# Patient Record
Sex: Female | Born: 1970 | Race: White | Hispanic: No | Smoking: Never smoker
Health system: Southern US, Community
[De-identification: ages and names within clinical notes are randomized; demographics above are authoritative.]

## PROBLEM LIST (undated history)

## (undated) DIAGNOSIS — M5441 Lumbago with sciatica, right side: Secondary | ICD-10-CM

## (undated) DIAGNOSIS — F418 Other specified anxiety disorders: Secondary | ICD-10-CM

## (undated) DIAGNOSIS — F1110 Opioid abuse, uncomplicated: Secondary | ICD-10-CM

## (undated) DIAGNOSIS — B1920 Unspecified viral hepatitis C without hepatic coma: Secondary | ICD-10-CM

## (undated) DIAGNOSIS — M7022 Olecranon bursitis, left elbow: Secondary | ICD-10-CM

## (undated) DIAGNOSIS — K7460 Unspecified cirrhosis of liver: Secondary | ICD-10-CM

## (undated) DIAGNOSIS — Z780 Asymptomatic menopausal state: Secondary | ICD-10-CM

## (undated) DIAGNOSIS — F319 Bipolar disorder, unspecified: Secondary | ICD-10-CM

## (undated) DIAGNOSIS — Z9289 Personal history of other medical treatment: Secondary | ICD-10-CM

## (undated) DIAGNOSIS — Z789 Other specified health status: Secondary | ICD-10-CM

## (undated) DIAGNOSIS — Z7289 Other problems related to lifestyle: Secondary | ICD-10-CM

## (undated) DIAGNOSIS — Z862 Personal history of diseases of the blood and blood-forming organs and certain disorders involving the immune mechanism: Secondary | ICD-10-CM

## (undated) DIAGNOSIS — K7031 Alcoholic cirrhosis of liver with ascites: Secondary | ICD-10-CM

## (undated) DIAGNOSIS — K852 Alcohol induced acute pancreatitis without necrosis or infection: Secondary | ICD-10-CM

## (undated) DIAGNOSIS — I8500 Esophageal varices without bleeding: Secondary | ICD-10-CM

## (undated) DIAGNOSIS — R45851 Suicidal ideations: Secondary | ICD-10-CM

## (undated) DIAGNOSIS — S0990XA Unspecified injury of head, initial encounter: Secondary | ICD-10-CM

## (undated) DIAGNOSIS — I8510 Secondary esophageal varices without bleeding: Secondary | ICD-10-CM

## (undated) DIAGNOSIS — F32A Depression, unspecified: Secondary | ICD-10-CM

## (undated) DIAGNOSIS — D696 Thrombocytopenia, unspecified: Secondary | ICD-10-CM

## (undated) DIAGNOSIS — Z87442 Personal history of urinary calculi: Secondary | ICD-10-CM

## (undated) DIAGNOSIS — F209 Schizophrenia, unspecified: Secondary | ICD-10-CM

## (undated) DIAGNOSIS — G43909 Migraine, unspecified, not intractable, without status migrainosus: Secondary | ICD-10-CM

## (undated) DIAGNOSIS — K920 Hematemesis: Secondary | ICD-10-CM

## (undated) DIAGNOSIS — F329 Major depressive disorder, single episode, unspecified: Secondary | ICD-10-CM

## (undated) DIAGNOSIS — F1020 Alcohol dependence, uncomplicated: Secondary | ICD-10-CM

## (undated) DIAGNOSIS — R569 Unspecified convulsions: Secondary | ICD-10-CM

## (undated) DIAGNOSIS — K219 Gastro-esophageal reflux disease without esophagitis: Secondary | ICD-10-CM

## (undated) DIAGNOSIS — M5442 Lumbago with sciatica, left side: Secondary | ICD-10-CM

## (undated) DIAGNOSIS — F419 Anxiety disorder, unspecified: Secondary | ICD-10-CM

## (undated) HISTORY — PX: BUNIONECTOMY: SHX129

## (undated) HISTORY — PX: TUBAL LIGATION: SHX77

## (undated) HISTORY — PX: FINGER FRACTURE SURGERY: SHX638

## (undated) HISTORY — DX: Other problems related to lifestyle: Z72.89

## (undated) HISTORY — PX: SHOULDER OPEN ROTATOR CUFF REPAIR: SHX2407

## (undated) HISTORY — PX: VAGINAL HYSTERECTOMY: SUR661

## (undated) HISTORY — DX: Other specified health status: Z78.9

## (undated) HISTORY — PX: FRACTURE SURGERY: SHX138

## (undated) HISTORY — DX: Personal history of diseases of the blood and blood-forming organs and certain disorders involving the immune mechanism: Z86.2

## (undated) MED FILL — Romiplostim For Inj 250 MCG: SUBCUTANEOUS | Qty: 0.5 | Status: AC

## (undated) MED FILL — Ferumoxytol Inj 510 MG/17ML (30 MG/ML) (Elemental Fe): INTRAVENOUS | Qty: 17 | Status: AC

## (undated) MED FILL — Romiplostim For Inj 250 MCG: SUBCUTANEOUS | Qty: 0.75 | Status: AC

## (undated) MED FILL — Romiplostim For Inj 250 MCG: SUBCUTANEOUS | Qty: 0.38 | Status: AC

## (undated) MED FILL — Romiplostim For Inj 500 MCG: SUBCUTANEOUS | Qty: 0.75 | Status: AC

## (undated) MED FILL — Romiplostim For Inj 250 MCG: SUBCUTANEOUS | Qty: 0.39 | Status: AC

## (undated) MED FILL — Romiplostim For Inj 125 MCG: SUBCUTANEOUS | Qty: 0.75 | Status: AC

---
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
## 2014-08-28 DIAGNOSIS — R1084 Generalized abdominal pain: Secondary | ICD-10-CM

## 2014-08-28 NOTE — ED Notes (Signed)
Patient reports vomiting all day.  Chest pain started about noon.  Pain in left upper chest and is throbbing in nature.  Reports abuses ETOH and none since last night.  Diagnosed with cirrhosis and Hep C earlier this year.  States she was also told she has liver and pancreatic CA, but never went for treatment.  Anxious, shaking, tachycardic, and hypertensive in triage.  States has not taken seizure medication because she left it in [place], where she lives.  Admits does not take seizure medication regularly.

## 2014-08-29 ENCOUNTER — Inpatient Hospital Stay
Admit: 2014-08-29 | Discharge: 2014-08-29 | Disposition: A | Discharge status: Home / Self Care | Payer: Self-pay | Attending: Emergency Medicine

## 2014-08-29 ENCOUNTER — Emergency Department: Admit: 2014-08-29 | Payer: Self-pay

## 2014-08-29 LAB — CBC WITH AUTOMATED DIFF
ABS. BASOPHILS: 0 10*3/uL (ref 0.0–0.2)
ABS. EOSINOPHILS: 0 10*3/uL (ref 0.0–0.8)
ABS. IMM. GRANS.: 0 10*3/uL (ref 0.0–0.5)
ABS. LYMPHOCYTES: 0.5 10*3/uL (ref 0.5–4.6)
ABS. MONOCYTES: 0.4 10*3/uL (ref 0.1–1.3)
ABS. NEUTROPHILS: 3.8 10*3/uL (ref 1.7–8.2)
BASOPHILS: 0 % (ref 0.0–2.0)
EOSINOPHILS: 0 % — ABNORMAL LOW (ref 0.5–7.8)
HCT: 47.6 % — ABNORMAL HIGH (ref 35.8–46.3)
HGB: 16.3 g/dL — ABNORMAL HIGH (ref 11.7–15.4)
IMMATURE GRANULOCYTES: 0.2 % (ref 0.0–5.0)
LYMPHOCYTES: 10 % — ABNORMAL LOW (ref 13–44)
MCH: 35 PG — ABNORMAL HIGH (ref 26.1–32.9)
MCHC: 34.2 g/dL (ref 31.4–35.0)
MCV: 102.1 FL — ABNORMAL HIGH (ref 79.6–97.8)
MONOCYTES: 9 % (ref 4.0–12.0)
MPV: 12.9 FL (ref 10.8–14.1)
NEUTROPHILS: 81 % — ABNORMAL HIGH (ref 43–78)
PLATELET: 27 10*3/uL — CL (ref 150–450)
RBC: 4.66 M/uL (ref 4.05–5.25)
RDW: 13.2 % (ref 11.9–14.6)
WBC: 4.7 10*3/uL (ref 4.3–11.1)

## 2014-08-29 LAB — EKG, 12 LEAD, INITIAL
Atrial Rate: 104 {beats}/min
Calculated P Axis: 87 degrees
Calculated R Axis: 86 degrees
Calculated T Axis: 74 degrees
P-R Interval: 126 ms
Q-T Interval: 508 ms
QRS Duration: 74 ms
QTC Calculation (Bezet): 668 ms
Ventricular Rate: 104 {beats}/min

## 2014-08-29 LAB — DRUG SCREEN, URINE
AMPHETAMINES: NEGATIVE
BARBITURATES: NEGATIVE
BENZODIAZEPINES: NEGATIVE
COCAINE: POSITIVE
METHADONE: NEGATIVE
OPIATES: NEGATIVE
PCP(PHENCYCLIDINE): NEGATIVE
THC (TH-CANNABINOL): NEGATIVE

## 2014-08-29 LAB — HCG URINE, QL: HCG urine, QL: NEGATIVE

## 2014-08-29 LAB — URINALYSIS W/ RFLX MICROSCOPIC
Glucose: NEGATIVE mg/dL
Ketone: 15 mg/dL — AB
Nitrites: NEGATIVE
Protein: 30 mg/dL — AB
Specific gravity: 1.019 (ref 1.001–1.023)
Urobilinogen: 1 EU/dL (ref 0.2–1.0)
pH (UA): 7.5 (ref 5.0–9.0)

## 2014-08-29 LAB — METABOLIC PANEL, COMPREHENSIVE
A-G Ratio: 0.9 — ABNORMAL LOW (ref 1.2–3.5)
ALT (SGPT): 172 U/L — ABNORMAL HIGH (ref 12–65)
AST (SGOT): 380 U/L — ABNORMAL HIGH (ref 15–37)
Albumin: 4.5 g/dL (ref 3.5–5.0)
Alk. phosphatase: 189 U/L — ABNORMAL HIGH (ref 50–136)
Anion gap: 13 mmol/L (ref 7–16)
BUN: 6 MG/DL (ref 6–23)
Bilirubin, total: 2.6 MG/DL — ABNORMAL HIGH (ref 0.2–1.1)
CO2: 29 mmol/L (ref 21–32)
Calcium: 8.1 MG/DL — ABNORMAL LOW (ref 8.3–10.4)
Chloride: 97 mmol/L — ABNORMAL LOW (ref 98–107)
Creatinine: 0.67 MG/DL (ref 0.6–1.0)
GFR est AA: >60 mL/min/{1.73_m2} (ref >60)
GFR est non-AA: >60 mL/min/{1.73_m2} (ref >60)
Globulin: 5 g/dL — ABNORMAL HIGH (ref 2.3–3.5)
Glucose: 146 mg/dL — ABNORMAL HIGH (ref 65–100)
Potassium: 3 mmol/L — ABNORMAL LOW (ref 3.5–5.1)
Protein, total: 9.5 g/dL — ABNORMAL HIGH (ref 6.3–8.2)
Sodium: 139 mmol/L (ref 136–145)

## 2014-08-29 LAB — ETHYL ALCOHOL: ALCOHOL(ETHYL),SERUM: <3 MG/DL

## 2014-08-29 LAB — MAGNESIUM: Magnesium: 1 mg/dL — CL (ref 1.8–2.4)

## 2014-08-29 LAB — LIPASE: Lipase: 111 U/L (ref 73–393)

## 2014-08-29 LAB — POC TROPONIN: Troponin-I (POC): 0 ng/ml (ref 0.0–0.08)

## 2014-08-29 MED ORDER — LORAZEPAM 2 MG/ML IJ SOLN
2 mg/mL | INTRAMUSCULAR | Status: AC
Start: 2014-08-29 — End: 2014-08-29
  Administered 2014-08-29: 07:00:00 via INTRAVENOUS

## 2014-08-29 MED ORDER — SODIUM CHLORIDE 0.9% BOLUS IV
0.9 % | Freq: Once | INTRAVENOUS | Status: AC
Start: 2014-08-29 — End: 2014-08-29
  Administered 2014-08-29: 06:00:00 via INTRAVENOUS

## 2014-08-29 MED ORDER — LORAZEPAM 1 MG TAB
1 mg | ORAL_TABLET | Freq: Three times a day (TID) | ORAL | Status: AC | PRN
Start: 2014-08-29 — End: ?

## 2014-08-29 MED ORDER — MAGNESIUM SULFATE 4 GRAM/100 ML IV PIGGY BACK
4 gram/100 mL ( %) | Freq: Once | INTRAVENOUS | Status: AC
Start: 2014-08-29 — End: 2014-08-29
  Administered 2014-08-29: 07:00:00 via INTRAVENOUS

## 2014-08-29 MED ORDER — SODIUM CHLORIDE 0.9 % IJ SYRG
INTRAMUSCULAR | Status: DC | PRN
Start: 2014-08-29 — End: 2014-08-29

## 2014-08-29 MED ORDER — LORAZEPAM 2 MG/ML IJ SOLN
2 mg/mL | INTRAMUSCULAR | Status: DC
Start: 2014-08-29 — End: 2014-08-29

## 2014-08-29 MED ORDER — ONDANSETRON (PF) 4 MG/2 ML INJECTION
4 mg/2 mL | INTRAMUSCULAR | Status: AC
Start: 2014-08-29 — End: 2014-08-29
  Administered 2014-08-29: 06:00:00 via INTRAVENOUS

## 2014-08-29 MED ORDER — MAGNESIUM OXIDE 400 MG TAB
400 mg | ORAL_TABLET | Freq: Every day | ORAL | Status: AC
Start: 2014-08-29 — End: ?

## 2014-08-29 MED ORDER — HYOSCYAMINE 0.125 MG SUBLINGUAL TAB
0.125 mg | SUBLINGUAL | Status: AC
Start: 2014-08-29 — End: 2014-08-29
  Administered 2014-08-29: 06:00:00 via SUBLINGUAL

## 2014-08-29 MED ORDER — SODIUM CHLORIDE 0.9 % IJ SYRG
Freq: Three times a day (TID) | INTRAMUSCULAR | Status: DC
Start: 2014-08-29 — End: 2014-08-29

## 2014-08-29 MED ORDER — PANTOPRAZOLE 40 MG IV SOLR
40 mg | Freq: Once | INTRAVENOUS | Status: AC
Start: 2014-08-29 — End: 2014-08-29
  Administered 2014-08-29: 06:00:00 via INTRAVENOUS

## 2014-08-29 MED FILL — LORAZEPAM 2 MG/ML IJ SOLN: 2 mg/mL | INTRAMUSCULAR | Qty: 1

## 2014-08-29 MED FILL — MAGNESIUM SULFATE 4 GRAM/100 ML IV PIGGY BACK: 4 gram/100 mL ( %) | INTRAVENOUS | Qty: 100

## 2014-08-29 MED FILL — SODIUM CHLORIDE 0.9 % INJECTION: INTRAMUSCULAR | Qty: 10

## 2014-08-29 MED FILL — ONDANSETRON (PF) 4 MG/2 ML INJECTION: 4 mg/2 mL | INTRAMUSCULAR | Qty: 2

## 2014-08-29 MED FILL — HYOSCYAMINE 0.125 MG SUBLINGUAL TAB: 0.125 mg | SUBLINGUAL | Qty: 1

## 2014-08-29 NOTE — ED Notes (Signed)
Report given to Erin RN. Care transferred at this time

## 2014-08-29 NOTE — ED Provider Notes (Signed)
HPI Comments: 43 year old female complaining of nausea vomiting epigastric and chest pain.  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  She's been drinking today however has been throwing up most of what she drinks.  She is having some epigastric chest pain after the vomiting.  Patient is from [place] and has not been here before.    Patient is a 43 y.o. female presenting with vomiting and chest pain. The history is provided by the patient.   Vomiting   This is a chronic problem. The current episode started more than 2 days ago. The problem has not changed since onset.The emesis has an appearance of stomach contents. There has been no fever. Associated symptoms include abdominal pain, diarrhea and myalgias. Pertinent negatives include no chills, no fever and no sweats. Her pertinent negatives include no irritable bowel syndrome, no recent abdominal surgery and no malabsorption.   Chest Pain (Angina)   Associated symptoms include abdominal pain and vomiting. Pertinent negatives include no fever.        Past Medical History:   Diagnosis Date   ??? Seizures (HCC)    ??? Infectious disease      Hep C   ??? Cirrhosis (HCC)    ??? Cancer (HCC)      liver and pancreatic       Past Surgical History:   Procedure Laterality Date   ??? Hx orthopaedic       right shoulder         History reviewed. No pertinent family history.    History     Social History   ??? Marital Status: MARRIED     Spouse Name: N/A     Number of Children: N/A   ??? Years of Education: N/A     Occupational History   ??? Not on file.     Social History Main Topics   ??? Smoking status: Never Smoker    ??? Smokeless tobacco: Not on file   ??? Alcohol Use: Yes      Comment: "at least a gallon of vodka a day"   ??? Drug Use: Yes     Special: Cocaine   ??? Sexual Activity: Not on file     Other Topics Concern   ??? Not on file     Social History Narrative   ??? No narrative on file                 ALLERGIES: Review of patient's allergies indicates no known allergies.      Review of Systems   Constitutional: Negative.  Negative for fever, chills and activity change.   HENT: Negative.    Eyes: Negative.    Respiratory: Negative.    Cardiovascular: Positive for chest pain.   Gastrointestinal: Positive for vomiting, abdominal pain and diarrhea.   Genitourinary: Negative.    Musculoskeletal: Positive for myalgias.   Skin: Negative.    Neurological: Negative.    Psychiatric/Behavioral: Negative.    All other systems reviewed and are negative.      Filed Vitals:    08/28/14 2339   BP: 134/108   Pulse: 119   Temp: 98.3 ??F (36.8 ??C)   Resp: 20   Height: 5' (1.524 m)   Weight: 56.7 kg (125 lb)   SpO2: 96%            Physical Exam   Constitutional: She is oriented to person, place, and time. She appears well-developed and well-nourished. No  distress.   HENT:   Head: Normocephalic and atraumatic.   Right Ear: External ear normal.   Left Ear: External ear normal.   Nose: Nose normal.   Mouth/Throat: Oropharynx is clear and moist. No oropharyngeal exudate.   Eyes: Conjunctivae and EOM are normal. Pupils are equal, round, and reactive to light. Right eye exhibits no discharge. Left eye exhibits no discharge. No scleral icterus.   Neck: Normal range of motion. Neck supple. No JVD present. No tracheal deviation present.   Cardiovascular: Normal rate, regular rhythm and intact distal pulses.    Pulmonary/Chest: Effort normal and breath sounds normal. No stridor. No respiratory distress. She has no wheezes. She exhibits no tenderness.   Abdominal: Soft. Bowel sounds are normal. She exhibits no distension and no mass. There is no tenderness.   Musculoskeletal: Normal range of motion. She exhibits no edema or tenderness.   Neurological: She is alert and oriented to person, place, and time. No cranial nerve deficit.   Skin: Skin is warm and dry. No rash noted. She is not diaphoretic. No erythema. No pallor.    Psychiatric: She has a normal mood and affect. Her behavior is normal. Thought content normal.   Nursing note and vitals reviewed.       MDM  Number of Diagnoses or Management Options  Abdominal pain, generalized: established and worsening  Alcoholic hepatitis without ascites: established and worsening  Hypomagnesemia: established and worsening  Polysubstance abuse: established and worsening  Diagnosis management comments: Patient resting comfortably in the room for several hours she slept she was easily to awaken without complaints of nausea.  She will is from out of town but will be set up with a local GI specialist for further workup of her liver dysfunction.  She is also encouraged to seek help for her substance abuse and alcoholism.       Amount and/or Complexity of Data Reviewed  Clinical lab tests: ordered and reviewed  Tests in the radiology section of CPT??: ordered and reviewed  Tests in the medicine section of CPT??: ordered and reviewed    Risk of Complications, Morbidity, and/or Mortality  Presenting problems: high  Diagnostic procedures: high  Management options: high    Patient Progress  Patient progress: stable      Procedures

## 2014-08-29 NOTE — ED Notes (Signed)
I have reviewed discharge instructions with the patient.  The patient verbalized understanding. Patient appears in no acute distress upon discharge.

## 2014-08-29 NOTE — ED Notes (Signed)
Patient resting quietly in room. Respirations are even and unlabored. Patient appears in no acute distress at this time.

## 2014-09-09 DIAGNOSIS — Z780 Asymptomatic menopausal state: Secondary | ICD-10-CM

## 2014-09-09 HISTORY — DX: Asymptomatic menopausal state: Z78.0

## 2016-09-09 DIAGNOSIS — B1920 Unspecified viral hepatitis C without hepatic coma: Secondary | ICD-10-CM

## 2016-09-09 HISTORY — DX: Unspecified viral hepatitis C without hepatic coma: B19.20

## 2017-05-26 ENCOUNTER — Inpatient Hospital Stay
Admit: 2017-05-26 | Discharge: 2017-06-06 | Disposition: A | Discharge status: Home / Self Care | Payer: Self-pay | Attending: Family Medicine | Admitting: Family Medicine

## 2017-05-26 DIAGNOSIS — F10239 Alcohol dependence with withdrawal, unspecified: Principal | ICD-10-CM

## 2017-05-26 LAB — CBC WITH AUTOMATED DIFF
ABS. BASOPHILS: 0 10*3/uL (ref 0.0–0.2)
ABS. EOSINOPHILS: 0.1 10*3/uL (ref 0.0–0.8)
ABS. IMM. GRANS.: 0 10*3/uL (ref 0.0–0.5)
ABS. LYMPHOCYTES: 1.5 10*3/uL (ref 0.5–4.6)
ABS. MONOCYTES: 0.6 10*3/uL (ref 0.1–1.3)
ABS. NEUTROPHILS: 2.2 10*3/uL (ref 1.7–8.2)
ABSOLUTE NRBC: 0 10*3/uL (ref 0.0–0.2)
BASOPHILS: 1 % (ref 0.0–2.0)
EOSINOPHILS: 2 % (ref 0.5–7.8)
HCT: 39.4 % (ref 35.8–46.3)
HGB: 13 g/dL (ref 11.7–15.4)
IMMATURE GRANULOCYTES: 1 % (ref 0.0–5.0)
LYMPHOCYTES: 34 % (ref 13–44)
MCH: 32.2 PG (ref 26.1–32.9)
MCHC: 33 g/dL (ref 31.4–35.0)
MCV: 97.5 FL (ref 79.6–97.8)
MONOCYTES: 14 % — ABNORMAL HIGH (ref 4.0–12.0)
MPV: UNDETERMINED FL (ref 9.4–12.3)
NEUTROPHILS: 48 % (ref 43–78)
PLATELET: 15 10*3/uL — CL (ref 150–450)
RBC: 4.04 M/uL — ABNORMAL LOW (ref 4.05–5.2)
RDW: 15.9 %
WBC: 4.4 10*3/uL (ref 4.3–11.1)

## 2017-05-26 LAB — METABOLIC PANEL, COMPREHENSIVE
A-G Ratio: 0.5 — ABNORMAL LOW (ref 1.2–3.5)
ALT (SGPT): 39 U/L (ref 12–65)
AST (SGOT): 163 U/L — ABNORMAL HIGH (ref 15–37)
Albumin: 2.9 g/dL — ABNORMAL LOW (ref 3.5–5.0)
Alk. phosphatase: 207 U/L — ABNORMAL HIGH (ref 50–136)
Anion gap: 11 mmol/L (ref 7–16)
BUN: 5 MG/DL — ABNORMAL LOW (ref 6–23)
Bilirubin, total: 1.3 MG/DL — ABNORMAL HIGH (ref 0.2–1.1)
CO2: 24 mmol/L (ref 21–32)
Calcium: 8 MG/DL — ABNORMAL LOW (ref 8.3–10.4)
Chloride: 110 mmol/L — ABNORMAL HIGH (ref 98–107)
Creatinine: 0.66 MG/DL (ref 0.6–1.0)
GFR est AA: >60 mL/min/{1.73_m2} (ref >60)
GFR est non-AA: >60 mL/min/{1.73_m2} (ref >60)
Globulin: 5.6 g/dL — ABNORMAL HIGH (ref 2.3–3.5)
Glucose: 101 mg/dL — ABNORMAL HIGH (ref 65–100)
Potassium: 4.3 mmol/L (ref 3.5–5.1)
Protein, total: 8.5 g/dL — ABNORMAL HIGH (ref 6.3–8.2)
Sodium: 145 mmol/L (ref 136–145)

## 2017-05-26 LAB — ETHYL ALCOHOL: ALCOHOL(ETHYL),SERUM: 409 MG/DL — CR

## 2017-05-26 MED ORDER — LORAZEPAM 1 MG TAB
1 mg | ORAL | Status: AC
Start: 2017-05-26 — End: 2017-05-26
  Administered 2017-05-26: 21:00:00 via ORAL

## 2017-05-26 MED FILL — LORAZEPAM 1 MG TAB: 1 mg | ORAL | Qty: 2

## 2017-05-26 NOTE — ED Notes (Signed)
Pt able to answer questions, is A&Ox4, states she still feels like she's going to have a seizure.

## 2017-05-26 NOTE — ED Triage Notes (Addendum)
Pt arrived via ems because she thinks she is going to have a seizure and is going thru a lot of stress. Pt has a script for the stress but has not filled it yet. Pt states her boyfriend dropped dead right in front of her and she cant take the stress.

## 2017-05-26 NOTE — ED Provider Notes (Addendum)
HPI Comments: 46 year old female with history of alcohol abuse, hepatitis C, cirrhosis and seizures presents to the emergency department because she feels like she might have a seizure because she's been going through a great deal of stress lately.  She reports that last month her boyfriend died in front of her.  It sounds like she was hospitalized in [place] and then came up to [place] to stay with a friend.  She has not been taking much of her medications.  She reports she drinks alcohol every day.  She told me she wants to jump off a bridge.  She became very angry with me for unknown reasons and began yelling in the emergency department. Patient appears intoxicated    Patient is a 46 y.o. female presenting with mental health disorder. The history is provided by the patient. No language interpreter was used.   Mental Health Problem    Pertinent negatives include no confusion.        Past Medical History:   Diagnosis Date   ??? Cancer ([place])     liver and pancreatic   ??? Cirrhosis ([place])    ??? Infectious disease     Hep C   ??? Seizures (HCC)        Past Surgical History:   Procedure Laterality Date   ??? HX ORTHOPAEDIC      right shoulder         No family history on file.    Social History     Social History   ??? Marital status: MARRIED     Spouse name: N/A   ??? Number of children: N/A   ??? Years of education: N/A     Occupational History   ??? Not on file.     Social History Main Topics   ??? Smoking status: Never Smoker   ??? Smokeless tobacco: Not on file   ??? Alcohol use Yes      Comment: "at least a gallon of vodka a day"   ??? Drug use: Yes     Special: Cocaine   ??? Sexual activity: Not on file     Other Topics Concern   ??? Not on file     Social History Narrative   ??? No narrative on file         ALLERGIES: Review of patient's allergies indicates no known allergies.    Review of Systems   Constitutional: Negative for fever.   HENT: Negative for sore throat.    Respiratory: Negative for shortness of breath.     Cardiovascular: Negative for leg swelling.   Gastrointestinal: Negative for abdominal pain.   Genitourinary: Negative for dysuria.   Musculoskeletal: Negative for back pain.   Neurological: Negative for syncope and headaches.   Psychiatric/Behavioral: Positive for suicidal ideas. Negative for confusion. The patient is nervous/anxious.        Vitals:    05/26/17 1553   BP: 116/88   Pulse: 97   Resp: 16   Temp: 98.3 ??F (36.8 ??C)   SpO2: 95%   Weight: 56.7 kg (125 lb)   Height: 5' (1.524 m)            Physical Exam   Constitutional: She is oriented to person, place, and time. She appears well-developed and well-nourished. No distress.   Intoxicated appearing   HENT:   Head: Normocephalic and atraumatic.   Eyes: Conjunctivae and EOM are normal. Pupils are equal, round, and reactive to light.   Neck: Normal range of motion.  Neck supple.   Cardiovascular: Normal rate, regular rhythm and normal heart sounds.    Pulmonary/Chest: Effort normal and breath sounds normal. No respiratory distress. She has no wheezes. She has no rales.   Abdominal: Soft. She exhibits no distension. There is no tenderness. There is no rebound.   Musculoskeletal: Normal range of motion. She exhibits no edema or tenderness.   Neurological: She is alert and oriented to person, place, and time.   Skin: Skin is warm and dry. No rash noted. She is not diaphoretic.   Psychiatric:   Emotionally labile.  Stating that she would jump off a bridge to harm herself   Vitals reviewed.       MDM  Number of Diagnoses or Management Options  Alcoholic intoxication with complication [facility]): new and requires workup  Liver failure without hepatic coma, unspecified chronicity ([place]): new and requires workup  Suicidal ideation: new and requires workup  Diagnosis management comments: Patient yelling in the emergency department.  She was given 2 mg of oral Ativan with good results.  Acutely intoxicated.  She has chronic liver disease and thrombocytopenia.  Unknown baseline.     Care everywhere viewing [facility] revealed no significant results. Patient needs psychiatric evaluation possibly medical admission.  Signed out to oncoming provider circle.      Fleet Contras, MD; 05/26/2017 '@6'$ :67 PM Voice dictation software was used during the making of this note.  This software is not perfect and grammatical and other typographical errors may be present.  This note has not been proofread for errors.  ===================================================================         Amount and/or Complexity of Data Reviewed  Clinical lab tests: reviewed and ordered (Results for orders placed or performed during the hospital encounter of 05/26/17  -CBC WITH AUTOMATED DIFF       Result                                            Value                         Ref Range                       WBC                                               4.4                           4.3 - 11.1 K/uL                 RBC                                               4.04 (L)                      4.05 - 5.2 M/uL                 HGB  13.0                          11.7 - 15.4 g/dL                HCT                                               39.4                          35.8 - 46.3 %                   MCV                                               97.5                          79.6 - 97.8 FL                  MCH                                               32.2                          26.1 - 32.9 PG                  MCHC                                              33.0                          31.4 - 35.0 g/dL                RDW                                               15.9                          %                               PLATELET                                          15 (LL)                       150 - 450 K/uL  MPV                                                                             9.4 - 12.3 FL                Unable to calculate. Recommend adding IPF.       ABSOLUTE NRBC                                     0.00                          0.0 - 0.2 K/uL                  NEUTROPHILS                                       48                            43 - 78 %                       LYMPHOCYTES                                       34                            13 - 44 %                       MONOCYTES                                         14 (H)                        4.0 - 12.0 %                    EOSINOPHILS                                       2                             0.5 - 7.8 %                     BASOPHILS  1                             0.0 - 2.0 %                     IMMATURE GRANULOCYTES                             1                             0.0 - 5.0 %                     ABS. NEUTROPHILS                                  2.2                           1.7 - 8.2 K/UL                  ABS. LYMPHOCYTES                                  1.5                           0.5 - 4.6 K/UL                  ABS. MONOCYTES                                    0.6                           0.1 - 1.3 K/UL                  ABS. EOSINOPHILS                                  0.1                           0.0 - 0.8 K/UL                  ABS. BASOPHILS                                    0.0                           0.0 - 0.2 K/UL                  ABS. IMM. GRANS.                                  0.0  0.0 - 0.5 K/UL                  RBC COMMENTS                                                                                                SLIGHT   ANISOCYTOSIS + POIKILOCYTOSIS          RBC COMMENTS                                      MODERATE TARGET CELLS                                         WBC COMMENTS                                      Result Confirmed By Smear                                      PLATELET COMMENTS                                 MARKED                                                        DF                                                AUTOMATED                                                -METABOLIC PANEL, COMPREHENSIVE       Result                                            Value                         Ref Range                       Sodium  145                           136 - 145 mmol/L                Potassium                                         4.3                           3.5 - 5.1 mmol/L                Chloride                                          110 (H)                       98 - 107 mmol/L                 CO2                                               24                            21 - 32 mmol/L                  Anion gap                                         11                            7 - 16 mmol/L                   Glucose                                           101 (H)                       65 - 100 mg/dL                  BUN                                               5 (L)                         6 - 23 MG/DL                    Creatinine  0.66                          0.6 - 1.0 MG/DL                 GFR est AA                                        >60                           >60 ml/min/1.15m               GFR est non-AA                                    >60                           >60 ml/min/1.7102m              Calcium                                           8.0 (L)                       8.3 - 10.4 MG/DL                Bilirubin, total                                  1.3 (H)                       0.2 - 1.1 MG/DL                 ALT (SGPT)                                        39                            12 - 65 U/L                     AST (SGOT)                                        163 (H)                       15 - 37 U/L                      Alk. phosphatase                                  207 (H)  50 - 136 U/L                    Protein, total                                    8.5 (H)                       6.3 - 8.2 g/dL                  Albumin                                           2.9 (L)                       3.5 - 5.0 g/dL                  Globulin                                          5.6 (H)                       2.3 - 3.5 g/dL                  A-G Ratio                                         0.5 (L)                       1.2 - 3.5                  -ETHYL ALCOHOL       Result                                            Value                         Ref Range                       ALCOHOL(ETHYL),SERUM                              409 (HH)                      MG/DL                      -EKG, 12 LEAD, INITIAL       Result                                            Value  Ref Range                       Ventricular Rate                                  70                            BPM                             Atrial Rate                                       70                            BPM                             P-R Interval                                      172                           ms                              QRS Duration                                      84                            ms                              Q-T Interval                                      428                           ms                              QTC Calculation (Bezet)                           462                           ms                              Calculated P Axis  76                            degrees                         Calculated R Axis                                 92                            degrees                         Calculated T Axis                                 64                            degrees                          Diagnosis                                                                                                   Normal sinus rhythm   Rightward axis   Borderline ECG   When compared with ECG of 28-Aug-2014 23:41,   Vent. rate has decreased BY  34 BPM   QT has shortened     )  Review and summarize past medical records: yes  Independent visualization of images, tracings, or specimens: yes    Risk of Complications, Morbidity, and/or Mortality  Presenting problems: high  Diagnostic procedures: moderate  Management options: moderate    Patient Progress  Patient progress: stable    ===================================================================  [facility] Psychiatric RECHECK NOTE for 05/27/2017    Courtney Garcia is a 46 y.o. female here for depression, homelessness.   ---she arrived on 05/26/2017   ---she is not on papers  ---she has completed a tele-psych evaluation    Subjective:-year-old female history of alcohol abuse presents to the emergency department with increased alcohol abuse, depression, vague suicidal ideations    Patient states that her boyfriend died at a barbecue suddenly.  Was eating and then fell out of his chair died about a month ago.  She came to live with friends.  Stay with them any longer.    She's been drinking more heavily for the last several days came in last night blood alcohol of 400 at 5:00 in the afternoon.    She is reassessed this morning    Patient continues to report some vague suicidal ideations and depressive symptoms.  More impressively she has significant ecchymosis across her abdomen and into her buttock.  She states that she's had bleeding from her nose or  gums and her ears.  She bruises very easily.    Objective:  Vitals:    05/26/17 1553   BP: 116/88   Pulse: 97   Resp: 16   Temp: 98.3 ??F (36.8 ??C)   SpO2: 95%     Patient is awake alert oriented.  She answers questions follows commands    Respirations are easy and nonlabored     She moves all extremities.  Tongue and facial movements are intact.    She has significant ecchymosis of the abdomen, trunk, buttocks  Recent Results (from the past 8 hour(s))   CBC W/O DIFF    Collection Time: 05/27/17  9:07 AM   Result Value Ref Range    WBC 3.3 (L) 4.3 - 11.1 K/uL    RBC 4.31 4.05 - 5.2 M/uL    HGB 13.8 11.7 - 15.4 g/dL    HCT 41.5 35.8 - 46.3 %    MCV 96.3 79.6 - 97.8 FL    MCH 32.0 26.1 - 32.9 PG    MCHC 33.3 31.4 - 35.0 g/dL    RDW 15.5 %    PLATELET 7 (LL) 150 - 450 K/uL    MPV 12.9 (H) 9.4 - 12.3 FL    ABSOLUTE NRBC 0.00 0.0 - 0.2 K/uL   METABOLIC PANEL, COMPREHENSIVE    Collection Time: 05/27/17  9:07 AM   Result Value Ref Range    Sodium 139 136 - 145 mmol/L    Potassium 3.5 3.5 - 5.1 mmol/L    Chloride 106 98 - 107 mmol/L    CO2 22 21 - 32 mmol/L    Anion gap 11 7 - 16 mmol/L    Glucose 96 65 - 100 mg/dL    BUN 4 (L) 6 - 23 MG/DL    Creatinine 0.56 (L) 0.6 - 1.0 MG/DL    GFR est AA >60 >60 ml/min/1.68m    GFR est non-AA >60 >60 ml/min/1.725m   Calcium 8.1 (L) 8.3 - 10.4 MG/DL    Bilirubin, total 2.0 (H) 0.2 - 1.1 MG/DL    ALT (SGPT) 40 12 - 65 U/L    AST (SGOT) 157 (H) 15 - 37 U/L    Alk. phosphatase 196 (H) 50 - 136 U/L    Protein, total 8.6 (H) 6.3 - 8.2 g/dL    Albumin 2.9 (L) 3.5 - 5.0 g/dL    Globulin 5.7 (H) 2.3 - 3.5 g/dL    A-G Ratio 0.5 (L) 1.2 - 3.5     PROTHROMBIN TIME + INR    Collection Time: 05/27/17  9:07 AM   Result Value Ref Range    Prothrombin time 15.1 (H) 11.5 - 14.5 sec    INR 1.3     PREALBUMIN    Collection Time: 05/27/17  9:07 AM   Result Value Ref Range    Prealbumin 12.2 (L) 18.0 - 35.7 MG/DL     Repeat labs performed.  Platelets have dropped from 15---> 7000.  Her INR is slightly elevated at 1.3.  Patient is tremulous.  Tachycardic.    No current facility-administered medications for this encounter.     Current Outpatient Prescriptions:   ???  busPIRone (BUSPAR) 10 mg tablet, Take 10 mg by mouth three (3) times daily., Disp: , Rfl:    ???  magnesium oxide (MAG-OX) 400 mg tablet, Take 1 Tab by mouth daily., Disp: 10 Tab, Rfl: 0  ???  LORazepam (ATIVAN) 1 mg tablet, Take 1 Tab by mouth every eight (  8) hours as needed for Anxiety. Max Daily Amount: 3 mg., Disp: 15 Tab, Rfl: 0  ???  DIVALPROEX SODIUM (DEPAKOTE PO), Take  by mouth., Disp: , Rfl:     Assessment: alcohol withdrawal plus thrombocytopenia plus ecchymosis    Plan: admitted to the hospitalist service for withdrawal management     Mamie Nick, MD, 7:58 AM,05/27/2017==============================================        ED Course       Procedures

## 2017-05-27 ENCOUNTER — Inpatient Hospital Stay

## 2017-05-27 LAB — CBC W/O DIFF
ABSOLUTE NRBC: 0 10*3/uL (ref 0.0–0.2)
ABSOLUTE NRBC: 0 10*3/uL (ref 0.0–0.2)
HCT: 41.5 % (ref 35.8–46.3)
HCT: 36.5 % (ref 35.8–46.3)
HGB: 13.8 g/dL (ref 11.7–15.4)
HGB: 12.6 g/dL (ref 11.7–15.4)
MCH: 32 PG (ref 26.1–32.9)
MCH: 32.6 PG (ref 26.1–32.9)
MCHC: 33.3 g/dL (ref 31.4–35.0)
MCHC: 34.5 g/dL (ref 31.4–35.0)
MCV: 96.3 FL (ref 79.6–97.8)
MCV: 94.6 FL (ref 79.6–97.8)
MPV: 12.9 FL — ABNORMAL HIGH (ref 9.4–12.3)
MPV: 11.9 FL (ref 9.4–12.3)
PLATELET: 7 10*3/uL — CL (ref 150–450)
PLATELET: 11 10*3/uL — CL (ref 150–450)
RBC: 4.31 M/uL (ref 4.05–5.2)
RBC: 3.86 M/uL — ABNORMAL LOW (ref 4.05–5.2)
RDW: 15.5 %
RDW: 15.3 %
WBC: 3.3 10*3/uL — ABNORMAL LOW (ref 4.3–11.1)
WBC: 3.6 10*3/uL — ABNORMAL LOW (ref 4.3–11.1)

## 2017-05-27 LAB — URINE MICROSCOPIC
Bacteria: 0 /hpf
Mucus: 0 /lpf

## 2017-05-27 LAB — EKG, 12 LEAD, INITIAL
Atrial Rate: 70 {beats}/min
Calculated P Axis: 76 degrees
Calculated R Axis: 92 degrees
Calculated T Axis: 64 degrees
Diagnosis: NORMAL
P-R Interval: 172 ms
Q-T Interval: 428 ms
QRS Duration: 84 ms
QTC Calculation (Bezet): 462 ms
Ventricular Rate: 70 {beats}/min

## 2017-05-27 LAB — METABOLIC PANEL, COMPREHENSIVE
A-G Ratio: 0.5 — ABNORMAL LOW (ref 1.2–3.5)
ALT (SGPT): 40 U/L (ref 12–65)
AST (SGOT): 157 U/L — ABNORMAL HIGH (ref 15–37)
Albumin: 2.9 g/dL — ABNORMAL LOW (ref 3.5–5.0)
Alk. phosphatase: 196 U/L — ABNORMAL HIGH (ref 50–136)
Anion gap: 11 mmol/L (ref 7–16)
BUN: 4 MG/DL — ABNORMAL LOW (ref 6–23)
Bilirubin, total: 2 MG/DL — ABNORMAL HIGH (ref 0.2–1.1)
CO2: 22 mmol/L (ref 21–32)
Calcium: 8.1 MG/DL — ABNORMAL LOW (ref 8.3–10.4)
Chloride: 106 mmol/L (ref 98–107)
Creatinine: 0.56 MG/DL — ABNORMAL LOW (ref 0.6–1.0)
GFR est AA: >60 mL/min/{1.73_m2} (ref >60)
GFR est non-AA: >60 mL/min/{1.73_m2} (ref >60)
Globulin: 5.7 g/dL — ABNORMAL HIGH (ref 2.3–3.5)
Glucose: 96 mg/dL (ref 65–100)
Potassium: 3.5 mmol/L (ref 3.5–5.1)
Protein, total: 8.6 g/dL — ABNORMAL HIGH (ref 6.3–8.2)
Sodium: 139 mmol/L (ref 136–145)

## 2017-05-27 LAB — PREALBUMIN: Prealbumin: 12.2 MG/DL — ABNORMAL LOW (ref 18.0–35.7)

## 2017-05-27 LAB — PROTHROMBIN TIME + INR
INR: 1.3
Prothrombin time: 15.1 s — ABNORMAL HIGH (ref 11.5–14.5)

## 2017-05-27 LAB — AMMONIA: Ammonia, plasma: 34 umol/L — ABNORMAL HIGH (ref 11–32)

## 2017-05-27 LAB — HCG URINE, QL. - POC: Pregnancy test,urine (POC): NEGATIVE

## 2017-05-27 LAB — EKG 12-LEAD
Atrial Rate: 70 {beats}/min
Diagnosis: NORMAL
P Axis: 76 degrees
P-R Interval: 172 ms
Q-T Interval: 428 ms
QRS Duration: 84 ms
QTc Calculation (Bazett): 462 ms
R Axis: 92 degrees
T Axis: 64 degrees
Ventricular Rate: 70 {beats}/min

## 2017-05-27 MED ORDER — MULTIVITS,STRESS FORMULA-ZINC TAB
Freq: Every day | ORAL | Status: DC
Start: 2017-05-27 — End: 2017-06-06
  Administered 2017-05-27 – 2017-06-06 (×12): via ORAL

## 2017-05-27 MED ORDER — SODIUM CHLORIDE 0.9 % IJ SYRG
INTRAMUSCULAR | Status: DC | PRN
Start: 2017-05-27 — End: 2017-06-06

## 2017-05-27 MED ORDER — TOPIRAMATE 25 MG TAB
25 mg | Freq: Two times a day (BID) | ORAL | Status: DC
Start: 2017-05-27 — End: 2017-06-06
  Administered 2017-05-27 – 2017-06-06 (×20): via ORAL

## 2017-05-27 MED ORDER — LORAZEPAM 1 MG TAB
1 mg | ORAL | Status: DC
Start: 2017-05-27 — End: 2017-05-27
  Administered 2017-05-27: 20:00:00 via ORAL

## 2017-05-27 MED ORDER — PHENYTOIN SODIUM EXTENDED 100 MG CAP
100 mg | Freq: Every evening | ORAL | Status: DC
Start: 2017-05-27 — End: 2017-06-06
  Administered 2017-05-28 – 2017-06-06 (×10): via ORAL

## 2017-05-27 MED ORDER — FLU VACCINE QV 2018-19 (6 MOS+)(PF) 60 MCG (15 MCG X 4)/0.5 ML IM SYRINGE
60 mcg (15 mcg x 4)/0.5 mL | INTRAMUSCULAR | Status: AC
Start: 2017-05-27 — End: 2017-06-06
  Administered 2017-06-06: 15:00:00 via INTRAMUSCULAR

## 2017-05-27 MED ORDER — CHLORDIAZEPOXIDE 25 MG CAP
25 mg | ORAL | Status: AC
Start: 2017-05-27 — End: 2017-05-27
  Administered 2017-05-27: 14:00:00 via ORAL

## 2017-05-27 MED ORDER — LORAZEPAM 1 MG TAB
1 mg | ORAL | Status: AC
Start: 2017-05-27 — End: 2017-05-27
  Administered 2017-05-27 (×2): via ORAL

## 2017-05-27 MED ORDER — LORAZEPAM 1 MG TAB
1 mg | ORAL | Status: AC
Start: 2017-05-27 — End: 2017-05-27
  Administered 2017-05-27 (×2): via ORAL

## 2017-05-27 MED ORDER — THIAMINE HCL 100 MG TAB
100 mg | Freq: Every day | ORAL | Status: DC
Start: 2017-05-27 — End: 2017-06-06
  Administered 2017-05-28 – 2017-06-06 (×11): via ORAL

## 2017-05-27 MED ORDER — THIAMINE HCL 100 MG TAB
100 mg | ORAL | Status: AC
Start: 2017-05-27 — End: 2017-05-27
  Administered 2017-05-27: 14:00:00 via ORAL

## 2017-05-27 MED ORDER — TOPIRAMATE 25 MG TAB
25 mg | Freq: Two times a day (BID) | ORAL | Status: DC
Start: 2017-05-27 — End: 2017-05-27

## 2017-05-27 MED ORDER — LORAZEPAM 1 MG TAB
1 mg | ORAL | Status: DC
Start: 2017-05-27 — End: 2017-05-28
  Administered 2017-05-28 (×3): via ORAL

## 2017-05-27 MED ORDER — CHLORDIAZEPOXIDE 25 MG CAP
25 mg | Freq: Four times a day (QID) | ORAL | Status: DC
Start: 2017-05-27 — End: 2017-06-01
  Administered 2017-05-27 – 2017-06-01 (×22): via ORAL

## 2017-05-27 MED ORDER — LORAZEPAM 1 MG TAB
1 mg | ORAL | Status: DC
Start: 2017-05-27 — End: 2017-05-27

## 2017-05-27 MED ORDER — FOLIC ACID 1 MG TAB
1 mg | Freq: Every day | ORAL | Status: DC
Start: 2017-05-27 — End: 2017-06-06
  Administered 2017-05-28 – 2017-06-06 (×11): via ORAL

## 2017-05-27 MED ORDER — LORAZEPAM 1 MG TAB
1 mg | ORAL | Status: DC
Start: 2017-05-27 — End: 2017-05-28
  Administered 2017-05-27 – 2017-05-28 (×3): via ORAL

## 2017-05-27 MED ORDER — SODIUM CHLORIDE 0.9 % IJ SYRG
Freq: Three times a day (TID) | INTRAMUSCULAR | Status: DC
Start: 2017-05-27 — End: 2017-06-06
  Administered 2017-05-27 – 2017-06-06 (×29): via INTRAVENOUS

## 2017-05-27 MED FILL — LORAZEPAM 1 MG TAB: 1 mg | ORAL | Qty: 3

## 2017-05-27 MED FILL — TOPIRAMATE 25 MG TAB: 25 mg | ORAL | Qty: 1

## 2017-05-27 MED FILL — STRESS FORMULA WITH ZINC TABLET: ORAL | Qty: 1

## 2017-05-27 MED FILL — CHLORDIAZEPOXIDE 25 MG CAP: 25 mg | ORAL | Qty: 1

## 2017-05-27 MED FILL — VITAMIN B-1 100 MG TABLET: 100 mg | ORAL | Qty: 5

## 2017-05-27 MED FILL — PHENYTOIN SODIUM EXTENDED 100 MG CAP: 100 mg | ORAL | Qty: 3

## 2017-05-27 MED FILL — CHLORDIAZEPOXIDE 25 MG CAP: 25 mg | ORAL | Qty: 2

## 2017-05-27 MED FILL — LORAZEPAM 1 MG TAB: 1 mg | ORAL | Qty: 2

## 2017-05-27 NOTE — ED Notes (Signed)
TRANSFER - OUT REPORT:    Verbal report given to Delorise ShinerGrace, RN on Minna AntisPatti L Blakely  being transferred to 316 for routine progression of care       Report consisted of patient???s Situation, Background, Assessment and   Recommendations(SBAR).     Information from the following report(s) SBAR was reviewed with the receiving nurse.    Lines:   Peripheral IV 05/27/17 Right Hand (Active)        Opportunity for questions and clarification was provided.      Patient transported with:  Monitor, Registered Nurse

## 2017-05-27 NOTE — Progress Notes (Signed)
Verbal bedside report received from Grace, RN. Assumed care of patient.

## 2017-05-27 NOTE — H&P (Signed)
HOSPITALIST H&P/CONSULT  NAME:  Courtney Garcia   Age:  46 y.o.  DOB:   [birth year]   MRN:   [id number]  PCP: None  Consulting MD:  Treatment Team: Attending Provider: Mamie Nick, MD; Primary Nurse: Lenore Cordia, RN  HPI:   Patient is 46yo F with hx HCV, cirrhosis, EtOH abuse, seizure disorder who presents to ED with complaint of feeling like she is going to have a seizure, great deal of stress.    Drinking 1/2L vodka daily, last drink yesterday afternoon. Reports history of withdrawal seizure.   Also states has seizure disorder, for which she is prescribed topamax and dilantin, but has not been taking dilantin for over a month.   Pt told ER that she wanted to jump off a bridge.  Recent stressors include sudden death of boyfriend.  telepsych consulted.    Complete ROS done and is as stated in HPI or otherwise negative  Past Medical History:   Diagnosis Date   ??? Cancer ([place])     liver and pancreatic   ??? Cirrhosis ([place])    ??? Infectious disease     Hep C   ??? Seizures ([facility])       Past Surgical History:   Procedure Laterality Date   ??? HX ORTHOPAEDIC      right shoulder      Prior to Admission Medications   Prescriptions Last Dose Informant Patient Reported? Taking?   DIVALPROEX SODIUM (DEPAKOTE PO) Not Taking at Unknown time  Yes No   Sig: Take  by mouth.   LORazepam (ATIVAN) 1 mg tablet   No No   Sig: Take 1 Tab by mouth every eight (8) hours as needed for Anxiety. Max Daily Amount: 3 mg.   busPIRone (BUSPAR) 10 mg tablet   Yes Yes   Sig: Take 10 mg by mouth three (3) times daily.   magnesium oxide (MAG-OX) 400 mg tablet   No No   Sig: Take 1 Tab by mouth daily.   phenytoin ER (DILANTIN EXTENDED) 100 mg ER capsule   Yes Yes   Sig: Take 300 mg by mouth nightly.   topiramate (TOPAMAX) 100 mg tablet   Yes Yes   Sig: Take 100 mg by mouth two (2) times a day.      Facility-Administered Medications: None     No Known Allergies   Social History   Substance Use Topics   ??? Smoking status: Never Smoker    ??? Smokeless tobacco: Not on file   ??? Alcohol use Yes      Comment: "at least a gallon of vodka a day"      No family history on file.   Objective:     Visit Vitals   ??? BP 116/88 (BP 1 Location: Right arm, BP Patient Position: At rest)   ??? Pulse 97   ??? Temp 98.3 ??F (36.8 ??C)   ??? Resp 16   ??? Ht 5' (1.524 m)   ??? Wt 56.7 kg (125 lb)   ??? SpO2 95%   ??? BMI 24.41 kg/m2      Temp (24hrs), Avg:98.3 ??F (36.8 ??C), Min:98.3 ??F (36.8 ??C), Max:98.3 ??F (36.8 ??C)    Oxygen Therapy  O2 Sat (%): 95 % (05/26/17 1553)  Pulse via Oximetry: 97 beats per minute (05/26/17 1553)  O2 Device: Room air (05/26/17 1553)  Physical Exam:  General:    Alert, cooperative, anxious  Head:   Normocephalic, without obvious abnormality, atraumatic.  Nose:  Nares normal. No drainage or sinus tenderness.  Lungs:   Clear to auscultation bilaterally.  No Wheezing or Rhonchi. No rales.  Heart:   Regular rate and rhythm,  no murmur, rub or gallop.  Abdomen:   Soft, non-tender. Not distended.  Bowel sounds normal.   Extremities: No cyanosis.  No edema. No clubbing  Skin:     Texture, turgor normal. No rashes or lesions.  Not Jaundiced.   Neurologic: Alert and oriented x 3, no focal deficits   Psych:  labile, SI    Data Review:   Recent Results (from the past 24 hour(s))   CBC WITH AUTOMATED DIFF    Collection Time: 05/26/17  4:57 PM   Result Value Ref Range    WBC 4.4 4.3 - 11.1 K/uL    RBC 4.04 (L) 4.05 - 5.2 M/uL    HGB 13.0 11.7 - 15.4 g/dL    HCT 39.4 35.8 - 46.3 %    MCV 97.5 79.6 - 97.8 FL    MCH 32.2 26.1 - 32.9 PG    MCHC 33.0 31.4 - 35.0 g/dL    RDW 15.9 %    PLATELET 15 (LL) 150 - 450 K/uL    MPV Unable to calculate. Recommend adding IPF. 9.4 - 12.3 FL    ABSOLUTE NRBC 0.00 0.0 - 0.2 K/uL    NEUTROPHILS 48 43 - 78 %    LYMPHOCYTES 34 13 - 44 %    MONOCYTES 14 (H) 4.0 - 12.0 %    EOSINOPHILS 2 0.5 - 7.8 %    BASOPHILS 1 0.0 - 2.0 %    IMMATURE GRANULOCYTES 1 0.0 - 5.0 %    ABS. NEUTROPHILS 2.2 1.7 - 8.2 K/UL    ABS. LYMPHOCYTES 1.5 0.5 - 4.6 K/UL     ABS. MONOCYTES 0.6 0.1 - 1.3 K/UL    ABS. EOSINOPHILS 0.1 0.0 - 0.8 K/UL    ABS. BASOPHILS 0.0 0.0 - 0.2 K/UL    ABS. IMM. GRANS. 0.0 0.0 - 0.5 K/UL    RBC COMMENTS SLIGHT  ANISOCYTOSIS + POIKILOCYTOSIS        RBC COMMENTS MODERATE  TARGET CELLS        WBC COMMENTS Result Confirmed By Smear      PLATELET COMMENTS MARKED      DF AUTOMATED     METABOLIC PANEL, COMPREHENSIVE    Collection Time: 05/26/17  4:57 PM   Result Value Ref Range    Sodium 145 136 - 145 mmol/L    Potassium 4.3 3.5 - 5.1 mmol/L    Chloride 110 (H) 98 - 107 mmol/L    CO2 24 21 - 32 mmol/L    Anion gap 11 7 - 16 mmol/L    Glucose 101 (H) 65 - 100 mg/dL    BUN 5 (L) 6 - 23 MG/DL    Creatinine 0.66 0.6 - 1.0 MG/DL    GFR est AA >60 >60 ml/min/1.31m    GFR est non-AA >60 >60 ml/min/1.712m   Calcium 8.0 (L) 8.3 - 10.4 MG/DL    Bilirubin, total 1.3 (H) 0.2 - 1.1 MG/DL    ALT (SGPT) 39 12 - 65 U/L    AST (SGOT) 163 (H) 15 - 37 U/L    Alk. phosphatase 207 (H) 50 - 136 U/L    Protein, total 8.5 (H) 6.3 - 8.2 g/dL    Albumin 2.9 (L) 3.5 - 5.0 g/dL    Globulin 5.6 (H) 2.3 - 3.5  g/dL    A-G Ratio 0.5 (L) 1.2 - 3.5     ETHYL ALCOHOL    Collection Time: 05/26/17  4:57 PM   Result Value Ref Range    ALCOHOL(ETHYL),SERUM 409 (HH) MG/DL   EKG, 12 LEAD, INITIAL    Collection Time: 05/26/17  5:03 PM   Result Value Ref Range    Ventricular Rate 70 BPM    Atrial Rate 70 BPM    P-R Interval 172 ms    QRS Duration 84 ms    Q-T Interval 428 ms    QTC Calculation (Bezet) 462 ms    Calculated P Axis 76 degrees    Calculated R Axis 92 degrees    Calculated T Axis 64 degrees    Diagnosis       Normal sinus rhythm  Rightward axis  Borderline ECG  When compared with ECG of 28-Aug-2014 23:41,  Vent. rate has decreased BY  34 BPM  QT has shortened  Confirmed by [facility]  MD ([facility]), MATTHEW G ([id number]) on 05/26/2017 9:27:41 PM     HCG URINE, QL. - POC    Collection Time: 05/27/17 12:59 AM   Result Value Ref Range    Pregnancy test,urine (POC) NEGATIVE  NEG     URINE MICROSCOPIC     Collection Time: 05/27/17  1:01 AM   Result Value Ref Range    WBC 10-20 0 /hpf    RBC 0-3 0 /hpf    Epithelial cells 3-5 0 /hpf    Bacteria 0 0 /hpf    Casts HYALINE 0 /lpf    Crystals, urine CA OXALATE 0 /LPF    Mucus 0 0 /lpf    Other observations RESULTS VERIFIED MANUALLY     AMMONIA    Collection Time: 05/27/17  1:52 AM   Result Value Ref Range    Ammonia 34 (H) 11 - 32 UMOL/L   CBC W/O DIFF    Collection Time: 05/27/17  9:07 AM   Result Value Ref Range    WBC 3.3 (L) 4.3 - 11.1 K/uL    RBC 4.31 4.05 - 5.2 M/uL    HGB 13.8 11.7 - 15.4 g/dL    HCT 41.5 35.8 - 46.3 %    MCV 96.3 79.6 - 97.8 FL    MCH 32.0 26.1 - 32.9 PG    MCHC 33.3 31.4 - 35.0 g/dL    RDW 15.5 %    PLATELET 7 (LL) 150 - 450 K/uL    MPV 12.9 (H) 9.4 - 12.3 FL    ABSOLUTE NRBC 0.00 0.0 - 0.2 K/uL   METABOLIC PANEL, COMPREHENSIVE    Collection Time: 05/27/17  9:07 AM   Result Value Ref Range    Sodium 139 136 - 145 mmol/L    Potassium 3.5 3.5 - 5.1 mmol/L    Chloride 106 98 - 107 mmol/L    CO2 22 21 - 32 mmol/L    Anion gap 11 7 - 16 mmol/L    Glucose 96 65 - 100 mg/dL    BUN 4 (L) 6 - 23 MG/DL    Creatinine 0.56 (L) 0.6 - 1.0 MG/DL    GFR est AA >60 >60 ml/min/1.36m    GFR est non-AA >60 >60 ml/min/1.779m   Calcium 8.1 (L) 8.3 - 10.4 MG/DL    Bilirubin, total 2.0 (H) 0.2 - 1.1 MG/DL    ALT (SGPT) 40 12 - 65 U/L    AST (SGOT) 157 (H) 15 - 37 U/L  Alk. phosphatase 196 (H) 50 - 136 U/L    Protein, total 8.6 (H) 6.3 - 8.2 g/dL    Albumin 2.9 (L) 3.5 - 5.0 g/dL    Globulin 5.7 (H) 2.3 - 3.5 g/dL    A-G Ratio 0.5 (L) 1.2 - 3.5     PROTHROMBIN TIME + INR    Collection Time: 05/27/17  9:07 AM   Result Value Ref Range    Prothrombin time 15.1 (H) 11.5 - 14.5 sec    INR 1.3     PREALBUMIN    Collection Time: 05/27/17  9:07 AM   Result Value Ref Range    Prealbumin 12.2 (L) 18.0 - 35.7 MG/DL         Assessment and Plan:     Active Hospital Problems    Diagnosis Date Noted   ??? Alcohol abuse 05/27/2017   ??? Seizure disorder ([place]) 05/27/2017    ??? Cirrhosis ([place]) 05/27/2017   ??? Hep C w/o coma, chronic ([place]) 05/27/2017   ??? Thrombocytopenia ([place]) 05/27/2017   ??? Alcohol withdrawal ([place]) 05/27/2017       PLAN:  SI - telepsych recommends inpatient psych care. Case management consulted.    EtOH abuse - intoxicated EtOH level 400 at admission, has high risk of serious withdrawal complications while medically admitted. CIWA scores, librium, prn ativan.  Thiamine/folate supplementation. Last drink 9/17.    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  Reports dilantin/topamax, restart.    Thrombocytopenia - severe range, repeating.  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 Confirm with repeat CBC, obtain smear.    Cirrhosis: by pt report. No ascites, ammonia mildly elevated.     DVT PPx: holding for severe thrombocytopenia  Code Status: full    Anticipated discharge: case mgmt pending    Signed By: Angela Adam, MD     May 27, 2017

## 2017-05-27 NOTE — Progress Notes (Signed)
Chaplain made initial visit.  Pt was asleep.  Chaplain prayed for pt and left spiritual care card to let her know that a chaplain visited and prayed for her.  Pt appeared comfortable with no pain level expressed or observed.  Chaplain provided spiritual care through presence, prayer, and leaving card.

## 2017-05-27 NOTE — ED Notes (Addendum)
SOC called to check the status of pt in queue, pt not currently in queue; placed new request for tele-psych. Spoke with Harriett SineNancy, tech stated long queue at this time.

## 2017-05-27 NOTE — Progress Notes (Signed)
Problem: Falls - Risk of  Goal: *Absence of Falls  Document Schmid Fall Risk and appropriate interventions in the flowsheet.   Outcome: Progressing Towards Goal  Fall Risk Interventions:            Medication Interventions: Patient to call before getting OOB, Teach patient to arise slowly

## 2017-05-27 NOTE — Progress Notes (Signed)
TRANSFER - IN REPORT:    Verbal report received from [place], RN on Minna Antisatti L Zeitz being received from ED for routine progression of care      Report consisted of patient???s Situation, Background, Assessment and Recommendations(SBAR).     Information from the following report(s) SBAR, Kardex, [facility] and Cardiac Rhythm normal sinus was reviewed with the receiving nurse.    Opportunity for questions and clarification was provided.      Assessment completed upon patient???s arrival to unit and care assumed.

## 2017-05-27 NOTE — Progress Notes (Signed)
Problem: Falls - Risk of  Goal: *Absence of Falls  Document Schmid Fall Risk and appropriate interventions in the flowsheet.   Outcome: Progressing Towards Goal  Fall Risk Interventions:            Medication Interventions: Patient to call before getting OOB

## 2017-05-27 NOTE — Progress Notes (Addendum)
Skin assessment completed. Sacrum intact with no signs of pressure ulcer development. Scattered bruising all over body particularly on flank. Redness and irritation in left eye. Redness and swelling just below left eye.

## 2017-05-27 NOTE — Progress Notes (Signed)
Verbal bedside report given to Jason, oncoming RN. Patient's situation, background, assessment and recommendations provided. Opportunity for questions provided. Oncoming RN assumed care of patient.

## 2017-05-28 ENCOUNTER — Inpatient Hospital Stay: Admit: 2017-05-28 | Payer: Self-pay

## 2017-05-28 LAB — METABOLIC PANEL, COMPREHENSIVE
A-G Ratio: 0.4 — ABNORMAL LOW (ref 1.2–3.5)
ALT (SGPT): 39 U/L (ref 12–65)
AST (SGOT): 175 U/L — ABNORMAL HIGH (ref 15–37)
Albumin: 2.5 g/dL — ABNORMAL LOW (ref 3.5–5.0)
Alk. phosphatase: 200 U/L — ABNORMAL HIGH (ref 50–136)
Anion gap: 11 mmol/L (ref 7–16)
BUN: 6 MG/DL (ref 6–23)
Bilirubin, total: 3.2 MG/DL — ABNORMAL HIGH (ref 0.2–1.1)
CO2: 20 mmol/L — ABNORMAL LOW (ref 21–32)
Calcium: 8.7 MG/DL (ref 8.3–10.4)
Chloride: 104 mmol/L (ref 98–107)
Creatinine: 0.64 MG/DL (ref 0.6–1.0)
GFR est AA: >60 mL/min/{1.73_m2} (ref >60)
GFR est non-AA: >60 mL/min/{1.73_m2} (ref >60)
Globulin: 5.9 g/dL — ABNORMAL HIGH (ref 2.3–3.5)
Glucose: 89 mg/dL (ref 65–100)
Potassium: 5.1 mmol/L (ref 3.5–5.1)
Protein, total: 8.4 g/dL — ABNORMAL HIGH (ref 6.3–8.2)
Sodium: 135 mmol/L — ABNORMAL LOW (ref 136–145)

## 2017-05-28 LAB — CBC WITH AUTOMATED DIFF
ABS. BASOPHILS: 0 10*3/uL (ref 0.0–0.2)
ABS. EOSINOPHILS: 0.1 10*3/uL (ref 0.0–0.8)
ABS. IMM. GRANS.: 0 10*3/uL (ref 0.0–0.5)
ABS. LYMPHOCYTES: 0.7 10*3/uL (ref 0.5–4.6)
ABS. MONOCYTES: 0.5 10*3/uL (ref 0.1–1.3)
ABS. NEUTROPHILS: 2.1 10*3/uL (ref 1.7–8.2)
ABSOLUTE NRBC: 0 10*3/uL (ref 0.0–0.2)
BASOPHILS: 1 % (ref 0.0–2.0)
EOSINOPHILS: 2 % (ref 0.5–7.8)
HCT: 39.7 % (ref 35.8–46.3)
HGB: 13.3 g/dL (ref 11.7–15.4)
IMMATURE GRANULOCYTES: 0 % (ref 0.0–5.0)
LYMPHOCYTES: 22 % (ref 13–44)
MCH: 32 PG (ref 26.1–32.9)
MCHC: 33.5 g/dL (ref 31.4–35.0)
MCV: 95.7 FL (ref 79.6–97.8)
MONOCYTES: 13 % — ABNORMAL HIGH (ref 4.0–12.0)
MPV: 12.1 FL (ref 9.4–12.3)
NEUTROPHILS: 62 % (ref 43–78)
PLATELET: 7 10*3/uL — CL (ref 150–450)
RBC: 4.15 M/uL (ref 4.05–5.2)
RDW: 15.4 %
WBC: 3.4 10*3/uL — ABNORMAL LOW (ref 4.3–11.1)

## 2017-05-28 LAB — TRANSFERRIN SATURATION
Iron: 218 ug/dL — ABNORMAL HIGH (ref 35–150)
TIBC: 251 ug/dL (ref 250–450)
Transferrin Saturation: 87 % (ref >20)

## 2017-05-28 LAB — D DIMER: D DIMER: 1.89 ug/ml(FEU) — CR (ref <0.56)

## 2017-05-28 LAB — CANCER AG 19-9: Cancer antigen 19-9: 166 U/mL — ABNORMAL HIGH (ref 2.0–37.0)

## 2017-05-28 LAB — PTT: aPTT: 34.1 s (ref 23.2–35.3)

## 2017-05-28 LAB — AFP, TUMOR MARKER
AFP, TUMOR MARKER, AFP: 9 ng/mL — ABNORMAL HIGH (ref <8.0)
AFP, Tumor marker: 9 ng/mL — ABNORMAL HIGH (ref <8.0)

## 2017-05-28 LAB — FIBRINOGEN: Fibrinogen: 258 mg/dL (ref 190–501)

## 2017-05-28 LAB — PATHOLOGIST REVIEW SMEARS

## 2017-05-28 LAB — FERRITIN: Ferritin: 104 NG/ML (ref 8–388)

## 2017-05-28 LAB — D-DIMER, QUANTITATIVE: D-Dimer, Quant: 1.89 ug/ml(FEU) (ref <0.56)

## 2017-05-28 MED ORDER — SODIUM CHLORIDE 0.9 % IV
INTRAVENOUS | Status: DC | PRN
Start: 2017-05-28 — End: 2017-06-06
  Administered 2017-05-29: 02:00:00 via INTRAVENOUS

## 2017-05-28 MED ORDER — TUBERCULIN PPD 5 UNIT/0.1 ML INTRADERMAL
5 tub. unit /0.1 mL | Freq: Once | INTRADERMAL | Status: AC
Start: 2017-05-28 — End: 2017-05-29

## 2017-05-28 MED FILL — TOPIRAMATE 25 MG TAB: 25 mg | ORAL | Qty: 1

## 2017-05-28 MED FILL — CHLORDIAZEPOXIDE 25 MG CAP: 25 mg | ORAL | Qty: 1

## 2017-05-28 MED FILL — LORAZEPAM 1 MG TAB: 1 mg | ORAL | Qty: 4

## 2017-05-28 MED FILL — LORAZEPAM 1 MG TAB: 1 mg | ORAL | Qty: 1

## 2017-05-28 MED FILL — THIAMINE HCL 100 MG TAB: 100 mg | ORAL | Qty: 1

## 2017-05-28 MED FILL — STRESS FORMULA WITH ZINC TABLET: ORAL | Qty: 1

## 2017-05-28 MED FILL — SODIUM CHLORIDE 0.9 % IV: INTRAVENOUS | Qty: 250

## 2017-05-28 MED FILL — FOLIC ACID 1 MG TAB: 1 mg | ORAL | Qty: 1

## 2017-05-28 MED FILL — LORAZEPAM 1 MG TAB: 1 mg | ORAL | Qty: 3

## 2017-05-28 MED FILL — PHENYTOIN SODIUM EXTENDED 100 MG CAP: 100 mg | ORAL | Qty: 3

## 2017-05-28 NOTE — Progress Notes (Signed)
TRANSFER - OUT REPORT:    Verbal report given to Clydie BraunKaren, Charity fundraiserN on Minna Antisatti L Ramer being transferred to 8th floor for routine progression of care       Report consisted of patient???s Situation, Background, Assessment and Recommendations (SBAR).     Information from the following report(s) SBAR, Kardex, Intake/Output, MAR, Recent Results and Cardiac Rhythm NSR was reviewed with the receiving nurse.    Opportunity for questions and clarification was provided.

## 2017-05-28 NOTE — Progress Notes (Signed)
Patient received via stretcher from telemetry to room 835. Patient A&O x4, orientation to room/unit given. Shift assessment completed. Skin assessment completed with M. Osie CheeksFricks RN. Skin warm/dry. Multiple ecchymotic areas noted to bilateral arms and left hip. Multiple tattoos noted to back of neck, back , and bilateral upper arms. No areas of skin breakdown noted. Remote telemetry monitor in place. Bed in low position, call bell and posey alarm in place for safety. Will monitor. For more info, see shift assessment.

## 2017-05-28 NOTE — Progress Notes (Signed)
Verbal bedside report given to Devaun, oncoming RN. Patient's situation, background, assessment and recommendations provided. Opportunity for questions provided. Oncoming RN assumed care of patient.

## 2017-05-28 NOTE — Progress Notes (Signed)
Progress Note    05/28/2017  Admit Date: 05/26/2017  4:04 PM   NAME: Courtney Garcia   DOB:  [birth year]   MRN:  [id number]   Attending: Angela Adam, MD  PCP:  None  Treatment Team: Attending Provider: Angela Adam, MD; Care Manager: Tama Gander; Consulting Provider: Letha Cape, MD    Full Code   SUBJECTIVE:   Courtney Garcia is a 46 yo female with PMH of HCV, cirrhosis (followed at [facility] in [place]), ETOH abuse, seizure disorder who presented with c/o feeling as if she is going to have a seizure.  She reports under a great deal of stress. Drinks 1/2 liter of vodka daily.  One month ago her live in boyfriend suffered sudden cardiac death in front of her.  She had to move out of her home in [place] after the death, back to [place] and is living temporarily with a friend.  She was seen by Telepsych which recommended inpatient psych care.  She reports suicidal ideations on arrival to ED (wanted to jump off bridge) but denies suicidal/homicidal ideations currently.  She was found to have thrombocytopenia, has hx but platelets worse this admission.  Admitted on alcohol withdrawal protocol, alcohol level on admission >400.  PT c/o generalized abd discomfort. Denies CP, SOB, n/v/d.         Past Medical History:   Diagnosis Date   ??? Cancer ([place])     liver and pancreatic   ??? Cirrhosis ([place])    ??? Infectious disease     Hep C   ??? Seizures ([facility])      Recent Results (from the past 24 hour(s))   PATHOLOGIST REVIEW SMEARS    Collection Time: 05/27/17 11:53 AM   Result Value Ref Range    PATHOLOGIST REVIEW PENDING    CBC W/O DIFF    Collection Time: 05/27/17 11:53 AM   Result Value Ref Range    WBC 3.6 (L) 4.3 - 11.1 K/uL    RBC 3.86 (L) 4.05 - 5.2 M/uL    HGB 12.6 11.7 - 15.4 g/dL    HCT 36.5 35.8 - 46.3 %    MCV 94.6 79.6 - 97.8 FL    MCH 32.6 26.1 - 32.9 PG    MCHC 34.5 31.4 - 35.0 g/dL    RDW 15.3 %    PLATELET 11 (LL) 150 - 450 K/uL    MPV 11.9 9.4 - 12.3 FL    ABSOLUTE NRBC 0.00 0.0 - 0.2 K/uL   METABOLIC PANEL, COMPREHENSIVE     Collection Time: 05/28/17  8:36 AM   Result Value Ref Range    Sodium 135 (L) 136 - 145 mmol/L    Potassium 5.1 3.5 - 5.1 mmol/L    Chloride 104 98 - 107 mmol/L    CO2 20 (L) 21 - 32 mmol/L    Anion gap 11 7 - 16 mmol/L    Glucose 89 65 - 100 mg/dL    BUN 6 6 - 23 MG/DL    Creatinine 0.64 0.6 - 1.0 MG/DL    GFR est AA >60 >60 ml/min/1.58m    GFR est non-AA >60 >60 ml/min/1.755m   Calcium 8.7 8.3 - 10.4 MG/DL    Bilirubin, total 3.2 (H) 0.2 - 1.1 MG/DL    ALT (SGPT) 39 12 - 65 U/L    AST (SGOT) 175 (H) 15 - 37 U/L    Alk. phosphatase 200 (H) 50 - 136 U/L    Protein, total 8.4 (H)  6.3 - 8.2 g/dL    Albumin 2.5 (L) 3.5 - 5.0 g/dL    Globulin 5.9 (H) 2.3 - 3.5 g/dL    A-G Ratio 0.4 (L) 1.2 - 3.5     CBC WITH AUTOMATED DIFF    Collection Time: 05/28/17  8:36 AM   Result Value Ref Range    WBC 3.4 (L) 4.3 - 11.1 K/uL    RBC 4.15 4.05 - 5.2 M/uL    HGB 13.3 11.7 - 15.4 g/dL    HCT 39.7 35.8 - 46.3 %    MCV 95.7 79.6 - 97.8 FL    MCH 32.0 26.1 - 32.9 PG    MCHC 33.5 31.4 - 35.0 g/dL    RDW 15.4 %    PLATELET 7 (LL) 150 - 450 K/uL    MPV 12.1 9.4 - 12.3 FL    ABSOLUTE NRBC 0.00 0.0 - 0.2 K/uL    DF AUTOMATED      NEUTROPHILS 62 43 - 78 %    LYMPHOCYTES 22 13 - 44 %    MONOCYTES 13 (H) 4.0 - 12.0 %    EOSINOPHILS 2 0.5 - 7.8 %    BASOPHILS 1 0.0 - 2.0 %    IMMATURE GRANULOCYTES 0 0.0 - 5.0 %    ABS. NEUTROPHILS 2.1 1.7 - 8.2 K/UL    ABS. LYMPHOCYTES 0.7 0.5 - 4.6 K/UL    ABS. MONOCYTES 0.5 0.1 - 1.3 K/UL    ABS. EOSINOPHILS 0.1 0.0 - 0.8 K/UL    ABS. BASOPHILS 0.0 0.0 - 0.2 K/UL    ABS. IMM. GRANS. 0.0 0.0 - 0.5 K/UL     No Known Allergies  Current Facility-Administered Medications   Medication Dose Route Frequency Provider Last Rate Last Dose   ??? chlordiazePOXIDE (LIBRIUM) capsule 25 mg  25 mg Oral QID Mamie Nick, MD   25 mg at 05/28/17 0941   ??? thiamine HCL (B-1) tablet 100 mg  100 mg Oral DAILY Mamie Nick, MD   100 mg at 05/28/17 0941    ??? multivitamin w ZN ([place]) tablet  1 Tab Oral DAILY Mamie Nick, MD   1 Tab at 05/28/17 0941   ??? sodium chloride (NS) flush 5-10 mL  5-10 mL IntraVENous Q8H Angela Adam, MD   5 mL at 05/28/17 0526   ??? sodium chloride (NS) flush 5-10 mL  5-10 mL IntraVENous PRN Angela Adam, MD       ??? folic acid (FOLVITE) tablet 1 mg  1 mg Oral DAILY Angela Adam, MD   1 mg at 05/28/17 0941   ??? phenytoin ER (DILANTIN ER) ER capsule 300 mg  300 mg Oral QHS Angela Adam, MD   300 mg at 05/27/17 2124   ??? topiramate (TOPAMAX) tablet 25 mg  25 mg Oral BID WITH MEALS Angela Adam, MD   25 mg at 05/28/17 0941   ??? influenza vaccine 2018-19 (6 mos+)(PF) (FLUARIX QUAD/FLULAVAL QUAD) injection 0.5 mL  0.5 mL IntraMUSCular PRIOR TO DISCHARGE Angela Adam, MD       ??? LORazepam (ATIVAN) tablet 1-2 mg  1-2 mg Oral Q1H Angela Adam, MD   1 mg at 05/28/17 0949   ??? LORazepam (ATIVAN) tablet 3-4 mg  3-4 mg Oral Q1H Angela Adam, MD   Stopped at 05/28/17 0100       Review of Systems negative with exception of pertinent positives noted above  PHYSICAL EXAM     Visit Vitals   ???  BP (!) 143/97 (BP 1 Location: Left arm, BP Patient Position: Lying left side)   ??? Pulse (!) 105   ??? Temp 99.5 ??F (37.5 ??C)   ??? Resp 16   ??? Ht 5' (1.524 m)   ??? Wt 58.3 kg (128 lb 9.6 oz)   ??? SpO2 92%   ??? BMI 25.12 kg/m2      Temp (24hrs), Avg:99.1 ??F (37.3 ??C), Min:98.9 ??F (37.2 ??C), Max:99.5 ??F (37.5 ??C)    Oxygen Therapy  O2 Sat (%): 92 % (05/28/17 0822)  Pulse via Oximetry: 97 beats per minute (05/26/17 1553)  O2 Device: Room air (05/28/17 0945)  No intake or output data in the 24 hours ending 05/28/17 1038   General: No acute distress. Tearful, dishelved????  Lungs: CTA bilaterally. Resp even and non labored  Heart:  S1S2 present without murmurs rubs gallops. Tachycardia, regular rhythm. No edema  Abdomen: Soft, non tender, non distended. BS present  Extremities: Moves ext spontaneously.  No cyanosis.   Neurologic:?? A/O X4. No focal deficits  Psych: Tearful, cooperative.      Results summary of Diagnostic Studies/Procedures copied from within [facility] EMR:         [facility] Problems    Diagnosis Date Noted   ??? Alcohol abuse 05/27/2017   ??? Seizure disorder ([place]) 05/27/2017   ??? Cirrhosis ([place]) 05/27/2017   ??? Hep C w/o coma, chronic ([place]) 05/27/2017   ??? Thrombocytopenia ([place]) 05/27/2017   ??? Alcohol withdrawal ([place]) 05/27/2017     Plan:    Suicidal ideations:  Telepsych recommends inpatient psychiatric care.  Pt has had outpatient care in the past.  Case management consulted.    ETOH abuse:  Alcohol level on admission >400.  Continue librium, prn ativan, CIWA scores.  Continue thiamine, folate supplementation.    Seizure disorder:  Continue dilantin, topamax.. Seizure precautions    Thrombocytopenia:  Hematology consulted.  Pathologist smear pending.     Cirrhosis: pt reports followed at [facility] in [place] however will need establishment in [place], just recently moved.      Hepatitis C: per pt reports. Followed at [facility] in [place].     May move pt to remote tele.     Notes, labs, VS, diagnostic testing reviewed  Time spent with pt 20 min      DVT Prophylaxis: SCDs  Plan of Care Discussed with: Supervising MD  Dr. Rosana Hoes, care team, pt   Christianne Borrow, NP

## 2017-05-28 NOTE — Progress Notes (Signed)
Pt admitted to 3rd floor tele for Alcohol withdrawl. CM met with pt to discuss CM needs & DCP. Pt is A&Ox4. Pt is indep at home with all ADLS. Pt lives with friend, was living in [place], plans to remain in the [place]. Pt is not currently employed. Pt has no DME needs.  Pt was recently dc from liver clinic in [place].  Pt states emergency contact is Father Truddie Coco [phone]. CM has contacted the following facilities for inpatient substance abuse treatment: No bedas available--- [facility], [facility], [facility], [facility], [facility], [place] [facility]. Pt is agreeable to dc to treatment center if placement is available. DCP TBD CM to continue to monitor.    Care Management Interventions  PCP Verified by CM: No  Mode of Transport at Discharge: Self  Transition of Care Consult (CM Consult): Discharge Planning, Other  Discharge Durable Medical Equipment: No  Physical Therapy Consult: No  Occupational Therapy Consult: No  Speech Therapy Consult: No  Current Support Network: Other (Living with friend)  Confirm Follow Up Transport: Friends  Plan discussed with Pt/Family/Caregiver: Yes  Freedom of Choice Offered: Yes  Discharge Location  Discharge Placement: Unable to determine at this time

## 2017-05-28 NOTE — Consults (Addendum)
Courtney Garcia    Inpatient Hematology / Garcia Consult Note    Reason for Consult:  Alcohol withdrawal [facility])  Referring Physician:  Angela Adam, MD    History of Present Illness:  Courtney Garcia is a 46 y.o. female admitted on 05/26/2017 with a primary diagnosis of suicidal ideation, alcohol intoxication, cirrhosis, and thrombocytopenia.     She has a primary medical history including HCV, cirrhosis, EtOH abuse, seizure disorder who presented to the ED with complaints of being very stressed due to the recent sudden death of her boyfriend. She felt like she was going to have a seizure. She reports a history of seizure disorder on Topamax and Dilantin but has not been taking the Dilantin for over a month. She drinks 1-2 liters of vodka a day - currently on Librium and PRN Ativan. She has a history of cirrhosis and thrombocytopenia. Platelets 7,000 today. Of note, she states she was told she had pancreatic cancer about 3 years ago. Denies ever having a biopsy. We have been consulted for her thrombocytopenia.     Review of Systems:  Constitutional Denies fever, chills, weight loss, appetite changes.   + fatigue, weakness.    HEENT Denies trauma, blurry vision, hearing loss, ear pain, nosebleeds, sore throat, neck pain and ear discharge.    Skin Denies lesions or rashes.   Lungs Denies dyspnea, cough, sputum production or hemoptysis.   Cardiovascular Denies chest pain, palpitations, or lower extremity edema.   Gastrointestinal Denies nausea, vomiting, changes in bowel habits, bloody or black stools, abdominal pain.    GU Denies dysuria, frequency or hesitancy of urination.   Neuro Denies headaches, visual changes or ataxia. Denies dizziness, tingling, sensory change, speech change, focal weakness or headaches. + tremors.      Hematology + easy bruising or bleeding, epistaxis.   Endo Denies heat/cold intolerance, denies diabetes or thyroid abnormalities.    MSK Denies back pain, arthralgias, myalgias or frequent falls.     Psychiatric/Behavioral + depression, substance abuse, nervous/anxious.         No Known Allergies  Past Medical History:   Diagnosis Date   ??? Cancer ([place])     liver and pancreatic   ??? Cirrhosis ([place])    ??? Infectious disease     Hep C   ??? Seizures ([facility])      Past Surgical History:   Procedure Laterality Date   ??? HX ORTHOPAEDIC      right shoulder     No family history on file.  Social History     Social History   ??? Marital status: MARRIED     Spouse name: N/A   ??? Number of children: N/A   ??? Years of education: N/A     Occupational History   ??? Not on file.     Social History Main Topics   ??? Smoking status: Never Smoker   ??? Smokeless tobacco: Not on file   ??? Alcohol use Yes      Comment: "at least a gallon of vodka a day"   ??? Drug use: Yes     Special: Cocaine   ??? Sexual activity: Not on file     Other Topics Concern   ??? Not on file     Social History Narrative   ??? No narrative on file     Current Facility-Administered Medications   Medication Dose Route Frequency Provider Last Rate Last Dose   ??? tuberculin injection 5 Units  5  Units IntraDERMal ONCE Angela Adam, MD       ??? chlordiazePOXIDE (LIBRIUM) capsule 25 mg  25 mg Oral QID Mamie Nick, MD   25 mg at 05/28/17 0941   ??? thiamine HCL (B-1) tablet 100 mg  100 mg Oral DAILY Mamie Nick, MD   100 mg at 05/28/17 0941   ??? multivitamin w ZN ([place]) tablet  1 Tab Oral DAILY Mamie Nick, MD   1 Tab at 05/28/17 0941   ??? sodium chloride (NS) flush 5-10 mL  5-10 mL IntraVENous Q8H Angela Adam, MD   5 mL at 05/28/17 0526   ??? sodium chloride (NS) flush 5-10 mL  5-10 mL IntraVENous PRN Angela Adam, MD       ??? folic acid (FOLVITE) tablet 1 mg  1 mg Oral DAILY Angela Adam, MD   1 mg at 05/28/17 0941   ??? phenytoin ER (DILANTIN ER) ER capsule 300 mg  300 mg Oral QHS Angela Adam, MD   300 mg at 05/27/17 2124   ??? topiramate (TOPAMAX) tablet 25 mg  25 mg Oral BID WITH MEALS Angela Adam,  MD   25 mg at 05/28/17 0941   ??? influenza vaccine 2018-19 (6 mos+)(PF) (FLUARIX QUAD/FLULAVAL QUAD) injection 0.5 mL  0.5 mL IntraMUSCular PRIOR TO DISCHARGE Angela Adam, MD       ??? LORazepam (ATIVAN) tablet 1-2 mg  1-2 mg Oral Q1H Angela Adam, MD   Stopped at 05/28/17 1000   ??? LORazepam (ATIVAN) tablet 3-4 mg  3-4 mg Oral Q1H Angela Adam, MD   Stopped at 05/28/17 0100       OBJECTIVE:  Patient Vitals for the past 8 hrs:   BP Temp Pulse Resp SpO2   05/28/17 1214 126/86 97.1 ??F (36.2 ??C) (!) 108 16 94 %   05/28/17 0822 (!) 143/97 99.5 ??F (37.5 ??C) (!) 105 16 92 %     Temp (24hrs), Avg:98.8 ??F (37.1 ??C), Min:97.1 ??F (36.2 ??C), Max:99.5 ??F (37.5 ??C)    09/19 0701 - 09/19 1900  In: 100 [P.O.:100]  Out: -     Physical Exam:  Constitutional: Well developed, well nourished female in no acute distress, lying comfortably in the hospital bed.    HEENT: Normocephalic and atraumatic. Oropharynx is clear, mucous membranes are moist.  Extraocular muscles are intact.  Sclerae anicteric. Neck supple.    Lymph node   No palpable submandibular, cervical, supraclavicular, axillary or inguinal lymph nodes.   Skin Warm and dry.  + bruising. No rash noted.  No erythema.  No pallor.    Respiratory Lungs are clear to auscultation bilaterally without wheezes, rales or rhonchi, normal air exchange without accessory muscle use.    CVS Tachycardic rate, regular rhythm and normal S1 and S2.  No murmurs, gallops, or rubs.   Abdomen Soft, nontender and nondistended, normoactive bowel sounds.  No palpable mass.  No hepatosplenomegaly.   Neuro Grossly nonfocal with no obvious sensory or motor deficits. + tremors.    MSK Normal range of motion in general.  No edema and no tenderness.   Psych Flat mood and affect.        Labs:    Recent Results (from the past 24 hour(s))   METABOLIC PANEL, COMPREHENSIVE    Collection Time: 05/28/17  8:36 AM   Result Value Ref Range    Sodium 135 (L) 136 - 145 mmol/L    Potassium 5.1 3.5 - 5.1 mmol/L  Chloride 104 98 - 107 mmol/L    CO2 20 (L) 21 - 32 mmol/L    Anion gap 11 7 - 16 mmol/L    Glucose 89 65 - 100 mg/dL    BUN 6 6 - 23 MG/DL    Creatinine 0.64 0.6 - 1.0 MG/DL    GFR est AA >60 >60 ml/min/1.69m    GFR est non-AA >60 >60 ml/min/1.744m   Calcium 8.7 8.3 - 10.4 MG/DL    Bilirubin, total 3.2 (H) 0.2 - 1.1 MG/DL    ALT (SGPT) 39 12 - 65 U/L    AST (SGOT) 175 (H) 15 - 37 U/L    Alk. phosphatase 200 (H) 50 - 136 U/L    Protein, total 8.4 (H) 6.3 - 8.2 g/dL    Albumin 2.5 (L) 3.5 - 5.0 g/dL    Globulin 5.9 (H) 2.3 - 3.5 g/dL    A-G Ratio 0.4 (L) 1.2 - 3.5     CBC WITH AUTOMATED DIFF    Collection Time: 05/28/17  8:36 AM   Result Value Ref Range    WBC 3.4 (L) 4.3 - 11.1 K/uL    RBC 4.15 4.05 - 5.2 M/uL    HGB 13.3 11.7 - 15.4 g/dL    HCT 39.7 35.8 - 46.3 %    MCV 95.7 79.6 - 97.8 FL    MCH 32.0 26.1 - 32.9 PG    MCHC 33.5 31.4 - 35.0 g/dL    RDW 15.4 %    PLATELET 7 (LL) 150 - 450 K/uL    MPV 12.1 9.4 - 12.3 FL    ABSOLUTE NRBC 0.00 0.0 - 0.2 K/uL    DF AUTOMATED      NEUTROPHILS 62 43 - 78 %    LYMPHOCYTES 22 13 - 44 %    MONOCYTES 13 (H) 4.0 - 12.0 %    EOSINOPHILS 2 0.5 - 7.8 %    BASOPHILS 1 0.0 - 2.0 %    IMMATURE GRANULOCYTES 0 0.0 - 5.0 %    ABS. NEUTROPHILS 2.1 1.7 - 8.2 K/UL    ABS. LYMPHOCYTES 0.7 0.5 - 4.6 K/UL    ABS. MONOCYTES 0.5 0.1 - 1.3 K/UL    ABS. EOSINOPHILS 0.1 0.0 - 0.8 K/UL    ABS. BASOPHILS 0.0 0.0 - 0.2 K/UL    ABS. IMM. GRANS. 0.0 0.0 - 0.5 K/UL       Imaging:  None     ASSESSMENT:  Problem List  Never Reviewed          Codes Class Noted    Alcohol abuse ICD-10-CM: F10.10  ICD-9-CM: 305.00  05/27/2017        Seizure disorder (HCTraverseICD-10-CM: G40.909  ICD-9-CM: 345.90  05/27/2017        Cirrhosis (HCWest University PlaceICD-10-CM: K74.60  ICD-9-CM: 571.5  05/27/2017        Hep C w/o coma, chronic (HCParkersburgICD-10-CM: B18.2  ICD-9-CM: 070.54  05/27/2017        Thrombocytopenia (HCRenoICD-10-CM: D69.6  ICD-9-CM: 287.5  05/27/2017        * (Principal)Alcohol withdrawal (HCMelvernICD-10-CM: F1G38.756 ICD-9-CM: 291.81  05/27/2017            Courtney Garcia a [age].o. female admitted on 05/26/2017 with a primary diagnosis of suicidal ideation, alcohol intoxication, cirrhosis, and thrombocytopenia.     She has a primary medical history including HCV, cirrhosis, EtOH abuse, seizure disorder who presented to the ED with complaints of  being very stressed due to the recent sudden death of her boyfriend. She felt like she was going to have a seizure. She reports a history of seizure disorder on Topamax and Dilantin but has not been taking the Dilantin for over a month. She drinks 1-2 liters of vodka a day - currently on Librium and PRN Ativan. She has a history of cirrhosis and thrombocytopenia. Platelets 7,000 today. We have been consulted for her thrombocytopenia.       RECOMMENDATIONS:  Thrombocytopenia with Cirrhosis   * DIC panel, peripheral smear pending, ultrasound liver/spleen, iron studies  * Keep platelets above 10,000     * Will check Ca-19-9 and AFP as she was told 3 years ago she had "cancer of the pancreas."   * Obtain records from [facility]       Lab studies and imaging studies were personally reviewed.  Pertinent old records were reviewed.    Thank you for allowing [place] to participate in the care of Courtney Garcia. Will follow along with you.       Moshe Salisbury, NP   [facility]  [address] [place] [address]  Office : [phone]  Fax : [phone]         Attending Addendum:  I have personally performed a face to face diagnostic evaluation on this patient. I have reviewed and agree with the care plan as documented by  Chales Abrahams, N.P.  My findings are as follows: Patient has severe thrombocytopenia and h/o cirrhosis, appears fatigued, heart rate regular without murmurs, abdomen is non-tender, bowel sounds are positive. 69 female h/o HCV, cirrhosis, ongoing EtOH abuse, seizure disorder, now  admitted w stress after recent boyfriend passing. We are consulted for severe thrombocytopenia: this is likely cirrhosis and ongoing ETOH abuse related: will get peripheral blood smear, DIC panel as well as abd imaging to assess liver and spleen. Will also check AFP and iron stores. Transfuse if bleeding or plt's drop below 10,000. She also reports being told of 'possible pancreas cancer' 3 years ag during a hospitalization: will try to get past records.     Thank you for allowing [place] to participate in care of this pleasant patient. Please call w any questions.                  Letha Cape, MD  [facility]  [address] [place] [place] [address]  Office : [phone]  Fax : [phone]

## 2017-05-28 NOTE — Progress Notes (Signed)
Bedside and Verbal shift change report given to self (oncoming nurse) by Barbara Cower, RN (offgoing nurse). Report included the following information SBAR, Kardex, Intake/Output, MAR, Recent Results and Cardiac Rhythm NSR.

## 2017-05-28 NOTE — Progress Notes (Signed)
Problem: Falls - Risk of  Goal: *Absence of Falls  Document Schmid Fall Risk and appropriate interventions in the flowsheet.   Outcome: Progressing Towards Goal  Fall Risk Interventions:            Medication Interventions: Patient to call before getting OOB              Patient progressing towards goal with no falls on current admission. Patient without confusion, agitation, or sensory perception deficits. Patient has steady gait on ambulation.   Personal belongings are within reach. Bed is in the low and locked position with side rails up x2. Yellow gripper socks to bilateral feet. Call light within reach and patient verbalizes understanding of use.       Problem: Seizure Disorder (Adult)  Goal: *STG: Remains free of seizure activity  Outcome: Progressing Towards Goal  Patient has been free of seizures on current shift.

## 2017-05-28 NOTE — Consults (Signed)
Consults by Letha Cape, MD at 05/28/17 1409                Author: Letha Cape, MD  Service: Nurse Practitioner  Author Type: Physician       Filed: 05/28/17 2209  Date of Service: 05/28/17 1409  Status: Addendum          Editor: Letha Cape, MD (Physician)          Related Notes: Original Note by Moshe Salisbury, NP (Nurse Practitioner) filed at 05/28/17  1500            Consult Orders        1. IP CONSULT TO HEMATOLOGY [[id number]] ordered by Angela Adam, MD at 05/27/17 [facility]      Inpatient Hematology / Oncology Consult Note      Reason for Consult:  Alcohol withdrawal [facility])   Referring Physician:  Angela Adam, MD      History of Present Illness:   Courtney Garcia is a  46 y.o. female admitted on 05/26/2017  with a primary diagnosis of suicidal ideation, alcohol intoxication, cirrhosis, and thrombocytopenia.       She has a primary medical history including HCV, cirrhosis, EtOH abuse, seizure disorder who presented to the ED with complaints of being very stressed due to the recent sudden death of her boyfriend. She felt like she was going to have a seizure. She  reports a history of seizure disorder on Topamax and Dilantin but has not been taking the Dilantin for over a month. She drinks 1-2 liters of vodka a day - currently on Librium and PRN Ativan. She has a history of cirrhosis and thrombocytopenia. Platelets  7,000 today. Of note, she states she was told she had pancreatic cancer about 3 years ago. Denies ever having a biopsy. We have been consulted for her thrombocytopenia.       Review of Systems:      Constitutional  Denies fever, chills, weight loss, appetite changes.    + fatigue, weakness.         HEENT  Denies trauma, blurry vision, hearing loss, ear pain, nosebleeds, sore throat, neck pain and ear discharge.         Skin  Denies lesions or rashes.     Lungs  Denies dyspnea, cough, sputum production or hemoptysis.      Cardiovascular  Denies chest pain, palpitations, or lower extremity edema.     Gastrointestinal  Denies nausea, vomiting, changes in bowel habits, bloody or black stools, abdominal pain.      GU  Denies dysuria, frequency or hesitancy of urination.     Neuro  Denies headaches, visual changes or ataxia. Denies dizziness, tingling, sensory change, speech change, focal weakness or headaches. + tremors.         Hematology  + easy bruising or bleeding, epistaxis.     Endo  Denies heat/cold intolerance, denies diabetes or thyroid abnormalities.     MSK  Denies back pain, arthralgias, myalgias or frequent falls.        Psychiatric/Behavioral  + depression, substance abuse, nervous/anxious.              No Known Allergies     Past Medical History:  Diagnosis  Date         ?  Cancer [facility])            liver and pancreatic         ?  Cirrhosis ([place])       ?  Infectious disease            Hep C         ?  Seizures ([place])            Past Surgical History:         Procedure  Laterality  Date          ?  HX ORTHOPAEDIC              right shoulder        No family history on file.     Social History          Social History         ?  Marital status:  MARRIED              Spouse name:  N/A         ?  Number of children:  N/A         ?  Years of education:  N/A          Occupational History        ?  Not on file.          Social History Main Topics         ?  Smoking status:  Never Smoker     ?  Smokeless tobacco:  Not on file         ?  Alcohol use  Yes                Comment: "at least a gallon of vodka a day"         ?  Drug use:  Yes              Special:  Cocaine         ?  Sexual activity:  Not on file           Other Topics  Concern        ?  Not on file          Social History Narrative        ?  No narrative on file          Current Facility-Administered Medications             Medication  Dose  Route  Frequency  Provider  Last Rate  Last Dose              ?  tuberculin injection 5 Units   5 Units  IntraDERMal  ONCE   Angela Adam, MD           ?  chlordiazePOXIDE (LIBRIUM) capsule 25 mg   25 mg  Oral  QID  Mamie Nick, MD     25 mg at 05/28/17 0941     ?  thiamine HCL (B-1) tablet 100 mg   100 mg  Oral  DAILY  Mamie Nick, MD     100 mg at 05/28/17 0941              ?  multivitamin w ZN (STRESSTABS W ZINC) tablet   1 Tab  Oral  DAILY  Mamie Nick, MD  1 Tab at 05/28/17 0941              ?  sodium chloride (NS) flush 5-10 mL   5-10 mL  IntraVENous  Q8H  Angela Adam, MD     5 mL at 05/28/17 0526     ?  sodium chloride (NS) flush 5-10 mL   5-10 mL  IntraVENous  PRN  Angela Adam, MD           ?  folic acid (FOLVITE) tablet 1 mg   1 mg  Oral  DAILY  Angela Adam, MD     1 mg at 05/28/17 0941     ?  phenytoin ER (DILANTIN ER) ER capsule 300 mg   300 mg  Oral  QHS  Angela Adam, MD     300 mg at 05/27/17 2124     ?  topiramate (TOPAMAX) tablet 25 mg   25 mg  Oral  BID WITH MEALS  Angela Adam, MD     25 mg at 05/28/17 0941     ?  influenza vaccine 2018-19 (6 mos+)(PF) (FLUARIX QUAD/FLULAVAL QUAD) injection 0.5 mL   0.5 mL  IntraMUSCular  PRIOR TO DISCHARGE  Angela Adam, MD           ?  LORazepam (ATIVAN) tablet 1-2 mg   1-2 mg  Oral  Q1H  Angela Adam, MD     Stopped at 05/28/17 1000              ?  LORazepam (ATIVAN) tablet 3-4 mg   3-4 mg  Oral  Q1H  Angela Adam, MD     Stopped at 05/28/17 0100           OBJECTIVE:   Patient Vitals for the past 8 hrs:            BP  Temp  Pulse  Resp  SpO2     05/28/17 1214  126/86  97.1 ??F (36.2 ??C)  (!) 108  16  94 %     05/28/17 0822  (!) 143/97  99.5 ??F (37.5 ??C)  (!) 105  16  92 %        Temp (24hrs), Avg:98.8 ??F (37.1 ??C), Min:97.1 ??F (36.2 ??C), Max:99.5 ??F (37.5 ??C)      09/19 0701 - 09/19 1900   In: 100 [P.O.:100]   Out: -       Physical Exam:      Constitutional:  Well developed, well nourished female in no acute  distress, lying comfortably in the hospital bed.         HEENT:  Normocephalic and atraumatic. Oropharynx is clear, mucous membranes are moist.  Extraocular muscles are  intact.  Sclerae anicteric. Neck supple.      Lymph node     No palpable submandibular, cervical, supraclavicular, axillary or inguinal lymph nodes.     Skin  Warm and dry.  + bruising. No rash noted.  No erythema.  No pallor.         Respiratory  Lungs are clear to auscultation bilaterally without wheezes, rales or rhonchi, normal air exchange without accessory muscle use.         CVS  Tachycardic rate, regular rhythm and normal S1 and S2.  No murmurs, gallops, or rubs.     Abdomen  Soft, nontender and nondistended, normoactive bowel sounds.  No palpable mass.  No hepatosplenomegaly.     Neuro  Grossly nonfocal with no obvious sensory or  motor deficits. + tremors.      MSK  Normal range of motion in general.  No edema and no tenderness.     Psych  Flat mood and affect.            Labs:       Recent Results (from the past 24 hour(s))     METABOLIC PANEL, COMPREHENSIVE          Collection Time: 05/28/17  8:36 AM         Result  Value  Ref Range            Sodium  135 (L)  136 - 145 mmol/L       Potassium  5.1  3.5 - 5.1 mmol/L       Chloride  104  98 - 107 mmol/L       CO2  20 (L)  21 - 32 mmol/L       Anion gap  11  7 - 16 mmol/L       Glucose  89  65 - 100 mg/dL       BUN  6  6 - 23 MG/DL       Creatinine  0.64  0.6 - 1.0 MG/DL       GFR est AA  >60  >60 ml/min/1.88m       GFR est non-AA  >60  >60 ml/min/1.771m      Calcium  8.7  8.3 - 10.4 MG/DL       Bilirubin, total  3.2 (H)  0.2 - 1.1 MG/DL       ALT (SGPT)  39  12 - 65 U/L       AST (SGOT)  175 (H)  15 - 37 U/L       Alk. phosphatase  200 (H)  50 - 136 U/L       Protein, total  8.4 (H)  6.3 - 8.2 g/dL       Albumin  2.5 (L)  3.5 - 5.0 g/dL       Globulin  5.9 (H)  2.3 - 3.5 g/dL       A-G Ratio  0.4 (L)  1.2 - 3.5         CBC WITH AUTOMATED DIFF          Collection Time: 05/28/17  8:36 AM         Result  Value  Ref Range            WBC  3.4 (L)  4.3 - 11.1 K/uL       RBC  4.15  4.05 - 5.2 M/uL       HGB  13.3  11.7 - 15.4 g/dL       HCT  39.7  35.8 - 46.3 %        MCV  95.7  79.6 - 97.8 FL       MCH  32.0  26.1 - 32.9 PG       MCHC  33.5  31.4 - 35.0 g/dL       RDW  15.4  %       PLATELET  7 (LL)  150 - 450 K/uL       MPV  12.1  9.4 - 12.3 FL       ABSOLUTE NRBC  0.00  0.0 - 0.2 K/uL       DF  AUTOMATED          NEUTROPHILS  62  43 - 78 %       LYMPHOCYTES  22  13 - 44 %       MONOCYTES  13 (H)  4.0 - 12.0 %       EOSINOPHILS  2  0.5 - 7.8 %       BASOPHILS  1  0.0 - 2.0 %       IMMATURE GRANULOCYTES  0  0.0 - 5.0 %       ABS. NEUTROPHILS  2.1  1.7 - 8.2 K/UL       ABS. LYMPHOCYTES  0.7  0.5 - 4.6 K/UL       ABS. MONOCYTES  0.5  0.1 - 1.3 K/UL       ABS. EOSINOPHILS  0.1  0.0 - 0.8 K/UL       ABS. BASOPHILS  0.0  0.0 - 0.2 K/UL            ABS. IMM. GRANS.  0.0  0.0 - 0.5 K/UL           Imaging:   None       ASSESSMENT:      Problem List   Never Reviewed                Codes  Class  Noted             Alcohol abuse  ICD-10-CM: F10.10   ICD-9-CM: 305.00    05/27/2017                       Seizure disorder ([place])  ICD-10-CM: G40.909   ICD-9-CM: 345.90    05/27/2017                       Cirrhosis ([place])  ICD-10-CM: K74.60   ICD-9-CM: 571.5    05/27/2017                       Hep C w/o coma, chronic ([place])  ICD-10-CM: B18.2   ICD-9-CM: 070.54    05/27/2017                       Thrombocytopenia ([place])  ICD-10-CM: D69.6   ICD-9-CM: 287.5    05/27/2017                       * (Principal)Alcohol withdrawal ([place])  ICD-10-CM: N39.767   ICD-9-CM: 291.81    05/27/2017                       Courtney Garcia is a 46 y.o. female admitted on 05/26/2017 with a primary diagnosis of suicidal ideation, alcohol intoxication, cirrhosis, and thrombocytopenia.       She has a primary medical history including HCV, cirrhosis, EtOH abuse, seizure disorder who presented to the ED with complaints of being very stressed due to the recent sudden death of her boyfriend. She felt like she was going to have a seizure. She  reports a history of seizure disorder on Topamax and Dilantin but has not been taking the Dilantin  for over a month. She drinks 1-2 liters of vodka a day - currently on Librium and PRN Ativan. She has a history of cirrhosis and thrombocytopenia. Platelets  7,000 today. We have been consulted for her thrombocytopenia.          RECOMMENDATIONS:   Thrombocytopenia with Cirrhosis    *  DIC panel, peripheral smear pending, ultrasound liver/spleen, iron studies   * Keep platelets above 10,000       * Will check Ca-19-9 and AFP as she was told 3 years ago she had "cancer of the pancreas."    * Obtain records from [facility]          Lab studies and imaging studies were personally reviewed.  Pertinent old records were reviewed.      Thank you for allowing [place] to participate in the care of Ms.  Garcia. Will follow along with you.            Moshe Salisbury, NP    [facility]   [address] [place] [address]   Office : [phone]   Fax : [phone]             Attending Addendum:   I have personally performed a face to face diagnostic evaluation on this patient. I have reviewed and agree with the care plan as documented by  Chales Abrahams , N.P.  My findings are as follows: Patient has severe thrombocytopenia and h/o  cirrhosis, appears fatigued, heart rate regular without murmurs, abdomen  is non-tender, bowel sounds are positive. 83 female h/o HCV, cirrhosis, ongoing EtOH abuse, seizure disorder, now admitted w stress after recent boyfriend passing. We are consulted for severe thrombocytopenia:  this is likely cirrhosis and ongoing ETOH abuse related: will get peripheral blood smear, DIC panel as well as abd imaging to assess liver and spleen. Will also check AFP and iron stores. Transfuse if bleeding or plt's drop below 10,000. She also reports  being told of 'possible pancreas cancer' 3 years ag during a hospitalization: will try to get past records.       Thank you for allowing [place] to participate in care of this pleasant patient. Please call w any questions.                            Letha Cape, MD   [facility]   [address] [place] [address]   Office : [phone]   Fax : [phone]

## 2017-05-29 ENCOUNTER — Inpatient Hospital Stay: Admit: 2017-05-29 | Payer: Self-pay

## 2017-05-29 LAB — VITAMIN B12: Vitamin B12: 1241 pg/mL — ABNORMAL HIGH (ref 193–986)

## 2017-05-29 LAB — BLOOD TYPE, (ABO+RH)
ABO/Rh(D): O POS
ABO/Rh: O POS

## 2017-05-29 LAB — CBC WITH AUTOMATED DIFF
ABS. BASOPHILS: 0 10*3/uL (ref 0.0–0.2)
ABS. EOSINOPHILS: 0.1 10*3/uL (ref 0.0–0.8)
ABS. IMM. GRANS.: 0 10*3/uL (ref 0.0–0.5)
ABS. LYMPHOCYTES: 0.5 10*3/uL (ref 0.5–4.6)
ABS. MONOCYTES: 0.5 10*3/uL (ref 0.1–1.3)
ABS. NEUTROPHILS: 3.5 10*3/uL (ref 1.7–8.2)
ABSOLUTE NRBC: 0 10*3/uL (ref 0.0–0.2)
BASOPHILS: 1 % (ref 0.0–2.0)
EOSINOPHILS: 2 % (ref 0.5–7.8)
HCT: 35.9 % (ref 35.8–46.3)
HGB: 12.2 g/dL (ref 11.7–15.4)
IMMATURE GRANULOCYTES: 0 % (ref 0.0–5.0)
LYMPHOCYTES: 12 % — ABNORMAL LOW (ref 13–44)
MCH: 32.3 PG (ref 26.1–32.9)
MCHC: 34 g/dL (ref 31.4–35.0)
MCV: 95 FL (ref 79.6–97.8)
MONOCYTES: 10 % (ref 4.0–12.0)
MPV: 9.9 FL (ref 9.4–12.3)
NEUTROPHILS: 76 % (ref 43–78)
PLATELET: 37 10*3/uL — ABNORMAL LOW (ref 150–450)
RBC: 3.78 M/uL — ABNORMAL LOW (ref 4.05–5.2)
RDW: 15.3 %
WBC: 4.6 10*3/uL (ref 4.3–11.1)

## 2017-05-29 LAB — FOLATE: Folate: 17.8 ng/mL — ABNORMAL HIGH (ref 3.1–17.5)

## 2017-05-29 LAB — METABOLIC PANEL, COMPREHENSIVE
A-G Ratio: 0.5 — ABNORMAL LOW (ref 1.2–3.5)
ALT (SGPT): 29 U/L (ref 12–65)
AST (SGOT): 102 U/L — ABNORMAL HIGH (ref 15–37)
Albumin: 2.7 g/dL — ABNORMAL LOW (ref 3.5–5.0)
Alk. phosphatase: 186 U/L — ABNORMAL HIGH (ref 50–136)
Anion gap: 14 mmol/L (ref 7–16)
BUN: 6 MG/DL (ref 6–23)
Bilirubin, total: 3.4 MG/DL — ABNORMAL HIGH (ref 0.2–1.1)
CO2: 19 mmol/L — ABNORMAL LOW (ref 21–32)
Calcium: 8.3 MG/DL (ref 8.3–10.4)
Chloride: 103 mmol/L (ref 98–107)
Creatinine: 0.57 MG/DL — ABNORMAL LOW (ref 0.6–1.0)
GFR est AA: >60 mL/min/{1.73_m2} (ref >60)
GFR est non-AA: >60 mL/min/{1.73_m2} (ref >60)
Globulin: 5.1 g/dL — ABNORMAL HIGH (ref 2.3–3.5)
Glucose: 84 mg/dL (ref 65–100)
Potassium: 3.2 mmol/L — ABNORMAL LOW (ref 3.5–5.1)
Protein, total: 7.8 g/dL (ref 6.3–8.2)
Sodium: 136 mmol/L (ref 136–145)

## 2017-05-29 LAB — PLEASE READ & DOCUMENT PPD TEST IN 24 HRS: mm Induration: 0 mm

## 2017-05-29 LAB — HIV-1,2 P24 AG/AB SCREEN
HIV-1,2 AB: NONREACTIVE
HIV-1,2 Ab: NONREACTIVE
P24 Antigen: NONREACTIVE
p24 Antigen: NONREACTIVE

## 2017-05-29 MED ORDER — LORAZEPAM 1 MG TAB
1 mg | ORAL | Status: DC | PRN
Start: 2017-05-29 — End: 2017-05-29

## 2017-05-29 MED ORDER — LORAZEPAM 1 MG TAB
1 mg | ORAL | Status: DC | PRN
Start: 2017-05-29 — End: 2017-05-30
  Administered 2017-05-29 – 2017-05-30 (×15): via ORAL

## 2017-05-29 MED ORDER — SALINE PERIPHERAL FLUSH PRN
Freq: Once | INTRAMUSCULAR | Status: AC
Start: 2017-05-29 — End: 2017-05-29
  Administered 2017-05-29: 21:00:00

## 2017-05-29 MED ORDER — LORAZEPAM 1 MG TAB
1 mg | ORAL | Status: DC | PRN
Start: 2017-05-29 — End: 2017-05-28

## 2017-05-29 MED ORDER — IBUPROFEN 400 MG TAB
400 mg | Freq: Four times a day (QID) | ORAL | Status: DC | PRN
Start: 2017-05-29 — End: 2017-05-30
  Administered 2017-05-29: 08:00:00 via ORAL

## 2017-05-29 MED ORDER — SODIUM CHLORIDE 0.9% BOLUS IV
0.9 % | Freq: Once | INTRAVENOUS | Status: AC
Start: 2017-05-29 — End: 2017-05-29
  Administered 2017-05-29: 21:00:00 via INTRAVENOUS

## 2017-05-29 MED ORDER — POTASSIUM CHLORIDE SR 20 MEQ TAB, PARTICLES/CRYSTALS
20 mEq | ORAL | Status: AC
Start: 2017-05-29 — End: 2017-05-29
  Administered 2017-05-29: 18:00:00 via ORAL

## 2017-05-29 MED ORDER — IOPAMIDOL 76 % IV SOLN
370 mg iodine /mL (76 %) | Freq: Once | INTRAVENOUS | Status: AC
Start: 2017-05-29 — End: 2017-05-29
  Administered 2017-05-29: 21:00:00 via INTRAVENOUS

## 2017-05-29 MED FILL — THIAMINE HCL 100 MG TAB: 100 mg | ORAL | Qty: 1

## 2017-05-29 MED FILL — IBUPROFEN 400 MG TAB: 400 mg | ORAL | Qty: 1

## 2017-05-29 MED FILL — LORAZEPAM 1 MG TAB: 1 mg | ORAL | Qty: 1

## 2017-05-29 MED FILL — POTASSIUM CHLORIDE SR 20 MEQ TAB, PARTICLES/CRYSTALS: 20 mEq | ORAL | Qty: 2

## 2017-05-29 MED FILL — CHLORDIAZEPOXIDE 25 MG CAP: 25 mg | ORAL | Qty: 1

## 2017-05-29 MED FILL — FOLIC ACID 1 MG TAB: 1 mg | ORAL | Qty: 1

## 2017-05-29 MED FILL — TOPIRAMATE 25 MG TAB: 25 mg | ORAL | Qty: 1

## 2017-05-29 MED FILL — LORAZEPAM 1 MG TAB: 1 mg | ORAL | Qty: 2

## 2017-05-29 MED FILL — STRESS FORMULA WITH ZINC TABLET: ORAL | Qty: 1

## 2017-05-29 MED FILL — PHENYTOIN SODIUM EXTENDED 100 MG CAP: 100 mg | ORAL | Qty: 3

## 2017-05-29 NOTE — Progress Notes (Signed)
Pt resting in bed alert and oriented, cooperative with care, no visual s/s of pain or distress noted, call light within reach safety measures in place.

## 2017-05-29 NOTE — Progress Notes (Signed)
Scheduled dose of librium given

## 2017-05-29 NOTE — Progress Notes (Signed)
22g inserted first attempt left wrist

## 2017-05-29 NOTE — Progress Notes (Signed)
CT pancreatic protocol requires 20g AC IV. Clydie BraunKaren notified.

## 2017-05-29 NOTE — Progress Notes (Signed)
Ativan 1mg po

## 2017-05-29 NOTE — Progress Notes (Addendum)
[facility] On[facility]        Inpatient Hematology / Oncology Progress Note      Admission Date: 05/26/2017  4:04 PM  Reason for Admission/Hospital Course: Alcohol withdrawal (HCC)      24 Hour Events:  Afebrile, VSS  Plt up to 37k s/p transfusion    ROS:  Constitutional: Negative for fever, chills, weakness, malaise, fatigue.  CV: Negative for chest pain, palpitations, edema.  Respiratory: Negative for dyspnea, cough, wheezing.  GI: Negative for nausea, abdominal pain, diarrhea.    10 point review of systems is otherwise negative with the exception of the elements mentioned above in the HPI.     No Known Allergies    OBJECTIVE:  Patient Vitals for the past 8 hrs:   BP Temp Pulse Resp SpO2   05/29/17 1120 124/90 98.5 ??F (36.9 ??C) 99 20 92 %   05/29/17 0803 127/86 98.5 ??F (36.9 ??C) 93 20 93 %   05/29/17 0450 - 98.7 ??F (37.1 ??C) - - -     Temp (24hrs), Avg:98.5 ??F (36.9 ??C), Min:97.1 ??F (36.2 ??C), Max:100.1 ??F (37.8 ??C)         Physical Exam:  Constitutional: Well developed, well nourished female in no acute distress, lying comfortably in the hospital bed.    HEENT: Normocephalic and atraumatic. Oropharynx is clear, mucous membranes are moist.  Extraocular muscles are intact.  Sclerae anicteric. Neck supple.    Lymph node Deferred   Skin Warm and dry.  + bruising. No rash noted.  No erythema.  No pallor.    Respiratory Lungs are clear to auscultation bilaterally without wheezes, rales or rhonchi, normal air exchange without accessory muscle use.    CVS Tachycardic rate, regular rhythm and normal S1 and S2.  No murmurs, gallops, or rubs.   Abdomen Soft, nontender and nondistended, normoactive bowel sounds.  No palpable mass.  No hepatosplenomegaly.   Neuro Grossly nonfocal with no obvious sensory or motor deficits. + tremors.    MSK Normal range of motion in general.  No edema and no tenderness.   Psych Flat mood and affect.    ??      Labs:    Recent Labs      05/29/17   0508  05/28/17   0836  05/27/17    1153   05/26/17   1657   WBC  4.6  3.4*  3.6*   < >  4.4   RBC  3.78*  4.15  3.86*   < >  4.04*   HGB  12.2  13.3  12.6   < >  13.0   HCT  35.9  39.7  36.5   < >  39.4   MCV  95.0  95.7  94.6   < >  97.5   MCH  32.3  32.0  32.6   < >  32.2   MCHC  34.0  33.5  34.5   < >  33.0   RDW  15.3  15.4  15.3   < >  15.9   PLT  37*  7*  11*   < >  15*   GRANS  76  62   --    --   48   LYMPH  12*  22   --    --   34   MONOS  10  13*   --    --   14*   EOS  2  2   --    --  2   BASOS  1  1   --    --   1   IG  0  0   --    --   1   DF  AUTOMATED  AUTOMATED   --    --   AUTOMATED   ANEU  3.5  2.1   --    --   2.2   ABL  0.5  0.7   --    --   1.5   ABM  0.5  0.5   --    --   0.6   ABE  0.1  0.1   --    --   0.1   ABB  0.0  0.0   --    --   0.0   AIG  0.0  0.0   --    --   0.0    < > = values in this interval not displayed.      Recent Labs      05/29/17   0508  05/28/17   0836  05/27/17   0907   NA  136  135*  139   K  3.2*  5.1  3.5   CL  103  104  106   CO2  19*  20*  22   AGAP  GLU  84  89  96   BUN  6  6  4*   CREA  0.57*  0.64  0.56*   GFRAA  >60  >60  >60   GFRNA  >60  >60  >60   CA  8.3  8.7  8.1*   SGOT  102*  175*  157*   AP  186*  200*  196*   TP  7.8  8.4*  8.6*   ALB  2.7*  2.5*  2.9*   GLOB  5.1*  5.9*  5.7*   AGRAT  0.5*  0.4*  0.5*         Imaging:  [place] ABD COMP [[id number]] Collected: 05/28/17 1939    ?? Order Status: Completed Updated: 05/28/17 1942   ?? Narrative: ??   ?? Examination: ??Abdominal ultrasound complete    History: severe thrombocytopenia, hx cirrhosis, 45 years Female alcohol  withdrawal, cirrhosis, severe thrombocytopenia    Comparison: None available    Findings: Liver has a diffusely hyperechoic echotexture with no evidence of  focal mass or intrahepatic ductal dilatation, the liver measures 17.3 CM in  mid-clavicular length. ??Normal hepatopedal flow within the main portal vein.   Gallbladder appears normal with no evidence of gallstones, wall thickening, or   pericholecystic fluid. ??Negative sonographic Murphy sign. Proximal common duct  measures 5 mm. ??Kidneys appear normal with no evidence of renal calculi,  hydronephrosis, or focal scarring. The right kidney measures approximately 10.4  cm. The left kidney measures approximately 12.5 cm. Partially visualized head  and body of pancreas unremarkable. Visualized portions of the aorta are normal  in caliber. Proximal IVC is patent. ??Spleen appears borderline enlarged and  measures approximately 13.1 cm. No ascites or pleural effusions.     ?? Impression: ??   ?? Impression:  Diffusely hyperechoic liver with borderline splenomegaly, consistent with  clinical history of cirrhosis, without focal liver mass seen.         ASSESSMENT:    Problem List  Never Reviewed          Codes Class Noted  Alcohol abuse ICD-10-CM: F10.10  ICD-9-CM: 305.00  05/27/2017        Seizure disorder (HCC) ICD-10-CM: G40.909  ICD-9-CM: 345.90  05/27/2017        Cirrhosis (HCC) ICD-10-CM: K74.60  ICD-9-CM: 571.5  05/27/2017        Hep C w/o coma, chronic (HCC) ICD-10-CM: B18.2  ICD-9-CM: 070.54  05/27/2017        Thrombocytopenia (HCC) ICD-10-CM: D69.6  ICD-9-CM: 287.5  05/27/2017        * (Principal)Alcohol withdrawal [facility](HCC) ICD-10-CM: Z61.096F10.239  ICD-9-CM: 291.81  05/27/2017            Ms. Courtney Garcia is a 46 y.o. female admitted on 05/26/2017 with a primary diagnosis of suicidal ideation, alcohol intoxication, cirrhosis, and thrombocytopenia.   ??  She has a primary medical history including HCV, cirrhosis, EtOH abuse, seizure disorder who presented to the ED with complaints of being very stressed due to the recent sudden death of her boyfriend. She felt like she was going to have a seizure. She reports a history of seizure disorder on Topamax and Dilantin but has not been taking the Dilantin for over a month. She drinks 1-2 liters of vodka a day - currently on Librium and PRN Ativan. She has a history of cirrhosis and thrombocytopenia. Platelets  7,000 today. Of note, she states she was told she had pancreatic cancer about 3 years ago. Denies ever having a biopsy. We have been consulted for her thrombocytopenia.     PLAN:  Thrombocytopenia with Cirrhosis   * DIC panel, peripheral smear pending, ultrasound liver/spleen, iron studies  * Keep platelets above 10,000   9/20 Plt up to 37k after transfusion.  No iron deficiency noted.  Smear unremarkable.  Abd US with diffusely hyperechoic liver c/w cirrhosis and borderline splenomegaly.  Thrombocytopenia felt to be r/t cirrhosis and borderline splenomegaly.  Transfuse to keep platelets >10k or >50k with any active bleeding.  Check CT AP.  ??  * Will check Ca-19-9 and AFP as she was told 3 years ago she had "cancer of the pancreas."   * [facility]   9/20 Cancer markers ok.  Will work to obtain records.    Thank you for allowing us to participate in the care of Ms. Delpizzo.  We will sign off; please do not hesitate to call with any questions.  We will arrange f/u appt with Dr. Rose FillersQuddus within 3 weeks.                Trisha Mangleana Laird, NP   [facility]  [address]  [place] [id number]  Office : [phone]  Fax : [phone]     ??  Attending Addendum:  I have personally performed a face to face diagnostic evaluation on this patient. I have reviewed and agree with the care plan as documented by??Trisha Mangleana laird, N.P.?? My findings are as follows:??Patient has severe thrombocytopenia and h/o cirrhosis, appears fatigued, heart rate regular without murmurs, abdomen is non-tender, bowel sounds are positive. [age] female h/o HCV, cirrhosis, ongoing EtOH abuse, seizure disorder, now admitted w stress after recent boyfriend passing. We are consulted for severe thrombocytopenia: this is likely cirrhosis and ongoing ETOH abuse related: peripheral blood smear unemarkalbe, plt's better after transfusion, Abd US w cirrhosis and mild splenomegaly. AFP and iron stores  normal. Transfuse if bleeding or plt's drop below 10,000. She also reports being told of 'possible pancreas cancer' 3 years ag during a hospitalization: will try  to get past records. Get CT AP. She should f/u in hematology 3 weeks from discharge.   ??  Thank you for allowing [place] to participate in care of this pleasant patient. Please call w any questions.  ??  ??  ????    ??  ??  Learta Codding, MD  [facility]emat[facility]/On[facility]  [address]  [place] [address]  Office : [phone]  Fax : [phone]  ??

## 2017-05-29 NOTE — Progress Notes (Signed)
Ativan 1mg  po

## 2017-05-29 NOTE — Progress Notes (Signed)
Patient is sleeping in bed. There are no signs of distress at this time. Bed is in the lowest position wheels are locked and call light is within reach

## 2017-05-29 NOTE — Progress Notes (Signed)
Hospitalist Progress Note     Admit Date:  05/26/2017  4:04 PM   Name:  Courtney Garcia   Age:  46 y.o.  DOB:  1971/01/06   MRN:  [id number]   PCP:  None  Treatment Team: Attending Provider: Angela Adam, MD; Consulting Provider: Letha Cape, MD; Care Manager: Kenton Kingfisher. Lorel Monaco    Subjective:       Courtney Garcia is a 46 yo female with PMH of ETOH use, cirrhosis, HEPC with cirrhosis followed at [place], [place], seizures admitted with ETOH intoxication, suicidal ideation and depression. She has had a lot of recent social stressors. She has been seen by telepsyche and recommends inpatient psyche care. She has been managed with ETOH withdrawal protocol. She has profound thrombocytopenia, seen by hematology. Goal to transfuse platelets if <10K / or bleeding. ABD US showed cirrhosis and splenomegaly.       05-29-17 shaky, asking for water, NPO, has diffuse pain, no dyspnea or cough, has bruising       Objective:   Patient Vitals for the past 24 hrs:   Temp Pulse Resp BP SpO2   05/29/17 1120 98.5 ??F (36.9 ??C) 99 20 124/90 92 %   05/29/17 0803 98.5 ??F (36.9 ??C) 93 20 127/86 93 %   05/29/17 0450 98.7 ??F (37.1 ??C) - - - -   05/29/17 0327 100.1 ??F (37.8 ??C) (!) 103 20 129/88 92 %   05/29/17 0123 99 ??F (37.2 ??C) (!) 102 20 124/77 93 %   05/29/17 0023 99.2 ??F (37.3 ??C) (!) 102 24 119/80 92 %   05/29/17 0005 98.3 ??F (36.8 ??C) 99 18 106/80 93 %   05/28/17 2225 98 ??F (36.7 ??C) 98 18 109/80 92 %   05/28/17 1955 98.7 ??F (37.1 ??C) (!) 101 18 114/81 97 %   05/28/17 1726 97.9 ??F (36.6 ??C) 100 18 119/83 95 %     Oxygen Therapy  O2 Sat (%): 92 % (05/29/17 1120)  Pulse via Oximetry: 97 beats per minute (05/26/17 1553)  O2 Device: Room air (05/28/17 1815)    Intake/Output Summary (Last 24 hours) at 05/29/17 1215  Last data filed at 05/29/17 8841   Gross per 24 hour   Intake              265 ml   Output                0 ml   Net              265 ml         General:    Well nourished.  Alert.  No distress , slightly tremulous   CV:   RRR.  No murmur, rub, or gallop. No edema  Lungs:   CTAB.  No wheezing, rhonchi, or rales.  Abdomen:   Soft, nontender, nondistended.   Extremities: Warm and dry.    Skin:     No rashes or jaundice.   Neuro:  No gross focal deficits    Data Review:  I have reviewed all labs, meds, telemetry events, and studies from the last 24 hours:    Recent Results (from the past 24 hour(s))   AFP, TUMOR MARKER    Collection Time: 05/28/17  3:01 PM   Result Value Ref Range    AFP, Tumor marker 9.00 (H) <8.0 ng/mL   CANCER AG 19-9    Collection Time: 05/28/17  3:01 PM   Result Value Ref Range  Cancer antigen 19-9 166.00 (H) 2.0 - 37.0 U/mL   PTT    Collection Time: 05/28/17  3:01 PM   Result Value Ref Range    aPTT 34.1 23.2 - 35.3 SEC   FIBRINOGEN    Collection Time: 05/28/17  3:01 PM   Result Value Ref Range    Fibrinogen 258 190 - 501 mg/dL   D DIMER    Collection Time: 05/28/17  3:01 PM   Result Value Ref Range    D DIMER 1.89 (HH) <0.56 ug/ml(FEU)   TRANSFERRIN SATURATION    Collection Time: 05/28/17  3:01 PM   Result Value Ref Range    Iron 218 (H) 35 - 150 ug/dL    TIBC 251 250 - 450 ug/dL    Transferrin Saturation 87 >20 %   FERRITIN    Collection Time: 05/28/17  3:01 PM   Result Value Ref Range    Ferritin 104 8 - 388 NG/ML   PLATELETS, ALLOCATE    Collection Time: 05/28/17  6:30 PM   Result Value Ref Range    Unit number [id number]     Blood component type PLTPH, LR     Unit division 00     Status of unit ISSUED    BLOOD TYPE, (ABO+RH)    Collection Time: 05/28/17 10:56 PM   Result Value Ref Range    ABO/Rh(D) O POSITIVE    CBC WITH AUTOMATED DIFF    Collection Time: 05/29/17  5:08 AM   Result Value Ref Range    WBC 4.6 4.3 - 11.1 K/uL    RBC 3.78 (L) 4.05 - 5.2 M/uL    HGB 12.2 11.7 - 15.4 g/dL    HCT 35.9 35.8 - 46.3 %    MCV 95.0 79.6 - 97.8 FL    MCH 32.3 26.1 - 32.9 PG    MCHC 34.0 31.4 - 35.0 g/dL    RDW 15.3 %    PLATELET 37 (L) 150 - 450 K/uL    MPV 9.9 9.4 - 12.3 FL     ABSOLUTE NRBC 0.00 0.0 - 0.2 K/uL    DF AUTOMATED      NEUTROPHILS 76 43 - 78 %    LYMPHOCYTES 12 (L) 13 - 44 %    MONOCYTES 10 4.0 - 12.0 %    EOSINOPHILS 2 0.5 - 7.8 %    BASOPHILS 1 0.0 - 2.0 %    IMMATURE GRANULOCYTES 0 0.0 - 5.0 %    ABS. NEUTROPHILS 3.5 1.7 - 8.2 K/UL    ABS. LYMPHOCYTES 0.5 0.5 - 4.6 K/UL    ABS. MONOCYTES 0.5 0.1 - 1.3 K/UL    ABS. EOSINOPHILS 0.1 0.0 - 0.8 K/UL    ABS. BASOPHILS 0.0 0.0 - 0.2 K/UL    ABS. IMM. GRANS. 0.0 0.0 - 0.5 K/UL   METABOLIC PANEL, COMPREHENSIVE    Collection Time: 05/29/17  5:08 AM   Result Value Ref Range    Sodium 136 136 - 145 mmol/L    Potassium 3.2 (L) 3.5 - 5.1 mmol/L    Chloride 103 98 - 107 mmol/L    CO2 19 (L) 21 - 32 mmol/L    Anion gap 14 7 - 16 mmol/L    Glucose 84 65 - 100 mg/dL    BUN 6 6 - 23 MG/DL    Creatinine 0.57 (L) 0.6 - 1.0 MG/DL    GFR est AA >60 >60 ml/min/1.3m    GFR est non-AA >60 >60 ml/min/1.763m  Calcium 8.3 8.3 - 10.4 MG/DL    Bilirubin, total 3.4 (H) 0.2 - 1.1 MG/DL    ALT (SGPT) 29 12 - 65 U/L    AST (SGOT) 102 (H) 15 - 37 U/L    Alk. phosphatase 186 (H) 50 - 136 U/L    Protein, total 7.8 6.3 - 8.2 g/dL    Albumin 2.7 (L) 3.5 - 5.0 g/dL    Globulin 5.1 (H) 2.3 - 3.5 g/dL    A-G Ratio 0.5 (L) 1.2 - 3.5     HIV-1,2 P24 AG/AB SCREEN    Collection Time: 05/29/17 10:41 AM   Result Value Ref Range    p24 Antigen NONREACTIVE NR      HIV-1,2 Ab NONREACTIVE NR          All Micro Results     None          No results found for this visit on 05/26/17.    Current Meds:  Current Facility-Administered Medications   Medication Dose Route Frequency   ??? ibuprofen (MOTRIN) tablet 400 mg  400 mg Oral Q6H PRN   ??? saline peripheral flush soln 10 mL  10 mL InterCATHeter RAD ONCE   ??? sodium chloride 0.9 % bolus infusion 100 mL  100 mL IntraVENous RAD ONCE   ??? iopamidol (ISOVUE-370) 76 % injection 100 mL  100 mL IntraVENous RAD ONCE   ??? tuberculin injection 5 Units  5 Units IntraDERMal ONCE    ??? 0.9% sodium chloride infusion 250 mL  250 mL IntraVENous PRN   ??? LORazepam (ATIVAN) tablet 3-4 mg  3-4 mg Oral Q1H PRN   ??? LORazepam (ATIVAN) tablet 1-2 mg  1-2 mg Oral Q1H PRN   ??? chlordiazePOXIDE (LIBRIUM) capsule 25 mg  25 mg Oral QID   ??? thiamine HCL (B-1) tablet 100 mg  100 mg Oral DAILY   ??? multivitamin w ZN (STRESSTABS W ZINC) tablet  1 Tab Oral DAILY   ??? sodium chloride (NS) flush 5-10 mL  5-10 mL IntraVENous Q8H   ??? sodium chloride (NS) flush 5-10 mL  5-10 mL IntraVENous PRN   ??? folic acid (FOLVITE) tablet 1 mg  1 mg Oral DAILY   ??? phenytoin ER (DILANTIN ER) ER capsule 300 mg  300 mg Oral QHS   ??? topiramate (TOPAMAX) tablet 25 mg  25 mg Oral BID WITH MEALS   ??? influenza vaccine 2018-19 (6 mos+)(PF) (FLUARIX QUAD/FLULAVAL QUAD) injection 0.5 mL  0.5 mL IntraMUSCular PRIOR TO DISCHARGE       Other Studies (last 24 hours):  [place] Abd Comp    Result Date: 05/28/2017  Examination:  Abdominal ultrasound complete History: severe thrombocytopenia, hx cirrhosis, 46 years Female alcohol withdrawal, cirrhosis, severe thrombocytopenia Comparison: None available Findings: Liver has a diffusely hyperechoic echotexture with no evidence of focal mass or intrahepatic ductal dilatation, the liver measures 17.3 CM in mid-clavicular length.  Normal hepatopedal flow within the main portal vein. Gallbladder appears normal with no evidence of gallstones, wall thickening, or pericholecystic fluid.  Negative sonographic Murphy sign. Proximal common duct measures 5 mm.  Kidneys appear normal with no evidence of renal calculi, hydronephrosis, or focal scarring. The right kidney measures approximately 10.4 cm. The left kidney measures approximately 12.5 cm. Partially visualized head and body of pancreas unremarkable. Visualized portions of the aorta are normal in caliber. Proximal IVC is patent.  Spleen appears borderline enlarged and measures approximately 13.1 cm. No ascites or pleural effusions.      Impression:  Diffusely hyperechoic liver with borderline splenomegaly, consistent with clinical history of cirrhosis, without focal liver mass seen.      Assessment and Plan:     Hospital Problems as of 05/29/2017  Never Reviewed          Codes Class Noted - Resolved POA    Alcohol abuse ICD-10-CM: F10.10  ICD-9-CM: 305.00  05/27/2017 - Present Yes        Seizure disorder ([place]) ICD-10-CM: G40.909  ICD-9-CM: 345.90  05/27/2017 - Present Yes        Cirrhosis ([place]) ICD-10-CM: K74.60  ICD-9-CM: 571.5  05/27/2017 - Present Unknown        Hep C w/o coma, chronic ([place]) ICD-10-CM: B18.2  ICD-9-CM: 070.54  05/27/2017 - Present Unknown        Thrombocytopenia ([place]) ICD-10-CM: D69.6  ICD-9-CM: 287.5  05/27/2017 - Present Unknown        * (Principal)Alcohol withdrawal ([place]) ICD-10-CM: Z36.644  ICD-9-CM: 291.81  05/27/2017 - Present Unknown              Plan:    ?? Depression: needs inpatient psyche   ?? ETOH use: on librium, prn ativan  ?? Thrombocytopenia: apprecaite heme workup, daily CBC, transfuse < 10K or with bleeding, check HIV  ?? HEPC cirrhosis: noted   ?? Hypokalemia: replace and followup, check magnesium  ?? Seizures: continue dilantin and topamax    DC planning/Dispo:  pending   Diet:  DIET REGULAR  DVT ppx:  SCD    Signed:  Orion Modest, MD

## 2017-05-29 NOTE — Progress Notes (Signed)
Patient awake, resting in bed. Will monitor.

## 2017-05-29 NOTE — Progress Notes (Signed)
CM called [facility] a [facility] based inpatient recovery program in [place]. There will be an opening in the facility within 5-7 weeks. Patient needs to call to do the over the phone application in order to get on the waiting list. Patient believes that she has a friend that she can stay with when she discharges from the hospital until she gets into the recovery program. CM following.

## 2017-05-29 NOTE — Progress Notes (Signed)
Patient's iv is out  No bleeding at site

## 2017-05-29 NOTE — Progress Notes (Signed)
Patient is asleep  On right side  Calm  No visual tremors

## 2017-05-29 NOTE — Progress Notes (Signed)
Ativan PO 1 mg given per patient request.

## 2017-05-29 NOTE — Progress Notes (Deleted)
Ativan PO I mg given per patient request.

## 2017-05-29 NOTE — Progress Notes (Signed)
CIWA score 6  Patient given routine dose of librium and ativan 1mg  po  Assisted to bathroom and back to bed  Gait slighty unsteady  One person assist

## 2017-05-29 NOTE — Progress Notes (Signed)
Vascular access team in to start iv

## 2017-05-29 NOTE — Progress Notes (Signed)
In bed  Sleeping  Respirations unlabored  No visible tremors

## 2017-05-29 NOTE — Progress Notes (Signed)
Patient is sleeping  No tremors noticed  Respirations 20 and unlabored

## 2017-05-29 NOTE — Progress Notes (Signed)
Patient is asleep  Respirations unlabored  No visual anxiety or tremor

## 2017-05-29 NOTE — Progress Notes (Signed)
20ga iv placed in left forearm with ultrasound guidance.

## 2017-05-29 NOTE — Progress Notes (Signed)
Patient needs 20g in ac for CT  Unable to get ac line  Vascular access called for assist

## 2017-05-29 NOTE — Progress Notes (Addendum)
Patient awakened for morning meds  Slight tremors  Ativan 1mg  po CWIA scale 6

## 2017-05-30 LAB — HEPATITIS PANEL, ACUTE
Hep B Core Ab, IgM: NEGATIVE
Hep B surface Ag screen: NEGATIVE
Hep C Virus Ab: >11 s/co ratio — ABNORMAL HIGH (ref 0.0–0.9)
Hepatitis A Ab, IgM: NEGATIVE

## 2017-05-30 LAB — CBC WITH AUTOMATED DIFF
ABS. BASOPHILS: 0 10*3/uL (ref 0.0–0.2)
ABS. EOSINOPHILS: 0.1 10*3/uL (ref 0.0–0.8)
ABS. IMM. GRANS.: 0 10*3/uL (ref 0.0–0.5)
ABS. LYMPHOCYTES: 0.9 10*3/uL (ref 0.5–4.6)
ABS. MONOCYTES: 0.8 10*3/uL (ref 0.1–1.3)
ABS. NEUTROPHILS: 3.2 10*3/uL (ref 1.7–8.2)
ABSOLUTE NRBC: 0 10*3/uL (ref 0.0–0.2)
BASOPHILS: 1 % (ref 0.0–2.0)
EOSINOPHILS: 3 % (ref 0.5–7.8)
HCT: 40.3 % (ref 35.8–46.3)
HGB: 13.4 g/dL (ref 11.7–15.4)
IMMATURE GRANULOCYTES: 0 % (ref 0.0–5.0)
LYMPHOCYTES: 17 % (ref 13–44)
MCH: 31.9 PG (ref 26.1–32.9)
MCHC: 33.3 g/dL (ref 31.4–35.0)
MCV: 96 FL (ref 79.6–97.8)
MONOCYTES: 15 % — ABNORMAL HIGH (ref 4.0–12.0)
MPV: 10.6 FL (ref 9.4–12.3)
NEUTROPHILS: 64 % (ref 43–78)
PLATELET: 16 10*3/uL — CL (ref 150–450)
RBC: 4.2 M/uL (ref 4.05–5.2)
RDW: 15.9 %
WBC: 5 10*3/uL (ref 4.3–11.1)

## 2017-05-30 LAB — METABOLIC PANEL, COMPREHENSIVE
A-G Ratio: 0.5 — ABNORMAL LOW (ref 1.2–3.5)
ALT (SGPT): 27 U/L (ref 12–65)
AST (SGOT): 80 U/L — ABNORMAL HIGH (ref 15–37)
Albumin: 2.7 g/dL — ABNORMAL LOW (ref 3.5–5.0)
Alk. phosphatase: 198 U/L — ABNORMAL HIGH (ref 50–136)
Anion gap: 10 mmol/L (ref 7–16)
BUN: 6 MG/DL (ref 6–23)
Bilirubin, total: 3.1 MG/DL — ABNORMAL HIGH (ref 0.2–1.1)
CO2: 22 mmol/L (ref 21–32)
Calcium: 8.7 MG/DL (ref 8.3–10.4)
Chloride: 104 mmol/L (ref 98–107)
Creatinine: 0.68 MG/DL (ref 0.6–1.0)
GFR est AA: >60 mL/min/{1.73_m2} (ref >60)
GFR est non-AA: >60 mL/min/{1.73_m2} (ref >60)
Globulin: 5.5 g/dL — ABNORMAL HIGH (ref 2.3–3.5)
Glucose: 100 mg/dL (ref 65–100)
Potassium: 3.4 mmol/L — ABNORMAL LOW (ref 3.5–5.1)
Protein, total: 8.2 g/dL (ref 6.3–8.2)
Sodium: 136 mmol/L (ref 136–145)

## 2017-05-30 LAB — PLATELETS, ALLOCATE
Status of unit: TRANSFUSED
Unit division: 0

## 2017-05-30 LAB — MAGNESIUM: Magnesium: 1.5 mg/dL — ABNORMAL LOW (ref 1.8–2.4)

## 2017-05-30 LAB — PLEASE READ & DOCUMENT PPD TEST IN 48 HRS

## 2017-05-30 MED ORDER — LORAZEPAM 2 MG/ML IJ SOLN
2 mg/mL | INTRAMUSCULAR | Status: DC | PRN
Start: 2017-05-30 — End: 2017-06-06
  Administered 2017-05-31 – 2017-06-04 (×8): via INTRAVENOUS

## 2017-05-30 MED ORDER — BUSPIRONE 5 MG TAB
5 mg | Freq: Three times a day (TID) | ORAL | Status: DC
Start: 2017-05-30 — End: 2017-06-06
  Administered 2017-05-30 – 2017-06-06 (×23): via ORAL

## 2017-05-30 MED ORDER — LORAZEPAM 2 MG/ML IJ SOLN
2 mg/mL | INTRAMUSCULAR | Status: DC | PRN
Start: 2017-05-30 — End: 2017-05-30
  Administered 2017-05-30: 16:00:00 via INTRAVENOUS

## 2017-05-30 MED ORDER — SERTRALINE 100 MG TAB
100 mg | Freq: Every day | ORAL | Status: DC
Start: 2017-05-30 — End: 2017-06-06
  Administered 2017-05-30 – 2017-06-06 (×9): via ORAL

## 2017-05-30 MED ORDER — MAGNESIUM SULFATE 2 GRAM/50 ML IVPB
2 gram/50 mL (4 %) | Freq: Once | INTRAVENOUS | Status: AC
Start: 2017-05-30 — End: 2017-05-30
  Administered 2017-05-30: 15:00:00 via INTRAVENOUS

## 2017-05-30 MED ORDER — LORAZEPAM 2 MG/ML IJ SOLN
2 mg/mL | INTRAMUSCULAR | Status: DC | PRN
Start: 2017-05-30 — End: 2017-05-30
  Administered 2017-05-30 (×2): via INTRAVENOUS

## 2017-05-30 MED ORDER — POTASSIUM CHLORIDE SR 20 MEQ TAB, PARTICLES/CRYSTALS
20 mEq | ORAL | Status: AC
Start: 2017-05-30 — End: 2017-05-30
  Administered 2017-05-30: 15:00:00 via ORAL

## 2017-05-30 MED FILL — LORAZEPAM 2 MG/ML IJ SOLN: 2 mg/mL | INTRAMUSCULAR | Qty: 1

## 2017-05-30 MED FILL — TOPIRAMATE 25 MG TAB: 25 mg | ORAL | Qty: 1

## 2017-05-30 MED FILL — THIAMINE HCL 100 MG TAB: 100 mg | ORAL | Qty: 1

## 2017-05-30 MED FILL — LORAZEPAM 1 MG TAB: 1 mg | ORAL | Qty: 1

## 2017-05-30 MED FILL — LORAZEPAM 1 MG TAB: 1 mg | ORAL | Qty: 2

## 2017-05-30 MED FILL — BUSPIRONE 5 MG TAB: 5 mg | ORAL | Qty: 3

## 2017-05-30 MED FILL — MAGNESIUM SULFATE 2 GRAM/50 ML IVPB: 2 gram/50 mL (4 %) | INTRAVENOUS | Qty: 50

## 2017-05-30 MED FILL — FOLIC ACID 1 MG TAB: 1 mg | ORAL | Qty: 1

## 2017-05-30 MED FILL — POTASSIUM CHLORIDE SR 20 MEQ TAB, PARTICLES/CRYSTALS: 20 mEq | ORAL | Qty: 2

## 2017-05-30 MED FILL — PHENYTOIN SODIUM EXTENDED 100 MG CAP: 100 mg | ORAL | Qty: 3

## 2017-05-30 MED FILL — CHLORDIAZEPOXIDE 25 MG CAP: 25 mg | ORAL | Qty: 1

## 2017-05-30 MED FILL — STRESS FORMULA WITH ZINC TABLET: ORAL | Qty: 1

## 2017-05-30 MED FILL — BUSPIRONE 5 MG TAB: 5 mg | ORAL | Qty: 2

## 2017-05-30 MED FILL — SERTRALINE 100 MG TAB: 100 mg | ORAL | Qty: 1

## 2017-05-30 NOTE — Progress Notes (Signed)
Bedside report received from night nurse Lynne Logan. Assessment done as noted  Respiration even and unlabored 20/min; denies pain or nausea at present. Confused, agitated, anxious, climbing out of bed, pulling off tele monitor, hallucinating, talking to people not in the room, restless. Want to call EMS and go to ER. Reoriented. Ativan 2mg  PO given. [place] bed alarm on intact, and patent. Door open. Nursing staff at bedside. Will continue to monitor.

## 2017-05-30 NOTE — Progress Notes (Signed)
Ativan 2 mg PO given to this patient.

## 2017-05-30 NOTE — Progress Notes (Signed)
Hospitalist Coverage Note :    Patient has no active bleeding.     Platelet this morning is 16K.     Medications are reviewed.   I stopped Motrin.     Defer to daytime physician for further plan. As per the note from Dr. Charlotta Newtonavis-Pachter, according to hematology consultant, will transfuse with platelets if platelet count is less than 10K.

## 2017-05-30 NOTE — Progress Notes (Signed)
Pt resting in bed, alert confused, breathing even and unlabored, no visual s/s of pain or distress, safety measures, call light within reach.

## 2017-05-30 NOTE — Progress Notes (Signed)
Hospitalist Progress Note     Admit Date:  05/26/2017  4:04 PM   Name:  Courtney Garcia   Age:  46 y.o.  DOB:  1971-04-24   MRN:  [id number]   PCP:  None  Treatment Team: Attending Provider: Angela Adam, MD; Care Manager: Kenton Kingfisher. Courtney Garcia    Subjective:       Courtney Garcia is a 46 yo female with PMH of ETOH use, HEPC with cirrhosis followed at [place], [place], seizures admitted with ETOH intoxication, suicidal ideation and depression. She has had a lot of recent social stressors. She has been seen by telepsyche and recommends inpatient psyche care but not IVC. She has been managed with ETOH withdrawal protocol. She has profound thrombocytopenia, seen by hematology. Goal to transfuse platelets if <10K / or bleeding. ABD US showed cirrhosis and splenomegaly. CTAP showed hepatomegaly/ portal hypertension      05-30-17 confused, trying to climb out of bed      Objective:     Patient Vitals for the past 24 hrs:   Temp Pulse Resp BP SpO2   05/30/17 0734 98.3 ??F (36.8 ??C) (!) 107 18 98/69 93 %   05/30/17 0500 - 96 - - -   05/30/17 0423 97.4 ??F (36.3 ??C) (!) 110 18 107/65 97 %   05/29/17 2339 98.1 ??F (36.7 ??C) 99 20 106/70 93 %   05/29/17 1931 97.9 ??F (36.6 ??C) 72 18 102/60 97 %   05/29/17 1520 98.2 ??F (36.8 ??C) (!) 102 20 112/76 93 %     Oxygen Therapy  O2 Sat (%): 93 % (05/30/17 0734)  Pulse via Oximetry: 97 beats per minute (05/26/17 1553)  O2 Device: Room air (05/28/17 1815)    Intake/Output Summary (Last 24 hours) at 05/30/17 1145  Last data filed at 05/30/17 0926   Gross per 24 hour   Intake              490 ml   Output                0 ml   Net              490 ml         General:    Well nourished.  Alert.  No distress , slightly tremulous  CV:   RRR.  No murmur, rub, or gallop. No edema  Lungs:   CTAB.  No wheezing, rhonchi, or rales.  Abdomen:   Soft, nontender, nondistended.   Extremities: Warm and dry.    Skin:     No rashes or jaundice.   Neuro:  Confused and tremulous     Data Review:   I have reviewed all labs, meds, telemetry events, and studies from the last 24 hours:    Recent Results (from the past 24 hour(s))   PLEASE READ & DOCUMENT PPD TEST IN 24 HRS    Collection Time: 05/29/17  2:35 PM   Result Value Ref Range    PPD  Negative    mm Induration 0 mm   CBC WITH AUTOMATED DIFF    Collection Time: 05/30/17  5:20 AM   Result Value Ref Range    WBC 5.0 4.3 - 11.1 K/uL    RBC 4.20 4.05 - 5.2 M/uL    HGB 13.4 11.7 - 15.4 g/dL    HCT 40.3 35.8 - 46.3 %    MCV 96.0 79.6 - 97.8 FL    MCH 31.9 26.1 - 32.9 PG  MCHC 33.3 31.4 - 35.0 g/dL    RDW 15.9 %    PLATELET 16 (LL) 150 - 450 K/uL    MPV 10.6 9.4 - 12.3 FL    ABSOLUTE NRBC 0.00 0.0 - 0.2 K/uL    DF AUTOMATED      NEUTROPHILS 64 43 - 78 %    LYMPHOCYTES 17 13 - 44 %    MONOCYTES 15 (H) 4.0 - 12.0 %    EOSINOPHILS 3 0.5 - 7.8 %    BASOPHILS 1 0.0 - 2.0 %    IMMATURE GRANULOCYTES 0 0.0 - 5.0 %    ABS. NEUTROPHILS 3.2 1.7 - 8.2 K/UL    ABS. LYMPHOCYTES 0.9 0.5 - 4.6 K/UL    ABS. MONOCYTES 0.8 0.1 - 1.3 K/UL    ABS. EOSINOPHILS 0.1 0.0 - 0.8 K/UL    ABS. BASOPHILS 0.0 0.0 - 0.2 K/UL    ABS. IMM. GRANS. 0.0 0.0 - 0.5 K/UL   METABOLIC PANEL, COMPREHENSIVE    Collection Time: 05/30/17  5:20 AM   Result Value Ref Range    Sodium 136 136 - 145 mmol/L    Potassium 3.4 (L) 3.5 - 5.1 mmol/L    Chloride 104 98 - 107 mmol/L    CO2 22 21 - 32 mmol/L    Anion gap 10 7 - 16 mmol/L    Glucose 100 65 - 100 mg/dL    BUN 6 6 - 23 MG/DL    Creatinine 0.68 0.6 - 1.0 MG/DL    GFR est AA >60 >60 ml/min/1.79m    GFR est non-AA >60 >60 ml/min/1.740m   Calcium 8.7 8.3 - 10.4 MG/DL    Bilirubin, total 3.1 (H) 0.2 - 1.1 MG/DL    ALT (SGPT) 27 12 - 65 U/L    AST (SGOT) 80 (H) 15 - 37 U/L    Alk. phosphatase 198 (H) 50 - 136 U/L    Protein, total 8.2 6.3 - 8.2 g/dL    Albumin 2.7 (L) 3.5 - 5.0 g/dL    Globulin 5.5 (H) 2.3 - 3.5 g/dL    A-G Ratio 0.5 (L) 1.2 - 3.5     MAGNESIUM    Collection Time: 05/30/17  5:20 AM   Result Value Ref Range     Magnesium 1.5 (L) 1.8 - 2.4 mg/dL        All Micro Results     None          No results found for this visit on 05/26/17.    Current Meds:  Current Facility-Administered Medications   Medication Dose Route Frequency   ??? LORazepam (ATIVAN) injection 1 mg  1 mg IntraVENous Q4H PRN   ??? busPIRone (BUSPAR) tablet 15 mg  15 mg Oral TID   ??? sertraline (ZOLOFT) tablet 100 mg  100 mg Oral DAILY   ??? 0.9% sodium chloride infusion 250 mL  250 mL IntraVENous PRN   ??? chlordiazePOXIDE (LIBRIUM) capsule 25 mg  25 mg Oral QID   ??? thiamine HCL (B-1) tablet 100 mg  100 mg Oral DAILY   ??? multivitamin w ZN (STRESSTABS W ZINC) tablet  1 Tab Oral DAILY   ??? sodium chloride (NS) flush 5-10 mL  5-10 mL IntraVENous Q8H   ??? sodium chloride (NS) flush 5-10 mL  5-10 mL IntraVENous PRN   ??? folic acid (FOLVITE) tablet 1 mg  1 mg Oral DAILY   ??? phenytoin ER (DILANTIN ER) ER capsule 300 mg  300 mg Oral  QHS   ??? topiramate (TOPAMAX) tablet 25 mg  25 mg Oral BID WITH MEALS   ??? influenza vaccine 2018-19 (6 mos+)(PF) (FLUARIX QUAD/FLULAVAL QUAD) injection 0.5 mL  0.5 mL IntraMUSCular PRIOR TO DISCHARGE       Other Studies (last 24 hours):  Ct Abd Pelv W Cont    Result Date: 05/29/2017  CT of the Abdomen and Pelvis INDICATION:  Thrombocytopenia, liver disease, possible pancreas mass Multiple axial images were obtained through the abdomen and pelvis.  Oral contrast was used for bowel opacification.  172m of Isovue 370 intravenous contrast was used for better evaluation of solid organs and vascular structures.  Radiation dose reduction techniques were used for this study.  All CT scans performed at this facility use one or all of the following: Automated exposure control, adjustment of the mA and/or kVp according to patient's size, iterative reconstruction. FINDINGS: Abdomen CT: The liver is at the upper limits of normal for size, 20 cm in length.  There are nonspecific patchy areas of groundglass  opacity in both lung bases.  There are no pleural effusions.  There is marked fatty infiltration of liver.  There is no definite liver mass.  The portal vein is patent.  There is no bile duct dilatation.  There is mild distention of the gallbladder.  The spleen is normal in size, 12 cm in diameter.  The adrenal glands and pancreas appear normal.  There is normal enhancement of the kidneys. There is no hydronephrosis.  There is no adenopathy.  There is mild stranding in the retroperitoneal fat superior to the celiac artery..  Several gastric esophageal varices are also present. Pelvis CT:  There are no inflammatory changes or fluid collections in the pelvis.  There is no significant adenopathy or mass.  There are no bony lesions.     IMPRESSION: 1.  Marked steatosis, mild hepatic megaly. 2.  Retroperitoneal edema in the upper abdomen, probably due to vascular congestion associated with portal hypertension.  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  Inflammatory change associated with mild acute pancreatitis is not completely excluded.       Assessment and Plan:     Hospital Problems as of 05/30/2017  Never Reviewed          Codes Class Noted - Resolved POA    Alcohol abuse ICD-10-CM: F10.10  ICD-9-CM: 305.00  05/27/2017 - Present Yes        Seizure disorder ([place] ICD-10-CM: G40.909  ICD-9-CM: 345.90  05/27/2017 - Present Yes        Cirrhosis ([place] ICD-10-CM: K74.60  ICD-9-CM: 571.5  05/27/2017 - Present Unknown        Hep C w/o coma, chronic ([place] ICD-10-CM: B18.2  ICD-9-CM: 070.54  05/27/2017 - Present Unknown        Thrombocytopenia ([facility] ICD-10-CM: D69.6  ICD-9-CM: 287.5  05/27/2017 - Present Unknown        * (Principal)Alcohol withdrawal ([place] ICD-10-CM: FE99.371 ICD-9-CM: 291.81  05/27/2017 - Present Unknown              Plan:    ?? Depression: needs inpatient psyche but not on involuntary commitment   ?? ETOH use: on librium, prn IV ativan, currently in withdrawals   ?? Thrombocytopenia: apprecaite heme workup, daily CBC, transfuse < 10K or with bleeding  ?? HEPC cirrhosis: noted, will need followup with GI  ?? Hypokalemia/hypomagnesemia: replace and followup  ?? Seizures: continue dilantin and topamax    DC planning/Dispo:  pending   Diet:  DIET REGULAR  DVT ppx:  SCD    Signed:  Orion Modest, MD

## 2017-05-30 NOTE — Progress Notes (Signed)
.   Dr. Earlene Plater at bedside. Now in restraints per order. Will continue to monitor.

## 2017-05-30 NOTE — Progress Notes (Signed)
Ativan PO 1 mg given to this patient.

## 2017-05-30 NOTE — Progress Notes (Signed)
Pt platelet level low see MAR, Md Amphan paged and aware about this pt status.

## 2017-05-30 NOTE — Progress Notes (Signed)
Report given to oncoming nurse, patient alert with periods of confusion.

## 2017-05-30 NOTE — Progress Notes (Signed)
Ativan PO 1 mg given to this patient.

## 2017-05-30 NOTE — Progress Notes (Signed)
Resting quietly at present. NAD noted. To report off to on coming nurse.

## 2017-05-30 NOTE — Progress Notes (Signed)
PM assessment done. No changes noted. Respiration even and unlabored 20/min at rest. No s/s of pain noted at present. Door open.

## 2017-05-30 NOTE — Progress Notes (Signed)
No new orders received from Md Amphan for this patient's low platelet level.

## 2017-05-30 NOTE — Progress Notes (Signed)
Re-evaluated, appears calmer, sleepy but verbal, placing in soft restraints  Christiana Pellant, MD

## 2017-05-31 LAB — METABOLIC PANEL, COMPREHENSIVE
A-G Ratio: 0.5 — ABNORMAL LOW (ref 1.2–3.5)
ALT (SGPT): 24 U/L (ref 12–65)
AST (SGOT): 65 U/L — ABNORMAL HIGH (ref 15–37)
Albumin: 2.8 g/dL — ABNORMAL LOW (ref 3.5–5.0)
Alk. phosphatase: 197 U/L — ABNORMAL HIGH (ref 50–136)
Anion gap: 12 mmol/L (ref 7–16)
BUN: 4 MG/DL — ABNORMAL LOW (ref 6–23)
Bilirubin, total: 2.7 MG/DL — ABNORMAL HIGH (ref 0.2–1.1)
CO2: 19 mmol/L — ABNORMAL LOW (ref 21–32)
Calcium: 8.6 MG/DL (ref 8.3–10.4)
Chloride: 109 mmol/L — ABNORMAL HIGH (ref 98–107)
Creatinine: 0.61 MG/DL (ref 0.6–1.0)
GFR est AA: >60 mL/min/{1.73_m2} (ref >60)
GFR est non-AA: >60 mL/min/{1.73_m2} (ref >60)
Globulin: 5.4 g/dL — ABNORMAL HIGH (ref 2.3–3.5)
Glucose: 101 mg/dL — ABNORMAL HIGH (ref 65–100)
Potassium: 3.7 mmol/L (ref 3.5–5.1)
Protein, total: 8.2 g/dL (ref 6.3–8.2)
Sodium: 140 mmol/L (ref 136–145)

## 2017-05-31 LAB — CBC WITH AUTOMATED DIFF
ABS. BASOPHILS: 0 10*3/uL (ref 0.0–0.2)
ABS. EOSINOPHILS: 0.1 10*3/uL (ref 0.0–0.8)
ABS. IMM. GRANS.: 0 10*3/uL (ref 0.0–0.5)
ABS. LYMPHOCYTES: 1 10*3/uL (ref 0.5–4.6)
ABS. MONOCYTES: 0.9 10*3/uL (ref 0.1–1.3)
ABS. NEUTROPHILS: 4.2 10*3/uL (ref 1.7–8.2)
ABSOLUTE NRBC: 0 10*3/uL (ref 0.0–0.2)
BASOPHILS: 1 % (ref 0.0–2.0)
EOSINOPHILS: 2 % (ref 0.5–7.8)
HCT: 38.8 % (ref 35.8–46.3)
HGB: 13 g/dL (ref 11.7–15.4)
IMMATURE GRANULOCYTES: 0 % (ref 0.0–5.0)
LYMPHOCYTES: 15 % (ref 13–44)
MCH: 32 PG (ref 26.1–32.9)
MCHC: 33.5 g/dL (ref 31.4–35.0)
MCV: 95.6 FL (ref 79.6–97.8)
MONOCYTES: 15 % — ABNORMAL HIGH (ref 4.0–12.0)
MPV: 13.1 FL — ABNORMAL HIGH (ref 9.4–12.3)
NEUTROPHILS: 68 % (ref 43–78)
PLATELET: 18 10*3/uL — CL (ref 150–450)
RBC: 4.06 M/uL (ref 4.05–5.2)
RDW: 16.9 %
WBC: 6.2 10*3/uL (ref 4.3–11.1)

## 2017-05-31 LAB — PLEASE READ & DOCUMENT PPD TEST IN 72 HRS
PPD: NEGATIVE Negative
mm Induration: 0 mm

## 2017-05-31 LAB — MAGNESIUM: Magnesium: 1.6 mg/dL — ABNORMAL LOW (ref 1.8–2.4)

## 2017-05-31 MED ORDER — MAGNESIUM SULFATE 2 GRAM/50 ML IVPB
2 gram/50 mL (4 %) | Freq: Once | INTRAVENOUS | Status: AC
Start: 2017-05-31 — End: 2017-05-31
  Administered 2017-05-31: 17:00:00 via INTRAVENOUS

## 2017-05-31 MED FILL — STRESS FORMULA WITH ZINC TABLET: ORAL | Qty: 1

## 2017-05-31 MED FILL — LORAZEPAM 2 MG/ML IJ SOLN: 2 mg/mL | INTRAMUSCULAR | Qty: 1

## 2017-05-31 MED FILL — THIAMINE HCL 100 MG TAB: 100 mg | ORAL | Qty: 1

## 2017-05-31 MED FILL — BUSPIRONE 5 MG TAB: 5 mg | ORAL | Qty: 3

## 2017-05-31 MED FILL — MAGNESIUM SULFATE 2 GRAM/50 ML IVPB: 2 gram/50 mL (4 %) | INTRAVENOUS | Qty: 50

## 2017-05-31 MED FILL — CHLORDIAZEPOXIDE 25 MG CAP: 25 mg | ORAL | Qty: 1

## 2017-05-31 MED FILL — TOPIRAMATE 25 MG TAB: 25 mg | ORAL | Qty: 1

## 2017-05-31 MED FILL — PHENYTOIN SODIUM EXTENDED 100 MG CAP: 100 mg | ORAL | Qty: 3

## 2017-05-31 MED FILL — SERTRALINE 100 MG TAB: 100 mg | ORAL | Qty: 1

## 2017-05-31 MED FILL — FOLIC ACID 1 MG TAB: 1 mg | ORAL | Qty: 1

## 2017-05-31 NOTE — Progress Notes (Signed)
Respirations even and unlabored. No s/sx distress. No needs at present. No further c/o anxiety.

## 2017-05-31 NOTE — Progress Notes (Signed)
Hospitalist Progress Note     Admit Date:  05/26/2017  4:04 PM   Name:  Courtney Garcia   Age:  46 y.o.  DOB:  [birth year]   MRN:  [id number]   PCP:  None  Treatment Team: Attending Provider: Angela Adam, MD; Care Manager: Kenton Kingfisher. Lorel Monaco    Subjective:       Courtney Garcia is a 46 yo female with PMH of ETOH use, HEPC with cirrhosis followed at [place], [place], seizures admitted with ETOH intoxication, suicidal ideation and depression. She has had a lot of recent social stressors. She has been seen by telepsyche and recommends inpatient psyche care but not IVC. She has been managed with ETOH withdrawal protocol. She has profound thrombocytopenia, seen by hematology. Goal to transfuse platelets if <10K / or bleeding. ABD US showed cirrhosis and splenomegaly. CTAP showed hepatomegaly/ portal hypertension. Thrombocytopenia is likely due to ETOH/ liver disease/ needs 3 week followup.      9-22 -18 confused, thinks she had shark bite on [place] the [place], seeing ants in her bed       Objective:     Patient Vitals for the past 24 hrs:   Temp Pulse Resp BP SpO2   05/31/17 1100 97.8 ??F (36.6 ??C) 97 20 106/72 93 %   05/31/17 0700 98.9 ??F (37.2 ??C) (!) 111 20 115/76 92 %   05/31/17 0244 97.5 ??F (36.4 ??C) (!) 109 20 114/75 94 %   05/30/17 2313 97.6 ??F (36.4 ??C) (!) 101 22 109/74 96 %   05/30/17 1952 98.1 ??F (36.7 ??C) 82 20 116/73 93 %   05/30/17 1646 97.5 ??F (36.4 ??C) (!) 105 18 106/78 97 %     Oxygen Therapy  O2 Sat (%): 93 % (05/31/17 1100)  Pulse via Oximetry: 97 beats per minute (05/26/17 1553)  O2 Device: Room air (05/31/17 0710)    Intake/Output Summary (Last 24 hours) at 05/31/17 1410  Last data filed at 05/31/17 1011   Gross per 24 hour   Intake              755 ml   Output                0 ml   Net              755 ml         General:    Well nourished.  Alert.  No distress , confused   CV:   RRR.  No murmur, rub, or gallop. No edema  Lungs:   CTAB.  No wheezing, rhonchi, or rales. Anterior exam    Abdomen:   Soft, nontender, nondistended. Decreased BS  Extremities: Warm and dry.    Skin:     No rashes or jaundice.   Neuro:  Confused with visual hallucinations     Data Review:  I have reviewed all labs, meds, telemetry events, and studies from the last 24 hours:    Recent Results (from the past 24 hour(s))   CBC WITH AUTOMATED DIFF    Collection Time: 05/31/17  6:53 AM   Result Value Ref Range    WBC 6.2 4.3 - 11.1 K/uL    RBC 4.06 4.05 - 5.2 M/uL    HGB 13.0 11.7 - 15.4 g/dL    HCT 38.8 35.8 - 46.3 %    MCV 95.6 79.6 - 97.8 FL    MCH 32.0 26.1 - 32.9 PG    MCHC  33.5 31.4 - 35.0 g/dL    RDW 16.9 %    PLATELET 18 (LL) 150 - 450 K/uL    MPV 13.1 (H) 9.4 - 12.3 FL    ABSOLUTE NRBC 0.00 0.0 - 0.2 K/uL    DF AUTOMATED      NEUTROPHILS 68 43 - 78 %    LYMPHOCYTES 15 13 - 44 %    MONOCYTES 15 (H) 4.0 - 12.0 %    EOSINOPHILS 2 0.5 - 7.8 %    BASOPHILS 1 0.0 - 2.0 %    IMMATURE GRANULOCYTES 0 0.0 - 5.0 %    ABS. NEUTROPHILS 4.2 1.7 - 8.2 K/UL    ABS. LYMPHOCYTES 1.0 0.5 - 4.6 K/UL    ABS. MONOCYTES 0.9 0.1 - 1.3 K/UL    ABS. EOSINOPHILS 0.1 0.0 - 0.8 K/UL    ABS. BASOPHILS 0.0 0.0 - 0.2 K/UL    ABS. IMM. GRANS. 0.0 0.0 - 0.5 K/UL   METABOLIC PANEL, COMPREHENSIVE    Collection Time: 05/31/17  6:53 AM   Result Value Ref Range    Sodium 140 136 - 145 mmol/L    Potassium 3.7 3.5 - 5.1 mmol/L    Chloride 109 (H) 98 - 107 mmol/L    CO2 19 (L) 21 - 32 mmol/L    Anion gap 12 7 - 16 mmol/L    Glucose 101 (H) 65 - 100 mg/dL    BUN 4 (L) 6 - 23 MG/DL    Creatinine 0.61 0.6 - 1.0 MG/DL    GFR est AA >60 >60 ml/min/1.10m    GFR est non-AA >60 >60 ml/min/1.767m   Calcium 8.6 8.3 - 10.4 MG/DL    Bilirubin, total 2.7 (H) 0.2 - 1.1 MG/DL    ALT (SGPT) 24 12 - 65 U/L    AST (SGOT) 65 (H) 15 - 37 U/L    Alk. phosphatase 197 (H) 50 - 136 U/L    Protein, total 8.2 6.3 - 8.2 g/dL    Albumin 2.8 (L) 3.5 - 5.0 g/dL    Globulin 5.4 (H) 2.3 - 3.5 g/dL    A-G Ratio 0.5 (L) 1.2 - 3.5     MAGNESIUM    Collection Time: 05/31/17  6:53 AM    Result Value Ref Range    Magnesium 1.6 (L) 1.8 - 2.4 mg/dL        All Micro Results     None          No results found for this visit on 05/26/17.    Current Meds:  Current Facility-Administered Medications   Medication Dose Route Frequency   ??? LORazepam (ATIVAN) injection 1 mg  1 mg IntraVENous Q4H PRN   ??? busPIRone (BUSPAR) tablet 15 mg  15 mg Oral TID   ??? sertraline (ZOLOFT) tablet 100 mg  100 mg Oral DAILY   ??? 0.9% sodium chloride infusion 250 mL  250 mL IntraVENous PRN   ??? chlordiazePOXIDE (LIBRIUM) capsule 25 mg  25 mg Oral QID   ??? thiamine HCL (B-1) tablet 100 mg  100 mg Oral DAILY   ??? multivitamin w ZN (STRESSTABS W ZINC) tablet  1 Tab Oral DAILY   ??? sodium chloride (NS) flush 5-10 mL  5-10 mL IntraVENous Q8H   ??? sodium chloride (NS) flush 5-10 mL  5-10 mL IntraVENous PRN   ??? folic acid (FOLVITE) tablet 1 mg  1 mg Oral DAILY   ??? phenytoin ER (DILANTIN ER) ER capsule 300 mg  300 mg Oral QHS   ??? topiramate (TOPAMAX) tablet 25 mg  25 mg Oral BID WITH MEALS   ??? influenza vaccine 2018-19 (6 mos+)(PF) (FLUARIX QUAD/FLULAVAL QUAD) injection 0.5 mL  0.5 mL IntraMUSCular PRIOR TO DISCHARGE       Other Studies (last 24 hours):  No results found.    Assessment and Plan:     Hospital Problems as of 05/31/2017  Never Reviewed          Codes Class Noted - Resolved POA    Alcohol abuse ICD-10-CM: F10.10  ICD-9-CM: 305.00  05/27/2017 - Present Yes        Seizure disorder ([place]) ICD-10-CM: G40.909  ICD-9-CM: 345.90  05/27/2017 - Present Yes        Cirrhosis ([place]) ICD-10-CM: K74.60  ICD-9-CM: 571.5  05/27/2017 - Present Unknown        Hep C w/o coma, chronic ([place]) ICD-10-CM: B18.2  ICD-9-CM: 070.54  05/27/2017 - Present Unknown        Thrombocytopenia ([place]) ICD-10-CM: D69.6  ICD-9-CM: 287.5  05/27/2017 - Present Unknown        * (Principal)Alcohol withdrawal ([place]) ICD-10-CM: Z30.865  ICD-9-CM: 291.81  05/27/2017 - Present Unknown              Plan:    ?? Depression: needs inpatient psyche but not on involuntary commitment    ?? ETOH use: on librium, prn IV ativan, currently in ongoing withdrawals  ?? Thrombocytopenia: apprecaite heme workup, daily CBC, transfuse < 10K or with bleeding  ?? HEPC cirrhosis: noted, will need followup with GI  ?? Hypokalemia/hypomagnesemia: replace and followup  ?? Seizures: continue dilantin and topamax    DC planning/Dispo:  pending   Diet:  DIET REGULAR  DVT ppx:  SCD    Signed:  Orion Modest, MD

## 2017-05-31 NOTE — Progress Notes (Signed)
Ativan 1 mg slow IV given for agitation. ETOH scale 16.

## 2017-05-31 NOTE — Progress Notes (Signed)
Ativan 1 mg IV slow for agitation.

## 2017-05-31 NOTE — Progress Notes (Signed)
Pt resting in bed, confused, cooperative with care, breathing even and unlabored, safety measures in place, call light within reach.

## 2017-05-31 NOTE — Progress Notes (Signed)
Shift report received from previous RN. No needs voiced by patient.

## 2017-05-31 NOTE — Progress Notes (Signed)
Report given to oncoming nurse, patient confused , follows commands

## 2017-06-01 LAB — CBC WITH AUTOMATED DIFF
ABS. BASOPHILS: 0 10*3/uL (ref 0.0–0.2)
ABS. EOSINOPHILS: 0.2 10*3/uL (ref 0.0–0.8)
ABS. IMM. GRANS.: 0 10*3/uL (ref 0.0–0.5)
ABS. LYMPHOCYTES: 1.2 10*3/uL (ref 0.5–4.6)
ABS. MONOCYTES: 1.1 10*3/uL (ref 0.1–1.3)
ABS. NEUTROPHILS: 4.5 10*3/uL (ref 1.7–8.2)
ABSOLUTE NRBC: 0 10*3/uL (ref 0.0–0.2)
BASOPHILS: 1 % (ref 0.0–2.0)
EOSINOPHILS: 2 % (ref 0.5–7.8)
HCT: 38.4 % (ref 35.8–46.3)
HGB: 12.8 g/dL (ref 11.7–15.4)
IMMATURE GRANULOCYTES: 1 % (ref 0.0–5.0)
LYMPHOCYTES: 17 % (ref 13–44)
MCH: 32.3 PG (ref 26.1–32.9)
MCHC: 33.3 g/dL (ref 31.4–35.0)
MCV: 97 FL (ref 79.6–97.8)
MONOCYTES: 16 % — ABNORMAL HIGH (ref 4.0–12.0)
MPV: 11.4 FL (ref 9.4–12.3)
NEUTROPHILS: 65 % (ref 43–78)
PLATELET: 27 10*3/uL — CL (ref 150–450)
RBC: 3.96 M/uL — ABNORMAL LOW (ref 4.05–5.2)
RDW: 17.5 %
WBC: 7 10*3/uL (ref 4.3–11.1)

## 2017-06-01 LAB — METABOLIC PANEL, COMPREHENSIVE
A-G Ratio: 0.5 — ABNORMAL LOW (ref 1.2–3.5)
ALT (SGPT): 21 U/L (ref 12–65)
AST (SGOT): 58 U/L — ABNORMAL HIGH (ref 15–37)
Albumin: 2.7 g/dL — ABNORMAL LOW (ref 3.5–5.0)
Alk. phosphatase: 198 U/L — ABNORMAL HIGH (ref 50–136)
Anion gap: 11 mmol/L (ref 7–16)
BUN: 5 MG/DL — ABNORMAL LOW (ref 6–23)
Bilirubin, total: 2.3 MG/DL — ABNORMAL HIGH (ref 0.2–1.1)
CO2: 20 mmol/L — ABNORMAL LOW (ref 21–32)
Calcium: 8.3 MG/DL (ref 8.3–10.4)
Chloride: 108 mmol/L — ABNORMAL HIGH (ref 98–107)
Creatinine: 0.59 MG/DL — ABNORMAL LOW (ref 0.6–1.0)
GFR est AA: >60 mL/min/{1.73_m2} (ref >60)
GFR est non-AA: >60 mL/min/{1.73_m2} (ref >60)
Globulin: 5.3 g/dL — ABNORMAL HIGH (ref 2.3–3.5)
Glucose: 98 mg/dL (ref 65–100)
Potassium: 3.6 mmol/L (ref 3.5–5.1)
Protein, total: 8 g/dL (ref 6.3–8.2)
Sodium: 139 mmol/L (ref 136–145)

## 2017-06-01 LAB — URINALYSIS W/ RFLX MICROSCOPIC
Glucose: NEGATIVE mg/dL
Ketone: 15 mg/dL — AB
Nitrites: POSITIVE — AB
Protein: 30 mg/dL — AB
Specific gravity: 1.03 — ABNORMAL HIGH (ref 1.001–1.023)
Urobilinogen: 4 EU/dL — ABNORMAL HIGH (ref 0.2–1.0)
WBC: >100 /hpf
pH (UA): 5 (ref 5.0–9.0)

## 2017-06-01 LAB — MAGNESIUM: Magnesium: 1.7 mg/dL — ABNORMAL LOW (ref 1.8–2.4)

## 2017-06-01 MED ORDER — LORAZEPAM 1 MG TAB
1 mg | ORAL | Status: DC | PRN
Start: 2017-06-01 — End: 2017-06-06
  Administered 2017-06-02 – 2017-06-06 (×11): via ORAL

## 2017-06-01 MED ORDER — SODIUM CHLORIDE 0.9 % IV
INTRAVENOUS | Status: DC
Start: 2017-06-01 — End: 2017-06-06
  Administered 2017-06-01 – 2017-06-04 (×4): via INTRAVENOUS

## 2017-06-01 MED ORDER — MAGNESIUM SULFATE 2 GRAM/50 ML IVPB
2 gram/50 mL (4 %) | Freq: Once | INTRAVENOUS | Status: AC
Start: 2017-06-01 — End: 2017-06-01
  Administered 2017-06-01: 18:00:00 via INTRAVENOUS

## 2017-06-01 MED ORDER — CHLORDIAZEPOXIDE 25 MG CAP
25 mg | Freq: Two times a day (BID) | ORAL | Status: DC
Start: 2017-06-01 — End: 2017-06-03
  Administered 2017-06-02 – 2017-06-03 (×4): via ORAL

## 2017-06-01 MED FILL — BUSPIRONE 5 MG TAB: 5 mg | ORAL | Qty: 3

## 2017-06-01 MED FILL — CHLORDIAZEPOXIDE 25 MG CAP: 25 mg | ORAL | Qty: 1

## 2017-06-01 MED FILL — LORAZEPAM 2 MG/ML IJ SOLN: 2 mg/mL | INTRAMUSCULAR | Qty: 1

## 2017-06-01 MED FILL — THIAMINE HCL 100 MG TAB: 100 mg | ORAL | Qty: 1

## 2017-06-01 MED FILL — SODIUM CHLORIDE 0.9 % IV: INTRAVENOUS | Qty: 1000

## 2017-06-01 MED FILL — STRESS FORMULA WITH ZINC TABLET: ORAL | Qty: 1

## 2017-06-01 MED FILL — MAGNESIUM SULFATE 2 GRAM/50 ML IVPB: 2 gram/50 mL (4 %) | INTRAVENOUS | Qty: 50

## 2017-06-01 MED FILL — PHENYTOIN SODIUM EXTENDED 100 MG CAP: 100 mg | ORAL | Qty: 3

## 2017-06-01 MED FILL — TOPIRAMATE 25 MG TAB: 25 mg | ORAL | Qty: 1

## 2017-06-01 MED FILL — SERTRALINE 100 MG TAB: 100 mg | ORAL | Qty: 1

## 2017-06-01 MED FILL — FOLIC ACID 1 MG TAB: 1 mg | ORAL | Qty: 1

## 2017-06-01 NOTE — Progress Notes (Signed)
Hospitalist Progress Note     Admit Date:  05/26/2017  4:04 PM   Name:  Courtney Garcia   Age:  46 y.o.  DOB:  October 29, 1970   MRN:  [id number]   PCP:  None  Treatment Team: Attending Provider: Angela Adam, MD; Care Manager: Kenton Kingfisher. Lorel Monaco    Subjective:       Ms. Brubacher is a 46 yo female with PMH of ETOH use, HEPC with cirrhosis followed at [place], [place], seizures admitted with ETOH intoxication, suicidal ideation and depression. She has had a lot of recent social stressors. She has been seen by telepsyche and recommends inpatient psyche care but not IVC. She has been managed with ETOH withdrawal protocol. She has profound thrombocytopenia, seen by hematology. Goal to transfuse platelets if <10K / or bleeding. ABD US showed cirrhosis and splenomegaly. CTAP showed hepatomegaly/ portal hypertension. Thrombocytopenia is likely due to ETOH/ liver disease/ needs 3 week hematology followup.      9-23 -18 confused, very sleepy , drinking fluids ok, not much appetite, has dysuria    Objective:     Patient Vitals for the past 24 hrs:   Temp Pulse Resp BP SpO2   06/01/17 1100 98.8 ??F (37.1 ??C) 93 18 106/74 94 %   06/01/17 0700 98.2 ??F (36.8 ??C) (!) 116 18 94/71 95 %   06/01/17 0333 98.4 ??F (36.9 ??C) (!) 105 17 123/75 94 %   05/31/17 2333 99.5 ??F (37.5 ??C) (!) 108 18 105/73 92 %   05/31/17 1928 97.6 ??F (36.4 ??C) 97 18 105/72 96 %     Oxygen Therapy  O2 Sat (%): 94 % (06/01/17 1100)  Pulse via Oximetry: 97 beats per minute (05/26/17 1553)  O2 Device: Room air (05/31/17 0710)    Intake/Output Summary (Last 24 hours) at 06/01/17 1510  Last data filed at 06/01/17 1249   Gross per 24 hour   Intake              370 ml   Output                0 ml   Net              370 ml         General:    Well nourished..  No distress , confused , sleepy  CV:   RRR.  No murmur, rub, or gallop. No edema  Lungs:   CTAB.  No wheezing, rhonchi, or rales. Anterior exam   Abdomen:   Soft, nontender, nondistended. Decreased BS   Extremities: Warm and dry.    Skin:     No rashes or jaundice.   Neuro:  Confused , sleepy    Data Review:  I have reviewed all labs, meds, telemetry events, and studies from the last 24 hours:    Recent Results (from the past 24 hour(s))   CBC WITH AUTOMATED DIFF    Collection Time: 06/01/17  5:02 AM   Result Value Ref Range    WBC 7.0 4.3 - 11.1 K/uL    RBC 3.96 (L) 4.05 - 5.2 M/uL    HGB 12.8 11.7 - 15.4 g/dL    HCT 38.4 35.8 - 46.3 %    MCV 97.0 79.6 - 97.8 FL    MCH 32.3 26.1 - 32.9 PG    MCHC 33.3 31.4 - 35.0 g/dL    RDW 17.5 %    PLATELET 27 (LL) 150 - 450 K/uL  MPV 11.4 9.4 - 12.3 FL    ABSOLUTE NRBC 0.00 0.0 - 0.2 K/uL    DF AUTOMATED      NEUTROPHILS 65 43 - 78 %    LYMPHOCYTES 17 13 - 44 %    MONOCYTES 16 (H) 4.0 - 12.0 %    EOSINOPHILS 2 0.5 - 7.8 %    BASOPHILS 1 0.0 - 2.0 %    IMMATURE GRANULOCYTES 1 0.0 - 5.0 %    ABS. NEUTROPHILS 4.5 1.7 - 8.2 K/UL    ABS. LYMPHOCYTES 1.2 0.5 - 4.6 K/UL    ABS. MONOCYTES 1.1 0.1 - 1.3 K/UL    ABS. EOSINOPHILS 0.2 0.0 - 0.8 K/UL    ABS. BASOPHILS 0.0 0.0 - 0.2 K/UL    ABS. IMM. GRANS. 0.0 0.0 - 0.5 K/UL   METABOLIC PANEL, COMPREHENSIVE    Collection Time: 06/01/17  5:02 AM   Result Value Ref Range    Sodium 139 136 - 145 mmol/L    Potassium 3.6 3.5 - 5.1 mmol/L    Chloride 108 (H) 98 - 107 mmol/L    CO2 20 (L) 21 - 32 mmol/L    Anion gap 11 7 - 16 mmol/L    Glucose 98 65 - 100 mg/dL    BUN 5 (L) 6 - 23 MG/DL    Creatinine 0.59 (L) 0.6 - 1.0 MG/DL    GFR est AA >60 >60 ml/min/1.52m    GFR est non-AA >60 >60 ml/min/1.727m   Calcium 8.3 8.3 - 10.4 MG/DL    Bilirubin, total 2.3 (H) 0.2 - 1.1 MG/DL    ALT (SGPT) 21 12 - 65 U/L    AST (SGOT) 58 (H) 15 - 37 U/L    Alk. phosphatase 198 (H) 50 - 136 U/L    Protein, total 8.0 6.3 - 8.2 g/dL    Albumin 2.7 (L) 3.5 - 5.0 g/dL    Globulin 5.3 (H) 2.3 - 3.5 g/dL    A-G Ratio 0.5 (L) 1.2 - 3.5     MAGNESIUM    Collection Time: 06/01/17  5:02 AM   Result Value Ref Range    Magnesium 1.7 (L) 1.8 - 2.4 mg/dL         All Micro Results     None          No results found for this visit on 05/26/17.    Current Meds:  Current Facility-Administered Medications   Medication Dose Route Frequency   ??? LORazepam (ATIVAN) tablet 1 mg  1 mg Oral Q4H PRN   ??? magnesium sulfate 2 g/50 ml IVPB (premix or compounded)  2 g IntraVENous ONCE   ??? 0.9% sodium chloride infusion  75 mL/hr IntraVENous CONTINUOUS   ??? chlordiazePOXIDE (LIBRIUM) capsule 25 mg  25 mg Oral BID   ??? LORazepam (ATIVAN) injection 1 mg  1 mg IntraVENous Q4H PRN   ??? busPIRone (BUSPAR) tablet 15 mg  15 mg Oral TID   ??? sertraline (ZOLOFT) tablet 100 mg  100 mg Oral DAILY   ??? 0.9% sodium chloride infusion 250 mL  250 mL IntraVENous PRN   ??? thiamine HCL (B-1) tablet 100 mg  100 mg Oral DAILY   ??? multivitamin w ZN (STRESSTABS W ZINC) tablet  1 Tab Oral DAILY   ??? sodium chloride (NS) flush 5-10 mL  5-10 mL IntraVENous Q8H   ??? sodium chloride (NS) flush 5-10 mL  5-10 mL IntraVENous PRN   ??? folic acid (FOLVITE) tablet 1  mg  1 mg Oral DAILY   ??? phenytoin ER (DILANTIN ER) ER capsule 300 mg  300 mg Oral QHS   ??? topiramate (TOPAMAX) tablet 25 mg  25 mg Oral BID WITH MEALS   ??? influenza vaccine 2018-19 (6 mos+)(PF) (FLUARIX QUAD/FLULAVAL QUAD) injection 0.5 mL  0.5 mL IntraMUSCular PRIOR TO DISCHARGE       Other Studies (last 24 hours):  No results found.    Assessment and Plan:     Hospital Problems as of 06/01/2017  Never Reviewed          Codes Class Noted - Resolved POA    Alcohol abuse ICD-10-CM: F10.10  ICD-9-CM: 305.00  05/27/2017 - Present Yes        Seizure disorder ([place]) ICD-10-CM: G40.909  ICD-9-CM: 345.90  05/27/2017 - Present Yes        Cirrhosis ([place]) ICD-10-CM: K74.60  ICD-9-CM: 571.5  05/27/2017 - Present Unknown        Hep C w/o coma, chronic ([place]) ICD-10-CM: B18.2  ICD-9-CM: 070.54  05/27/2017 - Present Unknown        Thrombocytopenia ([place]) ICD-10-CM: D69.6  ICD-9-CM: 287.5  05/27/2017 - Present Unknown        * (Principal)Alcohol withdrawal ([place]) ICD-10-CM: J82.505   ICD-9-CM: 291.81  05/27/2017 - Present Unknown              Plan:    ?? Depression: needs inpatient psyche but not on involuntary commitment   ?? ETOH use: wean  librium, prn  ativan, currently in ongoing withdrawals  ?? Thrombocytopenia: apprecaite heme workup, daily CBC, transfuse < 10K or with bleeding  ?? HEPC cirrhosis: noted, will need followup with GI  ?? Hypokalemia/hypomagnesemia: replace and followup  ?? Seizures: continue dilantin and topamax  ?? Dysuria: check UA    DC planning/Dispo:  pending   Diet:  DIET REGULAR  DVT ppx:  SCD    Signed:  Orion Modest, MD

## 2017-06-01 NOTE — Progress Notes (Signed)
Ativan 1 mg given for agitation.

## 2017-06-01 NOTE — Progress Notes (Addendum)
Report given to oncoming nurse, patient confused.

## 2017-06-01 NOTE — Progress Notes (Signed)
Bedside report received from night nurse Ernesto. Assessment done as noted  Respiration even and unlabored 20/min; denies pain or nausea at present. Encouraged to call with needs.

## 2017-06-01 NOTE — Progress Notes (Signed)
PM assessment done. No changes noted. Respiration even and unlabored 20/min at rest. No s/s of pain noted at present. Sleeping at long interval. Took PO meds this afternoon. Appetite poor. Door open. Will monitor closely

## 2017-06-01 NOTE — Progress Notes (Signed)
[facility] shift report received from [place], Charity fundraiserN. Pt stable. Assessment complete. Pt is lying in bed. Resp even, unlabored. Pt is alert, orient X 3, with periodic confusion. Pt appears in no acute distress at this time. Pt is on RA. Pt encouraged to call for assistance, call light in reach. Safety measures in place. Will continue to monitor

## 2017-06-01 NOTE — Progress Notes (Signed)
IV positional. Unsuccessfull attempts X3 to start new IV. IVF held for now until new IV access is placed.  NAD noted. To report off to on coming nurse.

## 2017-06-01 NOTE — Progress Notes (Signed)
Problem: Falls - Risk of  Goal: *Absence of Falls  Document Schmid Fall Risk and appropriate interventions in the flowsheet.   Outcome: Progressing Towards Goal  Fall Risk Interventions:       Mentation Interventions: Bed/chair exit alarm    Medication Interventions: Bed/chair exit alarm         History of Falls Interventions: Bed/chair exit alarm

## 2017-06-02 DIAGNOSIS — N390 Urinary tract infection, site not specified: Secondary | ICD-10-CM | POA: Insufficient documentation

## 2017-06-02 HISTORY — DX: Urinary tract infection, site not specified: N39.0

## 2017-06-02 LAB — CBC WITH AUTOMATED DIFF
ABS. BASOPHILS: 0 10*3/uL (ref 0.0–0.2)
ABS. EOSINOPHILS: 0.1 10*3/uL (ref 0.0–0.8)
ABS. IMM. GRANS.: 0 10*3/uL (ref 0.0–0.5)
ABS. LYMPHOCYTES: 1.1 10*3/uL (ref 0.5–4.6)
ABS. MONOCYTES: 1 10*3/uL (ref 0.1–1.3)
ABS. NEUTROPHILS: 3.6 10*3/uL (ref 1.7–8.2)
ABSOLUTE NRBC: 0 10*3/uL (ref 0.0–0.2)
BASOPHILS: 1 % (ref 0.0–2.0)
EOSINOPHILS: 2 % (ref 0.5–7.8)
HCT: 42.4 % (ref 35.8–46.3)
HGB: 13.7 g/dL (ref 11.7–15.4)
IMMATURE GRANULOCYTES: 0 % (ref 0.0–5.0)
LYMPHOCYTES: 19 % (ref 13–44)
MCH: 32.5 PG (ref 26.1–32.9)
MCHC: 32.3 g/dL (ref 31.4–35.0)
MCV: 100.5 FL — ABNORMAL HIGH (ref 79.6–97.8)
MONOCYTES: 17 % — ABNORMAL HIGH (ref 4.0–12.0)
MPV: 11.6 FL (ref 9.4–12.3)
NEUTROPHILS: 61 % (ref 43–78)
PLATELET: 19 10*3/uL — CL (ref 150–450)
RBC: 4.22 M/uL (ref 4.05–5.2)
RDW: 18.4 %
WBC: 5.8 10*3/uL (ref 4.3–11.1)

## 2017-06-02 LAB — METABOLIC PANEL, COMPREHENSIVE
A-G Ratio: 0.4 — ABNORMAL LOW (ref 1.2–3.5)
ALT (SGPT): 22 U/L (ref 12–65)
AST (SGOT): 64 U/L — ABNORMAL HIGH (ref 15–37)
Albumin: 2.4 g/dL — ABNORMAL LOW (ref 3.5–5.0)
Alk. phosphatase: 193 U/L — ABNORMAL HIGH (ref 50–136)
Anion gap: 13 mmol/L (ref 7–16)
BUN: 6 MG/DL (ref 6–23)
Bilirubin, total: 2.5 MG/DL — ABNORMAL HIGH (ref 0.2–1.1)
CO2: 19 mmol/L — ABNORMAL LOW (ref 21–32)
Calcium: 8.3 MG/DL (ref 8.3–10.4)
Chloride: 107 mmol/L (ref 98–107)
Creatinine: 0.63 MG/DL (ref 0.6–1.0)
GFR est AA: >60 mL/min/{1.73_m2} (ref >60)
GFR est non-AA: >60 mL/min/{1.73_m2} (ref >60)
Globulin: 5.8 g/dL — ABNORMAL HIGH (ref 2.3–3.5)
Glucose: 84 mg/dL (ref 65–100)
Potassium: 4.2 mmol/L (ref 3.5–5.1)
Protein, total: 8.2 g/dL (ref 6.3–8.2)
Sodium: 139 mmol/L (ref 136–145)

## 2017-06-02 LAB — MAGNESIUM: Magnesium: 1.8 mg/dL (ref 1.8–2.4)

## 2017-06-02 MED ORDER — METRONIDAZOLE 500 MG TAB
500 mg | Freq: Two times a day (BID) | ORAL | Status: DC
Start: 2017-06-02 — End: 2017-06-06
  Administered 2017-06-03 – 2017-06-06 (×8): via ORAL

## 2017-06-02 MED ORDER — ADV ADDAPTOR
1 gram | Status: DC
Start: 2017-06-02 — End: 2017-06-06
  Administered 2017-06-02 – 2017-06-05 (×4): via INTRAVENOUS

## 2017-06-02 MED ORDER — FLUCONAZOLE 100 MG TAB
100 mg | ORAL | Status: AC
Start: 2017-06-02 — End: 2017-06-02
  Administered 2017-06-02: 22:00:00 via ORAL

## 2017-06-02 MED FILL — TOPIRAMATE 25 MG TAB: 25 mg | ORAL | Qty: 1

## 2017-06-02 MED FILL — STRESS FORMULA WITH ZINC TABLET: ORAL | Qty: 1

## 2017-06-02 MED FILL — BUSPIRONE 5 MG TAB: 5 mg | ORAL | Qty: 3

## 2017-06-02 MED FILL — FLUCONAZOLE 100 MG TAB: 100 mg | ORAL | Qty: 2

## 2017-06-02 MED FILL — CHLORDIAZEPOXIDE 25 MG CAP: 25 mg | ORAL | Qty: 1

## 2017-06-02 MED FILL — LORAZEPAM 1 MG TAB: 1 mg | ORAL | Qty: 1

## 2017-06-02 MED FILL — FOLIC ACID 1 MG TAB: 1 mg | ORAL | Qty: 1

## 2017-06-02 MED FILL — CEFTRIAXONE 1 GRAM SOLUTION FOR INJECTION: 1 gram | INTRAMUSCULAR | Qty: 1

## 2017-06-02 MED FILL — THIAMINE HCL 100 MG TAB: 100 mg | ORAL | Qty: 1

## 2017-06-02 MED FILL — SERTRALINE 100 MG TAB: 100 mg | ORAL | Qty: 1

## 2017-06-02 MED FILL — PHENYTOIN SODIUM EXTENDED 100 MG CAP: 100 mg | ORAL | Qty: 3

## 2017-06-02 NOTE — Progress Notes (Signed)
Hospitalist Progress Note     Admit Date:  05/26/2017  4:04 PM   Name:  Courtney Garcia   Age:  46 y.o.  DOB:  May 07, 1971   MRN:  [id number]   PCP:  None  Treatment Team: Attending Provider: Angela Adam, MD; Care Manager: Kenton Kingfisher. Lorel Monaco; Utilization Review: Romeo Apple, RN    Subjective:       Courtney Garcia is a 46 yo female with PMH of ETOH use, HEPC with cirrhosis followed at [place], [place], seizures admitted with ETOH intoxication, suicidal ideation and depression. She has had a lot of recent social stressors. She has been seen by telepsyche and recommends inpatient psyche care but not IVC. She has been managed with ETOH withdrawal protocol. She has profound thrombocytopenia, seen by hematology. Goal to transfuse platelets if <10K / or bleeding. ABD US showed cirrhosis and splenomegaly. CTAP showed hepatomegaly/ portal hypertension. Thrombocytopenia is likely due to ETOH/ liver disease/ needs 3 week hematology followup.  She has UTI, ucx pending, rocephin started 9-24, also with profuse vaginal discharge.  Plans for home once improved      9-24 -18 still intermittently agitated but A and O x 2, has vaginal discharge per CNA, ate little amounts,     Objective:     Patient Vitals for the past 24 hrs:   Temp Pulse Resp BP SpO2   06/02/17 1514 98.2 ??F (36.8 ??C) 86 18 106/73 95 %   06/02/17 1104 98.3 ??F (36.8 ??C) 93 19 112/70 94 %   06/02/17 0713 98.4 ??F (36.9 ??C) (!) 108 21 112/69 94 %   06/02/17 0311 98.5 ??F (36.9 ??C) 100 20 105/70 93 %   06/01/17 2229 98.2 ??F (36.8 ??C) 92 18 103/70 95 %   06/01/17 1946 97.4 ??F (36.3 ??C) 75 16 109/75 93 %     Oxygen Therapy  O2 Sat (%): 95 % (06/02/17 1514)  Pulse via Oximetry: 97 beats per minute (05/26/17 1553)  O2 Device: Room air (05/31/17 0710)    Intake/Output Summary (Last 24 hours) at 06/02/17 1659  Last data filed at 06/02/17 1051   Gross per 24 hour   Intake              640 ml   Output              550 ml   Net               90 ml          General:    Well nourished..  No distress ,trying to climb out of bed  CV:   RRR.  No murmur, rub, or gallop. No edema  Lungs:   CTAB.  No wheezing, rhonchi, or rales.  Abdomen:   Soft, nontender, nondistended. Decreased BS  Extremities: Warm and dry.    Skin:     No rashes or jaundice.   Neuro:  A and O x 2     Data Review:  I have reviewed all labs, meds, telemetry events, and studies from the last 24 hours:    Recent Results (from the past 24 hour(s))   URINALYSIS W/ RFLX MICROSCOPIC    Collection Time: 06/01/17  6:52 PM   Result Value Ref Range    Color RED      Appearance TURBID      Specific gravity 1.030 (H) 1.001 - 1.023      pH (UA) 5.0 5.0 - 9.0  Protein 30 (A) NEG mg/dL    Glucose NEGATIVE  mg/dL    Ketone 15 (A) NEG mg/dL    Bilirubin MODERATE (A) NEG      Blood SMALL (A) NEG      Urobilinogen 4.0 (H) 0.2 - 1.0 EU/dL    Nitrites POSITIVE (A) NEG      Leukocyte Esterase LARGE (A) NEG      WBC >100 0 /hpf    RBC 0-3 0 /hpf    Epithelial cells 0-3 0 /hpf    Bacteria 4+ (H) 0 /hpf    Amorphous Crystals 3+ (H) 0    Other observations RESULTS VERIFIED MANUALLY     MAGNESIUM    Collection Time: 06/02/17  6:14 AM   Result Value Ref Range    Magnesium 1.8 1.8 - 2.4 mg/dL   METABOLIC PANEL, COMPREHENSIVE    Collection Time: 06/02/17  6:14 AM   Result Value Ref Range    Sodium 139 136 - 145 mmol/L    Potassium 4.2 3.5 - 5.1 mmol/L    Chloride 107 98 - 107 mmol/L    CO2 19 (L) 21 - 32 mmol/L    Anion gap 13 7 - 16 mmol/L    Glucose 84 65 - 100 mg/dL    BUN 6 6 - 23 MG/DL    Creatinine 0.63 0.6 - 1.0 MG/DL    GFR est AA >60 >60 ml/min/1.26m    GFR est non-AA >60 >60 ml/min/1.763m   Calcium 8.3 8.3 - 10.4 MG/DL    Bilirubin, total 2.5 (H) 0.2 - 1.1 MG/DL    ALT (SGPT) 22 12 - 65 U/L    AST (SGOT) 64 (H) 15 - 37 U/L    Alk. phosphatase 193 (H) 50 - 136 U/L    Protein, total 8.2 6.3 - 8.2 g/dL    Albumin 2.4 (L) 3.5 - 5.0 g/dL    Globulin 5.8 (H) 2.3 - 3.5 g/dL    A-G Ratio 0.4 (L) 1.2 - 3.5      CBC WITH AUTOMATED DIFF    Collection Time: 06/02/17  6:14 AM   Result Value Ref Range    WBC 5.8 4.3 - 11.1 K/uL    RBC 4.22 4.05 - 5.2 M/uL    HGB 13.7 11.7 - 15.4 g/dL    HCT 42.4 35.8 - 46.3 %    MCV 100.5 (H) 79.6 - 97.8 FL    MCH 32.5 26.1 - 32.9 PG    MCHC 32.3 31.4 - 35.0 g/dL    RDW 18.4 %    PLATELET 19 (LL) 150 - 450 K/uL    MPV 11.6 9.4 - 12.3 FL    ABSOLUTE NRBC 0.00 0.0 - 0.2 K/uL    DF AUTOMATED      NEUTROPHILS 61 43 - 78 %    LYMPHOCYTES 19 13 - 44 %    MONOCYTES 17 (H) 4.0 - 12.0 %    EOSINOPHILS 2 0.5 - 7.8 %    BASOPHILS 1 0.0 - 2.0 %    IMMATURE GRANULOCYTES 0 0.0 - 5.0 %    ABS. NEUTROPHILS 3.6 1.7 - 8.2 K/UL    ABS. LYMPHOCYTES 1.1 0.5 - 4.6 K/UL    ABS. MONOCYTES 1.0 0.1 - 1.3 K/UL    ABS. EOSINOPHILS 0.1 0.0 - 0.8 K/UL    ABS. BASOPHILS 0.0 0.0 - 0.2 K/UL    ABS. IMM. GRANS. 0.0 0.0 - 0.5 K/UL  All Micro Results     Procedure Component Value Units Date/Time    CULTURE, URINE [[id number]] Collected:  06/02/17 0900    Order Status:  Completed Specimen:  Urine from Clean catch Updated:  06/02/17 1108          No results found for this visit on 05/26/17.    Current Meds:  Current Facility-Administered Medications   Medication Dose Route Frequency   ??? cefTRIAXone (ROCEPHIN) 1 g in 0.9% sodium chloride (MBP/ADV) 50 mL  1 g IntraVENous Q24H   ??? fluconazole (DIFLUCAN) tablet 150 mg  150 mg Oral NOW   ??? metroNIDAZOLE (FLAGYL) tablet 500 mg  500 mg Oral Q12H   ??? LORazepam (ATIVAN) tablet 1 mg  1 mg Oral Q4H PRN   ??? 0.9% sodium chloride infusion  75 mL/hr IntraVENous CONTINUOUS   ??? chlordiazePOXIDE (LIBRIUM) capsule 25 mg  25 mg Oral BID   ??? LORazepam (ATIVAN) injection 1 mg  1 mg IntraVENous Q4H PRN   ??? busPIRone (BUSPAR) tablet 15 mg  15 mg Oral TID   ??? sertraline (ZOLOFT) tablet 100 mg  100 mg Oral DAILY   ??? 0.9% sodium chloride infusion 250 mL  250 mL IntraVENous PRN   ??? thiamine HCL (B-1) tablet 100 mg  100 mg Oral DAILY   ??? multivitamin w ZN (STRESSTABS W ZINC) tablet  1 Tab Oral DAILY    ??? sodium chloride (NS) flush 5-10 mL  5-10 mL IntraVENous Q8H   ??? sodium chloride (NS) flush 5-10 mL  5-10 mL IntraVENous PRN   ??? folic acid (FOLVITE) tablet 1 mg  1 mg Oral DAILY   ??? phenytoin ER (DILANTIN ER) ER capsule 300 mg  300 mg Oral QHS   ??? topiramate (TOPAMAX) tablet 25 mg  25 mg Oral BID WITH MEALS   ??? influenza vaccine 2018-19 (6 mos+)(PF) (FLUARIX QUAD/FLULAVAL QUAD) injection 0.5 mL  0.5 mL IntraMUSCular PRIOR TO DISCHARGE       Other Studies (last 24 hours):  No results found.    Assessment and Plan:     Hospital Problems as of 06/02/2017  Never Reviewed          Codes Class Noted - Resolved POA    UTI (urinary tract infection) ICD-10-CM: N39.0  ICD-9-CM: 599.0  06/02/2017 - Present No        Alcohol abuse ICD-10-CM: F10.10  ICD-9-CM: 305.00  05/27/2017 - Present Yes        Seizure disorder ([place]) ICD-10-CM: G40.909  ICD-9-CM: 345.90  05/27/2017 - Present Yes        Cirrhosis ([place]) ICD-10-CM: K74.60  ICD-9-CM: 571.5  05/27/2017 - Present Unknown        Hep C w/o coma, chronic ([place]) ICD-10-CM: B18.2  ICD-9-CM: 070.54  05/27/2017 - Present Unknown        Thrombocytopenia ([place]) ICD-10-CM: D69.6  ICD-9-CM: 287.5  05/27/2017 - Present Unknown        * (Principal)Alcohol withdrawal ([place]) ICD-10-CM: P95.093  ICD-9-CM: 291.81  05/27/2017 - Present Unknown              Plan:    ?? Depression: needs inpatient psyche but not on involuntary commitment   ?? ETOH use: weaning librium, continue prn  ativan, currently in ongoing withdrawals, on oral folate and thiamine   ?? Thrombocytopenia: apprecaite heme workup, daily CBC, transfuse < 10K or with bleeding  ?? HEPC cirrhosis: noted, will need followup with GI  ?? Hypokalemia/hypomagnesemia: replace and followup  ?? Seizures:  continue dilantin and topamax  ?? UTI: start rocephin, followup urine cx  ?? Vaginal discharge: diflucan once, 7 days of oral flagyl     DC planning/Dispo:  pending   Diet:  DIET REGULAR  DVT ppx:  SCD    Signed:  Orion Modest, MD

## 2017-06-02 NOTE — Progress Notes (Signed)
Pt rested off and on throughout shift. Pt alert, with periodic confusion this AM. SBAR end of shift report given to oncoming RN.

## 2017-06-02 NOTE — Progress Notes (Signed)
Bedside [facility] Assessment done as noted  Respiration even and unlabored 20/min; denies pain or nausea at present. Confused and hallucinating. Asking for beer and vodka. Door open. Encouraged to call with needs.

## 2017-06-02 NOTE — Progress Notes (Signed)
Patient was calm  Receptive to chaplain presence  Talked about the death of her boyfriend on 31-May-2023 Said he died in her presence  [place] Spaniel she has had dreams about "death" recently.   Not her own.  She talked about her long life of drinking.  35 years.  Said she quit 1 year.  Prays to God for help.  Appears hopeless in her conversation and her goals.  Hopefully chaplain can continue to be non-judgmental presence.  Will continue to maximize our time with her to hopefully give direction and guidance when discharged.  Prayer.      Farrel Gobble, staff chaplain, MDiv, [facility]  [phone]  /   [email or web address]

## 2017-06-02 NOTE — Other (Deleted)
Please clarify if this patient is being treated/managed for:    HEPATIC ENCEPHALOPATHY  In the setting of, history of alcohol abuse and being treated with ativan and librium    =>Other Explanation of clinical findings  =>Unable to Determine (no explanation of clinical findings)    The medical record reflects the following:    Risk Factors: History of Cirrhosis, Hx of heavy alcohol abuse, 45 yr female    Clinical Indicators:  Confusion, agitation/restlessness     Treatment:  Ativan, librium, ETOH withdrawal protocol ordered,     Please clarify and document your clinical opinion in the progress notes and discharge summary including the definitive and/or presumptive diagnosis, (suspected or probable), related to the above clinical findings. Please include clinical findings supporting your diagnosis.    Thanks,  Wyman Songster, RN  [employer] Management [facility]  [phone]

## 2017-06-02 NOTE — Progress Notes (Signed)
Resting quietly at present. Guest at bedside encouraging to eat supper. NAD noted. To report off to on coming nurse.

## 2017-06-02 NOTE — Progress Notes (Signed)
Spoke with MD Dairl Ponder([place]) about pt having no IV access. MD stated okay to hold fluids until day shift MD evaluates in morning. Will pass on to oncoming RN.

## 2017-06-02 NOTE — Progress Notes (Signed)
SBAR shift report received from [place], Charity fundraiserN. Pt stable. Assessment complete. Pt is lying in bed. Resp even, unlabored. Pt is alert, orient X 3. Pt appears in no acute distress at this time. Pt is on RA. Pt denies pain or SOB. Pt encouraged to call for assistance, call light in reach. Safety measures in place. Will continue to monitor

## 2017-06-02 NOTE — Progress Notes (Signed)
PM assessment done. No changes noted. Respiration even and unlabored 20/min at rest. No s/s of pain noted at present. Encouraged to call with needs.

## 2017-06-02 NOTE — Progress Notes (Signed)
Problem: Interdisciplinary Rounds  Goal: Interdisciplinary Rounds  Interdisciplinary team rounds were held 06/02/2017 with the following team members:Care Management, Physical Therapy, Physician and Clinical Coordinator and the patient.  Plan of care discussed. See clinical pathway and/or care plan for interventions and desired outcomes.

## 2017-06-02 NOTE — Progress Notes (Signed)
Pt given Ativan PO to help relax.

## 2017-06-02 NOTE — Progress Notes (Signed)
20ga iv placed in right forearm using ultrasound guidance.

## 2017-06-02 NOTE — Progress Notes (Signed)
Pt given Ativan to help relax. Will continue to monitor.

## 2017-06-03 ENCOUNTER — Inpatient Hospital Stay: Payer: Self-pay

## 2017-06-03 DIAGNOSIS — B9689 Other specified bacterial agents as the cause of diseases classified elsewhere: Secondary | ICD-10-CM | POA: Insufficient documentation

## 2017-06-03 DIAGNOSIS — N760 Acute vaginitis: Secondary | ICD-10-CM | POA: Insufficient documentation

## 2017-06-03 LAB — CBC WITH AUTOMATED DIFF
ABS. BASOPHILS: 0 10*3/uL (ref 0.0–0.2)
ABS. EOSINOPHILS: 0.1 10*3/uL (ref 0.0–0.8)
ABS. IMM. GRANS.: 0 10*3/uL (ref 0.0–0.5)
ABS. LYMPHOCYTES: 0.9 10*3/uL (ref 0.5–4.6)
ABS. MONOCYTES: 0.7 10*3/uL (ref 0.1–1.3)
ABS. NEUTROPHILS: 2.2 10*3/uL (ref 1.7–8.2)
ABSOLUTE NRBC: 0 10*3/uL (ref 0.0–0.2)
BASOPHILS: 1 % (ref 0.0–2.0)
EOSINOPHILS: 3 % (ref 0.5–7.8)
HCT: 37.9 % (ref 35.8–46.3)
HGB: 12.4 g/dL (ref 11.7–15.4)
IMMATURE GRANULOCYTES: 0 % (ref 0.0–5.0)
LYMPHOCYTES: 23 % (ref 13–44)
MCH: 32.3 PG (ref 26.1–32.9)
MCHC: 32.7 g/dL (ref 31.4–35.0)
MCV: 98.7 FL — ABNORMAL HIGH (ref 79.6–97.8)
MONOCYTES: 17 % — ABNORMAL HIGH (ref 4.0–12.0)
MPV: 11.3 FL (ref 9.4–12.3)
NEUTROPHILS: 56 % (ref 43–78)
PLATELET: 17 10*3/uL — CL (ref 150–450)
RBC: 3.84 M/uL — ABNORMAL LOW (ref 4.05–5.2)
RDW: 18.2 %
WBC: 4 10*3/uL — ABNORMAL LOW (ref 4.3–11.1)

## 2017-06-03 LAB — METABOLIC PANEL, COMPREHENSIVE
A-G Ratio: 0.5 — ABNORMAL LOW (ref 1.2–3.5)
ALT (SGPT): 13 U/L (ref 12–65)
AST (SGOT): 39 U/L — ABNORMAL HIGH (ref 15–37)
Albumin: 2.2 g/dL — ABNORMAL LOW (ref 3.5–5.0)
Alk. phosphatase: 164 U/L — ABNORMAL HIGH (ref 50–136)
Anion gap: 9 mmol/L (ref 7–16)
BUN: 4 MG/DL — ABNORMAL LOW (ref 6–23)
Bilirubin, total: 1.9 MG/DL — ABNORMAL HIGH (ref 0.2–1.1)
CO2: 21 mmol/L (ref 21–32)
Calcium: 8.3 MG/DL (ref 8.3–10.4)
Chloride: 111 mmol/L — ABNORMAL HIGH (ref 98–107)
Creatinine: 0.47 MG/DL — ABNORMAL LOW (ref 0.6–1.0)
GFR est AA: >60 mL/min/{1.73_m2} (ref >60)
GFR est non-AA: >60 mL/min/{1.73_m2} (ref >60)
Globulin: 4.8 g/dL — ABNORMAL HIGH (ref 2.3–3.5)
Glucose: 89 mg/dL (ref 65–100)
Potassium: 3.5 mmol/L (ref 3.5–5.1)
Protein, total: 7 g/dL (ref 6.3–8.2)
Sodium: 141 mmol/L (ref 136–145)

## 2017-06-03 LAB — MAGNESIUM: Magnesium: 1.7 mg/dL — ABNORMAL LOW (ref 1.8–2.4)

## 2017-06-03 MED ORDER — CHLORDIAZEPOXIDE 10 MG CAP
10 mg | Freq: Two times a day (BID) | ORAL | Status: DC
Start: 2017-06-03 — End: 2017-06-04
  Administered 2017-06-04 (×2): via ORAL

## 2017-06-03 MED FILL — CHLORDIAZEPOXIDE 25 MG CAP: 25 mg | ORAL | Qty: 1

## 2017-06-03 MED FILL — LORAZEPAM 1 MG TAB: 1 mg | ORAL | Qty: 1

## 2017-06-03 MED FILL — SERTRALINE 100 MG TAB: 100 mg | ORAL | Qty: 1

## 2017-06-03 MED FILL — BUSPIRONE 5 MG TAB: 5 mg | ORAL | Qty: 3

## 2017-06-03 MED FILL — METRONIDAZOLE 500 MG TAB: 500 mg | ORAL | Qty: 1

## 2017-06-03 MED FILL — TOPIRAMATE 25 MG TAB: 25 mg | ORAL | Qty: 1

## 2017-06-03 MED FILL — STRESS FORMULA WITH ZINC TABLET: ORAL | Qty: 1

## 2017-06-03 MED FILL — CEFTRIAXONE 1 GRAM SOLUTION FOR INJECTION: 1 gram | INTRAMUSCULAR | Qty: 1

## 2017-06-03 MED FILL — FOLIC ACID 1 MG TAB: 1 mg | ORAL | Qty: 1

## 2017-06-03 MED FILL — THIAMINE HCL 100 MG TAB: 100 mg | ORAL | Qty: 1

## 2017-06-03 MED FILL — PHENYTOIN SODIUM EXTENDED 100 MG CAP: 100 mg | ORAL | Qty: 3

## 2017-06-03 NOTE — Progress Notes (Signed)
Pt rested off and on throughout the shift with no issues. Pt in bed. SBAR end of shift report given to oncoming RN.

## 2017-06-03 NOTE — Progress Notes (Signed)
Called to room by CNA.  Patient hallucinating.  Talking to people not in the room.  Crying uncontrollable   Given routine bedtime medication.  Transport here to take patient for ultrasound.  Patient unable to go at this time due to emotional state.

## 2017-06-03 NOTE — Progress Notes (Signed)
Hospitalist Progress Note    Patient: Courtney Garcia MRN: [id number]  SSN: [id number]    Date of Birth: 12-Aug-1971  Age: 46 y.o.  Sex: female      Admit Date: 05/26/2017    LOS: 7 days     Subjective:     From previous notes: "46 yo female with PMH of ETOH use, HEPC with cirrhosis followed at [place], [place], seizures admitted with ETOH intoxication, suicidal ideation and depression. She has had a lot of recent social stressors. She has been seen by telepsyche and recommends inpatient psyche care but not IVC. She has been managed with ETOH withdrawal protocol. She has profound thrombocytopenia, seen by hematology. Goal to transfuse platelets if <10K / or bleeding. ABD US showed cirrhosis and splenomegaly. CTAP showed hepatomegaly/ portal hypertension. Thrombocytopenia is likely due to ETOH/ liver disease/ needs 3 week hematology followup. She has UTI, ucx pending, rocephin started 9-24, also with profuse vaginal discharge. Plans for home once improved."    9/25 - She complains of bilateral flank pain like "when I had a kidney stone." Denies F/C/N/V. Denies CP/SOB.    Review of systems negative except stated above.    Objective:     Visit Vitals   ??? BP 106/71   ??? Pulse 92   ??? Temp 97.9 ??F (36.6 ??C)   ??? Resp 19   ??? Ht 5' (1.524 m)   ??? Wt 58.3 kg (128 lb 9.6 oz)   ??? SpO2 93%   ??? BMI 25.12 kg/m2      Oxygen Therapy  O2 Sat (%): 93 % (06/03/17 0708)  Pulse via Oximetry: 97 beats per minute (05/26/17 1553)  O2 Device: Room air (05/31/17 0710)      Intake and Output:   Intake/Output Summary (Last 24 hours) at 06/03/17 1100  Last data filed at 06/03/17 0354   Gross per 24 hour   Intake              240 ml   Output              700 ml   Net             -460 ml         Physical Exam:   GENERAL: alert, cooperative, no distress, appears stated age  EYE: conjunctivae/corneas clear. PERRL.  THROAT & NECK: normal and no erythema or exudates noted.   LUNG: clear to auscultation bilaterally   HEART: regular rate and rhythm, S1S2, no murmur, no JVD  ABDOMEN: soft, non-tender, non-distended. Bowel sounds normal.   EXTREMITIES:  No edema, 2+ pedal/radial pulses bilaterally  SKIN: no rash or abnormalities  NEUROLOGIC: A&Ox3. Cranial nerves 2-12 grossly intact.    Lab/Data Review:  Recent Results (from the past 24 hour(s))   MAGNESIUM    Collection Time: 06/03/17  5:59 AM   Result Value Ref Range    Magnesium 1.7 (L) 1.8 - 2.4 mg/dL   METABOLIC PANEL, COMPREHENSIVE    Collection Time: 06/03/17  5:59 AM   Result Value Ref Range    Sodium 141 136 - 145 mmol/L    Potassium 3.5 3.5 - 5.1 mmol/L    Chloride 111 (H) 98 - 107 mmol/L    CO2 21 21 - 32 mmol/L    Anion gap 9 7 - 16 mmol/L    Glucose 89 65 - 100 mg/dL    BUN 4 (L) 6 - 23 MG/DL    Creatinine 0.47 (L) 0.6 - 1.0  MG/DL    GFR est AA >60 >60 ml/min/1.74m    GFR est non-AA >60 >60 ml/min/1.753m   Calcium 8.3 8.3 - 10.4 MG/DL    Bilirubin, total 1.9 (H) 0.2 - 1.1 MG/DL    ALT (SGPT) 13 12 - 65 U/L    AST (SGOT) 39 (H) 15 - 37 U/L    Alk. phosphatase 164 (H) 50 - 136 U/L    Protein, total 7.0 6.3 - 8.2 g/dL    Albumin 2.2 (L) 3.5 - 5.0 g/dL    Globulin 4.8 (H) 2.3 - 3.5 g/dL    A-G Ratio 0.5 (L) 1.2 - 3.5     CBC WITH AUTOMATED DIFF    Collection Time: 06/03/17  5:59 AM   Result Value Ref Range    WBC 4.0 (L) 4.3 - 11.1 K/uL    RBC 3.84 (L) 4.05 - 5.2 M/uL    HGB 12.4 11.7 - 15.4 g/dL    HCT 37.9 35.8 - 46.3 %    MCV 98.7 (H) 79.6 - 97.8 FL    MCH 32.3 26.1 - 32.9 PG    MCHC 32.7 31.4 - 35.0 g/dL    RDW 18.2 %    PLATELET 17 (LL) 150 - 450 K/uL    MPV 11.3 9.4 - 12.3 FL    ABSOLUTE NRBC 0.00 0.0 - 0.2 K/uL    DF AUTOMATED      NEUTROPHILS 56 43 - 78 %    LYMPHOCYTES 23 13 - 44 %    MONOCYTES 17 (H) 4.0 - 12.0 %    EOSINOPHILS 3 0.5 - 7.8 %    BASOPHILS 1 0.0 - 2.0 %    IMMATURE GRANULOCYTES 0 0.0 - 5.0 %    ABS. NEUTROPHILS 2.2 1.7 - 8.2 K/UL    ABS. LYMPHOCYTES 0.9 0.5 - 4.6 K/UL    ABS. MONOCYTES 0.7 0.1 - 1.3 K/UL    ABS. EOSINOPHILS 0.1 0.0 - 0.8 K/UL     ABS. BASOPHILS 0.0 0.0 - 0.2 K/UL    ABS. IMM. GRANS. 0.0 0.0 - 0.5 K/UL       Imaging:  Ct Abd Pelv W Cont    Result Date: 05/29/2017  IMPRESSION: 1.  Marked steatosis, mild hepatic megaly. 2.  Retroperitoneal edema in the upper abdomen, probably due to vascular congestion associated with portal hypertension.  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  Inflammatory change associated with mild acute pancreatitis is not completely excluded.     Us[place]abd Comp    Result Date: 05/28/2017  Impression: Diffusely hyperechoic liver with borderline splenomegaly, consistent with clinical history of cirrhosis, without focal liver mass seen.    No results found for this visit on 05/26/17.    Cultures:  All Micro Results     Procedure Component Value Units Date/Time    CULTURE, URINE [[id number]]ollected:  06/02/17 0900    Order Status:  Completed Specimen:  Urine from Clean catch Updated:  06/03/17 0811     Special Requests: NO SPECIAL REQUESTS        Culture result:         <10,000 COLONIES/mL MIXED SKIN FLORA ISOLATED          Assessment/Plan:     Principal Problem:    Alcohol withdrawal ([place](05/27/2017)  - Slowly improving  - Wean Librium  - Ativan PRN    Active Problems:    UTI (urinary tract infection) (06/02/2017)  - UA grossly positive with bacteria and pyuria  -  Continue Ceftriaxone  - Await final urine C&S  - Check renal U/S      Acute metabolic encephalopathy (11/22/1759)  - Likely multifactorial due to UTI + Etoh withdrawal  - Not hepatic encephalopathy --> Ammonia 34  - Continue IV antibiotics  - Wean Librium      Bacterial Vaginosis (06/01/2017)  - Continue Flagyl      Cirrhosis ([place]) (05/27/2017)  - Due to Hep C +EtOH      Thrombocytopenia ([place]) (05/27/2017)  - Platelets down to 17K today  - Hematology follow and recs transfuse <10K      Hep C w/o coma, chronic ([place]) (05/27/2017)      Alcohol abuse (05/27/2017)      Seizure disorder ([place]) (05/27/2017)  - No acute seizures  - Continue Topamax  - Continue Dilantin   - Ativan PRN      Dispo - Wean Librium. Await final urine culture. Plan home in in next 1-2 days.    DIET REGULAR    DVT Prophylaxis: SCDs      Signed By: Faythe Casa, DO     June 03, 2017

## 2017-06-03 NOTE — Progress Notes (Signed)
Shift report received from previous RN. No needs voiced by patient.

## 2017-06-03 NOTE — Progress Notes (Signed)
Patient c/o back pain stating they need to check out her kidneys.  Starting to get agitated and crying.  Ativan 1 mg po for agitation

## 2017-06-03 NOTE — Progress Notes (Signed)
Respirations even and unlabored. No s/sx distress. No needs at present.

## 2017-06-03 NOTE — Progress Notes (Signed)
Resting quietly in bed at present.

## 2017-06-03 NOTE — Progress Notes (Signed)
Problem: Interdisciplinary Rounds  Goal: Interdisciplinary Rounds  Interdisciplinary team rounds were held 06/03/2017 with the following team members:Care Management, Physician and Clinical Coordinator and the patient.  Plan of care discussed. See clinical pathway and/or care plan for interventions and desired outcomes.

## 2017-06-03 NOTE — Progress Notes (Signed)
Pt given Ativan to help relax. Will continue to monitor.

## 2017-06-03 NOTE — Progress Notes (Signed)
Sleeping at present.

## 2017-06-04 ENCOUNTER — Inpatient Hospital Stay: Admit: 2017-06-04 | Payer: Self-pay

## 2017-06-04 LAB — METABOLIC PANEL, COMPREHENSIVE
A-G Ratio: 0.4 — ABNORMAL LOW (ref 1.2–3.5)
ALT (SGPT): 20 U/L (ref 12–65)
AST (SGOT): 46 U/L — ABNORMAL HIGH (ref 15–37)
Albumin: 2.3 g/dL — ABNORMAL LOW (ref 3.5–5.0)
Alk. phosphatase: 162 U/L — ABNORMAL HIGH (ref 50–136)
Anion gap: 11 mmol/L (ref 7–16)
BUN: 3 MG/DL — ABNORMAL LOW (ref 6–23)
Bilirubin, total: 1.5 MG/DL — ABNORMAL HIGH (ref 0.2–1.1)
CO2: 20 mmol/L — ABNORMAL LOW (ref 21–32)
Calcium: 8 MG/DL — ABNORMAL LOW (ref 8.3–10.4)
Chloride: 112 mmol/L — ABNORMAL HIGH (ref 98–107)
Creatinine: 0.61 MG/DL (ref 0.6–1.0)
GFR est AA: >60 mL/min/{1.73_m2} (ref >60)
GFR est non-AA: >60 mL/min/{1.73_m2} (ref >60)
Globulin: 5.2 g/dL — ABNORMAL HIGH (ref 2.3–3.5)
Glucose: 98 mg/dL (ref 65–100)
Potassium: 3.4 mmol/L — ABNORMAL LOW (ref 3.5–5.1)
Protein, total: 7.5 g/dL (ref 6.3–8.2)
Sodium: 143 mmol/L (ref 136–145)

## 2017-06-04 LAB — CBC WITH AUTOMATED DIFF
ABS. BASOPHILS: 0.1 10*3/uL (ref 0.0–0.2)
ABS. EOSINOPHILS: 0.1 10*3/uL (ref 0.0–0.8)
ABS. IMM. GRANS.: 0 10*3/uL (ref 0.0–0.5)
ABS. LYMPHOCYTES: 0.9 10*3/uL (ref 0.5–4.6)
ABS. MONOCYTES: 0.8 10*3/uL (ref 0.1–1.3)
ABS. NEUTROPHILS: 2.4 10*3/uL (ref 1.7–8.2)
ABSOLUTE NRBC: 0 10*3/uL (ref 0.0–0.2)
BASOPHILS: 1 % (ref 0.0–2.0)
EOSINOPHILS: 3 % (ref 0.5–7.8)
HCT: 41.7 % (ref 35.8–46.3)
HGB: 13.4 g/dL (ref 11.7–15.4)
IMMATURE GRANULOCYTES: 1 % (ref 0.0–5.0)
LYMPHOCYTES: 21 % (ref 13–44)
MCH: 32.1 PG (ref 26.1–32.9)
MCHC: 32.1 g/dL (ref 31.4–35.0)
MCV: 100 FL — ABNORMAL HIGH (ref 79.6–97.8)
MONOCYTES: 19 % — ABNORMAL HIGH (ref 4.0–12.0)
MPV: 10.7 FL (ref 9.4–12.3)
NEUTROPHILS: 55 % (ref 43–78)
PLATELET: 31 10*3/uL — ABNORMAL LOW (ref 150–450)
RBC: 4.17 M/uL (ref 4.05–5.2)
RDW: 18.3 %
WBC: 4.4 10*3/uL (ref 4.3–11.1)

## 2017-06-04 LAB — CULTURE, URINE
Culture result:: <10000
Culture: <10000

## 2017-06-04 LAB — MAGNESIUM: Magnesium: 1.7 mg/dL — ABNORMAL LOW (ref 1.8–2.4)

## 2017-06-04 MED ORDER — CHLORDIAZEPOXIDE 10 MG CAP
10 mg | Freq: Three times a day (TID) | ORAL | Status: DC
Start: 2017-06-04 — End: 2017-06-06
  Administered 2017-06-04 – 2017-06-06 (×5): via ORAL

## 2017-06-04 MED FILL — LORAZEPAM 2 MG/ML IJ SOLN: 2 mg/mL | INTRAMUSCULAR | Qty: 1

## 2017-06-04 MED FILL — STRESS FORMULA WITH ZINC TABLET: ORAL | Qty: 1

## 2017-06-04 MED FILL — TOPIRAMATE 25 MG TAB: 25 mg | ORAL | Qty: 1

## 2017-06-04 MED FILL — BUSPIRONE 5 MG TAB: 5 mg | ORAL | Qty: 3

## 2017-06-04 MED FILL — SERTRALINE 100 MG TAB: 100 mg | ORAL | Qty: 1

## 2017-06-04 MED FILL — FOLIC ACID 1 MG TAB: 1 mg | ORAL | Qty: 1

## 2017-06-04 MED FILL — THIAMINE HCL 100 MG TAB: 100 mg | ORAL | Qty: 1

## 2017-06-04 MED FILL — CHLORDIAZEPOXIDE 10 MG CAP: 10 mg | ORAL | Qty: 1

## 2017-06-04 MED FILL — METRONIDAZOLE 500 MG TAB: 500 mg | ORAL | Qty: 1

## 2017-06-04 MED FILL — CEFTRIAXONE 1 GRAM SOLUTION FOR INJECTION: 1 gram | INTRAMUSCULAR | Qty: 1

## 2017-06-04 MED FILL — PHENYTOIN SODIUM EXTENDED 100 MG CAP: 100 mg | ORAL | Qty: 3

## 2017-06-04 MED FILL — LORAZEPAM 1 MG TAB: 1 mg | ORAL | Qty: 1

## 2017-06-04 NOTE — Progress Notes (Signed)
Went to room due to bed alarm going off.  Patient point to IV pole and states "he has to shit".  He is talking, just listen to him

## 2017-06-04 NOTE — Progress Notes (Signed)
Ativan 1 mg slow IV given for agitation.

## 2017-06-04 NOTE — Progress Notes (Signed)
Continues to keep getting out of bed at intervals.  Confused.  Does not follow commands.  Wants her ativan.  Just had it.

## 2017-06-04 NOTE — Progress Notes (Signed)
Respirations even and unlabored. No s/sx distress. No needs at present.

## 2017-06-04 NOTE — Progress Notes (Signed)
Hospitalist Progress Note    Patient: Courtney Garcia MRN: [id number]  SSN: [id number]    Date of Birth: 1971/04/09  Age: 46 y.o.  Sex: female      Admit Date: 05/26/2017    LOS: 8 days     Subjective:     From previous notes: "46 yo female with PMH of ETOH use, HEPC with cirrhosis followed at [place], [place], seizures admitted with ETOH intoxication, suicidal ideation and depression. She has had a lot of recent social stressors. She has been seen by telepsyche and recommends inpatient psyche care but not IVC. She has been managed with ETOH withdrawal protocol. She has profound thrombocytopenia, seen by hematology. Goal to transfuse platelets if <10K / or bleeding. ABD US showed cirrhosis and splenomegaly. CTAP showed hepatomegaly/ portal hypertension. Thrombocytopenia is likely due to ETOH/ liver disease/ needs 3 week hematology followup. She has UTI, ucx pending, rocephin started 9-24, also with profuse vaginal discharge. Plans for home once improved."    9/25 - She complains of bilateral flank pain like "when I had a kidney stone." Denies F/C/N/V. Denies CP/SOB.  9/26 - She states that her back/flank pain has resolved. Now she complains of seeing people in her room and aliens coming to visit her. Denies CP/SOB. Denies F/C/N/V.    Review of systems negative except stated above.    Objective:     Visit Vitals   ??? BP 102/75   ??? Pulse 91   ??? Temp 98.4 ??F (36.9 ??C)   ??? Resp 18   ??? Ht 5' (1.524 m)   ??? Wt 58.3 kg (128 lb 9.6 oz)   ??? SpO2 94%   ??? BMI 25.12 kg/m2      Oxygen Therapy  O2 Sat (%): 94 % (06/04/17 1107)  Pulse via Oximetry: 97 beats per minute (05/26/17 1553)  O2 Device: Room air (06/04/17 0745)      Intake and Output:     Intake/Output Summary (Last 24 hours) at 06/04/17 1423  Last data filed at 06/04/17 1007   Gross per 24 hour   Intake              862 ml   Output             2400 ml   Net            -1538 ml         Physical Exam:   GENERAL: alert, cooperative, no distress, appears stated age   EYE: conjunctivae/corneas clear. PERRL.  THROAT & NECK: normal and no erythema or exudates noted.   LUNG: clear to auscultation bilaterally  HEART: regular rate and rhythm, S1S2, no murmur, no JVD  ABDOMEN: soft, non-tender, non-distended. Bowel sounds normal.   EXTREMITIES:  No edema, 2+ pedal/radial pulses bilaterally  SKIN: no rash or abnormalities  NEUROLOGIC: A&Ox3. Cranial nerves 2-12 grossly intact.    Lab/Data Review:  Recent Results (from the past 24 hour(s))   MAGNESIUM    Collection Time: 06/04/17  5:25 AM   Result Value Ref Range    Magnesium 1.7 (L) 1.8 - 2.4 mg/dL   CBC WITH AUTOMATED DIFF    Collection Time: 06/04/17  5:25 AM   Result Value Ref Range    WBC 4.4 4.3 - 11.1 K/uL    RBC 4.17 4.05 - 5.2 M/uL    HGB 13.4 11.7 - 15.4 g/dL    HCT 41.7 35.8 - 46.3 %    MCV 100.0 (H) 79.6 -  97.8 FL    MCH 32.1 26.1 - 32.9 PG    MCHC 32.1 31.4 - 35.0 g/dL    RDW 18.3 %    PLATELET 31 (L) 150 - 450 K/uL    MPV 10.7 9.4 - 12.3 FL    ABSOLUTE NRBC 0.00 0.0 - 0.2 K/uL    DF AUTOMATED      NEUTROPHILS 55 43 - 78 %    LYMPHOCYTES 21 13 - 44 %    MONOCYTES 19 (H) 4.0 - 12.0 %    EOSINOPHILS 3 0.5 - 7.8 %    BASOPHILS 1 0.0 - 2.0 %    IMMATURE GRANULOCYTES 1 0.0 - 5.0 %    ABS. NEUTROPHILS 2.4 1.7 - 8.2 K/UL    ABS. LYMPHOCYTES 0.9 0.5 - 4.6 K/UL    ABS. MONOCYTES 0.8 0.1 - 1.3 K/UL    ABS. EOSINOPHILS 0.1 0.0 - 0.8 K/UL    ABS. BASOPHILS 0.1 0.0 - 0.2 K/UL    ABS. IMM. GRANS. 0.0 0.0 - 0.5 K/UL   METABOLIC PANEL, COMPREHENSIVE    Collection Time: 06/04/17  5:25 AM   Result Value Ref Range    Sodium 143 136 - 145 mmol/L    Potassium 3.4 (L) 3.5 - 5.1 mmol/L    Chloride 112 (H) 98 - 107 mmol/L    CO2 20 (L) 21 - 32 mmol/L    Anion gap 11 7 - 16 mmol/L    Glucose 98 65 - 100 mg/dL    BUN 3 (L) 6 - 23 MG/DL    Creatinine 0.61 0.6 - 1.0 MG/DL    GFR est AA >60 >60 ml/min/1.69m    GFR est non-AA >60 >60 ml/min/1.776m   Calcium 8.0 (L) 8.3 - 10.4 MG/DL    Bilirubin, total 1.5 (H) 0.2 - 1.1 MG/DL     ALT (SGPT) 20 12 - 65 U/L    AST (SGOT) 46 (H) 15 - 37 U/L    Alk. phosphatase 162 (H) 50 - 136 U/L    Protein, total 7.5 6.3 - 8.2 g/dL    Albumin 2.3 (L) 3.5 - 5.0 g/dL    Globulin 5.2 (H) 2.3 - 3.5 g/dL    A-G Ratio 0.4 (L) 1.2 - 3.5         Imaging:  Ct Abd Pelv W Cont    Result Date: 05/29/2017  IMPRESSION: 1.  Marked steatosis, mild hepatic megaly. 2.  Retroperitoneal edema in the upper abdomen, probably due to vascular congestion associated with portal hypertension.  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  Inflammatory change associated with mild acute pancreatitis is not completely excluded.     UsKoreabd Comp    Result Date: 05/28/2017  Impression: Diffusely hyperechoic liver with borderline splenomegaly, consistent with clinical history of cirrhosis, without focal liver mass seen.    No results found for this visit on 05/26/17.    Cultures:  All Micro Results     Procedure Component Value Units Date/Time    CULTURE, URINE [[id number]]ollected:  06/02/17 0900    Order Status:  Completed Specimen:  Urine from Clean catch Updated:  06/04/17 0820     Special Requests: NO SPECIAL REQUESTS        Culture result:         <10,000 COLONIES/mL MIXED SKIN FLORA ISOLATED          Assessment/Plan:     Principal Problem:    [place] withdrawal ([place](05/27/2017)  - Slowly improving  -  A&Ox3 but with ?hallucinations  - Increase Librium to '10mg'$  TID (vs. BID)  - Ativan PRN    Active Problems:    UTI (urinary tract infection) (06/02/2017)  - UA grossly positive with bacteria and pyuria  - Continue Ceftriaxone  - Await final urine C&S  - Renal U/S unremarkable      Acute metabolic encephalopathy (09/17/3233)  - Likely multifactorial due to UTI + Etoh withdrawal  - Still with some hallucinations  - Not hepatic encephalopathy --> Ammonia 34  - Continue IV antibiotics  - Wean Librium      Bacterial Vaginosis (06/01/2017)  - Continue Flagyl for 7 day course      Cirrhosis ([place]) (05/27/2017)  - Due to Hep C +EtOH       Thrombocytopenia ([place]) (05/27/2017)  - Platelets down to 17K today  - Hematology follow and recs transfuse <10K      Hep C w/o coma, chronic ([place]) (05/27/2017)      Alcohol abuse (05/27/2017)      Seizure disorder ([place]) (05/27/2017)  - No acute seizures  - Continue Topamax  - Continue Dilantin  - Ativan PRN    Dispo -  Await final urine culture. Plan home in in next 1-2 days.    DIET REGULAR    DVT Prophylaxis: SCDs      Signed By: Faythe Casa, DO     June 04, 2017

## 2017-06-04 NOTE — Progress Notes (Signed)
Bed alarming intermittently. Repeatedly up walking in circles around room looking for random items that don't exist, then starts pacing. Continues to talk to herself in the room, but alert to self and Courtney Garcia hospital inpatient status.  Personal hygiene and hair washed by [employer]. Assisted back to bed multiple times by staff and reoriented. Now eating lunch without difficulty.

## 2017-06-04 NOTE — Progress Notes (Signed)
Shift report received from previous RN. No needs voiced by patient.

## 2017-06-04 NOTE — Progress Notes (Signed)
Ativan 1 mg po for nerves.

## 2017-06-04 NOTE — Progress Notes (Signed)
No further agitation at present.

## 2017-06-04 NOTE — Progress Notes (Signed)
CM gave patient information on recovery programs. Patient plans on staying with a friend when she's discharged. CM following.

## 2017-06-04 NOTE — Progress Notes (Signed)
Bed alarming intermittently. Repeatedly up walking in circles around room looking for random items that don't exist, then starts pacing. Talking to herself in the room, but alert [facility]. Patient made food and drinks fall in floor and bed. Personal hygiene provided by staff and linen changed. Assisted back to bed multiple times by staff and reoriented.

## 2017-06-04 NOTE — Progress Notes (Signed)
No further agitation.

## 2017-06-04 NOTE — Progress Notes (Signed)
Bed alarming intermittently. Repeatedly thinks a man is in the room wearing mask then he is staring at her from window. Reoriented to surroundings with staff members that are involved in her patient care team. Talking to herself in the room, but alert to self and [facility] inpatient status. Verbalized understanding.

## 2017-06-05 LAB — CBC WITH AUTOMATED DIFF
ABS. BASOPHILS: 0 10*3/uL (ref 0.0–0.2)
ABS. EOSINOPHILS: 0.1 10*3/uL (ref 0.0–0.8)
ABS. IMM. GRANS.: 0 10*3/uL (ref 0.0–0.5)
ABS. LYMPHOCYTES: 1 10*3/uL (ref 0.5–4.6)
ABS. MONOCYTES: 0.6 10*3/uL (ref 0.1–1.3)
ABS. NEUTROPHILS: 1.5 10*3/uL — ABNORMAL LOW (ref 1.7–8.2)
ABSOLUTE NRBC: 0 10*3/uL (ref 0.0–0.2)
BASOPHILS: 1 % (ref 0.0–2.0)
EOSINOPHILS: 4 % (ref 0.5–7.8)
HCT: 40.2 % (ref 35.8–46.3)
HGB: 12.9 g/dL (ref 11.7–15.4)
IMMATURE GRANULOCYTES: 1 % (ref 0.0–5.0)
LYMPHOCYTES: 32 % (ref 13–44)
MCH: 32.3 PG (ref 26.1–32.9)
MCHC: 32.1 g/dL (ref 31.4–35.0)
MCV: 100.5 FL — ABNORMAL HIGH (ref 79.6–97.8)
MONOCYTES: 17 % — ABNORMAL HIGH (ref 4.0–12.0)
MPV: 11.4 FL (ref 9.4–12.3)
NEUTROPHILS: 46 % (ref 43–78)
PLATELET: 20 10*3/uL — CL (ref 150–450)
RBC: 4 M/uL — ABNORMAL LOW (ref 4.05–5.2)
RDW: 18.3 %
WBC: 3.2 10*3/uL — ABNORMAL LOW (ref 4.3–11.1)

## 2017-06-05 LAB — METABOLIC PANEL, COMPREHENSIVE
A-G Ratio: 0.5 — ABNORMAL LOW (ref 1.2–3.5)
ALT (SGPT): 17 U/L (ref 12–65)
AST (SGOT): 45 U/L — ABNORMAL HIGH (ref 15–37)
Albumin: 2.2 g/dL — ABNORMAL LOW (ref 3.5–5.0)
Alk. phosphatase: 167 U/L — ABNORMAL HIGH (ref 50–136)
Anion gap: 10 mmol/L (ref 7–16)
BUN: 2 MG/DL — ABNORMAL LOW (ref 6–23)
Bilirubin, total: 1.6 MG/DL — ABNORMAL HIGH (ref 0.2–1.1)
CO2: 21 mmol/L (ref 21–32)
Calcium: 8.1 MG/DL — ABNORMAL LOW (ref 8.3–10.4)
Chloride: 113 mmol/L — ABNORMAL HIGH (ref 98–107)
Creatinine: 0.46 MG/DL — ABNORMAL LOW (ref 0.6–1.0)
GFR est AA: >60 mL/min/{1.73_m2} (ref >60)
GFR est non-AA: >60 mL/min/{1.73_m2} (ref >60)
Globulin: 4.7 g/dL — ABNORMAL HIGH (ref 2.3–3.5)
Glucose: 86 mg/dL (ref 65–100)
Potassium: 3.6 mmol/L (ref 3.5–5.1)
Protein, total: 6.9 g/dL (ref 6.3–8.2)
Sodium: 144 mmol/L (ref 136–145)

## 2017-06-05 LAB — MAGNESIUM: Magnesium: 1.8 mg/dL (ref 1.8–2.4)

## 2017-06-05 MED FILL — CEFTRIAXONE 1 GRAM SOLUTION FOR INJECTION: 1 gram | INTRAMUSCULAR | Qty: 1

## 2017-06-05 MED FILL — CHLORDIAZEPOXIDE 10 MG CAP: 10 mg | ORAL | Qty: 1

## 2017-06-05 MED FILL — THIAMINE HCL 100 MG TAB: 100 mg | ORAL | Qty: 1

## 2017-06-05 MED FILL — BUSPIRONE 5 MG TAB: 5 mg | ORAL | Qty: 3

## 2017-06-05 MED FILL — METRONIDAZOLE 500 MG TAB: 500 mg | ORAL | Qty: 1

## 2017-06-05 MED FILL — TOPIRAMATE 25 MG TAB: 25 mg | ORAL | Qty: 1

## 2017-06-05 MED FILL — FOLIC ACID 1 MG TAB: 1 mg | ORAL | Qty: 1

## 2017-06-05 MED FILL — PHENYTOIN SODIUM EXTENDED 100 MG CAP: 100 mg | ORAL | Qty: 3

## 2017-06-05 MED FILL — LORAZEPAM 1 MG TAB: 1 mg | ORAL | Qty: 1

## 2017-06-05 MED FILL — SERTRALINE 100 MG TAB: 100 mg | ORAL | Qty: 1

## 2017-06-05 MED FILL — STRESS FORMULA WITH ZINC TABLET: ORAL | Qty: 1

## 2017-06-05 NOTE — Progress Notes (Signed)
Problem: Interdisciplinary Rounds  Goal: Interdisciplinary Rounds  Interdisciplinary team rounds were held 06/05/2017 with the following team members:Care Management, Physical Therapy, Physician and Clinical Coordinator and the patient.  Plan of care discussed. See clinical pathway and/or care plan for interventions and desired outcomes.

## 2017-06-05 NOTE — Progress Notes (Signed)
Ativan 1 mg po for sleep.  Patient states she is not sleepy at all.

## 2017-06-05 NOTE — Progress Notes (Signed)
Change of shift report received.  Patient resting in bed with siderails up x2 and callbell in reach.

## 2017-06-05 NOTE — Progress Notes (Signed)
Change of shift report given.  Patient sleeping in bed at this time with siderails up x3v and callbell in reach.  Bed alarm is on at this time

## 2017-06-05 NOTE — Progress Notes (Signed)
Problem: Falls - Risk of  Goal: *Absence of Falls  Document Schmid Fall Risk and appropriate interventions in the flowsheet.   Outcome: Progressing Towards Goal  Fall Risk Interventions:  Mobility Interventions: Bed/chair exit alarm    Mentation Interventions: Bed/chair exit alarm    Medication Interventions: Bed/chair exit alarm    Elimination Interventions: Bed/chair exit alarm    History of Falls Interventions: Bed/chair exit alarm

## 2017-06-05 NOTE — Progress Notes (Signed)
Problem: Nutrition Deficit  Goal: *Optimize nutritional status  Nutrition Assessment for: Length of stay assessment.    Assessment: Patient states her stomach has been bothering her and she hasn't had a normal appetite/intake in about one month. UBW 133#. Patient agreeable to oral nutrition supplement. No weight noted PTA.     Anthropometrics:  Height: 5' (152.4 cm), Weight: 58.3 kg (128 lb 9.6 oz), Weight Source: Standing scale (comment), Body mass index is 25.12 kg/(m^2). BMI class of normal weight.     Macronutrient needs:  EER: 1458-1749 kcal/day 25-30 kcal/kg CBW (Current body weight)  EPR: 69-86 g/day 1.2-1.5 g/kg IBW (Ideal body weight)     Intake/Comparative Standards: Patient consuming 50% of regular diet per patient. This meets 79% of kcal needs and 70% of protein needs.    Malnutrition Criteria: ASPEN Malnutrition Criteria  Acute Illness, Chronic Illness, or Social/Enviornmental: Chronic illness  Energy Intake: Less than/equal to 75% of est energy req for greater than/equal to 1 month  ASPEN Malnutrition Score - Chronic Illness: 6  Chronic Illness - Malnutrition Diagnosis: Moderate malnutrition    Nutrition Diagnosis:  Inadequate oral intake related to confusion, alcohol withdrawal as evidenced by intake does not meet estimated needs, complaints of poor appetite.    Nutrition Intervention:  Medical food supplement therapy: Commercial beverage- Patient agreeable to Ensure Enlive supplement (vanilla) at breakfast and dinner. Consuming ~240 ml of ONS total, will meet her needs along with current meal intake.   Discharge Nutrition Plan: Continue Oral Nutrition Supplement (ONS) at discharge. Recommend Ensure Enlive or a comparable/similar product Once daily for 30 days unless otherwise directed by your Primary Care Physician.     Grant Fontana, RD, LD [phone]

## 2017-06-05 NOTE — Progress Notes (Signed)
Problem: Mobility Impaired (Adult and Pediatric)  Goal: *Acute Goals and Plan of Care (Insert Text)    LTG:  (1.)Courtney Garcia will move from supine to sit and sit to supine  in bed with MODIFIED INDEPENDENCE within 5 treatment day(s).    (2.)Courtney Garcia will transfer from bed to chair and chair to bed with MODIFIED INDEPENDENCE using the least restrictive device within 5 treatment day(s).    (3.)Courtney Garcia will ambulate with MODIFIED INDEPENDENCE for 50 feet with the least restrictive device within 5 treatment day(s).  ________________________________________________________________________________________________      PHYSICAL THERAPY: Initial Assessment 06/05/2017  INPATIENT: Hospital Day: 11  Payor: SELF PAY / Plan: [facility] SELF PAY / Product Type: Self Pay /      NAME/AGE/GENDER: Courtney Garcia is a 46 y.o. female   PRIMARY DIAGNOSIS: Alcohol withdrawal (HCC) Alcohol withdrawal (HCC) Alcohol withdrawal (HCC)        ICD-10: Treatment Diagnosis:    ?? Other lack of cordination (R27.8)  ?? Ataxic gait (R26.0)   Precaution/Allergies:  Review of patient's allergies indicates no known allergies.      ASSESSMENT:     Courtney Garcia presents sitting up in bed and is agreeable to PT.  She was A and O x 4.   She has a history of ETOH abuse.  She reports living with friends who don't drive and also abuse ETOH.  She is very ataxic and a high risk of falling.  Unsure if this is just from detoxing or more permanent damage from ETOH abuse.  She may benefit from an assistive device (walker vs cane) at discharge and may benefit from further therapy if she continues to abstain alcohol.  She continues to benefit from acute skilled PT to improve her functional mobility and decrease her risk of falls.      This section established at most recent assessment   PROBLEM LIST (Impairments causing functional limitations):  1. Decreased Strength  2. Decreased ADL/Functional Activities  3. Decreased Transfer Abilities   4. Decreased Ambulation Ability/Technique  5. Decreased Balance  6. Decreased Independence with Home Exercise Program   INTERVENTIONS PLANNED: (Benefits and precautions of physical therapy have been discussed with the patient.)  1. Balance Exercise  2. Bed Mobility  3. Gait Training  4. Home Exercise Program (HEP)  5. Manual Therapy  6. Neuromuscular Re-education/Strengthening  7. Therapeutic Activites  8. Therapeutic Exercise/Strengthening  9. Transfer Training  10. Group Therapy     TREATMENT PLAN: Frequency/Duration: 3 times a week for duration of hospital stay  Rehabilitation Potential For Stated Goals: Guarded     RECOMMENDED REHABILITATION/EQUIPMENT: (at time of discharge pending progress): Due to the probability of continued deficits (see above) this patient will likely need continued skilled physical therapy after discharge.  Equipment:   ? rolling walker vs cane              HISTORY:   History of Present Injury/Illness (Reason for Referral):  "46 yo female with PMH of ETOH use, HEPC with cirrhosis followed at [place], [place], seizures admitted with ETOH intoxication, suicidal ideation and depression. She has had a lot of recent social stressors. She has been seen by telepsyche and recommends inpatient psyche care but not IVC. She has been managed with ETOH withdrawal protocol. She has profound thrombocytopenia, seen by hematology. Goal to transfuse platelets if <10K / or bleeding. ABD US showed cirrhosis and splenomegaly. CTAP showed hepatomegaly/ portal hypertension. Thrombocytopenia is likely due to ETOH/ liver disease/ needs 3  week hematology followup. She has UTI, ucx pending, rocephin started 9-24, also with profuse vaginal discharge. Plans for home once improved."  Past Medical History/Comorbidities:   Courtney Garcia  has a past medical history of Cancer (HCC); Cirrhosis (HCC); Infectious disease; and Seizures (HCC).  Courtney Garcia  has a past surgical history that includes hx orthopaedic.   Social History/Living Environment:   Home Environment: Private residence  # Steps to Enter: 4  One/Two Story Residence: One story  Living Alone: No  Support Systems: Friends \\ neighbors  Patient Expects to be Discharged to:: Private residence  Current DME Used/Available at Home: None  Prior Level of Function/Work/Activity:  Modified independent     Number of Personal Factors/Comorbidities that affect the Plan of Care: 1-2: MODERATE COMPLEXITY   EXAMINATION:   Most Recent Physical Functioning:   Gross Assessment:  AROM: Generally decreased, functional  PROM: Generally decreased, functional  Strength: Generally decreased, functional  Coordination: Generally decreased, functional  Tone: Normal               Posture:  Posture (WDL): Exceptions to WDL  Posture Assessment: Forward head, Rounded shoulders  Balance:  Sitting: Intact  Standing: Impaired  Standing - Static: Constant support;Poor  Standing - Dynamic : Poor Bed Mobility:  Scooting: Supervision  Wheelchair Mobility:     Transfers:  Sit to Stand: Contact guard assistance  Stand to Sit: Contact guard assistance  Bed to Chair: Minimum assistance  Gait:     Base of Support: Widened  Speed/Cadence: Fluctuations  Step Length: Right shortened;Left shortened  Gait Abnormalities: Ataxic;Path deviations;Decreased step clearance  Distance (ft): 5 Feet (ft)  Assistive Device:  (hand held assist)  Ambulation - Level of Assistance: Minimal assistance  Interventions: Safety awareness training      Body Structures Involved:  1. Nerves  2. Eyes and Ears  3. Metabolic  4. Endocrine  5. Joints  6. Muscles  7. Ligaments Body Functions Affected:  1. Mental  2. Neuromusculoskeletal  3. Movement Related  4. Metobolic/Endocrine Activities and Participation Affected:  1. Mobility  2. Self Care  3. Domestic Life  4. Interpersonal Interactions and Relationships   Number of elements that affect the Plan of Care: 4+: HIGH COMPLEXITY   CLINICAL PRESENTATION:    Presentation: Evolving clinical presentation with changing clinical characteristics: MODERATE COMPLEXITY   CLINICAL DECISION MAKING:   [employer] AM-PAC??? ???6 Clicks???   Basic Mobility Inpatient Short Form  How much difficulty does the patient currently have... Unable A Lot A Little None   1.  Turning over in bed (including adjusting bedclothes, sheets and blankets)?    1    2    3    4   2.  Sitting down on and standing up from a chair with arms ( e.g., wheelchair, bedside commode, etc.)    1    2    3    4   3.  Moving from lying on back to sitting on the side of the bed?    1    2    3    4   How much help from another person does the patient currently need... Total A Lot A Little None   4.  Moving to and from a bed to a chair (including a wheelchair)?    1    2    3    4   5.  Need to walk in hospital room?    1   [  x] 2    3    4   6.  Climbing 3-5 steps with a railing?    1    2    3    4   ?? 2007, Trustees of [address], under license to [place], [place]. All rights reserved      Score:  Initial: 15 Most Recent: X (Date: -- )    Interpretation of Tool:  Represents activities that are increasingly more difficult (i.e. Bed mobility, Transfers, Gait).   Score 24 23 22-20 19-15 14-10 9-7 6     Modifier CH CI CJ CK CL CM CN        Payor: SELF PAY / Plan: [facility] SELF PAY / Product Type: Self Pay /      Medical Necessity:     ?? Patient is expected to demonstrate progress in balance, coordination and functional technique to improve safety during ADLs.  Reason for Services/Other Comments:  ?? Patient continues to require modification of therapeutic interventions to increase complexity of exercises.   Use of outcome tool(s) and clinical judgement create a POC that gives a: Clear prediction of patient's progress: LOW COMPLEXITY            TREATMENT:   (In addition to Assessment/Re-Assessment sessions the following treatments were rendered)    Pre-treatment Symptoms/Complaints:  No pain  Pain: Initial:   Pain Intensity 1: 0  Post Session:  0/10     Assessment/Reassessment only, no treatment provided today    Braces/Orthotics/Lines/Etc:   ?? O2 Device: Room air  Treatment/Session Assessment:    ?? Response to Treatment:  Left up in bed  ?? Interdisciplinary Collaboration:   o Registered Nurse  ?? After treatment position/precautions:   o Bed alarm/tab alert on  o Bed/Chair-wheels locked  o Side rails x 2   ?? Compliance with Program/Exercises: Will assess as treatment progresses.  ?? Recommendations/Intent for next treatment session:  "Next visit will focus on advancements to more challenging activities and reduction in assistance provided".  Total Treatment Duration:  PT Patient Time In/Time Out  Time In: 1440  Time Out: 1500    Arlyss Repress, DPT

## 2017-06-05 NOTE — Progress Notes (Signed)
Sitting in bed watching TV.  More oriented this evening.  IV infusing right arm.  Bed alarm on.  Patient remains with unsteady gait.

## 2017-06-05 NOTE — Progress Notes (Signed)
Ativan 1 mg po for nerves

## 2017-06-05 NOTE — Progress Notes (Signed)
Hospitalist Progress Note    Patient: Courtney Garcia MRN: [id number]  SSN: [id number]    Date of Birth: 1971/07/01  Age: 46 y.o.  Sex: female      Admit Date: 05/26/2017    LOS: 9 days     Subjective:     From previous notes: "46 yo female with PMH of ETOH use, HEPC with cirrhosis followed at [place], [place], seizures admitted with ETOH intoxication, suicidal ideation and depression. She has had a lot of recent social stressors. She has been seen by telepsyche and recommends inpatient psyche care but not IVC. She has been managed with ETOH withdrawal protocol. She has profound thrombocytopenia, seen by hematology. Goal to transfuse platelets if <10K / or bleeding. ABD US showed cirrhosis and splenomegaly. CTAP showed hepatomegaly/ portal hypertension. Thrombocytopenia is likely due to ETOH/ liver disease/ needs 3 week hematology followup. She has UTI, ucx pending, rocephin started 9-24, also with profuse vaginal discharge. Plans for home once improved."    9/25 - She complains of bilateral flank pain like "when I had a kidney stone." Denies F/C/N/V. Denies CP/SOB.  9/26 - She states that her back/flank pain has resolved. Now she complains of seeing people in her room and aliens coming to visit her. Denies CP/SOB. Denies F/C/N/V.  9/27 - She states that she has had some anxiety and shaking this morning "which isn't new to me" and "I'm still having hallucinations which is part of my schizophrenia." States flank pain resolved. Denies F/C/N/V.    Review of systems negative except stated above.    Objective:     Visit Vitals   ??? BP 98/68   ??? Pulse 93   ??? Temp 98.1 ??F (36.7 ??C)   ??? Resp 18   ??? Ht 5' (1.524 m)   ??? Wt 58.3 kg (128 lb 9.6 oz)   ??? SpO2 92%   ??? BMI 25.12 kg/m2      Oxygen Therapy  O2 Sat (%): 92 % (06/05/17 1059)  Pulse via Oximetry: 97 beats per minute (05/26/17 1553)  O2 Device: Room air (06/04/17 0745)      Intake and Output:     Intake/Output Summary (Last 24 hours) at 06/05/17 1514   Last data filed at 06/05/17 1007   Gross per 24 hour   Intake              240 ml   Output             2400 ml   Net            -2160 ml         Physical Exam:   GENERAL: alert, cooperative, no distress, appears stated age  EYE: conjunctivae/corneas clear. PERRL.  THROAT & NECK: normal and no erythema or exudates noted.   LUNG: clear to auscultation bilaterally  HEART: regular rate and rhythm, S1S2, no murmur, no JVD  ABDOMEN: soft, non-tender, non-distended. Bowel sounds normal.   EXTREMITIES:  No edema, 2+ pedal/radial pulses bilaterally  SKIN: no rash or abnormalities  NEUROLOGIC: A&Ox3. Cranial nerves 2-12 grossly intact.    Lab/Data Review:  Recent Results (from the past 24 hour(s))   MAGNESIUM    Collection Time: 06/05/17  6:29 AM   Result Value Ref Range    Magnesium 1.8 1.8 - 2.4 mg/dL   CBC WITH AUTOMATED DIFF    Collection Time: 06/05/17  6:29 AM   Result Value Ref Range    WBC 3.2 (L) 4.3 -  11.1 K/uL    RBC 4.00 (L) 4.05 - 5.2 M/uL    HGB 12.9 11.7 - 15.4 g/dL    HCT 40.2 35.8 - 46.3 %    MCV 100.5 (H) 79.6 - 97.8 FL    MCH 32.3 26.1 - 32.9 PG    MCHC 32.1 31.4 - 35.0 g/dL    RDW 18.3 %    PLATELET 20 (LL) 150 - 450 K/uL    MPV 11.4 9.4 - 12.3 FL    ABSOLUTE NRBC 0.00 0.0 - 0.2 K/uL    DF AUTOMATED      NEUTROPHILS 46 43 - 78 %    LYMPHOCYTES 32 13 - 44 %    MONOCYTES 17 (H) 4.0 - 12.0 %    EOSINOPHILS 4 0.5 - 7.8 %    BASOPHILS 1 0.0 - 2.0 %    IMMATURE GRANULOCYTES 1 0.0 - 5.0 %    ABS. NEUTROPHILS 1.5 (L) 1.7 - 8.2 K/UL    ABS. LYMPHOCYTES 1.0 0.5 - 4.6 K/UL    ABS. MONOCYTES 0.6 0.1 - 1.3 K/UL    ABS. EOSINOPHILS 0.1 0.0 - 0.8 K/UL    ABS. BASOPHILS 0.0 0.0 - 0.2 K/UL    ABS. IMM. GRANS. 0.0 0.0 - 0.5 K/UL   METABOLIC PANEL, COMPREHENSIVE    Collection Time: 06/05/17  6:29 AM   Result Value Ref Range    Sodium 144 136 - 145 mmol/L    Potassium 3.6 3.5 - 5.1 mmol/L    Chloride 113 (H) 98 - 107 mmol/L    CO2 21 21 - 32 mmol/L    Anion gap 10 7 - 16 mmol/L    Glucose 86 65 - 100 mg/dL     BUN 2 (L) 6 - 23 MG/DL    Creatinine 0.46 (L) 0.6 - 1.0 MG/DL    GFR est AA >60 >60 ml/min/1.44m    GFR est non-AA >60 >60 ml/min/1.768m   Calcium 8.1 (L) 8.3 - 10.4 MG/DL    Bilirubin, total 1.6 (H) 0.2 - 1.1 MG/DL    ALT (SGPT) 17 12 - 65 U/L    AST (SGOT) 45 (H) 15 - 37 U/L    Alk. phosphatase 167 (H) 50 - 136 U/L    Protein, total 6.9 6.3 - 8.2 g/dL    Albumin 2.2 (L) 3.5 - 5.0 g/dL    Globulin 4.7 (H) 2.3 - 3.5 g/dL    A-G Ratio 0.5 (L) 1.2 - 3.5         Imaging:  Ct Abd Pelv W Cont    Result Date: 05/29/2017  IMPRESSION: 1.  Marked steatosis, mild hepatic megaly. 2.  Retroperitoneal edema in the upper abdomen, probably due to vascular congestion associated with portal hypertension.  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  Inflammatory change associated with mild acute pancreatitis is not completely excluded.     Us[employer]ore[employer]d Comp    Result Date: 05/28/2017  Impression: Diffusely hyperechoic liver with borderline splenomegaly, consistent with clinical history of cirrhosis, without focal liver mass seen.    [employer]orea[employer]roperitoneum Ltd    Result Date: 06/04/2017  IMPRESSION:   Unremarkable renal ultrasound. No radiopaque shadowing calculi.     No results found for this visit on 05/26/17.    Cultures:  All Micro Results     Procedure Component Value Units Date/Time    CULTURE, URINE [[id number]]ollected:  06/02/17 0900    Order Status:  Completed Specimen:  Urine from Clean catch Updated:  06/04/17 0820     Special Requests: NO SPECIAL REQUESTS        Culture result:         <10,000 COLONIES/mL MIXED SKIN FLORA ISOLATED          Assessment/Plan:     Principal Problem:    Alcohol withdrawal ([place]) (05/27/2017)  - Resolved  - A&Ox3 with no further withdrawal symptoms  - Stop Librium tonight  - Ativan PRN    Active Problems:    UTI (urinary tract infection) (06/02/2017)  - UA grossly positive with bacteria and pyuria  - Continue Ceftriaxone x 1 more day  - Urine culture dirty  - Renal U/S unremarkable       Acute metabolic encephalopathy (7/93/9030)  - Resolved  - Likely multifactorial due to UTI + Etoh withdrawal  - Still with some hallucinations but now states she has schizophrenia history?  - Not hepatic encephalopathy --> Ammonia 34  - Continue IV antibiotics for today  - Wean Librium      Bacterial Vaginosis (06/01/2017)  - Continue Flagyl (Day 4/7)      Cirrhosis ([place]) (05/27/2017)  - Due to Hep C +EtOH      Thrombocytopenia ([place]) (05/27/2017)  - Platelets up to 20K today  - Hematology follow and recs transfuse <10K      Hep C w/o coma, chronic ([place]) (05/27/2017)      Alcohol abuse (05/27/2017)      Seizure disorder ([place]) (05/27/2017)  - No acute seizures  - Continue Topamax  - Continue Dilantin  - Ativan PRN    Dispo -  Plan home tomorrow AM    DIET REGULAR  DIET NUTRITIONAL SUPPLEMENTS Breakfast, Dinner; Ensure [employer]    DVT Prophylaxis: SCDs      Signed By: Faythe Casa, DO     June 05, 2017

## 2017-06-05 NOTE — Progress Notes (Addendum)
Placed 20g in right forearm using ultrasound.

## 2017-06-05 NOTE — Progress Notes (Signed)
Slept at intervals

## 2017-06-05 NOTE — Progress Notes (Signed)
Spiritual Care Visit, initial visit.    Visited with patient at bedside.  Listened as she spoke of the disheartening issues which she has faced, including the sudden death of her significant other.   She mentioned a number of medical issues, as well as her lack of a permanent home.   Chaplain spoke to her about spiritual issues, such as scripture reading and connecting with a church.   Prayed for patient's healing, and her physical, spiritual, and emotional health.    Visit by Demetrios Isaacsonald E. Claudette LawsWatson, Tour managertaff Chaplain. M.Ed., Th.B., B.A.

## 2017-06-05 NOTE — Progress Notes (Signed)
Ativan 1 mg po for anxiety

## 2017-06-06 ENCOUNTER — Inpatient Hospital Stay: Admit: 2017-06-06 | Payer: Self-pay

## 2017-06-06 LAB — METABOLIC PANEL, COMPREHENSIVE
A-G Ratio: 0.4 — ABNORMAL LOW (ref 1.2–3.5)
ALT (SGPT): 20 U/L (ref 12–65)
AST (SGOT): 57 U/L — ABNORMAL HIGH (ref 15–37)
Albumin: 2.2 g/dL — ABNORMAL LOW (ref 3.5–5.0)
Alk. phosphatase: 179 U/L — ABNORMAL HIGH (ref 50–136)
Anion gap: 9 mmol/L (ref 7–16)
BUN: 2 MG/DL — ABNORMAL LOW (ref 6–23)
Bilirubin, total: 1.4 MG/DL — ABNORMAL HIGH (ref 0.2–1.1)
CO2: 22 mmol/L (ref 21–32)
Calcium: 8.2 MG/DL — ABNORMAL LOW (ref 8.3–10.4)
Chloride: 110 mmol/L — ABNORMAL HIGH (ref 98–107)
Creatinine: 0.53 MG/DL — ABNORMAL LOW (ref 0.6–1.0)
GFR est AA: >60 mL/min/{1.73_m2} (ref >60)
GFR est non-AA: >60 mL/min/{1.73_m2} (ref >60)
Globulin: 5.3 g/dL — ABNORMAL HIGH (ref 2.3–3.5)
Glucose: 89 mg/dL (ref 65–100)
Potassium: 3.8 mmol/L (ref 3.5–5.1)
Protein, total: 7.5 g/dL (ref 6.3–8.2)
Sodium: 141 mmol/L (ref 136–145)

## 2017-06-06 LAB — CBC WITH AUTOMATED DIFF
ABS. BASOPHILS: 0 10*3/uL (ref 0.0–0.2)
ABS. EOSINOPHILS: 0.1 10*3/uL (ref 0.0–0.8)
ABS. IMM. GRANS.: 0 10*3/uL (ref 0.0–0.5)
ABS. LYMPHOCYTES: 1.2 10*3/uL (ref 0.5–4.6)
ABS. MONOCYTES: 0.6 10*3/uL (ref 0.1–1.3)
ABS. NEUTROPHILS: 1.7 10*3/uL (ref 1.7–8.2)
ABSOLUTE NRBC: 0 10*3/uL (ref 0.0–0.2)
BASOPHILS: 1 % (ref 0.0–2.0)
EOSINOPHILS: 4 % (ref 0.5–7.8)
HCT: 39.8 % (ref 35.8–46.3)
HGB: 12.8 g/dL (ref 11.7–15.4)
IMMATURE GRANULOCYTES: 0 % (ref 0.0–5.0)
LYMPHOCYTES: 33 % (ref 13–44)
MCH: 31.8 PG (ref 26.1–32.9)
MCHC: 32.2 g/dL (ref 31.4–35.0)
MCV: 98.8 FL — ABNORMAL HIGH (ref 79.6–97.8)
MONOCYTES: 17 % — ABNORMAL HIGH (ref 4.0–12.0)
MPV: 9.8 FL (ref 9.4–12.3)
NEUTROPHILS: 45 % (ref 43–78)
PLATELET: 22 10*3/uL
RBC: 4.03 M/uL — ABNORMAL LOW (ref 4.05–5.2)
RDW: 18.4 %
WBC: 3.6 10*3/uL — ABNORMAL LOW (ref 4.3–11.1)

## 2017-06-06 LAB — MAGNESIUM: Magnesium: 1.7 mg/dL — ABNORMAL LOW (ref 1.8–2.4)

## 2017-06-06 LAB — PLATELET COUNT: PLATELET: 40 10*3/uL

## 2017-06-06 MED ORDER — ADV ADDAPTOR
1 gram | Status: AC
Start: 2017-06-06 — End: 2017-06-06
  Administered 2017-06-06: 13:00:00 via INTRAVENOUS

## 2017-06-06 MED ORDER — MAGNESIUM SULFATE 2 GRAM/50 ML IVPB
2 gram/50 mL (4 %) | Freq: Once | INTRAVENOUS | Status: AC
Start: 2017-06-06 — End: 2017-06-06
  Administered 2017-06-06: 14:00:00 via INTRAVENOUS

## 2017-06-06 MED ORDER — METRONIDAZOLE 500 MG TAB
500 mg | ORAL_TABLET | Freq: Two times a day (BID) | ORAL | 0 refills | Status: AC
Start: 2017-06-06 — End: 2017-06-09

## 2017-06-06 MED FILL — BUSPIRONE 5 MG TAB: 5 mg | ORAL | Qty: 3

## 2017-06-06 MED FILL — LORAZEPAM 1 MG TAB: 1 mg | ORAL | Qty: 1

## 2017-06-06 MED FILL — CHLORDIAZEPOXIDE 10 MG CAP: 10 mg | ORAL | Qty: 1

## 2017-06-06 MED FILL — TOPIRAMATE 25 MG TAB: 25 mg | ORAL | Qty: 1

## 2017-06-06 MED FILL — SERTRALINE 100 MG TAB: 100 mg | ORAL | Qty: 1

## 2017-06-06 MED FILL — FOLIC ACID 1 MG TAB: 1 mg | ORAL | Qty: 1

## 2017-06-06 MED FILL — THIAMINE HCL 100 MG TAB: 100 mg | ORAL | Qty: 1

## 2017-06-06 MED FILL — FLUARIX QUAD 2018-2019 (PF) 60 MCG (15 MCG X 4)/0.5 ML IM SYRINGE: 60 mcg (15 mcg x 4)/0.5 mL | INTRAMUSCULAR | Qty: 0.5

## 2017-06-06 MED FILL — STRESS FORMULA WITH ZINC TABLET: ORAL | Qty: 1

## 2017-06-06 MED FILL — MAGNESIUM SULFATE 2 GRAM/50 ML IVPB: 2 gram/50 mL (4 %) | INTRAVENOUS | Qty: 50

## 2017-06-06 MED FILL — PHENYTOIN SODIUM EXTENDED 100 MG CAP: 100 mg | ORAL | Qty: 3

## 2017-06-06 MED FILL — CEFTRIAXONE 1 GRAM SOLUTION FOR INJECTION: 1 gram | INTRAMUSCULAR | Qty: 1

## 2017-06-06 MED FILL — METRONIDAZOLE 500 MG TAB: 500 mg | ORAL | Qty: 1

## 2017-06-06 NOTE — Progress Notes (Signed)
Discharged to home without s/sx distress. No needs at present. Accompanied off floor by staff.

## 2017-06-06 NOTE — Progress Notes (Signed)
Discharge instructions, follow up information, medication list, and prescription information provided and explained to the pt. Patient given voucher for flagyl prescription and information for follow up at the [facility] on [facility].  Returned patient's home medications of Topiramate, Buspirone, and Sertralin in a sealed bag from the medication lock box, no other meds in box.  IV removed from right arm, no remote telemetry on.  Patient given information for faith home recovery with phone number to call.   Opportunity for questions provided. Patient states needs a ride home,  Address in demographics verified by patient and yellow cab arranged by Nurse Supervisor, Hilbert Corrigan.

## 2017-06-06 NOTE — Discharge Summary (Signed)
Hospitalist Discharge Summary     Patient ID:  Courtney Garcia  [id number]  46 y.o.  01-03-1971  Admit date: 05/26/2017  4:04 PM  Discharge date and time: 06/06/2017  Attending: Jackelyn Hoehn, MD  PCP:  None  Treatment Team: Attending Provider: Jackelyn Hoehn, MD; Care Manager: Murvin Natal. [name]    Principal Diagnosis Alcohol withdrawal [facility])    Hospital Problems as of 06/06/2017  Never Reviewed          Codes Class Noted - Resolved POA    UTI (urinary tract infection) ICD-10-CM: N39.0  ICD-9-CM: 599.0  06/02/2017 - Present No        * (Principal)Alcohol withdrawal (HCC) ICD-10-CM: O13.086  ICD-9-CM: 291.81  05/27/2017 - Present Yes        Acute metabolic encephalopathy ICD-10-CM: G93.41  ICD-9-CM: 348.31  06/03/2017 - Present Yes        Bacterial vaginosis ICD-10-CM: N76.0, B96.89  ICD-9-CM: 616.10, 041.9  06/03/2017 - Present Clinically Undetermined        Cirrhosis (HCC) ICD-10-CM: K74.60  ICD-9-CM: 571.5  05/27/2017 - Present Yes        Thrombocytopenia (HCC) ICD-10-CM: D69.6  ICD-9-CM: 287.5  05/27/2017 - Present Yes        Hep C w/o coma, chronic (HCC) ICD-10-CM: B18.2  ICD-9-CM: 070.54  05/27/2017 - Present Yes        Alcohol abuse ICD-10-CM: F10.10  ICD-9-CM: 305.00  05/27/2017 - Present Yes        Seizure disorder (HCC) ICD-10-CM: G40.909  ICD-9-CM: 345.90  05/27/2017 - Present Yes                  Hospital Course:  46 yo female with PMH of ETOH use, HEPC with cirrhosis followed at [place], [place], seizures admitted with ETOH intoxication, suicidal ideation and depression. She has had a lot of recent social stressors. She had been seen by telepsyche and recommended inpatient psyche care but not IVC. She has been managed with ETOH withdrawal protocol. She has profound thrombocytopenia, seen by hematology. Goal to transfuse platelets if <10K / or bleeding. ABD US showed cirrhosis and splenomegaly. CTAP showed hepatomegaly/ portal hypertension. Thrombocytopenia is likely due to ETOH/  liver disease/ needs 3 week hematology followup. She had UTI that was fully treated in the hospital. She also had bacterial vaginosis and was started on Flagyl. Once it was time for discharge, she complained of hallucinations and stated that she's been fighting these since being diagnosed with schizophrenia (which she had not had an issue with before) then complained of not being able to walk so PT saw her. She then complained of chest pain so EKG and chest x-ray were normal. She was discharged in stable condition to a friends house. She is a high risk for returning to the ER due to her social and life situation.    Significant Diagnostic Studies:    Labs: Results:       Chemistry Recent Labs      06/06/17   0623  06/05/17   0629  06/04/17   0525   GLU  89  86  98   NA  141  144  143   K  3.8  3.6  3.4*   CL  110*  113*  112*   CO2  22  21  20*   BUN  2*  2*  3*   CREA  0.53*  0.46*  0.61   CA  8.2*  8.1*  8.0*  AGAP  AP  179*  167*  162*   TP  7.5  6.9  7.5   ALB  2.2*  2.2*  2.3*   GLOB  5.3*  4.7*  5.2*   AGRAT  0.4*  0.5*  0.4*      CBC w/Diff Recent Labs      06/06/17   0803  06/06/17   0623  06/05/17   0629  06/04/17   0525   WBC   --   3.6*  3.2*  4.4   RBC   --   4.03*  4.00*  4.17   HGB   --   12.8  12.9  13.4   HCT   --   39.8  40.2  41.7   PLT  40*  22  20*  31*   GRANS   --   45  46  55   LYMPH   --   33  32  21   EOS   --   Cardiac Enzymes No results for input(s): CPK, CKND1, MYO in the last 72 hours.    No lab exists for component: CKRMB, TROIP   Coagulation No results for input(s): PTP, INR, APTT in the last 72 hours.    No lab exists for component: INREXT    Lipid Panel No results found for: CHOL, CHOLPOCT, CHOLX, CHLST, CHOLV, 884269, HDL, LDL, LDLC, DLDLP, 098119, VLDLC, VLDL, TGLX, TRIGL, TRIGP, TGLPOCT, CHHD, CHHDX   BNP No results for input(s): BNPP in the last 72 hours.   Liver Enzymes Recent Labs      06/06/17   0623   TP  7.5   ALB  2.2*   AP  179*   SGOT  57*       Thyroid Studies No results found for: T4, T3U, TSH, TSHEXT         Imaging:  Xr Chest Sngl V    Result Date: 06/06/2017  IMPRESSION:  Negative for acute change .     Ct Abd Pelv W Cont    Result Date: 05/29/2017  IMPRESSION: 1.  Marked steatosis, mild hepatic megaly. 2.  Retroperitoneal edema in the upper abdomen, probably due to vascular congestion associated with portal hypertension.  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  Inflammatory change associated with mild acute pancreatitis is not completely excluded.     [place] Abd Comp    Result Date: 05/28/2017  Impression: Diffusely hyperechoic liver with borderline splenomegaly, consistent with clinical history of cirrhosis, without focal liver mass seen.    [place] Retroperitoneum [employer]    Result Date: 06/04/2017  IMPRESSION:   Unremarkable renal ultrasound. No radiopaque shadowing calculi.       Microbiology/Cultures:  All Micro Results     Procedure Component Value Units Date/Time    CULTURE, URINE [[id number]] Collected:  06/02/17 0900    Order Status:  Completed Specimen:  Urine from Clean catch Updated:  06/04/17 0820     Special Requests: NO SPECIAL REQUESTS        Culture result:         <10,000 COLONIES/mL MIXED SKIN FLORA ISOLATED          Discharge Exam:  Visit Vitals   ??? BP 108/76   ??? Pulse 80   ??? Temp 97.8 ??F (36.6 ??C)   ??? Resp 19   ??? Ht 5' (1.524 m)   ???  Wt 58.3 kg (128 lb 9.6 oz)   ??? SpO2 97%   ??? BMI 25.12 kg/m2     General appearance: alert, cooperative, no distress, appears stated age  Lungs: clear to auscultation bilaterally  Heart: regular rate and rhythm, S1, S2 normal, no murmur, click, rub or gallop  Abdomen: soft, non-tender. Bowel sounds normal. No masses,  no organomegaly  Extremities: no cyanosis or edema  Neurologic: Grossly normal    Disposition: home  Discharge Condition: stable  Patient Instructions:   Current Discharge Medication List      START taking these medications    Details    metroNIDAZOLE (FLAGYL) 500 mg tablet Take 1 Tab by mouth every twelve (12) hours for 3 days.  Qty: 6 Tab, Refills: 0         CONTINUE these medications which have NOT CHANGED    Details   phenytoin ER (DILANTIN EXTENDED) 100 mg ER capsule Take 300 mg by mouth nightly.      topiramate (TOPAMAX) 25 mg tablet Take 25 mg by mouth two (2) times a day.      busPIRone (BUSPAR) 15 mg tablet Take 15 mg by mouth three (3) times daily.      sertraline (ZOLOFT) 100 mg tablet Take 100 mg by mouth daily.      magnesium oxide (MAG-OX) 400 mg tablet Take 1 Tab by mouth daily.  Qty: 10 Tab, Refills: 0      LORazepam (ATIVAN) 1 mg tablet Take 1 Tab by mouth every eight (8) hours as needed for Anxiety. Max Daily Amount: 3 mg.  Qty: 15 Tab, Refills: 0             Activity: Activity as tolerated  Diet: Regular Diet  Wound Care: None needed    Follow-up  ??   Free clinic in 3-5 days  ?? Hematology in 2 weeks  Time spent to discharge patient 35 minutes  Signed:  Cammy CopaWoodson E Yazmen Briones, DO  06/06/2017  10:03 AM

## 2017-06-06 NOTE — Progress Notes (Signed)
Shift report received from previous RN. No needs voiced by patient.

## 2017-06-06 NOTE — Progress Notes (Signed)
Slept at intervals without complaints.  Found patient standing at foot of bed stretching IV tubing to the max, bed alarm going off, trying to put trash in waste basket and going to bathroom without calling for help.  Still very unsteady on her feet.  Assisted to bathroom and then back to bed.

## 2017-06-06 NOTE — Progress Notes (Signed)
No further c/o anxiety.

## 2017-06-06 NOTE — Progress Notes (Signed)
Ativan 1 mg po given for anxiety.

## 2017-06-06 NOTE — Discharge Summary (Signed)
Discharge Summary by Cammy Copa, DO at 06/06/17 1003                Author: Cammy Copa, DO  Service: Internal Medicine  Author Type: Physician       Filed: 06/06/17 1007  Date of Service: 06/06/17 1003  Status: Signed          Editor: Cammy Copa, DO (Physician)                                   Hospitalist Discharge Summary        Patient ID:   Courtney Garcia   [id number]   45 y.o.   15-Mar-1971   Admit date: 05/26/2017  4:04 PM   Discharge date and time: 06/06/2017   Attending: Jackelyn Hoehn, MD   PCP:  None   Treatment Team: Attending Provider: Jackelyn Hoehn, MD; Care Manager: Murvin Natal. [name]      Principal Diagnosis Alcohol withdrawal [facility])         Hospital Problems as of 06/06/2017   Never Reviewed                Codes  Class  Noted - Resolved  POA              UTI (urinary tract infection)  ICD-10-CM: N39.0   ICD-9-CM: 599.0    06/02/2017 - Present  No                        * (Principal)Alcohol withdrawal (HCC)  ICD-10-CM: Y86.578   ICD-9-CM: 291.81    05/27/2017 - Present  Yes                        Acute metabolic encephalopathy  ICD-10-CM: G93.41   ICD-9-CM: 348.31    06/03/2017 - Present  Yes                        Bacterial vaginosis  ICD-10-CM: N76.0, B96.89   ICD-9-CM: 616.10, 041.9    06/03/2017 - Present  Clinically Undetermined                        Cirrhosis (HCC)  ICD-10-CM: K74.60   ICD-9-CM: 571.5    05/27/2017 - Present  Yes                        Thrombocytopenia (HCC)  ICD-10-CM: D69.6   ICD-9-CM: 287.5    05/27/2017 - Present  Yes                        Hep C w/o coma, chronic (HCC)  ICD-10-CM: B18.2   ICD-9-CM: 070.54    05/27/2017 - Present  Yes                        Alcohol abuse  ICD-10-CM: F10.10   ICD-9-CM: 305.00    05/27/2017 - Present  Yes                        Seizure disorder (HCC)  ICD-10-CM: G40.909   ICD-9-CM: 345.90    05/27/2017 - Present  Yes  Hospital Course:   46 yo female with PMH of ETOH use, HEPC with cirrhosis followed  at [place], [place], seizures admitted with ETOH intoxication, suicidal ideation and depression. She has had a  lot of recent social stressors. She had been seen by telepsyche and recommended inpatient psyche care but not IVC. She has been managed with ETOH withdrawal protocol. She has profound thrombocytopenia, seen by hematology. Goal to transfuse platelets if  <10K / or bleeding. ABD US showed cirrhosis and splenomegaly. CTAP showed hepatomegaly/ portal hypertension. Thrombocytopenia is likely due to ETOH/ liver disease/ needs 3 week hematology followup. She had UTI that was fully treated in the hospital. She  also had bacterial vaginosis and was started on Flagyl. Once it was time for discharge, she complained of hallucinations and stated that she's been fighting these since being diagnosed with schizophrenia (which she had not had an issue with before) then  complained of not being able to walk so PT saw her. She then complained of chest pain so EKG and chest x-ray were normal. She was discharged in stable condition to a friends house. She is a high risk for returning to the ER due to her social and life  situation.      Significant Diagnostic Studies:         Labs:  Results:                  Chemistry  Recent Labs          06/06/17    0623   06/05/17    0629   06/04/17    0525      GLU   89   86   98      NA   141   144   143      K   3.8   3.6   3.4*      CL   110*   113*   112*      CO2   22   21   20*      BUN   2*   2*   3*      CREA   0.53*   0.46*   0.61      CA   8.2*   8.1*   8.0*      AGAP   AP   179*   167*   162*      TP   7.5   6.9   7.5      ALB   2.2*   2.2*   2.3*      GLOB   5.3*   4.7*   5.2*      AGRAT   0.4*   0.5*   0.4*              CBC w/Diff  Recent Labs          06/06/17    0803   06/06/17    0623   06/05/17    0629   06/04/17    0525      WBC    --    3.6*   3.2*   4.4      RBC    --    4.03*   4.00*   4.17      HGB    --    12.8   12.9   13.4  HCT    --    39.8    40.2   41.7      PLT   40*   22   20*   31*      GRANS    --    45   46   55      LYMPH    --    33   32   21      EOS    --    Cardiac Enzymes  No results for input(s): CPK, CKND1, MYO in the last 72 hours.      No lab exists for component: CKRMB, TROIP     Coagulation  No results for input(s): PTP, INR, APTT in the last 72 hours.      No lab exists for component: INREXT      Lipid Panel  No results found for: CHOL, CHOLPOCT, CHOLX, CHLST, CHOLV, 884269, HDL, LDL, LDLC, DLDLP, 161096, VLDLC, VLDL, TGLX, TRIGL, TRIGP, TGLPOCT, CHHD, CHHDX     BNP  No results for input(s): BNPP in the last 72 hours.        Liver Enzymes  Recent Labs          06/06/17    0623      TP   7.5      ALB   2.2*      AP   179*      SGOT   57*                 Thyroid Studies  No results found for: T4, T3U, TSH, TSHEXT             Imaging:   Xr Chest Sngl V      Result Date: 06/06/2017   IMPRESSION:  Negative for acute change .       Ct Abd Pelv W Cont      Result Date: 05/29/2017   IMPRESSION: 1.  Marked steatosis, mild hepatic megaly. 2.  Retroperitoneal edema in the upper abdomen, probably due to vascular congestion associated with portal hypertension.  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  Inflammatory change associated  with mild acute pancreatitis is not completely excluded.       [place] Abd Comp      Result Date: 05/28/2017   Impression: Diffusely hyperechoic liver with borderline splenomegaly, consistent with clinical history of cirrhosis, without focal liver mass seen.      [place] Retroperitoneum Ltd      Result Date: 06/04/2017   IMPRESSION:   Unremarkable renal ultrasound. No radiopaque shadowing calculi.          Microbiology/Cultures:     All Micro Results        Procedure  Component  Value  Units  Date/Time           CULTURE, URINE [[id number]]  Collected:  06/02/17 0900            Order Status:  Completed  Specimen:  Urine from Clean catch  Updated:  06/04/17 0820                Special Requests:  NO SPECIAL  REQUESTS              Culture result:                    <10,000  COLONIES/mL MIXED SKIN FLORA ISOLATED                Discharge Exam:     Visit Vitals         ?  BP  108/76     ?  Pulse  80     ?  Temp  97.8 ??F (36.6 ??C)     ?  Resp  19     ?  Ht  5' (1.524 m)     ?  Wt  58.3 kg (128 lb 9.6 oz)     ?  SpO2  97%         ?  BMI  25.12 kg/m2        General appearance: alert, cooperative, no distress, appears stated age   Lungs: clear to auscultation bilaterally   Heart: regular rate and rhythm, S1, S2 normal, no murmur, click, rub or gallop   Abdomen: soft, non-tender. Bowel sounds normal. No masses,  no organomegaly   Extremities: no cyanosis or edema   Neurologic: Grossly normal      Disposition: home   Discharge Condition: stable   Patient Instructions:      Current Discharge Medication List              START taking these medications          Details        metroNIDAZOLE (FLAGYL) 500 mg tablet  Take 1 Tab by mouth every twelve (12) hours for 3 days.   Qty: 6 Tab, Refills:  0                     CONTINUE these medications which have NOT CHANGED          Details        phenytoin ER (DILANTIN EXTENDED) 100 mg ER capsule  Take 300 mg by mouth nightly.               topiramate (TOPAMAX) 25 mg tablet  Take 25 mg by mouth two (2) times a day.               busPIRone (BUSPAR) 15 mg tablet  Take 15 mg by mouth three (3) times daily.               sertraline (ZOLOFT) 100 mg tablet  Take 100 mg by mouth daily.               magnesium oxide (MAG-OX) 400 mg tablet  Take 1 Tab by mouth daily.   Qty: 10 Tab, Refills:  0               LORazepam (ATIVAN) 1 mg tablet  Take 1 Tab by mouth every eight (8) hours as needed for Anxiety. Max Daily Amount: 3 mg.   Qty: 15 Tab, Refills:  0                         Activity: Activity as tolerated   Diet: Regular Diet   Wound Care: None needed      Follow-up   ??    Free clinic in 3-5 days   ??  Hematology in 2 weeks   Time spent to discharge patient 35 minutes   Signed:   Cammy CopaWoodson E Idalie Canto,  DO   06/06/2017   10:03 AM

## 2017-06-11 ENCOUNTER — Encounter

## 2017-06-12 ENCOUNTER — Inpatient Hospital Stay: Admit: 2017-06-12 | Payer: Self-pay

## 2017-06-12 ENCOUNTER — Ambulatory Visit: Admit: 2017-06-12 | Discharge: 2017-06-12 | Attending: Hematology & Oncology

## 2017-06-12 DIAGNOSIS — D696 Thrombocytopenia, unspecified: Secondary | ICD-10-CM

## 2017-06-12 LAB — CBC WITH AUTOMATED DIFF
ABS. BASOPHILS: 0.1 10*3/uL (ref 0.0–0.2)
ABS. EOSINOPHILS: 0.3 10*3/uL (ref 0.0–0.8)
ABS. IMM. GRANS.: 0 10*3/uL (ref 0.0–0.5)
ABS. LYMPHOCYTES: 2 10*3/uL (ref 0.5–4.6)
ABS. MONOCYTES: 0.6 10*3/uL (ref 0.1–1.3)
ABS. NEUTROPHILS: 2.8 10*3/uL (ref 1.7–8.2)
ABSOLUTE NRBC: 0 10*3/uL (ref 0.0–0.2)
BASOPHILS: 1 % (ref 0.0–2.0)
EOSINOPHILS: 5 % (ref 0.5–7.8)
HCT: 39.5 % (ref 35.8–46.3)
HGB: 13.2 g/dL (ref 11.7–15.4)
IMMATURE GRANULOCYTES: 0 % (ref 0.0–5.0)
LYMPHOCYTES: 34 % (ref 13–44)
MCH: 32.2 PG (ref 26.1–32.9)
MCHC: 33.4 g/dL (ref 31.4–35.0)
MCV: 96.3 FL (ref 79.6–97.8)
MONOCYTES: 11 % (ref 4.0–12.0)
MPV: 11.6 FL (ref 9.4–12.3)
NEUTROPHILS: 49 % (ref 43–78)
PLATELET: 99 10*3/uL — ABNORMAL LOW (ref 150–450)
RBC: 4.1 M/uL (ref 4.05–5.25)
RDW: 18.4 % — ABNORMAL HIGH (ref 11.9–14.6)
WBC: 5.8 10*3/uL (ref 4.3–11.1)

## 2017-06-12 LAB — METABOLIC PANEL, COMPREHENSIVE
A-G Ratio: 0.5 — ABNORMAL LOW (ref 1.2–3.5)
ALT (SGPT): 36 U/L (ref 12–65)
AST (SGOT): 80 U/L — ABNORMAL HIGH (ref 15–37)
Albumin: 2.7 g/dL — ABNORMAL LOW (ref 3.5–5.0)
Alk. phosphatase: 206 U/L — ABNORMAL HIGH (ref 50–136)
Anion gap: 9 mmol/L (ref 7–16)
BUN: 5 MG/DL — ABNORMAL LOW (ref 6–23)
Bilirubin, total: 0.7 MG/DL (ref 0.2–1.1)
CO2: 24 mmol/L (ref 21–32)
Calcium: 8.2 MG/DL — ABNORMAL LOW (ref 8.3–10.4)
Chloride: 110 mmol/L — ABNORMAL HIGH (ref 98–107)
Creatinine: 0.56 MG/DL — ABNORMAL LOW (ref 0.6–1.0)
GFR est AA: >60 mL/min/{1.73_m2} (ref >60)
GFR est non-AA: >60 mL/min/{1.73_m2} (ref >60)
Globulin: 5.5 g/dL — ABNORMAL HIGH (ref 2.3–3.5)
Glucose: 90 mg/dL (ref 65–100)
Potassium: 3 mmol/L — ABNORMAL LOW (ref 3.5–5.1)
Protein, total: 8.2 g/dL (ref 6.3–8.2)
Sodium: 143 mmol/L (ref 136–145)

## 2017-06-12 NOTE — Patient Instructions (Addendum)
Patient Instructions from Today's Visit    Reason for Visit:  Hospital follow up      Plan:  We will send you to a gastroenterologist and a primary care doctor   Call [facility] to set up the appointment that you missed. They will be your primary care providers until you have insurance. They can get you set up with a liver doctor. Their number is [phone].    If they are not able to set you up with a liver specialist call and let us know. Our number is [phone].     You will have an ultrasound prior to your follow up appointment.    Follow Up:  Follow up with Dr. Faith Rogue in 6 months    Recent Lab Results:  Recent Results (from the past 12 hour(s))   CBC WITH AUTOMATED DIFF    Collection Time: 06/12/17  2:42 PM   Result Value Ref Range    WBC 5.8 4.3 - 11.1 K/uL    RBC 4.10 4.05 - 5.25 M/uL    HGB 13.2 11.7 - 15.4 g/dL    HCT 39.5 35.8 - 46.3 %    MCV 96.3 79.6 - 97.8 FL    MCH 32.2 26.1 - 32.9 PG    MCHC 33.4 31.4 - 35.0 g/dL    RDW 18.4 (H) 11.9 - 14.6 %    PLATELET 99 (L) 150 - 450 K/uL    MPV 11.6 9.4 - 12.3 FL    ABSOLUTE NRBC 0.00 0.0 - 0.2 K/uL    NEUTROPHILS 49 43 - 78 %    LYMPHOCYTES 34 13 - 44 %    MONOCYTES 11 4.0 - 12.0 %    EOSINOPHILS 5 0.5 - 7.8 %    BASOPHILS 1 0.0 - 2.0 %    IMMATURE GRANULOCYTES 0 0.0 - 5.0 %    ABS. NEUTROPHILS 2.8 1.7 - 8.2 K/UL    ABS. LYMPHOCYTES 2.0 0.5 - 4.6 K/UL    ABS. MONOCYTES 0.6 0.1 - 1.3 K/UL    ABS. EOSINOPHILS 0.3 0.0 - 0.8 K/UL    ABS. BASOPHILS 0.1 0.0 - 0.2 K/UL    ABS. IMM. GRANS. 0.0 0.0 - 0.5 K/UL    RBC COMMENTS MODERATE  ANISOCYTOSIS        RBC COMMENTS SLIGHT  POIKILOCYTOSIS        RBC COMMENTS OCCASIONAL  STOMATOCYTES        WBC COMMENTS Result Confirmed By Smear      PLATELET COMMENTS MODERATE      DF AUTOMATED     METABOLIC PANEL, COMPREHENSIVE    Collection Time: 06/12/17  2:42 PM   Result Value Ref Range    Sodium 143 136 - 145 mmol/L    Potassium 3.0 (L) 3.5 - 5.1 mmol/L    Chloride 110 (H) 98 - 107 mmol/L    CO2 24 21 - 32 mmol/L     Anion gap 9 7 - 16 mmol/L    Glucose 90 65 - 100 mg/dL    BUN 5 (L) 6 - 23 MG/DL    Creatinine 0.56 (L) 0.6 - 1.0 MG/DL    GFR est AA >60 >60 ml/min/1.6m    GFR est non-AA >60 >60 ml/min/1.737m   Calcium 8.2 (L) 8.3 - 10.4 MG/DL    Bilirubin, total 0.7 0.2 - 1.1 MG/DL    ALT (SGPT) 36 12 - 65 U/L    AST (SGOT) 80 (H) 15 - 37 U/L  Alk. phosphatase 206 (H) 50 - 136 U/L    Protein, total 8.2 6.3 - 8.2 g/dL    Albumin 2.7 (L) 3.5 - 5.0 g/dL    Globulin 5.5 (H) 2.3 - 3.5 g/dL    A-G Ratio 0.5 (L) 1.2 - 3.5         Care plan has been discussed and given to patient: n/a        -------------------------------------------------------------------------------------------------------------------  Please call our office at [phone] if you have any  of the following symptoms:   ?? Fever of 100.5 or greater  ?? Chills  ?? Shortness of breath  ?? Swelling or pain in one leg    After office hours an answering service is available and will contact a provider for emergencies or if you are experiencing any of the above symptoms.    ? Patient did express an interest in My Chart.  My Chart log in information explained on the after visit summary printout at the [place] desk.    Loel Lofty, RN

## 2017-06-12 NOTE — Progress Notes (Signed)
Data Source: Patient, [facility] record.    06/12/2017    3:47 PM    ENIOLA CERULLO [id number]    46 y.o.      Patient Encounter: [facility] Visit    Heme Diagnosis:  Thrombocytopenia  Heme History (Copied from prior):   Pt recently admitted from 9/17-28/18. Per inpt consult note: " 79 female h/o HCV, cirrhosis, ongoing EtOH abuse, seizure disorder, now admitted w stress after recent boyfriend passing. We are consulted for severe thrombocytopenia: this is likely cirrhosis and ongoing ETOH abuse related: will get peripheral blood smear, DIC panel as well as abd imaging to assess liver and spleen. Will also check AFP and iron stores. Transfuse if bleeding or plt's drop below 10,000. She also reports being told of 'possible pancreas cancer' 3 years ago during a hospitalization: will try to get past records  - CT AP w normal pancrease.   - HIV -ve  Interval History:  10/18: Now staying w friend. Thinks she may stay in [place] for now (previously was living in [place]). Awaiting disability. Continues to report heavy ETOH use. Needs to estalbish primary care. Advised f/u w GI as needed.   NCCN Distress Score:  -    REVIEW OF SYSTEMS:  As mentioned above, all other systems were reviewed in full and are negative.    Past Medical History:   Diagnosis Date   ??? Cancer ([place])     liver and pancreatic   ??? Cirrhosis ([place])    ??? Infectious disease     Hep C   ??? Seizures ([facility])        Past Surgical History:   Procedure Laterality Date   ??? HX ORTHOPAEDIC      right shoulder       Current Outpatient Prescriptions   Medication Sig Dispense Refill   ??? phenytoin ER (DILANTIN EXTENDED) 100 mg ER capsule Take 300 mg by mouth nightly.     ??? topiramate (TOPAMAX) 25 mg tablet Take 25 mg by mouth two (2) times a day.     ??? busPIRone (BUSPAR) 15 mg tablet Take 15 mg by mouth three (3) times daily.     ??? sertraline (ZOLOFT) 100 mg tablet Take 100 mg by mouth daily.      ??? LORazepam (ATIVAN) 1 mg tablet Take 1 Tab by mouth every eight (8) hours as needed for Anxiety. Max Daily Amount: 3 mg. (Patient taking differently: Take 1 mg by mouth every four (4) hours as needed for Anxiety.) 15 Tab 0   ??? magnesium oxide (MAG-OX) 400 mg tablet Take 1 Tab by mouth daily. 10 Tab 0       Social History     Social History   ??? Marital status: MARRIED     Spouse name: N/A   ??? Number of children: N/A   ??? Years of education: N/A     Social History Main Topics   ??? Smoking status: Never Smoker   ??? Smokeless tobacco: Never Used   ??? Alcohol use Yes      Comment: "at least a gallon of vodka a day"   ??? Drug use: Yes     Special: Cocaine   ??? Sexual activity: Not Asked     Other Topics Concern   ??? None     Social History Narrative       No family history on file.    No Known Allergies    PHYSICAL EXAMINATION:  General Appearance: Healthy appearing  patient in no acute distress  Vitals reviewed.   Visit Vitals   ??? BP 102/64 (BP 1 Location: Left arm, BP Patient Position: Sitting)   ??? Pulse 95   ??? Temp 97.8 ??F (36.6 ??C) (Oral)   ??? Resp 20   ??? Ht 5' (1.524 m)   ??? Wt 133 lb 4.8 oz (60.5 kg)   ??? SpO2 95%   ??? BMI 26.03 kg/m2     HEENT: No oral or pharyngeal masses, ulceration or thrush noted, no sinus tenderness.  Neck is supple with no thyromegaly or JVD noted.  Lymph Nodes: No lymphadenopathy noted in the occipital, pre and post auricular, cervical, supra and infraclavicular, axillary, epitrochlear, inguinal, and popliteal region.  Breasts: No palpable masses, nipple discharge or skin retraction  Lungs/Thorax: Clear to auscultation, no accessory muscles of respiration being used.  Heart: Regular rate and rhythm, normal S1, S2, no appreciable murmurs, rubs, gallops  Abdomen: Soft, nontender, bowel sounds present, no appreciable hepatosplenomegaly, no palpable masses  Extremeties: Good pulses bilaterally, no peripheral edema.  Skin: Normal skin tone with no rash, petechiae, ecchymosis noted.   Musculoskeletal: No pain on palpation over bony prominence, no edema, no evidence of gout, no joint or bony deformity  Neurologic: Grossly intact        LABS/IMAGING:    Lab Results   Component Value Date/Time    WBC 5.8 06/12/2017 02:42 PM    HGB 13.2 06/12/2017 02:42 PM    HCT 39.5 06/12/2017 02:42 PM    PLATELET 99 (L) 06/12/2017 02:42 PM    MCV 96.3 06/12/2017 02:42 PM       Lab Results   Component Value Date/Time    Sodium 143 06/12/2017 02:42 PM    Potassium 3.0 (L) 06/12/2017 02:42 PM    Chloride 110 (H) 06/12/2017 02:42 PM    CO2 24 06/12/2017 02:42 PM    Anion gap 9 06/12/2017 02:42 PM    Glucose 90 06/12/2017 02:42 PM    BUN 5 (L) 06/12/2017 02:42 PM    Creatinine 0.56 (L) 06/12/2017 02:42 PM    GFR est AA >60 06/12/2017 02:42 PM    GFR est non-AA >60 06/12/2017 02:42 PM    Calcium 8.2 (L) 06/12/2017 02:42 PM    AST (SGOT) 80 (H) 06/12/2017 02:42 PM    Alk. phosphatase 206 (H) 06/12/2017 02:42 PM    Protein, total 8.2 06/12/2017 02:42 PM    Albumin 2.7 (L) 06/12/2017 02:42 PM    Globulin 5.5 (H) 06/12/2017 02:42 PM    A-G Ratio 0.5 (L) 06/12/2017 02:42 PM    ALT (SGPT) 36 06/12/2017 02:42 PM             Above results reviewed with patient.    ASSESSMENT:  Pt recently admitted from 9/17-28/18. Per inpt consult note: " 71 female h/o HCV, cirrhosis, ongoing EtOH abuse, seizure disorder, now admitted w stress after recent boyfriend passing and suicidal ideation. We are consulted for severe thrombocytopenia: this is likely cirrhosis and ongoing ETOH abuse related: will get peripheral blood smear, DIC panel as well as abd imaging to assess liver and spleen. Will also check AFP and iron stores. Transfuse if bleeding or plt's drop below 10,000. She also reports being told of 'possible pancreas cancer' 3 years ago during a hospitalization: will try to get past records.   - Peripheral blood smear with true thrombocytopenia, no atypical cells seen. DIC panel unremarkable. HIV -ve. Abd [place] w diffusely hyperechoic  liver with borderline  splenomegaly (13.1 cm), consistent with  clinical history of cirrhosis, without focal liver mass seen    10/18: recently discharged. Now staying w friend temporarily, reports back to drinking, currently "half a gallon of vodka daily". Plans to stay in [place]: advised to seek out a primary care provider. Labs today w improved thormbocytopenia >90,000. Not anemic, recent iron stores adequate. Contrasted CT AP 05/29/17 without liver lesion, and pancreas described as normal, though upper abdominal retroperitoneal edema noted possibly portal hypertension related though mild pancreatitis could be contributing: slight elevation in AFP and Ca19.9 nonspecific: referral to hepatology/GI placed. Will simply RTC in 6 months w labs, [place].     PLAN:  - As above  - Counseled on ETOH abuse  - Refer to hepatology (indication liver disease/cirrhosis, hep C infection)  - Advised to establish w primary care for routine health care needs  - Recommended GI f/u.     RTC in 6 months w labs, iron panel, AFP, Ca19.9, and RUQ [place] r/o liver lesions    I spent over 26 minutes with the patient out of which more than half were spent in face to face counseling.    Letha Cape, MD  [facility]  [facility]  [address] [place] [address]  Office : [phone]  Fax : [phone]

## 2017-07-10 HISTORY — PX: IR PARACENTESIS: IMG2679

## 2017-07-19 ENCOUNTER — Inpatient Hospital Stay (HOSPITAL_COMMUNITY)
Admission: EM | Admit: 2017-07-19 | Discharge: 2017-07-28 | DRG: 432 | Disposition: A | Discharge status: Home / Self Care | Payer: Medicaid Other | Attending: Internal Medicine | Admitting: Internal Medicine

## 2017-07-19 ENCOUNTER — Emergency Department (HOSPITAL_COMMUNITY): Payer: Self-pay

## 2017-07-19 DIAGNOSIS — K7031 Alcoholic cirrhosis of liver with ascites: Secondary | ICD-10-CM

## 2017-07-19 DIAGNOSIS — R140 Abdominal distension (gaseous): Secondary | ICD-10-CM

## 2017-07-19 DIAGNOSIS — R188 Other ascites: Secondary | ICD-10-CM

## 2017-07-19 DIAGNOSIS — K760 Fatty (change of) liver, not elsewhere classified: Secondary | ICD-10-CM | POA: Diagnosis present

## 2017-07-19 DIAGNOSIS — G40909 Epilepsy, unspecified, not intractable, without status epilepticus: Secondary | ICD-10-CM | POA: Diagnosis present

## 2017-07-19 DIAGNOSIS — F1010 Alcohol abuse, uncomplicated: Secondary | ICD-10-CM | POA: Diagnosis present

## 2017-07-19 DIAGNOSIS — K7460 Unspecified cirrhosis of liver: Secondary | ICD-10-CM | POA: Diagnosis present

## 2017-07-19 DIAGNOSIS — G9341 Metabolic encephalopathy: Secondary | ICD-10-CM | POA: Diagnosis present

## 2017-07-19 DIAGNOSIS — Z800 Family history of malignant neoplasm of digestive organs: Secondary | ICD-10-CM

## 2017-07-19 DIAGNOSIS — F319 Bipolar disorder, unspecified: Secondary | ICD-10-CM | POA: Diagnosis present

## 2017-07-19 DIAGNOSIS — Z9851 Tubal ligation status: Secondary | ICD-10-CM

## 2017-07-19 DIAGNOSIS — B182 Chronic viral hepatitis C: Secondary | ICD-10-CM | POA: Diagnosis present

## 2017-07-19 DIAGNOSIS — E876 Hypokalemia: Secondary | ICD-10-CM | POA: Diagnosis present

## 2017-07-19 DIAGNOSIS — K652 Spontaneous bacterial peritonitis: Secondary | ICD-10-CM | POA: Diagnosis not present

## 2017-07-19 DIAGNOSIS — R109 Unspecified abdominal pain: Secondary | ICD-10-CM

## 2017-07-19 DIAGNOSIS — R17 Unspecified jaundice: Secondary | ICD-10-CM | POA: Diagnosis present

## 2017-07-19 DIAGNOSIS — D61818 Other pancytopenia: Secondary | ICD-10-CM | POA: Diagnosis present

## 2017-07-19 DIAGNOSIS — K219 Gastro-esophageal reflux disease without esophagitis: Secondary | ICD-10-CM | POA: Diagnosis present

## 2017-07-19 DIAGNOSIS — K5900 Constipation, unspecified: Secondary | ICD-10-CM | POA: Diagnosis present

## 2017-07-19 DIAGNOSIS — Z590 Homelessness: Secondary | ICD-10-CM

## 2017-07-19 DIAGNOSIS — F10231 Alcohol dependence with withdrawal delirium: Secondary | ICD-10-CM | POA: Diagnosis present

## 2017-07-19 DIAGNOSIS — E871 Hypo-osmolality and hyponatremia: Secondary | ICD-10-CM | POA: Diagnosis not present

## 2017-07-19 DIAGNOSIS — K7290 Hepatic failure, unspecified without coma: Secondary | ICD-10-CM | POA: Diagnosis present

## 2017-07-19 DIAGNOSIS — D696 Thrombocytopenia, unspecified: Secondary | ICD-10-CM

## 2017-07-19 DIAGNOSIS — D689 Coagulation defect, unspecified: Secondary | ICD-10-CM | POA: Diagnosis present

## 2017-07-19 DIAGNOSIS — R768 Other specified abnormal immunological findings in serum: Secondary | ICD-10-CM | POA: Diagnosis present

## 2017-07-19 DIAGNOSIS — K7011 Alcoholic hepatitis with ascites: Secondary | ICD-10-CM | POA: Diagnosis present

## 2017-07-19 HISTORY — DX: Anxiety disorder, unspecified: F41.9

## 2017-07-19 HISTORY — DX: Unspecified convulsions: R56.9

## 2017-07-19 HISTORY — DX: Personal history of urinary calculi: Z87.442

## 2017-07-19 LAB — BODY FLUID CELL COUNT WITH DIFFERENTIAL
Eos, Fluid: 1 %
Lymphs, Fluid: 11 %
Monocyte-Macrophage-Serous Fluid: 87 % (ref 50–90)
Neutrophil Count, Fluid: 1 % (ref 0–25)
Total Nucleated Cell Count, Fluid: 148 cu mm (ref 0–1000)

## 2017-07-19 LAB — COMPREHENSIVE METABOLIC PANEL
ALT: 22 U/L (ref 14–54)
AST: 103 U/L — ABNORMAL HIGH (ref 15–41)
Albumin: 2.2 g/dL — ABNORMAL LOW (ref 3.5–5.0)
Alkaline Phosphatase: 204 U/L — ABNORMAL HIGH (ref 38–126)
Anion gap: 11 (ref 5–15)
BUN: <5 mg/dL — ABNORMAL LOW (ref 6–20)
CO2: 23 mmol/L (ref 22–32)
Calcium: 7.8 mg/dL — ABNORMAL LOW (ref 8.9–10.3)
Chloride: 94 mmol/L — ABNORMAL LOW (ref 101–111)
Creatinine, Ser: 0.74 mg/dL (ref 0.44–1.00)
GFR calc Af Amer: >60 mL/min (ref >60)
GFR calc non Af Amer: >60 mL/min (ref >60)
Glucose, Bld: 103 mg/dL — ABNORMAL HIGH (ref 65–99)
Potassium: 3.4 mmol/L — ABNORMAL LOW (ref 3.5–5.1)
Sodium: 128 mmol/L — ABNORMAL LOW (ref 135–145)
Total Bilirubin: 10.1 mg/dL — ABNORMAL HIGH (ref 0.3–1.2)
Total Protein: 8.1 g/dL (ref 6.5–8.1)

## 2017-07-19 LAB — URINALYSIS, ROUTINE W REFLEX MICROSCOPIC
Bacteria, UA: NONE SEEN
Glucose, UA: 50 mg/dL — AB
Ketones, ur: 5 mg/dL — AB
Leukocytes, UA: NEGATIVE
Nitrite: NEGATIVE
Protein, ur: 30 mg/dL — AB
Specific Gravity, Urine: 1.017 (ref 1.005–1.030)
pH: 5 (ref 5.0–8.0)

## 2017-07-19 LAB — PREGNANCY, URINE: Preg Test, Ur: NEGATIVE

## 2017-07-19 LAB — CBC WITH DIFFERENTIAL/PLATELET
Band Neutrophils: 0 %
Basophils Absolute: 0 10*3/uL (ref 0.0–0.1)
Basophils Relative: 0 %
Blasts: 0 %
Eosinophils Absolute: 0.1 10*3/uL (ref 0.0–0.7)
Eosinophils Relative: 1 %
HCT: 33.6 % — ABNORMAL LOW (ref 36.0–46.0)
Hemoglobin: 11.7 g/dL — ABNORMAL LOW (ref 12.0–15.0)
Lymphocytes Relative: 17 %
Lymphs Abs: 1 10*3/uL (ref 0.7–4.0)
MCH: 32.6 pg (ref 26.0–34.0)
MCHC: 34.8 g/dL (ref 30.0–36.0)
MCV: 93.6 fL (ref 78.0–100.0)
Metamyelocytes Relative: 0 %
Monocytes Absolute: 0.4 10*3/uL (ref 0.1–1.0)
Monocytes Relative: 8 %
Myelocytes: 0 %
Neutro Abs: 4.1 10*3/uL (ref 1.7–7.7)
Neutrophils Relative %: 74 %
Other: 0 %
Platelets: DECREASED 10*3/uL (ref 150–400)
Promyelocytes Absolute: 0 %
RBC: 3.59 MIL/uL — ABNORMAL LOW (ref 3.87–5.11)
RDW: 20.3 % — ABNORMAL HIGH (ref 11.5–15.5)
WBC: 5.6 10*3/uL (ref 4.0–10.5)
nRBC: 0 /100 WBC

## 2017-07-19 LAB — PROTIME-INR
INR: 1.63
Prothrombin Time: 19.2 seconds — ABNORMAL HIGH (ref 11.4–15.2)

## 2017-07-19 LAB — ALBUMIN, PLEURAL OR PERITONEAL FLUID: Albumin, Fluid: <1 g/dL

## 2017-07-19 LAB — GLUCOSE, PLEURAL OR PERITONEAL FLUID: Glucose, Fluid: 103 mg/dL

## 2017-07-19 LAB — BILIRUBIN, DIRECT: Bilirubin, Direct: 5.6 mg/dL — ABNORMAL HIGH (ref 0.1–0.5)

## 2017-07-19 LAB — AMMONIA: Ammonia: 39 umol/L — ABNORMAL HIGH (ref 9–35)

## 2017-07-19 LAB — LACTATE DEHYDROGENASE, PLEURAL OR PERITONEAL FLUID: LD, Fluid: 27 U/L — ABNORMAL HIGH (ref 3–23)

## 2017-07-19 LAB — LACTIC ACID, PLASMA: Lactic Acid, Venous: 1 mmol/L (ref 0.5–1.9)

## 2017-07-19 LAB — PLATELET COUNT: Platelets: DECREASED 10*3/uL (ref 150–400)

## 2017-07-19 LAB — APTT: aPTT: 40 seconds — ABNORMAL HIGH (ref 24–36)

## 2017-07-19 LAB — ETHANOL: Alcohol, Ethyl (B): <10 mg/dL (ref <10)

## 2017-07-19 LAB — PROTEIN, PLEURAL OR PERITONEAL FLUID: Total protein, fluid: <3 g/dL

## 2017-07-19 MED ORDER — ADULT MULTIVITAMIN W/MINERALS CH
1.0000 | ORAL_TABLET | Freq: Every day | ORAL | Status: DC
  Administered 2017-07-20 – 2017-07-21 (×3): 1 via ORAL
  Filled 2017-07-19 (×3): qty 1

## 2017-07-19 MED ORDER — ADULT MULTIVITAMIN W/MINERALS CH: 1.0000 | ORAL_TABLET | Freq: Every day | ORAL | Status: DC

## 2017-07-19 MED ORDER — DEXTROSE 5 % IV SOLN: 1.0000 g | INTRAVENOUS | Status: DC

## 2017-07-19 MED ORDER — HYDROMORPHONE HCL 1 MG/ML IJ SOLN
1.0000 mg | Freq: Once | INTRAMUSCULAR | Status: AC
  Administered 2017-07-19: 1 mg via INTRAVENOUS
  Filled 2017-07-19: qty 1

## 2017-07-19 MED ORDER — LORAZEPAM 2 MG/ML IJ SOLN
1.0000 mg | Freq: Four times a day (QID) | INTRAMUSCULAR | Status: DC | PRN
  Administered 2017-07-20 – 2017-07-21 (×3): 1 mg via INTRAVENOUS
  Filled 2017-07-19 (×3): qty 1

## 2017-07-19 MED ORDER — PHYTONADIONE 5 MG PO TABS
5.0000 mg | ORAL_TABLET | Freq: Every day | ORAL | Status: DC
  Administered 2017-07-20 – 2017-07-21 (×2): 5 mg via ORAL
  Filled 2017-07-19 (×4): qty 1

## 2017-07-19 MED ORDER — LORAZEPAM 1 MG PO TABS
1.0000 mg | ORAL_TABLET | Freq: Four times a day (QID) | ORAL | Status: DC | PRN
  Filled 2017-07-19: qty 1

## 2017-07-19 MED ORDER — PHENYTOIN SODIUM EXTENDED 100 MG PO CAPS
300.0000 mg | ORAL_CAPSULE | Freq: Every day | ORAL | Status: DC
  Administered 2017-07-20 – 2017-07-27 (×8): 300 mg via ORAL
  Filled 2017-07-19 (×9): qty 3

## 2017-07-19 MED ORDER — FOLIC ACID 1 MG PO TABS: 1.0000 mg | ORAL_TABLET | Freq: Every day | ORAL | Status: DC

## 2017-07-19 MED ORDER — LIDOCAINE-EPINEPHRINE 1 %-1:100000 IJ SOLN
10.0000 mL | Freq: Once | INTRAMUSCULAR | Status: AC
  Administered 2017-07-19: 10 mL
  Filled 2017-07-19: qty 10

## 2017-07-19 MED ORDER — THIAMINE HCL 100 MG/ML IJ SOLN
100.0000 mg | Freq: Every day | INTRAMUSCULAR | Status: DC
  Filled 2017-07-19: qty 2

## 2017-07-19 MED ORDER — PANTOPRAZOLE SODIUM 40 MG IV SOLR
40.0000 mg | INTRAVENOUS | Status: DC
  Administered 2017-07-20 – 2017-07-21 (×2): 40 mg via INTRAVENOUS
  Filled 2017-07-19 (×2): qty 40

## 2017-07-19 MED ORDER — FOLIC ACID 1 MG PO TABS
1.0000 mg | ORAL_TABLET | Freq: Every day | ORAL | Status: DC
  Administered 2017-07-20 – 2017-07-28 (×10): 1 mg via ORAL
  Filled 2017-07-19 (×10): qty 1

## 2017-07-19 MED ORDER — ALBUMIN HUMAN 25 % IV SOLN
75.0000 g | Freq: Once | INTRAVENOUS | Status: DC
  Filled 2017-07-19 (×2): qty 300

## 2017-07-19 MED ORDER — FUROSEMIDE 10 MG/ML IJ SOLN
20.0000 mg | Freq: Once | INTRAMUSCULAR | Status: AC
  Administered 2017-07-20: 20 mg via INTRAVENOUS
  Filled 2017-07-19: qty 2

## 2017-07-19 MED ORDER — TOPIRAMATE 25 MG PO TABS
25.0000 mg | ORAL_TABLET | Freq: Two times a day (BID) | ORAL | Status: DC
  Administered 2017-07-20 – 2017-07-28 (×18): 25 mg via ORAL
  Filled 2017-07-19 (×19): qty 1

## 2017-07-19 MED ORDER — SODIUM CHLORIDE 0.9 % IV SOLN
INTRAVENOUS | Status: DC
  Administered 2017-07-19 – 2017-07-20 (×2): via INTRAVENOUS
  Administered 2017-07-20: 250 mL/h via INTRAVENOUS
  Administered 2017-07-20: 05:00:00 via INTRAVENOUS

## 2017-07-19 MED ORDER — BUSPIRONE HCL 15 MG PO TABS
15.0000 mg | ORAL_TABLET | Freq: Three times a day (TID) | ORAL | Status: DC
  Administered 2017-07-20 – 2017-07-28 (×26): 15 mg via ORAL
  Filled 2017-07-19 (×3): qty 1
  Filled 2017-07-19: qty 3
  Filled 2017-07-19 (×8): qty 1
  Filled 2017-07-19: qty 3
  Filled 2017-07-19 (×16): qty 1

## 2017-07-19 MED ORDER — ALBUMIN HUMAN 5 % IV SOLN
75.0000 g | Freq: Once | INTRAVENOUS | Status: DC
  Filled 2017-07-19: qty 1500

## 2017-07-19 MED ORDER — VITAMIN B-1 100 MG PO TABS
100.0000 mg | ORAL_TABLET | Freq: Every day | ORAL | Status: DC
  Administered 2017-07-20 – 2017-07-28 (×10): 100 mg via ORAL
  Filled 2017-07-19 (×10): qty 1

## 2017-07-19 MED ORDER — DEXTROSE 5 % IV SOLN
1.0000 g | Freq: Once | INTRAVENOUS | Status: AC
  Administered 2017-07-19: 1 g via INTRAVENOUS
  Filled 2017-07-19: qty 10

## 2017-07-19 MED ORDER — VITAMIN B-1 100 MG PO TABS: 100.0000 mg | ORAL_TABLET | Freq: Every day | ORAL | Status: DC

## 2017-07-19 NOTE — ED Notes (Signed)
Attempted report 

## 2017-07-19 NOTE — ED Notes (Signed)
Pt. Requesting pain medication at 18:15.

## 2017-07-19 NOTE — ED Provider Notes (Signed)
[facility] EMERGENCY DEPARTMENT Provider Note   CSN: [id number] Arrival date & time: 07/19/17  1600     History   Chief Complaint Chief Complaint  Patient presents with  . Abdominal Pain    HPI Briana Sweeney is a 46 y.o. female.  HPI  The patient is a 46 year old female, the patient states that she has a history of hepatitis C as well as alcoholism and cirrhosis of the liver.  She reports that she last had alcohol 2 days ago.  She has had progressive abdominal pain and was seen in the emergency department at an outside facility 2 days ago during which time she reports that her lab work showed a platelet count of 7000, she was told to follow-up with gastroenterology at that time.  She states that her pain is worsened, her abdominal distention has worsened, she has become progressively short of breath stating that she cannot take a deep breath and feels like her lungs are compressed.  She has had subjective fevers and chills and has mild swelling of the lower extremities.  There is no nausea vomiting or diarrhea.  The symptoms are persistent, gradually worsening and have now become severe.  She denies any prior history of paracentesis in the past.  She states that she has recently moved from [place] to [facility] where she currently resides.  She has been here for approximately 1-1/2 weeks  No past medical history on file.  There are no active problems to display for this patient.   No past surgical history on file.  OB History    No data available       Home Medications    Prior to Admission medications   Not on File    Family History No family history on file.  Social History Social History   Tobacco Use  . Smoking status: Not on file  Substance Use Topics  . Alcohol use: Not on file  . Drug use: Not on file     Allergies   Patient has no allergy information on record.   Review of Systems Review of Systems  All other systems  reviewed and are negative.    Physical Exam Updated Vital Signs Ht 5' (1.524 m)   Wt 59 kg (130 lb)   BMI 25.39 kg/m   Physical Exam  Constitutional: She appears well-developed and well-nourished. She appears distressed.  HENT:  Head: Normocephalic and atraumatic.  Mouth/Throat: Oropharynx is clear and moist. No oropharyngeal exudate.  Eyes: Conjunctivae and EOM are normal. Pupils are equal, round, and reactive to light. Right eye exhibits no discharge. Left eye exhibits no discharge. Scleral icterus is present.  Neck: Normal range of motion. Neck supple. No JVD present. No thyromegaly present.  Cardiovascular: Regular rhythm, normal heart sounds and intact distal pulses. Exam reveals no gallop and no friction rub.  No murmur heard. Tachycardia present  Pulmonary/Chest: Breath sounds normal. No respiratory distress. She has no wheezes. She has no rales.  Increased work of breathing, tachypnea, shallow breathing but normal lung sounds  Abdominal: Bowel sounds are normal. She exhibits distension, fluid wave and ascites. She exhibits no mass. There is tenderness.  Diffuse abdominal tenderness with guarding  Musculoskeletal: Normal range of motion. She exhibits edema. She exhibits no tenderness.  Lymphadenopathy:    She has no cervical adenopathy.  Neurological: She is alert. Coordination normal.  Skin: Skin is warm and dry. No rash noted. No erythema.  Psychiatric: She has a normal mood and  affect. Her behavior is normal.  Nursing note and vitals reviewed.    ED Treatments / Results  Labs (all labs ordered are listed, but only abnormal results are displayed) Labs Reviewed  BODY FLUID CULTURE  PROTIME-INR  APTT  CBC WITH DIFFERENTIAL/PLATELET  COMPREHENSIVE METABOLIC PANEL  AMMONIA  ETHANOL  LACTATE DEHYDROGENASE, PLEURAL OR PERITONEAL FLUID  GLUCOSE, PLEURAL OR PERITONEAL FLUID  PROTEIN, PLEURAL OR PERITONEAL FLUID  ALBUMIN, PLEURAL OR PERITONEAL FLUID  BODY FLUID CELL  COUNT WITH DIFFERENTIAL    EKG  EKG Interpretation None       Radiology No results found.  Procedures .Paracentesis Date/Time: 07/19/2017 7:57 PM Performed by: Noemi Chapel, MD Authorized by: Noemi Chapel, MD   Consent:    Consent obtained:  Written   Consent given by:  Patient   Risks discussed:  Bleeding, bowel perforation, infection and pain Pre-procedure details:    Procedure purpose:  Diagnostic   Preparation: Patient was prepped and draped in usual sterile fashion   Anesthesia (see MAR for exact dosages):    Anesthesia method:  Local infiltration   Local anesthetic:  Lidocaine 1% WITH epi Procedure details:    Needle gauge:  18   Ultrasound guidance: yes     Puncture site:  R lower quadrant   Fluid removed amount:  25cc   Fluid appearance:  Amber and clear   Dressing:  4x4 sterile gauze and adhesive bandage Post-procedure details:    Patient tolerance of procedure:  Tolerated well, no immediate complications Comments:     The patient was evaluated with ultrasound prior to the procedure and it appeared that the right lower quadrant had better access and larger fluid collection than the left lower quadrant.  The procedure went well, the catheter was unable to be threaded successfully and thus the catheter was aborted and only an aspiration of 25 cc of fluid was done.  The patient had a sterile bandage applied after the procedure.   (including critical care time)  Angiocath insertion Performed by: Johnna Acosta  Consent: Verbal consent obtained. Risks and benefits: risks, benefits and alternatives were discussed Time out: Immediately prior to procedure a "time out" was called to verify the correct patient, procedure, equipment, support staff and site/side marked as required.  Preparation: Patient was prepped and draped in the usual sterile fashion.  Vein Location: L distal Upper extremity proximal to the [facility]  Ultrasound Guided  Gauge: 20   Normal blood  return and flush without difficulty Patient tolerance: Patient tolerated the procedure well with no immediate complications.     Medications Ordered in ED Medications  0.9 %  sodium chloride infusion (not administered)     Initial Impression / Assessment and Plan / ED Course  I have reviewed the triage vital signs and the nursing notes.  Pertinent labs & imaging results that were available during my care of the patient were reviewed by me and considered in my medical decision making (see chart for details).     Has worsening ascites Has increased abd pain - must r/o peritonitis May just be fluid overload but with tachycardia could also be related to alcohol withdrawal with 48 hours off of alcohol, would also consider that she has intra-abdominal process causing peritonitis other than spontaneous bacterial peritonitis.  The patient will need some IV fluids, she will need paracentesis, she will need some Ativan for withdrawal.  She is extremely jaundiced and I suspect she has a bilirubin upwards of 10, she is tachycardic, short  of breath, ill-appearing and will need to be admitted to the hospital.  White blood cell count was normal, slight anemia, platelets seem low.  Conference of metabolic panel confirms suspicion of hyperbilirubinemia and transaminitis.  The patient remained tachycardic, chest x-ray was ordered, fluids were given, pain medications given and consultation with hospitalist was performed.  The patient will be admitted to the hospital with presumed spontaneous bacterial peritonitis.  Rocephin ordered for infection.  Final Clinical Impressions(s) / ED Diagnoses   Final diagnoses:  SBP (spontaneous bacterial peritonitis) ([place])  Ascites due to alcoholic cirrhosis ([place])  Hyponatremia  Thrombocytopenia [facility])    ED Discharge Orders    None       Noemi Chapel, MD 07/19/17 2002

## 2017-07-19 NOTE — ED Notes (Signed)
Admitting MD at bedside.

## 2017-07-19 NOTE — H&P (Addendum)
History and Physical   Briana Sweeney GGY:[id number] DOB: April 01, 1971 DOA: 07/19/2017  PCP: Patient, No Pcp Per  Chief Complaint: Shortness of breath  HPI: This 46 year old woman with alcohol abuse, hepatitis C that is untreated, cirrhosis, and history of seizures presenting with a two-day history of abdominal swelling and shortness of breath. She reports relocating to the area about 2 weeks ago, formerly lived in [place], [place].  She reports having history of alcohol withdrawal, denies ever being intubated. She denies ever having hematemesis, hepatic encephalopathy, or bleeding per rectum. She denies ever having an endoscopic evaluation. No history of ever getting a paracentesis. She does report formerly having some degree of fluid on her abdomen, but never to this extent. She reports shortness of breath associated with the abdominal fullness, does report in the past she has had low platelets requiring transfusion for ecchymoses. Timing of last platelet transfusion was over 6 months ago per her report. She denies having any hematemesis, hemoptysis, melena, bright red blood per rectum, fevers or chills. She does report nausea and dry heaving, some dizziness. She was taking NSAIDs at home.  No aggravating or alleviating factors to her abdominal discomfort/swelling. She's never had abdominal fullness to this degree before. Over the past year she reports being admitted to [facility] 3 times for various ailments, she is unable to tell me exactly which.  She reports last drink was 48 hours before admission, drinks vodka daily along with beer. Denies smoking. She also has noticed that her eyes have become discolored, turning yellow.  ED Course: In the emergency department vital signs remarkable for tachycardia to ~115, blood pressure with systolics in the 035K, respiratory elevated between 24-28, breathing room air. Diagnostics obtained remarkable for sodium of 128, total bilirubin of 10.1, albumin of  2.3, AST of 103, ALP of 22, alkaline phosphatase of 204, PT/INR of 1.63, hemoglobin 11.7, platelet count unable to be determined due to platelet clumping. Alcohol level undetectable.  In the emergency part due to frank ascites, diagnostic paracentesis obtained with 20-25 mL of fluid. Ceftriaxone given. Hospital medicine team consulted for admission.  Review of Systems: A complete ROS was obtained; pertinent positives negatives are denoted in the HPI. Otherwise, all systems are negative.   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 Father alive at 60, has a pacemaker.  Social hx: Reports having recently moved to the area in [place], [place] with an older friend.  She denies smoking, drinks alcohol typically on a daily basis probably vodka. She does not work, she reports being on Social Security related to her cirrhosis and seizure disorder.  Physical Exam: Vitals:   07/19/17 1745 07/19/17 1800 07/19/17 1815 07/19/17 1830  BP: 109/86 113/80 111/85 115/83  Pulse: (!) 109 (!) 111 (!) 111 (!) 113  Resp:  15 15 14   SpO2: 99% 91% 94% 97%  Weight:      Height:       General: Mild distress, white woman, anxious appearing, scleral icterus present. ENT: Grossly normal hearing, MMM. Cardiovascular: Tachycardic ~110. No M/R/G. No LE edema.  Respiratory: CTA bilaterally. No wheezes or crackles. RR increased to ~25 Speaking in complete sentences without difficulty Abdomen: Distended abdomen with caput medusae changes, pain upon palpation of abdomen Skin: jaundice present Musculoskeletal: Grossly normal tone BUE/BLE. Appropriate  ROM.  Psychiatric: Grossly normal mood and affect.   Neurologic: Moves all extremities in coordinated fashion. No asterixis.  Answers  questions appropriately.  I have personally reviewed the following labs, culture data, and imaging studies.  Assessment/Plan:  Acute decompensated cirrhosis 2/2 to ETOH and HCV Sepsis secondary to spontaneous bacterial peritonitis, possible Ascites Alcoholic hepatitis Alcohol abuse Woman with self reported cirrhosis related to HCV and ETOH presenting with dyspnea related to abdominal ascites; clinical picture consistent with sepsis (tachycardic, RR > 22 on initial presentation) with source being SBP (tender to palpation) with ETOH related hepatitis (jaundice, hyperbilirubinemia to 10, elevated AST). S/P diagnostic paracentesis in the emergency department. MELD score of 21. PLAN: -will continue ceftriaxone for empiric treatment of SBP, will also dose x 1 with 1.5 g/kg of albumin given her hyperbilirubinemia, if the clinical picture remains consistent with SBP after initial diagnostics, recommend re-dosing on day #3 -abdominal US in AM to evaluate degree of ascites and to evaluate liver / spleen, consider trial of therapeutic paracentesis as well -follow up paracentesis studies -benzodiazepines for ETOH withdrawal as needed -folic acid + vitamins for any degree of malnutrition -holding on steroids for alcoholic hepatitis given concern for concomitant infection -consider GI consultation for recommendations on further management -hepatitis panel ordered, consider HCV quant and genotype pending hepatitis screen results (pt reports receiving HAV and HBV vaccination) -consider diuresis in the AM if hemodynamic / clinical stability persists  Other problems -Hyponatremia: likely related to volume overload in setting of decompensated cirrhosis; consider diuresis as mentioned above -Coagulopathy of liver disease: giving 3 doses of PO vitamin K in event underlying vitamin K deficiency, trending with AM labs -Platelet clumping / thrombocytopenia: re-draw in citrate containing tube for accurate platelet  count, no active bleeding on admission -Hx of seizures: continue home AEDs -GI PPX: PPI IV daily for now  DVT prophylaxis: SCDs given elevated INR and unknown platelet count at time of admission Code Status: full code Disposition Plan: Anticipate D/C home in 2-5 days Consults called: none, but consider GI consult, as well as engagement of psychosocial support for ETOH cessation counseling / community resources Admission status: admit to inpatient hospital medicine team   Cheri Rous, MD [facility] [phone]  If 7PM-7AM, please contact night-coverage [email or web address] Password [email or web address]  **Addendum: Reviewed paracentesis results, not consistent with SBP; therefore, likely that her increased RR and tachycardia are 2/2 to ascites, will trial with furosemide 20 mg IV x 1 and assess response. 11:32 PM 07/19/17 Vilma Prader

## 2017-07-19 NOTE — ED Notes (Signed)
Consent signed by pt. For Paracentesis after procedure explained to pt by Dr. Hazle Nordmann

## 2017-07-19 NOTE — ED Triage Notes (Signed)
Per EMS Pt complains of abdominal pain x3days with pain 10/10 has history of cirrhosis of the liver.  She has a visible distended abdomen and color changed suspect for jaundice.  AOx4 NAD noted at this time.

## 2017-07-20 ENCOUNTER — Inpatient Hospital Stay (HOSPITAL_COMMUNITY): Payer: Self-pay

## 2017-07-20 ENCOUNTER — Other Ambulatory Visit: Payer: Self-pay

## 2017-07-20 DIAGNOSIS — K7031 Alcoholic cirrhosis of liver with ascites: Principal | ICD-10-CM

## 2017-07-20 LAB — CBC WITH DIFFERENTIAL/PLATELET
Basophils Absolute: 0 10*3/uL (ref 0.0–0.1)
Basophils Relative: 0 %
Eosinophils Absolute: 0.1 10*3/uL (ref 0.0–0.7)
Eosinophils Relative: 3 %
HCT: 31.6 % — ABNORMAL LOW (ref 36.0–46.0)
Hemoglobin: 11.2 g/dL — ABNORMAL LOW (ref 12.0–15.0)
Lymphocytes Relative: 27 %
Lymphs Abs: 1.3 10*3/uL (ref 0.7–4.0)
MCH: 33.3 pg (ref 26.0–34.0)
MCHC: 35.4 g/dL (ref 30.0–36.0)
MCV: 94 fL (ref 78.0–100.0)
Monocytes Absolute: 0.6 10*3/uL (ref 0.1–1.0)
Monocytes Relative: 13 %
Neutro Abs: 2.8 10*3/uL (ref 1.7–7.7)
Neutrophils Relative %: 57 %
Platelets: 48 10*3/uL — ABNORMAL LOW (ref 150–400)
RBC: 3.36 MIL/uL — ABNORMAL LOW (ref 3.87–5.11)
RDW: 21.1 % — ABNORMAL HIGH (ref 11.5–15.5)
WBC: 4.8 10*3/uL (ref 4.0–10.5)

## 2017-07-20 LAB — COMPREHENSIVE METABOLIC PANEL
ALT: 18 U/L (ref 14–54)
AST: 85 U/L — ABNORMAL HIGH (ref 15–41)
Albumin: 2 g/dL — ABNORMAL LOW (ref 3.5–5.0)
Alkaline Phosphatase: 182 U/L — ABNORMAL HIGH (ref 38–126)
Anion gap: 9 (ref 5–15)
BUN: <5 mg/dL — ABNORMAL LOW (ref 6–20)
CO2: 22 mmol/L (ref 22–32)
Calcium: 7.2 mg/dL — ABNORMAL LOW (ref 8.9–10.3)
Chloride: 98 mmol/L — ABNORMAL LOW (ref 101–111)
Creatinine, Ser: 0.63 mg/dL (ref 0.44–1.00)
GFR calc Af Amer: >60 mL/min (ref >60)
GFR calc non Af Amer: >60 mL/min (ref >60)
Glucose, Bld: 102 mg/dL — ABNORMAL HIGH (ref 65–99)
Potassium: 2.8 mmol/L — ABNORMAL LOW (ref 3.5–5.1)
Sodium: 129 mmol/L — ABNORMAL LOW (ref 135–145)
Total Bilirubin: 9.4 mg/dL — ABNORMAL HIGH (ref 0.3–1.2)
Total Protein: 7.6 g/dL (ref 6.5–8.1)

## 2017-07-20 LAB — MAGNESIUM: Magnesium: 1.5 mg/dL — ABNORMAL LOW (ref 1.7–2.4)

## 2017-07-20 LAB — PROTIME-INR
INR: 1.68
Prothrombin Time: 19.7 seconds — ABNORMAL HIGH (ref 11.4–15.2)

## 2017-07-20 LAB — HIV ANTIBODY (ROUTINE TESTING W REFLEX): HIV Screen 4th Generation wRfx: NONREACTIVE

## 2017-07-20 MED ORDER — LIDOCAINE HCL (PF) 1 % IJ SOLN
INTRAMUSCULAR | Status: AC
  Filled 2017-07-20: qty 30

## 2017-07-20 MED ORDER — DIPHENHYDRAMINE HCL 25 MG PO CAPS
25.0000 mg | ORAL_CAPSULE | ORAL | Status: DC | PRN
  Administered 2017-07-20: 25 mg via ORAL
  Filled 2017-07-20: qty 1

## 2017-07-20 MED ORDER — HYOSCYAMINE SULFATE 0.125 MG PO TABS
0.2500 mg | ORAL_TABLET | ORAL | Status: DC | PRN
  Administered 2017-07-20: 0.25 mg via ORAL
  Filled 2017-07-20 (×2): qty 2

## 2017-07-20 MED ORDER — HYOSCYAMINE SULFATE 0.125 MG PO TBDP
0.2500 mg | ORAL_TABLET | ORAL | Status: AC | PRN
  Administered 2017-07-20: 0.25 mg via SUBLINGUAL
  Filled 2017-07-20: qty 2

## 2017-07-20 MED ORDER — OXYCODONE HCL 5 MG PO TABS
5.0000 mg | ORAL_TABLET | Freq: Four times a day (QID) | ORAL | Status: DC | PRN
  Administered 2017-07-20 – 2017-07-21 (×3): 5 mg via ORAL
  Filled 2017-07-20 (×3): qty 1

## 2017-07-20 MED ORDER — POTASSIUM CHLORIDE CRYS ER 20 MEQ PO TBCR
40.0000 meq | EXTENDED_RELEASE_TABLET | Freq: Two times a day (BID) | ORAL | Status: AC
  Administered 2017-07-20 (×2): 40 meq via ORAL
  Filled 2017-07-20 (×2): qty 2

## 2017-07-20 MED ORDER — ALBUMIN HUMAN 25 % IV SOLN
50.0000 g | Freq: Once | INTRAVENOUS | Status: AC
  Administered 2017-07-20: 50 g via INTRAVENOUS
  Filled 2017-07-20: qty 200

## 2017-07-20 MED ORDER — MAGNESIUM SULFATE 2 GM/50ML IV SOLN
2.0000 g | Freq: Once | INTRAVENOUS | Status: AC
  Administered 2017-07-20: 2 g via INTRAVENOUS
  Filled 2017-07-20: qty 50

## 2017-07-20 MED ORDER — MORPHINE SULFATE (PF) 2 MG/ML IV SOLN
1.0000 mg | Freq: Four times a day (QID) | INTRAVENOUS | Status: DC | PRN
  Administered 2017-07-20: 1 mg via INTRAVENOUS
  Filled 2017-07-20: qty 1

## 2017-07-20 NOTE — Progress Notes (Signed)
New Admission Note:  Arrival Method: Via stretcher from ED. Mental Orientation: Alert & Oriented x4 Telemetry: CCMD verified.  Assessment: Completed Skin: Refer to flowsheet IV: Left Upper Arm Pain: 5/10 Tubes: None Safety Measures: Safety Fall Prevention Plan discussed with patient. Admission: Completed 2 [place] Orientation: Patient has been orientated to the room, unit and the staff.  Orders have been reviewed and implemented. Will continue to monitor the patient. Call light has been placed within reach and bed alarm has been activated.   Vassie Moselle, RN  Phone Number: [phone]

## 2017-07-20 NOTE — Progress Notes (Signed)
Critical Labs Potassium-2.8 Platelets-48 MD made aware. Awaiting orders.

## 2017-07-20 NOTE — Procedures (Signed)
Ultrasound-guided therapeutic paracentesis performed yielding 1 liters of serous colored fluid. No immediate complications.  Briana Sweeney E 12:43 PM 07/20/2017

## 2017-07-20 NOTE — Progress Notes (Signed)
Return call received from MD re:  K+ of 2.8.  Magnesium level ordered.  Complaining of sore throat and abdominal cramping.  Transporter present to take to U/S for paracenthesis.

## 2017-07-20 NOTE — Progress Notes (Signed)
PROGRESS NOTE    Briana Sweeney  MOQ:[id number] DOB: Nov 19, 1970 DOA: 07/19/2017 PCP: Patient, No Pcp Per   Specialists:     Brief Narrative:  46 year old female Known history of EtOH Hep C untreated Seizures  Labs on admission showed sodium 129 potassium 2.8 BUN/creatinine 5/0.6 Magnesium 1.5 alk phos 182 AST/ALT 85/18, total bilirubin 9.4 Platelet 48  Assessment & Plan:   Active Problems:   SBP (spontaneous bacterial peritonitis) (HCC)  Unlikely SBP in setting of 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, EtOH-meld score on admission 21 Underwent diagnostic paracentesis rule out SBP, placed on ceftriaxone which has been discontinued as she only has 11 lymphocytes and her She is having severe abdominal pain probably from the swelling and we will repeat and do a therapeutic paracentesis and hope that she does not reaccumulate-I have added oxycodone 5 mg every 6 as needed and patient may have limited amounts of IV morphine 1 mg every 6 as needed Once paracentesis is done with a.m. labs we will add Aldactone to Lasix and 50-20 ratio We will obtain assistance from gastroenterology non-emergently  Hepatitis C reported Will need quantitative values which have been ordered with a.m.  Coagulopathy platelets in the 40s  secondary to cirrhosis is a 9 AM-given 3 doses of vitamin K 5 mg recheck  Reflux continue Protonix 40 daily  Seizure disorder probably alcoholic-continue Dilantin 300 Exa, Topamax 25 twice daily  Alcohol withdrawal placed on CIWA protocol so far not withdrawing Monitoring  No family present SCD Inpatient Await further workup   Consultants:   None yet  Procedures:   Diagnostic paracentesis  Antimicrobials:   Ceftriaxone 1 dose/   Subjective: In severe pain and abdomen overall feels swollen mild nausea as well as vitamins No chest pain No fever no chills has not eaten much  Objective: Vitals:   07/19/17 1830 07/19/17 2216 07/19/17 2300 07/20/17 0533  BP: 115/83   117/79 107/69  Pulse: (!) 113  100 (!) 115  Resp: '14  16 17  '$ Temp:  97.9 F (36.6 C) 98.6 F (37 C) 98.2 F (36.8 C)  TempSrc:  Oral Oral Oral  SpO2: 97%  97% 95%  Weight:   63.3 kg (139 lb 9.6 oz)   Height:        Intake/Output Summary (Last 24 hours) at 07/20/2017 0826 Last data filed at 07/20/2017 0601 Gross per 24 hour  Intake 2868.33 ml  Output 1550 ml  Net 1318.33 ml   Filed Weights   07/19/17 1607 07/19/17 2300  Weight: 59 kg (130 lb) 63.3 kg (139 lb 9.6 oz)    Examination:  Caucasian female looking about her stated age grossly icteric chest is clinically clear she is slightly tachycardic S1-S2 with no murmur no rales no rhonchi multiple tattoos over back Abdomen is tense and swollen No lower extremity edema Neurologically intact without any tremor   Data Reviewed: I have personally reviewed following labs and imaging studies  CBC: Recent Labs  Lab 07/19/17 1645 07/19/17 2220 07/20/17 0300  WBC 5.6  --  4.8  NEUTROABS 4.1  --  2.8  HGB 11.7*  --  11.2*  HCT 33.6*  --  31.6*  MCV 93.6  --  94.0  PLT PLATELET CLUMPS NOTED ON SMEAR, COUNT APPEARS DECREASED PLATELET CLUMPS NOTED ON SMEAR, COUNT APPEARS DECREASED 48*   Basic Metabolic Panel: Recent Labs  Lab 07/19/17 1645 07/20/17 0300  NA 128* 129*  K 3.4* 2.8*  CL 94* 98*  CO2 23 22  GLUCOSE 103*  102*  BUN <5* <5*  CREATININE 0.74 0.63  CALCIUM 7.8* 7.2*  MG  --  1.5*   GFR: Estimated Creatinine Clearance: 73.7 mL/min (by C-G formula based on SCr of 0.63 mg/dL). Liver Function Tests: Recent Labs  Lab 07/19/17 1645 07/20/17 0300  AST 103* 85*  ALT 22 18  ALKPHOS 204* 182*  BILITOT 10.1* 9.4*  PROT 8.1 7.6  ALBUMIN 2.2* 2.0*   No results for input(s): LIPASE, AMYLASE in the last 168 hours. Recent Labs  Lab 07/19/17 1812  AMMONIA 39*   Coagulation Profile: Recent Labs  Lab 07/19/17 1645 07/20/17 0300  INR 1.63 1.68   Cardiac Enzymes: No results for input(s): CKTOTAL, CKMB,  CKMBINDEX, TROPONINI in the last 168 hours. BNP (last 3 results) No results for input(s): PROBNP in the last 8760 hours. HbA1C: No results for input(s): HGBA1C in the last 72 hours. CBG: No results for input(s): GLUCAP in the last 168 hours. Lipid Profile: No results for input(s): CHOL, HDL, LDLCALC, TRIG, CHOLHDL, LDLDIRECT in the last 72 hours. Thyroid Function Tests: No results for input(s): TSH, T4TOTAL, FREET4, T3FREE, THYROIDAB in the last 72 hours. Anemia Panel: No results for input(s): VITAMINB12, FOLATE, FERRITIN, TIBC, IRON, RETICCTPCT in the last 72 hours. Urine analysis:    Component Value Date/Time   COLORURINE AMBER (A) 07/19/2017 1604   APPEARANCEUR CLOUDY (A) 07/19/2017 1604   LABSPEC 1.017 07/19/2017 1604   PHURINE 5.0 07/19/2017 1604   GLUCOSEU 50 (A) 07/19/2017 1604   HGBUR MODERATE (A) 07/19/2017 1604   BILIRUBINUR MODERATE (A) 07/19/2017 1604   KETONESUR 5 (A) 07/19/2017 1604   PROTEINUR 30 (A) 07/19/2017 1604   NITRITE NEGATIVE 07/19/2017 [place] 07/19/2017 1604     Radiology Studies: Reviewed images personally in health database    Scheduled Meds: . busPIRone  15 mg Oral TID  . folic acid  1 mg Oral Daily  . multivitamin with minerals  1 tablet Oral Daily  . pantoprazole (PROTONIX) IV  40 mg Intravenous Q24H  . phenytoin  300 mg Oral QHS  . phytonadione  5 mg Oral Daily  . potassium chloride SA  40 mEq Oral BID WC  . thiamine  100 mg Oral Daily   Or  . thiamine  100 mg Intravenous Daily  . topiramate  25 mg Oral BID   Continuous Infusions: . sodium chloride 250 mL/hr at 07/20/17 0430  . cefTRIAXone (ROCEPHIN)  IV       LOS: 1 day    Time spent: [place], MD [facility]   If 7PM-7AM, please contact night-coverage [email or web address] Password [email or web address] 07/20/2017, 8:26 AM

## 2017-07-21 DIAGNOSIS — K7031 Alcoholic cirrhosis of liver with ascites: Secondary | ICD-10-CM | POA: Diagnosis not present

## 2017-07-21 LAB — CBC
HCT: 30 % — ABNORMAL LOW (ref 36.0–46.0)
HCT: 32.3 % — ABNORMAL LOW (ref 36.0–46.0)
Hemoglobin: 10.1 g/dL — ABNORMAL LOW (ref 12.0–15.0)
Hemoglobin: 10.7 g/dL — ABNORMAL LOW (ref 12.0–15.0)
MCH: 33.2 pg (ref 26.0–34.0)
MCH: 32.7 pg (ref 26.0–34.0)
MCHC: 33.7 g/dL (ref 30.0–36.0)
MCHC: 33.1 g/dL (ref 30.0–36.0)
MCV: 98.7 fL (ref 78.0–100.0)
MCV: 98.8 fL (ref 78.0–100.0)
Platelets: DECREASED 10*3/uL (ref 150–400)
RBC: 3.04 MIL/uL — ABNORMAL LOW (ref 3.87–5.11)
RBC: 3.27 MIL/uL — ABNORMAL LOW (ref 3.87–5.11)
RDW: 22 % — ABNORMAL HIGH (ref 11.5–15.5)
RDW: 21.8 % — ABNORMAL HIGH (ref 11.5–15.5)
WBC: 2.8 10*3/uL — ABNORMAL LOW (ref 4.0–10.5)
WBC: 4.3 10*3/uL (ref 4.0–10.5)

## 2017-07-21 LAB — COMPREHENSIVE METABOLIC PANEL
ALT: 19 U/L (ref 14–54)
AST: 71 U/L — ABNORMAL HIGH (ref 15–41)
Albumin: 2.5 g/dL — ABNORMAL LOW (ref 3.5–5.0)
Alkaline Phosphatase: 145 U/L — ABNORMAL HIGH (ref 38–126)
Anion gap: 8 (ref 5–15)
BUN: <5 mg/dL — ABNORMAL LOW (ref 6–20)
CO2: 19 mmol/L — ABNORMAL LOW (ref 22–32)
Calcium: 7.6 mg/dL — ABNORMAL LOW (ref 8.9–10.3)
Chloride: 108 mmol/L (ref 101–111)
Creatinine, Ser: 0.59 mg/dL (ref 0.44–1.00)
GFR calc Af Amer: >60 mL/min (ref >60)
GFR calc non Af Amer: >60 mL/min (ref >60)
Glucose, Bld: 89 mg/dL (ref 65–99)
Potassium: 3.7 mmol/L (ref 3.5–5.1)
Sodium: 135 mmol/L (ref 135–145)
Total Bilirubin: 9.5 mg/dL — ABNORMAL HIGH (ref 0.3–1.2)
Total Protein: 7.6 g/dL (ref 6.5–8.1)

## 2017-07-21 LAB — PROTIME-INR
INR: 1.82
Prothrombin Time: 21 seconds — ABNORMAL HIGH (ref 11.4–15.2)

## 2017-07-21 LAB — HEPATITIS PANEL, ACUTE
HCV Ab: >11 s/co ratio — ABNORMAL HIGH (ref 0.0–0.9)
Hep A IgM: NEGATIVE
Hep B C IgM: NEGATIVE
Hepatitis B Surface Ag: NEGATIVE

## 2017-07-21 LAB — PATHOLOGIST SMEAR REVIEW

## 2017-07-21 MED ORDER — ADULT MULTIVITAMIN W/MINERALS CH
1.0000 | ORAL_TABLET | Freq: Every day | ORAL | Status: DC
  Administered 2017-07-22 – 2017-07-28 (×7): 1 via ORAL
  Filled 2017-07-21 (×7): qty 1

## 2017-07-21 MED ORDER — CHLORDIAZEPOXIDE HCL 25 MG PO CAPS
25.0000 mg | ORAL_CAPSULE | Freq: Three times a day (TID) | ORAL | Status: AC
  Administered 2017-07-22 – 2017-07-23 (×3): 25 mg via ORAL
  Filled 2017-07-21 (×3): qty 1

## 2017-07-21 MED ORDER — CHLORDIAZEPOXIDE HCL 25 MG PO CAPS
25.0000 mg | ORAL_CAPSULE | Freq: Every day | ORAL | Status: AC
  Administered 2017-07-24: 25 mg via ORAL
  Filled 2017-07-21: qty 1

## 2017-07-21 MED ORDER — PANTOPRAZOLE SODIUM 40 MG PO TBEC
40.0000 mg | DELAYED_RELEASE_TABLET | Freq: Every day | ORAL | Status: DC
  Administered 2017-07-21 – 2017-07-24 (×4): 40 mg via ORAL
  Filled 2017-07-21 (×4): qty 1

## 2017-07-21 MED ORDER — LOPERAMIDE HCL 2 MG PO CAPS: 2.0000 mg | ORAL_CAPSULE | ORAL | Status: AC | PRN

## 2017-07-21 MED ORDER — CHLORDIAZEPOXIDE HCL 25 MG PO CAPS
25.0000 mg | ORAL_CAPSULE | Freq: Four times a day (QID) | ORAL | Status: AC
  Administered 2017-07-21 – 2017-07-22 (×3): 25 mg via ORAL
  Filled 2017-07-21 (×3): qty 1

## 2017-07-21 MED ORDER — SPIRONOLACTONE 25 MG PO TABS
50.0000 mg | ORAL_TABLET | Freq: Every day | ORAL | Status: DC
  Administered 2017-07-21 – 2017-07-28 (×8): 50 mg via ORAL
  Filled 2017-07-21: qty 2
  Filled 2017-07-21: qty 1
  Filled 2017-07-21 (×6): qty 2

## 2017-07-21 MED ORDER — THIAMINE HCL 100 MG/ML IJ SOLN: 100.0000 mg | Freq: Once | INTRAMUSCULAR | Status: AC

## 2017-07-21 MED ORDER — FUROSEMIDE 20 MG PO TABS
20.0000 mg | ORAL_TABLET | Freq: Every day | ORAL | Status: DC
  Administered 2017-07-21 – 2017-07-28 (×8): 20 mg via ORAL
  Filled 2017-07-21 (×8): qty 1

## 2017-07-21 MED ORDER — LORAZEPAM 1 MG PO TABS: 1.0000 mg | ORAL_TABLET | ORAL | Status: DC | PRN

## 2017-07-21 MED ORDER — CHLORDIAZEPOXIDE HCL 25 MG PO CAPS: 25.0000 mg | ORAL_CAPSULE | Freq: Four times a day (QID) | ORAL | Status: AC | PRN

## 2017-07-21 MED ORDER — CHLORDIAZEPOXIDE HCL 5 MG PO CAPS
50.0000 mg | ORAL_CAPSULE | Freq: Once | ORAL | Status: AC
  Administered 2017-07-21: 50 mg via ORAL
  Filled 2017-07-21: qty 10

## 2017-07-21 MED ORDER — VITAMIN B-1 100 MG PO TABS: 100.0000 mg | ORAL_TABLET | Freq: Every day | ORAL | Status: DC

## 2017-07-21 MED ORDER — LORAZEPAM 2 MG/ML IJ SOLN
3.0000 mg | Freq: Once | INTRAMUSCULAR | Status: AC
  Administered 2017-07-21: 3 mg via INTRAVENOUS
  Filled 2017-07-21: qty 2

## 2017-07-21 MED ORDER — OXYCODONE HCL 5 MG PO TABS
5.0000 mg | ORAL_TABLET | Freq: Three times a day (TID) | ORAL | Status: DC | PRN
  Administered 2017-07-21: 5 mg via ORAL
  Filled 2017-07-21: qty 1

## 2017-07-21 MED ORDER — HYDROXYZINE HCL 25 MG PO TABS
25.0000 mg | ORAL_TABLET | Freq: Four times a day (QID) | ORAL | Status: AC | PRN
  Administered 2017-07-21 – 2017-07-23 (×4): 25 mg via ORAL
  Filled 2017-07-21 (×4): qty 1

## 2017-07-21 MED ORDER — CHLORDIAZEPOXIDE HCL 25 MG PO CAPS
25.0000 mg | ORAL_CAPSULE | ORAL | Status: AC
  Administered 2017-07-23 – 2017-07-24 (×2): 25 mg via ORAL
  Filled 2017-07-21 (×2): qty 1

## 2017-07-21 MED ORDER — CHLORDIAZEPOXIDE HCL 5 MG PO CAPS
25.0000 mg | ORAL_CAPSULE | Freq: Once | ORAL | Status: AC
  Administered 2017-07-21: 25 mg via ORAL
  Filled 2017-07-21: qty 5

## 2017-07-21 MED ORDER — ONDANSETRON 4 MG PO TBDP
4.0000 mg | ORAL_TABLET | Freq: Four times a day (QID) | ORAL | Status: AC | PRN
  Administered 2017-07-22: 4 mg via ORAL
  Filled 2017-07-21 (×2): qty 1

## 2017-07-21 MED ORDER — LORAZEPAM 2 MG/ML IJ SOLN
1.0000 mg | INTRAMUSCULAR | Status: DC | PRN
  Filled 2017-07-21: qty 1

## 2017-07-21 NOTE — Progress Notes (Signed)
Report given to Cybil, Therapist, sports. Pt transported by RN to 4E 17. Pt continues to have hallucinations. Pt not oriented to place or situation.

## 2017-07-21 NOTE — Consult Note (Addendum)
Referring Provider:  Dr. Verlon Au Primary Care Physician:  Patient, No Pcp Per Primary Gastroenterologist:  unassigned  Reason for Consultation:  Cirrhosis with ascites  HPI: Briana Sweeney is a 46 y.o. female With past medical history of hepatitis C, alcohol abuse, cirrhosis presented to the hospital with abdominal pain and shortness of breath.She recently moved from [place].upon initial evaluation in the ED, she was found to have a tachycardia and tachypnea along with Elevated LFTs and thrombocytopenia. Diagnostic paracentesis was performed. GI is consulted for further evaluation.  Patient currently sitting at the nursing desk. Discussed with the nursing staff. Patient refuses to stay in the bed. Patient is not able to give detailed history. According to her. She is in the   hospital because of her "low platelet counts"  and she is " taking medication for that".patient with a known liver disease and has recently moved to [place] 2 weeks ago from [place]. No records available to review. She denied any blood in the stool or black stool.   PMH : History of alcohol use History of seizure History of hepatitis C   No past surgical history on file.  Prior to Admission medications   Medication Sig Start Date End Date Taking? Authorizing Provider  busPIRone (BUSPAR) 15 MG tablet Take 15 mg 3 (three) times daily by mouth.   Yes [provider]  ibuprofen (ADVIL,MOTRIN) 200 MG tablet Take 200 mg every 6 (six) hours as needed by mouth (FOR PAIN).   Yes [provider]  OVER THE COUNTER MEDICATION (Unnamed) over-the-counter DIURETIC: Take 1 tablet by mouth once a day for fluid retention   Yes [provider]  phenytoin (DILANTIN) 100 MG ER capsule Take 300 mg at bedtime by mouth.   Yes [provider]  topiramate (TOPAMAX) 25 MG tablet Take 25 mg 2 (two) times daily by mouth.   Yes [provider]    Scheduled Meds: . busPIRone  15 mg  Oral TID  . folic acid  1 mg Oral Daily  . furosemide  20 mg Oral Daily  . multivitamin with minerals  1 tablet Oral Daily  . pantoprazole (PROTONIX) IV  40 mg Intravenous Q24H  . phenytoin  300 mg Oral QHS  . phytonadione  5 mg Oral Daily  . spironolactone  50 mg Oral Daily  . thiamine  100 mg Oral Daily   Or  . thiamine  100 mg Intravenous Daily  . topiramate  25 mg Oral BID   Continuous Infusions: PRN Meds:.diphenhydrAMINE, LORazepam **OR** LORazepam, oxyCODONE  Allergies as of 07/19/2017  . (No Known Allergies)    No family history on file.  Social History   Socioeconomic History  . Marital status: Legally Separated    Spouse name: Not on file  . Number of children: Not on file  . Years of education: Not on file  . Highest education level: Not on file  Social Needs  . Financial resource strain: Not on file  . Food insecurity - worry: Not on file  . Food insecurity - inability: Not on file  . Transportation needs - medical: Not on file  . Transportation needs - non-medical: Not on file  Occupational History  . Not on file  Tobacco Use  . Smoking status: Not on file  Substance and Sexual Activity  . Alcohol use: Not on file  . Drug use: Not on file  . Sexual activity: Not on file  Other Topics Concern  . Not on  file  Social History Narrative  . Not on file    Review of Systems: Not able to obtain as patient is somewhat confused( Alert but confused)  Physical Exam: Vital signs: Vitals:   07/21/17 0543 07/21/17 0900  BP: 114/73 111/81  Pulse: (!) 108 (!) 110  Resp: 20   Temp: 98.1 F (36.7 C) 98.1 F (36.7 C)  SpO2: 95% 95%   Last BM Date: 07/19/17 Physical Exam  Constitutional: She appears well-developed and well-nourished. No distress.  HENT:  Head: Normocephalic and atraumatic.  Mouth/Throat: Oropharynx is clear and moist.  Eyes: EOM are normal. Scleral icterus is present.  Neck: Normal range of motion. Neck supple. No thyromegaly present.   Cardiovascular: Normal rate, regular rhythm and normal heart sounds.  Pulmonary/Chest: Effort normal and breath sounds normal. No respiratory distress.  Abdominal: Soft. Bowel sounds are normal. She exhibits distension. There is no tenderness. There is no rebound.  Musculoskeletal: Normal range of motion. She exhibits edema.  Trash Edema   Neurological: She is alert.  Confused.  Skin: Skin is warm.  Spider angioma noted on the upper  chest  Psychiatric:  Confused.  Vitals reviewed.   GI:  Lab Results: Recent Labs    07/19/17 1645 07/19/17 2220 07/20/17 0300 07/21/17 0314  WBC 5.6  --  4.8 2.8*  HGB 11.7*  --  11.2* 10.1*  HCT 33.6*  --  31.6* 30.0*  PLT PLATELET CLUMPS NOTED ON SMEAR, COUNT APPEARS DECREASED PLATELET CLUMPS NOTED ON SMEAR, COUNT APPEARS DECREASED 48* PLATELET CLUMPS NOTED ON SMEAR, COUNT APPEARS DECREASED   BMET Recent Labs    07/19/17 1645 07/20/17 0300 07/21/17 0314  NA 128* 129* 135  K 3.4* 2.8* 3.7  CL 94* 98* 108  CO2 23 22 19*  GLUCOSE 103* 102* 89  BUN <5* <5* <5*  CREATININE 0.74 0.63 0.59  CALCIUM 7.8* 7.2* 7.6*   LFT Recent Labs    07/19/17 2220  07/21/17 0314  PROT  --    < > 7.6  ALBUMIN  --    < > 2.5*  AST  --    < > 71*  ALT  --    < > 19  ALKPHOS  --    < > 145*  BILITOT  --    < > 9.5*  BILIDIR 5.6*  --   --    < > = values in this interval not displayed.   PT/INR Recent Labs    07/20/17 0300 07/21/17 0314  LABPROT 19.7* 21.0*  INR 1.68 1.82     Studies/Results: US Abdomen Complete  Result Date: 07/20/2017 CLINICAL DATA:  Hepatitis Celsius cirrhosis. EXAM: ABDOMEN ULTRASOUND COMPLETE COMPARISON:  None. FINDINGS: Gallbladder: The gallbladder wall is edematous and measures 6.4 mm. There is layering sludge within the gallbladder. The sonographic Murphy's sign was reported as negative. Common bile duct: Diameter: 3.4 mm. Liver: Coarse heterogeneous in echogenicity. Portal vein is patent on color Doppler imaging with  normal direction of blood flow towards the liver. IVC: Not seen. Pancreas: Not seen. Spleen: Prominent in size with volume of 596 cubic cm and length of 12.6 cm. Right Kidney: Length: 10.9 cm. Echogenicity within normal limits. No mass or hydronephrosis visualized. Left Kidney: Length: 10.9 cm. Echogenicity within normal limits. No mass or hydronephrosis visualized. Abdominal aorta: No aneurysm visualized, limited evaluation due to overlying bowel gas. Other findings: Moderate amount of ascites. IMPRESSION: Cirrhotic appearance of the liver with moderate amount of ascites. Portal vein is patent with  normal direction of flow. Edematous gallbladder wall measuring up to 6.4 mm. This may be due to cholecystitis or secondary to presence of abdominal ascites. Gallbladder sludge. Mild splenomegaly. Electronically Signed   By: Fidela Salisbury M.D.   On: 07/20/2017 09:33   US Paracentesis  Result Date: 07/20/2017 INDICATION: History of alcoholic cirrhosis and new findings of ascites. Request is made for therapeutic paracentesis. EXAM: ULTRASOUND GUIDED THERAPEUTIC PARACENTESIS MEDICATIONS: 1% lidocaine COMPLICATIONS: None immediate. PROCEDURE: Informed written consent was obtained from the patient after a discussion of the risks, benefits and alternatives to treatment. A timeout was performed prior to the initiation of the procedure. Initial ultrasound scanning demonstrates a small amount of ascites within the right lower abdominal quadrant. The right lower abdomen was prepped and draped in the usual sterile fashion. 1% lidocaine was used for local anesthesia. Following this, a 19 gauge, 7-cm, Yueh catheter was introduced. An ultrasound image was saved for documentation purposes. The paracentesis was performed. The catheter was removed and a dressing was applied. The patient tolerated the procedure well without immediate post procedural complication. FINDINGS: A total of approximately 1 L of serous fluid was  removed. IMPRESSION: Successful ultrasound-guided paracentesis yielding 1 liters of peritoneal fluid. Read by: Saverio Danker, PA-C Electronically Signed   By: Sandi Mariscal M.D.   On: 07/20/2017 12:46   Dg Chest Port 1 View  Result Date: 07/19/2017 CLINICAL DATA:  Shortness of breath EXAM: PORTABLE CHEST 1 VIEW COMPARISON:  None. FINDINGS: Calcified left hilar lymph nodes. Elevated right diaphragm. Patchy atelectasis or scar at the left lung base. Normal heart size. No pneumothorax. Surgical changes at the right glenoid IMPRESSION: 1. Patchy atelectasis or scar at the left base. 2. Negative for edema or focal infiltrate. Electronically Signed   By: Donavan Foil M.D.   On: 07/19/2017 20:13    Impression/Plan: - Decompensated cirrhosis with ascites. Status post paracentesis with fluid analysis is negative for SBP 07/20/2017  - Jaundice. Could be from decompensated cirrhosis versus alcoholic hepatitis. - History of hepatitis C infection. No previous treatment. - Thrombocytopenia with platelet counts in 40s - History of seizure disorder - Alcohol withdrawal  Recommendations ------------------------- - Patient with a MELD score of 23 as well as discriminant function score of 36.2.  - Given her history of seizure disorder, I would avoid use of steroids. Consider pentoxifylline  if no improvement in liver tests tomorrow for possible alcoholic hepatitis. - Absolute alcohol abstinence recommended  And advised - Follow hepatitis panel as well as HCV PCR. - Monitor daily LFTs and INR. - Avoid narcotics if possible - Long-term prognosis poor based on elevated meld score. - GI will follow   LOS: 2 days   Otis Brace  MD, FACP 07/21/2017, 10:29 AM  Contact #  [phone]

## 2017-07-21 NOTE — Progress Notes (Signed)
Patient with active visual hallucinations. Refusing telemetry and vital signs at present. Dr. Verlon Au on the unit.  Joellen Jersey, RN

## 2017-07-21 NOTE — Progress Notes (Signed)
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, EtOH-meld score on admission 21 Underwent diagnostic paracentesis rule out SBP, placed on ceftriaxone which has been discontinued as she only has 11 lymphocytes and her  pain probably from abd swelling -s/p 1 liter therapeutic paracentesis  Change oxycodone 5 mg every 6--q8 11/12 given met ecnephalopathy Started Aldactone: lasix 50 mg:20 mg and titrate q 3-5 days Limit fluids 1500 cc GI consulted  Hepatitis C reported Will need quantitative values which pending am 11/12  Coagulopathy platelets in the 40s  secondary to cirrhosis is a 9 AM-given 3 doses of vitamin K 5 mg recheck INr now 1.8 PLT have been clumped--asking for repeat labs Rpt all labs in am  dyselectromlytemia Hyponatremia probably 2/2 to potomania from etoh Hypokalemia has resolved Continue supplements kdur 40 bid Given magensium 1 G iv--add on for next am labs  Reflux continue Protonix 40 daily  Seizure disorder probably alcoholic-continue Dilantin 300 Exa, Topamax 25 twice daily  Alcohol withdrawal placed on CIWA protocol so far not withdrawing Monitoring--12 to 20 Added Libirum 25 this am and given another r50 mg dose If not controlled will need to Transfer to SDU for closer monitoring and might need to d/w CCM  No family present SCD Inpatient Await further workup   Consultants:   None yet  Procedures:   Diagnostic paracentesis  Antimicrobials:   Ceftriaxone 1 dose/   Subjective:  Confused and  slightly agitated but redirectable Needed a sitter overnight Drinking excessively and asking for ice chips CIWA range has been 12-20  Objective: Vitals:   07/20/17 1847 07/20/17 2049 07/21/17 0543 07/21/17 0900  BP: 98/66 (!) 142/92 114/73 111/81  Pulse: 97 83 (!) 108 (!) 110  Resp: '18 19 20   '$ Temp: 98.4 F (36.9 C) 98.4 F (36.9 C) 98.1 F (36.7 C) 98.1 F (36.7 C)  TempSrc: Oral Oral Oral Oral  SpO2: 98% 97% 95% 95%  Weight:  66 kg (145 lb 6.4 oz)    Height:        Intake/Output Summary (Last 24 hours) at 07/21/2017 0946 Last data filed at 07/21/2017 0650 Gross per 24 hour  Intake 5556.67 ml  Output 300 ml  Net 5256.67 ml   Filed Weights   07/19/17 1607 07/19/17 2300 07/20/17 2049  Weight: 59 kg (130 lb) 63.3 kg (139 lb 9.6 oz) 66 kg (145 lb 6.4 oz)    Examination:  Caucasian grossly icteric chest is clinically clear slightly tachycardic S1-S2 with no murmur no rales no rhonchi multiple tattoos over back Abdomen is tense and swollen still despite paracentesis 11/11 No lower extremity edema Neurologically intact--tremulous   Data Reviewed: I have personally reviewed following labs and imaging studies  CBC: Recent Labs  Lab 07/19/17 1645 07/19/17 2220 07/20/17 0300 07/21/17 0314  WBC 5.6  --  4.8 2.8*  NEUTROABS 4.1  --  2.8  --   HGB 11.7*  --  11.2* 10.1*  HCT 33.6*  --  31.6* 30.0*  MCV 93.6  --  94.0 98.7  PLT  PLATELET CLUMPS NOTED ON SMEAR, COUNT APPEARS DECREASED PLATELET CLUMPS NOTED ON SMEAR, COUNT APPEARS DECREASED 48* PLATELET CLUMPS NOTED ON SMEAR, COUNT APPEARS DECREASED   Basic Metabolic Panel: Recent Labs  Lab 07/19/17 1645 07/20/17 0300 07/21/17 0314  NA 128* 129* 135  K 3.4* 2.8* 3.7  CL 94* 98* 108  CO2 23 22 19*  GLUCOSE 103* 102* 89  BUN <5* <5* <5*  CREATININE 0.74 0.63 0.59  CALCIUM 7.8* 7.2* 7.6*  MG  --  1.5*  --    GFR: Estimated Creatinine Clearance: 75.3 mL/min (by C-G formula based on SCr of 0.59 mg/dL). Liver  Function Tests: Recent Labs  Lab 07/19/17 1645 07/20/17 0300 07/21/17 0314  AST 103* 85* 71*  ALT '22 18 19  '$ ALKPHOS 204* 182* 145*  BILITOT 10.1* 9.4* 9.5*  PROT 8.1 7.6 7.6  ALBUMIN 2.2* 2.0* 2.5*   No results for input(s): LIPASE, AMYLASE in the last 168 hours. Recent Labs  Lab 07/19/17 1812  AMMONIA 39*   Coagulation Profile: Recent Labs  Lab 07/19/17 1645 07/20/17 0300 07/21/17 0314  INR 1.63 1.68 1.82   Cardiac Enzymes: No results for input(s): CKTOTAL, CKMB, CKMBINDEX, TROPONINI in the last 168 hours. BNP (last 3 results) No results for input(s): PROBNP in the last 8760 hours. HbA1C: No results for input(s): HGBA1C in the last 72 hours. CBG: No results for input(s): GLUCAP in the last 168 hours. Lipid Profile: No results for input(s): CHOL, HDL, LDLCALC, TRIG, CHOLHDL, LDLDIRECT in the last 72 hours. Thyroid Function Tests: No results for input(s): TSH, T4TOTAL, FREET4, T3FREE, THYROIDAB in the last 72 hours. Anemia Panel: No results for input(s): VITAMINB12, FOLATE, FERRITIN, TIBC, IRON, RETICCTPCT in the last 72 hours. Urine analysis:    Component Value Date/Time   COLORURINE AMBER (A) 07/19/2017 1604   APPEARANCEUR CLOUDY (A) 07/19/2017 1604   LABSPEC 1.017 07/19/2017 1604   PHURINE 5.0 07/19/2017 1604   GLUCOSEU 50 (A) 07/19/2017 1604   HGBUR MODERATE (A) 07/19/2017 1604   BILIRUBINUR MODERATE (A) 07/19/2017 1604   KETONESUR 5 (A) 07/19/2017 1604   PROTEINUR 30 (A) 07/19/2017 1604   NITRITE NEGATIVE 07/19/2017 [place] 07/19/2017 1604     Radiology Studies: Reviewed images personally in health database    Scheduled Meds: . busPIRone  15 mg Oral TID  . folic acid  1 mg Oral Daily  . multivitamin with minerals  1 tablet Oral Daily  . pantoprazole (PROTONIX) IV  40 mg Intravenous Q24H  . phenytoin  300 mg Oral QHS  . phytonadione  5 mg Oral Daily  . thiamine  100 mg Oral Daily   Or  . thiamine  100 mg Intravenous Daily    . topiramate  25 mg Oral BID   Continuous Infusions:    LOS: 2 days    Time spent: [place], MD [facility] ([facility]   If 7PM-7AM, please contact night-coverage [email or web address] Password [facility] 07/21/2017, 9:46 AM

## 2017-07-21 NOTE — Plan of Care (Addendum)
Pt oriented to person and year but not place or situation. Pt hallucinating. MD aware. Pt must be continuously redirected. Pt is restless. Pt sitting up at desk because no sitter is available.

## 2017-07-21 NOTE — Progress Notes (Signed)
Patient arrived to unit as a transfer from 2W. Oriented to person. Partially oriented to situation, time. Believes she is staying in a hotel and stated she's getting ready to go to a party. Asked RN if she was allowed to leave for a little bit to get some beer. Patient had a sharpie in her purse that she used as eyeliner/lip liner. Also caught by NT attempting to drink hand sanitizer from room. Medications taken to pharmacy. Dr. Verlon Au aware of the above.  Joellen Jersey, RN

## 2017-07-22 DIAGNOSIS — K7031 Alcoholic cirrhosis of liver with ascites: Secondary | ICD-10-CM | POA: Diagnosis not present

## 2017-07-22 LAB — COMPREHENSIVE METABOLIC PANEL
ALT: 17 U/L (ref 14–54)
AST: 63 U/L — ABNORMAL HIGH (ref 15–41)
Albumin: 2.2 g/dL — ABNORMAL LOW (ref 3.5–5.0)
Alkaline Phosphatase: 153 U/L — ABNORMAL HIGH (ref 38–126)
Anion gap: 5 (ref 5–15)
BUN: <5 mg/dL — ABNORMAL LOW (ref 6–20)
CO2: 22 mmol/L (ref 22–32)
Calcium: 8.1 mg/dL — ABNORMAL LOW (ref 8.9–10.3)
Chloride: 108 mmol/L (ref 101–111)
Creatinine, Ser: 0.63 mg/dL (ref 0.44–1.00)
GFR calc Af Amer: >60 mL/min (ref >60)
GFR calc non Af Amer: >60 mL/min (ref >60)
Glucose, Bld: 90 mg/dL (ref 65–99)
Potassium: 3.4 mmol/L — ABNORMAL LOW (ref 3.5–5.1)
Sodium: 135 mmol/L (ref 135–145)
Total Bilirubin: 9.2 mg/dL — ABNORMAL HIGH (ref 0.3–1.2)
Total Protein: 7.2 g/dL (ref 6.5–8.1)

## 2017-07-22 LAB — HCV RNA QUANT RFLX ULTRA OR GENOTYP
HCV RNA Qnt(log copy/mL): UNDETERMINED log10 IU/mL
HepC Qn: NOT DETECTED IU/mL

## 2017-07-22 LAB — CBC WITH DIFFERENTIAL/PLATELET
Basophils Absolute: 0 10*3/uL (ref 0.0–0.1)
Basophils Relative: 1 %
Eosinophils Absolute: 0.1 10*3/uL (ref 0.0–0.7)
Eosinophils Relative: 3 %
HCT: 32 % — ABNORMAL LOW (ref 36.0–46.0)
Hemoglobin: 10.8 g/dL — ABNORMAL LOW (ref 12.0–15.0)
Lymphocytes Relative: 33 %
Lymphs Abs: 1.2 10*3/uL (ref 0.7–4.0)
MCH: 33 pg (ref 26.0–34.0)
MCHC: 33.8 g/dL (ref 30.0–36.0)
MCV: 97.9 fL (ref 78.0–100.0)
Monocytes Absolute: 0.5 10*3/uL (ref 0.1–1.0)
Monocytes Relative: 14 %
Neutro Abs: 1.9 10*3/uL (ref 1.7–7.7)
Neutrophils Relative %: 49 %
RBC: 3.27 MIL/uL — ABNORMAL LOW (ref 3.87–5.11)
RDW: 21.7 % — ABNORMAL HIGH (ref 11.5–15.5)
WBC: 3.7 10*3/uL — ABNORMAL LOW (ref 4.0–10.5)

## 2017-07-22 LAB — PROTIME-INR
INR: 1.88
Prothrombin Time: 21.4 seconds — ABNORMAL HIGH (ref 11.4–15.2)

## 2017-07-22 LAB — MAGNESIUM: Magnesium: 1.7 mg/dL (ref 1.7–2.4)

## 2017-07-22 MED ORDER — ALBUMIN HUMAN 25 % IV SOLN
50.0000 g | INTRAVENOUS | Status: AC
  Administered 2017-07-22: 50 g via INTRAVENOUS
  Filled 2017-07-22: qty 200
  Filled 2017-07-22: qty 50
  Filled 2017-07-22: qty 200

## 2017-07-22 MED ORDER — TRAZODONE HCL 50 MG PO TABS
50.0000 mg | ORAL_TABLET | Freq: Once | ORAL | Status: AC
  Administered 2017-07-22: 50 mg via ORAL
  Filled 2017-07-22: qty 1

## 2017-07-22 MED ORDER — ALBUMIN HUMAN 25 % IV SOLN
50.0000 g | Freq: Once | INTRAVENOUS | Status: AC
  Administered 2017-07-23: 50 g via INTRAVENOUS
  Filled 2017-07-22: qty 200

## 2017-07-22 MED ORDER — ACETAMINOPHEN 325 MG PO TABS
650.0000 mg | ORAL_TABLET | Freq: Four times a day (QID) | ORAL | Status: DC | PRN
  Administered 2017-07-22 – 2017-07-23 (×2): 650 mg via ORAL
  Filled 2017-07-22 (×2): qty 2

## 2017-07-22 NOTE — Progress Notes (Addendum)
Late entry: assumed care from off going RN @ 0700; patient did have AMS this AM; patient was having hallucinations @ [phone] but denies hearing voices;  Patient had sitter up to 15:30; MD rounded and discontinued sitter based on patient calm behavior and follows commands with no problems;  MD notified about hypotensive state, see epic for new orders  Patient getting albumin to help with volume/ BP; report given to on coming nurse;

## 2017-07-22 NOTE — Progress Notes (Signed)
[facility] Gastroenterology Progress Note  Briana Sweeney 46 y.o. March 23, 1971  CC:  Cirrhosis with ascites, jaundice   Subjective: Patient seen and examined at bedside. Somewhat confused. Complaining of abdominal discomfort. Chart reviewed. Patient with active hallucinations  ROS : Not able to obtain   Objective: Vital signs in last 24 hours: Vitals:   07/22/17 0740 07/22/17 1147  BP: 96/64 114/73  Pulse: 74 98  Resp: 18 19  Temp: 97.9 F (36.6 C) 98.1 F (36.7 C)  SpO2: 98% 96%    Physical Exam:  General:  Alert but confused      Eyes:  Scleral icterus noted   Lungs:   Clear to auscultation bilaterally, respirations unlabored.   Heart:  Regular rate and rhythm, S1, S2 normal  Abdomen:   Mildly distended abdomen with generalized tenderness on palpation, no peritoneal signs bowel sounds active all four quadrants,  no masses,   Extremities: Extremities normal, atraumatic, 1 +  edema       Lab Results: Recent Labs    07/20/17 0300 07/21/17 0314 07/22/17 0304  NA 129* 135 135  K 2.8* 3.7 3.4*  CL 98* 108 108  CO2 22 19* 22  GLUCOSE 102* 89 90  BUN <5* <5* <5*  CREATININE 0.63 0.59 0.63  CALCIUM 7.2* 7.6* 8.1*  MG 1.5*  --  1.7   Recent Labs    07/21/17 0314 07/22/17 0304  AST 71* 63*  ALT 19 17  ALKPHOS 145* 153*  BILITOT 9.5* 9.2*  PROT 7.6 7.2  ALBUMIN 2.5* 2.2*   Recent Labs    07/20/17 0300  07/21/17 0846 07/22/17 0304  WBC 4.8   < > 4.3 3.7*  NEUTROABS 2.8  --   --  1.9  HGB 11.2*   < > 10.7* 10.8*  HCT 31.6*   < > 32.3* 32.0*  MCV 94.0   < > 98.8 97.9  PLT 48*   < > PLATELET CLUMPING, SUGGEST RECOLLECTION OF SAMPLE IN CITRATE TUBE. PLATELET CLUMPING, SUGGEST RECOLLECTION OF SAMPLE IN CITRATE TUBE.   < > = values in this interval not displayed.   Recent Labs    07/21/17 0314 07/22/17 0304  LABPROT 21.0* 21.4*  INR 1.82 1.88      Assessment/Plan: - Decompensated cirrhosis with ascites. Status post paracentesis with fluid analysis is  negative for SBP 07/20/2017  - Jaundice. Could be from decompensated cirrhosis versus alcoholic hepatitis. - History of hepatitis C infection. No previous treatment. - Thrombocytopenia with platelet counts in 40s - History of seizure disorder - Alcohol withdrawal - Confusion with hallucinations  Recommendations ------------------------- -  MELD score of 23 and  discriminant function score of 37.7 as of today   - Given her history of seizure disorder, I would avoid use of steroids. - Given active hallucinations, I would also avoid pentoxifylline as it can worsen  hallucinations   - Absolute alcohol abstinence recommended  -  negative hepatitis B surface antigen. Positive HCV antibody. HCV PCR pending  - Monitor daily LFTs and INR. - Avoid narcotics if possible - Long-term prognosis poor based on elevated meld score. - GI will follow     Otis Brace MD, [place] 07/22/2017, 12:53 PM  Contact #  [phone]

## 2017-07-22 NOTE — Care Management Note (Signed)
Case Management Note Marvetta Gibbons RN, BSN Unit 4E-Case Manager [phone]  Patient Details  Name: Briana Sweeney MRN: [id number] Date of Birth: 09/25/1970  Subjective/Objective:  Admitted with  SBP (spontaneous bacterial peritonitis)- on CIWA protocol having hallucinations.                 Action/Plan: PTA pt lived at home- hx ETOH, hx seizures-- CM and CSW to follow for d/c needs  Expected Discharge Date:                  Expected Discharge Plan:  Home/Self Care  In-House Referral:  Clinical Social Work  Discharge planning Services  CM Consult  Post Acute Care Choice:    Choice offered to:     DME Arranged:    DME Agency:     HH Arranged:    [place] Agency:     Status of Service:  In process, will continue to follow  If discussed at Long Length of Stay Meetings, dates discussed:    Discharge Disposition:   Additional Comments:  Dawayne Patricia, RN 07/22/2017, 11:49 AM

## 2017-07-22 NOTE — Progress Notes (Signed)
PROGRESS NOTE    Briana Sweeney  JSE:[id number] DOB: Sep 27, 1970 DOA: 07/19/2017 PCP: Patient, No Pcp Per   Specialists:     Brief Narrative:  46 year old female Known history of EtOH Hep C untreated Seizures  Labs on admission showed sodium 129 potassium 2.8 BUN/creatinine 5/0.6 Magnesium 1.5 alk phos 182 AST/ALT 85/18, total bilirubin 9.4 Platelet 48  She developed metabolic encephalopathy probably secondary to acute alcohol withdrawal over 11/12 but has stabilized on Librium protocol 11/13 and may be transferable back to regular floor Paracentesis has been ordered for 11/14  Assessment & Plan:   Active Problems:   SBP (spontaneous bacterial peritonitis) ([place])  Unlikely SBP in setting of 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, EtOH-meld score on admission 21 Underwent diagnostic paracentesis rule out SBP, placed on ceftriaxone which has been discontinued as she only has 11 lymphocytes and her pain probably from abd swelling -s/p 1 liter therapeutic paracentesis  Change oxycodone 5 mg every 6--q8 11/12 given met ecnephalopathy Started Aldactone: lasix 50 mg:20 mg and titrate q 3-5 days Limit fluids 1500 cc GI consulted Will put in orders for further paracentesis 11/14--if felt not necessary by GI can be consult  Hepatitis C reported Will need quantitative values which pending am since 11/12  Coagulopathy platelets in the 40s  secondary to cirrhosis is a 9 AM-given 3 doses of vitamin K 5 mg recheck INr now 1.8 PLT have been clumped, white count up from 2.3.7 hemoglobin is 10.1, INR is 1.8  dyselectromlytemia Hyponatremia probably 2/2 to potomania from etoh-resolved to 135 on 11/13 Hypokalemia now up from 2.8-3.4 Continue supplements kdur 40 bid Given magensium 1 G iv--recheck a.m. up from 1.5-1.7  Reflux continue Protonix 40 daily  Seizure disorder probably alcoholic-continue Dilantin 300 Exa, Topamax 25 twice daily  Alcohol withdrawal placed on CIWA protocol so far not  withdrawing Monitoring--she is less confused today and Librium seems to have worked better for her transferred to SDU for closer monitoring --stabilizing and could return to floor a.m. 11/14 no further confusion  No family present SCD Inpatient Await further workup   Consultants:   None yet  Procedures:   Diagnostic paracentesis  Antimicrobials:   Ceftriaxone 1 dose/   Subjective:  Much less confused and agitated than previous Seems to be more oriented No chest pain but does feel bloated in her abdomen is No nausea no vomiting  Objective: Vitals:   07/21/17 2211 07/22/17 0318 07/22/17 0740 07/22/17 1147  BP: 99/78 114/68 96/64 114/73  Pulse:  (!) 54 74 98  Resp:   18 19  Temp:  (!) 97.4 F (36.3 C) 97.9 F (36.6 C) 98.1 F (36.7 C)  TempSrc:  Oral Oral Oral  SpO2:  100% 98% 96%  Weight:      Height:        Intake/Output Summary (Last 24 hours) at 07/22/2017 1325 Last data filed at 07/22/2017 1050 Gross per 24 hour  Intake 600 ml  Output 1100 ml  Net -500 ml   Filed Weights   07/19/17 1607 07/19/17 2300 07/20/17 2049  Weight: 59 kg (130 lb) 63.3 kg (139 lb 9.6 oz) 66 kg (145 lb 6.4 oz)    Examination:  Awake alert less icteric 1 S2 no murmur No rales no rhonchi Multiple tattoos over back Abdomen is tense and seems slightly larger than prior exam No lower extremity edema Neurologically intact--tremulous   Data Reviewed: I have personally reviewed following labs and imaging studies  CBC: Recent Labs  Lab 07/19/17 1645 07/19/17  2220 07/20/17 0300 07/21/17 0314 07/21/17 0846 07/22/17 0304  WBC 5.6  --  4.8 2.8* 4.3 3.7*  NEUTROABS 4.1  --  2.8  --   --  1.9  HGB 11.7*  --  11.2* 10.1* 10.7* 10.8*  HCT 33.6*  --  31.6* 30.0* 32.3* 32.0*  MCV 93.6  --  94.0 98.7 98.8 97.9  PLT PLATELET CLUMPS NOTED ON SMEAR, COUNT APPEARS DECREASED PLATELET CLUMPS NOTED ON SMEAR, COUNT APPEARS DECREASED 48* PLATELET CLUMPS NOTED ON SMEAR, COUNT APPEARS  DECREASED PLATELET CLUMPING, SUGGEST RECOLLECTION OF SAMPLE IN CITRATE TUBE. PLATELET CLUMPING, SUGGEST RECOLLECTION OF SAMPLE IN CITRATE TUBE.   Basic Metabolic Panel: Recent Labs  Lab 07/19/17 1645 07/20/17 0300 07/21/17 0314 07/22/17 0304  NA 128* 129* 135 135  K 3.4* 2.8* 3.7 3.4*  CL 94* 98* 108 108  CO2 23 22 19* 22  GLUCOSE 103* 102* 89 90  BUN <5* <5* <5* <5*  CREATININE 0.74 0.63 0.59 0.63  CALCIUM 7.8* 7.2* 7.6* 8.1*  MG  --  1.5*  --  1.7   GFR: Estimated Creatinine Clearance: 75.3 mL/min (by C-G formula based on SCr of 0.63 mg/dL). Liver Function Tests: Recent Labs  Lab 07/19/17 1645 07/20/17 0300 07/21/17 0314 07/22/17 0304  AST 103* 85* 71* 63*  ALT 22 18 19 17  ALKPHOS 204* 182* 145* 153*  BILITOT 10.1* 9.4* 9.5* 9.2*  PROT 8.1 7.6 7.6 7.2  ALBUMIN 2.2* 2.0* 2.5* 2.2*   No results for input(s): LIPASE, AMYLASE in the last 168 hours. Recent Labs  Lab 07/19/17 1812  AMMONIA 39*   Coagulation Profile: Recent Labs  Lab 07/19/17 1645 07/20/17 0300 07/21/17 0314 07/22/17 0304  INR 1.63 1.68 1.82 1.88   Cardiac Enzymes: No results for input(s): CKTOTAL, CKMB, CKMBINDEX, TROPONINI in the last 168 hours. BNP (last 3 results) No results for input(s): PROBNP in the last 8760 hours. HbA1C: No results for input(s): HGBA1C in the last 72 hours. CBG: No results for input(s): GLUCAP in the last 168 hours. Lipid Profile: No results for input(s): CHOL, HDL, LDLCALC, TRIG, CHOLHDL, LDLDIRECT in the last 72 hours. Thyroid Function Tests: No results for input(s): TSH, T4TOTAL, FREET4, T3FREE, THYROIDAB in the last 72 hours. Anemia Panel: No results for input(s): VITAMINB12, FOLATE, FERRITIN, TIBC, IRON, RETICCTPCT in the last 72 hours. Urine analysis:    Component Value Date/Time   COLORURINE AMBER (A) 07/19/2017 1604   APPEARANCEUR CLOUDY (A) 07/19/2017 1604   LABSPEC 1.017 07/19/2017 1604   PHURINE 5.0 07/19/2017 1604   GLUCOSEU 50 (A) 07/19/2017  1604   HGBUR MODERATE (A) 07/19/2017 1604   BILIRUBINUR MODERATE (A) 07/19/2017 1604   KETONESUR 5 (A) 07/19/2017 1604   PROTEINUR 30 (A) 07/19/2017 1604   NITRITE NEGATIVE 07/19/2017 1604   LEUKOCYTESUR NEGATIVE 07/19/2017 1604     Radiology Studies: Reviewed images personally in health database    Scheduled Meds: . busPIRone  15 mg Oral TID  . chlordiazePOXIDE  25 mg Oral TID   Followed by  . [START ON 07/23/2017] chlordiazePOXIDE  25 mg Oral BH-qamhs   Followed by  . [START ON 07/24/2017] chlordiazePOXIDE  25 mg Oral Daily  . folic acid  1 mg Oral Daily  . furosemide  20 mg Oral Daily  . multivitamin with minerals  1 tablet Oral Daily  . pantoprazole  40 mg Oral QHS  . phenytoin  300 mg Oral QHS  . spironolactone  50 mg Oral Daily  . thiamine  100   mg Oral Daily  . topiramate  25 mg Oral BID   Continuous Infusions:    LOS: 3 days    Time spent: 25    Jai , MD [facility] (P) [phone]   If 7PM-7AM, please contact night-coverage [email or web address] Password [email or web address] 07/22/2017, 1:25 PM    

## 2017-07-23 ENCOUNTER — Encounter (HOSPITAL_COMMUNITY): Payer: Self-pay | Admitting: General Surgery

## 2017-07-23 ENCOUNTER — Inpatient Hospital Stay (HOSPITAL_COMMUNITY): Payer: Self-pay

## 2017-07-23 DIAGNOSIS — K7290 Hepatic failure, unspecified without coma: Secondary | ICD-10-CM

## 2017-07-23 DIAGNOSIS — D689 Coagulation defect, unspecified: Secondary | ICD-10-CM | POA: Diagnosis not present

## 2017-07-23 DIAGNOSIS — B182 Chronic viral hepatitis C: Secondary | ICD-10-CM

## 2017-07-23 DIAGNOSIS — R768 Other specified abnormal immunological findings in serum: Secondary | ICD-10-CM | POA: Diagnosis present

## 2017-07-23 DIAGNOSIS — E876 Hypokalemia: Secondary | ICD-10-CM | POA: Diagnosis present

## 2017-07-23 DIAGNOSIS — D61818 Other pancytopenia: Secondary | ICD-10-CM | POA: Diagnosis present

## 2017-07-23 DIAGNOSIS — F1010 Alcohol abuse, uncomplicated: Secondary | ICD-10-CM | POA: Diagnosis not present

## 2017-07-23 DIAGNOSIS — K7031 Alcoholic cirrhosis of liver with ascites: Secondary | ICD-10-CM | POA: Diagnosis not present

## 2017-07-23 DIAGNOSIS — K7460 Unspecified cirrhosis of liver: Secondary | ICD-10-CM

## 2017-07-23 DIAGNOSIS — R17 Unspecified jaundice: Secondary | ICD-10-CM | POA: Diagnosis present

## 2017-07-23 HISTORY — DX: Unspecified cirrhosis of liver: K74.60

## 2017-07-23 HISTORY — DX: Other pancytopenia: D61.818

## 2017-07-23 HISTORY — PX: IR PARACENTESIS: IMG2679

## 2017-07-23 HISTORY — DX: Hepatic failure, unspecified without coma: K72.90

## 2017-07-23 LAB — CBC WITH DIFFERENTIAL/PLATELET
Basophils Absolute: 0 10*3/uL (ref 0.0–0.1)
Basophils Relative: 0 %
Eosinophils Absolute: 0.1 10*3/uL (ref 0.0–0.7)
Eosinophils Relative: 3 %
HCT: 26.6 % — ABNORMAL LOW (ref 36.0–46.0)
Hemoglobin: 9.1 g/dL — ABNORMAL LOW (ref 12.0–15.0)
Lymphocytes Relative: 40 %
Lymphs Abs: 1.2 10*3/uL (ref 0.7–4.0)
MCH: 32.9 pg (ref 26.0–34.0)
MCHC: 34.2 g/dL (ref 30.0–36.0)
MCV: 96 fL (ref 78.0–100.0)
Monocytes Absolute: 0.5 10*3/uL (ref 0.1–1.0)
Monocytes Relative: 16 %
Neutro Abs: 1.3 10*3/uL — ABNORMAL LOW (ref 1.7–7.7)
Neutrophils Relative %: 41 %
RBC: 2.77 MIL/uL — ABNORMAL LOW (ref 3.87–5.11)
RDW: 21.8 % — ABNORMAL HIGH (ref 11.5–15.5)
WBC: 3.1 10*3/uL — ABNORMAL LOW (ref 4.0–10.5)

## 2017-07-23 LAB — PROTIME-INR
INR: 2.11
Prothrombin Time: 23.4 seconds — ABNORMAL HIGH (ref 11.4–15.2)

## 2017-07-23 LAB — BASIC METABOLIC PANEL
Anion gap: 6 (ref 5–15)
BUN: <5 mg/dL — ABNORMAL LOW (ref 6–20)
CO2: 22 mmol/L (ref 22–32)
Calcium: 8.8 mg/dL — ABNORMAL LOW (ref 8.9–10.3)
Chloride: 109 mmol/L (ref 101–111)
Creatinine, Ser: 0.77 mg/dL (ref 0.44–1.00)
GFR calc Af Amer: >60 mL/min (ref >60)
GFR calc non Af Amer: >60 mL/min (ref >60)
Glucose, Bld: 95 mg/dL (ref 65–99)
Potassium: 3.4 mmol/L — ABNORMAL LOW (ref 3.5–5.1)
Sodium: 137 mmol/L (ref 135–145)

## 2017-07-23 LAB — BODY FLUID CULTURE
Culture: NO GROWTH
Special Requests: NORMAL

## 2017-07-23 MED ORDER — DIPHENHYDRAMINE HCL 25 MG PO CAPS: 50.0000 mg | ORAL_CAPSULE | Freq: Every evening | ORAL | Status: DC | PRN

## 2017-07-23 MED ORDER — LIDOCAINE 2% (20 MG/ML) 5 ML SYRINGE
INTRAMUSCULAR | Status: AC
  Filled 2017-07-23: qty 10

## 2017-07-23 MED ORDER — POTASSIUM CHLORIDE CRYS ER 20 MEQ PO TBCR
40.0000 meq | EXTENDED_RELEASE_TABLET | Freq: Once | ORAL | Status: AC
  Administered 2017-07-27: 40 meq via ORAL
  Filled 2017-07-23: qty 2

## 2017-07-23 MED ORDER — LACTULOSE 10 GM/15ML PO SOLN
10.0000 g | Freq: Two times a day (BID) | ORAL | Status: DC | PRN
  Administered 2017-07-23: 10 g via ORAL
  Filled 2017-07-23 (×2): qty 15

## 2017-07-23 MED ORDER — PENTOXIFYLLINE ER 400 MG PO TBCR
400.0000 mg | EXTENDED_RELEASE_TABLET | Freq: Three times a day (TID) | ORAL | Status: DC
  Administered 2017-07-23: 400 mg via ORAL
  Filled 2017-07-23: qty 1

## 2017-07-23 MED ORDER — TRAZODONE HCL 100 MG PO TABS
100.0000 mg | ORAL_TABLET | Freq: Every day | ORAL | Status: DC
  Administered 2017-07-23 – 2017-07-26 (×4): 100 mg via ORAL
  Filled 2017-07-23 (×4): qty 1

## 2017-07-23 NOTE — Procedures (Signed)
Ultrasound-guided therapeutic paracentesis performed yielding 0.7 liters of icteric colored fluid. No immediate complications. Patient still feels full after the procedure despite no fluid seen on [place].  I informed her she should discuss this with her physician.  Eljay Lave E 11:42 AM 07/23/2017

## 2017-07-23 NOTE — Progress Notes (Signed)
[facility] Gastroenterology Progress Note  Briana Sweeney 46 y.o. 1970-11-03  CC:  Cirrhosis with ascites, jaundice   Subjective: Patient seen and examined at bedside. More alert compared to yesterday. Daughter at bedside. Underwent paracentesis yesterday with removal of 0.7 L of fluid. Complaining of mild abdominal discomfort. Denied any nausea or vomiting. C/o  of constipation with no bowel movement in last several days.  ROS : Negative for chest pain or shortness of breath   Objective: Vital signs in last 24 hours: Vitals:   07/23/17 0450 07/23/17 0812  BP: (!) 107/55   Pulse: 99   Resp: (!) 21   Temp: 98.1 F (36.7 C) 98.9 F (37.2 C)  SpO2: 93%     Physical Exam:  General:  Alert and oriented 2.      Eyes:  Scleral icterus noted   Lungs:   Clear to auscultation bilaterally, respirations unlabored.   Heart:  Regular rate and rhythm, S1, S2 normal  Abdomen:   Mildly distended abdomen with generalized tenderness on palpation, no peritoneal signs bowel sounds active all four quadrants,  no masses,   Extremities: Extremities normal, atraumatic, 1 +  edema       Lab Results: Recent Labs    07/22/17 0304 07/23/17 0324  NA 135 137  K 3.4* 3.4*  CL 108 109  CO2 22 22  GLUCOSE 90 95  BUN <5* <5*  CREATININE 0.63 0.77  CALCIUM 8.1* 8.8*  MG 1.7  --    Recent Labs    07/21/17 0314 07/22/17 0304  AST 71* 63*  ALT 19 17  ALKPHOS 145* 153*  BILITOT 9.5* 9.2*  PROT 7.6 7.2  ALBUMIN 2.5* 2.2*   Recent Labs    07/22/17 0304 07/23/17 0324  WBC 3.7* 3.1*  NEUTROABS 1.9 1.3*  HGB 10.8* 9.1*  HCT 32.0* 26.6*  MCV 97.9 96.0  PLT PLATELET CLUMPING, SUGGEST RECOLLECTION OF SAMPLE IN CITRATE TUBE. PLATELET CLUMPING, SUGGEST RECOLLECTION OF SAMPLE IN CITRATE TUBE.   Recent Labs    07/22/17 0304 07/23/17 0324  LABPROT 21.4* 23.4*  INR 1.88 2.11      Assessment/Plan: - Decompensated cirrhosis with ascites. Status post paracentesis with fluid analysis is negative  for SBP 07/20/2017  - Jaundice. Could be from decompensated cirrhosis versus alcoholic hepatitis. - History of hepatitis C infection. Negative HCV PCR. Most likely she has cleared her hepatitis C. - Thrombocytopenia with platelet counts in 40s - History of seizure disorder - Alcohol withdrawal - Confusion with hallucinations : Resolved - Constipation  Recommendations ------------------------- -  MELD score of 23 and  discriminant function score of 46.9 which is mildly elevated compared to yesterday. - Given her history of seizure disorder, I would avoid use of steroids. Discussed with the nursing staff. Patient's confusion is resolved now. No further evaluation admission. I will start her on pentoxifylline because of worsening discriminant function score. Consider DC pentoxifylline if no improvement in bilirubin in next 5 To 7 days  - Start lactulose for constipation.  - Absolute alcohol abstinence recommended  -  negative hepatitis B surface antigen. Positive HCV antibody with negative HCV PCR   - Monitor daily LFTs and INR. - Avoid narcotics if possible - Long-term prognosis poor based on elevated meld score. - GI will follow     Otis Brace MD, [place] 07/23/2017, 3:46 PM  Contact #  [phone]

## 2017-07-23 NOTE — Progress Notes (Signed)
Progress Note    Briana Sweeney  IEP:[id number] DOB: 12/19/1970  DOA: 07/19/2017 PCP: Patient, No Pcp Per    Brief Narrative:   Chief complaint: Follow-up ascites/liver disease  Medical records reviewed and are as summarized below:  Briana Sweeney is an 46 y.o. female with a PMH of alcohol abuse, untreated hepatitis C, cirrhosis of the liver and seizure disorder who was admitted 07/19/17 for evaluation of a 2 day history of increased abdominal swelling associated with shortness of breath. Upon initial evaluation, the patient was noted to be tachypneic and tachycardic. She was hyponatremic with a sodium of 128 and had elevated bilirubin of 10.1. She was coagulopathic with a PT/INR 1.63. Diagnostic paracentesis was performed due to concerns for SBP and she was placed on empiric ceftriaxone.  Assessment/Plan:   Principal Problem:   Decompensated hepatic cirrhosis (HCC) versus alcoholic hepatitis/jaundice/ascites No evidence of SBP status post paracentesis. Started on Lasix and Aldactone with fluid restriction of 1.5 L. GI following. Steroids not currently recommended given active hallucinations. Meld score 23 with discriminate function score 37.7. Counseled on the need for absolute abstinence from alcohol. Poor long-term prognosis given meld score.  Active Problems:   Alcohol abuse/DTs Continue Librium detox protocol. Medication seeking and wants extra medications for sleep and for pain. Patient is not tremulous but does report some visual hallucinations. Discontinue Trental. Avoid increasing narcotics or other psychotropic medications. Continue folic acid supplementation.    Hepatitis C, chronic (HCC) Negative hepatitis B surface antigen. Positive HCV antibody. HCV PCR pending    Hypokalemia Replete.    Coagulopathy ([place]) Monitor for signs of bleeding.    Hypomagnesemia Repleted.    Pancytopenia ([place]) Likely from the toxic effects of alcohol and the bone marrow. All cell lines  depressed. Monitor CBC.    Bipolar Continue Topamax and BuSpar.  Family Communication/Anticipated D/C date and plan/Code Status   DVT prophylaxis: SCDs ordered. Code Status: Full Code.  Family Communication: Daughter updated at the bedside. Disposition Plan: Home in the next 24-48 hours if she remains stable.   Medical Consultants:    Gastroenterology   Anti-Infectives:    None, empiric Rocephin discontinued.  Subjective:   Reports difficulty sleeping, abdominal discomfort. No BMs in several days. No nausea or vomiting.  Poor appetite.  Objective:    Vitals:   07/23/17 0450 07/23/17 0812 07/23/17 1030 07/23/17 1530  BP: (!) 107/55  97/63 (!) 88/58  Pulse: 99  (!) 104   Resp: (!) 21  (!) 24 19  Temp: 98.1 F (36.7 C) 98.9 F (37.2 C)  98.3 F (36.8 C)  TempSrc: Oral Oral  Oral  SpO2: 93%  93% 98%  Weight: 66.1 kg (145 lb 11.2 oz)     Height:        Intake/Output Summary (Last 24 hours) at 07/23/2017 1714 Last data filed at 07/22/2017 2237 Gross per 24 hour  Intake 240 ml  Output -  Net 240 ml   Filed Weights   07/19/17 2300 07/20/17 2049 07/23/17 0450  Weight: 63.3 kg (139 lb 9.6 oz) 66 kg (145 lb 6.4 oz) 66.1 kg (145 lb 11.2 oz)    Exam: General: No acute distress. Cardiovascular: Heart sounds show a regular rate, and rhythm. No gallops or rubs. No murmurs. No JVD. Lungs: Coarse with bibasilar rales. Abdomen: Mildly distended and mildly tender but no rebound or guarding. No masses. + splenomegaly. Neurological: Alert and oriented 3. Moves all extremities 4 with equal strength. Cranial nerves II through  XII grossly intact. Skin: Icteric. Warm and dry. No rashes or lesions. Extremities: No clubbing or cyanosis. 2+ edema. Pedal pulses 2+. Psychiatric: Mood and affect are depressed/flat. Insight and judgment are poor. Wearing dramatic/exaggerated makeup.  Data Reviewed:   I have personally reviewed following labs and imaging studies:  Labs: Labs  show the following:   Basic Metabolic Panel: Recent Labs  Lab 07/19/17 1645 07/20/17 0300 07/21/17 0314 07/22/17 0304 07/23/17 0324  NA 128* 129* 135 135 137  K 3.4* 2.8* 3.7 3.4* 3.4*  CL 94* 98* 108 108 109  CO2 23 22 19* 22 22  GLUCOSE 103* 102* 89 90 95  BUN <5* <5* <5* <5* <5*  CREATININE 0.74 0.63 0.59 0.63 0.77  CALCIUM 7.8* 7.2* 7.6* 8.1* 8.8*  MG  --  1.5*  --  1.7  --    GFR Estimated Creatinine Clearance: 75.3 mL/min (by C-G formula based on SCr of 0.77 mg/dL). Liver Function Tests: Recent Labs  Lab 07/19/17 1645 07/20/17 0300 07/21/17 0314 07/22/17 0304  AST 103* 85* 71* 63*  ALT 22 18 19 17   ALKPHOS 204* 182* 145* 153*  BILITOT 10.1* 9.4* 9.5* 9.2*  PROT 8.1 7.6 7.6 7.2  ALBUMIN 2.2* 2.0* 2.5* 2.2*    Recent Labs  Lab 07/19/17 1812  AMMONIA 39*   Coagulation profile Recent Labs  Lab 07/19/17 1645 07/20/17 0300 07/21/17 0314 07/22/17 0304 07/23/17 0324  INR 1.63 1.68 1.82 1.88 2.11    CBC: Recent Labs  Lab 07/19/17 1645  07/20/17 0300 07/21/17 0314 07/21/17 0846 07/22/17 0304 07/23/17 0324  WBC 5.6  --  4.8 2.8* 4.3 3.7* 3.1*  NEUTROABS 4.1  --  2.8  --   --  1.9 1.3*  HGB 11.7*  --  11.2* 10.1* 10.7* 10.8* 9.1*  HCT 33.6*  --  31.6* 30.0* 32.3* 32.0* 26.6*  MCV 93.6  --  94.0 98.7 98.8 97.9 96.0  PLT PLATELET CLUMPS NOTED ON SMEAR, COUNT APPEARS DECREASED   < > 48* PLATELET CLUMPS NOTED ON SMEAR, COUNT APPEARS DECREASED PLATELET CLUMPING, SUGGEST RECOLLECTION OF SAMPLE IN CITRATE TUBE. PLATELET CLUMPING, SUGGEST RECOLLECTION OF SAMPLE IN CITRATE TUBE. PLATELET CLUMPING, SUGGEST RECOLLECTION OF SAMPLE IN CITRATE TUBE.   < > = values in this interval not displayed.   Sepsis Labs: Recent Labs  Lab 07/19/17 2220  07/21/17 0314 07/21/17 0846 07/22/17 0304 07/23/17 0324  WBC  --    < > 2.8* 4.3 3.7* 3.1*  LATICACIDVEN 1.0  --   --   --   --   --    < > = values in this interval not displayed.    Microbiology Recent Results  (from the past 240 hour(s))  Body fluid culture ( includes gram stain)     Status: None   Collection Time: 07/19/17  7:57 PM  Result Value Ref Range Status   Specimen Description FLUID PERITONEAL CAVITY  Final   Special Requests Normal  Final   Gram Stain   Final    WBC PRESENT, PREDOMINANTLY MONONUCLEAR NO ORGANISMS SEEN CYTOSPIN SMEAR    Culture NO GROWTH 3 DAYS  Final   Report Status 07/23/2017 FINAL  Final    Procedures and diagnostic studies:  07/23/17 Ir Paracentesis: Successful ultrasound-guided paracentesis yielding 0.7 liters of peritoneal fluid.  07/20/17 Ir Paracentesis: Successful ultrasound-guided paracentesis yielding 1 liters of peritoneal fluid. 07/20/17 US Abdomen complete: Cirrhotic appearance of the liver with moderate amount of ascites. Portal vein is patent with normal direction of  flow. Edematous gallbladder wall measuring up to 6.4 mm. This may be due to cholecystitis or secondary to presence of abdominal ascites. Gallbladder sludge. Mild splenomegaly. 07/19/17: PCXR: Patchy atelectasis or scar at the left base. No evidence of pneumonia or CHF.   Medications:   . busPIRone  15 mg Oral TID  . chlordiazePOXIDE  25 mg Oral BH-qamhs   Followed by  . [START ON 07/24/2017] chlordiazePOXIDE  25 mg Oral Daily  . folic acid  1 mg Oral Daily  . furosemide  20 mg Oral Daily  . lidocaine      . multivitamin with minerals  1 tablet Oral Daily  . pantoprazole  40 mg Oral QHS  . pentoxifylline  400 mg Oral TID WC  . phenytoin  300 mg Oral QHS  . spironolactone  50 mg Oral Daily  . thiamine  100 mg Oral Daily  . topiramate  25 mg Oral BID  . traZODone  100 mg Oral QHS   Continuous Infusions:   LOS: 4 days   Trisha Ken  [facility] Pager [phone]. If unable to reach me by pager, please call my cell phone at [phone].  *Please refer to [email or web address], password [email or web address] to get updated schedule on who will round on this patient, as hospitalists switch  teams weekly. If 7PM-7AM, please contact night-coverage at [email or web address], password [email or web address] for any overnight needs.  07/23/2017, 5:14 PM

## 2017-07-24 ENCOUNTER — Inpatient Hospital Stay (HOSPITAL_COMMUNITY): Payer: Self-pay

## 2017-07-24 DIAGNOSIS — R140 Abdominal distension (gaseous): Secondary | ICD-10-CM

## 2017-07-24 DIAGNOSIS — K5900 Constipation, unspecified: Secondary | ICD-10-CM

## 2017-07-24 DIAGNOSIS — K7031 Alcoholic cirrhosis of liver with ascites: Secondary | ICD-10-CM | POA: Diagnosis not present

## 2017-07-24 DIAGNOSIS — F1010 Alcohol abuse, uncomplicated: Secondary | ICD-10-CM | POA: Diagnosis not present

## 2017-07-24 DIAGNOSIS — K7290 Hepatic failure, unspecified without coma: Secondary | ICD-10-CM | POA: Diagnosis not present

## 2017-07-24 LAB — COMPREHENSIVE METABOLIC PANEL
ALT: 14 U/L (ref 14–54)
AST: 52 U/L — ABNORMAL HIGH (ref 15–41)
Albumin: 2.9 g/dL — ABNORMAL LOW (ref 3.5–5.0)
Alkaline Phosphatase: 109 U/L (ref 38–126)
Anion gap: 9 (ref 5–15)
BUN: <5 mg/dL — ABNORMAL LOW (ref 6–20)
CO2: 18 mmol/L — ABNORMAL LOW (ref 22–32)
Calcium: 8.7 mg/dL — ABNORMAL LOW (ref 8.9–10.3)
Chloride: 110 mmol/L (ref 101–111)
Creatinine, Ser: 0.78 mg/dL (ref 0.44–1.00)
GFR calc Af Amer: >60 mL/min (ref >60)
GFR calc non Af Amer: >60 mL/min (ref >60)
Glucose, Bld: 110 mg/dL — ABNORMAL HIGH (ref 65–99)
Potassium: 3.1 mmol/L — ABNORMAL LOW (ref 3.5–5.1)
Sodium: 137 mmol/L (ref 135–145)
Total Bilirubin: 8.5 mg/dL — ABNORMAL HIGH (ref 0.3–1.2)
Total Protein: 7.2 g/dL (ref 6.5–8.1)

## 2017-07-24 LAB — CBC
HCT: 29.6 % — ABNORMAL LOW (ref 36.0–46.0)
Hemoglobin: 10 g/dL — ABNORMAL LOW (ref 12.0–15.0)
MCH: 33 pg (ref 26.0–34.0)
MCHC: 33.8 g/dL (ref 30.0–36.0)
MCV: 97.7 fL (ref 78.0–100.0)
Platelets: UNDETERMINED 10*3/uL (ref 150–400)
RBC: 3.03 MIL/uL — ABNORMAL LOW (ref 3.87–5.11)
RDW: 22.2 % — ABNORMAL HIGH (ref 11.5–15.5)
WBC: 3.2 10*3/uL — ABNORMAL LOW (ref 4.0–10.5)

## 2017-07-24 LAB — PROTIME-INR
INR: 2.09
Prothrombin Time: 23.3 seconds — ABNORMAL HIGH (ref 11.4–15.2)

## 2017-07-24 MED ORDER — IOPAMIDOL (ISOVUE-300) INJECTION 61%
INTRAVENOUS | Status: AC
  Administered 2017-07-24: 100 mL
  Filled 2017-07-24: qty 100

## 2017-07-24 MED ORDER — LACTULOSE 10 GM/15ML PO SOLN
20.0000 g | Freq: Four times a day (QID) | ORAL | Status: DC
  Administered 2017-07-24: 20 g via ORAL
  Filled 2017-07-24 (×2): qty 30

## 2017-07-24 MED ORDER — IOPAMIDOL (ISOVUE-300) INJECTION 61%
INTRAVENOUS | Status: AC
  Filled 2017-07-24: qty 30

## 2017-07-24 MED ORDER — LACTULOSE 10 GM/15ML PO SOLN
20.0000 g | Freq: Four times a day (QID) | ORAL | Status: DC
  Administered 2017-07-24 – 2017-07-26 (×7): 20 g via ORAL
  Filled 2017-07-24 (×7): qty 30

## 2017-07-24 NOTE — Progress Notes (Signed)
Progress Note    Briana Sweeney  JAS:[id number] DOB: 03/06/1971  DOA: 07/19/2017 PCP: Patient, No Pcp Per    Brief Narrative:   Chief complaint: Follow-up ascites/liver disease  Medical records reviewed and are as summarized below:  Briana Sweeney is an 46 y.o. female with a PMH of alcohol abuse, untreated hepatitis C, cirrhosis of the liver and seizure disorder who was admitted 07/19/17 for evaluation of a 2 day history of increased abdominal swelling associated with shortness of breath. Upon initial evaluation, the patient was noted to be tachypneic and tachycardic. She was hyponatremic with a sodium of 128 and had elevated bilirubin of 10.1. She was coagulopathic with a PT/INR 1.63. Diagnostic paracentesis was performed due to concerns for SBP and she was placed on empiric ceftriaxone.  Assessment/Plan:   Principal Problem:   Decompensated hepatic cirrhosis (HCC) versus alcoholic hepatitis/jaundice/ascites No evidence of SBP status post paracentesis. Started on Lasix and Aldactone with fluid restriction of 1.5 L. GI following. Steroids not currently recommended given active hallucinations. Meld score 23 with discriminate function score 37.7. Counseled on the need for absolute abstinence from alcohol. Poor long-term prognosis given meld score.  Active Problems:   Abdominal distention/constipation/rule out small bowel obstruction Plain films done today showed dilated loops of bowel. The patient denies active vomiting. Felt better after being given a dose of lactulose and had a small BM. Diet downgraded to clear liquids. CT ordered for better visualization and to rule out SBO.    Alcohol abuse/DTs Continue Librium detox protocol. Medication seeking and wants extra medications for sleep and for pain. Patient is not tremulous but does report some visual hallucinations. Trental discontinued 07/23/17. Avoid increasing narcotics or other psychotropic medications. Continue folic acid  supplementation.    Hepatitis C antibody-positive (HCC) Negative hepatitis B surface antigen. Positive HCV antibody. HCV PCR undetectable.    Hypokalemia Repleted.    Coagulopathy ([place])  Monitor for signs of bleeding.    Hypomagnesemia Repleted.    Pancytopenia ([place]) Likely from the toxic effects of alcohol and the bone marrow. All cell lines depressed. Monitor CBC.    Bipolar Continue Topamax and BuSpar.  Family Communication/Anticipated D/C date and plan/Code Status   DVT prophylaxis: SCDs ordered. Code Status: Full Code.  Family Communication: Daughter updated at the bedside. Disposition Plan: Home in the next 24-48 hours if she remains stable.   Medical Consultants:    Gastroenterology   Anti-Infectives:    None, empiric Rocephin discontinued.  Subjective:   Reports that she feels cold, has mild muscle tremors, still unable to sleep, still with abdominal pain and no BM x 3 weeks. Abdominal pain is sharp in quality. Passing "very little" gas.   Objective:    Vitals:   07/23/17 1530 07/23/17 1929 07/24/17 0023 07/24/17 0438  BP: (!) 88/58 96/62 (!) 89/50 (!) 88/64  Pulse:  95 99 91  Resp: 19 19 20  (!) 23  Temp: 98.3 F (36.8 C) 98 F (36.7 C) 98.6 F (37 C) 98.2 F (36.8 C)  TempSrc: Oral Oral Oral Oral  SpO2: 98%   97%  Weight:    64.6 kg (142 lb 6.7 oz)  Height:       No intake or output data in the 24 hours ending 07/24/17 0836 Filed Weights   07/20/17 2049 07/23/17 0450 07/24/17 0438  Weight: 66 kg (145 lb 6.4 oz) 66.1 kg (145 lb 11.2 oz) 64.6 kg (142 lb 6.7 oz)    Exam: General: No acute distress.  Cardiovascular: Heart sounds show a regular rate, and rhythm. No gallops or rubs. No murmurs. No JVD. Lungs: Coarse. Good air movement. Abdomen: Distended/diffusely tender but no rebound or guarding. No masses. + splenomegaly. Neurological: Alert and oriented 3. Moves all extremities 4 with equal strength. Cranial nerves II through XII grossly  intact. Skin: Icteric. Warm and dry. No rashes or lesions. Extremities: No clubbing or cyanosis. 2+ edema. Pedal pulses 2+. Psychiatric: Mood and affect are depressed/flat. Insight and judgment are poor. Wearing dramatic/exaggerated makeup.  Data Reviewed:   I have personally reviewed following labs and imaging studies:  Labs: Labs show the following:   Basic Metabolic Panel: Recent Labs  Lab 07/20/17 0300 07/21/17 0314 07/22/17 0304 07/23/17 0324 07/24/17 0443  NA 129* 135 135 137 138  K 2.8* 3.7 3.4* 3.4* 4.0  CL 98* 108 108 109 103  CO2 22 19* 22 22 27   GLUCOSE 102* 89 90 95 183*  BUN <5* <5* <5* <5* 7  CREATININE 0.63 0.59 0.63 0.77 0.91  CALCIUM 7.2* 7.6* 8.1* 8.8* 8.4*  MG 1.5*  --  1.7  --   --    GFR Estimated Creatinine Clearance: 65.4 mL/min (by C-G formula based on SCr of 0.91 mg/dL). Liver Function Tests: Recent Labs  Lab 07/19/17 1645 07/20/17 0300 07/21/17 0314 07/22/17 0304 07/24/17 0443  AST 103* 85* 71* 63* 15  ALT 22 18 19 17  10*  ALKPHOS 204* 182* 145* 153* 78  BILITOT 10.1* 9.4* 9.5* 9.2* 0.9  PROT 8.1 7.6 7.6 7.2 6.1*  ALBUMIN 2.2* 2.0* 2.5* 2.2* 2.9*    Recent Labs  Lab 07/19/17 1812  AMMONIA 39*   Coagulation profile Recent Labs  Lab 07/20/17 0300 07/21/17 0314 07/22/17 0304 07/23/17 0324 07/24/17 0553  INR 1.68 1.82 1.88 2.11 2.09    CBC: Recent Labs  Lab 07/19/17 1645  07/20/17 0300 07/21/17 0314 07/21/17 0846 07/22/17 0304 07/23/17 0324 07/24/17 0553  WBC 5.6  --  4.8 2.8* 4.3 3.7* 3.1* 3.2*  NEUTROABS 4.1  --  2.8  --   --  1.9 1.3*  --   HGB 11.7*  --  11.2* 10.1* 10.7* 10.8* 9.1* 10.0*  HCT 33.6*  --  31.6* 30.0* 32.3* 32.0* 26.6* 29.6*  MCV 93.6  --  94.0 98.7 98.8 97.9 96.0 97.7  PLT PLATELET CLUMPS NOTED ON SMEAR, COUNT APPEARS DECREASED   < > 48* PLATELET CLUMPS NOTED ON SMEAR, COUNT APPEARS DECREASED PLATELET CLUMPING, SUGGEST RECOLLECTION OF SAMPLE IN CITRATE TUBE. PLATELET CLUMPING, SUGGEST RECOLLECTION  OF SAMPLE IN CITRATE TUBE. PLATELET CLUMPING, SUGGEST RECOLLECTION OF SAMPLE IN CITRATE TUBE. PLATELET CLUMPS NOTED ON SMEAR, UNABLE TO ESTIMATE   < > = values in this interval not displayed.   Sepsis Labs: Recent Labs  Lab 07/19/17 2220  07/21/17 0846 07/22/17 0304 07/23/17 0324 07/24/17 0553  WBC  --    < > 4.3 3.7* 3.1* 3.2*  LATICACIDVEN 1.0  --   --   --   --   --    < > = values in this interval not displayed.    Microbiology Recent Results (from the past 240 hour(s))  Body fluid culture ( includes gram stain)     Status: None   Collection Time: 07/19/17  7:57 PM  Result Value Ref Range Status   Specimen Description FLUID PERITONEAL CAVITY  Final   Special Requests Normal  Final   Gram Stain   Final    WBC PRESENT, PREDOMINANTLY MONONUCLEAR NO ORGANISMS SEEN  CYTOSPIN SMEAR    Culture NO GROWTH 3 DAYS  Final   Report Status 07/23/2017 FINAL  Final    Procedures and diagnostic studies:  07/24/17:2 views of the abdomen: My independent review of the image shows: Dilated loops of bowel. No transition point visible. 07/23/17 Ir Paracentesis: Successful ultrasound-guided paracentesis yielding 0.7 liters of peritoneal fluid.  07/20/17 Ir Paracentesis: Successful ultrasound-guided paracentesis yielding 1 liters of peritoneal fluid. 07/20/17 US Abdomen complete: Cirrhotic appearance of the liver with moderate amount of ascites. Portal vein is patent with normal direction of flow. Edematous gallbladder wall measuring up to 6.4 mm. This may be due to cholecystitis or secondary to presence of abdominal ascites. Gallbladder sludge. Mild splenomegaly. 07/19/17: PCXR: Patchy atelectasis or scar at the left base. No evidence of pneumonia or CHF.   Medications:   . busPIRone  15 mg Oral TID  . chlordiazePOXIDE  25 mg Oral BH-qamhs   Followed by  . chlordiazePOXIDE  25 mg Oral Daily  . folic acid  1 mg Oral Daily  . furosemide  20 mg Oral Daily  . multivitamin with minerals  1  tablet Oral Daily  . pantoprazole  40 mg Oral QHS  . phenytoin  300 mg Oral QHS  . potassium chloride  40 mEq Oral Once  . spironolactone  50 mg Oral Daily  . thiamine  100 mg Oral Daily  . topiramate  25 mg Oral BID  . traZODone  100 mg Oral QHS   Continuous Infusions:   LOS: 5 days   RAMA,CHRISTINA  [facility] Pager [phone]. If unable to reach me by pager, please call my cell phone at [phone].  *Please refer to [email or web address], password [email or web address] to get updated schedule on who will round on this patient, as hospitalists switch teams weekly. If 7PM-7AM, please contact night-coverage at [email or web address], password [email or web address] for any overnight needs.  07/24/2017, 8:36 AM

## 2017-07-24 NOTE — Progress Notes (Addendum)
[facility] Gastroenterology Progress Note  Briana Sweeney 46 y.o. 11-Apr-1971  CC:  Cirrhosis with ascites, jaundice   Subjective: Patient seen and examined at bedside. Complaining of difficulty breathing this morning. Also Complaining of ongoing  mild abdominal discomfort. Denied any nausea or vomiting. C/o  of constipation with no bowel movement despite use of lactulose  ROS : Negative for chest pain , positive for  shortness of breath   Objective: Vital signs in last 24 hours: Vitals:   07/24/17 0023 07/24/17 0438  BP: (!) 89/50 (!) 88/64  Pulse: 99 91  Resp: 20 (!) 23  Temp: 98.6 F (37 C) 98.2 F (36.8 C)  SpO2:  97%    Physical Exam:  General:  Alert and oriented 2.      Eyes:  Scleral icterus noted   Lungs:   Clear to auscultation bilaterally, respirations unlabored.   Heart:  Regular rate and rhythm, S1, S2 normal  Abdomen:   Mildly distended abdomen with generalized tenderness on palpation, no peritoneal signs bowel sounds active all four quadrants,  no masses,   Extremities: Extremities normal, atraumatic, 1 +  edema       Lab Results: Recent Labs    07/22/17 0304 07/23/17 0324 07/24/17 0443  NA 135 137 138  K 3.4* 3.4* 4.0  CL 108 109 103  CO2 22 22 27   GLUCOSE 90 95 183*  BUN <5* <5* 7  CREATININE 0.63 0.77 0.91  CALCIUM 8.1* 8.8* 8.4*  MG 1.7  --   --    Recent Labs    07/22/17 0304 07/24/17 0443  AST 63* 15  ALT 17 10*  ALKPHOS 153* 78  BILITOT 9.2* 0.9  PROT 7.2 6.1*  ALBUMIN 2.2* 2.9*   Recent Labs    07/22/17 0304 07/23/17 0324 07/24/17 0553  WBC 3.7* 3.1* 3.2*  NEUTROABS 1.9 1.3*  --   HGB 10.8* 9.1* 10.0*  HCT 32.0* 26.6* 29.6*  MCV 97.9 96.0 97.7  PLT PLATELET CLUMPING, SUGGEST RECOLLECTION OF SAMPLE IN CITRATE TUBE. PLATELET CLUMPING, SUGGEST RECOLLECTION OF SAMPLE IN CITRATE TUBE. PLATELET CLUMPS NOTED ON SMEAR, UNABLE TO ESTIMATE   Recent Labs    07/23/17 0324 07/24/17 0553  LABPROT 23.4* 23.3*  INR 2.11 2.09       Assessment/Plan: - Decompensated cirrhosis with ascites. Status post paracentesis with fluid analysis is negative for SBP 07/20/2017  - Jaundice. Could be from decompensated cirrhosis versus alcoholic hepatitis. - History of hepatitis C infection. Negative HCV PCR. Most likely she has cleared her hepatitis C. - Thrombocytopenia with platelet counts in 40s - History of seizure disorder - Alcohol withdrawal - Confusion with hallucinations : Resolved - Constipation  Recommendations ------------------------- - X-ray abdomen for follow-up on abdominal distention and constipation - Repeat CMP as patient looks icteric but hepatic panel is surprisingly normal  -  MELD score of 23 and  discriminant function score of 46.9 as of yesterday  - Given her history of seizure disorder, I would avoid use of steroids. - Continue pentoxifylline for now. Consider DC pentoxifylline if no improvement in bilirubin in next 5 To 7 days  - Continue lactulose for constipation.  - Absolute alcohol abstinence recommended  -  negative hepatitis B surface antigen. Positive HCV antibody with negative HCV PCR   - Monitor daily LFTs and INR. - Avoid narcotics if possible - Long-term prognosis poor based on elevated meld score. - GI will follow     Otis Brace MD, [place] 07/24/2017, 10:15 AM  Contact #  [phone] 

## 2017-07-24 NOTE — Clinical Social Work Note (Signed)
Clinical Social Work Assessment  Patient Details  Name: Briana Sweeney MRN: [id number] Date of Birth: 10/03/1970  Date of referral:  07/24/17               Reason for consult:  Discharge Planning, Housing Concerns/Homelessness                Permission sought to share information with:  Family Supports Permission granted to share information::  Yes, Verbal Permission Granted  Name::     Cheri Rayle  Agency::     Relationship::  [phone]  Contact Information:  sister  Housing/Transportation Living arrangements for the past 2 months:  Single Family Home Source of Information:  Patient Patient Interpreter Needed:  None Criminal Activity/Legal Involvement Pertinent to Current Situation/Hospitalization:    Significant Relationships:    Lives with:    Do you feel safe going back to the place where you live?  Yes Need for family participation in patient care:  Yes (Comment)  Care giving concerns:  Patient had sister and other family members coming in and out of room  Social Worker assessment / plan:  CSW met patient at bedside with sister present. Patient stated she will discharge to a friends house in [place]. Patient stated at this time she does not want to discuss her substance abuse problem but would be open to resources that can assist patient. Patient stated she would like to visit with her sister and spend time with her. CSW signing off as patient no longer has social work needs.  Employment status:  Unemployed Insurance information:  Self Pay (Medicaid Pending) PT Recommendations:  Not assessed at this time Information / Referral to community resources:  Outpatient Substance Abuse Treatment Options  Patient/Family's Response to care:  Family seems to be very supportive of patient  Patient/Family's Understanding of and Emotional Response to Diagnosis, Current Treatment, and Prognosis:  Patient stated she has a safe place to go and does not need assistance getting  there Emotional Assessment Appearance:  Appears stated age Attitude/Demeanor/Rapport:  Other Affect (typically observed):  Withdrawn Orientation:  Oriented to Situation, Oriented to  Time, Oriented to Place, Oriented to Self Alcohol / Substance use:    Psych involvement (Current and /or in the community):     Discharge Needs  Concerns to be addressed:  Substance Abuse Concerns Readmission within the last 30 days:  No Current discharge risk:  None Barriers to Discharge:  No Barriers Identified    C , LCSW 07/24/2017, 12:54 PM  

## 2017-07-25 DIAGNOSIS — R140 Abdominal distension (gaseous): Secondary | ICD-10-CM | POA: Diagnosis not present

## 2017-07-25 DIAGNOSIS — F1010 Alcohol abuse, uncomplicated: Secondary | ICD-10-CM | POA: Diagnosis not present

## 2017-07-25 DIAGNOSIS — K7290 Hepatic failure, unspecified without coma: Secondary | ICD-10-CM | POA: Diagnosis not present

## 2017-07-25 DIAGNOSIS — R1084 Generalized abdominal pain: Secondary | ICD-10-CM | POA: Diagnosis not present

## 2017-07-25 LAB — BASIC METABOLIC PANEL
Anion gap: 6 (ref 5–15)
BUN: <5 mg/dL — ABNORMAL LOW (ref 6–20)
CO2: 21 mmol/L — ABNORMAL LOW (ref 22–32)
Calcium: 8.4 mg/dL — ABNORMAL LOW (ref 8.9–10.3)
Chloride: 109 mmol/L (ref 101–111)
Creatinine, Ser: 0.66 mg/dL (ref 0.44–1.00)
GFR calc Af Amer: >60 mL/min (ref >60)
GFR calc non Af Amer: >60 mL/min (ref >60)
Glucose, Bld: 96 mg/dL (ref 65–99)
Potassium: 3.4 mmol/L — ABNORMAL LOW (ref 3.5–5.1)
Sodium: 136 mmol/L (ref 135–145)

## 2017-07-25 LAB — HEPATIC FUNCTION PANEL
ALT: 13 U/L — ABNORMAL LOW (ref 14–54)
AST: 43 U/L — ABNORMAL HIGH (ref 15–41)
Albumin: 2.5 g/dL — ABNORMAL LOW (ref 3.5–5.0)
Alkaline Phosphatase: 100 U/L (ref 38–126)
Bilirubin, Direct: 4.2 mg/dL — ABNORMAL HIGH (ref 0.1–0.5)
Indirect Bilirubin: 3.7 mg/dL — ABNORMAL HIGH (ref 0.3–0.9)
Total Bilirubin: 7.9 mg/dL — ABNORMAL HIGH (ref 0.3–1.2)
Total Protein: 7 g/dL (ref 6.5–8.1)

## 2017-07-25 LAB — CBC
HCT: 29.6 % — ABNORMAL LOW (ref 36.0–46.0)
Hemoglobin: 10 g/dL — ABNORMAL LOW (ref 12.0–15.0)
MCH: 32.6 pg (ref 26.0–34.0)
MCHC: 33.8 g/dL (ref 30.0–36.0)
MCV: 96.4 fL (ref 78.0–100.0)
RBC: 3.07 MIL/uL — ABNORMAL LOW (ref 3.87–5.11)
RDW: 22 % — ABNORMAL HIGH (ref 11.5–15.5)
WBC: 3.4 10*3/uL — ABNORMAL LOW (ref 4.0–10.5)

## 2017-07-25 MED ORDER — POTASSIUM CHLORIDE CRYS ER 20 MEQ PO TBCR
40.0000 meq | EXTENDED_RELEASE_TABLET | Freq: Once | ORAL | Status: AC
  Administered 2017-07-25: 40 meq via ORAL
  Filled 2017-07-25: qty 2

## 2017-07-25 MED ORDER — PENTOXIFYLLINE ER 400 MG PO TBCR
400.0000 mg | EXTENDED_RELEASE_TABLET | Freq: Three times a day (TID) | ORAL | Status: DC
  Administered 2017-07-25 – 2017-07-28 (×10): 400 mg via ORAL
  Filled 2017-07-25 (×10): qty 1

## 2017-07-25 MED ORDER — POLYETHYLENE GLYCOL 3350 17 G PO PACK
17.0000 g | PACK | Freq: Every day | ORAL | Status: DC
  Administered 2017-07-25: 17 g via ORAL
  Filled 2017-07-25 (×2): qty 1

## 2017-07-25 MED ORDER — SODIUM CHLORIDE 0.9 % IV BOLUS (SEPSIS)
500.0000 mL | Freq: Once | INTRAVENOUS | Status: AC
  Administered 2017-07-25: 500 mL via INTRAVENOUS

## 2017-07-25 MED ORDER — PANTOPRAZOLE SODIUM 40 MG PO TBEC
40.0000 mg | DELAYED_RELEASE_TABLET | Freq: Every day | ORAL | Status: DC
  Administered 2017-07-26 – 2017-07-28 (×3): 40 mg via ORAL
  Filled 2017-07-25 (×3): qty 1

## 2017-07-25 NOTE — Progress Notes (Signed)
[facility] Gastroenterology Progress Note  Briana Sweeney 46 y.o. 05-09-1971  CC:  Cirrhosis with ascites, jaundice   Subjective: Patient seen and examined at bedside. Had a small bowel movement this morning. Abdominal pain improving but continues to have abdominal distention. Denied any nausea and vomiting.  ROS : Negative for chest pain . Shortness of breath improving   Objective: Vital signs in last 24 hours: Vitals:   07/25/17 0513 07/25/17 0800  BP: (!) 87/64 91/66  Pulse:  94  Resp: (!) 21 20  Temp:  98.3 F (36.8 C)  SpO2:  93%    Physical Exam:  General:  Alert and oriented 2.      Eyes:  Scleral icterus noted   Lungs:   Clear to auscultation bilaterally, respirations unlabored.   Heart:  Regular rate and rhythm, S1, S2 normal  Abdomen:   Mildly distended abdomen with generalized tenderness on palpation, no peritoneal signs bowel sounds active all four quadrants,  no masses,   Extremities: Extremities normal, atraumatic, 1 +  edema       Lab Results: Recent Labs    07/24/17 1257 07/25/17 0301  NA 137 136  K 3.1* 3.4*  CL 110 109  CO2 18* 21*  GLUCOSE 110* 96  BUN <5* <5*  CREATININE 0.78 0.66  CALCIUM 8.7* 8.4*   Recent Labs    07/24/17 1257 07/25/17 0301  AST 52* 43*  ALT 14 13*  ALKPHOS 109 100  BILITOT 8.5* 7.9*  PROT 7.2 7.0  ALBUMIN 2.9* 2.5*   Recent Labs    07/23/17 0324 07/24/17 0553 07/25/17 0301  WBC 3.1* 3.2* 3.4*  NEUTROABS 1.3*  --   --   HGB 9.1* 10.0* 10.0*  HCT 26.6* 29.6* 29.6*  MCV 96.0 97.7 96.4  PLT PLATELET CLUMPING, SUGGEST RECOLLECTION OF SAMPLE IN CITRATE TUBE. PLATELET CLUMPS NOTED ON SMEAR, UNABLE TO ESTIMATE PLATELET CLUMPING, SUGGEST RECOLLECTION OF SAMPLE IN CITRATE TUBE.   Recent Labs    07/23/17 0324 07/24/17 0553  LABPROT 23.4* 23.3*  INR 2.11 2.09      Assessment/Plan: - Decompensated cirrhosis with ascites. Status post paracentesis with fluid analysis is negative for SBP 07/20/2017 .  MELD score of  23 and  discriminant function score of 45.2 as of 07/25/2017  - Jaundice. Could be from decompensated cirrhosis versus alcoholic hepatitis. - History of hepatitis C infection. Negative HCV PCR. Most likely she has cleared her hepatitis C. - Thrombocytopenia with platelet counts in 40s - History of seizure disorder - Alcohol withdrawal - Confusion with hallucinations : Resolved - Constipation  Recommendations ------------------------- - CT abdomen pelvis with IV contrast yesterday showed hepatic steatosis but no acute changes or no small bowel obstruction.   -  LFTs trending down. Continue pentoxifylline for now. - Currently on low-dose diuretics with spironolactone 50 mg and Lasix 20 mg.   - Continue lactulose for constipation. Add MiraLAX  - Absolute alcohol abstinence recommended  -  negative hepatitis B surface antigen. Positive HCV antibody with negative HCV PCR   - Monitor CMP and INR periodically. - Avoid narcotics if possible - Long-term prognosis poor based on elevated meld score. - Given her history of seizure disorder, I would avoid use of steroids. - GI will follow periodically    Otis Brace MD, FACP 07/25/2017, 10:59 AM  Contact #  [phone]

## 2017-07-25 NOTE — Progress Notes (Signed)
Notified MD of low bp 

## 2017-07-25 NOTE — Care Management Note (Signed)
Case Management Note  Patient Details  Name: Briana Sweeney MRN: [id number] Date of Birth: Feb 13, 1971  Subjective/Objective:         Pt presented for hepatic cirrhosis.  Pt uninsured and no PCP.    Pt recently moved here from [place] where she utilized a community clinic.       Action/Plan: Pt prefers to come to [facility] for PCP and states she has transportation.  Appointment made for hospital f/u with [facility].  Pt may use [facility] pharmacy for medication needs Mon-Friday.  Expected Discharge Date:                  Expected Discharge Plan:  Home/Self Care  In-House Referral:  Clinical Social Work  Discharge planning Services  CM Consult  Post Acute Care Choice:   N/A Choice offered to:     DME Arranged:   N/A DME Agency:   N/A  HH Arranged:   N/A HH Agency:   N/A  Status of Service:  In process, will continue to follow  If discussed at Long Length of Stay Meetings, dates discussed:    Additional Comments:  Arley Phenix, RN 07/25/2017, 3:24 PM

## 2017-07-25 NOTE — Progress Notes (Signed)
Progress Note    Briana Sweeney  JKD:[id number] DOB: Mar 21, 1971  DOA: 07/19/2017 PCP: Patient, No Pcp Per    Brief Narrative:   Chief complaint: Follow-up ascites/liver disease  Medical records reviewed and are as summarized below:  Briana Sweeney is an 46 y.o. female with a PMH of alcohol abuse, untreated hepatitis C, cirrhosis of the liver and seizure disorder who was admitted 07/19/17 for evaluation of a 2 day history of increased abdominal swelling associated with shortness of breath. Upon initial evaluation, the patient was noted to be tachypneic and tachycardic. She was hyponatremic with a sodium of 128 and had elevated bilirubin of 10.1. She was coagulopathic with a PT/INR 1.63. Diagnostic paracentesis was performed due to concerns for SBP and she was placed on empiric ceftriaxone.  Assessment/Plan:   Principal Problem:   Decompensated hepatic cirrhosis (HCC) versus alcoholic hepatitis/jaundice/ascites No evidence of SBP status post paracentesis. Started on Lasix and Aldactone with fluid restriction of 1.5 L. GI following. Steroids not currently recommended given active hallucinations. Meld score 23 with discriminate function score 37.7. Counseled on the need for absolute abstinence from alcohol. Poor long-term prognosis given meld score. Does not appear to be encephalopathic. Will resume Trental.  Active Problems:   Abdominal distention/constipation/rule out small bowel obstruction Plain films done 07/24/17 and showed dilated loops of bowel. Diet downgraded to clear liquids. CT negative for SBO, but did show hepatic steatosis. Abdomen still extremely distended. GI added MiraLAX, continue lactulose.    Alcohol abuse/DTs Continue Librium detox protocol. Continue folic acid supplementation. No active DTs.    Hepatitis C antibody-positive (HCC) Negative hepatitis B surface antigen. Positive HCV antibody. HCV PCR undetectable.    Hypokalemia Continue to replete.    Coagulopathy  ([place])  Monitor for signs of bleeding.    Hypomagnesemia Repleted.    Pancytopenia ([place]) Likely from the toxic effects of alcohol and the bone marrow. All cell lines depressed. Monitor CBC.    Bipolar Continue Topamax and BuSpar.  Family Communication/Anticipated D/C date and plan/Code Status   DVT prophylaxis: SCDs ordered. Code Status: Full Code.  Family Communication: Daughter updated at the bedside. Disposition Plan: Home when abdominal distention improved.   Medical Consultants:    Gastroenterology   Anti-Infectives:    None, empiric Rocephin discontinued.  Subjective:   Is having a lot of abdominal discomfort and bloating. Bowels only moving in small amounts. Wants to eat.  Objective:    Vitals:   07/25/17 0200 07/25/17 0400 07/25/17 0513 07/25/17 0800  BP: (!) 71/46 (!) 82/59 (!) 87/64 91/66  Pulse: 95 99  94  Resp: 18 19 (!) 21 20  Temp:  98.1 F (36.7 C)  98.3 F (36.8 C)  TempSrc:  Oral  Oral  SpO2: 95% 99%  93%  Weight:  65.3 kg (144 lb)    Height:        Intake/Output Summary (Last 24 hours) at 07/25/2017 0835 Last data filed at 07/25/2017 0412 Gross per 24 hour  Intake 1100 ml  Output 1 ml  Net 1099 ml   Filed Weights   07/23/17 0450 07/24/17 0438 07/25/17 0400  Weight: 66.1 kg (145 lb 11.2 oz) 64.6 kg (142 lb 6.7 oz) 65.3 kg (144 lb)    Exam: General: Mild distress. Cardiovascular: Heart sounds show a regular rate, and rhythm. No gallops or rubs. No murmurs. No JVD. Lungs: Decreased breath sounds. No rales, rhonchi or wheezes. Abdomen: Extremely distended with diffuse tenderness. No masses. No hepatosplenomegaly. Neurological: Alert  and oriented 3. Moves all extremities 4 with equal strength. Cranial nerves II through XII grossly intact. Skin: Warm and dry. No rashes or lesions. Extremities: No clubbing or cyanosis. 2+ edema. Pedal pulses 2+. Psychiatric: Mood and affect are depressed/black. Insight and judgment are  poor.   Data Reviewed:   I have personally reviewed following labs and imaging studies:  Labs: Labs show the following:   Basic Metabolic Panel: Recent Labs  Lab 07/20/17 0300  07/22/17 0304 07/23/17 0324 07/24/17 0443 07/24/17 1257 07/25/17 0301  NA 129*   < > 135 137 QUESTIONABLE RESULTS, RECOMMEND RECOLLECT TO VERIFY 137 136  K 2.8*   < > 3.4* 3.4* QUESTIONABLE RESULTS, RECOMMEND RECOLLECT TO VERIFY 3.1* 3.4*  CL 98*   < > 108 109 QUESTIONABLE RESULTS, RECOMMEND RECOLLECT TO VERIFY 110 109  CO2 22   < > 22 22 QUESTIONABLE RESULTS, RECOMMEND RECOLLECT TO VERIFY 18* 21*  GLUCOSE 102*   < > 90 95 QUESTIONABLE RESULTS, RECOMMEND RECOLLECT TO VERIFY 110* 96  BUN <5*   < > <5* <5* QUESTIONABLE RESULTS, RECOMMEND RECOLLECT TO VERIFY <5* <5*  CREATININE 0.63   < > 0.63 0.77 QUESTIONABLE RESULTS, RECOMMEND RECOLLECT TO VERIFY 0.78 0.66  CALCIUM 7.2*   < > 8.1* 8.8* QUESTIONABLE RESULTS, RECOMMEND RECOLLECT TO VERIFY 8.7* 8.4*  MG 1.5*  --  1.7  --   --   --   --    < > = values in this interval not displayed.   GFR Estimated Creatinine Clearance: 74.9 mL/min (by C-G formula based on SCr of 0.66 mg/dL). Liver Function Tests: Recent Labs  Lab 07/20/17 0300 07/21/17 0314 07/22/17 0304 07/24/17 0443 07/24/17 1257  AST 85* 71* 63* QUESTIONABLE RESULTS, RECOMMEND RECOLLECT TO VERIFY 52*  ALT 18 19 17  QUESTIONABLE RESULTS, RECOMMEND RECOLLECT TO VERIFY 14  ALKPHOS 182* 145* 153* QUESTIONABLE RESULTS, RECOMMEND RECOLLECT TO VERIFY 109  BILITOT 9.4* 9.5* 9.2* QUESTIONABLE RESULTS, RECOMMEND RECOLLECT TO VERIFY 8.5*  PROT 7.6 7.6 7.2 QUESTIONABLE RESULTS, RECOMMEND RECOLLECT TO VERIFY 7.2  ALBUMIN 2.0* 2.5* 2.2* QUESTIONABLE RESULTS, RECOMMEND RECOLLECT TO VERIFY 2.9*    Recent Labs  Lab 07/19/17 1812  AMMONIA 39*   Coagulation profile Recent Labs  Lab 07/20/17 0300 07/21/17 0314 07/22/17 0304 07/23/17 0324 07/24/17 0553  INR 1.68 1.82 1.88 2.11 2.09    CBC: Recent  Labs  Lab 07/19/17 1645  07/20/17 0300  07/21/17 0846 07/22/17 0304 07/23/17 0324 07/24/17 0553 07/25/17 0301  WBC 5.6  --  4.8   < > 4.3 3.7* 3.1* 3.2* 3.4*  NEUTROABS 4.1  --  2.8  --   --  1.9 1.3*  --   --   HGB 11.7*  --  11.2*   < > 10.7* 10.8* 9.1* 10.0* 10.0*  HCT 33.6*  --  31.6*   < > 32.3* 32.0* 26.6* 29.6* 29.6*  MCV 93.6  --  94.0   < > 98.8 97.9 96.0 97.7 96.4  PLT PLATELET CLUMPS NOTED ON SMEAR, COUNT APPEARS DECREASED   < > 48*   < > PLATELET CLUMPING, SUGGEST RECOLLECTION OF SAMPLE IN CITRATE TUBE. PLATELET CLUMPING, SUGGEST RECOLLECTION OF SAMPLE IN CITRATE TUBE. PLATELET CLUMPING, SUGGEST RECOLLECTION OF SAMPLE IN CITRATE TUBE. PLATELET CLUMPS NOTED ON SMEAR, UNABLE TO ESTIMATE PLATELET CLUMPING, SUGGEST RECOLLECTION OF SAMPLE IN CITRATE TUBE.   < > = values in this interval not displayed.   Sepsis Labs: Recent Labs  Lab 07/19/17 2220  07/22/17 0304 07/23/17 0324 07/24/17 6811  07/25/17 0301  WBC  --    < > 3.7* 3.1* 3.2* 3.4*  LATICACIDVEN 1.0  --   --   --   --   --    < > = values in this interval not displayed.    Microbiology Recent Results (from the past 240 hour(s))  Body fluid culture ( includes gram stain)     Status: None   Collection Time: 07/19/17  7:57 PM  Result Value Ref Range Status   Specimen Description FLUID PERITONEAL CAVITY  Final   Special Requests Normal  Final   Gram Stain   Final    WBC PRESENT, PREDOMINANTLY MONONUCLEAR NO ORGANISMS SEEN CYTOSPIN SMEAR    Culture NO GROWTH 3 DAYS  Final   Report Status 07/23/2017 FINAL  Final    Procedures and diagnostic studies:  07/24/17: CT of the abdomen and pelvis with contrast: My independent review of the image shows: No small bowel obstruction visualized. Hepatitis/hepatic steatosis, ascites.  07/24/17:2 views of the abdomen: My independent review of the image shows: Dilated loops of bowel. No transition point visible. 07/23/17 Ir Paracentesis: Successful ultrasound-guided  paracentesis yielding 0.7 liters of peritoneal fluid.  07/20/17 Ir Paracentesis: Successful ultrasound-guided paracentesis yielding 1 liters of peritoneal fluid. 07/20/17 US Abdomen complete: Cirrhotic appearance of the liver with moderate amount of ascites. Portal vein is patent with normal direction of flow. Edematous gallbladder wall measuring up to 6.4 mm. This may be due to cholecystitis or secondary to presence of abdominal ascites. Gallbladder sludge. Mild splenomegaly. 07/19/17: PCXR: Patchy atelectasis or scar at the left base. No evidence of pneumonia or CHF.   Medications:   . busPIRone  15 mg Oral TID  . folic acid  1 mg Oral Daily  . furosemide  20 mg Oral Daily  . lactulose  20 g Oral Q6H  . multivitamin with minerals  1 tablet Oral Daily  . pantoprazole  40 mg Oral QHS  . phenytoin  300 mg Oral QHS  . potassium chloride  40 mEq Oral Once  . spironolactone  50 mg Oral Daily  . thiamine  100 mg Oral Daily  . topiramate  25 mg Oral BID  . traZODone  100 mg Oral QHS   Continuous Infusions:   LOS: 6 days   Jacquelynn Cree  [facility] Pager [phone]. If unable to reach me by pager, please call my cell phone at [phone].  *Please refer to [email or web address], password [email or web address] to get updated schedule on who will round on this patient, as hospitalists switch teams weekly. If 7PM-7AM, please contact night-coverage at [email or web address], password [email or web address] for any overnight needs.  07/25/2017, 8:35 AM

## 2017-07-26 DIAGNOSIS — R140 Abdominal distension (gaseous): Secondary | ICD-10-CM | POA: Diagnosis not present

## 2017-07-26 DIAGNOSIS — F1010 Alcohol abuse, uncomplicated: Secondary | ICD-10-CM | POA: Diagnosis not present

## 2017-07-26 DIAGNOSIS — K7290 Hepatic failure, unspecified without coma: Secondary | ICD-10-CM | POA: Diagnosis not present

## 2017-07-26 DIAGNOSIS — K7031 Alcoholic cirrhosis of liver with ascites: Secondary | ICD-10-CM | POA: Diagnosis not present

## 2017-07-26 LAB — BASIC METABOLIC PANEL
Anion gap: 7 (ref 5–15)
BUN: <5 mg/dL — ABNORMAL LOW (ref 6–20)
CO2: 21 mmol/L — ABNORMAL LOW (ref 22–32)
Calcium: 8.5 mg/dL — ABNORMAL LOW (ref 8.9–10.3)
Chloride: 108 mmol/L (ref 101–111)
Creatinine, Ser: 0.64 mg/dL (ref 0.44–1.00)
GFR calc Af Amer: >60 mL/min (ref >60)
GFR calc non Af Amer: >60 mL/min (ref >60)
Glucose, Bld: 93 mg/dL (ref 65–99)
Potassium: 3.8 mmol/L (ref 3.5–5.1)
Sodium: 136 mmol/L (ref 135–145)

## 2017-07-26 MED ORDER — TRAMADOL HCL 50 MG PO TABS
50.0000 mg | ORAL_TABLET | Freq: Once | ORAL | Status: AC
  Administered 2017-07-26: 50 mg via ORAL
  Filled 2017-07-26: qty 1

## 2017-07-26 MED ORDER — ONDANSETRON HCL 4 MG/2ML IJ SOLN
4.0000 mg | Freq: Once | INTRAMUSCULAR | Status: AC
  Administered 2017-07-26: 4 mg via INTRAVENOUS
  Filled 2017-07-26: qty 2

## 2017-07-26 MED ORDER — METOCLOPRAMIDE HCL 5 MG PO TABS
5.0000 mg | ORAL_TABLET | Freq: Three times a day (TID) | ORAL | Status: DC
  Administered 2017-07-26 – 2017-07-28 (×9): 5 mg via ORAL
  Filled 2017-07-26 (×9): qty 1

## 2017-07-26 NOTE — Progress Notes (Signed)
Progress Note    Briana Sweeney  BMW:[id number] DOB: [birth year]  DOA: 07/19/2017 PCP: Patient, No Pcp Per    Brief Narrative:   Chief complaint: Follow-up ascites/liver disease  Medical records reviewed and are as summarized below:  Briana Sweeney is an 46 y.o. female with a PMH of alcohol abuse, untreated hepatitis C, cirrhosis of the liver and seizure disorder who was admitted 07/19/17 for evaluation of a 2 day history of increased abdominal swelling associated with shortness of breath. Upon initial evaluation, the patient was noted to be tachypneic and tachycardic. She was hyponatremic with a sodium of 128 and had elevated bilirubin of 10.1. She was coagulopathic with a PT/INR 1.63. Diagnostic paracentesis was performed due to concerns for SBP and she was placed on empiric ceftriaxone.  Assessment/Plan:   Principal Problem:   Decompensated hepatic cirrhosis (HCC) versus alcoholic hepatitis/jaundice/ascites No evidence of SBP status post paracentesis x 2. Started on Lasix and Aldactone with fluid restriction of 1.5 L. Continue Trental. GI following. Steroids not currently recommended given active hallucinations. Meld score 23 with discriminate function score 37.7. Counseled on the need for absolute abstinence from alcohol. Poor long-term prognosis given meld score. Reports increased abdominal swelling, so will reorder paracentesis.  Active Problems:   Abdominal distention/constipation/rule out small bowel obstruction Plain films done 07/24/17 and showed dilated loops of bowel. Diet downgraded to clear liquids. CT negative for SBO, but did show hepatic steatosis. Abdomen still extremely distended. GI added MiraLAX, continue lactulose. Will advance diet monitor slowly as tolerated.    Alcohol abuse/DTs Continue Librium detox protocol. Continue folic acid supplementation. No active DTs.    Hepatitis C antibody-positive (HCC) Negative hepatitis B surface antigen. Positive HCV antibody.  HCV PCR undetectable.    Hypokalemia Repleted.    Coagulopathy ([place])  No signs of bleeding, continue to monitor.    Hypomagnesemia Repleted.    Pancytopenia ([place]) Likely from the toxic effects of alcohol and the bone marrow. All cell lines depressed. Monitor CBC.    Bipolar Continue Topamax and BuSpar.  Family Communication/Anticipated D/C date and plan/Code Status   DVT prophylaxis: SCDs ordered. Code Status: Full Code.  Family Communication: Daughter updated at the bedside. Disposition Plan: Home when abdominal distention improved.   Medical Consultants:    Gastroenterology   Anti-Infectives:    None, empiric Rocephin discontinued.  Subjective:   Continues to report abdominal discomfort and bloating. Bowels only moving in small amounts. Had one episode of nausea/vomiting earlier today. Feels that she is retaining more fluid around the belly.  Objective:    Vitals:   07/25/17 2345 07/25/17 2356 07/26/17 0405 07/26/17 0806  BP: (!) 63/50 91/64 93/68  93/66  Pulse:  89 90 88  Resp:  (!) 21  18  Temp: 98.8 F (37.1 C)  98.6 F (37 C) (!) 97.2 F (36.2 C)  TempSrc: Oral  Oral Oral  SpO2:  95% 93% 100%  Weight:   65.3 kg (143 lb 15.4 oz)   Height:        Intake/Output Summary (Last 24 hours) at 07/26/2017 1352 Last data filed at 07/26/2017 0900 Gross per 24 hour  Intake 420 ml  Output -  Net 420 ml   Filed Weights   07/24/17 0438 07/25/17 0400 07/26/17 0405  Weight: 64.6 kg (142 lb 6.7 oz) 65.3 kg (144 lb) 65.3 kg (143 lb 15.4 oz)    Exam: (Unchanged from 07/25/17) General: Uncomfortable appearing. Cardiovascular: Heart sounds show a regular rate, and rhythm. No  gallops or rubs. No murmurs. No JVD. Lungs: Decreased breath sounds. No rales, rhonchi or wheezes. Abdomen: Extremely distended with diffuse tenderness. No masses. No hepatosplenomegaly. Neurological: Alert and oriented 3. Moves all extremities 4 with equal strength. Cranial nerves II  through XII grossly intact. Skin: Warm and dry. No rashes or lesions. Extremities: No clubbing or cyanosis. 2+ edema. Pedal pulses 2+. Psychiatric: Mood and affect are depressed/black. Insight and judgment are poor.   Data Reviewed:   I have personally reviewed following labs and imaging studies:  Labs: Labs show the following:   Basic Metabolic Panel: Recent Labs  Lab 07/20/17 0300  07/22/17 0304 07/23/17 0324 07/24/17 0443 07/24/17 1257 07/25/17 0301 07/26/17 0430  NA 129*   < > 135 137 QUESTIONABLE RESULTS, RECOMMEND RECOLLECT TO VERIFY 137 136 136  K 2.8*   < > 3.4* 3.4* QUESTIONABLE RESULTS, RECOMMEND RECOLLECT TO VERIFY 3.1* 3.4* 3.8  CL 98*   < > 108 109 QUESTIONABLE RESULTS, RECOMMEND RECOLLECT TO VERIFY 110 109 108  CO2 22   < > 22 22 QUESTIONABLE RESULTS, RECOMMEND RECOLLECT TO VERIFY 18* 21* 21*  GLUCOSE 102*   < > 90 95 QUESTIONABLE RESULTS, RECOMMEND RECOLLECT TO VERIFY 110* 96 93  BUN <5*   < > <5* <5* QUESTIONABLE RESULTS, RECOMMEND RECOLLECT TO VERIFY <5* <5* <5*  CREATININE 0.63   < > 0.63 0.77 QUESTIONABLE RESULTS, RECOMMEND RECOLLECT TO VERIFY 0.78 0.66 0.64  CALCIUM 7.2*   < > 8.1* 8.8* QUESTIONABLE RESULTS, RECOMMEND RECOLLECT TO VERIFY 8.7* 8.4* 8.5*  MG 1.5*  --  1.7  --   --   --   --   --    < > = values in this interval not displayed.   GFR Estimated Creatinine Clearance: 74.9 mL/min (by C-G formula based on SCr of 0.64 mg/dL). Liver Function Tests: Recent Labs  Lab 07/21/17 0314 07/22/17 0304 07/24/17 0443 07/24/17 1257 07/25/17 0301  AST 71* 63* QUESTIONABLE RESULTS, RECOMMEND RECOLLECT TO VERIFY 52* 43*  ALT 19 17 QUESTIONABLE RESULTS, RECOMMEND RECOLLECT TO VERIFY 14 13*  ALKPHOS 145* 153* QUESTIONABLE RESULTS, RECOMMEND RECOLLECT TO VERIFY 109 100  BILITOT 9.5* 9.2* QUESTIONABLE RESULTS, RECOMMEND RECOLLECT TO VERIFY 8.5* 7.9*  PROT 7.6 7.2 QUESTIONABLE RESULTS, RECOMMEND RECOLLECT TO VERIFY 7.2 7.0  ALBUMIN 2.5* 2.2* QUESTIONABLE  RESULTS, RECOMMEND RECOLLECT TO VERIFY 2.9* 2.5*    Recent Labs  Lab 07/19/17 1812  AMMONIA 39*   Coagulation profile Recent Labs  Lab 07/20/17 0300 07/21/17 0314 07/22/17 0304 07/23/17 0324 07/24/17 0553  INR 1.68 1.82 1.88 2.11 2.09    CBC: Recent Labs  Lab 07/19/17 1645  07/20/17 0300  07/21/17 0846 07/22/17 0304 07/23/17 0324 07/24/17 0553 07/25/17 0301  WBC 5.6  --  4.8   < > 4.3 3.7* 3.1* 3.2* 3.4*  NEUTROABS 4.1  --  2.8  --   --  1.9 1.3*  --   --   HGB 11.7*  --  11.2*   < > 10.7* 10.8* 9.1* 10.0* 10.0*  HCT 33.6*  --  31.6*   < > 32.3* 32.0* 26.6* 29.6* 29.6*  MCV 93.6  --  94.0   < > 98.8 97.9 96.0 97.7 96.4  PLT PLATELET CLUMPS NOTED ON SMEAR, COUNT APPEARS DECREASED   < > 48*   < > PLATELET CLUMPING, SUGGEST RECOLLECTION OF SAMPLE IN CITRATE TUBE. PLATELET CLUMPING, SUGGEST RECOLLECTION OF SAMPLE IN CITRATE TUBE. PLATELET CLUMPING, SUGGEST RECOLLECTION OF SAMPLE IN CITRATE TUBE. PLATELET CLUMPS NOTED ON  SMEAR, UNABLE TO ESTIMATE PLATELET CLUMPING, SUGGEST RECOLLECTION OF SAMPLE IN CITRATE TUBE.   < > = values in this interval not displayed.   Sepsis Labs: Recent Labs  Lab 07/19/17 2220  07/22/17 0304 07/23/17 0324 07/24/17 0553 07/25/17 0301  WBC  --    < > 3.7* 3.1* 3.2* 3.4*  LATICACIDVEN 1.0  --   --   --   --   --    < > = values in this interval not displayed.    Microbiology Recent Results (from the past 240 hour(s))  Body fluid culture ( includes gram stain)     Status: None   Collection Time: 07/19/17  7:57 PM  Result Value Ref Range Status   Specimen Description FLUID PERITONEAL CAVITY  Final   Special Requests Normal  Final   Gram Stain   Final    WBC PRESENT, PREDOMINANTLY MONONUCLEAR NO ORGANISMS SEEN CYTOSPIN SMEAR    Culture NO GROWTH 3 DAYS  Final   Report Status 07/23/2017 FINAL  Final    Procedures and diagnostic studies:  07/24/17: CT of the abdomen and pelvis with contrast: My independent review of the image shows: No  small bowel obstruction visualized. Hepatitis/hepatic steatosis, ascites.  07/24/17:2 views of the abdomen: My independent review of the image shows: Dilated loops of bowel. No transition point visible. 07/23/17 Ir Paracentesis: Successful ultrasound-guided paracentesis yielding 0.7 liters of peritoneal fluid.  07/20/17 Ir Paracentesis: Successful ultrasound-guided paracentesis yielding 1 liters of peritoneal fluid. 07/20/17 US Abdomen complete: Cirrhotic appearance of the liver with moderate amount of ascites. Portal vein is patent with normal direction of flow. Edematous gallbladder wall measuring up to 6.4 mm. This may be due to cholecystitis or secondary to presence of abdominal ascites. Gallbladder sludge. Mild splenomegaly. 07/19/17: PCXR: Patchy atelectasis or scar at the left base. No evidence of pneumonia or CHF.   Medications:   . busPIRone  15 mg Oral TID  . folic acid  1 mg Oral Daily  . furosemide  20 mg Oral Daily  . metoCLOPramide  5 mg Oral TID AC & HS  . multivitamin with minerals  1 tablet Oral Daily  . pantoprazole  40 mg Oral QAC breakfast  . pentoxifylline  400 mg Oral TID WC  . phenytoin  300 mg Oral QHS  . potassium chloride  40 mEq Oral Once  . spironolactone  50 mg Oral Daily  . thiamine  100 mg Oral Daily  . topiramate  25 mg Oral BID  . traZODone  100 mg Oral QHS   Continuous Infusions:   LOS: 7 days   Jacquelynn Cree  [facility] Pager [phone]. If unable to reach me by pager, please call my cell phone at [phone].  *Please refer to [email or web address], password [email or web address] to get updated schedule on who will round on this patient, as hospitalists switch teams weekly. If 7PM-7AM, please contact night-coverage at [email or web address], password [email or web address] for any overnight needs.  07/26/2017, 1:52 PM

## 2017-07-26 NOTE — Progress Notes (Signed)
[facility] Gastroenterology Progress Note  Briana Sweeney 46 y.o. 09-Jan-1971  CC:  Cirrhosis with ascites, jaundice   Subjective: Patient had episode of nonbloody vomiting today. She had no bowel movement for last several days but now complaining of loose stools. Continues to complain of abdominal distention. She is afebrile.  ROS : Negative for chest pain . Negative for difficulty breathing. Positive for cough and weakness.   Objective: Vital signs in last 24 hours: Vitals:   07/26/17 0405 07/26/17 0806  BP: 93/68 93/66  Pulse: 90 88  Resp:  18  Temp: 98.6 F (37 C) (!) 97.2 F (36.2 C)  SpO2: 93% 100%    Physical Exam:  General:  Alert and oriented 3     Eyes:  Scleral icterus noted   Lungs:   Clear to auscultation bilaterally but somewhat decreased breath sounds   Heart:  Regular rate and rhythm, S1, S2 normal  Abdomen:    distended abdomen with generalized tenderness on palpation, no peritoneal signs bowel sounds active all four quadrants,  no masses,   Extremities: Extremities normal, atraumatic, 1 +  edema       Lab Results: Recent Labs    07/25/17 0301 07/26/17 0430  NA 136 136  K 3.4* 3.8  CL 109 108  CO2 21* 21*  GLUCOSE 96 93  BUN <5* <5*  CREATININE 0.66 0.64  CALCIUM 8.4* 8.5*   Recent Labs    07/24/17 1257 07/25/17 0301  AST 52* 43*  ALT 14 13*  ALKPHOS 109 100  BILITOT 8.5* 7.9*  PROT 7.2 7.0  ALBUMIN 2.9* 2.5*   Recent Labs    07/24/17 0553 07/25/17 0301  WBC 3.2* 3.4*  HGB 10.0* 10.0*  HCT 29.6* 29.6*  MCV 97.7 96.4  PLT PLATELET CLUMPS NOTED ON SMEAR, UNABLE TO ESTIMATE PLATELET CLUMPING, SUGGEST RECOLLECTION OF SAMPLE IN CITRATE TUBE.   Recent Labs    07/24/17 0553  LABPROT 23.3*  INR 2.09      Assessment/Plan: - Decompensated cirrhosis with ascites. Status post paracentesis with fluid analysis is negative for SBP 07/20/2017 .  MELD score of 23 and  discriminant function score of 45.2 as of 07/25/2017  - Jaundice. Could be  from decompensated cirrhosis versus alcoholic hepatitis. - History of hepatitis C infection. Negative HCV PCR. Most likely she has cleared her hepatitis C. - Thrombocytopenia with platelet counts in 40s - History of seizure disorder - Alcohol withdrawal - Confusion with hallucinations : Resolved - Constipation  Recommendations -------------------------  - DC lactulose and MiraLAX. - CT abdomen pelvis with IV contrast 11/15  showed hepatic steatosis but no acute changes or no small bowel obstruction.  - Repeat CMP and INR tomorrow. -  Continue pentoxifylline for now. - Currently on low-dose diuretics with spironolactone 50 mg and Lasix 20 mg.   - Absolute alcohol abstinence recommended  -  negative hepatitis B surface antigen. Positive HCV antibody with negative HCV PCR   -  - Avoid narcotics if possible - Long-term prognosis poor based on elevated meld score. - Given her history of seizure disorder, I would avoid use of steroids. - GI will follow tomorrow. Hopefully discharge home soon.    Otis Brace MD, [place] 07/26/2017, 11:56 AM  Contact #  [phone]

## 2017-07-27 ENCOUNTER — Inpatient Hospital Stay (HOSPITAL_COMMUNITY): Payer: Self-pay

## 2017-07-27 DIAGNOSIS — F1010 Alcohol abuse, uncomplicated: Secondary | ICD-10-CM | POA: Diagnosis not present

## 2017-07-27 DIAGNOSIS — R140 Abdominal distension (gaseous): Secondary | ICD-10-CM | POA: Diagnosis not present

## 2017-07-27 DIAGNOSIS — K7290 Hepatic failure, unspecified without coma: Secondary | ICD-10-CM | POA: Diagnosis not present

## 2017-07-27 DIAGNOSIS — R1084 Generalized abdominal pain: Secondary | ICD-10-CM | POA: Diagnosis not present

## 2017-07-27 LAB — COMPREHENSIVE METABOLIC PANEL
ALT: 10 U/L — ABNORMAL LOW (ref 14–54)
AST: 38 U/L (ref 15–41)
Albumin: 2.5 g/dL — ABNORMAL LOW (ref 3.5–5.0)
Alkaline Phosphatase: 106 U/L (ref 38–126)
Anion gap: 5 (ref 5–15)
BUN: <5 mg/dL — ABNORMAL LOW (ref 6–20)
CO2: 19 mmol/L — ABNORMAL LOW (ref 22–32)
Calcium: 8.2 mg/dL — ABNORMAL LOW (ref 8.9–10.3)
Chloride: 111 mmol/L (ref 101–111)
Creatinine, Ser: 0.64 mg/dL (ref 0.44–1.00)
GFR calc Af Amer: >60 mL/min (ref >60)
GFR calc non Af Amer: >60 mL/min (ref >60)
Glucose, Bld: 83 mg/dL (ref 65–99)
Potassium: 3.9 mmol/L (ref 3.5–5.1)
Sodium: 135 mmol/L (ref 135–145)
Total Bilirubin: 6.5 mg/dL — ABNORMAL HIGH (ref 0.3–1.2)
Total Protein: 6.8 g/dL (ref 6.5–8.1)

## 2017-07-27 LAB — CBC
HCT: 33.2 % — ABNORMAL LOW (ref 36.0–46.0)
Hemoglobin: 11.4 g/dL — ABNORMAL LOW (ref 12.0–15.0)
MCH: 33.1 pg (ref 26.0–34.0)
MCHC: 34.3 g/dL (ref 30.0–36.0)
MCV: 96.5 fL (ref 78.0–100.0)
RBC: 3.44 MIL/uL — ABNORMAL LOW (ref 3.87–5.11)
RDW: 21.3 % — ABNORMAL HIGH (ref 11.5–15.5)
WBC: 3.1 10*3/uL — ABNORMAL LOW (ref 4.0–10.5)

## 2017-07-27 LAB — PROTIME-INR
INR: 1.87
Prothrombin Time: 21.3 seconds — ABNORMAL HIGH (ref 11.4–15.2)

## 2017-07-27 MED ORDER — CHLORDIAZEPOXIDE HCL 25 MG PO CAPS
25.0000 mg | ORAL_CAPSULE | Freq: Four times a day (QID) | ORAL | Status: DC | PRN
  Administered 2017-07-27 – 2017-07-28 (×3): 25 mg via ORAL
  Filled 2017-07-27 (×3): qty 1

## 2017-07-27 MED ORDER — LIDOCAINE HCL (PF) 1 % IJ SOLN
INTRAMUSCULAR | Status: AC
  Filled 2017-07-27: qty 10

## 2017-07-27 MED ORDER — TRAMADOL HCL 50 MG PO TABS
50.0000 mg | ORAL_TABLET | Freq: Four times a day (QID) | ORAL | Status: DC | PRN
  Administered 2017-07-27 – 2017-07-28 (×2): 50 mg via ORAL
  Filled 2017-07-27 (×2): qty 1

## 2017-07-27 MED ORDER — LORATADINE 10 MG PO TABS: 10.0000 mg | ORAL_TABLET | Freq: Every day | ORAL | Status: DC | PRN

## 2017-07-27 NOTE — Progress Notes (Signed)
Progress Note    Briana Sweeney  PZW:[id number] DOB: 1971/05/27  DOA: 07/19/2017 PCP: Patient, No Pcp Per    Brief Narrative:   Chief complaint: Follow-up ascites/liver disease  Medical records reviewed and are as summarized below:  Briana Sweeney is an 46 y.o. female with a PMH of alcohol abuse, untreated hepatitis C, cirrhosis of the liver and seizure disorder who was admitted 07/19/17 for evaluation of a 2 day history of increased abdominal swelling associated with shortness of breath. Upon initial evaluation, the patient was noted to be tachypneic and tachycardic. She was hyponatremic with a sodium of 128 and had elevated bilirubin of 10.1. She was coagulopathic with a PT/INR 1.63. Diagnostic paracentesis was performed due to concerns for SBP and she was placed on empiric ceftriaxone.  Assessment/Plan:   Principal Problem:   Decompensated hepatic cirrhosis (HCC) versus alcoholic hepatitis/jaundice/ascites No evidence of SBP status post paracentesis x 2. Started on Lasix and Aldactone with fluid restriction of 1.5 L. Continue Trental. GI following. Steroids not currently recommended given active hallucinations. Meld score 23 with discriminate function score 37.7. Counseled on the need for absolute abstinence from alcohol. Poor long-term prognosis given meld score. Reports increased abdominal swelling, status post third paracentesis yielding only 500 mL of ascitic fluid.  Active Problems:   Abdominal distention/constipation/rule out small bowel obstruction Plain films done 07/24/17 and showed dilated loops of bowel. Diet downgraded to clear liquids. CT negative for SBO, but did show hepatic steatosis. Abdomen less distended status post paracentesis of 500 mL of fluid. Stop MiraLAX and lactulose, having liquid stools. Tolerating soft diet.    Alcohol abuse/DTs Tremulous today. Resume Librium when necessary. Continue folic acid supplementation. No active DTs.    Hepatitis C  antibody-positive (HCC) Negative hepatitis B surface antigen. Positive HCV antibody. HCV PCR undetectable.    Hypokalemia Repleted.    Coagulopathy ([place])  No signs of bleeding, continue to monitor.    Hypomagnesemia Repleted.    Pancytopenia ([place]) Likely from the toxic effects of alcohol and the bone marrow. All cell lines depressed. Monitor CBC.    Bipolar Continue Topamax and BuSpar.  Family Communication/Anticipated D/C date and plan/Code Status   DVT prophylaxis: SCDs ordered. Code Status: Full Code.  Family Communication: Daughter updated at the bedside. Disposition Plan: Homeless, and currently symptomatic at high risk for rehospitalization.   Medical Consultants:    Gastroenterology   Anti-Infectives:    None, empiric Rocephin discontinued.  Subjective:   Tremulous today. Still feels bad with abdominal bloating, pain, nausea but no vomiting. Says she's having liquid stools.  Objective:    Vitals:   07/26/17 2250 07/27/17 0418 07/27/17 0749 07/27/17 0825  BP: 95/70 (!) 89/62 106/64   Pulse: 83 81 75   Resp:  16 18 (!) 24  Temp:  97.8 F (36.6 C) (!) 97.5 F (36.4 C)   TempSrc:  Oral Oral   SpO2: 96% 92% 100%   Weight:  63.2 kg (139 lb 4.8 oz)    Height:        Intake/Output Summary (Last 24 hours) at 07/27/2017 0851 Last data filed at 07/26/2017 1700 Gross per 24 hour  Intake 1200 ml  Output -  Net 1200 ml   Filed Weights   07/25/17 0400 07/26/17 0405 07/27/17 0418  Weight: 65.3 kg (144 lb) 65.3 kg (143 lb 15.4 oz) 63.2 kg (139 lb 4.8 oz)    Exam: General: No acute distress. Cardiovascular: Heart sounds show a regular rate, and rhythm.  No gallops or rubs. No murmurs. No JVD. Lungs: Decreased breath sounds. No rales, rhonchi or wheezes. Abdomen: Soft, less distended today, normal active bowel sounds. No masses. No hepatosplenomegaly. Skin: Warm and dry. No rashes or lesions. Extremities: No clubbing or cyanosis. 2+ edema. Pedal pulses  2+.  Data Reviewed:   I have personally reviewed following labs and imaging studies:  Labs: Labs show the following:   Basic Metabolic Panel: Recent Labs  Lab 07/22/17 0304  07/24/17 0443 07/24/17 1257 07/25/17 0301 07/26/17 0430 07/27/17 0430  NA 135   < > QUESTIONABLE RESULTS, RECOMMEND RECOLLECT TO VERIFY 137 136 136 135  K 3.4*   < > QUESTIONABLE RESULTS, RECOMMEND RECOLLECT TO VERIFY 3.1* 3.4* 3.8 3.9  CL 108   < > QUESTIONABLE RESULTS, RECOMMEND RECOLLECT TO VERIFY 110 109 108 111  CO2 22   < > QUESTIONABLE RESULTS, RECOMMEND RECOLLECT TO VERIFY 18* 21* 21* 19*  GLUCOSE 90   < > QUESTIONABLE RESULTS, RECOMMEND RECOLLECT TO VERIFY 110* 96 93 83  BUN <5*   < > QUESTIONABLE RESULTS, RECOMMEND RECOLLECT TO VERIFY <5* <5* <5* <5*  CREATININE 0.63   < > QUESTIONABLE RESULTS, RECOMMEND RECOLLECT TO VERIFY 0.78 0.66 0.64 0.64  CALCIUM 8.1*   < > QUESTIONABLE RESULTS, RECOMMEND RECOLLECT TO VERIFY 8.7* 8.4* 8.5* 8.2*  MG 1.7  --   --   --   --   --   --    < > = values in this interval not displayed.   GFR Estimated Creatinine Clearance: 73.7 mL/min (by C-G formula based on SCr of 0.64 mg/dL). Liver Function Tests: Recent Labs  Lab 07/22/17 0304 07/24/17 0443 07/24/17 1257 07/25/17 0301 07/27/17 0430  AST 63* QUESTIONABLE RESULTS, RECOMMEND RECOLLECT TO VERIFY 52* 43* 38  ALT 17 QUESTIONABLE RESULTS, RECOMMEND RECOLLECT TO VERIFY 14 13* 10*  ALKPHOS 153* QUESTIONABLE RESULTS, RECOMMEND RECOLLECT TO VERIFY 109 100 106  BILITOT 9.2* QUESTIONABLE RESULTS, RECOMMEND RECOLLECT TO VERIFY 8.5* 7.9* 6.5*  PROT 7.2 QUESTIONABLE RESULTS, RECOMMEND RECOLLECT TO VERIFY 7.2 7.0 6.8  ALBUMIN 2.2* QUESTIONABLE RESULTS, RECOMMEND RECOLLECT TO VERIFY 2.9* 2.5* 2.5*    No results for input(s): AMMONIA in the last 168 hours. Coagulation profile Recent Labs  Lab 07/21/17 0314 07/22/17 0304 07/23/17 0324 07/24/17 0553 07/27/17 0430  INR 1.82 1.88 2.11 2.09 1.87    CBC: Recent Labs   Lab 07/22/17 0304 07/23/17 0324 07/24/17 0553 07/25/17 0301 07/27/17 0430  WBC 3.7* 3.1* 3.2* 3.4* 3.1*  NEUTROABS 1.9 1.3*  --   --   --   HGB 10.8* 9.1* 10.0* 10.0* 11.4*  HCT 32.0* 26.6* 29.6* 29.6* 33.2*  MCV 97.9 96.0 97.7 96.4 96.5  PLT PLATELET CLUMPING, SUGGEST RECOLLECTION OF SAMPLE IN CITRATE TUBE. PLATELET CLUMPING, SUGGEST RECOLLECTION OF SAMPLE IN CITRATE TUBE. PLATELET CLUMPS NOTED ON SMEAR, UNABLE TO ESTIMATE PLATELET CLUMPING, SUGGEST RECOLLECTION OF SAMPLE IN CITRATE TUBE. PLATELET CLUMPING, SUGGEST RECOLLECTION OF SAMPLE IN CITRATE TUBE.   Sepsis Labs: Recent Labs  Lab 07/23/17 0324 07/24/17 0553 07/25/17 0301 07/27/17 0430  WBC 3.1* 3.2* 3.4* 3.1*    Microbiology Recent Results (from the past 240 hour(s))  Body fluid culture ( includes gram stain)     Status: None   Collection Time: 07/19/17  7:57 PM  Result Value Ref Range Status   Specimen Description FLUID PERITONEAL CAVITY  Final   Special Requests Normal  Final   Gram Stain   Final    WBC PRESENT, PREDOMINANTLY MONONUCLEAR NO ORGANISMS  SEEN CYTOSPIN SMEAR    Culture NO GROWTH 3 DAYS  Final   Report Status 07/23/2017 FINAL  Final    Procedures and diagnostic studies:  07/27/17: DG Abd 2 views: My independent review of the image shows: Normal bowel gas pattern. 07/27/17: US Paracentesis: 500 mL removed. 07/24/17: CT of the abdomen and pelvis with contrast: My independent review of the image shows: No small bowel obstruction visualized. Hepatitis/hepatic steatosis, ascites.  07/24/17:2 views of the abdomen: My independent review of the image shows: Dilated loops of bowel. No transition point visible.  07/23/17 Ir Paracentesis: Successful ultrasound-guided paracentesis yielding 1 liters of peritoneal fluid. 07/20/17: US Paracentesis: Successful ultrasound-guided paracentesis yielding 0.7 liters of peritoneal fluid.  07/20/17 US Abdomen complete: Cirrhotic appearance of the liver with moderate  amount of ascites. Portal vein is patent with normal direction of flow. Edematous gallbladder wall measuring up to 6.4 mm. This may be due to cholecystitis or secondary to presence of abdominal ascites. Gallbladder sludge. Mild splenomegaly. 07/19/17: PCXR: Patchy atelectasis or scar at the left base. No evidence of pneumonia or CHF. 07/27/17: IR paracentesis   Medications:   . busPIRone  15 mg Oral TID  . folic acid  1 mg Oral Daily  . furosemide  20 mg Oral Daily  . metoCLOPramide  5 mg Oral TID AC & HS  . multivitamin with minerals  1 tablet Oral Daily  . pantoprazole  40 mg Oral QAC breakfast  . pentoxifylline  400 mg Oral TID WC  . phenytoin  300 mg Oral QHS  . potassium chloride  40 mEq Oral Once  . spironolactone  50 mg Oral Daily  . thiamine  100 mg Oral Daily  . topiramate  25 mg Oral BID  . traZODone  100 mg Oral QHS   Continuous Infusions:   LOS: 8 days   Jacquelynn Cree  [facility] Pager [phone]. If unable to reach me by pager, please call my cell phone at [phone].  *Please refer to [email or web address], password [email or web address] to get updated schedule on who will round on this patient, as hospitalists switch teams weekly. If 7PM-7AM, please contact night-coverage at [email or web address], password [email or web address] for any overnight needs.  07/27/2017, 8:51 AM

## 2017-07-27 NOTE — Procedures (Signed)
Ultrasound-guided therapeutic paracentesis performed yielding 500 cc of golden yellow fluid. No immediate complications.

## 2017-07-27 NOTE — Progress Notes (Signed)
[facility] Gastroenterology Progress Note  Briana Sweeney 46 y.o. [birth year]  CC:  Cirrhosis with ascites, jaundice   Subjective:  Patient's nausea and vomiting is resolved now. Complaining of dry cough. Having small bowel movement with loose stools. Complaining of abdominal distention.  ROS : Negative for chest pain . Negative for difficulty breathing. Positive for cough and weakness.   Objective: Vital signs in last 24 hours: Vitals:   07/27/17 0749 07/27/17 0825  BP: 106/64   Pulse: 75   Resp: 18 (!) 24  Temp: (!) 97.5 F (36.4 C)   SpO2: 100%     Physical Exam:  General:  Alert and oriented 3     Eyes:  Scleral icterus noted   Lungs:   Clear to auscultation bilaterally but somewhat decreased breath sounds   Heart:  Regular rate and rhythm, S1, S2 normal  Abdomen:    distended abdomen with generalized discomfort but no significant tenderness on palpation, no peritoneal signs bowel sounds active     Extremities: Extremities normal, atraumatic, 1 +  edema       Lab Results: Recent Labs    07/26/17 0430 07/27/17 0430  NA 136 135  K 3.8 3.9  CL 108 111  CO2 21* 19*  GLUCOSE 93 83  BUN <5* <5*  CREATININE 0.64 0.64  CALCIUM 8.5* 8.2*   Recent Labs    07/25/17 0301 07/27/17 0430  AST 43* 38  ALT 13* 10*  ALKPHOS 100 106  BILITOT 7.9* 6.5*  PROT 7.0 6.8  ALBUMIN 2.5* 2.5*   Recent Labs    07/25/17 0301 07/27/17 0430  WBC 3.4* 3.1*  HGB 10.0* 11.4*  HCT 29.6* 33.2*  MCV 96.4 96.5  PLT PLATELET CLUMPING, SUGGEST RECOLLECTION OF SAMPLE IN CITRATE TUBE. PLATELET CLUMPING, SUGGEST RECOLLECTION OF SAMPLE IN CITRATE TUBE.   Recent Labs    07/27/17 0430  LABPROT 21.3*  INR 1.87      Assessment/Plan: - Decompensated cirrhosis with ascites. Status post paracentesis with fluid analysis is negative for SBP 07/20/2017 .  MELD score of 23 and  discriminant function score of 45.2 as of 07/25/2017  - Jaundice. Could be from decompensated cirrhosis versus  alcoholic hepatitis. - History of hepatitis C infection. Negative HCV PCR. Most likely she has cleared her hepatitis C. - Thrombocytopenia with platelet counts in 40s - History of seizure disorder - Alcohol withdrawal - Confusion with hallucinations : Resolved - Constipation - abdominal distention.D/D  constipation versus ileus.  Recommendations ------------------------- - patient's LFTs are improving. Continue pentoxifylline for now. Her discriminant function score has improved to 34.6 today   - recommend avoiding frequent paracentesis. Patient already had 2 paracentesis during this admission with removal of only 0.7 and 1 L of fluids during each paracentesis/ Negative for SBP  - may consider increasing diuretics to spironolactone  100 mg and Lasix 40 mg.   - CT abdomen pelvis with IV contrast 11/15  showed hepatic steatosis but no acute changes or no small bowel obstruction.   - Repeat CMP and INR periodically  - Absolute alcohol abstinence recommended - Avoid narcotics if possible - Long-term prognosis poor based on elevated meld score. - Given her history of seizure disorder, I would avoid use of steroids. - GI will follow tomorrow. Hopefully discharge in next 1-2 days    Otis Brace MD, FACP 07/27/2017, 10:00 AM  Contact #  [phone]

## 2017-07-28 DIAGNOSIS — D61818 Other pancytopenia: Secondary | ICD-10-CM

## 2017-07-28 DIAGNOSIS — F1010 Alcohol abuse, uncomplicated: Secondary | ICD-10-CM

## 2017-07-28 DIAGNOSIS — D689 Coagulation defect, unspecified: Secondary | ICD-10-CM

## 2017-07-28 DIAGNOSIS — K769 Liver disease, unspecified: Secondary | ICD-10-CM

## 2017-07-28 DIAGNOSIS — K7290 Hepatic failure, unspecified without coma: Secondary | ICD-10-CM

## 2017-07-28 DIAGNOSIS — R188 Other ascites: Secondary | ICD-10-CM

## 2017-07-28 DIAGNOSIS — K7031 Alcoholic cirrhosis of liver with ascites: Secondary | ICD-10-CM

## 2017-07-28 MED ORDER — CHLORDIAZEPOXIDE HCL 25 MG PO CAPS: ORAL_CAPSULE | ORAL | 0 refills | Status: DC

## 2017-07-28 MED ORDER — PENTOXIFYLLINE ER 400 MG PO TBCR: 400.0000 mg | EXTENDED_RELEASE_TABLET | Freq: Three times a day (TID) | ORAL | 2 refills | Status: DC

## 2017-07-28 MED ORDER — FOLIC ACID 1 MG PO TABS: 1.0000 mg | ORAL_TABLET | Freq: Every day | ORAL | 2 refills | Status: DC

## 2017-07-28 MED ORDER — FUROSEMIDE 20 MG PO TABS: 20.0000 mg | ORAL_TABLET | Freq: Every day | ORAL | 2 refills | Status: DC

## 2017-07-28 MED ORDER — THIAMINE HCL 100 MG PO TABS
100.0000 mg | ORAL_TABLET | Freq: Every day | ORAL | 2 refills | Status: DC
Start: 2017-07-29 — End: 2017-09-04

## 2017-07-28 MED ORDER — THIAMINE HCL 100 MG PO TABS: 100.0000 mg | ORAL_TABLET | Freq: Every day | ORAL | 2 refills | Status: DC

## 2017-07-28 MED ORDER — SPIRONOLACTONE 50 MG PO TABS: 50.0000 mg | ORAL_TABLET | Freq: Every day | ORAL | 2 refills | Status: DC

## 2017-07-28 MED ORDER — PANTOPRAZOLE SODIUM 40 MG PO TBEC: 40.0000 mg | DELAYED_RELEASE_TABLET | Freq: Every day | ORAL | 2 refills | Status: DC

## 2017-07-28 NOTE — Care Management Note (Signed)
Case Management Note Marvetta Gibbons RN, BSN Unit 4E-Case Manager [phone]  Patient Details  Name: Varina Hulon MRN: [id number] Date of Birth: 1971-07-07  Subjective/Objective:  Admitted with  SBP (spontaneous bacterial peritonitis)- on CIWA protocol having hallucinations.                 Action/Plan: PTA pt lived at home- hx ETOH, hx seizures-- CM and CSW to follow for d/c needs  Expected Discharge Date:  07/28/17               Expected Discharge Plan:  Home/Self Care  In-House Referral:  Clinical Social Work  Discharge planning Services  CM Consult, Medication Assistance, [facility], Follow-up appt scheduled, MATCH Program  Post Acute Care Choice:  NA Choice offered to:  NA  DME Arranged:    DME Agency:     HH Arranged:    [employer] Agency:     Status of Service:  Completed, signed off  If discussed at [employer] of Stay Meetings, dates discussed:    Discharge Disposition: home/self care   Additional Comments:  07/28/17- 1500- Jammal Sarr RN, CM- pt for d/c home today- reviewed d/c medications- pt has 7 scripts for d/c- CM will assist with MATCH for discharge- pt provided [place] letter at bedside- program explained- $3 copay per script ($21 total) with one time use- also provided pt list of pharmacies to use- info sheet on [facility] center provided for f/u appointment.   07/25/17-Goldean, Charlynn Court, RN,, 3:24 PM-- Pt recently moved here from [facility] where she utilized a community clinic.   Pt prefers to come to [facility] for PCP and states she has transportation.  Appointment made for hospital f/u with [facility].  Pt may use [facility] pharmacy for medication needs Mon-Friday.   Dawayne Patricia, RN 07/28/2017, 4:47 PM

## 2017-07-28 NOTE — Progress Notes (Signed)
Subjective: The patient was seen and examined at bedside. Able to tolerate a diet, has mild nausea but denies vomiting. Had a bowel movement today and denies abdominal pain, feels little better after paracentesis but complains of continued abdominal distention.  Objective: Vital signs in last 24 hours: Temp:  [97.6 F (36.4 C)-98.2 F (36.8 C)] 98.1 F (36.7 C) (11/19 1248) Pulse Rate:  [68] 68 (11/19 1248) Resp:  [16-22] 19 (11/19 1248) BP: (90-112)/(58-85) 90/58 (11/19 1248) SpO2:  [100 %] 100 % (11/19 1248) Weight:  [62.2 kg (137 lb 3.2 oz)] 62.2 kg (137 lb 3.2 oz) (11/19 0507) Weight change: -0.953 kg (-1.6 oz) Last BM Date: 07/27/17  PE: Not in acute distress GENERAL: Icteric sclera, mild pallor ABDOMEN: Distended abdomen, non tender, normoactive bowel sounds, absence of shifting dullness of fluid thrill, no guarding/rigidity/rebound tenderness EXTREMITIES: No pitting edema bilaterally  Lab Results: Results for orders placed or performed during the hospital encounter of 07/19/17 (from the past 48 hour(s))  Comprehensive metabolic panel     Status: Abnormal   Collection Time: 07/27/17  4:30 AM  Result Value Ref Range   Sodium 135 135 - 145 mmol/L   Potassium 3.9 3.5 - 5.1 mmol/L   Chloride 111 101 - 111 mmol/L   CO2 19 (L) 22 - 32 mmol/L   Glucose, Bld 83 65 - 99 mg/dL   BUN <5 (L) 6 - 20 mg/dL   Creatinine, Ser 0.64 0.44 - 1.00 mg/dL   Calcium 8.2 (L) 8.9 - 10.3 mg/dL   Total Protein 6.8 6.5 - 8.1 g/dL   Albumin 2.5 (L) 3.5 - 5.0 g/dL   AST 38 15 - 41 U/L   ALT 10 (L) 14 - 54 U/L   Alkaline Phosphatase 106 38 - 126 U/L   Total Bilirubin 6.5 (H) 0.3 - 1.2 mg/dL   GFR calc non Af Amer >60 >60 mL/min   GFR calc Af Amer >60 >60 mL/min    Comment: (NOTE) The eGFR has been calculated using the CKD EPI equation. This calculation has not been validated in all clinical situations. eGFR's persistently <60 mL/min signify possible Chronic Kidney Disease.    Anion gap 5 5 -  15  Protime-INR     Status: Abnormal   Collection Time: 07/27/17  4:30 AM  Result Value Ref Range   Prothrombin Time 21.3 (H) 11.4 - 15.2 seconds   INR 1.87   CBC     Status: Abnormal   Collection Time: 07/27/17  4:30 AM  Result Value Ref Range   WBC 3.1 (L) 4.0 - 10.5 K/uL   RBC 3.44 (L) 3.87 - 5.11 MIL/uL   Hemoglobin 11.4 (L) 12.0 - 15.0 g/dL   HCT 33.2 (L) 36.0 - 46.0 %   MCV 96.5 78.0 - 100.0 fL   MCH 33.1 26.0 - 34.0 pg   MCHC 34.3 30.0 - 36.0 g/dL   RDW 21.3 (H) 11.5 - 15.5 %   Platelets  150 - 400 K/uL    PLATELET CLUMPING, SUGGEST RECOLLECTION OF SAMPLE IN CITRATE TUBE.    Studies/Results: US Paracentesis  Result Date: 07/27/2017 INDICATION: Cirrhosis, hepatitis-C, recurrent ascites. Request made for therapeutic paracentesis. EXAM: ULTRASOUND GUIDED THERAPEUTIC PARACENTESIS MEDICATIONS: None. COMPLICATIONS: None immediate. PROCEDURE: Informed written consent was obtained from the patient after a discussion of the risks, benefits and alternatives to treatment. A timeout was performed prior to the initiation of the procedure. Initial ultrasound scanning demonstrates a small amount of ascites within the right lower abdominal quadrant.  The right lower abdomen was prepped and draped in the usual sterile fashion. 1% lidocaine was used for local anesthesia. Following this, a Yueh catheter was introduced. An ultrasound image was saved for documentation purposes. The paracentesis was performed. The catheter was removed and a dressing was applied. The patient tolerated the procedure well without immediate post procedural complication. FINDINGS: A total of approximately 500 cc of golden yellow fluid was removed. IMPRESSION: Successful ultrasound-guided therapeutic paracentesis yielding 500 cc of peritoneal fluid. Read by: Rowe Robert, PA-C Electronically Signed   By: Lucrezia Europe M.D.   On: 07/27/2017 10:25   Dg Abd 2 Views  Result Date: 07/27/2017 CLINICAL DATA:  Abdominal distension  EXAM: ABDOMEN - 2 VIEW COMPARISON:  CT 07/24/2017 FINDINGS: Visualized lung bases clear.  Calcified left hilar lymph nodes. No free air. Normal bowel gas pattern. Left pelvic phlebolith.  Calcified splenic granuloma. Regional bones unremarkable. IMPRESSION: 1. Normal bowel gas pattern.  No free air. 2. Changes of old granulomatous disease. Electronically Signed   By: Lucrezia Europe M.D.   On: 07/27/2017 10:42    Medications: I have reviewed the patient's current medications.  Assessment: 1. 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 Cirrhotic appearing liver with ascites status post paracentesis 3. Normocytic anemia  Labs not available from today.  Plan: 1. Continue Trental 400 mg 3 times a day for total of 4 weeks. 2. Continue Lasix and spironolactone as an outpatient. 3. Recommend complete alcohol abstinence. 4. Needs follow-up with GI as an outpatient for management of cirrhosis. 5.Will need an EGD as an outpatient for variceal screening and banding if required. 6. Continue folic acid, multivitamin, thiamine even as an outpatient.  Okay to discharge from GI standpoint.   Ronnette Juniper 07/28/2017, 2:17 PM   Pager [phone] If no answer or after 5 PM call [phone]

## 2017-07-28 NOTE — Discharge Summary (Signed)
Physician Discharge Summary  Briana Sweeney UXN:[id number] DOB: 31-Oct-1970 DOA: 07/19/2017  PCP: Patient, No Pcp Per  Admit date: 07/19/2017 Discharge date: 07/28/2017  Admitted From: Home Discharge disposition: Home   Recommendations for Outpatient Follow-Up:   1. F/U arranged at the [facility].  2. Needs referral to GI for non-urgent EGD to screen for varices.   Discharge Diagnosis:   Principal Problem:   Decompensated hepatic cirrhosis (HCC) Active Problems:   Alcohol abuse   Hepatitis C antibody-positive (HCC)   Hypokalemia   Jaundice   Coagulopathy (HCC)   Hypomagnesemia   Pancytopenia (HCC)   Alcoholic cirrhosis of liver with ascites ([place])   History of seizures   Bipolar   Discharge Condition: Improved.  Diet recommendation: Low sodium, heart healthy.  C  History of Present Illness:   Briana Sweeney is an 46 y.o. female with a PMH of alcohol abuse, untreated hepatitis C, cirrhosis of the liver and seizure disorder who was admitted 07/19/17 for evaluation of a 2 day history of increased abdominal swelling associated with shortness of breath. Upon initial evaluation, the patient was noted to be tachypneic and tachycardic. She was hyponatremic with a sodium of 128 and had elevated bilirubin of 10.1. She was coagulopathic with a PT/INR 1.63. Diagnostic paracentesis was performed due to concerns for SBP and she was placed on empiric ceftriaxone.  Hospital Course by Problem:    Principal Problem:   Decompensated hepatic cirrhosis (HCC) versus alcoholic hepatitis/jaundice/ascites No evidence of SBP status post paracentesis x 2. Started on Lasix and Aldactone with fluid restriction of 1.5 L. Steroids not recommended per GI. Meld score 23 with discriminate function score 37.7. Counseled on the need for absolute abstinence from alcohol. Poor long-term prognosis given meld score. Patient is status post paracentesis 3, with GI not recommending further  paracenteses. Will be discharged on Aldactone, Lasix and Trental. Need outpatient referral to GI for screening EGD to rule out varices.  Active Problems:   Abdominal distention/constipation/rule out small bowel obstruction Plain films done 07/24/17 and showed dilated loops of bowel. CT negative for SBO, but did show hepatic steatosis. Abdomen less distended status post paracentesis of 500 mL of fluid 07/27/17. Tolerating soft diet and bowels moving at discharge.    Alcohol abuse/DTs Will taper Librium over the next 6 days. Continue folic acid supplementation. No active DTs. Counseled by multiple people that absolute abstinence would be required to prevent progression of her disease and possible fatal consequences. Provided resources for outpatient alcohol rehabilitation, but has no payor source.    Hepatitis C antibody-positive (HCC) Negative hepatitis B surface antigen. Positive HCV antibody. HCV PCR undetectable.    Hypokalemia Repleted.    Coagulopathy ([place])  No signs of bleeding, continue to monitor.    Hypomagnesemia Repleted.    Pancytopenia ([place]) Likely from the toxic effects of alcohol and the bone marrow. All cell lines depressed.     Bipolar Continue Topamax and BuSpar.    History of seizures Continue Dilantin.   Medical Consultants:    Gastroenterology.  Discharge Exam:   Vitals:   07/28/17 0800 07/28/17 1248  BP: 112/85 (!) 90/58  Pulse:  68  Resp:  19  Temp:  98.1 F (36.7 C)  SpO2:  100%   Vitals:   07/28/17 0507 07/28/17 0759 07/28/17 0800 07/28/17 1248  BP: 97/67 112/85 112/85 (!) 90/58  Pulse:    68  Resp: 18 19  19   Temp: 97.8 F (36.6 C) 97.6 F (36.4  C)  98.1 F (36.7 C)  TempSrc: Oral Oral  Oral  SpO2:  100%  100%  Weight: 62.2 kg (137 lb 3.2 oz)     Height:        General exam: Appears calm and comfortable.  Respiratory system: Clear to auscultation. Respiratory effort normal. Cardiovascular system: S1 & S2 heard, RRR. No  JVD,  rubs, gallops or clicks. No murmurs. Gastrointestinal system: Abdomen is less distended, minimally tender. No organomegaly or masses felt. Normal bowel sounds heard. Central nervous system: Alert and oriented. No focal neurological deficits. Extremities: No clubbing,  or cyanosis. 2+ edema. Skin: No rashes, lesions or ulcers. Jaundiced. Psychiatry: Judgement and insight appear poor. Mood & affect depressed/anxious.    The results of significant diagnostics from this hospitalization (including imaging, microbiology, ancillary and laboratory) are listed below for reference.     Procedures and Diagnostic Studies:   US Abdomen Complete  Result Date: 07/20/2017 CLINICAL DATA:  Hepatitis [place] cirrhosis. EXAM: ABDOMEN ULTRASOUND COMPLETE COMPARISON:  None. FINDINGS: Gallbladder: The gallbladder wall is edematous and measures 6.4 mm. There is layering sludge within the gallbladder. The sonographic Murphy's sign was reported as negative. Common bile duct: Diameter: 3.4 mm. Liver: Coarse heterogeneous in echogenicity. Portal vein is patent on color Doppler imaging with normal direction of blood flow towards the liver. IVC: Not seen. Pancreas: Not seen. Spleen: Prominent in size with volume of 596 cubic cm and length of 12.6 cm. Right Kidney: Length: 10.9 cm. Echogenicity within normal limits. No mass or hydronephrosis visualized. Left Kidney: Length: 10.9 cm. Echogenicity within normal limits. No mass or hydronephrosis visualized. Abdominal aorta: No aneurysm visualized, limited evaluation due to overlying bowel gas. Other findings: Moderate amount of ascites. IMPRESSION: Cirrhotic appearance of the liver with moderate amount of ascites. Portal vein is patent with normal direction of flow. Edematous gallbladder wall measuring up to 6.4 mm. This may be due to cholecystitis or secondary to presence of abdominal ascites. Gallbladder sludge. Mild splenomegaly. Electronically Signed   By: Fidela Salisbury M.D.   On: 07/20/2017 09:33   US Paracentesis  Result Date: 07/20/2017 INDICATION: History of alcoholic cirrhosis and new findings of ascites. Request is made for therapeutic paracentesis. EXAM: ULTRASOUND GUIDED THERAPEUTIC PARACENTESIS MEDICATIONS: 1% lidocaine COMPLICATIONS: None immediate. PROCEDURE: Informed written consent was obtained from the patient after a discussion of the risks, benefits and alternatives to treatment. A timeout was performed prior to the initiation of the procedure. Initial ultrasound scanning demonstrates a small amount of ascites within the right lower abdominal quadrant. The right lower abdomen was prepped and draped in the usual sterile fashion. 1% lidocaine was used for local anesthesia. Following this, a 19 gauge, 7-cm, Yueh catheter was introduced. An ultrasound image was saved for documentation purposes. The paracentesis was performed. The catheter was removed and a dressing was applied. The patient tolerated the procedure well without immediate post procedural complication. FINDINGS: A total of approximately 1 L of serous fluid was removed. IMPRESSION: Successful ultrasound-guided paracentesis yielding 1 liters of peritoneal fluid. Read by: Saverio Danker, PA-C Electronically Signed   By: Sandi Mariscal M.D.   On: 07/20/2017 12:46   Dg Chest Port 1 View  Result Date: 07/19/2017 CLINICAL DATA:  Shortness of breath EXAM: PORTABLE CHEST 1 VIEW COMPARISON:  None. FINDINGS: Calcified left hilar lymph nodes. Elevated right diaphragm. Patchy atelectasis or scar at the left lung base. Normal heart size. No pneumothorax. Surgical changes at the right glenoid IMPRESSION: 1. Patchy atelectasis or scar at  the left base. 2. Negative for edema or focal infiltrate. Electronically Signed   By: Donavan Foil M.D.   On: 07/19/2017 20:13     Labs:   Basic Metabolic Panel: Recent Labs  Lab 07/22/17 0304  07/24/17 0443 07/24/17 1257 07/25/17 0301 07/26/17 0430  07/27/17 0430  NA 135   < > QUESTIONABLE RESULTS, RECOMMEND RECOLLECT TO VERIFY 137 136 136 135  K 3.4*   < > QUESTIONABLE RESULTS, RECOMMEND RECOLLECT TO VERIFY 3.1* 3.4* 3.8 3.9  CL 108   < > QUESTIONABLE RESULTS, RECOMMEND RECOLLECT TO VERIFY 110 109 108 111  CO2 22   < > QUESTIONABLE RESULTS, RECOMMEND RECOLLECT TO VERIFY 18* 21* 21* 19*  GLUCOSE 90   < > QUESTIONABLE RESULTS, RECOMMEND RECOLLECT TO VERIFY 110* 96 93 83  BUN <5*   < > QUESTIONABLE RESULTS, RECOMMEND RECOLLECT TO VERIFY <5* <5* <5* <5*  CREATININE 0.63   < > QUESTIONABLE RESULTS, RECOMMEND RECOLLECT TO VERIFY 0.78 0.66 0.64 0.64  CALCIUM 8.1*   < > QUESTIONABLE RESULTS, RECOMMEND RECOLLECT TO VERIFY 8.7* 8.4* 8.5* 8.2*  MG 1.7  --   --   --   --   --   --    < > = values in this interval not displayed.   GFR Estimated Creatinine Clearance: 73.2 mL/min (by C-G formula based on SCr of 0.64 mg/dL). Liver Function Tests: Recent Labs  Lab 07/22/17 0304 07/24/17 0443 07/24/17 1257 07/25/17 0301 07/27/17 0430  AST 63* QUESTIONABLE RESULTS, RECOMMEND RECOLLECT TO VERIFY 52* 43* 38  ALT 17 QUESTIONABLE RESULTS, RECOMMEND RECOLLECT TO VERIFY 14 13* 10*  ALKPHOS 153* QUESTIONABLE RESULTS, RECOMMEND RECOLLECT TO VERIFY 109 100 106  BILITOT 9.2* QUESTIONABLE RESULTS, RECOMMEND RECOLLECT TO VERIFY 8.5* 7.9* 6.5*  PROT 7.2 QUESTIONABLE RESULTS, RECOMMEND RECOLLECT TO VERIFY 7.2 7.0 6.8  ALBUMIN 2.2* QUESTIONABLE RESULTS, RECOMMEND RECOLLECT TO VERIFY 2.9* 2.5* 2.5*   No results for input(s): LIPASE, AMYLASE in the last 168 hours. No results for input(s): AMMONIA in the last 168 hours. Coagulation profile Recent Labs  Lab 07/22/17 0304 07/23/17 0324 07/24/17 0553 07/27/17 0430  INR 1.88 2.11 2.09 1.87    CBC: Recent Labs  Lab 07/22/17 0304 07/23/17 0324 07/24/17 0553 07/25/17 0301 07/27/17 0430  WBC 3.7* 3.1* 3.2* 3.4* 3.1*  NEUTROABS 1.9 1.3*  --   --   --   HGB 10.8* 9.1* 10.0* 10.0* 11.4*  HCT 32.0* 26.6*  29.6* 29.6* 33.2*  MCV 97.9 96.0 97.7 96.4 96.5  PLT PLATELET CLUMPING, SUGGEST RECOLLECTION OF SAMPLE IN CITRATE TUBE. PLATELET CLUMPING, SUGGEST RECOLLECTION OF SAMPLE IN CITRATE TUBE. PLATELET CLUMPS NOTED ON SMEAR, UNABLE TO ESTIMATE PLATELET CLUMPING, SUGGEST RECOLLECTION OF SAMPLE IN CITRATE TUBE. PLATELET CLUMPING, SUGGEST RECOLLECTION OF SAMPLE IN CITRATE TUBE.   Microbiology Recent Results (from the past 240 hour(s))  Body fluid culture ( includes gram stain)     Status: None   Collection Time: 07/19/17  7:57 PM  Result Value Ref Range Status   Specimen Description FLUID PERITONEAL CAVITY  Final   Special Requests Normal  Final   Gram Stain   Final    WBC PRESENT, PREDOMINANTLY MONONUCLEAR NO ORGANISMS SEEN CYTOSPIN SMEAR    Culture NO GROWTH 3 DAYS  Final   Report Status 07/23/2017 FINAL  Final     Discharge Instructions:   Discharge Instructions    Call MD for:  extreme fatigue   Complete by:  As directed    Call MD for:  persistant  dizziness or light-headedness   Complete by:  As directed    Call MD for:  persistant nausea and vomiting   Complete by:  As directed    Call MD for:  severe uncontrolled pain   Complete by:  As directed    Diet - low sodium heart healthy   Complete by:  As directed    Increase activity slowly   Complete by:  As directed      Allergies as of 07/28/2017   No Known Allergies     Medication List    STOP taking these medications   OVER THE COUNTER MEDICATION     TAKE these medications   busPIRone 15 MG tablet Commonly known as:  BUSPAR Take 15 mg 3 (three) times daily by mouth.   chlordiazePOXIDE 25 MG capsule Commonly known as:  LIBRIUM Take 1 cap 2 x a day for 3 days then once a day for 3 days then stop.   folic acid 1 MG tablet Commonly known as:  FOLVITE Take 1 tablet (1 mg total) daily by mouth. Start taking on:  07/29/2017   furosemide 20 MG tablet Commonly known as:  LASIX Take 1 tablet (20 mg total) daily by  mouth. Start taking on:  07/29/2017   ibuprofen 200 MG tablet Commonly known as:  ADVIL,MOTRIN Take 200 mg every 6 (six) hours as needed by mouth (FOR PAIN).   pantoprazole 40 MG tablet Commonly known as:  PROTONIX Take 1 tablet (40 mg total) daily before breakfast by mouth. Start taking on:  07/29/2017   pentoxifylline 400 MG CR tablet Commonly known as:  TRENTAL Take 1 tablet (400 mg total) 3 (three) times daily with meals by mouth.   phenytoin 100 MG ER capsule Commonly known as:  DILANTIN Take 300 mg at bedtime by mouth.   spironolactone 50 MG tablet Commonly known as:  ALDACTONE Take 1 tablet (50 mg total) daily by mouth. Start taking on:  07/29/2017   thiamine 100 MG tablet Take 1 tablet (100 mg total) daily by mouth. Start taking on:  07/29/2017   topiramate 25 MG tablet Commonly known as:  TOPAMAX Take 25 mg 2 (two) times daily by mouth.      Follow-up [facility]. Go on 08/11/2017.   Specialty:  Internal Medicine Why:  Appointment for hospital follow-up at 9:00 on 08/11/17. Contact information: [facility] up.   Why:  Use pharmacy at [facility] to get prescriptions filled for $4-$10.   Contact information: [address] [place] [address] [phone]           Time coordinating discharge: 35 minutes.  SignedMargreta Journey Syrus Nakama  Pager [phone] [facility] 07/28/2017, 3:45 PM

## 2017-07-28 NOTE — Progress Notes (Signed)
Pt discharging home.  Taxi arranged with voucher to home address in [place].  All instructions and prescriptions given and reviewed, follow up appts in place.  All questions answered.

## 2017-07-28 NOTE — Progress Notes (Signed)
CSW met with patient at bedside; no family present. CSW and patient discussed living situation and substance use. Patient reported alcohol use. Patient reported she has lived with "party friends" since moving here from [place] and reports she will stay with a different friend when she is discharged from the hospital. [place] offered shelter resources and patient declined.   CSW offered outpatient and inpatient substance use resources. Patient accepted and is interested in inpatient. Pt indicated she would not be able to get transportation to outpatient appointments, stating where she will be staying is too far from bus lines and she does not have someone who can drive her. CSW offered to make referral to [facility] in [place] and patient declined, stating it is too far from friends and family in [place]. CSW encouraged patient to call other resources on list and discuss fee for service, as patient does not have insurance. CSW signing off.  Estanislado Emms, [place]

## 2017-08-06 MED FILL — SPIRONOLACTONE 50 MG TABLET: 50 | 30 days supply | Qty: 30 | Fill #0

## 2017-08-06 MED FILL — ?FUROSEMIDE 20 MG TABLET: 20 | 30 days supply | Qty: 30 | Fill #0

## 2017-08-06 MED FILL — ?PANTOPRAZOLE SOD DR 40MG: 40 MG | 30 days supply | Qty: 30 | Fill #0

## 2017-08-11 ENCOUNTER — Ambulatory Visit: Payer: Self-pay | Admitting: Family Medicine

## 2017-08-31 ENCOUNTER — Encounter (HOSPITAL_COMMUNITY): Payer: Self-pay | Admitting: Emergency Medicine

## 2017-08-31 ENCOUNTER — Inpatient Hospital Stay (HOSPITAL_COMMUNITY)
Admission: EM | Admit: 2017-08-31 | Discharge: 2017-09-04 | DRG: 378 | Disposition: A | Discharge status: Home / Self Care | Payer: Medicaid Other | Attending: Internal Medicine | Admitting: Internal Medicine

## 2017-08-31 DIAGNOSIS — K7010 Alcoholic hepatitis without ascites: Secondary | ICD-10-CM | POA: Diagnosis present

## 2017-08-31 DIAGNOSIS — Z23 Encounter for immunization: Secondary | ICD-10-CM

## 2017-08-31 DIAGNOSIS — K852 Alcohol induced acute pancreatitis without necrosis or infection: Secondary | ICD-10-CM

## 2017-08-31 DIAGNOSIS — Y908 Blood alcohol level of 240 mg/100 ml or more: Secondary | ICD-10-CM | POA: Diagnosis present

## 2017-08-31 DIAGNOSIS — R296 Repeated falls: Secondary | ICD-10-CM | POA: Diagnosis present

## 2017-08-31 DIAGNOSIS — G40909 Epilepsy, unspecified, not intractable, without status epilepticus: Secondary | ICD-10-CM | POA: Diagnosis present

## 2017-08-31 DIAGNOSIS — D61818 Other pancytopenia: Secondary | ICD-10-CM | POA: Diagnosis present

## 2017-08-31 DIAGNOSIS — B1920 Unspecified viral hepatitis C without hepatic coma: Secondary | ICD-10-CM | POA: Diagnosis present

## 2017-08-31 DIAGNOSIS — R000 Tachycardia, unspecified: Secondary | ICD-10-CM

## 2017-08-31 DIAGNOSIS — K7031 Alcoholic cirrhosis of liver with ascites: Secondary | ICD-10-CM | POA: Diagnosis present

## 2017-08-31 DIAGNOSIS — I8500 Esophageal varices without bleeding: Secondary | ICD-10-CM

## 2017-08-31 DIAGNOSIS — F319 Bipolar disorder, unspecified: Secondary | ICD-10-CM | POA: Diagnosis present

## 2017-08-31 DIAGNOSIS — Z7141 Alcohol abuse counseling and surveillance of alcoholic: Secondary | ICD-10-CM

## 2017-08-31 DIAGNOSIS — Z9119 Patient's noncompliance with other medical treatment and regimen: Secondary | ICD-10-CM

## 2017-08-31 DIAGNOSIS — K920 Hematemesis: Principal | ICD-10-CM

## 2017-08-31 DIAGNOSIS — F10231 Alcohol dependence with withdrawal delirium: Secondary | ICD-10-CM | POA: Diagnosis present

## 2017-08-31 DIAGNOSIS — K766 Portal hypertension: Secondary | ICD-10-CM | POA: Diagnosis present

## 2017-08-31 DIAGNOSIS — Z79899 Other long term (current) drug therapy: Secondary | ICD-10-CM

## 2017-08-31 DIAGNOSIS — Z9851 Tubal ligation status: Secondary | ICD-10-CM

## 2017-08-31 DIAGNOSIS — Z590 Homelessness: Secondary | ICD-10-CM

## 2017-08-31 DIAGNOSIS — K7460 Unspecified cirrhosis of liver: Secondary | ICD-10-CM

## 2017-08-31 DIAGNOSIS — Z800 Family history of malignant neoplasm of digestive organs: Secondary | ICD-10-CM

## 2017-08-31 DIAGNOSIS — F419 Anxiety disorder, unspecified: Secondary | ICD-10-CM | POA: Diagnosis present

## 2017-08-31 HISTORY — DX: Schizophrenia, unspecified: F20.9

## 2017-08-31 HISTORY — DX: Unspecified viral hepatitis C without hepatic coma: B19.20

## 2017-08-31 HISTORY — DX: Personal history of other medical treatment: Z92.89

## 2017-08-31 HISTORY — DX: Depression, unspecified: F32.A

## 2017-08-31 HISTORY — DX: Gastro-esophageal reflux disease without esophagitis: K21.9

## 2017-08-31 HISTORY — DX: Migraine, unspecified, not intractable, without status migrainosus: G43.909

## 2017-08-31 HISTORY — DX: Bipolar disorder, unspecified: F31.9

## 2017-08-31 HISTORY — DX: Major depressive disorder, single episode, unspecified: F32.9

## 2017-08-31 HISTORY — DX: Unspecified cirrhosis of liver: K74.60

## 2017-08-31 LAB — COMPREHENSIVE METABOLIC PANEL
ALT: 19 U/L (ref 14–54)
AST: 78 U/L — ABNORMAL HIGH (ref 15–41)
Albumin: 3.1 g/dL — ABNORMAL LOW (ref 3.5–5.0)
Alkaline Phosphatase: 160 U/L — ABNORMAL HIGH (ref 38–126)
Anion gap: 17 — ABNORMAL HIGH (ref 5–15)
BUN: 7 mg/dL (ref 6–20)
CO2: 19 mmol/L — ABNORMAL LOW (ref 22–32)
Calcium: 8.7 mg/dL — ABNORMAL LOW (ref 8.9–10.3)
Chloride: 106 mmol/L (ref 101–111)
Creatinine, Ser: 0.59 mg/dL (ref 0.44–1.00)
GFR calc Af Amer: >60 mL/min (ref >60)
GFR calc non Af Amer: >60 mL/min (ref >60)
Glucose, Bld: 88 mg/dL (ref 65–99)
Potassium: 3.7 mmol/L (ref 3.5–5.1)
Sodium: 142 mmol/L (ref 135–145)
Total Bilirubin: 2.8 mg/dL — ABNORMAL HIGH (ref 0.3–1.2)
Total Protein: 9.2 g/dL — ABNORMAL HIGH (ref 6.5–8.1)

## 2017-08-31 LAB — CBG MONITORING, ED: Glucose-Capillary: 81 mg/dL (ref 65–99)

## 2017-08-31 LAB — CBC
HCT: 37.6 % (ref 36.0–46.0)
Hemoglobin: 12.5 g/dL (ref 12.0–15.0)
MCH: 32.3 pg (ref 26.0–34.0)
MCHC: 33.2 g/dL (ref 30.0–36.0)
MCV: 97.2 fL (ref 78.0–100.0)
RBC: 3.87 MIL/uL (ref 3.87–5.11)
RDW: 17.2 % — ABNORMAL HIGH (ref 11.5–15.5)
WBC: 3.2 10*3/uL — ABNORMAL LOW (ref 4.0–10.5)

## 2017-08-31 LAB — LIPASE, BLOOD: Lipase: 72 U/L — ABNORMAL HIGH (ref 11–51)

## 2017-08-31 LAB — PROTIME-INR
INR: 1.29
Prothrombin Time: 15.9 s — ABNORMAL HIGH (ref 11.4–15.2)

## 2017-08-31 LAB — ETHANOL: Alcohol, Ethyl (B): 325 mg/dL (ref <10)

## 2017-08-31 LAB — I-STAT BETA HCG BLOOD, ED (MC, WL, AP ONLY): I-stat hCG, quantitative: <5 m[IU]/mL (ref <5)

## 2017-08-31 LAB — APTT: aPTT: 35 seconds (ref 24–36)

## 2017-08-31 MED ORDER — SODIUM CHLORIDE 0.9 % IV BOLUS (SEPSIS)
1000.0000 mL | Freq: Once | INTRAVENOUS | Status: AC
  Administered 2017-08-31: 1000 mL via INTRAVENOUS

## 2017-08-31 MED ORDER — KETOROLAC TROMETHAMINE 30 MG/ML IJ SOLN
30.0000 mg | Freq: Once | INTRAMUSCULAR | Status: AC
  Administered 2017-08-31: 30 mg via INTRAVENOUS
  Filled 2017-08-31: qty 1

## 2017-08-31 MED ORDER — PANTOPRAZOLE SODIUM 40 MG IV SOLR
40.0000 mg | Freq: Once | INTRAVENOUS | Status: AC
  Administered 2017-08-31: 40 mg via INTRAVENOUS
  Filled 2017-08-31: qty 40

## 2017-08-31 NOTE — ED Provider Notes (Signed)
[facility] EMERGENCY DEPARTMENT Provider Note   CSN: [id number] Arrival date & time: 08/31/17  9629     History   Chief Complaint Chief Complaint  Patient presents with  . Seizures  . Emesis  . Headache    HPI Briana Sweeney is a 46 y.o. female.  HPI  46 y.o. female with a hx of Hepatitis C, ETOH abuse, Cirrhosis of the liver, SBP, Seizures, presents to the Emergency Department today via EMS due to seizures as well as abdominal pain. Pt states that multiple times today (x4 total) she was having "silent" seizures, which she states she would start shaking for a few seconds and then this would dissipates. Notes some blurred vision as well, but this goes away as well. Pt recollects each time these seizures happen. Ntoes taking Topamax and Phenytoin for these, but states she ran out of medication since her discharge from the hospital on the 19th of November. Per chart review, recent discharge on 07-28-17 due to ascitis with suspect SBP. This was negative. Pt started on Lasix and Aldactone and Trental with fluid restriction. Pt compliant with medications. Pt notes RUQ/Epigastric abdominal pain as well as intermittent episodes of hematemesis since discharge from hospital. Notes throwing up blood x2 hours PTA. Pt also endorses ETOH intake since discharge from hospital, knowing that this will kill her if she continues, pt states she just can't stop drinking. Notes intake this morning. Unsure of amount. No fevers. No cough/congestion. No other symptoms noted.   Past Medical History:  Diagnosis Date  . Anxiety   . Headache   . Hepatitis    hepatitis c  . History of kidney stones   . SBP (spontaneous bacterial peritonitis) ([place]) 07/2017  . Seizures [facility])     Patient Active Problem List   Diagnosis Date Noted  . Ascites due to alcoholic cirrhosis ([place])   . Decompensated hepatic cirrhosis ([place]) 07/23/2017  . Alcohol abuse 07/23/2017  . Hepatitis C, chronic ([place]) 07/23/2017   . Hypokalemia 07/23/2017  . Jaundice 07/23/2017  . Coagulopathy ([place]) 07/23/2017  . Hypomagnesemia 07/23/2017  . Pancytopenia ([place]) 07/23/2017  . Alcoholic cirrhosis of liver with ascites ([place]) 07/23/2017    Past Surgical History:  Procedure Laterality Date  . IR PARACENTESIS  07/23/2017  . TUBAL LIGATION      OB History    No data available       Home Medications    Prior to Admission medications   Medication Sig Start Date End Date Taking? Authorizing Provider  busPIRone (BUSPAR) 15 MG tablet Take 15 mg 3 (three) times daily by mouth.    [provider]  chlordiazePOXIDE (LIBRIUM) 25 MG capsule Take 1 cap 2 x a day for 3 days then once a day for 3 days then stop. 07/28/17   Rama, Venetia Maxon, MD  folic acid (FOLVITE) 1 MG tablet Take 1 tablet (1 mg total) daily by mouth. 07/29/17   Rama, Venetia Maxon, MD  furosemide (LASIX) 20 MG tablet Take 1 tablet (20 mg total) daily by mouth. 07/29/17   Rama, Venetia Maxon, MD  ibuprofen (ADVIL,MOTRIN) 200 MG tablet Take 200 mg every 6 (six) hours as needed by mouth (FOR PAIN).    [provider]  pantoprazole (PROTONIX) 40 MG tablet Take 1 tablet (40 mg total) daily before breakfast by mouth. 07/29/17   Rama, Venetia Maxon, MD  pentoxifylline (TRENTAL) 400 MG CR tablet Take 1 tablet (400 mg total) 3 (three) times daily with meals by  mouth. 07/28/17   Rama, Venetia Maxon, MD  phenytoin (DILANTIN) 100 MG ER capsule Take 300 mg at bedtime by mouth.    [provider]  spironolactone (ALDACTONE) 50 MG tablet Take 1 tablet (50 mg total) daily by mouth. 07/29/17   Rama, Venetia Maxon, MD  thiamine 100 MG tablet Take 1 tablet (100 mg total) daily by mouth. 07/29/17   Rama, Venetia Maxon, MD  topiramate (TOPAMAX) 25 MG tablet Take 25 mg 2 (two) times daily by mouth.    [provider]    Family History No family history on file.  Social History Social History   Tobacco Use  . Smoking status: Never Smoker  .  Smokeless tobacco: Never Used  Substance Use Topics  . Alcohol use: Yes    Comment: Vodka  1/2  pint per day  . Drug use: No     Allergies   Patient has no known allergies.   Review of Systems Review of Systems ROS reviewed and all are negative for acute change except as noted in the HPI.  Physical Exam Updated Vital Signs BP 110/88   Pulse (!) 127   Resp (!) 22   Ht 5' (1.524 m)   Wt 60.3 kg (133 lb)   SpO2 100%   BMI 25.97 kg/m   Physical Exam  Constitutional: She is oriented to person, place, and time. She appears well-developed and well-nourished. No distress.  HENT:  Head: Normocephalic and atraumatic.  Right Ear: Tympanic membrane, external ear and ear canal normal.  Left Ear: Tympanic membrane, external ear and ear canal normal.  Nose: Nose normal.  Mouth/Throat: Uvula is midline, oropharynx is clear and moist and mucous membranes are normal. No trismus in the jaw. No oropharyngeal exudate, posterior oropharyngeal erythema or tonsillar abscesses.  Eyes: EOM are normal. Pupils are equal, round, and reactive to light.  Neck: Normal range of motion. Neck supple. No tracheal deviation present.  Cardiovascular: Regular rhythm, S1 normal, S2 normal, normal heart sounds, intact distal pulses and normal pulses. Tachycardia present.  Pulmonary/Chest: Effort normal and breath sounds normal. No respiratory distress. She has no decreased breath sounds. She has no wheezes. She has no rhonchi. She has no rales.  Abdominal: Normal appearance and bowel sounds are normal. There is no tenderness.  Abdomen soft. No Ascites present   Musculoskeletal: Normal range of motion.  Neurological: She is alert and oriented to person, place, and time.  Skin: Skin is warm and dry.  Psychiatric: She has a normal mood and affect. Her speech is normal and behavior is normal. Thought content normal.  Nursing note and vitals reviewed.  ED Treatments / Results  Labs (all labs ordered are listed,  but only abnormal results are displayed) Labs Reviewed  CBC - Abnormal; Notable for the following components:      Result Value   WBC 3.2 (*)    RDW 17.2 (*)    All other components within normal limits  COMPREHENSIVE METABOLIC PANEL - Abnormal; Notable for the following components:   CO2 19 (*)    Calcium 8.7 (*)    Total Protein 9.2 (*)    Albumin 3.1 (*)    AST 78 (*)    Alkaline Phosphatase 160 (*)    Total Bilirubin 2.8 (*)    Anion gap 17 (*)    All other components within normal limits  PROTIME-INR - Abnormal; Notable for the following components:   Prothrombin Time 15.9 (*)    All other components  within normal limits  ETHANOL - Abnormal; Notable for the following components:   Alcohol, Ethyl (B) 325 (*)    All other components within normal limits  LIPASE, BLOOD - Abnormal; Notable for the following components:   Lipase 72 (*)    All other components within normal limits  APTT  CBG MONITORING, ED  I-STAT BETA HCG BLOOD, ED ([facility], [facility], [facility] ONLY)    EKG  EKG Interpretation None       Radiology No results found.  Procedures Procedures (including critical care time)  Medications Ordered in ED Medications  sodium chloride 0.9 % bolus 1,000 mL (0 mLs Intravenous Stopped 08/31/17 2231)  pantoprazole (PROTONIX) injection 40 mg (40 mg Intravenous Given 08/31/17 2022)  sodium chloride 0.9 % bolus 1,000 mL (1,000 mLs Intravenous New Bag/Given 08/31/17 2221)  ketorolac (TORADOL) 30 MG/ML injection 30 mg (30 mg Intravenous Given 08/31/17 2220)   Initial Impression / Assessment and Plan / ED Course  I have reviewed the triage vital signs and the nursing notes.  Pertinent labs & imaging results that were available during my care of the patient were reviewed by me and considered in my medical decision making (see chart for details).  Final Clinical Impressions(s) / ED Diagnoses  {I have reviewed and evaluated the relevant laboratory values.   {I have reviewed the  relevant previous healthcare records.  {I obtained HPI from historian. {Patient discussed with supervising physician.  ED Course:  Assessment: Pt is a 46 y.o. female with a hx of Hepatitis C, ETOH abuse, Cirrhosis of the liver, SBP, Seizures, presents to the Emergency Department today via EMS due to seizures as well as abdominal pain. Pt states that multiple times today (x4 total) she was having "silent" seizures, which she states she would start shaking for a few seconds and then this would dissipates. Notes some blurred vision as well, but this goes away as well. Pt recollects each time these seizures happen. Ntoes taking Topamax and Phenytoin for these, but states she ran out of medication since her discharge from the hospital on the 19th of November. Per chart review, recent discharge on 07-28-17 due to ascitis with suspect SBP. This was negative. Pt started on Lasix and Aldactone and Trental with fluid restriction. Pt compliant with medications. Pt notes RUQ/Epigastric abdominal pain as well as intermittent episodes of hematemesis since discharge from hospital. Notes throwing up blood x2 hours PTA. Pt also endorses ETOH intake since discharge from hospital, knowing that this will kill her if she continues, pt states she just can't stop drinking. Notes intake this morning. Unsure of amount. No fevers. No cough/congestion.  On exam, pt in NAD. Nontoxic/nonseptic appearing. VS with tachycardia 130s. Normotensive. Likely 2/2 hypovolemia from emesis. Afebrile. Lungs CTA. Heart RRR. Abdomen nontender soft. No evidence of ascites. CBC unremarkable. CMP appears baseline with some improvement from previous. Lipase with elevation suggestive of alcoholic pancreatitis. ETOH 325. Given 2L NS Bolus in ED. Discussed with attending physician. Pt could benefit with admission due to persistent tachycardia requiring slow fluid hydration due to restriction with ascites. Pt notes abdominal pain with persistant nausea likely  2/2 pancreatitis. After discussion with patient, she requests help for detox even though she resisted during admission. States that she knows that the alcohol will kill her if she keeps going. Plan is to admit  Disposition/Plan:  Admit Pt acknowledges and agrees with plan  Supervising Physician Carmin Muskrat, MD  Final diagnoses:  Alcohol-induced acute pancreatitis, unspecified complication status  Cirrhosis of  liver without ascites, unspecified hepatic cirrhosis type [facility])  Tachycardia    ED Discharge Orders    None       Shary Decamp, PA-C 08/31/17 2314    Carmin Muskrat, MD 08/31/17 2326
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
## 2017-09-01 ENCOUNTER — Other Ambulatory Visit: Payer: Self-pay

## 2017-09-01 ENCOUNTER — Encounter (HOSPITAL_COMMUNITY): Payer: Self-pay | Admitting: Physician Assistant

## 2017-09-01 DIAGNOSIS — K3189 Other diseases of stomach and duodenum: Secondary | ICD-10-CM

## 2017-09-01 DIAGNOSIS — F1010 Alcohol abuse, uncomplicated: Secondary | ICD-10-CM | POA: Diagnosis not present

## 2017-09-01 DIAGNOSIS — D61818 Other pancytopenia: Secondary | ICD-10-CM | POA: Diagnosis not present

## 2017-09-01 DIAGNOSIS — K7031 Alcoholic cirrhosis of liver with ascites: Secondary | ICD-10-CM

## 2017-09-01 DIAGNOSIS — K920 Hematemesis: Secondary | ICD-10-CM

## 2017-09-01 DIAGNOSIS — K922 Gastrointestinal hemorrhage, unspecified: Secondary | ICD-10-CM

## 2017-09-01 DIAGNOSIS — R945 Abnormal results of liver function studies: Secondary | ICD-10-CM

## 2017-09-01 LAB — COMPREHENSIVE METABOLIC PANEL
ALT: 17 U/L (ref 14–54)
AST: 64 U/L — ABNORMAL HIGH (ref 15–41)
Albumin: 2.5 g/dL — ABNORMAL LOW (ref 3.5–5.0)
Alkaline Phosphatase: 113 U/L (ref 38–126)
Anion gap: 14 (ref 5–15)
BUN: 5 mg/dL — ABNORMAL LOW (ref 6–20)
CO2: 18 mmol/L — ABNORMAL LOW (ref 22–32)
Calcium: 8.1 mg/dL — ABNORMAL LOW (ref 8.9–10.3)
Chloride: 106 mmol/L (ref 101–111)
Creatinine, Ser: 0.58 mg/dL (ref 0.44–1.00)
GFR calc Af Amer: >60 mL/min (ref >60)
GFR calc non Af Amer: >60 mL/min (ref >60)
Glucose, Bld: 46 mg/dL — ABNORMAL LOW (ref 65–99)
Potassium: 3.5 mmol/L (ref 3.5–5.1)
Sodium: 138 mmol/L (ref 135–145)
Total Bilirubin: 2.3 mg/dL — ABNORMAL HIGH (ref 0.3–1.2)
Total Protein: 7.8 g/dL (ref 6.5–8.1)

## 2017-09-01 LAB — CBC
HCT: 31.5 % — ABNORMAL LOW (ref 36.0–46.0)
HCT: 32.9 % — ABNORMAL LOW (ref 36.0–46.0)
Hemoglobin: 10.3 g/dL — ABNORMAL LOW (ref 12.0–15.0)
Hemoglobin: 10.7 g/dL — ABNORMAL LOW (ref 12.0–15.0)
MCH: 31.7 pg (ref 26.0–34.0)
MCH: 31.2 pg (ref 26.0–34.0)
MCHC: 32.7 g/dL (ref 30.0–36.0)
MCHC: 32.5 g/dL (ref 30.0–36.0)
MCV: 96.9 fL (ref 78.0–100.0)
MCV: 95.9 fL (ref 78.0–100.0)
Platelets: 21 10*3/uL — CL (ref 150–400)
RBC: 3.25 MIL/uL — ABNORMAL LOW (ref 3.87–5.11)
RBC: 3.43 MIL/uL — ABNORMAL LOW (ref 3.87–5.11)
RDW: 17.2 % — ABNORMAL HIGH (ref 11.5–15.5)
RDW: 16.5 % — ABNORMAL HIGH (ref 11.5–15.5)
WBC: 2 10*3/uL — ABNORMAL LOW (ref 4.0–10.5)
WBC: 2.3 10*3/uL — ABNORMAL LOW (ref 4.0–10.5)

## 2017-09-01 LAB — PROTIME-INR
INR: 1.45
Prothrombin Time: 17.6 seconds — ABNORMAL HIGH (ref 11.4–15.2)

## 2017-09-01 LAB — ABO/RH: ABO/RH(D): O POS

## 2017-09-01 LAB — TYPE AND SCREEN
ABO/RH(D): O POS
Antibody Screen: NEGATIVE

## 2017-09-01 MED ORDER — ORAL CARE MOUTH RINSE
15.0000 mL | Freq: Two times a day (BID) | OROMUCOSAL | Status: DC
Start: 2017-09-01 — End: 2017-09-04
  Administered 2017-09-01 – 2017-09-04 (×4): 15 mL via OROMUCOSAL

## 2017-09-01 MED ORDER — LORAZEPAM 2 MG/ML IJ SOLN
0.0000 mg | Freq: Four times a day (QID) | INTRAMUSCULAR | Status: AC
  Administered 2017-09-01 – 2017-09-03 (×7): 2 mg via INTRAVENOUS
  Filled 2017-09-01 (×2): qty 1
  Filled 2017-09-01: qty 2
  Filled 2017-09-01 (×4): qty 1

## 2017-09-01 MED ORDER — LORAZEPAM 2 MG/ML IJ SOLN
1.0000 mg | Freq: Four times a day (QID) | INTRAMUSCULAR | Status: DC | PRN
  Administered 2017-09-01 – 2017-09-03 (×3): 1 mg via INTRAVENOUS
  Filled 2017-09-01 (×5): qty 1

## 2017-09-01 MED ORDER — SODIUM CHLORIDE 0.9% FLUSH
3.0000 mL | Freq: Two times a day (BID) | INTRAVENOUS | Status: DC
  Administered 2017-09-01 – 2017-09-04 (×4): 3 mL via INTRAVENOUS

## 2017-09-01 MED ORDER — PHENYTOIN SODIUM EXTENDED 100 MG PO CAPS
300.0000 mg | ORAL_CAPSULE | Freq: Every day | ORAL | Status: DC
  Administered 2017-09-01 – 2017-09-03 (×4): 300 mg via ORAL
  Filled 2017-09-01 (×5): qty 3

## 2017-09-01 MED ORDER — VITAMIN B-1 100 MG PO TABS
100.0000 mg | ORAL_TABLET | Freq: Every day | ORAL | Status: DC
Start: 2017-09-01 — End: 2017-09-01

## 2017-09-01 MED ORDER — TOPIRAMATE 25 MG PO TABS
25.0000 mg | ORAL_TABLET | Freq: Two times a day (BID) | ORAL | Status: DC
  Administered 2017-09-01 – 2017-09-04 (×7): 25 mg via ORAL
  Filled 2017-09-01 (×7): qty 1

## 2017-09-01 MED ORDER — THIAMINE HCL 100 MG/ML IJ SOLN: 100.0000 mg | Freq: Every day | INTRAMUSCULAR | Status: DC

## 2017-09-01 MED ORDER — PANTOPRAZOLE SODIUM 40 MG IV SOLR
40.0000 mg | Freq: Two times a day (BID) | INTRAVENOUS | Status: DC
  Administered 2017-09-01: 40 mg via INTRAVENOUS
  Filled 2017-09-01: qty 40

## 2017-09-01 MED ORDER — OCTREOTIDE ACETATE 500 MCG/ML IJ SOLN
50.0000 ug/h | INTRAMUSCULAR | Status: DC
  Administered 2017-09-01 (×2): 50 ug/h via INTRAVENOUS
  Filled 2017-09-01 (×3): qty 1

## 2017-09-01 MED ORDER — FOLIC ACID 1 MG PO TABS: 1.0000 mg | ORAL_TABLET | Freq: Every day | ORAL | Status: DC

## 2017-09-01 MED ORDER — FOLIC ACID 1 MG PO TABS
1.0000 mg | ORAL_TABLET | Freq: Every day | ORAL | Status: DC
  Administered 2017-09-01 – 2017-09-04 (×4): 1 mg via ORAL
  Filled 2017-09-01 (×4): qty 1

## 2017-09-01 MED ORDER — LORAZEPAM 1 MG PO TABS: 1.0000 mg | ORAL_TABLET | Freq: Four times a day (QID) | ORAL | Status: DC | PRN

## 2017-09-01 MED ORDER — BUSPIRONE HCL 15 MG PO TABS
15.0000 mg | ORAL_TABLET | Freq: Three times a day (TID) | ORAL | Status: DC
  Administered 2017-09-01 – 2017-09-04 (×10): 15 mg via ORAL
  Filled 2017-09-01 (×10): qty 1

## 2017-09-01 MED ORDER — ADULT MULTIVITAMIN W/MINERALS CH
1.0000 | ORAL_TABLET | Freq: Every day | ORAL | Status: DC
  Administered 2017-09-01 – 2017-09-04 (×4): 1 via ORAL
  Filled 2017-09-01 (×4): qty 1

## 2017-09-01 MED ORDER — LORAZEPAM 1 MG PO TABS
1.0000 mg | ORAL_TABLET | Freq: Four times a day (QID) | ORAL | Status: DC | PRN
  Administered 2017-09-01 – 2017-09-03 (×5): 1 mg via ORAL
  Filled 2017-09-01 (×5): qty 1

## 2017-09-01 MED ORDER — INFLUENZA VAC SPLIT QUAD 0.5 ML IM SUSY
0.5000 mL | PREFILLED_SYRINGE | INTRAMUSCULAR | Status: AC
  Administered 2017-09-04: 0.5 mL via INTRAMUSCULAR
  Filled 2017-09-01: qty 0.5

## 2017-09-01 MED ORDER — VITAMIN B-1 100 MG PO TABS
100.0000 mg | ORAL_TABLET | Freq: Every day | ORAL | Status: DC
  Administered 2017-09-01 – 2017-09-04 (×4): 100 mg via ORAL
  Filled 2017-09-01 (×4): qty 1

## 2017-09-01 MED ORDER — PENTOXIFYLLINE ER 400 MG PO TBCR
400.0000 mg | EXTENDED_RELEASE_TABLET | Freq: Three times a day (TID) | ORAL | Status: DC
  Administered 2017-09-01 – 2017-09-04 (×8): 400 mg via ORAL
  Filled 2017-09-01 (×12): qty 1

## 2017-09-01 MED ORDER — PANTOPRAZOLE SODIUM 40 MG PO TBEC
40.0000 mg | DELAYED_RELEASE_TABLET | Freq: Two times a day (BID) | ORAL | Status: DC
Start: 2017-09-01 — End: 2017-09-04
  Administered 2017-09-01 – 2017-09-04 (×6): 40 mg via ORAL
  Filled 2017-09-01 (×6): qty 1

## 2017-09-01 MED ORDER — DEXTROSE 5 % IV SOLN
2.0000 g | Freq: Every day | INTRAVENOUS | Status: DC
  Administered 2017-09-01: 2 g via INTRAVENOUS
  Filled 2017-09-01 (×2): qty 2

## 2017-09-01 MED ORDER — LORAZEPAM 2 MG/ML IJ SOLN
1.0000 mg | Freq: Four times a day (QID) | INTRAMUSCULAR | Status: DC | PRN
  Administered 2017-09-01 (×2): 1 mg via INTRAVENOUS
  Filled 2017-09-01 (×2): qty 1

## 2017-09-01 MED ORDER — SODIUM CHLORIDE 0.9 % IV SOLN
INTRAVENOUS | Status: DC
  Administered 2017-09-01 – 2017-09-03 (×4): via INTRAVENOUS

## 2017-09-01 MED ORDER — PNEUMOCOCCAL VAC POLYVALENT 25 MCG/0.5ML IJ INJ
0.5000 mL | INJECTION | INTRAMUSCULAR | Status: AC
  Administered 2017-09-04: 0.5 mL via INTRAMUSCULAR
  Filled 2017-09-01: qty 0.5

## 2017-09-01 MED ORDER — OCTREOTIDE ACETATE 50 MCG/ML IJ SOLN
50.0000 ug | Freq: Once | INTRAMUSCULAR | Status: AC
  Administered 2017-09-01: 50 ug via SUBCUTANEOUS
  Filled 2017-09-01: qty 1

## 2017-09-01 MED ORDER — LORAZEPAM 2 MG/ML IJ SOLN
0.0000 mg | Freq: Two times a day (BID) | INTRAMUSCULAR | Status: DC
  Administered 2017-09-04: 2 mg via INTRAVENOUS
  Filled 2017-09-01: qty 1
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
## 2017-09-01 NOTE — Consult Note (Signed)
[facility] Consult: 10:52 AM 09/01/2017  LOS: 0 days    Referring Provider: Dr Curly Rim.    Primary Care Physician: None.  Patient has upcoming appointment to establish care at the [facility] clinic. Primary Gastroenterologist: Althia Forts.  Seen by [facility] 11/12 as an unassigned patient.    Reason for Consultation:  hematemesis   HPI: Briana Sweeney is a 46 y.o. female.  She is an alcoholic with hepatitis C.  Cirrhosis.  Ascites.  Normocytic anemia.  Bipolar disorder.  Seizure disorder.  Pancytopenia.    Was admitted to the hospital 07/19/17 - 07/28/17 complaining of abdominal swelling and dyspnea.  She was hyponatremic, sodium 128.  GI evaluation regarding ascites and cirrhosis.  Underwent diagnostic paracentesis11/11, only 25 cc were removed.  Fluid WBCs 148, mesothelial cells seen on fluid cytology.  Repeat therapeutic paracentesis x 2 totaling 700 cc and 500 cc.  She received Librium taper for acute alcohol withdrawal with DTs.  She discharged on Protonix 40 mg daily, Aldactone 50 mg daily, Furosemide 20 mg daily.   At arrival AST/ALT 103/22.  Total bilirubin 10.1.  Alkaline phosphatase 204.  Over the course of her hospitalization LFTs improved for the most part though total bilirubin continued to rise, 6.5 at discharge.  PT/INR reached as high as 23.4/2.11.  HCV antibody >11.  However HCV undetectable by PCR.  Pancytopenia.   Abdominal ultrasound 07/20/17 confirmed cirrhotic liver, moderate ascites, patent portal vein with normal direction of flow.  Edematous gallbladder wall along with gallbladder sludge.  Mild splenomegaly CT abdomen pelvis with contrast 07/24/17: Low-attenuation of liver likely representing combination of hepatitis and hepatic steatosis.  Small to moderate volume ascites.  Evidence of portal  hypertension with venous collateralization and mild splenomegaly.  Atelectasis versus aspiration pneumonitis.  Patient no showed for 08/11/17 appointment with [facility] clinic to establish care.  This has been rescheduled for 09/11/16 Patient never filled med prescriptions.  Continues to consume alcohol in large quantities, admits to 1.5 L daily of vodka.  About a day prior to arrival she had syncopal spells on top of frequent falls.  She is semi-homeless, has no permanent residence, currently to staying with a friend in [place].    Patient presented to the ED with complaints of blood in her emesis.  This started yesterday morning.  She has previous episodes of nausea and vomiting and has seen blood in her emesis before, usually after lots of drinking.  However because she had 3 or 4 episodes at home yesterday, she decided that she needed to seek medical attention.  Last episode of the hematemesis was around 4 PM yesterday.  No melena.  No previous EGD or colonoscopy.  No aspirin or NSAIDs. Hgb 12.5... 10.3 following IV fluids.  Baseline ~ 10 from 6 weeks ago.  T bili 2.8 .. 2.3.  Alkaline phosphatase 160... 113.  AST/ALT 78/19... 64/17.  Lipase 72.  PT/INR 17.6/1.4.   On all but 1 out of 12 CBCs obtained since early November, platelets have clumped.  On only one occasion was a platelet number of  48 reported.   Placed on BID IVProtonix and Octreotide drip,  Rocephin.  Receiving as needed Ativan for alcohol withdrawal.  She also says that she had a seizure yesterday morning but did not lose consciousness.  She is complaining of cramps in her legs.  She says she wants to go to alcohol rehab.  Current consumption is half a gallon of vodka per day.  She says she missed her appointment with the medical clinic because she has no transportation and yet associates will drive and purchase alcohol for the patient.  She is on disability owing to bipolar disorder, cirrhosis.  Says that she was started on "shots" for her  hepatitis when she was living in [place] a few months ago.  She was supposed to start oral agents, sounds like it may have been Harvoni, but did not follow through and eventually left [employer], returning to [place].   She has a daughter who lives in [place].  Sounds like the relationship is estranged.     Past Medical History:  Diagnosis Date  . Bipolar affective disorder ([place])    With anxiety features  . Cirrhosis ([place])    Due to alcohol and hepatitis C  . Headache   . Hepatitis C 2018   hepatitis c and alcohol related hepatitis  . History of kidney stones   . Seizures ([place])     Past Surgical History:  Procedure Laterality Date  . IR PARACENTESIS  07/23/2017  . TUBAL LIGATION      Prior to Admission medications   Medication Sig Start Date End Date Taking? Authorizing Provider  furosemide (LASIX) 20 MG tablet Take 1 tablet (20 mg total) daily by mouth. 07/29/17  Yes Rama, Venetia Maxon, MD  ibuprofen (ADVIL,MOTRIN) 200 MG tablet Take 200 mg every 6 (six) hours as needed by mouth (FOR PAIN).   Yes [provider]  spironolactone (ALDACTONE) 50 MG tablet Take 1 tablet (50 mg total) daily by mouth. 07/29/17  Yes Rama, Venetia Maxon, MD  busPIRone (BUSPAR) 15 MG tablet Take 15 mg 3 (three) times daily by mouth.    [provider]  chlordiazePOXIDE (LIBRIUM) 25 MG capsule Take 1 cap 2 x a day for 3 days then once a day for 3 days then stop. Patient not taking: Reported on 08/31/2017 07/28/17   Rama, Venetia Maxon, MD  folic acid (FOLVITE) 1 MG tablet Take 1 tablet (1 mg total) daily by mouth. Patient not taking: Reported on 08/31/2017 07/29/17   Rama, Venetia Maxon, MD  pantoprazole (PROTONIX) 40 MG tablet Take 1 tablet (40 mg total) daily before breakfast by mouth. Patient not taking: Reported on 08/31/2017 07/29/17   Rama, Venetia Maxon, MD  pentoxifylline (TRENTAL) 400 MG CR tablet Take 1 tablet (400 mg total) 3 (three) times daily with meals by mouth. Patient not  taking: Reported on 08/31/2017 07/28/17   Rama, Venetia Maxon, MD  phenytoin (DILANTIN) 100 MG ER capsule Take 300 mg at bedtime by mouth.    [provider]  thiamine 100 MG tablet Take 1 tablet (100 mg total) daily by mouth. Patient not taking: Reported on 08/31/2017 07/29/17   Rama, Venetia Maxon, MD  topiramate (TOPAMAX) 25 MG tablet Take 25 mg 2 (two) times daily by mouth.    [provider]    Scheduled Meds: . busPIRone  15 mg Oral TID  . folic acid  1 mg Oral Daily  . [START ON 09/02/2017] Influenza vac split quadrivalent PF  0.5 mL Intramuscular Tomorrow-1000  .  mouth rinse  15 mL Mouth Rinse BID  . multivitamin with minerals  1 tablet Oral Daily  . pantoprazole (PROTONIX) IV  40 mg Intravenous Q12H  . pentoxifylline  400 mg Oral TID WC  . phenytoin  300 mg Oral QHS  . [START ON 09/02/2017] pneumococcal 23 valent vaccine  0.5 mL Intramuscular Tomorrow-1000  . sodium chloride flush  3 mL Intravenous Q12H  . thiamine  100 mg Oral Daily   Or  . thiamine  100 mg Intravenous Daily  . topiramate  25 mg Oral BID   Infusions: . cefTRIAXone (ROCEPHIN)  IV 2 g (09/01/17 0311)  . octreotide  (SANDOSTATIN)    IV infusion 50 mcg/hr (09/01/17 0311)   PRN Meds: LORazepam **OR** LORazepam   Allergies as of 08/31/2017  . (No Known Allergies)    No family history on file.  Social History   Socioeconomic History  . Marital status: Legally Separated    Spouse name: Not on file  . Number of children: Not on file  . Years of education: Not on file  . Highest education level: Not on file  Social Needs  . Financial resource strain: Not on file  . Food insecurity - worry: Not on file  . Food insecurity - inability: Not on file  . Transportation needs - medical: Not on file  . Transportation needs - non-medical: Not on file  Occupational History  . Not on file  Tobacco Use  . Smoking status: Never Smoker  . Smokeless tobacco: Never Used  Substance and Sexual  Activity  . Alcohol use: Yes    Comment: Vodka  1/2  pint per day  . Drug use: No  . Sexual activity: Not on file  Other Topics Concern  . Not on file  Social History Narrative  . Not on file    REVIEW OF SYSTEMS: Constitutional: Mildly ill-appearing ENT:  No nose bleeds Pulm: No shortness of breath.  When she can she smokes up to 2 packs of cigarettes a day.  Nonproductive cough. CV:  No palpitations, no LE edema.  No chest pain GU:  No hematuria, no frequency GI:  Per HPI Heme: Says that she uses easily. Transfusions: None Neuro: Her seizures tend to be brief.  Are associated with myoclonus. Derm:  No itching, no rash or sores.  Endocrine:  No sweats or chills.  No polyuria or dysuria Immunization: Did not inquire about recent immunizations. Travel:  None beyond local counties in last few months.    PHYSICAL EXAM: Vital signs in last 24 hours: Vitals:   09/01/17 0500 09/01/17 0827  BP: 113/60   Pulse: (!) 116 (!) 105  Resp: 18   Temp: 97.7 F (36.5 C)   SpO2: 95%    Wt Readings from Last 3 Encounters:  09/01/17 58.3 kg (128 lb 8.5 oz)  07/28/17 62.2 kg (137 lb 3.2 oz)    General: Not acutely ill appearing.  Looks tired and somewhat withdrawn.  Does not look jaundiced, just looks excessively suntanned. Head: No facial asymmetry.  Slight bruising on the left eyelid Eyes: No scleral icterus.  No conjunctival pallor. Ears: Not hard of hearing Nose: Discharge or congestion Mouth: Tongue midline.  Oral mucosa pink, moist, clear. Neck: No JVD, no masses, no thyroid mass Lungs: Clear bilaterally.  Raspy voice. Heart: RRR.  No MRG.  S1, S2 present Abdomen: Soft.  Mildly tender in the epigastrium.  No guarding or rebound.  No masses, no HSM.  No bruits.  Hernias.   Rectal: Did not perform Musc/Skeltl: No joint redness, swelling or significant deformities Extremities: No CCE. Neurologic: Alert.  Oriented x3.  Slightly tremulous.  No asterixis.  No limb weakness. Skin:  Rather than jaundice or skin appears more excessively tanned. Nodes: No cervical adenopathy. Psych: Flat affect.  Calm.  Cooperative  Intake/Output from previous day: 12/23 0701 - 12/24 0700 In: 2120.4 [I.V.:70.4; IV Piggyback:2050] Out: -  Intake/Output this shift: Total I/O In: 60 [P.O.:60] Out: -   LAB RESULTS: Recent Labs    08/31/17 2001 09/01/17 0623  WBC 3.2* 2.0*  HGB 12.5 10.3*  HCT 37.6 31.5*  PLT PLATELET CLUMPING, SUGGEST RECOLLECTION OF SAMPLE IN CITRATE TUBE. PLATELET CLUMPING, SUGGEST RECOLLECTION OF SAMPLE IN CITRATE TUBE.   BMET Lab Results  Component Value Date   NA 138 09/01/2017   NA 142 08/31/2017   NA 135 07/27/2017   K 3.5 09/01/2017   K 3.7 08/31/2017   K 3.9 07/27/2017   CL 106 09/01/2017   CL 106 08/31/2017   CL 111 07/27/2017   CO2 18 (L) 09/01/2017   CO2 19 (L) 08/31/2017   CO2 19 (L) 07/27/2017   GLUCOSE 46 (L) 09/01/2017   GLUCOSE 88 08/31/2017   GLUCOSE 83 07/27/2017   BUN 5 (L) 09/01/2017   BUN 7 08/31/2017   BUN <5 (L) 07/27/2017   CREATININE 0.58 09/01/2017   CREATININE 0.59 08/31/2017   CREATININE 0.64 07/27/2017   CALCIUM 8.1 (L) 09/01/2017   CALCIUM 8.7 (L) 08/31/2017   CALCIUM 8.2 (L) 07/27/2017   LFT Recent Labs    08/31/17 2001 09/01/17 0623  PROT 9.2* 7.8  ALBUMIN 3.1* 2.5*  AST 78* 64*  ALT 19 17  ALKPHOS 160* 113  BILITOT 2.8* 2.3*   PT/INR Lab Results  Component Value Date   INR 1.45 09/01/2017   INR 1.29 08/31/2017   INR 1.87 07/27/2017   Hepatitis Panel No results for input(s): HEPBSAG, HCVAB, HEPAIGM, HEPBIGM in the last 72 hours. C-Diff No components found for: CDIFF Lipase     Component Value Date/Time   LIPASE 72 (H) 08/31/2017 2000    Drugs of Abuse  No results found for: LABOPIA, COCAINSCRNUR, LABBENZ, AMPHETMU, THCU, LABBARB   RADIOLOGY STUDIES: No results found.    IMPRESSION:   *  Hematemesis.  R/o MWT, varices, ulcers, etoh gastritis.    *  Cirrhosis, Hep C and ETOH.   Interestingly had undetectable HCV by PCR 6 weeks ago.    *Portal hypertensive  ascites.  No SBP per recent tap and fluid studies.     pancytopenia from cirrhosis with portal hypertension.  Platelets clumped on 11/12 CBCs in last 6 weeks.    *  Medical non-compliance and ongoing alcohol abuse.   PLAN:     *  EGD?.  NPO except meds for now, if no EGD could advance to clears.    *  Continue Protonix po.  Need to continue octreotide?  Azucena Freed  09/01/2017, 10:52 AM Pager: [phone]  I have reviewed the entire case in detail with the above APP and discussed the plan in detail.  Therefore, I agree with the diagnoses recorded above. In addition,  I have personally interviewed and examined the patient and have personally reviewed any abdominal/pelvic CT scan images.  My additional thoughts are as follows:  She appears to have had self-limited hematemesis in the setting of protracted vomiting from alcohol abuse.  There is no current evidence  of brisk GI bleeding. She also has decompensated alcoholic cirrhosis with a recent admission for volume overload and renal dysfunction.  Her hemoglobin appears to be about the baseline from that last admission.  She is never been screened for esophageal varices, but I do not think she is having a variceal bleed at this time.  My recommendations are to keep her on ice chips and water for now, I will see her tomorrow after serial hemoglobin and hematocrit checks and we will have a better idea if there is any ongoing upper GI bleeding.  At that point, I can determine the necessity and timing of upper endoscopy.  Watch her closely for alcohol withdrawal and treat as needed.  Discontinue octreotide Nelida Meuse III Pager [phone]  Mon-Fri 8a-5p [phone] after 5p, weekends, holidays

## 2017-09-01 NOTE — Progress Notes (Signed)
[facility] PROGRESS NOTE  Jaidee Stipe ZOX:[id number] DOB: 1971-08-14 DOA: 08/31/2017  PCP: Briana Sweeney, No Pcp Per  Brief History/Interval Summary: 46 year old Caucasian female with a past medical history of alcohol abuse, liver cirrhosis who was recently hospitalized in November for alcoholic hepatitis and ascites.  She underwent paracentesis during the hospitalization.  She was seen by gastroenterology.  She was discharged on diuretics.  She presented today with complains of seizure type activity at home along with hematemesis.  She was noted to be withdrawing from alcohol.  She was hospitalized for further management.  Reason for Visit: Hematemesis.  Alcohol withdrawal.  Possible seizures.  Consultants: Gastroenterology  Procedures: None yet  Antibiotics: Ceftriaxone  Subjective/Interval History: Briana Sweeney states that she is feeling tremulous.  She complains of some nausea.  Has not had any vomiting since last night.  She does admit to vomiting out blood.  Denies any chest pain shortness of breath.  No seizures since she has been in the hospital.  ROS: Denies any headaches  Objective:  Vital Signs  Vitals:   09/01/17 0200 09/01/17 0500 09/01/17 0827 09/01/17 1133  BP: 138/90 113/60  106/65  Pulse: (!) 117 (!) 116 (!) 105 (!) 120  Resp: 20 18    Temp: 98.2 F (36.8 C) 97.7 F (36.5 C)  98.3 F (36.8 C)  TempSrc: Oral     SpO2: 98% 95%    Weight: 58.3 kg (128 lb 8.5 oz)     Height: 5' (1.524 m)       Intake/Output Summary (Last 24 hours) at 09/01/2017 1227 Last data filed at 09/01/2017 1010 Gross per 24 hour  Intake 2180.42 ml  Output -  Net 2180.42 ml   Filed Weights   08/31/17 1912 09/01/17 0200  Weight: 60.3 kg (133 lb) 58.3 kg (128 lb 8.5 oz)    General appearance: alert, cooperative, appears stated age, no distress and Noted to be tremulous Head: Normocephalic, without obvious abnormality, atraumatic Resp: Diminished air entry at the bases.  No  wheezing rales or rhonchi. Cardio: S1-S2 is noted to be tachycardic regular.  No S3-S4.  No rubs murmurs or bruit GI: Abdomen is soft.  Mildly distended.  Bowel sounds present.  No masses.  Appears to be nontender. Extremities: extremities normal, atraumatic, no cyanosis or edema Pulses: 2+ and symmetric Neurologic: Grossly normal  Lab Results:  Data Reviewed: I have personally reviewed following labs and imaging studies  CBC: Recent Labs  Lab 08/31/17 2001 09/01/17 0623  WBC 3.2* 2.0*  HGB 12.5 10.3*  HCT 37.6 31.5*  MCV 97.2 96.9  PLT PLATELET CLUMPING, SUGGEST RECOLLECTION OF SAMPLE IN CITRATE TUBE. PLATELET CLUMPING, SUGGEST RECOLLECTION OF SAMPLE IN CITRATE TUBE.    Basic Metabolic Panel: Recent Labs  Lab 08/31/17 2001 09/01/17 0623  NA 142 138  K 3.7 3.5  CL 106 106  CO2 19* 18*  GLUCOSE 88 46*  BUN 7 5*  CREATININE 0.59 0.58  CALCIUM 8.7* 8.1*    GFR: Estimated Creatinine Clearance: 70.9 mL/min (by C-G formula based on SCr of 0.58 mg/dL).  Liver Function Tests: Recent Labs  Lab 08/31/17 2001 09/01/17 0623  AST 78* 64*  ALT 19 17  ALKPHOS 160* 113  BILITOT 2.8* 2.3*  PROT 9.2* 7.8  ALBUMIN 3.1* 2.5*    Recent Labs  Lab 08/31/17 2000  LIPASE 72*    Coagulation Profile: Recent Labs  Lab 08/31/17 2000 09/01/17 0623  INR 1.29 1.45    CBG: Recent Labs  Lab 08/31/17  [place] 81    Radiology Studies: No results found.   Medications:  Scheduled: . busPIRone  15 mg Oral TID  . folic acid  1 mg Oral Daily  . [START ON 09/02/2017] Influenza vac split quadrivalent PF  0.5 mL Intramuscular Tomorrow-1000  . LORazepam  0-4 mg Intravenous Q6H   Followed by  . [START ON 09/03/2017] LORazepam  0-4 mg Intravenous Q12H  . mouth rinse  15 mL Mouth Rinse BID  . multivitamin with minerals  1 tablet Oral Daily  . pantoprazole (PROTONIX) IV  40 mg Intravenous Q12H  . pentoxifylline  400 mg Oral TID WC  . phenytoin  300 mg Oral QHS  . [START  ON 09/02/2017] pneumococcal 23 valent vaccine  0.5 mL Intramuscular Tomorrow-1000  . sodium chloride flush  3 mL Intravenous Q12H  . thiamine  100 mg Oral Daily   Or  . thiamine  100 mg Intravenous Daily  . topiramate  25 mg Oral BID   Continuous: . cefTRIAXone (ROCEPHIN)  IV 2 g (09/01/17 0311)  . octreotide  (SANDOSTATIN)    IV infusion 50 mcg/hr (09/01/17 0311)   MWN:[id number] **OR** LORazepam  Assessment/Plan:  Active Problems:   Upper GI bleed    Hematemesis This is in the setting of liver cirrhosis.  Hemoglobin noted to be low at this morning compared to yesterday.  Briana Sweeney is on PPI twice daily.  She has been started on octreotide infusion as well.  Gastroenterology has been consulted.  Continue n.p.o. status.  Briana Sweeney started on ceftriaxone for prophylaxis.  Alcoholic liver disease with ascites Hospitalized in November for alcoholic hepatitis.  She underwent paracentesis.  SBP was ruled out.  She was discharged home on diuretics.  She mentions that she has been compliant with same.  She was also discharged on pentoxifylline.  This is being continued.  Diuretics are on hold.  Hyperbilirubinemia is noted but is better compared to what it was last month.  Alcohol abuse with alcohol withdrawal/possible withdrawal seizures No seizure activity noted in the hospital.  She is noted to be tachycardic.  Continue alcohol withdrawal CIWA protocol.  Thiamine, multivitamins, folic acid.  Watch closely and if she starts to get worse then she may have to be moved down to stepdown unit.  History of positive HCV antibody Management as outpatient.  History of seizure disorder She is noted to be on Dilantin and Topamax at home which is being continued.  See above as well.  History of anxiety disorder Continue BuSpar.  Possible acute blood loss anemia Hemoglobin noted to be lower today compared to yesterday.  But values are similar to what it was last month.  Drop could be dilutional or  could be due to hematemesis.  Continue to monitor.  Recheck later today.  Leukopenia and thrombocytopenia Most likely due to alcohol abuse and liver disease.  Continue to monitor.  DVT Prophylaxis: SCDs    Code Status: Full code Family Communication: Discussed with the Briana Sweeney Disposition Plan: Management as outlined above.    LOS: 0 days   [facility] Pager [phone] 09/01/2017, 12:27 PM  If 7PM-7AM, please contact night-coverage at [email or web address], password [facility]

## 2017-09-01 NOTE — ED Notes (Signed)
Attempted to call report

## 2017-09-01 NOTE — Progress Notes (Signed)
Platelet count of 21. MD notified . Will watch closely for any signs of bleeding.

## 2017-09-02 DIAGNOSIS — F1010 Alcohol abuse, uncomplicated: Secondary | ICD-10-CM

## 2017-09-02 DIAGNOSIS — K922 Gastrointestinal hemorrhage, unspecified: Secondary | ICD-10-CM | POA: Diagnosis not present

## 2017-09-02 DIAGNOSIS — G40909 Epilepsy, unspecified, not intractable, without status epilepticus: Secondary | ICD-10-CM | POA: Diagnosis not present

## 2017-09-02 DIAGNOSIS — R188 Other ascites: Secondary | ICD-10-CM | POA: Diagnosis not present

## 2017-09-02 DIAGNOSIS — K920 Hematemesis: Secondary | ICD-10-CM | POA: Diagnosis not present

## 2017-09-02 DIAGNOSIS — D72819 Decreased white blood cell count, unspecified: Secondary | ICD-10-CM

## 2017-09-02 DIAGNOSIS — D696 Thrombocytopenia, unspecified: Secondary | ICD-10-CM

## 2017-09-02 DIAGNOSIS — K7460 Unspecified cirrhosis of liver: Secondary | ICD-10-CM

## 2017-09-02 LAB — CBC
HCT: 31.6 % — ABNORMAL LOW (ref 36.0–46.0)
Hemoglobin: 10.3 g/dL — ABNORMAL LOW (ref 12.0–15.0)
MCH: 31.6 pg (ref 26.0–34.0)
MCHC: 32.6 g/dL (ref 30.0–36.0)
MCV: 96.9 fL (ref 78.0–100.0)
RBC: 3.26 MIL/uL — ABNORMAL LOW (ref 3.87–5.11)
RDW: 16.4 % — ABNORMAL HIGH (ref 11.5–15.5)
WBC: 2.1 10*3/uL — ABNORMAL LOW (ref 4.0–10.5)

## 2017-09-02 LAB — COMPREHENSIVE METABOLIC PANEL
ALT: 17 U/L (ref 14–54)
AST: 60 U/L — ABNORMAL HIGH (ref 15–41)
Albumin: 2.4 g/dL — ABNORMAL LOW (ref 3.5–5.0)
Alkaline Phosphatase: 105 U/L (ref 38–126)
Anion gap: 12 (ref 5–15)
BUN: 5 mg/dL — ABNORMAL LOW (ref 6–20)
CO2: 20 mmol/L — ABNORMAL LOW (ref 22–32)
Calcium: 8.1 mg/dL — ABNORMAL LOW (ref 8.9–10.3)
Chloride: 103 mmol/L (ref 101–111)
Creatinine, Ser: 0.76 mg/dL (ref 0.44–1.00)
GFR calc Af Amer: >60 mL/min (ref >60)
GFR calc non Af Amer: >60 mL/min (ref >60)
Glucose, Bld: 138 mg/dL — ABNORMAL HIGH (ref 65–99)
Potassium: 4.1 mmol/L (ref 3.5–5.1)
Sodium: 135 mmol/L (ref 135–145)
Total Bilirubin: 3.2 mg/dL — ABNORMAL HIGH (ref 0.3–1.2)
Total Protein: 7.7 g/dL (ref 6.5–8.1)

## 2017-09-02 LAB — MAGNESIUM: Magnesium: 1.7 mg/dL (ref 1.7–2.4)

## 2017-09-02 MED ORDER — LORAZEPAM 2 MG/ML IJ SOLN
1.0000 mg | Freq: Once | INTRAMUSCULAR | Status: DC
  Filled 2017-09-02: qty 1

## 2017-09-02 MED ORDER — MAGNESIUM SULFATE IN D5W 1-5 GM/100ML-% IV SOLN
1.0000 g | Freq: Once | INTRAVENOUS | Status: AC
  Administered 2017-09-02: 1 g via INTRAVENOUS
  Filled 2017-09-02: qty 100

## 2017-09-02 MED ORDER — SODIUM CHLORIDE 0.9% FLUSH
10.0000 mL | INTRAVENOUS | Status: DC | PRN
  Administered 2017-09-04: 20 mL
  Filled 2017-09-02: qty 40

## 2017-09-02 MED ORDER — ONDANSETRON HCL 4 MG/2ML IJ SOLN
4.0000 mg | Freq: Four times a day (QID) | INTRAMUSCULAR | Status: DC | PRN
  Administered 2017-09-02 (×3): 4 mg via INTRAVENOUS
  Filled 2017-09-02 (×4): qty 2

## 2017-09-02 MED ORDER — TRAMADOL HCL 50 MG PO TABS
50.0000 mg | ORAL_TABLET | Freq: Once | ORAL | Status: AC
  Administered 2017-09-02: 50 mg via ORAL
  Filled 2017-09-02: qty 1

## 2017-09-02 NOTE — Progress Notes (Addendum)
[facility] PROGRESS NOTE  Briana Sweeney HMC:[id number] DOB: Mar 18, 1971 DOA: 08/31/2017  PCP: Patient, No Pcp Per  Brief History/Interval Summary: 46 year old Caucasian female with a past medical history of alcohol abuse, liver cirrhosis who was recently hospitalized in November for alcoholic hepatitis and ascites.  She underwent paracentesis during the hospitalization.  She was seen by gastroenterology.  She was discharged on diuretics.  She presented today with complains of seizure type activity at home along with hematemesis.  She was noted to be withdrawing from alcohol.  She was hospitalized for further management.  Reason for Visit: Hematemesis.  Alcohol withdrawal.  Possible seizures.  Consultants: Gastroenterology  Procedures: None yet  Antibiotics: Ceftriaxone  Subjective/Interval History: Patient has not had any further seizure activity.  Overall she feels better today compared to yesterday.  Continues to be nauseated.  Denies any abdominal pain.    ROS: Denies any headaches.  Objective:  Vital Signs  Vitals:   09/01/17 1732 09/01/17 2050 09/01/17 2300 09/02/17 0533  BP: 112/78 104/65 104/65 112/74  Pulse: (!) 121 (!) 104 (!) 104 100  Resp:  17  18  Temp:  97.9 F (36.6 C)  97.9 F (36.6 C)  TempSrc:  Oral    SpO2:  99%  100%  Weight:      Height:        Intake/Output Summary (Last 24 hours) at 09/02/2017 0906 Last data filed at 09/02/2017 0700 Gross per 24 hour  Intake 734.17 ml  Output 3300 ml  Net -2565.83 ml   Filed Weights   08/31/17 1912 09/01/17 0200  Weight: 60.3 kg (133 lb) 58.3 kg (128 lb 8.5 oz)    General appearance: Awake alert.  In no distress.  Not as tremulous as yesterday. Resp: Clear to auscultation bilaterally Cardio: S1-S2 is tachycardic but better compared to yesterday.  No S3-S4.  No rubs murmurs or bruit GI: Abdomen remains soft.  Mildly tender in the epigastric area without any rebound rigidity or guarding.  No masses  organomegaly.   Extremities: No edema is noted.   Pulses: 2+ and symmetric Neurologic: Grossly normal  Lab Results:  Data Reviewed: I have personally reviewed following labs and imaging studies  CBC: Recent Labs  Lab 08/31/17 2001 09/01/17 0623 09/01/17 1411 09/02/17 0509  WBC 3.2* 2.0* 2.3* 2.1*  HGB 12.5 10.3* 10.7* 10.3*  HCT 37.6 31.5* 32.9* 31.6*  MCV 97.2 96.9 95.9 96.9  PLT PLATELET CLUMPING, SUGGEST RECOLLECTION OF SAMPLE IN CITRATE TUBE. PLATELET CLUMPING, SUGGEST RECOLLECTION OF SAMPLE IN CITRATE TUBE. 21* PLATELET CLUMPING, SUGGEST RECOLLECTION OF SAMPLE IN CITRATE TUBE.    Basic Metabolic Panel: Recent Labs  Lab 08/31/17 2001 09/01/17 0623 09/02/17 0509  NA 142 138 135  K 3.7 3.5 4.1  CL 106 106 103  CO2 19* 18* 20*  GLUCOSE 88 46* 138*  BUN 7 5* 5*  CREATININE 0.59 0.58 0.76  CALCIUM 8.7* 8.1* 8.1*    GFR: Estimated Creatinine Clearance: 70.9 mL/min (by C-G formula based on SCr of 0.76 mg/dL).  Liver Function Tests: Recent Labs  Lab 08/31/17 2001 09/01/17 0623 09/02/17 0509  AST 78* 64* 60*  ALT 19 17 17   ALKPHOS 160* 113 105  BILITOT 2.8* 2.3* 3.2*  PROT 9.2* 7.8 7.7  ALBUMIN 3.1* 2.5* 2.4*    Recent Labs  Lab 08/31/17 2000  LIPASE 72*    Coagulation Profile: Recent Labs  Lab 08/31/17 2000 09/01/17 0623  INR 1.29 1.45    CBG: Recent Labs  Lab  08/31/17 [place] 81    Radiology Studies: No results found.   Medications:  Scheduled: . busPIRone  15 mg Oral TID  . folic acid  1 mg Oral Daily  . Influenza vac split quadrivalent PF  0.5 mL Intramuscular Tomorrow-1000  . LORazepam  0-4 mg Intravenous Q6H   Followed by  . [START ON 09/03/2017] LORazepam  0-4 mg Intravenous Q12H  . LORazepam  1 mg Intravenous Once  . mouth rinse  15 mL Mouth Rinse BID  . multivitamin with minerals  1 tablet Oral Daily  . pantoprazole  40 mg Oral BID  . pentoxifylline  400 mg Oral TID WC  . phenytoin  300 mg Oral QHS  . pneumococcal  23 valent vaccine  0.5 mL Intramuscular Tomorrow-1000  . sodium chloride flush  3 mL Intravenous Q12H  . thiamine  100 mg Oral Daily   Or  . thiamine  100 mg Intravenous Daily  . topiramate  25 mg Oral BID   Continuous: . sodium chloride 50 mL/hr at 09/02/17 0238   NUU:[id number] **OR** LORazepam, ondansetron  Assessment/Plan:  Active Problems:   Upper GI bleed    Hematemesis This is in the setting of liver cirrhosis.  Hemoglobin low but stable.  Has not dropped any further.  Patient was initiated on PPI and octreotide infusion.  Gastroenterology was consulted.  Also placed on ceftriaxone for prophylaxis.  Octreotide and ceftriaxone discontinued yesterday by gastroenterology.  Has not had any further episodes of bleeding.  She mentioned that she did have a bowel movement yesterday which appeared to be brownish black in color.  She remains hemodynamically stable.  Gastroenterology to determine timing of endoscopy.   Alcoholic liver disease with ascites Hospitalized in November for alcoholic hepatitis.  She underwent paracentesis.  SBP was ruled out.  She was discharged home on diuretics.  She mentions that she has been compliant with same.  She was also discharged on pentoxifylline.  This is being continued.  Diuretics are on hold.  Hyperbilirubinemia is noted but is better compared to what it was last month.  Magnesium 1.7.  We will give her a dose of magnesium sulfate.  Alcohol abuse with alcohol withdrawal/possible withdrawal seizures No seizure activity noted in the hospital.  Patient was tachycardic.  She was initiated on the alcohol withdrawal protocol.  Continue thiamine, multivitamins and folic acid.  Seems to have improved.  Continue to monitor.   History of positive HCV antibody Management as outpatient.  History of seizure disorder She is noted to be on Dilantin and Topamax at home which is being continued.  See above as well.[name] Kitchen  History of anxiety disorder Continue  BuSpar.  Possible acute blood loss anemia Hemoglobin is lower compared to admission but that could have been hemoconcentrated.  Stable over the last 3 measurements.  Continue to monitor.    Leukopenia and thrombocytopenia Most likely due to alcohol abuse and liver disease.  Could also be due to Dilantin.  Will check level.  Platelet count noted to be low.  Continue to monitor.  Transfuse if she has bleeding. Continue to monitor.  DVT Prophylaxis: SCDs    Code Status: Full code Family Communication: Discussed with the patient Disposition Plan: Management as outlined above.    LOS: 1 day   [facility] Pager [phone] 09/02/2017, 9:06 AM  If 7PM-7AM, please contact night-coverage at [email or web address], password [email or web address]

## 2017-09-02 NOTE — Progress Notes (Signed)
Pt. Iv is pulled out by pt.

## 2017-09-02 NOTE — H&P (View-Only) (Signed)
[facility] Progress Note  Chief Complaint: Hematemesis  Subjective  History:  No further hematemesis since before admission Patient reportedly had a small black stool last evening. She denies chest pain, but reports chronic abdominal pain, generalized fatigue and headache. She is less anxious and tremulous today, and thus seems to be getting through alcohol withdrawal.  ROS: Cardiovascular:  no chest pain Respiratory: no dyspnea  Objective:  Med list reviewed  Vital signs in last 24 hrs: Vitals:   09/02/17 0533 09/02/17 1340  BP: 112/74 117/79  Pulse: 100 99  Resp: 18 18  Temp: 97.9 F (36.6 C) 97.6 F (36.4 C)  SpO2: 100% 99%    Physical Exam   HEENT: sclera anicteric, oral mucosa moist without lesions  Neck: supple, no thyromegaly, JVD or lymphadenopathy  Cardiac: RRR without murmurs, S1S2 heard, no peripheral edema  Pulm: clear to auscultation bilaterally, normal RR and effort noted  Abdomen: soft, mild epigastric tenderness, with active bowel sounds. No palpable hepatomegaly or spleen tip  Skin; warm and dry, no jaundice or rash  Recent Labs:  Recent Labs  Lab 09/01/17 0623 09/01/17 1411 09/02/17 0509  WBC 2.0* 2.3* 2.1*  HGB 10.3* 10.7* 10.3*  HCT 31.5* 32.9* 31.6*  PLT PLATELET CLUMPING, SUGGEST RECOLLECTION OF SAMPLE IN CITRATE TUBE. 21* PLATELET CLUMPING, SUGGEST RECOLLECTION OF SAMPLE IN CITRATE TUBE.   Recent Labs  Lab 09/02/17 0509  NA 135  K 4.1  CL 103  CO2 20*  BUN 5*  ALBUMIN 2.4*  ALKPHOS 105  ALT 17  AST 60*  GLUCOSE 138*   Recent Labs  Lab 09/01/17 0623  INR 1.45     @ASSESSMENTPLANBEGIN @ Assessment:  Hematemesis Marcello Moores self reported, none witnessed this admission. Probable gastric mucosal tear, possible Mallory-Weiss tear, not consistent with variceal bleeding. 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 Alcohol-related cirrhosis with portal hypertension, ascites and severe thrombocytopenia. Ongoing alcohol abuse up to  the time of admission   Plan: Upper endoscopy tomorrow. Although her hematemesis was rapidly low volume and stopped prior to admission and the hemoglobin is now stable, she also should be screened for esophageal varices. The procedure was ascribed in detail and she is agreeable.   The benefits and risks of the planned procedure were described in detail with the patient or (when appropriate) their health care proxy.  Risks were outlined as including, but not limited to, bleeding, infection, perforation, adverse medication reaction leading to cardiac or pulmonary decompensation, or pancreatitis (if ERCP).  The limitation of incomplete mucosal visualization was also discussed.  No guarantees or warranties were given.  She can have regular food today, then nothing by mouth after midnight.  Total 30 minutes time, over half spent counseling and coordinating care. Nelida Meuse III Pager [phone] Mon-Fri 8a-5p [phone] after 5p, weekends, holidays

## 2017-09-02 NOTE — Progress Notes (Signed)
[facility] Progress Note  Chief Complaint: Hematemesis  Subjective  History:  No further hematemesis since before admission Patient reportedly had a small black stool last evening. She denies chest pain, but reports chronic abdominal pain, generalized fatigue and headache. She is less anxious and tremulous today, and thus seems to be getting through alcohol withdrawal.  ROS: Cardiovascular:  no chest pain Respiratory: no dyspnea  Objective:  Med list reviewed  Vital signs in last 24 hrs: Vitals:   09/02/17 0533 09/02/17 1340  BP: 112/74 117/79  Pulse: 100 99  Resp: 18 18  Temp: 97.9 F (36.6 C) 97.6 F (36.4 C)  SpO2: 100% 99%    Physical Exam   HEENT: sclera anicteric, oral mucosa moist without lesions  Neck: supple, no thyromegaly, JVD or lymphadenopathy  Cardiac: RRR without murmurs, S1S2 heard, no peripheral edema  Pulm: clear to auscultation bilaterally, normal RR and effort noted  Abdomen: soft, mild epigastric tenderness, with active bowel sounds. No palpable hepatomegaly or spleen tip  Skin; warm and dry, no jaundice or rash  Recent Labs:  Recent Labs  Lab 09/01/17 0623 09/01/17 1411 09/02/17 0509  WBC 2.0* 2.3* 2.1*  HGB 10.3* 10.7* 10.3*  HCT 31.5* 32.9* 31.6*  PLT PLATELET CLUMPING, SUGGEST RECOLLECTION OF SAMPLE IN CITRATE TUBE. 21* PLATELET CLUMPING, SUGGEST RECOLLECTION OF SAMPLE IN CITRATE TUBE.   Recent Labs  Lab 09/02/17 0509  NA 135  K 4.1  CL 103  CO2 20*  BUN 5*  ALBUMIN 2.4*  ALKPHOS 105  ALT 17  AST 60*  GLUCOSE 138*   Recent Labs  Lab 09/01/17 0623  INR 1.45     @ASSESSMENTPLANBEGIN @ Assessment:  Hematemesis Marcello Moores self reported, none witnessed this admission. Probable gastric mucosal tear, possible Mallory-Weiss tear, not consistent with variceal bleeding. 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 Alcohol-related cirrhosis with portal hypertension, ascites and severe thrombocytopenia. Ongoing alcohol abuse up to  the time of admission   Plan: Upper endoscopy tomorrow. Although her hematemesis was rapidly low volume and stopped prior to admission and the hemoglobin is now stable, she also should be screened for esophageal varices. The procedure was ascribed in detail and she is agreeable.   The benefits and risks of the planned procedure were described in detail with the patient or (when appropriate) their health care proxy.  Risks were outlined as including, but not limited to, bleeding, infection, perforation, adverse medication reaction leading to cardiac or pulmonary decompensation, or pancreatitis (if ERCP).  The limitation of incomplete mucosal visualization was also discussed.  No guarantees or warranties were given.  She can have regular food today, then nothing by mouth after midnight.  Total 30 minutes time, over half spent counseling and coordinating care. Nelida Meuse III Pager [phone] Mon-Fri 8a-5p [phone] after 5p, weekends, holidays

## 2017-09-03 ENCOUNTER — Inpatient Hospital Stay (HOSPITAL_COMMUNITY): Payer: Self-pay | Admitting: Certified Registered Nurse Anesthetist

## 2017-09-03 ENCOUNTER — Encounter (HOSPITAL_COMMUNITY)
Admission: EM | Disposition: A | Discharge status: Home / Self Care | Payer: Self-pay | Source: Home / Self Care | Attending: Internal Medicine

## 2017-09-03 ENCOUNTER — Encounter (HOSPITAL_COMMUNITY): Payer: Self-pay

## 2017-09-03 DIAGNOSIS — K7460 Unspecified cirrhosis of liver: Secondary | ICD-10-CM | POA: Diagnosis not present

## 2017-09-03 DIAGNOSIS — D61818 Other pancytopenia: Secondary | ICD-10-CM | POA: Diagnosis not present

## 2017-09-03 DIAGNOSIS — K3189 Other diseases of stomach and duodenum: Secondary | ICD-10-CM | POA: Diagnosis not present

## 2017-09-03 DIAGNOSIS — K766 Portal hypertension: Secondary | ICD-10-CM

## 2017-09-03 DIAGNOSIS — F1010 Alcohol abuse, uncomplicated: Secondary | ICD-10-CM | POA: Diagnosis not present

## 2017-09-03 DIAGNOSIS — K922 Gastrointestinal hemorrhage, unspecified: Secondary | ICD-10-CM | POA: Diagnosis not present

## 2017-09-03 DIAGNOSIS — I8500 Esophageal varices without bleeding: Secondary | ICD-10-CM

## 2017-09-03 HISTORY — PX: ESOPHAGOGASTRODUODENOSCOPY: SHX5428

## 2017-09-03 LAB — CBC
HCT: 31.6 % — ABNORMAL LOW (ref 36.0–46.0)
Hemoglobin: 10.2 g/dL — ABNORMAL LOW (ref 12.0–15.0)
MCH: 31.1 pg (ref 26.0–34.0)
MCHC: 32.3 g/dL (ref 30.0–36.0)
MCV: 96.3 fL (ref 78.0–100.0)
RBC: 3.28 MIL/uL — ABNORMAL LOW (ref 3.87–5.11)
RDW: 16.1 % — ABNORMAL HIGH (ref 11.5–15.5)
WBC: 2.2 10*3/uL — ABNORMAL LOW (ref 4.0–10.5)

## 2017-09-03 LAB — COMPREHENSIVE METABOLIC PANEL
ALT: 17 U/L (ref 14–54)
AST: 57 U/L — ABNORMAL HIGH (ref 15–41)
Albumin: 2.5 g/dL — ABNORMAL LOW (ref 3.5–5.0)
Alkaline Phosphatase: 125 U/L (ref 38–126)
Anion gap: 6 (ref 5–15)
BUN: <5 mg/dL — ABNORMAL LOW (ref 6–20)
CO2: 23 mmol/L (ref 22–32)
Calcium: 8 mg/dL — ABNORMAL LOW (ref 8.9–10.3)
Chloride: 108 mmol/L (ref 101–111)
Creatinine, Ser: 0.71 mg/dL (ref 0.44–1.00)
GFR calc Af Amer: >60 mL/min (ref >60)
GFR calc non Af Amer: >60 mL/min (ref >60)
Glucose, Bld: 130 mg/dL — ABNORMAL HIGH (ref 65–99)
Potassium: 3.5 mmol/L (ref 3.5–5.1)
Sodium: 137 mmol/L (ref 135–145)
Total Bilirubin: 2.5 mg/dL — ABNORMAL HIGH (ref 0.3–1.2)
Total Protein: 8 g/dL (ref 6.5–8.1)

## 2017-09-03 LAB — PHENYTOIN LEVEL, TOTAL: Phenytoin Lvl: 8.9 ug/mL — ABNORMAL LOW (ref 10.0–20.0)

## 2017-09-03 SURGERY — EGD (ESOPHAGOGASTRODUODENOSCOPY)
Anesthesia: Monitor Anesthesia Care

## 2017-09-03 MED ORDER — ADULT MULTIVITAMIN W/MINERALS CH: 1.0000 | ORAL_TABLET | Freq: Every day | ORAL | 0 refills | Status: DC

## 2017-09-03 MED ORDER — FUROSEMIDE 20 MG PO TABS: 20.0000 mg | ORAL_TABLET | Freq: Every day | ORAL | 2 refills | Status: DC

## 2017-09-03 MED ORDER — PROPOFOL 10 MG/ML IV BOLUS
INTRAVENOUS | Status: DC | PRN
  Administered 2017-09-03: 80 mg via INTRAVENOUS

## 2017-09-03 MED ORDER — GLYCOPYRROLATE 0.2 MG/ML IV SOSY
PREFILLED_SYRINGE | INTRAVENOUS | Status: DC | PRN
  Administered 2017-09-03: .2 mg via INTRAVENOUS

## 2017-09-03 MED ORDER — MORPHINE SULFATE (PF) 4 MG/ML IV SOLN
1.0000 mg | Freq: Once | INTRAVENOUS | Status: AC
  Administered 2017-09-03: 1 mg via INTRAVENOUS
  Filled 2017-09-03: qty 1

## 2017-09-03 MED ORDER — PHENYTOIN SODIUM EXTENDED 100 MG PO CAPS: 300.0000 mg | ORAL_CAPSULE | Freq: Every day | ORAL | 0 refills | Status: DC

## 2017-09-03 MED ORDER — SPIRONOLACTONE 50 MG PO TABS: 50.0000 mg | ORAL_TABLET | Freq: Every day | ORAL | 2 refills | Status: DC

## 2017-09-03 MED ORDER — TOPIRAMATE 25 MG PO TABS: 25.0000 mg | ORAL_TABLET | Freq: Two times a day (BID) | ORAL | 0 refills | Status: DC

## 2017-09-03 MED ORDER — MIDAZOLAM HCL 2 MG/2ML IJ SOLN
INTRAMUSCULAR | Status: DC | PRN
  Administered 2017-09-03: 2 mg via INTRAVENOUS

## 2017-09-03 MED ORDER — THIAMINE HCL 100 MG PO TABS: 100.0000 mg | ORAL_TABLET | Freq: Every day | ORAL | 0 refills | Status: DC

## 2017-09-03 MED ORDER — CHLORDIAZEPOXIDE HCL 25 MG PO CAPS: ORAL_CAPSULE | ORAL | 0 refills | Status: DC

## 2017-09-03 MED ORDER — LIDOCAINE 2% (20 MG/ML) 5 ML SYRINGE
INTRAMUSCULAR | Status: DC | PRN
  Administered 2017-09-03: 80 mg via INTRAVENOUS

## 2017-09-03 MED ORDER — PENTOXIFYLLINE ER 400 MG PO TBCR: 400.0000 mg | EXTENDED_RELEASE_TABLET | Freq: Three times a day (TID) | ORAL | 2 refills | Status: DC

## 2017-09-03 MED ORDER — FOLIC ACID 1 MG PO TABS: 1.0000 mg | ORAL_TABLET | Freq: Every day | ORAL | 0 refills | Status: DC

## 2017-09-03 NOTE — Social Work (Signed)
CSW consulted to provide transportation support.   Pt states that she does not have any money or connections to pick her up from the hospital. Pt is discharging back to address in [place]: [address], [place], [place] [address]. Pt has key to enter home.   CSW able to provide pt with voucher for [place] signing off. Please consult if any additional needs arise.  Alexander Mt, [place] Work [phone]

## 2017-09-03 NOTE — Interval H&P Note (Signed)
History and Physical Interval Note:  09/03/2017 1:49 PM  Briana Sweeney  has presented today for surgery, with the diagnosis of hematemesis  The various methods of treatment have been discussed with the patient and family. After consideration of risks, benefits and other options for treatment, the patient has consented to  Procedure(s): ESOPHAGOGASTRODUODENOSCOPY (EGD) (N/A) as a surgical intervention .  The patient's history has been reviewed, patient examined, no change in status, stable for surgery.  I have reviewed the patient's chart and labs.  Questions were answered to the patient's satisfaction.     Nelida Meuse III

## 2017-09-03 NOTE — Discharge Instructions (Signed)
Esophageal Varices The esophagus is the passage that connects the throat to the stomach. Esophageal varices are blood vessels in the esophagus that have become enlarged. They develop when extra blood is forced to flow through them because the blood's normal pathway is blocked. Without treatment these blood vessels eventually break and bleed (hemorrhage). A hemorrhage is life-threatening. What are the causes? This condition may be caused by:  Scarring of the liver (cirrhosis) due to alcoholism. This is the most common cause.  Liver disease.  Severe heart failure.  A blood clot in the portal vein.  Sarcoidosis. This is an inflammatory disease that can affect the liver.  Schistosomiasis. This is a parasitic infection that can cause liver damage.  What are the signs or symptoms? Usually there are no symptoms unless the esophageal varices bleed. Symptoms of bleeding esophageal varices include:  Vomiting material that is bright red or that is black and looks like coffee grounds.  Coughing up blood.  Black, tarry stools.  Dizziness or lightheadedness.  Low blood pressure.  Loss of consciousness.  How is this diagnosed? This condition is diagnosed with tests, such as:  Endoscopy. During this test a thin, lighted tube is inserted through the mouth and into the esophagus.  Blood tests. These may be done to check liver function, blood counts, and the body's ability to form blood clots.  How is this treated? This condition may be treated with:  Medicines that reduce pressure in the esophageal varices and reduce the risk of bleeding.  Procedures to reduce pressure in the esophageal varices and reduce the risk of bleeding or stop bleeding. These include: ? Variceal ligation. In this procedure, a rubber band is placed around the esophageal varices to keep them from bleeding. ? Injection therapy. This treatment involves an injection that causes the esophageal varices to shrink and  close (sclerotherapy). Medicines that tighten blood vessels or alter blood flow may also be used. ? Balloon tamponade. In this procedure, a tube is put into the esophagus and a balloon is passed through it and inflated. ? Transjugular intrahepatic portosystemic shunt (TIPS) placement. In this procedure, a small tube is placed within the liver veins. This decreases blood flow and pressure to the esophageal varices.  A liver transplant. This may be done if other treatments do not work.  Follow these instructions at home:  Take medicines only as directed by your health care provider.  Follow your health care provider's instructions about rest and physical activity. Get help right away if:  You have any symptoms of this condition after treatment.  You are unable to eat or drink.  You have chest pain. This information is not intended to replace advice given to you by your health care provider. Make sure you discuss any questions you have with your health care provider. Document Released: 11/16/2003 Document Revised: 02/01/2016 Document Reviewed: 08/22/2014 Elsevier Interactive Patient Education  2018 [employer].   Cirrhosis Cirrhosis is long-term (chronic) liver injury. The liver is your largest internal organ, and it performs many functions. The liver converts food into energy, removes toxic material from your blood, makes important proteins, and absorbs necessary vitamins from your diet. If you have cirrhosis, it means many of your healthy liver cells have been replaced by scar tissue. This prevents blood from flowing through your liver, which makes it difficult for your liver to function. This scarring is not reversible, but treatment can prevent it from getting worse. What are the causes? Hepatitis C and long-term alcohol  abuse are the most common causes of cirrhosis. Other causes include:  Nonalcoholic fatty liver disease.  Hepatitis B infection.  Autoimmune hepatitis.  Diseases  that cause blockage of ducts inside the liver.  Inherited liver diseases.  Reactions to certain long-term medicines.  Parasitic infections.  Long-term exposure to certain toxins.  What increases the risk? You may have a higher risk of cirrhosis if you:  Have certain hepatitis viruses.  Abuse alcohol, especially if you are female.  Are overweight.  Share needles.  Have unprotected sex with someone who has hepatitis.  What are the signs or symptoms? You may not have any signs and symptoms at first. Symptoms may not develop until the damage to your liver starts to get worse. Signs and symptoms of cirrhosis may include:  Tenderness in the right-upper part of your abdomen.  Weakness and tiredness (fatigue).  Loss of appetite.  Nausea.  Weight loss and muscle loss.  Itchiness.  Yellow skin and eyes (jaundice).  Buildup of fluid in the abdomen (ascites).  Swelling of the feet and ankles (edema).  Appearance of tiny blood vessels under the skin.  Mental confusion.  Easy bruising and bleeding.  How is this diagnosed? Your health care provider may suspect cirrhosis based on your symptoms and medical history, especially if you have other medical conditions or a history of alcohol abuse. Your health care provider will do a physical exam to feel your liver and check for signs of cirrhosis. Your health care provider may perform other tests, including:  Blood tests to check: ? Whether you have hepatitis B or C. ? Kidney function. ? Liver function.  Imaging tests such as: ? MRI or CT scan to look for changes seen in advanced cirrhosis. ? Ultrasound to see if normal liver tissue is being replaced by scar tissue.  A procedure using a long needle to take a sample of liver tissue (biopsy) for examination under a microscope. Liver biopsy can confirm the diagnosis of cirrhosis.  How is this treated? Treatment depends on how damaged your liver is and what caused the damage.  Treatment may include treating cirrhosis symptoms or treating the underlying causes of the condition to try to slow the progression of the damage. Treatment may include:  Making lifestyle changes, such as: ? Eating a healthy diet. ? Restricting salt intake. ? Maintaining a healthy weight. ? Not abusing drugs or alcohol.  Taking medicines to: ? Treat liver infections or other infections. ? Control itching. ? Reduce fluid buildup. ? Reduce certain blood toxins. ? Reduce risk of bleeding from enlarged blood vessels in the stomach or esophagus (varices).  If varices are causing bleeding problems, you may need treatment with a procedure that ties up the vessels causing them to fall off (band ligation).  If cirrhosis is causing your liver to fail, your health care provider may recommend a liver transplant.  Other treatments may be recommended depending on any complications of cirrhosis, such as liver-related kidney failure (hepatorenal syndrome).  Follow these instructions at home:  Take medicines only as directed by your health care provider. Do not use drugs that are toxic to your liver. Ask your health care provider before taking any new medicines, including over-the-counter medicines.  Rest as needed.  Eat a well-balanced diet. Ask your health care provider or dietitian for more information.  You may have to follow a low-salt diet or restrict your water intake as directed.  Do not drink alcohol. This is especially important if you are taking  acetaminophen.  Keep all follow-up visits as directed by your health care provider. This is important. Contact a health care provider if:  You have fatigue or weakness that is getting worse.  You develop swelling of the hands, feet, legs, or face.  You have a fever.  You develop loss of appetite.  You have nausea or vomiting.  You develop jaundice.  You develop easy bruising or bleeding. Get help right away if:  You vomit bright  red blood or a material that looks like coffee grounds.  You have blood in your stools.  Your stools appear black and tarry.  You become confused.  You have chest pain or trouble breathing. This information is not intended to replace advice given to you by your health care provider. Make sure you discuss any questions you have with your health care provider. Document Released: 08/26/2005 Document Revised: 01/04/2016 Document Reviewed: 05/04/2014 Elsevier Interactive Patient Education  2018 [employer].   Cirrhosis Cirrhosis is long-term (chronic) liver injury. The liver is your largest internal organ, and it performs many functions. The liver converts food into energy, removes toxic material from your blood, makes important proteins, and absorbs necessary vitamins from your diet. If you have cirrhosis, it means many of your healthy liver cells have been replaced by scar tissue. This prevents blood from flowing through your liver, which makes it difficult for your liver to function. This scarring is not reversible, but treatment can prevent it from getting worse. What are the causes? Hepatitis C and long-term alcohol abuse are the most common causes of cirrhosis. Other causes include:  Nonalcoholic fatty liver disease.  Hepatitis B infection.  Autoimmune hepatitis.  Diseases that cause blockage of ducts inside the liver.  Inherited liver diseases.  Reactions to certain long-term medicines.  Parasitic infections.  Long-term exposure to certain toxins.  What increases the risk? You may have a higher risk of cirrhosis if you:  Have certain hepatitis viruses.  Abuse alcohol, especially if you are female.  Are overweight.  Share needles.  Have unprotected sex with someone who has hepatitis.  What are the signs or symptoms? You may not have any signs and symptoms at first. Symptoms may not develop until the damage to your liver starts to get worse. Signs and symptoms of  cirrhosis may include:  Tenderness in the right-upper part of your abdomen.  Weakness and tiredness (fatigue).  Loss of appetite.  Nausea.  Weight loss and muscle loss.  Itchiness.  Yellow skin and eyes (jaundice).  Buildup of fluid in the abdomen (ascites).  Swelling of the feet and ankles (edema).  Appearance of tiny blood vessels under the skin.  Mental confusion.  Easy bruising and bleeding.  How is this diagnosed? Your health care provider may suspect cirrhosis based on your symptoms and medical history, especially if you have other medical conditions or a history of alcohol abuse. Your health care provider will do a physical exam to feel your liver and check for signs of cirrhosis. Your health care provider may perform other tests, including:  Blood tests to check: ? Whether you have hepatitis B or C. ? Kidney function. ? Liver function.  Imaging tests such as: ? MRI or CT scan to look for changes seen in advanced cirrhosis. ? Ultrasound to see if normal liver tissue is being replaced by scar tissue.  A procedure using a long needle to take a sample of liver tissue (biopsy) for examination under a microscope. Liver biopsy can confirm the  diagnosis of cirrhosis.  How is this treated? Treatment depends on how damaged your liver is and what caused the damage. Treatment may include treating cirrhosis symptoms or treating the underlying causes of the condition to try to slow the progression of the damage. Treatment may include:  Making lifestyle changes, such as: ? Eating a healthy diet. ? Restricting salt intake. ? Maintaining a healthy weight. ? Not abusing drugs or alcohol.  Taking medicines to: ? Treat liver infections or other infections. ? Control itching. ? Reduce fluid buildup. ? Reduce certain blood toxins. ? Reduce risk of bleeding from enlarged blood vessels in the stomach or esophagus (varices).  If varices are causing bleeding problems, you may  need treatment with a procedure that ties up the vessels causing them to fall off (band ligation).  If cirrhosis is causing your liver to fail, your health care provider may recommend a liver transplant.  Other treatments may be recommended depending on any complications of cirrhosis, such as liver-related kidney failure (hepatorenal syndrome).  Follow these instructions at home:  Take medicines only as directed by your health care provider. Do not use drugs that are toxic to your liver. Ask your health care provider before taking any new medicines, including over-the-counter medicines.  Rest as needed.  Eat a well-balanced diet. Ask your health care provider or dietitian for more information.  You may have to follow a low-salt diet or restrict your water intake as directed.  Do not drink alcohol. This is especially important if you are taking acetaminophen.  Keep all follow-up visits as directed by your health care provider. This is important. Contact a health care provider if:  You have fatigue or weakness that is getting worse.  You develop swelling of the hands, feet, legs, or face.  You have a fever.  You develop loss of appetite.  You have nausea or vomiting.  You develop jaundice.  You develop easy bruising or bleeding. Get help right away if:  You vomit bright red blood or a material that looks like coffee grounds.  You have blood in your stools.  Your stools appear black and tarry.  You become confused.  You have chest pain or trouble breathing. This information is not intended to replace advice given to you by your health care provider. Make sure you discuss any questions you have with your health care provider. Document Released: 08/26/2005 Document Revised: 01/04/2016 Document Reviewed: 05/04/2014 Elsevier Interactive Patient Education  2018 [employer].   Alcohol Use Disorder Alcohol use disorder is when your drinking disrupts your daily life. When  you have this condition, you drink too much alcohol and you cannot control your drinking. Alcohol use disorder can cause serious problems with your physical health. It can affect your brain, heart, liver, pancreas, immune system, stomach, and intestines. Alcohol use disorder can increase your risk for certain cancers and cause problems with your mental health, such as depression, anxiety, psychosis, delirium, and dementia. People with this disorder risk hurting themselves and others. What are the causes? This condition is caused by drinking too much alcohol over time. It is not caused by drinking too much alcohol only one or two times. Some people with this condition drink alcohol to cope with or escape from negative life events. Others drink to relieve pain or symptoms of mental illness. What increases the risk? You are more likely to develop this condition if:  You have a family history of alcohol use disorder.  Your culture encourages drinking to  the point of intoxication, or makes alcohol easy to get.  You had a mood or conduct disorder in childhood.  You have been a victim of abuse.  You are an adolescent and: ? You have poor grades or difficulties in school. ? Your caregivers do not talk to you about saying no to alcohol, or supervise your activities. ? You are impulsive or you have trouble with self-control.  What are the signs or symptoms? Symptoms of this condition include:  Drinkingmore than you want to.  Drinking for longer than you want to.  Trying several times to drink less or to control your drinking.  Spending a lot of time getting alcohol, drinking, or recovering from drinking.  Craving alcohol.  Having problems at work, at school, or at home due to drinking.  Having problems in relationships due to drinking.  Drinking when it is dangerous to drink, such as before driving a car.  Continuing to drink even though you know you might have a physical or mental  problem related to drinking.  Needing more and more alcohol to get the same effect you want from the alcohol (building up tolerance).  Having symptoms of withdrawal when you stop drinking. Symptoms of withdrawal include: ? Fatigue. ? Nightmares. ? Trouble sleeping. ? Depression. ? Anxiety. ? Fever. ? Seizures. ? Severe confusion. ? Feeling or seeing things that are not there (hallucinations). ? Tremors. ? Rapid heart rate. ? Rapid breathing. ? High blood pressure.  Drinking to avoid symptoms of withdrawal.  How is this diagnosed? This condition is diagnosed with an assessment. Your health care provider may start the assessment by asking three or four questions about your drinking. Your health care provider may perform a physical exam or do lab tests to see if you have physical problems resulting from alcohol use. She or he may refer you to a mental health professional for evaluation. How is this treated? Some people with alcohol use disorder are able to reduce their alcohol use to low-risk levels. Others need to completely quit drinking alcohol. When necessary, mental health professionals with specialized training in substance use treatment can help. Your health care provider can help you decide how severe your alcohol use disorder is and what type of treatment you need. The following forms of treatment are available:  Detoxification. Detoxification involves quitting drinking and using prescription medicines within the first week to help lessen withdrawal symptoms. This treatment is important for people who have had withdrawal symptoms before and for heavy drinkers who are likely to have withdrawal symptoms. Alcohol withdrawal can be dangerous, and in severe cases, it can cause death. Detoxification may be provided in a home, community, or primary care setting, or in a hospital or substance use treatment facility.  Counseling. This treatment is also called talk therapy. It is provided by  substance use treatment counselors. A counselor can address the reasons you use alcohol and suggest ways to keep you from drinking again or to prevent problem drinking. The goals of talk therapy are to: ? Find healthy activities and ways for you to cope with stress. ? Identify and avoid the things that trigger your alcohol use. ? Help you learn how to handle cravings.  Medicines.Medicines can help treat alcohol use disorder by: ? Decreasing alcohol cravings. ? Decreasing the positive feeling you have when you drink alcohol. ? Causing an uncomfortable physical reaction when you drink alcohol (aversion therapy).  Support groups. Support groups are led by people who have quit drinking. They  provide emotional support, advice, and guidance.  These forms of treatment are often combined. Some people with this condition benefit from a combination of treatments provided by specialized substance use treatment centers. Follow these instructions at home:  Take over-the-counter and prescription medicines only as told by your health care provider.  Check with your health care provider before starting any new medicines.  Ask friends and family members not to offer you alcohol.  Avoid situations where alcohol is served, including gatherings where others are drinking alcohol.  Create a plan for what to do when you are tempted to use alcohol.  Find hobbies or activities that you enjoy that do not include alcohol.  Keep all follow-up visits as told by your health care provider. This is important. How is this prevented?  If you drink, limit alcohol intake to no more than 1 drink a day for nonpregnant women and 2 drinks a day for men. One drink equals 12 oz of beer, 5 oz of wine, or 1 oz of hard liquor.  If you have a mental health condition, get treatment and support.  Do not give alcohol to adolescents.  If you are an adolescent: ? Do not drink alcohol. ? Do not be afraid to say no if someone  offers you alcohol. Speak up about why you do not want to drink. You can be a positive role model for your friends and set a good example for those around you by not drinking alcohol. ? If your friends drink, spend time with others who do not drink alcohol. Make new friends who do not use alcohol. ? Find healthy ways to manage stress and emotions, such as meditation or deep breathing, exercise, spending time in nature, listening to music, or talking with a trusted friend or family member. Contact a health care provider if:  You are not able to take your medicines as told.  Your symptoms get worse.  You return to drinking alcohol (relapse) and your symptoms get worse. Get help right away if:  You have thoughts about hurting yourself or others. If you ever feel like you may hurt yourself or others, or have thoughts about taking your own life, get help right away. You can go to your nearest emergency department or call:  Your local emergency services (911 in the [place]  A suicide crisis helpline, such as the [facility] at 956-114-6743. This is open 24 hours a day.  Summary  Alcohol use disorder is when your drinking disrupts your daily life. When you have this condition, you drink too much alcohol and you cannot control your drinking.  Treatment may include detoxification, counseling, medicine, and support groups.  Ask friends and family members not to offer you alcohol. Avoid situations where alcohol is served.  Get help right away if you have thoughts about hurting yourself or others. This information is not intended to replace advice given to you by your health care provider. Make sure you discuss any questions you have with your health care provider. Document Released: 10/03/2004 Document Revised: 05/23/2016 Document Reviewed: 05/23/2016 Elsevier Interactive Patient Education  [employer].

## 2017-09-03 NOTE — Progress Notes (Signed)
[facility] PROGRESS NOTE  Briana Sweeney YKD:[id number] DOB: 08/25/1971 DOA: 08/31/2017  PCP: Patient, No Pcp Per  Brief History/Interval Summary: 46 year old Caucasian female with a past medical history of alcohol abuse, liver cirrhosis who was recently hospitalized in November for alcoholic hepatitis and ascites.  She underwent paracentesis during the hospitalization.  She was seen by gastroenterology.  She was discharged on diuretics.  She presented with complains of seizure type activity at home along with hematemesis.  She was noted to be withdrawing from alcohol.  She was hospitalized for further management.  Reason for Visit: Hematemesis.  Alcohol withdrawal.  Possible seizures.  Consultants: Gastroenterology  Procedures: EGD is planned today  Antibiotics: Ceftriaxone was given initially. Subsequently discontinued.  Subjective/Interval History: Overall patient does show improvement.  Complains of abdominal pain with some nausea.  No vomiting has been noted.  Has not had any bowel movement since yesterday morning. Passing gas.  Requesting help with alcohol rehab.  Anxious about her procedure today.    ROS: Denies any headaches.  Objective:  Vital Signs  Vitals:   09/02/17 0533 09/02/17 1340 09/02/17 2127 09/03/17 0535  BP: 112/74 117/79 114/74 117/84  Pulse: 100 99 (!) 112 97  Resp: 18 18  17   Temp: 97.9 F (36.6 C) 97.6 F (36.4 C) 98.5 F (36.9 C) 98.4 F (36.9 C)  TempSrc:  Oral Oral Oral  SpO2: 100% 99% 94% 97%  Weight:      Height:        Intake/Output Summary (Last 24 hours) at 09/03/2017 0903 Last data filed at 09/03/2017 3976 Gross per 24 hour  Intake 400 ml  Output 2550 ml  Net -2150 ml   Filed Weights   08/31/17 1912 09/01/17 0200  Weight: 60.3 kg (133 lb) 58.3 kg (128 lb 8.5 oz)    General appearance: Awake alert.  In no distress.  Very mild tremors. Resp: Clear to auscultation bilaterally. Cardio: S1-S2 is now normal regular.   Tachycardia has resolved.  No S3 is 4.   GI: Abdomen is soft.  Mildly tender in the epigastric area without any rebound rigidity or guarding.  No masses organomegaly.  Bowel sounds are present.   Neurologic: No focal neurological deficits.  Lab Results:  Data Reviewed: I have personally reviewed following labs and imaging studies  CBC: Recent Labs  Lab 08/31/17 2001 09/01/17 0623 09/01/17 1411 09/02/17 0509 09/03/17 0259  WBC 3.2* 2.0* 2.3* 2.1* 2.2*  HGB 12.5 10.3* 10.7* 10.3* 10.2*  HCT 37.6 31.5* 32.9* 31.6* 31.6*  MCV 97.2 96.9 95.9 96.9 96.3  PLT PLATELET CLUMPING, SUGGEST RECOLLECTION OF SAMPLE IN CITRATE TUBE. PLATELET CLUMPING, SUGGEST RECOLLECTION OF SAMPLE IN CITRATE TUBE. 21* PLATELET CLUMPING, SUGGEST RECOLLECTION OF SAMPLE IN CITRATE TUBE. PLATELET CLUMPING, SUGGEST RECOLLECTION OF SAMPLE IN CITRATE TUBE.    Basic Metabolic Panel: Recent Labs  Lab 08/31/17 2001 09/01/17 0623 09/02/17 0509 09/02/17 0920 09/03/17 0259  NA 142 138 135  --  137  K 3.7 3.5 4.1  --  3.5  CL 106 106 103  --  108  CO2 19* 18* 20*  --  23  GLUCOSE 88 46* 138*  --  130*  BUN 7 5* 5*  --  <5*  CREATININE 0.59 0.58 0.76  --  0.71  CALCIUM 8.7* 8.1* 8.1*  --  8.0*  MG  --   --   --  1.7  --     GFR: Estimated Creatinine Clearance: 70.9 mL/min (by C-G formula based on  SCr of 0.71 mg/dL).  Liver Function Tests: Recent Labs  Lab 08/31/17 2001 09/01/17 0623 09/02/17 0509 09/03/17 0259  AST 78* 64* 60* 57*  ALT 19 17 17 17   ALKPHOS 160* 113 105 125  BILITOT 2.8* 2.3* 3.2* 2.5*  PROT 9.2* 7.8 7.7 8.0  ALBUMIN 3.1* 2.5* 2.4* 2.5*    Recent Labs  Lab 08/31/17 2000  LIPASE 72*    Coagulation Profile: Recent Labs  Lab 08/31/17 2000 09/01/17 0623  INR 1.29 1.45    CBG: Recent Labs  Lab 08/31/17 [place] 81    Radiology Studies: No results found.   Medications:  Scheduled: . busPIRone  15 mg Oral TID  . folic acid  1 mg Oral Daily  . Influenza vac split  quadrivalent PF  0.5 mL Intramuscular Tomorrow-1000  . LORazepam  0-4 mg Intravenous Q12H  . LORazepam  1 mg Intravenous Once  . mouth rinse  15 mL Mouth Rinse BID  . multivitamin with minerals  1 tablet Oral Daily  . pantoprazole  40 mg Oral BID  . pentoxifylline  400 mg Oral TID WC  . phenytoin  300 mg Oral QHS  . pneumococcal 23 valent vaccine  0.5 mL Intramuscular Tomorrow-1000  . sodium chloride flush  3 mL Intravenous Q12H  . thiamine  100 mg Oral Daily   Or  . thiamine  100 mg Intravenous Daily  . topiramate  25 mg Oral BID   Continuous: . sodium chloride 50 mL/hr at 09/03/17 0600   YIR:SWNIOEVOJ **OR** LORazepam, ondansetron, sodium chloride flush  Assessment/Plan:  Active Problems:   Upper GI bleed    Hematemesis This is in the setting of liver cirrhosis.  Hemoglobin low but stable.  Has not dropped any further.  Patient was gently initiated on PPI and octreotide infusion.   Also placed on ceftriaxone for prophylaxis. Gastroenterology was consulted.  Octreotide and ceftriaxone discontinued by gastroenterology.  Has not had any further episodes of bleeding.  Patient to undergo upper endoscopy today.  Alcoholic liver disease with ascites Hospitalized in November for alcoholic hepatitis.  She underwent paracentesis.  SBP was ruled out.  She was discharged home on diuretics.  She mentions that she has been compliant with same.  She was also discharged on pentoxifylline.  This is being continued.  Diuretics are on hold.  Hyperbilirubinemia is noted but is better compared to what it was last month.  Magnesium was 1.7.  She was given a dose of magnesium sulfate.  Alcohol abuse with alcohol withdrawal/possible withdrawal seizures No seizure activity noted in the hospital.  Patient was tachycardic.  She was initiated on the alcohol withdrawal protocol.  Continue thiamine, multivitamins and folic acid.  Withdrawal symptoms have significantly improved.  Close to baseline.  Mobilize.   Seems to have improved.  Continue to monitor.  Consult social worker to assist with alcohol rehab.  History of positive HCV antibody Management as outpatient.  History of seizure disorder She is noted to be on Dilantin and Topamax at home which is being continued.  See above as well.[name] Kitchen  History of anxiety disorder Continue BuSpar.  Possible acute blood loss anemia Hemoglobin is lower compared to admission but that could have been hemoconcentrated.  Stable over the last 4 measurements.  Continue to monitor.    Leukopenia and thrombocytopenia Most likely due to alcohol abuse and liver disease.  Could also be due to Dilantin.  Platelet count noted to be low.  Continue to monitor.  Transfuse if  she has bleeding. Continue to monitor.  DVT Prophylaxis: SCDs    Code Status: Full code Family Communication: Discussed with the patient Disposition Plan: Mobilize.  Endoscopy today.  Possible discharge in 1-2 days.    LOS: 2 days   [facility] Pager [phone] 09/03/2017, 9:03 AM  If 7PM-7AM, please contact night-coverage at [email or web address], password [facility]

## 2017-09-03 NOTE — Progress Notes (Signed)
Initial Nutrition Assessment  DOCUMENTATION CODES:   Not applicable  INTERVENTION:   -RD will follow for diet advancement and supplement as appropriate  NUTRITION DIAGNOSIS:   Increased nutrient needs related to chronic illness(ETOH hepatitis) as evidenced by estimated needs.  GOAL:   Patient will meet greater than or equal to 90% of their needs  MONITOR:   PO intake, Supplement acceptance, Diet advancement, Labs, Weight trends, Skin, I & O's  REASON FOR ASSESSMENT:   Malnutrition Screening Tool    ASSESSMENT:   This is a 46 year old woman with ongoing alcohol abuse and cirrhosis who was admitted in November for alcoholic hepatitis and ascites who is 4 presenting with a report of hematemesis, as well as self reported seizure-like activity.  Pt admitted with hematemesis in the setting of protracted vomiting from ETOH abuse.   Spoke with pt, who endorses a general decline in health over the past 4 months, which she reports is related to grief as a result of her boyfriends's death. Since this time, her alcohol intake has significantly increased. Pt estimates she consumed 1/2 gallon of vodka every 1-2 days. Pt shares she eats very little food due to GI distress and ETOH intake (food intake is very small, usually a bowl of soup or 1-2 chicken legs).   Pt reports UBW is around 145#, which she last weighed approximately 4 months ago. Per wt hx, pt has experienced a 6.6% wt loss over the past month, which is significant for time frame. She also reports limited mobility, stating her only activity is taking a shower. Mild muscle depletion on lower extremities may be consistent with minimal activity.   Pt is at risk for malnutrition given significant wt loss, ETOH abuse, and poor oral intake, however, unable to identify malnutrition at this time.   Pt understanding of NPO diet order and is anxious for possible EGD today. Discussed possibility for diet progression. Pt amenable to  supplements once diet is advanced.   Labs reviewed.   NUTRITION - FOCUSED PHYSICAL EXAM:    Most Recent Value  Orbital Region  No depletion  Upper Arm Region  No depletion  Thoracic and Lumbar Region  No depletion  Buccal Region  No depletion  Temple Region  No depletion  Clavicle Bone Region  No depletion  Clavicle and Acromion Bone Region  No depletion  Scapular Bone Region  No depletion  Dorsal Hand  No depletion  Patellar Region  Mild depletion  Anterior Thigh Region  No depletion  Posterior Calf Region  Mild depletion  Edema (RD Assessment)  None  Hair  Reviewed  Eyes  Reviewed  Mouth  Reviewed  Skin  Reviewed  Nails  Reviewed       Diet Order:  Diet NPO time specified  EDUCATION NEEDS:   Education needs have been addressed  Skin:  Skin Assessment: Reviewed RN Assessment  Last BM:  09/02/17  Height:   Ht Readings from Last 1 Encounters:  09/01/17 5' (1.524 m)    Weight:   Wt Readings from Last 1 Encounters:  09/01/17 128 lb 8.5 oz (58.3 kg)    Ideal Body Weight:  45.5 kg  BMI:  Body mass index is 25.1 kg/m.  Estimated Nutritional Needs:   Kcal:  1700-1900  Protein:  85-100 grams  Fluid:  1.7-1.9 L    Darnell Jeschke A. Jimmye Norman, RD, LDN, CDE Pager: [phone] After hours Pager: [phone]

## 2017-09-03 NOTE — Op Note (Signed)
[facility] Patient Name: Briana Sweeney Procedure Date : 09/03/2017 MRN: [id number] Attending MD: Estill Cotta. Loletha Carrow , MD Date of Birth: 1971-04-21 CSN: [id number] Age: 46 Admit Type: Inpatient Procedure:                Upper GI endoscopy Indications:              Hematemesis, cirrhosis Providers:                Mallie Mussel L. Loletha Carrow, MD, Angus Seller, Elspeth Cho                            Tech., Technician, Joyce Copa, CRNA Referring MD:              Medicines:                Monitored Anesthesia Care Complications:            No immediate complications. Estimated Blood Loss:     Estimated blood loss: none. Procedure:                Pre-Anesthesia Assessment:                           - Prior to the procedure, a History and Physical                            was performed, and patient medications and                            allergies were reviewed. The patient's tolerance of                            previous anesthesia was also reviewed. The risks                            and benefits of the procedure and the sedation                            options and risks were discussed with the patient.                            All questions were answered, and informed consent                            was obtained. Prior Anticoagulants: The patient has                            taken no previous anticoagulant or antiplatelet                            agents. ASA Grade Assessment: III - A patient with                            severe systemic disease. After reviewing the risks  and benefits, the patient was deemed in                            satisfactory condition to undergo the procedure.                           After obtaining informed consent, the endoscope was                            passed under direct vision. Throughout the                            procedure, the patient's blood pressure, pulse, and    oxygen saturations were monitored continuously. The                            EG-[id number] ([id number]) scope was introduced through the                            mouth, and advanced to the second part of duodenum.                            The upper GI endoscopy was accomplished without                            difficulty. The patient tolerated the procedure                            well. Scope In: Scope Out: Findings:      Grade II varices were found in the lower third of the esophagus. No       active bleeding or stigmata of bleeding.      Moderate portal hypertensive gastropathy was found in the entire       examined stomach.      The cardia and gastric fundus were normal on retroflexion. No gastric       varices seen.      The examined duodenum was normal. Impression:               - Grade II esophageal varices.                           - Portal hypertensive gastropathy.                           - Normal examined duodenum.                           - No specimens collected.                           No source seen for reported hematemesis, which was                            probably from vomiting. Moderate Sedation:      MAC sedation used Recommendation:           -  Return patient to hospital ward for possible                            discharge same day.                           - Nadolol 20 mg once daily if patient's blood                            pressure and pulse will tolerate.                           - Resume regular diet.                           - Continue present medications. Procedure Code(s):        --- Professional ---                           [phone], Esophagogastroduodenoscopy, flexible,                            transoral; diagnostic, including collection of                            specimen(s) by brushing or washing, when performed                            (separate procedure) Diagnosis Code(s):        --- Professional ---                            I85.00, Esophageal varices without bleeding                           K76.6, Portal hypertension                           K31.89, Other diseases of stomach and duodenum                           K92.0, Hematemesis CPT copyright 2016 American Medical Association. All rights reserved. The codes documented in this report are preliminary and upon coder review may  be revised to meet current compliance requirements. Henry L. Loletha Carrow, MD 09/03/2017 2:23:05 PM This report has been signed electronically. Number of Addenda: 0

## 2017-09-03 NOTE — Care Management Note (Signed)
Case Management Note  Patient Details  Name: Briana Sweeney MRN: [id number] Date of Birth: Sep 30, 1970  Subjective/Objective:                    Action/Plan:  Spoke to patient at bedside. She has follow up appointment at [place] September 11, 2017 at 1 pm. Patient has contact information. Patient just recently received [place] letter( medication assistance) in November , therefore not eligible for another [facility].   Patient voiced understanding to all of above. Expected Discharge Date:                  Expected Discharge Plan:  Home/Self Care  In-House Referral:     Discharge planning Services  CM Consult, Medication Assistance, [facility]  Post Acute Care Choice:  NA Choice offered to:  Patient  DME Arranged:  N/A DME Agency:  NA  HH Arranged:  NA HH Agency:  NA  Status of Service:  Completed, signed off  If discussed at [place] of Stay Meetings, dates discussed:    Additional Comments:  Marilu Favre, RN 09/03/2017, 10:54 AM

## 2017-09-03 NOTE — Clinical Social Work Note (Signed)
Clinical Social Work Assessment  Patient Details  Name: Isela Stantz MRN: [id number] Date of Birth: [birth year]  Date of referral:  09/03/17               Reason for consult:  Substance Use/ETOH Abuse                Permission sought to share information with:    Permission granted to share information::  No  Name::     Did not state anyone that CSW could call   Housing/Transportation Living arrangements for the past 2 months:  Single Family Home Source of Information:  Patient Patient Interpreter Needed:  None Criminal Activity/Legal Involvement Pertinent to Current Situation/Hospitalization:  No - Comment as needed Significant Relationships:  Friend Lives with:  Other (Comment) Do you feel safe going back to the place where you live?  No Need for family participation in patient care:  Yes (Comment)(decision making)  Care giving concerns:  Pt states she lives with friends and does not have any close relationships with family or friends. Pt states she has trouble accessing medications and doesn't drive so she will often not refill or take medications. Pt is currently still using substances, admitting to mixing medications and alcohol.    Social Worker assessment / plan:  CSW met with pt at bedside. Pt appears disheveled and pt states that "I feel awful." Pt states that she lives with friends and does not claim to have any further supports, when asked about family or friends pt states multiple times that "there's nobody." Pt states that she wants to go straight to rehab from hospital. CSW provided pt with packet of substance use resources, highlighting inpatient options. Pt states that she will call but feels overwhelmed at the moment. CSW asked if pt had any other questions, and would consult RN Case Manager regarding medication access. Pt did not have any further questions for CSW.   Employment status:  Disabled (Comment on whether or not currently receiving Disability)(says her disability  checks are "comingPrincipal Financial information:  Self Pay (Medicaid Pending) PT Recommendations:  Not assessed at this time Information / Referral to community resources:  Outpatient Substance Abuse Treatment Options, Residential Substance Abuse Treatment Options  Patient/Family's Response to care:  Pt responds that she is interested in substance use resources.   Patient/Family's Understanding of and Emotional Response to Diagnosis, Current Treatment, and Prognosis:  Pt understands her current diagnosis, treatment, and prognosis. Pt is frustrated that she cannot just discharge to inpatient ETOH counseling but states that she will call one when she leaves.   Emotional Assessment Appearance:  Appears stated age, Disheveled Attitude/Demeanor/Rapport:  Sedated Affect (typically observed):  Depressed, Flat Orientation:  Oriented to Self, Oriented to Place, Oriented to  Time, Oriented to Situation Alcohol / Substance use:  Alcohol Use(drinks 1/2 gallon vodka x day) Psych involvement (Current and /or in the community):  No (Comment)  Discharge Needs  Concerns to be addressed:  Financial / Insurance Concerns, Basic Needs, Substance Abuse Concerns Readmission within the last 30 days:  No Current discharge risk:  Substance Abuse Barriers to Discharge:  Inadequate or no insurance, Continued Medical Work up, Active Substance Use   Alexander Mt, [place] 09/03/2017, 10:32 AM

## 2017-09-03 NOTE — Anesthesia Preprocedure Evaluation (Signed)
Anesthesia Evaluation  Patient identified by MRN, date of birth, ID band Patient awake    Reviewed: Allergy & Precautions, NPO status , Patient's Chart, lab work & pertinent test results  Airway Mallampati: II  TM Distance: >3 FB Neck ROM: Full    Dental no notable dental hx.    Pulmonary neg pulmonary ROS,    Pulmonary exam normal breath sounds clear to auscultation       Cardiovascular negative cardio ROS Normal cardiovascular exam Rhythm:Regular Rate:Normal     Neuro/Psych Seizures -, Poorly Controlled,  Anxiety Bipolar Disorder    GI/Hepatic GERD  ,(+) Cirrhosis       , Hepatitis -  Endo/Other  negative endocrine ROS  Renal/GU negative Renal ROS  negative genitourinary   Musculoskeletal negative musculoskeletal ROS (+)   Abdominal   Peds negative pediatric ROS (+)  Hematology negative hematology ROS (+)   Anesthesia Other Findings   Reproductive/Obstetrics negative OB ROS                             Anesthesia Physical Anesthesia Plan  ASA: III  Anesthesia Plan: MAC   Post-op Pain Management:    Induction: Intravenous  PONV Risk Score and Plan:   Airway Management Planned: Simple Face Mask  Additional Equipment:   Intra-op Plan:   Post-operative Plan:   Informed Consent: I have reviewed the patients History and Physical, chart, labs and discussed the procedure including the risks, benefits and alternatives for the proposed anesthesia with the patient or authorized representative who has indicated his/her understanding and acceptance.   Dental advisory given  Plan Discussed with: CRNA and Surgeon  Anesthesia Plan Comments:         Anesthesia Quick Evaluation

## 2017-09-03 NOTE — Progress Notes (Signed)
Patient complained of feeling dizzy and nauseated after she ate 25% of her dinner, MD made aware and cancelled her discharge order.

## 2017-09-03 NOTE — Transfer of Care (Signed)
Immediate Anesthesia Transfer of Care Note  Patient: Briana Sweeney  Procedure(s) Performed: ESOPHAGOGASTRODUODENOSCOPY (EGD) (N/A )  Patient Location: Endoscopy Unit  Anesthesia Type:General  Level of Consciousness: awake, alert  and oriented  Airway & Oxygen Therapy: Patient Spontanous Breathing and Patient connected to nasal cannula oxygen  Post-op Assessment: Report given to RN and Post -op Vital signs reviewed and stable  Post vital signs: Reviewed and stable  Last Vitals:  Vitals:   09/03/17 0535 09/03/17 1240  BP: 117/84 106/75  Pulse: 97 95  Resp: 17 18  Temp: 36.9 C 36.8 C  SpO2: 97% 97%    Last Pain:  Vitals:   09/03/17 1240  TempSrc: Oral  PainSc:          Complications: No apparent anesthesia complications

## 2017-09-03 NOTE — Anesthesia Postprocedure Evaluation (Signed)
Anesthesia Post Note  Patient: Briana Sweeney  Procedure(s) Performed: ESOPHAGOGASTRODUODENOSCOPY (EGD) (N/A )     Patient location during evaluation: PACU Anesthesia Type: MAC Level of consciousness: awake and alert Pain management: pain level controlled Vital Signs Assessment: post-procedure vital signs reviewed and stable Respiratory status: spontaneous breathing, nonlabored ventilation, respiratory function stable and patient connected to nasal cannula oxygen Cardiovascular status: stable and blood pressure returned to baseline Postop Assessment: no apparent nausea or vomiting Anesthetic complications: no    Last Vitals:  Vitals:   09/03/17 1240 09/03/17 1411  BP: 106/75 (!) 87/57  Pulse: 95   Resp: 18 18  Temp: 36.8 C 36.7 C  SpO2: 97% 100%    Last Pain:  Vitals:   09/03/17 1411  TempSrc: Oral  PainSc:                  Calem Cocozza S

## 2017-09-04 DIAGNOSIS — I8500 Esophageal varices without bleeding: Secondary | ICD-10-CM | POA: Diagnosis not present

## 2017-09-04 LAB — BASIC METABOLIC PANEL
Anion gap: 6 (ref 5–15)
BUN: <5 mg/dL — ABNORMAL LOW (ref 6–20)
CO2: 19 mmol/L — ABNORMAL LOW (ref 22–32)
Calcium: 8.1 mg/dL — ABNORMAL LOW (ref 8.9–10.3)
Chloride: 111 mmol/L (ref 101–111)
Creatinine, Ser: 0.57 mg/dL (ref 0.44–1.00)
GFR calc Af Amer: >60 mL/min (ref >60)
GFR calc non Af Amer: >60 mL/min (ref >60)
Glucose, Bld: 102 mg/dL — ABNORMAL HIGH (ref 65–99)
Potassium: 3.2 mmol/L — ABNORMAL LOW (ref 3.5–5.1)
Sodium: 136 mmol/L (ref 135–145)

## 2017-09-04 LAB — CBC
HCT: 29.9 % — ABNORMAL LOW (ref 36.0–46.0)
Hemoglobin: 9.7 g/dL — ABNORMAL LOW (ref 12.0–15.0)
MCH: 31.1 pg (ref 26.0–34.0)
MCHC: 32.4 g/dL (ref 30.0–36.0)
MCV: 95.8 fL (ref 78.0–100.0)
Platelets: DECREASED 10*3/uL (ref 150–400)
RBC: 3.12 MIL/uL — ABNORMAL LOW (ref 3.87–5.11)
RDW: 16.3 % — ABNORMAL HIGH (ref 11.5–15.5)
WBC: 2.5 10*3/uL — ABNORMAL LOW (ref 4.0–10.5)

## 2017-09-04 MED ORDER — POTASSIUM CHLORIDE CRYS ER 20 MEQ PO TBCR
40.0000 meq | EXTENDED_RELEASE_TABLET | Freq: Once | ORAL | Status: AC
  Administered 2017-09-04: 40 meq via ORAL
  Filled 2017-09-04: qty 2

## 2017-09-04 NOTE — Progress Notes (Signed)
Patient discharged to home with instructions and prescriptions via [facility] [employer].

## 2017-09-04 NOTE — Discharge Summary (Signed)
[facility]  Physician Discharge Summary   Patient ID: Briana Sweeney MRN: [id number] DOB/AGE: December 31, 1970 46 y.o.  Admit date: 08/31/2017 Discharge date: 09/04/2017  PCP: Patient, No Pcp Per  DISCHARGE DIAGNOSES:  Active Problems:   Hematemesis   Esophageal varices without bleeding (HCC)   RECOMMENDATIONS FOR OUTPATIENT FOLLOW UP: 1. Patient instructed to keep her appointment with the community health clinic next week  DISCHARGE CONDITION: fair  Diet recommendation: As before  [facility]   08/31/17 1912 09/01/17 0200 09/03/17 1240  Weight: 60.3 kg (133 lb) 58.3 kg (128 lb 8.5 oz) 58.3 kg (128 lb 8.4 oz)    INITIAL HISTORY: 46 year old Caucasian female with a past medical history of alcohol abuse, liver cirrhosis who was recently hospitalized in November for alcoholic hepatitis and ascites.  She underwent paracentesis during the hospitalization.  She was seen by gastroenterology.  She was discharged on diuretics.  She presented with complains of seizure type activity at home along with hematemesis.  She was noted to be withdrawing from alcohol.  She was hospitalized for further management.  Consultations:  GI  Procedures: EGD Impression:                - Grade II esophageal varices.  - Portal hypertensive gastropathy.   - Normal examined duodenum.   - No specimens collected.  No source seen for reported hematemesis, which was probably from vomiting.   HOSPITAL COURSE:    Hematemesis Patient's hemoglobin did drop slightly but remains stable.  Patient was initially started on PPI and octreotide infusion.  Also given ceftriaxone.  Gastroenterology was consulted.  Ceftriaxone and octreotide were discontinued.  Patient underwent endoscopy which did show esophageal varices however no active bleeding was noted.    Alcoholic liver disease with ascites Hospitalized in November for alcoholic hepatitis.  She underwent paracentesis.  SBP was ruled out.  She was  discharged home on diuretics.  She mentions that she has been compliant with same.  She was also discharged on pentoxifylline.  This is being continued.  Hyperbilirubinemia is noted but is better compared to what it was last month.  Magnesium was 1.7.  She was given a dose of magnesium sulfate.  May continue home medications.  Alcohol cessation was emphasized.  Alcohol abuse with alcohol withdrawal/possible withdrawal seizures No seizure activity noted in the hospital.  Patient was tachycardic.  She was initiated on the alcohol withdrawal protocol.  Withdrawal symptoms have significantly improved.  She is tremulous.  Will be discharged on a few doses of Librium. Continue thiamine, multivitamins and folic acid.   History of positive HCV antibody Management as outpatient.  History of seizure disorder She is noted to be on Dilantin and Topamax at home which is being continued.  Dilantin level was therapeutic.  History of anxiety disorder Continue BuSpar.  Possible acute blood loss anemia Stable.  Not experiencing significant blood loss.   Leukopenia and thrombocytopenia Most likely due to alcohol abuse and liver disease.   Potassium will be repleted prior to discharge.  Overall stable for discharge today.     PERTINENT LABS:  The results of significant diagnostics from this hospitalization (including imaging, microbiology, ancillary and laboratory) are listed below for reference.     Labs: Basic Metabolic Panel: Recent Labs  Lab 08/31/17 2001 09/01/17 0923 09/02/17 0509 09/02/17 0920 09/03/17 0259 09/04/17 0550  NA 142 138 135  --  137 136  K 3.7 3.5 4.1  --  3.5 3.2*  CL 106 106 103  --  108 111  CO2 19* 18* 20*  --  23 19*  GLUCOSE 88 46* 138*  --  130* 102*  BUN 7 5* 5*  --  <5* <5*  CREATININE 0.59 0.58 0.76  --  0.71 0.57  CALCIUM 8.7* 8.1* 8.1*  --  8.0* 8.1*  MG  --   --   --  1.7  --   --    Liver Function Tests: Recent Labs  Lab 08/31/17 2001  09/01/17 0623 09/02/17 0509 09/03/17 0259  AST 78* 64* 60* 57*  ALT 19 17 17 17   ALKPHOS 160* 113 105 125  BILITOT 2.8* 2.3* 3.2* 2.5*  PROT 9.2* 7.8 7.7 8.0  ALBUMIN 3.1* 2.5* 2.4* 2.5*   Recent Labs  Lab 08/31/17 2000  LIPASE 72*   CBC: Recent Labs  Lab 09/01/17 0623 09/01/17 1411 09/02/17 0509 09/03/17 0259 09/04/17 0550  WBC 2.0* 2.3* 2.1* 2.2* 2.5*  HGB 10.3* 10.7* 10.3* 10.2* 9.7*  HCT 31.5* 32.9* 31.6* 31.6* 29.9*  MCV 96.9 95.9 96.9 96.3 95.8  PLT PLATELET CLUMPING, SUGGEST RECOLLECTION OF SAMPLE IN CITRATE TUBE. 21* PLATELET CLUMPING, SUGGEST RECOLLECTION OF SAMPLE IN CITRATE TUBE. PLATELET CLUMPING, SUGGEST RECOLLECTION OF SAMPLE IN CITRATE TUBE. PLATELET CLUMPS NOTED ON SMEAR, COUNT APPEARS DECREASED    CBG: Recent Labs  Lab 08/31/17 1919  GLUCAP 81     DISCHARGE EXAMINATION: Vitals:   09/03/17 1440 09/03/17 1839 09/03/17 2149 09/04/17 0508  BP: 111/78 98/69 105/72 108/73  Pulse: 98 98 (!) 110 98  Resp: 17 14 18 18   Temp:  98.4 F (36.9 C) 98.5 F (36.9 C) 98.6 F (37 C)  TempSrc:  Oral Oral Oral  SpO2: 95% 98% 98% 98%  Weight:      Height:       General appearance: alert, cooperative, appears stated age and no distress Resp: clear to auscultation bilaterally Cardio: regular rate and rhythm, S1, S2 normal, no murmur, click, rub or gallop GI: soft, non-tender; bowel sounds normal; no masses,  no organomegaly  DISPOSITION: Home  Discharge Instructions    Call MD for:  extreme fatigue   Complete by:  As directed    Call MD for:  persistant dizziness or light-headedness   Complete by:  As directed    Call MD for:  persistant nausea and vomiting   Complete by:  As directed    Call MD for:  severe uncontrolled pain   Complete by:  As directed    Call MD for:  temperature >100.4   Complete by:  As directed    Diet - low sodium heart healthy   Complete by:  As directed    Discharge instructions   Complete by:  As directed    Please  refrain from alcohol use. Take your medications as prescribed.  You were cared for by a hospitalist during your hospital stay. If you have any questions about your discharge medications or the care you received while you were in the hospital after you are discharged, you can call the unit and asked to speak with the hospitalist on call if the hospitalist that took care of you is not available. Once you are discharged, your primary care physician will handle any further medical issues. Please note that NO REFILLS for any discharge medications will be authorized once you are discharged, as it is imperative that you return to your primary care physician (or establish a relationship with a primary care physician if you do not have one) for  your aftercare needs so that they can reassess your need for medications and monitor your lab values. If you do not have a primary care physician, you can call [phone] for a physician referral.   Increase activity slowly   Complete by:  As directed         Allergies as of 09/04/2017   No Known Allergies     Medication List    STOP taking these medications   ibuprofen 200 MG tablet Commonly known as:  ADVIL,MOTRIN   pantoprazole 40 MG tablet Commonly known as:  PROTONIX     TAKE these medications   busPIRone 15 MG tablet Commonly known as:  BUSPAR Take 15 mg 3 (three) times daily by mouth.   chlordiazePOXIDE 25 MG capsule Commonly known as:  LIBRIUM Take 1 cap 2 x a day for 3 days then once a day for 3 days then stop.   folic acid 1 MG tablet Commonly known as:  FOLVITE Take 1 tablet (1 mg total) by mouth daily.   furosemide 20 MG tablet Commonly known as:  LASIX Take 1 tablet (20 mg total) by mouth daily.   multivitamin with minerals Tabs tablet Take 1 tablet by mouth daily.   pentoxifylline 400 MG CR tablet Commonly known as:  TRENTAL Take 1 tablet (400 mg total) by mouth 3 (three) times daily with meals.   phenytoin 100 MG ER  capsule Commonly known as:  DILANTIN Take 3 capsules (300 mg total) by mouth at bedtime.   spironolactone 50 MG tablet Commonly known as:  ALDACTONE Take 1 tablet (50 mg total) by mouth daily.   thiamine 100 MG tablet Take 1 tablet (100 mg total) by mouth daily.   topiramate 25 MG tablet Commonly known as:  TOPAMAX Take 1 tablet (25 mg total) by mouth 2 (two) times daily.        Follow-up Information    [facility] Follow up.   Why:  Hospital follow up appointment September 11, 2017 at 1 pm  Contact information: [place] [phone]          TOTAL DISCHARGE TIME: 35 mins  Bonnielee Haff  [facility] Pager [phone]  09/04/2017, 2:54 PM

## 2017-09-04 NOTE — Social Work (Signed)
CSW consulted to provide transportation support, since pt did not discharge yesterday evening.   Pt states that she does not have any money or connections to pick her up from the hospital. Pt is discharging back to address in [place]: [address], [place], [place] [address]. Pt has key to enter home.   CSW able to provide pt RN with updated voucher for [place] signing off. Please consult if any additional needs arise.  [place], [place] Work [phone]

## 2017-09-11 ENCOUNTER — Ambulatory Visit: Payer: Self-pay | Admitting: Family Medicine

## 2017-09-11 ENCOUNTER — Inpatient Hospital Stay (HOSPITAL_COMMUNITY)
Admission: EM | Admit: 2017-09-11 | Discharge: 2017-09-16 | DRG: 897 | Disposition: A | Discharge status: Home / Self Care | Payer: Self-pay | Attending: Internal Medicine | Admitting: Internal Medicine

## 2017-09-11 ENCOUNTER — Other Ambulatory Visit: Payer: Self-pay

## 2017-09-11 ENCOUNTER — Encounter (HOSPITAL_COMMUNITY): Payer: Self-pay | Admitting: *Deleted

## 2017-09-11 DIAGNOSIS — F319 Bipolar disorder, unspecified: Secondary | ICD-10-CM | POA: Diagnosis present

## 2017-09-11 DIAGNOSIS — Z915 Personal history of self-harm: Secondary | ICD-10-CM

## 2017-09-11 DIAGNOSIS — Y902 Blood alcohol level of 40-59 mg/100 ml: Secondary | ICD-10-CM | POA: Diagnosis present

## 2017-09-11 DIAGNOSIS — Z87442 Personal history of urinary calculi: Secondary | ICD-10-CM

## 2017-09-11 DIAGNOSIS — Z79899 Other long term (current) drug therapy: Secondary | ICD-10-CM

## 2017-09-11 DIAGNOSIS — F209 Schizophrenia, unspecified: Secondary | ICD-10-CM | POA: Diagnosis present

## 2017-09-11 DIAGNOSIS — K920 Hematemesis: Secondary | ICD-10-CM | POA: Diagnosis present

## 2017-09-11 DIAGNOSIS — B379 Candidiasis, unspecified: Secondary | ICD-10-CM | POA: Diagnosis present

## 2017-09-11 DIAGNOSIS — R140 Abdominal distension (gaseous): Secondary | ICD-10-CM | POA: Diagnosis present

## 2017-09-11 DIAGNOSIS — K219 Gastro-esophageal reflux disease without esophagitis: Secondary | ICD-10-CM | POA: Diagnosis present

## 2017-09-11 DIAGNOSIS — F10231 Alcohol dependence with withdrawal delirium: Secondary | ICD-10-CM

## 2017-09-11 DIAGNOSIS — K7031 Alcoholic cirrhosis of liver with ascites: Secondary | ICD-10-CM | POA: Diagnosis present

## 2017-09-11 DIAGNOSIS — F1024 Alcohol dependence with alcohol-induced mood disorder: Secondary | ICD-10-CM | POA: Diagnosis present

## 2017-09-11 DIAGNOSIS — G43909 Migraine, unspecified, not intractable, without status migrainosus: Secondary | ICD-10-CM | POA: Diagnosis present

## 2017-09-11 DIAGNOSIS — N760 Acute vaginitis: Secondary | ICD-10-CM | POA: Diagnosis present

## 2017-09-11 DIAGNOSIS — R768 Other specified abnormal immunological findings in serum: Secondary | ICD-10-CM | POA: Diagnosis present

## 2017-09-11 DIAGNOSIS — D696 Thrombocytopenia, unspecified: Secondary | ICD-10-CM | POA: Diagnosis present

## 2017-09-11 DIAGNOSIS — F1014 Alcohol abuse with alcohol-induced mood disorder: Secondary | ICD-10-CM

## 2017-09-11 DIAGNOSIS — B182 Chronic viral hepatitis C: Secondary | ICD-10-CM | POA: Diagnosis present

## 2017-09-11 DIAGNOSIS — F10939 Alcohol use, unspecified with withdrawal, unspecified: Secondary | ICD-10-CM

## 2017-09-11 DIAGNOSIS — F10239 Alcohol dependence with withdrawal, unspecified: Principal | ICD-10-CM | POA: Diagnosis present

## 2017-09-11 DIAGNOSIS — F419 Anxiety disorder, unspecified: Secondary | ICD-10-CM | POA: Diagnosis present

## 2017-09-11 HISTORY — DX: Alcohol use, unspecified with withdrawal, unspecified: F10.939

## 2017-09-11 LAB — HEPATIC FUNCTION PANEL
ALT: 25 U/L (ref 14–54)
AST: 95 U/L — ABNORMAL HIGH (ref 15–41)
Albumin: 3 g/dL — ABNORMAL LOW (ref 3.5–5.0)
Alkaline Phosphatase: 123 U/L (ref 38–126)
Bilirubin, Direct: 0.8 mg/dL — ABNORMAL HIGH (ref 0.1–0.5)
Indirect Bilirubin: 1.1 mg/dL — ABNORMAL HIGH (ref 0.3–0.9)
Total Bilirubin: 1.9 mg/dL — ABNORMAL HIGH (ref 0.3–1.2)
Total Protein: 8.6 g/dL — ABNORMAL HIGH (ref 6.5–8.1)

## 2017-09-11 LAB — BASIC METABOLIC PANEL
Anion gap: 10 (ref 5–15)
BUN: <5 mg/dL — ABNORMAL LOW (ref 6–20)
CO2: 22 mmol/L (ref 22–32)
Calcium: 8.7 mg/dL — ABNORMAL LOW (ref 8.9–10.3)
Chloride: 104 mmol/L (ref 101–111)
Creatinine, Ser: 0.52 mg/dL (ref 0.44–1.00)
GFR calc Af Amer: >60 mL/min (ref >60)
GFR calc non Af Amer: >60 mL/min (ref >60)
Glucose, Bld: 89 mg/dL (ref 65–99)
Potassium: 3.8 mmol/L (ref 3.5–5.1)
Sodium: 136 mmol/L (ref 135–145)

## 2017-09-11 LAB — PLATELET COUNT: Platelets: 21 10*3/uL — CL (ref 150–400)

## 2017-09-11 LAB — RAPID URINE DRUG SCREEN, HOSP PERFORMED
Amphetamines: NOT DETECTED
Barbiturates: POSITIVE — AB
Benzodiazepines: NOT DETECTED
Cocaine: NOT DETECTED
Opiates: NOT DETECTED
Tetrahydrocannabinol: NOT DETECTED

## 2017-09-11 LAB — URINALYSIS, ROUTINE W REFLEX MICROSCOPIC
Bilirubin Urine: NEGATIVE
Glucose, UA: NEGATIVE mg/dL
Hgb urine dipstick: NEGATIVE
Ketones, ur: NEGATIVE mg/dL
Leukocytes, UA: NEGATIVE
Nitrite: NEGATIVE
Protein, ur: NEGATIVE mg/dL
Specific Gravity, Urine: 1.01 (ref 1.005–1.030)
pH: 9 — ABNORMAL HIGH (ref 5.0–8.0)

## 2017-09-11 LAB — CBC
HCT: 33.4 % — ABNORMAL LOW (ref 36.0–46.0)
Hemoglobin: 11.1 g/dL — ABNORMAL LOW (ref 12.0–15.0)
MCH: 31.9 pg (ref 26.0–34.0)
MCHC: 33.2 g/dL (ref 30.0–36.0)
MCV: 96 fL (ref 78.0–100.0)
Platelets: DECREASED 10*3/uL (ref 150–400)
RBC: 3.48 MIL/uL — ABNORMAL LOW (ref 3.87–5.11)
RDW: 16.7 % — ABNORMAL HIGH (ref 11.5–15.5)
WBC: 2.5 10*3/uL — ABNORMAL LOW (ref 4.0–10.5)

## 2017-09-11 LAB — I-STAT BETA HCG BLOOD, ED (MC, WL, AP ONLY): I-stat hCG, quantitative: <5 m[IU]/mL (ref <5)

## 2017-09-11 LAB — PROTIME-INR
INR: 1.19
Prothrombin Time: 15 seconds (ref 11.4–15.2)

## 2017-09-11 LAB — PHENYTOIN LEVEL, TOTAL: Phenytoin Lvl: <2.5 ug/mL — ABNORMAL LOW (ref 10.0–20.0)

## 2017-09-11 LAB — MAGNESIUM: Magnesium: 1.6 mg/dL — ABNORMAL LOW (ref 1.7–2.4)

## 2017-09-11 LAB — AMMONIA: Ammonia: 63 umol/L — ABNORMAL HIGH (ref 9–35)

## 2017-09-11 LAB — CBG MONITORING, ED: Glucose-Capillary: 75 mg/dL (ref 65–99)

## 2017-09-11 LAB — LIPASE, BLOOD: Lipase: 93 U/L — ABNORMAL HIGH (ref 11–51)

## 2017-09-11 LAB — ETHANOL: Alcohol, Ethyl (B): 42 mg/dL — ABNORMAL HIGH (ref <10)

## 2017-09-11 MED ORDER — LORAZEPAM 1 MG PO TABS
1.0000 mg | ORAL_TABLET | Freq: Four times a day (QID) | ORAL | Status: AC | PRN
  Administered 2017-09-13: 1 mg via ORAL
  Filled 2017-09-11: qty 1

## 2017-09-11 MED ORDER — BOOST / RESOURCE BREEZE PO LIQD CUSTOM
1.0000 | Freq: Three times a day (TID) | ORAL | Status: DC
  Administered 2017-09-12 – 2017-09-16 (×10): 1 via ORAL

## 2017-09-11 MED ORDER — DOCUSATE SODIUM 100 MG PO CAPS
100.0000 mg | ORAL_CAPSULE | Freq: Two times a day (BID) | ORAL | Status: DC
  Administered 2017-09-11 – 2017-09-16 (×10): 100 mg via ORAL
  Filled 2017-09-11 (×10): qty 1

## 2017-09-11 MED ORDER — LORAZEPAM 2 MG/ML IJ SOLN
1.0000 mg | Freq: Four times a day (QID) | INTRAMUSCULAR | Status: AC | PRN
  Administered 2017-09-11: 1 mg via INTRAVENOUS
  Filled 2017-09-11 (×2): qty 1

## 2017-09-11 MED ORDER — PHENYTOIN SODIUM EXTENDED 100 MG PO CAPS
300.0000 mg | ORAL_CAPSULE | Freq: Every day | ORAL | Status: DC
Start: 2017-09-11 — End: 2017-09-16
  Administered 2017-09-11 – 2017-09-15 (×5): 300 mg via ORAL
  Filled 2017-09-11 (×5): qty 3

## 2017-09-11 MED ORDER — POTASSIUM CHLORIDE 10 MEQ/100ML IV SOLN
10.0000 meq | Freq: Once | INTRAVENOUS | Status: AC
  Administered 2017-09-11: 10 meq via INTRAVENOUS
  Filled 2017-09-11: qty 100

## 2017-09-11 MED ORDER — LACTATED RINGERS IV SOLN
INTRAVENOUS | Status: DC
  Administered 2017-09-11 – 2017-09-12 (×2): via INTRAVENOUS

## 2017-09-11 MED ORDER — SPIRONOLACTONE 25 MG PO TABS
50.0000 mg | ORAL_TABLET | Freq: Every day | ORAL | Status: DC
  Administered 2017-09-12 – 2017-09-16 (×5): 50 mg via ORAL
  Filled 2017-09-11 (×5): qty 2

## 2017-09-11 MED ORDER — FOLIC ACID 1 MG PO TABS
1.0000 mg | ORAL_TABLET | Freq: Every day | ORAL | Status: DC
  Administered 2017-09-12 – 2017-09-16 (×5): 1 mg via ORAL
  Filled 2017-09-11 (×5): qty 1

## 2017-09-11 MED ORDER — PANTOPRAZOLE SODIUM 40 MG IV SOLR
40.0000 mg | INTRAVENOUS | Status: DC
  Administered 2017-09-12 – 2017-09-13 (×2): 40 mg via INTRAVENOUS
  Filled 2017-09-11 (×2): qty 40

## 2017-09-11 MED ORDER — LORAZEPAM 2 MG/ML IJ SOLN
0.0000 mg | Freq: Two times a day (BID) | INTRAMUSCULAR | Status: AC
  Administered 2017-09-14 – 2017-09-15 (×4): 2 mg via INTRAVENOUS
  Filled 2017-09-11 (×4): qty 1

## 2017-09-11 MED ORDER — VITAMIN B-1 100 MG PO TABS
100.0000 mg | ORAL_TABLET | Freq: Every day | ORAL | Status: DC
  Administered 2017-09-12 – 2017-09-16 (×5): 100 mg via ORAL
  Filled 2017-09-11 (×5): qty 1

## 2017-09-11 MED ORDER — FOLIC ACID 1 MG PO TABS
1.0000 mg | ORAL_TABLET | Freq: Once | ORAL | Status: AC
Start: 2017-09-11 — End: 2017-09-11
  Administered 2017-09-11: 1 mg via ORAL
  Filled 2017-09-11: qty 1

## 2017-09-11 MED ORDER — ONDANSETRON HCL 4 MG PO TABS
4.0000 mg | ORAL_TABLET | Freq: Four times a day (QID) | ORAL | Status: DC | PRN
  Administered 2017-09-13: 4 mg via ORAL
  Filled 2017-09-11: qty 1

## 2017-09-11 MED ORDER — ONDANSETRON HCL 4 MG/2ML IJ SOLN
4.0000 mg | Freq: Four times a day (QID) | INTRAMUSCULAR | Status: DC | PRN
  Administered 2017-09-16: 4 mg via INTRAVENOUS
  Filled 2017-09-11: qty 2

## 2017-09-11 MED ORDER — PENTOXIFYLLINE ER 400 MG PO TBCR
400.0000 mg | EXTENDED_RELEASE_TABLET | Freq: Three times a day (TID) | ORAL | Status: DC
  Administered 2017-09-12 – 2017-09-16 (×13): 400 mg via ORAL
  Filled 2017-09-11 (×20): qty 1

## 2017-09-11 MED ORDER — LORAZEPAM 1 MG PO TABS
2.0000 mg | ORAL_TABLET | Freq: Once | ORAL | Status: AC
Start: 2017-09-11 — End: 2017-09-11
  Administered 2017-09-11: 2 mg via ORAL
  Filled 2017-09-11: qty 2

## 2017-09-11 MED ORDER — THIAMINE HCL 100 MG/ML IJ SOLN
100.0000 mg | Freq: Once | INTRAMUSCULAR | Status: AC
  Administered 2017-09-11: 100 mg via INTRAVENOUS
  Filled 2017-09-11: qty 2

## 2017-09-11 MED ORDER — ADULT MULTIVITAMIN W/MINERALS CH
1.0000 | ORAL_TABLET | Freq: Every day | ORAL | Status: DC
  Administered 2017-09-12 – 2017-09-16 (×5): 1 via ORAL
  Filled 2017-09-11 (×5): qty 1

## 2017-09-11 MED ORDER — ENSURE ENLIVE PO LIQD
237.0000 mL | Freq: Two times a day (BID) | ORAL | Status: DC
  Administered 2017-09-12 (×2): 237 mL via ORAL

## 2017-09-11 MED ORDER — ACETAMINOPHEN 650 MG RE SUPP: 650.0000 mg | Freq: Four times a day (QID) | RECTAL | Status: DC | PRN

## 2017-09-11 MED ORDER — LORAZEPAM 2 MG/ML IJ SOLN
0.0000 mg | Freq: Four times a day (QID) | INTRAMUSCULAR | Status: AC
Start: 2017-09-12 — End: 2017-09-13
  Administered 2017-09-11 – 2017-09-12 (×4): 2 mg via INTRAVENOUS
  Administered 2017-09-13: 4 mg via INTRAVENOUS
  Administered 2017-09-13: 2 mg via INTRAVENOUS
  Administered 2017-09-13: 4 mg via INTRAVENOUS
  Administered 2017-09-13: 2 mg via INTRAVENOUS
  Filled 2017-09-11 (×2): qty 1
  Filled 2017-09-11: qty 2
  Filled 2017-09-11 (×4): qty 1
  Filled 2017-09-11: qty 2

## 2017-09-11 MED ORDER — SODIUM CHLORIDE 0.9 % IV SOLN
80.0000 mg | Freq: Once | INTRAVENOUS | Status: AC
  Administered 2017-09-11: 80 mg via INTRAVENOUS
  Filled 2017-09-11: qty 80

## 2017-09-11 MED ORDER — THIAMINE HCL 100 MG/ML IJ SOLN: 100.0000 mg | Freq: Every day | INTRAMUSCULAR | Status: DC

## 2017-09-11 MED ORDER — ACETAMINOPHEN 325 MG PO TABS
650.0000 mg | ORAL_TABLET | Freq: Four times a day (QID) | ORAL | Status: DC | PRN
  Administered 2017-09-11 – 2017-09-15 (×5): 650 mg via ORAL
  Filled 2017-09-11 (×5): qty 2

## 2017-09-11 NOTE — ED Notes (Signed)
Pt ambulated to restroom to attempted to get urine sample. Steady gait noted no additional assistance needed.

## 2017-09-11 NOTE — ED Notes (Signed)
Received call from lab. Clumps found in platelet sample, platelet count is low, but not 20. Additional sample will be drawn and sent for accurate measurement

## 2017-09-11 NOTE — H&P (Signed)
History and Physical    Briana Sweeney BJY:[id number] DOB: [birth year] DOA: 09/11/2017  [facility] - has not been seen there yet Consultants:  Liver doctor - ?Danis Patient coming from: Staying with a friend; NOK: Sister, [phone]  Chief Complaint: hallucinations, emesis  HPI: Briana Sweeney is a 47 y.o. female with medical history significant of seizures; schizophrenia/bipolar; hep C; HERD; depression; and alcoholic cirrhosis presenting with emesis and ETOH withdrawal.  She was hospitalized recently (12/23-27) for hematemesis associated with esophageal varices.  She reports resumption of drinking after discharge but in the last few days she had a seizure, hallucinations, emesis, and has felt very dizzy.  She has "always had hallucinations but not every day but I haven't been taking my medication, I don't have any way to get it."  Seizure was 3 days ago but "wasn't a real bad one."  Hallucinations got worse, visual only.  Last drink was about 10am yesterday morning.  Drinks beer and vodka.  Drank only a little yesterday because her stomach was upset and she couldn''t hold it down.  She does want to quit and would like inpatient treatment, "I'm tired of being really sick."  H/o ETOH withdrawal seizures.  She has been drinking since she was 32-3 years old.  Last day without drinking was while she was previously hospitalized.    ED Course:  Alcoholic with ascites, bounceback.  ETOH withdrawal, minimal hematemesis this time.    Review of Systems: As per HPI; otherwise review of systems reviewed and negative.   Ambulatory Status:  Ambulates without assistance  Past Medical History:  Diagnosis Date  . Anxiety   . Bipolar affective disorder ([place])    With anxiety features  . Cirrhosis of liver ([place])    Due to alcohol and hepatitis C  . Depression   . GERD (gastroesophageal reflux disease)   . Hepatitis C 2018   hepatitis c and alcohol related hepatitis  . History  of blood transfusion    "blood doesn't clot; I fell down and had to have a transfusion"  . History of kidney stones   . Migraine    "when I get really stressed" (09/01/2017)  . Schizophrenia ([place])   . Seizures ([place])    "when I run out of my RX; lots recently" (09/01/2017)    Past Surgical History:  Procedure Laterality Date  . ESOPHAGOGASTRODUODENOSCOPY N/A 09/03/2017   Procedure: ESOPHAGOGASTRODUODENOSCOPY (EGD);  Surgeon: Doran Stabler, MD;  Location: [facility];  Service: Gastroenterology;  Laterality: N/A;  . FINGER FRACTURE SURGERY Left    "shattered my pinky"  . FRACTURE SURGERY    . IR PARACENTESIS  07/23/2017  . IR PARACENTESIS  07/2017   "did it twice in the same week" (09/01/2017)  . SHOULDER OPEN ROTATOR CUFF REPAIR Right   . TUBAL LIGATION    . VAGINAL HYSTERECTOMY      Social History   Socioeconomic History  . Marital status: Legally Separated    Spouse name: Not on file  . Number of children: Not on file  . Years of education: Not on file  . Highest education level: Not on file  Social Needs  . Financial resource strain: Not on file  . Food insecurity - worry: Not on file  . Food insecurity - inability: Not on file  . Transportation needs - medical: Not on file  . Transportation needs - non-medical: Not on file  Occupational History  . Occupation: applying for disability  Tobacco  Use  . Smoking status: Never Smoker  . Smokeless tobacco: Never Used  Substance and Sexual Activity  . Alcohol use: Yes    Alcohol/week: 268.8 oz    Types: 448 Shots of liquor per week    Comment: 09/01/2017 "maybe 1/2 gallon vodka/day"  . Drug use: No  . Sexual activity: Not Currently  Other Topics Concern  . Not on file  Social History Narrative   She moved with a boyfriend to [place] and was followed at [facility].  He died of a massive heart attack in 8/18, per her report, and so she moved back to [place] and is living with a friend.    No Known  Allergies  Family History  Problem Relation Age of Onset  . Lung cancer Mother 19  . Alcohol abuse Mother   . Throat cancer Father 43    Prior to Admission medications   Medication Sig Start Date End Date Taking? Authorizing Provider  chlordiazePOXIDE (LIBRIUM) 25 MG capsule Take 1 cap 2 x a day for 3 days then once a day for 3 days then stop. 09/03/17  Yes Bonnielee Haff, MD  furosemide (LASIX) 20 MG tablet Take 1 tablet (20 mg total) by mouth daily. 09/03/17  Yes Bonnielee Haff, MD  pantoprazole (PROTONIX) 40 MG tablet Take 40 mg by mouth daily. 08/06/17  Yes [provider]  pentoxifylline (TRENTAL) 400 MG CR tablet Take 1 tablet (400 mg total) by mouth 3 (three) times daily with meals. 09/03/17  Yes Bonnielee Haff, MD  phenytoin (DILANTIN) 100 MG ER capsule Take 3 capsules (300 mg total) by mouth at bedtime. 09/03/17  Yes Bonnielee Haff, MD  spironolactone (ALDACTONE) 50 MG tablet Take 1 tablet (50 mg total) by mouth daily. 09/03/17  Yes Bonnielee Haff, MD  folic acid (FOLVITE) 1 MG tablet Take 1 tablet (1 mg total) by mouth daily. Patient not taking: Reported on 09/11/2017 09/04/17   Bonnielee Haff, MD  Multiple Vitamin (MULTIVITAMIN WITH MINERALS) TABS tablet Take 1 tablet by mouth daily. Patient not taking: Reported on 09/11/2017 09/04/17   Bonnielee Haff, MD  thiamine 100 MG tablet Take 1 tablet (100 mg total) by mouth daily. Patient not taking: Reported on 09/11/2017 09/04/17   Bonnielee Haff, MD  topiramate (TOPAMAX) 25 MG tablet Take 1 tablet (25 mg total) by mouth 2 (two) times daily. Patient not taking: Reported on 09/11/2017 09/03/17   Bonnielee Haff, MD    Physical Exam: Vitals:   09/11/17 1445 09/11/17 1542 09/11/17 1545 09/11/17 1642  BP:      Pulse: (!) 107  96 (!) 101  Resp: (!) 24  16 (!) 24  Temp:      TempSrc:      SpO2: 98%  100% 96%  Weight:  59 kg (130 lb)    Height:  5' (1.524 m)       General:  Appears calm and comfortable and is  NAD Eyes:  PERRL, EOMI, normal lids, iris ENT:  grossly normal hearing, lips & tongue, mmm Neck:  no LAD, masses or thyromegaly; no carotid bruits Cardiovascular:  RRR, no m/r/g. No LE edema.  Respiratory:   CTA bilaterally with no wheezes/rales/rhonchi.  Normal respiratory effort. Abdomen:  soft, mildly TTP in LLQ and otherwise NT, ND, NABS Skin:  no rash or induration seen on limited exam Musculoskeletal:  grossly normal tone BUE/BLE, good ROM, no bony abnormality Psychiatric:  grossly normal mood and affect, speech fluent and appropriate, AOx3 Neurologic:  CN 2-12  grossly intact, moves all extremities in coordinated fashion, sensation intact    Radiological Exams on Admission: No results found.  EKG: Independently reviewed.  Sinus tachycardia with rate 109; nonspecific ST changes with significant artifact but no evidence of acute ischemia   Labs on Admission: I have personally reviewed the available labs and imaging studies at the time of the admission.  Pertinent labs:   UA essentially negative UDS positive for barbiturates Albumin 3.0; prior 2.5 Lipase 93 AST 95/ALT 25/Bili 1.9; prior 57/17/2.5 WBC 2.5 - stable Hgb 11.1 - stable Platelets clumped Dilantin <2.5 ETOH 42  Assessment/Plan Principal Problem:   Alcohol withdrawal (HCC) Active Problems:   Hepatitis C, chronic (HCC)   Alcoholic cirrhosis of liver with ascites ([place])   -Patient with chronic ETOH dependence -Currently in withdrawal and with n/v so unable to tolerate ETOH -Despite +ETOH level, she is having active withdrawal symptoms -Reports she is interested in rehab -She is at high risk for complications of withdrawal including seizures, DTs -Will admit to med surg -CIWA protocol -SW consult for possible inpatient treatment -Elevated LFTs are likely related to alcoholism and/or Hep C -Cirrhosis appears to be compensated at this time -Despite significant emesis, she has had minimal hematemesis this  time   DVT prophylaxis: SCDs Code Status:  Full - confirmed with patient Family Communication: None present Disposition Plan:  To be determined Consults called: SW  Admission status: Admit - It is my clinical opinion that admission to INPATIENT is reasonable and necessary because this patient will require at least 2 midnights in the hospital to treat this condition based on the medical complexity of the problems presented.  Given the aforementioned information, the predictability of an adverse outcome is felt to be significant.     Karmen Bongo MD [facility]  If note is complete, please contact covering daytime or nighttime physician. [email or web address] Password [email or web address]  09/11/2017, 5:00 PM

## 2017-09-11 NOTE — ED Provider Notes (Signed)
[facility] EMERGENCY DEPARTMENT Provider Note   CSN: [id number] Arrival date & time: 09/11/17  0935 History   Chief Complaint Chief Complaint  Patient presents with  . Seizures    HPI Briana Sweeney is a 47 y.o. female.  HPI 47 y/o F with a h/o reported seizure disorder, cirrhosis with ascites, hep C, EtOH abuse presented to the ED complaining of seizures.  Patient states she had "silent" seizures 3 days ago. Has not had any today. Reports h/o seizures since childhood.  She describes typical seizures as dizziness, blurred vision, tremulousness, headache, with tongue biting intermittently. Denies any episodes today, but reports that she feels dizzy and tremulous. Also has hallucinations, blurred vision, left-sided headache, abdominal distention, and vomiting with bile and streaks of bright red blood in it.  She denies any coffee-ground emesis or grossly bloody vomit. She has had these sxs since was discharged on 12/27 and she has not taken her medications since she was d/c'ed because she cannot afford them. Last ETOH was yesterday, and pt consumed a bottle of wine and 3 beers. Normally drinks 1/5 of vodka, 1 bottle of wine, and 6 beers daily, but had nausea/vomiting so was unable to drink.  Denies any chest pain or shortness of breath.  Reports dysuria, but denies abdominal pain, diarrhea, constipation, or bloody stools.  Review of records showed pt was admitted from 08/31/17 to 09/04/17 for hematemesis and reported seizures. EGD during admission showed Grade II esophageal varices and portal hypertensive gastropathy.  Past Medical History:  Diagnosis Date  . Anxiety   . Bipolar affective disorder ([place])    With anxiety features  . Cirrhosis of liver ([place])    Due to alcohol and hepatitis C  . Depression   . GERD (gastroesophageal reflux disease)   . Hepatitis C 2018   hepatitis c and alcohol related hepatitis  . History of blood transfusion    "blood doesn't clot; I fell  down and had to have a transfusion"  . History of kidney stones   . Migraine    "when I get really stressed" (09/01/2017)  . Schizophrenia ([place])   . Seizures ([place])    "when I run out of my RX; lots recently" (09/01/2017)    Patient Active Problem List   Diagnosis Date Noted  . Alcohol withdrawal ([place]) 09/11/2017  . Esophageal varices without bleeding ([place])   . Hematemesis 09/01/2017  . Ascites due to alcoholic cirrhosis ([place])   . Decompensated hepatic cirrhosis ([place]) 07/23/2017  . Alcohol abuse 07/23/2017  . Hepatitis C, chronic ([place]) 07/23/2017  . Hypokalemia 07/23/2017  . Jaundice 07/23/2017  . Coagulopathy ([place]) 07/23/2017  . Hypomagnesemia 07/23/2017  . Pancytopenia ([place]) 07/23/2017  . Alcoholic cirrhosis of liver with ascites ([place]) 07/23/2017    Past Surgical History:  Procedure Laterality Date  . ESOPHAGOGASTRODUODENOSCOPY N/A 09/03/2017   Procedure: ESOPHAGOGASTRODUODENOSCOPY (EGD);  Surgeon: Doran Stabler, MD;  Location: [facility];  Service: Gastroenterology;  Laterality: N/A;  . FINGER FRACTURE SURGERY Left    "shattered my pinky"  . FRACTURE SURGERY    . IR PARACENTESIS  07/23/2017  . IR PARACENTESIS  07/2017   "did it twice in the same week" (09/01/2017)  . SHOULDER OPEN ROTATOR CUFF REPAIR Right   . TUBAL LIGATION    . VAGINAL HYSTERECTOMY      OB History    No data available       Home Medications    Prior to Admission medications   Medication  Sig Start Date End Date Taking? Authorizing Provider  furosemide (LASIX) 20 MG tablet Take 1 tablet (20 mg total) by mouth daily. 09/03/17  Yes Bonnielee Haff, MD  pantoprazole (PROTONIX) 40 MG tablet Take 40 mg by mouth daily. 08/06/17  Yes [provider]  pentoxifylline (TRENTAL) 400 MG CR tablet Take 1 tablet (400 mg total) by mouth 3 (three) times daily with meals. 09/03/17  Yes Bonnielee Haff, MD  phenytoin (DILANTIN) 100 MG ER capsule Take 3 capsules (300 mg total) by mouth at bedtime.  09/03/17  Yes Bonnielee Haff, MD  spironolactone (ALDACTONE) 50 MG tablet Take 1 tablet (50 mg total) by mouth daily. 09/03/17  Yes Bonnielee Haff, MD    Family History Family History  Problem Relation Age of Onset  . Lung cancer Mother 29  . Alcohol abuse Mother   . Throat cancer Father 41    Social History Social History   Tobacco Use  . Smoking status: Never Smoker  . Smokeless tobacco: Never Used  Substance Use Topics  . Alcohol use: Yes    Alcohol/week: 268.8 oz    Types: 448 Shots of liquor per week    Comment: 09/01/2017 "maybe 1/2 gallon vodka/day"  . Drug use: No     Allergies   Patient has no known allergies.   Review of Systems Review of Systems  Constitutional: Negative for chills.       Tremulous  HENT: Negative for ear pain and sore throat.   Eyes: Positive for visual disturbance. Negative for pain.  Respiratory: Negative for cough and shortness of breath.   Cardiovascular: Positive for leg swelling. Negative for chest pain and palpitations.  Gastrointestinal: Positive for abdominal distention and abdominal pain. Negative for vomiting.  Genitourinary: Positive for dysuria and frequency. Negative for flank pain, hematuria and urgency.  Musculoskeletal: Negative for arthralgias and back pain.  Skin: Negative for color change and rash.  Neurological: Positive for dizziness and headaches. Negative for seizures and syncope.       Reported seizures  Psychiatric/Behavioral: Positive for hallucinations.  All other systems reviewed and are negative.    Physical Exam Updated Vital Signs BP 118/78   Pulse 92   Temp 98.7 F (37.1 C) (Oral)   Resp 15   Ht 5' (1.524 m)   Wt 59 kg (130 lb)   LMP  (LMP Unknown)   SpO2 95%   BMI 25.39 kg/m   Physical Exam  Constitutional: She is oriented to person, place, and time. She appears well-developed and well-nourished. No distress.  Tremulous on exam  HENT:  Head: Normocephalic and atraumatic.  Eyes:  Conjunctivae and EOM are normal. Pupils are equal, round, and reactive to light. No scleral icterus.  Neck: Normal range of motion. Neck supple.  Cardiovascular: Normal rate, regular rhythm, normal heart sounds and intact distal pulses.  No murmur heard. Pulmonary/Chest: Effort normal and breath sounds normal. No stridor. No respiratory distress. She has no wheezes.  Abdominal: Soft. Bowel sounds are normal. She exhibits distension (mild). There is tenderness (LLQ). There is no rebound and no guarding.  Musculoskeletal:  1+ pitting edema to BLE to mid calves bilat  Neurological: She is alert and oriented to person, place, and time.  Mental Status:  Alert, thought content appropriate, able to give a coherent history. Speech fluent without evidence of aphasia. Able to follow 2 step commands without difficulty.  Normal tone. 5/5 strength of BUE and BLE major muscle groups including strong and equal grip strength and dorsiflexion/plantar  flexion Sensory: light touch normal in all extremities. Gait: normal gait CV: 2+ radial and DP/PT pulses  Skin: Skin is warm and dry. Capillary refill takes less than 2 seconds.  Psychiatric:  Appears anxious  Nursing note and vitals reviewed.    ED Treatments / Results  Labs (all labs ordered are listed, but only abnormal results are displayed) Labs Reviewed  BASIC METABOLIC PANEL - Abnormal; Notable for the following components:      Result Value   BUN <5 (*)    Calcium 8.7 (*)    All other components within normal limits  CBC - Abnormal; Notable for the following components:   WBC 2.5 (*)    RBC 3.48 (*)    Hemoglobin 11.1 (*)    HCT 33.4 (*)    RDW 16.7 (*)    All other components within normal limits  PHENYTOIN LEVEL, TOTAL - Abnormal; Notable for the following components:   Phenytoin Lvl <2.5 (*)    All other components within normal limits  PLATELET COUNT - Abnormal; Notable for the following components:   Platelets 21 (*)    All other  components within normal limits  ETHANOL - Abnormal; Notable for the following components:   Alcohol, Ethyl (B) 42 (*)    All other components within normal limits  LIPASE, BLOOD - Abnormal; Notable for the following components:   Lipase 93 (*)    All other components within normal limits  HEPATIC FUNCTION PANEL - Abnormal; Notable for the following components:   Total Protein 8.6 (*)    Albumin 3.0 (*)    AST 95 (*)    Total Bilirubin 1.9 (*)    Bilirubin, Direct 0.8 (*)    Indirect Bilirubin 1.1 (*)    All other components within normal limits  RAPID URINE DRUG SCREEN, HOSP PERFORMED - Abnormal; Notable for the following components:   Barbiturates POSITIVE (*)    All other components within normal limits  URINALYSIS, ROUTINE W REFLEX MICROSCOPIC - Abnormal; Notable for the following components:   pH 9.0 (*)    All other components within normal limits  AMMONIA - Abnormal; Notable for the following components:   Ammonia 63 (*)    All other components within normal limits  MAGNESIUM - Abnormal; Notable for the following components:   Magnesium 1.6 (*)    All other components within normal limits  PROTIME-INR  CBG MONITORING, ED  I-STAT BETA HCG BLOOD, ED ([facility], [facility], [facility] ONLY)    EKG  EKG Interpretation None       Radiology No results found.  Procedures Procedures (including critical care time)  Medications Ordered in ED Medications  LORazepam (ATIVAN) tablet 2 mg (2 mg Oral Given 09/11/17 1425)  thiamine (B-1) injection 100 mg (100 mg Intravenous Given 09/15/49 0258)  folic acid (FOLVITE) tablet 1 mg (1 mg Oral Given 09/11/17 1533)  potassium chloride 10 mEq in 100 mL IVPB (0 mEq Intravenous Stopped 09/11/17 1640)  pantoprazole (PROTONIX) 80 mg in sodium chloride 0.9 % 100 mL IVPB (0 mg Intravenous Stopped 09/11/17 1720)     Initial Impression / Assessment and Plan / ED Course  I have reviewed the triage vital signs and the nursing notes.  Pertinent labs & imaging results  that were available during my care of the patient were reviewed by me and considered in my medical decision making (see chart for details).  Evaluated pt in the ED. Orders placed. Discussed pt case with Dr. Jeanell Sparrow and she agrees  with the plan.  Attempted US guided IV to LUE which was unsuccessful. Pt tolerated procedure well.    Rechecked pt. VSS. NAD. Pt sleeping comfortably in room. No continued emesis and no seizure like activity noted in the ED. Discussed results of labs and ECG. Discussed plan for admission to hospitalist service for observation. Pt agrees and understands the plan.  Dr. Jeanell Sparrow consulted with hospitalist service and Dr. Lorin Mercy accepted the pt.     Final Clinical Impressions(s) / ED Diagnoses   Final diagnoses:  Alcohol withdrawal syndrome with complication (HCC)  Hematemesis, presence of nausea not specified  Abdominal distension   47 y/o F with a h/o cirrhosis with ascites, hep C, EtOH abuse who presented to the ED complaining of seizures, tremulousness, hallucinations, abdominal distention, and hematemesis. No seizure activity reported since 3 days ago and no seizure activity observed in ED, however phenytoin level low. Does not appear to be post ictal and does not have AMS. Reportedly has established seizure d/o but no focal changes observed today suggesting need for advanced imaging.  Pt symptoms and exam are more suggestive of ETOH withdrawal especially given her decreased ETOH intake over the last 2 days. Have ordered CIWA precautions and gave ativan. Also administered thiamine, folate, and K.   Hematemesis concerning for varices given recent EGD findings, however no episodes observed in the ED and Hgb is only mildly decreased today. Gave PPI.   Abd distension likely secondary to chronic ascites given pt has not had medication for it since d/c. Less likely SBP given lack of diffuse TTP. Lipase, LFTS, and ammonia elevated. Mg low.   Pt afebrile and VSS have remained stable  overall. No seizure activity, but will require admission for hematemesis and ETOH withdrawal.   ED Discharge Orders    None       Rodney Booze, PA-C 09/11/17 1858    Rodney Booze, PA-C 09/11/17 1912    Rodney Booze, PA-C 09/11/17 1916    Pattricia Boss, MD 09/12/17 1349

## 2017-09-11 NOTE — ED Notes (Signed)
Dinner tray ordered clear liquid

## 2017-09-11 NOTE — ED Notes (Signed)
Pt is difficult stick, with hx of such. IV team consulted

## 2017-09-11 NOTE — ED Triage Notes (Signed)
Pt states that she has had seizures today. Pt states that she was recently in the hospital for the same and has not had medication for her seizures since. Pt reports headache as well. Pt states that she has been having hallucinations as well. Pt denies SI/HI.

## 2017-09-11 NOTE — ED Notes (Signed)
Patient refused rectal temperature 

## 2017-09-11 NOTE — ED Provider Notes (Signed)
Liver cirrhosis, failure, alcohol abuse and withdrawal symptoms recently discharged after being admitted for seizures.  She comes in today stating that she has had nausea and some mild hematemesis but has been unable to drink alcohol.  She is shaky and voices that she is having visual hallucinations.  Patient has been given Ativan and appears hemodynamically stable here  Vitals:   09/11/17 1300 09/11/17 1426  BP: (!) 131/91 (!) 131/91  Pulse: (!) 103 (!) 110  Resp: 20   Temp:    SpO2: 99%    WDWN female nad Mildly tachycardiac Lungs cta Abdomen soft distended,no ttp  Discussed with Dr. Lorin Mercy and she will admit    I performed a history and physical examination of Shaakira Borrero and discussed her management with Cortni Couture.  I agree with the history, physical, assessment, and plan of care, with the following exceptions: None  I was present for the following procedures: None Time Spent in Critical Care of the patient: None Time spent in discussions with the patient and family: 45  Edilia Ghuman Waylan Rocher, MD 09/11/17 [phone]

## 2017-09-11 NOTE — ED Notes (Signed)
Magnesium rec sent to lab for add on

## 2017-09-12 DIAGNOSIS — F10239 Alcohol dependence with withdrawal, unspecified: Principal | ICD-10-CM

## 2017-09-12 DIAGNOSIS — F419 Anxiety disorder, unspecified: Secondary | ICD-10-CM

## 2017-09-12 DIAGNOSIS — F1910 Other psychoactive substance abuse, uncomplicated: Secondary | ICD-10-CM

## 2017-09-12 DIAGNOSIS — K920 Hematemesis: Secondary | ICD-10-CM

## 2017-09-12 DIAGNOSIS — Z811 Family history of alcohol abuse and dependence: Secondary | ICD-10-CM

## 2017-09-12 DIAGNOSIS — R443 Hallucinations, unspecified: Secondary | ICD-10-CM

## 2017-09-12 DIAGNOSIS — Z62810 Personal history of physical and sexual abuse in childhood: Secondary | ICD-10-CM

## 2017-09-12 DIAGNOSIS — F1014 Alcohol abuse with alcohol-induced mood disorder: Secondary | ICD-10-CM

## 2017-09-12 DIAGNOSIS — R140 Abdominal distension (gaseous): Secondary | ICD-10-CM

## 2017-09-12 LAB — CBC
HCT: 30.6 % — ABNORMAL LOW (ref 36.0–46.0)
Hemoglobin: 9.8 g/dL — ABNORMAL LOW (ref 12.0–15.0)
MCH: 30.2 pg (ref 26.0–34.0)
MCHC: 32 g/dL (ref 30.0–36.0)
MCV: 94.4 fL (ref 78.0–100.0)
Platelets: 25 10*3/uL — CL (ref 150–400)
RBC: 3.24 MIL/uL — ABNORMAL LOW (ref 3.87–5.11)
RDW: 16.2 % — ABNORMAL HIGH (ref 11.5–15.5)
WBC: 2.2 10*3/uL — ABNORMAL LOW (ref 4.0–10.5)

## 2017-09-12 LAB — BASIC METABOLIC PANEL
Anion gap: 7 (ref 5–15)
BUN: <5 mg/dL — ABNORMAL LOW (ref 6–20)
CO2: 23 mmol/L (ref 22–32)
Calcium: 8.5 mg/dL — ABNORMAL LOW (ref 8.9–10.3)
Chloride: 105 mmol/L (ref 101–111)
Creatinine, Ser: 0.52 mg/dL (ref 0.44–1.00)
GFR calc Af Amer: >60 mL/min (ref >60)
GFR calc non Af Amer: >60 mL/min (ref >60)
Glucose, Bld: 83 mg/dL (ref 65–99)
Potassium: 3.4 mmol/L — ABNORMAL LOW (ref 3.5–5.1)
Sodium: 135 mmol/L (ref 135–145)

## 2017-09-12 MED ORDER — BUSPIRONE HCL 15 MG PO TABS
7.5000 mg | ORAL_TABLET | Freq: Two times a day (BID) | ORAL | Status: DC
  Administered 2017-09-12 – 2017-09-16 (×8): 7.5 mg via ORAL
  Filled 2017-09-12 (×8): qty 1

## 2017-09-12 MED ORDER — PRO-STAT SUGAR FREE PO LIQD
30.0000 mL | Freq: Every day | ORAL | Status: DC
  Administered 2017-09-12 – 2017-09-16 (×5): 30 mL via ORAL
  Filled 2017-09-12 (×5): qty 30

## 2017-09-12 MED ORDER — TOPIRAMATE 25 MG PO TABS
50.0000 mg | ORAL_TABLET | Freq: Every day | ORAL | Status: DC
  Administered 2017-09-12 – 2017-09-16 (×5): 50 mg via ORAL
  Filled 2017-09-12 (×5): qty 2

## 2017-09-12 MED ORDER — HALOPERIDOL 1 MG PO TABS
2.0000 mg | ORAL_TABLET | Freq: Every day | ORAL | Status: DC
  Administered 2017-09-12 – 2017-09-15 (×4): 2 mg via ORAL
  Filled 2017-09-12 (×4): qty 2

## 2017-09-12 NOTE — Progress Notes (Signed)
CSW spoke with pt concerning current substance abuse.  Pt would like to go to inpatient rehab at this time- states that this admission has made her realize how detrimental her drinking habits have been and the need to stop.  Patient has no insurance so discussed local rehab options for self pay- informed pt she needs to call facilities to set up intake appointment when she is informed of her DC date  Please see CSW note from 12/26 for full assessment.  No further needs at this time- CSW signing off  Jorge Ny, [place] Social Worker [phone]

## 2017-09-12 NOTE — Care Management Note (Signed)
Case Management Note  Patient Details  Name: Zabella Wease MRN: [id number] Date of Birth: Aug 30, 1971  Subjective/Objective:  Pt admitted with alcohol withdrawal. She is from home with a roommate. Pt has no insurance. She was set up with [facility] at prior admission but missed appointment yesterday d/t coming to hospital.                 Action/Plan: CM called [facility] and was able to get her a new appointment. Information on the AVS.  CM is unable to provide assistance with transportation between [facility] and [place]. Pt will have to arrange her own transportation for medical appointments. Pt is not able to receive a MATCH letter at d/c since she received one recently. CM can provide her coupons or medications assistance cards at d/c. CM following.  Expected Discharge Date:  09/15/17               Expected Discharge Plan:     In-House Referral:     Discharge planning Services  CM Consult, [facility], Medication Assistance  Post Acute Care Choice:    Choice offered to:     DME Arranged:    DME Agency:     HH Arranged:    HH Agency:     Status of Service:  In process, will continue to follow  If discussed at Long Length of Stay Meetings, dates discussed:    Additional Comments:  Pollie Friar, RN 09/12/2017, 3:59 PM

## 2017-09-12 NOTE — Progress Notes (Signed)
Initial Nutrition Assessment  DOCUMENTATION CODES:   Not applicable  INTERVENTION:  Continue Boost Breeze po TID, each supplement provides 250 kcal and 9 grams of protein.  Discontinue Ensure  Provide 30 ml Prostat po once daily, each supplement provides 100 kcal and 15 grams of protein.   Monitor magnesium, potassium, and phosphorus daily for at least 3 days, MD to replete as needed, as pt is at risk for refeeding syndrome given ETOH abuse.  Encourage adequate PO intake.   NUTRITION DIAGNOSIS:   Increased nutrient needs related to chronic illness as evidenced by estimated needs.  GOAL:   Patient will meet greater than or equal to 90% of their needs  MONITOR:   PO intake, Supplement acceptance, Labs, Weight trends, I & O's, Skin  REASON FOR ASSESSMENT:   Malnutrition Screening Tool    ASSESSMENT:   47 year old female with history of seizures, schizophrenia/bipolar disorder, hepatitis C, GERD, depression, alcoholic cirrhosis presented with emesis, alcohol withdrawals.    Diet has been advanced to a heart healthy diet. Pt reports poor po intake currently and PTA. Pt reports she has been consuming her calories in alcohol. Pt at risk for refeeding syndrome related to alcohol abuse. Per weight records, pt with a 6.6% weight loss in 2 months. Pt reports weight loss related to fluid status. Pt currently has Boost Breeze and Ensure ordered. Pt reports Ensure causes abdominal discomfort. RD to modify orders and order Prostat instead to aid in adequate protein needs.   Labs and medications reviewed. Potassium low at 3.4. Magnesium low at 1.6.  NUTRITION - FOCUSED PHYSICAL EXAM:    Most Recent Value  Orbital Region  No depletion  Upper Arm Region  No depletion  Thoracic and Lumbar Region  No depletion  Buccal Region  No depletion  Temple Region  No depletion  Clavicle Bone Region  No depletion  Clavicle and Acromion Bone Region  No depletion  Scapular Bone Region  No  depletion  Dorsal Hand  No depletion  Patellar Region  No depletion  Anterior Thigh Region  No depletion  Posterior Calf Region  No depletion  Edema (RD Assessment)  None  Hair  Reviewed  Eyes  Reviewed  Mouth  Reviewed  Skin  Reviewed  Nails  Reviewed       Diet Order:  Diet Heart Room service appropriate? Yes; Fluid consistency: Thin  EDUCATION NEEDS:   Not appropriate for education at this time  Skin:  Skin Assessment: Reviewed RN Assessment  Last BM:  1/3  Height:   Ht Readings from Last 1 Encounters:  09/11/17 5' (1.524 m)    Weight:   Wt Readings from Last 1 Encounters:  09/11/17 128 lb 1.4 oz (58.1 kg)    Ideal Body Weight:  45.45 kg  BMI:  Body mass index is 25.02 kg/m.  Estimated Nutritional Needs:   Kcal:  4888-9169  Protein:  85-100 grams  Fluid:  1.7 - 1.9 L/day    Corrin Parker, MS, RD, LDN Pager # [phone] After hours/ weekend pager # [phone]

## 2017-09-12 NOTE — Progress Notes (Signed)
[facility]                                                                              Patient Demographics  Briana Sweeney, is a 47 y.o. female, DOB - 1971-07-15, OVF:[id number]  Admit date - 09/11/2017   Admitting Physician Karmen Bongo, MD  Outpatient Primary MD for the patient is Patient, No Pcp Per  Outpatient specialists:   LOS - 1  days   Medical records reviewed and are as summarized below:    Chief Complaint  Patient presents with  . Seizures       Brief summary   Patient is a 47 year old female with history of seizures, schizophrenia/bipolar disorder, hepatitis C, GERD, depression, alcoholic cirrhosis presented with emesis, alcohol withdrawals.  Patient was recently hospitalized 12/23-27 for hematemesis associated with esophageal varices.She reported resumption of drinking after discharge but in the last few days she had a seizure, hallucinations, emesis, and has felt very dizzy.  Last drink a day before the admission, drinks beer and vodka.  History of alcohol withdrawal seizures, has been drinking since she was 29-24 years old.     Assessment & Plan    Principal Problem:   Alcohol withdrawal (HCC) -Alcohol level 42 on admit, continue CIWA with Ativan, thiamine, folate, MVI -Patient interested in rehab for alcohol, also states that she has not been taking her psych medications and having schizophrenia symptoms/hallucinations. -Psychiatry consulted  Active Problems: History of schizophrenia, bipolar disease -Psychiatry consulted for medications    Hepatitis C, chronic ([place]),  Alcoholic cirrhosis of liver with ascites ([place]) -LFTs elevated likely related to hep C and alcoholism, continues to drink  Thrombocytopenia -Likely due to alcoholic liver disease, follow closely  Liver cirrhosis -Currently compensated, continue Aldactone  Code Status: Full code start DVT Prophylaxis:  SCD's Family Communication: Discussed in detail with the  patient, all imaging results, lab results explained to the patient   Disposition Plan:   Time Spent in minutes   25 minutes  Procedures:    Consultants:   Psychiatry  Antimicrobials:      Medications  Scheduled Meds: . docusate sodium  100 mg Oral BID  . feeding supplement  1 Container Oral TID BM  . feeding supplement (ENSURE ENLIVE)  237 mL Oral BID BM  . folic acid  1 mg Oral Daily  . LORazepam  0-4 mg Intravenous Q6H   Followed by  . [START ON 09/14/2017] LORazepam  0-4 mg Intravenous Q12H  . multivitamin with minerals  1 tablet Oral Daily  . pantoprazole (PROTONIX) IV  40 mg Intravenous Q24H  . pentoxifylline  400 mg Oral TID WC  . phenytoin  300 mg Oral QHS  . spironolactone  50 mg Oral Daily  . thiamine  100 mg Oral Daily   Or  . thiamine  100 mg Intravenous Daily   Continuous Infusions: . lactated ringers 75 mL/hr at 09/11/17 2251   PRN Meds:.acetaminophen **OR** acetaminophen, LORazepam **OR** LORazepam, ondansetron **OR** ondansetron (ZOFRAN) IV   Antibiotics   Anti-infectives (From admission, onward)   None        Subjective:   Briana Sweeney was seen  and examined today.  Feels slightly better from yesterday however still has tremulousness and feeling anxious, tachycardia.  Patient denies dizziness, chest pain, shortness of breath, abdominal pain, N/V/D/C, new weakness.    Objective:   Vitals:   09/11/17 2238 09/12/17 0025 09/12/17 0632 09/12/17 1103  BP: 102/71 110/77 109/70 111/76  Pulse: 99 89 93 (!) 103  Resp: 18 18 18 17   Temp: 99 F (37.2 C)  98.8 F (37.1 C) 98 F (36.7 C)  TempSrc: Oral  Oral Oral  SpO2: 98% 100% 100% 97%  Weight: 58.1 kg (128 lb 1.4 oz)     Height: 5' (1.524 m)       Intake/Output Summary (Last 24 hours) at 09/12/2017 1213 Last data filed at 09/12/2017 0500 Gross per 24 hour  Intake 561.25 ml  Output -  Net 561.25 ml     Wt Readings from Last 3 Encounters:  09/11/17 58.1 kg (128 lb 1.4 oz)  09/03/17 58.3  kg (128 lb 8.4 oz)  07/28/17 62.2 kg (137 lb 3.2 oz)     Exam  General: Alert and oriented x 3, NAD, anxious  Eyes:  HEENT:  Atraumatic, normocephalic  Cardiovascular: S1 S2 auscultated, no rubs, murmurs or gallops. Regular rate and rhythm.  Tachycardia  Respiratory: Clear to auscultation bilaterally, no wheezing, rales or rhonchi  Gastrointestinal: Soft, nontender, nondistended, + bowel sounds  Ext: no pedal edema bilaterally, tremulous  Neuro: no new deficits  Musculoskeletal: No digital cyanosis, clubbing  Skin: No rashes  Psych: anxious, alert and oriented x3    Data Reviewed:  I have personally reviewed following labs and imaging studies  Micro Results No results found for this or any previous visit (from the past 240 hour(s)).  Radiology Reports No results found.  Lab Data:  CBC: Recent Labs  Lab 09/11/17 1024 09/11/17 1315 09/12/17 0258  WBC 2.5*  --  2.2*  HGB 11.1*  --  9.8*  HCT 33.4*  --  30.6*  MCV 96.0  --  94.4  PLT PLATELET CLUMPS NOTED ON SMEAR, COUNT APPEARS DECREASED 21* 25*   Basic Metabolic Panel: Recent Labs  Lab 09/11/17 1024 09/11/17 1315 09/12/17 0258  NA 136  --  135  K 3.8  --  3.4*  CL 104  --  105  CO2 22  --  23  GLUCOSE 89  --  83  BUN <5*  --  <5*  CREATININE 0.52  --  0.52  CALCIUM 8.7*  --  8.5*  MG  --  1.6*  --    GFR: Estimated Creatinine Clearance: 70.8 mL/min (by C-G formula based on SCr of 0.52 mg/dL). Liver Function Tests: Recent Labs  Lab 09/11/17 1315  AST 95*  ALT 25  ALKPHOS 123  BILITOT 1.9*  PROT 8.6*  ALBUMIN 3.0*   Recent Labs  Lab 09/11/17 1315  LIPASE 93*   Recent Labs  Lab 09/11/17 1509  AMMONIA 63*   Coagulation Profile: Recent Labs  Lab 09/11/17 1352  INR 1.19   Cardiac Enzymes: No results for input(s): CKTOTAL, CKMB, CKMBINDEX, TROPONINI in the last 168 hours. BNP (last 3 results) No results for input(s): PROBNP in the last 8760 hours. HbA1C: No results for  input(s): HGBA1C in the last 72 hours. CBG: Recent Labs  Lab 09/11/17 1252  GLUCAP 75   Lipid Profile: No results for input(s): CHOL, HDL, LDLCALC, TRIG, CHOLHDL, LDLDIRECT in the last 72 hours. Thyroid Function Tests: No results for input(s): TSH, T4TOTAL, FREET4, T3FREE,  THYROIDAB in the last 72 hours. Anemia Panel: No results for input(s): VITAMINB12, FOLATE, FERRITIN, TIBC, IRON, RETICCTPCT in the last 72 hours. Urine analysis:    Component Value Date/Time   COLORURINE YELLOW 09/11/2017 [place] 09/11/2017 1337   LABSPEC 1.010 09/11/2017 1337   PHURINE 9.0 (H) 09/11/2017 1337   GLUCOSEU NEGATIVE 09/11/2017 1337   HGBUR NEGATIVE 09/11/2017 1337   BILIRUBINUR NEGATIVE 09/11/2017 1337   KETONESUR NEGATIVE 09/11/2017 1337   PROTEINUR NEGATIVE 09/11/2017 1337   NITRITE NEGATIVE 09/11/2017 1337   LEUKOCYTESUR NEGATIVE 09/11/2017 1337     Briana Sweeney M.D. [facility] 09/12/2017, 12:13 PM  Pager: [phone] Between 7am to 7pm - call Pager - [phone]  After 7pm go to [email or web address] - password [email or web address]  Call night coverage person covering after 7pm

## 2017-09-12 NOTE — Plan of Care (Signed)
Patient in NAD. No seizure activity noted. Patient reported some headache, Tylenol given as ordered.
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
## 2017-09-13 DIAGNOSIS — F1014 Alcohol abuse with alcohol-induced mood disorder: Secondary | ICD-10-CM

## 2017-09-13 LAB — BASIC METABOLIC PANEL
Anion gap: 7 (ref 5–15)
BUN: <5 mg/dL — ABNORMAL LOW (ref 6–20)
CO2: 21 mmol/L — ABNORMAL LOW (ref 22–32)
Calcium: 9.3 mg/dL (ref 8.9–10.3)
Chloride: 111 mmol/L (ref 101–111)
Creatinine, Ser: 0.6 mg/dL (ref 0.44–1.00)
GFR calc Af Amer: >60 mL/min (ref >60)
GFR calc non Af Amer: >60 mL/min (ref >60)
Glucose, Bld: 114 mg/dL — ABNORMAL HIGH (ref 65–99)
Potassium: 3.8 mmol/L (ref 3.5–5.1)
Sodium: 139 mmol/L (ref 135–145)

## 2017-09-13 LAB — CBC
HCT: 32.4 % — ABNORMAL LOW (ref 36.0–46.0)
Hemoglobin: 10.4 g/dL — ABNORMAL LOW (ref 12.0–15.0)
MCH: 31.1 pg (ref 26.0–34.0)
MCHC: 32.1 g/dL (ref 30.0–36.0)
MCV: 97 fL (ref 78.0–100.0)
Platelets: DECREASED 10*3/uL (ref 150–400)
RBC: 3.34 MIL/uL — ABNORMAL LOW (ref 3.87–5.11)
RDW: 16.1 % — ABNORMAL HIGH (ref 11.5–15.5)
WBC: 2.5 10*3/uL — ABNORMAL LOW (ref 4.0–10.5)

## 2017-09-13 MED ORDER — PANTOPRAZOLE SODIUM 40 MG PO TBEC
40.0000 mg | DELAYED_RELEASE_TABLET | Freq: Every day | ORAL | Status: DC
  Administered 2017-09-14 – 2017-09-16 (×3): 40 mg via ORAL
  Filled 2017-09-13 (×3): qty 1

## 2017-09-13 MED ORDER — CHLORHEXIDINE GLUCONATE 0.12 % MT SOLN
15.0000 mL | Freq: Two times a day (BID) | OROMUCOSAL | Status: DC
  Administered 2017-09-13 – 2017-09-16 (×6): 15 mL via OROMUCOSAL
  Filled 2017-09-13 (×7): qty 15

## 2017-09-13 NOTE — Progress Notes (Signed)
Pt made statement to NT that she didn't believe RN gave her ativan at [facility]; NT was in room at the time of administration and did witness this. Pt has been awake most of the shift, c/o inability to sleep, stating to RN, "they need to give me more of that medicine".

## 2017-09-13 NOTE — Progress Notes (Signed)
[facility]                                                                              Patient Demographics  Briana Sweeney, is a 47 y.o. female, DOB - [birth year], GYK:[id number]  Admit date - 09/11/2017   Admitting Physician Karmen Bongo, MD  Outpatient Primary MD for the patient is Patient, No Pcp Per  Outpatient specialists:   LOS - 2  days   Medical records reviewed and are as summarized below:    Chief Complaint  Patient presents with  . Seizures       Brief summary   Patient is a 48 year old female with history of seizures, schizophrenia/bipolar disorder, hepatitis C, GERD, depression, alcoholic cirrhosis presented with emesis, alcohol withdrawals.  Patient was recently hospitalized 12/23-27 for hematemesis associated with esophageal varices.She reported resumption of drinking after discharge but in the last few days she had a seizure, hallucinations, emesis, and has felt very dizzy.  Last drink a day before the admission, drinks beer and vodka.  History of alcohol withdrawal seizures, has been drinking since she was 33-26 years old.     Assessment & Plan    Principal Problem:   Alcohol withdrawal (HCC) -Alcohol level 42 on admit, continue CIWA with Ativan, thiamine, folate, MVI -Patient interested in rehab for alcohol, also states that she has not been taking her psych medications and having schizophrenia symptoms/hallucinations. -Psychiatry consulted, medications adjusted, tearful today, continue CIWA with Ativan  Active Problems: History of schizophrenia, bipolar disease -Psychiatry consulted for medications, appreciate recommendations    Hepatitis C, chronic ([place]),  Alcoholic cirrhosis of liver with ascites ([place]) -LFTs elevated likely related to hep C and alcoholism, continues to drink  Thrombocytopenia -Likely due to alcoholic liver disease,follow counts  Liver cirrhosis -Currently compensated, continue Aldactone -Hold Lasix, DC IV  fluids  Code Status: Full code  DVT Prophylaxis:  SCD's Family Communication: Discussed in detail with the patient, all imaging results, lab results explained to the patient   Disposition Plan:   Time Spent in minutes   25 minutes  Procedures:    Consultants:   Psychiatry  Antimicrobials:      Medications  Scheduled Meds: . busPIRone  7.5 mg Oral BID  . docusate sodium  100 mg Oral BID  . feeding supplement  1 Container Oral TID BM  . feeding supplement (PRO-STAT SUGAR FREE 64)  30 mL Oral Daily  . folic acid  1 mg Oral Daily  . haloperidol  2 mg Oral QHS  . LORazepam  0-4 mg Intravenous Q6H   Followed by  . [START ON 09/14/2017] LORazepam  0-4 mg Intravenous Q12H  . multivitamin with minerals  1 tablet Oral Daily  . [START ON 09/14/2017] pantoprazole  40 mg Oral Daily  . pentoxifylline  400 mg Oral TID WC  . phenytoin  300 mg Oral QHS  . spironolactone  50 mg Oral Daily  . thiamine  100 mg Oral Daily  . topiramate  50 mg Oral Daily   Continuous Infusions:  PRN Meds:.acetaminophen **OR** acetaminophen, LORazepam **OR** LORazepam, ondansetron **OR** ondansetron (ZOFRAN) IV   Antibiotics   Anti-infectives (  From admission, onward)   None        Subjective:   Briana Sweeney was seen and examined today.  Feeling tearful and anxious today.  Still somewhat tremulous but improving.   Patient denies dizziness, chest pain, shortness of breath, abdominal pain, N/V/D/C, new weakness.    Objective:   Vitals:   09/13/17 0016 09/13/17 0439 09/13/17 1107 09/13/17 1452  BP: 106/76 101/64 120/80 116/61  Pulse: (!) 103 92 95 (!) 103  Resp: 18 18 18 18   Temp: 98.9 F (37.2 C) 98.6 F (37 C) 98.4 F (36.9 C) 98 F (36.7 C)  TempSrc: Oral Oral Oral Axillary  SpO2: 100% 100% 100% 98%  Weight:      Height:       No intake or output data in the 24 hours ending 09/13/17 1638   Wt Readings from Last 3 Encounters:  09/11/17 58.1 kg (128 lb 1.4 oz)  09/03/17 58.3 kg  (128 lb 8.4 oz)  07/28/17 62.2 kg (137 lb 3.2 oz)     Exam   General: Alert and oriented x 3, NAD, anxious   Eyes:   HEENT:    Cardiovascular: S1 S2 auscultated, no rubs, murmurs or gallops. Regular rate and rhythm. No pedal edema b/l  Respiratory: Clear to auscultation bilaterally, no wheezing, rales or rhonchi  Gastrointestinal: Soft, nontender, nondistended, + bowel sounds  Ext: no pedal edema bilaterally  Neuro: no new deficits  Musculoskeletal: No digital cyanosis, clubbing  Skin: No rashes  Psych: Normal affect and demeanor, alert and oriented x3     Data Reviewed:  I have personally reviewed following labs and imaging studies  Micro Results No results found for this or any previous visit (from the past 240 hour(s)).  Radiology Reports No results found.  Lab Data:  CBC: Recent Labs  Lab 09/11/17 1024 09/11/17 1315 09/12/17 0258 09/13/17 0358  WBC 2.5*  --  2.2* 2.5*  HGB 11.1*  --  9.8* 10.4*  HCT 33.4*  --  30.6* 32.4*  MCV 96.0  --  94.4 97.0  PLT PLATELET CLUMPS NOTED ON SMEAR, COUNT APPEARS DECREASED 21* 25* PLATELET CLUMPS NOTED ON SMEAR, COUNT APPEARS DECREASED   Basic Metabolic Panel: Recent Labs  Lab 09/11/17 1024 09/11/17 1315 09/12/17 0258 09/13/17 0358  NA 136  --  135 139  K 3.8  --  3.4* 3.8  CL 104  --  105 111  CO2 22  --  23 21*  GLUCOSE 89  --  83 114*  BUN <5*  --  <5* <5*  CREATININE 0.52  --  0.52 0.60  CALCIUM 8.7*  --  8.5* 9.3  MG  --  1.6*  --   --    GFR: Estimated Creatinine Clearance: 70.8 mL/min (by C-G formula based on SCr of 0.6 mg/dL). Liver Function Tests: Recent Labs  Lab 09/11/17 1315  AST 95*  ALT 25  ALKPHOS 123  BILITOT 1.9*  PROT 8.6*  ALBUMIN 3.0*   Recent Labs  Lab 09/11/17 1315  LIPASE 93*   Recent Labs  Lab 09/11/17 1509  AMMONIA 63*   Coagulation Profile: Recent Labs  Lab 09/11/17 1352  INR 1.19   Cardiac Enzymes: No results for input(s): CKTOTAL, CKMB, CKMBINDEX,  TROPONINI in the last 168 hours. BNP (last 3 results) No results for input(s): PROBNP in the last 8760 hours. HbA1C: No results for input(s): HGBA1C in the last 72 hours. CBG: Recent Labs  Lab 09/11/17 1252  GLUCAP 75  Lipid Profile: No results for input(s): CHOL, HDL, LDLCALC, TRIG, CHOLHDL, LDLDIRECT in the last 72 hours. Thyroid Function Tests: No results for input(s): TSH, T4TOTAL, FREET4, T3FREE, THYROIDAB in the last 72 hours. Anemia Panel: No results for input(s): VITAMINB12, FOLATE, FERRITIN, TIBC, IRON, RETICCTPCT in the last 72 hours. Urine analysis:    Component Value Date/Time   COLORURINE YELLOW 09/11/2017 [place] 09/11/2017 1337   LABSPEC 1.010 09/11/2017 1337   PHURINE 9.0 (H) 09/11/2017 1337   GLUCOSEU NEGATIVE 09/11/2017 1337   HGBUR NEGATIVE 09/11/2017 1337   BILIRUBINUR NEGATIVE 09/11/2017 1337   KETONESUR NEGATIVE 09/11/2017 1337   PROTEINUR NEGATIVE 09/11/2017 1337   NITRITE NEGATIVE 09/11/2017 1337   LEUKOCYTESUR NEGATIVE 09/11/2017 1337     Nancyann Cotterman M.D. [facility] 09/13/2017, 4:38 PM  Pager: [phone] Between 7am to 7pm - call Pager - [phone]  After 7pm go to [email or web address] - password [email or web address]  Call night coverage person covering after 7pm

## 2017-09-14 LAB — BASIC METABOLIC PANEL
Anion gap: 7 (ref 5–15)
BUN: <5 mg/dL — ABNORMAL LOW (ref 6–20)
CO2: 18 mmol/L — ABNORMAL LOW (ref 22–32)
Calcium: 8.8 mg/dL — ABNORMAL LOW (ref 8.9–10.3)
Chloride: 110 mmol/L (ref 101–111)
Creatinine, Ser: 0.66 mg/dL (ref 0.44–1.00)
GFR calc Af Amer: >60 mL/min (ref >60)
GFR calc non Af Amer: >60 mL/min (ref >60)
Glucose, Bld: 150 mg/dL — ABNORMAL HIGH (ref 65–99)
Potassium: 3.3 mmol/L — ABNORMAL LOW (ref 3.5–5.1)
Sodium: 135 mmol/L (ref 135–145)

## 2017-09-14 LAB — CBC
HCT: 32.3 % — ABNORMAL LOW (ref 36.0–46.0)
Hemoglobin: 10.2 g/dL — ABNORMAL LOW (ref 12.0–15.0)
MCH: 30.3 pg (ref 26.0–34.0)
MCHC: 31.6 g/dL (ref 30.0–36.0)
MCV: 95.8 fL (ref 78.0–100.0)
Platelets: 27 10*3/uL — CL (ref 150–400)
RBC: 3.37 MIL/uL — ABNORMAL LOW (ref 3.87–5.11)
RDW: 16.2 % — ABNORMAL HIGH (ref 11.5–15.5)
WBC: 2.8 10*3/uL — ABNORMAL LOW (ref 4.0–10.5)

## 2017-09-14 MED ORDER — DIPHENHYDRAMINE HCL 50 MG/ML IJ SOLN
12.5000 mg | Freq: Four times a day (QID) | INTRAMUSCULAR | Status: DC | PRN
  Administered 2017-09-15: 12.5 mg via INTRAVENOUS
  Filled 2017-09-14: qty 1

## 2017-09-14 NOTE — Progress Notes (Signed)
[facility]                                                                              Patient Demographics  Briana Sweeney, is a 47 y.o. female, DOB - 07-May-1971, ZWC:[id number]  Admit date - 09/11/2017   Admitting Physician Karmen Bongo, MD  Outpatient Primary MD for the patient is Patient, No Pcp Per  Outpatient specialists:   LOS - 3  days   Medical records reviewed and are as summarized below:    Chief Complaint  Patient presents with  . Seizures       Brief summary   Patient is a 47 year old female with history of seizures, schizophrenia/bipolar disorder, hepatitis C, GERD, depression, alcoholic cirrhosis presented with emesis, alcohol withdrawals.  Patient was recently hospitalized 12/23-27 for hematemesis associated with esophageal varices.She reported resumption of drinking after discharge but in the last few days she had a seizure, hallucinations, emesis, and has felt very dizzy.  Last drink a day before the admission, drinks beer and vodka.  History of alcohol withdrawal seizures, has been drinking since she was 31-27 years old.   Assessment & Plan    Principal Problem:   Alcohol withdrawal ([place]) - Alcohol level 42 on admit, continue CIWA with Ativan, thiamine, folate, MVI - Patient interested in rehab for alcohol, also states that she has not been taking her psych medications and having schizophrenia symptoms/hallucinations. - Psychiatry was consulted, medications adjusted - continue CIWA with Ativan - patient interested in [place], [place], SW consulted   Active Problems: History of schizophrenia, bipolar disease -Psychiatry was consulted for medications, appreciate recommendations    Hepatitis C, chronic ([place]),  Alcoholic cirrhosis of liver with ascites ([place]) -LFTs elevated likely related to hep C and alcoholism, continues to drink  Thrombocytopenia -Likely due to alcoholic liver disease,follow counts  Liver cirrhosis -Currently  compensated, continue Aldactone -Hold Lasix, DC IV fluids  Code Status: Full code  DVT Prophylaxis:  SCD's Family Communication: Discussed in detail with the patient, all imaging results, lab results explained to the patient   Disposition Plan:   Time Spent in minutes   25 minutes  Procedures:    Consultants:   Psychiatry  Antimicrobials:      Medications  Scheduled Meds: . busPIRone  7.5 mg Oral BID  . chlorhexidine  15 mL Mouth/Throat BID  . docusate sodium  100 mg Oral BID  . feeding supplement  1 Container Oral TID BM  . feeding supplement (PRO-STAT SUGAR FREE 64)  30 mL Oral Daily  . folic acid  1 mg Oral Daily  . haloperidol  2 mg Oral QHS  . LORazepam  0-4 mg Intravenous Q12H  . multivitamin with minerals  1 tablet Oral Daily  . pantoprazole  40 mg Oral Daily  . pentoxifylline  400 mg Oral TID WC  . phenytoin  300 mg Oral QHS  . spironolactone  50 mg Oral Daily  . thiamine  100 mg Oral Daily  . topiramate  50 mg Oral Daily   Continuous Infusions:  PRN Meds:.acetaminophen **OR** acetaminophen, diphenhydrAMINE, LORazepam **OR** LORazepam, ondansetron **OR** ondansetron (ZOFRAN) IV   Antibiotics  Anti-infectives (From admission, onward)   None        Subjective:   Chyler Creely was seen and examined today.  Still somewhat tremulous however hopeful that she can go to a residential alcohol rehab place.  Having some itching.   Patient denies dizziness, chest pain, shortness of breath, abdominal pain, N/V/D/C, new weakness.    Objective:   Vitals:   09/13/17 2112 09/14/17 0130 09/14/17 0505 09/14/17 1006  BP: 103/69 107/71 109/77 109/70  Pulse: (!) 103 96 98 (!) 101  Resp: 18 20 20 19   Temp: 98.3 F (36.8 C) 98.1 F (36.7 C) 98.1 F (36.7 C) 98.3 F (36.8 C)  TempSrc: Oral Oral Oral Oral  SpO2: 98% 98% 100% 100%  Weight:      Height:       No intake or output data in the 24 hours ending 09/14/17 1242   Wt Readings from Last 3 Encounters:   09/11/17 58.1 kg (128 lb 1.4 oz)  09/03/17 58.3 kg (128 lb 8.4 oz)  07/28/17 62.2 kg (137 lb 3.2 oz)     Exam   General: Alert and oriented x 3, NAD  Eyes:   HEENT:    Cardiovascular: S1 S2 clear, RRR,No pedal edema b/l  Respiratory: CTA B  Gastrointestinal: Soft, nontender, nondistended, + bowel sounds  Ext: no pedal edema bilaterally  Neuro: no new deficit  Musculoskeletal: No digital cyanosis, clubbing  Skin: No rashes  Psych: Normal affect and demeanor, alert and oriented x3     Data Reviewed:  I have personally reviewed following labs and imaging studies  Micro Results No results found for this or any previous visit (from the past 240 hour(s)).  Radiology Reports No results found.  Lab Data:  CBC: Recent Labs  Lab 09/11/17 1024 09/11/17 1315 09/12/17 0258 09/13/17 0358 09/14/17 0323  WBC 2.5*  --  2.2* 2.5* 2.8*  HGB 11.1*  --  9.8* 10.4* 10.2*  HCT 33.4*  --  30.6* 32.4* 32.3*  MCV 96.0  --  94.4 97.0 95.8  PLT PLATELET CLUMPS NOTED ON SMEAR, COUNT APPEARS DECREASED 21* 25* PLATELET CLUMPS NOTED ON SMEAR, COUNT APPEARS DECREASED 27*   Basic Metabolic Panel: Recent Labs  Lab 09/11/17 1024 09/11/17 1315 09/12/17 0258 09/13/17 0358 09/14/17 0323  NA 136  --  135 139 135  K 3.8  --  3.4* 3.8 3.3*  CL 104  --  105 111 110  CO2 22  --  23 21* 18*  GLUCOSE 89  --  83 114* 150*  BUN <5*  --  <5* <5* <5*  CREATININE 0.52  --  0.52 0.60 0.66  CALCIUM 8.7*  --  8.5* 9.3 8.8*  MG  --  1.6*  --   --   --    GFR: Estimated Creatinine Clearance: 70.8 mL/min (by C-G formula based on SCr of 0.66 mg/dL). Liver Function Tests: Recent Labs  Lab 09/11/17 1315  AST 95*  ALT 25  ALKPHOS 123  BILITOT 1.9*  PROT 8.6*  ALBUMIN 3.0*   Recent Labs  Lab 09/11/17 1315  LIPASE 93*   Recent Labs  Lab 09/11/17 1509  AMMONIA 63*   Coagulation Profile: Recent Labs  Lab 09/11/17 1352  INR 1.19   Cardiac Enzymes: No results for input(s):  CKTOTAL, CKMB, CKMBINDEX, TROPONINI in the last 168 hours. BNP (last 3 results) No results for input(s): PROBNP in the last 8760 hours. HbA1C: No results for input(s): HGBA1C in the last 72  hours. CBG: Recent Labs  Lab 09/11/17 1252  GLUCAP 75   Lipid Profile: No results for input(s): CHOL, HDL, LDLCALC, TRIG, CHOLHDL, LDLDIRECT in the last 72 hours. Thyroid Function Tests: No results for input(s): TSH, T4TOTAL, FREET4, T3FREE, THYROIDAB in the last 72 hours. Anemia Panel: No results for input(s): VITAMINB12, FOLATE, FERRITIN, TIBC, IRON, RETICCTPCT in the last 72 hours. Urine analysis:    Component Value Date/Time   COLORURINE YELLOW 09/11/2017 [place] 09/11/2017 1337   LABSPEC 1.010 09/11/2017 1337   PHURINE 9.0 (H) 09/11/2017 1337   GLUCOSEU NEGATIVE 09/11/2017 1337   HGBUR NEGATIVE 09/11/2017 1337   BILIRUBINUR NEGATIVE 09/11/2017 1337   KETONESUR NEGATIVE 09/11/2017 1337   PROTEINUR NEGATIVE 09/11/2017 1337   NITRITE NEGATIVE 09/11/2017 1337   LEUKOCYTESUR NEGATIVE 09/11/2017 1337     Ripudeep Rai M.D. [facility] 09/14/2017, 12:42 PM  Pager: [phone] Between 7am to 7pm - call Pager - [phone]  After 7pm go to [email or web address] - password [email or web address]  Call night coverage person covering after 7pm

## 2017-09-15 LAB — BASIC METABOLIC PANEL
Anion gap: 8 (ref 5–15)
BUN: <5 mg/dL — ABNORMAL LOW (ref 6–20)
CO2: 18 mmol/L — ABNORMAL LOW (ref 22–32)
Calcium: 8.6 mg/dL — ABNORMAL LOW (ref 8.9–10.3)
Chloride: 110 mmol/L (ref 101–111)
Creatinine, Ser: 0.55 mg/dL (ref 0.44–1.00)
GFR calc Af Amer: >60 mL/min (ref >60)
GFR calc non Af Amer: >60 mL/min (ref >60)
Glucose, Bld: 84 mg/dL (ref 65–99)
Potassium: 3.6 mmol/L (ref 3.5–5.1)
Sodium: 136 mmol/L (ref 135–145)

## 2017-09-15 LAB — CBC
HCT: 32.2 % — ABNORMAL LOW (ref 36.0–46.0)
Hemoglobin: 10.6 g/dL — ABNORMAL LOW (ref 12.0–15.0)
MCH: 31.5 pg (ref 26.0–34.0)
MCHC: 32.9 g/dL (ref 30.0–36.0)
MCV: 95.8 fL (ref 78.0–100.0)
Platelets: 26 10*3/uL — CL (ref 150–400)
RBC: 3.36 MIL/uL — ABNORMAL LOW (ref 3.87–5.11)
RDW: 16.6 % — ABNORMAL HIGH (ref 11.5–15.5)
WBC: 3.2 10*3/uL — ABNORMAL LOW (ref 4.0–10.5)

## 2017-09-15 MED ORDER — PENTOXIFYLLINE ER 400 MG PO TBCR: 400.0000 mg | EXTENDED_RELEASE_TABLET | Freq: Three times a day (TID) | ORAL | 0 refills | Status: DC

## 2017-09-15 MED ORDER — HALOPERIDOL 2 MG PO TABS: 2.0000 mg | ORAL_TABLET | Freq: Every day | ORAL | 0 refills | Status: DC

## 2017-09-15 MED ORDER — LORAZEPAM 1 MG PO TABS
1.0000 mg | ORAL_TABLET | Freq: Four times a day (QID) | ORAL | Status: DC | PRN
  Administered 2017-09-15 – 2017-09-16 (×2): 1 mg via ORAL
  Filled 2017-09-15 (×2): qty 1

## 2017-09-15 MED ORDER — TOPIRAMATE 50 MG PO TABS: 50.0000 mg | ORAL_TABLET | Freq: Every day | ORAL | 0 refills | Status: DC

## 2017-09-15 MED ORDER — PHENYTOIN SODIUM EXTENDED 100 MG PO CAPS: 300.0000 mg | ORAL_CAPSULE | Freq: Every day | ORAL | 0 refills | Status: DC

## 2017-09-15 MED ORDER — CHLORDIAZEPOXIDE HCL 5 MG PO CAPS
25.0000 mg | ORAL_CAPSULE | Freq: Two times a day (BID) | ORAL | Status: DC
  Administered 2017-09-15 – 2017-09-16 (×2): 25 mg via ORAL
  Filled 2017-09-15 (×2): qty 5

## 2017-09-15 MED ORDER — BUSPIRONE HCL 7.5 MG PO TABS: 7.5000 mg | ORAL_TABLET | Freq: Two times a day (BID) | ORAL | 0 refills | Status: DC

## 2017-09-15 MED ORDER — LORAZEPAM 2 MG/ML IJ SOLN
1.0000 mg | Freq: Four times a day (QID) | INTRAMUSCULAR | Status: DC | PRN
  Administered 2017-09-15 – 2017-09-16 (×2): 1 mg via INTRAVENOUS
  Filled 2017-09-15 (×2): qty 1

## 2017-09-15 MED ORDER — CLOTRIMAZOLE 1 % VA CREA
1.0000 | TOPICAL_CREAM | Freq: Every day | VAGINAL | Status: DC
  Administered 2017-09-15: 1 via VAGINAL
  Filled 2017-09-15: qty 45

## 2017-09-15 MED FILL — TOPIRAMATE 50 MG TABLET: 50 | 30 days supply | Qty: 30 | Fill #0

## 2017-09-15 MED FILL — HALOPERIDOL 2 MG TABLET: 2 | 30 days supply | Qty: 30 | Fill #0

## 2017-09-15 NOTE — Progress Notes (Signed)
Pt c/o vaginal itching and yeast infection. MD notified and new order received. Will continue to closely monitor pt. Delia Heady RN

## 2017-09-15 NOTE — Progress Notes (Signed)
Pt interested in going to inpatient substance rehab. She had contacted [facility] and [facility]. [facility] will not accept her having a history of withdrawal seizures. [facility] was faxed information they requested (release of information signed). They reviewed her information and also denied the patient stating she is too medically complex.  Pt and Dr Tana [place] updated. Pt plans on going to stay with a different friend she feels wont encourage her to drink and will be a good support.  Plan is for d/c in am. MD adjusting her medications tonight. CM following and will have pt f/u with [facility] for her d/c medications.

## 2017-09-15 NOTE — Progress Notes (Signed)
[facility]                                                                              Patient Demographics  Briana Sweeney, is a 47 y.o. female, DOB - [birth year], GGY:[id number]  Admit date - 09/11/2017   Admitting Physician Karmen Bongo, MD  Outpatient Primary MD for the patient is Patient, No Pcp Per  Outpatient specialists:   LOS - 4  days   Medical records reviewed and are as summarized below:    Chief Complaint  Patient presents with  . Seizures       Brief summary   Patient is a 47 year old female with history of seizures, schizophrenia/bipolar disorder, hepatitis C, GERD, depression, alcoholic cirrhosis presented with emesis, alcohol withdrawals.  Patient was recently hospitalized 12/23-27 for hematemesis associated with esophageal varices.She reported resumption of drinking after discharge but in the last few days she had a seizure, hallucinations, emesis, and has felt very dizzy.  Last drink a day before the admission, drinks beer and vodka.  History of alcohol withdrawal seizures, has been drinking since she was 44-61 years old.   Assessment & Plan    Principal Problem:   Alcohol withdrawal ([place]) - Alcohol level 42 on admit, continue CIWA with Ativan, thiamine, folate, MVI - Patient interested in rehab for alcohol, also states that she has not been taking her psych medications and having schizophrenia symptoms/hallucinations. - Psychiatry was consulted, medications adjusted - continue CIWA with Ativan - patient interested in residential alcohol program at [facility], [place], social work and case management consulted, awaiting further recommendations for disposition  Active Problems: History of schizophrenia, bipolar disease -Psychiatry was consulted for medications, restarted Topamax 50 mg daily, Haldol 2 mg at bedtime, BuSpar 7.5 mg twice a day as per psychiatry recommendations.    Hepatitis C, chronic ([place]),  Alcoholic cirrhosis of liver with  ascites (HCC) -LFTs elevated likely related to hep C and alcoholism, continues to drink  Thrombocytopenia -Likely due to alcoholic liver disease,follow counts  Liver cirrhosis -Currently compensated, continue Aldactone -BP soft, Lasix on hold  Code Status: Full code  DVT Prophylaxis:  SCD's Family Communication: Discussed in detail with the patient, all imaging results, lab results explained to the patient   Disposition Plan: Awaiting recommendations from social work and case management regarding residential alcohol rehab program  Time Spent in minutes  15 minutes  Procedures:    Consultants:   Psychiatry  Antimicrobials:      Medications  Scheduled Meds: . busPIRone  7.5 mg Oral BID  . chlorhexidine  15 mL Mouth/Throat BID  . docusate sodium  100 mg Oral BID  . feeding supplement  1 Container Oral TID BM  . feeding supplement (PRO-STAT SUGAR FREE 64)  30 mL Oral Daily  . folic acid  1 mg Oral Daily  . haloperidol  2 mg Oral QHS  . LORazepam  0-4 mg Intravenous Q12H  . multivitamin with minerals  1 tablet Oral Daily  . pantoprazole  40 mg Oral Daily  . pentoxifylline  400 mg Oral TID WC  . phenytoin  300 mg Oral QHS  . spironolactone  50  mg Oral Daily  . thiamine  100 mg Oral Daily  . topiramate  50 mg Oral Daily   Continuous Infusions:  PRN Meds:.acetaminophen **OR** acetaminophen, diphenhydrAMINE, ondansetron **OR** ondansetron (ZOFRAN) IV   Antibiotics   Anti-infectives (From admission, onward)   None        Subjective:   Briana Sweeney was seen and examined today.  Feels a lot better, itching is improved, wants to go to residential alcohol rehab place.     Patient denies dizziness, chest pain, shortness of breath, abdominal pain, N/V/D/C, new weakness.    Objective:   Vitals:   09/14/17 1400 09/14/17 2131 09/15/17 0000 09/15/17 0515  BP: 106/73 119/82 106/76 99/70  Pulse: (!) 103 91 (!) 107 91  Resp: 17 18 20 18   Temp: 98.3 F (36.8 C) 98.2  F (36.8 C) 98 F (36.7 C) 98.2 F (36.8 C)  TempSrc: Oral Oral Oral Oral  SpO2: 100% 100% 100% 100%  Weight:      Height:        Intake/Output Summary (Last 24 hours) at 09/15/2017 1248 Last data filed at 09/14/2017 1300 Gross per 24 hour  Intake 360 ml  Output -  Net 360 ml     Wt Readings from Last 3 Encounters:  09/11/17 58.1 kg (128 lb 1.4 oz)  09/03/17 58.3 kg (128 lb 8.4 oz)  07/28/17 62.2 kg (137 lb 3.2 oz)     Exam    General: Alert and oriented x 3, NAD, slightly anxious  Eyes:   HEENT:    Cardiovascular: S1 S2 clear, RRR. No pedal edema b/l  Respiratory: Clear to auscultation bilaterally, no wheezing, rales or rhonchi  Gastrointestinal: Soft, nontender, nondistended, + bowel sounds  Ext: no pedal edema bilaterally  Neuro: no neuro deficits  Musculoskeletal: No digital cyanosis, clubbing  Skin: No rashes  Psych: Normal affect and demeanor, alert and oriented x3     Data Reviewed:  I have personally reviewed following labs and imaging studies  Micro Results No results found for this or any previous visit (from the past 240 hour(s)).  Radiology Reports No results found.  Lab Data:  CBC: Recent Labs  Lab 09/11/17 1024 09/11/17 1315 09/12/17 0258 09/13/17 0358 09/14/17 0323 09/15/17 0432  WBC 2.5*  --  2.2* 2.5* 2.8* 3.2*  HGB 11.1*  --  9.8* 10.4* 10.2* 10.6*  HCT 33.4*  --  30.6* 32.4* 32.3* 32.2*  MCV 96.0  --  94.4 97.0 95.8 95.8  PLT PLATELET CLUMPS NOTED ON SMEAR, COUNT APPEARS DECREASED 21* 25* PLATELET CLUMPS NOTED ON SMEAR, COUNT APPEARS DECREASED 27* PENDING   Basic Metabolic Panel: Recent Labs  Lab 09/11/17 1024 09/11/17 1315 09/12/17 0258 09/13/17 0358 09/14/17 0323 09/15/17 0432  NA 136  --  135 139 135 136  K 3.8  --  3.4* 3.8 3.3* 3.6  CL 104  --  105 111 110 110  CO2 22  --  23 21* 18* 18*  GLUCOSE 89  --  83 114* 150* 84  BUN <5*  --  <5* <5* <5* <5*  CREATININE 0.52  --  0.52 0.60 0.66 0.55  CALCIUM  8.7*  --  8.5* 9.3 8.8* 8.6*  MG  --  1.6*  --   --   --   --    GFR: Estimated Creatinine Clearance: 70.8 mL/min (by C-G formula based on SCr of 0.55 mg/dL). Liver Function Tests: Recent Labs  Lab 09/11/17 1315  AST 95*  ALT 25  ALKPHOS 123  BILITOT 1.9*  PROT 8.6*  ALBUMIN 3.0*   Recent Labs  Lab 09/11/17 1315  LIPASE 93*   Recent Labs  Lab 09/11/17 1509  AMMONIA 63*   Coagulation Profile: Recent Labs  Lab 09/11/17 1352  INR 1.19   Cardiac Enzymes: No results for input(s): CKTOTAL, CKMB, CKMBINDEX, TROPONINI in the last 168 hours. BNP (last 3 results) No results for input(s): PROBNP in the last 8760 hours. HbA1C: No results for input(s): HGBA1C in the last 72 hours. CBG: Recent Labs  Lab 09/11/17 1252  GLUCAP 75   Lipid Profile: No results for input(s): CHOL, HDL, LDLCALC, TRIG, CHOLHDL, LDLDIRECT in the last 72 hours. Thyroid Function Tests: No results for input(s): TSH, T4TOTAL, FREET4, T3FREE, THYROIDAB in the last 72 hours. Anemia Panel: No results for input(s): VITAMINB12, FOLATE, FERRITIN, TIBC, IRON, RETICCTPCT in the last 72 hours. Urine analysis:    Component Value Date/Time   COLORURINE YELLOW 09/11/2017 [place] 09/11/2017 1337   LABSPEC 1.010 09/11/2017 1337   PHURINE 9.0 (H) 09/11/2017 1337   GLUCOSEU NEGATIVE 09/11/2017 1337   HGBUR NEGATIVE 09/11/2017 1337   BILIRUBINUR NEGATIVE 09/11/2017 1337   KETONESUR NEGATIVE 09/11/2017 1337   PROTEINUR NEGATIVE 09/11/2017 1337   NITRITE NEGATIVE 09/11/2017 1337   LEUKOCYTESUR NEGATIVE 09/11/2017 1337     Ripudeep Rai M.D. [facility] 09/15/2017, 12:48 PM  Pager: [phone] Between 7am to 7pm - call Pager - [phone]  After 7pm go to [email or web address] - password [email or web address]  Call night coverage person covering after 7pm

## 2017-09-16 MED ORDER — PHENYTOIN SODIUM EXTENDED 100 MG PO CAPS: 300.0000 mg | ORAL_CAPSULE | Freq: Every day | ORAL | 0 refills | Status: DC

## 2017-09-16 MED ORDER — CHLORDIAZEPOXIDE HCL 25 MG PO CAPS: ORAL_CAPSULE | ORAL | 0 refills | Status: DC

## 2017-09-16 MED ORDER — HALOPERIDOL 2 MG PO TABS: 2.0000 mg | ORAL_TABLET | Freq: Every day | ORAL | 0 refills | Status: DC

## 2017-09-16 MED ORDER — THIAMINE HCL 100 MG PO TABS: 100.0000 mg | ORAL_TABLET | Freq: Every day | ORAL | 1 refills | Status: DC

## 2017-09-16 MED ORDER — TOPIRAMATE 50 MG PO TABS: 50.0000 mg | ORAL_TABLET | Freq: Every day | ORAL | 0 refills | Status: DC

## 2017-09-16 MED ORDER — CLOTRIMAZOLE 1 % VA CREA: 1.0000 | TOPICAL_CREAM | Freq: Every day | VAGINAL | 0 refills | Status: DC

## 2017-09-16 MED ORDER — FUROSEMIDE 20 MG PO TABS: 20.0000 mg | ORAL_TABLET | Freq: Every day | ORAL | 2 refills | Status: DC

## 2017-09-16 MED ORDER — BUSPIRONE HCL 7.5 MG PO TABS: 7.5000 mg | ORAL_TABLET | Freq: Two times a day (BID) | ORAL | 0 refills | Status: DC

## 2017-09-16 MED ORDER — SPIRONOLACTONE 50 MG PO TABS: 50.0000 mg | ORAL_TABLET | Freq: Every day | ORAL | 2 refills | Status: DC

## 2017-09-16 MED ORDER — PENTOXIFYLLINE ER 400 MG PO TBCR: 400.0000 mg | EXTENDED_RELEASE_TABLET | Freq: Three times a day (TID) | ORAL | 0 refills | Status: DC

## 2017-09-16 MED ORDER — PANTOPRAZOLE SODIUM 40 MG PO TBEC: 40.0000 mg | DELAYED_RELEASE_TABLET | Freq: Every day | ORAL | 2 refills | Status: DC

## 2017-09-16 MED FILL — PHENYTOIN SOD EXT 100 MG CA: 100 | 30 days supply | Qty: 90 | Fill #0

## 2017-09-16 MED FILL — PENTOXIFYLLINE 400 MG TAB S: 400 | 30 days supply | Qty: 90 | Fill #0

## 2017-09-16 MED FILL — busPIRone HCL 7.5 MG TABS: 7.5 | 30 days supply | Qty: 60 | Fill #0

## 2017-09-16 NOTE — Progress Notes (Signed)
Pt discharging to a friends home. Pt unable to afford the co pays to her medications. Buspar, Phenytoin and Trental E sent to [facility] pharmacy yesterday per MD so they could be ordered and ready for patients d/c today. CM reached out to [facility] pharmacy and they did not get the medications in today.  CM had spoken to [facility] yesterday and they do have the medications in stock. Prescriptions for the 3 medications sent to [place] and CSW department will cover the copays for the medications.  The rest of the prescriptions faxed to [facility] pharmacy and they will assist with the cost of those medications. [employer] is going to provide transportation for the patient to [place] and then to [facility] pharmacy. She will then use a cab voucher from [place] to get to her friends house in [place].  Pt has f/u appointment at [facility] for a PCP and she knows to use the [facility] pharmacy for medication assistance.

## 2017-09-16 NOTE — Discharge Summary (Signed)
Physician Discharge Summary   Patient ID: Briana Sweeney MRN: [id number] DOB/AGE: 1971/09/07 47 y.o.  Admit date: 09/11/2017 Discharge date: 09/16/2017  Primary Care Physician:  Patient, No Pcp Per  Discharge Diagnoses:    . Acute alcohol withdrawal ([place]) . Alcoholic cirrhosis of liver with ascites ([place]) . Hepatitis C, chronic (HCC) History of schizophrenia, bipolar disease Alcoholic cirrhosis of liver Thrombocytopenia Vaginitis/yeast infection  Consults: Psychiatry  Recommendations for Outpatient Follow-up:  1. Please repeat CBC/BMET at next visit  DIET: Low-sodium diet    Allergies:  No Known Allergies   DISCHARGE MEDICATIONS: Allergies as of 09/16/2017   No Known Allergies     Medication List    TAKE these medications   busPIRone 7.5 MG tablet Commonly known as:  BUSPAR Take 1 tablet (7.5 mg total) by mouth 2 (two) times daily. What changed:    medication strength  how much to take  when to take this   chlordiazePOXIDE 25 MG capsule Commonly known as:  LIBRIUM Take 1 tab twice a day for 3 days, then 1 tab daily for 3 days, then OFF   clotrimazole 1 % vaginal cream Commonly known as:  GYNE-LOTRIMIN Place 1 Applicatorful vaginally at bedtime.   furosemide 20 MG tablet Commonly known as:  LASIX Take 1 tablet (20 mg total) by mouth daily.   haloperidol 2 MG tablet Commonly known as:  HALDOL Take 1 tablet (2 mg total) by mouth at bedtime.   pantoprazole 40 MG tablet Commonly known as:  PROTONIX Take 1 tablet (40 mg total) by mouth daily.   pentoxifylline 400 MG CR tablet Commonly known as:  TRENTAL Take 1 tablet (400 mg total) by mouth 3 (three) times daily with meals.   phenytoin 100 MG ER capsule Commonly known as:  DILANTIN Take 3 capsules (300 mg total) by mouth at bedtime.   spironolactone 50 MG tablet Commonly known as:  ALDACTONE Take 1 tablet (50 mg total) by mouth daily.   thiamine 100 MG tablet Take 1 tablet (100 mg total) by mouth  daily. Start taking on:  09/17/2017   topiramate 50 MG tablet Commonly known as:  TOPAMAX Take 1 tablet (50 mg total) by mouth daily.        Brief H and P: For complete details please refer to admission H and P, but in brief Patient is a 47 year old female with history of seizures, schizophrenia/bipolar disorder, hepatitis C, GERD, depression, alcoholic cirrhosis presented with emesis, alcohol withdrawals.  Patient was recently hospitalized 12/23-27 for hematemesis associated with esophageal varices.She reported resumption of drinking after discharge but in the last few days shehad a seizure, hallucinations, emesis,and has feltvery dizzy.  Last drink a day before the admission, drinks beer and vodka.  History of alcohol withdrawal seizures, has been drinking since she was 31-13 years old.  Hospital Course:   Acute alcohol withdrawal ([place]) - Alcohol level 42 on admit, patient was placed on CIWA with Ativan, thiamine, folate, MVI - Patient was interested in rehab for alcohol, also states that she has not been taking her psych medications and was having schizophrenia symptoms/hallucinations, hence psychiatry was consulted. . -Patient was interested in inpatient substance rehab, contacted [facility] and [facility]. [facility] did not accept her due to history of seizures, [facility] reviewed her information are also denied the patient stating that she has other medical issues -Patient is now out of alcohol withdrawals, placed on Librium 25 mg twice a day for 3 days, then taper to 25 mg daily for 3 days  then off.  She will stay with another friend who will be a better support for the patient to stay off alcohol.   History of schizophrenia, bipolar disease -Psychiatry was consulted for medications, restarted Topamax 50 mg daily, Haldol 2 mg at bedtime, BuSpar 7.5 mg twice a day as per psychiatry recommendations.    Hepatitis C, chronic ([place]),  Alcoholic cirrhosis of liver with ascites (HCC) -LFTs elevated  likely related to hep C and alcoholism, continues to drink, counseled strongly for alcohol cessation  Thrombocytopenia -Likely due to alcoholic liver disease,follow counts  Liver cirrhosis -Currently compensated, continue Aldactone and Lasix    Day of Discharge BP 109/81 (BP Location: Right Arm)   Pulse 94   Temp 97.6 F (36.4 C) (Oral)   Resp 20   Ht 5' (1.524 m)   Wt 58.1 kg (128 lb 1.4 oz)   LMP  (LMP Unknown)   SpO2 100%   BMI 25.02 kg/m   Physical Exam: General: Alert and awake oriented x3 not in any acute distress. HEENT: anicteric sclera, pupils reactive to light and accommodation CVS: S1-S2 clear no murmur rubs or gallops Chest: clear to auscultation bilaterally, no wheezing rales or rhonchi Abdomen: soft nontender, nondistended, normal bowel sounds Extremities: no cyanosis, clubbing or edema noted bilaterally Neuro: Cranial nerves II-XII intact, no focal neurological deficits   The results of significant diagnostics from this hospitalization (including imaging, microbiology, ancillary and laboratory) are listed below for reference.    LAB RESULTS: Basic Metabolic Panel: Recent Labs  Lab 09/11/17 1315  09/14/17 0323 09/15/17 0432  NA  --    < > 135 136  K  --    < > 3.3* 3.6  CL  --    < > 110 110  CO2  --    < > 18* 18*  GLUCOSE  --    < > 150* 84  BUN  --    < > <5* <5*  CREATININE  --    < > 0.66 0.55  CALCIUM  --    < > 8.8* 8.6*  MG 1.6*  --   --   --    < > = values in this interval not displayed.   Liver Function Tests: Recent Labs  Lab 09/11/17 1315  AST 95*  ALT 25  ALKPHOS 123  BILITOT 1.9*  PROT 8.6*  ALBUMIN 3.0*   Recent Labs  Lab 09/11/17 1315  LIPASE 93*   Recent Labs  Lab 09/11/17 1509  AMMONIA 63*   CBC: Recent Labs  Lab 09/14/17 0323 09/15/17 0432  WBC 2.8* 3.2*  HGB 10.2* 10.6*  HCT 32.3* 32.2*  MCV 95.8 95.8  PLT 27* 26*   Cardiac Enzymes: No results for input(s): CKTOTAL, CKMB, CKMBINDEX, TROPONINI in  the last 168 hours. BNP: Invalid input(s): POCBNP CBG: Recent Labs  Lab 09/11/17 1252  GLUCAP 75    Significant Diagnostic Studies:  No results found.  2D ECHO:   Disposition and Follow-up: Discharge Instructions    Diet - low sodium heart healthy   Complete by:  As directed    Increase activity slowly   Complete by:  As directed        DISPOSITION: home    [facility] Follow up on 09/23/2017.   Why:  Your appointment time is 1 pm. Please arrive 15 min early and bring a picture ID and your current medications. Contact information: [address]  [address] [place] [place], [place] [address]  [phone]           Time spent on Discharge: 35 mins   Signed:   Estill Cotta M.D. [facility] 09/16/2017, 11:07 AM Pager: [phone]

## 2017-09-16 NOTE — Progress Notes (Signed)
Patient left the unit accompanied by security for discharge home

## 2017-09-23 ENCOUNTER — Emergency Department (HOSPITAL_COMMUNITY)
Admission: EM | Admit: 2017-09-23 | Discharge: 2017-09-23 | Disposition: A | Discharge status: Home / Self Care | Payer: Self-pay | Attending: Emergency Medicine | Admitting: Emergency Medicine

## 2017-09-23 ENCOUNTER — Ambulatory Visit: Payer: Self-pay | Admitting: Family Medicine

## 2017-09-23 ENCOUNTER — Encounter (HOSPITAL_COMMUNITY): Payer: Self-pay | Admitting: Emergency Medicine

## 2017-09-23 DIAGNOSIS — K7460 Unspecified cirrhosis of liver: Secondary | ICD-10-CM | POA: Insufficient documentation

## 2017-09-23 DIAGNOSIS — R569 Unspecified convulsions: Secondary | ICD-10-CM | POA: Insufficient documentation

## 2017-09-23 DIAGNOSIS — F1010 Alcohol abuse, uncomplicated: Secondary | ICD-10-CM | POA: Insufficient documentation

## 2017-09-23 LAB — RAPID URINE DRUG SCREEN, HOSP PERFORMED
Amphetamines: NOT DETECTED
Barbiturates: NOT DETECTED
Benzodiazepines: POSITIVE — AB
Cocaine: POSITIVE — AB
Opiates: NOT DETECTED
Tetrahydrocannabinol: NOT DETECTED

## 2017-09-23 LAB — CBC
HCT: 36.5 % (ref 36.0–46.0)
Hemoglobin: 12 g/dL (ref 12.0–15.0)
MCH: 30.5 pg (ref 26.0–34.0)
MCHC: 32.9 g/dL (ref 30.0–36.0)
MCV: 92.9 fL (ref 78.0–100.0)
Platelets: 36 10*3/uL — ABNORMAL LOW (ref 150–400)
RBC: 3.93 MIL/uL (ref 3.87–5.11)
RDW: 16.1 % — ABNORMAL HIGH (ref 11.5–15.5)
WBC: 4.4 10*3/uL (ref 4.0–10.5)

## 2017-09-23 LAB — PROTIME-INR
INR: 1.2
Prothrombin Time: 15.1 seconds (ref 11.4–15.2)

## 2017-09-23 LAB — COMPREHENSIVE METABOLIC PANEL
ALT: 21 U/L (ref 14–54)
AST: 56 U/L — ABNORMAL HIGH (ref 15–41)
Albumin: 3.1 g/dL — ABNORMAL LOW (ref 3.5–5.0)
Alkaline Phosphatase: 144 U/L — ABNORMAL HIGH (ref 38–126)
Anion gap: 12 (ref 5–15)
BUN: <5 mg/dL — ABNORMAL LOW (ref 6–20)
CO2: 21 mmol/L — ABNORMAL LOW (ref 22–32)
Calcium: 8.7 mg/dL — ABNORMAL LOW (ref 8.9–10.3)
Chloride: 108 mmol/L (ref 101–111)
Creatinine, Ser: 0.55 mg/dL (ref 0.44–1.00)
GFR calc Af Amer: >60 mL/min (ref >60)
GFR calc non Af Amer: >60 mL/min (ref >60)
Glucose, Bld: 94 mg/dL (ref 65–99)
Potassium: 3 mmol/L — ABNORMAL LOW (ref 3.5–5.1)
Sodium: 141 mmol/L (ref 135–145)
Total Bilirubin: 1.4 mg/dL — ABNORMAL HIGH (ref 0.3–1.2)
Total Protein: 8.9 g/dL — ABNORMAL HIGH (ref 6.5–8.1)

## 2017-09-23 LAB — LIPASE, BLOOD: Lipase: 72 U/L — ABNORMAL HIGH (ref 11–51)

## 2017-09-23 LAB — POC URINE PREG, ED: Preg Test, Ur: NEGATIVE

## 2017-09-23 LAB — ACETAMINOPHEN LEVEL: Acetaminophen (Tylenol), Serum: <10 ug/mL — ABNORMAL LOW (ref 10–30)

## 2017-09-23 LAB — ETHANOL: Alcohol, Ethyl (B): 267 mg/dL — ABNORMAL HIGH (ref <10)

## 2017-09-23 LAB — PHENYTOIN LEVEL, TOTAL: Phenytoin Lvl: <2.5 ug/mL — ABNORMAL LOW (ref 10.0–20.0)

## 2017-09-23 LAB — SALICYLATE LEVEL: Salicylate Lvl: <7 mg/dL (ref 2.8–30.0)

## 2017-09-23 MED ORDER — TOPIRAMATE 25 MG PO TABS
25.0000 mg | ORAL_TABLET | Freq: Once | ORAL | Status: AC
  Administered 2017-09-23: 25 mg via ORAL
  Filled 2017-09-23: qty 1

## 2017-09-23 MED ORDER — LORAZEPAM 2 MG/ML IJ SOLN
2.0000 mg | Freq: Once | INTRAMUSCULAR | Status: AC
  Administered 2017-09-23: 2 mg via INTRAVENOUS
  Filled 2017-09-23: qty 1

## 2017-09-23 MED ORDER — ONDANSETRON HCL 4 MG/2ML IJ SOLN
4.0000 mg | Freq: Once | INTRAMUSCULAR | Status: AC
  Administered 2017-09-23: 4 mg via INTRAVENOUS
  Filled 2017-09-23: qty 2

## 2017-09-23 MED ORDER — TOPIRAMATE 25 MG PO TABS: 50.0000 mg | ORAL_TABLET | Freq: Once | ORAL | Status: DC

## 2017-09-23 MED ORDER — ONDANSETRON HCL 4 MG/2ML IJ SOLN
4.0000 mg | Freq: Once | INTRAMUSCULAR | Status: DC
  Filled 2017-09-23: qty 2

## 2017-09-23 MED ORDER — PHENYTOIN SODIUM EXTENDED 100 MG PO CAPS
300.0000 mg | ORAL_CAPSULE | Freq: Once | ORAL | Status: AC
  Administered 2017-09-23: 300 mg via ORAL
  Filled 2017-09-23: qty 3

## 2017-09-23 MED ORDER — PANTOPRAZOLE SODIUM 40 MG IV SOLR
40.0000 mg | Freq: Once | INTRAVENOUS | Status: AC
  Administered 2017-09-23: 40 mg via INTRAVENOUS
  Filled 2017-09-23: qty 40

## 2017-09-23 MED ORDER — THIAMINE HCL 100 MG/ML IJ SOLN
Freq: Once | INTRAVENOUS | Status: AC
  Administered 2017-09-23: 17:00:00 via INTRAVENOUS
  Filled 2017-09-23: qty 1000
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
## 2017-09-23 NOTE — ED Notes (Signed)
Patient given discharge instructions and verbalized understanding.  Patient stable to discharge at this time.  Patient is alert and oriented to baseline.  No distressed noted at this time.  All belongings taken with the patient at discharge.   

## 2017-09-23 NOTE — Care Management Note (Signed)
Case Management Note  CM contacted for medication assistance for pt with no ins and help with rescheduling a missed appointment today.  CM advised [place], PA that pt needs to call to reschedule the appointment herself tomorrow and that she can go to the [facility] before 6 pm today for assistance with her medications as noted from 09/16/2017.  They have all of the medications on file.  If pt is not D/C before 6 pm then she will need to go to them tomorrow.  All information placed on AVS including information for financial counseling.  CM has been unable to log into [facility] to confirm if pt was MATCHed on 09/16/2017 or within the last year.  No further CM needs noted at this time.
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
## 2017-09-23 NOTE — ED Notes (Signed)
Got patient hooked up to the monitor patient is resting with call bell in reach 

## 2017-09-23 NOTE — Discharge Instructions (Signed)
Please call the [facility] to reschedule your appointment Please take your medicines as prescribed Return if worsening

## 2017-10-08 ENCOUNTER — Encounter (HOSPITAL_COMMUNITY): Payer: Self-pay

## 2017-10-08 ENCOUNTER — Emergency Department (HOSPITAL_COMMUNITY): Payer: Self-pay

## 2017-10-08 ENCOUNTER — Other Ambulatory Visit: Payer: Self-pay

## 2017-10-08 ENCOUNTER — Ambulatory Visit: Payer: Self-pay | Admitting: Family Medicine

## 2017-10-08 ENCOUNTER — Inpatient Hospital Stay (HOSPITAL_COMMUNITY)
Admission: EM | Admit: 2017-10-08 | Discharge: 2017-10-14 | DRG: 897 | Disposition: A | Discharge status: Home / Self Care | Payer: Medicaid Other | Attending: Internal Medicine | Admitting: Internal Medicine

## 2017-10-08 DIAGNOSIS — K7031 Alcoholic cirrhosis of liver with ascites: Secondary | ICD-10-CM | POA: Diagnosis present

## 2017-10-08 DIAGNOSIS — K219 Gastro-esophageal reflux disease without esophagitis: Secondary | ICD-10-CM | POA: Diagnosis present

## 2017-10-08 DIAGNOSIS — N898 Other specified noninflammatory disorders of vagina: Secondary | ICD-10-CM | POA: Diagnosis not present

## 2017-10-08 DIAGNOSIS — F319 Bipolar disorder, unspecified: Secondary | ICD-10-CM | POA: Diagnosis present

## 2017-10-08 DIAGNOSIS — F419 Anxiety disorder, unspecified: Secondary | ICD-10-CM | POA: Diagnosis present

## 2017-10-08 DIAGNOSIS — F10231 Alcohol dependence with withdrawal delirium: Principal | ICD-10-CM | POA: Diagnosis present

## 2017-10-08 DIAGNOSIS — E876 Hypokalemia: Secondary | ICD-10-CM | POA: Diagnosis present

## 2017-10-08 DIAGNOSIS — Z818 Family history of other mental and behavioral disorders: Secondary | ICD-10-CM

## 2017-10-08 DIAGNOSIS — R3915 Urgency of urination: Secondary | ICD-10-CM | POA: Diagnosis present

## 2017-10-08 DIAGNOSIS — M545 Low back pain: Secondary | ICD-10-CM | POA: Diagnosis not present

## 2017-10-08 DIAGNOSIS — Z87442 Personal history of urinary calculi: Secondary | ICD-10-CM

## 2017-10-08 DIAGNOSIS — F10931 Alcohol use, unspecified with withdrawal delirium: Secondary | ICD-10-CM | POA: Diagnosis present

## 2017-10-08 DIAGNOSIS — F10239 Alcohol dependence with withdrawal, unspecified: Secondary | ICD-10-CM | POA: Diagnosis present

## 2017-10-08 DIAGNOSIS — F209 Schizophrenia, unspecified: Secondary | ICD-10-CM | POA: Diagnosis present

## 2017-10-08 DIAGNOSIS — M549 Dorsalgia, unspecified: Secondary | ICD-10-CM

## 2017-10-08 DIAGNOSIS — F10939 Alcohol use, unspecified with withdrawal, unspecified: Secondary | ICD-10-CM | POA: Diagnosis present

## 2017-10-08 DIAGNOSIS — Z79899 Other long term (current) drug therapy: Secondary | ICD-10-CM

## 2017-10-08 DIAGNOSIS — D61818 Other pancytopenia: Secondary | ICD-10-CM | POA: Diagnosis present

## 2017-10-08 DIAGNOSIS — D6959 Other secondary thrombocytopenia: Secondary | ICD-10-CM | POA: Diagnosis present

## 2017-10-08 DIAGNOSIS — R768 Other specified abnormal immunological findings in serum: Secondary | ICD-10-CM | POA: Diagnosis present

## 2017-10-08 DIAGNOSIS — K5900 Constipation, unspecified: Secondary | ICD-10-CM | POA: Diagnosis not present

## 2017-10-08 DIAGNOSIS — B182 Chronic viral hepatitis C: Secondary | ICD-10-CM | POA: Diagnosis present

## 2017-10-08 LAB — CBC
HCT: 35.9 % — ABNORMAL LOW (ref 36.0–46.0)
Hemoglobin: 11.5 g/dL — ABNORMAL LOW (ref 12.0–15.0)
MCH: 29.4 pg (ref 26.0–34.0)
MCHC: 32 g/dL (ref 30.0–36.0)
MCV: 91.8 fL (ref 78.0–100.0)
RBC: 3.91 MIL/uL (ref 3.87–5.11)
RDW: 17.4 % — ABNORMAL HIGH (ref 11.5–15.5)
WBC: 2.4 10*3/uL — ABNORMAL LOW (ref 4.0–10.5)

## 2017-10-08 LAB — COMPREHENSIVE METABOLIC PANEL
ALT: 19 U/L (ref 14–54)
AST: 58 U/L — ABNORMAL HIGH (ref 15–41)
Albumin: 3.2 g/dL — ABNORMAL LOW (ref 3.5–5.0)
Alkaline Phosphatase: 176 U/L — ABNORMAL HIGH (ref 38–126)
Anion gap: 13 (ref 5–15)
BUN: <5 mg/dL — ABNORMAL LOW (ref 6–20)
CO2: 19 mmol/L — ABNORMAL LOW (ref 22–32)
Calcium: 8.5 mg/dL — ABNORMAL LOW (ref 8.9–10.3)
Chloride: 107 mmol/L (ref 101–111)
Creatinine, Ser: 0.53 mg/dL (ref 0.44–1.00)
GFR calc Af Amer: >60 mL/min (ref >60)
GFR calc non Af Amer: >60 mL/min (ref >60)
Glucose, Bld: 82 mg/dL (ref 65–99)
Potassium: 3 mmol/L — ABNORMAL LOW (ref 3.5–5.1)
Sodium: 139 mmol/L (ref 135–145)
Total Bilirubin: 1.4 mg/dL — ABNORMAL HIGH (ref 0.3–1.2)
Total Protein: 8.9 g/dL — ABNORMAL HIGH (ref 6.5–8.1)

## 2017-10-08 LAB — I-STAT TROPONIN, ED: Troponin i, poc: 0 ng/mL (ref 0.00–0.08)

## 2017-10-08 LAB — RAPID URINE DRUG SCREEN, HOSP PERFORMED
Amphetamines: POSITIVE — AB
Barbiturates: NOT DETECTED
Benzodiazepines: NOT DETECTED
Cocaine: NOT DETECTED
Opiates: NOT DETECTED
Tetrahydrocannabinol: NOT DETECTED

## 2017-10-08 LAB — D-DIMER, QUANTITATIVE: D-Dimer, Quant: 2.15 ug/mL-FEU — ABNORMAL HIGH (ref 0.00–0.50)

## 2017-10-08 LAB — I-STAT BETA HCG BLOOD, ED (MC, WL, AP ONLY): I-stat hCG, quantitative: <5 m[IU]/mL (ref <5)

## 2017-10-08 LAB — ETHANOL: Alcohol, Ethyl (B): 219 mg/dL — ABNORMAL HIGH (ref <10)

## 2017-10-08 LAB — PLATELET COUNT: Platelets: 34 10*3/uL — ABNORMAL LOW (ref 150–400)

## 2017-10-08 MED ORDER — LORAZEPAM 1 MG PO TABS
0.0000 mg | ORAL_TABLET | Freq: Four times a day (QID) | ORAL | Status: DC
  Administered 2017-10-08: 2 mg via ORAL
  Filled 2017-10-08: qty 2

## 2017-10-08 MED ORDER — POTASSIUM CHLORIDE CRYS ER 20 MEQ PO TBCR
40.0000 meq | EXTENDED_RELEASE_TABLET | Freq: Once | ORAL | Status: DC
  Filled 2017-10-08: qty 2

## 2017-10-08 MED ORDER — LORAZEPAM 2 MG/ML IJ SOLN: 0.0000 mg | Freq: Two times a day (BID) | INTRAMUSCULAR | Status: DC

## 2017-10-08 MED ORDER — THIAMINE HCL 100 MG/ML IJ SOLN
100.0000 mg | Freq: Every day | INTRAMUSCULAR | Status: DC
  Administered 2017-10-08: 100 mg via INTRAVENOUS
  Filled 2017-10-08: qty 2

## 2017-10-08 MED ORDER — IOPAMIDOL (ISOVUE-370) INJECTION 76%
INTRAVENOUS | Status: AC
  Administered 2017-10-08: 100 mL
  Filled 2017-10-08: qty 100

## 2017-10-08 MED ORDER — LORAZEPAM 1 MG PO TABS: 0.0000 mg | ORAL_TABLET | Freq: Two times a day (BID) | ORAL | Status: DC

## 2017-10-08 MED ORDER — SODIUM CHLORIDE 0.9 % IV BOLUS (SEPSIS)
500.0000 mL | Freq: Once | INTRAVENOUS | Status: AC
Start: 2017-10-08 — End: 2017-10-08
  Administered 2017-10-08: 500 mL via INTRAVENOUS

## 2017-10-08 MED ORDER — VITAMIN B-1 100 MG PO TABS: 100.0000 mg | ORAL_TABLET | Freq: Every day | ORAL | Status: DC

## 2017-10-08 MED ORDER — LORAZEPAM 2 MG/ML IJ SOLN
0.0000 mg | Freq: Four times a day (QID) | INTRAMUSCULAR | Status: DC
  Administered 2017-10-08: 2 mg via INTRAVENOUS
  Filled 2017-10-08: qty 1

## 2017-10-08 MED FILL — busPIRone HCL 7.5 MG TABS: 7.5 | 30 days supply | Qty: 60 | Fill #0

## 2017-10-08 MED FILL — TOPIRAMATE 50 MG TABLET: 50 | 30 days supply | Qty: 30 | Fill #0

## 2017-10-08 MED FILL — ?FUROSEMIDE 20 MG TABLET: 20 | 30 days supply | Qty: 30 | Fill #1

## 2017-10-08 NOTE — ED Notes (Signed)
Patient transported to X-ray 
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
## 2017-10-08 NOTE — ED Notes (Signed)
Patient transported to CT 

## 2017-10-08 NOTE — ED Notes (Signed)
Pt returned from CT °

## 2017-10-08 NOTE — ED Triage Notes (Signed)
Pt arrives with multiple complaints. She reports she has neck and head pain she has from a fall several weeks ago that she was seen at [facility] for. Also endorses RUQ pain, bloody stool, dry skin, being cold, and auditory hallucinations. Pt states "At home I heard someone whispering 'get the fuck out of this house' and I look and no body is there."   Pt rambling in triage and sentences not making sense.   Pt also states "I have a liver appointment at 1:30 and I cant get there because I cant see and I dont know I cant even remember what I take and my blood platelets don't clot and ive been trying to eat properly but my nose just bleeds to death".   Denies SI/HI.

## 2017-10-09 ENCOUNTER — Encounter (HOSPITAL_COMMUNITY): Payer: Self-pay | Admitting: Family Medicine

## 2017-10-09 DIAGNOSIS — F10231 Alcohol dependence with withdrawal delirium: Principal | ICD-10-CM

## 2017-10-09 DIAGNOSIS — E876 Hypokalemia: Secondary | ICD-10-CM

## 2017-10-09 DIAGNOSIS — F10931 Alcohol use, unspecified with withdrawal delirium: Secondary | ICD-10-CM

## 2017-10-09 DIAGNOSIS — F209 Schizophrenia, unspecified: Secondary | ICD-10-CM | POA: Diagnosis present

## 2017-10-09 DIAGNOSIS — K7031 Alcoholic cirrhosis of liver with ascites: Secondary | ICD-10-CM | POA: Diagnosis not present

## 2017-10-09 DIAGNOSIS — D61818 Other pancytopenia: Secondary | ICD-10-CM

## 2017-10-09 DIAGNOSIS — B182 Chronic viral hepatitis C: Secondary | ICD-10-CM

## 2017-10-09 HISTORY — DX: Alcohol use, unspecified with withdrawal delirium: F10.931

## 2017-10-09 LAB — MAGNESIUM: Magnesium: 2 mg/dL (ref 1.7–2.4)

## 2017-10-09 LAB — MRSA PCR SCREENING: MRSA by PCR: NEGATIVE

## 2017-10-09 LAB — COMPREHENSIVE METABOLIC PANEL WITH GFR
ALT: 16 U/L (ref 14–54)
AST: 48 U/L — ABNORMAL HIGH (ref 15–41)
Albumin: 2.8 g/dL — ABNORMAL LOW (ref 3.5–5.0)
Alkaline Phosphatase: 156 U/L — ABNORMAL HIGH (ref 38–126)
Anion gap: 14 (ref 5–15)
BUN: <5 mg/dL — ABNORMAL LOW (ref 6–20)
CO2: 15 mmol/L — ABNORMAL LOW (ref 22–32)
Calcium: 8.4 mg/dL — ABNORMAL LOW (ref 8.9–10.3)
Chloride: 105 mmol/L (ref 101–111)
Creatinine, Ser: 0.55 mg/dL (ref 0.44–1.00)
GFR calc Af Amer: >60 mL/min
GFR calc non Af Amer: >60 mL/min
Glucose, Bld: 66 mg/dL (ref 65–99)
Potassium: 4 mmol/L (ref 3.5–5.1)
Sodium: 134 mmol/L — ABNORMAL LOW (ref 135–145)
Total Bilirubin: 1.9 mg/dL — ABNORMAL HIGH (ref 0.3–1.2)
Total Protein: 7.9 g/dL (ref 6.5–8.1)

## 2017-10-09 LAB — CBC
HCT: 31.7 % — ABNORMAL LOW (ref 36.0–46.0)
Hemoglobin: 10.3 g/dL — ABNORMAL LOW (ref 12.0–15.0)
MCH: 30 pg (ref 26.0–34.0)
MCHC: 32.5 g/dL (ref 30.0–36.0)
MCV: 92.4 fL (ref 78.0–100.0)
Platelets: 24 10*3/uL — CL (ref 150–400)
RBC: 3.43 MIL/uL — ABNORMAL LOW (ref 3.87–5.11)
RDW: 17.4 % — ABNORMAL HIGH (ref 11.5–15.5)
WBC: 3.6 10*3/uL — ABNORMAL LOW (ref 4.0–10.5)

## 2017-10-09 LAB — URINALYSIS, ROUTINE W REFLEX MICROSCOPIC
Bilirubin Urine: NEGATIVE
Glucose, UA: NEGATIVE mg/dL
Hgb urine dipstick: NEGATIVE
Ketones, ur: 80 mg/dL — AB
Leukocytes, UA: NEGATIVE
Nitrite: NEGATIVE
Protein, ur: NEGATIVE mg/dL
Specific Gravity, Urine: 1.016 (ref 1.005–1.030)
pH: 5 (ref 5.0–8.0)

## 2017-10-09 LAB — TYPE AND SCREEN
ABO/RH(D): O POS
Antibody Screen: NEGATIVE

## 2017-10-09 LAB — PHENYTOIN LEVEL, TOTAL: Phenytoin Lvl: <2.5 ug/mL — ABNORMAL LOW (ref 10.0–20.0)

## 2017-10-09 MED ORDER — ONDANSETRON HCL 4 MG/2ML IJ SOLN
4.0000 mg | Freq: Four times a day (QID) | INTRAMUSCULAR | Status: DC | PRN
  Administered 2017-10-09 (×2): 4 mg via INTRAVENOUS
  Filled 2017-10-09 (×2): qty 2

## 2017-10-09 MED ORDER — SODIUM CHLORIDE 0.9% FLUSH: 3.0000 mL | INTRAVENOUS | Status: DC | PRN

## 2017-10-09 MED ORDER — MAGNESIUM SULFATE IN D5W 1-5 GM/100ML-% IV SOLN
1.0000 g | Freq: Once | INTRAVENOUS | Status: AC
  Administered 2017-10-09: 1 g via INTRAVENOUS
  Filled 2017-10-09: qty 100

## 2017-10-09 MED ORDER — SODIUM CHLORIDE 0.9 % IV SOLN: 250.0000 mL | INTRAVENOUS | Status: DC | PRN

## 2017-10-09 MED ORDER — SODIUM CHLORIDE 0.9% FLUSH
3.0000 mL | Freq: Two times a day (BID) | INTRAVENOUS | Status: DC
  Administered 2017-10-09 – 2017-10-13 (×7): 3 mL via INTRAVENOUS

## 2017-10-09 MED ORDER — TOPIRAMATE 25 MG PO TABS
50.0000 mg | ORAL_TABLET | Freq: Every day | ORAL | Status: DC
  Administered 2017-10-09 – 2017-10-14 (×6): 50 mg via ORAL
  Filled 2017-10-09 (×7): qty 2

## 2017-10-09 MED ORDER — FOLIC ACID 1 MG PO TABS
1.0000 mg | ORAL_TABLET | Freq: Every day | ORAL | Status: DC
  Administered 2017-10-09 – 2017-10-14 (×6): 1 mg via ORAL
  Filled 2017-10-09 (×6): qty 1

## 2017-10-09 MED ORDER — PHENYTOIN SODIUM EXTENDED 100 MG PO CAPS
300.0000 mg | ORAL_CAPSULE | Freq: Every day | ORAL | Status: DC
  Administered 2017-10-09 – 2017-10-13 (×6): 300 mg via ORAL
  Filled 2017-10-09 (×6): qty 3

## 2017-10-09 MED ORDER — POTASSIUM CHLORIDE 10 MEQ/100ML IV SOLN
10.0000 meq | Freq: Once | INTRAVENOUS | Status: AC
  Administered 2017-10-09: 10 meq via INTRAVENOUS
  Filled 2017-10-09: qty 100

## 2017-10-09 MED ORDER — VITAMIN B-1 100 MG PO TABS
100.0000 mg | ORAL_TABLET | Freq: Every day | ORAL | Status: DC
  Administered 2017-10-09 – 2017-10-14 (×6): 100 mg via ORAL
  Filled 2017-10-09 (×6): qty 1

## 2017-10-09 MED ORDER — SODIUM CHLORIDE 0.9% FLUSH
3.0000 mL | Freq: Two times a day (BID) | INTRAVENOUS | Status: DC
  Administered 2017-10-09 – 2017-10-12 (×2): 3 mL via INTRAVENOUS

## 2017-10-09 MED ORDER — BUSPIRONE HCL 15 MG PO TABS
7.5000 mg | ORAL_TABLET | Freq: Two times a day (BID) | ORAL | Status: DC
  Administered 2017-10-09 – 2017-10-14 (×12): 7.5 mg via ORAL
  Filled 2017-10-09 (×12): qty 1

## 2017-10-09 MED ORDER — LORAZEPAM 2 MG/ML IJ SOLN
2.0000 mg | INTRAMUSCULAR | Status: DC | PRN
  Administered 2017-10-09 – 2017-10-14 (×23): 2 mg via INTRAVENOUS
  Filled 2017-10-09 (×24): qty 1

## 2017-10-09 MED ORDER — PANTOPRAZOLE SODIUM 40 MG PO TBEC
40.0000 mg | DELAYED_RELEASE_TABLET | Freq: Every day | ORAL | Status: DC
  Administered 2017-10-09 – 2017-10-14 (×6): 40 mg via ORAL
  Filled 2017-10-09 (×6): qty 1

## 2017-10-09 MED ORDER — ADULT MULTIVITAMIN W/MINERALS CH
1.0000 | ORAL_TABLET | Freq: Every day | ORAL | Status: DC
  Administered 2017-10-09 – 2017-10-14 (×6): 1 via ORAL
  Filled 2017-10-09 (×6): qty 1

## 2017-10-09 MED ORDER — HALOPERIDOL 1 MG PO TABS
2.0000 mg | ORAL_TABLET | Freq: Every day | ORAL | Status: DC
  Administered 2017-10-09 – 2017-10-13 (×6): 2 mg via ORAL
  Filled 2017-10-09: qty 1
  Filled 2017-10-09: qty 2
  Filled 2017-10-09 (×2): qty 1
  Filled 2017-10-09: qty 2
  Filled 2017-10-09: qty 1

## 2017-10-09 MED ORDER — ONDANSETRON HCL 4 MG PO TABS: 4.0000 mg | ORAL_TABLET | Freq: Four times a day (QID) | ORAL | Status: DC | PRN

## 2017-10-09 MED ORDER — ACETAMINOPHEN 650 MG RE SUPP: 650.0000 mg | Freq: Four times a day (QID) | RECTAL | Status: DC | PRN

## 2017-10-09 MED ORDER — LORAZEPAM 2 MG/ML IJ SOLN: 1.0000 mg | Freq: Once | INTRAMUSCULAR | Status: DC

## 2017-10-09 MED ORDER — ACETAMINOPHEN 325 MG PO TABS: 650.0000 mg | ORAL_TABLET | Freq: Four times a day (QID) | ORAL | Status: DC | PRN

## 2017-10-09 MED ORDER — HYDROCODONE-ACETAMINOPHEN 5-325 MG PO TABS
1.0000 | ORAL_TABLET | ORAL | Status: DC | PRN
  Administered 2017-10-09: 2 via ORAL
  Administered 2017-10-10: 1 via ORAL
  Administered 2017-10-10 – 2017-10-14 (×7): 2 via ORAL
  Administered 2017-10-14: 1 via ORAL
  Filled 2017-10-09: qty 1
  Filled 2017-10-09 (×5): qty 2
  Filled 2017-10-09: qty 1
  Filled 2017-10-09 (×3): qty 2

## 2017-10-09 MED ORDER — SODIUM CHLORIDE 0.9% FLUSH
3.0000 mL | Freq: Two times a day (BID) | INTRAVENOUS | Status: DC
  Administered 2017-10-09 – 2017-10-14 (×5): 3 mL via INTRAVENOUS

## 2017-10-09 MED ORDER — FUROSEMIDE 20 MG PO TABS
20.0000 mg | ORAL_TABLET | Freq: Every day | ORAL | Status: DC
  Administered 2017-10-09 – 2017-10-14 (×6): 20 mg via ORAL
  Filled 2017-10-09 (×6): qty 1

## 2017-10-09 MED ORDER — ENSURE ENLIVE PO LIQD
237.0000 mL | Freq: Two times a day (BID) | ORAL | Status: DC
  Administered 2017-10-10 (×2): 237 mL via ORAL

## 2017-10-09 MED ORDER — SPIRONOLACTONE 25 MG PO TABS
50.0000 mg | ORAL_TABLET | Freq: Every day | ORAL | Status: DC
  Administered 2017-10-09 – 2017-10-14 (×6): 50 mg via ORAL
  Filled 2017-10-09: qty 2
  Filled 2017-10-09: qty 1
  Filled 2017-10-09: qty 2
  Filled 2017-10-09: qty 1
  Filled 2017-10-09 (×4): qty 2

## 2017-10-09 MED ORDER — PENTOXIFYLLINE ER 400 MG PO TBCR
400.0000 mg | EXTENDED_RELEASE_TABLET | Freq: Three times a day (TID) | ORAL | Status: DC
  Administered 2017-10-09 – 2017-10-14 (×16): 400 mg via ORAL
  Filled 2017-10-09 (×19): qty 1

## 2017-10-09 NOTE — H&P (Signed)
History and Physical    Briana Sweeney IRC:[id number] DOB: 09-Sep-1971 DOA: 10/08/2017  PCP: Scot Jun, FNP   Patient coming from: Home  Chief Complaint: Auditory halluicinations   HPI: Briana Sweeney is a 47 y.o. female with medical history significant for chronic hepatitis C, alcohol dependence, schizophrenia, and depression, now presenting to the emergency department for evaluation of numerous complaints, including auditory hallucinations.  She reports ongoing alcohol use, consuming a 750 mL bottle of liquor daily.  She denies chest pain or palpitations, denies any recent melena or hematochezia, and denies any significant cough or dyspnea.  She denies suicidal or homicidal ideation.  ED Course: Upon arrival to the ED, patient is found to be afebrile, saturating well on room air, tachycardic in the low 100s initially, and with stable blood pressure.  Throughout the patient's prolonged stay in the emergency department, she became increasingly tachycardic and is also developed visual hallucinations.  EKG features a sinus tachycardia with rate 133, chest x-ray is negative for acute cardiopulmonary disease, d-dimer is elevated to 2.15, and CTA is negative for PE or acute cardiopulmonary disease.  Chemistry panel reveals a potassium of 3.0 and bilirubin of 1.4.  CBC is notable for a chronic stable pancytopenia with WBC 2400, hemoglobin 11.5, and platelets 34,000.  Ethanol level was elevated to 219 when she first arrived 12 hours ago.  Troponin is undetectable and UDS is positive for methamphetamine.  Patient was given 500 cc normal saline, potassium, and Ativan in the ED.  She remains tachycardic and continues to hallucinate, and she will be admitted to the stepdown unit for ongoing evaluation and management of complicated alcohol withdrawal.  Review of Systems:  All other systems reviewed and apart from HPI, are negative.  Past Medical History:  Diagnosis Date  . Anxiety   . Bipolar affective  disorder ([place])    With anxiety features  . 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 Depression   . GERD (gastroesophageal reflux disease)   . Hepatitis C 2018   hepatitis c and alcohol related hepatitis  . History of blood transfusion    "blood doesn't clot; I fell down and had to have a transfusion"  . History of kidney stones   . Migraine    "when I get really stressed" (09/01/2017)  . Schizophrenia ([place])   . Seizures ([place])    "when I run out of my RX; lots recently" (09/01/2017)    Past Surgical History:  Procedure Laterality Date  . ESOPHAGOGASTRODUODENOSCOPY N/A 09/03/2017   Procedure: ESOPHAGOGASTRODUODENOSCOPY (EGD);  Surgeon: Doran Stabler, MD;  Location: [facility];  Service: Gastroenterology;  Laterality: N/A;  . FINGER FRACTURE SURGERY Left    "shattered my pinky"  . FRACTURE SURGERY    . IR PARACENTESIS  07/23/2017  . IR PARACENTESIS  07/2017   "did it twice in the same week" (09/01/2017)  . SHOULDER OPEN ROTATOR CUFF REPAIR Right   . TUBAL LIGATION    . VAGINAL HYSTERECTOMY       reports that  has never smoked. she has never used smokeless tobacco. She reports that she drinks about 268.8 oz of alcohol per week. She reports that she does not use drugs.  No Known Allergies  Family History  Problem Relation Age of Onset  . Lung cancer Mother 81  . Alcohol abuse Mother   . Throat cancer Father 3     Prior to Admission medications   Medication Sig  Start Date End Date Taking? Authorizing Provider  busPIRone (BUSPAR) 7.5 MG tablet Take 1 tablet (7.5 mg total) by mouth 2 (two) times daily. 09/16/17   Rai, Vernelle Emerald, MD  chlordiazePOXIDE (LIBRIUM) 25 MG capsule Take 1 tab twice a day for 3 days, then 1 tab daily for 3 days, then OFF 09/16/17   Rai, Ripudeep K, MD  clotrimazole (GYNE-LOTRIMIN) 1 % vaginal cream Place 1 Applicatorful vaginally at bedtime. 09/16/17   Rai, Vernelle Emerald, MD  furosemide (LASIX) 20 MG tablet Take 1 tablet (20 mg  total) by mouth daily. 09/16/17   Rai, Vernelle Emerald, MD  haloperidol (HALDOL) 2 MG tablet Take 1 tablet (2 mg total) by mouth at bedtime. 09/16/17   Rai, Ripudeep K, MD  pantoprazole (PROTONIX) 40 MG tablet Take 1 tablet (40 mg total) by mouth daily. 09/16/17   Rai, Vernelle Emerald, MD  pentoxifylline (TRENTAL) 400 MG CR tablet Take 1 tablet (400 mg total) by mouth 3 (three) times daily with meals. 09/16/17   Rai, Vernelle Emerald, MD  phenytoin (DILANTIN) 100 MG ER capsule Take 3 capsules (300 mg total) by mouth at bedtime. 09/16/17   Rai, Vernelle Emerald, MD  spironolactone (ALDACTONE) 50 MG tablet Take 1 tablet (50 mg total) by mouth daily. 09/16/17   Rai, Vernelle Emerald, MD  thiamine 100 MG tablet Take 1 tablet (100 mg total) by mouth daily. 09/17/17   Rai, Vernelle Emerald, MD  topiramate (TOPAMAX) 50 MG tablet Take 1 tablet (50 mg total) by mouth daily. 09/16/17   Mendel Corning, MD    Physical Exam: Vitals:   10/08/17 2130 10/08/17 2230 10/08/17 2300 10/08/17 2330  BP: 133/77 125/80 119/78 109/81  Pulse: (!) 135 (!) 133 (!) 114 (!) 114  Resp: (!) 22 (!) 21 20 17   Temp:      TempSrc:      SpO2: 100% 100% 99% 98%      Constitutional: NAD, tremulous Eyes: PERTLA, lids and conjunctivae normal ENMT: Mucous membranes are moist. Posterior pharynx clear of any exudate or lesions.   Neck: normal, supple, no masses, no thyromegaly Respiratory: clear to auscultation bilaterally, no wheezing, no crackles. Normal respiratory effort.    Cardiovascular: Rate ~120 and regular. No significant JVD. Abdomen: Soft, no tenderness, no masses palpated. Bowel sounds active.  Musculoskeletal: no clubbing / cyanosis. No joint deformity upper and lower extremities.   Skin: Scattered echymosses. Warm, dry, well-perfused. Neurologic: CN 2-12 grossly intact. Sensation intact. Strength 5/5 in all 4 limbs. Tremulous Psychiatric: Alert and oriented x 3. Reports active auditory and visual hallucination, denies SI or HI.     Labs on Admission: I  have personally reviewed following labs and imaging studies  CBC: Recent Labs  Lab 10/08/17 1228 10/08/17 1920  WBC 2.4*  --   HGB 11.5*  --   HCT 35.9*  --   MCV 91.8  --   PLT PLATELET CLUMPING, SUGGEST RECOLLECTION OF SAMPLE IN CITRATE TUBE. 34*   Basic Metabolic Panel: Recent Labs  Lab 10/08/17 1228  NA 139  K 3.0*  CL 107  CO2 19*  GLUCOSE 82  BUN <5*  CREATININE 0.53  CALCIUM 8.5*   GFR: CrCl cannot be calculated (Unknown ideal weight.). Liver Function Tests: Recent Labs  Lab 10/08/17 1228  AST 58*  ALT 19  ALKPHOS 176*  BILITOT 1.4*  PROT 8.9*  ALBUMIN 3.2*   No results for input(s): LIPASE, AMYLASE in the last 168 hours. No results for input(s): AMMONIA in  the last 168 hours. Coagulation Profile: No results for input(s): INR, PROTIME in the last 168 hours. Cardiac Enzymes: No results for input(s): CKTOTAL, CKMB, CKMBINDEX, TROPONINI in the last 168 hours. BNP (last 3 results) No results for input(s): PROBNP in the last 8760 hours. HbA1C: No results for input(s): HGBA1C in the last 72 hours. CBG: No results for input(s): GLUCAP in the last 168 hours. Lipid Profile: No results for input(s): CHOL, HDL, LDLCALC, TRIG, CHOLHDL, LDLDIRECT in the last 72 hours. Thyroid Function Tests: No results for input(s): TSH, T4TOTAL, FREET4, T3FREE, THYROIDAB in the last 72 hours. Anemia Panel: No results for input(s): VITAMINB12, FOLATE, FERRITIN, TIBC, IRON, RETICCTPCT in the last 72 hours. Urine analysis:    Component Value Date/Time   COLORURINE YELLOW 09/11/2017 1337   APPEARANCEUR CLEAR 09/11/2017 1337   LABSPEC 1.010 09/11/2017 1337   PHURINE 9.0 (H) 09/11/2017 1337   GLUCOSEU NEGATIVE 09/11/2017 1337   HGBUR NEGATIVE 09/11/2017 1337   BILIRUBINUR NEGATIVE 09/11/2017 1337   KETONESUR NEGATIVE 09/11/2017 1337   PROTEINUR NEGATIVE 09/11/2017 1337   NITRITE NEGATIVE 09/11/2017 1337   LEUKOCYTESUR NEGATIVE 09/11/2017 1337   Sepsis  Labs: @LABRCNTIP (procalcitonin:4,lacticidven:4) )No results found for this or any previous visit (from the past 240 hour(s)).   Radiological Exams on Admission: Dg Chest 2 View  Result Date: 10/08/2017 CLINICAL DATA:  Right chest pain. Neck and back pain following a fall several weeks ago. EXAM: CHEST  2 VIEW COMPARISON:  07/19/2017.  Abdomen and pelvis CT dated 07/24/2017. FINDINGS: Normal sized heart. Clear lungs. Small calcified granuloma at the left lung base and calcified left hilar lymph nodes. Unremarkable bones. Right shoulder fixation anchors. IMPRESSION: No acute abnormality. Electronically Signed   By: Claudie Revering M.D.   On: 10/08/2017 18:49   Ct Angio Chest Pe W/cm &/or Wo Cm  Result Date: 10/08/2017 CLINICAL DATA:  Shortness of breath and cough for 2 weeks. History of DVT. Positive D-dimer. EXAM: CT ANGIOGRAPHY CHEST WITH CONTRAST TECHNIQUE: Multidetector CT imaging of the chest was performed using the standard protocol during bolus administration of intravenous contrast. Multiplanar CT image reconstructions and MIPs were obtained to evaluate the vascular anatomy. CONTRAST:  137mL ISOVUE-370 IOPAMIDOL (ISOVUE-370) INJECTION 76% COMPARISON:  Chest radiograph 10/08/2017 FINDINGS: Cardiovascular: Moderately good opacification of the central and proximal segmental pulmonary arteries. No filling defects. No evidence of significant pulmonary embolus. Normal heart size. No pericardial effusion. Normal caliber thoracic aorta. Great vessel origins are patent. Mediastinum/Nodes: No enlarged mediastinal, hilar, or axillary lymph nodes. Thyroid gland, trachea, and esophagus demonstrate no significant findings. Lungs/Pleura: Lungs are clear. No pleural effusion or pneumothorax. Calcified granuloma in the left lung base. Upper Abdomen: Diffuse fatty infiltration of the liver. Calcified granulomas in the spleen. No acute process identified. Musculoskeletal: No chest wall abnormality. No acute or  significant osseous findings. Review of the MIP images confirms the above findings. IMPRESSION: 1. No evidence of significant pulmonary embolus. 2. No evidence of active pulmonary disease. 3. Diffuse fatty infiltration of the liver. Electronically Signed   By: Lucienne Capers M.D.   On: 10/08/2017 22:24    EKG: Independently reviewed. Sinus tachycardia (rate 133).   Assessment/Plan  1. Alcohol dependence with acute complicated withdrawal  - Presents with a multitude of complaints, noted to become tremulous and increasingly tachycardia during ED stay, reports auditory hallucinations  - She has hx of withdrawal seizures and hx of withdrawal with elevated alcohol level  - Treated in ED with 500 cc NS and Ativan  -  Continue CIWA with Ativan, supplement vitamins and minerals, monitor and replete lytes    2. Cirrhosis  - Attributed to chronic hep C and alcoholism  - Appears compensated  - Continue Lasix, Aldactone, Protonix    3. Hypokalemia  - Serum potassium is 3.0 on admission  - Treated with 40 mEq oral and 10 mEq IV potassium  - Repeat chemistries in am   4. Pancytopenia  - WBC is 2,400 on admission with Hgb 11.5 and platelets 34,000  - All indices are stable from prior CBC's  - No infection or active bleeding identified  - Type and screen   5. Schizophrenia, depression   - Psychiatry consulted for medication-management during admission earlier this month  - Continue Haldol, Buspar, antiepileptics    DVT prophylaxis: SCD's  Code Status: Full  Family Communication: Discussed with patient Disposition Plan: Observe in SDU Consults called: None Admission status: Observation    Vianne Bulls, MD [facility] Pager [phone]  If 7PM-7AM, please contact night-coverage [email or web address] Password [email or web address]  10/09/2017, 12:38 AM

## 2017-10-09 NOTE — ED Notes (Addendum)
Dr. Myna Hidalgo advised of low platelet count.

## 2017-10-09 NOTE — ED Notes (Addendum)
Attempted report and secretary advised it wasn't time for report, only pinged 11 minutes prior. Advised secretary the pt has had a bed for 15 minutes and that is when we are supposed to call.

## 2017-10-09 NOTE — Progress Notes (Signed)
Pt educated about necessary urine sample. Pt will contact RN once sample is ready.   [place]  Rithvik Orcutt, RN

## 2017-10-09 NOTE — Progress Notes (Signed)
Pt complains of burning on urination and a bad odor coming from her peri area.   [place]ri[place]  Estie Sproule, RN

## 2017-10-09 NOTE — Progress Notes (Signed)
[facility]                                                                              Patient Demographics  Briana Sweeney, is a 47 y.o. female, DOB - 18-Nov-1970, UKG:[id number]  Admit date - 10/08/2017   Admitting Physician Briana Bulls, MD  Outpatient Primary MD for the patient is Briana Jun, FNP  Outpatient specialists:   LOS - 0  days   Medical records reviewed and are as summarized below:    Chief Complaint  Patient presents with  . Hallucinations       Brief summary   Patient is a 47 year old female with chronic hepatitis C, alcohol dependence, schizophrenia, depression presented to ED with numerous complaints including auditory hallucinations.  Patient reported ongoing alcohol use, half a gallon of vodka every day, denies suicidal or homicidal ideation.  In ED she was noted to be increasingly tachycardiac with heart rate of 130s, visual hallucination.  D-dimer 2.15, CT angiogram negative for PE.  Patient was admitted for acute alcohol withdrawals.    Assessment & Plan    Principal Problem: History of alcohol abuse with acute alcohol withdrawal ([place]) -Still tremulous, anxious and tachycardiac.  She was noted to be tachycardiac, having visual and auditory hallucinations in ED. -Patient has a history of withdrawal seizures in the past, last alcohol use 1 day before the admission -will continue CIWA with Ativan, MVI, thiamine and folic acid, replete lites as needed  Active Problems:   Hepatitis C, chronic ([place]), alcoholic cirrhosis of liver with ascites -Continue Lasix, Spironolactone, Trental, follow electrolytes closely     Pancytopenia ([place]) with thrombocytopenia due to alcohol abuse -Follow platelet count closely, dropping to 24K today -No obvious bleeding, hence no need or platelet transfusion at this time.    Schizophrenia ([place]) -No SI or HI, psychiatry was consulted for medication management during previous admission -Continue  Haldol, BuSpar, Topamax  History of seizures: Has been considered alcohol withdrawal seizures in the past -Continue Dilantin per outpatient dose  Urinary urgency/frequency/foul odor -Check UA, cultures  Code Status: Full CODE STATUS DVT Prophylaxis: SCDs Family Communication: Discussed in detail with the patient, all imaging results, lab results explained to the patient   Disposition Plan:  Time Spent in minutes   25 minutes  Procedures:    Consultants:     Antimicrobials:      Medications  Scheduled Meds: . busPIRone  7.5 mg Oral BID  . folic acid  1 mg Oral Daily  . furosemide  20 mg Oral Daily  . haloperidol  2 mg Oral QHS  . multivitamin with minerals  1 tablet Oral Daily  . pantoprazole  40 mg Oral Daily  . pentoxifylline  400 mg Oral TID WC  . phenytoin  300 mg Oral QHS  . potassium chloride  40 mEq Oral Once  . sodium chloride flush  3 mL Intravenous Q12H  . sodium chloride flush  3 mL Intravenous Q12H  . sodium chloride flush  3 mL Intravenous Q12H  . spironolactone  50 mg Oral Daily  . thiamine  100 mg Oral Daily  . topiramate  50 mg Oral Daily   Continuous Infusions: . sodium chloride    . sodium chloride     PRN Meds:.sodium chloride, sodium chloride, acetaminophen **OR** acetaminophen, HYDROcodone-acetaminophen, LORazepam, ondansetron **OR** ondansetron (ZOFRAN) IV, sodium chloride flush, sodium chloride flush   Antibiotics   Anti-infectives (From admission, onward)   None        Subjective:   Briana Sweeney was seen and examined today.  Still tremulous, anxious and tachycardic.  No SI or HI. Patient denies dizziness, chest pain, shortness of breath, abdominal pain, N/V/D/C, new weakness.   Objective:   Vitals:   10/09/17 0500 10/09/17 0530 10/09/17 0614 10/09/17 0742  BP: 119/78 123/76 129/89 108/86  Pulse: (!) 134 (!) 126 (!) 115 (!) 109  Resp: 16 16 15 19   Temp:   98.1 F (36.7 C) 98.5 F (36.9 C)  TempSrc:   Oral Oral  SpO2:  100% 99% 100% 100%  Weight:   54.8 kg (120 lb 13 oz)   Height:   5' (1.524 m)     Intake/Output Summary (Last 24 hours) at 10/09/2017 1049 Last data filed at 10/09/2017 1046 Gross per 24 hour  Intake 368 ml  Output -  Net 368 ml     Wt Readings from Last 3 Encounters:  10/09/17 54.8 kg (120 lb 13 oz)  09/23/17 60.3 kg (133 lb)  09/11/17 58.1 kg (128 lb 1.4 oz)     Exam  General: Alert and oriented x 3, NAD, anxious  Eyes:   HEENT:  Atraumatic, normocephalic  Cardiovascular: S1 S2 auscultated, tachycardia, RRR   Respiratory: Clear to auscultation bilaterally, no wheezing, rales or rhonchi  Gastrointestinal: Soft, nontender, nondistended, + bowel sounds  Ext: no pedal edema bilaterally, tremors  Neuro: no neurological deficits  Musculoskeletal: No digital cyanosis, clubbing  Skin: No rashes  Psych: Normal affect and demeanor, alert and oriented x3    Data Reviewed:  I have personally reviewed following labs and imaging studies  Micro Results Recent Results (from the past 240 hour(s))  MRSA PCR Screening     Status: None   Collection Time: 10/09/17  6:17 AM  Result Value Ref Range Status   MRSA by PCR NEGATIVE NEGATIVE Final    Comment:        The GeneXpert MRSA Assay (FDA approved for NASAL specimens only), is one component of a comprehensive MRSA colonization surveillance program. It is not intended to diagnose MRSA infection nor to guide or monitor treatment for MRSA infections.     Radiology Reports Dg Chest 2 View  Result Date: 10/08/2017 CLINICAL DATA:  Right chest pain. Neck and back pain following a fall several weeks ago. EXAM: CHEST  2 VIEW COMPARISON:  07/19/2017.  Abdomen and pelvis CT dated 07/24/2017. FINDINGS: Normal sized heart. Clear lungs. Small calcified granuloma at the left lung base and calcified left hilar lymph nodes. Unremarkable bones. Right shoulder fixation anchors. IMPRESSION: No acute abnormality. Electronically Signed    By: Claudie Revering M.D.   On: 10/08/2017 18:49   Ct Angio Chest Pe W/cm &/or Wo Cm  Result Date: 10/08/2017 CLINICAL DATA:  Shortness of breath and cough for 2 weeks. History of DVT. Positive D-dimer. EXAM: CT ANGIOGRAPHY CHEST WITH CONTRAST TECHNIQUE: Multidetector CT imaging of the chest was performed using the standard protocol during bolus administration of intravenous contrast. Multiplanar CT image reconstructions and MIPs were obtained to evaluate the vascular anatomy. CONTRAST:  135mL ISOVUE-370 IOPAMIDOL (ISOVUE-370) INJECTION 76% COMPARISON:  Chest radiograph 10/08/2017 FINDINGS: Cardiovascular:  Moderately good opacification of the central and proximal segmental pulmonary arteries. No filling defects. No evidence of significant pulmonary embolus. Normal heart size. No pericardial effusion. Normal caliber thoracic aorta. Great vessel origins are patent. Mediastinum/Nodes: No enlarged mediastinal, hilar, or axillary lymph nodes. Thyroid gland, trachea, and esophagus demonstrate no significant findings. Lungs/Pleura: Lungs are clear. No pleural effusion or pneumothorax. Calcified granuloma in the left lung base. Upper Abdomen: Diffuse fatty infiltration of the liver. Calcified granulomas in the spleen. No acute process identified. Musculoskeletal: No chest wall abnormality. No acute or significant osseous findings. Review of the MIP images confirms the above findings. IMPRESSION: 1. No evidence of significant pulmonary embolus. 2. No evidence of active pulmonary disease. 3. Diffuse fatty infiltration of the liver. Electronically Signed   By: Lucienne Capers M.D.   On: 10/08/2017 22:24    Lab Data:  CBC: Recent Labs  Lab 10/08/17 1228 10/08/17 1920 10/09/17 0257  WBC 2.4*  --  3.6*  HGB 11.5*  --  10.3*  HCT 35.9*  --  31.7*  MCV 91.8  --  92.4  PLT PLATELET CLUMPING, SUGGEST RECOLLECTION OF SAMPLE IN CITRATE TUBE. 34* 24*   Basic Metabolic Panel: Recent Labs  Lab 10/08/17 1228  10/09/17 0257  NA 139 134*  K 3.0* 4.0  CL 107 105  CO2 19* 15*  GLUCOSE 82 66  BUN <5* <5*  CREATININE 0.53 0.55  CALCIUM 8.5* 8.4*  MG  --  2.0   GFR: Estimated Creatinine Clearance: 69 mL/min (by C-G formula based on SCr of 0.55 mg/dL). Liver Function Tests: Recent Labs  Lab 10/08/17 1228 10/09/17 0257  AST 58* 48*  ALT 19 16  ALKPHOS 176* 156*  BILITOT 1.4* 1.9*  PROT 8.9* 7.9  ALBUMIN 3.2* 2.8*   No results for input(s): LIPASE, AMYLASE in the last 168 hours. No results for input(s): AMMONIA in the last 168 hours. Coagulation Profile: No results for input(s): INR, PROTIME in the last 168 hours. Cardiac Enzymes: No results for input(s): CKTOTAL, CKMB, CKMBINDEX, TROPONINI in the last 168 hours. BNP (last 3 results) No results for input(s): PROBNP in the last 8760 hours. HbA1C: No results for input(s): HGBA1C in the last 72 hours. CBG: No results for input(s): GLUCAP in the last 168 hours. Lipid Profile: No results for input(s): CHOL, HDL, LDLCALC, TRIG, CHOLHDL, LDLDIRECT in the last 72 hours. Thyroid Function Tests: No results for input(s): TSH, T4TOTAL, FREET4, T3FREE, THYROIDAB in the last 72 hours. Anemia Panel: No results for input(s): VITAMINB12, FOLATE, FERRITIN, TIBC, IRON, RETICCTPCT in the last 72 hours. Urine analysis:    Component Value Date/Time   COLORURINE YELLOW 09/11/2017 [place] 09/11/2017 1337   LABSPEC 1.010 09/11/2017 1337   PHURINE 9.0 (H) 09/11/2017 1337   GLUCOSEU NEGATIVE 09/11/2017 1337   HGBUR NEGATIVE 09/11/2017 1337   BILIRUBINUR NEGATIVE 09/11/2017 1337   KETONESUR NEGATIVE 09/11/2017 1337   PROTEINUR NEGATIVE 09/11/2017 1337   NITRITE NEGATIVE 09/11/2017 1337   LEUKOCYTESUR NEGATIVE 09/11/2017 1337     Briana Sweeney M.D. [facility] 10/09/2017, 10:49 AM  Pager: [phone] Between 7am to 7pm - call Pager - [phone]  After 7pm go to [email or web address] - password [email or web address]  Call night coverage person  covering after 7pm

## 2017-10-10 DIAGNOSIS — F10231 Alcohol dependence with withdrawal delirium: Secondary | ICD-10-CM | POA: Diagnosis not present

## 2017-10-10 LAB — CBC
HCT: 33.6 % — ABNORMAL LOW (ref 36.0–46.0)
Hemoglobin: 10.9 g/dL — ABNORMAL LOW (ref 12.0–15.0)
MCH: 29.9 pg (ref 26.0–34.0)
MCHC: 32.4 g/dL (ref 30.0–36.0)
MCV: 92.3 fL (ref 78.0–100.0)
Platelets: 17 10*3/uL — CL (ref 150–400)
RBC: 3.64 MIL/uL — ABNORMAL LOW (ref 3.87–5.11)
RDW: 17.3 % — ABNORMAL HIGH (ref 11.5–15.5)
WBC: 2.2 10*3/uL — ABNORMAL LOW (ref 4.0–10.5)

## 2017-10-10 LAB — BASIC METABOLIC PANEL
Anion gap: 10 (ref 5–15)
BUN: <5 mg/dL — ABNORMAL LOW (ref 6–20)
CO2: 23 mmol/L (ref 22–32)
Calcium: 8.8 mg/dL — ABNORMAL LOW (ref 8.9–10.3)
Chloride: 104 mmol/L (ref 101–111)
Creatinine, Ser: 0.78 mg/dL (ref 0.44–1.00)
GFR calc Af Amer: >60 mL/min (ref >60)
GFR calc non Af Amer: >60 mL/min (ref >60)
Glucose, Bld: 101 mg/dL — ABNORMAL HIGH (ref 65–99)
Potassium: 3.5 mmol/L (ref 3.5–5.1)
Sodium: 137 mmol/L (ref 135–145)

## 2017-10-10 LAB — MAGNESIUM: Magnesium: 1.9 mg/dL (ref 1.7–2.4)

## 2017-10-10 LAB — TROPONIN I: Troponin I: <0.03 ng/mL (ref <0.03)

## 2017-10-10 MED ORDER — MAGNESIUM CITRATE PO SOLN
1.0000 | Freq: Once | ORAL | Status: AC
  Administered 2017-10-10: 1 via ORAL
  Filled 2017-10-10: qty 296

## 2017-10-10 MED ORDER — HYDROMORPHONE HCL 1 MG/ML IJ SOLN
1.0000 mg | Freq: Once | INTRAMUSCULAR | Status: AC
  Administered 2017-10-10: 1 mg via INTRAVENOUS
  Filled 2017-10-10: qty 1

## 2017-10-10 MED ORDER — SENNOSIDES-DOCUSATE SODIUM 8.6-50 MG PO TABS
1.0000 | ORAL_TABLET | Freq: Two times a day (BID) | ORAL | Status: DC
  Administered 2017-10-10 – 2017-10-14 (×7): 1 via ORAL
  Filled 2017-10-10 (×8): qty 1

## 2017-10-10 MED ORDER — BOOST / RESOURCE BREEZE PO LIQD CUSTOM
1.0000 | Freq: Three times a day (TID) | ORAL | Status: DC
  Administered 2017-10-10 – 2017-10-13 (×6): 1 via ORAL

## 2017-10-10 NOTE — Progress Notes (Signed)
Trental pill found on pt's room floor. Pt educated on risk of missing doses. Will continue to monitor.   [place]  Traniyah Hallett, RN

## 2017-10-10 NOTE — Progress Notes (Signed)
[facility]                                                                              Patient Demographics  Briana Sweeney, is a 47 y.o. female, DOB - 02-02-1971, PJA:[id number]  Admit date - 10/08/2017   Admitting Physician Vianne Bulls, MD  Outpatient Primary MD for the patient is Scot Jun, FNP  Outpatient specialists:   LOS - 1  days   Medical records reviewed and are as summarized below:    Chief Complaint  Patient presents with  . Hallucinations       Brief summary   Patient is a 47 year old female with chronic hepatitis C, alcohol dependence, schizophrenia, depression presented to ED with numerous complaints including auditory hallucinations.  Patient reported ongoing alcohol use, half a gallon of vodka every day, denies suicidal or homicidal ideation.  In ED she was noted to be increasingly tachycardiac with heart rate of 130s, visual hallucination.  D-dimer 2.15, CT angiogram negative for PE.  Patient was admitted for acute alcohol withdrawals.    Assessment & Plan    Principal Problem: History of alcohol abuse with acute alcohol withdrawal ([place]) -Improving but appears to be somewhat depressed and anxious.   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 -Platelet count continues to drop, follow closely    Schizophrenia ([place]) -No SI or HI, psychiatry was consulted for medication management during previous admission -Psychiatry  consulted due to recurrent admissions for alcohol withdrawals however recommended social work assistance  - continue Haldol, BuSpar, Topamax  History of seizures: Has been considered alcohol withdrawal seizures in the past -Continue Dilantin per outpatient dose  Urinary urgency/frequency/foul odor -UA negative for UTI  Code Status: Full CODE STATUS DVT Prophylaxis: SCDs Family Communication: Discussed in detail with the patient, all imaging results, lab results explained to the patient   Disposition Plan:  Time Spent in minutes   25 minutes  Procedures:    Consultants:     Antimicrobials:      Medications  Scheduled Meds: . busPIRone  7.5 mg Oral BID  . feeding supplement (ENSURE ENLIVE)  237 mL Oral BID BM  . folic acid  1 mg Oral Daily  . furosemide  20 mg Oral Daily  . haloperidol  2 mg Oral QHS  . multivitamin with minerals  1 tablet Oral Daily  . pantoprazole  40 mg Oral Daily  . pentoxifylline  400 mg Oral TID WC  . phenytoin  300 mg Oral QHS  . potassium chloride  40 mEq Oral Once  . senna-docusate  1 tablet Oral BID  . sodium chloride flush  3 mL Intravenous Q12H  . sodium chloride flush  3  mL Intravenous Q12H  . sodium chloride flush  3 mL Intravenous Q12H  . spironolactone  50 mg Oral Daily  . thiamine  100 mg Oral Daily  . topiramate  50 mg Oral Daily   Continuous Infusions: . sodium chloride    . sodium chloride     PRN Meds:.sodium chloride, sodium chloride, acetaminophen **OR** acetaminophen, HYDROcodone-acetaminophen, LORazepam, ondansetron **OR** ondansetron (ZOFRAN) IV, sodium chloride flush, sodium chloride flush   Antibiotics   Anti-infectives (From admission, onward)   None        Subjective:   Briana Sweeney was seen and examined today.  Feeling anxious otherwise stable, no SI or HI.  No SI or HI. Patient denies dizziness, chest pain, shortness of breath, abdominal pain, N/V/D/C, new weakness.   Objective:   Vitals:   10/09/17  2317 10/10/17 0500 10/10/17 0753 10/10/17 1150  BP: 102/73  101/74 106/79  Pulse: (!) 105  (!) 102 (!) 110  Resp:   18 15  Temp: 98.9 F (37.2 C)  98.5 F (36.9 C) (!) 97.5 F (36.4 C)  TempSrc: Oral  Oral Oral  SpO2: 100%  97%   Weight:  55.6 kg (122 lb 9.2 oz)    Height:        Intake/Output Summary (Last 24 hours) at 10/10/2017 1342 Last data filed at 10/10/2017 1030 Gross per 24 hour  Intake 10 ml  Output -  Net 10 ml     Wt Readings from Last 3 Encounters:  10/10/17 55.6 kg (122 lb 9.2 oz)  09/23/17 60.3 kg (133 lb)  09/11/17 58.1 kg (128 lb 1.4 oz)     Exam  General: Alert and oriented x 3, NAD, anxious  Eyes: HEENT:  Atraumatic, normocephalic, normal oropharynx Cardiovascular: S1 S2 auscultated, no rubs, murmurs or gallops. Regular rate and rhythm. No pedal edema b/l Respiratory: Clear to auscultation bilaterally, no wheezing, rales or rhonchi Gastrointestinal: Soft, nontender, nondistended, + bowel sounds Ext: no pedal edema bilaterally Neuro: no new deficits Musculoskeletal: No digital cyanosis, clubbing Skin: No rashes Psych: Normal affect and demeanor, alert and oriented x3      Data Reviewed:  I have personally reviewed following labs and imaging studies  Micro Results Recent Results (from the past 240 hour(s))  MRSA PCR Screening     Status: None   Collection Time: 10/09/17  6:17 AM  Result Value Ref Range Status   MRSA by PCR NEGATIVE NEGATIVE Final    Comment:        The GeneXpert MRSA Assay (FDA approved for NASAL specimens only), is one component of a comprehensive MRSA colonization surveillance program. It is not intended to diagnose MRSA infection nor to guide or monitor treatment for MRSA infections.     Radiology Reports Dg Chest 2 View  Result Date: 10/08/2017 CLINICAL DATA:  Right chest pain. Neck and back pain following a fall several weeks ago. EXAM: CHEST  2 VIEW COMPARISON:  07/19/2017.  Abdomen and pelvis CT dated  07/24/2017. FINDINGS: Normal sized heart. Clear lungs. Small calcified granuloma at the left lung base and calcified left hilar lymph nodes. Unremarkable bones. Right shoulder fixation anchors. IMPRESSION: No acute abnormality. Electronically Signed   By: Claudie Revering M.D.   On: 10/08/2017 18:49   Ct Angio Chest Pe W/cm &/or Wo Cm  Result Date: 10/08/2017 CLINICAL DATA:  Shortness of breath and cough for 2 weeks. History of DVT. Positive D-dimer. EXAM: CT ANGIOGRAPHY CHEST WITH CONTRAST TECHNIQUE: Multidetector CT imaging of the chest  was performed using the standard protocol during bolus administration of intravenous contrast. Multiplanar CT image reconstructions and MIPs were obtained to evaluate the vascular anatomy. CONTRAST:  160mL ISOVUE-370 IOPAMIDOL (ISOVUE-370) INJECTION 76% COMPARISON:  Chest radiograph 10/08/2017 FINDINGS: Cardiovascular: Moderately good opacification of the central and proximal segmental pulmonary arteries. No filling defects. No evidence of significant pulmonary embolus. Normal heart size. No pericardial effusion. Normal caliber thoracic aorta. Great vessel origins are patent. Mediastinum/Nodes: No enlarged mediastinal, hilar, or axillary lymph nodes. Thyroid gland, trachea, and esophagus demonstrate no significant findings. Lungs/Pleura: Lungs are clear. No pleural effusion or pneumothorax. Calcified granuloma in the left lung base. Upper Abdomen: Diffuse fatty infiltration of the liver. Calcified granulomas in the spleen. No acute process identified. Musculoskeletal: No chest wall abnormality. No acute or significant osseous findings. Review of the MIP images confirms the above findings. IMPRESSION: 1. No evidence of significant pulmonary embolus. 2. No evidence of active pulmonary disease. 3. Diffuse fatty infiltration of the liver. Electronically Signed   By: Lucienne Capers M.D.   On: 10/08/2017 22:24    Lab Data:  CBC: Recent Labs  Lab 10/08/17 1228 10/08/17 1920  10/09/17 0257 10/10/17 0244  WBC 2.4*  --  3.6* 2.2*  HGB 11.5*  --  10.3* 10.9*  HCT 35.9*  --  31.7* 33.6*  MCV 91.8  --  92.4 92.3  PLT PLATELET CLUMPING, SUGGEST RECOLLECTION OF SAMPLE IN CITRATE TUBE. 34* 24* 17*   Basic Metabolic Panel: Recent Labs  Lab 10/08/17 1228 10/09/17 0257 10/10/17 0244  NA 139 134* 137  K 3.0* 4.0 3.5  CL 107 105 104  CO2 19* 15* 23  GLUCOSE 82 66 101*  BUN <5* <5* <5*  CREATININE 0.53 0.55 0.78  CALCIUM 8.5* 8.4* 8.8*  MG  --  2.0 1.9   GFR: Estimated Creatinine Clearance: 69.4 mL/min (by C-G formula based on SCr of 0.78 mg/dL). Liver Function Tests: Recent Labs  Lab 10/08/17 1228 10/09/17 0257  AST 58* 48*  ALT 19 16  ALKPHOS 176* 156*  BILITOT 1.4* 1.9*  PROT 8.9* 7.9  ALBUMIN 3.2* 2.8*   No results for input(s): LIPASE, AMYLASE in the last 168 hours. No results for input(s): AMMONIA in the last 168 hours. Coagulation Profile: No results for input(s): INR, PROTIME in the last 168 hours. Cardiac Enzymes: Recent Labs  Lab 10/10/17 0950  TROPONINI <0.03   BNP (last 3 results) No results for input(s): PROBNP in the last 8760 hours. HbA1C: No results for input(s): HGBA1C in the last 72 hours. CBG: No results for input(s): GLUCAP in the last 168 hours. Lipid Profile: No results for input(s): CHOL, HDL, LDLCALC, TRIG, CHOLHDL, LDLDIRECT in the last 72 hours. Thyroid Function Tests: No results for input(s): TSH, T4TOTAL, FREET4, T3FREE, THYROIDAB in the last 72 hours. Anemia Panel: No results for input(s): VITAMINB12, FOLATE, FERRITIN, TIBC, IRON, RETICCTPCT in the last 72 hours. Urine analysis:    Component Value Date/Time   COLORURINE YELLOW 10/09/2017 1038   APPEARANCEUR CLEAR 10/09/2017 1038   LABSPEC 1.016 10/09/2017 1038   PHURINE 5.0 10/09/2017 1038   GLUCOSEU NEGATIVE 10/09/2017 1038   HGBUR NEGATIVE 10/09/2017 1038   BILIRUBINUR NEGATIVE 10/09/2017 1038   KETONESUR 80 (A) 10/09/2017 1038   PROTEINUR NEGATIVE  10/09/2017 1038   NITRITE NEGATIVE 10/09/2017 [place] 10/09/2017 [place] M.D. [facility] 10/10/2017, 1:42 PM  Pager: [phone] Between 7am to 7pm - call Pager - [phone]  After  7pm go to [email or web address] - password [email or web address]  Call night coverage person covering after 7pm

## 2017-10-10 NOTE — Progress Notes (Signed)
Pt pulled out only IV line; acting lethargic; states she is very itchy; A&O x4; Rai MD paged. IV team consult placed. Will hand off info in report as well. Will continue to monitor.   [place]  Geriann Lafont, RN

## 2017-10-10 NOTE — Care Management (Signed)
Pt recently discharged from hospital with appt at [facility] for the same day that she was readmitted.  CM set up new appt for 10/21/17 at 1pm.  Pt utilizes [facility] pharmacy

## 2017-10-10 NOTE — Progress Notes (Signed)
Initial Nutrition Assessment  DOCUMENTATION CODES:   Not applicable  INTERVENTION:   -D/c Ensure Enlive po BID, each supplement provides 350 kcal and 20 grams of protein -Boost Breeze po TID, each supplement provides 250 kcal and 9 grams of protein -Continue MVI daily  NUTRITION DIAGNOSIS:   Inadequate oral intake related to altered GI function, decreased appetite as evidenced by meal completion < 50%.  GOAL:   Patient will meet greater than or equal to 90% of their needs  MONITOR:   PO intake, Supplement acceptance, Labs, Weight trends, Diet advancement, Skin, I & O's  REASON FOR ASSESSMENT:   Malnutrition Screening Tool    ASSESSMENT:   Patient is a 47 year old female with chronic hepatitis C, alcohol dependence, schizophrenia, depression presented to ED with numerous complaints including auditory hallucinations.  Patient reported ongoing alcohol use, half a gallon of vodka every day, denies suicidal or homicidal ideation.  Pt admitted with alcohol withdrawal.   Spoke with pt at bedside, who was minimally interactive at time of visit. She reports poor oral intake, consuming soup at time of visit. She shares she ate very little breakfast, just a few bites of grits and eggs. Noted meal completion 100%. Pt shares she does not like Ensure supplements, and would prefer Boost Breeze.   Pt did not provide any details regarding wt hx; noted pt has experienced a 10.9% wt loss over the past 3 months, which is significant for time frame. Suspect some wt loss may be related to dehydration.   Per MD notes, attempting to place in ETOH rehab, due to multiple admissions related to ETOH abuse.   Medications reviewed and include folvite, vitamin B-1lasix, MVI, dilantin, and KCl.   Labs reviewed.   NUTRITION - FOCUSED PHYSICAL EXAM:    Most Recent Value  Orbital Region  No depletion  Upper Arm Region  No depletion  Thoracic and Lumbar Region  No depletion  Buccal Region  No  depletion  Temple Region  No depletion  Clavicle Bone Region  No depletion  Clavicle and Acromion Bone Region  No depletion  Scapular Bone Region  No depletion  Dorsal Hand  No depletion  Patellar Region  No depletion  Anterior Thigh Region  No depletion  Posterior Calf Region  No depletion  Edema (RD Assessment)  None  Hair  Reviewed  Eyes  Reviewed  Mouth  Reviewed  Skin  Reviewed  Nails  Reviewed       Diet Order:  Diet Heart Room service appropriate? Yes; Fluid consistency: Thin  EDUCATION NEEDS:   Education needs have been addressed  Skin:  Skin Assessment: Reviewed RN Assessment  Last BM:  PTA  Height:   Ht Readings from Last 1 Encounters:  10/09/17 5' (1.524 m)    Weight:   Wt Readings from Last 1 Encounters:  10/10/17 122 lb 9.2 oz (55.6 kg)    Ideal Body Weight:  45.5 kg  BMI:  Body mass index is 23.94 kg/m.  Estimated Nutritional Needs:   Kcal:  1650-1850  Protein:  80-95 grams  Fluid:  > 1.6 L    Pio Eatherly A. Jimmye Norman, RD, LDN, CDE Pager: [phone] After hours Pager: [phone]

## 2017-10-10 NOTE — Progress Notes (Signed)
Pt's home meds found, counted, & taken to pharmacy.   [place]  Ruqaya Strauss, RN

## 2017-10-11 DIAGNOSIS — K7031 Alcoholic cirrhosis of liver with ascites: Secondary | ICD-10-CM | POA: Diagnosis not present

## 2017-10-11 DIAGNOSIS — F10231 Alcohol dependence with withdrawal delirium: Secondary | ICD-10-CM | POA: Diagnosis not present

## 2017-10-11 DIAGNOSIS — B182 Chronic viral hepatitis C: Secondary | ICD-10-CM | POA: Diagnosis not present

## 2017-10-11 LAB — BASIC METABOLIC PANEL
Anion gap: 9 (ref 5–15)
BUN: 5 mg/dL — ABNORMAL LOW (ref 6–20)
CO2: 24 mmol/L (ref 22–32)
Calcium: 8.8 mg/dL — ABNORMAL LOW (ref 8.9–10.3)
Chloride: 104 mmol/L (ref 101–111)
Creatinine, Ser: 0.69 mg/dL (ref 0.44–1.00)
GFR calc Af Amer: >60 mL/min (ref >60)
GFR calc non Af Amer: >60 mL/min (ref >60)
Glucose, Bld: 116 mg/dL — ABNORMAL HIGH (ref 65–99)
Potassium: 3.1 mmol/L — ABNORMAL LOW (ref 3.5–5.1)
Sodium: 137 mmol/L (ref 135–145)

## 2017-10-11 LAB — CBC
HCT: 33.5 % — ABNORMAL LOW (ref 36.0–46.0)
Hemoglobin: 10.7 g/dL — ABNORMAL LOW (ref 12.0–15.0)
MCH: 29.7 pg (ref 26.0–34.0)
MCHC: 31.9 g/dL (ref 30.0–36.0)
MCV: 93.1 fL (ref 78.0–100.0)
Platelets: 16 10*3/uL — CL (ref 150–400)
RBC: 3.6 MIL/uL — ABNORMAL LOW (ref 3.87–5.11)
RDW: 17.4 % — ABNORMAL HIGH (ref 11.5–15.5)
WBC: 2.3 10*3/uL — ABNORMAL LOW (ref 4.0–10.5)

## 2017-10-11 LAB — URINE CULTURE: Culture: <10000 — AB

## 2017-10-11 MED ORDER — POTASSIUM CHLORIDE CRYS ER 20 MEQ PO TBCR
40.0000 meq | EXTENDED_RELEASE_TABLET | Freq: Once | ORAL | Status: AC
  Administered 2017-10-11: 40 meq via ORAL
  Filled 2017-10-11: qty 2

## 2017-10-11 NOTE — Progress Notes (Signed)
[facility]                                                                              Patient Demographics  Briana Sweeney, is a 47 y.o. female, DOB - [birth year], DJS:[id number]  Admit date - 10/08/2017   Admitting Physician Vianne Bulls, MD  Outpatient Primary MD for the patient is Scot Jun, FNP  Outpatient specialists:   LOS - 2  days   Medical records reviewed and are as summarized below:    Chief Complaint  Patient presents with  . Hallucinations       Brief summary   Patient is a 48 year old female with chronic hepatitis C, alcohol dependence, schizophrenia, depression presented to ED with numerous complaints including auditory hallucinations.  Patient reported ongoing alcohol use, half a gallon of vodka every day, denies suicidal or homicidal ideation.  In ED she was noted to be increasingly tachycardiac with heart rate of 130s, visual hallucination.  D-dimer 2.15, CT angiogram negative for PE.  Patient was admitted for acute alcohol withdrawals.    Assessment & Plan    Principal Problem: History of alcohol abuse with acute alcohol withdrawal ([place]) -Improving but appears to be somewhat depressed and anxious.  No hallucinations. -Patient has a history of withdrawal seizures in the past, last alcohol use 1 day before the admission - CIWA 9 this am - Psychiatry consulted-however recommended social work assistance with inpatient alcohol rehab program if she qualifies.  Patient was admitted 1/3-09/16/17 for the same, social worker had assisted with residential substance rehab places.  However [facility] did not accept her due to history of seizures and [facility] also denied the patient due to her medical issues. -Unfortunately nothing else to offer unless patient goes to inpatient behavioral health.  Patient continues to drink alcohol on daily basis and has been counseled strongly against it. -  will continue CIWA with Ativan, thiamine, MVI, folate for  acute alcohol withdrawals.   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  If platelet counts less than 10, will need platelet transfusion    Schizophrenia ([place]) -No SI or HI, psychiatry was consulted for medication management during previous admission -Psychiatry consulted on 2/1, per Dr Mariea Clonts, it is patient's decision to make regarding inpatient versus outpatient alcohol rehab and deferred to social work for assistance. - continue Haldol, BuSpar, Topamax  History of seizures: Has been considered alcohol withdrawal seizures in the past -Continue Dilantin per outpatient dose  Urinary urgency/frequency/foul odor -UA negative for UTI  Constipation -Continue bowel regimen  Code Status: Full CODE STATUS DVT Prophylaxis: SCDs Family Communication: Discussed in detail with the patient, all imaging results, lab results explained to the patient   Disposition Plan: Likely DC in the next 48-72 hours pending improvement with acute alcohol withdrawals  Time Spent in minutes   25 minutes  Procedures:    Consultants:  Antimicrobials:      Medications  Scheduled Meds: . busPIRone  7.5 mg Oral BID  . feeding supplement  1 Container Oral TID BM  . folic acid  1 mg Oral Daily  . furosemide  20 mg Oral Daily  . haloperidol  2 mg Oral QHS  . multivitamin with minerals  1 tablet Oral Daily  . pantoprazole  40 mg Oral Daily  . pentoxifylline  400 mg Oral TID WC  . phenytoin  300 mg Oral QHS  . potassium chloride  40 mEq Oral Once  . senna-docusate  1 tablet Oral BID  . sodium chloride flush  3 mL Intravenous Q12H  . sodium chloride flush  3 mL Intravenous Q12H  .  sodium chloride flush  3 mL Intravenous Q12H  . spironolactone  50 mg Oral Daily  . thiamine  100 mg Oral Daily  . topiramate  50 mg Oral Daily   Continuous Infusions: . sodium chloride    . sodium chloride     PRN Meds:.sodium chloride, sodium chloride, acetaminophen **OR** acetaminophen, HYDROcodone-acetaminophen, LORazepam, ondansetron **OR** ondansetron (ZOFRAN) IV, sodium chloride flush, sodium chloride flush   Antibiotics   Anti-infectives (From admission, onward)   None        Subjective:   Briana Sweeney was seen and examined today.  States back hurts, 7/10, denies any fall. At the time of my arrival to the room, patient was comfortably resting.  No SI or HI.  Patient denies dizziness, chest pain, shortness of breath,  N/V, new weakness.   Objective:   Vitals:   10/10/17 2352 10/11/17 0418 10/11/17 0513 10/11/17 0825  BP: 98/73 91/68  103/72  Pulse: (!) 102 97    Resp: 12   17  Temp: 98.7 F (37.1 C) 98.2 F (36.8 C)  98.1 F (36.7 C)  TempSrc: Oral Oral  Oral  SpO2: 98% 96%    Weight:   53.4 kg (117 lb 11.6 oz)   Height:        Intake/Output Summary (Last 24 hours) at 10/11/2017 1124 Last data filed at 10/10/2017 2222 Gross per 24 hour  Intake 838 ml  Output -  Net 838 ml     Wt Readings from Last 3 Encounters:  10/11/17 53.4 kg (117 lb 11.6 oz)  09/23/17 60.3 kg (133 lb)  09/11/17 58.1 kg (128 lb 1.4 oz)     Exam General: Alert and oriented x 3, NAD Eyes:  HEENT:  Cardiovascular: S1 S2 clear, RRR No pedal edema b/l Respiratory: CTA B Gastrointestinal: Soft, nontender, mildly distended, + bowel sounds Ext: no pedal edema bilaterally Neuro: no new deficit Musculoskeletal: No digital cyanosis, clubbing Skin: No rashes Psych: Normal affect and demeanor, alert and oriented x3      Data Reviewed:  I have personally reviewed following labs and imaging studies  Micro Results Recent Results (from the past 240 hour(s))  MRSA PCR Screening      Status: None   Collection Time: 10/09/17  6:17 AM  Result Value Ref Range Status   MRSA by PCR NEGATIVE NEGATIVE Final    Comment:        The GeneXpert MRSA Assay (FDA approved for NASAL specimens only), is one component of a comprehensive MRSA colonization surveillance program. It is not intended to diagnose MRSA infection nor to guide or monitor treatment for MRSA infections.     Radiology Reports Dg Chest 2 View  Result Date: 10/08/2017 CLINICAL DATA:  Right chest pain. Neck and  back pain following a fall several weeks ago. EXAM: CHEST  2 VIEW COMPARISON:  07/19/2017.  Abdomen and pelvis CT dated 07/24/2017. FINDINGS: Normal sized heart. Clear lungs. Small calcified granuloma at the left lung base and calcified left hilar lymph nodes. Unremarkable bones. Right shoulder fixation anchors. IMPRESSION: No acute abnormality. Electronically Signed   By: Claudie Revering M.D.   On: 10/08/2017 18:49   Ct Angio Chest Pe W/cm &/or Wo Cm  Result Date: 10/08/2017 CLINICAL DATA:  Shortness of breath and cough for 2 weeks. History of DVT. Positive D-dimer. EXAM: CT ANGIOGRAPHY CHEST WITH CONTRAST TECHNIQUE: Multidetector CT imaging of the chest was performed using the standard protocol during bolus administration of intravenous contrast. Multiplanar CT image reconstructions and MIPs were obtained to evaluate the vascular anatomy. CONTRAST:  140mL ISOVUE-370 IOPAMIDOL (ISOVUE-370) INJECTION 76% COMPARISON:  Chest radiograph 10/08/2017 FINDINGS: Cardiovascular: Moderately good opacification of the central and proximal segmental pulmonary arteries. No filling defects. No evidence of significant pulmonary embolus. Normal heart size. No pericardial effusion. Normal caliber thoracic aorta. Great vessel origins are patent. Mediastinum/Nodes: No enlarged mediastinal, hilar, or axillary lymph nodes. Thyroid gland, trachea, and esophagus demonstrate no significant findings. Lungs/Pleura: Lungs are clear. No pleural  effusion or pneumothorax. Calcified granuloma in the left lung base. Upper Abdomen: Diffuse fatty infiltration of the liver. Calcified granulomas in the spleen. No acute process identified. Musculoskeletal: No chest wall abnormality. No acute or significant osseous findings. Review of the MIP images confirms the above findings. IMPRESSION: 1. No evidence of significant pulmonary embolus. 2. No evidence of active pulmonary disease. 3. Diffuse fatty infiltration of the liver. Electronically Signed   By: Lucienne Capers M.D.   On: 10/08/2017 22:24    Lab Data:  CBC: Recent Labs  Lab 10/08/17 1228 10/08/17 1920 10/09/17 0257 10/10/17 0244 10/11/17 0231  WBC 2.4*  --  3.6* 2.2* 2.3*  HGB 11.5*  --  10.3* 10.9* 10.7*  HCT 35.9*  --  31.7* 33.6* 33.5*  MCV 91.8  --  92.4 92.3 93.1  PLT PLATELET CLUMPING, SUGGEST RECOLLECTION OF SAMPLE IN CITRATE TUBE. 34* 24* 17* 16*   Basic Metabolic Panel: Recent Labs  Lab 10/08/17 1228 10/09/17 0257 10/10/17 0244 10/11/17 0231  NA 139 134* 137 137  K 3.0* 4.0 3.5 3.1*  CL 107 105 104 104  CO2 19* 15* 23 24  GLUCOSE 82 66 101* 116*  BUN <5* <5* <5* 5*  CREATININE 0.53 0.55 0.78 0.69  CALCIUM 8.5* 8.4* 8.8* 8.8*  MG  --  2.0 1.9  --    GFR: Estimated Creatinine Clearance: 63.8 mL/min (by C-G formula based on SCr of 0.69 mg/dL). Liver Function Tests: Recent Labs  Lab 10/08/17 1228 10/09/17 0257  AST 58* 48*  ALT 19 16  ALKPHOS 176* 156*  BILITOT 1.4* 1.9*  PROT 8.9* 7.9  ALBUMIN 3.2* 2.8*   No results for input(s): LIPASE, AMYLASE in the last 168 hours. No results for input(s): AMMONIA in the last 168 hours. Coagulation Profile: No results for input(s): INR, PROTIME in the last 168 hours. Cardiac Enzymes: Recent Labs  Lab 10/10/17 0950  TROPONINI <0.03   BNP (last 3 results) No results for input(s): PROBNP in the last 8760 hours. HbA1C: No results for input(s): HGBA1C in the last 72 hours. CBG: No results for input(s):  GLUCAP in the last 168 hours. Lipid Profile: No results for input(s): CHOL, HDL, LDLCALC, TRIG, CHOLHDL, LDLDIRECT in the last 72 hours. Thyroid Function Tests: No results  for input(s): TSH, T4TOTAL, FREET4, T3FREE, THYROIDAB in the last 72 hours. Anemia Panel: No results for input(s): VITAMINB12, FOLATE, FERRITIN, TIBC, IRON, RETICCTPCT in the last 72 hours. Urine analysis:    Component Value Date/Time   COLORURINE YELLOW 10/09/2017 1038   APPEARANCEUR CLEAR 10/09/2017 1038   LABSPEC 1.016 10/09/2017 1038   PHURINE 5.0 10/09/2017 1038   GLUCOSEU NEGATIVE 10/09/2017 1038   HGBUR NEGATIVE 10/09/2017 1038   BILIRUBINUR NEGATIVE 10/09/2017 1038   KETONESUR 80 (A) 10/09/2017 1038   PROTEINUR NEGATIVE 10/09/2017 1038   NITRITE NEGATIVE 10/09/2017 [place] 10/09/2017 [place] M.D. [facility] 10/11/2017, 11:24 AM  Pager: [phone] Between 7am to 7pm - call Pager - [phone]  After 7pm go to [email or web address] - password [email or web address]  Call night coverage person covering after 7pm

## 2017-10-12 DIAGNOSIS — F10231 Alcohol dependence with withdrawal delirium: Secondary | ICD-10-CM | POA: Diagnosis not present

## 2017-10-12 DIAGNOSIS — K7031 Alcoholic cirrhosis of liver with ascites: Secondary | ICD-10-CM | POA: Diagnosis not present

## 2017-10-12 LAB — CBC
HCT: 32.3 % — ABNORMAL LOW (ref 36.0–46.0)
Hemoglobin: 10.4 g/dL — ABNORMAL LOW (ref 12.0–15.0)
MCH: 29.7 pg (ref 26.0–34.0)
MCHC: 32.2 g/dL (ref 30.0–36.0)
MCV: 92.3 fL (ref 78.0–100.0)
RBC: 3.5 MIL/uL — ABNORMAL LOW (ref 3.87–5.11)
RDW: 17.5 % — ABNORMAL HIGH (ref 11.5–15.5)
WBC: 4.3 10*3/uL (ref 4.0–10.5)

## 2017-10-12 LAB — PLATELET COUNT
Platelets: ADEQUATE 10*3/uL (ref 150–400)
Platelets: DECREASED 10*3/uL (ref 150–400)

## 2017-10-12 LAB — BASIC METABOLIC PANEL
Anion gap: 9 (ref 5–15)
BUN: <5 mg/dL — ABNORMAL LOW (ref 6–20)
CO2: 19 mmol/L — ABNORMAL LOW (ref 22–32)
Calcium: 8.5 mg/dL — ABNORMAL LOW (ref 8.9–10.3)
Chloride: 106 mmol/L (ref 101–111)
Creatinine, Ser: 0.67 mg/dL (ref 0.44–1.00)
GFR calc Af Amer: >60 mL/min (ref >60)
GFR calc non Af Amer: >60 mL/min (ref >60)
Glucose, Bld: 91 mg/dL (ref 65–99)
Potassium: 4.1 mmol/L (ref 3.5–5.1)
Sodium: 134 mmol/L — ABNORMAL LOW (ref 135–145)

## 2017-10-12 MED ORDER — FLUCONAZOLE 100 MG PO TABS
100.0000 mg | ORAL_TABLET | Freq: Once | ORAL | Status: AC
  Administered 2017-10-12: 100 mg via ORAL
  Filled 2017-10-12: qty 1

## 2017-10-12 NOTE — Progress Notes (Signed)
Claimed that she feels like craving for alcohol,could  hear something like buzzing, claimed "I need my ativan injection". Continue to monitor.

## 2017-10-12 NOTE — Progress Notes (Signed)
Transferred to [facility] by wheelchair, stable, report given to RN. Belongings with the pt.

## 2017-10-12 NOTE — Progress Notes (Signed)
PROGRESS NOTE  Briana Sweeney VHQ:[id number] DOB: [birth year] DOA: 10/08/2017 PCP: Scot Jun, FNP   LOS: 3 days   Brief Narrative / Interim history: 47 year old female with chronic hepatitis C, alcohol dependence, schizophrenia, depression presented to ED with numerous complaints including auditory hallucinations.  Patient reported ongoing alcohol use, half a gallon of vodka every day, denies suicidal or homicidal ideation.  In ED she was noted to be increasingly tachycardiac with heart rate of 130s, visual hallucination.  D-dimer 2.15, CT angiogram negative for PE.  Patient was admitted for acute alcohol withdrawals.   Assessment & Plan: Principal Problem:   Alcohol withdrawal (HCC) Active Problems:   Hepatitis C, chronic (HCC)   Hypokalemia   Pancytopenia (HCC)   Alcoholic cirrhosis of liver with ascites (HCC)   Acute hyperactive alcohol withdrawal delirium (HCC)   Schizophrenia ([place])   History of alcohol abuse with acute alcohol withdrawal ([place]) -Improving but appears to be somewhat depressed and anxious.   Had some hallucinations overnight -Patient has a history of withdrawal seizures in the past, last alcohol use 1 day before the admission, now at 72 hours this morning since her last drink -CIWA between 1 and 3 this morning, transfer to telemetry -Psychiatry consulted-however recommended social work assistance with inpatient alcohol rehab program if she qualifies.  Patient was admitted 1/3-09/16/17 for the same, social worker had assisted with residential substance rehab places.  However [facility] did not accept her due to history of seizures and [facility] also denied the patient due to her medical issues. -Unfortunately nothing else to offer unless patient goes to inpatient behavioral health.  Patient continues to drink alcohol on daily basis and has been counseled strongly against it. -continue CIWA with Ativan, thiamine, MVI, folate for acute alcohol withdrawals.   Hepatitis C,  chronic ([place]), alcoholic cirrhosis of liver with ascites -Continue Lasix, Spironolactone, Trental, follow electrolytes closely -Currently appears compensated  Hypokalemia -Replaced, potassium 4.1 this morning,  Pancytopenia (HCC) with chronic thrombocytopenia due to alcohol abuse -Platelet count on a down trend for the past couple of days, clamped this morning so unable to get a platelet count today even on repeat. -Repeat platelet count using a citrate tube  Schizophrenia (HCC) -No SI or HI, psychiatry was consulted for medication management during previous admission -Psychiatry consulted on 2/1, per Dr Mariea Clonts, it is patient's decision to make regarding inpatient versus outpatient alcohol rehab and deferred to social work for assistance. -continue Haldol, BuSpar, Topamax  History of seizures: Has been considered alcohol withdrawal seizures in the past -Continue Dilantin per outpatient dose  Urinary urgency/frequency/foul odor -UA negative for UTI -Complains of vaginal discharge today, Diflucan x1  Constipation -Continue bowel regimen    DVT prophylaxis: SCDs Code Status: Full code Family Communication: No family at bedside Disposition Plan: Home within 24-48 hours  Consultants:   None  Procedures:   None   Antimicrobials:  None    Subjective: -Feeling a little bit better, less tremulous however still complains of withdrawal symptoms.  Had hallucinations overnight, has insight into her hallucinations.  Objective: Vitals:   10/11/17 1518 10/11/17 1944 10/12/17 0500 10/12/17 0758  BP: 108/77 106/68  113/75  Pulse:  (!) 105  99  Resp: 16 16  17   Temp: 98.5 F (36.9 C) 99.1 F (37.3 C)  (!) 97.5 F (36.4 C)  TempSrc: Oral Oral  Oral  SpO2:  97%  99%  Weight:   55.5 kg (122 lb 5.7 oz)   Height:  Intake/Output Summary (Last 24 hours) at 10/12/2017 1000 Last data filed at 10/12/2017 0800 Gross per 24 hour  Intake 200 ml  Output -  Net 200 ml    Filed Weights   10/10/17 0500 10/11/17 0513 10/12/17 0500  Weight: 55.6 kg (122 lb 9.2 oz) 53.4 kg (117 lb 11.6 oz) 55.5 kg (122 lb 5.7 oz)    Examination:  Constitutional: Mildly anxious, emotional at times crying Eyes: lids and conjunctivae normal ENMT: Mucous membranes are moist. Neck: normal, supple Respiratory: clear to auscultation bilaterally, no wheezing, no crackles. Normal respiratory effort. Cardiovascular: Regular rate and rhythm, no murmurs / rubs / gallops. No LE edema. Abdomen: no tenderness. Bowel sounds positive.  Skin: no rashes Neurologic: CN 2-12 grossly intact. Strength 5/5 in all 4.  Psychiatric: Normal judgment and insight. Alert and oriented x 3. Normal mood.    Data Reviewed: I have independently reviewed following labs and imaging studies   CBC: Recent Labs  Lab 10/08/17 1228  10/09/17 0257 10/10/17 0244 10/11/17 0231 10/12/17 0324 10/12/17 0739  WBC 2.4*  --  3.6* 2.2* 2.3* 4.3  --   HGB 11.5*  --  10.3* 10.9* 10.7* 10.4*  --   HCT 35.9*  --  31.7* 33.6* 33.5* 32.3*  --   MCV 91.8  --  92.4 92.3 93.1 92.3  --   PLT PLATELET CLUMPING, SUGGEST RECOLLECTION OF SAMPLE IN CITRATE TUBE.   < > 24* 17* 16* PLATELET CLUMPING, SUGGEST RECOLLECTION OF SAMPLE IN CITRATE TUBE. PLATELET CLUMPS NOTED ON SMEAR, COUNT APPEARS ADEQUATE   < > = values in this interval not displayed.   Basic Metabolic Panel: Recent Labs  Lab 10/08/17 1228 10/09/17 0257 10/10/17 0244 10/11/17 0231 10/12/17 0324  NA 139 134* 137 137 134*  K 3.0* 4.0 3.5 3.1* 4.1  CL 107 105 104 104 106  CO2 19* 15* 23 24 19*  GLUCOSE 82 66 101* 116* 91  BUN <5* <5* <5* 5* <5*  CREATININE 0.53 0.55 0.78 0.69 0.67  CALCIUM 8.5* 8.4* 8.8* 8.8* 8.5*  MG  --  2.0 1.9  --   --    GFR: Estimated Creatinine Clearance: 69.4 mL/min (by C-G formula based on SCr of 0.67 mg/dL). Liver Function Tests: Recent Labs  Lab 10/08/17 1228 10/09/17 0257  AST 58* 48*  ALT 19 16  ALKPHOS 176* 156*   BILITOT 1.4* 1.9*  PROT 8.9* 7.9  ALBUMIN 3.2* 2.8*   No results for input(s): LIPASE, AMYLASE in the last 168 hours. No results for input(s): AMMONIA in the last 168 hours. Coagulation Profile: No results for input(s): INR, PROTIME in the last 168 hours. Cardiac Enzymes: Recent Labs  Lab 10/10/17 0950  TROPONINI <0.03   BNP (last 3 results) No results for input(s): PROBNP in the last 8760 hours. HbA1C: No results for input(s): HGBA1C in the last 72 hours. CBG: No results for input(s): GLUCAP in the last 168 hours. Lipid Profile: No results for input(s): CHOL, HDL, LDLCALC, TRIG, CHOLHDL, LDLDIRECT in the last 72 hours. Thyroid Function Tests: No results for input(s): TSH, T4TOTAL, FREET4, T3FREE, THYROIDAB in the last 72 hours. Anemia Panel: No results for input(s): VITAMINB12, FOLATE, FERRITIN, TIBC, IRON, RETICCTPCT in the last 72 hours. Urine analysis:    Component Value Date/Time   COLORURINE YELLOW 10/09/2017 [place] 10/09/2017 1038   LABSPEC 1.016 10/09/2017 1038   PHURINE 5.0 10/09/2017 1038   GLUCOSEU NEGATIVE 10/09/2017 1038   JAARS 10/09/2017 1038  BILIRUBINUR NEGATIVE 10/09/2017 1038   KETONESUR 80 (A) 10/09/2017 1038   PROTEINUR NEGATIVE 10/09/2017 1038   NITRITE NEGATIVE 10/09/2017 [place] 10/09/2017 1038   Sepsis Labs: Invalid input(s): PROCALCITONIN, LACTICIDVEN  Recent Results (from the past 240 hour(s))  MRSA PCR Screening     Status: None   Collection Time: 10/09/17  6:17 AM  Result Value Ref Range Status   MRSA by PCR NEGATIVE NEGATIVE Final    Comment:        The GeneXpert MRSA Assay (FDA approved for NASAL specimens only), is one component of a comprehensive MRSA colonization surveillance program. It is not intended to diagnose MRSA infection nor to guide or monitor treatment for MRSA infections.   Urine Culture     Status: Abnormal   Collection Time: 10/09/17 10:38 AM  Result Value  Ref Range Status   Specimen Description URINE, RANDOM  Final   Special Requests NONE  Final   Culture (A)  Final    <10,000 COLONIES/mL INSIGNIFICANT GROWTH Performed at [facility], [address] [place],  [address]    Report Status 10/11/2017 FINAL  Final      Radiology Studies: No results found.   Scheduled Meds: . busPIRone  7.5 mg Oral BID  . feeding supplement  1 Container Oral TID BM  . folic acid  1 mg Oral Daily  . furosemide  20 mg Oral Daily  . haloperidol  2 mg Oral QHS  . multivitamin with minerals  1 tablet Oral Daily  . pantoprazole  40 mg Oral Daily  . pentoxifylline  400 mg Oral TID WC  . phenytoin  300 mg Oral QHS  . potassium chloride  40 mEq Oral Once  . senna-docusate  1 tablet Oral BID  . sodium chloride flush  3 mL Intravenous Q12H  . sodium chloride flush  3 mL Intravenous Q12H  . sodium chloride flush  3 mL Intravenous Q12H  . spironolactone  50 mg Oral Daily  . thiamine  100 mg Oral Daily  . topiramate  50 mg Oral Daily   Continuous Infusions: . sodium chloride    . sodium chloride       Marzetta Board, MD, PhD [facility] Pager [phone]  If 7PM-7AM, please contact night-coverage [email or web address] Password [email or web address] 10/12/2017, 10:00 AM

## 2017-10-12 NOTE — Progress Notes (Signed)
Received pt to room 6N11, confirmed medsurg level of care with no cardiac monitoring with Dr. Cruzita Lederer.

## 2017-10-13 DIAGNOSIS — K7031 Alcoholic cirrhosis of liver with ascites: Secondary | ICD-10-CM | POA: Diagnosis not present

## 2017-10-13 DIAGNOSIS — F10231 Alcohol dependence with withdrawal delirium: Secondary | ICD-10-CM | POA: Diagnosis not present

## 2017-10-13 LAB — BASIC METABOLIC PANEL
Anion gap: 9 (ref 5–15)
BUN: <5 mg/dL — ABNORMAL LOW (ref 6–20)
CO2: 22 mmol/L (ref 22–32)
Calcium: 8.8 mg/dL — ABNORMAL LOW (ref 8.9–10.3)
Chloride: 106 mmol/L (ref 101–111)
Creatinine, Ser: 0.61 mg/dL (ref 0.44–1.00)
GFR calc Af Amer: >60 mL/min (ref >60)
GFR calc non Af Amer: >60 mL/min (ref >60)
Glucose, Bld: 108 mg/dL — ABNORMAL HIGH (ref 65–99)
Potassium: 3.5 mmol/L (ref 3.5–5.1)
Sodium: 137 mmol/L (ref 135–145)

## 2017-10-13 LAB — CBC
HCT: 33.3 % — ABNORMAL LOW (ref 36.0–46.0)
Hemoglobin: 10.7 g/dL — ABNORMAL LOW (ref 12.0–15.0)
MCH: 29.8 pg (ref 26.0–34.0)
MCHC: 32.1 g/dL (ref 30.0–36.0)
MCV: 92.8 fL (ref 78.0–100.0)
Platelets: 21 10*3/uL — CL (ref 150–400)
RBC: 3.59 MIL/uL — ABNORMAL LOW (ref 3.87–5.11)
RDW: 17.6 % — ABNORMAL HIGH (ref 11.5–15.5)
WBC: 3.5 10*3/uL — ABNORMAL LOW (ref 4.0–10.5)

## 2017-10-13 NOTE — Progress Notes (Signed)
PROGRESS NOTE  Briana Sweeney ZOX:[id number] DOB: June 28, 1971 DOA: 10/08/2017 PCP: Scot Jun, FNP   LOS: 4 days   Brief Narrative / Interim history: 47 year old female with chronic hepatitis C, alcohol dependence, schizophrenia, depression presented to ED with numerous complaints including auditory hallucinations.  Patient reported ongoing alcohol use, half a gallon of vodka every day, denies suicidal or homicidal ideation.  In ED she was noted to be increasingly tachycardiac with heart rate of 130s, visual hallucination.  D-dimer 2.15, CT angiogram negative for PE.  Patient was admitted for acute alcohol withdrawals.   Assessment & Plan: Principal Problem:   Alcohol withdrawal (HCC) Active Problems:   Hepatitis C, chronic (HCC)   Hypokalemia   Pancytopenia (HCC)   Alcoholic cirrhosis of liver with ascites (HCC)   Acute hyperactive alcohol withdrawal delirium (HCC)   Schizophrenia ([place])   History of alcohol abuse with acute alcohol withdrawal ([place]) -Improving but appears to be somewhat depressed and anxious. CIWA score 21 last night, 14 this morning, continue withdrawal protocol.  Clinically she looks better though -Patient has a history of withdrawal seizures in the past, last alcohol use 1 day before the admission, now at 96 hours this morning since her last drink -Psychiatry consulted-however recommended social work assistance with inpatient alcohol rehab program if she qualifies.  Patient was admitted 1/3-09/16/17 for the same, social worker had assisted with residential substance rehab places.  However [facility] did not accept her due to history of seizures and [facility] also denied the patient due to her medical issues. -Unfortunately nothing else to offer unless patient goes to inpatient behavioral health.  Patient continues to drink alcohol on daily basis and has been counseled strongly against it. -continue CIWA with Ativan, thiamine, MVI, folate for acute alcohol withdrawals.    Hepatitis C, chronic ([place]), alcoholic cirrhosis of liver with ascites -Continue Lasix, Spironolactone, Trental, follow electrolytes closely -Currently appears compensated  Hypokalemia -Replaced, potassium 3.5 this morning  Pancytopenia (HCC) with chronic thrombocytopenia due to alcohol abuse -Platelet count on a down trend for the past couple of days, clamped this morning so unable to get a platelet count today even on repeat. -Repeat platelet count 21 this morning, no bleeding, seems to be improving some  Schizophrenia (HCC) -No SI or HI, psychiatry was consulted for medication management during previous admission -Psychiatry consulted on 2/1, per Dr Mariea Clonts, it is patient's decision to make regarding inpatient versus outpatient alcohol rehab and deferred to social work for assistance. -continue Haldol, BuSpar, Topamax  History of seizures: Has been considered alcohol withdrawal seizures in the past -Continue Dilantin per outpatient dose  Urinary urgency/frequency/foul odor -UA negative for UTI -Complains of vaginal discharge, Diflucan x1 on 2/3, ongoing symptoms, patient reports multiple sexual partners, will screen for GC/chlamydia.  She also wants an HIV checked  Constipation -Continue bowel regimen    DVT prophylaxis: SCDs Code Status: Full code Family Communication: No family at bedside Disposition Plan: Home within 24 hours  Consultants:   None  Procedures:   None   Antimicrobials:  None    Subjective: -Overall feeling better, less withdrawals however still had hallucinations overnight, she sees people in the hospital room  Objective: Vitals:   10/12/17 1221 10/12/17 1546 10/12/17 2137 10/13/17 0512  BP: 102/71 110/72 105/71 101/69  Pulse: 97 (!) 106 100 (!) 106  Resp: 18  18 20   Temp: (!) 97.4 F (36.3 C) 99 F (37.2 C) 97.6 F (36.4 C) 98 F (36.7 C)  TempSrc: Oral Oral Axillary  Oral  SpO2: 98% 100% 100% 97%  Weight:      Height:         Intake/Output Summary (Last 24 hours) at 10/13/2017 1128 Last data filed at 10/13/2017 0842 Gross per 24 hour  Intake 120 ml  Output -  Net 120 ml   Filed Weights   10/10/17 0500 10/11/17 0513 10/12/17 0500  Weight: 55.6 kg (122 lb 9.2 oz) 53.4 kg (117 lb 11.6 oz) 55.5 kg (122 lb 5.7 oz)    Examination:  Constitutional: No apparent distress Eyes: No scleral icterus ENMT: Moist mucous membranes Respiratory: Clear to auscultation bilaterally, without wheezing or crackles. Cardiovascular: Regular irregular rhythm, no murmurs.  No edema. Abdomen: Soft, nontender, nondistended.  Bowel sounds positive Neurologic: Nonfocal, ambulatory Psychiatric: Normal judgment and insight. Alert and oriented x 3. Normal mood.    Data Reviewed: I have independently reviewed following labs and imaging studies   CBC: Recent Labs  Lab 10/09/17 0257 10/10/17 0244 10/11/17 0231 10/12/17 0324 10/12/17 0739 10/12/17 1022 10/13/17 0603  WBC 3.6* 2.2* 2.3* 4.3  --   --  3.5*  HGB 10.3* 10.9* 10.7* 10.4*  --   --  10.7*  HCT 31.7* 33.6* 33.5* 32.3*  --   --  33.3*  MCV 92.4 92.3 93.1 92.3  --   --  92.8  PLT 24* 17* 16* PLATELET CLUMPING, SUGGEST RECOLLECTION OF SAMPLE IN CITRATE TUBE. PLATELET CLUMPS NOTED ON SMEAR, COUNT APPEARS ADEQUATE PLATELET CLUMPS NOTED ON SMEAR, COUNT APPEARS DECREASED 21*   Basic Metabolic Panel: Recent Labs  Lab 10/09/17 0257 10/10/17 0244 10/11/17 0231 10/12/17 0324 10/13/17 0603  NA 134* 137 137 134* 137  K 4.0 3.5 3.1* 4.1 3.5  CL 105 104 104 106 106  CO2 15* 23 24 19* 22  GLUCOSE 66 101* 116* 91 108*  BUN <5* <5* 5* <5* <5*  CREATININE 0.55 0.78 0.69 0.67 0.61  CALCIUM 8.4* 8.8* 8.8* 8.5* 8.8*  MG 2.0 1.9  --   --   --    GFR: Estimated Creatinine Clearance: 69.4 mL/min (by C-G formula based on SCr of 0.61 mg/dL). Liver Function Tests: Recent Labs  Lab 10/08/17 1228 10/09/17 0257  AST 58* 48*  ALT 19 16  ALKPHOS 176* 156*  BILITOT 1.4* 1.9*   PROT 8.9* 7.9  ALBUMIN 3.2* 2.8*   No results for input(s): LIPASE, AMYLASE in the last 168 hours. No results for input(s): AMMONIA in the last 168 hours. Coagulation Profile: No results for input(s): INR, PROTIME in the last 168 hours. Cardiac Enzymes: Recent Labs  Lab 10/10/17 0950  TROPONINI <0.03   BNP (last 3 results) No results for input(s): PROBNP in the last 8760 hours. HbA1C: No results for input(s): HGBA1C in the last 72 hours. CBG: No results for input(s): GLUCAP in the last 168 hours. Lipid Profile: No results for input(s): CHOL, HDL, LDLCALC, TRIG, CHOLHDL, LDLDIRECT in the last 72 hours. Thyroid Function Tests: No results for input(s): TSH, T4TOTAL, FREET4, T3FREE, THYROIDAB in the last 72 hours. Anemia Panel: No results for input(s): VITAMINB12, FOLATE, FERRITIN, TIBC, IRON, RETICCTPCT in the last 72 hours. Urine analysis:    Component Value Date/Time   COLORURINE YELLOW 10/09/2017 [place] 10/09/2017 1038   LABSPEC 1.016 10/09/2017 1038   PHURINE 5.0 10/09/2017 1038   GLUCOSEU NEGATIVE 10/09/2017 1038   HGBUR NEGATIVE 10/09/2017 1038   BILIRUBINUR NEGATIVE 10/09/2017 1038   KETONESUR 80 (A) 10/09/2017 [place] 10/09/2017 1038  NITRITE NEGATIVE 10/09/2017 [place] 10/09/2017 1038   Sepsis Labs: Invalid input(s): PROCALCITONIN, LACTICIDVEN  Recent Results (from the past 240 hour(s))  MRSA PCR Screening     Status: None   Collection Time: 10/09/17  6:17 AM  Result Value Ref Range Status   MRSA by PCR NEGATIVE NEGATIVE Final    Comment:        The GeneXpert MRSA Assay (FDA approved for NASAL specimens only), is one component of a comprehensive MRSA colonization surveillance program. It is not intended to diagnose MRSA infection nor to guide or monitor treatment for MRSA infections.   Urine Culture     Status: Abnormal   Collection Time: 10/09/17 10:38 AM  Result Value Ref Range Status    Specimen Description URINE, RANDOM  Final   Special Requests NONE  Final   Culture (A)  Final    <10,000 COLONIES/mL INSIGNIFICANT GROWTH Performed at [facility], [address] [place], [place] [address]    Report Status 10/11/2017 FINAL  Final      Radiology Studies: No results found.   Scheduled Meds: . busPIRone  7.5 mg Oral BID  . feeding supplement  1 Container Oral TID BM  . folic acid  1 mg Oral Daily  . furosemide  20 mg Oral Daily  . haloperidol  2 mg Oral QHS  . multivitamin with minerals  1 tablet Oral Daily  . pantoprazole  40 mg Oral Daily  . pentoxifylline  400 mg Oral TID WC  . phenytoin  300 mg Oral QHS  . potassium chloride  40 mEq Oral Once  . senna-docusate  1 tablet Oral BID  . sodium chloride flush  3 mL Intravenous Q12H  . sodium chloride flush  3 mL Intravenous Q12H  . sodium chloride flush  3 mL Intravenous Q12H  . spironolactone  50 mg Oral Daily  . thiamine  100 mg Oral Daily  . topiramate  50 mg Oral Daily   Continuous Infusions: . sodium chloride    . sodium chloride       Marzetta Board, MD, PhD [facility] Pager [phone]  If 7PM-7AM, please contact night-coverage [email or web address] Password [email or web address] 10/13/2017, 11:28 AM

## 2017-10-13 NOTE — Progress Notes (Signed)
CSW met with pt at pt request  Pt inquiring about housing/shelter resources- does not want to return to living with housemate in [place] due to his erratic behaviors.  CSW provided pt with list of [facility]- she does not have income at this time so unable to pursue housing till that gets approved.  CSW also inquired about alcohol use- only close facility that might be an option is [facility]- CSW faxed referral with pt permission for their review to see if pt is medically approved- awaiting response  Jorge Ny, [place] Social Worker [phone]

## 2017-10-13 NOTE — Progress Notes (Signed)
IV began leaking when nurse tried to push PRN ativan.  Nurse made 2 failed attempts to start new peripheral IV.  IV team consult requested.  Wasted 0.12mL of ativan in med room A sink with Jolayne Panther, RN as witness.

## 2017-10-14 ENCOUNTER — Inpatient Hospital Stay (HOSPITAL_COMMUNITY): Payer: Self-pay

## 2017-10-14 DIAGNOSIS — F10231 Alcohol dependence with withdrawal delirium: Secondary | ICD-10-CM | POA: Diagnosis not present

## 2017-10-14 DIAGNOSIS — K7031 Alcoholic cirrhosis of liver with ascites: Secondary | ICD-10-CM

## 2017-10-14 LAB — GC/CHLAMYDIA PROBE AMP (~~LOC~~) NOT AT ARMC
Chlamydia: NEGATIVE
Neisseria Gonorrhea: NEGATIVE

## 2017-10-14 LAB — HIV ANTIBODY (ROUTINE TESTING W REFLEX): HIV Screen 4th Generation wRfx: NONREACTIVE

## 2017-10-14 MED ORDER — PENTOXIFYLLINE ER 400 MG PO TBCR: 400.0000 mg | EXTENDED_RELEASE_TABLET | Freq: Three times a day (TID) | ORAL | 0 refills | Status: DC

## 2017-10-14 MED ORDER — HYDROCODONE-ACETAMINOPHEN 5-325 MG PO TABS: 1.0000 | ORAL_TABLET | Freq: Four times a day (QID) | ORAL | 0 refills | Status: DC | PRN

## 2017-10-14 MED ORDER — PHENYTOIN SODIUM EXTENDED 100 MG PO CAPS: 300.0000 mg | ORAL_CAPSULE | Freq: Every day | ORAL | 0 refills | Status: DC

## 2017-10-14 MED ORDER — CHLORDIAZEPOXIDE HCL 5 MG PO CAPS: 10.0000 mg | ORAL_CAPSULE | Freq: Two times a day (BID) | ORAL | Status: DC

## 2017-10-14 MED ORDER — TOPIRAMATE 50 MG PO TABS: 50.0000 mg | ORAL_TABLET | Freq: Every day | ORAL | 0 refills | Status: DC

## 2017-10-14 MED ORDER — FOLIC ACID 1 MG PO TABS: 1.0000 mg | ORAL_TABLET | Freq: Every day | ORAL | 1 refills | Status: DC

## 2017-10-14 MED ORDER — CHLORDIAZEPOXIDE HCL 10 MG PO CAPS: ORAL_CAPSULE | ORAL | 0 refills | Status: DC

## 2017-10-14 MED ORDER — HALOPERIDOL 2 MG PO TABS: 2.0000 mg | ORAL_TABLET | Freq: Every day | ORAL | 0 refills | Status: DC

## 2017-10-14 MED ORDER — BUSPIRONE HCL 7.5 MG PO TABS: 7.5000 mg | ORAL_TABLET | Freq: Two times a day (BID) | ORAL | 0 refills | Status: DC

## 2017-10-14 MED ORDER — PANTOPRAZOLE SODIUM 40 MG PO TBEC: 40.0000 mg | DELAYED_RELEASE_TABLET | Freq: Every day | ORAL | 2 refills | Status: DC

## 2017-10-14 MED ORDER — SPIRONOLACTONE 50 MG PO TABS: 50.0000 mg | ORAL_TABLET | Freq: Every day | ORAL | 2 refills | Status: DC

## 2017-10-14 MED ORDER — THIAMINE HCL 100 MG PO TABS: 100.0000 mg | ORAL_TABLET | Freq: Every day | ORAL | 1 refills | Status: DC

## 2017-10-14 NOTE — Discharge Summary (Signed)
Physician Discharge Summary   Patient ID: Briana Sweeney MRN: [id number] DOB/AGE: [birth year] 47 y.o.  Admit date: 10/08/2017 Discharge date: 10/14/2017  Primary Care Physician:  Scot Jun, FNP  Discharge Diagnoses:    . Alcoholic cirrhosis of liver with ascites ([place]) . Hepatitis C, chronic ([place]) . Pancytopenia ([place]) . Hypokalemia . Alcohol withdrawal ([place]) . Acute hyperactive alcohol withdrawal delirium ([place]) . Schizophrenia ([place])   Consults:  none  Recommendations for Outpatient Follow-up:  1. Social work assisted patient [employer] and taxi voucher 2. Please repeat CBC/BMET at next visit   DIET: Heart healthy diet    Allergies:  No Known Allergies   DISCHARGE MEDICATIONS: Allergies as of 10/14/2017   No Known Allergies     Medication List    TAKE these medications   busPIRone 7.5 MG tablet Commonly known as:  BUSPAR Take 1 tablet (7.5 mg total) by mouth 2 (two) times daily.   chlordiazePOXIDE 10 MG capsule Commonly known as:  LIBRIUM Take librium 10mg  twice a day for 2 days, then daily for 3 days then OFF   folic acid 1 MG tablet Commonly known as:  FOLVITE Take 1 tablet (1 mg total) by mouth daily. Start taking on:  10/15/2017   furosemide 20 MG tablet Commonly known as:  LASIX Take 1 tablet (20 mg total) by mouth daily.   haloperidol 2 MG tablet Commonly known as:  HALDOL Take 1 tablet (2 mg total) by mouth at bedtime.   HYDROcodone-acetaminophen 5-325 MG tablet Commonly known as:  NORCO/VICODIN Take 1 tablet by mouth every 6 (six) hours as needed for moderate pain.   pantoprazole 40 MG tablet Commonly known as:  PROTONIX Take 1 tablet (40 mg total) by mouth daily.   pentoxifylline 400 MG CR tablet Commonly known as:  TRENTAL Take 1 tablet (400 mg total) by mouth 3 (three) times daily with meals.   phenytoin 100 MG ER capsule Commonly known as:  DILANTIN Take 3 capsules (300 mg total) by mouth at bedtime.   spironolactone 50 MG  tablet Commonly known as:  ALDACTONE Take 1 tablet (50 mg total) by mouth daily.   thiamine 100 MG tablet Take 1 tablet (100 mg total) by mouth daily.   topiramate 50 MG tablet Commonly known as:  TOPAMAX Take 1 tablet (50 mg total) by mouth daily.        Brief H and P: For complete details please refer to admission H and P, but in brief 47 year old female with chronic hepatitis C, alcohol dependence, schizophrenia, depression presented to ED with numerous complaints including auditory hallucinations. Patient reported ongoing alcohol use, half a gallon of vodka every day, denies suicidal or homicidal ideation. In ED she was noted to be increasingly tachycardiac with heart rate of 130s, visual hallucination. D-dimer 2.15, CT angiogram negative for PE. Patient was admitted for acute alcohol withdrawals.  Hospital Course:  History of alcohol abuse with acute alcohol withdrawal ([place]) -Improving, patient alert and oriented, CIWA 3 and 7 today separately, has anxiety issues -Patient has a history of withdrawal seizures in the past, last alcohol use 1 day before the admission, now out of alcohol withdrawal window. -Psychiatry was consulted-however recommended social work assistance with inpatient alcohol rehab program if she qualifies.Patient was admitted 1/3-1/8/19for the same, social worker had assisted with residential substance rehab places. However [facility] did not accept her due to history of seizures and[facility] due to [facility] issues. -Patient was placed on CIWAwith Ativan, thiamine, MVI,  folate for acute alcohol withdrawals. - She is now clinically improved.-  - Patient was given prescription for Librium taper in the next 5 days.  10 mg twice a day for 2 days, then 10 mg daily for 3 days then off.  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  She needs to have a referral for outpatient hematology, follow CBC outpatient.  Schizophrenia ([place]) -No SI or HI, psychiatry was consulted for medication management during previous admission -Psychiatry consultedon 2/1, per Dr Sheliah Plane is patient's decision to make regarding inpatient versus outpatient alcohol rehab and deferred to social work for assistance. -continue Haldol, BuSpar, Topamax  History of seizures: Has been considered alcohol withdrawal seizures in the past -Continue Dilantin per outpatient dose  Urinary urgency/frequency/foul odor -UA negative for UTI -HIV negative.  Received Diflucan x1 on 2/3.   -GC chlamydia pending, needs to be followed by PCP.  Constipation -Continue bowel regimen  Lower back pain -No red flag complaints, x-ray of the thoracic and lumbar spine with no acute abnormality.  Day of Discharge BP 102/66   Pulse 92   Temp 98.4 F (36.9 C)   Resp 17   Ht 5' (1.524 m)   Wt 57.1 kg (125 lb 14.1 oz)   LMP  (LMP Unknown)   SpO2 99%   BMI 24.58 kg/m   Physical Exam: General: Alert and awake oriented x3 not in any acute distress. HEENT: anicteric sclera, pupils reactive to light and accommodation CVS: S1-S2 clear no murmur rubs or gallops Chest: clear to auscultation bilaterally, no wheezing rales or rhonchi Abdomen: soft nontender, nondistended, normal bowel sounds Extremities: no cyanosis, clubbing or edema noted bilaterally Neuro: Cranial nerves II-XII intact, no focal neurological deficits   The results of significant diagnostics from this hospitalization (including imaging, microbiology, ancillary and laboratory) are listed below for reference.    LAB RESULTS: Basic Metabolic Panel: Recent Labs  Lab 10/10/17 0244  10/12/17 0324 10/13/17 0603  NA 137   < > 134*  137  K 3.5   < > 4.1 3.5  CL 104   < > 106 106  CO2 23   < > 19* 22  GLUCOSE 101*   < > 91 108*  BUN <5*   < > <5* <5*  CREATININE 0.78   < > 0.67 0.61  CALCIUM 8.8*   < > 8.5* 8.8*  MG 1.9  --   --   --    < > = values in this interval not displayed.   Liver Function Tests: Recent Labs  Lab 10/08/17 1228 10/09/17 0257  AST 58* 48*  ALT 19 16  ALKPHOS 176* 156*  BILITOT 1.4* 1.9*  PROT 8.9* 7.9  ALBUMIN 3.2* 2.8*   No results for input(s): LIPASE, AMYLASE in the last 168 hours. No results for input(s): AMMONIA in the last 168 hours. CBC: Recent Labs  Lab 10/12/17 0324  10/12/17 1022 10/13/17 0603  WBC 4.3  --   --  3.5*  HGB 10.4*  --   --  10.7*  HCT 32.3*  --   --  33.3*  MCV 92.3  --   --  92.8  PLT PLATELET CLUMPING, SUGGEST RECOLLECTION OF SAMPLE IN CITRATE TUBE.   < > PLATELET CLUMPS NOTED ON SMEAR, COUNT APPEARS DECREASED 21*   < > =  values in this interval not displayed.   Cardiac Enzymes: Recent Labs  Lab 10/10/17 0950  TROPONINI <0.03   BNP: Invalid input(s): POCBNP CBG: No results for input(s): GLUCAP in the last 168 hours.  Significant Diagnostic Studies:  Dg Chest 2 View  Result Date: 10/08/2017 CLINICAL DATA:  Right chest pain. Neck and back pain following a fall several weeks ago. EXAM: CHEST  2 VIEW COMPARISON:  07/19/2017.  Abdomen and pelvis CT dated 07/24/2017. FINDINGS: Normal sized heart. Clear lungs. Small calcified granuloma at the left lung base and calcified left hilar lymph nodes. Unremarkable bones. Right shoulder fixation anchors. IMPRESSION: No acute abnormality. Electronically Signed   By: Claudie Revering M.D.   On: 10/08/2017 18:49   Ct Angio Chest Pe W/cm &/or Wo Cm  Result Date: 10/08/2017 CLINICAL DATA:  Shortness of breath and cough for 2 weeks. History of DVT. Positive D-dimer. EXAM: CT ANGIOGRAPHY CHEST WITH CONTRAST TECHNIQUE: Multidetector CT imaging of the chest was performed using the standard protocol during bolus  administration of intravenous contrast. Multiplanar CT image reconstructions and MIPs were obtained to evaluate the vascular anatomy. CONTRAST:  137mL ISOVUE-370 IOPAMIDOL (ISOVUE-370) INJECTION 76% COMPARISON:  Chest radiograph 10/08/2017 FINDINGS: Cardiovascular: Moderately good opacification of the central and proximal segmental pulmonary arteries. No filling defects. No evidence of significant pulmonary embolus. Normal heart size. No pericardial effusion. Normal caliber thoracic aorta. Great vessel origins are patent. Mediastinum/Nodes: No enlarged mediastinal, hilar, or axillary lymph nodes. Thyroid gland, trachea, and esophagus demonstrate no significant findings. Lungs/Pleura: Lungs are clear. No pleural effusion or pneumothorax. Calcified granuloma in the left lung base. Upper Abdomen: Diffuse fatty infiltration of the liver. Calcified granulomas in the spleen. No acute process identified. Musculoskeletal: No chest wall abnormality. No acute or significant osseous findings. Review of the MIP images confirms the above findings. IMPRESSION: 1. No evidence of significant pulmonary embolus. 2. No evidence of active pulmonary disease. 3. Diffuse fatty infiltration of the liver. Electronically Signed   By: Lucienne Capers M.D.   On: 10/08/2017 22:24    2D ECHO:   Disposition and Follow-up: Discharge Instructions    Diet - low sodium heart healthy   Complete by:  As directed    Increase activity slowly   Complete by:  As directed        DISPOSITION: [place], FNP Follow up.   Specialty:  Family Medicine Why:  Appointment 10/21/17 at 1 pm  Contact information: [place] [id number] [phone]            Time spent on Discharge: 25-minutes  Signed:   Estill Cotta M.D. [facility] 10/14/2017, 12:47 PM Pager: [phone]

## 2017-10-14 NOTE — Care Management Note (Signed)
Case Management Note  Patient Details  Name: Briana Sweeney MRN: [id number] Date of Birth: 1971-07-12  Subjective/Objective:                    Action/Plan:  Patient not eligible for [facility] letter . Patient already has appointment scheduled at [facility] 10-21-17 at 1 pm. Expected Discharge Date:  10/14/17               Expected Discharge Plan:  Home/Self Care  In-House Referral:  Clinical Social Work  Discharge planning Services  CM Consult, [facility]  Post Acute Care Choice:  NA Choice offered to:  Patient  DME Arranged:  N/A DME Agency:  NA  HH Arranged:  NA HH Agency:  NA  Status of Service:  Completed, signed off  If discussed at [employer], dates discussed:    Additional Comments:  Marilu Favre, RN 10/14/2017, 10:41 AM

## 2017-10-14 NOTE — Social Work (Addendum)
CSW alerted that pt will need a taxi voucher to get to [facility] after xrays complete. CSW to f/u with pt with support prior to discharge.   12:30pm- CSW met with pt at bedside. Pt states gratitude for CSW support with taxi voucher, states that she hopes to be able to find housing in the future in [place] to make it to MD appointments. CSW left taxi voucher with RN for pt.     CSW signing off. Please consult if any additional needs arise.   H , LCSWA [place] Clinical Social Work [phone]   

## 2017-10-15 ENCOUNTER — Emergency Department (HOSPITAL_COMMUNITY): Payer: Self-pay

## 2017-10-15 ENCOUNTER — Encounter (HOSPITAL_COMMUNITY): Payer: Self-pay | Admitting: Emergency Medicine

## 2017-10-15 DIAGNOSIS — K769 Liver disease, unspecified: Secondary | ICD-10-CM | POA: Insufficient documentation

## 2017-10-15 DIAGNOSIS — R072 Precordial pain: Secondary | ICD-10-CM | POA: Insufficient documentation

## 2017-10-15 DIAGNOSIS — Z79899 Other long term (current) drug therapy: Secondary | ICD-10-CM | POA: Insufficient documentation

## 2017-10-15 LAB — RAPID URINE DRUG SCREEN, HOSP PERFORMED
Amphetamines: NOT DETECTED
Barbiturates: NOT DETECTED
Benzodiazepines: POSITIVE — AB
Cocaine: NOT DETECTED
Opiates: NOT DETECTED
Tetrahydrocannabinol: NOT DETECTED

## 2017-10-15 LAB — CBC
HCT: 36.3 % (ref 36.0–46.0)
Hemoglobin: 11.6 g/dL — ABNORMAL LOW (ref 12.0–15.0)
MCH: 29.6 pg (ref 26.0–34.0)
MCHC: 32 g/dL (ref 30.0–36.0)
MCV: 92.6 fL (ref 78.0–100.0)
Platelets: DECREASED 10*3/uL (ref 150–400)
RBC: 3.92 MIL/uL (ref 3.87–5.11)
RDW: 18.2 % — ABNORMAL HIGH (ref 11.5–15.5)
WBC: 4.1 10*3/uL (ref 4.0–10.5)

## 2017-10-15 LAB — BASIC METABOLIC PANEL
Anion gap: 13 (ref 5–15)
BUN: 5 mg/dL — ABNORMAL LOW (ref 6–20)
CO2: 21 mmol/L — ABNORMAL LOW (ref 22–32)
Calcium: 9.3 mg/dL (ref 8.9–10.3)
Chloride: 103 mmol/L (ref 101–111)
Creatinine, Ser: 0.61 mg/dL (ref 0.44–1.00)
GFR calc Af Amer: >60 mL/min (ref >60)
GFR calc non Af Amer: >60 mL/min (ref >60)
Glucose, Bld: 109 mg/dL — ABNORMAL HIGH (ref 65–99)
Potassium: 3.3 mmol/L — ABNORMAL LOW (ref 3.5–5.1)
Sodium: 137 mmol/L (ref 135–145)

## 2017-10-15 LAB — I-STAT TROPONIN, ED: Troponin i, poc: 0 ng/mL (ref 0.00–0.08)

## 2017-10-15 LAB — I-STAT BETA HCG BLOOD, ED (MC, WL, AP ONLY): I-stat hCG, quantitative: <5 m[IU]/mL (ref <5)

## 2017-10-15 LAB — ETHANOL: Alcohol, Ethyl (B): <10 mg/dL (ref <10)

## 2017-10-15 NOTE — ED Triage Notes (Addendum)
Pt BIB [facility] from shelter, pt c/o withdrawals. EMS reports pt hallucinating, c/o chest pain. Pt drank purex, used "ice and GHB" 24hours ago. GIven 324 aspirin PTA.

## 2017-10-16 ENCOUNTER — Emergency Department (HOSPITAL_COMMUNITY)
Admission: EM | Admit: 2017-10-16 | Discharge: 2017-10-16 | Disposition: A | Discharge status: Home / Self Care | Payer: Self-pay | Attending: Emergency Medicine | Admitting: Emergency Medicine

## 2017-10-16 DIAGNOSIS — R072 Precordial pain: Secondary | ICD-10-CM

## 2017-10-16 DIAGNOSIS — K769 Liver disease, unspecified: Secondary | ICD-10-CM

## 2017-10-16 LAB — HEPATIC FUNCTION PANEL
ALT: 15 U/L (ref 14–54)
AST: 33 U/L (ref 15–41)
Albumin: 3.3 g/dL — ABNORMAL LOW (ref 3.5–5.0)
Alkaline Phosphatase: 174 U/L — ABNORMAL HIGH (ref 38–126)
Bilirubin, Direct: 0.4 mg/dL (ref 0.1–0.5)
Indirect Bilirubin: 1.1 mg/dL — ABNORMAL HIGH (ref 0.3–0.9)
Total Bilirubin: 1.5 mg/dL — ABNORMAL HIGH (ref 0.3–1.2)
Total Protein: 9.2 g/dL — ABNORMAL HIGH (ref 6.5–8.1)

## 2017-10-16 LAB — SALICYLATE LEVEL: Salicylate Lvl: <7 mg/dL (ref 2.8–30.0)

## 2017-10-16 LAB — VOLATILES,BLD-ACETONE,ETHANOL,ISOPROP,METHANOL
Acetone, blood: NEGATIVE % (ref 0.000–0.010)
Ethanol, blood: NEGATIVE % (ref 0.000–0.010)
Isopropanol, blood: NEGATIVE % (ref 0.000–0.010)
Methanol, blood: NEGATIVE % (ref 0.000–0.010)

## 2017-10-16 LAB — ACETAMINOPHEN LEVEL: Acetaminophen (Tylenol), Serum: <10 ug/mL — ABNORMAL LOW (ref 10–30)

## 2017-10-16 LAB — AMMONIA: Ammonia: 55 umol/L — ABNORMAL HIGH (ref 9–35)

## 2017-10-16 NOTE — Discharge Instructions (Signed)
Substance Abuse Treatment Programs ° °Intensive Outpatient Programs °[facility]     °[address]      °[place], [place]                   °[phone]      ° °[facility] °[address] °[place], [place] °[phone] ° °[facility]     °(Inpatient and outpatient)     °[address]           °[phone]   ° °[facility] °[phone] (Suboxone and Methadone) ° °[address]      °[place], [place] [address]      °336-882-2125      ° °[address] °[place], [place] °852-3033 ° °[facility] (Outpatient/Inpatient, [facility])    °(insurance only) [phone]      °       °[facility] (Groups & Residential) °[place], [place] °[phone] ° °   °[facility]     °[address]     °[place], [place]      °[phone]      ° °[facility] (for caregivers and family) °[address] °[place], [place] °336-299-4655 ° ° ° ° ° °Residential Treatment Programs °[facility]      °[address], [place], [place] [address]  °[phone]      ° °[facility] °[address] , [place] [address] °[phone] ° °[facility]        °336-248-8914      ° °[facility] °[phone] ° °[facility]             °[address]                                         °[place], [place]                                                °[phone] or 336-784-9470                              ° °[facility] [place] °[address] °[place], [address] °[phone] ° °[facility]    °[address]      °[place], [place]     °[phone]      ° °[facility] °[address] °[place], [place] °336-285-9073 ° °[facility]   °[address]     °[place], [place] [address]     °[phone]      °Admissions: 8am-3pm M-F ° °[facility] °[address] °[place] °336-227-7417 ° °[facility]: Residential Program (90 Days)   °[place], [place]      °336-725-8389 or [phone]    ° °[facility] °[place],  °(Walk in Hours over the weekend or by referral) ° °[facility] °[address], [place],  [address] °([phone] ° °Crisis Mobile: [facility]:  1-877-626-1772 (for crisis response 24 hours a day) °[facility] Hotline:      1-800-256-2452 °Outpatient Psychiatry and Counseling ° °[facility]: Mobile Crisis   Management 24 hours:  1-877-626-1772 ° °[facility] sliding scale fee and walk in schedule: M-F 8am-12pm/1pm-3pm °[address]  °[place], [place] [address] °[phone] ° °[facility] °[address] °[place], [place] [address] °336-703-9650 ° °[facility] ([facility])- new patient walk-in appointments available Monday - Friday 8am -3pm.          °[address] °[place], [place] [address] °336-676-6840 or crisis line- 336-676-6905 ° °[facility]/ Intensive Outpatient Therapy Program °[address] °[place], [place] [address] °336-832-9804 ° °[facility]                  °Crisis Services      °[phone]      °[address]     °[place], [place] [address]                ° °[facility]   °[facility] °[phone] °[address] °[place], [place] [address] ° ° °[facility]          °[address]  °[place], [place] [address]       °[phone] ° °[facility] °[address] °[place], [place] [address] °336-292-1510 ° °[facility]    °[address]    °[place], [place] [address]     °[phone]      ° °Parish McKinney, MD     °[address]     °[place] [address]     °336-282-1251     °  °[facility] °[address] °[place] [address] ° °[facility]     °[address]     °[place], [place]      °336-542-2076      ° °[facility] °Shamsher Ahluwalia, MD °[address] °[place], [place] [address] °[phone] ° °[facility]     °[address]     °[place], [place] [address]     °336-274-1237      ° °[facility] °[address] °[place], [place] [address] °336-854-4450 °Resources for Temporary Residential Assistance/Crisis Centers ° °DAY CENTERS °[facility] °M-F 8am-3pm   °[address] [place], [place] [address]   336-332-0824 °Services include: laundry, barbering, support groups, case management, phone  & computer access, showers, AA/NA mtgs, mental health/substance abuse nurse, job skills class, disability information, VA assistance, spiritual classes, etc.  ° °HOMELESS SHELTERS ° °[facility]     °[facility]   °[address], [place]     °[phone]       °       °[facility] (women and children)       °[address] °[place], [place] [address] °336-275-0820 °[email or web address] for application and process °Application Required ° °[facility]   °[address]    °[place] [address]     °[phone]       °             °[facility] °[address] °[place], [place] [address] °336.273.5572 °336-235-0363(schedule application appt.) °Application Required ° °[facility] (women only)    °[address]     °[place],  [address]     °336-884-1039      °  Intake starts 6pm daily °Need valid ID, SSC, & Police report °[facility] [place] °[address] °[place], [place] °336-881-5420 °Application Required ° °[facility] (men only)     °[address]      °[place], [place]     °[phone]      ° °[facility] °(Pregnant women only) °[address] °[place], [place] °336-275-0206 ° °[facility]      °[address]      °[place], [place] [address]     °336-722-9951      °       °[facility] °[address] °[place], [place] °336-723-1848 °90 day commitment/SA/Application process ° °[facility](men only)     °[address]     °[place], [place]     °[phone]       °Check-in at 7pm     °       °[facility] °[address] °[place], [place] [address] °336-248-6684 °Men/Women/Women and Children must be there by 7 pm ° °[facility] °[place], [place] °336-722-8721                ° °

## 2017-10-16 NOTE — ED Provider Notes (Signed)
[facility] EMERGENCY DEPARTMENT Provider Note   CSN: [id number] Arrival date & time: 10/15/17  2056     History   Chief Complaint Chief Complaint  Patient presents with  . Withdrawal    HPI Briana Sweeney is a 47 y.o. female.  The history is provided by the patient.  Illness  This is a new problem. The current episode started yesterday. The problem occurs constantly. The problem has not changed since onset.Associated symptoms include abdominal pain. Associated symptoms comments: Chest wall pain . Nothing relieves the symptoms.  Patient presents via EMS for multiple complaints.  She had felt like she was withdrawing from alcohol.  Last drink was over 3 days ago.  She also reports right-sided chest wall pain as well.  She also reports several days ago she did ingest hand sanitizer.  Denies any recent drug use.  Denies any suicidal ideation.  She also reports back pain.  She also reports generalized weakness. Denies any recent falls or trauma. Past Medical History:  Diagnosis Date  . Anxiety   . Bipolar affective disorder ([place])    With anxiety features  . 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 Depression   . GERD (gastroesophageal reflux disease)   . Hepatitis C 2018   hepatitis c and alcohol related hepatitis  . History of blood transfusion    "blood doesn't clot; I fell down and had to have a transfusion"  . History of kidney stones   . Migraine    "when I get really stressed" (09/01/2017)  . Schizophrenia ([place])   . Seizures ([place])    "when I run out of my RX; lots recently" (09/01/2017)    Patient Active Problem List   Diagnosis Date Noted  . Acute hyperactive alcohol withdrawal delirium ([place]) 10/09/2017  . Schizophrenia ([place]) 10/09/2017  . Alcohol abuse with alcohol-induced mood disorder ([place])   . Alcohol withdrawal ([place]) 09/11/2017  . Esophageal varices without bleeding ([place])   . Hematemesis 09/01/2017  . Ascites due to  alcoholic cirrhosis ([place])   . Decompensated hepatic cirrhosis ([place]) 07/23/2017  . Alcohol abuse 07/23/2017  . Hepatitis C, chronic ([place]) 07/23/2017  . Hypokalemia 07/23/2017  . Jaundice 07/23/2017  . Coagulopathy ([place]) 07/23/2017  . Hypomagnesemia 07/23/2017  . Pancytopenia ([place]) 07/23/2017  . Alcoholic cirrhosis of liver with ascites ([place]) 07/23/2017    Past Surgical History:  Procedure Laterality Date  . ESOPHAGOGASTRODUODENOSCOPY N/A 09/03/2017   Procedure: ESOPHAGOGASTRODUODENOSCOPY (EGD);  Surgeon: Doran Stabler, MD;  Location: [facility];  Service: Gastroenterology;  Laterality: N/A;  . FINGER FRACTURE SURGERY Left    "shattered my pinky"  . FRACTURE SURGERY    . IR PARACENTESIS  07/23/2017  . IR PARACENTESIS  07/2017   "did it twice in the same week" (09/01/2017)  . SHOULDER OPEN ROTATOR CUFF REPAIR Right   . TUBAL LIGATION    . VAGINAL HYSTERECTOMY      OB History    No data available       Home Medications    Prior to Admission medications   Medication Sig Start Date End Date Taking? Authorizing Provider  busPIRone (BUSPAR) 7.5 MG tablet Take 1 tablet (7.5 mg total) by mouth 2 (two) times daily. 10/14/17  Yes Rai, Ripudeep K, MD  folic acid (FOLVITE) 1 MG tablet Take 1 tablet (1 mg total) by mouth daily. 10/15/17  Yes Rai, Ripudeep K, MD  furosemide (LASIX) 20 MG tablet Take  1 tablet (20 mg total) by mouth daily. 09/16/17  Yes Rai, Ripudeep K, MD  haloperidol (HALDOL) 2 MG tablet Take 1 tablet (2 mg total) by mouth at bedtime. 10/14/17  Yes Rai, Ripudeep K, MD  HYDROcodone-acetaminophen (NORCO/VICODIN) 5-325 MG tablet Take 1 tablet by mouth every 6 (six) hours as needed for moderate pain. 10/14/17  Yes Rai, Ripudeep K, MD  pantoprazole (PROTONIX) 40 MG tablet Take 1 tablet (40 mg total) by mouth daily. 10/14/17  Yes Rai, Ripudeep K, MD  pentoxifylline (TRENTAL) 400 MG CR tablet Take 1 tablet (400 mg total) by mouth 3 (three) times daily with meals. 10/14/17  Yes Rai,  Ripudeep K, MD  phenytoin (DILANTIN) 100 MG ER capsule Take 3 capsules (300 mg total) by mouth at bedtime. 10/14/17  Yes Rai, Ripudeep K, MD  spironolactone (ALDACTONE) 50 MG tablet Take 1 tablet (50 mg total) by mouth daily. 10/14/17  Yes Rai, Ripudeep K, MD  thiamine 100 MG tablet Take 1 tablet (100 mg total) by mouth daily. 10/14/17  Yes Rai, Ripudeep K, MD  topiramate (TOPAMAX) 50 MG tablet Take 1 tablet (50 mg total) by mouth daily. 10/14/17  Yes Rai, Ripudeep K, MD  chlordiazePOXIDE (LIBRIUM) 10 MG capsule Take librium 10mg  twice a day for 2 days, then daily for 3 days then OFF Patient not taking: Reported on 10/16/2017 10/14/17   Mendel Corning, MD    Family History Family History  Problem Relation Age of Onset  . Lung cancer Mother 64  . Alcohol abuse Mother   . Throat cancer Father 58    Social History Social History   Tobacco Use  . Smoking status: Never Smoker  . Smokeless tobacco: Never Used  Substance Use Topics  . Alcohol use: Yes    Alcohol/week: 268.8 oz    Types: 448 Shots of liquor per week    Comment: 09/01/2017 "maybe 1/2 gallon vodka/day"  . Drug use: No     Allergies   Patient has no known allergies.   Review of Systems Review of Systems  Constitutional: Positive for fatigue.  Cardiovascular:       Chest wall pain  Gastrointestinal: Positive for abdominal pain.  Musculoskeletal: Positive for back pain.  Psychiatric/Behavioral: Negative for suicidal ideas.  All other systems reviewed and are negative.    Physical Exam Updated Vital Signs BP 117/87   Pulse 87   Temp 99.3 F (37.4 C) (Oral)   Resp 16   LMP  (LMP Unknown)   SpO2 98%   Physical Exam CONSTITUTIONAL: Chronically ill-appearing, no acute distress HEAD: Normocephalic/atraumatic EYES: EOMI/PERRL, no icterus ENMT: Mucous membranes moist NECK: supple no meningeal signs SPINE/BACK:entire spine nontender, No bruising/crepitance/stepoffs noted to spine CV: S1/S2 noted, no  murmurs/rubs/gallops noted LUNGS: Lungs are clear to auscultation bilaterally, no apparent distress Chest -diffuse right-sided chest wall tenderness, no bruising noted. ABDOMEN: soft, nontender, no rebound or guarding, bowel sounds noted throughout abdomen, no distention GU:no cva tenderness NEURO: Pt is awake/alert/appropriate, moves all extremitiesx4.  No facial droop.  No tremor noted.  No ataxia noted EXTREMITIES: pulses normal/equal, full ROM SKIN: warm, color normal PSYCH: no abnormalities of mood noted, alert and oriented to situation   ED Treatments / Results  Labs (all labs ordered are listed, but only abnormal results are displayed) Labs Reviewed  BASIC METABOLIC PANEL - Abnormal; Notable for the following components:      Result Value   Potassium 3.3 (*)    CO2 21 (*)    Glucose,  Bld 109 (*)    BUN 5 (*)    All other components within normal limits  CBC - Abnormal; Notable for the following components:   Hemoglobin 11.6 (*)    RDW 18.2 (*)    All other components within normal limits  RAPID URINE DRUG SCREEN, HOSP PERFORMED - Abnormal; Notable for the following components:   Benzodiazepines POSITIVE (*)    All other components within normal limits  AMMONIA - Abnormal; Notable for the following components:   Ammonia 55 (*)    All other components within normal limits  HEPATIC FUNCTION PANEL - Abnormal; Notable for the following components:   Total Protein 9.2 (*)    Albumin 3.3 (*)    Alkaline Phosphatase 174 (*)    Total Bilirubin 1.5 (*)    Indirect Bilirubin 1.1 (*)    All other components within normal limits  ACETAMINOPHEN LEVEL - Abnormal; Notable for the following components:   Acetaminophen (Tylenol), Serum <10 (*)    All other components within normal limits  ETHANOL  SALICYLATE LEVEL  VOLATILES,BLD-ACETONE,ETHANOL,ISOPROP,METHANOL  I-STAT TROPONIN, ED  I-STAT BETA HCG BLOOD, ED ([facility], [facility], [facility] ONLY)    EKG  EKG Interpretation  Date/Time:  Wednesday  October 15 2017 21:05:40 EST Ventricular Rate:  115 PR Interval:  158 QRS Duration: 58 QT Interval:  314 QTC Calculation: 434 R Axis:   69 Text Interpretation:  Sinus tachycardia Low voltage QRS Borderline ECG Confirmed by Ripley Fraise [phone]) on 10/16/2017 6:20:39 AM       Radiology Dg Chest 2 View  Result Date: 10/15/2017 CLINICAL DATA:  47 y/o F; posterior chest pain radiating to back for 1 day. EXAM: CHEST  2 VIEW COMPARISON:  10/08/2017 CT chest. FINDINGS: Normal cardiac silhouette. Calcified left hilar lymph nodes as seen on prior CT of chest. Stable left lower lobe calcified granuloma. No consolidation, effusion, or pneumothorax. Bones are unremarkable. IMPRESSION: No acute pulmonary process identified. Electronically Signed   By: Kristine Garbe M.D.   On: 10/15/2017 21:40   Dg Thoracic Spine 2 View  Result Date: 10/14/2017 CLINICAL DATA:  Fall last week with persistent pain, initial encounter EXAM: THORACIC SPINE 2 VIEWS COMPARISON:  10/08/2017 FINDINGS: Vertebral body height is well maintained. The pedicles are within normal limits. No paraspinal mass or rib abnormality is seen. Multiple calcified lymph nodes are noted consistent with prior granulomatous disease. IMPRESSION: No acute abnormality noted. Electronically Signed   By: Inez Catalina M.D.   On: 10/14/2017 10:59   Dg Lumbar Spine 2-3 Views  Result Date: 10/14/2017 CLINICAL DATA:  Pain following recent fall with left-sided radicular symptoms EXAM: LUMBAR SPINE - 2-3 VIEW COMPARISON:  None. FINDINGS: Frontal, lateral, and spot lumbosacral lateral images obtained. There are 5 non-rib-bearing lumbar type vertebral bodies. There is no fracture or spondylolisthesis. The disc spaces appear normal. There are small anterior osteophytes at L3 and L4. IMPRESSION: No fracture or spondylolisthesis. No appreciable disc space narrowing. Electronically Signed   By: Lowella Grip III M.D.   On: 10/14/2017 11:00     Procedures Procedures (including critical care time)  Medications Ordered in ED Medications - No data to display   Initial Impression / Assessment and Plan / ED Course  I have reviewed the triage vital signs and the nursing notes.  Pertinent labs & imaging results that were available during my care of the patient were reviewed by me and considered in my medical decision making (see chart for details).     6:49  AM She reportedly here in the emergency department for concern for withdrawal.  She just got out of hospital in February 5, therefore alcohol withdrawal is less likely, there is also reports history of pure X, she denies this but reports she has drank hand sanitizer before  she denies any SI at this time.  She has no signs of acute withdrawal at this time.  She is ambulatory.  Her labs are near baseline.  Her chest x-ray is negative.  Her EKG is unchanged.  There is no anion gap. 7:57 AM BP 107/66 (BP Location: Right Arm)   Pulse 90   Temp 99.3 F (37.4 C) (Oral)   Resp 16   LMP  (LMP Unknown)   SpO2 98%  Patient resting comfortably, no distress noted, will discharge Final Clinical Impressions(s) / ED Diagnoses   Final diagnoses:  Liver disease  Precordial pain    ED Discharge Orders    None       Ripley Fraise, MD 10/16/17 [phone]

## 2017-10-16 NOTE — ED Notes (Signed)
Pt stated that she is here because she wants surgery on her liver

## 2017-10-16 NOTE — ED Notes (Signed)
ED Provider at bedside. 

## 2017-10-21 ENCOUNTER — Encounter: Payer: Self-pay | Admitting: Family Medicine

## 2017-10-21 ENCOUNTER — Ambulatory Visit (INDEPENDENT_AMBULATORY_CARE_PROVIDER_SITE_OTHER): Payer: Medicaid Other | Admitting: Family Medicine

## 2017-10-21 VITALS — BP 122/78 | HR 90 | Temp 98.1°F | Resp 16 | Ht 60.0 in | Wt 129.0 lb

## 2017-10-21 DIAGNOSIS — N898 Other specified noninflammatory disorders of vagina: Secondary | ICD-10-CM

## 2017-10-21 DIAGNOSIS — R0789 Other chest pain: Secondary | ICD-10-CM

## 2017-10-21 DIAGNOSIS — F1910 Other psychoactive substance abuse, uncomplicated: Secondary | ICD-10-CM

## 2017-10-21 DIAGNOSIS — Z131 Encounter for screening for diabetes mellitus: Secondary | ICD-10-CM | POA: Diagnosis not present

## 2017-10-21 DIAGNOSIS — B182 Chronic viral hepatitis C: Secondary | ICD-10-CM

## 2017-10-21 DIAGNOSIS — Z1329 Encounter for screening for other suspected endocrine disorder: Secondary | ICD-10-CM

## 2017-10-21 LAB — POCT URINALYSIS DIP (DEVICE)
Glucose, UA: NEGATIVE mg/dL
Nitrite: NEGATIVE
Protein, ur: NEGATIVE mg/dL
Specific Gravity, Urine: 1.025 (ref 1.005–1.030)
Urobilinogen, UA: 2 mg/dL — ABNORMAL HIGH (ref 0.0–1.0)
pH: 7 (ref 5.0–8.0)

## 2017-10-21 LAB — POCT GLYCOSYLATED HEMOGLOBIN (HGB A1C): Hemoglobin A1C: 4.9

## 2017-10-21 MED ORDER — SPIRONOLACTONE 50 MG PO TABS: 50.0000 mg | ORAL_TABLET | Freq: Every day | ORAL | 2 refills | Status: DC

## 2017-10-21 MED ORDER — FUROSEMIDE 20 MG PO TABS
20.0000 mg | ORAL_TABLET | Freq: Every day | ORAL | 2 refills | Status: DC
Start: 2017-10-21 — End: 2017-10-21

## 2017-10-21 MED ORDER — TOPIRAMATE 50 MG PO TABS: 50.0000 mg | ORAL_TABLET | Freq: Every day | ORAL | 0 refills | Status: DC

## 2017-10-21 MED ORDER — HALOPERIDOL 2 MG PO TABS: 2.0000 mg | ORAL_TABLET | Freq: Every day | ORAL | 0 refills | Status: DC

## 2017-10-21 MED ORDER — FUROSEMIDE 20 MG PO TABS: 20.0000 mg | ORAL_TABLET | Freq: Every day | ORAL | 2 refills | Status: DC

## 2017-10-21 MED ORDER — TOPIRAMATE 50 MG PO TABS: 50.0000 mg | ORAL_TABLET | Freq: Every day | ORAL | 1 refills | Status: DC

## 2017-10-21 MED FILL — ?SPIRONOLACTONE 50 MG TABLE: 50 | 30 days supply | Qty: 30 | Fill #0

## 2017-10-21 MED FILL — HALOPERIDOL 2 MG TABLET: 2 | 30 days supply | Qty: 30 | Fill #0

## 2017-10-21 NOTE — Progress Notes (Signed)
Patient ID: Briana Sweeney, female    DOB: 05-11-1971, 46 y.o.   MRN: [id number]  PCP: Scot Jun, FNP  Chief Complaint  Patient presents with  . Establish Care  . Hospitalization Follow-up    Subjective:  HPI Briana Sweeney is a 47 y.o. female, with a medical history significant for hepatitis C, cirrhosis of the liver, GERD, current  polysubstance abuse, major depressive disorder, and schizophrenia.  She reports recently locating from [place] she has been hospitalized 5 times since arriving in [place] in November 2018.  She admittedly continues to abuse alcohol and drugs.  Not currently followed by a hepatitis specialist for her liver disease.  In December 2018 she experienced emesis and was found to have esophageal varices.  He has significant mental health disorder and at present is not being followed her medically manage for schizophrenia, major depressive disorder or bipolar. She takes BuSpar for anxiety which she reports improves her symptoms.  She reports chest wall tightness with inhalation and expiration.  She is a non-smoker of cigarettes.  Denies any associated shortness of breath or cough. At present she denies of weakness, dizziness, suicidal ideations, homicidal ideations or auditory hallucinations. He needs professional management of mental health disorders she reports "I can take care me myself".  Social History   Socioeconomic History  . Marital status: Legally Separated    Spouse name: Not on file  . Number of children: Not on file  . Years of education: Not on file  . Highest education level: Not on file  Social Needs  . Financial resource strain: Not on file  . Food insecurity - worry: Not on file  . Food insecurity - inability: Not on file  . Transportation needs - medical: Not on file  . Transportation needs - non-medical: Not on file  Occupational History  . Occupation: applying for disability  Tobacco Use  . Smoking status: Never Smoker  .  Smokeless tobacco: Never Used  Substance and Sexual Activity  . Alcohol use: Yes    Alcohol/week: 268.8 oz    Types: 448 Shots of liquor per week    Comment: 09/01/2017 "maybe 1/2 gallon vodka/day"  . Drug use: No  . Sexual activity: Not Currently  Other Topics Concern  . Not on file  Social History Narrative   She moved with a boyfriend to [place] and was [facility].  He died of a massive heart attack in 8/18, per her report, and so she moved back to [place] and is living with a friend.    Family History  Problem Relation Age of Onset  . Lung cancer Mother 55  . Alcohol abuse Mother   . Throat cancer Father 31     Review of Systems  Constitutional: Positive for fatigue.  Eyes: Negative.   Respiratory: Positive for chest tightness.   Cardiovascular: Negative.   Gastrointestinal: Positive for nausea. Negative for abdominal distention, abdominal pain, blood in stool, rectal pain and vomiting.  Genitourinary:       Positive for vaginal odor  Neurological: Negative.   Psychiatric/Behavioral: Positive for decreased concentration and sleep disturbance. Negative for self-injury and suicidal ideas. The patient is nervous/anxious and is hyperactive.     Patient Active Problem List   Diagnosis Date Noted  . Acute hyperactive alcohol withdrawal delirium ([place]) 10/09/2017  . Schizophrenia ([place]) 10/09/2017  . Alcohol abuse with alcohol-induced mood disorder ([place])   . Alcohol withdrawal ([place]) 09/11/2017  . Esophageal varices without bleeding ([place])   .  Hematemesis 09/01/2017  . Ascites due to alcoholic cirrhosis ([place])   . Decompensated hepatic cirrhosis ([place]) 07/23/2017  . Alcohol abuse 07/23/2017  . Hepatitis C, chronic ([place]) 07/23/2017  . Hypokalemia 07/23/2017  . Jaundice 07/23/2017  . Coagulopathy ([place]) 07/23/2017  . Hypomagnesemia 07/23/2017  . Pancytopenia ([place]) 07/23/2017  . Alcoholic cirrhosis of liver with ascites ([place]) 07/23/2017    No Known  Allergies  Prior to Admission medications   Medication Sig Start Date End Date Taking? Authorizing Provider  busPIRone (BUSPAR) 7.5 MG tablet Take 1 tablet (7.5 mg total) by mouth 2 (two) times daily. 10/14/17  Yes Rai, Ripudeep K, MD  chlordiazePOXIDE (LIBRIUM) 10 MG capsule Take librium 10mg  twice a day for 2 days, then daily for 3 days then OFF 10/14/17  Yes Rai, Ripudeep K, MD  folic acid (FOLVITE) 1 MG tablet Take 1 tablet (1 mg total) by mouth daily. 10/15/17  Yes Rai, Ripudeep K, MD  furosemide (LASIX) 20 MG tablet Take 1 tablet (20 mg total) by mouth daily. 09/16/17  Yes Rai, Ripudeep K, MD  haloperidol (HALDOL) 2 MG tablet Take 1 tablet (2 mg total) by mouth at bedtime. 10/14/17  Yes Rai, Ripudeep K, MD  HYDROcodone-acetaminophen (NORCO/VICODIN) 5-325 MG tablet Take 1 tablet by mouth every 6 (six) hours as needed for moderate pain. 10/14/17  Yes Rai, Ripudeep K, MD  pantoprazole (PROTONIX) 40 MG tablet Take 1 tablet (40 mg total) by mouth daily. 10/14/17  Yes Rai, Ripudeep K, MD  pentoxifylline (TRENTAL) 400 MG CR tablet Take 1 tablet (400 mg total) by mouth 3 (three) times daily with meals. 10/14/17  Yes Rai, Ripudeep K, MD  phenytoin (DILANTIN) 100 MG ER capsule Take 3 capsules (300 mg total) by mouth at bedtime. 10/14/17  Yes Rai, Ripudeep K, MD  spironolactone (ALDACTONE) 50 MG tablet Take 1 tablet (50 mg total) by mouth daily. 10/14/17  Yes Rai, Ripudeep K, MD  thiamine 100 MG tablet Take 1 tablet (100 mg total) by mouth daily. 10/14/17  Yes Rai, Ripudeep K, MD  topiramate (TOPAMAX) 50 MG tablet Take 1 tablet (50 mg total) by mouth daily. 10/14/17  Yes Rai, Vernelle Emerald, MD    Past Medical, Surgical Family and Social History reviewed and updated.    Objective:   Today's Vitals   10/21/17 1302  BP: 122/78  Pulse: 90  Resp: 16  Temp: 98.1 F (36.7 C)  TempSrc: Oral  SpO2: 99%  Weight: 129 lb (58.5 kg)  Height: 5' (1.524 m)    Wt Readings from Last 3 Encounters:  10/21/17 129 lb (58.5 kg)   10/14/17 125 lb 14.1 oz (57.1 kg)  09/23/17 133 lb (60.3 kg)    Physical Exam  Constitutional: She is oriented to person, place, and time. She has a sickly appearance.  Eyes: Conjunctivae and EOM are normal. Pupils are equal, round, and reactive to light. Scleral icterus is present.  Neck: Normal range of motion. Neck supple. No thyromegaly present.  Cardiovascular: Normal rate, regular rhythm, normal heart sounds and intact distal pulses.  Pulmonary/Chest: Effort normal and breath sounds normal.  Abdominal: Soft. Bowel sounds are normal. She exhibits no distension. There is no tenderness.  Musculoskeletal: Normal range of motion.  Lymphadenopathy:    She has no cervical adenopathy.  Neurological: She is alert and oriented to person, place, and time.  Skin: Skin is warm and dry.  Psychiatric: Her affect is blunt.  Quiet slow speech  She is inattentive.   Assessment & Plan:  1. Polysubstance abuse ([place]), currently active. Referred patient back to [name], however she declined to pursue their services.   2. Screening for diabetes mellitus, A1C 4.9, normal repeat in 12 months.  3. Screening for thyroid disorder,  TSH  4. Vaginal odor, history of STI. Obtained a vaginal specimen will screen for STI-GC/chylamida , Trichomonas, Bacterial Vaginosis, and Candidiasis- NuSwab Vaginitis Plus (VG+)  5. Chronic hepatitis C without hepatic coma ([place]), will complete repeat testing confirm Hep C with reflex. Patient is not completely sure if she received treatment in [place] for infection. Will resume patient on medications. for management of liver disease. Provided a [facility] financial assistance form to complete in order to be referred to gastroenterology   6. Chest wall pain, unspecified. No palpable pain-exacerbated by coughing or deep breathing.  - DG Chest 2 View; Future  Meds ordered this encounter  Medications  . DISCONTD: topiramate (TOPAMAX) 50 MG tablet    Sig:  Take 1 tablet (50 mg total) by mouth daily.    Dispense:  30 tablet    Refill:  0    Order Specific Question:   Supervising Provider    Answer:   Tresa Garter [id number]  . DISCONTD: haloperidol (HALDOL) 2 MG tablet    Sig: Take 1 tablet (2 mg total) by mouth at bedtime.    Dispense:  30 tablet    Refill:  0    Order Specific Question:   Supervising Provider    Answer:   Tresa Garter [id number]  . DISCONTD: furosemide (LASIX) 20 MG tablet    Sig: Take 1 tablet (20 mg total) by mouth daily.    Dispense:  30 tablet    Refill:  2    Order Specific Question:   Supervising Provider    Answer:   Tresa Garter [id number]  . DISCONTD: spironolactone (ALDACTONE) 50 MG tablet    Sig: Take 1 tablet (50 mg total) by mouth daily.    Dispense:  30 tablet    Refill:  2    Order Specific Question:   Supervising Provider    Answer:   Tresa Garter [id number]  . topiramate (TOPAMAX) 50 MG tablet    Sig: Take 1 tablet (50 mg total) by mouth daily.    Dispense:  90 tablet    Refill:  1    Order Specific Question:   Supervising Provider    Answer:   Tresa Garter [id number]  . haloperidol (HALDOL) 2 MG tablet    Sig: Take 1 tablet (2 mg total) by mouth at bedtime.    Dispense:  90 tablet    Refill:  0    Order Specific Question:   Supervising Provider    Answer:   Tresa Garter [id number]  . furosemide (LASIX) 20 MG tablet    Sig: Take 1 tablet (20 mg total) by mouth daily.    Dispense:  90 tablet    Refill:  2    Order Specific Question:   Supervising Provider    Answer:   Tresa Garter [id number]  . spironolactone (ALDACTONE) 50 MG tablet    Sig: Take 1 tablet (50 mg total) by mouth daily.    Dispense:  90 tablet    Refill:  2    Order Specific Question:   Supervising Provider    Answer:   Tresa Garter [id number]    Orders Placed This Encounter  Procedures  . DG Chest 2 View  .  CBC with Differential  . Comprehensive metabolic panel   . TSH  . NuSwab Vaginitis Plus (VG+)  . Hepatitis c vrs RNA detect by PCR-qual  . POCT glycosylated hemoglobin (Hb A1C)  . POCT urinalysis dip (device)    RTC: 4 weeks for chronic condition follow-up and PAP    Carroll Sage. Kenton Kingfisher, MSN, FNP-C [facility]  [address] [place] [place], [place] [address] [phone]

## 2017-10-21 NOTE — Patient Instructions (Addendum)
Go downstairs to obtain a chest x-ray to evaluate the cause of your chest wall pain.  I have refilled medications that you requested.

## 2017-10-22 ENCOUNTER — Emergency Department (HOSPITAL_COMMUNITY): Payer: Self-pay

## 2017-10-22 ENCOUNTER — Other Ambulatory Visit: Payer: Self-pay

## 2017-10-22 ENCOUNTER — Emergency Department (HOSPITAL_COMMUNITY)
Admission: EM | Admit: 2017-10-22 | Discharge: 2017-10-23 | Disposition: A | Discharge status: Home / Self Care | Payer: Self-pay | Attending: Emergency Medicine | Admitting: Emergency Medicine

## 2017-10-22 ENCOUNTER — Encounter (HOSPITAL_COMMUNITY): Payer: Self-pay | Admitting: Emergency Medicine

## 2017-10-22 DIAGNOSIS — R109 Unspecified abdominal pain: Secondary | ICD-10-CM

## 2017-10-22 DIAGNOSIS — R1084 Generalized abdominal pain: Secondary | ICD-10-CM | POA: Insufficient documentation

## 2017-10-22 DIAGNOSIS — R0789 Other chest pain: Secondary | ICD-10-CM | POA: Insufficient documentation

## 2017-10-22 DIAGNOSIS — Z79899 Other long term (current) drug therapy: Secondary | ICD-10-CM | POA: Insufficient documentation

## 2017-10-22 LAB — ETHANOL: Alcohol, Ethyl (B): 253 mg/dL — ABNORMAL HIGH (ref <10)

## 2017-10-22 LAB — URINALYSIS, ROUTINE W REFLEX MICROSCOPIC
Bilirubin Urine: NEGATIVE
Glucose, UA: NEGATIVE mg/dL
Hgb urine dipstick: NEGATIVE
Ketones, ur: NEGATIVE mg/dL
Nitrite: NEGATIVE
Protein, ur: NEGATIVE mg/dL
Specific Gravity, Urine: 1.015 (ref 1.005–1.030)
pH: 6 (ref 5.0–8.0)

## 2017-10-22 LAB — AMMONIA: Ammonia: 39 umol/L — ABNORMAL HIGH (ref 9–35)

## 2017-10-22 NOTE — ED Triage Notes (Signed)
Per [facility]: pt from a friend's house with c/o N/V and right sided abdominal pain.  Pt stated she drank 6-8 large beers today and has bright red blood/pain with bowel movements.

## 2017-10-23 ENCOUNTER — Emergency Department (HOSPITAL_COMMUNITY)
Admission: EM | Admit: 2017-10-23 | Discharge: 2017-10-23 | Disposition: A | Discharge status: Home / Self Care | Payer: Self-pay | Attending: Emergency Medicine | Admitting: Emergency Medicine

## 2017-10-23 ENCOUNTER — Other Ambulatory Visit: Payer: Self-pay

## 2017-10-23 DIAGNOSIS — Z5321 Procedure and treatment not carried out due to patient leaving prior to being seen by health care provider: Secondary | ICD-10-CM | POA: Insufficient documentation

## 2017-10-23 DIAGNOSIS — R040 Epistaxis: Secondary | ICD-10-CM | POA: Insufficient documentation

## 2017-10-23 LAB — CBC WITH DIFFERENTIAL/PLATELET
Basophils Absolute: 0 10*3/uL (ref 0.0–0.2)
Basos: 1 %
EOS (ABSOLUTE): 0.1 10*3/uL (ref 0.0–0.4)
Eos: 3 %
Hematocrit: 33.8 % — ABNORMAL LOW (ref 34.0–46.6)
Hemoglobin: 11 g/dL — ABNORMAL LOW (ref 11.1–15.9)
Immature Grans (Abs): 0 10*3/uL (ref 0.0–0.1)
Immature Granulocytes: 0 %
Lymphocytes Absolute: 1.2 10*3/uL (ref 0.7–3.1)
Lymphs: 24 %
MCH: 29.2 pg (ref 26.6–33.0)
MCHC: 32.5 g/dL (ref 31.5–35.7)
MCV: 90 fL (ref 79–97)
Monocytes Absolute: 0.5 10*3/uL (ref 0.1–0.9)
Monocytes: 10 %
Neutrophils Absolute: 3.2 10*3/uL (ref 1.4–7.0)
Neutrophils: 62 %
RBC: 3.77 x10E6/uL (ref 3.77–5.28)
RDW: 17.7 % — ABNORMAL HIGH (ref 12.3–15.4)
WBC: 5.1 10*3/uL (ref 3.4–10.8)

## 2017-10-23 LAB — COMPREHENSIVE METABOLIC PANEL
ALT: 17 IU/L (ref 0–32)
AST: 40 IU/L (ref 0–40)
Albumin/Globulin Ratio: 0.8 — ABNORMAL LOW (ref 1.2–2.2)
Albumin: 3.7 g/dL (ref 3.5–5.5)
Alkaline Phosphatase: 196 IU/L — ABNORMAL HIGH (ref 39–117)
BUN/Creatinine Ratio: 14 (ref 9–23)
BUN: 7 mg/dL (ref 6–24)
Bilirubin Total: 0.6 mg/dL (ref 0.0–1.2)
CO2: 19 mmol/L — ABNORMAL LOW (ref 20–29)
Calcium: 8.5 mg/dL — ABNORMAL LOW (ref 8.7–10.2)
Chloride: 104 mmol/L (ref 96–106)
Creatinine, Ser: 0.5 mg/dL — ABNORMAL LOW (ref 0.57–1.00)
GFR calc Af Amer: 135 mL/min/{1.73_m2} (ref >59)
GFR calc non Af Amer: 117 mL/min/{1.73_m2} (ref >59)
Globulin, Total: 4.8 g/dL — ABNORMAL HIGH (ref 1.5–4.5)
Glucose: 92 mg/dL (ref 65–99)
Potassium: 3.6 mmol/L (ref 3.5–5.2)
Sodium: 141 mmol/L (ref 134–144)
Total Protein: 8.5 g/dL (ref 6.0–8.5)

## 2017-10-23 LAB — HEPATITIS C VRS RNA DETECT BY PCR-QUAL: HCV RNA NAA Qualitative: NEGATIVE

## 2017-10-23 LAB — TSH: TSH: 1.22 u[IU]/mL (ref 0.450–4.500)

## 2017-10-23 MED ORDER — KETOROLAC TROMETHAMINE 30 MG/ML IJ SOLN
30.0000 mg | Freq: Once | INTRAMUSCULAR | Status: AC
  Administered 2017-10-23: 30 mg via INTRAMUSCULAR
  Filled 2017-10-23: qty 1

## 2017-10-23 NOTE — ED Triage Notes (Signed)
Pt states she was just seen on the ED got a Toradol and she started bleeding to her nose.

## 2017-10-23 NOTE — ED Provider Notes (Signed)
= [facility] Provider Note   CSN: [id number] Arrival date & time: 10/22/17  2037     History   Chief Complaint No chief complaint on file.   HPI Briana Sweeney is a 47 y.o. female.  HPI Patient presents to the emergency department with multiple complaints.  The patient states that her main complaint seems to be lateral right chest discomfort.  The patient states she has had this intermittently for quite a while.  She states that it seemed to be a sharp sensations at night and that is what brought her to the emergency department patient states that she did not take any medications prior to arrival.  She states she has been drinking this evening.  Patient has multiple complaints of abdominal discomfort the chest discomfort. the patient denies  shortness of breath, headache,blurred vision, neck pain, fever, cough, weakness, numbness, dizziness, anorexia, edema,  nausea, vomiting, diarrhea, rash, back pain, dysuria, hematemesis, bloody stool, near syncope, or syncope. Past Medical History:  Diagnosis Date  . Anxiety   . Bipolar affective disorder ([place])    With anxiety features  . 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 Depression   . GERD (gastroesophageal reflux disease)   . Hepatitis C 2018   hepatitis c and alcohol related hepatitis  . History of blood transfusion    "blood doesn't clot; I fell down and had to have a transfusion"  . History of kidney stones   . Migraine    "when I get really stressed" (09/01/2017)  . Schizophrenia ([place])   . Seizures ([place])    "when I run out of my RX; lots recently" (09/01/2017)    Patient Active Problem List   Diagnosis Date Noted  . Acute hyperactive alcohol withdrawal delirium ([place]) 10/09/2017  . Schizophrenia ([place]) 10/09/2017  . Alcohol abuse with alcohol-induced mood disorder ([place])   . Alcohol withdrawal ([place]) 09/11/2017  . 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  Hematemesis 09/01/2017  . Ascites due to alcoholic cirrhosis ([place])   . Decompensated hepatic cirrhosis ([place]) 07/23/2017  . Alcohol abuse 07/23/2017  . Hepatitis C, chronic ([place]) 07/23/2017  . Hypokalemia 07/23/2017  . Jaundice 07/23/2017  . Coagulopathy ([place]) 07/23/2017  . Hypomagnesemia 07/23/2017  . Pancytopenia ([place]) 07/23/2017  . Alcoholic cirrhosis of liver with ascites ([place]) 07/23/2017    Past Surgical History:  Procedure Laterality Date  . ESOPHAGOGASTRODUODENOSCOPY N/A 09/03/2017   Procedure: ESOPHAGOGASTRODUODENOSCOPY (EGD);  Surgeon: Doran Stabler, MD;  Location: [facility];  Service: Gastroenterology;  Laterality: N/A;  . FINGER FRACTURE SURGERY Left    "shattered my pinky"  . FRACTURE SURGERY    . IR PARACENTESIS  07/23/2017  . IR PARACENTESIS  07/2017   "did it twice in the same week" (09/01/2017)  . SHOULDER OPEN ROTATOR CUFF REPAIR Right   . TUBAL LIGATION    . VAGINAL HYSTERECTOMY      OB History    No data available       Home Medications    Prior to Admission medications   Medication Sig Start Date End Date Taking? Authorizing Provider  busPIRone (BUSPAR) 7.5 MG tablet Take 1 tablet (7.5 mg total) by mouth 2 (two) times daily. 10/14/17   Rai, Vernelle Emerald, MD  chlordiazePOXIDE (LIBRIUM) 10 MG capsule Take librium 10mg  twice a day for 2 days, then daily for 3 days then OFF 10/14/17   Rai, Vernelle Emerald, MD  folic  acid (FOLVITE) 1 MG tablet Take 1 tablet (1 mg total) by mouth daily. 10/15/17   Rai, Vernelle Emerald, MD  furosemide (LASIX) 20 MG tablet Take 1 tablet (20 mg total) by mouth daily. 10/21/17   Scot Jun, FNP  haloperidol (HALDOL) 2 MG tablet Take 1 tablet (2 mg total) by mouth at bedtime. 10/21/17   Scot Jun, FNP  HYDROcodone-acetaminophen (NORCO/VICODIN) 5-325 MG tablet Take 1 tablet by mouth every 6 (six) hours as needed for moderate pain. 10/14/17   Rai, Ripudeep K, MD  pantoprazole (PROTONIX) 40 MG tablet Take 1 tablet (40 mg total) by  mouth daily. 10/14/17   Rai, Vernelle Emerald, MD  pentoxifylline (TRENTAL) 400 MG CR tablet Take 1 tablet (400 mg total) by mouth 3 (three) times daily with meals. 10/14/17   Rai, Vernelle Emerald, MD  phenytoin (DILANTIN) 100 MG ER capsule Take 3 capsules (300 mg total) by mouth at bedtime. 10/14/17   Rai, Vernelle Emerald, MD  spironolactone (ALDACTONE) 50 MG tablet Take 1 tablet (50 mg total) by mouth daily. 10/21/17   Scot Jun, FNP  thiamine 100 MG tablet Take 1 tablet (100 mg total) by mouth daily. 10/14/17   Rai, Vernelle Emerald, MD  topiramate (TOPAMAX) 50 MG tablet Take 1 tablet (50 mg total) by mouth daily. 10/21/17   Scot Jun, FNP    Family History Family History  Problem Relation Age of Onset  . Lung cancer Mother 41  . Alcohol abuse Mother   . Throat cancer Father 2    Social History Social History   Tobacco Use  . Smoking status: Never Smoker  . Smokeless tobacco: Never Used  Substance Use Topics  . Alcohol use: Yes    Alcohol/week: 268.8 oz    Types: 448 Shots of liquor per week    Comment: 09/01/2017 "maybe 1/2 gallon vodka/day"  . Drug use: No     Allergies   Patient has no known allergies.   Review of Systems Review of Systems All other systems negative except as documented in the HPI. All pertinent positives and negatives as reviewed in the HPI.  Physical Exam Updated Vital Signs BP 122/86 (BP Location: Right Arm)   Pulse (!) 104   Temp 98.6 F (37 C) (Oral)   Resp 18   Ht 5' (1.524 m)   Wt 58.5 kg (129 lb)   LMP  (LMP Unknown)   SpO2 100%   BMI 25.19 kg/m   Physical Exam  Constitutional: She is oriented to person, place, and time. She appears well-developed and well-nourished. No distress.  HENT:  Head: Normocephalic and atraumatic.  Mouth/Throat: Oropharynx is clear and moist.  Eyes: Pupils are equal, round, and reactive to light.  Neck: Normal range of motion. Neck supple.  Cardiovascular: Normal rate, regular rhythm and normal heart sounds. Exam  reveals no gallop and no friction rub.  No murmur heard. Pulmonary/Chest: Effort normal and breath sounds normal. No respiratory distress. She has no wheezes. She exhibits tenderness.  Abdominal: Soft. Bowel sounds are normal. She exhibits no distension. There is no tenderness. There is no guarding.  Neurological: She is alert and oriented to person, place, and time. She exhibits normal muscle tone. Coordination normal.  Skin: Skin is warm and dry. Capillary refill takes less than 2 seconds. No rash noted. No erythema.  Psychiatric: She has a normal mood and affect. Her behavior is normal.  Nursing note and vitals reviewed.    ED Treatments / Results  Labs (all labs ordered are listed, but only abnormal results are displayed) Labs Reviewed  ETHANOL - Abnormal; Notable for the following components:      Result Value   Alcohol, Ethyl (B) 253 (*)    All other components within normal limits  URINALYSIS, ROUTINE W REFLEX MICROSCOPIC - Abnormal; Notable for the following components:   APPearance HAZY (*)    Leukocytes, UA LARGE (*)    Bacteria, UA RARE (*)    Squamous Epithelial / LPF 0-5 (*)    All other components within normal limits  AMMONIA - Abnormal; Notable for the following components:   Ammonia 39 (*)    All other components within normal limits    EKG  EKG Interpretation None       Radiology Dg Abdomen Acute W/chest  Result Date: 10/22/2017 CLINICAL DATA:  47 year old female with right abdominal pain and nausea vomiting for several days. Hepatitis-C, cirrhosis. EXAM: DG ABDOMEN ACUTE W/ 1V CHEST COMPARISON:  Chest radiographs 10/15/2017. Abdominal radiographs 07/27/2017 and earlier. FINDINGS: Mediastinal contours remain normal; there are small chronic calcified left mediastinal lymph nodes. Visualized tracheal air column is within normal limits. Improved lung volumes compared to the recent chest radiographs. No pneumothorax or pneumoperitoneum. No pleural effusion or  confluent pulmonary opacity. Non obstructed bowel gas pattern. Decreased gray hazy opacity in the abdomen compared to November. Chronic postoperative changes to the right shoulder. No acute osseous abnormality identified. IMPRESSION: 1.  Normal bowel gas pattern, no free air. 2.  No acute cardiopulmonary abnormality. Electronically Signed   By: Genevie Ann M.D.   On: 10/22/2017 23:00    Procedures Procedures (including critical care time)  Medications Ordered in ED Medications  ketorolac (TORADOL) 30 MG/ML injection 30 mg (not administered)     Initial Impression / Assessment and Plan / ED Course  I have reviewed the triage vital signs and the nursing notes.  Pertinent labs & imaging results that were available during my care of the patient were reviewed by me and considered in my medical decision making (see chart for details).     Patient has multiple complaints but at this time I do not feel there is any significant issues.  I feel the patient is to follow-up with her primary doctor along with her doctors who follows along with her ascites.  Patient agrees the plan and all questions were answered.  The patient is feeling some better at this time.  Final Clinical Impressions(s) / ED Diagnoses   Final diagnoses:  None    ED Discharge Orders    None       Dalia Heading, PA-C 10/23/17 0055    Hayden Rasmussen, MD 10/23/17 1139

## 2017-10-23 NOTE — Discharge Instructions (Signed)
Return here as needed. Follow up with your doctor. °

## 2017-10-23 NOTE — ED Notes (Signed)
Pt departed in NAD, refused use of wheelchair. Given bus pass upon request.

## 2017-10-24 LAB — NUSWAB VAGINITIS PLUS (VG+)
Candida albicans, NAA: NEGATIVE
Candida glabrata, NAA: NEGATIVE
Chlamydia trachomatis, NAA: NEGATIVE
Neisseria gonorrhoeae, NAA: NEGATIVE
Trich vag by NAA: POSITIVE — AB

## 2017-10-24 MED FILL — PENTOXIFYLLINE 400 MG TAB S: 400 | 30 days supply | Qty: 90 | Fill #0

## 2017-10-24 MED FILL — ?PHENYTOIN SOD EXT 100 MG C: 100 | 30 days supply | Qty: 90 | Fill #0

## 2017-10-24 MED FILL — busPIRone HCL 7.5 MG TABS: 7.5 | 30 days supply | Qty: 60 | Fill #0

## 2017-10-24 MED FILL — ?FUROSEMIDE 20 MG TABLET: 20 | 30 days supply | Qty: 30 | Fill #0

## 2017-10-24 MED FILL — TOPIRAMATE 50 MG TABLET: 50 | 30 days supply | Qty: 30 | Fill #0

## 2017-10-26 ENCOUNTER — Telehealth: Payer: Self-pay | Admitting: Family Medicine

## 2017-10-26 MED ORDER — METRONIDAZOLE 500 MG PO TABS: 500.0000 mg | ORAL_TABLET | Freq: Two times a day (BID) | ORAL | 0 refills | Status: DC

## 2017-10-26 NOTE — BH Assessment (Signed)
Assessment Note  Briana Sweeney is an 47 y.o. female. Pt reports she has a diagnosis of bipolar disorder "and I'm an alcoholic."  She says she has felt depressed and hopeless since witnessint her boyfirend die of a heart attack on 05/04/17.  PT states, "I haven't been right since."  She reports suicidal ideation "every day" and states she wishes she could die.  She says she has thoughts of overdosing on medication and states she has attempted suicide by overdose in the past.  She reports symptoms including crying spells, social withdrawal, insomnia, poor appetite and feelings of hopelessness.  She says she is drinking "a fifth to a half gallon of liquor plus some beer" daily and has been doing so for months.  She states she last drank yesterday and is reporting withdrawal symptoms including nausea, vomiting, sweats, chills and seizures.  Pt states she has a history of seizures and her last one was less than 2 weeks ago. Pt says she has used drugs in the past, but recenlyt has only been using alcohol.  She reports hallucinations of "seeing trees that are talking to me."  Pt reports she lives with a roommate but he is often out of town.  She says she has very few people in her life, who are supportive.  She has a 31 year old daughter, who lives in [place].  She reports chronic medical problems including cirrhosis and her medica record indicates numerous presentations to area emergency rooms for abdominal pain.  Pt denies legal problems.  Pt states she was feeling very ill during assessment and was unable to answer further questions.  She says she is willing to sign voluntarily into a psychiatric facility.  Diagnosis: F31.4 Bipolar I disorder, Current or most recent episode depressed, Severe, by history F10.20 Alcohol use disorder, Sever  Past Medical History:  Past Medical History:  Diagnosis Date  . Anxiety   . Bipolar affective disorder ([place])    With anxiety features  . 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 Depression   . GERD (gastroesophageal reflux disease)   . Hepatitis C 2018   hepatitis c and alcohol related hepatitis  . History of blood transfusion    "blood doesn't clot; I fell down and had to have a transfusion"  . History of kidney stones   . Migraine    "when I get really stressed" (09/01/2017)  . Schizophrenia ([place])   . Seizures ([place])    "when I run out of my RX; lots recently" (09/01/2017)    Past Surgical History:  Procedure Laterality Date  . ESOPHAGOGASTRODUODENOSCOPY N/A 09/03/2017   Procedure: ESOPHAGOGASTRODUODENOSCOPY (EGD);  Surgeon: Doran Stabler, MD;  Location: [facility];  Service: Gastroenterology;  Laterality: N/A;  . FINGER FRACTURE SURGERY Left    "shattered my pinky"  . FRACTURE SURGERY    . IR PARACENTESIS  07/23/2017  . IR PARACENTESIS  07/2017   "did it twice in the same week" (09/01/2017)  . SHOULDER OPEN ROTATOR CUFF REPAIR Right   . TUBAL LIGATION    . VAGINAL HYSTERECTOMY      Family History:  Family History  Problem Relation Age of Onset  . Lung cancer Mother 68  . Alcohol abuse Mother   . Throat cancer Father 28    Social History:  reports that  has never smoked. she has never used smokeless tobacco. She reports that she drinks about 268.8 oz of alcohol per week. She  reports that she does not use drugs.  Additional Social History:  Alcohol / Drug Use Pain Medications: See MAR Prescriptions: See MAR Over the Counter: See MAR History of alcohol / drug use?: Yes Longest period of sobriety (when/how long): unknown Substance #1 Name of Substance 1: alcohol 1 - Amount (size/oz): fifth of liquor 1 - Frequency: daily 1 - Duration: "months" 1 - Last Use / Amount: 10/25/17  CIWA:   COWS:    Allergies: No Known Allergies  Home Medications:  No medications prior to admission.    OB/GYN Status:  No LMP recorded (lmp unknown). Patient is postmenopausal.  General Assessment Data Location of  Assessment: [facility]) TTS Assessment: Out of system Is this a Tele or Face-to-Face Assessment?: Tele Assessment Is this an Initial Assessment or a Re-assessment for this encounter?: Initial Assessment Marital status: Single Maiden name: unable to assess Is patient pregnant?: No Pregnancy Status: No Living Arrangements: Non-relatives/Friends(roommates) Can pt return to current living arrangement?: Yes Admission Status: Voluntary Is patient capable of signing voluntary admission?: Yes Referral Source: Self/Family/Friend Insurance type: Self Pay     Crisis Care Plan Living Arrangements: Non-relatives/Friends(roommates) Legal Guardian: Other:(Self) Name of Psychiatrist: none Name of Therapist: none  Education Status Is patient currently in school?: No Current Grade: NA Highest grade of school patient has completed: unable to assess Name of school: NA Contact person: NA  Risk to self with the past 6 months Suicidal Ideation: Yes-Currently Present Has patient been a risk to self within the past 6 months prior to admission? : Yes Suicidal Intent: No Has patient had any suicidal intent within the past 6 months prior to admission? : No Is patient at risk for suicide?: Yes Suicidal Plan?: No Has patient had any suicidal plan within the past 6 months prior to admission? : No Access to Means: No What has been your use of drugs/alcohol within the last 12 months?: daily alcohol use Previous Attempts/Gestures: No How many times?: 0 Other Self Harm Risks: alcohol use Triggers for Past Attempts: None known Intentional Self Injurious Behavior: None Family Suicide History: Unknown Recent stressful life event(s): Loss (Comment)(Pt's bf dies in 05/29/2017) Persecutory voices/beliefs?: No Depression: Yes Depression Symptoms: Tearfulness, Isolating, Insomnia Substance abuse history and/or treatment for substance abuse?: Yes Suicide prevention information  given to non-admitted patients: Not applicable  Risk to Others within the past 6 months Homicidal Ideation: No Does patient have any lifetime risk of violence toward others beyond the six months prior to admission? : No Thoughts of Harm to Others: No Current Homicidal Intent: No Current Homicidal Plan: No Access to Homicidal Means: No Identified Victim: NA History of harm to others?: No Assessment of Violence: None Noted Violent Behavior Description: none Does patient have access to weapons?: No Criminal Charges Pending?: No Does patient have a court date: No Is patient on probation?: No  Psychosis Hallucinations: Auditory(hearing trees talk to her) Delusions: None noted  Mental Status Report Appearance/Hygiene: Unremarkable Eye Contact: Good Motor Activity: Unremarkable Speech: Unremarkable Level of Consciousness: Alert Mood: Depressed Affect: Anxious Anxiety Level: Moderate Thought Processes: Coherent, Relevant Judgement: Impaired Orientation: Person, Place, Time, Situation Obsessive Compulsive Thoughts/Behaviors: None  Cognitive Functioning Concentration: Normal Memory: Recent Intact, Remote Intact IQ: Average Insight: Fair Impulse Control: Fair Appetite: Poor Weight Loss: 0 Weight Gain: 0 Sleep: Decreased Total Hours of Sleep: 5 Vegetative Symptoms: Staying in bed  ADLScreening [facility] Assessment Services) Patient's cognitive ability adequate to safely complete daily activities?: Yes Patient able to express need  for assistance with ADLs?: Yes Independently performs ADLs?: Yes (appropriate for developmental age)  Prior Inpatient Therapy Prior Inpatient Therapy: No Prior Therapy Dates: NA Prior Therapy Facilty/Provider(s): NA Reason for Treatment: NA  Prior Outpatient Therapy Prior Outpatient Therapy: No Prior Therapy Dates: NA Prior Therapy Facilty/Provider(s): NA Reason for Treatment: NA Does patient have an ACCT team?: No Does patient have Intensive  In-House Services?  : No Does patient have [facility] services? : No Does patient have P4CC services?: No  ADL Screening (condition at time of admission) Patient's cognitive ability adequate to safely complete daily activities?: Yes Patient able to express need for assistance with ADLs?: Yes Independently performs ADLs?: Yes (appropriate for developmental age)       Abuse/Neglect Assessment (Assessment to be complete while patient is alone) Abuse/Neglect Assessment Can Be Completed: Yes Physical Abuse: Denies Verbal Abuse: Denies Sexual Abuse: Denies Exploitation of patient/patient's resources: Denies Self-Neglect: Denies Values / Beliefs Cultural Requests During Hospitalization: None Spiritual Requests During Hospitalization: None Consults Spiritual Care Consult Needed: No Social Work Consult Needed: No      Additional Information 1:1 In Past 12 Months?: No CIRT Risk: No Elopement Risk: No Does patient have medical clearance?: Yes     Disposition:  Disposition Initial Assessment Completed for this Encounter: Yes Disposition of Patient: Inpatient treatment program Type of inpatient treatment program: Adult  On Site Evaluation by:   Reviewed with Physician:    Enzo Montgomery 10/26/2017 1:50 PM

## 2017-10-26 NOTE — Telephone Encounter (Signed)
Vaginal specimen positive for trichomonas, prescribe metronidazole 500 mg twice daily x 7 days.   Carroll Sage. Kenton Kingfisher, MSN, FNP-C [facility]  [address] [place], [place] [address] [phone]

## 2017-10-28 ENCOUNTER — Other Ambulatory Visit: Payer: Self-pay

## 2017-10-28 ENCOUNTER — Inpatient Hospital Stay (HOSPITAL_COMMUNITY)
Admission: AD | Admit: 2017-10-28 | Discharge: 2017-11-02 | DRG: 885 | Disposition: A | Discharge status: Home / Self Care | Payer: Medicaid Other | Source: Intra-hospital | Attending: Psychiatry | Admitting: Psychiatry

## 2017-10-28 ENCOUNTER — Encounter (HOSPITAL_COMMUNITY): Payer: Self-pay

## 2017-10-28 DIAGNOSIS — F322 Major depressive disorder, single episode, severe without psychotic features: Secondary | ICD-10-CM | POA: Diagnosis not present

## 2017-10-28 DIAGNOSIS — Z915 Personal history of self-harm: Secondary | ICD-10-CM

## 2017-10-28 DIAGNOSIS — Z811 Family history of alcohol abuse and dependence: Secondary | ICD-10-CM | POA: Diagnosis not present

## 2017-10-28 DIAGNOSIS — Z79899 Other long term (current) drug therapy: Secondary | ICD-10-CM

## 2017-10-28 DIAGNOSIS — Z90710 Acquired absence of both cervix and uterus: Secondary | ICD-10-CM | POA: Diagnosis not present

## 2017-10-28 DIAGNOSIS — B182 Chronic viral hepatitis C: Secondary | ICD-10-CM | POA: Diagnosis present

## 2017-10-28 DIAGNOSIS — F209 Schizophrenia, unspecified: Secondary | ICD-10-CM | POA: Diagnosis present

## 2017-10-28 DIAGNOSIS — Z560 Unemployment, unspecified: Secondary | ICD-10-CM | POA: Diagnosis not present

## 2017-10-28 DIAGNOSIS — K219 Gastro-esophageal reflux disease without esophagitis: Secondary | ICD-10-CM | POA: Diagnosis present

## 2017-10-28 DIAGNOSIS — F1020 Alcohol dependence, uncomplicated: Secondary | ICD-10-CM | POA: Diagnosis not present

## 2017-10-28 DIAGNOSIS — R450 Nervousness: Secondary | ICD-10-CM | POA: Diagnosis not present

## 2017-10-28 DIAGNOSIS — F1024 Alcohol dependence with alcohol-induced mood disorder: Secondary | ICD-10-CM | POA: Diagnosis not present

## 2017-10-28 DIAGNOSIS — A599 Trichomoniasis, unspecified: Secondary | ICD-10-CM | POA: Diagnosis present

## 2017-10-28 DIAGNOSIS — Z818 Family history of other mental and behavioral disorders: Secondary | ICD-10-CM | POA: Diagnosis not present

## 2017-10-28 DIAGNOSIS — Z634 Disappearance and death of family member: Secondary | ICD-10-CM | POA: Diagnosis not present

## 2017-10-28 DIAGNOSIS — R45851 Suicidal ideations: Secondary | ICD-10-CM | POA: Diagnosis present

## 2017-10-28 DIAGNOSIS — F419 Anxiety disorder, unspecified: Secondary | ICD-10-CM | POA: Diagnosis not present

## 2017-10-28 DIAGNOSIS — F319 Bipolar disorder, unspecified: Secondary | ICD-10-CM | POA: Diagnosis present

## 2017-10-28 DIAGNOSIS — G4700 Insomnia, unspecified: Secondary | ICD-10-CM | POA: Diagnosis present

## 2017-10-28 DIAGNOSIS — F1010 Alcohol abuse, uncomplicated: Secondary | ICD-10-CM | POA: Diagnosis not present

## 2017-10-28 MED ORDER — FUROSEMIDE 20 MG PO TABS
20.0000 mg | ORAL_TABLET | Freq: Every day | ORAL | Status: DC
  Administered 2017-10-29 – 2017-11-02 (×5): 20 mg via ORAL
  Filled 2017-10-28: qty 7
  Filled 2017-10-28 (×5): qty 1
  Filled 2017-10-28 (×2): qty 7

## 2017-10-28 MED ORDER — HYDROXYZINE HCL 25 MG PO TABS
25.0000 mg | ORAL_TABLET | Freq: Three times a day (TID) | ORAL | Status: DC | PRN
  Administered 2017-10-28 – 2017-11-02 (×9): 25 mg via ORAL
  Filled 2017-10-28 (×4): qty 1
  Filled 2017-10-28: qty 10
  Filled 2017-10-28 (×6): qty 1

## 2017-10-28 MED ORDER — LORAZEPAM 1 MG PO TABS
1.0000 mg | ORAL_TABLET | Freq: Four times a day (QID) | ORAL | Status: DC
  Administered 2017-10-28 – 2017-10-29 (×3): 1 mg via ORAL
  Filled 2017-10-28 (×3): qty 1

## 2017-10-28 MED ORDER — PENTOXIFYLLINE ER 400 MG PO TBCR
400.0000 mg | EXTENDED_RELEASE_TABLET | Freq: Three times a day (TID) | ORAL | Status: DC
  Administered 2017-10-28 – 2017-11-02 (×13): 400 mg via ORAL
  Filled 2017-10-28: qty 1
  Filled 2017-10-28 (×2): qty 14
  Filled 2017-10-28: qty 1
  Filled 2017-10-28: qty 14
  Filled 2017-10-28: qty 1
  Filled 2017-10-28 (×2): qty 14
  Filled 2017-10-28 (×3): qty 1
  Filled 2017-10-28: qty 14
  Filled 2017-10-28 (×3): qty 1
  Filled 2017-10-28: qty 14
  Filled 2017-10-28: qty 1
  Filled 2017-10-28: qty 14
  Filled 2017-10-28: qty 1
  Filled 2017-10-28: qty 14

## 2017-10-28 MED ORDER — ALUM & MAG HYDROXIDE-SIMETH 200-200-20 MG/5ML PO SUSP
30.0000 mL | ORAL | Status: DC | PRN
  Administered 2017-10-28 – 2017-10-30 (×4): 30 mL via ORAL
  Filled 2017-10-28 (×4): qty 30

## 2017-10-28 MED ORDER — MAGNESIUM HYDROXIDE 400 MG/5ML PO SUSP: 30.0000 mL | Freq: Every day | ORAL | Status: DC | PRN

## 2017-10-28 MED ORDER — ACETAMINOPHEN 325 MG PO TABS: 650.0000 mg | ORAL_TABLET | Freq: Four times a day (QID) | ORAL | Status: DC | PRN

## 2017-10-28 MED ORDER — HALOPERIDOL 2 MG PO TABS
2.0000 mg | ORAL_TABLET | Freq: Every day | ORAL | Status: DC
  Administered 2017-10-28: 2 mg via ORAL
  Filled 2017-10-28 (×3): qty 1

## 2017-10-28 MED ORDER — LORAZEPAM 1 MG PO TABS: 1.0000 mg | ORAL_TABLET | Freq: Four times a day (QID) | ORAL | Status: DC | PRN

## 2017-10-28 MED ORDER — VITAMIN B-1 100 MG PO TABS
100.0000 mg | ORAL_TABLET | Freq: Every day | ORAL | Status: DC
  Administered 2017-10-29: 100 mg via ORAL
  Filled 2017-10-28 (×4): qty 1

## 2017-10-28 MED ORDER — ONDANSETRON 4 MG PO TBDP
4.0000 mg | ORAL_TABLET | Freq: Four times a day (QID) | ORAL | Status: AC | PRN
  Administered 2017-10-28 – 2017-10-30 (×3): 4 mg via ORAL
  Filled 2017-10-28 (×3): qty 1

## 2017-10-28 MED ORDER — LORAZEPAM 1 MG PO TABS: 1.0000 mg | ORAL_TABLET | Freq: Three times a day (TID) | ORAL | Status: DC

## 2017-10-28 MED ORDER — LORAZEPAM 1 MG PO TABS: 1.0000 mg | ORAL_TABLET | Freq: Every day | ORAL | Status: DC

## 2017-10-28 MED ORDER — LORAZEPAM 1 MG PO TABS: 1.0000 mg | ORAL_TABLET | Freq: Two times a day (BID) | ORAL | Status: DC

## 2017-10-28 MED ORDER — ADULT MULTIVITAMIN W/MINERALS CH
1.0000 | ORAL_TABLET | Freq: Every day | ORAL | Status: DC
  Administered 2017-10-28 – 2017-10-29 (×2): 1 via ORAL
  Filled 2017-10-28 (×5): qty 1

## 2017-10-28 MED ORDER — BUSPIRONE HCL 15 MG PO TABS
7.5000 mg | ORAL_TABLET | Freq: Every day | ORAL | Status: DC
  Administered 2017-10-29: 7.5 mg via ORAL
  Filled 2017-10-28 (×3): qty 1

## 2017-10-28 MED ORDER — TOPIRAMATE 25 MG PO TABS
50.0000 mg | ORAL_TABLET | Freq: Every day | ORAL | Status: DC
  Administered 2017-10-29 – 2017-11-02 (×5): 50 mg via ORAL
  Filled 2017-10-28 (×2): qty 2
  Filled 2017-10-28: qty 14
  Filled 2017-10-28: qty 2
  Filled 2017-10-28 (×2): qty 14
  Filled 2017-10-28: qty 2

## 2017-10-28 MED ORDER — LOPERAMIDE HCL 2 MG PO CAPS
2.0000 mg | ORAL_CAPSULE | ORAL | Status: AC | PRN
  Administered 2017-10-31: 2 mg via ORAL
  Filled 2017-10-28: qty 1

## 2017-10-28 MED ORDER — TRAZODONE HCL 50 MG PO TABS
50.0000 mg | ORAL_TABLET | Freq: Every evening | ORAL | Status: DC | PRN
  Administered 2017-10-28 – 2017-10-31 (×4): 50 mg via ORAL
  Filled 2017-10-28 (×2): qty 1
  Filled 2017-10-28: qty 7
  Filled 2017-10-28: qty 1

## 2017-10-28 NOTE — Progress Notes (Signed)
Adult Psychoeducational Group Note  Date:  10/28/2017 Time:  10:12 PM  Group Topic/Focus:  Wrap-Up Group:   The focus of this group is to help patients review their daily goal of treatment and discuss progress on daily workbooks.  Participation Level:  Active  Participation Quality:  Appropriate  Affect:  Appropriate  Cognitive:  Appropriate  Insight: Appropriate  Engagement in Group:  Engaged  Modes of Intervention:  Discussion  Additional Comments:  Patient attended wrap-up group and participated.  Talma Aguillard W Kierre Hintz [id number], 10:12 PM

## 2017-10-28 NOTE — Progress Notes (Signed)
Briana Sweeney is a 47 year old female being admitted voluntarily to [facility] from [facility] ED.  She came to the ED for suicidal ideation and substance abuse.  During [facility] admission, she admitted to drinking at least 8 20 oz beers per day, stopped taking her Zoloft because it wasn't helping and ongoing depression since the death of her boyfriend 06/05/17.  She feels like she doesn't have any supports.  She continues to report suicidal ideation and will contract for safety on the unit.  She denies HI or A/V hallucinations at this time.  She does report hearing voices or seeing thing on occasion.  She was very bizarre during admission, affect flat/dull.  She seemed confused and would ask the same questions over and over again.  Vital signs were stable.  She did complain of feeling like she is going through withdrawal.  She was noted to have tremors.  Oriented her to the unit.  Admission paperwork completed and signed.  Belongings searched and secured in locker #56, no contraband found.  Skin assessment completed and no skin issues noted.  Q 15 minute checks initiated for safety.  We will continue to monitor the progress towards her goals.

## 2017-10-28 NOTE — Tx Team (Signed)
Initial Treatment Plan 10/28/2017 7:46 PM Manfred Shirts GEF:[id number]    PATIENT STRESSORS: Financial difficulties Health problems Marital or family conflict Substance abuse   PATIENT STRENGTHS: Curator fund of knowledge   PATIENT IDENTIFIED PROBLEMS: Suicidal ideation  Depression  Substance abuse  "Get back to my normal state"  "Make sure my medications are all right"             DISCHARGE CRITERIA:  Improved stabilization in mood, thinking, and/or behavior Verbal commitment to aftercare and medication compliance Withdrawal symptoms are absent or subacute and managed without 24-hour nursing intervention  PRELIMINARY DISCHARGE PLAN: Outpatient therapy Medication management  PATIENT/FAMILY INVOLVEMENT: This treatment plan has been presented to and reviewed with the patient, Briana Sweeney.  The patient and family have been given the opportunity to ask questions and make suggestions.  Windell Moment, RN 10/28/2017, 7:46 PM

## 2017-10-29 DIAGNOSIS — Z818 Family history of other mental and behavioral disorders: Secondary | ICD-10-CM

## 2017-10-29 DIAGNOSIS — F322 Major depressive disorder, single episode, severe without psychotic features: Principal | ICD-10-CM

## 2017-10-29 DIAGNOSIS — R450 Nervousness: Secondary | ICD-10-CM

## 2017-10-29 DIAGNOSIS — F419 Anxiety disorder, unspecified: Secondary | ICD-10-CM

## 2017-10-29 DIAGNOSIS — F1020 Alcohol dependence, uncomplicated: Secondary | ICD-10-CM | POA: Diagnosis present

## 2017-10-29 DIAGNOSIS — Z811 Family history of alcohol abuse and dependence: Secondary | ICD-10-CM

## 2017-10-29 DIAGNOSIS — Z634 Disappearance and death of family member: Secondary | ICD-10-CM

## 2017-10-29 DIAGNOSIS — Z560 Unemployment, unspecified: Secondary | ICD-10-CM

## 2017-10-29 DIAGNOSIS — F1024 Alcohol dependence with alcohol-induced mood disorder: Secondary | ICD-10-CM

## 2017-10-29 LAB — CBC WITH DIFFERENTIAL/PLATELET
Band Neutrophils: 0 %
Basophils Absolute: 0 10*3/uL (ref 0.0–0.1)
Basophils Relative: 0 %
Blasts: 0 %
Eosinophils Absolute: 0 10*3/uL (ref 0.0–0.7)
Eosinophils Relative: 1 %
HCT: 39.8 % (ref 36.0–46.0)
Hemoglobin: 12.7 g/dL (ref 12.0–15.0)
Lymphocytes Relative: 29 %
Lymphs Abs: 1.4 10*3/uL (ref 0.7–4.0)
MCH: 29.3 pg (ref 26.0–34.0)
MCHC: 31.9 g/dL (ref 30.0–36.0)
MCV: 91.9 fL (ref 78.0–100.0)
Metamyelocytes Relative: 0 %
Monocytes Absolute: 0.5 10*3/uL (ref 0.1–1.0)
Monocytes Relative: 10 %
Myelocytes: 0 %
Neutro Abs: 2.8 10*3/uL (ref 1.7–7.7)
Neutrophils Relative %: 60 %
Other: 0 %
Platelets: DECREASED 10*3/uL (ref 150–400)
Promyelocytes Absolute: 0 %
RBC: 4.33 MIL/uL (ref 3.87–5.11)
RDW: 18.1 % — ABNORMAL HIGH (ref 11.5–15.5)
WBC: 4.7 10*3/uL (ref 4.0–10.5)
nRBC: 0 /100 WBC

## 2017-10-29 LAB — AMMONIA: Ammonia: 85 umol/L — ABNORMAL HIGH (ref 9–35)

## 2017-10-29 MED ORDER — HALOPERIDOL 5 MG PO TABS
2.5000 mg | ORAL_TABLET | Freq: Every day | ORAL | Status: DC
  Filled 2017-10-29 (×2): qty 0.5

## 2017-10-29 MED ORDER — LACTULOSE 10 GM/15ML PO SOLN
30.0000 g | Freq: Three times a day (TID) | ORAL | Status: DC | PRN
  Administered 2017-10-31: 30 g via ORAL

## 2017-10-29 MED ORDER — VITAMIN B-1 100 MG PO TABS
100.0000 mg | ORAL_TABLET | Freq: Every day | ORAL | Status: DC
  Administered 2017-10-30 – 2017-11-02 (×4): 100 mg via ORAL
  Filled 2017-10-29 (×7): qty 1

## 2017-10-29 MED ORDER — METRONIDAZOLE 500 MG PO TABS
500.0000 mg | ORAL_TABLET | Freq: Two times a day (BID) | ORAL | Status: DC
  Administered 2017-10-29 – 2017-11-02 (×9): 500 mg via ORAL
  Filled 2017-10-29: qty 10
  Filled 2017-10-29 (×2): qty 1
  Filled 2017-10-29: qty 10
  Filled 2017-10-29 (×3): qty 1
  Filled 2017-10-29 (×4): qty 10
  Filled 2017-10-29 (×2): qty 1

## 2017-10-29 MED ORDER — LACTULOSE 10 GM/15ML PO SOLN
30.0000 g | Freq: Three times a day (TID) | ORAL | Status: DC
  Administered 2017-10-29: 30 g via ORAL
  Filled 2017-10-29 (×6): qty 45

## 2017-10-29 MED ORDER — ADULT MULTIVITAMIN W/MINERALS CH
1.0000 | ORAL_TABLET | Freq: Every day | ORAL | Status: DC
  Administered 2017-10-30 – 2017-11-02 (×4): 1 via ORAL
  Filled 2017-10-29 (×8): qty 1

## 2017-10-29 MED ORDER — LORAZEPAM 1 MG PO TABS
1.0000 mg | ORAL_TABLET | Freq: Four times a day (QID) | ORAL | Status: DC | PRN
  Administered 2017-10-29 – 2017-10-31 (×4): 1 mg via ORAL
  Filled 2017-10-29 (×4): qty 1

## 2017-10-29 MED ORDER — FLUOXETINE HCL 20 MG PO CAPS
20.0000 mg | ORAL_CAPSULE | Freq: Every day | ORAL | Status: DC
  Administered 2017-10-29 – 2017-11-02 (×5): 20 mg via ORAL
  Filled 2017-10-29: qty 7
  Filled 2017-10-29 (×4): qty 1
  Filled 2017-10-29: qty 7
  Filled 2017-10-29: qty 1
  Filled 2017-10-29: qty 7

## 2017-10-29 MED ORDER — SPIRONOLACTONE 100 MG PO TABS
50.0000 mg | ORAL_TABLET | Freq: Every day | ORAL | Status: DC
  Administered 2017-10-29 – 2017-11-02 (×5): 50 mg via ORAL
  Filled 2017-10-29 (×3): qty 1
  Filled 2017-10-29: qty 4
  Filled 2017-10-29: qty 1
  Filled 2017-10-29 (×2): qty 4

## 2017-10-29 NOTE — [facility] Suicide Risk Assessment (Addendum)
[facility] Admission Suicide Risk Assessment   Nursing information obtained from:  Patient Demographic factors:  Caucasian, Low socioeconomic status Current Mental Status:  Suicidal ideation indicated by patient Loss Factors:  Decline in physical health, Financial problems / change in socioeconomic status Historical Factors:  Prior suicide attempts, Family history of suicide, Family history of mental illness or substance abuse Risk Reduction Factors:  NA  Total Time spent with patient: 45 minutes Principal Problem: MDD (major depressive disorder), severe ([place]) Diagnosis:   Patient Active Problem List   Diagnosis Date Noted  . Alcohol dependence ([place]) [F10.20] 10/29/2017  . MDD (major depressive disorder), severe ([place]) [F32.2] 10/28/2017  . Acute hyperactive alcohol withdrawal delirium ([place]) [F10.231] 10/09/2017  . Schizophrenia ([place]) [F20.9] 10/09/2017  . Alcohol abuse with alcohol-induced mood disorder ([place]) [F10.14]   . Alcohol withdrawal ([place]) [F10.239] 09/11/2017  . Esophageal varices without bleeding ([place]) [I85.00]   . Hematemesis [K92.0] 09/01/2017  . Ascites due to alcoholic cirrhosis ([place]) [J50.09]   . Decompensated hepatic cirrhosis ([place]) [K72.90] 07/23/2017  . Alcohol abuse [F10.10] 07/23/2017  . Hepatitis C, chronic ([place]) [B18.2] 07/23/2017  . Hypokalemia [E87.6] 07/23/2017  . Jaundice [R17] 07/23/2017  . Coagulopathy ([place]) [D68.9] 07/23/2017  . Hypomagnesemia [E83.42] 07/23/2017  . Pancytopenia ([place]) [F81.829] 07/23/2017  . Alcoholic cirrhosis of liver with ascites ([place]) [K70.31] 07/23/2017    Continued Clinical Symptoms:  Alcohol Use Disorder Identification Test Final Score (AUDIT): 32 The "Alcohol Use Disorders Identification Test", Guidelines for Use in Primary Care, Second Edition.  World Pharmacologist [facility]). Score between 0-7:  no or low risk or alcohol related problems. Score between 8-15:  moderate risk of alcohol related problems. Score between 16-19:  high  risk of alcohol related problems. Score 20 or above:  warrants further diagnostic evaluation for alcohol dependence and treatment.   CLINICAL FACTORS:  47 year old divorced female, has two adult children, unemployed, was living with roommate prior to admission. She presented to [facility] ED yesterday, voluntarily. States " I have not been feeling well". Reports she has been feeling depressed, particularly since her BF passed away 05/13/2017. Reports recent passive SI, with thoughts of " going to sleep and not waking up".  She describes neuro-vegetative symptoms of depression including sadness, poor sleep, poor appetite, some anhedonia.  She also reports history of alcohol dependence  and states " I need help to stop drinking, if I don't stop drinking I know I am going to die". Of note, at this time not presenting with significant symptoms of WDL- minimal distal tremors, no diaphoresis, BP 116/75, pulse 101.   She reports history of alcohol dependence, and states she has been drinking up to a fifth of liquor per day.She last drank on 2/16, when she went to ED. Denies drug abuse . 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   Reports history of depression, anxiety, and states she attempted suicide as a teenager. Reports history of depression and states she has intermittent visual and sometimes auditory hallucinations , which she states " come and go".   Medical History remarkable for history of Cirrhosis ( Alcohol Dependence, Hep C+). States she has had paracentesis in the past . She states she had been on Lasix, Aldactone prior to admission. She also states she had been on Dilantin in the past,but states she has not taken it recently  "not  in a while, because I only had  seizure when I stopped drinking ".   Labs reviewed- 2/20 ammonia elevated at 85.  Dx- Alcohol Dependence, Alcohol Induced Mood Disorder versus MDD  Plan- Inpatient admission Discontinue Buspar- states " it does not work, makes me feel  weird" Discontinue Haldol- patient reports she is not currently experiencing hallucinations and does so only in the context of alcohol intoxication or WDL, and may lower seizure threshold  Continue Prozac 20 mgrs QDAY for depression Reports last alcohol consumption 2/16, and does not present with current symptoms of WDL, but does report prior history of significant WDL symptoms during prior detoxicifications- start Ativan PRNs for potential WDL symptoms as needed.   I have discussed case with hospitalist consultant regarding management of Cirrhosis- recommendation is to continue home medications ( Lasix 20 mgrs QDAY, Aldactone 50 mgr QDAY, and to manage hyperammonemia with Lactulose PRNs until diarrhea or loose stools rather than on standing basis).  Will check CMP , CBC , Ammonia serum level in AM.   I have also spoken with Neurology consultant regarding history of Dilantin management- Dilantin is listed as one of her home medications. She states she has not been taking it recently. States she has had alcohol WDL seizures in the past, which she states have occurred the day after last alcohol use . As reviewed with Neurologist, would continue CIWA, not restart Phenytoin at this time.     Musculoskeletal: Strength & Muscle Tone: within normal limits  No psychomotor agitation or tremors at this time. Does not appear to be in any acute distress  Gait & Station: normal- gait is slow Patient leans: N/A  Psychiatric Specialty Exam: Physical Exam  ROS denies headache, denies chest pain, no shortness of breath, denies vomiting, denies GI bleed . Denies abdominal distention.   Blood pressure 116/75, pulse (!) 101, temperature 98.9 F (37.2 C), temperature source Oral, resp. rate 16, height 4' 11.5" (1.511 m), weight 55.8 kg (123 lb).Body mass index is 24.43 kg/m.  General Appearance: Fairly Groomed  Eye Contact:  Good  Speech:  Normal Rate  Volume:  Decreased  Mood:  Depressed  Affect:   constricted, blunted   Thought Process:  Linear and Descriptions of Associations: Intact  Orientation:  Other:  fully alert and attentive- she is oriented x 3  . No current presentation of delirium or confusion  Thought Content:  denies current hallucinations, and does not appear internally preoccupied at present . No delusions expressed .  Suicidal Thoughts:  No denies suicidal or self injurious ideations, denies homicidal or violent ideations   Homicidal Thoughts:  No  Memory:  recent and remote grossly intact  ( 3/3 immediate, 3/3 at 3 minutes )  Judgement:  Fair  Insight:  Fair  Psychomotor Activity:  currently decreased.No current psychomotor restlessness or agitation, no significant distal tremors  Concentration:  Concentration: Good and Attention Span: Good  Recall:  Good  Fund of Knowledge:  Good  Language:  Good  Akathisia:  Negative  Handed:  Right  AIMS (if indicated):     Assets:  Communication Skills Desire for Improvement Resilience  ADL's:  Intact  Cognition:  WNL  Sleep:  Number of Hours: 5.5      COGNITIVE FEATURES THAT CONTRIBUTE TO RISK:  Closed-mindedness and Loss of executive function    SUICIDE RISK:   Moderate:  Frequent suicidal ideation with limited intensity, and duration, some specificity in terms of plans, no associated intent, good self-control, limited dysphoria/symptomatology, some risk factors present, and identifiable protective factors, including available and accessible social support.  PLAN OF CARE: Patient will be admitted to inpatient  psychiatric unit for stabilization and safety. Will provide and encourage milieu participation. Provide medication management and maked adjustments as needed.  Will follow daily.    I certify that inpatient services furnished can reasonably be expected to improve the patient's condition.   Jenne Campus, MD 10/29/2017, 2:16 PM

## 2017-10-29 NOTE — Progress Notes (Signed)
Patient ID: Briana Sweeney, female   DOB: [birth year], 47 y.o.   MRN: [id number]  DAR: Pt. Denies SI/HI and A/V Hallucinations. She was asked to fill out her daily inventory sheet but did not do so. She appears disheveled today and is presenting with mild confusion. He pulse is elevated, NP Gaynelle Cage was notified. Patient does report anxiety, agitation, and visual harshness related to her withdrawal symptoms. Support and encouragement provided to the patient. Patient is seen in the milieu intermittently but does not interact with her peers. She is hyper-focused on medication especially Ativan at this time. Q15 minute checks are maintained for safety.

## 2017-10-29 NOTE — H&P (Addendum)
Psychiatric Admission Assessment Adult  Patient Identification: Briana Sweeney MRN:  [id number] Date of Evaluation:  10/29/2017 Chief Complaint:  MDD;SEVERE Alcohol Use Principal Diagnosis: MDD (major depressive disorder), severe ([place]) Diagnosis:   Patient Active Problem List   Diagnosis Date Noted  . Alcohol dependence ([place]) [F10.20] 10/29/2017  . MDD (major depressive disorder), severe ([place]) [F32.2] 10/28/2017  . Acute hyperactive alcohol withdrawal delirium ([place]) [F10.231] 10/09/2017  . Schizophrenia ([place]) [F20.9] 10/09/2017  . Alcohol abuse with alcohol-induced mood disorder ([place]) [F10.14]   . Alcohol withdrawal ([place]) [F10.239] 09/11/2017  . Esophageal varices without bleeding ([place]) [I85.00]   . Hematemesis [K92.0] 09/01/2017  . Ascites due to alcoholic cirrhosis ([place]) [W09.81]   . Decompensated hepatic cirrhosis ([place]) [K72.90] 07/23/2017  . Alcohol abuse [F10.10] 07/23/2017  . Hepatitis C, chronic ([place]) [B18.2] 07/23/2017  . Hypokalemia [E87.6] 07/23/2017  . Jaundice [R17] 07/23/2017  . Coagulopathy ([place]) [D68.9] 07/23/2017  . Hypomagnesemia [E83.42] 07/23/2017  . Pancytopenia ([place]) [X91.478] 07/23/2017  . Alcoholic cirrhosis of liver with ascites ([place]) [K70.31] 07/23/2017   History of Present Illness:  10/28/17 [facility] RN Assessment: 47 year old female being admitted voluntarily to [facility] from [facility] ED.  She came to the ED for suicidal ideation and substance abuse.  During [facility] admission, she admitted to drinking at least 8 20 oz beers per day, stopped taking her Zoloft because it wasn't helping and ongoing depression since the death of her boyfriend 2017-04-30.  She feels like she doesn't have any supports.  She continues to report suicidal ideation and will contract for safety on the unit.  She denies HI or A/V hallucinations at this time.  She does report hearing voices or seeing thing on occasion.  She was very bizarre during admission, affect flat/dull.  She seemed confused and would  ask the same questions over and over again.  Vital signs were stable.  She did complain of feeling like she is going through withdrawal.   Patient presents to [facility] from [facility] where she was since 10/25/17. She reports that she came to the hospital because she is ready to sop drinking. She reports that she drinks approximately 1/2 gallon of liquor a day with beer. She reports that she has a room she rents and that she wants to go through detox and then go to outpatient services. At this time she is refusing residential treatment. She reports that she follows up at [place] for psychiatric treatment. She states that the Haldol has worked for her for years and wants to continue it. Sh states that Prozac has worked well for her as well. She is informed of her elevated ammonia level and that she is started on Lactulose and she was diagnosed with Trich and started on Flagyl 500 mg BID x 7 days. She denies any SI/HI/AVH and contracts for safety.   Associated Signs/Symptoms: Depression Symptoms:  depressed mood, insomnia, feelings of worthlessness/guilt, hopelessness, suicidal thoughts without plan, anxiety, loss of energy/fatigue, disturbed sleep, decreased appetite, (Hypo) Manic Symptoms:  Flight of Ideas, Impulsivity, Anxiety Symptoms:  Excessive Worry, Panic Symptoms, Psychotic Symptoms:  Deneis any currently PTSD Symptoms: NA Total Time spent with patient: 45 minutes  Past Psychiatric History: Alcohol abuse since 47 years old, hospital admission as a teenager for suicide attempt, has been trialed on Zoloft, Lithium, Topamax (for seizure), and Klonopin. Hx of Cocaine abuse 6 months ago  Is the patient at risk to self? Yes.    Has the patient been a risk to self in the  past 6 months? No.  Has the patient been a risk to self within the distant past? Yes.    Is the patient a risk to others? No.  Has the patient been a risk to others in the past 6 months? No.  Has the patient  been a risk to others within the distant past? No.   Prior Inpatient Therapy: Prior Inpatient Therapy: No Prior Therapy Dates: NA Prior Therapy Facilty/Provider(s): NA Reason for Treatment: NA Prior Outpatient Therapy: Prior Outpatient Therapy: No Prior Therapy Dates: NA Prior Therapy Facilty/Provider(s): NA Reason for Treatment: NA Does patient have an ACCT team?: No Does patient have Intensive In-House Services?  : No Does patient have [facility] services? : No Does patient have P4CC services?: No  Alcohol Screening: 1. How often do you have a drink containing alcohol?: 4 or more times a week 2. How many drinks containing alcohol do you have on a typical day when you are drinking?: 10 or more 3. How often do you have six or more drinks on one occasion?: Daily or almost daily AUDIT-C Score: 12 4. How often during the last year have you found that you were not able to stop drinking once you had started?: Weekly 5. How often during the last year have you failed to do what was normally expected from you becasue of drinking?: Weekly 6. How often during the last year have you needed a first drink in the morning to get yourself going after a heavy drinking session?: Daily or almost daily 7. How often during the last year have you had a feeling of guilt of remorse after drinking?: Weekly 8. How often during the last year have you been unable to remember what happened the night before because you had been drinking?: Weekly 9. Have you or someone else been injured as a result of your drinking?: No 10. Has a relative or friend or a doctor or another health worker been concerned about your drinking or suggested you cut down?: Yes, during the last year Alcohol Use Disorder Identification Test Final Score (AUDIT): 32 Intervention/Follow-up: Alcohol Education Substance Abuse History in the last 12 months:  Yes.   Consequences of Substance Abuse: Medical Consequences:  reviewed Legal Consequences:   reviewed Family Consequences:  reviewed Withdrawal Symptoms:   Diarrhea Previous Psychotropic Medications: Yes Lithium, Klonopin, Prozac, Zoloft, Haldol Psychological Evaluations: Yes  Past Medical History:  Past Medical History:  Diagnosis Date  . Anxiety   . Bipolar affective disorder ([place])    With anxiety features  . Cirrhosis of liver ([place])    Due to alcohol and hepatitis C  . Depression   . GERD (gastroesophageal reflux disease)   . Hepatitis C 2018   hepatitis c and alcohol related hepatitis  . History of blood transfusion    "blood doesn't clot; I fell down and had to have a transfusion"  . History of kidney stones   . Migraine    "when I get really stressed" (09/01/2017)  . Schizophrenia ([place])   . Seizures ([place])    "when I run out of my RX; lots recently" (09/01/2017)    Past Surgical History:  Procedure Laterality Date  . ESOPHAGOGASTRODUODENOSCOPY N/A 09/03/2017   Procedure: ESOPHAGOGASTRODUODENOSCOPY (EGD);  Surgeon: Doran Stabler, MD;  Location: [facility];  Service: Gastroenterology;  Laterality: N/A;  . FINGER FRACTURE SURGERY Left    "shattered my pinky"  . FRACTURE SURGERY    . IR PARACENTESIS  07/23/2017  . IR PARACENTESIS  07/2017   "  did it twice in the same week" (09/01/2017)  . SHOULDER OPEN ROTATOR CUFF REPAIR Right   . TUBAL LIGATION    . VAGINAL HYSTERECTOMY     Family History:  Family History  Problem Relation Age of Onset  . Lung cancer Mother 51  . Alcohol abuse Mother   . Throat cancer Father 59   Family Psychiatric  History: Mom- Schizophrenia, Daughter - Bipolar and schizophrenia Tobacco Screening: Have you used any form of tobacco in the last 30 days? (Cigarettes, Smokeless Tobacco, Cigars, and/or Pipes): No Social History:  Social History   Substance and Sexual Activity  Alcohol Use Yes  . Alcohol/week: 268.8 oz  . Types: 448 Shots of liquor per week   Comment: 09/01/2017 "maybe 1/2 gallon vodka/day"     Social History    Substance and Sexual Activity  Drug Use No    Additional Social History: Marital status: Single    Pain Medications: See MAR Prescriptions: See MAR Over the Counter: See MAR History of alcohol / drug use?: Yes Longest period of sobriety (when/how long): unknown Name of Substance 1: alcohol 1 - Amount (size/oz): fifth of liquor 1 - Frequency: daily 1 - Duration: "months" 1 - Last Use / Amount: 10/25/17                  Allergies:  No Known Allergies Lab Results:  Results for orders placed or performed during the hospital encounter of 10/28/17 (from the past 48 hour(s))  Ammonia     Status: Abnormal   Collection Time: 10/29/17  6:50 AM  Result Value Ref Range   Ammonia 85 (H) 9 - 35 umol/L    Comment: Performed at [facility], [place] [address] [place], [place] [address]    Blood Alcohol level:  Lab Results  Component Value Date   ETH 253 (H) 10/22/2017   ETH <10 94/85/4627    Metabolic Disorder Labs:  Lab Results  Component Value Date   HGBA1C 4.9 10/21/2017   No results found for: PROLACTIN No results found for: CHOL, TRIG, HDL, CHOLHDL, VLDL, LDLCALC  Current Medications: Current Facility-Administered Medications  Medication Dose Route Frequency Provider Last Rate Last Dose  . acetaminophen (TYLENOL) tablet 650 mg  650 mg Oral Q6H PRN Okonkwo, Justina A, NP      . alum & mag hydroxide-simeth (MAALOX/MYLANTA) 200-200-20 MG/5ML suspension 30 mL  30 mL Oral Q4H PRN Okonkwo, Justina A, NP   30 mL at 10/29/17 0423  . busPIRone (BUSPAR) tablet 7.5 mg  7.5 mg Oral Daily Okonkwo, Justina A, NP   7.5 mg at 10/29/17 0817  . FLUoxetine (PROZAC) capsule 20 mg  20 mg Oral Daily Money, Darnelle Maffucci B, FNP      . furosemide (LASIX) tablet 20 mg  20 mg Oral Daily Okonkwo, Justina A, NP   20 mg at 10/29/17 0818  . haloperidol (HALDOL) tablet 2.5 mg  2.5 mg Oral QHS Izediuno, Vincent A, MD      . hydrOXYzine (ATARAX/VISTARIL) tablet 25 mg  25 mg Oral TID PRN  Lu Duffel, Justina A, NP   25 mg at 10/28/17 1708  . lactulose (CHRONULAC) 10 GM/15ML solution 30 g  30 g Oral TID Money, Darnelle Maffucci B, FNP   30 g at 10/29/17 1148  . loperamide (IMODIUM) capsule 2-4 mg  2-4 mg Oral PRN Okonkwo, Justina A, NP      . LORazepam (ATIVAN) tablet 1 mg  1 mg Oral Q6H PRN Okonkwo, Justina A, NP      .  magnesium hydroxide (MILK OF MAGNESIA) suspension 30 mL  30 mL Oral Daily PRN Okonkwo, Justina A, NP      . metroNIDAZOLE (FLAGYL) tablet 500 mg  500 mg Oral Q12H Money, Darnelle Maffucci B, FNP   500 mg at 10/29/17 1148  . multivitamin with minerals tablet 1 tablet  1 tablet Oral Daily Okonkwo, Justina A, NP   1 tablet at 10/29/17 0818  . ondansetron (ZOFRAN-ODT) disintegrating tablet 4 mg  4 mg Oral Q6H PRN Okonkwo, Justina A, NP   4 mg at 10/28/17 2328  . pentoxifylline (TRENTAL) CR tablet 400 mg  400 mg Oral TID WC Okonkwo, Justina A, NP   400 mg at 10/29/17 1148  . thiamine (VITAMIN B-1) tablet 100 mg  100 mg Oral Daily Okonkwo, Justina A, NP   100 mg at 10/29/17 0818  . topiramate (TOPAMAX) tablet 50 mg  50 mg Oral Daily Okonkwo, Justina A, NP   50 mg at 10/29/17 0818  . traZODone (DESYREL) tablet 50 mg  50 mg Oral QHS PRN Lu Duffel, Justina A, NP   50 mg at 10/28/17 2117   PTA Medications: Medications Prior to Admission  Medication Sig Dispense Refill Last Dose  . busPIRone (BUSPAR) 7.5 MG tablet Take 1 tablet (7.5 mg total) by mouth 2 (two) times daily. (Patient taking differently: Take 7.5 mg by mouth daily. ) 60 tablet 0 10/28/2017 at Unknown time  . haloperidol (HALDOL) 2 MG tablet Take 1 tablet (2 mg total) by mouth at bedtime. 90 tablet 0 10/27/2017 at Unknown time  . pantoprazole (PROTONIX) 40 MG tablet Take 1 tablet (40 mg total) by mouth daily. 30 tablet 2 Past Month at Unknown time  . pentoxifylline (TRENTAL) 400 MG CR tablet Take 1 tablet (400 mg total) by mouth 3 (three) times daily with meals. 90 tablet 0 10/27/2017 at Unknown time  . phenytoin (DILANTIN) 100 MG ER capsule  Take 3 capsules (300 mg total) by mouth at bedtime. 90 capsule 0 10/27/2017 at Unknown time  . spironolactone (ALDACTONE) 50 MG tablet Take 1 tablet (50 mg total) by mouth daily. 90 tablet 2 Past Week at Unknown time  . topiramate (TOPAMAX) 50 MG tablet Take 1 tablet (50 mg total) by mouth daily. 90 tablet 1 10/28/2017 at Unknown time  . chlordiazePOXIDE (LIBRIUM) 10 MG capsule Take librium '10mg'$  twice a day for 2 days, then daily for 3 days then OFF (Patient not taking: Reported on 10/28/2017) 7 capsule 0 Not Taking at Unknown time  . folic acid (FOLVITE) 1 MG tablet Take 1 tablet (1 mg total) by mouth daily. (Patient not taking: Reported on 10/28/2017) 30 tablet 1 Not Taking at Unknown time  . furosemide (LASIX) 20 MG tablet Take 1 tablet (20 mg total) by mouth daily. 90 tablet 2 10/25/2017  . HYDROcodone-acetaminophen (NORCO/VICODIN) 5-325 MG tablet Take 1 tablet by mouth every 6 (six) hours as needed for moderate pain. (Patient not taking: Reported on 10/28/2017) 10 tablet 0 Not Taking at Unknown time  . metroNIDAZOLE (FLAGYL) 500 MG tablet Take 1 tablet (500 mg total) by mouth 2 (two) times daily. (Patient not taking: Reported on 10/28/2017) 14 tablet 0 Not Taking at Unknown time  . thiamine 100 MG tablet Take 1 tablet (100 mg total) by mouth daily. (Patient not taking: Reported on 10/28/2017) 30 tablet 1 Not Taking at Unknown time    Musculoskeletal: Strength & Muscle Tone: decreased Gait & Station: normal Patient leans: N/A  Psychiatric Specialty Exam: Physical Exam  Nursing  note and vitals reviewed. Constitutional: She is oriented to person, place, and time. She appears well-developed and well-nourished.  Respiratory: Effort normal.  Musculoskeletal: Normal range of motion.  Neurological: She is alert and oriented to person, place, and time.  Skin: Skin is warm.    Review of Systems  Constitutional: Negative.   HENT: Negative.   Eyes: Negative.   Respiratory: Negative.   Cardiovascular:  Negative.   Gastrointestinal: Positive for diarrhea.  Genitourinary: Negative.   Musculoskeletal: Negative.   Skin: Negative.   Neurological: Negative.   Endo/Heme/Allergies: Negative.   Psychiatric/Behavioral: Positive for depression. Negative for hallucinations and suicidal ideas. The patient is nervous/anxious.     Blood pressure 116/75, pulse (!) 101, temperature 98.9 F (37.2 C), temperature source Oral, resp. rate 16, height 4' 11.5" (1.511 m), weight 55.8 kg (123 lb).Body mass index is 24.43 kg/m.  General Appearance: Disheveled  Eye Contact:  Good  Speech:  Clear and Coherent and Slow  Volume:  Decreased  Mood:  Anxious and Depressed  Affect:  Depressed and Flat  Thought Process:  Goal Directed and Descriptions of Associations: Intact  Orientation:  Full (Time, Place, and Person)  Thought Content:  WDL  Suicidal Thoughts:  No  Homicidal Thoughts:  No  Memory:  Immediate;   Good Recent;   Good Remote;   Good  Judgement:  Good  Insight:  Good  Psychomotor Activity:  Normal  Concentration:  Concentration: Good and Attention Span: Good  Recall:  Good  Fund of Knowledge:  Good  Language:  Good  Akathisia:  No  Handed:  Right  AIMS (if indicated):     Assets:  Communication Skills Desire for Improvement Financial Resources/Insurance Housing Physical Health Social Support Transportation  ADL's:  Intact  Cognition:  WNL  Sleep:  Number of Hours: 5.5    Treatment Plan Summary: Daily contact with patient to assess and evaluate symptoms and progress in treatment, Medication management and Plan is to:  -Encourage group therapy participation -See SRA and MAR for medication management  Observation Level/Precautions:  15 minute checks  Laboratory:  Reviewed  Psychotherapy:  Group therapy  Medications:  See [facility]  Consultations:  As needed  Discharge Concerns:  Relapse  Estimated LOS: 3-5 Days  Other:  Admit to [facility] for Primary  Diagnosis: MDD (major depressive disorder), severe ([place]) Long Term Goal(s): Improvement in symptoms so as ready for discharge  Short Term Goals: Ability to disclose and discuss suicidal ideas and Ability to demonstrate self-control will improve  Physician Treatment Plan for Secondary Diagnosis: Principal Problem:   MDD (major depressive disorder), severe ([place]) Active Problems:   Schizophrenia ([place])   Alcohol dependence ([place])  Long Term Goal(s): Improvement in symptoms so as ready for discharge  Short Term Goals: Ability to identify and develop effective coping behaviors will improve, Ability to maintain clinical measurements within normal limits will improve and Ability to identify triggers associated with substance abuse/mental health issues will improve  I certify that inpatient services furnished can reasonably be expected to improve the patient's condition.    Lewis Shock, FNP 2/20/20191:54 PM   I have discussed case with NP and have met with patient  Agree with NP note and assessment  47 year old divorced female, has two adult children, unemployed, was living with roommate prior to admission. She presented to [facility] ED yesterday, voluntarily. States " I have not been feeling well". Reports she has been feeling depressed, particularly since her  BF passed away 04/2017. Reports recent passive SI, with thoughts of " going to sleep and not waking up".  She describes neuro-vegetative symptoms of depression including sadness, poor sleep, poor appetite, some anhedonia.  She also reports history of alcohol dependence  and states " I need help to stop drinking, if I don't stop drinking I know I am going to die". Of note, at this time not presenting with significant symptoms of WDL- minimal distal tremors, no diaphoresis, BP 116/75, pulse 101.   She reports history of alcohol dependence, and states she has been drinking up to a fifth of liquor per day.She last drank on 2/16, when she went  to ED. Denies drug abuse . 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   Reports history of depression, anxiety, and states she attempted suicide as a teenager. Reports history of depression and states she has intermittent visual and sometimes auditory hallucinations , which she states " come and go".   Medical History remarkable for history of Cirrhosis ( Alcohol Dependence, Hep C+). States she has had paracentesis in the past . She states she had been on Lasix, Aldactone prior to admission. She also states she had been on Dilantin in the past,but states she has not taken it recently  "not  in a while, because I only had  seizure when I stopped drinking ".   Labs reviewed- 2/20 ammonia elevated at 85.   Dx- Alcohol Dependence, Alcohol Induced Mood Disorder versus MDD  Plan- Inpatient admission Discontinue Buspar- states " it does not work, makes me feel weird" Discontinue Haldol- patient reports she is not currently experiencing hallucinations and does so only in the context of alcohol intoxication or WDL, and may lower seizure threshold  Continue Prozac 20 mgrs QDAY for depression Reports last alcohol consumption 2/16, and does not present with current symptoms of WDL, but does report prior history of significant WDL symptoms during prior detoxicifications- start Ativan PRNs for potential WDL symptoms as needed.   I have discussed case with hospitalist consultant regarding management of Cirrhosis- recommendation is to continue home medications ( Lasix 20 mgrs QDAY, Aldactone 50 mgr QDAY, and to manage hyperammonemia with Lactulose PRNs until diarrhea or loose stools rather than on standing basis).  Will check CMP , CBC , Ammonia serum level in AM.   I have also spoken with Neurology consultant regarding history of Dilantin management- Dilantin is listed as one of her home medications. She states she has not been taking it recently. States she has had alcohol WDL seizures in the past, which  she states have occurred the day after last alcohol use . As reviewed with Neurologist, would continue CIWA, not restart Phenytoin at this time.

## 2017-10-29 NOTE — Progress Notes (Signed)
Recreation Therapy Notes  Date: 10/29/17 Time: 0930 Location: [address] Dayroom  Group Topic: Stress Management  Goal Area(s) Addresses:  Patient will verbalize importance of using healthy stress management.  Patient will identify positive emotions associated with healthy stress management.   Intervention: Stress Management  Activity :  [facility].  LRT introduced the stress management technique of guided imagery.  LRT read a script to allow patients to visualize the sights and sounds of being in the forest.  Patients were to follow along as the script was read to participate in activity.  Education: Stress Management, Discharge Planning.   Education Outcome: Acknowledges edcuation/In group clarification offered/Needs additional education  Clinical Observations/Feedback: Pt did not attend group.    Victorino Sparrow, LRT/CTRS         Victorino Sparrow A 10/29/2017 1:03 PM

## 2017-10-29 NOTE — Tx Team (Signed)
Interdisciplinary Treatment and Diagnostic Plan Update  10/29/2017 Time of Session: 9:30am Bonnell Placzek MRN: [id number]  Principal Diagnosis: <principal problem not specified>  Secondary Diagnoses: Active Problems:   MDD (major depressive disorder)   Current Medications:  Current Facility-Administered Medications  Medication Dose Route Frequency Provider Last Rate Last Dose  . acetaminophen (TYLENOL) tablet 650 mg  650 mg Oral Q6H PRN Okonkwo, Justina A, NP      . alum & mag hydroxide-simeth (MAALOX/MYLANTA) 200-200-20 MG/5ML suspension 30 mL  30 mL Oral Q4H PRN Okonkwo, Justina A, NP   30 mL at 10/29/17 0423  . busPIRone (BUSPAR) tablet 7.5 mg  7.5 mg Oral Daily Okonkwo, Justina A, NP   7.5 mg at 10/29/17 0817  . furosemide (LASIX) tablet 20 mg  20 mg Oral Daily Okonkwo, Justina A, NP   20 mg at 10/29/17 0818  . haloperidol (HALDOL) tablet 2 mg  2 mg Oral QHS Okonkwo, Justina A, NP   2 mg at 10/28/17 2117  . hydrOXYzine (ATARAX/VISTARIL) tablet 25 mg  25 mg Oral TID PRN Hughie Closs A, NP   25 mg at 10/28/17 1708  . loperamide (IMODIUM) capsule 2-4 mg  2-4 mg Oral PRN Okonkwo, Justina A, NP      . LORazepam (ATIVAN) tablet 1 mg  1 mg Oral Q6H PRN Okonkwo, Justina A, NP      . LORazepam (ATIVAN) tablet 1 mg  1 mg Oral QID Okonkwo, Justina A, NP   1 mg at 10/29/17 0818   Followed by  . [START ON 10/30/2017] LORazepam (ATIVAN) tablet 1 mg  1 mg Oral TID Hughie Closs A, NP       Followed by  . [START ON 10/31/2017] LORazepam (ATIVAN) tablet 1 mg  1 mg Oral BID Okonkwo, Justina A, NP       Followed by  . [START ON 11/01/2017] LORazepam (ATIVAN) tablet 1 mg  1 mg Oral Daily Okonkwo, Justina A, NP      . magnesium hydroxide (MILK OF MAGNESIA) suspension 30 mL  30 mL Oral Daily PRN Okonkwo, Justina A, NP      . multivitamin with minerals tablet 1 tablet  1 tablet Oral Daily Okonkwo, Justina A, NP   1 tablet at 10/29/17 0818  . ondansetron (ZOFRAN-ODT) disintegrating tablet 4 mg  4 mg  Oral Q6H PRN Okonkwo, Justina A, NP   4 mg at 10/28/17 2328  . pentoxifylline (TRENTAL) CR tablet 400 mg  400 mg Oral TID WC Okonkwo, Justina A, NP   400 mg at 10/29/17 3013  . thiamine (VITAMIN B-1) tablet 100 mg  100 mg Oral Daily Okonkwo, Justina A, NP   100 mg at 10/29/17 0818  . topiramate (TOPAMAX) tablet 50 mg  50 mg Oral Daily Okonkwo, Justina A, NP   50 mg at 10/29/17 0818  . traZODone (DESYREL) tablet 50 mg  50 mg Oral QHS PRN Lu Duffel, Justina A, NP   50 mg at 10/28/17 2117   PTA Medications: Medications Prior to Admission  Medication Sig Dispense Refill Last Dose  . busPIRone (BUSPAR) 7.5 MG tablet Take 1 tablet (7.5 mg total) by mouth 2 (two) times daily. (Patient taking differently: Take 7.5 mg by mouth daily. ) 60 tablet 0 10/28/2017 at Unknown time  . haloperidol (HALDOL) 2 MG tablet Take 1 tablet (2 mg total) by mouth at bedtime. 90 tablet 0 10/27/2017 at Unknown time  . pantoprazole (PROTONIX) 40 MG tablet Take 1 tablet (40 mg total) by mouth  daily. 30 tablet 2 Past Month at Unknown time  . pentoxifylline (TRENTAL) 400 MG CR tablet Take 1 tablet (400 mg total) by mouth 3 (three) times daily with meals. 90 tablet 0 10/27/2017 at Unknown time  . phenytoin (DILANTIN) 100 MG ER capsule Take 3 capsules (300 mg total) by mouth at bedtime. 90 capsule 0 10/27/2017 at Unknown time  . spironolactone (ALDACTONE) 50 MG tablet Take 1 tablet (50 mg total) by mouth daily. 90 tablet 2 Past Week at Unknown time  . topiramate (TOPAMAX) 50 MG tablet Take 1 tablet (50 mg total) by mouth daily. 90 tablet 1 10/28/2017 at Unknown time  . chlordiazePOXIDE (LIBRIUM) 10 MG capsule Take librium 71m twice a day for 2 days, then daily for 3 days then OFF (Patient not taking: Reported on 10/28/2017) 7 capsule 0 Not Taking at Unknown time  . folic acid (FOLVITE) 1 MG tablet Take 1 tablet (1 mg total) by mouth daily. (Patient not taking: Reported on 10/28/2017) 30 tablet 1 Not Taking at Unknown time  . furosemide  (LASIX) 20 MG tablet Take 1 tablet (20 mg total) by mouth daily. 90 tablet 2 10/25/2017  . HYDROcodone-acetaminophen (NORCO/VICODIN) 5-325 MG tablet Take 1 tablet by mouth every 6 (six) hours as needed for moderate pain. (Patient not taking: Reported on 10/28/2017) 10 tablet 0 Not Taking at Unknown time  . metroNIDAZOLE (FLAGYL) 500 MG tablet Take 1 tablet (500 mg total) by mouth 2 (two) times daily. (Patient not taking: Reported on 10/28/2017) 14 tablet 0 Not Taking at Unknown time  . thiamine 100 MG tablet Take 1 tablet (100 mg total) by mouth daily. (Patient not taking: Reported on 10/28/2017) 30 tablet 1 Not Taking at Unknown time    Patient Stressors: Financial difficulties Health problems Marital or family conflict Substance abuse  Patient Strengths: CCur[employer] of knowledge  Treatment Modalities: Medication Management, Group therapy, Case management,  1 to 1 session with clinician, Psychoeducation, Recreational therapy.   Physician Treatment Plan for Primary Diagnosis: <principal problem not specified> Medication Management: Evaluate patient's response, side effects, and tolerance of medication regimen.  Therapeutic Interventions: 1 to 1 sessions, Unit Group sessions and Medication administration.  Evaluation of Outcomes: Not Met  Physician Treatment Plan for Secondary Diagnosis: Active Problems:   MDD (major depressive disorder)  Medication Management: Evaluate patient's response, side effects, and tolerance of medication regimen.  Therapeutic Interventions: 1 to 1 sessions, Unit Group sessions and Medication administration.  Evaluation of Outcomes: Not Met   RN Treatment Plan for Primary Diagnosis: <principal problem not specified> Long Term Goal(s): Knowledge of disease and therapeutic regimen to maintain health will improve  Short Term Goals: Ability to remain free from injury will improve, Ability to verbalize frustration and anger appropriately  will improve, Ability to demonstrate self-control, Ability to participate in decision making will improve, Ability to verbalize feelings will improve, Ability to disclose and discuss suicidal ideas, Ability to identify and develop effective coping behaviors will improve and Compliance with prescribed medications will improve  Medication Management: RN will administer medications as ordered by provider, will assess and evaluate patient's response and provide education to patient for prescribed medication. RN will report any adverse and/or side effects to prescribing provider.  Therapeutic Interventions: 1 on 1 counseling sessions, Psychoeducation, Medication administration, Evaluate responses to treatment, Monitor vital signs and CBGs as ordered, Perform/monitor CIWA, COWS, AIMS and Fall Risk screenings as ordered, Perform wound care treatments as ordered.  Evaluation of Outcomes: Not Met  LCSW Treatment Plan for Primary Diagnosis: <principal problem not specified> Long Term Goal(s): Safe transition to appropriate next level of care at discharge, Engage patient in therapeutic group addressing interpersonal concerns.  Short Term Goals: Engage patient in aftercare planning with referrals and resources, Increase social support, Increase ability to appropriately verbalize feelings, Increase emotional regulation, Facilitate acceptance of mental health diagnosis and concerns, Facilitate patient progression through stages of change regarding substance use diagnoses and concerns, Identify triggers associated with mental health/substance abuse issues and Increase skills for wellness and recovery  Therapeutic Interventions: Assess for all discharge needs, 1 to 1 time with Social worker, Explore available resources and support systems, Assess for adequacy in community support network, Educate family and significant other(s) on suicide prevention, Complete Psychosocial Assessment, Interpersonal group  therapy.  Evaluation of Outcomes: Not Met   Progress in Treatment: Attending groups: No. New to unit Participating in groups: No. Taking medication as prescribed: Yes. Toleration medication: Yes. Family/Significant other contact made: No, will contact:  CSW will continue to assess for an appropiate contact Patient understands diagnosis: Yes. Discussing patient identified problems/goals with staff: Yes. Medical problems stabilized or resolved: Yes. Denies suicidal/homicidal ideation: Yes. Issues/concerns per patient self-inventory: Yes. Other:   New problem(s) identified: None  New Short Term/Long Term Goal(s): Detox, medication stabilization, elimination of SI thoughts, development of comprehensive mental wellness plan.   Patient Goal: Patient would not wake up or engage with staff for a treatment team meeting. CSW will continue to follow.   Discharge Plan or Barriers: CSW will continue to assess for an appropriate plan.   Reason for Continuation of Hospitalization: Anxiety Depression Medication stabilization Suicidal ideation  Estimated Length of Stay: Monday, 11/03/17  Attendees: Patient: 10/29/2017 8:23 AM  Physician: Dr. Neita Garnet 10/29/2017 8:23 AM  Nursing: Trinna Post, RN 10/29/2017 8:23 AM  RN Care Manager: 10/29/2017 8:23 AM  Social Worker: Radonna Ricker, [place] 10/29/2017 8:23 AM  Recreational Therapist:  10/29/2017 8:23 AM  Other:  10/29/2017 8:23 AM  Other:  10/29/2017 8:23 AM  Other: 10/29/2017 8:23 AM    Scribe for Treatment Team: Marylee Floras, [place] 10/29/2017 8:23 AM

## 2017-10-29 NOTE — [facility] Group Notes (Signed)
LCSW Group Therapy Note 10/29/2017 12:43 PM  Type of Therapy/Topic: Group Therapy: Feelings about Diagnosis  Participation Level: Did Not Attend   Description of Group:  This group will allow patients to explore their thoughts and feelings about diagnoses they have received. Patients will be guided to explore their level of understanding and acceptance of these diagnoses. Facilitator will encourage patients to process their thoughts and feelings about the reactions of others to their diagnosis and will guide patients in identifying ways to discuss their diagnosis with significant others in their lives. This group will be process-oriented, with patients participating in exploration of their own experiences, giving and receiving support, and processing challenge from other group members.  Therapeutic Goals: 1. Patient will demonstrate understanding of diagnosis as evidenced by identifying two or more symptoms of the disorder 2. Patient will be able to express two feelings regarding the diagnosis 3. Patient will demonstrate their ability to communicate their needs through discussion and/or role play  Summary of Patient Progress:  Invited, chose not to attend.   Therapeutic Modalities:  Cognitive Behavioral Therapy Brief Therapy Feelings Identification    [place] Clinical Social Worker

## 2017-10-29 NOTE — Progress Notes (Signed)
Upon writer's approach for shift assessment, pt presents with psychomotor retardation.  Her movements are slow and exaggerated.  She has poor eye contact.  She denies SI/HI/AVH at this time.  She reports she is having some abdominal pain for which she received ginger ale initially.  Writer reviewed her meds with her, and she voiced understanding.  Prior to shift change, staff reported that pt had given all her clothes to be washed, then staff found her in her room completely naked with just a towel across her.  When asked why she was naked, she said, "I forgot that all my clothes were in the wash".  Pt was given a "No Roommate" order d/t her bizarre behavior.  Support and encouragement offered.  Discharge plans are in process.  Safety maintained with q15 minute checks.

## 2017-10-29 NOTE — [facility] Group Notes (Signed)
[facility] Group Notes:  Mindfulness   Date:  10/29/2017  Time:  5:34 PM  Type of Therapy:  Psychoeducational Skills  Participation Level:  Did Not Attend  Participation Quality:  did not attend  Affect:  did not attend   Cognitive:  did not attend   Insight:  None  Engagement in Group:  None  Modes of Intervention:  Activity, Discussion and Education  Summary of Progress/Problems: Patient was invited to group but chose not to attend.   Gaylan Gerold E 10/29/2017, 5:34 PM

## 2017-10-29 NOTE — [facility] Counselor (Signed)
CSW attempted to meet with the patient to complete an assessment and discuss tentative discharge plans,  however the patient would not get out of bed, and did not respond to her name being called multiple times.   CSW will continue to follow.    Radonna Ricker, MSW, [place] Worker [facility]  Phone: [phone]

## 2017-10-30 DIAGNOSIS — F1020 Alcohol dependence, uncomplicated: Secondary | ICD-10-CM

## 2017-10-30 DIAGNOSIS — F1010 Alcohol abuse, uncomplicated: Secondary | ICD-10-CM

## 2017-10-30 DIAGNOSIS — A599 Trichomoniasis, unspecified: Secondary | ICD-10-CM | POA: Diagnosis present

## 2017-10-30 HISTORY — DX: Trichomoniasis, unspecified: A59.9

## 2017-10-30 LAB — COMPREHENSIVE METABOLIC PANEL
ALT: 17 U/L (ref 14–54)
AST: 36 U/L (ref 15–41)
Albumin: 3.1 g/dL — ABNORMAL LOW (ref 3.5–5.0)
Alkaline Phosphatase: 157 U/L — ABNORMAL HIGH (ref 38–126)
Anion gap: 11 (ref 5–15)
BUN: 5 mg/dL — ABNORMAL LOW (ref 6–20)
CO2: 20 mmol/L — ABNORMAL LOW (ref 22–32)
Calcium: 8.9 mg/dL (ref 8.9–10.3)
Chloride: 104 mmol/L (ref 101–111)
Creatinine, Ser: 0.72 mg/dL (ref 0.44–1.00)
GFR calc Af Amer: >60 mL/min (ref >60)
GFR calc non Af Amer: >60 mL/min (ref >60)
Glucose, Bld: 103 mg/dL — ABNORMAL HIGH (ref 65–99)
Potassium: 3.9 mmol/L (ref 3.5–5.1)
Sodium: 135 mmol/L (ref 135–145)
Total Bilirubin: 1.1 mg/dL (ref 0.3–1.2)
Total Protein: 8 g/dL (ref 6.5–8.1)

## 2017-10-30 LAB — AMMONIA: Ammonia: 79 umol/L — ABNORMAL HIGH (ref 9–35)

## 2017-10-30 NOTE — Progress Notes (Signed)
Pt did not attend evening wrap up group, remained in room during this time.

## 2017-10-30 NOTE — Plan of Care (Signed)
  Not Progressing Education: Knowledge of disease or condition will improve 10/30/2017 1707 - Not Progressing by Rockne Coons, RN Understanding of discharge needs will improve 10/30/2017 1707 - Not Progressing by Rockne Coons, RN

## 2017-10-30 NOTE — [facility] Group Notes (Signed)
LCSW Group Therapy Note 10/30/2017 12:55 PM  Type of Therapy/Topic: Group Therapy: Emotion Regulation  Participation Level: Did Not Attend   Description of Group:  The purpose of this group is to assist patients in learning to regulate negative emotions and experience positive emotions. Patients will be guided to discuss ways in which they have been vulnerable to their negative emotions. These vulnerabilities will be juxtaposed with experiences of positive emotions or situations, and patients will be challenged to use positive emotions to combat negative ones. Special emphasis will be placed on coping with negative emotions in conflict situations, and patients will process healthy conflict resolution skills.  Therapeutic Goals: 1. Patient will identify two positive emotions or experiences to reflect on in order to balance out negative emotions 2. Patient will label two or more emotions that they find the most difficult to experience 3. Patient will demonstrate positive conflict resolution skills through discussion and/or role plays  Summary of Patient Progress:  Invited, chose not to attend.    Therapeutic Modalities:  Cognitive Behavioral Therapy Feelings Identification Dialectical Behavioral Therapy   [place] Clinical Social Worker

## 2017-10-30 NOTE — Progress Notes (Signed)
Nursing Progress Note [id number]  D) Patient presents anxious and is isolative to her room this evening. Patient did not attend group. Patient denies SI/HI/AVH or pain. Patient contracts for safety on the unit. Patient requests Trazodone and Vistaril this evening.  A) Patient educated about and provided medication as scheduled or requested per provider's orders. Patient safety maintained with q15 min safety checks. High fall risk precautions in place. Emotional support given. 1:1 interaction and active listening provided. Snacks and fluids provided. Labs, vital signs and patient behavior monitored throughout shift. Patient encouraged to work on treatment plan.  R) Patient remains safe on the unit at this time. Patient agrees to make needs known to staff. Will continue to monitor and assess for changes.

## 2017-10-30 NOTE — Progress Notes (Addendum)
[facility] MD Progress Note  10/30/2017 12:48 PM Briana Sweeney  MRN:  [id number]   Subjective:  Patient reports that she is doing okay today but still feels pretty bad. She rates her depression at 8/10 and her anxiety at 8/10. She denies SI/HI/AVH and contracts for safety. She reports that her thought process is a little clearer today as well. She denies any medication side effects. She is denting any withdrawal symptoms.   Objective: Patient's chart and findings reviewed and discussed with treatment team. Patient presents in her room in the bed. She has been isolative and has limited eye contact today. She appears depressed. She is encouraged to attend groups and get out of her room more.  Ammonia level reviewed and has dropped to 79.   Principal Problem: MDD (major depressive disorder), severe ([place]) Diagnosis:   Patient Active Problem List   Diagnosis Date Noted  . Trichimoniasis [A59.9] 10/30/2017  . Alcohol dependence ([place]) [F10.20] 10/29/2017  . MDD (major depressive disorder), severe ([place]) [F32.2] 10/28/2017  . Acute hyperactive alcohol withdrawal delirium ([place]) [F10.231] 10/09/2017  . Schizophrenia ([name]) [F20.9] 10/09/2017  . Alcohol abuse with alcohol-induced mood disorder ([place]) [F10.14]   . Alcohol withdrawal ([place]) [F10.239] 09/11/2017  . Esophageal varices without bleeding ([place]) [I85.00]   . Hematemesis [K92.0] 09/01/2017  . Ascites due to alcoholic cirrhosis ([place]) [L27.51]   . Decompensated hepatic cirrhosis ([place]) [K72.90] 07/23/2017  . Alcohol abuse [F10.10] 07/23/2017  . Hepatitis C, chronic ([place]) [B18.2] 07/23/2017  . Hypokalemia [E87.6] 07/23/2017  . Jaundice [R17] 07/23/2017  . Coagulopathy ([place]) [D68.9] 07/23/2017  . Hypomagnesemia [E83.42] 07/23/2017  . Pancytopenia ([place]) [Z00.174] 07/23/2017  . Alcoholic cirrhosis of liver with ascites ([place]) [K70.31] 07/23/2017   Total Time spent with patient: 15 minutes  Past Psychiatric History: See H&P  Past Medical History:  Past  Medical History:  Diagnosis Date  . Anxiety   . Bipolar affective disorder ([place])    With anxiety features  . Cirrhosis of liver ([place])    Due to alcohol and hepatitis C  . Depression   . GERD (gastroesophageal reflux disease)   . Hepatitis C 2018   hepatitis c and alcohol related hepatitis  . History of blood transfusion    "blood doesn't clot; I fell down and had to have a transfusion"  . History of kidney stones   . Migraine    "when I get really stressed" (09/01/2017)  . Schizophrenia ([place])   . Seizures ([place])    "when I run out of my RX; lots recently" (09/01/2017)    Past Surgical History:  Procedure Laterality Date  . ESOPHAGOGASTRODUODENOSCOPY N/A 09/03/2017   Procedure: ESOPHAGOGASTRODUODENOSCOPY (EGD);  Surgeon: Doran Stabler, MD;  Location: [facility];  Service: Gastroenterology;  Laterality: N/A;  . FINGER FRACTURE SURGERY Left    "shattered my pinky"  . FRACTURE SURGERY    . IR PARACENTESIS  07/23/2017  . IR PARACENTESIS  07/2017   "did it twice in the same week" (09/01/2017)  . SHOULDER OPEN ROTATOR CUFF REPAIR Right   . TUBAL LIGATION    . VAGINAL HYSTERECTOMY     Family History:  Family History  Problem Relation Age of Onset  . Lung cancer Mother 17  . Alcohol abuse Mother   . Throat cancer Father 42   Family Psychiatric  History: See H&P Social History:  Social History   Substance and Sexual Activity  Alcohol Use Yes  . Alcohol/week: 268.8 oz  . Types: 448 Shots of liquor  per week   Comment: 09/01/2017 "maybe 1/2 gallon vodka/day"     Social History   Substance and Sexual Activity  Drug Use No    Social History   Socioeconomic History  . Marital status: Legally Separated    Spouse name: None  . Number of children: None  . Years of education: None  . Highest education level: None  Social Needs  . Financial resource strain: None  . Food insecurity - worry: None  . Food insecurity - inability: None  . Transportation needs -  medical: None  . Transportation needs - non-medical: None  Occupational History  . Occupation: applying for disability  Tobacco Use  . Smoking status: Never Smoker  . Smokeless tobacco: Never Used  Substance and Sexual Activity  . Alcohol use: Yes    Alcohol/week: 268.8 oz    Types: 448 Shots of liquor per week    Comment: 09/01/2017 "maybe 1/2 gallon vodka/day"  . Drug use: No  . Sexual activity: Not Currently  Other Topics Concern  . None  Social History Narrative   She moved with a boyfriend to [place] and was [facility].  He died of a massive heart attack in 8/18, per her report, and so she moved back to [place] and is living with a friend.   Additional Social History:    Pain Medications: See MAR Prescriptions: See MAR Over the Counter: See MAR History of alcohol / drug use?: Yes Longest period of sobriety (when/how long): unknown Name of Substance 1: alcohol 1 - Amount (size/oz): fifth of liquor 1 - Frequency: daily 1 - Duration: "months" 1 - Last Use / Amount: 10/25/17                  Sleep: Good  Appetite:  Good  Current Medications: Current Facility-Administered Medications  Medication Dose Route Frequency Provider Last Rate Last Dose  . alum & mag hydroxide-simeth (MAALOX/MYLANTA) 200-200-20 MG/5ML suspension 30 mL  30 mL Oral Q4H PRN Okonkwo, Justina A, NP   30 mL at 10/29/17 0423  . FLUoxetine (PROZAC) capsule 20 mg  20 mg Oral Daily Money, Lowry Ram, FNP   20 mg at 10/30/17 1104  . furosemide (LASIX) tablet 20 mg  20 mg Oral Daily Okonkwo, Justina A, NP   20 mg at 10/30/17 1104  . hydrOXYzine (ATARAX/VISTARIL) tablet 25 mg  25 mg Oral TID PRN Lu Duffel, Justina A, NP   25 mg at 10/29/17 2238  . lactulose (CHRONULAC) 10 GM/15ML solution 30 g  30 g Oral TID PRN Cobos, Myer Peer, MD      . loperamide (IMODIUM) capsule 2-4 mg  2-4 mg Oral PRN Okonkwo, Justina A, NP      . LORazepam (ATIVAN) tablet 1 mg  1 mg Oral Q6H PRN Cobos,  Myer Peer, MD   1 mg at 10/30/17 0148  . magnesium hydroxide (MILK OF MAGNESIA) suspension 30 mL  30 mL Oral Daily PRN Okonkwo, Justina A, NP      . metroNIDAZOLE (FLAGYL) tablet 500 mg  500 mg Oral Q12H Money, Darnelle Maffucci B, FNP   500 mg at 10/30/17 1104  . multivitamin with minerals tablet 1 tablet  1 tablet Oral Daily Cobos, Myer Peer, MD   1 tablet at 10/30/17 1104  . ondansetron (ZOFRAN-ODT) disintegrating tablet 4 mg  4 mg Oral Q6H PRN Okonkwo, Justina A, NP   4 mg at 10/30/17 1203  . pentoxifylline (TRENTAL) CR tablet 400 mg  400 mg  Oral TID WC Okonkwo, Justina A, NP   400 mg at 10/30/17 1105  . spironolactone (ALDACTONE) tablet 50 mg  50 mg Oral Daily Cobos, Myer Peer, MD   50 mg at 10/30/17 1103  . thiamine (VITAMIN B-1) tablet 100 mg  100 mg Oral Daily Cobos, Myer Peer, MD   100 mg at 10/30/17 1104  . topiramate (TOPAMAX) tablet 50 mg  50 mg Oral Daily Okonkwo, Justina A, NP   50 mg at 10/30/17 1104  . traZODone (DESYREL) tablet 50 mg  50 mg Oral QHS PRN Lu Duffel, Justina A, NP   50 mg at 10/29/17 2031    Lab Results:  Results for orders placed or performed during the hospital encounter of 10/28/17 (from the past 48 hour(s))  Ammonia     Status: Abnormal   Collection Time: 10/29/17  6:50 AM  Result Value Ref Range   Ammonia 85 (H) 9 - 35 umol/L    Comment: Performed at [facility], [place] [address] [place], [place] [address]  CBC with Differential/Platelet     Status: Abnormal   Collection Time: 10/29/17  6:12 PM  Result Value Ref Range   WBC 4.7 4.0 - 10.5 K/uL    Comment: WHITE COUNT CONFIRMED ON SMEAR   RBC 4.33 3.87 - 5.11 MIL/uL   Hemoglobin 12.7 12.0 - 15.0 g/dL   HCT 39.8 36.0 - 46.0 %   MCV 91.9 78.0 - 100.0 fL   MCH 29.3 26.0 - 34.0 pg   MCHC 31.9 30.0 - 36.0 g/dL   RDW 18.1 (H) 11.5 - 15.5 %   Platelets  150 - 400 K/uL    PLATELET CLUMPS NOTED ON SMEAR, COUNT APPEARS DECREASED   Neutrophils Relative % 60 %   Lymphocytes Relative 29 %   Monocytes  Relative 10 %   Eosinophils Relative 1 %   Basophils Relative 0 %   Band Neutrophils 0 %   Metamyelocytes Relative 0 %   Myelocytes 0 %   Promyelocytes Absolute 0 %   Blasts 0 %   nRBC 0 0 /100 WBC   Other 0 %   Neutro Abs 2.8 1.7 - 7.7 K/uL   Lymphs Abs 1.4 0.7 - 4.0 K/uL   Monocytes Absolute 0.5 0.1 - 1.0 K/uL   Eosinophils Absolute 0.0 0.0 - 0.7 K/uL   Basophils Absolute 0.0 0.0 - 0.1 K/uL   Smear Review MORPHOLOGY UNREMARKABLE     Comment: Performed at [facility], [place] [address] [place], [place] [address]  Comprehensive metabolic panel     Status: Abnormal   Collection Time: 10/30/17  6:24 AM  Result Value Ref Range   Sodium 135 135 - 145 mmol/L   Potassium 3.9 3.5 - 5.1 mmol/L   Chloride 104 101 - 111 mmol/L   CO2 20 (L) 22 - 32 mmol/L   Glucose, Bld 103 (H) 65 - 99 mg/dL   BUN 5 (L) 6 - 20 mg/dL   Creatinine, Ser 0.72 0.44 - 1.00 mg/dL   Calcium 8.9 8.9 - 10.3 mg/dL   Total Protein 8.0 6.5 - 8.1 g/dL   Albumin 3.1 (L) 3.5 - 5.0 g/dL   AST 36 15 - 41 U/L   ALT 17 14 - 54 U/L   Alkaline Phosphatase 157 (H) 38 - 126 U/L   Total Bilirubin 1.1 0.3 - 1.2 mg/dL   GFR calc non Af Amer >60 >60 mL/min   GFR calc Af Amer >60 >60 mL/min  Comment: (NOTE) The eGFR has been calculated using the CKD EPI equation. This calculation has not been validated in all clinical situations. eGFR's persistently <60 mL/min signify possible Chronic Kidney Disease.    Anion gap 11 5 - 15    Comment: Performed at [facility], [place] [address], [place], [place] [address]  Ammonia     Status: Abnormal   Collection Time: 10/30/17  6:24 AM  Result Value Ref Range   Ammonia 79 (H) 9 - 35 umol/L    Comment: Performed at [facility], [place] [address] [place], [place] [address]    Blood Alcohol level:  Lab Results  Component Value Date   ETH 253 (H) 10/22/2017   ETH <10 32/35/5732    Metabolic Disorder Labs: Lab Results  Component  Value Date   HGBA1C 4.9 10/21/2017   No results found for: PROLACTIN No results found for: CHOL, TRIG, HDL, CHOLHDL, VLDL, LDLCALC  Physical Findings: AIMS: Facial and Oral Movements Muscles of Facial Expression: None, normal Lips and Perioral Area: None, normal Jaw: None, normal Tongue: None, normal,Extremity Movements Upper (arms, wrists, hands, fingers): None, normal Lower (legs, knees, ankles, toes): None, normal, Trunk Movements Neck, shoulders, hips: None, normal, Overall Severity Severity of abnormal movements (highest score from questions above): None, normal Incapacitation due to abnormal movements: None, normal Patient's awareness of abnormal movements (rate only patient's report): No Awareness, Dental Status Current problems with teeth and/or dentures?: No Does patient usually wear dentures?: No  CIWA:  CIWA-Ar Total: 6 COWS:     Musculoskeletal: Strength & Muscle Tone: within normal limits Gait & Station: normal Patient leans: N/A  Psychiatric Specialty Exam: Physical Exam  Nursing note and vitals reviewed. Constitutional: She is oriented to person, place, and time. She appears well-developed and well-nourished.  Cardiovascular: Normal rate.  Respiratory: Effort normal.  Musculoskeletal: Normal range of motion.  Neurological: She is alert and oriented to person, place, and time.  Skin: Skin is warm.    Review of Systems  Constitutional: Negative.   HENT: Negative.   Eyes: Negative.   Respiratory: Negative.   Cardiovascular: Negative.   Gastrointestinal: Negative.   Genitourinary: Negative.   Musculoskeletal: Negative.   Skin: Negative.   Neurological: Negative.   Endo/Heme/Allergies: Negative.   Psychiatric/Behavioral: Positive for depression and substance abuse. Negative for hallucinations and suicidal ideas. The patient is nervous/anxious.     Blood pressure 109/85, pulse 97, temperature 98.5 F (36.9 C), temperature source Oral, resp. rate 16,  height 4' 11.5" (1.511 m), weight 55.8 kg (123 lb).Body mass index is 24.43 kg/m.  General Appearance: Casual  Eye Contact:  Good  Speech:  Clear and Coherent and Normal Rate  Volume:  Normal  Mood:  Depressed  Affect:  Flat  Thought Process:  Goal Directed and Descriptions of Associations: Intact  Orientation:  Full (Time, Place, and Person)  Thought Content:  WDL  Suicidal Thoughts:  No  Homicidal Thoughts:  No  Memory:  Immediate;   Good Recent;   Good Remote;   Good  Judgement:  Fair  Insight:  Fair  Psychomotor Activity:  Normal  Concentration:  Concentration: Good and Attention Span: Good  Recall:  Good  Fund of Knowledge:  Good  Language:  Good  Akathisia:  No  Handed:  Right  AIMS (if indicated):     Assets:  Communication Skills Desire for Improvement Financial Resources/Insurance Housing Physical Health Social Support Transportation  ADL's:  Intact  Cognition:  WNL  Sleep:  Number of Hours: 4.5   Problems Addressed: Schizophrenia MDD severe Alcohol dependence  Treatment Plan Summary: Daily contact with patient to assess and evaluate symptoms and progress in treatment, Medication management and Plan is to:  Continue Prozac 20 mg PO Daily for mood stability -Continue Lactulose 30 mg TID PRN for increased ammonia level -Continue Ativan 1 mg PRN CIWA >10 -Continue Topamax 50 mg PO Daily  -Continue Trazodone 50 mg PO QHS PRN for insomnia -Continue Vistaril 25 mg PO TID PRN for anxiety -Encourage group therapy participation  Lewis Shock, FNP 10/30/2017, 12:48 PM  Agree with NP Progress Note

## 2017-10-30 NOTE — Progress Notes (Signed)
Patient ID: Briana Sweeney, female   DOB: 06-17-1971, 47 y.o.   MRN: [id number]  DAR: Pt. Denies SI/HI and A/V Hallucinations. She reports that her sleep last night was poor, her appetite is poor, her energy level is low, and her concentration is poor. She rates her depression level is 8/10, her hopelessness level is 8/10, and her anxiety level is 10/10. Patient does report withdrawal symptoms including tremors, diarrhea, chilling, nausea, irritability, and runny nose. Support and encouragement provided to the patient. Scheduled and PRN medications administered to patient. Patient presents with sad affect and depressed mood. Q15 minute checks are maintained for safety.
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
## 2017-10-30 NOTE — BHH Suicide Risk Assessment (Signed)
[facility] INPATIENT:  Family/Significant Other Suicide Prevention Education  Suicide Prevention Education:  Patient Refusal for Family/Significant Other Suicide Prevention Education: The patient Briana Sweeney has refused to provide written consent for family/significant other to be provided Family/Significant Other Suicide Prevention Education during admission and/or prior to discharge.  Physician notified.  Frutoso Chase Tahani Potier 10/30/2017, 9:45 AM

## 2017-10-31 MED ORDER — BUSPIRONE HCL 15 MG PO TABS
7.5000 mg | ORAL_TABLET | Freq: Two times a day (BID) | ORAL | Status: DC
  Administered 2017-10-31 – 2017-11-02 (×5): 7.5 mg via ORAL
  Filled 2017-10-31 (×8): qty 1

## 2017-10-31 MED ORDER — BUSPIRONE HCL 15 MG PO TABS
7.5000 mg | ORAL_TABLET | Freq: Two times a day (BID) | ORAL | Status: DC
  Filled 2017-10-31 (×3): qty 1

## 2017-10-31 MED ORDER — BUSPIRONE HCL 15 MG PO TABS
7.5000 mg | ORAL_TABLET | Freq: Three times a day (TID) | ORAL | Status: DC
  Filled 2017-10-31 (×3): qty 14

## 2017-10-31 MED ORDER — BUSPIRONE HCL 7.5 MG PO TABS
7.5000 mg | ORAL_TABLET | Freq: Two times a day (BID) | ORAL | Status: DC
  Filled 2017-10-31: qty 14

## 2017-10-31 NOTE — Progress Notes (Addendum)
[facility] MD Progress Note  10/31/2017 11:45 AM Briana Sweeney  MRN:  [id number]   Subjective:  Patient reports ongoing anxiety, which she attributes in part to not being on Buspar at this time. States she was taking this medication prior to admission without side effects. She does report she feels better, and at this time is future oriented, and hoping for discharge soon. She denies medication side effects other than diarrhea related to lactulose .  She denies suicidal ideations.  Objective:  I have discussed case with treatment team and have met with patient. As per staff reports/notes, patient is presenting with gradual improvement. Affect has been blunted and milieu participation has been limited . Yesterday reported some lingering /ongoing WDL symptoms, but today does not endorse .  Patient is presenting with partial improvement, and today presents with improved eye contact, improved grooming and a partially improved/more reactive affect . She is attentive and alert, no current presentation of delirium. Ammonia remains elevated, but has decreased compared to prior ( 79) . We discussed importance of continuing Lactulose, and reviewed its desired effects on Ammonia. She stated she would continue to take it.  We discussed importance of alcohol abstinence as an integral part of treatment plan- patient denies cravings for alcohol and states " I know I have to stop drinking ". She is not currently interested in going to a residential setting or rehab .  Currently describes mood as improving, denies hallucinations, and does not appear internally preoccupied . States she feels her hallucinatory experiences were alcohol induced . No current WDL symptoms - no tremors, no diaphoresis, denies visual disturbances and vitals are stable.   Principal Problem: MDD (major depressive disorder), severe ([place]) Diagnosis:   Patient Active Problem List   Diagnosis Date Noted  . Trichimoniasis [A59.9] 10/30/2017  .  Alcohol dependence ([place]) [F10.20] 10/29/2017  . MDD (major depressive disorder), severe ([place]) [F32.2] 10/28/2017  . Acute hyperactive alcohol withdrawal delirium ([place]) [F10.231] 10/09/2017  . Schizophrenia ([place]) [F20.9] 10/09/2017  . Alcohol abuse with alcohol-induced mood disorder ([place]) [F10.14]   . Alcohol withdrawal ([place]) [F10.239] 09/11/2017  . Esophageal varices without bleeding ([place]) [I85.00]   . Hematemesis [K92.0] 09/01/2017  . Ascites due to alcoholic cirrhosis ([place]) [Y19.50]   . Decompensated hepatic cirrhosis ([place]) [K72.90] 07/23/2017  . Alcohol abuse [F10.10] 07/23/2017  . Hepatitis C, chronic ([place]) [B18.2] 07/23/2017  . Hypokalemia [E87.6] 07/23/2017  . Jaundice [R17] 07/23/2017  . Coagulopathy ([place]) [D68.9] 07/23/2017  . Hypomagnesemia [E83.42] 07/23/2017  . Pancytopenia ([place]) [D32.671] 07/23/2017  . Alcoholic cirrhosis of liver with ascites ([place]) [K70.31] 07/23/2017   Total Time spent with patient: 20 minutes  Past Psychiatric History: See H&P  Past Medical History:  Past Medical History:  Diagnosis Date  . Anxiety   . Bipolar affective disorder ([place])    With anxiety features  . Cirrhosis of liver ([place])    Due to alcohol and hepatitis C  . Depression   . GERD (gastroesophageal reflux disease)   . Hepatitis C 2018   hepatitis c and alcohol related hepatitis  . History of blood transfusion    "blood doesn't clot; I fell down and had to have a transfusion"  . History of kidney stones   . Migraine    "when I get really stressed" (09/01/2017)  . Schizophrenia ([place])   . Seizures ([place])    "when I run out of my RX; lots recently" (09/01/2017)    Past Surgical History:  Procedure Laterality Date  .  ESOPHAGOGASTRODUODENOSCOPY N/A 09/03/2017   Procedure: ESOPHAGOGASTRODUODENOSCOPY (EGD);  Surgeon: Doran Stabler, MD;  Location: [place];  Service: Gastroenterology;  Laterality: N/A;  . FINGER FRACTURE SURGERY Left    "shattered my pinky"  . FRACTURE  SURGERY    . IR PARACENTESIS  07/23/2017  . IR PARACENTESIS  07/2017   "did it twice in the same week" (09/01/2017)  . SHOULDER OPEN ROTATOR CUFF REPAIR Right   . TUBAL LIGATION    . VAGINAL HYSTERECTOMY     Family History:  Family History  Problem Relation Age of Onset  . Lung cancer Mother 3  . Alcohol abuse Mother   . Throat cancer Father 49   Family Psychiatric  History: See H&P Social History:  Social History   Substance and Sexual Activity  Alcohol Use Yes  . Alcohol/week: 268.8 oz  . Types: 448 Shots of liquor per week   Comment: 09/01/2017 "maybe 1/2 gallon vodka/day"     Social History   Substance and Sexual Activity  Drug Use No    Social History   Socioeconomic History  . Marital status: Legally Separated    Spouse name: None  . Number of children: None  . Years of education: None  . Highest education level: None  Social Needs  . Financial resource strain: None  . Food insecurity - worry: None  . Food insecurity - inability: None  . Transportation needs - medical: None  . Transportation needs - non-medical: None  Occupational History  . Occupation: applying for disability  Tobacco Use  . Smoking status: Never Smoker  . Smokeless tobacco: Never Used  Substance and Sexual Activity  . Alcohol use: Yes    Alcohol/week: 268.8 oz    Types: 448 Shots of liquor per week    Comment: 09/01/2017 "maybe 1/2 gallon vodka/day"  . Drug use: No  . Sexual activity: Not Currently  Other Topics Concern  . None  Social History Narrative   She moved with a boyfriend to [place] and was followed at [facility].  He died of a massive heart attack in 8/18, per her report, and so she moved back to [place] and is living with a friend.   Additional Social History:    Pain Medications: See MAR Prescriptions: See MAR Over the Counter: See MAR History of alcohol / drug use?: Yes Longest period of sobriety (when/how long): unknown Name of Substance 1:  alcohol 1 - Amount (size/oz): fifth of liquor 1 - Frequency: daily 1 - Duration: "months" 1 - Last Use / Amount: 10/25/17  Sleep: Fair  Appetite:  Fair  Current Medications: Current Facility-Administered Medications  Medication Dose Route Frequency Provider Last Rate Last Dose  . alum & mag hydroxide-simeth (MAALOX/MYLANTA) 200-200-20 MG/5ML suspension 30 mL  30 mL Oral Q4H PRN Okonkwo, Justina A, NP   30 mL at 10/30/17 2204  . busPIRone (BUSPAR) tablet 7.5 mg  7.5 mg Oral BID Urijah Raynor A, MD      . FLUoxetine (PROZAC) capsule 20 mg  20 mg Oral Daily Money, Lowry Ram, FNP   20 mg at 10/31/17 0830  . furosemide (LASIX) tablet 20 mg  20 mg Oral Daily Okonkwo, Justina A, NP   20 mg at 10/31/17 0830  . hydrOXYzine (ATARAX/VISTARIL) tablet 25 mg  25 mg Oral TID PRN Hughie Closs A, NP   25 mg at 10/31/17 0918  . lactulose (CHRONULAC) 10 GM/15ML solution 30 g  30 g Oral TID PRN Ebbie Sorenson, Myer Peer,  MD   30 g at 10/31/17 0829  . loperamide (IMODIUM) capsule 2-4 mg  2-4 mg Oral PRN Lu Duffel, Justina A, NP   2 mg at 10/31/17 1111  . LORazepam (ATIVAN) tablet 1 mg  1 mg Oral Q6H PRN Naysa Puskas, Myer Peer, MD   1 mg at 10/30/17 1810  . magnesium hydroxide (MILK OF MAGNESIA) suspension 30 mL  30 mL Oral Daily PRN Okonkwo, Justina A, NP      . metroNIDAZOLE (FLAGYL) tablet 500 mg  500 mg Oral Q12H Money, Darnelle Maffucci B, FNP   500 mg at 10/31/17 0830  . multivitamin with minerals tablet 1 tablet  1 tablet Oral Daily Aryssa Rosamond, Myer Peer, MD   1 tablet at 10/31/17 0830  . ondansetron (ZOFRAN-ODT) disintegrating tablet 4 mg  4 mg Oral Q6H PRN Okonkwo, Justina A, NP   4 mg at 10/30/17 1810  . pentoxifylline (TRENTAL) CR tablet 400 mg  400 mg Oral TID WC Okonkwo, Justina A, NP   400 mg at 10/31/17 9211  . spironolactone (ALDACTONE) tablet 50 mg  50 mg Oral Daily Letita Prentiss, Myer Peer, MD   50 mg at 10/31/17 0830  . thiamine (VITAMIN B-1) tablet 100 mg  100 mg Oral Daily Camdon Saetern, Myer Peer, MD   100 mg at 10/31/17 0830   . topiramate (TOPAMAX) tablet 50 mg  50 mg Oral Daily Okonkwo, Justina A, NP   50 mg at 10/31/17 0830  . traZODone (DESYREL) tablet 50 mg  50 mg Oral QHS PRN Lu Duffel, Justina A, NP   50 mg at 10/30/17 2204    Lab Results:  Results for orders placed or performed during the hospital encounter of 10/28/17 (from the past 48 hour(s))  CBC with Differential/Platelet     Status: Abnormal   Collection Time: 10/29/17  6:12 PM  Result Value Ref Range   WBC 4.7 4.0 - 10.5 K/uL    Comment: WHITE COUNT CONFIRMED ON SMEAR   RBC 4.33 3.87 - 5.11 MIL/uL   Hemoglobin 12.7 12.0 - 15.0 g/dL   HCT 39.8 36.0 - 46.0 %   MCV 91.9 78.0 - 100.0 fL   MCH 29.3 26.0 - 34.0 pg   MCHC 31.9 30.0 - 36.0 g/dL   RDW 18.1 (H) 11.5 - 15.5 %   Platelets  150 - 400 K/uL    PLATELET CLUMPS NOTED ON SMEAR, COUNT APPEARS DECREASED   Neutrophils Relative % 60 %   Lymphocytes Relative 29 %   Monocytes Relative 10 %   Eosinophils Relative 1 %   Basophils Relative 0 %   Band Neutrophils 0 %   Metamyelocytes Relative 0 %   Myelocytes 0 %   Promyelocytes Absolute 0 %   Blasts 0 %   nRBC 0 0 /100 WBC   Other 0 %   Neutro Abs 2.8 1.7 - 7.7 K/uL   Lymphs Abs 1.4 0.7 - 4.0 K/uL   Monocytes Absolute 0.5 0.1 - 1.0 K/uL   Eosinophils Absolute 0.0 0.0 - 0.7 K/uL   Basophils Absolute 0.0 0.0 - 0.1 K/uL   Smear Review MORPHOLOGY UNREMARKABLE     Comment: Performed at [facility], [place] [address], [place], [place] [address]  Comprehensive metabolic panel     Status: Abnormal   Collection Time: 10/30/17  6:24 AM  Result Value Ref Range   Sodium 135 135 - 145 mmol/L   Potassium 3.9 3.5 - 5.1 mmol/L   Chloride 104 101 - 111 mmol/L   CO2  20 (L) 22 - 32 mmol/L   Glucose, Bld 103 (H) 65 - 99 mg/dL   BUN 5 (L) 6 - 20 mg/dL   Creatinine, Ser 0.72 0.44 - 1.00 mg/dL   Calcium 8.9 8.9 - 10.3 mg/dL   Total Protein 8.0 6.5 - 8.1 g/dL   Albumin 3.1 (L) 3.5 - 5.0 g/dL   AST 36 15 - 41 U/L   ALT 17 14 - 54 U/L    Alkaline Phosphatase 157 (H) 38 - 126 U/L   Total Bilirubin 1.1 0.3 - 1.2 mg/dL   GFR calc non Af Amer >60 >60 mL/min   GFR calc Af Amer >60 >60 mL/min    Comment: (NOTE) The eGFR has been calculated using the CKD EPI equation. This calculation has not been validated in all clinical situations. eGFR's persistently <60 mL/min signify possible Chronic Kidney Disease.    Anion gap 11 5 - 15    Comment: Performed at [facility], [place] [address], [place], [place] [address]  Ammonia     Status: Abnormal   Collection Time: 10/30/17  6:24 AM  Result Value Ref Range   Ammonia 79 (H) 9 - 35 umol/L    Comment: Performed at [facility], [place] [address], [place], [place] [address]    Blood Alcohol level:  Lab Results  Component Value Date   ETH 253 (H) 10/22/2017   ETH <10 17/51/0258    Metabolic Disorder Labs: Lab Results  Component Value Date   HGBA1C 4.9 10/21/2017   No results found for: PROLACTIN No results found for: CHOL, TRIG, HDL, CHOLHDL, VLDL, LDLCALC  Physical Findings: AIMS: Facial and Oral Movements Muscles of Facial Expression: None, normal Lips and Perioral Area: None, normal Jaw: None, normal Tongue: None, normal,Extremity Movements Upper (arms, wrists, hands, fingers): None, normal Lower (legs, knees, ankles, toes): None, normal, Trunk Movements Neck, shoulders, hips: None, normal, Overall Severity Severity of abnormal movements (highest score from questions above): None, normal Incapacitation due to abnormal movements: None, normal Patient's awareness of abnormal movements (rate only patient's report): No Awareness, Dental Status Current problems with teeth and/or dentures?: No Does patient usually wear dentures?: No  CIWA:  CIWA-Ar Total: 3 COWS:     Musculoskeletal: Strength & Muscle Tone: within normal limits no tremors, no diaphoresis, no psychomotor agitation or restlessness  Gait & Station: normal Patient leans:  N/A  Psychiatric Specialty Exam: Physical Exam  Nursing note and vitals reviewed. Constitutional: She is oriented to person, place, and time. She appears well-developed and well-nourished.  Cardiovascular: Normal rate.  Respiratory: Effort normal.  Musculoskeletal: Normal range of motion.  Neurological: She is alert and oriented to person, place, and time.  Skin: Skin is warm.    Review of Systems  Constitutional: Negative.   HENT: Negative.   Eyes: Negative.   Respiratory: Negative.   Cardiovascular: Negative.   Gastrointestinal: Negative.   Genitourinary: Negative.   Musculoskeletal: Negative.   Skin: Negative.   Neurological: Negative.   Endo/Heme/Allergies: Negative.   Psychiatric/Behavioral: Positive for depression and substance abuse. Negative for hallucinations and suicidal ideas. The patient is nervous/anxious.   denies headache, denies visual disturbances, denies chest pain , no vomiting   Blood pressure 104/70, pulse 100, temperature 98.3 F (36.8 C), temperature source Oral, resp. rate 16, height 4' 11.5" (1.511 m), weight 55.8 kg (123 lb).Body mass index is 24.43 kg/m.  General Appearance: improving grooming   Eye Contact:  Fair  Speech:  Normal Rate  Volume:  Normal  Mood:  reports mood is " better", presents vaguely depressed, sullen  Affect:  blunted, but brightens partially during session, does smile at times appropriately   Thought Process:  Linear and Descriptions of Associations: Intact  Orientation:  Other:  fully alert and attentive   Thought Content:  denies hallucinations, no delusions expressed, does not appear internally preoccupied   Suicidal Thoughts:  No denies suicidal ideations, denies self injurious ideations, denies homicidal or violent ideations  Homicidal Thoughts:  No  Memory:  recent and remote grossly intact   Judgement:  Fair  Insight:  Fair  Psychomotor Activity:  Normal- no current psychomotor agitation or tremulousness    Concentration:  Concentration: improved  and Attention Span: improved   Recall:  Good  Fund of Knowledge:  Good  Language:  Good  Akathisia:  No  Handed:  Right  AIMS (if indicated):     Assets:  Communication Skills Desire for Improvement Financial Resources/Insurance Housing Physical Health Social Support Transportation  ADL's:  Intact  Cognition:  WNL  Sleep:  Number of Hours: 4.5   Assessment - patient is presenting with partial improvement - presents alert, attentive, not delirious or confused at this time ( ammonia still high but decreased to 79) , today exhibits a blunted but  partially improving range of affect, denies SI, and is future oriented, focused on being discharged soon. No current significant alcohol WDL symptoms , no current hallucinations (which she reports occur in the context of heavy drinking or WDL only, and are suggestive of alcohol hallucinosis). Patient states she intends to stop drinking, and denies cravings, but does not currently formulate a treatment plan to facilitate her sobriety/abstinence. I have encouraged her to consider a residential/rehab referral but she is not interested at this time. Not interested in Acamprosate trial at present . We have reviewed AA participation as well as the importance of avoiding people, places and situations associated with alcohol abuse .  Treatment Plan Summary: Treatment Plan reviewed as below today 2/22 Daily contact with patient to assess and evaluate symptoms and progress in treatment, Medication management and Plan is to:  -Continue Prozac 20 mg QDAY for depression, anxiety  -Restart Buspar 7.5 mgrs BID for anxiety -Continue Lactulose 30 mg TID PRN for increased ammonia level -Continue Ativan 1 mg PRN CIWA >10 -Continue Topamax 50 mg Daily for headache prophylaxis  -Continue Trazodone 50 mg QHS PRN for insomnia -Continue Vistaril 25 mg  TID PRN for anxiety -Encourage group therapy participation to work on coping  skills and symptom reduction -Continue to encourage efforts to work on sobriety and relapse prevention- see above  Jenne Campus, MD 10/31/2017, 11:45 AM   Patient ID: Briana Sweeney, female   DOB: Apr 08, 1971, 48 y.o.   MRN: [id number]

## 2017-10-31 NOTE — Progress Notes (Signed)
D: Patient denies SI, HI or AVH. Patient presents as flat and sullen, otherwise calm and cooperative.  Pt. Did not attend wrap up group this evening.  Pt. Has been isolative and preoccupied with medication.  Pt. Requests medication but is unsure what it is used for and describes it by color.  Pt. Accepted medication to assist with sleep and complained of indigestion for which she was treated.      A: Patient given emotional support from RN. Patient encouraged to come to staff with concerns and/or questions. Patient's medication routine continued. Patient's orders and plan of care reviewed.   R: Patient remains appropriate and cooperative. Will continue to monitor patient q15 minutes for safety.

## 2017-10-31 NOTE — Progress Notes (Signed)
Recreation Therapy Notes  Date: 10/31/17 Time: 0930 Location: 300 Hall Dayroom  Group Topic: Stress Management  Goal Area(s) Addresses:  Patient will verbalize importance of using healthy stress management.  Patient will identify positive emotions associated with healthy stress management.   Intervention: Stress Management  Activity :  Meditation.  LRT introduced the stress management technique of meditation.  LRT played a meditation from the Calm app the focused on gratitude.  Patients were to listen and follow along with the meditation as it played.  Education:  Stress Management, Discharge Planning.   Education Outcome: Acknowledges edcuation/In group clarification offered/Needs additional education  Clinical Observations/Feedback: Pt did not attend group.    Victorino Sparrow, LRT/CTRS         Victorino Sparrow A 10/31/2017 11:31 AM

## 2017-10-31 NOTE — BHH Group Notes (Signed)
LCSW Group Therapy Note 10/31/2017 2:18 PM  Type of Therapy/Topic: Group Therapy: Balance in Life  Participation Level: Did Not Attend  Description of Group:  This group will address the concept of balance and how it feels and looks when one is unbalanced. Patients will be encouraged to process areas in their lives that are out of balance and identify reasons for remaining unbalanced. Facilitators will guide patients in utilizing problem-solving interventions to address and correct the stressor making their life unbalanced. Understanding and applying boundaries will be explored and addressed for obtaining and maintaining a balanced life. Patients will be encouraged to explore ways to assertively make their unbalanced needs known to significant others in their lives, using other group members and facilitator for support and feedback.  Therapeutic Goals: 1. Patient will identify two or more emotions or situations they have that consume much of in their lives. 2. Patient will identify signs/triggers that life has become out of balance:  3. Patient will identify two ways to set boundaries in order to achieve balance in their lives:  4. Patient will demonstrate ability to communicate their needs through discussion and/or role plays  Summary of Patient Progress:  Invited, chose not to attend  Therapeutic Modalities:  Cognitive Behavioral Therapy Solution-Focused Therapy Assertiveness Training   Theresa Duty Clinical Social Worker

## 2017-10-31 NOTE — Progress Notes (Signed)
Patient denies SI, HI and AVH.  Patient has attended groups and engaged in unit activities this shift.  Patient has had no incidents of behavioral dyscontrol.   Assess patient for safety, offer medications as prescribed, engage patient in 1:1 staff talks.   Patient able to contract for safety. Continue to monitor as planned.

## 2017-11-01 DIAGNOSIS — K219 Gastro-esophageal reflux disease without esophagitis: Secondary | ICD-10-CM

## 2017-11-01 LAB — AMMONIA: Ammonia: 46 umol/L — ABNORMAL HIGH (ref 9–35)

## 2017-11-01 MED ORDER — FAMOTIDINE 20 MG PO TABS
20.0000 mg | ORAL_TABLET | Freq: Two times a day (BID) | ORAL | Status: DC
  Administered 2017-11-01 – 2017-11-02 (×3): 20 mg via ORAL
  Filled 2017-11-01 (×7): qty 1

## 2017-11-01 MED ORDER — ONDANSETRON 4 MG PO TBDP
4.0000 mg | ORAL_TABLET | Freq: Three times a day (TID) | ORAL | Status: DC | PRN
  Administered 2017-11-01 – 2017-11-02 (×2): 4 mg via ORAL
  Filled 2017-11-01 (×2): qty 1

## 2017-11-01 MED ORDER — TRAZODONE HCL 100 MG PO TABS
100.0000 mg | ORAL_TABLET | Freq: Every evening | ORAL | Status: DC | PRN
  Administered 2017-11-01: 100 mg via ORAL
  Filled 2017-11-01: qty 1
  Filled 2017-11-01: qty 7

## 2017-11-01 NOTE — Progress Notes (Addendum)
D: Patient continues to exhibit med seeking behavior.  She was hoping to discharge today.  Patient asked for anti-anxiety medication and she was given a vistaril.  Patient stated, "I usually get the green capsules."  "Why am I getting a white pill."  Explained to patient that the librium is not given unless a CIWA is over 10, which she did not meet.  Her detox protocol has been completed.  She is complaining of nausea stating, "I'm gonna throw up all those pills I took."  Patient is intrusive with staff and can be demanding at times.  She will discharge tomorrow after another ammonia draw has been completed. Patient's goal today is "do not drink and think positive; go home clean and stay sober."  She denies any thoughts of self harm.  She rates her depression as a 4; anxiety as a 2.  She is sleeping and eating well; her energy level is normal and her concentration is good.  A: Continue to monitor medication management and MD orders.  Safety checks continued every 15 minutes per protocol.  Offer support and encouragement as needed.  R: Patient is attention seeking with staff.

## 2017-11-01 NOTE — B[facility] Group Notes (Signed)
LCSW Group Therapy Note  11/01/2017 9:30-10:30AM - 300 Hall, 10:30-11:30 - 400 Hall, 11:30-12:00 - 500 Hall  Type of Therapy and Topic:  Group Therapy: Anger Cues and Responses  Participation Level:  Minimal   Description of Group:   In this group, patients learned how to recognize the physical, cognitive, emotional, and behavioral responses they have to anger-provoking situations.  They identified a recent time they became angry and how they reacted.  They analyzed how their reaction was possibly beneficial and how it was possibly unhelpful.  The group discussed a variety of healthier coping skills that could help with such a situation in the future.  Deep breathing was practiced briefly.  Therapeutic Goals: 1. Patients will remember their last incident of anger and how they felt emotionally and physically, what their thoughts were at the time, and how they behaved. 2. Patients will identify how their behavior at that time worked for them, as well as how it worked against them. 3. Patients will explore possible new behaviors to use in future anger situations. 4. Patients will learn that anger itself is normal and cannot be eliminated, and that healthier reactions can assist with resolving conflict rather than worsening situations.  Summary of Patient Progress:  The patient shared that their most recent time of anger was yesterday and this was when her daughter kept hanging up on her then calling back, trying to boss her around.  She was involved in the first part of group, then started leaving due to stomach issues, coming back and leaving again..  Therapeutic Modalities:   Cognitive Behavioral Therapy  Maretta Los  11/01/2017 8:25 AM

## 2017-11-01 NOTE — Progress Notes (Signed)
D   Pt reports depression and anxiety have improved    She has been visible on the milieu and has appropriate behavior    Pt focused on medications and ask for whatever she can have   Reports she is ready for discharge   A    Verbal support given   Medications administered and effectiveness monitored    Q 15 min checks   Informed pt diarrhea was to be expected since she is taking lactulose   Informed pt that her amonia levels were going down R   Pt is safe and verbalized understanding and is presently safe

## 2017-11-01 NOTE — Plan of Care (Signed)
Adequate for Discharge Activity: Interest or engagement in activities will improve 11/01/2017 0914 - Adequate for Discharge by Joice Lofts, RN Sleeping patterns will improve 11/01/2017 0914 - Adequate for Discharge by Joice Lofts, RN Education: Knowledge of [place] Education information/materials will improve 11/01/2017 0914 - Adequate for Discharge by Joice Lofts, RN Emotional status will improve 11/01/2017 0914 - Adequate for Discharge by Joice Lofts, RN Mental status will improve 11/01/2017 0914 - Adequate for Discharge by Joice Lofts, RN Verbalization of understanding the information provided will improve 11/01/2017 0914 - Adequate for Discharge by Joice Lofts, RN Coping: Ability to verbalize frustrations and anger appropriately will improve 11/01/2017 0914 - Adequate for Discharge by Joice Lofts, RN Ability to demonstrate self-control will improve 11/01/2017 0914 - Adequate for Discharge by Joice Lofts, RN Health Behavior/Discharge Planning: Identification of resources available to assist in meeting health care needs will improve 11/01/2017 0914 - Adequate for Discharge by Joice Lofts, RN Compliance with treatment plan for underlying cause of condition will improve 11/01/2017 0914 - Adequate for Discharge by Joice Lofts, RN Physical Regulation: Ability to maintain clinical measurements within normal limits will improve 11/01/2017 0914 - Adequate for Discharge by Joice Lofts, RN Safety: Periods of time without injury will increase 11/01/2017 0914 - Adequate for Discharge by Joice Lofts, RN Education: Ability to make informed decisions regarding treatment will improve 11/01/2017 0914 - Adequate for Discharge by Joice Lofts, RN Coping: Ability to cope will improve 11/01/2017 0914 - Adequate for Discharge by Joice Lofts, RN Health Behavior/Discharge  Planning: Identification of resources available to assist in meeting health care needs will improve 11/01/2017 0914 - Adequate for Discharge by Joice Lofts, RN Medication: Compliance with prescribed medication regimen will improve 11/01/2017 0914 - Adequate for Discharge by Joice Lofts, RN Self-Concept: Ability to disclose and discuss suicidal ideas will improve 11/01/2017 0914 - Adequate for Discharge by Joice Lofts, RN Ability to verbalize positive feelings about self will improve 11/01/2017 0914 - Adequate for Discharge by Joice Lofts, RN Education: Knowledge of disease or condition will improve 11/01/2017 0914 - Adequate for Discharge by Joice Lofts, RN Understanding of discharge needs will improve 11/01/2017 0914 - Adequate for Discharge by Joice Lofts, [place] Behavior/Discharge Planning: Ability to identify changes in lifestyle to reduce recurrence of condition will improve 11/01/2017 0914 - Adequate for Discharge by Joice Lofts, RN Identification of resources available to assist in meeting health care needs will improve 11/01/2017 0914 - Adequate for Discharge by Joice Lofts, RN Physical Regulation: Complications related to the disease process, condition or treatment will be avoided or minimized 11/01/2017 0914 - Adequate for Discharge by Joice Lofts, RN Safety: Ability to remain free from injury will improve 11/01/2017 0914 - Adequate for Discharge by Joice Lofts, RN Activity: Interest or engagement in leisure activities will improve 11/01/2017 0914 - Adequate for Discharge by Joice Lofts, RN Imbalance in normal sleep/wake cycle will improve 11/01/2017 0914 - Adequate for Discharge by Joice Lofts, RN Education: Utilization of techniques to improve thought processes will improve 11/01/2017 0914 - Adequate for Discharge by Joice Lofts, RN Knowledge of the prescribed therapeutic  regimen will improve 11/01/2017 0914 - Adequate for Discharge by Joice Lofts, RN Coping: Ability to cope will improve 11/01/2017 0914 - Adequate for Discharge by Joice Lofts, RN Ability to verbalize feelings will improve 11/01/2017 0914 -  Adequate for Discharge by Joice Lofts, RN Health Behavior/Discharge Planning: Ability to make decisions will improve 11/01/2017 0914 - Adequate for Discharge by Joice Lofts, RN Compliance with therapeutic regimen will improve 11/01/2017 0914 - Adequate for Discharge by Joice Lofts, RN Role Relationship: Ability to demonstrate positive changes in social behaviors and relationships will improve 11/01/2017 0914 - Adequate for Discharge by Joice Lofts, RN Safety: Ability to disclose and discuss suicidal ideas will improve 11/01/2017 0914 - Adequate for Discharge by Joice Lofts, RN Ability to identify and utilize support systems that promote safety will improve 11/01/2017 0914 - Adequate for Discharge by Joice Lofts, RN Self-Concept: Ability to verbalize positive feelings about self will improve 11/01/2017 0914 - Adequate for Discharge by Joice Lofts, RN Level of anxiety will decrease 11/01/2017 0914 - Adequate for Discharge by Joice Lofts, RN Spiritual Needs Ability to function at adequate level 11/01/2017 0914 - Adequate for Discharge by Joice Lofts, RN

## 2017-11-01 NOTE — Progress Notes (Signed)
[facility] MD Progress Note  11/01/2017 11:51 AM Briana Sweeney  MRN:  [id number]   Subjective:  Patient states that she is feeling better today, "I'm good today, I mean I'm not the happiest woman in the word but I'm good." She reports that her sleep last night was disrupted. She has a fair appetite, but has felt a little nauseous from time to time. She agrees to continue her medication and wants to know her Ammonia level before she discharges.  Objective:  Patient's chart and findings reviewed and discussed with treatment team. Patient is pleasant and cooperative. She has been in the day room and has been attending groups. She has been compliant with treatments. Will increase her Trazodone to 100 mg QHS PRN and started Pepcid 20 mg BID for acid reflux that she has reported to cause her nausea. Will also order an Ammonia level and she will discharge tomorrow.    Principal Problem: MDD (major depressive disorder), severe ([place]) Diagnosis:   Patient Active Problem List   Diagnosis Date Noted  . GERD (gastroesophageal reflux disease) [K21.9] 11/01/2017  . Trichimoniasis [A59.9] 10/30/2017  . Alcohol dependence ([place]) [F10.20] 10/29/2017  . MDD (major depressive disorder), severe ([place]) [F32.2] 10/28/2017  . Acute hyperactive alcohol withdrawal delirium ([place]) [F10.231] 10/09/2017  . Schizophrenia ([place]) [F20.9] 10/09/2017  . Alcohol abuse with alcohol-induced mood disorder ([place]) [F10.14]   . Alcohol withdrawal ([place]) [F10.239] 09/11/2017  . Esophageal varices without bleeding ([place]) [I85.00]   . Hematemesis [K92.0] 09/01/2017  . Ascites due to alcoholic cirrhosis ([place]) [F62.13]   . Decompensated hepatic cirrhosis ([place]) [K72.90] 07/23/2017  . Alcohol abuse [F10.10] 07/23/2017  . Hepatitis C, chronic ([place]) [B18.2] 07/23/2017  . Hypokalemia [E87.6] 07/23/2017  . Jaundice [R17] 07/23/2017  . Coagulopathy ([place]) [D68.9] 07/23/2017  . Hypomagnesemia [E83.42] 07/23/2017  . Pancytopenia ([place]) [Y86.578] 07/23/2017   . Alcoholic cirrhosis of liver with ascites ([place]) [K70.31] 07/23/2017   Total Time spent with patient: 30 minutes  Past Psychiatric History: See H&P  Past Medical History:  Past Medical History:  Diagnosis Date  . Anxiety   . Bipolar affective disorder ([place])    With anxiety features  . Cirrhosis of liver ([place])    Due to alcohol and hepatitis C  . Depression   . GERD (gastroesophageal reflux disease)   . Hepatitis C 2018   hepatitis c and alcohol related hepatitis  . History of blood transfusion    "blood doesn't clot; I fell down and had to have a transfusion"  . History of kidney stones   . Migraine    "when I get really stressed" (09/01/2017)  . Schizophrenia ([place])   . Seizures ([place])    "when I run out of my RX; lots recently" (09/01/2017)    Past Surgical History:  Procedure Laterality Date  . ESOPHAGOGASTRODUODENOSCOPY N/A 09/03/2017   Procedure: ESOPHAGOGASTRODUODENOSCOPY (EGD);  Surgeon: Doran Stabler, MD;  Location: [facility];  Service: Gastroenterology;  Laterality: N/A;  . FINGER FRACTURE SURGERY Left    "shattered my pinky"  . FRACTURE SURGERY    . IR PARACENTESIS  07/23/2017  . IR PARACENTESIS  07/2017   "did it twice in the same week" (09/01/2017)  . SHOULDER OPEN ROTATOR CUFF REPAIR Right   . TUBAL LIGATION    . VAGINAL HYSTERECTOMY     Family History:  Family History  Problem Relation Age of Onset  . Lung cancer Mother 15  . Alcohol abuse Mother   . Throat cancer Father 23  Family Psychiatric  History: See H&P Social History:  Social History   Substance and Sexual Activity  Alcohol Use Yes  . Alcohol/week: 268.8 oz  . Types: 448 Shots of liquor per week   Comment: 09/01/2017 "maybe 1/2 gallon vodka/day"     Social History   Substance and Sexual Activity  Drug Use No    Social History   Socioeconomic History  . Marital status: Legally Separated    Spouse name: None  . Number of children: None  . Years of education: None  .  Highest education level: None  Social Needs  . Financial resource strain: None  . Food insecurity - worry: None  . Food insecurity - inability: None  . Transportation needs - medical: None  . Transportation needs - non-medical: None  Occupational History  . Occupation: applying for disability  Tobacco Use  . Smoking status: Never Smoker  . Smokeless tobacco: Never Used  Substance and Sexual Activity  . Alcohol use: Yes    Alcohol/week: 268.8 oz    Types: 448 Shots of liquor per week    Comment: 09/01/2017 "maybe 1/2 gallon vodka/day"  . Drug use: No  . Sexual activity: Not Currently  Other Topics Concern  . None  Social History Narrative   She moved with a boyfriend to [place] and was followed at [facility].  He died of a massive heart attack in 8/18, per her report, and so she moved back to [place] and is living with a friend.   Additional Social History:    Pain Medications: See MAR Prescriptions: See MAR Over the Counter: See MAR History of alcohol / drug use?: Yes Longest period of sobriety (when/how long): unknown Name of Substance 1: alcohol 1 - Amount (size/oz): fifth of liquor 1 - Frequency: daily 1 - Duration: "months" 1 - Last Use / Amount: 10/25/17  Sleep: Fair  Appetite:  Fair  Current Medications: Current Facility-Administered Medications  Medication Dose Route Frequency Provider Last Rate Last Dose  . alum & mag hydroxide-simeth (MAALOX/MYLANTA) 200-200-20 MG/5ML suspension 30 mL  30 mL Oral Q4H PRN Okonkwo, Justina A, NP   30 mL at 10/30/17 2204  . busPIRone (BUSPAR) tablet 7.5 mg  7.5 mg Oral BID Cobos, Myer Peer, MD   7.5 mg at 11/01/17 0806  . famotidine (PEPCID) tablet 20 mg  20 mg Oral BID Navraj Dreibelbis, Lowry Ram, FNP      . FLUoxetine (PROZAC) capsule 20 mg  20 mg Oral Daily Divante Kotch, Lowry Ram, FNP   20 mg at 11/01/17 0805  . furosemide (LASIX) tablet 20 mg  20 mg Oral Daily Okonkwo, Justina A, NP   20 mg at 11/01/17 0805  . hydrOXYzine  (ATARAX/VISTARIL) tablet 25 mg  25 mg Oral TID PRN Lu Duffel, Justina A, NP   25 mg at 11/01/17 0809  . lactulose (CHRONULAC) 10 GM/15ML solution 30 g  30 g Oral TID PRN Cobos, Myer Peer, MD   30 g at 10/31/17 0829  . magnesium hydroxide (MILK OF MAGNESIA) suspension 30 mL  30 mL Oral Daily PRN Okonkwo, Justina A, NP      . metroNIDAZOLE (FLAGYL) tablet 500 mg  500 mg Oral Q12H Strider Vallance, Lowry Ram, FNP   500 mg at 11/01/17 0805  . multivitamin with minerals tablet 1 tablet  1 tablet Oral Daily Cobos, Myer Peer, MD   1 tablet at 11/01/17 0805  . ondansetron (ZOFRAN-ODT) disintegrating tablet 4 mg  4 mg Oral Q8H PRN Aracelys Glade, Lowry Ram,  FNP   4 mg at 11/01/17 1032  . pentoxifylline (TRENTAL) CR tablet 400 mg  400 mg Oral TID WC Okonkwo, Justina A, NP   400 mg at 11/01/17 1062  . spironolactone (ALDACTONE) tablet 50 mg  50 mg Oral Daily Cobos, Myer Peer, MD   50 mg at 11/01/17 0805  . thiamine (VITAMIN B-1) tablet 100 mg  100 mg Oral Daily Cobos, Myer Peer, MD   100 mg at 11/01/17 0806  . topiramate (TOPAMAX) tablet 50 mg  50 mg Oral Daily Okonkwo, Justina A, NP   50 mg at 11/01/17 0805  . traZODone (DESYREL) tablet 100 mg  100 mg Oral QHS PRN Rejeana Fadness, Lowry Ram, FNP        Lab Results:  No results found for this or any previous visit (from the past 48 hour(s)).  Blood Alcohol level:  Lab Results  Component Value Date   ETH 253 (H) 10/22/2017   ETH <10 69/48/5462    Metabolic Disorder Labs: Lab Results  Component Value Date   HGBA1C 4.9 10/21/2017   No results found for: PROLACTIN No results found for: CHOL, TRIG, HDL, CHOLHDL, VLDL, LDLCALC  Physical Findings: AIMS: Facial and Oral Movements Muscles of Facial Expression: None, normal Lips and Perioral Area: None, normal Jaw: None, normal Tongue: None, normal,Extremity Movements Upper (arms, wrists, hands, fingers): None, normal Lower (legs, knees, ankles, toes): None, normal, Trunk Movements Neck, shoulders, hips: None, normal, Overall  Severity Severity of abnormal movements (highest score from questions above): None, normal Incapacitation due to abnormal movements: None, normal Patient's awareness of abnormal movements (rate only patient's report): No Awareness, Dental Status Current problems with teeth and/or dentures?: No Does patient usually wear dentures?: No  CIWA:  CIWA-Ar Total: 3 COWS:     Musculoskeletal: Strength & Muscle Tone: within normal limits   Gait & Station: normal Patient leans: N/A  Psychiatric Specialty Exam: Physical Exam  Nursing note and vitals reviewed. Constitutional: She is oriented to person, place, and time. She appears well-developed and well-nourished.  Respiratory: Effort normal.  Musculoskeletal: Normal range of motion.  Neurological: She is alert and oriented to person, place, and time.  Skin: Skin is warm.    Review of Systems  Constitutional: Negative.   HENT: Negative.   Eyes: Negative.   Respiratory: Negative.   Cardiovascular: Negative.   Gastrointestinal: Negative.   Genitourinary: Negative.   Musculoskeletal: Negative.   Skin: Negative.   Neurological: Negative.   Endo/Heme/Allergies: Negative.   Psychiatric/Behavioral: Positive for depression and substance abuse. Negative for hallucinations and suicidal ideas. The patient is nervous/anxious.     Blood pressure 114/79, pulse (!) 103, temperature 98.2 F (36.8 C), temperature source Oral, resp. rate 16, height 4' 11.5" (1.511 m), weight 55.8 kg (123 lb).Body mass index is 24.43 kg/m.  General Appearance: improving grooming   Eye Contact:  Fair  Speech:  Normal Rate  Volume:  Normal  Mood:  Euthymic  Affect:  Flat  Thought Process:  Goal Directed and Descriptions of Associations: Intact  Orientation:  Full (Time, Place, and Person)  Thought Content:  WDL  Suicidal Thoughts:  No   Homicidal Thoughts:  No  Memory:  recent and remote grossly intact   Judgement:  Fair  Insight:  Fair    Concentration:   Concentration: improved  and Attention Span: improved   Recall:  Good  Fund of Knowledge:  Good  Language:  Good  Akathisia:  No  Handed:  Right  AIMS (if indicated):  Assets:  Communication Skills Desire for Improvement Financial Resources/Insurance Housing Physical Health Social Support Transportation  ADL's:  Intact  Cognition:  WNL  Sleep:  Number of Hours: 6   Problems Addressed: Schizophrenia MDD severe GERD .  Treatment Plan Summary: Daily contact with patient to assess and evaluate symptoms and progress in treatment, Medication management and Plan is to:  -Continue Prozac 20 mg QDAY for depression, anxiety  -Continue Buspar 7.5 mgrs BID for anxiety -Continue Lactulose 30 mg TID PRN for increased ammonia level -Ordered Ammonia level for [facility]HH evening draw -Start Pepcid 20 mg PO BID for GERD -Stop Ativan 1 mg PRN CIWA >10 -Continue Topamax 50 mg Daily for headache prophylaxis  -Increase Trazodone 100 mg QHS PRN for insomnia -Continue Vistaril 25 mg TID PRN for anxiety -Encourage group therapy participation    Lewis Shock, FNP 11/01/2017, 11:51 AM

## 2017-11-01 NOTE — Progress Notes (Signed)
D   Pt reports depression and anxiety    She has been visible on the milieu and has appropriate behavior    Pt focused on medications and ask for whatever she can have   Demonstrates poor concentration and some mild thought blocking   Also reported diarrhea  A    Verbal support given   Medications administered and effectiveness monitored    Q 15 min checks   Informed pt diarrhea was to be expected since she is taking lactulose  R   Pt is safe and verbalized understanding

## 2017-11-02 MED ORDER — TRAZODONE HCL 100 MG PO TABS: 100.0000 mg | ORAL_TABLET | Freq: Every evening | ORAL | 0 refills | Status: DC | PRN

## 2017-11-02 MED ORDER — METRONIDAZOLE 500 MG PO TABS: 500.0000 mg | ORAL_TABLET | Freq: Two times a day (BID) | ORAL | 0 refills | Status: DC

## 2017-11-02 MED ORDER — HYDROXYZINE HCL 25 MG PO TABS: 25.0000 mg | ORAL_TABLET | Freq: Three times a day (TID) | ORAL | 0 refills | Status: DC | PRN

## 2017-11-02 MED ORDER — FLUOXETINE HCL 20 MG PO CAPS: 20.0000 mg | ORAL_CAPSULE | Freq: Every day | ORAL | 0 refills | Status: DC

## 2017-11-02 MED ORDER — BUSPIRONE HCL 7.5 MG PO TABS: 7.5000 mg | ORAL_TABLET | Freq: Two times a day (BID) | ORAL | 0 refills | Status: DC

## 2017-11-02 MED ORDER — PENTOXIFYLLINE ER 400 MG PO TBCR: 400.0000 mg | EXTENDED_RELEASE_TABLET | Freq: Three times a day (TID) | ORAL | 0 refills | Status: DC

## 2017-11-02 MED ORDER — FAMOTIDINE 20 MG PO TABS: 20.0000 mg | ORAL_TABLET | Freq: Two times a day (BID) | ORAL | 0 refills | Status: DC

## 2017-11-02 NOTE — Progress Notes (Signed)
  [facility] Adult Case Management Discharge Plan :  Will you be returning to the same living situation after discharge:  Yes,  home At discharge, do you have transportation home?: Yes,  bus pass and PART bus pass given Do you have the ability to pay for your medications: No.  Limited income  Release of information consent forms completed and turned in to Medical Records by CSW.  Patient to Follow up at: Follow-up [facility]. Call on 11/18/2017.   Specialty:  Internal Medicine Why:  Follow up is scheduled for March 12th, 2019 at 1:30pm with Molli Barrows.  Contact information: [place] [phone]          Next level of care provider has access to [place] and Suicide Prevention discussed: No.  Patient declined  Have you used any form of tobacco in the last 30 days? (Cigarettes, Smokeless Tobacco, Cigars, and/or Pipes): No  Has patient been referred to the Quitline?: N/A patient is not a smoker  Patient has been referred for addiction treatment: Pt. refused referral  Maretta Los, LCSW 11/02/2017, 8:53 AM

## 2017-11-02 NOTE — Progress Notes (Signed)
Pt discharged home with a friend. Pt was ambulatory, stable and appreciative at that time. All papers, medication sample, and prescriptions were given and valuables returned. Verbal understanding expressed. Denies SI/HI and A/VH. Pt given opportunity to express concerns and ask questions.

## 2017-11-02 NOTE — [facility] Suicide Risk Assessment (Addendum)
[facility] Discharge Suicide Risk Assessment   Principal Problem: MDD (major depressive disorder), severe [facility]) Discharge Diagnoses:  Patient Active Problem List   Diagnosis Date Noted  . GERD (gastroesophageal reflux disease) [K21.9] 11/01/2017  . Trichimoniasis [A59.9] 10/30/2017  . Alcohol dependence ([place]) [F10.20] 10/29/2017  . MDD (major depressive disorder), severe ([place]) [F32.2] 10/28/2017  . Acute hyperactive alcohol withdrawal delirium ([place]) [F10.231] 10/09/2017  . Schizophrenia ([place]) [F20.9] 10/09/2017  . Alcohol abuse with alcohol-induced mood disorder ([place]) [F10.14]   . Alcohol withdrawal ([place]) [F10.239] 09/11/2017  . Esophageal varices without bleeding ([place]) [I85.00]   . Hematemesis [K92.0] 09/01/2017  . Ascites due to alcoholic cirrhosis ([place]) [N05.39]   . Decompensated hepatic cirrhosis ([place]) [K72.90] 07/23/2017  . Alcohol abuse [F10.10] 07/23/2017  . Hepatitis C, chronic ([place]) [B18.2] 07/23/2017  . Hypokalemia [E87.6] 07/23/2017  . Jaundice [R17] 07/23/2017  . Coagulopathy ([place]) [D68.9] 07/23/2017  . Hypomagnesemia [E83.42] 07/23/2017  . Pancytopenia ([place]) [J67.341] 07/23/2017  . Alcoholic cirrhosis of liver with ascites ([place]) [K70.31] 07/23/2017    Total Time spent with patient: 30 minutes  Musculoskeletal: Strength & Muscle Tone: within normal limits no tremors, no diaphoresis, no psychomotor restlessness  Gait & Station: normal Patient leans: N/A  Psychiatric Specialty Exam: ROS no headache, no chest pain, no shortness of breath, no vomiting, no rash  Blood pressure (!) 89/54, pulse 96, temperature 98.5 F (36.9 C), resp. rate 16, height 4' 11.5" (1.511 m), weight 55.8 kg (123 lb).Body mass index is 24.43 kg/m.  General Appearance: improved grooming   Eye Contact::  Good  Speech:  Normal Rate409  Volume:  Normal  Mood:  " my mood is a lot better", denies feeling depressed   Affect:  more reactive, smiles at times appropriately   Thought Process:  Linear and  Descriptions of Associations: Intact  Orientation:  Other:  fully alert and attentive, she is oriented x 3. No current confusion or delirium   Thought Content:  no hallucinations, no delusions, not internally preoccupied   Suicidal Thoughts:  No denies suicidal ideations, denies self injurious ideations, future oriented , no homicidal or violent ideations  Homicidal Thoughts:  No  Memory:  recent and remote grossly intact  3/3 immediate, 3/3 at 5 minutes   Judgement:  Fair - improving   Insight:  Fair- improving   Psychomotor Activity:  Normal- no tremors, no diaphoresis, no psychomotor restlessness   Concentration:  Good  Recall:  Good  Fund of Knowledge:Good  Language: Good  Akathisia:  Negative  Handed:  Right  AIMS (if indicated):     Assets:  Communication Skills Desire for Improvement Resilience  Sleep:  Number of Hours: 6  Cognition: WNL  ADL's:  Intact   Mental Status Per Nursing Assessment::   On Admission:  Suicidal ideation indicated by patient  Demographic Factors:  47 year old divorced female, has two adult children, lives with a friend .  Loss Factors: BF passed away last year .   Historical Factors: Alcohol use disorder, history of cirrhosis, reports hallucinations in the past which she states have been associated only  with heavy drinking or withdrawal syndrome . History of depression.   Risk Reduction Factors:   Resilience   Continued Clinical Symptoms:  At this time patient is alert, attentive, oriented x 3, presents with improved mood, denies feeling depressed, affect is appropriate, reactive , with fuller range of affect. No thought disorder, no suicidal or self injurious ideations, no homicidal or violent ideations. No current WDL symptoms.  Denies medication side effects. Behavior on unit calm , no disruptive or agitated behaviors, pleasant on approach.  Ammonia improved to 46   Cognitive Features That Contribute To Risk:  No gross cognitive deficits  noted upon discharge. Is alert , attentive, and oriented x 3   Suicide Risk:  Mild:  Suicidal ideation of limited frequency, intensity, duration, and specificity.  There are no identifiable plans, no associated intent, mild dysphoria and related symptoms, good self-control (both objective and subjective assessment), few other risk factors, and identifiable protective factors, including available and accessible social support.  Follow-up [facility]. Call on 11/18/2017.   Specialty:  Internal Medicine Why:  Follow up is scheduled for March 12th, 2019 at 1:30pm with Molli Barrows.  Contact information: [place] [phone]          Plan Of Care/Follow-up recommendations:  Activity:  as tolerated Diet:  regular diet  Tests:  NA Other:  See below  Patient is expressing readiness for discharge, no current grounds for involuntary commitment Plans to return home Patient has an established PCP at [facility] ( Dr. Kenton Kingfisher) , for management of medical issues/cirrhosis. We discussed importance of alcohol abstinence- patient states she is motivated in sobriety, and plans to go to AA regularly.    Jenne Campus, MD 11/02/2017, 10:29 AM

## 2017-11-02 NOTE — B[facility] Group Notes (Signed)
[facility] LCSW Group Therapy Note  Date/Time:  11/02/2017  11:00AM-12:00PM  Type of Therapy and Topic:  Group Therapy:  Music and Mood  Participation Level:  Active   Description of Group: In this process group, members listened to a variety of genres of music and identified that different types of music evoke different responses.  Patients were encouraged to identify music that was soothing for them and music that was energizing for them.  Patients discussed how this knowledge can help with wellness and recovery in various ways including managing depression and anxiety as well as encouraging healthy sleep habits.    Therapeutic Goals: 1. Patients will explore the impact of different varieties of music on mood 2. Patients will verbalize the thoughts they have when listening to different types of music 3. Patients will identify music that is soothing to them as well as music that is energizing to them 4. Patients will discuss how to use this knowledge to assist in maintaining wellness and recovery 5. Patients will explore the use of music as a coping skill  Summary of Patient Progress:  At the beginning of group, patient expressed she felt "confused" and at the end of group said music takes her away from her problems, so she felt better.  Therapeutic Modalities: Solution Focused Brief Therapy Activity   Selmer Dominion, LCSW

## 2017-11-02 NOTE — Discharge Summary (Addendum)
Physician Discharge Summary Note  Patient:  Briana Sweeney is an 47 y.o., female MRN:  [id number] DOB:  09-May-1971 Patient phone:  [phone] (home)  Patient address:   [address] [place] [address],  Total Time spent with patient: 20 minutes  Date of Admission:  10/28/2017 Date of Discharge: 11/03/17  Reason for Admission:  Worsening depression with SI  Principal Problem: MDD (major depressive disorder), severe [facility]) Discharge Diagnoses: Patient Active Problem List   Diagnosis Date Noted  . GERD (gastroesophageal reflux disease) [K21.9] 11/01/2017  . Trichimoniasis [A59.9] 10/30/2017  . Alcohol dependence ([place]) [F10.20] 10/29/2017  . MDD (major depressive disorder), severe ([place]) [F32.2] 10/28/2017  . Acute hyperactive alcohol withdrawal delirium ([place]) [F10.231] 10/09/2017  . Schizophrenia ([place]) [F20.9] 10/09/2017  . Alcohol abuse with alcohol-induced mood disorder ([place]) [F10.14]   . Alcohol withdrawal ([place]) [F10.239] 09/11/2017  . Esophageal varices without bleeding ([place]) [I85.00]   . Hematemesis [K92.0] 09/01/2017  . Ascites due to alcoholic cirrhosis ([place]) [Y09.98]   . Decompensated hepatic cirrhosis ([place]) [K72.90] 07/23/2017  . Alcohol abuse [F10.10] 07/23/2017  . Hepatitis C, chronic ([place]) [B18.2] 07/23/2017  . Hypokalemia [E87.6] 07/23/2017  . Jaundice [R17] 07/23/2017  . Coagulopathy ([place]) [D68.9] 07/23/2017  . Hypomagnesemia [E83.42] 07/23/2017  . Pancytopenia ([place]) [P38.250] 07/23/2017  . Alcoholic cirrhosis of liver with ascites ([place]) [K70.31] 07/23/2017    Past Psychiatric History: Alcohol abuse since 47 years old, hospital admission as a teenager for suicide attempt, has been trialed on Zoloft, Lithium, Topamax (for seizure), and Klonopin. Hx of Cocaine abuse 6 months ago  Past Medical History:  Past Medical History:  Diagnosis Date  . Anxiety   . Bipolar affective disorder ([place])    With anxiety features  . Cirrhosis of liver ([place])    Due to alcohol and  hepatitis C  . Depression   . GERD (gastroesophageal reflux disease)   . Hepatitis C 2018   hepatitis c and alcohol related hepatitis  . History of blood transfusion    "blood doesn't clot; I fell down and had to have a transfusion"  . History of kidney stones   . Migraine    "when I get really stressed" (09/01/2017)  . Schizophrenia ([place])   . Seizures ([place])    "when I run out of my RX; lots recently" (09/01/2017)    Past Surgical History:  Procedure Laterality Date  . ESOPHAGOGASTRODUODENOSCOPY N/A 09/03/2017   Procedure: ESOPHAGOGASTRODUODENOSCOPY (EGD);  Surgeon: Doran Stabler, MD;  Location: [facility];  Service: Gastroenterology;  Laterality: N/A;  . FINGER FRACTURE SURGERY Left    "shattered my pinky"  . FRACTURE SURGERY    . IR PARACENTESIS  07/23/2017  . IR PARACENTESIS  07/2017   "did it twice in the same week" (09/01/2017)  . SHOULDER OPEN ROTATOR CUFF REPAIR Right   . TUBAL LIGATION    . VAGINAL HYSTERECTOMY     Family History:  Family History  Problem Relation Age of Onset  . Lung cancer Mother 58  . Alcohol abuse Mother   . Throat cancer Father 41   Family Psychiatric  History: Mom- Schizophrenia, Daughter - Bipolar and schizophrenia  Social History:  Social History   Substance and Sexual Activity  Alcohol Use Yes  . Alcohol/week: 268.8 oz  . Types: 448 Shots of liquor per week   Comment: 09/01/2017 "maybe 1/2 gallon vodka/day"     Social History   Substance and Sexual Activity  Drug Use No    Social History  Socioeconomic History  . Marital status: Legally Separated    Spouse name: None  . Number of children: None  . Years of education: None  . Highest education level: None  Social Needs  . Financial resource strain: None  . Food insecurity - worry: None  . Food insecurity - inability: None  . Transportation needs - medical: None  . Transportation needs - non-medical: None  Occupational History  . Occupation: applying for  disability  Tobacco Use  . Smoking status: Never Smoker  . Smokeless tobacco: Never Used  Substance and Sexual Activity  . Alcohol use: Yes    Alcohol/week: 268.8 oz    Types: 448 Shots of liquor per week    Comment: 09/01/2017 "maybe 1/2 gallon vodka/day"  . Drug use: No  . Sexual activity: Not Currently  Other Topics Concern  . None  Social History Narrative   She moved with a boyfriend to [place] and was followed at [facility].  He died of a massive heart attack in 8/18, per her report, and so she moved back to [place] and is living with a friend.    Hospital Course:   10/28/17 [facility] RN Assessment: 47 year old female being admitted voluntarily to [facility] from [facility] ED. She came to the ED for suicidal ideation and substance abuse. During [facility] admission, she admitted to drinking at least 8 20 oz beers per day, stopped taking her Zoloft because it wasn't helping and ongoing depression since the death of her boyfriend 06-01-2017. She feels like she doesn't have any supports. She continues to report suicidal ideation and will contract for safety on the unit. She denies HI or A/V hallucinations at this time. She does report hearing voices or seeing thing on occasion. She was very bizarre during admission, affect flat/dull. She seemed confused and would ask the same questions over and over again. Vital signs were stable. She did complain of feeling like she is going through withdrawal.   10/29/17 [facility] MD Assessment: Patient presents to [facility] from [facility] where she was since 10/25/17. She reports that she came to the hospital because she is ready to sop drinking. She reports that she drinks approximately 1/2 gallon of liquor a day with beer. She reports that she has a room she rents and that she wants to go through detox and then go to outpatient services. At this time she is refusing residential treatment. She reports that she follows up at [facility] for psychiatric  treatment. She states that the Haldol has worked for her for years and wants to continue it. Sh states that Prozac has worked well for her as well. She is informed of her elevated ammonia level and that she is started on Lactulose and she was diagnosed with Trich and started on Flagyl 500 mg BID x 7 days. She denies any SI/HI/AVH and contracts for safety.  Patient remained on the [facility] unit for 4 days and stabilized with medications, sobriety,  and therapy. Patient is started on Buspar 7.5 mg BID, Prozac 20 mg PO Daily, Trazodone 100 mg QHS PRN, Vistaril 25 mg TID PRN, and was diagnosed Pepcid 20 mg TIDWC for GERD. Patient showed improvement with improved mood, affect, sleep, appetite, and interaction. Patient has been seen in the day room interacting with peers and staff appropriately. Patient has been attending groups and participating. Patient denies any SI/HI/AVH and contracts for safety. Patient agrees to follow up at [facility] outpatient and [place] for her HEP  C and medical concerns. Patient is provided with prescriptions and samples of her medications upon discharge.    Physical Findings: AIMS: Facial and Oral Movements Muscles of Facial Expression: None, normal Lips and Perioral Area: None, normal Jaw: None, normal Tongue: None, normal,Extremity Movements Upper (arms, wrists, hands, fingers): None, normal Lower (legs, knees, ankles, toes): None, normal, Trunk Movements Neck, shoulders, hips: None, normal, Overall Severity Severity of abnormal movements (highest score from questions above): None, normal Incapacitation due to abnormal movements: None, normal Patient's awareness of abnormal movements (rate only patient's report): No Awareness, Dental Status Current problems with teeth and/or dentures?: No Does patient usually wear dentures?: No  CIWA:  CIWA-Ar Total: 3 COWS:     Musculoskeletal: Strength & Muscle Tone: within normal limits Gait & Station: normal Patient  leans: N/A  Psychiatric Specialty Exam: Physical Exam  Nursing note and vitals reviewed. Constitutional: She is oriented to person, place, and time. She appears well-developed and well-nourished.  Respiratory: Effort normal.  Musculoskeletal: Normal range of motion.  Neurological: She is alert and oriented to person, place, and time.  Skin: Skin is warm.    Review of Systems  Constitutional: Negative.   HENT: Negative.   Eyes: Negative.   Respiratory: Negative.   Cardiovascular: Negative.   Gastrointestinal: Negative.   Genitourinary: Negative.   Musculoskeletal: Negative.   Skin: Negative.   Neurological: Negative.   Endo/Heme/Allergies: Negative.   Psychiatric/Behavioral: Negative.     Blood pressure (!) 89/54, pulse 96, temperature 98.5 F (36.9 C), resp. rate 16, height 4' 11.5" (1.511 m), weight 55.8 kg (123 lb).Body mass index is 24.43 kg/m.  General Appearance: Casual  Eye Contact:  Good  Speech:  Clear and Coherent and Normal Rate  Volume:  Normal  Mood:  Angry  Affect:  Appropriate  Thought Process:  Goal Directed and Descriptions of Associations: Intact  Orientation:  Full (Time, Place, and Person)  Thought Content:  WDL  Suicidal Thoughts:  No  Homicidal Thoughts:  No  Memory:  Immediate;   Good Recent;   Good Remote;   Good  Judgement:  Good  Insight:  Good  Psychomotor Activity:  Normal  Concentration:  Concentration: Good and Attention Span: Good  Recall:  Good  Fund of Knowledge:  Good  Language:  Good  Akathisia:  No  Handed:  Right  AIMS (if indicated):     Assets:  Communication Skills Desire for Improvement Financial Resources/Insurance Housing Physical Health Social Support Transportation  ADL's:  Intact  Cognition:  WNL  Sleep:  Number of Hours: 6     Have you used any form of tobacco in the last 30 days? (Cigarettes, Smokeless Tobacco, Cigars, and/or Pipes): No  Has this patient used any form of tobacco in the last 30 days?  (Cigarettes, Smokeless Tobacco, Cigars, and/or Pipes) Yes, No  Blood Alcohol level:  Lab Results  Component Value Date   ETH 253 (H) 10/22/2017   ETH <10 09/98/3382    Metabolic Disorder Labs:  Lab Results  Component Value Date   HGBA1C 4.9 10/21/2017   No results found for: PROLACTIN No results found for: CHOL, TRIG, HDL, CHOLHDL, VLDL, LDLCALC  See Psychiatric Specialty Exam and Suicide Risk Assessment completed by Attending Physician prior to discharge.  Discharge destination:  Home  Is patient on multiple antipsychotic therapies at discharge:  No   Has Patient had three or more failed trials of antipsychotic monotherapy by history:  No  Recommended Plan for Multiple Antipsychotic Therapies: NA  Allergies as of 11/02/2017   No Known Allergies     Medication List    STOP taking these medications   chlordiazePOXIDE 10 MG capsule Commonly known as:  LIBRIUM   folic acid 1 MG tablet Commonly known as:  FOLVITE   haloperidol 2 MG tablet Commonly known as:  HALDOL   HYDROcodone-acetaminophen 5-325 MG tablet Commonly known as:  NORCO/VICODIN   pantoprazole 40 MG tablet Commonly known as:  PROTONIX   phenytoin 100 MG ER capsule Commonly known as:  DILANTIN   thiamine 100 MG tablet     TAKE these medications     Indication  busPIRone 7.5 MG tablet Commonly known as:  BUSPAR Take 1 tablet (7.5 mg total) by mouth 2 (two) times daily. For anxiety What changed:  additional instructions  Indication:  Anxiety Disorder   famotidine 20 MG tablet Commonly known as:  PEPCID Take 1 tablet (20 mg total) by mouth 2 (two) times daily.  Indication:  Gastroesophageal Reflux Disease   FLUoxetine 20 MG capsule Commonly known as:  PROZAC Take 1 capsule (20 mg total) by mouth daily. For mood control  Indication:  mood stability   furosemide 20 MG tablet Commonly known as:  LASIX Take 1 tablet (20 mg total) by mouth daily.  Indication:  per PCP   hydrOXYzine 25 MG  tablet Commonly known as:  ATARAX/VISTARIL Take 1 tablet (25 mg total) by mouth 3 (three) times daily as needed for anxiety.  Indication:  Feeling Anxious   metroNIDAZOLE 500 MG tablet Commonly known as:  FLAGYL Take 1 tablet (500 mg total) by mouth every 12 (twelve) hours. 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  Indication:  Per PCP   spironolactone 50 MG tablet Commonly known as:  ALDACTONE Take 1 tablet (50 mg total) by mouth daily.  Indication:  Hardening of the Liver   topiramate 50 MG tablet Commonly known as:  TOPAMAX Take 1 tablet (50 mg total) by mouth daily.  Indication:  Seizure   traZODone 100 MG tablet Commonly known as:  DESYREL Take 1 tablet (100 mg total) by mouth at bedtime as needed for sleep.  Indication:  [place]. Call on 11/18/2017.   Specialty:  Internal Medicine Why:  Follow up is scheduled for March 12th, 2019 at 1:30pm with Molli Barrows.  Contact information: [place] [phone]          Follow-up recommendations:  Continue activity as tolerated. Continue diet as recommended by your PCP. Ensure to keep all appointments with outpatient providers.  Comments:  Patient is instructed prior to discharge to: Take all medications as prescribed by his/her mental healthcare provider. Report any adverse effects and or reactions from the medicines to his/her outpatient provider promptly. Patient has been instructed & cautioned: To not engage in alcohol and or illegal drug use while on prescription medicines. In the event of worsening symptoms, patient is instructed to call the crisis hotline, 911 and or go to the nearest ED for appropriate evaluation and  treatment of symptoms. To follow-up with his/her primary care provider for your other medical issues, concerns and or health care needs.    Signed: Lowry Ram Money,  FNP 11/03/2017, 8:34 AM   Patient seen, Suicide Assessment Completed.  Disposition Plan Reviewed

## 2017-11-18 ENCOUNTER — Ambulatory Visit: Payer: Self-pay | Admitting: Family Medicine

## 2017-11-26 ENCOUNTER — Encounter

## 2017-11-28 MED FILL — traZODone HCL 100 MG TABS: 100 | 30 days supply | Qty: 30 | Fill #0

## 2017-11-28 MED FILL — FLUoxetine HCL 20 MG CAPS: 20 | 30 days supply | Qty: 30 | Fill #0

## 2017-11-28 MED FILL — busPIRone HCL 7.5 MG TABS: 7.5 | 30 days supply | Qty: 60 | Fill #0

## 2017-11-28 MED FILL — FAMOTIDINE 20 MG TABS: 20 | 30 days supply | Qty: 60 | Fill #0

## 2017-11-28 MED FILL — hydrOXYzine HCL 25 MG TABS: 25 | 10 days supply | Qty: 30 | Fill #0

## 2017-12-02 ENCOUNTER — Encounter: Payer: Self-pay | Admitting: Family Medicine

## 2017-12-02 ENCOUNTER — Ambulatory Visit (INDEPENDENT_AMBULATORY_CARE_PROVIDER_SITE_OTHER): Payer: Medicaid Other | Admitting: Family Medicine

## 2017-12-02 VITALS — BP 116/78 | HR 88 | Temp 98.2°F | Ht 59.5 in | Wt 130.0 lb

## 2017-12-02 DIAGNOSIS — Z01419 Encounter for gynecological examination (general) (routine) without abnormal findings: Secondary | ICD-10-CM | POA: Diagnosis not present

## 2017-12-02 DIAGNOSIS — E7220 Disorder of urea cycle metabolism, unspecified: Secondary | ICD-10-CM

## 2017-12-02 DIAGNOSIS — Z1231 Encounter for screening mammogram for malignant neoplasm of breast: Secondary | ICD-10-CM

## 2017-12-02 DIAGNOSIS — R252 Cramp and spasm: Secondary | ICD-10-CM

## 2017-12-02 DIAGNOSIS — Z1239 Encounter for other screening for malignant neoplasm of breast: Secondary | ICD-10-CM

## 2017-12-02 LAB — POCT URINALYSIS DIP (DEVICE)
Bilirubin Urine: NEGATIVE
Glucose, UA: NEGATIVE mg/dL
Hgb urine dipstick: NEGATIVE
Ketones, ur: NEGATIVE mg/dL
Leukocytes, UA: NEGATIVE
Nitrite: NEGATIVE
Protein, ur: NEGATIVE mg/dL
Specific Gravity, Urine: 1.01 (ref 1.005–1.030)
Urobilinogen, UA: 0.2 mg/dL (ref 0.0–1.0)
pH: 7 (ref 5.0–8.0)

## 2017-12-02 NOTE — Patient Instructions (Addendum)
Regional infectious disease -call to make an appointment [phone]  To schedule your breast exam, please call [facility] at   Phone: [phone]

## 2017-12-02 NOTE — Progress Notes (Signed)
Patient ID: Briana Sweeney, female    DOB: [birth year], 47 y.o.   MRN: [id number]  PCP: Scot Jun, FNP  Chief Complaint  Patient presents with  . Gynecologic Exam  . Follow-up    4 weeks on chronic condition    Subjective:  HPI  Briana Sweeney is a 47 y.o. female presents for gynecological exam. Briana Sweeney has a current history hepatitis C, cirrhosis of the liver, GERD, current  polysubstance abuse, major depressive disorder, and schizophrenia.   [facility] active major depressive disorder with suicidal ideations. She remained hospitalized for five days and discharged to follow-up here today. She admits to continued alcohol abuse. She appears to be currently under the influence of a substance her visit today. She was brought to her appointment today by a friend that she is currently living with. Briana Sweeney admits to drinking a total of 10 cans of beer within the last 24 hours. During her [facility] admission she was treated for an elevated ammonia level.  She reports experiencing forgetfulness and is concern that her ammonia level is elevated again. She is positive for Hepatitis C and history of liver disease. Previously referred to infectious disease however, has not followed-up. Currently denies suicidal ideations, homicidal ideations, or auditory hallucination.   Gynecological Exam  Unknown when last PAP was performed. Uncertain of prior abnormal PAP. Denies any known family history if breast or gynecological cancers. She doesn't perform self breast exam. She has not had breast mammogram. She is sexual active doesn't routine use barrier   protection. During last visit, she was diagnosed with trichomoniasis and treated with a 7 day course of metronidazole.  Social History   Socioeconomic History  . Marital status: Legally Separated    Spouse name: Not on file  . Number of children: Not on file  . Years of education: Not on file  . Highest  education level: Not on file  Occupational History  . Occupation: applying for disability  Social Needs  . Financial resource strain: Not on file  . Food insecurity:    Worry: Not on file    Inability: Not on file  . Transportation needs:    Medical: Not on file    Non-medical: Not on file  Tobacco Use  . Smoking status: Never Smoker  . Smokeless tobacco: Never Used  Substance and Sexual Activity  . Alcohol use: Yes    Alcohol/week: 268.8 oz    Types: 448 Shots of liquor per week    Comment: 09/01/2017 "maybe 1/2 gallon vodka/day"  . Drug use: No  . Sexual activity: Not Currently  Lifestyle  . Physical activity:    Days per week: Not on file    Minutes per session: Not on file  . Stress: Not on file  Relationships  . Social connections:    Talks on phone: Not on file    Gets together: Not on file    Attends religious service: Not on file    Active member of club or organization: Not on file    Attends meetings of clubs or organizations: Not on file    Relationship status: Not on file  . Intimate partner violence:    Fear of current or ex partner: Not on file    Emotionally abused: Not on file    Physically abused: Not on file    Forced sexual activity: Not on file  Other Topics Concern  . Not on file  Social History Narrative  She moved with a boyfriend to [place] and was followed at [facility].  He died of a massive heart attack in 8/18, per her report, and so she moved back to [place] and is living with a friend.    Family History  Problem Relation Age of Onset  . Lung cancer Mother 58  . Alcohol abuse Mother   . Throat cancer Father 42   Review of Systems Pertinent negative included in HPI  Patient Active Problem List   Diagnosis Date Noted  . GERD (gastroesophageal reflux disease) 11/01/2017  . Trichimoniasis 10/30/2017  . Alcohol dependence ([place]) 10/29/2017  . MDD (major depressive disorder), severe ([place]) 10/28/2017  . Acute hyperactive  alcohol withdrawal delirium ([place]) 10/09/2017  . Schizophrenia ([place]) 10/09/2017  . Alcohol abuse with alcohol-induced mood disorder ([place])   . Alcohol withdrawal ([place]) 09/11/2017  . Esophageal varices without bleeding ([place])   . Hematemesis 09/01/2017  . Ascites due to alcoholic cirrhosis ([place])   . Decompensated hepatic cirrhosis ([place]) 07/23/2017  . Alcohol abuse 07/23/2017  . Hepatitis C, chronic ([place]) 07/23/2017  . Hypokalemia 07/23/2017  . Jaundice 07/23/2017  . Coagulopathy ([place]) 07/23/2017  . Hypomagnesemia 07/23/2017  . Pancytopenia ([place]) 07/23/2017  . Alcoholic cirrhosis of liver with ascites ([place]) 07/23/2017    No Known Allergies  Prior to Admission medications   Medication Sig Start Date End Date Taking? Authorizing Provider  busPIRone (BUSPAR) 7.5 MG tablet Take 1 tablet (7.5 mg total) by mouth 2 (two) times daily. For anxiety 11/02/17  Yes Money, Lowry Ram, FNP  famotidine (PEPCID) 20 MG tablet Take 1 tablet (20 mg total) by mouth 2 (two) times daily. 11/02/17  Yes Money, Lowry Ram, FNP  FLUoxetine (PROZAC) 20 MG capsule Take 1 capsule (20 mg total) by mouth daily. For mood control 11/02/17  Yes Money, Lowry Ram, FNP  furosemide (LASIX) 20 MG tablet Take 1 tablet (20 mg total) by mouth daily. 10/21/17  Yes Scot Jun, FNP  hydrOXYzine (ATARAX/VISTARIL) 25 MG tablet Take 1 tablet (25 mg total) by mouth 3 (three) times daily as needed for anxiety. 11/02/17  Yes Money, Lowry Ram, FNP  pentoxifylline (TRENTAL) 400 MG CR tablet Take 1 tablet (400 mg total) by mouth 3 (three) times daily with meals. 11/02/17  Yes Money, Lowry Ram, FNP  spironolactone (ALDACTONE) 50 MG tablet Take 1 tablet (50 mg total) by mouth daily. 10/21/17  Yes Scot Jun, FNP  topiramate (TOPAMAX) 50 MG tablet Take 1 tablet (50 mg total) by mouth daily. 10/21/17  Yes Scot Jun, FNP  traZODone (DESYREL) 100 MG tablet Take 1 tablet (100 mg total) by mouth at bedtime as needed for sleep. 11/02/17  Yes  Money, Lowry Ram, FNP    Past Medical, Surgical Family and Social History reviewed and updated.    Objective:   Today's Vitals   12/02/17 0852  BP: 116/78  Pulse: 88  Temp: 98.2 F (36.8 C)  TempSrc: Oral  SpO2: 97%  Weight: 130 lb (59 kg)  Height: 4' 11.5" (1.511 m)    Wt Readings from Last 3 Encounters:  12/02/17 130 lb (59 kg)  10/22/17 129 lb (58.5 kg)  10/21/17 129 lb (58.5 kg)   Physical Exam  Cardiovascular: Normal rate, regular rhythm, normal heart sounds and intact distal pulses.  Pulmonary/Chest: Effort normal and breath sounds normal.  Abdominal: She exhibits no distension and no mass. There is no tenderness. There is no rebound and no guarding.  Genitourinary:  Genitourinary Comments:  Breasts are symmetric although very dense breast tissue-ithout cutaneous changes, nipple inversion or discharge. No masses or tenderness, and no axillary lymphadenopathy. Normal female external genitalia without lesion. No inguinal lymphadenopathy. Vaginal mucosa is pink and moist without lesions. Cervix is closed without discharge, not friable. Pap smear obtained. No cervical motion tenderness, adnexal fullness or tenderness.   Neurological: She is alert.  Psychiatric: Thought content normal. Her affect is labile. Her speech is rapid and/or pressured. She is hyperactive. Cognition and memory are impaired. She expresses impulsivity.  Appears intoxicated.   Assessment & Plan:  1. Breast cancer screening- MM Digital Screening; Future  2. Hyperammonemia ([employer]), repeat ammonia level. Patient encouraged to discontinue excessive alcohol use. Checking ammonia level and CBC with Differential  3. Encounter for gynecological examination with Papanicolaou smear of cervix- Pap IG, CT/NG w/ reflex HPV when ASC-U  4. Leg cramps, checking Comprehensive metabolic panel  Orders Placed This Encounter  Procedures  . MM Digital Screening    Standing Status:   Future    Standing Expiration Date:    02/02/2019    Order Specific Question:   Reason for Exam (SYMPTOM  OR DIAGNOSIS REQUIRED)    Answer:   screen breast cancer    Order Specific Question:   Is the patient pregnant?    Answer:   No    Order Specific Question:   Preferred imaging location?    Answer:   [facility]  . Ammonia  . Comprehensive metabolic panel  . CBC with Differential  . POCT urinalysis dip (device)    Carroll Sage. Kenton Kingfisher, MSN, FNP-C [facility]  [address] [place], [place] [address] [phone]

## 2017-12-03 LAB — CBC WITH DIFFERENTIAL/PLATELET
Basophils Absolute: 0 10*3/uL (ref 0.0–0.2)
Basos: 0 %
EOS (ABSOLUTE): 0.1 10*3/uL (ref 0.0–0.4)
Eos: 3 %
Hematocrit: 32 % — ABNORMAL LOW (ref 34.0–46.6)
Hemoglobin: 10.3 g/dL — ABNORMAL LOW (ref 11.1–15.9)
Immature Grans (Abs): 0 10*3/uL (ref 0.0–0.1)
Immature Granulocytes: 0 %
Lymphocytes Absolute: 1.1 10*3/uL (ref 0.7–3.1)
Lymphs: 40 %
MCH: 28.4 pg (ref 26.6–33.0)
MCHC: 32.2 g/dL (ref 31.5–35.7)
MCV: 88 fL (ref 79–97)
Monocytes Absolute: 0.3 10*3/uL (ref 0.1–0.9)
Monocytes: 10 %
Neutrophils Absolute: 1.3 10*3/uL — ABNORMAL LOW (ref 1.4–7.0)
Neutrophils: 47 %
RBC: 3.63 x10E6/uL — ABNORMAL LOW (ref 3.77–5.28)
RDW: 19.6 % — ABNORMAL HIGH (ref 12.3–15.4)
WBC: 2.7 10*3/uL — ABNORMAL LOW (ref 3.4–10.8)

## 2017-12-03 LAB — COMPREHENSIVE METABOLIC PANEL
ALT: 16 IU/L (ref 0–32)
AST: 50 IU/L — ABNORMAL HIGH (ref 0–40)
Albumin/Globulin Ratio: 0.8 — ABNORMAL LOW (ref 1.2–2.2)
Albumin: 3.6 g/dL (ref 3.5–5.5)
Alkaline Phosphatase: 195 IU/L — ABNORMAL HIGH (ref 39–117)
BUN/Creatinine Ratio: 14 (ref 9–23)
BUN: 7 mg/dL (ref 6–24)
Bilirubin Total: 0.7 mg/dL (ref 0.0–1.2)
CO2: 19 mmol/L — ABNORMAL LOW (ref 20–29)
Calcium: 8.3 mg/dL — ABNORMAL LOW (ref 8.7–10.2)
Chloride: 105 mmol/L (ref 96–106)
Creatinine, Ser: 0.49 mg/dL — ABNORMAL LOW (ref 0.57–1.00)
GFR calc Af Amer: 136 mL/min/{1.73_m2} (ref >59)
GFR calc non Af Amer: 118 mL/min/{1.73_m2} (ref >59)
Globulin, Total: 4.5 g/dL (ref 1.5–4.5)
Glucose: 90 mg/dL (ref 65–99)
Potassium: 4 mmol/L (ref 3.5–5.2)
Sodium: 138 mmol/L (ref 134–144)
Total Protein: 8.1 g/dL (ref 6.0–8.5)

## 2017-12-03 LAB — AMMONIA: Ammonia: 87 ug/dL (ref 19–87)

## 2017-12-04 LAB — PAP IG, CT-NG, RFX HPV ASCU
Chlamydia, Nuc. Acid Amp: NEGATIVE
Gonococcus by Nucleic Acid Amp: NEGATIVE
PAP Smear Comment: 0

## 2017-12-05 ENCOUNTER — Encounter: Payer: Self-pay | Admitting: Family Medicine

## 2017-12-05 ENCOUNTER — Other Ambulatory Visit: Payer: Self-pay | Admitting: Family Medicine

## 2017-12-05 DIAGNOSIS — Z1231 Encounter for screening mammogram for malignant neoplasm of breast: Secondary | ICD-10-CM

## 2017-12-05 NOTE — Progress Notes (Signed)
erroneous

## 2017-12-05 NOTE — Progress Notes (Signed)
Mail lab letter  

## 2017-12-10 ENCOUNTER — Encounter

## 2017-12-10 ENCOUNTER — Telehealth: Payer: Self-pay

## 2017-12-11 ENCOUNTER — Encounter: Attending: Hematology & Oncology

## 2017-12-11 ENCOUNTER — Encounter

## 2017-12-11 NOTE — Telephone Encounter (Signed)
Patient called and left a message requesting pain medication and reporting swelling. She suffers from chronic liver disease and hep C. She is uninsured and therefore has not see GI. If she is experiencing worsening swelling she will need to go to the ED. Patient has requested pain medication in the past and this request has been denied as patient as current substance abuse problem. Advise patient to go to the ED if she is still experiencing these concerns. I'm only in the office for a few hours covering for an absent provider.   Carroll Sage. Kenton Kingfisher, MSN, FNP-C [facility]  [address] [place], [place] [address] [phone]

## 2017-12-11 NOTE — Telephone Encounter (Signed)
Tried to contact patient no answer 

## 2017-12-11 NOTE — Telephone Encounter (Signed)
Tired to contact patient no answer and no vm 

## 2017-12-12 NOTE — Telephone Encounter (Signed)
Tried to contact patient no answer and no vm 

## 2017-12-15 NOTE — Telephone Encounter (Signed)
Was unable to reach patient regarding medication and swelling that she was having.

## 2017-12-16 ENCOUNTER — Ambulatory Visit (INDEPENDENT_AMBULATORY_CARE_PROVIDER_SITE_OTHER): Payer: Self-pay | Admitting: Infectious Diseases

## 2017-12-16 ENCOUNTER — Other Ambulatory Visit: Payer: Self-pay

## 2017-12-16 ENCOUNTER — Encounter: Payer: Self-pay | Admitting: Infectious Diseases

## 2017-12-16 VITALS — BP 116/82 | HR 85 | Temp 98.2°F | Ht 59.0 in | Wt 128.0 lb

## 2017-12-16 DIAGNOSIS — K7031 Alcoholic cirrhosis of liver with ascites: Secondary | ICD-10-CM

## 2017-12-16 DIAGNOSIS — R768 Other specified abnormal immunological findings in serum: Secondary | ICD-10-CM

## 2017-12-16 NOTE — Patient Instructions (Addendum)
Nice to meet you today.   Although your Hepatitis C antibody (memory stamp) is positive your test for active infection was negative. This means you cleared the infection on your own and do not need treatment.   You can get re-infected with this through further risky behavior (IV drugs, unregulated tattoos/piercings, for example).   Would recommend your primary care team consider referral to Dr. Ardis Hughs with [facility] for liver care. And to check your blood for protection against hepatitis A and B.

## 2017-12-16 NOTE — Assessment & Plan Note (Addendum)
Previously was working with liver specialist. Would recommend consideration of referral to [facility] vs [facility] for management of her liver disease.

## 2017-12-16 NOTE — Telephone Encounter (Signed)
Patient is requesting refills on these medication and you didn't prescribe them.

## 2017-12-16 NOTE — Assessment & Plan Note (Addendum)
With Qualitative RNA negative in the present of (+) antibody and no previous treatment this is consistent with spontaneous clearance of her Hep C infection. No indications for treatment at this time.   She does not proclaim any ongoing behavior that would risk re-infection (ie: she stopped IV drugs over 10 years ago). Educated her that her antibody will always be positive but does not confer active infection.   Counseled on avoiding ASA-containing products and NSAIDs considering she has evidence of synthetic liver dysfunction (plt 30k and elevated PT/INR). Limiting tylenol to < 2000 mg daily. Would recommend complete cessation of alcohol to avoid further damage to her liver. This would also make her in-elligible for liver transplantation in the future should she need it.   With her cirrhosis I would recommend consideration of hepatocellular cancer screenings every 6 months via ultrasound. Would also recommend primary care team screen for hepatitis A and B immunity with next lab draw and vaccinate accordingly. Recommended Pneumovax.  Follow up with her PCP and liver specialist.

## 2017-12-16 NOTE — Progress Notes (Signed)
Briana Sweeney for Infectious Disease   CC: consideration for treatment for chronic hepatitis C  HPI: Briana Sweeney is a 47 y.o. female who presents for initial evaluation and management of chronic hepatitis C.  Briana Sweeney tells me that she was informed at her PCP's office that she had hepatitis C infection. She has previously been in care with liver specialist for cirrhosis; has had this diagnosis for at least 7 years. Previously a very heavy drinker and now only occasinally drinks. She has a history of IV drug experimentation. Never made a habit of it but did share needles/drug related items. She is very nervous today about treatment for her Hep C with her current mental health and established liver disease.   Records reviewed from Briana Jun, FNP in [place].  Has had the shots for A and B per her account but no proven immunity in chart review.   Review of Systems:  Constitutional: negative Eyes: negative Cardiovascular: occasional chest pain Gastrointestinal: abdominal bloating/fullness Musculoskeletal: negative Neurological: negative Behavioral/Psych: positive for schizophrenia, depression and excessive alcohol consumption All other systems reviewed and are negative     Past Medical History:  Diagnosis Date  . Anxiety   . Bipolar affective disorder ([place])    With anxiety features  . Cirrhosis of liver ([place])    Due to alcohol and hepatitis C  . Depression   . GERD (gastroesophageal reflux disease)   . Hepatitis C 2018   hepatitis c and alcohol related hepatitis  . History of blood transfusion    "blood doesn't clot; I fell down and had to have a transfusion"  . History of kidney stones   . Migraine    "when I get really stressed" (09/01/2017)  . Schizophrenia ([place])   . Seizures ([place])    "when I run out of my RX; lots recently" (09/01/2017)   Outpatient Medications Prior to Visit  Medication Sig Dispense Refill  . busPIRone (BUSPAR) 7.5 MG tablet Take 1 tablet (7.5  mg total) by mouth 2 (two) times daily. For anxiety 60 tablet 0  . famotidine (PEPCID) 20 MG tablet Take 1 tablet (20 mg total) by mouth 2 (two) times daily. 60 tablet 0  . FLUoxetine (PROZAC) 20 MG capsule Take 1 capsule (20 mg total) by mouth daily. For mood control 30 capsule 0  . furosemide (LASIX) 20 MG tablet Take 1 tablet (20 mg total) by mouth daily. 90 tablet 2  . hydrOXYzine (ATARAX/VISTARIL) 25 MG tablet Take 1 tablet (25 mg total) by mouth 3 (three) times daily as needed for anxiety. 30 tablet 0  . pentoxifylline (TRENTAL) 400 MG CR tablet Take 1 tablet (400 mg total) by mouth 3 (three) times daily with meals. 90 tablet 0  . spironolactone (ALDACTONE) 50 MG tablet Take 1 tablet (50 mg total) by mouth daily. 90 tablet 2  . topiramate (TOPAMAX) 50 MG tablet Take 1 tablet (50 mg total) by mouth daily. 90 tablet 1  . traZODone (DESYREL) 100 MG tablet Take 1 tablet (100 mg total) by mouth at bedtime as needed for sleep. 30 tablet 0   No facility-administered medications prior to visit.     No Known Allergies  Social History   Tobacco Use  . Smoking status: Never Smoker  . Smokeless tobacco: Never Used  Substance Use Topics  . Alcohol use: Yes    Alcohol/week: 16.8 oz    Types: 28 Cans of beer per week    Comment: weekly "I have cut back"  .  Drug use: No    Family History  Problem Relation Age of Onset  . Lung cancer Mother 40  . Alcohol abuse Mother   . Throat cancer Father 56     Objective:  Constitutional: in no apparent distress, well developed and well nourished, alert and oriented times 3 Vitals:   12/16/17 0915  BP: 116/82  Pulse: 85  Temp: 98.2 F (36.8 C)   Laboratory Genotype: No results found for: HCVGENOTYPE   HCV viral load: Qualitative RNA negative 07/2017  HIV negative 10/13/2017  Lab Results  Component Value Date   WBC 2.7 (L) 12/02/2017   HGB 10.3 (L) 12/02/2017   HCT 32.0 (L) 12/02/2017   MCV 88 12/02/2017   PLT CANCELED 12/02/2017      Lab Results  Component Value Date   CREATININE 0.49 (L) 12/02/2017   BUN 7 12/02/2017   NA 138 12/02/2017   K 4.0 12/02/2017   CL 105 12/02/2017   CO2 19 (L) 12/02/2017    Lab Results  Component Value Date   ALT 16 12/02/2017   AST 50 (H) 12/02/2017   ALKPHOS 195 (H) 12/02/2017    Lab Results  Component Value Date   INR 1.20 09/23/2017   BILITOT 0.7 12/02/2017   ALBUMIN 3.6 12/02/2017    Assessment & Plan:   Problem List Items Addressed This Visit      Digestive   Alcoholic cirrhosis of liver with ascites ([place]) - Primary    Previously was working with liver specialist. Would recommend consideration of referral to [facility] vs [facility] Hepatology for management of her liver disease.        Other   Hepatitis C antibody positive in blood    With Qualitative RNA negative in the present of (+) antibody and no previous treatment this is consistent with spontaneous clearance of her Hep C infection. No indications for treatment at this time.   She does not proclaim any ongoing behavior that would risk re-infection (ie: she stopped IV drugs over 10 years ago). Educated her that her antibody will always be positive but does not confer active infection.   Counseled on avoiding ASA-containing products and NSAIDs considering she has evidence of synthetic liver dysfunction (plt 30k and elevated PT/INR). Limiting tylenol to < 2000 mg daily. Would recommend complete cessation of alcohol to avoid further damage to her liver. This would also make her in-elligible for liver transplantation in the future should she need it.   With her cirrhosis I would recommend consideration of hepatocellular cancer screenings every 6 months via ultrasound. Would also recommend primary care team screen for hepatitis A and B immunity with next lab draw and vaccinate accordingly. Recommended Pneumovax.  Follow up with her PCP and liver specialist.         Briana Madeira, MSN, NP-C [facility]  [place] Office: [phone] Pager: [phone]  12/16/17 1:34 PM

## 2017-12-31 MED FILL — HALOPERIDOL 2 MG TABLET: 2 | 30 days supply | Qty: 30 | Fill #0

## 2018-01-01 ENCOUNTER — Emergency Department (HOSPITAL_COMMUNITY)
Admission: EM | Admit: 2018-01-01 | Discharge: 2018-01-01 | Disposition: A | Discharge status: Home / Self Care | Payer: Self-pay | Attending: Emergency Medicine | Admitting: Emergency Medicine

## 2018-01-01 ENCOUNTER — Encounter (HOSPITAL_COMMUNITY): Payer: Self-pay | Admitting: Emergency Medicine

## 2018-01-01 DIAGNOSIS — F329 Major depressive disorder, single episode, unspecified: Secondary | ICD-10-CM | POA: Insufficient documentation

## 2018-01-01 DIAGNOSIS — F29 Unspecified psychosis not due to a substance or known physiological condition: Secondary | ICD-10-CM | POA: Insufficient documentation

## 2018-01-01 DIAGNOSIS — Z046 Encounter for general psychiatric examination, requested by authority: Secondary | ICD-10-CM | POA: Insufficient documentation

## 2018-01-01 DIAGNOSIS — F10920 Alcohol use, unspecified with intoxication, uncomplicated: Secondary | ICD-10-CM | POA: Insufficient documentation

## 2018-01-01 DIAGNOSIS — F22 Delusional disorders: Secondary | ICD-10-CM | POA: Insufficient documentation

## 2018-01-01 DIAGNOSIS — Z79899 Other long term (current) drug therapy: Secondary | ICD-10-CM | POA: Insufficient documentation

## 2018-01-01 DIAGNOSIS — Z9114 Patient's other noncompliance with medication regimen: Secondary | ICD-10-CM | POA: Insufficient documentation

## 2018-01-01 LAB — CBC
HCT: 37.6 % (ref 36.0–46.0)
Hemoglobin: 11.9 g/dL — ABNORMAL LOW (ref 12.0–15.0)
MCH: 29.1 pg (ref 26.0–34.0)
MCHC: 31.6 g/dL (ref 30.0–36.0)
MCV: 91.9 fL (ref 78.0–100.0)
Platelets: 13 10*3/uL — CL (ref 150–400)
RBC: 4.09 MIL/uL (ref 3.87–5.11)
RDW: 20.7 % — ABNORMAL HIGH (ref 11.5–15.5)
WBC: 3.1 10*3/uL — ABNORMAL LOW (ref 4.0–10.5)

## 2018-01-01 LAB — RAPID URINE DRUG SCREEN, HOSP PERFORMED
Amphetamines: NOT DETECTED
Barbiturates: NOT DETECTED
Benzodiazepines: NOT DETECTED
Cocaine: NOT DETECTED
Opiates: NOT DETECTED
Tetrahydrocannabinol: POSITIVE — AB

## 2018-01-01 LAB — I-STAT BETA HCG BLOOD, ED (MC, WL, AP ONLY): I-stat hCG, quantitative: <5 m[IU]/mL (ref <5)

## 2018-01-01 LAB — COMPREHENSIVE METABOLIC PANEL
ALT: 41 U/L (ref 14–54)
AST: 99 U/L — ABNORMAL HIGH (ref 15–41)
Albumin: 3.2 g/dL — ABNORMAL LOW (ref 3.5–5.0)
Alkaline Phosphatase: 247 U/L — ABNORMAL HIGH (ref 38–126)
Anion gap: 11 (ref 5–15)
BUN: 5 mg/dL — ABNORMAL LOW (ref 6–20)
CO2: 21 mmol/L — ABNORMAL LOW (ref 22–32)
Calcium: 8 mg/dL — ABNORMAL LOW (ref 8.9–10.3)
Chloride: 111 mmol/L (ref 101–111)
Creatinine, Ser: 0.6 mg/dL (ref 0.44–1.00)
GFR calc Af Amer: >60 mL/min (ref >60)
GFR calc non Af Amer: >60 mL/min (ref >60)
Glucose, Bld: 111 mg/dL — ABNORMAL HIGH (ref 65–99)
Potassium: 3.3 mmol/L — ABNORMAL LOW (ref 3.5–5.1)
Sodium: 143 mmol/L (ref 135–145)
Total Bilirubin: 1.4 mg/dL — ABNORMAL HIGH (ref 0.3–1.2)
Total Protein: 8.7 g/dL — ABNORMAL HIGH (ref 6.5–8.1)

## 2018-01-01 LAB — ETHANOL: Alcohol, Ethyl (B): 466 mg/dL (ref <10)

## 2018-01-01 LAB — MAGNESIUM: Magnesium: 1.7 mg/dL (ref 1.7–2.4)

## 2018-01-01 MED ORDER — FLUOXETINE HCL 20 MG PO CAPS
20.0000 mg | ORAL_CAPSULE | Freq: Every day | ORAL | Status: DC
  Filled 2018-01-01: qty 1

## 2018-01-01 MED ORDER — PENTOXIFYLLINE ER 400 MG PO TBCR
400.0000 mg | EXTENDED_RELEASE_TABLET | Freq: Three times a day (TID) | ORAL | Status: DC
  Filled 2018-01-01 (×3): qty 1

## 2018-01-01 MED ORDER — TOPIRAMATE 25 MG PO TABS: 50.0000 mg | ORAL_TABLET | Freq: Every day | ORAL | Status: DC

## 2018-01-01 MED ORDER — SODIUM CHLORIDE 0.9 % IV BOLUS
2000.0000 mL | Freq: Once | INTRAVENOUS | Status: AC
  Administered 2018-01-01: 2000 mL via INTRAVENOUS

## 2018-01-01 MED ORDER — LORAZEPAM 2 MG/ML IJ SOLN: 0.0000 mg | Freq: Four times a day (QID) | INTRAMUSCULAR | Status: DC

## 2018-01-01 MED ORDER — BUSPIRONE HCL 15 MG PO TABS
7.5000 mg | ORAL_TABLET | Freq: Two times a day (BID) | ORAL | Status: DC
  Filled 2018-01-01: qty 1

## 2018-01-01 MED ORDER — THIAMINE HCL 100 MG/ML IJ SOLN: 100.0000 mg | Freq: Every day | INTRAMUSCULAR | Status: DC

## 2018-01-01 MED ORDER — THIAMINE HCL 100 MG/ML IJ SOLN
Freq: Once | INTRAVENOUS | Status: AC
  Administered 2018-01-01: 06:00:00 via INTRAVENOUS
  Filled 2018-01-01: qty 1000

## 2018-01-01 MED ORDER — TRAZODONE HCL 50 MG PO TABS: 100.0000 mg | ORAL_TABLET | Freq: Every evening | ORAL | Status: DC | PRN

## 2018-01-01 MED ORDER — VITAMIN B-1 100 MG PO TABS: 100.0000 mg | ORAL_TABLET | Freq: Every day | ORAL | Status: DC

## 2018-01-01 MED ORDER — HYDROXYZINE HCL 25 MG PO TABS: 25.0000 mg | ORAL_TABLET | Freq: Three times a day (TID) | ORAL | Status: DC | PRN

## 2018-01-01 MED ORDER — LORAZEPAM 1 MG PO TABS
0.0000 mg | ORAL_TABLET | Freq: Four times a day (QID) | ORAL | Status: DC
  Administered 2018-01-01: 1 mg via ORAL
  Administered 2018-01-01: 2 mg via ORAL
  Filled 2018-01-01: qty 2
  Filled 2018-01-01: qty 1
  Filled 2018-01-01: qty 2

## 2018-01-01 MED ORDER — FAMOTIDINE 20 MG PO TABS: 20.0000 mg | ORAL_TABLET | Freq: Two times a day (BID) | ORAL | Status: DC

## 2018-01-01 MED ORDER — LORAZEPAM 1 MG PO TABS: 0.0000 mg | ORAL_TABLET | Freq: Two times a day (BID) | ORAL | Status: DC

## 2018-01-01 MED ORDER — LORAZEPAM 2 MG/ML IJ SOLN: 0.0000 mg | Freq: Two times a day (BID) | INTRAMUSCULAR | Status: DC

## 2018-01-01 MED ORDER — FUROSEMIDE 20 MG PO TABS: 20.0000 mg | ORAL_TABLET | Freq: Every day | ORAL | Status: DC

## 2018-01-01 MED ORDER — SPIRONOLACTONE 50 MG PO TABS
50.0000 mg | ORAL_TABLET | Freq: Every day | ORAL | Status: DC
  Filled 2018-01-01: qty 1

## 2018-01-01 NOTE — Discharge Instructions (Addendum)
Substance Abuse Treatment Programs ° °Intensive Outpatient Programs °[facility]     °[address]      °[place], [place]                   °[phone]      ° °[facility] °[address] °[place], [place] °[phone] ° °[facility]     °(Inpatient and outpatient)     °[address]           °[phone]   ° °[facility] °[phone] (Suboxone and Methadone) ° °[address]      °[place], [place] [address]      °[phone]      ° °[address] °[place], [place] °852-3033 ° °[facility] (Outpatient/Inpatient, [facility])    °(insurance only) [phone]      °       °[facility] (Groups & Residential) °[place], [place] °[phone] ° °   °[facility]     °[address]     °[place], [place]      °[phone]      ° °[facility] (for caregivers and family) °[address] °[place], [place] °336-299-4655 ° ° ° ° ° °Residential Treatment Programs °[facility]      °[address], [place], [place] [address]  °([phone]      ° °[facility] °[address] [place], [place] [address] °[phone] ° °[facility]        °[phone]      ° °[facility] °[phone] ° °[facility]             °[address]                                         °[place], [place]                                                °[phone] or [phone]                              ° °[facility] [place] °[address] °[place], [address] °[phone] ° °[facility]    °[address]      °[place], [place]     °[phone]      ° °[facility] °[address] °[place], [place] °[phone] ° °[facility]   °[address]     °[place], [place] [address]     °[phone]      °Admissions: 8am-3pm M-F ° °[facility] °[address] °[place],  [place] °336-227-7417 ° °[facility]: Residential Program (90 Days)   °[place],       °336-725-8389 or [phone]    ° °[facility] °[place],  °(Walk in Hours over the weekend or by referral) ° °[facility] [facility] °[address], [place],  [address] °([phone] ° °Crisis Mobile: [facility]:  1-877-626-1772 (for crisis response 24 hours a day) °[facility] Hotline:      1-800-256-2452 °Outpatient Psychiatry and Counseling ° °[facility]: Mobile Crisis   Management 24 hours:  1-877-626-1772 ° °[facility] sliding scale fee and walk in schedule: M-F 8am-12pm/1pm-3pm °[address]  °[place], [place] [address] °[phone] ° °[facility] °[address] °[place], [place] [address] °[phone] ° °[facility] ([facility])- new patient walk-in appointments available Monday - Friday 8am -3pm.          °[address] °[place], [place] [address] °336-676-6840 or crisis line- 336-676-6905 ° °[facility] Intensive Outpatient Therapy Program °[address] °[place], [place] [address] °336-832-9804 ° °[facility]                  °Crisis Services      °[phone]      °[address]     °[place], [place] [address]                ° °[facility]   °[facility] °[phone] °[address] °[place], [place] [address] ° ° °[facility]          °[address]  °[place], [place] [address]       °[phone] ° °[facility] °[address] °[place], [place] [address] °336-292-1510 ° °[facility]    °[address]    °[place], [place] [address]     °[phone]      ° °Parish McKinney, MD     °[address]     °[place] [address]     °336-282-1251     °  °[facility] °[address] °[place] [address] ° °[facility]     °[address]     °[place], [place]      °336-542-2076      ° °[facility] °Shamsher Ahluwalia, MD °[address] °[place], [place] [address] °[phone] ° °[facility]     °[address]     °[place], [place] [address]     °336-274-1237      ° °[facility] °[address] °[place], [place] [address] °336-854-4450 °Resources for Temporary Residential Assistance/Crisis Centers ° °DAY CENTERS °[facility] °M-F 8am-3pm   °[address] [place], [place] [address]   336-332-0824 °Services include: laundry, barbering, support groups, case management, phone  & computer access, showers, AA/NA mtgs, mental health/substance abuse nurse, job skills class, disability information, VA assistance, spiritual classes, etc.  ° °HOMELESS SHELTERS ° °[facility]     °[facility]   °[address], [place]     °[phone]       °       °[facility] (women and children)       °[address] °[place], [place] [address] °336-275-0820 °[email or web address] for application and process °Application Required ° °[facility]   °[address]    °[place] [address]     °[phone]       °             °[facility] °[address] °[place],  [address] °[phone] °336-235-0363(schedule application appt.) °Application Required ° °[facility] (women only)    °[address]     °[place],  [address]     °336-884-1039      °  Intake starts 6pm daily °Need valid ID, SSC, & Police report °[facility] [place] °[address] °[place], [place] °336-881-5420 °Application Required ° °[facility] (men only)     °[address]      °[place], [place]     °[phone]      ° °[facility] °(Pregnant women only) °[address] °[place], [place] °336-275-0206 ° °[facility]      °[address]      °[place], [place] [address]     °[phone]      °       °[facility] [place] [facility] °[address] °[place], [place] °336-723-1848 °90 day commitment/SA/Application process ° °[facility](men only)     °[address]     °[place], [place]     °[phone]       °Check-in at 7pm     °       °[facility] °[address] °[place], [place] [address] °336-248-6684 °Men/Women/Women and Children must be there by 7 pm ° °[facility] °[place],  °336-722-8721                ° °

## 2018-01-01 NOTE — ED Notes (Signed)
Pt remains asleep, no distress noted

## 2018-01-01 NOTE — ED Triage Notes (Signed)
Belongings removed, pt placed in paper scrubs. Wanded by security.

## 2018-01-01 NOTE — ED Triage Notes (Signed)
BIB EMS from home, per EMS originally called out for a seizure which pt does have a hx of, while there pt admitted to heavy ETOH use over the past day as well as taking several Percocets that do not belong to her. Denies SI/HI

## 2018-01-01 NOTE — ED Provider Notes (Signed)
[facility] EMERGENCY DEPARTMENT Provider Note   CSN: [id number] Arrival date & time: 01/01/18  0321     History   Chief Complaint Chief Complaint  Patient presents with  . Alcohol Intoxication    HPI Briana Sweeney is a 47 y.o. female.  Patient brought to the ER by ambulance.  EMS was called to the house for possible seizure.  This was not witnessed by EMS.  When they got to the hospital he noted that she was heavily intoxicated.  She was transported to the ER.  She reports that she has a history of schizophrenia, has been off of her medications because she ran out.  She has been having auditory and visual hallucinations.  She is paranoid, thinks a prisoner is trying to break into her house to kill her and her dog.  Patient is visibly distraught, crying. Level V [place] due to psychiatric disorder.     Past Medical History:  Diagnosis Date  . Anxiety   . Bipolar affective disorder ([place])    With anxiety features  . Cirrhosis of liver ([place])    Due to alcohol and hepatitis C  . Depression   . GERD (gastroesophageal reflux disease)   . Hepatitis C 2018   hepatitis c and alcohol related hepatitis  . History of blood transfusion    "blood doesn't clot; I fell down and had to have a transfusion"  . History of kidney stones   . Migraine    "when I get really stressed" (09/01/2017)  . Schizophrenia ([place])   . Seizures ([place])    "when I run out of my RX; lots recently" (09/01/2017)    Patient Active Problem List   Diagnosis Date Noted  . GERD (gastroesophageal reflux disease) 11/01/2017  . Trichimoniasis 10/30/2017  . Alcohol dependence ([place]) 10/29/2017  . MDD (major depressive disorder), severe ([place]) 10/28/2017  . Acute hyperactive alcohol withdrawal delirium ([place]) 10/09/2017  . Schizophrenia ([place]) 10/09/2017  . Alcohol abuse with alcohol-induced mood disorder ([place])   . Alcohol withdrawal ([place]) 09/11/2017  . 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  Hematemesis 09/01/2017  . Ascites due to alcoholic cirrhosis ([place])   . Decompensated hepatic cirrhosis ([place]) 07/23/2017  . Alcohol abuse 07/23/2017  . Hepatitis C antibody positive in blood 07/23/2017  . Hypokalemia 07/23/2017  . Jaundice 07/23/2017  . Coagulopathy ([place]) 07/23/2017  . Hypomagnesemia 07/23/2017  . Pancytopenia ([place]) 07/23/2017  . Alcoholic cirrhosis of liver with ascites ([place]) 07/23/2017    Past Surgical History:  Procedure Laterality Date  . ESOPHAGOGASTRODUODENOSCOPY N/A 09/03/2017   Procedure: ESOPHAGOGASTRODUODENOSCOPY (EGD);  Surgeon: Doran Stabler, MD;  Location: [facility];  Service: Gastroenterology;  Laterality: N/A;  . FINGER FRACTURE SURGERY Left    "shattered my pinky"  . FRACTURE SURGERY    . IR PARACENTESIS  07/23/2017  . IR PARACENTESIS  07/2017   "did it twice in the same week" (09/01/2017)  . SHOULDER OPEN ROTATOR CUFF REPAIR Right   . TUBAL LIGATION    . VAGINAL HYSTERECTOMY       OB History   None      Home Medications    Prior to Admission medications   Medication Sig Start Date End Date Taking? Authorizing Provider  busPIRone (BUSPAR) 7.5 MG tablet Take 1 tablet (7.5 mg total) by mouth 2 (two) times daily. For anxiety 11/02/17   Money, Lowry Ram, FNP  famotidine (PEPCID) 20 MG tablet Take 1 tablet (20 mg total) by mouth  2 (two) times daily. 11/02/17   Money, Lowry Ram, FNP  FLUoxetine (PROZAC) 20 MG capsule Take 1 capsule (20 mg total) by mouth daily. For mood control 11/02/17   Money, Lowry Ram, FNP  furosemide (LASIX) 20 MG tablet Take 1 tablet (20 mg total) by mouth daily. 10/21/17   Scot Jun, FNP  hydrOXYzine (ATARAX/VISTARIL) 25 MG tablet Take 1 tablet (25 mg total) by mouth 3 (three) times daily as needed for anxiety. 11/02/17   Money, Lowry Ram, FNP  pentoxifylline (TRENTAL) 400 MG CR tablet Take 1 tablet (400 mg total) by mouth 3 (three) times daily with meals. 11/02/17   Money, Lowry Ram, FNP  spironolactone (ALDACTONE)  50 MG tablet Take 1 tablet (50 mg total) by mouth daily. 10/21/17   Scot Jun, FNP  topiramate (TOPAMAX) 50 MG tablet Take 1 tablet (50 mg total) by mouth daily. 10/21/17   Scot Jun, FNP  traZODone (DESYREL) 100 MG tablet Take 1 tablet (100 mg total) by mouth at bedtime as needed for sleep. 11/02/17   Money, Lowry Ram, FNP    Family History Family History  Problem Relation Age of Onset  . Lung cancer Mother 62  . Alcohol abuse Mother   . Throat cancer Father 56    Social History Social History   Tobacco Use  . Smoking status: Never Smoker  . Smokeless tobacco: Never Used  Substance Use Topics  . Alcohol use: Yes    Alcohol/week: 16.8 oz    Types: 28 Cans of beer per week    Comment: weekly "I have cut back"  . Drug use: No     Allergies   Patient has no known allergies.   Review of Systems Review of Systems  Unable to perform ROS: Psychiatric disorder     Physical Exam Updated Vital Signs BP 118/90 (BP Location: Left Arm)   Pulse (!) 106   Temp 97.9 F (36.6 C) (Oral)   Resp 20   Ht 4\' 11"  (1.499 m)   Wt 59 kg (130 lb)   LMP  (LMP Unknown)   SpO2 97%   BMI 26.26 kg/m   Physical Exam  Constitutional: She is oriented to person, place, and time. She appears well-developed and well-nourished. No distress.  HENT:  Head: Normocephalic and atraumatic.  Right Ear: Hearing normal.  Left Ear: Hearing normal.  Nose: Nose normal.  Mouth/Throat: Oropharynx is clear and moist and mucous membranes are normal.  Eyes: Pupils are equal, round, and reactive to light. Conjunctivae and EOM are normal.  Neck: Normal range of motion. Neck supple.  Cardiovascular: Regular rhythm, S1 normal and S2 normal. Exam reveals no gallop and no friction rub.  No murmur heard. Pulmonary/Chest: Effort normal and breath sounds normal. No respiratory distress. She exhibits no tenderness.  Abdominal: Soft. Normal appearance and bowel sounds are normal. There is no  hepatosplenomegaly. There is no tenderness. There is no rebound, no guarding, no tenderness at McBurney's point and negative Murphy's sign. No hernia.  Musculoskeletal: Normal range of motion.  Neurological: She is alert and oriented to person, place, and time. She has normal strength. No cranial nerve deficit or sensory deficit. Coordination normal. GCS eye subscore is 4. GCS verbal subscore is 5. GCS motor subscore is 6.  Skin: Skin is warm, dry and intact. No rash noted. No cyanosis.  Psychiatric: Her speech is delayed and slurred. She is slowed and withdrawn. Thought content is paranoid and delusional. She exhibits a depressed mood.  Nursing note and vitals reviewed.    ED Treatments / Results  Labs (all labs ordered are listed, but only abnormal results are displayed) Labs Reviewed  COMPREHENSIVE METABOLIC PANEL - Abnormal; Notable for the following components:      Result Value   Potassium 3.3 (*)    CO2 21 (*)    Glucose, Bld 111 (*)    BUN 5 (*)    Calcium 8.0 (*)    Total Protein 8.7 (*)    Albumin 3.2 (*)    AST 99 (*)    Alkaline Phosphatase 247 (*)    Total Bilirubin 1.4 (*)    All other components within normal limits  ETHANOL - Abnormal; Notable for the following components:   Alcohol, Ethyl (B) 466 (*)    All other components within normal limits  CBC - Abnormal; Notable for the following components:   WBC 3.1 (*)    Hemoglobin 11.9 (*)    RDW 20.7 (*)    Platelets 13 (*)    All other components within normal limits  RAPID URINE DRUG SCREEN, HOSP PERFORMED - Abnormal; Notable for the following components:   Tetrahydrocannabinol POSITIVE (*)    All other components within normal limits  MAGNESIUM  I-STAT BETA HCG BLOOD, ED ([facility], [facility], [facility] ONLY)    EKG None  Radiology No results found.  Procedures Procedures (including critical care time)  Medications Ordered in ED Medications  sodium chloride 0.9 % bolus 2,000 mL (has no administration in time range)    sodium chloride 0.9 % 1,000 mL with thiamine 419 mg, folic acid 1 mg, multivitamins adult 10 mL infusion (has no administration in time range)  LORazepam (ATIVAN) injection 0-4 mg (has no administration in time range)    Or  LORazepam (ATIVAN) tablet 0-4 mg (has no administration in time range)  LORazepam (ATIVAN) injection 0-4 mg (has no administration in time range)    Or  LORazepam (ATIVAN) tablet 0-4 mg (has no administration in time range)  thiamine (VITAMIN B-1) tablet 100 mg (has no administration in time range)    Or  thiamine (B-1) injection 100 mg (has no administration in time range)  busPIRone (BUSPAR) tablet 7.5 mg (has no administration in time range)  famotidine (PEPCID) tablet 20 mg (has no administration in time range)  FLUoxetine (PROZAC) capsule 20 mg (has no administration in time range)  furosemide (LASIX) tablet 20 mg (has no administration in time range)  hydrOXYzine (ATARAX/VISTARIL) tablet 25 mg (has no administration in time range)  pentoxifylline (TRENTAL) CR tablet 400 mg (has no administration in time range)  spironolactone (ALDACTONE) tablet 50 mg (has no administration in time range)  topiramate (TOPAMAX) tablet 50 mg (has no administration in time range)  traZODone (DESYREL) tablet 100 mg (has no administration in time range)     Initial Impression / Assessment and Plan / ED Course  I have reviewed the triage vital signs and the nursing notes.  Pertinent labs & imaging results that were available during my care of the patient were reviewed by me and considered in my medical decision making (see chart for details).     Patient with previous history of alcoholic cirrhosis, continued alcohol abuse and psychiatric disorder including bipolar disorder and schizophrenia presents with alcohol intoxication.  She had some type of an episode prior to EMS arriving on the scene that was thought to possibly be a seizure by bystanders, but has not had any seizure-like  activity here in the ER.  Patient's alcohol level is 466, will require monitoring until she is more sober for psychiatric evaluation.  Will continue home medications.  Final Clinical Impressions(s) / ED Diagnoses   Final diagnoses:  Alcoholic intoxication without complication ([place])  Psychosis, unspecified psychosis type [facility])    ED Discharge Orders    None       Betsey Holiday Gwenyth Allegra, MD 01/01/18 [phone]

## 2018-01-01 NOTE — ED Notes (Signed)
Date and time results received: 01/01/18 4:45 AM (use smartphrase ".now" to insert current time)  Test: ethanol Critical Value: 466  Name of Provider Notified: Dr. Betsey Holiday  Orders Received? Or Actions Taken?: MD will see pt in [place]

## 2018-01-01 NOTE — ED Notes (Signed)
EDP at bedside to evaluate pt. Verbal orders for fluids for ETOH 466
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
## 2018-01-02 ENCOUNTER — Inpatient Hospital Stay (HOSPITAL_COMMUNITY)
Admission: EM | Admit: 2018-01-02 | Discharge: 2018-01-08 | DRG: 897 | Disposition: A | Discharge status: Home / Self Care | Payer: Medicaid Other | Attending: Internal Medicine | Admitting: Internal Medicine

## 2018-01-02 ENCOUNTER — Other Ambulatory Visit: Payer: Self-pay

## 2018-01-02 ENCOUNTER — Ambulatory Visit (HOSPITAL_COMMUNITY)
Admission: RE | Admit: 2018-01-02 | Discharge: 2018-01-02 | Disposition: A | Discharge status: Home / Self Care | Payer: Self-pay | Attending: Psychiatry | Admitting: Psychiatry

## 2018-01-02 ENCOUNTER — Inpatient Hospital Stay (HOSPITAL_COMMUNITY): Payer: Self-pay

## 2018-01-02 ENCOUNTER — Encounter (HOSPITAL_COMMUNITY): Payer: Self-pay

## 2018-01-02 DIAGNOSIS — K7010 Alcoholic hepatitis without ascites: Secondary | ICD-10-CM | POA: Diagnosis present

## 2018-01-02 DIAGNOSIS — K7031 Alcoholic cirrhosis of liver with ascites: Secondary | ICD-10-CM | POA: Diagnosis present

## 2018-01-02 DIAGNOSIS — F329 Major depressive disorder, single episode, unspecified: Secondary | ICD-10-CM | POA: Insufficient documentation

## 2018-01-02 DIAGNOSIS — D72819 Decreased white blood cell count, unspecified: Secondary | ICD-10-CM | POA: Diagnosis not present

## 2018-01-02 DIAGNOSIS — F17200 Nicotine dependence, unspecified, uncomplicated: Secondary | ICD-10-CM | POA: Diagnosis present

## 2018-01-02 DIAGNOSIS — F1014 Alcohol abuse with alcohol-induced mood disorder: Secondary | ICD-10-CM | POA: Diagnosis present

## 2018-01-02 DIAGNOSIS — D6959 Other secondary thrombocytopenia: Secondary | ICD-10-CM | POA: Diagnosis present

## 2018-01-02 DIAGNOSIS — D638 Anemia in other chronic diseases classified elsewhere: Secondary | ICD-10-CM | POA: Diagnosis present

## 2018-01-02 DIAGNOSIS — Z0289 Encounter for other administrative examinations: Secondary | ICD-10-CM

## 2018-01-02 DIAGNOSIS — Z008 Encounter for other general examination: Secondary | ICD-10-CM

## 2018-01-02 DIAGNOSIS — I8510 Secondary esophageal varices without bleeding: Secondary | ICD-10-CM | POA: Diagnosis present

## 2018-01-02 DIAGNOSIS — K766 Portal hypertension: Secondary | ICD-10-CM | POA: Diagnosis not present

## 2018-01-02 DIAGNOSIS — F209 Schizophrenia, unspecified: Secondary | ICD-10-CM | POA: Diagnosis present

## 2018-01-02 DIAGNOSIS — Z9114 Patient's other noncompliance with medication regimen: Secondary | ICD-10-CM

## 2018-01-02 DIAGNOSIS — F1024 Alcohol dependence with alcohol-induced mood disorder: Principal | ICD-10-CM | POA: Diagnosis present

## 2018-01-02 DIAGNOSIS — D696 Thrombocytopenia, unspecified: Secondary | ICD-10-CM | POA: Diagnosis present

## 2018-01-02 DIAGNOSIS — K219 Gastro-esophageal reflux disease without esophagitis: Secondary | ICD-10-CM | POA: Diagnosis present

## 2018-01-02 DIAGNOSIS — R443 Hallucinations, unspecified: Secondary | ICD-10-CM

## 2018-01-02 DIAGNOSIS — F319 Bipolar disorder, unspecified: Secondary | ICD-10-CM | POA: Diagnosis present

## 2018-01-02 DIAGNOSIS — F259 Schizoaffective disorder, unspecified: Secondary | ICD-10-CM | POA: Insufficient documentation

## 2018-01-02 DIAGNOSIS — R161 Splenomegaly, not elsewhere classified: Secondary | ICD-10-CM

## 2018-01-02 DIAGNOSIS — T50905A Adverse effect of unspecified drugs, medicaments and biological substances, initial encounter: Secondary | ICD-10-CM

## 2018-01-02 DIAGNOSIS — F419 Anxiety disorder, unspecified: Secondary | ICD-10-CM | POA: Diagnosis present

## 2018-01-02 DIAGNOSIS — R188 Other ascites: Secondary | ICD-10-CM

## 2018-01-02 DIAGNOSIS — R45851 Suicidal ideations: Secondary | ICD-10-CM | POA: Insufficient documentation

## 2018-01-02 DIAGNOSIS — Y908 Blood alcohol level of 240 mg/100 ml or more: Secondary | ICD-10-CM | POA: Diagnosis present

## 2018-01-02 DIAGNOSIS — R51 Headache: Secondary | ICD-10-CM | POA: Diagnosis not present

## 2018-01-02 DIAGNOSIS — F10239 Alcohol dependence with withdrawal, unspecified: Secondary | ICD-10-CM | POA: Diagnosis present

## 2018-01-02 DIAGNOSIS — F1010 Alcohol abuse, uncomplicated: Secondary | ICD-10-CM | POA: Diagnosis present

## 2018-01-02 DIAGNOSIS — F22 Delusional disorders: Secondary | ICD-10-CM

## 2018-01-02 DIAGNOSIS — Z79899 Other long term (current) drug therapy: Secondary | ICD-10-CM

## 2018-01-02 DIAGNOSIS — R402412 Glasgow coma scale score 13-15, at arrival to emergency department: Secondary | ICD-10-CM | POA: Diagnosis present

## 2018-01-02 DIAGNOSIS — K7030 Alcoholic cirrhosis of liver without ascites: Secondary | ICD-10-CM | POA: Diagnosis present

## 2018-01-02 DIAGNOSIS — G43909 Migraine, unspecified, not intractable, without status migrainosus: Secondary | ICD-10-CM | POA: Diagnosis present

## 2018-01-02 DIAGNOSIS — K625 Hemorrhage of anus and rectum: Secondary | ICD-10-CM | POA: Diagnosis present

## 2018-01-02 DIAGNOSIS — D61818 Other pancytopenia: Secondary | ICD-10-CM | POA: Diagnosis present

## 2018-01-02 DIAGNOSIS — K5900 Constipation, unspecified: Secondary | ICD-10-CM | POA: Diagnosis present

## 2018-01-02 DIAGNOSIS — F10229 Alcohol dependence with intoxication, unspecified: Secondary | ICD-10-CM | POA: Diagnosis present

## 2018-01-02 DIAGNOSIS — K760 Fatty (change of) liver, not elsewhere classified: Secondary | ICD-10-CM | POA: Diagnosis present

## 2018-01-02 DIAGNOSIS — G40909 Epilepsy, unspecified, not intractable, without status epilepticus: Secondary | ICD-10-CM | POA: Diagnosis present

## 2018-01-02 DIAGNOSIS — Z811 Family history of alcohol abuse and dependence: Secondary | ICD-10-CM

## 2018-01-02 DIAGNOSIS — D649 Anemia, unspecified: Secondary | ICD-10-CM

## 2018-01-02 DIAGNOSIS — E876 Hypokalemia: Secondary | ICD-10-CM | POA: Diagnosis present

## 2018-01-02 LAB — COMPREHENSIVE METABOLIC PANEL WITH GFR
ALT: 34 U/L (ref 14–54)
AST: 95 U/L — ABNORMAL HIGH (ref 15–41)
Albumin: 3.2 g/dL — ABNORMAL LOW (ref 3.5–5.0)
Alkaline Phosphatase: 196 U/L — ABNORMAL HIGH (ref 38–126)
Anion gap: 15 (ref 5–15)
BUN: <5 mg/dL — ABNORMAL LOW (ref 6–20)
CO2: 18 mmol/L — ABNORMAL LOW (ref 22–32)
Calcium: 8.5 mg/dL — ABNORMAL LOW (ref 8.9–10.3)
Chloride: 107 mmol/L (ref 101–111)
Creatinine, Ser: 0.39 mg/dL — ABNORMAL LOW (ref 0.44–1.00)
GFR calc Af Amer: >60 mL/min
GFR calc non Af Amer: >60 mL/min
Glucose, Bld: 141 mg/dL — ABNORMAL HIGH (ref 65–99)
Potassium: 3.3 mmol/L — ABNORMAL LOW (ref 3.5–5.1)
Sodium: 140 mmol/L (ref 135–145)
Total Bilirubin: 2 mg/dL — ABNORMAL HIGH (ref 0.3–1.2)
Total Protein: 8 g/dL (ref 6.5–8.1)

## 2018-01-02 LAB — CBC
HCT: 35.3 % — ABNORMAL LOW (ref 36.0–46.0)
HCT: 33.4 % — ABNORMAL LOW (ref 36.0–46.0)
Hemoglobin: 11.6 g/dL — ABNORMAL LOW (ref 12.0–15.0)
Hemoglobin: 10.9 g/dL — ABNORMAL LOW (ref 12.0–15.0)
MCH: 29.7 pg (ref 26.0–34.0)
MCH: 29.2 pg (ref 26.0–34.0)
MCHC: 32.9 g/dL (ref 30.0–36.0)
MCHC: 32.6 g/dL (ref 30.0–36.0)
MCV: 90.5 fL (ref 78.0–100.0)
MCV: 89.5 fL (ref 78.0–100.0)
Platelets: <5 10*3/uL — CL (ref 150–400)
Platelets: 7 10*3/uL — CL (ref 150–400)
RBC: 3.9 MIL/uL (ref 3.87–5.11)
RBC: 3.73 MIL/uL — ABNORMAL LOW (ref 3.87–5.11)
RDW: 19.5 % — ABNORMAL HIGH (ref 11.5–15.5)
RDW: 19.7 % — ABNORMAL HIGH (ref 11.5–15.5)
WBC: 1.9 10*3/uL — ABNORMAL LOW (ref 4.0–10.5)
WBC: 1.9 10*3/uL — ABNORMAL LOW (ref 4.0–10.5)

## 2018-01-02 LAB — ETHANOL: Alcohol, Ethyl (B): 397 mg/dL (ref <10)

## 2018-01-02 LAB — PROTIME-INR
INR: 1.14
Prothrombin Time: 14.5 seconds (ref 11.4–15.2)

## 2018-01-02 LAB — POC OCCULT BLOOD, ED: Fecal Occult Bld: POSITIVE — AB

## 2018-01-02 LAB — I-STAT BETA HCG BLOOD, ED (MC, WL, AP ONLY): I-stat hCG, quantitative: <5 m[IU]/mL (ref <5)

## 2018-01-02 LAB — ABO/RH: ABO/RH(D): O POS

## 2018-01-02 LAB — ACETAMINOPHEN LEVEL: Acetaminophen (Tylenol), Serum: <10 ug/mL — ABNORMAL LOW (ref 10–30)

## 2018-01-02 LAB — SALICYLATE LEVEL: Salicylate Lvl: <7 mg/dL (ref 2.8–30.0)

## 2018-01-02 MED ORDER — BUSPIRONE HCL 5 MG PO TABS
7.5000 mg | ORAL_TABLET | Freq: Two times a day (BID) | ORAL | Status: DC
  Filled 2018-01-02: qty 1.5

## 2018-01-02 MED ORDER — LORAZEPAM 2 MG/ML IJ SOLN: 0.0000 mg | Freq: Two times a day (BID) | INTRAMUSCULAR | Status: DC

## 2018-01-02 MED ORDER — ACETAMINOPHEN 650 MG RE SUPP: 650.0000 mg | Freq: Four times a day (QID) | RECTAL | Status: DC | PRN

## 2018-01-02 MED ORDER — THIAMINE HCL 100 MG/ML IJ SOLN: 100.0000 mg | Freq: Every day | INTRAMUSCULAR | Status: DC

## 2018-01-02 MED ORDER — POTASSIUM CHLORIDE CRYS ER 20 MEQ PO TBCR
40.0000 meq | EXTENDED_RELEASE_TABLET | Freq: Once | ORAL | Status: AC
  Administered 2018-01-03: 40 meq via ORAL
  Filled 2018-01-02: qty 2

## 2018-01-02 MED ORDER — FUROSEMIDE 40 MG PO TABS: 20.0000 mg | ORAL_TABLET | Freq: Every day | ORAL | Status: DC

## 2018-01-02 MED ORDER — HALOPERIDOL LACTATE 2 MG/ML PO CONC
2.0000 mg | Freq: Every day | ORAL | Status: DC
  Administered 2018-01-03 – 2018-01-07 (×6): 2 mg via ORAL
  Filled 2018-01-02 (×8): qty 1

## 2018-01-02 MED ORDER — SODIUM CHLORIDE 0.9 % IV SOLN: 2.0000 g | INTRAVENOUS | Status: DC

## 2018-01-02 MED ORDER — ONDANSETRON HCL 4 MG/2ML IJ SOLN
4.0000 mg | Freq: Four times a day (QID) | INTRAMUSCULAR | Status: DC | PRN
  Administered 2018-01-05 – 2018-01-07 (×2): 4 mg via INTRAVENOUS
  Filled 2018-01-02 (×2): qty 2

## 2018-01-02 MED ORDER — HYDROXYZINE HCL 25 MG PO TABS: 25.0000 mg | ORAL_TABLET | Freq: Three times a day (TID) | ORAL | Status: DC | PRN

## 2018-01-02 MED ORDER — SODIUM CHLORIDE 0.9 % IV SOLN
1.0000 g | INTRAVENOUS | Status: DC
  Administered 2018-01-03: 1 g via INTRAVENOUS
  Filled 2018-01-02: qty 1

## 2018-01-02 MED ORDER — TOPIRAMATE 25 MG PO TABS
50.0000 mg | ORAL_TABLET | Freq: Every day | ORAL | Status: DC
Start: 2018-01-02 — End: 2018-01-08
  Administered 2018-01-03 – 2018-01-08 (×7): 50 mg via ORAL
  Filled 2018-01-02 (×7): qty 2

## 2018-01-02 MED ORDER — LORAZEPAM 2 MG/ML IJ SOLN: 0.0000 mg | Freq: Four times a day (QID) | INTRAMUSCULAR | Status: DC

## 2018-01-02 MED ORDER — TRAZODONE HCL 100 MG PO TABS: 100.0000 mg | ORAL_TABLET | Freq: Every evening | ORAL | Status: DC | PRN

## 2018-01-02 MED ORDER — FLUOXETINE HCL 20 MG PO CAPS: 20.0000 mg | ORAL_CAPSULE | Freq: Every day | ORAL | Status: DC

## 2018-01-02 MED ORDER — ONDANSETRON HCL 4 MG PO TABS: 4.0000 mg | ORAL_TABLET | Freq: Four times a day (QID) | ORAL | Status: DC | PRN

## 2018-01-02 MED ORDER — FOLIC ACID 1 MG PO TABS
1.0000 mg | ORAL_TABLET | Freq: Every day | ORAL | Status: DC
  Administered 2018-01-03 – 2018-01-08 (×6): 1 mg via ORAL
  Filled 2018-01-02 (×6): qty 1

## 2018-01-02 MED ORDER — ADULT MULTIVITAMIN W/MINERALS CH
1.0000 | ORAL_TABLET | Freq: Every day | ORAL | Status: DC
  Administered 2018-01-03 – 2018-01-08 (×6): 1 via ORAL
  Filled 2018-01-02 (×6): qty 1

## 2018-01-02 MED ORDER — ALUM & MAG HYDROXIDE-SIMETH 200-200-20 MG/5ML PO SUSP: 30.0000 mL | Freq: Four times a day (QID) | ORAL | Status: DC | PRN

## 2018-01-02 MED ORDER — FAMOTIDINE 20 MG PO TABS: 20.0000 mg | ORAL_TABLET | Freq: Two times a day (BID) | ORAL | Status: DC | PRN

## 2018-01-02 MED ORDER — LORAZEPAM 1 MG PO TABS: 0.0000 mg | ORAL_TABLET | Freq: Four times a day (QID) | ORAL | Status: DC

## 2018-01-02 MED ORDER — ONDANSETRON HCL 4 MG PO TABS: 4.0000 mg | ORAL_TABLET | Freq: Three times a day (TID) | ORAL | Status: DC | PRN

## 2018-01-02 MED ORDER — VITAMIN B-1 100 MG PO TABS: 100.0000 mg | ORAL_TABLET | Freq: Every day | ORAL | Status: DC

## 2018-01-02 MED ORDER — LORAZEPAM 2 MG/ML IJ SOLN
0.0000 mg | Freq: Two times a day (BID) | INTRAMUSCULAR | Status: AC
  Administered 2018-01-05 – 2018-01-06 (×4): 2 mg via INTRAVENOUS
  Filled 2018-01-02 (×5): qty 1

## 2018-01-02 MED ORDER — ZOLPIDEM TARTRATE 5 MG PO TABS: 5.0000 mg | ORAL_TABLET | Freq: Every evening | ORAL | Status: DC | PRN

## 2018-01-02 MED ORDER — TOPIRAMATE 25 MG PO TABS: 50.0000 mg | ORAL_TABLET | Freq: Every day | ORAL | Status: DC

## 2018-01-02 MED ORDER — VITAMIN B-1 100 MG PO TABS
100.0000 mg | ORAL_TABLET | Freq: Every day | ORAL | Status: DC
  Administered 2018-01-03 – 2018-01-08 (×6): 100 mg via ORAL
  Filled 2018-01-02 (×6): qty 1

## 2018-01-02 MED ORDER — LORAZEPAM 1 MG PO TABS: 0.0000 mg | ORAL_TABLET | Freq: Two times a day (BID) | ORAL | Status: DC

## 2018-01-02 MED ORDER — POTASSIUM CHLORIDE CRYS ER 20 MEQ PO TBCR: 40.0000 meq | EXTENDED_RELEASE_TABLET | Freq: Once | ORAL | Status: DC

## 2018-01-02 MED ORDER — SODIUM CHLORIDE 0.9 % IV SOLN
10.0000 mL/h | Freq: Once | INTRAVENOUS | Status: AC
  Administered 2018-01-03: 10 mL/h via INTRAVENOUS

## 2018-01-02 MED ORDER — LORAZEPAM 2 MG/ML IJ SOLN
0.0000 mg | Freq: Four times a day (QID) | INTRAMUSCULAR | Status: AC
  Administered 2018-01-03 – 2018-01-04 (×6): 2 mg via INTRAVENOUS
  Filled 2018-01-02 (×7): qty 1

## 2018-01-02 MED ORDER — ACETAMINOPHEN 325 MG PO TABS: 650.0000 mg | ORAL_TABLET | Freq: Four times a day (QID) | ORAL | Status: DC | PRN

## 2018-01-02 MED ORDER — PENTOXIFYLLINE ER 400 MG PO TBCR: 400.0000 mg | EXTENDED_RELEASE_TABLET | Freq: Three times a day (TID) | ORAL | Status: DC

## 2018-01-02 MED ORDER — LORAZEPAM 2 MG/ML IJ SOLN
1.0000 mg | Freq: Four times a day (QID) | INTRAMUSCULAR | Status: AC | PRN
  Administered 2018-01-03 – 2018-01-05 (×2): 1 mg via INTRAVENOUS

## 2018-01-02 MED ORDER — HALOPERIDOL 2 MG PO TABS
2.0000 mg | ORAL_TABLET | Freq: Every day | ORAL | Status: DC
  Filled 2018-01-02: qty 1

## 2018-01-02 MED ORDER — LORAZEPAM 1 MG PO TABS
1.0000 mg | ORAL_TABLET | Freq: Four times a day (QID) | ORAL | Status: AC | PRN
  Administered 2018-01-03 (×2): 1 mg via ORAL
  Filled 2018-01-02 (×2): qty 1

## 2018-01-02 NOTE — Progress Notes (Signed)
Patient was admitted to room 1430 with sitter Shawn (NT). Patient was very anxious upon admission.
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
## 2018-01-02 NOTE — ED Notes (Signed)
Patient unable to give a urine sample
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
## 2018-01-02 NOTE — ED Notes (Signed)
ED TO INPATIENT HANDOFF REPORT  Name/Age/Gender Briana Sweeney 47 y.o. female  Code Status    Code Status Orders  (From admission, onward)        Start     Ordered   01/02/18 2128  Full code  Continuous     01/02/18 2130    Code Status History    Date Active Date Inactive Code Status Order ID Comments User Context   01/02/2018 2057 01/02/2018 2102 Full Code [id number]  Street, Lane, [place] ED   10/28/2017 1552 11/02/2017 1840 Full Code [id number]  Vicenta Aly, NP Inpatient   10/09/2017 0038 10/14/2017 1745 Full Code [id number]  Vianne Bulls, MD ED   09/11/2017 2234 09/16/2017 1711 Full Code [id number]  Karmen Bongo, MD Inpatient   09/01/2017 0159 09/04/2017 1436 Full Code [id number]  Vilma Prader, MD Inpatient   07/19/2017 2125 07/28/2017 2031 Full Code [id number]  Vilma Prader, MD ED      Home/SNF/Other Home  Chief Complaint Medical clearance   Level of Care/Admitting Diagnosis ED Disposition    ED Disposition Condition [facility] Area: [facility] [[id number]]  Level of Care: Telemetry [5]  Admit to tele based on following criteria: Monitor for Ischemic changes  Diagnosis: Thrombocytopenia [facility]) [[id number]]  Admitting Physician: Rise Patience [phone]  Attending Physician: Rise Patience [phone]  Estimated length of stay: past midnight tomorrow  Certification:: I certify this patient will need inpatient services for at least 2 midnights  PT Class (Do Not Modify): Inpatient [101]  PT Acc Code (Do Not Modify): Private [1]       Medical History Past Medical History:  Diagnosis Date  . Anxiety   . Bipolar affective disorder ([place])    With anxiety features  . Cirrhosis of liver ([place])    Due to alcohol and hepatitis C  . Depression   . GERD (gastroesophageal reflux disease)   . Hepatitis C 2018   hepatitis c and alcohol related hepatitis  . History of blood transfusion    "blood doesn't clot; I fell down and had  to have a transfusion"  . History of kidney stones   . Migraine    "when I get really stressed" (09/01/2017)  . Schizophrenia ([place])   . Seizures ([place])    "when I run out of my RX; lots recently" (09/01/2017)    Allergies No Known Allergies  IV Location/Drains/Wounds Patient Lines/Drains/Airways Status   Active Line/Drains/Airways    Name:   Placement date:   Placement time:   Site:   Days:   Peripheral IV 01/01/18 Left Hand   01/01/18    0325    Hand   1          Labs/Imaging Results for orders placed or performed during the hospital encounter of 01/02/18 (from the past 48 hour(s))  Comprehensive metabolic panel     Status: Abnormal   Collection Time: 01/02/18  7:35 PM  Result Value Ref Range   Sodium 140 135 - 145 mmol/L   Potassium 3.3 (L) 3.5 - 5.1 mmol/L   Chloride 107 101 - 111 mmol/L   CO2 18 (L) 22 - 32 mmol/L   Glucose, Bld 141 (H) 65 - 99 mg/dL   BUN <5 (L) 6 - 20 mg/dL   Creatinine, Ser 0.39 (L) 0.44 - 1.00 mg/dL   Calcium 8.5 (L) 8.9 - 10.3 mg/dL   Total Protein 8.0 6.5 - 8.1 g/dL   Albumin 3.2 (L)  3.5 - 5.0 g/dL   AST 95 (H) 15 - 41 U/L   ALT 34 14 - 54 U/L   Alkaline Phosphatase 196 (H) 38 - 126 U/L   Total Bilirubin 2.0 (H) 0.3 - 1.2 mg/dL   GFR calc non Af Amer >60 >60 mL/min   GFR calc Af Amer >60 >60 mL/min    Comment: (NOTE) The eGFR has been calculated using the CKD EPI equation. This calculation has not been validated in all clinical situations. eGFR's persistently <60 mL/min signify possible Chronic Kidney Disease.    Anion gap 15 5 - 15    Comment: Performed at [facility], [place] [address], [place], [place] [address]  Ethanol     Status: Abnormal   Collection Time: 01/02/18  7:35 PM  Result Value Ref Range   Alcohol, Ethyl (B) 397 (HH) <10 mg/dL    Comment:        LOWEST DETECTABLE LIMIT FOR SERUM ALCOHOL IS 10 mg/dL FOR MEDICAL PURPOSES ONLY CRITICAL RESULT CALLED TO, READ BACK BY AND VERIFIED WITH: TALKINGTON,J AT  2050 ON 01/02/2018 BY MOSLEY,J Performed at [facility], [place] [address] [place], [place] [address]   Salicylate level     Status: None   Collection Time: 01/02/18  7:35 PM  Result Value Ref Range   Salicylate Lvl <7.3 2.8 - 30.0 mg/dL    Comment: Performed at [facility], [place] [address], [place], [place] [address]  Acetaminophen level     Status: Abnormal   Collection Time: 01/02/18  7:35 PM  Result Value Ref Range   Acetaminophen (Tylenol), Serum <10 (L) 10 - 30 ug/mL    Comment:        THERAPEUTIC CONCENTRATIONS VARY SIGNIFICANTLY. A RANGE OF 10-30 ug/mL MAY BE AN EFFECTIVE CONCENTRATION FOR MANY PATIENTS. HOWEVER, SOME ARE BEST TREATED AT CONCENTRATIONS OUTSIDE THIS RANGE. ACETAMINOPHEN CONCENTRATIONS >150 ug/mL AT 4 HOURS AFTER INGESTION AND >50 ug/mL AT 12 HOURS AFTER INGESTION ARE OFTEN ASSOCIATED WITH TOXIC REACTIONS. Performed at [facility], [place] [address] [place], [place] [address]   cbc     Status: Abnormal   Collection Time: 01/02/18  7:35 PM  Result Value Ref Range   WBC 1.9 (L) 4.0 - 10.5 K/uL   RBC 3.90 3.87 - 5.11 MIL/uL   Hemoglobin 11.6 (L) 12.0 - 15.0 g/dL   HCT 35.3 (L) 36.0 - 46.0 %   MCV 90.5 78.0 - 100.0 fL   MCH 29.7 26.0 - 34.0 pg   MCHC 32.9 30.0 - 36.0 g/dL   RDW 19.5 (H) 11.5 - 15.5 %   Platelets <5 (LL) 150 - 400 K/uL    Comment: REPEATED TO VERIFY SPECIMEN CHECKED FOR CLOTS PLATELET COUNT CONFIRMED BY SMEAR CRITICAL RESULT CALLED TO, READ BACK BY AND VERIFIED WITH: Annabell Howells [id number] @ 2015 BY J SCOTTON Performed at [facility], [place] [address] [place], [place] [address]   I-Stat beta hCG blood, ED     Status: None   Collection Time: 01/02/18  7:40 PM  Result Value Ref Range   I-stat hCG, quantitative <5.0 <5 mIU/mL   Comment 3            Comment:   GEST. AGE      CONC.  (mIU/mL)   <=1 WEEK        5 - 50     2 WEEKS       50 - 500  3 WEEKS        100 - 10,000     4 WEEKS     1,000 - 30,000        FEMALE AND NON-PREGNANT FEMALE:     LESS THAN 5 mIU/mL   POC occult blood, ED Provider will collect     Status: Abnormal   Collection Time: 01/02/18  9:16 PM  Result Value Ref Range   Fecal Occult Bld POSITIVE (A) NEGATIVE   US Abdomen Complete  Result Date: 01/02/2018 CLINICAL DATA:  Initial evaluation for splenomegaly. History of hypertonicity with cirrhosis. EXAM: ABDOMEN ULTRASOUND COMPLETE COMPARISON:  Prior CT from 07/24/2017. FINDINGS: Gallbladder: 1.6 cm calculus present within the gallbladder lumen. Gallbladder wall measure within normal limits of 1.9 mm. No free pericholecystic fluid. No sonographic Murphy sign elicited on exam. Common bile duct: Diameter: 5.5 mm Liver: Liver demonstrates coarse heterogeneous and increased echotexture with nodular contour, consistent with cirrhosis. No focal intrahepatic lesion. Portal vein is patent on color Doppler imaging with normal direction of blood flow towards the liver. IVC: No abnormality visualized. Pancreas: Visualized portion unremarkable. Spleen: Size and appearance within normal limits, measuring 9.8 cm in craniocaudad dimension. Right Kidney: Length: 11.4 cm. Echogenicity within normal limits. No mass or hydronephrosis visualized. Left Kidney: Length: 13.7 cm. Echogenicity within normal limits. No mass or hydronephrosis visualized. Abdominal aorta: No aneurysm visualized. Other findings: None. IMPRESSION: 1. Spleen size within normal limits by sonography. No evidence for splenomegaly. 2. Hepatic cirrhosis. 3. Cholelithiasis. No sonographic features to suggest acute cholecystitis. No biliary dilatation. Electronically Signed   By: Jeannine Boga M.D.   On: 01/02/2018 22:18    Pending Labs Unresulted Labs (From admission, onward)   Start     Ordered   01/03/18 7001  Basic metabolic panel  Tomorrow morning,   R     01/02/18 2130   01/03/18 0500  CBC WITH DIFFERENTIAL  Tomorrow  morning,   R     01/02/18 2130   01/03/18 0500  Hepatic function panel  Tomorrow morning,   R     01/02/18 2130   01/03/18 0500  Platelet count  Daily,   R    Comments:  ALL platelet counts MUST be collected in a citrate tube!!!!    01/02/18 2230   01/02/18 2228  Vitamin B12  Once,   R     01/02/18 2228   01/02/18 2112  Differential  Add-on,   R     01/02/18 2111   01/02/18 2111  Save smear  Once,   R     01/02/18 2110   01/02/18 2110  Prepare Pheresed Platelets  (Adult Blood Adminstration - Platelets (Pheresed))  Once,   R    Question Answer Comment  Number of Apheresis Units 1 unit (6-10 packs)   Transfusion Indications PLT Count </=10,000/mm   Date and time of surgery n/a      01/02/18 2110   01/02/18 2104  Protime-INR  STAT,   STAT     01/02/18 2103   01/02/18 1859  Rapid urine drug screen (hospital performed)  Once,   STAT     01/02/18 1858      Vitals/Pain Today's Vitals   01/02/18 1854 01/02/18 1855 01/02/18 2032 01/02/18 2236  BP: (!) 129/96   (!) 126/92  Pulse: 91   92  Resp: 18   17  Temp: (!) 97.5 F (36.4 C)   97.7 F (36.5 C)  TempSrc: Oral   Oral  SpO2:  96%   97%  Weight:   130 lb (59 kg)   Height:   _0  (1.499 m)   PainSc:  0-No pain      Isolation Precautions No active isolations  Medications Medications  haloperidol (HALDOL) 2 MG/ML solution 2 mg (has no administration in time range)  potassium chloride SA (K-DUR,KLOR-CON) CR tablet 40 mEq (has no administration in time range)  0.9 %  sodium chloride infusion (has no administration in time range)  acetaminophen (TYLENOL) tablet 650 mg (has no administration in time range)    Or  acetaminophen (TYLENOL) suppository 650 mg (has no administration in time range)  ondansetron (ZOFRAN) tablet 4 mg (has no administration in time range)    Or  ondansetron (ZOFRAN) injection 4 mg (has no administration in time range)  LORazepam (ATIVAN) tablet 1 mg (has no administration in time range)    Or   LORazepam (ATIVAN) injection 1 mg (has no administration in time range)  thiamine (VITAMIN B-1) tablet 100 mg (has no administration in time range)    Or  thiamine (B-1) injection 100 mg (has no administration in time range)  folic acid (FOLVITE) tablet 1 mg (has no administration in time range)  multivitamin with minerals tablet 1 tablet (has no administration in time range)  LORazepam (ATIVAN) injection 0-4 mg (has no administration in time range)    Followed by  LORazepam (ATIVAN) injection 0-4 mg (has no administration in time range)  cefTRIAXone (ROCEPHIN) 1 g in sodium chloride 0.9 % 100 mL IVPB (has no administration in time range)    Mobility walks

## 2018-01-02 NOTE — H&P (Signed)
History and Physical    Briana Sweeney FUX:[id number] DOB: 18-Feb-1971 DOA: 01/02/2018  PCP: Scot Jun, FNP  Patient coming from: Home.  Chief Complaint: Suicidal thoughts.  HPI: Briana Sweeney is a 47 y.o. female with history of alcohol abuse, liver cirrhosis with EGD in December 2018 showing esophageal varices, pancytopenia likely from alcoholism, schizophrenia had presented to the behavioral health with complaints of suicidal thoughts and anxiety.  Patient states one of change neurotransmitters was released from jail and had come to home and was trying to threaten.  Patient states today she had some rectal bleeding.  Denies any vomiting.  Patient states she has not been taking many of her medications last few weeks as she ran out of it.  Patient admits to drinking vodka.  Patient has had an EGD in December 2018 which showed esophageal varices.  ED Course:  In the ER behavioral health was consulted and was planned to be admitted beyond mild and basic labs done shows. Severe thrombocytopenia with unremarkable platelet counts.  Dr. Burney Gauze on-call hematologist was consulted.  At this time hematologist feel patient is constipated low due to alcoholism.  Patient is receiving platelet transfusion and admitted for further observation.  On my exam patient denies any abdominal pain has tremors.  Denies any suicidal thoughts.  Review of Systems: As per HPI, rest all negative.   Past Medical History:  Diagnosis Date  . Anxiety   . Bipolar affective disorder ([place])    With anxiety features  . Cirrhosis of liver ([place])    Due to alcohol and hepatitis C  . Depression   . GERD (gastroesophageal reflux disease)   . Hepatitis C 2018   hepatitis c and alcohol related hepatitis  . History of blood transfusion    "blood doesn't clot; I fell down and had to have a transfusion"  . History of kidney stones   . Migraine    "when I get really stressed" (09/01/2017)  . Schizophrenia ([place])   .  Seizures ([place])    "when I run out of my RX; lots recently" (09/01/2017)    Past Surgical History:  Procedure Laterality Date  . ESOPHAGOGASTRODUODENOSCOPY N/A 09/03/2017   Procedure: ESOPHAGOGASTRODUODENOSCOPY (EGD);  Surgeon: Doran Stabler, MD;  Location: [place];  Service: Gastroenterology;  Laterality: N/A;  . FINGER FRACTURE SURGERY Left    "shattered my pinky"  . FRACTURE SURGERY    . IR PARACENTESIS  07/23/2017  . IR PARACENTESIS  07/2017   "did it twice in the same week" (09/01/2017)  . SHOULDER OPEN ROTATOR CUFF REPAIR Right   . TUBAL LIGATION    . VAGINAL HYSTERECTOMY       reports that she has never smoked. She has never used smokeless tobacco. She reports that she drinks about 16.8 oz of alcohol per week. She reports that she does not use drugs.  No Known Allergies  Family History  Problem Relation Age of Onset  . Lung cancer Mother 60  . Alcohol abuse Mother   . Throat cancer Father 59    Prior to Admission medications   Medication Sig Start Date End Date Taking? Authorizing Provider  busPIRone (BUSPAR) 7.5 MG tablet Take 1 tablet (7.5 mg total) by mouth 2 (two) times daily. For anxiety 11/02/17   Money, Lowry Ram, FNP  famotidine (PEPCID) 20 MG tablet Take 1 tablet (20 mg total) by mouth 2 (two) times daily. Patient taking differently: Take 20 mg by mouth 2 (two) times daily  as needed for heartburn or indigestion.  11/02/17   Money, Lowry Ram, FNP  FLUoxetine (PROZAC) 20 MG capsule Take 1 capsule (20 mg total) by mouth daily. For mood control 11/02/17   Money, Lowry Ram, FNP  furosemide (LASIX) 20 MG tablet Take 1 tablet (20 mg total) by mouth daily. 10/21/17   Scot Jun, FNP  haloperidol (HALDOL) 2 MG tablet Take 2 mg by mouth at bedtime.    [provider]  hydrOXYzine (ATARAX/VISTARIL) 25 MG tablet Take 1 tablet (25 mg total) by mouth 3 (three) times daily as needed for anxiety. 11/02/17   Money, Lowry Ram, FNP  pentoxifylline (TRENTAL) 400 MG  CR tablet Take 1 tablet (400 mg total) by mouth 3 (three) times daily with meals. 11/02/17   Money, Lowry Ram, FNP  spironolactone (ALDACTONE) 50 MG tablet Take 1 tablet (50 mg total) by mouth daily. Patient not taking: Reported on 01/01/2018 10/21/17   Scot Jun, FNP  topiramate (TOPAMAX) 50 MG tablet Take 1 tablet (50 mg total) by mouth daily. 10/21/17   Scot Jun, FNP  traZODone (DESYREL) 100 MG tablet Take 1 tablet (100 mg total) by mouth at bedtime as needed for sleep. 11/02/17   Money, Lowry Ram, FNP    Physical Exam: Vitals:   01/02/18 1854 01/02/18 2032  BP: (!) 129/96   Pulse: 91   Resp: 18   Temp: (!) 97.5 F (36.4 C)   TempSrc: Oral   SpO2: 96%   Weight:  59 kg (130 lb)  Height:  4\' 11"  (1.499 m)      Constitutional: Moderately built and nourished. Vitals:   01/02/18 1854 01/02/18 2032  BP: (!) 129/96   Pulse: 91   Resp: 18   Temp: (!) 97.5 F (36.4 C)   TempSrc: Oral   SpO2: 96%   Weight:  59 kg (130 lb)  Height:  4\' 11"  (1.499 m)   Eyes: Anicteric mild pallor. ENMT: No discharge from the ears eyes nose or mouth. Neck: No mass felt.  No neck rigidity. Respiratory: No rhonchi or crepitations. Cardiovascular: S1-S2 heard no murmurs appreciated. Abdomen: Soft nontender bowel sounds present. Musculoskeletal: No edema.  No joint effusion. Skin: No rash.  Skin appears warm. Neurologic: Alert awake oriented to time place and person.  Moves all extremities. Psychiatric: Patient has suicidal thoughts.   Labs on Admission: I have personally reviewed following labs and imaging studies  CBC: Recent Labs  Lab 01/01/18 0355 01/02/18 1935  WBC 3.1* 1.9*  HGB 11.9* 11.6*  HCT 37.6 35.3*  MCV 91.9 90.5  PLT 13* <5*   Basic Metabolic Panel: Recent Labs  Lab 01/01/18 0355 01/02/18 1935  NA 143 140  K 3.3* 3.3*  CL 111 107  CO2 21* 18*  GLUCOSE 111* 141*  BUN 5* <5*  CREATININE 0.60 0.39*  CALCIUM 8.0* 8.5*  MG 1.7  --    GFR: Estimated  Creatinine Clearance: 69.4 mL/min (A) (by C-G formula based on SCr of 0.39 mg/dL (L)). Liver Function Tests: Recent Labs  Lab 01/01/18 0355 01/02/18 1935  AST 99* 95*  ALT 41 34  ALKPHOS 247* 196*  BILITOT 1.4* 2.0*  PROT 8.7* 8.0  ALBUMIN 3.2* 3.2*   No results for input(s): LIPASE, AMYLASE in the last 168 hours. No results for input(s): AMMONIA in the last 168 hours. Coagulation Profile: No results for input(s): INR, PROTIME in the last 168 hours. Cardiac Enzymes: No results for input(s): CKTOTAL, CKMB, CKMBINDEX, TROPONINI in  the last 168 hours. BNP (last 3 results) No results for input(s): PROBNP in the last 8760 hours. HbA1C: No results for input(s): HGBA1C in the last 72 hours. CBG: No results for input(s): GLUCAP in the last 168 hours. Lipid Profile: No results for input(s): CHOL, HDL, LDLCALC, TRIG, CHOLHDL, LDLDIRECT in the last 72 hours. Thyroid Function Tests: No results for input(s): TSH, T4TOTAL, FREET4, T3FREE, THYROIDAB in the last 72 hours. Anemia Panel: No results for input(s): VITAMINB12, FOLATE, FERRITIN, TIBC, IRON, RETICCTPCT in the last 72 hours. Urine analysis:    Component Value Date/Time   COLORURINE YELLOW 10/22/2017 2120   APPEARANCEUR HAZY (A) 10/22/2017 2120   LABSPEC 1.010 12/02/2017 0836   PHURINE 7.0 12/02/2017 0836   GLUCOSEU NEGATIVE 12/02/2017 0836   HGBUR NEGATIVE 12/02/2017 0836   BILIRUBINUR NEGATIVE 12/02/2017 0836   KETONESUR NEGATIVE 12/02/2017 0836   PROTEINUR NEGATIVE 12/02/2017 0836   UROBILINOGEN 0.2 12/02/2017 0836   NITRITE NEGATIVE 12/02/2017 0836   LEUKOCYTESUR NEGATIVE 12/02/2017 0836   Sepsis Labs: @LABRCNTIP (procalcitonin:4,lacticidven:4) )No results found for this or any previous visit (from the past 240 hour(s)).   Radiological Exams on Admission: No results found.    Assessment/Plan Principal Problem:   Thrombocytopenia ([place]) Active Problems:   Pancytopenia (H[facility])   Alcoholic cirrhosis of liver with  ascites (H[facility])   Alcohol abuse with alcohol-induced mood disorder ([place])   Schizophrenia ([place])    1. Pancytopenia with severe thrombocytopenia -discussed with Dr. Burney Gauze on-call hematologist who further patient's thrombocytopenia likely from alcoholism.  Smear did not show any concerning features.  Platelet transfusion has been ordered follow CBC.  Hold Trental. 2. Alcohol withdrawal -patient has tremors.  Placed on CIWA protocol. 3. Suicidal ideation with history of schizophrenia -please reconsult psychiatry in a.m.  Placed on suicide precaution.  We will continue Haldol not sure if patient has been taking other medications. 4. Rectal bleeding -likely from severe thrombocytopenia.  Closely follow CBC.  Patient is placed on empiric antibiotics given history of cirrhosis.  Patient is presently hemodynamically stable.  Denies any vomiting or blood. 5. History of liver cirrhosis with esophageal varices in spite of the EGD done and December 2018 -patient was discharged in February with Lasix, spironolactone and Trental.  Not sure if patient had been taking these.  Trental will be held due to thrombocytopenia.   DVT prophylaxis: SCDs. Code Status: Full code current Family Communication: No family at the bedside. Disposition Plan: To be determined. Consults called: Hematologist. Admission status: Inpatient.   Rise Patience MD [facility] Pager [phone].  If 7PM-7AM, please contact night-coverage [email or web address] Password [facility]  01/02/2018, 9:33 PM

## 2018-01-02 NOTE — ED Notes (Signed)
Pt is undressed, put all of her belongings in a bag and jewelry .[name] Kitchen Also patient has a ring that isn't able to come off it has been on her finger for a while now and she states it hurts when she tries to take it off, so the ring is on the left hand on the wedding ring.

## 2018-01-02 NOTE — Progress Notes (Signed)
Pharmacy Consult for Rocephin for SBP px and Dilantin. D/w Dr. Hal Hope - pt not currently taking dilantin. Was discontinued in Feb 2019 per discharge notes from [facility] admission ending 11/02/17.  She is on topamax and Dr. Hal Hope wants to continue this.  Plan: Rocephin 1 gm IV q24 for SBP px Resume home topamax 50 mg po qday Pharmacy to sign off Thanks Eudelia Bunch, Pharm.D. [phone] 01/02/2018 10:47 PM

## 2018-01-02 NOTE — ED Provider Notes (Signed)
Patient presents for "medical clearance" feeling suicidal.  She is been drinking alcohol regularly and states she has had blood per rectum for the past 3 days.  She denies pain anywhere.  On exam alert Glasgow Coma Score 15 nontoxic cranial nerves II through XII grossly intact eyes sclerae are icteric.  Neurologic Glasgow Coma Score 15 cranial nerves II through XII intact moves all extremities well.  Rectum normal tone minimal stool on examining finger stool is Hemoccult positive   Orlie Dakin, MD 01/03/18 [phone]

## 2018-01-02 NOTE — ED Notes (Signed)
Bed: WLPT4 Expected date:  Expected time:  Means of arrival:  Comments: 

## 2018-01-02 NOTE — ED Provider Notes (Signed)
[facility] DEPT Provider Note   CSN: [id number] Arrival date & time: 01/02/18  [place]     History   Chief Complaint Chief Complaint  Patient presents with  . Medical Clearance    HPI Briana Sweeney is a 47 y.o. female with a PMHx of bipolar disorder, anxiety, schizophrenia, liver cirrhosis, GERD, HepC, kidney stones, migraines, and seizures, who presents to the ED sent over from [facility] for medical clearance. LEVEL 5 CAVEAT DUE TO INTOXICATION AND PSYCHIATRIC CONDITION. Per [facility] notes: "Briana Sweeney is an 47 y.o. female patient presents as walk in at [facility] with complaints of worsening depression and suicidal ideation.  Patient is tearful and paranoid.  Patient states a female friend that  she has known since the age of 77 escaped [place] jail today and came to her house at the time states she was not at home but a friend of hers was there.  Patient states that our note was live telling her that he was going to tie her care of her dog and everybody else were making her watch.  Please was called in to get was found in and out building behind the house.  Patient reports she has a history of schizoaffective disorder and has not been taking her medications.  Patient is intoxicated and also states that she is hearing voices but unable to tell what the voices are saying." They recommended inpatient tx so they sent her here for medical clearance.   Pt is able to confirm the above information. She is very tearful and gets agitated easily but is calmed down fairly quickly as well.  She endorses SI but denies having a specific plan, states "I will just go to sleep".  Reports auditory hallucinations hearing voices but does not further elaborate.  Also reports visual hallucinations "seeing a big man in my closet".  She admits to drinking alcohol today, states that it was "not a lot" but cannot further elaborate on this either.  She denies illicit drug use, smoking, or HI.  She states  that she took her Haldol last night, Prozac today, BuSpar 2 days ago, and Klonopin today, and states that she is compliant with all of her other medications.  She denies any acute complaints at this time however history is limited due to her intoxication.   The history is provided by the patient and medical records. No language interpreter was used.    Past Medical History:  Diagnosis Date  . Anxiety   . Bipolar affective disorder ([place])    With anxiety features  . Cirrhosis of liver ([place])    Due to alcohol and hepatitis C  . Depression   . GERD (gastroesophageal reflux disease)   . Hepatitis C 2018   hepatitis c and alcohol related hepatitis  . History of blood transfusion    "blood doesn't clot; I fell down and had to have a transfusion"  . History of kidney stones   . Migraine    "when I get really stressed" (09/01/2017)  . Schizophrenia ([place])   . Seizures ([place])    "when I run out of my RX; lots recently" (09/01/2017)    Patient Active Problem List   Diagnosis Date Noted  . GERD (gastroesophageal reflux disease) 11/01/2017  . Trichimoniasis 10/30/2017  . Alcohol dependence ([place]) 10/29/2017  . MDD (major depressive disorder), severe ([place]) 10/28/2017  . Acute hyperactive alcohol withdrawal delirium ([place]) 10/09/2017  . Schizophrenia ([place]) 10/09/2017  . Alcohol abuse with alcohol-induced mood  disorder ([place])   . Alcohol withdrawal ([place]) 09/11/2017  . Esophageal varices without bleeding ([place])   . Hematemesis 09/01/2017  . Ascites due to alcoholic cirrhosis ([place])   . Decompensated hepatic cirrhosis ([place]) 07/23/2017  . Alcohol abuse 07/23/2017  . Hepatitis C antibody positive in blood 07/23/2017  . Hypokalemia 07/23/2017  . Jaundice 07/23/2017  . Coagulopathy ([place]) 07/23/2017  . Hypomagnesemia 07/23/2017  . Pancytopenia ([place]) 07/23/2017  . Alcoholic cirrhosis of liver with ascites ([place]) 07/23/2017    Past Surgical History:  Procedure Laterality Date  .  ESOPHAGOGASTRODUODENOSCOPY N/A 09/03/2017   Procedure: ESOPHAGOGASTRODUODENOSCOPY (EGD);  Surgeon: Doran Stabler, MD;  Location: [place];  Service: Gastroenterology;  Laterality: N/A;  . FINGER FRACTURE SURGERY Left    "shattered my pinky"  . FRACTURE SURGERY    . IR PARACENTESIS  07/23/2017  . IR PARACENTESIS  07/2017   "did it twice in the same week" (09/01/2017)  . SHOULDER OPEN ROTATOR CUFF REPAIR Right   . TUBAL LIGATION    . VAGINAL HYSTERECTOMY       OB History   None      Home Medications    Prior to Admission medications   Medication Sig Start Date End Date Taking? Authorizing Provider  busPIRone (BUSPAR) 7.5 MG tablet Take 1 tablet (7.5 mg total) by mouth 2 (two) times daily. For anxiety 11/02/17   Money, Lowry Ram, FNP  famotidine (PEPCID) 20 MG tablet Take 1 tablet (20 mg total) by mouth 2 (two) times daily. Patient taking differently: Take 20 mg by mouth 2 (two) times daily as needed for heartburn or indigestion.  11/02/17   Money, Lowry Ram, FNP  FLUoxetine (PROZAC) 20 MG capsule Take 1 capsule (20 mg total) by mouth daily. For mood control 11/02/17   Money, Lowry Ram, FNP  furosemide (LASIX) 20 MG tablet Take 1 tablet (20 mg total) by mouth daily. 10/21/17   Scot Jun, FNP  haloperidol (HALDOL) 2 MG tablet Take 2 mg by mouth at bedtime.    [provider]  hydrOXYzine (ATARAX/VISTARIL) 25 MG tablet Take 1 tablet (25 mg total) by mouth 3 (three) times daily as needed for anxiety. 11/02/17   Money, Lowry Ram, FNP  pentoxifylline (TRENTAL) 400 MG CR tablet Take 1 tablet (400 mg total) by mouth 3 (three) times daily with meals. 11/02/17   Money, Lowry Ram, FNP  spironolactone (ALDACTONE) 50 MG tablet Take 1 tablet (50 mg total) by mouth daily. Patient not taking: Reported on 01/01/2018 10/21/17   Scot Jun, FNP  topiramate (TOPAMAX) 50 MG tablet Take 1 tablet (50 mg total) by mouth daily. 10/21/17   Scot Jun, FNP  traZODone (DESYREL) 100  MG tablet Take 1 tablet (100 mg total) by mouth at bedtime as needed for sleep. 11/02/17   Money, Lowry Ram, FNP    Family History Family History  Problem Relation Age of Onset  . Lung cancer Mother 73  . Alcohol abuse Mother   . Throat cancer Father 88    Social History Social History   Tobacco Use  . Smoking status: Never Smoker  . Smokeless tobacco: Never Used  Substance Use Topics  . Alcohol use: Yes    Alcohol/week: 16.8 oz    Types: 28 Cans of beer per week    Comment: weekly "I have cut back"  . Drug use: No     Allergies   Patient has no known allergies.   Review of Systems Review of  Systems  Unable to perform ROS: Psychiatric disorder  Allergic/Immunologic: Positive for immunocompromised state (HepC).  Psychiatric/Behavioral: Positive for hallucinations and suicidal ideas.   LEVEL 5 CAVEAT DUE TO INTOXICATION AND PSYCHIATRIC CONDITION  Physical Exam Updated Vital Signs BP (!) 129/96 (BP Location: Left Arm)   Pulse 91   Temp (!) 97.5 F (36.4 C) (Oral)   Resp 18   LMP  (LMP Unknown)   SpO2 96%   Physical Exam  Constitutional: Vital signs are normal. She appears well-developed and well-nourished.  Non-toxic appearance. No distress.  Afebrile, nontoxic, tearful on exam but in NAD; gets agitated easily but is easily calmed down   HENT:  Head: Normocephalic and atraumatic.  Mouth/Throat: Oropharynx is clear and moist and mucous membranes are normal.  Eyes: Conjunctivae and EOM are normal. Right eye exhibits no discharge. Left eye exhibits no discharge.  Neck: Normal range of motion. Neck supple.  Cardiovascular: Normal rate, regular rhythm, normal heart sounds and intact distal pulses. Exam reveals no gallop and no friction rub.  No murmur heard. Pulmonary/Chest: Effort normal and breath sounds normal. No respiratory distress. She has no decreased breath sounds. She has no wheezes. She has no rhonchi. She has no rales.  Abdominal: Soft. Normal appearance  and bowel sounds are normal. She exhibits no distension. There is no tenderness. There is no rigidity, no rebound, no guarding, no CVA tenderness, no tenderness at McBurney's point and negative Murphy's sign.  Musculoskeletal: Normal range of motion.  Neurological: She is alert. She has normal strength. No sensory deficit.  Intoxicated Gait fairly steady, nonataxic  Skin: Skin is warm, dry and intact. No rash noted.  Psychiatric: Her affect is labile. She is agitated and actively hallucinating. Thought content is paranoid. She exhibits a depressed mood. She expresses suicidal ideation. She expresses no homicidal ideation. She expresses no suicidal plans and no homicidal plans.  Depressed and labile affect, gets agitated easily but is easily calmed down; once calmed, pt is very tearful and fairly pleasant and cooperative. Visibly intoxicated. Endorsing SI without a plan, denies HI, reports AVH but doesn't seem to be responding to internal stimuli.   Nursing note and vitals reviewed.    ED Treatments / Results  Labs (all labs ordered are listed, but only abnormal results are displayed) Labs Reviewed  COMPREHENSIVE METABOLIC PANEL - Abnormal; Notable for the following components:      Result Value   Potassium 3.3 (*)    CO2 18 (*)    Glucose, Bld 141 (*)    BUN <5 (*)    Creatinine, Ser 0.39 (*)    Calcium 8.5 (*)    Albumin 3.2 (*)    AST 95 (*)    Alkaline Phosphatase 196 (*)    Total Bilirubin 2.0 (*)    All other components within normal limits  ETHANOL - Abnormal; Notable for the following components:   Alcohol, Ethyl (B) 397 (*)    All other components within normal limits  ACETAMINOPHEN LEVEL - Abnormal; Notable for the following components:   Acetaminophen (Tylenol), Serum <10 (*)    All other components within normal limits  CBC - Abnormal; Notable for the following components:   WBC 1.9 (*)    Hemoglobin 11.6 (*)    HCT 35.3 (*)    RDW 19.5 (*)    Platelets <5 (*)     All other components within normal limits  SALICYLATE LEVEL  RAPID URINE DRUG SCREEN, HOSP PERFORMED  PROTIME-INR  SAVE SMEAR  DIFFERENTIAL  I-STAT BETA HCG BLOOD, ED ([facility], [facility], [facility] ONLY)  POC OCCULT BLOOD, ED  PREPARE PLATELET PHERESIS    EKG None  Radiology No results found.  Procedures Procedures (including critical care time)  CRITICAL CARE Performed by: Reece Agar   Total critical care time: 45 minutes  Critical care time was exclusive of separately billable procedures and treating other patients.  Critical care was necessary to treat or prevent imminent or life-threatening deterioration.  Critical care was time spent personally by me on the following activities: development of treatment plan with patient and/or surrogate as well as nursing, discussions with consultants, evaluation of patient's response to treatment, examination of patient, obtaining history from patient or surrogate, ordering and performing treatments and interventions, ordering and review of laboratory studies, ordering and review of radiographic studies, pulse oximetry and re-evaluation of patient's condition.   Medications Ordered in ED Medications  haloperidol (HALDOL) 2 MG/ML solution 2 mg (has no administration in time range)  potassium chloride SA (K-DUR,KLOR-CON) CR tablet 40 mEq (has no administration in time range)  0.9 %  sodium chloride infusion (has no administration in time range)     Initial Impression / Assessment and Plan / ED Course  I have reviewed the triage vital signs and the nursing notes.  Pertinent labs & imaging results that were available during my care of the patient were reviewed by me and considered in my medical decision making (see chart for details).     47 y.o. female here from [facility] for med clearance, pt with SI/AVH and paranoid thoughts/behaviors. They recommend IP placement. Pt intoxicated and has labile affect, gets agitated but then easily calmed down and is  then tearful. Physical exam is otherwise benign aside from her being intoxicated and the psychiatric part of things. Will get med clearance labs and reassess.   8:58 PM CBC with chronic anemia and leukopenia, also with platelet count <5 (yesterday it was 13, recently she's had multiple cancelled platelet counts because of low values); will consult hematologist. CMP with K 3.3, will orally replete; bicarb chronically low at 18; AST 95 alk phos 196 Tbili 2.0 which is all chronic and stable. EtOH level 397. Salicylate and acetaminophen levels WNL. BetaHCG neg. UDS pending. Will consult hematology and add-on INR as well. She'll likely need admission. Discussed case with my attending Dr. Winfred Leeds who agrees with plan.   9:05 PM Dr. Marin Olp returning page, thinks it's either ITP vs bone marrow poisoning from EtOH; transfuse 1U platelets, add-on INR and smear as well as differential, and get abd U/S. He will consult on pt while she's here, and would like medical admission.   9:20 PM Dr. Hal Hope of [facility] returning page and will admit. Holding orders to be placed by admitting team. Please see their notes for further documentation of care. I appreciate their help with this pleasant pt's care. Pt stable at time of admission.    Final Clinical Impressions(s) / ED Diagnoses   Final diagnoses:  Suicidal ideation  Paranoid behavior ([place])  Hallucinations  Medical clearance for psychiatric admission  Alcohol abuse  Chronic anemia  Hypokalemia  Thrombocytopenia ([place])  Pancytopenia [facility])    ED Discharge Orders    [address] [place], [place] 01/02/18 2120    Orlie Dakin, MD 01/03/18 [phone]

## 2018-01-02 NOTE — ED Triage Notes (Signed)
Pt states that someone escaped jail to try to get her. She now states that she is going crazy and is traumatized. She doesn't have a plan to hurt herself, but wants to "sleep." Denies drug use. Endorses alcohol. States that she is off her bilpolar medication.

## 2018-01-02 NOTE — H&P (Signed)
Behavioral Health Medical Screening Exam  Briana Sweeney is an 47 y.o. female patient presents as walk in at [facility] with complaints of worsening depression and suicidal ideation.  Patient is tearful and paranoid.  Patient states a female friend that  she has known since the age of 22 escaped [place] jail today and came to her house at the time states she was not at home but a friend of hers was there.  Patient states that our note was live telling her that he was going to tie her care of her dog and everybody else were making her watch.  Please was called in to get was found in and out building behind the house.  Patient reports she has a history of schizoaffective disorder and has not been taking her medications.  Patient is intoxicated and also states that she is hearing voices but unable to tell what the voices are saying.  Total Time spent with patient: 45 minutes  Psychiatric Specialty Exam: Physical Exam  Vitals reviewed. Constitutional: She is oriented to person, place, and time. She appears well-developed.  HENT:  Head: Normocephalic.  Neck: Normal range of motion. Neck supple.  Cardiovascular:  Blood pressure elevated   Respiratory: Effort normal.  Musculoskeletal: Normal range of motion.  Neurological: She is alert and oriented to person, place, and time.    ROS  Blood pressure (!) 150/108, pulse (!) 113, temperature (!) 97.4 F (36.3 C), resp. rate 20, SpO2 99 %.There is no height or weight on file to calculate BMI.  General Appearance: Casual  Eye Contact:  Fair  Speech:  Clear and Coherent  Volume:  Normal  Mood:  Anxious, Depressed, Hopeless and Irritable  Affect:  Depressed and Tearful  Thought Process:  Coherent  Orientation:  Full (Time, Place, and Person)  Thought Content:  Hallucinations: Auditory  Suicidal Thoughts:  Yes.  without intent/plan  Homicidal Thoughts:  No  Memory:  Immediate;   Fair Recent;   Fair Remote;   Fair  Judgement:  Impaired  Insight:   Lacking  Psychomotor Activity:  Decreased  Concentration: Concentration: Fair and Attention Span: Fair  Recall:  [employer] of Knowledge:Fair  Language: Good  Akathisia:  No  Handed:  Right  AIMS (if indicated):     Assets:  Communication Skills Desire for Improvement Housing Social Support  Sleep:       Musculoskeletal: Strength & Muscle Tone: within normal limits Gait & Station: normal Patient leans: N/A  Blood pressure (!) 150/108, pulse (!) 113, temperature (!) 97.4 F (36.3 C), resp. rate 20, SpO2 99 %.  Recommendations:  Inpatient psychiatric treatment; Patient intoxicated; sending to hospital for medical clearance.  Based on my evaluation the patient appears to have an emergency medical condition for which I recommend the patient be transferred to the emergency department for further evaluation.  Kisean Rollo, NP 01/02/2018, 6:23 PM

## 2018-01-03 ENCOUNTER — Inpatient Hospital Stay (HOSPITAL_COMMUNITY): Payer: Self-pay

## 2018-01-03 DIAGNOSIS — R45851 Suicidal ideations: Secondary | ICD-10-CM

## 2018-01-03 DIAGNOSIS — D649 Anemia, unspecified: Secondary | ICD-10-CM | POA: Diagnosis not present

## 2018-01-03 DIAGNOSIS — D696 Thrombocytopenia, unspecified: Secondary | ICD-10-CM | POA: Diagnosis not present

## 2018-01-03 DIAGNOSIS — K7031 Alcoholic cirrhosis of liver with ascites: Secondary | ICD-10-CM

## 2018-01-03 DIAGNOSIS — E876 Hypokalemia: Secondary | ICD-10-CM

## 2018-01-03 DIAGNOSIS — F209 Schizophrenia, unspecified: Secondary | ICD-10-CM

## 2018-01-03 DIAGNOSIS — F1014 Alcohol abuse with alcohol-induced mood disorder: Secondary | ICD-10-CM | POA: Diagnosis not present

## 2018-01-03 LAB — BASIC METABOLIC PANEL
Anion gap: 14 (ref 5–15)
BUN: <5 mg/dL — ABNORMAL LOW (ref 6–20)
CO2: 19 mmol/L — ABNORMAL LOW (ref 22–32)
Calcium: 7.9 mg/dL — ABNORMAL LOW (ref 8.9–10.3)
Chloride: 108 mmol/L (ref 101–111)
Creatinine, Ser: 0.33 mg/dL — ABNORMAL LOW (ref 0.44–1.00)
GFR calc Af Amer: >60 mL/min (ref >60)
GFR calc non Af Amer: >60 mL/min (ref >60)
Glucose, Bld: 86 mg/dL (ref 65–99)
Potassium: 3.6 mmol/L (ref 3.5–5.1)
Sodium: 141 mmol/L (ref 135–145)

## 2018-01-03 LAB — HEPATIC FUNCTION PANEL
ALT: 30 U/L (ref 14–54)
AST: 87 U/L — ABNORMAL HIGH (ref 15–41)
Albumin: 2.8 g/dL — ABNORMAL LOW (ref 3.5–5.0)
Alkaline Phosphatase: 170 U/L — ABNORMAL HIGH (ref 38–126)
Bilirubin, Direct: 0.6 mg/dL — ABNORMAL HIGH (ref 0.1–0.5)
Indirect Bilirubin: 1 mg/dL — ABNORMAL HIGH (ref 0.3–0.9)
Total Bilirubin: 1.6 mg/dL — ABNORMAL HIGH (ref 0.3–1.2)
Total Protein: 7.1 g/dL (ref 6.5–8.1)

## 2018-01-03 LAB — CBC WITH DIFFERENTIAL/PLATELET
Basophils Absolute: 0 10*3/uL (ref 0.0–0.1)
Basophils Relative: 1 %
Eosinophils Absolute: 0.1 10*3/uL (ref 0.0–0.7)
Eosinophils Relative: 2 %
HCT: 30.6 % — ABNORMAL LOW (ref 36.0–46.0)
Hemoglobin: 9.9 g/dL — ABNORMAL LOW (ref 12.0–15.0)
Lymphocytes Relative: 41 %
Lymphs Abs: 0.9 10*3/uL (ref 0.7–4.0)
MCH: 29.4 pg (ref 26.0–34.0)
MCHC: 32.4 g/dL (ref 30.0–36.0)
MCV: 90.8 fL (ref 78.0–100.0)
Monocytes Absolute: 0.2 10*3/uL (ref 0.1–1.0)
Monocytes Relative: 10 %
Neutro Abs: 1 10*3/uL — ABNORMAL LOW (ref 1.7–7.7)
Neutrophils Relative %: 46 %
Platelets: 19 10*3/uL — CL (ref 150–400)
RBC: 3.37 MIL/uL — ABNORMAL LOW (ref 3.87–5.11)
RDW: 19.7 % — ABNORMAL HIGH (ref 11.5–15.5)
WBC: 2.3 10*3/uL — ABNORMAL LOW (ref 4.0–10.5)

## 2018-01-03 LAB — DIFFERENTIAL
Basophils Absolute: 0 10*3/uL (ref 0.0–0.1)
Basophils Relative: 1 %
Eosinophils Absolute: 0 10*3/uL (ref 0.0–0.7)
Eosinophils Relative: 1 %
Lymphocytes Relative: 49 %
Lymphs Abs: 0.9 10*3/uL (ref 0.7–4.0)
Monocytes Absolute: 0.3 10*3/uL (ref 0.1–1.0)
Monocytes Relative: 14 %
Neutro Abs: 0.7 10*3/uL — ABNORMAL LOW (ref 1.7–7.7)
Neutrophils Relative %: 35 %

## 2018-01-03 LAB — VITAMIN B12: Vitamin B-12: 343 pg/mL (ref 180–914)

## 2018-01-03 LAB — PHENYTOIN LEVEL, TOTAL: Phenytoin Lvl: <2.5 ug/mL — ABNORMAL LOW (ref 10.0–20.0)

## 2018-01-03 LAB — SAVE SMEAR

## 2018-01-03 MED ORDER — ENSURE ENLIVE PO LIQD
237.0000 mL | Freq: Two times a day (BID) | ORAL | Status: DC
  Administered 2018-01-03: 237 mL via ORAL

## 2018-01-03 MED ORDER — TRAMADOL HCL 50 MG PO TABS
50.0000 mg | ORAL_TABLET | Freq: Four times a day (QID) | ORAL | Status: DC | PRN
  Administered 2018-01-03 – 2018-01-08 (×11): 50 mg via ORAL
  Filled 2018-01-03 (×11): qty 1

## 2018-01-03 MED ORDER — FLUOXETINE HCL 20 MG PO CAPS
20.0000 mg | ORAL_CAPSULE | Freq: Every day | ORAL | Status: DC
  Administered 2018-01-03 – 2018-01-08 (×6): 20 mg via ORAL
  Filled 2018-01-03 (×6): qty 1

## 2018-01-03 MED ORDER — SODIUM CHLORIDE 0.9 % IV SOLN
2.0000 g | INTRAVENOUS | Status: DC
  Administered 2018-01-03 – 2018-01-07 (×5): 2 g via INTRAVENOUS
  Filled 2018-01-03 (×5): qty 2

## 2018-01-03 MED ORDER — BUSPIRONE HCL 5 MG PO TABS
7.5000 mg | ORAL_TABLET | Freq: Two times a day (BID) | ORAL | Status: DC
  Administered 2018-01-03 – 2018-01-07 (×9): 7.5 mg via ORAL
  Filled 2018-01-03 (×9): qty 2

## 2018-01-03 NOTE — Progress Notes (Signed)
Platelets were infused tonight.

## 2018-01-03 NOTE — Progress Notes (Signed)
PROGRESS NOTE    Briana Sweeney  YPP:[id number] DOB: [birth year] DOA: 01/02/2018 PCP: Scot Jun, FNP    Brief Narrative:  Briana Sweeney is a 47 y.o. female with history of alcohol abuse, liver cirrhosis with EGD in December 2018 showing esophageal varices, pancytopenia likely from alcoholism, schizophrenia had presented to the behavioral health with complaints of suicidal thoughts and anxiety. Patient states she has not been taking many of her medications last few weeks as she ran out of it.  Patient admits to drinking vodka.  Patient has had an EGD in December 2018 which showed esophageal varices.      Assessment & Plan:   Principal Problem:   Thrombocytopenia ([place]) Active Problems:   Pancytopenia ([facility])   Alcoholic cirrhosis of liver with ascites ([facility])   Alcohol abuse with alcohol-induced mood disorder ([place])   Schizophrenia ([place])   Alcohol withdrawal/ alcohol abuse: Watch for with drawals.  On CIWA Protocol.   Suicidal thoughts with h/o schizophrenia:  Psychiatry consulted .  Haldol as needed.  Sitter at bedside.    Rectal bleeding: Sec to thrombocytopenia> stool for occult blood is positive.  None since admission.  Monitor platelets and hemoglobin.   H/o liver cirrhosis and esophageal varices on the last EGD:  - resume lasix, spironolactone on discharge.    Hypokalemia: replaced.   Acute blood anemia from ? Rectal bleed vs anemia of chronic disease: Baseline hemoglobin around 11. Currently 9.9.  If hemoglobin continues to drop , will get GI consult for further evaluation    Thrombocytopenia:  Suspect secondary to liver cirrhosis .  S/p 1 unit of platelet transfusion and her platelet count improved to 19000.    Headache with severe thrombocytopenia: Check CT head without contrast.  No focal signs.    DVT prophylaxis: scd's Code Status:  Full code.  Family Communication: none at bedside.  Disposition Plan: pending eval by psychiatry.     Consultants:   Psychiatry  Hematology.    Procedures: none.   Antimicrobials: rocephin.   Subjective: Reports feeling better, headache resolved.   Objective: Vitals:   01/03/18 0207 01/03/18 0241 01/03/18 0611 01/03/18 1300  BP: 120/89 130/89 127/82 (!) 137/97  Pulse: 86 87 89 86  Resp: 16 16 16 18   Temp: 97.7 F (36.5 C) 98.2 F (36.8 C) 98.9 F (37.2 C) 98.7 F (37.1 C)  TempSrc: Oral Oral Oral Oral  SpO2: 100%  97% 97%  Weight:      Height:        Intake/Output Summary (Last 24 hours) at 01/03/2018 1349 Last data filed at 01/03/2018 0611 Gross per 24 hour  Intake 655 ml  Output -  Net 655 ml   Filed Weights   01/02/18 2032 01/02/18 2326  Weight: 59 kg (130 lb) 57.6 kg (126 lb 15.8 oz)    Examination:  General exam: Appears calm and comfortable  Respiratory system: Clear to auscultation. Respiratory effort normal. Cardiovascular system: S1 & S2 heard, RRR. No JVD, murmurs, rubs, gallops or clicks. No pedal edema. Gastrointestinal system: Abdomen is nondistended, soft and nontender. No organomegaly or masses felt. Normal bowel sounds heard. Central nervous system: Alert and oriented. No focal neurological deficits. Extremities: Symmetric 5 x 5 power. Skin: No rashes, lesions or ulcers Psychiatry: Judgement and insight appear normal. Mood & affect appropriate.     Data Reviewed: I have personally reviewed following labs and imaging studies  CBC: Recent Labs  Lab 01/01/18 0355 01/02/18 1935 01/02/18 2257 01/03/18 0417  WBC 3.1*  1.9* 1.9* 2.3*  NEUTROABS  --   --  0.7* 1.0*  HGB 11.9* 11.6* 10.9* 9.9*  HCT 37.6 35.3* 33.4* 30.6*  MCV 91.9 90.5 89.5 90.8  PLT 13* <5* 7* 19*   Basic Metabolic Panel: Recent Labs  Lab 01/01/18 0355 01/02/18 1935 01/03/18 0417  NA 143 140 141  K 3.3* 3.3* 3.6  CL 111 107 108  CO2 21* 18* 19*  GLUCOSE 111* 141* 86  BUN 5* <5* <5*  CREATININE 0.60 0.39* 0.33*  CALCIUM 8.0* 8.5* 7.9*  MG 1.7  --   --     GFR: Estimated Creatinine Clearance: 68.7 mL/min (A) (by C-G formula based on SCr of 0.33 mg/dL (L)). Liver Function Tests: Recent Labs  Lab 01/01/18 0355 01/02/18 1935 01/03/18 0417  AST 99* 95* 87*  ALT 41 34 30  ALKPHOS 247* 196* 170*  BILITOT 1.4* 2.0* 1.6*  PROT 8.7* 8.0 7.1  ALBUMIN 3.2* 3.2* 2.8*   No results for input(s): LIPASE, AMYLASE in the last 168 hours. No results for input(s): AMMONIA in the last 168 hours. Coagulation Profile: Recent Labs  Lab 01/02/18 2257  INR 1.14   Cardiac Enzymes: No results for input(s): CKTOTAL, CKMB, CKMBINDEX, TROPONINI in the last 168 hours. BNP (last 3 results) No results for input(s): PROBNP in the last 8760 hours. HbA1C: No results for input(s): HGBA1C in the last 72 hours. CBG: No results for input(s): GLUCAP in the last 168 hours. Lipid Profile: No results for input(s): CHOL, HDL, LDLCALC, TRIG, CHOLHDL, LDLDIRECT in the last 72 hours. Thyroid Function Tests: No results for input(s): TSH, T4TOTAL, FREET4, T3FREE, THYROIDAB in the last 72 hours. Anemia Panel: Recent Labs    01/02/18 2257  VITAMINB12 343   Sepsis Labs: No results for input(s): PROCALCITON, LATICACIDVEN in the last 168 hours.  No results found for this or any previous visit (from the past 240 hour(s)).       Radiology Studies: US Abdomen Complete  Result Date: 01/02/2018 CLINICAL DATA:  Initial evaluation for splenomegaly. History of hypertonicity with cirrhosis. EXAM: ABDOMEN ULTRASOUND COMPLETE COMPARISON:  Prior CT from 07/24/2017. FINDINGS: Gallbladder: 1.6 cm calculus present within the gallbladder lumen. Gallbladder wall measure within normal limits of 1.9 mm. No free pericholecystic fluid. No sonographic Murphy sign elicited on exam. Common bile duct: Diameter: 5.5 mm Liver: Liver demonstrates coarse heterogeneous and increased echotexture with nodular contour, consistent with cirrhosis. No focal intrahepatic lesion. Portal vein is patent on  color Doppler imaging with normal direction of blood flow towards the liver. IVC: No abnormality visualized. Pancreas: Visualized portion unremarkable. Spleen: Size and appearance within normal limits, measuring 9.8 cm in craniocaudad dimension. Right Kidney: Length: 11.4 cm. Echogenicity within normal limits. No mass or hydronephrosis visualized. Left Kidney: Length: 13.7 cm. Echogenicity within normal limits. No mass or hydronephrosis visualized. Abdominal aorta: No aneurysm visualized. Other findings: None. IMPRESSION: 1. Spleen size within normal limits by sonography. No evidence for splenomegaly. 2. Hepatic cirrhosis. 3. Cholelithiasis. No sonographic features to suggest acute cholecystitis. No biliary dilatation. Electronically Signed   By: Jeannine Boga M.D.   On: 01/02/2018 22:18        Scheduled Meds: . busPIRone  7.5 mg Oral BID  . feeding supplement (ENSURE ENLIVE)  237 mL Oral BID BM  . FLUoxetine  20 mg Oral Daily  . folic acid  1 mg Oral Daily  . haloperidol  2 mg Oral QHS  . LORazepam  0-4 mg Intravenous Q6H   Followed by  . [  START ON 01/04/2018] LORazepam  0-4 mg Intravenous Q12H  . multivitamin with minerals  1 tablet Oral Daily  . thiamine  100 mg Oral Daily   Or  . thiamine  100 mg Intravenous Daily  . topiramate  50 mg Oral Daily   Continuous Infusions: . cefTRIAXone (ROCEPHIN)  IV       LOS: 1 day    Time spent: 35 minutes.     Hosie Poisson, MD [facility] Pager [phone]   If 7PM-7AM, please contact night-coverage [email or web address] Password [email or web address] 01/03/2018, 1:49 PM

## 2018-01-03 NOTE — Progress Notes (Signed)
Pharmacy Consult for Rocephin for SBP px.  Plan: Rocephin 2 gm IV q24 for SBP px Pharmacy to sign off Thanks Peggyann Juba, PharmD, BCPS Pager: [phone] 01/03/2018 9:43 AM

## 2018-01-03 NOTE — Plan of Care (Signed)
  Problem: Clinical Measurements: Goal: Ability to maintain clinical measurements within normal limits will improve Outcome: Progressing Goal: Will remain free from infection Outcome: Progressing Goal: Diagnostic test results will improve Outcome: Progressing Goal: Respiratory complications will improve Outcome: Progressing Goal: Cardiovascular complication will be avoided Outcome: Progressing   Problem: Nutrition: Goal: Adequate nutrition will be maintained Outcome: Progressing   Problem: Coping: Goal: Level of anxiety will decrease Outcome: Progressing   Problem: Elimination: Goal: Will not experience complications related to bowel motility Outcome: Progressing Goal: Will not experience complications related to urinary retention Outcome: Progressing   Problem: Pain Managment: Goal: General experience of comfort will improve Outcome: Progressing   Problem: Safety: Goal: Ability to remain free from injury will improve Outcome: Progressing   Problem: Skin Integrity: Goal: Risk for impaired skin integrity will decrease Outcome: Progressing   Problem: Spiritual Needs Goal: Ability to function at adequate level Outcome: Progressing

## 2018-01-03 NOTE — Plan of Care (Signed)
Patient scoring around 15 on CIWA this shift, received total of 5 mg Ativan this shift with improvement in symptoms.  Patient medicated for headache x 1 with some improvement, CT head negative.  Friend in to visit, sitter remains at bedside.

## 2018-01-04 DIAGNOSIS — D61818 Other pancytopenia: Secondary | ICD-10-CM

## 2018-01-04 DIAGNOSIS — G4700 Insomnia, unspecified: Secondary | ICD-10-CM | POA: Diagnosis not present

## 2018-01-04 DIAGNOSIS — F419 Anxiety disorder, unspecified: Secondary | ICD-10-CM

## 2018-01-04 DIAGNOSIS — D649 Anemia, unspecified: Secondary | ICD-10-CM | POA: Diagnosis not present

## 2018-01-04 DIAGNOSIS — R45851 Suicidal ideations: Secondary | ICD-10-CM

## 2018-01-04 DIAGNOSIS — F209 Schizophrenia, unspecified: Secondary | ICD-10-CM

## 2018-01-04 DIAGNOSIS — R443 Hallucinations, unspecified: Secondary | ICD-10-CM | POA: Diagnosis not present

## 2018-01-04 DIAGNOSIS — R450 Nervousness: Secondary | ICD-10-CM

## 2018-01-04 DIAGNOSIS — Z811 Family history of alcohol abuse and dependence: Secondary | ICD-10-CM

## 2018-01-04 DIAGNOSIS — F10129 Alcohol abuse with intoxication, unspecified: Secondary | ICD-10-CM | POA: Diagnosis not present

## 2018-01-04 DIAGNOSIS — F1014 Alcohol abuse with alcohol-induced mood disorder: Secondary | ICD-10-CM | POA: Diagnosis not present

## 2018-01-04 DIAGNOSIS — F319 Bipolar disorder, unspecified: Secondary | ICD-10-CM

## 2018-01-04 LAB — HEPATIC FUNCTION PANEL
ALT: 28 U/L (ref 14–54)
AST: 74 U/L — ABNORMAL HIGH (ref 15–41)
Albumin: 3.2 g/dL — ABNORMAL LOW (ref 3.5–5.0)
Alkaline Phosphatase: 203 U/L — ABNORMAL HIGH (ref 38–126)
Bilirubin, Direct: 0.8 mg/dL — ABNORMAL HIGH (ref 0.1–0.5)
Indirect Bilirubin: 1.5 mg/dL — ABNORMAL HIGH (ref 0.3–0.9)
Total Bilirubin: 2.3 mg/dL — ABNORMAL HIGH (ref 0.3–1.2)
Total Protein: 8.5 g/dL — ABNORMAL HIGH (ref 6.5–8.1)

## 2018-01-04 LAB — CBC WITH DIFFERENTIAL/PLATELET
Basophils Absolute: 0 10*3/uL (ref 0.0–0.1)
Basophils Relative: 0 %
Eosinophils Absolute: 0.1 10*3/uL (ref 0.0–0.7)
Eosinophils Relative: 4 %
HCT: 34 % — ABNORMAL LOW (ref 36.0–46.0)
Hemoglobin: 11 g/dL — ABNORMAL LOW (ref 12.0–15.0)
Lymphocytes Relative: 36 %
Lymphs Abs: 0.9 10*3/uL (ref 0.7–4.0)
MCH: 29.4 pg (ref 26.0–34.0)
MCHC: 32.4 g/dL (ref 30.0–36.0)
MCV: 90.9 fL (ref 78.0–100.0)
Monocytes Absolute: 0.6 10*3/uL (ref 0.1–1.0)
Monocytes Relative: 23 %
Neutro Abs: 0.9 10*3/uL — ABNORMAL LOW (ref 1.7–7.7)
Neutrophils Relative %: 37 %
Platelets: 17 10*3/uL — CL (ref 150–400)
RBC: 3.74 MIL/uL — ABNORMAL LOW (ref 3.87–5.11)
RDW: 19 % — ABNORMAL HIGH (ref 11.5–15.5)
WBC: 2.5 10*3/uL — ABNORMAL LOW (ref 4.0–10.5)

## 2018-01-04 LAB — BASIC METABOLIC PANEL
Anion gap: 10 (ref 5–15)
BUN: 7 mg/dL (ref 6–20)
CO2: 18 mmol/L — ABNORMAL LOW (ref 22–32)
Calcium: 9.1 mg/dL (ref 8.9–10.3)
Chloride: 105 mmol/L (ref 101–111)
Creatinine, Ser: 0.56 mg/dL (ref 0.44–1.00)
GFR calc Af Amer: >60 mL/min (ref >60)
GFR calc non Af Amer: >60 mL/min (ref >60)
Glucose, Bld: 101 mg/dL — ABNORMAL HIGH (ref 65–99)
Potassium: 3.6 mmol/L (ref 3.5–5.1)
Sodium: 133 mmol/L — ABNORMAL LOW (ref 135–145)

## 2018-01-04 LAB — PLATELET COUNT: Platelets: 17 10*3/uL — CL (ref 150–400)

## 2018-01-04 MED ORDER — BOOST / RESOURCE BREEZE PO LIQD CUSTOM
1.0000 | Freq: Three times a day (TID) | ORAL | Status: DC
  Administered 2018-01-04 – 2018-01-08 (×10): 1 via ORAL

## 2018-01-04 MED ORDER — GABAPENTIN 100 MG PO CAPS
100.0000 mg | ORAL_CAPSULE | Freq: Three times a day (TID) | ORAL | Status: DC
  Administered 2018-01-04 – 2018-01-08 (×13): 100 mg via ORAL
  Filled 2018-01-04 (×13): qty 1

## 2018-01-04 MED ORDER — TRAZODONE HCL 50 MG PO TABS
50.0000 mg | ORAL_TABLET | Freq: Every day | ORAL | Status: DC
  Administered 2018-01-04 – 2018-01-07 (×4): 50 mg via ORAL
  Filled 2018-01-04 (×4): qty 1

## 2018-01-04 NOTE — Consult Note (Signed)
[facility] Consult   Reason for Consult:  Suicidal ideation  Referring Physician:  Dr. Karleen Hampshire Patient Identification: Briana Sweeney MRN:  [id number] Principal Diagnosis: Alcohol abuse with alcohol-induced mood disorder [facility]) Diagnosis:   Patient Active Problem List   Diagnosis Date Noted  . Thrombocytopenia ([place]) [D69.6] 01/02/2018  . GERD (gastroesophageal reflux disease) [K21.9] 11/01/2017  . Trichimoniasis [A59.9] 10/30/2017  . Alcohol dependence ([place]) [F10.20] 10/29/2017  . MDD (major depressive disorder), severe ([place]) [F32.2] 10/28/2017  . Acute hyperactive alcohol withdrawal delirium ([place]) [F10.231] 10/09/2017  . Schizophrenia ([place]) [F20.9] 10/09/2017  . Alcohol abuse with alcohol-induced mood disorder ([place]) [F10.14]   . Alcohol withdrawal ([place]) [F10.239] 09/11/2017  . Esophageal varices without bleeding ([place]) [I85.00]   . Hematemesis [K92.0] 09/01/2017  . Ascites due to alcoholic cirrhosis ([place]) [L39.03]   . Decompensated hepatic cirrhosis ([place]) [K72.90] 07/23/2017  . Alcohol abuse [F10.10] 07/23/2017  . Hepatitis C antibody positive in blood [R76.8] 07/23/2017  . Hypokalemia [E87.6] 07/23/2017  . Jaundice [R17] 07/23/2017  . Coagulopathy ([place]) [D68.9] 07/23/2017  . Hypomagnesemia [E83.42] 07/23/2017  . Pancytopenia ([place]) [E09.233] 07/23/2017  . Alcoholic cirrhosis of liver with ascites ([place]) [K70.31] 07/23/2017    Total Time spent with patient: 45 minutes  Subjective:   Briana Sweeney is a 47 y.o. female patient admitted with alcohol intoxication, suicidal thoughts and depression  HPI: Patient who reports history of Bipolar disorder, Depression, Anxiety, schizophrenia, liver cirrhosis, GERD, HepC, kidney stones, migraines, and seizure disorder. She was referred to [facility] ER for medical clearance but was admitted to inpatient due to very low platelets level. Patient reports worsening depression and suicidal ideation. Patient reports low energy level,  hopelessness, lack of motivation and she appears withdrawn and says she is fearful for her life. She states that a female friend that she has known since the age of 48 escaped [place] jail few days ago,  came to her house and left a threatening note for her. Patient continues to report being depressed, overwhelm with passive suicidal thoughts but denies psychosis or delusional thinking. Patient was discharged from [facility] in February, 2019 but has not been compliant with her prescribed medication but says she has been self medicating with alcohol.  Past Psychiatric History: as above  Risk to Self: Is patient at risk for suicide?: passive suicidal thoughts. Risk to Others:  denies Prior Inpatient Therapy:   Prior Outpatient Therapy:    Past Medical History:  Past Medical History:  Diagnosis Date  . Anxiety   . Bipolar affective disorder ([place])    With anxiety features  . Cirrhosis of liver ([place])    Due to alcohol and hepatitis C  . Depression   . GERD (gastroesophageal reflux disease)   . Hepatitis C 2018   hepatitis c and alcohol related hepatitis  . History of blood transfusion    "blood doesn't clot; I fell down and had to have a transfusion"  . History of kidney stones   . Migraine    "when I get really stressed" (09/01/2017)  . Schizophrenia ([place])   . Seizures ([place])    "when I run out of my RX; lots recently" (09/01/2017)    Past Surgical History:  Procedure Laterality Date  . ESOPHAGOGASTRODUODENOSCOPY N/A 09/03/2017   Procedure: ESOPHAGOGASTRODUODENOSCOPY (EGD);  Surgeon: Doran Stabler, MD;  Location: [facility];  Service: Gastroenterology;  Laterality: N/A;  . FINGER FRACTURE SURGERY Left    "shattered my pinky"  . FRACTURE SURGERY    .  IR PARACENTESIS  07/23/2017  . IR PARACENTESIS  07/2017   "did it twice in the same week" (09/01/2017)  . SHOULDER OPEN ROTATOR CUFF REPAIR Right   . TUBAL LIGATION    . VAGINAL HYSTERECTOMY     Family History:  Family History   Problem Relation Age of Onset  . Lung cancer Mother 48  . Alcohol abuse Mother   . Throat cancer Father 80   Family Psychiatric  History:  Social History:  Social History   Substance and Sexual Activity  Alcohol Use Yes  . Alcohol/week: 16.8 oz  . Types: 28 Cans of beer per week   Comment: weekly "I have cut back"     Social History   Substance and Sexual Activity  Drug Use No    Social History   Socioeconomic History  . Marital status: Legally Separated    Spouse name: Not on file  . Number of children: Not on file  . Years of education: Not on file  . Highest education level: Not on file  Occupational History  . Occupation: applying for disability  Social Needs  . Financial resource strain: Not on file  . Food insecurity:    Worry: Not on file    Inability: Not on file  . Transportation needs:    Medical: Not on file    Non-medical: Not on file  Tobacco Use  . Smoking status: Never Smoker  . Smokeless tobacco: Never Used  Substance and Sexual Activity  . Alcohol use: Yes    Alcohol/week: 16.8 oz    Types: 28 Cans of beer per week    Comment: weekly "I have cut back"  . Drug use: No  . Sexual activity: Not Currently  Lifestyle  . Physical activity:    Days per week: Not on file    Minutes per session: Not on file  . Stress: Not on file  Relationships  . Social connections:    Talks on phone: Not on file    Gets together: Not on file    Attends religious service: Not on file    Active member of club or organization: Not on file    Attends meetings of clubs or organizations: Not on file    Relationship status: Not on file  Other Topics Concern  . Not on file  Social History Narrative   She moved with a boyfriend to [place] and was followed at [facility].  He died of a massive heart attack in 8/18, per her report, and so she moved back to [place] and is living with a friend.   Additional Social History:    Allergies:  No Known  Allergies  Labs:  Results for orders placed or performed during the hospital encounter of 01/02/18 (from the past 48 hour(s))  ABO/Rh     Status: None   Collection Time: 01/02/18  7:29 PM  Result Value Ref Range   ABO/RH(D)      O POS Performed at [facility], [place] [address] [place], [place] [address]   Comprehensive metabolic panel     Status: Abnormal   Collection Time: 01/02/18  7:35 PM  Result Value Ref Range   Sodium 140 135 - 145 mmol/L   Potassium 3.3 (L) 3.5 - 5.1 mmol/L   Chloride 107 101 - 111 mmol/L   CO2 18 (L) 22 - 32 mmol/L   Glucose, Bld 141 (H) 65 - 99 mg/dL   BUN <5 (L) 6 - 20  mg/dL   Creatinine, Ser 0.39 (L) 0.44 - 1.00 mg/dL   Calcium 8.5 (L) 8.9 - 10.3 mg/dL   Total Protein 8.0 6.5 - 8.1 g/dL   Albumin 3.2 (L) 3.5 - 5.0 g/dL   AST 95 (H) 15 - 41 U/L   ALT 34 14 - 54 U/L   Alkaline Phosphatase 196 (H) 38 - 126 U/L   Total Bilirubin 2.0 (H) 0.3 - 1.2 mg/dL   GFR calc non Af Amer >60 >60 mL/min   GFR calc Af Amer >60 >60 mL/min    Comment: (NOTE) The eGFR has been calculated using the CKD EPI equation. This calculation has not been validated in all clinical situations. eGFR's persistently <60 mL/min signify possible Chronic Kidney Disease.    Anion gap 15 5 - 15    Comment: Performed at [facility], [place] [address], [place], [place] [address]  Ethanol     Status: Abnormal   Collection Time: 01/02/18  7:35 PM  Result Value Ref Range   Alcohol, Ethyl (B) 397 (HH) <10 mg/dL    Comment:        LOWEST DETECTABLE LIMIT FOR SERUM ALCOHOL IS 10 mg/dL FOR MEDICAL PURPOSES ONLY CRITICAL RESULT CALLED TO, READ BACK BY AND VERIFIED WITH: TALKINGTON,J AT 2050 ON 01/02/2018 BY MOSLEY,J Performed at [facility], [place] [address] [place], [place] [address]   Salicylate level     Status: None   Collection Time: 01/02/18  7:35 PM  Result Value Ref Range   Salicylate Lvl <1.0 2.8 - 30.0 mg/dL    Comment:  Performed at [facility], [place] [address], [place], [place] [address]  Acetaminophen level     Status: Abnormal   Collection Time: 01/02/18  7:35 PM  Result Value Ref Range   Acetaminophen (Tylenol), Serum <10 (L) 10 - 30 ug/mL    Comment:        THERAPEUTIC CONCENTRATIONS VARY SIGNIFICANTLY. A RANGE OF 10-30 ug/mL MAY BE AN EFFECTIVE CONCENTRATION FOR MANY PATIENTS. HOWEVER, SOME ARE BEST TREATED AT CONCENTRATIONS OUTSIDE THIS RANGE. ACETAMINOPHEN CONCENTRATIONS >150 ug/mL AT 4 HOURS AFTER INGESTION AND >50 ug/mL AT 12 HOURS AFTER INGESTION ARE OFTEN ASSOCIATED WITH TOXIC REACTIONS. Performed at [facility], [place] [address], [place], [place] [address]   cbc     Status: Abnormal   Collection Time: 01/02/18  7:35 PM  Result Value Ref Range   WBC 1.9 (L) 4.0 - 10.5 K/uL   RBC 3.90 3.87 - 5.11 MIL/uL   Hemoglobin 11.6 (L) 12.0 - 15.0 g/dL   HCT 35.3 (L) 36.0 - 46.0 %   MCV 90.5 78.0 - 100.0 fL   MCH 29.7 26.0 - 34.0 pg   MCHC 32.9 30.0 - 36.0 g/dL   RDW 19.5 (H) 11.5 - 15.5 %   Platelets <5 (LL) 150 - 400 K/uL    Comment: REPEATED TO VERIFY SPECIMEN CHECKED FOR CLOTS PLATELET COUNT CONFIRMED BY SMEAR CRITICAL RESULT CALLED TO, READ BACK BY AND VERIFIED WITH: Annabell Howells [id number] @ 2015 BY J SCOTTON Performed at [facility], [place] [address] [place], [place] [address]   I-Stat beta hCG blood, ED     Status: None   Collection Time: 01/02/18  7:40 PM  Result Value Ref Range   I-stat hCG, quantitative <5.0 <5 mIU/mL   Comment 3            Comment:   GEST. AGE  CONC.  (mIU/mL)   <=1 WEEK        5 - 50     2 WEEKS       50 - 500     3 WEEKS       100 - 10,000     4 WEEKS     1,000 - 30,000        FEMALE AND NON-PREGNANT FEMALE:     LESS THAN 5 mIU/mL   POC occult blood, ED Provider will collect     Status: Abnormal   Collection Time: 01/02/18  9:16 PM  Result Value Ref Range   Fecal Occult Bld POSITIVE  (A) NEGATIVE  Protime-INR     Status: None   Collection Time: 01/02/18 10:57 PM  Result Value Ref Range   Prothrombin Time 14.5 11.4 - 15.2 seconds   INR 1.14     Comment: Performed at [facility], [place] [address] [place], [place] [address]  Prepare Pheresed Platelets     Status: None (Preliminary result)   Collection Time: 01/02/18 10:57 PM  Result Value Ref Range   Unit Number [id number]    Blood Component Type PLTP LR1 PAS    Unit division 00    Status of Unit ISSUED    Transfusion Status      OK TO TRANSFUSE Performed at [place] [address] [place], [place] [address]   Save smear     Status: None   Collection Time: 01/02/18 10:57 PM  Result Value Ref Range   Smear Review SMEAR STAINED AND AVAILABLE FOR REVIEW     Comment: Performed at [facility], [place] [address] [place], [place] [address]  Differential     Status: Abnormal   Collection Time: 01/02/18 10:57 PM  Result Value Ref Range   Neutrophils Relative % 35 %   Lymphocytes Relative 49 %   Monocytes Relative 14 %   Eosinophils Relative 1 %   Basophils Relative 1 %   Neutro Abs 0.7 (L) 1.7 - 7.7 K/uL   Lymphs Abs 0.9 0.7 - 4.0 K/uL   Monocytes Absolute 0.3 0.1 - 1.0 K/uL   Eosinophils Absolute 0.0 0.0 - 0.7 K/uL   Basophils Absolute 0.0 0.0 - 0.1 K/uL   RBC Morphology TARGET CELLS     Comment: Performed at [facility], [place] [address], [place], [place] [address]  Vitamin B12     Status: None   Collection Time: 01/02/18 10:57 PM  Result Value Ref Range   Vitamin B-12 343 180 - 914 pg/mL    Comment: (NOTE) This assay is not validated for testing neonatal or myeloproliferative syndrome specimens for Vitamin B12 levels. Performed at [facility], [place] [address], [place], [place] [address]   CBC     Status: Abnormal   Collection Time: 01/02/18 10:57 PM  Result Value Ref Range   WBC 1.9 (L) 4.0 - 10.5 K/uL   RBC 3.73  (L) 3.87 - 5.11 MIL/uL   Hemoglobin 10.9 (L) 12.0 - 15.0 g/dL   HCT 33.4 (L) 36.0 - 46.0 %   MCV 89.5 78.0 - 100.0 fL   MCH 29.2 26.0 - 34.0 pg   MCHC 32.6 30.0 - 36.0 g/dL   RDW 19.7 (H) 11.5 - 15.5 %   Platelets 7 (LL) 150 - 400 K/uL    Comment: REPEATED TO VERIFY CRITICAL VALUE NOTED.  VALUE IS CONSISTENT WITH PREVIOUSLY REPORTED AND CALLED VALUE. Performed  at [facility], [place] [address] [place], [place] [address]   Basic metabolic panel     Status: Abnormal   Collection Time: 01/03/18  4:17 AM  Result Value Ref Range   Sodium 141 135 - 145 mmol/L   Potassium 3.6 3.5 - 5.1 mmol/L   Chloride 108 101 - 111 mmol/L   CO2 19 (L) 22 - 32 mmol/L   Glucose, Bld 86 65 - 99 mg/dL   BUN <5 (L) 6 - 20 mg/dL   Creatinine, Ser 0.33 (L) 0.44 - 1.00 mg/dL   Calcium 7.9 (L) 8.9 - 10.3 mg/dL   GFR calc non Af Amer >60 >60 mL/min   GFR calc Af Amer >60 >60 mL/min    Comment: (NOTE) The eGFR has been calculated using the CKD EPI equation. This calculation has not been validated in all clinical situations. eGFR's persistently <60 mL/min signify possible Chronic Kidney Disease.    Anion gap 14 5 - 15    Comment: Performed at [facility], [place] [address], [place], [place] [address]  CBC WITH DIFFERENTIAL     Status: Abnormal   Collection Time: 01/03/18  4:17 AM  Result Value Ref Range   WBC 2.3 (L) 4.0 - 10.5 K/uL   RBC 3.37 (L) 3.87 - 5.11 MIL/uL   Hemoglobin 9.9 (L) 12.0 - 15.0 g/dL   HCT 30.6 (L) 36.0 - 46.0 %   MCV 90.8 78.0 - 100.0 fL   MCH 29.4 26.0 - 34.0 pg   MCHC 32.4 30.0 - 36.0 g/dL   RDW 19.7 (H) 11.5 - 15.5 %   Platelets 19 (LL) 150 - 400 K/uL    Comment: REPEATED TO VERIFY CRITICAL VALUE NOTED.  VALUE IS CONSISTENT WITH PREVIOUSLY REPORTED AND CALLED VALUE.    Neutrophils Relative % 46 %   Neutro Abs 1.0 (L) 1.7 - 7.7 K/uL   Lymphocytes Relative 41 %   Lymphs Abs 0.9 0.7 - 4.0 K/uL   Monocytes Relative 10 %   Monocytes Absolute  0.2 0.1 - 1.0 K/uL   Eosinophils Relative 2 %   Eosinophils Absolute 0.1 0.0 - 0.7 K/uL   Basophils Relative 1 %   Basophils Absolute 0.0 0.0 - 0.1 K/uL    Comment: Performed at [facility], [place] [address] [place], [place] [address]  Hepatic function panel     Status: Abnormal   Collection Time: 01/03/18  4:17 AM  Result Value Ref Range   Total Protein 7.1 6.5 - 8.1 g/dL   Albumin 2.8 (L) 3.5 - 5.0 g/dL   AST 87 (H) 15 - 41 U/L   ALT 30 14 - 54 U/L   Alkaline Phosphatase 170 (H) 38 - 126 U/L   Total Bilirubin 1.6 (H) 0.3 - 1.2 mg/dL   Bilirubin, Direct 0.6 (H) 0.1 - 0.5 mg/dL   Indirect Bilirubin 1.0 (H) 0.3 - 0.9 mg/dL    Comment: Performed at [facility], [place] [address] [place], [place] [address]  Phenytoin level, total     Status: Abnormal   Collection Time: 01/03/18  7:41 AM  Result Value Ref Range   Phenytoin Lvl <2.5 (L) 10.0 - 20.0 ug/mL    Comment: Performed at [facility], [place] [address], [place], [place] [address]  Platelet count     Status: Abnormal   Collection Time: 01/04/18  4:56 AM  Result Value Ref Range   Platelets 17 (LL) 150 - 400 K/uL  Comment: REPEATED TO VERIFY CRITICAL VALUE NOTED.  VALUE IS CONSISTENT WITH PREVIOUSLY REPORTED AND CALLED VALUE. PLATELET COUNT CONFIRMED BY SMEAR Performed at [place] [address] [place], [place] [address]   CBC with Differential/Platelet     Status: Abnormal   Collection Time: 01/04/18  8:51 AM  Result Value Ref Range   WBC 2.5 (L) 4.0 - 10.5 K/uL    Comment: REPEATED TO VERIFY WHITE COUNT CONFIRMED ON SMEAR    RBC 3.74 (L) 3.87 - 5.11 MIL/uL   Hemoglobin 11.0 (L) 12.0 - 15.0 g/dL   HCT 34.0 (L) 36.0 - 46.0 %   MCV 90.9 78.0 - 100.0 fL   MCH 29.4 26.0 - 34.0 pg   MCHC 32.4 30.0 - 36.0 g/dL   RDW 19.0 (H) 11.5 - 15.5 %   Platelets 17 (LL) 150 - 400 K/uL    Comment: REPEATED TO VERIFY SPECIMEN CHECKED FOR CLOTS CRITICAL  VALUE NOTED.  VALUE IS CONSISTENT WITH PREVIOUSLY REPORTED AND CALLED VALUE.    Neutrophils Relative % 37 %   Lymphocytes Relative 36 %   Monocytes Relative 23 %   Eosinophils Relative 4 %   Basophils Relative 0 %   Neutro Abs 0.9 (L) 1.7 - 7.7 K/uL   Lymphs Abs 0.9 0.7 - 4.0 K/uL   Monocytes Absolute 0.6 0.1 - 1.0 K/uL   Eosinophils Absolute 0.1 0.0 - 0.7 K/uL   Basophils Absolute 0.0 0.0 - 0.1 K/uL   Smear Review MORPHOLOGY UNREMARKABLE     Comment: Performed at [facility], [place] [address], [place], [place] [address]  Basic metabolic panel     Status: Abnormal   Collection Time: 01/04/18  8:51 AM  Result Value Ref Range   Sodium 133 (L) 135 - 145 mmol/L    Comment: DELTA CHECK NOTED   Potassium 3.6 3.5 - 5.1 mmol/L   Chloride 105 101 - 111 mmol/L   CO2 18 (L) 22 - 32 mmol/L   Glucose, Bld 101 (H) 65 - 99 mg/dL   BUN 7 6 - 20 mg/dL   Creatinine, Ser 0.56 0.44 - 1.00 mg/dL   Calcium 9.1 8.9 - 10.3 mg/dL   GFR calc non Af Amer >60 >60 mL/min   GFR calc Af Amer >60 >60 mL/min    Comment: (NOTE) The eGFR has been calculated using the CKD EPI equation. This calculation has not been validated in all clinical situations. eGFR's persistently <60 mL/min signify possible Chronic Kidney Disease.    Anion gap 10 5 - 15    Comment: Performed at [facility], [place] [address], [place],  [address]    Current Facility-Administered Medications  Medication Dose Route Frequency Provider Last Rate Last Dose  . acetaminophen (TYLENOL) tablet 650 mg  650 mg Oral Q6H PRN Rise Patience, MD       Or  . acetaminophen (TYLENOL) suppository 650 mg  650 mg Rectal Q6H PRN Rise Patience, MD      . busPIRone (BUSPAR) tablet 7.5 mg  7.5 mg Oral BID Hosie Poisson, MD   7.5 mg at 01/04/18 1032  . cefTRIAXone (ROCEPHIN) 2 g in sodium chloride 0.9 % 100 mL IVPB  2 g Intravenous Q24H Emiliano Dyer, [facility]   Stopped at 01/03/18 2309  . feeding  supplement (BOOST / RESOURCE BREEZE) liquid 1 Container  1 Container Oral TID BM Hosie Poisson, MD   1 Container at 01/04/18 1044  . FLUoxetine (PROZAC) capsule  20 mg  20 mg Oral Daily Hosie Poisson, MD   20 mg at 01/04/18 1041  . folic acid (FOLVITE) tablet 1 mg  1 mg Oral Daily Rise Patience, MD   1 mg at 01/04/18 1033  . haloperidol (HALDOL) 2 MG/ML solution 2 mg  2 mg Oral QHS Rise Patience, MD   2 mg at 01/03/18 2238  . LORazepam (ATIVAN) injection 0-4 mg  0-4 mg Intravenous Q6H Rise Patience, MD   2 mg at 01/04/18 1041   Followed by  . LORazepam (ATIVAN) injection 0-4 mg  0-4 mg Intravenous Q12H Rise Patience, MD      . LORazepam (ATIVAN) tablet 1 mg  1 mg Oral Q6H PRN Rise Patience, MD   1 mg at 01/03/18 0843   Or  . LORazepam (ATIVAN) injection 1 mg  1 mg Intravenous Q6H PRN Rise Patience, MD   1 mg at 01/03/18 0051  . multivitamin with minerals tablet 1 tablet  1 tablet Oral Daily Rise Patience, MD   1 tablet at 01/04/18 1032  . ondansetron (ZOFRAN) tablet 4 mg  4 mg Oral Q6H PRN Rise Patience, MD       Or  . ondansetron [facility]) injection 4 mg  4 mg Intravenous Q6H PRN Rise Patience, MD      . thiamine (VITAMIN B-1) tablet 100 mg  100 mg Oral Daily Rise Patience, MD   100 mg at 01/04/18 1033   Or  . thiamine (B-1) injection 100 mg  100 mg Intravenous Daily Rise Patience, MD      . topiramate (TOPAMAX) tablet 50 mg  50 mg Oral Daily Leodis Sias T, RPH   50 mg at 01/04/18 0726  . traMADol (ULTRAM) tablet 50 mg  50 mg Oral Q6H PRN Hosie Poisson, MD   50 mg at 01/04/18 0726  . traZODone (DESYREL) tablet 50 mg  50 mg Oral QHS Hosie Poisson, MD        Musculoskeletal: Strength & Muscle Tone: within normal limits Gait & Station: normal Patient leans: N/A  Psychiatric Specialty Exam: Physical Exam  Psychiatric: Her speech is delayed. She is slowed and withdrawn. Cognition and memory are normal. She  expresses impulsivity. She exhibits a depressed mood. She expresses suicidal ideation.    Review of Systems  Constitutional: Positive for malaise/fatigue.  HENT: Negative.   Eyes: Negative.   Respiratory: Negative.   Cardiovascular: Negative.   Gastrointestinal: Negative.   Genitourinary: Negative.   Musculoskeletal: Positive for myalgias.  Skin: Negative.   Neurological: Positive for tremors.  Endo/Heme/Allergies: Negative.   Psychiatric/Behavioral: Positive for depression, substance abuse and suicidal ideas. The patient is nervous/anxious and has insomnia.     Blood pressure (!) 132/99, pulse (!) 122, temperature 98.6 F (37 C), temperature source Oral, resp. rate 18, height 4' 11" (1.499 m), weight 57.6 kg (126 lb 15.8 oz), SpO2 97 %.Body mass index is 25.65 kg/m.  General Appearance: Casual  Eye Contact:  Fair  Speech:  Slow  Volume:  Decreased  Mood:  Depressed  Affect:  Constricted  Thought Process:  Coherent and Descriptions of Associations: Intact  Orientation:  Full (Time, Place, and Person)  Thought Content:  Logical  Suicidal Thoughts:  Yes.  without intent/plan  Homicidal Thoughts:  No  Memory:  Immediate;   Good Recent;   Good Remote;   Good  Judgement:  Poor  Insight:  Shallow  Psychomotor Activity:  Psychomotor Retardation  Concentration:  Concentration: Fair and Attention Span: Fair  Recall:  [employer] of Knowledge:  Fair  Language:  Good  Akathisia:  No  Handed:  Right  AIMS (if indicated):     Assets:  Communication Skills  ADL's:  Intact  Cognition:  WNL  Sleep:   poor     Treatment Plan Summary: Daily contact with patient to assess and evaluate symptoms and progress in treatment and Medication management  -Consider taking patient off Tylenol, patient with alcohol abuse with elevated liver enzymes. -Consider Gabapentin 100 mg tid for alcohol abuse/mood -Continue Prozac 20 mg daily. -Continue Lorazepam Alcohol detox protocol  Disposition:  Recommend psychiatric Inpatient admission when medically cleared. Supportive therapy provided about ongoing stressors. Unit social worker to asist with inpatient placement  Corena Pilgrim, MD 01/04/2018 10:47 AM

## 2018-01-04 NOTE — Progress Notes (Signed)
Initial Nutrition Assessment  INTERVENTION:   Provide Boost Breeze po TID, each supplement provides 250 kcal and 9 grams of protein  NUTRITION DIAGNOSIS:   Increased nutrient needs related to chronic illness(ETOH abuse) as evidenced by estimated needs.  GOAL:   Patient will meet greater than or equal to 90% of their needs   MONITOR:   PO intake, Supplement acceptance, Labs, Weight trends, I & O's  REASON FOR ASSESSMENT:   Malnutrition Screening Tool   ASSESSMENT:   47 y.o. female patient admitted with alcohol intoxication, suicidal thoughts and depression  Pt with no PO intakes recorded. Pt unreliable historian. Pt does prefer Boost Breeze supplements over Ensure supplements. RD changed supplement order. Pt expected to discharge back to [facility] once medically cleared.  Per chart review, pt's weight is stable.  Medications: Folic acid tablet daily, Multivitamin with minerals daily, Thiamine tablet daily  Labs reviewed: Low Na   NUTRITION - FOCUSED PHYSICAL EXAM:  Nutrition focused physical exam shows no sign of depletion of muscle mass or body fat.  Diet Order:  Diet 2 gram sodium Room service appropriate? Yes; Fluid consistency: Thin  EDUCATION NEEDS:   No education needs have been identified at this time  Skin:  Skin Assessment: Reviewed RN Assessment  Last BM:  4/26  Height:   Ht Readings from Last 1 Encounters:  01/02/18 4\' 11"  (1.499 m)    Weight:   Wt Readings from Last 1 Encounters:  01/02/18 126 lb 15.8 oz (57.6 kg)    Ideal Body Weight:  44.7 kg  BMI:  Body mass index is 25.65 kg/m.  Estimated Nutritional Needs:   Kcal:  3614-4315  Protein:  80-90g  Fluid:  1.9L/day  Clayton Bibles, MS, RD, LDN [place] Dietitian Pager: [phone] After Hours Pager: [phone]

## 2018-01-04 NOTE — Progress Notes (Signed)
PROGRESS NOTE    Briana Sweeney  BTD:[id number] DOB: February 21, 1971 DOA: 01/02/2018 PCP: Scot Jun, FNP    Brief Narrative:  Briana Sweeney is a 47 y.o. female with history of alcohol abuse, liver cirrhosis with EGD in December 2018 showing esophageal varices, pancytopenia likely from alcoholism, schizophrenia had presented to the behavioral health with complaints of suicidal thoughts and anxiety. Patient states she has not been taking many of her medications last few weeks as she ran out of it.  Patient admits to drinking vodka.  Patient has had an EGD in December 2018 which showed esophageal varices.      Assessment & Plan:   Principal Problem:   Alcohol abuse with alcohol-induced mood disorder ([place]) Active Problems:   Pancytopenia ([place])   Alcoholic cirrhosis of liver with ascites (HCC)   Schizophrenia (HCC)   Thrombocytopenia (HCC)   Alcohol withdrawal/ alcohol abuse: Watch for with drawals.  On CIWA Protocol.   Suicidal thoughts with h/o schizophrenia:  Psychiatry consulted recommended inpatient psychiatry admission once stable. Appreciate recommendations.  Haldol as needed.  Sitter at bedside.    Rectal bleeding: Sec to thrombocytopenia> stool for occult blood is positive.  None since admission.  Monitor platelets and hemoglobin.    H/o liver cirrhosis and esophageal varices on the last EGD:  - resume lasix, spironolactone on discharge.  - no ascites today.    Hypokalemia: replaced.   Acute blood anemia from ? Rectal bleed vs anemia of chronic disease: Baseline hemoglobin around 11.   If hemoglobin continues to drop , will get GI consult for further evaluation    Thrombocytopenia:  Suspect secondary to liver cirrhosis .  S/p 1 unit of platelet transfusion and her platelet count improved to 19000 then dropped to 17,000.    Headache with severe thrombocytopenia: Negative. CT head without contrast.  No focal signs.    Pancytopenia:  Possibly from  liver cirrhosis.    DVT prophylaxis: scd's Code Status:  Full code.  Family Communication: none at bedside.  Disposition Plan: to [facility] when platelets improve.    Consultants:   Psychiatry  Hematology.    Procedures: none.   Antimicrobials: rocephin.   Subjective: Reports feeling better, headache resolved.  Wants something for sleep.   Objective: Vitals:   01/04/18 0006 01/04/18 0600 01/04/18 1026 01/04/18 1257  BP: (!) 138/97 132/90 (!) 132/99 125/89  Pulse: 92 97 (!) 122 89  Resp: 18 18  16   Temp: 98.9 F (37.2 C) 98.6 F (37 C)  98.7 F (37.1 C)  TempSrc: Oral Oral  Oral  SpO2: 98% 97%  98%  Weight:      Height:        Intake/Output Summary (Last 24 hours) at 01/04/2018 1802 Last data filed at 01/04/2018 1500 Gross per 24 hour  Intake 940 ml  Output -  Net 940 ml   Filed Weights   01/02/18 2032 01/02/18 2326  Weight: 59 kg (130 lb) 57.6 kg (126 lb 15.8 oz)    Examination:  General exam: Appears calm and comfortable not in distress.  Respiratory system: Clear to auscultation. Respiratory effort normal. No wheezing or rhonchi.  Cardiovascular system: S1 & S2 heard, RRR. No JVD,  No pedal edema. Gastrointestinal system: Abdomen is soft non tender non distended bowel sounds heard.  Central nervous system: Alert and oriented. Non focal.  Extremities: Symmetric 5 x 5 power. No cyanosis or clubbing.  Skin: No rashes, lesions or ulcers Psychiatry:  Mood & affect appropriate.  Data Reviewed: I have personally reviewed following labs and imaging studies  CBC: Recent Labs  Lab 01/01/18 0355 01/02/18 1935 01/02/18 2257 01/03/18 0417 01/04/18 0456 01/04/18 0851  WBC 3.1* 1.9* 1.9* 2.3*  --  2.5*  NEUTROABS  --   --  0.7* 1.0*  --  0.9*  HGB 11.9* 11.6* 10.9* 9.9*  --  11.0*  HCT 37.6 35.3* 33.4* 30.6*  --  34.0*  MCV 91.9 90.5 89.5 90.8  --  90.9  PLT 13* <5* 7* 19* 17* 17*   Basic Metabolic Panel: Recent Labs  Lab 01/01/18 0355  01/02/18 1935 01/03/18 0417 01/04/18 0851  NA 143 140 141 133*  K 3.3* 3.3* 3.6 3.6  CL 111 107 108 105  CO2 21* 18* 19* 18*  GLUCOSE 111* 141* 86 101*  BUN 5* <5* <5* 7  CREATININE 0.60 0.39* 0.33* 0.56  CALCIUM 8.0* 8.5* 7.9* 9.1  MG 1.7  --   --   --    GFR: Estimated Creatinine Clearance: 68.7 mL/min (by C-G formula based on SCr of 0.56 mg/dL). Liver Function Tests: Recent Labs  Lab 01/01/18 0355 01/02/18 1935 01/03/18 0417 01/04/18 0851  AST 99* 95* 87* 74*  ALT 41 34 30 28  ALKPHOS 247* 196* 170* 203*  BILITOT 1.4* 2.0* 1.6* 2.3*  PROT 8.7* 8.0 7.1 8.5*  ALBUMIN 3.2* 3.2* 2.8* 3.2*   No results for input(s): LIPASE, AMYLASE in the last 168 hours. No results for input(s): AMMONIA in the last 168 hours. Coagulation Profile: Recent Labs  Lab 01/02/18 2257  INR 1.14   Cardiac Enzymes: No results for input(s): CKTOTAL, CKMB, CKMBINDEX, TROPONINI in the last 168 hours. BNP (last 3 results) No results for input(s): PROBNP in the last 8760 hours. HbA1C: No results for input(s): HGBA1C in the last 72 hours. CBG: No results for input(s): GLUCAP in the last 168 hours. Lipid Profile: No results for input(s): CHOL, HDL, LDLCALC, TRIG, CHOLHDL, LDLDIRECT in the last 72 hours. Thyroid Function Tests: No results for input(s): TSH, T4TOTAL, FREET4, T3FREE, THYROIDAB in the last 72 hours. Anemia Panel: Recent Labs    01/02/18 2257  VITAMINB12 343   Sepsis Labs: No results for input(s): PROCALCITON, LATICACIDVEN in the last 168 hours.  No results found for this or any previous visit (from the past 240 hour(s)).       Radiology Studies: Ct Head Wo Contrast  Result Date: 01/03/2018 CLINICAL DATA:  Headache, acute, severe, worst of life. Head pain and nose bleeds for 2 months. EXAM: CT HEAD WITHOUT CONTRAST TECHNIQUE: Contiguous axial images were obtained from the base of the skull through the vertex without intravenous contrast. COMPARISON:  None. FINDINGS: Brain:  No evidence of acute infarction, hemorrhage, hydrocephalus, extra-axial collection or mass lesion/mass effect. Two slices are affected by streak artifact from an earing. Questionable small calcification in the left cerebellum, which would be nonspecific in isolation and incidental to the history. Mild cerebral volume loss. Vascular: No hyperdense vessel or unexpected calcification. Skull: Normal. Negative for fracture or focal lesion. Sinuses/Orbits: No acute finding. IMPRESSION: Negative head CT. Electronically Signed   By: Monte Fantasia M.D.   On: 01/03/2018 18:20   US Abdomen Complete  Result Date: 01/02/2018 CLINICAL DATA:  Initial evaluation for splenomegaly. History of hypertonicity with cirrhosis. EXAM: ABDOMEN ULTRASOUND COMPLETE COMPARISON:  Prior CT from 07/24/2017. FINDINGS: Gallbladder: 1.6 cm calculus present within the gallbladder lumen. Gallbladder wall measure within normal limits of 1.9 mm. No free pericholecystic fluid. No sonographic  Murphy sign elicited on exam. Common bile duct: Diameter: 5.5 mm Liver: Liver demonstrates coarse heterogeneous and increased echotexture with nodular contour, consistent with cirrhosis. No focal intrahepatic lesion. Portal vein is patent on color Doppler imaging with normal direction of blood flow towards the liver. IVC: No abnormality visualized. Pancreas: Visualized portion unremarkable. Spleen: Size and appearance within normal limits, measuring 9.8 cm in craniocaudad dimension. Right Kidney: Length: 11.4 cm. Echogenicity within normal limits. No mass or hydronephrosis visualized. Left Kidney: Length: 13.7 cm. Echogenicity within normal limits. No mass or hydronephrosis visualized. Abdominal aorta: No aneurysm visualized. Other findings: None. IMPRESSION: 1. Spleen size within normal limits by sonography. No evidence for splenomegaly. 2. Hepatic cirrhosis. 3. Cholelithiasis. No sonographic features to suggest acute cholecystitis. No biliary dilatation.  Electronically Signed   By: Jeannine Boga M.D.   On: 01/02/2018 22:18        Scheduled Meds: . busPIRone  7.5 mg Oral BID  . feeding supplement  1 Container Oral TID BM  . FLUoxetine  20 mg Oral Daily  . folic acid  1 mg Oral Daily  . gabapentin  100 mg Oral TID  . haloperidol  2 mg Oral QHS  . LORazepam  0-4 mg Intravenous Q12H  . multivitamin with minerals  1 tablet Oral Daily  . thiamine  100 mg Oral Daily   Or  . thiamine  100 mg Intravenous Daily  . topiramate  50 mg Oral Daily  . traZODone  50 mg Oral QHS   Continuous Infusions: . cefTRIAXone (ROCEPHIN)  IV Stopped (01/03/18 2309)     LOS: 2 days    Time spent: 35 minutes.     Hosie Poisson, MD [facility] Pager [phone]   If 7PM-7AM, please contact night-coverage [email or web address] Password [facility] 01/04/2018, 6:02 PM

## 2018-01-04 NOTE — Plan of Care (Signed)
VSS, patient received a total of 4 mg IV Ativan this shift, CIWA range 14 to 15.  Friend in to visit.  Sitter remains at bedside.

## 2018-01-04 NOTE — Plan of Care (Signed)
  Problem: Nutrition: Goal: Adequate nutrition will be maintained Outcome: Progressing   Problem: Elimination: Goal: Will not experience complications related to bowel motility Outcome: Progressing Goal: Will not experience complications related to urinary retention Outcome: Progressing   Problem: Safety: Goal: Ability to remain free from injury will improve Outcome: Progressing   Problem: Skin Integrity: Goal: Risk for impaired skin integrity will decrease Outcome: Progressing   Problem: Spiritual Needs Goal: Ability to function at adequate level Outcome: Progressing

## 2018-01-05 DIAGNOSIS — R443 Hallucinations, unspecified: Secondary | ICD-10-CM | POA: Diagnosis not present

## 2018-01-05 DIAGNOSIS — D696 Thrombocytopenia, unspecified: Secondary | ICD-10-CM | POA: Diagnosis not present

## 2018-01-05 DIAGNOSIS — D649 Anemia, unspecified: Secondary | ICD-10-CM | POA: Diagnosis not present

## 2018-01-05 DIAGNOSIS — F1014 Alcohol abuse with alcohol-induced mood disorder: Secondary | ICD-10-CM | POA: Diagnosis not present

## 2018-01-05 DIAGNOSIS — K7030 Alcoholic cirrhosis of liver without ascites: Secondary | ICD-10-CM

## 2018-01-05 DIAGNOSIS — D61818 Other pancytopenia: Secondary | ICD-10-CM | POA: Diagnosis not present

## 2018-01-05 DIAGNOSIS — D72819 Decreased white blood cell count, unspecified: Secondary | ICD-10-CM | POA: Diagnosis not present

## 2018-01-05 LAB — PREPARE PLATELET PHERESIS: Unit division: 0

## 2018-01-05 LAB — BPAM PLATELET PHERESIS
Blood Product Expiration Date: 201904292359
ISSUE DATE / TIME: 201904270215
Unit Type and Rh: 5100

## 2018-01-05 LAB — PATHOLOGIST SMEAR REVIEW

## 2018-01-05 LAB — PLATELET COUNT: Platelets: 18 10*3/uL — CL (ref 150–400)

## 2018-01-05 LAB — MAGNESIUM: Magnesium: 1.8 mg/dL (ref 1.7–2.4)

## 2018-01-05 MED ORDER — HYDROXYZINE HCL 10 MG PO TABS: 10.0000 mg | ORAL_TABLET | Freq: Three times a day (TID) | ORAL | Status: DC | PRN

## 2018-01-05 MED ORDER — HYDROXYZINE HCL 10 MG PO TABS
10.0000 mg | ORAL_TABLET | Freq: Three times a day (TID) | ORAL | Status: DC | PRN
  Administered 2018-01-05 – 2018-01-08 (×4): 10 mg via ORAL
  Filled 2018-01-05 (×6): qty 1

## 2018-01-05 NOTE — Progress Notes (Signed)
Referring Physician(s): Ennever,P  Supervising Physician: Markus Daft  Patient Status:  [facility] - In-pt  Chief Complaint: Depression/suicidal ideation   Subjective: Pt familiar to IR service from prior paracenteses, last in Nov 2018. She has a history of sig alcohol abuse with cirrhosis, bipolar disorder/schizophrenia and now presents with sig thrombocytopenia/leukopenia? alcohol vs immune mediated. Request now received for CT guided bone marrow biopsy for further evaluation. She currently denies fever, CP,dyspnea, cough, abd/back pain,N/V. She does have occ HA's and some LE cramping.. No bleeding since admission. Past Medical History:  Diagnosis Date  . Anxiety   . Bipolar affective disorder ([place])    With anxiety features  . Cirrhosis of liver ([place])    Due to alcohol and hepatitis C  . Depression   . GERD (gastroesophageal reflux disease)   . Hepatitis C 2018   hepatitis c and alcohol related hepatitis  . History of blood transfusion    "blood doesn't clot; I fell down and had to have a transfusion"  . History of kidney stones   . Migraine    "when I get really stressed" (09/01/2017)  . Schizophrenia ([employer])   . Seizures ([place])    "when I run out of my RX; lots recently" (09/01/2017)   Past Surgical History:  Procedure Laterality Date  . ESOPHAGOGASTRODUODENOSCOPY N/A 09/03/2017   Procedure: ESOPHAGOGASTRODUODENOSCOPY (EGD);  Surgeon: Doran Stabler, MD;  Location: [place];  Service: Gastroenterology;  Laterality: N/A;  . FINGER FRACTURE SURGERY Left    "shattered my pinky"  . FRACTURE SURGERY    . IR PARACENTESIS  07/23/2017  . IR PARACENTESIS  07/2017   "did it twice in the same week" (09/01/2017)  . SHOULDER OPEN ROTATOR CUFF REPAIR Right   . TUBAL LIGATION    . VAGINAL HYSTERECTOMY       Allergies: Patient has no known allergies.  Medications: Prior to Admission medications   Medication Sig Start Date End Date Taking? Authorizing Provider  busPIRone  (BUSPAR) 7.5 MG tablet Take 1 tablet (7.5 mg total) by mouth 2 (two) times daily. For anxiety 11/02/17  Yes Money, Lowry Ram, FNP  famotidine (PEPCID) 20 MG tablet Take 1 tablet (20 mg total) by mouth 2 (two) times daily. Patient taking differently: Take 20 mg by mouth 2 (two) times daily as needed for heartburn or indigestion.  11/02/17  Yes Money, Lowry Ram, FNP  FLUoxetine (PROZAC) 20 MG capsule Take 1 capsule (20 mg total) by mouth daily. For mood control 11/02/17  Yes Money, Lowry Ram, FNP  furosemide (LASIX) 20 MG tablet Take 1 tablet (20 mg total) by mouth daily. 10/21/17  Yes Scot Jun, FNP  haloperidol (HALDOL) 2 MG tablet Take 2 mg by mouth at bedtime.   Yes [provider]  hydrOXYzine (ATARAX/VISTARIL) 25 MG tablet Take 1 tablet (25 mg total) by mouth 3 (three) times daily as needed for anxiety. 11/02/17  Yes Money, Lowry Ram, FNP  pentoxifylline (TRENTAL) 400 MG CR tablet Take 1 tablet (400 mg total) by mouth 3 (three) times daily with meals. 11/02/17  Yes Money, Lowry Ram, FNP  topiramate (TOPAMAX) 50 MG tablet Take 1 tablet (50 mg total) by mouth daily. 10/21/17  Yes Scot Jun, FNP  traZODone (DESYREL) 100 MG tablet Take 1 tablet (100 mg total) by mouth at bedtime as needed for sleep. 11/02/17  Yes Money, Lowry Ram, FNP  spironolactone (ALDACTONE) 50 MG tablet Take 1 tablet (50 mg total) by mouth daily. Patient not  taking: Reported on 01/01/2018 10/21/17   Scot Jun, FNP     Vital Signs: BP 117/82   Pulse 97   Temp 98.2 F (36.8 C) (Oral)   Resp 15   Ht _0  (1.499 m)   Wt 126 lb 15.8 oz (57.6 kg)   LMP  (LMP Unknown)   SpO2 96%   BMI 25.65 kg/m   Physical Exam awake, sl drowsy but answers all questions; chest- CTA bilat; heart- RRR; abd- soft,+BS,NT; no LE edema  Imaging: Ct Head Wo Contrast  Result Date: 01/03/2018 CLINICAL DATA:  Headache, acute, severe, worst of life. Head pain and nose bleeds for 2 months. EXAM: CT HEAD WITHOUT CONTRAST  TECHNIQUE: Contiguous axial images were obtained from the base of the skull through the vertex without intravenous contrast. COMPARISON:  None. FINDINGS: Brain: No evidence of acute infarction, hemorrhage, hydrocephalus, extra-axial collection or mass lesion/mass effect. Two slices are affected by streak artifact from an earing. Questionable small calcification in the left cerebellum, which would be nonspecific in isolation and incidental to the history. Mild cerebral volume loss. Vascular: No hyperdense vessel or unexpected calcification. Skull: Normal. Negative for fracture or focal lesion. Sinuses/Orbits: No acute finding. IMPRESSION: Negative head CT. Electronically Signed   By: Monte Fantasia M.D.   On: 01/03/2018 18:20   US Abdomen Complete  Result Date: 01/02/2018 CLINICAL DATA:  Initial evaluation for splenomegaly. History of hypertonicity with cirrhosis. EXAM: ABDOMEN ULTRASOUND COMPLETE COMPARISON:  Prior CT from 07/24/2017. FINDINGS: Gallbladder: 1.6 cm calculus present within the gallbladder lumen. Gallbladder wall measure within normal limits of 1.9 mm. No free pericholecystic fluid. No sonographic Murphy sign elicited on exam. Common bile duct: Diameter: 5.5 mm Liver: Liver demonstrates coarse heterogeneous and increased echotexture with nodular contour, consistent with cirrhosis. No focal intrahepatic lesion. Portal vein is patent on color Doppler imaging with normal direction of blood flow towards the liver. IVC: No abnormality visualized. Pancreas: Visualized portion unremarkable. Spleen: Size and appearance within normal limits, measuring 9.8 cm in craniocaudad dimension. Right Kidney: Length: 11.4 cm. Echogenicity within normal limits. No mass or hydronephrosis visualized. Left Kidney: Length: 13.7 cm. Echogenicity within normal limits. No mass or hydronephrosis visualized. Abdominal aorta: No aneurysm visualized. Other findings: None. IMPRESSION: 1. Spleen size within normal limits by  sonography. No evidence for splenomegaly. 2. Hepatic cirrhosis. 3. Cholelithiasis. No sonographic features to suggest acute cholecystitis. No biliary dilatation. Electronically Signed   By: Jeannine Boga M.D.   On: 01/02/2018 22:18    Labs:  CBC: Recent Labs    01/02/18 1935 01/02/18 2257 01/03/18 0417 01/04/18 0456 01/04/18 0851 01/05/18 0504  WBC 1.9* 1.9* 2.3*  --  2.5*  --   HGB 11.6* 10.9* 9.9*  --  11.0*  --   HCT 35.3* 33.4* 30.6*  --  34.0*  --   PLT <5* 7* 19* 17* 17* 18*    COAGS: Recent Labs    07/19/17 1645  08/31/17 2000 09/01/17 0623 09/11/17 1352 09/23/17 1508 01/02/18 2257  INR 1.63   < > 1.29 1.45 1.19 1.20 1.14  APTT 40*  --  35  --   --   --   --    < > = values in this interval not displayed.    BMP: Recent Labs    01/01/18 0355 01/02/18 1935 01/03/18 0417 01/04/18 0851  NA 143 140 141 133*  K 3.3* 3.3* 3.6 3.6  CL 111 107 108 105  CO2 21* 18* 19* 18*  GLUCOSE 111* 141* 86 101*  BUN 5* <5* <5* 7  CALCIUM 8.0* 8.5* 7.9* 9.1  CREATININE 0.60 0.39* 0.33* 0.56  GFRNONAA >60 >60 >60 >60  GFRAA >60 >60 >60 >60    LIVER FUNCTION TESTS: Recent Labs    01/01/18 0355 01/02/18 1935 01/03/18 0417 01/04/18 0851  BILITOT 1.4* 2.0* 1.6* 2.3*  AST 99* 95* 87* 74*  ALT 41 34 30 28  ALKPHOS 247* 196* 170* 203*  PROT 8.7* 8.0 7.1 8.5*  ALBUMIN 3.2* 3.2* 2.8* 3.2*    Assessment and Plan:  Pt with history of sig alcohol abuse with cirrhosis, bipolar disorder/schizophrenia ; now presents with sig thrombocytopenia/leukopenia? alcohol vs immune mediated. Request  received for CT guided bone marrow biopsy for further evaluation. Risks and benefits discussed with the patient including, but not limited to bleeding, infection, damage to adjacent structures or low yield requiring additional tests.  All of the patient's questions were answered, patient is agreeable to proceed. Consent signed and in chart.  Procedure scheduled for tomorrow  am.   Electronically Signed: D. Rowe Robert, PA-C 01/05/2018, 1:46 PM   I spent a total of  20 minutes at the the patient's bedside AND on the patient's hospital floor or unit, greater than 50% of which was counseling/coordinating care for CT guided bone marrow biopsy    Patient ID: Briana Sweeney, female   DOB: [birth year], 47 y.o.   MRN: [id number]

## 2018-01-05 NOTE — Progress Notes (Addendum)
PROGRESS NOTE    Briana Sweeney  ZOX:[id number] DOB: 05/17/1971 DOA: 01/02/2018 PCP: Scot Jun, FNP    Brief Narrative:  Briana Sweeney is a 47 y.o. female with history of alcohol abuse, liver cirrhosis with EGD in December 2018 showing esophageal varices, pancytopenia likely from alcoholism, schizophrenia had presented to the behavioral health with complaints of suicidal thoughts and anxiety. Patient states she has not been taking many of her medications last few weeks as she ran out of it.  Patient admits to drinking vodka.  Patient has had an EGD in December 2018 which showed esophageal varices.      Assessment & Plan:   Principal Problem:   Alcohol abuse with alcohol-induced mood disorder ([place]) Active Problems:   Pancytopenia ([place])   Alcoholic cirrhosis of liver with ascites (HCC)   Schizophrenia (HCC)   Thrombocytopenia ([facility])   Alcohol withdrawal/ alcohol abuse: Watch for with drawals.  On CIWA Protocol.   Suicidal thoughts with h/o schizophrenia:  Psychiatry consulted recommended inpatient psychiatry admission once stable. Appreciate recommendations.  Haldol as needed.  Sitter at bedside.    Rectal bleeding: Sec to thrombocytopenia> stool for occult blood is positive.  No rectal bleed since admission.  Monitor platelets and hemoglobin.    H/o liver cirrhosis and esophageal varices on the last EGD:  - resume lasix, spironolactone on discharge.  - no ascites on exam.    Hypokalemia: replaced.   Acute blood anemia from ? Rectal bleed vs anemia of chronic disease: Baseline hemoglobin around 11.  Currently hemoglobin is around 10.5   Thrombocytopenia:  Suspect secondary to liver cirrhosis .  S/p 1 unit of platelet transfusion and her platelet count improved to 19000 then dropped to 17,000.    Headache with severe thrombocytopenia: Negative. CT head without contrast.  No focal signs.    Pancytopenia:  Possibly from liver cirrhosis.  Counts stable  for now.    DVT prophylaxis: scd's Code Status:  Full code.  Family Communication: none at bedside.  Disposition Plan: to [facility] when platelets improve.    Consultants:   Psychiatry  Hematology.    Procedures: none.   Antimicrobials: rocephin.   Subjective: No new complaints.   Objective: Vitals:   01/04/18 1257 01/04/18 2058 01/05/18 0526 01/05/18 1500  BP: 125/89 (!) 122/91 117/82 (!) 116/91  Pulse: 89 (!) 112 97 99  Resp: 16 15  18   Temp: 98.7 F (37.1 C) 98.3 F (36.8 C) 98.2 F (36.8 C) 98 F (36.7 C)  TempSrc: Oral Oral Oral Oral  SpO2: 98% 99% 96% 99%  Weight:      Height:        Intake/Output Summary (Last 24 hours) at 01/05/2018 1844 Last data filed at 01/05/2018 1400 Gross per 24 hour  Intake 820 ml  Output -  Net 820 ml   Filed Weights   01/02/18 2032 01/02/18 2326  Weight: 59 kg (130 lb) 57.6 kg (126 lb 15.8 oz)    Examination: unchanged.   General exam: Appears calm and comfortable not in distress.  Respiratory system: Clear to auscultation. Respiratory effort normal. No wheezing or rhonchi.  Cardiovascular system: S1 & S2 heard, RRR. No JVD,  No pedal edema. Gastrointestinal system: Abdomen is soft non tender non distended bowel sounds heard.  Central nervous system: Alert and oriented. Non focal.  Extremities: Symmetric 5 x 5 power. No cyanosis or clubbing.  Skin: No rashes, lesions or ulcers Psychiatry:  Mood & affect appropriate.     Data Reviewed:  I have personally reviewed following labs and imaging studies  CBC: Recent Labs  Lab 01/01/18 0355 01/02/18 1935 01/02/18 2257 01/03/18 0417 01/04/18 0456 01/04/18 0851 01/05/18 0504  WBC 3.1* 1.9* 1.9* 2.3*  --  2.5*  --   NEUTROABS  --   --  0.7* 1.0*  --  0.9*  --   HGB 11.9* 11.6* 10.9* 9.9*  --  11.0*  --   HCT 37.6 35.3* 33.4* 30.6*  --  34.0*  --   MCV 91.9 90.5 89.5 90.8  --  90.9  --   PLT 13* <5* 7* 19* 17* 17* 18*   Basic Metabolic Panel: Recent Labs  Lab  01/01/18 0355 01/02/18 1935 01/03/18 0417 01/04/18 0851 01/05/18 0504  NA 143 140 141 133*  --   K 3.3* 3.3* 3.6 3.6  --   CL 111 107 108 105  --   CO2 21* 18* 19* 18*  --   GLUCOSE 111* 141* 86 101*  --   BUN 5* <5* <5* 7  --   CREATININE 0.60 0.39* 0.33* 0.56  --   CALCIUM 8.0* 8.5* 7.9* 9.1  --   MG 1.7  --   --   --  1.8   GFR: Estimated Creatinine Clearance: 68.7 mL/min (by C-G formula based on SCr of 0.56 mg/dL). Liver Function Tests: Recent Labs  Lab 01/01/18 0355 01/02/18 1935 01/03/18 0417 01/04/18 0851  AST 99* 95* 87* 74*  ALT 41 34 30 28  ALKPHOS 247* 196* 170* 203*  BILITOT 1.4* 2.0* 1.6* 2.3*  PROT 8.7* 8.0 7.1 8.5*  ALBUMIN 3.2* 3.2* 2.8* 3.2*   No results for input(s): LIPASE, AMYLASE in the last 168 hours. No results for input(s): AMMONIA in the last 168 hours. Coagulation Profile: Recent Labs  Lab 01/02/18 2257  INR 1.14   Cardiac Enzymes: No results for input(s): CKTOTAL, CKMB, CKMBINDEX, TROPONINI in the last 168 hours. BNP (last 3 results) No results for input(s): PROBNP in the last 8760 hours. HbA1C: No results for input(s): HGBA1C in the last 72 hours. CBG: No results for input(s): GLUCAP in the last 168 hours. Lipid Profile: No results for input(s): CHOL, HDL, LDLCALC, TRIG, CHOLHDL, LDLDIRECT in the last 72 hours. Thyroid Function Tests: No results for input(s): TSH, T4TOTAL, FREET4, T3FREE, THYROIDAB in the last 72 hours. Anemia Panel: Recent Labs    01/02/18 2257  VITAMINB12 343   Sepsis Labs: No results for input(s): PROCALCITON, LATICACIDVEN in the last 168 hours.  No results found for this or any previous visit (from the past 240 hour(s)).       Radiology Studies: No results found.      Scheduled Meds: . busPIRone  7.5 mg Oral BID  . feeding supplement  1 Container Oral TID BM  . FLUoxetine  20 mg Oral Daily  . folic acid  1 mg Oral Daily  . gabapentin  100 mg Oral TID  . haloperidol  2 mg Oral QHS  .  LORazepam  0-4 mg Intravenous Q12H  . multivitamin with minerals  1 tablet Oral Daily  . thiamine  100 mg Oral Daily   Or  . thiamine  100 mg Intravenous Daily  . topiramate  50 mg Oral Daily  . traZODone  50 mg Oral QHS   Continuous Infusions: . cefTRIAXone (ROCEPHIN)  IV Stopped (01/04/18 2155)     LOS: 3 days    Time spent: 35 minutes.     Hosie Poisson, MD [facility]  Pager [phone]   If 7PM-7AM, please contact night-coverage [email or web address] Password [facility] 01/05/2018, 6:44 PM

## 2018-01-05 NOTE — Progress Notes (Signed)
[name] appears to be doing okay.  She was alcohol toxic when I saw her on Friday.  She does have some psychiatric issues.  She had a profound thrombocytopenia.  She was on Trental for her liver.  She did receive some platelets as a transfusion.  I do not know if the thrombocytopenia is from alcohol toxicity to the bone marrow or if this is from immune-based issues.  As such, I think we are going to have to put her through a bone marrow biopsy.  Today, her platelet count is 18,000.  I think a bone marrow biopsy would be incredibly helpful for [place] to distinguish the etiology of her thrombocytopenia.  If the bone marrow is filled with megakaryocytes, then I think we are looking at immune-based thrombocytopenia (i.e. ITP).  If the bone marrow has very few megakaryocytes, then I think we are looking at alcohol poisoning.  There are no labs back to the outside of the platelet count.  She does have some leukopenia.  She had an ultrasound of the abdomen done on Friday.  She had normal spleen size.  She had hepatic cirrhosis.  I talked to her about the bone marrow biopsy.  I explained to her how it was done.  Radiology will do it for [place].  She agrees to have it done.  For right now, I do not have any other recommendations for her thrombocytopenia.  I think since she is not bleeding, we can just watch this.  The bone marrow will really allow [place] to make management decisions for her thrombocytopenia.  Lattie Haw, MD  [address] [place] 3:9

## 2018-01-06 ENCOUNTER — Inpatient Hospital Stay (HOSPITAL_COMMUNITY): Payer: Self-pay

## 2018-01-06 ENCOUNTER — Encounter (HOSPITAL_COMMUNITY): Payer: Self-pay | Admitting: Radiology

## 2018-01-06 DIAGNOSIS — D61818 Other pancytopenia: Secondary | ICD-10-CM | POA: Diagnosis not present

## 2018-01-06 DIAGNOSIS — R443 Hallucinations, unspecified: Secondary | ICD-10-CM | POA: Diagnosis not present

## 2018-01-06 DIAGNOSIS — F1014 Alcohol abuse with alcohol-induced mood disorder: Secondary | ICD-10-CM | POA: Diagnosis not present

## 2018-01-06 DIAGNOSIS — D649 Anemia, unspecified: Secondary | ICD-10-CM | POA: Diagnosis not present

## 2018-01-06 LAB — CBC WITH DIFFERENTIAL/PLATELET
Basophils Absolute: 0 10*3/uL (ref 0.0–0.1)
Basophils Relative: 0 %
Eosinophils Absolute: 0.1 10*3/uL (ref 0.0–0.7)
Eosinophils Relative: 3 %
HCT: 33 % — ABNORMAL LOW (ref 36.0–46.0)
Hemoglobin: 10.5 g/dL — ABNORMAL LOW (ref 12.0–15.0)
Lymphocytes Relative: 37 %
Lymphs Abs: 1 10*3/uL (ref 0.7–4.0)
MCH: 29.6 pg (ref 26.0–34.0)
MCHC: 31.8 g/dL (ref 30.0–36.0)
MCV: 93 fL (ref 78.0–100.0)
Monocytes Absolute: 0.5 10*3/uL (ref 0.1–1.0)
Monocytes Relative: 18 %
Neutro Abs: 1.1 10*3/uL — ABNORMAL LOW (ref 1.7–7.7)
Neutrophils Relative %: 42 %
Platelets: 17 10*3/uL — CL (ref 150–400)
RBC: 3.55 MIL/uL — ABNORMAL LOW (ref 3.87–5.11)
RDW: 19.5 % — ABNORMAL HIGH (ref 11.5–15.5)
WBC: 2.7 10*3/uL — ABNORMAL LOW (ref 4.0–10.5)

## 2018-01-06 MED ORDER — FENTANYL CITRATE (PF) 100 MCG/2ML IJ SOLN
INTRAMUSCULAR | Status: AC
  Filled 2018-01-06: qty 2

## 2018-01-06 MED ORDER — FENTANYL CITRATE (PF) 100 MCG/2ML IJ SOLN
INTRAMUSCULAR | Status: AC | PRN
  Administered 2018-01-06: 50 ug via INTRAVENOUS

## 2018-01-06 MED ORDER — MIDAZOLAM HCL 2 MG/2ML IJ SOLN
INTRAMUSCULAR | Status: AC
  Filled 2018-01-06: qty 4

## 2018-01-06 MED ORDER — LIDOCAINE HCL (PF) 1 % IJ SOLN
INTRAMUSCULAR | Status: AC | PRN
  Administered 2018-01-06: 30 mL

## 2018-01-06 MED ORDER — SODIUM CHLORIDE 0.9 % IV SOLN
INTRAVENOUS | Status: AC
  Filled 2018-01-06: qty 250

## 2018-01-06 MED ORDER — MIDAZOLAM HCL 2 MG/2ML IJ SOLN
INTRAMUSCULAR | Status: AC | PRN
  Administered 2018-01-06: 1 mg via INTRAVENOUS

## 2018-01-06 NOTE — Progress Notes (Signed)
Visitor at bedside made aware that un searched bags are not allowed at the bedside. Bags will need to be scanned by security or left outside of the room.

## 2018-01-06 NOTE — Clinical Social Work Note (Signed)
Clinical Social Work Assessment  Patient Details  Name: Briana Sweeney MRN: [id number] Date of Birth: 03-Apr-1971  Date of referral:  01/06/18               Reason for consult:  Facility Placement                Permission sought to share information with:    Permission granted to share information::  No  Name::        Agency::     Relationship::     Contact Information:     Housing/Transportation Living arrangements for the past 2 months:  Single Family Home Source of Information:  Patient Patient Interpreter Needed:  None Criminal Activity/Legal Involvement Pertinent to Current Situation/Hospitalization:  No - Comment as needed Significant Relationships:  Friend Lives with:  Friends Do you feel safe going back to the place where you live?  Yes Need for family participation in patient care:  Yes (Comment)  Care giving concerns:  No care giving concerns at the time of assessment.    Social Worker assessment / plan:  LCSW consulted for inpatient psych placement.   Patient admitted for SI.  LCSW met at bedside with patient. Patient has a Actuary. No supports present.   Patient reports that she was most recently hospitalized 3 months ago for 5 days. Patient states she has been inpatient 2x in the past. Patient reports that she is currently seen at [facility] for outpatient therapy and med management.   Patient states that she is currently unemployed and in the process of applying for disability. Patient reports that she currently lives with friends and has been there for the past for months. Patient reports that she has not lived own her own since her boyfriend passed before Christmas.   Patient reports that she has the support of friends, but no family support. Patient states that she has a sister and daughter in the area that she does not see much.   Patient is agreeable to inpatient psych at dc.   PLAN: Patient will go inpatient psych at dc. Patient is voluntary.   Employment  status:  Unemployed Forensic scientist:  Self Pay (Medicaid Pending) PT Recommendations:  Not assessed at this time Information / Referral to community resources:  Inpatient Psychiatric Care (Comment Required)  Patient/Family's Response to care:  Patient is thankful for LCSW visit.   Patient/Family's Understanding of and Emotional Response to Diagnosis, Current Treatment, and Prognosis:  Patient is understanding of current diagnosis and treatment plan. Though patient states she would prefer not to go inpatient because she has a lot going on outside of the hospital, patient is agreeable. Patient asked questions about voluntary vs IVC. Patient states she was IVC'd on he last hospitalization. Patient states she is willing to go voluntary at the time of assessment.   Emotional Assessment Appearance:  Appears stated age Attitude/Demeanor/Rapport:    Affect (typically observed):  Accepting, Calm Orientation:  Oriented to Self, Oriented to Place, Oriented to Situation, Oriented to  Time Alcohol / Substance use:  Illicit Drugs Psych involvement (Current and /or in the community):  Outpatient Provider  Discharge Needs  Concerns to be addressed:  Financial / Insurance Concerns, Substance Abuse Concerns, Mental Health Concerns Readmission within the last 30 days:  No Current discharge risk:  None Barriers to Discharge:  Continued Medical Work up   [employer], LCSW 01/06/2018, 2:45 PM

## 2018-01-06 NOTE — Progress Notes (Signed)
PROGRESS NOTE    Briana Sweeney  QPY:[id number] DOB: Nov 09, 1970 DOA: 01/02/2018 PCP: Briana Jun, FNP    Brief Narrative:  Briana Sweeney is a 47 y.o. female with history of alcohol abuse, liver cirrhosis with EGD in December 2018 showing esophageal varices, pancytopenia likely from alcoholism, schizophrenia had presented to the behavioral health with complaints of suicidal thoughts and anxiety. Patient states she has not been taking many of her medications last few weeks as she ran out of it.  Patient admits to drinking vodka.  Patient has had an EGD in December 2018 which showed esophageal varices.      Assessment & Plan:   Principal Problem:   Alcohol abuse with alcohol-induced mood disorder ([place]) Active Problems:   Pancytopenia ([place])   Alcoholic cirrhosis of liver with ascites (HCC)   Schizophrenia (HCC)   Thrombocytopenia (HCC)   Alcohol withdrawal/ alcohol abuse: Watch for with drawals.  On CIWA Protocol.  No signs of withdrawals for now.   Suicidal thoughts with h/o schizophrenia:  Psychiatry consulted recommended inpatient psychiatry admission once stable.patient wanted to go home.  Appreciate recommendations.  Haldol as needed.  Sitter at bedside.    Rectal bleeding: Sec to thrombocytopenia> stool for occult blood is positive.  No rectal bleed since admission.  Monitor platelets and hemoglobin.    H/o liver cirrhosis and esophageal varices on the last EGD:  - resume lasix, spironolactone on discharge.  - no ascites on exam.    Hypokalemia: replaced.   Acute blood anemia from ? Rectal bleed vs anemia of chronic disease: Baseline hemoglobin around 11.  Currently hemoglobin is around 10.5   Thrombocytopenia:  Suspect secondary to liver cirrhosis .  S/p 1 unit of platelet transfusion and her platelet count improved to 19000 then dropped to 17,000.  She underwent CT guided biopsy of the bone marrow on 4/30 and appreciate hematology recommendations.     Headache with severe thrombocytopenia: Negative. CT head without contrast.  No focal signs.    Pancytopenia:  Possibly from liver cirrhosis.  Counts stable for now.    DVT prophylaxis: scd's Code Status:  Full code.  Family Communication: none at bedside.  Disposition Plan: to [facility] when platelets improve.    Consultants:   Psychiatry  Hematology.    Procedures: none.   Antimicrobials: rocephin.   Subjective: No new complaints. Reports feeling better and wants to go home from here.   Objective: Vitals:   01/06/18 0937 01/06/18 0952 01/06/18 1028 01/06/18 1104  BP: 95/76 108/79 99/67 (!) 86/69  Pulse: 80 90 81 87  Resp: '18 18 18 17  '$ Temp: (!) 97.5 F (36.4 C) (!) 97.5 F (36.4 C) (!) 97.5 F (36.4 C) 98.3 F (36.8 C)  TempSrc:  Oral Oral Oral  SpO2: 98% 99% 99% 99%  Weight:      Height:        Intake/Output Summary (Last 24 hours) at 01/06/2018 1510 Last data filed at 01/06/2018 1051 Gross per 24 hour  Intake 600 ml  Output -  Net 600 ml   Filed Weights   01/02/18 2032 01/02/18 2326 01/05/18 2317  Weight: 59 kg (130 lb) 57.6 kg (126 lb 15.8 oz) 56.9 kg (125 lb 7.1 oz)    Examination:   General exam: calm and comfortable, no distress.  Respiratory system: good air entry bilateral, no wheezing or rhonchi.  Cardiovascular system: S1 & S2 heard, RRR. No JVD,  No pedal edema. Gastrointestinal system: Abdomen is soft non tender non distended  bowel sounds heard.  Central nervous system: Alert and oriented. Non focal.  Extremities: Symmetric 5 x 5 power. No cyanosis or clubbing.  Skin: No rashes, lesions or ulcers Psychiatry:  Mood & affect appropriate. No suicidal ideation.     Data Reviewed: I have personally reviewed following labs and imaging studies  CBC: Recent Labs  Lab 01/02/18 1935 01/02/18 2257 01/03/18 0417 01/04/18 0456 01/04/18 0851 01/05/18 0504 07-Jan-2018 0434  WBC 1.9* 1.9* 2.3*  --  2.5*  --  2.7*  NEUTROABS  --  0.7* 1.0*   --  0.9*  --  1.1*  HGB 11.6* 10.9* 9.9*  --  11.0*  --  10.5*  HCT 35.3* 33.4* 30.6*  --  34.0*  --  33.0*  MCV 90.5 89.5 90.8  --  90.9  --  93.0  PLT <5* 7* 19* 17* 17* 18* 17*   Basic Metabolic Panel: Recent Labs  Lab 01/01/18 0355 01/02/18 1935 01/03/18 0417 01/04/18 0851 01/05/18 0504  NA 143 140 141 133*  --   K 3.3* 3.3* 3.6 3.6  --   CL 111 107 108 105  --   CO2 21* 18* 19* 18*  --   GLUCOSE 111* 141* 86 101*  --   BUN 5* <5* <5* 7  --   CREATININE 0.60 0.39* 0.33* 0.56  --   CALCIUM 8.0* 8.5* 7.9* 9.1  --   MG 1.7  --   --   --  1.8   GFR: Estimated Creatinine Clearance: 68.3 mL/min (by C-G formula based on SCr of 0.56 mg/dL). Liver Function Tests: Recent Labs  Lab 01/01/18 0355 01/02/18 1935 01/03/18 0417 01/04/18 0851  AST 99* 95* 87* 74*  ALT 41 34 30 28  ALKPHOS 247* 196* 170* 203*  BILITOT 1.4* 2.0* 1.6* 2.3*  PROT 8.7* 8.0 7.1 8.5*  ALBUMIN 3.2* 3.2* 2.8* 3.2*   No results for input(s): LIPASE, AMYLASE in the last 168 hours. No results for input(s): AMMONIA in the last 168 hours. Coagulation Profile: Recent Labs  Lab 01/02/18 2257  INR 1.14   Cardiac Enzymes: No results for input(s): CKTOTAL, CKMB, CKMBINDEX, TROPONINI in the last 168 hours. BNP (last 3 results) No results for input(s): PROBNP in the last 8760 hours. HbA1C: No results for input(s): HGBA1C in the last 72 hours. CBG: No results for input(s): GLUCAP in the last 168 hours. Lipid Profile: No results for input(s): CHOL, HDL, LDLCALC, TRIG, CHOLHDL, LDLDIRECT in the last 72 hours. Thyroid Function Tests: No results for input(s): TSH, T4TOTAL, FREET4, T3FREE, THYROIDAB in the last 72 hours. Anemia Panel: No results for input(s): VITAMINB12, FOLATE, FERRITIN, TIBC, IRON, RETICCTPCT in the last 72 hours. Sepsis Labs: No results for input(s): PROCALCITON, LATICACIDVEN in the last 168 hours.  No results found for this or any previous visit (from the past 240 hour(s)).        Radiology Studies: Ct Biopsy  Result Date: 01/07/2018 INDICATION: 47 year old with thrombocytopenia. EXAM: CT GUIDED BONE MARROW ASPIRATES AND BIOPSY Physician: Stephan Minister. Anselm Pancoast, MD MEDICATIONS: None. ANESTHESIA/SEDATION: Fentanyl 100 mcg IV; Versed 2.0 mg IV Moderate Sedation Time:  10 minutes The patient was continuously monitored during the procedure by the interventional radiology nurse under my direct supervision. COMPLICATIONS: None immediate. PROCEDURE: The procedure was explained to the patient. The risks and benefits of the procedure were discussed and the patient's questions were addressed. Informed consent was obtained from the patient. The patient was placed prone on CT table. Images of the pelvis  were obtained. The right side of back was prepped and draped in sterile fashion. The skin and right posterior ilium were anesthetized with 1% lidocaine. 11 gauge bone needle was directed into the right ilium with CT guidance. Two aspirates and one core biopsy were obtained. Bandage placed over the puncture site. IMPRESSION: CT guided bone marrow aspiration and core biopsy. Electronically Signed   By: Markus Daft M.D.   On: 01/06/2018 09:08   Ct Bone Marrow Biopsy & Aspiration  Result Date: 01/06/2018 INDICATION: 47 year old with thrombocytopenia. EXAM: CT GUIDED BONE MARROW ASPIRATES AND BIOPSY Physician: Stephan Minister. Anselm Pancoast, MD MEDICATIONS: None. ANESTHESIA/SEDATION: Fentanyl 100 mcg IV; Versed 2.0 mg IV Moderate Sedation Time:  10 minutes The patient was continuously monitored during the procedure by the interventional radiology nurse under my direct supervision. COMPLICATIONS: None immediate. PROCEDURE: The procedure was explained to the patient. The risks and benefits of the procedure were discussed and the patient's questions were addressed. Informed consent was obtained from the patient. The patient was placed prone on CT table. Images of the pelvis were obtained. The right side of back was prepped  and draped in sterile fashion. The skin and right posterior ilium were anesthetized with 1% lidocaine. 11 gauge bone needle was directed into the right ilium with CT guidance. Two aspirates and one core biopsy were obtained. Bandage placed over the puncture site. IMPRESSION: CT guided bone marrow aspiration and core biopsy. Electronically Signed   By: Markus Daft M.D.   On: 01/06/2018 09:08        Scheduled Meds: . busPIRone  7.5 mg Oral BID  . feeding supplement  1 Container Oral TID BM  . fentaNYL      . FLUoxetine  20 mg Oral Daily  . folic acid  1 mg Oral Daily  . gabapentin  100 mg Oral TID  . haloperidol  2 mg Oral QHS  . midazolam      . multivitamin with minerals  1 tablet Oral Daily  . thiamine  100 mg Oral Daily   Or  . thiamine  100 mg Intravenous Daily  . topiramate  50 mg Oral Daily  . traZODone  50 mg Oral QHS   Continuous Infusions: . cefTRIAXone (ROCEPHIN)  IV Stopped (01/05/18 2258)  . sodium chloride       LOS: 4 days    Time spent: 35 minutes.     Hosie Poisson, MD [facility] Pager [phone]   If 7PM-7AM, please contact night-coverage [email or web address] Password [email or web address] 01/06/2018, 3:10 PM

## 2018-01-06 NOTE — Procedures (Signed)
CT guided bone marrow biopsy.  2 aspirates and 1 core biopsy.  Minimal blood loss and no immediate complication.  

## 2018-01-07 DIAGNOSIS — D696 Thrombocytopenia, unspecified: Secondary | ICD-10-CM | POA: Diagnosis not present

## 2018-01-07 DIAGNOSIS — D72819 Decreased white blood cell count, unspecified: Secondary | ICD-10-CM | POA: Diagnosis not present

## 2018-01-07 DIAGNOSIS — R45851 Suicidal ideations: Secondary | ICD-10-CM

## 2018-01-07 DIAGNOSIS — F1014 Alcohol abuse with alcohol-induced mood disorder: Secondary | ICD-10-CM | POA: Diagnosis not present

## 2018-01-07 DIAGNOSIS — D61818 Other pancytopenia: Secondary | ICD-10-CM | POA: Diagnosis not present

## 2018-01-07 DIAGNOSIS — K7031 Alcoholic cirrhosis of liver with ascites: Secondary | ICD-10-CM | POA: Diagnosis not present

## 2018-01-07 DIAGNOSIS — E876 Hypokalemia: Secondary | ICD-10-CM | POA: Diagnosis not present

## 2018-01-07 LAB — PROTIME-INR
INR: 1.17
Prothrombin Time: 14.8 seconds (ref 11.4–15.2)

## 2018-01-07 LAB — BASIC METABOLIC PANEL
Anion gap: 8 (ref 5–15)
BUN: 11 mg/dL (ref 6–20)
CO2: 21 mmol/L — ABNORMAL LOW (ref 22–32)
Calcium: 8.7 mg/dL — ABNORMAL LOW (ref 8.9–10.3)
Chloride: 108 mmol/L (ref 101–111)
Creatinine, Ser: 0.56 mg/dL (ref 0.44–1.00)
GFR calc Af Amer: >60 mL/min (ref >60)
GFR calc non Af Amer: >60 mL/min (ref >60)
Glucose, Bld: 153 mg/dL — ABNORMAL HIGH (ref 65–99)
Potassium: 3.6 mmol/L (ref 3.5–5.1)
Sodium: 137 mmol/L (ref 135–145)

## 2018-01-07 LAB — CBC WITH DIFFERENTIAL/PLATELET
Basophils Absolute: 0 10*3/uL (ref 0.0–0.1)
Basophils Relative: 0 %
Eosinophils Absolute: 0.1 10*3/uL (ref 0.0–0.7)
Eosinophils Relative: 2 %
HCT: 32.5 % — ABNORMAL LOW (ref 36.0–46.0)
Hemoglobin: 10.3 g/dL — ABNORMAL LOW (ref 12.0–15.0)
Lymphocytes Relative: 33 %
Lymphs Abs: 1 10*3/uL (ref 0.7–4.0)
MCH: 29.8 pg (ref 26.0–34.0)
MCHC: 31.7 g/dL (ref 30.0–36.0)
MCV: 93.9 fL (ref 78.0–100.0)
Monocytes Absolute: 0.5 10*3/uL (ref 0.1–1.0)
Monocytes Relative: 16 %
Neutro Abs: 1.5 10*3/uL — ABNORMAL LOW (ref 1.7–7.7)
Neutrophils Relative %: 49 %
Platelets: 17 10*3/uL — CL (ref 150–400)
RBC: 3.46 MIL/uL — ABNORMAL LOW (ref 3.87–5.11)
RDW: 20 % — ABNORMAL HIGH (ref 11.5–15.5)
WBC: 3 10*3/uL — ABNORMAL LOW (ref 4.0–10.5)

## 2018-01-07 LAB — HEPATIC FUNCTION PANEL
ALT: 20 U/L (ref 14–54)
AST: 43 U/L — ABNORMAL HIGH (ref 15–41)
Albumin: 2.8 g/dL — ABNORMAL LOW (ref 3.5–5.0)
Alkaline Phosphatase: 163 U/L — ABNORMAL HIGH (ref 38–126)
Bilirubin, Direct: 0.4 mg/dL (ref 0.1–0.5)
Indirect Bilirubin: 0.5 mg/dL (ref 0.3–0.9)
Total Bilirubin: 0.9 mg/dL (ref 0.3–1.2)
Total Protein: 7.6 g/dL (ref 6.5–8.1)

## 2018-01-07 LAB — MAGNESIUM: Magnesium: 1.7 mg/dL (ref 1.7–2.4)

## 2018-01-07 MED ORDER — BUSPIRONE HCL 5 MG PO TABS
10.0000 mg | ORAL_TABLET | Freq: Two times a day (BID) | ORAL | Status: DC
  Administered 2018-01-07 – 2018-01-08 (×2): 10 mg via ORAL
  Filled 2018-01-07 (×2): qty 2

## 2018-01-07 MED ORDER — MAGNESIUM SULFATE 4 GM/100ML IV SOLN
4.0000 g | Freq: Once | INTRAVENOUS | Status: AC
  Administered 2018-01-07: 4 g via INTRAVENOUS
  Filled 2018-01-07: qty 100

## 2018-01-07 MED ORDER — SENNOSIDES-DOCUSATE SODIUM 8.6-50 MG PO TABS
1.0000 | ORAL_TABLET | Freq: Every day | ORAL | Status: DC | PRN
  Administered 2018-01-08: 1 via ORAL
  Filled 2018-01-07 (×2): qty 1

## 2018-01-07 NOTE — Progress Notes (Addendum)
PROGRESS NOTE    Briana Sweeney  JQB:[id number] DOB: December 22, 1970 DOA: 01/02/2018 PCP: Scot Jun, FNP    Brief Narrative:  Briana Sweeney a 47 y.o.femalewithhistory of alcohol abuse, liver cirrhosis with EGD in December 2018 showing esophageal varices, pancytopenia likely from alcoholism, schizophrenia had presented to the behavioral health with complaints of suicidal thoughts and anxiety. Patient states she has not been taking many of her medications last few weeks as she ran out of it. Patient admits to drinking vodka. Patient has had an EGD in December 2018 which showed esophageal varices.     Assessment & Plan:   Principal Problem:   Alcohol abuse with alcohol-induced mood disorder (HCC) Active Problems:   Pancytopenia ([place])   Alcoholic cirrhosis of liver with ascites (HCC)   Schizophrenia (HCC)   Thrombocytopenia (HCC)   Suicidal ideation  #1 history of alcohol abuse Patient with no signs of alcohol withdrawal at this time.  Patient was on the Ativan withdrawal protocol.  Continue folic acid, multivitamin, thiamine.  Alcohol cessation stressed to patient.  2.  Depression/history of schizophrenia/suicidal ideations Patient noted to have recently been discharged from behavioral health February 2019 however noncompliant with her medications and I presented with suicidal ideation.  Patient seen in consultation by psychiatry who recommended starting patient on gabapentin, Prozac.  Psychiatry recommending inpatient psychiatric admission once patient is medically stable.  Patient complained of increased anxiety and as such we will increase her BuSpar to 10 mg twice daily.  Patient wanting to go home we will have psychiatry reassess.  Will follow.  3.  Rectal bleeding Felt secondary to thrombocytopenia.  Hemoccult blood was positive.  No rectal bleeding since admission.  Platelet count currently stable at 17,000.  Hemoglobin stable at 10.3.  Follow.  4.  Pancytopenia Likely  secondary to alcoholic cirrhosis.  Patient denies any overt bleeding.  Patient seen in consultation by Dr. Marin Olp of hematology/oncology patient underwent a CT-guided bone marrow biopsy with results pending.  Per oncology.  Follow.  5.  Alcoholic cirrhosis/esophageal varices by last EGD Currently stable.  Patient with pancytopenia.  Patient with no overt bleeding.  Magnesium level at 1.7 will replete to keep magnesium at 2.  Continue IV Rocephin through today and discontinue tomorrow.  Blood pressure has been borderline and as such Lasix and spironolactone currently on hold.  Resume diuretics on discharge.  Supportive care.  Follow.  6.  Hypokalemia Repleted.  7.  Headache Improved.  CT head negative.  Follow.   DVT prophylaxis: SCDs Code Status: Full Family Communication: updated patient.  No family at bedside. Disposition Plan: Likely inpatient psychiatry when medically stable.   Consultants:   Hematology/oncology Dr. Marin Olp 01/02/2018  Psychiatry: Dr. Darleene Cleaver 01/04/2018  Procedures:   CT head 01/03/2018  CT Guided bone marrow aspiration and core biopsy 01/06/2018 per Dr. Anselm Pancoast  Abdominal ultrasound 01/02/2018  Antimicrobials:   IV Rocephin 01/03/2018   Subjective: Complaining of feeling anxious at night and asking to be placed on anxiolytics at bedtime instead of Ambien.  Denies any chest pain or shortness of breath.  Asking when she is going to be able to go home.  Denies any bleeding.  Objective: Vitals:   01/07/18 0622 01/07/18 1122 01/07/18 1258 01/07/18 2022  BP: 103/75 107/75 95/65 112/84  Pulse: (!) 104 99 90 93  Resp: '18  15 16  '$ Temp: 98.3 F (36.8 C)  97.8 F (36.6 C) 98 F (36.7 C)  TempSrc: Oral  Oral Oral  SpO2: 98%  98%  100%  Weight:      Height:       No intake or output data in the 24 hours ending 01/07/18 2044 Filed Weights   01/02/18 2032 01/02/18 2326 01/05/18 2317  Weight: 59 kg (130 lb) 57.6 kg (126 lb 15.8 oz) 56.9 kg (125 lb 7.1 oz)     Examination:  General exam: Appears calm and comfortable  Respiratory system: Clear to auscultation. Respiratory effort normal. Cardiovascular system: S1 & S2 heard, RRR. No JVD, murmurs, rubs, gallops or clicks. No pedal edema. Gastrointestinal system: Abdomen is nondistended, soft and nontender. No organomegaly or masses felt. Normal bowel sounds heard. Central nervous system: Alert and oriented. No focal neurological deficits. Extremities: Symmetric 5 x 5 power. Skin: No rashes, lesions or ulcers Psychiatry: Judgement and insight appear normal. Mood & affect appropriate.     Data Reviewed: I have personally reviewed following labs and imaging studies  CBC: Recent Labs  Lab 01/02/18 2257 01/03/18 0417 01/04/18 0456 01/04/18 0851 01/05/18 0504 01/06/18 0434 01/07/18 0814  WBC 1.9* 2.3*  --  2.5*  --  2.7* 3.0*  NEUTROABS 0.7* 1.0*  --  0.9*  --  1.1* 1.5*  HGB 10.9* 9.9*  --  11.0*  --  10.5* 10.3*  HCT 33.4* 30.6*  --  34.0*  --  33.0* 32.5*  MCV 89.5 90.8  --  90.9  --  93.0 93.9  PLT 7* 19* 17* 17* 18* 17* 17*   Basic Metabolic Panel: Recent Labs  Lab 01/01/18 0355 01/02/18 1935 01/03/18 0417 01/04/18 0851 01/05/18 0504 01/07/18 0935  NA 143 140 141 133*  --  137  K 3.3* 3.3* 3.6 3.6  --  3.6  CL 111 107 108 105  --  108  CO2 21* 18* 19* 18*  --  21*  GLUCOSE 111* 141* 86 101*  --  153*  BUN 5* <5* <5* 7  --  11  CREATININE 0.60 0.39* 0.33* 0.56  --  0.56  CALCIUM 8.0* 8.5* 7.9* 9.1  --  8.7*  MG 1.7  --   --   --  1.8 1.7   GFR: Estimated Creatinine Clearance: 68.3 mL/min (by C-G formula based on SCr of 0.56 mg/dL). Liver Function Tests: Recent Labs  Lab 01/01/18 0355 01/02/18 1935 01/03/18 0417 01/04/18 0851 01/07/18 0935  AST 99* 95* 87* 74* 43*  ALT 41 34 '30 28 20  '$ ALKPHOS 247* 196* 170* 203* 163*  BILITOT 1.4* 2.0* 1.6* 2.3* 0.9  PROT 8.7* 8.0 7.1 8.5* 7.6  ALBUMIN 3.2* 3.2* 2.8* 3.2* 2.8*   No results for input(s): LIPASE, AMYLASE in  the last 168 hours. No results for input(s): AMMONIA in the last 168 hours. Coagulation Profile: Recent Labs  Lab 01/02/18 2257 01/07/18 0935  INR 1.14 1.17   Cardiac Enzymes: No results for input(s): CKTOTAL, CKMB, CKMBINDEX, TROPONINI in the last 168 hours. BNP (last 3 results) No results for input(s): PROBNP in the last 8760 hours. HbA1C: No results for input(s): HGBA1C in the last 72 hours. CBG: No results for input(s): GLUCAP in the last 168 hours. Lipid Profile: No results for input(s): CHOL, HDL, LDLCALC, TRIG, CHOLHDL, LDLDIRECT in the last 72 hours. Thyroid Function Tests: No results for input(s): TSH, T4TOTAL, FREET4, T3FREE, THYROIDAB in the last 72 hours. Anemia Panel: No results for input(s): VITAMINB12, FOLATE, FERRITIN, TIBC, IRON, RETICCTPCT in the last 72 hours. Sepsis Labs: No results for input(s): PROCALCITON, LATICACIDVEN in the last 168 hours.  No results  found for this or any previous visit (from the past 240 hour(s)).       Radiology Studies: Ct Biopsy  Result Date: 01/13/18 INDICATION: 47 year old with thrombocytopenia. EXAM: CT GUIDED BONE MARROW ASPIRATES AND BIOPSY Physician: Stephan Minister. Anselm Pancoast, MD MEDICATIONS: None. ANESTHESIA/SEDATION: Fentanyl 100 mcg IV; Versed 2.0 mg IV Moderate Sedation Time:  10 minutes The patient was continuously monitored during the procedure by the interventional radiology nurse under my direct supervision. COMPLICATIONS: None immediate. PROCEDURE: The procedure was explained to the patient. The risks and benefits of the procedure were discussed and the patient's questions were addressed. Informed consent was obtained from the patient. The patient was placed prone on CT table. Images of the pelvis were obtained. The right side of back was prepped and draped in sterile fashion. The skin and right posterior ilium were anesthetized with 1% lidocaine. 11 gauge bone needle was directed into the right ilium with CT guidance. Two  aspirates and one core biopsy were obtained. Bandage placed over the puncture site. IMPRESSION: CT guided bone marrow aspiration and core biopsy. Electronically Signed   By: Markus Daft M.D.   On: 2018/01/13 09:08   Ct Bone Marrow Biopsy & Aspiration  Result Date: 2018-01-13 INDICATION: 47 year old with thrombocytopenia. EXAM: CT GUIDED BONE MARROW ASPIRATES AND BIOPSY Physician: Stephan Minister. Anselm Pancoast, MD MEDICATIONS: None. ANESTHESIA/SEDATION: Fentanyl 100 mcg IV; Versed 2.0 mg IV Moderate Sedation Time:  10 minutes The patient was continuously monitored during the procedure by the interventional radiology nurse under my direct supervision. COMPLICATIONS: None immediate. PROCEDURE: The procedure was explained to the patient. The risks and benefits of the procedure were discussed and the patient's questions were addressed. Informed consent was obtained from the patient. The patient was placed prone on CT table. Images of the pelvis were obtained. The right side of back was prepped and draped in sterile fashion. The skin and right posterior ilium were anesthetized with 1% lidocaine. 11 gauge bone needle was directed into the right ilium with CT guidance. Two aspirates and one core biopsy were obtained. Bandage placed over the puncture site. IMPRESSION: CT guided bone marrow aspiration and core biopsy. Electronically Signed   By: Markus Daft M.D.   On: 13-Jan-2018 09:08        Scheduled Meds: . busPIRone  10 mg Oral BID  . feeding supplement  1 Container Oral TID BM  . FLUoxetine  20 mg Oral Daily  . folic acid  1 mg Oral Daily  . gabapentin  100 mg Oral TID  . haloperidol  2 mg Oral QHS  . multivitamin with minerals  1 tablet Oral Daily  . thiamine  100 mg Oral Daily   Or  . thiamine  100 mg Intravenous Daily  . topiramate  50 mg Oral Daily  . traZODone  50 mg Oral QHS   Continuous Infusions: . cefTRIAXone (ROCEPHIN)  IV Stopped (Jan 13, 2018 2230)     LOS: 5 days    Time spent: 35  minutes    Irine Seal, MD [facility] Pager [phone]  If 7PM-7AM, please contact night-coverage [email or web address] Password [facility] 01/07/2018, 8:44 PM

## 2018-01-07 NOTE — Progress Notes (Signed)
Briana Sweeney really would like to go home.  I am unsure if she will be able to go home or if she has to go to behavioral health.  She had a bone marrow test yesterday.  I would not expect that we would have any results back until tomorrow.  There is no labs back yet today.  She is not bleeding.  Her platelet count yesterday was 17,000.  Her white cell count was 2.7.  Hemoglobin 10.5.  Really, the bone marrow biopsy will tell us what might be going on and how we might be able to help.  It would not surprise me if she has decreased megakaryocytes secondary to alcohol toxicity.  If she has abundant megakaryocytes, then we might have to make some other suggestions as to how we can help this.  Given her social situation, it might be very difficult for her to take an oral medication on a regular basis.  Pete Hibo Blasdell,MD  Proverbs 3:5-6

## 2018-01-07 NOTE — Progress Notes (Addendum)
Patient c/o constipation and requesting stool softener. Paged Dr. Tamala Julian. New order for senokot PRN ordered. Continue to monitor.

## 2018-01-08 ENCOUNTER — Encounter (HOSPITAL_COMMUNITY): Payer: Self-pay

## 2018-01-08 DIAGNOSIS — Z818 Family history of other mental and behavioral disorders: Secondary | ICD-10-CM | POA: Diagnosis not present

## 2018-01-08 DIAGNOSIS — D649 Anemia, unspecified: Secondary | ICD-10-CM | POA: Diagnosis not present

## 2018-01-08 DIAGNOSIS — Y909 Presence of alcohol in blood, level not specified: Secondary | ICD-10-CM | POA: Diagnosis not present

## 2018-01-08 DIAGNOSIS — K7031 Alcoholic cirrhosis of liver with ascites: Secondary | ICD-10-CM

## 2018-01-08 DIAGNOSIS — D61818 Other pancytopenia: Secondary | ICD-10-CM | POA: Diagnosis not present

## 2018-01-08 DIAGNOSIS — Z79899 Other long term (current) drug therapy: Secondary | ICD-10-CM

## 2018-01-08 DIAGNOSIS — F10239 Alcohol dependence with withdrawal, unspecified: Secondary | ICD-10-CM | POA: Diagnosis not present

## 2018-01-08 DIAGNOSIS — F419 Anxiety disorder, unspecified: Secondary | ICD-10-CM | POA: Diagnosis not present

## 2018-01-08 DIAGNOSIS — T50905A Adverse effect of unspecified drugs, medicaments and biological substances, initial encounter: Secondary | ICD-10-CM

## 2018-01-08 DIAGNOSIS — Z811 Family history of alcohol abuse and dependence: Secondary | ICD-10-CM

## 2018-01-08 DIAGNOSIS — F1014 Alcohol abuse with alcohol-induced mood disorder: Secondary | ICD-10-CM | POA: Diagnosis not present

## 2018-01-08 DIAGNOSIS — D6959 Other secondary thrombocytopenia: Secondary | ICD-10-CM

## 2018-01-08 DIAGNOSIS — E876 Hypokalemia: Secondary | ICD-10-CM

## 2018-01-08 LAB — CBC WITH DIFFERENTIAL/PLATELET
Basophils Absolute: 0 10*3/uL (ref 0.0–0.1)
Basophils Relative: 0 %
Eosinophils Absolute: 0.1 10*3/uL (ref 0.0–0.7)
Eosinophils Relative: 2 %
HCT: 31.8 % — ABNORMAL LOW (ref 36.0–46.0)
Hemoglobin: 10.1 g/dL — ABNORMAL LOW (ref 12.0–15.0)
Lymphocytes Relative: 19 %
Lymphs Abs: 0.7 10*3/uL (ref 0.7–4.0)
MCH: 29.6 pg (ref 26.0–34.0)
MCHC: 31.8 g/dL (ref 30.0–36.0)
MCV: 93.3 fL (ref 78.0–100.0)
Monocytes Absolute: 0.8 10*3/uL (ref 0.1–1.0)
Monocytes Relative: 22 %
Neutro Abs: 2.1 10*3/uL (ref 1.7–7.7)
Neutrophils Relative %: 57 %
Platelets: 21 10*3/uL — CL (ref 150–400)
RBC: 3.41 MIL/uL — ABNORMAL LOW (ref 3.87–5.11)
RDW: 20.2 % — ABNORMAL HIGH (ref 11.5–15.5)
WBC: 3.7 10*3/uL — ABNORMAL LOW (ref 4.0–10.5)

## 2018-01-08 LAB — BASIC METABOLIC PANEL
Anion gap: 8 (ref 5–15)
BUN: 8 mg/dL (ref 6–20)
CO2: 22 mmol/L (ref 22–32)
Calcium: 8.6 mg/dL — ABNORMAL LOW (ref 8.9–10.3)
Chloride: 106 mmol/L (ref 101–111)
Creatinine, Ser: 0.51 mg/dL (ref 0.44–1.00)
GFR calc Af Amer: >60 mL/min (ref >60)
GFR calc non Af Amer: >60 mL/min (ref >60)
Glucose, Bld: 113 mg/dL — ABNORMAL HIGH (ref 65–99)
Potassium: 4.2 mmol/L (ref 3.5–5.1)
Sodium: 136 mmol/L (ref 135–145)

## 2018-01-08 LAB — MAGNESIUM: Magnesium: 2 mg/dL (ref 1.7–2.4)

## 2018-01-08 MED ORDER — GABAPENTIN 100 MG PO CAPS: 200.0000 mg | ORAL_CAPSULE | Freq: Three times a day (TID) | ORAL | 0 refills | Status: DC

## 2018-01-08 MED ORDER — FLUOXETINE HCL 20 MG PO CAPS: 20.0000 mg | ORAL_CAPSULE | Freq: Every day | ORAL | 0 refills | Status: DC

## 2018-01-08 MED ORDER — FUROSEMIDE 20 MG PO TABS
20.0000 mg | ORAL_TABLET | Freq: Every day | ORAL | Status: DC
  Administered 2018-01-08: 20 mg via ORAL
  Filled 2018-01-08: qty 1

## 2018-01-08 MED ORDER — HYDROXYZINE HCL 25 MG PO TABS: 25.0000 mg | ORAL_TABLET | Freq: Three times a day (TID) | ORAL | 0 refills | Status: DC | PRN

## 2018-01-08 MED ORDER — HALOPERIDOL 2 MG PO TABS: 2.0000 mg | ORAL_TABLET | Freq: Every day | ORAL | 0 refills | Status: DC

## 2018-01-08 MED ORDER — FAMOTIDINE 20 MG PO TABS: 20.0000 mg | ORAL_TABLET | Freq: Two times a day (BID) | ORAL | 0 refills | Status: DC

## 2018-01-08 MED ORDER — BUSPIRONE HCL 10 MG PO TABS: 10.0000 mg | ORAL_TABLET | Freq: Two times a day (BID) | ORAL | 0 refills | Status: DC

## 2018-01-08 MED ORDER — FUROSEMIDE 20 MG PO TABS: 20.0000 mg | ORAL_TABLET | Freq: Every day | ORAL | 0 refills | Status: DC

## 2018-01-08 MED ORDER — SPIRONOLACTONE 25 MG PO TABS
50.0000 mg | ORAL_TABLET | Freq: Every day | ORAL | Status: DC
  Administered 2018-01-08: 50 mg via ORAL
  Filled 2018-01-08: qty 2

## 2018-01-08 MED ORDER — TRAZODONE HCL 50 MG PO TABS: 50.0000 mg | ORAL_TABLET | Freq: Every evening | ORAL | 0 refills | Status: DC | PRN

## 2018-01-08 MED ORDER — GABAPENTIN 100 MG PO CAPS
200.0000 mg | ORAL_CAPSULE | Freq: Three times a day (TID) | ORAL | Status: DC
  Administered 2018-01-08: 200 mg via ORAL
  Filled 2018-01-08: qty 2

## 2018-01-08 MED ORDER — TOPIRAMATE 50 MG PO TABS: 50.0000 mg | ORAL_TABLET | Freq: Every day | ORAL | 0 refills | Status: DC

## 2018-01-08 MED ORDER — LACTULOSE 10 GM/15ML PO SOLN: 20.0000 g | Freq: Every day | ORAL | 0 refills | Status: DC

## 2018-01-08 MED ORDER — FOLIC ACID 1 MG PO TABS: 1.0000 mg | ORAL_TABLET | Freq: Every day | ORAL | 0 refills | Status: DC

## 2018-01-08 MED ORDER — LACTULOSE 10 GM/15ML PO SOLN: 20.0000 g | Freq: Every day | ORAL | Status: DC

## 2018-01-08 MED ORDER — LACTULOSE 10 GM/15ML PO SOLN
20.0000 g | Freq: Every day | ORAL | Status: DC
  Administered 2018-01-08: 20 g via ORAL
  Filled 2018-01-08: qty 30

## 2018-01-08 MED ORDER — ADULT MULTIVITAMIN W/MINERALS CH: 1.0000 | ORAL_TABLET | Freq: Every day | ORAL | Status: DC

## 2018-01-08 MED ORDER — LACTULOSE 10 GM/15ML PO SOLN
30.0000 g | Freq: Once | ORAL | Status: AC
  Administered 2018-01-08: 30 g via ORAL
  Filled 2018-01-08: qty 45

## 2018-01-08 MED ORDER — SPIRONOLACTONE 50 MG PO TABS: 50.0000 mg | ORAL_TABLET | Freq: Every day | ORAL | 0 refills | Status: DC

## 2018-01-08 MED ORDER — TRAMADOL HCL 50 MG PO TABS: 50.0000 mg | ORAL_TABLET | Freq: Four times a day (QID) | ORAL | 0 refills | Status: DC | PRN

## 2018-01-08 MED ORDER — THIAMINE HCL 100 MG PO TABS: 100.0000 mg | ORAL_TABLET | Freq: Every day | ORAL | 0 refills | Status: DC

## 2018-01-08 NOTE — Discharge Planning (Signed)
Patient IV removed.  RN assessment and VS revealed stability for DC to home. Discharge papers given, explained and educated.  Informed of suggested FU appts.  Patient voiced understanding that she need to go to her FU appts or she will run out of needed medications. Scripts provided to get her through to her FU appts. Once ready wheeled to front and family transporting home via car.

## 2018-01-08 NOTE — Discharge Summary (Signed)
Physician Discharge Summary  Briana Sweeney OIZ:[id number] DOB: 1971/02/26 DOA: 01/02/2018  PCP: Scot Jun, FNP  Admit date: 01/02/2018 Discharge date: 01/08/2018  Time spent: 60 minutes  Recommendations for Outpatient Follow-up:  1. Follow-up with Scot Jun, FNP in 1 to 2 weeks.  On follow-up patient cirrhosis will need to be readdressed and patient may benefit from outpatient referral to gastroenterology.  Patient will need a basic metabolic profile checked, magnesium level checked and the CBC with differential check to follow-up on electrolytes, renal function, counts. 2. Follow-up with outpatient psychiatry in 1 week.  On follow-up patient's psychiatric illnesses will need to be followed up upon as well as her medication regimen. 3. Follow-up with Dr. Marin Olp, hematology in 3 weeks.  On follow-up will need a CBC with differential to follow-up on platelet count.  Bone marrow biopsy results will need to be followed up upon.   Discharge Diagnoses:  Principal Problem:   Alcohol abuse Active Problems:   Pancytopenia ([place])   Alcoholic cirrhosis of liver with ascites ([facility])   Alcohol abuse with alcohol-induced mood disorder ([facility])   Schizophrenia ([facility])   Thrombocytopenia ([place])   Suicidal ideation   Discharge Condition: Stable and improved  Diet recommendation: Heart healthy  Filed Weights   01/02/18 2326 01/05/18 2317 01/08/18 0438  Weight: 57.6 kg (126 lb 15.8 oz) 56.9 kg (125 lb 7.1 oz) 58 kg (127 lb 14.4 oz)    History of present illness:  Per Dr. Bland Span is a 47 y.o. female with history of alcohol abuse, liver cirrhosis with EGD in December 2018 showing esophageal varices, pancytopenia likely from alcoholism, schizophrenia had presented to the behavioral health with complaints of suicidal thoughts and anxiety.  Patient stated one of change neurotransmitters was released from jail and had come to home and was trying to threaten.  Patient stated that on  day of admission she had some rectal bleeding.  Denied any vomiting.  Patient stated she has not been taking many of her medications last few weeks as she ran out of it.  Patient admitted to drinking vodka.  Patient has had an EGD in December 2018 which showed esophageal varices.  ED Course:  In the ER behavioral health was consulted and was planned to be admitted beyond mild and basic labs done shows. Severe thrombocytopenia with unremarkable platelet counts.  Dr. Burney Gauze on-call hematologist was consulted.  At this time hematologist feel patient's platelets low due to alcoholism.  Patient was receiving platelet transfusion and admitted for further observation.  On exam patient denied any abdominal pain, had tremors.  Denied any suicidal thoughts.     Hospital Course:  1 history of alcohol abuse Patient patient was placed on the Ativan withdrawal protocol, folic acid, multivitamin and thiamine.  Alcohol cessation was stressed the patient daily throughout the hospitalization.  Patient did not have any DTs and no withdrawal symptoms by day of discharge.  Patient will be discharged home in stable and improved condition.   2.  Depression/history of schizophrenia/suicidal ideations Patient noted to have recently been discharged from behavioral health February 2019 however noncompliant with her medications and presented with suicidal ideation.  Patient seen in consultation by psychiatry who recommended starting patient on gabapentin, Prozac.  Psychiatry recommending inpatient psychiatric admission once patient was medically stable.  Patient is agreeable by psychiatry denied any further suicidal or homicidal ideation and patient was cleared psychiatry to be discharged home with outpatient follow-up.  Patient will be discharged on Prozac 20  mg daily, BuSpar 10 mg twice daily, Atarax 25 mg 3 times daily as needed for anxiety, Haldol 2 mg nightly for psychosis and trazodone 50 mg nightly for insomnia.   Patient also be discharged on gabapentin 200 mg 3 times daily for alcoholism.  Patient is to follow-up with psychiatry in the outpatient setting.   3.  Rectal bleeding Felt secondary to thrombocytopenia.  Hemoccult blood was positive.  No rectal bleeding since admission.  Platelet count currently stabled at 21,000 by day of discharge.  Hemoglobin stable at 10.1.   4.  Pancytopenia Likely secondary to alcoholic cirrhosis.  Patient denied any overt bleeding.  Patient seen in consultation by Dr. Marin Olp of hematology/oncology patient underwent a CT-guided bone marrow biopsy with results pending at time of discharge. Platelets stabilized at 21,000 by day of discharge. Outpatient follow up with hematology/oncology. Alcohol cessation was stressed to patient.   5.  Alcoholic cirrhosis/esophageal varices by last EGD Remained stable throughout the hospitalization. Patient with pancytopenia.  Patient with no overt bleeding.  Magnesium level at 1.7 which was repleted. Patient placed empirically on IV Rocephin and received a total of 5 days of IV antibiotics. Lasix and spironolactone were resumed on day of discharge. Outpatient follow up. May need outpatient refferal to GI.  6.  Hypokalemia Repleted.  7.  Headache Improved.  CT head negative.     Procedures:  CT head 01/03/2018  CT Guided bone marrow aspiration and core biopsy 01/06/2018 per Dr. Anselm Pancoast  Abdominal ultrasound 01/02/2018      Consultations:  Hematology/oncology Dr. Marin Olp 01/02/2018  Psychiatry: Dr. Darleene Cleaver 01/04/2018      Discharge Exam: Vitals:   01/08/18 0459 01/08/18 1453  BP: 113/76 105/79  Pulse: 94 88  Resp: 18 18  Temp: 98.8 F (37.1 C) 98.8 F (37.1 C)  SpO2: 95% 98%    General: NAD Cardiovascular: RRR Respiratory: CTAB  Discharge Instructions   Discharge Instructions    Diet - low sodium heart healthy   Complete by:  As directed    Increase activity slowly   Complete by:  As directed       Allergies as of 01/08/2018   No Known Allergies     Medication List    STOP taking these medications   pentoxifylline 400 MG CR tablet Commonly known as:  TRENTAL     TAKE these medications   busPIRone 10 MG tablet Commonly known as:  BUSPAR Take 1 tablet (10 mg total) by mouth 2 (two) times daily. What changed:    medication strength  how much to take  additional instructions   famotidine 20 MG tablet Commonly known as:  PEPCID Take 1 tablet (20 mg total) by mouth 2 (two) times daily. What changed:    when to take this  reasons to take this   FLUoxetine 20 MG capsule Commonly known as:  PROZAC Take 1 capsule (20 mg total) by mouth daily. For mood control   folic acid 1 MG tablet Commonly known as:  FOLVITE Take 1 tablet (1 mg total) by mouth daily. Start taking on:  01/09/2018   furosemide 20 MG tablet Commonly known as:  LASIX Take 1 tablet (20 mg total) by mouth daily.   gabapentin 100 MG capsule Commonly known as:  NEURONTIN Take 2 capsules (200 mg total) by mouth 3 (three) times daily.   haloperidol 2 MG tablet Commonly known as:  HALDOL Take 1 tablet (2 mg total) by mouth at bedtime.   hydrOXYzine 25 MG tablet  Commonly known as:  ATARAX/VISTARIL Take 1 tablet (25 mg total) by mouth 3 (three) times daily as needed for anxiety.   lactulose 10 GM/15ML solution Commonly known as:  CHRONULAC Take 30 mLs (20 g total) by mouth daily. Start taking on:  01/09/2018   multivitamin with minerals Tabs tablet Take 1 tablet by mouth daily. Start taking on:  01/09/2018   spironolactone 50 MG tablet Commonly known as:  ALDACTONE Take 1 tablet (50 mg total) by mouth daily.   thiamine 100 MG tablet Take 1 tablet (100 mg total) by mouth daily. Start taking on:  01/09/2018   topiramate 50 MG tablet Commonly known as:  TOPAMAX Take 1 tablet (50 mg total) by mouth daily.   traMADol 50 MG tablet Commonly known as:  ULTRAM Take 1 tablet (50 mg total) by mouth  every 6 (six) hours as needed for moderate pain.   traZODone 50 MG tablet Commonly known as:  DESYREL Take 1 tablet (50 mg total) by mouth at bedtime as needed for sleep. What changed:    medication strength  how much to take      No Known Allergies Follow-up Information    Scot Jun, FNP. Schedule an appointment as soon as possible for a visit in 2 week(s).   Specialty:  Family Medicine Why:  f/u in 1-2 weeks. Contact information: [address] [place] [address] [phone]        Psychiatry Follow up in 1 week(s).   Why:  f/u with your psychiatrist in 1 week.       Volanda Napoleon, MD. Schedule an appointment as soon as possible for a visit in 3 week(s).   Specialty:  Oncology Contact information: [place] [address] [phone]            The results of significant diagnostics from this hospitalization (including imaging, microbiology, ancillary and laboratory) are listed below for reference.    Significant Diagnostic Studies: Ct Head Wo Contrast  Result Date: 01/03/2018 CLINICAL DATA:  Headache, acute, severe, worst of life. Head pain and nose bleeds for 2 months. EXAM: CT HEAD WITHOUT CONTRAST TECHNIQUE: Contiguous axial images were obtained from the base of the skull through the vertex without intravenous contrast. COMPARISON:  None. FINDINGS: Brain: No evidence of acute infarction, hemorrhage, hydrocephalus, extra-axial collection or mass lesion/mass effect. Two slices are affected by streak artifact from an earing. Questionable small calcification in the left cerebellum, which would be nonspecific in isolation and incidental to the history. Mild cerebral volume loss. Vascular: No hyperdense vessel or unexpected calcification. Skull: Normal. Negative for fracture or focal lesion. Sinuses/Orbits: No acute finding. IMPRESSION: Negative head CT. Electronically Signed   By: Monte Fantasia M.D.   On: 01/03/2018 18:20    US Abdomen Complete  Result Date: 01/02/2018 CLINICAL DATA:  Initial evaluation for splenomegaly. History of hypertonicity with cirrhosis. EXAM: ABDOMEN ULTRASOUND COMPLETE COMPARISON:  Prior CT from 07/24/2017. FINDINGS: Gallbladder: 1.6 cm calculus present within the gallbladder lumen. Gallbladder wall measure within normal limits of 1.9 mm. No free pericholecystic fluid. No sonographic Murphy sign elicited on exam. Common bile duct: Diameter: 5.5 mm Liver: Liver demonstrates coarse heterogeneous and increased echotexture with nodular contour, consistent with cirrhosis. No focal intrahepatic lesion. Portal vein is patent on color Doppler imaging with normal direction of blood flow towards the liver. IVC: No abnormality visualized. Pancreas: Visualized portion unremarkable. Spleen: Size and appearance within normal limits, measuring 9.8 cm in  craniocaudad dimension. Right Kidney: Length: 11.4 cm. Echogenicity within normal limits. No mass or hydronephrosis visualized. Left Kidney: Length: 13.7 cm. Echogenicity within normal limits. No mass or hydronephrosis visualized. Abdominal aorta: No aneurysm visualized. Other findings: None. IMPRESSION: 1. Spleen size within normal limits by sonography. No evidence for splenomegaly. 2. Hepatic cirrhosis. 3. Cholelithiasis. No sonographic features to suggest acute cholecystitis. No biliary dilatation. Electronically Signed   By: Jeannine Boga M.D.   On: 01/02/2018 22:18   Ct Biopsy  Result Date: 01/06/2018 INDICATION: 47 year old with thrombocytopenia. EXAM: CT GUIDED BONE MARROW ASPIRATES AND BIOPSY Physician: Stephan Minister. Anselm Pancoast, MD MEDICATIONS: None. ANESTHESIA/SEDATION: Fentanyl 100 mcg IV; Versed 2.0 mg IV Moderate Sedation Time:  10 minutes The patient was continuously monitored during the procedure by the interventional radiology nurse under my direct supervision. COMPLICATIONS: None immediate. PROCEDURE: The procedure was explained to the patient. The risks  and benefits of the procedure were discussed and the patient's questions were addressed. Informed consent was obtained from the patient. The patient was placed prone on CT table. Images of the pelvis were obtained. The right side of back was prepped and draped in sterile fashion. The skin and right posterior ilium were anesthetized with 1% lidocaine. 11 gauge bone needle was directed into the right ilium with CT guidance. Two aspirates and one core biopsy were obtained. Bandage placed over the puncture site. IMPRESSION: CT guided bone marrow aspiration and core biopsy. Electronically Signed   By: Markus Daft M.D.   On: 01/06/2018 09:08   Ct Bone Marrow Biopsy & Aspiration  Result Date: 01/06/2018 INDICATION: 47 year old with thrombocytopenia. EXAM: CT GUIDED BONE MARROW ASPIRATES AND BIOPSY Physician: Stephan Minister. Anselm Pancoast, MD MEDICATIONS: None. ANESTHESIA/SEDATION: Fentanyl 100 mcg IV; Versed 2.0 mg IV Moderate Sedation Time:  10 minutes The patient was continuously monitored during the procedure by the interventional radiology nurse under my direct supervision. COMPLICATIONS: None immediate. PROCEDURE: The procedure was explained to the patient. The risks and benefits of the procedure were discussed and the patient's questions were addressed. Informed consent was obtained from the patient. The patient was placed prone on CT table. Images of the pelvis were obtained. The right side of back was prepped and draped in sterile fashion. The skin and right posterior ilium were anesthetized with 1% lidocaine. 11 gauge bone needle was directed into the right ilium with CT guidance. Two aspirates and one core biopsy were obtained. Bandage placed over the puncture site. IMPRESSION: CT guided bone marrow aspiration and core biopsy. Electronically Signed   By: Markus Daft M.D.   On: 01/06/2018 09:08    Microbiology: No results found for this or any previous visit (from the past 240 hour(s)).   Labs: Basic Metabolic  Panel: Recent Labs  Lab 01/02/18 1935 01/03/18 0417 01/04/18 0851 01/05/18 0504 01/07/18 0935 01/08/18 0423  NA 140 141 133*  --  137 136  K 3.3* 3.6 3.6  --  3.6 4.2  CL 107 108 105  --  108 106  CO2 18* 19* 18*  --  21* 22  GLUCOSE 141* 86 101*  --  153* 113*  BUN <5* <5* 7  --  11 8  CREATININE 0.39* 0.33* 0.56  --  0.56 0.51  CALCIUM 8.5* 7.9* 9.1  --  8.7* 8.6*  MG  --   --   --  1.8 1.7 2.0   Liver Function Tests: Recent Labs  Lab 01/02/18 1935 01/03/18 0417 01/04/18 0851 01/07/18 0935  AST 95* 87* 74*  43*  ALT 34 '30 28 20  '$ ALKPHOS 196* 170* 203* 163*  BILITOT 2.0* 1.6* 2.3* 0.9  PROT 8.0 7.1 8.5* 7.6  ALBUMIN 3.2* 2.8* 3.2* 2.8*   No results for input(s): LIPASE, AMYLASE in the last 168 hours. No results for input(s): AMMONIA in the last 168 hours. CBC: Recent Labs  Lab 01/03/18 0417  01/04/18 0851 01/05/18 0504 01/06/18 0434 01/07/18 0814 01/08/18 0423  WBC 2.3*  --  2.5*  --  2.7* 3.0* 3.7*  NEUTROABS 1.0*  --  0.9*  --  1.1* 1.5* 2.1  HGB 9.9*  --  11.0*  --  10.5* 10.3* 10.1*  HCT 30.6*  --  34.0*  --  33.0* 32.5* 31.8*  MCV 90.8  --  90.9  --  93.0 93.9 93.3  PLT 19*   < > 17* 18* 17* 17* 21*   < > = values in this interval not displayed.   Cardiac Enzymes: No results for input(s): CKTOTAL, CKMB, CKMBINDEX, TROPONINI in the last 168 hours. BNP: BNP (last 3 results) No results for input(s): BNP in the last 8760 hours.  ProBNP (last 3 results) No results for input(s): PROBNP in the last 8760 hours.  CBG: No results for input(s): GLUCAP in the last 168 hours.     Signed:  Irine Seal MD.  [facility] 01/08/2018, 4:43 PM

## 2018-01-08 NOTE — Consult Note (Signed)
[facility] Psych Consult Progress Note  01/08/2018 11:39 AM Sinda Leedom  MRN:  [id number] Subjective:  Ms. Schrodt was seen by the psychiatry consult service on 4/28 for SI in the setting of alcohol withdrawal. She reported feeling fearful for her life due to a friend leaving her a threatening note at her home. Home medications were continued (Prozac 20 mg daily, Haldol 2 mg qhs and Trazodone 50 mg qhs) and Gabapentin 100 mg TID and Atarax 25 mg TID PRN were started. Buspar 7.5 mg BID was increased to 10 mg BID yesterday. She was discharged from [facility] in February but reports poor compliance with medications secondary to running out of them. Per primary team progress note on 4/30, patient reported wanted to go home and feeling better.   On interview, Ms. Gaulin denies SI today. She previously endorsed SI due to having "a lot of things going through her head due to withdrawing from alcohol." She denies HI or AVH. She denies access to weapons or guns at home. She reports some anxiety associated with being in the hospital. She denies problems with sleep or appetite. She denies current withdrawal symptoms.   Principal Problem: Alcohol abuse Diagnosis:   Patient Active Problem List   Diagnosis Date Noted  . Suicidal ideation [R45.851]   . Thrombocytopenia ([place]) [D69.6] 01/02/2018  . GERD (gastroesophageal reflux disease) [K21.9] 11/01/2017  . Trichimoniasis [A59.9] 10/30/2017  . Alcohol dependence ([place]) [F10.20] 10/29/2017  . MDD (major depressive disorder), severe ([place]) [F32.2] 10/28/2017  . Acute hyperactive alcohol withdrawal delirium ([place]) [F10.231] 10/09/2017  . Schizophrenia ([place]) [F20.9] 10/09/2017  . Alcohol abuse with alcohol-induced mood disorder ([place]) [F10.14]   . Alcohol withdrawal ([place]) [F10.239] 09/11/2017  . 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 Hematemesis [K92.0] 09/01/2017  . Ascites due to alcoholic cirrhosis ([place]) [N00.37]   . Decompensated hepatic cirrhosis ([place])  [K72.90] 07/23/2017  . Alcohol abuse [F10.10] 07/23/2017  . Hepatitis C antibody positive in blood [R76.8] 07/23/2017  . Hypokalemia [E87.6] 07/23/2017  . Jaundice [R17] 07/23/2017  . Coagulopathy ([place]) [D68.9] 07/23/2017  . Hypomagnesemia [E83.42] 07/23/2017  . Pancytopenia ([place]) [C48.889] 07/23/2017  . Alcoholic cirrhosis of liver with ascites ([place]) [K70.31] 07/23/2017   Total Time spent with patient: 15 minutes  Past Psychiatric History: Anxiety, depression, schizophrenia and bipolar disorder.   Past Medical History:  Past Medical History:  Diagnosis Date  . Anxiety   . Bipolar affective disorder ([place])    With anxiety features  . Cirrhosis of liver ([place])    Due to alcohol and hepatitis C  . Depression   . GERD (gastroesophageal reflux disease)   . Hepatitis C 2018   hepatitis c and alcohol related hepatitis  . History of blood transfusion    "blood doesn't clot; I fell down and had to have a transfusion"  . History of kidney stones   . Migraine    "when I get really stressed" (09/01/2017)  . Schizophrenia ([place])   . Seizures ([place])    "when I run out of my RX; lots recently" (09/01/2017)    Past Surgical History:  Procedure Laterality Date  . ESOPHAGOGASTRODUODENOSCOPY N/A 09/03/2017   Procedure: ESOPHAGOGASTRODUODENOSCOPY (EGD);  Surgeon: Doran Stabler, MD;  Location: [place];  Service: Gastroenterology;  Laterality: N/A;  . FINGER FRACTURE SURGERY Left    "shattered my pinky"  . FRACTURE SURGERY    . IR PARACENTESIS  07/23/2017  . IR PARACENTESIS  07/2017   "did it twice in the same  week" (09/01/2017)  . SHOULDER OPEN ROTATOR CUFF REPAIR Right   . TUBAL LIGATION    . VAGINAL HYSTERECTOMY     Family History:  Family History  Problem Relation Age of Onset  . Lung cancer Mother 41  . Alcohol abuse Mother   . Throat cancer Father 31   Family Psychiatric  History: Mother-schizophrenia and alcohol abuse and daughter-bipolar disorder and schizophrenia.    Social History:  Social History   Substance and Sexual Activity  Alcohol Use Yes  . Alcohol/week: 16.8 oz  . Types: 28 Cans of beer per week   Comment: weekly "I have cut back"     Social History   Substance and Sexual Activity  Drug Use No    Social History   Socioeconomic History  . Marital status: Divorced    Spouse name: Not on file  . Number of children: Not on file  . Years of education: Not on file  . Highest education level: Not on file  Occupational History  . Occupation: applying for disability  Social Needs  . Financial resource strain: Not on file  . Food insecurity:    Worry: Not on file    Inability: Not on file  . Transportation needs:    Medical: Not on file    Non-medical: Not on file  Tobacco Use  . Smoking status: Never Smoker  . Smokeless tobacco: Never Used  Substance and Sexual Activity  . Alcohol use: Yes    Alcohol/week: 16.8 oz    Types: 28 Cans of beer per week    Comment: weekly "I have cut back"  . Drug use: No  . Sexual activity: Not Currently  Lifestyle  . Physical activity:    Days per week: Not on file    Minutes per session: Not on file  . Stress: Not on file  Relationships  . Social connections:    Talks on phone: Not on file    Gets together: Not on file    Attends religious service: Not on file    Active member of club or organization: Not on file    Attends meetings of clubs or organizations: Not on file    Relationship status: Not on file  Other Topics Concern  . Not on file  Social History Narrative   She moved with a boyfriend to [place] and was followed at [facility].  He died of a massive heart attack in 8/18, per her report, and so she moved back to [place] and is living with a friend.    Sleep: Good  Appetite:  Good  Current Medications: Current Facility-Administered Medications  Medication Dose Route Frequency Provider Last Rate Last Dose  . busPIRone (BUSPAR) tablet 10 mg  10 mg Oral BID  Eugenie Filler, MD   10 mg at 01/08/18 1021  . feeding supplement (BOOST / RESOURCE BREEZE) liquid 1 Container  1 Container Oral TID BM Hosie Poisson, MD   1 Container at 01/08/18 1020  . FLUoxetine (PROZAC) capsule 20 mg  20 mg Oral Daily Hosie Poisson, MD   20 mg at 01/08/18 1033  . folic acid (FOLVITE) tablet 1 mg  1 mg Oral Daily Rise Patience, MD   1 mg at 01/08/18 1021  . furosemide (LASIX) tablet 20 mg  20 mg Oral Daily Eugenie Filler, MD   20 mg at 01/08/18 1021  . gabapentin (NEURONTIN) capsule 100 mg  100 mg Oral TID Corena Pilgrim, MD   100  mg at 01/08/18 1021  . haloperidol (HALDOL) 2 MG/ML solution 2 mg  2 mg Oral QHS Rise Patience, MD   2 mg at 01/07/18 2148  . hydrOXYzine (ATARAX/VISTARIL) tablet 10 mg  10 mg Oral TID PRN Hosie Poisson, MD   10 mg at 01/08/18 0434  . lactulose (CHRONULAC) 10 GM/15ML solution 20 g  20 g Oral Daily Eugenie Filler, MD   20 g at 01/08/18 1020  . multivitamin with minerals tablet 1 tablet  1 tablet Oral Daily Rise Patience, MD   1 tablet at 01/08/18 1021  . ondansetron (ZOFRAN) tablet 4 mg  4 mg Oral Q6H PRN Rise Patience, MD       Or  . ondansetron [facility]) injection 4 mg  4 mg Intravenous Q6H PRN Rise Patience, MD   4 mg at 01/07/18 0803  . senna-docusate (Senokot-S) tablet 1 tablet  1 tablet Oral Daily PRN Norval Morton, MD   1 tablet at 01/08/18 0434  . spironolactone (ALDACTONE) tablet 50 mg  50 mg Oral Daily Eugenie Filler, MD   50 mg at 01/08/18 1021  . thiamine (VITAMIN B-1) tablet 100 mg  100 mg Oral Daily Rise Patience, MD   100 mg at 01/08/18 1021   Or  . thiamine (B-1) injection 100 mg  100 mg Intravenous Daily Rise Patience, MD      . topiramate (TOPAMAX) tablet 50 mg  50 mg Oral Daily Akintayo, Mojeed, MD   50 mg at 01/08/18 1021  . traMADol (ULTRAM) tablet 50 mg  50 mg Oral Q6H PRN Hosie Poisson, MD   50 mg at 01/08/18 1033  . traZODone (DESYREL) tablet 50 mg  50 mg  Oral QHS Hosie Poisson, MD   50 mg at 01/07/18 2149    Lab Results:  Results for orders placed or performed during the hospital encounter of 01/02/18 (from the past 48 hour(s))  CBC with Differential/Platelet     Status: Abnormal   Collection Time: 01/07/18  8:14 AM  Result Value Ref Range   WBC 3.0 (L) 4.0 - 10.5 K/uL   RBC 3.46 (L) 3.87 - 5.11 MIL/uL   Hemoglobin 10.3 (L) 12.0 - 15.0 g/dL   HCT 32.5 (L) 36.0 - 46.0 %   MCV 93.9 78.0 - 100.0 fL   MCH 29.8 26.0 - 34.0 pg   MCHC 31.7 30.0 - 36.0 g/dL   RDW 20.0 (H) 11.5 - 15.5 %   Platelets 17 (LL) 150 - 400 K/uL    Comment: CRITICAL VALUE NOTED.  VALUE IS CONSISTENT WITH PREVIOUSLY REPORTED AND CALLED VALUE.   Neutrophils Relative % 49 %   Neutro Abs 1.5 (L) 1.7 - 7.7 K/uL   Lymphocytes Relative 33 %   Lymphs Abs 1.0 0.7 - 4.0 K/uL   Monocytes Relative 16 %   Monocytes Absolute 0.5 0.1 - 1.0 K/uL   Eosinophils Relative 2 %   Eosinophils Absolute 0.1 0.0 - 0.7 K/uL   Basophils Relative 0 %   Basophils Absolute 0.0 0.0 - 0.1 K/uL    Comment: Performed at [facility], [place] [address], [place], [place] [address]  Basic metabolic panel     Status: Abnormal   Collection Time: 01/07/18  9:35 AM  Result Value Ref Range   Sodium 137 135 - 145 mmol/L   Potassium 3.6 3.5 - 5.1 mmol/L   Chloride 108 101 - 111 mmol/L   CO2 21 (L) 22 -  32 mmol/L   Glucose, Bld 153 (H) 65 - 99 mg/dL   BUN 11 6 - 20 mg/dL   Creatinine, Ser 0.56 0.44 - 1.00 mg/dL   Calcium 8.7 (L) 8.9 - 10.3 mg/dL   GFR calc non Af Amer >60 >60 mL/min   GFR calc Af Amer >60 >60 mL/min    Comment: (NOTE) The eGFR has been calculated using the CKD EPI equation. This calculation has not been validated in all clinical situations. eGFR's persistently <60 mL/min signify possible Chronic Kidney Disease.    Anion gap 8 5 - 15    Comment: Performed at [facility], [place] [address], [place], [place] [address]  Hepatic function panel      Status: Abnormal   Collection Time: 01/07/18  9:35 AM  Result Value Ref Range   Total Protein 7.6 6.5 - 8.1 g/dL   Albumin 2.8 (L) 3.5 - 5.0 g/dL   AST 43 (H) 15 - 41 U/L   ALT 20 14 - 54 U/L   Alkaline Phosphatase 163 (H) 38 - 126 U/L   Total Bilirubin 0.9 0.3 - 1.2 mg/dL   Bilirubin, Direct 0.4 0.1 - 0.5 mg/dL   Indirect Bilirubin 0.5 0.3 - 0.9 mg/dL    Comment: Performed at [facility], [place] [address], [place], [place] [address]  Magnesium     Status: None   Collection Time: 01/07/18  9:35 AM  Result Value Ref Range   Magnesium 1.7 1.7 - 2.4 mg/dL    Comment: Performed at [facility], [place] [address], [place], [place] [address]  Protime-INR     Status: None   Collection Time: 01/07/18  9:35 AM  Result Value Ref Range   Prothrombin Time 14.8 11.4 - 15.2 seconds   INR 1.17     Comment: Performed at [facility], [place] [address], [place], [place] [address]  CBC with Differential/Platelet     Status: Abnormal   Collection Time: 01/08/18  4:23 AM  Result Value Ref Range   WBC 3.7 (L) 4.0 - 10.5 K/uL   RBC 3.41 (L) 3.87 - 5.11 MIL/uL   Hemoglobin 10.1 (L) 12.0 - 15.0 g/dL   HCT 31.8 (L) 36.0 - 46.0 %   MCV 93.3 78.0 - 100.0 fL   MCH 29.6 26.0 - 34.0 pg   MCHC 31.8 30.0 - 36.0 g/dL   RDW 20.2 (H) 11.5 - 15.5 %   Platelets 21 (LL) 150 - 400 K/uL    Comment: CRITICAL VALUE NOTED.  VALUE IS CONSISTENT WITH PREVIOUSLY REPORTED AND CALLED VALUE.   Neutrophils Relative % 57 %   Neutro Abs 2.1 1.7 - 7.7 K/uL   Lymphocytes Relative 19 %   Lymphs Abs 0.7 0.7 - 4.0 K/uL   Monocytes Relative 22 %   Monocytes Absolute 0.8 0.1 - 1.0 K/uL   Eosinophils Relative 2 %   Eosinophils Absolute 0.1 0.0 - 0.7 K/uL   Basophils Relative 0 %   Basophils Absolute 0.0 0.0 - 0.1 K/uL    Comment: Performed at [facility], [place] [address] [place], [place] [address]  Magnesium     Status: None   Collection Time: 01/08/18  4:23  AM  Result Value Ref Range   Magnesium 2.0 1.7 - 2.4 mg/dL    Comment: Performed at [facility], [place] [address] [place], [place] [address]  Basic metabolic panel     Status: Abnormal   Collection Time:  01/08/18  4:23 AM  Result Value Ref Range   Sodium 136 135 - 145 mmol/L   Potassium 4.2 3.5 - 5.1 mmol/L   Chloride 106 101 - 111 mmol/L   CO2 22 22 - 32 mmol/L   Glucose, Bld 113 (H) 65 - 99 mg/dL   BUN 8 6 - 20 mg/dL   Creatinine, Ser 0.51 0.44 - 1.00 mg/dL   Calcium 8.6 (L) 8.9 - 10.3 mg/dL   GFR calc non Af Amer >60 >60 mL/min   GFR calc Af Amer >60 >60 mL/min    Comment: (NOTE) The eGFR has been calculated using the CKD EPI equation. This calculation has not been validated in all clinical situations. eGFR's persistently <60 mL/min signify possible Chronic Kidney Disease.    Anion gap 8 5 - 15    Comment: Performed at [facility], [place] [address] [place], [place] [address]    Blood Alcohol level:  Lab Results  Component Value Date   ETH 397 [facility]) 01/02/2018   ETH 466 (HH) 01/01/2018    Musculoskeletal: Strength & Muscle Tone: within normal limits Gait & Station: UTA since patient was lying in bed. Patient leans: N/A  Psychiatric Specialty Exam: Physical Exam  Nursing note and vitals reviewed. Constitutional: She is oriented to person, place, and time. She appears well-developed and well-nourished.  HENT:  Head: Normocephalic and atraumatic.  Neck: Normal range of motion.  Respiratory: Effort normal.  Musculoskeletal: Normal range of motion.  Neurological: She is alert and oriented to person, place, and time.  Skin: No rash noted.  Psychiatric: She has a normal mood and affect. Her speech is normal and behavior is normal. Judgment and thought content normal. Cognition and memory are normal.    ROS  Blood pressure 113/76, pulse 94, temperature 98.8 F (37.1 C), temperature source Oral, resp. rate 18, height 4' 11" (1.499  m), weight 58 kg (127 lb 14.4 oz), SpO2 95 %.Body mass index is 25.83 kg/m.  General Appearance: Fairly Groomed, middle aged, Caucasian female, wearing a hospital gown and lying in bed. NAD.   Eye Contact:  Good  Speech:  Clear and Coherent and Normal Rate  Volume:  Normal  Mood:  Euphoric and Euthymic  Affect:  Appropriate and Congruent  Thought Process:  Goal Directed, Linear and Descriptions of Associations: Intact  Orientation:  Full (Time, Place, and Person)  Thought Content:  Logical  Suicidal Thoughts:  No  Homicidal Thoughts:  No  Memory:  Immediate;   Good Recent;   Good Remote;   Good  Judgement:  Good  Insight:  Good  Psychomotor Activity:  Normal  Concentration:  Concentration: Good and Attention Span: Good  Recall:  Good  Fund of Knowledge:  Good  Language:  Good  Akathisia:  No  Handed:  Right  AIMS (if indicated):   N/A  Assets:  Communication Skills Housing  ADL's:  Intact  Cognition:  WNL  Sleep:   Okay   Assessment:  Suzette Flagler is a 47 y.o. female who was admitted with alcohol withdrawal and pancytopenia with severe thrombocytopenia. She initially presented to [facility] as a walk-in for SI and paranoia and was sent to the hospital for medical clearance. She reports an improvement in her mood today. She denies SI and reports endorsing SI in the setting of alcohol withdrawal. She denies HI or AVH. She does not warrant inpatient psychiatric hospitalization at this time. She will follow up with her outpatient mental health provider for further  medication management. She reports having the resources to seek substance abuse treatment.    Treatment Plan Summary: -Continue Prozac 20 mg daily for depression/anxiety, Buspar 10 mg BID for mood augmentation/anxiety, Atarax 25 mg TID PRN for anxiety, Haldol 2 mg qhs for psychosis and Trazodone 50 mg qhs for insomnia. -Increase Gabapentin 100 mg TID to 200 mg TID for alcoholism.  -Patient is psychiatrically cleared. Psychiatry  will sign off on patient at this time. Please consult psychiatry again as needed.   Faythe Dingwall, DO 01/08/2018, 11:39 AM

## 2018-01-13 ENCOUNTER — Emergency Department (HOSPITAL_COMMUNITY): Payer: Self-pay

## 2018-01-13 ENCOUNTER — Encounter (HOSPITAL_COMMUNITY): Payer: Self-pay

## 2018-01-13 ENCOUNTER — Inpatient Hospital Stay (HOSPITAL_COMMUNITY)
Admission: EM | Admit: 2018-01-13 | Discharge: 2018-01-17 | DRG: 897 | Disposition: A | Discharge status: Home / Self Care | Payer: Self-pay | Attending: Internal Medicine | Admitting: Internal Medicine

## 2018-01-13 ENCOUNTER — Other Ambulatory Visit: Payer: Self-pay

## 2018-01-13 DIAGNOSIS — W19XXXA Unspecified fall, initial encounter: Secondary | ICD-10-CM

## 2018-01-13 DIAGNOSIS — F10920 Alcohol use, unspecified with intoxication, uncomplicated: Secondary | ICD-10-CM

## 2018-01-13 DIAGNOSIS — R768 Other specified abnormal immunological findings in serum: Secondary | ICD-10-CM | POA: Diagnosis present

## 2018-01-13 DIAGNOSIS — Z79899 Other long term (current) drug therapy: Secondary | ICD-10-CM

## 2018-01-13 DIAGNOSIS — F10939 Alcohol use, unspecified with withdrawal, unspecified: Secondary | ICD-10-CM

## 2018-01-13 DIAGNOSIS — F322 Major depressive disorder, single episode, severe without psychotic features: Secondary | ICD-10-CM | POA: Diagnosis present

## 2018-01-13 DIAGNOSIS — E162 Hypoglycemia, unspecified: Secondary | ICD-10-CM | POA: Diagnosis present

## 2018-01-13 DIAGNOSIS — E46 Unspecified protein-calorie malnutrition: Secondary | ICD-10-CM | POA: Diagnosis present

## 2018-01-13 DIAGNOSIS — R000 Tachycardia, unspecified: Secondary | ICD-10-CM | POA: Diagnosis present

## 2018-01-13 DIAGNOSIS — D696 Thrombocytopenia, unspecified: Secondary | ICD-10-CM | POA: Diagnosis present

## 2018-01-13 DIAGNOSIS — Z90710 Acquired absence of both cervix and uterus: Secondary | ICD-10-CM

## 2018-01-13 DIAGNOSIS — F10231 Alcohol dependence with withdrawal delirium: Secondary | ICD-10-CM

## 2018-01-13 DIAGNOSIS — R270 Ataxia, unspecified: Secondary | ICD-10-CM | POA: Diagnosis present

## 2018-01-13 DIAGNOSIS — F419 Anxiety disorder, unspecified: Secondary | ICD-10-CM | POA: Diagnosis present

## 2018-01-13 DIAGNOSIS — F10239 Alcohol dependence with withdrawal, unspecified: Secondary | ICD-10-CM | POA: Diagnosis present

## 2018-01-13 DIAGNOSIS — Y92009 Unspecified place in unspecified non-institutional (private) residence as the place of occurrence of the external cause: Secondary | ICD-10-CM

## 2018-01-13 DIAGNOSIS — K7030 Alcoholic cirrhosis of liver without ascites: Secondary | ICD-10-CM

## 2018-01-13 DIAGNOSIS — K219 Gastro-esophageal reflux disease without esophagitis: Secondary | ICD-10-CM | POA: Diagnosis present

## 2018-01-13 DIAGNOSIS — K7031 Alcoholic cirrhosis of liver with ascites: Secondary | ICD-10-CM | POA: Diagnosis present

## 2018-01-13 DIAGNOSIS — F209 Schizophrenia, unspecified: Secondary | ICD-10-CM | POA: Diagnosis present

## 2018-01-13 DIAGNOSIS — Y908 Blood alcohol level of 240 mg/100 ml or more: Secondary | ICD-10-CM | POA: Diagnosis present

## 2018-01-13 DIAGNOSIS — Z801 Family history of malignant neoplasm of trachea, bronchus and lung: Secondary | ICD-10-CM

## 2018-01-13 DIAGNOSIS — M549 Dorsalgia, unspecified: Secondary | ICD-10-CM | POA: Diagnosis present

## 2018-01-13 DIAGNOSIS — R569 Unspecified convulsions: Secondary | ICD-10-CM | POA: Diagnosis present

## 2018-01-13 DIAGNOSIS — F319 Bipolar disorder, unspecified: Secondary | ICD-10-CM | POA: Diagnosis present

## 2018-01-13 DIAGNOSIS — B1920 Unspecified viral hepatitis C without hepatic coma: Secondary | ICD-10-CM | POA: Diagnosis present

## 2018-01-13 DIAGNOSIS — R296 Repeated falls: Secondary | ICD-10-CM | POA: Diagnosis present

## 2018-01-13 DIAGNOSIS — F10220 Alcohol dependence with intoxication, uncomplicated: Principal | ICD-10-CM | POA: Diagnosis present

## 2018-01-13 DIAGNOSIS — D61818 Other pancytopenia: Secondary | ICD-10-CM | POA: Diagnosis present

## 2018-01-13 DIAGNOSIS — Z808 Family history of malignant neoplasm of other organs or systems: Secondary | ICD-10-CM

## 2018-01-13 LAB — URINALYSIS, ROUTINE W REFLEX MICROSCOPIC
Bilirubin Urine: NEGATIVE
Glucose, UA: NEGATIVE mg/dL
Hgb urine dipstick: NEGATIVE
Ketones, ur: NEGATIVE mg/dL
Leukocytes, UA: NEGATIVE
Nitrite: NEGATIVE
Protein, ur: NEGATIVE mg/dL
Specific Gravity, Urine: 1.002 — ABNORMAL LOW (ref 1.005–1.030)
pH: 7 (ref 5.0–8.0)

## 2018-01-13 LAB — TYPE AND SCREEN
ABO/RH(D): O POS
Antibody Screen: NEGATIVE

## 2018-01-13 LAB — PREGNANCY, URINE: Preg Test, Ur: NEGATIVE

## 2018-01-13 LAB — BASIC METABOLIC PANEL
Anion gap: 13 (ref 5–15)
BUN: 5 mg/dL — ABNORMAL LOW (ref 6–20)
CO2: 24 mmol/L (ref 22–32)
Calcium: 8.1 mg/dL — ABNORMAL LOW (ref 8.9–10.3)
Chloride: 106 mmol/L (ref 101–111)
Creatinine, Ser: 0.52 mg/dL (ref 0.44–1.00)
GFR calc Af Amer: >60 mL/min (ref >60)
GFR calc non Af Amer: >60 mL/min (ref >60)
Glucose, Bld: 112 mg/dL — ABNORMAL HIGH (ref 65–99)
Potassium: 3.7 mmol/L (ref 3.5–5.1)
Sodium: 143 mmol/L (ref 135–145)

## 2018-01-13 LAB — POC URINE PREG, ED: Preg Test, Ur: NEGATIVE

## 2018-01-13 LAB — CBG MONITORING, ED
Glucose-Capillary: 110 mg/dL — ABNORMAL HIGH (ref 65–99)
Glucose-Capillary: 20 mg/dL — CL (ref 65–99)
Glucose-Capillary: 104 mg/dL — ABNORMAL HIGH (ref 65–99)

## 2018-01-13 LAB — CBC
HCT: 35.6 % — ABNORMAL LOW (ref 36.0–46.0)
Hemoglobin: 11.5 g/dL — ABNORMAL LOW (ref 12.0–15.0)
MCH: 29.8 pg (ref 26.0–34.0)
MCHC: 32.3 g/dL (ref 30.0–36.0)
MCV: 92.2 fL (ref 78.0–100.0)
Platelets: 45 10*3/uL — ABNORMAL LOW (ref 150–400)
RBC: 3.86 MIL/uL — ABNORMAL LOW (ref 3.87–5.11)
RDW: 20.2 % — ABNORMAL HIGH (ref 11.5–15.5)
WBC: 3.6 10*3/uL — ABNORMAL LOW (ref 4.0–10.5)

## 2018-01-13 LAB — HEPATIC FUNCTION PANEL
ALT: 24 U/L (ref 14–54)
AST: 65 U/L — ABNORMAL HIGH (ref 15–41)
Albumin: 3.4 g/dL — ABNORMAL LOW (ref 3.5–5.0)
Alkaline Phosphatase: 160 U/L — ABNORMAL HIGH (ref 38–126)
Bilirubin, Direct: 0.4 mg/dL (ref 0.1–0.5)
Indirect Bilirubin: 0.7 mg/dL (ref 0.3–0.9)
Total Bilirubin: 1.1 mg/dL (ref 0.3–1.2)
Total Protein: 8.3 g/dL — ABNORMAL HIGH (ref 6.5–8.1)

## 2018-01-13 LAB — AMMONIA: Ammonia: 32 umol/L (ref 9–35)

## 2018-01-13 LAB — ETHANOL: Alcohol, Ethyl (B): 363 mg/dL (ref <10)

## 2018-01-13 MED ORDER — FUROSEMIDE 20 MG PO TABS
20.0000 mg | ORAL_TABLET | Freq: Every day | ORAL | Status: DC
  Administered 2018-01-14 – 2018-01-17 (×4): 20 mg via ORAL
  Filled 2018-01-13 (×4): qty 1

## 2018-01-13 MED ORDER — SPIRONOLACTONE 25 MG PO TABS
50.0000 mg | ORAL_TABLET | Freq: Every day | ORAL | Status: DC
  Administered 2018-01-14 – 2018-01-17 (×4): 50 mg via ORAL
  Filled 2018-01-13: qty 1
  Filled 2018-01-13 (×5): qty 2

## 2018-01-13 MED ORDER — VITAMIN B-1 100 MG PO TABS
100.0000 mg | ORAL_TABLET | Freq: Every day | ORAL | Status: DC
  Administered 2018-01-14 – 2018-01-17 (×4): 100 mg via ORAL
  Filled 2018-01-13 (×4): qty 1

## 2018-01-13 MED ORDER — LORAZEPAM 2 MG/ML IJ SOLN
1.0000 mg | Freq: Once | INTRAMUSCULAR | Status: AC
Start: 2018-01-13 — End: 2018-01-13
  Administered 2018-01-13: 1 mg via INTRAVENOUS
  Filled 2018-01-13: qty 1

## 2018-01-13 MED ORDER — THIAMINE HCL 100 MG/ML IJ SOLN
100.0000 mg | Freq: Every day | INTRAMUSCULAR | Status: DC
  Administered 2018-01-13: 100 mg via INTRAVENOUS
  Filled 2018-01-13: qty 2

## 2018-01-13 MED ORDER — FOLIC ACID 1 MG PO TABS: 1.0000 mg | ORAL_TABLET | Freq: Every day | ORAL | Status: DC

## 2018-01-13 MED ORDER — FAMOTIDINE 20 MG PO TABS
20.0000 mg | ORAL_TABLET | Freq: Two times a day (BID) | ORAL | Status: DC
  Administered 2018-01-13 – 2018-01-17 (×8): 20 mg via ORAL
  Filled 2018-01-13 (×8): qty 1

## 2018-01-13 MED ORDER — CHLORDIAZEPOXIDE HCL 10 MG PO CAPS
10.0000 mg | ORAL_CAPSULE | Freq: Three times a day (TID) | ORAL | Status: AC
  Administered 2018-01-14: 10 mg via ORAL
  Filled 2018-01-13: qty 1

## 2018-01-13 MED ORDER — HALOPERIDOL LACTATE 2 MG/ML PO CONC
2.0000 mg | Freq: Every day | ORAL | Status: DC
  Administered 2018-01-13 – 2018-01-16 (×4): 2 mg via ORAL
  Filled 2018-01-13 (×4): qty 1

## 2018-01-13 MED ORDER — ONDANSETRON HCL 4 MG/2ML IJ SOLN: 4.0000 mg | Freq: Four times a day (QID) | INTRAMUSCULAR | Status: DC | PRN

## 2018-01-13 MED ORDER — THIAMINE HCL 100 MG/ML IJ SOLN
Freq: Once | INTRAVENOUS | Status: AC
  Administered 2018-01-13: 19:00:00 via INTRAVENOUS
  Filled 2018-01-13: qty 1000

## 2018-01-13 MED ORDER — LORAZEPAM 1 MG PO TABS: 0.0000 mg | ORAL_TABLET | Freq: Four times a day (QID) | ORAL | Status: DC

## 2018-01-13 MED ORDER — TRAZODONE HCL 50 MG PO TABS
50.0000 mg | ORAL_TABLET | Freq: Every evening | ORAL | Status: DC | PRN
  Administered 2018-01-16: 50 mg via ORAL
  Filled 2018-01-13: qty 1

## 2018-01-13 MED ORDER — BUSPIRONE HCL 5 MG PO TABS
10.0000 mg | ORAL_TABLET | Freq: Two times a day (BID) | ORAL | Status: DC
  Administered 2018-01-13 – 2018-01-17 (×8): 10 mg via ORAL
  Filled 2018-01-13 (×8): qty 2

## 2018-01-13 MED ORDER — LACTULOSE 10 GM/15ML PO SOLN
20.0000 g | Freq: Every day | ORAL | Status: DC
  Administered 2018-01-14 – 2018-01-17 (×4): 20 g via ORAL
  Filled 2018-01-13 (×4): qty 30

## 2018-01-13 MED ORDER — HYDROXYZINE HCL 25 MG PO TABS
25.0000 mg | ORAL_TABLET | Freq: Three times a day (TID) | ORAL | Status: DC | PRN
  Administered 2018-01-14 – 2018-01-17 (×6): 25 mg via ORAL
  Filled 2018-01-13 (×6): qty 1

## 2018-01-13 MED ORDER — TOPIRAMATE 25 MG PO TABS
50.0000 mg | ORAL_TABLET | Freq: Every day | ORAL | Status: DC
  Administered 2018-01-14 – 2018-01-17 (×4): 50 mg via ORAL
  Filled 2018-01-13 (×4): qty 2

## 2018-01-13 MED ORDER — CHLORDIAZEPOXIDE HCL 10 MG PO CAPS
10.0000 mg | ORAL_CAPSULE | Freq: Every day | ORAL | Status: DC
  Administered 2018-01-15 – 2018-01-17 (×3): 10 mg via ORAL
  Filled 2018-01-13 (×3): qty 1

## 2018-01-13 MED ORDER — FLUOXETINE HCL 20 MG PO CAPS
20.0000 mg | ORAL_CAPSULE | Freq: Every day | ORAL | Status: DC
  Administered 2018-01-14 – 2018-01-17 (×4): 20 mg via ORAL
  Filled 2018-01-13 (×4): qty 1

## 2018-01-13 MED ORDER — CHLORDIAZEPOXIDE HCL 25 MG PO CAPS: 25.0000 mg | ORAL_CAPSULE | Freq: Once | ORAL | Status: DC

## 2018-01-13 MED ORDER — ACETAMINOPHEN 650 MG RE SUPP
650.0000 mg | Freq: Four times a day (QID) | RECTAL | Status: DC | PRN
Start: 2018-01-13 — End: 2018-01-17

## 2018-01-13 MED ORDER — THIAMINE HCL 100 MG PO TABS: 100.0000 mg | ORAL_TABLET | Freq: Every day | ORAL | Status: DC

## 2018-01-13 MED ORDER — LORAZEPAM 2 MG/ML IJ SOLN
0.0000 mg | Freq: Four times a day (QID) | INTRAMUSCULAR | Status: DC
  Administered 2018-01-13: 2 mg via INTRAVENOUS
  Filled 2018-01-13: qty 1

## 2018-01-13 MED ORDER — CHLORDIAZEPOXIDE HCL 25 MG PO CAPS
25.0000 mg | ORAL_CAPSULE | Freq: Four times a day (QID) | ORAL | Status: AC
  Administered 2018-01-13: 25 mg via ORAL
  Filled 2018-01-13: qty 1

## 2018-01-13 MED ORDER — LORAZEPAM 1 MG PO TABS
1.0000 mg | ORAL_TABLET | Freq: Four times a day (QID) | ORAL | Status: AC | PRN
  Administered 2018-01-15 (×2): 1 mg via ORAL
  Filled 2018-01-13 (×2): qty 1

## 2018-01-13 MED ORDER — ACETAMINOPHEN 325 MG PO TABS
650.0000 mg | ORAL_TABLET | Freq: Four times a day (QID) | ORAL | Status: DC | PRN
  Administered 2018-01-14 – 2018-01-16 (×2): 650 mg via ORAL
  Filled 2018-01-13 (×3): qty 2

## 2018-01-13 MED ORDER — CHLORDIAZEPOXIDE HCL 25 MG PO CAPS
25.0000 mg | ORAL_CAPSULE | Freq: Three times a day (TID) | ORAL | Status: AC
  Administered 2018-01-13: 25 mg via ORAL
  Filled 2018-01-13: qty 1

## 2018-01-13 MED ORDER — FOLIC ACID 1 MG PO TABS
1.0000 mg | ORAL_TABLET | Freq: Every day | ORAL | Status: DC
Start: 2018-01-14 — End: 2018-01-17
  Administered 2018-01-14 – 2018-01-17 (×4): 1 mg via ORAL
  Filled 2018-01-13 (×4): qty 1

## 2018-01-13 MED ORDER — MECLIZINE HCL 12.5 MG PO TABS: 12.5000 mg | ORAL_TABLET | Freq: Three times a day (TID) | ORAL | 0 refills | Status: DC | PRN

## 2018-01-13 MED ORDER — CHLORDIAZEPOXIDE HCL 25 MG PO CAPS
25.0000 mg | ORAL_CAPSULE | Freq: Two times a day (BID) | ORAL | Status: AC
  Administered 2018-01-14: 25 mg via ORAL
  Filled 2018-01-13: qty 1

## 2018-01-13 MED ORDER — ADULT MULTIVITAMIN W/MINERALS CH
1.0000 | ORAL_TABLET | Freq: Every day | ORAL | Status: DC
  Administered 2018-01-14 – 2018-01-17 (×4): 1 via ORAL
  Filled 2018-01-13 (×4): qty 1

## 2018-01-13 MED ORDER — ONDANSETRON HCL 4 MG PO TABS: 4.0000 mg | ORAL_TABLET | Freq: Four times a day (QID) | ORAL | Status: DC | PRN

## 2018-01-13 MED ORDER — GABAPENTIN 100 MG PO CAPS
200.0000 mg | ORAL_CAPSULE | Freq: Three times a day (TID) | ORAL | Status: DC
  Administered 2018-01-13 – 2018-01-17 (×11): 200 mg via ORAL
  Filled 2018-01-13 (×11): qty 2

## 2018-01-13 MED ORDER — ONDANSETRON HCL 8 MG PO TABS: 8.0000 mg | ORAL_TABLET | Freq: Three times a day (TID) | ORAL | 0 refills | Status: DC | PRN

## 2018-01-13 MED ORDER — HALOPERIDOL 2 MG PO TABS: 2.0000 mg | ORAL_TABLET | Freq: Every day | ORAL | Status: DC

## 2018-01-13 MED ORDER — LORAZEPAM 2 MG/ML IJ SOLN
1.0000 mg | Freq: Four times a day (QID) | INTRAMUSCULAR | Status: AC | PRN
  Administered 2018-01-14 – 2018-01-16 (×8): 1 mg via INTRAVENOUS
  Filled 2018-01-13 (×9): qty 1

## 2018-01-13 NOTE — ED Notes (Signed)
Called 4th floor, RN that will be getting report is Iffy, Caryl Pina RN suggested calling back around [phone] after shift change to give report.

## 2018-01-13 NOTE — ED Notes (Signed)
Bed: WA05 Expected date:  Expected time:  Means of arrival:  Comments: 

## 2018-01-13 NOTE — ED Triage Notes (Signed)
Per [facility] pt having weakness with repeated falls today. C/o neck pain and posterior head pains. hasnt been feeling well in past week and out of some of her daily medications. IV 22g left forearm Zofran 4mg  and NS 225ml given in route.

## 2018-01-13 NOTE — ED Notes (Signed)
Pt states she fell and hit head 3 times yesterday.

## 2018-01-13 NOTE — Care Management (Signed)
EDCM reviewed CM consult. Patient is being admitted. Unit CM to follow for discharge medication needs. Venita Sheffield RN CCM

## 2018-01-13 NOTE — ED Notes (Signed)
Attempted to call Iffy for report at [phone], no answer

## 2018-01-13 NOTE — ED Notes (Addendum)
Date and time results received: 01/13/18 12:46 PM   Test: ETOH Critical Value: 363  Name of Provider Notified: Eulis Foster

## 2018-01-13 NOTE — ED Notes (Signed)
Pt was ambulated and she states that she was feeling dizzy, also pt was wobbling couldn't walk straight, almost ran into the wall with her eyes closed, then I had to ambulate pt holding her hand/arm so she wouldn't fall or run into the wall. Pt had a hard time keeping her eyes open also. [place], Armoni Depass NT

## 2018-01-13 NOTE — ED Notes (Signed)
Unwilling to lay in bed at this time, pt repeadly asked multiple times to get back in bad to not fall. Pt unwilling to do so. MD Eulis Foster informed of behavior.

## 2018-01-13 NOTE — ED Provider Notes (Addendum)
[facility] DEPT Provider Note   CSN: [id number] Arrival date & time: 01/13/18  1055     History   Chief Complaint Chief Complaint  Patient presents with  . Weakness  . Fall    HPI Briana Sweeney is a 47 y.o. female.  HPI   The patient presents for evaluation of multiple falls, confusion and sleepiness.  Police were called to her home today for a well person check.  They apparently did a alcohol breathalyzer and show that she was intoxicated, "3.2."  On arrival in the ED patient was agitated and kept getting out of bed.  Was found to have an elevated see was score so was treated with Ativan.  Patient complains of pain in the back of her head and upper neck.  She is a poor historian and cannot tell me why she is here or what happened this morning.  Level 5 caveat-altered mental status  Past Medical History:  Diagnosis Date  . Anxiety   . Bipolar affective disorder ([place])    With anxiety features  . 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 Depression   . GERD (gastroesophageal reflux disease)   . Hepatitis C 2018   hepatitis c and alcohol related hepatitis  . History of blood transfusion    "blood doesn't clot; I fell down and had to have a transfusion"  . History of kidney stones   . Migraine    "when I get really stressed" (09/01/2017)  . Schizophrenia ([place])   . Seizures ([place])    "when I run out of my RX; lots recently" (09/01/2017)    Patient Active Problem List   Diagnosis Date Noted  . Chronic anemia   . Thrombocytopenia due to drugs   . Suicidal ideation   . Thrombocytopenia ([place]) 01/02/2018  . GERD (gastroesophageal reflux disease) 11/01/2017  . Trichimoniasis 10/30/2017  . Alcohol dependence ([place]) 10/29/2017  . MDD (major depressive disorder), severe ([place]) 10/28/2017  . Acute hyperactive alcohol withdrawal delirium ([place]) 10/09/2017  . Schizophrenia ([place]) 10/09/2017  . Alcohol abuse with alcohol-induced  mood disorder ([place])   . Alcohol withdrawal syndrome with complication ([place]) 78/67/6720  . 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 Hematemesis 09/01/2017  . Ascites due to alcoholic cirrhosis ([place])   . Decompensated hepatic cirrhosis ([place]) 07/23/2017  . Alcohol abuse 07/23/2017  . Hepatitis C antibody positive in blood 07/23/2017  . Hypokalemia 07/23/2017  . Jaundice 07/23/2017  . Coagulopathy ([place]) 07/23/2017  . Hypomagnesemia 07/23/2017  . Pancytopenia ([place]) 07/23/2017  . Alcoholic cirrhosis of liver with ascites ([place]) 07/23/2017    Past Surgical History:  Procedure Laterality Date  . ESOPHAGOGASTRODUODENOSCOPY N/A 09/03/2017   Procedure: ESOPHAGOGASTRODUODENOSCOPY (EGD);  Surgeon: Doran Stabler, MD;  Location: [facility];  Service: Gastroenterology;  Laterality: N/A;  . FINGER FRACTURE SURGERY Left    "shattered my pinky"  . FRACTURE SURGERY    . IR PARACENTESIS  07/23/2017  . IR PARACENTESIS  07/2017   "did it twice in the same week" (09/01/2017)  . SHOULDER OPEN ROTATOR CUFF REPAIR Right   . TUBAL LIGATION    . VAGINAL HYSTERECTOMY       OB History   None      Home Medications    Prior to Admission medications   Medication Sig Start Date End Date Taking? Authorizing Provider  busPIRone (BUSPAR) 10 MG tablet Take 1 tablet (10 mg total) by mouth 2 (  two) times daily. 01/08/18  Yes Eugenie Filler, MD  famotidine (PEPCID) 20 MG tablet Take 1 tablet (20 mg total) by mouth 2 (two) times daily. 01/08/18  Yes Eugenie Filler, MD  FLUoxetine (PROZAC) 20 MG capsule Take 1 capsule (20 mg total) by mouth daily. For mood control 01/08/18  Yes Eugenie Filler, MD  folic acid (FOLVITE) 1 MG tablet Take 1 tablet (1 mg total) by mouth daily. 01/09/18  Yes Eugenie Filler, MD  furosemide (LASIX) 20 MG tablet Take 1 tablet (20 mg total) by mouth daily. 01/08/18  Yes Eugenie Filler, MD  gabapentin (NEURONTIN) 100 MG capsule Take 2 capsules (200 mg total) by mouth 3  (three) times daily. 01/08/18  Yes Eugenie Filler, MD  haloperidol (HALDOL) 2 MG tablet Take 1 tablet (2 mg total) by mouth at bedtime. 01/08/18  Yes Eugenie Filler, MD  hydrOXYzine (ATARAX/VISTARIL) 25 MG tablet Take 1 tablet (25 mg total) by mouth 3 (three) times daily as needed for anxiety. 01/08/18  Yes Eugenie Filler, MD  lactulose (CHRONULAC) 10 GM/15ML solution Take 30 mLs (20 g total) by mouth daily. 01/09/18  Yes Eugenie Filler, MD  Multiple Vitamin (MULTIVITAMIN WITH MINERALS) TABS tablet Take 1 tablet by mouth daily. 01/09/18  Yes Eugenie Filler, MD  spironolactone (ALDACTONE) 50 MG tablet Take 1 tablet (50 mg total) by mouth daily. 01/08/18  Yes Eugenie Filler, MD  thiamine 100 MG tablet Take 1 tablet (100 mg total) by mouth daily. 01/09/18  Yes Eugenie Filler, MD  topiramate (TOPAMAX) 50 MG tablet Take 1 tablet (50 mg total) by mouth daily. 01/08/18  Yes Eugenie Filler, MD  traMADol (ULTRAM) 50 MG tablet Take 1 tablet (50 mg total) by mouth every 6 (six) hours as needed for moderate pain. 01/08/18  Yes Eugenie Filler, MD  traZODone (DESYREL) 50 MG tablet Take 1 tablet (50 mg total) by mouth at bedtime as needed for sleep. 01/08/18  Yes Eugenie Filler, MD  chlordiazePOXIDE (LIBRIUM) 10 MG capsule Take 1 capsule (10 mg total) by mouth daily for 3 days. 01/18/18 01/21/18  Hosie Poisson, MD  diazepam (VALIUM) 5 MG tablet Take 0.5 tablets (2.5 mg total) by mouth every 12 (twelve) hours as needed for up to 3 days for anxiety. 01/17/18 01/20/18  Hosie Poisson, MD  traMADol (ULTRAM) 50 MG tablet Take 1 tablet (50 mg total) by mouth every 6 (six) hours as needed for up to 3 days for moderate pain. 01/17/18 01/20/18  Hosie Poisson, MD    Family History Family History  Problem Relation Age of Onset  . Lung cancer Mother 99  . Alcohol abuse Mother   . Throat cancer Father 53    Social History Social History   Tobacco Use  . Smoking status: Never Smoker  . Smokeless tobacco:  Never Used  Substance Use Topics  . Alcohol use: Yes    Alcohol/week: 16.8 oz    Types: 28 Cans of beer per week    Comment: weekly "I have cut back"  . Drug use: No     Allergies   Patient has no known allergies.   Review of Systems Review of Systems  Unable to perform ROS: Mental status change     Physical Exam Updated Vital Signs BP 121/89 (BP Location: Right Arm)   Pulse (!) 106   Temp 98.8 F (37.1 C) (Oral)   Resp 18   Ht 4\' 11"  (1.499 m)  Wt 57.6 kg (127 lb)   LMP  (LMP Unknown)   SpO2 99%   BMI 25.65 kg/m   Physical Exam  Constitutional: She is oriented to person, place, and time. She appears well-developed and well-nourished. No distress.  HENT:  Head: Normocephalic and atraumatic.  Eyes: Pupils are equal, round, and reactive to light. Conjunctivae and EOM are normal.  Neck: Normal range of motion and phonation normal. Neck supple.  Cardiovascular: Normal rate and regular rhythm.  Pulmonary/Chest: Effort normal and breath sounds normal. She exhibits no tenderness.  Abdominal: Soft. She exhibits no distension. There is no tenderness. There is no guarding.  Musculoskeletal: Normal range of motion.  Neurological: She is alert and oriented to person, place, and time. She exhibits normal muscle tone.  She is dysarthric.  No clear receptive or expressive aphasia.  Skin: Skin is warm and dry.  Psychiatric: Her behavior is normal.  Mildly anxious.  Nursing note and vitals reviewed.    ED Treatments / Results  Labs (all labs ordered are listed, but only abnormal results are displayed) Labs Reviewed  BASIC METABOLIC PANEL - Abnormal; Notable for the following components:      Result Value   Glucose, Bld 112 (*)    BUN 5 (*)    Calcium 8.1 (*)    All other components within normal limits  CBC - Abnormal; Notable for the following components:   WBC 3.6 (*)    RBC 3.86 (*)    Hemoglobin 11.5 (*)    HCT 35.6 (*)    RDW 20.2 (*)    Platelets 45 (*)     All other components within normal limits  URINALYSIS, ROUTINE W REFLEX MICROSCOPIC - Abnormal; Notable for the following components:   Color, Urine STRAW (*)    Specific Gravity, Urine 1.002 (*)    All other components within normal limits  HEPATIC FUNCTION PANEL - Abnormal; Notable for the following components:   Total Protein 8.3 (*)    Albumin 3.4 (*)    AST 65 (*)    Alkaline Phosphatase 160 (*)    All other components within normal limits  ETHANOL - Abnormal; Notable for the following components:   Alcohol, Ethyl (B) 363 (*)    All other components within normal limits  COMPREHENSIVE METABOLIC PANEL - Abnormal; Notable for the following components:   Calcium 8.3 (*)    Albumin 2.9 (*)    AST 61 (*)    Alkaline Phosphatase 139 (*)    All other components within normal limits  CBC - Abnormal; Notable for the following components:   WBC 2.1 (*)    RBC 3.43 (*)    Hemoglobin 10.1 (*)    HCT 31.6 (*)    RDW 19.7 (*)    Platelets 27 (*)    All other components within normal limits  CBC - Abnormal; Notable for the following components:   WBC 2.2 (*)    RBC 3.53 (*)    Hemoglobin 10.5 (*)    HCT 32.7 (*)    RDW 19.5 (*)    Platelets 23 (*)    All other components within normal limits  CBC - Abnormal; Notable for the following components:   WBC 2.0 (*)    RBC 3.47 (*)    Hemoglobin 10.2 (*)    HCT 32.1 (*)    RDW 18.9 (*)    Platelets 22 (*)    All other components within normal limits  BASIC METABOLIC PANEL - Abnormal;  Notable for the following components:   BUN 5 (*)    Calcium 8.6 (*)    All other components within normal limits  CBC WITH DIFFERENTIAL/PLATELET - Abnormal; Notable for the following components:   WBC 2.9 (*)    RBC 3.68 (*)    Hemoglobin 10.9 (*)    HCT 33.9 (*)    RDW 18.6 (*)    Platelets 33 (*)    Neutro Abs 1.3 (*)    All other components within normal limits  CBC WITH DIFFERENTIAL/PLATELET - Abnormal; Notable for the following components:     WBC 3.6 (*)    RDW 18.8 (*)    Platelets 42 (*)    All other components within normal limits  CBG MONITORING, ED - Abnormal; Notable for the following components:   Glucose-Capillary 20 (*)    All other components within normal limits  CBG MONITORING, ED - Abnormal; Notable for the following components:   Glucose-Capillary 110 (*)    All other components within normal limits  CBG MONITORING, ED - Abnormal; Notable for the following components:   Glucose-Capillary 104 (*)    All other components within normal limits  PREGNANCY, URINE  AMMONIA  PROTIME-INR  MAGNESIUM  POC URINE PREG, ED  TYPE AND SCREEN    EKG EKG Interpretation  Date/Time:  Tuesday Jan 13 2018 11:25:39 EDT Ventricular Rate:  105 PR Interval:    QRS Duration: 79 QT Interval:  361 QTC Calculation: 478 R Axis:   93 Text Interpretation:  Sinus tachycardia Consider right atrial enlargement Anteroseptal infarct, age indeterminate Artifact in lead(s) I aVL V2 since last tracing no significant change Confirmed by Daleen Bo [phone]) on 01/13/2018 11:49:54 AM   Radiology No results found.  Procedures .Critical Care Performed by: Daleen Bo, MD Authorized by: Daleen Bo, MD   Critical care provider statement:    Critical care time (minutes):  45   Critical care start time:  01/13/2018 11:45 AM   Critical care end time:  01/13/2018 3:15 PM   Critical care time was exclusive of:  Separately billable procedures and treating other patients   Critical care was time spent personally by me on the following activities:  Blood draw for specimens, development of treatment plan with patient or surrogate, discussions with consultants, evaluation of patient's response to treatment, examination of patient, obtaining history from patient or surrogate, ordering and performing treatments and interventions, ordering and review of laboratory studies, pulse oximetry, re-evaluation of patient's condition, review of old charts  and ordering and review of radiographic studies   (including critical care time)  Medications Ordered in ED Medications  LORazepam (ATIVAN) tablet 1 mg ( Oral See Alternative 01/16/18 1259)    Or  LORazepam (ATIVAN) injection 1 mg (1 mg Intravenous Given 01/16/18 1259)  0.9 %  sodium chloride infusion ( Intravenous Stopped 01/16/18 0540)  LORazepam (ATIVAN) injection 1 mg (1 mg Intravenous Given 01/13/18 1149)  chlordiazePOXIDE (LIBRIUM) capsule 25 mg (25 mg Oral Given 01/13/18 1917)    Followed by  chlordiazePOXIDE (LIBRIUM) capsule 25 mg (25 mg Oral Given 01/13/18 2152)    Followed by  chlordiazePOXIDE (LIBRIUM) capsule 25 mg (25 mg Oral Given 01/14/18 0910)    Followed by  chlordiazePOXIDE (LIBRIUM) capsule 10 mg (10 mg Oral Given 01/14/18 2111)  sodium chloride 0.9 % 1,000 mL with thiamine 505 mg, folic acid 1 mg, multivitamins adult 10 mL infusion ( Intravenous New Bag/Given 01/13/18 1916)     Initial Impression / Assessment and  Plan / ED Course  I have reviewed the triage vital signs and the nursing notes.  Pertinent labs & imaging results that were available during my care of the patient were reviewed by me and considered in my medical decision making (see chart for details).      No data found.  3:13 PM Reevaluation with update and discussion. After initial assessment and treatment, an updated evaluation reveals the patient is alert and cooperative sitting up, texting on her phone.  She is obviously tremulous.  Single dose of Librium ordered.  Patient states she has prescriptions at home for multiple medicines which she is out of.  She has been taking her Haldol but nothing else.  She reports that she is hearing voices better laughing, but not hearing command hallucinations.  She declines psychiatric consultation or TTS evaluation at this time findings discussed with patient and plan discussed.  She will be observed until sober and less tremulous. Daleen Bo   Medical Decision Making:  Evaluation consistent with falling from alcohol intoxication.  Patient has numerous visits here with very high alcohol levels.  Patient treated with Ativan for elevated CIWA, and rested comfortably afterwards.  CRITICAL CARE-yes Performed by: Daleen Bo   Plan-disposition per oncoming provider team after additional treatment in the ED.    Final Clinical Impressions(s) / ED Diagnoses   Final diagnoses:  Alcoholic intoxication without complication ([place])  Fall, initial encounter  Alcohol withdrawal syndrome with complication [facility])    ED Discharge Orders        Ordered    chlordiazePOXIDE (LIBRIUM) 10 MG capsule  Daily     01/17/18 1140    traMADol (ULTRAM) 50 MG tablet  Every 6 hours PRN     01/17/18 1140    diazepam (VALIUM) 5 MG tablet  Every 12 hours PRN     01/17/18 1140    Diet - low sodium heart healthy     01/17/18 1140    Discharge instructions    Comments:  Please follow up with PCP in one week.   01/17/18 1140    meclizine (ANTIVERT) 12.5 MG tablet  3 times daily PRN,   Status:  Discontinued     01/13/18 1456    ondansetron (ZOFRAN) 8 MG tablet  Every 8 hours PRN,   Status:  Discontinued     01/13/18 1457       Daleen Bo, MD 01/13/18 1523    Daleen Bo, MD 01/20/18 1020

## 2018-01-13 NOTE — ED Notes (Addendum)
ED TO INPATIENT HANDOFF REPORT  Name/Age/Gender Briana Sweeney 47 y.o. female  Code Status    Code Status Orders  (From admission, onward)        Start     Ordered   01/13/18 1806  Full code  Continuous     01/13/18 1808    Code Status History    Date Active Date Inactive Code Status Order ID Comments User Context   01/13/2018 1157 01/13/2018 1808 Full Code [id number]  Daleen Bo, MD ED   01/02/2018 2130 01/08/2018 2022 Full Code [id number]  Rise Patience, MD ED   01/02/2018 2057 01/02/2018 2102 Full Code [id number]  Street, [place], [place] ED   10/28/2017 1552 11/02/2017 1840 Full Code [id number]  Vicenta Aly, NP Inpatient   10/09/2017 0038 10/14/2017 1745 Full Code [id number]  Vianne Bulls, MD ED   09/11/2017 2234 09/16/2017 1711 Full Code [id number]  Karmen Bongo, MD Inpatient   09/01/2017 0159 09/04/2017 1436 Full Code [id number]  Vilma Prader, MD Inpatient   07/19/2017 2125 07/28/2017 2031 Full Code [id number]  Vilma Prader, MD ED      Home/SNF/Other Home  Chief Complaint Weakness  Level of Care/Admitting Diagnosis ED Disposition    ED Disposition Condition [place]: [facility] [[id number]]  Level of Care: Telemetry [5]  Admit to tele based on following criteria: Complex arrhythmia (Bradycardia/Tachycardia)  Diagnosis: Alcohol withdrawal ([place]) [291.81.ICD-9-CM]  Admitting Physician: Patrecia Pour, EDWIN [[id number]]  Attending Physician: Patrecia Pour, EDWIN [phone]  Estimated length of stay: past midnight tomorrow  Certification:: I certify this patient will need inpatient services for at least 2 midnights  PT Class (Do Not Modify): Inpatient [101]  PT Acc Code (Do Not Modify): Private [1]       Medical History Past Medical History:  Diagnosis Date  . Anxiety   . Bipolar affective disorder ([place])    With anxiety features  . 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 Depression   . GERD  (gastroesophageal reflux disease)   . Hepatitis C 2018   hepatitis c and alcohol related hepatitis  . History of blood transfusion    "blood doesn't clot; I fell down and had to have a transfusion"  . History of kidney stones   . Migraine    "when I get really stressed" (09/01/2017)  . Schizophrenia ([place])   . Seizures ([place])    "when I run out of my RX; lots recently" (09/01/2017)    Allergies No Known Allergies  IV Location/Drains/Wounds Patient Lines/Drains/Airways Status   Active Line/Drains/Airways    Name:   Placement date:   Placement time:   Site:   Days:   Peripheral IV 01/13/18 Right;Posterior;Upper Forearm   01/13/18    1206    Forearm   less than 1          Labs/Imaging Results for orders placed or performed during the hospital encounter of 01/13/18 (from the past 48 hour(s))  Urinalysis, Routine w reflex microscopic     Status: Abnormal   Collection Time: 01/13/18 11:43 AM  Result Value Ref Range   Color, Urine STRAW (A) YELLOW   APPearance CLEAR CLEAR   Specific Gravity, Urine 1.002 (L) 1.005 - 1.030   pH 7.0 5.0 - 8.0   Glucose, UA NEGATIVE NEGATIVE mg/dL   Hgb urine dipstick NEGATIVE NEGATIVE   Bilirubin Urine NEGATIVE NEGATIVE   Ketones, ur NEGATIVE NEGATIVE mg/dL  Protein, ur NEGATIVE NEGATIVE mg/dL   Nitrite NEGATIVE NEGATIVE   Leukocytes, UA NEGATIVE NEGATIVE    Comment: Performed at [facility] [address], [place], [place] [address]  Pregnancy, urine     Status: None   Collection Time: 01/13/18 11:43 AM  Result Value Ref Range   Preg Test, Ur NEGATIVE NEGATIVE    Comment:        THE SENSITIVITY OF THIS METHODOLOGY IS >20 mIU/mL. Performed at [facility], [place] [address] [place], [place] [address]   POC urine preg, ED (not at [facility])     Status: None   Collection Time: 01/13/18 11:47 AM  Result Value Ref Range   Preg Test, Ur NEGATIVE NEGATIVE    Comment:        THE SENSITIVITY OF THIS METHODOLOGY  IS >24 mIU/mL   CBG monitoring, ED     Status: Abnormal   Collection Time: 01/13/18 11:54 AM  Result Value Ref Range   Glucose-Capillary 20 (LL) 65 - 99 mg/dL   Comment 1 Notify RN   CBG monitoring, ED     Status: Abnormal   Collection Time: 01/13/18 11:56 AM  Result Value Ref Range   Glucose-Capillary 110 (H) 65 - 99 mg/dL  Basic metabolic panel     Status: Abnormal   Collection Time: 01/13/18 12:08 PM  Result Value Ref Range   Sodium 143 135 - 145 mmol/L   Potassium 3.7 3.5 - 5.1 mmol/L   Chloride 106 101 - 111 mmol/L   CO2 24 22 - 32 mmol/L   Glucose, Bld 112 (H) 65 - 99 mg/dL   BUN 5 (L) 6 - 20 mg/dL   Creatinine, Ser 0.52 0.44 - 1.00 mg/dL   Calcium 8.1 (L) 8.9 - 10.3 mg/dL   GFR calc non Af Amer >60 >60 mL/min   GFR calc Af Amer >60 >60 mL/min    Comment: (NOTE) The eGFR has been calculated using the CKD EPI equation. This calculation has not been validated in all clinical situations. eGFR's persistently <60 mL/min signify possible Chronic Kidney Disease.    Anion gap 13 5 - 15    Comment: Performed at [facility], [place] [address] [place], [place] [address]  CBC     Status: Abnormal   Collection Time: 01/13/18 12:08 PM  Result Value Ref Range   WBC 3.6 (L) 4.0 - 10.5 K/uL   RBC 3.86 (L) 3.87 - 5.11 MIL/uL   Hemoglobin 11.5 (L) 12.0 - 15.0 g/dL   HCT 35.6 (L) 36.0 - 46.0 %   MCV 92.2 78.0 - 100.0 fL   MCH 29.8 26.0 - 34.0 pg   MCHC 32.3 30.0 - 36.0 g/dL   RDW 20.2 (H) 11.5 - 15.5 %   Platelets 45 (L) 150 - 400 K/uL    Comment: REPEATED TO VERIFY SPECIMEN CHECKED FOR CLOTS PLATELET COUNT CONFIRMED BY SMEAR Performed at [facility] [address] [place], [place] [address]   Type and screen     Status: None   Collection Time: 01/13/18 12:08 PM  Result Value Ref Range   ABO/RH(D) O POS    Antibody Screen NEG    Sample Expiration      01/16/2018 Performed at [facility], [place] [address]  [place], [place] [address]   Ammonia     Status: None   Collection Time: 01/13/18 12:08 PM  Result Value Ref Range   Ammonia 32 9 -  35 umol/L    Comment: Performed at [facility], [place] [address] [place], [place] [address]  Hepatic function panel     Status: Abnormal   Collection Time: 01/13/18 12:08 PM  Result Value Ref Range   Total Protein 8.3 (H) 6.5 - 8.1 g/dL   Albumin 3.4 (L) 3.5 - 5.0 g/dL   AST 65 (H) 15 - 41 U/L   ALT 24 14 - 54 U/L   Alkaline Phosphatase 160 (H) 38 - 126 U/L   Total Bilirubin 1.1 0.3 - 1.2 mg/dL   Bilirubin, Direct 0.4 0.1 - 0.5 mg/dL   Indirect Bilirubin 0.7 0.3 - 0.9 mg/dL    Comment: Performed at [facility], [place] [address], [place], [place] [address]  Ethanol     Status: Abnormal   Collection Time: 01/13/18 12:08 PM  Result Value Ref Range   Alcohol, Ethyl (B) 363 (HH) <10 mg/dL    Comment:        LOWEST DETECTABLE LIMIT FOR SERUM ALCOHOL IS 10 mg/dL FOR MEDICAL PURPOSES ONLY CRITICAL RESULT CALLED TO, READ BACK BY AND VERIFIED WITH: S.LEONARD RN [phone] A.QUIZON Performed at [facility] [address], [place], [place] [address]   CBG monitoring, ED     Status: Abnormal   Collection Time: 01/13/18  4:00 PM  Result Value Ref Range   Glucose-Capillary 104 (H) 65 - 99 mg/dL   Ct Head Wo Contrast  Result Date: 01/13/2018 CLINICAL DATA:  Weakness and repeated falls EXAM: CT HEAD WITHOUT CONTRAST CT CERVICAL SPINE WITHOUT CONTRAST TECHNIQUE: Multidetector CT imaging of the head and cervical spine was performed following the standard protocol without intravenous contrast. Multiplanar CT image reconstructions of the cervical spine were also generated. COMPARISON:  01/03/2018 FINDINGS: CT HEAD FINDINGS Brain: No evidence of acute infarction, hemorrhage, hydrocephalus, extra-axial collection or mass lesion/mass effect. Vascular: No hyperdense vessel or unexpected calcification. Skull: Normal.  Negative for fracture or focal lesion. Sinuses/Orbits: No acute finding. Other: None. CT CERVICAL SPINE FINDINGS Alignment: Mild loss of the normal cervical lordosis is noted likely related to muscular spasm. Skull base and vertebrae: 7 cervical segments are well visualized. Vertebral body height is well maintained. Facet hypertrophic changes are noted bilaterally but worst on the left at C4-5. Osteophytic changes are noted at C4-5, C5-6 and C6-7. No acute fracture or acute facet abnormality is noted. The odontoid is within normal limits. Soft tissues and spinal canal: No focal soft tissue abnormality is noted. Upper chest: Within normal limits. Other: None IMPRESSION: CT of the head: No acute intracranial abnormality noted. CT of the cervical spine: Degenerative changes without acute abnormality. Electronically Signed   By: Inez Catalina M.D.   On: 01/13/2018 13:21   Ct Cervical Spine Wo Contrast  Result Date: 01/13/2018 CLINICAL DATA:  Weakness and repeated falls EXAM: CT HEAD WITHOUT CONTRAST CT CERVICAL SPINE WITHOUT CONTRAST TECHNIQUE: Multidetector CT imaging of the head and cervical spine was performed following the standard protocol without intravenous contrast. Multiplanar CT image reconstructions of the cervical spine were also generated. COMPARISON:  01/03/2018 FINDINGS: CT HEAD FINDINGS Brain: No evidence of acute infarction, hemorrhage, hydrocephalus, extra-axial collection or mass lesion/mass effect. Vascular: No hyperdense vessel or unexpected calcification. Skull: Normal. Negative for fracture or focal lesion. Sinuses/Orbits: No acute finding. Other: None. CT CERVICAL SPINE FINDINGS Alignment: Mild loss of the normal cervical lordosis is noted likely related to muscular spasm. Skull base and vertebrae: 7 cervical segments are well visualized. Vertebral  body height is well maintained. Facet hypertrophic changes are noted bilaterally but worst on the left at C4-5. Osteophytic changes are noted at  C4-5, C5-6 and C6-7. No acute fracture or acute facet abnormality is noted. The odontoid is within normal limits. Soft tissues and spinal canal: No focal soft tissue abnormality is noted. Upper chest: Within normal limits. Other: None IMPRESSION: CT of the head: No acute intracranial abnormality noted. CT of the cervical spine: Degenerative changes without acute abnormality. Electronically Signed   By: Inez Catalina M.D.   On: 01/13/2018 13:21    Pending Labs Unresulted Labs (From admission, onward)   Start     Ordered   01/14/18 0500  Comprehensive metabolic panel  Tomorrow morning,   R     01/13/18 1808   01/14/18 0500  CBC  Tomorrow morning,   R     01/13/18 1808   01/14/18 0500  Protime-INR  Tomorrow morning,   R     01/13/18 1808   01/14/18 0500  Magnesium  Tomorrow morning,   R     01/13/18 1834      Vitals/Pain Today's Vitals   01/13/18 1730 01/13/18 1800 01/13/18 1830 01/13/18 1836  BP: 119/80 (!) 127/101 112/80 112/80  Pulse: 67 84 76 75  Resp:    18  Temp:      TempSrc:      SpO2: 97% 100% 95% 95%  Weight:      Height:      PainSc:        Isolation Precautions No active isolations  Medications Medications  thiamine (VITAMIN B-1) tablet 100 mg ( Oral See Alternative 01/13/18 1326)    Or  thiamine (B-1) injection 100 mg (100 mg Intravenous Given 01/13/18 1326)  busPIRone (BUSPAR) tablet 10 mg (has no administration in time range)  famotidine (PEPCID) tablet 20 mg (has no administration in time range)  FLUoxetine (PROZAC) capsule 20 mg (has no administration in time range)  furosemide (LASIX) tablet 20 mg (has no administration in time range)  gabapentin (NEURONTIN) capsule 200 mg (has no administration in time range)  haloperidol (HALDOL) tablet 2 mg (has no administration in time range)  hydrOXYzine (ATARAX/VISTARIL) tablet 25 mg (has no administration in time range)  lactulose (CHRONULAC) 10 GM/15ML solution 20 g (has no administration in time range)   spironolactone (ALDACTONE) tablet 50 mg (has no administration in time range)  topiramate (TOPAMAX) tablet 50 mg (has no administration in time range)  traZODone (DESYREL) tablet 50 mg (has no administration in time range)  chlordiazePOXIDE (LIBRIUM) capsule 25 mg (has no administration in time range)    Followed by  chlordiazePOXIDE (LIBRIUM) capsule 25 mg (has no administration in time range)    Followed by  chlordiazePOXIDE (LIBRIUM) capsule 25 mg (has no administration in time range)    Followed by  chlordiazePOXIDE (LIBRIUM) capsule 10 mg (has no administration in time range)    Followed by  chlordiazePOXIDE (LIBRIUM) capsule 10 mg (has no administration in time range)  LORazepam (ATIVAN) tablet 1 mg (has no administration in time range)    Or  LORazepam (ATIVAN) injection 1 mg (has no administration in time range)  multivitamin with minerals tablet 1 tablet (has no administration in time range)  acetaminophen (TYLENOL) tablet 650 mg (has no administration in time range)    Or  acetaminophen (TYLENOL) suppository 650 mg (has no administration in time range)  ondansetron (ZOFRAN) tablet 4 mg (has no administration in time range)  Or  ondansetron (ZOFRAN) injection 4 mg (has no administration in time range)  sodium chloride 0.9 % 1,000 mL with thiamine 155 mg, folic acid 1 mg, multivitamins adult 10 mL infusion (has no administration in time range)  folic acid (FOLVITE) tablet 1 mg (has no administration in time range)  LORazepam (ATIVAN) injection 1 mg (1 mg Intravenous Given 01/13/18 1149)    Mobility Walks with assistance

## 2018-01-13 NOTE — ED Triage Notes (Signed)
Pt reports not having a drink since yesterday. Has not taken seizure medication x1 week.

## 2018-01-13 NOTE — ED Triage Notes (Signed)
EMS adds that police were on scene as it was initially called out for well person check and did ETOH breathalyzer on patient 3.2.

## 2018-01-13 NOTE — H&P (Signed)
History and Physical    Briana Sweeney JSE:[id number] DOB: 1971/07/20  DOA: 01/13/2018 PCP: Scot Jun, FNP  Patient coming from: Home  Chief Complaint: Fall, weakness  HPI: Briana Sweeney is a 46 y.o. female with medical history significant of alcohol abuse, liver cirrhosis, esophageal varices, pancytopenia, schizophrenia presented to the emergency department after was found by police well check intoxicated.  Upon ED arrival patient was agitated and felt that she was in alcohol withdrawal, therefore CIWA protocol was started with Ativan.  Attempted to sober up and discharged home was done, however patient continues to be unsteady, tremulous and lethargic.  Upon my interview, patient is lethargic and slow responsive.  Poor historian, what I was able to obtain was " that she ran out of her medication for about a week.  Had a seizure 2 days prior to admission and fall.  Subsequently she started drink more alcohol, last last night" patient was recently discharged on 5/22 after being admitted for alcohol intoxication and suicidal ideas.  Patient reported nausea, vomiting, anxiety, headache and weakness.  She denies chest pain, shortness of breath, diarrhea, dizziness and hallucinations.  Patient drinks heavy liquor daily.  ED Course: CIWA 17, was given Ativan.  Found to be hypoglycemic as well, she was ambulated and was ataxic and very tremorous.  Lab work-up showed  pancytopenia, elevated ALP, BAL 363.  CT head with no acute abnormalities.  Review of Systems:   General: no changes in body weight, no fever chills   HEENT: no blurry vision, hearing changes or sore throat Respiratory: no dyspnea, coughing, wheezing CV: no chest pain, no palpitations GI:  See HPI GU: no dysuria, burning on urination, increased urinary frequency, hematuria  Ext:. No deformities  Neuro: See HPI Skin: No rashes, lesions or wounds. MSK: No muscle spasm, no deformity, no limitation of range of movement in  spin Heme: No easy bruising.  Travel history: No recent long distant travel.   Past Medical History:  Diagnosis Date  . Anxiety   . Bipolar affective disorder ([place])    With anxiety features  . Cirrhosis of liver ([place])    Due to alcohol and hepatitis C  . Depression   . GERD (gastroesophageal reflux disease)   . Hepatitis C 2018   hepatitis c and alcohol related hepatitis  . History of blood transfusion    "blood doesn't clot; I fell down and had to have a transfusion"  . History of kidney stones   . Migraine    "when I get really stressed" (09/01/2017)  . Schizophrenia ([place])   . Seizures ([place])    "when I run out of my RX; lots recently" (09/01/2017)    Past Surgical History:  Procedure Laterality Date  . ESOPHAGOGASTRODUODENOSCOPY N/A 09/03/2017   Procedure: ESOPHAGOGASTRODUODENOSCOPY (EGD);  Surgeon: Doran Stabler, MD;  Location: [facility];  Service: Gastroenterology;  Laterality: N/A;  . FINGER FRACTURE SURGERY Left    "shattered my pinky"  . FRACTURE SURGERY    . IR PARACENTESIS  07/23/2017  . IR PARACENTESIS  07/2017   "did it twice in the same week" (09/01/2017)  . SHOULDER OPEN ROTATOR CUFF REPAIR Right   . TUBAL LIGATION    . VAGINAL HYSTERECTOMY       reports that she has never smoked. She has never used smokeless tobacco. She reports that she drinks about 16.8 oz of alcohol per week. She reports that she does not use drugs.  No Known Allergies  Family History  Problem Relation Age of Onset  . Lung cancer Mother 32  . Alcohol abuse Mother   . Throat cancer Father 62    Prior to Admission medications   Medication Sig Start Date End Date Taking? Authorizing Provider  busPIRone (BUSPAR) 10 MG tablet Take 1 tablet (10 mg total) by mouth 2 (two) times daily. 01/08/18  Yes Eugenie Filler, MD  famotidine (PEPCID) 20 MG tablet Take 1 tablet (20 mg total) by mouth 2 (two) times daily. 01/08/18  Yes Eugenie Filler, MD  FLUoxetine (PROZAC) 20 MG  capsule Take 1 capsule (20 mg total) by mouth daily. For mood control 01/08/18  Yes Eugenie Filler, MD  folic acid (FOLVITE) 1 MG tablet Take 1 tablet (1 mg total) by mouth daily. 01/09/18  Yes Eugenie Filler, MD  furosemide (LASIX) 20 MG tablet Take 1 tablet (20 mg total) by mouth daily. 01/08/18  Yes Eugenie Filler, MD  gabapentin (NEURONTIN) 100 MG capsule Take 2 capsules (200 mg total) by mouth 3 (three) times daily. 01/08/18  Yes Eugenie Filler, MD  haloperidol (HALDOL) 2 MG tablet Take 1 tablet (2 mg total) by mouth at bedtime. 01/08/18  Yes Eugenie Filler, MD  hydrOXYzine (ATARAX/VISTARIL) 25 MG tablet Take 1 tablet (25 mg total) by mouth 3 (three) times daily as needed for anxiety. 01/08/18  Yes Eugenie Filler, MD  lactulose (CHRONULAC) 10 GM/15ML solution Take 30 mLs (20 g total) by mouth daily. 01/09/18  Yes Eugenie Filler, MD  Multiple Vitamin (MULTIVITAMIN WITH MINERALS) TABS tablet Take 1 tablet by mouth daily. 01/09/18  Yes Eugenie Filler, MD  spironolactone (ALDACTONE) 50 MG tablet Take 1 tablet (50 mg total) by mouth daily. 01/08/18  Yes Eugenie Filler, MD  thiamine 100 MG tablet Take 1 tablet (100 mg total) by mouth daily. 01/09/18  Yes Eugenie Filler, MD  topiramate (TOPAMAX) 50 MG tablet Take 1 tablet (50 mg total) by mouth daily. 01/08/18  Yes Eugenie Filler, MD  traMADol (ULTRAM) 50 MG tablet Take 1 tablet (50 mg total) by mouth every 6 (six) hours as needed for moderate pain. 01/08/18  Yes Eugenie Filler, MD  traZODone (DESYREL) 50 MG tablet Take 1 tablet (50 mg total) by mouth at bedtime as needed for sleep. 01/08/18  Yes Eugenie Filler, MD    Physical Exam: Vitals:   01/13/18 1600 01/13/18 1630 01/13/18 1700 01/13/18 1730  BP: (!) 123/92 120/83 110/82 119/80  Pulse:  80 82 67  Resp:  14 14   Temp:      TempSrc:      SpO2:  94% 97% 97%  Weight:      Height:         Constitutional: Mildly anxious, somewhat sedated Eyes: PERRL, lids and  conjunctivae normal ENMT: Mucous membranes are moist. Posterior pharynx clear of any exudate or lesions. Neck: normal, supple, no masses, no thyromegaly Respiratory: clear to auscultation bilaterally, no wheezing, no crackles. Normal respiratory effort. Cardiovascular: Regular rate and rhythm, no murmurs / rubs / gallops. No extremity edema. 2+ pedal pulses. No carotid bruits.  Abdomen: no tenderness, no masses palpated. No hepatosplenomegaly. Bowel sounds positive.  Musculoskeletal: no clubbing / cyanosis. No joint deformity upper and lower extremities. Good ROM Skin: no rashes Neurologic: CN 2-12 grossly intact.  Tremors, sleepy but oriented x 3 Psychiatric: Mood and affect flat  Labs on Admission: I have personally reviewed following labs and imaging studies  CBC: Recent  Labs  Lab 01/07/18 0814 01/08/18 0423 01/13/18 1208  WBC 3.0* 3.7* 3.6*  NEUTROABS 1.5* 2.1  --   HGB 10.3* 10.1* 11.5*  HCT 32.5* 31.8* 35.6*  MCV 93.9 93.3 92.2  PLT 17* 21* 45*   Basic Metabolic Panel: Recent Labs  Lab 01/07/18 0935 01/08/18 0423 01/13/18 1208  NA 137 136 143  K 3.6 4.2 3.7  CL 108 106 106  CO2 21* 22 24  GLUCOSE 153* 113* 112*  BUN 11 8 5*  CREATININE 0.56 0.51 0.52  CALCIUM 8.7* 8.6* 8.1*  MG 1.7 2.0  --    GFR: Estimated Creatinine Clearance: 68.7 mL/min (by C-G formula based on SCr of 0.52 mg/dL). Liver Function Tests: Recent Labs  Lab 01/07/18 0935 01/13/18 1208  AST 43* 65*  ALT 20 24  ALKPHOS 163* 160*  BILITOT 0.9 1.1  PROT 7.6 8.3*  ALBUMIN 2.8* 3.4*   No results for input(s): LIPASE, AMYLASE in the last 168 hours. Recent Labs  Lab 01/13/18 1208  AMMONIA 32   Coagulation Profile: Recent Labs  Lab 01/07/18 0935  INR 1.17   Cardiac Enzymes: No results for input(s): CKTOTAL, CKMB, CKMBINDEX, TROPONINI in the last 168 hours. BNP (last 3 results) No results for input(s): PROBNP in the last 8760 hours. HbA1C: No results for input(s): HGBA1C in the last  72 hours. CBG: Recent Labs  Lab 01/13/18 1154 01/13/18 1156 01/13/18 1600  GLUCAP 20* 110* 104*   Lipid Profile: No results for input(s): CHOL, HDL, LDLCALC, TRIG, CHOLHDL, LDLDIRECT in the last 72 hours. Thyroid Function Tests: No results for input(s): TSH, T4TOTAL, FREET4, T3FREE, THYROIDAB in the last 72 hours. Anemia Panel: No results for input(s): VITAMINB12, FOLATE, FERRITIN, TIBC, IRON, RETICCTPCT in the last 72 hours. Urine analysis:    Component Value Date/Time   COLORURINE STRAW (A) 01/13/2018 1143   APPEARANCEUR CLEAR 01/13/2018 1143   LABSPEC 1.002 (L) 01/13/2018 1143   PHURINE 7.0 01/13/2018 1143   GLUCOSEU NEGATIVE 01/13/2018 1143   HGBUR NEGATIVE 01/13/2018 1143   BILIRUBINUR NEGATIVE 01/13/2018 1143   KETONESUR NEGATIVE 01/13/2018 1143   PROTEINUR NEGATIVE 01/13/2018 1143   UROBILINOGEN 0.2 12/02/2017 0836   NITRITE NEGATIVE 01/13/2018 1143   LEUKOCYTESUR NEGATIVE 01/13/2018 1143   Sepsis Labs: !!!!!!!!!!!!!!!!!!!!!!!!!!!!!!!!!!!!!!!!!!!! @LABRCNTIP (procalcitonin:4,lacticidven:4) )No results found for this or any previous visit (from the past 240 hour(s)).   Radiological Exams on Admission: Ct Head Wo Contrast  Result Date: 01/13/2018 CLINICAL DATA:  Weakness and repeated falls EXAM: CT HEAD WITHOUT CONTRAST CT CERVICAL SPINE WITHOUT CONTRAST TECHNIQUE: Multidetector CT imaging of the head and cervical spine was performed following the standard protocol without intravenous contrast. Multiplanar CT image reconstructions of the cervical spine were also generated. COMPARISON:  01/03/2018 FINDINGS: CT HEAD FINDINGS Brain: No evidence of acute infarction, hemorrhage, hydrocephalus, extra-axial collection or mass lesion/mass effect. Vascular: No hyperdense vessel or unexpected calcification. Skull: Normal. Negative for fracture or focal lesion. Sinuses/Orbits: No acute finding. Other: None. CT CERVICAL SPINE FINDINGS Alignment: Mild loss of the normal cervical lordosis  is noted likely related to muscular spasm. Skull base and vertebrae: 7 cervical segments are well visualized. Vertebral body height is well maintained. Facet hypertrophic changes are noted bilaterally but worst on the left at C4-5. Osteophytic changes are noted at C4-5, C5-6 and C6-7. No acute fracture or acute facet abnormality is noted. The odontoid is within normal limits. Soft tissues and spinal canal: No focal soft tissue abnormality is noted. Upper chest: Within normal limits. Other: None IMPRESSION:  CT of the head: No acute intracranial abnormality noted. CT of the cervical spine: Degenerative changes without acute abnormality. Electronically Signed   By: Inez Catalina M.D.   On: 01/13/2018 13:21   Ct Cervical Spine Wo Contrast  Result Date: 01/13/2018 CLINICAL DATA:  Weakness and repeated falls EXAM: CT HEAD WITHOUT CONTRAST CT CERVICAL SPINE WITHOUT CONTRAST TECHNIQUE: Multidetector CT imaging of the head and cervical spine was performed following the standard protocol without intravenous contrast. Multiplanar CT image reconstructions of the cervical spine were also generated. COMPARISON:  01/03/2018 FINDINGS: CT HEAD FINDINGS Brain: No evidence of acute infarction, hemorrhage, hydrocephalus, extra-axial collection or mass lesion/mass effect. Vascular: No hyperdense vessel or unexpected calcification. Skull: Normal. Negative for fracture or focal lesion. Sinuses/Orbits: No acute finding. Other: None. CT CERVICAL SPINE FINDINGS Alignment: Mild loss of the normal cervical lordosis is noted likely related to muscular spasm. Skull base and vertebrae: 7 cervical segments are well visualized. Vertebral body height is well maintained. Facet hypertrophic changes are noted bilaterally but worst on the left at C4-5. Osteophytic changes are noted at C4-5, C5-6 and C6-7. No acute fracture or acute facet abnormality is noted. The odontoid is within normal limits. Soft tissues and spinal canal: No focal soft tissue  abnormality is noted. Upper chest: Within normal limits. Other: None IMPRESSION: CT of the head: No acute intracranial abnormality noted. CT of the cervical spine: Degenerative changes without acute abnormality. Electronically Signed   By: Inez Catalina M.D.   On: 01/13/2018 13:21   EKG: Independently reviewed.  Sinus tachycardia, significant artifact  Assessment/Plan Alcohol abuse with withdrawal Admit to inpatient/telemetry, BAL 363 Patient tolerating diet well with start Librium taper and CIWA Ativan protocol PRN. Banana bag.  Continue to monitor closely.  PT eval in a.m. Social worker consult for outpatient resources  Alcoholic cirrhosis Will obtain ammonia levels, continue lactulose, Aldactone and Lasix Does not seem to be fluid overloaded.  Monitor electrolytes, LFTs and coags  Pancytopenia Due to alcohol and cirrhosis No signs of overt bleeding, platelet 45K, trending up from 5/2 Continue to monitor closely  Hypoglycemia Initial CBG was 20, likely related to malnutrition from alcoholism Patient on banana bag, monitor CBGs. Dietitian consult  Fall ? Seizure Likely due to intoxication/tremors CT head negative  Schizophrenia/depression Stable, no SI/HI Continue home medication.  Patient will need prescription refill upon discharge.  DVT prophylaxis: SCDs Code Status: Full code Family Communication: None at bedside Disposition Plan: Anticipate discharge to previous home environment.  Consults called: None Admission status: Inpatient/telemetry  Chipper Oman MD [facility] Pager: Text Page via [email or web address]  [phone]  If 7PM-7AM, please contact night-coverage [email or web address] Password [facility]  01/13/2018, 5:44 PM

## 2018-01-13 NOTE — ED Provider Notes (Signed)
3:50 PM Care transferred to me. Patient has CIWA of 17 per nurse. On my exam she is awake, alert, but having significant tremor. Will be given CIWA per protocol, plan to reevaluate  5:44 PM Patient is ataxic and almost ran into wall when walking.  There is concern for alcohol withdrawal even though she has obviously ingested some alcohol today.  At this point, she does not appear stable for discharge will need continued benzodiazepines and supportive care.  Discussed with hospitalist who will admit.   Sherwood Gambler, MD 01/13/18 1745

## 2018-01-14 ENCOUNTER — Encounter (HOSPITAL_COMMUNITY): Payer: Self-pay | Admitting: Hematology & Oncology

## 2018-01-14 DIAGNOSIS — F10920 Alcohol use, unspecified with intoxication, uncomplicated: Secondary | ICD-10-CM

## 2018-01-14 DIAGNOSIS — D696 Thrombocytopenia, unspecified: Secondary | ICD-10-CM

## 2018-01-14 DIAGNOSIS — F10239 Alcohol dependence with withdrawal, unspecified: Secondary | ICD-10-CM

## 2018-01-14 LAB — MAGNESIUM: Magnesium: 1.7 mg/dL (ref 1.7–2.4)

## 2018-01-14 LAB — COMPREHENSIVE METABOLIC PANEL
ALT: 23 U/L (ref 14–54)
AST: 61 U/L — ABNORMAL HIGH (ref 15–41)
Albumin: 2.9 g/dL — ABNORMAL LOW (ref 3.5–5.0)
Alkaline Phosphatase: 139 U/L — ABNORMAL HIGH (ref 38–126)
Anion gap: 9 (ref 5–15)
BUN: 6 mg/dL (ref 6–20)
CO2: 23 mmol/L (ref 22–32)
Calcium: 8.3 mg/dL — ABNORMAL LOW (ref 8.9–10.3)
Chloride: 104 mmol/L (ref 101–111)
Creatinine, Ser: 0.56 mg/dL (ref 0.44–1.00)
GFR calc Af Amer: >60 mL/min (ref >60)
GFR calc non Af Amer: >60 mL/min (ref >60)
Glucose, Bld: 90 mg/dL (ref 65–99)
Potassium: 3.9 mmol/L (ref 3.5–5.1)
Sodium: 136 mmol/L (ref 135–145)
Total Bilirubin: 1.1 mg/dL (ref 0.3–1.2)
Total Protein: 7.3 g/dL (ref 6.5–8.1)

## 2018-01-14 LAB — CBC
HCT: 31.6 % — ABNORMAL LOW (ref 36.0–46.0)
HCT: 32.7 % — ABNORMAL LOW (ref 36.0–46.0)
Hemoglobin: 10.1 g/dL — ABNORMAL LOW (ref 12.0–15.0)
Hemoglobin: 10.5 g/dL — ABNORMAL LOW (ref 12.0–15.0)
MCH: 29.4 pg (ref 26.0–34.0)
MCH: 29.7 pg (ref 26.0–34.0)
MCHC: 32 g/dL (ref 30.0–36.0)
MCHC: 32.1 g/dL (ref 30.0–36.0)
MCV: 92.1 fL (ref 78.0–100.0)
MCV: 92.6 fL (ref 78.0–100.0)
Platelets: 27 10*3/uL — CL (ref 150–400)
Platelets: 23 10*3/uL — CL (ref 150–400)
RBC: 3.43 MIL/uL — ABNORMAL LOW (ref 3.87–5.11)
RBC: 3.53 MIL/uL — ABNORMAL LOW (ref 3.87–5.11)
RDW: 19.7 % — ABNORMAL HIGH (ref 11.5–15.5)
RDW: 19.5 % — ABNORMAL HIGH (ref 11.5–15.5)
WBC: 2.1 10*3/uL — ABNORMAL LOW (ref 4.0–10.5)
WBC: 2.2 10*3/uL — ABNORMAL LOW (ref 4.0–10.5)

## 2018-01-14 LAB — PROTIME-INR
INR: 1.14
Prothrombin Time: 14.5 seconds (ref 11.4–15.2)

## 2018-01-14 MED ORDER — BOOST / RESOURCE BREEZE PO LIQD CUSTOM
1.0000 | Freq: Two times a day (BID) | ORAL | Status: DC
  Administered 2018-01-14 – 2018-01-17 (×5): 1 via ORAL

## 2018-01-14 MED ORDER — SODIUM CHLORIDE 0.9 % IV SOLN
INTRAVENOUS | Status: AC
  Administered 2018-01-14 – 2018-01-15 (×3): via INTRAVENOUS

## 2018-01-14 MED ORDER — DIAZEPAM 5 MG PO TABS
2.5000 mg | ORAL_TABLET | Freq: Three times a day (TID) | ORAL | Status: DC
  Administered 2018-01-14 – 2018-01-17 (×11): 2.5 mg via ORAL
  Filled 2018-01-14 (×11): qty 1

## 2018-01-14 NOTE — Progress Notes (Signed)
PROGRESS NOTE                                                                                                                                                                                                             Patient Demographics:    Briana Sweeney, is a 47 y.o. female, DOB - 10-19-1970, WJX:[id number]  Admit date - 01/13/2018   Admitting Physician Doreatha Lew, MD  Outpatient Primary MD for the patient is Scot Jun, [place]  LOS - 1  Outpatient Specialists: NONE  Chief Complaint  Patient presents with  . Weakness  . Fall       Brief Narrative 47 year old female with active alcohol abuse, alcoholic liver cirrhosis with history of esophageal varices, pancytopenia, schizophrenia brought into the ED by police when found to be intoxicated.  In the ED she was found to be in alcohol withdrawal and placed on CIWA.  Patient could not be discharged home as she was unsteady, lethargic and tremulous. Patient reported having seizures 2 days prior to admission and a fall at home.  She then started drinking more alcohol (mainly liquor and beer). She was just discharged 5-day prior to admission after being admitted for a week with alcohol withdrawal. Patient was mildly tachycardic and hypertensive in the ED.  Blood work showed pancytopenia with platelets of 45 (had improved from recent hospitalization).  Blood alcohol level was 363. Patient admitted for acute alcohol withdrawal symptoms.   Subjective:   Patient is tremulous and reports occasionally seeing rainy images on the wall.  Denies any dizziness, nausea or vomiting.   Assessment  & Plan :   Principal problem Alcohol abuse with withdrawal. Patient started on banana bag on admission.  Will switch to normal saline once completed.  Started on Librium taper on admission.[name] Kitchen  Has CIWA of 14 this morning.  I have added on low-dose scheduled Valium. High risk of going  into DTs. Continue thiamine, folate and multivitamin.  Seizure precautions (given recent?  Alcohol-related seizures at home)  Counseled strongly on alcohol cessation.  Alcoholic cirrhosis of liver. Continue lactulose, Lasix and Aldactone.  History of esophageal varices.  No signs of ascites or fluid overload.  Monitor closely.  Pancytopenia. Secondary to active alcohol use and cirrhosis.  Platelets of 25 this morning.  No signs of active bleeding.  (Platelets were in  the 20s during recent hospitalization for the same and improved to 45).  Hypoglycemia CBG of 20s on presentation.  On banana bag.  Liberal diet.  Nutritionist consulted.  Depression and schizophrenia Had suicidal ideations during recent hospitalization.  In February 2019 was admitted to [facility] and reportedly noncompliant with her medications.  She was seen by psychiatry last week and initially recommended admission to [facility] but was later cleared.  She was started on Prozac and gabapentin with outpatient psych follow-up.  No suicidal ideation at present.. On Haldol, Atarax and trazodone.  Hypomagnesemia Replenished  Code Status : Full code  Family Communication  : None  Disposition Plan  : Home once improved  Barriers For Discharge :   Consults  : None  Procedures  : None  DVT Prophylaxis  : SCDs  Lab Results  Component Value Date   PLT 27 (LL) 01/14/2018    Antibiotics  :   Anti-infectives (From admission, onward)   None        Objective:   Vitals:   01/13/18 1930 01/13/18 2034 01/13/18 2353 01/14/18 0539  BP: (!) 141/92 (!) 133/96 (!) 157/96 (!) 142/96  Pulse: 81 79 (!) 102 87  Resp: '16 16  18  '$ Temp:  98.1 F (36.7 C)  98.2 F (36.8 C)  TempSrc:  Oral  Oral  SpO2: 96% 95%  93%  Weight:      Height:        Wt Readings from Last 3 Encounters:  01/13/18 57.6 kg (127 lb)  01/08/18 58 kg (127 lb 14.4 oz)  01/01/18 59 kg (130 lb)     Intake/Output Summary (Last 24 hours) at 01/14/2018 1013 Last  data filed at 01/14/2018 0800 Gross per 24 hour  Intake -  Output 800 ml  Net -800 ml     Physical Exam  Gen: not in distress , fatigued HEENT: no pallor, moist mucosa, supple neck Chest: clear b/l, no added sounds CVS: S1&S2 tachycardic , no murmurs GI: soft, NT, ND,  Musculoskeletal: warm, no edema CNS: alert and oriented, tremulous    Data Review:    CBC Recent Labs  Lab 01/08/18 0423 01/13/18 1208 01/14/18 0531  WBC 3.7* 3.6* 2.1*  HGB 10.1* 11.5* 10.1*  HCT 31.8* 35.6* 31.6*  PLT 21* 45* 27*  MCV 93.3 92.2 92.1  MCH 29.6 29.8 29.4  MCHC 31.8 32.3 32.0  RDW 20.2* 20.2* 19.7*  LYMPHSABS 0.7  --   --   MONOABS 0.8  --   --   EOSABS 0.1  --   --   BASOSABS 0.0  --   --     Chemistries  Recent Labs  Lab 01/08/18 0423 01/13/18 1208 01/14/18 0531  NA 136 143 136  K 4.2 3.7 3.9  CL 106 106 104  CO2 '22 24 23  '$ GLUCOSE 113* 112* 90  BUN 8 5* 6  CREATININE 0.51 0.52 0.56  CALCIUM 8.6* 8.1* 8.3*  MG 2.0  --  1.7  AST  --  65* 61*  ALT  --  24 23  ALKPHOS  --  160* 139*  BILITOT  --  1.1 1.1   ------------------------------------------------------------------------------------------------------------------ No results for input(s): CHOL, HDL, LDLCALC, TRIG, CHOLHDL, LDLDIRECT in the last 72 hours.  Lab Results  Component Value Date   HGBA1C 4.9 10/21/2017   ------------------------------------------------------------------------------------------------------------------ No results for input(s): TSH, T4TOTAL, T3FREE, THYROIDAB in the last 72 hours.  Invalid input(s): FREET3 ------------------------------------------------------------------------------------------------------------------ No results for input(s): VITAMINB12, FOLATE, FERRITIN, TIBC,  IRON, RETICCTPCT in the last 72 hours.  Coagulation profile Recent Labs  Lab 01/14/18 0531  INR 1.14    No results for input(s): DDIMER in the last 72 hours.  Cardiac Enzymes No results for input(s):  CKMB, TROPONINI, MYOGLOBIN in the last 168 hours.  Invalid input(s): CK ------------------------------------------------------------------------------------------------------------------ No results found for: BNP  Inpatient Medications  Scheduled Meds: . busPIRone  10 mg Oral BID  . chlordiazePOXIDE  10 mg Oral TID   Followed by  . [START ON 01/15/2018] chlordiazePOXIDE  10 mg Oral Daily  . diazepam  2.5 mg Oral Q8H  . famotidine  20 mg Oral BID  . FLUoxetine  20 mg Oral Daily  . folic acid  1 mg Oral Daily  . furosemide  20 mg Oral Daily  . gabapentin  200 mg Oral TID  . haloperidol  2 mg Oral QHS  . lactulose  20 g Oral Daily  . multivitamin with minerals  1 tablet Oral Daily  . spironolactone  50 mg Oral Daily  . thiamine  100 mg Oral Daily   Or  . thiamine  100 mg Intravenous Daily  . topiramate  50 mg Oral Daily   Continuous Infusions: . sodium chloride 100 mL/hr at 01/14/18 0912   PRN Meds:.acetaminophen **OR** acetaminophen, hydrOXYzine, LORazepam **OR** LORazepam, ondansetron **OR** ondansetron (ZOFRAN) IV, traZODone  Micro Results No results found for this or any previous visit (from the past 240 hour(s)).  Radiology Reports Ct Head Wo Contrast  Result Date: 01/13/2018 CLINICAL DATA:  Weakness and repeated falls EXAM: CT HEAD WITHOUT CONTRAST CT CERVICAL SPINE WITHOUT CONTRAST TECHNIQUE: Multidetector CT imaging of the head and cervical spine was performed following the standard protocol without intravenous contrast. Multiplanar CT image reconstructions of the cervical spine were also generated. COMPARISON:  01/03/2018 FINDINGS: CT HEAD FINDINGS Brain: No evidence of acute infarction, hemorrhage, hydrocephalus, extra-axial collection or mass lesion/mass effect. Vascular: No hyperdense vessel or unexpected calcification. Skull: Normal. Negative for fracture or focal lesion. Sinuses/Orbits: No acute finding. Other: None. CT CERVICAL SPINE FINDINGS Alignment: Mild loss of  the normal cervical lordosis is noted likely related to muscular spasm. Skull base and vertebrae: 7 cervical segments are well visualized. Vertebral body height is well maintained. Facet hypertrophic changes are noted bilaterally but worst on the left at C4-5. Osteophytic changes are noted at C4-5, C5-6 and C6-7. No acute fracture or acute facet abnormality is noted. The odontoid is within normal limits. Soft tissues and spinal canal: No focal soft tissue abnormality is noted. Upper chest: Within normal limits. Other: None IMPRESSION: CT of the head: No acute intracranial abnormality noted. CT of the cervical spine: Degenerative changes without acute abnormality. Electronically Signed   By: Inez Catalina M.D.   On: 01/13/2018 13:21   Ct Head Wo Contrast  Result Date: 01/03/2018 CLINICAL DATA:  Headache, acute, severe, worst of life. Head pain and nose bleeds for 2 months. EXAM: CT HEAD WITHOUT CONTRAST TECHNIQUE: Contiguous axial images were obtained from the base of the skull through the vertex without intravenous contrast. COMPARISON:  None. FINDINGS: Brain: No evidence of acute infarction, hemorrhage, hydrocephalus, extra-axial collection or mass lesion/mass effect. Two slices are affected by streak artifact from an earing. Questionable small calcification in the left cerebellum, which would be nonspecific in isolation and incidental to the history. Mild cerebral volume loss. Vascular: No hyperdense vessel or unexpected calcification. Skull: Normal. Negative for fracture or focal lesion. Sinuses/Orbits: No acute finding. IMPRESSION: Negative head CT. Electronically Signed  By: Monte Fantasia M.D.   On: 01/03/2018 18:20   Ct Cervical Spine Wo Contrast  Result Date: 01/13/2018 CLINICAL DATA:  Weakness and repeated falls EXAM: CT HEAD WITHOUT CONTRAST CT CERVICAL SPINE WITHOUT CONTRAST TECHNIQUE: Multidetector CT imaging of the head and cervical spine was performed following the standard protocol without  intravenous contrast. Multiplanar CT image reconstructions of the cervical spine were also generated. COMPARISON:  01/03/2018 FINDINGS: CT HEAD FINDINGS Brain: No evidence of acute infarction, hemorrhage, hydrocephalus, extra-axial collection or mass lesion/mass effect. Vascular: No hyperdense vessel or unexpected calcification. Skull: Normal. Negative for fracture or focal lesion. Sinuses/Orbits: No acute finding. Other: None. CT CERVICAL SPINE FINDINGS Alignment: Mild loss of the normal cervical lordosis is noted likely related to muscular spasm. Skull base and vertebrae: 7 cervical segments are well visualized. Vertebral body height is well maintained. Facet hypertrophic changes are noted bilaterally but worst on the left at C4-5. Osteophytic changes are noted at C4-5, C5-6 and C6-7. No acute fracture or acute facet abnormality is noted. The odontoid is within normal limits. Soft tissues and spinal canal: No focal soft tissue abnormality is noted. Upper chest: Within normal limits. Other: None IMPRESSION: CT of the head: No acute intracranial abnormality noted. CT of the cervical spine: Degenerative changes without acute abnormality. Electronically Signed   By: Inez Catalina M.D.   On: 01/13/2018 13:21   US Abdomen Complete  Result Date: 01/02/2018 CLINICAL DATA:  Initial evaluation for splenomegaly. History of hypertonicity with cirrhosis. EXAM: ABDOMEN ULTRASOUND COMPLETE COMPARISON:  Prior CT from 07/24/2017. FINDINGS: Gallbladder: 1.6 cm calculus present within the gallbladder lumen. Gallbladder wall measure within normal limits of 1.9 mm. No free pericholecystic fluid. No sonographic Murphy sign elicited on exam. Common bile duct: Diameter: 5.5 mm Liver: Liver demonstrates coarse heterogeneous and increased echotexture with nodular contour, consistent with cirrhosis. No focal intrahepatic lesion. Portal vein is patent on color Doppler imaging with normal direction of blood flow towards the liver. IVC: No  abnormality visualized. Pancreas: Visualized portion unremarkable. Spleen: Size and appearance within normal limits, measuring 9.8 cm in craniocaudad dimension. Right Kidney: Length: 11.4 cm. Echogenicity within normal limits. No mass or hydronephrosis visualized. Left Kidney: Length: 13.7 cm. Echogenicity within normal limits. No mass or hydronephrosis visualized. Abdominal aorta: No aneurysm visualized. Other findings: None. IMPRESSION: 1. Spleen size within normal limits by sonography. No evidence for splenomegaly. 2. Hepatic cirrhosis. 3. Cholelithiasis. No sonographic features to suggest acute cholecystitis. No biliary dilatation. Electronically Signed   By: Jeannine Boga M.D.   On: 01/02/2018 22:18   Ct Biopsy  Result Date: 01/06/2018 INDICATION: 47 year old with thrombocytopenia. EXAM: CT GUIDED BONE MARROW ASPIRATES AND BIOPSY Physician: Stephan Minister. Anselm Pancoast, MD MEDICATIONS: None. ANESTHESIA/SEDATION: Fentanyl 100 mcg IV; Versed 2.0 mg IV Moderate Sedation Time:  10 minutes The patient was continuously monitored during the procedure by the interventional radiology nurse under my direct supervision. COMPLICATIONS: None immediate. PROCEDURE: The procedure was explained to the patient. The risks and benefits of the procedure were discussed and the patient's questions were addressed. Informed consent was obtained from the patient. The patient was placed prone on CT table. Images of the pelvis were obtained. The right side of back was prepped and draped in sterile fashion. The skin and right posterior ilium were anesthetized with 1% lidocaine. 11 gauge bone needle was directed into the right ilium with CT guidance. Two aspirates and one core biopsy were obtained. Bandage placed over the puncture site. IMPRESSION: CT guided bone marrow aspiration  and core biopsy. Electronically Signed   By: Markus Daft M.D.   On: 01/06/2018 09:08   Ct Bone Marrow Biopsy & Aspiration  Result Date: 01/06/2018 INDICATION:  47 year old with thrombocytopenia. EXAM: CT GUIDED BONE MARROW ASPIRATES AND BIOPSY Physician: Stephan Minister. Anselm Pancoast, MD MEDICATIONS: None. ANESTHESIA/SEDATION: Fentanyl 100 mcg IV; Versed 2.0 mg IV Moderate Sedation Time:  10 minutes The patient was continuously monitored during the procedure by the interventional radiology nurse under my direct supervision. COMPLICATIONS: None immediate. PROCEDURE: The procedure was explained to the patient. The risks and benefits of the procedure were discussed and the patient's questions were addressed. Informed consent was obtained from the patient. The patient was placed prone on CT table. Images of the pelvis were obtained. The right side of back was prepped and draped in sterile fashion. The skin and right posterior ilium were anesthetized with 1% lidocaine. 11 gauge bone needle was directed into the right ilium with CT guidance. Two aspirates and one core biopsy were obtained. Bandage placed over the puncture site. IMPRESSION: CT guided bone marrow aspiration and core biopsy. Electronically Signed   By: Markus Daft M.D.   On: 01/06/2018 09:08    Time Spent in minutes  25   Chase Knebel M.D on 01/14/2018 at 10:13 AM  Between 7am to 7pm - Pager - [phone]  After 7pm go to [email or web address] - password [email or web address]  [facility] -  Office  [phone]

## 2018-01-14 NOTE — Progress Notes (Signed)
Initial Nutrition Assessment  DOCUMENTATION CODES:   (Will assess for malnutrition at follow-up)  INTERVENTION:  - Will order Boost Breeze BID, each supplement provides 250 kcal and 9 grams of protein. - Continue to encourage PO intakes.  - RD will continue to monitor for additional nutrition-related needs.    NUTRITION DIAGNOSIS:   Increased nutrient needs related to chronic illness(cirrhosis with ongoing alcohol abuse) as evidenced by estimated needs.   GOAL:   Patient will meet greater than or equal to 90% of their needs  MONITOR:   PO intake, Supplement acceptance, Weight trends, Labs  REASON FOR ASSESSMENT:   Malnutrition Screening Tool, Consult Assessment of nutrition requirement/status  ASSESSMENT:   47 year old female with active alcohol abuse, alcoholic liver cirrhosis with history of esophageal varices, pancytopenia, schizophrenia brought into the ED by police when found to be intoxicated.  In the ED she was found to be in alcohol withdrawal and placed on CIWA.  Patient could not be discharged home as she was unsteady, lethargic and tremulous.  BMI indicates overweight status. No intakes documented since admission. Pt reports eating a muffin for breakfast and denies abdominal discomfort or nausea with intake. She states not feeling hungry for awhile but unable to give a more specific time frame. States she has not been eating much for the past >2 weeks. Unable to obtain further information from pt at this time as she pretended to be asleep. No family/visitors present. Reviewed previous notes from RDs which indicate that pt likes Boost Breeze. Per chart review, weight has been stable for the past 4 months.  Medications reviewed; 20 mg oral Pepcid BID, 1 mg oral folic acid/day, 20 mg oral Lasix/day, 20 g lactulose/day, daily multivitamin with minerals, 50 mg Aldactone/day, 100 mg oral thiamine/day.  Labs reviewed; Ca: 8.3 mg/dL, AST elevated but trending down.   IVF: NS  @ 100 mL/hr.     NUTRITION - FOCUSED PHYSICAL EXAM:  Unable to complete at this time and will attempt at follow-up.   Diet Order:   Diet Order           Diet 2 gram sodium Room service appropriate? Yes; Fluid consistency: Thin  Diet effective now          EDUCATION NEEDS:   Not appropriate for education at this time  Skin:  Skin Assessment: Reviewed RN Assessment  Last BM:  5/7  Height:   Ht Readings from Last 1 Encounters:  01/13/18 4\' 11"  (1.499 m)    Weight:   Wt Readings from Last 1 Encounters:  01/13/18 127 lb (57.6 kg)    Ideal Body Weight:  42.91 kg  BMI:  Body mass index is 25.65 kg/m.  Estimated Nutritional Needs:   Kcal:  1730-1900 (30-33 kcal/kg)  Protein:  75-86 grams (1.3-1.5 grams/kg)  Fluid:  >/= 1.5 L/day      Jarome Matin, MS, RD, LDN, [facility] Inpatient Clinical Dietitian Pager # [phone] After hours/weekend pager # [phone]

## 2018-01-14 NOTE — Care Management Note (Signed)
Case Management Note  Patient Details  Name: Briana Sweeney MRN: [id number] Date of Birth: 1970/10/06  Subjective/Objective:  47 y/o f admitted w/etoh w/drawal. From home. 7 adm/4 ed visits within 6 months. PCP-Kimberly Harris. Patient states she is trying to get disability-she is working w/[employer] & has appt for the blue card-access to meds.Financial counselor-potential for [employer]. Patient states she does not have any meds @ home-I have contacted the [facility] pharmacy-they will need new scripts for meds-Until patient gets the blue card they will not be able to fill meds for free-will use the [facility] for meds(patient states she can afford the $3 co pay for each med).                 Action/Plan:d/c plan home.   Expected Discharge Date:  (unknown)               Expected Discharge Plan:  Home/Self Care  In-House Referral:     Discharge planning Services  CM Consult, [facility]  Post Acute Care Choice:    Choice offered to:     DME Arranged:    DME Agency:     HH Arranged:    HH Agency:     Status of Service:  In process, will continue to follow  If discussed at Long Length of Stay Meetings, dates discussed:    Additional Comments:  Dessa Phi, RN 01/14/2018, 2:57 PM

## 2018-01-14 NOTE — Progress Notes (Signed)
CRITICAL VALUE ALERT  Critical Value:  Platelet-27  Date & Time Notied:  01/14/18 @ 7618  Provider Notified: Dr. Maudie Mercury  Orders Received/Actions taken: [place] lab

## 2018-01-14 NOTE — Progress Notes (Signed)
PT Cancellation Note  Patient Details Name: Briana Sweeney MRN: [id number] DOB: 26-Jan-1971   Cancelled Treatment:    Reason Eval/Treat Not Completed: Fatigue/lethargy limiting ability to participate, patient  Lethargic, Per RN, slowly arousing. Will defer Evaluation  until tomorrow when patient may be more awake.    Claretha Cooper 01/14/2018, 2:57 PM Tresa Endo PT [phone]

## 2018-01-15 DIAGNOSIS — F209 Schizophrenia, unspecified: Secondary | ICD-10-CM

## 2018-01-15 DIAGNOSIS — K7031 Alcoholic cirrhosis of liver with ascites: Secondary | ICD-10-CM

## 2018-01-15 LAB — BASIC METABOLIC PANEL
Anion gap: 10 (ref 5–15)
BUN: 5 mg/dL — ABNORMAL LOW (ref 6–20)
CO2: 22 mmol/L (ref 22–32)
Calcium: 8.6 mg/dL — ABNORMAL LOW (ref 8.9–10.3)
Chloride: 104 mmol/L (ref 101–111)
Creatinine, Ser: 0.54 mg/dL (ref 0.44–1.00)
GFR calc Af Amer: >60 mL/min (ref >60)
GFR calc non Af Amer: >60 mL/min (ref >60)
Glucose, Bld: 93 mg/dL (ref 65–99)
Potassium: 3.6 mmol/L (ref 3.5–5.1)
Sodium: 136 mmol/L (ref 135–145)

## 2018-01-15 LAB — CBC
HCT: 32.1 % — ABNORMAL LOW (ref 36.0–46.0)
Hemoglobin: 10.2 g/dL — ABNORMAL LOW (ref 12.0–15.0)
MCH: 29.4 pg (ref 26.0–34.0)
MCHC: 31.8 g/dL (ref 30.0–36.0)
MCV: 92.5 fL (ref 78.0–100.0)
Platelets: 22 10*3/uL — CL (ref 150–400)
RBC: 3.47 MIL/uL — ABNORMAL LOW (ref 3.87–5.11)
RDW: 18.9 % — ABNORMAL HIGH (ref 11.5–15.5)
WBC: 2 10*3/uL — ABNORMAL LOW (ref 4.0–10.5)

## 2018-01-15 LAB — CHROMOSOME ANALYSIS, BONE MARROW

## 2018-01-15 MED ORDER — TRAMADOL HCL 50 MG PO TABS
50.0000 mg | ORAL_TABLET | Freq: Four times a day (QID) | ORAL | Status: DC | PRN
  Administered 2018-01-15 – 2018-01-17 (×8): 50 mg via ORAL
  Filled 2018-01-15 (×8): qty 1

## 2018-01-15 NOTE — Evaluation (Signed)
Physical Therapy Evaluation-1x Patient Details Name: Briana Sweeney MRN: [id number] DOB: [birth year] Today's Date: 01/15/2018   History of Present Illness  47 yo female admitted with ETOH withdrawal syndrome, weakness, falls. Hx of ETOH abuse, bipolar, liver cirrhosis, schizophrenia, sz, SI. Recent d/c 5/2.  Clinical Impression  On eval, pt was Mod Ind with mobility. She walked ~250 feet. No LOB. No acute PT needs. 1x eval. Will sign off.     Follow Up Recommendations No PT follow up    Equipment Recommendations  None recommended by PT    Recommendations for Other Services       Precautions / Restrictions Restrictions Weight Bearing Restrictions: No      Mobility  Bed Mobility Overal bed mobility: Independent                Transfers Overall transfer level: Modified independent                  Ambulation/Gait Ambulation/Gait assistance: Modified independent (Device/Increase time) Ambulation Distance (Feet): 250 Feet Assistive device: None Gait Pattern/deviations: WFL(Within Functional Limits)     General Gait Details: slow gait speed. mildly unsteady at times but no LOB  Financial trader Rankin (Stroke Patients Only)       Balance Overall balance assessment: Mild deficits observed, not formally tested                                           Pertinent Vitals/Pain Pain Assessment: No/denies pain    Home Living Family/patient expects to be discharged to:: Private residence Living Arrangements: Other relatives   Type of Home: House Home Access: Stairs to enter   Technical brewer of Steps: 3 Home Layout: One level        Prior Function Level of Independence: Independent               Hand Dominance        Extremity/Trunk Assessment   Upper Extremity Assessment Upper Extremity Assessment: Overall WFL for tasks assessed    Lower Extremity Assessment Lower  Extremity Assessment: Overall WFL for tasks assessed    Cervical / Trunk Assessment Cervical / Trunk Assessment: Normal  Communication   Communication: No difficulties  Cognition Arousal/Alertness: Awake/alert Behavior During Therapy: WFL for tasks assessed/performed Overall Cognitive Status: Within Functional Limits for tasks assessed                                        General Comments      Exercises     Assessment/Plan    PT Assessment Patent does not need any further PT services  PT Problem List         PT Treatment Interventions      PT Goals (Current goals can be found in the Care Plan section)  Acute Rehab PT Goals PT Goal Formulation: All assessment and education complete, DC therapy    Frequency     Barriers to discharge        Co-evaluation               AM-PAC PT "6 Clicks" Daily Activity  Outcome Measure Difficulty turning over in bed (including adjusting bedclothes, sheets and blankets)?: None Difficulty  moving from lying on back to sitting on the side of the bed? : None Difficulty sitting down on and standing up from a chair with arms (e.g., wheelchair, bedside commode, etc,.)?: None Help needed moving to and from a bed to chair (including a wheelchair)?: None Help needed walking in hospital room?: None Help needed climbing 3-5 steps with a railing? : None 6 Click Score: 24    End of Session   Activity Tolerance: Patient tolerated treatment well Patient left: in bed;with call bell/phone within reach;with bed alarm set;with nursing/sitter in room        Time: 1140-1156 PT Time Calculation (min) (ACUTE ONLY): 16 min   Charges:   PT Evaluation $PT Eval Moderate Complexity: 1 Mod     PT G Codes:          Weston Anna, MPT Pager: [phone]

## 2018-01-15 NOTE — Progress Notes (Signed)
PROGRESS NOTE    Briana Sweeney  ELF:[id number] DOB: 07-18-1971 DOA: 01/13/2018 PCP: Scot Jun, FNP    Brief Narrative: 47 year old female with active alcohol abuse, alcoholic liver cirrhosis with history of esophageal varices, pancytopenia, schizophrenia brought into the ED by police when found to be intoxicated.  In the ED she was found to be in alcohol withdrawal and placed on CIWA.  Patient could not be discharged home as she was unsteady, lethargic and tremulous. Patient reported having seizures 2 days prior to admission and a fall at home.  She then started drinking more alcohol (mainly liquor and beer). She was just discharged 5-day prior to admission after being admitted for a week with alcohol withdrawal. Patient was mildly tachycardic and hypertensive in the ED.  Blood work showed pancytopenia with platelets of 45 (had improved from recent hospitalization).  Blood alcohol level was 363. Patient admitted for acute alcohol withdrawal symptoms.    Assessment & Plan:   Principal Problem:   Alcohol withdrawal syndrome with complication (HCC) Active Problems:   Hepatitis C antibody positive in blood   Pancytopenia (HCC)   Alcoholic cirrhosis of liver with ascites (HCC)   Schizophrenia (HCC)   MDD (major depressive disorder), severe (HCC)   Thrombocytopenia (HCC)  Alcohol abuse with signs of withdrawal: Watch her on CIWA, continues to require IV ativan.  Some tremors in the right hand.  But much improved from yesterday.    Alcoholic cirrhosis with h/o esophageal varices: Resume lactulose, lasix and spironolactone.  No signs of bleeding.  No signs of ascites.    Pancytopenia: Secondary to liver cirrhosis and alcohol use.    Hypopglycemia: Resolved.    Depression and schizophrenia: Recently diag with suicidal ideation, pt denies any SI today.  Resume home meds.   Hypomagnesemia Replaced.      DVT prophylaxis: scd's Code Status: full code.  Family  Communication: none at bedside.  Disposition Plan: possibly discharge in am.    Consultants:   None.     Procedures: none.   Antimicrobials: none.   Subjective: Slightly nauseated. No chest pain or sob. No abdominal pain.  Tremors in the hands.   Objective: Vitals:   01/14/18 1333 01/15/18 0001 01/15/18 0532 01/15/18 1410  BP: (!) 145/98 (!) 131/91 (!) 129/98 (!) 127/98  Pulse: 82 95 90 89  Resp: 20 20 20 20   Temp: 98.1 F (36.7 C) 98.6 F (37 C) 98.2 F (36.8 C) 97.8 F (36.6 C)  TempSrc: Oral Oral Oral Oral  SpO2: 99% 96% 97% 96%  Weight:      Height:        Intake/Output Summary (Last 24 hours) at 01/15/2018 1615 Last data filed at 01/15/2018 1300 Gross per 24 hour  Intake 3676.66 ml  Output 3150 ml  Net 526.66 ml   Filed Weights   01/13/18 1117  Weight: 57.6 kg (127 lb)    Examination:  General exam: Appears anxious, with tremors in the right hand.  Respiratory system: Clear to auscultation. Respiratory effort normal. Cardiovascular system: S1 & S2 heard, RRR. No JVD, murmurs, rubs, gallops or clicks. No pedal edema. Gastrointestinal system: Abdomen is nondistended, soft and nontender. No organomegaly or masses felt. Normal bowel sounds heard. Central nervous system: Alert and oriented. No focal neurological deficits. Extremities: Symmetric 5 x 5 power. Skin: No rashes, lesions or ulcers Psychiatry:  Mood & affect appropriate.     Data Reviewed: I have personally reviewed following labs and imaging studies  CBC: Recent Labs  Lab 01/13/18 1208 01/14/18 0531 01/14/18 1116 01/15/18 0509  WBC 3.6* 2.1* 2.2* 2.0*  HGB 11.5* 10.1* 10.5* 10.2*  HCT 35.6* 31.6* 32.7* 32.1*  MCV 92.2 92.1 92.6 92.5  PLT 45* 27* 23* 22*   Basic Metabolic Panel: Recent Labs  Lab 01/13/18 1208 01/14/18 0531 01/15/18 0509  NA 143 136 136  K 3.7 3.9 3.6  CL 106 104 104  CO2 24 23 22   GLUCOSE 112* 90 93  BUN 5* 6 5*  CREATININE 0.52 0.56 0.54  CALCIUM 8.1* 8.3*  8.6*  MG  --  1.7  --    GFR: Estimated Creatinine Clearance: 68.7 mL/min (by C-G formula based on SCr of 0.54 mg/dL). Liver Function Tests: Recent Labs  Lab 01/13/18 1208 01/14/18 0531  AST 65* 61*  ALT 24 23  ALKPHOS 160* 139*  BILITOT 1.1 1.1  PROT 8.3* 7.3  ALBUMIN 3.4* 2.9*   No results for input(s): LIPASE, AMYLASE in the last 168 hours. Recent Labs  Lab 01/13/18 1208  AMMONIA 32   Coagulation Profile: Recent Labs  Lab 01/14/18 0531  INR 1.14   Cardiac Enzymes: No results for input(s): CKTOTAL, CKMB, CKMBINDEX, TROPONINI in the last 168 hours. BNP (last 3 results) No results for input(s): PROBNP in the last 8760 hours. HbA1C: No results for input(s): HGBA1C in the last 72 hours. CBG: Recent Labs  Lab 01/13/18 1154 01/13/18 1156 01/13/18 1600  GLUCAP 20* 110* 104*   Lipid Profile: No results for input(s): CHOL, HDL, LDLCALC, TRIG, CHOLHDL, LDLDIRECT in the last 72 hours. Thyroid Function Tests: No results for input(s): TSH, T4TOTAL, FREET4, T3FREE, THYROIDAB in the last 72 hours. Anemia Panel: No results for input(s): VITAMINB12, FOLATE, FERRITIN, TIBC, IRON, RETICCTPCT in the last 72 hours. Sepsis Labs: No results for input(s): PROCALCITON, LATICACIDVEN in the last 168 hours.  No results found for this or any previous visit (from the past 240 hour(s)).       Radiology Studies: No results found.      Scheduled Meds: . busPIRone  10 mg Oral BID  . chlordiazePOXIDE  10 mg Oral Daily  . diazepam  2.5 mg Oral Q8H  . famotidine  20 mg Oral BID  . feeding supplement  1 Container Oral BID BM  . FLUoxetine  20 mg Oral Daily  . folic acid  1 mg Oral Daily  . furosemide  20 mg Oral Daily  . gabapentin  200 mg Oral TID  . haloperidol  2 mg Oral QHS  . lactulose  20 g Oral Daily  . multivitamin with minerals  1 tablet Oral Daily  . spironolactone  50 mg Oral Daily  . thiamine  100 mg Oral Daily   Or  . thiamine  100 mg Intravenous Daily  .  topiramate  50 mg Oral Daily   Continuous Infusions:   LOS: 2 days    Time spent: 34 mnutes.     Hosie Poisson, MD [facility] Pager [phone]  If 7PM-7AM, please contact night-coverage [email or web address] Password [email or web address] 01/15/2018, 4:15 PM

## 2018-01-16 LAB — CBC WITH DIFFERENTIAL/PLATELET
Basophils Absolute: 0 10*3/uL (ref 0.0–0.1)
Basophils Relative: 1 %
Eosinophils Absolute: 0.2 10*3/uL (ref 0.0–0.7)
Eosinophils Relative: 8 %
HCT: 33.9 % — ABNORMAL LOW (ref 36.0–46.0)
Hemoglobin: 10.9 g/dL — ABNORMAL LOW (ref 12.0–15.0)
Lymphocytes Relative: 36 %
Lymphs Abs: 1 10*3/uL (ref 0.7–4.0)
MCH: 29.6 pg (ref 26.0–34.0)
MCHC: 32.2 g/dL (ref 30.0–36.0)
MCV: 92.1 fL (ref 78.0–100.0)
Monocytes Absolute: 0.3 10*3/uL (ref 0.1–1.0)
Monocytes Relative: 11 %
Neutro Abs: 1.3 10*3/uL — ABNORMAL LOW (ref 1.7–7.7)
Neutrophils Relative %: 44 %
Platelets: 33 10*3/uL — ABNORMAL LOW (ref 150–400)
RBC: 3.68 MIL/uL — ABNORMAL LOW (ref 3.87–5.11)
RDW: 18.6 % — ABNORMAL HIGH (ref 11.5–15.5)
WBC: 2.9 10*3/uL — ABNORMAL LOW (ref 4.0–10.5)

## 2018-01-16 NOTE — Progress Notes (Signed)
PROGRESS NOTE    Briana Sweeney  ZDG:[id number] DOB: [birth year] DOA: 01/13/2018 PCP: Scot Jun, FNP    Brief Narrative: 47 year old female with active alcohol abuse, alcoholic liver cirrhosis with history of esophageal varices, pancytopenia, schizophrenia brought into the ED by police when found to be intoxicated.  In the ED she was found to be in alcohol withdrawal and placed on CIWA.  Patient could not be discharged home as she was unsteady, lethargic and tremulous. Patient reported having seizures 2 days prior to admission and a fall at home.  She then started drinking more alcohol (mainly liquor and beer). She was just discharged 5-day prior to admission after being admitted for a week with alcohol withdrawal. Patient was mildly tachycardic and hypertensive in the ED.  Blood work showed pancytopenia with platelets of 45 (had improved from recent hospitalization).  Blood alcohol level was 363. Patient admitted for acute alcohol withdrawal symptoms.    Assessment & Plan:   Principal Problem:   Alcohol withdrawal syndrome with complication (HCC) Active Problems:   Hepatitis C antibody positive in blood   Pancytopenia (HCC)   Alcoholic cirrhosis of liver with ascites (HCC)   Schizophrenia (HCC)   MDD (major depressive disorder), severe (HCC)   Thrombocytopenia (HCC)  Alcohol abuse with signs of withdrawal: Watch her on CIWA, continues to require IV ativan. Her withdrawals have improved.  But she is still not back to baseline.  She reports generlaized body aches.    Alcoholic cirrhosis with h/o esophageal varices: Resume lactulose, lasix and spironolactone.  No signs of bleeding.  No signs of ascites.    Pancytopenia: Secondary to liver cirrhosis and alcohol use. Improving counts.  No signs of bleeding.    Hypoglycemia: Resolved.    Depression and schizophrenia: Recently diag with suicidal ideation, pt denies any SI today.  Resume home meds.    Hypomagnesemia Replaced.       DVT prophylaxis: scd's Code Status: full code.  Family Communication: none at bedside.  Disposition Plan: d/c home tomorrow.    Consultants:   None.     Procedures: none.   Antimicrobials: none.   Subjective: Nausea, better, no chest pain or sob. Still having tremors.  Generalized body aches.   Objective: Vitals:   01/15/18 2056 01/16/18 0504 01/16/18 1026 01/16/18 1413  BP: 118/84 (!) 129/96 109/84 106/81  Pulse: 86 98 95 84  Resp: 18 16 16 16   Temp: 97.9 F (36.6 C) 98 F (36.7 C) 97.8 F (36.6 C) 98.2 F (36.8 C)  TempSrc: Oral Oral Oral Oral  SpO2: 98% 97% 96% 96%  Weight:      Height:        Intake/Output Summary (Last 24 hours) at 01/16/2018 1719 Last data filed at 01/16/2018 1420 Gross per 24 hour  Intake 1200 ml  Output 1300 ml  Net -100 ml   Filed Weights   01/13/18 1117  Weight: 57.6 kg (127 lb)    Examination: no change in exam compared to yesterday.   General exam: Appears anxious, with tremors in the right hand.  Respiratory system: Clear to auscultation. Respiratory effort normal. Cardiovascular system: S1 & S2 heard, RRR. No JVD, . No pedal edema. Gastrointestinal system: Abdomen is nondistended, soft and nontender. No organomegaly or masses felt. Normal bowel sounds heard. Central nervous system: Alert and oriented. No focal neurological deficits. Extremities: Symmetric 5 x 5 power. Skin: No rashes, lesions or ulcers Psychiatry:  Mood & affect appropriate.     Data Reviewed: I  have personally reviewed following labs and imaging studies  CBC: Recent Labs  Lab 01/13/18 1208 01/14/18 0531 01/14/18 1116 01/15/18 0509 01/16/18 0942  WBC 3.6* 2.1* 2.2* 2.0* 2.9*  NEUTROABS  --   --   --   --  1.3*  HGB 11.5* 10.1* 10.5* 10.2* 10.9*  HCT 35.6* 31.6* 32.7* 32.1* 33.9*  MCV 92.2 92.1 92.6 92.5 92.1  PLT 45* 27* 23* 22* 33*   Basic Metabolic Panel: Recent Labs  Lab 01/13/18 1208  01/14/18 0531 01/15/18 0509  NA 143 136 136  K 3.7 3.9 3.6  CL 106 104 104  CO2 24 23 22   GLUCOSE 112* 90 93  BUN 5* 6 5*  CREATININE 0.52 0.56 0.54  CALCIUM 8.1* 8.3* 8.6*  MG  --  1.7  --    GFR: Estimated Creatinine Clearance: 68.7 mL/min (by C-G formula based on SCr of 0.54 mg/dL). Liver Function Tests: Recent Labs  Lab 01/13/18 1208 01/14/18 0531  AST 65* 61*  ALT 24 23  ALKPHOS 160* 139*  BILITOT 1.1 1.1  PROT 8.3* 7.3  ALBUMIN 3.4* 2.9*   No results for input(s): LIPASE, AMYLASE in the last 168 hours. Recent Labs  Lab 01/13/18 1208  AMMONIA 32   Coagulation Profile: Recent Labs  Lab 01/14/18 0531  INR 1.14   Cardiac Enzymes: No results for input(s): CKTOTAL, CKMB, CKMBINDEX, TROPONINI in the last 168 hours. BNP (last 3 results) No results for input(s): PROBNP in the last 8760 hours. HbA1C: No results for input(s): HGBA1C in the last 72 hours. CBG: Recent Labs  Lab 01/13/18 1154 01/13/18 1156 01/13/18 1600  GLUCAP 20* 110* 104*   Lipid Profile: No results for input(s): CHOL, HDL, LDLCALC, TRIG, CHOLHDL, LDLDIRECT in the last 72 hours. Thyroid Function Tests: No results for input(s): TSH, T4TOTAL, FREET4, T3FREE, THYROIDAB in the last 72 hours. Anemia Panel: No results for input(s): VITAMINB12, FOLATE, FERRITIN, TIBC, IRON, RETICCTPCT in the last 72 hours. Sepsis Labs: No results for input(s): PROCALCITON, LATICACIDVEN in the last 168 hours.  No results found for this or any previous visit (from the past 240 hour(s)).       Radiology Studies: No results found.      Scheduled Meds: . busPIRone  10 mg Oral BID  . chlordiazePOXIDE  10 mg Oral Daily  . diazepam  2.5 mg Oral Q8H  . famotidine  20 mg Oral BID  . feeding supplement  1 Container Oral BID BM  . FLUoxetine  20 mg Oral Daily  . folic acid  1 mg Oral Daily  . furosemide  20 mg Oral Daily  . gabapentin  200 mg Oral TID  . haloperidol  2 mg Oral QHS  . lactulose  20 g Oral  Daily  . multivitamin with minerals  1 tablet Oral Daily  . spironolactone  50 mg Oral Daily  . thiamine  100 mg Oral Daily   Or  . thiamine  100 mg Intravenous Daily  . topiramate  50 mg Oral Daily   Continuous Infusions:   LOS: 3 days    Time spent: 34 mnutes.     Hosie Poisson, MD [facility] Pager [phone]  If 7PM-7AM, please contact night-coverage [email or web address] Password [email or web address] 01/16/2018, 5:19 PM

## 2018-01-17 LAB — CBC WITH DIFFERENTIAL/PLATELET
Basophils Absolute: 0 10*3/uL (ref 0.0–0.1)
Basophils Relative: 1 %
Eosinophils Absolute: 0.3 10*3/uL (ref 0.0–0.7)
Eosinophils Relative: 7 %
HCT: 37.2 % (ref 36.0–46.0)
Hemoglobin: 12 g/dL (ref 12.0–15.0)
Lymphocytes Relative: 32 %
Lymphs Abs: 1.1 10*3/uL (ref 0.7–4.0)
MCH: 29.8 pg (ref 26.0–34.0)
MCHC: 32.3 g/dL (ref 30.0–36.0)
MCV: 92.3 fL (ref 78.0–100.0)
Monocytes Absolute: 0.5 10*3/uL (ref 0.1–1.0)
Monocytes Relative: 14 %
Neutro Abs: 1.7 10*3/uL (ref 1.7–7.7)
Neutrophils Relative %: 46 %
Platelets: 42 10*3/uL — ABNORMAL LOW (ref 150–400)
RBC: 4.03 MIL/uL (ref 3.87–5.11)
RDW: 18.8 % — ABNORMAL HIGH (ref 11.5–15.5)
WBC: 3.6 10*3/uL — ABNORMAL LOW (ref 4.0–10.5)

## 2018-01-17 MED ORDER — TRAMADOL HCL 50 MG PO TABS
50.0000 mg | ORAL_TABLET | Freq: Four times a day (QID) | ORAL | 0 refills | Status: AC | PRN
Start: 2018-01-17 — End: 2018-01-20

## 2018-01-17 MED ORDER — DIAZEPAM 5 MG PO TABS: 2.5000 mg | ORAL_TABLET | Freq: Two times a day (BID) | ORAL | 0 refills | Status: AC | PRN

## 2018-01-17 MED ORDER — IBUPROFEN 200 MG PO TABS: 400.0000 mg | ORAL_TABLET | Freq: Once | ORAL | Status: DC

## 2018-01-17 MED ORDER — CHLORDIAZEPOXIDE HCL 10 MG PO CAPS: 10.0000 mg | ORAL_CAPSULE | Freq: Every day | ORAL | 0 refills | Status: AC

## 2018-01-19 MED FILL — TOPIRAMATE 50 MG TABLET: 50 | 30 days supply | Qty: 30 | Fill #0

## 2018-01-19 MED FILL — traZODone HCL 50 MG TABS: 50 | 15 days supply | Qty: 15 | Fill #0

## 2018-01-19 MED FILL — busPIRone HCL 10 MG TABS: 10 | 15 days supply | Qty: 30 | Fill #0

## 2018-01-19 MED FILL — FLUoxetine HCL 20 MG CAPS: 20 | 30 days supply | Qty: 30 | Fill #0

## 2018-01-19 MED FILL — traMADol HCL 50 MG TABS: 50 | 3 days supply | Qty: 15 | Fill #0

## 2018-01-21 NOTE — Discharge Summary (Signed)
Physician Discharge Summary  Briana Sweeney JSE:[id number] DOB: 15-Jan-1971 DOA: 01/13/2018  PCP: Scot Jun, FNP  Admit date: 01/13/2018 Discharge date: 01/17/2018  Admitted From: Home.  Disposition:  Home.   Recommendations for Outpatient Follow-up:  1. Follow up with PCP in 1-2 weeks 2. Please obtain BMP/CBC in one week   Discharge Condition:stable.  CODE STATUS: full code.  Diet recommendation: Heart Healthy   Brief/Interim Summary: 47 year old female with active alcohol abuse, alcoholic liver cirrhosis with history of esophageal varices, pancytopenia, schizophrenia brought into the ED by police when found to be intoxicated. In the ED she was found to be in alcohol withdrawal and placed on CIWA. Patient could not be discharged home as she was unsteady, lethargic and tremulous. Patient reported having seizures 2 days prior to admission and a fall at home. She then started drinking more alcohol (mainly liquor and beer). She was just discharged 5-day prior to admission after being admitted for a week with alcohol withdrawal. Patient was mildly tachycardic and hypertensive in the ED. Blood work showed pancytopenia with platelets of 45 (had improved from recent hospitalization). Blood alcohol level was 363. Patient admitted for acute alcohol withdrawal symptoms.    Discharge Diagnoses:  Principal Problem:   Alcohol withdrawal syndrome with complication (HCC) Active Problems:   Hepatitis C antibody positive in blood   Pancytopenia (HCC)   Alcoholic cirrhosis of liver with ascites (HCC)   Schizophrenia (HCC)   MDD (major depressive disorder), severe (HCC)   Thrombocytopenia (HCC)  Alcohol abuse with signs of withdrawal: Started her on CIWA, and no signs of alcohol withdrawal She was discharged on librium to complete the course.    Alcoholic cirrhosis with h/o esophageal varices: Resume lactulose, lasix and spironolactone.  No signs of bleeding.  No signs of ascites.     Pancytopenia: Secondary to liver cirrhosis and alcohol use. Improving counts.  No signs of bleeding.    Hypoglycemia: Resolved.    Depression and schizophrenia: Recently diag with suicidal ideation, pt denies any SI today.  Resume home meds.   Hypomagnesemia Replaced.       Discharge Instructions  Discharge Instructions    Diet - low sodium heart healthy   Complete by:  As directed    Discharge instructions   Complete by:  As directed    Please follow up with PCP in one week.     Allergies as of 01/17/2018   No Known Allergies     Medication List    TAKE these medications   busPIRone 10 MG tablet Commonly known as:  BUSPAR Take 1 tablet (10 mg total) by mouth 2 (two) times daily.   chlordiazePOXIDE 10 MG capsule Commonly known as:  LIBRIUM Take 1 capsule (10 mg total) by mouth daily for 3 days.   famotidine 20 MG tablet Commonly known as:  PEPCID Take 1 tablet (20 mg total) by mouth 2 (two) times daily.   FLUoxetine 20 MG capsule Commonly known as:  PROZAC Take 1 capsule (20 mg total) by mouth daily. For mood control   folic acid 1 MG tablet Commonly known as:  FOLVITE Take 1 tablet (1 mg total) by mouth daily.   furosemide 20 MG tablet Commonly known as:  LASIX Take 1 tablet (20 mg total) by mouth daily.   gabapentin 100 MG capsule Commonly known as:  NEURONTIN Take 2 capsules (200 mg total) by mouth 3 (three) times daily.   haloperidol 2 MG tablet Commonly known as:  HALDOL Take 1 tablet (  2 mg total) by mouth at bedtime.   hydrOXYzine 25 MG tablet Commonly known as:  ATARAX/VISTARIL Take 1 tablet (25 mg total) by mouth 3 (three) times daily as needed for anxiety.   lactulose 10 GM/15ML solution Commonly known as:  CHRONULAC Take 30 mLs (20 g total) by mouth daily.   multivitamin with minerals Tabs tablet Take 1 tablet by mouth daily.   spironolactone 50 MG tablet Commonly known as:  ALDACTONE Take 1 tablet (50 mg total)  by mouth daily.   thiamine 100 MG tablet Take 1 tablet (100 mg total) by mouth daily.   topiramate 50 MG tablet Commonly known as:  TOPAMAX Take 1 tablet (50 mg total) by mouth daily.   traMADol 50 MG tablet Commonly known as:  ULTRAM Take 1 tablet (50 mg total) by mouth every 6 (six) hours as needed for moderate pain.   traZODone 50 MG tablet Commonly known as:  DESYREL Take 1 tablet (50 mg total) by mouth at bedtime as needed for sleep.     ASK your doctor about these medications   diazepam 5 MG tablet Commonly known as:  VALIUM Take 0.5 tablets (2.5 mg total) by mouth every 12 (twelve) hours as needed for up to 3 days for anxiety. Ask about: Should I take this medication?   traMADol 50 MG tablet Commonly known as:  ULTRAM Take 1 tablet (50 mg total) by mouth every 6 (six) hours as needed for up to 3 days for moderate pain. Ask about: Should I take this medication?      Follow-up Information    Scot Jun, FNP Follow up.   Specialty:  Family Medicine Contact information: [place] [address] [phone]        Scot Jun, FNP. Schedule an appointment as soon as possible for a visit in 1 week.   Specialty:  Family Medicine Contact information: [place] [address] [phone]          No Known Allergies  Consultations:  None.    Procedures/Studies: Ct Head Wo Contrast  Result Date: 01/13/2018 CLINICAL DATA:  Weakness and repeated falls EXAM: CT HEAD WITHOUT CONTRAST CT CERVICAL SPINE WITHOUT CONTRAST TECHNIQUE: Multidetector CT imaging of the head and cervical spine was performed following the standard protocol without intravenous contrast. Multiplanar CT image reconstructions of the cervical spine were also generated. COMPARISON:  01/03/2018 FINDINGS: CT HEAD FINDINGS Brain: No evidence of acute infarction, hemorrhage, hydrocephalus, extra-axial collection or mass lesion/mass effect. Vascular: No hyperdense  vessel or unexpected calcification. Skull: Normal. Negative for fracture or focal lesion. Sinuses/Orbits: No acute finding. Other: None. CT CERVICAL SPINE FINDINGS Alignment: Mild loss of the normal cervical lordosis is noted likely related to muscular spasm. Skull base and vertebrae: 7 cervical segments are well visualized. Vertebral body height is well maintained. Facet hypertrophic changes are noted bilaterally but worst on the left at C4-5. Osteophytic changes are noted at C4-5, C5-6 and C6-7. No acute fracture or acute facet abnormality is noted. The odontoid is within normal limits. Soft tissues and spinal canal: No focal soft tissue abnormality is noted. Upper chest: Within normal limits. Other: None IMPRESSION: CT of the head: No acute intracranial abnormality noted. CT of the cervical spine: Degenerative changes without acute abnormality. Electronically Signed   By: Inez Catalina M.D.   On: 01/13/2018 13:21   Ct Head Wo Contrast  Result Date: 01/03/2018 CLINICAL DATA:  Headache, acute, severe, worst of life. Head  pain and nose bleeds for 2 months. EXAM: CT HEAD WITHOUT CONTRAST TECHNIQUE: Contiguous axial images were obtained from the base of the skull through the vertex without intravenous contrast. COMPARISON:  None. FINDINGS: Brain: No evidence of acute infarction, hemorrhage, hydrocephalus, extra-axial collection or mass lesion/mass effect. Two slices are affected by streak artifact from an earing. Questionable small calcification in the left cerebellum, which would be nonspecific in isolation and incidental to the history. Mild cerebral volume loss. Vascular: No hyperdense vessel or unexpected calcification. Skull: Normal. Negative for fracture or focal lesion. Sinuses/Orbits: No acute finding. IMPRESSION: Negative head CT. Electronically Signed   By: Monte Fantasia M.D.   On: 01/03/2018 18:20   Ct Cervical Spine Wo Contrast  Result Date: 01/13/2018 CLINICAL DATA:  Weakness and repeated falls  EXAM: CT HEAD WITHOUT CONTRAST CT CERVICAL SPINE WITHOUT CONTRAST TECHNIQUE: Multidetector CT imaging of the head and cervical spine was performed following the standard protocol without intravenous contrast. Multiplanar CT image reconstructions of the cervical spine were also generated. COMPARISON:  01/03/2018 FINDINGS: CT HEAD FINDINGS Brain: No evidence of acute infarction, hemorrhage, hydrocephalus, extra-axial collection or mass lesion/mass effect. Vascular: No hyperdense vessel or unexpected calcification. Skull: Normal. Negative for fracture or focal lesion. Sinuses/Orbits: No acute finding. Other: None. CT CERVICAL SPINE FINDINGS Alignment: Mild loss of the normal cervical lordosis is noted likely related to muscular spasm. Skull base and vertebrae: 7 cervical segments are well visualized. Vertebral body height is well maintained. Facet hypertrophic changes are noted bilaterally but worst on the left at C4-5. Osteophytic changes are noted at C4-5, C5-6 and C6-7. No acute fracture or acute facet abnormality is noted. The odontoid is within normal limits. Soft tissues and spinal canal: No focal soft tissue abnormality is noted. Upper chest: Within normal limits. Other: None IMPRESSION: CT of the head: No acute intracranial abnormality noted. CT of the cervical spine: Degenerative changes without acute abnormality. Electronically Signed   By: Inez Catalina M.D.   On: 01/13/2018 13:21   US Abdomen Complete  Result Date: 01/02/2018 CLINICAL DATA:  Initial evaluation for splenomegaly. History of hypertonicity with cirrhosis. EXAM: ABDOMEN ULTRASOUND COMPLETE COMPARISON:  Prior CT from 07/24/2017. FINDINGS: Gallbladder: 1.6 cm calculus present within the gallbladder lumen. Gallbladder wall measure within normal limits of 1.9 mm. No free pericholecystic fluid. No sonographic Murphy sign elicited on exam. Common bile duct: Diameter: 5.5 mm Liver: Liver demonstrates coarse heterogeneous and increased echotexture  with nodular contour, consistent with cirrhosis. No focal intrahepatic lesion. Portal vein is patent on color Doppler imaging with normal direction of blood flow towards the liver. IVC: No abnormality visualized. Pancreas: Visualized portion unremarkable. Spleen: Size and appearance within normal limits, measuring 9.8 cm in craniocaudad dimension. Right Kidney: Length: 11.4 cm. Echogenicity within normal limits. No mass or hydronephrosis visualized. Left Kidney: Length: 13.7 cm. Echogenicity within normal limits. No mass or hydronephrosis visualized. Abdominal aorta: No aneurysm visualized. Other findings: None. IMPRESSION: 1. Spleen size within normal limits by sonography. No evidence for splenomegaly. 2. Hepatic cirrhosis. 3. Cholelithiasis. No sonographic features to suggest acute cholecystitis. No biliary dilatation. Electronically Signed   By: Jeannine Boga M.D.   On: 01/02/2018 22:18   Ct Biopsy  Result Date: 01/06/2018 INDICATION: 47 year old with thrombocytopenia. EXAM: CT GUIDED BONE MARROW ASPIRATES AND BIOPSY Physician: Stephan Minister. Anselm Pancoast, MD MEDICATIONS: None. ANESTHESIA/SEDATION: Fentanyl 100 mcg IV; Versed 2.0 mg IV Moderate Sedation Time:  10 minutes The patient was continuously monitored during the procedure by the interventional radiology nurse  under my direct supervision. COMPLICATIONS: None immediate. PROCEDURE: The procedure was explained to the patient. The risks and benefits of the procedure were discussed and the patient's questions were addressed. Informed consent was obtained from the patient. The patient was placed prone on CT table. Images of the pelvis were obtained. The right side of back was prepped and draped in sterile fashion. The skin and right posterior ilium were anesthetized with 1% lidocaine. 11 gauge bone needle was directed into the right ilium with CT guidance. Two aspirates and one core biopsy were obtained. Bandage placed over the puncture site. IMPRESSION: CT guided  bone marrow aspiration and core biopsy. Electronically Signed   By: Markus Daft M.D.   On: 01/06/2018 09:08   Ct Bone Marrow Biopsy & Aspiration  Result Date: 01/06/2018 INDICATION: 47 year old with thrombocytopenia. EXAM: CT GUIDED BONE MARROW ASPIRATES AND BIOPSY Physician: Stephan Minister. Anselm Pancoast, MD MEDICATIONS: None. ANESTHESIA/SEDATION: Fentanyl 100 mcg IV; Versed 2.0 mg IV Moderate Sedation Time:  10 minutes The patient was continuously monitored during the procedure by the interventional radiology nurse under my direct supervision. COMPLICATIONS: None immediate. PROCEDURE: The procedure was explained to the patient. The risks and benefits of the procedure were discussed and the patient's questions were addressed. Informed consent was obtained from the patient. The patient was placed prone on CT table. Images of the pelvis were obtained. The right side of back was prepped and draped in sterile fashion. The skin and right posterior ilium were anesthetized with 1% lidocaine. 11 gauge bone needle was directed into the right ilium with CT guidance. Two aspirates and one core biopsy were obtained. Bandage placed over the puncture site. IMPRESSION: CT guided bone marrow aspiration and core biopsy. Electronically Signed   By: Markus Daft M.D.   On: 01/06/2018 09:08       Subjective: No chest pain or sob. No nausea or vomiting.   Discharge Exam: Vitals:   01/17/18 0432 01/17/18 0826  BP: 118/85 121/89  Pulse: (!) 110 (!) 106  Resp: 18 18  Temp: 97.7 F (36.5 C) 98.8 F (37.1 C)  SpO2: 98% 99%   Vitals:   01/16/18 1413 01/16/18 2045 01/17/18 0432 01/17/18 0826  BP: 106/81 (!) 117/92 118/85 121/89  Pulse: 84 (!) 102 (!) 110 (!) 106  Resp: '16 16 18 18  '$ Temp: 98.2 F (36.8 C) 97.7 F (36.5 C) 97.7 F (36.5 C) 98.8 F (37.1 C)  TempSrc: Oral Oral Oral Oral  SpO2: 96% 99% 98% 99%  Weight:      Height:        General: Pt is alert, awake, not in acute distress Cardiovascular: RRR, S1/S2 +, no  rubs, no gallops Respiratory: CTA bilaterally, no wheezing, no rhonchi Abdominal: Soft, NT, ND, bowel sounds + Extremities: no edema, no cyanosis    The results of significant diagnostics from this hospitalization (including imaging, microbiology, ancillary and laboratory) are listed below for reference.     Microbiology: No results found for this or any previous visit (from the past 240 hour(s)).   Labs: BNP (last 3 results) No results for input(s): BNP in the last 8760 hours. Basic Metabolic Panel: Recent Labs  Lab 01/15/18 0509  NA 136  K 3.6  CL 104  CO2 22  GLUCOSE 93  BUN 5*  CREATININE 0.54  CALCIUM 8.6*   Liver Function Tests: No results for input(s): AST, ALT, ALKPHOS, BILITOT, PROT, ALBUMIN in the last 168 hours. No results for input(s): LIPASE, AMYLASE in the last  168 hours. No results for input(s): AMMONIA in the last 168 hours. CBC: Recent Labs  Lab 01/15/18 0509 01/16/18 0942 01/17/18 1138  WBC 2.0* 2.9* 3.6*  NEUTROABS  --  1.3* 1.7  HGB 10.2* 10.9* 12.0  HCT 32.1* 33.9* 37.2  MCV 92.5 92.1 92.3  PLT 22* 33* 42*   Cardiac Enzymes: No results for input(s): CKTOTAL, CKMB, CKMBINDEX, TROPONINI in the last 168 hours. BNP: Invalid input(s): POCBNP CBG: No results for input(s): GLUCAP in the last 168 hours. D-Dimer No results for input(s): DDIMER in the last 72 hours. Hgb A1c No results for input(s): HGBA1C in the last 72 hours. Lipid Profile No results for input(s): CHOL, HDL, LDLCALC, TRIG, CHOLHDL, LDLDIRECT in the last 72 hours. Thyroid function studies No results for input(s): TSH, T4TOTAL, T3FREE, THYROIDAB in the last 72 hours.  Invalid input(s): FREET3 Anemia work up No results for input(s): VITAMINB12, FOLATE, FERRITIN, TIBC, IRON, RETICCTPCT in the last 72 hours. Urinalysis    Component Value Date/Time   COLORURINE STRAW (A) 01/13/2018 1143   APPEARANCEUR CLEAR 01/13/2018 1143   LABSPEC 1.002 (L) 01/13/2018 1143   PHURINE 7.0  01/13/2018 1143   GLUCOSEU NEGATIVE 01/13/2018 1143   HGBUR NEGATIVE 01/13/2018 1143   BILIRUBINUR NEGATIVE 01/13/2018 1143   KETONESUR NEGATIVE 01/13/2018 1143   PROTEINUR NEGATIVE 01/13/2018 1143   UROBILINOGEN 0.2 12/02/2017 0836   NITRITE NEGATIVE 01/13/2018 1143   LEUKOCYTESUR NEGATIVE 01/13/2018 1143   Sepsis Labs Invalid input(s): PROCALCITONIN,  WBC,  LACTICIDVEN Microbiology No results found for this or any previous visit (from the past 240 hour(s)).   Time coordinating discharge: 34 minutes  SIGNED:   Hosie Poisson, MD  [facility] 01/21/2018, 11:59 AM Pager   If 7PM-7AM, please contact night-coverage [email or web address] Password [email or web address]

## 2018-01-23 ENCOUNTER — Ambulatory Visit: Payer: Self-pay | Attending: Family Medicine

## 2018-01-23 MED FILL — hydrOXYzine HCL 25 MG TABS: 25 | 6 days supply | Qty: 20 | Fill #0

## 2018-01-24 ENCOUNTER — Encounter (HOSPITAL_COMMUNITY): Payer: Self-pay | Admitting: Emergency Medicine

## 2018-01-24 ENCOUNTER — Emergency Department (HOSPITAL_COMMUNITY)
Admission: EM | Admit: 2018-01-24 | Discharge: 2018-01-25 | Disposition: A | Discharge status: Home / Self Care | Payer: Self-pay | Attending: Emergency Medicine | Admitting: Emergency Medicine

## 2018-01-24 DIAGNOSIS — B1710 Acute hepatitis C without hepatic coma: Secondary | ICD-10-CM | POA: Insufficient documentation

## 2018-01-24 DIAGNOSIS — F10920 Alcohol use, unspecified with intoxication, uncomplicated: Secondary | ICD-10-CM | POA: Insufficient documentation

## 2018-01-24 DIAGNOSIS — Y908 Blood alcohol level of 240 mg/100 ml or more: Secondary | ICD-10-CM | POA: Insufficient documentation

## 2018-01-24 DIAGNOSIS — R45851 Suicidal ideations: Secondary | ICD-10-CM | POA: Insufficient documentation

## 2018-01-24 DIAGNOSIS — F319 Bipolar disorder, unspecified: Secondary | ICD-10-CM | POA: Insufficient documentation

## 2018-01-24 DIAGNOSIS — Z79899 Other long term (current) drug therapy: Secondary | ICD-10-CM | POA: Insufficient documentation

## 2018-01-24 LAB — I-STAT BETA HCG BLOOD, ED (MC, WL, AP ONLY): I-stat hCG, quantitative: <5 m[IU]/mL (ref <5)

## 2018-01-24 LAB — CBC WITH DIFFERENTIAL/PLATELET
Basophils Absolute: 0 10*3/uL (ref 0.0–0.1)
Basophils Relative: 1 %
Eosinophils Absolute: 0.2 10*3/uL (ref 0.0–0.7)
Eosinophils Relative: 6 %
HCT: 39.6 % (ref 36.0–46.0)
Hemoglobin: 12.9 g/dL (ref 12.0–15.0)
Lymphocytes Relative: 54 %
Lymphs Abs: 2.1 10*3/uL (ref 0.7–4.0)
MCH: 29.5 pg (ref 26.0–34.0)
MCHC: 32.6 g/dL (ref 30.0–36.0)
MCV: 90.6 fL (ref 78.0–100.0)
Monocytes Absolute: 0.3 10*3/uL (ref 0.1–1.0)
Monocytes Relative: 9 %
Neutro Abs: 1.2 10*3/uL — ABNORMAL LOW (ref 1.7–7.7)
Neutrophils Relative %: 30 %
Platelets: DECREASED 10*3/uL (ref 150–400)
RBC: 4.37 MIL/uL (ref 3.87–5.11)
RDW: 18.5 % — ABNORMAL HIGH (ref 11.5–15.5)
WBC: 3.9 10*3/uL — ABNORMAL LOW (ref 4.0–10.5)

## 2018-01-24 LAB — COMPREHENSIVE METABOLIC PANEL
ALT: 20 U/L (ref 14–54)
AST: 48 U/L — ABNORMAL HIGH (ref 15–41)
Albumin: 3.8 g/dL (ref 3.5–5.0)
Alkaline Phosphatase: 160 U/L — ABNORMAL HIGH (ref 38–126)
Anion gap: 12 (ref 5–15)
BUN: 6 mg/dL (ref 6–20)
CO2: 22 mmol/L (ref 22–32)
Calcium: 8.4 mg/dL — ABNORMAL LOW (ref 8.9–10.3)
Chloride: 110 mmol/L (ref 101–111)
Creatinine, Ser: 0.71 mg/dL (ref 0.44–1.00)
GFR calc Af Amer: >60 mL/min (ref >60)
GFR calc non Af Amer: >60 mL/min (ref >60)
Glucose, Bld: 94 mg/dL (ref 65–99)
Potassium: 3.5 mmol/L (ref 3.5–5.1)
Sodium: 144 mmol/L (ref 135–145)
Total Bilirubin: 0.6 mg/dL (ref 0.3–1.2)
Total Protein: 9 g/dL — ABNORMAL HIGH (ref 6.5–8.1)

## 2018-01-24 LAB — SALICYLATE LEVEL: Salicylate Lvl: <7 mg/dL (ref 2.8–30.0)

## 2018-01-24 LAB — ACETAMINOPHEN LEVEL: Acetaminophen (Tylenol), Serum: <10 ug/mL — ABNORMAL LOW (ref 10–30)

## 2018-01-24 LAB — RAPID URINE DRUG SCREEN, HOSP PERFORMED
Amphetamines: NOT DETECTED
Barbiturates: NOT DETECTED
Benzodiazepines: POSITIVE — AB
Cocaine: NOT DETECTED
Opiates: NOT DETECTED
Tetrahydrocannabinol: NOT DETECTED

## 2018-01-24 LAB — ETHANOL: Alcohol, Ethyl (B): 315 mg/dL (ref <10)

## 2018-01-24 MED ORDER — LORAZEPAM 1 MG PO TABS: 0.0000 mg | ORAL_TABLET | Freq: Two times a day (BID) | ORAL | Status: DC

## 2018-01-24 MED ORDER — THIAMINE HCL 100 MG/ML IJ SOLN: 100.0000 mg | Freq: Every day | INTRAMUSCULAR | Status: DC

## 2018-01-24 MED ORDER — VITAMIN B-1 100 MG PO TABS: 100.0000 mg | ORAL_TABLET | Freq: Every day | ORAL | Status: DC

## 2018-01-24 MED ORDER — LORAZEPAM 1 MG PO TABS: 0.0000 mg | ORAL_TABLET | Freq: Four times a day (QID) | ORAL | Status: DC

## 2018-01-24 MED ORDER — LORAZEPAM 2 MG/ML IJ SOLN: 0.0000 mg | Freq: Two times a day (BID) | INTRAMUSCULAR | Status: DC

## 2018-01-24 MED ORDER — SODIUM CHLORIDE 0.9 % IV BOLUS
1000.0000 mL | Freq: Once | INTRAVENOUS | Status: AC
  Administered 2018-01-24: 1000 mL via INTRAVENOUS

## 2018-01-24 MED ORDER — LORAZEPAM 2 MG/ML IJ SOLN: 0.0000 mg | Freq: Four times a day (QID) | INTRAMUSCULAR | Status: DC

## 2018-01-24 MED ORDER — ONDANSETRON HCL 4 MG PO TABS
4.0000 mg | ORAL_TABLET | Freq: Three times a day (TID) | ORAL | Status: DC | PRN
  Administered 2018-01-25: 4 mg via ORAL
  Filled 2018-01-24: qty 1

## 2018-01-24 NOTE — ED Notes (Signed)
Briana Hua PA reports that pt is voicing suicidal ideation with plan of overdosing on medications in purse.

## 2018-01-24 NOTE — ED Provider Notes (Signed)
  Physical Exam  BP (!) 130/96 (BP Location: Right Arm)   Pulse 83   Temp 98.3 F (36.8 C) (Oral)   Resp 18   Ht 5\' 2"  (1.575 m)   Wt 59 kg (130 lb)   LMP  (LMP Unknown)   SpO2 100%   BMI 23.78 kg/m   Physical Exam  Constitutional: She appears well-developed and well-nourished. No distress.  Nontoxic appearing and in no acute distress. Speaking in point sentences without difficulty.  HENT:  Head: Normocephalic and atraumatic.  Eyes: Conjunctivae and EOM are normal. No scleral icterus.  Neck: Normal range of motion.  Pulmonary/Chest: Effort normal. No respiratory distress.  Neurological: She is alert.  Skin: No rash noted. She is not diaphoretic.  Psychiatric: She has a normal mood and affect.  Nursing note and vitals reviewed.   ED Course/Procedures     Procedures  MDM  Care handed off from previous provider, K.Ford, PA-C.  Please see their note for further detail.  Briefly, patient presents with history of alcohol abuse and depression.  She is currently intoxicated with alcohol level in the 300s.  She does endorse some suicidal ideations although she is still intoxicated.  She stated that because of recent family stressors, she wanted to take all the pills in her purse.  Will reevaluate after patient is sober.  On reexamination, patient is sober and has been ambulating with normal gait.  I asked her about any suicidal ideations and she denies several times.  She denies any HI, AVH.  She states that she just wants her home medications and to go home.  She denies any other complaints.  Patient is stable for discharge home.  Portions of this note were generated with [employer]. Dictation errors may occur despite best attempts at proofreading.      Delia Heady, PA-C 01/24/18 2346    Shanon Rosser, MD 01/25/18 [phone]

## 2018-01-24 NOTE — ED Triage Notes (Signed)
Per EMS, endorses drinking beer and liquor today. Ambulatory with EMS.

## 2018-01-24 NOTE — ED Provider Notes (Addendum)
[facility] DEPT Provider Note   CSN: [id number] Arrival date & time: 01/24/18  1846     History   Chief Complaint Chief Complaint  Patient presents with  . Alcohol Intoxication  . Suicidal    HPI Briana Sweeney is a 47 y.o. female.  Briana Sweeney is a 47 y.o. Female with a history of bipolar disorder, depression, schizophrenia, migraines, seizures, alcohol abuse and cirrhosis, who presents to the emergency department via EMS for evaluation of alcohol intoxication.  On my evaluation as I approach the patient she is crying and is not initially willing to speak with me.  Patient initially reports she has been going through a lot, lost her boyfriend, lost her job and is homeless.  Patient reports "I just want to take all the pills in my purse and end it all".  I questioned the patient multiple times and she continues to endorse SI.  She denies HI or AVH.  Patient reports she has been drinking liquor and beer today and reports she drinks every day.  Patient denies any acute pain she denies any headaches, vision changes, chest pain, shortness of breath or abdominal pain.  Patient reports intermittent nausea and vomiting, but no vomiting today.  Patient is very irritable and becomes easily agitated with questioning.  Level 5 caveat: Acute alcohol intoxication     Past Medical History:  Diagnosis Date  . Anxiety   . Bipolar affective disorder ([place])    With anxiety features  . Cirrhosis of liver ([place])    Due to alcohol and hepatitis C  . Depression   . GERD (gastroesophageal reflux disease)   . Hepatitis C 2018   hepatitis c and alcohol related hepatitis  . History of blood transfusion    "blood doesn't clot; I fell down and had to have a transfusion"  . History of kidney stones   . Migraine    "when I get really stressed" (09/01/2017)  . Schizophrenia ([place])   . Seizures ([place])    "when I run out of my RX; lots recently" (09/01/2017)    Patient Active  Problem List   Diagnosis Date Noted  . Chronic anemia   . Thrombocytopenia due to drugs   . Suicidal ideation   . Thrombocytopenia ([place]) 01/02/2018  . GERD (gastroesophageal reflux disease) 11/01/2017  . Trichimoniasis 10/30/2017  . Alcohol dependence ([place]) 10/29/2017  . MDD (major depressive disorder), severe ([place]) 10/28/2017  . Acute hyperactive alcohol withdrawal delirium ([place]) 10/09/2017  . Schizophrenia ([place]) 10/09/2017  . Alcohol abuse with alcohol-induced mood disorder ([place])   . Alcohol withdrawal syndrome with complication ([place]) 02/54/2706  . Esophageal varices without bleeding ([place])   . Hematemesis 09/01/2017  . Ascites due to alcoholic cirrhosis ([place])   . Decompensated hepatic cirrhosis ([place]) 07/23/2017  . Alcohol abuse 07/23/2017  . Hepatitis C antibody positive in blood 07/23/2017  . Hypokalemia 07/23/2017  . Jaundice 07/23/2017  . Coagulopathy ([place]) 07/23/2017  . Hypomagnesemia 07/23/2017  . Pancytopenia ([place]) 07/23/2017  . Alcoholic cirrhosis of liver with ascites ([place]) 07/23/2017    Past Surgical History:  Procedure Laterality Date  . ESOPHAGOGASTRODUODENOSCOPY N/A 09/03/2017   Procedure: ESOPHAGOGASTRODUODENOSCOPY (EGD);  Surgeon: Doran Stabler, MD;  Location: [facility];  Service: Gastroenterology;  Laterality: N/A;  . FINGER FRACTURE SURGERY Left    "shattered my pinky"  . FRACTURE SURGERY    . IR PARACENTESIS  07/23/2017  . IR PARACENTESIS  07/2017   "did it twice in the same  week" (09/01/2017)  . SHOULDER OPEN ROTATOR CUFF REPAIR Right   . TUBAL LIGATION    . VAGINAL HYSTERECTOMY       OB History   None      Home Medications    Prior to Admission medications   Medication Sig Start Date End Date Taking? Authorizing Provider  busPIRone (BUSPAR) 10 MG tablet Take 1 tablet (10 mg total) by mouth 2 (two) times daily. 01/08/18  Yes Eugenie Filler, MD  chlordiazePOXIDE (LIBRIUM) 10 MG capsule Take 10 mg by mouth 3 (three) times daily as  needed for anxiety.   Yes [provider]  diazepam (VALIUM) 5 MG tablet Take 5 mg by mouth daily.   Yes [provider]  famotidine (PEPCID) 20 MG tablet Take 1 tablet (20 mg total) by mouth 2 (two) times daily. 01/08/18  Yes Eugenie Filler, MD  FLUoxetine (PROZAC) 20 MG capsule Take 1 capsule (20 mg total) by mouth daily. For mood control 01/08/18  Yes Eugenie Filler, MD  gabapentin (NEURONTIN) 100 MG capsule Take 2 capsules (200 mg total) by mouth 3 (three) times daily. 01/08/18  Yes Eugenie Filler, MD  haloperidol (HALDOL) 2 MG tablet Take 1 tablet (2 mg total) by mouth at bedtime. 01/08/18  Yes Eugenie Filler, MD  hydrOXYzine (ATARAX/VISTARIL) 25 MG tablet Take 1 tablet (25 mg total) by mouth 3 (three) times daily as needed for anxiety. 01/08/18  Yes Eugenie Filler, MD  lactulose (CHRONULAC) 10 GM/15ML solution Take 30 mLs (20 g total) by mouth daily. 01/09/18  Yes Eugenie Filler, MD  phenytoin (DILANTIN) 100 MG ER capsule Take 300 mg by mouth at bedtime.   Yes [provider]  topiramate (TOPAMAX) 50 MG tablet Take 1 tablet (50 mg total) by mouth daily. 01/08/18  Yes Eugenie Filler, MD  traMADol (ULTRAM) 50 MG tablet Take 1 tablet (50 mg total) by mouth every 6 (six) hours as needed for moderate pain. 01/08/18  Yes Eugenie Filler, MD  traZODone (DESYREL) 50 MG tablet Take 1 tablet (50 mg total) by mouth at bedtime as needed for sleep. 01/08/18  Yes Eugenie Filler, MD  folic acid (FOLVITE) 1 MG tablet Take 1 tablet (1 mg total) by mouth daily. Patient not taking: Reported on 01/24/2018 01/09/18   Eugenie Filler, MD  furosemide (LASIX) 20 MG tablet Take 1 tablet (20 mg total) by mouth daily. Patient not taking: Reported on 01/24/2018 01/08/18   Eugenie Filler, MD  Multiple Vitamin (MULTIVITAMIN WITH MINERALS) TABS tablet Take 1 tablet by mouth daily. Patient not taking: Reported on 01/24/2018 01/09/18   Eugenie Filler, MD  spironolactone  (ALDACTONE) 50 MG tablet Take 1 tablet (50 mg total) by mouth daily. Patient not taking: Reported on 01/24/2018 01/08/18   Eugenie Filler, MD  thiamine 100 MG tablet Take 1 tablet (100 mg total) by mouth daily. Patient not taking: Reported on 01/24/2018 01/09/18   Eugenie Filler, MD    Family History Family History  Problem Relation Age of Onset  . Lung cancer Mother 2  . Alcohol abuse Mother   . Throat cancer Father 24    Social History Social History   Tobacco Use  . Smoking status: Never Smoker  . Smokeless tobacco: Never Used  Substance Use Topics  . Alcohol use: Yes    Alcohol/week: 16.8 oz    Types: 28 Cans of beer per week    Comment: weekly "I have  cut back"  . Drug use: No     Allergies   Patient has no known allergies.   Review of Systems Review of Systems  Unable to perform ROS: Other (Acute alcohol intoxication)     Physical Exam Updated Vital Signs BP (!) 110/92 (BP Location: Left Arm)   Pulse (!) 107   Temp 98.3 F (36.8 C) (Oral)   Resp 20   Ht 5\' 2"  (1.575 m)   Wt 59 kg (130 lb)   LMP  (LMP Unknown)   SpO2 100%   BMI 23.78 kg/m   Physical Exam  Constitutional: She is oriented to person, place, and time. No distress.  Intoxicated, speaking loudly, restless  HENT:  Head: Normocephalic and atraumatic.  Eyes: Pupils are equal, round, and reactive to light. EOM are normal. Right eye exhibits no discharge. Left eye exhibits no discharge.  Neck: Neck supple.  Cardiovascular: Normal rate, regular rhythm, normal heart sounds and intact distal pulses.  Pulmonary/Chest: Effort normal and breath sounds normal. No stridor. No respiratory distress. She has no wheezes. She has no rales.  Respirations equal and unlabored, patient able to speak in full sentences, lungs clear to auscultation bilaterally  Abdominal: Soft. Bowel sounds are normal. She exhibits no distension and no mass. There is no tenderness. There is no guarding.  Abdomen soft,  nondistended, nontender to palpation in all quadrants without guarding or peritoneal signs  Musculoskeletal: She exhibits no edema or deformity.  Neurological: She is alert and oriented to person, place, and time. Coordination normal.  Skin: Skin is warm and dry. Capillary refill takes less than 2 seconds. She is not diaphoretic.  Psychiatric: She has a normal mood and affect. Her behavior is normal.  Nursing note and vitals reviewed.    ED Treatments / Results  Labs (all labs ordered are listed, but only abnormal results are displayed) Labs Reviewed  COMPREHENSIVE METABOLIC PANEL - Abnormal; Notable for the following components:      Result Value   Calcium 8.4 (*)    Total Protein 9.0 (*)    AST 48 (*)    Alkaline Phosphatase 160 (*)    All other components within normal limits  ETHANOL - Abnormal; Notable for the following components:   Alcohol, Ethyl (B) 315 (*)    All other components within normal limits  RAPID URINE DRUG SCREEN, HOSP PERFORMED - Abnormal; Notable for the following components:   Benzodiazepines POSITIVE (*)    All other components within normal limits  CBC WITH DIFFERENTIAL/PLATELET - Abnormal; Notable for the following components:   WBC 3.9 (*)    RDW 18.5 (*)    Neutro Abs 1.2 (*)    All other components within normal limits  ACETAMINOPHEN LEVEL - Abnormal; Notable for the following components:   Acetaminophen (Tylenol), Serum <10 (*)    All other components within normal limits  SALICYLATE LEVEL  I-STAT BETA HCG BLOOD, ED ([facility], [facility], [facility] ONLY)    EKG EKG Interpretation  Date/Time:  Saturday Jan 24 2018 21:30:41 EDT Ventricular Rate:  85 PR Interval:  174 QRS Duration: 76 QT Interval:  406 QTC Calculation: 483 R Axis:   75 Text Interpretation:  Normal sinus rhythm Prolonged QT No significant change since last tracing Confirmed by Lajean Saver [phone]) on 01/24/2018 9:52:33 PM   Radiology No results found.  Procedures Procedures (including  critical care time)  Medications Ordered in ED Medications  sodium chloride 0.9 % bolus 1,000 mL (1,000 mLs Intravenous New Bag/Given 01/24/18  2108)     Initial Impression / Assessment and Plan / ED Course  I have reviewed the triage vital signs and the nursing notes.  Pertinent labs & imaging results that were available during my care of the patient were reviewed by me and considered in my medical decision making (see chart for details).  Patient presents with acute alcohol intoxication, she has a long history of drinking and alcoholic cirrhosis, reports daily alcohol use.  On my evaluation patient is endorsing SI, she reports she has many medications in her purse and repeatedly threatens to take them all.  Patient denies any focal medical complaints.  She is mildly tachycardic on arrival but this resolved, no other focal findings on exam.  Given repeated threats of SI, sitter at bedside.  Will get medical clearance labs including alcohol level, and give 1 L IV fluids.   Alcohol level of 315.  No acute electrolyte derangements, renal and liver function appear to be the patient's baseline.  No leukocytosis, normal hemoglobin.  UDS is positive for benzodiazepines, otherwise negative.  Negative acetaminophen and salicylate levels.  Will reassess patient after she is clinically sober, will reevaluate patient's suicidal ideations after she is clinically sober.  At shift change care signed out to [place], who will reevaluate patient after she is clinically sober and determine if patient is still expressing suicidal ideations. CIWA Protocol ordered.  If not patient will be stable for discharge home.  Patient discussed with Dr. Ashok Cordia, who saw patient as well and agrees with plan.   Final Clinical Impressions(s) / ED Diagnoses   Final diagnoses:  Alcoholic intoxication without complication [facility])  Suicidal ideation    ED Discharge Orders    None       Jacqlyn Larsen, [place] 01/24/18  2319    Jacqlyn Larsen, PA-C 01/24/18 2334    Lajean Saver, MD 01/24/18 2337

## 2018-01-24 NOTE — ED Notes (Signed)
Pt c/o of locking jaw sensation

## 2018-01-24 NOTE — ED Notes (Signed)
Pt begins to c/o neck pain and "fire eyes". Pt c/o of auditory hallucinations of "hearing someone coming"

## 2018-01-25 ENCOUNTER — Emergency Department (HOSPITAL_COMMUNITY)
Admission: EM | Admit: 2018-01-25 | Discharge: 2018-01-25 | Disposition: A | Discharge status: Home / Self Care | Payer: Self-pay | Attending: Emergency Medicine | Admitting: Emergency Medicine

## 2018-01-25 ENCOUNTER — Encounter (HOSPITAL_COMMUNITY): Payer: Self-pay | Admitting: Emergency Medicine

## 2018-01-25 ENCOUNTER — Other Ambulatory Visit: Payer: Self-pay

## 2018-01-25 DIAGNOSIS — R569 Unspecified convulsions: Secondary | ICD-10-CM | POA: Insufficient documentation

## 2018-01-25 DIAGNOSIS — Z5321 Procedure and treatment not carried out due to patient leaving prior to being seen by health care provider: Secondary | ICD-10-CM | POA: Insufficient documentation

## 2018-01-25 DIAGNOSIS — F10929 Alcohol use, unspecified with intoxication, unspecified: Secondary | ICD-10-CM | POA: Insufficient documentation

## 2018-01-25 NOTE — ED Triage Notes (Signed)
Pt just dc from [facility] for same c/o and ETOH intoxication, pt states as soon as she got dc from [facility] she had another seizure and she states she ran out of her medication.

## 2018-01-25 NOTE — ED Notes (Signed)
Pt has in belonging bag:  Pink sweater, grey t-shirt, blue denim shorts, black belt, black and grey bra, black sandals, multiple silvered colored bracelet, two necklace, one silver colored ring, one pair of silver colored earrings, one silver colored nose ring.

## 2018-01-25 NOTE — ED Notes (Signed)
Attempted to get a pt to a room to be seen no respond gotten.

## 2018-01-25 NOTE — ED Notes (Signed)
Called to take to treatment room no answer in lobby

## 2018-02-02 ENCOUNTER — Emergency Department (HOSPITAL_COMMUNITY)
Admission: EM | Admit: 2018-02-02 | Discharge: 2018-02-02 | Disposition: A | Discharge status: Home / Self Care | Payer: Self-pay | Attending: Emergency Medicine | Admitting: Emergency Medicine

## 2018-02-02 ENCOUNTER — Encounter (HOSPITAL_COMMUNITY): Payer: Self-pay

## 2018-02-02 DIAGNOSIS — Z5321 Procedure and treatment not carried out due to patient leaving prior to being seen by health care provider: Secondary | ICD-10-CM | POA: Insufficient documentation

## 2018-02-02 DIAGNOSIS — R569 Unspecified convulsions: Secondary | ICD-10-CM | POA: Insufficient documentation

## 2018-02-02 LAB — COMPREHENSIVE METABOLIC PANEL
ALT: 19 U/L (ref 14–54)
AST: 67 U/L — ABNORMAL HIGH (ref 15–41)
Albumin: 3.5 g/dL (ref 3.5–5.0)
Alkaline Phosphatase: 160 U/L — ABNORMAL HIGH (ref 38–126)
Anion gap: 10 (ref 5–15)
BUN: <5 mg/dL — ABNORMAL LOW (ref 6–20)
CO2: 24 mmol/L (ref 22–32)
Calcium: 8.3 mg/dL — ABNORMAL LOW (ref 8.9–10.3)
Chloride: 108 mmol/L (ref 101–111)
Creatinine, Ser: 0.54 mg/dL (ref 0.44–1.00)
GFR calc Af Amer: >60 mL/min (ref >60)
GFR calc non Af Amer: >60 mL/min (ref >60)
Glucose, Bld: 95 mg/dL (ref 65–99)
Potassium: 4 mmol/L (ref 3.5–5.1)
Sodium: 142 mmol/L (ref 135–145)
Total Bilirubin: 0.9 mg/dL (ref 0.3–1.2)
Total Protein: 8.5 g/dL — ABNORMAL HIGH (ref 6.5–8.1)

## 2018-02-02 LAB — CBC WITH DIFFERENTIAL/PLATELET
Basophils Absolute: 0 10*3/uL (ref 0.0–0.1)
Basophils Relative: 1 %
Eosinophils Absolute: 0.2 10*3/uL (ref 0.0–0.7)
Eosinophils Relative: 7 %
HCT: 37 % (ref 36.0–46.0)
Hemoglobin: 11.8 g/dL — ABNORMAL LOW (ref 12.0–15.0)
Lymphocytes Relative: 50 %
Lymphs Abs: 1.2 10*3/uL (ref 0.7–4.0)
MCH: 28.4 pg (ref 26.0–34.0)
MCHC: 31.9 g/dL (ref 30.0–36.0)
MCV: 89.2 fL (ref 78.0–100.0)
Monocytes Absolute: 0.2 10*3/uL (ref 0.1–1.0)
Monocytes Relative: 9 %
Neutro Abs: 0.8 10*3/uL — ABNORMAL LOW (ref 1.7–7.7)
Neutrophils Relative %: 33 %
Platelets: 19 10*3/uL — CL (ref 150–400)
RBC: 4.15 MIL/uL (ref 3.87–5.11)
RDW: 19.2 % — ABNORMAL HIGH (ref 11.5–15.5)
WBC: 2.4 10*3/uL — ABNORMAL LOW (ref 4.0–10.5)

## 2018-02-02 LAB — RAPID URINE DRUG SCREEN, HOSP PERFORMED
Amphetamines: NOT DETECTED
Barbiturates: NOT DETECTED
Benzodiazepines: POSITIVE — AB
Cocaine: POSITIVE — AB
Opiates: NOT DETECTED
Tetrahydrocannabinol: NOT DETECTED

## 2018-02-02 LAB — ETHANOL: Alcohol, Ethyl (B): 353 mg/dL (ref <10)

## 2018-02-02 NOTE — ED Triage Notes (Signed)
Pt presents for evaluation of seizure like activity today. Pt reports she has "silent seizures." per EMS she is able to say when it will begin and end. No generalized tonic-clonic movement. ETOH, pain meds, and suspected cocaine on board per people at residence.

## 2018-02-02 NOTE — ED Notes (Signed)
Pt approached desk stating "I want to be transferred to [facility], I keep Seizing and cannot wait out here," Patient was encouraged to stay. Pt was seen walking out.

## 2018-02-02 NOTE — ED Provider Notes (Cosign Needed)
Patient placed in Quick Look pathway, seen and evaluated   Chief Complaint: seizures  HPI:   Patient presents for seizure-like activity.  She states that she has "silent seizures" for almost all of her life but have worsened in the past 4 days.  She will let someone know when they began, then she states that "everything shuts down," she stares to the distance, and then after 15 minutes she is back to her baseline.  He states that it happened prior to arrival.  States that they are "stronger" recently.  Patient appears to be under the influence and her significant other states that alcohol, pain medication and cocaine on board.  She is on Dilantin for her seizures but apparently her roommates have been taking all of her medications and snorting them.  ROS: Seizures  Physical Exam:   Gen: No distress  Neuro: Awake and Alert  Skin: Warm    Focused Exam: Patient appears under the influence.  Alert to self, thinks she is in [place] in the year 1999. Pupils equal and reactive. Equal grip strengths bilaterally. No tongue trauma noted. Cranial nerves appear grossly intact.   Initiation of care has begun. The patient has been counseled on the process, plan, and necessity for staying for the completion/evaluation, and the remainder of the medical screening examination    Delia Heady, PA-C 02/02/18 [id number]

## 2018-02-04 LAB — PATHOLOGIST SMEAR REVIEW

## 2018-02-05 ENCOUNTER — Encounter: Payer: Self-pay | Admitting: Family Medicine

## 2018-02-05 ENCOUNTER — Telehealth: Payer: Self-pay

## 2018-02-05 ENCOUNTER — Other Ambulatory Visit: Payer: Self-pay

## 2018-02-05 ENCOUNTER — Ambulatory Visit (INDEPENDENT_AMBULATORY_CARE_PROVIDER_SITE_OTHER): Payer: Medicaid Other | Admitting: Family Medicine

## 2018-02-05 VITALS — BP 110/72 | HR 76 | Temp 97.0°F | Ht 62.0 in | Wt 130.0 lb

## 2018-02-05 DIAGNOSIS — Z09 Encounter for follow-up examination after completed treatment for conditions other than malignant neoplasm: Secondary | ICD-10-CM | POA: Diagnosis not present

## 2018-02-05 DIAGNOSIS — F1910 Other psychoactive substance abuse, uncomplicated: Secondary | ICD-10-CM

## 2018-02-05 DIAGNOSIS — D696 Thrombocytopenia, unspecified: Secondary | ICD-10-CM | POA: Diagnosis not present

## 2018-02-05 DIAGNOSIS — B182 Chronic viral hepatitis C: Secondary | ICD-10-CM

## 2018-02-05 DIAGNOSIS — Z1389 Encounter for screening for other disorder: Secondary | ICD-10-CM | POA: Diagnosis not present

## 2018-02-05 DIAGNOSIS — F1010 Alcohol abuse, uncomplicated: Secondary | ICD-10-CM

## 2018-02-05 LAB — POCT URINALYSIS DIPSTICK
Bilirubin, UA: NEGATIVE
Blood, UA: NEGATIVE
Glucose, UA: NEGATIVE
Ketones, UA: NEGATIVE
Leukocytes, UA: NEGATIVE
Nitrite, UA: NEGATIVE
Protein, UA: NEGATIVE
Spec Grav, UA: 1.02 (ref 1.010–1.025)
Urobilinogen, UA: 1 E.U./dL
pH, UA: 7 (ref 5.0–8.0)

## 2018-02-05 MED ORDER — TOPIRAMATE 50 MG PO TABS: 50.0000 mg | ORAL_TABLET | Freq: Every day | ORAL | 0 refills | Status: DC

## 2018-02-05 MED ORDER — TRAZODONE HCL 50 MG PO TABS: 50.0000 mg | ORAL_TABLET | Freq: Every evening | ORAL | 0 refills | Status: DC | PRN

## 2018-02-05 MED ORDER — BUSPIRONE HCL 10 MG PO TABS: 10.0000 mg | ORAL_TABLET | Freq: Two times a day (BID) | ORAL | 0 refills | Status: DC

## 2018-02-05 MED ORDER — HYDROXYZINE HCL 25 MG PO TABS: 25.0000 mg | ORAL_TABLET | Freq: Three times a day (TID) | ORAL | 0 refills | Status: DC | PRN

## 2018-02-05 NOTE — Telephone Encounter (Signed)
Called patient twice no answer and no vm

## 2018-02-05 NOTE — Telephone Encounter (Signed)
Medication sent to pharmacy  

## 2018-02-05 NOTE — Progress Notes (Signed)
Subjective:     Patient ID: Briana Sweeney, female   DOB: June 28, 1971, 47 y.o.   MRN: [id number]   PCP: Kathe Becton, NP  Chief Complaint  Patient presents with  . Hospitalization Follow-up    HPI  Briana Sweeney has a history of Thrombocytopenia, Seizures, Schizophrenia, Migraine Headaches, Hepatitis C, GERD, Depression, Cirrhosis, and Bipolar Disorder. Her most recent ED admissions were for seizures, and pancytopenia on 01/02/2018, which she received platelets (platelet count < 18), and admission on 01/13/2018 for alcohol intoxication. She was scheduled with infection disease on 10/28/2017 and an appointment with Hematology on 01/29/2018.  She is here today for hospital follow up for her most ED visit on 02/02/2018.  Current Status: Since her last visit on 12/02/2017, patient patient has had multiple visits to the ED. She is in no apparent distress today. She ambulates well. It is noted that some of her anxiousness and conversation are similar those of someone under the influence of some substance.  She continues to talk and laugh about a recent incident on the local news, and states that she almost was almost involved in.    She was positive for cocaine and benzodiazepines on 02/02/2018 and ethanol level was 353. She states that she is drinking a 6 pack of beer daily, and 'has stopped drinking Vodka every day, because of her liver condition.'    Her platelets count was at 19 on 02/02/2018. She continues to deny easy bruising, petechiae, gingival bleeding, epistaxis, profuse bleeding from superficial cuts, menorrhagia, mentrorrhagia, hematochezia, and hematuria. She states that her appetite is good. Denies abdominal pain, nausea, vomiting, diarrhea, and constipation.   She denies pain today.   Past Medical History:  Diagnosis Date  . Anxiety   . Bipolar affective disorder ([place])    With anxiety features  . 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 Depression   . GERD  (gastroesophageal reflux disease)   . Hepatitis C 2018   hepatitis c and alcohol related hepatitis  . History of blood transfusion    "blood doesn't clot; I fell down and had to have a transfusion"  . History of kidney stones   . Migraine    "when I get really stressed" (09/01/2017)  . Schizophrenia ([place])   . Seizures ([place])    "when I run out of my RX; lots recently" (09/01/2017)    Family History  Problem Relation Age of Onset  . Lung cancer Mother 50  . Alcohol abuse Mother   . Throat cancer Father 29    Social History   Socioeconomic History  . Marital status: Divorced    Spouse name: Not on file  . Number of children: Not on file  . Years of education: Not on file  . Highest education level: Not on file  Occupational History  . Occupation: applying for disability  Social Needs  . Financial resource strain: Not on file  . Food insecurity:    Worry: Not on file    Inability: Not on file  . Transportation needs:    Medical: Not on file    Non-medical: Not on file  Tobacco Use  . Smoking status: Never Smoker  . Smokeless tobacco: Never Used  Substance and Sexual Activity  . Alcohol use: Yes    Alcohol/week: 16.8 oz    Types: 28 Cans of beer per week    Comment: weekly "I have cut back"  . Drug use: No  . Sexual  activity: Not Currently  Lifestyle  . Physical activity:    Days per week: Not on file    Minutes per session: Not on file  . Stress: Not on file  Relationships  . Social connections:    Talks on phone: Not on file    Gets together: Not on file    Attends religious service: Not on file    Active member of club or organization: Not on file    Attends meetings of clubs or organizations: Not on file    Relationship status: Not on file  . Intimate partner violence:    Fear of current or ex partner: Not on file    Emotionally abused: Not on file    Physically abused: Not on file    Forced sexual activity: Not on file  Other Topics Concern  . Not on  file  Social History Narrative   She moved with a boyfriend to [place] and was followed at [facility].  He died of a massive heart attack in 8/18, per her report, and so she moved back to [place] and is living with a friend.    Past Surgical History:  Procedure Laterality Date  . ESOPHAGOGASTRODUODENOSCOPY N/A 09/03/2017   Procedure: ESOPHAGOGASTRODUODENOSCOPY (EGD);  Surgeon: Doran Stabler, MD;  Location: [facility];  Service: Gastroenterology;  Laterality: N/A;  . FINGER FRACTURE SURGERY Left    "shattered my pinky"  . FRACTURE SURGERY    . IR PARACENTESIS  07/23/2017  . IR PARACENTESIS  07/2017   "did it twice in the same week" (09/01/2017)  . SHOULDER OPEN ROTATOR CUFF REPAIR Right   . TUBAL LIGATION    . VAGINAL HYSTERECTOMY     Immunization History  Administered Date(s) Administered  . Influenza,inj,Quad PF,6+ Mos 09/04/2017  . Pneumococcal Polysaccharide-23 09/04/2017    Current Meds  Medication Sig  . busPIRone (BUSPAR) 10 MG tablet Take 1 tablet (10 mg total) by mouth 2 (two) times daily.  . chlordiazePOXIDE (LIBRIUM) 10 MG capsule Take 10 mg by mouth 3 (three) times daily as needed for anxiety.  . famotidine (PEPCID) 20 MG tablet Take 1 tablet (20 mg total) by mouth 2 (two) times daily.  . folic acid (FOLVITE) 1 MG tablet Take 1 tablet (1 mg total) by mouth daily.  . furosemide (LASIX) 20 MG tablet Take 1 tablet (20 mg total) by mouth daily.  [name] Kitchen gabapentin (NEURONTIN) 100 MG capsule Take 2 capsules (200 mg total) by mouth 3 (three) times daily.  . haloperidol (HALDOL) 2 MG tablet Take 1 tablet (2 mg total) by mouth at bedtime.  . hydrOXYzine (ATARAX/VISTARIL) 25 MG tablet Take 1 tablet (25 mg total) by mouth 3 (three) times daily as needed for anxiety.  [name] Kitchen lactulose (CHRONULAC) 10 GM/15ML solution Take 30 mLs (20 g total) by mouth daily.  . Multiple Vitamin (MULTIVITAMIN WITH MINERALS) TABS tablet Take 1 tablet by mouth daily.  . phenytoin (DILANTIN)  100 MG ER capsule Take 300 mg by mouth at bedtime.  [name] Kitchen spironolactone (ALDACTONE) 50 MG tablet Take 1 tablet (50 mg total) by mouth daily.  [name] Kitchen thiamine 100 MG tablet Take 1 tablet (100 mg total) by mouth daily.  [name] Kitchen topiramate (TOPAMAX) 50 MG tablet Take 1 tablet (50 mg total) by mouth daily.  . traMADol (ULTRAM) 50 MG tablet Take 1 tablet (50 mg total) by mouth every 6 (six) hours as needed for moderate pain.  . traZODone (DESYREL) 50 MG tablet Take 1 tablet (50 mg  total) by mouth at bedtime as needed for sleep.  . [DISCONTINUED] busPIRone (BUSPAR) 10 MG tablet Take 1 tablet (10 mg total) by mouth 2 (two) times daily.  . [DISCONTINUED] hydrOXYzine (ATARAX/VISTARIL) 25 MG tablet Take 1 tablet (25 mg total) by mouth 3 (three) times daily as needed for anxiety.  . [DISCONTINUED] topiramate (TOPAMAX) 50 MG tablet Take 1 tablet (50 mg total) by mouth daily.  . [DISCONTINUED] traZODone (DESYREL) 50 MG tablet Take 1 tablet (50 mg total) by mouth at bedtime as needed for sleep.   No Known Allergies  BP 110/72 (BP Location: Left Arm, Patient Position: Sitting, Cuff Size: Small)   Pulse 76   Temp (!) 97 F (36.1 C) (Oral)   Ht 5\' 2"  (1.575 m)   Wt 130 lb (59 kg)   LMP  (LMP Unknown)   SpO2 98%   BMI 23.78 kg/m    Review of Systems  Constitutional: Negative.   HENT: Negative.   Eyes: Negative.   Respiratory: Negative.   Cardiovascular: Negative.   Gastrointestinal: Negative.   Endocrine: Negative.   Genitourinary: Negative.   Musculoskeletal: Negative.   Skin: Negative.   Allergic/Immunologic: Negative.   Neurological: Negative.   Hematological: Negative.   Psychiatric/Behavioral: Negative.    Objective:   Physical Exam  Constitutional: She is oriented to person, place, and time. She appears well-developed and well-nourished.  HENT:  Head: Normocephalic and atraumatic.  Right Ear: External ear normal.  Left Ear: External ear normal.  Nose: Nose normal.  Mouth/Throat: Oropharynx is  clear and moist.  Eyes: Pupils are equal, round, and reactive to light. Conjunctivae and EOM are normal.  Neck: Normal range of motion. Neck supple.  Cardiovascular: Normal rate, regular rhythm, normal heart sounds and intact distal pulses.  Pulmonary/Chest: Effort normal and breath sounds normal.  Abdominal: Soft. Bowel sounds are normal.  Musculoskeletal: Normal range of motion.  Neurological: She is alert and oriented to person, place, and time.  Skin: Skin is warm and dry. Capillary refill takes less than 2 seconds.  Psychiatric: She has a normal mood and affect. Her behavior is normal. Judgment and thought content normal.  Nursing note and vitals reviewed.  Assessment:   1. Hospital discharge follow-up 2. Screening for blood or protein in urine 3. Thrombocytopenia ([place]) 4. Polysubstance abuse ([place]) 5. Follow up  Plan:   1. Hospital discharge follow-up - CBC with Differential - Comprehensive metabolic panel; Future - Comprehensive metabolic panel  2. Screening for blood or protein in urine Urinalysis is negative today.   3. Thrombocytopenia ([place]) Probable r/t alcohol abuse and Hep C. Platelets 19.0 on 02/02/2018. She reports no signs and symptoms of bleeding. We will repeat CBC to re-evaluate Platelets level today. Bleeding precautions reviewed with patient. She will report to office or ED if symptoms appear.  - Urinalysis Dipstick  4. Polysubstance abuse ([place]) Positive for cocaine and benzos on 02/02/2018. Denies use of illegal drugs.   5. Alcohol abuse She has decrease alcohol consumption.    6. Chronic Hepatitis C She will continue routine follow ups with Infection disease. No active treatment at this time.   7. Follow up She will follow up with [place] in 2 weeks.  Refer to Hematology.    No orders of the defined types were placed in this encounter.  Kathe Becton,  MSN, FNP-BC Patient [facility] [address] [place], [place]  [id number] [phone]

## 2018-02-06 ENCOUNTER — Telehealth: Payer: Self-pay | Admitting: Internal Medicine

## 2018-02-06 ENCOUNTER — Telehealth: Payer: Self-pay

## 2018-02-06 LAB — CBC WITH DIFFERENTIAL/PLATELET
Basophils Absolute: 0 10*3/uL (ref 0.0–0.2)
Basos: 1 %
EOS (ABSOLUTE): 0.1 10*3/uL (ref 0.0–0.4)
Eos: 5 %
Hematocrit: 38.8 % (ref 34.0–46.6)
Hemoglobin: 12 g/dL (ref 11.1–15.9)
Immature Grans (Abs): 0 10*3/uL (ref 0.0–0.1)
Immature Granulocytes: 0 %
Lymphocytes Absolute: 1.4 10*3/uL (ref 0.7–3.1)
Lymphs: 50 %
MCH: 28.2 pg (ref 26.6–33.0)
MCHC: 30.9 g/dL — ABNORMAL LOW (ref 31.5–35.7)
MCV: 91 fL (ref 79–97)
Monocytes Absolute: 0.3 10*3/uL (ref 0.1–0.9)
Monocytes: 11 %
Neutrophils Absolute: 0.9 10*3/uL — ABNORMAL LOW (ref 1.4–7.0)
Neutrophils: 33 %
Platelets: 19 10*3/uL — CL (ref 150–450)
RBC: 4.26 x10E6/uL (ref 3.77–5.28)
RDW: 19.6 % — ABNORMAL HIGH (ref 12.3–15.4)
WBC: 2.8 10*3/uL — ABNORMAL LOW (ref 3.4–10.8)

## 2018-02-06 LAB — COMPREHENSIVE METABOLIC PANEL
ALT: 17 IU/L (ref 0–32)
AST: 57 IU/L — ABNORMAL HIGH (ref 0–40)
Albumin/Globulin Ratio: 0.9 — ABNORMAL LOW (ref 1.2–2.2)
Albumin: 3.8 g/dL (ref 3.5–5.5)
Alkaline Phosphatase: 174 IU/L — ABNORMAL HIGH (ref 39–117)
BUN/Creatinine Ratio: 8 — ABNORMAL LOW (ref 9–23)
BUN: 5 mg/dL — ABNORMAL LOW (ref 6–24)
Bilirubin Total: 0.5 mg/dL (ref 0.0–1.2)
CO2: 19 mmol/L — ABNORMAL LOW (ref 20–29)
Calcium: 8.3 mg/dL — ABNORMAL LOW (ref 8.7–10.2)
Chloride: 102 mmol/L (ref 96–106)
Creatinine, Ser: 0.63 mg/dL (ref 0.57–1.00)
GFR calc Af Amer: 125 mL/min/{1.73_m2} (ref >59)
GFR calc non Af Amer: 109 mL/min/{1.73_m2} (ref >59)
Globulin, Total: 4.4 g/dL (ref 1.5–4.5)
Glucose: 99 mg/dL (ref 65–99)
Potassium: 3.8 mmol/L (ref 3.5–5.2)
Sodium: 141 mmol/L (ref 134–144)
Total Protein: 8.2 g/dL (ref 6.0–8.5)

## 2018-02-06 NOTE — Telephone Encounter (Signed)
Tried to contact patient again for infusion no answer

## 2018-02-06 NOTE — Telephone Encounter (Signed)
Received a call from [employer]; pt platelet count 19; repeated

## 2018-02-06 NOTE — Telephone Encounter (Signed)
Tried to contact patient again going to a vm.

## 2018-02-06 NOTE — Telephone Encounter (Signed)
Tried to contact patient at both numbers to have her come in for a infusion but there was no answer. I did leave a vm.

## 2018-02-06 NOTE — Telephone Encounter (Signed)
Have been unable to reach patient.  ?

## 2018-02-07 ENCOUNTER — Other Ambulatory Visit: Payer: Self-pay | Admitting: Family Medicine

## 2018-02-07 NOTE — Progress Notes (Signed)
Platelet level continues to be critically low at 19. Briana contacted several times unsuccessfully on 02/06/2018 to come in to receive Platelet infusion at Briana Sweeney. We also made referral to Hematology.

## 2018-02-07 NOTE — Patient Instructions (Signed)
Thrombocytopenia           Thrombocytopenia is a condition in which you have an abnormally decreased number of platelets in your blood. Platelets are also called thrombocytes. Platelets are needed for blood clotting. Some cases of thrombocytopenia are mild while others are more severe. What are the causes? This condition may be caused by:  Decreased production of platelets. This can be caused by: ? Aplastic anemia, in which your bone marrow quits making blood cells. ? Cancer in the bone marrow. ? Use of certain medicines, including chemotherapy. ? Infection in the bone marrow. ? Heavy alcohol consumption.  Increased destruction of platelets. This can be caused by: ? Certain immune diseases. ? Use of certain drugs. ? Certain blood clotting disorders. ? Certain inherited disorders. ? Certain bleeding disorders. ? Pregnancy.  Having an enlarged spleen (hypersplenism). In hypersplenism, the spleen gathers up platelets from circulation. This means that the platelets are not available to help with blood clotting. The spleen can be enlarged because of cirrhosis or other conditions.  What are the signs or symptoms? Symptoms of this condition are side effects of poor blood clotting. They will vary depending on how low the platelet counts are. Symptoms may include:  Abnormal bleeding.  Nosebleeds.  Heavy menstrual periods.  Blood in the urine or stool (feces).  A purplish discoloration in the skin (purpura).  Bruising.  A rash that looks like pinpoint, purplish-red spots (petechiae) on the skin and mucous membranes.  How is this diagnosed? This condition may be diagnosed with blood tests and a physical exam. Sometimes, a sample of bone marrow may be removed to look for the original cells (megakaryocytes) that make platelets. How is this treated? Treatment for this condition depends on the cause. Treatment options may include:  Treatment of another condition that is causing  the low platelet count.  Medicines to help protect your platelets from being destroyed.  A replacement (transfusion) of platelets to stop or prevent bleeding.  Surgery to remove the spleen.  Follow these instructions at home: General instructions  Check your skin and the linings inside your mouth for bruising or bleeding as told by your health care provider.  Check your sputum, urine, and stool for blood as told by your health care provider.  Ask your health care provider if it is okay for you to drink alcohol.  Take over-the-counter and prescription medicines only as told by your health care provider.  Tell all of your health care providers, including dentists and eye doctors, about your condition. Activity  Until your health care provider says it is okay. ? Do not return to any activities that could cause bumps or bruises.  Take extra care not to cut yourself when you shave or when you use scissors, needles, knives, and other tools.  Take extra care not to burn yourself when ironing or cooking. Contact a health care provider if:  You have unexplained bruising. Get help right away if:  You have active bleeding from anywhere on your body.  You have blood in your sputum, urine, or stool. This information is not intended to replace advice given to you by your health care provider. Make sure you discuss any questions you have with your health care provider. Document Released: 08/26/2005 Document Revised: 04/28/2016 Document Reviewed: 02/27/2015 Elsevier Interactive Patient Education  2018 [employer]  

## 2018-02-09 NOTE — Telephone Encounter (Signed)
Letter will be mailed out as I have been unable to reach patient by phone.

## 2018-02-10 ENCOUNTER — Encounter (HOSPITAL_COMMUNITY): Payer: Self-pay

## 2018-02-10 ENCOUNTER — Inpatient Hospital Stay (HOSPITAL_COMMUNITY)
Admission: EM | Admit: 2018-02-10 | Discharge: 2018-02-14 | DRG: 896 | Disposition: A | Discharge status: Home Health | Payer: Medicaid Other | Attending: Family Medicine | Admitting: Family Medicine

## 2018-02-10 ENCOUNTER — Emergency Department (HOSPITAL_COMMUNITY): Payer: Self-pay

## 2018-02-10 ENCOUNTER — Other Ambulatory Visit: Payer: Self-pay

## 2018-02-10 DIAGNOSIS — F10232 Alcohol dependence with withdrawal with perceptual disturbance: Secondary | ICD-10-CM

## 2018-02-10 DIAGNOSIS — D696 Thrombocytopenia, unspecified: Secondary | ICD-10-CM | POA: Diagnosis present

## 2018-02-10 DIAGNOSIS — K219 Gastro-esophageal reflux disease without esophagitis: Secondary | ICD-10-CM | POA: Diagnosis present

## 2018-02-10 DIAGNOSIS — F419 Anxiety disorder, unspecified: Secondary | ICD-10-CM | POA: Diagnosis present

## 2018-02-10 DIAGNOSIS — F10229 Alcohol dependence with intoxication, unspecified: Principal | ICD-10-CM | POA: Diagnosis present

## 2018-02-10 DIAGNOSIS — F1021 Alcohol dependence, in remission: Secondary | ICD-10-CM | POA: Diagnosis present

## 2018-02-10 DIAGNOSIS — G9340 Encephalopathy, unspecified: Secondary | ICD-10-CM | POA: Diagnosis present

## 2018-02-10 DIAGNOSIS — B1920 Unspecified viral hepatitis C without hepatic coma: Secondary | ICD-10-CM | POA: Diagnosis present

## 2018-02-10 DIAGNOSIS — G40909 Epilepsy, unspecified, not intractable, without status epilepticus: Secondary | ICD-10-CM | POA: Diagnosis present

## 2018-02-10 DIAGNOSIS — F209 Schizophrenia, unspecified: Secondary | ICD-10-CM | POA: Diagnosis present

## 2018-02-10 DIAGNOSIS — Z801 Family history of malignant neoplasm of trachea, bronchus and lung: Secondary | ICD-10-CM

## 2018-02-10 DIAGNOSIS — Z808 Family history of malignant neoplasm of other organs or systems: Secondary | ICD-10-CM

## 2018-02-10 DIAGNOSIS — F319 Bipolar disorder, unspecified: Secondary | ICD-10-CM | POA: Diagnosis present

## 2018-02-10 DIAGNOSIS — I8511 Secondary esophageal varices with bleeding: Secondary | ICD-10-CM | POA: Diagnosis present

## 2018-02-10 DIAGNOSIS — D61818 Other pancytopenia: Secondary | ICD-10-CM | POA: Diagnosis present

## 2018-02-10 DIAGNOSIS — Z87442 Personal history of urinary calculi: Secondary | ICD-10-CM

## 2018-02-10 DIAGNOSIS — K7031 Alcoholic cirrhosis of liver with ascites: Secondary | ICD-10-CM | POA: Diagnosis present

## 2018-02-10 DIAGNOSIS — K920 Hematemesis: Secondary | ICD-10-CM

## 2018-02-10 DIAGNOSIS — K3189 Other diseases of stomach and duodenum: Secondary | ICD-10-CM | POA: Diagnosis present

## 2018-02-10 DIAGNOSIS — K922 Gastrointestinal hemorrhage, unspecified: Secondary | ICD-10-CM

## 2018-02-10 DIAGNOSIS — E876 Hypokalemia: Secondary | ICD-10-CM | POA: Diagnosis not present

## 2018-02-10 DIAGNOSIS — K7011 Alcoholic hepatitis with ascites: Secondary | ICD-10-CM | POA: Diagnosis present

## 2018-02-10 DIAGNOSIS — F10939 Alcohol use, unspecified with withdrawal, unspecified: Secondary | ICD-10-CM

## 2018-02-10 DIAGNOSIS — K766 Portal hypertension: Secondary | ICD-10-CM | POA: Diagnosis present

## 2018-02-10 DIAGNOSIS — F10239 Alcohol dependence with withdrawal, unspecified: Secondary | ICD-10-CM | POA: Diagnosis present

## 2018-02-10 DIAGNOSIS — E871 Hypo-osmolality and hyponatremia: Secondary | ICD-10-CM | POA: Diagnosis not present

## 2018-02-10 DIAGNOSIS — W19XXXA Unspecified fall, initial encounter: Secondary | ICD-10-CM

## 2018-02-10 DIAGNOSIS — Z811 Family history of alcohol abuse and dependence: Secondary | ICD-10-CM

## 2018-02-10 HISTORY — DX: Gastrointestinal hemorrhage, unspecified: K92.2

## 2018-02-10 HISTORY — DX: Hematemesis: K92.0

## 2018-02-10 LAB — SALICYLATE LEVEL: Salicylate Lvl: <7 mg/dL (ref 2.8–30.0)

## 2018-02-10 LAB — URINALYSIS, ROUTINE W REFLEX MICROSCOPIC
Bilirubin Urine: NEGATIVE
Glucose, UA: NEGATIVE mg/dL
Ketones, ur: 20 mg/dL — AB
Leukocytes, UA: NEGATIVE
Nitrite: NEGATIVE
Protein, ur: NEGATIVE mg/dL
Specific Gravity, Urine: 1.01 (ref 1.005–1.030)
pH: 7 (ref 5.0–8.0)

## 2018-02-10 LAB — CBC WITH DIFFERENTIAL/PLATELET
Abs Immature Granulocytes: 0 10*3/uL (ref 0.0–0.1)
Basophils Absolute: 0 10*3/uL (ref 0.0–0.1)
Basophils Relative: 1 %
Eosinophils Absolute: 0 10*3/uL (ref 0.0–0.7)
Eosinophils Relative: 0 %
HCT: 35.2 % — ABNORMAL LOW (ref 36.0–46.0)
Hemoglobin: 11.2 g/dL — ABNORMAL LOW (ref 12.0–15.0)
Immature Granulocytes: 0 %
Lymphocytes Relative: 25 %
Lymphs Abs: 0.4 10*3/uL — ABNORMAL LOW (ref 0.7–4.0)
MCH: 28.2 pg (ref 26.0–34.0)
MCHC: 31.8 g/dL (ref 30.0–36.0)
MCV: 88.7 fL (ref 78.0–100.0)
Monocytes Absolute: 0.2 10*3/uL (ref 0.1–1.0)
Monocytes Relative: 14 %
Neutro Abs: 1 10*3/uL — ABNORMAL LOW (ref 1.7–7.7)
Neutrophils Relative %: 60 %
Platelets: 5 10*3/uL — CL (ref 150–400)
RBC: 3.97 MIL/uL (ref 3.87–5.11)
RDW: 19.3 % — ABNORMAL HIGH (ref 11.5–15.5)
WBC: 1.7 10*3/uL — ABNORMAL LOW (ref 4.0–10.5)

## 2018-02-10 LAB — TYPE AND SCREEN
ABO/RH(D): O POS
Antibody Screen: NEGATIVE

## 2018-02-10 LAB — COMPREHENSIVE METABOLIC PANEL
ALT: 24 U/L (ref 14–54)
AST: 73 U/L — ABNORMAL HIGH (ref 15–41)
Albumin: 3.4 g/dL — ABNORMAL LOW (ref 3.5–5.0)
Alkaline Phosphatase: 158 U/L — ABNORMAL HIGH (ref 38–126)
Anion gap: 12 (ref 5–15)
BUN: 5 mg/dL — ABNORMAL LOW (ref 6–20)
CO2: 22 mmol/L (ref 22–32)
Calcium: 9.2 mg/dL (ref 8.9–10.3)
Chloride: 107 mmol/L (ref 101–111)
Creatinine, Ser: 0.71 mg/dL (ref 0.44–1.00)
GFR calc Af Amer: >60 mL/min (ref >60)
GFR calc non Af Amer: >60 mL/min (ref >60)
Glucose, Bld: 133 mg/dL — ABNORMAL HIGH (ref 65–99)
Potassium: 3.7 mmol/L (ref 3.5–5.1)
Sodium: 141 mmol/L (ref 135–145)
Total Bilirubin: 2.3 mg/dL — ABNORMAL HIGH (ref 0.3–1.2)
Total Protein: 8.8 g/dL — ABNORMAL HIGH (ref 6.5–8.1)

## 2018-02-10 LAB — MAGNESIUM: Magnesium: 1.8 mg/dL (ref 1.7–2.4)

## 2018-02-10 LAB — RAPID URINE DRUG SCREEN, HOSP PERFORMED
Amphetamines: NOT DETECTED
Barbiturates: NOT DETECTED
Benzodiazepines: POSITIVE — AB
Cocaine: NOT DETECTED
Opiates: NOT DETECTED
Tetrahydrocannabinol: NOT DETECTED

## 2018-02-10 LAB — POC OCCULT BLOOD, ED: Fecal Occult Bld: NEGATIVE

## 2018-02-10 LAB — ETHANOL: Alcohol, Ethyl (B): <10 mg/dL (ref <10)

## 2018-02-10 LAB — I-STAT BETA HCG BLOOD, ED (MC, WL, AP ONLY): I-stat hCG, quantitative: <5 m[IU]/mL (ref <5)

## 2018-02-10 LAB — I-STAT TROPONIN, ED: Troponin i, poc: 0 ng/mL (ref 0.00–0.08)

## 2018-02-10 LAB — GLUCOSE, CAPILLARY: Glucose-Capillary: 106 mg/dL — ABNORMAL HIGH (ref 65–99)

## 2018-02-10 LAB — PROTIME-INR
INR: 1.2
Prothrombin Time: 15.1 seconds (ref 11.4–15.2)

## 2018-02-10 LAB — ACETAMINOPHEN LEVEL: Acetaminophen (Tylenol), Serum: <10 ug/mL — ABNORMAL LOW (ref 10–30)

## 2018-02-10 LAB — PHOSPHORUS: Phosphorus: 2.8 mg/dL (ref 2.5–4.6)

## 2018-02-10 MED ORDER — SODIUM CHLORIDE 0.9 % IV SOLN
1.0000 g | INTRAVENOUS | Status: DC
  Administered 2018-02-11 (×2): 1 g via INTRAVENOUS
  Filled 2018-02-10 (×4): qty 10

## 2018-02-10 MED ORDER — SODIUM CHLORIDE 0.9 % IV SOLN
8.0000 mg/h | INTRAVENOUS | Status: DC
  Filled 2018-02-10 (×2): qty 80

## 2018-02-10 MED ORDER — LORAZEPAM 1 MG PO TABS: 0.0000 mg | ORAL_TABLET | Freq: Four times a day (QID) | ORAL | Status: DC

## 2018-02-10 MED ORDER — PHENOBARBITAL SODIUM 130 MG/ML IJ SOLN
130.0000 mg | INTRAMUSCULAR | Status: DC | PRN
  Administered 2018-02-11: 130 mg via INTRAVENOUS
  Filled 2018-02-10 (×2): qty 1

## 2018-02-10 MED ORDER — PANTOPRAZOLE SODIUM 40 MG IV SOLR: 40.0000 mg | Freq: Two times a day (BID) | INTRAVENOUS | Status: DC

## 2018-02-10 MED ORDER — OCTREOTIDE LOAD VIA INFUSION
50.0000 ug | Freq: Once | INTRAVENOUS | Status: AC
  Administered 2018-02-10: 50 ug via INTRAVENOUS
  Filled 2018-02-10: qty 25

## 2018-02-10 MED ORDER — HALOPERIDOL LACTATE 5 MG/ML IJ SOLN
5.0000 mg | Freq: Four times a day (QID) | INTRAMUSCULAR | Status: DC | PRN
  Administered 2018-02-11 – 2018-02-13 (×4): 5 mg via INTRAVENOUS
  Filled 2018-02-10 (×4): qty 1

## 2018-02-10 MED ORDER — FUROSEMIDE 20 MG PO TABS: 20.0000 mg | ORAL_TABLET | Freq: Every day | ORAL | Status: DC

## 2018-02-10 MED ORDER — LORAZEPAM 2 MG/ML IJ SOLN
0.0000 mg | Freq: Four times a day (QID) | INTRAMUSCULAR | Status: DC
  Administered 2018-02-10: 4 mg via INTRAVENOUS
  Filled 2018-02-10: qty 2

## 2018-02-10 MED ORDER — TOPIRAMATE 25 MG PO TABS
50.0000 mg | ORAL_TABLET | Freq: Every day | ORAL | Status: DC
  Administered 2018-02-11 – 2018-02-14 (×4): 50 mg via ORAL
  Filled 2018-02-10 (×4): qty 2

## 2018-02-10 MED ORDER — LORAZEPAM 2 MG/ML IJ SOLN
2.0000 mg | INTRAMUSCULAR | Status: DC | PRN
  Administered 2018-02-10 (×5): 2 mg via INTRAVENOUS
  Filled 2018-02-10 (×5): qty 1

## 2018-02-10 MED ORDER — SODIUM CHLORIDE 0.9 % IV SOLN
50.0000 ug/h | INTRAVENOUS | Status: DC
  Administered 2018-02-10 – 2018-02-12 (×4): 50 ug/h via INTRAVENOUS
  Filled 2018-02-10 (×7): qty 1

## 2018-02-10 MED ORDER — BUSPIRONE HCL 10 MG PO TABS: 10.0000 mg | ORAL_TABLET | Freq: Two times a day (BID) | ORAL | Status: DC

## 2018-02-10 MED ORDER — ONDANSETRON HCL 4 MG/2ML IJ SOLN: 4.0000 mg | Freq: Four times a day (QID) | INTRAMUSCULAR | Status: DC | PRN

## 2018-02-10 MED ORDER — PHENOBARBITAL SODIUM 130 MG/ML IJ SOLN: 260.0000 mg | INTRAMUSCULAR | Status: DC | PRN

## 2018-02-10 MED ORDER — SODIUM CHLORIDE 0.9 % IV SOLN
Freq: Once | INTRAVENOUS | Status: AC
  Administered 2018-02-10: 17:00:00 via INTRAVENOUS

## 2018-02-10 MED ORDER — LORAZEPAM 2 MG/ML IJ SOLN: 2.0000 mg | INTRAMUSCULAR | Status: DC | PRN

## 2018-02-10 MED ORDER — SODIUM CHLORIDE 0.9 % IV SOLN
80.0000 mg | Freq: Once | INTRAVENOUS | Status: AC
  Administered 2018-02-10: 80 mg via INTRAVENOUS
  Filled 2018-02-10: qty 80

## 2018-02-10 MED ORDER — THIAMINE HCL 100 MG/ML IJ SOLN
Freq: Once | INTRAVENOUS | Status: AC
  Administered 2018-02-10: 16:00:00 via INTRAVENOUS
  Filled 2018-02-10: qty 1000

## 2018-02-10 MED ORDER — DEXMEDETOMIDINE HCL IN NACL 200 MCG/50ML IV SOLN
0.2000 ug/kg/h | INTRAVENOUS | Status: DC
  Filled 2018-02-10: qty 50

## 2018-02-10 MED ORDER — ONDANSETRON HCL 4 MG/2ML IJ SOLN
4.0000 mg | Freq: Once | INTRAMUSCULAR | Status: AC
  Administered 2018-02-10: 4 mg via INTRAVENOUS
  Filled 2018-02-10: qty 2

## 2018-02-10 MED ORDER — LORAZEPAM 1 MG PO TABS: 0.0000 mg | ORAL_TABLET | Freq: Two times a day (BID) | ORAL | Status: DC

## 2018-02-10 MED ORDER — LORAZEPAM 2 MG/ML IJ SOLN: 0.0000 mg | Freq: Two times a day (BID) | INTRAMUSCULAR | Status: DC

## 2018-02-10 MED ORDER — FLUOXETINE HCL 20 MG PO CAPS
20.0000 mg | ORAL_CAPSULE | Freq: Every day | ORAL | Status: DC
  Filled 2018-02-10: qty 1

## 2018-02-10 MED ORDER — SODIUM CHLORIDE 0.9 % IV SOLN
8.0000 mg/h | INTRAVENOUS | Status: DC
  Administered 2018-02-10: 8 mg/h via INTRAVENOUS
  Filled 2018-02-10 (×3): qty 80

## 2018-02-10 MED ORDER — SODIUM CHLORIDE 0.9 % IV SOLN
80.0000 mg | Freq: Once | INTRAVENOUS | Status: DC
  Filled 2018-02-10: qty 80

## 2018-02-10 MED ORDER — GABAPENTIN 100 MG PO CAPS: 200.0000 mg | ORAL_CAPSULE | Freq: Three times a day (TID) | ORAL | Status: DC

## 2018-02-10 MED ORDER — PHENYTOIN SODIUM 50 MG/ML IJ SOLN
100.0000 mg | Freq: Three times a day (TID) | INTRAMUSCULAR | Status: DC
  Administered 2018-02-11 – 2018-02-13 (×8): 100 mg via INTRAVENOUS
  Filled 2018-02-10 (×9): qty 2

## 2018-02-10 MED ORDER — LORAZEPAM 2 MG/ML IJ SOLN: 1.0000 mg | INTRAMUSCULAR | Status: DC | PRN

## 2018-02-10 MED ORDER — TRAMADOL HCL 50 MG PO TABS: 50.0000 mg | ORAL_TABLET | Freq: Four times a day (QID) | ORAL | Status: DC | PRN

## 2018-02-10 MED ORDER — IPRATROPIUM-ALBUTEROL 0.5-2.5 (3) MG/3ML IN SOLN
3.0000 mL | Freq: Four times a day (QID) | RESPIRATORY_TRACT | Status: DC | PRN
Start: 2018-02-10 — End: 2018-02-14

## 2018-02-10 MED ORDER — TRAZODONE HCL 50 MG PO TABS
50.0000 mg | ORAL_TABLET | Freq: Every evening | ORAL | Status: DC | PRN
Start: 2018-02-10 — End: 2018-02-10

## 2018-02-10 MED ORDER — LORAZEPAM 2 MG/ML IJ SOLN
0.0000 mg | Freq: Four times a day (QID) | INTRAMUSCULAR | Status: DC
  Administered 2018-02-10: 2 mg via INTRAVENOUS
  Filled 2018-02-10: qty 1

## 2018-02-10 MED ORDER — M.V.I. ADULT IV INJ
INJECTION | Freq: Once | INTRAVENOUS | Status: DC
  Filled 2018-02-10: qty 1000

## 2018-02-10 MED ORDER — ONDANSETRON HCL 4 MG PO TABS: 4.0000 mg | ORAL_TABLET | Freq: Four times a day (QID) | ORAL | Status: DC | PRN

## 2018-02-10 MED ORDER — METOCLOPRAMIDE HCL 5 MG/ML IJ SOLN
10.0000 mg | Freq: Once | INTRAMUSCULAR | Status: AC
  Administered 2018-02-10: 10 mg via INTRAVENOUS
  Filled 2018-02-10: qty 2

## 2018-02-10 MED ORDER — PHENYTOIN SODIUM EXTENDED 100 MG PO CAPS: 300.0000 mg | ORAL_CAPSULE | Freq: Every day | ORAL | Status: DC

## 2018-02-10 MED ORDER — SODIUM CHLORIDE 0.9 % IV SOLN
50.0000 ug/h | INTRAVENOUS | Status: DC
  Administered 2018-02-10: 50 ug/h via INTRAVENOUS
  Filled 2018-02-10 (×2): qty 1

## 2018-02-10 MED ORDER — HYDROXYZINE HCL 25 MG PO TABS: 25.0000 mg | ORAL_TABLET | Freq: Three times a day (TID) | ORAL | Status: DC | PRN

## 2018-02-10 MED ORDER — PANTOPRAZOLE SODIUM 40 MG PO TBEC: 40.0000 mg | DELAYED_RELEASE_TABLET | Freq: Every day | ORAL | Status: DC

## 2018-02-10 MED ORDER — HALOPERIDOL 1 MG PO TABS: 2.0000 mg | ORAL_TABLET | Freq: Every day | ORAL | Status: DC

## 2018-02-10 MED ORDER — SODIUM CHLORIDE 0.9 % IV SOLN
INTRAVENOUS | Status: DC
  Administered 2018-02-10: 22:00:00 via INTRAVENOUS

## 2018-02-10 MED ORDER — ADULT MULTIVITAMIN W/MINERALS CH: 1.0000 | ORAL_TABLET | Freq: Every day | ORAL | Status: DC

## 2018-02-10 NOTE — ED Notes (Signed)
Pt vomited small amount of blood. PA aware. Will continue to monitor.

## 2018-02-10 NOTE — ED Provider Notes (Signed)
[facility] EMERGENCY DEPARTMENT Provider Note   CSN: [id number] Arrival date & time: 02/10/18  1420     History   Chief Complaint Chief Complaint  Patient presents with  . Alcohol Problem  . Tremors    HPI Briana Sweeney is a 47 y.o. female with a history of alcohol use disorder, seizures, pancytopenia, alcoholic cirrhosis, seizures secondary to alcohol withdrawal, and  esophageal varices seen on EGD 12/18 who presents to the emergency department with a chief complaint of alcohol withdrawal.  The patient reports that she consumes 1/5 or half gallon of vodka daily.  She will also supplement her vodka with several beers.  Last alcohol intake was 3 beers yesterday morning.  She has a history of seizures secondary to withdrawal from EtOH.   She reports that she awoke suddenly at 4:30 this morning with emesis that looked consistent with coffee grounds.  She reports that she had multiple episodes at home, but has only had one episode since arrival to the ED.  After reviewing the patient's medical record, it appears that she had an EGD performed in December 2018 by Dr. Loletha Carrow which showed non-bleeding esophageal varices, but was otherwise negative.  The patient reports that she also had approximately 6-7 falls yesterday and notes large bruises all over her body.  She denies any pain at this time.  She is unable to provide any additional details about the falls because she cannot remember them. She states " I might of had a seizure, but it is hard to say.  I have not been able to keep any my home medications down this morning."  She also reports epistaxis for the last week that resolved yesterday.  She states "I think my platelets are low."  Patient's last admission was May 7-11, 2019.  Platelet count was 19K at discharge.  She was advised to follow-up with her PCP.  She is unable to state if she is followed up, but just keeps repeating my liver only functions at 20% because of my  cirrhosis."   She denies fever, chills, melena, hematochezia, abdominal pain, dizziness, lightheadedness, chest pain, dyspnea, sore throat.   She is a poor historian.  She denies SI, HI, or auditory or visual hallucinations at this time.  The history is provided by the patient. No language interpreter was used.    Past Medical History:  Diagnosis Date  . Anxiety   . Bipolar affective disorder ([place])    With anxiety features  . Cirrhosis of liver ([place])    Due to alcohol and hepatitis C  . Depression   . GERD (gastroesophageal reflux disease)   . Hepatitis C 2018   hepatitis c and alcohol related hepatitis  . History of blood transfusion    "blood doesn't clot; I fell down and had to have a transfusion"  . History of kidney stones   . Migraine    "when I get really stressed" (09/01/2017)  . Schizophrenia ([place])   . Seizures ([place])    "when I run out of my RX; lots recently" (09/01/2017)    Patient Active Problem List   Diagnosis Date Noted  . Upper GI bleed 02/10/2018  . Alcohol withdrawal ([place]) 02/10/2018  . Chronic anemia   . Thrombocytopenia due to drugs   . Suicidal ideation   . Thrombocytopenia ([place]) 01/02/2018  . GERD (gastroesophageal reflux disease) 11/01/2017  . Trichimoniasis 10/30/2017  . Alcohol dependence ([place]) 10/29/2017  . MDD (major depressive disorder), severe ([place]) 10/28/2017  .  Acute hyperactive alcohol withdrawal delirium ([place]) 10/09/2017  . Schizophrenia ([place]) 10/09/2017  . Alcohol abuse with alcohol-induced mood disorder ([place])   . Alcohol withdrawal syndrome with complication ([place]) 03/02/7627  . Esophageal varices without bleeding ([place])   . Hematemesis 09/01/2017  . Ascites due to alcoholic cirrhosis ([place])   . Decompensated hepatic cirrhosis ([place]) 07/23/2017  . Alcohol abuse 07/23/2017  . Hepatitis C antibody positive in blood 07/23/2017  . Hypokalemia 07/23/2017  . Jaundice 07/23/2017  . Coagulopathy ([place]) 07/23/2017  . Hypomagnesemia  07/23/2017  . Pancytopenia ([place]) 07/23/2017  . Alcoholic cirrhosis of liver with ascites ([place]) 07/23/2017    Past Surgical History:  Procedure Laterality Date  . ESOPHAGOGASTRODUODENOSCOPY N/A 09/03/2017   Procedure: ESOPHAGOGASTRODUODENOSCOPY (EGD);  Surgeon: Doran Stabler, MD;  Location: [facility];  Service: Gastroenterology;  Laterality: N/A;  . FINGER FRACTURE SURGERY Left    "shattered my pinky"  . FRACTURE SURGERY    . IR PARACENTESIS  07/23/2017  . IR PARACENTESIS  07/2017   "did it twice in the same week" (09/01/2017)  . SHOULDER OPEN ROTATOR CUFF REPAIR Right   . TUBAL LIGATION    . VAGINAL HYSTERECTOMY       OB History   None      Home Medications    Prior to Admission medications   Medication Sig Start Date End Date Taking? Authorizing Provider  busPIRone (BUSPAR) 10 MG tablet Take 1 tablet (10 mg total) by mouth 2 (two) times daily. 02/05/18  Yes [name], FNP  chlordiazePOXIDE (LIBRIUM) 10 MG capsule Take 10 mg by mouth 3 (three) times daily as needed for anxiety.   Yes [provider]  diazepam (VALIUM) 5 MG tablet Take 5 mg by mouth daily.   Yes [provider]  famotidine (PEPCID) 20 MG tablet Take 1 tablet (20 mg total) by mouth 2 (two) times daily. 01/08/18  Yes Eugenie Filler, MD  FLUoxetine (PROZAC) 20 MG capsule Take 1 capsule (20 mg total) by mouth daily. For mood control 01/08/18  Yes Eugenie Filler, MD  furosemide (LASIX) 20 MG tablet Take 1 tablet (20 mg total) by mouth daily. 01/08/18  Yes Eugenie Filler, MD  gabapentin (NEURONTIN) 100 MG capsule Take 2 capsules (200 mg total) by mouth 3 (three) times daily. 01/08/18  Yes Eugenie Filler, MD  haloperidol (HALDOL) 2 MG tablet Take 1 tablet (2 mg total) by mouth at bedtime. 01/08/18  Yes Eugenie Filler, MD  hydrOXYzine (ATARAX/VISTARIL) 25 MG tablet Take 1 tablet (25 mg total) by mouth 3 (three) times daily as needed for anxiety. 02/05/18  Yes [name],  FNP  Multiple Vitamin (MULTIVITAMIN WITH MINERALS) TABS tablet Take 1 tablet by mouth daily. 01/09/18  Yes Eugenie Filler, MD  phenytoin (DILANTIN) 100 MG ER capsule Take 300 mg by mouth at bedtime.   Yes [provider]  topiramate (TOPAMAX) 50 MG tablet Take 1 tablet (50 mg total) by mouth daily. 02/05/18  Yes [name], FNP  traMADol (ULTRAM) 50 MG tablet Take 1 tablet (50 mg total) by mouth every 6 (six) hours as needed for moderate pain. 01/08/18  Yes Eugenie Filler, MD  traZODone (DESYREL) 50 MG tablet Take 1 tablet (50 mg total) by mouth at bedtime as needed for sleep. 02/05/18  Yes [name], FNP  folic acid (FOLVITE) 1 MG tablet Take 1 tablet (1 mg total) by mouth daily. Patient not taking: Reported on 02/10/2018 01/09/18  Eugenie Filler, MD  lactulose (CHRONULAC) 10 GM/15ML solution Take 30 mLs (20 g total) by mouth daily. Patient not taking: Reported on 02/10/2018 01/09/18   Eugenie Filler, MD  spironolactone (ALDACTONE) 50 MG tablet Take 1 tablet (50 mg total) by mouth daily. Patient not taking: Reported on 02/10/2018 01/08/18   Eugenie Filler, MD  thiamine 100 MG tablet Take 1 tablet (100 mg total) by mouth daily. Patient not taking: Reported on 02/10/2018 01/09/18   Eugenie Filler, MD    Family History Family History  Problem Relation Age of Onset  . Lung cancer Mother 60  . Alcohol abuse Mother   . Throat cancer Father 27    Social History Social History   Tobacco Use  . Smoking status: Never Smoker  . Smokeless tobacco: Never Used  Substance Use Topics  . Alcohol use: Yes    Alcohol/week: 37.8 oz    Types: 63 Cans of beer per week    Comment: weekly "I have cut back"  . Drug use: No    Allergies   Patient has no known allergies.   Review of Systems Review of Systems  Constitutional: Negative for activity change, chills and fever.  Respiratory: Negative for shortness of breath.   Cardiovascular: Negative for chest pain.    Gastrointestinal: Positive for abdominal distention and vomiting (hematemesis). Negative for abdominal pain, anal bleeding, blood in stool, diarrhea and nausea.  Genitourinary: Negative for dysuria and hematuria.  Musculoskeletal: Negative for back pain and neck pain.  Skin: Negative for rash.  Allergic/Immunologic: Negative for immunocompromised state.  Neurological: Positive for tremors. Negative for headaches.  Hematological: Bruises/bleeds easily.  Psychiatric/Behavioral: Negative for confusion and hallucinations.     Physical Exam Updated Vital Signs BP 121/84   Pulse (!) 109   Temp 98.1 F (36.7 C) (Oral)   Resp (!) 25   Ht 5\' 2"  (1.575 m)   Wt 59 kg (130 lb)   LMP  (LMP Unknown)   SpO2 93%   BMI 23.78 kg/m   Physical Exam  Constitutional: No distress.  HENT:  Head: Normocephalic and atraumatic. Head is without raccoon's eyes, without Battle's sign, without abrasion and without laceration.  Right Ear: No hemotympanum.  Left Ear: No hemotympanum.  Nose: No nasal septal hematoma. No epistaxis.  Mouth/Throat: Uvula is midline, oropharynx is clear and moist and mucous membranes are normal. No tonsillar exudate.  No hematoma noted to the scalp.  No blood noted in the posterior oropharynx.  Eyes: Pupils are equal, round, and reactive to light. Conjunctivae and EOM are normal.  Neck: Neck supple.  Cardiovascular: Regular rhythm. Tachycardia present. Exam reveals no gallop and no friction rub.  No murmur heard. Pulmonary/Chest: Effort normal and breath sounds normal. No stridor. Tachypnea noted. No respiratory distress. She has no wheezes. She has no rales. She exhibits no tenderness.  Abdominal: Soft. She exhibits no distension and no mass. There is no tenderness. There is no rebound and no guarding. No hernia.  Musculoskeletal: She exhibits no tenderness or deformity.  No tenderness to the spinous processes of the cervical, thoracic, or lumbar spine or surrounding bilateral  paraspinal muscles.  No focal tenderness to the bilateral shoulders, elbows, wrist, hips, knees, ankles.  No crepitus, step-offs, or obvious deformities.  Neurological: She is alert.  Speaks in complete fluent sentences.  Cranial nerves II through XII are grossly intact.  Alert and oriented x3.  5 out of 5 strength against resistance of the bilateral upper and  lower extremity's.  Sensation is intact throughout.  Skin: Skin is warm. Capillary refill takes less than 2 seconds. No rash noted.  Large areas of ecchymosis are noted to the back, buttocks, legs, abdomen, and arms.  See pictures below.  Psychiatric: Her behavior is normal.  Nursing note and vitals reviewed.  Left thigh      Bilateral knees    Right buttock     Left shoulder    Right breast    Left forearm    ED Treatments / Results  Labs (all labs ordered are listed, but only abnormal results are displayed) Labs Reviewed  COMPREHENSIVE METABOLIC PANEL - Abnormal; Notable for the following components:      Result Value   Glucose, Bld 133 (*)    BUN 5 (*)    Total Protein 8.8 (*)    Albumin 3.4 (*)    AST 73 (*)    Alkaline Phosphatase 158 (*)    Total Bilirubin 2.3 (*)    All other components within normal limits  RAPID URINE DRUG SCREEN, HOSP PERFORMED - Abnormal; Notable for the following components:   Benzodiazepines POSITIVE (*)    All other components within normal limits  CBC WITH DIFFERENTIAL/PLATELET - Abnormal; Notable for the following components:   WBC 1.7 (*)    Hemoglobin 11.2 (*)    HCT 35.2 (*)    RDW 19.3 (*)    Platelets 5 (*)    Neutro Abs 1.0 (*)    Lymphs Abs 0.4 (*)    All other components within normal limits  URINALYSIS, ROUTINE W REFLEX MICROSCOPIC - Abnormal; Notable for the following components:   Hgb urine dipstick SMALL (*)    Ketones, ur 20 (*)    Bacteria, UA RARE (*)    All other components within normal limits  ACETAMINOPHEN LEVEL - Abnormal; Notable for the following  components:   Acetaminophen (Tylenol), Serum <10 (*)    All other components within normal limits  ETHANOL  SALICYLATE LEVEL  PROTIME-INR  MAGNESIUM  PHOSPHORUS  BASIC METABOLIC PANEL  CBC  CBC  MAGNESIUM  PHOSPHORUS  I-STAT TROPONIN, ED  I-STAT BETA HCG BLOOD, ED ([facility], [facility], [facility] ONLY)  POC OCCULT BLOOD, ED  TYPE AND SCREEN  PREPARE PLATELET PHERESIS    EKG EKG Interpretation  Date/Time:  Tuesday February 10 2018 19:32:58 EDT Ventricular Rate:  99 PR Interval:    QRS Duration: 93 QT Interval:  369 QTC Calculation: 474 R Axis:   59 Text Interpretation:  Sinus rhythm Low voltage, precordial leads Borderline T abnormalities, anterior leads similar to previous Confirmed by Theotis Burrow [phone]) on 02/10/2018 10:44:03 PM   Radiology Dg Chest 2 View  Result Date: 02/10/2018 CLINICAL DATA:  Dyspnea. Shortness of breath with nausea and vomiting for 1 day. EXAM: CHEST - 2 VIEW COMPARISON:  Radiograph 10/22/2017, chest CT 10/25/2017 FINDINGS: The cardiomediastinal contours are normal. Sequela of prior granulomatous disease with calcified granuloma in the left lower lobe and calcified left hilar lymph nodes, unchanged. Pulmonary vasculature is normal. No consolidation, pleural effusion, or pneumothorax. No acute osseous abnormalities are seen. IMPRESSION: 1. No acute findings. 2. Prior granulomatous disease. Electronically Signed   By: Jeb Levering M.D.   On: 02/10/2018 19:24   Ct Head Wo Contrast  Result Date: 02/10/2018 CLINICAL DATA:  Multiple falls since yesterday. EXAM: CT HEAD WITHOUT CONTRAST TECHNIQUE: Contiguous axial images were obtained from the base of the skull through the vertex without intravenous contrast. COMPARISON:  01/13/2018 FINDINGS:  Brain: No evidence of acute infarction, hemorrhage, hydrocephalus, extra-axial collection or mass lesion/mass effect. Vascular: No hyperdense vessel or unexpected calcification. Skull: Normal. Negative for fracture or focal lesion.  Sinuses/Orbits: No acute finding. Other: Left ear metallic object causes streak artifact through the cerebellum and brainstem. IMPRESSION: No acute intracranial abnormality. Electronically Signed   By: Fidela Salisbury M.D.   On: 02/10/2018 19:11    Procedures .Critical Care Performed by: Joanne Gavel, PA-C Authorized by: Joanne Gavel, PA-C   Critical care provider statement:    Critical care time (minutes):  55   Critical care time was exclusive of:  Separately billable procedures and treating other patients and teaching time   Critical care was necessary to treat or prevent imminent or life-threatening deterioration of the following conditions:  Circulatory failure   Critical care was time spent personally by me on the following activities:  Re-evaluation of patient's condition, ordering and performing treatments and interventions, discussions with consultants, ordering and review of laboratory studies, ordering and review of radiographic studies, review of old charts, obtaining history from patient or surrogate, examination of patient and evaluation of patient's response to treatment   (including critical care time)  Medications Ordered in ED Medications  topiramate (TOPAMAX) tablet 50 mg (50 mg Oral Not Given 02/10/18 2054)  ondansetron (ZOFRAN) tablet 4 mg (has no administration in time range)    Or  ondansetron (ZOFRAN) injection 4 mg (has no administration in time range)  ipratropium-albuterol (DUONEB) 0.5-2.5 (3) MG/3ML nebulizer solution 3 mL (has no administration in time range)  sodium chloride 0.9 % 1,000 mL with thiamine 696 mg, folic acid 1 mg, multivitamins adult 10 mL infusion ( Intravenous Hold 02/10/18 2146)  pantoprazole (PROTONIX) 80 mg in sodium chloride 0.9 % 250 mL (0.32 mg/mL) infusion (8 mg/hr Intravenous Transfusing/Transfer 02/10/18 2252)  pantoprazole (PROTONIX) injection 40 mg (has no administration in time range)  dexmedetomidine (PRECEDEX) 200 MCG/50ML (4  mcg/mL) infusion (has no administration in time range)  octreotide (SANDOSTATIN) 500 mcg in sodium chloride 0.9 % 250 mL (2 mcg/mL) infusion (50 mcg/hr Intravenous Transfusing/Transfer 02/10/18 2252)  phenytoin (DILANTIN) injection 100 mg (has no administration in time range)  cefTRIAXone (ROCEPHIN) 1 g in sodium chloride 0.9 % 100 mL IVPB (has no administration in time range)  PHENObarbital (LUMINAL) injection 130 mg (has no administration in time range)  PHENObarbital (LUMINAL) injection 260 mg (has no administration in time range)  haloperidol lactate (HALDOL) injection 5 mg (has no administration in time range)  sodium chloride 0.9 % 1,000 mL with thiamine 295 mg, folic acid 1 mg, multivitamins adult 10 mL infusion ( Intravenous Transfusing/Transfer 02/10/18 2252)  0.9 %  sodium chloride infusion ( Intravenous Stopped 02/10/18 2043)  ondansetron (ZOFRAN) injection 4 mg (4 mg Intravenous Given 02/10/18 1949)  pantoprazole (PROTONIX) 80 mg in sodium chloride 0.9 % 100 mL IVPB (0 mg Intravenous Stopped 02/10/18 2104)  metoCLOPramide (REGLAN) injection 10 mg (10 mg Intravenous Given 02/10/18 2134)  octreotide (SANDOSTATIN) 2 mcg/mL load via infusion 50 mcg (50 mcg Intravenous Bolus from Bag 02/10/18 2141)     Initial Impression / Assessment and Plan / ED Course  I have reviewed the triage vital signs and the nursing notes.  Pertinent labs & imaging results that were available during my care of the patient were reviewed by me and considered in my medical decision making (see chart for details).  Clinical Course as of Feb 11 2303  Tue Feb 10, 2018  2111 Patient recheck.  The patient was given Zofran within the last 30 minutes.  Upon arrival into the room, the patient was actively vomiting bright red blood.  Dried blood was noted on the patient's lips.  Octreotide and Protonix infusion and bolus ordered.  Consult to gastroenterology and spoke with Dr. Ardis Hughs who is in agreement with the plan.  GI will plan to  see the patient in the morning, but would be happy to be reconsulted overnight if the patient's status clinically worsens.   [MM]  2122 Patient recheck.  She appears to have further clinical decompensation.  The patient was reevaluated with Dr. Alvino Chapel, attending physician.  Nursing staff reports that the patient is now hallucinating.  The patient reports that she is seeing smoke and other colors on the ceiling.  She states that the year is 52.  The month is July.  She is oriented to name and place.   [MM]    Clinical Course User Index [MM] Joanne Gavel, PA-C    47 year old female with a history of alcohol use disorder, seizures, pancytopenia, alcoholic cirrhosis, seizures secondary to alcohol withdrawal, and  esophageal varices seen on EGD 12/18 who presents to the emergency department with a chief complaint of alcohol withdrawal, falls, recurrent epistaxis over the last week, and coffee-ground emesis since this AM.   Patient tachycardic, tachypneic, with a tremor on arrival to the ED.  CIWA score 29.  The patient was seen and evaluated by Dr. Alvino Chapel, attending physician.  2 mg of Ativan given and PRN Ativan orders were placed for nursing staff.  Tachycardia, tachypnea, and tremor improved.  Ethanol level is less than 10.  UDS positive for benzos.  CBC with platelet count of 5, down from 19 on 01/17/18.  Platelet transfusion ordered given concern for hematemesis based on patient's history.  She has had no episodes of hematemesis since arrival to the ED. WBC 1.7 and Hgb 11.2, down from 12.   Chest x-ray is negative for acute pathology.  CT head is negative.  On exam, she has several large areas of contusion and ecchymosis as noted above on the physical exam, but no focal tenderness.  The patient was previously seen by hematology (Dr. Marin Olp) on 01/03/28 who felt that alcohol toxicity was her biggest problem and that she was suffering marrow toxicity from alcohol use.  She was previously  seen by Dr. Loletha Carrow with GI on 08/22/17 when she had self-limited hematemesis in the setting of protracted vomiting from alcohol abuse.  EGD performed with esophageal varices, but no active bleeding.  Patient was initially admitted to stepdown unit by hospitalist Dr. Aurelio Brash; however, after the patient was accepted for admission, patient began to have worsening tremor, tachycardia, and began to have visual hallucinations.  On recheck, she was only oriented to person and place.  Given worsening symptoms despite benzo spoke with PA arrival with the critical care team, who will admit the patient.  Also spoke with Dr. Ardis Hughs with GI regarding the patient's active hematemesis in the ED.  Octreotide and IV Protonix ordered.  GI will plan to see the patient in the morning, but will see the patient sooner if her clinical status significantly changes.  Appreciate consult from GI. The patient appears reasonably stabilized for admission considering the current resources, flow, and capabilities available in the ED at this time, and I doubt any other [facility] requiring further screening and/or treatment in the ED prior to admission.  Final Clinical Impressions(s) / ED Diagnoses   Final diagnoses:  Alcohol withdrawal, with unspecified complication ([place])  Pancytopenia ([place])  Fall, initial encounter  Upper GI bleed    ED Discharge Orders    None       Joanne Gavel, PA-C 02/10/18 2323    Davonna Belling, MD 02/11/18 2157

## 2018-02-10 NOTE — ED Triage Notes (Signed)
Pt arrives to ED from home with multiple complaints. EMS reports pt is chronic alcohol user, last drink yesterday. Pt has related tremors upon arrival, which the pt states she gets when her platelets are low. Pt also has hx of seizures, takes medication. Pt had multiple falls yesterday and presents with bruising to her left lower back and right shoulder blade, unsure if she lost consciousness or hit her head with the falls. Pt alert and oriented upon arrival, complaining of headache, nausea, tremors, and thirst. Pt placed in position of comfort with bed locked and lowered, call bell in reach.

## 2018-02-10 NOTE — Consult Note (Addendum)
PULMONARY / CRITICAL CARE MEDICINE   Name: Briana Sweeney MRN: [id number] DOB: [birth year]    ADMISSION DATE:  02/10/2018 CONSULTATION DATE:  02/10/18  REFERRING MD:  Hugelmeyer - EDP  CHIEF COMPLAINT:  EtOH withdrawal  HISTORY OF PRESENT ILLNESS:  Pt is encephelopathic; therefore, this HPI is obtained from chart review. Briana Sweeney is a 47 y.o. female with PMH as outlined below including EtOH abuse, polysubstance abuse, cirrhosis, HCV, esophageal varices (confirmed on EGD 12/18), depression, anxiety, bipolar affective disorder, schizophrenia, seizures. She presented to [facility] ED 6/4 with tremors, concerns for EtOH withdrawal, and hematemesis that began earlier that day per boyfriend.  Last drink was 1 day prior.  She is a heavy drinker, multiple liquor drinks a day, at least 1/5.  She also drinks beer and mixes liquor into the beer.  Boyfriend states she woke up around 430am that morning vomiting coffee ground emesis.  Later had 1 episode of bright red blood tinged sputum.  She also had several mechanical falls the day prior and hit her shoulder / back.  Denies any loss of consciousness.    She had CT of the head in ED which was negative.  She was started on CIWA and despite multiple rounds of ativan, she remained agitated and tremulous.  PCCM was subsequently called for consideration of precedex and ICU admission.  PAST MEDICAL HISTORY :  She  has a past medical history of Anxiety, Bipolar affective disorder ([place]), Cirrhosis of liver ([place]), Depression, GERD (gastroesophageal reflux disease), Hepatitis C (2018), History of blood transfusion, History of kidney stones, Migraine, Schizophrenia ([place]), and Seizures ([place]).  PAST SURGICAL HISTORY: She  has a past surgical history that includes IR Paracentesis (07/23/2017); Tubal ligation; Finger fracture surgery (Left); Fracture surgery; Shoulder open rotator cuff repair (Right); Vaginal hysterectomy; IR Paracentesis (07/2017); and Esophagogastroduodenoscopy  (N/A, 09/03/2017).  No Known Allergies  No current facility-administered medications on file prior to encounter.    Current Outpatient Medications on File Prior to Encounter  Medication Sig  . busPIRone (BUSPAR) 10 MG tablet Take 1 tablet (10 mg total) by mouth 2 (two) times daily.  . chlordiazePOXIDE (LIBRIUM) 10 MG capsule Take 10 mg by mouth 3 (three) times daily as needed for anxiety.  . diazepam (VALIUM) 5 MG tablet Take 5 mg by mouth daily.  . famotidine (PEPCID) 20 MG tablet Take 1 tablet (20 mg total) by mouth 2 (two) times daily.  [name] Kitchen FLUoxetine (PROZAC) 20 MG capsule Take 1 capsule (20 mg total) by mouth daily. For mood control  . furosemide (LASIX) 20 MG tablet Take 1 tablet (20 mg total) by mouth daily.  [name] Kitchen gabapentin (NEURONTIN) 100 MG capsule Take 2 capsules (200 mg total) by mouth 3 (three) times daily.  . haloperidol (HALDOL) 2 MG tablet Take 1 tablet (2 mg total) by mouth at bedtime.  . hydrOXYzine (ATARAX/VISTARIL) 25 MG tablet Take 1 tablet (25 mg total) by mouth 3 (three) times daily as needed for anxiety.  . Multiple Vitamin (MULTIVITAMIN WITH MINERALS) TABS tablet Take 1 tablet by mouth daily.  . phenytoin (DILANTIN) 100 MG ER capsule Take 300 mg by mouth at bedtime.  . topiramate (TOPAMAX) 50 MG tablet Take 1 tablet (50 mg total) by mouth daily.  . traMADol (ULTRAM) 50 MG tablet Take 1 tablet (50 mg total) by mouth every 6 (six) hours as needed for moderate pain.  . traZODone (DESYREL) 50 MG tablet Take 1 tablet (50 mg total) by mouth at bedtime as needed for sleep.  [name] Kitchen  folic acid (FOLVITE) 1 MG tablet Take 1 tablet (1 mg total) by mouth daily. (Patient not taking: Reported on 02/10/2018)  . lactulose (CHRONULAC) 10 GM/15ML solution Take 30 mLs (20 g total) by mouth daily. (Patient not taking: Reported on 02/10/2018)  . spironolactone (ALDACTONE) 50 MG tablet Take 1 tablet (50 mg total) by mouth daily. (Patient not taking: Reported on 02/10/2018)  . thiamine 100 MG tablet Take 1  tablet (100 mg total) by mouth daily. (Patient not taking: Reported on 02/10/2018)    FAMILY HISTORY:  Her indicated that her mother is deceased. She indicated that her father is deceased.   SOCIAL HISTORY: She  reports that she has never smoked. She has never used smokeless tobacco. She reports that she drinks about 37.8 oz of alcohol per week. She reports that she does not use drugs.  REVIEW OF SYSTEMS:  Unable to obtain as pt is encephalopathic.  SUBJECTIVE:  Awake but confused.  Thinks it is 20 and that she is at home.  VITAL SIGNS: BP (!) 153/91   Pulse (!) 110   Temp 98.1 F (36.7 C) (Oral)   Resp 19   Ht 5\' 2"  (1.575 m)   Wt 59 kg (130 lb)   LMP  (LMP Unknown)   SpO2 98%   BMI 23.78 kg/m   HEMODYNAMICS:    VENTILATOR SETTINGS:    INTAKE / OUTPUT: I/O last 3 completed shifts: In: 550 [Blood:550] Out: -    PHYSICAL EXAMINATION: General: Adult female, tremulous, in NAD. Neuro: Awake.  Confused.  Thinks she is at home and that it is 21.  Tremulous with + asterixis. HEENT: [place]/AT. Sclerae anicteric, EOMI.  Dried blood to lips. Cardiovascular: Tachy, regular, no M/R/G.  Lungs: Respirations even and unlabored.  CTA bilaterally, No W/R/R. Abdomen: BS x 4, soft, NT/ND.  Musculoskeletal: No gross deformities, no edema.  Skin: Intact, warm, no rashes.    LABS:  BMET Recent Labs  Lab 02/05/18 1016 02/10/18 1443  NA 141 141  K 3.8 3.7  CL 102 107  CO2 19* 22  BUN 5* 5*  CREATININE 0.63 0.71  GLUCOSE 99 133*    Electrolytes Recent Labs  Lab 02/05/18 1016 02/10/18 1443  CALCIUM 8.3* 9.2  MG  --  1.8  PHOS  --  2.8    CBC Recent Labs  Lab 02/05/18 1016 02/10/18 1503  WBC 2.8* 1.7*  HGB 12.0 11.2*  HCT 38.8 35.2*  PLT 19* 5*    Coag's Recent Labs  Lab 02/10/18 1503  INR 1.20    Sepsis Markers No results for input(s): LATICACIDVEN, PROCALCITON, O2SATVEN in the last 168 hours.  ABG No results for input(s): PHART, PCO2ART, PO2ART  in the last 168 hours.  Liver Enzymes Recent Labs  Lab 02/05/18 1016 02/10/18 1443  AST 57* 73*  ALT 17 24  ALKPHOS 174* 158*  BILITOT 0.5 2.3*  ALBUMIN 3.8 3.4*    Cardiac Enzymes No results for input(s): TROPONINI, PROBNP in the last 168 hours.  Glucose No results for input(s): GLUCAP in the last 168 hours.  Imaging Dg Chest 2 View  Result Date: 02/10/2018 CLINICAL DATA:  Dyspnea. Shortness of breath with nausea and vomiting for 1 day. EXAM: CHEST - 2 VIEW COMPARISON:  Radiograph 10/22/2017, chest CT 10/25/2017 FINDINGS: The cardiomediastinal contours are normal. Sequela of prior granulomatous disease with calcified granuloma in the left lower lobe and calcified left hilar lymph nodes, unchanged. Pulmonary vasculature is normal. No consolidation, pleural effusion, or pneumothorax.  No acute osseous abnormalities are seen. IMPRESSION: 1. No acute findings. 2. Prior granulomatous disease. Electronically Signed   By: Jeb Levering M.D.   On: 02/10/2018 19:24   Ct Head Wo Contrast  Result Date: 02/10/2018 CLINICAL DATA:  Multiple falls since yesterday. EXAM: CT HEAD WITHOUT CONTRAST TECHNIQUE: Contiguous axial images were obtained from the base of the skull through the vertex without intravenous contrast. COMPARISON:  01/13/2018 FINDINGS: Brain: No evidence of acute infarction, hemorrhage, hydrocephalus, extra-axial collection or mass lesion/mass effect. Vascular: No hyperdense vessel or unexpected calcification. Skull: Normal. Negative for fracture or focal lesion. Sinuses/Orbits: No acute finding. Other: Left ear metallic object causes streak artifact through the cerebellum and brainstem. IMPRESSION: No acute intracranial abnormality. Electronically Signed   By: Fidela Salisbury M.D.   On: 02/10/2018 19:11     STUDIES:  CT head 6/4 > negative.  CULTURES: None.  ANTIBIOTICS: Ceftriaxone 6/4 >   SIGNIFICANT EVENTS: 6/4 > admit.  LINES/TUBES: None.  DISCUSSION: 47 y.o.  female admitted 6/4 with EtOH withdrawal.  Received multiple doses of ativan but remained agitated and tremulous; hence, PCCM called for transfer to ICU.  ASSESSMENT / PLAN:  NEUROLOGIC A:   EtOH withdrawal. Polysubstance abuse - boyfriend states that pt uses cocaine, last use maybe 1 week prior to admit.  UDS this admit only positive for benzos. Hx seizures, migraines, schizophrenia, depression, anxiety, bipolar affective disorder. P:   Continue precedex gtt with PRN phenobarbital. PRN Haldol. Continue preadmission dilantin, change to IV formulation. Continue preadmission topiramate. Hold preadmission buspirone, chlordiazepoxide, diazepam, fluoxetine, gabapentin, haloperidol, hydroxyzine, lactulose, trazodone.  PULMONARY A: No acute issues. P:   No interventions required.  CARDIOVASCULAR A:  Tachycardia - due to EtOH withdrawal. P:  See neuro section. Hold preadmission furosemide, spironolactone.  RENAL A:   No acute issues. P:   Continue banana bag @ 100. BMP in AM.  GASTROINTESTINAL A:   Hematemesis - had some blood tinged sputum earlier this evening in ED but none currently.  GI consulted by [facility], awaiting recs. Hx HCV, cirrhosis, esophageal varices (on EGD 12/18 by Dr. Loletha Carrow), GERD. Nutrition. P:   Continue PPI gtt, octreotide gtt. Await GI recs. No emergent need for EGD tonight. NPO until cleared by GI.  HEMATOLOGIC A:   Thrombocytopenia - chronic; due to bone marrow suppression from EtOH abuse.  S/p 1u platelet transfusion. Leukopenia - chronic. VTE Prophylaxis. P:  Monitor platelet counts. SCD's only. CBC in AM.  INFECTIOUS A:   SBP prophylaxis. P:   Abx as above (ceftriaxone).   ENDOCRINE A: No acute issues. P: No interventions required.   Family updated: Boyfriend updated at bedside.  Interdisciplinary Family Meeting v Palliative Care Meeting:  Due by: 02/16/18.  CC time: 35 min.   Montey Hora, [place] [facility] Pager: [phone]  or [phone] 02/10/2018, 10:11 PM

## 2018-02-10 NOTE — ED Notes (Signed)
Pt returned from imaging.

## 2018-02-10 NOTE — Progress Notes (Signed)
[facility] hospitalist called for admission.  Prelim admission orders were placed by me but patient was upgraded to ICU and will be admitted by ICU team.

## 2018-02-10 NOTE — ED Notes (Signed)
Pt transported to CT ?

## 2018-02-10 NOTE — ED Notes (Addendum)
Pt continues to gag and instead of using mouth swab, drinks the water that is saturating the swab and proceeds to vomit the water onto her chest. Pt told she may not have any more water for the swab or ice chips because she cannot keep any of it down. Pt not pleased but understands.

## 2018-02-11 DIAGNOSIS — K7030 Alcoholic cirrhosis of liver without ascites: Secondary | ICD-10-CM

## 2018-02-11 DIAGNOSIS — D61818 Other pancytopenia: Secondary | ICD-10-CM

## 2018-02-11 DIAGNOSIS — F10239 Alcohol dependence with withdrawal, unspecified: Secondary | ICD-10-CM

## 2018-02-11 DIAGNOSIS — K3189 Other diseases of stomach and duodenum: Secondary | ICD-10-CM

## 2018-02-11 DIAGNOSIS — I8510 Secondary esophageal varices without bleeding: Secondary | ICD-10-CM

## 2018-02-11 DIAGNOSIS — K922 Gastrointestinal hemorrhage, unspecified: Secondary | ICD-10-CM | POA: Diagnosis not present

## 2018-02-11 DIAGNOSIS — K920 Hematemesis: Secondary | ICD-10-CM | POA: Diagnosis not present

## 2018-02-11 LAB — BASIC METABOLIC PANEL
Anion gap: 12 (ref 5–15)
Anion gap: 8 (ref 5–15)
BUN: 5 mg/dL — ABNORMAL LOW (ref 6–20)
BUN: 6 mg/dL (ref 6–20)
CO2: 22 mmol/L (ref 22–32)
CO2: 23 mmol/L (ref 22–32)
Calcium: 8.2 mg/dL — ABNORMAL LOW (ref 8.9–10.3)
Calcium: 7.7 mg/dL — ABNORMAL LOW (ref 8.9–10.3)
Chloride: 107 mmol/L (ref 101–111)
Chloride: 108 mmol/L (ref 101–111)
Creatinine, Ser: 0.74 mg/dL (ref 0.44–1.00)
Creatinine, Ser: 0.69 mg/dL (ref 0.44–1.00)
GFR calc Af Amer: >60 mL/min (ref >60)
GFR calc Af Amer: >60 mL/min (ref >60)
GFR calc non Af Amer: >60 mL/min (ref >60)
GFR calc non Af Amer: >60 mL/min (ref >60)
Glucose, Bld: 105 mg/dL — ABNORMAL HIGH (ref 65–99)
Glucose, Bld: 109 mg/dL — ABNORMAL HIGH (ref 65–99)
Potassium: 3 mmol/L — ABNORMAL LOW (ref 3.5–5.1)
Potassium: 4 mmol/L (ref 3.5–5.1)
Sodium: 141 mmol/L (ref 135–145)
Sodium: 139 mmol/L (ref 135–145)

## 2018-02-11 LAB — MAGNESIUM
Magnesium: 1.3 mg/dL — ABNORMAL LOW (ref 1.7–2.4)
Magnesium: 2.1 mg/dL (ref 1.7–2.4)

## 2018-02-11 LAB — PREPARE PLATELET PHERESIS: Unit division: 0

## 2018-02-11 LAB — BPAM PLATELET PHERESIS
Blood Product Expiration Date: 201906062359
ISSUE DATE / TIME: 201906041658
Unit Type and Rh: 600

## 2018-02-11 LAB — MRSA PCR SCREENING: MRSA by PCR: NEGATIVE

## 2018-02-11 LAB — CBC
HCT: 33.4 % — ABNORMAL LOW (ref 36.0–46.0)
Hemoglobin: 10.5 g/dL — ABNORMAL LOW (ref 12.0–15.0)
MCH: 28.4 pg (ref 26.0–34.0)
MCHC: 31.4 g/dL (ref 30.0–36.0)
MCV: 90.3 fL (ref 78.0–100.0)
Platelets: <5 10*3/uL — CL (ref 150–400)
RBC: 3.7 MIL/uL — ABNORMAL LOW (ref 3.87–5.11)
RDW: 19.4 % — ABNORMAL HIGH (ref 11.5–15.5)
WBC: 3.2 10*3/uL — ABNORMAL LOW (ref 4.0–10.5)

## 2018-02-11 LAB — PHOSPHORUS: Phosphorus: 2.5 mg/dL (ref 2.5–4.6)

## 2018-02-11 MED ORDER — PANTOPRAZOLE SODIUM 40 MG PO TBEC
40.0000 mg | DELAYED_RELEASE_TABLET | Freq: Two times a day (BID) | ORAL | Status: DC
  Administered 2018-02-11 – 2018-02-12 (×4): 40 mg via ORAL
  Filled 2018-02-11 (×4): qty 1

## 2018-02-11 MED ORDER — SODIUM CHLORIDE 0.9 % IV SOLN
1.0000 mg | Freq: Once | INTRAVENOUS | Status: AC
  Administered 2018-02-11: 1 mg via INTRAVENOUS
  Filled 2018-02-11 (×2): qty 0.2

## 2018-02-11 MED ORDER — DIAZEPAM 5 MG PO TABS
5.0000 mg | ORAL_TABLET | Freq: Every day | ORAL | Status: DC
  Administered 2018-02-12 – 2018-02-14 (×3): 5 mg via ORAL
  Filled 2018-02-11 (×3): qty 1

## 2018-02-11 MED ORDER — MAGNESIUM SULFATE 2 GM/50ML IV SOLN
2.0000 g | Freq: Once | INTRAVENOUS | Status: AC
  Administered 2018-02-11: 2 g via INTRAVENOUS
  Filled 2018-02-11 (×2): qty 50

## 2018-02-11 MED ORDER — BUSPIRONE HCL 5 MG PO TABS
10.0000 mg | ORAL_TABLET | Freq: Two times a day (BID) | ORAL | Status: DC
  Administered 2018-02-11 – 2018-02-14 (×6): 10 mg via ORAL
  Filled 2018-02-11 (×4): qty 2
  Filled 2018-02-11: qty 1
  Filled 2018-02-11 (×2): qty 2

## 2018-02-11 MED ORDER — FLUOXETINE HCL 20 MG PO CAPS
20.0000 mg | ORAL_CAPSULE | Freq: Every day | ORAL | Status: DC
  Administered 2018-02-11 – 2018-02-13 (×3): 20 mg via ORAL
  Filled 2018-02-11 (×4): qty 1

## 2018-02-11 MED ORDER — TRAMADOL HCL 50 MG PO TABS
50.0000 mg | ORAL_TABLET | Freq: Four times a day (QID) | ORAL | Status: DC | PRN
  Administered 2018-02-14 (×2): 50 mg via ORAL
  Filled 2018-02-11 (×2): qty 1

## 2018-02-11 MED ORDER — POTASSIUM CHLORIDE 10 MEQ/100ML IV SOLN
10.0000 meq | INTRAVENOUS | Status: AC
  Administered 2018-02-11 (×4): 10 meq via INTRAVENOUS
  Filled 2018-02-11 (×4): qty 100

## 2018-02-11 MED ORDER — SODIUM CHLORIDE 0.9 % IV SOLN
INTRAVENOUS | Status: DC
  Administered 2018-02-11 – 2018-02-12 (×2): via INTRAVENOUS

## 2018-02-11 MED ORDER — THIAMINE HCL 100 MG/ML IJ SOLN
100.0000 mg | Freq: Every day | INTRAMUSCULAR | Status: DC
  Administered 2018-02-12: 100 mg via INTRAVENOUS
  Filled 2018-02-11: qty 2

## 2018-02-11 MED ORDER — CHLORDIAZEPOXIDE HCL 10 MG PO CAPS
10.0000 mg | ORAL_CAPSULE | Freq: Three times a day (TID) | ORAL | Status: DC | PRN
  Administered 2018-02-12 – 2018-02-13 (×3): 10 mg via ORAL
  Filled 2018-02-11: qty 1
  Filled 2018-02-11 (×2): qty 2
  Filled 2018-02-11: qty 1
  Filled 2018-02-11: qty 2
  Filled 2018-02-11: qty 1

## 2018-02-11 MED ORDER — SODIUM CHLORIDE 0.9 % IV SOLN: Freq: Once | INTRAVENOUS | Status: DC

## 2018-02-11 NOTE — Evaluation (Signed)
Physical Therapy Evaluation Patient Details Name: Briana Sweeney MRN: [id number] DOB: 1970-11-27 Today's Date: 02/11/2018   History of Present Illness  47 y.o. female with PMH as outlined below including EtOH abuse, polysubstance abuse, cirrhosis, HCV, esophageal varices (confirmed on EGD 12/18), depression, anxiety, bipolar affective disorder, schizophrenia, seizures. She presented to [facility] ED 6/4 with tremors, concerns for EtOH withdrawal, and hematemesis that began earlier that day per boyfriend.  Clinical Impression  Pt admitted with above diagnosis. Pt currently with functional limitations due to the deficits listed below (see PT Problem List). Pt is mod I for bed mobility, supervision for transfers and minA for ambulation to and from bathroom. Pt limited in mobility today by headache and requested to go back to bed after using the bathroom. Pt will benefit from skilled PT to increase their independence and safety with mobility to allow discharge to the venue listed below.       Follow Up Recommendations Home health PT;Supervision for mobility/OOB    Equipment Recommendations  Rolling walker with 5" wheels    Recommendations for Other Services OT consult     Precautions / Restrictions Precautions Precautions: Fall Restrictions Weight Bearing Restrictions: No      Mobility  Bed Mobility Overal bed mobility: Independent             General bed mobility comments: came to long sitting in bed and able to swing legs around to come to EoB without assist  Transfers Overall transfer level: Needs assistance Equipment used: None Transfers: Sit to/from Stand Sit to Stand: Supervision         General transfer comment: supervision for sit>stand from bed, use handrail in bathroom to come to standing  Ambulation/Gait Ambulation/Gait assistance: Min assist Ambulation Distance (Feet): 20 Feet Assistive device: 1 person hand held assist Gait Pattern/deviations: Shuffle;Trunk  flexed;Decreased stride length Gait velocity: slowed Gait velocity interpretation: 1.31 - 2.62 ft/sec, indicative of limited community ambulator General Gait Details: minA to steady with ambulation to and from bathroom, general instability, no overt LoB       Balance Overall balance assessment: Needs assistance Sitting-balance support: Feet unsupported;No upper extremity supported Sitting balance-Leahy Scale: Fair     Standing balance support: Single extremity supported Standing balance-Leahy Scale: Poor                               Pertinent Vitals/Pain Pain Assessment: 0-10 Pain Score: 8  Pain Location: headache 8/10 and bilateral LE 4/10 Pain Descriptors / Indicators: Headache;Aching;Sore Pain Intervention(s): Limited activity within patient's tolerance;Monitored during session;Repositioned    Home Living Family/patient expects to be discharged to:: Private residence Living Arrangements: Other relatives;Spouse/significant other(step son ) Available Help at Discharge: Available PRN/intermittently;Friend(s) Type of Home: House Home Access: Stairs to enter   [employer] of Steps: 5 Home Layout: One level Home Equipment: None      Prior Function Level of Independence: Independent         Comments: able to walk to store but reports occasional falling      Hand Dominance        Extremity/Trunk Assessment   Upper Extremity Assessment Upper Extremity Assessment: Overall WFL for tasks assessed    Lower Extremity Assessment Lower Extremity Assessment: Overall WFL for tasks assessed          Cognition Arousal/Alertness: Awake/alert Behavior During Therapy: Flat affect Overall Cognitive Status: History of cognitive impairments - at baseline  General Comments: A & O x4, slow processing      General Comments General comments (skin integrity, edema, etc.): VSS        Assessment/Plan     PT Assessment Patient needs continued PT services  PT Problem List Decreased strength;Decreased balance;Decreased activity tolerance;Decreased coordination;Decreased safety awareness;Pain;Decreased cognition       PT Treatment Interventions DME instruction;Gait training;Stair training;Functional mobility training;Therapeutic activities;Therapeutic exercise;Balance training;Cognitive remediation;Patient/family education    PT Goals (Current goals can be found in the Care Plan section)  Acute Rehab PT Goals Patient Stated Goal: feel better PT Goal Formulation: With patient Time For Goal Achievement: 02/25/18 Potential to Achieve Goals: Fair    Frequency Min 3X/week   Barriers to discharge Decreased caregiver support         AM-PAC PT "6 Clicks" Daily Activity  Outcome Measure Difficulty turning over in bed (including adjusting bedclothes, sheets and blankets)?: A Little Difficulty moving from lying on back to sitting on the side of the bed? : A Little Difficulty sitting down on and standing up from a chair with arms (e.g., wheelchair, bedside commode, etc,.)?: A Little Help needed moving to and from a bed to chair (including a wheelchair)?: A Little Help needed walking in hospital room?: A Little Help needed climbing 3-5 steps with a railing? : A Lot 6 Click Score: 17    End of Session Equipment Utilized During Treatment: Gait belt Activity Tolerance: Patient limited by lethargy Patient left: in bed;with call bell/phone within reach;with bed alarm set Nurse Communication: Mobility status PT Visit Diagnosis: Unsteadiness on feet (R26.81);Repeated falls (R29.6);History of falling (Z91.81);Other abnormalities of gait and mobility (R26.89);Difficulty in walking, not elsewhere classified (R26.2);Pain Pain - part of body: (bilateral LE and headache)    Time: 1208-1223 PT Time Calculation (min) (ACUTE ONLY): 15 min   Charges:   PT Evaluation $PT Eval Moderate Complexity: 1 Mod      PT G Codes:        Briana Sweeney B. Briana Sweeney PT, DPT Acute Rehabilitation  [phone] Pager [phone]    [place] 02/11/2018, 1:47 PM

## 2018-02-11 NOTE — Consult Note (Addendum)
[facility] Consult: 8:14 AM 02/11/2018  LOS: 1 day    Referring Provider: Dr Lamonte Sakai  Primary Care Physician:  [name], FNP Primary Gastroenterologist:  Althia Forts.       Reason for Consultation:  Coffee ground emesis.     HPI: Briana Sweeney is a 47 y.o. female.  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  ETOH hepatitis.  Previous "shots" for HCV in [place]. 07/2017 labs with HCV Ab >11 but HCV quant "HCV not detected".  Repeat HCV RNA negative on 10/21/17.  Still drinks heavily.  6 admits 07/2017 - 01/08/2018 for problems related to ETOH, 1 of these was psych admission in late 10/2017.   Ascites, s/p paracentesis without SBP 07/2017.  Jaundice.   Seizures.  Depression/aniety/bipolar/schizophrenia. Kidney stones.  Pancytopenia.   S/p bone marrow biopsy.   Hyponatremia.   EGD 09/03/2017. For hematemesis.  Dr Loletha Carrow.  Grade 2, non-bleeding and non-stigmata distal esophageal varices.  Portal hypertensive gastropathy.     Rectal bleeding attributed to thrombocytopenia during 4/26 - 01/08/18 admission.  Dr Marin Olp oversaw bone marrow biopsy 4/30;/19.  She required transfusion with platelets.  Ultrasound 01/02/2018 showed cirrhosis, cholelithiasis, normal bile ducts and normal spleen size. Home meds include Aldactone '50mg'$ , furosemide 20 mg Lactuose, famotidine.  Pentoxifylline discontinued as of 4/26.  Not on beta blocker.   She visited the ED on 01/13/2018, 01/24/2018 with acute alcohol intoxication.  Came to ED with CGE, confusion, multiple recent falls.  Blood tinged sputum noted in ED. she says the vomiting started about 4:30 AM yesterday morning and was dark from the get go.  She also saw some small streaks of blood in the emesis.  It lasted for several hours.  She did not have any tarry or bloody stools.  No abdominal pain.   Before the symptoms started, in recent weeks she denies problems with her stomach or appetite. Head CT negative.   Pancytopenic.  Hgb 11.2 >> 10.5.  Platelets <5.  PT/INR 15/1.2.   BUN/creat not elevated.  K 3.0.  Na normal.   t bili 2.3, alk phos 158, AST/ALT 73/24.     Stool FOBT negative.   ETOH < 10.    Received 2 U platelets.  Octreotide gtt, Protonix gtt, Rocephin, in place.  Nausea and vomiting have ceased.  Currently she is complaining of headaches and body aches.  She denies use of BC, Goody's, NSAIDs, aspirin products at home.    Past Medical History:  Diagnosis Date  . Anxiety   . Bipolar affective disorder ([place])    With anxiety features  . Cirrhosis of liver ([place])    Due to alcohol and hepatitis C  . Depression   . GERD (gastroesophageal reflux disease)   . Hepatitis C 2018   hepatitis c and alcohol related hepatitis  . History of blood transfusion    "blood doesn't clot; I fell down and had to have a transfusion"  . History of kidney stones   . Migraine    "when I get really stressed" (09/01/2017)  . Schizophrenia ([place])   .  Seizures ([place])    "when I run out of my RX; lots recently" (09/01/2017)    Past Surgical History:  Procedure Laterality Date  . ESOPHAGOGASTRODUODENOSCOPY N/A 09/03/2017   Procedure: ESOPHAGOGASTRODUODENOSCOPY (EGD);  Surgeon: Doran Stabler, MD;  Location: [facility];  Service: Gastroenterology;  Laterality: N/A;  . FINGER FRACTURE SURGERY Left    "shattered my pinky"  . FRACTURE SURGERY    . IR PARACENTESIS  07/23/2017  . IR PARACENTESIS  07/2017   "did it twice in the same week" (09/01/2017)  . SHOULDER OPEN ROTATOR CUFF REPAIR Right   . TUBAL LIGATION    . VAGINAL HYSTERECTOMY      Prior to Admission medications   Medication Sig Start Date End Date Taking? Authorizing Provider  busPIRone (BUSPAR) 10 MG tablet Take 1 tablet (10 mg total) by mouth 2 (two) times daily. 02/05/18  Yes [name], FNP  chlordiazePOXIDE  (LIBRIUM) 10 MG capsule Take 10 mg by mouth 3 (three) times daily as needed for anxiety.   Yes [provider]  diazepam (VALIUM) 5 MG tablet Take 5 mg by mouth daily.   Yes [provider]  famotidine (PEPCID) 20 MG tablet Take 1 tablet (20 mg total) by mouth 2 (two) times daily. 01/08/18  Yes Eugenie Filler, MD  FLUoxetine (PROZAC) 20 MG capsule Take 1 capsule (20 mg total) by mouth daily. For mood control 01/08/18  Yes Eugenie Filler, MD  furosemide (LASIX) 20 MG tablet Take 1 tablet (20 mg total) by mouth daily. 01/08/18  Yes Eugenie Filler, MD  gabapentin (NEURONTIN) 100 MG capsule Take 2 capsules (200 mg total) by mouth 3 (three) times daily. 01/08/18  Yes Eugenie Filler, MD  haloperidol (HALDOL) 2 MG tablet Take 1 tablet (2 mg total) by mouth at bedtime. 01/08/18  Yes Eugenie Filler, MD  hydrOXYzine (ATARAX/VISTARIL) 25 MG tablet Take 1 tablet (25 mg total) by mouth 3 (three) times daily as needed for anxiety. 02/05/18  Yes [name], FNP  Multiple Vitamin (MULTIVITAMIN WITH MINERALS) TABS tablet Take 1 tablet by mouth daily. 01/09/18  Yes Eugenie Filler, MD  phenytoin (DILANTIN) 100 MG ER capsule Take 300 mg by mouth at bedtime.   Yes [provider]  topiramate (TOPAMAX) 50 MG tablet Take 1 tablet (50 mg total) by mouth daily. 02/05/18  Yes [name], FNP  traMADol (ULTRAM) 50 MG tablet Take 1 tablet (50 mg total) by mouth every 6 (six) hours as needed for moderate pain. 01/08/18  Yes Eugenie Filler, MD  traZODone (DESYREL) 50 MG tablet Take 1 tablet (50 mg total) by mouth at bedtime as needed for sleep. 02/05/18  Yes [name], FNP  folic acid (FOLVITE) 1 MG tablet Take 1 tablet (1 mg total) by mouth daily. Patient not taking: Reported on 02/10/2018 01/09/18   Eugenie Filler, MD  lactulose (CHRONULAC) 10 GM/15ML solution Take 30 mLs (20 g total) by mouth daily. Patient not taking: Reported on 02/10/2018 01/09/18   Eugenie Filler,  MD  spironolactone (ALDACTONE) 50 MG tablet Take 1 tablet (50 mg total) by mouth daily. Patient not taking: Reported on 02/10/2018 01/08/18   Eugenie Filler, MD  thiamine 100 MG tablet Take 1 tablet (100 mg total) by mouth daily. Patient not taking: Reported on 02/10/2018 01/09/18   Eugenie Filler, MD    Scheduled Meds: . Derrill Memo ON 02/14/2018] pantoprazole  40 mg Intravenous Q12H  . phenytoin (  DILANTIN) IV  100 mg Intravenous Q8H  . topiramate  50 mg Oral Daily   Infusions: . sodium chloride    . cefTRIAXone (ROCEPHIN)  IV Stopped (02/11/18 0100)  . dexmedetomidine (PRECEDEX) IV infusion    . octreotide  (SANDOSTATIN)    IV infusion 50 mcg/hr (02/11/18 0800)  . pantoprozole (PROTONIX) infusion 8 mg/hr (02/11/18 0800)  . potassium chloride 10 mEq (02/11/18 0730)  . banana bag IV 1000 mL Stopped (02/10/18 2146)   PRN Meds: haloperidol lactate, ipratropium-albuterol, ondansetron **OR** ondansetron (ZOFRAN) IV, PHENObarbital, PHENObarbital   Allergies as of 02/10/2018  . (No Known Allergies)    Family History  Problem Relation Age of Onset  . Lung cancer Mother 39  . Alcohol abuse Mother   . Throat cancer Father 13    Social History   Socioeconomic History  . Marital status: Divorced    Spouse name: Not on file  . Number of children: Not on file  . Years of education: Not on file  . Highest education level: Not on file  Occupational History  . Occupation: applying for disability  Social Needs  . Financial resource strain: Not on file  . Food insecurity:    Worry: Not on file    Inability: Not on file  . Transportation needs:    Medical: Not on file    Non-medical: Not on file  Tobacco Use  . Smoking status: Never Smoker  . Smokeless tobacco: Never Used  Substance and Sexual Activity  . Alcohol use: Yes    Alcohol/week: 37.8 oz    Types: 63 Cans of beer per week    Comment: weekly "I have cut back"  . Drug use: No  . Sexual activity: Not Currently  Lifestyle    . Physical activity:    Days per week: Not on file    Minutes per session: Not on file  . Stress: Not on file  Relationships  . Social connections:    Talks on phone: Not on file    Gets together: Not on file    Attends religious service: Not on file    Active member of club or organization: Not on file    Attends meetings of clubs or organizations: Not on file    Relationship status: Not on file  . Intimate partner violence:    Fear of current or ex partner: Not on file    Emotionally abused: Not on file    Physically abused: Not on file    Forced sexual activity: Not on file  Other Topics Concern  . Not on file  Social History Narrative   She moved with a boyfriend to [place] and was followed at [facility].  He died of a massive heart attack in 8/18, per her report, and so she moved back to [place] and is living with a friend.    REVIEW OF SYSTEMS: Constitutional: After falling a few days ago she was not sure whether or not this was from weakness or from a seizure. ENT: Occasional epistaxis. Pulm: No cough.  No trouble bleeding. CV:  No palpitations, no LE edema.  GU:  No hematuria, no frequency GI: No dysphagia.  No bloody or black stools.  See HPI Heme: Has developed bruising on her body from recent falls but she does not think of herself with some but he has spontaneous bruising. Transfusions: Recorded transfusions with platelets as recently as April 2019. Neuro:  Headache today, not her norm.  Possible seizure a  few days ago when she fell. Derm:  No itching, no rash or sores.  Denies use of tanning bed but sits outside at the pool a lot and hands excessively.  Recently had a sunburn which caused blistering and now scabs on her lips. Endocrine:  No sweats or chills.  No polyuria or dysuria Immunization: Did not inquire as to recent or previous immunizations. Travel:  None beyond local counties in last few months.    PHYSICAL EXAM: Vital signs in last 24  hours: Vitals:   02/11/18 0700 02/11/18 0722  BP: 108/77   Pulse: (!) 101   Resp: 19   Temp:  98.6 F (37 C)  SpO2: 90%    Wt Readings from Last 3 Encounters:  02/10/18 126 lb 12.2 oz (57.5 kg)  02/05/18 130 lb (59 kg)  01/25/18 130 lb (59 kg)    General: Excessively tanned, not acutely ill but unwell appearing. Head: No facial asymmetry or swelling.  Eyes: No scleral icterus.  No conjunctival pallor.  EOMI. Ears: Not hard of hearing. Nose: No discharge or congestion.  No dried blood. Mouth:  Scabs on her lower lips.  Tongue midline.  Oral mucosa slightly dry.  Prominent vasculature at the left lateral tongue, question from previous tongue biting trauma? Neck: No JVD, no masses, no thyromegaly. Lungs: No cough.  Voice a bit hoarse.  Lungs clear.  No dyspnea. Heart: RRR.  No MRG.  S1, S2 present Abdomen: Soft.  Not tender or distended.  No HSM, masses, bruits, hernias.  Bowel sounds active..   Rectal: Deferred Musc/Skeltl: No joint redness, swelling or significant deformity. Extremities: No CCE. Neurologic: Alert.  Oriented x3.  Appropriate.  Moves all 4 limbs.  No asterixis or tremor. Skin: Excessively suntanned.  No suspicious rashes, sores, lesions.  No telangiectasia. Tattoos: Yes.  Very large tattoo covering almost her entire back. Nodes: No cervical adenopathy. Psych: Cooperative, pleasant, minimally anxious.  Intake/Output from previous day: 06/04 0701 - 06/05 0700 In: 1722.7 [I.V.:456.7; Blood:1116; IV Piggyback:150] Out: 250 [Urine:250] Intake/Output this shift: Total I/O In: 200 [I.V.:200] Out: -   LAB RESULTS: Recent Labs    02/10/18 1503 02/11/18 0019  WBC 1.7* 3.2*  HGB 11.2* 10.5*  HCT 35.2* 33.4*  PLT 5* <5*   BMET Lab Results  Component Value Date   NA 141 02/11/2018   NA 141 02/10/2018   NA 141 02/05/2018   K 3.0 (L) 02/11/2018   K 3.7 02/10/2018   K 3.8 02/05/2018   CL 107 02/11/2018   CL 107 02/10/2018   CL 102 02/05/2018   CO2 22  02/11/2018   CO2 22 02/10/2018   CO2 19 (L) 02/05/2018   GLUCOSE 105 (H) 02/11/2018   GLUCOSE 133 (H) 02/10/2018   GLUCOSE 99 02/05/2018   BUN 5 (L) 02/11/2018   BUN 5 (L) 02/10/2018   BUN 5 (L) 02/05/2018   CREATININE 0.74 02/11/2018   CREATININE 0.71 02/10/2018   CREATININE 0.63 02/05/2018   CALCIUM 8.2 (L) 02/11/2018   CALCIUM 9.2 02/10/2018   CALCIUM 8.3 (L) 02/05/2018   LFT Recent Labs    02/10/18 1443  PROT 8.8*  ALBUMIN 3.4*  AST 73*  ALT 24  ALKPHOS 158*  BILITOT 2.3*   PT/INR Lab Results  Component Value Date   INR 1.20 02/10/2018   INR 1.14 01/14/2018   INR 1.17 01/07/2018   Hepatitis Panel No results for input(s): HEPBSAG, HCVAB, HEPAIGM, HEPBIGM in the last 72 hours. C-Diff No components found  for: CDIFF Lipase     Component Value Date/Time   LIPASE 72 (H) 09/23/2017 1508    Drugs of Abuse     Component Value Date/Time   LABOPIA NONE DETECTED 02/10/2018 2151   COCAINSCRNUR NONE DETECTED 02/10/2018 2151   LABBENZ POSITIVE (A) 02/10/2018 2151   AMPHETMU NONE DETECTED 02/10/2018 2151   THCU NONE DETECTED 02/10/2018 2151   LABBARB NONE DETECTED 02/10/2018 2151     RADIOLOGY STUDIES: Dg Chest 2 View  Result Date: 02/10/2018 CLINICAL DATA:  Dyspnea. Shortness of breath with nausea and vomiting for 1 day. EXAM: CHEST - 2 VIEW COMPARISON:  Radiograph 10/22/2017, chest CT 10/25/2017 FINDINGS: The cardiomediastinal contours are normal. Sequela of prior granulomatous disease with calcified granuloma in the left lower lobe and calcified left hilar lymph nodes, unchanged. Pulmonary vasculature is normal. No consolidation, pleural effusion, or pneumothorax. No acute osseous abnormalities are seen. IMPRESSION: 1. No acute findings. 2. Prior granulomatous disease. Electronically Signed   By: Jeb Levering M.D.   On: 02/10/2018 19:24   Ct Head Wo Contrast  Result Date: 02/10/2018 CLINICAL DATA:  Multiple falls since yesterday. EXAM: CT HEAD WITHOUT CONTRAST  TECHNIQUE: Contiguous axial images were obtained from the base of the skull through the vertex without intravenous contrast. COMPARISON:  01/13/2018 FINDINGS: Brain: No evidence of acute infarction, hemorrhage, hydrocephalus, extra-axial collection or mass lesion/mass effect. Vascular: No hyperdense vessel or unexpected calcification. Skull: Normal. Negative for fracture or focal lesion. Sinuses/Orbits: No acute finding. Other: Left ear metallic object causes streak artifact through the cerebellum and brainstem. IMPRESSION: No acute intracranial abnormality. Electronically Signed   By: Fidela Salisbury M.D.   On: 02/10/2018 19:11     IMPRESSION:   *   CGE, minor hematemesis in pt with dx of grade 2 distal esoph varices and portal gastropathy established at EGD 09/03/17.  Not on BB at home.  Takes BID Pepcid.  No NSAIDS, ASA.    *   Pancytopenia, chronic.  Critically low platelets.  S/p 2 U platelets.  PT/INR ok.    *   Cirrhosis of liver from ETOH (ongoing) and Hep C (eradicated).    *  ETOH dependence.  Chronic  *   Seizure disorder.    PLAN:     *   Case d/w Dr Loletha Carrow.  Supportive care, observation for now.  Ok for sips clears and ice chips.   Switch to oral PPI.   No EGD today, if/when done will need anesthesia assist and/or intubation.     Azucena Freed  02/11/2018, 8:14 AM Phone [phone]   I have reviewed the entire case in detail with the above APP and discussed the plan in detail.  Therefore, I agree with the diagnoses recorded above. In addition,  I have personally interviewed and examined the patient and have personally reviewed any abdominal/pelvic CT scan images.  My additional thoughts are as follows:  End stage liver disease with ongoing EtOH abuse, known esophageal varices, pancytopenia with platelets < 5k, admitted with depression, anxiety, SI and reported hematemesis.  No visible bleeding since admission, Hgb stable. One dose platelets given so far.   Tremulous,  seems to be through the worst of EtOH withdrawal at this point.  My plan is supportive care with octreotide another 24 hrs, oral protonix 40 mg twice daily (IV in short supply), clear liquid diet and observation.  I am not currently planning EGD since not actively bleeding now and risk of causing bleeding very high  with such severe thrombocytopenia.  Her long term prognosis is poor, especially if she continues to abuse alcohol.  We will follow.    Nelida Meuse III Office:[phone]

## 2018-02-11 NOTE — Telephone Encounter (Signed)
Patient is in the hospital and provider has been made aware

## 2018-02-11 NOTE — H&P (Signed)
HISTORY & PHYSICAL  ** Please note: this is a copy of the CONSULT NOTE (timestamped 02/10/2018 10:11 PM), which was incorrectly entered as a consult note. It is the H&P for this patient's current hospital admission. **  Attestation signed by Renee Pain, MD at 02/11/2018 12:15 AM  I saw and evaluated the patient, performing the key elements of the service. I discussed the findings, assessment and plan with Montey Hora, PA-C and agree with the documentation attached.  This 47 year old Caucasian female smoker and heavy drinker presented to the emergency department on 02/10/2018 with tremors, emesis and hematemesis earlier in the day.  The patient is a known liver cirrhosis patient who has a previous history of hematemesis.  She has known esophageal varices.  In the emergency department, the patient has received multiple, significant doses of Ativan and has had suboptimal control of alcohol withdrawal symptoms.  Her last drink was reportedly just over 24 hours ago.  She drinks beer and liquor.  At the time of clinical interview, the patient is having visual hallucinations.  She counts 4 or 5 people in the room (there are only 3).  She states that it is 1999 and then follows up with 1973.  She is able to identify POTUS.  She is unable to correctly provide her boyfriend's full name (gets the last name correct, but the first name wrong).  In fact, she is able to provide multiple versions of her own name, although she is unable to provide the name which is on her driver's license (she states that the last name on her driver's license and on her medical record is from a previous marriage).  Her home medications include Librium, Valium, BuSpar, Prozac, Haldol, Dilantin and Topamax, likely contributing to her relatively modest response to treatment with Ativan.  No hematemesis during examination.  She does have multiple bruises but no overt ongoing bleeding.  Labs reviewed.  Notably, she is pancytopenic  profoundly thrombocytopenic.  Her transaminases are "normal"; however, her total bilirubin is 2.3.  INR 1.2.  We will admit to ICU for treatment of alcohol withdrawal syndrome with Precedex infusion and phenobarbital.  Stop benzodiazepines.   Renee Pain Board Certified by the ABIM, [place] [facility] Pager: [phone]   Name: Briana Sweeney MRN:   [id number] DOB:   08-07-1971           ADMISSION DATE:  02/10/2018 CONSULTATION DATE:  02/10/18  REFERRING MD:  Hugelmeyer - EDP  CHIEF COMPLAINT:  EtOH withdrawal  HISTORY OF PRESENT ILLNESS:  Pt is encephelopathic; therefore, this HPI is obtained from chart review. Briana Sweeney is a 47 y.o. female with PMH as outlined below including EtOH abuse, polysubstance abuse, cirrhosis, HCV, esophageal varices (confirmed on EGD 12/18), depression, anxiety, bipolar affective disorder, schizophrenia, seizures. She presented to [facility] ED 6/4 with tremors, concerns for EtOH withdrawal, and hematemesis that began earlier that day per boyfriend.  Last drink was 1 day prior.  She is a heavy drinker, multiple liquor drinks a day, at least 1/5.  She also drinks beer and mixes liquor into the beer.  Boyfriend states she woke up around 430am that morning vomiting coffee ground emesis.  Later had 1 episode of bright red blood tinged sputum.  She also had several mechanical falls the day prior and hit her shoulder / back.  Denies any loss of consciousness.    She had CT of the head in ED which was negative.  She was started on CIWA and despite multiple rounds of ativan, she remained agitated and tremulous.  PCCM was subsequently called for consideration of precedex and ICU admission.  PAST MEDICAL HISTORY :  She  has a past medical history of Anxiety, Bipolar affective disorder ([place]), Cirrhosis of liver ([place]), Depression, GERD (gastroesophageal reflux disease), Hepatitis C (2018), History of  blood transfusion, History of kidney stones, Migraine, Schizophrenia ([place]), and Seizures ([place]).  PAST SURGICAL HISTORY: She  has a past surgical history that includes IR Paracentesis (07/23/2017); Tubal ligation; Finger fracture surgery (Left); Fracture surgery; Shoulder open rotator cuff repair (Right); Vaginal hysterectomy; IR Paracentesis (07/2017); and Esophagogastroduodenoscopy (N/A, 09/03/2017).  No Known Allergies  No current facility-administered medications on file prior to encounter.        Current Outpatient Medications on File Prior to Encounter  Medication Sig  . busPIRone (BUSPAR) 10 MG tablet Take 1 tablet (10 mg total) by mouth 2 (two) times daily.  . chlordiazePOXIDE (LIBRIUM) 10 MG capsule Take 10 mg by mouth 3 (three) times daily as needed for anxiety.  . diazepam (VALIUM) 5 MG tablet Take 5 mg by mouth daily.  . famotidine (PEPCID) 20 MG tablet Take 1 tablet (20 mg total) by mouth 2 (two) times daily.  [name] Kitchen FLUoxetine (PROZAC) 20 MG capsule Take 1 capsule (20 mg total) by mouth daily. For mood control  . furosemide (LASIX) 20 MG tablet Take 1 tablet (20 mg total) by mouth daily.  [name] Kitchen gabapentin (NEURONTIN) 100 MG capsule Take 2 capsules (200 mg total) by mouth 3 (three) times daily.  . haloperidol (HALDOL) 2 MG tablet Take 1 tablet (2 mg total) by mouth at bedtime.  . hydrOXYzine (ATARAX/VISTARIL) 25 MG tablet Take 1 tablet (25 mg total) by mouth 3 (three) times daily as needed for anxiety.  . Multiple Vitamin (MULTIVITAMIN WITH MINERALS) TABS tablet Take 1 tablet by mouth daily.  . phenytoin (DILANTIN) 100 MG ER capsule Take 300 mg by mouth at bedtime.  . topiramate (TOPAMAX) 50 MG tablet Take 1 tablet (50 mg total) by mouth daily.  . traMADol (ULTRAM) 50 MG tablet Take 1 tablet (50 mg total) by mouth every 6 (six) hours as needed for moderate pain.  . traZODone (DESYREL) 50 MG tablet Take 1 tablet (50 mg total) by mouth at bedtime as needed for sleep.  . folic acid  (FOLVITE) 1 MG tablet Take 1 tablet (1 mg total) by mouth daily. (Patient not taking: Reported on 02/10/2018)  . lactulose (CHRONULAC) 10 GM/15ML solution Take 30 mLs (20 g total) by mouth daily. (Patient not taking: Reported on 02/10/2018)  . spironolactone (ALDACTONE) 50 MG tablet Take 1 tablet (50 mg total) by mouth daily. (Patient not taking: Reported on 02/10/2018)  . thiamine 100 MG tablet Take 1 tablet (100 mg total) by mouth daily. (Patient not taking: Reported on 02/10/2018)    FAMILY HISTORY:  Her indicated that her mother is deceased. She indicated that her father is deceased.   SOCIAL HISTORY: She  reports that she has never smoked. She has never used smokeless tobacco. She reports that she drinks about 37.8 oz of alcohol per week. She reports that she does not use drugs.  REVIEW OF SYSTEMS:  Unable to obtain as pt is encephalopathic.  SUBJECTIVE:  Awake but confused.  Thinks it is 23 and that she is at home.  VITAL SIGNS: BP (!) 153/91   Pulse (!) 110   Temp 98.1 F (36.7 C) (Oral)   Resp 19  Ht 5\' 2"  (1.575 m)   Wt 59 kg (130 lb)   LMP  (LMP Unknown)   SpO2 98%   BMI 23.78 kg/m   HEMODYNAMICS:  VENTILATOR SETTINGS:  INTAKE / OUTPUT: I/O last 3 completed shifts: In: 550 [Blood:550] Out: -    PHYSICAL EXAMINATION: General: Adult female, tremulous, in NAD. Neuro: Awake.  Confused.  Thinks she is at home and that it is 39.  Tremulous with + asterixis. HEENT: [place]/AT. Sclerae anicteric, EOMI.  Dried blood to lips. Cardiovascular: Tachy, regular, no M/R/G.  Lungs: Respirations even and unlabored.  CTA bilaterally, No W/R/R. Abdomen: BS x 4, soft, NT/ND.  Musculoskeletal: No gross deformities, no edema.  Skin: Intact, warm, no rashes.    LABS:  BMET LastLabs  Recent Labs  Lab 02/05/18 1016 02/10/18 1443  NA 141 141  K 3.8 3.7  CL 102 107  CO2 19* 22  BUN 5* 5*  CREATININE 0.63 0.71  GLUCOSE 99 133*       Electrolytes LastLabs      Recent Labs  Lab 02/05/18 1016 02/10/18 1443  CALCIUM 8.3* 9.2  MG  --  1.8  PHOS  --  2.8      CBC LastLabs      Recent Labs  Lab 02/05/18 1016 02/10/18 1503  WBC 2.8* 1.7*  HGB 12.0 11.2*  HCT 38.8 35.2*  PLT 19* 5*      Coag's LastLabs  Recent Labs  Lab 02/10/18 1503  INR 1.20      Sepsis Markers LastLabs  No results for input(s): LATICACIDVEN, PROCALCITON, O2SATVEN in the last 168 hours.    ABG LastLabs  No results for input(s): PHART, PCO2ART, PO2ART in the last 168 hours.    Liver Enzymes LastLabs  Recent Labs  Lab 02/05/18 1016 02/10/18 1443  AST 57* 73*  ALT 17 24  ALKPHOS 174* 158*  BILITOT 0.5 2.3*  ALBUMIN 3.8 3.4*      Cardiac Enzymes LastLabs  No results for input(s): TROPONINI, PROBNP in the last 168 hours.    Glucose LastLabs  No results for input(s): GLUCAP in the last 168 hours.    Imaging Dg Chest 2 View  Result Date: 02/10/2018 CLINICAL DATA:  Dyspnea. Shortness of breath with nausea and vomiting for 1 day. EXAM: CHEST - 2 VIEW COMPARISON:  Radiograph 10/22/2017, chest CT 10/25/2017 FINDINGS: The cardiomediastinal contours are normal. Sequela of prior granulomatous disease with calcified granuloma in the left lower lobe and calcified left hilar lymph nodes, unchanged. Pulmonary vasculature is normal. No consolidation, pleural effusion, or pneumothorax. No acute osseous abnormalities are seen. IMPRESSION: 1. No acute findings. 2. Prior granulomatous disease. Electronically Signed   By: Jeb Levering M.D.   On: 02/10/2018 19:24   Ct Head Wo Contrast  Result Date: 02/10/2018 CLINICAL DATA:  Multiple falls since yesterday. EXAM: CT HEAD WITHOUT CONTRAST TECHNIQUE: Contiguous axial images were obtained from the base of the skull through the vertex without intravenous contrast. COMPARISON:  01/13/2018 FINDINGS: Brain: No evidence of acute infarction, hemorrhage,  hydrocephalus, extra-axial collection or mass lesion/mass effect. Vascular: No hyperdense vessel or unexpected calcification. Skull: Normal. Negative for fracture or focal lesion. Sinuses/Orbits: No acute finding. Other: Left ear metallic object causes streak artifact through the cerebellum and brainstem. IMPRESSION: No acute intracranial abnormality. Electronically Signed   By: Fidela Salisbury M.D.   On: 02/10/2018 19:11     STUDIES:  CT head 6/4 > negative.  CULTURES: None.  ANTIBIOTICS: Ceftriaxone 6/4 >  SIGNIFICANT EVENTS: 6/4 > admit.  LINES/TUBES: None.  DISCUSSION: 47 y.o. female admitted 6/4 with EtOH withdrawal.  Received multiple doses of ativan but remained agitated and tremulous; hence, PCCM called for transfer to ICU.  ASSESSMENT / PLAN:  NEUROLOGIC A:   EtOH withdrawal. Polysubstance abuse - boyfriend states that pt uses cocaine, last use maybe 1 week prior to admit.  UDS this admit only positive for benzos. Hx seizures, migraines, schizophrenia, depression, anxiety, bipolar affective disorder. P:   Continue precedex gtt with PRN phenobarbital. PRN Haldol. Continue preadmission dilantin, change to IV formulation. Continue preadmission topiramate. Hold preadmission buspirone, chlordiazepoxide, diazepam, fluoxetine, gabapentin, haloperidol, hydroxyzine, lactulose, trazodone.  PULMONARY A: No acute issues. P:   No interventions required.  CARDIOVASCULAR A:  Tachycardia - due to EtOH withdrawal. P:  See neuro section. Hold preadmission furosemide, spironolactone.  RENAL A:   No acute issues. P:   Continue banana bag @ 100. BMP in AM.  GASTROINTESTINAL A:   Hematemesis - had some blood tinged sputum earlier this evening in ED but none currently.  GI consulted by [facility], awaiting recs. Hx HCV, cirrhosis, esophageal varices (on EGD 12/18 by Dr. Loletha Carrow), GERD. Nutrition. P:   Continue PPI gtt, octreotide gtt. Await GI recs. No  emergent need for EGD tonight. NPO until cleared by GI.  HEMATOLOGIC A:   Thrombocytopenia - chronic; due to bone marrow suppression from EtOH abuse.  S/p 1u platelet transfusion. Leukopenia - chronic. VTE Prophylaxis. P:  Monitor platelet counts. SCD's only. CBC in AM.  INFECTIOUS A:   SBP prophylaxis. P:   Abx as above (ceftriaxone).   ENDOCRINE A: No acute issues. P: No interventions required.   Family updated: Boyfriend updated at bedside.  Interdisciplinary Family Meeting v Palliative Care Meeting:  Due by: 02/16/18.  CC time: 35 min.   Montey Hora, [place] - [facility] Pager: [phone]  or [phone] 02/10/2018, 10:11 PM        Cosigned by: Renee Pain, MD at 02/11/2018 12:15 AM     Renee Pain, MD Board Certified by the ABIM, [place] Pager: [phone]  02/11/2018, 7:20 PM

## 2018-02-11 NOTE — Progress Notes (Signed)
PULMONARY / CRITICAL CARE MEDICINE   Name: Briana Sweeney MRN: [id number] DOB: 28-Aug-1971    ADMISSION DATE:  02/10/2018 CONSULTATION DATE:  02/10/18  REFERRING MD:  Hugelmeyer - EDP  CHIEF COMPLAINT:  EtOH withdrawal  HISTORY OF PRESENT ILLNESS:  Pt is encephelopathic; therefore, this HPI is obtained from chart review. Briana Sweeney is a 47 y.o. female with PMH as outlined below including EtOH abuse, polysubstance abuse, cirrhosis, HCV, esophageal varices (confirmed on EGD 12/18), depression, anxiety, bipolar affective disorder, schizophrenia, seizures. She presented to [name] ED 6/4 with tremors, concerns for EtOH withdrawal, and hematemesis that began earlier that day per boyfriend.  Last drink was 1 day prior.She was started on CIWA and despite multiple rounds of ativan, she remained agitated and tremulous.  PCCM was subsequently called for consideration of precedex and ICU admission.  SUBJECTIVE:  No agitation this AM. Still with some tremors. No further hematemesis.   VITAL SIGNS: BP 101/66   Pulse (!) 101   Temp 98.6 F (37 C) (Oral)   Resp (!) 23   Ht 5' (1.524 m)   Wt 57.5 kg (126 lb 12.2 oz)   LMP  (LMP Unknown)   SpO2 94%   BMI 24.76 kg/m   HEMODYNAMICS:    VENTILATOR SETTINGS:    INTAKE / OUTPUT: I/O last 3 completed shifts: In: 1722.7 [I.V.:456.7; Blood:1116; IV Piggyback:150] Out: 250 [Urine:250]   PHYSICAL EXAMINATION:  General: Adult female resting comfortably in bed.  Neuro: Awake, alert, oriented. Sleepy HEENT: [place]/AT. Sclerae anicteric, EOMI.  Dried blood to lips. Cardiovascular: RRR, no MRG.  Lungs: Respirations even and unlabored.  CTA bilaterally, No W/R/R. Abdomen: normoactive BS. Soft, non-tender.  Musculoskeletal: No gross deformities, no edema.  Skin: Grossly intact.  LABS:  BMET Recent Labs  Lab 02/05/18 1016 02/10/18 1443 02/11/18 0019  NA 141 141 141  K 3.8 3.7 3.0*  CL 102 107 107  CO2 19* 22 22  BUN 5* 5* 5*  CREATININE 0.63 0.71  0.74  GLUCOSE 99 133* 105*    Electrolytes Recent Labs  Lab 02/05/18 1016 02/10/18 1443 02/11/18 0019  CALCIUM 8.3* 9.2 8.2*  MG  --  1.8 1.3*  PHOS  --  2.8 2.5    CBC Recent Labs  Lab 02/05/18 1016 02/10/18 1503 02/11/18 0019  WBC 2.8* 1.7* 3.2*  HGB 12.0 11.2* 10.5*  HCT 38.8 35.2* 33.4*  PLT 19* 5* <5*    Coag's Recent Labs  Lab 02/10/18 1503  INR 1.20    Sepsis Markers No results for input(s): LATICACIDVEN, PROCALCITON, O2SATVEN in the last 168 hours.  ABG No results for input(s): PHART, PCO2ART, PO2ART in the last 168 hours.  Liver Enzymes Recent Labs  Lab 02/05/18 1016 02/10/18 1443  AST 57* 73*  ALT 17 24  ALKPHOS 174* 158*  BILITOT 0.5 2.3*  ALBUMIN 3.8 3.4*    Cardiac Enzymes No results for input(s): TROPONINI, PROBNP in the last 168 hours.  Glucose Recent Labs  Lab 02/10/18 2335  GLUCAP 106*    Imaging Dg Chest 2 View  Result Date: 02/10/2018 CLINICAL DATA:  Dyspnea. Shortness of breath with nausea and vomiting for 1 day. EXAM: CHEST - 2 VIEW COMPARISON:  Radiograph 10/22/2017, chest CT 10/25/2017 FINDINGS: The cardiomediastinal contours are normal. Sequela of prior granulomatous disease with calcified granuloma in the left lower lobe and calcified left hilar lymph nodes, unchanged. Pulmonary vasculature is normal. No consolidation, pleural effusion, or pneumothorax. No acute osseous abnormalities are seen. IMPRESSION: 1. No acute  findings. 2. Prior granulomatous disease. Electronically Signed   By: Jeb Levering M.D.   On: 02/10/2018 19:24   Ct Head Wo Contrast  Result Date: 02/10/2018 CLINICAL DATA:  Multiple falls since yesterday. EXAM: CT HEAD WITHOUT CONTRAST TECHNIQUE: Contiguous axial images were obtained from the base of the skull through the vertex without intravenous contrast. COMPARISON:  01/13/2018 FINDINGS: Brain: No evidence of acute infarction, hemorrhage, hydrocephalus, extra-axial collection or mass lesion/mass effect.  Vascular: No hyperdense vessel or unexpected calcification. Skull: Normal. Negative for fracture or focal lesion. Sinuses/Orbits: No acute finding. Other: Left ear metallic object causes streak artifact through the cerebellum and brainstem. IMPRESSION: No acute intracranial abnormality. Electronically Signed   By: Fidela Salisbury M.D.   On: 02/10/2018 19:11     STUDIES:  CT head 6/4 > negative.  CULTURES: None.  ANTIBIOTICS: Ceftriaxone 6/4 >   SIGNIFICANT EVENTS: 6/4 > admit.  LINES/TUBES: None.  DISCUSSION: 47 y.o. female admitted 6/4 with EtOH withdrawal.  Received multiple doses of ativan but remained agitated and tremulous; hence, PCCM called for transfer to ICU.  ASSESSMENT / PLAN:  NEUROLOGIC A:   EtOH withdrawal. Polysubstance abuse - boyfriend states that pt uses cocaine, last use maybe 1 week prior to admit.  UDS this admit only positive for benzos. Hx seizures, migraines, schizophrenia, depression, anxiety, bipolar affective disorder. P:   DC precedex. Hasn't needed and was never started.  PRN Haldol. PRN phenobarbital per CIWA.  Continue preadmission dilantin, IV formulation. Continue preadmission topiramate. Hold preadmission buspirone, chlordiazepoxide, diazepam, fluoxetine, gabapentin, haloperidol, hydroxyzine, lactulose, trazodone.  PULMONARY A: No acute issues. P:   No interventions required.  CARDIOVASCULAR A:  Tachycardia - due to EtOH withdrawal. P:  See neuro section. Holding preadmission furosemide, spironolactone.  RENAL A:   Hypo K Hypomag P:   IVF  Repeat BMP and mag now, replete as needed.   GASTROINTESTINAL A:   Hematemesis - had some blood tinged sputum earlier this evening in ED but none currently.  GI consulted by [facility], awaiting recs. Hx HCV, cirrhosis, esophageal varices (on EGD 12/18 by Dr. Loletha Carrow), GERD. Nutrition. P:   Continue PPI gtt, octreotide gtt. GI has seen. Awaiting recs.  Sips OK  HEMATOLOGIC A:    Thrombocytopenia - chronic; due to bone marrow suppression from EtOH abuse.  S/p 1u platelet transfusion. Leukopenia - chronic. VTE Prophylaxis. P:  Monitor platelet counts. SCD's only. CBC in AM.  INFECTIOUS A:   SBP prophylaxis. P:   Abx as above (ceftriaxone).   ENDOCRINE A: No acute issues. P: No interventions required.  Family updated: Patient updated 6/5. Boyfriend present.   Interdisciplinary Family Meeting v Palliative Care Meeting:  Due by: 02/16/18.  Will plan transfer to tele, [place] for 6/6  Georgann Housekeeper, AGACNP-BC [facility] Pager [phone] or [phone]  02/11/2018 9:10 AM

## 2018-02-12 ENCOUNTER — Encounter (HOSPITAL_COMMUNITY): Payer: Self-pay | Admitting: General Practice

## 2018-02-12 DIAGNOSIS — D696 Thrombocytopenia, unspecified: Secondary | ICD-10-CM | POA: Diagnosis not present

## 2018-02-12 DIAGNOSIS — K920 Hematemesis: Secondary | ICD-10-CM | POA: Diagnosis not present

## 2018-02-12 DIAGNOSIS — D649 Anemia, unspecified: Secondary | ICD-10-CM | POA: Diagnosis not present

## 2018-02-12 DIAGNOSIS — I8510 Secondary esophageal varices without bleeding: Secondary | ICD-10-CM | POA: Diagnosis not present

## 2018-02-12 DIAGNOSIS — F10239 Alcohol dependence with withdrawal, unspecified: Secondary | ICD-10-CM | POA: Diagnosis not present

## 2018-02-12 DIAGNOSIS — D61818 Other pancytopenia: Secondary | ICD-10-CM | POA: Diagnosis not present

## 2018-02-12 DIAGNOSIS — K7031 Alcoholic cirrhosis of liver with ascites: Secondary | ICD-10-CM | POA: Diagnosis not present

## 2018-02-12 DIAGNOSIS — F209 Schizophrenia, unspecified: Secondary | ICD-10-CM

## 2018-02-12 DIAGNOSIS — K7030 Alcoholic cirrhosis of liver without ascites: Secondary | ICD-10-CM | POA: Diagnosis not present

## 2018-02-12 LAB — CBC
HCT: 29.8 % — ABNORMAL LOW (ref 36.0–46.0)
Hemoglobin: 9.3 g/dL — ABNORMAL LOW (ref 12.0–15.0)
MCH: 28.5 pg (ref 26.0–34.0)
MCHC: 31.2 g/dL (ref 30.0–36.0)
MCV: 91.4 fL (ref 78.0–100.0)
Platelets: 23 10*3/uL — CL (ref 150–400)
RBC: 3.26 MIL/uL — ABNORMAL LOW (ref 3.87–5.11)
RDW: 18.5 % — ABNORMAL HIGH (ref 11.5–15.5)
WBC: 4.8 10*3/uL (ref 4.0–10.5)

## 2018-02-12 LAB — PREPARE PLATELET PHERESIS: Unit division: 0

## 2018-02-12 LAB — MAGNESIUM: Magnesium: 1.6 mg/dL — ABNORMAL LOW (ref 1.7–2.4)

## 2018-02-12 LAB — BPAM PLATELET PHERESIS
Blood Product Expiration Date: 201906060233
ISSUE DATE / TIME: 201906050312
Unit Type and Rh: 5100

## 2018-02-12 LAB — BASIC METABOLIC PANEL
Anion gap: 11 (ref 5–15)
BUN: 6 mg/dL (ref 6–20)
CO2: 19 mmol/L — ABNORMAL LOW (ref 22–32)
Calcium: 7.8 mg/dL — ABNORMAL LOW (ref 8.9–10.3)
Chloride: 105 mmol/L (ref 101–111)
Creatinine, Ser: 0.69 mg/dL (ref 0.44–1.00)
GFR calc Af Amer: >60 mL/min (ref >60)
GFR calc non Af Amer: >60 mL/min (ref >60)
Glucose, Bld: 74 mg/dL (ref 65–99)
Potassium: 3.6 mmol/L (ref 3.5–5.1)
Sodium: 135 mmol/L (ref 135–145)

## 2018-02-12 MED ORDER — BOOST / RESOURCE BREEZE PO LIQD CUSTOM
1.0000 | Freq: Three times a day (TID) | ORAL | Status: DC
  Administered 2018-02-12 – 2018-02-14 (×6): 1 via ORAL

## 2018-02-12 NOTE — Progress Notes (Addendum)
Daily Rounding Note  02/12/2018, 10:57 AM  LOS: 2 days   SUBJECTIVE:   Chief complaint: hematemesis  still c/o pain in lower abdomen bil.  No nausea or vomiting today.  Tolerating sips, chips.  Stool watery, brown.  Feels miserable     Maybe a little hungry  She denies chest pain or dyspnea  OBJECTIVE:         Vital signs in last 24 hours:    Temp:  [98.4 F (36.9 C)-99 F (37.2 C)] 98.8 F (37.1 C) (06/06 0525) Pulse Rate:  [76-97] 79 (06/06 0525) Resp:  [16-30] 20 (06/06 0525) BP: (87-126)/(57-90) 126/84 (06/06 0525) SpO2:  [92 %-99 %] 94 % (06/06 0525) Weight:  [131 lb 2.8 oz (59.5 kg)] 131 lb 2.8 oz (59.5 kg) (06/05 1752) Last BM Date: 02/10/18 Filed Weights   02/10/18 1427 02/10/18 2326 02/11/18 1752  Weight: 130 lb (59 kg) 126 lb 12.2 oz (57.5 kg) 131 lb 2.8 oz (59.5 kg)   General: looks jaundiced, ill.  More alert Heart: RRR Chest: clear bil   No dyspnea or cough Abdomen: soft, slightly tender in mid to lower abd.  No guard or rebound  Extremities: no CCE Neuro/Psych:  Alert, improved from yesterday.  Oriented x 3.  No asterixis.    Intake/Output from previous day: 06/05 0701 - 06/06 0700 In: 2745.6 [I.V.:2195.4; IV Piggyback:550.2] Out: 275 [Urine:275]  Intake/Output this shift: No intake/output data recorded.  Lab Results: Recent Labs    02/10/18 1503 02/11/18 0019 02/12/18 0620  WBC 1.7* 3.2* 4.8  HGB 11.2* 10.5* 9.3*  HCT 35.2* 33.4* 29.8*  PLT 5* <5* 23*   BMET Recent Labs    02/10/18 1443 02/11/18 0019 02/11/18 0943  NA 141 141 139  K 3.7 3.0* 4.0  CL 107 107 108  CO2 22 22 23   GLUCOSE 133* 105* 109*  BUN 5* 5* 6  CREATININE 0.71 0.74 0.69  CALCIUM 9.2 8.2* 7.7*   LFT Recent Labs    02/10/18 1443  PROT 8.8*  ALBUMIN 3.4*  AST 73*  ALT 24  ALKPHOS 158*  BILITOT 2.3*   PT/INR Recent Labs    02/10/18 1503  LABPROT 15.1  INR 1.20   Hepatitis Panel No results  for input(s): HEPBSAG, HCVAB, HEPAIGM, HEPBIGM in the last 72 hours.  Studies/Results: Dg Chest 2 View  Result Date: 02/10/2018 CLINICAL DATA:  Dyspnea. Shortness of breath with nausea and vomiting for 1 day. EXAM: CHEST - 2 VIEW COMPARISON:  Radiograph 10/22/2017, chest CT 10/25/2017 FINDINGS: The cardiomediastinal contours are normal. Sequela of prior granulomatous disease with calcified granuloma in the left lower lobe and calcified left hilar lymph nodes, unchanged. Pulmonary vasculature is normal. No consolidation, pleural effusion, or pneumothorax. No acute osseous abnormalities are seen. IMPRESSION: 1. No acute findings. 2. Prior granulomatous disease. Electronically Signed   By: Jeb Levering M.D.   On: 02/10/2018 19:24   Ct Head Wo Contrast  Result Date: 02/10/2018 CLINICAL DATA:  Multiple falls since yesterday. EXAM: CT HEAD WITHOUT CONTRAST TECHNIQUE: Contiguous axial images were obtained from the base of the skull through the vertex without intravenous contrast. COMPARISON:  01/13/2018 FINDINGS: Brain: No evidence of acute infarction, hemorrhage, hydrocephalus, extra-axial collection or mass lesion/mass effect. Vascular: No hyperdense vessel or unexpected calcification. Skull: Normal. Negative for fracture or focal lesion. Sinuses/Orbits: No acute finding. Other: Left ear metallic object causes streak artifact through the cerebellum and brainstem. IMPRESSION: No acute intracranial  abnormality. Electronically Signed   By: Fidela Salisbury M.D.   On: 02/10/2018 19:11    ASSESMENT:   *   CGE.  Known Esophageal varices.  On empiric octreotide.  No BB at home.   On BID oral Protonix.  No PPI or h2 blocker at home per med list.    *   Normocytic anemia.  Hgb 10.5 >> 9.3.  Previous levels of 10 to 12.    *   ESLD.   Cirrhosis. Ongoing ETOH abuse.    *  Pancytopenia.  Platelets <5 now up to 23 K.  S/p 2 U Platelets 6/4 - 6/5.        PLAN   *  Stop Octreotide when current bag  finishes.  Continue BID oral PPI Complete 5 days of Rocephin, currently day 2.   Start clears.   ? Initiate low dose inderal?     Azucena Freed  02/12/2018, 10:57 AM Phone [phone]  I have discussed the case with the PA, and that is the plan I formulated. I personally interviewed and examined the patient.  I spoke with her at length, and then also spoke with her boyfriend when he arrived partway through the visit.  He asked how much alcohol she could reasonably have, "just 1 or 2?"  I have told Katlin that she does not appear to be having ongoing GI bleeding at this point.  I have no plans for endoscopic procedures as I think there would be very high risk, especially with her platelet count so low.  I do not think she needs additional platelet transfusion unless she has brisk GI bleeding.  Platelets will only be sequestered in the spleen and not give a lasting increase in the platelet count.  Her long-term prognosis remains quite poor, even if she manages to stop using alcohol.  I have told her in no uncertain terms that she should be completely abstinent from all alcohol and drugs.  Her boyfriend was asking about the possibility of liver transplant, and I explained to her that she is not currently a candidate for transplant consideration with ongoing alcohol use.  Octreotide was discontinued, and regular diet begun.  Do not start nonselective beta-blocker as she has been intermittently hypotensive.  She had no ascites on recent imaging, no chest x-ray findings of pneumonia, so I have discontinued her antibiotics.  If there is no further overt GI bleeding, she can be discharged anytime from a GI standpoint.  Nelida Meuse III Office: [phone]

## 2018-02-12 NOTE — Progress Notes (Signed)
Patient ID: Briana Sweeney, female   DOB: May 11, 1971, 47 y.o.   MRN: [id number]  PROGRESS NOTE    Briana Sweeney  QHU:[id number] DOB: 1971-01-18 DOA: 02/10/2018 PCP: [name], FNP   Brief Narrative: Briana Sweeney is a 47 y.o. female with PMH as outlined below including EtOH abuse, polysubstance abuse, cirrhosis, HCV, esophageal varices (confirmed on EGD 12/18), depression, anxiety, bipolar affective disorder, schizophrenia, seizures. She presented to [facility] ED 6/4 with tremors, concerns for EtOH withdrawal, and hematemesis that began earlier that day per boyfriend.  Last drink was 1 day prior.She was started on CIWA and despite multiple rounds of ativan, she remained agitated and tremulous.  PCCM initially admitted and she was briefly on Precedex which is been weaned off.  She has had no further bleeding since last night.     Assessment & Plan:   Principal Problem:   Hematemesis Active Problems:   Alcoholic cirrhosis of liver with ascites (HCC)   Schizophrenia (HCC)   Thrombocytopenia (HCC)   Alcohol withdrawal, with unspecified complication (HCC)   Hematemesis from upper GI bleed likely from esophageal varices-appreciate GI help.  Patient seems to have stopped the bleeding as of last night.  Hemoglobin is stable.  On octreotide and PPI drip.  Management per GI team.  Hemoglobin 9.3 from 10.5 yesterday.  Alcohol withdrawal-seems to have resolved at this time she has been stable off Precedex continue CIWA protocol.  Alcoholic cirrhosis of the liver-patient is still drinking alcohol routinely.  She has been advised if she continues to drink she will most definitely continue to get worse.  Schizophrenia-appears stable   thrombocytopenia-platelets improved today to 23   DVT prophylaxis: SCDs  Code Status: Full  Family Communication: Boyfriend  Disposition Plan: Per GI   Consultants:   GI    Antimicrobials:   Rocephin   Subjective: Patient feeling much better.  She quit  vomiting last night has not had any further hematemesis.  Objective: Vitals:   02/11/18 1700 02/11/18 1752 02/11/18 2155 02/12/18 0525  BP: 109/67 123/89 123/78 126/84  Pulse: 97 88 76 79  Resp: (!) 28 20 16 20   Temp:  99 F (37.2 C) 98.8 F (37.1 C) 98.8 F (37.1 C)  TempSrc:  Oral    SpO2: 98% 95% 99% 94%  Weight:  59.5 kg (131 lb 2.8 oz)    Height:  5' (1.524 m)      Intake/Output Summary (Last 24 hours) at 02/12/2018 1009 Last data filed at 02/12/2018 0954 Gross per 24 hour  Intake 2033.11 ml  Output -  Net 2033.11 ml   Filed Weights   02/10/18 1427 02/10/18 2326 02/11/18 1752  Weight: 59 kg (130 lb) 57.5 kg (126 lb 12.2 oz) 59.5 kg (131 lb 2.8 oz)    Examination:  General exam: Appears calm and comfortable  Respiratory system: Clear to auscultation. Respiratory effort normal. Cardiovascular system: S1 & S2 heard, RRR. No JVD, murmurs, rubs, gallops or clicks. No pedal edema. Gastrointestinal system: Abdomen is nondistended, soft and nontender. No organomegaly or masses felt. Normal bowel sounds heard. Central nervous system: Alert and oriented. No focal neurological deficits. Extremities: Symmetric 5 x 5 power. Skin: No rashes, lesions or ulcers Psychiatry: Judgement and insight appear normal. Mood & affect appropriate.     Data Reviewed: I have personally reviewed following labs and imaging studies  CBC: Recent Labs  Lab 02/05/18 1016 02/10/18 1503 02/11/18 0019 02/12/18 0620  WBC 2.8* 1.7* 3.2* 4.8  NEUTROABS 0.9* 1.0*  --   --  HGB 12.0 11.2* 10.5* 9.3*  HCT 38.8 35.2* 33.4* 29.8*  MCV 91 88.7 90.3 91.4  PLT 19* 5* <5* 23*   Basic Metabolic Panel: Recent Labs  Lab 02/05/18 1016 02/10/18 1443 02/11/18 0019 02/11/18 0943  NA 141 141 141 139  K 3.8 3.7 3.0* 4.0  CL 102 107 107 108  CO2 19* 22 22 23   GLUCOSE 99 133* 105* 109*  BUN 5* 5* 5* 6  CREATININE 0.63 0.71 0.74 0.69  CALCIUM 8.3* 9.2 8.2* 7.7*  MG  --  1.8 1.3* 2.1  PHOS  --  2.8 2.5   --    GFR: Estimated Creatinine Clearance: 71.6 mL/min (by C-G formula based on SCr of 0.69 mg/dL). Liver Function Tests: Recent Labs  Lab 02/05/18 1016 02/10/18 1443  AST 57* 73*  ALT 17 24  ALKPHOS 174* 158*  BILITOT 0.5 2.3*  PROT 8.2 8.8*  ALBUMIN 3.8 3.4*   No results for input(s): LIPASE, AMYLASE in the last 168 hours. No results for input(s): AMMONIA in the last 168 hours. Coagulation Profile: Recent Labs  Lab 02/10/18 1503  INR 1.20   Cardiac Enzymes: No results for input(s): CKTOTAL, CKMB, CKMBINDEX, TROPONINI in the last 168 hours. BNP (last 3 results) No results for input(s): PROBNP in the last 8760 hours. HbA1C: No results for input(s): HGBA1C in the last 72 hours. CBG: Recent Labs  Lab 02/10/18 2335  GLUCAP 106*   Lipid Profile: No results for input(s): CHOL, HDL, LDLCALC, TRIG, CHOLHDL, LDLDIRECT in the last 72 hours. Thyroid Function Tests: No results for input(s): TSH, T4TOTAL, FREET4, T3FREE, THYROIDAB in the last 72 hours. Anemia Panel: No results for input(s): VITAMINB12, FOLATE, FERRITIN, TIBC, IRON, RETICCTPCT in the last 72 hours. Sepsis Labs: No results for input(s): PROCALCITON, LATICACIDVEN in the last 168 hours.  Recent Results (from the past 240 hour(s))  MRSA PCR Screening     Status: None   Collection Time: 02/10/18 11:22 PM  Result Value Ref Range Status   MRSA by PCR NEGATIVE NEGATIVE Final    Comment:        The GeneXpert MRSA Assay (FDA approved for NASAL specimens only), is one component of a comprehensive MRSA colonization surveillance program. It is not intended to diagnose MRSA infection nor to guide or monitor treatment for MRSA infections. Performed at [facility], [place] [address], [place], [place] [address]          Radiology Studies: Dg Chest 2 View  Result Date: 02/10/2018 CLINICAL DATA:  Dyspnea. Shortness of breath with nausea and vomiting for 1 day. EXAM: CHEST - 2 VIEW COMPARISON:  Radiograph  10/22/2017, chest CT 10/25/2017 FINDINGS: The cardiomediastinal contours are normal. Sequela of prior granulomatous disease with calcified granuloma in the left lower lobe and calcified left hilar lymph nodes, unchanged. Pulmonary vasculature is normal. No consolidation, pleural effusion, or pneumothorax. No acute osseous abnormalities are seen. IMPRESSION: 1. No acute findings. 2. Prior granulomatous disease. Electronically Signed   By: Jeb Levering M.D.   On: 02/10/2018 19:24   Ct Head Wo Contrast  Result Date: 02/10/2018 CLINICAL DATA:  Multiple falls since yesterday. EXAM: CT HEAD WITHOUT CONTRAST TECHNIQUE: Contiguous axial images were obtained from the base of the skull through the vertex without intravenous contrast. COMPARISON:  01/13/2018 FINDINGS: Brain: No evidence of acute infarction, hemorrhage, hydrocephalus, extra-axial collection or mass lesion/mass effect. Vascular: No hyperdense vessel or unexpected calcification. Skull: Normal. Negative for fracture or focal lesion. Sinuses/Orbits: No acute finding. Other:  Left ear metallic object causes streak artifact through the cerebellum and brainstem. IMPRESSION: No acute intracranial abnormality. Electronically Signed   By: Fidela Salisbury M.D.   On: 02/10/2018 19:11        Scheduled Meds: . busPIRone  10 mg Oral BID  . diazepam  5 mg Oral Daily  . FLUoxetine  20 mg Oral QHS  . pantoprazole  40 mg Oral BID  . phenytoin (DILANTIN) IV  100 mg Intravenous Q8H  . thiamine injection  100 mg Intravenous Daily  . topiramate  50 mg Oral Daily   Continuous Infusions: . sodium chloride 75 mL/hr at 02/12/18 0957  . cefTRIAXone (ROCEPHIN)  IV Stopped (02/11/18 2245)  . octreotide  (SANDOSTATIN)    IV infusion 50 mcg/hr (02/12/18 0522)     LOS: 2 days    Time spent: 40 minutes    Victor Langenbach A, MD [facility] Pager [phone]  If 7PM-7AM, please contact night-coverage [email or web address] Password [email or web address] 02/12/2018, 10:09 AM

## 2018-02-12 NOTE — Progress Notes (Signed)
Initial Nutrition Assessment  DOCUMENTATION CODES:   Not applicable  INTERVENTION:  Boost Breeze po TID, each supplement provides 250 kcal and 9 grams of protein  Continue Thiamine 100mg , 1mg  folic acid, MVI w/ Minerals daily  NUTRITION DIAGNOSIS:   Inadequate oral intake related to chronic illness(Alcholism) as evidenced by per patient/family report.  GOAL:   Patient will meet greater than or equal to 90% of their needs  MONITOR:   Supplement acceptance, I & O's, Diet advancement  REASON FOR ASSESSMENT:   Malnutrition Screening Tool    ASSESSMENT:   Briana Sweeney is an alcoholic with a history of polysubstance abuse, cirrhosis, documented esophageal varices, or seizure disorder.  Admitted 6/4 with evidence for alcohol withdrawal and reported hematemesis  Drawn to patient for positive MST. Spoke with patient at bedside. She reports poor food intake recently. States she is mostly picking at food, but is usually drinking alcohol and does not eat much when she drinks. Reports a UBW of 130 pounds, states she got down to 120 pounds at one point but has since gained it back. Diet now advanced, on clear liquids. Does not have much of an appetite.  Will monitor Monitor PO intake and supplement acceptance.  Labs reviewed:  Mg 1.6  Medications reviewed and include:  NS at 71mL/hr  NUTRITION - FOCUSED PHYSICAL EXAM:    Most Recent Value  Orbital Region  Mild depletion  Upper Arm Region  No depletion  Thoracic and Lumbar Region  No depletion  Buccal Region  Mild depletion  Temple Region  No depletion  Clavicle Bone Region  No depletion  Clavicle and Acromion Bone Region  No depletion  Scapular Bone Region  Mild depletion  Dorsal Hand  No depletion  Patellar Region  No depletion  Anterior Thigh Region  No depletion  Posterior Calf Region  No depletion       Diet Order:   Diet Order           Diet clear liquid Room service appropriate? Yes; Fluid consistency: Thin   Diet effective now          EDUCATION NEEDS:   Not appropriate for education at this time  Skin:  Skin Assessment: Reviewed RN Assessment(Ecchymosis to shoulder, hip, buttocks,  leg)  Last BM:  02/10/2018  Height:   Ht Readings from Last 1 Encounters:  02/11/18 5' (1.524 m)    Weight:   Wt Readings from Last 1 Encounters:  02/11/18 131 lb 2.8 oz (59.5 kg)    Ideal Body Weight:  45.45 kg  BMI:  Body mass index is 25.62 kg/m.  Estimated Nutritional Needs:   Kcal:  1500-1800 calories  Protein:  77-89 grams (1.3-1.5g/kg)  Fluid:  1.5-1.8L  Briana Anis. Cellie Dardis, MS, RD LDN Inpatient Clinical Dietitian Pager [phone]

## 2018-02-13 DIAGNOSIS — K920 Hematemesis: Secondary | ICD-10-CM | POA: Diagnosis not present

## 2018-02-13 DIAGNOSIS — F10239 Alcohol dependence with withdrawal, unspecified: Secondary | ICD-10-CM | POA: Diagnosis not present

## 2018-02-13 DIAGNOSIS — K922 Gastrointestinal hemorrhage, unspecified: Secondary | ICD-10-CM

## 2018-02-13 DIAGNOSIS — F209 Schizophrenia, unspecified: Secondary | ICD-10-CM | POA: Diagnosis not present

## 2018-02-13 DIAGNOSIS — K7031 Alcoholic cirrhosis of liver with ascites: Secondary | ICD-10-CM | POA: Diagnosis not present

## 2018-02-13 LAB — CBC
HCT: 31.8 % — ABNORMAL LOW (ref 36.0–46.0)
Hemoglobin: 10.1 g/dL — ABNORMAL LOW (ref 12.0–15.0)
MCH: 28.6 pg (ref 26.0–34.0)
MCHC: 31.8 g/dL (ref 30.0–36.0)
MCV: 90.1 fL (ref 78.0–100.0)
Platelets: 21 10*3/uL — CL (ref 150–400)
RBC: 3.53 MIL/uL — ABNORMAL LOW (ref 3.87–5.11)
RDW: 18.4 % — ABNORMAL HIGH (ref 11.5–15.5)
WBC: 2.9 10*3/uL — ABNORMAL LOW (ref 4.0–10.5)

## 2018-02-13 LAB — BASIC METABOLIC PANEL
Anion gap: 7 (ref 5–15)
BUN: 7 mg/dL (ref 6–20)
CO2: 23 mmol/L (ref 22–32)
Calcium: 8.3 mg/dL — ABNORMAL LOW (ref 8.9–10.3)
Chloride: 107 mmol/L (ref 101–111)
Creatinine, Ser: 0.63 mg/dL (ref 0.44–1.00)
GFR calc Af Amer: >60 mL/min (ref >60)
GFR calc non Af Amer: >60 mL/min (ref >60)
Glucose, Bld: 157 mg/dL — ABNORMAL HIGH (ref 65–99)
Potassium: 3.2 mmol/L — ABNORMAL LOW (ref 3.5–5.1)
Sodium: 137 mmol/L (ref 135–145)

## 2018-02-13 MED ORDER — CHLORDIAZEPOXIDE HCL 25 MG PO CAPS
25.0000 mg | ORAL_CAPSULE | Freq: Three times a day (TID) | ORAL | Status: DC | PRN
Start: 2018-02-13 — End: 2018-02-14
  Administered 2018-02-13 – 2018-02-14 (×2): 25 mg via ORAL
  Filled 2018-02-13 (×2): qty 1

## 2018-02-13 MED ORDER — THIAMINE HCL 100 MG/ML IJ SOLN: 100.0000 mg | Freq: Every day | INTRAMUSCULAR | Status: DC

## 2018-02-13 MED ORDER — VITAMIN B-1 100 MG PO TABS
100.0000 mg | ORAL_TABLET | Freq: Every day | ORAL | Status: DC
  Administered 2018-02-14: 100 mg via ORAL
  Filled 2018-02-13: qty 1

## 2018-02-13 MED ORDER — LORAZEPAM 2 MG/ML IJ SOLN
2.0000 mg | Freq: Once | INTRAMUSCULAR | Status: AC
  Administered 2018-02-13: 2 mg via INTRAVENOUS
  Filled 2018-02-13: qty 1

## 2018-02-13 MED ORDER — PANTOPRAZOLE SODIUM 40 MG PO TBEC
40.0000 mg | DELAYED_RELEASE_TABLET | Freq: Every day | ORAL | Status: DC
  Administered 2018-02-14: 40 mg via ORAL
  Filled 2018-02-13: qty 1

## 2018-02-13 MED ORDER — VITAMIN B-1 100 MG PO TABS
100.0000 mg | ORAL_TABLET | Freq: Every day | ORAL | Status: DC
  Administered 2018-02-13: 100 mg via ORAL
  Filled 2018-02-13: qty 1

## 2018-02-13 MED ORDER — ADULT MULTIVITAMIN W/MINERALS CH
1.0000 | ORAL_TABLET | Freq: Every day | ORAL | Status: DC
  Administered 2018-02-13 – 2018-02-14 (×2): 1 via ORAL
  Filled 2018-02-13 (×2): qty 1

## 2018-02-13 MED ORDER — FOLIC ACID 1 MG PO TABS
1.0000 mg | ORAL_TABLET | Freq: Every day | ORAL | Status: DC
  Administered 2018-02-13 – 2018-02-14 (×2): 1 mg via ORAL
  Filled 2018-02-13 (×2): qty 1

## 2018-02-13 MED ORDER — PHENYTOIN SODIUM EXTENDED 100 MG PO CAPS
300.0000 mg | ORAL_CAPSULE | Freq: Every day | ORAL | Status: DC
  Administered 2018-02-13: 300 mg via ORAL
  Filled 2018-02-13: qty 3

## 2018-02-13 NOTE — Progress Notes (Signed)
Physical Therapy Treatment Patient Details Name: Briana Sweeney MRN: [id number] DOB: March 18, 1971 Today's Date: 02/13/2018    History of Present Illness 47 y.o. female with PMH as outlined below including EtOH abuse, polysubstance abuse, cirrhosis, HCV, esophageal varices (confirmed on EGD 12/18), depression, anxiety, bipolar affective disorder, schizophrenia, seizures. She presented to [facility] ED 6/4 with tremors, concerns for EtOH withdrawal, and hematemesis that began earlier that day per boyfriend.    PT Comments    Patient progressing with therapy, ambulating into hallway with min guard and poor mechanics/balance. Patient unsafe to ambulate by herself at this time, discussed with patient and encouraged use of RW for short term although she states preference not to use it. Walked 150', SpO2 and HR stable on RA. Pt reports feeling nervous, agreeable to work on balance with HHPT once medially ready for return to home. Pt verbalizes that she will have neighbors/bf/or friends with her with OOB mobility at home, as she is at increased risk of falling.     Follow Up Recommendations  Home health PT;Supervision for mobility/OOB     Equipment Recommendations  Rolling walker with 5" wheels    Recommendations for Other Services OT consult     Precautions / Restrictions Precautions Precautions: Fall Restrictions Weight Bearing Restrictions: No    Mobility  Bed Mobility Overal bed mobility: Independent                Transfers Overall transfer level: Needs assistance Equipment used: 1 person hand held assist Transfers: Sit to/from Stand Sit to Stand: Min guard            Ambulation/Gait Ambulation/Gait assistance: Min guard Ambulation Distance (Feet): 150 Feet Assistive device: 1 person hand held assist Gait Pattern/deviations: Step-to pattern;Decreased stride length Gait velocity: decreased   General Gait Details: patient states fear of falling, ambulates with min guard at  this time for safety, with unsteady, narrow base of support, shortened step length gait. hand held assist for first 57' to gain confidence   Stairs             Wheelchair Mobility    Modified Rankin (Stroke Patients Only)       Balance Overall balance assessment: Needs assistance Sitting-balance support: Feet unsupported;No upper extremity supported Sitting balance-Leahy Scale: Fair     Standing balance support: Single extremity supported Standing balance-Leahy Scale: Poor                              Cognition Arousal/Alertness: Awake/alert Behavior During Therapy: Flat affect Overall Cognitive Status: History of cognitive impairments - at baseline                                 General Comments: A & O x4, slow processing      Exercises      General Comments        Pertinent Vitals/Pain Pain Assessment: Faces Faces Pain Scale: Hurts even more Pain Location: R side pain, headache Pain Descriptors / Indicators: Headache;Aching;Sore Pain Intervention(s): Limited activity within patient's tolerance;Monitored during session    Home Living                      Prior Function            PT Goals (current goals can now be found in the care plan section) Acute Rehab PT Goals Patient Stated  Goal: feel better PT Goal Formulation: With patient Time For Goal Achievement: 02/25/18 Potential to Achieve Goals: Fair Progress towards PT goals: Progressing toward goals    Frequency    Min 3X/week      PT Plan Current plan remains appropriate    Co-evaluation              AM-PAC PT "6 Clicks" Daily Activity  Outcome Measure  Difficulty turning over in bed (including adjusting bedclothes, sheets and blankets)?: A Little Difficulty moving from lying on back to sitting on the side of the bed? : A Little Difficulty sitting down on and standing up from a chair with arms (e.g., wheelchair, bedside commode, etc,.)?: A  Little Help needed moving to and from a bed to chair (including a wheelchair)?: A Little Help needed walking in hospital room?: A Little Help needed climbing 3-5 steps with a railing? : A Lot 6 Click Score: 17    End of Session Equipment Utilized During Treatment: Gait belt Activity Tolerance: Patient limited by fatigue Patient left: in bed;with call bell/phone within reach;with bed alarm set Nurse Communication: Mobility status PT Visit Diagnosis: Unsteadiness on feet (R26.81);Repeated falls (R29.6);History of falling (Z91.81);Other abnormalities of gait and mobility (R26.89);Difficulty in walking, not elsewhere classified (R26.2);Pain Pain - part of body: (R Rib, headache)     Time: 9147-8295 PT Time Calculation (min) (ACUTE ONLY): 20 min  Charges:  $Gait Training: 8-22 mins                    G Codes:       Reinaldo Berber, PT, DPT Acute Rehab Services Pager: [phone]     Reinaldo Berber 02/13/2018, 9:46 AM

## 2018-02-13 NOTE — Care Management Note (Addendum)
Case Management Note  Patient Details  Name: Catheline Hixon MRN: [id number] Date of Birth: [birth year]  Subjective/Objective:                 Spoke w patient at bedside. She is active w Dr Clovis Riley at the [facility]. She states she uses the [facility] pharmacy. No needs at this time. Had [facility] 07/28/17   Action/Plan:  6/8 Provided with Good Rx coupons for omnicef.   Expected Discharge Date:                  Expected Discharge Plan:  Home/Self Care  In-House Referral:     Discharge planning Services  CM Consult  Post Acute Care Choice:    Choice offered to:     DME Arranged:    DME Agency:     HH Arranged:    HH Agency:     Status of Service:  In process, will continue to follow  If discussed at Long Length of Stay Meetings, dates discussed:    Additional Comments:  Carles Collet, RN 02/13/2018, 2:50 PM

## 2018-02-13 NOTE — Progress Notes (Signed)
Patient ID: Briana Sweeney, female   DOB: 20-Jul-1971, 47 y.o.   MRN: [id number]  PROGRESS NOTE    Briana Sweeney  WLN:[id number] DOB: 07/14/1971 DOA: 02/10/2018 PCP: [name], FNP   Brief Narrative: Briana Sweeney is a 47 y.o. female with PMH as outlined below including EtOH abuse, polysubstance abuse, cirrhosis, HCV, esophageal varices (confirmed on EGD 12/18), depression, anxiety, bipolar affective disorder, schizophrenia, seizures. She presented to [facility] ED 6/4 with tremors, concerns for EtOH withdrawal, and hematemesis that began earlier that day per boyfriend.  Last drink was 1 day prior.She was started on CIWA and despite multiple rounds of ativan, she remained agitated and tremulous.  PCCM initially admitted and she was briefly on Precedex which is been weaned off.  She has had no further bleeding since 02/11/2018.     Assessment & Plan:   Principal Problem:   Hematemesis Active Problems:   Alcoholic cirrhosis of liver with ascites (HCC)   Schizophrenia (HCC)   Thrombocytopenia (HCC)   Alcohol withdrawal, with unspecified complication (HCC)   Hematemesis from upper GI bleed likely from esophageal varices-appreciate GI help.  Patient seems to have stopped the bleeding as of 01/2018.  Hemoglobin is stable.  Octreotide and Protonix drip stopped yesterday.  Further treatment per GI team.  Globin stable.   Alcohol withdrawal-withdrawal symptoms and tremors seem to have worsened this morning.  She is only on Librium 10 mg 3 times a day and can increase this to 25 mg 3 times a day.  She is on no Ativan.  We will place her on Ativan.  Stop phenobarbital.  Stop her Haldol.  No hallucinations at this time.  Alcoholic cirrhosis of the liver-patient is still drinking alcohol routinely.  She has been advised if she continues to drink she will most definitely continue to get worse.  Schizophrenia-appears stable   thrombocytopenia-platelets improved  to over 20.   DVT prophylaxis: SCDs  Code  Status: Full  Family Communication: Boyfriend  Disposition Plan: Discharge once clearly not in any withdrawal.  Consultants:   GI    Antimicrobials:   Rocephin   Subjective: Patient feels more shaky this morning.  Anxious.  With tremor.  She did not have a tremor yesterday nor the day before my exam and nothing mentioned in nursing notes for the last several days.  Objective: Vitals:   02/12/18 2201 02/13/18 0525 02/13/18 0949 02/13/18 1042  BP: 123/80 (!) 127/93  (!) 128/96  Pulse: 87 75  75  Resp: 18 18  12   Temp: 98.6 F (37 C) 98.9 F (37.2 C) 98.1 F (36.7 C) 98.1 F (36.7 C)  TempSrc: Oral  Oral Oral  SpO2: 97% 97%  96%  Weight:      Height:        Intake/Output Summary (Last 24 hours) at 02/13/2018 1535 Last data filed at 02/12/2018 2212 Gross per 24 hour  Intake 480 ml  Output -  Net 480 ml   Filed Weights   02/10/18 1427 02/10/18 2326 02/11/18 1752  Weight: 59 kg (130 lb) 57.5 kg (126 lb 12.2 oz) 59.5 kg (131 lb 2.8 oz)    Examination:  General exam: Appears anxious with asterixis mentation is normal Respiratory system: Clear to auscultation. Respiratory effort normal. Cardiovascular system: S1 & S2 heard, RRR. No JVD, murmurs, rubs, gallops or clicks. No pedal edema. Gastrointestinal system: Abdomen is nondistended, soft and nontender. No organomegaly or masses felt. Normal bowel sounds heard. Central nervous system: Alert and oriented. No focal  neurological deficits. Extremities: Symmetric 5 x 5 power. Skin: No rashes, lesions or ulcers Psychiatry: Judgement and insight appear normal. Mood & affect appropriate.     Data Reviewed: I have personally reviewed following labs and imaging studies  CBC: Recent Labs  Lab 02/10/18 1503 02/11/18 0019 02/12/18 0620 02/13/18 1031  WBC 1.7* 3.2* 4.8 2.9*  NEUTROABS 1.0*  --   --   --   HGB 11.2* 10.5* 9.3* 10.1*  HCT 35.2* 33.4* 29.8* 31.8*  MCV 88.7 90.3 91.4 90.1  PLT 5* <5* 23* 21*   Basic  Metabolic Panel: Recent Labs  Lab 02/10/18 1443 02/11/18 0019 02/11/18 0943 02/12/18 0620 02/13/18 1031  NA 141 141 139 135 137  K 3.7 3.0* 4.0 3.6 3.2*  CL 107 107 108 105 107  CO2 22 22 23  19* 23  GLUCOSE 133* 105* 109* 74 157*  BUN 5* 5* 6 6 7   CREATININE 0.71 0.74 0.69 0.69 0.63  CALCIUM 9.2 8.2* 7.7* 7.8* 8.3*  MG 1.8 1.3* 2.1 1.6*  --   PHOS 2.8 2.5  --   --   --    GFR: Estimated Creatinine Clearance: 71.6 mL/min (by C-G formula based on SCr of 0.63 mg/dL). Liver Function Tests: Recent Labs  Lab 02/10/18 1443  AST 73*  ALT 24  ALKPHOS 158*  BILITOT 2.3*  PROT 8.8*  ALBUMIN 3.4*   No results for input(s): LIPASE, AMYLASE in the last 168 hours. No results for input(s): AMMONIA in the last 168 hours. Coagulation Profile: Recent Labs  Lab 02/10/18 1503  INR 1.20   Cardiac Enzymes: No results for input(s): CKTOTAL, CKMB, CKMBINDEX, TROPONINI in the last 168 hours. BNP (last 3 results) No results for input(s): PROBNP in the last 8760 hours. HbA1C: No results for input(s): HGBA1C in the last 72 hours. CBG: Recent Labs  Lab 02/10/18 2335  GLUCAP 106*   Lipid Profile: No results for input(s): CHOL, HDL, LDLCALC, TRIG, CHOLHDL, LDLDIRECT in the last 72 hours. Thyroid Function Tests: No results for input(s): TSH, T4TOTAL, FREET4, T3FREE, THYROIDAB in the last 72 hours. Anemia Panel: No results for input(s): VITAMINB12, FOLATE, FERRITIN, TIBC, IRON, RETICCTPCT in the last 72 hours. Sepsis Labs: No results for input(s): PROCALCITON, LATICACIDVEN in the last 168 hours.  Recent Results (from the past 240 hour(s))  MRSA PCR Screening     Status: None   Collection Time: 02/10/18 11:22 PM  Result Value Ref Range Status   MRSA by PCR NEGATIVE NEGATIVE Final    Comment:        The GeneXpert MRSA Assay (FDA approved for NASAL specimens only), is one component of a comprehensive MRSA colonization surveillance program. It is not intended to diagnose  MRSA infection nor to guide or monitor treatment for MRSA infections. Performed at [facility], [place] [address] [place], [place] [address]          Radiology Studies: No results found.      Scheduled Meds: . busPIRone  10 mg Oral BID  . diazepam  5 mg Oral Daily  . feeding supplement  1 Container Oral TID BM  . FLUoxetine  20 mg Oral QHS  . folic acid  1 mg Oral Daily  . multivitamin with minerals  1 tablet Oral Daily  . [START ON 02/14/2018] pantoprazole  40 mg Oral Q breakfast  . phenytoin  300 mg Oral QHS  . thiamine  100 mg Oral Daily   Or  . thiamine  100 mg  Intravenous Daily  . topiramate  50 mg Oral Daily   Continuous Infusions:    LOS: 3 days    Time spent: 41 minutes    Teyton Pattillo A, MD [facility] Pager [phone]  If 7PM-7AM, please contact night-coverage [email or web address] Password [email or web address] 02/13/2018, 3:35 PM

## 2018-02-14 DIAGNOSIS — D61818 Other pancytopenia: Secondary | ICD-10-CM | POA: Diagnosis not present

## 2018-02-14 DIAGNOSIS — F209 Schizophrenia, unspecified: Secondary | ICD-10-CM

## 2018-02-14 DIAGNOSIS — K920 Hematemesis: Secondary | ICD-10-CM | POA: Diagnosis not present

## 2018-02-14 DIAGNOSIS — F10239 Alcohol dependence with withdrawal, unspecified: Secondary | ICD-10-CM | POA: Diagnosis not present

## 2018-02-14 LAB — BASIC METABOLIC PANEL
Anion gap: 8 (ref 5–15)
BUN: <5 mg/dL — ABNORMAL LOW (ref 6–20)
CO2: 21 mmol/L — ABNORMAL LOW (ref 22–32)
Calcium: 8.4 mg/dL — ABNORMAL LOW (ref 8.9–10.3)
Chloride: 108 mmol/L (ref 101–111)
Creatinine, Ser: 0.52 mg/dL (ref 0.44–1.00)
GFR calc Af Amer: >60 mL/min (ref >60)
GFR calc non Af Amer: >60 mL/min (ref >60)
Glucose, Bld: 127 mg/dL — ABNORMAL HIGH (ref 65–99)
Potassium: 3.3 mmol/L — ABNORMAL LOW (ref 3.5–5.1)
Sodium: 137 mmol/L (ref 135–145)

## 2018-02-14 LAB — CBC
HCT: 34.6 % — ABNORMAL LOW (ref 36.0–46.0)
Hemoglobin: 10.9 g/dL — ABNORMAL LOW (ref 12.0–15.0)
MCH: 28.2 pg (ref 26.0–34.0)
MCHC: 31.5 g/dL (ref 30.0–36.0)
MCV: 89.6 fL (ref 78.0–100.0)
Platelets: 20 10*3/uL — CL (ref 150–400)
RBC: 3.86 MIL/uL — ABNORMAL LOW (ref 3.87–5.11)
RDW: 18.9 % — ABNORMAL HIGH (ref 11.5–15.5)
WBC: 2.9 10*3/uL — ABNORMAL LOW (ref 4.0–10.5)

## 2018-02-14 MED ORDER — PANTOPRAZOLE SODIUM 40 MG PO TBEC: 40.0000 mg | DELAYED_RELEASE_TABLET | Freq: Every day | ORAL | 0 refills | Status: DC

## 2018-02-14 MED ORDER — CHLORDIAZEPOXIDE HCL 25 MG PO CAPS: 25.0000 mg | ORAL_CAPSULE | Freq: Three times a day (TID) | ORAL | 0 refills | Status: DC | PRN

## 2018-02-14 MED ORDER — CEFDINIR 300 MG PO CAPS: 300.0000 mg | ORAL_CAPSULE | Freq: Two times a day (BID) | ORAL | 0 refills | Status: DC

## 2018-02-14 MED ORDER — FOLIC ACID 1 MG PO TABS: 1.0000 mg | ORAL_TABLET | Freq: Every day | ORAL | 0 refills | Status: DC

## 2018-02-14 MED ORDER — THIAMINE HCL 100 MG PO TABS: 100.0000 mg | ORAL_TABLET | Freq: Every day | ORAL | Status: DC

## 2018-02-14 NOTE — Discharge Summary (Signed)
Physician Discharge Summary  Briana Sweeney RSW:[id number] DOB: 29-Feb-1971 DOA: 02/10/2018  PCP: [name], FNP  Admit date: 02/10/2018 Discharge date: 02/14/2018  Time spent: 45 minutes  Recommendations for Outpatient Follow-up:  1. Follow-up PCP in 1 week  Discharge Diagnoses:  Principal Problem:   Hematemesis Active Problems:   Alcoholic cirrhosis of liver with ascites (HCC)   Schizophrenia (HCC)   Thrombocytopenia (HCC)   Alcohol withdrawal, with unspecified complication [facility])   Discharge Condition: Stable and improved condition  Diet recommendation: Cardiac diet  Filed Weights   02/10/18 1427 02/10/18 2326 02/11/18 1752  Weight: 59 kg (130 lb) 57.5 kg (126 lb 12.2 oz) 59.5 kg (131 lb 2.8 oz)    History of present illness:  Briana Dunlapis a 47 y.o.femalewith PMH as outlined below including EtOH abuse, polysubstance abuse, cirrhosis, HCV, esophageal varices (confirmed on EGD 12/18), depression, anxiety, bipolar affective disorder, schizophrenia, seizures. She presented to [facility] ED 6/4 with tremors, concerns for EtOH withdrawal, and hematemesis that began earlier that day per boyfriend. Last drink was 1 day prior.She was started on CIWA and despite multiple rounds of ativan, she remained agitated and tremulous. PCCM initially admitted and she was briefly on Precedex which is been weaned off.  She has had no further bleeding since 02/11/2018.  She is being been managed with Librium which is been working well for her.  She is motivated to stop drinking.  Patient is being discharged to follow-up with her primary care physician approximately 1 week.  She has been highly educated along with her husband about her alcohol abuse and the need to quit totally.  She is instructed to return to the emergency department if any further bleeding occurs.    Hospital Course:  Hematemesis from upper GI bleed likely from esophageal varices-appreciate GI help.  Patient seems to have stopped  the bleeding as of 6 /01/2018.  Hemoglobin is stable.  Octreotide and Protonix drip stopped  over 24 hours ago .    Hemoglobin stable over 10 for over 48 hours.  Alcohol withdrawal resolved-improved with Librium.  She is chronically on Librium 10 mg p.o. 3 times a day as needed.  I have increased to 25 mg p.o. 3 times a day as needed.  This seems to be helping her more.  Alcoholic cirrhosis of the liver-patient is still drinking alcohol routinely.  She has been advised if she continues to drink she will most definitely continue to get worse.  Schizophrenia-appears stable   thrombocytopenia-platelets improved  to over 20.   Discharge Exam: Vitals:   02/14/18 0820 02/14/18 1300  BP:    Pulse:    Resp:    Temp: 97.8 F (36.6 C) 98.2 F (36.8 C)  SpO2:      General: Alert and oriented x4 no apparent distress cooperative and friendly no tremors this morning Cardiovascular: Regular rate and rhythm without murmurs rubs or gallops Respiratory: Clear to auscultation bilaterally no wheezes or rales  Discharge Instructions   Discharge Instructions    Diet - low sodium heart healthy   Complete by:  As directed    Discharge instructions   Complete by:  As directed    No ETOH at all   Increase activity slowly   Complete by:  As directed      Allergies as of 02/14/2018   No Known Allergies     Medication List    STOP taking these medications   traMADol 50 MG tablet Commonly known as:  Veatrice Bourbon  TAKE these medications   busPIRone 10 MG tablet Commonly known as:  BUSPAR Take 1 tablet (10 mg total) by mouth 2 (two) times daily.   cefdinir 300 MG capsule Commonly known as:  OMNICEF Take 1 capsule (300 mg total) by mouth 2 (two) times daily.   chlordiazePOXIDE 25 MG capsule Commonly known as:  LIBRIUM Take 1 capsule (25 mg total) by mouth 3 (three) times daily as needed for anxiety or withdrawal. What changed:    medication strength  how much to take  reasons to take  this   diazepam 5 MG tablet Commonly known as:  VALIUM Take 5 mg by mouth daily.   DILANTIN 100 MG ER capsule Generic drug:  phenytoin Take 300 mg by mouth at bedtime.   famotidine 20 MG tablet Commonly known as:  PEPCID Take 1 tablet (20 mg total) by mouth 2 (two) times daily.   FLUoxetine 20 MG capsule Commonly known as:  PROZAC Take 1 capsule (20 mg total) by mouth daily. For mood control   folic acid 1 MG tablet Commonly known as:  FOLVITE Take 1 tablet (1 mg total) by mouth daily. What changed:  Another medication with the same name was added. Make sure you understand how and when to take each.   folic acid 1 MG tablet Commonly known as:  FOLVITE Take 1 tablet (1 mg total) by mouth daily. Start taking on:  02/15/2018 What changed:  You were already taking a medication with the same name, and this prescription was added. Make sure you understand how and when to take each.   furosemide 20 MG tablet Commonly known as:  LASIX Take 1 tablet (20 mg total) by mouth daily.   gabapentin 100 MG capsule Commonly known as:  NEURONTIN Take 2 capsules (200 mg total) by mouth 3 (three) times daily.   haloperidol 2 MG tablet Commonly known as:  HALDOL Take 1 tablet (2 mg total) by mouth at bedtime.   hydrOXYzine 25 MG tablet Commonly known as:  ATARAX/VISTARIL Take 1 tablet (25 mg total) by mouth 3 (three) times daily as needed for anxiety.   lactulose 10 GM/15ML solution Commonly known as:  CHRONULAC Take 30 mLs (20 g total) by mouth daily.   multivitamin with minerals Tabs tablet Take 1 tablet by mouth daily.   pantoprazole 40 MG tablet Commonly known as:  PROTONIX Take 1 tablet (40 mg total) by mouth daily with breakfast. Start taking on:  02/15/2018   spironolactone 50 MG tablet Commonly known as:  ALDACTONE Take 1 tablet (50 mg total) by mouth daily.   thiamine 100 MG tablet Take 1 tablet (100 mg total) by mouth daily. What changed:  Another medication with the  same name was added. Make sure you understand how and when to take each.   thiamine 100 MG tablet Take 1 tablet (100 mg total) by mouth daily. Start taking on:  02/15/2018 What changed:  You were already taking a medication with the same name, and this prescription was added. Make sure you understand how and when to take each.   topiramate 50 MG tablet Commonly known as:  TOPAMAX Take 1 tablet (50 mg total) by mouth daily.   traZODone 50 MG tablet Commonly known as:  DESYREL Take 1 tablet (50 mg total) by mouth at bedtime as needed for sleep.      No Known Allergies Follow-up Information    [facility]. Schedule an appointment as soon as possible for  a visit.   Why:  Call to schedule appointment for follow up after DC Contact information: [place] [id number] [facility] Follow up.   Why:  you may fill your prescriptions at their low cost pharmacy M-F Contact information: [address] [place] [id number] [phone]           The results of significant diagnostics from this hospitalization (including imaging, microbiology, ancillary and laboratory) are listed below for reference.    Significant Diagnostic Studies: Dg Chest 2 View  Result Date: 02/10/2018 CLINICAL DATA:  Dyspnea. Shortness of breath with nausea and vomiting for 1 day. EXAM: CHEST - 2 VIEW COMPARISON:  Radiograph 10/22/2017, chest CT 10/25/2017 FINDINGS: The cardiomediastinal contours are normal. Sequela of prior granulomatous disease with calcified granuloma in the left lower lobe and calcified left hilar lymph nodes, unchanged. Pulmonary vasculature is normal. No consolidation, pleural effusion, or pneumothorax. No acute osseous abnormalities are seen. IMPRESSION: 1. No acute findings. 2. Prior granulomatous disease. Electronically Signed   By: Jeb Levering M.D.   On:  02/10/2018 19:24   Ct Head Wo Contrast  Result Date: 02/10/2018 CLINICAL DATA:  Multiple falls since yesterday. EXAM: CT HEAD WITHOUT CONTRAST TECHNIQUE: Contiguous axial images were obtained from the base of the skull through the vertex without intravenous contrast. COMPARISON:  01/13/2018 FINDINGS: Brain: No evidence of acute infarction, hemorrhage, hydrocephalus, extra-axial collection or mass lesion/mass effect. Vascular: No hyperdense vessel or unexpected calcification. Skull: Normal. Negative for fracture or focal lesion. Sinuses/Orbits: No acute finding. Other: Left ear metallic object causes streak artifact through the cerebellum and brainstem. IMPRESSION: No acute intracranial abnormality. Electronically Signed   By: Fidela Salisbury M.D.   On: 02/10/2018 19:11    Microbiology: Recent Results (from the past 240 hour(s))  MRSA PCR Screening     Status: None   Collection Time: 02/10/18 11:22 PM  Result Value Ref Range Status   MRSA by PCR NEGATIVE NEGATIVE Final    Comment:        The GeneXpert MRSA Assay (FDA approved for NASAL specimens only), is one component of a comprehensive MRSA colonization surveillance program. It is not intended to diagnose MRSA infection nor to guide or monitor treatment for MRSA infections. Performed at [facility], [place] [address] [place], [place] [address]      Labs: Basic Metabolic Panel: Recent Labs  Lab 02/10/18 1443 02/11/18 0019 02/11/18 0943 02/12/18 0620 02/13/18 1031 02/14/18 0446  NA 141 141 139 135 137 137  K 3.7 3.0* 4.0 3.6 3.2* 3.3*  CL 107 107 108 105 107 108  CO2 22 22 23  19* 23 21*  GLUCOSE 133* 105* 109* 74 157* 127*  BUN 5* 5* 6 6 7  <5*  CREATININE 0.71 0.74 0.69 0.69 0.63 0.52  CALCIUM 9.2 8.2* 7.7* 7.8* 8.3* 8.4*  MG 1.8 1.3* 2.1 1.6*  --   --   PHOS 2.8 2.5  --   --   --   --    Liver Function Tests: Recent Labs  Lab 02/10/18 1443  AST 73*  ALT 24  ALKPHOS 158*  BILITOT 2.3*  PROT 8.8*   ALBUMIN 3.4*   No results for input(s): LIPASE, AMYLASE in the last 168 hours. No results for input(s): AMMONIA in the last 168 hours. CBC: Recent Labs  Lab 02/10/18 1503 02/11/18 0019 02/12/18 1601 02/13/18 1031 02/14/18 0932  WBC 1.7* 3.2* 4.8 2.9* 2.9*  NEUTROABS 1.0*  --   --   --   --   HGB 11.2* 10.5* 9.3* 10.1* 10.9*  HCT 35.2* 33.4* 29.8* 31.8* 34.6*  MCV 88.7 90.3 91.4 90.1 89.6  PLT 5* <5* 23* 21* 20*   Cardiac Enzymes: No results for input(s): CKTOTAL, CKMB, CKMBINDEX, TROPONINI in the last 168 hours. BNP: BNP (last 3 results) No results for input(s): BNP in the last 8760 hours.  ProBNP (last 3 results) No results for input(s): PROBNP in the last 8760 hours.  CBG: Recent Labs  Lab 02/10/18 2335  GLUCAP 106*       Signed:  Mirah Nevins A MD.  [facility] 02/14/2018, 2:43 PM

## 2018-02-14 NOTE — Progress Notes (Signed)
Patient discharge instructions reviewed, patient verbalized understanding using teach back. IV removed, Patient has all listed belongings with her. Patient escorted out in wheelchair

## 2018-02-16 ENCOUNTER — Other Ambulatory Visit: Payer: Self-pay

## 2018-02-16 ENCOUNTER — Emergency Department (HOSPITAL_COMMUNITY)
Admission: EM | Admit: 2018-02-16 | Discharge: 2018-02-16 | Discharge status: Against Medical Advice | Payer: Self-pay | Attending: Emergency Medicine | Admitting: Emergency Medicine

## 2018-02-16 ENCOUNTER — Encounter (HOSPITAL_COMMUNITY): Payer: Self-pay | Admitting: *Deleted

## 2018-02-16 DIAGNOSIS — Z79899 Other long term (current) drug therapy: Secondary | ICD-10-CM | POA: Insufficient documentation

## 2018-02-16 DIAGNOSIS — H538 Other visual disturbances: Secondary | ICD-10-CM | POA: Insufficient documentation

## 2018-02-16 DIAGNOSIS — R531 Weakness: Secondary | ICD-10-CM | POA: Insufficient documentation

## 2018-02-16 DIAGNOSIS — R443 Hallucinations, unspecified: Secondary | ICD-10-CM | POA: Insufficient documentation

## 2018-02-16 LAB — CBC WITH DIFFERENTIAL/PLATELET
Basophils Absolute: 0 10*3/uL (ref 0.0–0.1)
Basophils Relative: 1 %
Eosinophils Absolute: 0.1 10*3/uL (ref 0.0–0.7)
Eosinophils Relative: 3 %
HCT: 35.7 % — ABNORMAL LOW (ref 36.0–46.0)
Hemoglobin: 11 g/dL — ABNORMAL LOW (ref 12.0–15.0)
Lymphocytes Relative: 41 %
Lymphs Abs: 1 10*3/uL (ref 0.7–4.0)
MCH: 28.6 pg (ref 26.0–34.0)
MCHC: 30.8 g/dL (ref 30.0–36.0)
MCV: 93 fL (ref 78.0–100.0)
Monocytes Absolute: 0.5 10*3/uL (ref 0.1–1.0)
Monocytes Relative: 18 %
Neutro Abs: 1 10*3/uL — ABNORMAL LOW (ref 1.7–7.7)
Neutrophils Relative %: 37 %
Platelets: 24 10*3/uL — CL (ref 150–400)
RBC: 3.84 MIL/uL — ABNORMAL LOW (ref 3.87–5.11)
RDW: 20.7 % — ABNORMAL HIGH (ref 11.5–15.5)
Smear Review: DECREASED
WBC: 2.6 10*3/uL — ABNORMAL LOW (ref 4.0–10.5)

## 2018-02-16 LAB — BASIC METABOLIC PANEL
Anion gap: 9 (ref 5–15)
BUN: <5 mg/dL — ABNORMAL LOW (ref 6–20)
CO2: 22 mmol/L (ref 22–32)
Calcium: 9 mg/dL (ref 8.9–10.3)
Chloride: 106 mmol/L (ref 101–111)
Creatinine, Ser: 0.7 mg/dL (ref 0.44–1.00)
GFR calc Af Amer: >60 mL/min (ref >60)
GFR calc non Af Amer: >60 mL/min (ref >60)
Glucose, Bld: 78 mg/dL (ref 65–99)
Potassium: 3.4 mmol/L — ABNORMAL LOW (ref 3.5–5.1)
Sodium: 137 mmol/L (ref 135–145)

## 2018-02-16 LAB — I-STAT BETA HCG BLOOD, ED (MC, WL, AP ONLY): I-stat hCG, quantitative: <5 m[IU]/mL (ref <5)

## 2018-02-16 NOTE — ED Provider Notes (Cosign Needed)
Patient placed in Quick Look pathway, seen and evaluated   Chief Complaint: syncopal episode, hearing voices  HPI:   Briana Sweeney is a 47 y.o. female who presents to the ED due to passing out spells and hearing voices. Patient reports she was just discharged for the hospital a few days ago and she did not get her Haldol Rx filled. Patient reports that things look blurry and she feels nervous and she hears 2 voices talking at the same time so she can not understand what they are saying. Patient denies S/I or H/I.  ROS: Resp: shortness of breath  Psych: hearing voices  Neuro: syncopal episode  Physical Exam:  BP 114/77 (BP Location: Right Arm)   Pulse 71   Temp 98.4 F (36.9 C) (Oral)   Resp 18   LMP  (LMP Unknown)   SpO2 100%   Gen: No distress  Neuro: Awake and Alert  Skin: Warm and dry      Initiation of care has begun. The patient has been counseled on the process, plan, and necessity for staying for the completion/evaluation, and the remainder of the medical screening examination    Ashley Murrain, NP 02/16/18 1900

## 2018-02-16 NOTE — ED Provider Notes (Signed)
[facility]ERGENC[facility] DEPARTMENT Provider Note   CSN: [id number] Arrival date & time: 02/16/18  1718     History   Chief Complaint Chief Complaint  Patient presents with  . Weakness  . Hallucinations    HPI Briana Sweeney is a 47 y.o. female.  HPI Patient was discharged 2 days ago after admission for alcohol withdrawal.  Denies drinking alcohol or drug ingestion since time of discharge.  She states she is feeling generally weak.  States she is having some blurred vision.  Reports vague hallucinations though unable to give details.  No fever or chills.  No vomiting or diarrhea.  No SI or HI. Past Medical History:  Diagnosis Date  . Anxiety   . Bipolar affective disorder ([place])    With anxiety features  . Cirrhosis of liver ([place])    Due to alcohol and hepatitis C  . Depression   . GERD (gastroesophageal reflux disease)   . Hematemesis 02/10/2018  . Hepatitis C 2018   hepatitis c and alcohol related hepatitis  . History of blood transfusion    "blood doesn't clot; I fell down and had to have a transfusion"  . History of kidney stones   . Migraine    "when I get really stressed" (09/01/2017)  . Schizophrenia ([place])   . Seizures ([place])    "when I run out of my RX; lots recently" (09/01/2017)    Patient Active Problem List   Diagnosis Date Noted  . Upper GI bleed 02/10/2018  . Alcohol withdrawal, with unspecified complication ([place]) 98/33/8250  . Chronic anemia   . Thrombocytopenia due to drugs   . Suicidal ideation   . Thrombocytopenia ([place]) 01/02/2018  . GERD (gastroesophageal reflux disease) 11/01/2017  . Trichimoniasis 10/30/2017  . Alcohol dependence ([place]) 10/29/2017  . MDD (major depressive disorder), severe ([place]) 10/28/2017  . Acute hyperactive alcohol withdrawal delirium ([place]) 10/09/2017  . Schizophrenia ([place]) 10/09/2017  . Alcohol abuse with alcohol-induced mood disorder ([place])   . Alcohol withdrawal syndrome with complication ([place]) 53/97/6734    . Esophageal varices without bleeding ([place])   . Hematemesis 09/01/2017  . Ascites due to alcoholic cirrhosis ([place])   . Decompensated hepatic cirrhosis ([place]) 07/23/2017  . Alcohol abuse 07/23/2017  . Hepatitis C antibody positive in blood 07/23/2017  . Hypokalemia 07/23/2017  . Jaundice 07/23/2017  . Coagulopathy ([place]) 07/23/2017  . Hypomagnesemia 07/23/2017  . Pancytopenia ([place]) 07/23/2017  . Alcoholic cirrhosis of liver with ascites ([place]) 07/23/2017    Past Surgical History:  Procedure Laterality Date  . ESOPHAGOGASTRODUODENOSCOPY N/A 09/03/2017   Procedure: ESOPHAGOGASTRODUODENOSCOPY (EGD);  Surgeon: Doran Stabler, MD;  Location: [facility];  Service: Gastroenterology;  Laterality: N/A;  . FINGER FRACTURE SURGERY Left    "shattered my pinky"  . FRACTURE SURGERY    . IR PARACENTESIS  07/23/2017  . IR PARACENTESIS  07/2017   "did it twice in the same week" (09/01/2017)  . SHOULDER OPEN ROTATOR CUFF REPAIR Right   . TUBAL LIGATION    . VAGINAL HYSTERECTOMY       OB History   None      Home Medications    Prior to Admission medications   Medication Sig Start Date End Date Taking? Authorizing Provider  busPIRone (BUSPAR) 10 MG tablet Take 1 tablet (10 mg total) by mouth 2 (two) times daily. 02/05/18  Yes [name], FNP  cefdinir (OMNICEF) 300 MG capsule Take 1 capsule (300 mg total) by mouth 2 (two) times  daily. 02/14/18  Yes Phillips Grout, MD  chlordiazePOXIDE (LIBRIUM) 25 MG capsule Take 1 capsule (25 mg total) by mouth 3 (three) times daily as needed for anxiety or withdrawal. 02/14/18  Yes Phillips Grout, MD  diazepam (VALIUM) 5 MG tablet Take 5 mg by mouth daily.   Yes [provider]  famotidine (PEPCID) 20 MG tablet Take 1 tablet (20 mg total) by mouth 2 (two) times daily. 01/08/18  Yes Eugenie Filler, MD  FLUoxetine (PROZAC) 20 MG capsule Take 1 capsule (20 mg total) by mouth daily. For mood control 01/08/18  Yes Eugenie Filler, MD  folic  acid (FOLVITE) 1 MG tablet Take 1 tablet (1 mg total) by mouth daily. 02/15/18  Yes Phillips Grout, MD  furosemide (LASIX) 20 MG tablet Take 1 tablet (20 mg total) by mouth daily. 01/08/18  Yes Eugenie Filler, MD  gabapentin (NEURONTIN) 100 MG capsule Take 2 capsules (200 mg total) by mouth 3 (three) times daily. 01/08/18  Yes Eugenie Filler, MD  haloperidol (HALDOL) 2 MG tablet Take 1 tablet (2 mg total) by mouth at bedtime. 01/08/18  Yes Eugenie Filler, MD  hydrOXYzine (ATARAX/VISTARIL) 25 MG tablet Take 1 tablet (25 mg total) by mouth 3 (three) times daily as needed for anxiety. 02/05/18  Yes [name], FNP  Multiple Vitamin (MULTIVITAMIN WITH MINERALS) TABS tablet Take 1 tablet by mouth daily. 01/09/18  Yes Eugenie Filler, MD  pantoprazole (PROTONIX) 40 MG tablet Take 1 tablet (40 mg total) by mouth daily with breakfast. 02/15/18  Yes Phillips Grout, MD  phenytoin (DILANTIN) 100 MG ER capsule Take 300 mg by mouth at bedtime.   Yes [provider]  thiamine 100 MG tablet Take 1 tablet (100 mg total) by mouth daily. 02/15/18  Yes Phillips Grout, MD  topiramate (TOPAMAX) 50 MG tablet Take 1 tablet (50 mg total) by mouth daily. 02/05/18  Yes [name], FNP  traZODone (DESYREL) 50 MG tablet Take 1 tablet (50 mg total) by mouth at bedtime as needed for sleep. 02/05/18  Yes [name], FNP  folic acid (FOLVITE) 1 MG tablet Take 1 tablet (1 mg total) by mouth daily. Patient not taking: Reported on 02/10/2018 01/09/18   Eugenie Filler, MD  lactulose (CHRONULAC) 10 GM/15ML solution Take 30 mLs (20 g total) by mouth daily. Patient not taking: Reported on 02/10/2018 01/09/18   Eugenie Filler, MD  spironolactone (ALDACTONE) 50 MG tablet Take 1 tablet (50 mg total) by mouth daily. Patient not taking: Reported on 02/10/2018 01/08/18   Eugenie Filler, MD  thiamine 100 MG tablet Take 1 tablet (100 mg total) by mouth daily. Patient not taking: Reported on 02/10/2018 01/09/18    Eugenie Filler, MD    Family History Family History  Problem Relation Age of Onset  . Lung cancer Mother 67  . Alcohol abuse Mother   . Throat cancer Father 61    Social History Social History   Tobacco Use  . Smoking status: Never Smoker  . Smokeless tobacco: Never Used  Substance Use Topics  . Alcohol use: Yes    Alcohol/week: 37.8 oz    Types: 63 Cans of beer per week    Comment: weekly "I have cut back"  . Drug use: No     Allergies   Patient has no known allergies.   Review of Systems Review of Systems  Constitutional: Negative for chills and fever.  HENT:  Negative for sore throat.   Eyes: Negative for visual disturbance.  Respiratory: Negative for shortness of breath.   Cardiovascular: Negative for chest pain.  Gastrointestinal: Negative for abdominal pain and nausea.  Genitourinary: Negative for dysuria, flank pain and frequency.  Musculoskeletal: Negative for back pain, neck pain and neck stiffness.  Skin: Negative for rash and wound.  Neurological: Positive for dizziness, weakness and light-headedness. Negative for tremors, numbness and headaches.  All other systems reviewed and are negative.    Physical Exam Updated Vital Signs BP 137/75 (BP Location: Right Arm)   Pulse (!) 58   Temp 98.4 F (36.9 C) (Oral)   Resp 18   LMP  (LMP Unknown)   SpO2 100%   Physical Exam  Constitutional: She is oriented to person, place, and time. She appears well-developed and well-nourished. No distress.  HENT:  Head: Normocephalic and atraumatic.  Mouth/Throat: Oropharynx is clear and moist. No oropharyngeal exudate.  Eyes: Pupils are equal, round, and reactive to light. EOM are normal.  Neck: Normal range of motion. Neck supple. No JVD present.  No meningismus  Cardiovascular: Normal rate and regular rhythm. Exam reveals no gallop and no friction rub.  No murmur heard. Pulmonary/Chest: Effort normal and breath sounds normal.  Abdominal: Soft. Bowel sounds  are normal. There is no tenderness. There is no rebound and no guarding.  Musculoskeletal: Normal range of motion. She exhibits no edema or tenderness.  Lymphadenopathy:    She has no cervical adenopathy.  Neurological: She is alert and oriented to person, place, and time.  Patient is alert and oriented x3 with clear, goal oriented speech. Patient has 5/5 motor in all extremities. Sensation is intact to light touch. Bilateral finger-to-nose is normal with no signs of dysmetria. Patient has a normal gait and walks without assistance.  Skin: Skin is warm and dry. Capillary refill takes less than 2 seconds. No rash noted. She is not diaphoretic. No erythema.  Psychiatric: She has a normal mood and affect. Her behavior is normal.  Patient answering questions appropriately.  Does not appear to be responding to internal stimuli.  No SI or HI.  Nursing note and vitals reviewed.    ED Treatments / Results  Labs (all labs ordered are listed, but only abnormal results are displayed) Labs Reviewed  BASIC METABOLIC PANEL - Abnormal; Notable for the following components:      Result Value   Potassium 3.4 (*)    BUN <5 (*)    All other components within normal limits  CBC WITH DIFFERENTIAL/PLATELET - Abnormal; Notable for the following components:   WBC 2.6 (*)    RBC 3.84 (*)    Hemoglobin 11.0 (*)    HCT 35.7 (*)    RDW 20.7 (*)    Platelets 24 (*)    Neutro Abs 1.0 (*)    All other components within normal limits  HEPATIC FUNCTION PANEL  URINALYSIS, ROUTINE W REFLEX MICROSCOPIC  ETHANOL  RAPID URINE DRUG SCREEN, HOSP PERFORMED  PHENYTOIN LEVEL, TOTAL  AMMONIA  CBG MONITORING, ED  I-STAT BETA HCG BLOOD, ED ([facility], [facility], [facility] ONLY)    EKG EKG Interpretation  Date/Time:  Monday February 16 2018 19:01:37 EDT Ventricular Rate:  70 PR Interval:  156 QRS Duration: 70 QT Interval:  410 QTC Calculation: 442 R Axis:   84 Text Interpretation:  Normal sinus rhythm Normal ECG Confirmed by Julianne Rice [phone]) on 02/16/2018 10:16:22 PM   Radiology No results found.  Procedures Procedures (including critical  care time)  Medications Ordered in ED Medications - No data to display   Initial Impression / Assessment and Plan / ED Course  I have reviewed the triage vital signs and the nursing notes.  Pertinent labs & imaging results that were available during my care of the patient were reviewed by me and considered in my medical decision making (see chart for details).     Patient left prior to completion of work-up.  Did not believe she was acutely psychotic or going through withdrawal.  Final Clinical Impressions(s) / ED Diagnoses   Final diagnoses:  Generalized weakness    ED Discharge Orders    None       Julianne Rice, MD 02/16/18 2344

## 2018-02-16 NOTE — ED Triage Notes (Addendum)
Pt reports being admitted recently for low platelets. Pt reports return of weakness and unsteady gait since last night. Reports syncopal episode this am. Also reports unable to fill her haldol prescription and is now having hallucinations. Denies SI or HI.

## 2018-02-16 NOTE — ED Notes (Signed)
Pt got up and walked out of room stating that she was ready to leave. Kim, RN educated pt on way they need to stay. Pt verbalized understanding and walked to ER lobby. MD Y., notified of patients decision to leave AMA. MD Y., has been in room to evaluate the pt.

## 2018-02-19 ENCOUNTER — Ambulatory Visit: Payer: Self-pay | Admitting: Family Medicine

## 2018-02-20 ENCOUNTER — Other Ambulatory Visit: Payer: Self-pay

## 2018-02-20 ENCOUNTER — Encounter: Payer: Self-pay | Admitting: Family Medicine

## 2018-02-20 ENCOUNTER — Ambulatory Visit (INDEPENDENT_AMBULATORY_CARE_PROVIDER_SITE_OTHER): Payer: Medicaid Other | Admitting: Family Medicine

## 2018-02-20 VITALS — BP 100/68 | HR 75 | Temp 98.0°F | Ht 60.0 in | Wt 135.0 lb

## 2018-02-20 DIAGNOSIS — F10239 Alcohol dependence with withdrawal, unspecified: Secondary | ICD-10-CM

## 2018-02-20 DIAGNOSIS — F419 Anxiety disorder, unspecified: Secondary | ICD-10-CM

## 2018-02-20 DIAGNOSIS — F10939 Alcohol use, unspecified with withdrawal, unspecified: Secondary | ICD-10-CM

## 2018-02-20 DIAGNOSIS — D696 Thrombocytopenia, unspecified: Secondary | ICD-10-CM | POA: Diagnosis not present

## 2018-02-20 DIAGNOSIS — Z0000 Encounter for general adult medical examination without abnormal findings: Secondary | ICD-10-CM | POA: Diagnosis not present

## 2018-02-20 DIAGNOSIS — Z09 Encounter for follow-up examination after completed treatment for conditions other than malignant neoplasm: Secondary | ICD-10-CM

## 2018-02-20 LAB — POCT URINALYSIS DIPSTICK
Bilirubin, UA: NEGATIVE
Blood, UA: NEGATIVE
Glucose, UA: NEGATIVE
Ketones, UA: NEGATIVE
Leukocytes, UA: NEGATIVE
Nitrite, UA: NEGATIVE
Protein, UA: NEGATIVE
Spec Grav, UA: 1.02 (ref 1.010–1.025)
Urobilinogen, UA: 1 E.U./dL
pH, UA: 6.5 (ref 5.0–8.0)

## 2018-02-20 MED ORDER — FLUOXETINE HCL 20 MG PO CAPS: 20.0000 mg | ORAL_CAPSULE | Freq: Every day | ORAL | 2 refills | Status: DC

## 2018-02-20 MED ORDER — HALOPERIDOL 2 MG PO TABS: 2.0000 mg | ORAL_TABLET | Freq: Every day | ORAL | 1 refills | Status: DC

## 2018-02-20 MED ORDER — HYDROXYZINE HCL 25 MG PO TABS: 25.0000 mg | ORAL_TABLET | Freq: Three times a day (TID) | ORAL | 1 refills | Status: DC | PRN

## 2018-02-20 MED ORDER — CHLORDIAZEPOXIDE HCL 25 MG PO CAPS: 25.0000 mg | ORAL_CAPSULE | Freq: Three times a day (TID) | ORAL | 1 refills | Status: DC | PRN

## 2018-02-20 MED ORDER — GABAPENTIN 100 MG PO CAPS: 200.0000 mg | ORAL_CAPSULE | Freq: Three times a day (TID) | ORAL | 0 refills | Status: DC

## 2018-02-20 MED ORDER — DIAZEPAM 5 MG PO TABS: 5.0000 mg | ORAL_TABLET | Freq: Every day | ORAL | 1 refills | Status: DC

## 2018-02-20 NOTE — Telephone Encounter (Signed)
Medication sent.

## 2018-02-20 NOTE — [facility] Notes (Addendum)
Subjective:     Patient ID: Briana Sweeney, female   DOB: 05-12-1971, 47 y.o.   MRN: [id number]   PCP: Briana Becton, NP  Chief Complaint  Patient presents with  . Follow-up    2 weeks on chronic condition    HPI  Briana Sweeney has a history of Thrombocytopenia, Seizures, Schizophrenia, Migraine Headaches, Hepatitis C, GERD, Depression, Cirrhosis, and Bipolar Disorder. Her most recent ED admission was on 02/16/2018 for Alcohol Intoxification. She received 1 unit of Platelets and 1 unit PRBCs. She was scheduled with infection disease on 10/28/2017 and an appointment with Hematology on 01/29/2018.  She is here today for hospital follow up for her most ED visit on 6/10//2019 for alcohol intoxication. She was initiated on Librium, Haldol, Thiamine, and Protonix.   Current Status: Since her last visit on 12/02/2017, patient patient has had 2  visits to the ED. She is in no apparent distress today. She ambulates well. She seems to be doing well and is less anxious and calmer today. She was positive for cocaine and benzodiazepines on 02/02/2018 and ethanol level was 353.  She states that she is no longer drinking alcohol. She states that she has been sober for 10 days now. She is very concerned about her liver condition. Her most recent platelet count was at 24 on 02/16/2018. She has bruising has multiple bruises over her today. She continues to deny petechiae, gingival bleeding, epistaxis, profuse bleeding from superficial cuts, melena, menorrhagia, mentrorrhagia, hematochezia, and hematuria. She states that her appetite is better. Denies abdominal pain, nausea, vomiting, diarrhea, and constipation.   She has mild anxiety today.   She denies pain today.   Past Medical History:  Diagnosis Date  . Anxiety   . Bipolar affective disorder ([place])    With anxiety features  . 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 Depression   . GERD (gastroesophageal reflux disease)   . Hematemesis  02/10/2018  . Hepatitis C 2018   hepatitis c and alcohol related hepatitis  . History of blood transfusion    "blood doesn't clot; I fell down and had to have a transfusion"  . History of kidney stones   . Migraine    "when I get really stressed" (09/01/2017)  . Schizophrenia ([place])   . Seizures ([place])    "when I run out of my RX; lots recently" (09/01/2017)    Family History  Problem Relation Age of Onset  . Lung cancer Mother 83  . Alcohol abuse Mother   . Throat cancer Father 46    Social History   Socioeconomic History  . Marital status: Divorced    Spouse name: Not on file  . Number of children: Not on file  . Years of education: Not on file  . Highest education level: Not on file  Occupational History  . Occupation: applying for disability  Social Needs  . Financial resource strain: Not on file  . Food insecurity:    Worry: Not on file    Inability: Not on file  . Transportation needs:    Medical: Not on file    Non-medical: Not on file  Tobacco Use  . Smoking status: Never Smoker  . Smokeless tobacco: Never Used  Substance and Sexual Activity  . Alcohol use: Yes    Alcohol/week: 37.8 oz    Types: 63 Cans of beer per week    Comment: weekly "I have cut back"  . Drug use: No  .  Sexual activity: Not Currently  Lifestyle  . Physical activity:    Days per week: Not on file    Minutes per session: Not on file  . Stress: Not on file  Relationships  . Social connections:    Talks on phone: Not on file    Gets together: Not on file    Attends religious service: Not on file    Active member of club or organization: Not on file    Attends meetings of clubs or organizations: Not on file    Relationship status: Not on file  . Intimate partner violence:    Fear of current or ex partner: Not on file    Emotionally abused: Not on file    Physically abused: Not on file    Forced sexual activity: Not on file  Other Topics Concern  . Not on file  Social History  Narrative   She moved with a boyfriend to [place] and was followed at [facility].  He died of a massive heart attack in 8/18, per her report, and so she moved back to [place] and is living with a friend.    Past Surgical History:  Procedure Laterality Date  . ESOPHAGOGASTRODUODENOSCOPY N/A 09/03/2017   Procedure: ESOPHAGOGASTRODUODENOSCOPY (EGD);  Surgeon: Doran Stabler, MD;  Location: [facility];  Service: Gastroenterology;  Laterality: N/A;  . FINGER FRACTURE SURGERY Left    "shattered my pinky"  . FRACTURE SURGERY    . IR PARACENTESIS  07/23/2017  . IR PARACENTESIS  07/2017   "did it twice in the same week" (09/01/2017)  . SHOULDER OPEN ROTATOR CUFF REPAIR Right   . TUBAL LIGATION    . VAGINAL HYSTERECTOMY     Immunization History  Administered Date(s) Administered  . Influenza,inj,Quad PF,6+ Mos 09/04/2017  . Pneumococcal Polysaccharide-23 09/04/2017    Current Meds  Medication Sig  . busPIRone (BUSPAR) 10 MG tablet Take 1 tablet (10 mg total) by mouth 2 (two) times daily.  . cefdinir (OMNICEF) 300 MG capsule Take 1 capsule (300 mg total) by mouth 2 (two) times daily.  . famotidine (PEPCID) 20 MG tablet Take 1 tablet (20 mg total) by mouth 2 (two) times daily.  [name] Kitchen FLUoxetine (PROZAC) 20 MG capsule Take 1 capsule (20 mg total) by mouth daily. For mood control  . folic acid (FOLVITE) 1 MG tablet Take 1 tablet (1 mg total) by mouth daily.  . folic acid (FOLVITE) 1 MG tablet Take 1 tablet (1 mg total) by mouth daily.  . furosemide (LASIX) 20 MG tablet Take 1 tablet (20 mg total) by mouth daily.  [name] Kitchen gabapentin (NEURONTIN) 100 MG capsule Take 2 capsules (200 mg total) by mouth 3 (three) times daily.  . hydrOXYzine (ATARAX/VISTARIL) 25 MG tablet Take 1 tablet (25 mg total) by mouth 3 (three) times daily as needed for anxiety.  [name] Kitchen lactulose (CHRONULAC) 10 GM/15ML solution Take 30 mLs (20 g total) by mouth daily.  . Multiple Vitamin (MULTIVITAMIN WITH MINERALS) TABS  tablet Take 1 tablet by mouth daily.  . pantoprazole (PROTONIX) 40 MG tablet Take 1 tablet (40 mg total) by mouth daily with breakfast.  . phenytoin (DILANTIN) 100 MG ER capsule Take 300 mg by mouth at bedtime.  [name] Kitchen spironolactone (ALDACTONE) 50 MG tablet Take 1 tablet (50 mg total) by mouth daily.  [name] Kitchen thiamine 100 MG tablet Take 1 tablet (100 mg total) by mouth daily.  [name] Kitchen thiamine 100 MG tablet Take 1 tablet (100 mg total) by mouth daily.  [name] Kitchen  topiramate (TOPAMAX) 50 MG tablet Take 1 tablet (50 mg total) by mouth daily.  . traZODone (DESYREL) 50 MG tablet Take 1 tablet (50 mg total) by mouth at bedtime as needed for sleep.  . [DISCONTINUED] FLUoxetine (PROZAC) 20 MG capsule Take 1 capsule (20 mg total) by mouth daily. For mood control  . [DISCONTINUED] hydrOXYzine (ATARAX/VISTARIL) 25 MG tablet Take 1 tablet (25 mg total) by mouth 3 (three) times daily as needed for anxiety.   No Known Allergies  BP 100/68 (BP Location: Left Arm, Patient Position: Sitting, Cuff Size: Small)   Pulse 75   Temp 98 F (36.7 C) (Oral)   Ht 5' (1.524 m)   Wt 135 lb (61.2 kg)   LMP  (LMP Unknown)   SpO2 100%   BMI 26.37 kg/m   Review of Systems  Constitutional: Negative.   HENT: Negative.   Eyes: Negative.   Respiratory: Negative.   Cardiovascular: Negative.   Gastrointestinal: Negative.   Endocrine: Negative.   Genitourinary: Negative.   Musculoskeletal: Negative.   Skin: Negative.   Allergic/Immunologic: Negative.   Neurological: Negative.   Hematological: Negative.   Psychiatric/Behavioral: Negative.    Objective:   Physical Exam  Constitutional: She is oriented to person, place, and time. She appears well-developed and well-nourished.  HENT:  Head: Normocephalic and atraumatic.  Right Ear: External ear normal.  Left Ear: External ear normal.  Nose: Nose normal.  Mouth/Throat: Oropharynx is clear and moist.  Eyes: Pupils are equal, round, and reactive to light. Conjunctivae and EOM are normal.   Neck: Normal range of motion. Neck supple.  Cardiovascular: Normal rate, regular rhythm, normal heart sounds and intact distal pulses.  Pulmonary/Chest: Effort normal and breath sounds normal.  Abdominal: Soft. Bowel sounds are normal.  Musculoskeletal: Normal range of motion.  Neurological: She is alert and oriented to person, place, and time.  Skin: Capillary refill takes less than 2 seconds.  Multiple bruises over body.   Psychiatric: She has a normal mood and affect. Her behavior is normal. Judgment and thought content normal.  Nursing note and vitals reviewed.  Plan:   1. Hospital discharge follow-up We will draw labs today to evaluate after hospital discharge.  She will report call office or report to ED if symptoms occur or worsen.  2. Screening for blood or protein in urine Urinalysis is normal today.   3. Thrombocytopenia ([place]) Probable r/t alcohol abuse and Hep C. Platelets were 24.0 on 6/10//2019. She reports no signs and symptoms of bleeding. We will repeat CBC to re-evaluate Platelets level today. Bleeding precautions reviewed with patient. She will report to office or ED if symptoms appear.  - Urinalysis Dipstick  4. Polysubstance abuse ([place]) Positive for cocaine and benzos on 02/02/2018. Denies use of illegal drugs.   5. Alcohol abuse She has discontinued alcohol consumption for 10 days.    6. Chronic Hepatitis C She will continue routine follow ups with Infection disease. No active treatment at this time.   7. Follow up She will follow up with [place] in 2 weeks.     Meds ordered this encounter  Medications  . FLUoxetine (PROZAC) 20 MG capsule    Sig: Take 1 capsule (20 mg total) by mouth daily. For mood control    Dispense:  30 capsule    Refill:  2  . hydrOXYzine (ATARAX/VISTARIL) 25 MG tablet    Sig: Take 1 tablet (25 mg total) by mouth 3 (three) times daily as needed for anxiety.  Dispense:  20 tablet    Refill:  1  . chlordiazePOXIDE (LIBRIUM) 25 MG  capsule    Sig: Take 1 capsule (25 mg total) by mouth 3 (three) times daily as needed for anxiety or withdrawal.    Dispense:  30 capsule    Refill:  1  . diazepam (VALIUM) 5 MG tablet    Sig: Take 1 tablet (5 mg total) by mouth daily.    Dispense:  30 tablet    Refill:  1    Order Specific Question:   Supervising Provider    Answer:   Tresa Garter [id number]  . haloperidol (HALDOL) 2 MG tablet    Sig: Take 1 tablet (2 mg total) by mouth at bedtime.    Dispense:  20 tablet    Refill:  [place],  MSN, FNP-BC [facility] [address] [place], [place] [address] [phone]

## 2018-02-23 ENCOUNTER — Other Ambulatory Visit: Payer: Self-pay | Admitting: Family Medicine

## 2018-02-23 DIAGNOSIS — F419 Anxiety disorder, unspecified: Secondary | ICD-10-CM

## 2018-02-23 NOTE — [facility] Notes (Signed)
Patient unable to pick up Rx for Diazepam at [facility].  Rx to [facility] in [place] today.

## 2018-02-24 ENCOUNTER — Other Ambulatory Visit: Payer: Self-pay | Admitting: Family Medicine

## 2018-02-24 DIAGNOSIS — F419 Anxiety disorder, unspecified: Secondary | ICD-10-CM

## 2018-02-24 LAB — COMPREHENSIVE METABOLIC PANEL
ALT: 14 IU/L (ref 0–32)
AST: 35 IU/L (ref 0–40)
Albumin/Globulin Ratio: 0.9 — ABNORMAL LOW (ref 1.2–2.2)
Albumin: 3.7 g/dL (ref 3.5–5.5)
Alkaline Phosphatase: 165 IU/L — ABNORMAL HIGH (ref 39–117)
BUN/Creatinine Ratio: 5 — ABNORMAL LOW (ref 9–23)
BUN: 4 mg/dL — ABNORMAL LOW (ref 6–24)
Bilirubin Total: 0.6 mg/dL (ref 0.0–1.2)
CO2: 22 mmol/L (ref 20–29)
Calcium: 8.8 mg/dL (ref 8.7–10.2)
Chloride: 104 mmol/L (ref 96–106)
Creatinine, Ser: 0.75 mg/dL (ref 0.57–1.00)
GFR calc Af Amer: 111 mL/min/{1.73_m2} (ref >59)
GFR calc non Af Amer: 97 mL/min/{1.73_m2} (ref >59)
Globulin, Total: 4.2 g/dL (ref 1.5–4.5)
Glucose: 81 mg/dL (ref 65–99)
Potassium: 3.8 mmol/L (ref 3.5–5.2)
Sodium: 138 mmol/L (ref 134–144)
Total Protein: 7.9 g/dL (ref 6.0–8.5)

## 2018-02-24 LAB — ETHANOL: Ethanol: NEGATIVE %

## 2018-02-24 LAB — PHOSPHORUS: Phosphorus: 2.5 mg/dL (ref 2.5–4.5)

## 2018-02-24 LAB — MAGNESIUM: Magnesium: 1.9 mg/dL (ref 1.6–2.3)

## 2018-02-24 MED ORDER — DIAZEPAM 5 MG PO TABS: 5.0000 mg | ORAL_TABLET | Freq: Every day | ORAL | 1 refills | Status: DC

## 2018-02-24 NOTE — [facility] Notes (Signed)
Rx for Diazepam 're-sent' to [facility] in [place], [place] today.

## 2018-02-25 MED ORDER — DIAZEPAM 5 MG PO TABS: 5.0000 mg | ORAL_TABLET | Freq: Every day | ORAL | 1 refills | Status: DC

## 2018-02-26 ENCOUNTER — Other Ambulatory Visit: Payer: Self-pay

## 2018-02-27 ENCOUNTER — Telehealth: Payer: Self-pay

## 2018-02-27 NOTE — Telephone Encounter (Signed)
Left a vm for patient to callback 

## 2018-02-27 NOTE — Telephone Encounter (Signed)
-----   Message from [name], [place] sent at 02/26/2018 10:31 PM EDT ----- Regarding: "Lab Results" Briana Sweeney,   Please let patient know that her labs were stable. She will follow up on 03/06/2018.  Thanks!

## 2018-03-02 ENCOUNTER — Other Ambulatory Visit: Payer: Self-pay | Admitting: Family Medicine

## 2018-03-02 DIAGNOSIS — F10239 Alcohol dependence with withdrawal, unspecified: Secondary | ICD-10-CM

## 2018-03-02 DIAGNOSIS — F10939 Alcohol use, unspecified with withdrawal, unspecified: Secondary | ICD-10-CM

## 2018-03-03 ENCOUNTER — Telehealth: Payer: Self-pay

## 2018-03-03 NOTE — Telephone Encounter (Signed)
Pt called and requests information on prescription for Librium; states that pharmacy has no record of prescription order; request copy of medical records

## 2018-03-04 ENCOUNTER — Other Ambulatory Visit: Payer: Self-pay | Admitting: Family Medicine

## 2018-03-04 DIAGNOSIS — F10239 Alcohol dependence with withdrawal, unspecified: Secondary | ICD-10-CM

## 2018-03-04 DIAGNOSIS — F10939 Alcohol use, unspecified with withdrawal, unspecified: Secondary | ICD-10-CM

## 2018-03-04 MED ORDER — CHLORDIAZEPOXIDE HCL 25 MG PO CAPS: 25.0000 mg | ORAL_CAPSULE | Freq: Three times a day (TID) | ORAL | 0 refills | Status: DC | PRN

## 2018-03-04 NOTE — Telephone Encounter (Signed)
Briana Sweeney,  This medication was prescribed by Lanelle Bal.

## 2018-03-04 NOTE — Telephone Encounter (Signed)
Duplicate

## 2018-03-04 NOTE — [facility] Notes (Signed)
Rx for Librium sent to [facility] in [place], [place].

## 2018-03-06 ENCOUNTER — Ambulatory Visit (INDEPENDENT_AMBULATORY_CARE_PROVIDER_SITE_OTHER): Payer: Medicaid Other | Admitting: Family Medicine

## 2018-03-06 ENCOUNTER — Encounter: Payer: Self-pay | Admitting: Family Medicine

## 2018-03-06 VITALS — BP 124/78 | HR 88 | Temp 98.0°F | Ht 60.0 in | Wt 138.8 lb

## 2018-03-06 DIAGNOSIS — D696 Thrombocytopenia, unspecified: Secondary | ICD-10-CM

## 2018-03-06 DIAGNOSIS — Z09 Encounter for follow-up examination after completed treatment for conditions other than malignant neoplasm: Secondary | ICD-10-CM

## 2018-03-06 DIAGNOSIS — F10239 Alcohol dependence with withdrawal, unspecified: Secondary | ICD-10-CM | POA: Diagnosis not present

## 2018-03-06 DIAGNOSIS — Z1389 Encounter for screening for other disorder: Secondary | ICD-10-CM | POA: Diagnosis not present

## 2018-03-06 DIAGNOSIS — Z0000 Encounter for general adult medical examination without abnormal findings: Secondary | ICD-10-CM

## 2018-03-06 DIAGNOSIS — K219 Gastro-esophageal reflux disease without esophagitis: Secondary | ICD-10-CM

## 2018-03-06 DIAGNOSIS — F1910 Other psychoactive substance abuse, uncomplicated: Secondary | ICD-10-CM

## 2018-03-06 DIAGNOSIS — F419 Anxiety disorder, unspecified: Secondary | ICD-10-CM

## 2018-03-06 DIAGNOSIS — F10939 Alcohol use, unspecified with withdrawal, unspecified: Secondary | ICD-10-CM

## 2018-03-06 DIAGNOSIS — F1010 Alcohol abuse, uncomplicated: Secondary | ICD-10-CM

## 2018-03-06 LAB — POCT URINALYSIS DIP (MANUAL ENTRY)
Bilirubin, UA: NEGATIVE
Blood, UA: NEGATIVE
Glucose, UA: NEGATIVE mg/dL
Ketones, POC UA: NEGATIVE mg/dL
Leukocytes, UA: NEGATIVE
Nitrite, UA: NEGATIVE
Protein Ur, POC: NEGATIVE mg/dL
Spec Grav, UA: 1.015 (ref 1.010–1.025)
Urobilinogen, UA: 0.2 E.U./dL
pH, UA: 7 (ref 5.0–8.0)

## 2018-03-06 NOTE — Progress Notes (Signed)
Subjective:    Patient ID: Briana Sweeney, female    DOB: 1971/08/18, 47 y.o.   MRN: [id number]   PCP: Kathe Becton, NP  Chief Complaint  Patient presents with  . Follow-up    labs    HPI  [name] has a history of Seizures, Schizophrenia, Migraines, Hepatitis C, GERD, Depression, Bipolar Affect Disorder, and Anxiety.   Current Status: Since her last office visit, she is doing well with no complaints.   She admits that her anxiety has increased.   She states that she has began to to drink alcohol again. She is currently drinking 3 beers a day.   Her most recent platelet count was at 24 on 02/16/2018. She has bruising has multiple bruises over her today. She continues to deny petechiae, gingival bleeding, epistaxis, profuse bleeding from superficial cuts, melena, menorrhagia, mentrorrhagia, hematochezia, and hematuria. She states that her appetite is good. Denies abdominal pain, nausea, vomiting, diarrhea, and constipation. No reports of any other GI problems.  She denies fevers, chills, fatigue, recent infections, weight loss, and night sweats.   She has not had any headaches, visual changes, dizziness, and falls.   No chest pain, heart palpitations, cough and shortness of breath reported.   No depression or anxiety, and denies suicidal ideations, homicidal ideations, or auditory hallucinations.   She denies pain today.   Past Medical History:  Diagnosis Date  . Anxiety   . Bipolar affective disorder ([place])    With anxiety features  . 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 Depression   . GERD (gastroesophageal reflux disease)   . Hematemesis 02/10/2018  . Hepatitis C 2018   hepatitis c and alcohol related hepatitis  . History of blood transfusion    "blood doesn't clot; I fell down and had to have a transfusion"  . History of kidney stones   . Migraine    "when I get really stressed" (09/01/2017)  . Schizophrenia ([place])   . Seizures ([place])     "when I run out of my RX; lots recently" (09/01/2017)     Family History  Problem Relation Age of Onset  . Lung cancer Mother 67  . Alcohol abuse Mother   . Throat cancer Father 45   Social History   Socioeconomic History  . Marital status: Divorced    Spouse name: Not on file  . Number of children: Not on file  . Years of education: Not on file  . Highest education level: Not on file  Occupational History  . Occupation: applying for disability  Social Needs  . Financial resource strain: Not on file  . Food insecurity:    Worry: Not on file    Inability: Not on file  . Transportation needs:    Medical: Not on file    Non-medical: Not on file  Tobacco Use  . Smoking status: Never Smoker  . Smokeless tobacco: Never Used  Substance and Sexual Activity  . Alcohol use: Yes    Alcohol/week: 37.8 oz    Types: 63 Cans of beer per week    Comment: weekly "I have cut back"  . Drug use: No  . Sexual activity: Not Currently  Lifestyle  . Physical activity:    Days per week: Not on file    Minutes per session: Not on file  . Stress: Not on file  Relationships  . Social connections:    Talks on phone: Not on file  Gets together: Not on file    Attends religious service: Not on file    Active member of club or organization: Not on file    Attends meetings of clubs or organizations: Not on file    Relationship status: Not on file  . Intimate partner violence:    Fear of current or ex partner: Not on file    Emotionally abused: Not on file    Physically abused: Not on file    Forced sexual activity: Not on file  Other Topics Concern  . Not on file  Social History Narrative   She moved with a boyfriend to [place] and was followed at [facility].  He died of a massive heart attack in 8/18, per her report, and so she moved back to [place] and is living with a friend.    Past Surgical History:  Procedure Laterality Date  . ESOPHAGOGASTRODUODENOSCOPY N/A  09/03/2017   Procedure: ESOPHAGOGASTRODUODENOSCOPY (EGD);  Surgeon: Doran Stabler, MD;  Location: [facility];  Service: Gastroenterology;  Laterality: N/A;  . FINGER FRACTURE SURGERY Left    "shattered my pinky"  . FRACTURE SURGERY    . IR PARACENTESIS  07/23/2017  . IR PARACENTESIS  07/2017   "did it twice in the same week" (09/01/2017)  . SHOULDER OPEN ROTATOR CUFF REPAIR Right   . TUBAL LIGATION    . VAGINAL HYSTERECTOMY      Immunization History  Administered Date(s) Administered  . Influenza,inj,Quad PF,6+ Mos 09/04/2017  . Pneumococcal Polysaccharide-23 09/04/2017    Current Meds  Medication Sig  . busPIRone (BUSPAR) 10 MG tablet Take 1 tablet (10 mg total) by mouth 2 (two) times daily.  . cefdinir (OMNICEF) 300 MG capsule Take 1 capsule (300 mg total) by mouth 2 (two) times daily.  . chlordiazePOXIDE (LIBRIUM) 25 MG capsule Take 1 capsule (25 mg total) by mouth 3 (three) times daily as needed for anxiety or withdrawal.  . diazepam (VALIUM) 5 MG tablet Take 1 tablet (5 mg total) by mouth daily.  . famotidine (PEPCID) 20 MG tablet Take 1 tablet (20 mg total) by mouth 2 (two) times daily.  . folic acid (FOLVITE) 1 MG tablet Take 1 tablet (1 mg total) by mouth daily.  . folic acid (FOLVITE) 1 MG tablet Take 1 tablet (1 mg total) by mouth daily.  . furosemide (LASIX) 20 MG tablet Take 1 tablet (20 mg total) by mouth daily.  [name] Kitchen gabapentin (NEURONTIN) 100 MG capsule Take 2 capsules (200 mg total) by mouth 3 (three) times daily.  . haloperidol (HALDOL) 2 MG tablet Take 1 tablet (2 mg total) by mouth at bedtime.  . hydrOXYzine (ATARAX/VISTARIL) 25 MG tablet Take 1 tablet (25 mg total) by mouth 3 (three) times daily as needed for anxiety.  [name] Kitchen lactulose (CHRONULAC) 10 GM/15ML solution Take 30 mLs (20 g total) by mouth daily.  . Multiple Vitamin (MULTIVITAMIN WITH MINERALS) TABS tablet Take 1 tablet by mouth daily.  . pantoprazole (PROTONIX) 40 MG tablet Take 1 tablet (40 mg total)  by mouth daily with breakfast.  . phenytoin (DILANTIN) 100 MG ER capsule Take 300 mg by mouth at bedtime.  [name] Kitchen spironolactone (ALDACTONE) 50 MG tablet Take 1 tablet (50 mg total) by mouth daily.  [name] Kitchen topiramate (TOPAMAX) 50 MG tablet Take 1 tablet (50 mg total) by mouth daily.  . [DISCONTINUED] famotidine (PEPCID) 20 MG tablet Take 1 tablet (20 mg total) by mouth 2 (two) times daily.  . [DISCONTINUED] furosemide (LASIX) 20  MG tablet Take 1 tablet (20 mg total) by mouth daily.  . [DISCONTINUED] hydrOXYzine (ATARAX/VISTARIL) 25 MG tablet Take 1 tablet (25 mg total) by mouth 3 (three) times daily as needed for anxiety.  . [DISCONTINUED] spironolactone (ALDACTONE) 50 MG tablet Take 1 tablet (50 mg total) by mouth daily.  . [DISCONTINUED] topiramate (TOPAMAX) 50 MG tablet Take 1 tablet (50 mg total) by mouth daily.    No Known Allergies  BP 124/78 (BP Location: Left Arm, Patient Position: Sitting, Cuff Size: Small)   Pulse 88   Temp 98 F (36.7 C) (Oral)   Ht 5' (1.524 m)   Wt 138 lb 12.8 oz (63 kg)   LMP  (LMP Unknown)   SpO2 98%   BMI 27.11 kg/m   Review of Systems  Constitutional: Negative.   HENT: Negative.   Eyes: Negative.   Respiratory: Negative.   Cardiovascular: Negative.   Gastrointestinal: Negative.   Endocrine: Negative.   Genitourinary: Negative.   Musculoskeletal: Negative.   Skin: Negative.        Scattered bruises r/t decreased Platelet Count.   Allergic/Immunologic: Negative.   Neurological: Negative.   Hematological: Negative.   Psychiatric/Behavioral: Negative.    Objective:   Physical Exam  Constitutional: She is oriented to person, place, and time.  HENT:  Head: Normocephalic and atraumatic.  Right Ear: External ear normal.  Left Ear: External ear normal.  Nose: Nose normal.  Mouth/Throat: Oropharynx is clear and moist.  Eyes: Pupils are equal, round, and reactive to light. Conjunctivae and EOM are normal.  Neck: Normal range of motion. Neck supple.   Cardiovascular: Normal rate, regular rhythm, normal heart sounds and intact distal pulses.  Pulmonary/Chest: Effort normal and breath sounds normal.  Abdominal: Soft. Bowel sounds are normal.  Musculoskeletal: Normal range of motion.  Neurological: She is alert and oriented to person, place, and time.  Skin: Skin is warm and dry. Capillary refill takes less than 2 seconds.  Psychiatric: She has a normal mood and affect. Her behavior is normal. Judgment and thought content normal.  Nursing note and vitals reviewed.  Assessment & Plan:   1. Thrombocytopenia ([place]) History of Alcohol Abuse. Platelet count at 24 on 02/16/2018. No reports of bleeding. We will re-evaluate CBC today. She has appointment with Oncology on 04/29/2018 for further evaluation.  - CBC with Differential  2. Screening for blood or protein in urine Urinalysis is negative today.  - POCT urinalysis dipstick  3. Alcohol withdrawal, with unspecified complication ([place]) Currently reports drinking 3 beers daily. Monitor.  - Drug Screen 12+Alcohol+CRT, Ur  4. Anxiety Increasing. She will continue Buspar as prescribed. We will refer her back to Psychiatry today. She will [facility] with [facility] as needed.  - busPIRone (BUSPAR) 10 MG tablet; Take 1 tablet (10 mg total) by mouth 2 (two) times daily.  Dispense: 30 tablet; Refill: 2 - hydrOXYzine (ATARAX/VISTARIL) 25 MG tablet; Take 1 tablet (25 mg total) by mouth 3 (three) times daily as needed for anxiety.  Dispense: 90 tablet; Refill: 2  5. Health care maintenance - Comprehensive metabolic panel  6. Alcohol abuse Currently drinking 3 beers daily.   7. Polysubstance abuse ([place]) Patient has recently began drinking alcohol again. We will assess Alcohol and Drug Screen today.  - Drug Screen 12+Alcohol+CRT, Ur  8. Gastroesophageal reflux disease without esophagitis Stable. She will continue Pepcid as prescribed.  - famotidine (PEPCID) 20 MG tablet; Take 1 tablet (20 mg total)  by mouth 2 (two) times daily.  Dispense: 60 tablet; Refill: 2  9. Follow up She will follow up in 2 months.   Meds ordered this encounter  Medications  . busPIRone (BUSPAR) 10 MG tablet    Sig: Take 1 tablet (10 mg total) by mouth 2 (two) times daily.    Dispense:  30 tablet    Refill:  2  . famotidine (PEPCID) 20 MG tablet    Sig: Take 1 tablet (20 mg total) by mouth 2 (two) times daily.    Dispense:  60 tablet    Refill:  2  . furosemide (LASIX) 20 MG tablet    Sig: Take 1 tablet (20 mg total) by mouth daily.    Dispense:  30 tablet    Refill:  2  . hydrOXYzine (ATARAX/VISTARIL) 25 MG tablet    Sig: Take 1 tablet (25 mg total) by mouth 3 (three) times daily as needed for anxiety.    Dispense:  90 tablet    Refill:  2  . spironolactone (ALDACTONE) 50 MG tablet    Sig: Take 1 tablet (50 mg total) by mouth daily.    Dispense:  30 tablet    Refill:  2  . topiramate (TOPAMAX) 50 MG tablet    Sig: Take 1 tablet (50 mg total) by mouth daily.    Dispense:  30 tablet    Refill:  2    Referral Orders     Ambulatory referral to Psychiatry  Kathe Becton,  MSN, FNP-BC Patient [facility] [address] [place], [place] [address] [phone]

## 2018-03-07 LAB — CBC WITH DIFFERENTIAL/PLATELET
Basophils Absolute: 0 10*3/uL (ref 0.0–0.2)
Basos: 0 %
EOS (ABSOLUTE): 0.1 10*3/uL (ref 0.0–0.4)
Eos: 5 %
Hematocrit: 37 % (ref 34.0–46.6)
Hemoglobin: 11.9 g/dL (ref 11.1–15.9)
Immature Grans (Abs): 0 10*3/uL (ref 0.0–0.1)
Immature Granulocytes: 0 %
Lymphocytes Absolute: 0.9 10*3/uL (ref 0.7–3.1)
Lymphs: 38 %
MCH: 27.8 pg (ref 26.6–33.0)
MCHC: 32.2 g/dL (ref 31.5–35.7)
MCV: 86 fL (ref 79–97)
Monocytes Absolute: 0.3 10*3/uL (ref 0.1–0.9)
Monocytes: 12 %
Neutrophils Absolute: 1.1 10*3/uL — ABNORMAL LOW (ref 1.4–7.0)
Neutrophils: 45 %
RBC: 4.28 x10E6/uL (ref 3.77–5.28)
RDW: 20.3 % — ABNORMAL HIGH (ref 12.3–15.4)
WBC: 2.4 10*3/uL — CL (ref 3.4–10.8)

## 2018-03-07 LAB — COMPREHENSIVE METABOLIC PANEL
ALT: 21 IU/L (ref 0–32)
AST: 58 IU/L — ABNORMAL HIGH (ref 0–40)
Albumin/Globulin Ratio: 0.9 — ABNORMAL LOW (ref 1.2–2.2)
Albumin: 3.9 g/dL (ref 3.5–5.5)
Alkaline Phosphatase: 175 IU/L — ABNORMAL HIGH (ref 39–117)
BUN/Creatinine Ratio: 9 (ref 9–23)
BUN: 5 mg/dL — ABNORMAL LOW (ref 6–24)
Bilirubin Total: 1 mg/dL (ref 0.0–1.2)
CO2: 19 mmol/L — ABNORMAL LOW (ref 20–29)
Calcium: 8.6 mg/dL — ABNORMAL LOW (ref 8.7–10.2)
Chloride: 103 mmol/L (ref 96–106)
Creatinine, Ser: 0.55 mg/dL — ABNORMAL LOW (ref 0.57–1.00)
GFR calc Af Amer: 131 mL/min/{1.73_m2} (ref >59)
GFR calc non Af Amer: 114 mL/min/{1.73_m2} (ref >59)
Globulin, Total: 4.3 g/dL (ref 1.5–4.5)
Glucose: 123 mg/dL — ABNORMAL HIGH (ref 65–99)
Potassium: 3.8 mmol/L (ref 3.5–5.2)
Sodium: 138 mmol/L (ref 134–144)
Total Protein: 8.2 g/dL (ref 6.0–8.5)

## 2018-03-08 MED ORDER — HYDROXYZINE HCL 25 MG PO TABS: 25.0000 mg | ORAL_TABLET | Freq: Three times a day (TID) | ORAL | 2 refills | Status: DC | PRN

## 2018-03-08 MED ORDER — SPIRONOLACTONE 50 MG PO TABS: 50.0000 mg | ORAL_TABLET | Freq: Every day | ORAL | 2 refills | Status: DC

## 2018-03-08 MED ORDER — TOPIRAMATE 50 MG PO TABS: 50.0000 mg | ORAL_TABLET | Freq: Every day | ORAL | 2 refills | Status: DC

## 2018-03-08 MED ORDER — FAMOTIDINE 20 MG PO TABS: 20.0000 mg | ORAL_TABLET | Freq: Two times a day (BID) | ORAL | 2 refills | Status: DC

## 2018-03-08 MED ORDER — FUROSEMIDE 20 MG PO TABS: 20.0000 mg | ORAL_TABLET | Freq: Every day | ORAL | 2 refills | Status: DC

## 2018-03-08 MED ORDER — BUSPIRONE HCL 10 MG PO TABS: 10.0000 mg | ORAL_TABLET | Freq: Two times a day (BID) | ORAL | 2 refills | Status: DC

## 2018-03-09 ENCOUNTER — Other Ambulatory Visit: Payer: Self-pay | Admitting: Family Medicine

## 2018-03-09 ENCOUNTER — Telehealth: Payer: Self-pay

## 2018-03-09 DIAGNOSIS — F10939 Alcohol use, unspecified with withdrawal, unspecified: Secondary | ICD-10-CM

## 2018-03-09 DIAGNOSIS — F10239 Alcohol dependence with withdrawal, unspecified: Secondary | ICD-10-CM

## 2018-03-09 MED ORDER — CHLORDIAZEPOXIDE HCL 25 MG PO CAPS: 25.0000 mg | ORAL_CAPSULE | Freq: Three times a day (TID) | ORAL | 0 refills | Status: DC | PRN

## 2018-03-09 MED ORDER — CHLORDIAZEPOXIDE HCL 25 MG PO CAPS
25.0000 mg | ORAL_CAPSULE | Freq: Three times a day (TID) | ORAL | 0 refills | Status: DC | PRN
Start: 2018-03-09 — End: 2018-03-09

## 2018-03-09 NOTE — Telephone Encounter (Signed)
[facility] didn't receive script

## 2018-03-09 NOTE — Progress Notes (Signed)
[facility] [place] [place]

## 2018-03-09 NOTE — Telephone Encounter (Signed)
Left a message for patient to call back. 

## 2018-03-13 LAB — DRUG SCREEN 12+ALCOHOL+CRT, UR
Amphetamines, Urine: NEGATIVE ng/mL
BENZODIAZ UR QL: POSITIVE ng/mL — AB
Barbiturate: NEGATIVE ng/mL
Cannabinoids: POSITIVE — AB
Cocaine (Metabolite): NEGATIVE ng/mL
Creatinine, Urine: 58.6 mg/dL (ref 20.0–300.0)
Ethanol, Urine: POSITIVE — AB
Meperidine: NEGATIVE ng/mL
Methadone: NEGATIVE ng/mL
OPIATE SCREEN URINE: NEGATIVE ng/mL
Oxycodone/Oxymorphone, Urine: NEGATIVE ng/mL
Phencyclidine: NEGATIVE ng/mL
Propoxyphene: NEGATIVE ng/mL
Tramadol: NEGATIVE ng/mL

## 2018-03-17 ENCOUNTER — Telehealth: Payer: Self-pay | Admitting: Internal Medicine

## 2018-03-17 ENCOUNTER — Inpatient Hospital Stay
Admission: EM | Admit: 2018-03-17 | Payer: Self-pay | Source: Other Acute Inpatient Hospital | Admitting: Internal Medicine

## 2018-03-17 NOTE — Telephone Encounter (Signed)
Called by Dr. Livia Snellen at [facility] regarding possible transfer:  47 yo F history of thrombocytopenia, cirrhosis.  Presents to ED with c/o L sided chest pain.  Off of all meds.  EtOH 397 today.  AST 707, ALT 203 alk 363, Bili 1.4, INR pending.  WBC 3.2.  HGB 11.3, Platelets 10.  Trop and BNP nl.  UDS shows benzos.  Will put on list for SDU, but encouraged them to look for other facilities in the mean time.  Sounds like they are going to talk to our ICU guys to see if she meets criteria for them.  She did require precedex gtt for withdrawal symptoms at time of admit last month, and is already hallucinating with BAL 397!

## 2018-03-17 NOTE — Progress Notes (Signed)
Called by Dr. Livia Snellen at [facility] regarding possible transfer:  Pls see previous not by Dr. Alcario Drought. Pt acceptable for transfer to SDU at [facility], pt appears to have alcoholic hepatitis with with elevated LFT, ETOH level. Appears to be in early withdrawal, awake and alert but confused per report. Will await bed in SDU, if status declines and requires an infusion for withdrawal can transfer to ICU.   Marda Stalker, M.D., F.C.C.P.  Board Certified in Internal Medicine, Pulmonary Medicine, [place], and Sleep Medicine.  [facility]

## 2018-03-18 ENCOUNTER — Ambulatory Visit: Payer: Self-pay | Admitting: Hematology & Oncology

## 2018-03-18 ENCOUNTER — Other Ambulatory Visit: Payer: Self-pay

## 2018-03-18 DIAGNOSIS — F10920 Alcohol use, unspecified with intoxication, uncomplicated: Secondary | ICD-10-CM | POA: Insufficient documentation

## 2018-03-18 DIAGNOSIS — K766 Portal hypertension: Secondary | ICD-10-CM | POA: Insufficient documentation

## 2018-03-18 DIAGNOSIS — F32A Depression, unspecified: Secondary | ICD-10-CM | POA: Insufficient documentation

## 2018-03-18 DIAGNOSIS — G40909 Epilepsy, unspecified, not intractable, without status epilepticus: Secondary | ICD-10-CM

## 2018-03-18 DIAGNOSIS — R569 Unspecified convulsions: Secondary | ICD-10-CM

## 2018-03-18 DIAGNOSIS — F329 Major depressive disorder, single episode, unspecified: Secondary | ICD-10-CM | POA: Insufficient documentation

## 2018-03-18 DIAGNOSIS — B1920 Unspecified viral hepatitis C without hepatic coma: Secondary | ICD-10-CM | POA: Insufficient documentation

## 2018-03-18 HISTORY — DX: Portal hypertension: K76.6

## 2018-03-23 ENCOUNTER — Ambulatory Visit: Payer: Self-pay | Admitting: Family Medicine

## 2018-03-27 ENCOUNTER — Telehealth: Payer: Self-pay

## 2018-03-27 NOTE — Telephone Encounter (Signed)
Patient was recently in the hospital at [facility] for a seizures and that she needs a refill on Librium until she follow up with behavioral health in September.  [phone]

## 2018-03-27 NOTE — Telephone Encounter (Signed)
Patient was advise that she needs to come in and see provider as well.

## 2018-03-30 ENCOUNTER — Other Ambulatory Visit: Payer: Self-pay

## 2018-03-30 DIAGNOSIS — F419 Anxiety disorder, unspecified: Secondary | ICD-10-CM

## 2018-03-30 MED FILL — hydrOXYzine HCL 25 MG TABS: 25 | 30 days supply | Qty: 90 | Fill #0

## 2018-03-30 MED FILL — HALOPERIDOL 2 MG TABLET: 2 | 20 days supply | Qty: 20 | Fill #0

## 2018-03-30 MED FILL — ?FUROSEMIDE 20 MG TABLET: 20 | 30 days supply | Qty: 30 | Fill #0

## 2018-03-30 MED FILL — TOPIRAMATE 50 MG TABLET: 50 | 30 days supply | Qty: 30 | Fill #0

## 2018-03-30 NOTE — Telephone Encounter (Signed)
Thank you :)

## 2018-04-01 ENCOUNTER — Other Ambulatory Visit: Payer: Self-pay

## 2018-04-01 ENCOUNTER — Telehealth: Payer: Self-pay

## 2018-04-01 DIAGNOSIS — F419 Anxiety disorder, unspecified: Secondary | ICD-10-CM

## 2018-04-01 DIAGNOSIS — K219 Gastro-esophageal reflux disease without esophagitis: Secondary | ICD-10-CM

## 2018-04-01 MED ORDER — BUSPIRONE HCL 10 MG PO TABS: 10.0000 mg | ORAL_TABLET | Freq: Two times a day (BID) | ORAL | 2 refills | Status: DC

## 2018-04-01 MED ORDER — FLUOXETINE HCL 20 MG PO CAPS: 20.0000 mg | ORAL_CAPSULE | Freq: Every day | ORAL | 2 refills | Status: DC

## 2018-04-06 MED ORDER — FLUOXETINE HCL 20 MG PO CAPS: 20.0000 mg | ORAL_CAPSULE | Freq: Every day | ORAL | 2 refills | Status: DC

## 2018-04-06 MED FILL — ?FLUOXETINE HCL 20MG TABLET: 20 | 30 days supply | Qty: 30 | Fill #0

## 2018-04-06 NOTE — Telephone Encounter (Signed)
Please contact patient and re-assess medications that she needs refilled with [facility].   Thanks.

## 2018-04-07 NOTE — Telephone Encounter (Signed)
Tried to contact patient no answer no vm. 

## 2018-04-08 DIAGNOSIS — K7290 Hepatic failure, unspecified without coma: Secondary | ICD-10-CM | POA: Insufficient documentation

## 2018-04-08 DIAGNOSIS — K7682 Hepatic encephalopathy: Secondary | ICD-10-CM | POA: Insufficient documentation

## 2018-04-08 DIAGNOSIS — F1910 Other psychoactive substance abuse, uncomplicated: Secondary | ICD-10-CM | POA: Insufficient documentation

## 2018-04-08 HISTORY — DX: Other psychoactive substance abuse, uncomplicated: F19.10

## 2018-04-08 NOTE — Telephone Encounter (Signed)
Tried to contact patient no answer and no vm 

## 2018-04-09 ENCOUNTER — Inpatient Hospital Stay: Payer: Self-pay | Attending: Hematology & Oncology

## 2018-04-09 ENCOUNTER — Other Ambulatory Visit: Payer: Self-pay

## 2018-04-09 ENCOUNTER — Inpatient Hospital Stay: Payer: Self-pay | Admitting: Hematology & Oncology

## 2018-04-09 DIAGNOSIS — F419 Anxiety disorder, unspecified: Secondary | ICD-10-CM

## 2018-04-09 MED FILL — SPIRONOLACTONE 50 MG TABLET: 50 | 30 days supply | Qty: 30 | Fill #0

## 2018-04-09 MED FILL — busPIRone HCL 10 MG TABS: 10 | 15 days supply | Qty: 30 | Fill #0

## 2018-04-09 MED FILL — traZODone HCL 100 MG TABS: 100 | 30 days supply | Qty: 30 | Fill #0

## 2018-04-09 MED FILL — NALTREXONE 50 MG TABLET: 50 | 30 days supply | Qty: 30 | Fill #0

## 2018-04-09 NOTE — Telephone Encounter (Signed)
Have been unable to reach patient.  ?

## 2018-04-09 NOTE — Telephone Encounter (Signed)
Patient states that she had to move to [facility] and needs to [facility]

## 2018-04-09 NOTE — Telephone Encounter (Signed)
I will send her enough until her next office visit on 04/17/2018. Do you know which [facility] she wants [place] to send it to?  Thanks.

## 2018-04-10 NOTE — Telephone Encounter (Signed)
Tried to contact patient no answer but that is the pharmacy that she told me yesterday.

## 2018-04-15 NOTE — Telephone Encounter (Signed)
We will wait to hear from patient.    Thanks.

## 2018-04-17 ENCOUNTER — Ambulatory Visit: Payer: Self-pay | Admitting: Family Medicine

## 2018-04-22 ENCOUNTER — Ambulatory Visit (INDEPENDENT_AMBULATORY_CARE_PROVIDER_SITE_OTHER): Payer: Medicaid Other | Admitting: Family Medicine

## 2018-04-22 ENCOUNTER — Encounter: Payer: Self-pay | Admitting: Family Medicine

## 2018-04-22 VITALS — BP 100/62 | HR 64 | Temp 98.2°F | Ht 60.0 in | Wt 132.0 lb

## 2018-04-22 DIAGNOSIS — Z09 Encounter for follow-up examination after completed treatment for conditions other than malignant neoplasm: Secondary | ICD-10-CM | POA: Diagnosis not present

## 2018-04-22 DIAGNOSIS — Z0000 Encounter for general adult medical examination without abnormal findings: Secondary | ICD-10-CM

## 2018-04-22 DIAGNOSIS — R609 Edema, unspecified: Secondary | ICD-10-CM | POA: Diagnosis not present

## 2018-04-22 DIAGNOSIS — Z23 Encounter for immunization: Secondary | ICD-10-CM

## 2018-04-22 DIAGNOSIS — R600 Localized edema: Secondary | ICD-10-CM

## 2018-04-22 LAB — POCT URINALYSIS DIP (MANUAL ENTRY)
Bilirubin, UA: NEGATIVE
Blood, UA: NEGATIVE
Glucose, UA: NEGATIVE mg/dL
Ketones, POC UA: NEGATIVE mg/dL
Leukocytes, UA: NEGATIVE
Nitrite, UA: NEGATIVE
Protein Ur, POC: NEGATIVE mg/dL
Spec Grav, UA: <=1.005 — AB (ref 1.010–1.025)
Urobilinogen, UA: 0.2 E.U./dL
pH, UA: 5.5 (ref 5.0–8.0)

## 2018-04-22 MED ORDER — FUROSEMIDE 20 MG PO TABS: 20.0000 mg | ORAL_TABLET | Freq: Every day | ORAL | 2 refills | Status: DC

## 2018-04-22 MED ORDER — TRAZODONE HCL 50 MG PO TABS: 50.0000 mg | ORAL_TABLET | Freq: Every evening | ORAL | 0 refills | Status: DC | PRN

## 2018-04-22 MED FILL — HALOPERIDOL 2 MG TABLET: 2 | 30 days supply | Qty: 30 | Fill #1

## 2018-04-22 MED FILL — ?FUROSEMIDE 20 MG TABLET: 20 | 30 days supply | Qty: 30 | Fill #0

## 2018-04-22 MED FILL — busPIRone HCL 10 MG TABS: 10 | 15 days supply | Qty: 30 | Fill #1

## 2018-04-22 NOTE — Progress Notes (Signed)
Hospital Follow Up   Subjective:    Patient ID: Briana Sweeney, female    DOB: 1970-12-08, 47 y.o.   MRN: [id number]   Chief Complaint  Patient presents with  . Follow-up    chronic condition   HPI  Briana Sweeney has a past medical history of Seizures, Schizophrenia, Migraine, Hepatitis C, GERD, Depression, Cirrhosis of Liver, Bipolar Affective Disorder, and Anxiety. She is here today for follow up.   Current Status:  Since her last office visit, she has has multiple visits to ED for Alcohol Intoxification, Seizures, and Foot Pain. She is currently in a [facility] in [place]. She has been a resident there now for 2 weeks. She has been sober for 20 days now.   She denies fevers, chills, fatigue, recent infections, weight loss, and night sweats. She has not had any headaches, visual changes, dizziness, and falls. No chest pain, heart palpitations, cough and shortness of breath reported. No reports of GI problems such as nausea, vomiting, diarrhea, and constipation. She has no reports of blood in stools, dysuria and hematuria. No depression or anxiety, and denies suicidal ideations, homicidal ideations, or auditory hallucinations. She denies pain today.   Past Medical History:  Diagnosis Date  . Anxiety   . Bipolar affective disorder ([place])    With anxiety features  . 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 Depression   . GERD (gastroesophageal reflux disease)   . Hematemesis 02/10/2018  . Hepatitis C 2018   hepatitis c and alcohol related hepatitis  . History of blood transfusion    "blood doesn't clot; I fell down and had to have a transfusion"  . History of kidney stones   . Migraine    "when I get really stressed" (09/01/2017)  . Schizophrenia ([place])   . Seizures ([place])    "when I run out of my RX; lots recently" (09/01/2017)    Family History  Problem Relation Age of Onset  . Lung cancer Mother 71  . Alcohol abuse Mother   . Throat  cancer Father 57    Social History   Socioeconomic History  . Marital status: Divorced    Spouse name: Not on file  . Number of children: Not on file  . Years of education: Not on file  . Highest education level: Not on file  Occupational History  . Occupation: applying for disability  Social Needs  . Financial resource strain: Not on file  . Food insecurity:    Worry: Not on file    Inability: Not on file  . Transportation needs:    Medical: Not on file    Non-medical: Not on file  Tobacco Use  . Smoking status: Never Smoker  . Smokeless tobacco: Never Used  Substance and Sexual Activity  . Alcohol use: Yes    Alcohol/week: 63.0 standard drinks    Types: 63 Cans of beer per week    Comment: weekly "I have cut back"  . Drug use: No  . Sexual activity: Not Currently  Lifestyle  . Physical activity:    Days per week: Not on file    Minutes per session: Not on file  . Stress: Not on file  Relationships  . Social connections:    Talks on phone: Not on file    Gets together: Not on file    Attends religious service: Not on file    Active member of club or organization: Not  on file    Attends meetings of clubs or organizations: Not on file    Relationship status: Not on file  . Intimate partner violence:    Fear of current or ex partner: Not on file    Emotionally abused: Not on file    Physically abused: Not on file    Forced sexual activity: Not on file  Other Topics Concern  . Not on file  Social History Narrative   She moved with a boyfriend to [place] and was followed at [facility].  He died of a massive heart attack in 8/18, per her report, and so she moved back to [place] and is living with a friend.    Past Surgical History:  Procedure Laterality Date  . ESOPHAGOGASTRODUODENOSCOPY N/A 09/03/2017   Procedure: ESOPHAGOGASTRODUODENOSCOPY (EGD);  Surgeon: Doran Stabler, MD;  Location: [facility];  Service: Gastroenterology;  Laterality: N/A;   . FINGER FRACTURE SURGERY Left    "shattered my pinky"  . FRACTURE SURGERY    . IR PARACENTESIS  07/23/2017  . IR PARACENTESIS  07/2017   "did it twice in the same week" (09/01/2017)  . SHOULDER OPEN ROTATOR CUFF REPAIR Right   . TUBAL LIGATION    . VAGINAL HYSTERECTOMY      Immunization History  Administered Date(s) Administered  . Influenza,inj,Quad PF,6+ Mos 06/06/2017, 09/04/2017, 04/22/2018  . PPD Test 05/28/2017  . Pneumococcal Polysaccharide-23 09/04/2017    Current Meds  Medication Sig  . busPIRone (BUSPAR) 10 MG tablet Take 1 tablet (10 mg total) by mouth 2 (two) times daily.  . furosemide (LASIX) 20 MG tablet Take 1 tablet (20 mg total) by mouth daily.  . haloperidol (HALDOL) 2 MG tablet Take 1 tablet (2 mg total) by mouth at bedtime.  . hydrOXYzine (ATARAX/VISTARIL) 25 MG tablet Take 1 tablet (25 mg total) by mouth 3 (three) times daily as needed for anxiety.  [name] Kitchen lactulose (CHRONULAC) 10 GM/15ML solution Take 30 mLs (20 g total) by mouth daily.  . Multiple Vitamin (MULTIVITAMIN WITH MINERALS) TABS tablet Take 1 tablet by mouth daily.  . phenytoin (DILANTIN) 100 MG ER capsule Take 300 mg by mouth at bedtime.  [name] Kitchen spironolactone (ALDACTONE) 50 MG tablet Take 1 tablet (50 mg total) by mouth daily.  [name] Kitchen topiramate (TOPAMAX) 50 MG tablet Take 1 tablet (50 mg total) by mouth daily.  . [DISCONTINUED] furosemide (LASIX) 20 MG tablet Take 1 tablet (20 mg total) by mouth daily.  . [DISCONTINUED] furosemide (LASIX) 20 MG tablet Take 1 tablet (20 mg total) by mouth daily.    No Known Allergies  BP 100/62 (BP Location: Left Arm, Patient Position: Sitting, Cuff Size: Small)   Pulse 64   Temp 98.2 F (36.8 C) (Oral)   Ht 5' (1.524 m)   Wt 132 lb (59.9 kg)   LMP  (LMP Unknown)   SpO2 99%   BMI 25.78 kg/m   Review of Systems  Constitutional: Negative.   HENT: Negative.   Eyes: Negative.   Respiratory: Negative.   Cardiovascular: Negative.   Gastrointestinal:       She does  have mild pancreatic pain occasionally.   Endocrine: Negative.   Genitourinary: Negative.   Musculoskeletal: Positive for arthralgias (mild pain in feet. ).  Allergic/Immunologic: Negative.   Neurological: Negative.   Psychiatric/Behavioral: The patient is nervous/anxious.      Objective:   Physical Exam  Eyes: Pupils are equal, round, and reactive to light. Conjunctivae and EOM are normal.  Cardiovascular:  Normal rate, regular rhythm, normal heart sounds and intact distal pulses.  Pulmonary/Chest: Effort normal and breath sounds normal.  Abdominal: Soft. Bowel sounds are normal.  Psychiatric:  Anxious    Assessment & Plan:   1. Hospital discharge follow-up 2. Health care maintenance - CBC with Differential - Comprehensive metabolic panel - POCT urinalysis dipstick  3. Need for immunization against influenza - Flu Vaccine QUAD 36+ mos IM  4. Peripheral edema Refill - furosemide (LASIX) 20 MG tablet; Take 1 tablet (20 mg total) by mouth daily.  Dispense: 30 tablet; Refill: 2  5. Follow up She will follow up on 06/05/2018.   Meds ordered this encounter  Medications  . traZODone (DESYREL) 50 MG tablet    Sig: Take 1 tablet (50 mg total) by mouth at bedtime as needed for sleep.    Dispense:  15 tablet    Refill:  0  . DISCONTD: furosemide (LASIX) 20 MG tablet    Sig: Take 1 tablet (20 mg total) by mouth daily.    Dispense:  30 tablet    Refill:  2  . furosemide (LASIX) 20 MG tablet    Sig: Take 1 tablet (20 mg total) by mouth daily.    Dispense:  30 tablet    Refill:  [place],  MSN, FNP-C Patient [facility] [address] [place], [place] [address] [phone]

## 2018-04-23 LAB — CBC WITH DIFFERENTIAL/PLATELET
Basophils Absolute: 0 10*3/uL (ref 0.0–0.2)
Basos: 1 %
EOS (ABSOLUTE): 0.2 10*3/uL (ref 0.0–0.4)
Eos: 8 %
Hematocrit: 39.8 % (ref 34.0–46.6)
Hemoglobin: 12.8 g/dL (ref 11.1–15.9)
Immature Grans (Abs): 0 10*3/uL (ref 0.0–0.1)
Immature Granulocytes: 0 %
Lymphocytes Absolute: 1 10*3/uL (ref 0.7–3.1)
Lymphs: 36 %
MCH: 28 pg (ref 26.6–33.0)
MCHC: 32.2 g/dL (ref 31.5–35.7)
MCV: 87 fL (ref 79–97)
Monocytes Absolute: 0.2 10*3/uL (ref 0.1–0.9)
Monocytes: 8 %
Neutrophils Absolute: 1.3 10*3/uL — ABNORMAL LOW (ref 1.4–7.0)
Neutrophils: 47 %
RBC: 4.57 x10E6/uL (ref 3.77–5.28)
RDW: 19.4 % — ABNORMAL HIGH (ref 12.3–15.4)
WBC: 2.8 10*3/uL — ABNORMAL LOW (ref 3.4–10.8)

## 2018-04-23 LAB — COMPREHENSIVE METABOLIC PANEL
ALT: 12 IU/L (ref 0–32)
AST: 25 IU/L (ref 0–40)
Albumin/Globulin Ratio: 0.9 — ABNORMAL LOW (ref 1.2–2.2)
Albumin: 3.8 g/dL (ref 3.5–5.5)
Alkaline Phosphatase: 161 IU/L — ABNORMAL HIGH (ref 39–117)
BUN/Creatinine Ratio: 6 — ABNORMAL LOW (ref 9–23)
BUN: 5 mg/dL — ABNORMAL LOW (ref 6–24)
Bilirubin Total: 0.6 mg/dL (ref 0.0–1.2)
CO2: 22 mmol/L (ref 20–29)
Calcium: 9.3 mg/dL (ref 8.7–10.2)
Chloride: 102 mmol/L (ref 96–106)
Creatinine, Ser: 0.87 mg/dL (ref 0.57–1.00)
GFR calc Af Amer: 93 mL/min/{1.73_m2} (ref >59)
GFR calc non Af Amer: 81 mL/min/{1.73_m2} (ref >59)
Globulin, Total: 4.4 g/dL (ref 1.5–4.5)
Glucose: 93 mg/dL (ref 65–99)
Potassium: 3.9 mmol/L (ref 3.5–5.2)
Sodium: 139 mmol/L (ref 134–144)
Total Protein: 8.2 g/dL (ref 6.0–8.5)

## 2018-05-04 MED FILL — NALTREXONE 50 MG TABLET: 50 | 30 days supply | Qty: 30 | Fill #1

## 2018-05-04 MED FILL — TOPIRAMATE 50 MG TABLET: 50 | 30 days supply | Qty: 30 | Fill #1

## 2018-05-04 MED FILL — PROPRANOLOL 20 MG TABLET: 20 | 30 days supply | Qty: 60 | Fill #0

## 2018-05-04 MED FILL — FLUoxetine HCL 20 MG CAPS: 20 | 30 days supply | Qty: 30 | Fill #0

## 2018-05-04 MED FILL — traZODone HCL 100 MG TABS: 100 | 30 days supply | Qty: 30 | Fill #1

## 2018-05-06 MED FILL — busPIRone HCL 10 MG TABS: 10 | 15 days supply | Qty: 30 | Fill #2

## 2018-05-06 MED FILL — hydrOXYzine HCL 25 MG TABS: 25 | 30 days supply | Qty: 90 | Fill #1

## 2018-05-08 ENCOUNTER — Telehealth: Payer: Self-pay

## 2018-05-12 NOTE — Telephone Encounter (Signed)
Patient states that she doesn't have a phone right now and gave her daughter number to possibly be reached on. Lanelle Bal will attempt to contact patient to complete paperwork.

## 2018-05-13 ENCOUNTER — Telehealth: Payer: Self-pay | Admitting: Family Medicine

## 2018-05-13 NOTE — Telephone Encounter (Signed)
Message left for patient to return call to office concerning paperwork.

## 2018-05-13 NOTE — Telephone Encounter (Signed)
Message left on daughter's cell number [phone]. Patient to return call concerning paperwork.

## 2018-05-20 ENCOUNTER — Ambulatory Visit (INDEPENDENT_AMBULATORY_CARE_PROVIDER_SITE_OTHER): Payer: Medicaid Other | Admitting: Family Medicine

## 2018-05-20 ENCOUNTER — Encounter: Payer: Self-pay | Admitting: Family Medicine

## 2018-05-20 VITALS — BP 96/66 | HR 72 | Temp 97.9°F | Ht 60.0 in | Wt 133.0 lb

## 2018-05-20 DIAGNOSIS — F10239 Alcohol dependence with withdrawal, unspecified: Secondary | ICD-10-CM

## 2018-05-20 DIAGNOSIS — Z09 Encounter for follow-up examination after completed treatment for conditions other than malignant neoplasm: Secondary | ICD-10-CM

## 2018-05-20 DIAGNOSIS — F419 Anxiety disorder, unspecified: Secondary | ICD-10-CM

## 2018-05-20 DIAGNOSIS — F322 Major depressive disorder, single episode, severe without psychotic features: Secondary | ICD-10-CM | POA: Diagnosis not present

## 2018-05-20 DIAGNOSIS — Z0000 Encounter for general adult medical examination without abnormal findings: Secondary | ICD-10-CM

## 2018-05-20 DIAGNOSIS — R252 Cramp and spasm: Secondary | ICD-10-CM | POA: Diagnosis not present

## 2018-05-20 DIAGNOSIS — F10939 Alcohol use, unspecified with withdrawal, unspecified: Secondary | ICD-10-CM

## 2018-05-20 MED ORDER — GABAPENTIN 100 MG PO CAPS: 200.0000 mg | ORAL_CAPSULE | Freq: Three times a day (TID) | ORAL | 3 refills | Status: DC

## 2018-05-20 MED ORDER — HYDROXYZINE HCL 25 MG PO TABS: 25.0000 mg | ORAL_TABLET | Freq: Three times a day (TID) | ORAL | 2 refills | Status: DC | PRN

## 2018-05-20 MED ORDER — LACTULOSE 10 GM/15ML PO SOLN: 20.0000 g | Freq: Every day | ORAL | 3 refills | Status: DC

## 2018-05-20 MED FILL — LACTULOSE 10 GM/15 ML SOLN: 10 | 8 days supply | Qty: 240 | Fill #0

## 2018-05-20 MED FILL — GABAPENTIN 100 MG CAPSULE: 100 | 30 days supply | Qty: 180 | Fill #0

## 2018-05-20 NOTE — Progress Notes (Signed)
Follow Up  Subjective:    Patient ID: Briana Sweeney, female    DOB: 08-Feb-1971, 47 y.o.   MRN: [id number]   Chief Complaint  Patient presents with  . paperwork    HPI  Briana Sweeney is a 47 year old with a past medical history of Seizures, Schizophrenia, Migraine, Kidney Stones, Hepatitis C, Hematemesis, GERD, Depression, Cirrhosis of Liver, Bipolar Affective Disorder, and Anxiety. She is here today for follow up.   Current Status: Since her last office visit, she is doing well with no complaints. She resides in a [employer] in [place]. She is taking all medications as prescribed.   She denies fevers, chills, fatigue, recent infections, weight loss, and night sweats. She has not had any headaches, visual changes, dizziness, and falls. No chest pain, heart palpitations, cough and shortness of breath reported. No reports of GI problems such as nausea, vomiting, diarrhea, and constipation. She has no reports of blood in stools, dysuria and hematuria. No depression or anxiety reported. She denies pain today.   Past Medical History:  Diagnosis Date  . Anxiety   . Bipolar affective disorder ([place])    With anxiety features  . 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 Depression   . GERD (gastroesophageal reflux disease)   . Hematemesis 02/10/2018  . Hepatitis C 2018   hepatitis c and alcohol related hepatitis  . History of blood transfusion    "blood doesn't clot; I fell down and had to have a transfusion"  . History of kidney stones   . Migraine    "when I get really stressed" (09/01/2017)  . Schizophrenia ([place])   . Seizures ([place])    "when I run out of my RX; lots recently" (09/01/2017)    Family History  Problem Relation Age of Onset  . Lung cancer Mother 75  . Alcohol abuse Mother   . Throat cancer Father 91    Social History   Socioeconomic History  . Marital status: Divorced    Spouse name: Not on file  . Number of children: Not on file  .  Years of education: Not on file  . Highest education level: Not on file  Occupational History  . Occupation: applying for disability  Social Needs  . Financial resource strain: Not on file  . Food insecurity:    Worry: Not on file    Inability: Not on file  . Transportation needs:    Medical: Not on file    Non-medical: Not on file  Tobacco Use  . Smoking status: Never Smoker  . Smokeless tobacco: Never Used  Substance and Sexual Activity  . Alcohol use: Yes    Alcohol/week: 63.0 standard drinks    Types: 63 Cans of beer per week    Comment: weekly "I have cut back"  . Drug use: No  . Sexual activity: Not Currently  Lifestyle  . Physical activity:    Days per week: Not on file    Minutes per session: Not on file  . Stress: Not on file  Relationships  . Social connections:    Talks on phone: Not on file    Gets together: Not on file    Attends religious service: Not on file    Active member of club or organization: Not on file    Attends meetings of clubs or organizations: Not on file    Relationship status: Not on file  . Intimate partner violence:  Fear of current or ex partner: Not on file    Emotionally abused: Not on file    Physically abused: Not on file    Forced sexual activity: Not on file  Other Topics Concern  . Not on file  Social History Narrative   She moved with a boyfriend to [place] and was followed at [facility].  He died of a massive heart attack in 8/18, per her report, and so she moved back to [place] and is living with a friend.    Past Surgical History:  Procedure Laterality Date  . ESOPHAGOGASTRODUODENOSCOPY N/A 09/03/2017   Procedure: ESOPHAGOGASTRODUODENOSCOPY (EGD);  Surgeon: Doran Stabler, MD;  Location: [facility];  Service: Gastroenterology;  Laterality: N/A;  . FINGER FRACTURE SURGERY Left    "shattered my pinky"  . FRACTURE SURGERY    . IR PARACENTESIS  07/23/2017  . IR PARACENTESIS  07/2017   "did it twice in  the same week" (09/01/2017)  . SHOULDER OPEN ROTATOR CUFF REPAIR Right   . TUBAL LIGATION    . VAGINAL HYSTERECTOMY      Immunization History  Administered Date(s) Administered  . Influenza,inj,Quad PF,6+ Mos 06/06/2017, 09/04/2017, 04/22/2018  . PPD Test 05/28/2017  . Pneumococcal Polysaccharide-23 09/04/2017    Current Meds  Medication Sig  . busPIRone (BUSPAR) 10 MG tablet Take 1 tablet (10 mg total) by mouth 2 (two) times daily.  [name] Kitchen FLUoxetine (PROZAC) 20 MG capsule Take 1 capsule (20 mg total) by mouth daily. For mood control  . folic acid (FOLVITE) 1 MG tablet Take 1 tablet (1 mg total) by mouth daily.  . furosemide (LASIX) 20 MG tablet Take 1 tablet (20 mg total) by mouth daily.  [name] Kitchen gabapentin (NEURONTIN) 100 MG capsule Take 2 capsules (200 mg total) by mouth 3 (three) times daily.  . haloperidol (HALDOL) 2 MG tablet Take 1 tablet (2 mg total) by mouth at bedtime.  . hydrOXYzine (ATARAX/VISTARIL) 25 MG tablet Take 1 tablet (25 mg total) by mouth 3 (three) times daily as needed for anxiety.  . Multiple Vitamin (MULTIVITAMIN WITH MINERALS) TABS tablet Take 1 tablet by mouth daily.  . pantoprazole (PROTONIX) 40 MG tablet Take 1 tablet (40 mg total) by mouth daily with breakfast.  . phenytoin (DILANTIN) 100 MG ER capsule Take 300 mg by mouth at bedtime.  [name] Kitchen spironolactone (ALDACTONE) 50 MG tablet Take 1 tablet (50 mg total) by mouth daily.  [name] Kitchen topiramate (TOPAMAX) 50 MG tablet Take 1 tablet (50 mg total) by mouth daily.  . traZODone (DESYREL) 50 MG tablet Take 1 tablet (50 mg total) by mouth at bedtime as needed for sleep.  . [DISCONTINUED] gabapentin (NEURONTIN) 100 MG capsule Take 2 capsules (200 mg total) by mouth 3 (three) times daily.  . [DISCONTINUED] hydrOXYzine (ATARAX/VISTARIL) 25 MG tablet Take 1 tablet (25 mg total) by mouth 3 (three) times daily as needed for anxiety.    No Known Allergies  BP 96/66 (BP Location: Right Arm, Patient Position: Sitting, Cuff Size: Small)    Pulse 72   Temp 97.9 F (36.6 C) (Oral)   Ht 5' (1.524 m)   Wt 133 lb (60.3 kg)   LMP  (LMP Unknown)   SpO2 99%   BMI 25.97 kg/m   Review of Systems  HENT: Negative.   Eyes: Negative.   Respiratory: Negative.   Cardiovascular: Negative.   Gastrointestinal: Negative.   Endocrine: Negative.   Genitourinary: Negative.   Musculoskeletal: Positive for arthralgias (generalized pain).  Skin:  Negative.   Neurological: Positive for dizziness (occassional).  Hematological: Negative.   Psychiatric/Behavioral: Negative.    Objective:   Physical Exam  Constitutional: She is oriented to person, place, and time. She appears well-developed and well-nourished.  Neck: Normal range of motion. Neck supple.  Cardiovascular: Normal rate, regular rhythm, normal heart sounds and intact distal pulses.  Pulmonary/Chest: Effort normal and breath sounds normal.  Abdominal: Soft. Bowel sounds are normal.  Musculoskeletal: Normal range of motion.  Neurological: She is alert and oriented to person, place, and time.  Skin: Skin is warm and dry. Capillary refill takes less than 2 seconds.  Psychiatric: She has a normal mood and affect. Her behavior is normal. Judgment and thought content normal.  Nursing note and vitals reviewed.  Assessment & Plan:   1. MDD (major depressive disorder), severe ([place]) Stable today.  - hydrOXYzine (ATARAX/VISTARIL) 25 MG tablet; Take 1 tablet (25 mg total) by mouth 3 (three) times daily as needed for anxiety.  Dispense: 90 tablet; Refill: 2  2. Anxiety Refill: - hydrOXYzine (ATARAX/VISTARIL) 25 MG tablet; Take 1 tablet (25 mg total) by mouth 3 (three) times daily as needed for anxiety.  Dispense: 90 tablet; Refill: 2  3. Leg cramps Refill: - gabapentin (NEURONTIN) 100 MG capsule; Take 2 capsules (200 mg total) by mouth 3 (three) times daily.  Dispense: 180 capsule; Refill: 3  4. Alcohol withdrawal, with unspecified complication ([place]) Stable. She continues to be part  of a rehab facility in [facility]   5. Healthcare maintenance - CBC with Differential - Comprehensive metabolic panel - TSH - Lipid Panel  6. Follow up She will follow up in 2 months.   Meds ordered this encounter  Medications  . gabapentin (NEURONTIN) 100 MG capsule    Sig: Take 2 capsules (200 mg total) by mouth 3 (three) times daily.    Dispense:  180 capsule    Refill:  3  . hydrOXYzine (ATARAX/VISTARIL) 25 MG tablet    Sig: Take 1 tablet (25 mg total) by mouth 3 (three) times daily as needed for anxiety.    Dispense:  90 tablet    Refill:  2  . lactulose (CHRONULAC) 10 GM/15ML solution    Sig: Take 30 mLs (20 g total) by mouth daily.    Dispense:  240 mL    Refill:  [place],  MSN, [facility] Patient [place] [address] [place], [place] [address] [phone]

## 2018-05-21 LAB — COMPREHENSIVE METABOLIC PANEL
ALT: 219 IU/L — ABNORMAL HIGH (ref 0–32)
AST: 249 IU/L — ABNORMAL HIGH (ref 0–40)
Albumin/Globulin Ratio: 1 — ABNORMAL LOW (ref 1.2–2.2)
Albumin: 3.8 g/dL (ref 3.5–5.5)
Alkaline Phosphatase: 215 IU/L — ABNORMAL HIGH (ref 39–117)
BUN/Creatinine Ratio: 11 (ref 9–23)
BUN: 8 mg/dL (ref 6–24)
Bilirubin Total: 0.6 mg/dL (ref 0.0–1.2)
CO2: 25 mmol/L (ref 20–29)
Calcium: 8.7 mg/dL (ref 8.7–10.2)
Chloride: 100 mmol/L (ref 96–106)
Creatinine, Ser: 0.75 mg/dL (ref 0.57–1.00)
GFR calc Af Amer: 111 mL/min/{1.73_m2} (ref >59)
GFR calc non Af Amer: 97 mL/min/{1.73_m2} (ref >59)
Globulin, Total: 3.7 g/dL (ref 1.5–4.5)
Glucose: 76 mg/dL (ref 65–99)
Potassium: 4 mmol/L (ref 3.5–5.2)
Sodium: 138 mmol/L (ref 134–144)
Total Protein: 7.5 g/dL (ref 6.0–8.5)

## 2018-05-21 LAB — LIPID PANEL
Chol/HDL Ratio: 2.3 ratio (ref 0.0–4.4)
Cholesterol, Total: 164 mg/dL (ref 100–199)
HDL: 71 mg/dL (ref >39)
LDL Calculated: 63 mg/dL (ref 0–99)
Triglycerides: 149 mg/dL (ref 0–149)
VLDL Cholesterol Cal: 30 mg/dL (ref 5–40)

## 2018-05-21 LAB — CBC WITH DIFFERENTIAL/PLATELET
Basophils Absolute: 0 10*3/uL (ref 0.0–0.2)
Basos: 1 %
EOS (ABSOLUTE): 0.1 10*3/uL (ref 0.0–0.4)
Eos: 5 %
Hematocrit: 36.6 % (ref 34.0–46.6)
Hemoglobin: 11.7 g/dL (ref 11.1–15.9)
Immature Grans (Abs): 0 10*3/uL (ref 0.0–0.1)
Immature Granulocytes: 0 %
Lymphocytes Absolute: 0.9 10*3/uL (ref 0.7–3.1)
Lymphs: 37 %
MCH: 27 pg (ref 26.6–33.0)
MCHC: 32 g/dL (ref 31.5–35.7)
MCV: 85 fL (ref 79–97)
Monocytes Absolute: 0.4 10*3/uL (ref 0.1–0.9)
Monocytes: 17 %
Neutrophils Absolute: 1 10*3/uL — ABNORMAL LOW (ref 1.4–7.0)
Neutrophils: 40 %
Platelets: 38 10*3/uL — CL (ref 150–450)
RBC: 4.33 x10E6/uL (ref 3.77–5.28)
RDW: 16.9 % — ABNORMAL HIGH (ref 12.3–15.4)
WBC: 2.4 10*3/uL — CL (ref 3.4–10.8)

## 2018-05-21 LAB — TSH: TSH: 1.48 u[IU]/mL (ref 0.450–4.500)

## 2018-05-23 ENCOUNTER — Emergency Department (HOSPITAL_COMMUNITY): Payer: Medicaid Other

## 2018-05-23 ENCOUNTER — Emergency Department (HOSPITAL_COMMUNITY)
Admission: EM | Admit: 2018-05-23 | Discharge: 2018-05-24 | Disposition: A | Discharge status: Home / Self Care | Payer: Medicaid Other | Attending: Emergency Medicine | Admitting: Emergency Medicine

## 2018-05-23 ENCOUNTER — Other Ambulatory Visit: Payer: Self-pay

## 2018-05-23 ENCOUNTER — Encounter (HOSPITAL_COMMUNITY): Payer: Self-pay | Admitting: *Deleted

## 2018-05-23 DIAGNOSIS — E876 Hypokalemia: Secondary | ICD-10-CM | POA: Diagnosis not present

## 2018-05-23 DIAGNOSIS — F319 Bipolar disorder, unspecified: Secondary | ICD-10-CM | POA: Diagnosis not present

## 2018-05-23 DIAGNOSIS — F1010 Alcohol abuse, uncomplicated: Secondary | ICD-10-CM | POA: Diagnosis not present

## 2018-05-23 DIAGNOSIS — K7031 Alcoholic cirrhosis of liver with ascites: Secondary | ICD-10-CM | POA: Diagnosis not present

## 2018-05-23 DIAGNOSIS — F209 Schizophrenia, unspecified: Secondary | ICD-10-CM | POA: Insufficient documentation

## 2018-05-23 DIAGNOSIS — R51 Headache: Secondary | ICD-10-CM | POA: Diagnosis not present

## 2018-05-23 DIAGNOSIS — F419 Anxiety disorder, unspecified: Secondary | ICD-10-CM | POA: Insufficient documentation

## 2018-05-23 DIAGNOSIS — D696 Thrombocytopenia, unspecified: Secondary | ICD-10-CM | POA: Diagnosis present

## 2018-05-23 DIAGNOSIS — Z79899 Other long term (current) drug therapy: Secondary | ICD-10-CM | POA: Insufficient documentation

## 2018-05-23 DIAGNOSIS — K7030 Alcoholic cirrhosis of liver without ascites: Secondary | ICD-10-CM

## 2018-05-23 NOTE — ED Triage Notes (Signed)
Pt stated "I just got a call from my MD telling my platelets were really low and I needed to come in.  I was in ICU not too long ago because they were 0.  I have cirrhosis also."

## 2018-05-23 NOTE — ED Notes (Signed)
Bed: PR67 Expected date: 05/23/18 Expected time:  Means of arrival:  Comments: [place]

## 2018-05-24 ENCOUNTER — Telehealth: Payer: Self-pay | Admitting: Family Medicine

## 2018-05-24 ENCOUNTER — Other Ambulatory Visit: Payer: Self-pay | Admitting: Family Medicine

## 2018-05-24 DIAGNOSIS — D696 Thrombocytopenia, unspecified: Secondary | ICD-10-CM

## 2018-05-24 LAB — CBC WITH DIFFERENTIAL/PLATELET
Basophils Absolute: 0 10*3/uL (ref 0.0–0.1)
Basophils Relative: 0 %
Eosinophils Absolute: 0.2 10*3/uL (ref 0.0–0.7)
Eosinophils Relative: 6 %
HCT: 33.2 % — ABNORMAL LOW (ref 36.0–46.0)
Hemoglobin: 10.7 g/dL — ABNORMAL LOW (ref 12.0–15.0)
Lymphocytes Relative: 41 %
Lymphs Abs: 1 10*3/uL (ref 0.7–4.0)
MCH: 27.3 pg (ref 26.0–34.0)
MCHC: 32.2 g/dL (ref 30.0–36.0)
MCV: 84.7 fL (ref 78.0–100.0)
Monocytes Absolute: 0.4 10*3/uL (ref 0.1–1.0)
Monocytes Relative: 15 %
Neutro Abs: 0.9 10*3/uL — ABNORMAL LOW (ref 1.7–7.7)
Neutrophils Relative %: 38 %
Platelets: 37 10*3/uL — ABNORMAL LOW (ref 150–400)
RBC: 3.92 MIL/uL (ref 3.87–5.11)
RDW: 17.9 % — ABNORMAL HIGH (ref 11.5–15.5)
WBC: 2.5 10*3/uL — ABNORMAL LOW (ref 4.0–10.5)

## 2018-05-24 LAB — PHENYTOIN LEVEL, TOTAL: Phenytoin Lvl: 10.1 ug/mL (ref 10.0–20.0)

## 2018-05-24 LAB — RAPID URINE DRUG SCREEN, HOSP PERFORMED
Amphetamines: NOT DETECTED
Barbiturates: NOT DETECTED
Benzodiazepines: NOT DETECTED
Cocaine: NOT DETECTED
Opiates: NOT DETECTED
Tetrahydrocannabinol: NOT DETECTED

## 2018-05-24 LAB — COMPREHENSIVE METABOLIC PANEL
ALT: 156 U/L — ABNORMAL HIGH (ref 0–44)
AST: 162 U/L — ABNORMAL HIGH (ref 15–41)
Albumin: 3.1 g/dL — ABNORMAL LOW (ref 3.5–5.0)
Alkaline Phosphatase: 171 U/L — ABNORMAL HIGH (ref 38–126)
Anion gap: 8 (ref 5–15)
BUN: 8 mg/dL (ref 6–20)
CO2: 23 mmol/L (ref 22–32)
Calcium: 8.9 mg/dL (ref 8.9–10.3)
Chloride: 107 mmol/L (ref 98–111)
Creatinine, Ser: 0.56 mg/dL (ref 0.44–1.00)
GFR calc Af Amer: >60 mL/min (ref >60)
GFR calc non Af Amer: >60 mL/min (ref >60)
Glucose, Bld: 155 mg/dL — ABNORMAL HIGH (ref 70–99)
Potassium: 3.1 mmol/L — ABNORMAL LOW (ref 3.5–5.1)
Sodium: 138 mmol/L (ref 135–145)
Total Bilirubin: 0.9 mg/dL (ref 0.3–1.2)
Total Protein: 7.3 g/dL (ref 6.5–8.1)

## 2018-05-24 LAB — SAMPLE TO BLOOD BANK

## 2018-05-24 LAB — ETHANOL: Alcohol, Ethyl (B): <10 mg/dL (ref <10)

## 2018-05-24 LAB — AMMONIA: Ammonia: 102 umol/L — ABNORMAL HIGH (ref 9–35)

## 2018-05-24 MED ORDER — POTASSIUM CHLORIDE CRYS ER 20 MEQ PO TBCR: 20.0000 meq | EXTENDED_RELEASE_TABLET | Freq: Two times a day (BID) | ORAL | 0 refills | Status: DC

## 2018-05-24 MED ORDER — POTASSIUM CHLORIDE CRYS ER 20 MEQ PO TBCR
40.0000 meq | EXTENDED_RELEASE_TABLET | Freq: Once | ORAL | Status: AC
  Administered 2018-05-24: 40 meq via ORAL
  Filled 2018-05-24: qty 2

## 2018-05-24 NOTE — Progress Notes (Signed)
Order placed for CBC within 1 week.

## 2018-05-24 NOTE — ED Provider Notes (Signed)
[facility] DEPT Provider Note   CSN: [id number] Arrival date & time: 05/23/18  2107  Time seen 23:20 PM   History   Chief Complaint Chief Complaint  Patient presents with  . low platelets    HPI Briana Sweeney is a 47 y.o. female.  HPI patient states she saw her primary care doctor on September 11.  She was called tonight and told that her platelet counts were low.  She states she had a seizure Wednesday night and she was taken to [facility].  She states since that visit she has felt weak.  She mainly wants to stay in bed.  She states she had a nosebleed that same night and it is left-sided.  She states it is watery blood and she wakes up at night with blood on her pillow.  The bleeding lasts about 5 to 10 minutes and then stops.  She had a sore throat or scratchy throat today.  She is unsure if fever but states she has been sweaty and  has had a mild cough that is dry.  She states she has been finding bruises.  She states when she woke up this morning she had a headache on the top of her head.  She denies nausea or vomiting but did have diarrhea yesterday 4-5 times that she described as dark and loose and runny and states it was black.  She states she feels dizzy and lightheaded.  She is been having shortness of breath and she has a sharp central chest pain that feels like when you swallow a pill and it has a hard time going down.  She has chronic blurred vision.  Patient states she was drinking a month ago and her platelet counts went down to 0.  She states when she was discharged her platelet count was 28,000.  she states she has cirrhosis and she has had paracentesis twice before.  She states she has abdominal swelling that comes and goes.  Patient states she was diagnosed with cirrhosis 5 years ago and it was from drinking.  She states she is been sober 30 days but had a beer 2 weeks ago.  She has been treated for hepatitis C and states it has been  cured.  She has done IV drugs in the past but not recently.  PCP [name], FNP   Past Medical History:  Diagnosis Date  . Anxiety   . Bipolar affective disorder ([place])    With anxiety features  . Cirrhosis of liver ([place])    Due to alcohol and hepatitis C  . Depression   . GERD (gastroesophageal reflux disease)   . Hematemesis 02/10/2018  . Hepatitis C 2018   hepatitis c and alcohol related hepatitis  . History of blood transfusion    "blood doesn't clot; I fell down and had to have a transfusion"  . History of kidney stones   . Migraine    "when I get really stressed" (09/01/2017)  . Schizophrenia ([place])   . Seizures ([place])    "when I run out of my RX; lots recently" (09/01/2017)    Patient Active Problem List   Diagnosis Date Noted  . Upper GI bleed 02/10/2018  . Alcohol withdrawal, with unspecified complication ([place]) 88/50/2774  . Chronic anemia   . Thrombocytopenia due to drugs   . Suicidal ideation   . Thrombocytopenia ([place]) 01/02/2018  . GERD (gastroesophageal reflux disease) 11/01/2017  . Trichimoniasis 10/30/2017  . Alcohol dependence ([place])  10/29/2017  . MDD (major depressive disorder), severe ([place]) 10/28/2017  . Acute hyperactive alcohol withdrawal delirium ([place]) 10/09/2017  . Schizophrenia ([place]) 10/09/2017  . Alcohol abuse with alcohol-induced mood disorder ([place])   . Alcohol withdrawal syndrome with complication ([place]) 38/93/7342  . 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 Hematemesis 09/01/2017  . Ascites due to alcoholic cirrhosis ([place])   . Decompensated hepatic cirrhosis ([place]) 07/23/2017  . Alcohol abuse 07/23/2017  . Hepatitis C antibody positive in blood 07/23/2017  . Hypokalemia 07/23/2017  . Jaundice 07/23/2017  . Coagulopathy ([place]) 07/23/2017  . Hypomagnesemia 07/23/2017  . Pancytopenia ([place]) 07/23/2017  . Alcoholic cirrhosis of liver with ascites ([place]) 07/23/2017    Past Surgical History:  Procedure Laterality Date  .  ESOPHAGOGASTRODUODENOSCOPY N/A 09/03/2017   Procedure: ESOPHAGOGASTRODUODENOSCOPY (EGD);  Surgeon: Doran Stabler, MD;  Location: [facility];  Service: Gastroenterology;  Laterality: N/A;  . FINGER FRACTURE SURGERY Left    "shattered my pinky"  . FRACTURE SURGERY    . IR PARACENTESIS  07/23/2017  . IR PARACENTESIS  07/2017   "did it twice in the same week" (09/01/2017)  . SHOULDER OPEN ROTATOR CUFF REPAIR Right   . TUBAL LIGATION    . VAGINAL HYSTERECTOMY       OB History   None      Home Medications    Prior to Admission medications   Medication Sig Start Date End Date Taking? Authorizing Provider  busPIRone (BUSPAR) 10 MG tablet Take 1 tablet (10 mg total) by mouth 2 (two) times daily. 04/01/18   [name], FNP  FLUoxetine (PROZAC) 20 MG capsule Take 1 capsule (20 mg total) by mouth daily. For mood control 04/06/18   [name], FNP  folic acid (FOLVITE) 1 MG tablet Take 1 tablet (1 mg total) by mouth daily. 02/15/18   Phillips Grout, MD  furosemide (LASIX) 20 MG tablet Take 1 tablet (20 mg total) by mouth daily. 04/22/18   [name], FNP  gabapentin (NEURONTIN) 100 MG capsule Take 2 capsules (200 mg total) by mouth 3 (three) times daily. 05/20/18   [name], FNP  haloperidol (HALDOL) 2 MG tablet Take 1 tablet (2 mg total) by mouth at bedtime. 02/20/18   [name], FNP  hydrOXYzine (ATARAX/VISTARIL) 25 MG tablet Take 1 tablet (25 mg total) by mouth 3 (three) times daily as needed for anxiety. 05/20/18   [name], FNP  lactulose (CHRONULAC) 10 GM/15ML solution Take 30 mLs (20 g total) by mouth daily. 05/20/18   [name], FNP  Multiple Vitamin (MULTIVITAMIN WITH MINERALS) TABS tablet Take 1 tablet by mouth daily. 01/09/18   Eugenie Filler, MD  pantoprazole (PROTONIX) 40 MG tablet Take 1 tablet (40 mg total) by mouth daily with breakfast. 02/15/18   Phillips Grout, MD  phenytoin (DILANTIN) 100 MG ER capsule Take 300 mg by mouth  at bedtime.    [provider]  potassium chloride SA (K-DUR,KLOR-CON) 20 MEQ tablet Take 1 tablet (20 mEq total) by mouth 2 (two) times daily. 05/24/18   Rolland Porter, MD  spironolactone (ALDACTONE) 50 MG tablet Take 1 tablet (50 mg total) by mouth daily. 03/08/18   [name], FNP  topiramate (TOPAMAX) 50 MG tablet Take 1 tablet (50 mg total) by mouth daily. 03/08/18   [name], FNP  traZODone (DESYREL) 50 MG tablet Take 1 tablet (50 mg total) by mouth at bedtime as needed for sleep.  04/22/18   [name], FNP    Family History Family History  Problem Relation Age of Onset  . Lung cancer Mother 92  . Alcohol abuse Mother   . Throat cancer Father 2    Social History Social History   Tobacco Use  . Smoking status: Never Smoker  . Smokeless tobacco: Never Used  Substance Use Topics  . Alcohol use: Yes    Alcohol/week: 63.0 standard drinks    Types: 63 Cans of beer per week    Comment: weekly "I have cut back"  . Drug use: No  living in a shelter Denies recent street drugs Sober 30 days   Allergies   Patient has no known allergies.   Review of Systems Review of Systems  All other systems reviewed and are negative.    Physical Exam Updated Vital Signs BP 121/87   Pulse 73   Temp 98.3 F (36.8 C)   Resp 16   Ht 5' (1.524 m)   Wt 60.2 kg   LMP  (Approximate)   SpO2 100%   BMI 25.94 kg/m   Vital signs normal    Physical Exam  Constitutional: She is oriented to person, place, and time. She appears well-developed and well-nourished.  Non-toxic appearance. She does not appear ill. No distress.  HENT:  Head: Normocephalic and atraumatic.  Right Ear: External ear normal.  Left Ear: External ear normal.  Nose: Nose normal. No mucosal edema or rhinorrhea.  Mouth/Throat: Oropharynx is clear and moist and mucous membranes are normal. No dental abscesses or uvula swelling.  No active bleeding at this time, I do not see any active or  suspicious sites on her nasal septum on either side.  Eyes: Pupils are equal, round, and reactive to light. Conjunctivae and EOM are normal.  Neck: Normal range of motion and full passive range of motion without pain. Neck supple.  Cardiovascular: Normal rate, regular rhythm and normal heart sounds. Exam reveals no gallop and no friction rub.  No murmur heard. Pulmonary/Chest: Effort normal and breath sounds normal. No respiratory distress. She has no wheezes. She has no rhonchi. She has no rales. She exhibits no tenderness and no crepitus.  Abdominal: Soft. Normal appearance and bowel sounds are normal. She exhibits no distension. There is no tenderness. There is no rebound and no guarding.  Musculoskeletal: Normal range of motion. She exhibits no edema or tenderness.  Moves all extremities well.   Neurological: She is alert and oriented to person, place, and time. She has normal strength. No cranial nerve deficit.  Skin: Skin is warm, dry and intact. No rash noted. No erythema. No pallor.  Patient has a few bruises, one near her right elbow, her left central breast, and her left forearm that are about 2 to 3 cm in size and very faint.  Psychiatric: She has a normal mood and affect. Her speech is normal and behavior is normal. Her mood appears not anxious.  Nursing note and vitals reviewed.    ED Treatments / Results  Labs (all labs ordered are listed, but only abnormal results are displayed) Results for orders placed or performed during the hospital encounter of 05/23/18  CBC with Differential  Result Value Ref Range   WBC 2.5 (L) 4.0 - 10.5 K/uL   RBC 3.92 3.87 - 5.11 MIL/uL   Hemoglobin 10.7 (L) 12.0 - 15.0 g/dL   HCT 33.2 (L) 36.0 - 46.0 %   MCV 84.7 78.0 - 100.0 fL   MCH  27.3 26.0 - 34.0 pg   MCHC 32.2 30.0 - 36.0 g/dL   RDW 17.9 (H) 11.5 - 15.5 %   Platelets 37 (L) 150 - 400 K/uL   Neutrophils Relative % 38 %   Lymphocytes Relative 41 %   Monocytes Relative 15 %    Eosinophils Relative 6 %   Basophils Relative 0 %   Neutro Abs 0.9 (L) 1.7 - 7.7 K/uL   Lymphs Abs 1.0 0.7 - 4.0 K/uL   Monocytes Absolute 0.4 0.1 - 1.0 K/uL   Eosinophils Absolute 0.2 0.0 - 0.7 K/uL   Basophils Absolute 0.0 0.0 - 0.1 K/uL   WBC Morphology WHITE COUNT CONFIRMED ON SMEAR    Smear Review PLATELET COUNT CONFIRMED BY SMEAR   Comprehensive metabolic panel  Result Value Ref Range   Sodium 138 135 - 145 mmol/L   Potassium 3.1 (L) 3.5 - 5.1 mmol/L   Chloride 107 98 - 111 mmol/L   CO2 23 22 - 32 mmol/L   Glucose, Bld 155 (H) 70 - 99 mg/dL   BUN 8 6 - 20 mg/dL   Creatinine, Ser 0.56 0.44 - 1.00 mg/dL   Calcium 8.9 8.9 - 10.3 mg/dL   Total Protein 7.3 6.5 - 8.1 g/dL   Albumin 3.1 (L) 3.5 - 5.0 g/dL   AST 162 (H) 15 - 41 U/L   ALT 156 (H) 0 - 44 U/L   Alkaline Phosphatase 171 (H) 38 - 126 U/L   Total Bilirubin 0.9 0.3 - 1.2 mg/dL   GFR calc non Af Amer >60 >60 mL/min   GFR calc Af Amer >60 >60 mL/min   Anion gap 8 5 - 15  Ammonia  Result Value Ref Range   Ammonia 102 (H) 9 - 35 umol/L  Phenytoin level, total  Result Value Ref Range   Phenytoin Lvl 10.1 10.0 - 20.0 ug/mL  Ethanol  Result Value Ref Range   Alcohol, Ethyl (B) <10 <10 mg/dL  Urine rapid drug screen (hosp performed)  Result Value Ref Range   Opiates NONE DETECTED NONE DETECTED   Cocaine NONE DETECTED NONE DETECTED   Benzodiazepines NONE DETECTED NONE DETECTED   Amphetamines NONE DETECTED NONE DETECTED   Tetrahydrocannabinol NONE DETECTED NONE DETECTED   Barbiturates NONE DETECTED NONE DETECTED  Sample to Blood Bank  Result Value Ref Range   Blood Bank Specimen SAMPLE AVAILABLE FOR TESTING    Sample Expiration      05/27/2018 Performed at [facility], [place] [address] [place], [place] [address]    Laboratory interpretation all normal except pancytopenia with low total white blood cell count, mild anemia, and thrombocytopenia    EKG None  Radiology Ct Head Wo  Contrast  Result Date: 05/24/2018 CLINICAL DATA:  Low platelets. Recent seizure. Headache and confusion. EXAM: CT HEAD WITHOUT CONTRAST TECHNIQUE: Contiguous axial images were obtained from the base of the skull through the vertex without intravenous contrast. COMPARISON:  Multiple prior exams, most recent head CT 2 days ago 05/21/2018 FINDINGS: Brain: Streak artifact from left earring partially obscures regional evaluation. Mild generalized atrophy for age, unchanged. No intracranial hemorrhage, mass effect, or midline shift. No hydrocephalus. The basilar cisterns are patent. No evidence of territorial infarct or acute ischemia. No extra-axial or intracranial fluid collection. Vascular: No hyperdense vessel. Skull: No fracture or focal lesion. Sinuses/Orbits: Paranasal sinuses and mastoid air cells are clear. The visualized orbits are unremarkable. Other: None. IMPRESSION: 1.  No acute intracranial abnormality. 2. Unchanged atrophy. Electronically Signed  By: Keith Rake M.D.   On: 05/24/2018 00:04    Procedures Procedures (including critical care time)  Medications Ordered in ED Medications  potassium chloride SA (K-DUR,KLOR-CON) CR tablet 40 mEq (40 mEq Oral Given 05/24/18 0236)     Initial Impression / Assessment and Plan / ED Course  I have reviewed the triage vital signs and the nursing notes.  Pertinent labs & imaging results that were available during my care of the patient were reviewed by me and considered in my medical decision making (see chart for details).    Patient's laboratory testing was repeated to verify her thrombocytopenia.  However 38,000 she should not be at risk for spontaneous hemorrhage.  But due to her having a headache CT of the head was done tonight.  Patient's ammonia level was higher than her baseline however she was able to give me all of her history.  She does not appear to have any confusion during my exam.  She states she feels like she is having some  difficulty concentrating.  However I feel like she can go back to her shelter with her friend.  She should take an extra dose or 2 of the lactulose for the next couple of days.  Final Clinical Impressions(s) / ED Diagnoses   Final diagnoses:  Alcoholic cirrhosis of liver without ascites ([place])  Hypokalemia    ED Discharge Orders         Ordered    potassium chloride SA (K-DUR,KLOR-CON) 20 MEQ tablet  2 times daily     05/24/18 0349         Plan discharge  Rolland Porter, MD, Barbette Or, MD 05/24/18 [phone]

## 2018-05-24 NOTE — Discharge Instructions (Addendum)
Take the potassium pills until gone.  Take an extra dose or two of your lactulose for the next couple of days in addition to the standard that you are taking when you eat.  Your platelet count is 37,000 which is higher than it was a few weeks ago.  Keep your appointments with your doctors.  Return to the emergency department if your symptoms get worse.

## 2018-05-24 NOTE — Telephone Encounter (Signed)
Patient diagnosed with Cirrhosis of Liver due to Alcoholism and Hepatitis C. Her last platelet infusion was on 02/10/2018 when her platelet count was at 5.   Spoke with patient concerning Thrombocytopenia today. Platelet count  was decreased at 38 on 05/20/2018. On 05/23/2018 patient had c/o epistaxis and she noticed increased bruising, in which I instructed her to report to ED. Platelets stable, but mildly decreased to 37. She reports easy bruising. She denies petechiae, gingival bleeding, epistaxis, profuse bleeding from superficial cuts, menorrhagia, mentrorrhagia, hematochezia, and hematuria. She is not in any distress at this time. She will follow up with or office next week to re-evaluate Platelet count. Patient verbalized understanding.   Kathe Becton,  MSN, FNP-C [facility] [address] [place], [place] [address] [phone]

## 2018-05-25 LAB — PATHOLOGIST SMEAR REVIEW

## 2018-05-28 ENCOUNTER — Other Ambulatory Visit: Payer: Self-pay

## 2018-05-29 ENCOUNTER — Other Ambulatory Visit: Payer: Self-pay

## 2018-05-29 DIAGNOSIS — D696 Thrombocytopenia, unspecified: Secondary | ICD-10-CM

## 2018-05-30 LAB — CBC WITH DIFFERENTIAL/PLATELET
Basophils Absolute: 0 10*3/uL (ref 0.0–0.2)
Basos: 1 %
EOS (ABSOLUTE): 0.1 10*3/uL (ref 0.0–0.4)
Eos: 6 %
Hematocrit: 36.9 % (ref 34.0–46.6)
Hemoglobin: 12.1 g/dL (ref 11.1–15.9)
Immature Grans (Abs): 0 10*3/uL (ref 0.0–0.1)
Immature Granulocytes: 0 %
Lymphocytes Absolute: 0.7 10*3/uL (ref 0.7–3.1)
Lymphs: 32 %
MCH: 27.8 pg (ref 26.6–33.0)
MCHC: 32.8 g/dL (ref 31.5–35.7)
MCV: 85 fL (ref 79–97)
Monocytes Absolute: 0.3 10*3/uL (ref 0.1–0.9)
Monocytes: 15 %
Neutrophils Absolute: 1.1 10*3/uL — ABNORMAL LOW (ref 1.4–7.0)
Neutrophils: 46 %
Platelets: 38 10*3/uL — CL (ref 150–450)
RBC: 4.35 x10E6/uL (ref 3.77–5.28)
RDW: 17.4 % — ABNORMAL HIGH (ref 12.3–15.4)
WBC: 2.2 10*3/uL — CL (ref 3.4–10.8)

## 2018-06-01 ENCOUNTER — Emergency Department (HOSPITAL_COMMUNITY)
Admission: EM | Admit: 2018-06-01 | Discharge: 2018-06-01 | Disposition: A | Discharge status: Home / Self Care | Payer: Medicaid Other | Attending: Emergency Medicine | Admitting: Emergency Medicine

## 2018-06-01 ENCOUNTER — Encounter (HOSPITAL_COMMUNITY): Payer: Self-pay | Admitting: *Deleted

## 2018-06-01 ENCOUNTER — Emergency Department (HOSPITAL_COMMUNITY): Payer: Medicaid Other

## 2018-06-01 ENCOUNTER — Other Ambulatory Visit: Payer: Self-pay

## 2018-06-01 DIAGNOSIS — R42 Dizziness and giddiness: Secondary | ICD-10-CM | POA: Diagnosis present

## 2018-06-01 DIAGNOSIS — F1019 Alcohol abuse with unspecified alcohol-induced disorder: Secondary | ICD-10-CM | POA: Insufficient documentation

## 2018-06-01 DIAGNOSIS — D696 Thrombocytopenia, unspecified: Secondary | ICD-10-CM | POA: Diagnosis not present

## 2018-06-01 DIAGNOSIS — D691 Qualitative platelet defects: Secondary | ICD-10-CM

## 2018-06-01 DIAGNOSIS — W19XXXA Unspecified fall, initial encounter: Secondary | ICD-10-CM

## 2018-06-01 DIAGNOSIS — Z79899 Other long term (current) drug therapy: Secondary | ICD-10-CM | POA: Insufficient documentation

## 2018-06-01 DIAGNOSIS — F1010 Alcohol abuse, uncomplicated: Secondary | ICD-10-CM

## 2018-06-01 DIAGNOSIS — F10920 Alcohol use, unspecified with intoxication, uncomplicated: Secondary | ICD-10-CM

## 2018-06-01 LAB — HEPATIC FUNCTION PANEL
ALT: 250 U/L — ABNORMAL HIGH (ref 0–44)
AST: 421 U/L — ABNORMAL HIGH (ref 15–41)
Albumin: 3.2 g/dL — ABNORMAL LOW (ref 3.5–5.0)
Alkaline Phosphatase: 193 U/L — ABNORMAL HIGH (ref 38–126)
Bilirubin, Direct: 0.5 mg/dL — ABNORMAL HIGH (ref 0.0–0.2)
Indirect Bilirubin: 0.7 mg/dL (ref 0.3–0.9)
Total Bilirubin: 1.2 mg/dL (ref 0.3–1.2)
Total Protein: 7.9 g/dL (ref 6.5–8.1)

## 2018-06-01 LAB — BASIC METABOLIC PANEL
Anion gap: 13 (ref 5–15)
BUN: 5 mg/dL — ABNORMAL LOW (ref 6–20)
CO2: 23 mmol/L (ref 22–32)
Calcium: 8.1 mg/dL — ABNORMAL LOW (ref 8.9–10.3)
Chloride: 107 mmol/L (ref 98–111)
Creatinine, Ser: 0.72 mg/dL (ref 0.44–1.00)
GFR calc Af Amer: >60 mL/min (ref >60)
GFR calc non Af Amer: >60 mL/min (ref >60)
Glucose, Bld: 96 mg/dL (ref 70–99)
Potassium: 3.4 mmol/L — ABNORMAL LOW (ref 3.5–5.1)
Sodium: 143 mmol/L (ref 135–145)

## 2018-06-01 LAB — CBC WITH DIFFERENTIAL/PLATELET
Abs Immature Granulocytes: 0 10*3/uL (ref 0.0–0.1)
Basophils Absolute: 0 10*3/uL (ref 0.0–0.1)
Basophils Relative: 1 %
Eosinophils Absolute: 0.1 10*3/uL (ref 0.0–0.7)
Eosinophils Relative: 5 %
HCT: 40.1 % (ref 36.0–46.0)
Hemoglobin: 12.7 g/dL (ref 12.0–15.0)
Immature Granulocytes: 0 %
Lymphocytes Relative: 50 %
Lymphs Abs: 1.3 10*3/uL (ref 0.7–4.0)
MCH: 27.7 pg (ref 26.0–34.0)
MCHC: 31.7 g/dL (ref 30.0–36.0)
MCV: 87.4 fL (ref 78.0–100.0)
Monocytes Absolute: 0.3 10*3/uL (ref 0.1–1.0)
Monocytes Relative: 10 %
Neutro Abs: 0.9 10*3/uL — ABNORMAL LOW (ref 1.7–7.7)
Neutrophils Relative %: 34 %
Platelets: 34 10*3/uL — ABNORMAL LOW (ref 150–400)
RBC: 4.59 MIL/uL (ref 3.87–5.11)
RDW: 20.2 % — ABNORMAL HIGH (ref 11.5–15.5)
WBC: 2.7 10*3/uL — ABNORMAL LOW (ref 4.0–10.5)

## 2018-06-01 LAB — PROTIME-INR
INR: 1.23
Prothrombin Time: 15.4 seconds — ABNORMAL HIGH (ref 11.4–15.2)

## 2018-06-01 LAB — URINALYSIS, ROUTINE W REFLEX MICROSCOPIC
Bilirubin Urine: NEGATIVE
Glucose, UA: NEGATIVE mg/dL
Hgb urine dipstick: NEGATIVE
Ketones, ur: NEGATIVE mg/dL
Leukocytes, UA: NEGATIVE
Nitrite: NEGATIVE
Protein, ur: NEGATIVE mg/dL
Specific Gravity, Urine: 1.004 — ABNORMAL LOW (ref 1.005–1.030)
pH: 7 (ref 5.0–8.0)

## 2018-06-01 LAB — RAPID URINE DRUG SCREEN, HOSP PERFORMED
Amphetamines: NOT DETECTED
Barbiturates: NOT DETECTED
Benzodiazepines: NOT DETECTED
Cocaine: NOT DETECTED
Opiates: NOT DETECTED
Tetrahydrocannabinol: NOT DETECTED

## 2018-06-01 LAB — TROPONIN I: Troponin I: <0.03 ng/mL (ref <0.03)

## 2018-06-01 LAB — I-STAT BETA HCG BLOOD, ED (MC, WL, AP ONLY): I-stat hCG, quantitative: <5 m[IU]/mL (ref <5)

## 2018-06-01 LAB — ETHANOL: Alcohol, Ethyl (B): 304 mg/dL (ref <10)

## 2018-06-01 MED ORDER — CHLORDIAZEPOXIDE HCL 25 MG PO CAPS: ORAL_CAPSULE | ORAL | 0 refills | Status: DC

## 2018-06-01 MED ORDER — CHLORDIAZEPOXIDE HCL 25 MG PO CAPS
50.0000 mg | ORAL_CAPSULE | Freq: Once | ORAL | Status: AC
  Administered 2018-06-01: 50 mg via ORAL
  Filled 2018-06-01: qty 2

## 2018-06-01 NOTE — Progress Notes (Addendum)
CSW met with pt at pt's bedside. Pt informed CSW that she is staying at [facility]. [facility] aware pt is here. CSW discussed pt's alcohol level upon arrival. Pt denied alcohol use for today, stating was drinking yesterday. CSW and pt discussed AA meetings. Pt stated that there is a meeting down the road from [facility]. CSW provided pt with printed AA meeting schedule for [facility]. CSW and pt discussed health concerns pertaining to her drinking. Pt agreeable to attend AA meeting tomorrow. Pt concerned about transportation back to [facility]. Pt stated that her boyfriend left yesterday with all of her medications in her backpack. Pt requesting her medications. CSW explained that certain medications cannot be prescribed from the ED, but CSW will update medical team. Pt agreeable to going to [facility] or PCP to follow up on her medications.   Wendelyn Breslow, [facility]  [phone]

## 2018-06-01 NOTE — ED Notes (Signed)
Patient transported to CT 

## 2018-06-01 NOTE — ED Provider Notes (Addendum)
[facility] EMERGENCY DEPARTMENT Provider Note   CSN: [id number] Arrival date & time: 06/01/18  1325     History   Chief Complaint Chief Complaint  Patient presents with  . Fall    HPI Briana Sweeney is a 47 y.o. female possible history of anxiety, bipolar, cirrhosis, depression, GERD, hep C who presents for evaluation via EMS for fall.  Patient reports that she was walking when she had a mechanical fall, causing her to land on her right knee.  She reports did not hit her head or lose consciousness.  She does report feeling slightly dizzy just prior to onset of fall.  No chest pain.  No difficulty breathing.  Patient states that she "is concerned she is going to die because her primary care doctor told her to go the emergency department because of her low platelet."  She states that when she fell, she landed on her right knee.  She states that she is bleeding from a small laceration/abrasion to the anterior aspect of the left knee but denies any other bleeding issues.  Denies hitting her head or losing consciousness.  Patient states that she was drinking alcohol earlier this morning prior to onset of fall.  She reports that she drank 2 beers but cannot tell me how big they were.  Denies any cocaine, heroin, marijuana use.  At this time, patient is not having any complaints.  She denies any vision changes, chest pain, difficulty breathing, numbness/weakness of arms or legs, vomiting.  The history is provided by the patient.    Past Medical History:  Diagnosis Date  . Anxiety   . Bipolar affective disorder ([place])    With anxiety features  . Cirrhosis of liver ([place])    Due to alcohol and hepatitis C  . Depression   . GERD (gastroesophageal reflux disease)   . Hematemesis 02/10/2018  . Hepatitis C 2018   hepatitis c and alcohol related hepatitis  . History of blood transfusion    "blood doesn't clot; I fell down and had to have a transfusion"  . History of kidney stones    . Migraine    "when I get really stressed" (09/01/2017)  . Schizophrenia ([place])   . Seizures ([place])    "when I run out of my RX; lots recently" (09/01/2017)    Patient Active Problem List   Diagnosis Date Noted  . Upper GI bleed 02/10/2018  . Alcohol withdrawal, with unspecified complication ([place]) 50/53/9767  . Chronic anemia   . Thrombocytopenia due to drugs   . Suicidal ideation   . Thrombocytopenia ([place]) 01/02/2018  . GERD (gastroesophageal reflux disease) 11/01/2017  . Trichimoniasis 10/30/2017  . Alcohol dependence ([place]) 10/29/2017  . MDD (major depressive disorder), severe ([place]) 10/28/2017  . Acute hyperactive alcohol withdrawal delirium ([place]) 10/09/2017  . Schizophrenia ([place]) 10/09/2017  . Alcohol abuse with alcohol-induced mood disorder ([place])   . Alcohol withdrawal syndrome with complication ([place]) 34/19/3790  . Esophageal varices without bleeding ([place])   . Hematemesis 09/01/2017  . Ascites due to alcoholic cirrhosis ([place])   . Decompensated hepatic cirrhosis ([place]) 07/23/2017  . Alcohol abuse 07/23/2017  . Hepatitis C antibody positive in blood 07/23/2017  . Hypokalemia 07/23/2017  . Jaundice 07/23/2017  . Coagulopathy ([place]) 07/23/2017  . Hypomagnesemia 07/23/2017  . Pancytopenia ([place]) 07/23/2017  . Alcoholic cirrhosis of liver with ascites ([place]) 07/23/2017    Past Surgical History:  Procedure Laterality Date  . ESOPHAGOGASTRODUODENOSCOPY N/A 09/03/2017   Procedure: ESOPHAGOGASTRODUODENOSCOPY (EGD);  Surgeon: Doran Stabler, MD;  Location: [facility];  Service: Gastroenterology;  Laterality: N/A;  . FINGER FRACTURE SURGERY Left    "shattered my pinky"  . FRACTURE SURGERY    . IR PARACENTESIS  07/23/2017  . IR PARACENTESIS  07/2017   "did it twice in the same week" (09/01/2017)  . SHOULDER OPEN ROTATOR CUFF REPAIR Right   . TUBAL LIGATION    . VAGINAL HYSTERECTOMY       OB History   None      Home Medications    Prior to Admission medications     Medication Sig Start Date End Date Taking? Authorizing Provider  busPIRone (BUSPAR) 10 MG tablet Take 1 tablet (10 mg total) by mouth 2 (two) times daily. 04/01/18   [name], FNP  FLUoxetine (PROZAC) 20 MG capsule Take 1 capsule (20 mg total) by mouth daily. For mood control 04/06/18   [name], FNP  folic acid (FOLVITE) 1 MG tablet Take 1 tablet (1 mg total) by mouth daily. 02/15/18   Phillips Grout, MD  furosemide (LASIX) 20 MG tablet Take 1 tablet (20 mg total) by mouth daily. 04/22/18   [name], FNP  gabapentin (NEURONTIN) 100 MG capsule Take 2 capsules (200 mg total) by mouth 3 (three) times daily. 05/20/18   [name], FNP  haloperidol (HALDOL) 2 MG tablet Take 1 tablet (2 mg total) by mouth at bedtime. 02/20/18   [name], FNP  hydrOXYzine (ATARAX/VISTARIL) 25 MG tablet Take 1 tablet (25 mg total) by mouth 3 (three) times daily as needed for anxiety. 05/20/18   [name], FNP  lactulose (CHRONULAC) 10 GM/15ML solution Take 30 mLs (20 g total) by mouth daily. 05/20/18   [name], FNP  Multiple Vitamin (MULTIVITAMIN WITH MINERALS) TABS tablet Take 1 tablet by mouth daily. 01/09/18   Eugenie Filler, MD  pantoprazole (PROTONIX) 40 MG tablet Take 1 tablet (40 mg total) by mouth daily with breakfast. 02/15/18   Phillips Grout, MD  phenytoin (DILANTIN) 100 MG ER capsule Take 300 mg by mouth at bedtime.    [provider]  potassium chloride SA (K-DUR,KLOR-CON) 20 MEQ tablet Take 1 tablet (20 mEq total) by mouth 2 (two) times daily. 05/24/18   Rolland Porter, MD  spironolactone (ALDACTONE) 50 MG tablet Take 1 tablet (50 mg total) by mouth daily. 03/08/18   [name], FNP  topiramate (TOPAMAX) 50 MG tablet Take 1 tablet (50 mg total) by mouth daily. 03/08/18   [name], FNP  traZODone (DESYREL) 50 MG tablet Take 1 tablet (50 mg total) by mouth at bedtime as needed for sleep. 04/22/18   [name], FNP    Family  History Family History  Problem Relation Age of Onset  . Lung cancer Mother 16  . Alcohol abuse Mother   . Throat cancer Father 10    Social History Social History   Tobacco Use  . Smoking status: Never Smoker  . Smokeless tobacco: Never Used  Substance Use Topics  . Alcohol use: Yes    Alcohol/week: 63.0 standard drinks    Types: 63 Cans of beer per week    Comment: weekly "I have cut back"  . Drug use: No     Allergies   Patient has no known allergies.   Review of Systems Review of Systems  Constitutional: Negative for fever.  Eyes: Negative for visual disturbance.  Respiratory: Negative for cough  and shortness of breath.   Cardiovascular: Negative for chest pain.  Gastrointestinal: Negative for abdominal pain, nausea and vomiting.  Genitourinary: Negative for dysuria and hematuria.  Skin: Positive for wound.  Neurological: Positive for dizziness. Negative for weakness, numbness and headaches.  All other systems reviewed and are negative.    Physical Exam Updated Vital Signs BP 127/81   Pulse 83   Temp 98.4 F (36.9 C) (Oral)   Resp 19   Ht '4\' 11"'$  (1.499 m)   Wt 59 kg   SpO2 99%   BMI 26.26 kg/m   Physical Exam  Constitutional: She appears well-developed and well-nourished.  Appears intoxicated.  HENT:  Head: Normocephalic and atraumatic.  Mouth/Throat: Oropharynx is clear and moist and mucous membranes are normal.  Eyes: Pupils are equal, round, and reactive to light. Conjunctivae, EOM and lids are normal.  Neck: Full passive range of motion without pain.  Cardiovascular: Normal rate, regular rhythm, normal heart sounds and normal pulses. Exam reveals no gallop and no friction rub.  No murmur heard. Pulses:      Radial pulses are 2+ on the right side, and 2+ on the left side.       Dorsalis pedis pulses are 2+ on the right side, and 2+ on the left side.  Pulmonary/Chest: Effort normal and breath sounds normal.  Lungs clear to auscultation  bilaterally.  Symmetric chest rise.  No wheezing, rales, rhonchi.  Abdominal: Soft. Normal appearance. There is no tenderness. There is no rigidity and no guarding.  Abdomen is soft, non-distended, non-tender. No rigidity, No guarding. No peritoneal signs.  Musculoskeletal: Normal range of motion.  Neurological: She is alert.  Alert and oriented x 2. When asked what year, she replies 1999 5/5 strength BUE and BLE Normal finger to nose  MAE Follows commands   Skin: Skin is warm and dry. Capillary refill takes less than 2 seconds.  Abrasion noted to anterior aspect of right knee.  No evidence of deep laceration.  Dried blood over topping wound.  Psychiatric: She has a normal mood and affect. Her speech is normal.  Nursing note and vitals reviewed.    ED Treatments / Results  Labs (all labs ordered are listed, but only abnormal results are displayed) Labs Reviewed  URINALYSIS, ROUTINE W REFLEX MICROSCOPIC - Abnormal; Notable for the following components:      Result Value   Specific Gravity, Urine 1.004 (*)    All other components within normal limits  RAPID URINE DRUG SCREEN, HOSP PERFORMED  BASIC METABOLIC PANEL  CBC WITH DIFFERENTIAL/PLATELET  ETHANOL  I-STAT BETA HCG BLOOD, ED ([facility], [facility], [facility] ONLY)    EKG None  Radiology Ct Head Wo Contrast  Result Date: 06/01/2018 CLINICAL DATA:  Fall, dizziness. EXAM: CT HEAD WITHOUT CONTRAST TECHNIQUE: Contiguous axial images were obtained from the base of the skull through the vertex without intravenous contrast. COMPARISON:  Head CT dated 05/23/2018. FINDINGS: Brain: Again noted is generalized parenchymal atrophy with commensurate dilatation of the ventricles and sulci. Ventricles are stable in size and configuration. There is no mass, hemorrhage, edema or other evidence of acute parenchymal abnormality. No extra-axial hemorrhage. Vascular: Chronic calcified atherosclerotic changes of the large vessels at the skull base. No unexpected  hyperdense vessel. Skull: Normal. Negative for fracture or focal lesion. Sinuses/Orbits: No acute finding. Other: None. IMPRESSION: No acute findings.  No intracranial mass, hemorrhage or edema. Electronically Signed   By: Franki Cabot M.D.   On: 06/01/2018 15:32   Dg Knee  Complete 4 Views Right  Result Date: 06/01/2018 CLINICAL DATA:  Seizure today, fall with RIGHT knee injury. EXAM: RIGHT KNEE - COMPLETE 4+ VIEW COMPARISON:  None. FINDINGS: No evidence of fracture, dislocation, or joint effusion. No evidence of arthropathy or other focal bone abnormality. Soft tissues are unremarkable. IMPRESSION: Negative. Electronically Signed   By: Franki Cabot M.D.   On: 06/01/2018 14:49    Procedures Procedures (including critical care time)  Medications Ordered in ED Medications - No data to display   Initial Impression / Assessment and Plan / ED Course  I have reviewed the triage vital signs and the nursing notes.  Pertinent labs & imaging results that were available during my care of the patient were reviewed by me and considered in my medical decision making (see chart for details).     47 year old female with past medical history of cirrhosis, hepatitis, from cytopenia, alcohol intoxication presents for evaluation of fall.  Patient does endorse drinking prior to onset of fall.  Sustained a small abrasion of the anterior aspect of left knee.  Was told she had low platelets and was told by her primary care doctor to come to the ED last week but never did.  No bleeding issues.  Patient did have some dizziness preceding her fall.  No chest pain, numbness/weakness.  She does appear intoxicated.  Vital signs stable.  We will plan for CT head, x-ray of knee, basic labs.  CT head negative for any acute findings.  Knee x-ray negative for any acute abnormality.  I Subedi negative.  Urine drug screen negative.  UA negative for any acute infectious etiology.  Ethanol level is 304.  CBC shows leukopenia of  2.7 and thrombus cytopenia of 34.  Review of records show that this is not a new issue.  She had previously been thrombus cytopenic with platelets in the 20s.  She had been evaluated by Dr. Marin Olp of heme-onc and had been scheduled for bone marrow biopsy.  Patient's potassium 3.4.  Otherwise unremarkable.  Discussed patient with Dr. Rogue Jury (Heme/Onc).  Given that patient's thrombocytopenia is chronic and she is not having any acute bleeding, no further intervention required.  She is discharged from the hospital, she can follow-up with heme-onc on an outpatient basis.  Patient signed out to Leta Speller, MD with LFTs pending patient to sober up.  On upon patient becoming more sober, if lab work is unremarkable, can be discharged home with outpatient follow-up with heme-onc.  Final Clinical Impressions(s) / ED Diagnoses   Final diagnoses:  Alcoholic intoxication without complication ([place])  Thrombocytopathia ([place])  Fall, initial encounter  Alcohol abuse    ED Discharge Orders    None       Volanda Napoleon, PA-C 06/01/18 2240    Volanda Napoleon, PA-C 06/01/18 2242    Valarie Merino, MD 06/02/18 1007

## 2018-06-01 NOTE — ED Provider Notes (Signed)
  Patient care assumed from [facility], [place] at [phone].  Please see prior providers note for full history and physical exam.  Chief complaint: Fall  Plan: Patient status post low height fall, negative work-up.  She did have low platelets.  Hemoccult.  Patient did have chronic low platelets.  Heme-onc okay with patient discharge to follow-up in clinic.  Patient without current active signs of bleeding.  Patient noted to have significant EtOH on board.  Patient to metabolize to freedom.  06/01/18 2000: Patient reevaluated.  Patient currently believes is 1999, unsure of her location.  Patient is not a reason why she is here.  Patient continues to require metabolization.  Will reassess.  07/01/18 space 2230: Patient reevaluated.  Patient alert and oriented, GCS 15.  Patient able to ablate without difficulty.  Patient endorses mild tremors and mild gradually worsening headache.  I believe patient is starting to go into alcohol withdrawals.  Patient states desire to quit.  Patient states she has been recently taking Librium and she is out.  Patient given dose of Librium and prescription.  Advised follow with PCP.  Patient stable for discharge.    Keenan Bachelor, MD 06/01/18 2230    Dorie Rank, MD 06/01/18 [phone]

## 2018-06-01 NOTE — Discharge Instructions (Addendum)
Platelet count was low today.  Please follow-up with the cancer center for further evaluation.  Follow-up with your primary care doctor as needed.  Return to emergency department for any abdominal pain, vomiting, bleeding that will not stop or any other worsening or concerning symptoms.

## 2018-06-01 NOTE — ED Triage Notes (Signed)
Pt in via [facility], per report fire dept witnessed pt had a fall going to one knee and then falling to her side, initially pt was alert to self only, pt A&O x4 upon arrival to ED, # 20 L hand, pt reports feeling dizzy prior to event, denies drug use, reports having a beer at 8am, MAE, denies neck and back pain, CBG 94

## 2018-06-03 ENCOUNTER — Other Ambulatory Visit: Payer: Self-pay

## 2018-06-03 DIAGNOSIS — R609 Edema, unspecified: Secondary | ICD-10-CM

## 2018-06-03 DIAGNOSIS — F419 Anxiety disorder, unspecified: Secondary | ICD-10-CM

## 2018-06-03 DIAGNOSIS — F322 Major depressive disorder, single episode, severe without psychotic features: Secondary | ICD-10-CM

## 2018-06-03 MED FILL — HALOPERIDOL 2 MG TABLET: 2 | 30 days supply | Qty: 30 | Fill #2

## 2018-06-03 MED FILL — FLUoxetine HCL 20 MG CAPS: 20 | 30 days supply | Qty: 30 | Fill #1

## 2018-06-03 MED FILL — ?FUROSEMIDE 20 MG TABLET: 20 | 30 days supply | Qty: 30 | Fill #1

## 2018-06-03 MED FILL — traZODone HCL 100 MG TABS: 100 | 30 days supply | Qty: 30 | Fill #2

## 2018-06-03 MED FILL — ?SPIRONOLACTON 50MG TABLET: 50 | 30 days supply | Qty: 30 | Fill #0

## 2018-06-04 ENCOUNTER — Ambulatory Visit: Payer: Self-pay | Admitting: Family Medicine

## 2018-06-04 MED ORDER — BUSPIRONE HCL 10 MG PO TABS: 10.0000 mg | ORAL_TABLET | Freq: Two times a day (BID) | ORAL | 2 refills | Status: DC

## 2018-06-04 MED ORDER — SPIRONOLACTONE 50 MG PO TABS: 50.0000 mg | ORAL_TABLET | Freq: Every day | ORAL | 2 refills | Status: DC

## 2018-06-04 MED ORDER — HYDROXYZINE HCL 25 MG PO TABS: 25.0000 mg | ORAL_TABLET | Freq: Three times a day (TID) | ORAL | 2 refills | Status: DC | PRN

## 2018-06-04 MED ORDER — FUROSEMIDE 20 MG PO TABS: 20.0000 mg | ORAL_TABLET | Freq: Every day | ORAL | 2 refills | Status: DC

## 2018-06-04 MED ORDER — TRAZODONE HCL 50 MG PO TABS: 50.0000 mg | ORAL_TABLET | Freq: Every evening | ORAL | 0 refills | Status: DC | PRN

## 2018-06-04 MED ORDER — TOPIRAMATE 50 MG PO TABS: 50.0000 mg | ORAL_TABLET | Freq: Every day | ORAL | 2 refills | Status: DC

## 2018-06-04 MED ORDER — FLUOXETINE HCL 20 MG PO CAPS: 20.0000 mg | ORAL_CAPSULE | Freq: Every day | ORAL | 2 refills | Status: DC

## 2018-06-05 ENCOUNTER — Ambulatory Visit: Payer: Self-pay | Admitting: Family Medicine

## 2018-06-05 MED FILL — ?SPIRONOLACTONE 50 MG TABLE: 50 | 30 days supply | Qty: 30 | Fill #0

## 2018-06-05 MED FILL — busPIRone HCL 10 MG TABS: 10 | 15 days supply | Qty: 30 | Fill #0

## 2018-06-05 MED FILL — ?FLUOXETINE HCL 20MG TABLET: 20 | 30 days supply | Qty: 30 | Fill #0

## 2018-06-05 MED FILL — ?FUROSEMIDE 20 MG TABLET: 20 | 30 days supply | Qty: 30 | Fill #0

## 2018-06-05 MED FILL — TOPIRAMATE 50 MG TABLET: 50 | 30 days supply | Qty: 30 | Fill #0

## 2018-06-05 MED FILL — hydrOXYzine HCL 25 MG TABS: 25 | 30 days supply | Qty: 90 | Fill #0

## 2018-06-11 DIAGNOSIS — F1420 Cocaine dependence, uncomplicated: Secondary | ICD-10-CM | POA: Insufficient documentation

## 2018-06-11 HISTORY — DX: Cocaine dependence, uncomplicated: F14.20

## 2018-07-01 ENCOUNTER — Telehealth: Payer: Self-pay

## 2018-07-01 NOTE — Telephone Encounter (Signed)
Gave patient number to [facility] to see if they have seen her as a patient.

## 2018-07-07 ENCOUNTER — Other Ambulatory Visit: Payer: Self-pay

## 2018-07-07 DIAGNOSIS — R252 Cramp and spasm: Secondary | ICD-10-CM

## 2018-07-07 DIAGNOSIS — F419 Anxiety disorder, unspecified: Secondary | ICD-10-CM

## 2018-07-07 DIAGNOSIS — F10239 Alcohol dependence with withdrawal, unspecified: Secondary | ICD-10-CM

## 2018-07-07 DIAGNOSIS — F10939 Alcohol use, unspecified with withdrawal, unspecified: Secondary | ICD-10-CM

## 2018-07-08 ENCOUNTER — Other Ambulatory Visit: Payer: Self-pay

## 2018-07-08 ENCOUNTER — Telehealth: Payer: Self-pay

## 2018-07-08 DIAGNOSIS — R252 Cramp and spasm: Secondary | ICD-10-CM

## 2018-07-08 DIAGNOSIS — F419 Anxiety disorder, unspecified: Secondary | ICD-10-CM

## 2018-07-08 MED ORDER — PROPRANOLOL HCL 20 MG PO TABS: 20.0000 mg | ORAL_TABLET | Freq: Two times a day (BID) | ORAL | 1 refills | Status: DC

## 2018-07-08 MED ORDER — FLUOXETINE HCL 20 MG PO CAPS: 20.0000 mg | ORAL_CAPSULE | Freq: Every day | ORAL | 2 refills | Status: DC

## 2018-07-08 MED ORDER — GABAPENTIN 100 MG PO CAPS: 200.0000 mg | ORAL_CAPSULE | Freq: Three times a day (TID) | ORAL | 3 refills | Status: DC

## 2018-07-08 MED ORDER — TRAZODONE HCL 50 MG PO TABS: 50.0000 mg | ORAL_TABLET | Freq: Every evening | ORAL | 0 refills | Status: DC | PRN

## 2018-07-08 MED FILL — PROPRANOLOL 20 MG TABLET: 20 | 30 days supply | Qty: 60 | Fill #0

## 2018-07-08 MED FILL — hydrOXYzine HCL 25 MG TABS: 25 | 30 days supply | Qty: 90 | Fill #1

## 2018-07-08 MED FILL — busPIRone HCL 10 MG TABS: 10 | 15 days supply | Qty: 30 | Fill #1

## 2018-07-08 MED FILL — ?SPIRONOLACTON 50MG TABLET: 50 | 30 days supply | Qty: 30 | Fill #0

## 2018-07-08 MED FILL — TOPIRAMATE 50 MG TABLET: 50 | 30 days supply | Qty: 30 | Fill #1

## 2018-07-08 MED FILL — ?FUROSEMIDE 20 MG TABLET: 20 | 30 days supply | Qty: 30 | Fill #0

## 2018-07-08 MED FILL — ?TRAZODONE HCL 50MG TABS: 50 | 15 days supply | Qty: 15 | Fill #0

## 2018-07-08 MED FILL — GABAPENTIN 100 MG CAPSULE: 100 | 30 days supply | Qty: 180 | Fill #1

## 2018-07-08 MED FILL — ?FLUOXETINE HCL 20MG TABLET: 20 | 30 days supply | Qty: 30 | Fill #0

## 2018-07-08 NOTE — Telephone Encounter (Signed)
Medication sent.

## 2018-07-17 DIAGNOSIS — F1994 Other psychoactive substance use, unspecified with psychoactive substance-induced mood disorder: Secondary | ICD-10-CM | POA: Insufficient documentation

## 2018-07-19 IMAGING — CR DG CHEST 2V
2 series · 2 of 2 positions shown · non-contrast
Comparison: Radiograph 10/22/2017, chest CT 10/25/2017

CLINICAL DATA: Dyspnea. Shortness of breath with nausea and
vomiting for 1 day.

EXAM:
CHEST - 2 VIEW

[chest lat]
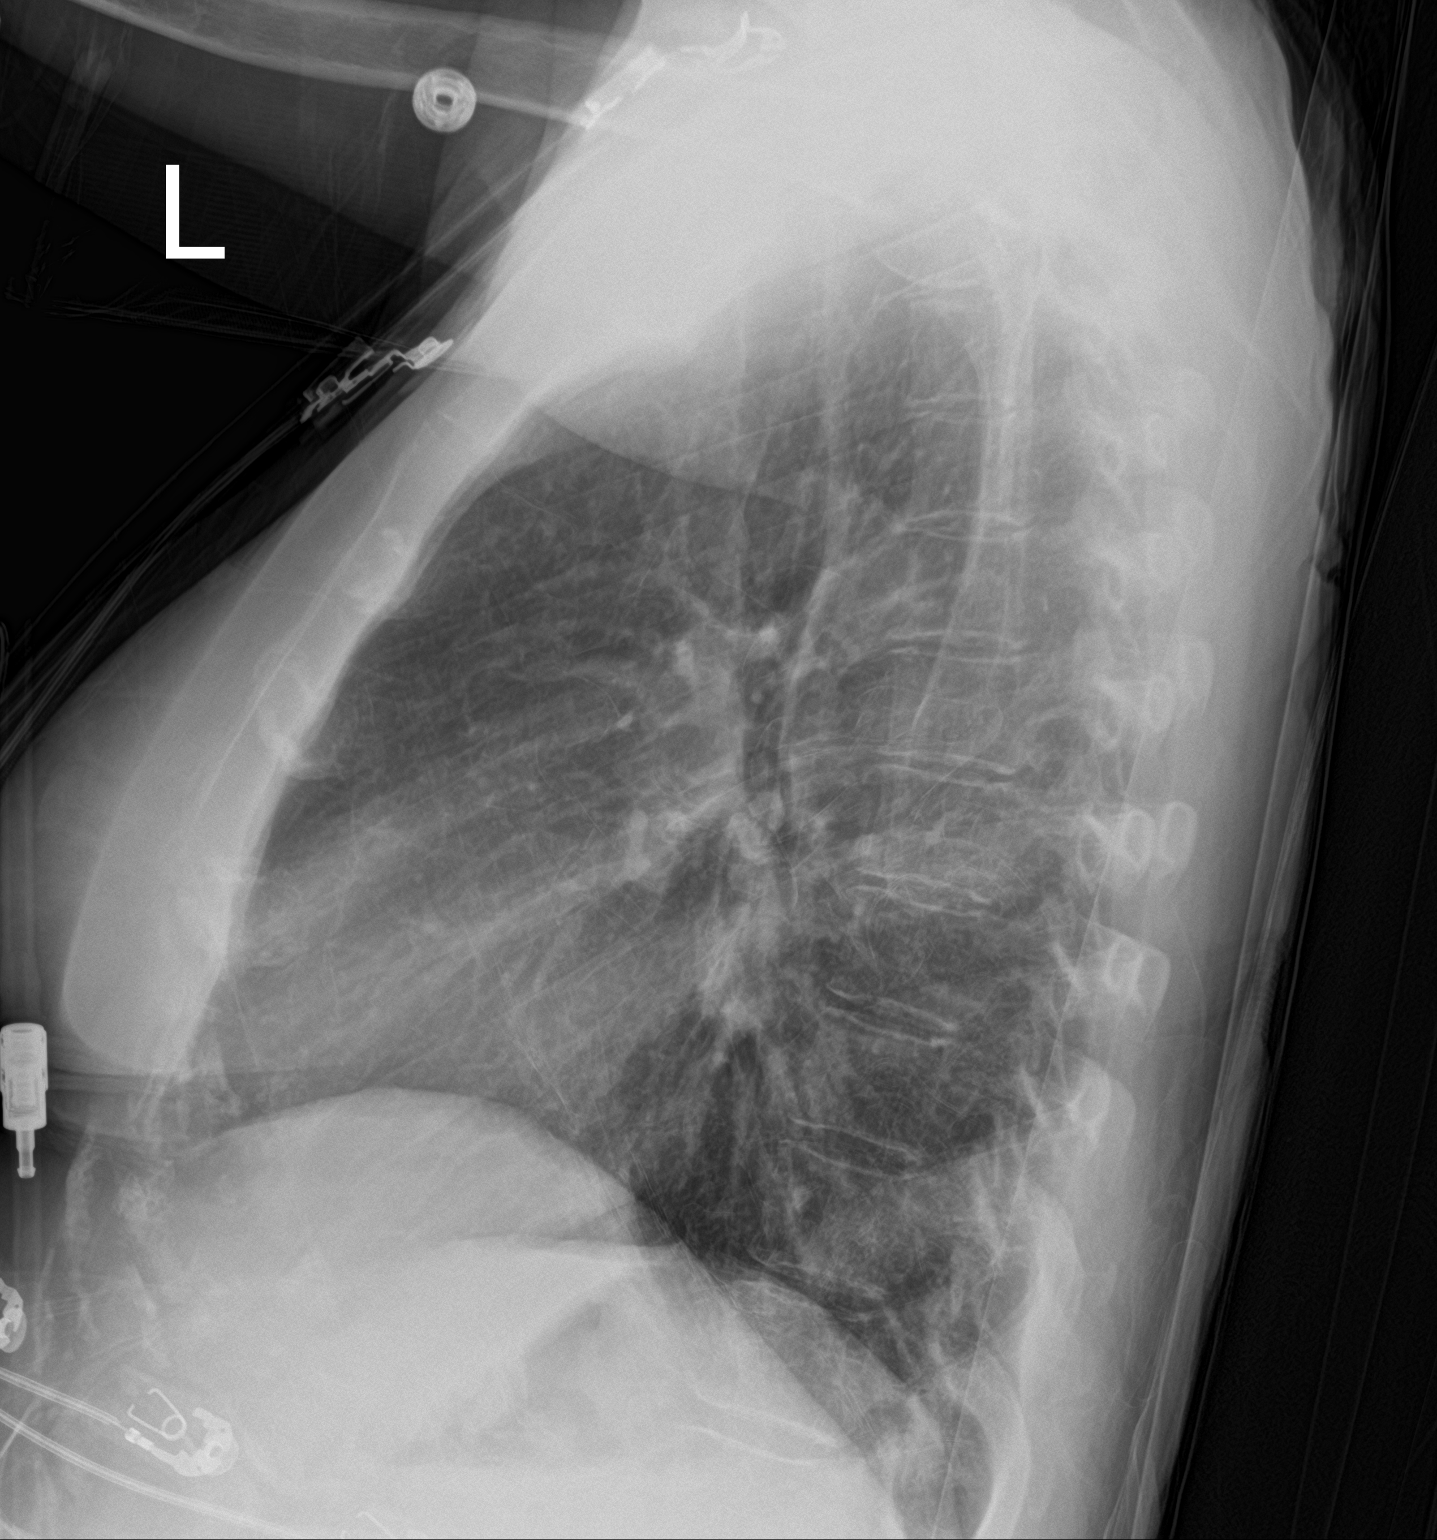

[chest ap]
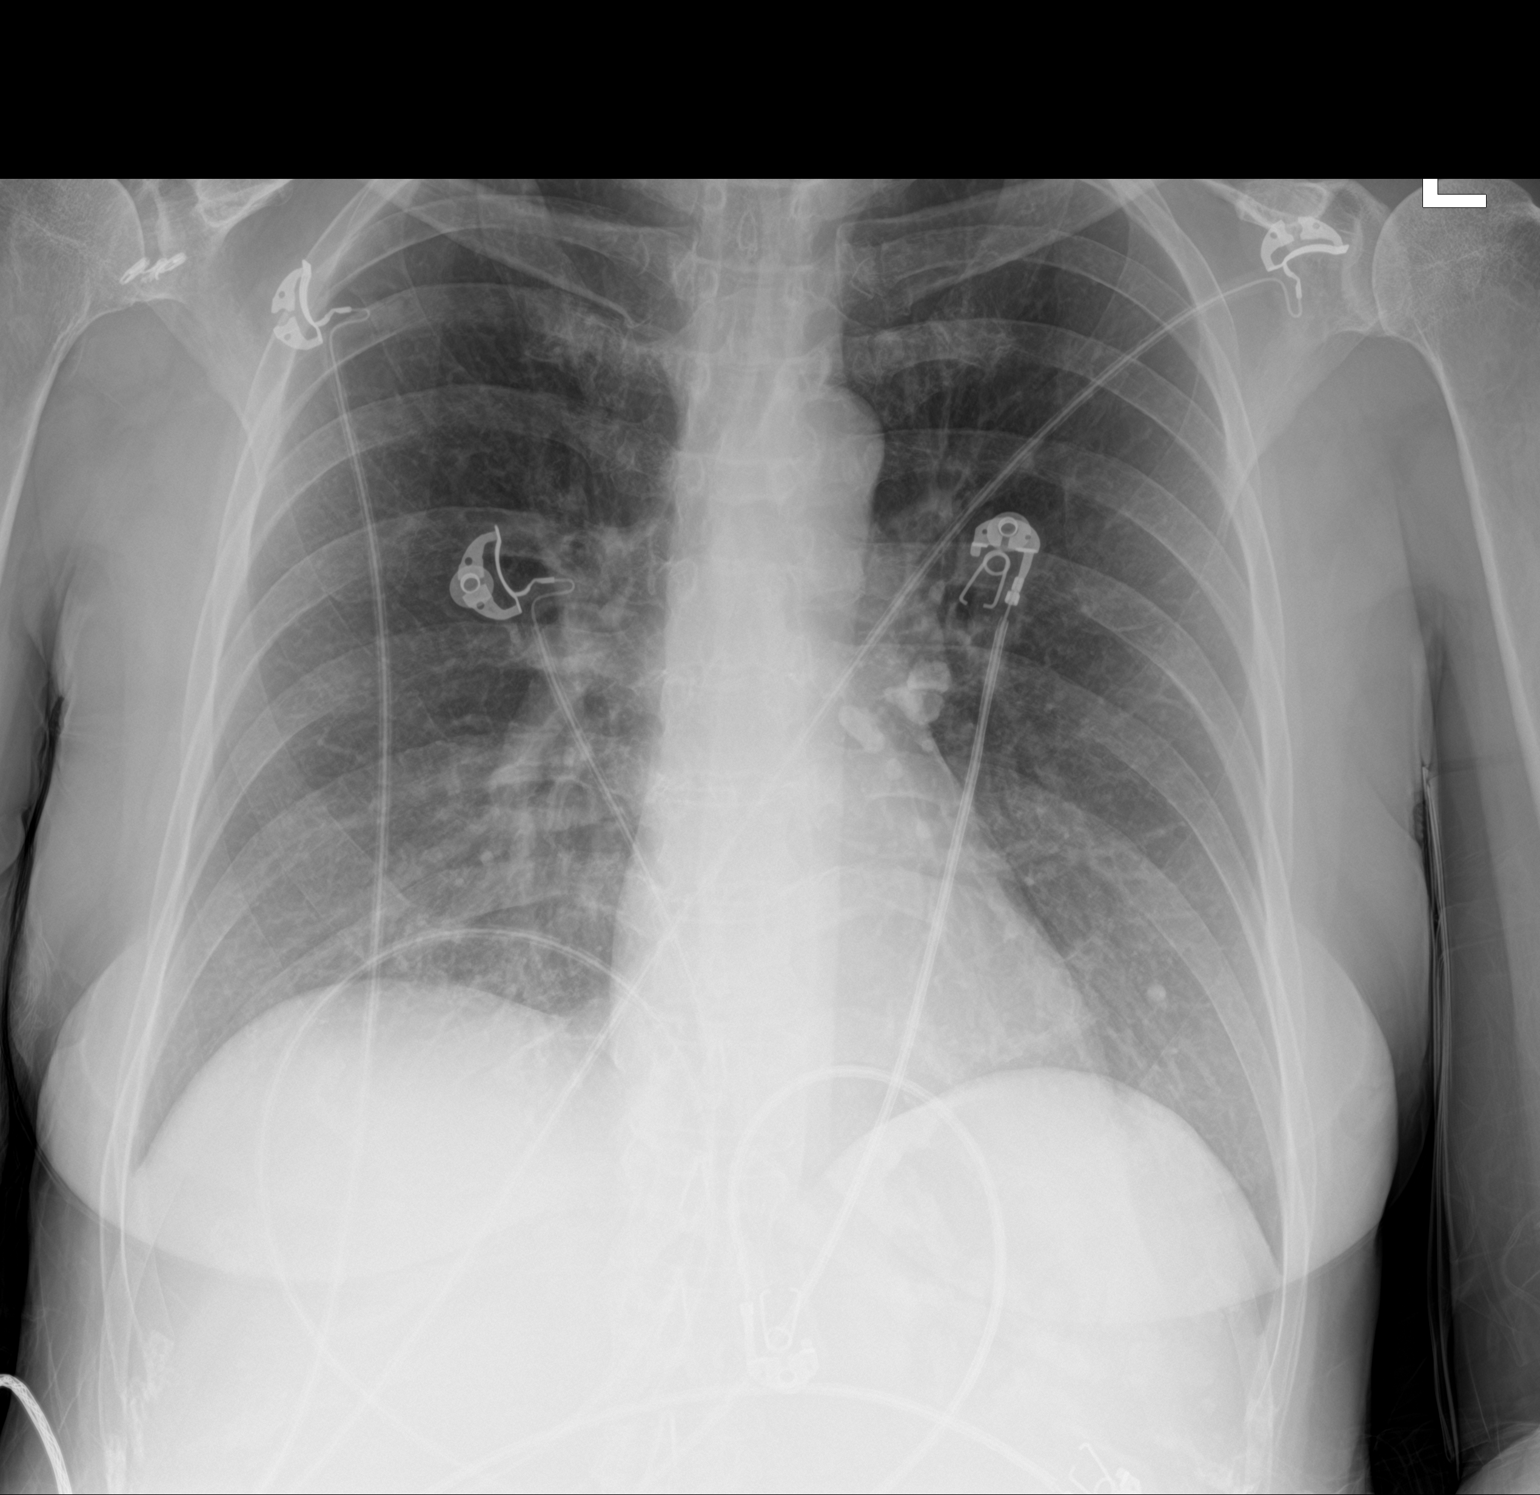

[2 of 2 positions shown; findings below may reference images not displayed]

FINDINGS: The cardiomediastinal contours are normal. Sequela of prior
granulomatous disease with calcified granuloma in the left lower
lobe and calcified left hilar lymph nodes, unchanged.. Pulmonary
vasculature is normal. No consolidation, pleural effusion, or
pneumothorax. No acute osseous abnormalities are seen.
IMPRESSION: 1. No acute findings.
2. Prior granulomatous disease.

## 2018-07-19 IMAGING — CT CT HEAD W/O CM
3 series · 16 of 47 positions shown, 19 images · non-contrast
Comparison: 01/13/2018

CLINICAL DATA: Multiple falls since yesterday.

EXAM:
CT HEAD WITHOUT CONTRAST
TECHNIQUE: Contiguous axial images were obtained from the base of the skull
through the vertex without intravenous contrast.

[Series 3: head 5.0 h30s · axial · 0.43mm/px · z∈[-118,+7]mm · 10 of 31 slices shown, 13 images]
[im 3/31  brain]
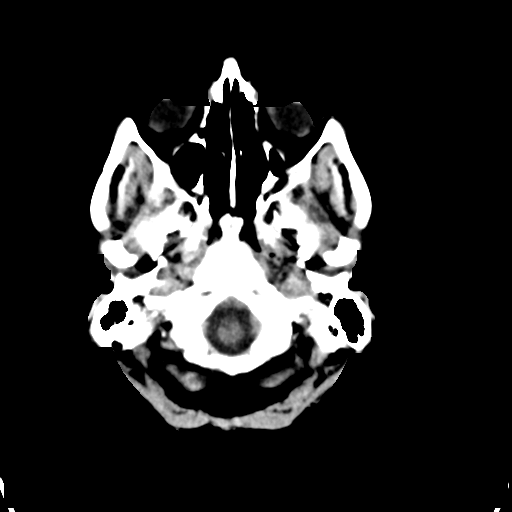
[im 3/31  bone]
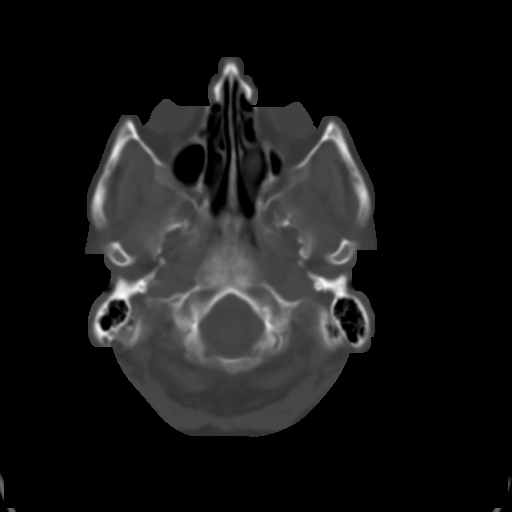
[im 6/31  brain]
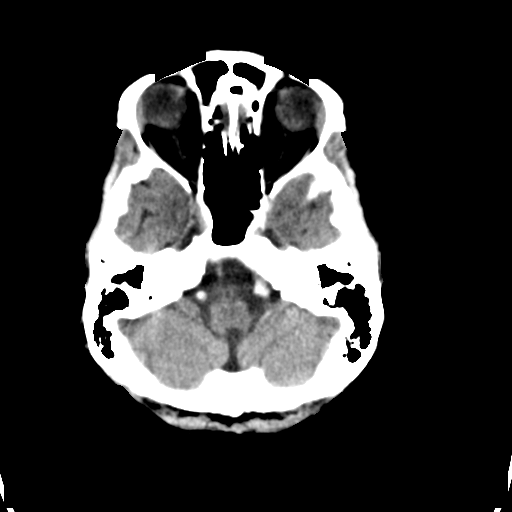
[im 9/31  brain]
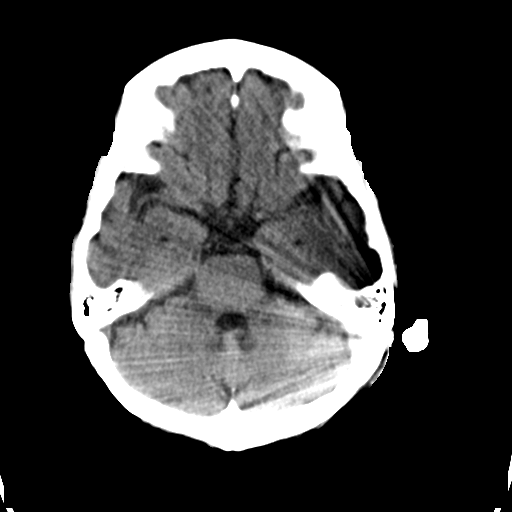
[im 11/31  brain]
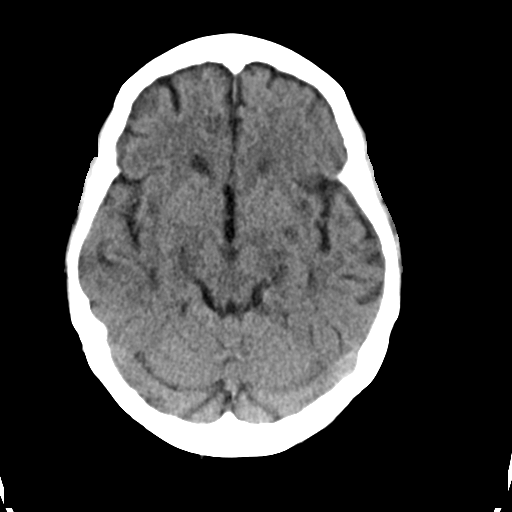
[im 14/31  brain]
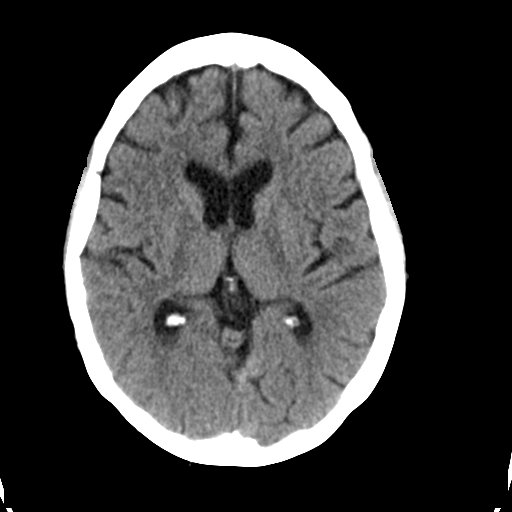
[im 14/31  bone]
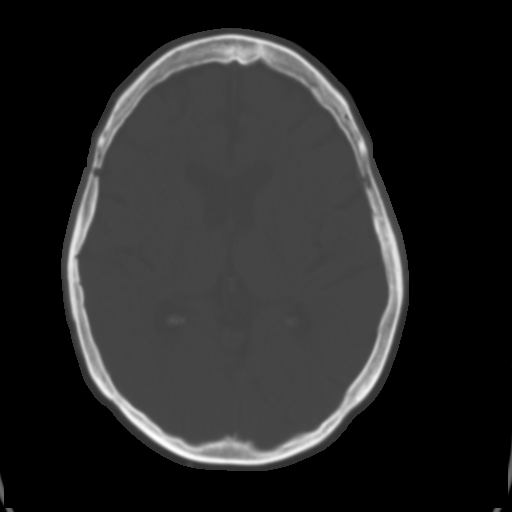
[im 17/31  brain]
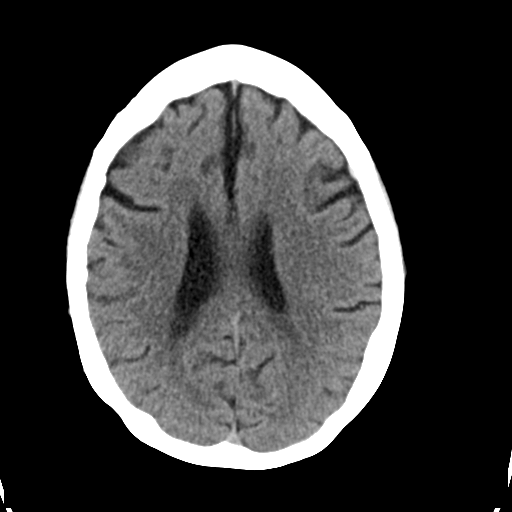
[im 20/31  brain]
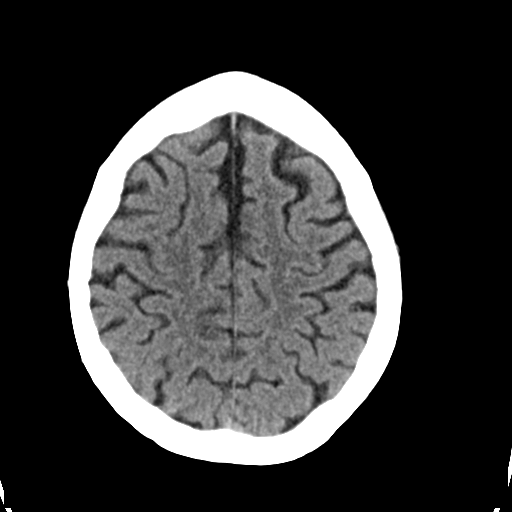
[im 23/31  brain]
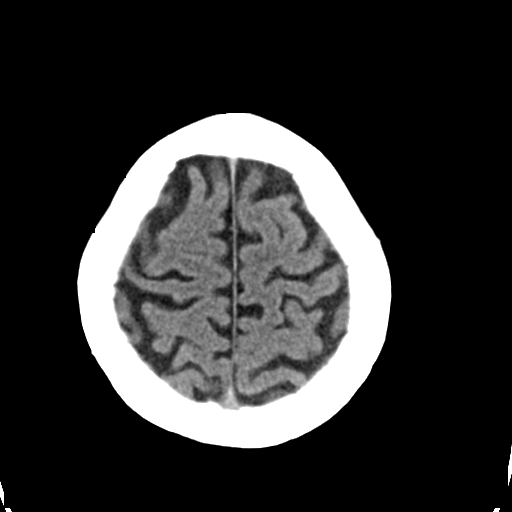
[im 25/31  brain]
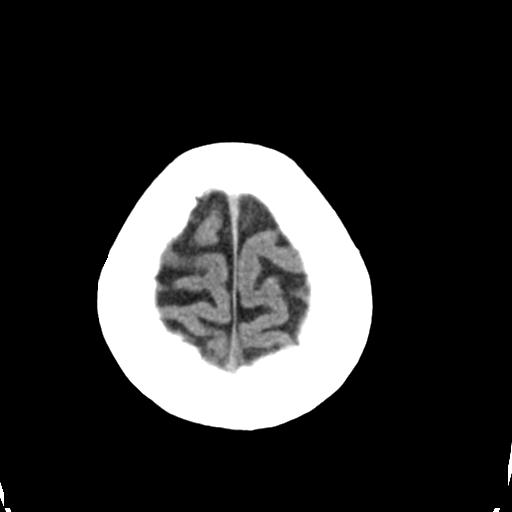
[im 25/31  bone]
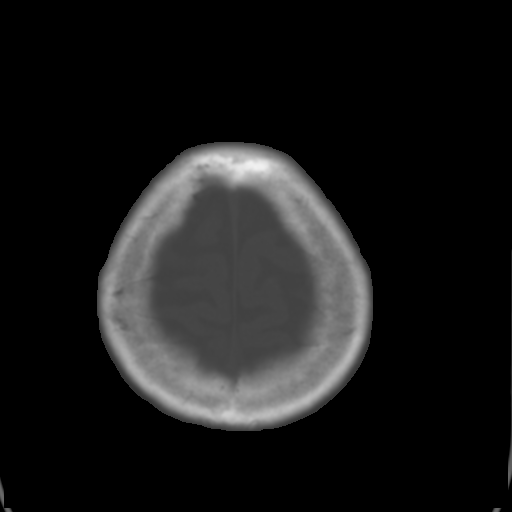
[im 28/31  brain]
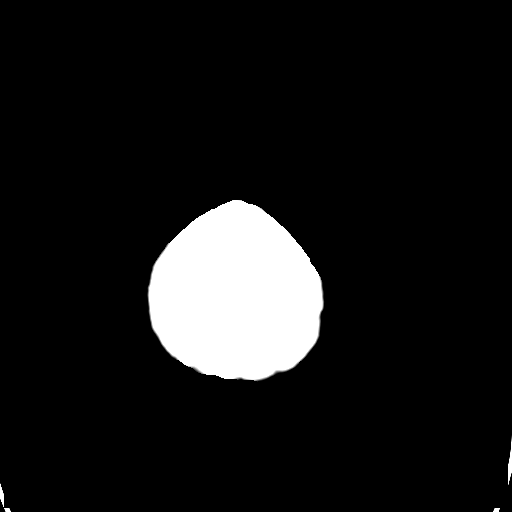

[Series 5: head 3.0 mpr cor · coronal · 0.30mm/px · 3 of 68 slices shown]
[im 23/68  brain]
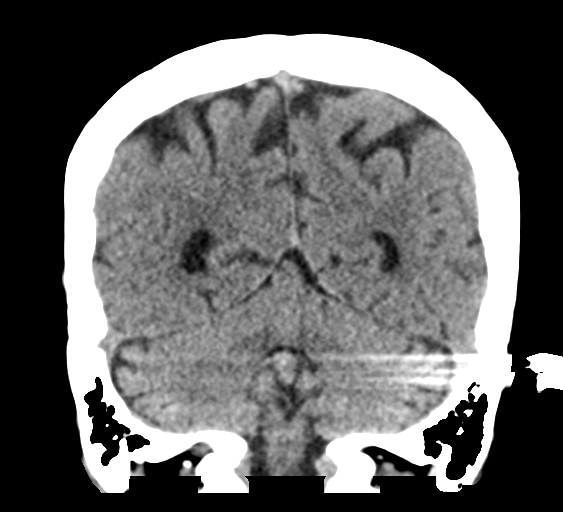
[im 30/68  brain]
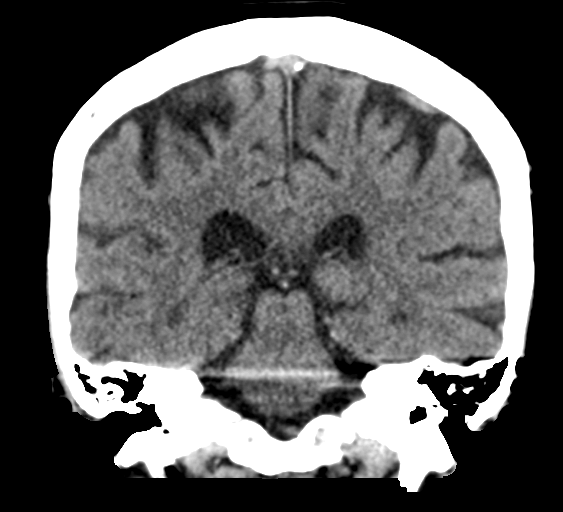
[im 38/68  brain]
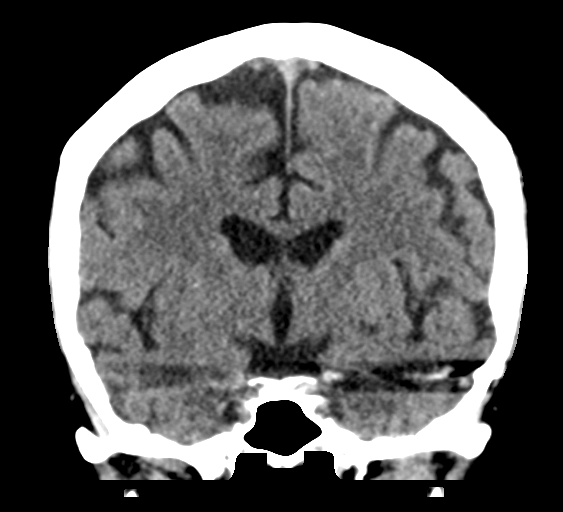

[Series 6: head 3.0 mpr sag · sagittal · 0.30mm/px · 3 of 55 slices shown]
[im 19/55  brain]
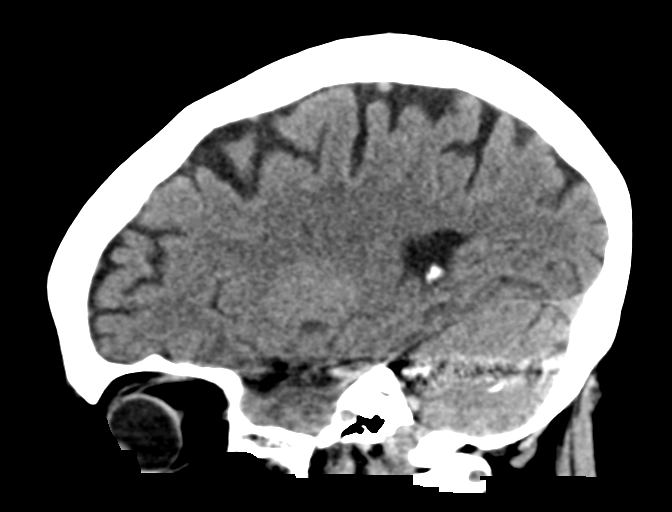
[im 28/55  brain]
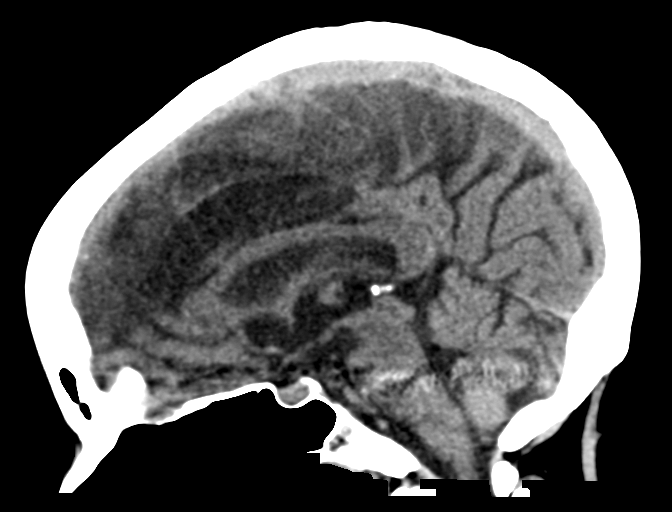
[im 37/55  brain]
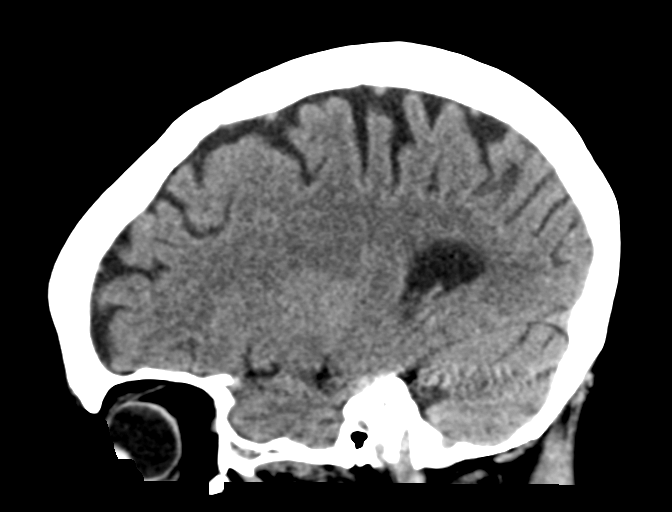

[16 of 47 positions shown; findings below may reference images not displayed]

FINDINGS: Brain: No evidence of acute infarction, hemorrhage, hydrocephalus,
extra-axial collection or mass lesion/mass effect.

Vascular: No hyperdense vessel or unexpected calcification.

Skull: Normal. Negative for fracture or focal lesion.

Sinuses/Orbits: No acute finding.

Other: Left ear metallic object causes streak artifact through the
cerebellum and brainstem.
IMPRESSION: No acute intracranial abnormality.

## 2018-07-22 ENCOUNTER — Encounter: Payer: Self-pay | Admitting: Family Medicine

## 2018-07-22 ENCOUNTER — Other Ambulatory Visit: Payer: Self-pay | Admitting: Family Medicine

## 2018-07-22 ENCOUNTER — Ambulatory Visit: Payer: Self-pay | Admitting: Family Medicine

## 2018-07-22 ENCOUNTER — Ambulatory Visit (INDEPENDENT_AMBULATORY_CARE_PROVIDER_SITE_OTHER): Payer: PRIVATE HEALTH INSURANCE | Admitting: Family Medicine

## 2018-07-22 VITALS — BP 106/78 | HR 74 | Temp 97.6°F | Ht 59.0 in | Wt 141.0 lb

## 2018-07-22 DIAGNOSIS — R252 Cramp and spasm: Secondary | ICD-10-CM | POA: Diagnosis not present

## 2018-07-22 DIAGNOSIS — F419 Anxiety disorder, unspecified: Secondary | ICD-10-CM | POA: Diagnosis not present

## 2018-07-22 DIAGNOSIS — F322 Major depressive disorder, single episode, severe without psychotic features: Secondary | ICD-10-CM | POA: Diagnosis not present

## 2018-07-22 DIAGNOSIS — Z09 Encounter for follow-up examination after completed treatment for conditions other than malignant neoplasm: Secondary | ICD-10-CM

## 2018-07-22 DIAGNOSIS — G4700 Insomnia, unspecified: Secondary | ICD-10-CM

## 2018-07-22 DIAGNOSIS — K5900 Constipation, unspecified: Secondary | ICD-10-CM

## 2018-07-22 LAB — POCT URINALYSIS DIP (MANUAL ENTRY)
Bilirubin, UA: NEGATIVE
Blood, UA: NEGATIVE
Glucose, UA: NEGATIVE mg/dL
Ketones, POC UA: NEGATIVE mg/dL
Leukocytes, UA: NEGATIVE
Nitrite, UA: NEGATIVE
Protein Ur, POC: NEGATIVE mg/dL
Spec Grav, UA: 1.02 (ref 1.010–1.025)
Urobilinogen, UA: 0.2 E.U./dL
pH, UA: 5 (ref 5.0–8.0)

## 2018-07-22 MED ORDER — LACTULOSE 10 GM/15ML PO SOLN: 20.0000 g | Freq: Every day | ORAL | 3 refills | Status: DC

## 2018-07-22 MED ORDER — HYDROXYZINE HCL 25 MG PO TABS: 25.0000 mg | ORAL_TABLET | Freq: Three times a day (TID) | ORAL | 2 refills | Status: DC | PRN

## 2018-07-22 MED ORDER — KETOROLAC TROMETHAMINE 60 MG/2ML IM SOLN
60.0000 mg | Freq: Once | INTRAMUSCULAR | Status: AC
  Administered 2018-07-22: 60 mg via INTRAMUSCULAR

## 2018-07-22 NOTE — Progress Notes (Signed)
Follow Up  Subjective:    Patient ID: Briana Heydt, female    DOB: Apr 12, 1971, 47 y.o.   MRN: [id number]   Chief Complaint  Patient presents with  . Follow-up    Chronic condition    HPI  Ms. Sandeen is a 47 year old female with a past medical history of Seizures, Schizophrenia, Migraine, History of Kidney Stones, Hepatitis C, Hematemesis, GERD, Depression, Cirosis, of Liver, Bipolar Affective Disorder, and Anxiety. She is here today for follow up.    Current Status: Since her last office visit, she is doing well with no complaints. She continues to live in shelter in [place]. Currently she drinks occasionally. She continues to follow up wit Psychiatry as needed.  She denies fevers, chills, fatigue, recent infections, weight loss, and night sweats. She has not had any headaches, visual changes, dizziness, and falls. No chest pain, heart palpitations, cough and shortness of breath reported. No reports of GI problems such as nausea, vomiting, diarrhea, and constipation. She has no reports of blood in stools, dysuria and hematuria. No depression or anxiety reported. She denies pain today.    Past Medical History:  Diagnosis Date  . Anxiety   . Bipolar affective disorder ([place])    With anxiety features  . 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 Depression   . GERD (gastroesophageal reflux disease)   . Hematemesis 02/10/2018  . Hepatitis C 2018   hepatitis c and alcohol related hepatitis  . History of blood transfusion    "blood doesn't clot; I fell down and had to have a transfusion"  . History of kidney stones   . Migraine    "when I get really stressed" (09/01/2017)  . Schizophrenia ([place])   . Seizures ([place])    "when I run out of my RX; lots recently" (09/01/2017)    Family History  Problem Relation Age of Onset  . Lung cancer Mother 29  . Alcohol abuse Mother   . Throat cancer Father 39    Social History   Socioeconomic History  . Marital status:  Divorced    Spouse name: Not on file  . Number of children: Not on file  . Years of education: Not on file  . Highest education level: Not on file  Occupational History  . Occupation: applying for disability  Social Needs  . Financial resource strain: Not on file  . Food insecurity:    Worry: Not on file    Inability: Not on file  . Transportation needs:    Medical: Not on file    Non-medical: Not on file  Tobacco Use  . Smoking status: Never Smoker  . Smokeless tobacco: Never Used  Substance and Sexual Activity  . Alcohol use: Yes    Alcohol/week: 63.0 standard drinks    Types: 63 Cans of beer per week    Comment: weekly "I have cut back"  . Drug use: No  . Sexual activity: Not Currently  Lifestyle  . Physical activity:    Days per week: Not on file    Minutes per session: Not on file  . Stress: Not on file  Relationships  . Social connections:    Talks on phone: Not on file    Gets together: Not on file    Attends religious service: Not on file    Active member of club or organization: Not on file    Attends meetings of clubs or organizations: Not on  file    Relationship status: Not on file  . Intimate partner violence:    Fear of current or ex partner: Not on file    Emotionally abused: Not on file    Physically abused: Not on file    Forced sexual activity: Not on file  Other Topics Concern  . Not on file  Social History Narrative   She moved with a boyfriend to [place] and was followed at [facility].  He died of a massive heart attack in 8/18, per her report, and so she moved back to [place] and is living with a friend.    Past Surgical History:  Procedure Laterality Date  . ESOPHAGOGASTRODUODENOSCOPY N/A 09/03/2017   Procedure: ESOPHAGOGASTRODUODENOSCOPY (EGD);  Surgeon: Doran Stabler, MD;  Location: [facility];  Service: Gastroenterology;  Laterality: N/A;  . FINGER FRACTURE SURGERY Left    "shattered my pinky"  . FRACTURE SURGERY     . IR PARACENTESIS  07/23/2017  . IR PARACENTESIS  07/2017   "did it twice in the same week" (09/01/2017)  . SHOULDER OPEN ROTATOR CUFF REPAIR Right   . TUBAL LIGATION    . VAGINAL HYSTERECTOMY      Immunization History  Administered Date(s) Administered  . Influenza,inj,Quad PF,6+ Mos 06/06/2017, 09/04/2017, 04/22/2018  . PPD Test 05/28/2017  . Pneumococcal Polysaccharide-23 09/04/2017    Current Meds  Medication Sig  . busPIRone (BUSPAR) 10 MG tablet Take 1 tablet (10 mg total) by mouth 2 (two) times daily.  . folic acid (FOLVITE) 1 MG tablet Take 1 tablet (1 mg total) by mouth daily.  . furosemide (LASIX) 20 MG tablet Take 1 tablet (20 mg total) by mouth daily.  [name] Kitchen gabapentin (NEURONTIN) 100 MG capsule Take 2 capsules (200 mg total) by mouth 3 (three) times daily.  . haloperidol (HALDOL) 2 MG tablet Take 1 tablet (2 mg total) by mouth at bedtime.  . hydrOXYzine (ATARAX/VISTARIL) 25 MG tablet Take 1 tablet (25 mg total) by mouth 3 (three) times daily as needed for anxiety.  . pantoprazole (PROTONIX) 40 MG tablet Take 1 tablet (40 mg total) by mouth daily with breakfast.  . phenytoin (DILANTIN) 100 MG ER capsule Take 300 mg by mouth at bedtime.  . potassium chloride SA (K-DUR,KLOR-CON) 20 MEQ tablet Take 1 tablet (20 mEq total) by mouth 2 (two) times daily.  . propranolol (INDERAL) 20 MG tablet Take 1 tablet (20 mg total) by mouth 2 (two) times daily.  [name] Kitchen spironolactone (ALDACTONE) 50 MG tablet Take 1 tablet (50 mg total) by mouth daily.  [name] Kitchen topiramate (TOPAMAX) 50 MG tablet Take 1 tablet (50 mg total) by mouth daily.  . [DISCONTINUED] FLUoxetine (PROZAC) 20 MG capsule Take 1 capsule (20 mg total) by mouth daily. For mood control  . [DISCONTINUED] hydrOXYzine (ATARAX/VISTARIL) 25 MG tablet Take 1 tablet (25 mg total) by mouth 3 (three) times daily as needed for anxiety.  . [DISCONTINUED] traZODone (DESYREL) 50 MG tablet Take 1 tablet (50 mg total) by mouth at bedtime as needed for  sleep.   No Known Allergies  BP 106/78 (BP Location: Left Arm, Patient Position: Sitting, Cuff Size: Small)   Pulse 74   Temp 97.6 F (36.4 C) (Oral)   Ht 4\' 11"  (1.499 m)   Wt 141 lb (64 kg)   SpO2 100%   BMI 28.48 kg/m   Review of Systems  Constitutional: Negative.   HENT: Negative.   Eyes: Negative.   Respiratory: Negative.   Cardiovascular: Negative.  Gastrointestinal: Positive for abdominal distention.  Endocrine: Negative.   Genitourinary: Negative.   Musculoskeletal: Positive for arthralgias (Generalized) and back pain (chronic).       Foot pain  Skin: Negative.   Allergic/Immunologic: Negative.   Neurological: Positive for dizziness, weakness and headaches.  Psychiatric/Behavioral: Negative.    Objective:   Physical Exam  Constitutional: She is oriented to person, place, and time. She appears well-developed and well-nourished.  HENT:  Head: Normocephalic and atraumatic.  Eyes: Pupils are equal, round, and reactive to light. Conjunctivae and EOM are normal.  Neck: Normal range of motion. Neck supple.  Cardiovascular: Normal rate, regular rhythm, normal heart sounds and intact distal pulses.  Pulmonary/Chest: Effort normal and breath sounds normal.  Abdominal: Soft. Bowel sounds are normal.  Musculoskeletal: Normal range of motion.  Neurological: She is alert and oriented to person, place, and time.  Skin: Skin is warm and dry.  Psychiatric: She has a normal mood and affect. Her behavior is normal. Judgment and thought content normal.  Nursing note and vitals reviewed.  Assessment & Plan:   1. MDD (major depressive disorder), severe (HCC) Continue Buspar and Hydroxyzine as prescribe. Continue to follow up with Psychiatry as needed for continued refills of Haldol. Monitor.   - hydrOXYzine (ATARAX/VISTARIL) 25 MG tablet; Take 1 tablet (25 mg total) by mouth 3 (three) times daily as needed for anxiety.  Dispense: 90 tablet; Refill: 2  2. Anxiety - hydrOXYzine  (ATARAX/VISTARIL) 25 MG tablet; Take 1 tablet (25 mg total) by mouth 3 (three) times daily as needed for anxiety.  Dispense: 90 tablet; Refill: 2  3. Insomnia, unspecified type Continue Trazodone as prescribed.   4. Leg cramps  5. Constipation, unspecified constipation type Stable.   6. Follow up He will follow up in 3 months.  - POCT urinalysis dipstick - ketorolac (TORADOL) injection 60 mg  Meds ordered this encounter  Medications  . ketorolac (TORADOL) injection 60 mg  . hydrOXYzine (ATARAX/VISTARIL) 25 MG tablet    Sig: Take 1 tablet (25 mg total) by mouth 3 (three) times daily as needed for anxiety.    Dispense:  90 tablet    Refill:  2  . lactulose (CHRONULAC) 10 GM/15ML solution    Sig: Take 30 mLs (20 g total) by mouth daily.    Dispense:  240 mL    Refill:  [place],  MSN, [facility] [place] [facility] [place] [address] [place], [place] [address] [phone]

## 2018-08-03 ENCOUNTER — Emergency Department (HOSPITAL_COMMUNITY)
Admission: EM | Admit: 2018-08-03 | Discharge: 2018-08-03 | Disposition: A | Discharge status: Home / Self Care | Payer: PRIVATE HEALTH INSURANCE | Attending: Emergency Medicine | Admitting: Emergency Medicine

## 2018-08-03 ENCOUNTER — Emergency Department (HOSPITAL_COMMUNITY): Payer: PRIVATE HEALTH INSURANCE

## 2018-08-03 ENCOUNTER — Other Ambulatory Visit: Payer: Self-pay

## 2018-08-03 ENCOUNTER — Encounter (HOSPITAL_COMMUNITY): Payer: Self-pay | Admitting: Emergency Medicine

## 2018-08-03 DIAGNOSIS — D649 Anemia, unspecified: Secondary | ICD-10-CM | POA: Diagnosis not present

## 2018-08-03 DIAGNOSIS — Z79899 Other long term (current) drug therapy: Secondary | ICD-10-CM | POA: Insufficient documentation

## 2018-08-03 DIAGNOSIS — B182 Chronic viral hepatitis C: Secondary | ICD-10-CM | POA: Insufficient documentation

## 2018-08-03 DIAGNOSIS — F10920 Alcohol use, unspecified with intoxication, uncomplicated: Secondary | ICD-10-CM | POA: Diagnosis not present

## 2018-08-03 DIAGNOSIS — K7030 Alcoholic cirrhosis of liver without ascites: Secondary | ICD-10-CM | POA: Insufficient documentation

## 2018-08-03 DIAGNOSIS — D696 Thrombocytopenia, unspecified: Secondary | ICD-10-CM | POA: Diagnosis not present

## 2018-08-03 DIAGNOSIS — R1084 Generalized abdominal pain: Secondary | ICD-10-CM | POA: Diagnosis not present

## 2018-08-03 DIAGNOSIS — K7460 Unspecified cirrhosis of liver: Secondary | ICD-10-CM

## 2018-08-03 DIAGNOSIS — R109 Unspecified abdominal pain: Secondary | ICD-10-CM | POA: Diagnosis present

## 2018-08-03 DIAGNOSIS — Y906 Blood alcohol level of 120-199 mg/100 ml: Secondary | ICD-10-CM | POA: Diagnosis not present

## 2018-08-03 LAB — COMPREHENSIVE METABOLIC PANEL
ALT: 48 U/L — ABNORMAL HIGH (ref 0–44)
AST: 84 U/L — ABNORMAL HIGH (ref 15–41)
Albumin: 2.9 g/dL — ABNORMAL LOW (ref 3.5–5.0)
Alkaline Phosphatase: 134 U/L — ABNORMAL HIGH (ref 38–126)
Anion gap: 11 (ref 5–15)
BUN: <5 mg/dL — ABNORMAL LOW (ref 6–20)
CO2: 19 mmol/L — ABNORMAL LOW (ref 22–32)
Calcium: 8.2 mg/dL — ABNORMAL LOW (ref 8.9–10.3)
Chloride: 107 mmol/L (ref 98–111)
Creatinine, Ser: 0.69 mg/dL (ref 0.44–1.00)
GFR calc Af Amer: >60 mL/min (ref >60)
GFR calc non Af Amer: >60 mL/min (ref >60)
Glucose, Bld: 99 mg/dL (ref 70–99)
Potassium: 3.7 mmol/L (ref 3.5–5.1)
Sodium: 137 mmol/L (ref 135–145)
Total Bilirubin: 1.1 mg/dL (ref 0.3–1.2)
Total Protein: 7.6 g/dL (ref 6.5–8.1)

## 2018-08-03 LAB — CBC WITH DIFFERENTIAL/PLATELET
Abs Immature Granulocytes: 0.02 10*3/uL (ref 0.00–0.07)
Basophils Absolute: 0 10*3/uL (ref 0.0–0.1)
Basophils Relative: 1 %
Eosinophils Absolute: 0.2 10*3/uL (ref 0.0–0.5)
Eosinophils Relative: 4 %
HCT: 30.9 % — ABNORMAL LOW (ref 36.0–46.0)
Hemoglobin: 9.6 g/dL — ABNORMAL LOW (ref 12.0–15.0)
Immature Granulocytes: 0 %
Lymphocytes Relative: 28 %
Lymphs Abs: 1.5 10*3/uL (ref 0.7–4.0)
MCH: 27 pg (ref 26.0–34.0)
MCHC: 31.1 g/dL (ref 30.0–36.0)
MCV: 87 fL (ref 80.0–100.0)
Monocytes Absolute: 0.4 10*3/uL (ref 0.1–1.0)
Monocytes Relative: 8 %
Neutro Abs: 3.2 10*3/uL (ref 1.7–7.7)
Neutrophils Relative %: 59 %
Platelets: 41 10*3/uL — ABNORMAL LOW (ref 150–400)
RBC: 3.55 MIL/uL — ABNORMAL LOW (ref 3.87–5.11)
RDW: 17.8 % — ABNORMAL HIGH (ref 11.5–15.5)
WBC: 5.4 10*3/uL (ref 4.0–10.5)
nRBC: 0 % (ref 0.0–0.2)

## 2018-08-03 LAB — URINALYSIS, ROUTINE W REFLEX MICROSCOPIC
Bilirubin Urine: NEGATIVE
Glucose, UA: NEGATIVE mg/dL
Hgb urine dipstick: NEGATIVE
Ketones, ur: NEGATIVE mg/dL
Leukocytes, UA: NEGATIVE
Nitrite: NEGATIVE
Protein, ur: NEGATIVE mg/dL
Specific Gravity, Urine: 1.002 — ABNORMAL LOW (ref 1.005–1.030)
pH: 6 (ref 5.0–8.0)

## 2018-08-03 LAB — RAPID URINE DRUG SCREEN, HOSP PERFORMED
Amphetamines: NOT DETECTED
Barbiturates: NOT DETECTED
Benzodiazepines: NOT DETECTED
Cocaine: NOT DETECTED
Opiates: NOT DETECTED
Tetrahydrocannabinol: NOT DETECTED

## 2018-08-03 LAB — PROTIME-INR
INR: 1.32
Prothrombin Time: 16.2 seconds — ABNORMAL HIGH (ref 11.4–15.2)

## 2018-08-03 LAB — LIPASE, BLOOD: Lipase: 67 U/L — ABNORMAL HIGH (ref 11–51)

## 2018-08-03 LAB — ETHANOL: Alcohol, Ethyl (B): 176 mg/dL — ABNORMAL HIGH (ref <10)

## 2018-08-03 MED ORDER — IBUPROFEN 400 MG PO TABS
400.0000 mg | ORAL_TABLET | Freq: Once | ORAL | Status: AC
  Administered 2018-08-03: 400 mg via ORAL
  Filled 2018-08-03: qty 1

## 2018-08-03 MED ORDER — ALUM & MAG HYDROXIDE-SIMETH 200-200-20 MG/5ML PO SUSP
30.0000 mL | Freq: Once | ORAL | Status: AC
  Administered 2018-08-03: 30 mL via ORAL
  Filled 2018-08-03: qty 30

## 2018-08-03 MED ORDER — LIDOCAINE VISCOUS HCL 2 % MT SOLN
15.0000 mL | Freq: Once | OROMUCOSAL | Status: AC
  Administered 2018-08-03: 15 mL via ORAL
  Filled 2018-08-03: qty 15

## 2018-08-03 MED ORDER — FAMOTIDINE IN NACL 20-0.9 MG/50ML-% IV SOLN
20.0000 mg | Freq: Once | INTRAVENOUS | Status: AC
  Administered 2018-08-03: 20 mg via INTRAVENOUS
  Filled 2018-08-03: qty 50

## 2018-08-03 NOTE — ED Notes (Signed)
Pt refusing occult blood card

## 2018-08-03 NOTE — ED Provider Notes (Signed)
,   With dizziness. Face-to-face evaluation   History: Patient presents for evaluation of abdominal discomfort.  Patient is a heavy drinker and reportedly has cirrhosis.  She was ED at [facility], 5 days ago with very high alcohol level.  At that time she was homeless.  She was hospitalized October 2019 at [facility].  At that time she had multi drug overdose as a suicide attempt.  She was evaluated comprehensively, during the hospitalization.  Hemoglobin level at that time was 11.3.  Physical exam:    Clinical Course as of Aug 04 2247  Mon Aug 03, 2018  2246 Normal  Urinalysis, Routine w reflex microscopic(!) [EW]  2246 Normal except hemoglobin 9.6 and platelets 41, both low  CBC with Differential(!) [EW]  2247 Mild elevation  Lipase, blood(!) [EW]  2247 Normal  Rapid urine drug screen (hospital performed) [EW]  2247 Normal except CO2 low, BUN low, calcium low, albumin low, AST high, ALT high, alk phos stays high  Comprehensive metabolic panel(!) [EW]  0122 High  Ethanol(!) [EW]  2247 Slight elevation  Protime-INR(!) [EW]  2247 Patient refused  POC occult blood, ED RN will collect [EW]    Clinical Course User Index [EW] Daleen Bo, MD   Medical decision-making: nonspecific symptoms with alcohol intoxication, recurrent.  Mild anemia, hemoglobin currently 1.5 g lower than when recently checked about a month ago.  No overt sign of blood loss.  Medical screening examination/treatment/procedure(s) were conducted as a shared visit with non-physician practitioner(s) and myself.  I personally evaluated the patient during the encounter    Daleen Bo, MD 08/05/18 2008

## 2018-08-03 NOTE — ED Triage Notes (Signed)
Patient presents to the ED by EMS with c/o abdominal pain and dizziness. Here 3 weeks ago same symptoms of GERD, nausea and diarrhea. Dizziness when her platelets are low.  Pt is non compliant with meds per EMS states she cant get to them.

## 2018-08-03 NOTE — ED Notes (Signed)
Pt requested transportation to [facility]. Cab voucher to be provided.

## 2018-08-03 NOTE — ED Provider Notes (Signed)
[facility] EMERGENCY DEPARTMENT Provider Note   CSN: [id number] Arrival date & time: 08/03/18  1631     History   Chief Complaint Chief Complaint  Patient presents with  . Abdominal Pain  . Dizziness    HPI Briana Sweeney is a 47 y.o. female with history of anxiety, bipolar affective disorder, alcoholic liver cirrhosis, hepatitis C, migraines, schizophrenia, seizures presents for evaluation of acute onset, progressively worsening abdominal pain and distention for 2 weeks.  Notes burning pain worse on the right side of the abdomen radiating up to her throat.  Pain worsens sitting upright/bending.  Feels short of breath due to her abdominal distention.  Denies chest pain, fevers, dysuria, hematuria.  She notes decreased urine output despite taking Lasix.  She reports several episodes of nonbloody nonbilious emesis daily and ongoing nausea.  Also notes lightheadedness when standing which occurs "when my platelets are low ".  States that she has been out of some of her medicines after leaving them out of friend's house 3 days ago but still has access to other medications.  Denies melena or hematochezia.  States "I feel like I have to get fluid tapped from my belly".  Denies recreational drug use or cigarette smoking but has had 4-5 beers today.  The history is provided by the patient.    Past Medical History:  Diagnosis Date  . Anxiety   . Bipolar affective disorder ([place])    With anxiety features  . 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 Depression   . GERD (gastroesophageal reflux disease)   . Hematemesis 02/10/2018  . Hepatitis C 2018   hepatitis c and alcohol related hepatitis  . History of blood transfusion    "blood doesn't clot; I fell down and had to have a transfusion"  . History of kidney stones   . Migraine    "when I get really stressed" (09/01/2017)  . Schizophrenia ([place])   . Seizures ([place])    "when I run out of my RX; lots  recently" (09/01/2017)    Patient Active Problem List   Diagnosis Date Noted  . Upper GI bleed 02/10/2018  . Alcohol withdrawal, with unspecified complication ([place]) 35/32/9924  . Chronic anemia   . Thrombocytopenia due to drugs   . Suicidal ideation   . Thrombocytopenia ([place]) 01/02/2018  . GERD (gastroesophageal reflux disease) 11/01/2017  . Trichimoniasis 10/30/2017  . Alcohol dependence ([place]) 10/29/2017  . MDD (major depressive disorder), severe ([place]) 10/28/2017  . Acute hyperactive alcohol withdrawal delirium ([place]) 10/09/2017  . Schizophrenia ([place]) 10/09/2017  . Alcohol abuse with alcohol-induced mood disorder ([place])   . Alcohol withdrawal syndrome with complication ([place]) 26/83/4196  . Esophageal varices without bleeding ([place])   . Hematemesis 09/01/2017  . Ascites due to alcoholic cirrhosis ([place])   . Decompensated hepatic cirrhosis ([place]) 07/23/2017  . Alcohol abuse 07/23/2017  . Hepatitis C antibody positive in blood 07/23/2017  . Hypokalemia 07/23/2017  . Jaundice 07/23/2017  . Coagulopathy ([place]) 07/23/2017  . Hypomagnesemia 07/23/2017  . Pancytopenia ([place]) 07/23/2017  . Alcoholic cirrhosis of liver with ascites ([place]) 07/23/2017    Past Surgical History:  Procedure Laterality Date  . ESOPHAGOGASTRODUODENOSCOPY N/A 09/03/2017   Procedure: ESOPHAGOGASTRODUODENOSCOPY (EGD);  Surgeon: Doran Stabler, MD;  Location: [facility];  Service: Gastroenterology;  Laterality: N/A;  . FINGER FRACTURE SURGERY Left    "shattered my pinky"  . FRACTURE SURGERY    . IR PARACENTESIS  07/23/2017  . IR PARACENTESIS  07/2017   "did it twice in the same week" (09/01/2017)  . SHOULDER OPEN ROTATOR CUFF REPAIR Right   . TUBAL LIGATION    . VAGINAL HYSTERECTOMY       OB History   None      Home Medications    Prior to Admission medications   Medication Sig Start Date End Date Taking? Authorizing Provider  busPIRone (BUSPAR) 10 MG tablet Take 1 tablet (10 mg total) by mouth 2  (two) times daily. 06/04/18  Yes [name], FNP  folic acid (FOLVITE) 1 MG tablet Take 1 tablet (1 mg total) by mouth daily. 02/15/18  Yes Phillips Grout, MD  furosemide (LASIX) 20 MG tablet Take 1 tablet (20 mg total) by mouth daily. 06/04/18  Yes [name], FNP  gabapentin (NEURONTIN) 100 MG capsule Take 2 capsules (200 mg total) by mouth 3 (three) times daily. 07/08/18  Yes [name], FNP  haloperidol (HALDOL) 2 MG tablet Take 1 tablet (2 mg total) by mouth at bedtime. 02/20/18  Yes [name], FNP  hydrOXYzine (ATARAX/VISTARIL) 25 MG tablet Take 1 tablet (25 mg total) by mouth 3 (three) times daily as needed for anxiety. 07/22/18  Yes [name], FNP  lactulose (CHRONULAC) 10 GM/15ML solution Take 30 mLs (20 g total) by mouth daily. 07/22/18  Yes [name], FNP  pantoprazole (PROTONIX) 40 MG tablet Take 1 tablet (40 mg total) by mouth daily with breakfast. 02/15/18  Yes Phillips Grout, MD  phenytoin (DILANTIN) 100 MG ER capsule Take 300 mg by mouth at bedtime.   Yes [provider]  potassium chloride SA (K-DUR,KLOR-CON) 20 MEQ tablet Take 1 tablet (20 mEq total) by mouth 2 (two) times daily. 05/24/18  Yes Rolland Porter, MD  propranolol (INDERAL) 20 MG tablet Take 1 tablet (20 mg total) by mouth 2 (two) times daily. 07/08/18  Yes [name], FNP  spironolactone (ALDACTONE) 50 MG tablet Take 1 tablet (50 mg total) by mouth daily. 06/04/18  Yes [name], FNP  topiramate (TOPAMAX) 50 MG tablet Take 1 tablet (50 mg total) by mouth daily. 06/04/18  Yes [name], FNP  Multiple Vitamin (MULTIVITAMIN WITH MINERALS) TABS tablet Take 1 tablet by mouth daily. Patient not taking: Reported on 07/22/2018 01/09/18   Eugenie Filler, MD  FLUoxetine (PROZAC) 20 MG capsule Take 1 capsule (20 mg total) by mouth daily. For mood control 07/08/18 07/22/18  [name], FNP  traZODone (DESYREL) 50 MG tablet Take 1 tablet (50 mg total) by mouth  at bedtime as needed for sleep. 07/08/18 07/22/18  [name], FNP    Family History Family History  Problem Relation Age of Onset  . Lung cancer Mother 54  . Alcohol abuse Mother   . Throat cancer Father 36    Social History Social History   Tobacco Use  . Smoking status: Never Smoker  . Smokeless tobacco: Never Used  Substance Use Topics  . Alcohol use: Yes    Alcohol/week: 63.0 standard drinks    Types: 63 Cans of beer per week    Comment: weekly "I have cut back"  . Drug use: No     Allergies   Patient has no known allergies.   Review of Systems Review of Systems  Constitutional: Negative for chills and fever.  Eyes: Negative for visual disturbance.  Respiratory: Positive for shortness of breath.   Cardiovascular: Positive for  leg swelling. Negative for chest pain.  Gastrointestinal: Positive for abdominal pain, constipation, diarrhea, nausea and vomiting. Negative for blood in stool.  Neurological: Positive for light-headedness. Negative for dizziness and syncope.  All other systems reviewed and are negative.    Physical Exam Updated Vital Signs BP 116/70 (BP Location: Right Arm)   Pulse 72   Temp 98.4 F (36.9 C) (Oral)   Resp 15   Ht 4\' 11"  (1.499 m)   Wt 63.5 kg   SpO2 100%   BMI 28.28 kg/m   Physical Exam  Constitutional: She appears well-developed and well-nourished. No distress.  HENT:  Head: Normocephalic and atraumatic.  Eyes: Conjunctivae are normal. Right eye exhibits no discharge. Left eye exhibits no discharge.  Neck: No JVD present. No tracheal deviation present.  Cardiovascular: Normal rate, regular rhythm and intact distal pulses.  Trace pitting edema of the bilateral lower extremities, Homans sign absent bilaterally  Pulmonary/Chest: Effort normal and breath sounds normal.  Abdominal: Soft. She exhibits distension and fluid wave. Bowel sounds are increased. There is tenderness in the right upper quadrant, right lower  quadrant, epigastric area, periumbilical area and suprapubic area. There is no rigidity, no rebound, no guarding, no CVA tenderness, no tenderness at McBurney's point and negative Murphy's sign.  Musculoskeletal: She exhibits no edema.  No midline spine TTP, no paraspinal muscle tenderness, no deformity, crepitus, or step-off noted   Neurological: She is alert.  Skin: Skin is warm and dry. No erythema.  Psychiatric: She has a normal mood and affect. Her behavior is normal.  Nursing note and vitals reviewed.    ED Treatments / Results  Labs (all labs ordered are listed, but only abnormal results are displayed) Labs Reviewed  CBC WITH DIFFERENTIAL/PLATELET - Abnormal; Notable for the following components:      Result Value   RBC 3.55 (*)    Hemoglobin 9.6 (*)    HCT 30.9 (*)    RDW 17.8 (*)    Platelets 41 (*)    All other components within normal limits  COMPREHENSIVE METABOLIC PANEL - Abnormal; Notable for the following components:   CO2 19 (*)    BUN <5 (*)    Calcium 8.2 (*)    Albumin 2.9 (*)    AST 84 (*)    ALT 48 (*)    Alkaline Phosphatase 134 (*)    All other components within normal limits  LIPASE, BLOOD - Abnormal; Notable for the following components:   Lipase 67 (*)    All other components within normal limits  URINALYSIS, ROUTINE W REFLEX MICROSCOPIC - Abnormal; Notable for the following components:   Color, Urine STRAW (*)    Specific Gravity, Urine 1.002 (*)    All other components within normal limits  ETHANOL - Abnormal; Notable for the following components:   Alcohol, Ethyl (B) 176 (*)    All other components within normal limits  PROTIME-INR - Abnormal; Notable for the following components:   Prothrombin Time 16.2 (*)    All other components within normal limits  RAPID URINE DRUG SCREEN, HOSP PERFORMED    EKG EKG Interpretation  Date/Time:  Monday August 03 2018 17:54:21 EST Ventricular Rate:  103 PR Interval:    QRS Duration: 70 QT  Interval:  368 QTC Calculation: 482 R Axis:   75 Text Interpretation:  Sinus tachycardia Minimal ST elevation, inferior leads Since last tracing rate faster Confirmed by Daleen Bo [phone]) on 08/03/2018 7:37:44 PM Also confirmed by Daleen Bo [phone]), editor  Hattie Perch ([id number])  on 08/04/2018 7:10:30 AM   Radiology Dg Chest 2 View  Result Date: 08/03/2018 CLINICAL DATA:  Chest pain and shortness of breath. History of cirrhosis. EXAM: CHEST - 2 VIEW COMPARISON:  07/16/2018 FINDINGS: Cardiomediastinal silhouette is normal. Mediastinal contours appear intact. Chronic calcified left hilar lymph nodes. There is no evidence of pleural effusion or pneumothorax. Bilateral lower lobe predominant peribronchial thickening. Chronic calcified left lower lobe granulomas. Osseous structures are without acute abnormality. Soft tissues are grossly normal. IMPRESSION: Bilateral lower lobe peribronchial airspace consolidation may represent acute bronchitis or early developing bronchopneumonia. Electronically Signed   By: Fidela Salisbury M.D.   On: 08/03/2018 19:20    Procedures Procedures (including critical care time)  Medications Ordered in ED Medications  famotidine (PEPCID) IVPB 20 mg premix (0 mg Intravenous Stopped 08/03/18 1919)  alum & mag hydroxide-simeth (MAALOX/MYLANTA) 200-200-20 MG/5ML suspension 30 mL (30 mLs Oral Given 08/03/18 1817)    And  lidocaine (XYLOCAINE) 2 % viscous mouth solution 15 mL (15 mLs Oral Given 08/03/18 1817)  ibuprofen (ADVIL,MOTRIN) tablet 400 mg (400 mg Oral Given 08/03/18 2303)     Initial Impression / Assessment and Plan / ED Course  I have reviewed the triage vital signs and the nursing notes.  Pertinent labs & imaging results that were available during my care of the patient were reviewed by me and considered in my medical decision making (see chart for details).  Patient presenting for evaluation of abdominal distention, reflux symptoms, and  lightheadedness.  She is afebrile, initially mildly tachycardic, vital signs otherwise stable.  This did resolve on reevaluation.  She is nontoxic in appearance.  She is seen frequently in this ER and others in the surrounding area for similar symptoms.  Abdomen is soft, no peritoneal signs.  Lab work reviewed by me is essentially at the patient's baseline.  She has had a drop in her hemoglobin compared to labs from October but refused Hemoccult card so I am unable to fully evaluate for possible GI bleed.  LFTs chronically elevated.  Platelet count of 41, this is around her baseline and is in fact improved from last set of lab work.  EKG with faster rate but otherwise no acute changes.  Chest x-ray suggest possible acute bronchitis versus bronchopneumonia but she denies any cough.  Doubt acute surgical abdominal pathology.  10:10PM Signed out to Dr. Eulis Foster for further evaluation and reassessment.  Patient likely stable for discharge home.   Final Clinical Impressions(s) / ED Diagnoses   Final diagnoses:  Generalized abdominal pain  Alcoholic intoxication without complication (HCC)  Anemia, unspecified type  Thrombocytopenia (HCC)  Hepatic cirrhosis, unspecified hepatic cirrhosis type, unspecified whether ascites present [facility])    ED Discharge Orders    None       Debroah Baller 08/04/18 1940    Daleen Bo, MD 08/05/18 2008

## 2018-08-03 NOTE — ED Notes (Signed)
Pt back from X-ray.  

## 2018-08-03 NOTE — Discharge Instructions (Addendum)
Follow-up with your doctor who can arrange for further testing and treatment of your liver disorder.  Avoid drinking all forms of alcohol.  When you see your doctor asked her to check your blood count to see how your hemoglobin level and platelets are doing.

## 2018-08-03 NOTE — ED Notes (Signed)
Pt now complaining that her skin has been weeping around her stomach.

## 2018-08-04 MED FILL — GABAPENTIN 100 MG CAPSULE: 100 | 30 days supply | Qty: 180 | Fill #2

## 2018-08-05 MED FILL — hydrOXYzine HCL 25 MG TABS: 25 | 30 days supply | Qty: 90 | Fill #2

## 2018-08-05 MED FILL — TOPIRAMATE 50 MG TABLET: 50 | 30 days supply | Qty: 30 | Fill #2

## 2018-08-05 MED FILL — busPIRone HCL 10 MG TABS: 10 | 15 days supply | Qty: 30 | Fill #2

## 2018-08-05 MED FILL — SPIRONOLACTONE 50 MG TABLET: 50 | 30 days supply | Qty: 30 | Fill #1

## 2018-08-05 MED FILL — traZODone HCL 50 MG TABS: 50 | 15 days supply | Qty: 15 | Fill #0

## 2018-08-05 MED FILL — FLUoxetine HCL 20 MG TABS: 20 | 30 days supply | Qty: 30 | Fill #1

## 2018-08-05 MED FILL — FUROSEMIDE 20 MG TABLET: 20 | 30 days supply | Qty: 30 | Fill #1

## 2018-08-05 MED FILL — PROPRANOLOL 20 MG TABLET: 20 | 30 days supply | Qty: 60 | Fill #1

## 2018-08-10 ENCOUNTER — Encounter (HOSPITAL_COMMUNITY): Payer: Self-pay

## 2018-08-10 ENCOUNTER — Other Ambulatory Visit: Payer: Self-pay

## 2018-08-10 ENCOUNTER — Emergency Department (HOSPITAL_COMMUNITY): Payer: PRIVATE HEALTH INSURANCE

## 2018-08-10 ENCOUNTER — Emergency Department (HOSPITAL_COMMUNITY)
Admission: EM | Admit: 2018-08-10 | Discharge: 2018-08-10 | Disposition: A | Discharge status: Home / Self Care | Payer: PRIVATE HEALTH INSURANCE | Attending: Emergency Medicine | Admitting: Emergency Medicine

## 2018-08-10 DIAGNOSIS — Z79899 Other long term (current) drug therapy: Secondary | ICD-10-CM | POA: Diagnosis not present

## 2018-08-10 DIAGNOSIS — R51 Headache: Secondary | ICD-10-CM | POA: Diagnosis not present

## 2018-08-10 DIAGNOSIS — Y908 Blood alcohol level of 240 mg/100 ml or more: Secondary | ICD-10-CM | POA: Diagnosis not present

## 2018-08-10 DIAGNOSIS — M542 Cervicalgia: Secondary | ICD-10-CM | POA: Diagnosis not present

## 2018-08-10 DIAGNOSIS — F10920 Alcohol use, unspecified with intoxication, uncomplicated: Secondary | ICD-10-CM | POA: Insufficient documentation

## 2018-08-10 DIAGNOSIS — R519 Headache, unspecified: Secondary | ICD-10-CM

## 2018-08-10 DIAGNOSIS — R4182 Altered mental status, unspecified: Secondary | ICD-10-CM | POA: Diagnosis present

## 2018-08-10 LAB — ETHANOL: Alcohol, Ethyl (B): 382 mg/dL (ref <10)

## 2018-08-10 MED ORDER — CHLORDIAZEPOXIDE HCL 25 MG PO CAPS: ORAL_CAPSULE | ORAL | 0 refills | Status: DC

## 2018-08-10 MED ORDER — ONDANSETRON 4 MG PO TBDP
4.0000 mg | ORAL_TABLET | Freq: Once | ORAL | Status: AC
  Administered 2018-08-10: 4 mg via ORAL
  Filled 2018-08-10: qty 1

## 2018-08-10 MED ORDER — CHLORDIAZEPOXIDE HCL 25 MG PO CAPS
25.0000 mg | ORAL_CAPSULE | Freq: Once | ORAL | Status: AC
  Administered 2018-08-10: 25 mg via ORAL
  Filled 2018-08-10: qty 1

## 2018-08-10 MED ORDER — ACETAMINOPHEN 500 MG PO TABS
1000.0000 mg | ORAL_TABLET | Freq: Once | ORAL | Status: AC
  Administered 2018-08-10: 1000 mg via ORAL
  Filled 2018-08-10: qty 2

## 2018-08-10 NOTE — ED Triage Notes (Signed)
Pt brought in by EMS due to having a sudden onset of AMS. Pt c/o headache. Per EMS, pt had 3 beers. Pt a&ox1.

## 2018-08-10 NOTE — ED Notes (Signed)
Patient verbalizes understanding of discharge instructions. Opportunity for questioning and answers were provided. Armband removed by staff, pt discharged from ED.  

## 2018-08-10 NOTE — ED Notes (Signed)
EDP notified of ETOH level

## 2018-08-10 NOTE — ED Provider Notes (Signed)
[facility] EMERGENCY DEPARTMENT Provider Note   CSN: [id number] Arrival date & time: 08/10/18  1614     History   Chief Complaint Chief Complaint  Patient presents with  . Altered Mental Status    HPI Briana Sweeney is a 47 y.o. female.  47 yo F with a chief complaint of a posterior headache and confusion.  Patient states that she has been drinking all day today but has not had that much to drink.  She is not sure why she is so confused.  She thinks maybe she fell.  Level 5 caveat altered mental status.  The history is provided by the patient.  Altered Mental Status   This is a new problem. The current episode started yesterday. The problem has not changed since onset.Associated symptoms include confusion.    Past Medical History:  Diagnosis Date  . Anxiety   . Bipolar affective disorder ([place])    With anxiety features  . 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 Depression   . GERD (gastroesophageal reflux disease)   . Hematemesis 02/10/2018  . Hepatitis C 2018   hepatitis c and alcohol related hepatitis  . History of blood transfusion    "blood doesn't clot; I fell down and had to have a transfusion"  . History of kidney stones   . Migraine    "when I get really stressed" (09/01/2017)  . Schizophrenia ([place])   . Seizures ([place])    "when I run out of my RX; lots recently" (09/01/2017)    Patient Active Problem List   Diagnosis Date Noted  . Upper GI bleed 02/10/2018  . Alcohol withdrawal, with unspecified complication ([place]) 46/27/0350  . Chronic anemia   . Thrombocytopenia due to drugs   . Suicidal ideation   . Thrombocytopenia ([place]) 01/02/2018  . GERD (gastroesophageal reflux disease) 11/01/2017  . Trichimoniasis 10/30/2017  . Alcohol dependence ([place]) 10/29/2017  . MDD (major depressive disorder), severe ([place]) 10/28/2017  . Acute hyperactive alcohol withdrawal delirium ([place]) 10/09/2017  . Schizophrenia ([place]) 10/09/2017    . Alcohol abuse with alcohol-induced mood disorder ([place])   . Alcohol withdrawal syndrome with complication ([place]) 09/38/1829  . 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 Hematemesis 09/01/2017  . Ascites due to alcoholic cirrhosis ([place])   . Decompensated hepatic cirrhosis ([place]) 07/23/2017  . Alcohol abuse 07/23/2017  . Hepatitis C antibody positive in blood 07/23/2017  . Hypokalemia 07/23/2017  . Jaundice 07/23/2017  . Coagulopathy ([place]) 07/23/2017  . Hypomagnesemia 07/23/2017  . Pancytopenia ([place]) 07/23/2017  . Alcoholic cirrhosis of liver with ascites ([place]) 07/23/2017    Past Surgical History:  Procedure Laterality Date  . ESOPHAGOGASTRODUODENOSCOPY N/A 09/03/2017   Procedure: ESOPHAGOGASTRODUODENOSCOPY (EGD);  Surgeon: Doran Stabler, MD;  Location: [facility];  Service: Gastroenterology;  Laterality: N/A;  . FINGER FRACTURE SURGERY Left    "shattered my pinky"  . FRACTURE SURGERY    . IR PARACENTESIS  07/23/2017  . IR PARACENTESIS  07/2017   "did it twice in the same week" (09/01/2017)  . SHOULDER OPEN ROTATOR CUFF REPAIR Right   . TUBAL LIGATION    . VAGINAL HYSTERECTOMY       OB History   None      Home Medications    Prior to Admission medications   Medication Sig Start Date End Date Taking? Authorizing Provider  busPIRone (BUSPAR) 10 MG tablet Take 1 tablet (10 mg total) by mouth  2 (two) times daily. 06/04/18   [name], FNP  chlordiazePOXIDE (LIBRIUM) 25 MG capsule 50mg  PO TID x 1D, then 25-50mg  PO BID X 1D, then 25-50mg  PO QD X 1D 08/10/18   Deno Etienne, DO  folic acid (FOLVITE) 1 MG tablet Take 1 tablet (1 mg total) by mouth daily. 02/15/18   Phillips Grout, MD  furosemide (LASIX) 20 MG tablet Take 1 tablet (20 mg total) by mouth daily. 06/04/18   [name], FNP  gabapentin (NEURONTIN) 100 MG capsule Take 2 capsules (200 mg total) by mouth 3 (three) times daily. 07/08/18   [name], FNP  haloperidol (HALDOL) 2 MG tablet Take 1  tablet (2 mg total) by mouth at bedtime. 02/20/18   [name], FNP  hydrOXYzine (ATARAX/VISTARIL) 25 MG tablet Take 1 tablet (25 mg total) by mouth 3 (three) times daily as needed for anxiety. 07/22/18   [name], FNP  lactulose (CHRONULAC) 10 GM/15ML solution Take 30 mLs (20 g total) by mouth daily. 07/22/18   [name], FNP  Multiple Vitamin (MULTIVITAMIN WITH MINERALS) TABS tablet Take 1 tablet by mouth daily. Patient not taking: Reported on 07/22/2018 01/09/18   Eugenie Filler, MD  pantoprazole (PROTONIX) 40 MG tablet Take 1 tablet (40 mg total) by mouth daily with breakfast. 02/15/18   Phillips Grout, MD  phenytoin (DILANTIN) 100 MG ER capsule Take 300 mg by mouth at bedtime.    [provider]  potassium chloride SA (K-DUR,KLOR-CON) 20 MEQ tablet Take 1 tablet (20 mEq total) by mouth 2 (two) times daily. 05/24/18   Rolland Porter, MD  propranolol (INDERAL) 20 MG tablet Take 1 tablet (20 mg total) by mouth 2 (two) times daily. 07/08/18   [name], FNP  spironolactone (ALDACTONE) 50 MG tablet Take 1 tablet (50 mg total) by mouth daily. 06/04/18   [name], FNP  topiramate (TOPAMAX) 50 MG tablet Take 1 tablet (50 mg total) by mouth daily. 06/04/18   [name], FNP  FLUoxetine (PROZAC) 20 MG capsule Take 1 capsule (20 mg total) by mouth daily. For mood control 07/08/18 07/22/18  [name], FNP  traZODone (DESYREL) 50 MG tablet Take 1 tablet (50 mg total) by mouth at bedtime as needed for sleep. 07/08/18 07/22/18  [name], FNP    Family History Family History  Problem Relation Age of Onset  . Lung cancer Mother 35  . Alcohol abuse Mother   . Throat cancer Father 45    Social History Social History   Tobacco Use  . Smoking status: Never Smoker  . Smokeless tobacco: Never Used  Substance Use Topics  . Alcohol use: Yes    Alcohol/week: 63.0 standard drinks    Types: 63 Cans of beer per week    Comment: weekly "I  have cut back"  . Drug use: No     Allergies   Patient has no known allergies.   Review of Systems Review of Systems  Constitutional: Negative for chills and fever.  HENT: Negative for congestion and rhinorrhea.   Eyes: Negative for redness and visual disturbance.  Respiratory: Negative for shortness of breath and wheezing.   Cardiovascular: Negative for chest pain and palpitations.  Gastrointestinal: Negative for nausea and vomiting.  Genitourinary: Negative for dysuria and urgency.  Musculoskeletal: Negative for arthralgias and myalgias.  Skin: Negative for pallor and wound.  Neurological: Negative for dizziness and headaches.  Psychiatric/Behavioral: Positive for confusion.  Physical Exam Updated Vital Signs BP 102/71   Pulse (!) 29   Ht 4\' 11"  (1.499 m)   Wt 63.5 kg   SpO2 96%   BMI 28.27 kg/m   Physical Exam  Constitutional: She appears well-developed and well-nourished. No distress.  Clinically intoxicated  HENT:  Head: Normocephalic and atraumatic.  Eyes: Pupils are equal, round, and reactive to light. EOM are normal.  Neck: Normal range of motion. Neck supple.  Cardiovascular: Normal rate and regular rhythm. Exam reveals no gallop and no friction rub.  No murmur heard. Pulmonary/Chest: Effort normal. She has no wheezes. She has no rales.  Abdominal: Soft. She exhibits no distension. There is no tenderness.  Musculoskeletal: She exhibits no edema or tenderness.  Complaining of tenderness to the left lateral neck, difficult exam due to intoxication  Neurological: She is alert.  Skin: Skin is warm and dry. She is not diaphoretic.  Psychiatric: She has a normal mood and affect. Her behavior is normal.  Nursing note and vitals reviewed.    ED Treatments / Results  Labs (all labs ordered are listed, but only abnormal results are displayed) Labs Reviewed  ETHANOL - Abnormal; Notable for the following components:      Result Value   Alcohol, Ethyl (B)  382 (*)    All other components within normal limits    EKG None  Radiology Ct Head Wo Contrast  Result Date: 08/10/2018 CLINICAL DATA:  Sudden onset of altered mental status. Patient reports headache. EXAM: CT HEAD WITHOUT CONTRAST CT CERVICAL SPINE WITHOUT CONTRAST TECHNIQUE: Multidetector CT imaging of the head and cervical spine was performed following the standard protocol without intravenous contrast. Multiplanar CT image reconstructions of the cervical spine were also generated. COMPARISON:  07/16/2018 FINDINGS: CT HEAD FINDINGS Brain: No evidence of acute infarction, hemorrhage, hydrocephalus, extra-axial collection or mass lesion/mass effect. Calcification in the left cerebral hemisphere is again noted and appears unchanged. Vascular: No hyperdense vessel or unexpected calcification. Skull: Normal. Negative for fracture or focal lesion. Sinuses/Orbits: No acute finding. Other: None CT CERVICAL SPINE FINDINGS Alignment: Straightening of normal cervical lordosis. Skull base and vertebrae: No acute fracture. No primary bone lesion or focal pathologic process. Soft tissues and spinal canal: No prevertebral fluid or swelling. No visible canal hematoma. Disc levels: Multi level disc space narrowing and endplate spurring noted. Most advanced at C5-6. Upper chest: Negative. Other: None IMPRESSION: 1. No acute intracranial abnormalities.  Normal brain. 2. No evidence for cervical spine fracture. 3. Cervical degenerative disc disease. Electronically Signed   By: Kerby Moors M.D.   On: 08/10/2018 18:10   Ct Cervical Spine Wo Contrast  Result Date: 08/10/2018 CLINICAL DATA:  Sudden onset of altered mental status. Patient reports headache. EXAM: CT HEAD WITHOUT CONTRAST CT CERVICAL SPINE WITHOUT CONTRAST TECHNIQUE: Multidetector CT imaging of the head and cervical spine was performed following the standard protocol without intravenous contrast. Multiplanar CT image reconstructions of the cervical spine  were also generated. COMPARISON:  07/16/2018 FINDINGS: CT HEAD FINDINGS Brain: No evidence of acute infarction, hemorrhage, hydrocephalus, extra-axial collection or mass lesion/mass effect. Calcification in the left cerebral hemisphere is again noted and appears unchanged. Vascular: No hyperdense vessel or unexpected calcification. Skull: Normal. Negative for fracture or focal lesion. Sinuses/Orbits: No acute finding. Other: None CT CERVICAL SPINE FINDINGS Alignment: Straightening of normal cervical lordosis. Skull base and vertebrae: No acute fracture. No primary bone lesion or focal pathologic process. Soft tissues and spinal canal: No prevertebral fluid or swelling. No visible  canal hematoma. Disc levels: Multi level disc space narrowing and endplate spurring noted. Most advanced at C5-6. Upper chest: Negative. Other: None IMPRESSION: 1. No acute intracranial abnormalities.  Normal brain. 2. No evidence for cervical spine fracture. 3. Cervical degenerative disc disease. Electronically Signed   By: Kerby Moors M.D.   On: 08/10/2018 18:10    Procedures Procedures (including critical care time)  Medications Ordered in ED Medications  acetaminophen (TYLENOL) tablet 1,000 mg (1,000 mg Oral Given 08/10/18 1714)  ondansetron (ZOFRAN-ODT) disintegrating tablet 4 mg (4 mg Oral Given 08/10/18 1714)     Initial Impression / Assessment and Plan / ED Course  I have reviewed the triage vital signs and the nursing notes.  Pertinent labs & imaging results that were available during my care of the patient were reviewed by me and considered in my medical decision making (see chart for details).     46 yo F with a chief complaint of left sided headache and confusion.  This apparently occurred somewhat suddenly this afternoon.  She had been drinking all day with a friend.  I suspect that this is all alcohol intoxication that the patient adamantly denies having that much to drink.  Will obtain a CT of the head  and C-spine EtOH.  CT the head and C-spine are unremarkable.  The patient's alcohol level is elevated.  Patient is now willing to admit that she is been drinking most of the day.  Her pastor is at bedside, he is willing to take custody of her and take her back to where she lives and keep an eye on her.  I will discharge the patient with Librium taper given outpatient resources.   7:51 PM:  I have discussed the diagnosis/risks/treatment options with the patient and family and believe the pt to be eligible for discharge home to follow-up with PCP. We also discussed returning to the ED immediately if new or worsening sx occur. We discussed the sx which are most concerning (e.g., sudden worsening pain, fever, inability to tolerate by mouth) that necessitate immediate return. Medications administered to the patient during their visit and any new prescriptions provided to the patient are listed below.  Medications given during this visit Medications  acetaminophen (TYLENOL) tablet 1,000 mg (1,000 mg Oral Given 08/10/18 1714)  ondansetron (ZOFRAN-ODT) disintegrating tablet 4 mg (4 mg Oral Given 08/10/18 1714)      The patient appears reasonably screen and/or stabilized for discharge and I doubt any other medical condition or other [facility] requiring further screening, evaluation, or treatment in the ED at this time prior to discharge.    Final Clinical Impressions(s) / ED Diagnoses   Final diagnoses:  Headache in back of head  Neck pain  Alcoholic intoxication without complication [facility])    ED Discharge Orders         Ordered    chlordiazePOXIDE (LIBRIUM) 25 MG capsule     08/10/18 1949           Deno Etienne, DO 08/10/18 1951

## 2018-08-20 NOTE — Congregational Nurse Program (Signed)
Client requesting information about CN services and how to obtain bus passes.  Informed client of above.

## 2018-08-22 ENCOUNTER — Observation Stay (HOSPITAL_COMMUNITY)
Admission: EM | Admit: 2018-08-22 | Discharge: 2018-08-25 | Payer: Medicaid Other | Attending: Internal Medicine | Admitting: Internal Medicine

## 2018-08-22 ENCOUNTER — Encounter (HOSPITAL_COMMUNITY): Payer: Self-pay | Admitting: *Deleted

## 2018-08-22 ENCOUNTER — Other Ambulatory Visit: Payer: Self-pay

## 2018-08-22 DIAGNOSIS — B1920 Unspecified viral hepatitis C without hepatic coma: Secondary | ICD-10-CM | POA: Insufficient documentation

## 2018-08-22 DIAGNOSIS — G9341 Metabolic encephalopathy: Secondary | ICD-10-CM | POA: Diagnosis not present

## 2018-08-22 DIAGNOSIS — G40909 Epilepsy, unspecified, not intractable, without status epilepticus: Secondary | ICD-10-CM

## 2018-08-22 DIAGNOSIS — X58XXXA Exposure to other specified factors, initial encounter: Secondary | ICD-10-CM | POA: Diagnosis not present

## 2018-08-22 DIAGNOSIS — F1010 Alcohol abuse, uncomplicated: Secondary | ICD-10-CM | POA: Diagnosis not present

## 2018-08-22 DIAGNOSIS — Z87442 Personal history of urinary calculi: Secondary | ICD-10-CM | POA: Diagnosis not present

## 2018-08-22 DIAGNOSIS — K7460 Unspecified cirrhosis of liver: Secondary | ICD-10-CM | POA: Diagnosis present

## 2018-08-22 DIAGNOSIS — K219 Gastro-esophageal reflux disease without esophagitis: Secondary | ICD-10-CM | POA: Diagnosis present

## 2018-08-22 DIAGNOSIS — F322 Major depressive disorder, single episode, severe without psychotic features: Secondary | ICD-10-CM | POA: Diagnosis present

## 2018-08-22 DIAGNOSIS — D61818 Other pancytopenia: Secondary | ICD-10-CM | POA: Diagnosis not present

## 2018-08-22 DIAGNOSIS — F209 Schizophrenia, unspecified: Secondary | ICD-10-CM | POA: Diagnosis not present

## 2018-08-22 DIAGNOSIS — I959 Hypotension, unspecified: Secondary | ICD-10-CM | POA: Diagnosis present

## 2018-08-22 DIAGNOSIS — K7031 Alcoholic cirrhosis of liver with ascites: Secondary | ICD-10-CM | POA: Diagnosis not present

## 2018-08-22 DIAGNOSIS — F319 Bipolar disorder, unspecified: Secondary | ICD-10-CM | POA: Diagnosis not present

## 2018-08-22 DIAGNOSIS — K7290 Hepatic failure, unspecified without coma: Secondary | ICD-10-CM

## 2018-08-22 DIAGNOSIS — Z79899 Other long term (current) drug therapy: Secondary | ICD-10-CM | POA: Diagnosis not present

## 2018-08-22 DIAGNOSIS — T50901A Poisoning by unspecified drugs, medicaments and biological substances, accidental (unintentional), initial encounter: Secondary | ICD-10-CM

## 2018-08-22 DIAGNOSIS — Z23 Encounter for immunization: Secondary | ICD-10-CM | POA: Diagnosis not present

## 2018-08-22 DIAGNOSIS — R9431 Abnormal electrocardiogram [ECG] [EKG]: Secondary | ICD-10-CM | POA: Diagnosis not present

## 2018-08-22 DIAGNOSIS — T405X1A Poisoning by cocaine, accidental (unintentional), initial encounter: Principal | ICD-10-CM | POA: Insufficient documentation

## 2018-08-22 DIAGNOSIS — T50904A Poisoning by unspecified drugs, medicaments and biological substances, undetermined, initial encounter: Secondary | ICD-10-CM | POA: Diagnosis not present

## 2018-08-22 DIAGNOSIS — F1020 Alcohol dependence, uncomplicated: Secondary | ICD-10-CM | POA: Diagnosis present

## 2018-08-22 DIAGNOSIS — F419 Anxiety disorder, unspecified: Secondary | ICD-10-CM | POA: Diagnosis not present

## 2018-08-22 DIAGNOSIS — D696 Thrombocytopenia, unspecified: Secondary | ICD-10-CM | POA: Diagnosis present

## 2018-08-22 DIAGNOSIS — R569 Unspecified convulsions: Secondary | ICD-10-CM

## 2018-08-22 DIAGNOSIS — R188 Other ascites: Secondary | ICD-10-CM | POA: Diagnosis present

## 2018-08-22 HISTORY — DX: Poisoning by unspecified drugs, medicaments and biological substances, accidental (unintentional), initial encounter: T50.901A

## 2018-08-22 HISTORY — DX: Metabolic encephalopathy: G93.41

## 2018-08-22 LAB — CBC WITH DIFFERENTIAL/PLATELET
Abs Immature Granulocytes: 0.01 10*3/uL (ref 0.00–0.07)
Basophils Absolute: 0 10*3/uL (ref 0.0–0.1)
Basophils Relative: 1 %
Eosinophils Absolute: 0.1 10*3/uL (ref 0.0–0.5)
Eosinophils Relative: 5 %
HCT: 41.9 % (ref 36.0–46.0)
Hemoglobin: 13 g/dL (ref 12.0–15.0)
Immature Granulocytes: 0 %
Lymphocytes Relative: 39 %
Lymphs Abs: 1 10*3/uL (ref 0.7–4.0)
MCH: 29.4 pg (ref 26.0–34.0)
MCHC: 31 g/dL (ref 30.0–36.0)
MCV: 94.8 fL (ref 80.0–100.0)
Monocytes Absolute: 0.4 10*3/uL (ref 0.1–1.0)
Monocytes Relative: 13 %
Neutro Abs: 1.1 10*3/uL — ABNORMAL LOW (ref 1.7–7.7)
Neutrophils Relative %: 42 %
Platelets: 34 10*3/uL — ABNORMAL LOW (ref 150–400)
RBC: 4.42 MIL/uL (ref 3.87–5.11)
RDW: 20.2 % — ABNORMAL HIGH (ref 11.5–15.5)
WBC: 2.6 10*3/uL — ABNORMAL LOW (ref 4.0–10.5)
nRBC: 0 % (ref 0.0–0.2)

## 2018-08-22 LAB — BLOOD GAS, VENOUS
Acid-base deficit: 4.6 mmol/L — ABNORMAL HIGH (ref 0.0–2.0)
Bicarbonate: 20.8 mmol/L (ref 20.0–28.0)
O2 Saturation: 75.9 %
Patient temperature: 98.6
pCO2, Ven: 42.1 mmHg — ABNORMAL LOW (ref 44.0–60.0)
pH, Ven: 7.315 (ref 7.250–7.430)
pO2, Ven: 48.9 mmHg — ABNORMAL HIGH (ref 32.0–45.0)

## 2018-08-22 LAB — COMPREHENSIVE METABOLIC PANEL
ALT: 33 U/L (ref 0–44)
AST: 54 U/L — ABNORMAL HIGH (ref 15–41)
Albumin: 2.8 g/dL — ABNORMAL LOW (ref 3.5–5.0)
Alkaline Phosphatase: 115 U/L (ref 38–126)
Anion gap: 4 — ABNORMAL LOW (ref 5–15)
BUN: 5 mg/dL — ABNORMAL LOW (ref 6–20)
CO2: 22 mmol/L (ref 22–32)
Calcium: 8.2 mg/dL — ABNORMAL LOW (ref 8.9–10.3)
Chloride: 116 mmol/L — ABNORMAL HIGH (ref 98–111)
Creatinine, Ser: 0.66 mg/dL (ref 0.44–1.00)
GFR calc Af Amer: >60 mL/min (ref >60)
GFR calc non Af Amer: >60 mL/min (ref >60)
Glucose, Bld: 83 mg/dL (ref 70–99)
Potassium: 3.7 mmol/L (ref 3.5–5.1)
Sodium: 142 mmol/L (ref 135–145)
Total Bilirubin: 0.6 mg/dL (ref 0.3–1.2)
Total Protein: 6.7 g/dL (ref 6.5–8.1)

## 2018-08-22 LAB — I-STAT CHEM 8, ED
BUN: <3 mg/dL — ABNORMAL LOW (ref 6–20)
Calcium, Ion: 1.14 mmol/L — ABNORMAL LOW (ref 1.15–1.40)
Chloride: 114 mmol/L — ABNORMAL HIGH (ref 98–111)
Creatinine, Ser: 0.9 mg/dL (ref 0.44–1.00)
Glucose, Bld: 83 mg/dL (ref 70–99)
HCT: 35 % — ABNORMAL LOW (ref 36.0–46.0)
Hemoglobin: 11.9 g/dL — ABNORMAL LOW (ref 12.0–15.0)
Potassium: 3.9 mmol/L (ref 3.5–5.1)
Sodium: 144 mmol/L (ref 135–145)
TCO2: 21 mmol/L — ABNORMAL LOW (ref 22–32)

## 2018-08-22 LAB — RAPID URINE DRUG SCREEN, HOSP PERFORMED
Amphetamines: NOT DETECTED
Barbiturates: NOT DETECTED
Benzodiazepines: POSITIVE — AB
Cocaine: POSITIVE — AB
Opiates: NOT DETECTED
Tetrahydrocannabinol: NOT DETECTED

## 2018-08-22 LAB — URINALYSIS, ROUTINE W REFLEX MICROSCOPIC
Bilirubin Urine: NEGATIVE
Glucose, UA: NEGATIVE mg/dL
Hgb urine dipstick: NEGATIVE
Ketones, ur: NEGATIVE mg/dL
Leukocytes, UA: NEGATIVE
Nitrite: NEGATIVE
Protein, ur: NEGATIVE mg/dL
Specific Gravity, Urine: 1.002 — ABNORMAL LOW (ref 1.005–1.030)
pH: 6 (ref 5.0–8.0)

## 2018-08-22 LAB — SALICYLATE LEVEL: Salicylate Lvl: <7 mg/dL (ref 2.8–30.0)

## 2018-08-22 LAB — PROTIME-INR
INR: 1.08
Prothrombin Time: 13.9 seconds (ref 11.4–15.2)

## 2018-08-22 LAB — PREGNANCY, URINE: Preg Test, Ur: NEGATIVE

## 2018-08-22 LAB — ACETAMINOPHEN LEVEL
Acetaminophen (Tylenol), Serum: <10 ug/mL — ABNORMAL LOW (ref 10–30)
Acetaminophen (Tylenol), Serum: <10 ug/mL — ABNORMAL LOW (ref 10–30)

## 2018-08-22 LAB — MAGNESIUM: Magnesium: 1.7 mg/dL (ref 1.7–2.4)

## 2018-08-22 LAB — AMMONIA: Ammonia: 22 umol/L (ref 9–35)

## 2018-08-22 LAB — ETHANOL: Alcohol, Ethyl (B): 218 mg/dL — ABNORMAL HIGH (ref <10)

## 2018-08-22 MED ORDER — LORAZEPAM 2 MG/ML IJ SOLN
1.0000 mg | INTRAMUSCULAR | Status: DC | PRN
  Filled 2018-08-22: qty 1

## 2018-08-22 MED ORDER — THIAMINE HCL 100 MG/ML IJ SOLN: 100.0000 mg | Freq: Every day | INTRAMUSCULAR | Status: DC

## 2018-08-22 MED ORDER — MAGNESIUM SULFATE IN D5W 1-5 GM/100ML-% IV SOLN
1.0000 g | Freq: Once | INTRAVENOUS | Status: AC
  Administered 2018-08-22: 1 g via INTRAVENOUS
  Filled 2018-08-22: qty 100

## 2018-08-22 MED ORDER — ADULT MULTIVITAMIN W/MINERALS CH
1.0000 | ORAL_TABLET | Freq: Every day | ORAL | Status: DC
  Administered 2018-08-23 – 2018-08-25 (×3): 1 via ORAL
  Filled 2018-08-22 (×3): qty 1

## 2018-08-22 MED ORDER — NOREPINEPHRINE 4 MG/250ML-% IV SOLN
0.0000 ug/min | INTRAVENOUS | Status: DC
  Administered 2018-08-22: 2 ug/min via INTRAVENOUS
  Filled 2018-08-22: qty 250

## 2018-08-22 MED ORDER — ONDANSETRON HCL 4 MG/2ML IJ SOLN
4.0000 mg | Freq: Four times a day (QID) | INTRAMUSCULAR | Status: DC | PRN
  Administered 2018-08-24: 4 mg via INTRAVENOUS
  Filled 2018-08-22: qty 2

## 2018-08-22 MED ORDER — SODIUM CHLORIDE 0.9 % IV SOLN
INTRAVENOUS | Status: DC
  Administered 2018-08-22: 22:00:00 via INTRAVENOUS

## 2018-08-22 MED ORDER — SODIUM CHLORIDE 0.9 % IV BOLUS
1000.0000 mL | Freq: Once | INTRAVENOUS | Status: AC
  Administered 2018-08-22: 1000 mL via INTRAVENOUS

## 2018-08-22 MED ORDER — LORAZEPAM 2 MG/ML IJ SOLN: 1.0000 mg | Freq: Four times a day (QID) | INTRAMUSCULAR | Status: DC | PRN

## 2018-08-22 MED ORDER — LORAZEPAM 1 MG PO TABS: 1.0000 mg | ORAL_TABLET | Freq: Four times a day (QID) | ORAL | Status: DC | PRN

## 2018-08-22 MED ORDER — LACTATED RINGERS IV BOLUS
1000.0000 mL | Freq: Once | INTRAVENOUS | Status: AC
  Administered 2018-08-22: 1000 mL via INTRAVENOUS

## 2018-08-22 MED ORDER — POTASSIUM CHLORIDE 20 MEQ/15ML (10%) PO SOLN
40.0000 meq | Freq: Once | ORAL | Status: AC
  Administered 2018-08-22: 40 meq via ORAL
  Filled 2018-08-22: qty 30

## 2018-08-22 MED ORDER — TOPIRAMATE 25 MG PO TABS
50.0000 mg | ORAL_TABLET | Freq: Every day | ORAL | Status: DC
  Administered 2018-08-23 – 2018-08-25 (×3): 50 mg via ORAL
  Filled 2018-08-22 (×3): qty 2

## 2018-08-22 MED ORDER — LORAZEPAM 2 MG/ML IJ SOLN: 0.0000 mg | Freq: Two times a day (BID) | INTRAMUSCULAR | Status: DC

## 2018-08-22 MED ORDER — PANTOPRAZOLE SODIUM 40 MG IV SOLR
40.0000 mg | Freq: Two times a day (BID) | INTRAVENOUS | Status: DC
  Administered 2018-08-22 – 2018-08-25 (×6): 40 mg via INTRAVENOUS
  Filled 2018-08-22 (×6): qty 40

## 2018-08-22 MED ORDER — LACTULOSE 10 GM/15ML PO SOLN
20.0000 g | Freq: Every day | ORAL | Status: DC
  Administered 2018-08-23 – 2018-08-25 (×3): 20 g via ORAL
  Filled 2018-08-22 (×4): qty 30

## 2018-08-22 MED ORDER — FOLIC ACID 1 MG PO TABS
1.0000 mg | ORAL_TABLET | Freq: Every day | ORAL | Status: DC
  Administered 2018-08-23 – 2018-08-25 (×3): 1 mg via ORAL
  Filled 2018-08-22 (×3): qty 1

## 2018-08-22 MED ORDER — VITAMIN B-1 100 MG PO TABS
100.0000 mg | ORAL_TABLET | Freq: Every day | ORAL | Status: DC
  Administered 2018-08-23 – 2018-08-25 (×3): 100 mg via ORAL
  Filled 2018-08-22 (×3): qty 1

## 2018-08-22 MED ORDER — LORAZEPAM 2 MG/ML IJ SOLN: 0.0000 mg | Freq: Four times a day (QID) | INTRAMUSCULAR | Status: DC

## 2018-08-22 MED ORDER — ONDANSETRON HCL 4 MG PO TABS: 4.0000 mg | ORAL_TABLET | Freq: Four times a day (QID) | ORAL | Status: DC | PRN

## 2018-08-22 MED ORDER — INFLUENZA VAC SPLIT QUAD 0.5 ML IM SUSY
0.5000 mL | PREFILLED_SYRINGE | INTRAMUSCULAR | Status: AC
  Administered 2018-08-24: 0.5 mL via INTRAMUSCULAR
  Filled 2018-08-22: qty 0.5

## 2018-08-22 NOTE — ED Notes (Signed)
RN notified MD Ayesha Rumpf of decreasing BP and MAP with Fluid bolus almost complete.

## 2018-08-22 NOTE — ED Notes (Signed)
Spoke to poison control, recommends checking Dilantin levels and if QTC gets over 500 to replace Potassium and Magnesium then repeat labs and EKG. Continue to repeat EKGs every 4-6 hours.

## 2018-08-22 NOTE — ED Provider Notes (Signed)
[facility] DEPT Provider Note   CSN: [id number] Arrival date & time: 08/22/18  1614      History   Chief Complaint Chief Complaint  Patient presents with  . Ingestion    HPI Briana Sweeney is a 47 y.o. female.  The history is provided by the patient, the EMS personnel and medical records. No language interpreter was used.  Ingestion    Briana Sweeney is a 47 y.o. female who presents to the Emergency Department complaining of ingestion. Level V caveat due to altered mental status. Per EMS report patient overdosed on alcohol, Xanax and Haldol. On ED arrival patient is unable to state what she took but does list off Xanax, gabapentin, Topamax followed by mumbling speech. Unclear if this was an attempt at self harm. Past Medical History:  Diagnosis Date  . Anxiety   . Bipolar affective disorder ([place])    With anxiety features  . 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 Depression   . GERD (gastroesophageal reflux disease)   . Hematemesis 02/10/2018  . Hepatitis C 2018   hepatitis c and alcohol related hepatitis  . History of blood transfusion    "blood doesn't clot; I fell down and had to have a transfusion"  . History of kidney stones   . Migraine    "when I get really stressed" (09/01/2017)  . Schizophrenia ([place])   . Seizures ([place])    "when I run out of my RX; lots recently" (09/01/2017)    Patient Active Problem List   Diagnosis Date Noted  . Acute metabolic encephalopathy 56/81/2751  . Overdose 08/22/2018  . Hypotension 08/22/2018  . Seizure ([place]) 08/22/2018  . Upper GI bleed 02/10/2018  . Alcohol withdrawal, with unspecified complication ([place]) 70/09/7492  . Chronic anemia   . Thrombocytopenia due to drugs   . Suicidal ideation   . Thrombocytopenia ([place]) 01/02/2018  . GERD (gastroesophageal reflux disease) 11/01/2017  . Trichimoniasis 10/30/2017  . Alcohol dependence ([place]) 10/29/2017  . MDD (major depressive  disorder), severe ([place]) 10/28/2017  . Acute hyperactive alcohol withdrawal delirium ([place]) 10/09/2017  . Schizophrenia ([place]) 10/09/2017  . Alcohol abuse with alcohol-induced mood disorder ([place])   . Alcohol withdrawal syndrome with complication ([place]) 49/67/5916  . 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 Hematemesis 09/01/2017  . Ascites due to alcoholic cirrhosis ([place])   . Decompensated hepatic cirrhosis ([place]) 07/23/2017  . Alcohol abuse 07/23/2017  . Hepatitis C antibody positive in blood 07/23/2017  . Hypokalemia 07/23/2017  . Jaundice 07/23/2017  . Coagulopathy ([place]) 07/23/2017  . Hypomagnesemia 07/23/2017  . Pancytopenia ([place]) 07/23/2017  . Alcoholic cirrhosis of liver with ascites ([place]) 07/23/2017    Past Surgical History:  Procedure Laterality Date  . ESOPHAGOGASTRODUODENOSCOPY N/A 09/03/2017   Procedure: ESOPHAGOGASTRODUODENOSCOPY (EGD);  Surgeon: Doran Stabler, MD;  Location: [facility];  Service: Gastroenterology;  Laterality: N/A;  . FINGER FRACTURE SURGERY Left    "shattered my pinky"  . FRACTURE SURGERY    . IR PARACENTESIS  07/23/2017  . IR PARACENTESIS  07/2017   "did it twice in the same week" (09/01/2017)  . SHOULDER OPEN ROTATOR CUFF REPAIR Right   . TUBAL LIGATION    . VAGINAL HYSTERECTOMY       OB History   No obstetric history on file.      Home Medications    Prior to Admission medications   Medication Sig Start Date End Date Taking?  Authorizing Provider  naproxen sodium (ALEVE) 220 MG tablet Take 220 mg by mouth daily as needed (pain).   Yes [provider]  busPIRone (BUSPAR) 10 MG tablet Take 1 tablet (10 mg total) by mouth 2 (two) times daily. 06/04/18   [name], FNP  chlordiazePOXIDE (LIBRIUM) 25 MG capsule 50mg  PO TID x 1D, then 25-50mg  PO BID X 1D, then 25-50mg  PO QD X 1D 08/10/18   Deno Etienne, DO  FLUoxetine (PROZAC) 20 MG tablet Take 20 mg by mouth daily. 08/05/18   [provider]  folic acid (FOLVITE)  1 MG tablet Take 1 tablet (1 mg total) by mouth daily. Patient not taking: Reported on 08/22/2018 02/15/18   Phillips Grout, MD  furosemide (LASIX) 20 MG tablet Take 1 tablet (20 mg total) by mouth daily. 06/04/18   [name], FNP  gabapentin (NEURONTIN) 100 MG capsule Take 2 capsules (200 mg total) by mouth 3 (three) times daily. 07/08/18   [name], FNP  haloperidol (HALDOL) 2 MG tablet Take 1 tablet (2 mg total) by mouth at bedtime. 02/20/18   [name], FNP  hydrOXYzine (ATARAX/VISTARIL) 25 MG tablet Take 1 tablet (25 mg total) by mouth 3 (three) times daily as needed for anxiety. 07/22/18   [name], FNP  lactulose (CHRONULAC) 10 GM/15ML solution Take 30 mLs (20 g total) by mouth daily. Patient not taking: Reported on 08/22/2018 07/22/18   [name], FNP  Multiple Vitamin (MULTIVITAMIN WITH MINERALS) TABS tablet Take 1 tablet by mouth daily. Patient not taking: Reported on 07/22/2018 01/09/18   Eugenie Filler, MD  pantoprazole (PROTONIX) 40 MG tablet Take 1 tablet (40 mg total) by mouth daily with breakfast. Patient not taking: Reported on 08/22/2018 02/15/18   Phillips Grout, MD  potassium chloride SA (K-DUR,KLOR-CON) 20 MEQ tablet Take 1 tablet (20 mEq total) by mouth 2 (two) times daily. Patient not taking: Reported on 08/22/2018 05/24/18   Rolland Porter, MD  propranolol (INDERAL) 20 MG tablet Take 1 tablet (20 mg total) by mouth 2 (two) times daily. 07/08/18   [name], FNP  spironolactone (ALDACTONE) 50 MG tablet Take 1 tablet (50 mg total) by mouth daily. 06/04/18   [name], FNP  topiramate (TOPAMAX) 50 MG tablet Take 1 tablet (50 mg total) by mouth daily. 06/04/18   [name], FNP  traZODone (DESYREL) 50 MG tablet Take 50 mg by mouth at bedtime as needed for sleep. 08/05/18   [provider]    Family History Family History  Problem Relation Age of Onset  . Lung cancer Mother 6  . Alcohol abuse Mother   .  Throat cancer Father 78    Social History Social History   Tobacco Use  . Smoking status: Never Smoker  . Smokeless tobacco: Never Used  Substance Use Topics  . Alcohol use: Yes    Alcohol/week: 63.0 standard drinks    Types: 63 Cans of beer per week    Comment: weekly "I have cut back"  . Drug use: No     Allergies   Patient has no known allergies.   Review of Systems Review of Systems  Unable to perform ROS: Mental status change     Physical Exam Updated Vital Signs BP 100/66 (BP Location: Right Arm)   Pulse 74   Temp (!) 97.5 F (36.4 C) (Oral)   Resp 17   Ht 4\' 11"  (1.499 m)   Wt 63.5  kg   SpO2 99%   BMI 28.27 kg/m   Physical Exam Vitals signs and nursing note reviewed.  Constitutional:      Appearance: She is well-developed.  HENT:     Head: Normocephalic and atraumatic.     Comments: Pupils midsized and reactive bilaterally Cardiovascular:     Rate and Rhythm: Normal rate and regular rhythm.     Heart sounds: No murmur.  Pulmonary:     Effort: Pulmonary effort is normal. No respiratory distress.     Breath sounds: Normal breath sounds.  Abdominal:     Palpations: Abdomen is soft.     Tenderness: There is no abdominal tenderness. There is no guarding or rebound.  Musculoskeletal:        General: No tenderness.  Skin:    General: Skin is warm and dry.  Neurological:     Mental Status: She is alert.     Comments: Lethargic. Dysarthritic speech. Generalized weakness.  Psychiatric:     Comments: Unable to assess      ED Treatments / Results  Labs (all labs ordered are listed, but only abnormal results are displayed) Labs Reviewed  COMPREHENSIVE METABOLIC PANEL - Abnormal; Notable for the following components:      Result Value   Chloride 116 (*)    BUN 5 (*)    Calcium 8.2 (*)    Albumin 2.8 (*)    AST 54 (*)    Anion gap 4 (*)    All other components within normal limits  CBC WITH DIFFERENTIAL/PLATELET - Abnormal; Notable for the  following components:   WBC 2.6 (*)    RDW 20.2 (*)    Platelets 34 (*)    Neutro Abs 1.1 (*)    All other components within normal limits  URINALYSIS, ROUTINE W REFLEX MICROSCOPIC - Abnormal; Notable for the following components:   Color, Urine COLORLESS (*)    Specific Gravity, Urine 1.002 (*)    All other components within normal limits  RAPID URINE DRUG SCREEN, HOSP PERFORMED - Abnormal; Notable for the following components:   Cocaine POSITIVE (*)    Benzodiazepines POSITIVE (*)    All other components within normal limits  ACETAMINOPHEN LEVEL - Abnormal; Notable for the following components:   Acetaminophen (Tylenol), Serum <10 (*)    All other components within normal limits  ETHANOL - Abnormal; Notable for the following components:   Alcohol, Ethyl (B) 218 (*)    All other components within normal limits  BLOOD GAS, VENOUS - Abnormal; Notable for the following components:   pCO2, Ven 42.1 (*)    pO2, Ven 48.9 (*)    Acid-base deficit 4.6 (*)    All other components within normal limits  ACETAMINOPHEN LEVEL - Abnormal; Notable for the following components:   Acetaminophen (Tylenol), Serum <10 (*)    All other components within normal limits  I-STAT CHEM 8, ED - Abnormal; Notable for the following components:   Chloride 114 (*)    BUN <3 (*)    Calcium, Ion 1.14 (*)    TCO2 21 (*)    Hemoglobin 11.9 (*)    HCT 35.0 (*)    All other components within normal limits  MRSA PCR SCREENING  SALICYLATE LEVEL  PROTIME-INR  MAGNESIUM  AMMONIA  PREGNANCY, URINE  PHENYTOIN LEVEL, FREE AND TOTAL  BASIC METABOLIC PANEL  CBC  TOPIRAMATE LEVEL    EKG EKG Interpretation  Date/Time:  Saturday August 22 2018 20:06:05 EST Ventricular Rate:  68 PR Interval:    QRS Duration: 71 QT Interval:  444 QTC Calculation: 473 R Axis:   63 Text Interpretation:  Sinus rhythm Low voltage, precordial leads Confirmed by Quintella Reichert [phone]) on 08/22/2018 8:11:22 PM   Radiology No  results found.  Procedures Procedures (including critical care time) Angiocath insertion Performed by: Quintella Reichert  Consent: Verbal consent obtained. Risks and benefits: risks, benefits and alternatives were discussed Time out: Immediately prior to procedure a "time out" was called to verify the correct patient, procedure, equipment, support staff and site/side marked as required.  Preparation: Patient was prepped and draped in the usual sterile fashion.  Vein Location: left basilic  Ultrasound Guided  Gauge: 18  Normal blood return and flush without difficulty Patient tolerance: Patient tolerated the procedure well with no immediate complications.   CRITICAL CARE Performed by: Quintella Reichert   Total critical care time: 40 minutes  Critical care time was exclusive of separately billable procedures and treating other patients.  Critical care was necessary to treat or prevent imminent or life-threatening deterioration.  Critical care was time spent personally by me on the following activities: development of treatment plan with patient and/or surrogate as well as nursing, discussions with consultants, evaluation of patient's response to treatment, examination of patient, obtaining history from patient or surrogate, ordering and performing treatments and interventions, ordering and review of laboratory studies, ordering and review of radiographic studies, pulse oximetry and re-evaluation of patient's condition.  Medications Ordered in ED Medications  LORazepam (ATIVAN) injection 1 mg (has no administration in time range)  pantoprazole (PROTONIX) injection 40 mg (40 mg Intravenous Given 08/22/18 2137)  0.9 %  sodium chloride infusion ( Intravenous New Bag/Given 08/22/18 2145)  LORazepam (ATIVAN) tablet 1 mg (has no administration in time range)    Or  LORazepam (ATIVAN) injection 1 mg (has no administration in time range)  thiamine (VITAMIN B-1) tablet 100 mg (100 mg Oral  Not Given 08/23/18 0023)    Or  thiamine (B-1) injection 100 mg ( Intravenous See Alternative 29/52/84 1324)  folic acid (FOLVITE) tablet 1 mg (1 mg Oral Not Given 08/23/18 0020)  multivitamin with minerals tablet 1 tablet (1 tablet Oral Not Given 08/23/18 0021)  LORazepam (ATIVAN) injection 0-4 mg (0 mg Intravenous Not Given 08/22/18 2030)    Followed by  LORazepam (ATIVAN) injection 0-4 mg (has no administration in time range)  ondansetron (ZOFRAN) tablet 4 mg (has no administration in time range)    Or  ondansetron (ZOFRAN) injection 4 mg (has no administration in time range)  lactulose (CHRONULAC) 10 GM/15ML solution 20 g (20 g Oral Not Given 08/23/18 0021)  topiramate (TOPAMAX) tablet 50 mg (50 mg Oral Not Given 08/23/18 0022)  Influenza vac split quadrivalent PF (FLUARIX) injection 0.5 mL (has no administration in time range)  sodium chloride 0.9 % bolus 1,000 mL (0 mLs Intravenous Stopped 08/22/18 1818)  lactated ringers bolus 1,000 mL (0 mLs Intravenous Stopped 08/22/18 2137)  magnesium sulfate IVPB 1 g 100 mL (1 g Intravenous New Bag/Given 08/22/18 2144)  potassium chloride 20 MEQ/15ML (10%) solution 40 mEq (40 mEq Oral Given 08/22/18 2145)     Initial Impression / Assessment and Plan / ED Course  I have reviewed the triage vital signs and the nursing notes.  Pertinent labs & imaging results that were available during my care of the patient were reviewed by me and considered in my medical decision making (see chart for details).     Patient with  history of cirrhosis and alcohol abuse here for evaluation following overdose of unclear intent. Patient is lethargic but arouses to verbal stimuli on ED arrival. It is unclear what she took. Case was discussed with poison control. She was treated with IV fluid hydration and developed hypotension. She did require levophed for hypotension to the 70s. After about one hour the levophed was able to be weaned off. Her mental status did improve  during her ED stay. Hospitalist consulted for admission. Final Clinical Impressions(s) / ED Diagnoses   Final diagnoses:  None    ED Discharge Orders    None       Quintella Reichert, MD 08/23/18 0120

## 2018-08-22 NOTE — ED Triage Notes (Signed)
EMS reports approx 1 hour ago ingestion unknown amount of Haldol and Xanax followed by ETOH. Slightly lethargic,IV established, 92/50 92-94% RA CBG 133

## 2018-08-22 NOTE — H&P (Signed)
History and Physical    Briana Sweeney JSE:[id number] DOB: [birth year] DOA: 08/22/2018  Referring MD/NP/PA:   PCP: [name], FNP   Patient coming from:  The patient is coming from home.  At baseline, pt is independent for most of ADL.        Chief Complaint: overdose  HPI: Briana Sweeney is a 47 y.o. female with medical history significant of polysubstance abuse, alcohol abuse, cocaine abuse, liver cirrhosis, ascites, seizure, HCV, esophageal varices, GI bleeding, GERD, depression, anxiety, who presents with overdose.  Patient has AMS, and is unable to provide accurate medical history, therefore, most of the history is obtained by discussing the case with ED physician, per EMS report, and with the nursing staff.   Per EMS report, pt likely ingested unknown amount of Haldol and Xanax followed by drinking ETOH. Pt is very lethargic and confused. Her Bp was initially 77/57. She was given 1L of NS and 1L of ringer solution, and treated shorted with IV Levophed. Her Bp improved to SBP~100s. Her mental status improved slightly. When I saw pt in ED, she is still contused.  She is orientated to place, not to person or time.  She complains of esophageal hurting. No active nausea, vomiting or diarrhea noted.  Her abdomen is mildly distended, but does not seem to have tenderness.  No active cough, respiratory distress, shortness of breath noted.  No facial droop or slurred speech.  She moves all extremities.  ED Course: pt was found to have pancytopenia (WBC 2.6, hemoglobin 11.9, platelets 34), INR 1.08, UDS positive for cocaine and benzo, Tylenol level less than 10, salicylate level less than 7, negative urinalysis, alcohol level 118, electrolytes renal function okay, temperature normal, currently no tachycardia, oxygen saturation 93% on room air. Pt is placed on stepdown bed for observation.  Poison control contacted by ED.  Review of Systems: Cannot be reviewed and accurately due to altered mental  status.   Allergy: No Known Allergies  Past Medical History:  Diagnosis Date  . Anxiety   . Bipolar affective disorder ([place])    With anxiety features  . Cirrhosis of liver ([place])    Due to alcohol and hepatitis C  . Depression   . GERD (gastroesophageal reflux disease)   . Hematemesis 02/10/2018  . Hepatitis C 2018   hepatitis c and alcohol related hepatitis  . History of blood transfusion    "blood doesn't clot; I fell down and had to have a transfusion"  . History of kidney stones   . Migraine    "when I get really stressed" (09/01/2017)  . Schizophrenia ([place])   . Seizures ([place])    "when I run out of my RX; lots recently" (09/01/2017)    Past Surgical History:  Procedure Laterality Date  . ESOPHAGOGASTRODUODENOSCOPY N/A 09/03/2017   Procedure: ESOPHAGOGASTRODUODENOSCOPY (EGD);  Surgeon: Doran Stabler, MD;  Location: [facility];  Service: Gastroenterology;  Laterality: N/A;  . FINGER FRACTURE SURGERY Left    "shattered my pinky"  . FRACTURE SURGERY    . IR PARACENTESIS  07/23/2017  . IR PARACENTESIS  07/2017   "did it twice in the same week" (09/01/2017)  . SHOULDER OPEN ROTATOR CUFF REPAIR Right   . TUBAL LIGATION    . VAGINAL HYSTERECTOMY      Social History:  reports that she has never smoked. She has never used smokeless tobacco. She reports current alcohol use of about 63.0 standard drinks of alcohol per week. She reports that she  does not use drugs.  Family History:  Family History  Problem Relation Age of Onset  . Lung cancer Mother 56  . Alcohol abuse Mother   . Throat cancer Father 41     Prior to Admission medications   Medication Sig Start Date End Date Taking? Authorizing Provider  naproxen sodium (ALEVE) 220 MG tablet Take 220 mg by mouth daily as needed (pain).   Yes [provider]  busPIRone (BUSPAR) 10 MG tablet Take 1 tablet (10 mg total) by mouth 2 (two) times daily. 06/04/18   [name], FNP  chlordiazePOXIDE (LIBRIUM)  25 MG capsule 50mg  PO TID x 1D, then 25-50mg  PO BID X 1D, then 25-50mg  PO QD X 1D 08/10/18   Deno Etienne, DO  FLUoxetine (PROZAC) 20 MG tablet Take 20 mg by mouth daily. 08/05/18   [provider]  folic acid (FOLVITE) 1 MG tablet Take 1 tablet (1 mg total) by mouth daily. Patient not taking: Reported on 08/22/2018 02/15/18   Phillips Grout, MD  furosemide (LASIX) 20 MG tablet Take 1 tablet (20 mg total) by mouth daily. 06/04/18   [name], FNP  gabapentin (NEURONTIN) 100 MG capsule Take 2 capsules (200 mg total) by mouth 3 (three) times daily. 07/08/18   [name], FNP  haloperidol (HALDOL) 2 MG tablet Take 1 tablet (2 mg total) by mouth at bedtime. 02/20/18   [name], FNP  hydrOXYzine (ATARAX/VISTARIL) 25 MG tablet Take 1 tablet (25 mg total) by mouth 3 (three) times daily as needed for anxiety. 07/22/18   [name], FNP  lactulose (CHRONULAC) 10 GM/15ML solution Take 30 mLs (20 g total) by mouth daily. Patient not taking: Reported on 08/22/2018 07/22/18   [name], FNP  Multiple Vitamin (MULTIVITAMIN WITH MINERALS) TABS tablet Take 1 tablet by mouth daily. Patient not taking: Reported on 07/22/2018 01/09/18   Eugenie Filler, MD  pantoprazole (PROTONIX) 40 MG tablet Take 1 tablet (40 mg total) by mouth daily with breakfast. Patient not taking: Reported on 08/22/2018 02/15/18   Phillips Grout, MD  potassium chloride SA (K-DUR,KLOR-CON) 20 MEQ tablet Take 1 tablet (20 mEq total) by mouth 2 (two) times daily. Patient not taking: Reported on 08/22/2018 05/24/18   Rolland Porter, MD  propranolol (INDERAL) 20 MG tablet Take 1 tablet (20 mg total) by mouth 2 (two) times daily. 07/08/18   [name], FNP  spironolactone (ALDACTONE) 50 MG tablet Take 1 tablet (50 mg total) by mouth daily. 06/04/18   [name], FNP  topiramate (TOPAMAX) 50 MG tablet Take 1 tablet (50 mg total) by mouth daily. 06/04/18   [name], FNP  traZODone (DESYREL) 50  MG tablet Take 50 mg by mouth at bedtime as needed for sleep. 08/05/18   [provider]    Physical Exam: Vitals:   08/22/18 1946 08/22/18 2000 08/22/18 2010 08/22/18 2040  BP: (!) 156/86 104/75 100/71 105/82  Pulse: 72  70 69  Resp: 15 14 14 15   Temp:      TempSrc:      SpO2: 91%  100% 98%  Weight:      Height:       General: Not in acute distress HEENT:       Eyes: PERRL, EOMI, no scleral icterus.       ENT: No discharge from the ears and nose, no pharynx injection, no tonsillar enlargement.        Neck: No  JVD, no bruit, no mass felt. Heme: No neck lymph node enlargement. Cardiac: S1/S2, RRR, No murmurs, No gallops or rubs. Respiratory:  No rales, wheezing, rhonchi or rubs. GI: Soft, mildly distended, nontender, no rebound pain, no organomegaly, BS present. GU: No hematuria Ext: trace leg edema bilaterally. 2+DP/PT pulse bilaterally. Musculoskeletal: No joint deformities, No joint redness or warmth, no limitation of ROM in spin. Skin: No rashes.  Neuro: confused, lethargic, arousable, orientated to place, but not to time and person, cranial nerves II-XII grossly intact, moves all extremities normally.  Psych: Patient is not psychotic, no suicidal or hemocidal ideation.  Labs on Admission: I have personally reviewed following labs and imaging studies  CBC: Recent Labs  Lab 08/22/18 1708 08/22/18 1720  WBC 2.6*  --   NEUTROABS 1.1*  --   HGB 13.0 11.9*  HCT 41.9 35.0*  MCV 94.8  --   PLT 34*  --    Basic Metabolic Panel: Recent Labs  Lab 08/22/18 1708 08/22/18 1720  NA 142 144  K 3.7 3.9  CL 116* 114*  CO2 22  --   GLUCOSE 83 83  BUN 5* <3*  CREATININE 0.66 0.90  CALCIUM 8.2*  --   MG 1.7  --    GFR: Estimated Creatinine Clearance: 63.3 mL/min (by C-G formula based on SCr of 0.9 mg/dL). Liver Function Tests: Recent Labs  Lab 08/22/18 1708  AST 54*  ALT 33  ALKPHOS 115  BILITOT 0.6  PROT 6.7  ALBUMIN 2.8*   No results for input(s):  LIPASE, AMYLASE in the last 168 hours. No results for input(s): AMMONIA in the last 168 hours. Coagulation Profile: Recent Labs  Lab 08/22/18 1708  INR 1.08   Cardiac Enzymes: No results for input(s): CKTOTAL, CKMB, CKMBINDEX, TROPONINI in the last 168 hours. BNP (last 3 results) No results for input(s): PROBNP in the last 8760 hours. HbA1C: No results for input(s): HGBA1C in the last 72 hours. CBG: No results for input(s): GLUCAP in the last 168 hours. Lipid Profile: No results for input(s): CHOL, HDL, LDLCALC, TRIG, CHOLHDL, LDLDIRECT in the last 72 hours. Thyroid Function Tests: No results for input(s): TSH, T4TOTAL, FREET4, T3FREE, THYROIDAB in the last 72 hours. Anemia Panel: No results for input(s): VITAMINB12, FOLATE, FERRITIN, TIBC, IRON, RETICCTPCT in the last 72 hours. Urine analysis:    Component Value Date/Time   COLORURINE COLORLESS (A) 08/22/2018 1708   APPEARANCEUR CLEAR 08/22/2018 1708   LABSPEC 1.002 (L) 08/22/2018 1708   PHURINE 6.0 08/22/2018 1708   GLUCOSEU NEGATIVE 08/22/2018 1708   HGBUR NEGATIVE 08/22/2018 1708   BILIRUBINUR NEGATIVE 08/22/2018 1708   BILIRUBINUR negative 07/22/2018 1209   BILIRUBINUR neg 02/20/2018 1248   KETONESUR NEGATIVE 08/22/2018 1708   PROTEINUR NEGATIVE 08/22/2018 1708   UROBILINOGEN 0.2 07/22/2018 1209   UROBILINOGEN 0.2 12/02/2017 0836   NITRITE NEGATIVE 08/22/2018 1708   LEUKOCYTESUR NEGATIVE 08/22/2018 1708   Sepsis Labs: @LABRCNTIP (procalcitonin:4,lacticidven:4) )No results found for this or any previous visit (from the past 240 hour(s)).   Radiological Exams on Admission: No results found.   EKG: Independently reviewed.  Initial EKG showed QTC 496, low voltage, LAE, QRS 72, anteroseptal infarction pattern, nonspecific T wave change.  Repeated EKG at 8 PM showed QTC 473, QRS 71, LAE, low voltage, anteroseptal infarction pattern.  Assessment/Plan Principal Problem:   Overdose Active Problems:   Decompensated  hepatic cirrhosis (HCC)   Alcohol abuse   Pancytopenia (HCC)   Alcoholic cirrhosis of liver with ascites ([place])  Ascites due to alcoholic cirrhosis (HCC)   Schizophrenia (HCC)   MDD (major depressive disorder), severe (HCC)   Alcohol dependence ([place])   GERD (gastroesophageal reflux disease)   Thrombocytopenia (HCC)   Acute metabolic encephalopathy   Hypotension   Seizure ([place])   Overdose: Patient possibly overdosed Haldol, Xanax plus alcohol, but not sure exactly what she took.  She has multiple sedative medication on her medication list, including BuSpar, Librium, Prozac, Haldol, hydroxyzine, Topamax, trazodone.  Now patient has altered mental status. Per RN, pt passed swallowing screen.    Poison control was contacted. RN's documented as follows: "Spoke with Levada Dy at [facility] regarding pt, supportive care,  Observe for conduction delays, if QTC >500 replace Potassium and Mag to high normal repeat EKG  If QRS >120 give Sodium Bicarb repeat EKG EKG's q 4-6 hours until stable Beta Blocker symptoms follow ACLS guidelines Monitor Labs: Mag CMP ASA ETOH Tylenol and repeat at 7pm Dilantin  OBSERVATION MIN 12 HOURS OR TILL STABLE "  -will place on SDU for obs -Frequent neuro check -Keep patient n.p.o. until mental status improves -Hold all home medications except for lactulose and Topamax which is important for seizure -repeat EKG q4h -Will give 40 mg of potassium (her potassium is 3.9), and 1 g of magnesium sulfate (her magnesium level is 1.7) -keep patient n.p.o. until mental status improves.  Alcohol abuse: -CiWA protocol  Decompensated hepatic cirrhosis, Ascites due to alcoholic cirrhosis and alcoholic cirrhosis of liver with ascites: pt has altered mental status, which is likely due to overdose.  Hepatic encephalopathy cannot be completely ruled out.  Will check ammonia level.  INR 1.08. Child Pugh score is 9-->class B -Frequent neuro check -Continue  lactulose -f/u ammonia level  Acute metabolic encephalopathy: -see above  Pancytopenia ([place]): Likely due to multifactorial etiology, including liver cirrhosis, malnutrition secondary to alcohol abuse. No bleeding tendency. -Follow-up of the CBC  Schizophrenia ([place]) and MDD (major depressive disorder), severe ([place]): -Suicidal precaution -Hold home medications due to possible overdose -SI precaution  GERD (gastroesophageal reflux disease): -start IV protonix 40 mg bid  Hypotension: Likely due to multifactorial etiology, including overdose, dehydration and continuation of diuretics.  Patient was given IV fluid in the treated shortly with Levophed.  Blood pressures are stabilized currently. -Hold diuretics - IV fluid: 1 L normal saline and a 1 L of Ringer's solution were given; will continue 100 cc/h of normal saline - monitor blood pressure closely  Seizure -Seizure precaution -When necessary Ativan for seizure -Continue Home medications: Topamax -check Topamax level    DVT ppx: SCD Code Status: Full code Family Communication: None at bed side. Disposition Plan:  Anticipate discharge back to previous home environment Consults called: none   Admission status: Obs / tele  Inpatient/tele   medical floor/obs     SDU/inpation       Date of Service 08/22/2018    Ivor Costa [facility] Pager [phone]  If 7PM-7AM, please contact night-coverage [email or web address] Password [email or web address] 08/22/2018, 9:10 PM

## 2018-08-22 NOTE — ED Triage Notes (Signed)
Spoke with Levada Dy at [facility] regarding pt, supportive care,  Observe for conduction delays, if QTC >500 replace Potassium and Mag to high normal repeat EKG  If QRS >120 give Sodium Bicarb repeat EKG EKG's q 4-6 hours until stable Beta Blocker symptoms follow ACLS guidelines Monitor Labs: Mag CMP ASA ETOH Tylenol and repeat at 7pm Dilantin  OBSERVATION MIN 12 HOURS OR TILL STABLE

## 2018-08-22 NOTE — ED Notes (Signed)
20 tabs, 7 half tabs Haloperidol 2mg , 0 tabs chlordiazepoxide 25 mg, 0 tabs trazadone Hcl 50 mg transported to pharmacy, verified with [name], Therapist, sports. Fluoxetine hcl 20 mg tabs, hydrozyzine hcl 25 mg, buspirone hcl 10 mg, topiramate 50 mg, furosemide 20 mg, spirnolactone 50 mg, propanolol 20 mg, gapabentin 200 mg and naproxen 220 mg also taken to pharmacy.

## 2018-08-22 NOTE — ED Notes (Signed)
Stop Levophed per Dr. Ralene Bathe

## 2018-08-22 NOTE — ED Notes (Signed)
Bed: RESB Expected date:  Expected time:  Means of arrival:  Comments: Xanax OD

## 2018-08-22 NOTE — ED Notes (Signed)
ED TO INPATIENT HANDOFF REPORT  Name/Age/Gender Briana Sweeney 47 y.o. female  Code Status    Code Status Orders  (From admission, onward)         Start     Ordered   08/22/18 2019  Full code  Continuous     08/22/18 2019        Code Status History    Date Active Date Inactive Code Status Order ID Comments User Context   02/10/2018 2022 02/14/2018 1737 Full Code [id number]  Harvie Bridge, DO ED   01/24/2018 2333 01/25/2018 0151 Full Code [id number]  Jacqlyn Larsen, PA-C ED   01/13/2018 1808 01/17/2018 1559 Full Code [id number]  Doreatha Lew, MD ED   01/13/2018 1157 01/13/2018 1808 Full Code [id number]  Daleen Bo, MD ED   01/02/2018 2130 01/08/2018 2022 Full Code [id number]  Rise Patience, MD ED   01/02/2018 2057 01/02/2018 2102 Full Code [id number]  Street, [place], [place] ED   10/28/2017 1552 11/02/2017 1840 Full Code [id number]  Vicenta Aly, NP Inpatient   10/09/2017 0038 10/14/2017 1745 Full Code [id number]  Vianne Bulls, MD ED   09/11/2017 2234 09/16/2017 1711 Full Code [id number]  Karmen Bongo, MD Inpatient   09/01/2017 0159 09/04/2017 1436 Full Code [id number]  Vilma Prader, MD Inpatient   07/19/2017 2125 07/28/2017 2031 Full Code [id number]  Vilma Prader, MD ED      Home/SNF/Other Home  Chief Complaint Ingestion  Level of Care/Admitting Diagnosis ED Disposition    ED Disposition Condition [facility] Area: [facility] [100102]  Level of Care: Stepdown [14]  Admit to SDU based on following criteria: Hemodynamic compromise or significant risk of instability:  Patient requiring short term acute titration and management of vasoactive drips, and invasive monitoring (i.e., CVP and Arterial line).  Diagnosis: Overdose [947096]  Admitting Physician: Ivor Costa [[id number]]  Attending Physician: Ivor Costa [[id number]]  PT Class (Do Not Modify): Observation [104]  PT Acc Code (Do Not Modify): Observation [10022]       Medical  History Past Medical History:  Diagnosis Date  . Anxiety   . Bipolar affective disorder ([place])    With anxiety features  . 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 Depression   . GERD (gastroesophageal reflux disease)   . Hematemesis 02/10/2018  . Hepatitis C 2018   hepatitis c and alcohol related hepatitis  . History of blood transfusion    "blood doesn't clot; I fell down and had to have a transfusion"  . History of kidney stones   . Migraine    "when I get really stressed" (09/01/2017)  . Schizophrenia ([place])   . Seizures ([place])    "when I run out of my RX; lots recently" (09/01/2017)    Allergies No Known Allergies  IV Location/Drains/Wounds Patient Lines/Drains/Airways Status   Active Line/Drains/Airways    Name:   Placement date:   Placement time:   Site:   Days:   Peripheral IV 08/22/18 Right Hand   08/22/18    1616    Hand   less than 1   Peripheral IV 08/22/18 Left Arm   08/22/18    1815    Arm   less than 1   Peripheral IV 08/22/18 Right Arm   08/22/18    1820    Arm   less than 1          Labs/Imaging Results  for orders placed or performed during the hospital encounter of 08/22/18 (from the past 48 hour(s))  Urine rapid drug screen (hosp performed)     Status: Abnormal   Collection Time: 08/22/18  4:33 PM  Result Value Ref Range   Opiates NONE DETECTED NONE DETECTED   Cocaine POSITIVE (A) NONE DETECTED   Benzodiazepines POSITIVE (A) NONE DETECTED   Amphetamines NONE DETECTED NONE DETECTED   Tetrahydrocannabinol NONE DETECTED NONE DETECTED   Barbiturates NONE DETECTED NONE DETECTED    Comment: (NOTE) DRUG SCREEN FOR MEDICAL PURPOSES ONLY.  IF CONFIRMATION IS NEEDED FOR ANY PURPOSE, NOTIFY LAB WITHIN 5 DAYS. LOWEST DETECTABLE LIMITS FOR URINE DRUG SCREEN Drug Class                     Cutoff (ng/mL) Amphetamine and metabolites    1000 Barbiturate and metabolites    200 Benzodiazepine                 914 Tricyclics and metabolites      300 Opiates and metabolites        300 Cocaine and metabolites        300 THC                            50 Performed at [facility] [place] [address], [place], [place] [address]   Comprehensive metabolic panel     Status: Abnormal   Collection Time: 08/22/18  5:08 PM  Result Value Ref Range   Sodium 142 135 - 145 mmol/L   Potassium 3.7 3.5 - 5.1 mmol/L   Chloride 116 (H) 98 - 111 mmol/L   CO2 22 22 - 32 mmol/L   Glucose, Bld 83 70 - 99 mg/dL   BUN 5 (L) 6 - 20 mg/dL   Creatinine, Ser 0.66 0.44 - 1.00 mg/dL   Calcium 8.2 (L) 8.9 - 10.3 mg/dL   Total Protein 6.7 6.5 - 8.1 g/dL   Albumin 2.8 (L) 3.5 - 5.0 g/dL   AST 54 (H) 15 - 41 U/L   ALT 33 0 - 44 U/L   Alkaline Phosphatase 115 38 - 126 U/L   Total Bilirubin 0.6 0.3 - 1.2 mg/dL   GFR calc non Af Amer >60 >60 mL/min   GFR calc Af Amer >60 >60 mL/min   Anion gap 4 (L) 5 - 15    Comment: Performed at [facility], [place] [address], [place], [place] [address]  CBC with Differential     Status: Abnormal   Collection Time: 08/22/18  5:08 PM  Result Value Ref Range   WBC 2.6 (L) 4.0 - 10.5 K/uL   RBC 4.42 3.87 - 5.11 MIL/uL   Hemoglobin 13.0 12.0 - 15.0 g/dL   HCT 41.9 36.0 - 46.0 %   MCV 94.8 80.0 - 100.0 fL   MCH 29.4 26.0 - 34.0 pg   MCHC 31.0 30.0 - 36.0 g/dL   RDW 20.2 (H) 11.5 - 15.5 %   Platelets 34 (L) 150 - 400 K/uL    Comment: REPEATED TO VERIFY PLATELET COUNT CONFIRMED BY SMEAR SPECIMEN CHECKED FOR CLOTS Immature Platelet Fraction may be clinically indicated, consider ordering this additional test [id number]    nRBC 0.0 0.0 - 0.2 %   Neutrophils Relative % 42 %   Neutro Abs 1.1 (L) 1.7 - 7.7 K/uL   Lymphocytes Relative 39 %   Lymphs Abs 1.0 0.7 -  4.0 K/uL   Monocytes Relative 13 %   Monocytes Absolute 0.4 0.1 - 1.0 K/uL   Eosinophils Relative 5 %   Eosinophils Absolute 0.1 0.0 - 0.5 K/uL   Basophils Relative 1 %   Basophils Absolute 0.0 0.0 - 0.1 K/uL   RBC Morphology  MORPHOLOGY UNREMARKABLE    Immature Granulocytes 0 %   Abs Immature Granulocytes 0.01 0.00 - 0.07 K/uL    Comment: Performed at [facility], [place] [address], [place], [place] [address]  Urinalysis, Routine w reflex microscopic     Status: Abnormal   Collection Time: 08/22/18  5:08 PM  Result Value Ref Range   Color, Urine COLORLESS (A) YELLOW   APPearance CLEAR CLEAR   Specific Gravity, Urine 1.002 (L) 1.005 - 1.030   pH 6.0 5.0 - 8.0   Glucose, UA NEGATIVE NEGATIVE mg/dL   Hgb urine dipstick NEGATIVE NEGATIVE   Bilirubin Urine NEGATIVE NEGATIVE   Ketones, ur NEGATIVE NEGATIVE mg/dL   Protein, ur NEGATIVE NEGATIVE mg/dL   Nitrite NEGATIVE NEGATIVE   Leukocytes, UA NEGATIVE NEGATIVE    Comment: Performed at [place] [address] [place], [place] [address]  Acetaminophen level     Status: Abnormal   Collection Time: 08/22/18  5:08 PM  Result Value Ref Range   Acetaminophen (Tylenol), Serum <10 (L) 10 - 30 ug/mL    Comment: (NOTE) Therapeutic concentrations vary significantly. A range of 10-30 ug/mL  may be an effective concentration for many patients. However, some  are best treated at concentrations outside of this range. Acetaminophen concentrations >150 ug/mL at 4 hours after ingestion  and >50 ug/mL at 12 hours after ingestion are often associated with  toxic reactions. Performed at [facility], [place] [address] [place], [place] [address]   Salicylate level     Status: None   Collection Time: 08/22/18  5:08 PM  Result Value Ref Range   Salicylate Lvl <9.5 2.8 - 30.0 mg/dL    Comment: Performed at [facility], [place] [address] [place], [place] [address]  Protime-INR     Status: None   Collection Time: 08/22/18  5:08 PM  Result Value Ref Range   Prothrombin Time 13.9 11.4 - 15.2 seconds   INR 1.08     Comment: Performed at [facility], [place] [address], [place], [place]  [address]  Ethanol     Status: Abnormal   Collection Time: 08/22/18  5:08 PM  Result Value Ref Range   Alcohol, Ethyl (B) 218 (H) <10 mg/dL    Comment: (NOTE) Lowest detectable limit for serum alcohol is 10 mg/dL. For medical purposes only. Performed at [facility], [place] [address] [place], [place] [address]   Blood gas, venous     Status: Abnormal   Collection Time: 08/22/18  5:08 PM  Result Value Ref Range   pH, Ven 7.315 7.250 - 7.430   pCO2, Ven 42.1 (L) 44.0 - 60.0 mmHg   pO2, Ven 48.9 (H) 32.0 - 45.0 mmHg   Bicarbonate 20.8 20.0 - 28.0 mmol/L   Acid-base deficit 4.6 (H) 0.0 - 2.0 mmol/L   O2 Saturation 75.9 %   Patient temperature 98.6    Collection site VENOUS    Drawn by DRAWN BY RN    Sample type VENOUS     Comment: Performed at [place] [address] [place], [place] [address]  Magnesium     Status: None  Collection Time: 08/22/18  5:08 PM  Result Value Ref Range   Magnesium 1.7 1.7 - 2.4 mg/dL    Comment: Performed at [facility], [place] [address], [place], [place] [address]  I-stat Chem 8, ED     Status: Abnormal   Collection Time: 08/22/18  5:20 PM  Result Value Ref Range   Sodium 144 135 - 145 mmol/L   Potassium 3.9 3.5 - 5.1 mmol/L   Chloride 114 (H) 98 - 111 mmol/L   BUN <3 (L) 6 - 20 mg/dL   Creatinine, Ser 0.90 0.44 - 1.00 mg/dL   Glucose, Bld 83 70 - 99 mg/dL   Calcium, Ion 1.14 (L) 1.15 - 1.40 mmol/L   TCO2 21 (L) 22 - 32 mmol/L   Hemoglobin 11.9 (L) 12.0 - 15.0 g/dL   HCT 35.0 (L) 36.0 - 46.0 %   No results found.  Pending Labs Unresulted Labs (From admission, onward)    Start     Ordered   08/23/18 7412  Basic metabolic panel  Tomorrow morning,   R     08/22/18 2019   08/23/18 0500  CBC  Tomorrow morning,   R     08/22/18 2019   08/22/18 2025  Pregnancy, urine  Once,   R     08/22/18 2024   08/22/18 2022  Topiramate level  ONCE - STAT,   R     08/22/18 2022   08/22/18 2016   Ammonia  Once,   R     08/22/18 2016   08/22/18 1654  Phenytoin level, free and total  Once,   R     08/22/18 1653          Vitals/Pain Today's Vitals   08/22/18 1946 08/22/18 2000 08/22/18 2010 08/22/18 2040  BP: (!) 156/86 104/75 100/71 105/82  Pulse: 72  70 69  Resp: 15 14 14 15   Temp:      TempSrc:      SpO2: 91%  100% 98%  Weight:      Height:      PainSc:        Isolation Precautions No active isolations  Medications Medications  LORazepam (ATIVAN) injection 1 mg (has no administration in time range)  pantoprazole (PROTONIX) injection 40 mg (has no administration in time range)  0.9 %  sodium chloride infusion (has no administration in time range)  LORazepam (ATIVAN) tablet 1 mg (has no administration in time range)    Or  LORazepam (ATIVAN) injection 1 mg (has no administration in time range)  thiamine (VITAMIN B-1) tablet 100 mg (has no administration in time range)    Or  thiamine (B-1) injection 100 mg (has no administration in time range)  folic acid (FOLVITE) tablet 1 mg (has no administration in time range)  multivitamin with minerals tablet 1 tablet (has no administration in time range)  LORazepam (ATIVAN) injection 0-4 mg (has no administration in time range)    Followed by  LORazepam (ATIVAN) injection 0-4 mg (has no administration in time range)  ondansetron (ZOFRAN) tablet 4 mg (has no administration in time range)    Or  ondansetron (ZOFRAN) injection 4 mg (has no administration in time range)  sodium chloride 0.9 % bolus 1,000 mL (0 mLs Intravenous Stopped 08/22/18 1818)  lactated ringers bolus 1,000 mL (1,000 mLs Intravenous New Bag/Given 08/22/18 1821)    Mobility non-ambulatory

## 2018-08-23 DIAGNOSIS — F322 Major depressive disorder, single episode, severe without psychotic features: Secondary | ICD-10-CM

## 2018-08-23 DIAGNOSIS — T50901D Poisoning by unspecified drugs, medicaments and biological substances, accidental (unintentional), subsequent encounter: Secondary | ICD-10-CM

## 2018-08-23 DIAGNOSIS — F419 Anxiety disorder, unspecified: Secondary | ICD-10-CM | POA: Diagnosis not present

## 2018-08-23 DIAGNOSIS — Z23 Encounter for immunization: Secondary | ICD-10-CM | POA: Diagnosis not present

## 2018-08-23 DIAGNOSIS — T405X1A Poisoning by cocaine, accidental (unintentional), initial encounter: Secondary | ICD-10-CM | POA: Diagnosis not present

## 2018-08-23 DIAGNOSIS — T50904A Poisoning by unspecified drugs, medicaments and biological substances, undetermined, initial encounter: Secondary | ICD-10-CM | POA: Diagnosis not present

## 2018-08-23 DIAGNOSIS — K219 Gastro-esophageal reflux disease without esophagitis: Secondary | ICD-10-CM | POA: Diagnosis not present

## 2018-08-23 LAB — CBC
HCT: 37.2 % (ref 36.0–46.0)
Hemoglobin: 11.3 g/dL — ABNORMAL LOW (ref 12.0–15.0)
MCH: 28.7 pg (ref 26.0–34.0)
MCHC: 30.4 g/dL (ref 30.0–36.0)
MCV: 94.4 fL (ref 80.0–100.0)
Platelets: 31 10*3/uL — ABNORMAL LOW (ref 150–400)
RBC: 3.94 MIL/uL (ref 3.87–5.11)
RDW: 20.1 % — ABNORMAL HIGH (ref 11.5–15.5)
WBC: 2.5 10*3/uL — ABNORMAL LOW (ref 4.0–10.5)
nRBC: 0 % (ref 0.0–0.2)

## 2018-08-23 LAB — BASIC METABOLIC PANEL
Anion gap: 6 (ref 5–15)
BUN: 5 mg/dL — ABNORMAL LOW (ref 6–20)
CO2: 18 mmol/L — ABNORMAL LOW (ref 22–32)
Calcium: 7.7 mg/dL — ABNORMAL LOW (ref 8.9–10.3)
Chloride: 119 mmol/L — ABNORMAL HIGH (ref 98–111)
Creatinine, Ser: 0.67 mg/dL (ref 0.44–1.00)
GFR calc Af Amer: >60 mL/min (ref >60)
GFR calc non Af Amer: >60 mL/min (ref >60)
Glucose, Bld: 93 mg/dL (ref 70–99)
Potassium: 4.5 mmol/L (ref 3.5–5.1)
Sodium: 143 mmol/L (ref 135–145)

## 2018-08-23 LAB — MRSA PCR SCREENING: MRSA by PCR: NEGATIVE

## 2018-08-23 MED ORDER — SPIRONOLACTONE 25 MG PO TABS
50.0000 mg | ORAL_TABLET | Freq: Every day | ORAL | Status: DC
  Administered 2018-08-23 – 2018-08-25 (×3): 50 mg via ORAL
  Filled 2018-08-23 (×3): qty 2

## 2018-08-23 MED ORDER — FUROSEMIDE 20 MG PO TABS
20.0000 mg | ORAL_TABLET | Freq: Every day | ORAL | Status: DC
  Administered 2018-08-23 – 2018-08-25 (×3): 20 mg via ORAL
  Filled 2018-08-23 (×3): qty 1

## 2018-08-23 MED ORDER — GABAPENTIN 100 MG PO CAPS
200.0000 mg | ORAL_CAPSULE | Freq: Three times a day (TID) | ORAL | Status: DC
  Administered 2018-08-23 – 2018-08-25 (×8): 200 mg via ORAL
  Filled 2018-08-23 (×8): qty 2

## 2018-08-23 MED ORDER — NOREPINEPHRINE 4 MG/250ML-% IV SOLN
2.0000 ug/min | INTRAVENOUS | Status: DC
  Administered 2018-08-23: 2 ug/min via INTRAVENOUS
  Filled 2018-08-23: qty 250

## 2018-08-23 MED ORDER — LORAZEPAM 2 MG/ML IJ SOLN
0.0000 mg | Freq: Four times a day (QID) | INTRAMUSCULAR | Status: AC
  Administered 2018-08-23 (×3): 2 mg via INTRAVENOUS
  Administered 2018-08-24 (×3): 1 mg via INTRAVENOUS
  Administered 2018-08-24: 2 mg via INTRAVENOUS
  Administered 2018-08-25: 1 mg via INTRAVENOUS
  Filled 2018-08-23 (×8): qty 1

## 2018-08-23 MED ORDER — FLUOXETINE HCL 20 MG PO CAPS
20.0000 mg | ORAL_CAPSULE | Freq: Every day | ORAL | Status: DC
  Administered 2018-08-23 – 2018-08-25 (×3): 20 mg via ORAL
  Filled 2018-08-23 (×3): qty 1

## 2018-08-23 MED ORDER — LORAZEPAM 2 MG/ML IJ SOLN
0.0000 mg | Freq: Two times a day (BID) | INTRAMUSCULAR | Status: DC
  Administered 2018-08-25: 2 mg via INTRAVENOUS
  Filled 2018-08-23: qty 1

## 2018-08-23 MED ORDER — BUSPIRONE HCL 5 MG PO TABS
10.0000 mg | ORAL_TABLET | Freq: Two times a day (BID) | ORAL | Status: DC
  Administered 2018-08-23 – 2018-08-25 (×5): 10 mg via ORAL
  Filled 2018-08-23 (×4): qty 2
  Filled 2018-08-23: qty 1

## 2018-08-23 NOTE — Consult Note (Signed)
[facility] Face-to-Face Psychiatry Consult   Reason for Consult:  Overdose on multiple medications Referring Physician:  Dr. Brigid Re Patient Identification: Briana Sweeney MRN:  [id number] Principal Diagnosis: Overdose Diagnosis:  Principal Problem:   Overdose Active Problems:   Decompensated hepatic cirrhosis ([place])   Alcohol abuse   Pancytopenia ([place])   Alcoholic cirrhosis of liver with ascites ([place])   Ascites due to alcoholic cirrhosis (HCC)   Schizophrenia (HCC)   MDD (major depressive disorder), severe (HCC)   Alcohol dependence ([place])   GERD (gastroesophageal reflux disease)   Thrombocytopenia (HCC)   Acute metabolic encephalopathy   Hypotension   Seizure ([place])   Total Time spent with patient: 45 minutes  Subjective:   Briana Sweeney is a 47 y.o. female patient admitted after she overdosed on multiple medications.  HPI:   Patient who reports Long history of Substance abuse(Etoh, Benzo, Cocaine), Hepatitis C, Liver  cirrhosis complicated by esophageal varices, Bipolar schizophrenia, Anxiety and  seizure disorder. She was admitted to the hospital with altered mental status/acute metabolic encephalopathy. Patient reports the she overdosed on an unknown quantity of Xanax, Alcohol, Haloperidol while partying with her friends. She currently lives in a shelter at [facility] and reports history of suicide attempts 3x, most recent attempt about 3 months ago for which she was admitted to [facility]. Currently, she denies delusions or psychosis but unable to contract for safety.  Past Psychiatric History: as above  Risk to Self:  yes Risk to Others:  denies Prior Inpatient Therapy:  yes, multiple times in the past per patient Prior Outpatient Therapy:    Past Medical History:  Past Medical History:  Diagnosis Date  . Anxiety   . Bipolar affective disorder ([place])    With anxiety features  . Cirrhosis of liver ([place])    Due to alcohol and hepatitis C  . Depression   . GERD  (gastroesophageal reflux disease)   . Hematemesis 02/10/2018  . Hepatitis C 2018   hepatitis c and alcohol related hepatitis  . History of blood transfusion    "blood doesn't clot; I fell down and had to have a transfusion"  . History of kidney stones   . Migraine    "when I get really stressed" (09/01/2017)  . Schizophrenia ([place])   . Seizures ([place])    "when I run out of my RX; lots recently" (09/01/2017)    Past Surgical History:  Procedure Laterality Date  . ESOPHAGOGASTRODUODENOSCOPY N/A 09/03/2017   Procedure: ESOPHAGOGASTRODUODENOSCOPY (EGD);  Surgeon: Doran Stabler, MD;  Location: [facility];  Service: Gastroenterology;  Laterality: N/A;  . FINGER FRACTURE SURGERY Left    "shattered my pinky"  . FRACTURE SURGERY    . IR PARACENTESIS  07/23/2017  . IR PARACENTESIS  07/2017   "did it twice in the same week" (09/01/2017)  . SHOULDER OPEN ROTATOR CUFF REPAIR Right   . TUBAL LIGATION    . VAGINAL HYSTERECTOMY     Family History:  Family History  Problem Relation Age of Onset  . Lung cancer Mother 22  . Alcohol abuse Mother   . Throat cancer Father 73   Family Psychiatric  History:  Social History:  Social History   Substance and Sexual Activity  Alcohol Use Yes  . Alcohol/week: 63.0 standard drinks  . Types: 63 Cans of beer per week   Comment: weekly "I have cut back"     Social History   Substance and Sexual Activity  Drug Use No    Social  History   Socioeconomic History  . Marital status: Divorced    Spouse name: Not on file  . Number of children: Not on file  . Years of education: Not on file  . Highest education level: Not on file  Occupational History  . Occupation: applying for disability  Social Needs  . Financial resource strain: Not on file  . Food insecurity:    Worry: Not on file    Inability: Not on file  . Transportation needs:    Medical: Not on file    Non-medical: Not on file  Tobacco Use  . Smoking status: Never Smoker  .  Smokeless tobacco: Never Used  Substance and Sexual Activity  . Alcohol use: Yes    Alcohol/week: 63.0 standard drinks    Types: 63 Cans of beer per week    Comment: weekly "I have cut back"  . Drug use: No  . Sexual activity: Not Currently  Lifestyle  . Physical activity:    Days per week: Not on file    Minutes per session: Not on file  . Stress: Not on file  Relationships  . Social connections:    Talks on phone: Not on file    Gets together: Not on file    Attends religious service: Not on file    Active member of club or organization: Not on file    Attends meetings of clubs or organizations: Not on file    Relationship status: Not on file  Other Topics Concern  . Not on file  Social History Narrative   She moved with a boyfriend to [place] and was followed at [facility].  He died of a massive heart attack in 8/18, per her report, and so she moved back to [place] and is living with a friend.   Additional Social History:    Allergies:  No Known Allergies  Labs:  Results for orders placed or performed during the hospital encounter of 08/22/18 (from the past 48 hour(s))  Urine rapid drug screen (hosp performed)     Status: Abnormal   Collection Time: 08/22/18  4:33 PM  Result Value Ref Range   Opiates NONE DETECTED NONE DETECTED   Cocaine POSITIVE (A) NONE DETECTED   Benzodiazepines POSITIVE (A) NONE DETECTED   Amphetamines NONE DETECTED NONE DETECTED   Tetrahydrocannabinol NONE DETECTED NONE DETECTED   Barbiturates NONE DETECTED NONE DETECTED    Comment: (NOTE) DRUG SCREEN FOR MEDICAL PURPOSES ONLY.  IF CONFIRMATION IS NEEDED FOR ANY PURPOSE, NOTIFY LAB WITHIN 5 DAYS. LOWEST DETECTABLE LIMITS FOR URINE DRUG SCREEN Drug Class                     Cutoff (ng/mL) Amphetamine and metabolites    1000 Barbiturate and metabolites    200 Benzodiazepine                 081 Tricyclics and metabolites     300 Opiates and metabolites        300 Cocaine and  metabolites        300 THC                            50 Performed at [facility], [place] [address], [place], [place] [address]   Comprehensive metabolic panel     Status: Abnormal   Collection Time: 08/22/18  5:08 PM  Result Value Ref Range   Sodium 142 135 -  145 mmol/L   Potassium 3.7 3.5 - 5.1 mmol/L   Chloride 116 (H) 98 - 111 mmol/L   CO2 22 22 - 32 mmol/L   Glucose, Bld 83 70 - 99 mg/dL   BUN 5 (L) 6 - 20 mg/dL   Creatinine, Ser 0.66 0.44 - 1.00 mg/dL   Calcium 8.2 (L) 8.9 - 10.3 mg/dL   Total Protein 6.7 6.5 - 8.1 g/dL   Albumin 2.8 (L) 3.5 - 5.0 g/dL   AST 54 (H) 15 - 41 U/L   ALT 33 0 - 44 U/L   Alkaline Phosphatase 115 38 - 126 U/L   Total Bilirubin 0.6 0.3 - 1.2 mg/dL   GFR calc non Af Amer >60 >60 mL/min   GFR calc Af Amer >60 >60 mL/min   Anion gap 4 (L) 5 - 15    Comment: Performed at [facility], [place] [address], [place], [place] [address]  CBC with Differential     Status: Abnormal   Collection Time: 08/22/18  5:08 PM  Result Value Ref Range   WBC 2.6 (L) 4.0 - 10.5 K/uL   RBC 4.42 3.87 - 5.11 MIL/uL   Hemoglobin 13.0 12.0 - 15.0 g/dL   HCT 41.9 36.0 - 46.0 %   MCV 94.8 80.0 - 100.0 fL   MCH 29.4 26.0 - 34.0 pg   MCHC 31.0 30.0 - 36.0 g/dL   RDW 20.2 (H) 11.5 - 15.5 %   Platelets 34 (L) 150 - 400 K/uL    Comment: REPEATED TO VERIFY PLATELET COUNT CONFIRMED BY SMEAR SPECIMEN CHECKED FOR CLOTS Immature Platelet Fraction may be clinically indicated, consider ordering this additional test [id number]    nRBC 0.0 0.0 - 0.2 %   Neutrophils Relative % 42 %   Neutro Abs 1.1 (L) 1.7 - 7.7 K/uL   Lymphocytes Relative 39 %   Lymphs Abs 1.0 0.7 - 4.0 K/uL   Monocytes Relative 13 %   Monocytes Absolute 0.4 0.1 - 1.0 K/uL   Eosinophils Relative 5 %   Eosinophils Absolute 0.1 0.0 - 0.5 K/uL   Basophils Relative 1 %   Basophils Absolute 0.0 0.0 - 0.1 K/uL   RBC Morphology MORPHOLOGY UNREMARKABLE    Immature Granulocytes 0 %    Abs Immature Granulocytes 0.01 0.00 - 0.07 K/uL    Comment: Performed at [facility], [place] [address], [place], [place] [address]  Urinalysis, Routine w reflex microscopic     Status: Abnormal   Collection Time: 08/22/18  5:08 PM  Result Value Ref Range   Color, Urine COLORLESS (A) YELLOW   APPearance CLEAR CLEAR   Specific Gravity, Urine 1.002 (L) 1.005 - 1.030   pH 6.0 5.0 - 8.0   Glucose, UA NEGATIVE NEGATIVE mg/dL   Hgb urine dipstick NEGATIVE NEGATIVE   Bilirubin Urine NEGATIVE NEGATIVE   Ketones, ur NEGATIVE NEGATIVE mg/dL   Protein, ur NEGATIVE NEGATIVE mg/dL   Nitrite NEGATIVE NEGATIVE   Leukocytes, UA NEGATIVE NEGATIVE    Comment: Performed at [place] [address], [place], [place] [address]  Acetaminophen level     Status: Abnormal   Collection Time: 08/22/18  5:08 PM  Result Value Ref Range   Acetaminophen (Tylenol), Serum <10 (L) 10 - 30 ug/mL    Comment: (NOTE) Therapeutic concentrations vary significantly. A range of 10-30 ug/mL  may be an effective concentration for many patients. However, some  are best treated at concentrations outside of this range. Acetaminophen  concentrations >150 ug/mL at 4 hours after ingestion  and >50 ug/mL at 12 hours after ingestion are often associated with  toxic reactions. Performed at [facility], [place] [address] [place], [place] [address]   Salicylate level     Status: None   Collection Time: 08/22/18  5:08 PM  Result Value Ref Range   Salicylate Lvl <6.8 2.8 - 30.0 mg/dL    Comment: Performed at [facility], [place] [address] [place], [place] [address]  Protime-INR     Status: None   Collection Time: 08/22/18  5:08 PM  Result Value Ref Range   Prothrombin Time 13.9 11.4 - 15.2 seconds   INR 1.08     Comment: Performed at [facility], [place] [address] [place], [place] [address]  Ethanol     Status: Abnormal   Collection  Time: 08/22/18  5:08 PM  Result Value Ref Range   Alcohol, Ethyl (B) 218 (H) <10 mg/dL    Comment: (NOTE) Lowest detectable limit for serum alcohol is 10 mg/dL. For medical purposes only. Performed at [facility], [place] [address] [place], [place] [address]   Blood gas, venous     Status: Abnormal   Collection Time: 08/22/18  5:08 PM  Result Value Ref Range   pH, Ven 7.315 7.250 - 7.430   pCO2, Ven 42.1 (L) 44.0 - 60.0 mmHg   pO2, Ven 48.9 (H) 32.0 - 45.0 mmHg   Bicarbonate 20.8 20.0 - 28.0 mmol/L   Acid-base deficit 4.6 (H) 0.0 - 2.0 mmol/L   O2 Saturation 75.9 %   Patient temperature 98.6    Collection site VENOUS    Drawn by DRAWN BY RN    Sample type VENOUS     Comment: Performed at [facility] [address], [place], [place] [address]  Magnesium     Status: None   Collection Time: 08/22/18  5:08 PM  Result Value Ref Range   Magnesium 1.7 1.7 - 2.4 mg/dL    Comment: Performed at [facility], [place] [address] [place], [place] [address]  Pregnancy, urine     Status: None   Collection Time: 08/22/18  5:08 PM  Result Value Ref Range   Preg Test, Ur NEGATIVE NEGATIVE    Comment:        THE SENSITIVITY OF THIS METHODOLOGY IS >20 mIU/mL. Performed at [facility], [place] [address] [place],  [address]   I-stat Chem 8, ED     Status: Abnormal   Collection Time: 08/22/18  5:20 PM  Result Value Ref Range   Sodium 144 135 - 145 mmol/L   Potassium 3.9 3.5 - 5.1 mmol/L   Chloride 114 (H) 98 - 111 mmol/L   BUN <3 (L) 6 - 20 mg/dL   Creatinine, Ser 0.90 0.44 - 1.00 mg/dL   Glucose, Bld 83 70 - 99 mg/dL   Calcium, Ion 1.14 (L) 1.15 - 1.40 mmol/L   TCO2 21 (L) 22 - 32 mmol/L   Hemoglobin 11.9 (L) 12.0 - 15.0 g/dL   HCT 35.0 (L) 36.0 - 46.0 %  Ammonia     Status: None   Collection Time: 08/22/18  9:16 PM  Result Value Ref Range   Ammonia 22 9 - 35 umol/L    Comment: Performed at [facility], [place] [address], [place], [place] [address]  Acetaminophen level     Status: Abnormal   Collection Time:  08/22/18  9:16 PM  Result Value Ref Range   Acetaminophen (Tylenol), Serum <10 (L) 10 - 30 ug/mL    Comment: (NOTE) Therapeutic concentrations vary significantly. A range of 10-30 ug/mL  may be an effective concentration for many patients. However, some  are best treated at concentrations outside of this range. Acetaminophen concentrations >150 ug/mL at 4 hours after ingestion  and >50 ug/mL at 12 hours after ingestion are often associated with  toxic reactions. Performed at [facility], [place] [address] [place], [place] [address]   MRSA PCR Screening     Status: None   Collection Time: 08/22/18 11:09 PM  Result Value Ref Range   MRSA by PCR NEGATIVE NEGATIVE    Comment:        The GeneXpert MRSA Assay (FDA approved for NASAL specimens only), is one component of a comprehensive MRSA colonization surveillance program. It is not intended to diagnose MRSA infection nor to guide or monitor treatment for MRSA infections. Performed at [facility], [place] [address], [place], [place] [address]   Basic metabolic panel     Status: Abnormal   Collection Time: 08/23/18  3:19 AM  Result Value Ref Range   Sodium 143 135 - 145 mmol/L   Potassium 4.5 3.5 - 5.1 mmol/L   Chloride 119 (H) 98 - 111 mmol/L   CO2 18 (L) 22 - 32 mmol/L   Glucose, Bld 93 70 - 99 mg/dL   BUN 5 (L) 6 - 20 mg/dL   Creatinine, Ser 0.67 0.44 - 1.00 mg/dL   Calcium 7.7 (L) 8.9 - 10.3 mg/dL   GFR calc non Af Amer >60 >60 mL/min   GFR calc Af Amer >60 >60 mL/min   Anion gap 6 5 - 15    Comment: Performed at [facility], [place] [address] [place], [place] [address]  CBC     Status: Abnormal   Collection Time: 08/23/18  3:19 AM  Result Value Ref Range   WBC 2.5 (L) 4.0 - 10.5 K/uL   RBC 3.94 3.87 - 5.11 MIL/uL   Hemoglobin 11.3 (L) 12.0 -  15.0 g/dL   HCT 37.2 36.0 - 46.0 %   MCV 94.4 80.0 - 100.0 fL   MCH 28.7 26.0 - 34.0 pg   MCHC 30.4 30.0 - 36.0 g/dL   RDW 20.1 (H) 11.5 - 15.5 %   Platelets 31 (L) 150 - 400 K/uL    Comment: REPEATED TO VERIFY Immature Platelet Fraction may be clinically indicated, consider ordering this additional test [id number] CONSISTENT WITH PREVIOUS RESULT    nRBC 0.0 0.0 - 0.2 %    Comment: Performed at [facility], [place] [address] [place], [place] [address]    Current Facility-Administered Medications  Medication Dose Route Frequency Provider Last Rate Last Dose  . busPIRone (BUSPAR) tablet 10 mg  10 mg Oral BID Purohit, Shrey C, MD   10 mg at 08/23/18 1103  . FLUoxetine (PROZAC) capsule 20 mg  20 mg Oral Daily Purohit, Shrey C, MD   20 mg at 08/23/18 1103  . folic acid (FOLVITE) tablet 1 mg  1 mg Oral Daily Ivor Costa, MD   1 mg at 08/23/18 1101  . furosemide (LASIX) tablet 20 mg  20 mg Oral Daily Purohit, Shrey C, MD   20 mg at 08/23/18 1102  . gabapentin (NEURONTIN) capsule 200 mg  200 mg Oral TID Purohit, Shrey C, MD   200 mg at 08/23/18  1102  . Influenza vac split quadrivalent PF (FLUARIX) injection 0.5 mL  0.5 mL Intramuscular Tomorrow-1000 Ivor Costa, MD      . lactulose (Pillager) 10 GM/15ML solution 20 g  20 g Oral Daily Ivor Costa, MD   20 g at 08/23/18 1101  . LORazepam (ATIVAN) injection 0-4 mg  0-4 mg Intravenous Q6H Purohit, Shrey C, MD   2 mg at 08/23/18 1153   Followed by  . [START ON 08/25/2018] LORazepam (ATIVAN) injection 0-4 mg  0-4 mg Intravenous Q12H Purohit, Shrey C, MD      . LORazepam (ATIVAN) injection 1 mg  1 mg Intravenous Q2H PRN Ivor Costa, MD      . LORazepam (ATIVAN) tablet 1 mg  1 mg Oral Q6H PRN Ivor Costa, MD       Or  . LORazepam (ATIVAN) injection 1 mg  1 mg Intravenous Q6H PRN Ivor Costa, MD      . multivitamin with minerals tablet 1 tablet  1 tablet Oral Daily Ivor Costa, MD   1 tablet at 08/23/18 1102  . ondansetron (ZOFRAN) tablet  4 mg  4 mg Oral Q6H PRN Ivor Costa, MD       Or  . ondansetron [facility]) injection 4 mg  4 mg Intravenous Q6H PRN Ivor Costa, MD      . pantoprazole (PROTONIX) injection 40 mg  40 mg Intravenous Q12H Ivor Costa, MD   40 mg at 08/23/18 1114  . spironolactone (ALDACTONE) tablet 50 mg  50 mg Oral Daily Purohit, Shrey C, MD   50 mg at 08/23/18 1102  . thiamine (VITAMIN B-1) tablet 100 mg  100 mg Oral Daily Ivor Costa, MD   100 mg at 08/23/18 1102   Or  . thiamine (B-1) injection 100 mg  100 mg Intravenous Daily Ivor Costa, MD      . topiramate (TOPAMAX) tablet 50 mg  50 mg Oral Daily Ivor Costa, MD   50 mg at 08/23/18 1103    Musculoskeletal: Strength & Muscle Tone: within normal limits Gait & Station: normal Patient leans: N/A  Psychiatric Specialty Exam: Physical Exam  ROS  Blood pressure 111/78, pulse 84, temperature 98.2 F (36.8 C), temperature source Oral, resp. rate 18, height 4\' 11"  (1.499 m), weight 63.5 kg, SpO2 94 %.Body mass index is 28.27 kg/m.  General Appearance: Casual  Eye Contact:  Good  Speech:  Clear and Coherent  Volume:  Decreased  Mood:  Dysphoric  Affect:  Constricted  Thought Process:  Coherent  Orientation:  Full (Time, Place, and Person)  Thought Content:  Logical  Suicidal Thoughts:  Yes.  without intent/plan  Homicidal Thoughts:  No  Memory:  Immediate;   Fair Recent;   Fair Remote;   Fair  Judgement:  Poor  Insight:  Shallow  Psychomotor Activity:  Psychomotor Retardation  Concentration:  Concentration: Fair and Attention Span: Fair  Recall:  Good  Fund of Knowledge:  Fair  Language:  Good  Akathisia:  No  Handed:  Right  AIMS (if indicated):     Assets:  Communication Skills Desire for Improvement  ADL's:  Intact  Cognition:  WNL  Sleep:        Treatment Plan Summary: Recommendations: -Continue 1:1 sitter for safety -Continue Buspar and Prozac for anxiety -Continue Gabapentin for mood stabilization. --Monitor QTc interval, Hold  Haldol for now to avoid QTc prolongation. -Continue alcohol detox protocol. -Consider placing patient on IVC papers if she refused Voluntary inpatient psychiatric admission-She is clearly  a danger to herself.  Disposition: Recommend psychiatric Inpatient admission when medically cleared. Supportive therapy provided about ongoing stressors.  Corena Pilgrim, MD 08/23/2018 12:04 PM

## 2018-08-23 NOTE — Progress Notes (Signed)
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   Assessment & Plan:   Principal Problem:   Overdose Active Problems:   Decompensated hepatic cirrhosis (HCC)   Alcohol abuse   Pancytopenia (HCC)   Alcoholic cirrhosis of liver with ascites (HCC)   Ascites due to alcoholic cirrhosis (HCC)   Schizophrenia (HCC)   MDD (major depressive disorder), severe (HCC)   Alcohol dependence (HCC)   GERD (gastroesophageal reflux disease)   Thrombocytopenia (HCC)   Acute metabolic encephalopathy   Hypotension   Seizure ([place])  #) Hypotension: Patient briefly was hypotensive in the emergency department and was briefly on norepinephrine.  This improved with IV fluids and she is now off all pressors. -Monitor -Hold propranolol 20 mg twice daily -Restart diuretics  #) Acute metabolic encephalopathy due to poly-ingestion: Patient reports only taking her's prescribed haloperidol.  She does report taking several alprazolam and alcohol she was using this recreationally.  She denies any suicidal or homicidal ideation.  Her acetaminophen and Tylenol levels are undetectable.  ED did talk to poison control who recommended monitoring QTC - Psychiatry consult -Continue sitter until psychiatry clears patient's - Hold sedating medications -Continue telemetry, serial EKGs have shown normal QTC -Strongly informed the patient that no benzodiazepines or opiates will be prescribed at discharge  #) Alcohol abuse: Patient reports a history of complicated  withdrawals including seizures and DTs.  Her last drink was on 08/22/2018. -CIWA protocol - Continue thiamine and folate supplementation  #) Schizophrenia/anxiety/depression: Currently the patient denies any active SI or HI though she has had prior suicide attempts and hospitalizations. -Restart fluoxetine 20 mg daily -Restart buspirone 10 mg twice daily - Hold haloperidol 2 mg nightly -Hold hydroxyzine 25 mg 3 times daily as needed for anxiety - Continue topiramate 50 mg daily -Hold trazodone 50 mg nightly as needed  #) Pain: -Continue gabapentin 200 mg 3 times daily  #) Alcoholic/hep C cirrhosis complicated by varices: -Hold propranolol 20 mg twice daily, will plan to restart on discharge -Restart furosemide 20 mg daily -Restart spironolactone 50 mg daily  #) Pancytopenia: Likely secondary to cirrhosis.  Her counts currently are stable from prior. -Monitor  Fluids: Discontinue IV fluids Electrolytes: Monitor and supplement Nutrition: Regular diet  Prophylaxis: Thrombocytopenic  Disposition: Ending clearance by psychiatry  Full code   Consultants:   Psych  Procedures:   none  Antimicrobials:   none    Subjective: This morning patient reports she is doing fairly well.  Had extensive discussion about her alcohol and substance abuse and she reports that she recreationally drank alcohol and used her prescribed alprazolam and she adamantly denies any suicidal or homicidal ideation.  Objective: Vitals:   08/23/18 0500 08/23/18 0600 08/23/18 0800 08/23/18 0810  BP: 94/74 102/64 111/78   Pulse: 79 84 84   Resp: 13 14 18    Temp:    98.2 F (36.8 C)  TempSrc:    Oral  SpO2: 94% 93% 94%   Weight:      Height:        Intake/Output Summary (Last 24 hours) at 08/23/2018 0903 Last data filed at 08/23/2018 6314 Gross  per 24 hour  Intake 1010.41 ml  Output -  Net 1010.41 ml   Filed Weights   08/22/18 1631 08/22/18 2300  Weight: 63.5 kg 63.5 kg     Examination:  General exam: Appears calm and comfortable  Respiratory system: Clear to auscultation. Respiratory effort normal. Cardiovascular system: Regular rate and rhythm, no murmurs Gastrointestinal system: Soft, nondistended, no rebound or guarding, plus bowel sounds Central nervous system: Alert and oriented.  Grossly intact, moving all extremities Extremities: No lower extremity edema Skin: No rashes over visible skin Psychiatry: Judgement and insight appear poor. Mood & affect appropriate.     Data Reviewed: I have personally reviewed following labs and imaging studies  CBC: Recent Labs  Lab 08/22/18 1708 08/22/18 1720 08/23/18 0319  WBC 2.6*  --  2.5*  NEUTROABS 1.1*  --   --   HGB 13.0 11.9* 11.3*  HCT 41.9 35.0* 37.2  MCV 94.8  --  94.4  PLT 34*  --  31*   Basic Metabolic Panel: Recent Labs  Lab 08/22/18 1708 08/22/18 1720 08/23/18 0319  NA 142 144 143  K 3.7 3.9 4.5  CL 116* 114* 119*  CO2 22  --  18*  GLUCOSE 83 83 93  BUN 5* <3* 5*  CREATININE 0.66 0.90 0.67  CALCIUM 8.2*  --  7.7*  MG 1.7  --   --    GFR: Estimated Creatinine Clearance: 71.2 mL/min (by C-G formula based on SCr of 0.67 mg/dL). Liver Function Tests: Recent Labs  Lab 08/22/18 1708  AST 54*  ALT 33  ALKPHOS 115  BILITOT 0.6  PROT 6.7  ALBUMIN 2.8*   No results for input(s): LIPASE, AMYLASE in the last 168 hours. Recent Labs  Lab 08/22/18 2116  AMMONIA 22   Coagulation Profile: Recent Labs  Lab 08/22/18 1708  INR 1.08   Cardiac Enzymes: No results for input(s): CKTOTAL, CKMB, CKMBINDEX, TROPONINI in the last 168 hours. BNP (last 3 results) No results for input(s): PROBNP in the last 8760 hours. HbA1C: No results for input(s): HGBA1C in the last 72 hours. CBG: No results for input(s): GLUCAP in the last 168 hours. Lipid Profile: No results for input(s): CHOL, HDL, LDLCALC, TRIG, CHOLHDL, LDLDIRECT in the last 72 hours. Thyroid Function Tests: No results  for input(s): TSH, T4TOTAL, FREET4, T3FREE, THYROIDAB in the last 72 hours. Anemia Panel: No results for input(s): VITAMINB12, FOLATE, FERRITIN, TIBC, IRON, RETICCTPCT in the last 72 hours. Sepsis Labs: No results for input(s): PROCALCITON, LATICACIDVEN in the last 168 hours.  Recent Results (from the past 240 hour(s))  MRSA PCR Screening     Status: None   Collection Time: 08/22/18 11:09 PM  Result Value Ref Range Status   MRSA by PCR NEGATIVE NEGATIVE Final    Comment:        The GeneXpert MRSA Assay (FDA approved for NASAL specimens only), is one component of a comprehensive MRSA colonization surveillance program. It is not intended to diagnose MRSA infection nor to guide or monitor treatment for MRSA infections. Performed at [facility], [place] [address], [place],  [address]          Radiology Studies: No results found.      Scheduled Meds: . folic acid  1 mg Oral Daily  . Influenza vac split quadrivalent PF  0.5 mL Intramuscular Tomorrow-1000  . lactulose  20 g Oral Daily  . LORazepam  0-4 mg Intravenous Q6H   Followed by  . [START ON  08/24/2018] LORazepam  0-4 mg Intravenous Q12H  . multivitamin with minerals  1 tablet Oral Daily  . pantoprazole  40 mg Intravenous Q12H  . thiamine  100 mg Oral Daily   Or  . thiamine  100 mg Intravenous Daily  . topiramate  50 mg Oral Daily   Continuous Infusions: . sodium chloride 100 mL/hr at 08/22/18 2145  . norepinephrine (LEVOPHED) Adult infusion Stopped (08/23/18 0357)     LOS: 0 days    Time spent: [place], MD [facility]  If 7PM-7AM, please contact night-coverage [email or web address] Password [email or web address] 08/23/2018, 9:03 AM

## 2018-08-24 DIAGNOSIS — T50901D Poisoning by unspecified drugs, medicaments and biological substances, accidental (unintentional), subsequent encounter: Secondary | ICD-10-CM | POA: Diagnosis not present

## 2018-08-24 DIAGNOSIS — K219 Gastro-esophageal reflux disease without esophagitis: Secondary | ICD-10-CM | POA: Diagnosis not present

## 2018-08-24 DIAGNOSIS — G9341 Metabolic encephalopathy: Secondary | ICD-10-CM | POA: Diagnosis not present

## 2018-08-24 DIAGNOSIS — Z23 Encounter for immunization: Secondary | ICD-10-CM | POA: Diagnosis not present

## 2018-08-24 DIAGNOSIS — K7031 Alcoholic cirrhosis of liver with ascites: Secondary | ICD-10-CM | POA: Diagnosis not present

## 2018-08-24 DIAGNOSIS — T405X1A Poisoning by cocaine, accidental (unintentional), initial encounter: Secondary | ICD-10-CM | POA: Diagnosis not present

## 2018-08-24 DIAGNOSIS — F419 Anxiety disorder, unspecified: Secondary | ICD-10-CM | POA: Diagnosis not present

## 2018-08-24 DIAGNOSIS — F1010 Alcohol abuse, uncomplicated: Secondary | ICD-10-CM | POA: Diagnosis not present

## 2018-08-24 LAB — COMPREHENSIVE METABOLIC PANEL
ALT: 37 U/L (ref 0–44)
AST: 65 U/L — ABNORMAL HIGH (ref 15–41)
Albumin: 2.7 g/dL — ABNORMAL LOW (ref 3.5–5.0)
Alkaline Phosphatase: 115 U/L (ref 38–126)
Anion gap: 8 (ref 5–15)
BUN: 8 mg/dL (ref 6–20)
CO2: 22 mmol/L (ref 22–32)
Calcium: 8.5 mg/dL — ABNORMAL LOW (ref 8.9–10.3)
Chloride: 107 mmol/L (ref 98–111)
Creatinine, Ser: 0.82 mg/dL (ref 0.44–1.00)
GFR calc Af Amer: >60 mL/min (ref >60)
GFR calc non Af Amer: >60 mL/min (ref >60)
Glucose, Bld: 101 mg/dL — ABNORMAL HIGH (ref 70–99)
Potassium: 3.8 mmol/L (ref 3.5–5.1)
Sodium: 137 mmol/L (ref 135–145)
Total Bilirubin: 1.2 mg/dL (ref 0.3–1.2)
Total Protein: 6.7 g/dL (ref 6.5–8.1)

## 2018-08-24 LAB — CBC
HCT: 37.4 % (ref 36.0–46.0)
Hemoglobin: 11.8 g/dL — ABNORMAL LOW (ref 12.0–15.0)
MCH: 28.9 pg (ref 26.0–34.0)
MCHC: 31.6 g/dL (ref 30.0–36.0)
MCV: 91.7 fL (ref 80.0–100.0)
Platelets: 28 10*3/uL — CL (ref 150–400)
RBC: 4.08 MIL/uL (ref 3.87–5.11)
RDW: 19.7 % — ABNORMAL HIGH (ref 11.5–15.5)
WBC: 2.3 10*3/uL — ABNORMAL LOW (ref 4.0–10.5)
nRBC: 0 % (ref 0.0–0.2)

## 2018-08-24 LAB — MAGNESIUM: Magnesium: 1.8 mg/dL (ref 1.7–2.4)

## 2018-08-24 LAB — TOPIRAMATE LEVEL: Topiramate Lvl: 1.8 ug/mL — ABNORMAL LOW (ref 2.0–25.0)

## 2018-08-24 MED ORDER — POLYVINYL ALCOHOL 1.4 % OP SOLN
1.0000 [drp] | OPHTHALMIC | Status: DC | PRN
  Filled 2018-08-24: qty 15

## 2018-08-24 MED ORDER — GLYCERIN-HYPROMELLOSE-PEG 400 0.2-0.2-1 % OP SOLN
1.0000 [drp] | OPHTHALMIC | Status: DC | PRN
  Administered 2018-08-24: 1 [drp] via OPHTHALMIC
  Filled 2018-08-24: qty 15

## 2018-08-24 NOTE — Progress Notes (Signed)
CRITICAL VALUE ALERT  Critical Value:  Platelet 28  Date & Time Notied:  08/24/18 06:05am  Provider Notified: Kennon Holter  Orders Received/Actions taken: No new orders recieved

## 2018-08-24 NOTE — Progress Notes (Signed)
PROGRESS NOTE    Briana Sweeney  CVE:[id number] DOB: [birth year] DOA: 08/22/2018 PCP: [name], FNP   Brief Narrative: 47 year old with past medical history relevant for polysubstance abuse (alcohol abuse, cocaine), alcoholic and hepatitis C cirrhosis complicated by esophageal varices, ascites and pancytopenia, schizophrenia, mood disorders, seizure disorder who presented with altered mental status/acute metabolic encephalopathy after overdose of unknown amount of haloperidol, alprazolam as well as alcohol.  In ED, she was altered, briefly hypotensive requiring IV fluid boluses and transient IV Levophed.  Acute encephalopathy has resolved.  Psychiatry evaluated, continued one-to-one safety sitter and recommend psychiatric inpatient admission when medically cleared.  Patient is medically stable today for discharge to [facility] when bed available.  Clinical social work consulted.   Assessment & Plan:   Principal Problem:   Overdose Active Problems:   Decompensated hepatic cirrhosis (HCC)   Alcohol abuse   Pancytopenia (HCC)   Alcoholic cirrhosis of liver with ascites (HCC)   Ascites due to alcoholic cirrhosis (HCC)   Schizophrenia (HCC)   MDD (major depressive disorder), severe (HCC)   Alcohol dependence (HCC)   GERD (gastroesophageal reflux disease)   Thrombocytopenia (HCC)   Acute metabolic encephalopathy   Hypotension   Seizure (HCC)  Overdose: Patient reportedly took unknown amount of Haldol, Xanax along with alcohol.  She also had multiple sedative medications on her medication list including BuSpar, lithium, Prozac, Haldol, hydroxyzine, Topamax and trazodone.  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  Await [facility] transfer.  Transient hypotension/shock: Patient briefly was hypotensive in the emergency department and was briefly on norepinephrine.  This improved with IV fluids and she is now off all pressors. -Hold propranolol 20 mg twice daily -Restarted diuretics  Acute metabolic  encephalopathy due to poly-ingestion: Patient reports only taking her's prescribed haloperidol.  She does report taking several alprazolam and alcohol she was using this recreationally.  She denies any suicidal or homicidal ideation.  Her acetaminophen and Tylenol levels were undetectable.  ED did talk to poison control who recommended monitoring QTC - Psychiatry consult appreciated, recommend inpatient psychiatry admission. - Hold sedating medications -Telemetry shows sinus rhythm without arrhythmias.  QTc 447 ms. -Strongly informed the patient that no benzodiazepines or opiates will be prescribed at discharge  Alcohol abuse: Patient reports a history of complicated withdrawals including seizures and DTs.  Her last drink was on 08/22/2018. -CIWA protocol.  No overt withdrawal. - Continue thiamine and folate supplementation  Schizophrenia/anxiety/depression: Currently the patient denies any active SI or HI though she has had prior suicide attempts and hospitalizations. -Restart fluoxetine 20 mg daily, buspirone 10 mg twice daily, topiramate 50 mg daily - Hold haloperidol 2 mg nightly, hydroxyzine 25 mg 3 times daily as needed for anxiety, trazodone 50 mg nightly as needed  Pain: -Continue gabapentin 200 mg 3 times daily  Alcoholic/hep C cirrhosis complicated by varices, ascites: -Hold propranolol 20 mg twice daily, will plan to restart on discharge -Restarted furosemide 20 mg daily & spironolactone 50 mg daily  Pancytopenia: Likely secondary to cirrhosis and ongoing alcohol abuse.  Her counts currently are stable from prior. -Monitor  GERD: Continue IV PPIs.  Seizure disorder: PRN Ativan for seizure, continue Topamax.   DVT prophylaxis: SCDs CODE STATUS: Full code Family communication: None at bedside Disposition: Patient medically stable for DC to inpatient psychiatry when bed available.    Consultants:   Psych  Procedures:   None  Antimicrobials:   None     Subjective: Patient reports intermittent and chronic low back pain.  Denied suicidal  or homicidal ideations.  States that if she does not return to her homeless shelter in 2 days, she will reduce her space/bed.  Clinical social work consulted for same.  Objective: Vitals:   08/23/18 1741 08/23/18 2337 08/24/18 0610 08/24/18 0937  BP: (!) 132/95 121/84 134/88 110/79  Pulse: 76 76 73 79  Resp: 20 17 18 16   Temp: 98.3 F (36.8 C) 98.2 F (36.8 C) 97.8 F (36.6 C)   TempSrc: Oral Oral Oral   SpO2: 95% 93% 95% 95%  Weight:      Height:        Intake/Output Summary (Last 24 hours) at 08/24/2018 1157 Last data filed at 08/24/2018 0937 Gross per 24 hour  Intake 120 ml  Output 3 ml  Net 117 ml   Filed Weights   08/22/18 1631 08/22/18 2300  Weight: 63.5 kg 63.5 kg    Examination:  General exam: Appears calm and comfortable  Respiratory system: Clear to auscultation. Respiratory effort normal.  Stable Cardiovascular system: Regular rate and rhythm, no murmurs.  Telemetry personally reviewed: Sinus rhythm.  No JVD or pedal edema. Gastrointestinal system: Soft, nondistended, no rebound or guarding.  Normal bowel sounds heard.  Stable. Central nervous system: Alert and oriented.  No focal neurological deficits. Extremities: Symmetric 5 x 5 power. Skin: No rashes over visible skin Psychiatry: Judgement and insight appear poor. Mood & affect flat. 1: sitter at bedside    Data Reviewed: I have personally reviewed following labs and imaging studies  CBC: Recent Labs  Lab 08/22/18 1708 08/22/18 1720 08/23/18 0319 08/24/18 0528  WBC 2.6*  --  2.5* 2.3*  NEUTROABS 1.1*  --   --   --   HGB 13.0 11.9* 11.3* 11.8*  HCT 41.9 35.0* 37.2 37.4  MCV 94.8  --  94.4 91.7  PLT 34*  --  31* 28*   Basic Metabolic Panel: Recent Labs  Lab 08/22/18 1708 08/22/18 1720 08/23/18 0319 08/24/18 0528  NA 142 144 143 137  K 3.7 3.9 4.5 3.8  CL 116* 114* 119* 107  CO2 22  --  18* 22   GLUCOSE 83 83 93 101*  BUN 5* <3* 5* 8  CREATININE 0.66 0.90 0.67 0.82  CALCIUM 8.2*  --  7.7* 8.5*  MG 1.7  --   --  1.8   GFR: Estimated Creatinine Clearance: 69.4 mL/min (by C-G formula based on SCr of 0.82 mg/dL). Liver Function Tests: Recent Labs  Lab 08/22/18 1708 08/24/18 0528  AST 54* 65*  ALT 33 37  ALKPHOS 115 115  BILITOT 0.6 1.2  PROT 6.7 6.7  ALBUMIN 2.8* 2.7*    Recent Labs  Lab 08/22/18 2116  AMMONIA 22   Coagulation Profile: Recent Labs  Lab 08/22/18 1708  INR 1.08     Recent Results (from the past 240 hour(s))  MRSA PCR Screening     Status: None   Collection Time: 08/22/18 11:09 PM  Result Value Ref Range Status   MRSA by PCR NEGATIVE NEGATIVE Final    Comment:        The GeneXpert MRSA Assay (FDA approved for NASAL specimens only), is one component of a comprehensive MRSA colonization surveillance program. It is not intended to diagnose MRSA infection nor to guide or monitor treatment for MRSA infections. Performed at [facility], [place] [address] [place], [place] [address]          Radiology Studies: No results found.      Scheduled  Meds: . busPIRone  10 mg Oral BID  . FLUoxetine  20 mg Oral Daily  . folic acid  1 mg Oral Daily  . furosemide  20 mg Oral Daily  . gabapentin  200 mg Oral TID  . lactulose  20 g Oral Daily  . LORazepam  0-4 mg Intravenous Q6H   Followed by  . [START ON 08/25/2018] LORazepam  0-4 mg Intravenous Q12H  . multivitamin with minerals  1 tablet Oral Daily  . pantoprazole  40 mg Intravenous Q12H  . spironolactone  50 mg Oral Daily  . thiamine  100 mg Oral Daily   Or  . thiamine  100 mg Intravenous Daily  . topiramate  50 mg Oral Daily   Continuous Infusions:    LOS: 0 days    Time spent: [place], MD [facility]  If 7PM-7AM, please contact night-coverage [email or web address] Password [email or web address] 08/24/2018, 11:57 AM

## 2018-08-25 ENCOUNTER — Inpatient Hospital Stay (HOSPITAL_COMMUNITY)
Admission: AD | Admit: 2018-08-25 | Discharge: 2018-08-29 | DRG: 897 | Disposition: A | Discharge status: Home / Self Care | Payer: Medicaid Other | Source: Intra-hospital | Attending: Psychiatry | Admitting: Psychiatry

## 2018-08-25 ENCOUNTER — Other Ambulatory Visit: Payer: Self-pay | Admitting: Registered Nurse

## 2018-08-25 ENCOUNTER — Other Ambulatory Visit: Payer: Self-pay

## 2018-08-25 ENCOUNTER — Encounter (HOSPITAL_COMMUNITY): Payer: Self-pay | Admitting: *Deleted

## 2018-08-25 DIAGNOSIS — G40909 Epilepsy, unspecified, not intractable, without status epilepticus: Secondary | ICD-10-CM | POA: Diagnosis present

## 2018-08-25 DIAGNOSIS — F10239 Alcohol dependence with withdrawal, unspecified: Secondary | ICD-10-CM | POA: Diagnosis present

## 2018-08-25 DIAGNOSIS — D72819 Decreased white blood cell count, unspecified: Secondary | ICD-10-CM | POA: Diagnosis present

## 2018-08-25 DIAGNOSIS — F209 Schizophrenia, unspecified: Secondary | ICD-10-CM | POA: Diagnosis present

## 2018-08-25 DIAGNOSIS — F419 Anxiety disorder, unspecified: Secondary | ICD-10-CM | POA: Diagnosis present

## 2018-08-25 DIAGNOSIS — Z590 Homelessness: Secondary | ICD-10-CM

## 2018-08-25 DIAGNOSIS — F10939 Alcohol use, unspecified with withdrawal, unspecified: Secondary | ICD-10-CM | POA: Diagnosis present

## 2018-08-25 DIAGNOSIS — K7011 Alcoholic hepatitis with ascites: Secondary | ICD-10-CM | POA: Diagnosis present

## 2018-08-25 DIAGNOSIS — Z808 Family history of malignant neoplasm of other organs or systems: Secondary | ICD-10-CM | POA: Diagnosis not present

## 2018-08-25 DIAGNOSIS — Z79899 Other long term (current) drug therapy: Secondary | ICD-10-CM | POA: Diagnosis not present

## 2018-08-25 DIAGNOSIS — F200 Paranoid schizophrenia: Secondary | ICD-10-CM | POA: Diagnosis present

## 2018-08-25 DIAGNOSIS — F1014 Alcohol abuse with alcohol-induced mood disorder: Secondary | ICD-10-CM | POA: Diagnosis present

## 2018-08-25 DIAGNOSIS — G8929 Other chronic pain: Secondary | ICD-10-CM | POA: Diagnosis present

## 2018-08-25 DIAGNOSIS — Z915 Personal history of self-harm: Secondary | ICD-10-CM

## 2018-08-25 DIAGNOSIS — F1924 Other psychoactive substance dependence with psychoactive substance-induced mood disorder: Secondary | ICD-10-CM | POA: Diagnosis not present

## 2018-08-25 DIAGNOSIS — Z801 Family history of malignant neoplasm of trachea, bronchus and lung: Secondary | ICD-10-CM

## 2018-08-25 DIAGNOSIS — G4700 Insomnia, unspecified: Secondary | ICD-10-CM | POA: Diagnosis present

## 2018-08-25 DIAGNOSIS — T50901D Poisoning by unspecified drugs, medicaments and biological substances, accidental (unintentional), subsequent encounter: Secondary | ICD-10-CM | POA: Diagnosis not present

## 2018-08-25 DIAGNOSIS — F1020 Alcohol dependence, uncomplicated: Secondary | ICD-10-CM | POA: Diagnosis present

## 2018-08-25 DIAGNOSIS — Z811 Family history of alcohol abuse and dependence: Secondary | ICD-10-CM

## 2018-08-25 DIAGNOSIS — F1024 Alcohol dependence with alcohol-induced mood disorder: Secondary | ICD-10-CM | POA: Diagnosis present

## 2018-08-25 DIAGNOSIS — K7031 Alcoholic cirrhosis of liver with ascites: Secondary | ICD-10-CM | POA: Diagnosis not present

## 2018-08-25 DIAGNOSIS — F250 Schizoaffective disorder, bipolar type: Secondary | ICD-10-CM | POA: Diagnosis not present

## 2018-08-25 DIAGNOSIS — K7290 Hepatic failure, unspecified without coma: Secondary | ICD-10-CM | POA: Diagnosis present

## 2018-08-25 DIAGNOSIS — G9341 Metabolic encephalopathy: Secondary | ICD-10-CM | POA: Diagnosis not present

## 2018-08-25 DIAGNOSIS — T405X1A Poisoning by cocaine, accidental (unintentional), initial encounter: Secondary | ICD-10-CM | POA: Diagnosis not present

## 2018-08-25 DIAGNOSIS — K7460 Unspecified cirrhosis of liver: Secondary | ICD-10-CM | POA: Diagnosis present

## 2018-08-25 DIAGNOSIS — F322 Major depressive disorder, single episode, severe without psychotic features: Secondary | ICD-10-CM | POA: Diagnosis present

## 2018-08-25 DIAGNOSIS — F600 Paranoid personality disorder: Secondary | ICD-10-CM | POA: Diagnosis not present

## 2018-08-25 DIAGNOSIS — T50992A Poisoning by other drugs, medicaments and biological substances, intentional self-harm, initial encounter: Secondary | ICD-10-CM | POA: Diagnosis not present

## 2018-08-25 LAB — CBC
HCT: 36.7 % (ref 36.0–46.0)
Hemoglobin: 11.5 g/dL — ABNORMAL LOW (ref 12.0–15.0)
MCH: 28.7 pg (ref 26.0–34.0)
MCHC: 31.3 g/dL (ref 30.0–36.0)
MCV: 91.5 fL (ref 80.0–100.0)
Platelets: 31 10*3/uL — ABNORMAL LOW (ref 150–400)
RBC: 4.01 MIL/uL (ref 3.87–5.11)
RDW: 19.6 % — ABNORMAL HIGH (ref 11.5–15.5)
WBC: 2.5 10*3/uL — ABNORMAL LOW (ref 4.0–10.5)
nRBC: 0 % (ref 0.0–0.2)

## 2018-08-25 LAB — PHENYTOIN LEVEL, FREE AND TOTAL
Phenytoin, Free: NOT DETECTED ug/mL (ref 1.0–2.0)
Phenytoin, Total: <0.8 ug/mL — ABNORMAL LOW (ref 10.0–20.0)

## 2018-08-25 MED ORDER — LORAZEPAM 1 MG PO TABS
1.0000 mg | ORAL_TABLET | Freq: Four times a day (QID) | ORAL | Status: DC
  Administered 2018-08-26 (×2): 1 mg via ORAL
  Filled 2018-08-25 (×3): qty 1

## 2018-08-25 MED ORDER — LORAZEPAM 1 MG PO TABS: 1.0000 mg | ORAL_TABLET | Freq: Every day | ORAL | Status: DC

## 2018-08-25 MED ORDER — LORAZEPAM 1 MG PO TABS
1.0000 mg | ORAL_TABLET | Freq: Four times a day (QID) | ORAL | Status: AC | PRN
  Administered 2018-08-25: 1 mg via ORAL

## 2018-08-25 MED ORDER — VITAMIN B-1 100 MG PO TABS
100.0000 mg | ORAL_TABLET | Freq: Every day | ORAL | Status: DC
  Administered 2018-08-26 – 2018-08-29 (×4): 100 mg via ORAL
  Filled 2018-08-25 (×3): qty 1
  Filled 2018-08-25: qty 7
  Filled 2018-08-25 (×2): qty 1

## 2018-08-25 MED ORDER — ADULT MULTIVITAMIN W/MINERALS CH
1.0000 | ORAL_TABLET | Freq: Every day | ORAL | Status: DC
  Administered 2018-08-26 – 2018-08-29 (×4): 1 via ORAL
  Filled 2018-08-25 (×3): qty 1
  Filled 2018-08-25: qty 7
  Filled 2018-08-25 (×2): qty 1

## 2018-08-25 MED ORDER — FOLIC ACID 1 MG PO TABS: 1.0000 mg | ORAL_TABLET | Freq: Every day | ORAL | 0 refills | Status: DC

## 2018-08-25 MED ORDER — ADULT MULTIVITAMIN W/MINERALS CH: 1.0000 | ORAL_TABLET | Freq: Every day | ORAL | Status: DC

## 2018-08-25 MED ORDER — ONDANSETRON 4 MG PO TBDP: 4.0000 mg | ORAL_TABLET | Freq: Four times a day (QID) | ORAL | Status: AC | PRN

## 2018-08-25 MED ORDER — THIAMINE HCL 100 MG PO TABS: 100.0000 mg | ORAL_TABLET | Freq: Every day | ORAL | 0 refills | Status: DC

## 2018-08-25 MED ORDER — HYDROXYZINE HCL 25 MG PO TABS
25.0000 mg | ORAL_TABLET | Freq: Four times a day (QID) | ORAL | Status: AC | PRN
  Administered 2018-08-26 – 2018-08-28 (×6): 25 mg via ORAL
  Filled 2018-08-25 (×6): qty 1

## 2018-08-25 MED ORDER — PANTOPRAZOLE SODIUM 40 MG PO TBEC: 40.0000 mg | DELAYED_RELEASE_TABLET | Freq: Every day | ORAL | Status: DC

## 2018-08-25 MED ORDER — LORAZEPAM 1 MG PO TABS: 1.0000 mg | ORAL_TABLET | Freq: Three times a day (TID) | ORAL | Status: DC

## 2018-08-25 MED ORDER — LOPERAMIDE HCL 2 MG PO CAPS
2.0000 mg | ORAL_CAPSULE | ORAL | Status: AC | PRN
  Administered 2018-08-26: 4 mg via ORAL
  Filled 2018-08-25: qty 2

## 2018-08-25 MED ORDER — LORAZEPAM 1 MG PO TABS: 1.0000 mg | ORAL_TABLET | Freq: Two times a day (BID) | ORAL | Status: DC

## 2018-08-25 NOTE — Progress Notes (Signed)
Clinical Social Worker received consult to assist patient in being placed into an inpatient psychiatric bed. Patient at this time has a bed at [facility] for 6pm today. patient will go into room 304 bed 1 and receiving MD is Dr. Mallie Darting. Patient made aware and she stated she is willing to go to [facility] bed. Patient signed voluntary consent form and paper placed on her chart. [facility] was contacted for a 5:50 pick up. RN and MD aware of patients discharge plan. RN to contact [phone] for report.   Briana Sweeney, MSW,  [place]

## 2018-08-25 NOTE — Discharge Summary (Signed)
Physician Discharge Summary  Briana Sweeney XIP:[id number] DOB: April 28, 1971  PCP: [name], FNP  Admit date: 08/22/2018 Discharge date: 08/25/2018  Recommendations for Outpatient Follow-up:  1. Patient being discharged to [facility] for inpatient psychiatric care. 2. Follow repeat labs (CBC & CMP) in 1 week. [place], FNP/PCP upon discharge from [place]: None Equipment/Devices: None  Discharge Condition: Improved and stable CODE STATUS: Full Diet recommendation: Heart healthy diet.  Discharge Diagnoses:  Principal Problem:   Overdose Active Problems:   Decompensated hepatic cirrhosis (HCC)   Alcohol abuse   Pancytopenia (HCC)   Alcoholic cirrhosis of liver with ascites (HCC)   Ascites due to alcoholic cirrhosis (HCC)   Schizophrenia (HCC)   MDD (major depressive disorder), severe (HCC)   Alcohol dependence (HCC)   GERD (gastroesophageal reflux disease)   Thrombocytopenia (HCC)   Acute metabolic encephalopathy   Hypotension   Seizure (HCC)   Brief Summary: 47 year old with past medical history relevant for polysubstance abuse (alcohol abuse, cocaine), alcoholic and hepatitis C cirrhosis complicated by esophageal varices, ascites and pancytopenia, schizophrenia, mood disorders, seizure disorder who presented with altered mental status/acute metabolic encephalopathy after overdose of unknown amount of haloperidol, alprazolam as well as alcohol.  In ED, she was altered, briefly hypotensive requiring IV fluid boluses and transient IV Levophed.  Acute encephalopathy has resolved.  Psychiatry evaluated, continued one-to-one safety sitter and recommend psychiatric inpatient admission when medically cleared.  Patient is medically stable today for discharge to [facility] when bed available.    Assessment & Plan:   Overdose: Patient reportedly took unknown amount of Haldol, Xanax along with alcohol.  She also had multiple  sedative medications on her medication list including BuSpar, lithium, Prozac, Haldol, hydroxyzine, Topamax and trazodone.  Continued 1:1 Air cabin crew.  Patient will be discharged to [facility] for further evaluation and inpatient psychiatric care.  Transient hypotension/shock: Patient briefly was hypotensive in the emergency department and was briefly on norepinephrine.  This improved with IV fluids and she is now off all pressors. -Hold propranolol 20 mg twice daily due to cocaine noted on UDS. -Restarted diuretics  Acute metabolic encephalopathy due to poly-ingestion: Patient reports only taking her's prescribed haloperidol.  She does report taking several alprazolam and alcohol she was using this recreationally.  She denies any suicidal or homicidal ideation.  Her acetaminophen and Tylenol levels were undetectable.  ED did talk to poison control who recommended monitoring QTC - Psychiatry consult appreciated, recommend inpatient psychiatry admission. - Hold sedating medications -Telemetry shows sinus rhythm without arrhythmias.  QTc 447 ms. -Strongly informed the patient that no benzodiazepines or opiates will be prescribed at discharge  Alcohol abuse: Patient reports a history of complicated withdrawals including seizures and DTs.  Her last drink was on 08/22/2018. -CIWA protocol.  No overt withdrawal. - Continue thiamine and folate supplementation  Schizophrenia/anxiety/depression: Currently the patient denies any active SI or HI though she has had prior suicide attempts and hospitalizations. -Restart fluoxetine 20 mg daily, buspirone 10 mg twice daily, topiramate 50 mg daily - Hold haloperidol 2 mg nightly, hydroxyzine 25 mg 3 times daily as needed for anxiety, trazodone 50 mg nightly as needed -Further evaluation and management at [facility].  Pain: -Continue gabapentin 200 mg 3 times daily  Alcoholic/hep C cirrhosis complicated by varices, ascites: -Hold propranolol 20 mg twice daily due  to cocaine positive on UDS.  Outpatient follow-up. -Restarted furosemide 20 mg daily & spironolactone 50 mg daily  Pancytopenia: Likely secondary to  cirrhosis and ongoing alcohol abuse.  Her counts currently are stable from prior. -Monitor CBCs periodically.  GERD: Continue IV PPIs.  Seizure disorder: PRN Ativan for seizure, continue Topamax.  Cocaine abuse: Cessation counseled.    Consultants:   Psych  Procedures:   None   Discharge Instructions  Discharge Instructions    Call MD for:   Complete by:  As directed    Suicidal or homicidal ideations.   Call MD for:  difficulty breathing, headache or visual disturbances   Complete by:  As directed    Call MD for:  extreme fatigue   Complete by:  As directed    Call MD for:  persistant dizziness or light-headedness   Complete by:  As directed    Call MD for:  persistant nausea and vomiting   Complete by:  As directed    Call MD for:  severe uncontrolled pain   Complete by:  As directed    Call MD for:  temperature >100.4   Complete by:  As directed    Diet - low sodium heart healthy   Complete by:  As directed    Increase activity slowly   Complete by:  As directed        Medication List    STOP taking these medications   haloperidol 2 MG tablet Commonly known as:  HALDOL   hydrOXYzine 25 MG tablet Commonly known as:  ATARAX/VISTARIL   propranolol 20 MG tablet Commonly known as:  INDERAL   traZODone 50 MG tablet Commonly known as:  DESYREL     TAKE these medications   busPIRone 10 MG tablet Commonly known as:  BUSPAR Take 1 tablet (10 mg total) by mouth 2 (two) times daily.   FLUoxetine 20 MG tablet Commonly known as:  PROZAC Take 20 mg by mouth daily.   folic acid 1 MG tablet Commonly known as:  FOLVITE Take 1 tablet (1 mg total) by mouth daily. Start taking on:  August 26, 2018   furosemide 20 MG tablet Commonly known as:  LASIX Take 1 tablet (20 mg total) by mouth daily.    gabapentin 100 MG capsule Commonly known as:  NEURONTIN Take 2 capsules (200 mg total) by mouth 3 (three) times daily.   lactulose 10 GM/15ML solution Commonly known as:  CHRONULAC Take 30 mLs (20 g total) by mouth daily.   multivitamin with minerals Tabs tablet Take 1 tablet by mouth daily. Start taking on:  August 26, 2018   naproxen sodium 220 MG tablet Commonly known as:  ALEVE Take 220 mg by mouth daily as needed (pain).   spironolactone 50 MG tablet Commonly known as:  ALDACTONE Take 1 tablet (50 mg total) by mouth daily.   thiamine 100 MG tablet Take 1 tablet (100 mg total) by mouth daily. Start taking on:  August 26, 2018   topiramate 50 MG tablet Commonly known as:  TOPAMAX Take 1 tablet (50 mg total) by mouth daily.      Follow-up Information    [name], FNP. Schedule an appointment as soon as possible for a visit.   Specialty:  Family Medicine Why:  Upon discharge from [facility]. Contact information: [place] [address] [phone]          No Known Allergies    Procedures/Studies:    Subjective: Reports intermittent chronic leg pain.  Denies any other complaints.  Safety sitter at bedside.  Discharge Exam:  Vitals:   08/24/18  1344 08/24/18 2306 08/25/18 0506 08/25/18 1153  BP: 116/90 99/70 105/81 109/83  Pulse: 78 86 84 66  Resp: 16 20 20 20   Temp: 98.5 F (36.9 C) 98.3 F (36.8 C) (!) 97.5 F (36.4 C) 98.7 F (37.1 C)  TempSrc: Oral Oral Oral Oral  SpO2: 95% 93% 94% 93%  Weight:      Height:        General exam: Appears calm and comfortable  Respiratory system: Clear to auscultation. Respiratory effort normal.   Cardiovascular system: Regular rate and rhythm, no murmurs.  No JVD or pedal edema. Gastrointestinal system: Soft, nondistended, no rebound or guarding.  Normal bowel sounds heard.   Central nervous system: Alert and oriented.  No focal neurological  deficits. Extremities: Symmetric 5 x 5 power. Skin: No rashes over visible skin Psychiatry: Judgement and insight appear poor. Mood & affect flat. 1:1 sitter at bedside     The results of significant diagnostics from this hospitalization (including imaging, microbiology, ancillary and laboratory) are listed below for reference.     Microbiology: Recent Results (from the past 240 hour(s))  MRSA PCR Screening     Status: None   Collection Time: 08/22/18 11:09 PM  Result Value Ref Range Status   MRSA by PCR NEGATIVE NEGATIVE Final    Comment:        The GeneXpert MRSA Assay (FDA approved for NASAL specimens only), is one component of a comprehensive MRSA colonization surveillance program. It is not intended to diagnose MRSA infection nor to guide or monitor treatment for MRSA infections. Performed at [facility], [place] [address], [place], [place] [address]      Labs: CBC: Recent Labs  Lab 08/22/18 1708 08/22/18 1720 08/23/18 0319 08/24/18 0528 08/25/18 0617  WBC 2.6*  --  2.5* 2.3* 2.5*  NEUTROABS 1.1*  --   --   --   --   HGB 13.0 11.9* 11.3* 11.8* 11.5*  HCT 41.9 35.0* 37.2 37.4 36.7  MCV 94.8  --  94.4 91.7 91.5  PLT 34*  --  31* 28* 31*   Basic Metabolic Panel: Recent Labs  Lab 08/22/18 1708 08/22/18 1720 08/23/18 0319 08/24/18 0528  NA 142 144 143 137  K 3.7 3.9 4.5 3.8  CL 116* 114* 119* 107  CO2 22  --  18* 22  GLUCOSE 83 83 93 101*  BUN 5* <3* 5* 8  CREATININE 0.66 0.90 0.67 0.82  CALCIUM 8.2*  --  7.7* 8.5*  MG 1.7  --   --  1.8   Liver Function Tests: Recent Labs  Lab 08/22/18 1708 08/24/18 0528  AST 54* 65*  ALT 33 37  ALKPHOS 115 115  BILITOT 0.6 1.2  PROT 6.7 6.7  ALBUMIN 2.8* 2.7*   Urinalysis    Component Value Date/Time   COLORURINE COLORLESS (A) 08/22/2018 1708   APPEARANCEUR CLEAR 08/22/2018 1708   LABSPEC 1.002 (L) 08/22/2018 1708   PHURINE 6.0 08/22/2018 1708   GLUCOSEU NEGATIVE 08/22/2018 1708    HGBUR NEGATIVE 08/22/2018 1708   BILIRUBINUR NEGATIVE 08/22/2018 1708   BILIRUBINUR negative 07/22/2018 1209   BILIRUBINUR neg 02/20/2018 1248   KETONESUR NEGATIVE 08/22/2018 1708   PROTEINUR NEGATIVE 08/22/2018 1708   UROBILINOGEN 0.2 07/22/2018 1209   UROBILINOGEN 0.2 12/02/2017 0836   NITRITE NEGATIVE 08/22/2018 1708   LEUKOCYTESUR NEGATIVE 08/22/2018 1708      Time coordinating discharge: 40 minutes  SIGNED:  Vernell Leep, MD, FACP, [facility]. [facility] Pager [phone]  If 7PM-7AM, please contact night-coverage [email or web address] Password [email or web address] 08/25/2018, 4:07 PM

## 2018-08-25 NOTE — Tx Team (Signed)
Initial Treatment Plan 08/25/2018 11:30 PM [name] NID:[id number]    PATIENT STRESSORS: Financial difficulties Medication change or noncompliance Substance abuse   PATIENT STRENGTHS: Ability for insight Average or above average intelligence Capable of independent living General fund of knowledge Motivation for treatment/growth   PATIENT IDENTIFIED PROBLEMS: Depression Substance abuse Suicidal thoughts "I would like to go to [employer] while I am here"                     DISCHARGE CRITERIA:  Ability to meet basic life and health needs Improved stabilization in mood, thinking, and/or behavior Reduction of life-threatening or endangering symptoms to within safe limits Verbal commitment to aftercare and medication compliance Withdrawal symptoms are absent or subacute and managed without 24-hour nursing intervention  PRELIMINARY DISCHARGE PLAN: Attend aftercare/continuing care group  PATIENT/FAMILY INVOLVEMENT: This treatment plan has been presented to and reviewed with the patient, Briana Sweeney, and/or family member, .  The patient and family have been given the opportunity to ask questions and make suggestions.  [place], [place], [place] 08/25/2018, 11:30 PM

## 2018-08-25 NOTE — Discharge Instructions (Signed)

## 2018-08-25 NOTE — Progress Notes (Signed)
Briana Sweeney is a 48 year old female pt admitted on voluntary basis. On admission, she reports that she came from a medical floor and spoke about she was there because she was partying too much. She reports that she took some xanax, drank some locos and smoked some marijuana and then she went to lay down. She believes that someone called 911 at [facility] as that is where she is currently staying. She denies any SI and is able to contract for safety while in the hospital. She reports that she is an alcoholic and reports that she still feels she is withdrawing. Briana Sweeney was tremulous and fidgety on admission. She reports that she has been to [facility] in the past and reports that it was for detox from alcohol. She reports being homeless currently and hopes to be able to go back to [employer] upon discharge. Briana Sweeney was escorted to the unit, oriented to the milieu and safety maintained.

## 2018-08-26 DIAGNOSIS — F1014 Alcohol abuse with alcohol-induced mood disorder: Secondary | ICD-10-CM

## 2018-08-26 DIAGNOSIS — F250 Schizoaffective disorder, bipolar type: Secondary | ICD-10-CM

## 2018-08-26 DIAGNOSIS — T50992A Poisoning by other drugs, medicaments and biological substances, intentional self-harm, initial encounter: Secondary | ICD-10-CM

## 2018-08-26 DIAGNOSIS — F419 Anxiety disorder, unspecified: Secondary | ICD-10-CM

## 2018-08-26 MED ORDER — PANTOPRAZOLE SODIUM 40 MG PO TBEC
40.0000 mg | DELAYED_RELEASE_TABLET | Freq: Every day | ORAL | Status: DC
  Administered 2018-08-26 – 2018-08-29 (×4): 40 mg via ORAL
  Filled 2018-08-26 (×3): qty 1
  Filled 2018-08-26: qty 7
  Filled 2018-08-26 (×3): qty 1

## 2018-08-26 MED ORDER — FLUOXETINE HCL 20 MG PO CAPS
20.0000 mg | ORAL_CAPSULE | Freq: Every day | ORAL | Status: DC
  Administered 2018-08-26 – 2018-08-29 (×4): 20 mg via ORAL
  Filled 2018-08-26: qty 1
  Filled 2018-08-26: qty 7
  Filled 2018-08-26 (×5): qty 1

## 2018-08-26 MED ORDER — TOPIRAMATE 25 MG PO TABS
50.0000 mg | ORAL_TABLET | Freq: Every day | ORAL | Status: DC
  Administered 2018-08-26: 50 mg via ORAL
  Filled 2018-08-26 (×4): qty 2
  Filled 2018-08-26: qty 7

## 2018-08-26 MED ORDER — IBUPROFEN 400 MG PO TABS: 400.0000 mg | ORAL_TABLET | Freq: Four times a day (QID) | ORAL | Status: DC | PRN

## 2018-08-26 MED ORDER — HALOPERIDOL 5 MG PO TABS
5.0000 mg | ORAL_TABLET | Freq: Two times a day (BID) | ORAL | Status: DC
  Administered 2018-08-26 – 2018-08-29 (×6): 5 mg via ORAL
  Filled 2018-08-26: qty 14
  Filled 2018-08-26 (×4): qty 1
  Filled 2018-08-26: qty 14
  Filled 2018-08-26 (×4): qty 1

## 2018-08-26 MED ORDER — LACTULOSE 10 GM/15ML PO SOLN
20.0000 g | Freq: Every day | ORAL | Status: DC
  Administered 2018-08-26: 20 g via ORAL
  Filled 2018-08-26 (×3): qty 30

## 2018-08-26 MED ORDER — LORAZEPAM 1 MG PO TABS
1.0000 mg | ORAL_TABLET | Freq: Once | ORAL | Status: AC
  Administered 2018-08-26: 1 mg via ORAL
  Filled 2018-08-26: qty 1

## 2018-08-26 MED ORDER — FUROSEMIDE 20 MG PO TABS
20.0000 mg | ORAL_TABLET | Freq: Every day | ORAL | Status: DC
  Administered 2018-08-26 – 2018-08-29 (×4): 20 mg via ORAL
  Filled 2018-08-26: qty 7
  Filled 2018-08-26 (×6): qty 1

## 2018-08-26 MED ORDER — BUSPIRONE HCL 10 MG PO TABS
10.0000 mg | ORAL_TABLET | Freq: Two times a day (BID) | ORAL | Status: DC
  Administered 2018-08-26 – 2018-08-29 (×7): 10 mg via ORAL
  Filled 2018-08-26 (×2): qty 1
  Filled 2018-08-26: qty 14
  Filled 2018-08-26 (×2): qty 1
  Filled 2018-08-26: qty 14
  Filled 2018-08-26 (×5): qty 1

## 2018-08-26 MED ORDER — GABAPENTIN 100 MG PO CAPS
200.0000 mg | ORAL_CAPSULE | Freq: Three times a day (TID) | ORAL | Status: DC
  Administered 2018-08-26 – 2018-08-29 (×9): 200 mg via ORAL
  Filled 2018-08-26: qty 2
  Filled 2018-08-26: qty 42
  Filled 2018-08-26 (×3): qty 2
  Filled 2018-08-26: qty 42
  Filled 2018-08-26 (×9): qty 2
  Filled 2018-08-26: qty 42
  Filled 2018-08-26: qty 2

## 2018-08-26 MED ORDER — SPIRONOLACTONE 50 MG PO TABS
50.0000 mg | ORAL_TABLET | Freq: Every day | ORAL | Status: DC
  Administered 2018-08-26 – 2018-08-29 (×4): 50 mg via ORAL
  Filled 2018-08-26 (×2): qty 1
  Filled 2018-08-26: qty 2
  Filled 2018-08-26 (×3): qty 1
  Filled 2018-08-26: qty 14

## 2018-08-26 NOTE — Progress Notes (Signed)
Nursing Progress Note: 7p-7a D: Pt currently presents with a anxious/pleasant/tremulous affect and behavior. Interacting minimally with the milieu. Pt reports good sleep during the previous night with current medication regimen. Pt did not attend wrap-up group.  A: Pt provided with medications per providers orders. Pt's labs and vitals were monitored throughout the night. Pt supported emotionally and encouraged to express concerns and questions. Pt educated on medications.  R: Pt's safety ensured with 15 minute and environmental checks. Pt currently denies SI, HI, and AVH. Pt verbally contracts to seek staff if SI,HI, or AVH occurs and to consult with staff before acting on any harmful thoughts. Will continue to monitor.

## 2018-08-26 NOTE — Progress Notes (Signed)
Patient did not attend the evening speaker NA meeting. Pt was notified that group was beginning but remained in bed.   

## 2018-08-26 NOTE — Tx Team (Signed)
Interdisciplinary Treatment and Diagnostic Plan Update  08/26/2018 Time of Session: 0830AM Briana Sweeney MRN: [id number]  Principal Diagnosis: MDD, recurrent, severe  Secondary Diagnoses: Active Problems:   MDD (major depressive disorder), severe (HCC)   Current Medications:  Current Facility-Administered Medications  Medication Dose Route Frequency Provider Last Rate Last Dose  . busPIRone (BUSPAR) tablet 10 mg  10 mg Oral BID Sharma Covert, MD      . FLUoxetine (PROZAC) capsule 20 mg  20 mg Oral Daily Sharma Covert, MD   20 mg at 08/26/18 1006  . furosemide (LASIX) tablet 20 mg  20 mg Oral Daily Sharma Covert, MD   20 mg at 08/26/18 1006  . gabapentin (NEURONTIN) capsule 200 mg  200 mg Oral TID Sharma Covert, MD   200 mg at 08/26/18 1006  . hydrOXYzine (ATARAX/VISTARIL) tablet 25 mg  25 mg Oral Q6H PRN Lindon Romp A, NP   25 mg at 08/26/18 6546  . lactulose (CHRONULAC) 10 GM/15ML solution 20 g  20 g Oral Daily Sharma Covert, MD   Stopped at 08/26/18 1010  . loperamide (IMODIUM) capsule 2-4 mg  2-4 mg Oral PRN Lindon Romp A, NP   4 mg at 08/26/18 5035  . LORazepam (ATIVAN) tablet 1 mg  1 mg Oral Q6H PRN Lindon Romp A, NP   1 mg at 08/25/18 2116  . LORazepam (ATIVAN) tablet 1 mg  1 mg Oral QID Lindon Romp A, NP   1 mg at 08/26/18 4656   Followed by  . [START ON 08/27/2018] LORazepam (ATIVAN) tablet 1 mg  1 mg Oral TID Rozetta Nunnery, NP       Followed by  . [START ON 08/28/2018] LORazepam (ATIVAN) tablet 1 mg  1 mg Oral BID Rozetta Nunnery, NP       Followed by  . [START ON 08/30/2018] LORazepam (ATIVAN) tablet 1 mg  1 mg Oral Daily Lindon Romp A, NP      . multivitamin with minerals tablet 1 tablet  1 tablet Oral Daily Lindon Romp A, NP   1 tablet at 08/26/18 [phone]  . ondansetron (ZOFRAN-ODT) disintegrating tablet 4 mg  4 mg Oral Q6H PRN Lindon Romp A, NP      . pantoprazole (PROTONIX) EC tablet 40 mg  40 mg Oral Daily Sharma Covert, MD   40 mg at  08/26/18 1006  . spironolactone (ALDACTONE) tablet 50 mg  50 mg Oral Daily Sharma Covert, MD   50 mg at 08/26/18 1009  . thiamine (VITAMIN B-1) tablet 100 mg  100 mg Oral Daily Lindon Romp A, NP   100 mg at 08/26/18 5170  . topiramate (TOPAMAX) tablet 50 mg  50 mg Oral QHS Sharma Covert, MD       PTA Medications: Medications Prior to Admission  Medication Sig Dispense Refill Last Dose  . busPIRone (BUSPAR) 10 MG tablet Take 1 tablet (10 mg total) by mouth 2 (two) times daily. 30 tablet 2 unknown at unknown  . FLUoxetine (PROZAC) 20 MG tablet Take 20 mg by mouth daily.  2 unknown at unknown  . folic acid (FOLVITE) 1 MG tablet Take 1 tablet (1 mg total) by mouth daily. 30 tablet 0   . furosemide (LASIX) 20 MG tablet Take 1 tablet (20 mg total) by mouth daily. 30 tablet 2 unknown at unknown  . gabapentin (NEURONTIN) 100 MG capsule Take 2 capsules (200 mg total) by mouth 3 (three) times daily. [place]  capsule 3 unknown at unknown  . lactulose (CHRONULAC) 10 GM/15ML solution Take 30 mLs (20 g total) by mouth daily. (Patient not taking: Reported on 08/22/2018) 240 mL 3 Not Taking at Unknown time  . Multiple Vitamin (MULTIVITAMIN WITH MINERALS) TABS tablet Take 1 tablet by mouth daily.     . naproxen sodium (ALEVE) 220 MG tablet Take 220 mg by mouth daily as needed (pain).   unknown at unknown  . spironolactone (ALDACTONE) 50 MG tablet Take 1 tablet (50 mg total) by mouth daily. 30 tablet 2 unknown at unknown  . thiamine 100 MG tablet Take 1 tablet (100 mg total) by mouth daily. 30 tablet 0   . topiramate (TOPAMAX) 50 MG tablet Take 1 tablet (50 mg total) by mouth daily. 30 tablet 2 unknown at unknown    Patient Stressors: Financial difficulties Medication change or noncompliance Substance abuse  Patient Strengths: Ability for insight Average or above average intelligence Capable of independent living [employer] of knowledge Motivation for treatment/growth  Treatment Modalities:  Medication Management, Group therapy, Case management,  1 to 1 session with clinician, Psychoeducation, Recreational therapy.   Physician Treatment Plan for Primary Diagnosis: MDD, recurrent, severe  Medication Management: Evaluate patient's response, side effects, and tolerance of medication regimen.  Therapeutic Interventions: 1 to 1 sessions, Unit Group sessions and Medication administration.  Evaluation of Outcomes: Not Met  Physician Treatment Plan for Secondary Diagnosis: Active Problems:   MDD (major depressive disorder), severe ([place])   Medication Management: Evaluate patient's response, side effects, and tolerance of medication regimen.  Therapeutic Interventions: 1 to 1 sessions, Unit Group sessions and Medication administration.  Evaluation of Outcomes: Not Met   RN Treatment Plan for Primary Diagnosis: MDD, recurrent, severe Long Term Goal(s): Knowledge of disease and therapeutic regimen to maintain health will improve  Short Term Goals: Ability to remain free from injury will improve, Ability to verbalize frustration and anger appropriately will improve, Ability to participate in decision making will improve and Ability to identify and develop effective coping behaviors will improve  Medication Management: RN will administer medications as ordered by provider, will assess and evaluate patient's response and provide education to patient for prescribed medication. RN will report any adverse and/or side effects to prescribing provider.  Therapeutic Interventions: 1 on 1 counseling sessions, Psychoeducation, Medication administration, Evaluate responses to treatment, Monitor vital signs and CBGs as ordered, Perform/monitor CIWA, COWS, AIMS and Fall Risk screenings as ordered, Perform wound care treatments as ordered.  Evaluation of Outcomes: Not Met   LCSW Treatment Plan for Primary Diagnosis: MDD, recurrent, severe Long Term Goal(s): Safe transition to appropriate next  level of care at discharge, Engage patient in therapeutic group addressing interpersonal concerns.  Short Term Goals: Engage patient in aftercare planning with referrals and resources, Increase emotional regulation, Facilitate patient progression through stages of change regarding substance use diagnoses and concerns and Identify triggers associated with mental health/substance abuse issues  Therapeutic Interventions: Assess for all discharge needs, 1 to 1 time with Social worker, Explore available resources and support systems, Assess for adequacy in community support network, Educate family and significant other(s) on suicide prevention, Complete Psychosocial Assessment, Interpersonal group therapy.  Evaluation of Outcomes: Not Met   Progress in Treatment: Attending groups: No. New to unit. Continuing to assess.  Participating in groups: No. Taking medication as prescribed: Yes. Toleration medication: Yes. Family/Significant other contact made: No, will contact:  family member if pt consents to collateral contact.  Patient understands diagnosis: Yes. Discussing patient  identified problems/goals with staff: Yes. Medical problems stabilized or resolved: Yes. Denies suicidal/homicidal ideation: Yes. Issues/concerns per patient self-inventory: No. Other: n/a   New problem(s) identified: No, Describe:  n/a  New Short Term/Long Term Goal(s): detox, medication management for mood stabilization; elimination of SI thoughts; development of comprehensive mental wellness/sobriety plan.   Patient Goals:  "I want to get into AA and get some more resources to help me stay sober."   Discharge Plan or Barriers: CSW assessing for appropriate referrals. [place] pamphlet, Mobile Crisis information, and AA/NA information provided to patient for additional community support and resources.   Reason for Continuation of Hospitalization: Anxiety Depression Medication stabilization Suicidal ideation Withdrawal  symptoms  Estimated Length of Stay: Monday, 08/31/18  Attendees: Patient: Briana Sweeney  08/26/2018 11:26 AM  Physician: Dr. Mallie Darting MD 08/26/2018 11:26 AM  Nursing: Clarise Cruz RN; Alyssa RN 08/26/2018 11:26 AM  RN Care Manager:x 08/26/2018 11:26 AM  Social Worker: Janice Norrie LCSW 08/26/2018 11:26 AM  Recreational Therapist: x 08/26/2018 11:26 AM  Other: Lindell Spar NP 08/26/2018 11:26 AM  Other:  08/26/2018 11:26 AM  Other: 08/26/2018 11:26 AM    Scribe for Treatment Team: Avelina Laine, LCSW 08/26/2018 11:26 AM
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
## 2018-08-26 NOTE — Progress Notes (Signed)
Tech found a scarf, hat, and a coat with strings in patient room while doing environmentals. Items were locked in patient locker. Will Continue to monitor,

## 2018-08-26 NOTE — Progress Notes (Signed)
Patient ID: Briana Sweeney, female   DOB: 04/10/1971, 47 y.o.   MRN: [id number]  D. Pt presents with a blunted affect and anxious behavior. Pt oriented to person and time but not place and she believes the President is [place] currently. Pt reports that she wants to be discharged today although she is homeless and does not have a place to stay.   A. Labs and vitals monitored. Pt given and educated on medications. Pt supported emotionally and encouraged to express concerns and ask questions. MD notified of patients mental status, home medications restarted and will push fluids.   R. Pt remains safe with 15 minute checks.Pt currently denies SI/HI and AVH and agrees to contact staff before acting on any harmful thoughts. Will continue POC.

## 2018-08-26 NOTE — Plan of Care (Signed)
D: Patient in bed on approach. Patient has notable tremors and reports "feeling like I'm going to come out of my skin". Patient is cooperative. Patient denies SI, HI, AVH, and verbally contracts for safety.    A: Medications administered per MD order. Support provided. Patient educated on safety on the unit and medications. Routine safety checks every 15 minutes. Patient stated understanding to tell nurse about any new physical symptoms. Patient understands to tell staff of any needs.     R: No adverse drug reactions noted. Patient verbally contracts for safety. Patient remains safe at this time and will continue to monitor.   Problem: Education: Goal: Knowledge of [place] [facility] Education information/materials will improve Outcome: Progressing   Problem: Safety: Goal: Periods of time without injury will increase Outcome: Progressing   Patient oriented to the unit. Patient remains safe and will continue to monitor.

## 2018-08-26 NOTE — Progress Notes (Signed)
Recreation Therapy Notes  Date: 12.18.19 Time: 0930 Location: 300 Hall Dayroom  Group Topic: Stress Management  Goal Area(s) Addresses:  Patient will verbalize importance of using healthy stress management.  Patient will identify positive emotions associated with healthy stress management.   Intervention: Stress Management  Activity : Progressive Muscle Relaxation.  LRT introduced the stress management technique of progressive muscle relaxation.  LRT read a script guiding patients through the process of tensing and releasing each muscle one at a time.  Education:  Stress Management, Discharge Planning.   Education Outcome: Acknowledges edcuation/In group clarification offered/Needs additional education  Clinical Observations/Feedback: Pt did not attend group .    Blessed Girdner, LRT/CTRS         Afreen Siebels A 08/26/2018 11:51 AM 

## 2018-08-26 NOTE — Progress Notes (Signed)
Adult Psychoeducational Group Note  Date:  08/26/2018 Time:  2:15 AM  Group Topic/Focus:  Wellness Toolbox:   The focus of this group is to discuss various aspects of wellness, balancing those aspects and exploring ways to increase the ability to experience wellness.  Patients will create a wellness toolbox for use upon discharge.  Participation Level:  Did Not Attend  Participation Quality:    Affect:    Cognitive:    Insight:   Engagement in Group:   Modes of Intervention:   Additional Comments:  Pt was invited to attend but declined.  Briana Sweeney 08/26/2018, 2:15 AM

## 2018-08-26 NOTE — BHH Suicide Risk Assessment (Signed)
[facility] INPATIENT:  Family/Significant Other Suicide Prevention Education  Suicide Prevention Education:  Patient Refusal for Family/Significant Other Suicide Prevention Education: The patient Briana Sweeney has refused to provide written consent for family/significant other to be provided Family/Significant Other Suicide Prevention Education during admission and/or prior to discharge.  Physician notified.  SPE completed with pt, as pt refused to consent to family contact. SPI pamphlet provided to pt and pt was encouraged to share information with support network, ask questions, and talk about any concerns relating to SPE. Pt denies access to guns/firearms and verbalized understanding of information provided. Mobile Crisis information also provided to pt.   Avelina Laine LCSW 08/26/2018, 2:38 PM

## 2018-08-26 NOTE — BHH Group Notes (Signed)
BHH Group Notes:  (Nursing/MHT/Case Management/Adjunct)  Date:  08/26/2018  Time:  4:00 PM  Type of Therapy:  Nurse Education  Participation Level:  Did Not Attend  Summary of Progress/Problems: Patient was invited to group but did not attend.  Kenneith Stief 08/26/2018, 6:46 PM 
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
## 2018-08-27 LAB — CBC WITH DIFFERENTIAL/PLATELET
Abs Immature Granulocytes: 0 10*3/uL (ref 0.00–0.07)
Basophils Absolute: 0 10*3/uL (ref 0.0–0.1)
Basophils Relative: 0 %
Eosinophils Absolute: 0.1 10*3/uL (ref 0.0–0.5)
Eosinophils Relative: 5 %
HCT: 40.6 % (ref 36.0–46.0)
Hemoglobin: 12.6 g/dL (ref 12.0–15.0)
Immature Granulocytes: 0 %
Lymphocytes Relative: 41 %
Lymphs Abs: 0.9 10*3/uL (ref 0.7–4.0)
MCH: 29 pg (ref 26.0–34.0)
MCHC: 31 g/dL (ref 30.0–36.0)
MCV: 93.3 fL (ref 80.0–100.0)
Monocytes Absolute: 0.4 10*3/uL (ref 0.1–1.0)
Monocytes Relative: 16 %
Neutro Abs: 0.9 10*3/uL — ABNORMAL LOW (ref 1.7–7.7)
Neutrophils Relative %: 38 %
Platelets: 39 10*3/uL — ABNORMAL LOW (ref 150–400)
RBC: 4.35 MIL/uL (ref 3.87–5.11)
RDW: 19.5 % — ABNORMAL HIGH (ref 11.5–15.5)
WBC: 2.3 10*3/uL — ABNORMAL LOW (ref 4.0–10.5)
nRBC: 0 % (ref 0.0–0.2)

## 2018-08-27 LAB — HEPATIC FUNCTION PANEL
ALT: 37 U/L (ref 0–44)
AST: 52 U/L — ABNORMAL HIGH (ref 15–41)
Albumin: 2.8 g/dL — ABNORMAL LOW (ref 3.5–5.0)
Alkaline Phosphatase: 93 U/L (ref 38–126)
Bilirubin, Direct: 0.2 mg/dL (ref 0.0–0.2)
Indirect Bilirubin: 0.8 mg/dL (ref 0.3–0.9)
Total Bilirubin: 1 mg/dL (ref 0.3–1.2)
Total Protein: 6.9 g/dL (ref 6.5–8.1)

## 2018-08-27 LAB — AMMONIA: Ammonia: 62 umol/L — ABNORMAL HIGH (ref 9–35)

## 2018-08-27 LAB — LIPASE, BLOOD: Lipase: 54 U/L — ABNORMAL HIGH (ref 11–51)

## 2018-08-27 LAB — APTT: aPTT: 27 seconds (ref 24–36)

## 2018-08-27 LAB — PATHOLOGIST SMEAR REVIEW

## 2018-08-27 MED ORDER — LACTULOSE 10 GM/15ML PO SOLN
20.0000 g | Freq: Two times a day (BID) | ORAL | Status: DC
  Administered 2018-08-27 – 2018-08-29 (×4): 20 g via ORAL
  Filled 2018-08-27: qty 420
  Filled 2018-08-27 (×6): qty 30
  Filled 2018-08-27: qty 420

## 2018-08-27 NOTE — BHH Group Notes (Signed)
The focus of this group is to help patients establish daily goals to achieve during treatment and discuss how the patient can incorporate goal setting into their daily lives to aide in recovery.  Pt did not attend 

## 2018-08-27 NOTE — Progress Notes (Signed)
[facility] MD Progress Note  08/27/2018 10:19 AM Briana Sweeney  MRN:  [id number] Subjective:   Patient is seen and examined.  Patient is a 47 year old female who originally presented on emergency department on 12/14 after an intentional overdose of Xanax, Haldol and alcohol.   Objective: Patient is seen and examined.  Patient's 47 year old female with the above-stated past psychiatric history who is seen in follow-up.  She looks significantly better today.  She is alert and oriented x3.  She is up ambulating.  Her cognition seems to be improved today as well.  Unfortunately her ammonia went up to 66 this a.m., and her white count remains in the 2 range.  She stated that she had been told in the past that her hepatitis C was "okay and not to worry about it".  I also questioned about whether she had had an HIV in the past and she denied this.  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  Social work is contacted her previous living circumstances, and they are more than willing to take her back.  She stated that she is looking forward to getting her disability approved, and having "money to be able to find some place to live".  We went through extensively all the physical manifestations of her alcoholism, and her risk for death from many of these things.  She has minimal insight towards her alcohol issue.  She denied any auditory or visual hallucinations this morning.  I re-added her Haldol 5 mg p.o. twice daily yesterday.  Her blood pressure remains relatively low at 97/79.  Pulse is 93.  She slept 5 hours last night.  Her CIWA this a.m. was 6.  Principal Problem: Alcohol abuse with alcohol-induced mood disorder ([place]) Diagnosis: Principal Problem:   Alcohol abuse with alcohol-induced mood disorder ([place]) Active Problems:   Decompensated hepatic cirrhosis (HCC)   Alcohol withdrawal syndrome with complication (H[facility])   Schizophrenia ([place])   Alcohol dependence ([place])  Total Time spent with patient: 30 minutes  Past  Psychiatric History: See admission H&P  Past Medical History:  Past Medical History:  Diagnosis Date  . Anxiety   . Bipolar affective disorder ([place])    With anxiety features  . Cirrhosis of liver ([place])    Due to alcohol and hepatitis C  . Depression   . GERD (gastroesophageal reflux disease)   . Hematemesis 02/10/2018  . Hepatitis C 2018   hepatitis c and alcohol related hepatitis  . History of blood transfusion    "blood doesn't clot; I fell down and had to have a transfusion"  . History of kidney stones   . Migraine    "when I get really stressed" (09/01/2017)  . Schizophrenia ([place])   . Seizures ([place])    "when I run out of my RX; lots recently" (09/01/2017)    Past Surgical History:  Procedure Laterality Date  . ESOPHAGOGASTRODUODENOSCOPY N/A 09/03/2017   Procedure: ESOPHAGOGASTRODUODENOSCOPY (EGD);  Surgeon: Doran Stabler, MD;  Location: [place];  Service: Gastroenterology;  Laterality: N/A;  . FINGER FRACTURE SURGERY Left    "shattered my pinky"  . FRACTURE SURGERY    . IR PARACENTESIS  07/23/2017  . IR PARACENTESIS  07/2017   "did it twice in the same week" (09/01/2017)  . SHOULDER OPEN ROTATOR CUFF REPAIR Right   . TUBAL LIGATION    . VAGINAL HYSTERECTOMY     Family History:  Family History  Problem Relation Age of Onset  . Lung cancer Mother 57  .  Alcohol abuse Mother   . Throat cancer Father 17   Family Psychiatric  History: See admission H&P Social History:  Social History   Substance and Sexual Activity  Alcohol Use Yes  . Alcohol/week: 63.0 standard drinks  . Types: 63 Cans of beer per week   Comment: weekly "I have cut back"     Social History   Substance and Sexual Activity  Drug Use Yes  . Types: Marijuana    Social History   Socioeconomic History  . Marital status: Divorced    Spouse name: Not on file  . Number of children: Not on file  . Years of education: Not on file  . Highest education level: Not on file  Occupational  History  . Occupation: applying for disability  Social Needs  . Financial resource strain: Not on file  . Food insecurity:    Worry: Not on file    Inability: Not on file  . Transportation needs:    Medical: Not on file    Non-medical: Not on file  Tobacco Use  . Smoking status: Never Smoker  . Smokeless tobacco: Never Used  Substance and Sexual Activity  . Alcohol use: Yes    Alcohol/week: 63.0 standard drinks    Types: 63 Cans of beer per week    Comment: weekly "I have cut back"  . Drug use: Yes    Types: Marijuana  . Sexual activity: Not Currently  Lifestyle  . Physical activity:    Days per week: Not on file    Minutes per session: Not on file  . Stress: Not on file  Relationships  . Social connections:    Talks on phone: Not on file    Gets together: Not on file    Attends religious service: Not on file    Active member of club or organization: Not on file    Attends meetings of clubs or organizations: Not on file    Relationship status: Not on file  Other Topics Concern  . Not on file  Social History Narrative   She moved with a boyfriend to [place] and was followed at [facility].  He died of a massive heart attack in 8/18, per her report, and so she moved back to [place] and is living with a friend.   Additional Social History:                         Sleep: Fair  Appetite:  Fair  Current Medications: Current Facility-Administered Medications  Medication Dose Route Frequency Provider Last Rate Last Dose  . busPIRone (BUSPAR) tablet 10 mg  10 mg Oral BID Sharma Covert, MD   10 mg at 08/27/18 0737  . FLUoxetine (PROZAC) capsule 20 mg  20 mg Oral Daily Sharma Covert, MD   20 mg at 08/27/18 0737  . furosemide (LASIX) tablet 20 mg  20 mg Oral Daily Sharma Covert, MD   20 mg at 08/27/18 0737  . gabapentin (NEURONTIN) capsule 200 mg  200 mg Oral TID Sharma Covert, MD   200 mg at 08/27/18 0736  . haloperidol (HALDOL)  tablet 5 mg  5 mg Oral BID Sharma Covert, MD   5 mg at 08/27/18 0738  . hydrOXYzine (ATARAX/VISTARIL) tablet 25 mg  25 mg Oral Q6H PRN [place] Romp A, NP   25 mg at 08/27/18 0742  . ibuprofen (ADVIL,MOTRIN) tablet 400 mg  400 mg Oral Q6H PRN  Sharma Covert, MD      . lactulose [facility]) 10 GM/15ML solution 20 g  20 g Oral BID Sharma Covert, MD      . loperamide (IMODIUM) capsule 2-4 mg  2-4 mg Oral PRN Lind[place] Romp A, NP   4 mg at 08/26/18 3299  . LORazepam (ATIVAN) tablet 1 mg  1 mg Oral Q6H PRN Lindon Romp A, NP   1 mg at 08/25/18 2116  . multivitamin with minerals tablet 1 tablet  1 tablet Oral Daily Lindon Romp A, NP   1 tablet at 08/27/18 0738  . ondansetron (ZOFRAN-ODT) disintegrating tablet 4 mg  4 mg Oral Q6H PRN Lindon Romp A, NP      . pantoprazole (PROTONIX) EC tablet 40 mg  40 mg Oral Daily Sharma Covert, MD   40 mg at 08/27/18 2426  . spironolactone (ALDACTONE) tablet 50 mg  50 mg Oral Daily Sharma Covert, MD   50 mg at 08/27/18 0738  . thiamine (VITAMIN B-1) tablet 100 mg  100 mg Oral Daily Lindon Romp A, NP   100 mg at 08/27/18 0738  . topiramate (TOPAMAX) tablet 50 mg  50 mg Oral QHS Sharma Covert, MD   50 mg at 08/26/18 2140    Lab Results:  Results for orders placed or performed during the hospital encounter of 08/25/18 (from the past 48 hour(s))  Hepatic function panel     Status: Abnormal   Collection Time: 08/27/18  6:45 AM  Result Value Ref Range   Total Protein 6.9 6.5 - 8.1 g/dL   Albumin 2.8 (L) 3.5 - 5.0 g/dL   AST 52 (H) 15 - 41 U/L   ALT 37 0 - 44 U/L   Alkaline Phosphatase 93 38 - 126 U/L   Total Bilirubin 1.0 0.3 - 1.2 mg/dL   Bilirubin, Direct 0.2 0.0 - 0.2 mg/dL   Indirect Bilirubin 0.8 0.3 - 0.9 mg/dL    Comment: Performed at [facility], [place] [address], [place], [place] [address]  Ammonia     Status: Abnormal   Collection Time: 08/27/18  6:45 AM  Result Value Ref Range   Ammonia 62 (H) 9 - 35  umol/L    Comment: Performed at [facility], [place] [address], [place], [place] [address]  APTT     Status: None   Collection Time: 08/27/18  6:45 AM  Result Value Ref Range   aPTT 27 24 - 36 seconds    Comment: Performed at [facility], [place] [address] [place], [place] [address]  CBC with Differential/Platelet     Status: Abnormal   Collection Time: 08/27/18  6:45 AM  Result Value Ref Range   WBC 2.3 (L) 4.0 - 10.5 K/uL   RBC 4.35 3.87 - 5.11 MIL/uL   Hemoglobin 12.6 12.0 - 15.0 g/dL   HCT 40.6 36.0 - 46.0 %   MCV 93.3 80.0 - 100.0 fL   MCH 29.0 26.0 - 34.0 pg   MCHC 31.0 30.0 - 36.0 g/dL   RDW 19.5 (H) 11.5 - 15.5 %   Platelets 39 (L) 150 - 400 K/uL    Comment: Immature Platelet Fraction may be clinically indicated, consider ordering this additional test [id number]    nRBC 0.0 0.0 - 0.2 %   Neutrophils Relative % 38 %   Neutro Abs 0.9 (L) 1.7 - 7.7 K/uL   Lymphocytes Relative 41 %   Lymphs Abs 0.9 0.7 - 4.0  K/uL   Monocytes Relative 16 %   Monocytes Absolute 0.4 0.1 - 1.0 K/uL   Eosinophils Relative 5 %   Eosinophils Absolute 0.1 0.0 - 0.5 K/uL   Basophils Relative 0 %   Basophils Absolute 0.0 0.0 - 0.1 K/uL   WBC Morphology WHITE COUNT CONFIRMED ON SMEAR    Immature Granulocytes 0 %   Abs Immature Granulocytes 0.00 0.00 - 0.07 K/uL    Comment: Performed at [facility], [place] [address] [place], [place] [address]  Lipase, blood     Status: Abnormal   Collection Time: 08/27/18  6:45 AM  Result Value Ref Range   Lipase 54 (H) 11 - 51 U/L    Comment: Performed at [facility], [place] [address], [place], [place] [address]    Blood Alcohol level:  Lab Results  Component Value Date   ETH 218 (H) 08/22/2018   ETH 382 (HH) 58/05/9832    Metabolic Disorder Labs: Lab Results  Component Value Date   HGBA1C 4.9 10/21/2017   No results found for: PROLACTIN Lab Results  Component Value Date   CHOL  164 05/20/2018   TRIG 149 05/20/2018   HDL 71 05/20/2018   CHOLHDL 2.3 05/20/2018   LDLCALC 63 05/20/2018    Physical Findings: AIMS: Facial and Oral Movements Muscles of Facial Expression: None, normal Lips and Perioral Area: None, normal Jaw: None, normal Tongue: None, normal,Extremity Movements Upper (arms, wrists, hands, fingers): None, normal Lower (legs, knees, ankles, toes): None, normal, Trunk Movements Neck, shoulders, hips: None, normal, Overall Severity Severity of abnormal movements (highest score from questions above): None, normal Incapacitation due to abnormal movements: None, normal Patient's awareness of abnormal movements (rate only patient's report): No Awareness, Dental Status Current problems with teeth and/or dentures?: No Does patient usually wear dentures?: No  CIWA:  CIWA-Ar Total: 1 COWS:     Musculoskeletal: Strength & Muscle Tone: within normal limits Gait & Station: normal Patient leans: N/A  Psychiatric Specialty Exam: Physical Exam  Nursing note and vitals reviewed. Constitutional: She is oriented to person, place, and time. She appears well-developed and well-nourished.  HENT:  Head: Normocephalic and atraumatic.  Respiratory: Effort normal.  Neurological: She is alert and oriented to person, place, and time.    ROS  Blood pressure 97/79, pulse 93, temperature (!) 97.5 F (36.4 C), temperature source Oral, resp. rate 20, height 5' (1.524 m), weight 62.6 kg, SpO2 98 %.Body mass index is 26.95 kg/m.  General Appearance: Casual  Eye Contact:  Fair  Speech:  Normal Rate  Volume:  Normal  Mood:  Dysphoric  Affect:  Congruent  Thought Process:  Coherent and Descriptions of Associations: Intact  Orientation:  Full (Time, Place, and Person)  Thought Content:  Logical  Suicidal Thoughts:  No  Homicidal Thoughts:  No  Memory:  Immediate;   Fair Recent;   Fair Remote;   Fair  Judgement:  Impaired  Insight:  Lacking  Psychomotor Activity:   Decreased  Concentration:  Concentration: Fair and Attention Span: Fair  Recall:  [employer] of Knowledge:  Fair  Language:  Fair  Akathisia:  Negative  Handed:  Right  AIMS (if indicated):     Assets:  Desire for Improvement Housing Resilience  ADL's:  Intact  Cognition:  WNL  Sleep:  Number of Hours: 5     Treatment Plan Summary: Daily contact with patient to assess and evaluate symptoms and progress in treatment, Medication management and Plan :  Patient is seen and examined.  Patient is a 47 year old female with the above-stated past psychiatric history who was admitted to the psychiatric facility after being taken to the medical hospital after abusing Haldol, Xanax and other substances. Her cognition this morning is significantly improved.  Her ammonia has gone up, so I am going to increase her lactulose up to twice daily.  Her white blood cell count is low, and we discussed her hepatitis C as well as the possibility of getting an HIV.  She is agreed to both of those.  She does not appear to be having any withdrawal symptoms currently from alcohol and Xanax, and that is a good sign.  She denied suicidal ideation today.  She did not sleep great last night, but did sleep a great deal during the day yesterday.  We will keep an eye on this today. 1.  No change in buspirone 10 mg p.o. twice daily for anxiety 2.  No change in fluoxetine 20 mg p.o. daily for mood and anxiety. 3.  Continue Lasix 20 mg p.o. daily for ascites and fluid retention. 4.  Continue Neurontin 200 mg p.o. 3 times daily for anxiety and chronic pain issues. 5.  Continue Haldol 5 mg p.o. twice daily for psychotic symptoms. 6.  Increase lactulose to 20 g p.o. twice daily for increased ammonia. 7.  Continue PRN Ativan 1 mg every 6 hours PRN CIWA greater than 10. 8.  Continue spironolactone for ascites and fluid retention. 9.  Continue Topamax 50 mg p.o. nightly for seizure disorder. 10.  Get HIV and acute hepatitis  panel. 11.  Disposition planning in progress.  Sharma Covert, MD 08/27/2018, 10:19 AM

## 2018-08-27 NOTE — Progress Notes (Signed)
Patient self inventory- Patient slept fair last night, sleep medication was not requested. Appetite is good, energy level normal, concentration poor. Depression, hopelessness, and anxiety rated 5, 5, 5 out of 10. Patient endorses tremors, agitation, runny nose, chilling, and irritability. Denies SI HI AVH, Endorses physical probles such as dizziness, headaches, blurred vision, and pain. Pain is rated 4/10 in her back and feet. Patient's goal is to "think about my future and make a plan."  Patient is compliant with medications prescribed per provider. No side effects noted. Safety is maintained with 15 minute checks as well as environmental checks. Will continue to monitor.

## 2018-08-27 NOTE — Progress Notes (Signed)
The patient shared in group that she had rated her day as a 9 out of 10 since she had a good talk with her peers. Her goal for tomorrow is to deal with her constipation.

## 2018-08-28 DIAGNOSIS — F1014 Alcohol abuse with alcohol-induced mood disorder: Secondary | ICD-10-CM

## 2018-08-28 DIAGNOSIS — F209 Schizophrenia, unspecified: Secondary | ICD-10-CM

## 2018-08-28 DIAGNOSIS — F419 Anxiety disorder, unspecified: Secondary | ICD-10-CM

## 2018-08-28 NOTE — Plan of Care (Signed)
  Problem: Education: Goal: Emotional status will improve Outcome: Progressing   

## 2018-08-28 NOTE — Progress Notes (Signed)
[facility] MD Progress Note  08/28/2018 11:07 AM Makyra Corprew  MRN:  [id number]  Subjective: Arwyn reports, "I think I'm doing a lot better. The withdrawal symptoms are much less now. My depression & anxiety has bother remained the same. I will rate both my anxiety & depression to be at #5. This are usually my normal for depression & anxiety. I think I'm getting stable to go back to the shelter. I would not want to lose my bed at the shelter for staying out too long"  (Per admission evaluations): Patient is a 47 year old female who originally presented on emergency department on 12/14 after an intentional overdose of Xanax, Haldol and alcohol. In the emergency department she was unable to state what took place, but did list Xanax, gabapentin, Topamax as medication she had been taking. Her mentation was altered on admission. She has a known past psychiatric history significant for polysubstance dependence, alcohol abuse, cocaine abuse. She also has a history of liver cirrhosis, ascites, seizure, hepatitis C, esophageal varices, GI bleeding, GERD as well. The patient stated that she had been staying in a shelter somewhere in [place]. She stated that 1 of the other shelter mates found a place to stay with section 8 housing. This person invited them all to come with him. While at this new house they began to party because "when you are homeless it is the only thing you have available to you". She admitted to alcohol intake, cocaine intake. In the medical hospital she was hypotensive, lethargic and confused. Her blood pressure was listed as 77/57. She was given IV fluids and treated shortly with IV Levophed. She was seen by consult psychiatry on 12/15. It stated she was unable to contract for safety. On 12/17 it was stated that she was stable enough for discharge and transfer to the psychiatric facility. She appears to be somewhat encephalopathic and difficulty with orientation. She stated that she  had had a history of some months of sobriety, but was unable to say when. She stated she was on the Haldol for a reported history of schizophrenia.  Objective: Concha is seen, chart reviewed. The chart findings discussed with the treatment team.  She presents significantly better today, mentally & physically.  She is alert and oriented x 3.  She is up ambulating & is visible on the unit. She has hx of ascites & her ammonia has been elevated & white blood count remains in the 2 range. Previously, she followed-up with the attending psychiatrist. Karenna stated & informed the psychiatrist that she had been told in the past that her hepatitis C was "okay and not to worry about it".  She was also questioned about whether she had had an HIV test in the past and she denied this. The Md was a bit concerned given her leukopenia Hx. The Social work was contacted for this patient's previous living circumstances, and the shelter are more than willing to take her back after discharge.  She stated that she is looking forward to getting her disability approved, and having "money to be able to find some place to live". She was assisted in going through extensively, all the physical manifestations of her alcoholism and her risk for death from many of these things. She has minimal insight towards her alcohol issue.  She currently denies any auditory or visual hallucinations. She denies any SIHI, delusions or paranoia. She is tolerating her treatment regimen without any adverse effects or reactions reported. Rajah has agreed to continue current plan  of care as already in progress.  Principal Problem: Alcohol abuse with alcohol-induced mood disorder ([place])  Diagnosis: Principal Problem:   Alcohol abuse with alcohol-induced mood disorder ([place]) Active Problems:   Decompensated hepatic cirrhosis (HCC)   Alcohol withdrawal syndrome with complication (HCC)   Schizophrenia ([place])   Alcohol dependence ([place])  Total Time spent with  patient: 15 minutes  Past Psychiatric History: See H&P  Past Medical History:  Past Medical History:  Diagnosis Date  . Anxiety   . Bipolar affective disorder ([place])    With anxiety features  . Cirrhosis of liver ([place])    Due to alcohol and hepatitis C  . Depression   . GERD (gastroesophageal reflux disease)   . Hematemesis 02/10/2018  . Hepatitis C 2018   hepatitis c and alcohol related hepatitis  . History of blood transfusion    "blood doesn't clot; I fell down and had to have a transfusion"  . History of kidney stones   . Migraine    "when I get really stressed" (09/01/2017)  . Schizophrenia ([place])   . Seizures ([place])    "when I run out of my RX; lots recently" (09/01/2017)    Past Surgical History:  Procedure Laterality Date  . ESOPHAGOGASTRODUODENOSCOPY N/A 09/03/2017   Procedure: ESOPHAGOGASTRODUODENOSCOPY (EGD);  Surgeon: Doran Stabler, MD;  Location: [facility];  Service: Gastroenterology;  Laterality: N/A;  . FINGER FRACTURE SURGERY Left    "shattered my pinky"  . FRACTURE SURGERY    . IR PARACENTESIS  07/23/2017  . IR PARACENTESIS  07/2017   "did it twice in the same week" (09/01/2017)  . SHOULDER OPEN ROTATOR CUFF REPAIR Right   . TUBAL LIGATION    . VAGINAL HYSTERECTOMY     Family History:  Family History  Problem Relation Age of Onset  . Lung cancer Mother 78  . Alcohol abuse Mother   . Throat cancer Father 60   Family Psychiatric  History: See H&P  Social History:  Social History   Substance and Sexual Activity  Alcohol Use Yes  . Alcohol/week: 63.0 standard drinks  . Types: 63 Cans of beer per week   Comment: weekly "I have cut back"     Social History   Substance and Sexual Activity  Drug Use Yes  . Types: Marijuana    Social History   Socioeconomic History  . Marital status: Divorced    Spouse name: Not on file  . Number of children: Not on file  . Years of education: Not on file  . Highest education level: Not on file   Occupational History  . Occupation: applying for disability  Social Needs  . Financial resource strain: Not on file  . Food insecurity:    Worry: Not on file    Inability: Not on file  . Transportation needs:    Medical: Not on file    Non-medical: Not on file  Tobacco Use  . Smoking status: Never Smoker  . Smokeless tobacco: Never Used  Substance and Sexual Activity  . Alcohol use: Yes    Alcohol/week: 63.0 standard drinks    Types: 63 Cans of beer per week    Comment: weekly "I have cut back"  . Drug use: Yes    Types: Marijuana  . Sexual activity: Not Currently  Lifestyle  . Physical activity:    Days per week: Not on file    Minutes per session: Not on file  . Stress: Not on file  Relationships  .  Social connections:    Talks on phone: Not on file    Gets together: Not on file    Attends religious service: Not on file    Active member of club or organization: Not on file    Attends meetings of clubs or organizations: Not on file    Relationship status: Not on file  Other Topics Concern  . Not on file  Social History Narrative   She moved with a boyfriend to [place] and was followed at [facility].  He died of a massive heart attack in 8/18, per her report, and so she moved back to [place] and is living with a friend.   Additional Social History:   Sleep: Good  Appetite:  Good  Current Medications: Current Facility-Administered Medications  Medication Dose Route Frequency Provider Last Rate Last Dose  . busPIRone (BUSPAR) tablet 10 mg  10 mg Oral BID Sharma Covert, MD   10 mg at 08/28/18 0809  . FLUoxetine (PROZAC) capsule 20 mg  20 mg Oral Daily Sharma Covert, MD   20 mg at 08/28/18 0809  . furosemide (LASIX) tablet 20 mg  20 mg Oral Daily Sharma Covert, MD   20 mg at 08/28/18 0809  . gabapentin (NEURONTIN) capsule 200 mg  200 mg Oral TID Sharma Covert, MD   200 mg at 08/28/18 0809  . haloperidol (HALDOL) tablet 5 mg  5 mg Oral  BID Sharma Covert, MD   5 mg at 08/28/18 0810  . hydrOXYzine (ATARAX/VISTARIL) tablet 25 mg  25 mg Oral Q6H PRN Lindon Romp A, NP   25 mg at 08/28/18 6568  . ibuprofen (ADVIL,MOTRIN) tablet 400 mg  400 mg Oral Q6H PRN Sharma Covert, MD      . lactulose ([facility]) 10 GM/15ML solution 20 g  20 g Oral BID Sharma Covert, MD   20 g at 08/28/18 0809  . loperamide (IMODIUM) capsule 2-4 mg  2-4 mg Oral PRN Lindon Romp A, NP   4 mg at 08/26/18 1275  . LORazepam (ATIVAN) tablet 1 mg  1 mg Oral Q6H PRN Lindon Romp A, NP   1 mg at 08/25/18 2116  . multivitamin with minerals tablet 1 tablet  1 tablet Oral Daily Lindon Romp A, NP   1 tablet at 08/28/18 0810  . ondansetron (ZOFRAN-ODT) disintegrating tablet 4 mg  4 mg Oral Q6H PRN Lindon Romp A, NP      . pantoprazole (PROTONIX) EC tablet 40 mg  40 mg Oral Daily Sharma Covert, MD   40 mg at 08/28/18 0810  . spironolactone (ALDACTONE) tablet 50 mg  50 mg Oral Daily Sharma Covert, MD   50 mg at 08/28/18 0809  . thiamine (VITAMIN B-1) tablet 100 mg  100 mg Oral Daily Lindon Romp A, NP   100 mg at 08/28/18 0809  . topiramate (TOPAMAX) tablet 50 mg  50 mg Oral QHS Sharma Covert, MD   50 mg at 08/26/18 2140   Lab Results:  Results for orders placed or performed during the hospital encounter of 08/25/18 (from the past 48 hour(s))  Hepatic function panel     Status: Abnormal   Collection Time: 08/27/18  6:45 AM  Result Value Ref Range   Total Protein 6.9 6.5 - 8.1 g/dL   Albumin 2.8 (L) 3.5 - 5.0 g/dL   AST 52 (H) 15 - 41 U/L   ALT 37 0 - 44 U/L   Alkaline  Phosphatase 93 38 - 126 U/L   Total Bilirubin 1.0 0.3 - 1.2 mg/dL   Bilirubin, Direct 0.2 0.0 - 0.2 mg/dL   Indirect Bilirubin 0.8 0.3 - 0.9 mg/dL    Comment: Performed at [facility], [place] [address], [place], [place] [address]  Ammonia     Status: Abnormal   Collection Time: 08/27/18  6:45 AM  Result Value Ref Range   Ammonia 62 (H) 9 - 35 umol/L     Comment: Performed at [facility], [place] [address] [place], [place] [address]  APTT     Status: None   Collection Time: 08/27/18  6:45 AM  Result Value Ref Range   aPTT 27 24 - 36 seconds    Comment: Performed at [facility], [place] [address], [place], [place] [address]  CBC with Differential/Platelet     Status: Abnormal   Collection Time: 08/27/18  6:45 AM  Result Value Ref Range   WBC 2.3 (L) 4.0 - 10.5 K/uL   RBC 4.35 3.87 - 5.11 MIL/uL   Hemoglobin 12.6 12.0 - 15.0 g/dL   HCT 40.6 36.0 - 46.0 %   MCV 93.3 80.0 - 100.0 fL   MCH 29.0 26.0 - 34.0 pg   MCHC 31.0 30.0 - 36.0 g/dL   RDW 19.5 (H) 11.5 - 15.5 %   Platelets 39 (L) 150 - 400 K/uL    Comment: Immature Platelet Fraction may be clinically indicated, consider ordering this additional test [id number]    nRBC 0.0 0.0 - 0.2 %   Neutrophils Relative % 38 %   Neutro Abs 0.9 (L) 1.7 - 7.7 K/uL   Lymphocytes Relative 41 %   Lymphs Abs 0.9 0.7 - 4.0 K/uL   Monocytes Relative 16 %   Monocytes Absolute 0.4 0.1 - 1.0 K/uL   Eosinophils Relative 5 %   Eosinophils Absolute 0.1 0.0 - 0.5 K/uL   Basophils Relative 0 %   Basophils Absolute 0.0 0.0 - 0.1 K/uL   WBC Morphology WHITE COUNT CONFIRMED ON SMEAR    Immature Granulocytes 0 %   Abs Immature Granulocytes 0.00 0.00 - 0.07 K/uL    Comment: Performed at [facility], [place] [address] [name], [place] [address]  Lipase, blood     Status: Abnormal   Collection Time: 08/27/18  6:45 AM  Result Value Ref Range   Lipase 54 (H) 11 - 51 U/L    Comment: Performed at [facility], [place] [address], [place], [place] [address]   Blood Alcohol level:  Lab Results  Component Value Date   ETH 218 (H) 08/22/2018   ETH 382 (HH) 85/10/7739   Metabolic Disorder Labs: Lab Results  Component Value Date   HGBA1C 4.9 10/21/2017   No results found for: PROLACTIN Lab Results  Component Value Date   CHOL 164 05/20/2018    TRIG 149 05/20/2018   HDL 71 05/20/2018   CHOLHDL 2.3 05/20/2018   LDLCALC 63 05/20/2018   Physical Findings: AIMS: Facial and Oral Movements Muscles of Facial Expression: None, normal Lips and Perioral Area: None, normal Jaw: None, normal Tongue: None, normal,Extremity Movements Upper (arms, wrists, hands, fingers): None, normal Lower (legs, knees, ankles, toes): None, normal, Trunk Movements Neck, shoulders, hips: None, normal, Overall Severity Severity of abnormal movements (highest score from questions above): None, normal Incapacitation due to abnormal movements: None, normal Patient's awareness of abnormal movements (rate only patient's report): No Awareness, Dental Status Current problems with  teeth and/or dentures?: No Does patient usually wear dentures?: No  CIWA:  CIWA-Ar Total: 1 COWS:     Musculoskeletal: Strength & Muscle Tone: within normal limits Gait & Station: normal Patient leans: N/A  Psychiatric Specialty Exam: Physical Exam  Nursing note and vitals reviewed. Constitutional: She is oriented to person, place, and time. She appears well-developed and well-nourished.  HENT:  Head: Normocephalic and atraumatic.  Respiratory: Effort normal.  Neurological: She is alert and oriented to person, place, and time.    Review of Systems  Respiratory: Negative.  Negative for cough and shortness of breath.   Cardiovascular: Negative.  Negative for chest pain and palpitations.  Gastrointestinal: Negative.  Negative for abdominal pain, heartburn, nausea and vomiting.  Genitourinary: Negative.   Musculoskeletal: Negative.   Neurological: Negative.  Negative for dizziness.  Endo/Heme/Allergies: Negative.   Psychiatric/Behavioral: Positive for depression ("Improving, rates #5") and substance abuse (Hx. alcoholism, drug addiction (stable)). Negative for hallucinations, memory loss and suicidal ideas. The patient is nervous/anxious ("Improving, rates #5") and has insomnia  ("Improving").     Blood pressure (!) 97/56, pulse (!) 105, temperature 98 F (36.7 C), temperature source Oral, resp. rate 20, height 5' (1.524 m), weight 62.6 kg, SpO2 98 %.Body mass index is 26.95 kg/m.  General Appearance: Casual  Eye Contact:  Fair  Speech:  Normal Rate  Volume:  Normal  Mood:  Dysphoric  Affect:  Congruent  Thought Process:  Coherent and Descriptions of Associations: Intact  Orientation:  Full (Time, Place, and Person)  Thought Content:  Logical  Suicidal Thoughts:  No  Homicidal Thoughts:  No  Memory:  Immediate;   Fair Recent;   Fair Remote;   Fair  Judgement:  Impaired  Insight:  Lacking  Psychomotor Activity:  Decreased  Concentration:  Concentration: Fair and Attention Span: Fair  Recall:  [employer] of Knowledge:  Fair  Language:  Fair  Akathisia:  Negative  Handed:  Right  AIMS (if indicated):     Assets:  Desire for Improvement Housing Resilience  ADL's:  Intact  Cognition:  WNL  Sleep:  Number of Hours: 6.25   Treatment Plan Summary: Daily contact with patient to assess and evaluate symptoms and progress in treatment and Medication management.  - Continue inpatient hospitalization.  - Will continue today 08/28/2018 plan as below except where it is noted.  Anxiety.     - Continue buspirone 10 mg po bid.  Depression.     - Continue Fluoxetine 20 mg po daily.  Mood control.      - Continue Haldol 5 mg p.o. twice daily,  Agitation.     - Continue Neurontin 200 mg po tid daily.  Alcohol withdrawal symptoms.     - Continue PRN Ativan 1 mg every 6 hours PRN CIWA greater than 10    Other medical issues.     - Continue lactulose 20 g po twice daily for increased ammonia.     - Continue Lasix 20 mg po daily for ascites and fluid retention.     - Continue spironolactone for ascites and fluid retention.     - Continue Topamax 50 mg po nightly for seizure disord10.      - Obtain HIV & acute hepatitis panel, results pending.  -  Disposition planning in progress. - Patient to participate in the group milieu.  Lindell Spar, NP, PMHNP, FNP-BC. 08/28/2018, 11:07 AMPatient ID: Manfred Shirts, female   DOB: Mar 25, 1971, 47 y.o.   MRN: [id number]

## 2018-08-28 NOTE — Progress Notes (Signed)
Recreation Therapy Notes  Date: 12.20.19 Time: 0930 Location: 300 Hall Dayroom  Group Topic: Stress Management  Goal Area(s) Addresses:  Patient will verbalize importance of using healthy stress management.  Patient will identify positive emotions associated with healthy stress management.   Intervention: Stress Management  Activity :  Guided Imagery.  LRT introduced the stress management technique of guided imagery.  LRT read a script on sitting and observing the starry sky at night.  Patients were to follow along as script as read.  Education:  Stress Management, Discharge Planning.   Education Outcome: Acknowledges edcuation/In group clarification offered/Needs additional education  Clinical Observations/Feedback: Pt did not attend group.     Victorino Sparrow, LRT/CTRS         Victorino Sparrow A 08/28/2018 12:01 PM

## 2018-08-28 NOTE — Progress Notes (Signed)
  [facility] Adult Case Management Discharge Plan :  Will you be returning to the same living situation after discharge:  Yes,  Pt has bed at the [facility] at discharge At discharge, do you have transportation home?: Yes,  bus pass. PT IS SCHEDULED TO DISCHARGE ON SATURDAY, 12/21. SHE WILL [facility] TO [facility]. Do you have the ability to pay for your medications: Yes,  mental health  Release of information consent forms completed and submitted to medical records by CSW.   Patient to Follow up at: Follow-up Information    [facility] up.   Specialty:  Behavioral Health Why:  Your hospital follow up appointment is Monday, 09/07/18 at 8:00a. Please bring your photo ID, proof of insurance, social security c4rad, and discharge paperwork from this hospitalization. Contact information: [place] [address] [phone]           Next level of care provider has access to [facility] and Suicide Prevention discussed: Yes,  SPE completed with pt; pt declined to consent to collateral contact. SPI pamphlet and mobile crisis information provided to pt for additional support.   Have you used any form of tobacco in the last 30 days? (Cigarettes, Smokeless Tobacco, Cigars, and/or Pipes): No  Has patient been referred to the Quitline?: N/A patient is not a smoker  Patient has been referred for addiction treatment: Yes  Avelina Laine, LCSW 08/28/2018, 9:09 AM

## 2018-08-28 NOTE — Progress Notes (Signed)
D Pt is observed OOb UAL on the 300 hall today she tolerates this fair. Her affect is flat, she is slightly disorganized and her speech can ramble sometimes, but she eventually gets back on track.     A She completed her daily assessment and on this she wrote she denied having SI today and she rated her depression, hopelessness and anxiety " 5/8/8/", respectively.     R Safety is in place and poc cont. Possible dc tomorrow depending on how well pt tolertes treatment today.

## 2018-08-28 NOTE — Progress Notes (Signed)
Nursing Progress Note: 7p-7a D: Pt currently presents with a anxious/pleasant/tremulous affect and behavior. Interacting appropriately with the milieu. Pt reports good sleep during the previous night with current medication regimen. Pt did  attend wrap-up group.  A: Pt provided with medications per providers orders. Pt's labs and vitals were monitored throughout the night. Pt supported emotionally and encouraged to express concerns and questions. Pt educated on medications.  R: Pt's safety ensured with 15 minute and environmental checks. Pt currently denies SI, HI, and AVH. Pt verbally contracts to seek staff if SI,HI, or AVH occurs and to consult with staff before acting on any harmful thoughts. Will continue to monitor.

## 2018-08-29 DIAGNOSIS — F600 Paranoid personality disorder: Secondary | ICD-10-CM

## 2018-08-29 DIAGNOSIS — F1924 Other psychoactive substance dependence with psychoactive substance-induced mood disorder: Secondary | ICD-10-CM

## 2018-08-29 DIAGNOSIS — F1024 Alcohol dependence with alcohol-induced mood disorder: Principal | ICD-10-CM

## 2018-08-29 LAB — HEPATITIS PANEL, ACUTE
HCV Ab: 8.5 s/co ratio — ABNORMAL HIGH (ref 0.0–0.9)
Hep A IgM: NEGATIVE
Hep B C IgM: NEGATIVE
Hepatitis B Surface Ag: NEGATIVE

## 2018-08-29 MED ORDER — FOLIC ACID 1 MG PO TABS
1.0000 mg | ORAL_TABLET | Freq: Every day | ORAL | Status: DC
  Filled 2018-08-29 (×2): qty 7

## 2018-08-29 MED ORDER — HALOPERIDOL 5 MG PO TABS: 5.0000 mg | ORAL_TABLET | Freq: Two times a day (BID) | ORAL | 0 refills | Status: DC

## 2018-08-29 MED ORDER — FLUOXETINE HCL 20 MG PO CAPS: 20.0000 mg | ORAL_CAPSULE | Freq: Every day | ORAL | 0 refills | Status: DC

## 2018-08-29 NOTE — Progress Notes (Signed)
Patient ID: Briana Sweeney, female   DOB: [birth year], 47 y.o.   MRN: [id number] Patient discharged to home/self care in the on her accord.  Patient denies SI, HI and AVH upon discharge.  Patient acknowledged understanding of all discharge instructions and receipt of personal belongings.

## 2018-08-29 NOTE — Progress Notes (Signed)
D.  Pt in bed from beginning of shift, did not get up to attend evening AA group.  Respirations even and unlabored, no discomfort noted.  A.  Will continue to monitor.  R. Pt remains safe on the unit.

## 2018-08-29 NOTE — Discharge Summary (Signed)
Physician Discharge Summary Note  Patient:  Briana Sweeney is an 47 y.o., female MRN:  [id number] DOB:  12-Jan-1971 Patient phone:  [phone] (home)  Patient address:   [place] [address],  Total Time spent with patient: 15 minutes  Date of Admission:  08/25/2018 Date of Discharge: 08/29/2018  Reason for Admission:  Per assessment note on admission:  Patient is a 47 year old female who originally presented on emergency department on 12/14 after an intentional overdose of Xanax, Haldol and alcohol. In the emergency department she was unable to state what took place, but did list Xanax, gabapentin, Topamax as medication she had been taking. Her mentation was altered on admission. She has a known past psychiatric history significant for polysubstance dependence, alcohol abuse, cocaine abuse. She also has a history of liver cirrhosis, ascites, seizure, hepatitis C, esophageal varices, GI bleeding, GERD as well. The patient stated that she had been staying in a shelter somewhere in [place]. She stated that 1 of the other shelter mates found a place to stay with section 8 housing. This person invited them all to come with him. While at this new house they began to party because "when you are homeless it is the only thing you have available to you".   She admitted to alcohol intake, cocaine intake. In the medical hospital she was hypotensive, lethargic and confused. Her blood pressure was listed as 77/57. She was given IV fluids and treated shortly with IV Levophed. She was seen by consult psychiatry on 12/15. It stated she was unable to contract for safety. On 12/17 it was stated that she was stable enough for discharge and transfer to the psychiatric facility. She appears to be somewhat encephalopathic and difficulty with orientation. She stated that she had had a history of some months of sobriety, but was unable to say when. She stated she was on the Haldol for a reported  history of schizophrenia. Her blood alcohol on admission was 218, her drug screen was positive for benzodiazepines as well as cocaine. On 12/16 her AST was mildly elevated at 65, her ALT is stable at 37. Her total protein was 6.7. Her ammonia was 22. 3 months ago it was 102. Her lipase was elevated at 67. To the hospital for evaluation and stabilization  Principal Problem: Alcohol abuse with alcohol-induced mood disorder [facility]) Discharge Diagnoses: Principal Problem:   Alcohol abuse with alcohol-induced mood disorder ([place]) Active Problems:   Decompensated hepatic cirrhosis (HCC)   Alcohol withdrawal syndrome with complication ([place])   Schizophrenia ([place])   Alcohol dependence ([place])   Past Psychiatric History:  Past Medical History:  Past Medical History:  Diagnosis Date  . Anxiety   . Bipolar affective disorder ([place])    With anxiety features  . Cirrhosis of liver ([place])    Due to alcohol and hepatitis C  . Depression   . GERD (gastroesophageal reflux disease)   . Hematemesis 02/10/2018  . Hepatitis C 2018   hepatitis c and alcohol related hepatitis  . History of blood transfusion    "blood doesn't clot; I fell down and had to have a transfusion"  . History of kidney stones   . Migraine    "when I get really stressed" (09/01/2017)  . Schizophrenia ([place])   . Seizures ([place])    "when I run out of my RX; lots recently" (09/01/2017)    Past Surgical History:  Procedure Laterality Date  . ESOPHAGOGASTRODUODENOSCOPY N/A 09/03/2017   Procedure: ESOPHAGOGASTRODUODENOSCOPY (EGD);  Surgeon: Nelida Meuse  III, MD;  Location: [facility];  Service: Gastroenterology;  Laterality: N/A;  . FINGER FRACTURE SURGERY Left    "shattered my pinky"  . FRACTURE SURGERY    . IR PARACENTESIS  07/23/2017  . IR PARACENTESIS  07/2017   "did it twice in the same week" (09/01/2017)  . SHOULDER OPEN ROTATOR CUFF REPAIR Right   . TUBAL LIGATION    . VAGINAL HYSTERECTOMY     Family History:   Family History  Problem Relation Age of Onset  . Lung cancer Mother 81  . Alcohol abuse Mother   . Throat cancer Father 42   Family Psychiatric  History:  Social History:  Social History   Substance and Sexual Activity  Alcohol Use Yes  . Alcohol/week: 63.0 standard drinks  . Types: 63 Cans of beer per week   Comment: weekly "I have cut back"     Social History   Substance and Sexual Activity  Drug Use Yes  . Types: Marijuana    Social History   Socioeconomic History  . Marital status: Divorced    Spouse name: Not on file  . Number of children: Not on file  . Years of education: Not on file  . Highest education level: Not on file  Occupational History  . Occupation: applying for disability  Social Needs  . Financial resource strain: Not on file  . Food insecurity:    Worry: Not on file    Inability: Not on file  . Transportation needs:    Medical: Not on file    Non-medical: Not on file  Tobacco Use  . Smoking status: Never Smoker  . Smokeless tobacco: Never Used  Substance and Sexual Activity  . Alcohol use: Yes    Alcohol/week: 63.0 standard drinks    Types: 63 Cans of beer per week    Comment: weekly "I have cut back"  . Drug use: Yes    Types: Marijuana  . Sexual activity: Not Currently  Lifestyle  . Physical activity:    Days per week: Not on file    Minutes per session: Not on file  . Stress: Not on file  Relationships  . Social connections:    Talks on phone: Not on file    Gets together: Not on file    Attends religious service: Not on file    Active member of club or organization: Not on file    Attends meetings of clubs or organizations: Not on file    Relationship status: Not on file  Other Topics Concern  . Not on file  Social History Narrative   She moved with a boyfriend to [place] and was [facility].  He died of a massive heart attack in 8/18, per her report, and so she moved back to [place] and is living  with a friend.    Hospital Course:  [name] was admitted for Alcohol abuse with alcohol-induced mood disorder (HCC)and crisis management.  Pt was treated discharged with the medications listed below under Medication List.  Medical problems were identified and treated as needed.  Home medications were restarted as appropriate.  Improvement was monitored by observation and Manfred Shirts 's daily report of symptom reduction.  Emotional and mental status was monitored by daily self-inventory reports completed by Manfred Shirts and clinical staff.         Tawnie Ehresman was evaluated by the treatment team for stability and plans for continued recovery upon discharge. Nhu Glasby 's motivation  was an integral factor for scheduling further treatment. Employment, transportation, bed availability, health status, family support, and any pending legal issues were also considered during hospital stay. Pt was offered further treatment options upon discharge including but not limited to Residential, Intensive Outpatient, and Outpatient treatment.  Roger Fasnacht will follow up with the services as listed below under Follow Up Information.     Upon completion of this admission the patient was both mentally and medically stable for discharge denying suicidal or homicidal ideation. Keep follow apportionment with primary care provider (PCP) for abnormal labs: ammonia has been elevated & CBC: white blood.  Manfred Shirts responded well to treatment with Haldol 5 mg BID, Bupar 10 mg BID and Prozac 20 mg without adverse effects.. Pt demonstrated improvement without reported or observed adverse effects to the point of stability appropriate for outpatient management. Pertinent labs include: Hepatitis Panel and Ammonia, CBC, CMP   which outpatient follow-up is necessary for lab recheck as mentioned below. Reviewed CBC, CMP, BAL, and UDS; all unremarkable aside from noted exceptions.   Physical Findings: AIMS: Facial and Oral  Movements Muscles of Facial Expression: None, normal Lips and Perioral Area: None, normal Jaw: None, normal Tongue: None, normal,Extremity Movements Upper (arms, wrists, hands, fingers): None, normal Lower (legs, knees, ankles, toes): None, normal, Trunk Movements Neck, shoulders, hips: None, normal, Overall Severity Severity of abnormal movements (highest score from questions above): None, normal Incapacitation due to abnormal movements: None, normal Patient's awareness of abnormal movements (rate only patient's report): No Awareness, Dental Status Current problems with teeth and/or dentures?: No Does patient usually wear dentures?: No  CIWA:  CIWA-Ar Total: 2 COWS:     Musculoskeletal: Strength & Muscle Tone: within normal limits Gait & Station: normal Patient leans: N/A  Psychiatric Specialty Exam: See SRA by MD Physical Exam  Constitutional: She appears well-developed.    Review of Systems  Psychiatric/Behavioral: Negative for depression (stable) and suicidal ideas. The patient is not nervous/anxious.   All other systems reviewed and are negative.   Blood pressure 101/73, pulse (!) 109, temperature 98.1 F (36.7 C), temperature source Oral, resp. rate 20, height 5' (1.524 m), weight 62.6 kg, SpO2 99 %.Body mass index is 26.95 kg/m.   Have you used any form of tobacco in the last 30 days? (Cigarettes, Smokeless Tobacco, Cigars, and/or Pipes): No  Has this patient used any form of tobacco in the last 30 days? (Cigarettes, Smokeless Tobacco, Cigars, and/or Pipes)  No  Blood Alcohol level:  Lab Results  Component Value Date   ETH 218 (H) 08/22/2018   ETH 382 (HH) 54/62/7035    Metabolic Disorder Labs:  Lab Results  Component Value Date   HGBA1C 4.9 10/21/2017   No results found for: PROLACTIN Lab Results  Component Value Date   CHOL 164 05/20/2018   TRIG 149 05/20/2018   HDL 71 05/20/2018   CHOLHDL 2.3 05/20/2018   LDLCALC 63 05/20/2018    See Psychiatric  Specialty Exam and Suicide Risk Assessment completed by Attending Physician prior to discharge.  Discharge destination:  Home  Is patient on multiple antipsychotic therapies at discharge:  No   Has Patient had three or more failed trials of antipsychotic monotherapy by history:  No  Recommended Plan for Multiple Antipsychotic Therapies: NA  Discharge Instructions    Diet - low sodium heart healthy   Complete by:  As directed    Discharge instructions   Complete by:  As directed    Take all medications as  prescribed. Keep all follow-up appointments as scheduled.  Do not consume alcohol or use illegal drugs while on prescription medications. Report any adverse effects from your medications to your primary care provider promptly.  In the event of recurrent symptoms or worsening symptoms, call 911, a crisis hotline, or go to the nearest emergency department for evaluation.   Increase activity slowly   Complete by:  As directed      Allergies as of 08/29/2018   No Known Allergies     Medication List    STOP taking these medications   FLUoxetine 20 MG tablet Commonly known as:  PROZAC Replaced by:  FLUoxetine 20 MG capsule   naproxen sodium 220 MG tablet Commonly known as:  ALEVE     TAKE these medications     Indication  busPIRone 10 MG tablet Commonly known as:  BUSPAR Take 1 tablet (10 mg total) by mouth 2 (two) times daily.  Indication:  Anxiety Disorder, Major Depressive Disorder   FLUoxetine 20 MG capsule Commonly known as:  PROZAC Take 1 capsule (20 mg total) by mouth daily. Replaces:  FLUoxetine 20 MG tablet  Indication:  Depression   folic acid 1 MG tablet Commonly known as:  FOLVITE Take 1 tablet (1 mg total) by mouth daily.  Indication:  Anemia From Inadequate Folic Acid   furosemide 20 MG tablet Commonly known as:  LASIX Take 1 tablet (20 mg total) by mouth daily.  Indication:  per PCP   gabapentin 100 MG capsule Commonly known as:  NEURONTIN Take  2 capsules (200 mg total) by mouth 3 (three) times daily.  Indication:  Abuse or Misuse of Alcohol, Alcohol Withdrawal Syndrome   haloperidol 5 MG tablet Commonly known as:  HALDOL Take 1 tablet (5 mg total) by mouth 2 (two) times daily.  Indication:  mood stablization   lactulose 10 GM/15ML solution Commonly known as:  CHRONULAC Take 30 mLs (20 g total) by mouth daily.  Indication:  Chronic Constipation, Impaired Brain Function due to Liver Disease   multivitamin with minerals Tabs tablet Take 1 tablet by mouth daily.  Indication:  mul vit   spironolactone 50 MG tablet Commonly known as:  ALDACTONE Take 1 tablet (50 mg total) by mouth daily.  Indication:  Hardening of the Liver   thiamine 100 MG tablet Take 1 tablet (100 mg total) by mouth daily.  Indication:  Deficiency of Vitamin B1   topiramate 50 MG tablet Commonly known as:  TOPAMAX Take 1 tablet (50 mg total) by mouth daily.  Indication:  Seizure      Follow-up Information    [facility] Follow up.   Specialty:  Behavioral Health Why:  Your hospital follow up appointment is Monday, 09/07/18 at 8:00a. Please bring your photo ID, proof of insurance, social security c4rad, and discharge paperwork from this hospitalization. Contact information: [place] [address] [phone]           Follow-up recommendations:  Activity:  as tolerated Diet:  heart healthy  Comments:  Take all medications as prescribed. Keep all follow-up appointments as scheduled.  Do not consume alcohol or use illegal drugs while on prescription medications. Report any adverse effects from your medications to your primary care provider promptly.  In the event of recurrent symptoms or worsening symptoms, call 911, a crisis hotline, or go to the nearest emergency department for evaluation.   Signed: Derrill Center, NP 08/29/2018, 12:19 PM

## 2018-08-29 NOTE — BHH Suicide Risk Assessment (Signed)
[facility] Discharge Suicide Risk Assessment   Principal Problem: Alcohol abuse with alcohol-induced mood disorder [facility]) Discharge Diagnoses: Principal Problem:   Alcohol abuse with alcohol-induced mood disorder ([place]) Active Problems:   Decompensated hepatic cirrhosis (HCC)   Alcohol withdrawal syndrome with complication ([place])   Schizophrenia ([place])   Alcohol dependence ([place])   Total Time spent with patient: 15 minutes  Musculoskeletal: Strength & Muscle Tone: within normal limits Gait & Station: normal Patient leans: N/A  Psychiatric Specialty Exam: Review of Systems  All other systems reviewed and are negative.   Blood pressure 101/73, pulse (!) 109, temperature 98.1 F (36.7 C), temperature source Oral, resp. rate 20, height 5' (1.524 m), weight 62.6 kg, SpO2 99 %.Body mass index is 26.95 kg/m.  General Appearance: Casual  Eye Contact::  Fair  Speech:  Normal Rate409  Volume:  Normal  Mood:  Anxious  Affect:  Congruent  Thought Process:  Coherent and Descriptions of Associations: Intact  Orientation:  Full (Time, Place, and Person)  Thought Content:  Logical  Suicidal Thoughts:  No  Homicidal Thoughts:  No  Memory:  Immediate;   Fair Recent;   Fair Remote;   Fair  Judgement:  Intact  Insight:  Fair  Psychomotor Activity:  Normal  Concentration:  Fair  Recall:  [employer] of Knowledge:Fair  Language: Fair  Akathisia:  Negative  Handed:  Right  AIMS (if indicated):     Assets:  Desire for Improvement Resilience  Sleep:  Number of Hours: 6.75  Cognition: WNL  ADL's:  Intact   Mental Status Per Nursing Assessment::   On Admission:  NA  Demographic Factors:  Divorced or widowed, Caucasian, Low socioeconomic status and Unemployed  Loss Factors: NA  Historical Factors: Impulsivity  Risk Reduction Factors:   NA  Continued Clinical Symptoms:  Alcohol/Substance Abuse/Dependencies Schizophrenia:   Paranoid or undifferentiated type  Cognitive Features That  Contribute To Risk:  None    Suicide Risk:  Minimal: No identifiable suicidal ideation.  Patients presenting with no risk factors but with morbid ruminations; may be classified as minimal risk based on the severity of the depressive symptoms  Follow-up Information    [facility] Follow up.   Specialty:  Behavioral Health Why:  Your hospital follow up appointment is Monday, 09/07/18 at 8:00a. Please bring your photo ID, proof of insurance, social security [id number], and discharge paperwork from this hospitalization. Contact information: [place] [address] [phone]           Plan Of Care/Follow-up recommendations:  Activity:  ad lib  Sharma Covert, MD 08/29/2018, 7:48 AM

## 2018-08-29 NOTE — BHH Group Notes (Signed)
[facility] Group Notes: (Clinical Social Work)   08/29/2018      Type of Therapy:  Group Therapy   Participation Level:  Did Not Attend despite MHT prompting   Selmer Dominion, LCSW 08/29/2018, 1:46 PM

## 2018-08-30 NOTE — ED Notes (Signed)
Pt returned to obtain her home meds, verified they are in pharmacy, Pt verified by [place] [facility] prior to meds being returned to her.

## 2018-08-31 LAB — HIV-1 RNA, QUALITATIVE, TMA: HIV-1 RNA, Qualitative, TMA: NEGATIVE

## 2018-09-06 ENCOUNTER — Emergency Department (HOSPITAL_COMMUNITY): Payer: Medicaid Other

## 2018-09-06 ENCOUNTER — Emergency Department (HOSPITAL_COMMUNITY)
Admission: EM | Admit: 2018-09-06 | Discharge: 2018-09-06 | Disposition: A | Discharge status: Home / Self Care | Payer: Medicaid Other | Attending: Emergency Medicine | Admitting: Emergency Medicine

## 2018-09-06 ENCOUNTER — Other Ambulatory Visit: Payer: Self-pay

## 2018-09-06 ENCOUNTER — Encounter (HOSPITAL_COMMUNITY): Payer: Self-pay

## 2018-09-06 DIAGNOSIS — Y939 Activity, unspecified: Secondary | ICD-10-CM | POA: Insufficient documentation

## 2018-09-06 DIAGNOSIS — Y999 Unspecified external cause status: Secondary | ICD-10-CM | POA: Diagnosis not present

## 2018-09-06 DIAGNOSIS — S0083XA Contusion of other part of head, initial encounter: Secondary | ICD-10-CM | POA: Insufficient documentation

## 2018-09-06 DIAGNOSIS — Z590 Homelessness: Secondary | ICD-10-CM | POA: Insufficient documentation

## 2018-09-06 DIAGNOSIS — S0990XA Unspecified injury of head, initial encounter: Secondary | ICD-10-CM | POA: Diagnosis present

## 2018-09-06 DIAGNOSIS — Z79899 Other long term (current) drug therapy: Secondary | ICD-10-CM | POA: Diagnosis not present

## 2018-09-06 DIAGNOSIS — Y92099 Unspecified place in other non-institutional residence as the place of occurrence of the external cause: Secondary | ICD-10-CM | POA: Diagnosis not present

## 2018-09-06 DIAGNOSIS — T07XXXA Unspecified multiple injuries, initial encounter: Secondary | ICD-10-CM

## 2018-09-06 DIAGNOSIS — S0081XA Abrasion of other part of head, initial encounter: Secondary | ICD-10-CM | POA: Diagnosis not present

## 2018-09-06 HISTORY — DX: Opioid abuse, uncomplicated: F11.10

## 2018-09-06 HISTORY — DX: Alcohol dependence, uncomplicated: F10.20
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
## 2018-09-06 NOTE — ED Notes (Signed)
Patient using restroom with NT at side providing urine sample.
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
## 2018-09-07 ENCOUNTER — Other Ambulatory Visit: Payer: Self-pay | Admitting: Family Medicine

## 2018-09-07 DIAGNOSIS — F419 Anxiety disorder, unspecified: Secondary | ICD-10-CM

## 2018-09-07 MED FILL — FLUoxetine HCL 20 MG TABS: 20 | 30 days supply | Qty: 30 | Fill #2

## 2018-09-07 MED FILL — GABAPENTIN 100 MG CAPSULE: 100 | 30 days supply | Qty: 180 | Fill #3

## 2018-09-07 MED FILL — FUROSEMIDE 20 MG TABLET: 20 | 30 days supply | Qty: 30 | Fill #2

## 2018-09-07 MED FILL — hydrOXYzine HCL 25 MG TABS: 25 | 30 days supply | Qty: 90 | Fill #0

## 2018-09-07 MED FILL — SPIRONOLACTONE 50 MG TABLET: 50 | 30 days supply | Qty: 30 | Fill #2

## 2018-09-07 MED FILL — TOPIRAMATE 50 MG TABLET: 50 | 30 days supply | Qty: 30 | Fill #2

## 2018-09-10 ENCOUNTER — Telehealth: Payer: Self-pay

## 2018-09-10 NOTE — Telephone Encounter (Signed)
Pharmacy requesting refill on Trazodone but don't see it on medication list.

## 2018-09-11 ENCOUNTER — Other Ambulatory Visit: Payer: Self-pay | Admitting: Family Medicine

## 2018-09-11 ENCOUNTER — Other Ambulatory Visit: Payer: Self-pay

## 2018-09-11 ENCOUNTER — Encounter (HOSPITAL_COMMUNITY): Payer: Self-pay

## 2018-09-11 ENCOUNTER — Emergency Department (HOSPITAL_COMMUNITY)
Admission: EM | Admit: 2018-09-11 | Discharge: 2018-09-12 | Disposition: A | Discharge status: Home / Self Care | Payer: PRIVATE HEALTH INSURANCE | Attending: Emergency Medicine | Admitting: Emergency Medicine

## 2018-09-11 ENCOUNTER — Emergency Department (HOSPITAL_COMMUNITY): Payer: PRIVATE HEALTH INSURANCE

## 2018-09-11 DIAGNOSIS — G4700 Insomnia, unspecified: Secondary | ICD-10-CM

## 2018-09-11 DIAGNOSIS — R55 Syncope and collapse: Secondary | ICD-10-CM | POA: Insufficient documentation

## 2018-09-11 DIAGNOSIS — F10229 Alcohol dependence with intoxication, unspecified: Secondary | ICD-10-CM | POA: Diagnosis not present

## 2018-09-11 DIAGNOSIS — F10920 Alcohol use, unspecified with intoxication, uncomplicated: Secondary | ICD-10-CM

## 2018-09-11 DIAGNOSIS — Z79899 Other long term (current) drug therapy: Secondary | ICD-10-CM | POA: Diagnosis not present

## 2018-09-11 DIAGNOSIS — F10929 Alcohol use, unspecified with intoxication, unspecified: Secondary | ICD-10-CM | POA: Insufficient documentation

## 2018-09-11 LAB — CBC
HCT: 43.3 % (ref 36.0–46.0)
Hemoglobin: 13.6 g/dL (ref 12.0–15.0)
MCH: 28.2 pg (ref 26.0–34.0)
MCHC: 31.4 g/dL (ref 30.0–36.0)
MCV: 89.6 fL (ref 80.0–100.0)
Platelets: 50 10*3/uL — ABNORMAL LOW (ref 150–400)
RBC: 4.83 MIL/uL (ref 3.87–5.11)
RDW: 19.9 % — ABNORMAL HIGH (ref 11.5–15.5)
WBC: 5.5 10*3/uL (ref 4.0–10.5)
nRBC: 0 % (ref 0.0–0.2)

## 2018-09-11 LAB — I-STAT BETA HCG BLOOD, ED (MC, WL, AP ONLY): I-stat hCG, quantitative: <5 m[IU]/mL (ref <5)

## 2018-09-11 LAB — COMPREHENSIVE METABOLIC PANEL
ALT: 34 U/L (ref 0–44)
AST: 47 U/L — ABNORMAL HIGH (ref 15–41)
Albumin: 3.2 g/dL — ABNORMAL LOW (ref 3.5–5.0)
Alkaline Phosphatase: 106 U/L (ref 38–126)
Anion gap: 10 (ref 5–15)
BUN: 6 mg/dL (ref 6–20)
CO2: 20 mmol/L — ABNORMAL LOW (ref 22–32)
Calcium: 8.5 mg/dL — ABNORMAL LOW (ref 8.9–10.3)
Chloride: 110 mmol/L (ref 98–111)
Creatinine, Ser: 0.69 mg/dL (ref 0.44–1.00)
GFR calc Af Amer: >60 mL/min (ref >60)
GFR calc non Af Amer: >60 mL/min (ref >60)
Glucose, Bld: 107 mg/dL — ABNORMAL HIGH (ref 70–99)
Potassium: 4 mmol/L (ref 3.5–5.1)
Sodium: 140 mmol/L (ref 135–145)
Total Bilirubin: 0.6 mg/dL (ref 0.3–1.2)
Total Protein: 7.8 g/dL (ref 6.5–8.1)

## 2018-09-11 LAB — ETHANOL: Alcohol, Ethyl (B): 277 mg/dL — ABNORMAL HIGH (ref <10)

## 2018-09-11 MED ORDER — SODIUM CHLORIDE 0.9 % IV SOLN
1000.0000 mg | Freq: Once | INTRAVENOUS | Status: AC
  Administered 2018-09-11: 1000 mg via INTRAVENOUS
  Filled 2018-09-11: qty 20

## 2018-09-11 MED ORDER — TRAZODONE HCL 50 MG PO TABS: 50.0000 mg | ORAL_TABLET | Freq: Every evening | ORAL | 0 refills | Status: DC | PRN

## 2018-09-11 MED ORDER — SODIUM CHLORIDE 0.9 % IV BOLUS
1000.0000 mL | Freq: Once | INTRAVENOUS | Status: AC
  Administered 2018-09-11: 1000 mL via INTRAVENOUS

## 2018-09-11 NOTE — ED Notes (Signed)
Bed: [facility] Expected date:  Expected time:  Means of arrival:  Comments: 
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
## 2018-09-11 NOTE — ED Notes (Signed)
Started dilantin as ordered. After 4 mins patient states that this is the burning one and she doesn't take it. I offered to lower the rate. She states that she does not want it at all. Pt also continues to refuse the cardiac monitor.

## 2018-09-11 NOTE — ED Provider Notes (Signed)
[facility] DEPT Provider Note   CSN: [id number] Arrival date & time: 09/11/18  1941     History   Chief Complaint Chief Complaint  Patient presents with  . Loss of Consciousness  . Alcohol Intoxication    HPI Briana Sweeney is a 48 y.o. female.  Briana Sweeney is a 48 y.o. female with a history of alcohol abuse and liver cirrhosis, seizures, schizophrenia, bipolar disorder, who presents to the emergency department via EMS from [place] after a possible syncopal episode.  Per EMS patient became lightheaded and started to fall and staff members lowered her to the ground so she did not hit her head.  Patient is not sure what made her pass out.  She does endorse alcohol use prior to arrival and appears intoxicated.  She denies any chest pain or shortness of breath.  Does report that she had a head injury a few days ago on 12/29 and since then has had some intermittent headaches and difficulty remembering things, she was diagnosed with a concussion.  She also reports some continued pain over her left elbow which she hit during that fall.  Patient is unsure if she had a seizure, no seizure-like activity witnessed by EMS or bystanders on scene but patient reports she has not taken her Dilantin in over 4 weeks.     Past Medical History:  Diagnosis Date  . Anxiety   . Bipolar affective disorder ([place])    With anxiety features  . Cirrhosis of liver ([place])    Due to alcohol and hepatitis C  . Depression   . ETOHism ([place])   . GERD (gastroesophageal reflux disease)   . Hematemesis 02/10/2018  . Hepatitis C 2018   hepatitis c and alcohol related hepatitis  . Heroin abuse ([place])   . History of blood transfusion    "blood doesn't clot; I fell down and had to have a transfusion"  . History of kidney stones   . Migraine    "when I get really stressed" (09/01/2017)  . Schizophrenia ([place])   . Seizures ([place])    "when I run out of my RX; lots  recently" (09/01/2017)    Patient Active Problem List   Diagnosis Date Noted  . Acute metabolic encephalopathy 23/76/2831  . Overdose 08/22/2018  . Hypotension 08/22/2018  . Seizure ([place]) 08/22/2018  . Upper GI bleed 02/10/2018  . Alcohol withdrawal, with unspecified complication ([place]) 51/76/1607  . Chronic anemia   . Thrombocytopenia due to drugs   . Suicidal ideation   . Thrombocytopenia ([place]) 01/02/2018  . GERD (gastroesophageal reflux disease) 11/01/2017  . Trichimoniasis 10/30/2017  . Alcohol dependence ([place]) 10/29/2017  . MDD (major depressive disorder), severe ([place]) 10/28/2017  . Acute hyperactive alcohol withdrawal delirium ([place]) 10/09/2017  . Schizophrenia ([place]) 10/09/2017  . Alcohol abuse with alcohol-induced mood disorder ([place])   . Alcohol withdrawal syndrome with complication ([place]) 37/06/6268  . Esophageal varices without bleeding ([place])   . Hematemesis 09/01/2017  . Ascites due to alcoholic cirrhosis ([place])   . Decompensated hepatic cirrhosis ([place]) 07/23/2017  . Alcohol abuse 07/23/2017  . Hepatitis C antibody positive in blood 07/23/2017  . Hypokalemia 07/23/2017  . Jaundice 07/23/2017  . Coagulopathy ([place]) 07/23/2017  . Hypomagnesemia 07/23/2017  . Pancytopenia ([place]) 07/23/2017  . Alcoholic cirrhosis of liver with ascites ([place]) 07/23/2017    Past Surgical History:  Procedure Laterality Date  . ESOPHAGOGASTRODUODENOSCOPY N/A 09/03/2017   Procedure: ESOPHAGOGASTRODUODENOSCOPY (EGD);  Surgeon: Doran Stabler,  MD;  Location: [facility];  Service: Gastroenterology;  Laterality: N/A;  . FINGER FRACTURE SURGERY Left    "shattered my pinky"  . FRACTURE SURGERY    . IR PARACENTESIS  07/23/2017  . IR PARACENTESIS  07/2017   "did it twice in the same week" (09/01/2017)  . SHOULDER OPEN ROTATOR CUFF REPAIR Right   . TUBAL LIGATION    . VAGINAL HYSTERECTOMY       OB History   No obstetric history on file.      Home Medications    Prior to Admission  medications   Medication Sig Start Date End Date Taking? Authorizing Provider  busPIRone (BUSPAR) 10 MG tablet Take 1 tablet (10 mg total) by mouth 2 (two) times daily. 06/04/18   [name], FNP  FLUoxetine (PROZAC) 20 MG capsule Take 1 capsule (20 mg total) by mouth daily. 08/29/18   Derrill Center, NP  folic acid (FOLVITE) 1 MG tablet Take 1 tablet (1 mg total) by mouth daily. 08/26/18   Hongalgi, Lenis Dickinson, MD  furosemide (LASIX) 20 MG tablet Take 1 tablet (20 mg total) by mouth daily. 06/04/18   [name], FNP  gabapentin (NEURONTIN) 100 MG capsule Take 2 capsules (200 mg total) by mouth 3 (three) times daily. 07/08/18   [name], FNP  haloperidol (HALDOL) 5 MG tablet Take 1 tablet (5 mg total) by mouth 2 (two) times daily. 08/29/18   Derrill Center, NP  lactulose (CHRONULAC) 10 GM/15ML solution Take 30 mLs (20 g total) by mouth daily. Patient not taking: Reported on 08/22/2018 07/22/18   [name], FNP  Multiple Vitamin (MULTIVITAMIN WITH MINERALS) TABS tablet Take 1 tablet by mouth daily. 08/26/18   Hongalgi, Lenis Dickinson, MD  spironolactone (ALDACTONE) 50 MG tablet Take 1 tablet (50 mg total) by mouth daily. 06/04/18   [name], FNP  thiamine 100 MG tablet Take 1 tablet (100 mg total) by mouth daily. 08/26/18   Hongalgi, Lenis Dickinson, MD  topiramate (TOPAMAX) 50 MG tablet Take 1 tablet (50 mg total) by mouth daily. 06/04/18   [name], FNP  traZODone (DESYREL) 50 MG tablet Take 1 tablet (50 mg total) by mouth at bedtime as needed for sleep. 09/11/18   [name], FNP    Family History Family History  Problem Relation Age of Onset  . Lung cancer Mother 63  . Alcohol abuse Mother   . Throat cancer Father 20    Social History Social History   Tobacco Use  . Smoking status: Never Smoker  . Smokeless tobacco: Never Used  Substance Use Topics  . Alcohol use: Yes    Alcohol/week: 63.0 standard drinks    Types: 63 Cans of beer per week     Comment: weekly "I have cut back"  . Drug use: Yes    Types: Marijuana     Allergies   Patient has no known allergies.   Review of Systems Review of Systems  Constitutional: Negative for chills and fever.  HENT: Negative.   Eyes: Negative for visual disturbance.  Respiratory: Negative for cough and shortness of breath.   Cardiovascular: Negative for chest pain.  Gastrointestinal: Negative for abdominal pain, nausea and vomiting.  Genitourinary: Negative for dysuria and frequency.  Musculoskeletal: Positive for arthralgias and myalgias. Negative for joint swelling, neck pain and neck stiffness.  Skin: Negative for color change, rash and wound.  Neurological: Positive for syncope, light-headedness and headaches. Negative for dizziness, seizures,  facial asymmetry, speech difficulty, weakness and numbness.     Physical Exam Updated Vital Signs BP 94/61   Pulse 73   Temp 98.2 F (36.8 C) (Oral)   Resp 16   SpO2 95%   Physical Exam Vitals signs and nursing note reviewed.  Constitutional:      General: She is not in acute distress.    Appearance: She is well-developed. She is not diaphoretic.     Comments: Patient appears intoxicated, smells of alcohol  HENT:     Head: Normocephalic and atraumatic.     Comments: No evidence of head trauma, no battle sign or raccoon eyes    Mouth/Throat:     Mouth: Mucous membranes are moist.     Pharynx: Oropharynx is clear.  Eyes:     General:        Right eye: No discharge.        Left eye: No discharge.     Extraocular Movements: Extraocular movements intact.     Pupils: Pupils are equal, round, and reactive to light.  Neck:     Musculoskeletal: Neck supple.     Comments: No midline C-spine tenderness Cardiovascular:     Rate and Rhythm: Normal rate and regular rhythm.     Heart sounds: Normal heart sounds.  Pulmonary:     Effort: Pulmonary effort is normal. No respiratory distress.     Breath sounds: Normal breath sounds. No  wheezing or rales.     Comments: Respirations equal and unlabored, patient able to speak in full sentences, lungs clear to auscultation bilaterally Abdominal:     General: Bowel sounds are normal. There is no distension.     Palpations: Abdomen is soft. There is no mass.     Tenderness: There is no abdominal tenderness. There is no guarding.     Comments: Abdomen soft, nondistended, nontender to palpation in all quadrants without guarding or peritoneal signs  Musculoskeletal:        General: No deformity.     Comments: Some mild tenderness over the left elbow without overlying skin changes or palpable deformity, no edema, range of motion intact with some discomfort.  2+ radial pulse.  All of the joints supple and easily movable, all compartments soft.  No midline thoracic or lumbar tenderness.  Skin:    General: Skin is warm and dry.     Capillary Refill: Capillary refill takes less than 2 seconds.  Neurological:     Mental Status: She is alert and oriented to person, place, and time. Mental status is at baseline.     Coordination: Coordination normal.     Comments: Speech is clear, able to follow commands CN III-XII intact Normal strength in upper and lower extremities bilaterally including dorsiflexion and plantar flexion, strong and equal grip strength Sensation normal to light and sharp touch Moves extremities without ataxia, coordination intact   Psychiatric:        Mood and Affect: Mood normal.        Behavior: Behavior normal.      ED Treatments / Results  Labs (all labs ordered are listed, but only abnormal results are displayed) Labs Reviewed  CBC - Abnormal; Notable for the following components:      Result Value   RDW 19.9 (*)    Platelets 50 (*)    All other components within normal limits  COMPREHENSIVE METABOLIC PANEL - Abnormal; Notable for the following components:   CO2 20 (*)    Glucose, Bld 107 (*)  Calcium 8.5 (*)    Albumin 3.2 (*)    AST 47 (*)     All other components within normal limits  ETHANOL - Abnormal; Notable for the following components:   Alcohol, Ethyl (B) 277 (*)    All other components within normal limits  URINALYSIS, ROUTINE W REFLEX MICROSCOPIC  I-STAT BETA HCG BLOOD, ED ([facility], [facility], [facility] ONLY)  CBG MONITORING, ED    EKG None  Radiology Dg Elbow Complete Left  Result Date: 09/11/2018 CLINICAL DATA:  Fall with left elbow pain. EXAM: LEFT ELBOW - COMPLETE 3+ VIEW COMPARISON:  None. FINDINGS: There is no evidence of fracture, dislocation, or joint effusion. There is no evidence of arthropathy or other focal bone abnormality. Soft tissue swelling over the posterior elbow and forearm. IMPRESSION: No acute fracture or dislocation. Electronically Signed   By: Marin Olp M.D.   On: 09/11/2018 20:54   Ct Head Wo Contrast  Result Date: 09/11/2018 CLINICAL DATA:  Syncope/fainting, cardiac etiol suspected EXAM: CT HEAD WITHOUT CONTRAST TECHNIQUE: Contiguous axial images were obtained from the base of the skull through the vertex without intravenous contrast. COMPARISON:  Multiple prior exams most recently 09/06/2018 FINDINGS: Brain: Streak artifact on the left from patient's earring. Unchanged atrophy. No intracranial hemorrhage, mass effect, or midline shift. No hydrocephalus. The basilar cisterns are patent. No evidence of territorial infarct or acute ischemia. No extra-axial or intracranial fluid collection. Vascular: No hyperdense vessel. Skull: No fracture or focal lesion. Sinuses/Orbits: Paranasal sinuses and mastoid air cells are clear. The visualized orbits are unremarkable. Other: None. IMPRESSION: 1. No acute intracranial abnormality. 2. Unchanged atrophy. Electronically Signed   By: Keith Rake M.D.   On: 09/11/2018 20:54    Procedures Procedures (including critical care time)  Medications Ordered in ED Medications  sodium chloride 0.9 % bolus 1,000 mL (1,000 mLs Intravenous New Bag/Given 09/11/18 2231)  phenytoin  (DILANTIN) 1,000 mg in sodium chloride 0.9 % 250 mL IVPB (0 mg Intravenous Stopped 09/11/18 2340)     Initial Impression / Assessment and Plan / ED Course  I have reviewed the triage vital signs and the nursing notes.  Pertinent labs & imaging results that were available during my care of the patient were reviewed by me and considered in my medical decision making (see chart for details).  Patient presents after near syncopal episode, she was helped to the ground by [employer] staff members and did not fall or hit her head, was seen recently for head trauma and thought to have likely concussion, reports intermittent mild headaches and difficulty remembering things since then.  Patient appears intoxicated on arrival and smells of alcohol, history of multiple ED encounters for acute alcohol intoxication noted in patient's chart.  On arrival slightly soft blood pressures of 94/61 but all other vitals normal.  Suspect some degree of dehydration may have affected patient's near syncopal episode, no seizure activity witnessed, patient does have history of seizures and reports she has not taken her Dilantin in many weeks, but declines IV Dilantin load here in the ED.  No associated chest pain or shortness of breath.  Complaining of some mild left elbow pain from prior fall, no obvious deformity noted, x-ray ordered.  No evidence of other traumatic injury.  Basic labs, urinalysis and ethanol ordered as well as EKG, head CT and x-rays of the left elbow.  Work-up very reassuring, no leukocytosis and normal hemoglobin, no acute electrolyte derangements requiring intervention, normal renal and liver function.  Urinalysis  without signs of infection, negative pregnancy.  Alcohol is elevated at 277 and patient continues to appear acutely intoxicated.  Will give IV fluids and reevaluate after patient has had time to clinically sober.  X-rays of the left elbow show no acute fracture dislocation and CT head  shows no acute intracranial abnormality.  At shift change care signed out to [place] who will reevaluate patient after she has had time to clinically sober, and will ensure that after fluids patient is able to ambulate in the department without further lightheadedness or near syncope.  Given reassuring work-up suspect patient can be discharged home.  Final Clinical Impressions(s) / ED Diagnoses   Final diagnoses:  Near syncope  Acute alcoholic intoxication without complication [facility])    ED Discharge Orders    None       Janet Berlin 09/12/18 0159    Lacretia Leigh, MD 09/12/18 2100

## 2018-09-12 LAB — URINALYSIS, ROUTINE W REFLEX MICROSCOPIC
Bilirubin Urine: NEGATIVE
Glucose, UA: NEGATIVE mg/dL
Hgb urine dipstick: NEGATIVE
Ketones, ur: NEGATIVE mg/dL
Leukocytes, UA: NEGATIVE
Nitrite: NEGATIVE
Protein, ur: NEGATIVE mg/dL
Specific Gravity, Urine: 1.004 — ABNORMAL LOW (ref 1.005–1.030)
pH: 6 (ref 5.0–8.0)

## 2018-09-12 NOTE — Discharge Instructions (Addendum)
Get help right away if: °You have any of the following: °Moderate to severe trouble with coordination, speech, memory, or attention. °Trouble staying awake. °Severe confusion. °A seizure. °Light-headedness. °Fainting. °Vomiting bright red blood or material that looks like coffee grounds. °Bloody stool (feces). The blood may make your stool bright red, black, or tarry. It may also smell bad. °Shakiness when trying to stop drinking. °Thoughts about hurting yourself or others. °

## 2018-09-12 NOTE — ED Notes (Signed)
Pt refused orthostatic vitals

## 2018-09-13 ENCOUNTER — Emergency Department (HOSPITAL_BASED_OUTPATIENT_CLINIC_OR_DEPARTMENT_OTHER): Payer: PRIVATE HEALTH INSURANCE

## 2018-09-13 ENCOUNTER — Inpatient Hospital Stay (HOSPITAL_COMMUNITY)
Admission: EM | Admit: 2018-09-13 | Discharge: 2018-09-22 | DRG: 580 | Disposition: A | Discharge status: Home / Self Care | Payer: PRIVATE HEALTH INSURANCE | Attending: Internal Medicine | Admitting: Internal Medicine

## 2018-09-13 ENCOUNTER — Encounter (HOSPITAL_COMMUNITY): Payer: Self-pay

## 2018-09-13 DIAGNOSIS — A46 Erysipelas: Principal | ICD-10-CM | POA: Diagnosis present

## 2018-09-13 DIAGNOSIS — F419 Anxiety disorder, unspecified: Secondary | ICD-10-CM | POA: Diagnosis present

## 2018-09-13 DIAGNOSIS — M71122 Other infective bursitis, left elbow: Secondary | ICD-10-CM | POA: Diagnosis present

## 2018-09-13 DIAGNOSIS — F319 Bipolar disorder, unspecified: Secondary | ICD-10-CM | POA: Diagnosis present

## 2018-09-13 DIAGNOSIS — R52 Pain, unspecified: Secondary | ICD-10-CM | POA: Diagnosis not present

## 2018-09-13 DIAGNOSIS — Z808 Family history of malignant neoplasm of other organs or systems: Secondary | ICD-10-CM

## 2018-09-13 DIAGNOSIS — Z79899 Other long term (current) drug therapy: Secondary | ICD-10-CM

## 2018-09-13 DIAGNOSIS — D696 Thrombocytopenia, unspecified: Secondary | ICD-10-CM

## 2018-09-13 DIAGNOSIS — B1920 Unspecified viral hepatitis C without hepatic coma: Secondary | ICD-10-CM | POA: Diagnosis present

## 2018-09-13 DIAGNOSIS — L03114 Cellulitis of left upper limb: Secondary | ICD-10-CM

## 2018-09-13 DIAGNOSIS — K7031 Alcoholic cirrhosis of liver with ascites: Secondary | ICD-10-CM | POA: Diagnosis present

## 2018-09-13 DIAGNOSIS — D72829 Elevated white blood cell count, unspecified: Secondary | ICD-10-CM

## 2018-09-13 DIAGNOSIS — G43909 Migraine, unspecified, not intractable, without status migrainosus: Secondary | ICD-10-CM | POA: Diagnosis present

## 2018-09-13 DIAGNOSIS — R609 Edema, unspecified: Secondary | ICD-10-CM | POA: Diagnosis present

## 2018-09-13 DIAGNOSIS — Z811 Family history of alcohol abuse and dependence: Secondary | ICD-10-CM

## 2018-09-13 DIAGNOSIS — M7022 Olecranon bursitis, left elbow: Secondary | ICD-10-CM

## 2018-09-13 DIAGNOSIS — Z590 Homelessness: Secondary | ICD-10-CM

## 2018-09-13 DIAGNOSIS — F209 Schizophrenia, unspecified: Secondary | ICD-10-CM | POA: Diagnosis present

## 2018-09-13 DIAGNOSIS — Z801 Family history of malignant neoplasm of trachea, bronchus and lung: Secondary | ICD-10-CM

## 2018-09-13 DIAGNOSIS — F1014 Alcohol abuse with alcohol-induced mood disorder: Secondary | ICD-10-CM | POA: Diagnosis present

## 2018-09-13 DIAGNOSIS — F1020 Alcohol dependence, uncomplicated: Secondary | ICD-10-CM | POA: Diagnosis present

## 2018-09-13 DIAGNOSIS — K219 Gastro-esophageal reflux disease without esophagitis: Secondary | ICD-10-CM | POA: Diagnosis present

## 2018-09-13 DIAGNOSIS — D6959 Other secondary thrombocytopenia: Secondary | ICD-10-CM | POA: Diagnosis present

## 2018-09-13 DIAGNOSIS — Z87442 Personal history of urinary calculi: Secondary | ICD-10-CM

## 2018-09-13 DIAGNOSIS — M79632 Pain in left forearm: Secondary | ICD-10-CM | POA: Diagnosis present

## 2018-09-13 DIAGNOSIS — I959 Hypotension, unspecified: Secondary | ICD-10-CM | POA: Diagnosis present

## 2018-09-13 DIAGNOSIS — L0291 Cutaneous abscess, unspecified: Secondary | ICD-10-CM

## 2018-09-13 DIAGNOSIS — F10239 Alcohol dependence with withdrawal, unspecified: Secondary | ICD-10-CM | POA: Diagnosis present

## 2018-09-13 DIAGNOSIS — M7989 Other specified soft tissue disorders: Secondary | ICD-10-CM

## 2018-09-13 DIAGNOSIS — R569 Unspecified convulsions: Secondary | ICD-10-CM | POA: Diagnosis present

## 2018-09-13 DIAGNOSIS — D709 Neutropenia, unspecified: Secondary | ICD-10-CM | POA: Diagnosis not present

## 2018-09-13 DIAGNOSIS — F1024 Alcohol dependence with alcohol-induced mood disorder: Secondary | ICD-10-CM | POA: Diagnosis present

## 2018-09-13 DIAGNOSIS — K5900 Constipation, unspecified: Secondary | ICD-10-CM | POA: Diagnosis present

## 2018-09-13 DIAGNOSIS — I8500 Esophageal varices without bleeding: Secondary | ICD-10-CM | POA: Diagnosis present

## 2018-09-13 DIAGNOSIS — K7010 Alcoholic hepatitis without ascites: Secondary | ICD-10-CM | POA: Diagnosis present

## 2018-09-13 DIAGNOSIS — E876 Hypokalemia: Secondary | ICD-10-CM | POA: Diagnosis present

## 2018-09-13 DIAGNOSIS — Z90710 Acquired absence of both cervix and uterus: Secondary | ICD-10-CM

## 2018-09-13 DIAGNOSIS — R768 Other specified abnormal immunological findings in serum: Secondary | ICD-10-CM | POA: Diagnosis present

## 2018-09-13 DIAGNOSIS — D649 Anemia, unspecified: Secondary | ICD-10-CM | POA: Diagnosis present

## 2018-09-13 HISTORY — DX: Erysipelas: A46

## 2018-09-13 LAB — COMPREHENSIVE METABOLIC PANEL
ALT: 27 U/L (ref 0–44)
AST: 31 U/L (ref 15–41)
Albumin: 3.1 g/dL — ABNORMAL LOW (ref 3.5–5.0)
Alkaline Phosphatase: 89 U/L (ref 38–126)
Anion gap: 8 (ref 5–15)
BUN: 10 mg/dL (ref 6–20)
CO2: 22 mmol/L (ref 22–32)
Calcium: 8.2 mg/dL — ABNORMAL LOW (ref 8.9–10.3)
Chloride: 101 mmol/L (ref 98–111)
Creatinine, Ser: 0.89 mg/dL (ref 0.44–1.00)
GFR calc Af Amer: >60 mL/min (ref >60)
GFR calc non Af Amer: >60 mL/min (ref >60)
Glucose, Bld: 119 mg/dL — ABNORMAL HIGH (ref 70–99)
Potassium: 3.5 mmol/L (ref 3.5–5.1)
Sodium: 131 mmol/L — ABNORMAL LOW (ref 135–145)
Total Bilirubin: 2 mg/dL — ABNORMAL HIGH (ref 0.3–1.2)
Total Protein: 7.6 g/dL (ref 6.5–8.1)

## 2018-09-13 LAB — CBC WITH DIFFERENTIAL/PLATELET
Abs Immature Granulocytes: 0.05 10*3/uL (ref 0.00–0.07)
Basophils Absolute: 0 10*3/uL (ref 0.0–0.1)
Basophils Relative: 0 %
Eosinophils Absolute: 0.2 10*3/uL (ref 0.0–0.5)
Eosinophils Relative: 2 %
HCT: 40.4 % (ref 36.0–46.0)
Hemoglobin: 12.9 g/dL (ref 12.0–15.0)
Immature Granulocytes: 0 %
Lymphocytes Relative: 11 %
Lymphs Abs: 1.3 10*3/uL (ref 0.7–4.0)
MCH: 28.1 pg (ref 26.0–34.0)
MCHC: 31.9 g/dL (ref 30.0–36.0)
MCV: 88 fL (ref 80.0–100.0)
Monocytes Absolute: 1.6 10*3/uL — ABNORMAL HIGH (ref 0.1–1.0)
Monocytes Relative: 14 %
Neutro Abs: 8.4 10*3/uL — ABNORMAL HIGH (ref 1.7–7.7)
Neutrophils Relative %: 73 %
Platelets: 33 10*3/uL — ABNORMAL LOW (ref 150–400)
RBC: 4.59 MIL/uL (ref 3.87–5.11)
RDW: 19.2 % — ABNORMAL HIGH (ref 11.5–15.5)
WBC: 11.5 10*3/uL — ABNORMAL HIGH (ref 4.0–10.5)
nRBC: 0 % (ref 0.0–0.2)

## 2018-09-13 LAB — I-STAT CG4 LACTIC ACID, ED: Lactic Acid, Venous: 1.79 mmol/L (ref 0.5–1.9)

## 2018-09-13 LAB — CK: Total CK: 49 U/L (ref 38–234)

## 2018-09-13 LAB — ETHANOL: Alcohol, Ethyl (B): <10 mg/dL (ref <10)

## 2018-09-13 MED ORDER — LACTULOSE 10 GM/15ML PO SOLN
20.0000 g | Freq: Two times a day (BID) | ORAL | Status: DC | PRN
  Administered 2018-09-21: 20 g via ORAL
  Filled 2018-09-13: qty 30

## 2018-09-13 MED ORDER — FLUOXETINE HCL 20 MG PO CAPS
20.0000 mg | ORAL_CAPSULE | Freq: Every day | ORAL | Status: DC
  Administered 2018-09-14 – 2018-09-22 (×9): 20 mg via ORAL
  Filled 2018-09-13 (×9): qty 1

## 2018-09-13 MED ORDER — ADULT MULTIVITAMIN W/MINERALS CH
1.0000 | ORAL_TABLET | Freq: Every day | ORAL | Status: DC
  Administered 2018-09-14 – 2018-09-22 (×9): 1 via ORAL
  Filled 2018-09-13 (×9): qty 1

## 2018-09-13 MED ORDER — PIPERACILLIN-TAZOBACTAM 3.375 G IVPB 30 MIN
3.3750 g | Freq: Once | INTRAVENOUS | Status: AC
  Administered 2018-09-13: 3.375 g via INTRAVENOUS
  Filled 2018-09-13: qty 50

## 2018-09-13 MED ORDER — HYDROMORPHONE HCL 1 MG/ML IJ SOLN
0.5000 mg | INTRAMUSCULAR | Status: DC | PRN
  Administered 2018-09-16 – 2018-09-19 (×6): 0.5 mg via INTRAVENOUS
  Filled 2018-09-13 (×7): qty 0.5

## 2018-09-13 MED ORDER — TRAZODONE HCL 50 MG PO TABS
50.0000 mg | ORAL_TABLET | Freq: Every evening | ORAL | Status: DC | PRN
  Administered 2018-09-17 – 2018-09-21 (×4): 50 mg via ORAL
  Filled 2018-09-13 (×4): qty 1

## 2018-09-13 MED ORDER — BUSPIRONE HCL 10 MG PO TABS
10.0000 mg | ORAL_TABLET | Freq: Two times a day (BID) | ORAL | Status: DC
  Administered 2018-09-13 – 2018-09-22 (×17): 10 mg via ORAL
  Filled 2018-09-13 (×18): qty 1

## 2018-09-13 MED ORDER — LORAZEPAM 2 MG/ML IJ SOLN: 1.0000 mg | Freq: Four times a day (QID) | INTRAMUSCULAR | Status: AC | PRN

## 2018-09-13 MED ORDER — CEFAZOLIN SODIUM-DEXTROSE 2-4 GM/100ML-% IV SOLN
2.0000 g | Freq: Three times a day (TID) | INTRAVENOUS | Status: DC
  Administered 2018-09-14 – 2018-09-22 (×24): 2 g via INTRAVENOUS
  Filled 2018-09-13 (×27): qty 100

## 2018-09-13 MED ORDER — PHENYTOIN SODIUM EXTENDED 100 MG PO CAPS
300.0000 mg | ORAL_CAPSULE | Freq: Every day | ORAL | Status: DC
  Administered 2018-09-13 – 2018-09-22 (×10): 300 mg via ORAL
  Filled 2018-09-13 (×10): qty 3

## 2018-09-13 MED ORDER — VANCOMYCIN HCL IN DEXTROSE 1-5 GM/200ML-% IV SOLN
1000.0000 mg | Freq: Once | INTRAVENOUS | Status: AC
  Administered 2018-09-13: 1000 mg via INTRAVENOUS
  Filled 2018-09-13: qty 200

## 2018-09-13 MED ORDER — LORAZEPAM 1 MG PO TABS
0.0000 mg | ORAL_TABLET | Freq: Two times a day (BID) | ORAL | Status: DC
  Administered 2018-09-16 (×2): 1 mg via ORAL
  Filled 2018-09-13 (×2): qty 1

## 2018-09-13 MED ORDER — OXYCODONE-ACETAMINOPHEN 5-325 MG PO TABS
1.0000 | ORAL_TABLET | ORAL | Status: DC | PRN
  Administered 2018-09-13 – 2018-09-14 (×3): 1 via ORAL
  Administered 2018-09-15 – 2018-09-17 (×7): 2 via ORAL
  Filled 2018-09-13: qty 1
  Filled 2018-09-13 (×2): qty 2
  Filled 2018-09-13: qty 1
  Filled 2018-09-13 (×6): qty 2

## 2018-09-13 MED ORDER — TOPIRAMATE 25 MG PO TABS
50.0000 mg | ORAL_TABLET | Freq: Every day | ORAL | Status: DC
  Administered 2018-09-13 – 2018-09-22 (×10): 50 mg via ORAL
  Filled 2018-09-13 (×10): qty 2

## 2018-09-13 MED ORDER — LORAZEPAM 1 MG PO TABS
1.0000 mg | ORAL_TABLET | Freq: Four times a day (QID) | ORAL | Status: AC | PRN
  Administered 2018-09-15 – 2018-09-16 (×4): 1 mg via ORAL
  Filled 2018-09-13 (×3): qty 1

## 2018-09-13 MED ORDER — LORAZEPAM 1 MG PO TABS
0.0000 mg | ORAL_TABLET | Freq: Four times a day (QID) | ORAL | Status: DC
  Administered 2018-09-13: 1 mg via ORAL
  Administered 2018-09-14: 2 mg via ORAL
  Administered 2018-09-14 – 2018-09-15 (×3): 1 mg via ORAL
  Filled 2018-09-13: qty 2
  Filled 2018-09-13 (×5): qty 1

## 2018-09-13 MED ORDER — GABAPENTIN 100 MG PO CAPS
200.0000 mg | ORAL_CAPSULE | Freq: Three times a day (TID) | ORAL | Status: DC
  Administered 2018-09-13 – 2018-09-22 (×26): 200 mg via ORAL
  Filled 2018-09-13 (×26): qty 2

## 2018-09-13 MED ORDER — VITAMIN B-1 100 MG PO TABS
100.0000 mg | ORAL_TABLET | Freq: Every day | ORAL | Status: DC
  Administered 2018-09-14 – 2018-09-22 (×9): 100 mg via ORAL
  Filled 2018-09-13 (×9): qty 1

## 2018-09-13 MED ORDER — SODIUM CHLORIDE 0.9 % IV BOLUS
1000.0000 mL | Freq: Once | INTRAVENOUS | Status: AC
  Administered 2018-09-13: 1000 mL via INTRAVENOUS

## 2018-09-13 MED ORDER — FOLIC ACID 1 MG PO TABS
1.0000 mg | ORAL_TABLET | Freq: Every day | ORAL | Status: DC
  Administered 2018-09-14 – 2018-09-22 (×9): 1 mg via ORAL
  Filled 2018-09-13 (×9): qty 1

## 2018-09-13 MED ORDER — MORPHINE SULFATE (PF) 4 MG/ML IV SOLN
4.0000 mg | Freq: Once | INTRAVENOUS | Status: AC
  Administered 2018-09-13: 4 mg via INTRAVENOUS
  Filled 2018-09-13: qty 1

## 2018-09-13 NOTE — ED Provider Notes (Signed)
[facility] DEPT Provider Note   CSN: [id number] Arrival date & time: 09/13/18  1507     History   Chief Complaint Chief Complaint  Patient presents with  . Cellulitis    HPI Briana Sweeney is a 48 y.o. female with a PMHx of alcoholism, bipolar disorder, anxiety, liver cirrhosis, HepC, GERD, migraines, seizures, and other conditions listed below, who presents to the ED with complaints of L arm pain, swelling, warmth, erythema, and red streaking that began yesterday and has gradually worsened. Of note, chart review reveals that she was seen in the ED on 09/11/18 for possible syncopal episode, was found to be intoxicated with an alcohol level of 277, her work up was otherwise fairly unremarkable, she was given 1000mg  IV dilantin and eventually discharged once she was sobered up.  She states that the IV she had when she was here was in her left hand, states that it hurt while it was in but she did not notice any issues when she was receiving the IV medications, it was until the next day that she noticed having warmth, redness, swelling, and pain to the entire left forearm.  She describes the pain as 10/10 constant throbbing nonradiating left forearm pain from the hand all the way to the elbow, worse with palpation, and unrelieved with ibuprofen.  She reports associated chills and general not feeling well, states that she feels "like she has the flu" with regards to the chills and aching.  She denies any fevers that she is aware of, chest pain, shortness breath, abdominal pain, nausea, vomiting, diarrhea, constipation, dysuria, hematuria, numbness, tingling, focal weakness, or any other complaints at this time.  She denies using IV drugs.  She denies any injury to the arm recently, she has a scab on the left elbow that was healing well, is from about a week ago, she denies any issues with that area.  The history is provided by the patient and medical records. No language  interpreter was used.    Past Medical History:  Diagnosis Date  . Anxiety   . Bipolar affective disorder ([place])    With anxiety features  . Cirrhosis of liver ([place])    Due to alcohol and hepatitis C  . Depression   . ETOHism ([place])   . GERD (gastroesophageal reflux disease)   . Hematemesis 02/10/2018  . Hepatitis C 2018   hepatitis c and alcohol related hepatitis  . Heroin abuse ([place])   . History of blood transfusion    "blood doesn't clot; I fell down and had to have a transfusion"  . History of kidney stones   . Migraine    "when I get really stressed" (09/01/2017)  . Schizophrenia ([place])   . Seizures ([place])    "when I run out of my RX; lots recently" (09/01/2017)    Patient Active Problem List   Diagnosis Date Noted  . Acute metabolic encephalopathy 58/52/7782  . Overdose 08/22/2018  . Hypotension 08/22/2018  . Seizure ([place]) 08/22/2018  . Upper GI bleed 02/10/2018  . Alcohol withdrawal, with unspecified complication ([place]) 42/35/3614  . Chronic anemia   . Thrombocytopenia due to drugs   . Suicidal ideation   . Thrombocytopenia ([place]) 01/02/2018  . GERD (gastroesophageal reflux disease) 11/01/2017  . Trichimoniasis 10/30/2017  . Alcohol dependence ([place]) 10/29/2017  . MDD (major depressive disorder), severe ([place]) 10/28/2017  . Acute hyperactive alcohol withdrawal delirium ([place]) 10/09/2017  . Schizophrenia ([place]) 10/09/2017  . Alcohol abuse with alcohol-induced mood  disorder ([place])   . Alcohol withdrawal syndrome with complication ([place]) 20/25/4270  . Esophageal varices without bleeding ([place])   . Hematemesis 09/01/2017  . Ascites due to alcoholic cirrhosis ([place])   . Decompensated hepatic cirrhosis ([place]) 07/23/2017  . Alcohol abuse 07/23/2017  . Hepatitis C antibody positive in blood 07/23/2017  . Hypokalemia 07/23/2017  . Jaundice 07/23/2017  . Coagulopathy ([place]) 07/23/2017  . Hypomagnesemia 07/23/2017  . Pancytopenia ([place]) 07/23/2017  . Alcoholic cirrhosis of liver  with ascites ([place]) 07/23/2017    Past Surgical History:  Procedure Laterality Date  . ESOPHAGOGASTRODUODENOSCOPY N/A 09/03/2017   Procedure: ESOPHAGOGASTRODUODENOSCOPY (EGD);  Surgeon: Doran Stabler, MD;  Location: [facility];  Service: Gastroenterology;  Laterality: N/A;  . FINGER FRACTURE SURGERY Left    "shattered my pinky"  . FRACTURE SURGERY    . IR PARACENTESIS  07/23/2017  . IR PARACENTESIS  07/2017   "did it twice in the same week" (09/01/2017)  . SHOULDER OPEN ROTATOR CUFF REPAIR Right   . TUBAL LIGATION    . VAGINAL HYSTERECTOMY       OB History   No obstetric history on file.      Home Medications    Prior to Admission medications   Medication Sig Start Date End Date Taking? Authorizing Provider  busPIRone (BUSPAR) 10 MG tablet Take 1 tablet (10 mg total) by mouth 2 (two) times daily. 06/04/18   [name], FNP  FLUoxetine (PROZAC) 20 MG capsule Take 1 capsule (20 mg total) by mouth daily. 08/29/18   Derrill Center, NP  folic acid (FOLVITE) 1 MG tablet Take 1 tablet (1 mg total) by mouth daily. 08/26/18   Hongalgi, Lenis Dickinson, MD  furosemide (LASIX) 20 MG tablet Take 1 tablet (20 mg total) by mouth daily. 06/04/18   [name], FNP  gabapentin (NEURONTIN) 100 MG capsule Take 2 capsules (200 mg total) by mouth 3 (three) times daily. 07/08/18   [name], FNP  haloperidol (HALDOL) 5 MG tablet Take 1 tablet (5 mg total) by mouth 2 (two) times daily. 08/29/18   Derrill Center, NP  lactulose (CHRONULAC) 10 GM/15ML solution Take 30 mLs (20 g total) by mouth daily. Patient not taking: Reported on 08/22/2018 07/22/18   [name], FNP  Multiple Vitamin (MULTIVITAMIN WITH MINERALS) TABS tablet Take 1 tablet by mouth daily. 08/26/18   Hongalgi, Lenis Dickinson, MD  spironolactone (ALDACTONE) 50 MG tablet Take 1 tablet (50 mg total) by mouth daily. 06/04/18   [name], FNP  thiamine 100 MG tablet Take 1 tablet (100 mg total) by mouth daily.  08/26/18   Hongalgi, Lenis Dickinson, MD  topiramate (TOPAMAX) 50 MG tablet Take 1 tablet (50 mg total) by mouth daily. 06/04/18   [name], FNP  traZODone (DESYREL) 50 MG tablet Take 1 tablet (50 mg total) by mouth at bedtime as needed for sleep. 09/11/18   [name], FNP    Family History Family History  Problem Relation Age of Onset  . Lung cancer Mother 73  . Alcohol abuse Mother   . Throat cancer Father 32    Social History Social History   Tobacco Use  . Smoking status: Never Smoker  . Smokeless tobacco: Never Used  Substance Use Topics  . Alcohol use: Yes    Alcohol/week: 63.0 standard drinks    Types: 63 Cans of beer per week    Comment: weekly "I have cut back"  . Drug use:  Yes    Types: Marijuana     Allergies   Patient has no known allergies.   Review of Systems Review of Systems  Constitutional: Positive for chills. Negative for fever.  Respiratory: Negative for shortness of breath.   Cardiovascular: Negative for chest pain.  Gastrointestinal: Negative for abdominal pain, constipation, diarrhea, nausea and vomiting.  Genitourinary: Negative for dysuria and hematuria.  Musculoskeletal: Positive for arthralgias, joint swelling and myalgias.  Skin: Positive for color change.  Allergic/Immunologic: Negative for immunocompromised state.  Neurological: Negative for weakness and numbness.  Psychiatric/Behavioral: Negative for confusion.   All other systems reviewed and are negative for acute change except as noted in the HPI.    Physical Exam Updated Vital Signs BP 103/72 (BP Location: Right Arm)   Pulse 81   Temp 98.9 F (37.2 C) (Oral)   Resp 16   SpO2 99%   Physical Exam Vitals signs and nursing note reviewed.  Constitutional:      General: She is not in acute distress.    Appearance: Normal appearance. She is well-developed. She is not toxic-appearing.     Comments: Afebrile, nontoxic, NAD  HENT:     Head: Normocephalic and atraumatic.    Eyes:     General:        Right eye: No discharge.        Left eye: No discharge.     Conjunctiva/sclera: Conjunctivae normal.  Neck:     Musculoskeletal: Normal range of motion and neck supple.  Cardiovascular:     Rate and Rhythm: Normal rate and regular rhythm.     Pulses: Normal pulses.     Heart sounds: Normal heart sounds, S1 normal and S2 normal. No murmur. No friction rub. No gallop.   Pulmonary:     Effort: Pulmonary effort is normal. No respiratory distress.     Breath sounds: Normal breath sounds. No decreased breath sounds, wheezing, rhonchi or rales.  Abdominal:     General: Bowel sounds are normal. There is no distension.     Palpations: Abdomen is soft. Abdomen is not rigid.     Tenderness: There is no abdominal tenderness. There is no right CVA tenderness, left CVA tenderness, guarding or rebound. Negative signs include Murphy's sign and McBurney's sign.  Musculoskeletal: Normal range of motion.     Right forearm: She exhibits tenderness and swelling.     Comments: L hand and forearm with swelling and erythema extending from the MCP joints all the way to the elbow on both the dorsal and volar aspects of the hand/forearm, with some red streaking to the medial upper arm, and white streaking within the erythema on the forearm.  Moderately tender to touch, slightly indurated.  No fluctuance.  No areas of drainage. Small scab to elbow, healing well, without drainage or focal swelling to this area. FROM intact in all digits and in the elbow and wrist. Strength and sensation grossly intact, distal pulses intact, soft compartments. No palpable cord. SEE PICTURE BELOW  Skin:    General: Skin is warm and dry.     Findings: Erythema present. No rash.     Comments: L forearm erythema, warmth, and swelling as mentioned above and pictured below  Neurological:     Mental Status: She is alert and oriented to person, place, and time.     Sensory: Sensation is intact. No sensory deficit.      Motor: Motor function is intact.  Psychiatric:        Mood  and Affect: Mood and affect normal.        Behavior: Behavior normal.          ED Treatments / Results  Labs (all labs ordered are listed, but only abnormal results are displayed) Labs Reviewed  CBC WITH DIFFERENTIAL/PLATELET - Abnormal; Notable for the following components:      Result Value   WBC 11.5 (*)    RDW 19.2 (*)    Platelets 33 (*)    Neutro Abs 8.4 (*)    Monocytes Absolute 1.6 (*)    All other components within normal limits  COMPREHENSIVE METABOLIC PANEL - Abnormal; Notable for the following components:   Sodium 131 (*)    Glucose, Bld 119 (*)    Calcium 8.2 (*)    Albumin 3.1 (*)    Total Bilirubin 2.0 (*)    All other components within normal limits  CULTURE, BLOOD (ROUTINE X 2)  CULTURE, BLOOD (ROUTINE X 2)  CK  I-STAT CG4 LACTIC ACID, ED    EKG None  Radiology Dg Elbow Complete Left  Result Date: 09/11/2018 CLINICAL DATA:  Fall with left elbow pain. EXAM: LEFT ELBOW - COMPLETE 3+ VIEW COMPARISON:  None. FINDINGS: There is no evidence of fracture, dislocation, or joint effusion. There is no evidence of arthropathy or other focal bone abnormality. Soft tissue swelling over the posterior elbow and forearm. IMPRESSION: No acute fracture or dislocation. Electronically Signed   By: Marin Olp M.D.   On: 09/11/2018 20:54   Ct Head Wo Contrast  Result Date: 09/11/2018 CLINICAL DATA:  Syncope/fainting, cardiac etiol suspected EXAM: CT HEAD WITHOUT CONTRAST TECHNIQUE: Contiguous axial images were obtained from the base of the skull through the vertex without intravenous contrast. COMPARISON:  Multiple prior exams most recently 09/06/2018 FINDINGS: Brain: Streak artifact on the left from patient's earring. Unchanged atrophy. No intracranial hemorrhage, mass effect, or midline shift. No hydrocephalus. The basilar cisterns are patent. No evidence of territorial infarct or acute ischemia. No  extra-axial or intracranial fluid collection. Vascular: No hyperdense vessel. Skull: No fracture or focal lesion. Sinuses/Orbits: Paranasal sinuses and mastoid air cells are clear. The visualized orbits are unremarkable. Other: None. IMPRESSION: 1. No acute intracranial abnormality. 2. Unchanged atrophy. Electronically Signed   By: Keith Rake M.D.   On: 09/11/2018 20:54   Ue Venous Duplex ([facility] And [facility] Only)  Result Date: 09/13/2018 UPPER VENOUS STUDY  Indications: Swelling, and Pain Performing Technologist: Abram Sander RVS  Examination Guidelines: A complete evaluation includes B-mode imaging, spectral Doppler, color Doppler, and power Doppler as needed of all accessible portions of each vessel. Bilateral testing is considered an integral part of a complete examination. Limited examinations for reoccurring indications may be performed as noted.  Left Findings: +----------+------------+----------+---------+-----------+-------+ LEFT      CompressiblePropertiesPhasicitySpontaneousSummary +----------+------------+----------+---------+-----------+-------+ IJV           Full                 Yes       Yes            +----------+------------+----------+---------+-----------+-------+ Subclavian    Full                 Yes       Yes            +----------+------------+----------+---------+-----------+-------+ Axillary      Full                 Yes       Yes            +----------+------------+----------+---------+-----------+-------+  Brachial      Full                 Yes       Yes            +----------+------------+----------+---------+-----------+-------+ Radial        Full                                          +----------+------------+----------+---------+-----------+-------+ Ulnar         Full                                          +----------+------------+----------+---------+-----------+-------+ Cephalic      Full                                           +----------+------------+----------+---------+-----------+-------+ Basilic       Full                                          +----------+------------+----------+---------+-----------+-------+  Summary:  Left: No evidence of deep vein thrombosis in the upper extremity. No evidence of superficial vein thrombosis in the upper extremity.  *See table(s) above for measurements and observations.    Preliminary     Procedures Procedures (including critical care time)  Medications Ordered in ED Medications  vancomycin (VANCOCIN) IVPB 1000 mg/200 mL premix (has no administration in time range)  sodium chloride 0.9 % bolus 1,000 mL (has no administration in time range)  piperacillin-tazobactam (ZOSYN) IVPB 3.375 g (has no administration in time range)  morphine 4 MG/ML injection 4 mg (4 mg Intravenous Given 09/13/18 1712)     Initial Impression / Assessment and Plan / ED Course  I have reviewed the triage vital signs and the nursing notes.  Pertinent labs & imaging results that were available during my care of the patient were reviewed by me and considered in my medical decision making (see chart for details).     48 y.o. female here with left arm pain, erythema, warmth, and swelling that began the day after she was in the ER.  She received IV Dilantin in the ER, had her IV in the left hand, but states that she did not have any issues until the next day.  No other injury to the arm.  On exam, swelling and erythema extending from the MCP joints all the way to the elbow, with some red streaking to the medial upper arm, and white streaking within the erythema on the forearm.  Moderately tender to touch, slightly indurated.  No fluctuance.  Neurovascularly intact.  FROM intact in all joints. Clinically looks like cellulitis. Will get labs and DVT U/S, give pain meds, then reassess shortly. Discussed case with my attending Dr. Darl Householder who agrees with plan.   6:26 PM CBC w/diff with mildly elevated WBC  11.5 and chronically low plt 33. CMP with mildly elevated Tbili 2.0. CK WNL. DVT U/S negative for DVT or superficial thrombophlebitis. Lactic WNL. Exam c/w cellulitis. Pt now with some mild hypotension, SBP 90s. Will proceed with admission for cellulitis  with hypotension. Vanc/zosyn ordered, fluids ordered, BCx's ordered. Discussed case with my attending Dr. Darl Householder who agrees with plan.   6:46 PM Dr. Hampton Abbot of [facility] returning page and will admit. Holding orders to be placed by admitting team. Please see their notes for further documentation of care. I appreciate their help with this pleasant pt's care. Pt stable at time of admission.    Final Clinical Impressions(s) / ED Diagnoses   Final diagnoses:  Cellulitis of left forearm  Hypotension, unspecified hypotension type  Leukocytosis, unspecified type  Thrombocytopenia [facility])    ED Discharge Orders    [name], [place], [place] 09/13/18 1846    Drenda Freeze, MD 09/13/18 2325

## 2018-09-13 NOTE — ED Triage Notes (Signed)
Pt presents with c/o left arm pain. Pt was here over the weekend and had an IV with dilantin. Her arm is now red, swollen, warm to the touch, and painful from finger to mid upper arm.

## 2018-09-13 NOTE — H&P (Signed)
History and Physical  Briana Sweeney OEU:[id number] DOB: [birth year] DOA: 09/13/2018 1621  Referring physician: Velia Meyer [facility] ED) PCP: [name], FNP   HISTORY   Chief Complaint: L hand and arm swelling and pain  HPI: Briana Sweeney is a 48 y.o. female with hx of alcoholic cirrhosis, depression, schizophrenia, prior alcohol withdrawals, hx of seizures, who presented with L hand and forearm swelling after recent ED encounter on 09/11/17. Patient had presented to ED with syncope on 09/11/17 and was reportedly intoxicated. She received IV dilantin load given in ED prior hx of seizures via peripheral IV inserted on L hand dorsum. She recalls feeling "burning" sensation during dilantin administration over the L hand. The next day when she woke up, patient noticed redness, warmth and swelling extending from L hand and into her forearm, up to her elbow. She took some ibuprofen with minimal relief. Also began experiencing chills and generalized malaise. Denies recent IV drug use. No respiratory sx.    Review of Systems:  + subjective chills - no cough - no chest pain, dyspnea on exertion - no edema, PND, orthopnea - no nausea/vomiting; no tarry, melanotic or bloody stools - no dysuria, increased urinary frequency - no weight changes  Rest of systems reviewed are negative, except as per above history.   ED course:  Vitals Blood pressure (!) 99/59, pulse 72, temperature 98.2 F (36.8 C), temperature source Oral, resp. rate 18, SpO2 97 %. Received   Past Medical History:  Diagnosis Date  . Anxiety   . Bipolar affective disorder ([place])    With anxiety features  . Cirrhosis of liver ([place])    Due to alcohol and hepatitis C  . Depression   . ETOHism ([place])   . GERD (gastroesophageal reflux disease)   . Hematemesis 02/10/2018  . Hepatitis C 2018   hepatitis c and alcohol related hepatitis  . Heroin abuse ([place])   . History of blood transfusion    "blood doesn't clot; I fell down and had to  have a transfusion"  . History of kidney stones   . Migraine    "when I get really stressed" (09/01/2017)  . Schizophrenia ([place])   . Seizures ([place])    "when I run out of my RX; lots recently" (09/01/2017)   Past Surgical History:  Procedure Laterality Date  . ESOPHAGOGASTRODUODENOSCOPY N/A 09/03/2017   Procedure: ESOPHAGOGASTRODUODENOSCOPY (EGD);  Surgeon: Doran Stabler, MD;  Location: [facility];  Service: Gastroenterology;  Laterality: N/A;  . FINGER FRACTURE SURGERY Left    "shattered my pinky"  . FRACTURE SURGERY    . IR PARACENTESIS  07/23/2017  . IR PARACENTESIS  07/2017   "did it twice in the same week" (09/01/2017)  . SHOULDER OPEN ROTATOR CUFF REPAIR Right   . TUBAL LIGATION    . VAGINAL HYSTERECTOMY      Social History:  reports that she has never smoked. She has never used smokeless tobacco. She reports current alcohol use of about 63.0 standard drinks of alcohol per week. She reports current drug use. Drug: Marijuana.  No Known Allergies  Family History  Problem Relation Age of Onset  . Lung cancer Mother 58  . Alcohol abuse Mother   . Throat cancer Father 53      Prior to Admission medications   Medication Sig Start Date End Date Taking? Authorizing Provider  busPIRone (BUSPAR) 10 MG tablet Take 1 tablet (10 mg total) by mouth 2 (two) times daily. 06/04/18  Yes  [name], FNP  FLUoxetine (PROZAC) 20 MG capsule Take 1 capsule (20 mg total) by mouth daily. 08/29/18  Yes Derrill Center, NP  folic acid (FOLVITE) 1 MG tablet Take 1 tablet (1 mg total) by mouth daily. 08/26/18  Yes Hongalgi, Lenis Dickinson, MD  furosemide (LASIX) 20 MG tablet Take 1 tablet (20 mg total) by mouth daily. 06/04/18  Yes [name], FNP  gabapentin (NEURONTIN) 100 MG capsule Take 2 capsules (200 mg total) by mouth 3 (three) times daily. 07/08/18  Yes [name], FNP  Multiple Vitamin (MULTIVITAMIN WITH MINERALS) TABS tablet Take 1 tablet by mouth daily. 08/26/18  Yes  Hongalgi, Lenis Dickinson, MD  phenytoin (DILANTIN) 300 MG ER capsule Take 300 mg by mouth daily.   Yes [provider]  spironolactone (ALDACTONE) 50 MG tablet Take 1 tablet (50 mg total) by mouth daily. 06/04/18  Yes [name], FNP  thiamine 100 MG tablet Take 1 tablet (100 mg total) by mouth daily. 08/26/18  Yes Hongalgi, Lenis Dickinson, MD  topiramate (TOPAMAX) 50 MG tablet Take 1 tablet (50 mg total) by mouth daily. 06/04/18  Yes [name], FNP  traZODone (DESYREL) 50 MG tablet Take 1 tablet (50 mg total) by mouth at bedtime as needed for sleep. 09/11/18  Yes [name], FNP  haloperidol (HALDOL) 5 MG tablet Take 1 tablet (5 mg total) by mouth 2 (two) times daily. Patient not taking: Reported on 09/13/2018 08/29/18   Derrill Center, NP  lactulose (CHRONULAC) 10 GM/15ML solution Take 30 mLs (20 g total) by mouth daily. Patient not taking: Reported on 08/22/2018 07/22/18   [name], FNP    PHYSICAL EXAM   Temp:  [98.2 F (36.8 C)-98.9 F (37.2 C)] 98.2 F (36.8 C) (01/05 1953) Pulse Rate:  [72-84] 72 (01/05 1953) Resp:  [16-18] 18 (01/05 1953) BP: (91-103)/(55-72) 99/59 (01/05 1953) SpO2:  [95 %-99 %] 97 % (01/05 1953)  BP (!) 99/59 (BP Location: Right Leg)   Pulse 72   Temp 98.2 F (36.8 C) (Oral)   Resp 18   SpO2 97%    GEN thin middle-aged caucasian female; resting in bed, not in acute distress, mod discomfort due to L arm pain  HEENT NCAT EOM intact PERRL; clear oropharynx, no cervical LAD; dry mucus membranes  JVP estimated 5 cm H2O above RA; no HJR ; no carotid bruits b/l ;  CV regular normal rate; normal S1 and S2; no m/r/g or S3/S4;   RESP CTA b/l; breathing unlabored and symmetric  ABD soft NT ND +normoactive BS  EXT warm throughout b/l; no peripheral edema b/l  PULSES  DP and radials 2+ intact b/l  SKIN/MSK  L upper extremity: Demarcated erythematous raised confluent rash over ~3cm wide extending from L hand and covering ventral forearm and 4 cm  past past elbow. Overall L forearm and hand is edematous. No drainage or pus or fluctuance over L dorsal hand (prior IV insertion is not clearly visible).   NEURO/PSYCH AAOx4; no focal deficits; upper extremities are tremulous; mild tongue tremor   DATA   LABS ON ADMISSION:  Basic Metabolic Panel: Recent Labs  Lab 09/11/18 2020 09/13/18 1705  NA 140 131*  K 4.0 3.5  CL 110 101  CO2 20* 22  GLUCOSE 107* 119*  BUN 6 10  CREATININE 0.69 0.89  CALCIUM 8.5* 8.2*   CBC: Recent Labs  Lab 09/11/18 2019 09/13/18 1705  WBC 5.5 11.5*  NEUTROABS  --  8.4*  HGB 13.6 12.9  HCT 43.3 40.4  MCV 89.6 88.0  PLT 50* 33*   Liver Function Tests: Recent Labs  Lab 09/11/18 2020 09/13/18 1705  AST 47* 31  ALT 34 27  ALKPHOS 106 89  BILITOT 0.6 2.0*  PROT 7.8 7.6  ALBUMIN 3.2* 3.1*   No results for input(s): LIPASE, AMYLASE in the last 168 hours. No results for input(s): AMMONIA in the last 168 hours. Coagulation:  Lab Results  Component Value Date   INR 1.08 08/22/2018   INR 1.32 08/03/2018   INR 1.23 06/01/2018   No results found for: PTT Lactic Acid, Venous:     Component Value Date/Time   LATICACIDVEN 1.79 09/13/2018 1720   Cardiac Enzymes: Recent Labs  Lab 09/13/18 1705  CKTOTAL 49   Urinalysis:    Component Value Date/Time   COLORURINE STRAW (A) 09/11/2018 2342   APPEARANCEUR CLEAR 09/11/2018 2342   LABSPEC 1.004 (L) 09/11/2018 2342   PHURINE 6.0 09/11/2018 [place] 09/11/2018 2342   HGBUR NEGATIVE 09/11/2018 2342   BILIRUBINUR NEGATIVE 09/11/2018 2342   BILIRUBINUR negative 07/22/2018 1209   BILIRUBINUR neg 02/20/2018 1248   [place] 09/11/2018 2342   PROTEINUR NEGATIVE 09/11/2018 2342   UROBILINOGEN 0.2 07/22/2018 1209   UROBILINOGEN 0.2 12/02/2017 0836   NITRITE NEGATIVE 09/11/2018 2342   LEUKOCYTESUR NEGATIVE 09/11/2018 2342    BNP (last 3 results) No results for input(s): PROBNP in the last 8760 hours. CBG: No results  for input(s): GLUCAP in the last 168 hours.  Radiological Exams on Admission: Dg Elbow Complete Left  Result Date: 09/11/2018 CLINICAL DATA:  Fall with left elbow pain. EXAM: LEFT ELBOW - COMPLETE 3+ VIEW COMPARISON:  None. FINDINGS: There is no evidence of fracture, dislocation, or joint effusion. There is no evidence of arthropathy or other focal bone abnormality. Soft tissue swelling over the posterior elbow and forearm. IMPRESSION: No acute fracture or dislocation. Electronically Signed   By: Marin Olp M.D.   On: 09/11/2018 20:54   Ct Head Wo Contrast  Result Date: 09/11/2018 CLINICAL DATA:  Syncope/fainting, cardiac etiol suspected EXAM: CT HEAD WITHOUT CONTRAST TECHNIQUE: Contiguous axial images were obtained from the base of the skull through the vertex without intravenous contrast. COMPARISON:  Multiple prior exams most recently 09/06/2018 FINDINGS: Brain: Streak artifact on the left from patient's earring. Unchanged atrophy. No intracranial hemorrhage, mass effect, or midline shift. No hydrocephalus. The basilar cisterns are patent. No evidence of territorial infarct or acute ischemia. No extra-axial or intracranial fluid collection. Vascular: No hyperdense vessel. Skull: No fracture or focal lesion. Sinuses/Orbits: Paranasal sinuses and mastoid air cells are clear. The visualized orbits are unremarkable. Other: None. IMPRESSION: 1. No acute intracranial abnormality. 2. Unchanged atrophy. Electronically Signed   By: Keith Rake M.D.   On: 09/11/2018 20:54   Ue Venous Duplex ([facility] And [facility] Only)  Result Date: 09/13/2018 UPPER VENOUS STUDY  Indications: Swelling, and Pain Performing Technologist: Abram Sander RVS  Examination Guidelines: A complete evaluation includes B-mode imaging, spectral Doppler, color Doppler, and power Doppler as needed of all accessible portions of each vessel. Bilateral testing is considered an integral part of a complete examination. Limited examinations for  reoccurring indications may be performed as noted.  Left Findings: +----------+------------+----------+---------+-----------+-------+ LEFT      CompressiblePropertiesPhasicitySpontaneousSummary +----------+------------+----------+---------+-----------+-------+ IJV           Full                 Yes  Yes            +----------+------------+----------+---------+-----------+-------+ Subclavian    Full                 Yes       Yes            +----------+------------+----------+---------+-----------+-------+ Axillary      Full                 Yes       Yes            +----------+------------+----------+---------+-----------+-------+ Brachial      Full                 Yes       Yes            +----------+------------+----------+---------+-----------+-------+ Radial        Full                                          +----------+------------+----------+---------+-----------+-------+ Ulnar         Full                                          +----------+------------+----------+---------+-----------+-------+ Cephalic      Full                                          +----------+------------+----------+---------+-----------+-------+ Basilic       Full                                          +----------+------------+----------+---------+-----------+-------+  Summary:  Left: No evidence of deep vein thrombosis in the upper extremity. No evidence of superficial vein thrombosis in the upper extremity.  *See table(s) above for measurements and observations.    Preliminary     EKG: Independently reviewed from 09/11/17: NSR with normal ST baseline  I have reviewed the patient's previous electronic chart records, labs, and other data.   ASSESSMENT AND PLAN   Assessment: Briana Sweeney is a 48 y.o. female with hx of alcoholic cirrhosis, depression, schizophrenia, prior alcohol withdrawals, hx of seizures, who presented with L hand and forearm swelling  concerning for erysipelas (more so than cellulitis, based on appearance) likely due to infected L hand IV site, which was used for dilantin load a few days prior. LUE doppler is negative for clot. Non toxic appearing. On review of baseline BP, patient's normal pressures have been in 59-163W systolics in the past. Will treat with IV abx overnight and pending clinical improvement may be suitable for PO abx starting tomorrow. Of note, patient currently residing at homeless shelter.   Active Problems:   Erysipelas  Plan:   # L hand and forearm superficial infection likely erysipelas vs cellulitis > no compartment syndrome on admission - blood cultures pending - s/p vanc and zosyn in ED - starting cefazolin 2gm IV q8h specifically for erysipelas per guidelines - NS bolus x 1 (note baseline BP in 46-659 systolics) - pain control with oxycodone-tylenol and low dose dilaudid for breakthrough - monitor closely for compartment syndrome  #  Hx of alcoholic cirrhosis and alcohol withdrawals > mild withdrawal sx while in ED - CIWA protocol with prn ativan - holding home spironolactone, lasix at this time given infection - lactulose prn for BM - serum etoh pending - folate and thiamine daily  # Hx of seizures, depression, and schizophrenia > no active sx reported at this time - resume home dilantin 300mg  ER nightly - resume trazodone nightly - resume prozac - resume gabapentin - resume buspar 10mg  bid - resume topamax 50mg  daily   # Chronic thrombocytopenia - longstanding likely 2/2 cirrhosis > admission platelets 33K near baseline - monitor daily CBC - no need for plt transfusion at this time unless < 25K   # Homelessness / financial barriers    - SW consult for medication access (ie PO abx)  DVT Prophylaxis: SCDs only given low platelets Code Status:  Full Code Family Communication: discussed plan with patient  Disposition Plan: admit to observation (floor) for clinical improvement  with abx   Patient contact: Extended Emergency Contact Information Primary Emergency Contact: Alinda Dooms Mobile Phone: [phone] Relation: Sister  Time spent: > 35 mins  Colbert Ewing, MD [facility] Pager [phone]  If 7PM-7AM, please contact night-coverage [email or web address] Password [facility] 09/13/2018, 8:17 PM

## 2018-09-13 NOTE — Progress Notes (Signed)
*  Preliminary Results* Left upper extremity venous duplex completed. Left upper extremity is negative for deep and superficial vein thrombosis.   Results given to Rn.  09/13/2018 5:05 PM  Abram Sander

## 2018-09-14 ENCOUNTER — Observation Stay (HOSPITAL_COMMUNITY): Payer: PRIVATE HEALTH INSURANCE

## 2018-09-14 ENCOUNTER — Encounter (HOSPITAL_COMMUNITY): Payer: Self-pay

## 2018-09-14 ENCOUNTER — Other Ambulatory Visit: Payer: Self-pay

## 2018-09-14 DIAGNOSIS — L03114 Cellulitis of left upper limb: Secondary | ICD-10-CM

## 2018-09-14 LAB — CBC WITH DIFFERENTIAL/PLATELET
Abs Immature Granulocytes: 0.03 10*3/uL (ref 0.00–0.07)
Basophils Absolute: 0 10*3/uL (ref 0.0–0.1)
Basophils Relative: 0 %
Eosinophils Absolute: 0.2 10*3/uL (ref 0.0–0.5)
Eosinophils Relative: 2 %
HCT: 36.1 % (ref 36.0–46.0)
Hemoglobin: 11.5 g/dL — ABNORMAL LOW (ref 12.0–15.0)
Immature Granulocytes: 0 %
Lymphocytes Relative: 14 %
Lymphs Abs: 1.1 10*3/uL (ref 0.7–4.0)
MCH: 28.3 pg (ref 26.0–34.0)
MCHC: 31.9 g/dL (ref 30.0–36.0)
MCV: 88.9 fL (ref 80.0–100.0)
Monocytes Absolute: 1.1 10*3/uL — ABNORMAL HIGH (ref 0.1–1.0)
Monocytes Relative: 14 %
Neutro Abs: 5.3 10*3/uL (ref 1.7–7.7)
Neutrophils Relative %: 70 %
Platelets: 29 10*3/uL — CL (ref 150–400)
RBC: 4.06 MIL/uL (ref 3.87–5.11)
RDW: 19.4 % — ABNORMAL HIGH (ref 11.5–15.5)
WBC: 7.7 10*3/uL (ref 4.0–10.5)
nRBC: 0 % (ref 0.0–0.2)

## 2018-09-14 LAB — BASIC METABOLIC PANEL
Anion gap: 7 (ref 5–15)
BUN: 7 mg/dL (ref 6–20)
CO2: 20 mmol/L — ABNORMAL LOW (ref 22–32)
Calcium: 7.8 mg/dL — ABNORMAL LOW (ref 8.9–10.3)
Chloride: 109 mmol/L (ref 98–111)
Creatinine, Ser: 0.64 mg/dL (ref 0.44–1.00)
GFR calc Af Amer: >60 mL/min (ref >60)
GFR calc non Af Amer: >60 mL/min (ref >60)
Glucose, Bld: 106 mg/dL — ABNORMAL HIGH (ref 70–99)
Potassium: 3.3 mmol/L — ABNORMAL LOW (ref 3.5–5.1)
Sodium: 136 mmol/L (ref 135–145)

## 2018-09-14 LAB — MAGNESIUM: Magnesium: 1.8 mg/dL (ref 1.7–2.4)

## 2018-09-14 MED ORDER — VANCOMYCIN HCL IN DEXTROSE 750-5 MG/150ML-% IV SOLN: 750.0000 mg | Freq: Two times a day (BID) | INTRAVENOUS | Status: DC

## 2018-09-14 MED ORDER — POTASSIUM CHLORIDE CRYS ER 20 MEQ PO TBCR
40.0000 meq | EXTENDED_RELEASE_TABLET | Freq: Once | ORAL | Status: AC
  Administered 2018-09-14: 40 meq via ORAL
  Filled 2018-09-14: qty 2

## 2018-09-14 MED ORDER — VANCOMYCIN HCL IN DEXTROSE 750-5 MG/150ML-% IV SOLN
750.0000 mg | Freq: Two times a day (BID) | INTRAVENOUS | Status: DC
  Administered 2018-09-14 – 2018-09-16 (×6): 750 mg via INTRAVENOUS
  Filled 2018-09-14 (×7): qty 150

## 2018-09-14 MED ORDER — VANCOMYCIN HCL 10 G IV SOLR
1500.0000 mg | Freq: Once | INTRAVENOUS | Status: DC
  Filled 2018-09-14: qty 1500

## 2018-09-14 NOTE — Progress Notes (Signed)
CRITICAL VALUE ALERT  Critical Value: platelets 29   Date & Time Notied:  09/14/18 at 0643  Provider Notified:hspitalist Bodenheimer notified via Amion  Orders Received/Actions taken: waiting for orders ,will continue to monitor.

## 2018-09-14 NOTE — Progress Notes (Signed)
Brought patient to MRI for MRI Elbow and Forearm wo/w. Pt was unable to lay in position for MRI. Pt needs to lay on stomach with arm above her head in Superman position. Patient was unable to lay more than 30 seconds. Patient continued to shake while on table. Pt needs to be able to lay in position for 45-90 minutes for the scan. Dr Tyrell Antonio has been paged. [facility] RN aware.

## 2018-09-14 NOTE — Telephone Encounter (Signed)
Left a detail vm for patient  

## 2018-09-14 NOTE — Progress Notes (Signed)
Pharmacy Antibiotic Note  Briana Sweeney is a 48 y.o. female admitted on 09/13/2018 with erysipelas.  Pharmacy has been consulted for vancomycin dosing. Pt on cefazolin, vancomycin being added for MRSA coverage.  Today, 09/14/18  WBC WNL  Pt on cefazolin per MD  SCr WNL, CrCl ~ 75 mL/min  Plan:  Cefazolin 2 g IV q8h per MD  Pt received vancomycin 1000 mg dose in ED last night (~12 hrs ago). Start vancomycin 750 mg IV q12h now   Goal AUC 400-500. Check vancomycin levels once at steady state if indicated  Follow renal function, culture data, clinical course     Temp (24hrs), Avg:98.5 F (36.9 C), Min:98.2 F (36.8 C), Max:98.9 F (37.2 C)  Recent Labs  Lab 09/11/18 2019 09/11/18 2020 09/13/18 1705 09/13/18 1720 09/14/18 0606  WBC 5.5  --  11.5*  --  7.7  CREATININE  --  0.69 0.89  --  0.64  LATICACIDVEN  --   --   --  1.79  --     Estimated Creatinine Clearance: 77 mL/min (by C-G formula based on SCr of 0.64 mg/dL).    No Known Allergies  Antimicrobials this admission: cefazolin 1/5 >>  vancomycin 1/5 >>   Dose adjustments this admission:  Microbiology results: 1/5 BCx: Sent  Thank you for allowing pharmacy to be a part of this patient's care.  Lenis Noon, PharmD 09/14/2018 10:44 AM

## 2018-09-14 NOTE — Plan of Care (Signed)
  Problem: Pain Management: Goal: Satisfaction with pain management regimen will be met by discharge Outcome: Progressing

## 2018-09-14 NOTE — Care Management Note (Signed)
Case Management Note  Patient Details  Name: Briana Sweeney MRN: [id number] Date of Birth: 05-Nov-1970  Subjective/Objective:                  Return to top of Cellulitis RRG - [place]  Discharge readiness is indicated by patient meeting Recovery Milestones, including ALL of the following: ? Hemodynamic stability=yes Skin exam stable or improved  L hand and arm swelling and pain/ 48 y.o. female with hx of alcoholic cirrhosis, depression, schizophrenia, prior alcohol withdrawals, hx of seizures, who presented with L hand and forearm swelling concerning for erysipelas (more so than cellulitis, based on appearance) likely due to infected L hand IV site, which was used for dilantin load a few days prior. LUE doppler is negative for clot. Non toxic appearing. On review of baseline BP, patient's normal pressures have been in 82-993Z systolics in the past. Will treat with IV abx overnight and pending clinical improvement may be suitable for PO abx starting tomorrow. Of note, patient currently residing at homeless shelter.  ?  ? Mental status at baseline no ? Fever absent or improved absent ? Antibiotic treatment needs appropriate for next level of care  Iv ancef 2 gms q8hrs ? Pain absent or manageable at next level of care no ? Ambulatory yes ? Oral hydration, medications,[M] and diet ? See above ?    Action/Plan:  Following for progression of care. Following for cm needs none present at this time.  Expected Discharge Date:                  Expected Discharge Plan:     In-House Referral:     Discharge planning Services     Post Acute Care Choice:    Choice offered to:     DME Arranged:    DME Agency:     HH Arranged:    HH Agency:     Status of Service:     If discussed at [employer], dates discussed:    Additional Comments:  Leeroy Cha, RN 09/14/2018, 9:46 AM

## 2018-09-14 NOTE — Progress Notes (Signed)
PROGRESS NOTE    Briana Sweeney  OMB:[id number] DOB: 12-07-1970 DOA: 09/13/2018 PCP: [name], FNP    Brief Narrative ;48 year old with past medical history significant for alcoholic cirrhosis, depression, schizophrenia, prior alcohol withdrawals, history of seizure who presents complaining of left hand and forearm swelling after recent ED encounter on 09/11/2018.  Patient presented with a syncope episode on 09/11/2017 was reportedly intoxicated.  She received IV Dilantin load in the ED due to prior history of seizures through the IV inserted in the left Hein.  She reported feeling burning sensation during Dilantin administration.  Following day she wake up with redness swelling in the left hand and into the forearm.   Assessment & Plan:   Active Problems:   Erysipelas  1-Left hand , forearm Cellulitis; with edema, redness;  Continue with IV Ancef. Will add vancomycin to cover for MRSA.  She has significant edema and redness.  Pulse present.  Monitor for compartment syndrome.  Doppler negative for DVT.  WBC trending down.   2-History of alcoholic cirrhosis. Alcohol withdrawal;  continue with ciwa.  Holding spironolactone in setting of infection.   3-History of seizure; depression, schizophrenia.  Continue with dilantin, trazodone, buspar, gabapentin.  Topomax.   4-Chronic thrombocytopenia;  Secondary to cirrhosis.  Follow trend.   Homelessness / financial barriers   - SW consult for medication access (ie PO abx)  Hypokalemia; replete orally.    Estimated body mass index is 25.61 kg/m as calculated from the following:   Height as of 09/12/18: 5\' 2"  (1.575 m).   Weight as of 09/12/18: 63.5 kg.   DVT prophylaxis: SCD Code Status: full code Family Communication: care dicussed with patient.  Disposition Plan: remain in the hospital for IV antibiotics for treatment of cellulitis.   Consultants:   none  Procedures:   Antimicrobials:   Vancomycin  1-06  Ancef   Subjective: She is complaining of left arm pain, 8/10 redness and edema same as yesterday.   Objective: Vitals:   09/13/18 1822 09/13/18 1901 09/13/18 1953 09/14/18 0603  BP: (!) 91/57 (!) 93/55 (!) 99/59 (!) 93/55  Pulse: 79 74 72 81  Resp:   18 18  Temp:   98.2 F (36.8 C) 98.4 F (36.9 C)  TempSrc:   Oral Oral  SpO2: 98% 95% 97% 98%    Intake/Output Summary (Last 24 hours) at 09/14/2018 1349 Last data filed at 09/14/2018 4536 Gross per 24 hour  Intake 0 ml  Output -  Net 0 ml   There were no vitals filed for this visit.  Examination:  General exam: Appears calm and comfortable  Respiratory system: Clear to auscultation. Respiratory effort normal. Cardiovascular system: S1 & S2 heard, RRR. No JVD, murmurs, rubs, gallops or clicks. No pedal edema. Gastrointestinal system: Abdomen is nondistended, soft and nontender. No organomegaly or masses felt. Normal bowel sounds heard. Central nervous system: Alert and oriented. No focal neurological deficits. Extremities: Symmetric 5 x 5 power. Left arm with edema, redness, pulse present.  Skin: No rashes, lesions or ulcers    Data Reviewed: I have personally reviewed following labs and imaging studies  CBC: Recent Labs  Lab 09/11/18 2019 09/13/18 1705 09/14/18 0606  WBC 5.5 11.5* 7.7  NEUTROABS  --  8.4* 5.3  HGB 13.6 12.9 11.5*  HCT 43.3 40.4 36.1  MCV 89.6 88.0 88.9  PLT 50* 33* 29*   Basic Metabolic Panel: Recent Labs  Lab 09/11/18 2020 09/13/18 1705 09/14/18 0606  NA 140 131* 136  K 4.0 3.5 3.3*  CL 110 101 109  CO2 20* 22 20*  GLUCOSE 107* 119* 106*  BUN 6 10 7   CREATININE 0.69 0.89 0.64  CALCIUM 8.5* 8.2* 7.8*  MG  --   --  1.8   GFR: Estimated Creatinine Clearance: 77 mL/min (by C-G formula based on SCr of 0.64 mg/dL). Liver Function Tests: Recent Labs  Lab 09/11/18 2020 09/13/18 1705  AST 47* 31  ALT 34 27  ALKPHOS 106 89  BILITOT 0.6 2.0*  PROT 7.8 7.6  ALBUMIN 3.2* 3.1*    No results for input(s): LIPASE, AMYLASE in the last 168 hours. No results for input(s): AMMONIA in the last 168 hours. Coagulation Profile: No results for input(s): INR, PROTIME in the last 168 hours. Cardiac Enzymes: Recent Labs  Lab 09/13/18 1705  CKTOTAL 49   BNP (last 3 results) No results for input(s): PROBNP in the last 8760 hours. HbA1C: No results for input(s): HGBA1C in the last 72 hours. CBG: No results for input(s): GLUCAP in the last 168 hours. Lipid Profile: No results for input(s): CHOL, HDL, LDLCALC, TRIG, CHOLHDL, LDLDIRECT in the last 72 hours. Thyroid Function Tests: No results for input(s): TSH, T4TOTAL, FREET4, T3FREE, THYROIDAB in the last 72 hours. Anemia Panel: No results for input(s): VITAMINB12, FOLATE, FERRITIN, TIBC, IRON, RETICCTPCT in the last 72 hours. Sepsis Labs: Recent Labs  Lab 09/13/18 1720  LATICACIDVEN 1.79    No results found for this or any previous visit (from the past 240 hour(s)).       Radiology Studies: Ue Venous Duplex ([facility] And [facility] Only)  Result Date: 09/13/2018 UPPER VENOUS STUDY  Indications: Swelling, and Pain Performing Technologist: Abram Sander RVS  Examination Guidelines: A complete evaluation includes B-mode imaging, spectral Doppler, color Doppler, and power Doppler as needed of all accessible portions of each vessel. Bilateral testing is considered an integral part of a complete examination. Limited examinations for reoccurring indications may be performed as noted.  Left Findings: +----------+------------+----------+---------+-----------+-------+ LEFT      CompressiblePropertiesPhasicitySpontaneousSummary +----------+------------+----------+---------+-----------+-------+ IJV           Full                 Yes       Yes            +----------+------------+----------+---------+-----------+-------+ Subclavian    Full                 Yes       Yes             +----------+------------+----------+---------+-----------+-------+ Axillary      Full                 Yes       Yes            +----------+------------+----------+---------+-----------+-------+ Brachial      Full                 Yes       Yes            +----------+------------+----------+---------+-----------+-------+ Radial        Full                                          +----------+------------+----------+---------+-----------+-------+ Ulnar         Full                                          +----------+------------+----------+---------+-----------+-------+  Cephalic      Full                                          +----------+------------+----------+---------+-----------+-------+ Basilic       Full                                          +----------+------------+----------+---------+-----------+-------+  Summary:  Left: No evidence of deep vein thrombosis in the upper extremity. No evidence of superficial vein thrombosis in the upper extremity.  *See table(s) above for measurements and observations.    Preliminary         Scheduled Meds: . busPIRone  10 mg Oral BID  . FLUoxetine  20 mg Oral Daily  . folic acid  1 mg Oral Daily  . gabapentin  200 mg Oral TID  . LORazepam  0-4 mg Oral Q6H   Followed by  . [START ON 09/15/2018] LORazepam  0-4 mg Oral Q12H  . multivitamin with minerals  1 tablet Oral Daily  . phenytoin  300 mg Oral Daily  . thiamine  100 mg Oral Daily  . topiramate  50 mg Oral Daily   Continuous Infusions: .  ceFAZolin (ANCEF) IV 2 g (09/14/18 0754)  . vancomycin 750 mg (09/14/18 1101)     LOS: 0 days    Time spent: 35 minutes.     Elmarie Shiley, MD [facility] Pager [phone]  If 7PM-7AM, please contact night-coverage [email or web address] Password [email or web address] 09/14/2018, 1:49 PM

## 2018-09-15 ENCOUNTER — Observation Stay (HOSPITAL_COMMUNITY): Payer: PRIVATE HEALTH INSURANCE

## 2018-09-15 DIAGNOSIS — I959 Hypotension, unspecified: Secondary | ICD-10-CM | POA: Diagnosis present

## 2018-09-15 DIAGNOSIS — Z90710 Acquired absence of both cervix and uterus: Secondary | ICD-10-CM | POA: Diagnosis not present

## 2018-09-15 DIAGNOSIS — K7010 Alcoholic hepatitis without ascites: Secondary | ICD-10-CM | POA: Diagnosis present

## 2018-09-15 DIAGNOSIS — F419 Anxiety disorder, unspecified: Secondary | ICD-10-CM | POA: Diagnosis present

## 2018-09-15 DIAGNOSIS — I8500 Esophageal varices without bleeding: Secondary | ICD-10-CM | POA: Diagnosis present

## 2018-09-15 DIAGNOSIS — D709 Neutropenia, unspecified: Secondary | ICD-10-CM | POA: Diagnosis not present

## 2018-09-15 DIAGNOSIS — L03114 Cellulitis of left upper limb: Secondary | ICD-10-CM

## 2018-09-15 DIAGNOSIS — M71122 Other infective bursitis, left elbow: Secondary | ICD-10-CM | POA: Diagnosis present

## 2018-09-15 DIAGNOSIS — D649 Anemia, unspecified: Secondary | ICD-10-CM | POA: Diagnosis present

## 2018-09-15 DIAGNOSIS — R569 Unspecified convulsions: Secondary | ICD-10-CM | POA: Diagnosis present

## 2018-09-15 DIAGNOSIS — M79632 Pain in left forearm: Secondary | ICD-10-CM | POA: Diagnosis present

## 2018-09-15 DIAGNOSIS — D6959 Other secondary thrombocytopenia: Secondary | ICD-10-CM | POA: Diagnosis present

## 2018-09-15 DIAGNOSIS — K7031 Alcoholic cirrhosis of liver with ascites: Secondary | ICD-10-CM | POA: Diagnosis present

## 2018-09-15 DIAGNOSIS — F10239 Alcohol dependence with withdrawal, unspecified: Secondary | ICD-10-CM | POA: Diagnosis present

## 2018-09-15 DIAGNOSIS — F1024 Alcohol dependence with alcohol-induced mood disorder: Secondary | ICD-10-CM | POA: Diagnosis present

## 2018-09-15 DIAGNOSIS — K219 Gastro-esophageal reflux disease without esophagitis: Secondary | ICD-10-CM | POA: Diagnosis present

## 2018-09-15 DIAGNOSIS — E876 Hypokalemia: Secondary | ICD-10-CM | POA: Diagnosis present

## 2018-09-15 DIAGNOSIS — F209 Schizophrenia, unspecified: Secondary | ICD-10-CM | POA: Diagnosis present

## 2018-09-15 DIAGNOSIS — G43909 Migraine, unspecified, not intractable, without status migrainosus: Secondary | ICD-10-CM | POA: Diagnosis present

## 2018-09-15 DIAGNOSIS — A46 Erysipelas: Secondary | ICD-10-CM | POA: Diagnosis present

## 2018-09-15 DIAGNOSIS — K5900 Constipation, unspecified: Secondary | ICD-10-CM | POA: Diagnosis present

## 2018-09-15 DIAGNOSIS — R609 Edema, unspecified: Secondary | ICD-10-CM | POA: Diagnosis present

## 2018-09-15 DIAGNOSIS — B1920 Unspecified viral hepatitis C without hepatic coma: Secondary | ICD-10-CM | POA: Diagnosis present

## 2018-09-15 DIAGNOSIS — F319 Bipolar disorder, unspecified: Secondary | ICD-10-CM | POA: Diagnosis present

## 2018-09-15 LAB — BASIC METABOLIC PANEL
Anion gap: 7 (ref 5–15)
BUN: 7 mg/dL (ref 6–20)
CO2: 20 mmol/L — ABNORMAL LOW (ref 22–32)
Calcium: 8.3 mg/dL — ABNORMAL LOW (ref 8.9–10.3)
Chloride: 109 mmol/L (ref 98–111)
Creatinine, Ser: 0.74 mg/dL (ref 0.44–1.00)
GFR calc Af Amer: >60 mL/min (ref >60)
GFR calc non Af Amer: >60 mL/min (ref >60)
Glucose, Bld: 97 mg/dL (ref 70–99)
Potassium: 3.8 mmol/L (ref 3.5–5.1)
Sodium: 136 mmol/L (ref 135–145)

## 2018-09-15 LAB — CBC WITH DIFFERENTIAL/PLATELET
Abs Immature Granulocytes: 0.03 10*3/uL (ref 0.00–0.07)
Basophils Absolute: 0 10*3/uL (ref 0.0–0.1)
Basophils Relative: 0 %
Eosinophils Absolute: 0.2 10*3/uL (ref 0.0–0.5)
Eosinophils Relative: 4 %
HCT: 35.9 % — ABNORMAL LOW (ref 36.0–46.0)
Hemoglobin: 11.4 g/dL — ABNORMAL LOW (ref 12.0–15.0)
Immature Granulocytes: 1 %
Lymphocytes Relative: 19 %
Lymphs Abs: 1 10*3/uL (ref 0.7–4.0)
MCH: 28.3 pg (ref 26.0–34.0)
MCHC: 31.8 g/dL (ref 30.0–36.0)
MCV: 89.1 fL (ref 80.0–100.0)
Monocytes Absolute: 0.6 10*3/uL (ref 0.1–1.0)
Monocytes Relative: 11 %
Neutro Abs: 3.5 10*3/uL (ref 1.7–7.7)
Neutrophils Relative %: 65 %
Platelets: 36 10*3/uL — ABNORMAL LOW (ref 150–400)
RBC: 4.03 MIL/uL (ref 3.87–5.11)
RDW: 19.7 % — ABNORMAL HIGH (ref 11.5–15.5)
WBC: 5.4 10*3/uL (ref 4.0–10.5)
nRBC: 0 % (ref 0.0–0.2)

## 2018-09-15 LAB — MAGNESIUM: Magnesium: 1.7 mg/dL (ref 1.7–2.4)

## 2018-09-15 MED ORDER — SODIUM CHLORIDE (PF) 0.9 % IJ SOLN
INTRAMUSCULAR | Status: AC
  Filled 2018-09-15: qty 50

## 2018-09-15 MED ORDER — IOHEXOL 300 MG/ML  SOLN
75.0000 mL | Freq: Once | INTRAMUSCULAR | Status: AC | PRN
  Administered 2018-09-15: 75 mL via INTRAVENOUS

## 2018-09-15 NOTE — Progress Notes (Signed)
PROGRESS NOTE    Briana Sweeney  EAV:[id number] DOB: 03-20-1971 DOA: 09/13/2018 PCP: [name], FNP    Brief Narrative ;48 year old with past medical history significant for alcoholic cirrhosis, depression, schizophrenia, prior alcohol withdrawals, history of seizure who presents complaining of left hand and forearm swelling after recent ED encounter on 09/11/2018.  Patient presented with a syncope episode on 09/11/2017 was reportedly intoxicated.  She received IV Dilantin load in the ED due to prior history of seizures through the IV inserted in the left Hein.  She reported feeling burning sensation during Dilantin administration.  Following day she wake up with redness swelling in the left hand and into the forearm.  Patient was admitted with left forearm and hand cellulitis.  Was a started on IV Ancef subsequently added vancomycin.  White blood cell has been coming down.  Edema and redness has slightly improved today.  She was not able to tolerate MRI we proceeded with CT today.  Assessment & Plan:   Active Problems:   Erysipelas  1-Left hand , forearm Cellulitis; with edema, redness;  Continue with IV Ancef. Will add vancomycin to cover for MRSA.  Edema and redness is slightly improved today.  I think we will need to continue with IV antibiotics. Pulses present. Monitor for compartment syndrome.  Compartment soft dark today. Doppler negative for DVT.  WBC trending down.   2-History of alcoholic cirrhosis. Alcohol withdrawal;  continue with ciwa.  Holding spironolactone in setting of infection.   3-History of seizure; depression, schizophrenia.  Continue with dilantin, trazodone, buspar, gabapentin.  Topomax.   4-Chronic thrombocytopenia;  Secondary to cirrhosis.  Follow trend.  Platelet count improving today at 36.  Homelessness / financial barriers   - SW consult for medication access (ie PO abx)  Hypokalemia; resolved   Estimated body mass index is 23.99 kg/m as  calculated from the following:   Height as of this encounter: 5\' 2"  (1.575 m).   Weight as of this encounter: 59.5 kg.   DVT prophylaxis: SCD Code Status: full code Family Communication: care dicussed with patient.  Disposition Plan: remain in the hospital for IV antibiotics for treatment of cellulitis.   Consultants:   none  Procedures:   Antimicrobials:   Vancomycin 1-06  Ancef   Subjective: Patient is still complaining significant forearm and hand pain.  Objective: Vitals:   09/14/18 2045 09/15/18 0000 09/15/18 0246 09/15/18 0629  BP: 121/78 120/80 108/68 108/72  Pulse: 93 88 87 77  Resp: 18  16 12   Temp: 98.5 F (36.9 C)  98.6 F (37 C) 98.1 F (36.7 C)  TempSrc: Oral  Oral Oral  SpO2: 95%  96% 96%  Weight: 59.5 kg     Height: 5\' 2"  (1.575 m)       Intake/Output Summary (Last 24 hours) at 09/15/2018 1556 Last data filed at 09/14/2018 1900 Gross per 24 hour  Intake 0 ml  Output -  Net 0 ml   Filed Weights   09/14/18 2045  Weight: 59.5 kg    Examination:  General exam: No acute distress Respiratory system: Clear to auscultation Cardiovascular system: S1, S2 regular rhythm and rate Gastrointestinal system: Bowel sounds present soft, nontender nondistended Central nervous system: Nonfocal mild tremors Extremities: Left arm with softer compartments, still with significant edema and redness Skin: No rashes, lesions or ulcers    Data Reviewed: I have personally reviewed following labs and imaging studies  CBC: Recent Labs  Lab 09/11/18 2019 09/13/18 1705 09/14/18 0606 09/15/18  0611  WBC 5.5 11.5* 7.7 5.4  NEUTROABS  --  8.4* 5.3 3.5  HGB 13.6 12.9 11.5* 11.4*  HCT 43.3 40.4 36.1 35.9*  MCV 89.6 88.0 88.9 89.1  PLT 50* 33* 29* 36*   Basic Metabolic Panel: Recent Labs  Lab 09/11/18 2020 09/13/18 1705 09/14/18 0606 09/15/18 0611  NA 140 131* 136 136  K 4.0 3.5 3.3* 3.8  CL 110 101 109 109  CO2 20* 22 20* 20*  GLUCOSE 107* 119* 106*  97  BUN 6 10 7 7   CREATININE 0.69 0.89 0.64 0.74  CALCIUM 8.5* 8.2* 7.8* 8.3*  MG  --   --  1.8 1.7   GFR: Estimated Creatinine Clearance: 69.5 mL/min (by C-G formula based on SCr of 0.74 mg/dL). Liver Function Tests: Recent Labs  Lab 09/11/18 2020 09/13/18 1705  AST 47* 31  ALT 34 27  ALKPHOS 106 89  BILITOT 0.6 2.0*  PROT 7.8 7.6  ALBUMIN 3.2* 3.1*   No results for input(s): LIPASE, AMYLASE in the last 168 hours. No results for input(s): AMMONIA in the last 168 hours. Coagulation Profile: No results for input(s): INR, PROTIME in the last 168 hours. Cardiac Enzymes: Recent Labs  Lab 09/13/18 1705  CKTOTAL 49   BNP (last 3 results) No results for input(s): PROBNP in the last 8760 hours. HbA1C: No results for input(s): HGBA1C in the last 72 hours. CBG: No results for input(s): GLUCAP in the last 168 hours. Lipid Profile: No results for input(s): CHOL, HDL, LDLCALC, TRIG, CHOLHDL, LDLDIRECT in the last 72 hours. Thyroid Function Tests: No results for input(s): TSH, T4TOTAL, FREET4, T3FREE, THYROIDAB in the last 72 hours. Anemia Panel: No results for input(s): VITAMINB12, FOLATE, FERRITIN, TIBC, IRON, RETICCTPCT in the last 72 hours. Sepsis Labs: Recent Labs  Lab 09/13/18 1720  LATICACIDVEN 1.79    Recent Results (from the past 240 hour(s))  Blood culture (routine x 2)     Status: None (Preliminary result)   Collection Time: 09/13/18  6:24 PM  Result Value Ref Range Status   Specimen Description   Final    BLOOD RIGHT ARM Performed at [facility], [place] [address] [place], [place] [address]    Special Requests   Final    BOTTLES DRAWN AEROBIC AND ANAEROBIC Blood Culture adequate volume Performed at [facility] [address] [place], [place] [address]    Culture   Final    NO GROWTH 1 DAY Performed at [facility], [place] [address], [place], [place] [address]    Report Status PENDING  Incomplete  Blood culture  (routine x 2)     Status: None (Preliminary result)   Collection Time: 09/13/18  6:29 PM  Result Value Ref Range Status   Specimen Description   Final    BLOOD RIGHT ANTECUBITAL Performed at [facility] [address], [place], [place] [address]    Special Requests   Final    BOTTLES DRAWN AEROBIC AND ANAEROBIC Blood Culture adequate volume Performed at [facility] [address], [place], [place] [address]    Culture   Final    NO GROWTH 1 DAY Performed at [facility], [place] [address], [place], [place] [address]    Report Status PENDING  Incomplete         Radiology Studies: Ct Forearm Left W Contrast  Result Date: 09/15/2018 CLINICAL DATA:  Left forearm and elbow pain and swelling. Left arm  is swollen and red. Patient has a fever. EXAM: CT OF THE LEFT ELBOW WITH CONTRAST CT OF THE LEFT FOREARM WITH CONTRAST TECHNIQUE: Multidetector CT imaging of the upper left elbow was performed according to the standard protocol following intravenous contrast administration. Multidetector CT imaging of the upper left forearm was performed according to the standard protocol following intravenous contrast administration. COMPARISON:  None. CONTRAST:  64mL OMNIPAQUE IOHEXOL 300 MG/ML  SOLN FINDINGS: Bones/Joint/Cartilage No fracture or dislocation. Normal alignment. No joint effusion. No periosteal reaction or bone destruction. Joint spaces are maintained. Ligaments Ligaments are suboptimally evaluated by CT. Muscles and Tendons Muscles are normal. No muscle atrophy. No intramuscular fluid collection or hematoma. Triceps tendon and biceps tendon are grossly intact. Soft tissue No fluid collection or hematoma. No soft tissue mass. Mild soft tissue edema along the dorsal aspect of the elbow and forearm with skin thickening concerning for cellulitis. IMPRESSION: 1. Mild soft tissue edema along the dorsal aspect of the elbow and forearm with skin thickening  concerning for cellulitis. No focal fluid collection to suggest an abscess. 2.  No acute osseous injury of the left elbow. 3.  No acute osseous injury of the left forearm. 4. No osteomyelitis of the left elbow and forearm. Electronically Signed   By: Kathreen Devoid   On: 09/15/2018 12:34   Ct Elbow Left W Contrast  Result Date: 09/15/2018 CLINICAL DATA:  Left forearm and elbow pain and swelling. Left arm is swollen and red. Patient has a fever. EXAM: CT OF THE LEFT ELBOW WITH CONTRAST CT OF THE LEFT FOREARM WITH CONTRAST TECHNIQUE: Multidetector CT imaging of the upper left elbow was performed according to the standard protocol following intravenous contrast administration. Multidetector CT imaging of the upper left forearm was performed according to the standard protocol following intravenous contrast administration. COMPARISON:  None. CONTRAST:  28mL OMNIPAQUE IOHEXOL 300 MG/ML  SOLN FINDINGS: Bones/Joint/Cartilage No fracture or dislocation. Normal alignment. No joint effusion. No periosteal reaction or bone destruction. Joint spaces are maintained. Ligaments Ligaments are suboptimally evaluated by CT. Muscles and Tendons Muscles are normal. No muscle atrophy. No intramuscular fluid collection or hematoma. Triceps tendon and biceps tendon are grossly intact. Soft tissue No fluid collection or hematoma. No soft tissue mass. Mild soft tissue edema along the dorsal aspect of the elbow and forearm with skin thickening concerning for cellulitis. IMPRESSION: 1. Mild soft tissue edema along the dorsal aspect of the elbow and forearm with skin thickening concerning for cellulitis. No focal fluid collection to suggest an abscess. 2.  No acute osseous injury of the left elbow. 3.  No acute osseous injury of the left forearm. 4. No osteomyelitis of the left elbow and forearm. Electronically Signed   By: Kathreen Devoid   On: 09/15/2018 12:34   Ue Venous Duplex (mc And Wl Only)  Result Date: 09/14/2018 UPPER VENOUS STUDY   Indications: Swelling, and Pain Performing Technologist: Abram Sander RVS  Examination Guidelines: A complete evaluation includes B-mode imaging, spectral Doppler, color Doppler, and power Doppler as needed of all accessible portions of each vessel. Bilateral testing is considered an integral part of a complete examination. Limited examinations for reoccurring indications may be performed as noted.  Left Findings: +----------+------------+----------+---------+-----------+-------+ LEFT      CompressiblePropertiesPhasicitySpontaneousSummary +----------+------------+----------+---------+-----------+-------+ IJV           Full                 Yes       Yes            +----------+------------+----------+---------+-----------+-------+  Subclavian    Full                 Yes       Yes            +----------+------------+----------+---------+-----------+-------+ Axillary      Full                 Yes       Yes            +----------+------------+----------+---------+-----------+-------+ Brachial      Full                 Yes       Yes            +----------+------------+----------+---------+-----------+-------+ Radial        Full                                          +----------+------------+----------+---------+-----------+-------+ Ulnar         Full                                          +----------+------------+----------+---------+-----------+-------+ Cephalic      Full                                          +----------+------------+----------+---------+-----------+-------+ Basilic       Full                                          +----------+------------+----------+---------+-----------+-------+  Summary:  Left: No evidence of deep vein thrombosis in the upper extremity. No evidence of superficial vein thrombosis in the upper extremity.  *See table(s) above for measurements and observations.  Diagnosing physician: Monica Martinez MD Electronically  signed by Monica Martinez MD on 09/14/2018 at 2:02:25 PM.    Final         Scheduled Meds: . busPIRone  10 mg Oral BID  . FLUoxetine  20 mg Oral Daily  . folic acid  1 mg Oral Daily  . gabapentin  200 mg Oral TID  . LORazepam  0-4 mg Oral Q6H   Followed by  . LORazepam  0-4 mg Oral Q12H  . multivitamin with minerals  1 tablet Oral Daily  . phenytoin  300 mg Oral Daily  . sodium chloride (PF)      . thiamine  100 mg Oral Daily  . topiramate  50 mg Oral Daily   Continuous Infusions: .  ceFAZolin (ANCEF) IV 2 g (09/15/18 1001)  . vancomycin 750 mg (09/15/18 1215)     LOS: 0 days    Time spent: 35 minutes.     Elmarie Shiley, MD [facility] Pager [phone]  If 7PM-7AM, please contact night-coverage [email or web address] Password [email or web address] 09/15/2018, 3:56 PM

## 2018-09-15 NOTE — Clinical Social Work Note (Signed)
Clinical Social Work Assessment  Patient Details  Name: Briana Sweeney MRN: [id number] Date of Birth: Dec 07, 1970  Date of referral:  09/15/18               Reason for consult:  Housing Concerns/Homelessness                Permission sought to share information with:  Facility Art therapist granted to share information::     Name::        Agency::  Shelter  Relationship::     Contact Information:     Housing/Transportation Living arrangements for the past 2 months:  Barrister's clerk of Information:  Patient Patient Interpreter Needed:  None Criminal Activity/Legal Involvement Pertinent to Current Situation/Hospitalization:  No - Comment as needed Significant Relationships:  Warehouse manager Lives with:  Self Do you feel safe going back to the place where you live?  Yes Need for family participation in patient care:  No (Coment)  Care giving concerns:  Briana Sweeney is a 48 y.o. female with hx of alcoholic cirrhosis, depression, schizophrenia, prior alcohol withdrawals, hx of seizures, who presented with L hand and forearm swelling after recent ED encounter on 09/11/17. Patient had presented to ED with syncope on 09/11/17 and was reportedly intoxicated. She received IV dilantin load given in ED prior hx of seizures via peripheral IV inserted on L hand dorsum. She recalls feeling "burning" sensation during dilantin administration over the L hand. The next day when she woke up, patient noticed redness, warmth and swelling extending from L hand and into her forearm, up to her elbow. She took some ibuprofen with minimal relief. Also began experiencing chills and generalized malaise. Denies recent IV drug use. No respiratory sx.    Social Worker assessment / plan:  LCSW consulted for homeless issues.   LCSW met with patient at bedside. Patient reports that she is in a shelter and has been there for a month. Patient was medicated and could not recall the name of the  shelter, but knew the location was [place].   LCSW contacted [facility] to confirm patients bed. Patient is currently at [facility]. LCSW notified shelter of patients hospital stay.    Patient reports that she currently has no income and is awaiting disability. Patient states she has had an appeal hearing and should receive it soon. She will be eligible for Medicaid once disability starts.   PLAN: patient will dc to shelter. LCSW can provide bus pass for transport if needed.   LCSW signing off no dc needs.     Employment status:  Unemployed Forensic scientist:  Self Pay (Medicaid Pending) PT Recommendations:  Not assessed at this time Information / Referral to community resources:     Patient/Family's Response to care:  Patient is thankful for LCSW visit.   Patient/Family's Understanding of and Emotional Response to Diagnosis, Current Treatment, and Prognosis:  Unable to assess. Patient was sedated and LCSW only assessed for homeless.   Emotional Assessment Appearance:  Appears older than stated age Attitude/Demeanor/Rapport:  Sedated Affect (typically observed):  Appropriate Orientation:  Oriented to Self, Oriented to Place, Oriented to  Time, Oriented to Situation Alcohol / Substance use:  Not Applicable(hx of drug use.) Psych involvement (Current and /or in the community):  No (Comment)(Hx of psych involvment)  Discharge Needs  Concerns to be addressed:  No discharge needs identified Readmission within the last 30 days:  Yes Current discharge risk:  None Barriers to Discharge:  Continued Medical Work up   [employer], LCSW 09/15/2018, 12:16 PM

## 2018-09-15 NOTE — Plan of Care (Signed)
Plan of care discussed.   

## 2018-09-16 DIAGNOSIS — K219 Gastro-esophageal reflux disease without esophagitis: Secondary | ICD-10-CM

## 2018-09-16 DIAGNOSIS — D696 Thrombocytopenia, unspecified: Secondary | ICD-10-CM

## 2018-09-16 DIAGNOSIS — F1024 Alcohol dependence with alcohol-induced mood disorder: Secondary | ICD-10-CM

## 2018-09-16 DIAGNOSIS — L03114 Cellulitis of left upper limb: Secondary | ICD-10-CM | POA: Diagnosis present

## 2018-09-16 DIAGNOSIS — K7031 Alcoholic cirrhosis of liver with ascites: Secondary | ICD-10-CM

## 2018-09-16 LAB — CBC WITH DIFFERENTIAL/PLATELET
Abs Immature Granulocytes: 0.01 10*3/uL (ref 0.00–0.07)
Basophils Absolute: 0 10*3/uL (ref 0.0–0.1)
Basophils Relative: 1 %
Eosinophils Absolute: 0.2 10*3/uL (ref 0.0–0.5)
Eosinophils Relative: 6 %
HCT: 38.8 % (ref 36.0–46.0)
Hemoglobin: 12.5 g/dL (ref 12.0–15.0)
Immature Granulocytes: 0 %
Lymphocytes Relative: 32 %
Lymphs Abs: 1.2 10*3/uL (ref 0.7–4.0)
MCH: 28.7 pg (ref 26.0–34.0)
MCHC: 32.2 g/dL (ref 30.0–36.0)
MCV: 89.2 fL (ref 80.0–100.0)
Monocytes Absolute: 0.4 10*3/uL (ref 0.1–1.0)
Monocytes Relative: 11 %
Neutro Abs: 1.9 10*3/uL (ref 1.7–7.7)
Neutrophils Relative %: 50 %
Platelets: 46 10*3/uL — ABNORMAL LOW (ref 150–400)
RBC: 4.35 MIL/uL (ref 3.87–5.11)
RDW: 19.6 % — ABNORMAL HIGH (ref 11.5–15.5)
WBC: 3.8 10*3/uL — ABNORMAL LOW (ref 4.0–10.5)
nRBC: 0 % (ref 0.0–0.2)

## 2018-09-16 LAB — BASIC METABOLIC PANEL
Anion gap: 7 (ref 5–15)
BUN: 5 mg/dL — ABNORMAL LOW (ref 6–20)
CO2: 19 mmol/L — ABNORMAL LOW (ref 22–32)
Calcium: 8.4 mg/dL — ABNORMAL LOW (ref 8.9–10.3)
Chloride: 111 mmol/L (ref 98–111)
Creatinine, Ser: 0.59 mg/dL (ref 0.44–1.00)
GFR calc Af Amer: >60 mL/min (ref >60)
GFR calc non Af Amer: >60 mL/min (ref >60)
Glucose, Bld: 99 mg/dL (ref 70–99)
Potassium: 3.7 mmol/L (ref 3.5–5.1)
Sodium: 137 mmol/L (ref 135–145)

## 2018-09-16 LAB — MAGNESIUM: Magnesium: 1.8 mg/dL (ref 1.7–2.4)

## 2018-09-16 MED ORDER — SPIRONOLACTONE 25 MG PO TABS
50.0000 mg | ORAL_TABLET | Freq: Every day | ORAL | Status: DC
  Administered 2018-09-16 – 2018-09-22 (×7): 50 mg via ORAL
  Filled 2018-09-16 (×7): qty 2

## 2018-09-16 MED ORDER — FAMOTIDINE 20 MG PO TABS
20.0000 mg | ORAL_TABLET | Freq: Every day | ORAL | Status: DC
  Administered 2018-09-16 – 2018-09-22 (×7): 20 mg via ORAL
  Filled 2018-09-16 (×7): qty 1

## 2018-09-16 MED ORDER — FUROSEMIDE 20 MG PO TABS
20.0000 mg | ORAL_TABLET | Freq: Every day | ORAL | Status: DC
  Administered 2018-09-16 – 2018-09-22 (×7): 20 mg via ORAL
  Filled 2018-09-16 (×7): qty 1

## 2018-09-16 MED ORDER — MAGNESIUM SULFATE 2 GM/50ML IV SOLN
2.0000 g | Freq: Once | INTRAVENOUS | Status: AC
  Administered 2018-09-16: 2 g via INTRAVENOUS
  Filled 2018-09-16: qty 50

## 2018-09-16 NOTE — Progress Notes (Signed)
PROGRESS NOTE    Briana Sweeney  LDJ:[id number] DOB: [birth year] DOA: 09/13/2018 PCP: [name], FNP    Brief Narrative:  48 year old with past medical history significant for alcoholic cirrhosis, depression, schizophrenia, prior alcohol withdrawals, history of seizure who presents complaining of left hand and forearm swelling after recent ED encounter on 09/11/2018.  Patient presented with a syncope episode on 09/11/2017 was reportedly intoxicated.  She received IV Dilantin load in the ED due to prior history of seizures through the IV inserted in the left Hein.  She reported feeling burning sensation during Dilantin administration.  Following day she wake up with redness swelling in the left hand and into the forearm.  Patient was admitted with left forearm and hand cellulitis.  Was a started on IV Ancef subsequently added vancomycin.  White blood cell has been coming down.  Edema and redness has slightly improved today.  She was not able to tolerate MRI we proceeded with CT today.    Assessment & Plan:   Principal Problem:   Cellulitis of left forearm Active Problems:   Hepatitis C antibody positive in blood   Alcoholic cirrhosis of liver with ascites (HCC)   Esophageal varices without bleeding (HCC)   Alcohol abuse with alcohol-induced mood disorder (HCC)   Alcohol dependence (HCC)   GERD (gastroesophageal reflux disease)   Chronic anemia   Erysipelas  1-Left hand , forearm Cellulitis; with edema, redness;  Questionable etiology.  Patient prior to admission had left upper extremity Dopplers done which were negative for DVT.  Patient still with some erythema, warmth, tenderness to palpation and edema.  Patient currently on IV Ancef and IV vancomycin was added for MRSA coverage.  Leukocytosis is trending down.  No signs of compartment syndrome at this time.  Continue empiric IV Ancef and IV vancomycin and reassess in the morning.  If continued improvement will transition from IV  vancomycin to oral doxycycline.  Follow.   2-History of alcoholic cirrhosis. Alcohol withdrawal;  Currently stable.  Patient with no signs of withdrawal.  Continue the Ativan CIWA protocol.  Will resume home regimen of Lasix and spironolactone.  Follow.  3-History of seizure; depression, schizophrenia.  Patient with no seizures noted.  Currently stable.  Continue home regimen of Dilantin, BuSpar, trazodone, gabapentin, Topamax.  Outpatient follow-up.   4-Chronic thrombocytopenia;  Secondary to cirrhosis.  Patient with no overt bleeding.  Platelet count at 46.  Follow.   5.Homelessness / financial barriers  - SW consult for medication access (ie PO abx) and disposition planning.  6. Hypokalemia;  repleted.  Magnesium at 1.8.  We will give magnesium sulfate 2 g IV x1.  Follow.  7.  Gastroesophageal reflux disease Pepcid     DVT prophylaxis: SCDs Code Status: Full Family Communication: Updated patient.  No family at bedside. Disposition Plan: Likely back to homeless shelter when clinically improved.   Consultants:   None  Procedures:   CT left forearm 09/15/2018  CT left elbow 09/15/2018  Antimicrobials:   IV vancomycin 09/14/2018  IV Ancef 09/14/2018   Subjective: Patient sitting up in bed.  Some complaints of left upper extremity pain.  States no significant improvement with left upper extremity from admission.  Denies any chest pain or shortness of breath.  Patient denies any overt bleeding.  Objective: Vitals:   09/15/18 0629 09/15/18 2118 09/16/18 0604 09/16/18 1417  BP: 108/72 122/83 113/83 110/87  Pulse: 77 94 78 98  Resp: 12 19 16    Temp: 98.1 F (36.7 C)  98 F (36.7 C) (!) 97.2 F (36.2 C) 98 F (36.7 C)  TempSrc: Oral   Oral  SpO2: 96% 96% 97% 97%  Weight:      Height:        Intake/Output Summary (Last 24 hours) at 09/16/2018 1819 Last data filed at 09/16/2018 1800 Gross per 24 hour  Intake 1558.84 ml  Output -  Net 1558.84 ml   Filed  Weights   09/14/18 2045  Weight: 59.5 kg    Examination:  General exam: Appears calm and comfortable  Respiratory system: Clear to auscultation. Respiratory effort normal. Cardiovascular system: S1 & S2 heard, RRR. No JVD, murmurs, rubs, gallops or clicks. No pedal edema. Gastrointestinal system: Abdomen is nondistended, soft and nontender. No organomegaly or masses felt. Normal bowel sounds heard. Central nervous system: Alert and oriented. No focal neurological deficits. Extremities: Left upper extremity with some warmth, some edema, erythema, some tenderness to palpation, soft.  Skin: No rashes, lesions or ulcers Psychiatry: Judgement and insight appear normal. Mood & affect appropriate.     Data Reviewed: I have personally reviewed following labs and imaging studies  CBC: Recent Labs  Lab 09/11/18 2019 09/13/18 1705 09/14/18 0606 09/15/18 0611 09/16/18 0559  WBC 5.5 11.5* 7.7 5.4 3.8*  NEUTROABS  --  8.4* 5.3 3.5 1.9  HGB 13.6 12.9 11.5* 11.4* 12.5  HCT 43.3 40.4 36.1 35.9* 38.8  MCV 89.6 88.0 88.9 89.1 89.2  PLT 50* 33* 29* 36* 46*   Basic Metabolic Panel: Recent Labs  Lab 09/11/18 2020 09/13/18 1705 09/14/18 0606 09/15/18 0611 09/16/18 0559  NA 140 131* 136 136 137  K 4.0 3.5 3.3* 3.8 3.7  CL 110 101 109 109 111  CO2 20* 22 20* 20* 19*  GLUCOSE 107* 119* 106* 97 99  BUN 6 10 7 7  5*  CREATININE 0.69 0.89 0.64 0.74 0.59  CALCIUM 8.5* 8.2* 7.8* 8.3* 8.4*  MG  --   --  1.8 1.7 1.8   GFR: Estimated Creatinine Clearance: 69.5 mL/min (by C-G formula based on SCr of 0.59 mg/dL). Liver Function Tests: Recent Labs  Lab 09/11/18 2020 09/13/18 1705  AST 47* 31  ALT 34 27  ALKPHOS 106 89  BILITOT 0.6 2.0*  PROT 7.8 7.6  ALBUMIN 3.2* 3.1*   No results for input(s): LIPASE, AMYLASE in the last 168 hours. No results for input(s): AMMONIA in the last 168 hours. Coagulation Profile: No results for input(s): INR, PROTIME in the last 168 hours. Cardiac  Enzymes: Recent Labs  Lab 09/13/18 1705  CKTOTAL 49   BNP (last 3 results) No results for input(s): PROBNP in the last 8760 hours. HbA1C: No results for input(s): HGBA1C in the last 72 hours. CBG: No results for input(s): GLUCAP in the last 168 hours. Lipid Profile: No results for input(s): CHOL, HDL, LDLCALC, TRIG, CHOLHDL, LDLDIRECT in the last 72 hours. Thyroid Function Tests: No results for input(s): TSH, T4TOTAL, FREET4, T3FREE, THYROIDAB in the last 72 hours. Anemia Panel: No results for input(s): VITAMINB12, FOLATE, FERRITIN, TIBC, IRON, RETICCTPCT in the last 72 hours. Sepsis Labs: Recent Labs  Lab 09/13/18 1720  LATICACIDVEN 1.79    Recent Results (from the past 240 hour(s))  Blood culture (routine x 2)     Status: None (Preliminary result)   Collection Time: 09/13/18  6:24 PM  Result Value Ref Range Status   Specimen Description   Final    BLOOD RIGHT ARM Performed at [facility], [address]  [place], [place] [address]    Special Requests   Final    BOTTLES DRAWN AEROBIC AND ANAEROBIC Blood Culture adequate volume Performed at [facility] [address], [place], [place] [address]    Culture   Final    NO GROWTH 2 DAYS Performed at [facility] [address], [place], [place] [address]    Report Status PENDING  Incomplete  Blood culture (routine x 2)     Status: None (Preliminary result)   Collection Time: 09/13/18  6:29 PM  Result Value Ref Range Status   Specimen Description   Final    BLOOD RIGHT ANTECUBITAL Performed at [facility] [address] [place], [place] [address]    Special Requests   Final    BOTTLES DRAWN AEROBIC AND ANAEROBIC Blood Culture adequate volume Performed at [facility] [address] [place], [place] [address]    Culture   Final    NO GROWTH 2 DAYS Performed at [facility] [address] [place], [place] [address]     Report Status PENDING  Incomplete         Radiology Studies: Ct Forearm Left W Contrast  Result Date: 09/15/2018 CLINICAL DATA:  Left forearm and elbow pain and swelling. Left arm is swollen and red. Patient has a fever. EXAM: CT OF THE LEFT ELBOW WITH CONTRAST CT OF THE LEFT FOREARM WITH CONTRAST TECHNIQUE: Multidetector CT imaging of the upper left elbow was performed according to the standard protocol following intravenous contrast administration. Multidetector CT imaging of the upper left forearm was performed according to the standard protocol following intravenous contrast administration. COMPARISON:  None. CONTRAST:  65mL OMNIPAQUE IOHEXOL 300 MG/ML  SOLN FINDINGS: Bones/Joint/Cartilage No fracture or dislocation. Normal alignment. No joint effusion. No periosteal reaction or bone destruction. Joint spaces are maintained. Ligaments Ligaments are suboptimally evaluated by CT. Muscles and Tendons Muscles are normal. No muscle atrophy. No intramuscular fluid collection or hematoma. Triceps tendon and biceps tendon are grossly intact. Soft tissue No fluid collection or hematoma. No soft tissue mass. Mild soft tissue edema along the dorsal aspect of the elbow and forearm with skin thickening concerning for cellulitis. IMPRESSION: 1. Mild soft tissue edema along the dorsal aspect of the elbow and forearm with skin thickening concerning for cellulitis. No focal fluid collection to suggest an abscess. 2.  No acute osseous injury of the left elbow. 3.  No acute osseous injury of the left forearm. 4. No osteomyelitis of the left elbow and forearm. Electronically Signed   By: Kathreen Devoid   On: 09/15/2018 12:34   Ct Elbow Left W Contrast  Result Date: 09/15/2018 CLINICAL DATA:  Left forearm and elbow pain and swelling. Left arm is swollen and red. Patient has a fever. EXAM: CT OF THE LEFT ELBOW WITH CONTRAST CT OF THE LEFT FOREARM WITH CONTRAST TECHNIQUE: Multidetector CT imaging of the upper left elbow was  performed according to the standard protocol following intravenous contrast administration. Multidetector CT imaging of the upper left forearm was performed according to the standard protocol following intravenous contrast administration. COMPARISON:  None. CONTRAST:  46mL OMNIPAQUE IOHEXOL 300 MG/ML  SOLN FINDINGS: Bones/Joint/Cartilage No fracture or dislocation. Normal alignment. No joint effusion. No periosteal reaction or bone destruction. Joint spaces are maintained. Ligaments Ligaments are suboptimally evaluated by CT. Muscles and Tendons Muscles are normal. No muscle atrophy. No intramuscular fluid collection or hematoma. Triceps tendon and biceps tendon are grossly intact.  Soft tissue No fluid collection or hematoma. No soft tissue mass. Mild soft tissue edema along the dorsal aspect of the elbow and forearm with skin thickening concerning for cellulitis. IMPRESSION: 1. Mild soft tissue edema along the dorsal aspect of the elbow and forearm with skin thickening concerning for cellulitis. No focal fluid collection to suggest an abscess. 2.  No acute osseous injury of the left elbow. 3.  No acute osseous injury of the left forearm. 4. No osteomyelitis of the left elbow and forearm. Electronically Signed   By: Kathreen Devoid   On: 09/15/2018 12:34        Scheduled Meds: . busPIRone  10 mg Oral BID  . FLUoxetine  20 mg Oral Daily  . folic acid  1 mg Oral Daily  . furosemide  20 mg Oral Daily  . gabapentin  200 mg Oral TID  . LORazepam  0-4 mg Oral Q12H  . multivitamin with minerals  1 tablet Oral Daily  . phenytoin  300 mg Oral Daily  . spironolactone  50 mg Oral Daily  . thiamine  100 mg Oral Daily  . topiramate  50 mg Oral Daily   Continuous Infusions: .  ceFAZolin (ANCEF) IV Stopped (09/16/18 1640)  . vancomycin 750 mg (09/16/18 1151)     LOS: 1 day    Time spent: 35 minutes    Irine Seal, MD [facility]  If 7PM-7AM, please contact  night-coverage [email or web address] 09/16/2018, 6:19 PM

## 2018-09-17 LAB — CBC WITH DIFFERENTIAL/PLATELET
Abs Immature Granulocytes: 0.01 10*3/uL (ref 0.00–0.07)
Basophils Absolute: 0 10*3/uL (ref 0.0–0.1)
Basophils Relative: 1 %
Eosinophils Absolute: 0.2 10*3/uL (ref 0.0–0.5)
Eosinophils Relative: 6 %
HCT: 40.2 % (ref 36.0–46.0)
Hemoglobin: 12.7 g/dL (ref 12.0–15.0)
Immature Granulocytes: 0 %
Lymphocytes Relative: 33 %
Lymphs Abs: 0.9 10*3/uL (ref 0.7–4.0)
MCH: 28.4 pg (ref 26.0–34.0)
MCHC: 31.6 g/dL (ref 30.0–36.0)
MCV: 89.9 fL (ref 80.0–100.0)
Monocytes Absolute: 0.3 10*3/uL (ref 0.1–1.0)
Monocytes Relative: 9 %
Neutro Abs: 1.4 10*3/uL — ABNORMAL LOW (ref 1.7–7.7)
Neutrophils Relative %: 51 %
Platelets: 52 10*3/uL — ABNORMAL LOW (ref 150–400)
RBC: 4.47 MIL/uL (ref 3.87–5.11)
RDW: 19.9 % — ABNORMAL HIGH (ref 11.5–15.5)
WBC: 2.8 10*3/uL — ABNORMAL LOW (ref 4.0–10.5)
nRBC: 0 % (ref 0.0–0.2)

## 2018-09-17 LAB — BASIC METABOLIC PANEL
Anion gap: 7 (ref 5–15)
BUN: 5 mg/dL — ABNORMAL LOW (ref 6–20)
CO2: 20 mmol/L — ABNORMAL LOW (ref 22–32)
Calcium: 8.4 mg/dL — ABNORMAL LOW (ref 8.9–10.3)
Chloride: 106 mmol/L (ref 98–111)
Creatinine, Ser: 0.67 mg/dL (ref 0.44–1.00)
GFR calc Af Amer: >60 mL/min (ref >60)
GFR calc non Af Amer: >60 mL/min (ref >60)
Glucose, Bld: 133 mg/dL — ABNORMAL HIGH (ref 70–99)
Potassium: 3.8 mmol/L (ref 3.5–5.1)
Sodium: 133 mmol/L — ABNORMAL LOW (ref 135–145)

## 2018-09-17 LAB — MAGNESIUM: Magnesium: 1.8 mg/dL (ref 1.7–2.4)

## 2018-09-17 LAB — PHENYTOIN LEVEL, TOTAL: Phenytoin Lvl: 5.2 ug/mL — ABNORMAL LOW (ref 10.0–20.0)

## 2018-09-17 MED ORDER — LACTULOSE 10 GM/15ML PO SOLN
20.0000 g | Freq: Every day | ORAL | Status: DC
  Administered 2018-09-17 – 2018-09-22 (×6): 20 g via ORAL
  Filled 2018-09-17 (×6): qty 30

## 2018-09-17 MED ORDER — CHLORDIAZEPOXIDE HCL 25 MG PO CAPS
25.0000 mg | ORAL_CAPSULE | Freq: Every day | ORAL | Status: AC
  Administered 2018-09-20: 25 mg via ORAL
  Filled 2018-09-17 (×2): qty 1

## 2018-09-17 MED ORDER — LOPERAMIDE HCL 2 MG PO CAPS: 2.0000 mg | ORAL_CAPSULE | ORAL | Status: AC | PRN

## 2018-09-17 MED ORDER — HYDROXYZINE HCL 25 MG PO TABS
25.0000 mg | ORAL_TABLET | Freq: Four times a day (QID) | ORAL | Status: AC | PRN
  Administered 2018-09-20: 25 mg via ORAL
  Filled 2018-09-17: qty 1

## 2018-09-17 MED ORDER — CHLORDIAZEPOXIDE HCL 25 MG PO CAPS
25.0000 mg | ORAL_CAPSULE | ORAL | Status: AC
  Administered 2018-09-19 (×2): 25 mg via ORAL
  Filled 2018-09-17: qty 1

## 2018-09-17 MED ORDER — VITAMIN B-1 100 MG PO TABS: 100.0000 mg | ORAL_TABLET | Freq: Every day | ORAL | Status: DC

## 2018-09-17 MED ORDER — DOXYCYCLINE HYCLATE 100 MG PO TABS
100.0000 mg | ORAL_TABLET | Freq: Two times a day (BID) | ORAL | Status: DC
  Administered 2018-09-17 – 2018-09-18 (×3): 100 mg via ORAL
  Filled 2018-09-17 (×3): qty 1

## 2018-09-17 MED ORDER — MAGNESIUM SULFATE 4 GM/100ML IV SOLN
4.0000 g | Freq: Once | INTRAVENOUS | Status: AC
  Administered 2018-09-17: 4 g via INTRAVENOUS
  Filled 2018-09-17: qty 100

## 2018-09-17 MED ORDER — OXYCODONE HCL 5 MG PO TABS
5.0000 mg | ORAL_TABLET | ORAL | Status: DC | PRN
  Administered 2018-09-17 (×2): 10 mg via ORAL
  Administered 2018-09-18: 5 mg via ORAL
  Administered 2018-09-18 – 2018-09-20 (×8): 10 mg via ORAL
  Filled 2018-09-17 (×5): qty 2
  Filled 2018-09-17: qty 1
  Filled 2018-09-17 (×6): qty 2

## 2018-09-17 MED ORDER — CHLORDIAZEPOXIDE HCL 25 MG PO CAPS: 25.0000 mg | ORAL_CAPSULE | Freq: Four times a day (QID) | ORAL | Status: AC | PRN

## 2018-09-17 MED ORDER — CHLORDIAZEPOXIDE HCL 25 MG PO CAPS
25.0000 mg | ORAL_CAPSULE | Freq: Four times a day (QID) | ORAL | Status: AC
  Administered 2018-09-17 (×4): 25 mg via ORAL
  Filled 2018-09-17 (×4): qty 1

## 2018-09-17 MED ORDER — ONDANSETRON 4 MG PO TBDP: 4.0000 mg | ORAL_TABLET | Freq: Four times a day (QID) | ORAL | Status: AC | PRN

## 2018-09-17 MED ORDER — CHLORDIAZEPOXIDE HCL 25 MG PO CAPS
25.0000 mg | ORAL_CAPSULE | Freq: Three times a day (TID) | ORAL | Status: AC
  Administered 2018-09-18 (×3): 25 mg via ORAL
  Filled 2018-09-17 (×3): qty 1

## 2018-09-17 NOTE — Progress Notes (Signed)
Pt. Refused bed alarm regardless of safety education.

## 2018-09-17 NOTE — Care Management Note (Signed)
Case Management Note  Patient Details  Name: Briana Sweeney MRN: [id number] Date of Birth: 06-07-1971  Subjective/Objective:   cellulitis                Discharge readiness is indicated by patient meeting Recovery Milestones, including ALL of the following: ? Hemodynamic stability  yes ? Skin exam stable or improved  improving ? Mental status at baseline no etoh w/d ? Fever absent or improved absent ? Antibiotic treatment needs appropriate for next level of care ? No iv aNCEF, IV VANCOMYCIN  ? Pain absent or manageable at next level of care rec'ing dilaudid q4hrs ? Ambulatory yes ? Oral hydration, medications,[M] and diet ? Reg, see above for meds ? Not ready for dc ith iv abx   Action/Plan: Will follow for progression of care and clinical status. Will follow for case management needs none present at this time.  Expected Discharge Date:                  Expected Discharge Plan:     In-House Referral:     Discharge planning Services     Post Acute Care Choice:    Choice offered to:     DME Arranged:    DME Agency:     HH Arranged:    HH Agency:     Status of Service:     If discussed at [employer], dates discussed:    Additional Comments:  Leeroy Cha, RN 09/17/2018, 8:47 AM

## 2018-09-17 NOTE — Progress Notes (Signed)
PROGRESS NOTE    Briana Sweeney  TOI:[id number] DOB: 1971-09-05 DOA: 09/13/2018 PCP: [name], FNP    Brief Narrative:  48 year old with past medical history significant for alcoholic cirrhosis, depression, schizophrenia, prior alcohol withdrawals, history of seizure who presents complaining of left hand and forearm swelling after recent ED encounter on 09/11/2018.  Patient presented with a syncope episode on 09/11/2017 was reportedly intoxicated.  She received IV Dilantin load in the ED due to prior history of seizures through the IV inserted in the left Hein.  She reported feeling burning sensation during Dilantin administration.  Following day she wake up with redness swelling in the left hand and into the forearm.  Patient was admitted with left forearm and hand cellulitis.  Was a started on IV Ancef subsequently added vancomycin.  White blood cell has been coming down.  Edema and redness has slightly improved today.  She was not able to tolerate MRI we proceeded with CT today.    Assessment & Plan:   Principal Problem:   Cellulitis of left forearm Active Problems:   Hepatitis C antibody positive in blood   Alcoholic cirrhosis of liver with ascites (HCC)   Esophageal varices without bleeding (HCC)   Alcohol abuse with alcohol-induced mood disorder (HCC)   Alcohol dependence (HCC)   GERD (gastroesophageal reflux disease)   Chronic anemia   Erysipelas  1-Left hand , forearm Cellulitis; with edema, redness;  Questionable etiology.  Patient prior to admission had left upper extremity Dopplers done which were negative for DVT.  Patient still with some erythema, warmth, tenderness to palpation and edema.  Patient currently on IV Ancef and IV vancomycin was added for MRSA coverage.  Leukocytosis is trending down.  No signs of compartment syndrome at this time.  Continue empiric IV Ancef and discontinue IV vancomycin and placed on oral doxycycline.  If continued improvement will likely  transition from IV Ancef to oral Keflex tomorrow. Follow.   2-History of alcoholic cirrhosis. Alcohol withdrawal;  Patient with tremors noted.  Concern for alcohol withdrawal.  Discontinue the Ativan CIWA protocol.  Place on the Librium withdrawal protocol.  Continue home regimen Lasix and spironolactone.  Resume home dose lactulose.  Follow.   3-History of seizure; depression, schizophrenia.  Patient with no seizures noted.  Currently stable.  Continue home regimen of Dilantin, BuSpar, trazodone, gabapentin, Topamax.  Outpatient follow-up.   4-Chronic thrombocytopenia;  Secondary to cirrhosis.  Patient with no overt bleeding.  Platelet count slowly trending up currently at 52.  Follow.   5.Homelessness / financial barriers  - SW consult for medication access (ie PO abx) and disposition planning.  6. Hypokalemia;  potassium repleted.  Magnesium at 1.8.  Give magnesium sulfate 4 g IV x1.  Follow.   7.  Gastroesophageal reflux disease Continue Pepcid     DVT prophylaxis: SCDs Code Status: Full Family Communication: Updated patient.  No family at bedside. Disposition Plan: Likely back to homeless shelter when clinically improved.   Consultants:   None  Procedures:   CT left forearm 09/15/2018  CT left elbow 09/15/2018  Antimicrobials:   IV vancomycin 09/14/2018>>>>09/17/2018  IV Ancef 09/14/2018  Doxycycline 09/17/2018   Subjective: Patient laying in bed.  Tremors noted.  Patient complaining of pain in left upper extremity.  No chest pain.  No shortness of breath.  Sitting up in bed.  Some complaints of left upper extremity pain.  States no significant improvement with left upper extremity from admission.  Denies any chest pain or  shortness of breath.  Patient denies any overt bleeding.  Objective: Vitals:   09/16/18 0604 09/16/18 1417 09/16/18 1930 09/17/18 0501  BP: 113/83 110/87 (!) 123/93 121/82  Pulse: 78 98 88 82  Resp: 16  18 18   Temp: (!) 97.2 F (36.2 C) 98  F (36.7 C) 98.1 F (36.7 C) 97.8 F (36.6 C)  TempSrc:  Oral Oral Oral  SpO2: 97% 97% 96% 96%  Weight:      Height:        Intake/Output Summary (Last 24 hours) at 09/17/2018 1017 Last data filed at 09/16/2018 1800 Gross per 24 hour  Intake 828.84 ml  Output -  Net 828.84 ml   Filed Weights   09/14/18 2045  Weight: 59.5 kg    Examination:  General exam: Tremors. Respiratory system: Lungs clear to auscultation bilaterally.  No wheezes, no crackles, no rhonchi.  Respiratory effort normal. Cardiovascular system: Regular rate and rhythm no murmurs rubs or gallops.  No JVD.  No lower extremity edema.  Gastrointestinal system: Abdomen is soft, nontender, nondistended, positive bowel sounds.  No rebound.  No guarding. Central nervous system: Alert and oriented. No focal neurological deficits. Extremities: Left upper extremity with some warmth, decreasing edema, less erythema, some tenderness to palpation, soft.  Skin: No rashes, lesions or ulcers Psychiatry: Judgement and insight appear normal. Mood & affect appropriate.     Data Reviewed: I have personally reviewed following labs and imaging studies  CBC: Recent Labs  Lab 09/13/18 1705 09/14/18 0606 09/15/18 0611 09/16/18 0559 09/17/18 0805  WBC 11.5* 7.7 5.4 3.8* 2.8*  NEUTROABS 8.4* 5.3 3.5 1.9 1.4*  HGB 12.9 11.5* 11.4* 12.5 12.7  HCT 40.4 36.1 35.9* 38.8 40.2  MCV 88.0 88.9 89.1 89.2 89.9  PLT 33* 29* 36* 46* 52*   Basic Metabolic Panel: Recent Labs  Lab 09/13/18 1705 09/14/18 0606 09/15/18 0611 09/16/18 0559 09/17/18 0805  NA 131* 136 136 137 133*  K 3.5 3.3* 3.8 3.7 3.8  CL 101 109 109 111 106  CO2 22 20* 20* 19* 20*  GLUCOSE 119* 106* 97 99 133*  BUN 10 7 7  5* 5*  CREATININE 0.89 0.64 0.74 0.59 0.67  CALCIUM 8.2* 7.8* 8.3* 8.4* 8.4*  MG  --  1.8 1.7 1.8 1.8   GFR: Estimated Creatinine Clearance: 69.5 mL/min (by C-G formula based on SCr of 0.67 mg/dL). Liver Function Tests: Recent Labs  Lab  09/11/18 2020 09/13/18 1705  AST 47* 31  ALT 34 27  ALKPHOS 106 89  BILITOT 0.6 2.0*  PROT 7.8 7.6  ALBUMIN 3.2* 3.1*   No results for input(s): LIPASE, AMYLASE in the last 168 hours. No results for input(s): AMMONIA in the last 168 hours. Coagulation Profile: No results for input(s): INR, PROTIME in the last 168 hours. Cardiac Enzymes: Recent Labs  Lab 09/13/18 1705  CKTOTAL 49   BNP (last 3 results) No results for input(s): PROBNP in the last 8760 hours. HbA1C: No results for input(s): HGBA1C in the last 72 hours. CBG: No results for input(s): GLUCAP in the last 168 hours. Lipid Profile: No results for input(s): CHOL, HDL, LDLCALC, TRIG, CHOLHDL, LDLDIRECT in the last 72 hours. Thyroid Function Tests: No results for input(s): TSH, T4TOTAL, FREET4, T3FREE, THYROIDAB in the last 72 hours. Anemia Panel: No results for input(s): VITAMINB12, FOLATE, FERRITIN, TIBC, IRON, RETICCTPCT in the last 72 hours. Sepsis Labs: Recent Labs  Lab 09/13/18 1720  LATICACIDVEN 1.79    Recent Results (from the past 240  hour(s))  Blood culture (routine x 2)     Status: None (Preliminary result)   Collection Time: 09/13/18  6:24 PM  Result Value Ref Range Status   Specimen Description   Final    BLOOD RIGHT ARM Performed at [facility] [address], [place], [place] [address]    Special Requests   Final    BOTTLES DRAWN AEROBIC AND ANAEROBIC Blood Culture adequate volume Performed at [facility] [address] [place], [place] [address]    Culture   Final    NO GROWTH 3 DAYS Performed at [facility], [place] [address], [place], [place] [address]    Report Status PENDING  Incomplete  Blood culture (routine x 2)     Status: None (Preliminary result)   Collection Time: 09/13/18  6:29 PM  Result Value Ref Range Status   Specimen Description   Final    BLOOD RIGHT ANTECUBITAL Performed at [facility]  [address] [place], [place] [address]    Special Requests   Final    BOTTLES DRAWN AEROBIC AND ANAEROBIC Blood Culture adequate volume Performed at [facility] [address], [place], [place] [address]    Culture   Final    NO GROWTH 3 DAYS Performed at [facility], [place] [address] [place], [place] [address]    Report Status PENDING  Incomplete         Radiology Studies: Ct Forearm Left W Contrast  Result Date: 09/15/2018 CLINICAL DATA:  Left forearm and elbow pain and swelling. Left arm is swollen and red. Patient has a fever. EXAM: CT OF THE LEFT ELBOW WITH CONTRAST CT OF THE LEFT FOREARM WITH CONTRAST TECHNIQUE: Multidetector CT imaging of the upper left elbow was performed according to the standard protocol following intravenous contrast administration. Multidetector CT imaging of the upper left forearm was performed according to the standard protocol following intravenous contrast administration. COMPARISON:  None. CONTRAST:  60mL OMNIPAQUE IOHEXOL 300 MG/ML  SOLN FINDINGS: Bones/Joint/Cartilage No fracture or dislocation. Normal alignment. No joint effusion. No periosteal reaction or bone destruction. Joint spaces are maintained. Ligaments Ligaments are suboptimally evaluated by CT. Muscles and Tendons Muscles are normal. No muscle atrophy. No intramuscular fluid collection or hematoma. Triceps tendon and biceps tendon are grossly intact. Soft tissue No fluid collection or hematoma. No soft tissue mass. Mild soft tissue edema along the dorsal aspect of the elbow and forearm with skin thickening concerning for cellulitis. IMPRESSION: 1. Mild soft tissue edema along the dorsal aspect of the elbow and forearm with skin thickening concerning for cellulitis. No focal fluid collection to suggest an abscess. 2.  No acute osseous injury of the left elbow. 3.  No acute osseous injury of the left forearm. 4. No osteomyelitis of the left elbow and forearm. Electronically  Signed   By: Kathreen Devoid   On: 09/15/2018 12:34   Ct Elbow Left W Contrast  Result Date: 09/15/2018 CLINICAL DATA:  Left forearm and elbow pain and swelling. Left arm is swollen and red. Patient has a fever. EXAM: CT OF THE LEFT ELBOW WITH CONTRAST CT OF THE LEFT FOREARM WITH CONTRAST TECHNIQUE: Multidetector CT imaging of the upper left elbow was performed according to the standard protocol following intravenous contrast administration. Multidetector CT imaging of the upper left forearm was performed according to the standard protocol following intravenous contrast administration. COMPARISON:  None. CONTRAST:  25mL OMNIPAQUE IOHEXOL 300 MG/ML  SOLN FINDINGS: Bones/Joint/Cartilage No fracture or dislocation. Normal alignment. No joint effusion. No periosteal reaction or bone destruction. Joint spaces are maintained. Ligaments Ligaments are suboptimally evaluated by CT. Muscles and Tendons Muscles are normal. No muscle atrophy. No intramuscular fluid collection or hematoma. Triceps tendon and biceps tendon are grossly intact. Soft tissue No fluid collection or hematoma. No soft tissue mass. Mild soft tissue edema along the dorsal aspect of the elbow and forearm with skin thickening concerning for cellulitis. IMPRESSION: 1. Mild soft tissue edema along the dorsal aspect of the elbow and forearm with skin thickening concerning for cellulitis. No focal fluid collection to suggest an abscess. 2.  No acute osseous injury of the left elbow. 3.  No acute osseous injury of the left forearm. 4. No osteomyelitis of the left elbow and forearm. Electronically Signed   By: Kathreen Devoid   On: 09/15/2018 12:34        Scheduled Meds: . busPIRone  10 mg Oral BID  . famotidine  20 mg Oral Daily  . FLUoxetine  20 mg Oral Daily  . folic acid  1 mg Oral Daily  . furosemide  20 mg Oral Daily  . gabapentin  200 mg Oral TID  . LORazepam  0-4 mg Oral Q12H  . multivitamin with minerals  1 tablet Oral Daily  . phenytoin   300 mg Oral Daily  . spironolactone  50 mg Oral Daily  . thiamine  100 mg Oral Daily  . topiramate  50 mg Oral Daily   Continuous Infusions: .  ceFAZolin (ANCEF) IV Stopped (09/17/18 0932)  . magnesium sulfate 1 - 4 g bolus IVPB 4 g (09/17/18 0957)  . vancomycin 750 mg (09/16/18 2246)     LOS: 2 days    Time spent: 35 minutes    Irine Seal, MD [facility]  If 7PM-7AM, please contact night-coverage [email or web address] 09/17/2018, 10:17 AM

## 2018-09-18 ENCOUNTER — Encounter (HOSPITAL_COMMUNITY)
Admission: EM | Disposition: A | Discharge status: Home / Self Care | Payer: Self-pay | Source: Home / Self Care | Attending: Internal Medicine

## 2018-09-18 ENCOUNTER — Inpatient Hospital Stay (HOSPITAL_COMMUNITY): Payer: PRIVATE HEALTH INSURANCE | Admitting: Anesthesiology

## 2018-09-18 ENCOUNTER — Encounter (HOSPITAL_COMMUNITY): Payer: Self-pay | Admitting: *Deleted

## 2018-09-18 ENCOUNTER — Inpatient Hospital Stay (HOSPITAL_COMMUNITY): Payer: PRIVATE HEALTH INSURANCE

## 2018-09-18 HISTORY — PX: I & D EXTREMITY: SHX5045

## 2018-09-18 LAB — BASIC METABOLIC PANEL
Anion gap: 9 (ref 5–15)
BUN: 8 mg/dL (ref 6–20)
CO2: 23 mmol/L (ref 22–32)
Calcium: 8.4 mg/dL — ABNORMAL LOW (ref 8.9–10.3)
Chloride: 104 mmol/L (ref 98–111)
Creatinine, Ser: 0.63 mg/dL (ref 0.44–1.00)
GFR calc Af Amer: >60 mL/min (ref >60)
GFR calc non Af Amer: >60 mL/min (ref >60)
Glucose, Bld: 92 mg/dL (ref 70–99)
Potassium: 3.9 mmol/L (ref 3.5–5.1)
Sodium: 136 mmol/L (ref 135–145)

## 2018-09-18 LAB — CBC WITH DIFFERENTIAL/PLATELET
Abs Immature Granulocytes: 0.01 10*3/uL (ref 0.00–0.07)
Basophils Absolute: 0 10*3/uL (ref 0.0–0.1)
Basophils Relative: 1 %
Eosinophils Absolute: 0.2 10*3/uL (ref 0.0–0.5)
Eosinophils Relative: 5 %
HCT: 40 % (ref 36.0–46.0)
Hemoglobin: 12.7 g/dL (ref 12.0–15.0)
Immature Granulocytes: 0 %
Lymphocytes Relative: 43 %
Lymphs Abs: 1.3 10*3/uL (ref 0.7–4.0)
MCH: 28.2 pg (ref 26.0–34.0)
MCHC: 31.8 g/dL (ref 30.0–36.0)
MCV: 88.9 fL (ref 80.0–100.0)
Monocytes Absolute: 0.4 10*3/uL (ref 0.1–1.0)
Monocytes Relative: 15 %
Neutro Abs: 1.1 10*3/uL — ABNORMAL LOW (ref 1.7–7.7)
Neutrophils Relative %: 36 %
Platelets: 53 10*3/uL — ABNORMAL LOW (ref 150–400)
RBC: 4.5 MIL/uL (ref 3.87–5.11)
RDW: 19.7 % — ABNORMAL HIGH (ref 11.5–15.5)
WBC: 2.9 10*3/uL — ABNORMAL LOW (ref 4.0–10.5)
nRBC: 0 % (ref 0.0–0.2)

## 2018-09-18 LAB — MAGNESIUM: Magnesium: 2 mg/dL (ref 1.7–2.4)

## 2018-09-18 SURGERY — IRRIGATION AND DEBRIDEMENT EXTREMITY
Anesthesia: General | Site: Elbow | Laterality: Left

## 2018-09-18 MED ORDER — FLUMAZENIL 0.5 MG/5ML IV SOLN
INTRAVENOUS | Status: AC
  Filled 2018-09-18: qty 5

## 2018-09-18 MED ORDER — VANCOMYCIN HCL 10 G IV SOLR
1250.0000 mg | INTRAVENOUS | Status: AC
  Administered 2018-09-18: 1250 mg via INTRAVENOUS
  Filled 2018-09-18: qty 1250

## 2018-09-18 MED ORDER — BUPIVACAINE HCL (PF) 0.5 % IJ SOLN
INTRAMUSCULAR | Status: AC
  Filled 2018-09-18: qty 30

## 2018-09-18 MED ORDER — VANCOMYCIN HCL 10 G IV SOLR
1250.0000 mg | INTRAVENOUS | Status: DC
  Administered 2018-09-19 – 2018-09-21 (×3): 1250 mg via INTRAVENOUS
  Filled 2018-09-18 (×4): qty 1250

## 2018-09-18 MED ORDER — MIDAZOLAM HCL 2 MG/2ML IJ SOLN
INTRAMUSCULAR | Status: DC | PRN
  Administered 2018-09-18: 2 mg via INTRAVENOUS

## 2018-09-18 MED ORDER — ONDANSETRON HCL 4 MG/2ML IJ SOLN
INTRAMUSCULAR | Status: AC
  Filled 2018-09-18: qty 2

## 2018-09-18 MED ORDER — FENTANYL CITRATE (PF) 100 MCG/2ML IJ SOLN
INTRAMUSCULAR | Status: DC | PRN
  Administered 2018-09-18 (×2): 50 ug via INTRAVENOUS

## 2018-09-18 MED ORDER — DEXAMETHASONE SODIUM PHOSPHATE 10 MG/ML IJ SOLN
INTRAMUSCULAR | Status: AC
  Filled 2018-09-18: qty 1

## 2018-09-18 MED ORDER — LIDOCAINE 2% (20 MG/ML) 5 ML SYRINGE
INTRAMUSCULAR | Status: DC | PRN
  Administered 2018-09-18: 100 mg via INTRAVENOUS

## 2018-09-18 MED ORDER — DEXMEDETOMIDINE HCL 200 MCG/2ML IV SOLN
INTRAVENOUS | Status: DC | PRN
  Administered 2018-09-18: 8 ug via INTRAVENOUS
  Administered 2018-09-18: 16 ug via INTRAVENOUS
  Administered 2018-09-18 (×2): 4 ug via INTRAVENOUS

## 2018-09-18 MED ORDER — PROMETHAZINE HCL 25 MG/ML IJ SOLN: 6.2500 mg | INTRAMUSCULAR | Status: DC | PRN

## 2018-09-18 MED ORDER — FENTANYL CITRATE (PF) 100 MCG/2ML IJ SOLN
INTRAMUSCULAR | Status: AC
  Filled 2018-09-18: qty 2

## 2018-09-18 MED ORDER — LACTATED RINGERS IV SOLN
INTRAVENOUS | Status: DC
  Administered 2018-09-19: 01:00:00 via INTRAVENOUS

## 2018-09-18 MED ORDER — PROPOFOL 10 MG/ML IV BOLUS
INTRAVENOUS | Status: AC
  Filled 2018-09-18: qty 20

## 2018-09-18 MED ORDER — 0.9 % SODIUM CHLORIDE (POUR BTL) OPTIME
TOPICAL | Status: DC | PRN
  Administered 2018-09-18: 1000 mL

## 2018-09-18 MED ORDER — HYDROCODONE-ACETAMINOPHEN 7.5-325 MG PO TABS: 1.0000 | ORAL_TABLET | Freq: Once | ORAL | Status: DC | PRN

## 2018-09-18 MED ORDER — BUPIVACAINE HCL (PF) 0.5 % IJ SOLN
INTRAMUSCULAR | Status: DC | PRN
  Administered 2018-09-18: 10 mL

## 2018-09-18 MED ORDER — EPINEPHRINE PF 1 MG/ML IJ SOLN
INTRAMUSCULAR | Status: AC
  Filled 2018-09-18: qty 3

## 2018-09-18 MED ORDER — PROPOFOL 10 MG/ML IV BOLUS
INTRAVENOUS | Status: DC | PRN
  Administered 2018-09-18: 200 mg via INTRAVENOUS

## 2018-09-18 MED ORDER — VANCOMYCIN HCL IN DEXTROSE 750-5 MG/150ML-% IV SOLN: 750.0000 mg | Freq: Two times a day (BID) | INTRAVENOUS | Status: DC

## 2018-09-18 MED ORDER — LIDOCAINE 2% (20 MG/ML) 5 ML SYRINGE
INTRAMUSCULAR | Status: AC
  Filled 2018-09-18: qty 5

## 2018-09-18 MED ORDER — DEXAMETHASONE SODIUM PHOSPHATE 10 MG/ML IJ SOLN
INTRAMUSCULAR | Status: DC | PRN
  Administered 2018-09-18: 8 mg via INTRAVENOUS

## 2018-09-18 MED ORDER — MEPERIDINE HCL 50 MG/ML IJ SOLN: 6.2500 mg | INTRAMUSCULAR | Status: DC | PRN

## 2018-09-18 MED ORDER — ACETAMINOPHEN 10 MG/ML IV SOLN
1000.0000 mg | Freq: Once | INTRAVENOUS | Status: DC | PRN
  Administered 2018-09-18: 1000 mg via INTRAVENOUS

## 2018-09-18 MED ORDER — SODIUM CHLORIDE 0.9 % IV SOLN
INTRAVENOUS | Status: AC
  Filled 2018-09-18: qty 500000

## 2018-09-18 MED ORDER — ACETAMINOPHEN 10 MG/ML IV SOLN
INTRAVENOUS | Status: AC
  Administered 2018-09-18: 1000 mg via INTRAVENOUS
  Filled 2018-09-18: qty 100

## 2018-09-18 MED ORDER — ONDANSETRON HCL 4 MG/2ML IJ SOLN
INTRAMUSCULAR | Status: DC | PRN
  Administered 2018-09-18: 4 mg via INTRAVENOUS

## 2018-09-18 MED ORDER — HYDROMORPHONE HCL 1 MG/ML IJ SOLN: 0.2500 mg | INTRAMUSCULAR | Status: DC | PRN

## 2018-09-18 MED ORDER — PHENYLEPHRINE 40 MCG/ML (10ML) SYRINGE FOR IV PUSH (FOR BLOOD PRESSURE SUPPORT)
PREFILLED_SYRINGE | INTRAVENOUS | Status: DC | PRN
  Administered 2018-09-18: 120 ug via INTRAVENOUS

## 2018-09-18 MED ORDER — CEFAZOLIN SODIUM-DEXTROSE 2-3 GM-%(50ML) IV SOLR
INTRAVENOUS | Status: DC | PRN
  Administered 2018-09-18: 2 g via INTRAVENOUS

## 2018-09-18 MED ORDER — LACTATED RINGERS IV SOLN
INTRAVENOUS | Status: DC | PRN
  Administered 2018-09-18: 18:00:00 via INTRAVENOUS

## 2018-09-18 MED ORDER — MIDAZOLAM HCL 2 MG/2ML IJ SOLN
INTRAMUSCULAR | Status: AC
  Filled 2018-09-18: qty 2

## 2018-09-18 SURGICAL SUPPLY — 35 items
BAG ZIPLOCK 12X15 (MISCELLANEOUS) ×2 IMPLANT
BANDAGE ACE 4X5 VEL STRL LF (GAUZE/BANDAGES/DRESSINGS) ×2 IMPLANT
BNDG COHESIVE 4X5 TAN STRL (GAUZE/BANDAGES/DRESSINGS) ×2 IMPLANT
BNDG GAUZE ELAST 4 BULKY (GAUZE/BANDAGES/DRESSINGS) ×2 IMPLANT
CANNULA VESSEL W/WING WO/VALVE (CANNULA) ×2 IMPLANT
CLEANER TIP ELECTROSURG 2X2 (MISCELLANEOUS) ×2 IMPLANT
CORD BIPOLAR FORCEPS 12FT (ELECTRODE) ×2 IMPLANT
COVER SURGICAL LIGHT HANDLE (MISCELLANEOUS) ×2 IMPLANT
CUFF TOURN SGL QUICK 18 (TOURNIQUET CUFF) ×2 IMPLANT
DRAIN PENROSE 18X1/2 LTX STRL (DRAIN) ×2 IMPLANT
DRAPE SURG 17X11 SM STRL (DRAPES) ×1 IMPLANT
ELECT REM PT RETURN 15FT ADLT (MISCELLANEOUS) ×2 IMPLANT
GAUZE PACKING IODOFORM 1/4X15 (GAUZE/BANDAGES/DRESSINGS) ×2 IMPLANT
GAUZE SPONGE 4X4 12PLY STRL (GAUZE/BANDAGES/DRESSINGS) ×2 IMPLANT
GLOVE BIO SURGEON STRL SZ7.5 (GLOVE) ×4 IMPLANT
GLOVE BIOGEL PI IND STRL 7.0 (GLOVE) IMPLANT
GLOVE BIOGEL PI IND STRL 7.5 (GLOVE) IMPLANT
GLOVE BIOGEL PI IND STRL 8 (GLOVE) ×1 IMPLANT
GLOVE BIOGEL PI INDICATOR 7.0 (GLOVE) ×2
GLOVE BIOGEL PI INDICATOR 7.5 (GLOVE) ×1
GLOVE BIOGEL PI INDICATOR 8 (GLOVE) ×1
GOWN SPEC L3 XXLG W/TWL (GOWN DISPOSABLE) ×2 IMPLANT
GOWN STRL REUS W/TWL LRG LVL3 (GOWN DISPOSABLE) ×1 IMPLANT
HANDPIECE INTERPULSE COAX TIP (DISPOSABLE) ×1
KIT BASIN OR (CUSTOM PROCEDURE TRAY) ×2 IMPLANT
MANIFOLD NEPTUNE II (INSTRUMENTS) ×2 IMPLANT
PACK ORTHO EXTREMITY (CUSTOM PROCEDURE TRAY) ×2 IMPLANT
PAD ABD 8X10 STRL (GAUZE/BANDAGES/DRESSINGS) ×1 IMPLANT
PAD CAST 4YDX4 CTTN HI CHSV (CAST SUPPLIES) ×1 IMPLANT
PADDING CAST COTTON 4X4 STRL (CAST SUPPLIES) ×1
SET HNDPC FAN SPRY TIP SCT (DISPOSABLE) ×1 IMPLANT
SUT ETHILON 4 0 PS 2 18 (SUTURE) ×2 IMPLANT
SUT SILK 4 0 PS 2 (SUTURE) ×2 IMPLANT
SYR 20CC LL (SYRINGE) ×2 IMPLANT
SYR CONTROL 10ML LL (SYRINGE) ×2 IMPLANT

## 2018-09-18 NOTE — Transfer of Care (Signed)
Immediate Anesthesia Transfer of Care Note  Patient: Briana Sweeney  Procedure(s) Performed: IRRIGATION AND DEBRIDEMENT EXTREMITY (Left Elbow)  Patient Location: PACU  Anesthesia Type:General  Level of Consciousness: sedated  Airway & Oxygen Therapy: Patient Spontanous Breathing and Patient connected to face mask oxygen  Post-op Assessment: Report given to RN and Post -op Vital signs reviewed and stable  Post vital signs: Reviewed and stable  Last Vitals:  Vitals Value Taken Time  BP    Temp    Pulse 61 09/18/2018  6:41 PM  Resp    SpO2 94 % 09/18/2018  6:41 PM  Vitals shown include unvalidated device data.  Last Pain:  Vitals:   09/18/18 1731  TempSrc: Oral  PainSc: 8       Patients Stated Pain Goal: 5 (66/29/47 6546)  Complications: No apparent anesthesia complications

## 2018-09-18 NOTE — Progress Notes (Signed)
Wound culture from I&D on left arm, result came back with few WBC present and no organisms seen (see result on file), provider on call made aware. Will cont to ffup if there is any order.

## 2018-09-18 NOTE — Progress Notes (Signed)
Pharmacy Antibiotic Note  Briana Sweeney is a 48 y.o. female admitted on 09/13/2018 with erysipelas.  Pharmacy has been consulted for vancomycin dosing. Pt on cefazolin.   This is day #5 of antibiotics. Pt on cefazolin and doxycycline currently. Doxycycline discontinued and vancomycin being restarted today.  Today, 09/18/18  WBC 2.9  SCr WNL, CrCl ~ 70 mL/min  Afebrile  Pt has been off of vancomycin for > 24 hours. Last dose charted 1/8 PM.  Plan:  Pt on cefazolin 2 g IV q8h per MD  Will start vancomycin 1250 mg IV q24h (stack doses) for expected AUC of 470  Goal AUC 400-500. Check vancomycin levels once at steady state if indicated  Follow renal function, culture data, clinical course  Height: 5\' 2"  (157.5 cm) Weight: 131 lb 2.8 oz (59.5 kg) IBW/kg (Calculated) : 50.1  Temp (24hrs), Avg:98.2 F (36.8 C), Min:97.7 F (36.5 C), Max:98.8 F (37.1 C)  Recent Labs  Lab 09/13/18 1720 09/14/18 0606 09/15/18 0611 09/16/18 0559 09/17/18 0805 09/18/18 0714  WBC  --  7.7 5.4 3.8* 2.8* 2.9*  CREATININE  --  0.64 0.74 0.59 0.67 0.63  LATICACIDVEN 1.79  --   --   --   --   --     Estimated Creatinine Clearance: 69.5 mL/min (by C-G formula based on SCr of 0.63 mg/dL).    No Known Allergies  Antimicrobials this admission: cefazolin 1/5 >>  vancomycin 1/5 > 1/8 then resumed 1/10 >>  Dose adjustments this admission:  Microbiology results: 1/5 BCx: ngtd  Thank you for allowing pharmacy to be a part of this patient's care.  Lenis Noon, PharmD 09/18/2018 11:35 AM

## 2018-09-18 NOTE — Consult Note (Signed)
Briana Sweeney is an 48 y.o. female.   Chief Complaint: left forearm/elbow infection HPI: 48 yo rhd female states she has had swelling and erythema left forearm/hand/elbow over the last week.  Has been admitted for IVABx with improvement in hand, distal forearm, elbow but still with persistent swelling/erythema of proximal dorsal forearm.  Also fell and had wound posterior elbow which is scabbed but with surrounding erythema.  Some chills.  Throbbing pain in forearm aggravated by palpation and relieved with rest.  Increased pain at night.  Case discussed with Irine Seal, MD and his note from 09/18/2018 reviewed. Xrays viewed and interpreted by me: ultrasound left forearm (09/18/18) shows fluid collection in subcutaneous fat superficial to fascia.  Ap, lateral, oblique views left elbow show no fractures, dislocations, radioopaque foreign bodies. CT left forearm (09/15/18) shows no fractures, dislocations, radioopaque foreign bodies.  No abscess noted. Labs reviewed: WBC 2.8.  Chronically low.  Allergies: No Known Allergies  Past Medical History:  Diagnosis Date  . Anxiety   . Bipolar affective disorder ([place])    With anxiety features  . 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 Depression   . ETOHism ([place])   . GERD (gastroesophageal reflux disease)   . Hematemesis 02/10/2018  . Hepatitis C 2018   hepatitis c and alcohol related hepatitis  . Heroin abuse ([place])   . History of blood transfusion    "blood doesn't clot; I fell down and had to have a transfusion"  . History of kidney stones   . Migraine    "when I get really stressed" (09/01/2017)  . Schizophrenia ([place])   . Seizures ([place])    "when I run out of my RX; lots recently" (09/01/2017)    Past Surgical History:  Procedure Laterality Date  . ESOPHAGOGASTRODUODENOSCOPY N/A 09/03/2017   Procedure: ESOPHAGOGASTRODUODENOSCOPY (EGD);  Surgeon: Doran Stabler, MD;  Location: [facility];  Service:  Gastroenterology;  Laterality: N/A;  . FINGER FRACTURE SURGERY Left    "shattered my pinky"  . FRACTURE SURGERY    . IR PARACENTESIS  07/23/2017  . IR PARACENTESIS  07/2017   "did it twice in the same week" (09/01/2017)  . SHOULDER OPEN ROTATOR CUFF REPAIR Right   . TUBAL LIGATION    . VAGINAL HYSTERECTOMY      Family History: Family History  Problem Relation Age of Onset  . Lung cancer Mother 59  . Alcohol abuse Mother   . Throat cancer Father 56    Social History:   reports that she has never smoked. She has never used smokeless tobacco. She reports current alcohol use of about 63.0 standard drinks of alcohol per week. She reports current drug use. Drug: Marijuana.  Medications: Medications Prior to Admission  Medication Sig Dispense Refill  . busPIRone (BUSPAR) 10 MG tablet Take 1 tablet (10 mg total) by mouth 2 (two) times daily. 30 tablet 2  . FLUoxetine (PROZAC) 20 MG capsule Take 1 capsule (20 mg total) by mouth daily. 30 capsule 0  . folic acid (FOLVITE) 1 MG tablet Take 1 tablet (1 mg total) by mouth daily. 30 tablet 0  . furosemide (LASIX) 20 MG tablet Take 1 tablet (20 mg total) by mouth daily. 30 tablet 2  . gabapentin (NEURONTIN) 100 MG capsule Take 2 capsules (200 mg total) by mouth 3 (three) times daily. 180 capsule 3  . Multiple Vitamin (MULTIVITAMIN WITH MINERALS) TABS tablet Take 1 tablet by mouth daily.    [name] Kitchen  phenytoin (DILANTIN) 300 MG ER capsule Take 300 mg by mouth daily.    [name] Kitchen spironolactone (ALDACTONE) 50 MG tablet Take 1 tablet (50 mg total) by mouth daily. 30 tablet 2  . thiamine 100 MG tablet Take 1 tablet (100 mg total) by mouth daily. 30 tablet 0  . topiramate (TOPAMAX) 50 MG tablet Take 1 tablet (50 mg total) by mouth daily. 30 tablet 2  . traZODone (DESYREL) 50 MG tablet Take 1 tablet (50 mg total) by mouth at bedtime as needed for sleep. 30 tablet 0  . haloperidol (HALDOL) 5 MG tablet Take 1 tablet (5 mg total) by mouth 2 (two) times daily. (Patient  not taking: Reported on 09/13/2018) 60 tablet 0  . lactulose (CHRONULAC) 10 GM/15ML solution Take 30 mLs (20 g total) by mouth daily. (Patient not taking: Reported on 08/22/2018) 240 mL 3    Results for orders placed or performed during the hospital encounter of 09/13/18 (from the past 48 hour(s))  Basic metabolic panel     Status: Abnormal   Collection Time: 09/17/18  8:05 AM  Result Value Ref Range   Sodium 133 (L) 135 - 145 mmol/L   Potassium 3.8 3.5 - 5.1 mmol/L   Chloride 106 98 - 111 mmol/L   CO2 20 (L) 22 - 32 mmol/L   Glucose, Bld 133 (H) 70 - 99 mg/dL   BUN 5 (L) 6 - 20 mg/dL   Creatinine, Ser 0.67 0.44 - 1.00 mg/dL   Calcium 8.4 (L) 8.9 - 10.3 mg/dL   GFR calc non Af Amer >60 >60 mL/min   GFR calc Af Amer >60 >60 mL/min   Anion gap 7 5 - 15    Comment: Performed at [facility], [place] [address] [place], [place] [address]  CBC with Differential/Platelet     Status: Abnormal   Collection Time: 09/17/18  8:05 AM  Result Value Ref Range   WBC 2.8 (L) 4.0 - 10.5 K/uL   RBC 4.47 3.87 - 5.11 MIL/uL   Hemoglobin 12.7 12.0 - 15.0 g/dL   HCT 40.2 36.0 - 46.0 %   MCV 89.9 80.0 - 100.0 fL   MCH 28.4 26.0 - 34.0 pg   MCHC 31.6 30.0 - 36.0 g/dL   RDW 19.9 (H) 11.5 - 15.5 %   Platelets 52 (L) 150 - 400 K/uL    Comment: Immature Platelet Fraction may be clinically indicated, consider ordering this additional test [id number] CONSISTENT WITH PREVIOUS RESULT    nRBC 0.0 0.0 - 0.2 %   Neutrophils Relative % 51 %   Neutro Abs 1.4 (L) 1.7 - 7.7 K/uL   Lymphocytes Relative 33 %   Lymphs Abs 0.9 0.7 - 4.0 K/uL   Monocytes Relative 9 %   Monocytes Absolute 0.3 0.1 - 1.0 K/uL   Eosinophils Relative 6 %   Eosinophils Absolute 0.2 0.0 - 0.5 K/uL   Basophils Relative 1 %   Basophils Absolute 0.0 0.0 - 0.1 K/uL   Immature Granulocytes 0 %   Abs Immature Granulocytes 0.01 0.00 - 0.07 K/uL    Comment: Performed at [facility], [place] [address],  [place], [place] [address]  Magnesium     Status: None   Collection Time: 09/17/18  8:05 AM  Result Value Ref Range   Magnesium 1.8 1.7 - 2.4 mg/dL    Comment: Performed at [facility], [place] [address] [place], [place] [address]  Phenytoin level, total     Status:  Abnormal   Collection Time: 09/17/18  8:05 AM  Result Value Ref Range   Phenytoin Lvl 5.2 (L) 10.0 - 20.0 ug/mL    Comment: Performed at [facility], [place] [address], [place], [place] [address]  Basic metabolic panel     Status: Abnormal   Collection Time: 09/18/18  7:14 AM  Result Value Ref Range   Sodium 136 135 - 145 mmol/L   Potassium 3.9 3.5 - 5.1 mmol/L   Chloride 104 98 - 111 mmol/L   CO2 23 22 - 32 mmol/L   Glucose, Bld 92 70 - 99 mg/dL   BUN 8 6 - 20 mg/dL   Creatinine, Ser 0.63 0.44 - 1.00 mg/dL   Calcium 8.4 (L) 8.9 - 10.3 mg/dL   GFR calc non Af Amer >60 >60 mL/min   GFR calc Af Amer >60 >60 mL/min   Anion gap 9 5 - 15    Comment: Performed at [facility], [place] [address], [place], [place] [address]  CBC with Differential/Platelet     Status: Abnormal   Collection Time: 09/18/18  7:14 AM  Result Value Ref Range   WBC 2.9 (L) 4.0 - 10.5 K/uL   RBC 4.50 3.87 - 5.11 MIL/uL   Hemoglobin 12.7 12.0 - 15.0 g/dL   HCT 40.0 36.0 - 46.0 %   MCV 88.9 80.0 - 100.0 fL   MCH 28.2 26.0 - 34.0 pg   MCHC 31.8 30.0 - 36.0 g/dL   RDW 19.7 (H) 11.5 - 15.5 %   Platelets 53 (L) 150 - 400 K/uL    Comment: Immature Platelet Fraction may be clinically indicated, consider ordering this additional test [id number] CONSISTENT WITH PREVIOUS RESULT    nRBC 0.0 0.0 - 0.2 %   Neutrophils Relative % 36 %   Neutro Abs 1.1 (L) 1.7 - 7.7 K/uL   Lymphocytes Relative 43 %   Lymphs Abs 1.3 0.7 - 4.0 K/uL   Monocytes Relative 15 %   Monocytes Absolute 0.4 0.1 - 1.0 K/uL   Eosinophils Relative 5 %   Eosinophils Absolute 0.2 0.0 - 0.5 K/uL   Basophils Relative 1 %   Basophils Absolute  0.0 0.0 - 0.1 K/uL   Immature Granulocytes 0 %   Abs Immature Granulocytes 0.01 0.00 - 0.07 K/uL    Comment: Performed at [facility], [place] [address], [place], [place] [address]  Magnesium     Status: None   Collection Time: 09/18/18  7:14 AM  Result Value Ref Range   Magnesium 2.0 1.7 - 2.4 mg/dL    Comment: Performed at [facility], [place] [address], [place],  [address]    [place] Lt Upper Extrem Ltd Soft Tissue Non Vascular  Result Date: 09/18/2018 CLINICAL DATA:  Left forearm redness, pain and swelling. EXAM: ULTRASOUND LEFT UPPER EXTREMITY LIMITED TECHNIQUE: Ultrasound examination of the upper extremity soft tissues was performed in the area of clinical concern. COMPARISON:  CT scan dated 09/15/2018 FINDINGS: There is a small amount of fluid deep to the subcutaneous fat and adjacent to the superficial fascia of the muscle without a definable abscess. No other abnormality. This fluid collection measures 13 x 38 x 4 mm. IMPRESSION: Poorly defined subcutaneous fluid collection in the left forearm near the elbow. No defined wall. This could represent an early abscess or sterile fluid. Electronically Signed   By: Lorriane Shire M.D.   On: 09/18/2018 14:03     A comprehensive review of  systems was negative except for: Constitutional: positive for chills Review of Systems: No chest pain, shortness of breath, nausea, vomiting, diarrhea, constipation, easy bleeding or bruising, headaches, dizziness, vision changes, fainting.   Blood pressure 102/68, pulse 74, temperature 97.9 F (36.6 C), temperature source Oral, resp. rate 18, height 5\' 2"  (1.575 m), weight 59.5 kg, SpO2 97 %.  General appearance: alert, cooperative and appears stated age Head: Normocephalic, without obvious abnormality, atraumatic Neck: supple, symmetrical, trachea midline Extremities: Intact sensation and capillary refill all digits.  +epl/fpl/io.  No wounds. Compartments soft.   Induration and swelling left proximal forearm dorsally.  No proximal streaking.  Tender to palpation.  At olecranon has scabbed wound with surrounding erythema.  Fluctuance felt.  Tender to palpation. Pulses: 2+ and symmetric Skin: Skin color, texture, turgor normal. No rashes or lesions Neurologic: Grossly normal Incision/Wound: as above  Assessment/Plan Left olecranon septic bursitis and proximal forearm infection.  Discussed options including continued IV ABx over next day and reevaluation vs operative incision and drainage.  She prefers incision and drainage.  Given fluctuant olecranon bursa and erythema, will proceed with incision and drainage.  Would also recommend incision and drainage of proximal forearm given continued erythema, induration despite IV ABx and ultrasound finding of fluid collection.  Risks, benefits and alternatives of surgery were discussed including risks of blood loss, infection, damage to nerves/vessels/tendons/ligament/bone, failure of surgery, need for additional surgery, complication with wound healing,continued infection with need for repeat incision and drainage.  She voiced understanding of these risks and elected to proceed.    Leanora Cover 09/18/2018, 3:15 PM

## 2018-09-18 NOTE — Progress Notes (Signed)
Incision and drainage of left olecranon bursa and proximal forearm.  Thin watery fluid encountered.  No gross purulence.  Cultures were taken.  Wound is packed with quarter inch iodoform gauze.  Start hydrotherapy in 3 to 4 days.  Okay to do hydrotherapy as outpatient if medically stable.

## 2018-09-18 NOTE — Anesthesia Preprocedure Evaluation (Addendum)
Anesthesia Evaluation  Patient identified by MRN, date of birth, ID band Patient awake    Reviewed: Allergy & Precautions, NPO status , Patient's Chart, lab work & pertinent test results  Airway Mallampati: II  TM Distance: >3 FB Neck ROM: Full    Dental no notable dental hx. (+) Teeth Intact   Pulmonary neg pulmonary ROS,    Pulmonary exam normal breath sounds clear to auscultation       Cardiovascular negative cardio ROS Normal cardiovascular exam Rhythm:Regular Rate:Normal     Neuro/Psych  Headaches, Seizures -,  Bipolar Disorder Schizophrenia    GI/Hepatic GERD  ,(+) Cirrhosis     substance abuse  ,   Endo/Other    Renal/GU      Musculoskeletal   Abdominal   Peds negative pediatric ROS (+)  Hematology   Anesthesia Other Findings   Reproductive/Obstetrics                            Lab Results  Component Value Date   WBC 2.9 (L) 09/18/2018   HGB 12.7 09/18/2018   HCT 40.0 09/18/2018   MCV 88.9 09/18/2018   PLT 53 (L) 09/18/2018   Lab Results  Component Value Date   WBC 2.9 (L) 09/18/2018   HGB 12.7 09/18/2018   HCT 40.0 09/18/2018   MCV 88.9 09/18/2018   PLT 53 (L) 09/18/2018     Anesthesia Physical Anesthesia Plan  ASA: III  Anesthesia Plan: General   Post-op Pain Management:    Induction: Intravenous  PONV Risk Score and Plan: 3 and Treatment may vary due to age or medical condition, Ondansetron and Dexamethasone  Airway Management Planned: LMA  Additional Equipment:   Intra-op Plan:   Post-operative Plan:   Informed Consent: I have reviewed the patients History and Physical, chart, labs and discussed the procedure including the risks, benefits and alternatives for the proposed anesthesia with the patient or authorized representative who has indicated his/her understanding and acceptance.   Dental advisory given  Plan Discussed with: CRNA  Anesthesia  Plan Comments:       Anesthesia Quick Evaluation

## 2018-09-18 NOTE — Anesthesia Procedure Notes (Signed)
Procedure Name: LMA Insertion Date/Time: 09/18/2018 5:55 PM Performed by: Niel Hummer, CRNA Pre-anesthesia Checklist: Patient identified, Emergency Drugs available, Suction available and Patient being monitored Patient Re-evaluated:Patient Re-evaluated prior to induction Oxygen Delivery Method: Circle system utilized Preoxygenation: Pre-oxygenation with 100% oxygen Induction Type: IV induction Ventilation: Mask ventilation without difficulty LMA: LMA inserted and LMA with gastric port inserted LMA Size: 4.0 Dental Injury: Teeth and Oropharynx as per pre-operative assessment

## 2018-09-18 NOTE — Anesthesia Procedure Notes (Signed)
Date/Time: 09/18/2018 6:31 PM Performed by: Cynda Familia, CRNA Oxygen Delivery Method: Simple face mask Placement Confirmation: positive ETCO2 and breath sounds checked- equal and bilateral Dental Injury: Teeth and Oropharynx as per pre-operative assessment

## 2018-09-18 NOTE — Consult Note (Signed)
Consult visit made by Ortho/Hand for patient.  Left arm swollen , red & warm to touch.  A small scab on elbow from a previous scrap.  Pain at elbow & forearm is an 8.  Patient states that she received an injection of Dilantin in Dorsal Left Hand on Friday.  After a few hours is when her forearm started swelling & hurting.  Patient states that it was twice the size it should be.  She then came in Hospital on Sunday.  She states the swelling has decreased but redness & pain remains.  Pictures sent to Dr Richardo Priest & he is to see patient today.

## 2018-09-18 NOTE — Progress Notes (Signed)
PROGRESS NOTE    Briana Sweeney  ZSW:[id number] DOB: 06-22-1971 DOA: 09/13/2018 PCP: [name], FNP    Brief Narrative:  48 year old with past medical history significant for alcoholic cirrhosis, depression, schizophrenia, prior alcohol withdrawals, history of seizure who presents complaining of left hand and forearm swelling after recent ED encounter on 09/11/2018.  Patient presented with a syncope episode on 09/11/2017 was reportedly intoxicated.  She received IV Dilantin load in the ED due to prior history of seizures through the IV inserted in the left Hein.  She reported feeling burning sensation during Dilantin administration.  Following day she wake up with redness swelling in the left hand and into the forearm.  Patient was admitted with left forearm and hand cellulitis.  Was a started on IV Ancef subsequently added vancomycin.  White blood cell has been coming down.  Edema and redness has slightly improved today.  She was not able to tolerate MRI we proceeded with CT today.    Assessment & Plan:   Principal Problem:   Cellulitis of left forearm Active Problems:   Hepatitis C antibody positive in blood   Alcoholic cirrhosis of liver with ascites (HCC)   Esophageal varices without bleeding (HCC)   Alcohol abuse with alcohol-induced mood disorder (HCC)   Alcohol dependence (HCC)   GERD (gastroesophageal reflux disease)   Chronic anemia   Erysipelas  1-Left hand , forearm Cellulitis; with edema, redness;  Questionable etiology.  Patient prior to admission had left upper extremity Dopplers done which were negative for DVT.  Patient still with some erythema, warmth, tenderness to palpation and edema.  Slight improvement with erythema however patient with an area of induration just below the elbow which is very tender to palpation.  Concern for possible abscess formation.  Patient is on IV Ancef.  Patient was on IV vancomycin for MRSA coverage and was discontinued and transition to  oral doxycycline on 09/17/2018.  Due to concern for induration/abscess will discontinue doxycycline and place patient back on IV vancomycin.  Continue IV Ancef.  Consult with hand surgery for further evaluation and management for possible incision and drainage. Follow.   2-History of alcoholic cirrhosis. Alcohol withdrawal;  Patient with tremors noted.  Concern for alcohol withdrawal.  Discontinued the Ativan CIWA protocol.  Some improvement with tremors.  Continue Librium withdrawal protocol.  Continue home regimen Lasix and spironolactone.  Resumed home dose lactulose.  Follow.   3-History of seizure; depression, schizophrenia.  Patient with no seizures noted.  Currently stable.  Continue home regimen of Dilantin, BuSpar, trazodone, gabapentin, Topamax.  Outpatient follow-up.   4-Chronic thrombocytopenia;  Secondary to cirrhosis.  Patient with no overt bleeding.  Platelet count slowly trending up currently at 53.  Follow.   5.Homelessness / financial barriers  - SW consult for medication access (ie PO abx) and disposition planning.  6. Hypokalemia;  potassium repleted.  Magnesium at 2.  Follow.   7.  Gastroesophageal reflux disease Continue Pepcid  8.  Neutropenia Likely secondary to cirrhosis.     DVT prophylaxis: SCDs Code Status: Full Family Communication: Updated patient.  No family at bedside. Disposition Plan: Likely back to homeless shelter when clinically improved.   Consultants:   Hand surgery pending  Procedures:   CT left forearm 09/15/2018  CT left elbow 09/15/2018  Antimicrobials:   IV vancomycin 09/14/2018>>>>09/17/2018  IV Ancef 09/14/2018  Doxycycline 09/17/2018   Subjective: Patient laying in bed.  States has significant pain just below the left elbow.  Some tremors however  improving.  No chest pain.  No shortness of breath.  Denies any bleeding.   Objective: Vitals:   09/17/18 1442 09/17/18 2040 09/18/18 0055 09/18/18 0443  BP: 111/82 (!) 130/98  119/82 102/68  Pulse: 75 88 95 74  Resp: 20 18 18 18   Temp: 97.7 F (36.5 C) 98.8 F (37.1 C) 98.3 F (36.8 C) 97.9 F (36.6 C)  TempSrc: Oral Oral Oral Oral  SpO2: 96% 94% 97% 97%  Weight:      Height:        Intake/Output Summary (Last 24 hours) at 09/18/2018 1013 Last data filed at 09/18/2018 0940 Gross per 24 hour  Intake 800 ml  Output -  Net 800 ml   Filed Weights   09/14/18 2045  Weight: 59.5 kg    Examination:  General exam: Less Tremors. Respiratory system: CTAB.  No wheezes, no crackles, no rhonchi.  Normal respiratory effort.  Cardiovascular system: RRR no murmurs rubs or gallops.  No JVD.  No lower extremity edema.  Gastrointestinal system: Abdomen is nontender, nondistended, soft, positive bowel sounds.  No rebound.  No guarding.  Central nervous system: Alert and oriented. No focal neurological deficits. Extremities: Left upper extremity with decreasing erythema, decreasing edema, left elbow with some area of induration and just below the left elbow significant area of induration, warmth, significant tenderness to palpation. Skin: No rashes, lesions or ulcers Psychiatry: Judgement and insight appear normal. Mood & affect appropriate.     Data Reviewed: I have personally reviewed following labs and imaging studies  CBC: Recent Labs  Lab 09/14/18 0606 09/15/18 0611 09/16/18 0559 09/17/18 0805 09/18/18 0714  WBC 7.7 5.4 3.8* 2.8* 2.9*  NEUTROABS 5.3 3.5 1.9 1.4* 1.1*  HGB 11.5* 11.4* 12.5 12.7 12.7  HCT 36.1 35.9* 38.8 40.2 40.0  MCV 88.9 89.1 89.2 89.9 88.9  PLT 29* 36* 46* 52* 53*   Basic Metabolic Panel: Recent Labs  Lab 09/14/18 0606 09/15/18 0611 09/16/18 0559 09/17/18 0805 09/18/18 0714  NA 136 136 137 133* 136  K 3.3* 3.8 3.7 3.8 3.9  CL 109 109 111 106 104  CO2 20* 20* 19* 20* 23  GLUCOSE 106* 97 99 133* 92  BUN 7 7 5* 5* 8  CREATININE 0.64 0.74 0.59 0.67 0.63  CALCIUM 7.8* 8.3* 8.4* 8.4* 8.4*  MG 1.8 1.7 1.8 1.8 2.0    GFR: Estimated Creatinine Clearance: 69.5 mL/min (by C-G formula based on SCr of 0.63 mg/dL). Liver Function Tests: Recent Labs  Lab 09/11/18 2020 09/13/18 1705  AST 47* 31  ALT 34 27  ALKPHOS 106 89  BILITOT 0.6 2.0*  PROT 7.8 7.6  ALBUMIN 3.2* 3.1*   No results for input(s): LIPASE, AMYLASE in the last 168 hours. No results for input(s): AMMONIA in the last 168 hours. Coagulation Profile: No results for input(s): INR, PROTIME in the last 168 hours. Cardiac Enzymes: Recent Labs  Lab 09/13/18 1705  CKTOTAL 49   BNP (last 3 results) No results for input(s): PROBNP in the last 8760 hours. HbA1C: No results for input(s): HGBA1C in the last 72 hours. CBG: No results for input(s): GLUCAP in the last 168 hours. Lipid Profile: No results for input(s): CHOL, HDL, LDLCALC, TRIG, CHOLHDL, LDLDIRECT in the last 72 hours. Thyroid Function Tests: No results for input(s): TSH, T4TOTAL, FREET4, T3FREE, THYROIDAB in the last 72 hours. Anemia Panel: No results for input(s): VITAMINB12, FOLATE, FERRITIN, TIBC, IRON, RETICCTPCT in the last 72 hours. Sepsis Labs: Recent Labs  Lab 09/13/18  1720  LATICACIDVEN 1.79    Recent Results (from the past 240 hour(s))  Blood culture (routine x 2)     Status: None (Preliminary result)   Collection Time: 09/13/18  6:24 PM  Result Value Ref Range Status   Specimen Description   Final    BLOOD RIGHT ARM Performed at [facility] [address] [place], [place] [address]    Special Requests   Final    BOTTLES DRAWN AEROBIC AND ANAEROBIC Blood Culture adequate volume Performed at [place] [address], [place], [place] [address]    Culture   Final    NO GROWTH 4 DAYS Performed at [facility], [place] [address] [place], [place] [address]    Report Status PENDING  Incomplete  Blood culture (routine x 2)     Status: None (Preliminary result)   Collection Time: 09/13/18  6:29 PM  Result  Value Ref Range Status   Specimen Description   Final    BLOOD RIGHT ANTECUBITAL Performed at [facility] [place] [address], [place], [place] [address]    Special Requests   Final    BOTTLES DRAWN AEROBIC AND ANAEROBIC Blood Culture adequate volume Performed at [facility] [address] [place], [place] [address]    Culture   Final    NO GROWTH 4 DAYS Performed at [facility], [place] [address], [place], [place] [address]    Report Status PENDING  Incomplete         Radiology Studies: No results found.      Scheduled Meds: . busPIRone  10 mg Oral BID  . chlordiazePOXIDE  25 mg Oral TID   Followed by  . [START ON 09/19/2018] chlordiazePOXIDE  25 mg Oral [id number]   Followed by  . [START ON 09/20/2018] chlordiazePOXIDE  25 mg Oral Daily  . doxycycline  100 mg Oral Q12H  . famotidine  20 mg Oral Daily  . FLUoxetine  20 mg Oral Daily  . folic acid  1 mg Oral Daily  . furosemide  20 mg Oral Daily  . gabapentin  200 mg Oral TID  . lactulose  20 g Oral Daily  . multivitamin with minerals  1 tablet Oral Daily  . phenytoin  300 mg Oral Daily  . spironolactone  50 mg Oral Daily  . thiamine  100 mg Oral Daily  . topiramate  50 mg Oral Daily   Continuous Infusions: .  ceFAZolin (ANCEF) IV 2 g (09/18/18 0854)     LOS: 3 days    Time spent: 35 minutes    Irine Seal, MD [facility]  If 7PM-7AM, please contact night-coverage [email or web address] 09/18/2018, 10:13 AM

## 2018-09-18 NOTE — Op Note (Signed)
NAME: Briana Sweeney MEDICAL RECORD NO: [id number] DATE OF BIRTH: [birth year] FACILITY: [facility] LOCATION: [facility] PHYSICIAN: Tennis Must, MD   OPERATIVE REPORT   DATE OF PROCEDURE: 09/18/18    PREOPERATIVE DIAGNOSIS:   Left septic olecranon bursitis and left proximal forearm infection   POSTOPERATIVE DIAGNOSIS:   Left septic olecranon bursitis and proximal forearm infection   PROCEDURE:   1.  Incision and drainage left olecranon bursa 2.  Incision and drainage of left proximal forearm infection   SURGEON:  Leanora Cover, M.D.   ASSISTANT: none   ANESTHESIA:  General   INTRAVENOUS FLUIDS:  Per anesthesia flow sheet.   ESTIMATED BLOOD LOSS:  Minimal.   COMPLICATIONS:  None.   SPECIMENS:   Cultures from olecranon bursa to micro   TOURNIQUET TIME:    Total Tourniquet Time Documented: Upper Arm (Left) - 17 minutes Total: Upper Arm (Left) - 17 minutes    DISPOSITION:  Stable to PACU.   INDICATIONS: 48 year old female has been admitted for IV antibiotics for left forearm and hand infection.  Much of this has improved though she continues to have an indurated swollen erythematous area on the proximal forearm as well as a fluctuant olecranon bursa with surrounding erythema around a scabbed wound.  We discussed continued antibiotics versus operative incision and drainage and she wished to proceed with incision and drainage. Risks, benefits and alternatives of surgery were discussed including the risks of blood loss, infection, damage to nerves, vessels, tendons, ligaments, bone for surgery, need for additional surgery, complications with wound healing, continued pain, need for repeat irrigation and debridement, stiffness.  She voiced understanding of these risks and elected to proceed.  OPERATIVE COURSE:  After being identified preoperatively by myself,  the patient and I agreed on the procedure and site of the procedure.  The surgical site was marked.  Surgical consent had been  signed.  She is on scheduled antibiotics. She was transferred to the operating room and placed on the operating table in supine position with the Left upper extremity on an arm board.  General anesthesia was induced by the anesthesiologist.  Left upper extremity was prepped and draped in normal sterile orthopedic fashion.  A surgical pause was performed between the surgeons, anesthesia, and operating room staff and all were in agreement as to the patient, procedure, and site of procedure.  Tourniquet at the proximal aspect of the extremity was inflated to 250 mmHg after exsanguination of the arm with an Esmarch bandage.    Incision was made on the dorsum of the elbow connecting with the scabbed wound.  There was copious watery blood-tinged fluid.  Cultures were taken for aerobes and anaerobes.  No gross purulence was encountered.  The wound was probed.  Had a good edge to it.  An incision was then made in the proximal radial forearm over the area of induration.  This is carried in subcutaneous tissues by spreading technique.  Electrocautery was used to obtain hemostasis.  There was an indurated fat.  There was some thin watery fluid within the subcutaneous tissues and at the fascia.  There was a small rent in the fascia and this was spread.  There is no gross purulence or fluid deep to the fascia.  This wound seem to communicate to the olecranon bursa at its proximal aspect.  The wounds were both copiously irrigated with sterile saline by bulb syringe.  There were then packed with quarter inch iodoform gauze and injected with quarter percent plain Marcaine  to aid in postoperative analgesia.  There were dressed with sterile 4 x 4's and ABD and wrapped with Kerlix and Ace bandage.  The tourniquet was deflated at 17 minutes.  Fingertips were pink with brisk capillary refill after deflation of tourniquet.  The operative  drapes were broken down.  The patient was awoken from anesthesia safely.  She was transferred back  to the stretcher and taken to PACU in stable condition.  She is currently admitted to the hospitalist and will continue IV antibiotics.  Will start hydrotherapy and 3 to 4 days.  If she is medically stable she is okay for discharge and we can perform hydrotherapy in the office as an outpatient.  Leanora Cover, MD Electronically signed, 09/18/18

## 2018-09-19 DIAGNOSIS — M7022 Olecranon bursitis, left elbow: Secondary | ICD-10-CM

## 2018-09-19 LAB — CBC WITH DIFFERENTIAL/PLATELET
Abs Immature Granulocytes: 0.01 10*3/uL (ref 0.00–0.07)
Basophils Absolute: 0 10*3/uL (ref 0.0–0.1)
Basophils Relative: 1 %
Eosinophils Absolute: 0 10*3/uL (ref 0.0–0.5)
Eosinophils Relative: 0 %
HCT: 40.3 % (ref 36.0–46.0)
Hemoglobin: 12.7 g/dL (ref 12.0–15.0)
Immature Granulocytes: 0 %
Lymphocytes Relative: 18 %
Lymphs Abs: 0.7 10*3/uL (ref 0.7–4.0)
MCH: 28.9 pg (ref 26.0–34.0)
MCHC: 31.5 g/dL (ref 30.0–36.0)
MCV: 91.6 fL (ref 80.0–100.0)
Monocytes Absolute: 0.4 10*3/uL (ref 0.1–1.0)
Monocytes Relative: 10 %
Neutro Abs: 2.8 10*3/uL (ref 1.7–7.7)
Neutrophils Relative %: 71 %
Platelets: 57 10*3/uL — ABNORMAL LOW (ref 150–400)
RBC: 4.4 MIL/uL (ref 3.87–5.11)
RDW: 19.2 % — ABNORMAL HIGH (ref 11.5–15.5)
WBC: 3.9 10*3/uL — ABNORMAL LOW (ref 4.0–10.5)
nRBC: 0 % (ref 0.0–0.2)

## 2018-09-19 LAB — BASIC METABOLIC PANEL
Anion gap: 8 (ref 5–15)
BUN: 7 mg/dL (ref 6–20)
CO2: 24 mmol/L (ref 22–32)
Calcium: 8.8 mg/dL — ABNORMAL LOW (ref 8.9–10.3)
Chloride: 102 mmol/L (ref 98–111)
Creatinine, Ser: 0.61 mg/dL (ref 0.44–1.00)
GFR calc Af Amer: >60 mL/min (ref >60)
GFR calc non Af Amer: >60 mL/min (ref >60)
Glucose, Bld: 102 mg/dL — ABNORMAL HIGH (ref 70–99)
Potassium: 3.9 mmol/L (ref 3.5–5.1)
Sodium: 134 mmol/L — ABNORMAL LOW (ref 135–145)

## 2018-09-19 LAB — CULTURE, BLOOD (ROUTINE X 2)
Culture: NO GROWTH
Culture: NO GROWTH
Special Requests: ADEQUATE
Special Requests: ADEQUATE

## 2018-09-19 MED ORDER — HYDROMORPHONE HCL 1 MG/ML IJ SOLN
0.5000 mg | INTRAMUSCULAR | Status: DC | PRN
  Administered 2018-09-19 – 2018-09-20 (×4): 1 mg via INTRAVENOUS
  Filled 2018-09-19 (×4): qty 1

## 2018-09-19 NOTE — Progress Notes (Signed)
PROGRESS NOTE    Briana Sweeney  ZOX:[id number] DOB: 1971-05-11 DOA: 09/13/2018 PCP: [name], FNP    Brief Narrative:  48 year old with past medical history significant for alcoholic cirrhosis, depression, schizophrenia, prior alcohol withdrawals, history of seizure who presents complaining of left hand and forearm swelling after recent ED encounter on 09/11/2018.  Patient presented with a syncope episode on 09/11/2017 was reportedly intoxicated.  She received IV Dilantin load in the ED due to prior history of seizures through the IV inserted in the left Hein.  She reported feeling burning sensation during Dilantin administration.  Following day she wake up with redness swelling in the left hand and into the forearm.  Patient was admitted with left forearm and hand cellulitis.  Was a started on IV Ancef subsequently added vancomycin.  White blood cell has been coming down.  Edema and redness has slightly improved today.  She was not able to tolerate MRI we proceeded with CT today.    Assessment & Plan:   Principal Problem:   Cellulitis of left forearm Active Problems:   Hepatitis C antibody positive in blood   Alcoholic cirrhosis of liver with ascites (HCC)   Esophageal varices without bleeding (HCC)   Alcohol abuse with alcohol-induced mood disorder (HCC)   Alcohol dependence (HCC)   GERD (gastroesophageal reflux disease)   Chronic anemia   Erysipelas  1-Left hand , forearm Cellulitis; with edema, redness;/left septic olecranon bursitis and left proximal forearm infection Questionable etiology.  Patient prior to admission had left upper extremity Dopplers done which were negative for DVT.  Patient still with some erythema, warmth, tenderness to palpation and edema.  Slight improvement with erythema however patient with an area of induration just below the elbow which was very tender to palpation.  Concern for possible abscess formation.  Patient is on IV Ancef.  Patient was on IV  vancomycin for MRSA coverage and was discontinued and transition to oral doxycycline on 09/17/2018.  Due to concern for induration/abscess IV vancomycin was resumed and doxy discontinued.  Patient was seen in consultation by hand surgery who felt patient likely had a left septic olecranon bursitis and left proximal forearm infection.  Patient status post incision and drainage with cultures pending.  Cultures with no growth to date.  Continue empiric IV vancomycin and IV Ancef for now.  Per hand surgeon wound has been packed with quarter inch iodoform gauze and recommending to start hydrotherapy in 3 to 4 days.  And surgery following and appreciate their input and recommendations.   2-History of alcoholic cirrhosis. Alcohol withdrawal;  Patient with tremors noted.  Concern for alcohol withdrawal.  Discontinued the Ativan CIWA protocol.  Some improvement with tremors.  Continue Librium withdrawal protocol.  Continue home regimen Lasix and spironolactone and lactulose. Follow.   3-History of seizure; depression, schizophrenia.  Patient with no seizures noted.  Currently stable.  Continue home regimen of Dilantin, BuSpar, trazodone, gabapentin, Topamax.  Outpatient follow-up.   4-Chronic thrombocytopenia;  Secondary to cirrhosis.  Patient with no overt bleeding.  Platelet count slowly trending up currently at 57.  Follow.   5.Homelessness / financial barriers  - SW consult for medication access (ie PO abx) and disposition planning.  6. Hypokalemia;   Repleted.  Follow.   7.  Gastroesophageal reflux disease Continue Pepcid  8.  Neutropenia Likely secondary to cirrhosis.  Follow-up     DVT prophylaxis: SCDs Code Status: Full Family Communication: Updated patient.  No family at bedside. Disposition Plan: Likely back  to homeless shelter when clinically improved.   Consultants:   Hand surgery: Dr. Fredna Dow 09/18/2018  Procedures:   CT left forearm 09/15/2018  CT left elbow  09/15/2018  Incision and drainage of left olecranon bursa, left proximal forearm infection per Dr. Fredna Dow 09/18/2018  Antimicrobials:   IV vancomycin 09/14/2018>>>>09/17/2018  IV Ancef 09/14/2018  Doxycycline 09/17/2018>>> 09/18/2018  IV vancomycin 09/18/2018   Subjective: Patient in bed.  States not doing too well today.  States left hand is swollen and very painful and feels pain medication not taking away her pain.  Still with tremors however improving.  No chest pain.  No shortness of breath.  No bleeding.   Objective: Vitals:   09/18/18 1953 09/18/18 2315 09/19/18 0352 09/19/18 1213  BP: 114/84 103/68 100/70 105/79  Pulse: (!) 58 60 77 74  Resp:  18 19   Temp: 97.8 F (36.6 C) 97.8 F (36.6 C) 97.7 F (36.5 C) 98.7 F (37.1 C)  TempSrc: Oral  Oral Oral  SpO2: 99% 94% 93% 97%  Weight:      Height:        Intake/Output Summary (Last 24 hours) at 09/19/2018 1228 Last data filed at 09/19/2018 0900 Gross per 24 hour  Intake 2129.92 ml  Output 10 ml  Net 2119.92 ml   Filed Weights   09/14/18 2045  Weight: 59.5 kg    Examination:  General exam: Less Tremors. Respiratory system: Lungs clear to auscultation bilaterally.  No wheezes, no crackles, no rhonchi.  Normal respiratory effort.   Cardiovascular system: Regular rate rhythm no murmurs rubs or gallops.  No JVD.  No lower extremity edema.  Gastrointestinal system: Abdomen is soft, nontender, nondistended, positive bowel sounds.  No rebound.  No guarding.   Central nervous system: Alert and oriented. No focal neurological deficits. Extremities: Left upper extremity with bandage dressing noted.  Some swelling in hand and fingers however able to move fingers and squeeze hands.  Pulses noted.  Skin: No rashes, lesions or ulcers Psychiatry: Judgement and insight appear normal. Mood & affect appropriate.     Data Reviewed: I have personally reviewed following labs and imaging studies  CBC: Recent Labs  Lab 09/15/18 0611  09/16/18 0559 09/17/18 0805 09/18/18 0714 09/19/18 0709  WBC 5.4 3.8* 2.8* 2.9* 3.9*  NEUTROABS 3.5 1.9 1.4* 1.1* 2.8  HGB 11.4* 12.5 12.7 12.7 12.7  HCT 35.9* 38.8 40.2 40.0 40.3  MCV 89.1 89.2 89.9 88.9 91.6  PLT 36* 46* 52* 53* 57*   Basic Metabolic Panel: Recent Labs  Lab 09/14/18 0606 09/15/18 0611 09/16/18 0559 09/17/18 0805 09/18/18 0714 09/19/18 0709  NA 136 136 137 133* 136 134*  K 3.3* 3.8 3.7 3.8 3.9 3.9  CL 109 109 111 106 104 102  CO2 20* 20* 19* 20* 23 24  GLUCOSE 106* 97 99 133* 92 102*  BUN 7 7 5* 5* 8 7  CREATININE 0.64 0.74 0.59 0.67 0.63 0.61  CALCIUM 7.8* 8.3* 8.4* 8.4* 8.4* 8.8*  MG 1.8 1.7 1.8 1.8 2.0  --    GFR: Estimated Creatinine Clearance: 69.5 mL/min (by C-G formula based on SCr of 0.61 mg/dL). Liver Function Tests: Recent Labs  Lab 09/13/18 1705  AST 31  ALT 27  ALKPHOS 89  BILITOT 2.0*  PROT 7.6  ALBUMIN 3.1*   No results for input(s): LIPASE, AMYLASE in the last 168 hours. No results for input(s): AMMONIA in the last 168 hours. Coagulation Profile: No results for input(s): INR, PROTIME in the last  168 hours. Cardiac Enzymes: Recent Labs  Lab 09/13/18 1705  CKTOTAL 49   BNP (last 3 results) No results for input(s): PROBNP in the last 8760 hours. HbA1C: No results for input(s): HGBA1C in the last 72 hours. CBG: No results for input(s): GLUCAP in the last 168 hours. Lipid Profile: No results for input(s): CHOL, HDL, LDLCALC, TRIG, CHOLHDL, LDLDIRECT in the last 72 hours. Thyroid Function Tests: No results for input(s): TSH, T4TOTAL, FREET4, T3FREE, THYROIDAB in the last 72 hours. Anemia Panel: No results for input(s): VITAMINB12, FOLATE, FERRITIN, TIBC, IRON, RETICCTPCT in the last 72 hours. Sepsis Labs: Recent Labs  Lab 09/13/18 1720  LATICACIDVEN 1.79    Recent Results (from the past 240 hour(s))  Blood culture (routine x 2)     Status: None   Collection Time: 09/13/18  6:24 PM  Result Value Ref Range Status    Specimen Description   Final    BLOOD RIGHT ARM Performed at [facility], [place] [address], [place], [place] [address]    Special Requests   Final    BOTTLES DRAWN AEROBIC AND ANAEROBIC Blood Culture adequate volume Performed at [place] [address], [place], [place] [address]    Culture   Final    NO GROWTH 5 DAYS Performed at [facility], [place] [address], [place], [place] [address]    Report Status 09/19/2018 FINAL  Final  Blood culture (routine x 2)     Status: None   Collection Time: 09/13/18  6:29 PM  Result Value Ref Range Status   Specimen Description   Final    BLOOD RIGHT ANTECUBITAL Performed at [place] [address], [place], [place] [address]    Special Requests   Final    BOTTLES DRAWN AEROBIC AND ANAEROBIC Blood Culture adequate volume Performed at [place] [address], [place], [place] [address]    Culture   Final    NO GROWTH 5 DAYS Performed at [facility], [place] [address] [place], [place] [address]    Report Status 09/19/2018 FINAL  Final  Aerobic/Anaerobic Culture (surgical/deep wound)     Status: None (Preliminary result)   Collection Time: 09/18/18  6:08 PM  Result Value Ref Range Status   Specimen Description   Final    ARM LEFT Performed at [place] [address], [place], [place] [address]    Special Requests   Final    NONE Performed at [facility], [place] [address], [place], [place] [address]    Gram Stain   Final    FEW WBC PRESENT,BOTH PMN AND MONONUCLEAR NO ORGANISMS SEEN Gram Stain Report Called to,Read Back By and Verified With: S.HUERTAS AT 2110 ON 09/18/18 BY N.Savannah Morford Performed at [facility], [place] [address], [place], [place] [address]    Culture   Final    NO GROWTH < 24 HOURS Performed at [place] [address] [place], [place] [address]    Report  Status PENDING  Incomplete         Radiology Studies: [place] Lt Upper Extrem Ltd Soft Tissue Non Vascular  Result Date: 09/18/2018 CLINICAL DATA:  Left forearm redness, pain and swelling. EXAM: ULTRASOUND LEFT UPPER EXTREMITY LIMITED TECHNIQUE: Ultrasound examination of the upper extremity soft tissues was performed in the area of clinical concern. COMPARISON:  CT scan dated 09/15/2018 FINDINGS: There is a small amount of fluid deep  to the subcutaneous fat and adjacent to the superficial fascia of the muscle without a definable abscess. No other abnormality. This fluid collection measures 13 x 38 x 4 mm. IMPRESSION: Poorly defined subcutaneous fluid collection in the left forearm near the elbow. No defined wall. This could represent an early abscess or sterile fluid. Electronically Signed   By: Lorriane Shire M.D.   On: 09/18/2018 14:03        Scheduled Meds: . busPIRone  10 mg Oral BID  . chlordiazePOXIDE  25 mg Oral BH-qamhs   Followed by  . [START ON 09/20/2018] chlordiazePOXIDE  25 mg Oral Daily  . famotidine  20 mg Oral Daily  . FLUoxetine  20 mg Oral Daily  . folic acid  1 mg Oral Daily  . furosemide  20 mg Oral Daily  . gabapentin  200 mg Oral TID  . lactulose  20 g Oral Daily  . multivitamin with minerals  1 tablet Oral Daily  . phenytoin  300 mg Oral Daily  . spironolactone  50 mg Oral Daily  . thiamine  100 mg Oral Daily  . topiramate  50 mg Oral Daily   Continuous Infusions: .  ceFAZolin (ANCEF) IV 2 g (09/19/18 1041)  . lactated ringers 75 mL/hr at 09/19/18 0035  . vancomycin 1,250 mg (09/19/18 0805)     LOS: 4 days    Time spent: 35 minutes    Irine Seal, MD [facility]  If 7PM-7AM, please contact night-coverage [email or web address] 09/19/2018, 12:28 PM

## 2018-09-19 NOTE — Anesthesia Postprocedure Evaluation (Signed)
Anesthesia Post Note  Patient: Briana Sweeney  Procedure(s) Performed: IRRIGATION AND DEBRIDEMENT EXTREMITY (Left Elbow)     Patient location during evaluation: PACU Anesthesia Type: General Level of consciousness: awake and alert Pain management: pain level controlled Vital Signs Assessment: post-procedure vital signs reviewed and stable Respiratory status: spontaneous breathing, nonlabored ventilation, respiratory function stable and patient connected to nasal cannula oxygen Cardiovascular status: blood pressure returned to baseline and stable Postop Assessment: no apparent nausea or vomiting Anesthetic complications: no    Last Vitals:  Vitals:   09/19/18 1213 09/19/18 1300  BP: 105/79 119/81  Pulse: 74 68  Resp:  20  Temp: 37.1 C 36.8 C  SpO2: 97% 99%    Last Pain:  Vitals:   09/19/18 1455  TempSrc:   PainSc: 10-Worst pain ever                 Barnet Glasgow

## 2018-09-20 ENCOUNTER — Encounter (HOSPITAL_COMMUNITY): Payer: Self-pay | Admitting: Orthopedic Surgery

## 2018-09-20 ENCOUNTER — Encounter (HOSPITAL_COMMUNITY): Payer: Self-pay | Admitting: *Deleted

## 2018-09-20 LAB — MAGNESIUM: Magnesium: 1.8 mg/dL (ref 1.7–2.4)

## 2018-09-20 LAB — CBC
HCT: 41.3 % (ref 36.0–46.0)
Hemoglobin: 12.7 g/dL (ref 12.0–15.0)
MCH: 28 pg (ref 26.0–34.0)
MCHC: 30.8 g/dL (ref 30.0–36.0)
MCV: 91 fL (ref 80.0–100.0)
Platelets: 56 10*3/uL — ABNORMAL LOW (ref 150–400)
RBC: 4.54 MIL/uL (ref 3.87–5.11)
RDW: 19.9 % — ABNORMAL HIGH (ref 11.5–15.5)
WBC: 3.6 10*3/uL — ABNORMAL LOW (ref 4.0–10.5)
nRBC: 0 % (ref 0.0–0.2)

## 2018-09-20 LAB — BASIC METABOLIC PANEL
Anion gap: 7 (ref 5–15)
BUN: 6 mg/dL (ref 6–20)
CO2: 26 mmol/L (ref 22–32)
Calcium: 8.2 mg/dL — ABNORMAL LOW (ref 8.9–10.3)
Chloride: 104 mmol/L (ref 98–111)
Creatinine, Ser: 0.46 mg/dL (ref 0.44–1.00)
GFR calc Af Amer: >60 mL/min (ref >60)
GFR calc non Af Amer: >60 mL/min (ref >60)
Glucose, Bld: 98 mg/dL (ref 70–99)
Potassium: 3.5 mmol/L (ref 3.5–5.1)
Sodium: 137 mmol/L (ref 135–145)

## 2018-09-20 MED ORDER — OXYCODONE-ACETAMINOPHEN 7.5-325 MG PO TABS
1.0000 | ORAL_TABLET | Freq: Four times a day (QID) | ORAL | Status: DC | PRN
  Administered 2018-09-20 – 2018-09-22 (×8): 1 via ORAL
  Filled 2018-09-20 (×8): qty 1

## 2018-09-20 MED ORDER — HYDROMORPHONE HCL 1 MG/ML IJ SOLN
1.0000 mg | INTRAMUSCULAR | Status: DC | PRN
  Administered 2018-09-20 (×2): 1 mg via INTRAVENOUS
  Administered 2018-09-21: 2 mg via INTRAVENOUS
  Administered 2018-09-21: 1 mg via INTRAVENOUS
  Administered 2018-09-21 – 2018-09-22 (×2): 2 mg via INTRAVENOUS
  Filled 2018-09-20: qty 2
  Filled 2018-09-20 (×2): qty 1
  Filled 2018-09-20 (×3): qty 2

## 2018-09-20 MED ORDER — SORBITOL 70 % SOLN
30.0000 mL | Freq: Once | Status: AC
  Administered 2018-09-20: 30 mL via ORAL
  Filled 2018-09-20: qty 30

## 2018-09-20 MED ORDER — POTASSIUM CHLORIDE 20 MEQ PO PACK
40.0000 meq | PACK | Freq: Once | ORAL | Status: AC
  Administered 2018-09-20: 40 meq via ORAL
  Filled 2018-09-20: qty 2

## 2018-09-20 MED ORDER — OXYCODONE HCL 5 MG PO TABS: 10.0000 mg | ORAL_TABLET | ORAL | Status: DC | PRN

## 2018-09-20 NOTE — Plan of Care (Signed)
  Problem: Activity: Goal: Risk for activity intolerance will decrease Outcome: Progressing   Problem: Pain Management: Goal: Satisfaction with pain management regimen will be met by discharge Outcome: Progressing   Problem: Nutrition: Goal: Adequate nutrition will be maintained Outcome: Progressing

## 2018-09-20 NOTE — Progress Notes (Signed)
Subjective: 2 Days Post-Op Procedure(s) (LRB): IRRIGATION AND DEBRIDEMENT EXTREMITY (Left) States she has been having pain in left arm.  Ace bandage loosened ~1 hour ago.  Mission sling just placed.  No fevers or chills.  Objective: Vital signs in last 24 hours: Temp:  [97.6 F (36.4 C)-98.2 F (36.8 C)] 97.6 F (36.4 C) (01/12 0543) Pulse Rate:  [80-88] 80 (01/12 0543) Resp:  [18-20] 19 (01/12 0543) BP: (95-106)/(60-81) 95/60 (01/12 0543) SpO2:  [92 %-95 %] 93 % (01/12 0543) Weight:  [65.3 kg] 65.3 kg (01/12 0543)  Intake/Output from previous day: 01/11 0701 - 01/12 0700 In: 1050 [P.O.:1050] Out: -  Intake/Output this shift: No intake/output data recorded.  Recent Labs    09/18/18 0714 09/19/18 0709 09/20/18 0553  HGB 12.7 12.7 12.7   Recent Labs    09/19/18 0709 09/20/18 0553  WBC 3.9* 3.6*  RBC 4.40 4.54  HCT 40.3 41.3  PLT 57* 56*   Recent Labs    09/19/18 0709 09/20/18 0553  NA 134* 137  K 3.9 3.5  CL 102 104  CO2 24 26  BUN 7 6  CREATININE 0.61 0.46  GLUCOSE 102* 98  CALCIUM 8.8* 8.2*   No results for input(s): LABPT, INR in the last 72 hours.  Intact sensation and capillary refill all digits.  Dressing taken down.  Bloody drainage olecranon wound.  Compartments soft.  Erythema as expected without proximal extension.  Packing in place.  Swelling in forearm ad hand distal to dressing with demarcation at level of dressing.   Assessment/Plan: 2 Days Post-Op Procedure(s) (LRB): IRRIGATION AND DEBRIDEMENT EXTREMITY (Left) Dressing loosened including kerlix wrap.  Continue mission sling.  Start hydrotherapy tomorrow.    Leanora Cover 09/20/2018, 1:14 PM

## 2018-09-20 NOTE — Progress Notes (Signed)
PROGRESS NOTE    Briana Sweeney  NOM:[id number] DOB: 1971-06-19 DOA: 09/13/2018 PCP: [name], FNP    Brief Narrative:  48 year old with past medical history significant for alcoholic cirrhosis, depression, schizophrenia, prior alcohol withdrawals, history of seizure who presents complaining of left hand and forearm swelling after recent ED encounter on 09/11/2018.  Patient presented with a syncope episode on 09/11/2017 was reportedly intoxicated.  She received IV Dilantin load in the ED due to prior history of seizures through the IV inserted in the left Hein.  She reported feeling burning sensation during Dilantin administration.  Following day she wake up with redness swelling in the left hand and into the forearm.  Patient was admitted with left forearm and hand cellulitis.  Was a started on IV Ancef subsequently added vancomycin.  White blood cell has been coming down.  Edema and redness has slightly improved today.  She was not able to tolerate MRI we proceeded with CT today.    Assessment & Plan:   Principal Problem:   Cellulitis of left forearm Active Problems:   Hepatitis C antibody positive in blood   Alcoholic cirrhosis of liver with ascites (HCC)   Esophageal varices without bleeding (HCC)   Alcohol abuse with alcohol-induced mood disorder (HCC)   Alcohol dependence (HCC)   GERD (gastroesophageal reflux disease)   Chronic anemia   Erysipelas  1-Left hand , forearm Cellulitis; with edema, redness;/left septic olecranon bursitis and left proximal forearm infection Questionable etiology.  Patient prior to admission had left upper extremity Dopplers done which were negative for DVT.  Patient still with some erythema, warmth, tenderness to palpation and edema.  Slight improvement with erythema however patient with an area of induration just below the elbow which was very tender to palpation.  Concern for possible abscess formation.  Patient is on IV Ancef.  Patient was on IV  vancomycin for MRSA coverage and was discontinued and transition to oral doxycycline on 09/17/2018.  Due to concern for induration/abscess IV vancomycin was resumed and doxy discontinued.  Patient was seen in consultation by hand surgery who felt patient likely had a left septic olecranon bursitis and left proximal forearm infection.  Patient status post incision and drainage with cultures pending.  Cultures with no growth to date.  Continue empiric IV vancomycin and IV Ancef for now.  Per hand surgeon wound has been packed with quarter inch iodoform gauze and recommending to start hydrotherapy tomorrow.  Will loosen Ace bandage as patient complained of significant pain in the left upper extremity with some swelling.  Place admission sling. Hand surgery following and appreciate input and recommendations.   2-History of alcoholic cirrhosis. Alcohol withdrawal;  Patient with less tremors noted.  Concern for alcohol withdrawal.  Discontinued the Ativan CIWA protocol.  Some improvement with tremors.  Continue Librium withdrawal protocol.  Continue home regimen Lasix and spironolactone and lactulose. Follow.   3-History of seizure; depression, schizophrenia.  Patient with no seizures noted.  Continue home regimen of Dilantin, BuSpar, trazodone, gabapentin, Topamax.  Outpatient follow-up.   4-Chronic thrombocytopenia;  Secondary to cirrhosis.  Patient with no overt bleeding.  Platelet count slowly trending up currently at 56.  Follow.   5.Homelessness / financial barriers  - SW consult for medication access (ie PO abx) and disposition planning.  6. Hypokalemia;   Repleted.  Follow.   7.  Gastroesophageal reflux disease Continue Pepcid  8.  Neutropenia Likely secondary to cirrhosis.  Follow-up     DVT prophylaxis: SCDs Code  Status: Full Family Communication: Updated patient.  No family at bedside. Disposition Plan: Likely back to homeless shelter when clinically  improved.   Consultants:   Hand surgery: Dr. Fredna Dow 09/18/2018  Procedures:   CT left forearm 09/15/2018  CT left elbow 09/15/2018  Incision and drainage of left olecranon bursa, left proximal forearm infection per Dr. Fredna Dow 09/18/2018  Antimicrobials:   IV vancomycin 09/14/2018>>>>09/17/2018  IV Ancef 09/14/2018  Doxycycline 09/17/2018>>> 09/18/2018  IV vancomycin 09/18/2018   Subjective: Patient complaining of significant pain in left upper extremity states oxycodone is not helping much with the pain.  Patient states Percocet she was on before helped better.  Denies any chest pain.  No shortness of breath.  No abdominal pain.  Tremors improving.   Objective: Vitals:   09/19/18 1300 09/19/18 1847 09/19/18 2044 09/20/18 0543  BP: 119/81 99/66 106/81 95/60  Pulse: 68 88 86 80  Resp: 20 20 18 19   Temp: 98.3 F (36.8 C) 98.2 F (36.8 C) 98.2 F (36.8 C) 97.6 F (36.4 C)  TempSrc: Oral Oral Oral Oral  SpO2: 99% 95% 92% 93%  Weight:    65.3 kg  Height:        Intake/Output Summary (Last 24 hours) at 09/20/2018 1124 Last data filed at 09/19/2018 1700 Gross per 24 hour  Intake 570 ml  Output -  Net 570 ml   Filed Weights   09/14/18 2045 09/20/18 0543  Weight: 59.5 kg 65.3 kg    Examination:  General exam: Less Tremors. Respiratory system: CTAB.  No wheezes, no crackles, no rhonchi.  Lungs clear to auscultation bilaterally.  No wheezes, no crackles, no rhonchi.  Normal respiratory effort.   Cardiovascular system: RRR no murmurs rubs or gallops.  No JVD.  No lower extremity edema.  Gastrointestinal system: Abdomen is nontender, nondistended, soft, positive bowel sounds.  No rebound.  No guarding.   Central nervous system: Alert and oriented. No focal neurological deficits. Extremities: Left upper extremity with ACE bandage dressing noted.  Some swelling in hand and fingers however able to move fingers and squeeze hands.  Pulses noted.  Skin: No rashes, lesions or  ulcers Psychiatry: Judgement and insight appear normal. Mood & affect appropriate.     Data Reviewed: I have personally reviewed following labs and imaging studies  CBC: Recent Labs  Lab 09/15/18 0611 09/16/18 0559 09/17/18 0805 09/18/18 0714 09/19/18 0709 09/20/18 0553  WBC 5.4 3.8* 2.8* 2.9* 3.9* 3.6*  NEUTROABS 3.5 1.9 1.4* 1.1* 2.8  --   HGB 11.4* 12.5 12.7 12.7 12.7 12.7  HCT 35.9* 38.8 40.2 40.0 40.3 41.3  MCV 89.1 89.2 89.9 88.9 91.6 91.0  PLT 36* 46* 52* 53* 57* 56*   Basic Metabolic Panel: Recent Labs  Lab 09/15/18 0611 09/16/18 0559 09/17/18 0805 09/18/18 0714 09/19/18 0709 09/20/18 0553  NA 136 137 133* 136 134* 137  K 3.8 3.7 3.8 3.9 3.9 3.5  CL 109 111 106 104 102 104  CO2 20* 19* 20* 23 24 26   GLUCOSE 97 99 133* 92 102* 98  BUN 7 5* 5* 8 7 6   CREATININE 0.74 0.59 0.67 0.63 0.61 0.46  CALCIUM 8.3* 8.4* 8.4* 8.4* 8.8* 8.2*  MG 1.7 1.8 1.8 2.0  --  1.8   GFR: Estimated Creatinine Clearance: 78 mL/min (by C-G formula based on SCr of 0.46 mg/dL). Liver Function Tests: Recent Labs  Lab 09/13/18 1705  AST 31  ALT 27  ALKPHOS 89  BILITOT 2.0*  PROT 7.6  ALBUMIN  3.1*   No results for input(s): LIPASE, AMYLASE in the last 168 hours. No results for input(s): AMMONIA in the last 168 hours. Coagulation Profile: No results for input(s): INR, PROTIME in the last 168 hours. Cardiac Enzymes: Recent Labs  Lab 09/13/18 1705  CKTOTAL 49   BNP (last 3 results) No results for input(s): PROBNP in the last 8760 hours. HbA1C: No results for input(s): HGBA1C in the last 72 hours. CBG: No results for input(s): GLUCAP in the last 168 hours. Lipid Profile: No results for input(s): CHOL, HDL, LDLCALC, TRIG, CHOLHDL, LDLDIRECT in the last 72 hours. Thyroid Function Tests: No results for input(s): TSH, T4TOTAL, FREET4, T3FREE, THYROIDAB in the last 72 hours. Anemia Panel: No results for input(s): VITAMINB12, FOLATE, FERRITIN, TIBC, IRON, RETICCTPCT in the  last 72 hours. Sepsis Labs: Recent Labs  Lab 09/13/18 1720  LATICACIDVEN 1.79    Recent Results (from the past 240 hour(s))  Blood culture (routine x 2)     Status: None   Collection Time: 09/13/18  6:24 PM  Result Value Ref Range Status   Specimen Description   Final    BLOOD RIGHT ARM Performed at [facility], [place] [address] [place], [place] [address]    Special Requests   Final    BOTTLES DRAWN AEROBIC AND ANAEROBIC Blood Culture adequate volume Performed at [facility] [address], [place], [place] [address]    Culture   Final    NO GROWTH 5 DAYS Performed at [facility], [place] [address] [place], [place] [address]    Report Status 09/19/2018 FINAL  Final  Blood culture (routine x 2)     Status: None   Collection Time: 09/13/18  6:29 PM  Result Value Ref Range Status   Specimen Description   Final    BLOOD RIGHT ANTECUBITAL Performed at [facility] [address] [place], [place] [address]    Special Requests   Final    BOTTLES DRAWN AEROBIC AND ANAEROBIC Blood Culture adequate volume Performed at [facility] [address], [place], [place] [address]    Culture   Final    NO GROWTH 5 DAYS Performed at [facility], [place] [address] [place], [place] [address]    Report Status 09/19/2018 FINAL  Final  Aerobic/Anaerobic Culture (surgical/deep wound)     Status: None (Preliminary result)   Collection Time: 09/18/18  6:08 PM  Result Value Ref Range Status   Specimen Description   Final    ARM LEFT Performed at [facility] [address] [place], [place] [address]    Special Requests   Final    NONE Performed at [facility], [place] [address] [place], [place] [address]    Gram Stain   Final    FEW WBC PRESENT,BOTH PMN AND MONONUCLEAR NO ORGANISMS SEEN Gram Stain Report Called to,Read Back By and Verified With: S.HUERTAS AT  2110 ON 09/18/18 BY N.Cherrell Maybee Performed at [facility], [place] [address], [place], [place] [address]    Culture   Final    NO GROWTH < 24 HOURS Performed at [facility] [address] [place],  [address]    Report Status PENDING  Incomplete         Radiology Studies: [place] Lt Upper Extrem Ltd Soft Tissue Non Vascular  Result Date: 09/18/2018 CLINICAL DATA:  Left forearm redness, pain and swelling. EXAM: ULTRASOUND LEFT  UPPER EXTREMITY LIMITED TECHNIQUE: Ultrasound examination of the upper extremity soft tissues was performed in the area of clinical concern. COMPARISON:  CT scan dated 09/15/2018 FINDINGS: There is a small amount of fluid deep to the subcutaneous fat and adjacent to the superficial fascia of the muscle without a definable abscess. No other abnormality. This fluid collection measures 13 x 38 x 4 mm. IMPRESSION: Poorly defined subcutaneous fluid collection in the left forearm near the elbow. No defined wall. This could represent an early abscess or sterile fluid. Electronically Signed   By: Lorriane Shire M.D.   On: 09/18/2018 14:03        Scheduled Meds: . busPIRone  10 mg Oral BID  . famotidine  20 mg Oral Daily  . FLUoxetine  20 mg Oral Daily  . folic acid  1 mg Oral Daily  . furosemide  20 mg Oral Daily  . gabapentin  200 mg Oral TID  . lactulose  20 g Oral Daily  . multivitamin with minerals  1 tablet Oral Daily  . phenytoin  300 mg Oral Daily  . spironolactone  50 mg Oral Daily  . thiamine  100 mg Oral Daily  . topiramate  50 mg Oral Daily   Continuous Infusions: .  ceFAZolin (ANCEF) IV 2 g (09/20/18 1610)  . vancomycin 1,250 mg (09/20/18 0916)     LOS: 5 days    Time spent: 35 minutes    Irine Seal, MD [facility]  If 7PM-7AM, please contact night-coverage [email or web address] 09/20/2018, 11:24 AM

## 2018-09-21 LAB — CBC
HCT: 38.6 % (ref 36.0–46.0)
Hemoglobin: 12 g/dL (ref 12.0–15.0)
MCH: 29 pg (ref 26.0–34.0)
MCHC: 31.1 g/dL (ref 30.0–36.0)
MCV: 93.2 fL (ref 80.0–100.0)
Platelets: 62 10*3/uL — ABNORMAL LOW (ref 150–400)
RBC: 4.14 MIL/uL (ref 3.87–5.11)
RDW: 20.1 % — ABNORMAL HIGH (ref 11.5–15.5)
WBC: 3.8 10*3/uL — ABNORMAL LOW (ref 4.0–10.5)
nRBC: 0 % (ref 0.0–0.2)

## 2018-09-21 LAB — BASIC METABOLIC PANEL
Anion gap: 8 (ref 5–15)
BUN: 5 mg/dL — ABNORMAL LOW (ref 6–20)
CO2: 26 mmol/L (ref 22–32)
Calcium: 8.6 mg/dL — ABNORMAL LOW (ref 8.9–10.3)
Chloride: 102 mmol/L (ref 98–111)
Creatinine, Ser: 0.63 mg/dL (ref 0.44–1.00)
GFR calc Af Amer: >60 mL/min (ref >60)
GFR calc non Af Amer: >60 mL/min (ref >60)
Glucose, Bld: 103 mg/dL — ABNORMAL HIGH (ref 70–99)
Potassium: 3.9 mmol/L (ref 3.5–5.1)
Sodium: 136 mmol/L (ref 135–145)

## 2018-09-21 LAB — MAGNESIUM: Magnesium: 1.9 mg/dL (ref 1.7–2.4)

## 2018-09-21 MED ORDER — MAGNESIUM CITRATE PO SOLN
1.0000 | Freq: Once | ORAL | Status: AC
  Administered 2018-09-21: 1 via ORAL
  Filled 2018-09-21: qty 296

## 2018-09-21 MED ORDER — HYDROXYZINE HCL 25 MG PO TABS
25.0000 mg | ORAL_TABLET | Freq: Four times a day (QID) | ORAL | Status: DC | PRN
  Administered 2018-09-21 – 2018-09-22 (×3): 25 mg via ORAL
  Filled 2018-09-21 (×4): qty 1

## 2018-09-21 MED ORDER — CHLORDIAZEPOXIDE HCL 25 MG PO CAPS
25.0000 mg | ORAL_CAPSULE | ORAL | Status: DC | PRN
  Administered 2018-09-22: 25 mg via ORAL
  Filled 2018-09-21: qty 1

## 2018-09-21 MED ORDER — DOXYCYCLINE HYCLATE 100 MG PO TABS
100.0000 mg | ORAL_TABLET | Freq: Two times a day (BID) | ORAL | Status: DC
  Administered 2018-09-21 – 2018-09-22 (×3): 100 mg via ORAL
  Filled 2018-09-21 (×3): qty 1

## 2018-09-21 NOTE — Progress Notes (Addendum)
   09/21/18 1224 Hydrotherapy Evaluation 1130-1206  Subjective Assessment  Subjective How long will it take to heal  Patient and Family Stated Goals to heal and use my arm  Date of Onset  (I and D 09/17/18)  Prior Treatments Iand D  Evaluation and Treatment  Evaluation and Treatment Procedures Explained to Patient/Family Yes  Evaluation and Treatment Procedures agreed to  Wound / Incision (Open or Dehisced) 09/21/18 Incision - Open Elbow Left;Lateral, there are 2 open wounds, proximal is very small , concentrated PLS on distal.  Date First Assessed/Time First Assessed: 09/21/18 1130   Wound Type: Incision - Open  Location: Elbow  Location Orientation: Left;Lateral  Dressing Type ABD;Gauze (Comment) (iodoform)  Dressing Changed New  Dressing Status Clean;Dry  Dressing Change Frequency Daily  Site / Wound Assessment Clean;Painful;Pink  % Wound base Red or Granulating  (superficial is pink, wound is deep, unable to visualize)  % Wound base Yellow/Fibrinous Exudate  (unable to visualize inside wound)  % Wound base Black/Eschar 0%  Peri-wound Assessment Intact;Edema;Erythema (blanchable)  Wound Length (cm) 6 cm  Wound Width (cm) 1 cm  Wound Depth (cm) 4 cm  Wound Volume (cm^3) 24 cm^3  Wound Surface Area (cm^2) 6 cm^2  Margins Unattached edges (unapproximated)  Drainage Amount Minimal  Drainage Description Serosanguineous  Non-staged Wound Description Not applicable  Treatment Hydrotherapy (Pulse lavage);Packing (Impregnated strip)  Hydrotherapy  Pulsed lavage therapy - wound location left elbow   Pulsed Lavage with Suction (psi) 4 psi  Pulsed Lavage with Suction - Normal Saline Used 500 mL  Pulsed Lavage Tip Tip with splash shield  Wound Therapy - Assess/Plan/Recommendations  Wound Therapy - Clinical Statement The patient found with left elbow in mission sling. Patient has 2 open I and D areas, the distal wound is open, the proximal is much smaller ,<.5 cm with depth .5 cm. . .  the patient was premedicated and tolerated PLS well. Packing strips removed and then replaced. Encouraged patient to erform /AROM of fingers and elbow. The patient will benefit from [facility] and dressing  change  until drainage is clear. today, old iodoform  noted with yellow drainage when removed.   Wound Therapy - Functional Problem List none  Factors Delaying/Impairing Wound Healing Infection - systemic/local  Hydrotherapy Plan Dressing change;Pulsatile lavage with suction  Wound Therapy - Frequency 6X / week  Wound Therapy - Follow Up Recommendations  (per MD recommendation)  Wound Plan PLS, dressing change  Wound Therapy Goals - Improve the function of patient's integumentary system by progressing the wound(s) through the phases of wound healing by:  Improve Drainage Characteristics Min  Improve Drainage Characteristics - Progress Goal set today  Patient/Family will be able to  perform ROM left UE  Patient/Family Instruction Goal - Progress Goal set today  Goals/treatment plan/discharge plan were made with and agreed upon by patient/family Yes  Time For Goal Achievement 5 days  Wound Therapy - Potential for Goals Good  Tresa Endo PT [place] Pager [phone] Office [phone]

## 2018-09-21 NOTE — Progress Notes (Signed)
PROGRESS NOTE    Briana Sweeney  OFB:[id number] DOB: [birth year] DOA: 09/13/2018 PCP: [name], FNP    Brief Narrative:  48 year old with past medical history significant for alcoholic cirrhosis, depression, schizophrenia, prior alcohol withdrawals, history of seizure who presents complaining of left hand and forearm swelling after recent ED encounter on 09/11/2018.  Patient presented with a syncope episode on 09/11/2017 was reportedly intoxicated.  She received IV Dilantin load in the ED due to prior history of seizures through the IV inserted in the left Hein.  She reported feeling burning sensation during Dilantin administration.  Following day she wake up with redness swelling in the left hand and into the forearm.  Patient was admitted with left forearm and hand cellulitis.  Was a started on IV Ancef subsequently added vancomycin.  White blood cell has been coming down.  Edema and redness has slightly improved today.  She was not able to tolerate MRI we proceeded with CT today.    Assessment & Plan:   Principal Problem:   Cellulitis of left forearm Active Problems:   Hepatitis C antibody positive in blood   Alcoholic cirrhosis of liver with ascites (HCC)   Esophageal varices without bleeding (HCC)   Alcohol abuse with alcohol-induced mood disorder (HCC)   Alcohol dependence (HCC)   GERD (gastroesophageal reflux disease)   Chronic anemia   Erysipelas  1-Left hand , forearm Cellulitis; with edema, redness;/left septic olecranon bursitis and left proximal forearm infection Questionable etiology.  Patient prior to admission had left upper extremity Dopplers done which were negative for DVT.  Patient still with some erythema, warmth, tenderness to palpation and edema.  Slight improvement with erythema however patient with an area of induration just below the elbow which was very tender to palpation.  Concern for possible abscess formation.  Patient is on IV Ancef.  Patient was on IV  vancomycin for MRSA coverage and was discontinued and transition to oral doxycycline on 09/17/2018.  Due to concern for induration/abscess IV vancomycin was resumed and doxy discontinued.  Patient was seen in consultation by hand surgery who felt patient likely had a left septic olecranon bursitis and left proximal forearm infection.  Patient status post incision and drainage with cultures pending with no growth to date. Continue empiric IV Ancef for now.  Discontinue IV vancomycin and placed on oral doxycycline.  Per hand surgeon wound has been packed with quarter inch iodoform gauze and recommending to start hydrotherapy tomorrow.  Ace bandage was loosened on 09/20/2018 with improvement with left hand pain and swelling.  Continue mission sling.  Patient for hydrotherapy starting today. Hand surgery following and appreciate input and recommendations.   2-History of alcoholic cirrhosis. Alcohol withdrawal;  Patient with less tremors noted.  Concern for alcohol withdrawal.  Discontinued the Ativan CIWA protocol.  Tremors improving on Librium withdrawal protocol. Continue home regimen Lasix and spironolactone and lactulose. Follow.   3-History of seizure; depression, schizophrenia.  Patient with no seizures noted.  Continue home regimen of Dilantin, BuSpar, trazodone, gabapentin, Topamax.  Outpatient follow-up.   4-Chronic thrombocytopenia;  Secondary to cirrhosis.  Patient denies any overt bleeding.  Platelet count slowly trending up currently at 62.  5.Homelessness / financial barriers  - SW consult for medication access (ie PO abx) and disposition planning.  6. Hypokalemia;   Repleted.  Follow.   7.  Gastroesophageal reflux disease Continue Pepcid.    8.  Neutropenia Likely secondary to cirrhosis.  Follow-up  9.  Constipation Mag citrate.  DVT prophylaxis: SCDs Code Status: Full Family Communication: Updated patient.  No family at bedside. Disposition Plan: Likely back to  homeless shelter when clinically improved hopefully in the next 24 to 48 hours.   Consultants:   Hand surgery: Dr. Fredna Dow 09/18/2018  Procedures:   CT left forearm 09/15/2018  CT left elbow 09/15/2018  Incision and drainage of left olecranon bursa, left proximal forearm infection per Dr. Fredna Dow 09/18/2018  Antimicrobials:   IV vancomycin 09/14/2018>>>>09/17/2018  IV Ancef 09/14/2018  Doxycycline 09/17/2018>>> 09/18/2018  IV vancomycin 09/18/2018>>>> 09/21/2018  Doxycycline 09/21/2018   Subjective: Patient laying in bed.  Complaining of left upper extremity pain.  Feeling better.  Complaining of constipation.  Denies any chest pain.  No shortness of breath.  Tremors improving.    Objective: Vitals:   09/20/18 0543 09/20/18 1359 09/20/18 2052 09/21/18 0534  BP: 95/60 101/74 (!) 102/59 101/70  Pulse: 80 84 83 78  Resp: 19 18 19 19   Temp: 97.6 F (36.4 C) 98.7 F (37.1 C) 98.5 F (36.9 C) 97.7 F (36.5 C)  TempSrc: Oral Oral Oral Oral  SpO2: 93% 93% 93% 92%  Weight: 65.3 kg     Height:        Intake/Output Summary (Last 24 hours) at 09/21/2018 0958 Last data filed at 09/21/2018 0817 Gross per 24 hour  Intake 260 ml  Output -  Net 260 ml   Filed Weights   09/14/18 2045 09/20/18 0543  Weight: 59.5 kg 65.3 kg    Examination:  General exam: Less Tremors. Respiratory system: Lungs clear to auscultation bilaterally.  No wheezes, no crackles, no rhonchi.  Normal respiratory effort.   Cardiovascular system: Regular rate rhythm no murmurs rubs or gallops.  No JVD.  No lower extremity edema.   Gastrointestinal system: Abdomen is soft, nontender, nondistended, positive bowel sounds.  No rebound.  No guarding.  Central nervous system: Alert and oriented. No focal neurological deficits. Extremities: Left upper extremity with ACE bandage dressing noted.  Left upper extremity in mission sling.  Decreased swelling in hand.  Skin: No rashes, lesions or ulcers Psychiatry: Judgement and  insight appear normal. Mood & affect appropriate.     Data Reviewed: I have personally reviewed following labs and imaging studies  CBC: Recent Labs  Lab 09/15/18 0611 09/16/18 0559 09/17/18 0805 09/18/18 0714 09/19/18 0709 09/20/18 0553 09/21/18 0625  WBC 5.4 3.8* 2.8* 2.9* 3.9* 3.6* 3.8*  NEUTROABS 3.5 1.9 1.4* 1.1* 2.8  --   --   HGB 11.4* 12.5 12.7 12.7 12.7 12.7 12.0  HCT 35.9* 38.8 40.2 40.0 40.3 41.3 38.6  MCV 89.1 89.2 89.9 88.9 91.6 91.0 93.2  PLT 36* 46* 52* 53* 57* 56* 62*   Basic Metabolic Panel: Recent Labs  Lab 09/16/18 0559 09/17/18 0805 09/18/18 0714 09/19/18 0709 09/20/18 0553 09/21/18 0625  NA 137 133* 136 134* 137 136  K 3.7 3.8 3.9 3.9 3.5 3.9  CL 111 106 104 102 104 102  CO2 19* 20* 23 24 26 26   GLUCOSE 99 133* 92 102* 98 103*  BUN 5* 5* 8 7 6  5*  CREATININE 0.59 0.67 0.63 0.61 0.46 0.63  CALCIUM 8.4* 8.4* 8.4* 8.8* 8.2* 8.6*  MG 1.8 1.8 2.0  --  1.8 1.9   GFR: Estimated Creatinine Clearance: 78 mL/min (by C-G formula based on SCr of 0.63 mg/dL). Liver Function Tests: No results for input(s): AST, ALT, ALKPHOS, BILITOT, PROT, ALBUMIN in the last 168 hours. No results for input(s): LIPASE, AMYLASE in  the last 168 hours. No results for input(s): AMMONIA in the last 168 hours. Coagulation Profile: No results for input(s): INR, PROTIME in the last 168 hours. Cardiac Enzymes: No results for input(s): CKTOTAL, CKMB, CKMBINDEX, TROPONINI in the last 168 hours. BNP (last 3 results) No results for input(s): PROBNP in the last 8760 hours. HbA1C: No results for input(s): HGBA1C in the last 72 hours. CBG: No results for input(s): GLUCAP in the last 168 hours. Lipid Profile: No results for input(s): CHOL, HDL, LDLCALC, TRIG, CHOLHDL, LDLDIRECT in the last 72 hours. Thyroid Function Tests: No results for input(s): TSH, T4TOTAL, FREET4, T3FREE, THYROIDAB in the last 72 hours. Anemia Panel: No results for input(s): VITAMINB12, FOLATE, FERRITIN,  TIBC, IRON, RETICCTPCT in the last 72 hours. Sepsis Labs: No results for input(s): PROCALCITON, LATICACIDVEN in the last 168 hours.  Recent Results (from the past 240 hour(s))  Blood culture (routine x 2)     Status: None   Collection Time: 09/13/18  6:24 PM  Result Value Ref Range Status   Specimen Description   Final    BLOOD RIGHT ARM Performed at [facility] [address] [place], [place] [address]    Special Requests   Final    BOTTLES DRAWN AEROBIC AND ANAEROBIC Blood Culture adequate volume Performed at [facility] [address], [place], [place] [address]    Culture   Final    NO GROWTH 5 DAYS Performed at [facility], [place] [address] [place], [place] [address]    Report Status 09/19/2018 FINAL  Final  Blood culture (routine x 2)     Status: None   Collection Time: 09/13/18  6:29 PM  Result Value Ref Range Status   Specimen Description   Final    BLOOD RIGHT ANTECUBITAL Performed at [facility] [address] [place], [place] [address]    Special Requests   Final    BOTTLES DRAWN AEROBIC AND ANAEROBIC Blood Culture adequate volume Performed at [facility] [address], [place], [place] [address]    Culture   Final    NO GROWTH 5 DAYS Performed at [facility], [place] [address], [place], [place] [address]    Report Status 09/19/2018 FINAL  Final  Aerobic/Anaerobic Culture (surgical/deep wound)     Status: None (Preliminary result)   Collection Time: 09/18/18  6:08 PM  Result Value Ref Range Status   Specimen Description   Final    ARM LEFT Performed at [facility] [address] [place], [place] [address]    Special Requests   Final    NONE Performed at [facility], [place] [address], [place], [place] [address]    Gram Stain   Final    FEW WBC PRESENT,BOTH PMN AND MONONUCLEAR NO ORGANISMS SEEN Gram Stain Report Called  to,Read Back By and Verified With: S.HUERTAS AT 2110 ON 09/18/18 BY N.Veronia Laprise Performed at [facility], [place] [address], [place], [place] [address]    Culture   Final    NO GROWTH 2 DAYS Performed at [facility] [address], [place], [place] [address]    Report Status PENDING  Incomplete         Radiology Studies: No results found.      Scheduled Meds: . busPIRone  10 mg Oral BID  . doxycycline  100 mg Oral Q12H  . famotidine  20 mg Oral Daily  .  FLUoxetine  20 mg Oral Daily  . folic acid  1 mg Oral Daily  . furosemide  20 mg Oral Daily  . gabapentin  200 mg Oral TID  . lactulose  20 g Oral Daily  . magnesium citrate  1 Bottle Oral Once  . multivitamin with minerals  1 tablet Oral Daily  . phenytoin  300 mg Oral Daily  . spironolactone  50 mg Oral Daily  . thiamine  100 mg Oral Daily  . topiramate  50 mg Oral Daily   Continuous Infusions: .  ceFAZolin (ANCEF) IV 2 g (09/21/18 0935)     LOS: 6 days    Time spent: 35 minutes    Irine Seal, MD [facility]  If 7PM-7AM, please contact night-coverage [email or web address] 09/21/2018, 9:58 AM

## 2018-09-22 DIAGNOSIS — M7022 Olecranon bursitis, left elbow: Secondary | ICD-10-CM

## 2018-09-22 DIAGNOSIS — D649 Anemia, unspecified: Secondary | ICD-10-CM

## 2018-09-22 MED ORDER — HYDROXYZINE HCL 25 MG PO TABS: 25.0000 mg | ORAL_TABLET | Freq: Four times a day (QID) | ORAL | 0 refills | Status: DC | PRN

## 2018-09-22 MED ORDER — DOXYCYCLINE HYCLATE 100 MG PO TABS: 100.0000 mg | ORAL_TABLET | Freq: Two times a day (BID) | ORAL | 0 refills | Status: AC

## 2018-09-22 MED ORDER — ALUM & MAG HYDROXIDE-SIMETH 200-200-20 MG/5ML PO SUSP
15.0000 mL | ORAL | Status: DC | PRN
  Administered 2018-09-22: 15 mL via ORAL
  Filled 2018-09-22: qty 30

## 2018-09-22 MED ORDER — FUROSEMIDE 20 MG PO TABS: 20.0000 mg | ORAL_TABLET | Freq: Every day | ORAL | 0 refills | Status: DC

## 2018-09-22 MED ORDER — SPIRONOLACTONE 50 MG PO TABS: 50.0000 mg | ORAL_TABLET | Freq: Every day | ORAL | 0 refills | Status: DC

## 2018-09-22 MED ORDER — OXYCODONE-ACETAMINOPHEN 7.5-325 MG PO TABS: 1.0000 | ORAL_TABLET | Freq: Four times a day (QID) | ORAL | 0 refills | Status: DC | PRN

## 2018-09-22 MED ORDER — HYDROXYZINE HCL 25 MG PO TABS
25.0000 mg | ORAL_TABLET | Freq: Once | ORAL | Status: AC
  Administered 2018-09-22: 25 mg via ORAL

## 2018-09-22 MED ORDER — CEPHALEXIN 500 MG PO CAPS
500.0000 mg | ORAL_CAPSULE | Freq: Four times a day (QID) | ORAL | Status: DC
  Administered 2018-09-22: 500 mg via ORAL
  Filled 2018-09-22: qty 1

## 2018-09-22 MED ORDER — CEPHALEXIN 500 MG PO CAPS: 500.0000 mg | ORAL_CAPSULE | Freq: Four times a day (QID) | ORAL | 0 refills | Status: AC

## 2018-09-22 MED ORDER — FAMOTIDINE 20 MG PO TABS: 20.0000 mg | ORAL_TABLET | Freq: Every day | ORAL | 0 refills | Status: DC
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
## 2018-09-22 NOTE — Care Management Note (Signed)
Case Management Note  Patient Details  Name: Briana Sweeney MRN: [id number] Date of Birth: December 03, 1970  Subjective/Objective:                  Return to top of Cellulitis RRG - [place]  Discharge readiness is indicated by patient meeting Recovery Milestones, including ALL of the following: ? Hemodynamic stability  yes ? Skin exam stable or improved yes ? Mental status at baseline yes ? Fever absent or improved yes ? Antibiotic treatment needs appropriate for next level of care ? yes ? Pain absent or manageable at next level of care yes ? Ambulatory yes ? Oral hydration, medications,[M] and diet  Po abx and outpt hydrotherapy set up pt has appointments ? Discharge plans and education understood  yes   Action/Plan: Discharged to home see previous note concerning dc plans and appointments  Expected Discharge Date:   [id number]               Expected Discharge Plan:  Home/Self Care  In-House Referral:  Financial Counselor  Discharge planning Services  CM Consult, Follow-up appt scheduled, [facility]  Post Acute Care Choice:    Choice offered to:     DME Arranged:    DME Agency:     HH Arranged:    [place] Agency:     Status of Service:  Completed, signed off  If discussed at [employer], dates discussed:    Additional Comments:  Leeroy Cha, RN 09/22/2018, 11:05 AM

## 2018-09-22 NOTE — Progress Notes (Signed)
LCSW left taxi voucher on patient chart for transport at dc.   Briana Sweeney

## 2018-09-22 NOTE — Progress Notes (Signed)
09/22/18 1200  Hydrotherapy treatment note  Subjective Assessment  Subjective will it heal?  Patient and Family Stated Goals to heal and use my arm  Date of Onset  (I and D 09/17/18)  Prior Treatments Iand D this admit.  Evaluation and Treatment  Evaluation and Treatment Procedures Explained to Patient/Family Yes  Evaluation and Treatment Procedures agreed to  Wound / Incision (Open or Dehisced) 09/21/18 Incision - Open Elbow Left;Lateral  Date First Assessed/Time First Assessed: 09/21/18 1130   Wound Type: Incision - Open  Location: Elbow  Location Orientation: Left;Lateral  Dressing Type ABD;Gauze (Comment);Compression wrap (iodoform)  Dressing Changed New  Dressing Status Clean;Dry  Dressing Change Frequency Daily  Site / Wound Assessment Clean;Painful;Pink  % Wound base Red or Granulating  (superficial is pink, wound is deep, unable to visualize)  % Wound base Yellow/Fibrinous Exudate  (unable to visualize inside wound)  % Wound base Black/Eschar 0%  Peri-wound Assessment Intact;Edema;Erythema (blanchable) (edema has decreased)  Wound Length (cm) 6 cm  Wound Width (cm) 1 cm  Wound Depth (cm) 3 cm  Wound Volume (cm^3) 18 cm^3  Wound Surface Area (cm^2) 6 cm^2  Margins Unattached edges (unapproximated)  Drainage Amount Minimal  Drainage Description Serosanguineous  Non-staged Wound Description Not applicable  Hydrotherapy  Pulsed lavage therapy - wound location left elbow   Pulsed Lavage with Suction (psi) 4 psi  Pulsed Lavage with Suction - Normal Saline Used 1000 mL  Pulsed Lavage Tip Tip with splash shield  Wound Therapy - Assess/Plan/Recommendations  Wound Therapy - Clinical Statement The patieient tolerated PLS. the smaller, proximal wound is  difficult to pack/ the arm edema is decreased. Plans to DC today and return to surgeon's office tomorrow.   Wound Therapy - Functional Problem List none  Factors Delaying/Impairing Wound Healing Infection - systemic/local   Hydrotherapy Plan Dressing change;Pulsatile lavage with suction  Wound Therapy - Frequency 6X / week  Wound Therapy - Follow Up Recommendations  (per MD recommendation)  Wound Plan PLS, dressing change  Wound Therapy Goals - Improve the function of patient's integumentary system by progressing the wound(s) through the phases of wound healing by:  Improve Drainage Characteristics Min  Improve Drainage Characteristics - Progress Progressing toward goal  Patient/Family will be able to  perform ROM left UE  Goals/treatment plan/discharge plan were made with and agreed upon by patient/family Yes  Time For Goal Achievement 5 days  Wound Therapy - Potential for Goals Good     09/22/18 1200  Subjective Assessment  Subjective will it heal?  Patient and Family Stated Goals to heal and use my arm  Date of Onset  (I and D 09/17/18)  Prior Treatments Iand D  Evaluation and Treatment  Evaluation and Treatment Procedures Explained to Patient/Family Yes  Evaluation and Treatment Procedures agreed to  Wound / Incision (Open or Dehisced) 09/21/18 Incision - Open Elbow Left;Lateral  Date First Assessed/Time First Assessed: 09/21/18 1130   Wound Type: Incision - Open  Location: Elbow  Location Orientation: Left;Lateral  Dressing Type ABD;Gauze (Comment);Compression wrap (iodoform)  Dressing Changed New  Dressing Status Clean;Dry  Dressing Change Frequency Daily  Site / Wound Assessment Clean;Painful;Pink  % Wound base Red or Granulating  (superficial is pink, wound is deep, unable to visualize)  % Wound base Yellow/Fibrinous Exudate  (unable to visualize inside wound)  % Wound base Black/Eschar 0%  Peri-wound Assessment Intact;Edema;Erythema (blanchable) (edema has decreased)  Wound Length (cm) 6 cm  Wound Width (cm) 1 cm  Wound  Depth (cm) 3 cm  Wound Volume (cm^3) 18 cm^3  Wound Surface Area (cm^2) 6 cm^2  Margins Unattached edges (unapproximated)  Drainage Amount Minimal  Drainage  Description Serosanguineous  Non-staged Wound Description Not applicable  Hydrotherapy  Pulsed lavage therapy - wound location left elbow   Pulsed Lavage with Suction (psi) 4 psi  Pulsed Lavage with Suction - Normal Saline Used 1000 mL  Pulsed Lavage Tip Tip with splash shield  Wound Therapy - Assess/Plan/Recommendations  Wound Therapy - Clinical Statement The patieient tolerated PLS. the smaller, proximal wound is  difficult to pack/ the arm edema is decreased. Plans to DC today and return to surgeon's office tomorrow.   Wound Therapy - Functional Problem List none  Factors Delaying/Impairing Wound Healing Infection - systemic/local  Hydrotherapy Plan Dressing change;Pulsatile lavage with suction  Wound Therapy - Frequency 6X / week  Wound Therapy - Follow Up Recommendations  (per MD recommendation)  Wound Plan PLS, dressing change  Wound Therapy Goals - Improve the function of patient's integumentary system by progressing the wound(s) through the phases of wound healing by:  Improve Drainage Characteristics Min  Improve Drainage Characteristics - Progress Progressing toward goal  Patient/Family will be able to  perform ROM left UE  Goals/treatment plan/discharge plan were made with and agreed upon by patient/family Yes  Time For Goal Achievement 5 days  Wound Therapy - Potential for Goals Good  Tresa Endo PT [place] Pager [phone] Office [phone]

## 2018-09-22 NOTE — Care Management Note (Addendum)
Case Management Note  Patient Details  Name: Briana Sweeney MRN: [id number] Date of Birth: [birth year]  Subjective/Objective:                  md appointment for hand hydrotherapy Follow uo appointment at [facility]  Action/Plan: Call to Dr. Fredna Dow, Kevin's office spoke with Autumn wcb.  [phone] given for call back [email or web address] on [phone]=wcb  [email or web address] appointment for hydrotherapy is on [phone] at 3:30pm. Sickle cell clinic [phone] at 1320. Instructions given to patient and through the discharge summary. Expected Discharge Date:                  Expected Discharge Plan:     In-House Referral:     Discharge planning Services     Post Acute Care Choice:    Choice offered to:     DME Arranged:    DME Agency:     HH Arranged:    HH Agency:     Status of Service:     If discussed at [employer], dates discussed:    Additional Comments:  Leeroy Cha, RN 09/22/2018, 10:25 AM

## 2018-09-23 DIAGNOSIS — M71022 Abscess of bursa, left elbow: Secondary | ICD-10-CM | POA: Insufficient documentation

## 2018-09-23 HISTORY — DX: Abscess of bursa, left elbow: M71.022

## 2018-09-23 LAB — AEROBIC/ANAEROBIC CULTURE W GRAM STAIN (SURGICAL/DEEP WOUND): Culture: NO GROWTH

## 2018-09-23 LAB — AEROBIC/ANAEROBIC CULTURE (SURGICAL/DEEP WOUND)

## 2018-09-23 MED FILL — DOXYCYCLINE HYCLATE 100 MG: 100 | 4 days supply | Qty: 8 | Fill #0

## 2018-09-29 ENCOUNTER — Ambulatory Visit (INDEPENDENT_AMBULATORY_CARE_PROVIDER_SITE_OTHER): Payer: PRIVATE HEALTH INSURANCE | Admitting: Family Medicine

## 2018-09-29 ENCOUNTER — Encounter: Payer: Self-pay | Admitting: Family Medicine

## 2018-09-29 VITALS — BP 100/70 | HR 64 | Temp 97.9°F | Ht 62.0 in | Wt 136.0 lb

## 2018-09-29 DIAGNOSIS — Z09 Encounter for follow-up examination after completed treatment for conditions other than malignant neoplasm: Secondary | ICD-10-CM

## 2018-09-29 DIAGNOSIS — F419 Anxiety disorder, unspecified: Secondary | ICD-10-CM

## 2018-09-29 DIAGNOSIS — L0390 Cellulitis, unspecified: Secondary | ICD-10-CM

## 2018-09-29 DIAGNOSIS — G4700 Insomnia, unspecified: Secondary | ICD-10-CM

## 2018-09-29 DIAGNOSIS — R52 Pain, unspecified: Secondary | ICD-10-CM

## 2018-09-29 MED ORDER — HYDROCODONE-ACETAMINOPHEN 10-325 MG PO TABS: 1.0000 | ORAL_TABLET | Freq: Three times a day (TID) | ORAL | 0 refills | Status: DC | PRN

## 2018-09-29 MED ORDER — TRAZODONE HCL 50 MG PO TABS: 50.0000 mg | ORAL_TABLET | Freq: Every evening | ORAL | 3 refills | Status: DC | PRN

## 2018-09-29 NOTE — Progress Notes (Addendum)
Established Patient Hospital Follow Up  Subjective:  Patient ID: Briana Sweeney, female    DOB: [birth year]  Age: 48 y.o. MRN: [id number]  CC:  Chief Complaint  Patient presents with  . Hospitalization Follow-up         HPI Briana Sweeney is a 48 year old female who presents for hospital follow up of chronic conditions.   Past Medical History:  Diagnosis Date  . Anxiety   . Bipolar affective disorder ([place])    With anxiety features  . 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 Depression   . ETOHism ([place])   . GERD (gastroesophageal reflux disease)   . Hematemesis 02/10/2018  . Hepatitis C 2018   hepatitis c and alcohol related hepatitis  . Heroin abuse ([place])   . History of blood transfusion    "blood doesn't clot; I fell down and had to have a transfusion"  . History of kidney stones   . Migraine    "when I get really stressed" (09/01/2017)  . Schizophrenia ([place])   . Seizures ([place])    "when I run out of my RX; lots recently" (09/01/2017)   Current Status: Since her last hospital stay at [facility] on 09/13/2018 for Cellulitis of left forearm, and she was discharged on 09/22/2018. She is currently receiving 'whirlpool' wound debridement and packing treatments, 2 times week. She states that she currently staying shelter at [facility] [address] in [place]. She has recently been approved for Disability. She is taking Aleve for pain, which is not taking effective. She had not been having migraines lately. She denies visual changes, chest pain, cough, shortness of breath, heart palpitations, and falls. She has occasionally headaches and dizziness with position changes. Denies severe headaches, confusion, seizures, double vision, and blurred vision, nausea and vomiting. Her situational anxiety is moderate today.  She denies suicidal ideations, homicidal ideations, or auditory hallucinations.  She denies fevers, chills, fatigue, recent infections,  weight loss, and night sweats. No reports of GI problems such as nausea, vomiting, diarrhea, and constipation. She has no reports of blood in stools, dysuria and hematuria.   Past Surgical History:  Procedure Laterality Date  . ESOPHAGOGASTRODUODENOSCOPY N/A 09/03/2017   Procedure: ESOPHAGOGASTRODUODENOSCOPY (EGD);  Surgeon: Doran Stabler, MD;  Location: [facility];  Service: Gastroenterology;  Laterality: N/A;  . FINGER FRACTURE SURGERY Left    "shattered my pinky"  . FRACTURE SURGERY    . I&D EXTREMITY Left 09/18/2018   Procedure: IRRIGATION AND DEBRIDEMENT EXTREMITY;  Surgeon: Leanora Cover, MD;  Location: [facility];  Service: Orthopedics;  Laterality: Left;  . IR PARACENTESIS  07/23/2017  . IR PARACENTESIS  07/2017   "did it twice in the same week" (09/01/2017)  . SHOULDER OPEN ROTATOR CUFF REPAIR Right   . TUBAL LIGATION    . VAGINAL HYSTERECTOMY      Family History  Problem Relation Age of Onset  . Lung cancer Mother 25  . Alcohol abuse Mother   . Throat cancer Father 23    Social History   Socioeconomic History  . Marital status: Divorced    Spouse name: Not on file  . Number of children: Not on file  . Years of education: Not on file  . Highest education level: Not on file  Occupational History  . Occupation: applying for disability  Social Needs  . Financial resource strain: Not on file  . Food insecurity:    Worry: Not  on file    Inability: Not on file  . Transportation needs:    Medical: Not on file    Non-medical: Not on file  Tobacco Use  . Smoking status: Never Smoker  . Smokeless tobacco: Never Used  Substance and Sexual Activity  . Alcohol use: Yes    Alcohol/week: 63.0 standard drinks    Types: 63 Cans of beer per week    Comment: weekly "I have cut back"  . Drug use: Yes    Types: Marijuana  . Sexual activity: Not Currently  Lifestyle  . Physical activity:    Days per week: Not on file    Minutes per session: Not on file  . Stress: Not on  file  Relationships  . Social connections:    Talks on phone: Not on file    Gets together: Not on file    Attends religious service: Not on file    Active member of club or organization: Not on file    Attends meetings of clubs or organizations: Not on file    Relationship status: Not on file  . Intimate partner violence:    Fear of current or ex partner: Not on file    Emotionally abused: Not on file    Physically abused: Not on file    Forced sexual activity: Not on file  Other Topics Concern  . Not on file  Social History Narrative   She moved with a boyfriend to [place] and was [facility].  He died of a massive heart attack in 8/18, per her report, and so she moved back to [place] and is living with a friend.    Outpatient Medications Prior to Visit  Medication Sig Dispense Refill  . busPIRone (BUSPAR) 10 MG tablet Take 1 tablet (10 mg total) by mouth 2 (two) times daily. 30 tablet 2  . famotidine (PEPCID) 20 MG tablet Take 1 tablet (20 mg total) by mouth daily. 30 tablet 0  . FLUoxetine (PROZAC) 20 MG capsule Take 1 capsule (20 mg total) by mouth daily. 30 capsule 0  . folic acid (FOLVITE) 1 MG tablet Take 1 tablet (1 mg total) by mouth daily. 30 tablet 0  . furosemide (LASIX) 20 MG tablet Take 1 tablet (20 mg total) by mouth daily. 30 tablet 0  . gabapentin (NEURONTIN) 100 MG capsule Take 2 capsules (200 mg total) by mouth 3 (three) times daily. 180 capsule 3  . hydrOXYzine (ATARAX/VISTARIL) 25 MG tablet Take 1 tablet (25 mg total) by mouth every 6 (six) hours as needed for itching. 20 tablet 0  . lactulose (CHRONULAC) 10 GM/15ML solution Take 30 mLs (20 g total) by mouth daily. 240 mL 3  . Multiple Vitamin (MULTIVITAMIN WITH MINERALS) TABS tablet Take 1 tablet by mouth daily.    . phenytoin (DILANTIN) 300 MG ER capsule Take 300 mg by mouth daily.    [name] Kitchen spironolactone (ALDACTONE) 50 MG tablet Take 1 tablet (50 mg total) by mouth daily. 30 tablet 0  .  thiamine 100 MG tablet Take 1 tablet (100 mg total) by mouth daily. 30 tablet 0  . topiramate (TOPAMAX) 50 MG tablet Take 1 tablet (50 mg total) by mouth daily. 30 tablet 2  . oxyCODONE-acetaminophen (PERCOCET) 7.5-325 MG tablet Take 1 tablet by mouth every 6 (six) hours as needed for moderate pain. (Patient not taking: Reported on 09/29/2018) 20 tablet 0  . traZODone (DESYREL) 50 MG tablet Take 1 tablet (50 mg total) by mouth  at bedtime as needed for sleep. (Patient not taking: Reported on 09/29/2018) 30 tablet 0   No facility-administered medications prior to visit.     No Known Allergies  ROS Review of Systems  Constitutional: Negative.   HENT: Negative.   Eyes: Negative.   Respiratory: Negative.   Cardiovascular: Negative.   Gastrointestinal: Negative.   Endocrine: Negative.   Genitourinary: Negative.   Musculoskeletal: Positive for arthralgias (Generalized ).  Skin: Positive for wound.       Left arm cellulits  Allergic/Immunologic: Negative.   Neurological: Positive for dizziness, tremors and headaches.  Hematological: Negative.   Psychiatric/Behavioral: The patient is nervous/anxious.    Objective:    Physical Exam  Constitutional: She is oriented to person, place, and time.    HENT:  Head: Normocephalic and atraumatic.  Neck: Normal range of motion. Neck supple.  Cardiovascular: Normal rate and regular rhythm.  Pulmonary/Chest: Effort normal and breath sounds normal.  Abdominal: Soft. Bowel sounds are normal.  Musculoskeletal: Normal range of motion.  Neurological: She is alert and oriented to person, place, and time. She has normal reflexes.  Skin: Skin is warm. Erythema: cellulitis left arm.  Psychiatric: She has a normal mood and affect. Her behavior is normal. Judgment and thought content normal.  Nursing note and vitals reviewed.   BP 100/70 (BP Location: Right Arm, Patient Position: Sitting, Cuff Size: Small)   Pulse 64   Temp 97.9 F (36.6 C) (Oral)    Ht 5\' 2"  (1.575 m)   Wt 136 lb (61.7 kg)   SpO2 96%   BMI 24.87 kg/m  Wt Readings from Last 3 Encounters:  09/29/18 136 lb (61.7 kg)  09/20/18 143 lb 15.4 oz (65.3 kg)  09/12/18 140 lb (63.5 kg)     There are no preventive care reminders to display for this patient.  There are no preventive care reminders to display for this patient.  Lab Results  Component Value Date   TSH 1.480 05/20/2018   Lab Results  Component Value Date   WBC 3.8 (L) 09/21/2018   HGB 12.0 09/21/2018   HCT 38.6 09/21/2018   MCV 93.2 09/21/2018   PLT 62 (L) 09/21/2018   Lab Results  Component Value Date   NA 136 09/21/2018   K 3.9 09/21/2018   CO2 26 09/21/2018   GLUCOSE 103 (H) 09/21/2018   BUN 5 (L) 09/21/2018   CREATININE 0.63 09/21/2018   BILITOT 2.0 (H) 09/13/2018   ALKPHOS 89 09/13/2018   AST 31 09/13/2018   ALT 27 09/13/2018   PROT 7.6 09/13/2018   ALBUMIN 3.1 (L) 09/13/2018   CALCIUM 8.6 (L) 09/21/2018   ANIONGAP 8 09/21/2018   Lab Results  Component Value Date   CHOL 164 05/20/2018   Lab Results  Component Value Date   HDL 71 05/20/2018   Lab Results  Component Value Date   LDLCALC 63 05/20/2018   Lab Results  Component Value Date   TRIG 149 05/20/2018   Lab Results  Component Value Date   CHOLHDL 2.3 05/20/2018   Lab Results  Component Value Date   HGBA1C 4.9 10/21/2017    Assessment & Plan:   1. Hospital discharge follow-up  2. Cellulitis, unspecified cellulitis site Left arm cellulitis on 09/13/2018, debrided and now receiving wound care 2 times a week.  - HYDROcodone-acetaminophen (NORCO) 10-325 MG tablet; Take 1 tablet by mouth every 8 (eight) hours as needed.  Dispense: 30 tablet; Refill: 0     3. Pain - HYDROcodone-acetaminophen (  NORCO) 10-325 MG tablet; Take 1 tablet by mouth every 8 (eight) hours as needed.  Dispense: 30 tablet; Refill: 0  4. Insomnia, unspecified type - traZODone (DESYREL) 50 MG tablet; Take 1 tablet (50 mg total) by mouth at  bedtime as needed for sleep.  Dispense: 30 tablet; Refill: 3  5. Anxiety Moderate. Continue Buspar and Prozac as prescribed.   6. Follow up She will follow up in 1 month. For assessment of wound.   Meds ordered this encounter  Medications  . traZODone (DESYREL) 50 MG tablet    Sig: Take 1 tablet (50 mg total) by mouth at bedtime as needed for sleep.    Dispense:  30 tablet    Refill:  3  . DISCONTD: HYDROcodone-acetaminophen (NORCO) 10-325 MG tablet    Sig: Take 1 tablet by mouth every 8 (eight) hours as needed.    Dispense:  30 tablet    Refill:  0    Order Specific Question:   Supervising Provider    Answer:   Tresa Garter [id number]  . HYDROcodone-acetaminophen (NORCO) 10-325 MG tablet    Sig: Take 1 tablet by mouth every 8 (eight) hours as needed.    Dispense:  30 tablet    Refill:  0    Order Specific Question:   Supervising Provider    Answer:   Tresa Garter [[id number]]   Problem List Items Addressed This Visit    None    Visit Diagnoses    Hospital discharge follow-up    -  Primary   Cellulitis, unspecified cellulitis site       Relevant Medications   HYDROcodone-acetaminophen (NORCO) 10-325 MG tablet   Pain       Insomnia, unspecified type       Relevant Medications   traZODone (DESYREL) 50 MG tablet   Anxiety       Relevant Medications   traZODone (DESYREL) 50 MG tablet   Follow up          Meds ordered this encounter  Medications  . traZODone (DESYREL) 50 MG tablet    Sig: Take 1 tablet (50 mg total) by mouth at bedtime as needed for sleep.    Dispense:  30 tablet    Refill:  3  . DISCONTD: HYDROcodone-acetaminophen (NORCO) 10-325 MG tablet    Sig: Take 1 tablet by mouth every 8 (eight) hours as needed.    Dispense:  30 tablet    Refill:  0    Order Specific Question:   Supervising Provider    Answer:   Tresa Garter [id number]  . HYDROcodone-acetaminophen (NORCO) 10-325 MG tablet    Sig: Take 1 tablet by mouth every 8 (eight)  hours as needed.    Dispense:  30 tablet    Refill:  0    Order Specific Question:   Supervising Provider    Answer:   Tresa Garter [[id number]]    Follow-up: Return in about 1 month (around 10/30/2018).    [name], FNP

## 2018-09-29 NOTE — Patient Instructions (Signed)
Cellulitis, Adult  Cellulitis is a skin infection. The infected area is often warm, red, swollen, and sore. It occurs most often in the arms and lower legs. It is very important to get treated for this condition. What are the causes? This condition is caused by bacteria. The bacteria enter through a break in the skin, such as a cut, burn, insect bite, open sore, or crack. What increases the risk? This condition is more likely to occur in people who:  Have a weak body defense system (immune system).  Have open cuts, burns, bites, or scrapes on the skin.  Are older than 47 years of age.  Have a blood sugar problem (diabetes).  Have a long-lasting (chronic) liver disease (cirrhosis) or kidney disease.  Are very overweight (obese).  Have a skin problem, such as: ? Itchy rash (eczema). ? Slow movement of blood in the veins (venous stasis). ? Fluid buildup below the skin (edema).  Have been treated with high-energy rays (radiation).  Use IV drugs. What are the signs or symptoms? Symptoms of this condition include:  Skin that is: ? Red. ? Streaking. ? Spotting. ? Swollen. ? Sore or painful when you touch it. ? Warm.  A fever.  Chills.  Blisters. How is this diagnosed? This condition is diagnosed based on:  Medical history.  Physical exam.  Blood tests.  Imaging tests. How is this treated? Treatment for this condition may include:  Medicines to treat infections or allergies.  Home care, such as: ? Rest. ? Placing cold or warm cloths (compresses) on the skin.  Hospital care, if the condition is very bad. Follow these instructions at home: Medicines  Take over-the-counter and prescription medicines only as told by your doctor.  If you were prescribed an antibiotic medicine, take it as told by your doctor. Do not stop taking it even if you start to feel better. General instructions   Drink enough fluid to keep your pee (urine) pale yellow.  Do not touch  or rub the infected area.  Raise (elevate) the infected area above the level of your heart while you are sitting or lying down.  Place cold or warm cloths on the area as told by your doctor.  Keep all follow-up visits as told by your doctor. This is important. Contact a doctor if:  You have a fever.  You do not start to get better after 1-2 days of treatment.  Your bone or joint under the infected area starts to hurt after the skin has healed.  Your infection comes back. This can happen in the same area or another area.  You have a swollen bump in the area.  You have new symptoms.  You feel ill and have muscle aches and pains. Get help right away if:  Your symptoms get worse.  You feel very sleepy.  You throw up (vomit) or have watery poop (diarrhea) for a long time.  You see red streaks coming from the area.  Your red area gets larger.  Your red area turns dark in color. These symptoms may represent a serious problem that is an emergency. Do not wait to see if the symptoms will go away. Get medical help right away. Call your local emergency services (911 in the [place] Do not drive yourself to the hospital. Summary  Cellulitis is a skin infection. The area is often warm, red, swollen, and sore.  This condition is treated with medicines, rest, and cold and warm cloths.  Take all medicines only   as told by your doctor.  Tell your doctor if symptoms do not start to get better after 1-2 days of treatment. This information is not intended to replace advice given to you by your health care provider. Make sure you discuss any questions you have with your health care provider. Document Released: 02/12/2008 Document Revised: 01/15/2018 Document Reviewed: 01/15/2018 Elsevier Interactive Patient Education  2019 [place]. Acetaminophen; Hydrocodone tablets or capsules What is this medicine? ACETAMINOPHEN; HYDROCODONE (a set a MEE noe fen; hye droe KOE done) is a pain  reliever. It is used to treat moderate to severe pain. This medicine may be used for other purposes; ask your health care provider or pharmacist if you have questions. COMMON BRAND NAME(S): Anexsia, Bancap HC, Ceta-Plus, Co-Gesic, Comfortpak, Dolagesic, [employer], DuoCet, Hydrocet, Hydrogesic, [place], Lorcet HD, Lorcet Plus, Lortab, Margesic H, Maxidone, [place], Polygesic, [place], [place], [place], Vicodin, Vicodin ES, Vicodin HP, Charlane Ferretti What should I tell my health care provider before I take this medicine? They need to know if you have any of these conditions: -brain tumor -Crohn's disease, inflammatory bowel disease, or ulcerative colitis -drug abuse or addiction -head injury -heart or circulation problems -if you often drink alcohol -kidney disease or problems going to the bathroom -liver disease -lung disease, asthma, or breathing problems -an unusual or allergic reaction to acetaminophen, hydrocodone, other opioid analgesics, other medicines, foods, dyes, or preservatives -pregnant or trying to get pregnant -breast-feeding How should I use this medicine? Take this medicine by mouth with a glass of water. Follow the directions on the prescription label. You can take it with or without food. If it upsets your stomach, take it with food. Do not take your medicine more often than directed. A special MedGuide will be given to you by the pharmacist with each prescription and refill. Be sure to read this information carefully each time. Talk to your pediatrician regarding the use of this medicine in children. Special care may be needed. Overdosage: If you think you have taken too much of this medicine contact a poison control center or emergency room at once. NOTE: This medicine is only for you. Do not share this medicine with others. What if I miss a dose? If you miss a dose, take it as soon as you can. If it is almost time for your next dose, take only that dose. Do not take  double or extra doses. What may interact with this medicine? This medicine may interact with the following medications: -alcohol -antiviral medicines for HIV or AIDS -atropine -antihistamines for allergy, cough and cold -certain antibiotics like erythromycin, clarithromycin -certain medicines for anxiety or sleep -certain medicines for bladder problems like oxybutynin, tolterodine -certain medicines for depression like amitriptyline, fluoxetine, sertraline -certain medicines for fungal infections like ketoconazole and itraconazole -certain medicines for Parkinson's disease like benztropine, trihexyphenidyl -certain medicines for seizures like carbamazepine, phenobarbital, phenytoin, primidone -certain medicines for stomach problems like dicyclomine, hyoscyamine -certain medicines for travel sickness like scopolamine -general anesthetics like halothane, isoflurane, methoxyflurane, propofol -ipratropium -local anesthetics like lidocaine, pramoxine, tetracaine -MAOIs like Carbex, Eldepryl, Marplan, Nardil, and Parnate -medicines that relax muscles for surgery -other medicines with acetaminophen -other narcotic medicines for pain or cough -phenothiazines like chlorpromazine, mesoridazine, prochlorperazine, thioridazine -rifampin This list may not describe all possible interactions. Give your health care provider a list of all the medicines, herbs, non-prescription drugs, or dietary supplements you use. Also tell them if you smoke, drink alcohol, or use illegal drugs. Some items may interact with your  medicine. What should I watch for while using this medicine? Tell your doctor or health care professional if your pain does not go away, if it gets worse, or if you have new or a different type of pain. You may develop tolerance to the medicine. Tolerance means that you will need a higher dose of the medicine for pain relief. Tolerance is normal and is expected if you take the medicine for a long  time. Do not suddenly stop taking your medicine because you may develop a severe reaction. Your body becomes used to the medicine. This does NOT mean you are addicted. Addiction is a behavior related to getting and using a drug for a non-medical reason. If you have pain, you have a medical reason to take pain medicine. Your doctor will tell you how much medicine to take. If your doctor wants you to stop the medicine, the dose will be slowly lowered over time to avoid any side effects. There are different types of narcotic medicines (opiates). If you take more than one type at the same time or if you are taking another medicine that also causes drowsiness, you may have more side effects. Give your health care provider a list of all medicines you use. Your doctor will tell you how much medicine to take. Do not take more medicine than directed. Call emergency for help if you have problems breathing or unusual sleepiness. Do not take other medicines that contain acetaminophen with this medicine. Always read labels carefully. If you have questions, ask your doctor or pharmacist. If you take too much acetaminophen get medical help right away. Too much acetaminophen can be very dangerous and cause liver damage. Even if you do not have symptoms, it is important to get help right away. You may get drowsy or dizzy. Do not drive, use machinery, or do anything that needs mental alertness until you know how this medicine affects you. Do not stand or sit up quickly, especially if you are an older patient. This reduces the risk of dizzy or fainting spells. Alcohol may interfere with the effect of this medicine. Avoid alcoholic drinks. The medicine will cause constipation. Try to have a bowel movement at least every 2 to 3 days. If you do not have a bowel movement for 3 days, call your doctor or health care professional. Your mouth may get dry. Chewing sugarless gum or sucking hard candy, and drinking plenty of water may  help. Contact your doctor if the problem does not go away or is severe. What side effects may I notice from receiving this medicine? Side effects that you should report to your doctor or health care professional as soon as possible: -allergic reactions like skin rash, itching or hives, swelling of the face, lips, or tongue -breathing problems -confusion -redness, blistering, peeling or loosening of the skin, including inside the mouth -signs and symptoms of low blood pressure like dizziness; feeling faint or lightheaded, falls; unusually weak or tired -trouble passing urine or change in the amount of urine -yellowing of the eyes or skin Side effects that usually do not require medical attention (report to your doctor or health care professional if they continue or are bothersome): -constipation -dry mouth -nausea, vomiting -tiredness This list may not describe all possible side effects. Call your doctor for medical advice about side effects. You may report side effects to FDA at 1-800-FDA-1088. Where should I keep my medicine? Keep out of the reach of children. This medicine can be abused. Keep your  medicine in a safe place to protect it from theft. Do not share this medicine with anyone. Selling or giving away this medicine is dangerous and against the law. Store at room temperature between 15 and 30 degrees C (59 and 86 degrees F). This medicine may cause harm and death if it is taken by other adults, children, or pets. Return medicine that has not been used to an official disposal site. Contact the DEA at (717) 028-3953 or your city/county government to find a site. If you cannot return the medicine, flush it down the toilet. Do not use the medicine after the expiration date. NOTE: This sheet is a summary. It may not cover all possible information. If you have questions about this medicine, talk to your doctor, pharmacist, or health care provider.  2019 Elsevier/Gold Standard (2016-12-31  12:44:49)

## 2018-09-30 ENCOUNTER — Encounter: Payer: Self-pay | Admitting: Critical Care Medicine

## 2018-10-01 NOTE — Progress Notes (Signed)
This is a 48 year old female who was recently hospitalized at [facility] between 5 January and the 13th for cellulitis of the left upper extremity involving the elbow region.  The patient required debridement of the left elbow per hand surgery.  The olecranon bursa was also involved with the infection.  The patient has a previous history of alcohol use with alcohol withdrawal in the past.  She has had several admissions and emergency room visits for the same involving alcohol dependence and coagulopathy with pancytopenia  The patient actually had gone to the [facility] health patient care center to see nurse practitioner Strout earlier in the day.  This was a post hospital follow-up.  At that visit the patient's pain medication was altered to hydrocodone/acetaminophen 10/325 to take 1 every 8 hours as needed.  The patient also is receiving whirlpool debridement.  The patient stop by our clinic today to just confirm if her treatment was appropriate.  She was complaining of increased edema and pain in the arm and was questioning the treatment.  On exam I looked at the arm and took the Ace wrap off and it is well bandaged there is edema of the left hand I did not examine the wound itself and did not take the Kerlix wrap off the arm  In my opinion the patient is getting very appropriate treatment and I recommended she needs to follow-up with orthopedic hand surgery her appointment apparently is on 24 January.  I recommended that she potentially could use the opiate medication up to 4 times daily if needed  I also recommended she attempt to elevate the arm with extra pillows she will also request a sling when she goes to the orthopedic office  No other interventions were made at this brief encounter at the [facility] clinic

## 2018-10-07 ENCOUNTER — Telehealth: Payer: Self-pay

## 2018-10-07 ENCOUNTER — Other Ambulatory Visit: Payer: Self-pay | Admitting: Family Medicine

## 2018-10-07 DIAGNOSIS — R252 Cramp and spasm: Secondary | ICD-10-CM

## 2018-10-07 DIAGNOSIS — G4700 Insomnia, unspecified: Secondary | ICD-10-CM

## 2018-10-07 MED ORDER — HYDROXYZINE HCL 25 MG PO TABS: 25.0000 mg | ORAL_TABLET | Freq: Four times a day (QID) | ORAL | 0 refills | Status: DC | PRN

## 2018-10-07 MED ORDER — GABAPENTIN 100 MG PO CAPS: 200.0000 mg | ORAL_CAPSULE | Freq: Three times a day (TID) | ORAL | 3 refills | Status: DC

## 2018-10-07 MED FILL — hydrOXYzine HCL 25 MG TABS: 25 | 30 days supply | Qty: 90 | Fill #1

## 2018-10-07 MED FILL — GABAPENTIN 100 MG CAPSULE: 100 | 30 days supply | Qty: 180 | Fill #0

## 2018-10-08 ENCOUNTER — Other Ambulatory Visit: Payer: Self-pay

## 2018-10-08 DIAGNOSIS — G4700 Insomnia, unspecified: Secondary | ICD-10-CM

## 2018-10-08 DIAGNOSIS — R252 Cramp and spasm: Secondary | ICD-10-CM

## 2018-10-08 MED ORDER — TRAZODONE HCL 50 MG PO TABS: 50.0000 mg | ORAL_TABLET | Freq: Every evening | ORAL | 3 refills | Status: DC | PRN

## 2018-10-08 MED ORDER — GABAPENTIN 100 MG PO CAPS: 200.0000 mg | ORAL_CAPSULE | Freq: Three times a day (TID) | ORAL | 3 refills | Status: DC

## 2018-10-08 MED ORDER — FLUOXETINE HCL 20 MG PO CAPS: 20.0000 mg | ORAL_CAPSULE | Freq: Every day | ORAL | 0 refills | Status: DC

## 2018-10-08 MED FILL — FLUoxetine HCL 20 MG CAPS: 20 | 30 days supply | Qty: 30 | Fill #0

## 2018-10-08 MED FILL — traZODone HCL 50 MG TABS: 50 | 30 days supply | Qty: 30 | Fill #0

## 2018-10-08 MED FILL — GABAPENTIN 100 MG CAPSULE: 100 | 30 days supply | Qty: 180 | Fill #0

## 2018-10-08 NOTE — Telephone Encounter (Signed)
Medication sent to pharmacy  

## 2018-10-20 ENCOUNTER — Other Ambulatory Visit: Payer: Self-pay

## 2018-10-20 ENCOUNTER — Telehealth: Payer: Self-pay

## 2018-10-20 DIAGNOSIS — F419 Anxiety disorder, unspecified: Secondary | ICD-10-CM

## 2018-10-20 MED ORDER — TOPIRAMATE 50 MG PO TABS: 50.0000 mg | ORAL_TABLET | Freq: Every day | ORAL | 2 refills | Status: DC

## 2018-10-20 MED ORDER — BUSPIRONE HCL 10 MG PO TABS: 10.0000 mg | ORAL_TABLET | Freq: Two times a day (BID) | ORAL | 2 refills | Status: DC

## 2018-10-20 MED ORDER — PHENYTOIN SODIUM EXTENDED 300 MG PO CAPS: 300.0000 mg | ORAL_CAPSULE | Freq: Every day | ORAL | 2 refills | Status: DC

## 2018-10-20 MED FILL — FLUoxetine HCL 20 MG CAPS: 20 | 30 days supply | Qty: 30 | Fill #0

## 2018-10-20 MED FILL — PHENYTOIN SOD EXT 300 MG CA: 300 | 30 days supply | Qty: 30 | Fill #0

## 2018-10-20 MED FILL — TOPIRAMATE 50 MG TABLET: 50 | 30 days supply | Qty: 30 | Fill #0

## 2018-10-20 MED FILL — busPIRone HCL 10 MG TABS: 10 | 15 days supply | Qty: 30 | Fill #0

## 2018-10-20 MED FILL — traZODone HCL 50 MG TABS: 50 | 30 days supply | Qty: 30 | Fill #0

## 2018-10-20 NOTE — Telephone Encounter (Signed)
Medication sent.

## 2018-10-20 NOTE — Telephone Encounter (Signed)
Patient states that she has been having to take more Gabapentin than prescribed for her arm and she is out and would like to know if we can increase the dose

## 2018-10-22 ENCOUNTER — Other Ambulatory Visit: Payer: Self-pay | Admitting: Family Medicine

## 2018-10-22 DIAGNOSIS — G629 Polyneuropathy, unspecified: Secondary | ICD-10-CM

## 2018-10-22 MED ORDER — GABAPENTIN 300 MG PO CAPS: ORAL_CAPSULE | ORAL | 3 refills | Status: DC

## 2018-10-22 NOTE — Telephone Encounter (Signed)
Left a vm for patient to callback 

## 2018-10-22 NOTE — Telephone Encounter (Signed)
Patient notified

## 2018-10-23 ENCOUNTER — Encounter: Payer: Self-pay | Admitting: Family Medicine

## 2018-10-23 ENCOUNTER — Ambulatory Visit (INDEPENDENT_AMBULATORY_CARE_PROVIDER_SITE_OTHER): Payer: PRIVATE HEALTH INSURANCE | Admitting: Family Medicine

## 2018-10-23 VITALS — BP 100/74 | HR 74 | Temp 97.7°F | Ht 62.0 in | Wt 134.6 lb

## 2018-10-23 DIAGNOSIS — F419 Anxiety disorder, unspecified: Secondary | ICD-10-CM

## 2018-10-23 DIAGNOSIS — L03114 Cellulitis of left upper limb: Secondary | ICD-10-CM

## 2018-10-23 DIAGNOSIS — F322 Major depressive disorder, single episode, severe without psychotic features: Secondary | ICD-10-CM | POA: Diagnosis not present

## 2018-10-23 DIAGNOSIS — G629 Polyneuropathy, unspecified: Secondary | ICD-10-CM

## 2018-10-23 DIAGNOSIS — Z09 Encounter for follow-up examination after completed treatment for conditions other than malignant neoplasm: Secondary | ICD-10-CM | POA: Diagnosis not present

## 2018-10-23 LAB — POCT URINALYSIS DIP (MANUAL ENTRY)
Bilirubin, UA: NEGATIVE
Blood, UA: NEGATIVE
Glucose, UA: NEGATIVE mg/dL
Nitrite, UA: NEGATIVE
Spec Grav, UA: 1.025 (ref 1.010–1.025)
Urobilinogen, UA: 1 E.U./dL
pH, UA: 5.5 (ref 5.0–8.0)

## 2018-10-23 MED ORDER — FLUOXETINE HCL 40 MG PO CAPS: 40.0000 mg | ORAL_CAPSULE | Freq: Every day | ORAL | 3 refills | Status: DC

## 2018-10-23 NOTE — Progress Notes (Signed)
Patient [facility]  Established Patient Office Visit  Subjective:  Patient ID: Briana Sweeney, female    DOB: [birth year]  Age: 48 y.o. MRN: [id number]  CC:  Chief Complaint  Patient presents with  . Follow-up    Chronic condition     HPI Briana Sweeney is a 48 year old female who presents for follow up today.   Past Medical History:  Diagnosis Date  . Anxiety   . Bipolar affective disorder ([place])    With anxiety features  . 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 Depression   . ETOHism ([place])   . GERD (gastroesophageal reflux disease)   . Hematemesis 02/10/2018  . Hepatitis C 2018   hepatitis c and alcohol related hepatitis  . Heroin abuse ([place])   . History of blood transfusion    "blood doesn't clot; I fell down and had to have a transfusion"  . History of kidney stones   . Migraine    "when I get really stressed" (09/01/2017)  . Schizophrenia ([place])   . Seizures ([place])    "when I run out of my RX; lots recently" (09/01/2017)   Current Status: Since her last office visit, she is doing well with no complaints.  She is currently staying with a friend. She is receiving her disability and is awaiting her apartment. Her left forearm wound has healed and she no longer needs wound care. Her anxiety is mild today. She is requesting a refill on her Prozac today. She denies suicidal ideations, homicidal ideations, or auditory hallucinations.  She denies fevers, chills, fatigue, recent infections, weight loss, and night sweats. She has not had any headaches, visual changes, dizziness, and falls. No chest pain, heart palpitations, cough and shortness of breath reported. No reports of GI problems such as nausea, vomiting, diarrhea, and constipation. She has no reports of blood in stools, dysuria and hematuria. She denies pain today.   Past Surgical History:  Procedure Laterality Date  . ESOPHAGOGASTRODUODENOSCOPY N/A  09/03/2017   Procedure: ESOPHAGOGASTRODUODENOSCOPY (EGD);  Surgeon: Doran Stabler, MD;  Location: [facility];  Service: Gastroenterology;  Laterality: N/A;  . FINGER FRACTURE SURGERY Left    "shattered my pinky"  . FRACTURE SURGERY    . I&D EXTREMITY Left 09/18/2018   Procedure: IRRIGATION AND DEBRIDEMENT EXTREMITY;  Surgeon: Leanora Cover, MD;  Location: [facility];  Service: Orthopedics;  Laterality: Left;  . IR PARACENTESIS  07/23/2017  . IR PARACENTESIS  07/2017   "did it twice in the same week" (09/01/2017)  . SHOULDER OPEN ROTATOR CUFF REPAIR Right   . TUBAL LIGATION    . VAGINAL HYSTERECTOMY      Family History  Problem Relation Age of Onset  . Lung cancer Mother 46  . Alcohol abuse Mother   . Throat cancer Father 75    Social History   Socioeconomic History  . Marital status: Divorced    Spouse name: Not on file  . Number of children: Not on file  . Years of education: Not on file  . Highest education level: Not on file  Occupational History  . Occupation: applying for disability  Social Needs  . Financial resource strain: Not on file  . Food insecurity:    Worry: Not on file    Inability: Not on file  . Transportation needs:    Medical: Not on file    Non-medical: Not on file  Tobacco  Use  . Smoking status: Never Smoker  . Smokeless tobacco: Never Used  Substance and Sexual Activity  . Alcohol use: Yes    Alcohol/week: 63.0 standard drinks    Types: 63 Cans of beer per week    Comment: weekly "I have cut back"  . Drug use: Yes    Types: Marijuana  . Sexual activity: Not Currently  Lifestyle  . Physical activity:    Days per week: Not on file    Minutes per session: Not on file  . Stress: Not on file  Relationships  . Social connections:    Talks on phone: Not on file    Gets together: Not on file    Attends religious service: Not on file    Active member of club or organization: Not on file    Attends meetings of clubs or organizations: Not on  file    Relationship status: Not on file  . Intimate partner violence:    Fear of current or ex partner: Not on file    Emotionally abused: Not on file    Physically abused: Not on file    Forced sexual activity: Not on file  Other Topics Concern  . Not on file  Social History Narrative   She moved with a boyfriend to [place] and was followed at [facility].  He died of a massive heart attack in 8/18, per her report, and so she moved back to [place] and is living with a friend.    Outpatient Medications Prior to Visit  Medication Sig Dispense Refill  . busPIRone (BUSPAR) 10 MG tablet Take 1 tablet (10 mg total) by mouth 2 (two) times daily. 30 tablet 2  . famotidine (PEPCID) 20 MG tablet Take 1 tablet (20 mg total) by mouth daily. 30 tablet 0  . folic acid (FOLVITE) 1 MG tablet Take 1 tablet (1 mg total) by mouth daily. 30 tablet 0  . furosemide (LASIX) 20 MG tablet Take 1 tablet (20 mg total) by mouth daily. 30 tablet 0  . gabapentin (NEURONTIN) 300 MG capsule Take 2 capsules 2 times a day. 120 capsule 3  . hydrOXYzine (ATARAX/VISTARIL) 25 MG tablet Take 1 tablet (25 mg total) by mouth every 6 (six) hours as needed for itching. 20 tablet 0  . lactulose (CHRONULAC) 10 GM/15ML solution Take 30 mLs (20 g total) by mouth daily. 240 mL 3  . Multiple Vitamin (MULTIVITAMIN WITH MINERALS) TABS tablet Take 1 tablet by mouth daily.    . phenytoin (DILANTIN) 300 MG ER capsule Take 1 capsule (300 mg total) by mouth daily. 30 capsule 2  . spironolactone (ALDACTONE) 50 MG tablet Take 1 tablet (50 mg total) by mouth daily. 30 tablet 0  . thiamine 100 MG tablet Take 1 tablet (100 mg total) by mouth daily. 30 tablet 0  . topiramate (TOPAMAX) 50 MG tablet Take 1 tablet (50 mg total) by mouth daily. 30 tablet 2  . traZODone (DESYREL) 50 MG tablet Take 1 tablet (50 mg total) by mouth at bedtime as needed for sleep. 30 tablet 3  . FLUoxetine (PROZAC) 20 MG capsule Take 1 capsule (20 mg total) by  mouth daily. 30 capsule 0  . HYDROcodone-acetaminophen (NORCO) 10-325 MG tablet Take 1 tablet by mouth every 8 (eight) hours as needed. (Patient not taking: Reported on 10/23/2018) 30 tablet 0   No facility-administered medications prior to visit.     No Known Allergies  ROS Review of Systems  Constitutional: Negative.  HENT: Negative.   Eyes: Negative.   Respiratory: Negative.   Cardiovascular: Negative.   Gastrointestinal: Negative.   Endocrine: Negative.   Genitourinary: Negative.   Musculoskeletal: Negative.   Skin: Negative.   Allergic/Immunologic: Negative.   Neurological: Negative.   Hematological: Negative.   Psychiatric/Behavioral:       Mild anxiety   Objective:    Physical Exam  Constitutional: She is oriented to person, place, and time. She appears well-developed and well-nourished.  HENT:  Head: Normocephalic and atraumatic.  Eyes: Conjunctivae are normal.  Neck: Normal range of motion. Neck supple.  Cardiovascular: Normal rate, regular rhythm, normal heart sounds and intact distal pulses.  Pulmonary/Chest: Effort normal and breath sounds normal.  Abdominal: Soft. Bowel sounds are normal.  Musculoskeletal: Normal range of motion.  Neurological: She is alert and oriented to person, place, and time. She has normal reflexes.  Skin: Skin is warm and dry.  Psychiatric: She has a normal mood and affect. Her behavior is normal. Judgment and thought content normal.  Nursing note and vitals reviewed.   BP 100/74 (BP Location: Left Arm, Patient Position: Sitting, Cuff Size: Small)   Pulse 74   Temp 97.7 F (36.5 C) (Oral)   Ht 5\' 2"  (1.575 m)   Wt 134 lb 9.6 oz (61.1 kg)   SpO2 96%   BMI 24.62 kg/m  Wt Readings from Last 3 Encounters:  10/23/18 134 lb 9.6 oz (61.1 kg)  09/29/18 136 lb (61.7 kg)  09/20/18 143 lb 15.4 oz (65.3 kg)     Health Maintenance Due  Topic Date Due  . TETANUS/TDAP  06/10/1991    There are no preventive care reminders to  display for this patient.  Lab Results  Component Value Date   TSH 1.480 05/20/2018   Lab Results  Component Value Date   WBC 3.8 (L) 09/21/2018   HGB 12.0 09/21/2018   HCT 38.6 09/21/2018   MCV 93.2 09/21/2018   PLT 62 (L) 09/21/2018   Lab Results  Component Value Date   NA 136 09/21/2018   K 3.9 09/21/2018   CO2 26 09/21/2018   GLUCOSE 103 (H) 09/21/2018   BUN 5 (L) 09/21/2018   CREATININE 0.63 09/21/2018   BILITOT 2.0 (H) 09/13/2018   ALKPHOS 89 09/13/2018   AST 31 09/13/2018   ALT 27 09/13/2018   PROT 7.6 09/13/2018   ALBUMIN 3.1 (L) 09/13/2018   CALCIUM 8.6 (L) 09/21/2018   ANIONGAP 8 09/21/2018   Lab Results  Component Value Date   CHOL 164 05/20/2018   Lab Results  Component Value Date   HDL 71 05/20/2018   Lab Results  Component Value Date   LDLCALC 63 05/20/2018   Lab Results  Component Value Date   TRIG 149 05/20/2018   Lab Results  Component Value Date   CHOLHDL 2.3 05/20/2018   Lab Results  Component Value Date   HGBA1C 4.9 10/21/2017   Assessment & Plan:   1. MDD (major depressive disorder), severe ([place]) Stable today. She continues Prozac as needed. We will refer her to [facility] today.   2. Anxiety Increased Prozac to 40 mg.  - FLUoxetine (PROZAC) 40 MG capsule; Take 1 capsule (40 mg total) by mouth daily.  Dispense: 30 capsule; Refill: 3  3. Cellulitis of left forearm Resolved.   4. Neuropathy Stable today. Not worsening. She will   5. Follow up She will follow up in 2 months.  - POCT urinalysis dipstick  Meds ordered this encounter  Medications  . FLUoxetine (PROZAC) 40 MG capsule    Sig: Take 1 capsule (40 mg total) by mouth daily.    Dispense:  30 capsule    Refill:  3   Orders Placed This Encounter  Procedures  . Ambulatory referral to Psychiatry  . POCT urinalysis dipstick     Referral Orders     Ambulatory referral to Psychiatry Kathe Becton,  MSN, FNP-C Patient [address] [place], [place] [address] [phone]   Problem List Items Addressed This Visit      Other   Cellulitis of left forearm   MDD (major depressive disorder), severe ([place]) - Primary   Relevant Medications   FLUoxetine (PROZAC) 40 MG capsule   Other Relevant Orders   Ambulatory referral to Psychiatry    Other Visit Diagnoses    Anxiety       Relevant Medications   FLUoxetine (PROZAC) 40 MG capsule   Other Relevant Orders   Ambulatory referral to Psychiatry   Neuropathy       Follow up       Relevant Orders   POCT urinalysis dipstick      Meds ordered this encounter  Medications  . FLUoxetine (PROZAC) 40 MG capsule    Sig: Take 1 capsule (40 mg total) by mouth daily.    Dispense:  30 capsule    Refill:  3    Follow-up: Return in about 2 months (around 12/22/2018).    [name], FNP

## 2018-10-26 NOTE — Telephone Encounter (Signed)
Patient notified that referral was sent to [facility].

## 2018-10-26 NOTE — Telephone Encounter (Signed)
-----   Message from [name], [place] sent at 10/23/2018 12:10 PM EST ----- Regarding: "Psych Referral" Please inform patient that we have sent referral to [facility] today.

## 2018-10-27 DIAGNOSIS — F413 Other mixed anxiety disorders: Secondary | ICD-10-CM | POA: Insufficient documentation

## 2018-10-27 DIAGNOSIS — F32A Depression, unspecified: Secondary | ICD-10-CM | POA: Insufficient documentation

## 2018-10-27 DIAGNOSIS — G629 Polyneuropathy, unspecified: Secondary | ICD-10-CM | POA: Insufficient documentation

## 2018-10-27 DIAGNOSIS — F419 Anxiety disorder, unspecified: Secondary | ICD-10-CM | POA: Insufficient documentation

## 2018-10-27 HISTORY — DX: Other mixed anxiety disorders: F41.3

## 2018-10-27 HISTORY — DX: Polyneuropathy, unspecified: G62.9

## 2018-10-29 ENCOUNTER — Other Ambulatory Visit: Payer: Self-pay | Admitting: Family Medicine

## 2018-10-29 DIAGNOSIS — N390 Urinary tract infection, site not specified: Secondary | ICD-10-CM

## 2018-10-29 MED ORDER — SULFAMETHOXAZOLE-TRIMETHOPRIM 800-160 MG PO TABS: 1.0000 | ORAL_TABLET | Freq: Two times a day (BID) | ORAL | 0 refills | Status: DC

## 2018-11-05 MED FILL — GABAPENTIN 100 MG CAP: 100 | 30 days supply | Qty: 180 | Fill #0

## 2018-11-12 ENCOUNTER — Encounter (HOSPITAL_COMMUNITY): Payer: Self-pay | Admitting: Emergency Medicine

## 2018-11-12 ENCOUNTER — Emergency Department (HOSPITAL_COMMUNITY)
Admission: EM | Admit: 2018-11-12 | Discharge: 2018-11-13 | Disposition: A | Discharge status: Home / Self Care | Payer: PRIVATE HEALTH INSURANCE | Attending: Emergency Medicine | Admitting: Emergency Medicine

## 2018-11-12 ENCOUNTER — Ambulatory Visit (HOSPITAL_COMMUNITY): Payer: Self-pay | Admitting: Psychiatry

## 2018-11-12 ENCOUNTER — Other Ambulatory Visit: Payer: Self-pay

## 2018-11-12 DIAGNOSIS — Y908 Blood alcohol level of 240 mg/100 ml or more: Secondary | ICD-10-CM | POA: Diagnosis not present

## 2018-11-12 DIAGNOSIS — D696 Thrombocytopenia, unspecified: Secondary | ICD-10-CM

## 2018-11-12 DIAGNOSIS — D72819 Decreased white blood cell count, unspecified: Secondary | ICD-10-CM | POA: Diagnosis not present

## 2018-11-12 DIAGNOSIS — R748 Abnormal levels of other serum enzymes: Secondary | ICD-10-CM

## 2018-11-12 DIAGNOSIS — Z79899 Other long term (current) drug therapy: Secondary | ICD-10-CM | POA: Insufficient documentation

## 2018-11-12 DIAGNOSIS — F10920 Alcohol use, unspecified with intoxication, uncomplicated: Secondary | ICD-10-CM | POA: Diagnosis not present

## 2018-11-12 LAB — CBC WITH DIFFERENTIAL/PLATELET
Abs Immature Granulocytes: 0 10*3/uL (ref 0.00–0.07)
Basophils Absolute: 0 10*3/uL (ref 0.0–0.1)
Basophils Relative: 1 %
Eosinophils Absolute: 0.1 10*3/uL (ref 0.0–0.5)
Eosinophils Relative: 3 %
HCT: 46.9 % — ABNORMAL HIGH (ref 36.0–46.0)
Hemoglobin: 14.6 g/dL (ref 12.0–15.0)
Immature Granulocytes: 0 %
Lymphocytes Relative: 51 %
Lymphs Abs: 1.6 10*3/uL (ref 0.7–4.0)
MCH: 29.4 pg (ref 26.0–34.0)
MCHC: 31.1 g/dL (ref 30.0–36.0)
MCV: 94.6 fL (ref 80.0–100.0)
Monocytes Absolute: 0.3 10*3/uL (ref 0.1–1.0)
Monocytes Relative: 11 %
Neutro Abs: 1.1 10*3/uL — ABNORMAL LOW (ref 1.7–7.7)
Neutrophils Relative %: 34 %
Platelets: 41 10*3/uL — ABNORMAL LOW (ref 150–400)
RBC: 4.96 MIL/uL (ref 3.87–5.11)
RDW: 20.1 % — ABNORMAL HIGH (ref 11.5–15.5)
WBC: 3.2 10*3/uL — ABNORMAL LOW (ref 4.0–10.5)
nRBC: 0 % (ref 0.0–0.2)

## 2018-11-12 LAB — ETHANOL: Alcohol, Ethyl (B): 372 mg/dL (ref <10)

## 2018-11-12 LAB — SALICYLATE LEVEL: Salicylate Lvl: <7 mg/dL (ref 2.8–30.0)

## 2018-11-12 LAB — COMPREHENSIVE METABOLIC PANEL
ALT: 39 U/L (ref 0–44)
AST: 99 U/L — ABNORMAL HIGH (ref 15–41)
Albumin: 3.7 g/dL (ref 3.5–5.0)
Alkaline Phosphatase: 177 U/L — ABNORMAL HIGH (ref 38–126)
Anion gap: 12 (ref 5–15)
BUN: 7 mg/dL (ref 6–20)
CO2: 22 mmol/L (ref 22–32)
Calcium: 8.3 mg/dL — ABNORMAL LOW (ref 8.9–10.3)
Chloride: 107 mmol/L (ref 98–111)
Creatinine, Ser: 0.66 mg/dL (ref 0.44–1.00)
GFR calc Af Amer: >60 mL/min (ref >60)
GFR calc non Af Amer: >60 mL/min (ref >60)
Glucose, Bld: 99 mg/dL (ref 70–99)
Potassium: 4.3 mmol/L (ref 3.5–5.1)
Sodium: 141 mmol/L (ref 135–145)
Total Bilirubin: 0.9 mg/dL (ref 0.3–1.2)
Total Protein: 8.6 g/dL — ABNORMAL HIGH (ref 6.5–8.1)

## 2018-11-12 LAB — PHENYTOIN LEVEL, TOTAL: Phenytoin Lvl: <2.5 ug/mL — ABNORMAL LOW (ref 10.0–20.0)

## 2018-11-12 LAB — LIPASE, BLOOD: Lipase: 69 U/L — ABNORMAL HIGH (ref 11–51)

## 2018-11-12 LAB — ACETAMINOPHEN LEVEL: Acetaminophen (Tylenol), Serum: <10 ug/mL — ABNORMAL LOW (ref 10–30)

## 2018-11-12 MED ORDER — PHENYTOIN SODIUM EXTENDED 100 MG PO CAPS
300.0000 mg | ORAL_CAPSULE | Freq: Once | ORAL | Status: AC
  Administered 2018-11-12: 300 mg via ORAL
  Filled 2018-11-12: qty 3

## 2018-11-12 MED ORDER — THIAMINE HCL 100 MG/ML IJ SOLN
Freq: Once | INTRAVENOUS | Status: AC
  Administered 2018-11-12: 22:00:00 via INTRAVENOUS
  Filled 2018-11-12: qty 1000

## 2018-11-12 NOTE — ED Notes (Signed)
Patient made aware that urine sample is needed.  

## 2018-11-12 NOTE — ED Provider Notes (Addendum)
[facility] DEPT Provider Note   CSN: [id number] Arrival date & time: 11/12/18  1928    History   Chief Complaint Chief Complaint  Patient presents with  . Abdominal Pain  . Alcohol Intoxication    HPI Briana Sweeney is a 48 y.o. female.     48 year old female with prior medical history as detailed below presents for evaluation of abdominal pain.  Patient's reported abdominal pain appears to be chronic in nature.  Patient does appear to be intoxicated.  She reports that her last alcohol consumption was earlier today.  She is reportedly homeless and has been staying in the shelter.  She admits that she has not taken her Dilantin several days.  She denies recent vomiting.  She denies hematemesis or blood in her stool.  She denies fever.  The history is provided by the patient and medical records.  Abdominal Pain  Pain location:  Generalized Pain quality: aching   Pain radiates to:  Does not radiate Pain severity:  Mild Onset quality:  Unable to specify Timing:  Constant Progression:  Waxing and waning Context: alcohol use   Relieved by:  Nothing Worsened by:  Nothing Alcohol Intoxication  Associated symptoms include abdominal pain.    Past Medical History:  Diagnosis Date  . Anxiety   . Bipolar affective disorder ([place])    With anxiety features  . 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 Depression   . ETOHism ([place])   . GERD (gastroesophageal reflux disease)   . Hematemesis 02/10/2018  . Hepatitis C 2018   hepatitis c and alcohol related hepatitis  . Heroin abuse ([place])   . History of blood transfusion    "blood doesn't clot; I fell down and had to have a transfusion"  . History of kidney stones   . Migraine    "when I get really stressed" (09/01/2017)  . Schizophrenia ([place])   . Seizures ([place])    "when I run out of my RX; lots recently" (09/01/2017)    Patient Active Problem List   Diagnosis Date Noted  .  Anxiety 10/27/2018  . Neuropathy 10/27/2018  . Olecranon bursitis of left elbow   . Erysipelas 09/13/2018  . Acute metabolic encephalopathy 29/92/4268  . Overdose 08/22/2018  . Hypotension 08/22/2018  . Seizure ([place]) 08/22/2018  . Upper GI bleed 02/10/2018  . Alcohol withdrawal, with unspecified complication ([place]) 34/19/6222  . Chronic anemia   . Thrombocytopenia due to drugs   . Suicidal ideation   . Thrombocytopenia ([place]) 01/02/2018  . GERD (gastroesophageal reflux disease) 11/01/2017  . Trichimoniasis 10/30/2017  . Alcohol dependence ([place]) 10/29/2017  . MDD (major depressive disorder), severe ([place]) 10/28/2017  . Acute hyperactive alcohol withdrawal delirium ([place]) 10/09/2017  . Schizophrenia ([place]) 10/09/2017  . Alcohol abuse with alcohol-induced mood disorder ([place])   . Alcohol withdrawal syndrome with complication ([place]) 97/98/9211  . Esophageal varices without bleeding ([place])   . Hematemesis 09/01/2017  . Ascites due to alcoholic cirrhosis ([place])   . Decompensated hepatic cirrhosis ([place]) 07/23/2017  . Alcohol abuse 07/23/2017  . Hepatitis C antibody positive in blood 07/23/2017  . Hypokalemia 07/23/2017  . Jaundice 07/23/2017  . Coagulopathy ([place]) 07/23/2017  . Hypomagnesemia 07/23/2017  . Pancytopenia ([place]) 07/23/2017  . Alcoholic cirrhosis of liver with ascites ([place]) 07/23/2017    Past Surgical History:  Procedure Laterality Date  . ESOPHAGOGASTRODUODENOSCOPY N/A 09/03/2017   Procedure: ESOPHAGOGASTRODUODENOSCOPY (EGD);  Surgeon: Doran Stabler,  MD;  Location: [facility];  Service: Gastroenterology;  Laterality: N/A;  . FINGER FRACTURE SURGERY Left    "shattered my pinky"  . FRACTURE SURGERY    . I&D EXTREMITY Left 09/18/2018   Procedure: IRRIGATION AND DEBRIDEMENT EXTREMITY;  Surgeon: Leanora Cover, MD;  [facility];  Service: Orthopedics;  Laterality: Left;  . IR PARACENTESIS  07/23/2017  . IR PARACENTESIS  07/2017   "did it twice in the same week"  (09/01/2017)  . SHOULDER OPEN ROTATOR CUFF REPAIR Right   . TUBAL LIGATION    . VAGINAL HYSTERECTOMY       OB History   No obstetric history on file.      Home Medications    Prior to Admission medications   Medication Sig Start Date End Date Taking? Authorizing Provider  busPIRone (BUSPAR) 10 MG tablet Take 1 tablet (10 mg total) by mouth 2 (two) times daily. 10/20/18  Yes [name], FNP  famotidine (PEPCID) 20 MG tablet Take 1 tablet (20 mg total) by mouth daily. 09/23/18  Yes Eugenie Filler, MD  FLUoxetine (PROZAC) 40 MG capsule Take 1 capsule (40 mg total) by mouth daily. 10/23/18  Yes [name], FNP  folic acid (FOLVITE) 1 MG tablet Take 1 tablet (1 mg total) by mouth daily. 08/26/18  Yes Hongalgi, Lenis Dickinson, MD  furosemide (LASIX) 20 MG tablet Take 1 tablet (20 mg total) by mouth daily. 09/22/18  Yes Eugenie Filler, MD  gabapentin (NEURONTIN) 300 MG capsule Take 2 capsules 2 times a day. 10/22/18  Yes [name], FNP  hydrOXYzine (ATARAX/VISTARIL) 25 MG tablet Take 1 tablet (25 mg total) by mouth every 6 (six) hours as needed for itching. 10/07/18  Yes [name], FNP  lactulose (CHRONULAC) 10 GM/15ML solution Take 30 mLs (20 g total) by mouth daily. 07/22/18  Yes [name], FNP  Multiple Vitamin (MULTIVITAMIN WITH MINERALS) TABS tablet Take 1 tablet by mouth daily. 08/26/18  Yes Hongalgi, Lenis Dickinson, MD  phenytoin (DILANTIN) 300 MG ER capsule Take 1 capsule (300 mg total) by mouth daily. 10/20/18  Yes [name], FNP  spironolactone (ALDACTONE) 50 MG tablet Take 1 tablet (50 mg total) by mouth daily. 09/22/18  Yes Eugenie Filler, MD  sulfamethoxazole-trimethoprim (BACTRIM DS,SEPTRA DS) 800-160 MG tablet Take 1 tablet by mouth 2 (two) times daily. 10/29/18  Yes [name], FNP  thiamine 100 MG tablet Take 1 tablet (100 mg total) by mouth daily. 08/26/18  Yes Hongalgi, Lenis Dickinson, MD  topiramate (TOPAMAX) 50 MG tablet Take 1 tablet (50  mg total) by mouth daily. 10/20/18  Yes [name], FNP  traZODone (DESYREL) 50 MG tablet Take 1 tablet (50 mg total) by mouth at bedtime as needed for sleep. 10/08/18  Yes [name], FNP    Family History Family History  Problem Relation Age of Onset  . Lung cancer Mother 24  . Alcohol abuse Mother   . Throat cancer Father 67    Social History Social History   Tobacco Use  . Smoking status: Never Smoker  . Smokeless tobacco: Never Used  Substance Use Topics  . Alcohol use: Yes    Alcohol/week: 63.0 standard drinks    Types: 63 Cans of beer per week    Comment: weekly "I have cut back"  . Drug use: Yes    Types: Marijuana     Allergies   Patient has no known allergies.   Review of  Systems Review of Systems  Gastrointestinal: Positive for abdominal pain.  All other systems reviewed and are negative.    Physical Exam Updated Vital Signs BP 101/69 (BP Location: Right Arm)   Pulse 78   Temp 98 F (36.7 C) (Oral)   Resp 17   SpO2 96%   Physical Exam Vitals signs and nursing note reviewed.  Constitutional:      General: She is not in acute distress.    Appearance: She is well-developed.  HENT:     Head: Normocephalic and atraumatic.  Eyes:     Conjunctiva/sclera: Conjunctivae normal.     Pupils: Pupils are equal, round, and reactive to light.  Neck:     Musculoskeletal: Normal range of motion and neck supple.  Cardiovascular:     Rate and Rhythm: Normal rate and regular rhythm.     Heart sounds: Normal heart sounds.  Pulmonary:     Effort: Pulmonary effort is normal. No respiratory distress.     Breath sounds: Normal breath sounds.  Abdominal:     General: There is no distension.     Palpations: Abdomen is soft.     Tenderness: There is generalized abdominal tenderness.  Musculoskeletal: Normal range of motion.        General: No deformity.  Skin:    General: Skin is warm and dry.  Neurological:     Mental Status: She is alert and  oriented to person, place, and time.      ED Treatments / Results  Labs (all labs ordered are listed, but only abnormal results are displayed) Labs Reviewed  COMPREHENSIVE METABOLIC PANEL - Abnormal; Notable for the following components:      Result Value   Calcium 8.3 (*)    Total Protein 8.6 (*)    AST 99 (*)    Alkaline Phosphatase 177 (*)    All other components within normal limits  ETHANOL - Abnormal; Notable for the following components:   Alcohol, Ethyl (B) 372 (*)    All other components within normal limits  CBC WITH DIFFERENTIAL/PLATELET - Abnormal; Notable for the following components:   WBC 3.2 (*)    HCT 46.9 (*)    RDW 20.1 (*)    Platelets 41 (*)    Neutro Abs 1.1 (*)    All other components within normal limits  LIPASE, BLOOD - Abnormal; Notable for the following components:   Lipase 69 (*)    All other components within normal limits  PHENYTOIN LEVEL, TOTAL - Abnormal; Notable for the following components:   Phenytoin Lvl <2.5 (*)    All other components within normal limits  ACETAMINOPHEN LEVEL - Abnormal; Notable for the following components:   Acetaminophen (Tylenol), Serum <10 (*)    All other components within normal limits  SALICYLATE LEVEL  RAPID URINE DRUG SCREEN, HOSP PERFORMED  URINALYSIS, ROUTINE W REFLEX MICROSCOPIC    EKG None  Radiology No results found.  Procedures Procedures (including critical care time)  Medications Ordered in ED Medications  phenytoin (DILANTIN) ER capsule 300 mg (has no administration in time range)  sodium chloride 0.9 % 1,000 mL with thiamine 161 mg, folic acid 1 mg, multivitamins adult 10 mL infusion ( Intravenous New Bag/Given 11/12/18 2214)     Initial Impression / Assessment and Plan / ED Course  I have reviewed the triage vital signs and the nursing notes.  Pertinent labs & imaging results that were available during my care of the patient were reviewed by me and  considered in my medical decision  making (see chart for details).        2230 Re-evaluation -patient feels improved.  Her abdominal exam is benign.  Patient remains intoxicated.  She reports that she was planning on staying in the shelter tonight but cannot go there since she is intoxicated.  She declines an IV dose of Dilantin.  She requests to stay in the ED until she is more sober.    MDM  Screen complete  Patient is presenting for evaluation of abdominal discomfort that appears to be chronic in nature.  She is clinically intoxicated upon arrival.  Screening labs did reveal elevated alcohol level at 372.  Additionally, her dilantin level is negligible.  Other labs obtained do not demonstrate acute pathology.   Given her level of intoxication and lack of place to stay to tonight, will plan on keeping her in the ED until she is more sober and appropriate for a safe discharge.   She was offered an IV dilantin load (which she declines).  Dr. Roxanne Mins aware of pending re-evaluation and dispo.    Final Clinical Impressions(s) / ED Diagnoses   Final diagnoses:  Alcoholic intoxication without complication [facility])    ED Discharge Orders    None       Valarie Merino, MD 11/12/18 2239    Valarie Merino, MD 11/13/18 [phone]

## 2018-11-12 NOTE — ED Triage Notes (Signed)
Patient c/o abdominal pain with N/V x1 week. Hx cirrhosis. Reports drinking beer today.

## 2018-11-12 NOTE — ED Notes (Signed)
Date and time results received: 11/12/18 9:02 PM  Test: ETOH Critical Value: 372  Name of Provider Notified: Messick MD   Orders Received? Or Actions Taken?: see orders for details; will continue to monitor patient.

## 2018-11-12 NOTE — ED Notes (Signed)
Patient ambulated to restroom with minimal assistance. ?

## 2018-11-12 NOTE — ED Notes (Signed)
Urine sample and urine culture sent to lab  

## 2018-11-13 DIAGNOSIS — F10920 Alcohol use, unspecified with intoxication, uncomplicated: Secondary | ICD-10-CM | POA: Diagnosis not present

## 2018-11-13 LAB — URINALYSIS, ROUTINE W REFLEX MICROSCOPIC
Bilirubin Urine: NEGATIVE
Glucose, UA: NEGATIVE mg/dL
Hgb urine dipstick: NEGATIVE
Ketones, ur: 5 mg/dL — AB
Leukocytes,Ua: NEGATIVE
Nitrite: NEGATIVE
Protein, ur: NEGATIVE mg/dL
Specific Gravity, Urine: 1.017 (ref 1.005–1.030)
pH: 5 (ref 5.0–8.0)

## 2018-11-13 LAB — RAPID URINE DRUG SCREEN, HOSP PERFORMED
Amphetamines: NOT DETECTED
Barbiturates: NOT DETECTED
Benzodiazepines: NOT DETECTED
Cocaine: NOT DETECTED
Opiates: NOT DETECTED
Tetrahydrocannabinol: NOT DETECTED

## 2018-11-13 MED ORDER — CHLORDIAZEPOXIDE HCL 25 MG PO CAPS: ORAL_CAPSULE | ORAL | 0 refills | Status: DC

## 2018-11-13 MED ORDER — LORAZEPAM 1 MG PO TABS
2.0000 mg | ORAL_TABLET | Freq: Once | ORAL | Status: AC
  Administered 2018-11-13: 2 mg via ORAL
  Filled 2018-11-13: qty 2

## 2018-11-13 MED ORDER — ONDANSETRON 8 MG PO TBDP
8.0000 mg | ORAL_TABLET | Freq: Once | ORAL | Status: AC
  Administered 2018-11-13: 8 mg via ORAL
  Filled 2018-11-13: qty 1

## 2018-11-13 MED ORDER — ACETAMINOPHEN 325 MG PO TABS
650.0000 mg | ORAL_TABLET | Freq: Once | ORAL | Status: AC
  Administered 2018-11-13: 650 mg via ORAL
  Filled 2018-11-13: qty 2

## 2018-11-13 MED FILL — busPIRone HCL 10 MG TABS: 10 | 15 days supply | Qty: 30 | Fill #1

## 2018-11-13 MED FILL — hydrOXYzine HCL 25 MG TABS: 25 | 30 days supply | Qty: 90 | Fill #2

## 2018-11-13 NOTE — ED Notes (Signed)
Patient ambulated to restroom with minimal assistance. ?

## 2018-11-13 NOTE — ED Provider Notes (Signed)
Care assumed from Dr. Francia Greaves, patient with alcohol intoxication.  She was observed overnight.  She did get somewhat anxious and did require a dose of lorazepam.  Labs also show leukopenia and thrombocytopenia which are probably related to alcohol induced cirrhosis.  Minimal elevation of lipase is not felt to be clinically significant.  She is discharged with a tapering dose of chlordiazepoxide.   Delora Fuel, MD 96/22/29 [phone]

## 2018-11-20 ENCOUNTER — Emergency Department (HOSPITAL_COMMUNITY): Payer: PRIVATE HEALTH INSURANCE

## 2018-11-20 ENCOUNTER — Other Ambulatory Visit: Payer: PRIVATE HEALTH INSURANCE

## 2018-11-20 ENCOUNTER — Inpatient Hospital Stay (HOSPITAL_COMMUNITY)
Admission: EM | Admit: 2018-11-20 | Discharge: 2018-11-24 | DRG: 813 | Discharge status: Against Medical Advice | Payer: PRIVATE HEALTH INSURANCE | Source: Ambulatory Visit | Attending: Internal Medicine | Admitting: Internal Medicine

## 2018-11-20 ENCOUNTER — Encounter (HOSPITAL_COMMUNITY): Payer: Self-pay | Admitting: *Deleted

## 2018-11-20 ENCOUNTER — Other Ambulatory Visit: Payer: Self-pay

## 2018-11-20 DIAGNOSIS — R51 Headache: Secondary | ICD-10-CM | POA: Diagnosis present

## 2018-11-20 DIAGNOSIS — F1290 Cannabis use, unspecified, uncomplicated: Secondary | ICD-10-CM | POA: Diagnosis present

## 2018-11-20 DIAGNOSIS — G43909 Migraine, unspecified, not intractable, without status migrainosus: Secondary | ICD-10-CM | POA: Diagnosis not present

## 2018-11-20 DIAGNOSIS — I8500 Esophageal varices without bleeding: Secondary | ICD-10-CM | POA: Diagnosis present

## 2018-11-20 DIAGNOSIS — F419 Anxiety disorder, unspecified: Secondary | ICD-10-CM | POA: Diagnosis present

## 2018-11-20 DIAGNOSIS — F10239 Alcohol dependence with withdrawal, unspecified: Secondary | ICD-10-CM | POA: Diagnosis present

## 2018-11-20 DIAGNOSIS — K921 Melena: Secondary | ICD-10-CM | POA: Diagnosis present

## 2018-11-20 DIAGNOSIS — F1490 Cocaine use, unspecified, uncomplicated: Secondary | ICD-10-CM | POA: Diagnosis present

## 2018-11-20 DIAGNOSIS — M25512 Pain in left shoulder: Secondary | ICD-10-CM | POA: Diagnosis present

## 2018-11-20 DIAGNOSIS — K922 Gastrointestinal hemorrhage, unspecified: Secondary | ICD-10-CM

## 2018-11-20 DIAGNOSIS — K920 Hematemesis: Secondary | ICD-10-CM | POA: Diagnosis present

## 2018-11-20 DIAGNOSIS — F209 Schizophrenia, unspecified: Secondary | ICD-10-CM

## 2018-11-20 DIAGNOSIS — F1010 Alcohol abuse, uncomplicated: Secondary | ICD-10-CM | POA: Diagnosis not present

## 2018-11-20 DIAGNOSIS — D6959 Other secondary thrombocytopenia: Secondary | ICD-10-CM | POA: Diagnosis not present

## 2018-11-20 DIAGNOSIS — Z8619 Personal history of other infectious and parasitic diseases: Secondary | ICD-10-CM | POA: Diagnosis not present

## 2018-11-20 DIAGNOSIS — F10220 Alcohol dependence with intoxication, uncomplicated: Secondary | ICD-10-CM | POA: Diagnosis present

## 2018-11-20 DIAGNOSIS — F10920 Alcohol use, unspecified with intoxication, uncomplicated: Secondary | ICD-10-CM

## 2018-11-20 DIAGNOSIS — R569 Unspecified convulsions: Secondary | ICD-10-CM | POA: Diagnosis present

## 2018-11-20 DIAGNOSIS — Z79899 Other long term (current) drug therapy: Secondary | ICD-10-CM | POA: Diagnosis not present

## 2018-11-20 DIAGNOSIS — D473 Essential (hemorrhagic) thrombocythemia: Secondary | ICD-10-CM | POA: Diagnosis not present

## 2018-11-20 DIAGNOSIS — D61818 Other pancytopenia: Secondary | ICD-10-CM | POA: Diagnosis present

## 2018-11-20 DIAGNOSIS — R300 Dysuria: Secondary | ICD-10-CM | POA: Diagnosis not present

## 2018-11-20 DIAGNOSIS — R4182 Altered mental status, unspecified: Secondary | ICD-10-CM

## 2018-11-20 DIAGNOSIS — D72819 Decreased white blood cell count, unspecified: Secondary | ICD-10-CM

## 2018-11-20 DIAGNOSIS — F329 Major depressive disorder, single episode, unspecified: Secondary | ICD-10-CM | POA: Diagnosis not present

## 2018-11-20 DIAGNOSIS — G40909 Epilepsy, unspecified, not intractable, without status epilepticus: Secondary | ICD-10-CM

## 2018-11-20 DIAGNOSIS — K7682 Hepatic encephalopathy: Secondary | ICD-10-CM

## 2018-11-20 DIAGNOSIS — R825 Elevated urine levels of drugs, medicaments and biological substances: Secondary | ICD-10-CM

## 2018-11-20 DIAGNOSIS — M545 Low back pain: Secondary | ICD-10-CM | POA: Diagnosis present

## 2018-11-20 DIAGNOSIS — F339 Major depressive disorder, recurrent, unspecified: Secondary | ICD-10-CM | POA: Diagnosis not present

## 2018-11-20 DIAGNOSIS — Z811 Family history of alcohol abuse and dependence: Secondary | ICD-10-CM | POA: Diagnosis not present

## 2018-11-20 DIAGNOSIS — Y908 Blood alcohol level of 240 mg/100 ml or more: Secondary | ICD-10-CM

## 2018-11-20 DIAGNOSIS — Z801 Family history of malignant neoplasm of trachea, bronchus and lung: Secondary | ICD-10-CM | POA: Diagnosis not present

## 2018-11-20 DIAGNOSIS — D696 Thrombocytopenia, unspecified: Secondary | ICD-10-CM | POA: Diagnosis present

## 2018-11-20 DIAGNOSIS — G8929 Other chronic pain: Secondary | ICD-10-CM | POA: Diagnosis present

## 2018-11-20 DIAGNOSIS — R070 Pain in throat: Secondary | ICD-10-CM | POA: Diagnosis not present

## 2018-11-20 DIAGNOSIS — K766 Portal hypertension: Secondary | ICD-10-CM | POA: Diagnosis present

## 2018-11-20 DIAGNOSIS — F10129 Alcohol abuse with intoxication, unspecified: Secondary | ICD-10-CM | POA: Diagnosis not present

## 2018-11-20 DIAGNOSIS — M25511 Pain in right shoulder: Secondary | ICD-10-CM | POA: Diagnosis not present

## 2018-11-20 DIAGNOSIS — I8510 Secondary esophageal varices without bleeding: Secondary | ICD-10-CM

## 2018-11-20 DIAGNOSIS — K7030 Alcoholic cirrhosis of liver without ascites: Secondary | ICD-10-CM

## 2018-11-20 DIAGNOSIS — S5012XA Contusion of left forearm, initial encounter: Secondary | ICD-10-CM | POA: Diagnosis present

## 2018-11-20 DIAGNOSIS — K7290 Hepatic failure, unspecified without coma: Secondary | ICD-10-CM | POA: Diagnosis present

## 2018-11-20 DIAGNOSIS — R1033 Periumbilical pain: Secondary | ICD-10-CM | POA: Diagnosis not present

## 2018-11-20 HISTORY — DX: Unspecified cirrhosis of liver: K74.60

## 2018-11-20 HISTORY — DX: Unspecified cirrhosis of liver: I85.10

## 2018-11-20 LAB — CBC
HCT: 37.6 % (ref 36.0–46.0)
Hemoglobin: 12.1 g/dL (ref 12.0–15.0)
MCH: 29.6 pg (ref 26.0–34.0)
MCHC: 32.2 g/dL (ref 30.0–36.0)
MCV: 91.9 fL (ref 80.0–100.0)
Platelets: 12 10*3/uL — CL (ref 150–400)
RBC: 4.09 MIL/uL (ref 3.87–5.11)
RDW: 20.2 % — ABNORMAL HIGH (ref 11.5–15.5)
WBC: 2 10*3/uL — ABNORMAL LOW (ref 4.0–10.5)
nRBC: 0 % (ref 0.0–0.2)

## 2018-11-20 LAB — URINALYSIS, ROUTINE W REFLEX MICROSCOPIC
Bilirubin Urine: NEGATIVE
Glucose, UA: NEGATIVE mg/dL
Hgb urine dipstick: NEGATIVE
Ketones, ur: NEGATIVE mg/dL
Leukocytes,Ua: NEGATIVE
Nitrite: NEGATIVE
Protein, ur: NEGATIVE mg/dL
Specific Gravity, Urine: 1.003 — ABNORMAL LOW (ref 1.005–1.030)
pH: 6 (ref 5.0–8.0)

## 2018-11-20 LAB — RAPID URINE DRUG SCREEN, HOSP PERFORMED
Amphetamines: NOT DETECTED
Barbiturates: NOT DETECTED
Benzodiazepines: POSITIVE — AB
Cocaine: POSITIVE — AB
Opiates: NOT DETECTED
Tetrahydrocannabinol: NOT DETECTED

## 2018-11-20 LAB — PHENYTOIN LEVEL, TOTAL: Phenytoin Lvl: <2.5 ug/mL — ABNORMAL LOW (ref 10.0–20.0)

## 2018-11-20 LAB — TYPE AND SCREEN
ABO/RH(D): O POS
Antibody Screen: NEGATIVE

## 2018-11-20 LAB — COMPREHENSIVE METABOLIC PANEL
ALT: 24 U/L (ref 0–44)
AST: 53 U/L — ABNORMAL HIGH (ref 15–41)
Albumin: 3.2 g/dL — ABNORMAL LOW (ref 3.5–5.0)
Alkaline Phosphatase: 146 U/L — ABNORMAL HIGH (ref 38–126)
Anion gap: 8 (ref 5–15)
BUN: 5 mg/dL — ABNORMAL LOW (ref 6–20)
CO2: 22 mmol/L (ref 22–32)
Calcium: 8.4 mg/dL — ABNORMAL LOW (ref 8.9–10.3)
Chloride: 110 mmol/L (ref 98–111)
Creatinine, Ser: 0.55 mg/dL (ref 0.44–1.00)
GFR calc Af Amer: >60 mL/min (ref >60)
GFR calc non Af Amer: >60 mL/min (ref >60)
Glucose, Bld: 95 mg/dL (ref 70–99)
Potassium: 3.9 mmol/L (ref 3.5–5.1)
Sodium: 140 mmol/L (ref 135–145)
Total Bilirubin: 0.8 mg/dL (ref 0.3–1.2)
Total Protein: 8 g/dL (ref 6.5–8.1)

## 2018-11-20 LAB — CBC WITH DIFFERENTIAL/PLATELET
Abs Immature Granulocytes: 0 10*3/uL (ref 0.00–0.07)
Basophils Absolute: 0 10*3/uL (ref 0.0–0.1)
Basophils Relative: 0 %
Eosinophils Absolute: 0.1 10*3/uL (ref 0.0–0.5)
Eosinophils Relative: 5 %
HCT: 40.6 % (ref 36.0–46.0)
Hemoglobin: 13.3 g/dL (ref 12.0–15.0)
Immature Granulocytes: 0 %
Lymphocytes Relative: 46 %
Lymphs Abs: 1.2 10*3/uL (ref 0.7–4.0)
MCH: 30.4 pg (ref 26.0–34.0)
MCHC: 32.8 g/dL (ref 30.0–36.0)
MCV: 92.9 fL (ref 80.0–100.0)
Monocytes Absolute: 0.4 10*3/uL (ref 0.1–1.0)
Monocytes Relative: 14 %
Neutro Abs: 0.9 10*3/uL — ABNORMAL LOW (ref 1.7–7.7)
Neutrophils Relative %: 35 %
Platelets: 11 10*3/uL — CL (ref 150–400)
RBC: 4.37 MIL/uL (ref 3.87–5.11)
RDW: 20.1 % — ABNORMAL HIGH (ref 11.5–15.5)
WBC: 2.7 10*3/uL — ABNORMAL LOW (ref 4.0–10.5)
nRBC: 0 % (ref 0.0–0.2)

## 2018-11-20 LAB — LIPASE, BLOOD: Lipase: 41 U/L (ref 11–51)

## 2018-11-20 LAB — AMMONIA: Ammonia: 67 umol/L — ABNORMAL HIGH (ref 9–35)

## 2018-11-20 LAB — ETHANOL: Alcohol, Ethyl (B): 273 mg/dL — ABNORMAL HIGH (ref <10)

## 2018-11-20 LAB — POC OCCULT BLOOD, ED: Fecal Occult Bld: POSITIVE — AB

## 2018-11-20 MED ORDER — TOPIRAMATE 25 MG PO TABS
50.0000 mg | ORAL_TABLET | Freq: Every day | ORAL | Status: DC
  Administered 2018-11-21 – 2018-11-24 (×4): 50 mg via ORAL
  Filled 2018-11-20 (×4): qty 2

## 2018-11-20 MED ORDER — TRAZODONE HCL 50 MG PO TABS
50.0000 mg | ORAL_TABLET | Freq: Every evening | ORAL | Status: DC | PRN
  Administered 2018-11-23: 50 mg via ORAL
  Filled 2018-11-20: qty 1

## 2018-11-20 MED ORDER — HYDROXYZINE HCL 25 MG PO TABS: 25.0000 mg | ORAL_TABLET | Freq: Four times a day (QID) | ORAL | Status: DC | PRN

## 2018-11-20 MED ORDER — LACTULOSE 10 GM/15ML PO SOLN
30.0000 g | Freq: Once | ORAL | Status: DC
  Filled 2018-11-20: qty 45

## 2018-11-20 MED ORDER — SODIUM CHLORIDE 0.9 % IV BOLUS
1000.0000 mL | Freq: Once | INTRAVENOUS | Status: AC
  Administered 2018-11-20: 1000 mL via INTRAVENOUS

## 2018-11-20 MED ORDER — THIAMINE HCL 100 MG/ML IJ SOLN: 100.0000 mg | Freq: Every day | INTRAMUSCULAR | Status: DC

## 2018-11-20 MED ORDER — THIAMINE HCL 100 MG PO TABS: 100.0000 mg | ORAL_TABLET | Freq: Every day | ORAL | Status: DC

## 2018-11-20 MED ORDER — SODIUM CHLORIDE 0.9 % IV SOLN
80.0000 mg | Freq: Once | INTRAVENOUS | Status: AC
  Administered 2018-11-20: 80 mg via INTRAVENOUS
  Filled 2018-11-20: qty 80

## 2018-11-20 MED ORDER — VITAMIN B-1 100 MG PO TABS: 100.0000 mg | ORAL_TABLET | Freq: Every day | ORAL | Status: DC

## 2018-11-20 MED ORDER — LORAZEPAM 2 MG/ML IJ SOLN
2.0000 mg | INTRAMUSCULAR | Status: DC | PRN
  Administered 2018-11-20 – 2018-11-22 (×8): 2 mg via INTRAVENOUS
  Administered 2018-11-22: 3 mg via INTRAVENOUS
  Administered 2018-11-22 – 2018-11-24 (×14): 2 mg via INTRAVENOUS
  Filled 2018-11-20 (×19): qty 1
  Filled 2018-11-20: qty 2
  Filled 2018-11-20 (×3): qty 1

## 2018-11-20 MED ORDER — FOLIC ACID 1 MG PO TABS
1.0000 mg | ORAL_TABLET | Freq: Every day | ORAL | Status: DC
  Administered 2018-11-20 – 2018-11-24 (×5): 1 mg via ORAL
  Filled 2018-11-20 (×5): qty 1

## 2018-11-20 MED ORDER — BUSPIRONE HCL 5 MG PO TABS
10.0000 mg | ORAL_TABLET | Freq: Two times a day (BID) | ORAL | Status: DC
  Administered 2018-11-20 – 2018-11-24 (×8): 10 mg via ORAL
  Filled 2018-11-20 (×8): qty 2

## 2018-11-20 MED ORDER — PANTOPRAZOLE SODIUM 40 MG IV SOLR: 40.0000 mg | Freq: Two times a day (BID) | INTRAVENOUS | Status: DC

## 2018-11-20 MED ORDER — THIAMINE HCL 100 MG/ML IJ SOLN
100.0000 mg | Freq: Every day | INTRAMUSCULAR | Status: DC
  Administered 2018-11-20: 100 mg via INTRAVENOUS
  Filled 2018-11-20: qty 2

## 2018-11-20 MED ORDER — LACTULOSE 10 GM/15ML PO SOLN
20.0000 g | Freq: Every day | ORAL | Status: DC
  Administered 2018-11-20 – 2018-11-24 (×4): 20 g via ORAL
  Filled 2018-11-20 (×5): qty 30

## 2018-11-20 MED ORDER — PHENYTOIN SODIUM EXTENDED 100 MG PO CAPS
300.0000 mg | ORAL_CAPSULE | Freq: Every day | ORAL | Status: DC
  Administered 2018-11-21 – 2018-11-24 (×4): 300 mg via ORAL
  Filled 2018-11-20 (×4): qty 3

## 2018-11-20 MED ORDER — SODIUM CHLORIDE 0.9 % IV SOLN
8.0000 mg/h | INTRAVENOUS | Status: DC
  Administered 2018-11-20 – 2018-11-21 (×2): 8 mg/h via INTRAVENOUS
  Filled 2018-11-20 (×3): qty 80

## 2018-11-20 MED ORDER — VITAMIN B-1 100 MG PO TABS
100.0000 mg | ORAL_TABLET | Freq: Every day | ORAL | Status: DC
  Administered 2018-11-21 – 2018-11-24 (×4): 100 mg via ORAL
  Filled 2018-11-20 (×4): qty 1

## 2018-11-20 NOTE — Progress Notes (Signed)
Briana Sweeney is a 48 y.o. female patient admitted from ED awake, alert - oriented  X 4 - no acute distress noted.  VSS - Blood pressure 118/84, pulse 69, temperature 97.6 F (36.4 C), temperature source Oral, resp. rate 17, height 5' (1.524 m), weight 61.8 kg, SpO2 98 %.    IV in place, occlusive dsg intact without redness.  Orientation to room, and floor completed with information packet given to patient/family.  Admission INP armband ID verified with patient/family, and in place.  Fall assessment complete, with patient and family able to verbalize understanding of risk associated with falls, and verbalized understanding to call nsg before up out of bed.  Call light within reach, patient able to voice, and demonstrate understanding.  Skin, clean-dry- intact, bruise on left forearm. No evidence of skin break down noted on exam.     Will cont to eval and treat per MD orders.  Patience Musca, RN 11/20/2018 6:50 PM
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
## 2018-11-20 NOTE — ED Triage Notes (Signed)
Patient presents to ed via [facility] states seh was brought to the ED from PCP  C/o decreased platelet countc/o abd. Pain and bloating with vomiting coffee ground emsis states this has been going on for over a week. Admits to daily intake of 1/2 gallon vodka / day and cocaine , last used both yest.

## 2018-11-20 NOTE — ED Notes (Signed)
ED TO INPATIENT HANDOFF REPORT  ED Nurse Name and Phone #:  [address]  S Name/Age/Gender Briana Sweeney 48 y.o. female Room/Bed: 024C/024C  Code Status   Code Status: Full Code  Home/SNF/Other Home Patient oriented to: AOx4 Is this baseline? Yes   Triage Complete: Triage complete  Chief Complaint Gi bleed   Triage Note Patient presents to ed via [facility] states seh was brought to the ED from PCP  C/o decreased platelet countc/o abd. Pain and bloating with vomiting coffee ground emsis states this has been going on for over a week. Admits to daily intake of 1/2 gallon vodka / day and cocaine , last used both yest.     Allergies No Known Allergies  Level of Care/Admitting Diagnosis ED Disposition    ED Disposition Condition Comment   Admit  Hospital Area: [name] [[id number]]  Level of Care: Progressive [102]  Diagnosis: Thrombocytopenia [facility]) [[id number]]  Admitting Physician: Annia Belt [[id number]]  Attending Physician: Annia Belt [[id number]]  Estimated length of stay: past midnight tomorrow  Certification:: I certify this patient will need inpatient services for at least 2 midnights  PT Class (Do Not Modify): Inpatient [101]  PT Acc Code (Do Not Modify): Private [1]       B Medical/Surgery History Past Medical History:  Diagnosis Date  . Anxiety   . Bipolar affective disorder ([place])    With anxiety features  . Cirrhosis of liver ([place])    Due to alcohol and hepatitis C  . Depression   . 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 ETOHism ([place])   . GERD (gastroesophageal reflux disease)   . Hematemesis 02/10/2018  . Hepatitis C 2018   hepatitis c and alcohol related hepatitis  . Heroin abuse ([place])   . History of blood transfusion    "blood doesn't clot; I fell down and had to have a transfusion"  . History of kidney stones   . Migraine    "when I get really stressed" (09/01/2017)  . Schizophrenia ([place])   . Seizures ([place])    "when  I run out of my RX; lots recently" (09/01/2017)   Past Surgical History:  Procedure Laterality Date  . ESOPHAGOGASTRODUODENOSCOPY N/A 09/03/2017   Procedure: ESOPHAGOGASTRODUODENOSCOPY (EGD);  Surgeon: Doran Stabler, MD;  Location: [place];  Service: Gastroenterology;  Laterality: N/A;  . FINGER FRACTURE SURGERY Left    "shattered my pinky"  . FRACTURE SURGERY    . I&D EXTREMITY Left 09/18/2018   Procedure: IRRIGATION AND DEBRIDEMENT EXTREMITY;  Surgeon: Leanora Cover, MD;  [facility];  Service: Orthopedics;  Laterality: Left;  . IR PARACENTESIS  07/23/2017  . IR PARACENTESIS  07/2017   "did it twice in the same week" (09/01/2017)  . SHOULDER OPEN ROTATOR CUFF REPAIR Right   . TUBAL LIGATION    . VAGINAL HYSTERECTOMY       A IV Location/Drains/Wounds Patient Lines/Drains/Airways Status   Active Line/Drains/Airways    Name:   Placement date:   Placement time:   Site:   Days:   Peripheral IV 11/20/18 Left Wrist   11/20/18    1245    Wrist   less than 1   Incision (Closed) 09/18/18 Elbow Left   09/18/18    1827     63   Wound / Incision (Open or Dehisced) 09/21/18 Incision - Open Elbow Left;Lateral   09/21/18    1130    Elbow   60  Intake/Output Last 24 hours No intake or output data in the 24 hours ending 11/20/18 1559  Labs/Imaging Results for orders placed or performed during the hospital encounter of 11/20/18 (from the past 48 hour(s))  Lipase, blood     Status: None   Collection Time: 11/20/18 12:33 PM  Result Value Ref Range   Lipase 41 11 - 51 U/L    Comment: Performed at [facility], [address] [place], [place] [address]  Comprehensive metabolic panel     Status: Abnormal   Collection Time: 11/20/18 12:33 PM  Result Value Ref Range   Sodium 140 135 - 145 mmol/L   Potassium 3.9 3.5 - 5.1 mmol/L   Chloride 110 98 - 111 mmol/L   CO2 22 22 - 32 mmol/L   Glucose, Bld 95 70 - 99 mg/dL   BUN 5 (L) 6 - 20 mg/dL   Creatinine, Ser 0.55  0.44 - 1.00 mg/dL   Calcium 8.4 (L) 8.9 - 10.3 mg/dL   Total Protein 8.0 6.5 - 8.1 g/dL   Albumin 3.2 (L) 3.5 - 5.0 g/dL   AST 53 (H) 15 - 41 U/L   ALT 24 0 - 44 U/L   Alkaline Phosphatase 146 (H) 38 - 126 U/L   Total Bilirubin 0.8 0.3 - 1.2 mg/dL   GFR calc non Af Amer >60 >60 mL/min   GFR calc Af Amer >60 >60 mL/min   Anion gap 8 5 - 15    Comment: Performed at [facility] [address], [place] [address]  CBC with Differential     Status: Abnormal   Collection Time: 11/20/18 12:33 PM  Result Value Ref Range   WBC 2.7 (L) 4.0 - 10.5 K/uL   RBC 4.37 3.87 - 5.11 MIL/uL   Hemoglobin 13.3 12.0 - 15.0 g/dL   HCT 40.6 36.0 - 46.0 %   MCV 92.9 80.0 - 100.0 fL   MCH 30.4 26.0 - 34.0 pg   MCHC 32.8 30.0 - 36.0 g/dL   RDW 20.1 (H) 11.5 - 15.5 %   Platelets 11 (LL) 150 - 400 K/uL    Comment: REPEATED TO VERIFY PLATELET COUNT CONFIRMED BY SMEAR Immature Platelet Fraction may be clinically indicated, consider ordering this additional test [id number] THIS CRITICAL RESULT HAS VERIFIED AND BEEN CALLED TO SCOTT MCCORD RN BY ZELDA BEECH ON 03 13 2020 AT 1431, AND HAS BEEN READ BACK.     nRBC 0.0 0.0 - 0.2 %   Neutrophils Relative % 35 %   Neutro Abs 0.9 (L) 1.7 - 7.7 K/uL   Lymphocytes Relative 46 %   Lymphs Abs 1.2 0.7 - 4.0 K/uL   Monocytes Relative 14 %   Monocytes Absolute 0.4 0.1 - 1.0 K/uL   Eosinophils Relative 5 %   Eosinophils Absolute 0.1 0.0 - 0.5 K/uL   Basophils Relative 0 %   Basophils Absolute 0.0 0.0 - 0.1 K/uL   Immature Granulocytes 0 %   Abs Immature Granulocytes 0.00 0.00 - 0.07 K/uL    Comment: Performed at [facility] [address] [place], [place] [address]  Ammonia     Status: Abnormal   Collection Time: 11/20/18 12:33 PM  Result Value Ref Range   Ammonia 67 (H) 9 - 35 umol/L    Comment: Performed at [facility], [place] [address], [place], [place] [address]  Ethanol     Status: Abnormal   Collection Time: 11/20/18 12:33 PM  Result Value Ref Range   Alcohol, Ethyl (B) 273 (H) <10 mg/dL    Comment: (NOTE) Lowest detectable limit for serum alcohol is 10 mg/dL. For medical purposes only. Performed at [facility], [place] [address] [place] [address], [place], [place] [address]   Type and screen     Status: None   Collection Time: 11/20/18 12:33 PM  Result Value Ref Range   ABO/RH(D) O POS    Antibody Screen NEG    Sample Expiration      11/23/2018 Performed at [facility], [place] [address], [place], [place] [address]   Phenytoin level, total     Status: Abnormal   Collection Time: 11/20/18 12:33 PM  Result Value Ref Range   Phenytoin Lvl <2.5 (L) 10.0 - 20.0 ug/mL    Comment: Performed at [facility] [address], [place], [place] [address]  Urinalysis, Routine w reflex microscopic     Status: Abnormal   Collection Time: 11/20/18  1:03 PM  Result Value Ref Range   Color, Urine STRAW (A) YELLOW   APPearance CLEAR CLEAR   Specific Gravity, Urine 1.003 (L) 1.005 - 1.030   pH 6.0 5.0 - 8.0   Glucose, UA NEGATIVE NEGATIVE mg/dL   Hgb urine dipstick NEGATIVE NEGATIVE   Bilirubin Urine NEGATIVE NEGATIVE   Ketones, ur NEGATIVE NEGATIVE mg/dL   Protein, ur NEGATIVE NEGATIVE mg/dL   Nitrite NEGATIVE NEGATIVE   Leukocytes,Ua NEGATIVE NEGATIVE    Comment: Performed at [facility] [address] [place], [place] [address]  Rapid urine drug screen (hospital performed)     Status: Abnormal   Collection Time: 11/20/18  1:05 PM  Result Value Ref Range   Opiates NONE DETECTED NONE DETECTED   Cocaine POSITIVE (A) NONE DETECTED   Benzodiazepines POSITIVE (A) NONE DETECTED   Amphetamines NONE DETECTED NONE DETECTED   Tetrahydrocannabinol NONE DETECTED NONE DETECTED   Barbiturates NONE DETECTED NONE DETECTED    Comment: (NOTE) DRUG SCREEN FOR MEDICAL PURPOSES ONLY.  IF CONFIRMATION IS NEEDED FOR ANY PURPOSE, NOTIFY LAB WITHIN 5 DAYS. LOWEST DETECTABLE LIMITS FOR URINE DRUG SCREEN Drug Class                      Cutoff (ng/mL) Amphetamine and metabolites    1000 Barbiturate and metabolites    200 Benzodiazepine                 751 Tricyclics and metabolites     300 Opiates and metabolites        300 Cocaine and metabolites        300 THC                            50 Performed at [facility], [place] [address], [place],  [address]   POC occult blood, ED     Status: Abnormal   Collection Time: 11/20/18  1:12 PM  Result Value Ref Range   Fecal Occult Bld POSITIVE (A) NEGATIVE   Dg Forearm Left  Result Date: 11/20/2018 CLINICAL DATA:  Posterior forearm pain and swelling following injury. EXAM: LEFT FOREARM - 2 VIEW COMPARISON:  CT 09/15/2018.  Elbow radiographs 09/11/2018. FINDINGS: The mineralization and alignment are normal. There is no evidence of acute fracture or dislocation. No significant arthropathy demonstrated at the elbow or wrist. Mild dorsal soft tissue swelling without soft tissue emphysema or foreign body. IV present  in the distal forearm. IMPRESSION: Mild dorsal soft tissue swelling.  No acute osseous findings. Electronically Signed   By: Richardean Sale M.D.   On: 11/20/2018 14:04   Ct Head Wo Contrast  Result Date: 11/20/2018 CLINICAL DATA:  Altered mental status. Vomiting for the past week. EXAM: CT HEAD WITHOUT CONTRAST TECHNIQUE: Contiguous axial images were obtained from the base of the skull through the vertex without intravenous contrast. COMPARISON:  09/11/2018. FINDINGS: Brain: Streak artifact from the patient's left earring, which she refused to remove. Stable mildly enlarged ventricles and cortical sulci. No intracranial hemorrhage, mass lesion or CT evidence of acute infarction. Vascular: No hyperdense vessel or unexpected calcification. Skull: Normal. Negative for fracture or focal lesion. Sinuses/Orbits: Unremarkable. Other: None. IMPRESSION: No acute abnormality. Stable mild atrophy. Electronically Signed   By: Claudie Revering M.D.   On: 11/20/2018 14:48     Pending Labs Unresulted Labs (From admission, onward)    Start     Ordered   11/21/18 0500  HIV antibody (Routine Testing)  Tomorrow morning,   R     11/20/18 1544   11/21/18 0500  Protime-INR  Tomorrow morning,   R     11/20/18 1544   11/21/18 0500  APTT  Tomorrow morning,   R     11/20/18 1544   11/21/18 0500  Comprehensive metabolic panel  Tomorrow morning,   R     11/20/18 1544   11/21/18 0500  CBC  Tomorrow morning,   R     11/20/18 1544          Vitals/Pain Today's Vitals   11/20/18 1330 11/20/18 1345 11/20/18 1400 11/20/18 1415  BP:  103/71 105/76 99/71  Pulse: 72 65 69 72  Resp: 17     Temp:      TempSrc:      SpO2: 95% 93% 94% 94%  Weight:      Height:      PainSc:        Isolation Precautions No active isolations  Medications Medications  pantoprazole (PROTONIX) 80 mg in sodium chloride 0.9 % 250 mL (0.32 mg/mL) infusion (8 mg/hr Intravenous New Bag/Given 11/20/18 1411)  pantoprazole (PROTONIX) injection 40 mg (has no administration in time range)  lactulose (CHRONULAC) 10 GM/15ML solution 30 g (30 g Oral Not Given 11/20/18 1554)  thiamine (B-1) injection 100 mg (has no administration in time range)  pantoprazole (PROTONIX) 80 mg in sodium chloride 0.9 % 100 mL IVPB (0 mg Intravenous Stopped 11/20/18 1451)  sodium chloride 0.9 % bolus 1,000 mL (1,000 mLs Intravenous New Bag/Given 11/20/18 1553)    Mobility walks High fall risk   Focused Assessments Cardiac Assessment Handoff:  Cardiac Rhythm: Normal sinus rhythm Lab Results  Component Value Date   CKTOTAL 49 09/13/2018   TROPONINI <0.03 06/01/2018   Lab Results  Component Value Date   DDIMER 2.15 (H) 10/08/2017   Does the Patient currently have chest pain? No     R Recommendations: See Admitting Provider Note  Report given to:   Additional Notes:  Pt is an alcoholic and is requesting Ativan. Currently she has no order for this. Attempting to message the admitting residents now and  performing a CIWA.
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
## 2018-11-20 NOTE — Progress Notes (Signed)
Patient walked into the office request her platelet level be checked.  Roomed patient and after further evaluation is was determined patient needed transported to the hospital for her condition.  Patient has history of alcohol abuse. Seems this may be part of the issue today.  EMS was called and patient was transported to [facility].  Patient was transported with her purse.  Her coat went with her boyfriend who said he would meet her at the hospital.

## 2018-11-21 DIAGNOSIS — F1010 Alcohol abuse, uncomplicated: Secondary | ICD-10-CM

## 2018-11-21 LAB — CBC
HCT: 37.4 % (ref 36.0–46.0)
HCT: 38.6 % (ref 36.0–46.0)
Hemoglobin: 12.2 g/dL (ref 12.0–15.0)
Hemoglobin: 12.7 g/dL (ref 12.0–15.0)
MCH: 29.8 pg (ref 26.0–34.0)
MCH: 29.8 pg (ref 26.0–34.0)
MCHC: 32.6 g/dL (ref 30.0–36.0)
MCHC: 32.9 g/dL (ref 30.0–36.0)
MCV: 91.4 fL (ref 80.0–100.0)
MCV: 90.6 fL (ref 80.0–100.0)
Platelets: 8 10*3/uL — CL (ref 150–400)
Platelets: 9 10*3/uL — CL (ref 150–400)
RBC: 4.09 MIL/uL (ref 3.87–5.11)
RBC: 4.26 MIL/uL (ref 3.87–5.11)
RDW: 19.9 % — ABNORMAL HIGH (ref 11.5–15.5)
RDW: 19.4 % — ABNORMAL HIGH (ref 11.5–15.5)
WBC: 1.9 10*3/uL — ABNORMAL LOW (ref 4.0–10.5)
WBC: 2.3 10*3/uL — ABNORMAL LOW (ref 4.0–10.5)
nRBC: 0 % (ref 0.0–0.2)
nRBC: 0 % (ref 0.0–0.2)

## 2018-11-21 LAB — COMPREHENSIVE METABOLIC PANEL
ALT: 21 U/L (ref 0–44)
AST: 43 U/L — ABNORMAL HIGH (ref 15–41)
Albumin: 2.7 g/dL — ABNORMAL LOW (ref 3.5–5.0)
Alkaline Phosphatase: 121 U/L (ref 38–126)
Anion gap: 9 (ref 5–15)
BUN: <5 mg/dL — ABNORMAL LOW (ref 6–20)
CO2: 22 mmol/L (ref 22–32)
Calcium: 8.6 mg/dL — ABNORMAL LOW (ref 8.9–10.3)
Chloride: 105 mmol/L (ref 98–111)
Creatinine, Ser: 0.61 mg/dL (ref 0.44–1.00)
GFR calc Af Amer: >60 mL/min (ref >60)
GFR calc non Af Amer: >60 mL/min (ref >60)
Glucose, Bld: 99 mg/dL (ref 70–99)
Potassium: 3.8 mmol/L (ref 3.5–5.1)
Sodium: 136 mmol/L (ref 135–145)
Total Bilirubin: 1.5 mg/dL — ABNORMAL HIGH (ref 0.3–1.2)
Total Protein: 7.1 g/dL (ref 6.5–8.1)

## 2018-11-21 LAB — HIV ANTIBODY (ROUTINE TESTING W REFLEX): HIV Screen 4th Generation wRfx: NONREACTIVE

## 2018-11-21 LAB — PROTIME-INR
INR: 1.1 (ref 0.8–1.2)
Prothrombin Time: 14.5 seconds (ref 11.4–15.2)

## 2018-11-21 LAB — APTT: aPTT: 30 seconds (ref 24–36)

## 2018-11-21 MED ORDER — PANTOPRAZOLE SODIUM 40 MG IV SOLR
40.0000 mg | Freq: Two times a day (BID) | INTRAVENOUS | Status: DC
  Administered 2018-11-21 – 2018-11-24 (×6): 40 mg via INTRAVENOUS
  Filled 2018-11-21 (×6): qty 40

## 2018-11-21 MED ORDER — SODIUM CHLORIDE 0.9% IV SOLUTION: Freq: Once | INTRAVENOUS | Status: DC

## 2018-11-21 MED ORDER — SODIUM CHLORIDE 0.9% IV SOLUTION
Freq: Once | INTRAVENOUS | Status: AC
  Administered 2018-11-21: 11:00:00 via INTRAVENOUS

## 2018-11-21 NOTE — Progress Notes (Signed)
   Subjective: The patient continues to have dark stools and occasional epigastric pain. She endorsed feeling jittery and sweaty, something she often experiences with withdraw. She stated that she has been treated for EtOH use disorder and has stopped for at least 6 months on occasion but does not feel she needs additional assistance and will follow-up with her at home support systems. She declines further resources or counseling at this time. She would prefer that her children not know she is in the hospital.   Objective:  Vital signs in last 24 hours: Vitals:   11/20/18 1601 11/20/18 1656 11/20/18 2042 11/21/18 0500  BP: 104/79 118/84    Pulse: 67 69    Resp:      Temp:  97.6 F (36.4 C) 97.7 F (36.5 C) 97.6 F (36.4 C)  TempSrc:  Oral Oral   SpO2:  98%    Weight:  61.8 kg    Height:  5' (1.524 m)     General: A/O x4, in no acute distress Cardio: RRR, no mrg's  Pulmonary: CTA bilaterally, no wheezing or crackles  Abdomen: Bowel sounds normal, soft, tender to palpation in the RUQ MSK: BLE nontender, nonedematous Neuro: diaphoretic and tremulous, no asterixis  Psych: Appropriate affect, not depressed in appearance, engages well  Assessment/Plan:  Active Problems:   Thrombocytopenia ([place])  Briana Sweeney is a 48 yo woman with a history of alcoholic cirrhosis, hepatitis c s/p treatment, chronic thrombocytopenia, alcohol use disorder, prior alcohol withdrawals, seizures, migraines, depression, and schizophrenia who presented with intermittent coffee ground emesis and melena for the past week concerning for GI bleed. She was admitted for further evaluation and management.   GI Bleed Alcoholic Cirrhosis - Currently hemodynamically stable. Hb stable at 12.2. Ddx includes Mallory Weiss tears, variceal bleed, and peptic ulcer in the setting of thrombocytopenia. - Hx of non-bleeding varices based on 2018 EGD.  - Mild elevations in AST. RUQ ttp on exam. No ascites or asterixis on exam.  No encephalopathy.  Plan - GI consulted, appreciate recs - Lactulose  - Protonix drip - Trend CBC q8hrs - Hold home spironolactone and lasix as the patient doesn't seem to be taking these  Thrombocytopenia - Platelets 8 today. Chronic thrombocytopenia, ranging in the 40s-60s in recent months. Likely due to alcohol use and cirrhosis. PT, PTT, and INR are normal. - Leukopenic, likely also due to chronic alcoholism Plan - Transfuse 1u platelets. Post-transfusion CBC. - Transfusion goal: platelets > 10  Alcohol Use Disorder - Heavy daily drinker. Has required ICU and precedex drip for alcohol withdrawal in the past. High risk for withdrawals during this admission. - Highest CIWA 10 overnight with points for tremor, sweats, and anxiety. Required ativan twice overnight. Plan - CIWA with ativan  Seizures: continue home phenytoin 300mg  daily Migraines: continue home topamax 50mg  daily Anxiety: continue home hydroxyzine 25mg  q6hrs prn, buspar 10mg  BID, and trazodone 50mg  qhs prn   Dispo: Anticipated discharge in approximately 2-3 day(s).   Dorrell, Andree Elk, MD 11/21/2018, 6:23 AM Pager: [phone]

## 2018-11-21 NOTE — Consult Note (Signed)
[facility] Consultation Note  Referring Provider: Medical Teaching Service Primary Care Physician:  [name], FNP  Reason for Consultation:  GI bleeding  HPI: Briana Sweeney is a 48 y.o. female with history of cirrhosis (HCV, alcohol) who presents with coffee ground emesis and generalized abdominal pain.  Worsening over the past couple weeks.  Some dark stools.  No red hematemesis.  Couple admissions in past for similar reasons, endoscopy of which at that time showed small esophageal varices without stigmata as well as portal gastropathy.  Unclear if NSAIDs.  Continues to drink alcohol heavily.   Past Medical History:  Diagnosis Date  . Anxiety   . Bipolar affective disorder ([place])    With anxiety features  . Cirrhosis of liver ([place])    Due to alcohol and hepatitis C  . Depression   . 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 ETOHism ([place])   . GERD (gastroesophageal reflux disease)   . Hematemesis 02/10/2018  . Hepatitis C 2018   hepatitis c and alcohol related hepatitis  . Heroin abuse ([place])   . History of blood transfusion    "blood doesn't clot; I fell down and had to have a transfusion"  . History of kidney stones   . Migraine    "when I get really stressed" (09/01/2017)  . Schizophrenia ([place])   . Seizures ([place])    "when I run out of my RX; lots recently" (09/01/2017)    Past Surgical History:  Procedure Laterality Date  . ESOPHAGOGASTRODUODENOSCOPY N/A 09/03/2017   Procedure: ESOPHAGOGASTRODUODENOSCOPY (EGD);  Surgeon: Doran Stabler, MD;  Location: [facility];  Service: Gastroenterology;  Laterality: N/A;  . FINGER FRACTURE SURGERY Left    "shattered my pinky"  . FRACTURE SURGERY    . I&D EXTREMITY Left 09/18/2018   Procedure: IRRIGATION AND DEBRIDEMENT EXTREMITY;  Surgeon: Leanora Cover, MD;  Location: [facility];  Service: Orthopedics;  Laterality: Left;  . IR PARACENTESIS  07/23/2017  . IR PARACENTESIS  07/2017   "did it twice in the same week"  (09/01/2017)  . SHOULDER OPEN ROTATOR CUFF REPAIR Right   . TUBAL LIGATION    . VAGINAL HYSTERECTOMY      Prior to Admission medications   Medication Sig Start Date End Date Taking? Authorizing Provider  ALPRAZolam Duanne Moron) 0.5 MG tablet Take 0.5 mg by mouth 2 (two) times daily as needed for anxiety.   Yes [provider]  busPIRone (BUSPAR) 10 MG tablet Take 1 tablet (10 mg total) by mouth 2 (two) times daily. 10/20/18  Yes [name], FNP  FLUoxetine (PROZAC) 20 MG capsule Take 20 mg by mouth daily.   Yes [provider]  furosemide (LASIX) 20 MG tablet Take 1 tablet (20 mg total) by mouth daily. 09/22/18  Yes Eugenie Filler, MD  gabapentin (NEURONTIN) 300 MG capsule Take 2 capsules 2 times a day. Patient taking differently: Take 600 mg by mouth 2 (two) times daily.  10/22/18  Yes [name], FNP  haloperidol (HALDOL) 2 MG tablet Take 2 mg by mouth at bedtime.   Yes [provider]  hydrOXYzine (ATARAX/VISTARIL) 25 MG tablet Take 1 tablet (25 mg total) by mouth every 6 (six) hours as needed for itching. 10/07/18  Yes [name], FNP  phenytoin (DILANTIN) 300 MG ER capsule Take 1 capsule (300 mg total) by mouth daily. 10/20/18  Yes [name], FNP  spironolactone (ALDACTONE) 50 MG tablet Take 1 tablet (50 mg total) by  mouth daily. 09/22/18  Yes Eugenie Filler, MD  topiramate (TOPAMAX) 50 MG tablet Take 1 tablet (50 mg total) by mouth daily. 10/20/18  Yes [name], FNP  traZODone (DESYREL) 50 MG tablet Take 1 tablet (50 mg total) by mouth at bedtime as needed for sleep. Patient taking differently: Take 100 mg by mouth at bedtime.  10/08/18  Yes [name], FNP  famotidine (PEPCID) 20 MG tablet Take 1 tablet (20 mg total) by mouth daily. Patient not taking: Reported on 11/20/2018 09/23/18   Eugenie Filler, MD  FLUoxetine (PROZAC) 40 MG capsule Take 1 capsule (40 mg total) by mouth daily. 10/23/18   [name], FNP   folic acid (FOLVITE) 1 MG tablet Take 1 tablet (1 mg total) by mouth daily. Patient not taking: Reported on 11/20/2018 08/26/18   Modena Jansky, MD  lactulose (CHRONULAC) 10 GM/15ML solution Take 30 mLs (20 g total) by mouth daily. Patient not taking: Reported on 11/20/2018 07/22/18   [name], FNP  Multiple Vitamin (MULTIVITAMIN WITH MINERALS) TABS tablet Take 1 tablet by mouth daily. Patient not taking: Reported on 11/20/2018 08/26/18   Modena Jansky, MD  thiamine 100 MG tablet Take 1 tablet (100 mg total) by mouth daily. Patient not taking: Reported on 11/20/2018 08/26/18   Modena Jansky, MD    Current Facility-Administered Medications  Medication Dose Route Frequency Provider Last Rate Last Dose  . busPIRone (BUSPAR) tablet 10 mg  10 mg Oral BID Kathi Ludwig, MD   10 mg at 11/21/18 0955  . folic acid (FOLVITE) tablet 1 mg  1 mg Oral Daily Kathi Ludwig, MD   1 mg at 11/21/18 4580  . hydrOXYzine (ATARAX/VISTARIL) tablet 25 mg  25 mg Oral Q6H PRN Kathi Ludwig, MD      . lactulose ([place]) 10 GM/15ML solution 20 g  20 g Oral Daily Kathi Ludwig, MD   20 g at 11/20/18 2106  . lactulose (CHRONULAC) 10 GM/15ML solution 30 g  30 g Oral Once Jacqlyn Larsen, [place]      . LORazepam (ATIVAN) injection 2-3 mg  2-3 mg Intravenous Q1H PRN Kathi Ludwig, MD   2 mg at 11/21/18 0959  . pantoprazole (PROTONIX) 80 mg in sodium chloride 0.9 % 250 mL (0.32 mg/mL) infusion  8 mg/hr Intravenous Continuous Jacqlyn Larsen, PA-C   Stopped at 11/21/18 1127  . [START ON 11/24/2018] pantoprazole (PROTONIX) injection 40 mg  40 mg Intravenous Q12H Ford, Kelsey N, PA-C      . phenytoin (DILANTIN) ER capsule 300 mg  300 mg Oral Daily Kathi Ludwig, MD   300 mg at 11/21/18 0954  . thiamine (VITAMIN B-1) tablet 100 mg  100 mg Oral Daily Annia Belt, MD   100 mg at 11/21/18 9983   Or  . thiamine (B-1) injection 100 mg  100 mg Intravenous Daily Annia Belt, MD      . topiramate (TOPAMAX) tablet 50 mg  50 mg Oral Daily Kathi Ludwig, MD   50 mg at 11/21/18 0954  . traZODone (DESYREL) tablet 50 mg  50 mg Oral QHS PRN Kathi Ludwig, MD        Allergies as of 11/20/2018  . (No Known Allergies)    Family History  Problem Relation Age of Onset  . Lung cancer Mother 42  . Alcohol abuse Mother   . Throat cancer Father 96    Social History   Socioeconomic History  .  Marital status: Divorced    Spouse name: Not on file  . Number of children: Not on file  . Years of education: Not on file  . Highest education level: Not on file  Occupational History  . Occupation: applying for disability  Social Needs  . Financial resource strain: Not on file  . Food insecurity:    Worry: Not on file    Inability: Not on file  . Transportation needs:    Medical: Not on file    Non-medical: Not on file  Tobacco Use  . Smoking status: Never Smoker  . Smokeless tobacco: Never Used  Substance and Sexual Activity  . Alcohol use: Yes    Alcohol/week: 63.0 standard drinks    Types: 63 Cans of beer per week    Comment: weekly "I have cut back"  . Drug use: Yes    Types: Marijuana, Cocaine  . Sexual activity: Not Currently  Lifestyle  . Physical activity:    Days per week: Not on file    Minutes per session: Not on file  . Stress: Not on file  Relationships  . Social connections:    Talks on phone: Not on file    Gets together: Not on file    Attends religious service: Not on file    Active member of club or organization: Not on file    Attends meetings of clubs or organizations: Not on file    Relationship status: Not on file  . Intimate partner violence:    Fear of current or ex partner: Not on file    Emotionally abused: Not on file    Physically abused: Not on file    Forced sexual activity: Not on file  Other Topics Concern  . Not on file  Social History Narrative   She moved with a boyfriend to [place] and was  followed at [facility].  He died of a massive heart attack in 8/18, per her report, and so she moved back to [place] and is living with a friend.    Review of Systems: As per HPI, all others negative  Physical Exam: Vital signs in last 24 hours: Temp:  [97.6 F (36.4 C)-98.2 F (36.8 C)] 98.2 F (36.8 C) (03/14 1150) Pulse Rate:  [64-78] 65 (03/14 1150) Resp:  [13-20] 15 (03/14 1150) BP: (99-132)/(71-102) 124/86 (03/14 1150) SpO2:  [93 %-98 %] 96 % (03/14 1150) Weight:  [60.7 kg-61.8 kg] 61.8 kg (03/13 1656) Last BM Date: 11/20/18 General:  Somnolent but arousable, jittery from alcohol withdrawal, NAD Head:  Normocephalic and atraumatic. Eyes:  Sclera clear, no icterus.   Conjunctiva pink. Ears:  Normal auditory acuity. Nose:  No deformity, discharge,  or lesions. Mouth:  No deformity or lesions.  Oropharynx pink & moist. Neck:  Supple; no masses or thyromegaly. Lungs:  Clear throughout to auscultation.   No wheezes, crackles, or rhonchi. No acute distress. Heart:  Regular rate and rhythm; no murmurs, clicks, rubs,  or gallops. Abdomen:  Soft, mild generalized discomfort. No masses, hepatosplenomegaly or hernias noted. Normal bowel sounds, without guarding, and without rebound.     Msk:  Symmetrical without gross deformities. Normal posture. Pulses:  Normal pulses noted. Extremities:  Without clubbing or edema. Neurologic:  Somnolent but arousable,  Jittery and anxious-appearing Skin:  Scattered ecchymoses, otherwise Intact without significant lesions or rashes. Psych:  Alert and cooperative. Normal mood and affect.   Lab Results: Recent Labs    11/20/18 1233 11/20/18 1825 11/21/18 3662  WBC 2.7* 2.0* 1.9*  HGB 13.3 12.1 12.2  HCT 40.6 37.6 37.4  PLT 11* 12* 8*   BMET Recent Labs    11/20/18 1233 11/21/18 0625  NA 140 136  K 3.9 3.8  CL 110 105  CO2 22 22  GLUCOSE 95 99  BUN 5* <5*  CREATININE 0.55 0.61  CALCIUM 8.4* 8.6*   LFT Recent Labs     11/21/18 0625  PROT 7.1  ALBUMIN 2.7*  AST 43*  ALT 21  ALKPHOS 121  BILITOT 1.5*   PT/INR Recent Labs    11/21/18 0625  LABPROT 14.5  INR 1.1    Studies/Results: Dg Forearm Left  Result Date: 11/20/2018 CLINICAL DATA:  Posterior forearm pain and swelling following injury. EXAM: LEFT FOREARM - 2 VIEW COMPARISON:  CT 09/15/2018.  Elbow radiographs 09/11/2018. FINDINGS: The mineralization and alignment are normal. There is no evidence of acute fracture or dislocation. No significant arthropathy demonstrated at the elbow or wrist. Mild dorsal soft tissue swelling without soft tissue emphysema or foreign body. IV present in the distal forearm. IMPRESSION: Mild dorsal soft tissue swelling.  No acute osseous findings. Electronically Signed   By: Richardean Sale M.D.   On: 11/20/2018 14:04   Ct Head Wo Contrast  Result Date: 11/20/2018 CLINICAL DATA:  Altered mental status. Vomiting for the past week. EXAM: CT HEAD WITHOUT CONTRAST TECHNIQUE: Contiguous axial images were obtained from the base of the skull through the vertex without intravenous contrast. COMPARISON:  09/11/2018. FINDINGS: Brain: Streak artifact from the patient's left earring, which she refused to remove. Stable mildly enlarged ventricles and cortical sulci. No intracranial hemorrhage, mass lesion or CT evidence of acute infarction. Vascular: No hyperdense vessel or unexpected calcification. Skull: Normal. Negative for fracture or focal lesion. Sinuses/Orbits: Unremarkable. Other: None. IMPRESSION: No acute abnormality. Stable mild atrophy. Electronically Signed   By: Claudie Revering M.D.   On: 11/20/2018 14:48    Impression:  1.  Generalized abdominal discomfort. 2.  Coffee ground emesis, mild. 3.  History cirrhosis (alcohol). 4.  Thrombocytopenia, severe. 5.  Alcohol abuse.  Plan:  1.  Would not pursue endoscopy in absence of life-threatening hemorrhage. 2.  PPI. 3.  OK to transition PPI to IV BID. 4.  If abdominal  pain worsens, would do CT scan abd/pelvis as next step. 5.  Alcohol withdrawal protocol. 6.  [facility] will follow.   LOS: 1 day   Marivel Mcclarty M  11/21/2018, 12:16 PM  Cell [phone] If no answer or after 5 PM call [phone]

## 2018-11-21 NOTE — Plan of Care (Signed)
  Problem: Education: Goal: Knowledge of General Education information will improve Description Including pain rating scale, medication(s)/side effects and non-pharmacologic comfort measures Outcome: Progressing Note:  POC reviewed with pt.   

## 2018-11-22 DIAGNOSIS — F10239 Alcohol dependence with withdrawal, unspecified: Secondary | ICD-10-CM

## 2018-11-22 DIAGNOSIS — M25511 Pain in right shoulder: Secondary | ICD-10-CM

## 2018-11-22 DIAGNOSIS — F339 Major depressive disorder, recurrent, unspecified: Secondary | ICD-10-CM

## 2018-11-22 LAB — CBC
HCT: 39.3 % (ref 36.0–46.0)
HCT: 38 % (ref 36.0–46.0)
Hemoglobin: 13.2 g/dL (ref 12.0–15.0)
Hemoglobin: 12.9 g/dL (ref 12.0–15.0)
MCH: 30.7 pg (ref 26.0–34.0)
MCH: 30.6 pg (ref 26.0–34.0)
MCHC: 33.6 g/dL (ref 30.0–36.0)
MCHC: 33.9 g/dL (ref 30.0–36.0)
MCV: 91.4 fL (ref 80.0–100.0)
MCV: 90.3 fL (ref 80.0–100.0)
Platelets: 11 10*3/uL — CL (ref 150–400)
Platelets: 10 10*3/uL — CL (ref 150–400)
RBC: 4.3 MIL/uL (ref 3.87–5.11)
RBC: 4.21 MIL/uL (ref 3.87–5.11)
RDW: 19.6 % — ABNORMAL HIGH (ref 11.5–15.5)
RDW: 19.7 % — ABNORMAL HIGH (ref 11.5–15.5)
WBC: 1.2 10*3/uL — CL (ref 4.0–10.5)
WBC: 1.8 10*3/uL — ABNORMAL LOW (ref 4.0–10.5)
nRBC: 0 % (ref 0.0–0.2)
nRBC: 0 % (ref 0.0–0.2)

## 2018-11-22 LAB — BPAM PLATELET PHERESIS
Blood Product Expiration Date: 202003152359
ISSUE DATE / TIME: 202003141118
Unit Type and Rh: 6200

## 2018-11-22 LAB — COMPREHENSIVE METABOLIC PANEL
ALT: 21 U/L (ref 0–44)
AST: 42 U/L — ABNORMAL HIGH (ref 15–41)
Albumin: 3 g/dL — ABNORMAL LOW (ref 3.5–5.0)
Alkaline Phosphatase: 142 U/L — ABNORMAL HIGH (ref 38–126)
Anion gap: 10 (ref 5–15)
BUN: 6 mg/dL (ref 6–20)
CO2: 19 mmol/L — ABNORMAL LOW (ref 22–32)
Calcium: 8.7 mg/dL — ABNORMAL LOW (ref 8.9–10.3)
Chloride: 108 mmol/L (ref 98–111)
Creatinine, Ser: 0.81 mg/dL (ref 0.44–1.00)
GFR calc Af Amer: >60 mL/min (ref >60)
GFR calc non Af Amer: >60 mL/min (ref >60)
Glucose, Bld: 122 mg/dL — ABNORMAL HIGH (ref 70–99)
Potassium: 3.8 mmol/L (ref 3.5–5.1)
Sodium: 137 mmol/L (ref 135–145)
Total Bilirubin: 1.6 mg/dL — ABNORMAL HIGH (ref 0.3–1.2)
Total Protein: 7.6 g/dL (ref 6.5–8.1)

## 2018-11-22 LAB — PREPARE PLATELET PHERESIS: Unit division: 0

## 2018-11-22 LAB — VITAMIN B12: Vitamin B-12: 261 pg/mL (ref 180–914)

## 2018-11-22 MED ORDER — ADULT MULTIVITAMIN W/MINERALS CH
1.0000 | ORAL_TABLET | Freq: Every day | ORAL | Status: DC
  Administered 2018-11-22 – 2018-11-24 (×3): 1 via ORAL
  Filled 2018-11-22 (×3): qty 1

## 2018-11-22 NOTE — Progress Notes (Signed)
   Subjective: The patient stated that she felt improved this morning as compared to late last night when she had the episode of chills. She endorses needing the ativan at times to prevent withdrawal symptoms. She denied fever, headache, nausea, vomiting or diarrhea. She was informed that her reduced blood counts are likely the result of long standing EtOH use disorder and may be reversible to some degree with alcohol cessation. She is not interested in information regarding rehab or counselors on discharge at this time.   Objective:  Vital signs in last 24 hours: Vitals:   11/22/18 0139 11/22/18 0253 11/22/18 0321 11/22/18 0643  BP: 105/77  (!) 145/92 118/83  Pulse: 76 (!) 104 89 (!) 103  Resp: 19 12 17    Temp: (!) 97.3 F (36.3 C) 98.1 F (36.7 C)    TempSrc: Oral Oral    SpO2: 99% 100% 95%   Weight:      Height:       General: A/O x4, in no acute distress, afebrile, nondiaphoretic Cardio: RRR, no mrg's  Pulmonary: CTA bilaterally,  no wheezing or crackles  Abdomen: Bowel sounds normal, soft, nontender  Psych: Appropriate affect, not depressed in appearance, engages well  Assessment/Plan:  Active Problems:   Thrombocytopenia ([place])  Briana Sweeney is a 48 yo woman with a PMHx notable for alcoholic cirrhosis,hepatitis c s/p treatment, chronic thrombocytopenia, alcohol use disorder,prior alcohol withdrawals, seizures, migraines,depression, andschizophreniawho presented with intermittent coffee ground emesis and melena for the past week concerning for GI bleed. Upon admission it was noted that her platelet count was below her baseline of ~60K at ~11K. This was accompanied by a leukopenia concerning for alcohol toxicity of the bone marrow with suppression. She is undergoing EtOH detoxification and treatment of her pancytopenia.   GI Bleed Alcoholic Cirrhosis Currently hemodynamically stable. Hb stable at 13.2 today despite a persistant drop in her plts and WBC count. Most likely  mucosal bleeding from the thrombocytopaenia.  - Hx of non-bleeding varices based on 2018 EGD. - Mild elevations in AST. RUQ ttp on exam. No ascites or asterixis on exam. No encephalopathy but slow verbal responses.  Plan - Lactulose continued - Protonix 40mg  IV q12 hours - Trend CBC q12hrs -Holdhome spironolactone and lasix as the patient doesn't seem to be taking these and BP is tentative   Thrombocytopenia - Platelets continue to drop. Has been given two units of platelets to date. Chronic thrombocytopenia, ranging in the 40s-60s in recent months. Likely due to alcohol use and cirrhosis. PT, PTT, and INR are, however, normal. - Leukopenic, most likely also due to chronic alcoholism and worsening Plan - Transfusion goal: platelets >10, must obtain a CBC 1 hours following her next transfer  Alcohol Use Disorder Heavy daily drinker. Has required ICU and precedex drip for alcohol withdrawal in the past.High risk for withdrawals during this admission. Overnight with chills, tremor, and tactile irritation more consistent with opioid withdraw but possible with EtOH withdraw. She continues to deny opioid use. She may require a librium taper vs ICU admission if this worsens but thus far it has not. - Highest CIWA overnight 14 for headache, tremor, sweats, and anxiety. Required ativan twice overnight. Plan -CIWA with ativan continued  Seizures:continue home phenytoin 300mg  daily and topamax 50mg  daily Anxiety: continue home hydroxyzine 25mg  q6hrs prn, buspar 10mg  BID, and trazodone 50mg  qhs prn  Dispo: Anticipated discharge in approximately however many needed.   Kathi Ludwig, MD 11/22/2018, 8:28 AM Pager# [phone]

## 2018-11-22 NOTE — Progress Notes (Signed)
Informed by NT patient's visitor reported patient may be having a seizure. This RN went to patient's room and observed platelet infusion complete and patient shaking vigorously/tremoring in her bed. Patient stated that she felt cold. Patient temp taken 98.1, not able to assess BP due to tremors. Patient alert and oriented, no respiratory distress. Infustion stopped and IV flushed. MD paged at [phone] and informed MD of patient's status and that patient may be withdrawing from opioids. MD indicated to hold administering Ativan until he arrived.   This RN asked patient's visitor to step outside to complete assessment and address situation. Inquired if patient had consumed anything other than ETOH such as other medications or substances. Patient denied consuming any other substances. Patient stated lower back hurt and that her legs were aching. Removed patient's legs and observed goosebumps; goosebumps also present on arms.    MD Agyei arrived at 22. MD Assessed patient and requested 3 mg Ativan to be given. Pt stated this (tremors) has happened before, but unable to identify when happened previously. 3mg  Ativan given; will continue to monitor.   Called lab at 0508 to ensure CBC was completed. Informed by lab WBC 1.2 and Plt 11. Paged MD at [phone]. Paged 2nd MD at 0540; callback at 47. Informed MD Chundi and informed of WBC and platelet.

## 2018-11-22 NOTE — Progress Notes (Addendum)
Received page from nursing staff around 3 AM that patient was shivering.  I went up to evaluate the patient and during my assessment she was alert, oriented and conversational.  She did have noticeable body shivering which did not appear to be seizure-like activities as she was able to conversation with me.  On further questioning, she only complained of right shoulder pain and her chronic lower back pain.  She did endorse to experiencing similar symptoms 4 months ago when she was withdrawing from alcohol.  She reports that her last drink was 3 days ago and she usually drinks 24 beers daily.  Per review of day team's note, she was noted to have mild tremulous activity during rounds for which Ativan 1 mg was administered.  Objectively: Afebrile, RR18-19, HR 90s, SPO2 100% on room air, blood pressure 100s/70s.  On physical exam she was not in any acute distress, fully conversational, wide awake and alert.  Pupils were equal round and reactive to light.  The skin was warm to touch  Plan: - Most likely EtOH withdrawal and Ativan 3 mg IV was administered with noticeable improvement. - Transfusion-like reaction also a possibility however she was afebrile, not dyspneic, no noticeable rash on skin, no headaches and not hypotensive -Nursing staff advised to page MD with further complaints.

## 2018-11-23 ENCOUNTER — Other Ambulatory Visit: Payer: Self-pay

## 2018-11-23 DIAGNOSIS — M25512 Pain in left shoulder: Secondary | ICD-10-CM

## 2018-11-23 DIAGNOSIS — M545 Low back pain: Secondary | ICD-10-CM

## 2018-11-23 DIAGNOSIS — G8929 Other chronic pain: Secondary | ICD-10-CM

## 2018-11-23 LAB — CBC
HCT: 39.2 % (ref 36.0–46.0)
HCT: 39.6 % (ref 36.0–46.0)
Hemoglobin: 12.7 g/dL (ref 12.0–15.0)
Hemoglobin: 13 g/dL (ref 12.0–15.0)
MCH: 29.9 pg (ref 26.0–34.0)
MCH: 30.3 pg (ref 26.0–34.0)
MCHC: 32.4 g/dL (ref 30.0–36.0)
MCHC: 32.8 g/dL (ref 30.0–36.0)
MCV: 92.2 fL (ref 80.0–100.0)
MCV: 92.3 fL (ref 80.0–100.0)
Platelets: 12 10*3/uL — CL (ref 150–400)
Platelets: 12 10*3/uL — CL (ref 150–400)
RBC: 4.25 MIL/uL (ref 3.87–5.11)
RBC: 4.29 MIL/uL (ref 3.87–5.11)
RDW: 20.1 % — ABNORMAL HIGH (ref 11.5–15.5)
RDW: 19.9 % — ABNORMAL HIGH (ref 11.5–15.5)
WBC: 2.3 10*3/uL — ABNORMAL LOW (ref 4.0–10.5)
WBC: 2.6 10*3/uL — ABNORMAL LOW (ref 4.0–10.5)
nRBC: 0 % (ref 0.0–0.2)
nRBC: 0 % (ref 0.0–0.2)

## 2018-11-23 LAB — BPAM PLATELET PHERESIS
Blood Product Expiration Date: 202003152359
ISSUE DATE / TIME: 202003150108
Unit Type and Rh: 6200

## 2018-11-23 LAB — PREPARE PLATELET PHERESIS: Unit division: 0

## 2018-11-23 LAB — FOLATE RBC
Folate, Hemolysate: 603 ng/mL
Folate, RBC: 1595 ng/mL (ref >498)
Hematocrit: 37.8 % (ref 34.0–46.6)

## 2018-11-23 MED ORDER — LIDOCAINE 5 % EX PTCH
1.0000 | MEDICATED_PATCH | Freq: Every day | CUTANEOUS | Status: DC
  Administered 2018-11-23 – 2018-11-24 (×2): 1 via TRANSDERMAL
  Filled 2018-11-23 (×5): qty 1

## 2018-11-23 MED ORDER — DICLOFENAC SODIUM 1 % TD GEL
4.0000 g | Freq: Four times a day (QID) | TRANSDERMAL | Status: DC | PRN
  Filled 2018-11-23: qty 100

## 2018-11-23 MED ORDER — OXYCODONE HCL 5 MG PO TABS
5.0000 mg | ORAL_TABLET | Freq: Once | ORAL | Status: DC
  Filled 2018-11-23 (×2): qty 1

## 2018-11-23 NOTE — Progress Notes (Signed)
Paged MD at [facility] to request pain medication. Patient requested Advil. Informed patient may not receive Advil given bleeding risk, but will inform MD of request for pain med. Informed MD patient requested Ibuprofen.

## 2018-11-23 NOTE — Progress Notes (Signed)
48 y.o. female with history of cirrhosis (HCV, alcohol) who presents with coffee ground emesis and generalized abdominal pain. Subjective: Patient was seen and examined at bedside. She reports having dark bowel movements,Hb remains stable at 12.9/12.7. Patient states last alcohol use was 3 days ago( drinks about 24 cans of 24 ounces of beer and 1/5 of liquor on a daily basis). On soft diet, denies abdominal pain, nausea or vomiting.     Objective: Vital signs in last 24 hours: Temp:  [97.6 F (36.4 C)-97.8 F (36.6 C)] 97.7 F (36.5 C) (03/16 0100) Pulse Rate:  [75-78] 78 (03/16 0100) BP: (108-112)/(74-76) 112/76 (03/16 0100) Weight change:  Last BM Date: 11/22/18  MG:[id number] up comfortably on bed GENERAL:no pallor, no icterus ABDOMEN:soft, nondistended, nontender, normoactive bowel sounds EXTREMITIES:no pitting edema, no deformity  Lab Results: Results for orders placed or performed during the hospital encounter of 11/20/18 (from the past 48 hour(s))  CBC     Status: Abnormal   Collection Time: 11/21/18  3:50 PM  Result Value Ref Range   WBC 2.3 (L) 4.0 - 10.5 K/uL   RBC 4.26 3.87 - 5.11 MIL/uL   Hemoglobin 12.7 12.0 - 15.0 g/dL   HCT 38.6 36.0 - 46.0 %   MCV 90.6 80.0 - 100.0 fL   MCH 29.8 26.0 - 34.0 pg   MCHC 32.9 30.0 - 36.0 g/dL   RDW 19.4 (H) 11.5 - 15.5 %   Platelets 9 (LL) 150 - 400 K/uL    Comment: REPEATED TO VERIFY SPECIMEN CHECKED FOR CLOTS Immature Platelet Fraction may be clinically indicated, consider ordering this additional test [id number] CRITICAL VALUE NOTED.  VALUE IS CONSISTENT WITH PREVIOUSLY REPORTED AND CALLED VALUE.    nRBC 0.0 0.0 - 0.2 %    Comment: Performed at [facility], [place] [address], [place], [place] [address]  Prepare Pheresed Platelets     Status: None   Collection Time: 11/21/18  5:30 PM  Result Value Ref Range   Unit Number [id number]    Blood Component Type PLTP LI1 PAS    Unit division 00    Status of Unit  ISSUED,FINAL    Transfusion Status      OK TO TRANSFUSE Performed at [facility] [address], [place], [place] [address]   Comprehensive metabolic panel     Status: Abnormal   Collection Time: 11/22/18  4:08 AM  Result Value Ref Range   Sodium 137 135 - 145 mmol/L   Potassium 3.8 3.5 - 5.1 mmol/L   Chloride 108 98 - 111 mmol/L   CO2 19 (L) 22 - 32 mmol/L   Glucose, Bld 122 (H) 70 - 99 mg/dL   BUN 6 6 - 20 mg/dL   Creatinine, Ser 0.81 0.44 - 1.00 mg/dL   Calcium 8.7 (L) 8.9 - 10.3 mg/dL   Total Protein 7.6 6.5 - 8.1 g/dL   Albumin 3.0 (L) 3.5 - 5.0 g/dL   AST 42 (H) 15 - 41 U/L   ALT 21 0 - 44 U/L   Alkaline Phosphatase 142 (H) 38 - 126 U/L   Total Bilirubin 1.6 (H) 0.3 - 1.2 mg/dL   GFR calc non Af Amer >60 >60 mL/min   GFR calc Af Amer >60 >60 mL/min   Anion gap 10 5 - 15    Comment: Performed at [facility], [place] [address] [place], [place] [address]  CBC     Status: Abnormal   Collection Time: 11/22/18  4:08 AM  Result Value Ref Range   WBC 1.2 (LL) 4.0 - 10.5 K/uL    Comment: REPEATED TO VERIFY WHITE COUNT CONFIRMED ON SMEAR CRITICAL RESULT CALLED TO, READ BACK BY AND VERIFIED WITH: DAndrey Campanile [id number] 11/22/2018 T. TYSOR    RBC 4.30 3.87 - 5.11 MIL/uL   Hemoglobin 13.2 12.0 - 15.0 g/dL   HCT 39.3 36.0 - 46.0 %   MCV 91.4 80.0 - 100.0 fL   MCH 30.7 26.0 - 34.0 pg   MCHC 33.6 30.0 - 36.0 g/dL   RDW 19.6 (H) 11.5 - 15.5 %   Platelets 11 (LL) 150 - 400 K/uL    Comment: REPEATED TO VERIFY SPECIMEN CHECKED FOR CLOTS CRITICAL VALUE NOTED.  VALUE IS CONSISTENT WITH PREVIOUSLY REPORTED AND CALLED VALUE.    nRBC 0.0 0.0 - 0.2 %    Comment: Performed at [facility], [place] [address], [place], [place] [address]  Vitamin B12     Status: None   Collection Time: 11/22/18  2:32 PM  Result Value Ref Range   Vitamin B-12 261 180 - 914 pg/mL    Comment: (NOTE) This assay is not validated for testing neonatal or myeloproliferative syndrome specimens for  Vitamin B12 levels. Performed at [facility], [place] [address] [place], [place] [address]   CBC     Status: Abnormal   Collection Time: 11/22/18  3:58 PM  Result Value Ref Range   WBC 1.8 (L) 4.0 - 10.5 K/uL   RBC 4.21 3.87 - 5.11 MIL/uL   Hemoglobin 12.9 12.0 - 15.0 g/dL   HCT 38.0 36.0 - 46.0 %   MCV 90.3 80.0 - 100.0 fL   MCH 30.6 26.0 - 34.0 pg   MCHC 33.9 30.0 - 36.0 g/dL   RDW 19.7 (H) 11.5 - 15.5 %   Platelets 10 (LL) 150 - 400 K/uL    Comment: REPEATED TO VERIFY Immature Platelet Fraction may be clinically indicated, consider ordering this additional test [id number] CRITICAL VALUE NOTED.  VALUE IS CONSISTENT WITH PREVIOUSLY REPORTED AND CALLED VALUE.    nRBC 0.0 0.0 - 0.2 %    Comment: Performed at [facility], [place] [address], [place], [place] [address]  CBC     Status: Abnormal   Collection Time: 11/23/18  5:37 AM  Result Value Ref Range   WBC 2.3 (L) 4.0 - 10.5 K/uL   RBC 4.25 3.87 - 5.11 MIL/uL   Hemoglobin 12.7 12.0 - 15.0 g/dL   HCT 39.2 36.0 - 46.0 %   MCV 92.2 80.0 - 100.0 fL   MCH 29.9 26.0 - 34.0 pg   MCHC 32.4 30.0 - 36.0 g/dL   RDW 20.1 (H) 11.5 - 15.5 %   Platelets 12 (LL) 150 - 400 K/uL    Comment: REPEATED TO VERIFY PLATELET COUNT CONFIRMED BY SMEAR Immature Platelet Fraction may be clinically indicated, consider ordering this additional test [id number] CRITICAL VALUE NOTED.  VALUE IS CONSISTENT WITH PREVIOUSLY REPORTED AND CALLED VALUE.    nRBC 0.0 0.0 - 0.2 %    Comment: Performed at [facility], [place] [address] [place], [place] [address]    Studies/Results: No results found.  Medications: I have reviewed the patient's current medications.  Assessment: History of cirrhosis of liver, hepatitis C and continued alcohol use Profound thrombocytopenia and mild leukopenia Hemoglobin stable at 12.9/12.7 with a normal BUN/creatinine ratio Small esophageal varices noted on EGD from 12/18 MELDNa score of 9 History of  decompensation with ascites, medically no evidence of shifting dullness of fluid thrill, no evidence of hepatic encephalopathy   Plan: No plans on performing endoscopy with profound thrombocytopenia, high risk of spontaneous bleeding.  Agree with monitoring for alcohol withdrawal, continuation on thiamine, multivitamin and folic acid.  Patient is currently on lactulose 20 g daily.  Okay to discharge when stable from GI standpoint.  GI will sign off, please recall as needed.  Had a long discussion with the patient regarding importance of alcohol cessation and need for outpatient GI follow-up for cirrhosis and management of complications.   Ronnette Juniper 11/23/2018, 9:08 AM   Pager [phone] If no answer or after 5 PM call [phone]

## 2018-11-23 NOTE — Progress Notes (Signed)
Subjective: Overnight, patient endorsed chronic low back pain and left shoulder pain. She usually takes aspirin for these at home, but given GI bleeding, she was given a lidocaine patch and k pad. She fell asleep before receiving a dose of ordered oxycodone. This morning, she states that her pain is well controlled. She reports feeling sweaty and wanting to leave the hospital. She states that ativan does help her, but it doesn't seem to be helping as much as it was earlier in the admission. It was explained to her that she's at high risk of bleeding with her platelets so low and that the medical team would prefer to observe her and her platelet count for a few more days. She was amenable to this. She reports nausea, but denies vomiting. She has not had a bowel movement in about 24 hours. All of her questions were answered.  Objective:  Vital signs in last 24 hours: Vitals:   11/22/18 1126 11/22/18 2007 11/22/18 2200 11/23/18 0100  BP:   108/74 112/76  Pulse:   75 78  Resp:      Temp: 97.8 F (36.6 C) 97.6 F (36.4 C) 97.6 F (36.4 C) 97.7 F (36.5 C)  TempSrc: Oral  Oral Oral  SpO2:      Weight:      Height:       Gen: sitting up in bed, diaphoretic, no distress, mildly tremulous CV: RRR, no murmurs Pulm: CTAB, normal effort on room air Abd: soft, non-distended, non-tender Ext: no edema   Assessment/Plan:  Active Problems:   Thrombocytopenia ([place])  Briana Sweeney is a 48 yo woman with a PMHx notable for alcoholic cirrhosis,hepatitis c s/p treatment, chronic thrombocytopenia, alcohol use disorder,prior alcohol withdrawals, seizures, migraines,depression, andschizophreniawho presented with intermittent coffee ground emesis and melena for the past week concerning for GI bleed. Upon admission it was noted that her platelet count was below her baseline of ~60K at ~11K. This was accompanied by a leukopenia concerning for alcohol toxicity of the bone marrow with suppression. She is  undergoing EtOH detoxification and treatment of her pancytopenia.   GI Bleed Alcoholic Cirrhosis - Currently hemodynamically stable. Hb stable at12.7 today.Most likely mucosal bleeding from the thrombocytopenia. -Hx of non-bleeding varices based on 2018 EGD. - GI will not pursue endoscopy at this time due to her low platelet count. They have recommended outpatient f/u for her cirrhosis. GI has signed off. Plan - Lactulose 20g daily - Protonix 40mg  IV q12 hours - Trend CBCq12hrs -Holdhome spironolactone and lasixas the patient doesn't seem to be taking these and BP is tentative   Thrombocytopenia Leukopenia - Platelets12 today. Has been given two units of platelets to date. Chronic thrombocytopenia, ranging in the 40s-60s in recent months. White count 2.3 today. Her chronic thrombocytopenia and leukopenia are likely due to alcohol use and cirrhosis. - Her current back pain is consistent with her chronic pain. If her back pain worsens, will obtain CT imaging to rule out retroperitoneal bleed. Plan - Trend CBCs. Transfusion goal: platelets >10. If patient requires another transfusion,will obtain a CBC 1 hours following her next transfer - If the patient fevers, she will need broad spectrum antibiotics covering for pseudomonas given her leukopenia  Alcohol Use Disorder - Heavy daily drinker. Has required ICU and precedex drip for alcohol withdrawal in the past.High risk for withdrawals during this admission.  - CIWAs 13-16 overnight with n/v, tremor, sweats, visual disturbances, anxiety, headaches. She needed ativan x2 overnight. She may require a librium taper  vs ICU admission if this worsens Plan -CIWA with ativan   Seizures:continue home phenytoin 300mg  daily and topamax 50mg  daily Anxiety: continue home hydroxyzine 25mg  q6hrs prn, buspar 10mg  BID, and trazodone 50mg  qhs prn  Dispo: Anticipated discharge in approximately 2-3 days.   Sweeney, Briana Elk, MD 11/23/2018,  6:40 AM Pager: [phone]

## 2018-11-24 DIAGNOSIS — R300 Dysuria: Secondary | ICD-10-CM

## 2018-11-24 DIAGNOSIS — R070 Pain in throat: Secondary | ICD-10-CM

## 2018-11-24 DIAGNOSIS — Z5329 Procedure and treatment not carried out because of patient's decision for other reasons: Secondary | ICD-10-CM

## 2018-11-24 DIAGNOSIS — R1033 Periumbilical pain: Secondary | ICD-10-CM

## 2018-11-24 LAB — CBC
HCT: 40 % (ref 36.0–46.0)
Hemoglobin: 13.1 g/dL (ref 12.0–15.0)
MCH: 30.3 pg (ref 26.0–34.0)
MCHC: 32.8 g/dL (ref 30.0–36.0)
MCV: 92.4 fL (ref 80.0–100.0)
Platelets: 15 10*3/uL — CL (ref 150–400)
RBC: 4.33 MIL/uL (ref 3.87–5.11)
RDW: 20.1 % — ABNORMAL HIGH (ref 11.5–15.5)
WBC: 2.6 10*3/uL — ABNORMAL LOW (ref 4.0–10.5)
nRBC: 0 % (ref 0.0–0.2)

## 2018-11-24 LAB — URINALYSIS, ROUTINE W REFLEX MICROSCOPIC
Bilirubin Urine: NEGATIVE
Glucose, UA: NEGATIVE mg/dL
Ketones, ur: NEGATIVE mg/dL
Leukocytes,Ua: NEGATIVE
Nitrite: NEGATIVE
Protein, ur: NEGATIVE mg/dL
Specific Gravity, Urine: 1.016 (ref 1.005–1.030)
pH: 6 (ref 5.0–8.0)

## 2018-11-24 LAB — PATHOLOGIST SMEAR REVIEW

## 2018-11-24 MED ORDER — FLUOXETINE HCL 20 MG PO CAPS
20.0000 mg | ORAL_CAPSULE | Freq: Every day | ORAL | Status: DC
  Administered 2018-11-24: 20 mg via ORAL
  Filled 2018-11-24: qty 1

## 2018-11-24 NOTE — Progress Notes (Signed)
Pt left the facility against medical advice.  MD Dareen Piano. Notify. Pt stated" I want to leave this place".

## 2018-11-24 NOTE — Progress Notes (Signed)
   Subjective: No overnight events. Patient feels about the same as yesterday. No BM or rectal bleeding. Continues to have back and throat pain. She would like to go to home. Discussed her platelet count. While it is higher, we would like it to be a little higher before going home. She continues to require fairly frequent ativan. Discussed the importance of following up with hepatology as an outpatient. She does endorse dysuria and suprapubic abdominal pain and claims she was diagnosed with a UTI prior to admission but is unsure whether she received antibiotic treatment.   Objective:  Vital signs in last 24 hours: Vitals:   11/23/18 2212 11/23/18 2235 11/24/18 0347 11/24/18 0511  BP: 107/76 103/70 102/66 112/78  Pulse: 75  72 73  Resp: 19  18 16   Temp: 97.9 F (36.6 C)  97.8 F (36.6 C) 98.1 F (36.7 C)  TempSrc: Oral  Oral Oral  SpO2: 96%  94% 95%  Weight:      Height:       Gen: laying in bed, NAD CV: RRR, no murmurs Pulm: CTAB, normal effort on room air Abd: soft, TTP in bilateral lower quadrants without rebound or guarding, +BS Ext: no edema  Assessment/Plan:  Active Problems:   Thrombocytopenia ([place])  Briana Sweeney is a 48 yo woman with aPMHx notable foralcoholic cirrhosis,hepatitis c s/p treatment, chronic thrombocytopenia, alcohol use disorder,prior alcohol withdrawals, seizures, migraines,depression, andschizophreniawho presented with intermittent coffee ground emesis and melena for the past week concerning for GI bleed. Upon admission it was noted that her platelet count was below her baseline of ~60K at ~11K. This was accompanied by a leukopenia concerning for alcohol toxicity of the bone marrow with suppression. She is undergoing EtOH detoxification and treatment of her thrombocytopenia.  GI Bleed Alcoholic Cirrhosis - Currently hemodynamically stable. Hb stable at13.1 today.Most likely mucosal bleeding from the thrombocytopenia. -Hx of non-bleeding varices  based on 2018 EGD. GI will not pursue endoscopy at this time due to her low platelet count. They have recommended outpatient f/u for her cirrhosis. Plan - Lactulose 20g daily - Protonix40mg  IV q12 hours - Trend CBC -Holdhome spironolactone and lasixas the patient doesn't seem to be taking theseand BP is tentative  Thrombocytopenia Leukopenia - Platelets15 today. Has been given two units of platelets to date.Chronic thrombocytopenia, ranging in the 40s-60s in recent months.  - White count stable at 2.6 today. - Aiming for improvement in platelets to about 20,000. Plan - Trend CBCs. Transfusion goal: platelets >10. If patient requires another transfusion,will obtain a CBC 1 hours following her next transfer - If the patient fevers, she will need broad spectrum antibiotics covering for pseudomonas given her leukopenia  Alcohol Use Disorder - Heavy daily drinker. Has required ICU and precedex drip for alcohol withdrawal in the past.High risk for withdrawals during this admission.  - CIWAs 5-13 overnight, requiring frequent ativan dosing. She may require a librium taper vs ICU admission if this worsens Plan -CIWA with ativan  Dysuria - UA on admission without leukocytes or nitrites - Pt endorses symptoms of dysuria and suprapubic abdominal pain Plan - Repeat UA  Seizures:continue home phenytoin 300mg  daily and topamax 50mg  daily Anxiety: continue home hydroxyzine 25mg  q6hrs prn, buspar 10mg  BID, and trazodone 50mg  qhs prn Depression: resume home fluoxetine 20mg  daily  Dispo: Anticipated discharge in approximately 1-2 days.  Kiah Keay, Andree Elk, MD 11/24/2018, 6:50 AM Pager: [phone]

## 2018-11-24 NOTE — Discharge Summary (Signed)
Name: Briana Sweeney MRN: [id number] DOB: [birth year] 48 y.o. PCP: [name], FNP  Date of Admission: 11/20/2018 12:24 PM Date of Discharge: 11/24/2018 Attending Physician: Dr. Dareen Piano  Discharge Diagnosis: 1. Thrombocytopenia and leukopenia 2/2 bone marrow stress from etoh use disorder 2. GI bleeding 3. Alcoholic cirrhosis 4. ETOH use disorder 5. Alcohol-related seizures 6. Anxiety/depression  Discharge Medications: Allergies as of 11/24/2018   No Known Allergies     Medication List    STOP taking these medications   ALPRAZolam 0.5 MG tablet Commonly known as:  XANAX   famotidine 20 MG tablet Commonly known as:  PEPCID   furosemide 20 MG tablet Commonly known as:  LASIX   haloperidol 2 MG tablet Commonly known as:  HALDOL   multivitamin with minerals Tabs tablet   spironolactone 50 MG tablet Commonly known as:  ALDACTONE     TAKE these medications   busPIRone 10 MG tablet Commonly known as:  BUSPAR Take 1 tablet (10 mg total) by mouth 2 (two) times daily.   FLUoxetine 20 MG capsule Commonly known as:  PROZAC Take 20 mg by mouth daily. What changed:  Another medication with the same name was removed. Continue taking this medication, and follow the directions you see here.   folic acid 1 MG tablet Commonly known as:  FOLVITE Take 1 tablet (1 mg total) by mouth daily.   gabapentin 300 MG capsule Commonly known as:  NEURONTIN Take 2 capsules 2 times a day. What changed:    how much to take  how to take this  when to take this  additional instructions   hydrOXYzine 25 MG tablet Commonly known as:  ATARAX/VISTARIL Take 1 tablet (25 mg total) by mouth every 6 (six) hours as needed for itching.   lactulose 10 GM/15ML solution Commonly known as:  CHRONULAC Take 30 mLs (20 g total) by mouth daily.   phenytoin 300 MG ER capsule Commonly known as:  DILANTIN Take 1 capsule (300 mg total) by mouth daily.   thiamine 100 MG tablet Take 1 tablet  (100 mg total) by mouth daily.   topiramate 50 MG tablet Commonly known as:  TOPAMAX Take 1 tablet (50 mg total) by mouth daily.   traZODone 50 MG tablet Commonly known as:  DESYREL Take 1 tablet (50 mg total) by mouth at bedtime as needed for sleep. What changed:    how much to take  when to take this       Disposition and follow-up:   [name] was discharged from [facility] in Stable condition.  At the hospital follow up visit please address:  1. Thrombocytopenia and leukopenia 2/2 bone marrow suppression from etoh use disorder - Platelet count 15 and white count 2.6 on discharge. - Patient left AMA without notifying the medical team.  - Please repeat CBC to make sure platelets remain >20k - Consider starting protonix PO outpatient   2. Alcoholic cirrhosis - Please ensure patient has appropriate outpatient f/u with GI  3. Etoh use disorder - Please continue cessation conversations with patient and provide resources as she will accept - She declined resources during this hospitalization  4.  Labs / imaging needed at time of follow-up: CBC, CMP  5.  Pending labs/ test needing follow-up: none  Follow-up Appointments:   Hospital Course by problem list: 1. Thrombocytopenia and leukopenia 2/2 bone marrow stress from etoh use disorder with GI bleeding: Briana Sweeney is a 48 yo woman with aPMHx notable foralcoholic  cirrhosis,hepatitis c s/p treatment, chronic thrombocytopenia, alcohol use disorder,prior alcohol withdrawals, seizures, migraines,depression, andschizophreniawho presented with intermittent coffee ground emesis and melena for one week, concerning for GI bleed. Patient has known history of grade II esophageal varices based on 2018 EGD. Upon admission it was noted that her platelet count was below her baseline of ~60K at ~11K. This was accompanied by a leukopenia concerning for alcohol toxicity of the bone marrow with suppression. She  required platelet transfusions to remain >10K. She was started on IV protonix, which was later transitioned to PO. Her hematemesis and melena resolved. She had no other signs or symptoms of bleeding during hospital stay. Her platelet count recovered to 15. This improvement was likely caused by alcohol cessation while hospitalized. Patient left AMA without notifying the medical team. Prior to this, her risk risk of bleeding from the head and GI tract as well as other organ systems was explained to her. She had agreed to wait for discharge until her platelets were >20k. However, she left the floor hours later without further discussion.   2. Alcoholic cirrhosis: Her ammonia was initially elevated to 67. She was continued on lactulose. Her home spironolactone and lasix were held as the patient had not actually been taking them. Patient requires outpatient f/u with GI for ongoing management.  3. ETOH use disorder: ETOH was 273 on arrival. Endorsed drinking 1/2 gallon of vodka daily. Was monitored with CIWA while inpatient and required frequent ativan dosing. Her last CIWA prior to leaving AMA was 12. The patient was offered resources for cessation, but she declined.  4. Alcohol-related seizures: continued on home phenytoin 300mg  daily and topamax 50mg  daily  5. Anxiety/depression: continued on home hydroxyzine 25mg  q6hrs prn, buspar 10mg  BID, trazodone 50mg  qhs prn, and fluoxetine 20mg  daily  Discharge Vitals:   BP 114/83   Pulse 72   Temp 97.9 F (36.6 C) (Oral)   Resp 17   Ht 5' (1.524 m)   Wt 61.8 kg   SpO2 99%   BMI 26.60 kg/m   Pertinent Labs, Studies, and Procedures:  CBC Latest Ref Rng & Units 11/24/2018 11/23/2018 11/23/2018  WBC 4.0 - 10.5 K/uL 2.6(L) 2.6(L) 2.3(L)  Hemoglobin 12.0 - 15.0 g/dL 13.1 13.0 12.7  Hematocrit 36.0 - 46.0 % 40.0 39.6 39.2  Platelets 150 - 400 K/uL 15(LL) 12(LL) 12(LL)   CMP Latest Ref Rng & Units 11/22/2018 11/21/2018 11/20/2018  Glucose 70 - 99 mg/dL 122(H)  99 95  BUN 6 - 20 mg/dL 6 <5(L) 5(L)  Creatinine 0.44 - 1.00 mg/dL 0.81 0.61 0.55  Sodium 135 - 145 mmol/L 137 136 140  Potassium 3.5 - 5.1 mmol/L 3.8 3.8 3.9  Chloride 98 - 111 mmol/L 108 105 110  CO2 22 - 32 mmol/L 19(L) 22 22  Calcium 8.9 - 10.3 mg/dL 8.7(L) 8.6(L) 8.4(L)  Total Protein 6.5 - 8.1 g/dL 7.6 7.1 8.0  Total Bilirubin 0.3 - 1.2 mg/dL 1.6(H) 1.5(H) 0.8  Alkaline Phos 38 - 126 U/L 142(H) 121 146(H)  AST 15 - 41 U/L 42(H) 43(H) 53(H)  ALT 0 - 44 U/L 21 21 24    Head CT 3/13: No acute abnormality, stable mild atrophy  Discharge Instructions:   Signed: Corinne Ports, MD 11/24/2018, 6:58 PM   Pager: [phone]

## 2018-12-05 ENCOUNTER — Inpatient Hospital Stay (HOSPITAL_COMMUNITY): Payer: PRIVATE HEALTH INSURANCE

## 2018-12-05 ENCOUNTER — Other Ambulatory Visit: Payer: Self-pay

## 2018-12-05 ENCOUNTER — Encounter (HOSPITAL_COMMUNITY): Payer: Self-pay

## 2018-12-05 ENCOUNTER — Emergency Department (HOSPITAL_COMMUNITY): Payer: PRIVATE HEALTH INSURANCE

## 2018-12-05 ENCOUNTER — Inpatient Hospital Stay (HOSPITAL_COMMUNITY)
Admission: EM | Admit: 2018-12-05 | Discharge: 2018-12-14 | DRG: 442 | Disposition: A | Discharge status: Home Health | Payer: PRIVATE HEALTH INSURANCE | Attending: Internal Medicine | Admitting: Internal Medicine

## 2018-12-05 DIAGNOSIS — F419 Anxiety disorder, unspecified: Secondary | ICD-10-CM | POA: Diagnosis present

## 2018-12-05 DIAGNOSIS — H532 Diplopia: Secondary | ICD-10-CM | POA: Diagnosis present

## 2018-12-05 DIAGNOSIS — Z8619 Personal history of other infectious and parasitic diseases: Secondary | ICD-10-CM

## 2018-12-05 DIAGNOSIS — M545 Low back pain: Secondary | ICD-10-CM | POA: Diagnosis present

## 2018-12-05 DIAGNOSIS — F10129 Alcohol abuse with intoxication, unspecified: Secondary | ICD-10-CM

## 2018-12-05 DIAGNOSIS — F10239 Alcohol dependence with withdrawal, unspecified: Secondary | ICD-10-CM | POA: Diagnosis present

## 2018-12-05 DIAGNOSIS — G8929 Other chronic pain: Secondary | ICD-10-CM | POA: Diagnosis present

## 2018-12-05 DIAGNOSIS — G312 Degeneration of nervous system due to alcohol: Secondary | ICD-10-CM | POA: Diagnosis present

## 2018-12-05 DIAGNOSIS — F10229 Alcohol dependence with intoxication, unspecified: Secondary | ICD-10-CM | POA: Diagnosis present

## 2018-12-05 DIAGNOSIS — Z811 Family history of alcohol abuse and dependence: Secondary | ICD-10-CM

## 2018-12-05 DIAGNOSIS — D72819 Decreased white blood cell count, unspecified: Secondary | ICD-10-CM | POA: Diagnosis present

## 2018-12-05 DIAGNOSIS — F319 Bipolar disorder, unspecified: Secondary | ICD-10-CM | POA: Diagnosis present

## 2018-12-05 DIAGNOSIS — R4182 Altered mental status, unspecified: Secondary | ICD-10-CM

## 2018-12-05 DIAGNOSIS — D6959 Other secondary thrombocytopenia: Secondary | ICD-10-CM | POA: Diagnosis present

## 2018-12-05 DIAGNOSIS — K7290 Hepatic failure, unspecified without coma: Secondary | ICD-10-CM | POA: Diagnosis present

## 2018-12-05 DIAGNOSIS — Z7289 Other problems related to lifestyle: Secondary | ICD-10-CM | POA: Diagnosis not present

## 2018-12-05 DIAGNOSIS — K7030 Alcoholic cirrhosis of liver without ascites: Secondary | ICD-10-CM

## 2018-12-05 DIAGNOSIS — R569 Unspecified convulsions: Secondary | ICD-10-CM | POA: Diagnosis present

## 2018-12-05 DIAGNOSIS — E512 Wernicke's encephalopathy: Secondary | ICD-10-CM | POA: Diagnosis present

## 2018-12-05 DIAGNOSIS — D696 Thrombocytopenia, unspecified: Secondary | ICD-10-CM

## 2018-12-05 DIAGNOSIS — Z79899 Other long term (current) drug therapy: Secondary | ICD-10-CM

## 2018-12-05 DIAGNOSIS — F329 Major depressive disorder, single episode, unspecified: Secondary | ICD-10-CM | POA: Diagnosis not present

## 2018-12-05 DIAGNOSIS — G9340 Encephalopathy, unspecified: Secondary | ICD-10-CM | POA: Diagnosis not present

## 2018-12-05 DIAGNOSIS — K219 Gastro-esophageal reflux disease without esophagitis: Secondary | ICD-10-CM | POA: Diagnosis present

## 2018-12-05 DIAGNOSIS — R140 Abdominal distension (gaseous): Secondary | ICD-10-CM | POA: Diagnosis not present

## 2018-12-05 DIAGNOSIS — F1020 Alcohol dependence, uncomplicated: Secondary | ICD-10-CM | POA: Diagnosis present

## 2018-12-05 DIAGNOSIS — Y908 Blood alcohol level of 240 mg/100 ml or more: Secondary | ICD-10-CM | POA: Diagnosis present

## 2018-12-05 DIAGNOSIS — F209 Schizophrenia, unspecified: Secondary | ICD-10-CM

## 2018-12-05 DIAGNOSIS — Z87442 Personal history of urinary calculi: Secondary | ICD-10-CM

## 2018-12-05 DIAGNOSIS — F10232 Alcohol dependence with withdrawal with perceptual disturbance: Secondary | ICD-10-CM | POA: Diagnosis not present

## 2018-12-05 DIAGNOSIS — K7460 Unspecified cirrhosis of liver: Secondary | ICD-10-CM

## 2018-12-05 DIAGNOSIS — G43909 Migraine, unspecified, not intractable, without status migrainosus: Secondary | ICD-10-CM

## 2018-12-05 DIAGNOSIS — M549 Dorsalgia, unspecified: Secondary | ICD-10-CM | POA: Diagnosis not present

## 2018-12-05 DIAGNOSIS — B1920 Unspecified viral hepatitis C without hepatic coma: Secondary | ICD-10-CM | POA: Diagnosis present

## 2018-12-05 HISTORY — DX: Degeneration of nervous system due to alcohol: G31.2

## 2018-12-05 LAB — COMPREHENSIVE METABOLIC PANEL
ALT: 30 U/L (ref 0–44)
AST: 74 U/L — ABNORMAL HIGH (ref 15–41)
Albumin: 3.4 g/dL — ABNORMAL LOW (ref 3.5–5.0)
Alkaline Phosphatase: 157 U/L — ABNORMAL HIGH (ref 38–126)
Anion gap: 11 (ref 5–15)
BUN: <5 mg/dL — ABNORMAL LOW (ref 6–20)
CO2: 21 mmol/L — ABNORMAL LOW (ref 22–32)
Calcium: 7.7 mg/dL — ABNORMAL LOW (ref 8.9–10.3)
Chloride: 103 mmol/L (ref 98–111)
Creatinine, Ser: 0.6 mg/dL (ref 0.44–1.00)
GFR calc Af Amer: >60 mL/min (ref >60)
GFR calc non Af Amer: >60 mL/min (ref >60)
Glucose, Bld: 115 mg/dL — ABNORMAL HIGH (ref 70–99)
Potassium: 3.8 mmol/L (ref 3.5–5.1)
Sodium: 135 mmol/L (ref 135–145)
Total Bilirubin: 1.2 mg/dL (ref 0.3–1.2)
Total Protein: 8.4 g/dL — ABNORMAL HIGH (ref 6.5–8.1)

## 2018-12-05 LAB — URINALYSIS, ROUTINE W REFLEX MICROSCOPIC
Bilirubin Urine: NEGATIVE
Glucose, UA: NEGATIVE mg/dL
Hgb urine dipstick: NEGATIVE
Ketones, ur: NEGATIVE mg/dL
Leukocytes,Ua: NEGATIVE
Nitrite: NEGATIVE
Protein, ur: NEGATIVE mg/dL
Specific Gravity, Urine: 1.003 — ABNORMAL LOW (ref 1.005–1.030)
pH: 6 (ref 5.0–8.0)

## 2018-12-05 LAB — POCT I-STAT EG7
Acid-Base Excess: 1 mmol/L (ref 0.0–2.0)
Bicarbonate: 25.4 mmol/L (ref 20.0–28.0)
Calcium, Ion: 0.91 mmol/L — ABNORMAL LOW (ref 1.15–1.40)
HCT: 43 % (ref 36.0–46.0)
Hemoglobin: 14.6 g/dL (ref 12.0–15.0)
O2 Saturation: 100 %
Potassium: 3.9 mmol/L (ref 3.5–5.1)
Sodium: 141 mmol/L (ref 135–145)
TCO2: 27 mmol/L (ref 22–32)
pCO2, Ven: 40.1 mmHg — ABNORMAL LOW (ref 44.0–60.0)
pH, Ven: 7.409 (ref 7.250–7.430)
pO2, Ven: 169 mmHg — ABNORMAL HIGH (ref 32.0–45.0)

## 2018-12-05 LAB — RAPID URINE DRUG SCREEN, HOSP PERFORMED
Amphetamines: NOT DETECTED
Barbiturates: NOT DETECTED
Benzodiazepines: NOT DETECTED
Cocaine: NOT DETECTED
Opiates: NOT DETECTED
Tetrahydrocannabinol: NOT DETECTED

## 2018-12-05 LAB — CBC WITH DIFFERENTIAL/PLATELET
Abs Immature Granulocytes: 0.01 10*3/uL (ref 0.00–0.07)
Basophils Absolute: 0.1 10*3/uL (ref 0.0–0.1)
Basophils Relative: 2 %
Eosinophils Absolute: 0.1 10*3/uL (ref 0.0–0.5)
Eosinophils Relative: 4 %
HCT: 43.2 % (ref 36.0–46.0)
Hemoglobin: 13.7 g/dL (ref 12.0–15.0)
Immature Granulocytes: 0 %
Lymphocytes Relative: 56 %
Lymphs Abs: 1.8 10*3/uL (ref 0.7–4.0)
MCH: 29.8 pg (ref 26.0–34.0)
MCHC: 31.7 g/dL (ref 30.0–36.0)
MCV: 93.9 fL (ref 80.0–100.0)
Monocytes Absolute: 0.3 10*3/uL (ref 0.1–1.0)
Monocytes Relative: 8 %
Neutro Abs: 1 10*3/uL — ABNORMAL LOW (ref 1.7–7.7)
Neutrophils Relative %: 30 %
Platelets: 43 10*3/uL — ABNORMAL LOW (ref 150–400)
RBC: 4.6 MIL/uL (ref 3.87–5.11)
RDW: 19.6 % — ABNORMAL HIGH (ref 11.5–15.5)
WBC: 3.2 10*3/uL — ABNORMAL LOW (ref 4.0–10.5)
nRBC: 0 % (ref 0.0–0.2)

## 2018-12-05 LAB — PROTIME-INR
INR: 1.1 (ref 0.8–1.2)
Prothrombin Time: 14.1 seconds (ref 11.4–15.2)

## 2018-12-05 LAB — I-STAT BETA HCG BLOOD, ED (MC, WL, AP ONLY): I-stat hCG, quantitative: <5 m[IU]/mL (ref <5)

## 2018-12-05 LAB — ETHANOL: Alcohol, Ethyl (B): 377 mg/dL (ref <10)

## 2018-12-05 LAB — PHENYTOIN LEVEL, TOTAL: Phenytoin Lvl: <2.5 ug/mL — ABNORMAL LOW (ref 10.0–20.0)

## 2018-12-05 LAB — AMMONIA: Ammonia: 58 umol/L — ABNORMAL HIGH (ref 9–35)

## 2018-12-05 MED ORDER — ONDANSETRON HCL 4 MG PO TABS: 4.0000 mg | ORAL_TABLET | Freq: Four times a day (QID) | ORAL | Status: DC | PRN

## 2018-12-05 MED ORDER — LACTULOSE 10 GM/15ML PO SOLN
20.0000 g | Freq: Every day | ORAL | Status: DC
  Administered 2018-12-05 – 2018-12-07 (×3): 20 g via ORAL
  Filled 2018-12-05 (×3): qty 30

## 2018-12-05 MED ORDER — THIAMINE HCL 100 MG/ML IJ SOLN
500.0000 mg | Freq: Once | INTRAVENOUS | Status: AC
  Administered 2018-12-05: 500 mg via INTRAVENOUS
  Filled 2018-12-05 (×2): qty 5

## 2018-12-05 MED ORDER — SODIUM CHLORIDE 0.9 % IV BOLUS
500.0000 mL | Freq: Once | INTRAVENOUS | Status: AC
  Administered 2018-12-05: 500 mL via INTRAVENOUS

## 2018-12-05 MED ORDER — THIAMINE HCL 100 MG/ML IJ SOLN
250.0000 mg | INTRAVENOUS | Status: DC
  Filled 2018-12-05: qty 2.5

## 2018-12-05 MED ORDER — OXYCODONE HCL 5 MG PO TABS
5.0000 mg | ORAL_TABLET | Freq: Once | ORAL | Status: AC
  Administered 2018-12-06: 5 mg via ORAL
  Filled 2018-12-05: qty 1

## 2018-12-05 MED ORDER — PHENYTOIN SODIUM EXTENDED 100 MG PO CAPS
300.0000 mg | ORAL_CAPSULE | Freq: Every day | ORAL | Status: DC
  Administered 2018-12-05 – 2018-12-13 (×9): 300 mg via ORAL
  Filled 2018-12-05 (×10): qty 3

## 2018-12-05 MED ORDER — LORAZEPAM 2 MG/ML IJ SOLN: 1.0000 mg | Freq: Four times a day (QID) | INTRAMUSCULAR | Status: DC | PRN

## 2018-12-05 MED ORDER — FOLIC ACID 1 MG PO TABS
1.0000 mg | ORAL_TABLET | Freq: Every day | ORAL | Status: DC
  Administered 2018-12-05 – 2018-12-14 (×10): 1 mg via ORAL
  Filled 2018-12-05 (×10): qty 1

## 2018-12-05 MED ORDER — TOPIRAMATE 25 MG PO TABS
50.0000 mg | ORAL_TABLET | Freq: Every day | ORAL | Status: DC
  Administered 2018-12-05 – 2018-12-14 (×10): 50 mg via ORAL
  Filled 2018-12-05 (×10): qty 2

## 2018-12-05 MED ORDER — LORAZEPAM 1 MG PO TABS
1.0000 mg | ORAL_TABLET | Freq: Four times a day (QID) | ORAL | Status: DC | PRN
  Administered 2018-12-05 – 2018-12-06 (×3): 1 mg via ORAL
  Filled 2018-12-05 (×3): qty 1

## 2018-12-05 MED ORDER — ONDANSETRON HCL 4 MG/2ML IJ SOLN
4.0000 mg | Freq: Four times a day (QID) | INTRAMUSCULAR | Status: DC | PRN
  Administered 2018-12-09 – 2018-12-12 (×3): 4 mg via INTRAVENOUS
  Filled 2018-12-05 (×3): qty 2

## 2018-12-05 MED ORDER — ADULT MULTIVITAMIN W/MINERALS CH
1.0000 | ORAL_TABLET | Freq: Every day | ORAL | Status: DC
  Administered 2018-12-05 – 2018-12-14 (×10): 1 via ORAL
  Filled 2018-12-05 (×10): qty 1

## 2018-12-05 NOTE — ED Notes (Signed)
Pt admits to drinking a gallon of vodka today ° °

## 2018-12-05 NOTE — ED Triage Notes (Signed)
Per [facility]. Dropped off by bf at her daughters house and daughter called ems because pt wasn't acting normal, having tremors in her legs. Hx seizures, liver cirrhosis. Here 2 weeks ago due to low platelets. Felt nauseated and given 4mg  zofran. Axo to person and placed, disoriented to situation and time. Ambulatory with slight assistance. Had etoh this morning and has hx of etoh abuse, drinks every day. LSN yesterday morning. VSS

## 2018-12-05 NOTE — Progress Notes (Addendum)
Briana Sweeney is a 48 y.o. female patient admitted from ED withdrawn- oriented  X 2 - no acute distress noted. IV in place, occlusive dsg intact without redness.  Orientation to room, and floor completed with information packet given to patient/family.  Patient declined safety video at this time.  Admission INP armband ID verified with patient/family, and in place.   SR up x 2, fall assessment complete, with patient and family able to verbalize understanding of risk associated with falls, and verbalized understanding to call nsg before up out of bed.  Call light within reach, patient able to voice, and demonstrate understanding.  Skin, clean-dry- intact without evidence of bruising, or skin tears.   No evidence of skin break down noted on exam.     Will cont to eval and treat per MD orders.  Tama High, RN 12/05/2018 7:43 PM

## 2018-12-05 NOTE — ED Provider Notes (Signed)
[facility] EMERGENCY DEPARTMENT Provider Note   CSN: [id number] Arrival date & time: 12/05/18  1256    History   Chief Complaint Chief Complaint  Patient presents with   Altered Mental Status    HPI Briana Sweeney is a 48 y.o. female.    Level 5 caveat secondary to patient's altered mental status and limited ability to give history history is obtained from EMS and from chart review HPI  48 year old female history of bipolar affective disorder, cirrhosis of the liver, low platelets, alcohol abuse presents today via EMS with reports of altered mental status.  Reported that patient was with her boyfriend.  She returned to her daughter's home and was confused.  Daughter called EMS.  Patient unable to give me any further history at this time.  There is some report of alcohol abuse.     Past Medical History:  Diagnosis Date   Anxiety    Bipolar affective disorder ([place])    With anxiety features   Cirrhosis of liver ([place])    Due to alcohol and hepatitis C   Depression    Esophageal 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 (HCC)    ETOHism (HCC)    GERD (gastroesophageal reflux disease)    Hematemesis 02/10/2018   Hepatitis C 2018   hepatitis c and alcohol related hepatitis   Heroin abuse ([place])    History of blood transfusion    "blood doesn't clot; I fell down and had to have a transfusion"   History of kidney stones    Migraine    "when I get really stressed" (09/01/2017)   Schizophrenia ([place])    Seizures ([place])    "when I run out of my RX; lots recently" (09/01/2017)    Patient Active Problem List   Diagnosis Date Noted   Anxiety 10/27/2018   Neuropathy 10/27/2018   Abscess of bursa of left elbow 09/23/2018   Olecranon bursitis of left elbow    Erysipelas 62/22/9798   Acute metabolic encephalopathy 92/07/9416   Overdose 08/22/2018   Hypotension 08/22/2018   Seizure ([place]) 08/22/2018   Substance induced mood disorder ([place]) 07/17/2018    Cocaine dependence without complication ([place]) 40/81/4481   Polysubstance abuse ([place]) 04/08/2018   Encephalopathy, 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[place]) 04/08/2018   Hepatitis C 03/18/2018   Portal hypertension ([place]) 03/18/2018   Alcohol abuse with alcohol-induced disorder ([place]) 03/18/2018   Depression 03/18/2018   Upper GI bleed 02/10/2018   Alcohol withdrawal, with unspecified complication ([place]) 85/63/1497   Chronic anemia    Thrombocytopenia due to drugs    Suicidal ideation    Thrombocytopenia ([place]) 01/02/2018   GERD (gastroesophageal reflux disease) 11/01/2017   Trichimoniasis 10/30/2017   Alcohol dependence ([place]) 10/29/2017   MDD (major depressive disorder), severe ([place]) 10/28/2017   Acute hyperactive alcohol withdrawal delirium ([place]) 10/09/2017   Schizophrenia ([place]) 10/09/2017   Alcohol abuse with alcohol-induced mood disorder ([place])    Alcohol withdrawal syndrome with complication ([place]) 02/63/7858   Esophageal varices without bleeding ([place])    Hematemesis 09/01/2017   Ascites due to alcoholic cirrhosis ([place])    Decompensated hepatic cirrhosis ([place]) 07/23/2017   Alcohol abuse 07/23/2017   Hepatitis C antibody positive in blood 07/23/2017   Hypokalemia 07/23/2017   Jaundice 07/23/2017   Coagulopathy ([place]) 07/23/2017   Hypomagnesemia 07/23/2017   Pancytopenia ([place]) 85/10/7739   Alcoholic cirrhosis of liver with ascites ([place]) 07/23/2017   Bacterial vaginosis 06/03/2017   UTI (urinary tract infection) 06/02/2017    Past Surgical History:  Procedure Laterality  Date   ESOPHAGOGASTRODUODENOSCOPY N/A 09/03/2017   Procedure: ESOPHAGOGASTRODUODENOSCOPY (EGD);  Surgeon: Doran Stabler, MD;  Location: [facility];  Service: Gastroenterology;  Laterality: N/A;   FINGER FRACTURE SURGERY Left    "shattered my pinky"   FRACTURE SURGERY     I&D EXTREMITY Left 09/18/2018   Procedure: IRRIGATION AND DEBRIDEMENT EXTREMITY;  Surgeon: Leanora Cover, MD;  Location:  [facility];  Service: Orthopedics;  Laterality: Left;   IR PARACENTESIS  07/23/2017   IR PARACENTESIS  07/2017   "did it twice in the same week" (09/01/2017)   SHOULDER OPEN ROTATOR CUFF REPAIR Right    TUBAL LIGATION     VAGINAL HYSTERECTOMY       OB History   No obstetric history on file.      Home Medications    Prior to Admission medications   Medication Sig Start Date End Date Taking? Authorizing Provider  ALPRAZolam Duanne Moron) 0.5 MG tablet Take 0.5 mg by mouth 2 (two) times daily as needed for anxiety.    [provider]  busPIRone (BUSPAR) 10 MG tablet Take 1 tablet (10 mg total) by mouth 2 (two) times daily. 10/20/18   [name], FNP  famotidine (PEPCID) 20 MG tablet Take 1 tablet (20 mg total) by mouth daily. Patient not taking: Reported on 11/20/2018 09/23/18   Eugenie Filler, MD  FLUoxetine (PROZAC) 20 MG capsule Take 20 mg by mouth daily.    [provider]  FLUoxetine (PROZAC) 40 MG capsule Take 1 capsule (40 mg total) by mouth daily. 10/23/18   [name], FNP  folic acid (FOLVITE) 1 MG tablet Take 1 tablet (1 mg total) by mouth daily. Patient not taking: Reported on 11/20/2018 08/26/18   Modena Jansky, MD  furosemide (LASIX) 20 MG tablet Take 1 tablet (20 mg total) by mouth daily. 09/22/18   Eugenie Filler, MD  gabapentin (NEURONTIN) 300 MG capsule Take 2 capsules 2 times a day. Patient taking differently: Take 600 mg by mouth 2 (two) times daily.  10/22/18   [name], FNP  haloperidol (HALDOL) 2 MG tablet Take 2 mg by mouth at bedtime.    [provider]  hydrOXYzine (ATARAX/VISTARIL) 25 MG tablet Take 1 tablet (25 mg total) by mouth every 6 (six) hours as needed for itching. 10/07/18   [name], FNP  lactulose (CHRONULAC) 10 GM/15ML solution Take 30 mLs (20 g total) by mouth daily. Patient not taking: Reported on 11/20/2018 07/22/18   [name], FNP  Multiple Vitamin (MULTIVITAMIN WITH MINERALS)  TABS tablet Take 1 tablet by mouth daily. Patient not taking: Reported on 11/20/2018 08/26/18   Modena Jansky, MD  phenytoin (DILANTIN) 300 MG ER capsule Take 1 capsule (300 mg total) by mouth daily. 10/20/18   [name], FNP  spironolactone (ALDACTONE) 50 MG tablet Take 1 tablet (50 mg total) by mouth daily. 09/22/18   Eugenie Filler, MD  thiamine 100 MG tablet Take 1 tablet (100 mg total) by mouth daily. Patient not taking: Reported on 11/20/2018 08/26/18   Modena Jansky, MD  topiramate (TOPAMAX) 50 MG tablet Take 1 tablet (50 mg total) by mouth daily. 10/20/18   [name], FNP  traZODone (DESYREL) 50 MG tablet Take 1 tablet (50 mg total) by mouth at bedtime as needed for sleep. Patient taking differently: Take 100 mg by mouth at bedtime.  10/08/18   [name], FNP    Family History Family  History  Problem Relation Age of Onset   Lung cancer Mother 29   Alcohol abuse Mother    Throat cancer Father 79    Social History Social History   Tobacco Use   Smoking status: Never Smoker   Smokeless tobacco: Never Used  Substance Use Topics   Alcohol use: Yes    Alcohol/week: 63.0 standard drinks    Types: 63 Cans of beer per week    Comment: weekly "I have cut back"   Drug use: Yes    Types: Marijuana, Cocaine     Allergies   Patient has no known allergies.   Review of Systems Review of Systems  Unable to perform ROS: Mental status change     Physical Exam Updated Vital Signs BP 108/82 (BP Location: Right Arm)    Pulse 78    Temp (!) 97.4 F (36.3 C) (Oral)    Resp 16    SpO2 95%   Physical Exam Vitals signs and nursing note reviewed.  HENT:     Head: Normocephalic and atraumatic.     Right Ear: External ear normal.     Left Ear: External ear normal.     Nose: Nose normal.     Mouth/Throat:     Mouth: Mucous membranes are moist.  Eyes:     Pupils: Pupils are equal, round, and reactive to light.  Neck:     Musculoskeletal:  Normal range of motion.  Cardiovascular:     Rate and Rhythm: Normal rate and regular rhythm.     Pulses: Normal pulses.  Pulmonary:     Effort: Pulmonary effort is normal.  Abdominal:     General: Abdomen is flat. Bowel sounds are normal.     Palpations: Abdomen is soft.  Musculoskeletal: Normal range of motion.  Skin:    General: Skin is warm and dry.     Capillary Refill: Capillary refill takes less than 2 seconds.  Neurological:     General: No focal deficit present.     Mental Status: She is alert. Mental status is at baseline.     Cranial Nerves: No cranial nerve deficit.     Motor: No weakness.     Comments: Patient confused and only intermittently follows some commands she is not verbal with me in room Patient assisted to sitting and seems off balance. No obvious single limb seems to be affected      ED Treatments / Results  Labs (all labs ordered are listed, but only abnormal results are displayed) Labs Reviewed - No data to display  EKG None  Radiology Ct Head Wo Contrast  Result Date: 12/05/2018 CLINICAL DATA:  Altered level of consciousness. EXAM: CT HEAD WITHOUT CONTRAST TECHNIQUE: Contiguous axial images were obtained from the base of the skull through the vertex without intravenous contrast. COMPARISON:  CT scan of November 20, 2018. FINDINGS: Brain: Stable minimal diffuse cortical atrophy. No mass effect or midline shift is noted. Ventricular size is within normal limits. There is no evidence of mass lesion, hemorrhage or acute infarction. Vascular: No hyperdense vessel or unexpected calcification. Skull: Normal. Negative for fracture or focal lesion. Sinuses/Orbits: No acute finding. Other: None. IMPRESSION: Stable minimal diffuse cortical atrophy. No acute intracranial abnormality seen. Electronically Signed   By: Marijo Conception, M.D.   On: 12/05/2018 15:24   Dg Chest Portable 1 View  Result Date: 12/05/2018 CLINICAL DATA:  Weakness. EXAM: PORTABLE CHEST 1 VIEW  COMPARISON:  Radiographs of August 03, 2018. FINDINGS: The  heart size and mediastinal contours are within normal limits. Stable calcified left hilar lymph nodes are noted. No pneumothorax or pleural effusion is noted. Both lungs are clear. The visualized skeletal structures are unremarkable. IMPRESSION: No active disease. Electronically Signed   By: Marijo Conception, M.D.   On: 12/05/2018 14:48    Procedures .Critical Care Performed by: Pattricia Boss, MD Authorized by: Pattricia Boss, MD   Critical care provider statement:    Critical care time (minutes):  45   Critical care end time:  12/05/2018 3:58 PM   Critical care time was exclusive of:  Separately billable procedures and treating other patients   Critical care was necessary to treat or prevent imminent or life-threatening deterioration of the following conditions:  CNS failure or compromise   Critical care was time spent personally by me on the following activities:  Discussions with consultants, evaluation of patient's response to treatment, examination of patient, ordering and performing treatments and interventions, ordering and review of laboratory studies, ordering and review of radiographic studies, pulse oximetry, re-evaluation of patient's condition, obtaining history from patient or surrogate and review of old charts   (including critical care time)  Medications Ordered in ED Medications - No data to display   Initial Impression / Assessment and Plan / ED Course  I have reviewed the triage vital signs and the nursing notes.  Pertinent labs & imaging results that were available during my care of the patient were reviewed by me and considered in my medical decision making (see chart for details).      AMS: DDX Acute intoxication Intracerebral cause- normal ct No evidence of infection Liver failure Other electrolytes appear normal  Patient with history of etoh abuse, liver failure presents with ams. Here etoh  377 Ammonia mildly elevated- appears at patient baseline at 58 LFTs elevated c.w. known liver failure Patient has had some hypotension although initially normotensive.  IV fluid bolus infusing. Patient continues very altered and given liver failure expect prolonged time to clear.  Will need observation for ongoing evaluation of mental status although suspect alcohol intoxication as chief cause. Patient will also require monitoring of bp  And ammonia level Patient with some hypothermia- low index of suspicion of infectious etiology- cxr clear, mild leukopenia Platelets continue low at 43,000- but improved from first prior of 15,000  Discussed with internal medicine teaching service team and will see for admission  Final Clinical Impressions(s) / ED Diagnoses   Final diagnoses:  Altered mental status, unspecified altered mental status type    ED Discharge Orders    None       Pattricia Boss, MD 12/05/18 1558

## 2018-12-05 NOTE — ED Notes (Signed)
PT unable to provide urine at this time

## 2018-12-05 NOTE — ED Notes (Addendum)
ED TO INPATIENT HANDOFF REPORT  ED Nurse Name and Phone #: Nigel Mormon [phone]  S Name/Age/Gender Manfred Shirts 48 y.o. female Room/Bed: 033C/033C  Code Status   Code Status: Prior  Home/SNF/Other Home Patient oriented to: self, place, and situation Is this baseline? No   Triage Complete: Triage complete  Chief Complaint altered  Triage Note Per [facility]. Dropped off by bf at her daughters house and daughter called ems because pt wasn't acting normal, having tremors in her legs. Hx seizures, liver cirrhosis. Here 2 weeks ago due to low platelets. Felt nauseated and given 4mg  zofran. Axo to person and placed, disoriented to situation and time. Ambulatory with slight assistance. Had etoh this morning and has hx of etoh abuse, drinks every day. LSN yesterday morning. 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 Anxiety   . Bipolar affective disorder ([place])    With anxiety features  . Cirrhosis of liver ([place])    Due to alcohol and hepatitis C  . Depression   . 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 ETOHism ([place])   . GERD (gastroesophageal reflux disease)   . Hematemesis 02/10/2018  . Hepatitis C 2018   hepatitis c and alcohol related hepatitis  . Heroin abuse ([place])   . History of blood transfusion    "blood doesn't clot; I fell down and had to have a  transfusion"  . History of kidney stones   . Migraine    "when I get really stressed" (09/01/2017)  . Schizophrenia ([place])   . Seizures ([place])    "when I run out of my RX; lots recently" (09/01/2017)   Past Surgical History:  Procedure Laterality Date  . ESOPHAGOGASTRODUODENOSCOPY N/A 09/03/2017   Procedure: ESOPHAGOGASTRODUODENOSCOPY (EGD);  Surgeon: Doran Stabler, MD;  Location: [facility];  Service: Gastroenterology;  Laterality: N/A;  . FINGER FRACTURE SURGERY Left    "shattered my pinky"  . FRACTURE SURGERY    . I&D EXTREMITY Left 09/18/2018   Procedure: IRRIGATION AND DEBRIDEMENT EXTREMITY;  Surgeon: Leanora Cover, MD;  [facility];  Service: Orthopedics;  Laterality: Left;  . IR PARACENTESIS  07/23/2017  . IR PARACENTESIS  07/2017   "did it twice in the same week" (09/01/2017)  . SHOULDER OPEN ROTATOR CUFF REPAIR Right   . TUBAL LIGATION    . VAGINAL HYSTERECTOMY       A IV Location/Drains/Wounds Patient Lines/Drains/Airways Status   Active Line/Drains/Airways    Name:   Placement date:   Placement time:   Site:   Days:   Peripheral IV 12/05/18 Left Hand   12/05/18    -    Hand   less than 1   Incision (Closed) 09/18/18 Elbow Left   09/18/18    5498     26   Wound / Incision (Open or Dehisced) 09/21/18 Incision - Open Elbow Left;Lateral  09/21/18    1130    Elbow   75          Intake/Output Last 24 hours  Intake/Output Summary (Last 24 hours) at 12/05/2018 1703 Last data filed at 12/05/2018 1642 Gross per 24 hour  Intake 500 ml  Output 1000 ml  Net -500 ml    Labs/Imaging Results for orders placed or performed during the hospital encounter of 12/05/18 (from the past 48 hour(s))  CBC with Differential/Platelet     Status: Abnormal   Collection Time: 12/05/18  1:15 PM  Result Value Ref Range   WBC 3.2 (L) 4.0 - 10.5 K/uL   RBC 4.60 3.87 - 5.11 MIL/uL   Hemoglobin 13.7 12.0 - 15.0 g/dL   HCT 43.2 36.0 - 46.0 %   MCV 93.9 80.0 - 100.0 fL   MCH 29.8  26.0 - 34.0 pg   MCHC 31.7 30.0 - 36.0 g/dL   RDW 19.6 (H) 11.5 - 15.5 %   Platelets 43 (L) 150 - 400 K/uL    Comment: REPEATED TO VERIFY PLATELET COUNT CONFIRMED BY SMEAR SPECIMEN CHECKED FOR CLOTS Immature Platelet Fraction may be clinically indicated, consider ordering this additional test [id number]    nRBC 0.0 0.0 - 0.2 %   Neutrophils Relative % 30 %   Neutro Abs 1.0 (L) 1.7 - 7.7 K/uL   Lymphocytes Relative 56 %   Lymphs Abs 1.8 0.7 - 4.0 K/uL   Monocytes Relative 8 %   Monocytes Absolute 0.3 0.1 - 1.0 K/uL   Eosinophils Relative 4 %   Eosinophils Absolute 0.1 0.0 - 0.5 K/uL   Basophils Relative 2 %   Basophils Absolute 0.1 0.0 - 0.1 K/uL   Immature Granulocytes 0 %   Abs Immature Granulocytes 0.01 0.00 - 0.07 K/uL    Comment: Performed at [facility], [address] [place], [place] [address]  Comprehensive metabolic panel     Status: Abnormal   Collection Time: 12/05/18  1:15 PM  Result Value Ref Range   Sodium 135 135 - 145 mmol/L   Potassium 3.8 3.5 - 5.1 mmol/L   Chloride 103 98 - 111 mmol/L   CO2 21 (L) 22 - 32 mmol/L   Glucose, Bld 115 (H) 70 - 99 mg/dL   BUN <5 (L) 6 - 20 mg/dL   Creatinine, Ser 0.60 0.44 - 1.00 mg/dL   Calcium 7.7 (L) 8.9 - 10.3 mg/dL   Total Protein 8.4 (H) 6.5 - 8.1 g/dL   Albumin 3.4 (L) 3.5 - 5.0 g/dL   AST 74 (H) 15 - 41 U/L   ALT 30 0 - 44 U/L   Alkaline Phosphatase 157 (H) 38 - 126 U/L   Total Bilirubin 1.2 0.3 - 1.2 mg/dL   GFR calc non Af Amer >60 >60 mL/min   GFR calc Af Amer >60 >60 mL/min   Anion gap 11 5 - 15    Comment: Performed at [facility], [place] [address], [place], [place] [address]  Ammonia     Status: Abnormal   Collection Time: 12/05/18  1:15 PM  Result Value Ref Range   Ammonia 58 (H) 9 - 35 umol/L    Comment: Performed at [facility], [place] [address] [place], [place] [address]  Ethanol     Status: Abnormal   Collection Time: 12/05/18  1:15 PM  Result Value Ref Range   Alcohol, Ethyl (B)  377 (HH) <10 mg/dL    Comment: CRITICAL RESULT  CALLED TO, READ BACK BY AND VERIFIED WITH: Tonita Cong RN AT 0102 12/05/2018 BY WOOLLENK (NOTE) Lowest detectable limit for serum alcohol is 10 mg/dL. For medical purposes only. Performed at [facility], [place] [address], [place], [place] [address]   I-Stat Beta hCG blood, ED ([facility], [facility], [facility] only)     Status: None   Collection Time: 12/05/18  1:40 PM  Result Value Ref Range   I-stat hCG, quantitative <5.0 <5 mIU/mL   Comment 3            Comment:   GEST. AGE      CONC.  (mIU/mL)   <=1 WEEK        5 - 50     2 WEEKS       50 - 500     3 WEEKS       100 - 10,000     4 WEEKS     1,000 - 30,000        FEMALE AND NON-PREGNANT FEMALE:     LESS THAN 5 mIU/mL   POCT I-Stat EG7     Status: Abnormal   Collection Time: 12/05/18  1:48 PM  Result Value Ref Range   pH, Ven 7.409 7.250 - 7.430   pCO2, Ven 40.1 (L) 44.0 - 60.0 mmHg   pO2, Ven 169.0 (H) 32.0 - 45.0 mmHg   Bicarbonate 25.4 20.0 - 28.0 mmol/L   TCO2 27 22 - 32 mmol/L   O2 Saturation 100.0 %   Acid-Base Excess 1.0 0.0 - 2.0 mmol/L   Sodium 141 135 - 145 mmol/L   Potassium 3.9 3.5 - 5.1 mmol/L   Calcium, Ion 0.91 (L) 1.15 - 1.40 mmol/L   HCT 43.0 36.0 - 46.0 %   Hemoglobin 14.6 12.0 - 15.0 g/dL   Patient temperature HIDE    Sample type VENOUS   Urinalysis, Routine w reflex microscopic     Status: Abnormal   Collection Time: 12/05/18  4:35 PM  Result Value Ref Range   Color, Urine YELLOW YELLOW   APPearance CLEAR CLEAR   Specific Gravity, Urine 1.003 (L) 1.005 - 1.030   pH 6.0 5.0 - 8.0   Glucose, UA NEGATIVE NEGATIVE mg/dL   Hgb urine dipstick NEGATIVE NEGATIVE   Bilirubin Urine NEGATIVE NEGATIVE   Ketones, ur NEGATIVE NEGATIVE mg/dL   Protein, ur NEGATIVE NEGATIVE mg/dL   Nitrite NEGATIVE NEGATIVE   Leukocytes,Ua NEGATIVE NEGATIVE    Comment: Performed at [facility], [place] [address] ,  [address]   Ct Head Wo Contrast  Result Date:  12/05/2018 CLINICAL DATA:  Altered level of consciousness. EXAM: CT HEAD WITHOUT CONTRAST TECHNIQUE: Contiguous axial images were obtained from the base of the skull through the vertex without intravenous contrast. COMPARISON:  CT scan of November 20, 2018. FINDINGS: Brain: Stable minimal diffuse cortical atrophy. No mass effect or midline shift is noted. Ventricular size is within normal limits. There is no evidence of mass lesion, hemorrhage or acute infarction. Vascular: No hyperdense vessel or unexpected calcification. Skull: Normal. Negative for fracture or focal lesion. Sinuses/Orbits: No acute finding. Other: None. IMPRESSION: Stable minimal diffuse cortical atrophy. No acute intracranial abnormality seen. Electronically Signed   By: Marijo Conception, M.D.   On: 12/05/2018 15:24   Dg Chest Portable 1 View  Result Date: 12/05/2018 CLINICAL DATA:  Weakness. EXAM: PORTABLE CHEST 1 VIEW COMPARISON:  Radiographs of August 03, 2018. FINDINGS: The heart size and mediastinal contours are within  normal limits. Stable calcified left hilar lymph nodes are noted. No pneumothorax or pleural effusion is noted. Both lungs are clear. The visualized skeletal structures are unremarkable. IMPRESSION: No active disease. Electronically Signed   By: Marijo Conception, M.D.   On: 12/05/2018 14:48    Pending Labs Unresulted Labs (From admission, onward)    Start     Ordered   12/05/18 1337  Blood gas, venous (at [facility] and [facility])  ONCE - STAT,   STAT     12/05/18 1336   12/05/18 1336  Rapid urine drug screen (hospital performed)  ONCE - STAT,   R     12/05/18 1336   Signed and Held  Comprehensive metabolic panel  Tomorrow morning,   R     Signed and Held   Signed and Held  CBC  Tomorrow morning,   R     Signed and Held          Vitals/Pain Today's Vitals   12/05/18 1500 12/05/18 1545 12/05/18 1619 12/05/18 1631  BP: 90/62 (!) 88/56 104/75 118/73  Pulse: 68 70 75 81  Resp: 16 15 19 18   Temp:      TempSrc:       SpO2:  97% 98% 98%  PainSc:        Isolation Precautions No active isolations  Medications Medications  thiamine 500mg  in normal saline (24ml) IVPB (has no administration in time range)  sodium chloride 0.9 % bolus 500 mL (0 mLs Intravenous Stopped 12/05/18 1643)    Mobility walks with person assist High fall risk   Focused Assessments Cardiac Assessment Handoff:  Cardiac Rhythm: Normal sinus rhythm Lab Results  Component Value Date   CKTOTAL 49 09/13/2018   TROPONINI <0.03 06/01/2018   Lab Results  Component Value Date   DDIMER 2.15 (H) 10/08/2017   Does the Patient currently have chest pain? No  , Neuro Assessment Handoff:  Swallow screen pass? N/A Cardiac Rhythm: Normal sinus rhythm   Last date known well: 12/04/18   Neuro Assessment: Exceptions to WDL Neuro Checks:      Last Documented NIHSS Modified Score:   Has TPA been given? No If patient is a Neuro Trauma and patient is going to OR before floor call report to [facility] nurse: [phone] or [phone]     R Recommendations: See Admitting Provider Note  Report given to: Herbert Spires, RN 5 [place]  Additional Notes:  Patient was dropped off at her daughter's house by patient's boyfriend (patient lives with boyfriend) around noon today - daughter called EMS because patient was not acting herself - per EMS, daughter stated that patient was more confused and was shaking. 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 Tremors to bilateral arms noted, ataxia, unable to sit up on own. Patient c/o nausea and headache. Able to stand with 1 assist to bedside commode.

## 2018-12-05 NOTE — ED Notes (Signed)
Patient able to stand with at least 1 assist to bedside commode - she appears to be more awake and following commands, but remains ataxic and continues to have tremors, mainly in upper extremities. Patient assisted back into bed, repositioned, call light within reach and patient in front of nurse's station.

## 2018-12-05 NOTE — ED Notes (Signed)
Admitting at bedside 

## 2018-12-05 NOTE — H&P (Signed)
Date: 12/05/2018               Patient Name:  Briana Sweeney MRN: [id number]  DOB: 01-01-1971 Age / Sex: 48 y.o., female   PCP: [name], FNP         Medical Service: Internal Medicine Teaching Service         Attending Physician: Dr. Lucious Groves, DO    First Contact: Dr. Laural Golden Pager: [phone]  Second Contact: Dr. Philipp Ovens Pager: [phone]       After Hours (After 5p/  First Contact Pager: [phone]  weekends / holidays): Second Contact Pager: [phone]   Chief Complaint: Altered mental status   History of Present Illness: [name] is a 48 year old female with a history of alcoholic cirrhosis, hepatitis c s/p treatment, chronic thrombocytopenia, alcohol use disorder, prior alcohol withdrawals, seizures, migraines, depression, and schizophrenia who presented to the ED with altered mental status. She was recently admitted 2 weeks ago due to a GI bleed, alcoholic cirrhosis and thrombocytopenia.  History was limited due to patient's mental status. Per chart review, patient was with her boyfriend and she returned to her daughter's home confused. Daughter called the EMS and she was brought to the ED. Patient states all she remembers was drinking this morning and smoking marijuana. She denies any falls. She states she is having a headache and having abdominal pain. States she has only been taking her seizure medication.   ED course: On arrival, she was hemodynamically stable. Hypothermic at 96.5. Rapid urine drug screen was negative. Ammonia and ethanol level elevated. CT head no acute abnormality. Chest xray no active disease.   Meds:  No current facility-administered medications on file prior to encounter.    Current Outpatient Medications on File Prior to Encounter  Medication Sig Dispense Refill  . ALPRAZolam (XANAX) 0.5 MG tablet Take 0.5 mg by mouth 2 (two) times daily as needed for anxiety.    . busPIRone (BUSPAR) 10 MG tablet Take 1 tablet (10 mg total) by mouth 2  (two) times daily. 30 tablet 2  . famotidine (PEPCID) 20 MG tablet Take 1 tablet (20 mg total) by mouth daily. (Patient not taking: Reported on 11/20/2018) 30 tablet 0  . FLUoxetine (PROZAC) 20 MG capsule Take 20 mg by mouth daily.    [name] Kitchen FLUoxetine (PROZAC) 40 MG capsule Take 1 capsule (40 mg total) by mouth daily. 30 capsule 3  . folic acid (FOLVITE) 1 MG tablet Take 1 tablet (1 mg total) by mouth daily. (Patient not taking: Reported on 11/20/2018) 30 tablet 0  . furosemide (LASIX) 20 MG tablet Take 1 tablet (20 mg total) by mouth daily. 30 tablet 0  . gabapentin (NEURONTIN) 300 MG capsule Take 2 capsules 2 times a day. (Patient taking differently: Take 600 mg by mouth 2 (two) times daily. ) 120 capsule 3  . haloperidol (HALDOL) 2 MG tablet Take 2 mg by mouth at bedtime.    . hydrOXYzine (ATARAX/VISTARIL) 25 MG tablet Take 1 tablet (25 mg total) by mouth every 6 (six) hours as needed for itching. 20 tablet 0  . lactulose (CHRONULAC) 10 GM/15ML solution Take 30 mLs (20 g total) by mouth daily. (Patient not taking: Reported on 11/20/2018) 240 mL 3  . Multiple Vitamin (MULTIVITAMIN WITH MINERALS) TABS tablet Take 1 tablet by mouth daily. (Patient not taking: Reported on 11/20/2018)    . phenytoin (DILANTIN) 300 MG ER capsule Take 1 capsule (300 mg total) by mouth  daily. 30 capsule 2  . spironolactone (ALDACTONE) 50 MG tablet Take 1 tablet (50 mg total) by mouth daily. 30 tablet 0  . thiamine 100 MG tablet Take 1 tablet (100 mg total) by mouth daily. (Patient not taking: Reported on 11/20/2018) 30 tablet 0  . topiramate (TOPAMAX) 50 MG tablet Take 1 tablet (50 mg total) by mouth daily. 30 tablet 2  . traZODone (DESYREL) 50 MG tablet Take 1 tablet (50 mg total) by mouth at bedtime as needed for sleep. (Patient taking differently: Take 100 mg by mouth at bedtime. ) 30 tablet 3   No outpatient medications have been marked as taking for the 12/05/18 encounter [facility] Encounter).     Allergies: Allergies as  of 12/05/2018  . (No Known Allergies)   Past Medical History:  Diagnosis Date  . Anxiety   . Bipolar affective disorder ([place])    With anxiety features  . Cirrhosis of liver ([place])    Due to alcohol and hepatitis C  . Depression   . 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 ETOHism ([place])   . GERD (gastroesophageal reflux disease)   . Hematemesis 02/10/2018  . Hepatitis C 2018   hepatitis c and alcohol related hepatitis  . Heroin abuse ([place])   . History of blood transfusion    "blood doesn't clot; I fell down and had to have a transfusion"  . History of kidney stones   . Migraine    "when I get really stressed" (09/01/2017)  . Schizophrenia ([place])   . Seizures ([place])    "when I run out of my RX; lots recently" (09/01/2017)    Family History:  Family History  Problem Relation Age of Onset  . Lung cancer Mother 42  . Alcohol abuse Mother   . Throat cancer Father 39    Social History:  Social History   Socioeconomic History  . Marital status: Divorced    Spouse name: Not on file  . Number of children: Not on file  . Years of education: Not on file  . Highest education level: Not on file  Occupational History  . Occupation: applying for disability  Social Needs  . Financial resource strain: Not on file  . Food insecurity:    Worry: Not on file    Inability: Not on file  . Transportation needs:    Medical: Not on file    Non-medical: Not on file  Tobacco Use  . Smoking status: Never Smoker  . Smokeless tobacco: Never Used  Substance and Sexual Activity  . Alcohol use: Yes    Alcohol/week: 63.0 standard drinks    Types: 63 Cans of beer per week    Comment: weekly "I have cut back"  . Drug use: Yes    Types: Marijuana, Cocaine  . Sexual activity: Not Currently  Lifestyle  . Physical activity:    Days per week: Not on file    Minutes per session: Not on file  . Stress: Not on file  Relationships  . Social connections:    Talks on phone: Not on file    Gets  together: Not on file    Attends religious service: Not on file    Active member of club or organization: Not on file    Attends meetings of clubs or organizations: Not on file    Relationship status: Not on file  . Intimate partner violence:    Fear of current or ex partner: Not on file  Emotionally abused: Not on file    Physically abused: Not on file    Forced sexual activity: Not on file  Other Topics Concern  . Not on file  Social History Narrative   She moved with a boyfriend to [place] and was [facility] at [facility].  He died of a massive heart attack in 8/18, per her report, and so she moved back to [place] and is living with a friend.    Review of Systems: A complete ROS was negative except as per HPI.   Physical Exam: Blood pressure 118/73, pulse 81, temperature (!) 96.5 F (35.8 C), temperature source Rectal, resp. rate 18, SpO2 98 %.  Physical Exam  Constitutional: She is oriented to person, place, and time.  Somnolent, slow to respond, intoxicated, no distress   Eyes: Conjunctivae and EOM are normal. Right eye exhibits no discharge. Left eye exhibits no discharge.  Disconjugate gaze  Cardiovascular: Normal rate, regular rhythm, normal heart sounds and intact distal pulses.  No murmur heard. Pulmonary/Chest: Effort normal and breath sounds normal. No respiratory distress. She has no wheezes.  Abdominal: Soft. Bowel sounds are normal. She exhibits no distension. There is no abdominal tenderness.  Musculoskeletal:        General: Edema present. No tenderness.  Neurological: She is alert and oriented to person, place, and time.  Truncal ataxia  Skin: Skin is warm and dry. She is not diaphoretic.    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 Wilmary Levit is a  48 year old female with a history of alcoholic cirrhosis, hepatitis c s/p treatment, chronic thrombocytopenia, alcohol use disorder, prior alcohol withdrawals, seizures, migraines, depression, and schizophrenia who presented to the ED with altered mental status. CT head negative. Ethanol level found to be elevated.   Acute alcoholic encephalopathy Encephalopathy is likely due to alcohol intoxication given BAL. However, due to dysconjugate gaze and ataxia concerning for wernicke's. Giving IV thiamine 500 mg x1 and getting an MRI brain. Differential also includes hepatic encephalopathy vs SBP. - Continue home lactulose  - Thiamine 500 mg IV once  - MRI brain - Abdominal US  Alcohol Use Disorder: Heavy daily drinker. Has required ICU and precedex drip for alcohol withdrawal in the past. High risk for withdrawals - CIWA with ativan - Thiamine, folic acid, multivitamin  Thrombocytopenia: Platelets 43 on admission. Chronic thrombocytopenia, ranging in the 40s-60s in recent months. Likely due to alcohol use and cirrhosis.  - Trend CBC  Seizures: continue home phenytoin 300mg  daily, checking levels  Migraines: continue home topamax 50mg  daily Anxiety: holding home hydroxyzine 25mg  q6hrs prn, buspar 10mg  BID, and trazodone 50mg  qhs prn   FEN: no IVF, NPO DVT ppx:  Code status:FULL code, reassess when patient is more alert   Dispo: Admit patient to Inpatient with expected length of stay greater than 2 midnights.  SignedMike Craze, DO 12/05/2018, 5:26 PM  Pager: [phone]

## 2018-12-05 NOTE — ED Notes (Signed)
Patient transported to CT 

## 2018-12-06 ENCOUNTER — Inpatient Hospital Stay (HOSPITAL_COMMUNITY): Payer: PRIVATE HEALTH INSURANCE

## 2018-12-06 DIAGNOSIS — Z7289 Other problems related to lifestyle: Secondary | ICD-10-CM

## 2018-12-06 DIAGNOSIS — M545 Low back pain: Secondary | ICD-10-CM

## 2018-12-06 DIAGNOSIS — G8929 Other chronic pain: Secondary | ICD-10-CM

## 2018-12-06 DIAGNOSIS — Y908 Blood alcohol level of 240 mg/100 ml or more: Secondary | ICD-10-CM

## 2018-12-06 LAB — COMPREHENSIVE METABOLIC PANEL
ALT: 27 U/L (ref 0–44)
AST: 61 U/L — ABNORMAL HIGH (ref 15–41)
Albumin: 3.2 g/dL — ABNORMAL LOW (ref 3.5–5.0)
Alkaline Phosphatase: 138 U/L — ABNORMAL HIGH (ref 38–126)
Anion gap: 8 (ref 5–15)
BUN: <5 mg/dL — ABNORMAL LOW (ref 6–20)
CO2: 21 mmol/L — ABNORMAL LOW (ref 22–32)
Calcium: 8.4 mg/dL — ABNORMAL LOW (ref 8.9–10.3)
Chloride: 109 mmol/L (ref 98–111)
Creatinine, Ser: 0.65 mg/dL (ref 0.44–1.00)
GFR calc Af Amer: >60 mL/min (ref >60)
GFR calc non Af Amer: >60 mL/min (ref >60)
Glucose, Bld: 71 mg/dL (ref 70–99)
Potassium: 4 mmol/L (ref 3.5–5.1)
Sodium: 138 mmol/L (ref 135–145)
Total Bilirubin: 1.3 mg/dL — ABNORMAL HIGH (ref 0.3–1.2)
Total Protein: 7.7 g/dL (ref 6.5–8.1)

## 2018-12-06 LAB — CBC
HCT: 41.5 % (ref 36.0–46.0)
Hemoglobin: 13.4 g/dL (ref 12.0–15.0)
MCH: 30.4 pg (ref 26.0–34.0)
MCHC: 32.3 g/dL (ref 30.0–36.0)
MCV: 94.1 fL (ref 80.0–100.0)
Platelets: 26 10*3/uL — CL (ref 150–400)
RBC: 4.41 MIL/uL (ref 3.87–5.11)
RDW: 19.3 % — ABNORMAL HIGH (ref 11.5–15.5)
WBC: 2.3 10*3/uL — ABNORMAL LOW (ref 4.0–10.5)
nRBC: 0 % (ref 0.0–0.2)

## 2018-12-06 MED ORDER — GADOBUTROL 1 MMOL/ML IV SOLN
6.0000 mL | Freq: Once | INTRAVENOUS | Status: AC | PRN
  Administered 2018-12-06: 6 mL via INTRAVENOUS

## 2018-12-06 MED ORDER — FLUOXETINE HCL 20 MG PO CAPS
20.0000 mg | ORAL_CAPSULE | Freq: Every day | ORAL | Status: DC
  Administered 2018-12-06 – 2018-12-14 (×9): 20 mg via ORAL
  Filled 2018-12-06 (×9): qty 1

## 2018-12-06 MED ORDER — THIAMINE HCL 100 MG/ML IJ SOLN
500.0000 mg | Freq: Three times a day (TID) | INTRAVENOUS | Status: DC
  Administered 2018-12-06 – 2018-12-07 (×6): 500 mg via INTRAVENOUS
  Filled 2018-12-06 (×7): qty 5

## 2018-12-06 MED ORDER — LORAZEPAM 2 MG/ML IJ SOLN
2.0000 mg | INTRAMUSCULAR | Status: DC | PRN
  Administered 2018-12-06 – 2018-12-07 (×7): 2 mg via INTRAVENOUS
  Filled 2018-12-06 (×9): qty 1

## 2018-12-06 MED ORDER — CHLORDIAZEPOXIDE HCL 25 MG PO CAPS
25.0000 mg | ORAL_CAPSULE | Freq: Three times a day (TID) | ORAL | Status: DC
  Administered 2018-12-06 (×3): 25 mg via ORAL
  Filled 2018-12-06 (×3): qty 1

## 2018-12-06 MED ORDER — VITAMIN B-1 100 MG PO TABS: 100.0000 mg | ORAL_TABLET | Freq: Every day | ORAL | Status: DC

## 2018-12-06 MED ORDER — SODIUM CHLORIDE 0.9 % IV SOLN
INTRAVENOUS | Status: AC
  Administered 2018-12-06: 08:00:00 via INTRAVENOUS

## 2018-12-06 MED ORDER — LIDOCAINE 5 % EX PTCH
1.0000 | MEDICATED_PATCH | CUTANEOUS | Status: DC
  Administered 2018-12-06 – 2018-12-14 (×9): 1 via TRANSDERMAL
  Filled 2018-12-06 (×9): qty 1

## 2018-12-06 MED ORDER — ACETAMINOPHEN 325 MG PO TABS
650.0000 mg | ORAL_TABLET | Freq: Three times a day (TID) | ORAL | Status: DC | PRN
  Administered 2018-12-06 – 2018-12-07 (×3): 650 mg via ORAL
  Filled 2018-12-06 (×3): qty 2

## 2018-12-06 NOTE — Progress Notes (Addendum)
Subjective: Overnight, Briana Sweeney experienced headache and back pain for which she received a one time dose of oxycodone and a lidocaine patch. She states these have both been present for about a week. The back pain is 10/10 and present all across her low back. The oxycodone and lidocaine patch helped a bit. The headache does not feel like her normal migraines, but she is unable to describe it further. She reports that she hasn't felt well since leaving the hospital AMA and thinks she left too early. She has had nausea, dizziness, and double vision. She remembers feeling confused yesterday when she came to the hospital. She states she still feels a little confused, but this is better compared to yesterday. She has noticed signs of alcohol withdrawal already including diaphoresis and tremors.    Objective:  Vital signs in last 24 hours: Vitals:   12/05/18 1757 12/05/18 2348 12/06/18 0451 12/06/18 0613  BP: 109/74 109/75 97/72   Pulse: 65 86 76 81  Resp: 17 15 15 15   Temp: 97.7 F (36.5 C) 98.4 F (36.9 C) 97.8 F (36.6 C)   TempSrc: Oral Axillary Axillary   SpO2: 100% 96% 95% 99%  Weight:    59.9 kg   Gen: alert and oriented x4, diaphoretic Neuro: PERRL. EOMI. Nystagmus with lateral gaze bilaterally. Finger-to-nose with tremor, but no dysmetria.  CV: RRR, no murmurs Pulm: CTAB, normal effort on room air Abd: soft, non-distended, RUQ ttp Back: bilateral and central ttp of the low back. No bruises or deformities. 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 is a 48 year old female with a history of alcoholic cirrhosis,hepatitis c s/p treatment, chronic thrombocytopenia, alcohol use disorder,prior alcohol withdrawals, seizures, migraines,depression, andschizophrenia who presented to the ED withaltered mental status. CT head was negative. Ethanol level was >300.  Acute alcoholic encephalopathy - Mentation has improved  today and patient is alert and oriented x4. Nystagmus and tremors present. Ddx includes acute intoxication, alcohol withdrawal, hepatic encephalopathy, Wernicke's encephalopathy, and cerebellar degeneration 2/2 chronic etoh use.  - MRI negative for signs Wernicke's encephalopathy, however will continue to treat with high dose thiamine given nystagmus and ataxia.  - Brain MRI showed alcoholic encephalopathy and advanced cerebral atrophy for age, but did not comment on cerebellar degeneration.  - Abdominal US showed cirrhosis, but no ascites. Low suspicion of SBP. Plan - Continue home lactulose  - Thiamine 500 mg IV TID for two days. Followed by 250mg  IV for 5 days.  Alcohol Use Disorder - Heavy daily drinker. Has required ICU and precedex drip for alcohol withdrawal in the past.High risk for withdrawals. - CIWAs were 12 overnight, requiring ativan x3. Patient appears to be actively withdrawing. Plan - Increase CIWA monitoring to progressive  - Start librium 25mg  TID, monitor for oversedation - Low threshold to consult PCCM if CIWAs are high despite benzos - Thiamine, folic acid, multivitamin  Thrombocytopenia - Platelets 43 on admission. Decreased to 26 today. Chronic thrombocytopenia 2/2 alcohol use and cirrhosis. Plan - Trend CBC. Transfuse platelets if <10 or signs of bleeding.   Back Pain - Chronic low back pain present since last hospitalization, although more severe today. Low suspicion of retroperitoneal bleed. Hb stable.  Plan - Lidocaine patch  - Tylenol prn (no more than 2g daily) - Low threshold for imaging if pain worsens, patient becomes hemodynamically unstable, or Hb drops unexpectedly  Seizures:continue home phenytoin 300mg  daily, levels undetectable at admission Migraines:continue home  topamax 50mg  daily Anxiety: holding home hydroxyzine 25mg  q6hrs prn, buspar 10mg  BID, and trazodone 50mg  qhs prngiven AMS upon arrival  Dispo: Anticipated discharge in  approximately 4-5 day(s).   Briana Sweeney, Briana Elk, MD 12/06/2018, 6:41 AM Pager: [phone]

## 2018-12-06 NOTE — Plan of Care (Signed)

## 2018-12-06 NOTE — Progress Notes (Signed)
Lab called with a critical lab value: Platelet is 26. MD notified.

## 2018-12-07 DIAGNOSIS — M549 Dorsalgia, unspecified: Secondary | ICD-10-CM

## 2018-12-07 DIAGNOSIS — F10239 Alcohol dependence with withdrawal, unspecified: Secondary | ICD-10-CM

## 2018-12-07 DIAGNOSIS — D72819 Decreased white blood cell count, unspecified: Secondary | ICD-10-CM

## 2018-12-07 LAB — COMPREHENSIVE METABOLIC PANEL
ALT: 25 U/L (ref 0–44)
AST: 57 U/L — ABNORMAL HIGH (ref 15–41)
Albumin: 3 g/dL — ABNORMAL LOW (ref 3.5–5.0)
Alkaline Phosphatase: 145 U/L — ABNORMAL HIGH (ref 38–126)
Anion gap: 13 (ref 5–15)
BUN: 6 mg/dL (ref 6–20)
CO2: 20 mmol/L — ABNORMAL LOW (ref 22–32)
Calcium: 8.6 mg/dL — ABNORMAL LOW (ref 8.9–10.3)
Chloride: 103 mmol/L (ref 98–111)
Creatinine, Ser: 0.64 mg/dL (ref 0.44–1.00)
GFR calc Af Amer: >60 mL/min (ref >60)
GFR calc non Af Amer: >60 mL/min (ref >60)
Glucose, Bld: 116 mg/dL — ABNORMAL HIGH (ref 70–99)
Potassium: 3.7 mmol/L (ref 3.5–5.1)
Sodium: 136 mmol/L (ref 135–145)
Total Bilirubin: 1.8 mg/dL — ABNORMAL HIGH (ref 0.3–1.2)
Total Protein: 7.4 g/dL (ref 6.5–8.1)

## 2018-12-07 LAB — CBC
HCT: 40.6 % (ref 36.0–46.0)
HCT: 39.6 % (ref 36.0–46.0)
Hemoglobin: 13.2 g/dL (ref 12.0–15.0)
Hemoglobin: 13.4 g/dL (ref 12.0–15.0)
MCH: 29.9 pg (ref 26.0–34.0)
MCH: 31 pg (ref 26.0–34.0)
MCHC: 32.5 g/dL (ref 30.0–36.0)
MCHC: 33.8 g/dL (ref 30.0–36.0)
MCV: 92.1 fL (ref 80.0–100.0)
MCV: 91.7 fL (ref 80.0–100.0)
Platelets: 13 10*3/uL — CL (ref 150–400)
Platelets: 11 10*3/uL — CL (ref 150–400)
RBC: 4.41 MIL/uL (ref 3.87–5.11)
RBC: 4.32 MIL/uL (ref 3.87–5.11)
RDW: 17.7 % — ABNORMAL HIGH (ref 11.5–15.5)
RDW: 17.7 % — ABNORMAL HIGH (ref 11.5–15.5)
WBC: 1.8 10*3/uL — ABNORMAL LOW (ref 4.0–10.5)
WBC: 1.7 10*3/uL — ABNORMAL LOW (ref 4.0–10.5)
nRBC: 0 % (ref 0.0–0.2)
nRBC: 0 % (ref 0.0–0.2)

## 2018-12-07 MED ORDER — LORAZEPAM 2 MG/ML IJ SOLN: 1.0000 mg | INTRAMUSCULAR | Status: DC | PRN

## 2018-12-07 MED ORDER — LORAZEPAM 2 MG/ML IJ SOLN
1.0000 mg | INTRAMUSCULAR | Status: DC | PRN
  Filled 2018-12-07: qty 1

## 2018-12-07 MED ORDER — LORAZEPAM 2 MG/ML IJ SOLN
1.0000 mg | INTRAMUSCULAR | Status: DC | PRN
  Administered 2018-12-07: 1 mg via INTRAVENOUS
  Filled 2018-12-07: qty 1

## 2018-12-07 MED ORDER — CHLORDIAZEPOXIDE HCL 25 MG PO CAPS
25.0000 mg | ORAL_CAPSULE | Freq: Two times a day (BID) | ORAL | Status: AC
  Administered 2018-12-07 (×2): 25 mg via ORAL
  Filled 2018-12-07 (×2): qty 1

## 2018-12-07 MED ORDER — KETOROLAC TROMETHAMINE 10 MG PO TABS
10.0000 mg | ORAL_TABLET | Freq: Once | ORAL | Status: AC
  Administered 2018-12-07: 10 mg via ORAL
  Filled 2018-12-07: qty 1

## 2018-12-07 MED ORDER — LORAZEPAM 2 MG/ML IJ SOLN
1.0000 mg | INTRAMUSCULAR | Status: DC | PRN
  Administered 2018-12-07: 1 mg via INTRAVENOUS

## 2018-12-07 MED ORDER — LORAZEPAM 2 MG/ML IJ SOLN
1.0000 mg | INTRAMUSCULAR | Status: DC | PRN
  Administered 2018-12-07 – 2018-12-08 (×7): 1 mg via INTRAVENOUS
  Filled 2018-12-07 (×7): qty 1

## 2018-12-07 NOTE — Progress Notes (Signed)
Patient complaining of continuing headache. Also complaining of back pain unrelieved by Ativan. Patient states she does not want to take tylenol. Notified MD.

## 2018-12-07 NOTE — Progress Notes (Signed)
Subjective: No overnight events. Briana Sweeney is more somnolent today than yesterday. She is oriented. She states that she continues to have a headache, abdominal pain, and back pain. The pain has not changed since admission to the hospital. It was explained to her that she is at risk of spontaneous bleed because of her low platelet count. She agreed to let the medical team know if her pain worsens in any area. She continues to feel symptoms of alcohol withdrawal including shakiness and diaphoresis. The headache pain is in her eyes and she has persistent blurry vision. She states she vomited yesterday, but is unsure whether there was blood in her vomit. She states her stools were dark.  Objective:  Vital signs in last 24 hours: Vitals:   12/06/18 2018 12/06/18 2347 12/07/18 0200 12/07/18 0412  BP: 119/90  (!) 136/91   Pulse: 84     Resp:      Temp: 98.6 F (37 C) 98.3 F (36.8 C)  (!) 97.5 F (36.4 C)  TempSrc: Oral Oral  Oral  SpO2: 96%     Weight:       Gen: somnolent, oriented x3, follows commands Neuro: Nystagmus on lateral gaze. Patient does have difficulty tracking, but this seems to be due to somnolence rather than neurologic dysfunction. Tremulous. 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 Phylicia Mcgaugh is a 48 year old femalewith a history of alcoholic cirrhosis,hepatitis c s/p treatment, chronic thrombocytopenia, alcohol use disorder,prior alcohol withdrawals, seizures, migraines,depression, andschizophrenia who presented to the ED withaltered mental status.CT head was negative. Ethanol level was >300.  Acute alcoholic encephalopathy Alcoholic cirrhosis  - Remains alert and oriented, however she is much more somnolent today, likely 2/2 to frequent benzodiazepine dosing for elevated CIWAs. Nystagmus and tremors  still present. Ddx includes alcohol withdrawal, hepatic encephalopathy, Wernicke's encephalopathy, and cerebellar degeneration 2/2 chronic etoh use. - AST is persistently elevated, ALT wnl. ALP is increased from 138 to 145. Total bili is increased from 1.3 to 1.8.  Plan -Continue home lactulose  - Thiamine 500 mg IV TID for one more day. Followed by 250mg  IV for 5 days. - De-escalate bzd administration as below  Alcohol Use Disorder - Heavy daily drinker. Has required ICU and precedex drip for alcohol withdrawal in the past.High risk for withdrawals. - Patient is actively withdrawing. CIWAs ranged 11-14 overnight, requiring 2mg  ativan x7. Will de-escalate withdrawal treatment protocol given somnolence. Plan -Decrease ativan frequency and dose with CIWA monitoring - Wean librium to 25mg  BID today. - Low threshold to consult PCCM if CIWAs are high despite benzos - Thiamine, folic acid, multivitamin  Chronic thrombocytopenia and leukopenia 2/2 etoh - Platelets 43 on admission, decreased to 13 today. Leukopenic at 1.8 today. - No concern for acute bleeding at this time as her headache, back pain, and abdominal pain are stable. Low threshold to obtain imaging if nature or severity of pain changes. - If patient fevers, she will need broad spectrum abx including pseudomonal coverage based on her leukopenia Plan - Repeat CBC this afternoon. Transfuse platelets if <10 or signs of bleeding.   Back Pain - Stable. Hb stable. - Lidocaine patch  - Tylenol prn (no more than 2g daily)  Seizures:continue home phenytoin 300mg  daily, levels undetectable at admission Migraines:continue home topamax 50mg  daily Anxiety: continue home prozac;holdinghome hydroxyzine 25mg  q6hrs  prn, buspar 10mg  BID, and trazodone 50mg  qhs prngiven AMS upon arrival  Dispo: Anticipated discharge in approximately 3-4 days. Quamaine Webb, Andree Elk, MD 12/07/2018, 6:45 AM Pager: [phone]

## 2018-12-07 NOTE — Progress Notes (Signed)
Pharmacy is having trouble verifying medications the patient was taking prior to admission. The patient is unable. Her sister is unable. It appears there are three different pharmacies she uses. We are having trouble getting through to them to verify the last time medications were filled.   Romeo Rabon, PharmD. Mobile: [phone]. 12/07/2018,12:17 PM.

## 2018-12-08 DIAGNOSIS — R140 Abdominal distension (gaseous): Secondary | ICD-10-CM

## 2018-12-08 DIAGNOSIS — F10232 Alcohol dependence with withdrawal with perceptual disturbance: Secondary | ICD-10-CM

## 2018-12-08 LAB — CBC
HCT: 38.3 % (ref 36.0–46.0)
Hemoglobin: 13 g/dL (ref 12.0–15.0)
MCH: 31 pg (ref 26.0–34.0)
MCHC: 33.9 g/dL (ref 30.0–36.0)
MCV: 91.4 fL (ref 80.0–100.0)
Platelets: 10 10*3/uL — CL (ref 150–400)
RBC: 4.19 MIL/uL (ref 3.87–5.11)
RDW: 17.8 % — ABNORMAL HIGH (ref 11.5–15.5)
WBC: 2.5 10*3/uL — ABNORMAL LOW (ref 4.0–10.5)
nRBC: 0 % (ref 0.0–0.2)

## 2018-12-08 LAB — COMPREHENSIVE METABOLIC PANEL
ALT: 24 U/L (ref 0–44)
AST: 45 U/L — ABNORMAL HIGH (ref 15–41)
Albumin: 3.1 g/dL — ABNORMAL LOW (ref 3.5–5.0)
Alkaline Phosphatase: 135 U/L — ABNORMAL HIGH (ref 38–126)
Anion gap: 9 (ref 5–15)
BUN: <5 mg/dL — ABNORMAL LOW (ref 6–20)
CO2: 19 mmol/L — ABNORMAL LOW (ref 22–32)
Calcium: 8.9 mg/dL (ref 8.9–10.3)
Chloride: 102 mmol/L (ref 98–111)
Creatinine, Ser: 0.7 mg/dL (ref 0.44–1.00)
GFR calc Af Amer: >60 mL/min (ref >60)
GFR calc non Af Amer: >60 mL/min (ref >60)
Glucose, Bld: 108 mg/dL — ABNORMAL HIGH (ref 70–99)
Potassium: 3 mmol/L — ABNORMAL LOW (ref 3.5–5.1)
Sodium: 130 mmol/L — ABNORMAL LOW (ref 135–145)
Total Bilirubin: 1.6 mg/dL — ABNORMAL HIGH (ref 0.3–1.2)
Total Protein: 7.9 g/dL (ref 6.5–8.1)

## 2018-12-08 LAB — CBC WITH DIFFERENTIAL/PLATELET
Abs Immature Granulocytes: 0.01 10*3/uL (ref 0.00–0.07)
Basophils Absolute: 0 10*3/uL (ref 0.0–0.1)
Basophils Relative: 0 %
Eosinophils Absolute: 0 10*3/uL (ref 0.0–0.5)
Eosinophils Relative: 1 %
HCT: 42.1 % (ref 36.0–46.0)
Hemoglobin: 14.1 g/dL (ref 12.0–15.0)
Immature Granulocytes: 1 %
Lymphocytes Relative: 11 %
Lymphs Abs: 0.1 10*3/uL — ABNORMAL LOW (ref 0.7–4.0)
MCH: 31.1 pg (ref 26.0–34.0)
MCHC: 33.5 g/dL (ref 30.0–36.0)
MCV: 92.7 fL (ref 80.0–100.0)
Monocytes Absolute: 0 10*3/uL — ABNORMAL LOW (ref 0.1–1.0)
Monocytes Relative: 2 %
Neutro Abs: 1 10*3/uL — ABNORMAL LOW (ref 1.7–7.7)
Neutrophils Relative %: 85 %
Platelets: 11 10*3/uL — CL (ref 150–400)
RBC: 4.54 MIL/uL (ref 3.87–5.11)
RDW: 18 % — ABNORMAL HIGH (ref 11.5–15.5)
WBC: 1.2 10*3/uL — CL (ref 4.0–10.5)
nRBC: 0 % (ref 0.0–0.2)

## 2018-12-08 MED ORDER — POTASSIUM CHLORIDE CRYS ER 20 MEQ PO TBCR
40.0000 meq | EXTENDED_RELEASE_TABLET | Freq: Two times a day (BID) | ORAL | Status: AC
  Administered 2018-12-08 (×2): 40 meq via ORAL
  Filled 2018-12-08 (×2): qty 2

## 2018-12-08 MED ORDER — SODIUM CHLORIDE 0.9% IV SOLUTION
Freq: Once | INTRAVENOUS | Status: AC
  Administered 2018-12-08: 08:00:00 via INTRAVENOUS

## 2018-12-08 MED ORDER — LACTULOSE 10 GM/15ML PO SOLN
20.0000 g | Freq: Three times a day (TID) | ORAL | Status: DC
  Administered 2018-12-08: 20 g via ORAL
  Administered 2018-12-08 – 2018-12-09 (×2): 10 g via ORAL
  Administered 2018-12-12 – 2018-12-14 (×6): 20 g via ORAL
  Filled 2018-12-08 (×13): qty 30

## 2018-12-08 MED ORDER — CHLORDIAZEPOXIDE HCL 25 MG PO CAPS
25.0000 mg | ORAL_CAPSULE | Freq: Once | ORAL | Status: AC
  Administered 2018-12-08: 25 mg via ORAL
  Filled 2018-12-08: qty 1

## 2018-12-08 MED ORDER — LORAZEPAM 2 MG/ML IJ SOLN
1.0000 mg | INTRAMUSCULAR | Status: DC | PRN
  Administered 2018-12-08 – 2018-12-09 (×5): 1 mg via INTRAVENOUS
  Filled 2018-12-08 (×6): qty 1

## 2018-12-08 MED ORDER — LACTULOSE 10 GM/15ML PO SOLN
20.0000 g | Freq: Two times a day (BID) | ORAL | Status: DC
  Administered 2018-12-08: 20 g via ORAL
  Filled 2018-12-08: qty 30

## 2018-12-08 MED ORDER — THIAMINE HCL 100 MG/ML IJ SOLN
250.0000 mg | Freq: Every day | INTRAVENOUS | Status: AC
  Administered 2018-12-08 – 2018-12-11 (×4): 250 mg via INTRAVENOUS
  Filled 2018-12-08 (×7): qty 2.5

## 2018-12-08 NOTE — Progress Notes (Signed)
MD notified patient more agitated with telemetry monitor attached. Monitor currently on standby. MD stated they were okay with that.

## 2018-12-08 NOTE — Discharge Summary (Signed)
Name: Briana Sweeney MRN: [id number] DOB: 08-14-1971 48 y.o. PCP: [name], FNP  Date of Admission: 12/05/2018 12:56 PM Date of Discharge: 12/14/18 Attending Physician: Dareen Piano   Discharge Diagnosis: 1. 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 Alcohol withdrawal 3. 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   FLUoxetine 40 MG capsule Commonly known as:  PROZAC Take 1 capsule (40 mg total) by mouth daily. What changed:  Another medication with the same name was removed. Continue taking this medication, and follow the directions you see here.   folic acid 1 MG tablet Commonly known as:  FOLVITE Take 1 tablet (1 mg total) by mouth daily.   gabapentin 300 MG capsule Commonly known as:  NEURONTIN Take 2 capsules 2 times a day. What changed:    how much to take  how to take this  when to take this  additional instructions   haloperidol 2 MG tablet Commonly known as:  HALDOL Take 2 mg by mouth at bedtime.   hydrOXYzine 25 MG tablet Commonly known as:  ATARAX/VISTARIL Take 1 tablet (25 mg total) by mouth every 6 (six) hours as needed for itching.   lactulose 10 GM/15ML solution Commonly known as:  CHRONULAC Take 30 mLs (20 g total) by mouth 3 (three) times daily. Goal to have 2-3 BMs daily What changed:    when to take this  additional instructions   multivitamin with minerals Tabs tablet Take 1 tablet by mouth daily.   naltrexone 50 MG  tablet Commonly known as:  DEPADE Take 1 tablet (50 mg total) by mouth daily.   phenytoin 300 MG ER capsule Commonly known as:  DILANTIN Take 1 capsule (300 mg total) by mouth daily.   thiamine 100 MG tablet Take 1 tablet (100 mg total) by mouth daily.   topiramate 50 MG tablet Commonly known as:  TOPAMAX Take 1 tablet (50 mg total) by mouth daily.   traZODone 50 MG tablet Commonly known as:  DESYREL Take 1 tablet (50 mg total) by mouth at bedtime as needed for sleep. What changed:    how much to take  when to take this       Disposition and follow-up:   [name] was discharged from [facility] in Stable condition.  At the hospital follow up visit please address:  1. 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 Alcohol withdrawal - prescribed naltrexone at discharge, f/u this new medication  3. 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  Labs / imaging needed at time of follow-up: CBC  3.  Pending labs/ test needing follow-up: none  Follow-up Appointments: Follow-up Information    Care, [facility] Follow up.  Specialty:  [place] Why:  home health services arranged, PT,OT Contact information: [place] [address] [phone]        [name], FNP. Go to.   Specialty:  Family Medicine Why:  Your already scheduled appts: 12/17/18 lab draw 10am 12/22/18 in person visit with Diona Foley information: [place] [address] [place] by problem list: 1. 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 is a 48 year old femalewith a history of  alcoholic cirrhosis,hepatitis c s/p treatment, chronic thrombocytopenia, alcohol use disorder,prior alcohol withdrawal, seizures, migraines,depression, andschizophrenia who presented to the ED withaltered mental status.CT headwasnegative. Ethanol level was >300. Initial neuro exam showed somnolence, nystagmus, and ataxia. She was treated with high dose IV thiamine for possible Wernicke's. She was started on lactulose as she had not been it at home. Her mental status improved with these therapies.  2. Alcohol withdrawal: Reportedly drinks 1/2 gallon of vodka daily. Went through withdrawal while hospitalized. This was managed with librium and ativan per CIWA protocol. Valium was then added to manage anxiety while inpatient and prevent her from leaving [place]. Alcohol cessation counseling performed and naltrexone prescribed at discharge.   3. 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 Her platelet count was 14 and her white count was 2.2 at the time of discharge. She will need f/u with PCP to monitor blood cell counts.   Discharge Vitals:   BP 101/72 (BP Location: Left Arm)   Pulse 62   Temp 98 F (36.7 C) (Oral)   Resp 18   Ht 5' (1.524 m)   Wt 59.9 kg   SpO2 93%   BMI 25.79 kg/m   Pertinent Labs, Studies, and Procedures:  CBC Latest Ref Rng & Units 12/14/2018 12/13/2018 12/12/2018  WBC 4.0 - 10.5 K/uL 2.2(L) 2.3(L) 2.6(L)  Hemoglobin 12.0 - 15.0 g/dL 13.2 13.0 13.2  Hematocrit 36.0 - 46.0 % 40.8 39.5 40.2  Platelets 150 - 400 K/uL 14(LL) 12(LL) 12(LL)   CMP Latest Ref Rng & Units 12/10/2018 12/09/2018 12/08/2018  Glucose 70 - 99 mg/dL 95 91 108(H)  BUN 6 - 20 mg/dL 6 <5(L) <5(L)  Creatinine 0.44 - 1.00 mg/dL 0.65 0.79 0.70  Sodium 135 - 145 mmol/L 137 136 130(L)  Potassium 3.5 - 5.1 mmol/L 3.8 4.1 3.0(L)  Chloride 98 - 111 mmol/L 110 110 102  CO2 22 - 32 mmol/L 19(L) 19(L) 19(L)  Calcium 8.9 - 10.3 mg/dL 8.8(L) 8.7(L) 8.9  Total  Protein 6.5 - 8.1 g/dL 7.2 7.3 7.9  Total Bilirubin 0.3 - 1.2 mg/dL 0.8 1.4(H) 1.6(H)  Alkaline Phos 38 - 126 U/L 142(H) 129(H) 135(H)  AST 15 - 41 U/L 36 48(H) 45(H)  ALT 0 - 44 U/L 19 23 24    MRI brain 12/06/2018 1. No acute intracranial abnormality. No MRI evidence for Wernicke encephalopathy. 2. Increased T1 hyperintensity involving the basal ganglia bilaterally, likely due to manganese deposition related to underlying alcoholic encephalopathy. 3. Mildly advanced cerebral atrophy for age.  US Abdomen 12/06/2018 1. Cirrhotic hepatic morphology. No focal lesion demonstrated sonographically. No ascites. 2. Gallstone without sonographic findings of cholecystitis.  Discharge Instructions: Discharge Instructions    Diet - low sodium heart healthy   Complete by:  As directed    Discharge instructions   Complete by:  As directed    [name],  You were admitted to the hospital because your platelets were dangerously low and you were also confused.  Both of these things are due to drinking too much alcohol.  Encourage you to continue seeking help to try and decrease your amount of alcohol use with the hopes of stopping altogether.  I have sent a prescription for naltrexone to your pharmacy.  This medicine is meant to take once every day and will decrease your cravings for alcohol.  It is not a medicine you will become addicted to you.  The reason you were confused is because the alcohol has damaged her liver and your body is not able to get rid of different toxins has quickly as it normally when it.  This is why we started you on lactulose to increase the number of bowel movements you are having.  This will help your body get rid of those toxins.  Please increase the dose of lactulose in order to have 2-3 bowel movements every day.  I have sent this medicine to your pharmacy.  It is important to follow-up closely with your primary care provider.  I have called [facility] office and  scheduled a visit this Thursday for a lab draw.  You can go anytime between 8 AM and 12 PM.  You will then see Lanelle Bal in person next Tuesday, April 14.  If you notice increased bruising or any bleeding, please come back to the hospital.  If you have worsening confusion abdominal pain, nausea or vomiting please come back to the hospital.   Increase activity slowly   Complete by:  As directed       Signed: Isabelle Course, MD 12/14/2018, 9:39 AM   Pager: [phone]

## 2018-12-08 NOTE — Progress Notes (Signed)
   Subjective: Ms. Cubillos notes she is having visual hallucinations, particularly of 2 people in the mirror that are whispering. Endorses dizziness, decreased appetite, and diffuse abdominal pain. Continues to have headache behind both eyes which is unchanged from admission. Moderate relief after taking tylenol. Endorses decreased BMs. She is frustrated because she cannot sleep. Discussed plan for platelet transfusion today. All questions and concerns were addressed.   Objective:  Vital signs in last 24 hours: Vitals:   12/07/18 1413 12/07/18 2013 12/07/18 2349 12/08/18 0501  BP: 111/83 (!) 120/94 (!) 117/94 (!) 129/103  Pulse:  86 (!) 103 100  Resp:  15 15 15   Temp: 98 F (36.7 C) 98 F (36.7 C) 97.8 F (36.6 C) 97.8 F (36.6 C)  TempSrc: Oral Oral Oral Oral  SpO2:  96% 97% 99%  Weight:       General: awake, alert, sitting up in bed in NAD Neuro: nystagmus on lateral gaze. Mental status improved from yesterday. Follows commands and engages appropriately. No tremor today. No asterixis. CV: RRR; no murmurs Pulm: CTAB, normal effort on room air.  Abd: bowel sounds present, soft, distended, diffuse ttp Ext: no edema  Assessment/Plan:  Active Problems:   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[place])  Ms. Hilari Wethington is a 48 year old femalewith a history of alcoholic cirrhosis,hepatitis c s/p treatment, chronic thrombocytopenia, alcohol use disorder,prior alcohol withdrawals, seizures, migraines,depression, andschizophrenia who presented to the ED withaltered mental status.CT headwasnegative. Ethanol level was >300.   Acute encephalopathy Alcoholic cirrhosis  -Alert and oriented. Nystagmus and tremors still present.Ddx includesalcohol withdrawal, hepatic encephalopathy, Wernicke's encephalopathy, and cerebellar degeneration 2/2 chronic etoh use. - Liver labs have downtrended today. - Patient has not been having bowel movements with current lactulose dose. Will increase today. Her  current abdominal pain and distension is likely 2/2 constipation despite daily lactulose. Plan -Continue home lactulose, titrated to 2-3 BMs daily  - Thiamine 250mg  IV for 3-5 more days depending on clinical improvement - PT/OT eval for ataxia  Alcohol Use Disorder -Heavy daily drinker. Has required ICU and precedex drip for alcohol withdrawal in the past.High risk for withdrawals. - Patient is actively withdrawing and endorses hallucinations. However, she appears improved with resolution of her. Last drink was 4 days ago. CIWAs ranged 6-12 overnight, requiring 1mg  ativan x7 over the past 24 hrs. 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 - No concern for acute bleeding at this time as her headache, back pain, and abdominal pain are stable. Low threshold to obtain imaging if nature or severity of pain changes. - If patient fevers, she will need broad spectrum abx including pseudomonal coverage based on her leukopenia Plan - Transfuse 1u platelets. Repeat CBC post-transfusion. Transfuse for platelets <10  Seizures:continue home phenytoin 300mg  daily, levels undetectable at admission Migraines:continue home topamax 50mg  daily Anxiety: continue home prozac;holdinghome hydroxyzine 25mg  q6hrs prn, buspar 10mg  BID, and trazodone 50mg  qhs prngiven AMS upon arrival  Dispo: Anticipated discharge in approximately 2-3 days.   Niccole Witthuhn, Andree Elk, MD 12/08/2018, 6:39 AM Pager: [phone]

## 2018-12-09 DIAGNOSIS — F10229 Alcohol dependence with intoxication, unspecified: Secondary | ICD-10-CM

## 2018-12-09 LAB — COMPREHENSIVE METABOLIC PANEL
ALT: 23 U/L (ref 0–44)
AST: 48 U/L — ABNORMAL HIGH (ref 15–41)
Albumin: 2.8 g/dL — ABNORMAL LOW (ref 3.5–5.0)
Alkaline Phosphatase: 129 U/L — ABNORMAL HIGH (ref 38–126)
Anion gap: 7 (ref 5–15)
BUN: <5 mg/dL — ABNORMAL LOW (ref 6–20)
CO2: 19 mmol/L — ABNORMAL LOW (ref 22–32)
Calcium: 8.7 mg/dL — ABNORMAL LOW (ref 8.9–10.3)
Chloride: 110 mmol/L (ref 98–111)
Creatinine, Ser: 0.79 mg/dL (ref 0.44–1.00)
GFR calc Af Amer: >60 mL/min (ref >60)
GFR calc non Af Amer: >60 mL/min (ref >60)
Glucose, Bld: 91 mg/dL (ref 70–99)
Potassium: 4.1 mmol/L (ref 3.5–5.1)
Sodium: 136 mmol/L (ref 135–145)
Total Bilirubin: 1.4 mg/dL — ABNORMAL HIGH (ref 0.3–1.2)
Total Protein: 7.3 g/dL (ref 6.5–8.1)

## 2018-12-09 LAB — CBC
HCT: 39.7 % (ref 36.0–46.0)
Hemoglobin: 13.2 g/dL (ref 12.0–15.0)
MCH: 31.1 pg (ref 26.0–34.0)
MCHC: 33.2 g/dL (ref 30.0–36.0)
MCV: 93.4 fL (ref 80.0–100.0)
Platelets: 12 10*3/uL — CL (ref 150–400)
RBC: 4.25 MIL/uL (ref 3.87–5.11)
RDW: 19 % — ABNORMAL HIGH (ref 11.5–15.5)
WBC: 3.9 10*3/uL — ABNORMAL LOW (ref 4.0–10.5)
nRBC: 0 % (ref 0.0–0.2)

## 2018-12-09 LAB — PREPARE PLATELET PHERESIS: Unit division: 0

## 2018-12-09 LAB — BPAM PLATELET PHERESIS
Blood Product Expiration Date: 202004022359
ISSUE DATE / TIME: 202003310752
Unit Type and Rh: 7300

## 2018-12-09 MED ORDER — LORAZEPAM 2 MG/ML IJ SOLN
1.0000 mg | INTRAMUSCULAR | Status: DC | PRN
  Administered 2018-12-09 – 2018-12-12 (×7): 1 mg via INTRAVENOUS
  Filled 2018-12-09 (×7): qty 1

## 2018-12-09 NOTE — Progress Notes (Signed)
   Subjective: HD#4   Briana Sweeney reports that she was able to sleep better and endorses improvement with visual hallucination. She had 1 BM overnight. She also continues to endorse lower back pain though not severe and is unchanged from baseline. She still has upper extremity tremors which is improved. Admits that she "had a seizure" yesterday which was not reported nor witnessed by nursing staff She describes it as her eyes "going crazy, moving back and forth." She continues to have intermittent double vision. Overall, discussed that she is improving but will need a few more days of monitoring her alcohol withdrawal symptoms and low platelets.   Objective:  Vital signs in last 24 hours: Vitals:   12/08/18 0940 12/08/18 2120 12/09/18 0931 12/09/18 0933  BP: 115/84 104/78 93/87 (!) 87/71  Pulse: (!) 106 (!) 103  82  Resp: 17 20    Temp: 98 F (36.7 C) 98 F (36.7 C)  97.7 F (36.5 C)  TempSrc: Oral Oral  Oral  SpO2: 99% 97%  98%  Weight:       Gen: calm, NAD Resp: CTAB CV: RRR, no m/r/g Neuro: Alert and oriented, slight tremor R>L hand with outstretched arms, no asterixis, no nystagmus  Assessment/Plan:  Active Problems:   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[facility])   Briana Sweeney is a 48 year old femalewith a history of alcoholic cirrhosis,hepatitis c s/p treatment, chronic thrombocytopenia, alcohol use disorder,prior alcohol withdrawals, seizures, migraines,depression, andschizophrenia who presented to the ED withaltered mental status.CT headwasnegative. Ethanol level was >300.   Acute encephalopathy Alcoholic cirrhosis -Alert and oriented. Nystagmus has resolved but she continues to have slight tremor of R>L hand.Ddx includesalcohol withdrawal, hepatic encephalopathy, Wernicke's encephalopathy, and cerebellar degeneration 2/2 chronic etoh use. - Liver labs continue to downtrend. - One BM after increasing lactulose to TID, will continue TID dosing Plan -Continue TID  lactulose, titrated to 2-3 BMs daily  - Thiamine 250mg  IV for 2-4 more days depending on clinical improvement - PT recommending HHPT with 24/7 supervision, will c/s case management  Alcohol Use Disorder -Heavy daily drinker. Has required ICU and precedex drip for alcohol withdrawal in the past.High risk for withdrawals. -Patient is actively withdrawing and endorses hallucinations. However, she appears improved with resolution of her nystagmus and improvement of tremors and CIWA scores. Last drink was 3/27.Only one elevated CIWA overnight of 10.  Plan -Continue ativan 1mg  q2hrs prn for CIWA >9 -s/p librium 3 day librium taper. Will discontinue now given clinical improvement - Low threshold to consult PCCMif CIWAs are high despite benzos - Thiamine, folic acid, multivitamin  Chronic thrombocytopeniaand leukopenia 2/2 etoh -Platelets43 on admission, decreased to 10. S/p 1U platelets. Platelets 12 this morning. Leukopenia also improving from 1.2 to 3.9. - No concern for acute bleeding at this time as her headache, back pain, and abdominal pain are stable. Low threshold to obtain imaging if nature or severity of pain changes. - If patient fevers, she will need broad spectrum abx including pseudomonal coverage based on her leukopenia Plan -Daily CBCs. Transfuse for platelets <10  Seizures:continue home phenytoin 300mg  daily, levels undetectable at admission Migraines:continue home topamax 50mg  daily Anxiety:continue home prozac;holdinghome hydroxyzine 25mg  q6hrs prn, buspar 10mg  BID, and trazodone 50mg  qhs prngiven AMS upon arrival  Dispo: Anticipated discharge in approximately 1-2 day(s).   Isabelle Course, MD 12/09/2018, 10:11 AM Pager: [phone] IMTS PGY-1

## 2018-12-09 NOTE — Progress Notes (Signed)
IV inserted into right hand.Consult placed to IV team. IV positional. Patient asking for PRN medication for anxiety more frequently. Made physician aware during AM round

## 2018-12-09 NOTE — Evaluation (Signed)
Physical Therapy Evaluation Patient Details Name: Briana Sweeney MRN: [id number] DOB: [birth year] Today's Date: 12/09/2018   History of Present Illness  48yo female presenting to the ED with AMS, HA. CT head and CXR negative. Note recent admission for GI bleed, alcoholic cirrhosis, and thrombocytopenia. Admitted for acute alcoholic encephalopathy. PMH alcoholic cirrhosis, hepatitis, thrombocytopenia, seizures, alcohol disorder, schizophrenia, bipolar disorder  Clinical Impression   Patient received long sitting in bed, pleasant and willing to participate in PT but impulsive throughout session. Able to complete bed mobility with Mod(I) however requires min guard for functional transfers and gait in room with no device. Gait pattern with shorter step lengths, impaired coordination, and very mild ataxia with steps but steady with min guard for safety. Declined gait outside of room due to dizziness, BP found to be 93/87 in sitting at EOB. Reports some visual deficits but did not allow/declined PT visual screen today. She was left in bed with all needs met and bed alarm active. She will continue to benefit from skilled PT services in the acute setting, also recommend skilled HHPT services with 24/7 S moving forward.     Follow Up Recommendations Home health PT;Supervision/Assistance - 24 hour    Equipment Recommendations  None recommended by PT    Recommendations for Other Services       Precautions / Restrictions Precautions Precautions: Fall;Other (comment) Precaution Comments: on and off agitation per nursing notes  Restrictions Weight Bearing Restrictions: No      Mobility  Bed Mobility Overal bed mobility: Modified Independent             General bed mobility comments: no physical assist given  Transfers Overall transfer level: Needs assistance Equipment used: None Transfers: Sit to/from Stand Sit to Stand: Min guard         General transfer comment: min gaurd for safety,  light correction for balance   Ambulation/Gait Ambulation/Gait assistance: Min guard Gait Distance (Feet): 20 Feet Assistive device: None Gait Pattern/deviations: Step-through pattern;Decreased step length - right;Decreased step length - left;Decreased stride length;Decreased dorsiflexion - left;Decreased dorsiflexion - right;Narrow base of support Gait velocity: decreased    General Gait Details: slow gait pattern with narrow BOS, min guard for safety and steadying; refused ambulation out of room   Stairs            Wheelchair Mobility    Modified Rankin (Stroke Patients Only)       Balance Overall balance assessment: Needs assistance;History of Falls Sitting-balance support: No upper extremity supported;Feet supported Sitting balance-Leahy Scale: Good     Standing balance support: During functional activity Standing balance-Leahy Scale: Fair                               Pertinent Vitals/Pain Pain Assessment: 0-10 Pain Score: 8  Pain Location: back  Pain Descriptors / Indicators: Aching;Discomfort Pain Intervention(s): Limited activity within patient's tolerance;Monitored during session;Repositioned    Home Living Family/patient expects to be discharged to:: Private residence Living Arrangements: Other (Comment)(boyfriend who works nights ) Available Help at Discharge: Available PRN/intermittently;Friend(s) Type of Home: House Home Access: Stairs to enter   [employer] of Steps: states "a lot but I can stay downstairs and do not have to do stairs there"  Home Layout: One level Home Equipment: None      Prior Function Level of Independence: Independent         Comments: ambulatory with occasional falls due to seizure per  her report      Hand Dominance        Extremity/Trunk Assessment   Upper Extremity Assessment Upper Extremity Assessment: Defer to OT evaluation    Lower Extremity Assessment Lower Extremity  Assessment: Generalized weakness    Cervical / Trunk Assessment Cervical / Trunk Assessment: Normal  Communication   Communication: No difficulties  Cognition Arousal/Alertness: Awake/alert Behavior During Therapy: Flat affect;Impulsive Overall Cognitive Status: Impaired/Different from baseline Area of Impairment: Orientation;Attention;Memory;Following commands;Safety/judgement;Awareness;Problem solving                 Orientation Level: Person;Place;Time;Situation Current Attention Level: Sustained   Following Commands: Follows one step commands consistently;Follows multi-step commands inconsistently Safety/Judgement: Decreased awareness of safety Awareness: Intellectual Problem Solving: Slow processing;Requires verbal cues        General Comments      Exercises     Assessment/Plan    PT Assessment Patient needs continued PT services  PT Problem List Decreased strength;Decreased balance;Decreased knowledge of precautions;Decreased mobility;Decreased coordination;Decreased safety awareness;Decreased activity tolerance       PT Treatment Interventions DME instruction;Functional mobility training;Balance training;Patient/family education;Gait training;Therapeutic activities;Neuromuscular re-education;Stair training;Therapeutic exercise;Cognitive remediation    PT Goals (Current goals can be found in the Care Plan section)  Acute Rehab PT Goals Patient Stated Goal: none stated  PT Goal Formulation: With patient Time For Goal Achievement: 12/23/18 Potential to Achieve Goals: Fair    Frequency Min 3X/week   Barriers to discharge        Co-evaluation               AM-PAC PT "6 Clicks" Mobility  Outcome Measure Help needed turning from your back to your side while in a flat bed without using bedrails?: None Help needed moving from lying on your back to sitting on the side of a flat bed without using bedrails?: None Help needed moving to and from a bed to  a chair (including a wheelchair)?: A Little Help needed standing up from a chair using your arms (e.g., wheelchair or bedside chair)?: A Little Help needed to walk in hospital room?: A Little Help needed climbing 3-5 steps with a railing? : A Little 6 Click Score: 20    End of Session Equipment Utilized During Treatment: Gait belt Activity Tolerance: Patient tolerated treatment well Patient left: in bed;with bed alarm set;with call bell/phone within reach   PT Visit Diagnosis: Unsteadiness on feet (R26.81);Muscle weakness (generalized) (M62.81);History of falling (Z91.81)    Time: 3536-1443 PT Time Calculation (min) (ACUTE ONLY): 10 min   Charges:   PT Evaluation $PT Eval Moderate Complexity: 1 Mod          Deniece Ree PT, DPT, CBIS  Supplemental Physical Therapist [place]    Pager [phone] Acute Rehab Office [phone]

## 2018-12-09 NOTE — Evaluation (Signed)
Occupational Therapy Evaluation Patient Details Name: Briana Sweeney MRN: [id number] DOB: [birth year] Today's Date: 12/09/2018    History of Present Illness 48yo female presenting to the ED with AMS, HA. CT head and CXR negative. Note recent admission for GI bleed, alcoholic cirrhosis, and thrombocytopenia. Admitted for acute alcoholic encephalopathy. PMH alcoholic cirrhosis, hepatitis, thrombocytopenia, seizures, alcohol disorder, schizophrenia, bipolar disorder   Clinical Impression   Pt is a 48 yo female s/p above dx. . Pt currently with functional limitiations due to the deficits listed below (see OT problem list). Pt minguardA for mobility due to impulsivity and slight LOB episodes. Pt able to stand at sink for grooming with no SOB or increased HR. Pt's BP continues to be low 78/56 and pt does feel dizzy after standing. VISION to be tested in next sessions as pt was not feeling well enough to do so today.  Pt will benefit from skilled OT to increase their independence and safety with adls and balance to allow discharge to [facility] with 24/7 [place]. OT to follow acutely.     Follow Up Recommendations  Home health OT;Supervision/Assistance - 24 hour    Equipment Recommendations  Other (comment)(to be determined)    Recommendations for Other Services       Precautions / Restrictions Precautions Precautions: Fall;Other (comment)(CIWA, seizure d/o, low BP) Precaution Comments: on and off agitation per nursing notes  Restrictions Weight Bearing Restrictions: No      Mobility Bed Mobility Overal bed mobility: Modified Independent             General bed mobility comments: no physical assist given  Transfers Overall transfer level: Needs assistance Equipment used: None Transfers: Sit to/from Stand Sit to Stand: Min guard         General transfer comment: min gaurd for safety, light correction for balance     Balance Overall balance assessment: Needs assistance;History  of Falls Sitting-balance support: No upper extremity supported;Feet supported Sitting balance-Leahy Scale: Good     Standing balance support: During functional activity Standing balance-Leahy Scale: Fair                             ADL either performed or assessed with clinical judgement   ADL Overall ADL's : Needs assistance/impaired Eating/Feeding: Modified independent;Sitting   Grooming: Wash/dry hands;Wash/dry face;Oral care;Min guard;Standing   Upper Body Bathing: Set up;Sitting;Standing   Lower Body Bathing: Min guard;Sitting/lateral leans;Sit to/from stand   Upper Body Dressing : Set up;Sitting;Standing   Lower Body Dressing: Min guard;Sitting/lateral leans;Sit to/from stand   Toilet Transfer: Supervision/safety;RW   Toileting- Water quality scientist and Hygiene: Supervision/safety;Sitting/lateral lean;Sit to/from stand   Tub/ Banker: Min guard   Functional mobility during ADLs: Min guard General ADL Comments: Able to perform with supervisionA and minguardA in standing.     Vision Baseline Vision/History: No visual deficits Vision Assessment?: Vision impaired- to be further tested in functional context Additional Comments: reports blurry and double vision; unable to test as pt not feeling well once returning to bed,     Perception     Praxis      Pertinent Vitals/Pain Pain Assessment: 0-10 Pain Score: 8  Pain Location: back  Pain Descriptors / Indicators: Aching;Discomfort Pain Intervention(s): Limited activity within patient's tolerance;Monitored during session     Hand Dominance     Extremity/Trunk Assessment Upper Extremity Assessment Upper Extremity Assessment: Generalized weakness   Lower Extremity Assessment Lower Extremity Assessment: Generalized weakness   Cervical /  Trunk Assessment Cervical / Trunk Assessment: Normal   Communication Communication Communication: No difficulties   Cognition Arousal/Alertness:  Awake/alert Behavior During Therapy: Flat affect;Impulsive Overall Cognitive Status: Impaired/Different from baseline Area of Impairment: Orientation;Attention;Memory;Following commands;Safety/judgement;Awareness;Problem solving                 Orientation Level: Person;Place;Time;Situation Current Attention Level: Sustained   Following Commands: Follows one step commands consistently;Follows multi-step commands inconsistently Safety/Judgement: Decreased awareness of safety Awareness: Intellectual Problem Solving: Slow processing;Requires verbal cues General Comments: Pt followed all commands and answered questions appropriately   General Comments  BP 78/56 post exertion and dizziness.    Exercises     Shoulder Instructions      Home Living Family/patient expects to be discharged to:: Private residence Living Arrangements: Spouse/significant other Available Help at Discharge: Available PRN/intermittently;Friend(s) Type of Home: House Home Access: Stairs to enter [employer] of Steps: states "a lot but I can stay downstairs and do not have to do stairs there"    Home Layout: One level;Full bath on main level     Bathroom Shower/Tub: Tub/shower unit;Walk-in shower   Bathroom Toilet: Standard     Home Equipment: None          Prior Functioning/Environment Level of Independence: Independent        Comments: ambulatory with occasional falls due to seizure per her report         OT Problem List: Decreased strength;Decreased activity tolerance;Impaired balance (sitting and/or standing);Decreased coordination;Decreased safety awareness;Pain      OT Treatment/Interventions: Self-care/ADL training;Therapeutic exercise;Energy conservation;DME and/or AE instruction;Therapeutic activities;Neuromuscular education    OT Goals(Current goals can be found in the care plan section) Acute Rehab OT Goals Patient Stated Goal: none stated  OT Goal Formulation:  With patient Time For Goal Achievement: 12/23/18 Potential to Achieve Goals: Good ADL Goals Pt Will Perform Lower Body Dressing: with modified independence Pt Will Perform Toileting - Clothing Manipulation and hygiene: with modified independence Pt/caregiver will Perform Home Exercise Program: Increased strength;Both right and left upper extremity;With written HEP provided Additional ADL Goal #1: pt will increase to modiified independence with ADL functional transfers and mobility up to a community distance with fair balance and good safety awareness.  OT Frequency: Min 3X/week   Barriers to D/C: Decreased caregiver support          Co-evaluation PT/OT/SLP Co-Evaluation/Treatment: Yes Reason for Co-Treatment: Complexity of the patient's impairments (multi-system involvement);For patient/therapist safety   OT goals addressed during session: ADL's and self-care      AM-PAC OT "6 Clicks" Daily Activity     Outcome Measure Help from another person eating meals?: None Help from another person taking care of personal grooming?: A Little Help from another person toileting, which includes using toliet, bedpan, or urinal?: A Little Help from another person bathing (including washing, rinsing, drying)?: A Little Help from another person to put on and taking off regular upper body clothing?: None Help from another person to put on and taking off regular lower body clothing?: A Little 6 Click Score: 20   End of Session Equipment Utilized During Treatment: Gait belt Nurse Communication: Mobility status  Activity Tolerance: Patient tolerated treatment well;Treatment limited secondary to medical complications (Comment) Patient left: in bed;with call bell/phone within reach;with bed alarm set  OT Visit Diagnosis: Unsteadiness on feet (R26.81);Repeated falls (R29.6);Muscle weakness (generalized) (M62.81)                Time: 1962-2297 OT Time Calculation (min): 12 min Charges:  OT General  Charges $OT Visit: 1 Visit OT Evaluation $OT Eval Low Complexity: 1 Low  Darryl Nestle) Marsa Aris OTR/L Acute Rehabilitation Services Pager: [phone] Office: [phone]   Fredda Hammed 12/09/2018, 11:09 AM

## 2018-12-10 DIAGNOSIS — G9340 Encephalopathy, unspecified: Secondary | ICD-10-CM

## 2018-12-10 LAB — COMPREHENSIVE METABOLIC PANEL
ALT: 19 U/L (ref 0–44)
AST: 36 U/L (ref 15–41)
Albumin: 2.8 g/dL — ABNORMAL LOW (ref 3.5–5.0)
Alkaline Phosphatase: 142 U/L — ABNORMAL HIGH (ref 38–126)
Anion gap: 8 (ref 5–15)
BUN: 6 mg/dL (ref 6–20)
CO2: 19 mmol/L — ABNORMAL LOW (ref 22–32)
Calcium: 8.8 mg/dL — ABNORMAL LOW (ref 8.9–10.3)
Chloride: 110 mmol/L (ref 98–111)
Creatinine, Ser: 0.65 mg/dL (ref 0.44–1.00)
GFR calc Af Amer: >60 mL/min (ref >60)
GFR calc non Af Amer: >60 mL/min (ref >60)
Glucose, Bld: 95 mg/dL (ref 70–99)
Potassium: 3.8 mmol/L (ref 3.5–5.1)
Sodium: 137 mmol/L (ref 135–145)
Total Bilirubin: 0.8 mg/dL (ref 0.3–1.2)
Total Protein: 7.2 g/dL (ref 6.5–8.1)

## 2018-12-10 LAB — CBC
HCT: 39.1 % (ref 36.0–46.0)
Hemoglobin: 12.7 g/dL (ref 12.0–15.0)
MCH: 30.8 pg (ref 26.0–34.0)
MCHC: 32.5 g/dL (ref 30.0–36.0)
MCV: 94.7 fL (ref 80.0–100.0)
Platelets: 12 10*3/uL — CL (ref 150–400)
RBC: 4.13 MIL/uL (ref 3.87–5.11)
RDW: 18.9 % — ABNORMAL HIGH (ref 11.5–15.5)
WBC: 2.9 10*3/uL — ABNORMAL LOW (ref 4.0–10.5)
nRBC: 0 % (ref 0.0–0.2)

## 2018-12-10 LAB — GAMMA GT: GGT: 387 U/L — ABNORMAL HIGH (ref 7–50)

## 2018-12-10 MED ORDER — TRAMADOL HCL 50 MG PO TABS
25.0000 mg | ORAL_TABLET | Freq: Four times a day (QID) | ORAL | Status: DC | PRN
  Administered 2018-12-10 – 2018-12-13 (×5): 25 mg via ORAL
  Filled 2018-12-10 (×5): qty 1

## 2018-12-10 MED ORDER — GABAPENTIN 300 MG PO CAPS
600.0000 mg | ORAL_CAPSULE | Freq: Two times a day (BID) | ORAL | Status: DC
  Administered 2018-12-10 – 2018-12-14 (×9): 600 mg via ORAL
  Filled 2018-12-10 (×9): qty 2

## 2018-12-10 MED ORDER — TRAMADOL HCL 50 MG PO TABS
25.0000 mg | ORAL_TABLET | Freq: Once | ORAL | Status: AC
  Administered 2018-12-10: 25 mg via ORAL
  Filled 2018-12-10: qty 1

## 2018-12-10 MED ORDER — BUSPIRONE HCL 5 MG PO TABS
10.0000 mg | ORAL_TABLET | Freq: Two times a day (BID) | ORAL | Status: DC
  Administered 2018-12-10 – 2018-12-11 (×3): 10 mg via ORAL
  Filled 2018-12-10 (×3): qty 2

## 2018-12-10 MED ORDER — TRAZODONE HCL 50 MG PO TABS
100.0000 mg | ORAL_TABLET | Freq: Every day | ORAL | Status: DC
  Administered 2018-12-10 – 2018-12-13 (×4): 100 mg via ORAL
  Filled 2018-12-10 (×4): qty 2

## 2018-12-10 MED ORDER — TRAZODONE HCL 50 MG PO TABS: 50.0000 mg | ORAL_TABLET | Freq: Every evening | ORAL | Status: DC | PRN

## 2018-12-10 MED ORDER — HYDROXYZINE HCL 25 MG PO TABS
25.0000 mg | ORAL_TABLET | Freq: Four times a day (QID) | ORAL | Status: DC | PRN
  Administered 2018-12-10: 25 mg via ORAL
  Filled 2018-12-10: qty 1

## 2018-12-10 MED ORDER — LORAZEPAM 1 MG PO TABS
2.0000 mg | ORAL_TABLET | Freq: Two times a day (BID) | ORAL | Status: DC | PRN
  Administered 2018-12-10: 2 mg via ORAL
  Filled 2018-12-10: qty 2

## 2018-12-10 MED ORDER — ENSURE ENLIVE PO LIQD
237.0000 mL | Freq: Three times a day (TID) | ORAL | Status: DC
  Administered 2018-12-10 – 2018-12-14 (×11): 237 mL via ORAL

## 2018-12-10 MED ORDER — TRAZODONE HCL 50 MG PO TABS: 100.0000 mg | ORAL_TABLET | Freq: Every evening | ORAL | Status: DC | PRN

## 2018-12-10 MED ORDER — LORAZEPAM 2 MG/ML IJ SOLN
1.0000 mg | Freq: Once | INTRAMUSCULAR | Status: AC
  Administered 2018-12-10: 1 mg via INTRAVENOUS
  Filled 2018-12-10 (×2): qty 1

## 2018-12-10 NOTE — Progress Notes (Signed)
Subjective: HD#5   Overnight, she endorsed persistent and stable back pain. She declined tylenol and oxycodone because they upset her stomach. She was given tramadol because she reports taking this medication at home, although, it is not on her home med list.   This morning, She re-iterated about the pain she has been having in her back and abdomen. In regards to her abdominal pain, she reports that the pain is constant and "feels like a lightening bolt." She also endorses night sweats. She takes trazodone, hydroxyzine, and Buspar at home and was advised that theses medications would be restarted. She complained of lower extremity pain while she was receiving platelet transfusion yesterday but this spontaneously resolved. Her upper extremity tremors are intermittent. She had 1 BM yesterday. All her questions and concerns were appropriately addressed and she endorsed understanding.   Objective:  Vital signs in last 24 hours: Vitals:   12/09/18 1703 12/09/18 2214 12/10/18 0620 12/10/18 1027  BP: 98/72 96/72 105/70 96/71  Pulse:  84 72 77  Resp:  18    Temp: 98 F (36.7 C) 97.8 F (36.6 C) (!) 97.5 F (36.4 C) 97.8 F (36.6 C)  TempSrc: Oral Oral  Oral  SpO2:  99% 98% 98%  Weight:       Gen: calm, NAD Resp: CTAB CV: RRR, no m/r/g Abd: soft, NT, ND, +BS Neuro: Alert and oriented, slight tremor R>L hand with outstretched arms, no asterixis, slight nystagmus with right lateral gaze   Assessment/Plan:  Active Problems:   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HCC)   Briana Sweeney is a 48 year old femalewith a history of alcoholic cirrhosis,hepatitis c s/p treatment, chronic thrombocytopenia, alcohol use disorder,prior alcohol withdrawals, seizures, migraines,depression, andschizophrenia who presented to the ED withaltered mental status.CT headwasnegative. Ethanol level was >300.   Acute encephalopathy Alcoholic cirrhosis -Alert and oriented. Slight nystagmus this morning and  she continues to have slight tremor of R>L hand.Ddx includesalcohol withdrawal, hepatic encephalopathy, Wernicke's encephalopathy, and cerebellar degeneration 2/2 chronic etoh use. - Transaminases are now WNLs. Alk phos remains elevated. Unclear etiology, will add on GGT.  - One BM after increasing lactulose to TID, will continue TID dosing Plan -Continue TID lactulose, titrated to 2-3 BMs daily  - Thiamine '250mg'$  IV for 2-4 more days depending on clinical improvement - PT recommending HHPT with 24/7 supervision, will c/s case management  Alcohol Use Disorder: Last drink was 3/27.Tremors, nystagmus, and visual hallucinations still present but improved. CIWAs ranging 8-10 overnight. She had 4 doses of ativan over the last 24hrs. Anxiety is contributing to her CIWA scores and we have been holding her home buspar, hydroxyzine, gabapentin, and trazodone. Will resume these today.  -Resume home anxiolytics - decrease frequency of ativan from q2h to W2O - Thiamine, folic acid, multivitamin  Chronic thrombocytopeniaand leukopenia 2/2 etoh: Platelets43 on admission, decreased to 10. S/p 1U platelets. - platelets steady at 12, would like them to be > 20 prior to discharge - leukopenia 2.9 - No concern for acute bleeding at this time as her headache, back pain, and abdominal pain are stable. Low threshold to obtain imaging if nature or severity of pain changes. - If patient fevers, she will need broad spectrum abx including pseudomonal coverage based on her leukopenia -Daily CBCs. Transfuse for platelets <10  Seizures:continue home phenytoin '300mg'$  daily, levels undetectable at admission Migraines:continue home topamax '50mg'$  daily Anxiety:continue home prozac; resume home hydroxyzine '25mg'$  q6hrs prn, buspar '10mg'$  BID, and trazodone '50mg'$  qhs prn  Dispo: Anticipated discharge in  approximately 1-2 day(s) once platelets are > 20. Will arrange [facility] PT/OT.   Isabelle Course, MD 12/10/2018, 10:42 AM  Pager: [phone] IMTS PGY-1

## 2018-12-10 NOTE — Progress Notes (Signed)
Physical Therapy Treatment Patient Details Name: Briana Sweeney MRN: [id number] DOB: 11-Apr-1971 Today's Date: 12/10/2018    History of Present Illness 48yo female presenting to the ED with AMS, HA. CT head and CXR negative. Note recent admission for GI bleed, alcoholic cirrhosis, and thrombocytopenia. Admitted for acute alcoholic encephalopathy. PMH alcoholic cirrhosis, hepatitis, thrombocytopenia, seizures, alcohol disorder, schizophrenia, bipolar disorder    PT Comments    Pt found in bed in quiet room- reports she cannot stand to even listen to the TV. Has pain on the left side of her neck along upper trap and in Left SIJ. Has had some cramps in Left hamstrings but reports this happens frequently anyways. Feels that the room is spinning when turning her head all directions but to the Left and up/down are the worst. Educated about BPPV and pt denied testing at this time because she does not want to roll. Stood next to her bed but required CGA with increased postural sway. Elevated HOB and asked her to keep SIJs on elevated part of mattress to decrease stress on joint. Pt reports she might want to walk later and said we would come back if time/schedule allows.   Follow Up Recommendations  Home health PT;Supervision/Assistance - 24 hour     Equipment Recommendations  None recommended by PT    Recommendations for Other Services       Precautions / Restrictions Precautions Precautions: Fall Precaution Comments: dizziness Restrictions Weight Bearing Restrictions: No    Mobility  Bed Mobility Overal bed mobility: Independent                Transfers Overall transfer level: Needs assistance(guarding for safety due to dizziness) Equipment used: None Transfers: Sit to/from Stand Sit to Stand: Min guard         General transfer comment: CGA for dizziness  Ambulation/Gait             General Gait Details: pt refused gait for now, "maybe later"   Stairs              Wheelchair Mobility    Modified Rankin (Stroke Patients Only)       Balance Overall balance assessment: Needs assistance;History of Falls Sitting-balance support: No upper extremity supported;Feet supported Sitting balance-Leahy Scale: Good     Standing balance support: No upper extremity supported Standing balance-Leahy Scale: Fair Standing balance comment: CGA- increased postural sway due to dizziness                            Cognition Arousal/Alertness: Awake/alert Behavior During Therapy: Flat affect Overall Cognitive Status: No family/caregiver present to determine baseline cognitive functioning                   Orientation Level: Person;Place;Time;Situation Current Attention Level: Focused   Following Commands: Follows one step commands consistently   Awareness: Intellectual   General Comments: Pt followed all commands and answered questions appropriately      Exercises      General Comments        Pertinent Vitals/Pain Pain Assessment: No/denies pain Pain Score: ("a lot") Pain Location: Lt SIJ Pain Descriptors / Indicators: Sore Pain Intervention(s): Monitored during session;Repositioned    Home Living Family/patient expects to be discharged to:: Private residence Living Arrangements: Spouse/significant other Available Help at Discharge: Available PRN/intermittently;Friend(s) Type of Home: House Home Access: Stairs to enter   Home Layout: Full bath on main level Home Equipment: None  Prior Function Level of Independence: Independent      Comments: ambulatory with occasional falls due to seizure per her report    PT Goals (current goals can now be found in the care plan section)      Frequency    Min 3X/week      PT Plan      Co-evaluation              AM-PAC PT "6 Clicks" Mobility   Outcome Measure  Help needed turning from your back to your side while in a flat bed without using bedrails?:  None Help needed moving from lying on your back to sitting on the side of a flat bed without using bedrails?: None Help needed moving to and from a bed to a chair (including a wheelchair)?: A Little Help needed standing up from a chair using your arms (e.g., wheelchair or bedside chair)?: A Little Help needed to walk in hospital room?: A Little Help needed climbing 3-5 steps with a railing? : A Little 6 Click Score: 20    End of Session   Activity Tolerance: Patient limited by pain;Treatment limited secondary to medical complications (Comment) Patient left: in bed;with call bell/phone within reach   PT Visit Diagnosis: Unsteadiness on feet (R26.81);Muscle weakness (generalized) (M62.81);History of falling (Z91.81)     Time: 1200-1209 PT Time Calculation (min) (ACUTE ONLY): 9 min  Charges:  $Therapeutic Activity: 8-22 mins                     Selinda Eon PT, DPT Acute Rehab [phone]

## 2018-12-10 NOTE — Progress Notes (Signed)
OT Cancellation Note  Patient Details Name: Briana Sweeney MRN: [id number] DOB: 01/14/1971   Cancelled Treatment:    Reason Eval/Treat Not Completed: Patient declined, no reason specified(Pt reports inability to watch TV, but refusing further treat.)  Darryl Nestle) Marsa Aris OTR/L [place] Pager: [phone] Office: [phone]   Fredda Hammed 12/10/2018, 2:59 PM

## 2018-12-11 ENCOUNTER — Telehealth: Payer: Self-pay

## 2018-12-11 LAB — CBC
HCT: 40.2 % (ref 36.0–46.0)
Hemoglobin: 12.8 g/dL (ref 12.0–15.0)
MCH: 30 pg (ref 26.0–34.0)
MCHC: 31.8 g/dL (ref 30.0–36.0)
MCV: 94.4 fL (ref 80.0–100.0)
Platelets: 13 10*3/uL — CL (ref 150–400)
RBC: 4.26 MIL/uL (ref 3.87–5.11)
RDW: 18.9 % — ABNORMAL HIGH (ref 11.5–15.5)
WBC: 2.4 10*3/uL — ABNORMAL LOW (ref 4.0–10.5)
nRBC: 0 % (ref 0.0–0.2)

## 2018-12-11 MED ORDER — DIAZEPAM 5 MG PO TABS
5.0000 mg | ORAL_TABLET | Freq: Two times a day (BID) | ORAL | Status: DC
  Administered 2018-12-11 – 2018-12-12 (×4): 5 mg via ORAL
  Filled 2018-12-11 (×5): qty 1

## 2018-12-11 MED ORDER — VITAMIN B-1 100 MG PO TABS
100.0000 mg | ORAL_TABLET | Freq: Every day | ORAL | Status: DC
  Administered 2018-12-12 – 2018-12-14 (×3): 100 mg via ORAL
  Filled 2018-12-11 (×3): qty 1

## 2018-12-11 NOTE — Progress Notes (Signed)
Subjective: HD#6   Ms. Bahar was examined at bedside and was found sitting up in bed. She reports that she feels well this morning with exception of her chronic lower back pain. She states that she was up all night with no clear indication if the Trazodone helped. She states that Buspar "makes me hyper." She states that she would like to go home. Reports of improvement with hydroxyzine.   I counseled Ms. Tesch on the importance of abstaining from alcohol. Explicitly making her aware of the long term effects of alcohol use on her low platelets in regards to bleeding. In addition, I made her aware of the option for starting Naltrexone which would reduce her cravings for alcohol. She expressed understanding and is open to starting naltrexone on discharge. She follows up with Behavior Health but states it has been a while since she was evaluated by a psychiatrist. She has also tried Alcohol Anonymous and states "it makes her want to drink more because all the people there are drunk."   Overall, she is hopeful to reduce her intake of alcohol but states it'll be challenging for her to completely abstain from alcohol.   Objective:  Vital signs in last 24 hours: Vitals:   12/10/18 1509 12/10/18 1510 12/10/18 2236 12/11/18 0521  BP: (!) 88/64 106/76 101/70 109/71  Pulse: 80 83 82 78  Resp:   18 18  Temp: 98 F (36.7 C)  98 F (36.7 C) 97.6 F (36.4 C)  TempSrc: Oral  Oral Oral  SpO2: 97% 99% 96% 96%  Weight:       Gen: calm, NAD Resp: CTAB CV: RRR, no m/r/g Abd: soft, NT, ND, +BS Neuro: Alert and oriented, slight tremor R>L hand with outstretched arms but improved compared to yesterday, no asterixis, slight nystagmus with left lateral gaze   Assessment/Plan:  Active Problems:   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HCC)   [name] is a 48 year old femalewith a history of alcoholic cirrhosis,hepatitis c s/p treatment, chronic thrombocytopenia, alcohol use disorder,prior alcohol  withdrawals, seizures, migraines,depression, andschizophrenia who presented to the ED withaltered mental status.CT headwasnegative. Ethanol level was >300.   Acute encephalopathy Alcoholic cirrhosis -Alert and oriented. Slight nystagmus this morning and she continues to have slight tremor of R>L hand.Ddx includesalcohol withdrawal, hepatic encephalopathy, Wernicke's encephalopathy, and cerebellar degeneration 2/2 chronic etoh use. - GGT elevated, therefore elevated alk phos is likely hepatic in origin. However, she is on phenytoin which can also elevate GGT - encourage lactulose use, TID dosing, titrate to 2-3 BMs daily   - Day 7 of high dose IV thiamine, will switch to po tomorrow   Alcohol Use Disorder: Last drink was 3/27.Tremors, nystagmus, and visual hallucinations still present but improved. Resumed home anxiolytics yesterday but CIWAs and ativan requirement remains unchanged. She is actively withdrawing but symptoms are mild and being adequately treated. CIWAs overnight ranged from 8 to 10, she has received '5mg'$  ativan in the last 24 hrs.  - IV ativan q5h for CIWA > 9, will discontinue CIWA tomorrow has her last drink was 7 days ago.  - Thiamine, folic acid, multivitamin. This is day 7 of IV thiamine. Will transition to po dosing tomorrow  - counseled on alcohol cessation, especially given risk of complications from novel coronavirus - offered naltrexone therapy, will prescribe at discharge  Anxiety:continues to endorse anxiety that is interfering with her ability to sleep. Buspar makes her hyper so we will discontinue it. Trazodone usually helps her sleep at home but  did not help last night.   - continue home prozac, hydroxyzine '25mg'$  q6hrs prn, and trazodone '100mg'$  qhs prn - discontinue buspar - start long acting benzo while inpatient in order to decrease the chances of her leaving AMA  - diazepam '5mg'$  BID, can increase to '10mg'$  if needed based on response   Chronic  thrombocytopeniaand leukopenia 2/2 etoh: Platelets43 on admission, decreased to 10. S/p 1U platelets. - platelets increased to 13 today, would like them to be > 20 prior to discharge - leukopenia stable at 2.4 - No concern for acute bleeding at this time as her headache, back pain, and abdominal pain are stable. Low threshold to obtain imaging if nature or severity of pain changes. - If patient fevers, she will need broad spectrum abx including pseudomonal coverage based on her leukopenia -Daily CBCs. Transfuse for platelets <10  Seizures:continue home phenytoin '300mg'$  daily, levels undetectable at admission Migraines:continue home topamax '50mg'$  daily   Dispo: Anticipated discharge in approximately 1-2 day(s) once platelets are > 20. Will arrange [facility] PT/OT.   Isabelle Course, MD 12/11/2018, 9:10 AM Pager: [phone] IMTS PGY-1

## 2018-12-11 NOTE — Progress Notes (Signed)
Occupational Therapy Treatment Patient Details Name: Briana Sweeney MRN: [id number] DOB: 26-Sep-1970 Today's Date: 12/11/2018    History of present illness 48yo female presenting to the ED with AMS, HA. CT head and CXR negative. Note recent admission for GI bleed, alcoholic cirrhosis, and thrombocytopenia. Admitted for acute alcoholic encephalopathy. PMH alcoholic cirrhosis, hepatitis, thrombocytopenia, seizures, alcohol disorder, schizophrenia, bipolar disorder   OT comments  Pt performing limited visual acuity, scanning and tracking tasks with 75% accuracy for 5 mins as pt was too fatigued to continue. Pt ambulatory in room with minguardA for stability and for IV pole. Pt encouraged to open eyes when awake and read menu and items on TV. Pt did not report blurry vision at this time. Pt would greatly benefit from continued OT skilled services for ADL, mobility and safety in [facility] setting. OT to follow acutely- still attempting visual tasks.     Follow Up Recommendations  Home health OT;Supervision/Assistance - 24 hour    Equipment Recommendations  Other (comment)(to be determined)    Recommendations for Other Services      Precautions / Restrictions Precautions Precautions: Fall Precaution Comments: dizziness Restrictions Weight Bearing Restrictions: No       Mobility Bed Mobility Overal bed mobility: Independent             General bed mobility comments: no physical assist given  Transfers       Sit to Stand: Supervision              Balance Overall balance assessment: Needs assistance;History of Falls   Sitting balance-Leahy Scale: Good       Standing balance-Leahy Scale: Fair                             ADL either performed or assessed with clinical judgement   ADL Overall ADL's : Needs assistance/impaired                                     Functional mobility during ADLs: Min guard General ADL Comments: Able to perform  with supervisionA and minguardA in standing.     Vision       Perception     Praxis      Cognition Arousal/Alertness: Lethargic;Suspect due to medications(RN gave valium) Behavior During Therapy: Flat affect Overall Cognitive Status: No family/caregiver present to determine baseline cognitive functioning                                 General Comments: Pt's  voice trailing off and unable to complete thoughts and required reawakening for each command.        Exercises     Shoulder Instructions       General Comments Visual scanning worksheet and fine a word puzzle given for activity. pt unable to complete task, but proved to be able to read with 75% accuracy missing 1 letter in ever few words. pt unable to keep eyes open and OTR checked on pt a few times today and pt very fatigued each time.    Pertinent Vitals/ Pain       Pain Assessment: No/denies pain Pain Intervention(s): Limited activity within patient's tolerance  Home Living  Prior Functioning/Environment              Frequency  Min 3X/week        Progress Toward Goals  OT Goals(current goals can now be found in the care plan section)  Progress towards OT goals: Progressing toward goals  Acute Rehab OT Goals Patient Stated Goal: none stated  OT Goal Formulation: With patient Time For Goal Achievement: 12/23/18 Potential to Achieve Goals: Good ADL Goals Pt Will Perform Lower Body Dressing: with modified independence Pt Will Perform Toileting - Clothing Manipulation and hygiene: with modified independence Pt/caregiver will Perform Home Exercise Program: Increased strength;Both right and left upper extremity;With written HEP provided Additional ADL Goal #1: pt will increase to modiified independence with ADL functional transfers and mobility up to a community distance with fair balance and good safety awareness.  Plan Discharge  plan remains appropriate    Co-evaluation                 AM-PAC OT "6 Clicks" Daily Activity     Outcome Measure   Help from another person eating meals?: None Help from another person taking care of personal grooming?: A Little Help from another person toileting, which includes using toliet, bedpan, or urinal?: A Little Help from another person bathing (including washing, rinsing, drying)?: A Little Help from another person to put on and taking off regular upper body clothing?: None Help from another person to put on and taking off regular lower body clothing?: A Little 6 Click Score: 20    End of Session    OT Visit Diagnosis: Unsteadiness on feet (R26.81);Repeated falls (R29.6);Muscle weakness (generalized) (M62.81)   Activity Tolerance Patient limited by fatigue   Patient Left in bed;with call bell/phone within reach;with bed alarm set   Nurse Communication Mobility status        Time: 1242-1300 OT Time Calculation (min): 18 min  Charges: OT General Charges $OT Visit: 1 Visit OT Treatments $Neuromuscular Re-education: 8-22 mins  Darryl Nestle) Marsa Aris OTR/L Acute Rehabilitation Services Pager: [phone] Office: [phone]    Fredda Hammed 12/11/2018, 4:03 PM

## 2018-12-11 NOTE — Progress Notes (Signed)
Physical Therapy Treatment Patient Details Name: Briana Sweeney MRN: [id number] DOB: 09/03/1971 Today's Date: 12/11/2018    History of Present Illness 48yo female presenting to the ED with AMS, HA. CT head and CXR negative. Note recent admission for GI bleed, alcoholic cirrhosis, and thrombocytopenia. Admitted for acute alcoholic encephalopathy. PMH alcoholic cirrhosis, hepatitis, thrombocytopenia, seizures, alcohol disorder, schizophrenia, bipolar disorder    PT Comments    Patient seen for mobility progression. Pt is making progress toward PT goals and agreeable to ambulate this session. Pt continues to present with balance deficits and with DGI score of 13/24 indicating risk for falls. Discussed pt staying downstairs instead of using uspstairs bedroom initially upon d/c.  Current plan remains appropriate.    Follow Up Recommendations  Home health PT;Supervision/Assistance - 24 hour     Equipment Recommendations  None recommended by PT    Recommendations for Other Services       Precautions / Restrictions Precautions Precautions: Fall Precaution Comments: dizziness Restrictions Weight Bearing Restrictions: No    Mobility  Bed Mobility Overal bed mobility: Independent             General bed mobility comments: no physical assist given  Transfers Overall transfer level: Needs assistance Equipment used: None Transfers: Sit to/from Stand Sit to Stand: Supervision            Ambulation/Gait Ambulation/Gait assistance: Min guard Gait Distance (Feet): 200 Feet Assistive device: None Gait Pattern/deviations: Step-through pattern;Narrow base of support;Decreased stride length;Drifts right/left Gait velocity: decreased    General Gait Details: decreased cadence and no overt LOB; grossly min guard but min A with L horizontal head turn   Stairs Stairs: Yes Stairs assistance: Min guard;Mod assist Stair Management: Alternating pattern;Step to  pattern;Forwards;Sideways;One rail Right Number of Stairs: 10 General stair comments: min guard to ascend and mod A to descend due to bilat LE weakness and instability; use of R hand rail; forward to ascend and sideways to descend    Wheelchair Mobility    Modified Rankin (Stroke Patients Only)       Balance Overall balance assessment: Needs assistance;History of Falls Sitting-balance support: No upper extremity supported;Feet supported Sitting balance-Leahy Scale: Good     Standing balance support: No upper extremity supported Standing balance-Leahy Scale: Fair                   Standardized Balance Assessment Standardized Balance Assessment : Dynamic Gait Index   Dynamic Gait Index Level Surface: Mild Impairment Change in Gait Speed: Mild Impairment Gait with Horizontal Head Turns: Moderate Impairment Gait with Vertical Head Turns: Moderate Impairment Gait and Pivot Turn: Mild Impairment Step Over Obstacle: Mild Impairment Step Around Obstacles: Moderate Impairment Steps: Mild Impairment Total Score: 13      Cognition Arousal/Alertness: Awake/alert Behavior During Therapy: Flat affect Overall Cognitive Status: No family/caregiver present to determine baseline cognitive functioning                                 General Comments: Pt followed all commands and answered questions appropriately      Exercises      General Comments        Pertinent Vitals/Pain Pain Assessment: Faces Faces Pain Scale: Hurts a little bit Pain Location: Lt SIJ Pain Descriptors / Indicators: Sore Pain Intervention(s): Limited activity within patient's tolerance;Monitored during session;Repositioned    Home Living  Prior Function            PT Goals (current goals can now be found in the care plan section) Acute Rehab PT Goals Patient Stated Goal: none stated  Progress towards PT goals: Progressing toward goals     Frequency    Min 3X/week      PT Plan Current plan remains appropriate    Co-evaluation              AM-PAC PT "6 Clicks" Mobility   Outcome Measure  Help needed turning from your back to your side while in a flat bed without using bedrails?: None Help needed moving from lying on your back to sitting on the side of a flat bed without using bedrails?: None Help needed moving to and from a bed to a chair (including a wheelchair)?: A Little Help needed standing up from a chair using your arms (e.g., wheelchair or bedside chair)?: A Little Help needed to walk in hospital room?: A Little Help needed climbing 3-5 steps with a railing? : A Little 6 Click Score: 20    End of Session Equipment Utilized During Treatment: Gait belt Activity Tolerance: Patient tolerated treatment well Patient left: in bed;with call bell/phone within reach;with bed alarm set Nurse Communication: Mobility status PT Visit Diagnosis: Unsteadiness on feet (R26.81);Muscle weakness (generalized) (M62.81);History of falling (Z91.81)     Time: 2595-6387 PT Time Calculation (min) (ACUTE ONLY): 18 min  Charges:  $Gait Training: 8-22 mins                     Earney Navy, PTA Acute Rehabilitation Services Pager: [phone] Office: [phone]     Darliss Cheney 12/11/2018, 10:39 AM

## 2018-12-11 NOTE — Telephone Encounter (Signed)
Left a vm for patient to callback 

## 2018-12-11 NOTE — TOC Initial Note (Signed)
Transition of Care [facility]) - Initial/Assessment Note    Patient Details  Name: Briana Sweeney MRN: [id number] Date of Birth: 04/15/1971  [facility]) CM/SW Contact:    Sharin Mons, RN Phone Number: 12/11/2018, 3:57 PM  Clinical Narrative:                 Pt presents with Alcoholic encephalopathy. From home with boyfriend Herbie Baltimore: [address], [place].  Expected Discharge Plan: [place] Services(Resides with Herbie Baltimore (boyfriend), [phone]) Barriers to Discharge: Continued Medical Work up   Patient Goals and CMS Choice     Choice offered to / list presented to : Patient  Expected Discharge Plan and Services Expected Discharge Plan: [place] Services(Resides with Herbie Baltimore (boyfriend), [phone]) In-house Referral: Clinical Social Work(declined substance abuse resources) Discharge Planning Services: CM Consult Post Acute Care Choice: [place] arrangements for the past 2 months: Apartment                     HH Arranged: PT,OT [place]: [place]  Prior Living Arrangements/Services Living arrangements for the past 2 months: Apartment Lives with:: Other (Comment)(boyfriend) Patient language and need for interpreter reviewed:: Yes Do you feel safe going back to the place where you live?: Yes      Need for Family Participation in Patient Care: No (Comment) Care giver support system in place?: No (comment)   Criminal Activity/Legal Involvement Pertinent to Current Situation/Hospitalization: No - Comment as needed  Activities of Daily Living      Permission Sought/Granted   Permission granted to share information with : Yes, Verbal Permission Granted  Share Information with NAME: Herbie Baltimore ( boyfriend)           Emotional Assessment Appearance:: Appears stated age Attitude/Demeanor/Rapport: Gracious Affect (typically observed): Accepting Orientation: : Oriented to Self, Oriented to  Time, Oriented  to Place, Oriented to Situation Alcohol / Substance Use: Illicit Drugs Psych Involvement: No (comment)  Admission diagnosis:  Cirrhosis ([place]) [K74.60] Altered mental status, unspecified altered mental status type [R41.82] Patient Active Problem List   Diagnosis Date Noted  . 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[place]) 12/05/2018  . Anxiety 10/27/2018  . Neuropathy 10/27/2018  . Abscess of bursa of left elbow 09/23/2018  . Olecranon bursitis of left elbow   . Erysipelas 09/13/2018  . Acute metabolic encephalopathy 27/25/3664  . Overdose 08/22/2018  . Hypotension 08/22/2018  . Seizure ([place]) 08/22/2018  . Substance induced mood disorder ([place]) 07/17/2018  . Cocaine dependence without complication ([place]) 40/34/7425  . Polysubstance abuse ([place]) 04/08/2018  . 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 Hepatitis C 03/18/2018  . Portal hypertension ([place]) 03/18/2018  . Alcohol abuse with alcohol-induced disorder ([place]) 03/18/2018  . Depression 03/18/2018  . Upper GI bleed 02/10/2018  . Alcohol withdrawal, with unspecified complication ([place]) 95/63/8756  . Chronic anemia   . Thrombocytopenia due to drugs   . Suicidal ideation   . Thrombocytopenia ([place]) 01/02/2018  . GERD (gastroesophageal reflux disease) 11/01/2017  . Trichimoniasis 10/30/2017  . Alcohol dependence ([place]) 10/29/2017  . MDD (major depressive disorder), severe ([place]) 10/28/2017  . Acute hyperactive alcohol withdrawal delirium ([place]) 10/09/2017  . Schizophrenia ([place]) 10/09/2017  . Alcohol abuse with alcohol-induced mood disorder ([place])   . Alcohol withdrawal syndrome with complication ([place]) 43/32/9518  . Esophageal varices without bleeding ([place])   . Hematemesis 09/01/2017  . Ascites due to alcoholic cirrhosis ([place])   . Decompensated hepatic cirrhosis ([place]) 07/23/2017  .  Alcohol abuse 07/23/2017  . Hepatitis C antibody positive in blood 07/23/2017  . Hypokalemia 07/23/2017  . Jaundice 07/23/2017  . Coagulopathy ([place]) 07/23/2017   . Hypomagnesemia 07/23/2017  . Pancytopenia ([place]) 07/23/2017  . Alcoholic cirrhosis of liver with ascites ([place]) 07/23/2017  . Bacterial vaginosis 06/03/2017  . UTI (urinary tract infection) 06/02/2017   PCP:  [name], FNP Pharmacy:   [facility], [place] [address] [place] [address] Phone: [phone] Fax: [id number] - [place], [place] [address] Phone: [phone] Fax: [phone]  [facility], [place] [address] [place] [address] Phone: [phone] Fax: [phone]     Social Determinants of Health (SDOH) Interventions    Readmission Risk Interventions No flowsheet data found.

## 2018-12-11 NOTE — Plan of Care (Signed)
  Problem: Education: Goal: Knowledge of General Education information will improve Description: Including pain rating scale, medication(s)/side effects and non-pharmacologic comfort measures Outcome: Progressing   Problem: Health Behavior/Discharge Planning: Goal: Ability to manage health-related needs will improve Outcome: Progressing   Problem: Clinical Measurements: Goal: Ability to maintain clinical measurements within normal limits will improve Outcome: Progressing Goal: Will remain free from infection Outcome: Progressing Goal: Respiratory complications will improve Outcome: Progressing Goal: Cardiovascular complication will be avoided Outcome: Progressing   Problem: Nutrition: Goal: Adequate nutrition will be maintained Outcome: Progressing   Problem: Coping: Goal: Level of anxiety will decrease Outcome: Progressing   Problem: Pain Managment: Goal: General experience of comfort will improve Outcome: Progressing   Problem: Safety: Goal: Ability to remain free from injury will improve Outcome: Progressing   Problem: Skin Integrity: Goal: Risk for impaired skin integrity will decrease Outcome: Progressing   

## 2018-12-12 DIAGNOSIS — R4182 Altered mental status, unspecified: Secondary | ICD-10-CM

## 2018-12-12 LAB — CBC
HCT: 40.2 % (ref 36.0–46.0)
Hemoglobin: 13.2 g/dL (ref 12.0–15.0)
MCH: 31 pg (ref 26.0–34.0)
MCHC: 32.8 g/dL (ref 30.0–36.0)
MCV: 94.4 fL (ref 80.0–100.0)
Platelets: 12 10*3/uL — CL (ref 150–400)
RBC: 4.26 MIL/uL (ref 3.87–5.11)
RDW: 18.6 % — ABNORMAL HIGH (ref 11.5–15.5)
WBC: 2.6 10*3/uL — ABNORMAL LOW (ref 4.0–10.5)
nRBC: 0 % (ref 0.0–0.2)

## 2018-12-12 MED ORDER — LORAZEPAM 1 MG PO TABS
2.0000 mg | ORAL_TABLET | Freq: Three times a day (TID) | ORAL | Status: DC | PRN
  Administered 2018-12-12 (×2): 2 mg via ORAL
  Filled 2018-12-12 (×2): qty 2

## 2018-12-12 NOTE — Progress Notes (Signed)
Physical Therapy Treatment Patient Details Name: Briana Sweeney MRN: [id number] DOB: December 22, 1970 Today's Date: 12/12/2018    History of Present Illness Pt is a 48 y/o female presenting to the ED with AMS, HA. CT head and CXR negative. Note recent admission for GI bleed, alcoholic cirrhosis, and thrombocytopenia. Admitted for acute alcoholic encephalopathy. PMH alcoholic cirrhosis, hepatitis, thrombocytopenia, seizures, alcohol disorder, schizophrenia, bipolar disorder    PT Comments    Pt continuing to make steady progress with functional mobility. Pt would continue to benefit from skilled physical therapy services at this time while admitted and after d/c to address the below listed limitations in order to improve overall safety and independence with functional mobility.    Follow Up Recommendations  Home health PT;Supervision/Assistance - 24 hour     Equipment Recommendations  None recommended by PT    Recommendations for Other Services       Precautions / Restrictions Precautions Precautions: Fall Restrictions Weight Bearing Restrictions: No    Mobility  Bed Mobility Overal bed mobility: Independent                Transfers Overall transfer level: Modified independent Equipment used: None                Ambulation/Gait Ambulation/Gait assistance: Supervision Gait Distance (Feet): 200 Feet Assistive device: None Gait Pattern/deviations: Step-through pattern;Decreased stride length Gait velocity: decreased    General Gait Details: pt with mild instability but no overt LOB or need for physical assistance, supervision for safety; pt asymptomatic throughout   Stairs             Wheelchair Mobility    Modified Rankin (Stroke Patients Only)       Balance Overall balance assessment: Needs assistance Sitting-balance support: No upper extremity supported;Feet supported Sitting balance-Leahy Scale: Good     Standing balance support: No upper  extremity supported Standing balance-Leahy Scale: Fair                              Cognition Arousal/Alertness: Awake/alert Behavior During Therapy: Flat affect Overall Cognitive Status: Within Functional Limits for tasks assessed                                 General Comments: cognition not formally assessed but [facility] for general conversation, appropriately answering questions and following simple commands      Exercises      General Comments        Pertinent Vitals/Pain Pain Assessment: Faces Faces Pain Scale: Hurts a little bit Pain Location: R lateral ankle Pain Descriptors / Indicators: Sore Pain Intervention(s): Monitored during session    Home Living                      Prior Function            PT Goals (current goals can now be found in the care plan section) Acute Rehab PT Goals PT Goal Formulation: With patient Time For Goal Achievement: 12/23/18 Potential to Achieve Goals: Good Progress towards PT goals: Progressing toward goals    Frequency    Min 3X/week      PT Plan Current plan remains appropriate    Co-evaluation              AM-PAC PT "6 Clicks" Mobility   Outcome Measure  Help needed turning from your back  to your side while in a flat bed without using bedrails?: None Help needed moving from lying on your back to sitting on the side of a flat bed without using bedrails?: None Help needed moving to and from a bed to a chair (including a wheelchair)?: A Little Help needed standing up from a chair using your arms (e.g., wheelchair or bedside chair)?: None Help needed to walk in hospital room?: A Little Help needed climbing 3-5 steps with a railing? : A Little 6 Click Score: 21    End of Session   Activity Tolerance: Patient tolerated treatment well Patient left: in bed;with call bell/phone within reach Nurse Communication: Mobility status PT Visit Diagnosis: Unsteadiness on feet  (R26.81);Muscle weakness (generalized) (M62.81);History of falling (Z91.81)     Time: 6546-5035 PT Time Calculation (min) (ACUTE ONLY): 13 min  Charges:  $Gait Training: 8-22 mins                     Sherie Don, [place], DPT  Acute Rehabilitation Services Pager [phone] Office [place] 12/12/2018, 3:33 PM

## 2018-12-12 NOTE — Plan of Care (Signed)
  Problem: Education: Goal: Knowledge of General Education information will improve Description Including pain rating scale, medication(s)/side effects and non-pharmacologic comfort measures Outcome: Progressing   Problem: Education: Goal: Knowledge of General Education information will improve Description Including pain rating scale, medication(s)/side effects and non-pharmacologic comfort measures Outcome: Progressing   Problem: Health Behavior/Discharge Planning: Goal: Ability to manage health-related needs will improve Outcome: Progressing   Problem: Clinical Measurements: Goal: Ability to maintain clinical measurements within normal limits will improve Outcome: Progressing Goal: Will remain free from infection Outcome: Progressing Goal: Diagnostic test results will improve Outcome: Progressing Goal: Respiratory complications will improve Outcome: Progressing   Problem: Nutrition: Goal: Adequate nutrition will be maintained Outcome: Progressing

## 2018-12-12 NOTE — Progress Notes (Signed)
Subjective: HD#7   Ms. Schaad was examined at bedside this AM. She was napping when seen but easily arousable. We discussed how symptoms of anxiety and restlessness were doing. She stated that after starting valium yesterday she has been able to get better rest and her anxiety is improved. I explained to her that we would be stopping the IV ativan as she is outside of the window for withdrawal and we are likely treating some baseline symptoms now. She knows she will continue to received scheduled valium and has PRN PO ativan available.  We also discussed her continued low platelets, noting that her level was down 1K this morning from 13K to 12K. We discussed the need to continue to trend this overtime and hope to see the levels increase as her natural production increases. She asked about a transfusion and I explained that there are limitations on when we can transfuse (typicall at or below 10K), and that we would like to see her levels increase naturally as she will not be able to have transfusion at home. She is amenable to remaining in the hospital for further monitoring and treatment.  Objective:  Vital signs in last 24 hours: Vitals:   12/11/18 0521 12/11/18 1510 12/11/18 2140 12/12/18 0540  BP: 109/71 1'00/73 93/61 97/65 '$  Pulse: 78 62 74 78  Resp: '18  16 15  '$ Temp: 97.6 F (36.4 C) 97.9 F (36.6 C) 98.3 F (36.8 C) 98.3 F (36.8 C)  TempSrc: Oral Oral Oral Oral  SpO2: 96% 98% 95% 94%  Weight:       Gen: calm, NAD Resp: CTAB CV: RRR, no m/r/g Abd: soft, NT, ND, +BS Neuro: Alert and oriented, nl mood  Assessment/Plan: Active Problems:   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[place])  Ms. Louanne Calvillo is a 48 year old femalewith a history of alcoholic cirrhosis,hepatitis c s/p treatment, chronic thrombocytopenia, alcohol use disorder,prior alcohol withdrawals, seizures, migraines,depression, andschizophrenia who presented to the ED withaltered mental status.CT headwasnegative. Ethanol  level was >300.   Chronic thrombocytopeniaand leukopenia 2/2 etoh: Platelets43 on admission, decreased to 10. S/p 1U platelets. - Platelets stable at 12-13 for several days, would like them to be > 20 prior to discharge - leukopenia stable at 2.6 - No concern for acute bleeding at this time as her headache, back pain, and abdominal pain are stable. Low threshold to obtain imaging if nature or severity of pain changes. - If patient fevers, she will need broad spectrum abx including pseudomonal coverage based on her leukopenia -Daily CBCs. Transfuse for platelets </=10  Anxiety:Has been able to rest since addition of valium. Anxiety improving.   - Continue home prozac, hydroxyzine '25mg'$  q6hrs prn, and trazodone '100mg'$  qhs prn - Diazepam '5mg'$  BID, can increase to '10mg'$  if needed based on response  - PO Ativan '1mg'$  q8h PRN  Alcohol Use Disorder: Last drink was 3/27. Resumption of home anxiolytics may have helped, but she was still requiring regular ativan, so long acting valium added. '3mg'$  ativan in the last 24 hrs. Doing better since addition of valium. - Counseled on alcohol cessation, especially given risk of complications from novel coronavirus - Has been offered naltrexone therapy, will prescribe at discharge - CIWA discontinued - Thiamine, folic acid, multivitamin  Acute encephalopathy Alcoholic cirrhosis -Alert and oriented. Calmer this AM and able to rest overnight.Ddx includesalcohol withdrawal, hepatic encephalopathy, Wernicke's encephalopathy, and cerebellar degeneration 2/2 chronic etoh use. - GGT elevated, therefore elevated alk phos is likely hepatic in origin. However, she is on phenytoin  which can also elevate GGT - encourage lactulose use, TID dosing, titrate to 2-3 BMs daily   - Completed 7 Days IV thiamine, Now PO '100mg'$  Daily   Seizures:continue home phenytoin '300mg'$  daily, levels undetectable at admission Migraines:continue home topamax '50mg'$  daily  Dispo:  Anticipated discharge in approximately 1-2 day(s) once platelets are > 20. Will arrange [facility] PT/OT.   Neva Seat, MD 12/12/2018, 7:05 AM

## 2018-12-13 LAB — CBC
HCT: 39.5 % (ref 36.0–46.0)
Hemoglobin: 13 g/dL (ref 12.0–15.0)
MCH: 31 pg (ref 26.0–34.0)
MCHC: 32.9 g/dL (ref 30.0–36.0)
MCV: 94 fL (ref 80.0–100.0)
Platelets: 12 10*3/uL — CL (ref 150–400)
RBC: 4.2 MIL/uL (ref 3.87–5.11)
RDW: 18.4 % — ABNORMAL HIGH (ref 11.5–15.5)
WBC: 2.3 10*3/uL — ABNORMAL LOW (ref 4.0–10.5)
nRBC: 0 % (ref 0.0–0.2)

## 2018-12-13 MED ORDER — DIAZEPAM 5 MG PO TABS
5.0000 mg | ORAL_TABLET | Freq: Every day | ORAL | Status: DC
  Administered 2018-12-13: 5 mg via ORAL
  Filled 2018-12-13: qty 1

## 2018-12-13 MED ORDER — LORAZEPAM 1 MG PO TABS
1.0000 mg | ORAL_TABLET | Freq: Three times a day (TID) | ORAL | Status: DC | PRN
  Administered 2018-12-13 (×2): 1 mg via ORAL
  Filled 2018-12-13 (×2): qty 1

## 2018-12-13 MED ORDER — DIAZEPAM 5 MG PO TABS
10.0000 mg | ORAL_TABLET | Freq: Every morning | ORAL | Status: DC
  Administered 2018-12-13 – 2018-12-14 (×2): 10 mg via ORAL
  Filled 2018-12-13 (×2): qty 2

## 2018-12-13 NOTE — Progress Notes (Signed)
Subjective: HD#8   Briana Sweeney was seen in her room this morning. She had been been up after her daughter called. She is feeling better today in terms of her anxiety. She does miss the faster relief of the IV medication, but seems to understand that was meant more for severe dangerous symptoms and that the PO is more appropriate for her. She does state she has more symptoms in the afternoon and is wondering if the dose of her medication could be increased.  We again discussed her plantlets noting that there was no change overnight. We discussed that it may take some time for this to improve, but it will be very difficult for it to improve if she goes home and starts drinking again. I informed her I would look in to what other options their may be (if any) to help her platelets and how long she should stay.   We also discussed making sure to take all 3 doses of her lactulose to increase her bowl movements to 2 a day (she is having 1 a day right now).  Objective:  Vital signs in last 24 hours: Vitals:   12/12/18 1416 12/12/18 2209 12/13/18 0450 12/13/18 0550  BP: 102/66 101/82 91/62   Pulse: 79 79 69   Resp: '20 18 18   '$ Temp: (!) 97.4 F (36.3 C) 98.2 F (36.8 C) 98 F (36.7 C)   TempSrc: Oral Oral Oral   SpO2: 97% 95% 93%   Weight:      Height:    5' (1.524 m)   Gen: calm, NAD Resp: CTAB CV: RRR, no m/r/g Abd: soft, NT, ND, +BS Neuro: Alert and oriented, nl mood, no tremor  Assessment/Plan: Active Problems:   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[facility])   Altered mental status  Briana Sweeney is a 48 year old femalewith a history of alcoholic cirrhosis,hepatitis c s/p treatment, chronic thrombocytopenia, alcohol use disorder,prior alcohol withdrawals, seizures, migraines,depression, andschizophrenia who presented to the ED withaltered mental status.CT headwasnegative. Ethanol level was >300.   Chronic thrombocytopeniaand leukopenia 2/2 etoh: Platelets43 on admission, decreased to  10. S/p 1U platelets. Given etiology of suppression due to alcohol and not immune distruction, steroids do not have a role her. There are no good long term options beyond alcohol cessation to increase platelets/decrease bleeding risk; Short term medications are intended for cases of active bleeding. - Platelets stable at 12-13 for several days, would like them to be > 20 prior to discharge, but her other medical conditions are stablizing and it may be appropriate to follow up PCP for recheck/monitoring. - leukopenia stable at 2.3 - No concern for acute bleeding at this time as her headache, back pain, and abdominal pain are stable. Low threshold to obtain imaging if nature or severity of pain changes. - If patient fevers, she will need broad spectrum abx including pseudomonal coverage based on her leukopenia -Daily CBCs. Transfuse for platelets </=10  Anxiety:Improved with addition of valium. Some afternoon symptoms, will increase AM dose to '10mg'$ .  - Continue home prozac, hydroxyzine '25mg'$  q6hrs prn, and trazodone '100mg'$  qhs prn - Diazepam '10mg'$  qAM, '5mg'$  qPM  - PO Ativan '1mg'$  q8h PRN  Alcohol Use Disorder: Last drink was 3/27.Completed withdrawal, now off CIWA. Receiving Valium and PRN ativan as above. - Counseled on alcohol cessation, especially given risk of complications from novel coronavirus, other infections, and low platelets. - Has been offered naltrexone therapy, will prescribe at discharge - CIWA discontinued - Thiamine, folic acid, multivitamin  Acute  encephalopathy Alcoholic cirrhosis -Alert and oriented. Mood improved with valium.Ddx includedalcohol withdrawal, hepatic encephalopathy, Wernicke's encephalopathy, and cerebellar degeneration 2/2 chronic etoh use. - GGT elevated, therefore elevated alk phos is likely hepatic in origin. However, she is on phenytoin which can also elevate GGT - encourage lactulose use, TID dosing, titrate to 2-3 BMs daily   - Completed 7 Days IV  thiamine, Now PO '100mg'$  Daily   Seizures:continue home phenytoin '300mg'$  daily, levels undetectable at admission Migraines:continue home topamax '50mg'$  daily  Dispo: Anticipated discharge in approximately 1 day(s). Will arrange [facility] PT/OT.   Neva Seat, MD 12/13/2018, 9:26 AM

## 2018-12-14 LAB — CBC
HCT: 40.8 % (ref 36.0–46.0)
Hemoglobin: 13.2 g/dL (ref 12.0–15.0)
MCH: 30.6 pg (ref 26.0–34.0)
MCHC: 32.4 g/dL (ref 30.0–36.0)
MCV: 94.4 fL (ref 80.0–100.0)
Platelets: 14 10*3/uL — CL (ref 150–400)
RBC: 4.32 MIL/uL (ref 3.87–5.11)
RDW: 17.9 % — ABNORMAL HIGH (ref 11.5–15.5)
WBC: 2.2 10*3/uL — ABNORMAL LOW (ref 4.0–10.5)
nRBC: 0 % (ref 0.0–0.2)

## 2018-12-14 MED ORDER — NALTREXONE HCL 50 MG PO TABS: 50.0000 mg | ORAL_TABLET | Freq: Every day | ORAL | 0 refills | Status: DC

## 2018-12-14 MED ORDER — LACTULOSE 10 GM/15ML PO SOLN: 20.0000 g | Freq: Three times a day (TID) | ORAL | 3 refills | Status: DC

## 2018-12-14 NOTE — Progress Notes (Signed)
Occupational Therapy Treatment Patient Details Name: Briana Sweeney MRN: [id number] DOB: 04-17-1971 Today's Date: 12/14/2018    History of present illness Pt is a 48 y/o female presenting to the ED with AMS, HA. CT head and CXR negative. Note recent admission for GI bleed, alcoholic cirrhosis, and thrombocytopenia. Admitted for acute alcoholic encephalopathy. PMH alcoholic cirrhosis, hepatitis, thrombocytopenia, seizures, alcohol disorder, schizophrenia, bipolar disorder   OT comments  Pt is making steady improvement. Continue to recommend [facility] if possible to address IADL, medication management and home safety in addition to exploring leisure options. Pt states her vision is blurry - recommend she follow up with her eye doctor when able for a full visual assessment. Pt states she has Medicaid. If she does not, she could use [facility] as a resource for vision care. All further OT to be addressed by [place].   Follow Up Recommendations  Home health OT;Supervision - Intermittent    Equipment Recommendations  None recommended by OT(pt states she has a shower chair she can use)    Recommendations for Other Services      Precautions / Restrictions Precautions Precautions: Fall Restrictions Weight Bearing Restrictions: No       Mobility Bed Mobility Overal bed mobility: Independent                Transfers Overall transfer level: Needs assistance   Transfers: Sit to/from Stand Sit to Stand: Supervision              Balance Overall balance assessment: Needs assistance   Sitting balance-Leahy Scale: Good       Standing balance-Leahy Scale: Fair Standing balance comment: unsteady at times                           ADL either performed or assessed with clinical judgement   ADL                                       Functional mobility during ADLs: Supervision/safety General ADL Comments: Able to complete LB dressing with set up;  Educated pt on need to sit as much as possible wwhen dressing; recommend pt use shower seat     Vision   Additional Comments: States distance vision is "blurry". Recommend pt set up appointment with her eye doctor for a full visual assessment.    Perception     Praxis      Cognition Arousal/Alertness: Awake/alert Behavior During Therapy: Flat affect Overall Cognitive Status: No family/caregiver present to determine baseline cognitive functioning                                 General Comments: flat affect; Decreased safety/judgemetn        Exercises     Shoulder Instructions       General Comments      Pertinent Vitals/ Pain       Pain Assessment: Faces Faces Pain Scale: Hurts a little bit Pain Location: general complaints of pain Pain Descriptors / Indicators: Sore Pain Intervention(s): Limited activity within patient's tolerance  Home Living  Prior Functioning/Environment              Frequency  Min 3X/week        Progress Toward Goals  OT Goals(current goals can now be found in the care plan section)  Progress towards OT goals: Progressing toward goals  Acute Rehab OT Goals Patient Stated Goal: none stated  OT Goal Formulation: With patient Time For Goal Achievement: 12/23/18 Potential to Achieve Goals: Good ADL Goals Pt Will Perform Lower Body Dressing: with modified independence Pt Will Perform Toileting - Clothing Manipulation and hygiene: with modified independence Pt/caregiver will Perform Home Exercise Program: Increased strength;Both right and left upper extremity;With written HEP provided Additional ADL Goal #1: pt will increase to modiified independence with ADL functional transfers and mobility up to a community distance with fair balance and good safety awareness.  Plan Discharge plan remains appropriate(all further OT to be addressed by HHOT)     Co-evaluation                 AM-PAC OT "6 Clicks" Daily Activity     Outcome Measure   Help from another person eating meals?: None Help from another person taking care of personal grooming?: None Help from another person toileting, which includes using toliet, bedpan, or urinal?: None Help from another person bathing (including washing, rinsing, drying)?: A Little Help from another person to put on and taking off regular upper body clothing?: None Help from another person to put on and taking off regular lower body clothing?: A Little 6 Click Score: 22    End of Session    OT Visit Diagnosis: Unsteadiness on feet (R26.81)   Activity Tolerance Patient tolerated treatment well   Patient Left in bed;with call bell/phone within reach   Nurse Communication Mobility status        Time: 0950-1003 OT Time Calculation (min): 13 min  Charges: OT General Charges $OT Visit: 1 Visit OT Treatments $Self Care/Home Management : 8-22 mins  Maurie Boettcher, OT/L   Acute OT Clinical Specialist [facility] Pager [phone] Office [phone]    [facility] 12/14/2018, 10:52 AM

## 2018-12-14 NOTE — Progress Notes (Signed)
Patient has order to be D/C at home. She said that nobody can come to pick her up until 1:30 PM. Will continue to monitor.

## 2018-12-14 NOTE — Discharge Instructions (Signed)
Naltrexone tablets °What is this medicine? °NALTREXONE (nal TREX one) helps you to remain free of your dependence on opiate drugs or alcohol. It blocks the 'high' that these substances can give you. This medicine is combined with counseling and support groups. °This medicine may be used for other purposes; ask your health care provider or pharmacist if you have questions. °COMMON BRAND NAME(S): Depade, ReVia °What should I tell my health care provider before I take this medicine? °They need to know if you have any of these conditions: °-if you have used drugs or alcohol within 7 to 10 days °-kidney disease °-liver disease, including hepatitis °-an unusual or allergic reaction to naltrexone, other medicines, foods, dyes, or preservatives °-pregnant or trying to get pregnant °-breast-feeding °How should I use this medicine? °Take this medicine by mouth with a full glass of water. Follow the directions on the prescription label. Do not take this medicine within 7 to 10 days of taking any opioid drugs. Take your medicine at regular intervals. Do not take your medicine more often than directed. Do not stop taking except on your doctor's advice. °Talk to your pediatrician regarding the use of this medicine in children. Special care may be needed. °Overdosage: If you think you have taken too much of this medicine contact a poison control center or emergency room at once. °NOTE: This medicine is only for you. Do not share this medicine with others. °What if I miss a dose? °If you miss a dose and remember on the same day, take the missed dose. If you do not remember until the next day, ask your doctor or health care professional about rescheduling your doses. Do not take double or extra doses. °What may interact with this medicine? °Do not take this medicine with any of the following medications: °-any prescription or street opioid drug like codiene, heroin, methadone °This medicine may also interact with the following  medications: °-disulfiram °-thioridazine °This list may not describe all possible interactions. Give your health care provider a list of all the medicines, herbs, non-prescription drugs, or dietary supplements you use. Also tell them if you smoke, drink alcohol, or use illegal drugs. Some items may interact with your medicine. °What should I watch for while using this medicine? °Your condition will be monitored carefully while you are receiving this medicine. Visit your doctor or health care professional regularly. For this medicine to be most effective you should attend any counseling or support groups that your doctor or health care professional recommends. Do not try to overcome the effects of the medicine by taking large amounts of narcotics or by drinking large amounts of alcohol. This can cause severe problems including death. Also, you may be more sensitive to lower doses of narcotics after you stop taking this medicine. °If you are going to have surgery, tell your doctor or health care professional that you are taking this medicine. °Do not treat yourself for coughs, colds, pain, or diarrhea. Ask your doctor or health care professional for advice. Some of the ingredients may interact with this medicine and cause side effects. °Wear a medical ID bracelet or chain, and carry a card that describes your disease and details of your medicine and dosage times. °You may get drowsy or dizzy. Do not drive, use machinery, or do anything that needs mental alertness until you know how this medicine affects you. Do not stand or sit up quickly, especially if you are an older patient. This reduces the risk of dizzy or fainting spells.   Alcohol may interfere with the effect of this medicine. Avoid alcoholic drinks. °What side effects may I notice from receiving this medicine? °Side effects that you should report to your doctor or health care professional as soon as possible: °-allergic reactions like skin rash, itching or  hives, swelling of the face, lips, or tongue °-breathing problems °-changes in vision, hearing °-confusion °-dark urine °-depressed mood °-diarrhea °-fast or irregular heart beat °-hallucination, loss of contact with reality °-light-colored stools °-right upper belly pain °-suicidal thoughts or other mood changes °-unusually weak or tired °-vomiting °-yellowing of the eyes or skin °Side effects that usually do not require medical attention (report to your doctor or health care professional if they continue or are bothersome): °-aches, pains °-change in sex drive or performance °-feeling anxious °-headache °-loss of appetite, nausea °-runny nose, sinus problems, sneezing °-stomach pain °-trouble sleeping °This list may not describe all possible side effects. Call your doctor for medical advice about side effects. You may report side effects to FDA at 1-800-FDA-1088. °Where should I keep my medicine? °Keep out of the reach of children. °Store at room temperature between 20 and 25 degrees C (68 and 77 degrees F). Throw away any unused medicine after the expiration date. °NOTE: This sheet is a summary. It may not cover all possible information. If you have questions about this medicine, talk to your doctor, pharmacist, or health care provider. °© 2019 [employer] Standard (2012-06-18 10:33:18) ° °

## 2018-12-14 NOTE — Progress Notes (Signed)
Patient was discharged home with home health by MD order; discharged instructions review and give to patient with care notes; IV DIC; patient will be escorted to the car by a volunteer.

## 2018-12-14 NOTE — Telephone Encounter (Signed)
Tried to contact patient no answer and left a vm

## 2018-12-14 NOTE — Progress Notes (Signed)
   Subjective: HD#9   She feels well today and was able to get some sleep last night. Discussed plan to discharge home today with close pcp f/u for platelet check and f/u with psych for anxiety management. Reiterated the importance of alcohol cessation or at least trying to decrease the amount she drinks. This is the main way to allow her body to continue increasing platelet production. Will prescribe naltrexone at discharge.   Objective:  Vital signs in last 24 hours: Vitals:   12/13/18 1512 12/13/18 2147 12/14/18 0425 12/14/18 0513  BP: 101/71 98/74 (!) 88/61 101/72  Pulse: 71 73 64 62  Resp: _0 Temp: 98.1 F (36.7 C) 98 F (36.7 C) 98 F (36.7 C)   TempSrc:  Oral Oral   SpO2: 94% 96% 93%   Weight:      Height:       Gen: calm, NAD Resp: CTAB CV: RRR, no m/r/g Abd: soft, NT, ND, +BS Neuro: Alert and oriented, nl mood, no tremor  Assessment/Plan: Active Problems:   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HCC)   Altered mental status  [name] is a 48 year old femalewith a history of alcoholic cirrhosis,hepatitis c s/p treatment, chronic thrombocytopenia, alcohol use disorder,prior alcohol withdrawals, seizures, migraines,depression, andschizophrenia who presented to the ED withaltered mental status.CT headwasnegative. Ethanol level was >300.   Chronic thrombocytopeniaand leukopenia 2/2 etoh: Platelets43 on admission, decreased to 10. S/p 1U platelets. Given etiology of suppression due to alcohol and not immune distruction, steroids do not have a role her. There are no good long term options beyond alcohol cessation to increase platelets/decrease bleeding risk; Short term medications are intended for cases of active bleeding. - Platelets increased at 14, would like them to be > 20 prior to discharge, but her other medical conditions are stablizing and it may be appropriate to follow up PCP for recheck/monitoring. - leukopenia stable - No concern for acute bleeding  at this time as her headache, back pain, and abdominal pain are stable. Low threshold to obtain imaging if nature or severity of pain changes. - d/c home today   Anxiety:Improved with addition of valium. - Continue home prozac, hydroxyzine 68m q6hrs prn, and trazodone 1069mqhs prn - Diazepam 1019mAM, 5mg64mM  - PO Ativan 1mg 58m PRN - needs outpt psych f/u - will not discharge on benzos due to high risk with alcohol use   Alcohol Use Disorder: Last drink was 3/27.Completed withdrawal, now off CIWA. Receiving Valium and PRN ativan as above. - Counseled on alcohol cessation, especially given risk of complications from novel coronavirus, other infections, and low platelets. - Has been offered naltrexone therapy, will prescribe at discharge - CIWA discontinued - Thiamine, folic acid, multivitamin  Acute encephalopathy Alcoholic cirrhosis -Alert and oriented. Mood improved with valium.Ddx includedalcohol withdrawal, hepatic encephalopathy, Wernicke's encephalopathy, and cerebellar degeneration 2/2 chronic etoh use. - GGT elevated, therefore elevated alk phos is likely hepatic in origin. However, she is on phenytoin which can also elevate GGT - encourage lactulose use, TID dosing, titrate to 2-3 BMs daily   - Completed 7 Days IV thiamine, Now PO 100mg 77my   Seizures:continue home phenytoin 300mg d42m, levels undetectable at admission Migraines:continue home topamax 50mg da61m Dispo: Anticipated discharge today. [facility]  Briana Sweeney, MIsabelle Sweeney/2020, 6:55 AM

## 2018-12-15 ENCOUNTER — Other Ambulatory Visit: Payer: Self-pay | Admitting: Family Medicine

## 2018-12-17 ENCOUNTER — Other Ambulatory Visit: Payer: Self-pay

## 2018-12-17 ENCOUNTER — Ambulatory Visit (HOSPITAL_COMMUNITY)
Admit: 2018-12-17 | Discharge: 2018-12-17 | Disposition: A | Discharge status: Home / Self Care | Payer: Medicaid Other | Source: Ambulatory Visit | Attending: Family Medicine | Admitting: Family Medicine

## 2018-12-17 DIAGNOSIS — F1019 Alcohol abuse with unspecified alcohol-induced disorder: Secondary | ICD-10-CM | POA: Diagnosis not present

## 2018-12-17 DIAGNOSIS — D696 Thrombocytopenia, unspecified: Secondary | ICD-10-CM | POA: Diagnosis not present

## 2018-12-17 DIAGNOSIS — F419 Anxiety disorder, unspecified: Secondary | ICD-10-CM | POA: Diagnosis not present

## 2018-12-17 DIAGNOSIS — Z09 Encounter for follow-up examination after completed treatment for conditions other than malignant neoplasm: Secondary | ICD-10-CM | POA: Insufficient documentation

## 2018-12-17 LAB — COMPREHENSIVE METABOLIC PANEL
ALT: 31 U/L (ref 0–44)
AST: 61 U/L — ABNORMAL HIGH (ref 15–41)
Albumin: 3.4 g/dL — ABNORMAL LOW (ref 3.5–5.0)
Alkaline Phosphatase: 160 U/L — ABNORMAL HIGH (ref 38–126)
Anion gap: 7 (ref 5–15)
BUN: 10 mg/dL (ref 6–20)
CO2: 25 mmol/L (ref 22–32)
Calcium: 8.3 mg/dL — ABNORMAL LOW (ref 8.9–10.3)
Chloride: 107 mmol/L (ref 98–111)
Creatinine, Ser: 0.61 mg/dL (ref 0.44–1.00)
GFR calc Af Amer: >60 mL/min (ref >60)
GFR calc non Af Amer: >60 mL/min (ref >60)
Glucose, Bld: 85 mg/dL (ref 70–99)
Potassium: 3.9 mmol/L (ref 3.5–5.1)
Sodium: 139 mmol/L (ref 135–145)
Total Bilirubin: 0.8 mg/dL (ref 0.3–1.2)
Total Protein: 8.1 g/dL (ref 6.5–8.1)

## 2018-12-17 LAB — CBC WITH DIFFERENTIAL/PLATELET
Abs Immature Granulocytes: 0 10*3/uL (ref 0.00–0.07)
Basophils Absolute: 0 10*3/uL (ref 0.0–0.1)
Basophils Relative: 1 %
Eosinophils Absolute: 0.1 10*3/uL (ref 0.0–0.5)
Eosinophils Relative: 4 %
HCT: 45.1 % (ref 36.0–46.0)
Hemoglobin: 14.5 g/dL (ref 12.0–15.0)
Immature Granulocytes: 0 %
Lymphocytes Relative: 53 %
Lymphs Abs: 1 10*3/uL (ref 0.7–4.0)
MCH: 31.4 pg (ref 26.0–34.0)
MCHC: 32.2 g/dL (ref 30.0–36.0)
MCV: 97.6 fL (ref 80.0–100.0)
Monocytes Absolute: 0.3 10*3/uL (ref 0.1–1.0)
Monocytes Relative: 17 %
Neutro Abs: 0.5 10*3/uL — ABNORMAL LOW (ref 1.7–7.7)
Neutrophils Relative %: 25 %
Platelets: 21 10*3/uL — CL (ref 150–400)
RBC: 4.62 MIL/uL (ref 3.87–5.11)
RDW: 18.3 % — ABNORMAL HIGH (ref 11.5–15.5)
WBC: 1.9 10*3/uL — ABNORMAL LOW (ref 4.0–10.5)
nRBC: 0 % (ref 0.0–0.2)

## 2018-12-17 NOTE — Progress Notes (Signed)
PATIENT CARE CENTER NOTE   Provider:  Kathe Becton, FNP   Procedure: Lab draw- CBC and CMP   Note: Patient's labs drawn. Tolerate well. Alert, oriented and ambulatory at discharge.

## 2018-12-17 NOTE — Progress Notes (Signed)
CRITICAL VALUE ALERT  Critical Value:  Platelet 21 K/uL  Date & Time Notied: 12/17/2018; 11:41am  Provider Notified: Leanne Chang. NP  Orders Received/Actions taken: No new orders at this time. Number consistent with the previous one.

## 2018-12-21 MED FILL — hydrOXYzine HCL 25 MG TABS: 25 | 30 days supply | Qty: 90 | Fill #0

## 2018-12-21 MED FILL — NALTREXONE 50 MG TABLET: 50 | 30 days supply | Qty: 30 | Fill #0

## 2018-12-21 MED FILL — LACTULOSE 10 GM/15 ML SOLN: 10 | 5 days supply | Qty: 473 | Fill #0

## 2018-12-21 MED FILL — TOPIRAMATE 50 MG TABLET: 50 | 30 days supply | Qty: 30 | Fill #1

## 2018-12-22 ENCOUNTER — Other Ambulatory Visit: Payer: Self-pay

## 2018-12-22 ENCOUNTER — Telehealth: Payer: Self-pay

## 2018-12-22 ENCOUNTER — Ambulatory Visit (HOSPITAL_COMMUNITY)
Admission: RE | Admit: 2018-12-22 | Discharge: 2018-12-22 | Disposition: A | Discharge status: Home / Self Care | Payer: Medicaid Other | Source: Ambulatory Visit | Attending: Family Medicine | Admitting: Family Medicine

## 2018-12-22 ENCOUNTER — Ambulatory Visit (INDEPENDENT_AMBULATORY_CARE_PROVIDER_SITE_OTHER): Payer: PRIVATE HEALTH INSURANCE | Admitting: Family Medicine

## 2018-12-22 DIAGNOSIS — D696 Thrombocytopenia, unspecified: Secondary | ICD-10-CM | POA: Diagnosis not present

## 2018-12-22 DIAGNOSIS — F1019 Alcohol abuse with unspecified alcohol-induced disorder: Secondary | ICD-10-CM

## 2018-12-22 DIAGNOSIS — Z09 Encounter for follow-up examination after completed treatment for conditions other than malignant neoplasm: Secondary | ICD-10-CM | POA: Diagnosis not present

## 2018-12-22 DIAGNOSIS — F419 Anxiety disorder, unspecified: Secondary | ICD-10-CM | POA: Diagnosis not present

## 2018-12-22 LAB — CBC WITH DIFFERENTIAL/PLATELET
Abs Immature Granulocytes: 0.01 10*3/uL (ref 0.00–0.07)
Basophils Absolute: 0 10*3/uL (ref 0.0–0.1)
Basophils Relative: 1 %
Eosinophils Absolute: 0.2 10*3/uL (ref 0.0–0.5)
Eosinophils Relative: 8 %
HCT: 44.6 % (ref 36.0–46.0)
Hemoglobin: 14.4 g/dL (ref 12.0–15.0)
Immature Granulocytes: 0 %
Lymphocytes Relative: 35 %
Lymphs Abs: 1 10*3/uL (ref 0.7–4.0)
MCH: 30.8 pg (ref 26.0–34.0)
MCHC: 32.3 g/dL (ref 30.0–36.0)
MCV: 95.3 fL (ref 80.0–100.0)
Monocytes Absolute: 0.3 10*3/uL (ref 0.1–1.0)
Monocytes Relative: 9 %
Neutro Abs: 1.3 10*3/uL — ABNORMAL LOW (ref 1.7–7.7)
Neutrophils Relative %: 47 %
Platelets: 41 10*3/uL — ABNORMAL LOW (ref 150–400)
RBC: 4.68 MIL/uL (ref 3.87–5.11)
RDW: 17.2 % — ABNORMAL HIGH (ref 11.5–15.5)
WBC: 2.8 10*3/uL — ABNORMAL LOW (ref 4.0–10.5)
nRBC: 0 % (ref 0.0–0.2)

## 2018-12-22 NOTE — Telephone Encounter (Signed)
[facility] states that the Hydroxyzine,Lactulose,Topamax, and the Depade were put in the mail yesterday.

## 2018-12-22 NOTE — Progress Notes (Signed)
Virtual Visit via Telephone Note  I connected with Briana Sweeney on 12/22/18 at 10:40 AM EDT by telephone and verified that I am speaking with the correct person using two identifiers.   I discussed the limitations, risks, security and privacy concerns of performing an evaluation and management service by telephone and the availability of in person appointments. I also discussed with the patient that there may be a patient responsible charge related to this service. The patient expressed understanding and agreed to proceed.   History of Present Illness:  Past Medical History:  Diagnosis Date  . Anxiety   . Bipolar affective disorder ([place])    With anxiety features  . 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 Depression   . 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 ETOHism ([place])   . GERD (gastroesophageal reflux disease)   . Hematemesis 02/10/2018  . Hepatitis C 2018   hepatitis c and alcohol related hepatitis  . Heroin abuse ([place])   . History of blood transfusion    "blood doesn't clot; I fell down and had to have a transfusion"  . History of kidney stones   . Migraine    "when I get really stressed" (09/01/2017)  . Schizophrenia ([place])   . Seizures ([place])    "when I run out of my RX; lots recently" (09/01/2017)    Current Outpatient Medications on File Prior to Visit  Medication Sig Dispense Refill  . famotidine (PEPCID) 20 MG tablet Take 1 tablet (20 mg total) by mouth daily. 30 tablet 0  . FLUoxetine (PROZAC) 40 MG capsule Take 1 capsule (40 mg total) by mouth daily. 30 capsule 3  . folic acid (FOLVITE) 1 MG tablet Take 1 tablet (1 mg total) by mouth daily. 30 tablet 0  . gabapentin (NEURONTIN) 300 MG capsule Take 2 capsules 2 times a day. (Patient taking differently: Take 600 mg by mouth 2 (two) times daily. ) 120 capsule 3  . hydrOXYzine (ATARAX/VISTARIL) 25 MG tablet Take 1 tablet (25 mg total) by mouth every 6 (six) hours as needed for itching. 20  tablet 0  . naltrexone (DEPADE) 50 MG tablet Take 1 tablet (50 mg total) by mouth daily. 30 tablet 0  . phenytoin (DILANTIN) 300 MG ER capsule Take 1 capsule (300 mg total) by mouth daily. 30 capsule 2  . thiamine 100 MG tablet Take 1 tablet (100 mg total) by mouth daily. 30 tablet 0  . topiramate (TOPAMAX) 50 MG tablet Take 1 tablet (50 mg total) by mouth daily. 30 tablet 2  . traZODone (DESYREL) 50 MG tablet Take 1 tablet (50 mg total) by mouth at bedtime as needed for sleep. (Patient taking differently: Take 100 mg by mouth at bedtime. ) 30 tablet 3  . haloperidol (HALDOL) 2 MG tablet Take 2 mg by mouth at bedtime.    [name] Kitchen lactulose (CHRONULAC) 10 GM/15ML solution Take 30 mLs (20 g total) by mouth 3 (three) times daily. Goal to have 2-3 BMs daily 240 mL 3  . Multiple Vitamin (MULTIVITAMIN WITH MINERALS) TABS tablet Take 1 tablet by mouth daily. (Patient not taking: Reported on 11/20/2018)     No current facility-administered medications on file prior to visit.    Current Status: Since her last office visit, she is doing well with no complaints. She has been prescribed Naltrexone for Alcohol withdrawal. She recently missed appointment at [facility] with Pshchiatrist because she was in hospital at that time.  Her anxiety is moderate today. She denies suicidal ideations, homicidal ideations, or auditory hallucinations. She received 1 unit of Platelets on 12/15/2018, for platelet count of 12. Her most recent platelet count was at 21 on 12/17/2018. She denies bruising. She continues to deny petechiae, gingival bleeding, epistaxis, profuse bleeding from superficial cuts, melena, menorrhagia, mentrorrhagia, hematochezia, and hematuria. She states that her appetite is good. Denies abdominal pain, nausea, vomiting, diarrhea, and constipation.   She denies fevers, chills, fatigue, recent infections, weight loss, and night sweats. She has not had any headaches, visual changes, dizziness, and falls. No chest  pain, heart palpitations, cough and shortness of breath reported. No reports of GI problems such as constipation. She denies pain today.    Observations/Objective:  Telephone Virtual Visit   Assessment and Plan:  1. Hospital discharge follow-up  2. Thrombocytopenia ([place]) Her most recent platelet count was at 21 on 12/17/2018 at time of hospital discharge after receiving 1 unit of Platelets. Today she denies bruising. She continues to deny petechiae, gingival bleeding, epistaxis, profuse bleeding from superficial cuts, melena, menorrhagia, mentrorrhagia, hematochezia, and hematuria. She states that her appetite is good. Denies abdominal pain, nausea, vomiting, diarrhea, and constipation. We will schedule her for lab draw to re-evaluate Platelet count. We will continue to monitor.   3. Anxiety Stable today. Continue medications as prescribed.   4. Alcohol abuse with alcohol-induced disorder ([place]) She has recently relapsed on alcohol. She will continue newly prescribed medication, Naltrexone (Depade) to decrease cravings. We will continue to monitor.   No orders of the defined types were placed in this encounter.   No orders of the defined types were placed in this encounter.   Referral Orders  No referral(s) requested today    Briana Becton,  MSN, FNP-C Patient [place] [facility] [address] [place], [place] [address] [phone]   Follow Up Instructions:  She will follow up in 1 month for office visit.   She report to [facility] today for STAT lab draw to re-assess for Thrombocytopenia.    I discussed the assessment and treatment plan with the patient. The patient was provided an opportunity to ask questions and all were answered. The patient agreed with the plan and demonstrated an understanding of the instructions.   The patient was advised to call back or seek an in-person evaluation if the symptoms worsen or if the condition fails to  improve as anticipated.  I provided 15-20 minutes of non-face-to-face time during this encounter.   Briana Glatter, FNP

## 2018-12-22 NOTE — Telephone Encounter (Signed)
-----   Message from [name], Erin sent at 12/22/2018 11:38 AM EDT ----- Regarding: "Refills" Carrie,   Please contact [facility] to find out which medications are in the mail to be sent to patient. She is unsure. I will then refill others and send to [facility], [employer]. Thanks!

## 2018-12-23 ENCOUNTER — Other Ambulatory Visit: Payer: Self-pay | Admitting: Family Medicine

## 2018-12-23 DIAGNOSIS — F419 Anxiety disorder, unspecified: Secondary | ICD-10-CM

## 2018-12-23 DIAGNOSIS — G629 Polyneuropathy, unspecified: Secondary | ICD-10-CM

## 2018-12-23 DIAGNOSIS — G4700 Insomnia, unspecified: Secondary | ICD-10-CM

## 2018-12-23 DIAGNOSIS — R569 Unspecified convulsions: Secondary | ICD-10-CM

## 2018-12-23 DIAGNOSIS — F1019 Alcohol abuse with unspecified alcohol-induced disorder: Secondary | ICD-10-CM

## 2018-12-23 MED ORDER — THIAMINE HCL 100 MG PO TABS: 100.0000 mg | ORAL_TABLET | Freq: Every day | ORAL | 3 refills | Status: DC

## 2018-12-23 MED ORDER — HALOPERIDOL 2 MG PO TABS: 2.0000 mg | ORAL_TABLET | Freq: Every day | ORAL | 0 refills | Status: DC

## 2018-12-23 MED ORDER — TRAZODONE HCL 50 MG PO TABS: 100.0000 mg | ORAL_TABLET | Freq: Every day | ORAL | 3 refills | Status: DC

## 2018-12-23 MED ORDER — FOLIC ACID 1 MG PO TABS: 1.0000 mg | ORAL_TABLET | Freq: Every day | ORAL | 3 refills | Status: DC

## 2018-12-23 MED ORDER — GABAPENTIN 300 MG PO CAPS: ORAL_CAPSULE | ORAL | 3 refills | Status: DC

## 2018-12-23 MED ORDER — FLUOXETINE HCL 40 MG PO CAPS: 40.0000 mg | ORAL_CAPSULE | Freq: Every day | ORAL | 3 refills | Status: DC

## 2018-12-23 MED ORDER — PHENYTOIN SODIUM EXTENDED 300 MG PO CAPS: 300.0000 mg | ORAL_CAPSULE | Freq: Every day | ORAL | 3 refills | Status: DC

## 2018-12-23 NOTE — Telephone Encounter (Signed)
Patient notified and will contact our office if she experience any symptoms. Patient states that she has a appointment with psychiatrist on Friday.

## 2018-12-24 ENCOUNTER — Encounter: Payer: Self-pay | Admitting: Family Medicine

## 2018-12-29 ENCOUNTER — Ambulatory Visit (HOSPITAL_COMMUNITY): Payer: Self-pay | Admitting: Psychiatry

## 2019-01-05 ENCOUNTER — Ambulatory Visit (HOSPITAL_COMMUNITY): Payer: Self-pay | Admitting: Psychiatry

## 2019-01-08 ENCOUNTER — Telehealth: Payer: Self-pay

## 2019-01-11 NOTE — Telephone Encounter (Signed)
Tried to contact patient no answer 

## 2019-01-12 NOTE — Telephone Encounter (Signed)
Tried to contact patient no answer and vm is full

## 2019-01-13 NOTE — Telephone Encounter (Signed)
Left a vm for patient to callback 

## 2019-01-14 NOTE — Telephone Encounter (Signed)
Message sent to provider 

## 2019-01-20 ENCOUNTER — Encounter (HOSPITAL_COMMUNITY): Payer: Self-pay | Admitting: *Deleted

## 2019-01-20 ENCOUNTER — Ambulatory Visit (HOSPITAL_COMMUNITY): Payer: Medicaid Other | Admitting: Psychiatry

## 2019-01-20 ENCOUNTER — Other Ambulatory Visit: Payer: Self-pay

## 2019-01-20 ENCOUNTER — Emergency Department (HOSPITAL_COMMUNITY): Payer: Medicaid Other

## 2019-01-20 ENCOUNTER — Inpatient Hospital Stay (HOSPITAL_COMMUNITY)
Admission: EM | Admit: 2019-01-20 | Discharge: 2019-01-26 | DRG: 813 | Disposition: A | Discharge status: Home / Self Care | Payer: Medicaid Other | Attending: Internal Medicine | Admitting: Internal Medicine

## 2019-01-20 DIAGNOSIS — W010XXA Fall on same level from slipping, tripping and stumbling without subsequent striking against object, initial encounter: Secondary | ICD-10-CM | POA: Diagnosis present

## 2019-01-20 DIAGNOSIS — Z79891 Long term (current) use of opiate analgesic: Secondary | ICD-10-CM

## 2019-01-20 DIAGNOSIS — Y908 Blood alcohol level of 240 mg/100 ml or more: Secondary | ICD-10-CM | POA: Diagnosis present

## 2019-01-20 DIAGNOSIS — Z811 Family history of alcohol abuse and dependence: Secondary | ICD-10-CM

## 2019-01-20 DIAGNOSIS — D696 Thrombocytopenia, unspecified: Principal | ICD-10-CM

## 2019-01-20 DIAGNOSIS — K219 Gastro-esophageal reflux disease without esophagitis: Secondary | ICD-10-CM | POA: Diagnosis present

## 2019-01-20 DIAGNOSIS — F1010 Alcohol abuse, uncomplicated: Secondary | ICD-10-CM | POA: Diagnosis present

## 2019-01-20 DIAGNOSIS — R569 Unspecified convulsions: Secondary | ICD-10-CM | POA: Diagnosis present

## 2019-01-20 DIAGNOSIS — Z7141 Alcohol abuse counseling and surveillance of alcoholic: Secondary | ICD-10-CM

## 2019-01-20 DIAGNOSIS — W19XXXA Unspecified fall, initial encounter: Secondary | ICD-10-CM | POA: Diagnosis present

## 2019-01-20 DIAGNOSIS — Z801 Family history of malignant neoplasm of trachea, bronchus and lung: Secondary | ICD-10-CM | POA: Diagnosis not present

## 2019-01-20 DIAGNOSIS — K7290 Hepatic failure, unspecified without coma: Secondary | ICD-10-CM | POA: Diagnosis present

## 2019-01-20 DIAGNOSIS — K7030 Alcoholic cirrhosis of liver without ascites: Secondary | ICD-10-CM | POA: Diagnosis present

## 2019-01-20 DIAGNOSIS — Z1159 Encounter for screening for other viral diseases: Secondary | ICD-10-CM | POA: Diagnosis not present

## 2019-01-20 DIAGNOSIS — F10220 Alcohol dependence with intoxication, uncomplicated: Secondary | ICD-10-CM | POA: Diagnosis present

## 2019-01-20 DIAGNOSIS — K7010 Alcoholic hepatitis without ascites: Secondary | ICD-10-CM | POA: Diagnosis present

## 2019-01-20 DIAGNOSIS — R768 Other specified abnormal immunological findings in serum: Secondary | ICD-10-CM | POA: Diagnosis not present

## 2019-01-20 DIAGNOSIS — R188 Other ascites: Secondary | ICD-10-CM

## 2019-01-20 DIAGNOSIS — S0101XA Laceration without foreign body of scalp, initial encounter: Secondary | ICD-10-CM | POA: Diagnosis present

## 2019-01-20 DIAGNOSIS — F419 Anxiety disorder, unspecified: Secondary | ICD-10-CM | POA: Diagnosis present

## 2019-01-20 DIAGNOSIS — Z90710 Acquired absence of both cervix and uterus: Secondary | ICD-10-CM | POA: Diagnosis not present

## 2019-01-20 DIAGNOSIS — G8929 Other chronic pain: Secondary | ICD-10-CM | POA: Diagnosis present

## 2019-01-20 DIAGNOSIS — Z915 Personal history of self-harm: Secondary | ICD-10-CM

## 2019-01-20 DIAGNOSIS — Z79899 Other long term (current) drug therapy: Secondary | ICD-10-CM

## 2019-01-20 DIAGNOSIS — F319 Bipolar disorder, unspecified: Secondary | ICD-10-CM | POA: Diagnosis present

## 2019-01-20 DIAGNOSIS — K7460 Unspecified cirrhosis of liver: Secondary | ICD-10-CM | POA: Diagnosis present

## 2019-01-20 DIAGNOSIS — F10920 Alcohol use, unspecified with intoxication, uncomplicated: Secondary | ICD-10-CM | POA: Diagnosis not present

## 2019-01-20 DIAGNOSIS — F1910 Other psychoactive substance abuse, uncomplicated: Secondary | ICD-10-CM | POA: Diagnosis present

## 2019-01-20 DIAGNOSIS — S0990XA Unspecified injury of head, initial encounter: Secondary | ICD-10-CM

## 2019-01-20 DIAGNOSIS — R040 Epistaxis: Secondary | ICD-10-CM | POA: Diagnosis present

## 2019-01-20 DIAGNOSIS — F209 Schizophrenia, unspecified: Secondary | ICD-10-CM | POA: Diagnosis present

## 2019-01-20 DIAGNOSIS — B1920 Unspecified viral hepatitis C without hepatic coma: Secondary | ICD-10-CM | POA: Diagnosis present

## 2019-01-20 DIAGNOSIS — R112 Nausea with vomiting, unspecified: Secondary | ICD-10-CM

## 2019-01-20 HISTORY — DX: Epistaxis: R04.0

## 2019-01-20 LAB — CBC WITH DIFFERENTIAL/PLATELET
Abs Immature Granulocytes: 0 10*3/uL (ref 0.00–0.07)
Basophils Absolute: 0 10*3/uL (ref 0.0–0.1)
Basophils Relative: 1 %
Eosinophils Absolute: 0.1 10*3/uL (ref 0.0–0.5)
Eosinophils Relative: 3 %
HCT: 42.8 % (ref 36.0–46.0)
Hemoglobin: 14.3 g/dL (ref 12.0–15.0)
Immature Granulocytes: 0 %
Lymphocytes Relative: 51 %
Lymphs Abs: 1.5 10*3/uL (ref 0.7–4.0)
MCH: 31.4 pg (ref 26.0–34.0)
MCHC: 33.4 g/dL (ref 30.0–36.0)
MCV: 94.1 fL (ref 80.0–100.0)
Monocytes Absolute: 0.4 10*3/uL (ref 0.1–1.0)
Monocytes Relative: 12 %
Neutro Abs: 1 10*3/uL — ABNORMAL LOW (ref 1.7–7.7)
Neutrophils Relative %: 33 %
Platelets: 16 10*3/uL — CL (ref 150–400)
RBC: 4.55 MIL/uL (ref 3.87–5.11)
RDW: 17.6 % — ABNORMAL HIGH (ref 11.5–15.5)
WBC: 3 10*3/uL — ABNORMAL LOW (ref 4.0–10.5)
nRBC: 0 % (ref 0.0–0.2)

## 2019-01-20 LAB — BASIC METABOLIC PANEL
Anion gap: 16 — ABNORMAL HIGH (ref 5–15)
BUN: 6 mg/dL (ref 6–20)
CO2: 18 mmol/L — ABNORMAL LOW (ref 22–32)
Calcium: 8.3 mg/dL — ABNORMAL LOW (ref 8.9–10.3)
Chloride: 107 mmol/L (ref 98–111)
Creatinine, Ser: 0.66 mg/dL (ref 0.44–1.00)
GFR calc Af Amer: >60 mL/min (ref >60)
GFR calc non Af Amer: >60 mL/min (ref >60)
Glucose, Bld: 126 mg/dL — ABNORMAL HIGH (ref 70–99)
Potassium: 3.8 mmol/L (ref 3.5–5.1)
Sodium: 141 mmol/L (ref 135–145)

## 2019-01-20 LAB — SARS CORONAVIRUS 2 BY RT PCR (HOSPITAL ORDER, PERFORMED IN ~~LOC~~ HOSPITAL LAB): SARS Coronavirus 2: NEGATIVE

## 2019-01-20 LAB — ETHANOL: Alcohol, Ethyl (B): 436 mg/dL (ref <10)

## 2019-01-20 MED ORDER — NALTREXONE HCL 50 MG PO TABS
50.0000 mg | ORAL_TABLET | Freq: Every day | ORAL | Status: DC
  Administered 2019-01-21 – 2019-01-26 (×6): 50 mg via ORAL
  Filled 2019-01-20 (×6): qty 1

## 2019-01-20 MED ORDER — LORAZEPAM 1 MG PO TABS
1.0000 mg | ORAL_TABLET | Freq: Four times a day (QID) | ORAL | Status: DC | PRN
  Administered 2019-01-21 – 2019-01-22 (×4): 1 mg via ORAL
  Filled 2019-01-20 (×4): qty 1

## 2019-01-20 MED ORDER — FOLIC ACID 1 MG PO TABS
1.0000 mg | ORAL_TABLET | Freq: Every day | ORAL | Status: DC
  Administered 2019-01-21 – 2019-01-26 (×6): 1 mg via ORAL
  Filled 2019-01-20 (×6): qty 1

## 2019-01-20 MED ORDER — LORAZEPAM 2 MG/ML IJ SOLN
1.0000 mg | Freq: Four times a day (QID) | INTRAMUSCULAR | Status: DC | PRN
  Administered 2019-01-21 – 2019-01-22 (×2): 1 mg via INTRAVENOUS
  Filled 2019-01-20 (×2): qty 1

## 2019-01-20 MED ORDER — THIAMINE HCL 100 MG/ML IJ SOLN
Freq: Once | INTRAVENOUS | Status: AC
  Administered 2019-01-21: 01:00:00 via INTRAVENOUS
  Filled 2019-01-20: qty 1000

## 2019-01-20 MED ORDER — HALOPERIDOL 2 MG PO TABS
2.0000 mg | ORAL_TABLET | Freq: Every day | ORAL | Status: DC
  Administered 2019-01-21 – 2019-01-25 (×6): 2 mg via ORAL
  Filled 2019-01-20 (×7): qty 1

## 2019-01-20 MED ORDER — FOLIC ACID 1 MG PO TABS: 1.0000 mg | ORAL_TABLET | Freq: Every day | ORAL | Status: DC

## 2019-01-20 MED ORDER — ADULT MULTIVITAMIN W/MINERALS CH
1.0000 | ORAL_TABLET | Freq: Every day | ORAL | Status: DC
  Administered 2019-01-21 – 2019-01-26 (×6): 1 via ORAL
  Filled 2019-01-20 (×6): qty 1

## 2019-01-20 MED ORDER — SODIUM CHLORIDE 0.9 % IV SOLN
10.0000 mL/h | Freq: Once | INTRAVENOUS | Status: AC
  Administered 2019-01-20: 10 mL/h via INTRAVENOUS

## 2019-01-20 MED ORDER — FLUOXETINE HCL 20 MG PO CAPS
40.0000 mg | ORAL_CAPSULE | Freq: Every day | ORAL | Status: DC
  Administered 2019-01-21 – 2019-01-26 (×6): 40 mg via ORAL
  Filled 2019-01-20 (×6): qty 2

## 2019-01-20 MED ORDER — THIAMINE HCL 100 MG/ML IJ SOLN: 100.0000 mg | Freq: Every day | INTRAMUSCULAR | Status: DC

## 2019-01-20 MED ORDER — THIAMINE HCL 100 MG PO TABS: 100.0000 mg | ORAL_TABLET | Freq: Every day | ORAL | Status: DC

## 2019-01-20 MED ORDER — GABAPENTIN 300 MG PO CAPS
600.0000 mg | ORAL_CAPSULE | Freq: Two times a day (BID) | ORAL | Status: DC
  Administered 2019-01-21 – 2019-01-26 (×12): 600 mg via ORAL
  Filled 2019-01-20 (×12): qty 2

## 2019-01-20 MED ORDER — ONDANSETRON HCL 4 MG PO TABS
4.0000 mg | ORAL_TABLET | Freq: Four times a day (QID) | ORAL | Status: DC | PRN
  Administered 2019-01-21: 4 mg via ORAL
  Filled 2019-01-20: qty 1

## 2019-01-20 MED ORDER — ADULT MULTIVITAMIN W/MINERALS CH: 1.0000 | ORAL_TABLET | Freq: Every day | ORAL | Status: DC

## 2019-01-20 MED ORDER — PHENYTOIN SODIUM EXTENDED 100 MG PO CAPS
300.0000 mg | ORAL_CAPSULE | Freq: Every day | ORAL | Status: DC
  Administered 2019-01-21 – 2019-01-26 (×6): 300 mg via ORAL
  Filled 2019-01-20 (×6): qty 3

## 2019-01-20 MED ORDER — TOPIRAMATE 25 MG PO TABS
50.0000 mg | ORAL_TABLET | Freq: Every day | ORAL | Status: DC
  Administered 2019-01-21 – 2019-01-26 (×6): 50 mg via ORAL
  Filled 2019-01-20 (×9): qty 2

## 2019-01-20 MED ORDER — VITAMIN B-1 100 MG PO TABS
100.0000 mg | ORAL_TABLET | Freq: Every day | ORAL | Status: DC
  Administered 2019-01-21 – 2019-01-26 (×6): 100 mg via ORAL
  Filled 2019-01-20 (×6): qty 1

## 2019-01-20 MED ORDER — FAMOTIDINE 20 MG PO TABS
20.0000 mg | ORAL_TABLET | Freq: Every day | ORAL | Status: DC
  Administered 2019-01-21 – 2019-01-26 (×6): 20 mg via ORAL
  Filled 2019-01-20 (×6): qty 1

## 2019-01-20 MED ORDER — HYDROXYZINE HCL 25 MG PO TABS
25.0000 mg | ORAL_TABLET | Freq: Four times a day (QID) | ORAL | Status: DC | PRN
  Administered 2019-01-21: 08:00:00 25 mg via ORAL
  Filled 2019-01-20: qty 1

## 2019-01-20 MED ORDER — TRAZODONE HCL 100 MG PO TABS
100.0000 mg | ORAL_TABLET | Freq: Every day | ORAL | Status: DC
  Administered 2019-01-21 – 2019-01-25 (×6): 100 mg via ORAL
  Filled 2019-01-20 (×6): qty 1

## 2019-01-20 MED ORDER — ONDANSETRON HCL 4 MG/2ML IJ SOLN
4.0000 mg | Freq: Once | INTRAMUSCULAR | Status: DC
  Filled 2019-01-20: qty 2

## 2019-01-20 MED ORDER — LIDOCAINE-EPINEPHRINE (PF) 2 %-1:200000 IJ SOLN
10.0000 mL | Freq: Once | INTRAMUSCULAR | Status: DC
  Filled 2019-01-20: qty 20

## 2019-01-20 MED ORDER — ONDANSETRON 4 MG PO TBDP
4.0000 mg | ORAL_TABLET | Freq: Once | ORAL | Status: AC
  Administered 2019-01-20: 20:00:00 4 mg via ORAL
  Filled 2019-01-20: qty 1

## 2019-01-20 MED ORDER — LACTULOSE 10 GM/15ML PO SOLN
20.0000 g | Freq: Three times a day (TID) | ORAL | Status: DC
  Administered 2019-01-21 – 2019-01-23 (×5): 20 g via ORAL
  Filled 2019-01-20 (×13): qty 30

## 2019-01-20 MED ORDER — ONDANSETRON HCL 4 MG/2ML IJ SOLN: 4.0000 mg | Freq: Four times a day (QID) | INTRAMUSCULAR | Status: DC | PRN

## 2019-01-20 NOTE — ED Notes (Signed)
Head cleaned up, lac visualized, gauze wrapped to head for temporary control of bleeding

## 2019-01-20 NOTE — ED Notes (Signed)
Pt's significant other Albertha Ghee [phone]. Pls contact for updates

## 2019-01-20 NOTE — ED Notes (Signed)
Report attempted 

## 2019-01-20 NOTE — ED Triage Notes (Signed)
Pt in stating she tripped and fell and hit her head, ETOH, positive LOC, speech is noted to be slurred, laceration to head noted, pt oriented to person and place, disoriented to year, GCS 14

## 2019-01-20 NOTE — ED Notes (Signed)
ED TO INPATIENT HANDOFF REPORT  ED Nurse Name and Phone #: Benjamine Mola [phone]  S Name/Age/Gender Briana Sweeney 48 y.o. female Room/Bed: 024C/024C  Code Status   Code Status: Prior  Home/SNF/Other Home Patient oriented to: self, place and time Is this baseline? No   Triage Complete: Triage complete  Chief Complaint Loss of balance, fall, head lac, passed out  Triage Note Pt in stating she tripped and fell and hit her head, ETOH, positive LOC, speech is noted to be slurred, laceration to head noted, pt oriented to person and place, disoriented to year, GCS 14   Allergies No Known Allergies  Level of Care/Admitting Diagnosis ED Disposition    ED Disposition Condition [place]: [place] [100100]  Level of Care: Telemetry Medical [104]  Covid Evaluation: N/A  Diagnosis: Fall [290176]  Admitting Physician: Elwyn Reach [[id number]]  Attending Physician: Elwyn Reach [[id number]]  Estimated length of stay: past midnight tomorrow  Certification:: I certify this patient will need inpatient services for at least 2 midnights  PT Class (Do Not Modify): Inpatient [101]  PT Acc Code (Do Not Modify): Private [1]       B Medical/Surgery History Past Medical History:  Diagnosis Date  . Anxiety   . Bipolar affective disorder ([place])    With anxiety features  . 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 Depression   . 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 ETOHism ([place])   . GERD (gastroesophageal reflux disease)   . Hematemesis 02/10/2018  . Hepatitis C 2018   hepatitis c and alcohol related hepatitis  . Heroin abuse ([place])   . History of blood transfusion    "blood doesn't clot; I fell down and had to have a transfusion"  . History of kidney stones   . Migraine    "when I get really stressed" (09/01/2017)  . Schizophrenia ([place])   . Seizures ([place])    "when I run out of my RX; lots recently" (09/01/2017)    Past Surgical History:  Procedure Laterality Date  . ESOPHAGOGASTRODUODENOSCOPY N/A 09/03/2017   Procedure: ESOPHAGOGASTRODUODENOSCOPY (EGD);  Surgeon: Doran Stabler, MD;  Location: [facility];  Service: Gastroenterology;  Laterality: N/A;  . FINGER FRACTURE SURGERY Left    "shattered my pinky"  . FRACTURE SURGERY    . I&D EXTREMITY Left 09/18/2018   Procedure: IRRIGATION AND DEBRIDEMENT EXTREMITY;  Surgeon: Leanora Cover, MD;  Location: [facility];  Service: Orthopedics;  Laterality: Left;  . IR PARACENTESIS  07/23/2017  . IR PARACENTESIS  07/2017   "did it twice in the same week" (09/01/2017)  . SHOULDER OPEN ROTATOR CUFF REPAIR Right   . TUBAL LIGATION    . VAGINAL HYSTERECTOMY       A IV Location/Drains/Wounds Patient Lines/Drains/Airways Status   Active Line/Drains/Airways    Name:   Placement date:   Placement time:   Site:   Days:   Peripheral IV 01/20/19 Left Wrist   01/20/19    2117    Wrist   less than 1   Incision (Closed) 09/18/18 Elbow Left   09/18/18    1827     124   Wound / Incision (Open or Dehisced) 09/21/18 Incision - Open Elbow Left;Lateral   09/21/18    1130    Elbow   121          Intake/Output Last 24 hours No intake or  output data in the 24 hours ending 01/20/19 2149  Labs/Imaging Results for orders placed or performed during the hospital encounter of 01/20/19 (from the past 48 hour(s))  CBC with Differential/Platelet     Status: Abnormal   Collection Time: 01/20/19  7:42 PM  Result Value Ref Range   WBC 3.0 (L) 4.0 - 10.5 K/uL   RBC 4.55 3.87 - 5.11 MIL/uL   Hemoglobin 14.3 12.0 - 15.0 g/dL   HCT 42.8 36.0 - 46.0 %   MCV 94.1 80.0 - 100.0 fL   MCH 31.4 26.0 - 34.0 pg   MCHC 33.4 30.0 - 36.0 g/dL   RDW 17.6 (H) 11.5 - 15.5 %   Platelets 16 (LL) 150 - 400 K/uL    Comment: REPEATED TO VERIFY PLATELET COUNT CONFIRMED BY SMEAR Immature Platelet Fraction may be clinically indicated, consider ordering this additional test [id number] THIS  CRITICAL RESULT HAS VERIFIED AND BEEN CALLED TO Savyon Loken,RN BY ZELDA BEECH ON 05 13 2020 AT 2012, AND HAS BEEN READ BACK.     nRBC 0.0 0.0 - 0.2 %   Neutrophils Relative % 33 %   Neutro Abs 1.0 (L) 1.7 - 7.7 K/uL   Lymphocytes Relative 51 %   Lymphs Abs 1.5 0.7 - 4.0 K/uL   Monocytes Relative 12 %   Monocytes Absolute 0.4 0.1 - 1.0 K/uL   Eosinophils Relative 3 %   Eosinophils Absolute 0.1 0.0 - 0.5 K/uL   Basophils Relative 1 %   Basophils Absolute 0.0 0.0 - 0.1 K/uL   Smear Review MORPHOLOGY UNREMARKABLE    Immature Granulocytes 0 %   Abs Immature Granulocytes 0.00 0.00 - 0.07 K/uL    Comment: Performed at [facility] [address], [place], [place] [address]  Basic metabolic panel     Status: Abnormal   Collection Time: 01/20/19  7:42 PM  Result Value Ref Range   Sodium 141 135 - 145 mmol/L   Potassium 3.8 3.5 - 5.1 mmol/L   Chloride 107 98 - 111 mmol/L   CO2 18 (L) 22 - 32 mmol/L   Glucose, Bld 126 (H) 70 - 99 mg/dL   BUN 6 6 - 20 mg/dL   Creatinine, Ser 0.66 0.44 - 1.00 mg/dL   Calcium 8.3 (L) 8.9 - 10.3 mg/dL   GFR calc non Af Amer >60 >60 mL/min   GFR calc Af Amer >60 >60 mL/min   Anion gap 16 (H) 5 - 15    Comment: Performed at [facility], [place] [address], [place], [place] [address]  Ethanol     Status: Abnormal   Collection Time: 01/20/19  7:42 PM  Result Value Ref Range   Alcohol, Ethyl (B) 436 (HH) <10 mg/dL    Comment: CRITICAL RESULT CALLED TO, READ BACK BY AND VERIFIED WITH: E.Hussam Muniz RN 2020 01/20/2019 [place] K (NOTE) Lowest detectable limit for serum alcohol is 10 mg/dL. For medical purposes only. Performed at [facility], [place] [address], [place], [place] [address]   Prepare Pheresed Platelets     Status: None (Preliminary result)   Collection Time: 01/20/19  8:33 PM  Result Value Ref Range   Unit Number [id number]    Blood Component Type PLTP LR1 PAS    Unit division 00    Status of Unit ALLOCATED    Transfusion  Status      OK TO TRANSFUSE Performed at [facility] [address] [place], [place] [address]  Ct Head Wo Contrast  Result Date: 01/20/2019 CLINICAL DATA:  Status post trip and fall today with a blow to the head and scalp laceration. Initial encounter. EXAM: CT HEAD WITHOUT CONTRAST CT CERVICAL SPINE WITHOUT CONTRAST TECHNIQUE: Multidetector CT imaging of the head and cervical spine was performed following the standard protocol without intravenous contrast. Multiplanar CT image reconstructions of the cervical spine were also generated. COMPARISON:  Head and cervical spine CT scans 09/06/2018. FINDINGS: CT HEAD FINDINGS Brain: No evidence of acute infarction, hemorrhage, hydrocephalus, extra-axial collection or mass lesion/mass effect. Cortical atrophy again seen. Vascular: No hyperdense vessel or unexpected calcification. Skull: Intact.  No focal lesion. Sinuses/Orbits: Negative. Other: Scalp laceration on the right noted. CT CERVICAL SPINE FINDINGS Alignment: Maintained with straightening of lordosis noted. Skull base and vertebrae: No acute fracture. No primary bone lesion or focal pathologic process. Soft tissues and spinal canal: No prevertebral fluid or swelling. No visible canal hematoma. Disc levels: Disc space height is maintained. Facet arthropathy on the left at C4-5 noted. Upper chest: Lung apices clear. Other: None. IMPRESSION: Scalp laceration on the right without underlying fracture. No other acute abnormality head or cervical spine. Cortical atrophy. Electronically Signed   By: Inge Rise M.D.   On: 01/20/2019 18:28   Ct Cervical Spine Wo Contrast  Result Date: 01/20/2019 CLINICAL DATA:  Status post trip and fall today with a blow to the head and scalp laceration. Initial encounter. EXAM: CT HEAD WITHOUT CONTRAST CT CERVICAL SPINE WITHOUT CONTRAST TECHNIQUE: Multidetector CT imaging of the head and cervical spine was performed following the standard protocol without  intravenous contrast. Multiplanar CT image reconstructions of the cervical spine were also generated. COMPARISON:  Head and cervical spine CT scans 09/06/2018. FINDINGS: CT HEAD FINDINGS Brain: No evidence of acute infarction, hemorrhage, hydrocephalus, extra-axial collection or mass lesion/mass effect. Cortical atrophy again seen. Vascular: No hyperdense vessel or unexpected calcification. Skull: Intact.  No focal lesion. Sinuses/Orbits: Negative. Other: Scalp laceration on the right noted. CT CERVICAL SPINE FINDINGS Alignment: Maintained with straightening of lordosis noted. Skull base and vertebrae: No acute fracture. No primary bone lesion or focal pathologic process. Soft tissues and spinal canal: No prevertebral fluid or swelling. No visible canal hematoma. Disc levels: Disc space height is maintained. Facet arthropathy on the left at C4-5 noted. Upper chest: Lung apices clear. Other: None. IMPRESSION: Scalp laceration on the right without underlying fracture. No other acute abnormality head or cervical spine. Cortical atrophy. Electronically Signed   By: Inge Rise M.D.   On: 01/20/2019 18:28    Pending Labs Unresulted Labs (From admission, onward)    Start     Ordered   01/20/19 2103  SARS Coronavirus 2 (CEPHEID - Performed in [facility] hospital lab), Hosp Order  (Asymptomatic Patients Labs)  Once,   R    Question:  Rule Out  Answer:  Yes   01/20/19 2102          Vitals/Pain Today's Vitals   01/20/19 1840 01/20/19 1845 01/20/19 1900 01/20/19 1905  BP: (!) 123/95 104/80 102/73   Pulse: (!) 101 96 97   Resp: 18     Temp: 97.9 F (36.6 C)     TempSrc: Oral     SpO2: 96% 95% 95%   PainSc:    10-Worst pain ever    Isolation Precautions No active isolations  Medications Medications  ondansetron (ZOFRAN) injection 4 mg (4 mg Intravenous Not Given 01/20/19 2148)  lidocaine-EPINEPHrine (XYLOCAINE W/EPI) 2 %-1:200000 (PF) injection  10 mL (has no administration in time range)   0.9 %  sodium chloride infusion (has no administration in time range)  ondansetron (ZOFRAN-ODT) disintegrating tablet 4 mg (4 mg Oral Given 01/20/19 1940)    Mobility walks with person assist High fall risk                 R Recommendations: See Admitting Provider Note  Report given to:   Additional Notes:

## 2019-01-20 NOTE — ED Notes (Signed)
Pt to CT

## 2019-01-20 NOTE — ED Notes (Signed)
BF remains at bedside due to patient altered mental status

## 2019-01-20 NOTE — H&P (Signed)
History and Physical   Briana Sweeney YOV:[id number] DOB: 03-22-1971 DOA: 01/20/2019  Referring MD/NP/PA: Dr. Maryan Rued  PCP: [name], FNP   Outpatient Specialists: None  Patient coming from: Home  Chief Complaint: Nasal bleed and fall  HPI: Briana Sweeney is a 48 y.o. 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  Patient has continued to drink alcohol despite her advanced liver disease.  She apparently has been having epistaxis all week long.  Was seen in the ER having profuse epistaxis tonight.  She was apparently at a friend's house after drinking excessively when she fell and hit her head.  Patient lost some consciousness briefly.  She had initial work-up shows no evidence of intracranial bleed but she was having epistaxis and very low platelets.  She is awake and communicating but a little drowsy.  She is still feeling dizzy and nauseated when she tries to get up.  She is helped frequent transfusions the last was in April.  Patient is being admitted therefore with severe thrombocytopenia, fall and ongoing epistaxis..  ED Course: Temperature is 98 blood pressure 93/58 pulse 102 respiratory rate of 18 oxygen sat 95% room air.  She has a white count of 3.0 hemoglobin is 14.3 platelets 16,000.  Chemistry showed a CO2 of 18 creatinine 0.  6 6 and calcium 8.3.  Glucose 126.  Alcohol level is 436.  CT head without contrast CT cervical spine showed no acute findings.  COVID-19 screen is negative.  Patient is being admitted with severe thrombocytopenia epistaxis and a fall.  Review of Systems: As per HPI otherwise 10 point review of systems negative.    Past Medical History:  Diagnosis Date   Anxiety    Bipolar affective disorder ([place])    With anxiety features   Cirrhosis of liver ([place])    Due to alcohol and hepatitis C   Depression    Esophageal 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 (HCC)    ETOHism (HCC)    GERD (gastroesophageal reflux disease)    Hematemesis 02/10/2018   Hepatitis C 2018   hepatitis c and alcohol related hepatitis   Heroin abuse ([place])    History of blood transfusion    "blood doesn't clot; I fell down and had to have a transfusion"   History of kidney stones    Migraine    "when I get really stressed" (09/01/2017)   Schizophrenia ([place])    Seizures ([place])    "when I run out of my RX; lots recently" (09/01/2017)    Past Surgical History:  Procedure Laterality Date   ESOPHAGOGASTRODUODENOSCOPY N/A 09/03/2017   Procedure: ESOPHAGOGASTRODUODENOSCOPY (EGD);  Surgeon: Doran Stabler, MD;  Location: [facility];  Service: Gastroenterology;  Laterality: N/A;   FINGER FRACTURE SURGERY Left    "shattered my pinky"   FRACTURE SURGERY     I&D EXTREMITY Left 09/18/2018   Procedure: IRRIGATION AND DEBRIDEMENT EXTREMITY;  Surgeon: Leanora Cover, MD;  Location: [facility];  Service: Orthopedics;  Laterality: Left;   IR PARACENTESIS  07/23/2017   IR PARACENTESIS  07/2017   "did it twice in the same week" (09/01/2017)   SHOULDER OPEN ROTATOR CUFF REPAIR Right    TUBAL LIGATION     VAGINAL HYSTERECTOMY       reports that she has never smoked. She has never used smokeless tobacco. She reports current alcohol  use of about 63.0 standard drinks of alcohol per week. She reports current drug use. Drugs: Marijuana and Cocaine.  No Known Allergies  Family History  Problem Relation Age of Onset   Lung cancer Mother 70   Alcohol abuse Mother    Throat cancer Father 21     Prior to Admission medications   Medication Sig Start Date End Date Taking? Authorizing Provider  famotidine (PEPCID) 20 MG tablet Take 1 tablet (20 mg total) by mouth daily. 09/23/18  Yes Eugenie Filler, MD  FLUoxetine (PROZAC) 40 MG capsule Take 1 capsule (40 mg total) by mouth  daily. 12/23/18  Yes [name], FNP  folic acid (FOLVITE) 1 MG tablet Take 1 tablet (1 mg total) by mouth daily. 12/23/18  Yes [name], FNP  gabapentin (NEURONTIN) 300 MG capsule Take 2 capsules 2 times a day. Patient taking differently: 600 mg 2 (two) times daily.  12/23/18  Yes [name], FNP  haloperidol (HALDOL) 2 MG tablet Take 1 tablet (2 mg total) by mouth at bedtime. 12/23/18  Yes [name], FNP  hydrOXYzine (ATARAX/VISTARIL) 25 MG tablet Take 1 tablet (25 mg total) by mouth every 6 (six) hours as needed for itching. Patient taking differently: Take 25 mg by mouth every 6 (six) hours as needed for anxiety.  10/07/18  Yes [name], FNP  ibuprofen (ADVIL) 200 MG tablet Take 400 mg by mouth every 6 (six) hours as needed for headache or mild pain.    Yes [provider]  lactulose (CHRONULAC) 10 GM/15ML solution Take 30 mLs (20 g total) by mouth 3 (three) times daily. Goal to have 2-3 BMs daily 12/14/18  Yes Isabelle Course, MD  Multiple Vitamin (MULTIVITAMIN WITH MINERALS) TABS tablet Take 1 tablet by mouth daily. 08/26/18  Yes Hongalgi, Lenis Dickinson, MD  naltrexone (DEPADE) 50 MG tablet Take 1 tablet (50 mg total) by mouth daily. 12/14/18  Yes Isabelle Course, MD  phenytoin (DILANTIN) 300 MG ER capsule Take 1 capsule (300 mg total) by mouth daily. 12/23/18  Yes [name], FNP  thiamine 100 MG tablet Take 1 tablet (100 mg total) by mouth daily. 12/23/18  Yes [name], FNP  topiramate (TOPAMAX) 50 MG tablet Take 1 tablet (50 mg total) by mouth daily. 10/20/18  Yes [name], FNP  traZODone (DESYREL) 50 MG tablet Take 2 tablets (100 mg total) by mouth at bedtime. 12/23/18  Yes [name], FNP    Physical Exam: Vitals:   01/20/19 1845 01/20/19 1900 01/20/19 2240 01/20/19 2321  BP: 104/80 102/73 (!) 93/58 113/84  Pulse: 96 97 78 86  Resp:    16  Temp:    98 F (36.7 C)  TempSrc:    Oral  SpO2: 95% 95% 98% 99%  Weight:    57.3  kg      Constitutional: Drowsy, ongoing epistaxis, no distress Vitals:   01/20/19 1845 01/20/19 1900 01/20/19 2240 01/20/19 2321  BP: 104/80 102/73 (!) 93/58 113/84  Pulse: 96 97 78 86  Resp:    16  Temp:    98 F (36.7 C)  TempSrc:    Oral  SpO2: 95% 95% 98% 99%  Weight:    57.3 kg   Eyes: PERRL, lids and conjunctivae normal ENMT: Patient has epistaxis from both nostrils, episodic, mucous membranes are moist. Posterior pharynx clear of any exudate or lesions.Normal dentition.  Neck: normal, supple, no masses, no thyromegaly Respiratory: clear  to auscultation bilaterally, no wheezing, no crackles. Normal respiratory effort. No accessory muscle use.  Cardiovascular: Tachycardia, no murmurs / rubs / gallops. No extremity edema. 2+ pedal pulses. No carotid bruits.  Abdomen: no tenderness, no masses palpated. No hepatosplenomegaly.  No demonstrable ascites, bowel sounds positive.  Musculoskeletal: no clubbing / cyanosis. No joint deformity upper and lower extremities. Good ROM, no contractures. Normal muscle tone.  Skin: no rashes, lesions, ulcers. No induration Neurologic: CN 2-12 grossly intact. Sensation intact, DTR normal. Strength 5/5 in all 4.  Psychiatric: Slightly drowsy but arousable, communicates, normal mood.     Labs on Admission: I have personally reviewed following labs and imaging studies  CBC: Recent Labs  Lab 01/20/19 1942  WBC 3.0*  NEUTROABS 1.0*  HGB 14.3  HCT 42.8  MCV 94.1  PLT 16*   Basic Metabolic Panel: Recent Labs  Lab 01/20/19 1942  NA 141  K 3.8  CL 107  CO2 18*  GLUCOSE 126*  BUN 6  CREATININE 0.66  CALCIUM 8.3*   GFR: Estimated Creatinine Clearance: 69.6 mL/min (by C-G formula based on SCr of 0.66 mg/dL). Liver Function Tests: No results for input(s): AST, ALT, ALKPHOS, BILITOT, PROT, ALBUMIN in the last 168 hours. No results for input(s): LIPASE, AMYLASE in the last 168 hours. No results for input(s): AMMONIA in the last 168  hours. Coagulation Profile: No results for input(s): INR, PROTIME in the last 168 hours. Cardiac Enzymes: No results for input(s): CKTOTAL, CKMB, CKMBINDEX, TROPONINI in the last 168 hours. BNP (last 3 results) No results for input(s): PROBNP in the last 8760 hours. HbA1C: No results for input(s): HGBA1C in the last 72 hours. CBG: No results for input(s): GLUCAP in the last 168 hours. Lipid Profile: No results for input(s): CHOL, HDL, LDLCALC, TRIG, CHOLHDL, LDLDIRECT in the last 72 hours. Thyroid Function Tests: No results for input(s): TSH, T4TOTAL, FREET4, T3FREE, THYROIDAB in the last 72 hours. Anemia Panel: No results for input(s): VITAMINB12, FOLATE, FERRITIN, TIBC, IRON, RETICCTPCT in the last 72 hours. Urine analysis:    Component Value Date/Time   COLORURINE YELLOW 12/05/2018 1635   APPEARANCEUR CLEAR 12/05/2018 1635   LABSPEC 1.003 (L) 12/05/2018 1635   PHURINE 6.0 12/05/2018 1635   GLUCOSEU NEGATIVE 12/05/2018 1635   HGBUR NEGATIVE 12/05/2018 1635   BILIRUBINUR NEGATIVE 12/05/2018 1635   BILIRUBINUR negative 10/23/2018 1302   BILIRUBINUR neg 02/20/2018 1248   KETONESUR NEGATIVE 12/05/2018 1635   PROTEINUR NEGATIVE 12/05/2018 1635   UROBILINOGEN 1.0 10/23/2018 1302   UROBILINOGEN 0.2 12/02/2017 0836   NITRITE NEGATIVE 12/05/2018 1635   LEUKOCYTESUR NEGATIVE 12/05/2018 1635   Sepsis Labs: @LABRCNTIP (procalcitonin:4,lacticidven:4) ) Recent Results (from the past 240 hour(s))  SARS Coronavirus 2 (CEPHEID - Performed in [place] hospital lab), Hosp Order     Status: None   Collection Time: 01/20/19 10:44 PM  Result Value Ref Range Status   SARS Coronavirus 2 NEGATIVE NEGATIVE Final    Comment: (NOTE) If result is NEGATIVE SARS-CoV-2 target nucleic acids are NOT DETECTED. The SARS-CoV-2 RNA is generally detectable in upper and lower  respiratory specimens during the acute phase of infection. The lowest  concentration of SARS-CoV-2 viral copies this assay can  detect is 250  copies / mL. A negative result does not preclude SARS-CoV-2 infection  and should not be used as the sole basis for treatment or other  patient management decisions.  A negative result may occur with  improper specimen collection / handling, submission of specimen other  than  nasopharyngeal swab, presence of viral mutation(s) within the  areas targeted by this assay, and inadequate number of viral copies  (<250 copies / mL). A negative result must be combined with clinical  observations, patient history, and epidemiological information. If result is POSITIVE SARS-CoV-2 target nucleic acids are DETECTED. The SARS-CoV-2 RNA is generally detectable in upper and lower  respiratory specimens dur ing the acute phase of infection.  Positive  results are indicative of active infection with SARS-CoV-2.  Clinical  correlation with patient history and other diagnostic information is  necessary to determine patient infection status.  Positive results do  not rule out bacterial infection or co-infection with other viruses. If result is PRESUMPTIVE POSTIVE SARS-CoV-2 nucleic acids MAY BE PRESENT.   A presumptive positive result was obtained on the submitted specimen  and confirmed on repeat testing.  While 2019 novel coronavirus  (SARS-CoV-2) nucleic acids may be present in the submitted sample  additional confirmatory testing may be necessary for epidemiological  and / or clinical management purposes  to differentiate between  SARS-CoV-2 and other Sarbecovirus currently known to infect humans.  If clinically indicated additional testing with an alternate test  methodology [phone]) is advised. The SARS-CoV-2 RNA is generally  detectable in upper and lower respiratory sp ecimens during the acute  phase of infection. The expected result is Negative. Fact Sheet for Patients:  StrictlyIdeas.no Fact Sheet for Healthcare  Providers: BankingDealers.co.za This test is not yet approved or cleared by the [place] FDA and has been authorized for detection and/or diagnosis of SARS-CoV-2 by FDA under an Emergency Use Authorization (EUA).  This EUA will remain in effect (meaning this test can be used) for the duration of the COVID-19 declaration under Section 564(b)(1) of the Act, 21 U.S.C. section 360bbb-3(b)(1), unless the authorization is terminated or revoked sooner. Performed at [facility], [place] [address], [place], [place] [address]      Radiological Exams on Admission: Ct Head Wo Contrast  Result Date: 01/20/2019 CLINICAL DATA:  Status post trip and fall today with a blow to the head and scalp laceration. Initial encounter. EXAM: CT HEAD WITHOUT CONTRAST CT CERVICAL SPINE WITHOUT CONTRAST TECHNIQUE: Multidetector CT imaging of the head and cervical spine was performed following the standard protocol without intravenous contrast. Multiplanar CT image reconstructions of the cervical spine were also generated. COMPARISON:  Head and cervical spine CT scans 09/06/2018. FINDINGS: CT HEAD FINDINGS Brain: No evidence of acute infarction, hemorrhage, hydrocephalus, extra-axial collection or mass lesion/mass effect. Cortical atrophy again seen. Vascular: No hyperdense vessel or unexpected calcification. Skull: Intact.  No focal lesion. Sinuses/Orbits: Negative. Other: Scalp laceration on the right noted. CT CERVICAL SPINE FINDINGS Alignment: Maintained with straightening of lordosis noted. Skull base and vertebrae: No acute fracture. No primary bone lesion or focal pathologic process. Soft tissues and spinal canal: No prevertebral fluid or swelling. No visible canal hematoma. Disc levels: Disc space height is maintained. Facet arthropathy on the left at C4-5 noted. Upper chest: Lung apices clear. Other: None. IMPRESSION: Scalp laceration on the right without underlying fracture. No other acute  abnormality head or cervical spine. Cortical atrophy. Electronically Signed   By: Inge Rise M.D.   On: 01/20/2019 18:28   Ct Cervical Spine Wo Contrast  Result Date: 01/20/2019 CLINICAL DATA:  Status post trip and fall today with a blow to the head and scalp laceration. Initial encounter. EXAM: CT HEAD WITHOUT CONTRAST CT CERVICAL SPINE WITHOUT CONTRAST TECHNIQUE: Multidetector CT imaging of the head  and cervical spine was performed following the standard protocol without intravenous contrast. Multiplanar CT image reconstructions of the cervical spine were also generated. COMPARISON:  Head and cervical spine CT scans 09/06/2018. FINDINGS: CT HEAD FINDINGS Brain: No evidence of acute infarction, hemorrhage, hydrocephalus, extra-axial collection or mass lesion/mass effect. Cortical atrophy again seen. Vascular: No hyperdense vessel or unexpected calcification. Skull: Intact.  No focal lesion. Sinuses/Orbits: Negative. Other: Scalp laceration on the right noted. CT CERVICAL SPINE FINDINGS Alignment: Maintained with straightening of lordosis noted. Skull base and vertebrae: No acute fracture. No primary bone lesion or focal pathologic process. Soft tissues and spinal canal: No prevertebral fluid or swelling. No visible canal hematoma. Disc levels: Disc space height is maintained. Facet arthropathy on the left at C4-5 noted. Upper chest: Lung apices clear. Other: None. IMPRESSION: Scalp laceration on the right without underlying fracture. No other acute abnormality head or cervical spine. Cortical atrophy. Electronically Signed   By: Inge Rise M.D.   On: 01/20/2019 18:28    Assessment/Plan Principal Problem:   Fall Active Problems:   Decompensated hepatic cirrhosis (HCC)   Alcohol abuse   Hepatitis C antibody positive in blood   Schizophrenia (HCC)   GERD (gastroesophageal reflux disease)   Thrombocytopenia (HCC)   Polysubstance abuse (H[facility])   Epistaxis     #1 status post fall: Probably  secondary to alcohol intoxication and gait abnormalities.  Patient hit her head and is at risk for intracranial bleed due to her low platelets.  Admit the patient and monitor her mental status closely.  May repeat CT scan next 24 to 48 hours to look for possible slow epidural bleed.  If her mental status improves however she probably would be all right.  In the meantime PT may be consulted prior to discharge.  Counseling about alcoholism.  #2 ongoing epistaxis: May be related to the low platelets.  Patient is getting platelet transfusion.  Epistaxis is episodic.  Monitor closely.  Hemoglobin is stable.  #3 thrombocytopenia: Platelets of 16,000.  Will transfuse platelets and monitor.  #4 leukopenia: Secondary to alcoholic cirrhosis.  Also hepatitis C contributing.  Patient is not neutropenic at the moment.  Continue to monitor  #5 alcohol abuse and intoxication: Initiate CIWA protocol.  Monitor patient closely.  #6 alcoholic cirrhosis: Patient has no significant ascites.  She is on lactulose at home and will continue with that.  #7 GERD: Continue with PPIs.    DVT prophylaxis: SCD Code Status: Full code Family Communication: No family at bedside Disposition Plan: Home Consults called: None Admission status: Inpatient  Severity of Illness: The appropriate patient status for this patient is INPATIENT. Inpatient status is judged to be reasonable and necessary in order to provide the required intensity of service to ensure the patient's safety. The patient's presenting symptoms, physical exam findings, and initial radiographic and laboratory data in the context of their chronic comorbidities is felt to place them at high risk for further clinical deterioration. Furthermore, it is not anticipated that the patient will be medically stable for discharge from the hospital within 2 midnights of admission. The following factors support the patient status of inpatient.   " The patient's presenting  symptoms include fall. " The worrisome physical exam findings include ongoing epistaxis. " The initial radiographic and laboratory data are worrisome because of platelets of 16,000. " The chronic co-morbidities include polysubstance abuse with cirrhosis.   * I certify that at the point of admission it is my clinical judgment that the patient  will require inpatient hospital care spanning beyond 2 midnights from the point of admission due to high intensity of service, high risk for further deterioration and high frequency of surveillance required.Barbette Merino MD [facility] Pager [phone]  If 7PM-7AM, please contact night-coverage [email or web address] Password [facility]  01/20/2019, 11:52 PM

## 2019-01-20 NOTE — ED Provider Notes (Signed)
[facility] EMERGENCY DEPARTMENT Provider Note   CSN: [id number] Arrival date & time: 01/20/19  1719    History   Chief Complaint Chief Complaint  Patient presents with  . Head Injury    HPI Briana Sweeney is a 48 y.o. female.     Patient is a 48 year old female with history of polysubstance abuse, hepatitis with cirrhosis and resulting thrombocytopenia requiring frequent platelet transfusions who continues to abuse alcohol presenting today after falling at a friend's house and hitting her head with positive loss of consciousness.  Patient's significant other is present in the room and states she has been drinking today and she lost her balance and fell.  Patient is complaining of being dizzy and feeling nauseated.  She is also complaining of a headache.  She denies any chest pain, shortness of breath or pain in her legs.  She states her last platelet transfusion was in April and she was supposed to have another one but because of COVID it was canceled.  She admits to not taking any of her meds for the last few days.  The history is provided by the patient and a significant other.  Head Injury  Location:  R parietal Mechanism of injury: fall   Fall:    Fall occurred: lost her balance and fell hitting her head.   Impact surface:  Hard floor   Point of impact:  Head   Entrapped after fall: no   Pain details:    Quality:  Aching and pressure   Severity:  Moderate   Timing:  Constant Chronicity:  New Associated symptoms: loss of consciousness and nausea   Associated symptoms comment:  Feels dizzy   Past Medical History:  Diagnosis Date  . Anxiety   . Bipolar affective disorder ([place])    With anxiety features  . Cirrhosis of liver ([place])    Due to alcohol and hepatitis C  . Depression   . 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 ETOHism ([place])   . GERD (gastroesophageal reflux disease)   . Hematemesis 02/10/2018  . Hepatitis C 2018   hepatitis c and  alcohol related hepatitis  . Heroin abuse ([place])   . History of blood transfusion    "blood doesn't clot; I fell down and had to have a transfusion"  . History of kidney stones   . Migraine    "when I get really stressed" (09/01/2017)  . Schizophrenia ([place])   . Seizures ([place])    "when I run out of my RX; lots recently" (09/01/2017)    Patient Active Problem List   Diagnosis Date Noted  . Altered mental status   . 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 Anxiety 10/27/2018  . Neuropathy 10/27/2018  . Abscess of bursa of left elbow 09/23/2018  . Olecranon bursitis of left elbow   . Erysipelas 09/13/2018  . Acute metabolic encephalopathy 08/65/7846  . Overdose 08/22/2018  . Hypotension 08/22/2018  . Seizure ([place]) 08/22/2018  . Substance induced mood disorder ([place]) 07/17/2018  . Cocaine dependence without complication ([place]) 96/29/5284  . Polysubstance abuse ([place]) 04/08/2018  . 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 Hepatitis C 03/18/2018  . Portal hypertension ([place]) 03/18/2018  . Alcohol abuse with alcohol-induced disorder ([place]) 03/18/2018  . Depression 03/18/2018  . Upper GI bleed 02/10/2018  . Alcohol withdrawal, with unspecified complication ([place]) 13/24/4010  . Chronic anemia   . Thrombocytopenia due to drugs   . Suicidal ideation   . Thrombocytopenia ([place]) 01/02/2018  .  GERD (gastroesophageal reflux disease) 11/01/2017  . Trichimoniasis 10/30/2017  . Alcohol dependence ([place]) 10/29/2017  . MDD (major depressive disorder), severe ([place]) 10/28/2017  . Acute hyperactive alcohol withdrawal delirium ([place]) 10/09/2017  . Schizophrenia ([place]) 10/09/2017  . Alcohol abuse with alcohol-induced mood disorder ([place])   . Alcohol withdrawal syndrome with complication ([place]) 54/62/7035  . Esophageal varices without bleeding ([place])   . Hematemesis 09/01/2017  . Ascites due to alcoholic cirrhosis ([place])   . Decompensated hepatic cirrhosis ([place]) 07/23/2017  . Alcohol abuse  07/23/2017  . Hepatitis C antibody positive in blood 07/23/2017  . Hypokalemia 07/23/2017  . Jaundice 07/23/2017  . Coagulopathy ([place]) 07/23/2017  . Hypomagnesemia 07/23/2017  . Pancytopenia ([place]) 07/23/2017  . Alcoholic cirrhosis of liver with ascites ([place]) 07/23/2017  . Bacterial vaginosis 06/03/2017  . UTI (urinary tract infection) 06/02/2017    Past Surgical History:  Procedure Laterality Date  . ESOPHAGOGASTRODUODENOSCOPY N/A 09/03/2017   Procedure: ESOPHAGOGASTRODUODENOSCOPY (EGD);  Surgeon: Doran Stabler, MD;  Location: [facility];  Service: Gastroenterology;  Laterality: N/A;  . FINGER FRACTURE SURGERY Left    "shattered my pinky"  . FRACTURE SURGERY    . I&D EXTREMITY Left 09/18/2018   Procedure: IRRIGATION AND DEBRIDEMENT EXTREMITY;  Surgeon: Leanora Cover, MD;  Location: [facility];  Service: Orthopedics;  Laterality: Left;  . IR PARACENTESIS  07/23/2017  . IR PARACENTESIS  07/2017   "did it twice in the same week" (09/01/2017)  . SHOULDER OPEN ROTATOR CUFF REPAIR Right   . TUBAL LIGATION    . VAGINAL HYSTERECTOMY       OB History   No obstetric history on file.      Home Medications    Prior to Admission medications   Medication Sig Start Date End Date Taking? Authorizing Provider  famotidine (PEPCID) 20 MG tablet Take 1 tablet (20 mg total) by mouth daily. 09/23/18  Yes Eugenie Filler, MD  FLUoxetine (PROZAC) 40 MG capsule Take 1 capsule (40 mg total) by mouth daily. 12/23/18  Yes [name], FNP  folic acid (FOLVITE) 1 MG tablet Take 1 tablet (1 mg total) by mouth daily. 12/23/18  Yes [name], FNP  gabapentin (NEURONTIN) 300 MG capsule Take 2 capsules 2 times a day. Patient taking differently: 600 mg 2 (two) times daily.  12/23/18  Yes [name], FNP  haloperidol (HALDOL) 2 MG tablet Take 1 tablet (2 mg total) by mouth at bedtime. 12/23/18  Yes [name], FNP  hydrOXYzine (ATARAX/VISTARIL) 25 MG tablet Take 1 tablet (25 mg  total) by mouth every 6 (six) hours as needed for itching. Patient taking differently: Take 25 mg by mouth every 6 (six) hours as needed for anxiety.  10/07/18  Yes [name], FNP  ibuprofen (ADVIL) 200 MG tablet Take 400 mg by mouth every 6 (six) hours as needed for headache or mild pain.    Yes [provider]  lactulose (CHRONULAC) 10 GM/15ML solution Take 30 mLs (20 g total) by mouth 3 (three) times daily. Goal to have 2-3 BMs daily 12/14/18  Yes Isabelle Course, MD  Multiple Vitamin (MULTIVITAMIN WITH MINERALS) TABS tablet Take 1 tablet by mouth daily. 08/26/18  Yes Hongalgi, Lenis Dickinson, MD  naltrexone (DEPADE) 50 MG tablet Take 1 tablet (50 mg total) by mouth daily. 12/14/18  Yes Isabelle Course, MD  phenytoin (DILANTIN) 300 MG ER capsule Take 1 capsule (300 mg total) by mouth daily. 12/23/18  Yes [name],  FNP  thiamine 100 MG tablet Take 1 tablet (100 mg total) by mouth daily. 12/23/18  Yes [name], FNP  topiramate (TOPAMAX) 50 MG tablet Take 1 tablet (50 mg total) by mouth daily. 10/20/18  Yes [name], FNP  traZODone (DESYREL) 50 MG tablet Take 2 tablets (100 mg total) by mouth at bedtime. 12/23/18  Yes [name], FNP    Family History Family History  Problem Relation Age of Onset  . Lung cancer Mother 54  . Alcohol abuse Mother   . Throat cancer Father 6    Social History Social History   Tobacco Use  . Smoking status: Never Smoker  . Smokeless tobacco: Never Used  Substance Use Topics  . Alcohol use: Yes    Alcohol/week: 63.0 standard drinks    Types: 63 Cans of beer per week    Comment: weekly "I have cut back"  . Drug use: Yes    Types: Marijuana, Cocaine     Allergies   Patient has no known allergies.   Review of Systems Review of Systems  Gastrointestinal: Positive for nausea.  Neurological: Positive for loss of consciousness.  All other systems reviewed and are negative.    Physical Exam Updated Vital Signs BP  102/73   Pulse 97   Temp 97.9 F (36.6 C) (Oral)   Resp 18   SpO2 95%   Physical Exam Vitals signs and nursing note reviewed.  Constitutional:      General: She is not in acute distress.    Appearance: She is well-developed.  HENT:     Head: Normocephalic. Contusion and laceration present.      Mouth/Throat:     Mouth: Mucous membranes are dry.  Eyes:     Pupils: Pupils are equal, round, and reactive to light.  Cardiovascular:     Rate and Rhythm: Normal rate and regular rhythm.     Heart sounds: Normal heart sounds. No murmur. No friction rub.  Pulmonary:     Effort: Pulmonary effort is normal.     Breath sounds: Normal breath sounds. No wheezing or rales.  Abdominal:     General: Bowel sounds are normal. There is no distension.     Palpations: Abdomen is soft.     Tenderness: There is no abdominal tenderness. There is no guarding or rebound.  Musculoskeletal: Normal range of motion.        General: No tenderness.     Comments: Hematoma present on the right forearm.  Able to move legs without pain or difficulty  Skin:    General: Skin is warm and dry.     Findings: No rash.  Neurological:     Mental Status: She is alert and oriented to person, place, and time. Mental status is at baseline.     Cranial Nerves: No cranial nerve deficit.     Comments: No nystagmus  Psychiatric:     Comments: Calm and cooperative      ED Treatments / Results  Labs (all labs ordered are listed, but only abnormal results are displayed) Labs Reviewed  CBC WITH DIFFERENTIAL/PLATELET - Abnormal; Notable for the following components:      Result Value   WBC 3.0 (*)    RDW 17.6 (*)    Platelets 16 (*)    Neutro Abs 1.0 (*)    All other components within normal limits  BASIC METABOLIC PANEL - Abnormal; Notable for the following components:   CO2 18 (*)  Glucose, Bld 126 (*)    Calcium 8.3 (*)    Anion gap 16 (*)    All other components within normal limits  ETHANOL - Abnormal;  Notable for the following components:   Alcohol, Ethyl (B) 436 (*)    All other components within normal limits  PREPARE PLATELET PHERESIS    EKG None  Radiology Ct Head Wo Contrast  Result Date: 01/20/2019 CLINICAL DATA:  Status post trip and fall today with a blow to the head and scalp laceration. Initial encounter. EXAM: CT HEAD WITHOUT CONTRAST CT CERVICAL SPINE WITHOUT CONTRAST TECHNIQUE: Multidetector CT imaging of the head and cervical spine was performed following the standard protocol without intravenous contrast. Multiplanar CT image reconstructions of the cervical spine were also generated. COMPARISON:  Head and cervical spine CT scans 09/06/2018. FINDINGS: CT HEAD FINDINGS Brain: No evidence of acute infarction, hemorrhage, hydrocephalus, extra-axial collection or mass lesion/mass effect. Cortical atrophy again seen. Vascular: No hyperdense vessel or unexpected calcification. Skull: Intact.  No focal lesion. Sinuses/Orbits: Negative. Other: Scalp laceration on the right noted. CT CERVICAL SPINE FINDINGS Alignment: Maintained with straightening of lordosis noted. Skull base and vertebrae: No acute fracture. No primary bone lesion or focal pathologic process. Soft tissues and spinal canal: No prevertebral fluid or swelling. No visible canal hematoma. Disc levels: Disc space height is maintained. Facet arthropathy on the left at C4-5 noted. Upper chest: Lung apices clear. Other: None. IMPRESSION: Scalp laceration on the right without underlying fracture. No other acute abnormality head or cervical spine. Cortical atrophy. Electronically Signed   By: Inge Rise M.D.   On: 01/20/2019 18:28   Ct Cervical Spine Wo Contrast  Result Date: 01/20/2019 CLINICAL DATA:  Status post trip and fall today with a blow to the head and scalp laceration. Initial encounter. EXAM: CT HEAD WITHOUT CONTRAST CT CERVICAL SPINE WITHOUT CONTRAST TECHNIQUE: Multidetector CT imaging of the head and cervical spine  was performed following the standard protocol without intravenous contrast. Multiplanar CT image reconstructions of the cervical spine were also generated. COMPARISON:  Head and cervical spine CT scans 09/06/2018. FINDINGS: CT HEAD FINDINGS Brain: No evidence of acute infarction, hemorrhage, hydrocephalus, extra-axial collection or mass lesion/mass effect. Cortical atrophy again seen. Vascular: No hyperdense vessel or unexpected calcification. Skull: Intact.  No focal lesion. Sinuses/Orbits: Negative. Other: Scalp laceration on the right noted. CT CERVICAL SPINE FINDINGS Alignment: Maintained with straightening of lordosis noted. Skull base and vertebrae: No acute fracture. No primary bone lesion or focal pathologic process. Soft tissues and spinal canal: No prevertebral fluid or swelling. No visible canal hematoma. Disc levels: Disc space height is maintained. Facet arthropathy on the left at C4-5 noted. Upper chest: Lung apices clear. Other: None. IMPRESSION: Scalp laceration on the right without underlying fracture. No other acute abnormality head or cervical spine. Cortical atrophy. Electronically Signed   By: Inge Rise M.D.   On: 01/20/2019 18:28    Procedures Procedures (including critical care time) LACERATION REPAIR Performed by: [employer] Authorized by: Blanchie Dessert Consent: Verbal consent obtained. Risks and benefits: risks, benefits and alternatives were discussed Consent given by: patient Patient identity confirmed: provided demographic data Prepped and Draped in normal sterile fashion Wound explored  Laceration Location: right scalp  Laceration Length: 2cm  No Foreign Bodies seen or palpated  Anesthesia: local infiltration  Local anesthetic: lidocaine 2% with epinephrine  Anesthetic total: 5 ml  Irrigation method: syringe Amount of cleaning: standard  Skin closure: 4.0 vicryl rapide  Number of sutures:  2  Technique: simple interrupted  Patient  tolerance: Patient tolerated the procedure well with no immediate complications.   Medications Ordered in ED Medications  ondansetron (ZOFRAN) injection 4 mg (has no administration in time range)  lidocaine-EPINEPHrine (XYLOCAINE W/EPI) 2 %-1:200000 (PF) injection 10 mL (has no administration in time range)  0.9 %  sodium chloride infusion (has no administration in time range)  ondansetron (ZOFRAN-ODT) disintegrating tablet 4 mg (4 mg Oral Given 01/20/19 1940)     Initial Impression / Assessment and Plan / ED Course  I have reviewed the triage vital signs and the nursing notes.  Pertinent labs & imaging results that were available during my care of the patient were reviewed by me and considered in my medical decision making (see chart for details).       Patient presenting today after a fall where she hit her head.  Fall is attributed to being intoxicated as her alcohol level was 400.  Patient does have a history of chronic thrombocytopenia requiring recurrent platelet transfusions.  Patient's hemoglobin is normal today however her platelet count is 16.  Given the injury to her head, low platelet count, intoxication and ongoing complaints of nausea and dizziness will admit patient for platelet transfusion, observation to ensure her symptoms do not worsen and that she does not need a repeat head CT.  Her initial head and cervical CTs are negative for acute injury or bleed.  Patient's wound was repaired as above.  Tetanus shot was updated.  CRITICAL CARE Performed by: Joyceline Maiorino Total critical care time: 30 minutes Critical care time was exclusive of separately billable procedures and treating other patients. Critical care was necessary to treat or prevent imminent or life-threatening deterioration. Critical care was time spent personally by me on the following activities: development of treatment plan with patient and/or surrogate as well as nursing, discussions with consultants,  evaluation of patient's response to treatment, examination of patient, obtaining history from patient or surrogate, ordering and performing treatments and interventions, ordering and review of laboratory studies, ordering and review of radiographic studies, pulse oximetry and re-evaluation of patient's condition.   Final Clinical Impressions(s) / ED Diagnoses   Final diagnoses:  Injury of head, initial encounter  Scalp laceration, initial encounter  Thrombocytopenia ([place])  Alcoholic intoxication without complication [facility])    ED Discharge Orders    None       Blanchie Dessert, MD 01/20/19 2108

## 2019-01-21 ENCOUNTER — Inpatient Hospital Stay (HOSPITAL_COMMUNITY): Payer: Medicaid Other

## 2019-01-21 DIAGNOSIS — S0101XA Laceration without foreign body of scalp, initial encounter: Secondary | ICD-10-CM

## 2019-01-21 DIAGNOSIS — R768 Other specified abnormal immunological findings in serum: Secondary | ICD-10-CM

## 2019-01-21 DIAGNOSIS — K7290 Hepatic failure, unspecified without coma: Secondary | ICD-10-CM

## 2019-01-21 DIAGNOSIS — S0990XA Unspecified injury of head, initial encounter: Secondary | ICD-10-CM

## 2019-01-21 DIAGNOSIS — F10920 Alcohol use, unspecified with intoxication, uncomplicated: Secondary | ICD-10-CM

## 2019-01-21 LAB — COMPREHENSIVE METABOLIC PANEL
ALT: 31 U/L (ref 0–44)
AST: 91 U/L — ABNORMAL HIGH (ref 15–41)
Albumin: 3.2 g/dL — ABNORMAL LOW (ref 3.5–5.0)
Alkaline Phosphatase: 174 U/L — ABNORMAL HIGH (ref 38–126)
Anion gap: 15 (ref 5–15)
BUN: 5 mg/dL — ABNORMAL LOW (ref 6–20)
CO2: 21 mmol/L — ABNORMAL LOW (ref 22–32)
Calcium: 7.8 mg/dL — ABNORMAL LOW (ref 8.9–10.3)
Chloride: 108 mmol/L (ref 98–111)
Creatinine, Ser: 0.59 mg/dL (ref 0.44–1.00)
GFR calc Af Amer: >60 mL/min (ref >60)
GFR calc non Af Amer: >60 mL/min (ref >60)
Glucose, Bld: 90 mg/dL (ref 70–99)
Potassium: 3.9 mmol/L (ref 3.5–5.1)
Sodium: 144 mmol/L (ref 135–145)
Total Bilirubin: 0.9 mg/dL (ref 0.3–1.2)
Total Protein: 7.9 g/dL (ref 6.5–8.1)

## 2019-01-21 LAB — RAPID URINE DRUG SCREEN, HOSP PERFORMED
Amphetamines: NOT DETECTED
Barbiturates: NOT DETECTED
Benzodiazepines: POSITIVE — AB
Cocaine: NOT DETECTED
Opiates: NOT DETECTED
Tetrahydrocannabinol: NOT DETECTED

## 2019-01-21 LAB — CBC
HCT: 38.7 % (ref 36.0–46.0)
Hemoglobin: 12.9 g/dL (ref 12.0–15.0)
MCH: 31.2 pg (ref 26.0–34.0)
MCHC: 33.3 g/dL (ref 30.0–36.0)
MCV: 93.7 fL (ref 80.0–100.0)
Platelets: 30 10*3/uL — ABNORMAL LOW (ref 150–400)
RBC: 4.13 MIL/uL (ref 3.87–5.11)
RDW: 17.6 % — ABNORMAL HIGH (ref 11.5–15.5)
WBC: 5.4 10*3/uL (ref 4.0–10.5)
nRBC: 0 % (ref 0.0–0.2)

## 2019-01-21 LAB — URINALYSIS, ROUTINE W REFLEX MICROSCOPIC
Bacteria, UA: NONE SEEN
Bilirubin Urine: NEGATIVE
Glucose, UA: NEGATIVE mg/dL
Ketones, ur: NEGATIVE mg/dL
Leukocytes,Ua: NEGATIVE
Nitrite: NEGATIVE
Protein, ur: NEGATIVE mg/dL
Specific Gravity, Urine: 1.003 — ABNORMAL LOW (ref 1.005–1.030)
pH: 8 (ref 5.0–8.0)

## 2019-01-21 MED ORDER — LACTATED RINGERS IV SOLN
INTRAVENOUS | Status: DC
  Administered 2019-01-21 (×2): via INTRAVENOUS

## 2019-01-21 MED ORDER — POLYETHYLENE GLYCOL 3350 17 G PO PACK
17.0000 g | PACK | Freq: Every day | ORAL | Status: DC
  Administered 2019-01-21 – 2019-01-25 (×3): 17 g via ORAL
  Filled 2019-01-21 (×6): qty 1

## 2019-01-21 MED ORDER — SENNOSIDES-DOCUSATE SODIUM 8.6-50 MG PO TABS
1.0000 | ORAL_TABLET | Freq: Two times a day (BID) | ORAL | Status: DC
  Administered 2019-01-21 – 2019-01-26 (×9): 1 via ORAL
  Filled 2019-01-21 (×10): qty 1

## 2019-01-21 MED ORDER — BISACODYL 10 MG RE SUPP
10.0000 mg | Freq: Every day | RECTAL | Status: DC
  Administered 2019-01-21: 10 mg via RECTAL
  Filled 2019-01-21 (×2): qty 1

## 2019-01-21 NOTE — Progress Notes (Signed)
Pt platelet infusion completed with no reaction noted or voiced. Will continue to closely monitor Pt. Delia Heady RN

## 2019-01-21 NOTE — TOC Initial Note (Signed)
Transition of Care [facility]) - Initial/Assessment Note    Patient Details  Name: Briana Sweeney MRN: [id number] Date of Birth: Nov 03, 1970  Transition of Care [facility]) CM/SW Contact:    Geralynn Ochs, LCSW Phone Number: 01/21/2019, 2:39 PM  Clinical Narrative:  Patient from home with boyfriend. Doesn't drive, her boyfriend drives. Does not use any equipment at home. Uses [facility] [address] or [address] [facility]. PCP is Kathe Becton.   CSW also discussed patient's alcohol abuse, and patient acknowledged that she needs to get help with that again. Patient said she's been to [facility] outpatient before and had recently called to get that set back up but couldn't figure out how to do the telehealth appointment that they set up. Patient said that she would follow back up with them and ask for help with the technology to ensure that she gets the help that she needs. CSW provided resources for additional support that the patient could access if she was interested. Patient appreciative of information.                  Expected Discharge Plan: Home/Self Care Barriers to Discharge: Continued Medical Work up   Patient Goals and CMS Choice Patient states their goals for this hospitalization and ongoing recovery are:: feel better and go home      Expected Discharge Plan and Services Expected Discharge Plan: Home/Self Care                                              Prior Living Arrangements/Services   Lives with:: Self, Significant Other                   Activities of Daily Living      Permission Sought/Granted                  Emotional Assessment Appearance:: Appears stated age Attitude/Demeanor/Rapport: Engaged Affect (typically observed): Pleasant Orientation: : Oriented to Self, Oriented to Place, Oriented to  Time, Oriented to Situation Alcohol / Substance Use: Alcohol Use Psych Involvement: No (comment)  Admission diagnosis:   Thrombocytopenia ([place]) [D69.6] Injury of head, initial encounter [S09.90XA] Scalp laceration, initial encounter [I43.32RJ] Alcoholic intoxication without complication ([place]) [J88.416] Patient Active Problem List   Diagnosis Date Noted  . Fall 01/20/2019  . Epistaxis 01/20/2019  . Altered mental status   . 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 Anxiety 10/27/2018  . Neuropathy 10/27/2018  . Abscess of bursa of left elbow 09/23/2018  . Olecranon bursitis of left elbow   . Erysipelas 09/13/2018  . Acute metabolic encephalopathy 60/63/0160  . Overdose 08/22/2018  . Hypotension 08/22/2018  . Seizure ([place]) 08/22/2018  . Substance induced mood disorder ([place]) 07/17/2018  . Cocaine dependence without complication ([place]) 10/93/2355  . Polysubstance abuse ([place]) 04/08/2018  . Encephalopathy, 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[place]) 04/08/2018  . Hepatitis C 03/18/2018  . Portal hypertension ([place]) 03/18/2018  . Alcohol abuse with alcohol-induced disorder ([place]) 03/18/2018  . Depression 03/18/2018  . Upper GI bleed 02/10/2018  . Alcohol withdrawal, with unspecified complication ([place]) 73/22/0254  . Chronic anemia   . Thrombocytopenia due to drugs   . Suicidal ideation   . Thrombocytopenia ([place]) 01/02/2018  . GERD (gastroesophageal reflux disease) 11/01/2017  . Trichimoniasis 10/30/2017  . Alcohol dependence ([place]) 10/29/2017  . MDD (major depressive disorder), severe ([place]) 10/28/2017  .  Acute hyperactive alcohol withdrawal delirium ([place]) 10/09/2017  . Schizophrenia ([place]) 10/09/2017  . Alcohol abuse with alcohol-induced mood disorder ([place])   . Alcohol withdrawal syndrome with complication ([place]) 83/38/2505  . Esophageal varices without bleeding ([place])   . Hematemesis 09/01/2017  . Ascites due to alcoholic cirrhosis ([place])   . Decompensated hepatic cirrhosis ([place]) 07/23/2017  . Alcohol abuse 07/23/2017  . Hepatitis C antibody positive in blood 07/23/2017  . Hypokalemia 07/23/2017  . Jaundice 07/23/2017   . Coagulopathy ([place]) 07/23/2017  . Hypomagnesemia 07/23/2017  . Pancytopenia ([place]) 07/23/2017  . Alcoholic cirrhosis of liver with ascites ([place]) 07/23/2017  . Bacterial vaginosis 06/03/2017  . UTI (urinary tract infection) 06/02/2017   PCP:  [name], FNP Pharmacy:   [facility], [place] [address] [place] [address] Phone: [phone] Fax: [id number] - [place], [place] [address] Phone: [phone] Fax: [phone]  [facility], [place] [address] Phone: [phone] Fax: [phone]  [facility] [phone] - [place], [place] - [address] [place] [address] Phone: [phone] Fax: [phone]     Social Determinants of Health (SDOH) Interventions    Readmission Risk Interventions No flowsheet data found.

## 2019-01-21 NOTE — Evaluation (Signed)
PT Cancellation Note  Patient Details Name: Briana Sweeney MRN: [id number] DOB: 1971/08/25   Cancelled Treatment:    Reason PT Evaluatation Not Completed: Pain limiting ability to participate;Fatigue/lethargy limiting ability to participate. She refused in the morning due to pain and states PT could come back in the afternoon. When PT returned she had just been up and to the bathroom with Nursing and just returned to bed and was too tired to participate in PT. (Nursing confirms this and that she had good mobility).  PT will follow back up with this patient in the morning.    Debbe Odea 01/21/2019, 2:04 PM

## 2019-01-21 NOTE — Progress Notes (Signed)
Pt admitted to the unit from ED: pt alert and verbally responsive; VSS: telemetry applied and verified with CCMD; NT called to second verify. Pt oriented to the unit and room; fall/safety precaution and prevention education completed with pt; pt voices understanding and denies any questions. Pt head has laceration with sutures opened to air. No pressure ulcers noted; bed alarm on; call light within reach; will closely monitor pt. Delia Heady RN   01/20/19 2321  Vitals  Temp 98 F (36.7 C)  Temp Source Oral  BP 113/84  MAP (mmHg) 95  BP Location Right Arm  BP Method Automatic  Patient Position (if appropriate) Lying  Pulse Rate 86  Pulse Rate Source Monitor  Resp 16  Oxygen Therapy  SpO2 99 %  O2 Device Room Air  Pain Assessment  Pain Scale 0-10  Pain Score 6  Pain Type Acute pain  Pain Location Head  Pain Descriptors / Indicators Aching  Pain Intervention(s) Emotional support;Medication (See eMAR);Repositioned;Rest  Height and Weight  Weight 57.3 kg  Type of Scale Used Bed  MEWS Score  MEWS RR 0  MEWS Pulse 0  MEWS Systolic 0  MEWS LOC 0  MEWS Temp 0  MEWS Score 0  MEWS Score Color Nyoka Cowden

## 2019-01-21 NOTE — Progress Notes (Signed)
PROGRESS NOTE  Briana Sweeney LFY:[id number] DOB: 1971-08-17 DOA: 01/20/2019 PCP: [name], FNP  HPI/Recap of past 24 hours:  Right sided headahce, tremor  Assessment/Plan: Principal Problem:   Fall Active Problems:   Decompensated hepatic cirrhosis ([place])   Alcohol abuse   Hepatitis C antibody positive in blood   Schizophrenia ([place])   GERD (gastroesophageal reflux disease)   Thrombocytopenia (HCC)   Polysubstance abuse ([place])   Epistaxis  Alcohol intoxication with Fall and epistaxis on presentation CT head no acute findings Ivf, ciwa protocal May need to repeat ct head if persistent altered  Thrombocytopenia plt 16, s/p plt transfusion, plt this am is 30, epistaxis has resolved  Alcohol cirrhosis  Seizure Continue dilantin   H/o hep c , unclear detail  Polysubstance abuse  Recurrent hospitalizations 12th hospitalization since 09/2017 at [place]  Code Status: full  Family Communication: patient   Disposition Plan: not ready to discharge   Consultants:  none  Procedures:  none  Antibiotics:  none   Objective: BP 97/62 (BP Location: Right Arm)   Pulse (!) 102   Temp 98.5 F (36.9 C) (Oral)   Resp 16   Wt 57.3 kg   SpO2 93%   BMI 24.67 kg/m   Intake/Output Summary (Last 24 hours) at 01/21/2019 0730 Last data filed at 01/21/2019 8527 Gross per 24 hour  Intake 457.17 ml  Output -  Net 457.17 ml   Filed Weights   01/20/19 2321  Weight: 57.3 kg    Exam: Patient is examined daily including today on 01/21/2019, exams remain the same as of yesterday except that has changed    General:  Aaox3, tremor  Cardiovascular: RRR  Respiratory: CTABL  Abdomen: Soft/ND/NT, positive BS  Musculoskeletal: No Edema  Neuro: alert, oriented   Data Reviewed: Basic Metabolic Panel: Recent Labs  Lab 01/20/19 1942 01/21/19 0322  NA 141 144  K 3.8 3.9  CL 107 108  CO2 18* 21*  GLUCOSE 126* 90  BUN 6 5*  CREATININE 0.66 0.59   CALCIUM 8.3* 7.8*   Liver Function Tests: Recent Labs  Lab 01/21/19 0322  AST 91*  ALT 31  ALKPHOS 174*  BILITOT 0.9  PROT 7.9  ALBUMIN 3.2*   No results for input(s): LIPASE, AMYLASE in the last 168 hours. No results for input(s): AMMONIA in the last 168 hours. CBC: Recent Labs  Lab 01/20/19 1942 01/21/19 0322  WBC 3.0* 5.4  NEUTROABS 1.0*  --   HGB 14.3 12.9  HCT 42.8 38.7  MCV 94.1 93.7  PLT 16* 30*   Cardiac Enzymes:   No results for input(s): CKTOTAL, CKMB, CKMBINDEX, TROPONINI in the last 168 hours. BNP (last 3 results) No results for input(s): BNP in the last 8760 hours.  ProBNP (last 3 results) No results for input(s): PROBNP in the last 8760 hours.  CBG: No results for input(s): GLUCAP in the last 168 hours.  Recent Results (from the past 240 hour(s))  SARS Coronavirus 2 (CEPHEID - Performed in [place] hospital lab), Hosp Order     Status: None   Collection Time: 01/20/19 10:44 PM  Result Value Ref Range Status   SARS Coronavirus 2 NEGATIVE NEGATIVE Final    Comment: (NOTE) If result is NEGATIVE SARS-CoV-2 target nucleic acids are NOT DETECTED. The SARS-CoV-2 RNA is generally detectable in upper and lower  respiratory specimens during the acute phase of infection. The lowest  concentration of SARS-CoV-2 viral copies this assay can detect is 250  copies / mL. A negative result does not preclude SARS-CoV-2 infection  and should not be used as the sole basis for treatment or other  patient management decisions.  A negative result may occur with  improper specimen collection / handling, submission of specimen other  than nasopharyngeal swab, presence of viral mutation(s) within the  areas targeted by this assay, and inadequate number of viral copies  (<250 copies / mL). A negative result must be combined with clinical  observations, patient history, and epidemiological information. If result is POSITIVE SARS-CoV-2 target nucleic acids are DETECTED.  The SARS-CoV-2 RNA is generally detectable in upper and lower  respiratory specimens dur ing the acute phase of infection.  Positive  results are indicative of active infection with SARS-CoV-2.  Clinical  correlation with patient history and other diagnostic information is  necessary to determine patient infection status.  Positive results do  not rule out bacterial infection or co-infection with other viruses. If result is PRESUMPTIVE POSTIVE SARS-CoV-2 nucleic acids MAY BE PRESENT.   A presumptive positive result was obtained on the submitted specimen  and confirmed on repeat testing.  While 2019 novel coronavirus  (SARS-CoV-2) nucleic acids may be present in the submitted sample  additional confirmatory testing may be necessary for epidemiological  and / or clinical management purposes  to differentiate between  SARS-CoV-2 and other Sarbecovirus currently known to infect humans.  If clinically indicated additional testing with an alternate test  methodology [phone]) is advised. The SARS-CoV-2 RNA is generally  detectable in upper and lower respiratory sp ecimens during the acute  phase of infection. The expected result is Negative. Fact Sheet for Patients:  StrictlyIdeas.no Fact Sheet for Healthcare Providers: BankingDealers.co.za This test is not yet approved or cleared by the [place] FDA and has been authorized for detection and/or diagnosis of SARS-CoV-2 by FDA under an Emergency Use Authorization (EUA).  This EUA will remain in effect (meaning this test can be used) for the duration of the COVID-19 declaration under Section 564(b)(1) of the Act, 21 U.S.C. section 360bbb-3(b)(1), unless the authorization is terminated or revoked sooner. Performed at [facility], [place] [address] [place], [place] [address]      Studies: Ct Head Wo Contrast  Result Date: 01/20/2019 CLINICAL DATA:  Status post trip and fall today  with a blow to the head and scalp laceration. Initial encounter. EXAM: CT HEAD WITHOUT CONTRAST CT CERVICAL SPINE WITHOUT CONTRAST TECHNIQUE: Multidetector CT imaging of the head and cervical spine was performed following the standard protocol without intravenous contrast. Multiplanar CT image reconstructions of the cervical spine were also generated. COMPARISON:  Head and cervical spine CT scans 09/06/2018. FINDINGS: CT HEAD FINDINGS Brain: No evidence of acute infarction, hemorrhage, hydrocephalus, extra-axial collection or mass lesion/mass effect. Cortical atrophy again seen. Vascular: No hyperdense vessel or unexpected calcification. Skull: Intact.  No focal lesion. Sinuses/Orbits: Negative. Other: Scalp laceration on the right noted. CT CERVICAL SPINE FINDINGS Alignment: Maintained with straightening of lordosis noted. Skull base and vertebrae: No acute fracture. No primary bone lesion or focal pathologic process. Soft tissues and spinal canal: No prevertebral fluid or swelling. No visible canal hematoma. Disc levels: Disc space height is maintained. Facet arthropathy on the left at C4-5 noted. Upper chest: Lung apices clear. Other: None. IMPRESSION: Scalp laceration on the right without underlying fracture. No other acute abnormality head or cervical spine. Cortical atrophy. Electronically Signed   By: Inge Rise M.D.   On: 01/20/2019 18:28   Ct  Cervical Spine Wo Contrast  Result Date: 01/20/2019 CLINICAL DATA:  Status post trip and fall today with a blow to the head and scalp laceration. Initial encounter. EXAM: CT HEAD WITHOUT CONTRAST CT CERVICAL SPINE WITHOUT CONTRAST TECHNIQUE: Multidetector CT imaging of the head and cervical spine was performed following the standard protocol without intravenous contrast. Multiplanar CT image reconstructions of the cervical spine were also generated. COMPARISON:  Head and cervical spine CT scans 09/06/2018. FINDINGS: CT HEAD FINDINGS Brain: No evidence of  acute infarction, hemorrhage, hydrocephalus, extra-axial collection or mass lesion/mass effect. Cortical atrophy again seen. Vascular: No hyperdense vessel or unexpected calcification. Skull: Intact.  No focal lesion. Sinuses/Orbits: Negative. Other: Scalp laceration on the right noted. CT CERVICAL SPINE FINDINGS Alignment: Maintained with straightening of lordosis noted. Skull base and vertebrae: No acute fracture. No primary bone lesion or focal pathologic process. Soft tissues and spinal canal: No prevertebral fluid or swelling. No visible canal hematoma. Disc levels: Disc space height is maintained. Facet arthropathy on the left at C4-5 noted. Upper chest: Lung apices clear. Other: None. IMPRESSION: Scalp laceration on the right without underlying fracture. No other acute abnormality head or cervical spine. Cortical atrophy. Electronically Signed   By: Inge Rise M.D.   On: 01/20/2019 18:28    Scheduled Meds: . famotidine  20 mg Oral Daily  . FLUoxetine  40 mg Oral Daily  . folic acid  1 mg Oral Daily  . gabapentin  600 mg Oral BID  . haloperidol  2 mg Oral QHS  . lactulose  20 g Oral TID  . lidocaine-EPINEPHrine  10 mL Infiltration Once  . multivitamin with minerals  1 tablet Oral Daily  . naltrexone  50 mg Oral Daily  . ondansetron  4 mg Intravenous Once  . phenytoin  300 mg Oral Daily  . thiamine  100 mg Oral Daily   Or  . thiamine  100 mg Intravenous Daily  . topiramate  50 mg Oral Daily  . traZODone  100 mg Oral QHS    Continuous Infusions: . lactated ringers       Time spent: 54mins I have personally reviewed and interpreted on  01/21/2019 daily labs, tele strips, imagings as discussed above under date review session and assessment and plans.  I reviewed all nursing notes, pharmacy notes,  vitals, pertinent old records  I have discussed plan of care as described above with RN , patient  on 01/21/2019   Florencia Reasons MD, PhD  [facility] Pager [phone]. If 7PM-7AM,  please contact night-coverage at [email or web address], password [facility] 01/21/2019, 7:30 AM  LOS: 1 day

## 2019-01-22 ENCOUNTER — Inpatient Hospital Stay (HOSPITAL_COMMUNITY): Payer: Medicaid Other

## 2019-01-22 ENCOUNTER — Ambulatory Visit: Payer: Self-pay | Admitting: Family Medicine

## 2019-01-22 LAB — COMPREHENSIVE METABOLIC PANEL
ALT: 29 U/L (ref 0–44)
AST: 92 U/L — ABNORMAL HIGH (ref 15–41)
Albumin: 3.1 g/dL — ABNORMAL LOW (ref 3.5–5.0)
Alkaline Phosphatase: 175 U/L — ABNORMAL HIGH (ref 38–126)
Anion gap: 8 (ref 5–15)
BUN: <5 mg/dL — ABNORMAL LOW (ref 6–20)
CO2: 24 mmol/L (ref 22–32)
Calcium: 8.9 mg/dL (ref 8.9–10.3)
Chloride: 100 mmol/L (ref 98–111)
Creatinine, Ser: 0.65 mg/dL (ref 0.44–1.00)
GFR calc Af Amer: >60 mL/min (ref >60)
GFR calc non Af Amer: >60 mL/min (ref >60)
Glucose, Bld: 113 mg/dL — ABNORMAL HIGH (ref 70–99)
Potassium: 3.7 mmol/L (ref 3.5–5.1)
Sodium: 132 mmol/L — ABNORMAL LOW (ref 135–145)
Total Bilirubin: 2.9 mg/dL — ABNORMAL HIGH (ref 0.3–1.2)
Total Protein: 7.8 g/dL (ref 6.5–8.1)

## 2019-01-22 LAB — BPAM PLATELET PHERESIS
Blood Product Expiration Date: 202005142359
ISSUE DATE / TIME: 202005140011
Unit Type and Rh: 7300

## 2019-01-22 LAB — CBC WITH DIFFERENTIAL/PLATELET
Abs Immature Granulocytes: 0 K/uL (ref 0.00–0.07)
Basophils Absolute: 0 K/uL (ref 0.0–0.1)
Basophils Relative: 1 %
Eosinophils Absolute: 0.1 K/uL (ref 0.0–0.5)
Eosinophils Relative: 4 %
HCT: 37.5 % (ref 36.0–46.0)
Hemoglobin: 12.9 g/dL (ref 12.0–15.0)
Immature Granulocytes: 0 %
Lymphocytes Relative: 36 %
Lymphs Abs: 0.8 K/uL (ref 0.7–4.0)
MCH: 31.6 pg (ref 26.0–34.0)
MCHC: 34.4 g/dL (ref 30.0–36.0)
MCV: 91.9 fL (ref 80.0–100.0)
Monocytes Absolute: 0.4 K/uL (ref 0.1–1.0)
Monocytes Relative: 20 %
Neutro Abs: 0.9 K/uL — ABNORMAL LOW (ref 1.7–7.7)
Neutrophils Relative %: 39 %
Platelets: 11 K/uL — CL (ref 150–400)
RBC: 4.08 MIL/uL (ref 3.87–5.11)
RDW: 16.3 % — ABNORMAL HIGH (ref 11.5–15.5)
WBC: 2.2 K/uL — ABNORMAL LOW (ref 4.0–10.5)
nRBC: 0 % (ref 0.0–0.2)

## 2019-01-22 LAB — PREPARE PLATELET PHERESIS: Unit division: 0

## 2019-01-22 LAB — MAGNESIUM: Magnesium: 1.8 mg/dL (ref 1.7–2.4)

## 2019-01-22 LAB — MRSA PCR SCREENING: MRSA by PCR: NEGATIVE

## 2019-01-22 MED ORDER — CLONIDINE HCL 0.1 MG PO TABS: 0.1000 mg | ORAL_TABLET | Freq: Every day | ORAL | Status: DC

## 2019-01-22 MED ORDER — SODIUM CHLORIDE 0.9% IV SOLUTION
Freq: Once | INTRAVENOUS | Status: AC
  Administered 2019-01-23: 07:00:00 via INTRAVENOUS

## 2019-01-22 MED ORDER — LORAZEPAM 1 MG PO TABS
1.0000 mg | ORAL_TABLET | ORAL | Status: DC | PRN
  Administered 2019-01-23 – 2019-01-26 (×7): 1 mg via ORAL
  Filled 2019-01-22 (×8): qty 1

## 2019-01-22 MED ORDER — DICYCLOMINE HCL 20 MG PO TABS
20.0000 mg | ORAL_TABLET | Freq: Four times a day (QID) | ORAL | Status: DC | PRN
  Administered 2019-01-26: 13:00:00 20 mg via ORAL
  Filled 2019-01-22: qty 1

## 2019-01-22 MED ORDER — ONDANSETRON 4 MG PO TBDP
4.0000 mg | ORAL_TABLET | Freq: Four times a day (QID) | ORAL | Status: DC | PRN
  Administered 2019-01-26: 13:00:00 4 mg via ORAL
  Filled 2019-01-22: qty 1

## 2019-01-22 MED ORDER — METHOCARBAMOL 500 MG PO TABS: 500.0000 mg | ORAL_TABLET | Freq: Three times a day (TID) | ORAL | Status: DC | PRN

## 2019-01-22 MED ORDER — HYDROXYZINE HCL 25 MG PO TABS: 25.0000 mg | ORAL_TABLET | Freq: Four times a day (QID) | ORAL | Status: DC | PRN

## 2019-01-22 MED ORDER — LOPERAMIDE HCL 2 MG PO CAPS: 2.0000 mg | ORAL_CAPSULE | ORAL | Status: DC | PRN

## 2019-01-22 MED ORDER — LACTATED RINGERS IV SOLN
INTRAVENOUS | Status: AC
  Administered 2019-01-22: 17:00:00 via INTRAVENOUS

## 2019-01-22 MED ORDER — CLONIDINE HCL 0.1 MG PO TABS
0.1000 mg | ORAL_TABLET | ORAL | Status: AC
  Administered 2019-01-24 – 2019-01-26 (×4): 0.1 mg via ORAL
  Filled 2019-01-22 (×4): qty 1

## 2019-01-22 MED ORDER — LORAZEPAM 2 MG/ML IJ SOLN: 1.0000 mg | INTRAMUSCULAR | Status: DC | PRN

## 2019-01-22 MED ORDER — CLONIDINE HCL 0.1 MG PO TABS
0.1000 mg | ORAL_TABLET | Freq: Four times a day (QID) | ORAL | Status: AC
  Administered 2019-01-22 – 2019-01-24 (×9): 0.1 mg via ORAL
  Filled 2019-01-22 (×9): qty 1

## 2019-01-22 NOTE — Evaluation (Signed)
PT Cancellation Note  Patient Details Name: Briana Sweeney MRN: [id number] DOB: 01/27/1971   Cancelled Treatment:    Pt again refused PT evaluation for 2nd day in a row, she says she does not need PT as she has good mobility and is independent. PT Will take off case load.   Elsie Ra, PT,DPT Acute Rehabilitation Services Office [phone]

## 2019-01-22 NOTE — Progress Notes (Signed)
CRITICAL VALUE ALERT  Critical Value:  Platelet 11000  Date & Time Notied: 0510, 01/22/19  Provider Notified: Rolin Barry Schorr  Orders Received/Actions taken:No orders at this time

## 2019-01-22 NOTE — Progress Notes (Signed)
PROGRESS NOTE  Briana Sweeney YBO:[id number] DOB: 1971-07-28 DOA: 01/20/2019 PCP: [name], FNP  HPI/Recap of past 24 hours:   Less tremor, no confusion, but increased fidgeting , trying to get out of bed by herself Continue to have intermittent epistaxis, She vomited this am, no hemetemasis She reports lower abdominal pain and back pain which is chronic No fever  Assessment/Plan: Principal Problem:   Fall Active Problems:   Decompensated hepatic cirrhosis (HCC)   Alcohol abuse   Hepatitis C antibody positive in blood   Schizophrenia (HCC)   GERD (gastroesophageal reflux disease)   Thrombocytopenia (HCC)   Polysubstance abuse ([place])   Alcoholic intoxication without complication ([place])   Epistaxis   Head injury   Scalp laceration, initial encounter  Alcohol intoxication with Fall and epistaxis on presentation CT head on admission no intracranial hemorrhage Continue Ivf, ciwa protocal, increase ativan from q6 to q4prn, add clonidine  repeat ct head no intracranial hemorrhage  N/v/ab pain Will check lipase,  kub no acute findings Change diet to clears,    Thrombocytopenia with intermittent epistaxis, right scalp hematoma plt 16 on presentation,  s/p plt transfusion on 5/14, plt remain low, getting another plt on 5/15 Monitor  Alcohol cirrhosis  Seizure Continue dilantin   H/o hep c , unclear detail  Polysubstance abuse  Recurrent hospitalizations 12th hospitalization since 09/2017 at [place]  Poor prognosis  Code Status: full  Family Communication: patient , sister Briana Sweeney over the phone with permission   Disposition Plan: not ready to discharge   Consultants:  none  Procedures:  none  Antibiotics:  none   Objective: BP (!) 130/100   Pulse 92   Temp 98.9 F (37.2 C) (Oral)   Resp 18   Wt 57.3 kg   SpO2 98%   BMI 24.67 kg/m   Intake/Output Summary (Last 24 hours) at 01/22/2019 1233 Last data filed at 01/22/2019 0857 Gross  per 24 hour  Intake 1457.93 ml  Output 3250 ml  Net -1792.07 ml   Filed Weights   01/20/19 2321  Weight: 57.3 kg    Exam: Patient is examined daily including today on 01/22/2019, exams remain the same as of yesterday except that has changed    General:  Aaox3, tremor  Cardiovascular: sinus tachycardia  Respiratory: CTABL  Abdomen: Soft/ND/NT, positive BS  Musculoskeletal: No Edema  Neuro: alert, oriented   Data Reviewed: Basic Metabolic Panel: Recent Labs  Lab 01/20/19 1942 01/21/19 0322 01/22/19 0354  NA 141 144 132*  K 3.8 3.9 3.7  CL 107 108 100  CO2 18* 21* 24  GLUCOSE 126* 90 113*  BUN 6 5* <5*  CREATININE 0.66 0.59 0.65  CALCIUM 8.3* 7.8* 8.9  MG  --   --  1.8   Liver Function Tests: Recent Labs  Lab 01/21/19 0322 01/22/19 0354  AST 91* 92*  ALT 31 29  ALKPHOS 174* 175*  BILITOT 0.9 2.9*  PROT 7.9 7.8  ALBUMIN 3.2* 3.1*   No results for input(s): LIPASE, AMYLASE in the last 168 hours. No results for input(s): AMMONIA in the last 168 hours. CBC: Recent Labs  Lab 01/20/19 1942 01/21/19 0322 01/22/19 0354  WBC 3.0* 5.4 2.2*  NEUTROABS 1.0*  --  0.9*  HGB 14.3 12.9 12.9  HCT 42.8 38.7 37.5  MCV 94.1 93.7 91.9  PLT 16* 30* 11*   Cardiac Enzymes:   No results for input(s): CKTOTAL, CKMB, CKMBINDEX, TROPONINI in the last 168 hours. BNP (last 3 results)  No results for input(s): BNP in the last 8760 hours.  ProBNP (last 3 results) No results for input(s): PROBNP in the last 8760 hours.  CBG: No results for input(s): GLUCAP in the last 168 hours.  Recent Results (from the past 240 hour(s))  SARS Coronavirus 2 (CEPHEID - Performed in [place] hospital lab), Hosp Order     Status: None   Collection Time: 01/20/19 10:44 PM  Result Value Ref Range Status   SARS Coronavirus 2 NEGATIVE NEGATIVE Final    Comment: (NOTE) If result is NEGATIVE SARS-CoV-2 target nucleic acids are NOT DETECTED. The SARS-CoV-2 RNA is generally detectable in  upper and lower  respiratory specimens during the acute phase of infection. The lowest  concentration of SARS-CoV-2 viral copies this assay can detect is 250  copies / mL. A negative result does not preclude SARS-CoV-2 infection  and should not be used as the sole basis for treatment or other  patient management decisions.  A negative result may occur with  improper specimen collection / handling, submission of specimen other  than nasopharyngeal swab, presence of viral mutation(s) within the  areas targeted by this assay, and inadequate number of viral copies  (<250 copies / mL). A negative result must be combined with clinical  observations, patient history, and epidemiological information. If result is POSITIVE SARS-CoV-2 target nucleic acids are DETECTED. The SARS-CoV-2 RNA is generally detectable in upper and lower  respiratory specimens dur ing the acute phase of infection.  Positive  results are indicative of active infection with SARS-CoV-2.  Clinical  correlation with patient history and other diagnostic information is  necessary to determine patient infection status.  Positive results do  not rule out bacterial infection or co-infection with other viruses. If result is PRESUMPTIVE POSTIVE SARS-CoV-2 nucleic acids MAY BE PRESENT.   A presumptive positive result was obtained on the submitted specimen  and confirmed on repeat testing.  While 2019 novel coronavirus  (SARS-CoV-2) nucleic acids may be present in the submitted sample  additional confirmatory testing may be necessary for epidemiological  and / or clinical management purposes  to differentiate between  SARS-CoV-2 and other Sarbecovirus currently known to infect humans.  If clinically indicated additional testing with an alternate test  methodology [phone]) is advised. The SARS-CoV-2 RNA is generally  detectable in upper and lower respiratory sp ecimens during the acute  phase of infection. The expected result is  Negative. Fact Sheet for Patients:  StrictlyIdeas.no Fact Sheet for Healthcare Providers: BankingDealers.co.za This test is not yet approved or cleared by the [place] FDA and has been authorized for detection and/or diagnosis of SARS-CoV-2 by FDA under an Emergency Use Authorization (EUA).  This EUA will remain in effect (meaning this test can be used) for the duration of the COVID-19 declaration under Section 564(b)(1) of the Act, 21 U.S.C. section 360bbb-3(b)(1), unless the authorization is terminated or revoked sooner. Performed at [facility], [place] [address], [place], [place] [address]   Culture, blood (routine x 2)     Status: None (Preliminary result)   Collection Time: 01/21/19  7:50 PM  Result Value Ref Range Status   Specimen Description BLOOD LEFT ANTECUBITAL  Final   Special Requests   Final    BOTTLES DRAWN AEROBIC AND ANAEROBIC Blood Culture adequate volume   Culture   Final    NO GROWTH < 24 HOURS Performed at [facility], [place] [address] [place],  [address]    Report Status  PENDING  Incomplete  Culture, blood (routine x 2)     Status: None (Preliminary result)   Collection Time: 01/21/19  8:03 PM  Result Value Ref Range Status   Specimen Description BLOOD LEFT WRIST  Final   Special Requests   Final    BOTTLES DRAWN AEROBIC AND ANAEROBIC Blood Culture results may not be optimal due to an inadequate volume of blood received in culture bottles   Culture   Final    NO GROWTH < 24 HOURS Performed at [facility], [address] [place], [place] [address]    Report Status PENDING  Incomplete  MRSA PCR Screening     Status: None   Collection Time: 01/21/19 10:40 PM  Result Value Ref Range Status   MRSA by PCR NEGATIVE NEGATIVE Final    Comment:        The GeneXpert MRSA Assay (FDA approved for NASAL specimens only), is one component of a comprehensive MRSA colonization surveillance  program. It is not intended to diagnose MRSA infection nor to guide or monitor treatment for MRSA infections. Performed at [facility], [place] [address], [place], [place] [address]      Studies: Ct Head Wo Contrast  Result Date: 01/21/2019 CLINICAL DATA:  Initial evaluation for acute severe headache status post fall. EXAM: CT HEAD WITHOUT CONTRAST TECHNIQUE: Contiguous axial images were obtained from the base of the skull through the vertex without intravenous contrast. COMPARISON:  Prior CT from 01/20/2019 FINDINGS: Brain: Age-related cerebral atrophy. Small parenchymal calcification noted within the mid left cerebellum, stable. No acute intracranial hemorrhage. No acute large vessel territory infarct. No mass lesion, midline shift or mass effect. No hydrocephalus. No extra-axial fluid collection. Vascular: No hyperdense vessel. Skull: Evolving right parietal temporal scalp contusion. Calvarium intact. Sinuses/Orbits: Globes orbital soft tissues demonstrate no acute finding. Paranasal sinuses and mastoid air cells are clear. Other: None. IMPRESSION: 1. No acute intracranial hemorrhage or other abnormality identified. 2. Evolving right parietotemporal scalp contusion. No calvarial fracture. Electronically Signed   By: Jeannine Boga M.D.   On: 01/21/2019 18:04    Scheduled Meds: . sodium chloride   Intravenous Once  . famotidine  20 mg Oral Daily  . FLUoxetine  40 mg Oral Daily  . folic acid  1 mg Oral Daily  . gabapentin  600 mg Oral BID  . haloperidol  2 mg Oral QHS  . lactulose  20 g Oral TID  . lidocaine-EPINEPHrine  10 mL Infiltration Once  . multivitamin with minerals  1 tablet Oral Daily  . naltrexone  50 mg Oral Daily  . ondansetron  4 mg Intravenous Once  . phenytoin  300 mg Oral Daily  . polyethylene glycol  17 g Oral Daily  . senna-docusate  1 tablet Oral BID  . thiamine  100 mg Oral Daily   Or  . thiamine  100 mg Intravenous Daily  . topiramate  50 mg Oral  Daily  . traZODone  100 mg Oral QHS    Continuous Infusions:    Time spent: 66mins I have personally reviewed and interpreted on  01/22/2019 daily labs, tele strips, imagings as discussed above under date review session and assessment and plans.  I reviewed all nursing notes, pharmacy notes,  vitals, pertinent old records  I have discussed plan of care as described above with RN , patient and family on 01/22/2019   Florencia Reasons MD, PhD  [facility] Pager [phone]. If 7PM-7AM, please contact night-coverage at  [email or web address], password [facility] 01/22/2019, 12:33 PM  LOS: 2 days

## 2019-01-23 LAB — CBC WITH DIFFERENTIAL/PLATELET
Abs Immature Granulocytes: 0.02 10*3/uL (ref 0.00–0.07)
Basophils Absolute: 0 10*3/uL (ref 0.0–0.1)
Basophils Relative: 0 %
Eosinophils Absolute: 0 10*3/uL (ref 0.0–0.5)
Eosinophils Relative: 1 %
HCT: 35.6 % — ABNORMAL LOW (ref 36.0–46.0)
Hemoglobin: 12.3 g/dL (ref 12.0–15.0)
Immature Granulocytes: 0 %
Lymphocytes Relative: 12 %
Lymphs Abs: 0.6 10*3/uL — ABNORMAL LOW (ref 0.7–4.0)
MCH: 31.9 pg (ref 26.0–34.0)
MCHC: 34.6 g/dL (ref 30.0–36.0)
MCV: 92.5 fL (ref 80.0–100.0)
Monocytes Absolute: 0.4 10*3/uL (ref 0.1–1.0)
Monocytes Relative: 9 %
Neutro Abs: 3.7 10*3/uL (ref 1.7–7.7)
Neutrophils Relative %: 78 %
Platelets: 16 10*3/uL — CL (ref 150–400)
RBC: 3.85 MIL/uL — ABNORMAL LOW (ref 3.87–5.11)
RDW: 16.3 % — ABNORMAL HIGH (ref 11.5–15.5)
WBC: 4.8 10*3/uL (ref 4.0–10.5)
nRBC: 0 % (ref 0.0–0.2)

## 2019-01-23 LAB — RAPID URINE DRUG SCREEN, HOSP PERFORMED
Amphetamines: NOT DETECTED
Barbiturates: NOT DETECTED
Benzodiazepines: POSITIVE — AB
Cocaine: NOT DETECTED
Opiates: NOT DETECTED
Tetrahydrocannabinol: NOT DETECTED

## 2019-01-23 LAB — COMPREHENSIVE METABOLIC PANEL
ALT: 28 U/L (ref 0–44)
AST: 73 U/L — ABNORMAL HIGH (ref 15–41)
Albumin: 3 g/dL — ABNORMAL LOW (ref 3.5–5.0)
Alkaline Phosphatase: 140 U/L — ABNORMAL HIGH (ref 38–126)
Anion gap: 11 (ref 5–15)
BUN: <5 mg/dL — ABNORMAL LOW (ref 6–20)
CO2: 22 mmol/L (ref 22–32)
Calcium: 9 mg/dL (ref 8.9–10.3)
Chloride: 101 mmol/L (ref 98–111)
Creatinine, Ser: 0.54 mg/dL (ref 0.44–1.00)
GFR calc Af Amer: >60 mL/min (ref >60)
GFR calc non Af Amer: >60 mL/min (ref >60)
Glucose, Bld: 110 mg/dL — ABNORMAL HIGH (ref 70–99)
Potassium: 3.3 mmol/L — ABNORMAL LOW (ref 3.5–5.1)
Sodium: 134 mmol/L — ABNORMAL LOW (ref 135–145)
Total Bilirubin: 2.6 mg/dL — ABNORMAL HIGH (ref 0.3–1.2)
Total Protein: 7.9 g/dL (ref 6.5–8.1)

## 2019-01-23 LAB — AMMONIA: Ammonia: 71 umol/L — ABNORMAL HIGH (ref 9–35)

## 2019-01-23 LAB — LIPASE, BLOOD: Lipase: 47 U/L (ref 11–51)

## 2019-01-23 LAB — PROTIME-INR
INR: 1.3 — ABNORMAL HIGH (ref 0.8–1.2)
Prothrombin Time: 15.5 seconds — ABNORMAL HIGH (ref 11.4–15.2)

## 2019-01-23 LAB — MAGNESIUM: Magnesium: 1.8 mg/dL (ref 1.7–2.4)

## 2019-01-23 MED ORDER — POTASSIUM CHLORIDE CRYS ER 20 MEQ PO TBCR
40.0000 meq | EXTENDED_RELEASE_TABLET | Freq: Once | ORAL | Status: AC
  Administered 2019-01-23: 09:00:00 40 meq via ORAL
  Filled 2019-01-23: qty 2

## 2019-01-23 MED ORDER — SODIUM CHLORIDE 0.9% IV SOLUTION: Freq: Once | INTRAVENOUS | Status: DC

## 2019-01-23 MED ORDER — MAGNESIUM SULFATE 2 GM/50ML IV SOLN
2.0000 g | Freq: Once | INTRAVENOUS | Status: AC
  Administered 2019-01-23: 09:00:00 2 g via INTRAVENOUS
  Filled 2019-01-23: qty 50

## 2019-01-23 NOTE — Progress Notes (Signed)
PROGRESS NOTE  Briana Sweeney HUD:[id number] DOB: Feb 13, 1971 DOA: 01/20/2019 PCP: [name], FNP  HPI/Recap of past 24 hours:  She appear less restless today , a little less tremors than yesterday but  appear drowsy than yesterday she is oriented x3,  No n/v on clears No reports overt bleeding last 24hrs No fever  She reports lower abdominal pain and back pain which is chronic  Per RN "Patient observed by NT taking 1 white pill of own medication from her purse. RN observed 1 can of natural light beer in purse, un-opened."  Will repeat uds  Assessment/Plan: Principal Problem:   Fall Active Problems:   Decompensated hepatic cirrhosis (HCC)   Alcohol abuse   Hepatitis C antibody positive in blood   Schizophrenia (HCC)   GERD (gastroesophageal reflux disease)   Thrombocytopenia (HCC)   Polysubstance abuse (HCC)   Alcoholic intoxication without complication (HCC)   Epistaxis   Head injury   Scalp laceration, initial encounter  Alcohol intoxication with Fall and epistaxis on presentation, alcohol withdrawal CT head on admission no intracranial hemorrhage  ciwa protocal, increase ativan from q6 to q4prn, add clonidine  repeat ct head no intracranial hemorrhage  N/v/ab pain  lipase and   kub no acute findings Change diet to clears, improving, advance diet as tolerated    Thrombocytopenia with intermittent epistaxis, right scalp hematoma plt 16 on presentation,  s/p plt transfusion on 5/14, plt remain low,  another plt on 5/15 Monitor  Alcohol cirrhosis With h/o ascites required paracentesis a year ago Possible mild distension on exam but soft, Consider ab [place] to evaluate ascites  Elevated ammonia level on lactulose  Seizure Continue dilantin   H/o hep c , unclear detail  Polysubstance abuse  Recurrent hospitalizations 12th hospitalization since 09/2017 at [place]  Poor prognosis  Code Status: full  Family Communication: patient , sister [name]  over the phone with permission    Disposition Plan: not ready to discharge   Consultants:  none  Procedures:  none  Antibiotics:  none   Objective: BP 120/80   Pulse 90   Temp 98.9 F (37.2 C) (Oral)   Resp 16   Wt 57.3 kg   SpO2 98%   BMI 24.67 kg/m   Intake/Output Summary (Last 24 hours) at 01/23/2019 1418 Last data filed at 01/23/2019 1035 Gross per 24 hour  Intake 1549.17 ml  Output 801 ml  Net 748.17 ml   Filed Weights   01/20/19 2321  Weight: 57.3 kg    Exam: Patient is examined daily including today on 01/23/2019, exams remain the same as of yesterday except that has changed    General:  Aaox3, slight drowsy, less tremor  Cardiovascular: sinus tachycardia has resolved  Respiratory: CTABL  Abdomen: mild distension? Soft/ND/NT, positive BS  Musculoskeletal: No Edema  Neuro: alert, oriented   Data Reviewed: Basic Metabolic Panel: Recent Labs  Lab 01/20/19 1942 01/21/19 0322 01/22/19 0354 01/23/19 0352  NA 141 144 132* 134*  K 3.8 3.9 3.7 3.3*  CL 107 108 100 101  CO2 18* 21* 24 22  GLUCOSE 126* 90 113* 110*  BUN 6 5* <5* <5*  CREATININE 0.66 0.59 0.65 0.54  CALCIUM 8.3* 7.8* 8.9 9.0  MG  --   --  1.8 1.8   Liver Function Tests: Recent Labs  Lab 01/21/19 0322 01/22/19 0354 01/23/19 0352  AST 91* 92* 73*  ALT 31 29 28   ALKPHOS 174* 175* 140*  BILITOT 0.9 2.9* 2.6*  PROT 7.9 7.8 7.9  ALBUMIN 3.2* 3.1* 3.0*   Recent Labs  Lab 01/23/19 0352  LIPASE 47   Recent Labs  Lab 01/23/19 0352  AMMONIA 71*   CBC: Recent Labs  Lab 01/20/19 1942 01/21/19 0322 01/22/19 0354 01/23/19 0352  WBC 3.0* 5.4 2.2* 4.8  NEUTROABS 1.0*  --  0.9* 3.7  HGB 14.3 12.9 12.9 12.3  HCT 42.8 38.7 37.5 35.6*  MCV 94.1 93.7 91.9 92.5  PLT 16* 30* 11* 16*   Cardiac Enzymes:   No results for input(s): CKTOTAL, CKMB, CKMBINDEX, TROPONINI in the last 168 hours. BNP (last 3 results) No results for input(s): BNP in the last 8760 hours.  ProBNP  (last 3 results) No results for input(s): PROBNP in the last 8760 hours.  CBG: No results for input(s): GLUCAP in the last 168 hours.  Recent Results (from the past 240 hour(s))  SARS Coronavirus 2 (CEPHEID - Performed in [facility] hospital lab), Hosp Order     Status: None   Collection Time: 01/20/19 10:44 PM  Result Value Ref Range Status   SARS Coronavirus 2 NEGATIVE NEGATIVE Final    Comment: (NOTE) If result is NEGATIVE SARS-CoV-2 target nucleic acids are NOT DETECTED. The SARS-CoV-2 RNA is generally detectable in upper and lower  respiratory specimens during the acute phase of infection. The lowest  concentration of SARS-CoV-2 viral copies this assay can detect is 250  copies / mL. A negative result does not preclude SARS-CoV-2 infection  and should not be used as the sole basis for treatment or other  patient management decisions.  A negative result may occur with  improper specimen collection / handling, submission of specimen other  than nasopharyngeal swab, presence of viral mutation(s) within the  areas targeted by this assay, and inadequate number of viral copies  (<250 copies / mL). A negative result must be combined with clinical  observations, patient history, and epidemiological information. If result is POSITIVE SARS-CoV-2 target nucleic acids are DETECTED. The SARS-CoV-2 RNA is generally detectable in upper and lower  respiratory specimens dur ing the acute phase of infection.  Positive  results are indicative of active infection with SARS-CoV-2.  Clinical  correlation with patient history and other diagnostic information is  necessary to determine patient infection status.  Positive results do  not rule out bacterial infection or co-infection with other viruses. If result is PRESUMPTIVE POSTIVE SARS-CoV-2 nucleic acids MAY BE PRESENT.   A presumptive positive result was obtained on the submitted specimen  and confirmed on repeat testing.  While 2019 novel  coronavirus  (SARS-CoV-2) nucleic acids may be present in the submitted sample  additional confirmatory testing may be necessary for epidemiological  and / or clinical management purposes  to differentiate between  SARS-CoV-2 and other Sarbecovirus currently known to infect humans.  If clinically indicated additional testing with an alternate test  methodology [phone]) is advised. The SARS-CoV-2 RNA is generally  detectable in upper and lower respiratory sp ecimens during the acute  phase of infection. The expected result is Negative. Fact Sheet for Patients:  StrictlyIdeas.no Fact Sheet for Healthcare Providers: BankingDealers.co.za This test is not yet approved or cleared by the [place] FDA and has been authorized for detection and/or diagnosis of SARS-CoV-2 by FDA under an Emergency Use Authorization (EUA).  This EUA will remain in effect (meaning this test can be used) for the duration of the COVID-19 declaration under Section 564(b)(1) of the Act, 21 U.S.C. section 360bbb-3(b)(1), unless the authorization  is terminated or revoked sooner. Performed at [facility], [place] [address], [place], [place] [address]   Culture, blood (routine x 2)     Status: None (Preliminary result)   Collection Time: 01/21/19  7:50 PM  Result Value Ref Range Status   Specimen Description BLOOD LEFT ANTECUBITAL  Final   Special Requests   Final    BOTTLES DRAWN AEROBIC AND ANAEROBIC Blood Culture adequate volume   Culture   Final    NO GROWTH 2 DAYS Performed at [facility], [place] [address], [place], [place] [address]    Report Status PENDING  Incomplete  Culture, blood (routine x 2)     Status: None (Preliminary result)   Collection Time: 01/21/19  8:03 PM  Result Value Ref Range Status   Specimen Description BLOOD LEFT WRIST  Final   Special Requests   Final    BOTTLES DRAWN AEROBIC AND ANAEROBIC Blood Culture results may not be  optimal due to an inadequate volume of blood received in culture bottles   Culture   Final    NO GROWTH 2 DAYS Performed at [facility], [place] [address] [place], [place] [address]    Report Status PENDING  Incomplete  MRSA PCR Screening     Status: None   Collection Time: 01/21/19 10:40 PM  Result Value Ref Range Status   MRSA by PCR NEGATIVE NEGATIVE Final    Comment:        The GeneXpert MRSA Assay (FDA approved for NASAL specimens only), is one component of a comprehensive MRSA colonization surveillance program. It is not intended to diagnose MRSA infection nor to guide or monitor treatment for MRSA infections. Performed at [facility], [place] [address] [place], [place] [address]      Studies: Dg Abd 1 View  Result Date: 01/22/2019 CLINICAL DATA:  Abdominal pain with nausea and vomiting EXAM: ABDOMEN - 1 VIEW COMPARISON:  October 22, 2017 FINDINGS: There is mild stool in the colon. There is no bowel dilatation or air-fluid level to suggest bowel obstruction. No free air. There is a probable phlebolith in the left pelvis, stable. IMPRESSION: No bowel obstruction or free air evident. Electronically Signed   By: Lowella Grip III M.D.   On: 01/22/2019 15:37    Scheduled Meds: . sodium chloride   Intravenous Once  . cloNIDine  0.1 mg Oral QID   Followed by  . [START ON 01/24/2019] cloNIDine  0.1 mg Oral BH-qamhs   Followed by  . [START ON 01/27/2019] cloNIDine  0.1 mg Oral QAC breakfast  . famotidine  20 mg Oral Daily  . FLUoxetine  40 mg Oral Daily  . folic acid  1 mg Oral Daily  . gabapentin  600 mg Oral BID  . haloperidol  2 mg Oral QHS  . lactulose  20 g Oral TID  . lidocaine-EPINEPHrine  10 mL Infiltration Once  . multivitamin with minerals  1 tablet Oral Daily  . naltrexone  50 mg Oral Daily  . ondansetron  4 mg Intravenous Once  . phenytoin  300 mg Oral Daily  . polyethylene glycol  17 g Oral Daily  . senna-docusate  1 tablet Oral BID  .  thiamine  100 mg Oral Daily   Or  . thiamine  100 mg Intravenous Daily  . topiramate  50 mg Oral Daily  . traZODone  100 mg Oral QHS    Continuous Infusions: . lactated ringers 75 mL/hr  at 01/22/19 1702     Time spent: 68mins I have personally reviewed and interpreted on  01/23/2019 daily labs, tele strips, imagings as discussed above under date review session and assessment and plans.  I reviewed all nursing notes, pharmacy notes,  vitals, pertinent old records  I have discussed plan of care as described above with RN , patient  on 01/23/2019   Florencia Reasons MD, PhD  [facility] Pager [phone]. If 7PM-7AM, please contact night-coverage at [email or web address], password [facility] 01/23/2019, 2:18 PM  LOS: 3 days

## 2019-01-23 NOTE — Progress Notes (Addendum)
Patient observed by NT taking 1 white pill of own medication from her purse. RN observed 1 can of natural light beer in purse, un-opened.

## 2019-01-23 NOTE — Plan of Care (Signed)
progressing 

## 2019-01-24 ENCOUNTER — Inpatient Hospital Stay (HOSPITAL_COMMUNITY): Payer: Medicaid Other

## 2019-01-24 LAB — BPAM PLATELET PHERESIS
Blood Product Expiration Date: 202005162359
Blood Product Expiration Date: 202005162359
Blood Product Expiration Date: 202005172359
Blood Product Expiration Date: 202005172359
Blood Product Expiration Date: 202005172359
ISSUE DATE / TIME: 202005151007
ISSUE DATE / TIME: 202005151600
ISSUE DATE / TIME: 202005151638
ISSUE DATE / TIME: 202005160645
ISSUE DATE / TIME: 202005161238
Unit Type and Rh: 600
Unit Type and Rh: 600
Unit Type and Rh: 6200
Unit Type and Rh: 6200
Unit Type and Rh: 7300

## 2019-01-24 LAB — CBC WITH DIFFERENTIAL/PLATELET
Abs Immature Granulocytes: 0 10*3/uL (ref 0.00–0.07)
Basophils Absolute: 0 10*3/uL (ref 0.0–0.1)
Basophils Relative: 1 %
Eosinophils Absolute: 0.1 10*3/uL (ref 0.0–0.5)
Eosinophils Relative: 5 %
HCT: 35.9 % — ABNORMAL LOW (ref 36.0–46.0)
Hemoglobin: 12 g/dL (ref 12.0–15.0)
Immature Granulocytes: 0 %
Lymphocytes Relative: 32 %
Lymphs Abs: 0.9 10*3/uL (ref 0.7–4.0)
MCH: 31.5 pg (ref 26.0–34.0)
MCHC: 33.4 g/dL (ref 30.0–36.0)
MCV: 94.2 fL (ref 80.0–100.0)
Monocytes Absolute: 0.4 10*3/uL (ref 0.1–1.0)
Monocytes Relative: 13 %
Neutro Abs: 1.3 10*3/uL — ABNORMAL LOW (ref 1.7–7.7)
Neutrophils Relative %: 49 %
Platelets: 19 10*3/uL — CL (ref 150–400)
RBC: 3.81 MIL/uL — ABNORMAL LOW (ref 3.87–5.11)
RDW: 16.9 % — ABNORMAL HIGH (ref 11.5–15.5)
WBC: 2.7 10*3/uL — ABNORMAL LOW (ref 4.0–10.5)
nRBC: 0 % (ref 0.0–0.2)

## 2019-01-24 LAB — PREPARE PLATELET PHERESIS
Unit division: 0
Unit division: 0
Unit division: 0
Unit division: 0
Unit division: 0

## 2019-01-24 LAB — COMPREHENSIVE METABOLIC PANEL
ALT: 23 U/L (ref 0–44)
AST: 48 U/L — ABNORMAL HIGH (ref 15–41)
Albumin: 2.8 g/dL — ABNORMAL LOW (ref 3.5–5.0)
Alkaline Phosphatase: 127 U/L — ABNORMAL HIGH (ref 38–126)
Anion gap: 12 (ref 5–15)
BUN: <5 mg/dL — ABNORMAL LOW (ref 6–20)
CO2: 20 mmol/L — ABNORMAL LOW (ref 22–32)
Calcium: 8.9 mg/dL (ref 8.9–10.3)
Chloride: 104 mmol/L (ref 98–111)
Creatinine, Ser: 0.54 mg/dL (ref 0.44–1.00)
GFR calc Af Amer: >60 mL/min (ref >60)
GFR calc non Af Amer: >60 mL/min (ref >60)
Glucose, Bld: 94 mg/dL (ref 70–99)
Potassium: 3.6 mmol/L (ref 3.5–5.1)
Sodium: 136 mmol/L (ref 135–145)
Total Bilirubin: 1.5 mg/dL — ABNORMAL HIGH (ref 0.3–1.2)
Total Protein: 6.9 g/dL (ref 6.5–8.1)

## 2019-01-24 LAB — MAGNESIUM: Magnesium: 1.6 mg/dL — ABNORMAL LOW (ref 1.7–2.4)

## 2019-01-24 LAB — PROTIME-INR
INR: 1.3 — ABNORMAL HIGH (ref 0.8–1.2)
Prothrombin Time: 15.8 seconds — ABNORMAL HIGH (ref 11.4–15.2)

## 2019-01-24 LAB — AMMONIA: Ammonia: 73 umol/L — ABNORMAL HIGH (ref 9–35)

## 2019-01-24 MED ORDER — MAGNESIUM SULFATE 2 GM/50ML IV SOLN
2.0000 g | Freq: Once | INTRAVENOUS | Status: AC
  Administered 2019-01-24: 2 g via INTRAVENOUS
  Filled 2019-01-24: qty 50

## 2019-01-24 NOTE — Progress Notes (Addendum)
PROGRESS NOTE  Briana Sweeney XFG:[id number] DOB: [birth year] DOA: 01/20/2019 PCP: [name], FNP  Brief summary:  Alcoholic, presented with Nasal bleed and fall, thrombocytopenia.    HPI/Recap of past 24 hours:  She is improving, less tremors,  She is calm and cooperative, no n/v No active bleed Right sided headache due to scalp hematoma secondary to fall prior to coming to the hospital she is oriented x3,   No fever  She reports lower abdominal pain and back pain which is chronic   Assessment/Plan: Principal Problem:   Fall Active Problems:   Decompensated hepatic cirrhosis (HCC)   Alcohol abuse   Hepatitis C antibody positive in blood   Schizophrenia (HCC)   GERD (gastroesophageal reflux disease)   Thrombocytopenia (HCC)   Polysubstance abuse (HCC)   Alcoholic intoxication without complication (HCC)   Epistaxis   Head injury   Scalp laceration, initial encounter  Alcohol intoxication with Fall and epistaxis on presentation, alcohol withdrawal CT head on admission no intracranial hemorrhage, repeat ct head no intracranial hemorrhage  ciwa protocal, and  clonidine taper improving   N/v/ab pain  lipase and   kub no acute findings improving, advance diet as tolerated    Thrombocytopenia with intermittent epistaxis, right scalp hematoma plt 16 on presentation,  s/p plt transfusion on 5/14, plt remain low,  another plt on 5/15 Monitor, transfuse is plt less than 10 or active bleed  Alcohol cirrhosis With h/o ascites required paracentesis a year ago Possible mild distension on exam but soft,  ab [place] to evaluate ascites , though need to transfuse plt if needing paracentesis,  can consider low dose diuretics if + ascites, currently ab exam is benign Elevated ammonia level on lactulose  Seizure Continue dilantin   H/o hep c , unclear detail  Polysubstance abuse Per RN "Patient observed by NT taking 1 white pill of own medication from her purse. RN  observed 1 can of natural light beer in purse, un-opened."  Repeat uds on 5/16 + for benzo ( she is getting ativan for alcohol withdrawal)    Recurrent hospitalizations 12th hospitalization since 09/2017 at [place]  Poor prognosis  Code Status: full  Family Communication: patient , sister [name] over the phone with permission    Disposition Plan: home in 1-2 days pending plt, ab [place], withdrawal symptom improvement, clonidine taper  Over all improving, d/c tele, d/c sitter today Consultants:  none  Procedures:  none  Antibiotics:  none   Objective: BP 102/69 (BP Location: Right Arm)   Pulse 79   Temp 97.7 F (36.5 C) (Oral)   Resp 17   Wt 57.3 kg   SpO2 96%   BMI 24.67 kg/m   Intake/Output Summary (Last 24 hours) at 01/24/2019 1008 Last data filed at 01/23/2019 2045 Gross per 24 hour  Intake 225 ml  Output 1300 ml  Net -1075 ml   Filed Weights   01/20/19 2321  Weight: 57.3 kg    Exam: Patient is examined daily including today on 01/24/2019, exams remain the same as of yesterday except that has changed    General:  Aaox3, less tremor  Cardiovascular: sinus tachycardia has resolved  Respiratory: CTABL  Abdomen: mild distension? Soft/ND/NT, positive BS  Musculoskeletal: No Edema  Neuro: alert, oriented   Data Reviewed: Basic Metabolic Panel: Recent Labs  Lab 01/20/19 1942 01/21/19 0322 01/22/19 0354 01/23/19 0352 01/24/19 0453  NA 141 144 132* 134* 136  K 3.8 3.9 3.7 3.3* 3.6  CL 107  108 100 101 104  CO2 18* 21* 24 22 20*  GLUCOSE 126* 90 113* 110* 94  BUN 6 5* <5* <5* <5*  CREATININE 0.66 0.59 0.65 0.54 0.54  CALCIUM 8.3* 7.8* 8.9 9.0 8.9  MG  --   --  1.8 1.8 1.6*   Liver Function Tests: Recent Labs  Lab 01/21/19 0322 01/22/19 0354 01/23/19 0352 01/24/19 0453  AST 91* 92* 73* 48*  ALT 31 29 28 23   ALKPHOS 174* 175* 140* 127*  BILITOT 0.9 2.9* 2.6* 1.5*  PROT 7.9 7.8 7.9 6.9  ALBUMIN 3.2* 3.1* 3.0* 2.8*   Recent Labs   Lab 01/23/19 0352  LIPASE 47   Recent Labs  Lab 01/23/19 0352 01/24/19 0453  AMMONIA 71* 73*   CBC: Recent Labs  Lab 01/20/19 1942 01/21/19 0322 01/22/19 0354 01/23/19 0352 01/24/19 0453  WBC 3.0* 5.4 2.2* 4.8 2.7*  NEUTROABS 1.0*  --  0.9* 3.7 1.3*  HGB 14.3 12.9 12.9 12.3 12.0  HCT 42.8 38.7 37.5 35.6* 35.9*  MCV 94.1 93.7 91.9 92.5 94.2  PLT 16* 30* 11* 16* 19*   Cardiac Enzymes:   No results for input(s): CKTOTAL, CKMB, CKMBINDEX, TROPONINI in the last 168 hours. BNP (last 3 results) No results for input(s): BNP in the last 8760 hours.  ProBNP (last 3 results) No results for input(s): PROBNP in the last 8760 hours.  CBG: No results for input(s): GLUCAP in the last 168 hours.  Recent Results (from the past 240 hour(s))  SARS Coronavirus 2 (CEPHEID - Performed in [facility] hospital lab), Hosp Order     Status: None   Collection Time: 01/20/19 10:44 PM  Result Value Ref Range Status   SARS Coronavirus 2 NEGATIVE NEGATIVE Final    Comment: (NOTE) If result is NEGATIVE SARS-CoV-2 target nucleic acids are NOT DETECTED. The SARS-CoV-2 RNA is generally detectable in upper and lower  respiratory specimens during the acute phase of infection. The lowest  concentration of SARS-CoV-2 viral copies this assay can detect is 250  copies / mL. A negative result does not preclude SARS-CoV-2 infection  and should not be used as the sole basis for treatment or other  patient management decisions.  A negative result may occur with  improper specimen collection / handling, submission of specimen other  than nasopharyngeal swab, presence of viral mutation(s) within the  areas targeted by this assay, and inadequate number of viral copies  (<250 copies / mL). A negative result must be combined with clinical  observations, patient history, and epidemiological information. If result is POSITIVE SARS-CoV-2 target nucleic acids are DETECTED. The SARS-CoV-2 RNA is generally  detectable in upper and lower  respiratory specimens dur ing the acute phase of infection.  Positive  results are indicative of active infection with SARS-CoV-2.  Clinical  correlation with patient history and other diagnostic information is  necessary to determine patient infection status.  Positive results do  not rule out bacterial infection or co-infection with other viruses. If result is PRESUMPTIVE POSTIVE SARS-CoV-2 nucleic acids MAY BE PRESENT.   A presumptive positive result was obtained on the submitted specimen  and confirmed on repeat testing.  While 2019 novel coronavirus  (SARS-CoV-2) nucleic acids may be present in the submitted sample  additional confirmatory testing may be necessary for epidemiological  and / or clinical management purposes  to differentiate between  SARS-CoV-2 and other Sarbecovirus currently known to infect humans.  If clinically indicated additional testing with an alternate test  methodology (  [id number]) is advised. The SARS-CoV-2 RNA is generally  detectable in upper and lower respiratory sp ecimens during the acute  phase of infection. The expected result is Negative. Fact Sheet for Patients:  StrictlyIdeas.no Fact Sheet for Healthcare Providers: BankingDealers.co.za This test is not yet approved or cleared by the [place] FDA and has been authorized for detection and/or diagnosis of SARS-CoV-2 by FDA under an Emergency Use Authorization (EUA).  This EUA will remain in effect (meaning this test can be used) for the duration of the COVID-19 declaration under Section 564(b)(1) of the Act, 21 U.S.C. section 360bbb-3(b)(1), unless the authorization is terminated or revoked sooner. Performed at [facility], [place] [address] [place], [place] [address]   Culture, blood (routine x 2)     Status: None (Preliminary result)   Collection Time: 01/21/19  7:50 PM  Result Value Ref Range Status    Specimen Description BLOOD LEFT ANTECUBITAL  Final   Special Requests   Final    BOTTLES DRAWN AEROBIC AND ANAEROBIC Blood Culture adequate volume   Culture   Final    NO GROWTH 2 DAYS Performed at [facility], [place] [address] [place], [place] [address]    Report Status PENDING  Incomplete  Culture, blood (routine x 2)     Status: None (Preliminary result)   Collection Time: 01/21/19  8:03 PM  Result Value Ref Range Status   Specimen Description BLOOD LEFT WRIST  Final   Special Requests   Final    BOTTLES DRAWN AEROBIC AND ANAEROBIC Blood Culture results may not be optimal due to an inadequate volume of blood received in culture bottles   Culture   Final    NO GROWTH 2 DAYS Performed at [facility], [place] [address], [place], [place] [address]    Report Status PENDING  Incomplete  MRSA PCR Screening     Status: None   Collection Time: 01/21/19 10:40 PM  Result Value Ref Range Status   MRSA by PCR NEGATIVE NEGATIVE Final    Comment:        The GeneXpert MRSA Assay (FDA approved for NASAL specimens only), is one component of a comprehensive MRSA colonization surveillance program. It is not intended to diagnose MRSA infection nor to guide or monitor treatment for MRSA infections. Performed at [facility], [place] [address], [place], [place] [address]      Studies: No results found.  Scheduled Meds: . sodium chloride   Intravenous Once  . cloNIDine  0.1 mg Oral QID   Followed by  . cloNIDine  0.1 mg Oral BH-qamhs   Followed by  . [START ON 01/27/2019] cloNIDine  0.1 mg Oral QAC breakfast  . famotidine  20 mg Oral Daily  . FLUoxetine  40 mg Oral Daily  . folic acid  1 mg Oral Daily  . gabapentin  600 mg Oral BID  . haloperidol  2 mg Oral QHS  . lactulose  20 g Oral TID  . lidocaine-EPINEPHrine  10 mL Infiltration Once  . multivitamin with minerals  1 tablet Oral Daily  . naltrexone  50 mg Oral Daily  . ondansetron  4 mg Intravenous Once  . phenytoin   300 mg Oral Daily  . polyethylene glycol  17 g Oral Daily  . senna-docusate  1 tablet Oral BID  . thiamine  100 mg Oral Daily   Or  . thiamine  100 mg Intravenous Daily  . topiramate  50 mg  Oral Daily  . traZODone  100 mg Oral QHS    Continuous Infusions:    Time spent: 97mins I have personally reviewed and interpreted on  01/24/2019 daily labs, tele strips, imagings as discussed above under date review session and assessment and plans.  I reviewed all nursing notes, pharmacy notes,  vitals, pertinent old records  I have discussed plan of care as described above with RN , patient  on 01/24/2019   Florencia Reasons MD, PhD  [facility] Pager [phone]. If 7PM-7AM, please contact night-coverage at [email or web address], password [facility] 01/24/2019, 10:08 AM  LOS: 4 days

## 2019-01-25 LAB — COMPREHENSIVE METABOLIC PANEL
ALT: 21 U/L (ref 0–44)
AST: 38 U/L (ref 15–41)
Albumin: 2.6 g/dL — ABNORMAL LOW (ref 3.5–5.0)
Alkaline Phosphatase: 128 U/L — ABNORMAL HIGH (ref 38–126)
Anion gap: 7 (ref 5–15)
BUN: 5 mg/dL — ABNORMAL LOW (ref 6–20)
CO2: 20 mmol/L — ABNORMAL LOW (ref 22–32)
Calcium: 8.8 mg/dL — ABNORMAL LOW (ref 8.9–10.3)
Chloride: 106 mmol/L (ref 98–111)
Creatinine, Ser: 0.58 mg/dL (ref 0.44–1.00)
GFR calc Af Amer: >60 mL/min (ref >60)
GFR calc non Af Amer: >60 mL/min (ref >60)
Glucose, Bld: 142 mg/dL — ABNORMAL HIGH (ref 70–99)
Potassium: 3.5 mmol/L (ref 3.5–5.1)
Sodium: 133 mmol/L — ABNORMAL LOW (ref 135–145)
Total Bilirubin: 0.9 mg/dL (ref 0.3–1.2)
Total Protein: 7 g/dL (ref 6.5–8.1)

## 2019-01-25 LAB — CBC WITH DIFFERENTIAL/PLATELET
Abs Immature Granulocytes: 0.01 10*3/uL (ref 0.00–0.07)
Basophils Absolute: 0 10*3/uL (ref 0.0–0.1)
Basophils Relative: 1 %
Eosinophils Absolute: 0.2 10*3/uL (ref 0.0–0.5)
Eosinophils Relative: 5 %
HCT: 36.7 % (ref 36.0–46.0)
Hemoglobin: 12.3 g/dL (ref 12.0–15.0)
Immature Granulocytes: 0 %
Lymphocytes Relative: 35 %
Lymphs Abs: 1 10*3/uL (ref 0.7–4.0)
MCH: 32 pg (ref 26.0–34.0)
MCHC: 33.5 g/dL (ref 30.0–36.0)
MCV: 95.6 fL (ref 80.0–100.0)
Monocytes Absolute: 0.6 10*3/uL (ref 0.1–1.0)
Monocytes Relative: 22 %
Neutro Abs: 1.1 10*3/uL — ABNORMAL LOW (ref 1.7–7.7)
Neutrophils Relative %: 37 %
Platelets: 18 10*3/uL — CL (ref 150–400)
RBC: 3.84 MIL/uL — ABNORMAL LOW (ref 3.87–5.11)
RDW: 17.2 % — ABNORMAL HIGH (ref 11.5–15.5)
WBC: 2.9 10*3/uL — ABNORMAL LOW (ref 4.0–10.5)
nRBC: 0 % (ref 0.0–0.2)

## 2019-01-25 LAB — MAGNESIUM: Magnesium: 1.7 mg/dL (ref 1.7–2.4)

## 2019-01-25 NOTE — Progress Notes (Signed)
PROGRESS NOTE  Briana Sweeney MWU:[id number] DOB: 05-22-1971 DOA: 01/20/2019 PCP: [name], FNP   LOS: 5 days   Brief narrative: Briana Sweeney is a 48 y.o. female with medical history significant of polysubstance abuse especially alcohol abuse, liver cirrhosis with hep C and alcoholic causes, chronic pancytopenia, bipolar disorder with anxiety features who was brought in secondary to fall.  Patient has continued to drink alcohol despite her advanced liver disease.  She apparently has been having epistaxis all week long.  Was seen in the ER having profuse epistaxis tonight.  She was apparently at a friend's house after drinking excessively when she fell and hit her head.  Patient lost some consciousness briefly.  She had initial work-up shows no evidence of intracranial bleed but she was having epistaxis and very low platelets.  She is awake and communicating but a little drowsy.  She is still feeling dizzy and nauseated when she tries to get up.  She is helped frequent transfusions the last was in April.  Patient is being admitted therefore with severe thrombocytopenia, fall and ongoing epistaxis..  ED Course: Temperature is 98 blood pressure 93/58 pulse 102 respiratory rate of 18 oxygen sat 95% room air.  She has a white count of 3.0 hemoglobin is 14.3 platelets 16,000.  Chemistry showed a CO2 of 18 creatinine 0.  6 6 and calcium 8.3.  Glucose 126.  Alcohol level is 436.  CT head without contrast CT cervical spine showed no acute findings.  COVID-19 screen is negative.  Patient was admitted with severe thrombocytopenia epistaxis and a fall.  Subjective: Patient was seen and examined this morning.  Middle-aged Caucasian female.  Lying in bed.  Not in distress.  No active bleeding.  Felt tired and did not work with physical therapy yesterday.  Assessment/Plan:  Principal Problem:   Fall Active Problems:   Decompensated hepatic cirrhosis (HCC)   Alcohol abuse   Hepatitis C antibody  positive in blood   Schizophrenia (HCC)   GERD (gastroesophageal reflux disease)   Thrombocytopenia (HCC)   Polysubstance abuse (HCC)   Alcoholic intoxication without complication (HCC)   Epistaxis   Head injury   Scalp laceration, initial encounter   Alcohol intoxication with Fall and epistaxis on presentation, alcohol withdrawal CT head on admission no intracranial hemorrhage, repeat ct head no intracranial hemorrhage  ciwa protocal, and  clonidine taper improving  N/v/ab pain  lipase and  kub no acute findings improving, advance diet as tolerated  Thrombocytopenia with intermittent epistaxis, right scalp hematoma plt 16 on presentation,  s/p plt transfusion on 5/14, plt remain low,  another plt on 5/15 Monitor, transfuse is plt less than 10 or active bleed  Alcohol cirrhosis With h/o ascites required paracentesis a year ago.  Underwent ultrasound abdomen 5/17, no ascites noted. Elevated ammonia level on lactulose  Seizure Continue dilantin   H/o hep c , unclear detail  Polysubstance abuse  Body mass index is 24.67 kg/m. Mobility: Limited.  Unwilling to ambulate because of dizziness.  Encouraged to try today. Diet: Low-sodium diet DVT prophylaxis:  SCDs Code Status:   Code Status: Full Code  Disposition Plan:  Pending PT evaluation.  Consultants:  GI  Antimicrobials: None Anti-infectives (From admission, onward)   None      Infusions: None   Scheduled Meds: . sodium chloride   Intravenous Once  . cloNIDine  0.1 mg Oral BH-qamhs   Followed by  . [START ON 01/27/2019] cloNIDine  0.1 mg Oral QAC breakfast  .  famotidine  20 mg Oral Daily  . FLUoxetine  40 mg Oral Daily  . folic acid  1 mg Oral Daily  . gabapentin  600 mg Oral BID  . haloperidol  2 mg Oral QHS  . lactulose  20 g Oral TID  . lidocaine-EPINEPHrine  10 mL Infiltration Once  . multivitamin with minerals  1 tablet Oral Daily  . naltrexone  50 mg Oral Daily  . ondansetron  4 mg  Intravenous Once  . phenytoin  300 mg Oral Daily  . polyethylene glycol  17 g Oral Daily  . senna-docusate  1 tablet Oral BID  . thiamine  100 mg Oral Daily   Or  . thiamine  100 mg Intravenous Daily  . topiramate  50 mg Oral Daily  . traZODone  100 mg Oral QHS    PRN meds: dicyclomine, hydrOXYzine, hydrOXYzine, loperamide, LORazepam **OR** LORazepam, methocarbamol, ondansetron **OR** ondansetron (ZOFRAN) IV, ondansetron   Objective: Vitals:   01/25/19 0928 01/25/19 1249  BP: 109/77 91/61  Pulse: 61 72  Resp: 18 18  Temp: 98.1 F (36.7 C) 97.7 F (36.5 C)  SpO2: 100% 97%   No intake or output data in the 24 hours ending 01/25/19 1408 Filed Weights   01/20/19 2321  Weight: 57.3 kg   Weight change:  Body mass index is 24.67 kg/m.   Physical Exam: General exam: Appears calm and comfortable.  Depressed look Skin: No rashes, lesions or ulcers. HEENT: Atraumatic, normocephalic, supple neck, no obvious bleeding Lungs: Clear to auscultation bilaterally CVS: Regular rate and rhythm, no murmur GI/Abd soft, nondistended, nontender, bowel sound present CNS: Alert, awake, oriented x3, no tremors Psychiatry: Mood & affect appropriate.  Extremities: No pedal edema, no calf tenderness   Data Review: I have personally reviewed the laboratory data and studies available.  Recent Labs  Lab 01/20/19 1942 01/21/19 0322 01/22/19 0354 01/23/19 0352 01/24/19 0453 01/25/19 0542  WBC 3.0* 5.4 2.2* 4.8 2.7* 2.9*  NEUTROABS 1.0*  --  0.9* 3.7 1.3* 1.1*  HGB 14.3 12.9 12.9 12.3 12.0 12.3  HCT 42.8 38.7 37.5 35.6* 35.9* 36.7  MCV 94.1 93.7 91.9 92.5 94.2 95.6  PLT 16* 30* 11* 16* 19* 18*   Recent Labs  Lab 01/21/19 0322 01/22/19 0354 01/23/19 0352 01/24/19 0453 01/25/19 0542  NA 144 132* 134* 136 133*  K 3.9 3.7 3.3* 3.6 3.5  CL 108 100 101 104 106  CO2 21* 24 22 20* 20*  GLUCOSE 90 113* 110* 94 142*  BUN 5* <5* <5* <5* 5*  CREATININE 0.59 0.65 0.54 0.54 0.58  CALCIUM  7.8* 8.9 9.0 8.9 8.8*  MG  --  1.8 1.8 1.6* 1.7    Terrilee Croak, MD  [facility] 01/25/2019

## 2019-01-25 NOTE — Evaluation (Signed)
Physical Therapy Evaluation Patient Details Name: Briana Sweeney MRN: [id number] DOB: 19-Feb-1971 Today's Date: 01/25/2019   History of Present Illness  Patient is a 48 y/o female presenting to the ED on 01/20/2019 with primary complaints of nasal bleed and fall. CT head without contrast CT cervical spine showed no acute findings. 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    Clinical Impression  Patient admitted with the above listed diagnosis. Patient reports IND with mobility and ADLs prior to admission. Patient today requiring 1 HHA with min guard throughout mobility for safety and stability. Patient ambulating in hallway and up/down 2 steps with min guard due to mild instability. Will currently recommend HHPT - may progress away from this. PT to continue to follow.      Follow Up Recommendations Home health PT;Supervision for mobility/OOB(will assess with progress)    Equipment Recommendations  None recommended by PT    Recommendations for Other Services       Precautions / Restrictions Precautions Precautions: Fall Restrictions Weight Bearing Restrictions: No      Mobility  Bed Mobility Overal bed mobility: Modified Independent                Transfers Overall transfer level: Needs assistance Equipment used: 1 person hand held assist Transfers: Sit to/from Stand;Stand Pivot Transfers Sit to Stand: Min guard Stand pivot transfers: Min guard       General transfer comment: for safety  Ambulation/Gait Ambulation/Gait assistance: Min guard Gait Distance (Feet): 200 Feet Assistive device: 1 person hand held assist Gait Pattern/deviations: Step-through pattern;Decreased stride length;Trunk flexed Gait velocity: decreased   General Gait Details: unsteady gait pattern - patient reporting lights hurting her  eyes  Stairs Stairs: Yes Stairs assistance: Min guard Stair Management: Two rails;Alternating pattern;Step to pattern;Forwards Number of Stairs: 2(x2)    Wheelchair Mobility    Modified Rankin (Stroke Patients Only)       Balance Overall balance assessment: Needs assistance Sitting-balance support: No upper extremity supported;Feet supported Sitting balance-Leahy Scale: Fair     Standing balance support: Single extremity supported;During functional activity Standing balance-Leahy Scale: Poor                               Pertinent Vitals/Pain Pain Assessment: Faces Faces Pain Scale: Hurts even more Pain Location: L side of head - initially states "Right side" but holds onto L side Pain Descriptors / Indicators: Aching;Discomfort;Grimacing;Guarding Pain Intervention(s): Limited activity within patient's tolerance;Monitored during session;Repositioned    Home Living Family/patient expects to be discharged to:: Private residence Living Arrangements: Spouse/significant other Available Help at Discharge: Available PRN/intermittently;Friend(s) Type of Home: House Home Access: Stairs to enter   [employer] of Steps: states "a lot but I can stay downstairs and do not have to do stairs there"  Home Layout: Full bath on main level Home Equipment: None      Prior Function Level of Independence: Independent               Hand Dominance        Extremity/Trunk Assessment   Upper Extremity Assessment Upper Extremity Assessment: Defer to OT evaluation    Lower Extremity Assessment Lower Extremity Assessment: Generalized weakness    Cervical / Trunk Assessment Cervical / Trunk Assessment: Normal  Communication   Communication: No difficulties  Cognition Arousal/Alertness: Awake/alert Behavior During Therapy:  WFL for tasks assessed/performed Overall Cognitive Status: No family/caregiver present to determine baseline cognitive  functioning                                 General Comments: some slow processing - unsure if this is at baseline      General Comments      Exercises     Assessment/Plan    PT Assessment Patient needs continued PT services  PT Problem List Decreased strength;Decreased activity tolerance;Decreased balance;Decreased mobility;Decreased knowledge of use of DME;Decreased knowledge of precautions;Decreased safety awareness       PT Treatment Interventions DME instruction;Gait training;Stair training;Functional mobility training;Therapeutic activities;Therapeutic exercise;Balance training;Patient/family education    PT Goals (Current goals can be found in the Care Plan section)  Acute Rehab PT Goals Patient Stated Goal: return home soon PT Goal Formulation: With patient Time For Goal Achievement: 02/08/19 Potential to Achieve Goals: Good    Frequency Min 3X/week   Barriers to discharge        Co-evaluation               AM-PAC PT "6 Clicks" Mobility  Outcome Measure Help needed turning from your back to your side while in a flat bed without using bedrails?: None Help needed moving from lying on your back to sitting on the side of a flat bed without using bedrails?: None Help needed moving to and from a bed to a chair (including a wheelchair)?: A Little Help needed standing up from a chair using your arms (e.g., wheelchair or bedside chair)?: A Little Help needed to walk in hospital room?: A Little Help needed climbing 3-5 steps with a railing? : A Little 6 Click Score: 20    End of Session Equipment Utilized During Treatment: Gait belt Activity Tolerance: Patient tolerated treatment well Patient left: in bed;with call bell/phone within reach;with bed alarm set Nurse Communication: Mobility status PT Visit Diagnosis: Unsteadiness on feet (R26.81);Other abnormalities of gait and mobility (R26.89);Muscle weakness (generalized) (M62.81)    Time:  6962-9528 PT Time Calculation (min) (ACUTE ONLY): 22 min   Charges:   PT Evaluation $PT Eval Moderate Complexity: 1 Mod          Lanney Gins, PT, DPT Supplemental Physical Therapist 01/25/19 3:25 PM Pager: [phone] Office: [phone]

## 2019-01-26 LAB — CULTURE, BLOOD (ROUTINE X 2)
Culture: NO GROWTH
Culture: NO GROWTH
Special Requests: ADEQUATE

## 2019-01-26 MED ORDER — THIAMINE HCL 100 MG PO TABS: 100.0000 mg | ORAL_TABLET | Freq: Every day | ORAL | 0 refills | Status: AC

## 2019-01-26 MED ORDER — LORAZEPAM 1 MG PO TABS: 1.0000 mg | ORAL_TABLET | Freq: Three times a day (TID) | ORAL | 0 refills | Status: AC | PRN

## 2019-01-26 MED ORDER — LACTULOSE 10 GM/15ML PO SOLN: 10.0000 g | Freq: Three times a day (TID) | ORAL | 0 refills | Status: AC

## 2019-01-26 NOTE — Progress Notes (Signed)
PT Cancellation Note  Patient Details Name: Briana Sweeney MRN: [id number] DOB: 06/06/1971   Cancelled Treatment:    Reason Eval/Treat Not Completed: Pain limiting ability to participate Pt refusing to participate secondary to LLE pain. Will follow up as schedule allows. ]  Leighton Ruff, PT, DPT  Acute Rehabilitation Services  Pager: [phone] Office: [phone]    Rudean Hitt 01/26/2019, 12:01 PM

## 2019-01-26 NOTE — TOC Transition Note (Signed)
Transition of Care [facility]) - CM/SW Discharge Note   Patient Details  Name: Briana Sweeney MRN: [id number] Date of Birth: 1971-06-21  Transition of Care [facility]) CM/SW Contact:  Geralynn Ochs, LCSW Phone Number: 01/26/2019, 3:59 PM   Clinical Narrative:  CSW met with patient to discuss recommendation for home health PT. Patient refusing at this time. No further needs.     Final next level of care: Home/Self Care Barriers to Discharge: No Barriers Identified   Patient Goals and CMS Choice Patient states their goals for this hospitalization and ongoing recovery are:: feel better and go home      Discharge Placement                       Discharge Plan and Services                          HH Arranged: Refused HH          Social Determinants of Health (SDOH) Interventions     Readmission Risk Interventions No flowsheet data found.

## 2019-01-26 NOTE — Plan of Care (Signed)
  Problem: Education: Goal: Knowledge of General Education information will improve Description Including pain rating scale, medication(s)/side effects and non-pharmacologic comfort measures Outcome: Progressing   Problem: Health Behavior/Discharge Planning: Goal: Ability to manage health-related needs will improve Outcome: Progressing   Problem: Clinical Measurements: Goal: Ability to maintain clinical measurements within normal limits will improve Outcome: Progressing Goal: Will remain free from infection Outcome: Progressing Goal: Diagnostic test results will improve Outcome: Progressing Goal: Respiratory complications will improve Outcome: Progressing Goal: Cardiovascular complication will be avoided Outcome: Progressing   Problem: Activity: Goal: Risk for activity intolerance will decrease Outcome: Progressing   Problem: Nutrition: Goal: Adequate nutrition will be maintained Outcome: Progressing   Problem: Elimination: Goal: Will not experience complications related to bowel motility Outcome: Progressing Goal: Will not experience complications related to urinary retention Outcome: Progressing   Problem: Pain Managment: Goal: General experience of comfort will improve Outcome: Progressing   Problem: Safety: Goal: Ability to remain free from injury will improve Outcome: Progressing   Problem: Skin Integrity: Goal: Risk for impaired skin integrity will decrease Outcome: Progressing   Problem: Education: Goal: Knowledge of disease or condition will improve Outcome: Progressing Goal: Understanding of discharge needs will improve Outcome: Progressing   Problem: Health Behavior/Discharge Planning: Goal: Ability to identify changes in lifestyle to reduce recurrence of condition will improve Outcome: Progressing Goal: Identification of resources available to assist in meeting health care needs will improve Outcome: Progressing   Problem: Physical  Regulation: Goal: Complications related to the disease process, condition or treatment will be avoided or minimized Outcome: Progressing   Problem: Safety: Goal: Ability to remain free from injury will improve Outcome: Progressing   Ival Bible, BSN, RN

## 2019-01-26 NOTE — Discharge Summary (Signed)
Physician Discharge Summary  Briana Sweeney WVP:[id number] DOB: Sep 14, 1970 DOA: 01/20/2019  PCP: [name], FNP  Admit date: 01/20/2019 Discharge date: 01/26/2019  Admitted From: Home Discharge disposition: Home with home health PT   Code Status: Full Code   Recommendations for Outpatient Follow-Up:   1. Follow-up with GI as an outpatient 2. Stop alcohol  Discharge Diagnosis:   Principal Problem:   Fall Active Problems:   Decompensated hepatic cirrhosis (HCC)   Alcohol abuse   Hepatitis C antibody positive in blood   Schizophrenia (HCC)   GERD (gastroesophageal reflux disease)   Thrombocytopenia (HCC)   Polysubstance abuse (HCC)   Alcoholic intoxication without complication (HCC)   Epistaxis   Head injury   Scalp laceration, initial encounter    History of Present Illness / Brief narrative:  Briana Sweeney a 48 y.o.femalewith PMH ofpolysubstance abuse, chronic alcoholism, hepatitis C, liver cirrhosis, chronic pancytopenia, bipolar disorder with anxiety features. She was brought to the ED on 5/13 from home after a fall.Patient has continued to drink alcohol despite her advanced liver disease. She was apparently at a friend's house after drinking excessively when she fell and hit her head. Patient lost some consciousness briefly. Hence brought to the ED.   She was found to have a scalp hematoma on the right side.  Alcohol level was 436. CT head without contrast CT cervical spine showed no acute findings. COVID-19 screen is negative.  Initial work-up showed no evidence of intracranial bleed but she was having epistaxis.She apparently had been having epistaxis all week long. Her platelet count was very lowat 16,000.   She was admitted therefore with severe thrombocytopenia, fall and ongoing epistaxis.[name] Kitchen  Hospital Course:   Fall/right scalp hematoma -While intoxicated with alcohol.  CT head on admission no intracranial hemorrhage,repeat CT head showed no  intracranial hemorrhage.  -PT eval obtained.  Patient is able to ambulate on the hallway with minimal assistance. -Home health PT advised.  Chronic daily alcohol user -Intoxicated on arrival.  She was started on CIWA protocol to avoid alcohol withdrawal symptoms.  No withdrawal symptoms in the hospital.  Patient want few doses of Ativan to take home.  She is afraid she will crave for alcohol otherwise.  Thrombocytopenia with intermittent epistaxis plt 16,000 on presentation, s/p plt transfusion on 5/14, plt continues to remain low, 18,000 today.   On chart review, it was noted that patient has chronic thrombocytopenia for several months now.  This is likely secondary to liver cirrhosis.   No further inpatient evaluation required.  At discharge, patient has been counseled to follow-up with hematology.  She is also been counseled to avoid intoxication and risk of fall.  Alcohol cirrhosis With h/o ascites required paracentesis a year ago.  Underwent ultrasound abdomen 5/17, no ascites noted. Elevated ammonia level, started on lactulose Counseled to quit alcohol  Seizure Continue dilantin   H/o hep c , unclear detail  Polysubstance abuse -Counseled to quit.  Okay to discharge home today.   Subjective:  Patient was seen and examined this afternoon.  Middle-aged Caucasian female.  Not in distress.  Not in tremors.  Discharge Exam:   Vitals:   01/26/19 0522 01/26/19 0531 01/26/19 1002 01/26/19 1215  BP: 109/85  112/84 113/82  Pulse: 74  92 72  Resp: 18  16 15   Temp: (!) 97.4 F (36.3 C) (!) 97.5 F (36.4 C)    TempSrc: Oral Oral Oral Oral  SpO2: 97%  97% 98%  Weight:  Body mass index is 24.67 kg/m.  General exam: Appears calm and comfortable.  Sleeping in bed. Skin: No rashes, lesions or ulcers. HEENT: Atraumatic, normocephalic, supple neck, no obvious bleeding Lungs: Clear to auscultation bilaterally CVS: Regular rate and rhythm, no murmur GI/Abd soft,  nondistended, nontender, bowel sound present CNS: Alert, awake, oriented x3, no asterixis tremors Psychiatry: Depressed look Extremities: No pedal edema no calf tenderness   Discharge Instructions:  Wound care: None Discharge Instructions    Diet - low sodium heart healthy   Complete by:  As directed    Increase activity slowly   Complete by:  As directed       Allergies as of 01/26/2019   No Known Allergies     Medication List    STOP taking these medications   ibuprofen 200 MG tablet Commonly known as:  ADVIL     TAKE these medications   famotidine 20 MG tablet Commonly known as:  PEPCID Take 1 tablet (20 mg total) by mouth daily.   FLUoxetine 40 MG capsule Commonly known as:  PROZAC Take 1 capsule (40 mg total) by mouth daily.   folic acid 1 MG tablet Commonly known as:  FOLVITE Take 1 tablet (1 mg total) by mouth daily.   gabapentin 300 MG capsule Commonly known as:  NEURONTIN Take 2 capsules 2 times a day. What changed:    how much to take  when to take this  additional instructions   haloperidol 2 MG tablet Commonly known as:  HALDOL Take 1 tablet (2 mg total) by mouth at bedtime.   hydrOXYzine 25 MG tablet Commonly known as:  ATARAX/VISTARIL Take 1 tablet (25 mg total) by mouth every 6 (six) hours as needed for itching. What changed:  reasons to take this   lactulose 10 GM/15ML solution Commonly known as:  CHRONULAC Take 30 mLs (20 g total) by mouth 3 (three) times daily. Goal to have 2-3 BMs daily What changed:  Another medication with the same name was added. Make sure you understand how and when to take each.   lactulose 10 GM/15ML solution Commonly known as:  CHRONULAC Take 15 mLs (10 g total) by mouth 3 (three) times daily for 30 days. What changed:  You were already taking a medication with the same name, and this prescription was added. Make sure you understand how and when to take each.   LORazepam 1 MG tablet Commonly known as:   ATIVAN Take 1 tablet (1 mg total) by mouth every 8 (eight) hours as needed for up to 7 days for anxiety (withdrawal symptoms:  anxiety, agitation, insomnia, diaphoresis, nausea, vomiting, tremors).   multivitamin with minerals Tabs tablet Take 1 tablet by mouth daily.   naltrexone 50 MG tablet Commonly known as:  DEPADE Take 1 tablet (50 mg total) by mouth daily.   phenytoin 300 MG ER capsule Commonly known as:  DILANTIN Take 1 capsule (300 mg total) by mouth daily.   thiamine 100 MG tablet Take 1 tablet (100 mg total) by mouth daily. What changed:  Another medication with the same name was added. Make sure you understand how and when to take each.   thiamine 100 MG tablet Take 1 tablet (100 mg total) by mouth daily for 30 days. Start taking on:  Jan 27, 2019 What changed:  You were already taking a medication with the same name, and this prescription was added. Make sure you understand how and when to take each.   topiramate 50 MG  tablet Commonly known as:  TOPAMAX Take 1 tablet (50 mg total) by mouth daily.   traZODone 50 MG tablet Commonly known as:  DESYREL Take 2 tablets (100 mg total) by mouth at bedtime.       Time coordinating discharge: 35 minutes  The results of significant diagnostics from this hospitalization (including imaging, microbiology, ancillary and laboratory) are listed below for reference.    Procedures and Diagnostic Studies:   Ct Head Wo Contrast  Result Date: 01/21/2019 CLINICAL DATA:  Initial evaluation for acute severe headache status post fall. EXAM: CT HEAD WITHOUT CONTRAST TECHNIQUE: Contiguous axial images were obtained from the base of the skull through the vertex without intravenous contrast. COMPARISON:  Prior CT from 01/20/2019 FINDINGS: Brain: Age-related cerebral atrophy. Small parenchymal calcification noted within the mid left cerebellum, stable. No acute intracranial hemorrhage. No acute large vessel territory infarct. No mass lesion,  midline shift or mass effect. No hydrocephalus. No extra-axial fluid collection. Vascular: No hyperdense vessel. Skull: Evolving right parietal temporal scalp contusion. Calvarium intact. Sinuses/Orbits: Globes orbital soft tissues demonstrate no acute finding. Paranasal sinuses and mastoid air cells are clear. Other: None. IMPRESSION: 1. No acute intracranial hemorrhage or other abnormality identified. 2. Evolving right parietotemporal scalp contusion. No calvarial fracture. Electronically Signed   By: Jeannine Boga M.D.   On: 01/21/2019 18:04   Ct Head Wo Contrast  Result Date: 01/20/2019 CLINICAL DATA:  Status post trip and fall today with a blow to the head and scalp laceration. Initial encounter. EXAM: CT HEAD WITHOUT CONTRAST CT CERVICAL SPINE WITHOUT CONTRAST TECHNIQUE: Multidetector CT imaging of the head and cervical spine was performed following the standard protocol without intravenous contrast. Multiplanar CT image reconstructions of the cervical spine were also generated. COMPARISON:  Head and cervical spine CT scans 09/06/2018. FINDINGS: CT HEAD FINDINGS Brain: No evidence of acute infarction, hemorrhage, hydrocephalus, extra-axial collection or mass lesion/mass effect. Cortical atrophy again seen. Vascular: No hyperdense vessel or unexpected calcification. Skull: Intact.  No focal lesion. Sinuses/Orbits: Negative. Other: Scalp laceration on the right noted. CT CERVICAL SPINE FINDINGS Alignment: Maintained with straightening of lordosis noted. Skull base and vertebrae: No acute fracture. No primary bone lesion or focal pathologic process. Soft tissues and spinal canal: No prevertebral fluid or swelling. No visible canal hematoma. Disc levels: Disc space height is maintained. Facet arthropathy on the left at C4-5 noted. Upper chest: Lung apices clear. Other: None. IMPRESSION: Scalp laceration on the right without underlying fracture. No other acute abnormality head or cervical spine. Cortical  atrophy. Electronically Signed   By: Inge Rise M.D.   On: 01/20/2019 18:28   Ct Cervical Spine Wo Contrast  Result Date: 01/20/2019 CLINICAL DATA:  Status post trip and fall today with a blow to the head and scalp laceration. Initial encounter. EXAM: CT HEAD WITHOUT CONTRAST CT CERVICAL SPINE WITHOUT CONTRAST TECHNIQUE: Multidetector CT imaging of the head and cervical spine was performed following the standard protocol without intravenous contrast. Multiplanar CT image reconstructions of the cervical spine were also generated. COMPARISON:  Head and cervical spine CT scans 09/06/2018. FINDINGS: CT HEAD FINDINGS Brain: No evidence of acute infarction, hemorrhage, hydrocephalus, extra-axial collection or mass lesion/mass effect. Cortical atrophy again seen. Vascular: No hyperdense vessel or unexpected calcification. Skull: Intact.  No focal lesion. Sinuses/Orbits: Negative. Other: Scalp laceration on the right noted. CT CERVICAL SPINE FINDINGS Alignment: Maintained with straightening of lordosis noted. Skull base and vertebrae: No acute fracture. No primary bone lesion or focal pathologic process. Soft  tissues and spinal canal: No prevertebral fluid or swelling. No visible canal hematoma. Disc levels: Disc space height is maintained. Facet arthropathy on the left at C4-5 noted. Upper chest: Lung apices clear. Other: None. IMPRESSION: Scalp laceration on the right without underlying fracture. No other acute abnormality head or cervical spine. Cortical atrophy. Electronically Signed   By: Inge Rise M.D.   On: 01/20/2019 18:28     Labs:   Basic Metabolic Panel: Recent Labs  Lab 01/21/19 0322 01/22/19 0354 01/23/19 0352 01/24/19 0453 01/25/19 0542  NA 144 132* 134* 136 133*  K 3.9 3.7 3.3* 3.6 3.5  CL 108 100 101 104 106  CO2 21* 24 22 20* 20*  GLUCOSE 90 113* 110* 94 142*  BUN 5* <5* <5* <5* 5*  CREATININE 0.59 0.65 0.54 0.54 0.58  CALCIUM 7.8* 8.9 9.0 8.9 8.8*  MG  --  1.8 1.8  1.6* 1.7   GFR Estimated Creatinine Clearance: 69.6 mL/min (by C-G formula based on SCr of 0.58 mg/dL). Liver Function Tests: Recent Labs  Lab 01/21/19 0322 01/22/19 0354 01/23/19 0352 01/24/19 0453 01/25/19 0542  AST 91* 92* 73* 48* 38  ALT 31 29 28 23 21   ALKPHOS 174* 175* 140* 127* 128*  BILITOT 0.9 2.9* 2.6* 1.5* 0.9  PROT 7.9 7.8 7.9 6.9 7.0  ALBUMIN 3.2* 3.1* 3.0* 2.8* 2.6*   Recent Labs  Lab 01/23/19 0352  LIPASE 47   Recent Labs  Lab 01/23/19 0352 01/24/19 0453  AMMONIA 71* 73*   Coagulation profile Recent Labs  Lab 01/23/19 0352 01/24/19 0453  INR 1.3* 1.3*    CBC: Recent Labs  Lab 01/20/19 1942 01/21/19 0322 01/22/19 0354 01/23/19 0352 01/24/19 0453 01/25/19 0542  WBC 3.0* 5.4 2.2* 4.8 2.7* 2.9*  NEUTROABS 1.0*  --  0.9* 3.7 1.3* 1.1*  HGB 14.3 12.9 12.9 12.3 12.0 12.3  HCT 42.8 38.7 37.5 35.6* 35.9* 36.7  MCV 94.1 93.7 91.9 92.5 94.2 95.6  PLT 16* 30* 11* 16* 19* 18*   Cardiac Enzymes: No results for input(s): CKTOTAL, CKMB, CKMBINDEX, TROPONINI in the last 168 hours. BNP: Invalid input(s): POCBNP CBG: No results for input(s): GLUCAP in the last 168 hours. D-Dimer No results for input(s): DDIMER in the last 72 hours. Hgb A1c No results for input(s): HGBA1C in the last 72 hours. Lipid Profile No results for input(s): CHOL, HDL, LDLCALC, TRIG, CHOLHDL, LDLDIRECT in the last 72 hours. Thyroid function studies No results for input(s): TSH, T4TOTAL, T3FREE, THYROIDAB in the last 72 hours.  Invalid input(s): FREET3 Anemia work up No results for input(s): VITAMINB12, FOLATE, FERRITIN, TIBC, IRON, RETICCTPCT in the last 72 hours. Microbiology Recent Results (from the past 240 hour(s))  SARS Coronavirus 2 (CEPHEID - Performed in [place] hospital lab), Hosp Order     Status: None   Collection Time: 01/20/19 10:44 PM  Result Value Ref Range Status   SARS Coronavirus 2 NEGATIVE NEGATIVE Final    Comment: (NOTE) If result is  NEGATIVE SARS-CoV-2 target nucleic acids are NOT DETECTED. The SARS-CoV-2 RNA is generally detectable in upper and lower  respiratory specimens during the acute phase of infection. The lowest  concentration of SARS-CoV-2 viral copies this assay can detect is 250  copies / mL. A negative result does not preclude SARS-CoV-2 infection  and should not be used as the sole basis for treatment or other  patient management decisions.  A negative result may occur with  improper specimen collection / handling, submission of specimen  other  than nasopharyngeal swab, presence of viral mutation(s) within the  areas targeted by this assay, and inadequate number of viral copies  (<250 copies / mL). A negative result must be combined with clinical  observations, patient history, and epidemiological information. If result is POSITIVE SARS-CoV-2 target nucleic acids are DETECTED. The SARS-CoV-2 RNA is generally detectable in upper and lower  respiratory specimens dur ing the acute phase of infection.  Positive  results are indicative of active infection with SARS-CoV-2.  Clinical  correlation with patient history and other diagnostic information is  necessary to determine patient infection status.  Positive results do  not rule out bacterial infection or co-infection with other viruses. If result is PRESUMPTIVE POSTIVE SARS-CoV-2 nucleic acids MAY BE PRESENT.   A presumptive positive result was obtained on the submitted specimen  and confirmed on repeat testing.  While 2019 novel coronavirus  (SARS-CoV-2) nucleic acids may be present in the submitted sample  additional confirmatory testing may be necessary for epidemiological  and / or clinical management purposes  to differentiate between  SARS-CoV-2 and other Sarbecovirus currently known to infect humans.  If clinically indicated additional testing with an alternate test  methodology [phone]) is advised. The SARS-CoV-2 RNA is generally  detectable  in upper and lower respiratory sp ecimens during the acute  phase of infection. The expected result is Negative. Fact Sheet for Patients:  StrictlyIdeas.no Fact Sheet for Healthcare Providers: BankingDealers.co.za This test is not yet approved or cleared by the [place] FDA and has been authorized for detection and/or diagnosis of SARS-CoV-2 by FDA under an Emergency Use Authorization (EUA).  This EUA will remain in effect (meaning this test can be used) for the duration of the COVID-19 declaration under Section 564(b)(1) of the Act, 21 U.S.C. section 360bbb-3(b)(1), unless the authorization is terminated or revoked sooner. Performed at [facility], [place] [address], [place], [place] [address]   Culture, blood (routine x 2)     Status: None   Collection Time: 01/21/19  7:50 PM  Result Value Ref Range Status   Specimen Description BLOOD LEFT ANTECUBITAL  Final   Special Requests   Final    BOTTLES DRAWN AEROBIC AND ANAEROBIC Blood Culture adequate volume   Culture   Final    NO GROWTH 5 DAYS Performed at [facility], [place] [address], [place], [place] [address]    Report Status 01/26/2019 FINAL  Final  Culture, blood (routine x 2)     Status: None   Collection Time: 01/21/19  8:03 PM  Result Value Ref Range Status   Specimen Description BLOOD LEFT WRIST  Final   Special Requests   Final    BOTTLES DRAWN AEROBIC AND ANAEROBIC Blood Culture results may not be optimal due to an inadequate volume of blood received in culture bottles   Culture   Final    NO GROWTH 5 DAYS Performed at [facility], [place] [address] [place], [place] [address]    Report Status 01/26/2019 FINAL  Final  MRSA PCR Screening     Status: None   Collection Time: 01/21/19 10:40 PM  Result Value Ref Range Status   MRSA by PCR NEGATIVE NEGATIVE Final    Comment:        The GeneXpert MRSA Assay (FDA approved for NASAL specimens only), is one  component of a comprehensive MRSA colonization surveillance program. It is not intended to diagnose MRSA infection nor to guide or monitor treatment for  MRSA infections. Performed at [facility], [place] [address], [place], [place] [address]     Signed: Terrilee Croak  [facility] 01/26/2019, 3:03 PM

## 2019-01-27 MED FILL — GABAPENTIN 100 MG CAP: 100 | 30 days supply | Qty: 180 | Fill #1

## 2019-02-02 MED FILL — hydrOXYzine HCL 25 MG TABS: 25 | 30 days supply | Qty: 90 | Fill #1

## 2019-02-03 ENCOUNTER — Emergency Department (HOSPITAL_COMMUNITY)
Admission: EM | Admit: 2019-02-03 | Discharge: 2019-02-03 | Disposition: A | Discharge status: Home / Self Care | Payer: Medicaid Other | Attending: Emergency Medicine | Admitting: Emergency Medicine

## 2019-02-03 ENCOUNTER — Emergency Department (HOSPITAL_COMMUNITY): Payer: Medicaid Other

## 2019-02-03 ENCOUNTER — Other Ambulatory Visit: Payer: Self-pay

## 2019-02-03 DIAGNOSIS — F10920 Alcohol use, unspecified with intoxication, uncomplicated: Secondary | ICD-10-CM | POA: Diagnosis not present

## 2019-02-03 DIAGNOSIS — Z1159 Encounter for screening for other viral diseases: Secondary | ICD-10-CM | POA: Insufficient documentation

## 2019-02-03 DIAGNOSIS — R51 Headache: Secondary | ICD-10-CM | POA: Diagnosis present

## 2019-02-03 DIAGNOSIS — Y908 Blood alcohol level of 240 mg/100 ml or more: Secondary | ICD-10-CM | POA: Insufficient documentation

## 2019-02-03 LAB — COMPREHENSIVE METABOLIC PANEL
ALT: 27 U/L (ref 0–44)
AST: 58 U/L — ABNORMAL HIGH (ref 15–41)
Albumin: 3.1 g/dL — ABNORMAL LOW (ref 3.5–5.0)
Alkaline Phosphatase: 153 U/L — ABNORMAL HIGH (ref 38–126)
Anion gap: 11 (ref 5–15)
BUN: <5 mg/dL — ABNORMAL LOW (ref 6–20)
CO2: 22 mmol/L (ref 22–32)
Calcium: 8.4 mg/dL — ABNORMAL LOW (ref 8.9–10.3)
Chloride: 106 mmol/L (ref 98–111)
Creatinine, Ser: 0.48 mg/dL (ref 0.44–1.00)
GFR calc Af Amer: >60 mL/min (ref >60)
GFR calc non Af Amer: >60 mL/min (ref >60)
Glucose, Bld: 109 mg/dL — ABNORMAL HIGH (ref 70–99)
Potassium: 4 mmol/L (ref 3.5–5.1)
Sodium: 139 mmol/L (ref 135–145)
Total Bilirubin: 1 mg/dL (ref 0.3–1.2)
Total Protein: 7.7 g/dL (ref 6.5–8.1)

## 2019-02-03 LAB — PHENYTOIN LEVEL, TOTAL: Phenytoin Lvl: 6.4 ug/mL — ABNORMAL LOW (ref 10.0–20.0)

## 2019-02-03 LAB — ETHANOL: Alcohol, Ethyl (B): 300 mg/dL — ABNORMAL HIGH (ref <10)

## 2019-02-03 LAB — PROTIME-INR
INR: 1.1 (ref 0.8–1.2)
Prothrombin Time: 14.4 seconds (ref 11.4–15.2)

## 2019-02-03 LAB — CBC WITH DIFFERENTIAL/PLATELET
Abs Immature Granulocytes: 0 10*3/uL (ref 0.00–0.07)
Basophils Absolute: 0.1 10*3/uL (ref 0.0–0.1)
Basophils Relative: 2 %
Eosinophils Absolute: 0.1 10*3/uL (ref 0.0–0.5)
Eosinophils Relative: 4 %
HCT: 40 % (ref 36.0–46.0)
Hemoglobin: 13.3 g/dL (ref 12.0–15.0)
Immature Granulocytes: 0 %
Lymphocytes Relative: 58 %
Lymphs Abs: 1.7 10*3/uL (ref 0.7–4.0)
MCH: 31.6 pg (ref 26.0–34.0)
MCHC: 33.3 g/dL (ref 30.0–36.0)
MCV: 95 fL (ref 80.0–100.0)
Monocytes Absolute: 0.3 10*3/uL (ref 0.1–1.0)
Monocytes Relative: 10 %
Neutro Abs: 0.8 10*3/uL — ABNORMAL LOW (ref 1.7–7.7)
Neutrophils Relative %: 26 %
Platelets: 57 10*3/uL — ABNORMAL LOW (ref 150–400)
RBC: 4.21 MIL/uL (ref 3.87–5.11)
RDW: 16.9 % — ABNORMAL HIGH (ref 11.5–15.5)
WBC: 3 10*3/uL — ABNORMAL LOW (ref 4.0–10.5)
nRBC: 0 % (ref 0.0–0.2)

## 2019-02-03 LAB — TYPE AND SCREEN
ABO/RH(D): O POS
Antibody Screen: NEGATIVE

## 2019-02-03 LAB — SARS CORONAVIRUS 2 BY RT PCR (HOSPITAL ORDER, PERFORMED IN ~~LOC~~ HOSPITAL LAB): SARS Coronavirus 2: NEGATIVE

## 2019-02-03 LAB — AMMONIA: Ammonia: 48 umol/L — ABNORMAL HIGH (ref 9–35)

## 2019-02-03 NOTE — ED Triage Notes (Signed)
Pt complains of right sided headache that started today with associated dizziness. Pt also reports syncope, when asked about details about episode pt is unable to provide further information and states she just wants to sleep. Pt has healing laceration with sutures to right side of head that she states happened 5 days ago.

## 2019-02-03 NOTE — Discharge Instructions (Addendum)
Please return for any problem.   Follow up with your regular care providers as instructed.  

## 2019-02-03 NOTE — ED Provider Notes (Signed)
[facility] EMERGENCY DEPARTMENT Provider Note   CSN: [id number] Arrival date & time: 02/03/19  1912    History   Chief Complaint Chief Complaint  Patient presents with  . Headache    HPI Siona Coulston is a 48 y.o. female.     48 year old female with prior medical history as detailed below presents for evaluation of headache.  Patient is clinically intoxicated on exam.  She is not very forthcoming with details.  She does report headache.  She reports recent admission.  She denies recent head trauma.  She denies associated neck pain, fever, visual changes, chest pain, shortness of breath, or other acute complaint.  She does report recent alcohol intake.  The history is provided by the patient and medical records.  Headache  Pain location:  Generalized Quality:  Dull Radiates to:  Does not radiate Onset quality:  Unable to specify Timing:  Constant Progression:  Waxing and waning Relieved by:  Nothing Worsened by:  Nothing   Past Medical History:  Diagnosis Date  . Anxiety   . Bipolar affective disorder ([place])    With anxiety features  . 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 Depression   . 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 ETOHism ([place])   . GERD (gastroesophageal reflux disease)   . Hematemesis 02/10/2018  . Hepatitis C 2018   hepatitis c and alcohol related hepatitis  . Heroin abuse ([place])   . History of blood transfusion    "blood doesn't clot; I fell down and had to have a transfusion"  . History of kidney stones   . Migraine    "when I get really stressed" (09/01/2017)  . Schizophrenia ([place])   . Seizures ([place])    "when I run out of my RX; lots recently" (09/01/2017)    Patient Active Problem List   Diagnosis Date Noted  . Head injury   . Scalp laceration, initial encounter   . Fall 01/20/2019  . Epistaxis 01/20/2019  . Altered mental status   . 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 Anxiety  10/27/2018  . Neuropathy 10/27/2018  . Abscess of bursa of left elbow 09/23/2018  . Olecranon bursitis of left elbow   . Erysipelas 09/13/2018  . Acute metabolic encephalopathy 65/99/3570  . Overdose 08/22/2018  . Hypotension 08/22/2018  . Seizure ([place]) 08/22/2018  . Substance induced mood disorder ([place]) 07/17/2018  . Cocaine dependence without complication ([place]) 17/79/3903  . Polysubstance abuse ([place]) 04/08/2018  . Encephalopathy, 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[place]) 04/08/2018  . Hepatitis C 03/18/2018  . Portal hypertension ([place]) 03/18/2018  . Alcoholic intoxication without complication ([place]) 00/92/3300  . Depression 03/18/2018  . Upper GI bleed 02/10/2018  . Alcohol withdrawal, with unspecified complication ([place]) 76/22/6333  . Chronic anemia   . Thrombocytopenia due to drugs   . Suicidal ideation   . Thrombocytopenia ([place]) 01/02/2018  . GERD (gastroesophageal reflux disease) 11/01/2017  . Trichimoniasis 10/30/2017  . Alcohol dependence ([place]) 10/29/2017  . MDD (major depressive disorder), severe ([place]) 10/28/2017  . Acute hyperactive alcohol withdrawal delirium ([place]) 10/09/2017  . Schizophrenia ([place]) 10/09/2017  . Alcohol abuse with alcohol-induced mood disorder ([place])   . Alcohol withdrawal syndrome with complication ([place]) 54/56/2563  . Esophageal varices without bleeding ([place])   . Hematemesis 09/01/2017  . Ascites due to alcoholic cirrhosis ([place])   . Decompensated hepatic cirrhosis ([place]) 07/23/2017  . Alcohol abuse 07/23/2017  . Hepatitis C antibody positive in blood  07/23/2017  . Hypokalemia 07/23/2017  . Jaundice 07/23/2017  . Coagulopathy ([place]) 07/23/2017  . Hypomagnesemia 07/23/2017  . Pancytopenia ([place]) 07/23/2017  . Alcoholic cirrhosis of liver with ascites ([place]) 07/23/2017  . Bacterial vaginosis 06/03/2017  . UTI (urinary tract infection) 06/02/2017    Past Surgical History:  Procedure Laterality Date  . ESOPHAGOGASTRODUODENOSCOPY N/A 09/03/2017   Procedure:  ESOPHAGOGASTRODUODENOSCOPY (EGD);  Surgeon: Doran Stabler, MD;  Location: [facility];  Service: Gastroenterology;  Laterality: N/A;  . FINGER FRACTURE SURGERY Left    "shattered my pinky"  . FRACTURE SURGERY    . I&D EXTREMITY Left 09/18/2018   Procedure: IRRIGATION AND DEBRIDEMENT EXTREMITY;  Surgeon: Leanora Cover, MD;  Location: [facility];  Service: Orthopedics;  Laterality: Left;  . IR PARACENTESIS  07/23/2017  . IR PARACENTESIS  07/2017   "did it twice in the same week" (09/01/2017)  . SHOULDER OPEN ROTATOR CUFF REPAIR Right   . TUBAL LIGATION    . VAGINAL HYSTERECTOMY       OB History   No obstetric history on file.      Home Medications    Prior to Admission medications   Medication Sig Start Date End Date Taking? Authorizing Provider  famotidine (PEPCID) 20 MG tablet Take 1 tablet (20 mg total) by mouth daily. 09/23/18   Eugenie Filler, MD  FLUoxetine (PROZAC) 40 MG capsule Take 1 capsule (40 mg total) by mouth daily. 12/23/18   [name], FNP  folic acid (FOLVITE) 1 MG tablet Take 1 tablet (1 mg total) by mouth daily. 12/23/18   [name], FNP  gabapentin (NEURONTIN) 300 MG capsule Take 2 capsules 2 times a day. Patient taking differently: 600 mg 2 (two) times daily.  12/23/18   [name], FNP  haloperidol (HALDOL) 2 MG tablet Take 1 tablet (2 mg total) by mouth at bedtime. 12/23/18   [name], FNP  hydrOXYzine (ATARAX/VISTARIL) 25 MG tablet Take 1 tablet (25 mg total) by mouth every 6 (six) hours as needed for itching. Patient taking differently: Take 25 mg by mouth every 6 (six) hours as needed for anxiety.  10/07/18   [name], FNP  lactulose (CHRONULAC) 10 GM/15ML solution Take 30 mLs (20 g total) by mouth 3 (three) times daily. Goal to have 2-3 BMs daily 12/14/18   Isabelle Course, MD  lactulose [facility]) 10 GM/15ML solution Take 15 mLs (10 g total) by mouth 3 (three) times daily for 30 days. 01/26/19 02/25/19  Terrilee Croak, MD   Multiple Vitamin (MULTIVITAMIN WITH MINERALS) TABS tablet Take 1 tablet by mouth daily. 08/26/18   Hongalgi, Lenis Dickinson, MD  naltrexone (DEPADE) 50 MG tablet Take 1 tablet (50 mg total) by mouth daily. 12/14/18   Isabelle Course, MD  phenytoin (DILANTIN) 300 MG ER capsule Take 1 capsule (300 mg total) by mouth daily. 12/23/18   [name], FNP  thiamine 100 MG tablet Take 1 tablet (100 mg total) by mouth daily. 12/23/18   [name], FNP  thiamine 100 MG tablet Take 1 tablet (100 mg total) by mouth daily for 30 days. 01/27/19 02/26/19  Terrilee Croak, MD  topiramate (TOPAMAX) 50 MG tablet Take 1 tablet (50 mg total) by mouth daily. 10/20/18   [name], FNP  traZODone (DESYREL) 50 MG tablet Take 2 tablets (100 mg total) by mouth at bedtime. 12/23/18   [name], FNP    Family History Family History  Problem Relation  Age of Onset  . Lung cancer Mother 84  . Alcohol abuse Mother   . Throat cancer Father 55    Social History Social History   Tobacco Use  . Smoking status: Never Smoker  . Smokeless tobacco: Never Used  Substance Use Topics  . Alcohol use: Yes    Alcohol/week: 63.0 standard drinks    Types: 63 Cans of beer per week    Comment: weekly "I have cut back"  . Drug use: Yes    Types: Marijuana, Cocaine     Allergies   Patient has no known allergies.   Review of Systems Review of Systems  Neurological: Positive for headaches.  All other systems reviewed and are negative.    Physical Exam Updated Vital Signs BP 91/63   Pulse 74   Temp 98.4 F (36.9 C) (Oral)   Resp 17   Ht 5' (1.524 m)   Wt 59 kg   SpO2 94%   BMI 25.39 kg/m   Physical Exam Vitals signs and nursing note reviewed.  Constitutional:      General: She is not in acute distress.    Appearance: She is well-developed.  HENT:     Head: Normocephalic and atraumatic.  Eyes:     Conjunctiva/sclera: Conjunctivae normal.     Pupils: Pupils are equal, round, and reactive to  light.  Neck:     Musculoskeletal: Normal range of motion and neck supple.  Cardiovascular:     Rate and Rhythm: Normal rate and regular rhythm.     Heart sounds: Normal heart sounds.  Pulmonary:     Effort: Pulmonary effort is normal. No respiratory distress.     Breath sounds: Normal breath sounds.  Abdominal:     General: There is no distension.     Palpations: Abdomen is soft.     Tenderness: There is no abdominal tenderness.  Musculoskeletal: Normal range of motion.        General: No deformity.  Skin:    General: Skin is warm and dry.     Comments: Multiple areas of ecchymosis of various ages to both upper extremities of both lower extremities.  Neurological:     Mental Status: She is alert and oriented to person, place, and time.     GCS: GCS eye subscore is 4. GCS verbal subscore is 5. GCS motor subscore is 6.     Cranial Nerves: No cranial nerve deficit, dysarthria or facial asymmetry.     Sensory: No sensory deficit.     Motor: No weakness.     Deep Tendon Reflexes: Reflexes normal.     Comments: Appears clinically intoxicated      ED Treatments / Results  Labs (all labs ordered are listed, but only abnormal results are displayed) Labs Reviewed  ETHANOL - Abnormal; Notable for the following components:      Result Value   Alcohol, Ethyl (B) 300 (*)    All other components within normal limits  COMPREHENSIVE METABOLIC PANEL - Abnormal; Notable for the following components:   Glucose, Bld 109 (*)    BUN <5 (*)    Calcium 8.4 (*)    Albumin 3.1 (*)    AST 58 (*)    Alkaline Phosphatase 153 (*)    All other components within normal limits  CBC WITH DIFFERENTIAL/PLATELET - Abnormal; Notable for the following components:   WBC 3.0 (*)    RDW 16.9 (*)    Platelets 57 (*)    Neutro Abs 0.8 (*)  All other components within normal limits  AMMONIA - Abnormal; Notable for the following components:   Ammonia 48 (*)    All other components within normal limits   PHENYTOIN LEVEL, TOTAL - Abnormal; Notable for the following components:   Phenytoin Lvl 6.4 (*)    All other components within normal limits  SARS CORONAVIRUS 2 (HOSPITAL ORDER, [facility])  PROTIME-INR  URINALYSIS, ROUTINE W REFLEX MICROSCOPIC  RAPID URINE DRUG SCREEN, HOSP PERFORMED  TYPE AND SCREEN    EKG EKG Interpretation  Date/Time:  Wednesday Feb 03 2019 19:26:00 EDT Ventricular Rate:  78 PR Interval:    QRS Duration: 78 QT Interval:  397 QTC Calculation: 453 R Axis:   67 Text Interpretation:  Sinus rhythm Confirmed by Dene Gentry [phone]) on 02/03/2019 7:44:20 PM   Radiology Ct Head Wo Contrast  Result Date: 02/03/2019 CLINICAL DATA:  Altered mental status EXAM: CT HEAD WITHOUT CONTRAST TECHNIQUE: Contiguous axial images were obtained from the base of the skull through the vertex without intravenous contrast. COMPARISON:  01/21/2019 head CT FINDINGS: Brain: There is no mass, hemorrhage or extra-axial collection. The size and configuration of the ventricles and extra-axial CSF spaces are normal. The brain parenchyma is normal, without acute or chronic infarction. Vascular: No abnormal hyperdensity of the major intracranial arteries or dural venous sinuses. No intracranial atherosclerosis. Skull: The visualized skull base, calvarium and extracranial soft tissues are normal. Sinuses/Orbits: No fluid levels or advanced mucosal thickening of the visualized paranasal sinuses. No mastoid or middle ear effusion. The orbits are normal. IMPRESSION: No acute intracranial abnormality Electronically Signed   By: Ulyses Jarred M.D.   On: 02/03/2019 19:57   Dg Chest Port 1 View  Result Date: 02/03/2019 CLINICAL DATA:  Weakness EXAM: PORTABLE CHEST 1 VIEW COMPARISON:  12/05/2018 FINDINGS: Calcified left hilar lymph nodes are again noted. There is no pneumothorax or large pleural effusion. The cardiac size is stable. There is no acute osseous abnormality.  IMPRESSION: No active disease. Electronically Signed   By: Constance Holster M.D.   On: 02/03/2019 20:34    Procedures Procedures (including critical care time)  Medications Ordered in ED Medications - No data to display   Initial Impression / Assessment and Plan / ED Course  I have reviewed the triage vital signs and the nursing notes.  Pertinent labs & imaging results that were available during my care of the patient were reviewed by me and considered in my medical decision making (see chart for details).  Clinical Course as of Feb 02 2218  Wed Feb 03, 2019  1944 DG Chest [place] 1 View [PM]    Clinical Course User Index [PM] Valarie Merino, MD      MDM  Screen complete  Trinitie Mcgirr was evaluated in Emergency Department on 02/03/2019 for the symptoms described in the history of present illness. She was evaluated in the context of the global COVID-19 pandemic, which necessitated consideration that the patient might be at risk for infection with the SARS-CoV-2 virus that causes COVID-19. Institutional protocols and algorithms that pertain to the evaluation of patients at risk for COVID-19 are in a state of rapid change based on information released by regulatory bodies including the CDC and federal and state organizations. These policies and algorithms were followed during the patient's care in the ED.  Patient is presenting for evaluation of reported headache.  Patient does appear to be clinically intoxicated upon arrival.  Screening labs obtained in the ED are  without evidence of significant acute pathology.  Patient does have a prior history of thrombocytopenia.  There is no evidence of acute hemorrhage at this time.  CT imaging does not reveal acute process.  Following period of observation the patient feels improved.  She appears to be appropriate for discharge.  Importance of close follow-up is stressed.  Strict return precautions given and understood.  Final Clinical  Impressions(s) / ED Diagnoses   Final diagnoses:  Alcoholic intoxication without complication [facility])    ED Discharge Orders    None       Valarie Merino, MD 02/03/19 2310

## 2019-02-04 ENCOUNTER — Telehealth: Payer: Self-pay | Admitting: Family Medicine

## 2019-02-04 LAB — PATHOLOGIST SMEAR REVIEW

## 2019-02-04 NOTE — Telephone Encounter (Signed)
Patient called the on-call provider at 550pm on 02/03/2019. Patient states that she fell and there is blood "gushing from my head". Patient with slurred speech and incoherent. There is a female in the background who states they need to go to the ED. Advised patient to go to the ED immediately. She states that she had to call and ask permission from her provider to go to the ED. This provider advised her to go immediately to the ED for further evaluation and treatment.

## 2019-02-12 ENCOUNTER — Ambulatory Visit (HOSPITAL_COMMUNITY): Payer: Medicaid Other | Admitting: Psychiatry

## 2019-02-17 ENCOUNTER — Encounter (HOSPITAL_COMMUNITY): Payer: Self-pay | Admitting: Emergency Medicine

## 2019-02-17 ENCOUNTER — Other Ambulatory Visit: Payer: Self-pay

## 2019-02-17 ENCOUNTER — Emergency Department (HOSPITAL_COMMUNITY)
Admission: EM | Admit: 2019-02-17 | Discharge: 2019-02-18 | Disposition: A | Discharge status: Home / Self Care | Payer: Medicaid Other | Attending: Emergency Medicine | Admitting: Emergency Medicine

## 2019-02-17 DIAGNOSIS — Y929 Unspecified place or not applicable: Secondary | ICD-10-CM | POA: Insufficient documentation

## 2019-02-17 DIAGNOSIS — S0990XA Unspecified injury of head, initial encounter: Secondary | ICD-10-CM | POA: Diagnosis not present

## 2019-02-17 DIAGNOSIS — F1410 Cocaine abuse, uncomplicated: Secondary | ICD-10-CM | POA: Insufficient documentation

## 2019-02-17 DIAGNOSIS — Y998 Other external cause status: Secondary | ICD-10-CM | POA: Insufficient documentation

## 2019-02-17 DIAGNOSIS — Y9389 Activity, other specified: Secondary | ICD-10-CM | POA: Diagnosis not present

## 2019-02-17 DIAGNOSIS — F1210 Cannabis abuse, uncomplicated: Secondary | ICD-10-CM | POA: Diagnosis not present

## 2019-02-17 DIAGNOSIS — R109 Unspecified abdominal pain: Secondary | ICD-10-CM | POA: Diagnosis not present

## 2019-02-17 DIAGNOSIS — R0789 Other chest pain: Secondary | ICD-10-CM | POA: Diagnosis not present

## 2019-02-17 DIAGNOSIS — M549 Dorsalgia, unspecified: Secondary | ICD-10-CM | POA: Diagnosis not present

## 2019-02-17 DIAGNOSIS — Z79899 Other long term (current) drug therapy: Secondary | ICD-10-CM | POA: Diagnosis not present

## 2019-02-17 NOTE — ED Notes (Signed)
Bed: WTR7 Expected date:  Expected time:  Means of arrival:  Comments: 
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
## 2019-02-18 ENCOUNTER — Emergency Department (HOSPITAL_COMMUNITY): Payer: Medicaid Other

## 2019-02-18 ENCOUNTER — Telehealth: Payer: Self-pay

## 2019-02-18 ENCOUNTER — Encounter (HOSPITAL_COMMUNITY): Payer: Self-pay

## 2019-02-18 LAB — CBC WITH DIFFERENTIAL/PLATELET
Abs Immature Granulocytes: 0.01 10*3/uL (ref 0.00–0.07)
Basophils Absolute: 0 10*3/uL (ref 0.0–0.1)
Basophils Relative: 1 %
Eosinophils Absolute: 0.1 10*3/uL (ref 0.0–0.5)
Eosinophils Relative: 2 %
HCT: 42.3 % (ref 36.0–46.0)
Hemoglobin: 14.1 g/dL (ref 12.0–15.0)
Immature Granulocytes: 0 %
Lymphocytes Relative: 53 %
Lymphs Abs: 1.7 10*3/uL (ref 0.7–4.0)
MCH: 32.6 pg (ref 26.0–34.0)
MCHC: 33.3 g/dL (ref 30.0–36.0)
MCV: 97.9 fL (ref 80.0–100.0)
Monocytes Absolute: 0.5 10*3/uL (ref 0.1–1.0)
Monocytes Relative: 16 %
Neutro Abs: 0.9 10*3/uL — ABNORMAL LOW (ref 1.7–7.7)
Neutrophils Relative %: 28 %
Platelets: 12 10*3/uL — CL (ref 150–400)
RBC: 4.32 MIL/uL (ref 3.87–5.11)
RDW: 17.7 % — ABNORMAL HIGH (ref 11.5–15.5)
WBC: 3.3 10*3/uL — ABNORMAL LOW (ref 4.0–10.5)
nRBC: 0 % (ref 0.0–0.2)

## 2019-02-18 LAB — COMPREHENSIVE METABOLIC PANEL
ALT: 27 U/L (ref 0–44)
AST: 88 U/L — ABNORMAL HIGH (ref 15–41)
Albumin: 3.7 g/dL (ref 3.5–5.0)
Alkaline Phosphatase: 172 U/L — ABNORMAL HIGH (ref 38–126)
Anion gap: 13 (ref 5–15)
BUN: 7 mg/dL (ref 6–20)
CO2: 21 mmol/L — ABNORMAL LOW (ref 22–32)
Calcium: 7.8 mg/dL — ABNORMAL LOW (ref 8.9–10.3)
Chloride: 109 mmol/L (ref 98–111)
Creatinine, Ser: 0.53 mg/dL (ref 0.44–1.00)
GFR calc Af Amer: >60 mL/min (ref >60)
GFR calc non Af Amer: >60 mL/min (ref >60)
Glucose, Bld: 107 mg/dL — ABNORMAL HIGH (ref 70–99)
Potassium: 3.7 mmol/L (ref 3.5–5.1)
Sodium: 143 mmol/L (ref 135–145)
Total Bilirubin: 0.7 mg/dL (ref 0.3–1.2)
Total Protein: 8.5 g/dL — ABNORMAL HIGH (ref 6.5–8.1)

## 2019-02-18 LAB — PROTIME-INR
INR: 1 (ref 0.8–1.2)
Prothrombin Time: 13.4 seconds (ref 11.4–15.2)

## 2019-02-18 LAB — ETHANOL: Alcohol, Ethyl (B): 440 mg/dL (ref <10)

## 2019-02-18 MED ORDER — IOHEXOL 300 MG/ML  SOLN
100.0000 mL | Freq: Once | INTRAMUSCULAR | Status: AC | PRN
  Administered 2019-02-18: 04:00:00 100 mL via INTRAVENOUS

## 2019-02-18 MED ORDER — ONDANSETRON HCL 4 MG/2ML IJ SOLN
4.0000 mg | Freq: Once | INTRAMUSCULAR | Status: AC
  Administered 2019-02-18: 4 mg via INTRAVENOUS
  Filled 2019-02-18: qty 2

## 2019-02-18 MED ORDER — SODIUM CHLORIDE (PF) 0.9 % IJ SOLN
INTRAMUSCULAR | Status: AC
  Filled 2019-02-18: qty 50

## 2019-02-18 NOTE — Telephone Encounter (Signed)
Patient is negative for the screening and will be coming in office for appointment
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
## 2019-02-18 NOTE — ED Notes (Signed)
Pt transported to CT ?

## 2019-02-18 NOTE — Discharge Instructions (Signed)
HEAD INJURY If any of the following occur notify your physician or go to the Hospital Emergency Department:  Increased drowsiness, stupor or loss of consciousness  Restlessness or convulsions (fits)  Paralysis in arms or legs  Temperature above 100 F  Vomiting  Severe headache  Blood or clear fluid dripping from the nose or ears  Stiffness of the neck  Dizziness or blurred vision  Pulsating pain in the eye  Unequal pupils of eye  Personality changes  Any other unusual symptoms PRECAUTIONS  Do not take tranquilizers, sedatives, narcotics or alcohol  Avoid aspirin. Use only acetaminophen (e.g. Tylenol) or ibuprofen (e.g. Advil) for relief of pain. Follow directions on the bottle for dosage.  Use ice packs for comfort  Getting plenty of rest and sleep helps the brain to heal. Do not try to do too much too fast. As you start to feel better, you can slowly and gradually return to your usual routine.  Avoid activities that are physically demanding (e.g., sports, heavy housecleaning, exercising) or require a lot of thinking or concentration (e.g., working on the computer, playing video games).  Ask your health care professional when you can safely drive a car, ride a bike, or operate heavy equipment. MEDICATIONS Use medications only as directed by your physician  Follow up with your primary care provider within 5-7 days for re-evaluation.

## 2019-02-18 NOTE — ED Notes (Signed)
Patient's boyfriend is on the way to pick her up, Herbie [place], [phone]

## 2019-02-18 NOTE — ED Notes (Signed)
Pt back in room from CT scans

## 2019-02-19 ENCOUNTER — Ambulatory Visit (INDEPENDENT_AMBULATORY_CARE_PROVIDER_SITE_OTHER): Payer: Medicaid Other | Admitting: Family Medicine

## 2019-02-19 ENCOUNTER — Other Ambulatory Visit: Payer: Self-pay

## 2019-02-19 ENCOUNTER — Encounter: Payer: Self-pay | Admitting: Family Medicine

## 2019-02-19 VITALS — BP 116/74 | HR 90 | Temp 98.5°F | Ht 60.0 in | Wt 130.0 lb

## 2019-02-19 DIAGNOSIS — Z09 Encounter for follow-up examination after completed treatment for conditions other than malignant neoplasm: Secondary | ICD-10-CM | POA: Diagnosis not present

## 2019-02-19 DIAGNOSIS — S0990XA Unspecified injury of head, initial encounter: Secondary | ICD-10-CM | POA: Diagnosis not present

## 2019-02-19 DIAGNOSIS — S0990XD Unspecified injury of head, subsequent encounter: Secondary | ICD-10-CM

## 2019-02-19 DIAGNOSIS — D696 Thrombocytopenia, unspecified: Secondary | ICD-10-CM | POA: Diagnosis not present

## 2019-02-19 DIAGNOSIS — F419 Anxiety disorder, unspecified: Secondary | ICD-10-CM

## 2019-02-19 DIAGNOSIS — G4700 Insomnia, unspecified: Secondary | ICD-10-CM

## 2019-02-19 DIAGNOSIS — F1019 Alcohol abuse with unspecified alcohol-induced disorder: Secondary | ICD-10-CM
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
## 2019-02-21 LAB — PREPARE PLATELET PHERESIS: Unit division: 0

## 2019-02-21 LAB — BPAM PLATELET PHERESIS
Blood Product Expiration Date: 202006142359
ISSUE DATE / TIME: 202006130632
Unit Type and Rh: 600

## 2019-02-21 MED ORDER — SODIUM CHLORIDE 0.9% IV SOLUTION: Freq: Once | INTRAVENOUS | Status: DC

## 2019-02-23 ENCOUNTER — Encounter (HOSPITAL_COMMUNITY): Payer: Medicaid Other

## 2019-03-22 ENCOUNTER — Ambulatory Visit: Payer: Self-pay | Admitting: Family Medicine

## 2019-03-24 ENCOUNTER — Ambulatory Visit: Payer: Self-pay | Admitting: Family Medicine

## 2019-04-02 ENCOUNTER — Other Ambulatory Visit: Payer: Self-pay

## 2019-04-02 ENCOUNTER — Inpatient Hospital Stay (HOSPITAL_COMMUNITY)
Admission: EM | Admit: 2019-04-02 | Discharge: 2019-04-13 | DRG: 432 | Disposition: A | Discharge status: Home Health | Payer: Medicaid Other | Attending: Internal Medicine | Admitting: Internal Medicine

## 2019-04-02 ENCOUNTER — Encounter (HOSPITAL_COMMUNITY): Payer: Self-pay

## 2019-04-02 ENCOUNTER — Emergency Department (HOSPITAL_COMMUNITY): Payer: Medicaid Other

## 2019-04-02 DIAGNOSIS — F1020 Alcohol dependence, uncomplicated: Secondary | ICD-10-CM | POA: Diagnosis present

## 2019-04-02 DIAGNOSIS — R42 Dizziness and giddiness: Secondary | ICD-10-CM

## 2019-04-02 DIAGNOSIS — F1014 Alcohol abuse with alcohol-induced mood disorder: Secondary | ICD-10-CM | POA: Diagnosis present

## 2019-04-02 DIAGNOSIS — Z79899 Other long term (current) drug therapy: Secondary | ICD-10-CM

## 2019-04-02 DIAGNOSIS — K5900 Constipation, unspecified: Secondary | ICD-10-CM | POA: Diagnosis present

## 2019-04-02 DIAGNOSIS — E871 Hypo-osmolality and hyponatremia: Secondary | ICD-10-CM | POA: Diagnosis present

## 2019-04-02 DIAGNOSIS — T148XXA Other injury of unspecified body region, initial encounter: Secondary | ICD-10-CM | POA: Diagnosis present

## 2019-04-02 DIAGNOSIS — R569 Unspecified convulsions: Secondary | ICD-10-CM

## 2019-04-02 DIAGNOSIS — K2971 Gastritis, unspecified, with bleeding: Secondary | ICD-10-CM | POA: Diagnosis present

## 2019-04-02 DIAGNOSIS — G629 Polyneuropathy, unspecified: Secondary | ICD-10-CM

## 2019-04-02 DIAGNOSIS — G40909 Epilepsy, unspecified, not intractable, without status epilepticus: Secondary | ICD-10-CM | POA: Diagnosis present

## 2019-04-02 DIAGNOSIS — K068 Other specified disorders of gingiva and edentulous alveolar ridge: Secondary | ICD-10-CM | POA: Diagnosis present

## 2019-04-02 DIAGNOSIS — F322 Major depressive disorder, single episode, severe without psychotic features: Secondary | ICD-10-CM | POA: Diagnosis present

## 2019-04-02 DIAGNOSIS — F209 Schizophrenia, unspecified: Secondary | ICD-10-CM | POA: Diagnosis present

## 2019-04-02 DIAGNOSIS — I8510 Secondary esophageal varices without bleeding: Secondary | ICD-10-CM | POA: Diagnosis present

## 2019-04-02 DIAGNOSIS — Z90710 Acquired absence of both cervix and uterus: Secondary | ICD-10-CM

## 2019-04-02 DIAGNOSIS — F1010 Alcohol abuse, uncomplicated: Secondary | ICD-10-CM

## 2019-04-02 DIAGNOSIS — F419 Anxiety disorder, unspecified: Secondary | ICD-10-CM | POA: Diagnosis present

## 2019-04-02 DIAGNOSIS — F1024 Alcohol dependence with alcohol-induced mood disorder: Secondary | ICD-10-CM

## 2019-04-02 DIAGNOSIS — Z9114 Patient's other noncompliance with medication regimen: Secondary | ICD-10-CM

## 2019-04-02 DIAGNOSIS — D6959 Other secondary thrombocytopenia: Secondary | ICD-10-CM | POA: Diagnosis present

## 2019-04-02 DIAGNOSIS — E876 Hypokalemia: Secondary | ICD-10-CM | POA: Diagnosis present

## 2019-04-02 DIAGNOSIS — R188 Other ascites: Secondary | ICD-10-CM

## 2019-04-02 DIAGNOSIS — F10239 Alcohol dependence with withdrawal, unspecified: Secondary | ICD-10-CM | POA: Diagnosis present

## 2019-04-02 DIAGNOSIS — I951 Orthostatic hypotension: Secondary | ICD-10-CM | POA: Diagnosis present

## 2019-04-02 DIAGNOSIS — Z811 Family history of alcohol abuse and dependence: Secondary | ICD-10-CM

## 2019-04-02 DIAGNOSIS — K7031 Alcoholic cirrhosis of liver with ascites: Principal | ICD-10-CM | POA: Diagnosis present

## 2019-04-02 DIAGNOSIS — Z20828 Contact with and (suspected) exposure to other viral communicable diseases: Secondary | ICD-10-CM | POA: Diagnosis present

## 2019-04-02 DIAGNOSIS — D696 Thrombocytopenia, unspecified: Secondary | ICD-10-CM | POA: Diagnosis not present

## 2019-04-02 DIAGNOSIS — B1920 Unspecified viral hepatitis C without hepatic coma: Secondary | ICD-10-CM | POA: Diagnosis present

## 2019-04-02 DIAGNOSIS — G4700 Insomnia, unspecified: Secondary | ICD-10-CM

## 2019-04-02 DIAGNOSIS — K219 Gastro-esophageal reflux disease without esophagitis: Secondary | ICD-10-CM | POA: Diagnosis present

## 2019-04-02 LAB — DIFFERENTIAL
Basophils Absolute: 0 10*3/uL (ref 0.0–0.1)
Basophils Relative: 1 %
Eosinophils Absolute: 0.1 10*3/uL (ref 0.0–0.5)
Eosinophils Relative: 2 %
Lymphocytes Relative: 39 %
Lymphs Abs: 1.6 10*3/uL (ref 0.7–4.0)
Monocytes Absolute: 0.7 10*3/uL (ref 0.1–1.0)
Monocytes Relative: 18 %
Neutro Abs: 1.6 10*3/uL — ABNORMAL LOW (ref 1.7–7.7)
Neutrophils Relative %: 40 %

## 2019-04-02 LAB — CBC
HCT: 43.5 % (ref 36.0–46.0)
Hemoglobin: 14 g/dL (ref 12.0–15.0)
MCH: 32 pg (ref 26.0–34.0)
MCHC: 32.2 g/dL (ref 30.0–36.0)
MCV: 99.5 fL (ref 80.0–100.0)
Platelets: 11 10*3/uL — CL (ref 150–400)
RBC: 4.37 MIL/uL (ref 3.87–5.11)
RDW: 18.3 % — ABNORMAL HIGH (ref 11.5–15.5)
WBC: 3.9 10*3/uL — ABNORMAL LOW (ref 4.0–10.5)
nRBC: 0 % (ref 0.0–0.2)

## 2019-04-02 LAB — PROTIME-INR
INR: 1.3 — ABNORMAL HIGH (ref 0.8–1.2)
Prothrombin Time: 16 seconds — ABNORMAL HIGH (ref 11.4–15.2)

## 2019-04-02 LAB — COMPREHENSIVE METABOLIC PANEL
ALT: 49 U/L — ABNORMAL HIGH (ref 0–44)
AST: 148 U/L — ABNORMAL HIGH (ref 15–41)
Albumin: 3.7 g/dL (ref 3.5–5.0)
Alkaline Phosphatase: 202 U/L — ABNORMAL HIGH (ref 38–126)
Anion gap: 19 — ABNORMAL HIGH (ref 5–15)
BUN: 7 mg/dL (ref 6–20)
CO2: 19 mmol/L — ABNORMAL LOW (ref 22–32)
Calcium: 9.4 mg/dL (ref 8.9–10.3)
Chloride: 96 mmol/L — ABNORMAL LOW (ref 98–111)
Creatinine, Ser: 0.72 mg/dL (ref 0.44–1.00)
GFR calc Af Amer: >60 mL/min (ref >60)
GFR calc non Af Amer: >60 mL/min (ref >60)
Glucose, Bld: 137 mg/dL — ABNORMAL HIGH (ref 70–99)
Potassium: 3.3 mmol/L — ABNORMAL LOW (ref 3.5–5.1)
Sodium: 134 mmol/L — ABNORMAL LOW (ref 135–145)
Total Bilirubin: 4.8 mg/dL — ABNORMAL HIGH (ref 0.3–1.2)
Total Protein: 8.9 g/dL — ABNORMAL HIGH (ref 6.5–8.1)

## 2019-04-02 LAB — URINALYSIS, ROUTINE W REFLEX MICROSCOPIC
Bilirubin Urine: NEGATIVE
Glucose, UA: NEGATIVE mg/dL
Hgb urine dipstick: NEGATIVE
Ketones, ur: NEGATIVE mg/dL
Leukocytes,Ua: NEGATIVE
Nitrite: NEGATIVE
Protein, ur: NEGATIVE mg/dL
Specific Gravity, Urine: 1.023 (ref 1.005–1.030)
pH: 7 (ref 5.0–8.0)

## 2019-04-02 LAB — TYPE AND SCREEN
ABO/RH(D): O POS
Antibody Screen: NEGATIVE

## 2019-04-02 LAB — I-STAT BETA HCG BLOOD, ED (MC, WL, AP ONLY): I-stat hCG, quantitative: <5 m[IU]/mL (ref <5)

## 2019-04-02 LAB — ETHANOL: Alcohol, Ethyl (B): <10 mg/dL (ref <10)

## 2019-04-02 LAB — PHENYTOIN LEVEL, TOTAL: Phenytoin Lvl: 4.4 ug/mL — ABNORMAL LOW (ref 10.0–20.0)

## 2019-04-02 LAB — SARS CORONAVIRUS 2 BY RT PCR (HOSPITAL ORDER, PERFORMED IN ~~LOC~~ HOSPITAL LAB): SARS Coronavirus 2: NEGATIVE

## 2019-04-02 LAB — LIPASE, BLOOD: Lipase: 62 U/L — ABNORMAL HIGH (ref 11–51)

## 2019-04-02 MED ORDER — VITAMIN B-1 100 MG PO TABS
100.0000 mg | ORAL_TABLET | Freq: Every day | ORAL | Status: DC
  Administered 2019-04-03 – 2019-04-13 (×11): 100 mg via ORAL
  Filled 2019-04-02 (×11): qty 1

## 2019-04-02 MED ORDER — DIPHENHYDRAMINE HCL 50 MG/ML IJ SOLN
25.0000 mg | Freq: Once | INTRAMUSCULAR | Status: AC
  Administered 2019-04-02: 25 mg via INTRAVENOUS
  Filled 2019-04-02: qty 1

## 2019-04-02 MED ORDER — LACTATED RINGERS IV BOLUS
1000.0000 mL | Freq: Once | INTRAVENOUS | Status: AC
  Administered 2019-04-02: 1000 mL via INTRAVENOUS

## 2019-04-02 MED ORDER — SODIUM CHLORIDE 0.9% FLUSH
3.0000 mL | Freq: Two times a day (BID) | INTRAVENOUS | Status: DC
  Administered 2019-04-02 – 2019-04-13 (×15): 3 mL via INTRAVENOUS

## 2019-04-02 MED ORDER — LORAZEPAM 2 MG/ML IJ SOLN
0.0000 mg | Freq: Four times a day (QID) | INTRAMUSCULAR | Status: AC
  Administered 2019-04-03: 1 mg via INTRAVENOUS
  Administered 2019-04-03 – 2019-04-04 (×4): 2 mg via INTRAVENOUS
  Filled 2019-04-02 (×5): qty 1

## 2019-04-02 MED ORDER — IOHEXOL 300 MG/ML  SOLN
100.0000 mL | Freq: Once | INTRAMUSCULAR | Status: AC | PRN
  Administered 2019-04-02: 100 mL via INTRAVENOUS

## 2019-04-02 MED ORDER — SODIUM CHLORIDE 0.9 % IV SOLN: 250.0000 mL | INTRAVENOUS | Status: DC | PRN

## 2019-04-02 MED ORDER — SODIUM CHLORIDE 0.9 % IV SOLN
8.0000 mg/h | INTRAVENOUS | Status: DC
  Administered 2019-04-03 – 2019-04-04 (×4): 8 mg/h via INTRAVENOUS
  Filled 2019-04-02 (×6): qty 80

## 2019-04-02 MED ORDER — SODIUM CHLORIDE (PF) 0.9 % IJ SOLN
INTRAMUSCULAR | Status: AC
  Administered 2019-04-02: 18:00:00
  Filled 2019-04-02: qty 50

## 2019-04-02 MED ORDER — LORAZEPAM 1 MG PO TABS: 1.0000 mg | ORAL_TABLET | Freq: Four times a day (QID) | ORAL | Status: AC | PRN

## 2019-04-02 MED ORDER — DIPHENHYDRAMINE HCL 50 MG/ML IJ SOLN
25.0000 mg | Freq: Four times a day (QID) | INTRAMUSCULAR | Status: DC | PRN
  Administered 2019-04-02 – 2019-04-12 (×4): 25 mg via INTRAVENOUS
  Filled 2019-04-02 (×4): qty 1

## 2019-04-02 MED ORDER — LORAZEPAM 2 MG/ML IJ SOLN
1.0000 mg | Freq: Four times a day (QID) | INTRAMUSCULAR | Status: AC | PRN
  Administered 2019-04-04: 1 mg via INTRAVENOUS
  Filled 2019-04-02 (×2): qty 1

## 2019-04-02 MED ORDER — LORAZEPAM 2 MG/ML IJ SOLN
0.0000 mg | Freq: Two times a day (BID) | INTRAMUSCULAR | Status: AC
  Administered 2019-04-04 – 2019-04-05 (×3): 2 mg via INTRAVENOUS
  Administered 2019-04-06: 1 mg via INTRAVENOUS
  Filled 2019-04-02 (×4): qty 1

## 2019-04-02 MED ORDER — ADULT MULTIVITAMIN W/MINERALS CH: 1.0000 | ORAL_TABLET | Freq: Every day | ORAL | Status: DC

## 2019-04-02 MED ORDER — SODIUM CHLORIDE 0.9% FLUSH
3.0000 mL | Freq: Once | INTRAVENOUS | Status: AC
  Administered 2019-04-02: 3 mL via INTRAVENOUS

## 2019-04-02 MED ORDER — SODIUM CHLORIDE 0.9% FLUSH: 3.0000 mL | INTRAVENOUS | Status: DC | PRN

## 2019-04-02 MED ORDER — FOLIC ACID 1 MG PO TABS
1.0000 mg | ORAL_TABLET | Freq: Every day | ORAL | Status: DC
  Filled 2019-04-02: qty 1

## 2019-04-02 MED ORDER — METOCLOPRAMIDE HCL 5 MG/ML IJ SOLN
10.0000 mg | Freq: Once | INTRAMUSCULAR | Status: AC
  Administered 2019-04-02: 10 mg via INTRAVENOUS
  Filled 2019-04-02: qty 2

## 2019-04-02 MED ORDER — FLUOXETINE HCL 20 MG PO CAPS
40.0000 mg | ORAL_CAPSULE | Freq: Every day | ORAL | Status: DC
  Administered 2019-04-03 – 2019-04-13 (×11): 40 mg via ORAL
  Filled 2019-04-02 (×11): qty 2

## 2019-04-02 MED ORDER — CHLORDIAZEPOXIDE HCL 25 MG PO CAPS
50.0000 mg | ORAL_CAPSULE | Freq: Once | ORAL | Status: AC
  Administered 2019-04-02: 50 mg via ORAL
  Filled 2019-04-02: qty 2

## 2019-04-02 MED ORDER — TOPIRAMATE 25 MG PO TABS
50.0000 mg | ORAL_TABLET | Freq: Every day | ORAL | Status: DC
  Administered 2019-04-02 – 2019-04-13 (×12): 50 mg via ORAL
  Filled 2019-04-02 (×12): qty 2

## 2019-04-02 MED ORDER — HYDROXYZINE HCL 25 MG PO TABS
25.0000 mg | ORAL_TABLET | Freq: Four times a day (QID) | ORAL | Status: DC | PRN
  Administered 2019-04-03 – 2019-04-13 (×16): 25 mg via ORAL
  Filled 2019-04-02 (×16): qty 1

## 2019-04-02 MED ORDER — GABAPENTIN 300 MG PO CAPS
600.0000 mg | ORAL_CAPSULE | Freq: Two times a day (BID) | ORAL | Status: DC
  Administered 2019-04-02 – 2019-04-13 (×22): 600 mg via ORAL
  Filled 2019-04-02 (×22): qty 2

## 2019-04-02 MED ORDER — TRAZODONE HCL 100 MG PO TABS
100.0000 mg | ORAL_TABLET | Freq: Every day | ORAL | Status: DC
  Administered 2019-04-02 – 2019-04-12 (×11): 100 mg via ORAL
  Filled 2019-04-02 (×11): qty 1

## 2019-04-02 MED ORDER — PANTOPRAZOLE SODIUM 40 MG IV SOLR
40.0000 mg | Freq: Once | INTRAVENOUS | Status: AC
  Administered 2019-04-02: 40 mg via INTRAVENOUS
  Filled 2019-04-02: qty 40

## 2019-04-02 MED ORDER — LACTULOSE 10 GM/15ML PO SOLN
20.0000 g | Freq: Three times a day (TID) | ORAL | Status: DC
  Administered 2019-04-02 – 2019-04-13 (×31): 20 g via ORAL
  Filled 2019-04-02 (×32): qty 30

## 2019-04-02 MED ORDER — CHLORDIAZEPOXIDE HCL 25 MG PO CAPS
25.0000 mg | ORAL_CAPSULE | Freq: Once | ORAL | Status: AC
  Administered 2019-04-03: 25 mg via ORAL
  Filled 2019-04-02: qty 1

## 2019-04-02 MED ORDER — FOLIC ACID 1 MG PO TABS
1.0000 mg | ORAL_TABLET | Freq: Every day | ORAL | Status: DC
  Administered 2019-04-02 – 2019-04-13 (×12): 1 mg via ORAL
  Filled 2019-04-02 (×12): qty 1

## 2019-04-02 MED ORDER — ADULT MULTIVITAMIN W/MINERALS CH
1.0000 | ORAL_TABLET | Freq: Every day | ORAL | Status: DC
  Administered 2019-04-02 – 2019-04-13 (×12): 1 via ORAL
  Filled 2019-04-02 (×12): qty 1

## 2019-04-02 MED ORDER — SODIUM CHLORIDE 0.9 % IV SOLN: 10.0000 mL/h | Freq: Once | INTRAVENOUS | Status: DC

## 2019-04-02 MED ORDER — ONDANSETRON HCL 4 MG/2ML IJ SOLN: 4.0000 mg | Freq: Four times a day (QID) | INTRAMUSCULAR | Status: DC | PRN

## 2019-04-02 MED ORDER — PHENYTOIN SODIUM EXTENDED 100 MG PO CAPS
300.0000 mg | ORAL_CAPSULE | Freq: Every day | ORAL | Status: DC
  Administered 2019-04-03 – 2019-04-13 (×11): 300 mg via ORAL
  Filled 2019-04-02 (×11): qty 3

## 2019-04-02 MED ORDER — THIAMINE HCL 100 MG/ML IJ SOLN
100.0000 mg | Freq: Every day | INTRAMUSCULAR | Status: DC
  Administered 2019-04-02: 100 mg via INTRAVENOUS
  Filled 2019-04-02: qty 2

## 2019-04-02 NOTE — ED Notes (Signed)
Pt significant other called and alerted pt that his COVID test came back positive today, so she has been at risk of exposure.
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
## 2019-04-02 NOTE — ED Provider Notes (Signed)
[facility] DEPT Provider Note   CSN: [id number] Arrival date & time: 04/02/19  1447    History   Chief Complaint Chief Complaint  Patient presents with   Hematemesis   Dizziness   Abdominal Pain    HPI Briana Sweeney is a 48 y.o. female.     HPI  48 year old female presents with multiple complaints.  The patient states she has been feeling poorly for about a week.  Has been having headache, dizziness.  She states the dizziness is so bad she feels like she cannot walk.  She is a chronic alcoholic, drinking about 12 beers per day and last drink yesterday.  For the last 3 days has had vomiting and diarrhea.  Yesterday she had a couple episodes of coffee grounds and blood in her emesis.  3 days ago she had blood in her diarrhea and her stools were dark yesterday.  She is also having lower abdominal pain for about a week.  She states she has a history of low platelets that is attributed to her alcohol abuse.  She is been having numerous atraumatic or minimally traumatic bruises.  She has a bruise on her forehead that she states was not from an injury. She feels like she's going to have a seizure from the lights. Has been compliant with her dilantin.  Past Medical History:  Diagnosis Date   Anxiety    Bipolar affective disorder ([place])    With anxiety features   Cirrhosis of liver ([place])    Due to alcohol and hepatitis C   Depression    Esophageal 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 (HCC)    ETOHism (HCC)    GERD (gastroesophageal reflux disease)    Hematemesis 02/10/2018   Hepatitis C 2018   hepatitis c and alcohol related hepatitis   Heroin abuse ([place])    History of blood transfusion    "blood doesn't clot; I fell down and had to have a transfusion"   History of kidney stones    Migraine    "when I get really stressed" (09/01/2017)   Schizophrenia ([place])    Seizures ([place])    "when I run out of my RX; lots recently" (09/01/2017)    Patient  Active Problem List   Diagnosis Date Noted   Dizziness 04/02/2019   Bruising 04/02/2019   Hyponatremia 04/02/2019   Head injury    Scalp laceration, initial encounter    Fall 01/20/2019   Epistaxis 01/20/2019   Altered mental status    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   Anxiety 10/27/2018   Neuropathy 10/27/2018   Abscess of bursa of left elbow 09/23/2018   Olecranon bursitis of left elbow    Erysipelas 53/29/9242   Acute metabolic encephalopathy 68/34/1962   Overdose 08/22/2018   Hypotension 08/22/2018   Seizure ([place]) 08/22/2018   Substance induced mood disorder ([place]) 07/17/2018   Cocaine dependence without complication ([place]) 22/97/9892   Polysubstance abuse ([place]) 04/08/2018   Encephalopathy, 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/08/2018   Hepatitis C 03/18/2018   Portal hypertension ([place]) 11/94/1740   Alcoholic intoxication without complication ([place]) 81/44/8185   Depression 03/18/2018   Upper GI bleed 02/10/2018   Alcohol withdrawal, with unspecified complication ([place]) 63/14/9702   Chronic anemia    Thrombocytopenia due to drugs    Suicidal ideation    Thrombocytopenia ([place]) 01/02/2018   GERD (gastroesophageal reflux disease) 11/01/2017   Trichimoniasis 10/30/2017   Alcohol dependence ([place]) 10/29/2017   MDD (major depressive disorder), severe ([place]) 10/28/2017   Acute  hyperactive alcohol withdrawal delirium ([place]) 10/09/2017   Schizophrenia ([place]) 10/09/2017   Alcohol abuse with alcohol-induced mood disorder ([place])    Alcohol withdrawal syndrome with complication ([place]) 76/16/0737   Esophageal varices without bleeding ([place])    Hematemesis 09/01/2017   Ascites due to alcoholic cirrhosis ([place])    Decompensated hepatic cirrhosis ([place]) 07/23/2017   Alcohol abuse 07/23/2017   Hepatitis C antibody positive in blood 07/23/2017   Hypokalemia 07/23/2017   Jaundice 07/23/2017   Coagulopathy ([place]) 07/23/2017   Hypomagnesemia  07/23/2017   Pancytopenia ([place]) 10/62/6948   Alcoholic cirrhosis of liver with ascites ([place]) 07/23/2017   Bacterial vaginosis 06/03/2017   UTI (urinary tract infection) 06/02/2017    Past Surgical History:  Procedure Laterality Date   ESOPHAGOGASTRODUODENOSCOPY N/A 09/03/2017   Procedure: ESOPHAGOGASTRODUODENOSCOPY (EGD);  Surgeon: Doran Stabler, MD;  Location: [facility];  Service: Gastroenterology;  Laterality: N/A;   FINGER FRACTURE SURGERY Left    "shattered my pinky"   FRACTURE SURGERY     I&D EXTREMITY Left 09/18/2018   Procedure: IRRIGATION AND DEBRIDEMENT EXTREMITY;  Surgeon: Leanora Cover, MD;  Location: [facility];  Service: Orthopedics;  Laterality: Left;   IR PARACENTESIS  07/23/2017   IR PARACENTESIS  07/2017   "did it twice in the same week" (09/01/2017)   SHOULDER OPEN ROTATOR CUFF REPAIR Right    TUBAL LIGATION     VAGINAL HYSTERECTOMY       OB History   No obstetric history on file.      Home Medications    Prior to Admission medications   Medication Sig Start Date End Date Taking? Authorizing Provider  famotidine (PEPCID) 20 MG tablet Take 1 tablet (20 mg total) by mouth daily. 09/23/18  Yes Eugenie Filler, MD  FLUoxetine (PROZAC) 40 MG capsule Take 1 capsule (40 mg total) by mouth daily. 12/23/18  Yes [name], FNP  folic acid (FOLVITE) 1 MG tablet Take 1 tablet (1 mg total) by mouth daily. 12/23/18  Yes [name], FNP  gabapentin (NEURONTIN) 300 MG capsule Take 2 capsules 2 times a day. Patient taking differently: 600 mg 2 (two) times daily.  12/23/18  Yes [name], FNP  haloperidol (HALDOL) 2 MG tablet Take 1 tablet (2 mg total) by mouth at bedtime. 12/23/18  Yes [name], FNP  hydrOXYzine (ATARAX/VISTARIL) 25 MG tablet Take 1 tablet (25 mg total) by mouth every 6 (six) hours as needed for itching. Patient taking differently: Take 25 mg by mouth every 6 (six) hours as needed for anxiety.  10/07/18  Yes  [name], FNP  lactulose (CHRONULAC) 10 GM/15ML solution Take 30 mLs (20 g total) by mouth 3 (three) times daily. Goal to have 2-3 BMs daily 12/14/18  Yes Isabelle Course, MD  Multiple Vitamin (MULTIVITAMIN WITH MINERALS) TABS tablet Take 1 tablet by mouth daily. 08/26/18  Yes Hongalgi, Lenis Dickinson, MD  topiramate (TOPAMAX) 50 MG tablet Take 1 tablet (50 mg total) by mouth daily. 10/20/18  Yes [name], FNP  traZODone (DESYREL) 50 MG tablet Take 2 tablets (100 mg total) by mouth at bedtime. 12/23/18  Yes [name], FNP  naltrexone (DEPADE) 50 MG tablet Take 1 tablet (50 mg total) by mouth daily. Patient not taking: Reported on 04/02/2019 12/14/18   Isabelle Course, MD  phenytoin (DILANTIN) 300 MG ER capsule Take 1 capsule (300 mg total) by mouth daily. Patient not taking: Reported on 02/19/2019 12/23/18   Kathe Becton  M, FNP  thiamine 100 MG tablet Take 1 tablet (100 mg total) by mouth daily. Patient not taking: Reported on 04/02/2019 12/23/18   [name], FNP    Family History Family History  Problem Relation Age of Onset   Lung cancer Mother 67   Alcohol abuse Mother    Throat cancer Father 80    Social History Social History   Tobacco Use   Smoking status: Never Smoker   Smokeless tobacco: Never Used  Substance Use Topics   Alcohol use: Yes    Alcohol/week: 63.0 standard drinks    Types: 63 Cans of beer per week    Comment: weekly "I have cut back"   Drug use: Yes    Types: Marijuana, Cocaine     Allergies   Patient has no known allergies.   Review of Systems Review of Systems  Constitutional: Negative for fever.  Gastrointestinal: Positive for abdominal pain, blood in stool, diarrhea and vomiting.  Neurological: Positive for dizziness, light-headedness and headaches.  Hematological: Bruises/bleeds easily.  All other systems reviewed and are negative.    Physical Exam Updated Vital Signs BP 113/88    Pulse 87    Temp 98 F (36.7 C)  (Oral)    Resp 20    Ht 5' (1.524 m)    Wt 59 kg    SpO2 96%    BMI 25.39 kg/m   Physical Exam Vitals signs and nursing note reviewed.  Constitutional:      General: She is not in acute distress.    Appearance: She is well-developed. She is not ill-appearing or diaphoretic.  HENT:     Head: Normocephalic.      Right Ear: External ear normal.     Left Ear: External ear normal.     Nose: Nose normal.  Eyes:     General:        Right eye: No discharge.        Left eye: No discharge.     Extraocular Movements: Extraocular movements intact.     Pupils: Pupils are equal, round, and reactive to light.  Cardiovascular:     Rate and Rhythm: Normal rate and regular rhythm.     Heart sounds: Normal heart sounds.  Pulmonary:     Effort: Pulmonary effort is normal.     Breath sounds: Normal breath sounds.  Abdominal:     Palpations: Abdomen is soft.     Tenderness: There is abdominal tenderness (mild) in the right lower quadrant and left lower quadrant.  Skin:    General: Skin is warm and dry.     Comments: Numerous bruises, especially extremities. Perhaps mild petechiae in left dorsal foot  Neurological:     Mental Status: She is alert.     Comments: CN 3-12 grossly intact. 5/5 strength in all 4 extremities. Grossly normal sensation. Normal finger to nose.   Psychiatric:        Mood and Affect: Mood is not anxious.      ED Treatments / Results  Labs (all labs ordered are listed, but only abnormal results are displayed) Labs Reviewed  LIPASE, BLOOD - Abnormal; Notable for the following components:      Result Value   Lipase 62 (*)    All other components within normal limits  COMPREHENSIVE METABOLIC PANEL - Abnormal; Notable for the following components:   Sodium 134 (*)    Potassium 3.3 (*)    Chloride 96 (*)    CO2 19 (*)  Glucose, Bld 137 (*)    Total Protein 8.9 (*)    AST 148 (*)    ALT 49 (*)    Alkaline Phosphatase 202 (*)    Total Bilirubin 4.8 (*)    Anion  gap 19 (*)    All other components within normal limits  CBC - Abnormal; Notable for the following components:   WBC 3.9 (*)    RDW 18.3 (*)    Platelets 11 (*)    All other components within normal limits  DIFFERENTIAL - Abnormal; Notable for the following components:   Neutro Abs 1.6 (*)    All other components within normal limits  PROTIME-INR - Abnormal; Notable for the following components:   Prothrombin Time 16.0 (*)    INR 1.3 (*)    All other components within normal limits  SARS CORONAVIRUS 2 (HOSPITAL ORDER, [facility])  URINALYSIS, ROUTINE W REFLEX MICROSCOPIC  ETHANOL  PHENYTOIN LEVEL, TOTAL  I-STAT BETA HCG BLOOD, ED ([facility], [facility], [facility] ONLY)  TYPE AND SCREEN  PREPARE PLATELET PHERESIS    EKG None  Radiology Ct Head Wo Contrast  Result Date: 04/02/2019 CLINICAL DATA:  Pt report that she has low platelets of 12, and states it is d/t her ETOH use pt reports that she was told on her last visit here, Pt reports that she has been having increased dizziness, HA, poor appetite, bruising easily. EXAM: CT HEAD WITHOUT CONTRAST TECHNIQUE: Contiguous axial images were obtained from the base of the skull through the vertex without intravenous contrast. COMPARISON:  02/03/2019 FINDINGS: Brain: There is no intra or extra-axial fluid collection or mass lesion. The basilar cisterns and ventricles have a normal appearance. There is no CT evidence for acute infarction or hemorrhage. Stable small calcification within the LEFT cerebellum. Vascular: No hyperdense vessel or unexpected calcification. Skull: Normal. Negative for fracture or focal lesion. Sinuses/Orbits: No acute finding. Other: None IMPRESSION: No evidence for acute abnormality. Electronically Signed   By: Nolon Nations M.D.   On: 04/02/2019 17:51   Ct Abdomen Pelvis W Contrast  Result Date: 04/02/2019 CLINICAL DATA:  Nausea, vomiting, multiple falls EXAM: CT ABDOMEN AND PELVIS WITH CONTRAST TECHNIQUE:  Multidetector CT imaging of the abdomen and pelvis was performed using the standard protocol following bolus administration of intravenous contrast. CONTRAST:  135mL OMNIPAQUE IOHEXOL 300 MG/ML  SOLN COMPARISON:  02/18/2019 FINDINGS: Lower chest: Heterogeneous and ground-glass opacity of the left lung base (series 4, image 24), increased compared to prior examination dated 02/18/2019. Numerous esophageal varices (series 2, image 12). Hepatobiliary: Severe hepatic steatosis, with a somewhat coarse contour of the liver. Gallstones. No gallbladder wall thickening, or biliary dilatation. Pancreas: Coarse calcifications of the pancreatic uncinate. No pancreatic ductal dilatation or surrounding inflammatory changes. Spleen: Normal in size without significant abnormality. Adrenals/Urinary Tract: Adrenal glands are unremarkable. Kidneys are normal, without renal calculi, solid lesion, or hydronephrosis. Bladder is unremarkable. Stomach/Bowel: Stomach is within normal limits. Appendix appears normal. 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 Vascular/Lymphatic: No significant vascular findings are present. No enlarged abdominal or pelvic lymph nodes. Reproductive: Numerous bilateral ovarian and uterine varices. The left ovarian vein is enlarged, measuring 1.4 cm at the confluence with the left renal vein. Other: No abdominal wall hernia or abnormality. No abdominopelvic ascites. Musculoskeletal: No acute or significant osseous findings. IMPRESSION: 1.  No acute CT abnormality of the abdomen or pelvis. 2. Heterogeneous and ground-glass opacity  of the left lung base (series 4, image 24), increased compared to prior examination dated 02/18/2019, concerning for infection or aspiration. 3.  Stigmata of cirrhosis with abdominal and pelvic varices. 4. Thickening of the ascending and proximal transverse colon, similar to prior examination and likely  due to portal colopathy. Electronically Signed   By: Eddie Candle M.D.   On: 04/02/2019 17:46    Procedures .Critical Care Performed by: Sherwood Gambler, MD Authorized by: Sherwood Gambler, MD   Critical care provider statement:    Critical care time (minutes):  30   Critical care time was exclusive of:  Separately billable procedures and treating other patients   Critical care was necessary to treat or prevent imminent or life-threatening deterioration of the following conditions:  Circulatory failure   Critical care was time spent personally by me on the following activities:  Discussions with consultants, evaluation of patient's response to treatment, examination of patient, ordering and performing treatments and interventions, ordering and review of laboratory studies, ordering and review of radiographic studies, pulse oximetry, re-evaluation of patient's condition, obtaining history from patient or surrogate and review of old charts   (including critical care time)  Medications Ordered in ED Medications  0.9 %  sodium chloride infusion (has no administration in time range)  chlordiazePOXIDE (LIBRIUM) capsule 50 mg (has no administration in time range)  chlordiazePOXIDE (LIBRIUM) capsule 25 mg (has no administration in time range)  LORazepam (ATIVAN) tablet 1 mg (has no administration in time range)    Or  LORazepam (ATIVAN) injection 1 mg (has no administration in time range)  thiamine (VITAMIN B-1) tablet 100 mg (has no administration in time range)    Or  thiamine (B-1) injection 100 mg (has no administration in time range)  folic acid (FOLVITE) tablet 1 mg (has no administration in time range)  multivitamin with minerals tablet 1 tablet (has no administration in time range)  LORazepam (ATIVAN) injection 0-4 mg (has no administration in time range)    Followed by  LORazepam (ATIVAN) injection 0-4 mg (has no administration in time range)  sodium chloride flush (NS) 0.9 %  injection 3 mL (3 mLs Intravenous Given 04/02/19 1554)  lactated ringers bolus 1,000 mL (1,000 mLs Intravenous New Bag/Given 04/02/19 1753)  metoCLOPramide (REGLAN) injection 10 mg (10 mg Intravenous Given 04/02/19 1752)  diphenhydrAMINE (BENADRYL) injection 25 mg (25 mg Intravenous Given 04/02/19 1751)  pantoprazole (PROTONIX) injection 40 mg (40 mg Intravenous Given 04/02/19 1751)  iohexol (OMNIPAQUE) 300 MG/ML solution 100 mL (100 mLs Intravenous Contrast Given 04/02/19 1718)  sodium chloride (PF) 0.9 % injection (  Given 04/02/19 1753)     Initial Impression / Assessment and Plan / ED Course  I have reviewed the triage vital signs and the nursing notes.  Pertinent labs & imaging results that were available during my care of the patient were reviewed by me and considered in my medical decision making (see chart for details).        No obvious bleeding at this time.  The patient reports hematemesis though not today.  Hemoglobin is stable.  Platelets are low but also fairly stable to her baseline.  I discussed with Dr. Marin Olp, given her reports of bleeding and bruising and the level, he recommends transfusing a unit of platelets.  I think because of this is reasonable to observe her for no further bleeding.  Dr. Maudie Mercury to admit.  Briana Sweeney was evaluated in Emergency Department on 04/02/2019 for the symptoms described in the history  of present illness. She was evaluated in the context of the global COVID-19 pandemic, which necessitated consideration that the patient might be at risk for infection with the SARS-CoV-2 virus that causes COVID-19. Institutional protocols and algorithms that pertain to the evaluation of patients at risk for COVID-19 are in a state of rapid change based on information released by regulatory bodies including the CDC and federal and state organizations. These policies and algorithms were followed during the patient's care in the ED.   Final Clinical Impressions(s) / ED  Diagnoses   Final diagnoses:  Thrombocytopenia [facility])    ED Discharge Orders    None       Sherwood Gambler, MD 04/02/19 2032

## 2019-04-02 NOTE — ED Notes (Signed)
Pt placed on a purewick 

## 2019-04-02 NOTE — ED Notes (Signed)
Patient transported to CT 

## 2019-04-02 NOTE — H&P (Addendum)
TRH H&P    Patient Demographics:    Briana Sweeney, is a 48 y.o. female  MRN: [id number]  DOB - 11/01/1970  Admit Date - 04/02/2019  Referring MD/NP/PA:   Sherwood Sweeney  Outpatient Primary MD for the patient is Briana Glatter, FNP  Patient coming from:  home  Chief complaint-  bruising   HPI:    Analycia Sweeney  is a 48 y.o. female,   polysubstance abuse, chronic alcoholism, hepatitis C, liver cirrhosis w h/o ascites, chronic pancytopenia, bipolar disorder with anxiety features w recent admission 01/20/2019 for fall and etoh use.    Pt c/o easy bruising, dizziness, and fall 4 days ago where she hit her head on bathtub.  Pt also notes coffee ground emesis yesterday x4.  Pt states that she was trying to drink and just kept throwing up.  Pt notes slight brbpr 1 week ago.  "not too much". " just happens from time to time"  Denies fever, chills, cough, cp, palp, sob, abd pain, diarrhea, brbpr today.   Review of records: EGD 09/03/2017 Grade II esophageal varices. - Portal hypertensive gastropathy. - Normal examined duodenum. - No specimens collected.  Paracentesis 07/19/2017  In ED,  T 98 P 115 , currently 86  R 18, Bp 122/99  Pox 96% RA Wt 59kg  CT brain IMPRESSION: No evidence for acute abnormality.  CT abd/ pelvis Hepatobiliary: Severe hepatic steatosis, with a somewhat coarse contour of the liver. Gallstones. No gallbladder wall thickening, or biliary dilatation.  Pancreas: Coarse calcifications of the pancreatic uncinate. No pancreatic ductal dilatation or surrounding inflammatory changes.  IMPRESSION: 1.  No acute CT abnormality of the abdomen or pelvis.  2. Heterogeneous and ground-glass opacity of the left lung base (series 4, image 24), increased compared to prior examination dated 02/18/2019, concerning for infection or aspiration.  3.  Stigmata of cirrhosis with abdominal and  pelvic varices.  4. 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  Wbc 3.9, hgb 14.0, Plt 11  Na 134, K 3.3, Bun 7, Creatinine 0.72 Ast 148, Alt 49, Alk phos 202, T. Bili 4.8 INR 1.3  Glucose 137, Alb 3.7  Lipase 62  Urinalysis negative  Covid-19 negative  ED states that they discussed this case with hematology (Briana Sweeney) and he recommended a unit of platelets  Pt will be admitted for bruising secondary to thrombocytopenia secondary to etoh use, and cirrhosis, and also  hypokalemia, hyponatremia,          Review of systems:    In addition to the HPI above,  No Fever-chills, No Headache, No changes with Vision or hearing, No problems swallowing food or Liquids, No Chest pain, Cough or Shortness of Breath, No Abdominal pain,  No Blood in Urine, No dysuria, No new skin rashes or bruises, No new joints pains-aches,  No new weakness, tingling, numbness in any extremity, No recent weight gain or loss, No polyuria, polydypsia or polyphagia, No significant Mental Stressors.  All other systems reviewed and  are negative.    Past History of the following :    Past Medical History:  Diagnosis Date   Anxiety    Bipolar affective disorder ([place])    With anxiety features   Cirrhosis of liver ([place])    Due to alcohol and hepatitis C   Depression    Esophageal 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 (HCC)    ETOHism (HCC)    GERD (gastroesophageal reflux disease)    Hematemesis 02/10/2018   Hepatitis C 2018   hepatitis c and alcohol related hepatitis   Heroin abuse ([place])    History of blood transfusion    "blood doesn't clot; I fell down and had to have a transfusion"   History of kidney stones    Migraine    "when I get really stressed" (09/01/2017)   Schizophrenia ([place])    Seizures ([place])    "when I run out of my RX; lots recently" (09/01/2017)      Past Surgical History:  Procedure  Laterality Date   ESOPHAGOGASTRODUODENOSCOPY N/A 09/03/2017   Procedure: ESOPHAGOGASTRODUODENOSCOPY (EGD);  Surgeon: Briana Stabler, MD;  Location: [facility];  Service: Gastroenterology;  Laterality: N/A;   FINGER FRACTURE SURGERY Left    "shattered my pinky"   FRACTURE SURGERY     I&D EXTREMITY Left 09/18/2018   Procedure: IRRIGATION AND DEBRIDEMENT EXTREMITY;  Surgeon: Briana Cover, MD;  Location: [facility];  Service: Orthopedics;  Laterality: Left;   IR PARACENTESIS  07/23/2017   IR PARACENTESIS  07/2017   "did it twice in the same week" (09/01/2017)   SHOULDER OPEN ROTATOR CUFF REPAIR Right    TUBAL LIGATION     VAGINAL HYSTERECTOMY        Social History:      Social History   Tobacco Use   Smoking status: Never Smoker   Smokeless tobacco: Never Used  Substance Use Topics   Alcohol use: Yes    Alcohol/week: 63.0 standard drinks    Types: 63 Cans of beer per week    Comment: weekly "I have cut back"       Family History :     Family History  Problem Relation Age of Onset   Lung cancer Mother 81   Alcohol abuse Mother    Throat cancer Father 67       Home Medications:   Prior to Admission medications   Medication Sig Start Date End Date Taking? Authorizing Provider  famotidine (PEPCID) 20 MG tablet Take 1 tablet (20 mg total) by mouth daily. 09/23/18  Yes Briana Filler, MD  FLUoxetine (PROZAC) 40 MG capsule Take 1 capsule (40 mg total) by mouth daily. 12/23/18  Yes Briana Glatter, FNP  folic acid (FOLVITE) 1 MG tablet Take 1 tablet (1 mg total) by mouth daily. 12/23/18  Yes Briana Glatter, FNP  gabapentin (NEURONTIN) 300 MG capsule Take 2 capsules 2 times a day. Patient taking differently: 600 mg 2 (two) times daily.  12/23/18  Yes Briana Glatter, FNP  haloperidol (HALDOL) 2 MG tablet Take 1 tablet (2 mg total) by mouth at bedtime. 12/23/18  Yes Briana Glatter, FNP  hydrOXYzine (ATARAX/VISTARIL) 25 MG tablet Take 1 tablet (25 mg  total) by mouth every 6 (six) hours as needed for itching. Patient taking differently: Take 25 mg by mouth every 6 (six) hours as needed for anxiety.  10/07/18  Yes Briana Glatter, FNP  lactulose (CHRONULAC) 10 GM/15ML solution Take 30 mLs (20 g total) by mouth 3 (  three) times daily. Goal to have 2-3 BMs daily 12/14/18  Yes Briana Course, MD  Multiple Vitamin (MULTIVITAMIN WITH MINERALS) TABS tablet Take 1 tablet by mouth daily. 08/26/18  Yes Hongalgi, Lenis Dickinson, MD  topiramate (TOPAMAX) 50 MG tablet Take 1 tablet (50 mg total) by mouth daily. 10/20/18  Yes Briana Glatter, FNP  traZODone (DESYREL) 50 MG tablet Take 2 tablets (100 mg total) by mouth at bedtime. 12/23/18  Yes Briana Glatter, FNP  naltrexone (DEPADE) 50 MG tablet Take 1 tablet (50 mg total) by mouth daily. Patient not taking: Reported on 04/02/2019 12/14/18   Briana Course, MD  phenytoin (DILANTIN) 300 MG ER capsule Take 1 capsule (300 mg total) by mouth daily. Patient not taking: Reported on 02/19/2019 12/23/18   Briana Glatter, FNP  thiamine 100 MG tablet Take 1 tablet (100 mg total) by mouth daily. Patient not taking: Reported on 04/02/2019 12/23/18   Briana Glatter, FNP     Allergies:    No Known Allergies   Physical Exam:   Vitals  Blood pressure 116/86, pulse 99, temperature 98 F (36.7 C), temperature source Oral, resp. rate 17, height 5' (1.524 m), weight 59 kg, SpO2 98 %.  1.  General: axoxo3  2. Psychiatric: euthymic  3. Neurologic: cn2-12 intact, reflexes 2+ symmetric, diffuse with no clonus, motor 5/5 in all 4 ext  4. HEENMT:  Icteric, pupils 1.61m symmetric, direct, consensual, near intact Neck: no jvd no bruit  5. Respiratory : CTAB  6. Cardiovascular : rrr s1, s2, no m/g/r  7. Gastrointestinal:  Abd: soft, slightly distended, nt, +bs  8. Skin:  + palmar erythema, no asterixis, multiple tatoos Ext: no c/c/e,    9.Musculoskeletal:  Good  ROM,  No adenopathy    Data Review:     CBC Recent Labs  Lab 04/02/19 1608  WBC 3.9*  HGB 14.0  HCT 43.5  PLT 11*  MCV 99.5  MCH 32.0  MCHC 32.2  RDW 18.3*  LYMPHSABS 1.6  MONOABS 0.7  EOSABS 0.1  BASOSABS 0.0   ------------------------------------------------------------------------------------------------------------------  Results for orders placed or performed during the hospital encounter of 04/02/19 (from the past 48 hour(s))  I-Stat beta hCG blood, ED     Status: None   Collection Time: 04/02/19  4:07 PM  Result Value Ref Range   I-stat hCG, quantitative <5.0 <5 mIU/mL   Comment 3            Comment:   GEST. AGE      CONC.  (mIU/mL)   <=1 WEEK        5 - 50     2 WEEKS       50 - 500     3 WEEKS       100 - 10,000     4 WEEKS     1,000 - 30,000        FEMALE AND NON-PREGNANT FEMALE:     LESS THAN 5 mIU/mL   Lipase, blood     Status: Abnormal   Collection Time: 04/02/19  4:08 PM  Result Value Ref Range   Lipase 62 (H) 11 - 51 U/L    Comment: Performed at [facility] [address] [place] [address] Comprehensive metabolic panel     Status: Abnormal   Collection Time: 04/02/19  4:08 PM  Result Value Ref Range   Sodium 134 (L) 135 - 145 mmol/L   Potassium 3.3 (L)  3.5 - 5.1 mmol/L   Chloride 96 (L) 98 - 111 mmol/L   CO2 19 (L) 22 - 32 mmol/L   Glucose, Bld 137 (H) 70 - 99 mg/dL   BUN 7 6 - 20 mg/dL   Creatinine, Ser 0.72 0.44 - 1.00 mg/dL   Calcium 9.4 8.9 - 10.3 mg/dL   Total Protein 8.9 (H) 6.5 - 8.1 g/dL   Albumin 3.7 3.5 - 5.0 g/dL   AST 148 (H) 15 - 41 U/L   ALT 49 (H) 0 - 44 U/L   Alkaline Phosphatase 202 (H) 38 - 126 U/L   Total Bilirubin 4.8 (H) 0.3 - 1.2 mg/dL   GFR calc non Af Amer >60 >60 mL/min   GFR calc Af Amer >60 >60 mL/min   Anion gap 19 (H) 5 - 15    Comment: Performed at [facility], [place] [address], [place], [place] [address]  CBC     Status: Abnormal   Collection Time: 04/02/19  4:08 PM  Result Value Ref Range   WBC 3.9  (L) 4.0 - 10.5 K/uL   RBC 4.37 3.87 - 5.11 MIL/uL   Hemoglobin 14.0 12.0 - 15.0 g/dL   HCT 43.5 36.0 - 46.0 %   MCV 99.5 80.0 - 100.0 fL   MCH 32.0 26.0 - 34.0 pg   MCHC 32.2 30.0 - 36.0 g/dL   RDW 18.3 (H) 11.5 - 15.5 %   Platelets 11 (LL) 150 - 400 K/uL    Comment: REPEATED TO VERIFY PLATELET COUNT CONFIRMED BY SMEAR SPECIMEN CHECKED FOR CLOTS Immature Platelet Fraction may be clinically indicated, consider ordering this additional test [id number] CRITICAL RESULT CALLED TO, READ BACK BY AND VERIFIED WITH: Tsosie Billing [id number] @ 1636 BY J SCOTTON    nRBC 0.0 0.0 - 0.2 %    Comment: Performed at [facility], [place] [address], [place], [place] [address]  Differential     Status: Abnormal   Collection Time: 04/02/19  4:08 PM  Result Value Ref Range   Neutrophils Relative % 40 %   Neutro Abs 1.6 (L) 1.7 - 7.7 K/uL   Lymphocytes Relative 39 %   Lymphs Abs 1.6 0.7 - 4.0 K/uL   Monocytes Relative 18 %   Monocytes Absolute 0.7 0.1 - 1.0 K/uL   Eosinophils Relative 2 %   Eosinophils Absolute 0.1 0.0 - 0.5 K/uL   Basophils Relative 1 %   Basophils Absolute 0.0 0.0 - 0.1 K/uL    Comment: Performed at [facility], [place] [address], [place], [place] [address]  Protime-INR     Status: Abnormal   Collection Time: 04/02/19  4:08 PM  Result Value Ref Range   Prothrombin Time 16.0 (H) 11.4 - 15.2 seconds   INR 1.3 (H) 0.8 - 1.2    Comment: (NOTE) INR goal varies based on device and disease states. Performed at [facility], [place] [address] [place], [place] [address]   Urinalysis, Routine w reflex microscopic     Status: None   Collection Time: 04/02/19  5:52 PM  Result Value Ref Range   Color, Urine YELLOW YELLOW   APPearance CLEAR CLEAR   Specific Gravity, Urine 1.023 1.005 - 1.030   pH 7.0 5.0 - 8.0   Glucose, UA NEGATIVE NEGATIVE mg/dL   Hgb urine dipstick NEGATIVE NEGATIVE   Bilirubin Urine NEGATIVE NEGATIVE   Ketones,  ur NEGATIVE NEGATIVE mg/dL   Protein, ur NEGATIVE NEGATIVE mg/dL   Nitrite  NEGATIVE NEGATIVE   Leukocytes,Ua NEGATIVE NEGATIVE    Comment: Performed at [place] [address], [place], [place] [address]  SARS Coronavirus 2 (CEPHEID - Performed in [place] hospital lab), Hosp Order     Status: None   Collection Time: 04/02/19  5:52 PM   Specimen: Urine, Clean Catch; Nasopharyngeal  Result Value Ref Range   SARS Coronavirus 2 NEGATIVE NEGATIVE    Comment: (NOTE) If result is NEGATIVE SARS-CoV-2 target nucleic acids are NOT DETECTED. The SARS-CoV-2 RNA is generally detectable in upper and lower  respiratory specimens during the acute phase of infection. The lowest  concentration of SARS-CoV-2 viral copies this assay can detect is 250  copies / mL. A negative result does not preclude SARS-CoV-2 infection  and should not be used as the sole basis for treatment or other  patient management decisions.  A negative result may occur with  improper specimen collection / handling, submission of specimen other  than nasopharyngeal swab, presence of viral mutation(s) within the  areas targeted by this assay, and inadequate number of viral copies  (<250 copies / mL). A negative result must be combined with clinical  observations, patient history, and epidemiological information. If result is POSITIVE SARS-CoV-2 target nucleic acids are DETECTED. The SARS-CoV-2 RNA is generally detectable in upper and lower  respiratory specimens dur ing the acute phase of infection.  Positive  results are indicative of active infection with SARS-CoV-2.  Clinical  correlation with patient history and other diagnostic information is  necessary to determine patient infection status.  Positive results do  not rule out bacterial infection or co-infection with other viruses. If result is PRESUMPTIVE POSTIVE SARS-CoV-2 nucleic acids MAY BE PRESENT.   A presumptive positive result was obtained  on the submitted specimen  and confirmed on repeat testing.  While 2019 novel coronavirus  (SARS-CoV-2) nucleic acids may be present in the submitted sample  additional confirmatory testing may be necessary for epidemiological  and / or clinical management purposes  to differentiate between  SARS-CoV-2 and other Sarbecovirus currently known to infect humans.  If clinically indicated additional testing with an alternate test  methodology [phone]) is advised. The SARS-CoV-2 RNA is generally  detectable in upper and lower respiratory sp ecimens during the acute  phase of infection. The expected result is Negative. Fact Sheet for Patients:  StrictlyIdeas.no Fact Sheet for Healthcare Providers: BankingDealers.co.za This test is not yet approved or cleared by the [place] FDA and has been authorized for detection and/or diagnosis of SARS-CoV-2 by FDA under an Emergency Use Authorization (EUA).  This EUA will remain in effect (meaning this test can be used) for the duration of the COVID-19 declaration under Section 564(b)(1) of the Act, 21 U.S.C. section 360bbb-3(b)(1), unless the authorization is terminated or revoked sooner. Performed at [facility], [place] [address], [place], [place] [address]     Chemistries  Recent Labs  Lab 04/02/19 1608  NA 134*  K 3.3*  CL 96*  CO2 19*  GLUCOSE 137*  BUN 7  CREATININE 0.72  CALCIUM 9.4  AST 148*  ALT 49*  ALKPHOS 202*  BILITOT 4.8*   ------------------------------------------------------------------------------------------------------------------  ------------------------------------------------------------------------------------------------------------------ GFR: Estimated Creatinine Clearance: 70.6 mL/min (by C-G formula based on SCr of 0.72 mg/dL). Liver Function Tests: Recent Labs  Lab 04/02/19 1608  AST 148*  ALT 49*  ALKPHOS 202*  BILITOT 4.8*  PROT  8.9*  ALBUMIN 3.7   Recent Labs  Lab 04/02/19 1608  LIPASE 62*   No results for input(s): AMMONIA in the last 168 hours. Coagulation Profile: Recent Labs  Lab 04/02/19 1608  INR 1.3*   Cardiac Enzymes: No results for input(s): CKTOTAL, CKMB, CKMBINDEX, TROPONINI in the last 168 hours. BNP (last 3 results) No results for input(s): PROBNP in the last 8760 hours. HbA1C: No results for input(s): HGBA1C in the last 72 hours. CBG: No results for input(s): GLUCAP in the last 168 hours. Lipid Profile: No results for input(s): CHOL, HDL, LDLCALC, TRIG, CHOLHDL, LDLDIRECT in the last 72 hours. Thyroid Function Tests: No results for input(s): TSH, T4TOTAL, FREET4, T3FREE, THYROIDAB in the last 72 hours. Anemia Panel: No results for input(s): VITAMINB12, FOLATE, FERRITIN, TIBC, IRON, RETICCTPCT in the last 72 hours.  --------------------------------------------------------------------------------------------------------------- Urine analysis:    Component Value Date/Time   COLORURINE YELLOW 04/02/2019 1752   APPEARANCEUR CLEAR 04/02/2019 1752   LABSPEC 1.023 04/02/2019 1752   PHURINE 7.0 04/02/2019 1752   GLUCOSEU NEGATIVE 04/02/2019 1752   HGBUR NEGATIVE 04/02/2019 1752   BILIRUBINUR NEGATIVE 04/02/2019 1752   BILIRUBINUR negative 10/23/2018 1302   BILIRUBINUR neg 02/20/2018 1248   KETONESUR NEGATIVE 04/02/2019 1752   PROTEINUR NEGATIVE 04/02/2019 1752   UROBILINOGEN 1.0 10/23/2018 1302   UROBILINOGEN 0.2 12/02/2017 0836   NITRITE NEGATIVE 04/02/2019 1752   LEUKOCYTESUR NEGATIVE 04/02/2019 1752      Imaging Results:    Ct Head Wo Contrast  Result Date: 04/02/2019 CLINICAL DATA:  Pt report that she has low platelets of 12, and states it is d/t her ETOH use pt reports that she was told on her last visit here, Pt reports that she has been having increased dizziness, HA, poor appetite, bruising easily. EXAM: CT HEAD WITHOUT CONTRAST TECHNIQUE: Contiguous axial images were  obtained from the base of the skull through the vertex without intravenous contrast. COMPARISON:  02/03/2019 FINDINGS: Brain: There is no intra or extra-axial fluid collection or mass lesion. The basilar cisterns and ventricles have a normal appearance. There is no CT evidence for acute infarction or hemorrhage. Stable small calcification within the LEFT cerebellum. Vascular: No hyperdense vessel or unexpected calcification. Skull: Normal. Negative for fracture or focal lesion. Sinuses/Orbits: No acute finding. Other: None IMPRESSION: No evidence for acute abnormality. Electronically Signed   By: Nolon Nations M.D.   On: 04/02/2019 17:51   Ct Abdomen Pelvis W Contrast  Result Date: 04/02/2019 CLINICAL DATA:  Nausea, vomiting, multiple falls EXAM: CT ABDOMEN AND PELVIS WITH CONTRAST TECHNIQUE: Multidetector CT imaging of the abdomen and pelvis was performed using the standard protocol following bolus administration of intravenous contrast. CONTRAST:  154m OMNIPAQUE IOHEXOL 300 MG/ML  SOLN COMPARISON:  02/18/2019 FINDINGS: Lower chest: Heterogeneous and ground-glass opacity of the left lung base (series 4, image 24), increased compared to prior examination dated 02/18/2019. Numerous esophageal varices (series 2, image 12). Hepatobiliary: Severe hepatic steatosis, with a somewhat coarse contour of the liver. Gallstones. No gallbladder wall thickening, or biliary dilatation. Pancreas: Coarse calcifications of the pancreatic uncinate. No pancreatic ductal dilatation or surrounding inflammatory changes. Spleen: Normal in size without significant abnormality. Adrenals/Urinary Tract: Adrenal glands are unremarkable. Kidneys are normal, without renal calculi, solid lesion, or hydronephrosis. Bladder is unremarkable. Stomach/Bowel: Stomach is within normal limits. Appendix appears normal. 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  Vascular/Lymphatic: No significant vascular findings are present. No enlarged abdominal or pelvic lymph nodes. Reproductive: Numerous bilateral ovarian and uterine varices. The left ovarian  vein is enlarged, measuring 1.4 cm at the confluence with the left renal vein. Other: No abdominal wall hernia or abnormality. No abdominopelvic ascites. Musculoskeletal: No acute or significant osseous findings. IMPRESSION: 1.  No acute CT abnormality of the abdomen or pelvis. 2. Heterogeneous and ground-glass opacity of the left lung base (series 4, image 24), increased compared to prior examination dated 02/18/2019, concerning for infection or aspiration. 3.  Stigmata of cirrhosis with abdominal and pelvic varices. 4. 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 Electronically Signed   By: Eddie Candle M.D.   On: 04/02/2019 17:46       Assessment & Plan:    Principal Problem:   Thrombocytopenia ([place]) Active Problems:   Hypokalemia   Alcoholic cirrhosis of liver with ascites (HCC)   Alcohol abuse with alcohol-induced mood disorder (HCC)   MDD (major depressive disorder), severe (HCC)   Alcohol dependence ([place])   Dizziness   Bruising   Hyponatremia  Bruising secondary to thrombocytopenia (chronic), baseline about 10-70) 1 units of plt ordered by ED Check cbc in am  Dizziness CT brain (04/02/2019) negative Tele Check orthostatic bp  H/o Coffee Ground Emesis  NPO except for medication Protonix '40mg'$  iv x1, then '8mg'$  /hr Consider GI consultation in AM   Hypokalemia Replete Check magnesium in am Check cmp in am  Hyponatremia likely secondary to cirrhosis Check cmp in am  ETOH dep w hx of etoh withdrawal Librium '50mg'$  po qday x 1 days then '25mg'$  po qday x 1 day CIWA  H/o Cirrhosis secondary to ETOH/ Hep C ?, with esophageal varices and h/o ascites Cont Lactulose  Gerd  Stop Pepcid, PPI as above  Seizure  disorder, noncompliant with medication Dilantin level pending but expect to be low Restart on dilantin '300mg'$  po qday  H/o migraines Cont Topiramate  H/o Depression/ Bipolar affective do Cont Gabapentin '600mg'$  po bid Cont Prozac '40mg'$  po qday Cont Trazodone '100mg'$  po qhs   DVT Prophylaxis-  SCDs   AM Labs Ordered, also please review Full Orders  Family Communication: Admission, patients condition and plan of care including tests being ordered have been discussed with the patient  who indicate understanding and agree with the plan and Code Status.  Code Status:  FULL CODE,  Notified her significant other that she was being admitted to [facility] and tested negative for covid-19  Admission status: Observation: Based on patients clinical presentation and evaluation of above clinical data, I have made determination that patient meets observation criteria at this time.    Time spent in minutes : 70   Jani Gravel M.D on 04/02/2019 at 7:35 PM

## 2019-04-02 NOTE — ED Notes (Signed)
ED TO INPATIENT HANDOFF REPORT  ED Nurse Name and Phone #: jon wled   S Name/Age/Gender Manfred Shirts 48 y.o. female Room/Bed: WA16/WA16  Code Status   Code Status: Prior  Home/SNF/Other Home Patient oriented to: self, place, time and situation Is this baseline? Yes   Triage Complete: Triage complete  Chief Complaint seizures; dizziness; emesis; diarrhea  Triage Note Pt report that she has low platelets of 12, and states it is d/t her ethol use pt reports that she was told on her last visit here, Pt reports that she has been having increased dizziness, HA, poor appetite, bruising easily , and reports having multiple falls, Denies any injury from fall. Pt reports that her last fall was last week. Pt denies any ETHOl use today. Pt reports that she has been having bloody emesis last night, and bloody stools 3 days ago.    **pt reports that she has hx of sz and feel like she is going to have a SZ.    Allergies No Known Allergies  Level of Care/Admitting Diagnosis ED Disposition    ED Disposition Condition Comment   Admit  Hospital Area: [facility] [[id number]]  Level of Care: Telemetry [5]  Admit to tele based on following criteria: Monitor for Ischemic changes  Covid Evaluation: Confirmed COVID Negative  Diagnosis: Thrombocytopenia [facility]) [[id number]]  Admitting Physician: Jani Gravel [[id number]]  Attending Physician: Jani Gravel [[id number]]  PT Class (Do Not Modify): Observation [104]  PT Acc Code (Do Not Modify): Observation [10022]       B Medical/Surgery History Past Medical History:  Diagnosis Date  . Anxiety   . Bipolar affective disorder ([place])    With anxiety features  . 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 Depression   . 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 ETOHism ([place])   . GERD (gastroesophageal reflux disease)   . Hematemesis 02/10/2018  . Hepatitis C 2018   hepatitis c and alcohol related hepatitis  . Heroin abuse  ([place])   . History of blood transfusion    "blood doesn't clot; I fell down and had to have a transfusion"  . History of kidney stones   . Migraine    "when I get really stressed" (09/01/2017)  . Schizophrenia ([place])   . Seizures ([place])    "when I run out of my RX; lots recently" (09/01/2017)   Past Surgical History:  Procedure Laterality Date  . ESOPHAGOGASTRODUODENOSCOPY N/A 09/03/2017   Procedure: ESOPHAGOGASTRODUODENOSCOPY (EGD);  Surgeon: Doran Stabler, MD;  Location: [facility];  Service: Gastroenterology;  Laterality: N/A;  . FINGER FRACTURE SURGERY Left    "shattered my pinky"  . FRACTURE SURGERY    . I&D EXTREMITY Left 09/18/2018   Procedure: IRRIGATION AND DEBRIDEMENT EXTREMITY;  Surgeon: Leanora Cover, MD;  Location: [facility];  Service: Orthopedics;  Laterality: Left;  . IR PARACENTESIS  07/23/2017  . IR PARACENTESIS  07/2017   "did it twice in the same week" (09/01/2017)  . SHOULDER OPEN ROTATOR CUFF REPAIR Right   . TUBAL LIGATION    . VAGINAL HYSTERECTOMY       A IV Location/Drains/Wounds Patient Lines/Drains/Airways Status   Active Line/Drains/Airways    Name:   Placement date:   Placement time:   Site:   Days:   Peripheral IV 04/02/19 Right Hand   04/02/19    1553    Hand   less than 1   Incision (Closed)  09/18/18 Elbow Left   09/18/18    1827     196   Wound / Incision (Open or Dehisced) 09/21/18 Incision - Open Elbow Left;Lateral   09/21/18    1130    Elbow   193   Wound / Incision (Open or Dehisced) 01/20/19 Laceration Head Posterior laceration from fall   01/20/19    2330    Head   72          Intake/Output Last 24 hours No intake or output data in the 24 hours ending 04/02/19 2014  Labs/Imaging Results for orders placed or performed during the hospital encounter of 04/02/19 (from the past 48 hour(s))  I-Stat beta hCG blood, ED     Status: None   Collection Time: 04/02/19  4:07 PM  Result Value Ref Range   I-stat hCG, quantitative <5.0 <5 mIU/mL    Comment 3            Comment:   GEST. AGE      CONC.  (mIU/mL)   <=1 WEEK        5 - 50     2 WEEKS       50 - 500     3 WEEKS       100 - 10,000     4 WEEKS     1,000 - 30,000        FEMALE AND NON-PREGNANT FEMALE:     LESS THAN 5 mIU/mL   Lipase, blood     Status: Abnormal   Collection Time: 04/02/19  4:08 PM  Result Value Ref Range   Lipase 62 (H) 11 - 51 U/L    Comment: Performed at [facility], [place] [address] [place], [place] [address]  Comprehensive metabolic panel     Status: Abnormal   Collection Time: 04/02/19  4:08 PM  Result Value Ref Range   Sodium 134 (L) 135 - 145 mmol/L   Potassium 3.3 (L) 3.5 - 5.1 mmol/L   Chloride 96 (L) 98 - 111 mmol/L   CO2 19 (L) 22 - 32 mmol/L   Glucose, Bld 137 (H) 70 - 99 mg/dL   BUN 7 6 - 20 mg/dL   Creatinine, Ser 0.72 0.44 - 1.00 mg/dL   Calcium 9.4 8.9 - 10.3 mg/dL   Total Protein 8.9 (H) 6.5 - 8.1 g/dL   Albumin 3.7 3.5 - 5.0 g/dL   AST 148 (H) 15 - 41 U/L   ALT 49 (H) 0 - 44 U/L   Alkaline Phosphatase 202 (H) 38 - 126 U/L   Total Bilirubin 4.8 (H) 0.3 - 1.2 mg/dL   GFR calc non Af Amer >60 >60 mL/min   GFR calc Af Amer >60 >60 mL/min   Anion gap 19 (H) 5 - 15    Comment: Performed at [facility], [place] [address] [place], [place] [address]  CBC     Status: Abnormal   Collection Time: 04/02/19  4:08 PM  Result Value Ref Range   WBC 3.9 (L) 4.0 - 10.5 K/uL   RBC 4.37 3.87 - 5.11 MIL/uL   Hemoglobin 14.0 12.0 - 15.0 g/dL   HCT 43.5 36.0 - 46.0 %   MCV 99.5 80.0 - 100.0 fL   MCH 32.0 26.0 - 34.0 pg   MCHC 32.2 30.0 - 36.0 g/dL   RDW 18.3 (H) 11.5 - 15.5 %   Platelets 11 (LL) 150 - 400 K/uL    Comment: REPEATED TO  VERIFY PLATELET COUNT CONFIRMED BY SMEAR SPECIMEN CHECKED FOR CLOTS Immature Platelet Fraction may be clinically indicated, consider ordering this additional test [id number] CRITICAL RESULT CALLED TO, READ BACK BY AND VERIFIED WITH: Tsosie Billing [id number] @ 1636 BY J  SCOTTON    nRBC 0.0 0.0 - 0.2 %    Comment: Performed at [facility], [place] [address], [place], [place] [address]  Differential     Status: Abnormal   Collection Time: 04/02/19  4:08 PM  Result Value Ref Range   Neutrophils Relative % 40 %   Neutro Abs 1.6 (L) 1.7 - 7.7 K/uL   Lymphocytes Relative 39 %   Lymphs Abs 1.6 0.7 - 4.0 K/uL   Monocytes Relative 18 %   Monocytes Absolute 0.7 0.1 - 1.0 K/uL   Eosinophils Relative 2 %   Eosinophils Absolute 0.1 0.0 - 0.5 K/uL   Basophils Relative 1 %   Basophils Absolute 0.0 0.0 - 0.1 K/uL    Comment: Performed at [facility], [place] [address] [place], [place] [address]  Protime-INR     Status: Abnormal   Collection Time: 04/02/19  4:08 PM  Result Value Ref Range   Prothrombin Time 16.0 (H) 11.4 - 15.2 seconds   INR 1.3 (H) 0.8 - 1.2    Comment: (NOTE) INR goal varies based on device and disease states. Performed at [facility], [place] [address], [place], [place] [address]   Urinalysis, Routine w reflex microscopic     Status: None   Collection Time: 04/02/19  5:52 PM  Result Value Ref Range   Color, Urine YELLOW YELLOW   APPearance CLEAR CLEAR   Specific Gravity, Urine 1.023 1.005 - 1.030   pH 7.0 5.0 - 8.0   Glucose, UA NEGATIVE NEGATIVE mg/dL   Hgb urine dipstick NEGATIVE NEGATIVE   Bilirubin Urine NEGATIVE NEGATIVE   Ketones, ur NEGATIVE NEGATIVE mg/dL   Protein, ur NEGATIVE NEGATIVE mg/dL   Nitrite NEGATIVE NEGATIVE   Leukocytes,Ua NEGATIVE NEGATIVE    Comment: Performed at [facility] [address], [place],  [address]  SARS Coronavirus 2 (CEPHEID - Performed in [place] hospital lab), Hosp Order     Status: None   Collection Time: 04/02/19  5:52 PM   Specimen: Urine, Clean Catch; Nasopharyngeal  Result Value Ref Range   SARS Coronavirus 2 NEGATIVE NEGATIVE    Comment: (NOTE) If result is NEGATIVE SARS-CoV-2 target nucleic acids are  NOT DETECTED. The SARS-CoV-2 RNA is generally detectable in upper and lower  respiratory specimens during the acute phase of infection. The lowest  concentration of SARS-CoV-2 viral copies this assay can detect is 250  copies / mL. A negative result does not preclude SARS-CoV-2 infection  and should not be used as the sole basis for treatment or other  patient management decisions.  A negative result may occur with  improper specimen collection / handling, submission of specimen other  than nasopharyngeal swab, presence of viral mutation(s) within the  areas targeted by this assay, and inadequate number of viral copies  (<250 copies / mL). A negative result must be combined with clinical  observations, patient history, and epidemiological information. If result is POSITIVE SARS-CoV-2 target nucleic acids are DETECTED. The SARS-CoV-2 RNA is generally detectable in upper and lower  respiratory specimens dur ing the acute phase of infection.  Positive  results are indicative of active infection with SARS-CoV-2.  Clinical  correlation with patient history and other diagnostic  information is  necessary to determine patient infection status.  Positive results do  not rule out bacterial infection or co-infection with other viruses. If result is PRESUMPTIVE POSTIVE SARS-CoV-2 nucleic acids MAY BE PRESENT.   A presumptive positive result was obtained on the submitted specimen  and confirmed on repeat testing.  While 2019 novel coronavirus  (SARS-CoV-2) nucleic acids may be present in the submitted sample  additional confirmatory testing may be necessary for epidemiological  and / or clinical management purposes  to differentiate between  SARS-CoV-2 and other Sarbecovirus currently known to infect humans.  If clinically indicated additional testing with an alternate test  methodology [phone]) is advised. The SARS-CoV-2 RNA is generally  detectable in upper and lower respiratory sp ecimens  during the acute  phase of infection. The expected result is Negative. Fact Sheet for Patients:  StrictlyIdeas.no Fact Sheet for Healthcare Providers: BankingDealers.co.za This test is not yet approved or cleared by the [place] FDA and has been authorized for detection and/or diagnosis of SARS-CoV-2 by FDA under an Emergency Use Authorization (EUA).  This EUA will remain in effect (meaning this test can be used) for the duration of the COVID-19 declaration under Section 564(b)(1) of the Act, 21 U.S.C. section 360bbb-3(b)(1), unless the authorization is terminated or revoked sooner. Performed at [facility], [place] [address], [place], [place] [address]    Ct Head Wo Contrast  Result Date: 04/02/2019 CLINICAL DATA:  Pt report that she has low platelets of 12, and states it is d/t her ETOH use pt reports that she was told on her last visit here, Pt reports that she has been having increased dizziness, HA, poor appetite, bruising easily. EXAM: CT HEAD WITHOUT CONTRAST TECHNIQUE: Contiguous axial images were obtained from the base of the skull through the vertex without intravenous contrast. COMPARISON:  02/03/2019 FINDINGS: Brain: There is no intra or extra-axial fluid collection or mass lesion. The basilar cisterns and ventricles have a normal appearance. There is no CT evidence for acute infarction or hemorrhage. Stable small calcification within the LEFT cerebellum. Vascular: No hyperdense vessel or unexpected calcification. Skull: Normal. Negative for fracture or focal lesion. Sinuses/Orbits: No acute finding. Other: None IMPRESSION: No evidence for acute abnormality. Electronically Signed   By: Nolon Nations M.D.   On: 04/02/2019 17:51   Ct Abdomen Pelvis W Contrast  Result Date: 04/02/2019 CLINICAL DATA:  Nausea, vomiting, multiple falls EXAM: CT ABDOMEN AND PELVIS WITH CONTRAST TECHNIQUE: Multidetector CT imaging of  the abdomen and pelvis was performed using the standard protocol following bolus administration of intravenous contrast. CONTRAST:  130mL OMNIPAQUE IOHEXOL 300 MG/ML  SOLN COMPARISON:  02/18/2019 FINDINGS: Lower chest: Heterogeneous and ground-glass opacity of the left lung base (series 4, image 24), increased compared to prior examination dated 02/18/2019. Numerous esophageal varices (series 2, image 12). Hepatobiliary: Severe hepatic steatosis, with a somewhat coarse contour of the liver. Gallstones. No gallbladder wall thickening, or biliary dilatation. Pancreas: Coarse calcifications of the pancreatic uncinate. No pancreatic ductal dilatation or surrounding inflammatory changes. Spleen: Normal in size without significant abnormality. Adrenals/Urinary Tract: Adrenal glands are unremarkable. Kidneys are normal, without renal calculi, solid lesion, or hydronephrosis. Bladder is unremarkable. Stomach/Bowel: Stomach is within normal limits. Appendix appears normal. 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 Vascular/Lymphatic: No significant vascular findings are present. No enlarged abdominal or pelvic lymph nodes. Reproductive: Numerous bilateral ovarian and uterine varices. The left ovarian vein is  enlarged, measuring 1.4 cm at the confluence with the left renal vein. Other: No abdominal wall hernia or abnormality. No abdominopelvic ascites. Musculoskeletal: No acute or significant osseous findings. IMPRESSION: 1.  No acute CT abnormality of the abdomen or pelvis. 2. Heterogeneous and ground-glass opacity of the left lung base (series 4, image 24), increased compared to prior examination dated 02/18/2019, concerning for infection or aspiration. 3.  Stigmata of cirrhosis with abdominal and pelvic varices. 4. 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  Electronically Signed   By: Eddie Candle M.D.   On: 04/02/2019 17:46    Pending Labs Unresulted Labs (From admission, onward)    Start     Ordered   04/02/19 1909  Type and screen  Once,   STAT     04/02/19 1908   04/02/19 1909  Prepare Pheresed Platelets  (Adult Blood Adminstration - Platelets (Pheresed))  Once,   R    Question Answer Comment  Number of Apheresis Units 1 unit (6-10 packs)   Transfusion Indications Bleeding from Thrombocytopenia   Date and time of surgery none      04/02/19 1908   04/02/19 1644  Phenytoin level, total  Add-on,   AD     04/02/19 1644   04/02/19 1632  Ethanol  Add-on,   AD     04/02/19 1631          Vitals/Pain Today's Vitals   04/02/19 1800 04/02/19 1830 04/02/19 1845 04/02/19 1945  BP: 114/77 116/86  113/88  Pulse:    86  Resp: 19 17 17 20   Temp:      TempSrc:      SpO2: 97% 100% 98% 96%  Weight:      Height:      PainSc:        Isolation Precautions No active isolations  Medications Medications  0.9 %  sodium chloride infusion (has no administration in time range)  chlordiazePOXIDE (LIBRIUM) capsule 50 mg (has no administration in time range)  chlordiazePOXIDE (LIBRIUM) capsule 25 mg (has no administration in time range)  LORazepam (ATIVAN) tablet 1 mg (has no administration in time range)    Or  LORazepam (ATIVAN) injection 1 mg (has no administration in time range)  thiamine (VITAMIN B-1) tablet 100 mg (has no administration in time range)    Or  thiamine (B-1) injection 100 mg (has no administration in time range)  folic acid (FOLVITE) tablet 1 mg (has no administration in time range)  multivitamin with minerals tablet 1 tablet (has no administration in time range)  LORazepam (ATIVAN) injection 0-4 mg (has no administration in time range)    Followed by  LORazepam (ATIVAN) injection 0-4 mg (has no administration in time range)  sodium chloride flush (NS) 0.9 % injection 3 mL (3 mLs Intravenous Given 04/02/19 1554)   lactated ringers bolus 1,000 mL (1,000 mLs Intravenous New Bag/Given 04/02/19 1753)  metoCLOPramide (REGLAN) injection 10 mg (10 mg Intravenous Given 04/02/19 1752)  diphenhydrAMINE (BENADRYL) injection 25 mg (25 mg Intravenous Given 04/02/19 1751)  pantoprazole (PROTONIX) injection 40 mg (40 mg Intravenous Given 04/02/19 1751)  iohexol (OMNIPAQUE) 300 MG/ML solution 100 mL (100 mLs Intravenous Contrast Given 04/02/19 1718)  sodium chloride (PF) 0.9 % injection (  Given 04/02/19 1753)    Mobility walks Low fall risk / medium to high fall risk etoh related issues  Focused Assessments    R Recommendations: See Admitting Provider Note  Report given to:   Additional Notes:  Pt to be given platelets

## 2019-04-02 NOTE — ED Triage Notes (Signed)
Pt report that she has low platelets of 12, and states it is d/t her ethol use pt reports that she was told on her last visit here, Pt reports that she has been having increased dizziness, HA, poor appetite, bruising easily , and reports having multiple falls, Denies any injury from fall. Pt reports that her last fall was last week. Pt denies any ETHOl use today. Pt reports that she has been having bloody emesis last night, and bloody stools 3 days ago.    **pt reports that she has hx of sz and feel like she is going to have a SZ.

## 2019-04-03 DIAGNOSIS — K7031 Alcoholic cirrhosis of liver with ascites: Secondary | ICD-10-CM | POA: Diagnosis present

## 2019-04-03 DIAGNOSIS — F209 Schizophrenia, unspecified: Secondary | ICD-10-CM | POA: Diagnosis present

## 2019-04-03 DIAGNOSIS — D6959 Other secondary thrombocytopenia: Secondary | ICD-10-CM | POA: Diagnosis present

## 2019-04-03 DIAGNOSIS — K921 Melena: Secondary | ICD-10-CM | POA: Diagnosis not present

## 2019-04-03 DIAGNOSIS — I951 Orthostatic hypotension: Secondary | ICD-10-CM | POA: Diagnosis present

## 2019-04-03 DIAGNOSIS — Z811 Family history of alcohol abuse and dependence: Secondary | ICD-10-CM | POA: Diagnosis not present

## 2019-04-03 DIAGNOSIS — T148XXA Other injury of unspecified body region, initial encounter: Secondary | ICD-10-CM | POA: Diagnosis not present

## 2019-04-03 DIAGNOSIS — K7290 Hepatic failure, unspecified without coma: Secondary | ICD-10-CM

## 2019-04-03 DIAGNOSIS — F1024 Alcohol dependence with alcohol-induced mood disorder: Secondary | ICD-10-CM | POA: Diagnosis present

## 2019-04-03 DIAGNOSIS — R42 Dizziness and giddiness: Secondary | ICD-10-CM | POA: Diagnosis present

## 2019-04-03 DIAGNOSIS — E876 Hypokalemia: Secondary | ICD-10-CM | POA: Diagnosis present

## 2019-04-03 DIAGNOSIS — E871 Hypo-osmolality and hyponatremia: Secondary | ICD-10-CM | POA: Diagnosis present

## 2019-04-03 DIAGNOSIS — F419 Anxiety disorder, unspecified: Secondary | ICD-10-CM | POA: Diagnosis present

## 2019-04-03 DIAGNOSIS — K219 Gastro-esophageal reflux disease without esophagitis: Secondary | ICD-10-CM | POA: Diagnosis present

## 2019-04-03 DIAGNOSIS — D696 Thrombocytopenia, unspecified: Secondary | ICD-10-CM | POA: Diagnosis not present

## 2019-04-03 DIAGNOSIS — I8510 Secondary esophageal varices without bleeding: Secondary | ICD-10-CM | POA: Diagnosis present

## 2019-04-03 DIAGNOSIS — B1920 Unspecified viral hepatitis C without hepatic coma: Secondary | ICD-10-CM

## 2019-04-03 DIAGNOSIS — Z7289 Other problems related to lifestyle: Secondary | ICD-10-CM

## 2019-04-03 DIAGNOSIS — Z90710 Acquired absence of both cervix and uterus: Secondary | ICD-10-CM | POA: Diagnosis not present

## 2019-04-03 DIAGNOSIS — F10239 Alcohol dependence with withdrawal, unspecified: Secondary | ICD-10-CM | POA: Diagnosis present

## 2019-04-03 DIAGNOSIS — K5900 Constipation, unspecified: Secondary | ICD-10-CM | POA: Diagnosis present

## 2019-04-03 DIAGNOSIS — Z9114 Patient's other noncompliance with medication regimen: Secondary | ICD-10-CM | POA: Diagnosis not present

## 2019-04-03 DIAGNOSIS — Z79899 Other long term (current) drug therapy: Secondary | ICD-10-CM | POA: Diagnosis not present

## 2019-04-03 DIAGNOSIS — K2971 Gastritis, unspecified, with bleeding: Secondary | ICD-10-CM | POA: Diagnosis present

## 2019-04-03 DIAGNOSIS — F1014 Alcohol abuse with alcohol-induced mood disorder: Secondary | ICD-10-CM | POA: Diagnosis not present

## 2019-04-03 DIAGNOSIS — K068 Other specified disorders of gingiva and edentulous alveolar ridge: Secondary | ICD-10-CM | POA: Diagnosis present

## 2019-04-03 DIAGNOSIS — G40909 Epilepsy, unspecified, not intractable, without status epilepticus: Secondary | ICD-10-CM | POA: Diagnosis present

## 2019-04-03 DIAGNOSIS — Z20828 Contact with and (suspected) exposure to other viral communicable diseases: Secondary | ICD-10-CM | POA: Diagnosis present

## 2019-04-03 LAB — BPAM PLATELET PHERESIS
Blood Product Expiration Date: 202007252359
ISSUE DATE / TIME: 202007242352
Unit Type and Rh: 6200

## 2019-04-03 LAB — PREPARE PLATELET PHERESIS: Unit division: 0

## 2019-04-03 LAB — COMPREHENSIVE METABOLIC PANEL
ALT: 39 U/L (ref 0–44)
AST: 95 U/L — ABNORMAL HIGH (ref 15–41)
Albumin: 3.2 g/dL — ABNORMAL LOW (ref 3.5–5.0)
Alkaline Phosphatase: 163 U/L — ABNORMAL HIGH (ref 38–126)
Anion gap: 13 (ref 5–15)
BUN: 7 mg/dL (ref 6–20)
CO2: 24 mmol/L (ref 22–32)
Calcium: 8.7 mg/dL — ABNORMAL LOW (ref 8.9–10.3)
Chloride: 101 mmol/L (ref 98–111)
Creatinine, Ser: 0.52 mg/dL (ref 0.44–1.00)
GFR calc Af Amer: >60 mL/min (ref >60)
GFR calc non Af Amer: >60 mL/min (ref >60)
Glucose, Bld: 88 mg/dL (ref 70–99)
Potassium: 2.8 mmol/L — ABNORMAL LOW (ref 3.5–5.1)
Sodium: 138 mmol/L (ref 135–145)
Total Bilirubin: 3.9 mg/dL — ABNORMAL HIGH (ref 0.3–1.2)
Total Protein: 7.5 g/dL (ref 6.5–8.1)

## 2019-04-03 LAB — CBC
HCT: 36.5 % (ref 36.0–46.0)
Hemoglobin: 12.1 g/dL (ref 12.0–15.0)
MCH: 32.6 pg (ref 26.0–34.0)
MCHC: 33.2 g/dL (ref 30.0–36.0)
MCV: 98.4 fL (ref 80.0–100.0)
Platelets: 15 10*3/uL — CL (ref 150–400)
RBC: 3.71 MIL/uL — ABNORMAL LOW (ref 3.87–5.11)
RDW: 18.6 % — ABNORMAL HIGH (ref 11.5–15.5)
WBC: 2.5 10*3/uL — ABNORMAL LOW (ref 4.0–10.5)
nRBC: 0 % (ref 0.0–0.2)

## 2019-04-03 MED ORDER — HYPROMELLOSE (GONIOSCOPIC) 2.5 % OP SOLN: 1.0000 [drp] | Freq: Four times a day (QID) | OPHTHALMIC | Status: DC | PRN

## 2019-04-03 MED ORDER — POTASSIUM CHLORIDE 10 MEQ/100ML IV SOLN
10.0000 meq | INTRAVENOUS | Status: AC
  Administered 2019-04-03 (×2): 10 meq via INTRAVENOUS
  Filled 2019-04-03 (×2): qty 100

## 2019-04-03 MED ORDER — POLYVINYL ALCOHOL 1.4 % OP SOLN
1.0000 [drp] | OPHTHALMIC | Status: DC | PRN
  Administered 2019-04-03: 1 [drp] via OPHTHALMIC
  Filled 2019-04-03: qty 15

## 2019-04-03 MED ORDER — POTASSIUM CHLORIDE 10 MEQ/100ML IV SOLN
10.0000 meq | INTRAVENOUS | Status: AC
  Administered 2019-04-03 (×2): 10 meq via INTRAVENOUS
  Filled 2019-04-03 (×2): qty 100

## 2019-04-03 MED ORDER — ROMIPLOSTIM 250 MCG ~~LOC~~ SOLR
2.0000 ug/kg | Freq: Once | SUBCUTANEOUS | Status: AC
  Administered 2019-04-03: 100 ug via SUBCUTANEOUS
  Filled 2019-04-03: qty 0.2

## 2019-04-03 NOTE — Consult Note (Addendum)
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  Chief Complaint  Patient presents with  . Hematemesis  . Dizziness  . Abdominal Pain  : I was drinking a lot and fell.  HPI: [name] is a very nice 48 year old white female.  I last saw her back in May 2019.  At that time, she came in with hepatic failure and thrombocytopenia.  She has history of heavy alcohol use.  She still continues to drink.  We did get a bone marrow biopsy on her last year.  The bone marrow biopsy report did not show any evidence of malignancy.  There is no lymphoproliferative disease.  She had normal megakaryocytes.  She did not have any additional follow-up with [place] as an outpatient.  She went on a drinking binge again.  She apparently fell and hit her head.  She has a lot of bruising.  Last drink 2 days ago.  When she came to the emergency room, her white cell count was 3.9.  Hemoglobin 14.  Platelet count 11,000.  A month ago, her platelet count was 12,000.  She received a unit of platelets in the emergency room.  This morning, her platelet count is only 15,000.  Again, she has Hepatitis C.  I do think this has ever been treated.  She says she does not use any other recreational drugs.  She had a CT scan done yesterday.  She had cirrhosis with abdominal pelvic varices.  There was some thickening of the ascending and proximal transverse colon.  There is no splenomegaly.  She said that she is had some coffee-ground emesis.  I am sure this is probably from gastritis.  She is also had some hematochezia.  She clearly needs to have alcohol rehabilitation.  I suspect that her bone marrow is poisoned by alcohol and that it is going to be difficult for her her to improve her platelet count.  Overall, her performance status is ECOG 1.      Past Medical History:  Diagnosis Date  . Anxiety   .  Bipolar affective disorder ([place])    With anxiety features  . 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 Depression   . 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 ETOHism ([place])   . GERD (gastroesophageal reflux disease)   . Hematemesis 02/10/2018  . Hepatitis C 2018   hepatitis c and alcohol related hepatitis  . Heroin abuse ([place])   . History of blood transfusion    "blood doesn't clot; I fell down and had to have a transfusion"  . History of kidney stones   . Migraine    "when I get really stressed" (09/01/2017)  . Schizophrenia ([place])   . Seizures ([place])    "when I run out of my RX; lots recently" (09/01/2017)  :  Past Surgical History:  Procedure Laterality Date  . ESOPHAGOGASTRODUODENOSCOPY N/A 09/03/2017   Procedure: ESOPHAGOGASTRODUODENOSCOPY (EGD);  Surgeon: Doran Stabler, MD;  Location: [facility];  Service: Gastroenterology;  Laterality: N/A;  . FINGER FRACTURE SURGERY Left    "shattered my pinky"  . FRACTURE SURGERY    . I&D EXTREMITY Left 09/18/2018   Procedure: IRRIGATION AND DEBRIDEMENT EXTREMITY;  Surgeon: Leanora Cover, MD;  Location: [facility];  Service: Orthopedics;  Laterality: Left;  . IR PARACENTESIS  07/23/2017  . IR PARACENTESIS  07/2017   "did it twice in  the same week" (09/01/2017)  . SHOULDER OPEN ROTATOR CUFF REPAIR Right   . TUBAL LIGATION    . VAGINAL HYSTERECTOMY    :   Current Facility-Administered Medications:  .  0.9 %  sodium chloride infusion, 10 mL/hr, Intravenous, Once, Sherwood Gambler, MD .  0.9 %  sodium chloride infusion, 250 mL, Intravenous, PRN, Jani Gravel, MD .  chlordiazePOXIDE (LIBRIUM) capsule 25 mg, 25 mg, Oral, Once, Jani Gravel, MD .  diphenhydrAMINE (BENADRYL) injection 25 mg, 25 mg, Intravenous, Q6H PRN, Jani Gravel, MD, 25 mg at 04/02/19 2225 .  FLUoxetine (PROZAC) capsule 40 mg, 40 mg, Oral, Daily, Jani Gravel, MD .  folic acid (FOLVITE) tablet 1 mg, 1 mg, Oral, Daily, Jani Gravel, MD .  folic acid  (FOLVITE) tablet 1 mg, 1 mg, Oral, Daily, Jani Gravel, MD, 1 mg at 04/02/19 2224 .  gabapentin (NEURONTIN) capsule 600 mg, 600 mg, Oral, BID, Jani Gravel, MD, 600 mg at 04/02/19 2205 .  hydrOXYzine (ATARAX/VISTARIL) tablet 25 mg, 25 mg, Oral, Q6H PRN, Jani Gravel, MD .  lactulose (CHRONULAC) 10 GM/15ML solution 20 g, 20 g, Oral, TID, Jani Gravel, MD, 20 g at 04/02/19 2206 .  LORazepam (ATIVAN) injection 0-4 mg, 0-4 mg, Intravenous, Q6H **FOLLOWED BY** [START ON 04/04/2019] LORazepam (ATIVAN) injection 0-4 mg, 0-4 mg, Intravenous, Q12H, Jani Gravel, MD .  LORazepam (ATIVAN) tablet 1 mg, 1 mg, Oral, Q6H PRN **OR** LORazepam (ATIVAN) injection 1 mg, 1 mg, Intravenous, Q6H PRN, Jani Gravel, MD .  multivitamin with minerals tablet 1 tablet, 1 tablet, Oral, Daily, Jani Gravel, MD, 1 tablet at 04/02/19 2224 .  ondansetron (ZOFRAN) injection 4 mg, 4 mg, Intravenous, Q6H PRN, Jani Gravel, MD .  pantoprazole (PROTONIX) 80 mg in sodium chloride 0.9 % 250 mL (0.32 mg/mL) infusion, 8 mg/hr, Intravenous, Continuous, Jani Gravel, MD, Last Rate: 25 mL/hr at 04/03/19 0103, 8 mg/hr at 04/03/19 0103 .  phenytoin (DILANTIN) ER capsule 300 mg, 300 mg, Oral, Daily, Jani Gravel, MD .  potassium chloride 10 mEq in 100 mL IVPB, 10 mEq, Intravenous, Q1 Hr x 4, Jani Gravel, MD .  romiPLOStim (NPLATE) injection 100 mcg, 2 mcg/kg, Subcutaneous, Once, Zyad Boomer, Rudell Cobb, MD .  sodium chloride flush (NS) 0.9 % injection 3 mL, 3 mL, Intravenous, Q12H, Jani Gravel, MD, 3 mL at 04/02/19 2206 .  sodium chloride flush (NS) 0.9 % injection 3 mL, 3 mL, Intravenous, PRN, Jani Gravel, MD .  thiamine (VITAMIN B-1) tablet 100 mg, 100 mg, Oral, Daily **OR** thiamine (B-1) injection 100 mg, 100 mg, Intravenous, Daily, Jani Gravel, MD, 100 mg at 04/02/19 2224 .  topiramate (TOPAMAX) tablet 50 mg, 50 mg, Oral, Daily, Jani Gravel, MD, 50 mg at 04/02/19 2224 .  traZODone (DESYREL) tablet 100 mg, 100 mg, Oral, QHS, Jani Gravel, MD, 100 mg at 04/02/19 2205:  .  chlordiazePOXIDE  25 mg Oral Once  . FLUoxetine  40 mg Oral Daily  . folic acid  1 mg Oral Daily  . folic acid  1 mg Oral Daily  . gabapentin  600 mg Oral BID  . lactulose  20 g Oral TID  . LORazepam  0-4 mg Intravenous Q6H   Followed by  . [START ON 04/04/2019] LORazepam  0-4 mg Intravenous Q12H  . multivitamin with minerals  1 tablet Oral Daily  . phenytoin  300 mg Oral Daily  . romiPLOStim  2 mcg/kg Subcutaneous Once  . sodium chloride flush  3 mL Intravenous Q12H  . thiamine  100  mg Oral Daily   Or  . thiamine  100 mg Intravenous Daily  . topiramate  50 mg Oral Daily  . traZODone  100 mg Oral QHS  :  Allergies  Allergen Reactions  . Haldol [Haloperidol] Other (See Comments)    Made her feel bad  :  Family History  Problem Relation Age of Onset  . Lung cancer Mother 74  . Alcohol abuse Mother   . Throat cancer Father 45  :  Social History   Socioeconomic History  . Marital status: Divorced    Spouse name: Not on file  . Number of children: Not on file  . Years of education: Not on file  . Highest education level: Not on file  Occupational History  . Occupation: applying for disability  Social Needs  . Financial resource strain: Not on file  . Food insecurity    Worry: Not on file    Inability: Not on file  . Transportation needs    Medical: Not on file    Non-medical: Not on file  Tobacco Use  . Smoking status: Never Smoker  . Smokeless tobacco: Never Used  Substance and Sexual Activity  . Alcohol use: Yes    Alcohol/week: 63.0 standard drinks    Types: 63 Cans of beer per week    Comment: weekly "I have cut back"  . Drug use: Yes    Types: Marijuana, Cocaine  . Sexual activity: Not Currently  Lifestyle  . Physical activity    Days per week: Not on file    Minutes per session: Not on file  . Stress: Not on file  Relationships  . Social Herbalist on phone: Not on file    Gets together: Not on file    Attends religious service: Not on  file    Active member of club or organization: Not on file    Attends meetings of clubs or organizations: Not on file    Relationship status: Not on file  . Intimate partner violence    Fear of current or ex partner: Not on file    Emotionally abused: Not on file    Physically abused: Not on file    Forced sexual activity: Not on file  Other Topics Concern  . Not on file  Social History Narrative   She moved with a boyfriend to [place] and was followed at [facility].  He died of a massive heart attack in 8/18, per her report, and so she moved back to [place] and is living with a friend.  :  Review of Systems  Constitutional: Positive for malaise/fatigue.  HENT: Negative.   Eyes: Negative.   Respiratory: Negative.   Cardiovascular: Negative.   Gastrointestinal: Positive for blood in stool, nausea and vomiting.  Genitourinary: Negative.   Musculoskeletal: Positive for joint pain.  Skin: Negative.   Neurological: Positive for weakness.  Endo/Heme/Allergies: Bruises/bleeds easily.  Psychiatric/Behavioral: Positive for substance abuse.     Exam: Fairly well-developed and well-nourished white female in no obvious distress.  Vital signs show temperature of 98.9.  Pulse is 76.  Blood pressure 107/94.  Head neck exam shows no ocular or oral lesions.  She has some slight scleral icterus.  There is no adenopathy.  Lungs are clear bilaterally.  Cardiac exam regular rate and rhythm with no murmurs, rubs or bruits.  Abdomen is soft.  Bowel sounds are present.  She has some slight tenderness in the epigastric area.  There is  no obvious splenomegaly.  Liver edge might be at the right costal margin.  Extremities shows no clubbing, cyanosis or edema.  Skin exam shows ecchymoses throughout her body.  Neurological exam is nonfocal.  Patient Vitals for the past 24 hrs:  BP Temp Temp src Pulse Resp SpO2 Height Weight  04/03/19 0434 (!) 107/94 98.9 F (37.2 C) Oral 76 16 97 % - 111 lb 1.6  oz (50.4 kg)  04/03/19 0100 107/70 97.9 F (36.6 C) Oral 79 - 98 % - -  04/03/19 0015 113/71 98.9 F (37.2 C) Oral 74 14 99 % - -  04/03/19 0001 (!) 91/55 98.3 F (36.8 C) Oral 66 - 98 % - -  04/02/19 2100 - - - - - - 5' (1.524 m) 129 lb 6.6 oz (58.7 kg)  04/02/19 2039 112/80 98.6 F (37 C) Oral 69 18 99 % - -  04/02/19 2020 113/88 - - 87 - - - -  04/02/19 1945 113/88 - - 86 20 96 % - -  04/02/19 1930 113/88 - - - (!) 21 90 % - -  04/02/19 1845 - - - - 17 98 % - -  04/02/19 1830 116/86 - - - 17 100 % - -  04/02/19 1800 114/77 - - - 19 97 % - -  04/02/19 1752 112/77 - - 99 20 98 % - -  04/02/19 1739 - - - 100 16 100 % - -  04/02/19 1524 - - - 99 - - - -  04/02/19 1523 - - - (!) 108 - - - -  04/02/19 1521 - - - - - - 5' (1.524 m) 130 lb (59 kg)  04/02/19 1511 (!) 122/99 98 F (36.7 C) Oral (!) 115 18 96 % - -     Recent Labs    04/02/19 1608 04/03/19 0457  WBC 3.9* 2.5*  HGB 14.0 12.1  HCT 43.5 36.5  PLT 11* 15*   Recent Labs    04/02/19 1608 04/03/19 0457  NA 134* 138  K 3.3* 2.8*  CL 96* 101  CO2 19* 24  GLUCOSE 137* 88  BUN 7 7  CREATININE 0.72 0.52  CALCIUM 9.4 8.7*    Blood smear review: None  Pathology: None    Assessment and Plan: Ms. Schuelke is a 48 year old white female.  She has pronounced thrombocytopenia.  This certainly could be immune-based secondary to Hepatitis C.  It also could be secondary to alcohol poisoning from excessive alcohol use and cirrhosis.  She really did not have a response to platelet transfusion yesterday.  As such, this might be thought of as immune based.  I will try her on a Nplate.  I really do not want to try her on any Decadron because of the likely gastritis that she already has and I do not want to potentiate any bleeding.  I may consider IVIG for her.   Ultimately, she has to stop drinking.  If not, she will clearly develop hepatic failure.  Her bilirubin is already on the high side.  It is hard to tell if she  really appreciates the problem that she has.  We will follow along.  Hopefully, she will get the help that she needs to stop drinking.  We will check her Hepatitis C levels to see if she has active hepatitis.  If so, this will have to be treated.  I know that there are some oral drugs that we have for thrombocytopenia secondary to liver failure.  However, I doubt that Ms. Spare would be a good candidate for these as I am not sure she would be compliant.  I do feel bad for her.  She seems very nice.  She just has a tough problem.  Lattie Haw, MD  Oswaldo Milian 54:5

## 2019-04-03 NOTE — Progress Notes (Addendum)
PROGRESS NOTE    Briana Sweeney  CVE:[id number] DOB: 06/03/1971 DOA: 04/02/2019 PCP: [name], FNP   Brief Narrative: Per HPI:  48 y.o. female,   polysubstance abuse,chronic alcoholism, hepatitis C, liver cirrhosis, chronic pancytopenia, bipolar disorder with anxiety features brought to the ED on 5/13 from home after a fall.Patient has continued to drink alcohol despite her advanced liver disease. She was apparently at a friend's house after drinking excessively when she fell and hit her head. Patient lost some consciousness briefly. Pt c/o easy bruising, dizziness, and fall 4 days ago where she hit her head on bathtub.  Pt also notes coffee ground emesis yesterday x4.  Pt states that she was trying to drink and just kept throwing up.  Pt notes slight brbpr 1 week ago.  "not too much". " just happens from time to time"  Denies fever, chills, cough, cp, palp, sob, abd pain, diarrhea, brbpr today.   In ED, VSS, CT brain- no acute abnormality. 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 labs with platelets of 11k, abnormal lfts Ast 148, Alt 49, Alk phos 202, T. Bili 4.8, INR 1.3, Urinalysis negative,Covid-19 negative Hem/onc consulted and admitted   Subjective: Feels nervous, wants to eat. No fever, chest pain, nausea. Abdomen pain comes and goes, midl abd distension- reports 2 years back abdomen was big as if she was pregnant and had tap done x2  Assessment & Plan:   Profound Thrombocytopenia-s/p plt transfusion- up at 15k. Hem on board, for endplate.  Recent Labs  Lab 04/02/19 1608 04/03/19 0457  PLT 11* 15*   Hypokalemia;repleting iv. Recent Labs  Lab  04/02/19 1608 04/03/19 0457  K 3.3* 2.8*   Hyponatremia-improved  Fall/dizzxiness-w etoh use/withdrawl- check OV. Ct head neg  Hepatitis C- checking viral load  Alcoholic cirrhosis of liver with ascites w e/o esophageal varices in CT: had paracentesis 2 yrs ago per her and had significant abd distension. abdomen not distended, non tender. Coffee ground emesis- recent black stool 4 dasy ago- no BRBPR: Hb stable tho some drop. on ppi. likley has gastritis and also has varices in imaging. Doesn't report seeing GI. Will consult/discuss with Dr. Paulita Fujita from GI-I discussed with Dt outlaw from GI, who knows her from previous admission and has had multiple admissions for similar presentation and her last endoscopy in 2018 did not show any active bleeding but grade 2 esophageal varices.  At this time we will continue monitor and call GI on PRN basis, no indication for procedure unless patient has active bleeding.  Alcohol abuse, last etoh 3 days ago per her. Got librium. Cont CIWA, vitamins. At risk for withdrawal.normally gets shakes if not drinking.  GERD- cont pi  BPD/MDD : Nervous,non gabapentin,prozac,trazodone  Seizure disorder on dilantin, level low- non compliance  DVT prophylaxis: SCD Code Status: full code  Family Communication: plan of care discussed with patient in detail.  Disposition Plan: Remains inpatient pending clinical improvement.   Consultants:  Hem/Onc  Procedures:  Microbiology: covid 19 neg  EGD 08/2017  Grade II esophageal varices. - Portal hypertensive gastropathy. - Normal examined duodenum. - No specimens collected.  Antimicrobials: Anti-infectives (From admission, onward)   None       Objective: Vitals:   04/03/19 0001 04/03/19  0015 04/03/19 0100 04/03/19 0434  BP: (!) 91/55 113/71 107/70 (!) 107/94  Pulse: 66 74 79 76  Resp:  14  16  Temp: 98.3 F (36.8 C) 98.9 F (37.2 C) 97.9 F (36.6 C) 98.9 F (37.2 C)  TempSrc: Oral Oral Oral  Oral  SpO2: 98% 99% 98% 97%  Weight:    50.4 kg  Height:        Intake/Output Summary (Last 24 hours) at 04/03/2019 0928 Last data filed at 04/03/2019 0300 Gross per 24 hour  Intake 293.33 ml  Output 200 ml  Net 93.33 ml   Filed Weights   04/02/19 1521 04/02/19 2100 04/03/19 0434  Weight: 59 kg 58.7 kg 50.4 kg   Weight change:   Body mass index is 21.7 kg/m.  Intake/Output from previous day: 07/24 0701 - 07/25 0700 In: 293.3 [P.O.:50; I.V.:50; Blood:193.3] Out: 200 [Urine:200] Intake/Output this shift: No intake/output data recorded.  Examination:  General exam: Appears calm and comfortable, nervous,  HEENT:PERRL,Oral mucosa moist, Ear/Nose normal on gross exam Respiratory system: Bilateral equal air entry, normal vesicular breath sounds, no wheezes or crackles  Cardiovascular system: S1 & S2 heard,No JVD, murmurs. Gastrointestinal system: Abdomen is  soft, mildly distended, minimally tender, BS +  Nervous System:Alert and oriented. No focal neurological deficits/moving extremities, sensation intact. Extremities: No edema, no clubbing, distal peripheral pulses palpable. Skin: No rashes, lesions, no icterus MSK: Normal muscle bulk,tone ,power  Medications:  Scheduled Meds:  chlordiazePOXIDE  25 mg Oral Once   FLUoxetine  40 mg Oral Daily   folic acid  1 mg Oral Daily   folic acid  1 mg Oral Daily   gabapentin  600 mg Oral BID   lactulose  20 g Oral TID   LORazepam  0-4 mg Intravenous Q6H   Followed by   Derrill Memo ON 04/04/2019] LORazepam  0-4 mg Intravenous Q12H   multivitamin with minerals  1 tablet Oral Daily   phenytoin  300 mg Oral Daily   romiPLOStim  2 mcg/kg Subcutaneous Once   sodium chloride flush  3 mL Intravenous Q12H   thiamine  100 mg Oral Daily   Or   thiamine  100 mg Intravenous Daily   topiramate  50 mg Oral Daily   traZODone  100 mg Oral QHS   Continuous Infusions:  sodium chloride     sodium chloride     pantoprozole  (PROTONIX) infusion 8 mg/hr (04/03/19 0103)   potassium chloride 10 mEq (04/03/19 0845)    Data Reviewed: I have personally reviewed following labs and imaging studies  CBC: Recent Labs  Lab 04/02/19 1608 04/03/19 0457  WBC 3.9* 2.5*  NEUTROABS 1.6*  --   HGB 14.0 12.1  HCT 43.5 36.5  MCV 99.5 98.4  PLT 11* 15*   Basic Metabolic Panel: Recent Labs  Lab 04/02/19 1608 04/03/19 0457  NA 134* 138  K 3.3* 2.8*  CL 96* 101  CO2 19* 24  GLUCOSE 137* 88  BUN 7 7  CREATININE 0.72 0.52  CALCIUM 9.4 8.7*   GFR: Estimated Creatinine Clearance: 63.1 mL/min (by C-G formula based on SCr of 0.52 mg/dL). Liver Function Tests: Recent Labs  Lab 04/02/19 1608 04/03/19 0457  AST 148* 95*  ALT 49* 39  ALKPHOS 202* 163*  BILITOT 4.8* 3.9*  PROT 8.9* 7.5  ALBUMIN 3.7 3.2*   Recent Labs  Lab 04/02/19 1608  LIPASE 62*   No results for input(s): AMMONIA in the last 168 hours. Coagulation Profile: Recent Labs  Lab 04/02/19 1608  INR 1.3*   Cardiac Enzymes: No results for input(s): CKTOTAL, CKMB, CKMBINDEX, TROPONINI in the last 168 hours. BNP (last 3 results) No results for input(s): PROBNP in the last 8760 hours. HbA1C: No results for input(s): HGBA1C in the last 72 hours. CBG: No results for input(s): GLUCAP in the last 168 hours. Lipid Profile: No results for input(s): CHOL, HDL, LDLCALC, TRIG, CHOLHDL, LDLDIRECT in the last 72 hours. Thyroid Function Tests: No results for input(s): TSH, T4TOTAL, FREET4, T3FREE, THYROIDAB in the last 72 hours. Anemia Panel: No results for input(s): VITAMINB12, FOLATE, FERRITIN, TIBC, IRON, RETICCTPCT in the last 72 hours. Sepsis Labs: No results for input(s): PROCALCITON, LATICACIDVEN in the last 168 hours.  Recent Results (from the past 240 hour(s))  SARS Coronavirus 2 (CEPHEID - Performed in [place] hospital lab), Hosp Order     Status: None   Collection Time: 04/02/19  5:52 PM   Specimen: Urine, Clean Catch; Nasopharyngeal   Result Value Ref Range Status   SARS Coronavirus 2 NEGATIVE NEGATIVE Final    Comment: (NOTE) If result is NEGATIVE SARS-CoV-2 target nucleic acids are NOT DETECTED. The SARS-CoV-2 RNA is generally detectable in upper and lower  respiratory specimens during the acute phase of infection. The lowest  concentration of SARS-CoV-2 viral copies this assay can detect is 250  copies / mL. A negative result does not preclude SARS-CoV-2 infection  and should not be used as the sole basis for treatment or other  patient management decisions.  A negative result may occur with  improper specimen collection / handling, submission of specimen other  than nasopharyngeal swab, presence of viral mutation(s) within the  areas targeted by this assay, and inadequate number of viral copies  (<250 copies / mL). A negative result must be combined with clinical  observations, patient history, and epidemiological information. If result is POSITIVE SARS-CoV-2 target nucleic acids are DETECTED. The SARS-CoV-2 RNA is generally detectable in upper and lower  respiratory specimens dur ing the acute phase of infection.  Positive  results are indicative of active infection with SARS-CoV-2.  Clinical  correlation with patient history and other diagnostic information is  necessary to determine patient infection status.  Positive results do  not rule out bacterial infection or co-infection with other viruses. If result is PRESUMPTIVE POSTIVE SARS-CoV-2 nucleic acids MAY BE PRESENT.   A presumptive positive result was obtained on the submitted specimen  and confirmed on repeat testing.  While 2019 novel coronavirus  (SARS-CoV-2) nucleic acids may be present in the submitted sample  additional confirmatory testing may be necessary for epidemiological  and / or clinical management purposes  to differentiate between  SARS-CoV-2 and other Sarbecovirus currently known to infect humans.  If clinically indicated additional  testing with an alternate test  methodology [phone]) is advised. The SARS-CoV-2 RNA is generally  detectable in upper and lower respiratory sp ecimens during the acute  phase of infection. The expected result is Negative. Fact Sheet for Patients:  StrictlyIdeas.no Fact Sheet for Healthcare Providers: BankingDealers.co.za This test is not yet approved or cleared by the [place] FDA and has been authorized for detection and/or diagnosis of SARS-CoV-2 by FDA under an Emergency Use Authorization (EUA).  This EUA will remain in effect (meaning this test can be used) for the duration of the COVID-19 declaration under Section 564(b)(1) of the Act, 21 U.S.C. section 360bbb-3(b)(1), unless the authorization is terminated or revoked sooner. Performed at [facility], [place]  [address] [place], [place] [address]       Radiology Studies: Ct Head Wo Contrast  Result Date: 04/02/2019 CLINICAL DATA:  Pt report that she has low platelets of 12, and states it is d/t her ETOH use pt reports that she was told on her last visit here, Pt reports that she has been having increased dizziness, HA, poor appetite, bruising easily. EXAM: CT HEAD WITHOUT CONTRAST TECHNIQUE: Contiguous axial images were obtained from the base of the skull through the vertex without intravenous contrast. COMPARISON:  02/03/2019 FINDINGS: Brain: There is no intra or extra-axial fluid collection or mass lesion. The basilar cisterns and ventricles have a normal appearance. There is no CT evidence for acute infarction or hemorrhage. Stable small calcification within the LEFT cerebellum. Vascular: No hyperdense vessel or unexpected calcification. Skull: Normal. Negative for fracture or focal lesion. Sinuses/Orbits: No acute finding. Other: None IMPRESSION: No evidence for acute abnormality. Electronically Signed   By: Nolon Nations M.D.   On: 04/02/2019 17:51   Ct  Abdomen Pelvis W Contrast  Result Date: 04/02/2019 CLINICAL DATA:  Nausea, vomiting, multiple falls EXAM: CT ABDOMEN AND PELVIS WITH CONTRAST TECHNIQUE: Multidetector CT imaging of the abdomen and pelvis was performed using the standard protocol following bolus administration of intravenous contrast. CONTRAST:  159m OMNIPAQUE IOHEXOL 300 MG/ML  SOLN COMPARISON:  02/18/2019 FINDINGS: Lower chest: Heterogeneous and ground-glass opacity of the left lung base (series 4, image 24), increased compared to prior examination dated 02/18/2019. Numerous esophageal varices (series 2, image 12). Hepatobiliary: Severe hepatic steatosis, with a somewhat coarse contour of the liver. Gallstones. No gallbladder wall thickening, or biliary dilatation. Pancreas: Coarse calcifications of the pancreatic uncinate. No pancreatic ductal dilatation or surrounding inflammatory changes. Spleen: Normal in size without significant abnormality. Adrenals/Urinary Tract: Adrenal glands are unremarkable. Kidneys are normal, without renal calculi, solid lesion, or hydronephrosis. Bladder is unremarkable. Stomach/Bowel: Stomach is within normal limits. Appendix appears normal. 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 Vascular/Lymphatic: No significant vascular findings are present. No enlarged abdominal or pelvic lymph nodes. Reproductive: Numerous bilateral ovarian and uterine varices. The left ovarian vein is enlarged, measuring 1.4 cm at the confluence with the left renal vein. Other: No abdominal wall hernia or abnormality. No abdominopelvic ascites. Musculoskeletal: No acute or significant osseous findings. IMPRESSION: 1.  No acute CT abnormality of the abdomen or pelvis. 2. Heterogeneous and ground-glass opacity of the left lung base (series 4, image 24), increased compared to prior examination dated 02/18/2019, concerning for infection or aspiration. 3.   Stigmata of cirrhosis with abdominal and pelvic varices. 4. 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 Electronically Signed   By: AEddie CandleM.D.   On: 04/02/2019 17:46      LOS: 0 days   Time spent: More than 50% of that time was spent in counseling and/or coordination of care.  RAntonieta Pert MD [facility]  04/03/2019, 9:28 AM

## 2019-04-03 NOTE — Progress Notes (Signed)
CRITICAL VALUE ALERT  Critical Value:  K+ 2.8; Platelets 15  Date & Time Notied:  04-03-2019  Provider Notified: Maudie Mercury, MD  Orders Received/Actions taken: IV K+ x 4

## 2019-04-04 LAB — CBC WITH DIFFERENTIAL/PLATELET
Abs Immature Granulocytes: 0 10*3/uL (ref 0.00–0.07)
Basophils Absolute: 0 10*3/uL (ref 0.0–0.1)
Basophils Relative: 1 %
Eosinophils Absolute: 0.1 10*3/uL (ref 0.0–0.5)
Eosinophils Relative: 3 %
HCT: 37.3 % (ref 36.0–46.0)
Hemoglobin: 12.1 g/dL (ref 12.0–15.0)
Immature Granulocytes: 0 %
Lymphocytes Relative: 37 %
Lymphs Abs: 0.7 10*3/uL (ref 0.7–4.0)
MCH: 32.5 pg (ref 26.0–34.0)
MCHC: 32.4 g/dL (ref 30.0–36.0)
MCV: 100.3 fL — ABNORMAL HIGH (ref 80.0–100.0)
Monocytes Absolute: 0.4 10*3/uL (ref 0.1–1.0)
Monocytes Relative: 23 %
Neutro Abs: 0.6 10*3/uL — ABNORMAL LOW (ref 1.7–7.7)
Neutrophils Relative %: 36 %
Platelets: 12 10*3/uL — CL (ref 150–400)
RBC: 3.72 MIL/uL — ABNORMAL LOW (ref 3.87–5.11)
RDW: 18.8 % — ABNORMAL HIGH (ref 11.5–15.5)
WBC: 1.8 10*3/uL — ABNORMAL LOW (ref 4.0–10.5)
nRBC: 0 % (ref 0.0–0.2)

## 2019-04-04 LAB — BASIC METABOLIC PANEL
Anion gap: 11 (ref 5–15)
BUN: 6 mg/dL (ref 6–20)
CO2: 20 mmol/L — ABNORMAL LOW (ref 22–32)
Calcium: 8.9 mg/dL (ref 8.9–10.3)
Chloride: 106 mmol/L (ref 98–111)
Creatinine, Ser: 0.5 mg/dL (ref 0.44–1.00)
GFR calc Af Amer: >60 mL/min (ref >60)
GFR calc non Af Amer: >60 mL/min (ref >60)
Glucose, Bld: 92 mg/dL (ref 70–99)
Potassium: 4 mmol/L (ref 3.5–5.1)
Sodium: 137 mmol/L (ref 135–145)

## 2019-04-04 MED ORDER — PANTOPRAZOLE SODIUM 40 MG PO TBEC
40.0000 mg | DELAYED_RELEASE_TABLET | Freq: Two times a day (BID) | ORAL | Status: DC
  Administered 2019-04-05 – 2019-04-13 (×18): 40 mg via ORAL
  Filled 2019-04-04 (×18): qty 1

## 2019-04-04 MED ORDER — SODIUM CHLORIDE 0.9 % IV SOLN
INTRAVENOUS | Status: DC
  Administered 2019-04-04 – 2019-04-09 (×10): via INTRAVENOUS

## 2019-04-04 MED ORDER — CHLORDIAZEPOXIDE HCL 25 MG PO CAPS
25.0000 mg | ORAL_CAPSULE | ORAL | Status: AC
  Administered 2019-04-05 – 2019-04-06 (×2): 25 mg via ORAL
  Filled 2019-04-04 (×2): qty 1

## 2019-04-04 MED ORDER — CHLORDIAZEPOXIDE HCL 25 MG PO CAPS
25.0000 mg | ORAL_CAPSULE | Freq: Four times a day (QID) | ORAL | Status: AC
  Administered 2019-04-04: 25 mg via ORAL
  Filled 2019-04-04: qty 1

## 2019-04-04 MED ORDER — CHLORDIAZEPOXIDE HCL 25 MG PO CAPS
25.0000 mg | ORAL_CAPSULE | Freq: Three times a day (TID) | ORAL | Status: DC
  Filled 2019-04-04: qty 1

## 2019-04-04 MED ORDER — CHLORDIAZEPOXIDE HCL 25 MG PO CAPS
25.0000 mg | ORAL_CAPSULE | Freq: Three times a day (TID) | ORAL | Status: AC
  Administered 2019-04-05 (×3): 25 mg via ORAL
  Filled 2019-04-04 (×3): qty 1

## 2019-04-04 MED ORDER — ADULT MULTIVITAMIN W/MINERALS CH: 1.0000 | ORAL_TABLET | Freq: Every day | ORAL | Status: DC

## 2019-04-04 MED ORDER — CHLORDIAZEPOXIDE HCL 25 MG PO CAPS
25.0000 mg | ORAL_CAPSULE | Freq: Every day | ORAL | Status: AC
  Administered 2019-04-07: 25 mg via ORAL
  Filled 2019-04-04: qty 1

## 2019-04-04 NOTE — Plan of Care (Signed)
  Problem: Education: Goal: Knowledge of General Education information will improve Description: Including pain rating scale, medication(s)/side effects and non-pharmacologic comfort measures Outcome: Progressing   Problem: Health Behavior/Discharge Planning: Goal: Ability to manage health-related needs will improve Outcome: Progressing   Problem: Clinical Measurements: Goal: Ability to maintain clinical measurements within normal limits will improve Outcome: Progressing Goal: Will remain free from infection Outcome: Progressing Goal: Diagnostic test results will improve Outcome: Progressing Goal: Respiratory complications will improve Outcome: Progressing Goal: Cardiovascular complication will be avoided Outcome: Progressing   Problem: Coping: Goal: Level of anxiety will decrease Outcome: Progressing   Problem: Pain Managment: Goal: General experience of comfort will improve Outcome: Progressing   Problem: Skin Integrity: Goal: Risk for impaired skin integrity will decrease Outcome: Progressing   Problem: Activity: Goal: Risk for activity intolerance will decrease Outcome: Not Progressing   Problem: Safety: Goal: Ability to remain free from injury will improve Outcome: Not Progressing   Problem: Nutrition: Goal: Adequate nutrition will be maintained Outcome: Adequate for Discharge   Problem: Elimination: Goal: Will not experience complications related to bowel motility Outcome: Adequate for Discharge Goal: Will not experience complications related to urinary retention Outcome: Adequate for Discharge

## 2019-04-04 NOTE — Progress Notes (Signed)
PROGRESS NOTE    Briana Sweeney  BJS:[id number] DOB: [birth year] DOA: 04/02/2019 PCP: [name], FNP   Brief Narrative: Per HPI:  48 y.o. female,   polysubstance abuse,chronic alcoholism, hepatitis C, liver cirrhosis, chronic pancytopenia, bipolar disorder with anxiety features brought to the ED on 5/13 from home after a fall.Patient has continued to drink alcohol despite her advanced liver disease. She was apparently at a friend's house after drinking excessively when she fell and hit her head. Patient lost some consciousness briefly. Pt c/o easy bruising, dizziness, and fall 4 days ago where she hit her head on bathtub.  Pt also notes coffee ground emesis yesterday x4.  Pt states that she was trying to drink and just kept throwing up.  Pt notes slight brbpr 1 week ago.  "not too much". " just happens from time to time"  Denies fever, chills, cough, cp, palp, sob, abd pain, diarrhea, brbpr today.   In ED, VSS, CT brain- no acute abnormality. 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 labs with platelets of 11k, abnormal lfts Ast 148, Alt 49, Alk phos 202, T. Bili 4.8, INR 1.3, Urinalysis negative,Covid-19 negative Hem/onc consulted and admitted   Subjective: Mildly tremulous, unsteady and dizzy with ambulation, no nausea vomiting fever or chills.   No acute events overnight.  Assessment & Plan:   Profound Thrombocytopenia-s/p plt transfusion- up at 15k-> downtrending. Hem/onc on  board, s/p Nplate 7/25. Plan per hem onc  Recent Labs  Lab 04/02/19 1608 04/03/19 0457 04/04/19 0728  PLT 11* 15* 12*   Hypokalemia;repleted and resolved. Recent Labs  Lab  04/02/19 1608 04/03/19 0457 04/04/19 0442  K 3.3* 2.8* 4.0   Hyponatremia-improved  Fall/dizzxiness-likely due to Etoh use/withdrawl.orthostatic hypotension on standing 113/93 and on sitting 134/93  Positive. CT head neg. we will hydrate her with IV fluids.  Hepatitis C- checking viral load  Alcoholic cirrhosis of liver with ascites w e/o esophageal varices in CT: had paracentesis 2 yrs ago per her and had significant abd distension. abdomen not distended, non tender.  Coffee ground emesis- recent black stool 4 days ago- no BRBPR: Hb stable tho some drop. on ppi-switched to p.o.likley has gastritis and also has varices in imaging. I discussed with Dr Paulita Fujita from GI, who knows her from previous admission and has had multiple admissions for similar presentation and her last endoscopy in 2018 did not show any active bleeding but grade 2 esophageal varices.  At this time we will continue monitor and call GI on PRN basis, no indication for procedure unless patient has active bleeding.  Alcohol abuse, last etoh 3 days ago per her. Got librium. Cont CIWA, vitamins. At risk for withdrawal.normally gets shakes if not drinking.  Continue on Librium taper- AS CIWA scale is high  GERD- cont pi  BPD/MDD : Nervous,non gabapentin,prozac,trazodone  Seizure disorder on dilantin, level low- non compliance  DVT prophylaxis: SCD Code Status: full code  Family Communication: plan of care discussed with patient in detail.  Disposition Plan: Remains inpatient pending clinical improvement.  Increase PT OT.   Consultants:  Hem/Onc  Procedures:  Microbiology: covid 19 neg  EGD 08/2017  Grade II esophageal varices. - Portal hypertensive gastropathy. - Normal examined duodenum. - No specimens collected.  Antimicrobials: Anti-infectives (From admission, onward)   None       Objective: Vitals:   04/03/19 0434 04/03/19 1150 04/04/19 0000 04/04/19 0635  BP: (!) 107/94 112/84 103/60 (!) 110/93    Pulse: 76 90 82 93  Resp: _0 Temp: 98.9 F (37.2 C) 98.4 F (36.9 C) 98.4 F (36.9 C) 97.8 F (36.6 C)  TempSrc: Oral Oral Oral Oral  SpO2: 97% 96% 96% 95%  Weight: 50.4 kg     Height:        Intake/Output Summary (Last 24 hours) at 04/04/2019 1118 Last data filed at 04/04/2019 0900 Gross per 24 hour  Intake 2134.13 ml  Output 600 ml  Net 1534.13 ml   Filed Weights   04/02/19 1521 04/02/19 2100 04/03/19 0434  Weight: 59 kg 58.7 kg 50.4 kg   Weight change:   Body mass index is 21.7 kg/m.  Intake/Output from previous day: 07/25 0701 - 07/26 0700 In: 1894.1 [P.O.:720; I.V.:874.1; IV Piggyback:300] Out: 600 [Urine:600] Intake/Output this shift: Total I/O In: 240 [P.O.:240] Out: -   Examination:  General exam: Alert awake nervous, tremulous.  HEENT:PERRL,Oral mucosa moist, Ear/Nose normal on gross exam Respiratory system: Bilateral equal air entry, normal vesicular breath sounds, no wheezes or crackles  Cardiovascular system: S1 & S2 heard,No JVD, murmurs. Gastrointestinal system: Abdomen is  soft, mildly distended, minimally tender, BS +  Nervous System:Alert and oriented. No focal neurological deficits/moving extremities, sensation intact. Extremities: No edema, no clubbing, distal peripheral pulses palpable. Skin: No rashes, lesions, no icterus MSK: Normal muscle bulk,tone ,power  Medications:  Scheduled Meds:  FLUoxetine  40 mg Oral Daily   folic acid  1 mg Oral Daily   folic acid  1 mg Oral Daily   gabapentin  600 mg Oral BID   lactulose  20 g Oral TID   LORazepam  0-4 mg Intravenous Q6H   Followed by   LORazepam  0-4 mg Intravenous Q12H   multivitamin with minerals  1 tablet Oral Daily   phenytoin  300 mg Oral Daily   sodium chloride flush  3 mL Intravenous Q12H   thiamine  100 mg Oral Daily   Or   thiamine  100 mg Intravenous Daily   topiramate  50 mg Oral Daily   traZODone  100 mg Oral QHS   Continuous Infusions:   sodium chloride     sodium chloride     pantoprozole (PROTONIX) infusion 8 mg/hr (04/04/19 1004)    Data Reviewed: I have personally reviewed following labs and imaging studies  CBC: Recent Labs  Lab 04/02/19 1608 04/03/19 0457 04/04/19 0728  WBC 3.9* 2.5* 1.8*  NEUTROABS 1.6*  --  0.6*  HGB 14.0 12.1 12.1  HCT 43.5 36.5 37.3  MCV 99.5 98.4 100.3*  PLT 11* 15* 12*   Basic Metabolic Panel: Recent Labs  Lab 04/02/19 1608 04/03/19 0457 04/04/19 0442  NA 134* 138 137  K 3.3* 2.8* 4.0  CL 96* 101 106  CO2 19* 24 20*  GLUCOSE 137* 88 92  BUN _1 CREATININE 0.72 0.52 0.50  CALCIUM 9.4 8.7* 8.9   GFR: Estimated Creatinine Clearance: 63.1 mL/min (by C-G formula based on SCr of 0.5 mg/dL). Liver Function Tests: Recent Labs  Lab 04/02/19 1608 04/03/19 0457  AST 148* 95*  ALT 49* 39  ALKPHOS 202* 163*  BILITOT 4.8* 3.9*  PROT 8.9* 7.5  ALBUMIN 3.7 3.2*   Recent Labs  Lab 04/02/19 1608  LIPASE  62*   No results for input(s): AMMONIA in the last 168 hours. Coagulation Profile: Recent Labs  Lab 04/02/19 1608  INR 1.3*   Cardiac Enzymes: No results for input(s): CKTOTAL, CKMB, CKMBINDEX, TROPONINI in the last 168 hours. BNP (last 3 results) No results for input(s): PROBNP in the last 8760 hours. HbA1C: No results for input(s): HGBA1C in the last 72 hours. CBG: No results for input(s): GLUCAP in the last 168 hours. Lipid Profile: No results for input(s): CHOL, HDL, LDLCALC, TRIG, CHOLHDL, LDLDIRECT in the last 72 hours. Thyroid Function Tests: No results for input(s): TSH, T4TOTAL, FREET4, T3FREE, THYROIDAB in the last 72 hours. Anemia Panel: No results for input(s): VITAMINB12, FOLATE, FERRITIN, TIBC, IRON, RETICCTPCT in the last 72 hours. Sepsis Labs: No results for input(s): PROCALCITON, LATICACIDVEN in the last 168 hours.  Recent Results (from the past 240 hour(s))  SARS Coronavirus 2 (CEPHEID - Performed in [place] hospital lab), Hosp Order      Status: None   Collection Time: 04/02/19  5:52 PM   Specimen: Urine, Clean Catch; Nasopharyngeal  Result Value Ref Range Status   SARS Coronavirus 2 NEGATIVE NEGATIVE Final    Comment: (NOTE) If result is NEGATIVE SARS-CoV-2 target nucleic acids are NOT DETECTED. The SARS-CoV-2 RNA is generally detectable in upper and lower  respiratory specimens during the acute phase of infection. The lowest  concentration of SARS-CoV-2 viral copies this assay can detect is 250  copies / mL. A negative result does not preclude SARS-CoV-2 infection  and should not be used as the sole basis for treatment or other  patient management decisions.  A negative result may occur with  improper specimen collection / handling, submission of specimen other  than nasopharyngeal swab, presence of viral mutation(s) within the  areas targeted by this assay, and inadequate number of viral copies  (<250 copies / mL). A negative result must be combined with clinical  observations, patient history, and epidemiological information. If result is POSITIVE SARS-CoV-2 target nucleic acids are DETECTED. The SARS-CoV-2 RNA is generally detectable in upper and lower  respiratory specimens dur ing the acute phase of infection.  Positive  results are indicative of active infection with SARS-CoV-2.  Clinical  correlation with patient history and other diagnostic information is  necessary to determine patient infection status.  Positive results do  not rule out bacterial infection or co-infection with other viruses. If result is PRESUMPTIVE POSTIVE SARS-CoV-2 nucleic acids MAY BE PRESENT.   A presumptive positive result was obtained on the submitted specimen  and confirmed on repeat testing.  While 2019 novel coronavirus  (SARS-CoV-2) nucleic acids may be present in the submitted sample  additional confirmatory testing may be necessary for epidemiological  and / or clinical management purposes  to differentiate between   SARS-CoV-2 and other Sarbecovirus currently known to infect humans.  If clinically indicated additional testing with an alternate test  methodology [phone]) is advised. The SARS-CoV-2 RNA is generally  detectable in upper and lower respiratory sp ecimens during the acute  phase of infection. The expected result is Negative. Fact Sheet for Patients:  StrictlyIdeas.no Fact Sheet for Healthcare Providers: BankingDealers.co.za This test is not yet approved or cleared by the [place] FDA and has been authorized for detection and/or diagnosis of SARS-CoV-2 by FDA under an Emergency Use Authorization (EUA).  This EUA will remain in effect (meaning this test can be used) for the duration of the COVID-19 declaration under Section 564(b)(1) of the Act, 21 U.S.C.  section 360bbb-3(b)(1), unless the authorization is terminated or revoked sooner. Performed at [facility], [place] [address] [place], [place] [address]       Radiology Studies: Ct Head Wo Contrast  Result Date: 04/02/2019 CLINICAL DATA:  Pt report that she has low platelets of 12, and states it is d/t her ETOH use pt reports that she was told on her last visit here, Pt reports that she has been having increased dizziness, HA, poor appetite, bruising easily. EXAM: CT HEAD WITHOUT CONTRAST TECHNIQUE: Contiguous axial images were obtained from the base of the skull through the vertex without intravenous contrast. COMPARISON:  02/03/2019 FINDINGS: Brain: There is no intra or extra-axial fluid collection or mass lesion. The basilar cisterns and ventricles have a normal appearance. There is no CT evidence for acute infarction or hemorrhage. Stable small calcification within the LEFT cerebellum. Vascular: No hyperdense vessel or unexpected calcification. Skull: Normal. Negative for fracture or focal lesion. Sinuses/Orbits: No acute finding. Other: None IMPRESSION: No evidence  for acute abnormality. Electronically Signed   By: Nolon Nations M.D.   On: 04/02/2019 17:51   Ct Abdomen Pelvis W Contrast  Result Date: 04/02/2019 CLINICAL DATA:  Nausea, vomiting, multiple falls EXAM: CT ABDOMEN AND PELVIS WITH CONTRAST TECHNIQUE: Multidetector CT imaging of the abdomen and pelvis was performed using the standard protocol following bolus administration of intravenous contrast. CONTRAST:  155m OMNIPAQUE IOHEXOL 300 MG/ML  SOLN COMPARISON:  02/18/2019 FINDINGS: Lower chest: Heterogeneous and ground-glass opacity of the left lung base (series 4, image 24), increased compared to prior examination dated 02/18/2019. Numerous esophageal varices (series 2, image 12). Hepatobiliary: Severe hepatic steatosis, with a somewhat coarse contour of the liver. Gallstones. No gallbladder wall thickening, or biliary dilatation. Pancreas: Coarse calcifications of the pancreatic uncinate. No pancreatic ductal dilatation or surrounding inflammatory changes. Spleen: Normal in size without significant abnormality. Adrenals/Urinary Tract: Adrenal glands are unremarkable. Kidneys are normal, without renal calculi, solid lesion, or hydronephrosis. Bladder is unremarkable. Stomach/Bowel: Stomach is within normal limits. Appendix appears normal. 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 Vascular/Lymphatic: No significant vascular findings are present. No enlarged abdominal or pelvic lymph nodes. Reproductive: Numerous bilateral ovarian and uterine varices. The left ovarian vein is enlarged, measuring 1.4 cm at the confluence with the left renal vein. Other: No abdominal wall hernia or abnormality. No abdominopelvic ascites. Musculoskeletal: No acute or significant osseous findings. IMPRESSION: 1.  No acute CT abnormality of the abdomen or pelvis. 2. Heterogeneous and ground-glass opacity of the left lung base (series 4, image 24),  increased compared to prior examination dated 02/18/2019, concerning for infection or aspiration. 3.  Stigmata of cirrhosis with abdominal and pelvic varices. 4. 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 Electronically Signed   By: AEddie CandleM.D.   On: 04/02/2019 17:46      LOS: 1 day   Time spent: More than 50% of that time was spent in counseling and/or coordination of care.  RAntonieta Pert MD [facility]  04/04/2019, 11:18 AM

## 2019-04-04 NOTE — Progress Notes (Signed)
Pt becoming increasingly agitated. CIWA is 30. Pt starting to have auditory and visual hallucinations. Pt very confused and starting to become agitated. MD was notified of change in status and CIWA score. Will continue with administering librium and ativan. Tele sitter will be initiated. Will continue to monitor pt. Ramonia Mcclaran W Pedro [place], RN

## 2019-04-04 NOTE — Progress Notes (Signed)
Overall, there really has not been much change.  Her CBC was not done today as far as I can tell.  We will have to see what the result is tomorrow.  She has had no obvious bleeding.  She is eating.  She is had no issues with fever.  Patient does have a couple small tremors.  I am sure this probably has to be indicative of alcohol withdrawal.  She really needs to go to rehab to try to stop drinking.  If not, she will clearly succumb to her alcohol use and her bone marrow will be permanently poisoned and damaged.  We will see what her hepatitis C result is.  She says that she was told that it went into remission by itself.  I must say that I really have not heard of that happening.  I guess it could happen.  It will be interesting to see what her bilirubin levels are.  Hopefully there will be coming down.  Hopefully the elevated bilirubin is more related to alcoholic hepatitis.  On her physical exam, her vital signs are all stable.  She is afebrile.  Temperature is 97.8.  Pulse 93.  Blood pressure 110/93.  Her head and neck still show some slight scleral icterus.  There is no oral lesions.  She has no adenopathy in the neck.  Lungs are clear.  Cardiac exam regular rate and rhythm.  Abdomen is soft.  Bowel sounds are present.  Extremities does show the ecchymoses which might be slightly improved.  Neurological exam shows little bit of a tremor.  We will have to just see how her platelet count looks tomorrow.  She got Nplate yesterday.  I still think that the thrombocytopenia is more related to alcohol use then to immune-based issues from the Hepatitis C.  Ultimately, it would be a matter whether or not she will stop drinking.  Lattie Haw, MD  Oswaldo Milian [phone]

## 2019-04-05 LAB — CBC
HCT: 37.4 % (ref 36.0–46.0)
Hemoglobin: 12.2 g/dL (ref 12.0–15.0)
MCH: 33 pg (ref 26.0–34.0)
MCHC: 32.6 g/dL (ref 30.0–36.0)
MCV: 101.1 fL — ABNORMAL HIGH (ref 80.0–100.0)
Platelets: 13 10*3/uL — CL (ref 150–400)
RBC: 3.7 MIL/uL — ABNORMAL LOW (ref 3.87–5.11)
RDW: 18.9 % — ABNORMAL HIGH (ref 11.5–15.5)
WBC: 1.7 10*3/uL — ABNORMAL LOW (ref 4.0–10.5)
nRBC: 0 % (ref 0.0–0.2)

## 2019-04-05 LAB — COMPREHENSIVE METABOLIC PANEL
ALT: 30 U/L (ref 0–44)
AST: 66 U/L — ABNORMAL HIGH (ref 15–41)
Albumin: 2.8 g/dL — ABNORMAL LOW (ref 3.5–5.0)
Alkaline Phosphatase: 157 U/L — ABNORMAL HIGH (ref 38–126)
Anion gap: 9 (ref 5–15)
BUN: 6 mg/dL (ref 6–20)
CO2: 20 mmol/L — ABNORMAL LOW (ref 22–32)
Calcium: 8.4 mg/dL — ABNORMAL LOW (ref 8.9–10.3)
Chloride: 107 mmol/L (ref 98–111)
Creatinine, Ser: 0.55 mg/dL (ref 0.44–1.00)
GFR calc Af Amer: >60 mL/min (ref >60)
GFR calc non Af Amer: >60 mL/min (ref >60)
Glucose, Bld: 114 mg/dL — ABNORMAL HIGH (ref 70–99)
Potassium: 3.5 mmol/L (ref 3.5–5.1)
Sodium: 136 mmol/L (ref 135–145)
Total Bilirubin: 2.1 mg/dL — ABNORMAL HIGH (ref 0.3–1.2)
Total Protein: 7 g/dL (ref 6.5–8.1)

## 2019-04-05 MED ORDER — METHYLPREDNISOLONE SODIUM SUCC 125 MG IJ SOLR
125.0000 mg | Freq: Every day | INTRAMUSCULAR | Status: AC
  Administered 2019-04-05 – 2019-04-06 (×2): 125 mg via INTRAVENOUS
  Filled 2019-04-05 (×2): qty 2

## 2019-04-05 MED ORDER — IMMUNE GLOBULIN (HUMAN) 10 GM/100ML IV SOLN
1.0000 g/kg | INTRAVENOUS | Status: AC
  Administered 2019-04-05 – 2019-04-06 (×2): 50 g via INTRAVENOUS
  Filled 2019-04-05 (×2): qty 400

## 2019-04-05 NOTE — Evaluation (Signed)
Physical Therapy Evaluation Patient Details Name: Briana Sweeney MRN: [id number] DOB: 05-31-1971 Today's Date: 04/05/2019   History of Present Illness  Patient is a 48 y.o. female, with PMH significant for: polysubstance abuse, chronic alcoholism, hepatitis C, liver cirrhosis, chronic pancytopenia, bipolar disorder with anxiety features. She was brought to ED on 5/13 from home after a fall. Patient has continued to drink alcohol despite her advanced liver disease. She was apparently at a friend's house after drinking excessively when she fell and hit her head.  Patient lost some consciousness briefly. Pt c/o easy bruising, dizziness, and fall 4 days ago where she hit her head on bathtub.  Pt also notes coffee ground emesis yesterday x4.  Pt states that she was trying to drink and just kept throwing up.(HPI from progress note on 04/05/19 per Antonieta Pert, MD)  Clinical Impression   [name] is a 48 y.o. female admitted for above history and diagnosis. She is received supine in bed with tele-sitter in room. She is agreeable to participate in therapy and was able to perform be mobility independently today. Patient reports she is independent at baseline with no device to mobilize and today required min guard to min assist for sit<>stand transfers and gait with no device. She was unsteady during gait and with scissoring step pattern and required Min assist on 3 occasions to prevent LOB. Patient has significant fall history and currently requires supervision and min assist for mobility at all times. She denies having any family or friends who can help her if she returns home. She also has 2 full flights of stairs to ascend to her 3rd floor apartment. At this time patient will benefit from continued therapy at below venue to progress mobility for safety. Acute PT will follow to address impairments and progress mobility as able.     Follow Up Recommendations Supervision for mobility/OOB;SNF(pt may be able to  transition to HHPT if balance and mobility progresses in acute care; unsafe at this time due to decreased caregiver support)    Equipment Recommendations  None recommended by PT    Recommendations for Other Services       Precautions / Restrictions Precautions Precautions: Fall Restrictions Weight Bearing Restrictions: No      Mobility  Bed Mobility Overal bed mobility: Independent        General bed mobility comments: independent from flat bed without use of bed rails  Transfers Overall transfer level: Needs assistance Equipment used: None Transfers: Sit to/from Stand Sit to Stand: Min guard         General transfer comment: pt with some tremors during mobility and unsteady upon standing  Ambulation/Gait Ambulation/Gait assistance: Min guard Gait Distance (Feet): 70 Feet Assistive device: None Gait Pattern/deviations: Step-through pattern;Decreased stride length;Decreased step length - right;Decreased step length - left;Scissoring;Drifts right/left;Narrow base of support Gait velocity: slow and unsteady          Balance Overall balance assessment: Needs assistance Sitting-balance support: Feet supported;No upper extremity supported Sitting balance-Leahy Scale: Good Sitting balance - Comments: pt abel to perform toileting hygiene tasks with 1 UE support on grab bar     Standing balance-Leahy Scale: Fair Standing balance comment: pt with poor standing balance due to tremors/shaking during mobility. requires min guard/assist to maintain balance in standing and during gait with tendecney to take scissoring steps             High level balance activites: Turns;Backward walking;Direction changes High Level Balance Comments: pt required min guard through forward  gait and up to min assis toprevent LOB during dynamic gait including turns, and backward walking to negotiate obstacles in hospital room             Pertinent Vitals/Pain Pain Assessment:  No/denies pain    Home Living Family/patient expects to be discharged to:: Private residence Living Arrangements: Alone(chart reveiw indicated pt lives with significant other, she states she lives alone) Available Help at Discharge: (pt claims no family/friends available to assist her if needed) Type of Home: Apartment Home Access: Stairs to enter Entrance Stairs-Rails: Right Entrance Stairs-Number of Steps: pt lives in 3rd floor apartment uses Rt hand rail, ~ 30-40 stairs to enter home Home Layout: One level Home Equipment: None      Prior Function Level of Independence: Independent         Comments: pt ambulates with no device at baseline and is independent with ADL's, she does not drive and uses the bus for public transportation     Hand Dominance   Dominant Hand: Right    Extremity/Trunk Assessment   Upper Extremity Assessment Upper Extremity Assessment: Overall WFL for tasks assessed    Lower Extremity Assessment Lower Extremity Assessment: Generalized weakness    Cervical / Trunk Assessment Cervical / Trunk Assessment: Normal  Communication   Communication: No difficulties  Cognition Arousal/Alertness: Awake/alert Behavior During Therapy: WFL for tasks assessed/performed Overall Cognitive Status: Within Functional Limits for tasks assessed                    Assessment/Plan    PT Assessment Patient needs continued PT services  PT Problem List Decreased strength;Decreased balance;Decreased mobility;Decreased activity tolerance;Decreased safety awareness       PT Treatment Interventions Gait training;DME instruction;Functional mobility training;Balance training;Patient/family education;Therapeutic activities;Stair training;Therapeutic exercise    PT Goals (Current goals can be found in the Care Plan section)  Acute Rehab PT Goals Patient Stated Goal: no specific goals stated by patient, she wants to stop falling backwards so much Time For Goal  Achievement: 04/12/19 Potential to Achieve Goals: Good    Frequency Min 3X/week   Barriers to discharge Decreased caregiver support pt denies having family or friends available to assist her with ADL's and mobility at home       AM-PAC PT "6 Clicks" Mobility  Outcome Measure Help needed turning from your back to your side while in a flat bed without using bedrails?: A Little Help needed moving from lying on your back to sitting on the side of a flat bed without using bedrails?: A Little Help needed moving to and from a bed to a chair (including a wheelchair)?: A Little Help needed standing up from a chair using your arms (e.g., wheelchair or bedside chair)?: A Little Help needed to walk in hospital room?: A Little Help needed climbing 3-5 steps with a railing? : A Lot 6 Click Score: 17    End of Session Equipment Utilized During Treatment: Gait belt Activity Tolerance: Patient tolerated treatment well Patient left: in bed;with call bell/phone within reach;with bed alarm set Nurse Communication: Mobility status PT Visit Diagnosis: Unsteadiness on feet (R26.81);Other abnormalities of gait and mobility (R26.89);History of falling (Z91.81);Muscle weakness (generalized) (M62.81);Difficulty in walking, not elsewhere classified (R26.2)    Time: 6720-9470 PT Time Calculation (min) (ACUTE ONLY): 23 min   Charges:   PT Evaluation $PT Eval Low Complexity: 1 Low PT Treatments $Gait Training: 8-22 mins        Kipp Brood, PT, DPT, [facility] Physical Therapist with  [facility]  04/05/2019 1:09 PM

## 2019-04-05 NOTE — Progress Notes (Signed)
Sounds like Ms. Lashway is starting to have withdrawals.  She is having more erratic behavior.  She is having some loose Nations.  This morning when I saw her, she was asleep.  I really think that her thrombocytopenia is more from marrow toxicity from her drinking and her cirrhosis.  We are still checking on her Hepatitis C levels.  Her labs today show her white cell count to be 1.7.  Hemoglobin 12.2.  Platelet count 13,000.  She got Nplate a couple days ago.  If this is going to work, I will think we should see at work in a couple more days.  I do not want to give her Decadron because of the possible gastritis and may be because it could also exacerbate any psychological issues that she might have.  Her liver tests are getting better.  Her bilirubin is now 2.1.  She has had no obvious bleeding.  She still has the bruising.  For her thrombocytopenia, I will now try her on some IVIG.  I think this would be reasonable and this could certainly let us know if there is any immune component to her thrombocytopenia.  I will give the IVIG over 2 days.  I am not sure she could handle 1 big dose of IVIG.  Again, her alcohol use is clearly her biggest problem.  She is having withdrawals.  She needs some kind of rehab to try to get her off alcohol if possible.  Lattie Haw, MD  [place] 8:7

## 2019-04-05 NOTE — Plan of Care (Signed)
  Problem: Acute Rehab PT Goals(only PT should resolve) Goal: Patient Will Transfer Sit To/From Stand Outcome: Progressing Flowsheets (Taken 04/05/2019 1316) Patient will transfer sit to/from stand: Independently Goal: Pt Will Transfer Bed To Chair/Chair To Bed Outcome: Progressing Flowsheets (Taken 04/05/2019 1316) Pt will Transfer Bed to Chair/Chair to Bed: Independently Goal: Pt Will Ambulate Outcome: Progressing Flowsheets (Taken 04/05/2019 1316) Pt will Ambulate:  > 125 feet  with supervision Goal: Pt Will Go Up/Down Stairs Outcome: Progressing Flowsheets (Taken 04/05/2019 1316) Pt will Go Up / Down Stairs:  Flight  with supervision  with rail(s)

## 2019-04-05 NOTE — Progress Notes (Signed)
PROGRESS NOTE    Briana Sweeney  SUO:[id number] DOB: 1971/01/26 DOA: 04/02/2019 PCP: [name], FNP   Brief Narrative: Per HPI:  48 y.o. female,   polysubstance abuse,chronic alcoholism, hepatitis C, liver cirrhosis, chronic pancytopenia, bipolar disorder with anxiety features brought to the ED on 5/13 from home after a fall.Patient has continued to drink alcohol despite her advanced liver disease. She was apparently at a friend's house after drinking excessively when she fell and hit her head. Patient lost some consciousness briefly. Pt c/o easy bruising, dizziness, and fall 4 days ago where she hit her head on bathtub.  Pt also notes coffee ground emesis yesterday x4.  Pt states that she was trying to drink and just kept throwing up.  Pt notes slight brbpr 1 week ago.  "not too much". " just happens from time to time"  Denies fever, chills, cough, cp, palp, sob, abd pain, diarrhea, brbpr today.   In ED, VSS, CT brain- no acute abnormality. 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 labs with platelets of 11k, abnormal lfts Ast 148, Alt 49, Alk phos 202, T. Bili 4.8, INR 1.3, Urinalysis negative,Covid-19 negative Hem/onc consulted and admitted Status post on unit platelet transfusion then Nplate was given 3/27 Starting immunoglobulin and a steroid.  Remains on CIWA Ativan and Librium taper.  Subjective: Mildly tremulous.  Alert awake oriented to place, year but not to month current president. Denies any complaint.  Reports she is not able to sleep much.  Assessment & Plan:   Profound Thrombocytopenia-suspecting from marrow toxicity due to alcohol liver  cirrhosis. S/p plt transfusion- plt up at [facility] but again low. Hem/onc on  board, s/p Nplate 7/25. And 7/27-start on IV steroid and immunoglobulin infusion appreciate input. Recent Labs  Lab 04/02/19 1608 04/03/19 0457 04/04/19 0728 04/05/19 0420  PLT 11* 15* 12* 13*   Leukopenia:likely with chronic alcoholism marrow suppression.  Monitor.  Hypokalemia;repleted and resolved. Recent Labs  Lab 04/02/19 1608 04/03/19 0457 04/04/19 0442 04/05/19 0420  K 3.3* 2.8* 4.0 3.5   Hyponatremia-improved  Fall/dizzxiness-likely due to Etoh use/withdrawl.orthostatic hypotension on standing 113/93 and on sitting 134/93:borderline positive,.  so is started on IV fluids.CT head neg. assist with his multifactorial with alcohol abuse decreased oral intake.  Hepatitis C- checking viral load.  Alcoholic cirrhosis of liver with ascites w e/o esophageal varices in CT: had paracentesis 2 yrs ago per her and had significant abd distension. abdomen not distended, non tender.  Coffee ground emesis- recent black stool 4 days PTA, no BRBPR: Hb stable tho some drop. CONT PPI BID. likley form gastritis. Has varices in imaging. I discussed with Dr Paulita Fujita from GI, who knows her from previous admission and has had multiple admissions for similar presentation and her last endoscopy in 2018 did not show any active bleeding but grade 2 esophageal varices.  At this time we will continue monitor and call GI on PRN basis, no indication for procedure unless patient has active bleeding.  Last EGD report attached below. Recent Labs  Lab 04/02/19 1608 04/03/19 0457 04/04/19 0728 04/05/19 0420  HGB 14.0 12.1 12.1 12.2  HCT 43.5 36.5 37.3 37.4   Alcohol abuse with withdrawal, remains on Librium taper, CIWA Ativan, telemetry sitter.  Continue thiamine  and vitamins. last etoh 3 days PTA  GERD- cont pi  BPD/MDD : Nervous. Cont gabapentin,prozac,trazodone  Seizure disorder on dilantin, level low- non compliance at times per  her.  DVT prophylaxis: SCD Code Status: full code  Family Communication: plan of care discussed with patient in detail.  Disposition Plan: Remains inpatient pending clinical improvement.  Increase PT OT.   Consultants:  Hem/Onc  Procedures:  Microbiology: covid 19 neg  EGD 08/2017  Grade II esophageal varices. - Portal hypertensive gastropathy. - Normal examined duodenum. - No specimens collected.  Antimicrobials: Anti-infectives (From admission, onward)   None       Objective: Vitals:   04/04/19 1830 04/04/19 2345 04/05/19 0500 04/05/19 0536  BP: (!) 119/99 (!) 122/96  (!) 123/94  Pulse: 87 85  77  Resp: '20 16  16  '$ Temp: 97.8 F (36.6 C) 98.6 F (37 C)  98.9 F (37.2 C)  TempSrc: Axillary Oral  Oral  SpO2: 97% 98%  95%  Weight:   56.7 kg   Height:        Intake/Output Summary (Last 24 hours) at 04/05/2019 1024 Last data filed at 04/05/2019 0700 Gross per 24 hour  Intake 3476.27 ml  Output -  Net 3476.27 ml   Filed Weights   04/02/19 2100 04/03/19 0434 04/05/19 0500  Weight: 58.7 kg 50.4 kg 56.7 kg   Weight change:   Body mass index is 24.41 kg/m.  Intake/Output from previous day: 07/26 0701 - 07/27 0700 In: 3716.3 [P.O.:1320; I.V.:2396.3] Out: -  Intake/Output this shift: No intake/output data recorded.  Examination: General exam: AAOx2,NAD, weak/frail, tremulous, sleepy. HEENT:Oral mucosa moist, Ear/Nose WNL grossly, dentition normal. Respiratory system: Diminished at the base,no wheezing or crackles, NT,no use of accessory muscle Cardiovascular system: S1 & S2 +, No JVD, regular RR. Gastrointestinal system: Abdomen soft, NT, mildly distended, BS+ Nervous System: Somewhat sleepy tremulous, able to move all her extremities.   Extremities: No edema, distal peripheral pulses palpable.  Skin: No rashes,no icterus. MSK: Normal muscle bulk,tone, power  Medications:  Scheduled Meds: . chlordiazePOXIDE  25 mg Oral TID   Followed by  .  chlordiazePOXIDE  25 mg Oral BH-qamhs   Followed by  . [START ON 04/06/2019] chlordiazePOXIDE  25 mg Oral Daily  . FLUoxetine  40 mg Oral Daily  . folic acid  1 mg Oral Daily  . gabapentin  600 mg Oral BID  . lactulose  20 g Oral TID  . LORazepam  0-4 mg Intravenous Q12H  . methylPREDNISolone (SOLU-MEDROL) injection  125 mg Intravenous Daily  . multivitamin with minerals  1 tablet Oral Daily  . pantoprazole  40 mg Oral BID  . phenytoin  300 mg Oral Daily  . sodium chloride flush  3 mL Intravenous Q12H  . thiamine  100 mg Oral Daily   Or  . thiamine  100 mg Intravenous Daily  . topiramate  50 mg Oral Daily  . traZODone  100 mg Oral QHS   Continuous Infusions: . sodium chloride    . sodium chloride    . sodium chloride 125 mL/hr at 04/05/19 0220  . IMMUNE GLOBULIN 10% (HUMAN) IV - For Fluid Restriction Only      Data Reviewed: I have personally reviewed following labs and imaging studies  CBC: Recent Labs  Lab 04/02/19 1608 04/03/19 0457 04/04/19 0728 04/05/19 0420  WBC 3.9* 2.5* 1.8* 1.7*  NEUTROABS 1.6*  --  0.6*  --   HGB 14.0 12.1 12.1 12.2  HCT  43.5 36.5 37.3 37.4  MCV 99.5 98.4 100.3* 101.1*  PLT 11* 15* 12* 13*   Basic Metabolic Panel: Recent Labs  Lab 04/02/19 1608 04/03/19 0457 04/04/19 0442 04/05/19 0420  NA 134* 138 137 136  K 3.3* 2.8* 4.0 3.5  CL 96* 101 106 107  CO2 19* 24 20* 20*  GLUCOSE 137* 88 92 114*  BUN '7 7 6 6  '$ CREATININE 0.72 0.52 0.50 0.55  CALCIUM 9.4 8.7* 8.9 8.4*   GFR: Estimated Creatinine Clearance: 69.4 mL/min (by C-G formula based on SCr of 0.55 mg/dL). Liver Function Tests: Recent Labs  Lab 04/02/19 1608 04/03/19 0457 04/05/19 0420  AST 148* 95* 66*  ALT 49* 39 30  ALKPHOS 202* 163* 157*  BILITOT 4.8* 3.9* 2.1*  PROT 8.9* 7.5 7.0  ALBUMIN 3.7 3.2* 2.8*   Recent Labs  Lab 04/02/19 1608  LIPASE 62*   No results for input(s): AMMONIA in the last 168 hours. Coagulation Profile: Recent Labs  Lab 04/02/19 1608   INR 1.3*   Cardiac Enzymes: No results for input(s): CKTOTAL, CKMB, CKMBINDEX, TROPONINI in the last 168 hours. BNP (last 3 results) No results for input(s): PROBNP in the last 8760 hours. HbA1C: No results for input(s): HGBA1C in the last 72 hours. CBG: No results for input(s): GLUCAP in the last 168 hours. Lipid Profile: No results for input(s): CHOL, HDL, LDLCALC, TRIG, CHOLHDL, LDLDIRECT in the last 72 hours. Thyroid Function Tests: No results for input(s): TSH, T4TOTAL, FREET4, T3FREE, THYROIDAB in the last 72 hours. Anemia Panel: No results for input(s): VITAMINB12, FOLATE, FERRITIN, TIBC, IRON, RETICCTPCT in the last 72 hours. Sepsis Labs: No results for input(s): PROCALCITON, LATICACIDVEN in the last 168 hours.  Recent Results (from the past 240 hour(s))  SARS Coronavirus 2 (CEPHEID - Performed in [place] hospital lab), Hosp Order     Status: None   Collection Time: 04/02/19  5:52 PM   Specimen: Urine, Clean Catch; Nasopharyngeal  Result Value Ref Range Status   SARS Coronavirus 2 NEGATIVE NEGATIVE Final    Comment: (NOTE) If result is NEGATIVE SARS-CoV-2 target nucleic acids are NOT DETECTED. The SARS-CoV-2 RNA is generally detectable in upper and lower  respiratory specimens during the acute phase of infection. The lowest  concentration of SARS-CoV-2 viral copies this assay can detect is 250  copies / mL. A negative result does not preclude SARS-CoV-2 infection  and should not be used as the sole basis for treatment or other  patient management decisions.  A negative result may occur with  improper specimen collection / handling, submission of specimen other  than nasopharyngeal swab, presence of viral mutation(s) within the  areas targeted by this assay, and inadequate number of viral copies  (<250 copies / mL). A negative result must be combined with clinical  observations, patient history, and epidemiological information. If result is POSITIVE SARS-CoV-2  target nucleic acids are DETECTED. The SARS-CoV-2 RNA is generally detectable in upper and lower  respiratory specimens dur ing the acute phase of infection.  Positive  results are indicative of active infection with SARS-CoV-2.  Clinical  correlation with patient history and other diagnostic information is  necessary to determine patient infection status.  Positive results do  not rule out bacterial infection or co-infection with other viruses. If result is PRESUMPTIVE POSTIVE SARS-CoV-2 nucleic acids MAY BE PRESENT.   A presumptive positive result was obtained on the submitted specimen  and confirmed on repeat testing.  While 2019 novel coronavirus  (SARS-CoV-2)  nucleic acids may be present in the submitted sample  additional confirmatory testing may be necessary for epidemiological  and / or clinical management purposes  to differentiate between  SARS-CoV-2 and other Sarbecovirus currently known to infect humans.  If clinically indicated additional testing with an alternate test  methodology [phone]) is advised. The SARS-CoV-2 RNA is generally  detectable in upper and lower respiratory sp ecimens during the acute  phase of infection. The expected result is Negative. Fact Sheet for Patients:  StrictlyIdeas.no Fact Sheet for Healthcare Providers: BankingDealers.co.za This test is not yet approved or cleared by the [place] FDA and has been authorized for detection and/or diagnosis of SARS-CoV-2 by FDA under an Emergency Use Authorization (EUA).  This EUA will remain in effect (meaning this test can be used) for the duration of the COVID-19 declaration under Section 564(b)(1) of the Act, 21 U.S.C. section 360bbb-3(b)(1), unless the authorization is terminated or revoked sooner. Performed at [facility], [place] [address], [place], [place] [address]       Radiology Studies: No results found.    LOS: 2 days    Time spent: More than 50% of that time was spent in counseling and/or coordination of care.  Antonieta Pert, MD [facility]  04/05/2019, 10:24 AM

## 2019-04-06 DIAGNOSIS — F1014 Alcohol abuse with alcohol-induced mood disorder: Secondary | ICD-10-CM

## 2019-04-06 LAB — CBC
HCT: 37.2 % (ref 36.0–46.0)
Hemoglobin: 11.6 g/dL — ABNORMAL LOW (ref 12.0–15.0)
MCH: 31.7 pg (ref 26.0–34.0)
MCHC: 31.2 g/dL (ref 30.0–36.0)
MCV: 101.6 fL — ABNORMAL HIGH (ref 80.0–100.0)
Platelets: 13 10*3/uL — CL (ref 150–400)
RBC: 3.66 MIL/uL — ABNORMAL LOW (ref 3.87–5.11)
RDW: 18.7 % — ABNORMAL HIGH (ref 11.5–15.5)
WBC: 2.4 10*3/uL — ABNORMAL LOW (ref 4.0–10.5)
nRBC: 0 % (ref 0.0–0.2)

## 2019-04-06 LAB — COMPREHENSIVE METABOLIC PANEL
ALT: 27 U/L (ref 0–44)
AST: 53 U/L — ABNORMAL HIGH (ref 15–41)
Albumin: 2.7 g/dL — ABNORMAL LOW (ref 3.5–5.0)
Alkaline Phosphatase: 143 U/L — ABNORMAL HIGH (ref 38–126)
Anion gap: 5 (ref 5–15)
BUN: 6 mg/dL (ref 6–20)
CO2: 19 mmol/L — ABNORMAL LOW (ref 22–32)
Calcium: 8.4 mg/dL — ABNORMAL LOW (ref 8.9–10.3)
Chloride: 110 mmol/L (ref 98–111)
Creatinine, Ser: 0.5 mg/dL (ref 0.44–1.00)
GFR calc Af Amer: >60 mL/min (ref >60)
GFR calc non Af Amer: >60 mL/min (ref >60)
Glucose, Bld: 115 mg/dL — ABNORMAL HIGH (ref 70–99)
Potassium: 3.3 mmol/L — ABNORMAL LOW (ref 3.5–5.1)
Sodium: 134 mmol/L — ABNORMAL LOW (ref 135–145)
Total Bilirubin: 1.6 mg/dL — ABNORMAL HIGH (ref 0.3–1.2)
Total Protein: 8.1 g/dL (ref 6.5–8.1)

## 2019-04-06 LAB — CBC WITH DIFFERENTIAL/PLATELET
Abs Immature Granulocytes: 0 10*3/uL (ref 0.00–0.07)
Basophils Absolute: 0 10*3/uL (ref 0.0–0.1)
Basophils Relative: 0 %
Eosinophils Absolute: 0 10*3/uL (ref 0.0–0.5)
Eosinophils Relative: 0 %
HCT: 38.1 % (ref 36.0–46.0)
Hemoglobin: 12.3 g/dL (ref 12.0–15.0)
Immature Granulocytes: 0 %
Lymphocytes Relative: 21 %
Lymphs Abs: 0.2 10*3/uL — ABNORMAL LOW (ref 0.7–4.0)
MCH: 33.2 pg (ref 26.0–34.0)
MCHC: 32.3 g/dL (ref 30.0–36.0)
MCV: 103 fL — ABNORMAL HIGH (ref 80.0–100.0)
Monocytes Absolute: 0.1 10*3/uL (ref 0.1–1.0)
Monocytes Relative: 7 %
Neutro Abs: 0.7 10*3/uL — ABNORMAL LOW (ref 1.7–7.7)
Neutrophils Relative %: 72 %
Platelets: 16 10*3/uL — CL (ref 150–400)
RBC: 3.7 MIL/uL — ABNORMAL LOW (ref 3.87–5.11)
RDW: 19.1 % — ABNORMAL HIGH (ref 11.5–15.5)
WBC: 1 10*3/uL — CL (ref 4.0–10.5)
nRBC: 0 % (ref 0.0–0.2)

## 2019-04-06 MED ORDER — ALUM & MAG HYDROXIDE-SIMETH 200-200-20 MG/5ML PO SUSP
15.0000 mL | ORAL | Status: DC | PRN
  Administered 2019-04-08: 15 mL via ORAL
  Filled 2019-04-06: qty 30

## 2019-04-06 MED ORDER — POTASSIUM CHLORIDE CRYS ER 20 MEQ PO TBCR
40.0000 meq | EXTENDED_RELEASE_TABLET | Freq: Once | ORAL | Status: AC
  Administered 2019-04-06: 20:00:00 40 meq via ORAL
  Filled 2019-04-06: qty 2

## 2019-04-06 NOTE — Progress Notes (Signed)
PROGRESS NOTE    Memory Heinrichs  NUU:[id number] DOB: [birth year] DOA: 04/02/2019 PCP: [name], FNP    Brief Narrative:   Per HPI:  48 y.o.female,polysubstance abuse,chronic alcoholism, hepatitis C, liver cirrhosis, chronic pancytopenia, bipolar disorder with anxiety features brought to the ED on 5/13 from home after a fall.Patient has continued to drink alcohol despite her advanced liver disease. She was apparently at a friend's house after drinking excessively when she fell and hit her head. Patient lost some consciousness briefly. Pt c/o easy bruising, dizziness, and fall 4 days ago where she hit her head on bathtub.   In ED,VSS, CT brain- no acute abnormality. 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 labs with platelets of 11k, abnormal lfts Ast 148, Alt 49, Alk phos 202, T. Bili 4.8, INR 1.3, Urinalysis negative,Covid-19 negative Hem/onc consulted and admitted Status post on unit platelet transfusion then Nplate was given 3/47 Starting immunoglobulin and a steroid.  Remains on CIWA Ativan and Librium taper.  Assessment & Plan:   Principal Problem:   Thrombocytopenia ([place]) Active Problems:   Hypokalemia   Alcoholic cirrhosis of liver with ascites (HCC)   Alcohol abuse with alcohol-induced mood disorder (HCC)   MDD (major depressive disorder), severe (HCC)   Alcohol dependence (HCC)   Dizziness   Bruising   Hyponatremia    Severe thrombocytopenia Probably secondary to marrow toxicity from alcohol liver cirrhosis> S/p platelet transfusion but without any improvement in platelet count. Hematology on board and she underwent endplate  infusion and IV steroids and IVIG started on 04/05/2019. Platelets continue to be stable around 13,000. Watch for spontaneous bleeding.     Hypokalemia Replaced    EtOH abuse/alcohol withdrawal Continue with CIWA    Hyponatremia Improved.    Hepatitis C: Viral load pending.    Alcoholic cirrhosis with ascites and esophageal varices on CT  No indication for paracentesis.    Coffee ground emesis:  - resolved.  - resume PPI BID.    Gerd: Continue with PPI.   Seizures:  Resume dilantin.       DVT prophylaxis: scd's Code Status: full code.  Family Communication: none at bedside.  Disposition Plan:pending clinical improvement and improvement in platelet count.   Consultants:   Hematology.   Procedures: none.    Antimicrobials: none.    Subjective: Continues to have bruising. Pt reports not feeling good.   Objective: Vitals:   04/06/19 1234 04/06/19 1256 04/06/19 1330 04/06/19 1356  BP: (!) 139/99 119/86 106/76 (!) 131/105  Pulse: 78 83 77 78  Resp: 20 19 (!) 22 (!) 22  Temp: 98 F (36.7 C) 98.4 F (36.9 C) 98.6 F (37 C) 97.6 F (36.4 C)  TempSrc: Oral Oral Oral Oral  SpO2: 94% 93% 94% 93%  Weight:      Height:        Intake/Output Summary (Last 24 hours) at 04/06/2019 1653 Last data filed at 04/06/2019 1635 Gross per 24 hour  Intake 2542.71 ml  Output 4600 ml  Net -2057.29 ml   Filed Weights   04/03/19 0434 04/05/19 0500 04/06/19 0410  Weight: 50.4 kg 56.7 kg 57.1 kg    Examination:  General exam: Appears calm and comfortable  Respiratory system: Clear to auscultation. Respiratory effort  normal. Cardiovascular system: S1 & S2 heard, RRR.[name] Kitchen No pedal edema. Gastrointestinal system: Abdomen is Central nervous system: Alert and oriented. Non focal.  Extremities: Symmetric 5 x 5 power. Skin: bruising, and petechiae present.  Psychiatry: . Mood & affect appropriate.     Data Reviewed: I have personally reviewed following labs and  imaging studies  CBC: Recent Labs  Lab 04/02/19 1608 04/03/19 0457 04/04/19 0728 04/05/19 0420 04/06/19 0350  WBC 3.9* 2.5* 1.8* 1.7* 2.4*  NEUTROABS 1.6*  --  0.6*  --   --   HGB 14.0 12.1 12.1 12.2 11.6*  HCT 43.5 36.5 37.3 37.4 37.2  MCV 99.5 98.4 100.3* 101.1* 101.6*  PLT 11* 15* 12* 13* 13*   Basic Metabolic Panel: Recent Labs  Lab 04/02/19 1608 04/03/19 0457 04/04/19 0442 04/05/19 0420 04/06/19 0350  NA 134* 138 137 136 134*  K 3.3* 2.8* 4.0 3.5 3.3*  CL 96* 101 106 107 110  CO2 19* 24 20* 20* 19*  GLUCOSE 137* 88 92 114* 115*  BUN _0 CREATININE 0.72 0.52 0.50 0.55 0.50  CALCIUM 9.4 8.7* 8.9 8.4* 8.4*   GFR: Estimated Creatinine Clearance: 69.5 mL/min (by C-G formula based on SCr of 0.5 mg/dL). Liver Function Tests: Recent Labs  Lab 04/02/19 1608 04/03/19 0457 04/05/19 0420 04/06/19 0350  AST 148* 95* 66* 53*  ALT 49* 39 30 27  ALKPHOS 202* 163* 157* 143*  BILITOT 4.8* 3.9* 2.1* 1.6*  PROT 8.9* 7.5 7.0 8.1  ALBUMIN 3.7 3.2* 2.8* 2.7*   Recent Labs  Lab 04/02/19 1608  LIPASE 62*   No results for input(s): AMMONIA in the last 168 hours. Coagulation Profile: Recent Labs  Lab 04/02/19 1608  INR 1.3*   Cardiac Enzymes: No results for input(s): CKTOTAL, CKMB, CKMBINDEX, TROPONINI in the last 168 hours. BNP (last 3 results) No results for input(s): PROBNP in the last 8760 hours. HbA1C: No results for input(s): HGBA1C in the last 72 hours. CBG: No results for input(s): GLUCAP in the last 168 hours. Lipid Profile: No results for input(s): CHOL, HDL, LDLCALC, TRIG, CHOLHDL, LDLDIRECT in the last 72 hours. Thyroid Function Tests: No results for input(s): TSH, T4TOTAL, FREET4, T3FREE, THYROIDAB in the last 72 hours. Anemia Panel: No results for input(s): VITAMINB12, FOLATE, FERRITIN, TIBC, IRON, RETICCTPCT in the last 72 hours. Sepsis Labs: No results for input(s): PROCALCITON, LATICACIDVEN in the last 168 hours.  Recent Results (from  the past 240 hour(s))  SARS Coronavirus 2 (CEPHEID - Performed in [facility] hospital lab), Hosp Order     Status: None   Collection Time: 04/02/19  5:52 PM   Specimen: Urine, Clean Catch; Nasopharyngeal  Result Value Ref Range Status   SARS Coronavirus 2 NEGATIVE NEGATIVE Final    Comment: (NOTE) If result is NEGATIVE SARS-CoV-2 target nucleic acids are NOT DETECTED. The SARS-CoV-2 RNA is generally detectable in upper and lower  respiratory specimens during the acute phase of infection. The lowest  concentration of SARS-CoV-2 viral copies this assay can detect is 250  copies / mL. A negative result does not preclude SARS-CoV-2 infection  and should not be used as the sole basis for treatment or other  patient management decisions.  A negative result may occur with  improper specimen collection / handling, submission of specimen other  than nasopharyngeal swab, presence of viral mutation(s) within the  areas targeted by this assay, and inadequate number of viral copies  (<250 copies / mL). A negative result  must be combined with clinical  observations, patient history, and epidemiological information. If result is POSITIVE SARS-CoV-2 target nucleic acids are DETECTED. The SARS-CoV-2 RNA is generally detectable in upper and lower  respiratory specimens dur ing the acute phase of infection.  Positive  results are indicative of active infection with SARS-CoV-2.  Clinical  correlation with patient history and other diagnostic information is  necessary to determine patient infection status.  Positive results do  not rule out bacterial infection or co-infection with other viruses. If result is PRESUMPTIVE POSTIVE SARS-CoV-2 nucleic acids MAY BE PRESENT.   A presumptive positive result was obtained on the submitted specimen  and confirmed on repeat testing.  While 2019 novel coronavirus  (SARS-CoV-2) nucleic acids may be present in the submitted sample  additional confirmatory testing may  be necessary for epidemiological  and / or clinical management purposes  to differentiate between  SARS-CoV-2 and other Sarbecovirus currently known to infect humans.  If clinically indicated additional testing with an alternate test  methodology [phone]) is advised. The SARS-CoV-2 RNA is generally  detectable in upper and lower respiratory sp ecimens during the acute  phase of infection. The expected result is Negative. Fact Sheet for Patients:  StrictlyIdeas.no Fact Sheet for Healthcare Providers: BankingDealers.co.za This test is not yet approved or cleared by the [place] FDA and has been authorized for detection and/or diagnosis of SARS-CoV-2 by FDA under an Emergency Use Authorization (EUA).  This EUA will remain in effect (meaning this test can be used) for the duration of the COVID-19 declaration under Section 564(b)(1) of the Act, 21 U.S.C. section 360bbb-3(b)(1), unless the authorization is terminated or revoked sooner. Performed at [facility], [place] [address] [place], [place] [address]          Radiology Studies: No results found.      Scheduled Meds: . chlordiazePOXIDE  25 mg Oral Daily  . FLUoxetine  40 mg Oral Daily  . folic acid  1 mg Oral Daily  . gabapentin  600 mg Oral BID  . lactulose  20 g Oral TID  . multivitamin with minerals  1 tablet Oral Daily  . pantoprazole  40 mg Oral BID  . phenytoin  300 mg Oral Daily  . potassium chloride  40 mEq Oral Once  . sodium chloride flush  3 mL Intravenous Q12H  . thiamine  100 mg Oral Daily   Or  . thiamine  100 mg Intravenous Daily  . topiramate  50 mg Oral Daily  . traZODone  100 mg Oral QHS   Continuous Infusions: . sodium chloride    . sodium chloride    . sodium chloride 125 mL/hr at 04/06/19 1134     LOS: 3 days    Time spent: 32 minutes.     Hosie Poisson, MD [facility] Pager [phone]   If 7PM-7AM,  please contact night-coverage [email or web address] Password [facility] 04/06/2019, 4:53 PM

## 2019-04-06 NOTE — Progress Notes (Addendum)
  CRITICAL VALUE ALERT  Critical Value:  WBC 1, Plt 16  Date & Time Notied: 04-06-2019 @ 2030  Provider Notified: Schorr, NP  Orders Received/Actions taken: No new orders

## 2019-04-07 ENCOUNTER — Inpatient Hospital Stay (HOSPITAL_COMMUNITY): Payer: Medicaid Other

## 2019-04-07 LAB — COMPREHENSIVE METABOLIC PANEL
ALT: 27 U/L (ref 0–44)
AST: 52 U/L — ABNORMAL HIGH (ref 15–41)
Albumin: 2.6 g/dL — ABNORMAL LOW (ref 3.5–5.0)
Alkaline Phosphatase: 137 U/L — ABNORMAL HIGH (ref 38–126)
Anion gap: 8 (ref 5–15)
BUN: 5 mg/dL — ABNORMAL LOW (ref 6–20)
CO2: 18 mmol/L — ABNORMAL LOW (ref 22–32)
Calcium: 8 mg/dL — ABNORMAL LOW (ref 8.9–10.3)
Chloride: 109 mmol/L (ref 98–111)
Creatinine, Ser: 0.49 mg/dL (ref 0.44–1.00)
GFR calc Af Amer: >60 mL/min (ref >60)
GFR calc non Af Amer: >60 mL/min (ref >60)
Glucose, Bld: 92 mg/dL (ref 70–99)
Potassium: 3.7 mmol/L (ref 3.5–5.1)
Sodium: 135 mmol/L (ref 135–145)
Total Bilirubin: 1.1 mg/dL (ref 0.3–1.2)
Total Protein: 8.5 g/dL — ABNORMAL HIGH (ref 6.5–8.1)

## 2019-04-07 LAB — CBC
HCT: 35.8 % — ABNORMAL LOW (ref 36.0–46.0)
Hemoglobin: 11.6 g/dL — ABNORMAL LOW (ref 12.0–15.0)
MCH: 33.2 pg (ref 26.0–34.0)
MCHC: 32.4 g/dL (ref 30.0–36.0)
MCV: 102.6 fL — ABNORMAL HIGH (ref 80.0–100.0)
Platelets: 17 10*3/uL — CL (ref 150–400)
RBC: 3.49 MIL/uL — ABNORMAL LOW (ref 3.87–5.11)
RDW: 19.1 % — ABNORMAL HIGH (ref 11.5–15.5)
WBC: 2.4 10*3/uL — ABNORMAL LOW (ref 4.0–10.5)
nRBC: 0 % (ref 0.0–0.2)

## 2019-04-07 MED ORDER — ACETAMINOPHEN 325 MG PO TABS
650.0000 mg | ORAL_TABLET | ORAL | Status: DC | PRN
  Administered 2019-04-07: 650 mg via ORAL
  Filled 2019-04-07: qty 2

## 2019-04-07 NOTE — Progress Notes (Signed)
PROGRESS NOTE    Briana Sweeney  ENI:[id number] DOB: 01-07-1971 DOA: 04/02/2019 PCP: [name], FNP    Brief Narrative:   Per HPI:  48 y.o.female,polysubstance abuse,chronic alcoholism, hepatitis C, liver cirrhosis, chronic pancytopenia, bipolar disorder with anxiety features brought to the ED on 5/13 from home after a fall.Patient has continued to drink alcohol despite her advanced liver disease. She was apparently at a friend's house after drinking excessively when she fell and hit her head. Patient lost some consciousness briefly. Pt c/o easy bruising, dizziness, and fall 4 days ago where she hit her head on bathtub.   In ED,VSS, CT brain- no acute abnormality. 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 labs with platelets of 11k, abnormal lfts Ast 148, Alt 49, Alk phos 202, T. Bili 4.8, INR 1.3, Urinalysis negative,Covid-19 negative Hem/onc consulted and admitted Status post on unit platelet transfusion then Nplate was given 6/14 Starting immunoglobulin and a steroid.  Remains on CIWA Ativan and Librium taper.  Assessment & Plan:   Principal Problem:   Thrombocytopenia ([place]) Active Problems:   Hypokalemia   Alcoholic cirrhosis of liver with ascites (HCC)   Alcohol abuse with alcohol-induced mood disorder (HCC)   MDD (major depressive disorder), severe (HCC)   Alcohol dependence (HCC)   Dizziness   Bruising   Hyponatremia    Severe thrombocytopenia Probably secondary to marrow toxicity from alcohol liver cirrhosis> S/p platelet transfusion but without any improvement in platelet count. Hematology on board and she underwent endplate  infusion and IV steroids and IVIG started on 04/05/2019. Platelets  Slowly rising, today at 17,000 . Watch for spontaneous bleeding.    Persistent dizziness and headache,  Get MRi brain to evaluate .  No blurry vision.  Pt has a h/o of chronic alcohol abuse and has unsteady gait.     Flank pain and back pain:  In view of the severe thrombocytopenia:  Please check for retroperitoneal bleeding. Her H&H is stable around 11.6 this am.     Hypokalemia Replaced    EtOH abuse/alcohol withdrawal Continue with CIWA    Hyponatremia Improved.    Hepatitis C: Viral load pending.    Alcoholic cirrhosis with ascites and esophageal varices on CT  No indication for paracentesis.    Coffee ground emesis:  - resolved.  - resume PPI BID.    Gerd: Continue with PPI.   Seizures:  Resume dilantin.       DVT prophylaxis: scd's Code Status: full code.  Family Communication: none at bedside.  Disposition Plan:pending clinical improvement and improvement in platelet count.   Consultants:   Hematology.   Procedures: none.    Antimicrobials: none.    Subjective: Continues to have bruising. Pt reports not feeling good.  Some gum bleeding noted. Dizziness, and mild headache,  bruising over the arms and legs.   Objective: Vitals:   04/06/19 1944 04/07/19 0433 04/07/19 0442 04/07/19 1337  BP: (!) 129/96 (!) 150/102 (!) 154/98 105/79  Pulse: 87 78 73 83  Resp: 16   16  Temp: 98 F (36.7 C) 98.2 F (36.8 C)  98 F (36.7 C)  TempSrc: Oral Oral  Oral  SpO2: 94% 98%  95%  Weight:  57.5 kg    Height:  Intake/Output Summary (Last 24 hours) at 04/07/2019 1458 Last data filed at 04/07/2019 1339 Gross per 24 hour  Intake 1690 ml  Output 3900 ml  Net -2210 ml   Filed Weights   04/05/19 0500 04/06/19 0410 04/07/19 0433  Weight: 56.7 kg 57.1 kg 57.5 kg    Examination:  General exam: reports not feeling good,  Respiratory system: Clear to auscultation.  Respiratory effort normal. Cardiovascular system: S1 & S2 heard, RRR.[name] Kitchen No pedal edema. Gastrointestinal system: Abdomen is Central nervous system: Alert and oriented. UNSTEADY gait.  Extremities: Symmetric 5 x 5 power. Skin: bruising, and petechiae present.  Psychiatry: . Mood & affect appropriate.     Data Reviewed: I have personally reviewed following labs and imaging studies  CBC: Recent Labs  Lab 04/02/19 1608  04/04/19 0728 04/05/19 0420 04/06/19 0350 04/06/19 1857 04/07/19 0407  WBC 3.9*   < > 1.8* 1.7* 2.4* 1.0* 2.4*  NEUTROABS 1.6*  --  0.6*  --   --  0.7*  --   HGB 14.0   < > 12.1 12.2 11.6* 12.3 11.6*  HCT 43.5   < > 37.3 37.4 37.2 38.1 35.8*  MCV 99.5   < > 100.3* 101.1* 101.6* 103.0* 102.6*  PLT 11*   < > 12* 13* 13* 16* 17*   < > = values in this interval not displayed.   Basic Metabolic Panel: Recent Labs  Lab 04/03/19 0457 04/04/19 0442 04/05/19 0420 04/06/19 0350 04/07/19 0407  NA 138 137 136 134* 135  K 2.8* 4.0 3.5 3.3* 3.7  CL 101 106 107 110 109  CO2 24 20* 20* 19* 18*  GLUCOSE 88 92 114* 115* 92  BUN '7 6 6 6 '$ 5*  CREATININE 0.52 0.50 0.55 0.50 0.49  CALCIUM 8.7* 8.9 8.4* 8.4* 8.0*   GFR: Estimated Creatinine Clearance: 69.8 mL/min (by C-G formula based on SCr of 0.49 mg/dL). Liver Function Tests: Recent Labs  Lab 04/02/19 1608 04/03/19 0457 04/05/19 0420 04/06/19 0350 04/07/19 0407  AST 148* 95* 66* 53* 52*  ALT 49* 39 '30 27 27  '$ ALKPHOS 202* 163* 157* 143* 137*  BILITOT 4.8* 3.9* 2.1* 1.6* 1.1  PROT 8.9* 7.5 7.0 8.1 8.5*  ALBUMIN 3.7 3.2* 2.8* 2.7* 2.6*   Recent Labs  Lab 04/02/19 1608  LIPASE 62*   No results for input(s): AMMONIA in the last 168 hours. Coagulation Profile: Recent Labs  Lab 04/02/19 1608  INR 1.3*   Cardiac Enzymes: No results for input(s): CKTOTAL, CKMB, CKMBINDEX, TROPONINI in the last 168 hours. BNP (last 3 results) No results for input(s): PROBNP in the last 8760 hours. HbA1C: No results for  input(s): HGBA1C in the last 72 hours. CBG: No results for input(s): GLUCAP in the last 168 hours. Lipid Profile: No results for input(s): CHOL, HDL, LDLCALC, TRIG, CHOLHDL, LDLDIRECT in the last 72 hours. Thyroid Function Tests: No results for input(s): TSH, T4TOTAL, FREET4, T3FREE, THYROIDAB in the last 72 hours. Anemia Panel: No results for input(s): VITAMINB12, FOLATE, FERRITIN, TIBC, IRON, RETICCTPCT in the last 72 hours. Sepsis Labs: No results for input(s): PROCALCITON, LATICACIDVEN in the last 168 hours.  Recent Results (from the past 240 hour(s))  SARS Coronavirus 2 (CEPHEID - Performed in [place] hospital lab), Hosp Order     Status: None   Collection Time: 04/02/19  5:52 PM   Specimen: Urine, Clean Catch; Nasopharyngeal  Result Value Ref Range Status   SARS Coronavirus 2 NEGATIVE NEGATIVE Final    Comment: (NOTE) If  result is NEGATIVE SARS-CoV-2 target nucleic acids are NOT DETECTED. The SARS-CoV-2 RNA is generally detectable in upper and lower  respiratory specimens during the acute phase of infection. The lowest  concentration of SARS-CoV-2 viral copies this assay can detect is 250  copies / mL. A negative result does not preclude SARS-CoV-2 infection  and should not be used as the sole basis for treatment or other  patient management decisions.  A negative result may occur with  improper specimen collection / handling, submission of specimen other  than nasopharyngeal swab, presence of viral mutation(s) within the  areas targeted by this assay, and inadequate number of viral copies  (<250 copies / mL). A negative result must be combined with clinical  observations, patient history, and epidemiological information. If result is POSITIVE SARS-CoV-2 target nucleic acids are DETECTED. The SARS-CoV-2 RNA is generally detectable in upper and lower  respiratory specimens dur ing the acute phase of infection.  Positive  results are indicative of active infection with  SARS-CoV-2.  Clinical  correlation with patient history and other diagnostic information is  necessary to determine patient infection status.  Positive results do  not rule out bacterial infection or co-infection with other viruses. If result is PRESUMPTIVE POSTIVE SARS-CoV-2 nucleic acids MAY BE PRESENT.   A presumptive positive result was obtained on the submitted specimen  and confirmed on repeat testing.  While 2019 novel coronavirus  (SARS-CoV-2) nucleic acids may be present in the submitted sample  additional confirmatory testing may be necessary for epidemiological  and / or clinical management purposes  to differentiate between  SARS-CoV-2 and other Sarbecovirus currently known to infect humans.  If clinically indicated additional testing with an alternate test  methodology [id number]) is advised. The SARS-CoV-2 RNA is generally  detectable in upper and lower respiratory sp ecimens during the acute  phase of infection. The expected result is Negative. Fact Sheet for Patients:  StrictlyIdeas.no Fact Sheet for Healthcare Providers: BankingDealers.co.za This test is not yet approved or cleared by the [place] FDA and has been authorized for detection and/or diagnosis of SARS-CoV-2 by FDA under an Emergency Use Authorization (EUA).  This EUA will remain in effect (meaning this test can be used) for the duration of the COVID-19 declaration under Section 564(b)(1) of the Act, 21 U.S.C. section 360bbb-3(b)(1), unless the authorization is terminated or revoked sooner. Performed at [facility], [place] [address], [place], [place] [address]          Radiology Studies: Mr Brain Wo Contrast  Result Date: 04/07/2019 CLINICAL DATA:  Ataxia.  Chronic alcohol abuse. EXAM: MRI HEAD WITHOUT CONTRAST TECHNIQUE: Multiplanar, multiecho pulse sequences of the brain and surrounding structures were obtained without intravenous  contrast. COMPARISON:  Head CT 04/02/2019.  MRI 12/06/2018 FINDINGS: Brain: Brainstem is normal. Mild cerebellar atrophy without focal insult. Cerebral hemispheres show mild generalized atrophy without focal small or large vessel infarction. As seen previously, mild T1 hyperintensity of the basal ganglia probably relates to chronic alcohol use. No sign of mass lesion, hemorrhage, hydrocephalus or extra-axial collection. Vascular: Major vessels at the base of the brain show flow. Skull and upper cervical spine: Negative Sinuses/Orbits: Clear/normal Other: None IMPRESSION: No change since the previous study. No acute or reversible finding. Brain atrophy, premature for age, probably secondary to chronic alcohol use. Mild T1 hyperintensity of the basal ganglia likely related to chronic alcohol use as seen previously. Electronically Signed   By: Nelson Chimes M.D.   On: 04/07/2019 13:42  Scheduled Meds: . FLUoxetine  40 mg Oral Daily  . folic acid  1 mg Oral Daily  . gabapentin  600 mg Oral BID  . lactulose  20 g Oral TID  . multivitamin with minerals  1 tablet Oral Daily  . pantoprazole  40 mg Oral BID  . phenytoin  300 mg Oral Daily  . sodium chloride flush  3 mL Intravenous Q12H  . thiamine  100 mg Oral Daily   Or  . thiamine  100 mg Intravenous Daily  . topiramate  50 mg Oral Daily  . traZODone  100 mg Oral QHS   Continuous Infusions: . sodium chloride    . sodium chloride    . sodium chloride 125 mL/hr at 04/07/19 1059     LOS: 4 days    Time spent: 32 minutes.     Hosie Poisson, MD [facility] Pager [phone]   If 7PM-7AM, please contact night-coverage [email or web address] Password [facility] 04/07/2019, 2:58 PM

## 2019-04-07 NOTE — Progress Notes (Signed)
Ms. Wilhelmi is looking a little bit better.  She is more awake this morning.  She seems to be alert.  She has had the 2 days of IVIG.  She has had Nplate.  Her platelet count today is only 17,000.  I am still awaiting the Hepatitis C result.  If she is positive for Hepatitis C, that she probably needs to be treated for this.  This may help her thrombocytopenia.  I still think that alcohol toxicity is probably the biggest factor for the thrombocytopenia and leukopenia.  Her white cell count is a little bit better today.  She has had no bleeding.  She has had no vomiting.  Her appetite is not that good.  Her bilirubin has come down to 1.1.  Her liver function studies have also improved nicely.  Her bruises may be a little bit better.  Her vital signs look pretty stable.  Her temperature is 98.2.  Pulse 73.  Blood pressure 154/98.  Her head neck exam shows no scleral icterus.  Her lungs are clear.  Cardiac exam regular rate and rhythm with no murmurs, rubs or bruits.  Abdomen is soft.  There is no fluid wave.  There is no obvious splenomegaly.  Liver edge might be palpable at the right costal margin.  Skin exam does show the scattered ecchymoses but they seem to be improving.  Neurological exam is non-focal.  She may have a little bit of a tremor.  I still feel that the thrombocytopenia is from alcohol toxicity to the bone marrow.  She has obvious cirrhosis.  We will see if the platelets improve any further.  Again, the fact that she is not drinking is certainly helpful.  I am sure that if she is discharged, she will go right back to alcohol intake.  Again, it would be nice to see the Hepatitis C studies.   Lattie Haw, MD  2 Corinthians 12:9

## 2019-04-07 NOTE — Progress Notes (Addendum)
Physical Therapy Treatment Patient Details Name: Briana Sweeney MRN: [id number] DOB: 07/25/1971 Today's Date: 04/07/2019    History of Present Illness 48 y.o. female,   polysubstance abuse, chronic alcoholism, hepatitis C, liver cirrhosis, chronic pancytopenia, bipolar disorder with anxiety features brought to the ED on 5/13 from home after a fall. Dx of thrombocytopenia.    PT Comments    Pt is progressing with mobility, she ambulated 200' without assistive device and had no loss of balance. Supervision for mobility recommended as pt reports h/o 10 falls in past 1 month. Pt reported new low back pain and requested pain medication, RN notified. Noted pt had bleeding while brushing her teeth.    Follow Up Recommendations  Supervision for mobility/OOB;Home health PT(pt may be able to transition to [place] if balance and mobility progresses in acute care; unsafe at this time due to decreased caregiver support)     Equipment Recommendations  None recommended by PT    Recommendations for Other Services       Precautions / Restrictions Precautions Precautions: Fall Precaution Comments: pt reports 10 falls in past month, stated she gets dizzy when her platelets are low Restrictions Weight Bearing Restrictions: No    Mobility  Bed Mobility Overal bed mobility: Independent             General bed mobility comments: independent from flat bed without use of bed rails  Transfers Overall transfer level: Needs assistance Equipment used: None Transfers: Sit to/from Stand Sit to Stand: Supervision         General transfer comment: supervision for safety 2* h/o falls, no LOB  Ambulation/Gait Ambulation/Gait assistance: Min guard;Supervision Gait Distance (Feet): 200 Feet Assistive device: None Gait Pattern/deviations: Step-through pattern;Decreased stride length;Narrow base of support Gait velocity: decr mildly   General Gait Details: no loss of balance this session   Stairs             Wheelchair Mobility    Modified Rankin (Stroke Patients Only)       Balance Overall balance assessment: Needs assistance Sitting-balance support: Feet supported;No upper extremity supported Sitting balance-Leahy Scale: Good Sitting balance - Comments: pt abel to perform toileting hygiene tasks with 1 UE support on grab bar     Standing balance-Leahy Scale: Good                 High Level Balance Comments: pt required min guard through forward gait and up to min assis toprevent LOB during dynamic gait including turns, and backward walking to negotiate obstacles in hospital room            Cognition Arousal/Alertness: Awake/alert Behavior During Therapy: [facility] for tasks assessed/performed Overall Cognitive Status: Within Functional Limits for tasks assessed                                        Exercises      General Comments        Pertinent Vitals/Pain Pain Assessment: 0-10 Pain Score: 5  Pain Location: low back Pain Descriptors / Indicators: Aching;Burning Pain Intervention(s): Limited activity within patient's tolerance;Monitored during session;Patient requesting pain meds-RN notified    Home Living                      Prior Function            PT Goals (current goals can now be found in  the care plan section) Acute Rehab PT Goals Patient Stated Goal: no specific goals stated by patient, she wants to stop falling backwards so much Time For Goal Achievement: 04/12/19 Potential to Achieve Goals: Good Progress towards PT goals: Progressing toward goals    Frequency    Min 3X/week      PT Plan Discharge plan needs to be updated    Co-evaluation              AM-PAC PT "6 Clicks" Mobility   Outcome Measure  Help needed turning from your back to your side while in a flat bed without using bedrails?: None Help needed moving from lying on your back to sitting on the side of a flat bed without  using bedrails?: None Help needed moving to and from a bed to a chair (including a wheelchair)?: A Little Help needed standing up from a chair using your arms (e.g., wheelchair or bedside chair)?: A Little Help needed to walk in hospital room?: A Little Help needed climbing 3-5 steps with a railing? : A Little 6 Click Score: 20    End of Session Equipment Utilized During Treatment: Gait belt Activity Tolerance: Patient tolerated treatment well Patient left: in bed;with call bell/phone within reach;with bed alarm set Nurse Communication: Mobility status PT Visit Diagnosis: Unsteadiness on feet (R26.81);Other abnormalities of gait and mobility (R26.89);History of falling (Z91.81);Muscle weakness (generalized) (M62.81);Difficulty in walking, not elsewhere classified (R26.2)     Time: 2023-3435 PT Time Calculation (min) (ACUTE ONLY): 20 min  Charges:  $Gait Training: 8-22 mins                    Blondell Reveal Kistler PT 04/07/2019  Acute Rehabilitation Services Pager [phone] Office [phone]

## 2019-04-07 NOTE — Progress Notes (Signed)
Pt c/o new pain to lower right and left back, "at my kidneys" per patient. Pain rated 5 out of 10, started hurting when pt was walking in hallway with PT. Pt describes pain as "aching" & "burning." No PRN meds available. MD Karleen Hampshire paged to be made aware.

## 2019-04-08 LAB — CBC WITH DIFFERENTIAL/PLATELET
Abs Immature Granulocytes: 0 10*3/uL (ref 0.00–0.07)
Basophils Absolute: 0 10*3/uL (ref 0.0–0.1)
Basophils Relative: 1 %
Eosinophils Absolute: 0 10*3/uL (ref 0.0–0.5)
Eosinophils Relative: 1 %
HCT: 39.9 % (ref 36.0–46.0)
Hemoglobin: 12.3 g/dL (ref 12.0–15.0)
Immature Granulocytes: 0 %
Lymphocytes Relative: 41 %
Lymphs Abs: 0.6 10*3/uL — ABNORMAL LOW (ref 0.7–4.0)
MCH: 32.3 pg (ref 26.0–34.0)
MCHC: 30.8 g/dL (ref 30.0–36.0)
MCV: 104.7 fL — ABNORMAL HIGH (ref 80.0–100.0)
Monocytes Absolute: 0.4 10*3/uL (ref 0.1–1.0)
Monocytes Relative: 29 %
Neutro Abs: 0.4 10*3/uL — ABNORMAL LOW (ref 1.7–7.7)
Neutrophils Relative %: 28 %
Platelets: 21 10*3/uL — CL (ref 150–400)
RBC: 3.81 MIL/uL — ABNORMAL LOW (ref 3.87–5.11)
RDW: 19.2 % — ABNORMAL HIGH (ref 11.5–15.5)
WBC: 1.4 10*3/uL — CL (ref 4.0–10.5)
nRBC: 0 % (ref 0.0–0.2)

## 2019-04-08 LAB — COMPREHENSIVE METABOLIC PANEL
ALT: 30 U/L (ref 0–44)
AST: 54 U/L — ABNORMAL HIGH (ref 15–41)
Albumin: 2.7 g/dL — ABNORMAL LOW (ref 3.5–5.0)
Alkaline Phosphatase: 149 U/L — ABNORMAL HIGH (ref 38–126)
Anion gap: 6 (ref 5–15)
BUN: 5 mg/dL — ABNORMAL LOW (ref 6–20)
CO2: 21 mmol/L — ABNORMAL LOW (ref 22–32)
Calcium: 8.2 mg/dL — ABNORMAL LOW (ref 8.9–10.3)
Chloride: 109 mmol/L (ref 98–111)
Creatinine, Ser: 0.59 mg/dL (ref 0.44–1.00)
GFR calc Af Amer: >60 mL/min (ref >60)
GFR calc non Af Amer: >60 mL/min (ref >60)
Glucose, Bld: 108 mg/dL — ABNORMAL HIGH (ref 70–99)
Potassium: 3.9 mmol/L (ref 3.5–5.1)
Sodium: 136 mmol/L (ref 135–145)
Total Bilirubin: 1.1 mg/dL (ref 0.3–1.2)
Total Protein: 8.6 g/dL — ABNORMAL HIGH (ref 6.5–8.1)

## 2019-04-08 LAB — HCV RNA QUANT
HCV Quantitative Log: 4.941 log10 IU/mL (ref >1.70)
HCV Quantitative: 87200 IU/mL (ref >50)

## 2019-04-08 MED ORDER — TRAMADOL HCL 50 MG PO TABS
50.0000 mg | ORAL_TABLET | Freq: Four times a day (QID) | ORAL | Status: DC | PRN
  Administered 2019-04-08 – 2019-04-13 (×9): 50 mg via ORAL
  Filled 2019-04-08 (×9): qty 1

## 2019-04-08 MED ORDER — TBO-FILGRASTIM 480 MCG/0.8ML ~~LOC~~ SOSY
480.0000 ug | PREFILLED_SYRINGE | Freq: Once | SUBCUTANEOUS | Status: AC
  Administered 2019-04-08: 480 ug via SUBCUTANEOUS
  Filled 2019-04-08: qty 0.8

## 2019-04-08 NOTE — Progress Notes (Signed)
PROGRESS NOTE    Briana Sweeney  PTW:[id number] DOB: 08-31-1971 DOA: 04/02/2019 PCP: [name], FNP    Brief Narrative:   Per HPI:  48 y.o.female,polysubstance abuse,chronic alcoholism, hepatitis C, liver cirrhosis, chronic pancytopenia, bipolar disorder with anxiety features brought to the ED on 5/13 from home after a fall.Patient has continued to drink alcohol despite her advanced liver disease. She was apparently at a friend's house after drinking excessively when she fell and hit her head. Patient lost some consciousness briefly. Pt c/o easy bruising, dizziness, and fall 4 days ago where she hit her head on bathtub.   In ED,VSS, CT brain- no acute abnormality. 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 labs with platelets of 11k, abnormal lfts Ast 148, Alt 49, Alk phos 202, T. Bili 4.8, INR 1.3, Urinalysis negative,Covid-19 negative Hem/onc consulted and admitted Status post on unit platelet transfusion then Nplate was given 7/00 Starting immunoglobulin and a steroid.  Remains on CIWA Ativan and Librium taper.  Assessment & Plan:   Principal Problem:   Thrombocytopenia ([place]) Active Problems:   Hypokalemia   Alcoholic cirrhosis of liver with ascites (HCC)   Alcohol abuse with alcohol-induced mood disorder (HCC)   MDD (major depressive disorder), severe (HCC)   Alcohol dependence (HCC)   Dizziness   Bruising   Hyponatremia    Severe thrombocytopenia Probably secondary to marrow toxicity from alcohol liver cirrhosis> S/p platelet transfusion but without any improvement in platelet count. Hematology on board and she underwent endplate  infusion and IV steroids and IVIG started on 04/05/2019. Platelets  Slowly rising, today at 21,000 . Watch for spontaneous bleeding.  Her bruising has improved. No gum bleeding today.    Persistent dizziness and headache,  MRI brain showed signs of alcohol abuse.  No blurry vision.  Pt has a h/o of chronic alcohol abuse and has unsteady gait.     Flank pain and back pain:  No intrabdominal bleeding seen on CT. Pt has ascites and its worsening which would explain her abd discomfort, but unable to do US paracentesis due to low platelet count. Would preferably need more than 60,000 platelets.     Hypokalemia Replaced    EtOH abuse/alcohol withdrawal Continue with CIWA    Hyponatremia Improved.    Hepatitis C: Viral load pending. New order placed yesterday   Alcoholic cirrhosis with ascites and esophageal varices on CT   will order US paracentesis once platelet count improv to greater than 60,000.    Coffee ground emesis:  - resolved.  - resume PPI BID.    Gerd: Continue with PPI.   Seizures:  Resume dilantin.       DVT prophylaxis: scd's Code Status: full code.  Family Communication: none at bedside.  Disposition Plan:pending clinical improvement and improvement in platelet count.   Consultants:   Hematology.   Procedures: none.    Antimicrobials: none.    Subjective: No gum bleeding today.  Pt reports abdominal discomfort due to ascites. Tramadol ordered.   Objective: Vitals:   04/07/19 1337 04/07/19 1954 04/08/19 0428 04/08/19 1144  BP: 105/79 (!) 115/93 (!) 130/98 121/90  Pulse: 83 79 90 78  Resp: 16 20  (!) 22  Temp: 98 F (36.7 C) 98.4 F (36.9 C)  98.9 F (37.2 C) 99 F (37.2 C)  TempSrc: Oral Oral Oral Oral  SpO2: 95% 93% 94% 96%  Weight:   59.2 kg   Height:        Intake/Output Summary (Last 24 hours) at 04/08/2019 1808 Last data filed at 04/08/2019 1412 Gross per 24 hour  Intake 240 ml  Output 1400 ml  Net -1160 ml    Filed Weights   04/06/19 0410 04/07/19 0433 04/08/19 0428  Weight: 57.1 kg 57.5 kg 59.2 kg    Examination:  General exam: alert and not in distress.  Respiratory system: Clear to auscultation. Respiratory effort normal. Cardiovascular system: S1 & S2 heard, RRR.[name] Kitchen No pedal edema. Gastrointestinal system: Abdomen is soft, distended. No tenderness.  Central nervous system: Alert and oriented. UNSTEADY gait.  Extremities: Symmetric 5 x 5 power. Skin: bruising, and petechiae present.  Psychiatry: . Mood & affect appropriate.     Data Reviewed: I have personally reviewed following labs and imaging studies  CBC: Recent Labs  Lab 04/02/19 1608  04/04/19 0728 04/05/19 0420 04/06/19 0350 04/06/19 1857 04/07/19 0407 04/08/19 0442  WBC 3.9*   < > 1.8* 1.7* 2.4* 1.0* 2.4* 1.4*  NEUTROABS 1.6*  --  0.6*  --   --  0.7*  --  0.4*  HGB 14.0   < > 12.1 12.2 11.6* 12.3 11.6* 12.3  HCT 43.5   < > 37.3 37.4 37.2 38.1 35.8* 39.9  MCV 99.5   < > 100.3* 101.1* 101.6* 103.0* 102.6* 104.7*  PLT 11*   < > 12* 13* 13* 16* 17* 21*   < > = values in this interval not displayed.   Basic Metabolic Panel: Recent Labs  Lab 04/04/19 0442 04/05/19 0420 04/06/19 0350 04/07/19 0407 04/08/19 0442  NA 137 136 134* 135 136  K 4.0 3.5 3.3* 3.7 3.9  CL 106 107 110 109 109  CO2 20* 20* 19* 18* 21*  GLUCOSE 92 114* 115* 92 108*  BUN _0 5* 5*  CREATININE 0.50 0.55 0.50 0.49 0.59  CALCIUM 8.9 8.4* 8.4* 8.0* 8.2*   GFR: Estimated Creatinine Clearance: 70.7 mL/min (by C-G formula based on SCr of 0.59 mg/dL). Liver Function Tests: Recent Labs  Lab 04/03/19 0457 04/05/19 0420 04/06/19 0350 04/07/19 0407 04/08/19 0442  AST 95* 66* 53* 52* 54*  ALT 39 _1 ALKPHOS 163* 157* 143* 137* 149*  BILITOT 3.9* 2.1* 1.6* 1.1 1.1  PROT 7.5 7.0 8.1 8.5* 8.6*  ALBUMIN 3.2* 2.8* 2.7* 2.6* 2.7*   Recent Labs  Lab 04/02/19 1608  LIPASE 62*   No results for input(s): AMMONIA in the last 168  hours. Coagulation Profile: Recent Labs  Lab 04/02/19 1608  INR 1.3*   Cardiac Enzymes: No results for input(s): CKTOTAL, CKMB, CKMBINDEX, TROPONINI in the last 168 hours. BNP (last 3 results) No results for input(s): PROBNP in the last 8760 hours. HbA1C: No results for input(s): HGBA1C in the last 72 hours. CBG: No results for input(s): GLUCAP in the last 168 hours. Lipid Profile: No results for input(s): CHOL, HDL, LDLCALC, TRIG, CHOLHDL, LDLDIRECT in the last 72 hours. Thyroid Function Tests: No results for input(s): TSH, T4TOTAL, FREET4, T3FREE, THYROIDAB in the last 72 hours. Anemia Panel: No results for input(s): VITAMINB12, FOLATE, FERRITIN, TIBC, IRON, RETICCTPCT in the last 72 hours. Sepsis Labs: No results for input(s): PROCALCITON, LATICACIDVEN in the last 168 hours.  Recent Results (from the past 240 hour(s))  SARS Coronavirus 2 (CEPHEID -  Performed in [facility] hospital lab), Hosp Order     Status: None   Collection Time: 04/02/19  5:52 PM   Specimen: Urine, Clean Catch; Nasopharyngeal  Result Value Ref Range Status   SARS Coronavirus 2 NEGATIVE NEGATIVE Final    Comment: (NOTE) If result is NEGATIVE SARS-CoV-2 target nucleic acids are NOT DETECTED. The SARS-CoV-2 RNA is generally detectable in upper and lower  respiratory specimens during the acute phase of infection. The lowest  concentration of SARS-CoV-2 viral copies this assay can detect is 250  copies / mL. A negative result does not preclude SARS-CoV-2 infection  and should not be used as the sole basis for treatment or other  patient management decisions.  A negative result may occur with  improper specimen collection / handling, submission of specimen other  than nasopharyngeal swab, presence of viral mutation(s) within the  areas targeted by this assay, and inadequate number of viral copies  (<250 copies / mL). A negative result must be combined with clinical  observations, patient history, and  epidemiological information. If result is POSITIVE SARS-CoV-2 target nucleic acids are DETECTED. The SARS-CoV-2 RNA is generally detectable in upper and lower  respiratory specimens dur ing the acute phase of infection.  Positive  results are indicative of active infection with SARS-CoV-2.  Clinical  correlation with patient history and other diagnostic information is  necessary to determine patient infection status.  Positive results do  not rule out bacterial infection or co-infection with other viruses. If result is PRESUMPTIVE POSTIVE SARS-CoV-2 nucleic acids MAY BE PRESENT.   A presumptive positive result was obtained on the submitted specimen  and confirmed on repeat testing.  While 2019 novel coronavirus  (SARS-CoV-2) nucleic acids may be present in the submitted sample  additional confirmatory testing may be necessary for epidemiological  and / or clinical management purposes  to differentiate between  SARS-CoV-2 and other Sarbecovirus currently known to infect humans.  If clinically indicated additional testing with an alternate test  methodology [phone]) is advised. The SARS-CoV-2 RNA is generally  detectable in upper and lower respiratory sp ecimens during the acute  phase of infection. The expected result is Negative. Fact Sheet for Patients:  StrictlyIdeas.no Fact Sheet for Healthcare Providers: BankingDealers.co.za This test is not yet approved or cleared by the [place] FDA and has been authorized for detection and/or diagnosis of SARS-CoV-2 by FDA under an Emergency Use Authorization (EUA).  This EUA will remain in effect (meaning this test can be used) for the duration of the COVID-19 declaration under Section 564(b)(1) of the Act, 21 U.S.C. section 360bbb-3(b)(1), unless the authorization is terminated or revoked sooner. Performed at [facility], [place] [address], [place], [place] [address]           Radiology Studies: Ct Abdomen Pelvis Wo Contrast  Result Date: 04/07/2019 CLINICAL DATA:  Severe thrombocytopenia.  Evaluate for bleeding. EXAM: CT ABDOMEN AND PELVIS WITHOUT CONTRAST TECHNIQUE: Multidetector CT imaging of the abdomen and pelvis was performed following the standard protocol without IV contrast. COMPARISON:  04/02/2019 FINDINGS: Lower chest: Dependent atelectasis noted left lower lobe. Calcified granuloma identified in the left lower lobe. Hepatobiliary: Low attenuation of liver parenchyma is compatible with fatty deposition. There is hypertrophy of the lateral segment left liver with diffuse nodularity of liver contour, features consistent with cirrhosis. 10 x 7 mm calcified gallstone evident with mild prominence of the gallbladder wall. Insert no biliary Pancreas: No evidence for main duct dilatation. Spleen: Spleen is 13.3  cm craniocaudal length, upper normal. Adrenals/Urinary Tract: No adrenal nodule or mass. Kidneys unremarkable on noncontrast imaging. No evidence for hydroureter. The urinary bladder appears normal for the degree of distention. Stomach/Bowel: Stomach is distended with food. Duodenum is normally positioned as is the ligament of Treitz. No small bowel wall thickening. No small bowel dilatation. The terminal ileum is normal. The appendix is not visualized, but there is no edema or inflammation in the region of the cecum. No gross colonic mass. No colonic wall thickening. Vascular/Lymphatic: There is abdominal aortic atherosclerosis without aneurysm. There is no gastrohepatic or hepatoduodenal ligament lymphadenopathy. No intraperitoneal or retroperitoneal lymphadenopathy. No pelvic sidewall lymphadenopathy. Reproductive: The uterus is unremarkable.  There is no adnexal mass. Other: Trace fluid in the region of the left paracolic gutter. Trace fluid evident in the cul-de-sac. Musculoskeletal: No worrisome lytic or sclerotic osseous abnormality. IMPRESSION: 1. No acute  findings in the abdomen/pelvis. Specifically, no evidence for retroperitoneal hemorrhage or gross hemoperitoneum. 2. Cirrhotic liver morphology. 3. Cholelithiasis. Electronically Signed   By: Misty Stanley M.D.   On: 04/07/2019 19:31   Mr Brain Wo Contrast  Result Date: 04/07/2019 CLINICAL DATA:  Ataxia.  Chronic alcohol abuse. EXAM: MRI HEAD WITHOUT CONTRAST TECHNIQUE: Multiplanar, multiecho pulse sequences of the brain and surrounding structures were obtained without intravenous contrast. COMPARISON:  Head CT 04/02/2019.  MRI 12/06/2018 FINDINGS: Brain: Brainstem is normal. Mild cerebellar atrophy without focal insult. Cerebral hemispheres show mild generalized atrophy without focal small or large vessel infarction. As seen previously, mild T1 hyperintensity of the basal ganglia probably relates to chronic alcohol use. No sign of mass lesion, hemorrhage, hydrocephalus or extra-axial collection. Vascular: Major vessels at the base of the brain show flow. Skull and upper cervical spine: Negative Sinuses/Orbits: Clear/normal Other: None IMPRESSION: No change since the previous study. No acute or reversible finding. Brain atrophy, premature for age, probably secondary to chronic alcohol use. Mild T1 hyperintensity of the basal ganglia likely related to chronic alcohol use as seen previously. Electronically Signed   By: Nelson Chimes M.D.   On: 04/07/2019 13:42        Scheduled Meds:  FLUoxetine  40 mg Oral Daily   folic acid  1 mg Oral Daily   gabapentin  600 mg Oral BID   lactulose  20 g Oral TID   multivitamin with minerals  1 tablet Oral Daily   pantoprazole  40 mg Oral BID   phenytoin  300 mg Oral Daily   sodium chloride flush  3 mL Intravenous Q12H   thiamine  100 mg Oral Daily   Or   thiamine  100 mg Intravenous Daily   topiramate  50 mg Oral Daily   traZODone  100 mg Oral QHS   Continuous Infusions:  sodium chloride     sodium chloride     sodium chloride 75 mL/hr at  04/08/19 1127     LOS: 5 days    Time spent: 32 minutes.     Hosie Poisson, MD [facility] Pager [phone]   If 7PM-7AM, please contact night-coverage [email or web address] Password [email or web address] 04/08/2019, 6:08 PM

## 2019-04-08 NOTE — Progress Notes (Signed)
CRITICAL VALUE ALERT  Critical Value:  WBC 1.4  Date & Time Notied:  04/08/19 @ 5176  Provider Notified: Lamar Blinks NP  Orders Received/Actions taken: No new orders

## 2019-04-08 NOTE — Progress Notes (Signed)
Briana Sweeney is about the same.  She is doing some physical therapy.  It sounds like she did pretty well yesterday.  Her white cell count today is 1.4.  Her platelet count is still trending upward at 21,000.  I will give her a dose of Neupogen today.  I am sure that the leukopenia is multifactorial.  She has had no fever.  There is been no obvious bleeding.  Her bruises seem to be getting a little bit better.  Her lab work today shows her liver function studies to be stable.  Her alk phos is up a little bit at 149.  Creatinine is 0.59.  She did have a CT of the abdomen pelvis yesterday for abdominal pain.  She has a gallstone.  She has cirrhotic liver.  Everything else looks fine.  There is no bleeding.  Her physical exam is pretty much unchanged.  Her vital signs show temperature of 98.9.  Pulse 90.  Blood pressure 130/98.  Her head and neck exam shows no scleral icterus.  There is no adenopathy in the neck.  Lungs are clear.  Cardiac exam regular rate and rhythm.  Abdomen is soft.  There is some diffuse tenderness to palpation.  There is no fluid wave.  There is no obvious liver or spleen tip.  Extremities shows no clubbing, cyanosis or edema.  Skin exam shows some scattered ecchymoses.  These appear to be a little bit better.  Neurological exam is nonfocal.  I think the real issue now is the discharge planning.  I suspect that if she were to go home, she will just go right back to drinking.  This will further poison her bone marrow and allow her blood counts to drop.  This would certainly put her at risk for bleeding.  It would be nice to have the Hepatitis C levels.  If she has Hepatitis C, she probably needs to be treated for this.  This certainly could help with her blood counts.  I know that she is getting wonderful care from all the staff on [address].  I know everybody is trying to work their best to make her life a little bit better.  Briana Haw, MD  Psalm 150:6

## 2019-04-09 LAB — CBC WITH DIFFERENTIAL/PLATELET
Abs Immature Granulocytes: 0.02 10*3/uL (ref 0.00–0.07)
Basophils Absolute: 0.1 10*3/uL (ref 0.0–0.1)
Basophils Relative: 1 %
Eosinophils Absolute: 0.1 10*3/uL (ref 0.0–0.5)
Eosinophils Relative: 1 %
HCT: 41.4 % (ref 36.0–46.0)
Hemoglobin: 13.1 g/dL (ref 12.0–15.0)
Immature Granulocytes: 0 %
Lymphocytes Relative: 18 %
Lymphs Abs: 0.9 10*3/uL (ref 0.7–4.0)
MCH: 32.2 pg (ref 26.0–34.0)
MCHC: 31.6 g/dL (ref 30.0–36.0)
MCV: 101.7 fL — ABNORMAL HIGH (ref 80.0–100.0)
Monocytes Absolute: 1.1 10*3/uL — ABNORMAL HIGH (ref 0.1–1.0)
Monocytes Relative: 23 %
Neutro Abs: 2.7 10*3/uL (ref 1.7–7.7)
Neutrophils Relative %: 57 %
Platelets: 27 10*3/uL — CL (ref 150–400)
RBC: 4.07 MIL/uL (ref 3.87–5.11)
RDW: 19.1 % — ABNORMAL HIGH (ref 11.5–15.5)
WBC: 4.9 10*3/uL (ref 4.0–10.5)
nRBC: 0 % (ref 0.0–0.2)

## 2019-04-09 LAB — HEPATITIS C VRS RNA DETECT BY PCR-QUAL: Hepatitis C Vrs RNA by PCR-Qual: POSITIVE — AB

## 2019-04-09 LAB — POTASSIUM: Potassium: 4.1 mmol/L (ref 3.5–5.1)

## 2019-04-09 MED ORDER — FUROSEMIDE 40 MG PO TABS
40.0000 mg | ORAL_TABLET | Freq: Every day | ORAL | Status: DC
  Administered 2019-04-09 – 2019-04-12 (×4): 40 mg via ORAL
  Filled 2019-04-09 (×4): qty 1

## 2019-04-09 NOTE — Progress Notes (Signed)
PROGRESS NOTE    Briana Sweeney  ZOX:[id number] DOB: [birth year] DOA: 04/02/2019 PCP: [name], FNP    Brief Narrative:   Per HPI:  48 y.o.female,polysubstance abuse,chronic alcoholism, hepatitis C, liver cirrhosis, chronic pancytopenia, bipolar disorder with anxiety features brought to the ED on 5/13 from home after a fall.Patient has continued to drink alcohol despite her advanced liver disease. She was apparently at a friend's house after drinking excessively when she fell and hit her head. Patient lost some consciousness briefly. Pt c/o easy bruising, dizziness, and fall 4 days ago where she hit her head on bathtub.   In ED,VSS, CT brain- no acute abnormality. 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 labs with platelets of 11k, abnormal lfts Ast 148, Alt 49, Alk phos 202, T. Bili 4.8, INR 1.3, Urinalysis negative,Covid-19 negative Hem/onc consulted and admitted Status post on unit platelet transfusion then Nplate was given 8/11 Starting immunoglobulin and a steroid.  Remains on CIWA Ativan and Librium taper. 7/31 abdominal distention possibly ascites but her platelet count too low for US paracentesis.   Assessment & Plan:   Principal Problem:   Thrombocytopenia ([place]) Active Problems:   Hypokalemia   Alcoholic cirrhosis of liver with ascites (HCC)   Alcohol abuse with alcohol-induced mood disorder (HCC)   MDD (major depressive disorder), severe (HCC)   Alcohol dependence (HCC)   Dizziness   Bruising   Hyponatremia    Severe thrombocytopenia Probably secondary to marrow toxicity from alcohol liver cirrhosis> S/p platelet transfusion but  without any improvement in platelet count. Hematology on board and she underwent endplate infusion and IV steroids and IVIG started on 04/05/2019. Platelets  Slowly rising, today at 27,000 . Watch for spontaneous bleeding.  Her bruising has improved. No gum bleeding today.    Persistent dizziness and headache,  MRI brain showed signs of alcohol abuse.  No blurry vision.  Pt has a h/o of chronic alcohol abuse and has unsteady gait.     Flank pain and back pain:  No intrabdominal bleeding seen on CT. Pt has ascites and its worsening which would explain her abd discomfort, but unable to do US paracentesis due to low platelet count. Would preferably need more than 50,000 platelets. Discussed with radiology PA , who suggests they will do paracentesis if platelet count is greater than 50,000.  D/c fluids and give her a dose of lasix to help with diuresis.     Hypokalemia Replaced    EtOH abuse/alcohol withdrawal Continue with CIWA. No signs of withdrawal.     Hyponatremia Improved.    Hepatitis C: Viral load reviewed and discussed with the patient. She will need treatment . Will need referral .    Alcoholic cirrhosis with ascites and esophageal varices on CT   will order US paracentesis once platelet count improv to greater than 50,000.  Stop IV fluids and one dose of lasix ordered.    Coffee ground emesis:  - resolved.  - resume PPI BID.    Gerd: Continue with PPI.   Seizures:  Resume dilantin.       DVT prophylaxis: scd's Code Status: full code.  Family Communication: none at bedside.  Disposition Plan:pending clinical improvement and improvement in platelet count.   Consultants:  Hematology.   Procedures: none.    Antimicrobials: none.    Subjective: Persistent abd discomfort . Will ordered some IV morphine . And lasix.   Objective: Vitals:   04/08/19 1144 04/08/19 2118 04/09/19 0719 04/09/19 0721  BP: 121/90 (!) 129/96 116/88   Pulse: 78  86 85   Resp: (!) '22 18 18   '$ Temp: 99 F (37.2 C) 98.1 F (36.7 C) 98.6 F (37 C)   TempSrc: Oral Oral Oral   SpO2: 96% 96% 97%   Weight:    53.2 kg  Height:        Intake/Output Summary (Last 24 hours) at 04/09/2019 1007 Last data filed at 04/09/2019 0724 Gross per 24 hour  Intake 2248.92 ml  Output 3000 ml  Net -751.08 ml   Filed Weights   04/07/19 0433 04/08/19 0428 04/09/19 0721  Weight: 57.5 kg 59.2 kg 53.2 kg    Examination:  General exam: alert and not in distress.  Respiratory system: Clear to auscultation. Respiratory effort normal. Cardiovascular system: S1 & S2 heard, RRR.[name] Kitchen No pedal edema. Gastrointestinal system: Abdomen is soft, distended. Some discomfort on the lateral aspects.  Central nervous system: Alert and oriented.unsteady gait.  Extremities: Symmetric 5 x 5 power. Skin: bruising, and petechiae present.  Psychiatry: . Mood & affect appropriate.     Data Reviewed: I have personally reviewed following labs and imaging studies  CBC: Recent Labs  Lab 04/02/19 1608  04/04/19 0728  04/06/19 0350 04/06/19 1857 04/07/19 0407 04/08/19 0442 04/09/19 0356  WBC 3.9*   < > 1.8*   < > 2.4* 1.0* 2.4* 1.4* 4.9  NEUTROABS 1.6*  --  0.6*  --   --  0.7*  --  0.4* 2.7  HGB 14.0   < > 12.1   < > 11.6* 12.3 11.6* 12.3 13.1  HCT 43.5   < > 37.3   < > 37.2 38.1 35.8* 39.9 41.4  MCV 99.5   < > 100.3*   < > 101.6* 103.0* 102.6* 104.7* 101.7*  PLT 11*   < > 12*   < > 13* 16* 17* 21* 27*   < > = values in this interval not displayed.   Basic Metabolic Panel: Recent Labs  Lab 04/04/19 0442 04/05/19 0420 04/06/19 0350 04/07/19 0407 04/08/19 0442  NA 137 136 134* 135 136  K 4.0 3.5 3.3* 3.7 3.9  CL 106 107 110 109 109  CO2 20* 20* 19* 18* 21*  GLUCOSE 92 114* 115* 92 108*  BUN '6 6 6 '$ 5* 5*  CREATININE 0.50 0.55 0.50 0.49 0.59  CALCIUM 8.9 8.4* 8.4* 8.0* 8.2*   GFR: Estimated Creatinine Clearance: 63.1 mL/min (by C-G formula based on SCr of 0.59  mg/dL). Liver Function Tests: Recent Labs  Lab 04/03/19 0457 04/05/19 0420 04/06/19 0350 04/07/19 0407 04/08/19 0442  AST 95* 66* 53* 52* 54*  ALT 39 '30 27 27 30  '$ ALKPHOS 163* 157* 143* 137* 149*  BILITOT 3.9* 2.1* 1.6* 1.1 1.1  PROT 7.5 7.0 8.1 8.5* 8.6*  ALBUMIN 3.2* 2.8* 2.7* 2.6* 2.7*   Recent Labs  Lab 04/02/19 1608  LIPASE 62*   No results for input(s): AMMONIA in the last 168 hours. Coagulation Profile: Recent Labs  Lab 04/02/19 1608  INR 1.3*   Cardiac Enzymes: No results for input(s): CKTOTAL, CKMB, CKMBINDEX, TROPONINI in the last 168 hours. BNP (last 3 results) No results for input(s): PROBNP in the last 8760 hours. HbA1C: No results for input(s): HGBA1C in  the last 72 hours. CBG: No results for input(s): GLUCAP in the last 168 hours. Lipid Profile: No results for input(s): CHOL, HDL, LDLCALC, TRIG, CHOLHDL, LDLDIRECT in the last 72 hours. Thyroid Function Tests: No results for input(s): TSH, T4TOTAL, FREET4, T3FREE, THYROIDAB in the last 72 hours. Anemia Panel: No results for input(s): VITAMINB12, FOLATE, FERRITIN, TIBC, IRON, RETICCTPCT in the last 72 hours. Sepsis Labs: No results for input(s): PROCALCITON, LATICACIDVEN in the last 168 hours.  Recent Results (from the past 240 hour(s))  SARS Coronavirus 2 (CEPHEID - Performed in [facility] hospital lab), Hosp Order     Status: None   Collection Time: 04/02/19  5:52 PM   Specimen: Urine, Clean Catch; Nasopharyngeal  Result Value Ref Range Status   SARS Coronavirus 2 NEGATIVE NEGATIVE Final    Comment: (NOTE) If result is NEGATIVE SARS-CoV-2 target nucleic acids are NOT DETECTED. The SARS-CoV-2 RNA is generally detectable in upper and lower  respiratory specimens during the acute phase of infection. The lowest  concentration of SARS-CoV-2 viral copies this assay can detect is 250  copies / mL. A negative result does not preclude SARS-CoV-2 infection  and should not be used as the sole basis for  treatment or other  patient management decisions.  A negative result may occur with  improper specimen collection / handling, submission of specimen other  than nasopharyngeal swab, presence of viral mutation(s) within the  areas targeted by this assay, and inadequate number of viral copies  (<250 copies / mL). A negative result must be combined with clinical  observations, patient history, and epidemiological information. If result is POSITIVE SARS-CoV-2 target nucleic acids are DETECTED. The SARS-CoV-2 RNA is generally detectable in upper and lower  respiratory specimens dur ing the acute phase of infection.  Positive  results are indicative of active infection with SARS-CoV-2.  Clinical  correlation with patient history and other diagnostic information is  necessary to determine patient infection status.  Positive results do  not rule out bacterial infection or co-infection with other viruses. If result is PRESUMPTIVE POSTIVE SARS-CoV-2 nucleic acids MAY BE PRESENT.   A presumptive positive result was obtained on the submitted specimen  and confirmed on repeat testing.  While 2019 novel coronavirus  (SARS-CoV-2) nucleic acids may be present in the submitted sample  additional confirmatory testing may be necessary for epidemiological  and / or clinical management purposes  to differentiate between  SARS-CoV-2 and other Sarbecovirus currently known to infect humans.  If clinically indicated additional testing with an alternate test  methodology [phone]) is advised. The SARS-CoV-2 RNA is generally  detectable in upper and lower respiratory sp ecimens during the acute  phase of infection. The expected result is Negative. Fact Sheet for Patients:  StrictlyIdeas.no Fact Sheet for Healthcare Providers: BankingDealers.co.za This test is not yet approved or cleared by the [place] FDA and has been authorized for detection and/or  diagnosis of SARS-CoV-2 by FDA under an Emergency Use Authorization (EUA).  This EUA will remain in effect (meaning this test can be used) for the duration of the COVID-19 declaration under Section 564(b)(1) of the Act, 21 U.S.C. section 360bbb-3(b)(1), unless the authorization is terminated or revoked sooner. Performed at [facility], [place] [address] [place], [place] [address]          Radiology Studies: Ct Abdomen Pelvis Wo Contrast  Result Date: 04/07/2019 CLINICAL DATA:  Severe thrombocytopenia.  Evaluate for bleeding. EXAM: CT ABDOMEN AND PELVIS WITHOUT CONTRAST TECHNIQUE: Multidetector CT  imaging of the abdomen and pelvis was performed following the standard protocol without IV contrast. COMPARISON:  04/02/2019 FINDINGS: Lower chest: Dependent atelectasis noted left lower lobe. Calcified granuloma identified in the left lower lobe. Hepatobiliary: Low attenuation of liver parenchyma is compatible with fatty deposition. There is hypertrophy of the lateral segment left liver with diffuse nodularity of liver contour, features consistent with cirrhosis. 10 x 7 mm calcified gallstone evident with mild prominence of the gallbladder wall. Insert no biliary Pancreas: No evidence for main duct dilatation. Spleen: Spleen is 13.3 cm craniocaudal length, upper normal. Adrenals/Urinary Tract: No adrenal nodule or mass. Kidneys unremarkable on noncontrast imaging. No evidence for hydroureter. The urinary bladder appears normal for the degree of distention. Stomach/Bowel: Stomach is distended with food. Duodenum is normally positioned as is the ligament of Treitz. No small bowel wall thickening. No small bowel dilatation. The terminal ileum is normal. The appendix is not visualized, but there is no edema or inflammation in the region of the cecum. No gross colonic mass. No colonic wall thickening. Vascular/Lymphatic: There is abdominal aortic atherosclerosis without aneurysm. There is no  gastrohepatic or hepatoduodenal ligament lymphadenopathy. No intraperitoneal or retroperitoneal lymphadenopathy. No pelvic sidewall lymphadenopathy. Reproductive: The uterus is unremarkable.  There is no adnexal mass. Other: Trace fluid in the region of the left paracolic gutter. Trace fluid evident in the cul-de-sac. Musculoskeletal: No worrisome lytic or sclerotic osseous abnormality. IMPRESSION: 1. No acute findings in the abdomen/pelvis. Specifically, no evidence for retroperitoneal hemorrhage or gross hemoperitoneum. 2. Cirrhotic liver morphology. 3. Cholelithiasis. Electronically Signed   By: Misty Stanley M.D.   On: 04/07/2019 19:31   Mr Brain Wo Contrast  Result Date: 04/07/2019 CLINICAL DATA:  Ataxia.  Chronic alcohol abuse. EXAM: MRI HEAD WITHOUT CONTRAST TECHNIQUE: Multiplanar, multiecho pulse sequences of the brain and surrounding structures were obtained without intravenous contrast. COMPARISON:  Head CT 04/02/2019.  MRI 12/06/2018 FINDINGS: Brain: Brainstem is normal. Mild cerebellar atrophy without focal insult. Cerebral hemispheres show mild generalized atrophy without focal small or large vessel infarction. As seen previously, mild T1 hyperintensity of the basal ganglia probably relates to chronic alcohol use. No sign of mass lesion, hemorrhage, hydrocephalus or extra-axial collection. Vascular: Major vessels at the base of the brain show flow. Skull and upper cervical spine: Negative Sinuses/Orbits: Clear/normal Other: None IMPRESSION: No change since the previous study. No acute or reversible finding. Brain atrophy, premature for age, probably secondary to chronic alcohol use. Mild T1 hyperintensity of the basal ganglia likely related to chronic alcohol use as seen previously. Electronically Signed   By: Nelson Chimes M.D.   On: 04/07/2019 13:42        Scheduled Meds:  FLUoxetine  40 mg Oral Daily   folic acid  1 mg Oral Daily   gabapentin  600 mg Oral BID   lactulose  20 g Oral  TID   multivitamin with minerals  1 tablet Oral Daily   pantoprazole  40 mg Oral BID   phenytoin  300 mg Oral Daily   sodium chloride flush  3 mL Intravenous Q12H   thiamine  100 mg Oral Daily   Or   thiamine  100 mg Intravenous Daily   topiramate  50 mg Oral Daily   traZODone  100 mg Oral QHS   Continuous Infusions:  sodium chloride     sodium chloride     sodium chloride 75 mL/hr at 04/09/19 0501     LOS: 6 days    Time spent: 73  minutes.     Hosie Poisson, MD [facility] Pager [phone]   If 7PM-7AM, please contact night-coverage [email or web address] Password [email or web address] 04/09/2019, 10:07 AM

## 2019-04-09 NOTE — Progress Notes (Signed)
As I suspected, she has active Hepatitis C.  This really needs to be treated.  This could be involved with her thrombocytopenia and leukopenia.  Her platelet count is trending upward.  Her platelet count today is 27,000.  Gave her a dose of Neupogen yesterday.  Her white cell count went from 1.4 up to 4.9.  Again this tells me that her marrow certainly has the ability to mount a response when stimulated.  There is no bleeding.  I doubt her platelet count will get above 60,000 for a paracentesis.  I am not sure why the platelet count needs to be 60,000 for a paracentesis to be done.  I am just glad that her platelet count is trending upward.  Maybe the fact that she is not drinking is helping this.  Maybe the IVIG that we gave her in addition to the Nplate.  Hopefully, she will have treatment for the Hepatitis C.  Ultimately, she still needs to have some kind of alcohol rehabilitation program.  I know that when she is discharged, she will get back to drinking again.  She probably needs to go to some counter rehab facility if possible.  I do appreciate all the great care that she is getting from all the staff of [facility].  Lattie Haw, MD  Psalm 91:1-2

## 2019-04-09 NOTE — Plan of Care (Signed)
  Problem: Education: Goal: Knowledge of General Education information will improve Description: Including pain rating scale, medication(s)/side effects and non-pharmacologic comfort measures Outcome: Completed/Met   Problem: Activity: Goal: Risk for activity intolerance will decrease Outcome: Completed/Met   Problem: Pain Managment: Goal: General experience of comfort will improve Outcome: Completed/Met   Problem: Safety: Goal: Ability to remain free from injury will improve Outcome: Completed/Met   

## 2019-04-09 NOTE — Progress Notes (Signed)
Physical Therapy Treatment Patient Details Name: Briana Sweeney MRN: [id number] DOB: Jul 04, 1971 Today's Date: 04/09/2019    History of Present Illness 48 y.o. female,   polysubstance abuse, chronic alcoholism, hepatitis C, liver cirrhosis, chronic pancytopenia, bipolar disorder with anxiety features brought to the ED on 5/13 from home after a fall. Dx of thrombocytopenia.    PT Comments    Pt reports abdominal discomfort and pressure so only agreeable to ambulate to/from bathroom today.  Pt awaiting paracentesis and reports she will likely be able to tolerate improved activity after after paracentesis.    Follow Up Recommendations  Supervision for mobility/OOB;Home health PT     Equipment Recommendations  None recommended by PT    Recommendations for Other Services       Precautions / Restrictions Precautions Precautions: Fall Precaution Comments: pt reports 10 falls in past month, stated she gets dizzy when her platelets are low    Mobility  Bed Mobility Overal bed mobility: Independent                Transfers Overall transfer level: Needs assistance Equipment used: None Transfers: Sit to/from Stand Sit to Stand: Supervision         General transfer comment: supervision for safety 2* h/o falls, no LOB  Ambulation/Gait Ambulation/Gait assistance: Min guard Gait Distance (Feet): 20 Feet Assistive device: None Gait Pattern/deviations: Step-through pattern;Decreased stride length;Narrow base of support     General Gait Details: pt ambulated to/from bathroom, steady without assistive device   Stairs             Wheelchair Mobility    Modified Rankin (Stroke Patients Only)       Balance                                            Cognition                                              Exercises      General Comments        Pertinent Vitals/Pain Pain Assessment: 0-10 Pain Score: 6  Pain Location:  abdomen Pain Descriptors / Indicators: Discomfort;Pressure Pain Intervention(s): Monitored during session;Repositioned    Home Living                      Prior Function            PT Goals (current goals can now be found in the care plan section) Progress towards PT goals: Progressing toward goals    Frequency    Min 3X/week      PT Plan Current plan remains appropriate    Co-evaluation              AM-PAC PT "6 Clicks" Mobility   Outcome Measure  Help needed turning from your back to your side while in a flat bed without using bedrails?: None Help needed moving from lying on your back to sitting on the side of a flat bed without using bedrails?: None Help needed moving to and from a bed to a chair (including a wheelchair)?: A Little Help needed standing up from a chair using your arms (e.g., wheelchair or bedside chair)?: A Little Help needed to walk in hospital room?:  A Little Help needed climbing 3-5 steps with a railing? : A Little 6 Click Score: 20    End of Session   Activity Tolerance: Patient tolerated treatment well Patient left: in bed;with call bell/phone within reach;with bed alarm set   PT Visit Diagnosis: History of falling (Z91.81);Muscle weakness (generalized) (M62.81);Difficulty in walking, not elsewhere classified (R26.2)     Time: 0951-1000 PT Time Calculation (min) (ACUTE ONLY): 9 min  Charges:  $Gait Training: 8-22 mins                     Carmelia Bake, PT, DPT Acute Rehabilitation Services Office: [phone] Pager: [phone]  Trena Platt 04/09/2019, 3:25 PM

## 2019-04-10 LAB — CBC WITH DIFFERENTIAL/PLATELET
Abs Immature Granulocytes: 0.05 10*3/uL (ref 0.00–0.07)
Basophils Absolute: 0 10*3/uL (ref 0.0–0.1)
Basophils Relative: 1 %
Eosinophils Absolute: 0.1 10*3/uL (ref 0.0–0.5)
Eosinophils Relative: 1 %
HCT: 41 % (ref 36.0–46.0)
Hemoglobin: 13 g/dL (ref 12.0–15.0)
Immature Granulocytes: 1 %
Lymphocytes Relative: 21 %
Lymphs Abs: 1 10*3/uL (ref 0.7–4.0)
MCH: 32 pg (ref 26.0–34.0)
MCHC: 31.7 g/dL (ref 30.0–36.0)
MCV: 101 fL — ABNORMAL HIGH (ref 80.0–100.0)
Monocytes Absolute: 0.6 10*3/uL (ref 0.1–1.0)
Monocytes Relative: 14 %
Neutro Abs: 2.9 10*3/uL (ref 1.7–7.7)
Neutrophils Relative %: 62 %
Platelets: 43 10*3/uL — ABNORMAL LOW (ref 150–400)
RBC: 4.06 MIL/uL (ref 3.87–5.11)
RDW: 19 % — ABNORMAL HIGH (ref 11.5–15.5)
WBC: 4.7 10*3/uL (ref 4.0–10.5)
nRBC: 0 % (ref 0.0–0.2)

## 2019-04-10 LAB — BASIC METABOLIC PANEL
Anion gap: 9 (ref 5–15)
BUN: 9 mg/dL (ref 6–20)
CO2: 25 mmol/L (ref 22–32)
Calcium: 8.8 mg/dL — ABNORMAL LOW (ref 8.9–10.3)
Chloride: 102 mmol/L (ref 98–111)
Creatinine, Ser: 0.66 mg/dL (ref 0.44–1.00)
GFR calc Af Amer: >60 mL/min (ref >60)
GFR calc non Af Amer: >60 mL/min (ref >60)
Glucose, Bld: 105 mg/dL — ABNORMAL HIGH (ref 70–99)
Potassium: 3.5 mmol/L (ref 3.5–5.1)
Sodium: 136 mmol/L (ref 135–145)

## 2019-04-10 LAB — HCV RNA QUANT RFLX ULTRA OR GENOTYP
HCV RNA Qnt(log copy/mL): 4.653 log10 IU/mL
HepC Qn: 45000 IU/mL

## 2019-04-10 LAB — HEPATITIS C GENOTYPE

## 2019-04-10 NOTE — Progress Notes (Signed)
PROGRESS NOTE    Briana Sweeney  ZCH:[id number] DOB: [birth year] DOA: 04/02/2019 PCP: [name], FNP    Brief Narrative:   Per HPI:  48 y.o.female,polysubstance abuse,chronic alcoholism, hepatitis C, liver cirrhosis, chronic pancytopenia, bipolar disorder with anxiety features brought to the ED on 5/13 from home after a fall.Patient has continued to drink alcohol despite her advanced liver disease. She was apparently at a friend's house after drinking excessively when she fell and hit her head. Patient lost some consciousness briefly. Pt c/o easy bruising, dizziness, and fall 4 days ago where she hit her head on bathtub.   In ED,VSS, CT brain- no acute abnormality. 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 labs with platelets of 11k, abnormal lfts Ast 148, Alt 49, Alk phos 202, T. Bili 4.8, INR 1.3, Urinalysis negative,Covid-19 negative Hem/onc consulted and admitted Status post on unit platelet transfusion then Nplate was given 2/87 Starting immunoglobulin and a steroid.  Remains on CIWA Ativan and Librium taper. 7/31 abdominal distention possibly ascites but her platelet count too low for US paracentesis.   Assessment & Plan:   Principal Problem:   Thrombocytopenia ([place]) Active Problems:   Hypokalemia   Alcoholic cirrhosis of liver with ascites (HCC)   Alcohol abuse with alcohol-induced mood disorder (HCC)   MDD (major depressive disorder), severe (HCC)   Alcohol dependence (HCC)   Dizziness   Bruising   Hyponatremia    Severe thrombocytopenia Probably secondary to marrow toxicity from alcohol liver cirrhosis> S/p platelet transfusion but  without any improvement in platelet count. Hematology on board and she underwent endplate infusion and IV steroids and IVIG started on 04/05/2019. Platelets  Slowly rising, today at 43,000 . Watch for spontaneous bleeding.  Her bruising has improved. No gum bleeding today.    Persistent dizziness and headache,  MRI brain showed signs of alcohol abuse.  No blurry vision.  Pt has a h/o of chronic alcohol abuse and has unsteady gait.     Flank pain and back pain:  No intrabdominal bleeding seen on CT. Pt has ascites and its worsening which would explain her abd discomfort, but unable to do US paracentesis due to low platelet count. Would preferably need more than 50,000 platelets. Discussed with radiology PA , who suggests they will do paracentesis if platelet count is greater than 50,000.  D/c fluids and give her a dose of lasix to help with diuresis.     Hypokalemia Replaced    EtOH abuse/alcohol withdrawal Continue with CIWA. No signs of withdrawal.     Hyponatremia Improved.    Hepatitis C: Viral load reviewed and discussed with the patient. She will need treatment . Will need referral .    Alcoholic cirrhosis with ascites and esophageal varices on CT   will order US paracentesis once platelet count improv to greater than 50,000.  Stop IV fluids and one dose of lasix ordered.    Coffee ground emesis:  - resolved.  - resume PPI BID.    Gerd: Continue with PPI.   Seizures:  Resume dilantin.       DVT prophylaxis: scd's Code Status: full code.  Family Communication: none at bedside.  Disposition Plan:pending clinical improvement and improvement in platelet count.   Consultants:  Hematology.   Procedures: none.    Antimicrobials: none.    Subjective: Persistent abd discomfort . Will ordered some IV morphine . And lasix.   Objective: Vitals:   04/09/19 1651 04/09/19 2051 04/10/19 0527 04/10/19 1300  BP: 108/73 116/86 99/62 107/76  Pulse: 82  95 80 90  Resp: '16 16 16 18  '$ Temp: 98.3 F (36.8 C) 98.7 F (37.1 C) 98.5 F (36.9 C) 98.2 F (36.8 C)  TempSrc: Oral Oral Oral Oral  SpO2: 94% 94% 93% 95%  Weight:      Height:        Intake/Output Summary (Last 24 hours) at 04/10/2019 2005 Last data filed at 04/10/2019 1900 Gross per 24 hour  Intake 480 ml  Output 2500 ml  Net -2020 ml   Filed Weights   04/07/19 0433 04/08/19 0428 04/09/19 0721  Weight: 57.5 kg 59.2 kg 53.2 kg    Examination:  General exam: alert and not in distress.  Respiratory system: Clear to auscultation. Respiratory effort normal. Cardiovascular system: S1 & S2 heard, RRR.[name] Kitchen No pedal edema. Gastrointestinal system: Abdomen is soft, distended. Some discomfort on the lateral aspects.  Central nervous system: Alert and oriented.unsteady gait.  Extremities: Symmetric 5 x 5 power. Skin: bruising, and petechiae present.  Psychiatry: . Mood & affect appropriate.     Data Reviewed: I have personally reviewed following labs and imaging studies  CBC: Recent Labs  Lab 04/04/19 0728  04/06/19 1857 04/07/19 0407 04/08/19 0442 04/09/19 0356 04/10/19 0508  WBC 1.8*   < > 1.0* 2.4* 1.4* 4.9 4.7  NEUTROABS 0.6*  --  0.7*  --  0.4* 2.7 2.9  HGB 12.1   < > 12.3 11.6* 12.3 13.1 13.0  HCT 37.3   < > 38.1 35.8* 39.9 41.4 41.0  MCV 100.3*   < > 103.0* 102.6* 104.7* 101.7* 101.0*  PLT 12*   < > 16* 17* 21* 27* 43*   < > = values in this interval not displayed.   Basic Metabolic Panel: Recent Labs  Lab 04/05/19 0420 04/06/19 0350 04/07/19 0407 04/08/19 0442 04/09/19 1407 04/10/19 1140  NA 136 134* 135 136  --  136  K 3.5 3.3* 3.7 3.9 4.1 3.5  CL 107 110 109 109  --  102  CO2 20* 19* 18* 21*  --  25  GLUCOSE 114* 115* 92 108*  --  105*  BUN 6 6 5* 5*  --  9  CREATININE 0.55 0.50 0.49 0.59  --  0.66  CALCIUM 8.4* 8.4* 8.0* 8.2*  --  8.8*   GFR: Estimated Creatinine Clearance: 63.1 mL/min (by C-G formula based on SCr of 0.66 mg/dL). Liver Function  Tests: Recent Labs  Lab 04/05/19 0420 04/06/19 0350 04/07/19 0407 04/08/19 0442  AST 66* 53* 52* 54*  ALT '30 27 27 30  '$ ALKPHOS 157* 143* 137* 149*  BILITOT 2.1* 1.6* 1.1 1.1  PROT 7.0 8.1 8.5* 8.6*  ALBUMIN 2.8* 2.7* 2.6* 2.7*   No results for input(s): LIPASE, AMYLASE in the last 168 hours. No results for input(s): AMMONIA in the last 168 hours. Coagulation Profile: No results for input(s): INR, PROTIME in the last 168 hours. Cardiac Enzymes: No results for input(s): CKTOTAL, CKMB, CKMBINDEX, TROPONINI in the last 168 hours. BNP (last 3 results) No results for input(s): PROBNP in the last 8760 hours. HbA1C: No results for input(s): HGBA1C in the last 72 hours. CBG: No results for input(s): GLUCAP in the last 168 hours. Lipid Profile: No  results for input(s): CHOL, HDL, LDLCALC, TRIG, CHOLHDL, LDLDIRECT in the last 72 hours. Thyroid Function Tests: No results for input(s): TSH, T4TOTAL, FREET4, T3FREE, THYROIDAB in the last 72 hours. Anemia Panel: No results for input(s): VITAMINB12, FOLATE, FERRITIN, TIBC, IRON, RETICCTPCT in the last 72 hours. Sepsis Labs: No results for input(s): PROCALCITON, LATICACIDVEN in the last 168 hours.  Recent Results (from the past 240 hour(s))  SARS Coronavirus 2 (CEPHEID - Performed in [place] hospital lab), Hosp Order     Status: None   Collection Time: 04/02/19  5:52 PM   Specimen: Urine, Clean Catch; Nasopharyngeal  Result Value Ref Range Status   SARS Coronavirus 2 NEGATIVE NEGATIVE Final    Comment: (NOTE) If result is NEGATIVE SARS-CoV-2 target nucleic acids are NOT DETECTED. The SARS-CoV-2 RNA is generally detectable in upper and lower  respiratory specimens during the acute phase of infection. The lowest  concentration of SARS-CoV-2 viral copies this assay can detect is 250  copies / mL. A negative result does not preclude SARS-CoV-2 infection  and should not be used as the sole basis for treatment or other  patient  management decisions.  A negative result may occur with  improper specimen collection / handling, submission of specimen other  than nasopharyngeal swab, presence of viral mutation(s) within the  areas targeted by this assay, and inadequate number of viral copies  (<250 copies / mL). A negative result must be combined with clinical  observations, patient history, and epidemiological information. If result is POSITIVE SARS-CoV-2 target nucleic acids are DETECTED. The SARS-CoV-2 RNA is generally detectable in upper and lower  respiratory specimens dur ing the acute phase of infection.  Positive  results are indicative of active infection with SARS-CoV-2.  Clinical  correlation with patient history and other diagnostic information is  necessary to determine patient infection status.  Positive results do  not rule out bacterial infection or co-infection with other viruses. If result is PRESUMPTIVE POSTIVE SARS-CoV-2 nucleic acids MAY BE PRESENT.   A presumptive positive result was obtained on the submitted specimen  and confirmed on repeat testing.  While 2019 novel coronavirus  (SARS-CoV-2) nucleic acids may be present in the submitted sample  additional confirmatory testing may be necessary for epidemiological  and / or clinical management purposes  to differentiate between  SARS-CoV-2 and other Sarbecovirus currently known to infect humans.  If clinically indicated additional testing with an alternate test  methodology [id number]) is advised. The SARS-CoV-2 RNA is generally  detectable in upper and lower respiratory sp ecimens during the acute  phase of infection. The expected result is Negative. Fact Sheet for Patients:  StrictlyIdeas.no Fact Sheet for Healthcare Providers: BankingDealers.co.za This test is not yet approved or cleared by the [place] FDA and has been authorized for detection and/or diagnosis of SARS-CoV-2 by FDA under  an Emergency Use Authorization (EUA).  This EUA will remain in effect (meaning this test can be used) for the duration of the COVID-19 declaration under Section 564(b)(1) of the Act, 21 U.S.C. section 360bbb-3(b)(1), unless the authorization is terminated or revoked sooner. Performed at [facility], [place] [address] [place], [place] [address]          Radiology Studies: No results found.      Scheduled Meds: . FLUoxetine  40 mg Oral Daily  . folic acid  1 mg Oral Daily  . furosemide  40 mg Oral Daily  . gabapentin  600 mg Oral BID  . lactulose  20 g Oral TID  . multivitamin with minerals  1 tablet Oral Daily  . pantoprazole  40 mg Oral BID  . phenytoin  300 mg Oral Daily  . sodium chloride flush  3 mL Intravenous Q12H  . thiamine  100 mg Oral Daily   Or  . thiamine  100 mg Intravenous Daily  . topiramate  50 mg Oral Daily  . traZODone  100 mg Oral QHS   Continuous Infusions: . sodium chloride    . sodium chloride       LOS: 7 days    Time spent: 32 minutes.     Hosie Poisson, MD [facility] Pager [phone]   If 7PM-7AM, please contact night-coverage [email or web address] Password [email or web address] 04/10/2019, 8:05 PM

## 2019-04-11 ENCOUNTER — Inpatient Hospital Stay (HOSPITAL_COMMUNITY): Payer: Medicaid Other

## 2019-04-11 LAB — CBC WITH DIFFERENTIAL/PLATELET
Abs Immature Granulocytes: 0.02 10*3/uL (ref 0.00–0.07)
Basophils Absolute: 0 10*3/uL (ref 0.0–0.1)
Basophils Relative: 1 %
Eosinophils Absolute: 0.1 10*3/uL (ref 0.0–0.5)
Eosinophils Relative: 2 %
HCT: 42.5 % (ref 36.0–46.0)
Hemoglobin: 13.6 g/dL (ref 12.0–15.0)
Immature Granulocytes: 1 %
Lymphocytes Relative: 33 %
Lymphs Abs: 1 10*3/uL (ref 0.7–4.0)
MCH: 32.2 pg (ref 26.0–34.0)
MCHC: 32 g/dL (ref 30.0–36.0)
MCV: 100.7 fL — ABNORMAL HIGH (ref 80.0–100.0)
Monocytes Absolute: 0.5 10*3/uL (ref 0.1–1.0)
Monocytes Relative: 17 %
Neutro Abs: 1.4 10*3/uL — ABNORMAL LOW (ref 1.7–7.7)
Neutrophils Relative %: 46 %
Platelets: 53 10*3/uL — ABNORMAL LOW (ref 150–400)
RBC: 4.22 MIL/uL (ref 3.87–5.11)
RDW: 18.7 % — ABNORMAL HIGH (ref 11.5–15.5)
WBC: 3 10*3/uL — ABNORMAL LOW (ref 4.0–10.5)
nRBC: 0 % (ref 0.0–0.2)

## 2019-04-11 LAB — HEPATITIS C VRS RNA DETECT BY PCR-QUAL: Hepatitis C Vrs RNA by PCR-Qual: POSITIVE — AB

## 2019-04-11 MED ORDER — LIDOCAINE HCL 1 % IJ SOLN
INTRAMUSCULAR | Status: AC
  Filled 2019-04-11: qty 10

## 2019-04-11 NOTE — TOC Initial Note (Signed)
Transition of Care [facility]) - Initial/Assessment Note    Patient Details  Name: Briana Sweeney MRN: [id number] Date of Birth: October 12, 1970  Transition of Care [facility]) CM/SW Contact:    Elliot Gurney [place], [place] Phone Number: 04/11/2019, 12:34 PM  Clinical Narrative:                  Patient is a54 y.o.female, brought to the ED on  from home after a fall.Patient requesting affordable housing resources. Per patient, she is currently residing with a friend and his mother where she is expected to provide care for the mother. Per patient, she is not able to follow through with this arrangement. Patient states that she is looking for places, however has not been able to find anything. This Education officer, museum provided patient with a list of affordable housing resources in [place] and [employer] that she can contact as possible options. Encouragement provided to call for posible openings or to be placed on the waiting list.  Patient encouraged to call this social worker with any additional community resource needs.   Expected Discharge Plan: [place] Barriers to Discharge: No Barriers Identified   Patient Goals and CMS Choice Patient states their goals for this hospitalization and ongoing recovery are:: patient would like to be in an affordable place where she can fully recover      Expected Discharge Plan and Services Expected Discharge Plan: [place] In-house Referral: Clinical Social Work     Living arrangements for the past 2 months: Single Family Home                                      Prior Living Arrangements/Services Living arrangements for the past 2 months: Single Family Home Lives with:: Friends Patient language and need for interpreter reviewed:: No Do you feel safe going back to the place where you live?: Yes      Need for Family Participation in Patient Care: No (Comment) Care giver support system in place?: Yes (comment)    Criminal Activity/Legal Involvement Pertinent to Current Situation/Hospitalization: No - Comment as needed  Activities of Daily Living Home Assistive Devices/Equipment: None ADL Screening (condition at time of admission) Patient's cognitive ability adequate to safely complete daily activities?: Yes Is the patient deaf or have difficulty hearing?: No Does the patient have difficulty seeing, even when wearing glasses/contacts?: No Does the patient have difficulty concentrating, remembering, or making decisions?: No Patient able to express need for assistance with ADLs?: Yes Does the patient have difficulty dressing or bathing?: No Independently performs ADLs?: Yes (appropriate for developmental age) Does the patient have difficulty walking or climbing stairs?: Yes Weakness of Legs: Both Weakness of Arms/Hands: Both  Permission Sought/Granted                  Emotional Assessment Appearance:: Appears stated age Attitude/Demeanor/Rapport: Gracious Affect (typically observed): Accepting Orientation: : Oriented to Self, Oriented to Place, Oriented to  Time, Oriented to Situation Alcohol / Substance Use: Alcohol Use Psych Involvement: No (comment)  Admission diagnosis:  Thrombocytopenia [facility]) [D69.6] Patient Active Problem List   Diagnosis Date Noted  . Dizziness 04/02/2019  . Bruising 04/02/2019  . Hyponatremia 04/02/2019  . Head injury   . Scalp laceration, initial encounter   . Fall 01/20/2019  . Epistaxis 01/20/2019  . Altered mental status   . 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  Anxiety 10/27/2018  . Neuropathy 10/27/2018  . Abscess of bursa of left elbow 09/23/2018  . Olecranon bursitis of left elbow   . Erysipelas 09/13/2018  . Acute metabolic encephalopathy 26/71/2458  . Overdose 08/22/2018  . Hypotension 08/22/2018  . Seizure ([place]) 08/22/2018  . Substance induced mood disorder ([place]) 07/17/2018  . Cocaine dependence without complication ([place])  09/98/3382  . Polysubstance abuse ([place]) 04/08/2018  . 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 Hepatitis C 03/18/2018  . Portal hypertension ([place]) 03/18/2018  . Alcoholic intoxication without complication ([place]) 50/53/9767  . Depression 03/18/2018  . Upper GI bleed 02/10/2018  . Alcohol withdrawal, with unspecified complication ([place]) 34/19/3790  . Chronic anemia   . Thrombocytopenia due to drugs   . Suicidal ideation   . Thrombocytopenia ([place]) 01/02/2018  . GERD (gastroesophageal reflux disease) 11/01/2017  . Trichimoniasis 10/30/2017  . Alcohol dependence ([facility]) 10/29/2017  . MDD (major depressive disorder), severe ([place]) 10/28/2017  . Acute hyperactive alcohol withdrawal delirium ([place]) 10/09/2017  . Schizophrenia ([place]) 10/09/2017  . Alcohol abuse with alcohol-induced mood disorder ([place])   . Alcohol withdrawal syndrome with complication ([place]) 24/05/7352  . Esophageal varices without bleeding ([place])   . Hematemesis 09/01/2017  . Ascites due to alcoholic cirrhosis ([place])   . Decompensated hepatic cirrhosis ([place]) 07/23/2017  . Alcohol abuse 07/23/2017  . Hepatitis C antibody positive in blood 07/23/2017  . Hypokalemia 07/23/2017  . Jaundice 07/23/2017  . Coagulopathy ([place]) 07/23/2017  . Hypomagnesemia 07/23/2017  . Pancytopenia ([place]) 07/23/2017  . Alcoholic cirrhosis of liver with ascites ([place]) 07/23/2017  . Bacterial vaginosis 06/03/2017  . UTI (urinary tract infection) 06/02/2017   PCP:  [name], FNP Pharmacy:   [facility], [address] [place] [address] Phone: [phone] Fax: [id number] - [place], [place] [address] Phone: [phone] Fax: [phone]  [facility], [address] [place] [address] Phone: [phone] Fax:  [phone]  [facility] [phone] - [place], [place] - [address] [employer] [place] [address] Phone: [phone] Fax: [phone]     Social Determinants of Health (SDOH) Interventions    Readmission Risk Interventions No flowsheet data found.    Elliot Gurney, [place] [facility] [phone]

## 2019-04-11 NOTE — Progress Notes (Signed)
PROGRESS NOTE    Briana Sweeney  TZG:[id number] DOB: Jan 20, 1971 DOA: 04/02/2019 PCP: [name], FNP    Brief Narrative:   Per HPI:  48 y.o.female,polysubstance abuse,chronic alcoholism, hepatitis C, liver cirrhosis, chronic pancytopenia, bipolar disorder with anxiety features brought to the ED on 5/13 from home after a fall.Patient has continued to drink alcohol despite her advanced liver disease. She was apparently at a friend's house after drinking excessively when she fell and hit her head. Patient lost some consciousness briefly. Pt c/o easy bruising, dizziness, and fall 4 days ago where she hit her head on bathtub.   In ED,VSS, CT brain- no acute abnormality. 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 labs with platelets of 11k, abnormal lfts Ast 148, Alt 49, Alk phos 202, T. Bili 4.8, INR 1.3, Urinalysis negative,Covid-19 negative Hem/onc consulted and admitted Status post on unit platelet transfusion then Nplate was given 7/59 Starting immunoglobulin and a steroid.  Remains on CIWA Ativan and Librium taper. 7/31 abdominal distention possibly ascites but her platelet count too low for US paracentesis.   Assessment & Plan:   Principal Problem:   Thrombocytopenia ([place]) Active Problems:   Hypokalemia   Alcoholic cirrhosis of liver with ascites (HCC)   Alcohol abuse with alcohol-induced mood disorder (HCC)   MDD (major depressive disorder), severe (HCC)   Alcohol dependence (HCC)   Dizziness   Bruising   Hyponatremia    Severe thrombocytopenia Probably secondary to marrow toxicity from alcohol liver cirrhosis. S/p platelet transfusion but  without any improvement in platelet count. Hematology on board and she underwent endplate infusion and IV steroids and IVIG started on 04/05/2019. Platelets  Slowly rising, today at 53,000 . Watch for spontaneous bleeding.  Her bruising has improved. No gum bleeding today.    Persistent dizziness and headache,  MRI brain showed signs of alcohol abuse.  No blurry vision.  Pt has a h/o of chronic alcohol abuse and has unsteady gait.     Flank pain and back pain:  No intrabdominal bleeding seen on CT. [place] abd does not show any ascites. Will get abd x rays to check for stool impaction.  Last BM was yesterday.     Hypokalemia Replaced    EtOH abuse/alcohol withdrawal Continue with CIWA. No signs of withdrawal.     Hyponatremia Resolved.    Hepatitis C: Viral load reviewed and discussed with the patient. She will need treatment . Will need referral to ID on discharge .    Alcoholic cirrhosis with ascites and esophageal varices on CT  [place] ABD does not show sig ascites for paracentesis. Continue with lasix, lactulose.     Coffee ground emesis:  - resolved.  - resume PPI BID.    Gerd: Continue with PPI.   Seizures:  Resume dilantin.       DVT prophylaxis: scd's Code Status: full code.  Family Communication: none at bedside.  Disposition Plan:pending clinical improvement and improvement in platelet count.   Consultants:   Hematology.   Procedures: none.    Antimicrobials: none.    Subjective: Continues to have abdominal discomfort in the lower quadrant.   Objective: Vitals:   04/10/19 1300 04/10/19 2209 04/11/19 0647 04/11/19 1512  BP: 107/76 95/69 109/73 100/69  Pulse:  90 81 71 82  Resp: '18 20 16 14  '$ Temp: 98.2 F (36.8 C) 97.8 F (36.6 C) 98.7 F (37.1 C) 97.8 F (36.6 C)  TempSrc: Oral Oral Oral   SpO2: 95% 96% 94% 95%  Weight:      Height:        Intake/Output Summary (Last 24 hours) at 04/11/2019 1814 Last data filed at 04/10/2019  1900 Gross per 24 hour  Intake 120 ml  Output 600 ml  Net -480 ml   Filed Weights   04/07/19 0433 04/08/19 0428 04/09/19 0721  Weight: 57.5 kg 59.2 kg 53.2 kg    Examination:  General exam: alert and not in distress.  Respiratory system: Clear to auscultation. Respiratory effort normal. No wheezing or rhonchi.  Cardiovascular system: S1 & S2 heard, RRR.[name] Kitchen No pedal edema. Gastrointestinal system: Abdomen is soft, distended. Some discomfort on the lateral aspects.  Central nervous system: Alert and oriented. Shuffling gait.  Extremities: Symmetric 5 x 5 power. No pedal edema.  Skin: bruising, and petechiae improved.  Psychiatry: . Mood & affect appropriate.     Data Reviewed: I have personally reviewed following labs and imaging studies  CBC: Recent Labs  Lab 04/06/19 1857 04/07/19 0407 04/08/19 0442 04/09/19 0356 04/10/19 0508 04/11/19 0509  WBC 1.0* 2.4* 1.4* 4.9 4.7 3.0*  NEUTROABS 0.7*  --  0.4* 2.7 2.9 1.4*  HGB 12.3 11.6* 12.3 13.1 13.0 13.6  HCT 38.1 35.8* 39.9 41.4 41.0 42.5  MCV 103.0* 102.6* 104.7* 101.7* 101.0* 100.7*  PLT 16* 17* 21* 27* 43* 53*   Basic Metabolic Panel: Recent Labs  Lab 04/05/19 0420 04/06/19 0350 04/07/19 0407 04/08/19 0442 04/09/19 1407 04/10/19 1140  NA 136 134* 135 136  --  136  K 3.5 3.3* 3.7 3.9 4.1 3.5  CL 107 110 109 109  --  102  CO2 20* 19* 18* 21*  --  25  GLUCOSE 114* 115* 92 108*  --  105*  BUN 6 6 5* 5*  --  9  CREATININE 0.55 0.50 0.49 0.59  --  0.66  CALCIUM 8.4* 8.4* 8.0* 8.2*  --  8.8*   GFR: Estimated Creatinine Clearance: 63.1 mL/min (by C-G formula based on SCr of 0.66 mg/dL). Liver Function Tests: Recent Labs  Lab 04/05/19 0420 04/06/19 0350 04/07/19 0407 04/08/19 0442  AST 66* 53* 52* 54*  ALT '30 27 27 30  '$ ALKPHOS 157* 143* 137* 149*  BILITOT 2.1* 1.6* 1.1 1.1  PROT 7.0 8.1 8.5* 8.6*  ALBUMIN 2.8* 2.7* 2.6* 2.7*   No results for input(s): LIPASE, AMYLASE in the last 168 hours. No results for  input(s): AMMONIA in the last 168 hours. Coagulation Profile: No results for input(s): INR, PROTIME in the last 168 hours. Cardiac Enzymes: No results for input(s): CKTOTAL, CKMB, CKMBINDEX, TROPONINI in the last 168 hours. BNP (last 3 results) No results for input(s): PROBNP in the last 8760 hours. HbA1C: No results for input(s): HGBA1C in the last 72 hours. CBG: No results for input(s): GLUCAP in the last 168 hours. Lipid Profile: No results for input(s): CHOL, HDL, LDLCALC, TRIG, CHOLHDL, LDLDIRECT in the last 72 hours. Thyroid Function Tests: No results for input(s): TSH, T4TOTAL, FREET4, T3FREE, THYROIDAB in the last 72 hours. Anemia Panel: No results for input(s): VITAMINB12, FOLATE, FERRITIN, TIBC, IRON, RETICCTPCT in the last 72 hours. Sepsis Labs: No results for input(s): PROCALCITON, LATICACIDVEN in the last 168 hours.  Recent Results (from the past 240 hour(s))  SARS Coronavirus 2 (  CEPHEID - Performed in [facility] hospital lab), Hosp Order     Status: None   Collection Time: 04/02/19  5:52 PM   Specimen: Urine, Clean Catch; Nasopharyngeal  Result Value Ref Range Status   SARS Coronavirus 2 NEGATIVE NEGATIVE Final    Comment: (NOTE) If result is NEGATIVE SARS-CoV-2 target nucleic acids are NOT DETECTED. The SARS-CoV-2 RNA is generally detectable in upper and lower  respiratory specimens during the acute phase of infection. The lowest  concentration of SARS-CoV-2 viral copies this assay can detect is 250  copies / mL. A negative result does not preclude SARS-CoV-2 infection  and should not be used as the sole basis for treatment or other  patient management decisions.  A negative result may occur with  improper specimen collection / handling, submission of specimen other  than nasopharyngeal swab, presence of viral mutation(s) within the  areas targeted by this assay, and inadequate number of viral copies  (<250 copies / mL). A negative result must be combined with  clinical  observations, patient history, and epidemiological information. If result is POSITIVE SARS-CoV-2 target nucleic acids are DETECTED. The SARS-CoV-2 RNA is generally detectable in upper and lower  respiratory specimens dur ing the acute phase of infection.  Positive  results are indicative of active infection with SARS-CoV-2.  Clinical  correlation with patient history and other diagnostic information is  necessary to determine patient infection status.  Positive results do  not rule out bacterial infection or co-infection with other viruses. If result is PRESUMPTIVE POSTIVE SARS-CoV-2 nucleic acids MAY BE PRESENT.   A presumptive positive result was obtained on the submitted specimen  and confirmed on repeat testing.  While 2019 novel coronavirus  (SARS-CoV-2) nucleic acids may be present in the submitted sample  additional confirmatory testing may be necessary for epidemiological  and / or clinical management purposes  to differentiate between  SARS-CoV-2 and other Sarbecovirus currently known to infect humans.  If clinically indicated additional testing with an alternate test  methodology [phone]) is advised. The SARS-CoV-2 RNA is generally  detectable in upper and lower respiratory sp ecimens during the acute  phase of infection. The expected result is Negative. Fact Sheet for Patients:  StrictlyIdeas.no Fact Sheet for Healthcare Providers: BankingDealers.co.za This test is not yet approved or cleared by the [place] FDA and has been authorized for detection and/or diagnosis of SARS-CoV-2 by FDA under an Emergency Use Authorization (EUA).  This EUA will remain in effect (meaning this test can be used) for the duration of the COVID-19 declaration under Section 564(b)(1) of the Act, 21 U.S.C. section 360bbb-3(b)(1), unless the authorization is terminated or revoked sooner. Performed at [facility],  [place] [address], [place], [place] [address]          Radiology Studies: [place] Ascites (abdomen Limited)  Result Date: 04/11/2019 CLINICAL DATA:  Abdominal distension. History of ascites requiring paracentesis. EXAM: LIMITED ABDOMEN ULTRASOUND FOR ASCITES TECHNIQUE: Limited ultrasound survey for ascites was performed in all four abdominal quadrants. COMPARISON:  CT 04/07/2019 FINDINGS: Survey interrogation throughout the abdomen shows no  ascites. IMPRESSION: No ascites.  Paracentesis deferred. Electronically Signed   By: Lucrezia Europe M.D.   On: 04/11/2019 14:18        Scheduled Meds: . FLUoxetine  40 mg Oral Daily  . folic acid  1 mg Oral Daily  . furosemide  40 mg Oral Daily  . gabapentin  600 mg Oral BID  . lactulose  20 g Oral  TID  . multivitamin with minerals  1 tablet Oral Daily  . pantoprazole  40 mg Oral BID  . phenytoin  300 mg Oral Daily  . sodium chloride flush  3 mL Intravenous Q12H  . thiamine  100 mg Oral Daily   Or  . thiamine  100 mg Intravenous Daily  . topiramate  50 mg Oral Daily  . traZODone  100 mg Oral QHS   Continuous Infusions: . sodium chloride    . sodium chloride       LOS: 8 days    Time spent: 26 minutes.     Hosie Poisson, MD [facility] Pager [phone]   If 7PM-7AM, please contact night-coverage [email or web address] Password [facility] 04/11/2019, 6:14 PM

## 2019-04-12 LAB — URINALYSIS, ROUTINE W REFLEX MICROSCOPIC
Bilirubin Urine: NEGATIVE
Glucose, UA: NEGATIVE mg/dL
Hgb urine dipstick: NEGATIVE
Ketones, ur: NEGATIVE mg/dL
Leukocytes,Ua: NEGATIVE
Nitrite: NEGATIVE
Protein, ur: NEGATIVE mg/dL
Specific Gravity, Urine: 1.009 (ref 1.005–1.030)
pH: 7 (ref 5.0–8.0)

## 2019-04-12 LAB — CBC WITH DIFFERENTIAL/PLATELET
Abs Immature Granulocytes: 0.01 10*3/uL (ref 0.00–0.07)
Basophils Absolute: 0 10*3/uL (ref 0.0–0.1)
Basophils Relative: 1 %
Eosinophils Absolute: 0.1 10*3/uL (ref 0.0–0.5)
Eosinophils Relative: 2 %
HCT: 40.9 % (ref 36.0–46.0)
Hemoglobin: 13.3 g/dL (ref 12.0–15.0)
Immature Granulocytes: 0 %
Lymphocytes Relative: 32 %
Lymphs Abs: 0.9 10*3/uL (ref 0.7–4.0)
MCH: 32.7 pg (ref 26.0–34.0)
MCHC: 32.5 g/dL (ref 30.0–36.0)
MCV: 100.5 fL — ABNORMAL HIGH (ref 80.0–100.0)
Monocytes Absolute: 0.6 10*3/uL (ref 0.1–1.0)
Monocytes Relative: 21 %
Neutro Abs: 1.2 10*3/uL — ABNORMAL LOW (ref 1.7–7.7)
Neutrophils Relative %: 44 %
Platelets: 59 10*3/uL — ABNORMAL LOW (ref 150–400)
RBC: 4.07 MIL/uL (ref 3.87–5.11)
RDW: 18.6 % — ABNORMAL HIGH (ref 11.5–15.5)
WBC: 2.8 10*3/uL — ABNORMAL LOW (ref 4.0–10.5)
nRBC: 0 % (ref 0.0–0.2)

## 2019-04-12 LAB — BASIC METABOLIC PANEL
Anion gap: 6 (ref 5–15)
Anion gap: 9 (ref 5–15)
BUN: 10 mg/dL (ref 6–20)
BUN: 9 mg/dL (ref 6–20)
CO2: 24 mmol/L (ref 22–32)
CO2: 24 mmol/L (ref 22–32)
Calcium: 8.4 mg/dL — ABNORMAL LOW (ref 8.9–10.3)
Calcium: 8.2 mg/dL — ABNORMAL LOW (ref 8.9–10.3)
Chloride: 103 mmol/L (ref 98–111)
Chloride: 104 mmol/L (ref 98–111)
Creatinine, Ser: 0.64 mg/dL (ref 0.44–1.00)
Creatinine, Ser: 0.62 mg/dL (ref 0.44–1.00)
GFR calc Af Amer: >60 mL/min (ref >60)
GFR calc Af Amer: >60 mL/min (ref >60)
GFR calc non Af Amer: >60 mL/min (ref >60)
GFR calc non Af Amer: >60 mL/min (ref >60)
Glucose, Bld: 101 mg/dL — ABNORMAL HIGH (ref 70–99)
Glucose, Bld: 117 mg/dL — ABNORMAL HIGH (ref 70–99)
Potassium: 6 mmol/L — ABNORMAL HIGH (ref 3.5–5.1)
Potassium: 3 mmol/L — ABNORMAL LOW (ref 3.5–5.1)
Sodium: 133 mmol/L — ABNORMAL LOW (ref 135–145)
Sodium: 137 mmol/L (ref 135–145)

## 2019-04-12 MED ORDER — FUROSEMIDE 10 MG/ML IJ SOLN
40.0000 mg | Freq: Once | INTRAMUSCULAR | Status: DC
  Filled 2019-04-12: qty 4

## 2019-04-12 MED ORDER — BISACODYL 5 MG PO TBEC
10.0000 mg | DELAYED_RELEASE_TABLET | Freq: Every day | ORAL | Status: DC | PRN
  Administered 2019-04-12: 10 mg via ORAL
  Filled 2019-04-12: qty 2

## 2019-04-12 MED ORDER — POLYETHYLENE GLYCOL 3350 17 G PO PACK
17.0000 g | PACK | Freq: Two times a day (BID) | ORAL | Status: DC
  Administered 2019-04-12 – 2019-04-13 (×3): 17 g via ORAL
  Filled 2019-04-12 (×3): qty 1

## 2019-04-12 MED ORDER — SODIUM POLYSTYRENE SULFONATE 15 GM/60ML PO SUSP
30.0000 g | Freq: Once | ORAL | Status: AC
  Administered 2019-04-12: 10:00:00 30 g via ORAL
  Filled 2019-04-12: qty 120

## 2019-04-12 MED ORDER — ROMIPLOSTIM 250 MCG ~~LOC~~ SOLR
1.0000 ug/kg | Freq: Once | SUBCUTANEOUS | Status: AC
  Administered 2019-04-12: 55 ug via SUBCUTANEOUS
  Filled 2019-04-12: qty 0.11

## 2019-04-12 MED ORDER — POLYETHYLENE GLYCOL 3350 17 G PO PACK: 17.0000 g | PACK | Freq: Two times a day (BID) | ORAL | Status: DC

## 2019-04-12 NOTE — Progress Notes (Signed)
Assumed care of pt from 330-7p, Pt stable. Cont. With plan of care. No results from laxative given during this RN's shift

## 2019-04-12 NOTE — Progress Notes (Signed)
So far, it looks like her platelet count is come up quite nicely.  Yesterday, her platelet count was up to 53,000.  It is hard to say whether this was the discontinuation of her alcohol use while in the hospital or if this was from the immunosuppressive therapy that she received.  She does have Hepatitis C.  She really, really needs to have this treated.  She MUST be referred to a hepatitis specialist for therapy.  Today, her platelet count 59,000.  Her white cell count is 2.8.  I am unsure she is going home today or if she is being discharged to a rehab facility.  She said that she feels better.  The bruises are much better.  On her physical exam, her temperature is 98.5.  Pulse is 73.  Blood pressure 94/76.  Her lungs are clear.  Cardiac exam regular rate and rhythm.  Her ocular exam shows no scleral icterus.  Skin exam shows nice improvement in her ecchymoses.  Neurological exam is nonfocal.  I will go ahead and give her 1 more dose of Nplate.  This is just to be "proactive" in case the thrombocytopenia is immune based.  I am just glad to see that her platelet count has come up nicely.  I think the real issue now is whether or not she will stop drinking.  She needs to have her hepatitis C treated.  I appreciate the great care that she has gotten from everybody on [place], MD  Psalm 56:4

## 2019-04-12 NOTE — Progress Notes (Signed)
Physical Therapy Treatment Patient Details Name: Briana Sweeney MRN: [id number] DOB: 17-Jun-1971 Today's Date: 04/12/2019    History of Present Illness 48 y.o. female,   polysubstance abuse, chronic alcoholism, hepatitis C, liver cirrhosis, chronic pancytopenia, bipolar disorder with anxiety features brought to the ED on 5/13 from home after a fall. Dx of thrombocytopenia.    PT Comments    Pt is intermittently unsteady. She politely declines assistive devices. Unsure if she will agree to HHPT f/u or if it is even available to her. Pt states she is hoping to d/c home later today.   Follow Up Recommendations  Home health PT;Supervision - Intermittent     Equipment Recommendations  None recommended by PT(pt declines assistive device)    Recommendations for Other Services       Precautions / Restrictions Precautions Precautions: Fall Restrictions Weight Bearing Restrictions: No    Mobility  Bed Mobility Overal bed mobility: Independent                Transfers Overall transfer level: Needs assistance   Transfers: Sit to/from Stand Sit to Stand: Supervision         General transfer comment: for safety  Ambulation/Gait Ambulation/Gait assistance: Min guard Gait Distance (Feet): 250 Feet Assistive device: None Gait Pattern/deviations: Decreased stride length;Trunk flexed;Staggering left;Drifts right/left     General Gait Details: unsteady at times but no overt LOB. Pt tolerated distance well.   Stairs             Wheelchair Mobility    Modified Rankin (Stroke Patients Only)       Balance Overall balance assessment: Needs assistance         Standing balance support: No upper extremity supported Standing balance-Leahy Scale: Fair                              Cognition Arousal/Alertness: Awake/alert Behavior During Therapy: WFL for tasks assessed/performed Overall Cognitive Status: Within Functional Limits for tasks assessed                                         Exercises General Exercises - Lower Extremity Hip ABduction/ADduction: AROM;Right;Left;10 reps;Standing Hip Flexion/Marching: AROM;Right;Left;10 reps;Standing Heel Raises: AROM;10 reps;Standing;Both    General Comments        Pertinent Vitals/Pain Pain Assessment: 0-10 Pain Score: 7  Pain Location: abdomen Pain Descriptors / Indicators: Discomfort;Pressure Pain Intervention(s): Monitored during session    Home Living                      Prior Function            PT Goals (current goals can now be found in the care plan section) Progress towards PT goals: Progressing toward goals    Frequency    Min 3X/week      PT Plan Current plan remains appropriate    Co-evaluation              AM-PAC PT "6 Clicks" Mobility   Outcome Measure  Help needed turning from your back to your side while in a flat bed without using bedrails?: None Help needed moving from lying on your back to sitting on the side of a flat bed without using bedrails?: None Help needed moving to and from a bed to a chair (including a wheelchair)?: A Little  Help needed standing up from a chair using your arms (e.g., wheelchair or bedside chair)?: A Little Help needed to walk in hospital room?: A Little Help needed climbing 3-5 steps with a railing? : A Little 6 Click Score: 20    End of Session Equipment Utilized During Treatment: Gait belt Activity Tolerance: Patient tolerated treatment well Patient left: in bed;with call bell/phone within reach;with bed alarm set   PT Visit Diagnosis: History of falling (Z91.81);Muscle weakness (generalized) (M62.81);Difficulty in walking, not elsewhere classified (R26.2)     Time: 0802-2336 PT Time Calculation (min) (ACUTE ONLY): 15 min  Charges:  $Gait Training: 8-22 mins                        Weston Anna, PT Acute Rehabilitation Services Pager: [phone] Office:  [phone]

## 2019-04-12 NOTE — Progress Notes (Signed)
PROGRESS NOTE    Briana Sweeney  MRN:[id number] DOB: 06/02/1971 DOA: 04/02/2019 PCP: Stroud, Natalie M, FNP    Brief Narrative:   Per HPI:  47 y.o.female,polysubstance abuse,chronic alcoholism, hepatitis C, liver cirrhosis, chronic pancytopenia, bipolar disorder with anxiety features brought to the ED on 5/13 from home after a fall.Patient has continued to drink alcohol despite her advanced liver disease. She was apparently at a friend's house after drinking excessively when she fell and hit her head. Patient lost some consciousness briefly. Pt c/o easy bruising, dizziness, and fall 4 days ago where she hit her head on bathtub.   In ED,VSS, CT brain- no acute abnormality. 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 labs with platelets of 11k, abnormal lfts Ast 148, Alt 49, Alk phos 202, T. Bili 4.8, INR 1.3, Urinalysis negative,Covid-19 negative Hem/onc consulted and admitted Status post on unit platelet transfusion then Nplate was given 7/27 Starting immunoglobulin and a steroid.  Remains on CIWA Ativan and Librium taper. 7/31 abdominal distention possibly ascites but her platelet count too low for US paracentesis.   Assessment & Plan:   Principal Problem:   Thrombocytopenia (HCC) Active Problems:   Hypokalemia   Alcoholic cirrhosis of liver with ascites (HCC)   Alcohol abuse with alcohol-induced mood disorder (HCC)   MDD (major depressive disorder), severe (HCC)   Alcohol dependence (HCC)   Dizziness   Bruising   Hyponatremia    Severe thrombocytopenia Probably secondary to marrow toxicity from alcohol liver cirrhosis. S/p platelet transfusion but  without any improvement in platelet count. Hematology on board and she underwent endplate infusion and IV steroids and IVIG . Much improved platelet count . Watch for spontaneous bleeding.  Her bruising has improved. No gum bleeding today.    Persistent dizziness and headache,  MRI brain showed signs of alcohol abuse.  No blurry vision.  Pt has a h/o of chronic alcohol abuse and has unsteady gait.     Flank pain and back pain:  No intrabdominal bleeding seen on CT. US abd does not show any ascites. Will get abd x rays to check for stool impaction.  Last BM was yesterday.     Hypokalemia Replaced    EtOH abuse/alcohol withdrawal Continue with CIWA. No signs of withdrawal.     Hyponatremia Resolved.    Hepatitis C: Viral load reviewed and discussed with the patient. She will need treatment . Will need referral to ID on discharge .    Alcoholic cirrhosis with ascites and esophageal varices on CT  US ABD does not show sig ascites for paracentesis. Continue with lasix, lactulose.     Coffee ground emesis:  - resolved.  - resume PPI BID.    Gerd: Continue with PPI.   Seizures:  Resume dilantin.    Constipation:  Add miralax and dulcolax.    DVT prophylaxis: scd's Code Status: full code.  Family Communication: none at bedside.  Disposition Plan:pending clinical improvement and improvement in platelet count.   Consultants:   Hematology.   Procedures: none.    Antimicrobials: none.    Subjective: abd discomfort is slightly worse.   Objective: Vitals:   04/11/19 2202 04/12/19 0429 04/12/19 1304 04/12/19 1342  BP: (!) 87/62 94/76 (!) 85/61 93/60  Pulse: 75   73 85 80  Resp: 18 18 18   Temp: 98.5 F (36.9 C) 98.5 F (36.9 C) 98.2 F (36.8 C) 98.5 F (36.9 C)  TempSrc:   Oral Oral  SpO2: 94% 96% 95%   Weight:      Height:        Intake/Output Summary (Last 24 hours) at 04/12/2019 1442 Last data filed at 04/12/2019 0949 Gross per 24 hour   Intake 360 ml  Output -  Net 360 ml   Filed Weights   04/07/19 0433 04/08/19 0428 04/09/19 0721  Weight: 57.5 kg 59.2 kg 53.2 kg    Examination:  General exam: alert and not in distress.  Respiratory system: Clear to auscultation. Respiratory effort normal. No wheezing or rhonchi.  Cardiovascular system: S1 & S2 heard, RRR.. No pedal edema. Gastrointestinal system: Abdomen is soft, distended. Some discomfort on the lateral aspects.  Central nervous system: Alert and oriented. Shuffling gait.  Extremities: Symmetric 5 x 5 power. No pedal edema.  Skin: bruising, and petechiae improved.  Psychiatry: . Mood & affect appropriate.     Data Reviewed: I have personally reviewed following labs and imaging studies  CBC: Recent Labs  Lab 04/08/19 0442 04/09/19 0356 04/10/19 0508 04/11/19 0509 04/12/19 0414  WBC 1.4* 4.9 4.7 3.0* 2.8*  NEUTROABS 0.4* 2.7 2.9 1.4* 1.2*  HGB 12.3 13.1 13.0 13.6 13.3  HCT 39.9 41.4 41.0 42.5 40.9  MCV 104.7* 101.7* 101.0* 100.7* 100.5*  PLT 21* 27* 43* 53* 59*   Basic Metabolic Panel: Recent Labs  Lab 04/06/19 0350 04/07/19 0407 04/08/19 0442 04/09/19 1407 04/10/19 1140 04/12/19 0414  NA 134* 135 136  --  136 133*  K 3.3* 3.7 3.9 4.1 3.5 6.0*  CL 110 109 109  --  102 103  CO2 19* 18* 21*  --  25 24  GLUCOSE 115* 92 108*  --  105* 101*  BUN 6 5* 5*  --  9 10  CREATININE 0.50 0.49 0.59  --  0.66 0.64  CALCIUM 8.4* 8.0* 8.2*  --  8.8* 8.4*   GFR: Estimated Creatinine Clearance: 63.1 mL/min (by C-G formula based on SCr of 0.64 mg/dL). Liver Function Tests: Recent Labs  Lab 04/06/19 0350 04/07/19 0407 04/08/19 0442  AST 53* 52* 54*  ALT 27 27 30  ALKPHOS 143* 137* 149*  BILITOT 1.6* 1.1 1.1  PROT 8.1 8.5* 8.6*  ALBUMIN 2.7* 2.6* 2.7*   No results for input(s): LIPASE, AMYLASE in the last 168 hours. No results for input(s): AMMONIA in the last 168 hours. Coagulation Profile: No results for input(s): INR, PROTIME in the last 168  hours. Cardiac Enzymes: No results for input(s): CKTOTAL, CKMB, CKMBINDEX, TROPONINI in the last 168 hours. BNP (last 3 results) No results for input(s): PROBNP in the last 8760 hours. HbA1C: No results for input(s): HGBA1C in the last 72 hours. CBG: No results for input(s): GLUCAP in the last 168 hours. Lipid Profile: No results for input(s): CHOL, HDL, LDLCALC, TRIG, CHOLHDL, LDLDIRECT in the last 72 hours. Thyroid Function Tests: No results for input(s): TSH, T4TOTAL, FREET4, T3FREE, THYROIDAB in the last 72 hours. Anemia Panel: No results for input(s): VITAMINB12, FOLATE, FERRITIN, TIBC, IRON, RETICCTPCT in the last 72 hours. Sepsis Labs: No results for input(s): PROCALCITON, LATICACIDVEN in the last 168 hours.  Recent Results (from the past 240 hour(s))  SARS Coronavirus 2 (CEPHEID - Performed in [place] hospital lab), Hosp Order     Status: None   Collection Time:   04/02/19  5:52 PM   Specimen: Urine, Clean Catch; Nasopharyngeal  Result Value Ref Range Status   SARS Coronavirus 2 NEGATIVE NEGATIVE Final    Comment: (NOTE) If result is NEGATIVE SARS-CoV-2 target nucleic acids are NOT DETECTED. The SARS-CoV-2 RNA is generally detectable in upper and lower  respiratory specimens during the acute phase of infection. The lowest  concentration of SARS-CoV-2 viral copies this assay can detect is 250  copies / mL. A negative result does not preclude SARS-CoV-2 infection  and should not be used as the sole basis for treatment or other  patient management decisions.  A negative result may occur with  improper specimen collection / handling, submission of specimen other  than nasopharyngeal swab, presence of viral mutation(s) within the  areas targeted by this assay, and inadequate number of viral copies  (<250 copies / mL). A negative result must be combined with clinical  observations, patient history, and epidemiological information. If result is POSITIVE SARS-CoV-2 target  nucleic acids are DETECTED. The SARS-CoV-2 RNA is generally detectable in upper and lower  respiratory specimens dur ing the acute phase of infection.  Positive  results are indicative of active infection with SARS-CoV-2.  Clinical  correlation with patient history and other diagnostic information is  necessary to determine patient infection status.  Positive results do  not rule out bacterial infection or co-infection with other viruses. If result is PRESUMPTIVE POSTIVE SARS-CoV-2 nucleic acids MAY BE PRESENT.   A presumptive positive result was obtained on the submitted specimen  and confirmed on repeat testing.  While 2019 novel coronavirus  (SARS-CoV-2) nucleic acids may be present in the submitted sample  additional confirmatory testing may be necessary for epidemiological  and / or clinical management purposes  to differentiate between  SARS-CoV-2 and other Sarbecovirus currently known to infect humans.  If clinically indicated additional testing with an alternate test  methodology [phone]) is advised. The SARS-CoV-2 RNA is generally  detectable in upper and lower respiratory sp ecimens during the acute  phase of infection. The expected result is Negative. Fact Sheet for Patients:  StrictlyIdeas.no Fact Sheet for Healthcare Providers: BankingDealers.co.za This test is not yet approved or cleared by the [place] FDA and has been authorized for detection and/or diagnosis of SARS-CoV-2 by FDA under an Emergency Use Authorization (EUA).  This EUA will remain in effect (meaning this test can be used) for the duration of the COVID-19 declaration under Section 564(b)(1) of the Act, 21 U.S.C. section 360bbb-3(b)(1), unless the authorization is terminated or revoked sooner. Performed at [facility], [place] [address] [place], [place] [address]          Radiology Studies: Dg Abd 1 View  Result Date: 04/11/2019  CLINICAL DATA:  Severe abdominal pain.  Constipation. EXAM: ABDOMEN - 1 VIEW COMPARISON:  01/22/2019 FINDINGS: No abnormal bowel dilatation. Moderate stool burden noted within the colon. No radio-opaque calculi or other significant radiographic abnormality are seen. IMPRESSION: 1. Nonobstructive bowel gas pattern. 2. Moderate stool burden within the colon which may reflect constipation. Electronically Signed   By: Kerby Moors M.D.   On: 04/11/2019 19:22   [place] Ascites (abdomen Limited)  Result Date: 04/11/2019 CLINICAL DATA:  Abdominal distension. History of ascites requiring paracentesis. EXAM: LIMITED ABDOMEN ULTRASOUND FOR ASCITES TECHNIQUE: Limited ultrasound survey for ascites was performed in all four abdominal quadrants. COMPARISON:  CT 04/07/2019 FINDINGS: Survey interrogation throughout the abdomen shows no  ascites. IMPRESSION: No ascites.  Paracentesis deferred. Electronically Signed  By: Lucrezia Europe M.D.   On: 04/11/2019 14:18        Scheduled Meds: . FLUoxetine  40 mg Oral Daily  . folic acid  1 mg Oral Daily  . furosemide  40 mg Intravenous Once  . gabapentin  600 mg Oral BID  . lactulose  20 g Oral TID  . multivitamin with minerals  1 tablet Oral Daily  . pantoprazole  40 mg Oral BID  . phenytoin  300 mg Oral Daily  . polyethylene glycol  17 g Oral BID  . sodium chloride flush  3 mL Intravenous Q12H  . thiamine  100 mg Oral Daily   Or  . thiamine  100 mg Intravenous Daily  . topiramate  50 mg Oral Daily  . traZODone  100 mg Oral QHS   Continuous Infusions: . sodium chloride    . sodium chloride       LOS: 9 days    Time spent: 26 minutes.     Hosie Poisson, MD [facility] Pager [phone]   If 7PM-7AM, please contact night-coverage [email or web address] Password [email or web address] 04/12/2019, 2:42 PM

## 2019-04-13 LAB — CBC WITH DIFFERENTIAL/PLATELET
Abs Immature Granulocytes: 0.01 10*3/uL (ref 0.00–0.07)
Basophils Absolute: 0 10*3/uL (ref 0.0–0.1)
Basophils Relative: 2 %
Eosinophils Absolute: 0.1 10*3/uL (ref 0.0–0.5)
Eosinophils Relative: 2 %
HCT: 41.2 % (ref 36.0–46.0)
Hemoglobin: 12.9 g/dL (ref 12.0–15.0)
Immature Granulocytes: 0 %
Lymphocytes Relative: 31 %
Lymphs Abs: 0.8 10*3/uL (ref 0.7–4.0)
MCH: 31.9 pg (ref 26.0–34.0)
MCHC: 31.3 g/dL (ref 30.0–36.0)
MCV: 102 fL — ABNORMAL HIGH (ref 80.0–100.0)
Monocytes Absolute: 0.5 10*3/uL (ref 0.1–1.0)
Monocytes Relative: 21 %
Neutro Abs: 1.1 10*3/uL — ABNORMAL LOW (ref 1.7–7.7)
Neutrophils Relative %: 44 %
Platelets: 66 10*3/uL — ABNORMAL LOW (ref 150–400)
RBC: 4.04 MIL/uL (ref 3.87–5.11)
RDW: 18 % — ABNORMAL HIGH (ref 11.5–15.5)
WBC: 2.5 10*3/uL — ABNORMAL LOW (ref 4.0–10.5)
nRBC: 0 % (ref 0.0–0.2)

## 2019-04-13 LAB — BASIC METABOLIC PANEL
Anion gap: 9 (ref 5–15)
BUN: 10 mg/dL (ref 6–20)
CO2: 24 mmol/L (ref 22–32)
Calcium: 8.5 mg/dL — ABNORMAL LOW (ref 8.9–10.3)
Chloride: 102 mmol/L (ref 98–111)
Creatinine, Ser: 0.58 mg/dL (ref 0.44–1.00)
GFR calc Af Amer: >60 mL/min (ref >60)
GFR calc non Af Amer: >60 mL/min (ref >60)
Glucose, Bld: 111 mg/dL — ABNORMAL HIGH (ref 70–99)
Potassium: 3.6 mmol/L (ref 3.5–5.1)
Sodium: 135 mmol/L (ref 135–145)

## 2019-04-13 LAB — HCV RNA QUANT RFLX ULTRA OR GENOTYP
HCV RNA Qnt(log copy/mL): 4.695 log10 IU/mL
HepC Qn: 49500 IU/mL

## 2019-04-13 LAB — HEPATITIS C GENOTYPE: Hepatitis C Genotype: UNDETERMINED

## 2019-04-13 MED ORDER — PHENYTOIN SODIUM EXTENDED 300 MG PO CAPS: 300.0000 mg | ORAL_CAPSULE | Freq: Every day | ORAL | 3 refills | Status: DC

## 2019-04-13 MED ORDER — FUROSEMIDE 40 MG PO TABS: 40.0000 mg | ORAL_TABLET | Freq: Every day | ORAL | 3 refills | Status: DC

## 2019-04-13 MED ORDER — HYDROXYZINE HCL 25 MG PO TABS: 25.0000 mg | ORAL_TABLET | Freq: Four times a day (QID) | ORAL | 0 refills | Status: DC | PRN

## 2019-04-13 MED ORDER — ALUM & MAG HYDROXIDE-SIMETH 200-200-20 MG/5ML PO SUSP: 15.0000 mL | ORAL | 0 refills | Status: DC | PRN

## 2019-04-13 MED ORDER — TRAZODONE HCL 50 MG PO TABS: 100.0000 mg | ORAL_TABLET | Freq: Every day | ORAL | 3 refills | Status: DC

## 2019-04-13 MED ORDER — FLUOXETINE HCL 40 MG PO CAPS: 40.0000 mg | ORAL_CAPSULE | Freq: Every day | ORAL | 3 refills | Status: DC

## 2019-04-13 MED ORDER — POLYETHYLENE GLYCOL 3350 17 G PO PACK: 17.0000 g | PACK | Freq: Two times a day (BID) | ORAL | 0 refills | Status: DC

## 2019-04-13 MED ORDER — FOLIC ACID 1 MG PO TABS: 1.0000 mg | ORAL_TABLET | Freq: Every day | ORAL | 3 refills | Status: DC

## 2019-04-13 MED ORDER — ADULT MULTIVITAMIN W/MINERALS CH: 1.0000 | ORAL_TABLET | Freq: Every day | ORAL | Status: DC

## 2019-04-13 MED ORDER — GABAPENTIN 300 MG PO CAPS: ORAL_CAPSULE | ORAL | 3 refills | Status: DC

## 2019-04-13 MED ORDER — PANTOPRAZOLE SODIUM 40 MG PO TBEC: 40.0000 mg | DELAYED_RELEASE_TABLET | Freq: Two times a day (BID) | ORAL | 2 refills | Status: DC

## 2019-04-13 MED ORDER — THIAMINE HCL 100 MG PO TABS: 100.0000 mg | ORAL_TABLET | Freq: Every day | ORAL | 0 refills | Status: DC

## 2019-04-13 MED ORDER — TOPIRAMATE 50 MG PO TABS: 50.0000 mg | ORAL_TABLET | Freq: Every day | ORAL | 2 refills | Status: DC

## 2019-04-13 MED ORDER — LACTULOSE 10 GM/15ML PO SOLN: 20.0000 g | Freq: Three times a day (TID) | ORAL | 3 refills | Status: DC

## 2019-04-13 NOTE — Plan of Care (Signed)
  Problem: Health Behavior/Discharge Planning: Goal: Ability to manage health-related needs will improve Outcome: Adequate for Discharge   Problem: Clinical Measurements: Goal: Ability to maintain clinical measurements within normal limits will improve Outcome: Adequate for Discharge Goal: Will remain free from infection Outcome: Adequate for Discharge Goal: Diagnostic test results will improve Outcome: Adequate for Discharge Goal: Respiratory complications will improve Outcome: Adequate for Discharge Goal: Cardiovascular complication will be avoided Outcome: Adequate for Discharge   Problem: Nutrition: Goal: Adequate nutrition will be maintained Outcome: Adequate for Discharge   Problem: Coping: Goal: Level of anxiety will decrease Outcome: Adequate for Discharge   Problem: Elimination: Goal: Will not experience complications related to bowel motility Outcome: Adequate for Discharge Goal: Will not experience complications related to urinary retention Outcome: Adequate for Discharge   Problem: Skin Integrity: Goal: Risk for impaired skin integrity will decrease Outcome: Adequate for Discharge

## 2019-04-13 NOTE — Progress Notes (Signed)
Pt being discharged home. Discharge instructions were reviewed with pt at bedside. Home health PT was set up. No questions or concerns from the pt at this time. Currently awaiting for ride to pick up pt.

## 2019-04-13 NOTE — Progress Notes (Signed)
Dose of Lactulose and Miralaax given at bedtime. Pt. Reported she had a small BM this morning. Pt. Educated of plan of care with continuance of lactulose and miralaax. Pt. Agreeable with plan. Will continue to monitor

## 2019-04-13 NOTE — TOC Progression Note (Signed)
Transition of Care [facility]) - Progression Note    Patient Details  Name: Briana Sweeney MRN: [id number] Date of Birth: 1971/05/05  [facility] CM/SW Contact  Joaquin Courts, RN Phone Number: 04/13/2019, 10:16 AM  Clinical Narrative:    CM spoke with patient at bedside. [facility] to provide HHPT.   Expected Discharge Plan: [place] Barriers to Discharge: No Barriers Identified  Expected Discharge Plan and Services Expected Discharge Plan: [place] In-house Referral: Clinical Social Work Discharge Planning Services: CM Consult   Living arrangements for the past 2 months: [place] Expected Discharge Date: 04/13/19               DME Arranged: N/A DME Agency: NA       HH Arranged: PT [place] Agency: [facility] (formerly [employer]) Date [place]: 04/13/19 Time [place]: [place] Representative spoke with at [place]: [place] ([place]) Interventions    Readmission Risk Interventions Readmission Risk Prevention Plan 04/12/2019  Transportation Screening Complete  Medication Review Press photographer) Complete  PCP or Specialist appointment within 3-5 days of discharge Not Complete  PCP/Specialist Appt Not Complete comments not ready for d/c  HRI or [place] Complete  SW Recovery Care/Counseling Consult Complete  [place] Not Applicable

## 2019-04-14 MED FILL — FOLIC ACID 1 MG TABS: 1 | 30 days supply | Qty: 30 | Fill #0

## 2019-04-14 MED FILL — hydrOXYzine HCL 25 MG TABS: 25 | 30 days supply | Qty: 90 | Fill #2

## 2019-04-14 MED FILL — PANTOPRAZOLE SOD DR 40 MG T: 40 | 30 days supply | Qty: 60 | Fill #0

## 2019-04-14 MED FILL — PHENYTOIN SOD EXT 300 MG CA: 300 | 30 days supply | Qty: 30 | Fill #1

## 2019-04-14 MED FILL — FUROSEMIDE 40 MG TAB: 40 | 30 days supply | Qty: 30 | Fill #0

## 2019-04-14 MED FILL — busPIRone HCL 10 MG TABS: 10 | 15 days supply | Qty: 30 | Fill #2

## 2019-04-14 MED FILL — GABAPENTIN 300 MG CAPSULE: 300 | 30 days supply | Qty: 120 | Fill #0

## 2019-04-14 MED FILL — LACTULOSE 10 GM/15 ML SOLN: 10 | 20 days supply | Qty: 1892 | Fill #0

## 2019-04-14 MED FILL — traZODone HCL 50 MG TABS: 50 | 15 days supply | Qty: 30 | Fill #0

## 2019-04-14 MED FILL — POLYETHYLENE GLYCOL 3350 PO: 17 | 7 days supply | Qty: 238 | Fill #0

## 2019-04-14 MED FILL — FLUoxetine HCL 40 MG CAPS: 40 | 30 days supply | Qty: 30 | Fill #0

## 2019-04-14 MED FILL — TOPIRAMATE 50 MG TABLET: 50 | 30 days supply | Qty: 30 | Fill #2

## 2019-04-14 NOTE — Discharge Summary (Addendum)
Physician Discharge Summary  Briana Sweeney YKZ:[id number] DOB: 12-Feb-1971 DOA: 04/02/2019  PCP: [name], FNP  Admit date: 04/02/2019 Discharge date: 04/13/2019  Admitted From: Home.  Disposition: Home   Recommendations for Outpatient Follow-up:  1. Follow up with PCP in 1-2 weeks 2. Please obtain BMP/CBC in one week 3. Please follow up with ID for HEP C treatment.   Home Health:yes   Discharge Condition:stable.  CODE STATUS: full code.  Diet recommendation: Heart Healthy   Brief/Interim Summary: Per HPI: 48 y.o.female,polysubstance abuse,chronic alcoholism, hepatitis C, liver cirrhosis, chronic pancytopenia, bipolar disorder with anxiety features brought to the ED on 5/13 from home after a fall.Patient has continued to drink alcohol despite her advanced liver disease. She was apparently at a friend's house after drinking excessively when she fell and hit her head. Patient lost some consciousness briefly. Pt c/o easy bruising, dizziness, and fall 4 days ago where she hit her head on bathtub.   In ED,VSS, CT brain- noacute abnormality. 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 labs with platelets of 11k, abnormal lfts Ast 148, Alt 49, Alk phos 202, T. Bili 4.8, INR 1.3, Urinalysis negative,Covid-19 negative Hem/onc consulted and admitted Status post on unit platelet transfusion then Nplate was given [id number] immunoglobulin and a steroid.Remains on CIWA Ativan and Librium taper. .    Discharge Diagnoses:  Principal Problem:   Thrombocytopenia ([place]) Active Problems:   Hypokalemia   Alcoholic cirrhosis of liver with ascites (HCC)    Alcohol abuse with alcohol-induced mood disorder (HCC)   MDD (major depressive disorder), severe (HCC)   Alcohol dependence (HCC)   Dizziness   Bruising   Hyponatremia  Severe thrombocytopenia Probably secondary to marrow toxicity from alcohol liver cirrhosis. S/p platelet transfusion but without any improvement in platelet count. Hematology on board and she underwent endplate infusion and IV steroids and IVIG . Much improved platelet count . Watch for spontaneous bleeding.  Her bruising has improved. No gum bleeding today.    Persistent dizziness and headache,  MRI brain showed signs of alcohol abuse.  No blurry vision.  Pt has a h/o of chronic alcohol abuse and has unsteady gait.     Flank pain and back pain:  No intrabdominal bleeding seen on CT. Back pain resolved.    Hypokalemia Replaced    EtOH abuse/alcohol withdrawal Continue with CIWA. No signs of withdrawal.     Hyponatremia Resolved.    Hepatitis C: Viral load reviewed and discussed with the patient. She will need treatment . Will need referral to ID on discharge .    Alcoholic cirrhosis with ascites and esophageal varices on CT  [place] ABD does not show sig ascites for paracentesis. Continue with lasix, lactulose.     Coffee ground emesis:  - resolved.  - resume PPI BID.    Gerd: Continue with PPI.   Seizures:  Resume dilantin.    Constipation:  Added miralax     Discharge Instructions  Discharge Instructions    Diet - low sodium heart healthy   Complete by: As directed    Increase activity slowly   Complete by: As directed      Allergies as of 04/13/2019      Reactions   Haldol [haloperidol] Other (See Comments)  Made her feel bad      Medication List    STOP taking these medications   famotidine 20 MG tablet Commonly known as: PEPCID   naltrexone 50 MG tablet Commonly known as: DEPADE     TAKE these medications   alum & mag  hydroxide-simeth 200-200-20 MG/5ML suspension Commonly known as: MAALOX/MYLANTA Take 15 mLs by mouth every 4 (four) hours as needed for indigestion or heartburn.   FLUoxetine 40 MG capsule Commonly known as: PROZAC Take 1 capsule (40 mg total) by mouth daily. Notes to patient: 8/5   folic acid 1 MG tablet Commonly known as: FOLVITE Take 1 tablet (1 mg total) by mouth daily. Notes to patient: 8/5   furosemide 40 MG tablet Commonly known as: Lasix Take 1 tablet (40 mg total) by mouth daily. Notes to patient: 8/5   gabapentin 300 MG capsule Commonly known as: NEURONTIN Take 2 capsules 2 times a day. What changed:   how much to take  when to take this  additional instructions Notes to patient: 8/4 evening   hydrOXYzine 25 MG tablet Commonly known as: ATARAX/VISTARIL Take 1 tablet (25 mg total) by mouth every 6 (six) hours as needed for itching. What changed: reasons to take this   lactulose 10 GM/15ML solution Commonly known as: CHRONULAC Take 30 mLs (20 g total) by mouth 3 (three) times daily. Goal to have 2-3 BMs daily Notes to patient: 8/4 afternoon   multivitamin with minerals Tabs tablet Take 1 tablet by mouth daily. Notes to patient: 8/5   pantoprazole 40 MG tablet Commonly known as: PROTONIX Take 1 tablet (40 mg total) by mouth 2 (two) times daily. Notes to patient: 8/4 evenign   phenytoin 300 MG ER capsule Commonly known as: DILANTIN Take 1 capsule (300 mg total) by mouth daily. Notes to patient: 8/5   polyethylene glycol 17 g packet Commonly known as: MIRALAX / GLYCOLAX Take 17 g by mouth 2 (two) times daily. Notes to patient: 8/4 evening   thiamine 100 MG tablet Take 1 tablet (100 mg total) by mouth daily. Notes to patient: 8/5   topiramate 50 MG tablet Commonly known as: TOPAMAX Take 1 tablet (50 mg total) by mouth daily. Notes to patient: 8/5   traZODone 50 MG tablet Commonly known as: DESYREL Take 2 tablets (100 mg total) by mouth at  bedtime. Notes to patient: 8/4      Follow-up [place], [place], FNP. Schedule an appointment as soon as possible for a visit in 1 week(s).   Specialty: Family Medicine Contact information: [place] [id number] [facility]             . Schedule an appointment as soon as possible for a visit in 1 week(s).   Why: for Hepatitis C treatment.  Contact information: [address] [place] [address]       Home, [facility] Follow up.   Specialty: Home Health Services Why: agency will provide home health physical therapy. agency will call you to schedule first visit. Contact information: [address] [place] [address] [phone]          Allergies  Allergen Reactions  . Haldol [Haloperidol] Other (See Comments)    Made her feel bad    Consultations:  Hematology.    Procedures/Studies: Ct Abdomen Pelvis Wo Contrast  Result Date: 04/07/2019  CLINICAL DATA:  Severe thrombocytopenia.  Evaluate for bleeding. EXAM: CT ABDOMEN AND PELVIS WITHOUT CONTRAST TECHNIQUE: Multidetector CT imaging of the abdomen and pelvis was performed following the standard protocol without IV contrast. COMPARISON:  04/02/2019 FINDINGS: Lower chest: Dependent atelectasis noted left lower lobe. Calcified granuloma identified in the left lower lobe. Hepatobiliary: Low attenuation of liver parenchyma is compatible with fatty deposition. There is hypertrophy of the lateral segment left liver with diffuse nodularity of liver contour, features consistent with cirrhosis. 10 x 7 mm calcified gallstone evident with mild prominence of the gallbladder wall. Insert no biliary Pancreas: No evidence for main duct dilatation. Spleen: Spleen is 13.3 cm craniocaudal length, upper normal. Adrenals/Urinary Tract: No adrenal nodule or mass. Kidneys unremarkable on noncontrast imaging. No evidence for  hydroureter. The urinary bladder appears normal for the degree of distention. Stomach/Bowel: Stomach is distended with food. Duodenum is normally positioned as is the ligament of Treitz. No small bowel wall thickening. No small bowel dilatation. The terminal ileum is normal. The appendix is not visualized, but there is no edema or inflammation in the region of the cecum. No gross colonic mass. No colonic wall thickening. Vascular/Lymphatic: There is abdominal aortic atherosclerosis without aneurysm. There is no gastrohepatic or hepatoduodenal ligament lymphadenopathy. No intraperitoneal or retroperitoneal lymphadenopathy. No pelvic sidewall lymphadenopathy. Reproductive: The uterus is unremarkable.  There is no adnexal mass. Other: Trace fluid in the region of the left paracolic gutter. Trace fluid evident in the cul-de-sac. Musculoskeletal: No worrisome lytic or sclerotic osseous abnormality. IMPRESSION: 1. No acute findings in the abdomen/pelvis. Specifically, no evidence for retroperitoneal hemorrhage or gross hemoperitoneum. 2. Cirrhotic liver morphology. 3. Cholelithiasis. Electronically Signed   By: Misty Stanley M.D.   On: 04/07/2019 19:31   Dg Abd 1 View  Result Date: 04/11/2019 CLINICAL DATA:  Severe abdominal pain.  Constipation. EXAM: ABDOMEN - 1 VIEW COMPARISON:  01/22/2019 FINDINGS: No abnormal bowel dilatation. Moderate stool burden noted within the colon. No radio-opaque calculi or other significant radiographic abnormality are seen. IMPRESSION: 1. Nonobstructive bowel gas pattern. 2. Moderate stool burden within the colon which may reflect constipation. Electronically Signed   By: Kerby Moors M.D.   On: 04/11/2019 19:22   Ct Head Wo Contrast  Result Date: 04/02/2019 CLINICAL DATA:  Pt report that she has low platelets of 12, and states it is d/t her ETOH use pt reports that she was told on her last visit here, Pt reports that she has been having increased dizziness, HA, poor appetite,  bruising easily. EXAM: CT HEAD WITHOUT CONTRAST TECHNIQUE: Contiguous axial images were obtained from the base of the skull through the vertex without intravenous contrast. COMPARISON:  02/03/2019 FINDINGS: Brain: There is no intra or extra-axial fluid collection or mass lesion. The basilar cisterns and ventricles have a normal appearance. There is no CT evidence for acute infarction or hemorrhage. Stable small calcification within the LEFT cerebellum. Vascular: No hyperdense vessel or unexpected calcification. Skull: Normal. Negative for fracture or focal lesion. Sinuses/Orbits: No acute finding. Other: None IMPRESSION: No evidence for acute abnormality. Electronically Signed   By: Nolon Nations M.D.   On: 04/02/2019 17:51   Mr Brain Wo Contrast  Result Date: 04/07/2019 CLINICAL DATA:  Ataxia.  Chronic alcohol abuse. EXAM: MRI HEAD WITHOUT CONTRAST TECHNIQUE: Multiplanar, multiecho pulse sequences of the brain and surrounding structures were obtained without intravenous contrast. COMPARISON:  Head CT 04/02/2019.  MRI 12/06/2018 FINDINGS: Brain: Brainstem is normal. Mild cerebellar atrophy without focal insult. Cerebral hemispheres show  mild generalized atrophy without focal small or large vessel infarction. As seen previously, mild T1 hyperintensity of the basal ganglia probably relates to chronic alcohol use. No sign of mass lesion, hemorrhage, hydrocephalus or extra-axial collection. Vascular: Major vessels at the base of the brain show flow. Skull and upper cervical spine: Negative Sinuses/Orbits: Clear/normal Other: None IMPRESSION: No change since the previous study. No acute or reversible finding. Brain atrophy, premature for age, probably secondary to chronic alcohol use. Mild T1 hyperintensity of the basal ganglia likely related to chronic alcohol use as seen previously. Electronically Signed   By: Nelson Chimes M.D.   On: 04/07/2019 13:42   Ct Abdomen Pelvis W Contrast  Result Date:  04/02/2019 CLINICAL DATA:  Nausea, vomiting, multiple falls EXAM: CT ABDOMEN AND PELVIS WITH CONTRAST TECHNIQUE: Multidetector CT imaging of the abdomen and pelvis was performed using the standard protocol following bolus administration of intravenous contrast. CONTRAST:  166m OMNIPAQUE IOHEXOL 300 MG/ML  SOLN COMPARISON:  02/18/2019 FINDINGS: Lower chest: Heterogeneous and ground-glass opacity of the left lung base (series 4, image 24), increased compared to prior examination dated 02/18/2019. Numerous esophageal varices (series 2, image 12). Hepatobiliary: Severe hepatic steatosis, with a somewhat coarse contour of the liver. Gallstones. No gallbladder wall thickening, or biliary dilatation. Pancreas: Coarse calcifications of the pancreatic uncinate. No pancreatic ductal dilatation or surrounding inflammatory changes. Spleen: Normal in size without significant abnormality. Adrenals/Urinary Tract: Adrenal glands are unremarkable. Kidneys are normal, without renal calculi, solid lesion, or hydronephrosis. Bladder is unremarkable. Stomach/Bowel: Stomach is within normal limits. Appendix appears normal. 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 Vascular/Lymphatic: No significant vascular findings are present. No enlarged abdominal or pelvic lymph nodes. Reproductive: Numerous bilateral ovarian and uterine varices. The left ovarian vein is enlarged, measuring 1.4 cm at the confluence with the left renal vein. Other: No abdominal wall hernia or abnormality. No abdominopelvic ascites. Musculoskeletal: No acute or significant osseous findings. IMPRESSION: 1.  No acute CT abnormality of the abdomen or pelvis. 2. Heterogeneous and ground-glass opacity of the left lung base (series 4, image 24), increased compared to prior examination dated 02/18/2019, concerning for infection or aspiration. 3.  Stigmata of cirrhosis with abdominal and pelvic  varices. 4. 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 Electronically Signed   By: AEddie CandleM.D.   On: 04/02/2019 17:46   [place]Ascites (abdomen Limited)  Result Date: 04/11/2019 CLINICAL DATA:  Abdominal distension. History of ascites requiring paracentesis. EXAM: LIMITED ABDOMEN ULTRASOUND FOR ASCITES TECHNIQUE: Limited ultrasound survey for ascites was performed in all four abdominal quadrants. COMPARISON:  CT 04/07/2019 FINDINGS: Survey interrogation throughout the abdomen shows no  ascites. IMPRESSION: No ascites.  Paracentesis deferred. Electronically Signed   By: DLucrezia EuropeM.D.   On: 04/11/2019 14:18       Subjective: No new complaints.   Discharge Exam: Vitals:   04/12/19 2109 04/13/19 0452  BP: (!) 86/57 102/75  Pulse: 71 (!) 59  Resp: 18 18  Temp:  98.3 F (36.8 C)  SpO2: 95% 97%   Vitals:   04/12/19 1342 04/12/19 2109 04/13/19 0452 04/13/19 0500  BP: 93/60 (!) 86/57 102/75   Pulse: 80 71 (!) 59   Resp:  18 18   Temp: 98.5 F (36.9 C)  98.3 F (36.8 C)   TempSrc: Oral  Oral   SpO2:  95% 97%  Weight:    52.5 kg  Height:        General: Pt is alert, awake, not in acute distress Cardiovascular: RRR, S1/S2 +, no rubs, no gallops Respiratory: CTA bilaterally, no wheezing, no rhonchi Abdominal: Soft, NT, ND, bowel sounds + Extremities: no edema, no cyanosis    The results of significant diagnostics from this hospitalization (including imaging, microbiology, ancillary and laboratory) are listed below for reference.     Microbiology: No results found for this or any previous visit (from the past 240 hour(s)).   Labs: BNP (last 3 results) No results for input(s): BNP in the last 8760 hours. Basic Metabolic Panel: Recent Labs  Lab 04/08/19 0442 04/09/19 1407 04/10/19 1140 04/12/19 0414 04/12/19 1524 04/13/19 0932  NA 136  --  136 133* 137 135  K 3.9 4.1 3.5 6.0* 3.0* 3.6   CL 109  --  102 103 104 102  CO2 21*  --  '25 24 24 24  '$ GLUCOSE 108*  --  105* 101* 117* 111*  BUN 5*  --  '9 10 9 10  '$ CREATININE 0.59  --  0.66 0.64 0.62 0.58  CALCIUM 8.2*  --  8.8* 8.4* 8.2* 8.5*   Liver Function Tests: Recent Labs  Lab 04/08/19 0442  AST 54*  ALT 30  ALKPHOS 149*  BILITOT 1.1  PROT 8.6*  ALBUMIN 2.7*   No results for input(s): LIPASE, AMYLASE in the last 168 hours. No results for input(s): AMMONIA in the last 168 hours. CBC: Recent Labs  Lab 04/09/19 0356 04/10/19 0508 04/11/19 0509 04/12/19 0414 04/13/19 0343  WBC 4.9 4.7 3.0* 2.8* 2.5*  NEUTROABS 2.7 2.9 1.4* 1.2* 1.1*  HGB 13.1 13.0 13.6 13.3 12.9  HCT 41.4 41.0 42.5 40.9 41.2  MCV 101.7* 101.0* 100.7* 100.5* 102.0*  PLT 27* 43* 53* 59* 66*   Cardiac Enzymes: No results for input(s): CKTOTAL, CKMB, CKMBINDEX, TROPONINI in the last 168 hours. BNP: Invalid input(s): POCBNP CBG: No results for input(s): GLUCAP in the last 168 hours. D-Dimer No results for input(s): DDIMER in the last 72 hours. Hgb A1c No results for input(s): HGBA1C in the last 72 hours. Lipid Profile No results for input(s): CHOL, HDL, LDLCALC, TRIG, CHOLHDL, LDLDIRECT in the last 72 hours. Thyroid function studies No results for input(s): TSH, T4TOTAL, T3FREE, THYROIDAB in the last 72 hours.  Invalid input(s): FREET3 Anemia work up No results for input(s): VITAMINB12, FOLATE, FERRITIN, TIBC, IRON, RETICCTPCT in the last 72 hours. Urinalysis    Component Value Date/Time   COLORURINE YELLOW 04/11/2019 1823   APPEARANCEUR CLOUDY (A) 04/11/2019 1823   LABSPEC 1.009 04/11/2019 1823   PHURINE 7.0 04/11/2019 1823   GLUCOSEU NEGATIVE 04/11/2019 1823   HGBUR NEGATIVE 04/11/2019 1823   BILIRUBINUR NEGATIVE 04/11/2019 1823   BILIRUBINUR negative 10/23/2018 1302   BILIRUBINUR neg 02/20/2018 1248   KETONESUR NEGATIVE 04/11/2019 1823   PROTEINUR NEGATIVE 04/11/2019 1823   UROBILINOGEN 1.0 10/23/2018 1302   UROBILINOGEN 0.2  12/02/2017 0836   NITRITE NEGATIVE 04/11/2019 1823   LEUKOCYTESUR NEGATIVE 04/11/2019 1823   Sepsis Labs Invalid input(s): PROCALCITONIN,  WBC,  LACTICIDVEN Microbiology No results found for this or any previous visit (from the past 240 hour(s)).   Time coordinating discharge: 32 minutes  SIGNED:   Hosie Poisson, MD  [facility] 04/14/2019, 10:42 AM Pager   If 7PM-7AM, please contact night-coverage [email or web address] Password [email or web address]

## 2019-04-19 ENCOUNTER — Telehealth: Payer: Self-pay

## 2019-04-19 ENCOUNTER — Other Ambulatory Visit: Payer: Self-pay

## 2019-04-19 NOTE — Telephone Encounter (Signed)
Patient needs another referral put into infectious Disease for Hep C

## 2019-04-20 ENCOUNTER — Other Ambulatory Visit: Payer: Self-pay | Admitting: Family Medicine

## 2019-04-20 DIAGNOSIS — B1920 Unspecified viral hepatitis C without hepatic coma: Secondary | ICD-10-CM

## 2019-04-20 NOTE — Telephone Encounter (Signed)
Patient notified

## 2019-04-22 ENCOUNTER — Other Ambulatory Visit: Payer: Self-pay

## 2019-04-22 ENCOUNTER — Telehealth: Payer: Self-pay | Admitting: Pharmacy Technician

## 2019-04-22 ENCOUNTER — Encounter: Payer: Self-pay | Admitting: Family

## 2019-04-22 ENCOUNTER — Ambulatory Visit (INDEPENDENT_AMBULATORY_CARE_PROVIDER_SITE_OTHER): Payer: Medicaid Other | Admitting: Family

## 2019-04-22 VITALS — BP 115/74 | HR 83

## 2019-04-22 DIAGNOSIS — K7031 Alcoholic cirrhosis of liver with ascites: Secondary | ICD-10-CM | POA: Diagnosis not present

## 2019-04-22 DIAGNOSIS — F1910 Other psychoactive substance abuse, uncomplicated: Secondary | ICD-10-CM

## 2019-04-22 DIAGNOSIS — K7460 Unspecified cirrhosis of liver: Secondary | ICD-10-CM

## 2019-04-22 DIAGNOSIS — B1710 Acute hepatitis C without hepatic coma: Secondary | ICD-10-CM | POA: Diagnosis not present

## 2019-04-22 DIAGNOSIS — K7290 Hepatic failure, unspecified without coma: Secondary | ICD-10-CM | POA: Diagnosis not present

## 2019-04-22 NOTE — Assessment & Plan Note (Signed)
Briana Sweeney has approximately 8-year history of cirrhosis of her liver likely multifactorial including hepatitis C and alcoholism.  Status appears to be decompensated with most recent platelet count of 66 and during hospitalization in the 20s.  She has required paracentesis for fluid drainage.  Not currently in care with liver specialist or gastroenterology.  Discussed need for continued care and monitoring.  Will refer to gastroenterology for further assessment and treatment.

## 2019-04-22 NOTE — Assessment & Plan Note (Signed)
Briana Sweeney appears to have continued polysubstance abuse as she appears to be intoxicated today despite saying she is not had any recreational or illicit drugs or alcohol in the last week.  Encouraged cessation with resources provided.

## 2019-04-22 NOTE — Telephone Encounter (Signed)
[facility] Patient Advocate Encounter    Findings of the benefits investigation conducted this morning via test claims for the patient's upcoming appointment on 04/22/2019 are as follows:   Insurance: [employer]- active and stating other primary insurance exist. Will need to find out or have the patient call her caseworker to have that removed if there is no other coverage active.   Estimated copay amount: $3.00  Prior Authorization: will begin insurance process once medication is prescribed. Will have patient sign readiness form at the time of their appointment.

## 2019-04-22 NOTE — Telephone Encounter (Signed)
Patient filled out and signed the readiness form for Medicaid. She will contact her caseworker while awaiting labs to have the other insurance coverage removed from her nctracks profile. She confirmed that she only has [place] Medicaid as insurance.

## 2019-04-22 NOTE — Progress Notes (Signed)
Subjective:    Patient ID: Briana Sweeney, female    DOB: Aug 27, 1971, 48 y.o.   MRN: [id number]  Chief Complaint  Patient presents with  . Follow-up     HPI:  Briana Sweeney is a 48 y.o. female with previous medical history including schizophrenia, heroin abuse, hepatitis C, alcoholism, and cirrhosis of the liver who was previously seen in our office on 12/16/2017 for treatment of hepatitis C and found to have no viral load present and likely being cleared independent of treatment.  Briana Sweeney was recently hospitalized with thrombocytopenia and cirrhosis and found to have positive hepatitis C serology with a viral load of 49,500, genotype 1a, and abdominal imaging with CT scan showing cirrhosis.  She has previously been treated in [place] for cirrhosis.  All hospital records, labs, and imaging reviewed in detail.  Briana Sweeney was initially diagnosed with cirrhosis approximately 8 years ago and with hepatitis C about 2 years ago.  Previously a very heavy drinker of alcohol with most recent being 4 days ago consuming 4 beers down from 12 beers per day on average.  Denies any current recreational or illicit drug use.  She is not currently in care for her cirrhosis.  She has never been treated for hepatitis C.  Currently insured through [facility].     Allergies  Allergen Reactions  . Haldol [Haloperidol] Other (See Comments)    Made her feel bad      Outpatient Medications Prior to Visit  Medication Sig Dispense Refill  . alum & mag hydroxide-simeth (MAALOX/MYLANTA) 200-200-20 MG/5ML suspension Take 15 mLs by mouth every 4 (four) hours as needed for indigestion or heartburn. 355 mL 0  . FLUoxetine (PROZAC) 40 MG capsule Take 1 capsule (40 mg total) by mouth daily. 30 capsule 3  . folic acid (FOLVITE) 1 MG tablet Take 1 tablet (1 mg total) by mouth daily. 30 tablet 3  . furosemide (LASIX) 40 MG tablet Take 1 tablet (40 mg total) by mouth daily. 60 tablet 3  . gabapentin (NEURONTIN) 300 MG  capsule Take 2 capsules 2 times a day. 120 capsule 3  . hydrOXYzine (ATARAX/VISTARIL) 25 MG tablet Take 1 tablet (25 mg total) by mouth every 6 (six) hours as needed for itching. 20 tablet 0  . lactulose (CHRONULAC) 10 GM/15ML solution Take 30 mLs (20 g total) by mouth 3 (three) times daily. Goal to have 2-3 BMs daily 240 mL 3  . Multiple Vitamin (MULTIVITAMIN WITH MINERALS) TABS tablet Take 1 tablet by mouth daily.    . pantoprazole (PROTONIX) 40 MG tablet Take 1 tablet (40 mg total) by mouth 2 (two) times daily. 60 tablet 2  . phenytoin (DILANTIN) 300 MG ER capsule Take 1 capsule (300 mg total) by mouth daily. 30 capsule 3  . polyethylene glycol (MIRALAX / GLYCOLAX) 17 g packet Take 17 g by mouth 2 (two) times daily. 14 each 0  . thiamine 100 MG tablet Take 1 tablet (100 mg total) by mouth daily. 30 tablet 0  . topiramate (TOPAMAX) 50 MG tablet Take 1 tablet (50 mg total) by mouth daily. 30 tablet 2  . traZODone (DESYREL) 50 MG tablet Take 2 tablets (100 mg total) by mouth at bedtime. 10 tablet 3   Facility-Administered Medications Prior to Visit  Medication Dose Route Frequency Provider Last Rate Last Dose  . 0.9 %  sodium chloride infusion (Manually program via Guardrails IV Fluids)   Intravenous Once [name], FNP  Past Medical History:  Diagnosis Date  . Anxiety   . Bipolar affective disorder ([place])    With anxiety features  . Cirrhosis of liver ([place])    Due to alcohol and hepatitis C  . Depression   . 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 ETOHism ([place])   . GERD (gastroesophageal reflux disease)   . Hematemesis 02/10/2018  . Hepatitis C 2018   hepatitis c and alcohol related hepatitis  . Heroin abuse ([place])   . History of blood transfusion    "blood doesn't clot; I fell down and had to have a transfusion"  . History of kidney stones   . Migraine    "when I get really stressed" (09/01/2017)  . Schizophrenia ([place])   . Seizures ([place])    "when I run out of  my RX; lots recently" (09/01/2017)     Past Surgical History:  Procedure Laterality Date  . ESOPHAGOGASTRODUODENOSCOPY N/A 09/03/2017   Procedure: ESOPHAGOGASTRODUODENOSCOPY (EGD);  Surgeon: Doran Stabler, MD;  Location: [facility];  Service: Gastroenterology;  Laterality: N/A;  . FINGER FRACTURE SURGERY Left    "shattered my pinky"  . FRACTURE SURGERY    . I&D EXTREMITY Left 09/18/2018   Procedure: IRRIGATION AND DEBRIDEMENT EXTREMITY;  Surgeon: Leanora Cover, MD;  Location: [facility];  Service: Orthopedics;  Laterality: Left;  . IR PARACENTESIS  07/23/2017  . IR PARACENTESIS  07/2017   "did it twice in the same week" (09/01/2017)  . SHOULDER OPEN ROTATOR CUFF REPAIR Right   . TUBAL LIGATION    . VAGINAL HYSTERECTOMY         Review of Systems  Constitutional: Negative for chills, fatigue, fever and unexpected weight change.  Respiratory: Negative for cough, chest tightness, shortness of breath and wheezing.   Cardiovascular: Negative for chest pain and leg swelling.  Gastrointestinal: Negative for abdominal distention, constipation, diarrhea, nausea and vomiting.  Neurological: Negative for dizziness, weakness, light-headedness and headaches.  Hematological: Does not bruise/bleed easily.      Objective:    BP 115/74   Pulse 83  Nursing note and vital signs reviewed.  Physical Exam Constitutional:      General: She is not in acute distress.    Appearance: She is well-developed. She is ill-appearing.     Comments: Seated in chair; pleasant; appears older than stated age  Cardiovascular:     Rate and Rhythm: Normal rate and regular rhythm.     Heart sounds: Normal heart sounds. No murmur. No friction rub. No gallop.   Pulmonary:     Effort: Pulmonary effort is normal. No respiratory distress.     Breath sounds: Normal breath sounds. No wheezing or rales.  Chest:     Chest wall: No tenderness.  Abdominal:     General: Bowel sounds are normal. There is no  distension.     Palpations: Abdomen is soft. There is no mass.     Tenderness: There is no abdominal tenderness. There is no guarding or rebound.  Skin:    General: Skin is warm and dry.  Neurological:     Mental Status: She is alert and oriented to person, place, and time.  Psychiatric:        Behavior: Behavior normal.        Thought Content: Thought content normal.        Judgment: Judgment normal.      Depression screen [facility] 2/9 04/22/2019 02/19/2019 09/29/2018 12/16/2017 10/21/2017  Decreased Interest 0 0 0 0  0  Down, Depressed, Hopeless 0 0 0 1 0  PHQ - 2 Score 0 0 0 1 0  Some encounter information is confidential and restricted. Go to Review Flowsheets activity to see all data.       Assessment & Plan:   Problem List Items Addressed This Visit      Digestive   Decompensated hepatic cirrhosis [facility])    Briana Sweeney has approximately 8-year history of cirrhosis of her liver likely multifactorial including hepatitis C and alcoholism.  Status appears to be decompensated with most recent platelet count of 66 and during hospitalization in the 20s.  She has required paracentesis for fluid drainage.  Not currently in care with liver specialist or gastroenterology.  Discussed need for continued care and monitoring.  Will refer to gastroenterology for further assessment and treatment.      Relevant Orders   CBC   COMPLETE METABOLIC PANEL WITH GFR   Ambulatory referral to Gastroenterology   Alcoholic cirrhosis of liver with ascites ([place]) - Primary   Relevant Orders   CBC   COMPLETE METABOLIC PANEL WITH GFR   Hepatitis C    Briana Sweeney has hepatitis C with a genotype 1a and viral load of 49,500.  This is likely a new infection given her previous blood work on 12/16/2017 with no viral load present and now with a viral load present.  Risk factors include polysubstance abuse.  Discussed the pathogenesis, transmission, prevention, risks if left untreated, and treatment options for hepatitis C.   This is complicated by her decompensated cirrhosis currently.  Recheck CBC and CMP today.  Planning on referring to gastroenterology for further evaluation.  Plan for treatment with Epclusa and ribavirin.  Pending blood work results may consider low-dose Ribavarin and increasing during treatment.       Relevant Orders   CBC   COMPLETE METABOLIC PANEL WITH GFR     Other   Polysubstance abuse [facility])    Briana Sweeney appears to have continued polysubstance abuse as she appears to be intoxicated today despite saying she is not had any recreational or illicit drugs or alcohol in the last week.  Encouraged cessation with resources provided.      Relevant Orders   Ambulatory referral to Gastroenterology       I am having Manfred Shirts maintain her alum & mag hydroxide-simeth, FLUoxetine, folic acid, gabapentin, hydrOXYzine, lactulose, multivitamin with minerals, pantoprazole, phenytoin, polyethylene glycol, thiamine, topiramate, traZODone, and furosemide. We will continue to administer sodium chloride.    Follow-up: Return in about 1 month (around 05/23/2019), or if symptoms worsen or fail to improve.   Terri Piedra, MSN, FNP-C Nurse Practitioner [facility] for Infectious [facility] [place] number: [phone]

## 2019-04-22 NOTE — Patient Instructions (Signed)
Nice to see you.  We will check your blood work today.  We will get you referred to gastroenterology.  Limit acetaminophen (Tylenol) usage to no more than 2 grams (2,000 mg) per day.  Avoid alcohol.  Do not share toothbrushes or razors.  Practice safe sex to protect against transmission as well as sexually transmitted disease.    Hepatitis C Hepatitis C is a viral infection of the liver. It can lead to scarring of the liver (cirrhosis), liver failure, or liver cancer. Hepatitis C may go undetected for months or years because people with the infection may not have symptoms, or they may have only mild symptoms. What are the causes? This condition is caused by the hepatitis C virus (HCV). The virus can spread from person to person (is contagious) through:  Blood.  Childbirth. A woman who has hepatitis C can pass it to her baby during birth.  Bodily fluids, such as breast milk, tears, semen, vaginal fluids, and saliva.  Blood transfusions or organ transplants done in the [place] before 1992.  What increases the risk? The following factors may make you more likely to develop this condition:  Having contact with unclean (contaminated) needles or syringes. This may result from: ? Acupuncture. ? Tattoing. ? Body piercing. ? Injecting drugs.  Having unprotected sex with someone who is infected.  Needing treatment to filter your blood (kidney dialysis).  Having HIV (human immunodeficiency virus) or AIDS (acquired immunodeficiency syndrome).  Working in a job that involves contact with blood or bodily fluids, such as health care.  What are the signs or symptoms? Symptoms of this condition include:  Fatigue.  Loss of appetite.  Nausea.  Vomiting.  Abdominal pain.  Dark yellow urine.  Yellowish skin and eyes (jaundice).  Itchy skin.  Clay-colored bowel movements.  Joint pain.  Bleeding and bruising easily.  Fluid building up in your stomach (ascites).   In some cases, you may not have any symptoms. How is this diagnosed? This condition is diagnosed with:  Blood tests.  Other tests to check how well your liver is functioning. They may include: ? Magnetic resonance elastography (MRE). This imaging test uses MRIs and sound waves to measure liver stiffness. ? Transient elastography. This imaging test uses ultrasounds to measure liver stiffness. ? Liver biopsy. This test requires taking a small tissue sample from your liver to examine it under a microscope.  How is this treated? Your health care provider may perform noninvasive tests or a liver biopsy to help decide the best course of treatment. Treatment may include:  Antiviral medicines and other medicines.  Follow-up treatments every 6-12 months for infections or other liver conditions.  Receiving a donated liver (liver transplant).  Follow these instructions at home: Medicines  Take over-the-counter and prescription medicines only as told by your health care provider.  Take your antiviral medicine as told by your health care provider. Do not stop taking the antiviral even if you start to feel better.  Do not take any medicines unless approved by your health care provider, including over-the-counter medicines and birth control pills. Activity  Rest as needed.  Do not have sex unless approved by your health care provider.  Ask your health care provider when you may return to school or work. Eating and drinking  Eat a balanced diet with plenty of fruits and vegetables, whole grains, and lowfat (lean) meats or non-meat proteins (such as beans or tofu).  Drink enough fluids to keep your urine clear or pale  yellow.  Do not drink alcohol. General instructions  Do not share toothbrushes, nail clippers, or razors.  Wash your hands frequently with soap and water. If soap and water are not available, use hand sanitizer.  Cover any cuts or open sores on your skin to prevent  spreading the virus.  Keep all follow-up visits as told by your health care provider. This is important. You may need follow-up visits every 6-12 months. How is this prevented? There is no vaccine for hepatitis C. The only way to prevent the disease is to reduce the risk of exposure to the virus. Make sure you:  Wash your hands frequently with soap and water. If soap and water are not available, use hand sanitizer.  Do not share needles or syringes.  Practice safe sex and use condoms.  Avoid handling blood or bodily fluids without gloves or other protection.  Avoid getting tattoos or piercings in shops or other locations that are not clean.  Contact a health care provider if:  You have a fever.  You develop abdominal pain.  You pass dark urine.  You pass clay-colored stools.  You develop joint pain. Get help right away if:  You have increasing fatigue or weakness.  You lose your appetite.  You cannot eat or drink without vomiting.  You develop jaundice or your jaundice gets worse.  You bruise or bleed easily. Summary  Hepatitis C is a viral infection of the liver. It can lead to scarring of the liver (cirrhosis), liver failure, or liver cancer.  The hepatitis C virus (HCV) causes this condition. The virus can pass from person to person (is contagious).  You should not take any medicines unless approved by your health care provider. This includes over-the-counter medicines and birth control pills. This information is not intended to replace advice given to you by your health care provider. Make sure you discuss any questions you have with your health care provider. Document Released: 08/23/2000 Document Revised: 10/01/2016 Document Reviewed: 10/01/2016 Elsevier Interactive Patient Education  [employer].

## 2019-04-22 NOTE — Assessment & Plan Note (Signed)
Briana Sweeney has hepatitis C with a genotype 1a and viral load of 49,500.  This is likely a new infection given her previous blood work on 12/16/2017 with no viral load present and now with a viral load present.  Risk factors include polysubstance abuse.  Discussed the pathogenesis, transmission, prevention, risks if left untreated, and treatment options for hepatitis C.  This is complicated by her decompensated cirrhosis currently.  Recheck CBC and CMP today.  Planning on referring to gastroenterology for further evaluation.  Plan for treatment with Epclusa and ribavirin.  Pending blood work results may consider low-dose Ribavarin and increasing during treatment.

## 2019-04-23 LAB — COMPLETE METABOLIC PANEL WITH GFR
AG Ratio: 0.7 (calc) — ABNORMAL LOW (ref 1.0–2.5)
ALT: 18 U/L (ref 6–29)
AST: 38 U/L — ABNORMAL HIGH (ref 10–35)
Albumin: 3.5 g/dL — ABNORMAL LOW (ref 3.6–5.1)
Alkaline phosphatase (APISO): 147 U/L — ABNORMAL HIGH (ref 31–125)
BUN: 7 mg/dL (ref 7–25)
CO2: 20 mmol/L (ref 20–32)
Calcium: 8.8 mg/dL (ref 8.6–10.2)
Chloride: 102 mmol/L (ref 98–110)
Creat: 0.52 mg/dL (ref 0.50–1.10)
GFR, Est African American: 133 mL/min/{1.73_m2} (ref >=60)
GFR, Est Non African American: 115 mL/min/{1.73_m2} (ref >=60)
Globulin: 5.1 g/dL (calc) — ABNORMAL HIGH (ref 1.9–3.7)
Glucose, Bld: 89 mg/dL (ref 65–99)
Potassium: 3.4 mmol/L — ABNORMAL LOW (ref 3.5–5.3)
Sodium: 135 mmol/L (ref 135–146)
Total Bilirubin: 1.2 mg/dL (ref 0.2–1.2)
Total Protein: 8.6 g/dL — ABNORMAL HIGH (ref 6.1–8.1)

## 2019-04-23 LAB — CBC
HCT: 41.4 % (ref 35.0–45.0)
Hemoglobin: 13.7 g/dL (ref 11.7–15.5)
MCH: 31.5 pg (ref 27.0–33.0)
MCHC: 33.1 g/dL (ref 32.0–36.0)
MCV: 95.2 fL (ref 80.0–100.0)
MPV: 11.4 fL (ref 7.5–12.5)
Platelets: 66 10*3/uL — ABNORMAL LOW (ref 140–400)
RBC: 4.35 10*6/uL (ref 3.80–5.10)
RDW: 15.8 % — ABNORMAL HIGH (ref 11.0–15.0)
WBC: 2.1 10*3/uL — ABNORMAL LOW (ref 3.8–10.8)

## 2019-05-03 ENCOUNTER — Ambulatory Visit: Payer: Self-pay | Admitting: Family Medicine

## 2019-05-05 ENCOUNTER — Encounter: Payer: Self-pay | Admitting: Family Medicine

## 2019-05-05 ENCOUNTER — Other Ambulatory Visit: Payer: Self-pay

## 2019-05-05 ENCOUNTER — Telehealth: Payer: Self-pay | Admitting: *Deleted

## 2019-05-05 ENCOUNTER — Ambulatory Visit (INDEPENDENT_AMBULATORY_CARE_PROVIDER_SITE_OTHER): Payer: Medicaid Other | Admitting: Family Medicine

## 2019-05-05 VITALS — BP 102/82 | HR 75 | Temp 98.0°F | Ht 60.0 in | Wt 125.0 lb

## 2019-05-05 DIAGNOSIS — D696 Thrombocytopenia, unspecified: Secondary | ICD-10-CM

## 2019-05-05 DIAGNOSIS — Z0000 Encounter for general adult medical examination without abnormal findings: Secondary | ICD-10-CM

## 2019-05-05 DIAGNOSIS — R42 Dizziness and giddiness: Secondary | ICD-10-CM

## 2019-05-05 DIAGNOSIS — Z09 Encounter for follow-up examination after completed treatment for conditions other than malignant neoplasm: Secondary | ICD-10-CM

## 2019-05-05 DIAGNOSIS — F1019 Alcohol abuse with unspecified alcohol-induced disorder: Secondary | ICD-10-CM

## 2019-05-05 DIAGNOSIS — Z23 Encounter for immunization: Secondary | ICD-10-CM

## 2019-05-05 DIAGNOSIS — G4700 Insomnia, unspecified: Secondary | ICD-10-CM | POA: Insufficient documentation

## 2019-05-05 DIAGNOSIS — F419 Anxiety disorder, unspecified: Secondary | ICD-10-CM | POA: Diagnosis not present

## 2019-05-05 DIAGNOSIS — B1920 Unspecified viral hepatitis C without hepatic coma: Secondary | ICD-10-CM

## 2019-05-05 DIAGNOSIS — Z1239 Encounter for other screening for malignant neoplasm of breast: Secondary | ICD-10-CM

## 2019-05-05 DIAGNOSIS — R569 Unspecified convulsions: Secondary | ICD-10-CM

## 2019-05-05 NOTE — Telephone Encounter (Signed)
Referring office called to find out what the next step for the patient is. The patient was in their clinic today and could not speak to the process. Advised would have pharmacy give her a call as it looks like we were waiting on her to finalize Medicaid app.

## 2019-05-05 NOTE — Telephone Encounter (Signed)
I spoke with the patient today prior to her appointment with the patient care center and explained where we are in the process for her HepC treatment. She understood next steps and that we would reach out to her. The hold up with [facility] Medicaid insurance billing has been resolved.

## 2019-05-05 NOTE — Progress Notes (Signed)
Patient [facility]   Established Patient Office Visit  Subjective:  Patient ID: Briana Sweeney, female    DOB: Jun 05, 1971  Age: 48 y.o. MRN: [id number]  CC:  Chief Complaint  Patient presents with  . Hospitalization Follow-up    follow up from hospital visit 2 weeks ago , low platelet count     HPI Briana Sweeney is a 48 year old female who presents for Hospital Follow Up today.   Past Medical History:  Diagnosis Date  . Anxiety   . Bipolar affective disorder ([place])    With anxiety features  . 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 Depression   . 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 ETOHism ([place])   . GERD (gastroesophageal reflux disease)   . Hematemesis 02/10/2018  . Hepatitis C 2018   hepatitis c and alcohol related hepatitis  . Heroin abuse ([place])   . History of blood transfusion    "blood doesn't clot; I fell down and had to have a transfusion"  . History of kidney stones   . Menopause 2016  . Migraine    "when I get really stressed" (09/01/2017)  . Schizophrenia ([place])   . Seizures ([place])    "when I run out of my RX; lots recently" (09/01/2017)   Current Status: Since her last office visit, she has had multiple ED visits, lastly on 04/02/2019 for Thrombocytopenia. She is doing well with no complaints. Her last Platelet Infusion was on 04/14/2019. She have been taking Vitamins as needed. She denies easy bruising, petechiae, gingival bleeding, epistaxis, profuse bleeding from superficial cuts, menorrhagia, mentrorrhagia, hematochezia, and hematuria. She last has an alcoholic beverage 2 weeks ago. She denies fevers, chills, fatigue, recent infections, weight loss, and night sweats. She has not had any headaches, visual changes, dizziness, and falls. No chest pain, heart palpitations, cough and shortness of breath reported. No reports of GI problems such as nausea, vomiting, diarrhea, and constipation. She has no  reports of blood in stools, dysuria and hematuria. Her anxiety is mild today. She denies suicidal ideations, homicidal ideations, or auditory hallucinations. She denies pain today.   Past Surgical History:  Procedure Laterality Date  . ESOPHAGOGASTRODUODENOSCOPY N/A 09/03/2017   Procedure: ESOPHAGOGASTRODUODENOSCOPY (EGD);  Surgeon: Doran Stabler, MD;  Location: [facility];  Service: Gastroenterology;  Laterality: N/A;  . FINGER FRACTURE SURGERY Left    "shattered my pinky"  . FRACTURE SURGERY    . I&D EXTREMITY Left 09/18/2018   Procedure: IRRIGATION AND DEBRIDEMENT EXTREMITY;  Surgeon: Leanora Cover, MD;  Location: [facility];  Service: Orthopedics;  Laterality: Left;  . IR PARACENTESIS  07/23/2017  . IR PARACENTESIS  07/2017   "did it twice in the same week" (09/01/2017)  . SHOULDER OPEN ROTATOR CUFF REPAIR Right   . TUBAL LIGATION    . VAGINAL HYSTERECTOMY      Family History  Problem Relation Age of Onset  . Lung cancer Mother 38  . Alcohol abuse Mother   . Throat cancer Father 7    Social History   Socioeconomic History  . Marital status: Divorced    Spouse name: Not on file  . Number of children: Not on file  . Years of education: Not on file  . Highest education level: Not on file  Occupational History  . Occupation: applying for disability  Social Needs  . Financial resource strain: Not on file  .  Food insecurity    Worry: Not on file    Inability: Not on file  . Transportation needs    Medical: Not on file    Non-medical: Not on file  Tobacco Use  . Smoking status: Never Smoker  . Smokeless tobacco: Never Used  Substance and Sexual Activity  . Alcohol use: Yes    Alcohol/week: 63.0 standard drinks    Types: 63 Cans of beer per week    Comment: weekly "I have cut back"; 4 days 4 vs 12 pack   . Drug use: Yes    Types: Marijuana, Cocaine    Comment: 2 weeks;   . Sexual activity: Not Currently  Lifestyle  . Physical activity    Days per week: Not on  file    Minutes per session: Not on file  . Stress: Not on file  Relationships  . Social Herbalist on phone: Not on file    Gets together: Not on file    Attends religious service: Not on file    Active member of club or organization: Not on file    Attends meetings of clubs or organizations: Not on file    Relationship status: Not on file  . Intimate partner violence    Fear of current or ex partner: Not on file    Emotionally abused: Not on file    Physically abused: Not on file    Forced sexual activity: Not on file  Other Topics Concern  . Not on file  Social History Narrative   She moved with a boyfriend to [place] and was followed at [facility].  He died of a massive heart attack in 8/18, per her report, and so she moved back to [place] and is living with a friend.    Outpatient Medications Prior to Visit  Medication Sig Dispense Refill  . alum & mag hydroxide-simeth (MAALOX/MYLANTA) 200-200-20 MG/5ML suspension Take 15 mLs by mouth every 4 (four) hours as needed for indigestion or heartburn. 355 mL 0  . FLUoxetine (PROZAC) 40 MG capsule Take 1 capsule (40 mg total) by mouth daily. 30 capsule 3  . folic acid (FOLVITE) 1 MG tablet Take 1 tablet (1 mg total) by mouth daily. 30 tablet 3  . furosemide (LASIX) 40 MG tablet Take 1 tablet (40 mg total) by mouth daily. 60 tablet 3  . gabapentin (NEURONTIN) 300 MG capsule Take 2 capsules 2 times a day. 120 capsule 3  . hydrOXYzine (ATARAX/VISTARIL) 25 MG tablet Take 1 tablet (25 mg total) by mouth every 6 (six) hours as needed for itching. 20 tablet 0  . lactulose (CHRONULAC) 10 GM/15ML solution Take 30 mLs (20 g total) by mouth 3 (three) times daily. Goal to have 2-3 BMs daily 240 mL 3  . Multiple Vitamin (MULTIVITAMIN WITH MINERALS) TABS tablet Take 1 tablet by mouth daily.    . pantoprazole (PROTONIX) 40 MG tablet Take 1 tablet (40 mg total) by mouth 2 (two) times daily. 60 tablet 2  . phenytoin (DILANTIN) 300  MG ER capsule Take 1 capsule (300 mg total) by mouth daily. 30 capsule 3  . polyethylene glycol (MIRALAX / GLYCOLAX) 17 g packet Take 17 g by mouth 2 (two) times daily. 14 each 0  . thiamine 100 MG tablet Take 1 tablet (100 mg total) by mouth daily. 30 tablet 0  . topiramate (TOPAMAX) 50 MG tablet Take 1 tablet (50 mg total) by mouth daily. 30 tablet 2  . traZODone (  DESYREL) 50 MG tablet Take 2 tablets (100 mg total) by mouth at bedtime. 10 tablet 3   Facility-Administered Medications Prior to Visit  Medication Dose Route Frequency Provider Last Rate Last Dose  . 0.9 %  sodium chloride infusion (Manually program via Guardrails IV Fluids)   Intravenous Once [name], FNP        Allergies  Allergen Reactions  . Haldol [Haloperidol] Other (See Comments)    Made her feel bad    ROS Review of Systems  Constitutional: Negative.   HENT: Negative.   Eyes: Negative.   Respiratory: Negative.   Cardiovascular: Negative.   Gastrointestinal: Negative.   Endocrine: Negative.   Genitourinary: Negative.   Musculoskeletal: Negative.   Skin: Negative.   Allergic/Immunologic: Negative.   Neurological: Negative.   Hematological: Bruises/bleeds easily (thrombocytopenia).  Psychiatric/Behavioral: Negative.    Objective:    Physical Exam  Constitutional: She is oriented to person, place, and time. She appears well-developed and well-nourished.  HENT:  Head: Normocephalic and atraumatic.  Eyes: Conjunctivae are normal.  Neck: Normal range of motion. Neck supple.  Cardiovascular: Normal rate, regular rhythm, normal heart sounds and intact distal pulses.  Pulmonary/Chest: Effort normal and breath sounds normal.  Abdominal: Soft. Bowel sounds are normal.  Musculoskeletal: Normal range of motion.  Neurological: She is alert and oriented to person, place, and time. She has normal reflexes.  Skin: Skin is warm and dry.  Psychiatric: She has a normal mood and affect. Her behavior is normal.  Judgment and thought content normal.  Nursing note reviewed.   BP 102/82 (BP Location: Right Arm, Patient Position: Sitting, Cuff Size: Normal)   Pulse 75   Temp 98 F (36.7 C) (Oral)   Ht 5' (1.524 m)   Wt 125 lb (56.7 kg)   BMI 24.41 kg/m  Wt Readings from Last 3 Encounters:  05/05/19 125 lb (56.7 kg)  04/13/19 115 lb 11.9 oz (52.5 kg)  02/19/19 130 lb (59 kg)     Health Maintenance Due  Topic Date Due  . INFLUENZA VACCINE  04/10/2019    There are no preventive care reminders to display for this patient.  Lab Results  Component Value Date   TSH 1.480 05/20/2018   Lab Results  Component Value Date   WBC 2.1 (L) 04/22/2019   HGB 13.7 04/22/2019   HCT 41.4 04/22/2019   MCV 95.2 04/22/2019   PLT 66 (L) 04/22/2019   Lab Results  Component Value Date   NA 135 04/22/2019   K 3.4 (L) 04/22/2019   CO2 20 04/22/2019   GLUCOSE 89 04/22/2019   BUN 7 04/22/2019   CREATININE 0.52 04/22/2019   BILITOT 1.2 04/22/2019   ALKPHOS 149 (H) 04/08/2019   AST 38 (H) 04/22/2019   ALT 18 04/22/2019   PROT 8.6 (H) 04/22/2019   ALBUMIN 2.7 (L) 04/08/2019   CALCIUM 8.8 04/22/2019   ANIONGAP 9 04/13/2019   Lab Results  Component Value Date   CHOL 164 05/20/2018   Lab Results  Component Value Date   HDL 71 05/20/2018   Lab Results  Component Value Date   LDLCALC 63 05/20/2018   Lab Results  Component Value Date   TRIG 149 05/20/2018   Lab Results  Component Value Date   CHOLHDL 2.3 05/20/2018   Lab Results  Component Value Date   HGBA1C 4.9 10/21/2017      Assessment & Plan:   1. Hospital discharge follow-up  2. Hepatitis C virus infection without hepatic  coma, unspecified chronicity Insurance issues. We will contact Infection Disease today for follow up appointment.   3. Thrombocytopenia ([place]) Most receHer most recent platelet count was at 66 on 02/16/2018. She has bruising has multiple bruises over her today. She continues to deny petechiae, gingival  bleeding, epistaxis, profuse bleeding from superficial cuts, melena, menorrhagia, mentrorrhagia, hematochezia, and hematuria. She states that her appetite is good. Denies abdominal pain, nausea, vomiting, diarrhea, and constipation. We will re-assess today.   4. Anxiety - Ambulatory referral to Psychiatry  5. Insomnia, unspecified type  6. Alcohol abuse with alcohol-induced disorder ([place]) She is currently 2 weeks sober. She will continue vitamins daily.   7. Seizure ([place]) Well controlled.   8. Dizziness Stable today.   9. Breast cancer screening - MM Digital Diagnostic Bilat; Future  10. Healthcare maintenance - CBC with Differential - Comprehensive metabolic panel - Lipid Panel - TSH - Vitamin B12 - Vitamin D, 25-hydroxy  11. Influenza vaccine administered - Flu Vaccine QUAD 6+ mos PF IM (Fluarix Quad PF)  12. Follow up She will follow up in 3 months.     No orders of the defined types were placed in this encounter.   Orders Placed This Encounter  Procedures  . MM Digital Diagnostic Bilat  . Flu Vaccine QUAD 6+ mos PF IM (Fluarix Quad PF)  . CBC with Differential  . Comprehensive metabolic panel  . Lipid Panel  . TSH  . Vitamin B12  . Vitamin D, 25-hydroxy  . Ambulatory referral to Psychiatry      Referral Orders     Ambulatory referral to Psychiatry     Kathe Becton,  MSN, FNP-BC [facility] [address] [place], [place] [address] [phone] fax  Problem List Items Addressed This Visit      Digestive   Hepatitis C     Other   Anxiety   Relevant Orders   Ambulatory referral to Psychiatry   Dizziness   Seizure ([employer])   Thrombocytopenia ([place])    Other Visit Diagnoses    Hospital discharge follow-up    -  Primary   Insomnia, unspecified type       Alcohol abuse with alcohol-induced disorder ([place])       Breast cancer screening       Relevant Orders    MM Digital Diagnostic Bilat   Healthcare maintenance       Relevant Orders   CBC with Differential   Comprehensive metabolic panel   Lipid Panel   TSH   Vitamin B12   Vitamin D, 25-hydroxy   Influenza vaccine administered       Relevant Orders   Flu Vaccine QUAD 6+ mos PF IM (Fluarix Quad PF)   Follow up          No orders of the defined types were placed in this encounter.   Follow-up: Return in about 3 months (around 08/05/2019).    [name], FNP

## 2019-05-06 ENCOUNTER — Other Ambulatory Visit: Payer: Self-pay

## 2019-05-06 ENCOUNTER — Emergency Department (HOSPITAL_COMMUNITY)
Admission: EM | Admit: 2019-05-06 | Discharge: 2019-05-06 | Disposition: A | Discharge status: Home / Self Care | Payer: Medicaid Other | Attending: Emergency Medicine | Admitting: Emergency Medicine

## 2019-05-06 ENCOUNTER — Encounter (HOSPITAL_COMMUNITY): Payer: Self-pay

## 2019-05-06 ENCOUNTER — Emergency Department (HOSPITAL_COMMUNITY): Payer: Medicaid Other

## 2019-05-06 ENCOUNTER — Other Ambulatory Visit: Payer: Self-pay | Admitting: Family Medicine

## 2019-05-06 DIAGNOSIS — Y999 Unspecified external cause status: Secondary | ICD-10-CM | POA: Diagnosis not present

## 2019-05-06 DIAGNOSIS — F250 Schizoaffective disorder, bipolar type: Secondary | ICD-10-CM | POA: Insufficient documentation

## 2019-05-06 DIAGNOSIS — Y929 Unspecified place or not applicable: Secondary | ICD-10-CM | POA: Diagnosis not present

## 2019-05-06 DIAGNOSIS — F10229 Alcohol dependence with intoxication, unspecified: Secondary | ICD-10-CM | POA: Insufficient documentation

## 2019-05-06 DIAGNOSIS — Z79899 Other long term (current) drug therapy: Secondary | ICD-10-CM | POA: Insufficient documentation

## 2019-05-06 DIAGNOSIS — B182 Chronic viral hepatitis C: Secondary | ICD-10-CM | POA: Diagnosis not present

## 2019-05-06 DIAGNOSIS — Z5320 Procedure and treatment not carried out because of patient's decision for unspecified reasons: Secondary | ICD-10-CM | POA: Diagnosis not present

## 2019-05-06 DIAGNOSIS — S0031XA Abrasion of nose, initial encounter: Secondary | ICD-10-CM | POA: Diagnosis not present

## 2019-05-06 DIAGNOSIS — Y908 Blood alcohol level of 240 mg/100 ml or more: Secondary | ICD-10-CM | POA: Diagnosis not present

## 2019-05-06 DIAGNOSIS — S0990XA Unspecified injury of head, initial encounter: Secondary | ICD-10-CM | POA: Insufficient documentation

## 2019-05-06 DIAGNOSIS — S0083XA Contusion of other part of head, initial encounter: Secondary | ICD-10-CM | POA: Diagnosis not present

## 2019-05-06 DIAGNOSIS — W19XXXA Unspecified fall, initial encounter: Secondary | ICD-10-CM

## 2019-05-06 DIAGNOSIS — W1830XA Fall on same level, unspecified, initial encounter: Secondary | ICD-10-CM | POA: Insufficient documentation

## 2019-05-06 DIAGNOSIS — Y9301 Activity, walking, marching and hiking: Secondary | ICD-10-CM | POA: Insufficient documentation

## 2019-05-06 DIAGNOSIS — S80211A Abrasion, right knee, initial encounter: Secondary | ICD-10-CM | POA: Diagnosis not present

## 2019-05-06 DIAGNOSIS — D696 Thrombocytopenia, unspecified: Secondary | ICD-10-CM

## 2019-05-06 DIAGNOSIS — W101XXA Fall (on)(from) sidewalk curb, initial encounter: Secondary | ICD-10-CM | POA: Diagnosis not present

## 2019-05-06 DIAGNOSIS — F10920 Alcohol use, unspecified with intoxication, uncomplicated: Secondary | ICD-10-CM

## 2019-05-06 LAB — CBC WITH DIFFERENTIAL/PLATELET
Abs Immature Granulocytes: 0.01 10*3/uL (ref 0.00–0.07)
Basophils Absolute: 0 10*3/uL (ref 0.0–0.1)
Basophils Absolute: 0 10*3/uL (ref 0.0–0.2)
Basophils Relative: 1 %
Basos: 0 %
EOS (ABSOLUTE): 0.2 10*3/uL (ref 0.0–0.4)
Eos: 7 %
Eosinophils Absolute: 0.2 10*3/uL (ref 0.0–0.5)
Eosinophils Relative: 6 %
HCT: 36.9 % (ref 36.0–46.0)
Hematocrit: 40 % (ref 34.0–46.6)
Hemoglobin: 12.4 g/dL (ref 12.0–15.0)
Hemoglobin: 13.8 g/dL (ref 11.1–15.9)
Immature Grans (Abs): 0 10*3/uL (ref 0.0–0.1)
Immature Granulocytes: 0 %
Immature Granulocytes: 0 %
Lymphocytes Absolute: 0.8 10*3/uL (ref 0.7–3.1)
Lymphocytes Relative: 33 %
Lymphs Abs: 1.3 10*3/uL (ref 0.7–4.0)
Lymphs: 32 %
MCH: 31.8 pg (ref 26.0–34.0)
MCH: 31.7 pg (ref 26.6–33.0)
MCHC: 33.6 g/dL (ref 30.0–36.0)
MCHC: 34.5 g/dL (ref 31.5–35.7)
MCV: 94.6 fL (ref 80.0–100.0)
MCV: 92 fL (ref 79–97)
Monocytes Absolute: 0.5 10*3/uL (ref 0.1–1.0)
Monocytes Absolute: 0.4 10*3/uL (ref 0.1–0.9)
Monocytes Relative: 13 %
Monocytes: 16 %
Neutro Abs: 1.8 10*3/uL (ref 1.7–7.7)
Neutrophils Absolute: 1 10*3/uL — ABNORMAL LOW (ref 1.4–7.0)
Neutrophils Relative %: 47 %
Neutrophils: 45 %
Platelets: 29 10*3/uL — CL (ref 150–400)
Platelets: 22 10*3/uL — CL (ref 150–450)
RBC: 3.9 MIL/uL (ref 3.87–5.11)
RBC: 4.35 x10E6/uL (ref 3.77–5.28)
RDW: 16.1 % — ABNORMAL HIGH (ref 11.5–15.5)
RDW: 15.3 % (ref 11.7–15.4)
WBC: 3.8 10*3/uL — ABNORMAL LOW (ref 4.0–10.5)
WBC: 2.4 10*3/uL — CL (ref 3.4–10.8)
nRBC: 0 % (ref 0.0–0.2)

## 2019-05-06 LAB — COMPREHENSIVE METABOLIC PANEL
ALT: 23 U/L (ref 0–44)
ALT: 18 IU/L (ref 0–32)
AST: 48 U/L — ABNORMAL HIGH (ref 15–41)
AST: 40 IU/L (ref 0–40)
Albumin/Globulin Ratio: 0.8 — ABNORMAL LOW (ref 1.2–2.2)
Albumin: 3.1 g/dL — ABNORMAL LOW (ref 3.5–5.0)
Albumin: 3.6 g/dL — ABNORMAL LOW (ref 3.8–4.8)
Alkaline Phosphatase: 158 U/L — ABNORMAL HIGH (ref 38–126)
Alkaline Phosphatase: 190 IU/L — ABNORMAL HIGH (ref 39–117)
Anion gap: 15 (ref 5–15)
BUN/Creatinine Ratio: 13 (ref 9–23)
BUN: 5 mg/dL — ABNORMAL LOW (ref 6–20)
BUN: 9 mg/dL (ref 6–24)
Bilirubin Total: 1.1 mg/dL (ref 0.0–1.2)
CO2: 17 mmol/L — ABNORMAL LOW (ref 22–32)
CO2: 23 mmol/L (ref 20–29)
Calcium: 7.9 mg/dL — ABNORMAL LOW (ref 8.9–10.3)
Calcium: 9.1 mg/dL (ref 8.7–10.2)
Chloride: 100 mmol/L (ref 98–111)
Chloride: 101 mmol/L (ref 96–106)
Creatinine, Ser: 0.57 mg/dL (ref 0.44–1.00)
Creatinine, Ser: 0.67 mg/dL (ref 0.57–1.00)
GFR calc Af Amer: >60 mL/min (ref >60)
GFR calc Af Amer: 122 mL/min/{1.73_m2} (ref >59)
GFR calc non Af Amer: >60 mL/min (ref >60)
GFR calc non Af Amer: 106 mL/min/{1.73_m2} (ref >59)
Globulin, Total: 4.3 g/dL (ref 1.5–4.5)
Glucose, Bld: 97 mg/dL (ref 70–99)
Glucose: 96 mg/dL (ref 65–99)
Potassium: 3.2 mmol/L — ABNORMAL LOW (ref 3.5–5.1)
Potassium: 3.5 mmol/L (ref 3.5–5.2)
Sodium: 132 mmol/L — ABNORMAL LOW (ref 135–145)
Sodium: 139 mmol/L (ref 134–144)
Total Bilirubin: 0.9 mg/dL (ref 0.3–1.2)
Total Protein: 7.6 g/dL (ref 6.5–8.1)
Total Protein: 7.9 g/dL (ref 6.0–8.5)

## 2019-05-06 LAB — RAPID URINE DRUG SCREEN, HOSP PERFORMED
Amphetamines: NOT DETECTED
Barbiturates: NOT DETECTED
Benzodiazepines: NOT DETECTED
Cocaine: NOT DETECTED
Opiates: NOT DETECTED
Tetrahydrocannabinol: NOT DETECTED

## 2019-05-06 LAB — TSH: TSH: 1.47 u[IU]/mL (ref 0.450–4.500)

## 2019-05-06 LAB — LIPID PANEL
Chol/HDL Ratio: 2.6 ratio (ref 0.0–4.4)
Cholesterol, Total: 184 mg/dL (ref 100–199)
HDL: 72 mg/dL (ref >39)
LDL Calculated: 89 mg/dL (ref 0–99)
Triglycerides: 114 mg/dL (ref 0–149)
VLDL Cholesterol Cal: 23 mg/dL (ref 5–40)

## 2019-05-06 LAB — VITAMIN B12: Vitamin B-12: 508 pg/mL (ref 232–1245)

## 2019-05-06 LAB — TYPE AND SCREEN
ABO/RH(D): O POS
Antibody Screen: NEGATIVE

## 2019-05-06 LAB — ETHANOL: Alcohol, Ethyl (B): 291 mg/dL — ABNORMAL HIGH (ref <10)

## 2019-05-06 LAB — PROTIME-INR
INR: 1.1 (ref 0.8–1.2)
Prothrombin Time: 14.5 seconds (ref 11.4–15.2)

## 2019-05-06 LAB — VITAMIN D 25 HYDROXY (VIT D DEFICIENCY, FRACTURES): Vit D, 25-Hydroxy: 38.8 ng/mL (ref 30.0–100.0)

## 2019-05-06 NOTE — ED Provider Notes (Signed)
[facility] EMERGENCY DEPARTMENT Provider Note   CSN: [id number] Arrival date & time: 05/06/19  0011     History   Chief Complaint Chief Complaint  Patient presents with   Fall    mechanical    HPI Briana Sweeney is a 48 y.o. female.     The history is provided by the patient and medical records.  Fall     48 year old female with history of bipolar disorder, history of alcoholism with subsequent cirrhosis, hepatitis C, GERD, heroin abuse, migraine headaches, schizophrenia, presenting to the ED after a fall.  Patient currently intoxicated and had a mechanical fall while walking with friends on sidewalk.  She struck her head on the pavement but no LOC reported by friend with her.  Patient has large hematoma to left forehead, abrasions to face, and right knee.  She is not currently on anticoagulation.  She does have known thrombocytopenia, last platelet count was 66K.  Tetanus UTD based on chart review.  Past Medical History:  Diagnosis Date   Anxiety    Bipolar affective disorder ([place])    With anxiety features   Cirrhosis of liver ([place])    Due to alcohol and hepatitis C   Depression    Esophageal 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 (HCC)    ETOHism (HCC)    GERD (gastroesophageal reflux disease)    Hematemesis 02/10/2018   Hepatitis C 2018   hepatitis c and alcohol related hepatitis   Heroin abuse ([place])    History of blood transfusion    "blood doesn't clot; I fell down and had to have a transfusion"   History of kidney stones    Menopause 2016   Migraine    "when I get really stressed" (09/01/2017)   Schizophrenia ([place])    Seizures ([place])    "when I run out of my RX; lots recently" (09/01/2017)    Patient Active Problem List   Diagnosis Date Noted   Insomnia 05/05/2019   Dizziness 04/02/2019   Bruising 04/02/2019   Hyponatremia 04/02/2019   Head injury    Scalp laceration, initial encounter    Fall 01/20/2019   Epistaxis  01/20/2019   Altered mental status    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   Anxiety 10/27/2018   Neuropathy 10/27/2018   Abscess of bursa of left elbow 09/23/2018   Olecranon bursitis of left elbow    Erysipelas 99991111   Acute metabolic encephalopathy 0000000   Overdose 08/22/2018   Hypotension 08/22/2018   Seizure ([place]) 08/22/2018   Substance induced mood disorder ([place]) 07/17/2018   Cocaine dependence without complication ([place]) [id number]   Polysubstance abuse ([place]) 04/08/2018   Encephalopathy, 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[place]) 04/08/2018   Hepatitis C 03/18/2018   Portal hypertension ([place]) [id number]   Alcoholic intoxication without complication ([place]) [id number]   Depression 03/18/2018   Upper GI bleed 02/10/2018   Alcohol withdrawal, with unspecified complication ([place]) [id number]   Chronic anemia    Thrombocytopenia due to drugs    Suicidal ideation    Thrombocytopenia ([place]) 01/02/2018   GERD (gastroesophageal reflux disease) 11/01/2017   Trichimoniasis 10/30/2017   Alcohol dependence ([place]) 10/29/2017   MDD (major depressive disorder), severe ([place]) 10/28/2017   Acute hyperactive alcohol withdrawal delirium ([place]) 10/09/2017   Schizophrenia ([place]) 10/09/2017   Alcohol abuse with alcohol-induced mood disorder ([place])    Alcohol withdrawal syndrome with complication ([place]) [id number]   Esophageal varices without bleeding ([place])    Hematemesis 09/01/2017   Ascites due to alcoholic cirrhosis ([place])  Decompensated hepatic cirrhosis ([place]) 07/23/2017   Alcohol abuse 07/23/2017   Hepatitis C antibody positive in blood 07/23/2017   Hypokalemia 07/23/2017   Jaundice 07/23/2017   Coagulopathy ([place]) 07/23/2017   Hypomagnesemia 07/23/2017   Pancytopenia ([place]) [id number]   Alcoholic cirrhosis of liver with ascites ([place]) 07/23/2017   Bacterial vaginosis 06/03/2017   UTI (urinary tract infection) 06/02/2017    Past Surgical History:    Procedure Laterality Date   ESOPHAGOGASTRODUODENOSCOPY N/A 09/03/2017   Procedure: ESOPHAGOGASTRODUODENOSCOPY (EGD);  Surgeon: Doran Stabler, MD;  Location: [facility];  Service: Gastroenterology;  Laterality: N/A;   FINGER FRACTURE SURGERY Left    "shattered my pinky"   FRACTURE SURGERY     I&D EXTREMITY Left 09/18/2018   Procedure: IRRIGATION AND DEBRIDEMENT EXTREMITY;  Surgeon: Leanora Cover, MD;  Location: [facility];  Service: Orthopedics;  Laterality: Left;   IR PARACENTESIS  07/23/2017   IR PARACENTESIS  07/2017   "did it twice in the same week" (09/01/2017)   SHOULDER OPEN ROTATOR CUFF REPAIR Right    TUBAL LIGATION     VAGINAL HYSTERECTOMY       OB History   No obstetric history on file.      Home Medications    Prior to Admission medications   Medication Sig Start Date End Date Taking? Authorizing Provider  alum & mag hydroxide-simeth (MAALOX/MYLANTA) 200-200-20 MG/5ML suspension Take 15 mLs by mouth every 4 (four) hours as needed for indigestion or heartburn. 04/13/19   Hosie Poisson, MD  FLUoxetine (PROZAC) 40 MG capsule Take 1 capsule (40 mg total) by mouth daily. 04/13/19   Hosie Poisson, MD  folic acid (FOLVITE) 1 MG tablet Take 1 tablet (1 mg total) by mouth daily. 04/13/19   Hosie Poisson, MD  furosemide (LASIX) 40 MG tablet Take 1 tablet (40 mg total) by mouth daily. 04/13/19 12/09/19  Hosie Poisson, MD  gabapentin (NEURONTIN) 300 MG capsule Take 2 capsules 2 times a day. 04/13/19   Hosie Poisson, MD  hydrOXYzine (ATARAX/VISTARIL) 25 MG tablet Take 1 tablet (25 mg total) by mouth every 6 (six) hours as needed for itching. 04/13/19   Hosie Poisson, MD  lactulose (CHRONULAC) 10 GM/15ML solution Take 30 mLs (20 g total) by mouth 3 (three) times daily. Goal to have 2-3 BMs daily 04/13/19   Hosie Poisson, MD  Multiple Vitamin (MULTIVITAMIN WITH MINERALS) TABS tablet Take 1 tablet by mouth daily. 04/13/19   Hosie Poisson, MD  pantoprazole (PROTONIX) 40 MG tablet Take 1 tablet  (40 mg total) by mouth 2 (two) times daily. 04/13/19   Hosie Poisson, MD  phenytoin (DILANTIN) 300 MG ER capsule Take 1 capsule (300 mg total) by mouth daily. 04/13/19   Hosie Poisson, MD  polyethylene glycol (MIRALAX / GLYCOLAX) 17 g packet Take 17 g by mouth 2 (two) times daily. 04/13/19   Hosie Poisson, MD  thiamine 100 MG tablet Take 1 tablet (100 mg total) by mouth daily. 04/14/19   Hosie Poisson, MD  topiramate (TOPAMAX) 50 MG tablet Take 1 tablet (50 mg total) by mouth daily. 04/13/19   Hosie Poisson, MD  traZODone (DESYREL) 50 MG tablet Take 2 tablets (100 mg total) by mouth at bedtime. 04/13/19   Hosie Poisson, MD    Family History Family History  Problem Relation Age of Onset   Lung cancer Mother 35   Alcohol abuse Mother    Throat cancer Father 39    Social History Social History   Tobacco Use   Smoking status:  Never Smoker   Smokeless tobacco: Never Used  Substance Use Topics   Alcohol use: Yes    Alcohol/week: 63.0 standard drinks    Types: 63 Cans of beer per week    Comment: weekly "I have cut back"; 4 days 4 vs 12 pack    Drug use: Yes    Types: Marijuana, Cocaine    Comment: 2 weeks;      Allergies   Haldol [haloperidol]   Review of Systems Review of Systems  Musculoskeletal: Positive for arthralgias.  Skin: Positive for wound.  All other systems reviewed and are negative.    Physical Exam Updated Vital Signs BP 97/63    Pulse 73    Resp 14    SpO2 100%   Physical Exam Vitals signs and nursing note reviewed.  Constitutional:      Appearance: She is well-developed.     Comments: Appears intoxicated, breath smells of EtOH  HENT:     Head: Normocephalic and atraumatic.     Comments: Large hematoma to left forehead with overlying abrasions, no active bleeding    Nose:     Comments: Abrasion noted beneath left nostril and across bridge of nose, wounds are hemostatic Eyes:     Conjunctiva/sclera: Conjunctivae normal.     Pupils: Pupils are equal,  round, and reactive to light.  Neck:     Comments: c-collar in place, ROM not tested Cardiovascular:     Rate and Rhythm: Normal rate and regular rhythm.     Heart sounds: Normal heart sounds.  Pulmonary:     Effort: Pulmonary effort is normal.     Breath sounds: Normal breath sounds.  Abdominal:     General: Bowel sounds are normal.     Palpations: Abdomen is soft.  Musculoskeletal: Normal range of motion.     Comments: Abrasions to right knee, no acute deformity or significant swelling  Skin:    General: Skin is warm and dry.  Neurological:     Mental Status: She is alert and oriented to person, place, and time.     Comments: Awake, alert, oriented, intoxicated, moving extremities well, able to follow commands      ED Treatments / Results  Labs (all labs ordered are listed, but only abnormal results are displayed) Labs Reviewed  CBC WITH DIFFERENTIAL/PLATELET - Abnormal; Notable for the following components:      Result Value   WBC 3.8 (*)    RDW 16.1 (*)    Platelets 29 (*)    All other components within normal limits  COMPREHENSIVE METABOLIC PANEL - Abnormal; Notable for the following components:   Sodium 132 (*)    Potassium 3.2 (*)    CO2 17 (*)    BUN 5 (*)    Calcium 7.9 (*)    Albumin 3.1 (*)    AST 48 (*)    Alkaline Phosphatase 158 (*)    All other components within normal limits  ETHANOL - Abnormal; Notable for the following components:   Alcohol, Ethyl (B) 291 (*)    All other components within normal limits  PROTIME-INR  RAPID URINE DRUG SCREEN, HOSP PERFORMED  TYPE AND SCREEN    EKG None  Radiology Ct Head Wo Contrast  Result Date: 05/06/2019 CLINICAL DATA:  48 year old female with head trauma. EXAM: CT HEAD WITHOUT CONTRAST CT MAXILLOFACIAL WITHOUT CONTRAST CT CERVICAL SPINE WITHOUT CONTRAST TECHNIQUE: Multidetector CT imaging of the head, cervical spine, and maxillofacial structures were performed using the standard protocol without  intravenous contrast. Multiplanar CT image reconstructions of the cervical spine and maxillofacial structures were also generated. COMPARISON:  Head MRI dated 04/07/2019 FINDINGS: Evaluation is limited due to streak artifact caused by metallic yelling. CT HEAD FINDINGS Brain: Mild atrophy, advanced for age. There is no acute intracranial hemorrhage. No mass effect or midline shift. No extra-axial fluid collection. Vascular: No hyperdense vessel or unexpected calcification. Skull: Normal. Negative for fracture or focal lesion. Other: Left forehead hematoma. CT MAXILLOFACIAL FINDINGS Evaluation is limited due to severe motion artifact. Osseous: No definite acute fracture. Orbits: The globes and retro-orbital fat appear unremarkable. Sinuses: No air-fluid levels. Soft tissues: Negative. CT CERVICAL SPINE FINDINGS Alignment: No acute subluxation. There is reversal of normal cervical lordosis which may be positional or due to muscle spasm. Skull base and vertebrae: No acute fracture. Soft tissues and spinal canal: No prevertebral fluid or swelling. No visible canal hematoma. Disc levels:  Mild degenerative changes Upper chest: Negative. Other: None IMPRESSION: 1. No acute intracranial hemorrhage. 2. No acute/traumatic cervical spine pathology. 3. No obvious acute facial bone fractures. Electronically Signed   By: Anner Crete M.D.   On: 05/06/2019 02:11   Ct Cervical Spine Wo Contrast  Result Date: 05/06/2019 CLINICAL DATA:  48 year old female with head trauma. EXAM: CT HEAD WITHOUT CONTRAST CT MAXILLOFACIAL WITHOUT CONTRAST CT CERVICAL SPINE WITHOUT CONTRAST TECHNIQUE: Multidetector CT imaging of the head, cervical spine, and maxillofacial structures were performed using the standard protocol without intravenous contrast. Multiplanar CT image reconstructions of the cervical spine and maxillofacial structures were also generated. COMPARISON:  Head MRI dated 04/07/2019 FINDINGS: Evaluation is limited due to streak  artifact caused by metallic yelling. CT HEAD FINDINGS Brain: Mild atrophy, advanced for age. There is no acute intracranial hemorrhage. No mass effect or midline shift. No extra-axial fluid collection. Vascular: No hyperdense vessel or unexpected calcification. Skull: Normal. Negative for fracture or focal lesion. Other: Left forehead hematoma. CT MAXILLOFACIAL FINDINGS Evaluation is limited due to severe motion artifact. Osseous: No definite acute fracture. Orbits: The globes and retro-orbital fat appear unremarkable. Sinuses: No air-fluid levels. Soft tissues: Negative. CT CERVICAL SPINE FINDINGS Alignment: No acute subluxation. There is reversal of normal cervical lordosis which may be positional or due to muscle spasm. Skull base and vertebrae: No acute fracture. Soft tissues and spinal canal: No prevertebral fluid or swelling. No visible canal hematoma. Disc levels:  Mild degenerative changes Upper chest: Negative. Other: None IMPRESSION: 1. No acute intracranial hemorrhage. 2. No acute/traumatic cervical spine pathology. 3. No obvious acute facial bone fractures. Electronically Signed   By: Anner Crete M.D.   On: 05/06/2019 02:11   Ct Maxillofacial Wo Contrast  Result Date: 05/06/2019 CLINICAL DATA:  48 year old female with head trauma. EXAM: CT HEAD WITHOUT CONTRAST CT MAXILLOFACIAL WITHOUT CONTRAST CT CERVICAL SPINE WITHOUT CONTRAST TECHNIQUE: Multidetector CT imaging of the head, cervical spine, and maxillofacial structures were performed using the standard protocol without intravenous contrast. Multiplanar CT image reconstructions of the cervical spine and maxillofacial structures were also generated. COMPARISON:  Head MRI dated 04/07/2019 FINDINGS: Evaluation is limited due to streak artifact caused by metallic yelling. CT HEAD FINDINGS Brain: Mild atrophy, advanced for age. There is no acute intracranial hemorrhage. No mass effect or midline shift. No extra-axial fluid collection. Vascular: No  hyperdense vessel or unexpected calcification. Skull: Normal. Negative for fracture or focal lesion. Other: Left forehead hematoma. CT MAXILLOFACIAL FINDINGS Evaluation is limited due to severe motion artifact. Osseous: No definite acute fracture. Orbits: The globes and retro-orbital  fat appear unremarkable. Sinuses: No air-fluid levels. Soft tissues: Negative. CT CERVICAL SPINE FINDINGS Alignment: No acute subluxation. There is reversal of normal cervical lordosis which may be positional or due to muscle spasm. Skull base and vertebrae: No acute fracture. Soft tissues and spinal canal: No prevertebral fluid or swelling. No visible canal hematoma. Disc levels:  Mild degenerative changes Upper chest: Negative. Other: None IMPRESSION: 1. No acute intracranial hemorrhage. 2. No acute/traumatic cervical spine pathology. 3. No obvious acute facial bone fractures. Electronically Signed   By: Anner Crete M.D.   On: 05/06/2019 02:11    Procedures Procedures (including critical care time)  Medications Ordered in ED Medications - No data to display   Initial Impression / Assessment and Plan / ED Course  I have reviewed the triage vital signs and the nursing notes.  Pertinent labs & imaging results that were available during my care of the patient were reviewed by me and considered in my medical decision making (see chart for details).  48 year old female here after mechanical fall.  Has EtOH on board, reportedly tripped and fell, struck head and face on sidewalk.  No loss of consciousness reported by a friend.  On arrival patient is awake, alert, but she does appear intoxicated.  She has large hematoma to left forehead and multiple abrasions to face as well as right knee.  She is not on anticoagulation but has chronic thrombocytopenia due to her history of alcoholism.  Will obtain CT of the head/face/cervical spine and films of right knee.  Labs pending including PT-INR and type and screen.  Tetanus is  UTD.  Will monitor closely.  CT is negative for any acute intracranial pathology or facial fractures.  Patient removed her own c-collar prior to reassessment.  Labs with ethanol of 291.  Her platelet count is 29, thrombocytopenia is known and chronic secondary to her liver cirrhosis.  Will need to monitor until clinically sober.  3:51 AM X-ray has tried twice to get films of right knee, patient continues to refuse.  She does not have any acute deformities on exam and my suspicion for acute fracture is low.  Will discontinue for now, reassess once clinically sober.  6:11 AM Patient has been observed overnight, no acute events.  She is now awake, alert, oriented and able to walk to bathroom and wash up independently.  Boyfriend here in the ED to take her home.  She was advised of lab/imaging findings.  In regards to knee, states it just feels sore but she is not having any trouble walking.  Can defer knee films at this time.  She appears stable for discharge home.  She was advised to be very careful and drink responsibly as her low platelet count will put her at heightened risk of bleeding from injuries.  Follow-up with PCP.  Return here for any new/acute changes.  Final Clinical Impressions(s) / ED Diagnoses   Final diagnoses:  Fall, initial encounter  Contusion of face, initial encounter  Alcoholic intoxication without complication ([place])  Thrombocytopenia [facility])    ED Discharge Orders    None       Larene Pickett, PA-C 05/06/19 0631    Mesner, Corene Cornea, MD 05/06/19 [id number]

## 2019-05-06 NOTE — ED Triage Notes (Signed)
Pt with alcohol intoxication fell, stiking the side of there head and right knee. Presents to the ed with neck collar. 96%RA, 100/72, 94p,

## 2019-05-06 NOTE — ED Notes (Signed)
Pt requesting to have ccollar off due to it being uncomfortable, this RN offered to readjust collar for patient but advised her that until ct scans are done, collar needs to remain on as a precaution in case she has a neck injury. Pt stated "I am not wearing this collar anymore!" this RN advised patient that I would not remove her ccollar due to it being in place to keep her neck in place, pt then removed ccollar herself.

## 2019-05-06 NOTE — Discharge Instructions (Signed)
Your platelets continue to be low today.  This puts you at risk for bleeding so be careful and drink responsibly. Follow-up with your primary care doctor. Return here for any new/acute changes.

## 2019-05-06 NOTE — ED Notes (Signed)
Taken to xray.

## 2019-05-07 ENCOUNTER — Telehealth: Payer: Self-pay | Admitting: Family Medicine

## 2019-05-07 NOTE — Telephone Encounter (Signed)
Called pt back.

## 2019-05-12 ENCOUNTER — Other Ambulatory Visit: Payer: Self-pay

## 2019-05-12 ENCOUNTER — Ambulatory Visit (HOSPITAL_COMMUNITY)
Admission: RE | Admit: 2019-05-12 | Discharge: 2019-05-12 | Disposition: A | Discharge status: Home / Self Care | Payer: Medicaid Other | Source: Ambulatory Visit | Attending: Family Medicine | Admitting: Family Medicine

## 2019-05-12 DIAGNOSIS — D696 Thrombocytopenia, unspecified: Secondary | ICD-10-CM | POA: Diagnosis not present

## 2019-05-12 MED ORDER — SODIUM CHLORIDE 0.9 % IV SOLN
INTRAVENOUS | Status: DC
  Administered 2019-05-12: 10:00:00 via INTRAVENOUS

## 2019-05-12 NOTE — Progress Notes (Signed)
                   Patient Briana Sweeney Note    Provider: Kathe Becton, FNP   Procedure: 1 unit of Platelets   Note: Received 1 unit of platelets via PIV. Tolerated well. Vitals stable. Discharge instructions given. Alert, oriented and ambulatory at discharge.   Otho Bellows, RN

## 2019-05-12 NOTE — Progress Notes (Signed)
RN Caryl Pina stated she no longer PIV for this patient she was able to establish one

## 2019-05-12 NOTE — Discharge Instructions (Signed)
Platelet Count Test Why am I having this test? Platelets are specialized cells that help the blood clot. When you get a tissue injury like a cut, platelets gather at the site of the injury to stop the bleeding. You may have a platelet count test:  If you have symptoms that may be related to excess bleeding or delayed blood clotting, such as: ? A rash of pinprick-sized red and purple dots on the skin (petechiae). These are small collections of blood (hemorrhages) in the skin. ? Heavy menstrual bleeding.  To help monitor treatment for: ? Thrombocytopenia. This is a condition in which you have a low platelet count. ? Bone marrow failure. What is being tested? This test measures how many platelets you have within a specific amount (volume) of blood. What kind of sample is taken?  A blood sample is required for this test. It is usually collected by inserting a needle into a blood vessel or by sticking a finger with a small needle. Tell a health care provider about:  Any allergies you have.  All medicines you are taking, including vitamins, herbs, eye drops, creams, and over-the-counter medicines.  Any blood disorders you have.  Any surgeries you have had.  Any medical conditions you have.  Whether you are pregnant or may be pregnant. How are the results reported? Your test results will be reported as a value that indicates how many platelets are in the blood volume. This will be given as platelets per cubic millimeter (mm3) of blood. Your health care provider will compare your results to normal ranges that were established after testing a large group of people (reference ranges). Reference ranges may vary among labs and hospitals. For this test, common reference ranges are:  Adult or elderly: 150,000-400,000/mm3.  Child: 150,000-400,000/mm3.  Infant: 200,000-475,000/mm3.  Premature infant: 100,000-300,000/mm3.  Newborn: 150,000-300,000/mm3. What do the results mean? A result  that is within your reference range is considered normal, meaning that you have a normal amount of platelets in your blood. A result that is higher than your reference range means that you have too many platelets in your blood. This may mean that you have:  Certain types of cancer, such as leukemia or lymphoma.  A condition in which the bone marrow produces excess amounts of all cell types, including platelets (polycythemia vera).  A condition that can occur after surgery to remove the spleen (post-splenectomy syndrome).  Rheumatoid arthritis.  Anemia due to lack of iron (iron-deficiency anemia). A result that is lower than your reference range means that you have too few platelets in your blood. This may mean that you have:  A condition in which the spleen breaks down platelets faster than normal (hypersplenism).  A hemorrhage somewhere in your body.  Low platelet count due to your body's disease-fighting system attacking your platelets (immune thrombocytopenia).  Cancer. Chemotherapy treatments for cancers such as leukemia can also cause low platelet count.  A rare, serious form of thrombocytopenia that causes blood clots (thrombotic thrombocytopenia).  HELLP syndrome, a disorder of pregnancy that causes high blood pressure and other serious problems.  Certain disorders that are passed from parent to child (inherited) that cause a low platelet count.  A condition in which the proteins that control blood clotting are overactive, causing abnormal clotting processes to occur (disseminated intravascular coagulation, DIC).  A disease that causes long-term inflammation and pain in many parts of the body (systemic lupus erythematosus, SLE).  Certain types of anemia, such as pernicious anemia or hemolytic anemia.    Infection. Talk with your health care provider about what your results mean. Questions to ask your health care provider Ask your health care provider, or the department that  is doing the test:  When will my results be ready?  How will I get my results?  What are my treatment options?  What other tests do I need?  What are my next steps? Summary  Platelets are specialized cells that help the blood clot. When you get a tissue injury like a cut, platelets gather at the site of the injury to stop the bleeding.  This test measures how many platelets you have within a specific amount (volume) of blood.  Talk with your health care provider about what your results mean. This information is not intended to replace advice given to you by your health care provider. Make sure you discuss any questions you have with your health care provider. Document Released: 09/20/2004 Document Revised: 05/19/2017 Document Reviewed: 05/19/2017 Elsevier Patient Education  2020 Elsevier Inc.  

## 2019-05-13 LAB — PREPARE PLATELET PHERESIS: Unit division: 0

## 2019-05-13 LAB — BPAM PLATELET PHERESIS
Blood Product Expiration Date: 202009042359
ISSUE DATE / TIME: 202009021113
Unit Type and Rh: 5100

## 2019-05-24 ENCOUNTER — Other Ambulatory Visit: Payer: Self-pay | Admitting: Obstetrics and Gynecology

## 2019-05-24 ENCOUNTER — Telehealth: Payer: Self-pay

## 2019-05-24 ENCOUNTER — Other Ambulatory Visit: Payer: Self-pay | Admitting: Family Medicine

## 2019-05-24 ENCOUNTER — Telehealth: Payer: Self-pay | Admitting: Pharmacy Technician

## 2019-05-24 ENCOUNTER — Telehealth: Payer: Self-pay | Admitting: Family Medicine

## 2019-05-24 DIAGNOSIS — N644 Mastodynia: Secondary | ICD-10-CM

## 2019-05-24 NOTE — Telephone Encounter (Signed)
Called and spoke pt and explain to her that she already has refills on her medication that she just needs to call the pharmacy and them to refill her medication .

## 2019-05-24 NOTE — Telephone Encounter (Signed)
[facility] Patient Advocate Encounter  Patient called this morning to check on the status of her Hepatitis C medication being prescribed. She stated she received a message from the referral and the primary care provider that we had dropped her care because she no longer needed treatment. Messaged Marya Amsler to confirm the status of the patient and see if we need to proceed with her medication and prior authorization process with [place] [employer]. Informed her we would be in touch once we knew the next steps. [phone] new cell

## 2019-05-24 NOTE — Telephone Encounter (Signed)
Called and spoke with pt and inform her that she all ready had refill on her medication she will just need to call the pharmacy and tell that she need her medications refills. I tried to explain this patient several times pt up the phone on me.

## 2019-05-26 MED FILL — FLUoxetine HCL 40 MG CAPS: 40 | 30 days supply | Qty: 30 | Fill #0

## 2019-05-26 MED FILL — hydrOXYzine HCL 25 MG TABS: 25 | 5 days supply | Qty: 20 | Fill #0

## 2019-05-26 MED FILL — GABAPENTIN 300 MG CAPSULE: 300 | 30 days supply | Qty: 120 | Fill #1

## 2019-05-26 MED FILL — FOLIC ACID 1 MG TABS: 1 | 30 days supply | Qty: 30 | Fill #1

## 2019-05-26 MED FILL — PHENYTOIN SOD EXT 300 MG CA: 300 | 30 days supply | Qty: 30 | Fill #0

## 2019-05-26 MED FILL — FUROSEMIDE 40 MG TAB: 40 | 30 days supply | Qty: 30 | Fill #1

## 2019-06-01 ENCOUNTER — Other Ambulatory Visit: Payer: Medicaid Other

## 2019-06-08 ENCOUNTER — Telehealth: Payer: Self-pay | Admitting: Family Medicine

## 2019-06-08 ENCOUNTER — Other Ambulatory Visit: Payer: Self-pay

## 2019-06-08 MED ORDER — HYDROXYZINE HCL 25 MG PO TABS: 25.0000 mg | ORAL_TABLET | Freq: Four times a day (QID) | ORAL | 1 refills | Status: DC | PRN

## 2019-06-08 NOTE — Telephone Encounter (Signed)
Called & informed pt.

## 2019-06-10 ENCOUNTER — Ambulatory Visit (INDEPENDENT_AMBULATORY_CARE_PROVIDER_SITE_OTHER): Payer: Medicaid Other | Admitting: Psychiatry

## 2019-06-10 ENCOUNTER — Other Ambulatory Visit: Payer: Self-pay

## 2019-06-10 ENCOUNTER — Encounter (HOSPITAL_COMMUNITY): Payer: Self-pay | Admitting: Psychiatry

## 2019-06-10 DIAGNOSIS — F1994 Other psychoactive substance use, unspecified with psychoactive substance-induced mood disorder: Secondary | ICD-10-CM | POA: Diagnosis not present

## 2019-06-10 DIAGNOSIS — F1021 Alcohol dependence, in remission: Secondary | ICD-10-CM

## 2019-06-10 DIAGNOSIS — F1910 Other psychoactive substance abuse, uncomplicated: Secondary | ICD-10-CM | POA: Diagnosis not present

## 2019-06-10 MED ORDER — MIRTAZAPINE 15 MG PO TABS: 15.0000 mg | ORAL_TABLET | Freq: Every day | ORAL | 1 refills | Status: DC

## 2019-06-10 MED ORDER — ARIPIPRAZOLE 5 MG PO TABS: 5.0000 mg | ORAL_TABLET | Freq: Every day | ORAL | 1 refills | Status: DC

## 2019-06-10 MED FILL — ARIPiprazole 5 MG TABS: 5 | 30 days supply | Qty: 30 | Fill #0

## 2019-06-10 MED FILL — MIRTAZAPINE 15 MG TABS: 15 | 30 days supply | Qty: 30 | Fill #0
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
## 2019-06-11 NOTE — Progress Notes (Signed)
This encounter was created in error - please disregard.

## 2019-06-14 ENCOUNTER — Other Ambulatory Visit: Payer: Self-pay | Admitting: Family

## 2019-06-14 NOTE — Progress Notes (Signed)
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 APRI score of 4.13 and FIB-4 of 16.55 with concern for advance fibrosis and decompensated cirrhosis. Per her medication list she is currently on phenytoin which has medication interactions with all of the Hepatitis C medications. She would need to be changed to either Keppra or Depakote in order to pursue Hepatitis C treatment. Will route to primary care and may need neurology evaluation/follow up.

## 2019-06-15 ENCOUNTER — Other Ambulatory Visit: Payer: Self-pay | Admitting: Family Medicine

## 2019-06-15 DIAGNOSIS — R569 Unspecified convulsions: Secondary | ICD-10-CM

## 2019-06-15 MED ORDER — LEVETIRACETAM 500 MG PO TABS: 500.0000 mg | ORAL_TABLET | Freq: Two times a day (BID) | ORAL | 3 refills | Status: DC

## 2019-06-16 MED FILL — levETIRAcetam 500 MG TABS: 500 | 90 days supply | Qty: 180 | Fill #0

## 2019-06-16 MED FILL — ARIPiprazole 5 MG TABS: 5 | 30 days supply | Qty: 30 | Fill #1

## 2019-06-17 MED FILL — FLUoxetine HCL 40 MG CAPS: 40 | 30 days supply | Qty: 30 | Fill #1

## 2019-06-22 ENCOUNTER — Encounter: Payer: Self-pay | Admitting: Neurology

## 2019-07-03 ENCOUNTER — Other Ambulatory Visit: Payer: Self-pay

## 2019-07-03 ENCOUNTER — Emergency Department (HOSPITAL_COMMUNITY): Payer: Medicaid Other

## 2019-07-03 ENCOUNTER — Encounter (HOSPITAL_COMMUNITY): Payer: Self-pay | Admitting: Emergency Medicine

## 2019-07-03 ENCOUNTER — Inpatient Hospital Stay (HOSPITAL_COMMUNITY)
Admission: EM | Admit: 2019-07-03 | Discharge: 2019-07-15 | DRG: 813 | Disposition: A | Discharge status: Home / Self Care | Payer: Medicaid Other | Attending: Internal Medicine | Admitting: Internal Medicine

## 2019-07-03 DIAGNOSIS — K7011 Alcoholic hepatitis with ascites: Secondary | ICD-10-CM | POA: Diagnosis present

## 2019-07-03 DIAGNOSIS — I10 Essential (primary) hypertension: Secondary | ICD-10-CM | POA: Diagnosis present

## 2019-07-03 DIAGNOSIS — G92 Toxic encephalopathy: Secondary | ICD-10-CM | POA: Diagnosis not present

## 2019-07-03 DIAGNOSIS — F10239 Alcohol dependence with withdrawal, unspecified: Secondary | ICD-10-CM | POA: Diagnosis present

## 2019-07-03 DIAGNOSIS — F1010 Alcohol abuse, uncomplicated: Secondary | ICD-10-CM | POA: Diagnosis present

## 2019-07-03 DIAGNOSIS — D500 Iron deficiency anemia secondary to blood loss (chronic): Secondary | ICD-10-CM | POA: Diagnosis present

## 2019-07-03 DIAGNOSIS — D6959 Other secondary thrombocytopenia: Secondary | ICD-10-CM | POA: Diagnosis not present

## 2019-07-03 DIAGNOSIS — E876 Hypokalemia: Secondary | ICD-10-CM | POA: Diagnosis not present

## 2019-07-03 DIAGNOSIS — Z888 Allergy status to other drugs, medicaments and biological substances status: Secondary | ICD-10-CM | POA: Diagnosis not present

## 2019-07-03 DIAGNOSIS — D61818 Other pancytopenia: Secondary | ICD-10-CM

## 2019-07-03 DIAGNOSIS — F209 Schizophrenia, unspecified: Secondary | ICD-10-CM | POA: Diagnosis present

## 2019-07-03 DIAGNOSIS — M549 Dorsalgia, unspecified: Secondary | ICD-10-CM | POA: Diagnosis present

## 2019-07-03 DIAGNOSIS — R0789 Other chest pain: Secondary | ICD-10-CM | POA: Diagnosis present

## 2019-07-03 DIAGNOSIS — T510X1A Toxic effect of ethanol, accidental (unintentional), initial encounter: Secondary | ICD-10-CM | POA: Diagnosis present

## 2019-07-03 DIAGNOSIS — K2921 Alcoholic gastritis with bleeding: Secondary | ICD-10-CM | POA: Diagnosis present

## 2019-07-03 DIAGNOSIS — K219 Gastro-esophageal reflux disease without esophagitis: Secondary | ICD-10-CM | POA: Diagnosis present

## 2019-07-03 DIAGNOSIS — Z20828 Contact with and (suspected) exposure to other viral communicable diseases: Secondary | ICD-10-CM | POA: Diagnosis present

## 2019-07-03 DIAGNOSIS — R197 Diarrhea, unspecified: Secondary | ICD-10-CM | POA: Diagnosis not present

## 2019-07-03 DIAGNOSIS — D708 Other neutropenia: Secondary | ICD-10-CM | POA: Diagnosis present

## 2019-07-03 DIAGNOSIS — D696 Thrombocytopenia, unspecified: Secondary | ICD-10-CM | POA: Diagnosis present

## 2019-07-03 DIAGNOSIS — D693 Immune thrombocytopenic purpura: Secondary | ICD-10-CM | POA: Diagnosis present

## 2019-07-03 DIAGNOSIS — B1920 Unspecified viral hepatitis C without hepatic coma: Secondary | ICD-10-CM | POA: Diagnosis present

## 2019-07-03 DIAGNOSIS — K7031 Alcoholic cirrhosis of liver with ascites: Secondary | ICD-10-CM | POA: Diagnosis present

## 2019-07-03 DIAGNOSIS — F1020 Alcohol dependence, uncomplicated: Secondary | ICD-10-CM | POA: Diagnosis not present

## 2019-07-03 DIAGNOSIS — M542 Cervicalgia: Secondary | ICD-10-CM | POA: Diagnosis present

## 2019-07-03 DIAGNOSIS — F1910 Other psychoactive substance abuse, uncomplicated: Secondary | ICD-10-CM | POA: Diagnosis present

## 2019-07-03 DIAGNOSIS — Z87442 Personal history of urinary calculi: Secondary | ICD-10-CM

## 2019-07-03 DIAGNOSIS — E871 Hypo-osmolality and hyponatremia: Secondary | ICD-10-CM | POA: Diagnosis not present

## 2019-07-03 DIAGNOSIS — Z808 Family history of malignant neoplasm of other organs or systems: Secondary | ICD-10-CM

## 2019-07-03 DIAGNOSIS — Z811 Family history of alcohol abuse and dependence: Secondary | ICD-10-CM

## 2019-07-03 DIAGNOSIS — Z801 Family history of malignant neoplasm of trachea, bronchus and lung: Secondary | ICD-10-CM

## 2019-07-03 DIAGNOSIS — Z79899 Other long term (current) drug therapy: Secondary | ICD-10-CM

## 2019-07-03 LAB — CBC
HCT: 38.3 % (ref 36.0–46.0)
Hemoglobin: 13 g/dL (ref 12.0–15.0)
MCH: 31 pg (ref 26.0–34.0)
MCHC: 33.9 g/dL (ref 30.0–36.0)
MCV: 91.4 fL (ref 80.0–100.0)
Platelets: 12 10*3/uL — CL (ref 150–400)
RBC: 4.19 MIL/uL (ref 3.87–5.11)
RDW: 19.8 % — ABNORMAL HIGH (ref 11.5–15.5)
WBC: 2.8 10*3/uL — ABNORMAL LOW (ref 4.0–10.5)
nRBC: 0 % (ref 0.0–0.2)

## 2019-07-03 LAB — COMPREHENSIVE METABOLIC PANEL
ALT: 48 U/L — ABNORMAL HIGH (ref 0–44)
AST: 183 U/L — ABNORMAL HIGH (ref 15–41)
Albumin: 3.2 g/dL — ABNORMAL LOW (ref 3.5–5.0)
Alkaline Phosphatase: 241 U/L — ABNORMAL HIGH (ref 38–126)
Anion gap: 15 (ref 5–15)
BUN: <5 mg/dL — ABNORMAL LOW (ref 6–20)
CO2: 19 mmol/L — ABNORMAL LOW (ref 22–32)
Calcium: 8.6 mg/dL — ABNORMAL LOW (ref 8.9–10.3)
Chloride: 99 mmol/L (ref 98–111)
Creatinine, Ser: 0.52 mg/dL (ref 0.44–1.00)
GFR calc Af Amer: >60 mL/min (ref >60)
GFR calc non Af Amer: >60 mL/min (ref >60)
Glucose, Bld: 97 mg/dL (ref 70–99)
Potassium: 3.9 mmol/L (ref 3.5–5.1)
Sodium: 133 mmol/L — ABNORMAL LOW (ref 135–145)
Total Bilirubin: 2.2 mg/dL — ABNORMAL HIGH (ref 0.3–1.2)
Total Protein: 8.6 g/dL — ABNORMAL HIGH (ref 6.5–8.1)

## 2019-07-03 LAB — URINALYSIS, ROUTINE W REFLEX MICROSCOPIC
Bilirubin Urine: NEGATIVE
Glucose, UA: NEGATIVE mg/dL
Hgb urine dipstick: NEGATIVE
Ketones, ur: NEGATIVE mg/dL
Leukocytes,Ua: NEGATIVE
Nitrite: NEGATIVE
Protein, ur: NEGATIVE mg/dL
Specific Gravity, Urine: 1.002 — ABNORMAL LOW (ref 1.005–1.030)
pH: 5 (ref 5.0–8.0)

## 2019-07-03 LAB — TYPE AND SCREEN
ABO/RH(D): O POS
Antibody Screen: NEGATIVE

## 2019-07-03 LAB — I-STAT BETA HCG BLOOD, ED (MC, WL, AP ONLY): I-stat hCG, quantitative: <5 m[IU]/mL (ref <5)

## 2019-07-03 LAB — TROPONIN I (HIGH SENSITIVITY)
Troponin I (High Sensitivity): 5 ng/L (ref <18)
Troponin I (High Sensitivity): 4 ng/L (ref <18)

## 2019-07-03 LAB — LIPASE, BLOOD: Lipase: 68 U/L — ABNORMAL HIGH (ref 11–51)

## 2019-07-03 MED ORDER — SODIUM CHLORIDE 0.9% FLUSH: 3.0000 mL | Freq: Once | INTRAVENOUS | Status: DC

## 2019-07-03 MED ORDER — ADULT MULTIVITAMIN W/MINERALS CH
1.0000 | ORAL_TABLET | Freq: Every day | ORAL | Status: DC
  Administered 2019-07-03 – 2019-07-05 (×3): 1 via ORAL
  Filled 2019-07-03 (×3): qty 1

## 2019-07-03 MED ORDER — THIAMINE HCL 100 MG/ML IJ SOLN
100.0000 mg | Freq: Every day | INTRAMUSCULAR | Status: DC
  Administered 2019-07-03: 100 mg via INTRAVENOUS
  Filled 2019-07-03: qty 2

## 2019-07-03 MED ORDER — LORAZEPAM 2 MG/ML IJ SOLN
1.0000 mg | INTRAMUSCULAR | Status: AC | PRN
  Administered 2019-07-04 (×3): 2 mg via INTRAVENOUS
  Administered 2019-07-04: 4 mg via INTRAVENOUS
  Administered 2019-07-04: 2 mg via INTRAVENOUS
  Administered 2019-07-04: 4 mg via INTRAVENOUS
  Administered 2019-07-05 (×2): 2 mg via INTRAVENOUS
  Administered 2019-07-05: 1 mg via INTRAVENOUS
  Administered 2019-07-05 (×3): 2 mg via INTRAVENOUS
  Administered 2019-07-06: 4 mg via INTRAVENOUS
  Administered 2019-07-06: 2 mg via INTRAVENOUS
  Administered 2019-07-06 (×2): 4 mg via INTRAVENOUS
  Filled 2019-07-03 (×3): qty 2
  Filled 2019-07-03 (×3): qty 1
  Filled 2019-07-03: qty 2
  Filled 2019-07-03 (×4): qty 1
  Filled 2019-07-03: qty 2
  Filled 2019-07-03 (×5): qty 1
  Filled 2019-07-03 (×2): qty 2

## 2019-07-03 MED ORDER — LORAZEPAM 1 MG PO TABS
1.0000 mg | ORAL_TABLET | ORAL | Status: AC | PRN
  Administered 2019-07-04 – 2019-07-05 (×3): 1 mg via ORAL
  Administered 2019-07-06: 2 mg via ORAL
  Filled 2019-07-03: qty 1
  Filled 2019-07-03: qty 2
  Filled 2019-07-03 (×2): qty 1

## 2019-07-03 MED ORDER — FENTANYL CITRATE (PF) 100 MCG/2ML IJ SOLN
25.0000 ug | Freq: Once | INTRAMUSCULAR | Status: AC
  Administered 2019-07-03: 25 ug via INTRAVENOUS
  Filled 2019-07-03: qty 2

## 2019-07-03 MED ORDER — VITAMIN B-1 100 MG PO TABS
100.0000 mg | ORAL_TABLET | Freq: Every day | ORAL | Status: DC
  Administered 2019-07-04 – 2019-07-15 (×12): 100 mg via ORAL
  Filled 2019-07-03 (×14): qty 1

## 2019-07-03 MED ORDER — SODIUM CHLORIDE 0.9% IV SOLUTION
Freq: Once | INTRAVENOUS | Status: AC
  Administered 2019-07-03: 23:00:00 via INTRAVENOUS

## 2019-07-03 MED ORDER — FOLIC ACID 1 MG PO TABS
1.0000 mg | ORAL_TABLET | Freq: Every day | ORAL | Status: DC
  Administered 2019-07-03 – 2019-07-15 (×13): 1 mg via ORAL
  Filled 2019-07-03 (×13): qty 1

## 2019-07-03 MED ORDER — LORAZEPAM 2 MG/ML IJ SOLN
1.0000 mg | Freq: Once | INTRAMUSCULAR | Status: AC
  Administered 2019-07-03: 1 mg via INTRAVENOUS
  Filled 2019-07-03: qty 1

## 2019-07-03 NOTE — ED Provider Notes (Signed)
[facility] EMERGENCY DEPARTMENT Provider Note   CSN: [id number] Arrival date & time: 07/03/19  1843     History   Chief Complaint Chief Complaint  Patient presents with  . Chest Pain  . Abdominal Pain    HPI Briana Sweeney is a 48 y.o. female.     48 y.o.female,polysubstance abuse,chronic alcoholism, hepatitis C, liver cirrhosis, chronic pancytopenia, bipolar disorder with anxiety presenting today with a complaint of diffuse bruising, sharp stabbing chest pain, left neck pain and pain radiating down into the shoulder and the arm that is been present for the last week.  She intermittently will feel short of breath but nothing seems to make it better.  It is not made worse with exertion or lying down.  She also has mild diffuse abdominal pain but denies any vomiting or diarrhea.  Patient states that she continues to drink 15 beers a day and has had beer this morning despite her severe liver disease.  Patient intermittently will be admitted for severe pancytopenia that in the past seems to respond to IVIG and steroids.  Patient did have a trauma back in May where she fell and hit her head after drinking heavily but denies any recent trauma.  Last paracentesis was last year and she does not get them regularly.  She continues to take her medications for her liver disease.  She last received a platelet transfusion last month in the doctor's office.  She denies cough, congestion, fever.  She is just complaining of feeling very anxious.  No known heart disease.  She does also have a history of withdrawal when she does not have alcohol as well as seizures.  10 days ago she changed from Dilantin to [place].  She has not had any seizures in the last week.   Chest Pain Associated symptoms: abdominal pain   Abdominal Pain Associated symptoms: chest pain     Past Medical History:  Diagnosis Date  . Anxiety   . Bipolar affective disorder ([place])    With anxiety features  .  Cirrhosis of liver ([place])    Due to alcohol and hepatitis C  . Depression   . 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 ETOHism ([place])   . GERD (gastroesophageal reflux disease)   . Hematemesis 02/10/2018  . Hepatitis C 2018   hepatitis c and alcohol related hepatitis  . Heroin abuse ([place])   . History of blood transfusion    "blood doesn't clot; I fell down and had to have a transfusion"  . History of kidney stones   . Menopause 2016  . Migraine    "when I get really stressed" (09/01/2017)  . Schizophrenia ([place])   . Seizures ([place])    "when I run out of my RX; lots recently" (09/01/2017)    Patient Active Problem List   Diagnosis Date Noted  . Insomnia 05/05/2019  . Dizziness 04/02/2019  . Bruising 04/02/2019  . Hyponatremia 04/02/2019  . Head injury   . Scalp laceration, initial encounter   . Fall 01/20/2019  . Epistaxis 01/20/2019  . Altered mental status   . 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 Anxiety 10/27/2018  . Neuropathy 10/27/2018  . Abscess of bursa of left elbow 09/23/2018  . Olecranon bursitis of left elbow   . Erysipelas 09/13/2018  . Acute metabolic encephalopathy 0000000  . Overdose 08/22/2018  . Hypotension 08/22/2018  . Seizure ([place]) 08/22/2018  . Substance induced mood disorder ([place]) 07/17/2018  . Cocaine dependence without  complication ([place]) [id number]  . Polysubstance abuse ([place]) 04/08/2018  . Encephalopathy, 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[place]) 04/08/2018  . Hepatitis C 03/18/2018  . Portal hypertension ([place]) 03/18/2018  . Alcoholic intoxication without complication ([place]) [id number]  . Depression 03/18/2018  . Upper GI bleed 02/10/2018  . Alcohol use disorder, severe, in early remission ([place]) 02/10/2018  . Chronic anemia   . Thrombocytopenia due to drugs   . Suicidal ideation   . Thrombocytopenia ([place]) 01/02/2018  . GERD (gastroesophageal reflux disease) 11/01/2017  . Trichimoniasis 10/30/2017  . Alcohol dependence ([place]) 10/29/2017  . MDD  (major depressive disorder), severe ([place]) 10/28/2017  . Acute hyperactive alcohol withdrawal delirium ([place]) 10/09/2017  . Schizophrenia ([place]) 10/09/2017  . Alcohol abuse with alcohol-induced mood disorder ([place])   . Alcohol withdrawal syndrome with complication ([place]) [id number]  . Esophageal varices without bleeding ([place])   . Hematemesis 09/01/2017  . Ascites due to alcoholic cirrhosis ([place])   . Decompensated hepatic cirrhosis ([place]) 07/23/2017  . Alcohol abuse 07/23/2017  . Hepatitis C antibody positive in blood 07/23/2017  . Hypokalemia 07/23/2017  . Jaundice 07/23/2017  . Coagulopathy ([place]) 07/23/2017  . Hypomagnesemia 07/23/2017  . Pancytopenia ([place]) 07/23/2017  . Alcoholic cirrhosis of liver with ascites ([place]) 07/23/2017  . Bacterial vaginosis 06/03/2017  . UTI (urinary tract infection) 06/02/2017    Past Surgical History:  Procedure Laterality Date  . ESOPHAGOGASTRODUODENOSCOPY N/A 09/03/2017   Procedure: ESOPHAGOGASTRODUODENOSCOPY (EGD);  Surgeon: Doran Stabler, MD;  Location: [facility];  Service: Gastroenterology;  Laterality: N/A;  . FINGER FRACTURE SURGERY Left    "shattered my pinky"  . FRACTURE SURGERY    . I&D EXTREMITY Left 09/18/2018   Procedure: IRRIGATION AND DEBRIDEMENT EXTREMITY;  Surgeon: Leanora Cover, MD;  Location: [facility];  Service: Orthopedics;  Laterality: Left;  . IR PARACENTESIS  07/23/2017  . IR PARACENTESIS  07/2017   "did it twice in the same week" (09/01/2017)  . SHOULDER OPEN ROTATOR CUFF REPAIR Right   . TUBAL LIGATION    . VAGINAL HYSTERECTOMY       OB History   No obstetric history on file.      Home Medications    Prior to Admission medications   Medication Sig Start Date End Date Taking? Authorizing Provider  albuterol (VENTOLIN HFA) 108 (90 Base) MCG/ACT inhaler Inhale 2 puffs into the lungs every 6 (six) hours as needed for wheezing or shortness of breath.   Yes [provider]  alum & mag hydroxide-simeth  (MAALOX/MYLANTA) 200-200-20 MG/5ML suspension Take 15 mLs by mouth every 4 (four) hours as needed for indigestion or heartburn. 04/13/19  Yes Hosie Poisson, MD  ARIPiprazole (ABILIFY) 5 MG tablet Take 1 tablet (5 mg total) by mouth daily. 06/10/19 08/09/19 Yes Pucilowski, Marchia Bond, MD  aspirin 81 MG chewable tablet Chew 324 mg by mouth as needed (for onset of chest pain).   Yes [provider]  FLUoxetine (PROZAC) 40 MG capsule Take 1 capsule (40 mg total) by mouth daily. 04/13/19  Yes Hosie Poisson, MD  folic acid (FOLVITE) 1 MG tablet Take 1 tablet (1 mg total) by mouth daily. 04/13/19  Yes Hosie Poisson, MD  furosemide (LASIX) 40 MG tablet Take 1 tablet (40 mg total) by mouth daily. 04/13/19 12/09/19 Yes Hosie Poisson, MD  gabapentin (NEURONTIN) 300 MG capsule Take 2 capsules 2 times a day. Patient taking differently: Take 600 mg by mouth 2 (two) times daily.  04/13/19  Yes Hosie Poisson, MD  hydrOXYzine (ATARAX/VISTARIL) 25 MG tablet  Take 1 tablet (25 mg total) by mouth every 6 (six) hours as needed for itching. 06/08/19  Yes [name], FNP  lactulose (CHRONULAC) 10 GM/15ML solution Take 30 mLs (20 g total) by mouth 3 (three) times daily. Goal to have 2-3 BMs daily Patient taking differently: Take 20 g by mouth 2 (two) times daily. Goal to have 2-3 BMs daily 04/13/19  Yes Hosie Poisson, MD  levETIRAcetam (KEPPRA) 500 MG tablet Take 1 tablet (500 mg total) by mouth 2 (two) times daily. 06/15/19  Yes [name], FNP  mirtazapine (REMERON) 15 MG tablet Take 1 tablet (15 mg total) by mouth at bedtime. 06/10/19 08/09/19 Yes Pucilowski, Marchia Bond, MD  Multiple Vitamin (MULTIVITAMIN WITH MINERALS) TABS tablet Take 1 tablet by mouth daily. 04/13/19  Yes Hosie Poisson, MD  polyethylene glycol (MIRALAX / GLYCOLAX) 17 g packet Take 17 g by mouth 2 (two) times daily. Patient taking differently: Take 17 g by mouth daily as needed for mild constipation or moderate constipation.  04/13/19  Yes Hosie Poisson,  MD  topiramate (TOPAMAX) 50 MG tablet Take 1 tablet (50 mg total) by mouth daily. 04/13/19  Yes Hosie Poisson, MD  pantoprazole (PROTONIX) 40 MG tablet Take 1 tablet (40 mg total) by mouth 2 (two) times daily. Patient not taking: Reported on 07/03/2019 04/13/19   Hosie Poisson, MD  thiamine 100 MG tablet Take 1 tablet (100 mg total) by mouth daily. Patient not taking: Reported on 07/03/2019 04/14/19   Hosie Poisson, MD    Family History Family History  Problem Relation Age of Onset  . Lung cancer Mother 13  . Alcohol abuse Mother   . Throat cancer Father 5    Social History Social History   Tobacco Use  . Smoking status: Never Smoker  . Smokeless tobacco: Never Used  Substance Use Topics  . Alcohol use: Not Currently    Alcohol/week: 63.0 standard drinks    Types: 63 Cans of beer per week    Comment: weekly "I have cut back"; 4 days 4 vs 12 pack   . Drug use: Yes    Types: Marijuana, Cocaine    Comment: 2 weeks;      Allergies   Pantoprazole   Review of Systems Review of Systems  Cardiovascular: Positive for chest pain.  Gastrointestinal: Positive for abdominal pain.  All other systems reviewed and are negative. 48 y.o.female,polysubstance abuse,chronic alcoholism, hepatitis C, liver cirrhosis, chronic pancytopenia, bipolar disorder with anxiety   Physical Exam Updated Vital Signs BP (!) 128/94   Pulse 94   Temp 98.4 F (36.9 C) (Oral)   Resp 16   Ht 4\' 11"  (1.499 m)   Wt 54.9 kg   SpO2 93%   BMI 24.44 kg/m   Physical Exam Vitals signs and nursing note reviewed.  Constitutional:      General: She is not in acute distress.    Appearance: She is well-developed and normal weight.  HENT:     Head: Normocephalic and atraumatic.  Eyes:     Conjunctiva/sclera: Conjunctivae normal.     Pupils: Pupils are equal, round, and reactive to light.  Neck:     Musculoskeletal: Normal range of motion and neck supple.   Cardiovascular:     Rate and Rhythm: Regular  rhythm. Tachycardia present.     Heart sounds: No murmur.  Pulmonary:     Effort: Pulmonary effort is normal. No respiratory distress.     Breath sounds: Normal breath sounds. No wheezing or  rales.  Abdominal:     General: There is distension.     Palpations: Abdomen is soft.     Tenderness: There is no abdominal tenderness. There is no guarding or rebound.     Comments: Ascites present but abdomen is soft and no localized tenderness  Musculoskeletal: Normal range of motion.        General: No tenderness.     Right lower leg: No edema.     Left lower leg: No edema.  Skin:    General: Skin is warm and dry.     Capillary Refill: Capillary refill takes less than 2 seconds.     Findings: Bruising present. No erythema or rash.     Comments: Diffuse bruising on the upper and lower extremities  Neurological:     General: No focal deficit present.     Mental Status: She is alert and oriented to person, place, and time. Mental status is at baseline.     Cranial Nerves: No cranial nerve deficit.     Sensory: No sensory deficit.     Motor: No weakness.  Psychiatric:        Attention and Perception: Attention normal.        Mood and Affect: Mood is anxious.        Behavior: Behavior normal.      ED Treatments / Results  Labs (all labs ordered are listed, but only abnormal results are displayed) Labs Reviewed  LIPASE, BLOOD - Abnormal; Notable for the following components:      Result Value   Lipase 68 (*)    All other components within normal limits  COMPREHENSIVE METABOLIC PANEL - Abnormal; Notable for the following components:   Sodium 133 (*)    CO2 19 (*)    BUN <5 (*)    Calcium 8.6 (*)    Total Protein 8.6 (*)    Albumin 3.2 (*)    AST 183 (*)    ALT 48 (*)    Alkaline Phosphatase 241 (*)    Total Bilirubin 2.2 (*)    All other components within normal limits  CBC - Abnormal; Notable for the following components:   WBC 2.8 (*)    RDW 19.8 (*)    Platelets 12 (*)     All other components within normal limits  URINALYSIS, ROUTINE W REFLEX MICROSCOPIC  I-STAT BETA HCG BLOOD, ED ([facility], [facility], [facility] ONLY)  TROPONIN I (HIGH SENSITIVITY)  TROPONIN I (HIGH SENSITIVITY)    EKG EKG Interpretation  Date/Time:  Saturday July 03 2019 18:52:12 EDT Ventricular Rate:  102 PR Interval:  164 QRS Duration: 74 QT Interval:  350 QTC Calculation: 456 R Axis:   68 Text Interpretation:  Sinus tachycardia Cannot rule out Anterior infarct , age undetermined No significant change since last tracing Confirmed by Blanchie Dessert [id number]) on 07/03/2019 8:00:54 PM   Radiology Dg Chest 2 View  Result Date: 07/03/2019 CLINICAL DATA:  Chest pain EXAM: CHEST - 2 VIEW COMPARISON:  02/03/2019. FINDINGS: The heart size and mediastinal contours are within normal limits. Calcified left hilar lymph nodes. Both lungs are clear. The visualized skeletal structures are unremarkable. IMPRESSION: No acute abnormality of the lungs. Electronically Signed   By: Eddie Candle M.D.   On: 07/03/2019 20:13    Procedures Procedures (including critical care time)  Medications Ordered in ED Medications  sodium chloride flush (NS) 0.9 % injection 3 mL (0 mLs Intravenous Hold 07/03/19 2028)  LORazepam (ATIVAN) injection 1 mg (has  no administration in time range)     Initial Impression / Assessment and Plan / ED Course  I have reviewed the triage vital signs and the nursing notes.  Pertinent labs & imaging results that were available during my care of the patient were reviewed by me and considered in my medical decision making (see chart for details).        Patient is a 48 year old female with multiple medical problems presenting today with multiple complaints of chest pain, abdominal pain and neck pain.  The neck and shoulder pain seem to be more musculoskeletal in nature with palpable spasm and pain reproduced with certain movements of the neck.  Breath sounds are clear, EKG is unchanged,  initial troponin is 5 despite having symptoms all week.  She also complains of abdominal pain but abdomen is soft and no localized tenderness.  Vital signs are stable.  Low suspicion for ACS, PE or pneumonia.  Patient has no Covid-like symptoms.  Patient does not appear intoxicated at this time and last had beer this morning.  She still continues to drink 15 beers a day and states she does withdrawal if she cannot drink.  Patient is mildly tachycardic between 90 and 105 and has a stable blood pressure.  Chest x-ray without acute findings.  Labs are significant for negative pregnancy, lipase of 68, CMP with elevated LFTs with an AST of 183 and a total bilirubin of 2.2.  CBC with chronic leukopenia of 2.8 and thrombocytopenia with a platelet count of 12 today.  Patient's last platelet transfusion was approximately 1 month ago in her doctor's office.  She has diffuse bruising and in May had similar symptoms that responded to IVIG and steroids.  Will consult oncology prior to starting platelet transfusion.  Patient denies any recent trauma and has no altered mental status or concern for head bleed at this time.  Spoke with Dr. Jana Hakim who recommended 1unit platelet transfusion and recheck 1 hour after transfusion and if not improving then will need IVIG for possible ITP. Will admit for further care. Patient will be started on CIWA to prevent alcohol withdrawal.  CRITICAL CARE Performed by: Traeton Bordas Total critical care time: 30 minutes Critical care time was exclusive of separately billable procedures and treating other patients. Critical care was necessary to treat or prevent imminent or life-threatening deterioration. Critical care was time spent personally by me on the following activities: development of treatment plan with patient and/or surrogate as well as nursing, discussions with consultants, evaluation of patient's response to treatment, examination of patient, obtaining history from patient  or surrogate, ordering and performing treatments and interventions, ordering and review of laboratory studies, ordering and review of radiographic studies, pulse oximetry and re-evaluation of patient's condition.   Final Clinical Impressions(s) / ED Diagnoses   Final diagnoses:  Atypical chest pain  Thrombocytopenia [facility])    ED Discharge Orders    None       Blanchie Dessert, MD 07/03/19 2114

## 2019-07-03 NOTE — ED Triage Notes (Signed)
Pt to triage via [facility]> C/o nausea, vomiting, diarrhea, LLQ, and RUQ pain x 1 week.  History of cirrhosis.  C/o L sided chest pain x 3 days.  Bruising to bilateral arms.  Took 1 NTG, ASA 324 mg, and Zofran 4 mg PTA.

## 2019-07-03 NOTE — ED Notes (Signed)
Dr. Maryan Rued notified of Platelets 12.  Pt moved to treatment room.

## 2019-07-03 NOTE — H&P (Signed)
History and Physical   Priti Mcnulty [id number] DOB: Dec 08, 1970 DOA: 07/03/2019  Referring MD/NP/PA: Dr. Maryan Rued  PCP: [name], FNP   Patient coming from: Home  Chief Complaint: Chest and abdominal pain  HPI: Briana Sweeney is a 48 y.o. 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  Patient has had recurrent thrombocytopenia which required steroids as well as IVIG in the past.  She has had multiple platelet transfusions most recently about a month ago.  She was found to have platelets of 12,000.  She is also tremulous with signs of early alcohol withdrawal.  She drinks about 15 beers a day.  Patient has pain mainly in the neck chest abdomen rated as 8-9 out of 10.  It is dull nonspecific and persistent.  Denies fever or chills.  Denied any contact with anybody who is sick including COVID-19.  Patient seen and evaluated.  Dr. Jana Hakim consulted with recommendation for transfusion of platelets and follow-up labs.  Will be seen by oncology for decision regarding IVIG and steroids.  Will admit inpatient service for management..  ED Course: Temperature 98 for blood pressure 164 123 pulse 131 respiratory of 31 oxygen sats 90% on 2 L.  White count is 2.8 hemoglobin 13 platelets 12,000.  Sodium 133 potassium 3.9 chloride 99 CO2 of 19 calcium 8.6 BUN is less than 5 and creatinine 0.52 glucose 97.  Urinalysis is negative.  Troponin is 5.  Patient is being admitted with expected oncology follow-up.  1 unit of platelets being transfused  Review of Systems: As per HPI otherwise 10 point review of systems negative.    Past Medical History:  Diagnosis Date  . Anxiety   . Bipolar affective disorder ([place])    With anxiety features   . Cirrhosis of liver ([place])    Due to alcohol and hepatitis C  . Depression   . 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 ETOHism ([place])   . GERD (gastroesophageal reflux disease)   . Hematemesis 02/10/2018  . Hepatitis C 2018   hepatitis c and alcohol related hepatitis  . Heroin abuse ([place])   . History of blood transfusion    "blood doesn't clot; I fell down and had to have a transfusion"  . History of kidney stones   . Menopause 2016  . Migraine    "when I get really stressed" (09/01/2017)  . Schizophrenia ([place])   . Seizures ([place])    "when I run out of my RX; lots recently" (09/01/2017)    Past Surgical History:  Procedure Laterality Date  . ESOPHAGOGASTRODUODENOSCOPY N/A 09/03/2017   Procedure: ESOPHAGOGASTRODUODENOSCOPY (EGD);  Surgeon: Doran Stabler, MD;  Location: [facility];  Service: Gastroenterology;  Laterality: N/A;  . FINGER FRACTURE SURGERY Left    "shattered my pinky"  . FRACTURE SURGERY    . I&D EXTREMITY Left 09/18/2018   Procedure: IRRIGATION AND DEBRIDEMENT EXTREMITY;  Surgeon: Leanora Cover, MD;  Location: [facility];  Service: Orthopedics;  Laterality: Left;  . IR PARACENTESIS  07/23/2017  . IR PARACENTESIS  07/2017   "did it twice in the same week" (09/01/2017)  . SHOULDER OPEN ROTATOR CUFF REPAIR Right   . TUBAL LIGATION    . VAGINAL HYSTERECTOMY       reports that she has  never smoked. She has never used smokeless tobacco. She reports previous alcohol use of about 63.0 standard drinks of alcohol per week. She reports current drug use. Drugs: Marijuana and Cocaine.  Allergies  Allergen Reactions  . Pantoprazole Nausea Only and Other (See Comments)    "This made my stomach burn"    Family History  Problem Relation Age of Onset  . Lung cancer Mother 12  . Alcohol abuse Mother   . Throat cancer Father 69     Prior to Admission medications   Medication Sig Start Date End Date Taking? Authorizing Provider  albuterol (VENTOLIN HFA) 108 (90 Base)  MCG/ACT inhaler Inhale 2 puffs into the lungs every 6 (six) hours as needed for wheezing or shortness of breath.   Yes [provider]  alum & mag hydroxide-simeth (MAALOX/MYLANTA) 200-200-20 MG/5ML suspension Take 15 mLs by mouth every 4 (four) hours as needed for indigestion or heartburn. 04/13/19  Yes Hosie Poisson, MD  ARIPiprazole (ABILIFY) 5 MG tablet Take 1 tablet (5 mg total) by mouth daily. 06/10/19 08/09/19 Yes Pucilowski, Marchia Bond, MD  aspirin 81 MG chewable tablet Chew 324 mg by mouth as needed (for onset of chest pain).   Yes [provider]  busPIRone (BUSPAR) 10 MG tablet Take 10 mg by mouth 2 (two) times daily.   Yes [provider]  FLUoxetine (PROZAC) 40 MG capsule Take 1 capsule (40 mg total) by mouth daily. 04/13/19  Yes Hosie Poisson, MD  folic acid (FOLVITE) 1 MG tablet Take 1 tablet (1 mg total) by mouth daily. 04/13/19  Yes Hosie Poisson, MD  furosemide (LASIX) 40 MG tablet Take 1 tablet (40 mg total) by mouth daily. 04/13/19 12/09/19 Yes Hosie Poisson, MD  gabapentin (NEURONTIN) 300 MG capsule Take 2 capsules 2 times a day. Patient taking differently: Take 600 mg by mouth 2 (two) times daily.  04/13/19  Yes Hosie Poisson, MD  hydrOXYzine (ATARAX/VISTARIL) 25 MG tablet Take 1 tablet (25 mg total) by mouth every 6 (six) hours as needed for itching. 06/08/19  Yes [name], FNP  lactulose (CHRONULAC) 10 GM/15ML solution Take 30 mLs (20 g total) by mouth 3 (three) times daily. Goal to have 2-3 BMs daily Patient taking differently: Take 20 g by mouth 2 (two) times daily. Take 20 grams (30 ml's) by mouth two times a day- goal to have 2-3 BMs daily 04/13/19  Yes Hosie Poisson, MD  levETIRAcetam (KEPPRA) 500 MG tablet Take 1 tablet (500 mg total) by mouth 2 (two) times daily. 06/15/19  Yes [name], FNP  LORazepam (ATIVAN) 1 MG tablet Take 1 mg by mouth every 8 (eight) hours as needed for anxiety.   Yes [provider]  mirtazapine (REMERON) 15 MG  tablet Take 1 tablet (15 mg total) by mouth at bedtime. 06/10/19 08/09/19 Yes Pucilowski, Marchia Bond, MD  Multiple Vitamin (MULTIVITAMIN WITH MINERALS) TABS tablet Take 1 tablet by mouth daily. 04/13/19  Yes Hosie Poisson, MD  polyethylene glycol (MIRALAX / GLYCOLAX) 17 g packet Take 17 g by mouth 2 (two) times daily. Patient taking differently: Take 17 g by mouth daily as needed for mild constipation or moderate constipation.  04/13/19  Yes Hosie Poisson, MD  topiramate (TOPAMAX) 50 MG tablet Take 1 tablet (50 mg total) by mouth daily. 04/13/19  Yes Hosie Poisson, MD  pantoprazole (PROTONIX) 40 MG tablet Take 1 tablet (40 mg total) by mouth 2 (two) times daily. Patient not taking: Reported on 07/03/2019 04/13/19  Hosie Poisson, MD  thiamine 100 MG tablet Take 1 tablet (100 mg total) by mouth daily. Patient not taking: Reported on 07/03/2019 04/14/19   Hosie Poisson, MD    Physical Exam: Vitals:   07/03/19 2030 07/03/19 2045 07/03/19 2100 07/03/19 2115  BP: 118/88 (!) 128/94 (!) 121/93 (!) 164/123  Pulse: 88 94 92 90  Resp: 20 16 16 16   Temp:      TempSrc:      SpO2: 94% 93% 96% 94%  Weight:      Height:          Constitutional: Small frame, tremulous, chronically ill looking Vitals:   07/03/19 2030 07/03/19 2045 07/03/19 2100 07/03/19 2115  BP: 118/88 (!) 128/94 (!) 121/93 (!) 164/123  Pulse: 88 94 92 90  Resp: 20 16 16 16   Temp:      TempSrc:      SpO2: 94% 93% 96% 94%  Weight:      Height:       Eyes: PERRL, lids and conjunctivae jaundiced ENMT: Mucous membranes are moist. Posterior pharynx clear of any exudate or lesions.Normal dentition.  Neck: normal, supple, no masses, no thyromegaly Respiratory: clear to auscultation bilaterally, no wheezing, no crackles. Normal respiratory effort. No accessory muscle use.  Cardiovascular: Regular rate and rhythm, no murmurs / rubs / gallops. No extremity edema. 2+ pedal pulses. No carotid bruits.  Abdomen: Positive ascites, no tenderness, no  masses palpated. No hepatosplenomegaly. Bowel sounds positive.  Musculoskeletal: no clubbing / cyanosis. No joint deformity upper and lower extremities. Good ROM, no contractures. Normal muscle tone.  Skin: Diffuse bruises throughout the trunk and limbs ulcers. No induration Neurologic: CN 2-12 grossly intact. Sensation intact, DTR normal. Strength 5/5 in all 4.  Psychiatric: Normal judgment and insight. Alert and oriented x 3.  Very anxious and tremulous    Labs on Admission: I have personally reviewed following labs and imaging studies  CBC: Recent Labs  Lab 07/03/19 1851  WBC 2.8*  HGB 13.0  HCT 38.3  MCV 91.4  PLT 12*   Basic Metabolic Panel: Recent Labs  Lab 07/03/19 1851  NA 133*  K 3.9  CL 99  CO2 19*  GLUCOSE 97  BUN <5*  CREATININE 0.52  CALCIUM 8.6*   GFR: Estimated Creatinine Clearance: 65.7 mL/min (by C-G formula based on SCr of 0.52 mg/dL). Liver Function Tests: Recent Labs  Lab 07/03/19 1851  AST 183*  ALT 48*  ALKPHOS 241*  BILITOT 2.2*  PROT 8.6*  ALBUMIN 3.2*   Recent Labs  Lab 07/03/19 1851  LIPASE 68*   No results for input(s): AMMONIA in the last 168 hours. Coagulation Profile: No results for input(s): INR, PROTIME in the last 168 hours. Cardiac Enzymes: No results for input(s): CKTOTAL, CKMB, CKMBINDEX, TROPONINI in the last 168 hours. BNP (last 3 results) No results for input(s): PROBNP in the last 8760 hours. HbA1C: No results for input(s): HGBA1C in the last 72 hours. CBG: No results for input(s): GLUCAP in the last 168 hours. Lipid Profile: No results for input(s): CHOL, HDL, LDLCALC, TRIG, CHOLHDL, LDLDIRECT in the last 72 hours. Thyroid Function Tests: No results for input(s): TSH, T4TOTAL, FREET4, T3FREE, THYROIDAB in the last 72 hours. Anemia Panel: No results for input(s): VITAMINB12, FOLATE, FERRITIN, TIBC, IRON, RETICCTPCT in the last 72 hours. Urine analysis:    Component Value Date/Time   COLORURINE YELLOW  04/11/2019 1823   APPEARANCEUR CLOUDY (A) 04/11/2019 1823   LABSPEC 1.009 04/11/2019 1823   PHURINE  7.0 04/11/2019 1823   GLUCOSEU NEGATIVE 04/11/2019 1823   HGBUR NEGATIVE 04/11/2019 1823   BILIRUBINUR NEGATIVE 04/11/2019 1823   BILIRUBINUR negative 10/23/2018 1302   BILIRUBINUR neg 02/20/2018 1248   [place] 04/11/2019 1823   PROTEINUR NEGATIVE 04/11/2019 1823   UROBILINOGEN 1.0 10/23/2018 1302   UROBILINOGEN 0.2 12/02/2017 0836   NITRITE NEGATIVE 04/11/2019 1823   LEUKOCYTESUR NEGATIVE 04/11/2019 1823   Sepsis Labs: @LABRCNTIP (procalcitonin:4,lacticidven:4) )No results found for this or any previous visit (from the past 240 hour(s)).   Radiological Exams on Admission: Dg Chest 2 View  Result Date: 07/03/2019 CLINICAL DATA:  Chest pain EXAM: CHEST - 2 VIEW COMPARISON:  02/03/2019. FINDINGS: The heart size and mediastinal contours are within normal limits. Calcified left hilar lymph nodes. Both lungs are clear. The visualized skeletal structures are unremarkable. IMPRESSION: No acute abnormality of the lungs. Electronically Signed   By: Eddie Candle M.D.   On: 07/03/2019 20:13      Assessment/Plan Principal Problem:   Thrombocytopenia ([place]) Active Problems:   Alcohol abuse   Alcoholic cirrhosis of liver with ascites (HCC)   Schizophrenia (HCC)   GERD (gastroesophageal reflux disease)   Polysubstance abuse ([place])     #1 recurrent thrombocytopenia: Patient will receive 1 unit of platelets.  Follow-up with recheck platelets an hour later.  Hematology consult with potential IVIG and steroids infusion if no improvement.  #2 alcoholic cirrhosis: Appears advanced with some ascites.  Continue monitor  #3 alcohol intoxication with early withdrawals: CIWA protocol.  Folic acid and thiamine.  #4 GERD: Continue with PPIs  #5 schizophrenia: Stable.  Resume home regimen   DVT prophylaxis: SCD Code Status: Full code Family Communication: No family at bedside  Disposition Plan: To be determined Consults called: Dr. Jana Hakim, hematology Admission status: Inpatient to progressive care  Severity of Illness: The appropriate patient status for this patient is INPATIENT. Inpatient status is judged to be reasonable and necessary in order to provide the required intensity of service to ensure the patient's safety. The patient's presenting symptoms, physical exam findings, and initial radiographic and laboratory data in the context of their chronic comorbidities is felt to place them at high risk for further clinical deterioration. Furthermore, it is not anticipated that the patient will be medically stable for discharge from the hospital within 2 midnights of admission. The following factors support the patient status of inpatient.   " The patient's presenting symptoms include no specific abdominal pain. " The worrisome physical exam findings include small framed tremulous with purpura. " The initial radiographic and laboratory data are worrisome because of platelets of 12,000. " The chronic co-morbidities include liver cirrhosis.   * I certify that at the point of admission it is my clinical judgment that the patient will require inpatient hospital care spanning beyond 2 midnights from the point of admission due to high intensity of service, high risk for further deterioration and high frequency of surveillance required.Barbette Merino MD [facility] Pager [phone]  If 7PM-7AM, please contact night-coverage [email or web address] Password [facility]  07/03/2019, 9:51 PM

## 2019-07-04 DIAGNOSIS — F1020 Alcohol dependence, uncomplicated: Secondary | ICD-10-CM

## 2019-07-04 DIAGNOSIS — D693 Immune thrombocytopenic purpura: Secondary | ICD-10-CM

## 2019-07-04 LAB — COMPREHENSIVE METABOLIC PANEL
ALT: 42 U/L (ref 0–44)
AST: 181 U/L — ABNORMAL HIGH (ref 15–41)
Albumin: 2.7 g/dL — ABNORMAL LOW (ref 3.5–5.0)
Alkaline Phosphatase: 200 U/L — ABNORMAL HIGH (ref 38–126)
Anion gap: 16 — ABNORMAL HIGH (ref 5–15)
BUN: <5 mg/dL — ABNORMAL LOW (ref 6–20)
CO2: 22 mmol/L (ref 22–32)
Calcium: 8.4 mg/dL — ABNORMAL LOW (ref 8.9–10.3)
Chloride: 102 mmol/L (ref 98–111)
Creatinine, Ser: 0.72 mg/dL (ref 0.44–1.00)
GFR calc Af Amer: >60 mL/min (ref >60)
GFR calc non Af Amer: >60 mL/min (ref >60)
Glucose, Bld: 97 mg/dL (ref 70–99)
Potassium: 3.4 mmol/L — ABNORMAL LOW (ref 3.5–5.1)
Sodium: 140 mmol/L (ref 135–145)
Total Bilirubin: 2.8 mg/dL — ABNORMAL HIGH (ref 0.3–1.2)
Total Protein: 7.4 g/dL (ref 6.5–8.1)

## 2019-07-04 LAB — CBC
HCT: 34.4 % — ABNORMAL LOW (ref 36.0–46.0)
HCT: 33.4 % — ABNORMAL LOW (ref 36.0–46.0)
Hemoglobin: 11.6 g/dL — ABNORMAL LOW (ref 12.0–15.0)
Hemoglobin: 11.3 g/dL — ABNORMAL LOW (ref 12.0–15.0)
MCH: 31.1 pg (ref 26.0–34.0)
MCH: 31.7 pg (ref 26.0–34.0)
MCHC: 33.7 g/dL (ref 30.0–36.0)
MCHC: 33.8 g/dL (ref 30.0–36.0)
MCV: 92.2 fL (ref 80.0–100.0)
MCV: 93.8 fL (ref 80.0–100.0)
Platelets: 5 10*3/uL — CL (ref 150–400)
Platelets: 16 10*3/uL — CL (ref 150–400)
RBC: 3.73 MIL/uL — ABNORMAL LOW (ref 3.87–5.11)
RBC: 3.56 MIL/uL — ABNORMAL LOW (ref 3.87–5.11)
RDW: 20.3 % — ABNORMAL HIGH (ref 11.5–15.5)
RDW: 19.9 % — ABNORMAL HIGH (ref 11.5–15.5)
WBC: 1.3 10*3/uL — CL (ref 4.0–10.5)
WBC: 3.5 10*3/uL — ABNORMAL LOW (ref 4.0–10.5)
nRBC: 0 % (ref 0.0–0.2)
nRBC: 0 % (ref 0.0–0.2)

## 2019-07-04 LAB — CBC WITH DIFFERENTIAL/PLATELET
Abs Immature Granulocytes: 0 10*3/uL (ref 0.00–0.07)
Basophils Absolute: 0 10*3/uL (ref 0.0–0.1)
Basophils Relative: 0 %
Eosinophils Absolute: 0 10*3/uL (ref 0.0–0.5)
Eosinophils Relative: 0 %
HCT: 37.5 % (ref 36.0–46.0)
Hemoglobin: 12.9 g/dL (ref 12.0–15.0)
Immature Granulocytes: 0 %
Lymphocytes Relative: 14 %
Lymphs Abs: 0.1 10*3/uL — ABNORMAL LOW (ref 0.7–4.0)
MCH: 31.5 pg (ref 26.0–34.0)
MCHC: 34.4 g/dL (ref 30.0–36.0)
MCV: 91.5 fL (ref 80.0–100.0)
Monocytes Absolute: 0 10*3/uL — ABNORMAL LOW (ref 0.1–1.0)
Monocytes Relative: 2 %
Neutro Abs: 0.5 10*3/uL — ABNORMAL LOW (ref 1.7–7.7)
Neutrophils Relative %: 84 %
Platelets: 5 10*3/uL — CL (ref 150–400)
RBC: 4.1 MIL/uL (ref 3.87–5.11)
RDW: 20.1 % — ABNORMAL HIGH (ref 11.5–15.5)
WBC: 0.6 10*3/uL — CL (ref 4.0–10.5)
nRBC: 0 % (ref 0.0–0.2)

## 2019-07-04 LAB — PREPARE PLATELET PHERESIS: Unit division: 0

## 2019-07-04 LAB — BPAM PLATELET PHERESIS
Blood Product Expiration Date: 202010252133
ISSUE DATE / TIME: 202010242236
Unit Type and Rh: 6200

## 2019-07-04 LAB — CBG MONITORING, ED: Glucose-Capillary: 154 mg/dL — ABNORMAL HIGH (ref 70–99)

## 2019-07-04 LAB — SARS CORONAVIRUS 2 (TAT 6-24 HRS): SARS Coronavirus 2: NEGATIVE

## 2019-07-04 MED ORDER — SODIUM CHLORIDE 0.9 % IV SOLN
8.0000 mg/h | INTRAVENOUS | Status: DC
  Administered 2019-07-04 – 2019-07-06 (×4): 8 mg/h via INTRAVENOUS
  Filled 2019-07-04 (×6): qty 80

## 2019-07-04 MED ORDER — SODIUM CHLORIDE 0.9 % IV SOLN
8.0000 mg/h | INTRAVENOUS | Status: DC
  Filled 2019-07-04: qty 80

## 2019-07-04 MED ORDER — DIPHENHYDRAMINE HCL 25 MG PO CAPS
25.0000 mg | ORAL_CAPSULE | Freq: Every day | ORAL | Status: AC | PRN
  Administered 2019-07-04 – 2019-07-05 (×2): 25 mg via ORAL
  Filled 2019-07-04 (×2): qty 1

## 2019-07-04 MED ORDER — IMMUNE GLOBULIN (HUMAN) 10 GM/100ML IV SOLN
1.0000 g/kg | INTRAVENOUS | Status: AC
  Administered 2019-07-04 – 2019-07-05 (×2): 55 g via INTRAVENOUS
  Filled 2019-07-04: qty 550
  Filled 2019-07-04: qty 50

## 2019-07-04 MED ORDER — ONDANSETRON HCL 4 MG PO TABS
4.0000 mg | ORAL_TABLET | Freq: Four times a day (QID) | ORAL | Status: DC | PRN
  Filled 2019-07-04: qty 1

## 2019-07-04 MED ORDER — METHYLPREDNISOLONE SODIUM SUCC 125 MG IJ SOLR
60.0000 mg | Freq: Once | INTRAMUSCULAR | Status: AC
  Administered 2019-07-04: 60 mg via INTRAVENOUS
  Filled 2019-07-04: qty 2

## 2019-07-04 MED ORDER — MIRTAZAPINE 15 MG PO TABS
15.0000 mg | ORAL_TABLET | Freq: Once | ORAL | Status: AC
  Administered 2019-07-05: 15 mg via ORAL
  Filled 2019-07-04: qty 1

## 2019-07-04 MED ORDER — ONDANSETRON HCL 4 MG/2ML IJ SOLN
4.0000 mg | Freq: Four times a day (QID) | INTRAMUSCULAR | Status: DC | PRN
  Administered 2019-07-04 – 2019-07-11 (×5): 4 mg via INTRAVENOUS
  Filled 2019-07-04 (×6): qty 2

## 2019-07-04 MED ORDER — SODIUM CHLORIDE 0.9% IV SOLUTION
Freq: Once | INTRAVENOUS | Status: AC
  Administered 2019-07-04: 100 mL/h via INTRAVENOUS

## 2019-07-04 MED ORDER — PREDNISONE 50 MG PO TABS
60.0000 mg | ORAL_TABLET | Freq: Every day | ORAL | Status: AC
  Administered 2019-07-04 – 2019-07-12 (×9): 60 mg via ORAL
  Filled 2019-07-04 (×3): qty 1
  Filled 2019-07-04: qty 3
  Filled 2019-07-04 (×5): qty 1

## 2019-07-04 MED ORDER — ACETAMINOPHEN 325 MG PO TABS
650.0000 mg | ORAL_TABLET | Freq: Four times a day (QID) | ORAL | Status: DC | PRN
  Administered 2019-07-05 – 2019-07-07 (×2): 650 mg via ORAL
  Filled 2019-07-04 (×3): qty 2

## 2019-07-04 MED ORDER — ACETAMINOPHEN 650 MG RE SUPP
650.0000 mg | Freq: Four times a day (QID) | RECTAL | Status: DC | PRN
  Administered 2019-07-04 (×2): 650 mg via RECTAL
  Filled 2019-07-04 (×2): qty 1

## 2019-07-04 MED ORDER — SODIUM CHLORIDE 0.9 % IV SOLN
80.0000 mg | Freq: Once | INTRAVENOUS | Status: DC
  Filled 2019-07-04: qty 80

## 2019-07-04 NOTE — ED Notes (Signed)
Platelet transfusion started.

## 2019-07-04 NOTE — ED Notes (Signed)
The Pt. is O + blood type and O+ platelets were administered previously. On the thrird run of platelets it was noted that the lab had given A- platelets instead of O+. Spoke to Santiago Glad in the lab who advised that the blood type did not matter with platelets and that correct  blood type primarily concerned  plasma and blood. Santiago Glad in blood bank advised it was ok to give the A- platelets.

## 2019-07-04 NOTE — ED Notes (Signed)
Patient is not having any behavioral or safety needs. Safety sitter order discontinued at this.

## 2019-07-04 NOTE — ED Notes (Signed)
Clear liquid lunch tray ordered 

## 2019-07-04 NOTE — ED Notes (Signed)
Patient requested something to drink. Tolerated will, no vomiting.

## 2019-07-04 NOTE — ED Notes (Signed)
Ordered breakfast--Candus Braud 

## 2019-07-04 NOTE — Progress Notes (Signed)
Hospitalist progress note  If 7PM-7AM,  night-coverage-look on AMION -prefer pages-not epic chat,please  Nury Nebergall  HYI:[id number] DOB: 03-03-1971 DOA: 07/03/2019 PCP: [name], FNP  Narrative:  33 white female chronic ethanolism 15 beers a day/vodka, untreated hep C, liver cirrhosis, bipolar, coffee-ground emesis 11/2018- oncology previously saw her 2019 03/2019 no response to platelet transfusion?  Immune-based thrombocytopenia Admitted c stabbing chest neck and back pain felt to be musculoskeletal in nature LFTs slightly elevated alk phos 200 AST/ALT 181/42 >usual 40/18 Bilirubin 2.8 up from 1.1 WBC 1.3 platelets 5 hemoglobin 11.6 Oncology consulted to assist with management Given 1 unit of platelets placed on CIWA protocol  Assessment & Plan: Severe thrombocytopenia Discussed with Dr. Jana Hakim he will review the patient's case and it looks like we will try IVIG as well as IV steroids as per his direction--appears to have received 3 units of platelets 1 dose of Solu-Medrol We will repeat labs 1300 Tachycardia Likely secondary to withdrawal some blood loss anemia anemia-repeat labs again as below transfuse if below 7 Place on saline 100 cc/h Probable upper GI bleed from probable alcoholic gastritis-Prior GI bleed 11/2018 Patient had episode of coffee-ground emesis-I have stopped her diet and let her have only clear liquids-we will place on PPI drip with no bolus if she has further nausea vomiting with blood superimposed we will need to involve GI this happened in 11/2018 and it does not appear patient was scoped at that time-it does not seem that the patient has abdominal pain so I do not think this is a MW tear Cirrhosis hep C versus alcoholic with superimposed alcoholic hepatitis Cessation of alcohol is integral Hepatitis C-process of getting management-May need to have cessation prior to discussion about Alcoholism at risk for DTs CIWA was 26 earlier today it is now after a dose  of Ativan down to 9-we will continue Ativan protocol and monitor later on today Schizophrenia At this time holding BuSpar 10 twice daily Abilify 5 daily Keppra 500 twice daily Topamax 50 daily Remeron 15 at bedtime Atarax 25 every 6 gabapentin 600 twice daily and we will resume slowly Pancytopenia Likely secondary to toxic effect of alcohol monitor trends  DVT prophylaxis: Needs SCDs  Code Status:   None   Family Communication:   Presumed full Disposition Plan: Inpatient  Consultants:   Oncologist Procedures:   None Antimicrobials:   No Subjective: Awake slightly confused and drinking well approximated with coffee creamer and states hungry Mouth stain dark red blood  Objective: Vitals:   07/04/19 0645 07/04/19 0700 07/04/19 0716 07/04/19 0735  BP: 118/89 120/80 115/85 121/84  Pulse: (!) 103 (!) 103 (!) 108 (!) 107  Resp: (!) 25 (!) 28 (!) 24 (!) 23  Temp: 99.1 F (37.3 C)  99 F (37.2 C) 100 F (37.8 C)  TempSrc: Oral  Oral Oral  SpO2: 97% 95% 94% 93%  Weight:      Height:        Intake/Output Summary (Last 24 hours) at 07/04/2019 0804 Last data filed at 07/04/2019 0630 Gross per 24 hour  Intake 586 ml  Output -  Net 586 ml   Filed Weights   07/03/19 1852  Weight: 54.9 kg    Examination: Confused no distress EOMI icteric chest clear no rales rhonchi S1-S2 no murmur rub or gallop No thyromegaly S1-S2 tachycardic No lower extremity edema Neurologically intact no focal deficit Skin soft supple with some purpuric spots Follows commands   Data Reviewed: I have personally reviewed  following labs and imaging studies CBC: Recent Labs  Lab 07/03/19 1851 07/04/19 0212 07/04/19 0439  WBC 2.8* 0.6* 1.3*  NEUTROABS  --  0.5*  --   HGB 13.0 12.9 11.6*  HCT 38.3 37.5 34.4*  MCV 91.4 91.5 92.2  PLT 12* 5* 5*   Basic Metabolic Panel: Recent Labs  Lab 07/03/19 1851 07/04/19 0439  NA 133* 140  K 3.9 3.4*  CL 99 102  CO2 19* 22  GLUCOSE 97 97  BUN <5*  <5*  CREATININE 0.52 0.72  CALCIUM 8.6* 8.4*   GFR: Estimated Creatinine Clearance: 65.7 mL/min (by C-G formula based on SCr of 0.72 mg/dL). Liver Function Tests: Recent Labs  Lab 07/03/19 1851 07/04/19 0439  AST 183* 181*  ALT 48* 42  ALKPHOS 241* 200*  BILITOT 2.2* 2.8*  PROT 8.6* 7.4  ALBUMIN 3.2* 2.7*   Recent Labs  Lab 07/03/19 1851  LIPASE 68*   No results for input(s): AMMONIA in the last 168 hours. Coagulation Profile: No results for input(s): INR, PROTIME in the last 168 hours. Cardiac Enzymes: Radiology Studies: Reviewed images personally in health database  Scheduled Meds: . sodium chloride   Intravenous Once  . folic acid  1 mg Oral Daily  . multivitamin with minerals  1 tablet Oral Daily  . sodium chloride flush  3 mL Intravenous Once  . thiamine  100 mg Oral Daily   Or  . thiamine  100 mg Intravenous Daily   Continuous Infusions:  LOS: 1 day   Time spent: [place], MD [facility]  07/04/2019, 8:04 AM

## 2019-07-04 NOTE — Progress Notes (Signed)
Pt requesting her nightly dose of remeron. Her home  medications were not started at time of admission and she is becoming very anxious. States she feels funny cause she hasnt taken her meds. Previously notified MD about medications and an oder was placed for day shift MD to address home medications. Have repaged to see if we can get a one time order for her remeron. Will continue to monitor.   Update: MD gave one time order for Remeron. Will administer and continue to monitor patient.

## 2019-07-04 NOTE — ED Notes (Signed)
MD at bedside. 

## 2019-07-04 NOTE — Progress Notes (Signed)
Freyja Doose   DOB:11-15-1970   [id number]   [id number]  Subjective:  Ms Guyon is a 48 year old [place] woman followed by Dr. Marin Olp for severe thrombocytopenia.  The patient does have a history of alcohol abuse and since alcohol is toxic to the marrow that can complicate assessment of response, but she has received steroids, IVIG, and Romiplostim (a thrombopoietin receptor agonist) with some evidence of response (platelet count 04/13/2019 was 66,000.  She is now admitted with hematochezia and a platelet count of less than 5000, which did not correct after platelet transfusion (repeat count 1 hour after transfusion was still less than 5000).  Advanced directives: The patient does not have a healthcare power of attorney but intends to name her sister Cheri Rayle.  She can be reached at [phone].  Objective: Middle-aged white woman examined in bed Vitals:   07/04/19 0939 07/04/19 0940  BP:    Pulse:    Resp:  19  Temp:    SpO2: 95%     Body mass index is 24.44 kg/m.  Intake/Output Summary (Last 24 hours) at 07/04/2019 0945 Last data filed at 07/04/2019 0915 Gross per 24 hour  Intake 882 ml  Output -  Net 882 ml     Sclerae unicteric  Lungs no rales or wheezes--auscultated anterolaterally  Heart regular rate and rhythm  Abdomen soft, +BS  Neuro nonfocal  Breast exam: Deferred   CBG (last 3)  No results for input(s): GLUCAP in the last 72 hours.   Labs:  Lab Results  Component Value Date   WBC 1.3 (LL) 07/04/2019   HGB 11.6 (L) 07/04/2019   HCT 34.4 (L) 07/04/2019   MCV 92.2 07/04/2019   PLT 5 (LL) 07/04/2019   NEUTROABS 0.5 (L) 07/04/2019    @LASTCHEMISTRY @  Urine Studies No results for input(s): UHGB, CRYS in the last 72 hours.  Invalid input(s): UACOL, UAPR, USPG, UPH, UTP, UGL, UKET, UBIL, UNIT, UROB, ULEU, UEPI, UWBC, URBC, UBAC, CAST, UCOM, BILUA  Basic Metabolic Panel: Recent Labs  Lab 07/03/19 1851 07/04/19 0439  NA 133* 140  K 3.9  3.4*  CL 99 102  CO2 19* 22  GLUCOSE 97 97  BUN <5* <5*  CREATININE 0.52 0.72  CALCIUM 8.6* 8.4*   GFR Estimated Creatinine Clearance: 65.7 mL/min (by C-G formula based on SCr of 0.72 mg/dL). Liver Function Tests: Recent Labs  Lab 07/03/19 1851 07/04/19 0439  AST 183* 181*  ALT 48* 42  ALKPHOS 241* 200*  BILITOT 2.2* 2.8*  PROT 8.6* 7.4  ALBUMIN 3.2* 2.7*   Recent Labs  Lab 07/03/19 1851  LIPASE 68*   No results for input(s): AMMONIA in the last 168 hours. Coagulation profile No results for input(s): INR, PROTIME in the last 168 hours.  CBC: Recent Labs  Lab 07/03/19 1851 07/04/19 0212 07/04/19 0439  WBC 2.8* 0.6* 1.3*  NEUTROABS  --  0.5*  --   HGB 13.0 12.9 11.6*  HCT 38.3 37.5 34.4*  MCV 91.4 91.5 92.2  PLT 12* 5* 5*   Cardiac Enzymes: No results for input(s): CKTOTAL, CKMB, CKMBINDEX, TROPONINI in the last 168 hours. BNP: Invalid input(s): POCBNP CBG: No results for input(s): GLUCAP in the last 168 hours. D-Dimer No results for input(s): DDIMER in the last 72 hours. Hgb A1c No results for input(s): HGBA1C in the last 72 hours. Lipid Profile No results for input(s): CHOL, HDL, LDLCALC, TRIG, CHOLHDL, LDLDIRECT in the last 72 hours. Thyroid function studies No results for input(s): TSH, T4TOTAL, T3FREE,  THYROIDAB in the last 72 hours.  Invalid input(s): FREET3 Anemia work up No results for input(s): VITAMINB12, FOLATE, FERRITIN, TIBC, IRON, RETICCTPCT in the last 72 hours. Microbiology Recent Results (from the past 240 hour(s))  SARS CORONAVIRUS 2 (TAT 6-24 HRS) Nasopharyngeal Nasopharyngeal Swab     Status: None   Collection Time: 07/03/19 10:01 PM   Specimen: Nasopharyngeal Swab  Result Value Ref Range Status   SARS Coronavirus 2 NEGATIVE NEGATIVE Final    Comment: (NOTE) SARS-CoV-2 target nucleic acids are NOT DETECTED. The SARS-CoV-2 RNA is generally detectable in upper and lower respiratory specimens during the acute phase of infection.  Negative results do not preclude SARS-CoV-2 infection, do not rule out co-infections with other pathogens, and should not be used as the sole basis for treatment or other patient management decisions. Negative results must be combined with clinical observations, patient history, and epidemiological information. The expected result is Negative. Fact Sheet for Patients: SugarRoll.be Fact Sheet for Healthcare Providers: https://www.woods-mathews.com/ This test is not yet approved or cleared by the [place] FDA and  has been authorized for detection and/or diagnosis of SARS-CoV-2 by FDA under an Emergency Use Authorization (EUA). This EUA will remain  in effect (meaning this test can be used) for the duration of the COVID-19 declaration under Section 56 4(b)(1) of the Act, 21 U.S.C. section 360bbb-3(b)(1), unless the authorization is terminated or revoked sooner. Performed at [facility], [place] [address], [place], [place] [address]       Studies:  Dg Chest 2 View  Result Date: 07/03/2019 CLINICAL DATA:  Chest pain EXAM: CHEST - 2 VIEW COMPARISON:  02/03/2019. FINDINGS: The heart size and mediastinal contours are within normal limits. Calcified left hilar lymph nodes. Both lungs are clear. The visualized skeletal structures are unremarkable. IMPRESSION: No acute abnormality of the lungs. Electronically Signed   By: Eddie Candle M.D.   On: 07/03/2019 20:13   REVIEW OF PERIPHERAL BLOOD FILM: There are no schistocytes.  There is no significant anisocytosis.  There are mild rouleaux.  There are no artifactual platelet counts and platelets are essentially absent, rare large platelet noted.  White cells were low but otherwise unremarkable.    Assessment: 48 y.o. [place] woman with a history of immune thrombocytopenia, ongoing alcohol abuse.  Plan:  Ms. Caputa has had some response to standard treatment for immune thrombocytopenia in the  past.  At this point, with no evidence of DIC or TTP, she should be started on steroids and received IVIG.  Response to these agents may be compromised by her ongoing alcohol intake.  She most recently drank yesterday, 07/03/2019.  I would anticipate between 5 and 10 days from now the patient's platelet count may have responded sufficiently for her to be safely discharged home.  I discussed alcoholics anonymous with the patient who tells me she is not interested in a referral.  She does not have a healthcare power of attorney and I have placed a referral to our chaplain service to try to formalize that for her.  I will alert Dr. Marin Olp to her admission.  Please let me know if I can be of further help  Chauncey Cruel, MD 07/04/2019  9:45 AM Medical Oncology and Hematology [facility] [address] [place], [place] [address] Tel. [phone]    Fax. [phone]

## 2019-07-04 NOTE — ED Notes (Signed)
Pt sitting up eating breakfast. Alert and oriented X4

## 2019-07-04 NOTE — Progress Notes (Signed)
Seen briefly in ED Seems to be stabilizing-CIWA for last several checks lower in 6-11 range Await CBC getting IVIG Ok for SDU  Verneita Griffes, MD [facility] 2:48 PM

## 2019-07-04 NOTE — ED Notes (Signed)
ED TO INPATIENT HANDOFF REPORT  ED Nurse Name and Phone #: Lovell Sheehan [phone]  S Name/Age/Gender Briana Sweeney 48 y.o. female Room/Bed: 044C/044C  Code Status   Code Status: Full Code  Home/SNF/Other Home Patient oriented to: self, place, time and situation Is this baseline? Yes   Triage Complete: Triage complete  Chief Complaint CP, Abd pain  Triage Note Pt to triage via [facility]> C/o nausea, vomiting, diarrhea, LLQ, and RUQ pain x 1 week.  History of cirrhosis.  C/o L sided chest pain x 3 days.  Bruising to bilateral arms.  Took 1 NTG, ASA 324 mg, and Zofran 4 mg PTA.   Allergies Allergies  Allergen Reactions  . Pantoprazole Nausea Only and Other (See Comments)    "This made my stomach burn"    Level of Care/Admitting Diagnosis ED Disposition    ED Disposition Condition [place]: [place] [[id number]]  Level of Care: Progressive [102]  Covid Evaluation: Asymptomatic Screening Protocol (No Symptoms)  Diagnosis: Thrombocytopenia ([place]) [id number]  Admitting Physician: Elwyn Reach [[id number]]  Attending Physician: Elwyn Reach [[id number]]  Estimated length of stay: past midnight tomorrow  Certification:: I certify this patient will need inpatient services for at least 2 midnights  PT Class (Do Not Modify): Inpatient [101]  PT Acc Code (Do Not Modify): Private [1]       B Medical/Surgery History Past Medical History:  Diagnosis Date  . Anxiety   . Bipolar affective disorder ([place])    With anxiety features  . Cirrhosis of liver ([place])    Due to alcohol and hepatitis C  . Depression   . 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 ETOHism ([place])   . GERD (gastroesophageal reflux disease)   . Hematemesis 02/10/2018  . Hepatitis C 2018   hepatitis c and alcohol related hepatitis  . Heroin abuse ([place])   . History of blood transfusion    "blood doesn't clot; I fell down and had to have a transfusion"  . History of kidney stones   .  Menopause 2016  . Migraine    "when I get really stressed" (09/01/2017)  . Schizophrenia ([place])   . Seizures ([place])    "when I run out of my RX; lots recently" (09/01/2017)   Past Surgical History:  Procedure Laterality Date  . ESOPHAGOGASTRODUODENOSCOPY N/A 09/03/2017   Procedure: ESOPHAGOGASTRODUODENOSCOPY (EGD);  Surgeon: Doran Stabler, MD;  Location: [facility];  Service: Gastroenterology;  Laterality: N/A;  . FINGER FRACTURE SURGERY Left    "shattered my pinky"  . FRACTURE SURGERY    . I&D EXTREMITY Left 09/18/2018   Procedure: IRRIGATION AND DEBRIDEMENT EXTREMITY;  Surgeon: Leanora Cover, MD;  Location: [facility];  Service: Orthopedics;  Laterality: Left;  . IR PARACENTESIS  07/23/2017  . IR PARACENTESIS  07/2017   "did it twice in the same week" (09/01/2017)  . SHOULDER OPEN ROTATOR CUFF REPAIR Right   . TUBAL LIGATION    . VAGINAL HYSTERECTOMY       A IV Location/Drains/Wounds Patient Lines/Drains/Airways Status   Active Line/Drains/Airways    Name:   Placement date:   Placement time:   Site:   Days:   Peripheral IV 07/03/19 Left Hand   07/03/19    1851    Hand   1   Peripheral IV 07/04/19 Right;Anterior Forearm   07/04/19    1055    Forearm   less than 1  Intake/Output Last 24 hours  Intake/Output Summary (Last 24 hours) at 07/04/2019 1756 Last data filed at 07/04/2019 0915 Gross per 24 hour  Intake 882 ml  Output -  Net 882 ml    Labs/Imaging Results for orders placed or performed during the hospital encounter of 07/03/19 (from the past 48 hour(s))  Lipase, blood     Status: Abnormal   Collection Time: 07/03/19  6:51 PM  Result Value Ref Range   Lipase 68 (H) 11 - 51 U/L    Comment: Performed at [facility], [place] [address], [place], [place] [address]  Comprehensive metabolic panel     Status: Abnormal   Collection Time: 07/03/19  6:51 PM  Result Value Ref Range   Sodium 133 (L) 135 - 145 mmol/L   Potassium 3.9 3.5 - 5.1 mmol/L    Chloride 99 98 - 111 mmol/L   CO2 19 (L) 22 - 32 mmol/L   Glucose, Bld 97 70 - 99 mg/dL   BUN <5 (L) 6 - 20 mg/dL   Creatinine, Ser 0.52 0.44 - 1.00 mg/dL   Calcium 8.6 (L) 8.9 - 10.3 mg/dL   Total Protein 8.6 (H) 6.5 - 8.1 g/dL   Albumin 3.2 (L) 3.5 - 5.0 g/dL   AST 183 (H) 15 - 41 U/L   ALT 48 (H) 0 - 44 U/L   Alkaline Phosphatase 241 (H) 38 - 126 U/L   Total Bilirubin 2.2 (H) 0.3 - 1.2 mg/dL   GFR calc non Af Amer >60 >60 mL/min   GFR calc Af Amer >60 >60 mL/min   Anion gap 15 5 - 15    Comment: Performed at [facility], [place] [address], [place], [place] [address]  CBC     Status: Abnormal   Collection Time: 07/03/19  6:51 PM  Result Value Ref Range   WBC 2.8 (L) 4.0 - 10.5 K/uL   RBC 4.19 3.87 - 5.11 MIL/uL   Hemoglobin 13.0 12.0 - 15.0 g/dL   HCT 38.3 36.0 - 46.0 %   MCV 91.4 80.0 - 100.0 fL   MCH 31.0 26.0 - 34.0 pg   MCHC 33.9 30.0 - 36.0 g/dL   RDW 19.8 (H) 11.5 - 15.5 %   Platelets 12 (LL) 150 - 400 K/uL    Comment: REPEATED TO VERIFY PLATELET COUNT CONFIRMED BY SMEAR SPECIMEN CHECKED FOR CLOTS CRITICAL VALUE NOTED.  VALUE IS CONSISTENT WITH PREVIOUSLY REPORTED AND CALLED VALUE. CRITICAL RESULT CALLED TO, READ BACK BY AND VERIFIED WITH: KAREN NEWMAN RN AT 1951 07/03/2019 BY WOOLLENK    nRBC 0.0 0.0 - 0.2 %    Comment: Performed at [facility], [place] [address], [place], [place] [address]  Troponin I (High Sensitivity)     Status: None   Collection Time: 07/03/19  6:51 PM  Result Value Ref Range   Troponin I (High Sensitivity) 5 <18 ng/L    Comment: (NOTE) Elevated high sensitivity troponin I (hsTnI) values and significant  changes across serial measurements may suggest ACS but many other  chronic and acute conditions are known to elevate hsTnI results.  Refer to the Links section for chest pain algorithms and additional  guidance. Performed at [facility], [place] [address] [place], [place] [address]   I-Stat beta hCG blood, ED     Status:  None   Collection Time: 07/03/19  7:11 PM  Result Value Ref Range   I-stat hCG, quantitative <5.0 <5 mIU/mL  Comment 3            Comment:   GEST. AGE      CONC.  (mIU/mL)   <=1 WEEK        5 - 50     2 WEEKS       50 - 500     3 WEEKS       100 - 10,000     4 WEEKS     1,000 - 30,000        FEMALE AND NON-PREGNANT FEMALE:     LESS THAN 5 mIU/mL   Type and screen [place]     Status: None   Collection Time: 07/03/19  9:26 PM  Result Value Ref Range   ABO/RH(D) O POS    Antibody Screen NEG    Sample Expiration      07/06/2019,2359 Performed at [facility], [place] [address], [place], [place] [address]   Troponin I (High Sensitivity)     Status: None   Collection Time: 07/03/19  9:28 PM  Result Value Ref Range   Troponin I (High Sensitivity) 4 <18 ng/L    Comment: (NOTE) Elevated high sensitivity troponin I (hsTnI) values and significant  changes across serial measurements may suggest ACS but many other  chronic and acute conditions are known to elevate hsTnI results.  Refer to the "Links" section for chest pain algorithms and additional  guidance. Performed at [facility], [place] [address] [place], [place] [address]   Prepare Pheresed Platelets     Status: None   Collection Time: 07/03/19  9:28 PM  Result Value Ref Range   Unit Number [id number]    Blood Component Type PLTP LR1 PAS    Unit division 00    Status of Unit ISSUED,FINAL    Transfusion Status      OK TO TRANSFUSE Performed at [place] [address], [place], [place] [address]   SARS CORONAVIRUS 2 (TAT 6-24 HRS) Nasopharyngeal Nasopharyngeal Swab     Status: None   Collection Time: 07/03/19 10:01 PM   Specimen: Nasopharyngeal Swab  Result Value Ref Range   SARS Coronavirus 2 NEGATIVE NEGATIVE    Comment: (NOTE) SARS-CoV-2 target nucleic acids are NOT DETECTED. The SARS-CoV-2 RNA is generally detectable in upper and lower respiratory specimens during the acute  phase of infection. Negative results do not preclude SARS-CoV-2 infection, do not rule out co-infections with other pathogens, and should not be used as the sole basis for treatment or other patient management decisions. Negative results must be combined with clinical observations, patient history, and epidemiological information. The expected result is Negative. Fact Sheet for Patients: SugarRoll.be Fact Sheet for Healthcare Providers: https://www.woods-mathews.com/ This test is not yet approved or cleared by the [place] FDA and  has been authorized for detection and/or diagnosis of SARS-CoV-2 by FDA under an Emergency Use Authorization (EUA). This EUA will remain  in effect (meaning this test can be used) for the duration of the COVID-19 declaration under Section 56 4(b)(1) of the Act, 21 U.S.C. section 360bbb-3(b)(1), unless the authorization is terminated or revoked sooner. Performed at [facility], [place] [address] [place],  [address]   Urinalysis, Routine w reflex microscopic     Status: Abnormal   Collection Time: 07/03/19 10:45 PM  Result Value Ref Range   Color, Urine STRAW (A) YELLOW   APPearance CLEAR CLEAR   Specific Gravity, Urine 1.002 (L) 1.005 -  1.030   pH 5.0 5.0 - 8.0   Glucose, UA NEGATIVE NEGATIVE mg/dL   Hgb urine dipstick NEGATIVE NEGATIVE   Bilirubin Urine NEGATIVE NEGATIVE   Ketones, ur NEGATIVE NEGATIVE mg/dL   Protein, ur NEGATIVE NEGATIVE mg/dL   Nitrite NEGATIVE NEGATIVE   Leukocytes,Ua NEGATIVE NEGATIVE    Comment: Performed at [facility] [address] [place], [place] [address]  CBC with Differential/Platelet     Status: Abnormal   Collection Time: 07/04/19  2:12 AM  Result Value Ref Range   WBC 0.6 (LL) 4.0 - 10.5 K/uL    Comment: REPEATED TO VERIFY WHITE COUNT CONFIRMED ON SMEAR THIS CRITICAL RESULT HAS VERIFIED AND BEEN CALLED TO J.BARWICK,RN BY MELISSA BROGDON ON 10 25  2020 AT 0319, AND HAS BEEN READ BACK.     RBC 4.10 3.87 - 5.11 MIL/uL   Hemoglobin 12.9 12.0 - 15.0 g/dL   HCT 37.5 36.0 - 46.0 %   MCV 91.5 80.0 - 100.0 fL   MCH 31.5 26.0 - 34.0 pg   MCHC 34.4 30.0 - 36.0 g/dL   RDW 20.1 (H) 11.5 - 15.5 %   Platelets 5 (LL) 150 - 400 K/uL    Comment: REPEATED TO VERIFY PLATELET COUNT CONFIRMED BY SMEAR SPECIMEN CHECKED FOR CLOTS Immature Platelet Fraction may be clinically indicated, consider ordering this additional test [id number] CRITICAL RESULT CALLED TO, READ BACK BY AND VERIFIED WITH: M.BROOKS,RN 0310 07/04/2019 M.CAMPBELL    nRBC 0.0 0.0 - 0.2 %   Neutrophils Relative % 84 %   Neutro Abs 0.5 (L) 1.7 - 7.7 K/uL   Lymphocytes Relative 14 %   Lymphs Abs 0.1 (L) 0.7 - 4.0 K/uL   Monocytes Relative 2 %   Monocytes Absolute 0.0 (L) 0.1 - 1.0 K/uL   Eosinophils Relative 0 %   Eosinophils Absolute 0.0 0.0 - 0.5 K/uL   Basophils Relative 0 %   Basophils Absolute 0.0 0.0 - 0.1 K/uL   RBC Morphology MORPHOLOGY UNREMARKABLE    Immature Granulocytes 0 %   Abs Immature Granulocytes 0.00 0.00 - 0.07 K/uL    Comment: Performed at [facility] [address] [place], [place] [address]  Prepare Pheresed Platelets     Status: None (Preliminary result)   Collection Time: 07/04/19  3:33 AM  Result Value Ref Range   Unit Number [id number]    Blood Component Type PLTPHER LR1    Unit division 00    Status of Unit ISSUED    Transfusion Status OK TO TRANSFUSE    Unit Number [id number]    Blood Component Type PLTP LR2 PAS    Unit division 00    Status of Unit ISSUED    Transfusion Status      OK TO TRANSFUSE Performed at [facility] [name] [place], [place] [address]   Comprehensive metabolic panel     Status: Abnormal   Collection Time: 07/04/19  4:39 AM  Result Value Ref Range   Sodium 140 135 - 145 mmol/L   Potassium 3.4 (L) 3.5 - 5.1 mmol/L   Chloride 102 98 - 111 mmol/L   CO2 22 22 - 32 mmol/L   Glucose, Bld 97  70 - 99 mg/dL   BUN <5 (L) 6 - 20 mg/dL   Creatinine, Ser 0.72 0.44 - 1.00 mg/dL   Calcium 8.4 (L) 8.9 - 10.3 mg/dL   Total Protein 7.4 6.5 - 8.1 g/dL   Albumin 2.7 (L) 3.5 -  5.0 g/dL   AST 181 (H) 15 - 41 U/L   ALT 42 0 - 44 U/L   Alkaline Phosphatase 200 (H) 38 - 126 U/L   Total Bilirubin 2.8 (H) 0.3 - 1.2 mg/dL   GFR calc non Af Amer >60 >60 mL/min   GFR calc Af Amer >60 >60 mL/min   Anion gap 16 (H) 5 - 15    Comment: Performed at [facility] [address], [place], [place] [address]  CBC     Status: Abnormal   Collection Time: 07/04/19  4:39 AM  Result Value Ref Range   WBC 1.3 (LL) 4.0 - 10.5 K/uL    Comment: REPEATED TO VERIFY CRITICAL VALUE NOTED.  VALUE IS CONSISTENT WITH PREVIOUSLY REPORTED AND CALLED VALUE.    RBC 3.73 (L) 3.87 - 5.11 MIL/uL   Hemoglobin 11.6 (L) 12.0 - 15.0 g/dL   HCT 34.4 (L) 36.0 - 46.0 %   MCV 92.2 80.0 - 100.0 fL   MCH 31.1 26.0 - 34.0 pg   MCHC 33.7 30.0 - 36.0 g/dL   RDW 20.3 (H) 11.5 - 15.5 %   Platelets 5 (LL) 150 - 400 K/uL    Comment: REPEATED TO VERIFY Immature Platelet Fraction may be clinically indicated, consider ordering this additional test [id number] CRITICAL VALUE NOTED.  VALUE IS CONSISTENT WITH PREVIOUSLY REPORTED AND CALLED VALUE.    nRBC 0.0 0.0 - 0.2 %    Comment: Performed at [facility], [place] [address], [place], [place] [address]  CBG monitoring, ED     Status: Abnormal   Collection Time: 07/04/19 12:49 PM  Result Value Ref Range   Glucose-Capillary 154 (H) 70 - 99 mg/dL  CBC     Status: Abnormal   Collection Time: 07/04/19  2:37 PM  Result Value Ref Range   WBC 3.5 (L) 4.0 - 10.5 K/uL   RBC 3.56 (L) 3.87 - 5.11 MIL/uL   Hemoglobin 11.3 (L) 12.0 - 15.0 g/dL   HCT 33.4 (L) 36.0 - 46.0 %   MCV 93.8 80.0 - 100.0 fL   MCH 31.7 26.0 - 34.0 pg   MCHC 33.8 30.0 - 36.0 g/dL   RDW 19.9 (H) 11.5 - 15.5 %   Platelets 16 (LL) 150 - 400 K/uL    Comment: REPEATED TO VERIFY Immature Platelet Fraction may  be clinically indicated, consider ordering this additional test [id number] CRITICAL VALUE NOTED.  VALUE IS CONSISTENT WITH PREVIOUSLY REPORTED AND CALLED VALUE.    nRBC 0.0 0.0 - 0.2 %    Comment: Performed at [facility], [place] [address], [place], [place] [address]   Dg Chest 2 View  Result Date: 07/03/2019 CLINICAL DATA:  Chest pain EXAM: CHEST - 2 VIEW COMPARISON:  02/03/2019. FINDINGS: The heart size and mediastinal contours are within normal limits. Calcified left hilar lymph nodes. Both lungs are clear. The visualized skeletal structures are unremarkable. IMPRESSION: No acute abnormality of the lungs. Electronically Signed   By: Eddie Candle M.D.   On: 07/03/2019 20:13    Pending Labs Unresulted Labs (From admission, onward)    Start     Ordered   07/05/19 1300  Protime-INR  Tomorrow morning,   R     07/04/19 0849   07/05/19 0500  CBC with Differential  Daily,   R     07/04/19 0957          Vitals/Pain Today's Vitals   07/04/19 1603 07/04/19 1604 07/04/19 1649 07/04/19  1707  BP:   125/90 129/89  Pulse: 81 84  83  Resp:  17  18  Temp:      TempSrc:      SpO2: 92% 92%  95%  Weight:      Height:      PainSc:        Isolation Precautions Protective Precautions  Medications Medications  sodium chloride flush (NS) 0.9 % injection 3 mL (0 mLs Intravenous Hold 07/03/19 2028)  LORazepam (ATIVAN) tablet 1-4 mg (1 mg Oral Given 07/04/19 1027)    Or  LORazepam (ATIVAN) injection 1-4 mg ( Intravenous See Alternative 07/04/19 1027)  thiamine (VITAMIN B-1) tablet 100 mg (100 mg Oral Given 07/04/19 1028)    Or  thiamine (B-1) injection 100 mg ( Intravenous See Alternative [id number])  folic acid (FOLVITE) tablet 1 mg (1 mg Oral Given 07/04/19 1028)  multivitamin with minerals tablet 1 tablet (1 tablet Oral Given 07/04/19 1028)  acetaminophen (TYLENOL) tablet 650 mg ( Oral See Alternative 07/04/19 0852)    Or  acetaminophen (TYLENOL) suppository 650 mg (650 mg  Rectal Given 07/04/19 0852)  ondansetron (ZOFRAN) tablet 4 mg ( Oral See Alternative 07/04/19 0156)    Or  ondansetron (ZOFRAN) injection 4 mg (4 mg Intravenous Given 07/04/19 0156)  pantoprazole (PROTONIX) 80 mg in sodium chloride 0.9 % 250 mL (0.32 mg/mL) infusion (8 mg/hr Intravenous Restarted 07/04/19 1106)  predniSONE (DELTASONE) tablet 60 mg (60 mg Oral Given 07/04/19 1035)  Immune Globulin 10% (PRIVIGEN) IV infusion 55 g (0 g Intravenous Paused 07/04/19 1420)  diphenhydrAMINE (BENADRYL) capsule 25 mg (25 mg Oral Given 07/04/19 1046)  LORazepam (ATIVAN) injection 1 mg (1 mg Intravenous Given 07/03/19 2101)  0.9 %  sodium chloride infusion (Manually program via Guardrails IV Fluids) ( Intravenous Stopped 07/04/19 0955)  fentaNYL (SUBLIMAZE) injection 25 mcg (25 mcg Intravenous Given 07/03/19 2249)  methylPREDNISolone sodium succinate (SOLU-MEDROL) 125 mg/2 mL injection 60 mg (60 mg Intravenous Given 07/04/19 0432)  0.9 %  sodium chloride infusion (Manually program via Guardrails IV Fluids) (100 mL/hr Intravenous Restarted 07/04/19 1106)    Mobility walks Low fall risk   Focused Assessments    R Recommendations: See Admitting Provider Note  Report given to:   Additional Notes:

## 2019-07-04 NOTE — ED Notes (Signed)
Clear liquid breakfast tray ordered.

## 2019-07-04 NOTE — ED Notes (Signed)
Provided update to hospitalist rotating to ED. No new orders received. Patient eating lunch (clear diet) at this time.

## 2019-07-04 NOTE — ED Notes (Signed)
IV Nurse at bedside starting IVIG.

## 2019-07-04 NOTE — ED Notes (Signed)
Patient displaying withdrawal symptoms: tachycardic, vomited dark red blood >100cc, tremors, and hallucinating.

## 2019-07-04 NOTE — ED Notes (Signed)
Patient complain of headache. IVIG rate decreased at this time.

## 2019-07-04 NOTE — Progress Notes (Signed)
   07/04/19 1841  Vitals  Temp (!) 97.4 F (36.3 C)  Temp Source Oral  BP 130/87  MAP (mmHg) 102  BP Location Right Arm  BP Method Automatic  Patient Position (if appropriate) Lying  Pulse Rate 72  ECG Heart Rate 72  Resp 14  Oxygen Therapy  SpO2 91 %  O2 Device Room Air  Glasgow Coma Scale  Eye Opening 4  Best Verbal Response (NON-intubated) 5  Best Motor Response 6  Glasgow Coma Scale Score 15  Level of Consciousness  Level of Consciousness Alert  MEWS Score  MEWS RR 0  MEWS Pulse 0  MEWS Systolic 0  MEWS LOC 0  MEWS Temp 0  MEWS Score 0  MEWS Score Color Green  MEWS Assessment  Is this an acute change? No  Briana Sweeney is a 48 y.o. female patient admitted from ED awake, alert - oriented  X 4 - no acute distress noted. IV in place, occlusive dsg intact without redness.  Orientation to room, and floor completed with information packet given to patient/family.  Patient declined safety video at this time.  Admission INP armband ID verified with patient/family, and in place.   SR up x 2, fall assessment complete, with patient and family able to verbalize understanding of risk associated with falls, and verbalized understanding to call nsg before up out of bed.  Call light within reach, patient able to voice, and demonstrate understanding.  Skin, clean-dry- intact.  Pt has multiple bruises, she says she bruises easily due to thrombocytopenia.  Will pass on to oncoming night nurse.     Will cont to eval and treat per MD orders.  Damita Dunnings, RN 07/04/2019 7:48 PM

## 2019-07-05 LAB — BPAM PLATELET PHERESIS
Blood Product Expiration Date: 202010262359
Blood Product Expiration Date: 202010262359
ISSUE DATE / TIME: 202010250447
ISSUE DATE / TIME: 202010250655
Unit Type and Rh: 5100
Unit Type and Rh: 600

## 2019-07-05 LAB — PREPARE PLATELET PHERESIS
Unit division: 0
Unit division: 0

## 2019-07-05 LAB — CBC WITH DIFFERENTIAL/PLATELET
Abs Immature Granulocytes: 0.02 10*3/uL (ref 0.00–0.07)
Basophils Absolute: 0 10*3/uL (ref 0.0–0.1)
Basophils Relative: 0 %
Eosinophils Absolute: 0 10*3/uL (ref 0.0–0.5)
Eosinophils Relative: 0 %
HCT: 32.1 % — ABNORMAL LOW (ref 36.0–46.0)
Hemoglobin: 10.7 g/dL — ABNORMAL LOW (ref 12.0–15.0)
Immature Granulocytes: 0 %
Lymphocytes Relative: 7 %
Lymphs Abs: 0.3 10*3/uL — ABNORMAL LOW (ref 0.7–4.0)
MCH: 31.7 pg (ref 26.0–34.0)
MCHC: 33.3 g/dL (ref 30.0–36.0)
MCV: 95 fL (ref 80.0–100.0)
Monocytes Absolute: 0.7 10*3/uL (ref 0.1–1.0)
Monocytes Relative: 16 %
Neutro Abs: 3.4 10*3/uL (ref 1.7–7.7)
Neutrophils Relative %: 77 %
Platelets: 12 10*3/uL — CL (ref 150–400)
RBC: 3.38 MIL/uL — ABNORMAL LOW (ref 3.87–5.11)
RDW: 19.8 % — ABNORMAL HIGH (ref 11.5–15.5)
WBC Morphology: INCREASED
WBC: 4.5 10*3/uL (ref 4.0–10.5)
nRBC: 0 % (ref 0.0–0.2)

## 2019-07-05 LAB — PROTIME-INR
INR: 1.3 — ABNORMAL HIGH (ref 0.8–1.2)
Prothrombin Time: 16.3 seconds — ABNORMAL HIGH (ref 11.4–15.2)

## 2019-07-05 MED ORDER — ARIPIPRAZOLE 5 MG PO TABS
5.0000 mg | ORAL_TABLET | Freq: Every day | ORAL | Status: DC
  Administered 2019-07-05 – 2019-07-15 (×11): 5 mg via ORAL
  Filled 2019-07-05 (×11): qty 1

## 2019-07-05 MED ORDER — LACTULOSE 10 GM/15ML PO SOLN
20.0000 g | Freq: Three times a day (TID) | ORAL | Status: DC
  Administered 2019-07-05 – 2019-07-09 (×12): 20 g via ORAL
  Administered 2019-07-09 (×2): 10 g via ORAL
  Administered 2019-07-10 – 2019-07-11 (×4): 20 g via ORAL
  Filled 2019-07-05 (×19): qty 30

## 2019-07-05 MED ORDER — MIRTAZAPINE 15 MG PO TABS
15.0000 mg | ORAL_TABLET | Freq: Every day | ORAL | Status: DC
  Administered 2019-07-05 – 2019-07-14 (×10): 15 mg via ORAL
  Filled 2019-07-05 (×10): qty 1

## 2019-07-05 MED ORDER — LORAZEPAM 1 MG PO TABS: 1.0000 mg | ORAL_TABLET | ORAL | Status: DC | PRN

## 2019-07-05 MED ORDER — ADULT MULTIVITAMIN W/MINERALS CH
1.0000 | ORAL_TABLET | Freq: Every day | ORAL | Status: DC
  Administered 2019-07-06 – 2019-07-15 (×10): 1 via ORAL
  Filled 2019-07-05 (×10): qty 1

## 2019-07-05 MED ORDER — LOPERAMIDE HCL 2 MG PO CAPS
2.0000 mg | ORAL_CAPSULE | Freq: Three times a day (TID) | ORAL | Status: DC | PRN
  Administered 2019-07-05 – 2019-07-11 (×2): 2 mg via ORAL
  Filled 2019-07-05 (×2): qty 1

## 2019-07-05 MED ORDER — THIAMINE HCL 100 MG/ML IJ SOLN: 100.0000 mg | Freq: Every day | INTRAMUSCULAR | Status: DC

## 2019-07-05 MED ORDER — SODIUM CHLORIDE 0.9% FLUSH
10.0000 mL | INTRAVENOUS | Status: DC | PRN
  Administered 2019-07-07 – 2019-07-13 (×3): 10 mL
  Filled 2019-07-05 (×3): qty 40

## 2019-07-05 MED ORDER — VITAMIN B-1 100 MG PO TABS: 100.0000 mg | ORAL_TABLET | Freq: Every day | ORAL | Status: DC

## 2019-07-05 MED ORDER — FOLIC ACID 1 MG PO TABS: 1.0000 mg | ORAL_TABLET | Freq: Every day | ORAL | Status: DC

## 2019-07-05 MED ORDER — LEVETIRACETAM 500 MG PO TABS
500.0000 mg | ORAL_TABLET | Freq: Two times a day (BID) | ORAL | Status: DC
  Administered 2019-07-05 – 2019-07-15 (×21): 500 mg via ORAL
  Filled 2019-07-05 (×20): qty 1

## 2019-07-05 MED ORDER — LORAZEPAM 2 MG/ML IJ SOLN: 1.0000 mg | INTRAMUSCULAR | Status: DC | PRN

## 2019-07-05 MED ORDER — BUSPIRONE HCL 5 MG PO TABS
10.0000 mg | ORAL_TABLET | Freq: Two times a day (BID) | ORAL | Status: DC
  Administered 2019-07-05 – 2019-07-15 (×21): 10 mg via ORAL
  Filled 2019-07-05 (×21): qty 2

## 2019-07-05 MED ORDER — FLUOXETINE HCL 20 MG PO CAPS
40.0000 mg | ORAL_CAPSULE | Freq: Every day | ORAL | Status: DC
  Administered 2019-07-05 – 2019-07-15 (×11): 40 mg via ORAL
  Filled 2019-07-05 (×11): qty 2

## 2019-07-05 NOTE — Progress Notes (Signed)
Chaplain attempted to complete advanced directive with [place]. Chaplain was told it was not needed. Also told pt already has an AD and her sister is her 23. Chaplain remains available as needs arise.   Chaplain Resident, Evelene Croon, M Div Pager # [phone] personal Pager # [phone] on-call

## 2019-07-05 NOTE — Progress Notes (Addendum)
Briana Sweeney   DOB:11-18-1970   [id number]   [id number]  Subjective: The patient's platelet count today is 12,000.  She is currently receiving prednisone and IVIG. Has not seen any overt bleeding. Has multiple bruises. Has diarrhea related to Lactulose. Worried she is not getting Ativan, but just received prior to my visit. Currently receiving IVIG.   Objective: Middle-aged white woman examined in bed Vitals:   07/05/19 1204 07/05/19 1417  BP: (!) 143/87 (!) 158/98  Pulse: 64   Resp: 18   Temp: 97.9 F (36.6 C) 97.8 F (36.6 C)  SpO2: 97%     Body mass index is 26.36 kg/m.  Intake/Output Summary (Last 24 hours) at 07/05/2019 1453 Last data filed at 07/05/2019 1444 Gross per 24 hour  Intake 510 ml  Output 3 ml  Net 507 ml     Sclerae unicteric  Lungs no rales or wheezes--auscultated anterolaterally  Heart regular rate and rhythm  Abdomen soft, +BS  Neuro nonfocal  Skin - multiple bruises to arms and legs   CBG (last 3)  Recent Labs    07/04/19 1249  GLUCAP 154*     Labs:  Lab Results  Component Value Date   WBC 4.5 07/05/2019   HGB 10.7 (L) 07/05/2019   HCT 32.1 (L) 07/05/2019   MCV 95.0 07/05/2019   PLT 12 (LL) 07/05/2019   NEUTROABS 3.4 07/05/2019    @LASTCHEMISTRY @  Urine Studies No results for input(s): UHGB, CRYS in the last 72 hours.  Invalid input(s): UACOL, UAPR, USPG, UPH, UTP, UGL, UKET, UBIL, UNIT, UROB, ULEU, UEPI, UWBC, URBC, UBAC, CAST, UCOM, BILUA  Basic Metabolic Panel: Recent Labs  Lab 07/03/19 1851 07/04/19 0439  NA 133* 140  K 3.9 3.4*  CL 99 102  CO2 19* 22  GLUCOSE 97 97  BUN <5* <5*  CREATININE 0.52 0.72  CALCIUM 8.6* 8.4*   GFR Estimated Creatinine Clearance: 68.1 mL/min (by C-G formula based on SCr of 0.72 mg/dL). Liver Function Tests: Recent Labs  Lab 07/03/19 1851 07/04/19 0439  AST 183* 181*  ALT 48* 42  ALKPHOS 241* 200*  BILITOT 2.2* 2.8*  PROT 8.6* 7.4  ALBUMIN 3.2* 2.7*   Recent Labs  Lab  07/03/19 1851  LIPASE 68*   No results for input(s): AMMONIA in the last 168 hours. Coagulation profile No results for input(s): INR, PROTIME in the last 168 hours.  CBC: Recent Labs  Lab 07/03/19 1851 07/04/19 0212 07/04/19 0439 07/04/19 1437 07/05/19 0305  WBC 2.8* 0.6* 1.3* 3.5* 4.5  NEUTROABS  --  0.5*  --   --  3.4  HGB 13.0 12.9 11.6* 11.3* 10.7*  HCT 38.3 37.5 34.4* 33.4* 32.1*  MCV 91.4 91.5 92.2 93.8 95.0  PLT 12* 5* 5* 16* 12*   Cardiac Enzymes: No results for input(s): CKTOTAL, CKMB, CKMBINDEX, TROPONINI in the last 168 hours. BNP: Invalid input(s): POCBNP CBG: Recent Labs  Lab 07/04/19 1249  GLUCAP 154*   D-Dimer No results for input(s): DDIMER in the last 72 hours. Hgb A1c No results for input(s): HGBA1C in the last 72 hours. Lipid Profile No results for input(s): CHOL, HDL, LDLCALC, TRIG, CHOLHDL, LDLDIRECT in the last 72 hours. Thyroid function studies No results for input(s): TSH, T4TOTAL, T3FREE, THYROIDAB in the last 72 hours.  Invalid input(s): FREET3 Anemia work up No results for input(s): VITAMINB12, FOLATE, FERRITIN, TIBC, IRON, RETICCTPCT in the last 72 hours. Microbiology Recent Results (from the past 240 hour(s))  SARS CORONAVIRUS 2 (TAT 6-24 HRS)  Nasopharyngeal Nasopharyngeal Swab     Status: None   Collection Time: 07/03/19 10:01 PM   Specimen: Nasopharyngeal Swab  Result Value Ref Range Status   SARS Coronavirus 2 NEGATIVE NEGATIVE Final    Comment: (NOTE) SARS-CoV-2 target nucleic acids are NOT DETECTED. The SARS-CoV-2 RNA is generally detectable in upper and lower respiratory specimens during the acute phase of infection. Negative results do not preclude SARS-CoV-2 infection, do not rule out co-infections with other pathogens, and should not be used as the sole basis for treatment or other patient management decisions. Negative results must be combined with clinical observations, patient history, and epidemiological  information. The expected result is Negative. Fact Sheet for Patients: SugarRoll.be Fact Sheet for Healthcare Providers: https://www.woods-mathews.com/ This test is not yet approved or cleared by the [place] FDA and  has been authorized for detection and/or diagnosis of SARS-CoV-2 by FDA under an Emergency Use Authorization (EUA). This EUA will remain  in effect (meaning this test can be used) for the duration of the COVID-19 declaration under Section 56 4(b)(1) of the Act, 21 U.S.C. section 360bbb-3(b)(1), unless the authorization is terminated or revoked sooner. Performed at [facility], [place] [address] [place], [place] [address]       Studies:  Dg Chest 2 View  Result Date: 07/03/2019 CLINICAL DATA:  Chest pain EXAM: CHEST - 2 VIEW COMPARISON:  02/03/2019. FINDINGS: The heart size and mediastinal contours are within normal limits. Calcified left hilar lymph nodes. Both lungs are clear. The visualized skeletal structures are unremarkable. IMPRESSION: No acute abnormality of the lungs. Electronically Signed   By: Eddie Candle M.D.   On: 07/03/2019 20:13    Assessment: 48 y.o. Briana Sweeney woman with a history of immune thrombocytopenia, ongoing alcohol abuse.  Plan:  Briana Sweeney has had some response to standard treatment for immune thrombocytopenia in the past.  Peripheral blood smear reviewed yesterday with no evidence of DIC or TTP. She has been started on steroids IVIG. She has had a slight increase in her platelet counts. Monitor daily CBC. Transfuse for active bleeding.   Response to these agents may be compromised by her ongoing alcohol intake.  She most recently drank 07/04/2019.  I would anticipate between 5 and 10 days from now the patient's platelet count may have responded sufficiently for her to be safely discharged home.   Mikey Bussing, NP 07/05/2019  2:53 PM   ADDENDUM: Unfortunately, Briana Sweeney has really got  herself into trouble with her alcohol use.  She really has been drinking heavily.  She is starting to suffer from some encephalopathy because of this.  She just does not have the comprehension to understand the damage that alcohol is doing to her.  I am sure that her Hepatitis C has not been treated.  I am not sure this will ever be treated as I doubt she would be compliant with therapy.  Her platelet count this morning is 9000.  She responded well to Nplate and IVIG.  We will try this again.  Again, as long as she is drinking, as long as the Hepatitis C is active, her platelet count will never improve.  I guess our goal is to try to get her platelet count above 50,000.  How long it will stay above 50,000 is unclear.  I am unsure if we can even get the platelet count to 50,000.  Again, she really does not have much insight at all as to the damage that has been done  to her body by alcohol use.  I know that she can be quite challenging to care for.  I very much appreciate the great care and compassionate care that all the staff up on [address] is providing to her.  Briana Haw, MD  2 Timothy 1:7

## 2019-07-05 NOTE — Progress Notes (Signed)
Hospitalist progress note  If 7PM-7AM,  night-coverage-look on AMION -prefer pages-not epic chat,please  Briana Sweeney  TKP:[id number] DOB: [birth year] DOA: 07/03/2019 PCP: [name], FNP  Narrative:  33 white female chronic ethanolism 15 beers a day/vodka, untreated hep C, liver cirrhosis, bipolar, coffee-ground emesis 11/2018- oncology previously saw her 2019 03/2019 no response to platelet transfusion?  Immune-based thrombocytopenia Admitted c stabbing chest neck and back pain felt to be musculoskeletal in nature LFTs slightly elevated alk phos 200 AST/ALT 181/42 >usual 40/18 Bilirubin 2.8 up from 1.1 WBC 1.3 platelets 5 hemoglobin 11.6 Oncology consulted to assist with management Given 1 unit of platelets placed on CIWA protocol  Assessment & Plan: Severe thrombocytopenia Continue IVIG as well per Oncology-received 3 units of platelets Currently on oral prednisone 60 Mild improvement of PLT count to 12 Tachycardia 2/2 ETOH withdrawal + some blood loss anemia anemia-repeat labs again as below transfuse if below 7 saline 100 cc/h Probable upper GI bleed from probable alcoholic gastritis-Prior GI bleed 11/2018 Patient had episode of coffee-ground emesis- clear liquids increased to full and monitor if any futher bleed-continue PPI drip with no bolus  Cirrhosis hep C versus alcoholic with superimposed alcoholic hepatitis Cessation of alcohol is integral Hepatitis C-process of getting management-May need to have cessation prior to discussion about Alcoholism at risk for DTs CIWA elevated on admit--now consistet;y 12 range Schizophrenia Resumed home meds BuSpar 10 twice daily Abilify 5 daily Keppra 500 twice daily Topamax 50 daily Remeron 15 at bedtime Atarax 25 every 6 gabapentin 600 twice daily held for now resume if able to tol diet in am Pancytopenia Likely secondary to toxic effect of alcohol monitor trends  DVT prophylaxis: Needs SCDs  Code Status:   None   Family  Communication:   Presumed full Disposition Plan: Inpatient  Consultants:   Oncologist Procedures:   None Antimicrobials:   No Subjective: Multiple somatic c/o of HA, Leg pain, L breast pain No further coughing up blood no melena reported asking about grad diet  Objective: Vitals:   07/04/19 2244 07/05/19 0426 07/05/19 0735 07/05/19 0736  BP: 129/89   (!) 139/96  Pulse: 73   72  Resp: 16   (!) 23  Temp: 99.1 F (37.3 C) 98.2 F (36.8 C) 97.7 F (36.5 C)   TempSrc: Oral Oral Oral   SpO2: 95%   95%  Weight:      Height:        Intake/Output Summary (Last 24 hours) at 07/05/2019 0920 Last data filed at 07/05/2019 0429 Gross per 24 hour  Intake -  Output 3 ml  Net -3 ml   Filed Weights   07/03/19 1852 07/04/19 1851  Weight: 54.9 kg 59.2 kg    Examination:  Awake coherent much less tremulous  Ct ab no adde dsound abd soft nt dn no rebound no guarding Moves all 4 liombs equally without deficit No tremor  Data Reviewed: I have personally reviewed following labs and imaging studies CBC: Recent Labs  Lab 07/03/19 1851 07/04/19 0212 07/04/19 0439 07/04/19 1437 07/05/19 0305  WBC 2.8* 0.6* 1.3* 3.5* 4.5  NEUTROABS  --  0.5*  --   --  3.4  HGB 13.0 12.9 11.6* 11.3* 10.7*  HCT 38.3 37.5 34.4* 33.4* 32.1*  MCV 91.4 91.5 92.2 93.8 95.0  PLT 12* 5* 5* 16* 12*   Basic Metabolic Panel: Recent Labs  Lab 07/03/19 1851 07/04/19 0439  NA 133* 140  K 3.9 3.4*  CL 99 102  CO2  19* 22  GLUCOSE 97 97  BUN <5* <5*  CREATININE 0.52 0.72  CALCIUM 8.6* 8.4*   GFR: Estimated Creatinine Clearance: 68.1 mL/min (by C-G formula based on SCr of 0.72 mg/dL). Liver Function Tests: Recent Labs  Lab 07/03/19 1851 07/04/19 0439  AST 183* 181*  ALT 48* 42  ALKPHOS 241* 200*  BILITOT 2.2* 2.8*  PROT 8.6* 7.4  ALBUMIN 3.2* 2.7*   Recent Labs  Lab 07/03/19 1851  LIPASE 68*   No results for input(s): AMMONIA in the last 168 hours. Coagulation Profile: No results  for input(s): INR, PROTIME in the last 168 hours. Cardiac Enzymes: Radiology Studies: Reviewed images personally in health database  Scheduled Meds: . folic acid  1 mg Oral Daily  . multivitamin with minerals  1 tablet Oral Daily  . predniSONE  60 mg Oral Q breakfast  . sodium chloride flush  3 mL Intravenous Once  . thiamine  100 mg Oral Daily   Or  . thiamine  100 mg Intravenous Daily   Continuous Infusions: . IMMUNE GLOBULIN 10% (HUMAN) IV - For Fluid Restriction Only Stopped (07/04/19 1420)  . pantoprozole (PROTONIX) infusion 8 mg/hr (07/05/19 0833)    LOS: 2 days   Time spent: [place], MD [facility]  07/05/2019, 9:20 AM

## 2019-07-06 DIAGNOSIS — D696 Thrombocytopenia, unspecified: Secondary | ICD-10-CM

## 2019-07-06 LAB — CBC WITH DIFFERENTIAL/PLATELET
Abs Immature Granulocytes: 0.01 10*3/uL (ref 0.00–0.07)
Basophils Absolute: 0 10*3/uL (ref 0.0–0.1)
Basophils Relative: 0 %
Eosinophils Absolute: 0 10*3/uL (ref 0.0–0.5)
Eosinophils Relative: 0 %
HCT: 36.9 % (ref 36.0–46.0)
Hemoglobin: 12.3 g/dL (ref 12.0–15.0)
Immature Granulocytes: 0 %
Lymphocytes Relative: 18 %
Lymphs Abs: 0.6 10*3/uL — ABNORMAL LOW (ref 0.7–4.0)
MCH: 31.5 pg (ref 26.0–34.0)
MCHC: 33.3 g/dL (ref 30.0–36.0)
MCV: 94.6 fL (ref 80.0–100.0)
Monocytes Absolute: 0.4 10*3/uL (ref 0.1–1.0)
Monocytes Relative: 10 %
Neutro Abs: 2.6 10*3/uL (ref 1.7–7.7)
Neutrophils Relative %: 72 %
Platelets: 9 10*3/uL — CL (ref 150–400)
RBC: 3.9 MIL/uL (ref 3.87–5.11)
RDW: 19.7 % — ABNORMAL HIGH (ref 11.5–15.5)
WBC: 3.6 10*3/uL — ABNORMAL LOW (ref 4.0–10.5)
nRBC: 0 % (ref 0.0–0.2)

## 2019-07-06 LAB — COMPREHENSIVE METABOLIC PANEL
ALT: 35 U/L (ref 0–44)
ALT: 31 U/L (ref 0–44)
AST: 91 U/L — ABNORMAL HIGH (ref 15–41)
AST: 77 U/L — ABNORMAL HIGH (ref 15–41)
Albumin: 2.7 g/dL — ABNORMAL LOW (ref 3.5–5.0)
Albumin: 2.4 g/dL — ABNORMAL LOW (ref 3.5–5.0)
Alkaline Phosphatase: 141 U/L — ABNORMAL HIGH (ref 38–126)
Alkaline Phosphatase: 143 U/L — ABNORMAL HIGH (ref 38–126)
Anion gap: 8 (ref 5–15)
Anion gap: 9 (ref 5–15)
BUN: 7 mg/dL (ref 6–20)
BUN: 6 mg/dL (ref 6–20)
CO2: 22 mmol/L (ref 22–32)
CO2: 18 mmol/L — ABNORMAL LOW (ref 22–32)
Calcium: 8.9 mg/dL (ref 8.9–10.3)
Calcium: 8.5 mg/dL — ABNORMAL LOW (ref 8.9–10.3)
Chloride: 108 mmol/L (ref 98–111)
Chloride: 106 mmol/L (ref 98–111)
Creatinine, Ser: 0.64 mg/dL (ref 0.44–1.00)
Creatinine, Ser: 0.54 mg/dL (ref 0.44–1.00)
GFR calc Af Amer: >60 mL/min (ref >60)
GFR calc Af Amer: >60 mL/min (ref >60)
GFR calc non Af Amer: >60 mL/min (ref >60)
GFR calc non Af Amer: >60 mL/min (ref >60)
Glucose, Bld: 88 mg/dL (ref 70–99)
Glucose, Bld: 95 mg/dL (ref 70–99)
Potassium: 3.3 mmol/L — ABNORMAL LOW (ref 3.5–5.1)
Potassium: 3.4 mmol/L — ABNORMAL LOW (ref 3.5–5.1)
Sodium: 138 mmol/L (ref 135–145)
Sodium: 133 mmol/L — ABNORMAL LOW (ref 135–145)
Total Bilirubin: 2.7 mg/dL — ABNORMAL HIGH (ref 0.3–1.2)
Total Bilirubin: 2.9 mg/dL — ABNORMAL HIGH (ref 0.3–1.2)
Total Protein: 10.1 g/dL — ABNORMAL HIGH (ref 6.5–8.1)
Total Protein: 10.5 g/dL — ABNORMAL HIGH (ref 6.5–8.1)

## 2019-07-06 LAB — GLUCOSE, CAPILLARY: Glucose-Capillary: 81 mg/dL (ref 70–99)

## 2019-07-06 MED ORDER — IMMUNE GLOBULIN (HUMAN) 20 GM/200ML IV SOLN
1.0000 g/kg | INTRAVENOUS | Status: AC
  Administered 2019-07-06 – 2019-07-07 (×2): 60 g via INTRAVENOUS
  Filled 2019-07-06 (×2): qty 600

## 2019-07-06 MED ORDER — HYDROXYZINE HCL 25 MG PO TABS
25.0000 mg | ORAL_TABLET | Freq: Four times a day (QID) | ORAL | Status: DC | PRN
  Administered 2019-07-06 – 2019-07-14 (×14): 25 mg via ORAL
  Filled 2019-07-06 (×14): qty 1

## 2019-07-06 MED ORDER — CHLORDIAZEPOXIDE HCL 25 MG PO CAPS
50.0000 mg | ORAL_CAPSULE | Freq: Once | ORAL | Status: AC
  Administered 2019-07-06: 09:00:00 50 mg via ORAL
  Filled 2019-07-06: qty 2

## 2019-07-06 MED ORDER — GABAPENTIN 100 MG PO CAPS: 200.0000 mg | ORAL_CAPSULE | Freq: Two times a day (BID) | ORAL | Status: DC

## 2019-07-06 MED ORDER — GABAPENTIN 300 MG PO CAPS
300.0000 mg | ORAL_CAPSULE | Freq: Two times a day (BID) | ORAL | Status: DC
  Administered 2019-07-06 – 2019-07-15 (×20): 300 mg via ORAL
  Filled 2019-07-06 (×20): qty 1

## 2019-07-06 MED ORDER — LORAZEPAM 2 MG/ML IJ SOLN
4.0000 mg | Freq: Once | INTRAMUSCULAR | Status: AC
  Administered 2019-07-06: 4 mg via INTRAVENOUS

## 2019-07-06 MED ORDER — ALPRAZOLAM 0.5 MG PO TABS
0.5000 mg | ORAL_TABLET | Freq: Once | ORAL | Status: AC
  Administered 2019-07-06: 0.5 mg via ORAL
  Filled 2019-07-06: qty 1

## 2019-07-06 MED ORDER — ROMIPLOSTIM 250 MCG ~~LOC~~ SOLR
2.0000 ug/kg | SUBCUTANEOUS | Status: DC
  Administered 2019-07-06 – 2019-07-13 (×2): 120 ug via SUBCUTANEOUS
  Filled 2019-07-06 (×2): qty 0.24

## 2019-07-06 MED ORDER — PANTOPRAZOLE SODIUM 40 MG PO TBEC
40.0000 mg | DELAYED_RELEASE_TABLET | Freq: Two times a day (BID) | ORAL | Status: DC
  Administered 2019-07-06 – 2019-07-15 (×18): 40 mg via ORAL
  Filled 2019-07-06 (×18): qty 1

## 2019-07-06 NOTE — TOC Initial Note (Signed)
Transition of Care [facility]) - Initial/Assessment Note    Patient Details  Name: Briana Sweeney MRN: [id number] Date of Birth: Aug 04, 1971  [facility] of [facility]) CM/SW Contact:    Benard Halsted, LCSW Phone Number: 07/06/2019, 3:09 PM  Clinical Narrative:                 Patient is a 48 year old white female with chronic ethanolism (15 beers a day/vodka), untreated hep C, liver cirrhosis, and bipolar. TOC team following for high hospital readmission risk score. Patient last seen this past August and was set up with home health through [facility]. Continuing to follow for needs.         Patient Goals and CMS Choice        Expected Discharge Plan and Services                                                Prior Living Arrangements/Services                       Activities of Daily Living Home Assistive Devices/Equipment: None ADL Screening (condition at time of admission) Patient's cognitive ability adequate to safely complete daily activities?: Yes Is the patient deaf or have difficulty hearing?: No Does the patient have difficulty seeing, even when wearing glasses/contacts?: No Does the patient have difficulty concentrating, remembering, or making decisions?: Yes Patient able to express need for assistance with ADLs?: Yes Does the patient have difficulty dressing or bathing?: No Independently performs ADLs?: Yes (appropriate for developmental age) Does the patient have difficulty walking or climbing stairs?: No Weakness of Legs: None Weakness of Arms/Hands: None  Permission Sought/Granted                  Emotional Assessment              Admission diagnosis:  Thrombocytopenia ([place]) [D69.6] Atypical chest pain [R07.89] Patient Active Problem List   Diagnosis Date Noted  . Insomnia 05/05/2019  . Dizziness 04/02/2019  . Bruising 04/02/2019  . Hyponatremia 04/02/2019  . Head injury   . Scalp laceration, initial encounter   .  Fall 01/20/2019  . Epistaxis 01/20/2019  . Altered mental status   . 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 Anxiety 10/27/2018  . Neuropathy 10/27/2018  . Abscess of bursa of left elbow 09/23/2018  . Olecranon bursitis of left elbow   . Erysipelas 09/13/2018  . Acute metabolic encephalopathy 0000000  . Overdose 08/22/2018  . Hypotension 08/22/2018  . Seizure ([place]) 08/22/2018  . Substance induced mood disorder ([place]) 07/17/2018  . Cocaine dependence without complication ([place]) [id number]  . Polysubstance abuse ([place]) 04/08/2018  . 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 Hepatitis C 03/18/2018  . Portal hypertension ([place]) 03/18/2018  . Alcoholic intoxication without complication ([place]) [id number]  . Depression 03/18/2018  . Upper GI bleed 02/10/2018  . Alcohol use disorder, severe, in early remission ([place]) 02/10/2018  . Chronic anemia   . Thrombocytopenia due to drugs   . Suicidal ideation   . Thrombocytopenia ([place]) 01/02/2018  . GERD (gastroesophageal reflux disease) 11/01/2017  . Trichimoniasis 10/30/2017  . Alcohol dependence ([place]) 10/29/2017  . MDD (major depressive disorder), severe ([place]) 10/28/2017  . Acute hyperactive alcohol withdrawal delirium ([place]) 10/09/2017  . Schizophrenia ([place]) 10/09/2017  . Alcohol abuse with alcohol-induced mood  disorder ([place])   . Alcohol withdrawal syndrome with complication ([place]) [id number]  . Esophageal varices without bleeding ([place])   . Hematemesis 09/01/2017  . Ascites due to alcoholic cirrhosis ([place])   . Decompensated hepatic cirrhosis ([place]) 07/23/2017  . Alcohol abuse 07/23/2017  . Hepatitis C antibody positive in blood 07/23/2017  . Hypokalemia 07/23/2017  . Jaundice 07/23/2017  . Coagulopathy ([place]) 07/23/2017  . Hypomagnesemia 07/23/2017  . Pancytopenia ([place]) 07/23/2017  . Alcoholic cirrhosis of liver with ascites ([place]) 07/23/2017  . Bacterial vaginosis 06/03/2017  . UTI (urinary tract infection) 06/02/2017    PCP:  [name], FNP Pharmacy:   [facility], [address] [place] [address] Phone: [phone] Fax: [phone]     Social Determinants of Health (SDOH) Interventions    Readmission Risk Interventions Readmission Risk Prevention Plan 07/06/2019 04/12/2019  Transportation Screening Complete Complete  Medication Review Press photographer) Complete Complete  PCP or Specialist appointment within 3-5 days of discharge Complete Not Complete  PCP/Specialist Appt Not Complete comments - not ready for d/c  HRI or [place] Complete Complete  SW Recovery Care/Counseling Consult Complete Complete  Palliative Care Screening Not Applicable Not [place] Not Applicable Not Applicable

## 2019-07-06 NOTE — Progress Notes (Signed)
Pt has been sleeping in bed. Able to rouse and talk. Alert to self & knows she is in [place] however talks about a bar and a lady at the bar. Pt transferred to [facility] and voided. Helped back into bed and encouraged to rest. Will continue to monitor.     07/06/19 1200  CIWA-Ar  Pulse Rate (!) 113  Nausea and Vomiting 0  Tactile Disturbances 1  Tremor 1  Auditory Disturbances 1  Paroxysmal Sweats 0  Visual Disturbances 1  Anxiety 2  Headache, Fullness in Head 1  Agitation 1  Orientation and Clouding of Sensorium 1  CIWA-Ar Total 9

## 2019-07-06 NOTE — Progress Notes (Signed)
Hospitalist progress note  If 7PM-7AM,  night-coverage-look on AMION -prefer pages-not epic chat,please  Briana Sweeney  RWE:[id number] DOB: Jun 25, 1971 DOA: 07/03/2019 PCP: [name], FNP  Narrative:  32 white female chronic ethanolism 15 beers a day/vodka, untreated hep C, liver cirrhosis, bipolar, coffee-ground emesis 11/2018- oncology previously saw her 2019 03/2019 no response to platelet transfusion?  Immune-based thrombocytopenia Admitted c stabbing chest neck and back pain felt to be musculoskeletal in nature LFTs slightly elevated alk phos 200 AST/ALT 181/42 >usual 40/18 Bilirubin 2.8 up from 1.1 WBC 1.3 platelets 5 hemoglobin 11.6 Oncology consulted to assist with management Given 1 unit of platelets placed on CIWA protocol  Assessment & Plan: Severe thrombocytopenia received 3 units of platelets Currently on oral prednisone 60, is receiving IVIG in addition Platelet count now 9-defer to oncology further planning-May need platelets if goes below 5 again Withdrawal of alcohol, moderate to severe CIWA scores 20 last night mainly because of hallucinations and difficulty redirecting-, exam much given 4 mg of Ativan this morning in addition to 50 of Librium pulled down the shades and allow patient rest as she did not sleep overnight Low threshold to transfer to ICU for Precedex if worsens-I rechecked on her later on in the afternoon and she was a little more calm although still confused and having some mild hallucinations Tachycardia 2/2 ETOH withdrawal + some blood loss anemia anemia-repeat labs again as below transfuse if below 7--saline lock Probable upper GI bleed from probable alcoholic gastritis-Prior GI bleed 11/2018 Episodic coffee-ground emesis on admission-stabilized so starting soft diet 10/27 monitor trends and clinically vomiting and will need to change back to n.p.o. Cirrhosis hep C versus alcoholic with superimposed alcoholic hepatitis Cessation of alcohol is  integral Hepatitis C-not treated yet-monitor trends outpatient management Schizophrenia Resumed home meds BuSpar 10 twice daily Abilify 5 daily Keppra 500 twice daily Topamax 50 daily Remeron 15 at bedtime resuming Atarax 25 every 6  gabapentin 600 twice daily held for now resume if able to tol diet in am Pancytopenia Likely secondary to toxic effect of alcohol monitor trends  DVT prophylaxis: Needs SCDs  Code Status:   None   Family Communication:   Presumed full Disposition Plan: Inpatient  Consultants:   Oncologist Procedures:   None Antimicrobials:   No Subjective:  Called by RN staff this morning because CIWA score was 20 She was apparently hallucinating and has been up all night When I see her however she is redirectable can tell me where she is can tell me the date cannot tell me the year but can guess the season and can name her boyfriend who is at the bedside Later on nursing told me that she was hallucinating and thought that she was at a bar however she has calm since early this morning No chest pain No hematemesis No abdominal pain No fever  Objective: Vitals:   07/06/19 0815 07/06/19 1155 07/06/19 1200 07/06/19 1209  BP:  (!) 154/96    Pulse: 64 (!) 59 (!) 113 74  Resp: '19 20  19  '$ Temp:   (!) 97.5 F (36.4 C)   TempSrc:   Skin   SpO2: 100% 96%  94%  Weight:      Height:        Intake/Output Summary (Last 24 hours) at 07/06/2019 1403 Last data filed at 07/06/2019 1200 Gross per 24 hour  Intake 2159.39 ml  Output 1300 ml  Net 859.39 ml   Filed Weights   07/03/19 1852 07/04/19 1851  Weight: 54.9 kg 59.2 kg    Examination:  Slightly sleepy appearing sometimes incoherent EOMI NCAT mild icterus no pallor Chest clear no added sound Abdomen obese nontender S1-S2 no murmur rub or gallop Moving all 4 limbs equally  Data Reviewed: I have personally reviewed following labs and imaging studies CBC: Recent Labs  Lab 07/04/19 0212 07/04/19 0439  07/04/19 1437 07/05/19 0305 07/06/19 0317  WBC 0.6* 1.3* 3.5* 4.5 3.6*  NEUTROABS 0.5*  --   --  3.4 2.6  HGB 12.9 11.6* 11.3* 10.7* 12.3  HCT 37.5 34.4* 33.4* 32.1* 36.9  MCV 91.5 92.2 93.8 95.0 94.6  PLT 5* 5* 16* 12* 9*   Basic Metabolic Panel: Recent Labs  Lab 07/03/19 1851 07/04/19 0439 07/06/19 0317  NA 133* 140 138  K 3.9 3.4* 3.3*  CL 99 102 108  CO2 19* 22 22  GLUCOSE 97 97 88  BUN <5* <5* 7  CREATININE 0.52 0.72 0.64  CALCIUM 8.6* 8.4* 8.9   GFR: Estimated Creatinine Clearance: 68.1 mL/min (by C-G formula based on SCr of 0.64 mg/dL). Liver Function Tests: Recent Labs  Lab 07/03/19 1851 07/04/19 0439 07/06/19 0317  AST 183* 181* 91*  ALT 48* 42 35  ALKPHOS 241* 200* 141*  BILITOT 2.2* 2.8* 2.7*  PROT 8.6* 7.4 10.1*  ALBUMIN 3.2* 2.7* 2.7*   Recent Labs  Lab 07/03/19 1851  LIPASE 68*   No results for input(s): AMMONIA in the last 168 hours. Coagulation Profile: Recent Labs  Lab 07/05/19 1733  INR 1.3*   Cardiac Enzymes: Radiology Studies: Reviewed images personally in health database  Scheduled Meds: . ARIPiprazole  5 mg Oral Daily  . busPIRone  10 mg Oral BID  . FLUoxetine  40 mg Oral Daily  . folic acid  1 mg Oral Daily  . gabapentin  300 mg Oral BID  . lactulose  20 g Oral TID  . levETIRAcetam  500 mg Oral BID  . mirtazapine  15 mg Oral QHS  . multivitamin with minerals  1 tablet Oral Daily  . pantoprazole  40 mg Oral BID  . predniSONE  60 mg Oral Q breakfast  . romiPLOStim  2 mcg/kg Subcutaneous Weekly  . sodium chloride flush  3 mL Intravenous Once  . thiamine  100 mg Oral Daily   Or  . thiamine  100 mg Intravenous Daily   Continuous Infusions: . IMMUNE GLOBULIN 10% (HUMAN) IV - For Fluid Restriction Only      LOS: 3 days   Time spent: [place], MD [facility]  07/06/2019, 2:03 PM

## 2019-07-06 NOTE — Significant Event (Addendum)
Rapid Response Event Note  Overview:Called at [phone] with concern for increased CIWA despite 2mg  ativan x3. RN asked to page MD for assistance. MD ordered 0.5mg  xanax and 300mg  neurotin. Time Called: 0239 Arrival Time: 0350(RR RN with unstable pt) Event Type: Other (Comment)(CIWA)  Initial Focused Assessment: Pt sitting on side of bed, going thru her purse. Pt talking very fast and upset that she can't find what she is looking for. Pt has obvious tremors. Pt is alert and oriented but also confused and admits to having hallucinations. HR-95, BP-149/104, RR-24, SpO2-97% on RA, CIWA-25. In-depth assessment not done d/t pt agitation. Before RR RN arrived, pt had gotten out of bed and pulled off leads.  Interventions: 4mg  ativan IV given per CIWA protocol, will reassess in 1 hour. Plan of Care (if not transferred): Reassess CIWA in 1 hour(RR RN will come reassess if able). Give Ativan per CIWA protocol. Call RRT if further assistance needed.   0515-Update: CIWA-23, 4mg  ativan given IV, RN to recheck in 1 hour and treat per CIWA protocol.  0622-Update: RR RN unable to come assess pt, however pt is still agitated. Asked RN to recheck CIWA and treat with ativan. Ativan given at [phone]. Event Summary: Name of Physician Notified: Lennox Grumbles, NP at (PTA RRT)    at          [place], Carren Rang

## 2019-07-07 LAB — CBC WITH DIFFERENTIAL/PLATELET
Abs Immature Granulocytes: 0.02 10*3/uL (ref 0.00–0.07)
Basophils Absolute: 0 10*3/uL (ref 0.0–0.1)
Basophils Relative: 0 %
Eosinophils Absolute: 0 10*3/uL (ref 0.0–0.5)
Eosinophils Relative: 0 %
HCT: 35.8 % — ABNORMAL LOW (ref 36.0–46.0)
Hemoglobin: 11.8 g/dL — ABNORMAL LOW (ref 12.0–15.0)
Immature Granulocytes: 1 %
Lymphocytes Relative: 20 %
Lymphs Abs: 0.7 10*3/uL (ref 0.7–4.0)
MCH: 31.8 pg (ref 26.0–34.0)
MCHC: 33 g/dL (ref 30.0–36.0)
MCV: 96.5 fL (ref 80.0–100.0)
Monocytes Absolute: 0.4 10*3/uL (ref 0.1–1.0)
Monocytes Relative: 12 %
Neutro Abs: 2.3 10*3/uL (ref 1.7–7.7)
Neutrophils Relative %: 67 %
Platelets: 9 10*3/uL — CL (ref 150–400)
RBC: 3.71 MIL/uL — ABNORMAL LOW (ref 3.87–5.11)
RDW: 19.7 % — ABNORMAL HIGH (ref 11.5–15.5)
WBC: 3.4 10*3/uL — ABNORMAL LOW (ref 4.0–10.5)
nRBC: 0 % (ref 0.0–0.2)

## 2019-07-07 MED ORDER — CHLORDIAZEPOXIDE HCL 25 MG PO CAPS
25.0000 mg | ORAL_CAPSULE | Freq: Three times a day (TID) | ORAL | Status: DC
  Administered 2019-07-07: 25 mg via ORAL
  Filled 2019-07-07: qty 1

## 2019-07-07 MED ORDER — AMLODIPINE BESYLATE 5 MG PO TABS
5.0000 mg | ORAL_TABLET | Freq: Every day | ORAL | Status: DC
  Administered 2019-07-07 – 2019-07-15 (×9): 5 mg via ORAL
  Filled 2019-07-07 (×9): qty 1

## 2019-07-07 MED ORDER — POTASSIUM CHLORIDE CRYS ER 20 MEQ PO TBCR
40.0000 meq | EXTENDED_RELEASE_TABLET | Freq: Every day | ORAL | Status: DC
  Administered 2019-07-07: 40 meq via ORAL
  Filled 2019-07-07: qty 2

## 2019-07-07 MED ORDER — LORAZEPAM 2 MG/ML IJ SOLN
2.0000 mg | Freq: Once | INTRAMUSCULAR | Status: AC
  Administered 2019-07-07: 2 mg via INTRAVENOUS
  Filled 2019-07-07: qty 1

## 2019-07-07 MED ORDER — SODIUM CHLORIDE 0.9 % IV SOLN
Freq: Once | INTRAVENOUS | Status: AC
  Administered 2019-07-07: 18:00:00 via INTRAVENOUS

## 2019-07-07 MED ORDER — CHLORDIAZEPOXIDE HCL 25 MG PO CAPS
50.0000 mg | ORAL_CAPSULE | Freq: Three times a day (TID) | ORAL | Status: DC
  Administered 2019-07-07 – 2019-07-09 (×6): 50 mg via ORAL
  Filled 2019-07-07 (×7): qty 2
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
## 2019-07-07 NOTE — Progress Notes (Signed)
Pt continues to get out of bed stating she is leaving. Continues to say that her boyfriend is going to bring her some clothes. Pt was very disoriented and continues to pull at her lines. RN had to sit with pt in order for her to calm down some and stop the bed exit attempts. Tele sitter was order

## 2019-07-07 NOTE — Progress Notes (Signed)
Pt is very anxious and continues to ask for something for sleep. Pt continues to try and get out of bed. Pt stated if RN would give her a beer or shot of alcohol, she would go straight to sleep.

## 2019-07-08 DIAGNOSIS — D696 Thrombocytopenia, unspecified: Secondary | ICD-10-CM | POA: Diagnosis not present

## 2019-07-08 LAB — CBC WITH DIFFERENTIAL/PLATELET
Abs Immature Granulocytes: 0.01 10*3/uL (ref 0.00–0.07)
Basophils Absolute: 0 10*3/uL (ref 0.0–0.1)
Basophils Relative: 0 %
Eosinophils Absolute: 0 10*3/uL (ref 0.0–0.5)
Eosinophils Relative: 1 %
HCT: 33.5 % — ABNORMAL LOW (ref 36.0–46.0)
Hemoglobin: 11 g/dL — ABNORMAL LOW (ref 12.0–15.0)
Immature Granulocytes: 0 %
Lymphocytes Relative: 24 %
Lymphs Abs: 0.6 10*3/uL — ABNORMAL LOW (ref 0.7–4.0)
MCH: 31.4 pg (ref 26.0–34.0)
MCHC: 32.8 g/dL (ref 30.0–36.0)
MCV: 95.7 fL (ref 80.0–100.0)
Monocytes Absolute: 0.3 10*3/uL (ref 0.1–1.0)
Monocytes Relative: 13 %
Neutro Abs: 1.5 10*3/uL — ABNORMAL LOW (ref 1.7–7.7)
Neutrophils Relative %: 62 %
Platelets: 10 10*3/uL — CL (ref 150–400)
RBC: 3.5 MIL/uL — ABNORMAL LOW (ref 3.87–5.11)
RDW: 19.6 % — ABNORMAL HIGH (ref 11.5–15.5)
WBC: 2.4 10*3/uL — ABNORMAL LOW (ref 4.0–10.5)
nRBC: 0 % (ref 0.0–0.2)

## 2019-07-08 LAB — COMPREHENSIVE METABOLIC PANEL
ALT: 28 U/L (ref 0–44)
AST: 60 U/L — ABNORMAL HIGH (ref 15–41)
Albumin: 2 g/dL — ABNORMAL LOW (ref 3.5–5.0)
Alkaline Phosphatase: 134 U/L — ABNORMAL HIGH (ref 38–126)
Anion gap: 4 — ABNORMAL LOW (ref 5–15)
BUN: 6 mg/dL (ref 6–20)
CO2: 21 mmol/L — ABNORMAL LOW (ref 22–32)
Calcium: 7.9 mg/dL — ABNORMAL LOW (ref 8.9–10.3)
Chloride: 111 mmol/L (ref 98–111)
Creatinine, Ser: 0.51 mg/dL (ref 0.44–1.00)
GFR calc Af Amer: >60 mL/min (ref >60)
GFR calc non Af Amer: >60 mL/min (ref >60)
Glucose, Bld: 86 mg/dL (ref 70–99)
Potassium: 3.2 mmol/L — ABNORMAL LOW (ref 3.5–5.1)
Sodium: 136 mmol/L (ref 135–145)
Total Bilirubin: 2 mg/dL — ABNORMAL HIGH (ref 0.3–1.2)
Total Protein: 9.4 g/dL — ABNORMAL HIGH (ref 6.5–8.1)

## 2019-07-08 LAB — MAGNESIUM: Magnesium: 1.6 mg/dL — ABNORMAL LOW (ref 1.7–2.4)

## 2019-07-08 MED ORDER — MAGNESIUM SULFATE 2 GM/50ML IV SOLN
2.0000 g | Freq: Once | INTRAVENOUS | Status: AC
  Administered 2019-07-08: 2 g via INTRAVENOUS
  Filled 2019-07-08: qty 50

## 2019-07-08 MED ORDER — POTASSIUM CHLORIDE CRYS ER 20 MEQ PO TBCR
40.0000 meq | EXTENDED_RELEASE_TABLET | Freq: Two times a day (BID) | ORAL | Status: DC
  Administered 2019-07-08 – 2019-07-09 (×3): 40 meq via ORAL
  Filled 2019-07-08 (×3): qty 2

## 2019-07-08 NOTE — Progress Notes (Addendum)
HEMATOLOGY-ONCOLOGY PROGRESS NOTE  SUBJECTIVE: The patient is sleeping quietly when I arrived.  I did not wake her as she has been disoriented times trying to leave.  I spoke with her RN who reports no evidence of bleeding.  She has not voiced any other complaints today.  REVIEW OF SYSTEMS:   As noted in the HPI  I have reviewed the past medical history, past surgical history, social history and family history with the patient and they are unchanged from previous note.   PHYSICAL EXAMINATION:  Vitals:   07/08/19 0434 07/08/19 1507  BP: (!) 151/102 (!) 142/107  Pulse: 95 97  Resp: 16 18  Temp: 98 F (36.7 C) 98 F (36.7 C)  SpO2: 95% 98%   Filed Weights   07/03/19 1852 07/04/19 1851 07/08/19 0434  Weight: 121 lb (54.9 kg) 130 lb 8.2 oz (59.2 kg) 130 lb 8.2 oz (59.2 kg)    Intake/Output from previous day: 10/28 0701 - 10/29 0700 In: 3119.3 [P.O.:1662; I.V.:1397.3] Out: 850 [Urine:850]  GENERAL: Sleeping, looks comfortable  LABORATORY DATA:  I have reviewed the data as listed CMP Latest Ref Rng & Units 07/08/2019 07/06/2019 07/06/2019  Glucose 70 - 99 mg/dL 86 95 88  BUN 6 - 20 mg/dL 6 6 7   Creatinine 0.44 - 1.00 mg/dL 0.51 0.54 0.64  Sodium 135 - 145 mmol/L 136 133(L) 138  Potassium 3.5 - 5.1 mmol/L 3.2(L) 3.4(L) 3.3(L)  Chloride 98 - 111 mmol/L 111 106 108  CO2 22 - 32 mmol/L 21(L) 18(L) 22  Calcium 8.9 - 10.3 mg/dL 7.9(L) 8.5(L) 8.9  Total Protein 6.5 - 8.1 g/dL 9.4(H) 10.5(H) 10.1(H)  Total Bilirubin 0.3 - 1.2 mg/dL 2.0(H) 2.9(H) 2.7(H)  Alkaline Phos 38 - 126 U/L 134(H) 143(H) 141(H)  AST 15 - 41 U/L 60(H) 77(H) 91(H)  ALT 0 - 44 U/L 28 31 35    Lab Results  Component Value Date   WBC 2.4 (L) 07/08/2019   HGB 11.0 (L) 07/08/2019   HCT 33.5 (L) 07/08/2019   MCV 95.7 07/08/2019   PLT 10 (LL) 07/08/2019   NEUTROABS 1.5 (L) 07/08/2019    Dg Chest 2 View  Result Date: 07/03/2019 CLINICAL DATA:  Chest pain EXAM: CHEST - 2 VIEW COMPARISON:  02/03/2019.  FINDINGS: The heart size and mediastinal contours are within normal limits. Calcified left hilar lymph nodes. Both lungs are clear. The visualized skeletal structures are unremarkable. IMPRESSION: No acute abnormality of the lungs. Electronically Signed   By: Eddie Candle M.D.   On: 07/03/2019 20:13    ASSESSMENT AND PLAN: 1.  Thrombocytopenia -immune mediated versus secondary to hepatitis C going alcohol use. 2.  Alcohol withdrawal 3.  History of GI bleed secondary to alcohol gastritis 4.  Cirrhosis with hepatitis C with superimposed alcoholic hepatitis 5.  Schizophrenia  -Labs from today have been reviewed.  Platelet count remains low at 10,000.  She is not currently actively bleeding.  The patient is status post 2 doses of IVIG and remains on prednisone 60 mg daily.  She was also started on Nplate on 624THL.  She has responded well to this in the past.  It may take a few more days to see improvement.  Ideally, would like to get her platelet count above [id number], but I am not certain that we can get to that point given that her platelet count has rarely been 50,000 or higher over the past 2 years.  For now, we will continue on the prednisone and Nplate. -  Recommend daily CBC. -Recommend platelet transfusion only if she is actively bleeding.   LOS: 5 days   Mikey Bussing, DNP, AGPCNP-BC, AOCNP 07/08/19   ADDENDUM: I agree with the above note by Erasmo Downer.  Her platelet count is still quite low at 10,000.  I do not think she has had any bleeding.  I still think that her alcohol use is really toxic to her bone marrow.  I think the white cells and platelets are taking a "hit" with the alcohol use.  I suspect she also has a immune component to the thrombocytopenia.  This is why we are trying her on IVIG along with prednisone and Nplate.  It may take a couple days before we see her response.  I know that she is developing more encephalopathic issues from alcohol use.  I just feel bad that she  is destroying herself with alcohol.  It would be nice to try to get into a rehab program but this has really been incredibly difficult to accomplish.  I know that she is getting wonderful care from everybody up [address].  Lattie Haw, MD  1 Thessalonians 5:6
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
## 2019-07-09 ENCOUNTER — Telehealth: Payer: Self-pay | Admitting: Pharmacy Technician

## 2019-07-09 DIAGNOSIS — B1710 Acute hepatitis C without hepatic coma: Secondary | ICD-10-CM

## 2019-07-09 LAB — RENAL FUNCTION PANEL
Albumin: 2 g/dL — ABNORMAL LOW (ref 3.5–5.0)
Anion gap: 5 (ref 5–15)
BUN: 7 mg/dL (ref 6–20)
CO2: 22 mmol/L (ref 22–32)
Calcium: 8.3 mg/dL — ABNORMAL LOW (ref 8.9–10.3)
Chloride: 110 mmol/L (ref 98–111)
Creatinine, Ser: 0.53 mg/dL (ref 0.44–1.00)
GFR calc Af Amer: >60 mL/min (ref >60)
GFR calc non Af Amer: >60 mL/min (ref >60)
Glucose, Bld: 94 mg/dL (ref 70–99)
Phosphorus: 1.8 mg/dL — ABNORMAL LOW (ref 2.5–4.6)
Potassium: 4.2 mmol/L (ref 3.5–5.1)
Sodium: 137 mmol/L (ref 135–145)

## 2019-07-09 LAB — CBC WITH DIFFERENTIAL/PLATELET
Abs Immature Granulocytes: 0.01 10*3/uL (ref 0.00–0.07)
Basophils Absolute: 0 10*3/uL (ref 0.0–0.1)
Basophils Relative: 0 %
Eosinophils Absolute: 0 10*3/uL (ref 0.0–0.5)
Eosinophils Relative: 1 %
HCT: 35.4 % — ABNORMAL LOW (ref 36.0–46.0)
Hemoglobin: 11.4 g/dL — ABNORMAL LOW (ref 12.0–15.0)
Immature Granulocytes: 0 %
Lymphocytes Relative: 17 %
Lymphs Abs: 0.5 10*3/uL — ABNORMAL LOW (ref 0.7–4.0)
MCH: 31.1 pg (ref 26.0–34.0)
MCHC: 32.2 g/dL (ref 30.0–36.0)
MCV: 96.7 fL (ref 80.0–100.0)
Monocytes Absolute: 0.4 10*3/uL (ref 0.1–1.0)
Monocytes Relative: 14 %
Neutro Abs: 2 10*3/uL (ref 1.7–7.7)
Neutrophils Relative %: 68 %
Platelets: 11 10*3/uL — CL (ref 150–400)
RBC: 3.66 MIL/uL — ABNORMAL LOW (ref 3.87–5.11)
RDW: 20 % — ABNORMAL HIGH (ref 11.5–15.5)
WBC: 2.9 10*3/uL — ABNORMAL LOW (ref 4.0–10.5)
nRBC: 0 % (ref 0.0–0.2)

## 2019-07-09 LAB — MAGNESIUM: Magnesium: 1.9 mg/dL (ref 1.7–2.4)

## 2019-07-09 MED ORDER — POTASSIUM PHOSPHATE MONOBASIC 500 MG PO TABS
500.0000 mg | ORAL_TABLET | Freq: Three times a day (TID) | ORAL | Status: DC
  Filled 2019-07-09 (×2): qty 1

## 2019-07-09 MED ORDER — POTASSIUM CHLORIDE CRYS ER 20 MEQ PO TBCR
40.0000 meq | EXTENDED_RELEASE_TABLET | Freq: Every day | ORAL | Status: DC
  Administered 2019-07-10 – 2019-07-14 (×5): 40 meq via ORAL
  Filled 2019-07-09 (×5): qty 2

## 2019-07-09 MED ORDER — CHLORDIAZEPOXIDE HCL 25 MG PO CAPS
25.0000 mg | ORAL_CAPSULE | Freq: Three times a day (TID) | ORAL | Status: DC
  Administered 2019-07-09 – 2019-07-11 (×6): 25 mg via ORAL
  Filled 2019-07-09 (×6): qty 1

## 2019-07-09 MED ORDER — K PHOS MONO-SOD PHOS DI & MONO 155-852-130 MG PO TABS
500.0000 mg | ORAL_TABLET | Freq: Three times a day (TID) | ORAL | Status: DC
  Administered 2019-07-09 – 2019-07-12 (×11): 500 mg via ORAL
  Filled 2019-07-09 (×11): qty 2

## 2019-07-09 NOTE — Telephone Encounter (Signed)
[facility] Patient Advocate Encounter  Patient called in this morning to get an update on her course and timeline to get treatment. She is currently admitted to the hospital. She met with Kathe Becton, NP and had her Dilantin switched to [place]. She requested that Marya Amsler look at her labs and decide on a medication because of her platelets and liver condition. I told her we would follow-up with her on next steps.  Bartholomew Crews, CPhT Specialty Pharmacy Patient [facility] Phone: [phone] Fax: [phone] 07/09/2019 11:02 AM
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
## 2019-07-10 LAB — RENAL FUNCTION PANEL
Albumin: 2 g/dL — ABNORMAL LOW (ref 3.5–5.0)
Anion gap: 7 (ref 5–15)
BUN: 9 mg/dL (ref 6–20)
CO2: 22 mmol/L (ref 22–32)
Calcium: 8.1 mg/dL — ABNORMAL LOW (ref 8.9–10.3)
Chloride: 109 mmol/L (ref 98–111)
Creatinine, Ser: 0.56 mg/dL (ref 0.44–1.00)
GFR calc Af Amer: >60 mL/min (ref >60)
GFR calc non Af Amer: >60 mL/min (ref >60)
Glucose, Bld: 97 mg/dL (ref 70–99)
Phosphorus: 3.6 mg/dL (ref 2.5–4.6)
Potassium: 3.6 mmol/L (ref 3.5–5.1)
Sodium: 138 mmol/L (ref 135–145)

## 2019-07-10 LAB — CBC WITH DIFFERENTIAL/PLATELET
Abs Immature Granulocytes: 0 10*3/uL (ref 0.00–0.07)
Basophils Absolute: 0 10*3/uL (ref 0.0–0.1)
Basophils Relative: 0 %
Eosinophils Absolute: 0 10*3/uL (ref 0.0–0.5)
Eosinophils Relative: 1 %
HCT: 35.4 % — ABNORMAL LOW (ref 36.0–46.0)
Hemoglobin: 11.3 g/dL — ABNORMAL LOW (ref 12.0–15.0)
Immature Granulocytes: 0 %
Lymphocytes Relative: 19 %
Lymphs Abs: 0.5 10*3/uL — ABNORMAL LOW (ref 0.7–4.0)
MCH: 31.2 pg (ref 26.0–34.0)
MCHC: 31.9 g/dL (ref 30.0–36.0)
MCV: 97.8 fL (ref 80.0–100.0)
Monocytes Absolute: 0.5 10*3/uL (ref 0.1–1.0)
Monocytes Relative: 19 %
Neutro Abs: 1.5 10*3/uL — ABNORMAL LOW (ref 1.7–7.7)
Neutrophils Relative %: 61 %
Platelets: 14 10*3/uL — CL (ref 150–400)
RBC: 3.62 MIL/uL — ABNORMAL LOW (ref 3.87–5.11)
RDW: 20.4 % — ABNORMAL HIGH (ref 11.5–15.5)
WBC: 2.4 10*3/uL — ABNORMAL LOW (ref 4.0–10.5)
nRBC: 0 % (ref 0.0–0.2)

## 2019-07-10 LAB — MAGNESIUM: Magnesium: 1.8 mg/dL (ref 1.7–2.4)

## 2019-07-10 NOTE — Progress Notes (Signed)
Pt's CIWA was 100, Dr. Marylyn Ishihara is aware.  States he will not be giving pt's benzos.  Verbal order to stop CIWA.
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
## 2019-07-11 LAB — CBC WITH DIFFERENTIAL/PLATELET
Abs Immature Granulocytes: 0.01 10*3/uL (ref 0.00–0.07)
Basophils Absolute: 0 10*3/uL (ref 0.0–0.1)
Basophils Relative: 1 %
Eosinophils Absolute: 0 10*3/uL (ref 0.0–0.5)
Eosinophils Relative: 2 %
HCT: 33.5 % — ABNORMAL LOW (ref 36.0–46.0)
Hemoglobin: 11 g/dL — ABNORMAL LOW (ref 12.0–15.0)
Immature Granulocytes: 1 %
Lymphocytes Relative: 21 %
Lymphs Abs: 0.4 10*3/uL — ABNORMAL LOW (ref 0.7–4.0)
MCH: 31.5 pg (ref 26.0–34.0)
MCHC: 32.8 g/dL (ref 30.0–36.0)
MCV: 96 fL (ref 80.0–100.0)
Monocytes Absolute: 0.4 10*3/uL (ref 0.1–1.0)
Monocytes Relative: 21 %
Neutro Abs: 1.1 10*3/uL — ABNORMAL LOW (ref 1.7–7.7)
Neutrophils Relative %: 54 %
Platelets: 20 10*3/uL — CL (ref 150–400)
RBC: 3.49 MIL/uL — ABNORMAL LOW (ref 3.87–5.11)
RDW: 20.7 % — ABNORMAL HIGH (ref 11.5–15.5)
WBC: 2.1 10*3/uL — ABNORMAL LOW (ref 4.0–10.5)
nRBC: 0 % (ref 0.0–0.2)

## 2019-07-11 LAB — LIPASE, BLOOD: Lipase: 57 U/L — ABNORMAL HIGH (ref 11–51)

## 2019-07-11 MED ORDER — CHLORDIAZEPOXIDE HCL 5 MG PO CAPS
10.0000 mg | ORAL_CAPSULE | Freq: Three times a day (TID) | ORAL | Status: DC
  Administered 2019-07-11 – 2019-07-12 (×5): 10 mg via ORAL
  Filled 2019-07-11 (×5): qty 2

## 2019-07-11 MED ORDER — LACTULOSE 10 GM/15ML PO SOLN
20.0000 g | Freq: Every day | ORAL | Status: DC
  Administered 2019-07-12 – 2019-07-14 (×2): 20 g via ORAL
  Filled 2019-07-11 (×4): qty 30
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
## 2019-07-12 LAB — CBC WITH DIFFERENTIAL/PLATELET
Abs Immature Granulocytes: 0 10*3/uL (ref 0.00–0.07)
Basophils Absolute: 0 10*3/uL (ref 0.0–0.1)
Basophils Relative: 0 %
Eosinophils Absolute: 0 10*3/uL (ref 0.0–0.5)
Eosinophils Relative: 0 %
HCT: 34 % — ABNORMAL LOW (ref 36.0–46.0)
Hemoglobin: 11.1 g/dL — ABNORMAL LOW (ref 12.0–15.0)
Immature Granulocytes: 0 %
Lymphocytes Relative: 18 %
Lymphs Abs: 0.4 10*3/uL — ABNORMAL LOW (ref 0.7–4.0)
MCH: 31.4 pg (ref 26.0–34.0)
MCHC: 32.6 g/dL (ref 30.0–36.0)
MCV: 96 fL (ref 80.0–100.0)
Monocytes Absolute: 0.4 10*3/uL (ref 0.1–1.0)
Monocytes Relative: 19 %
Neutro Abs: 1.4 10*3/uL — ABNORMAL LOW (ref 1.7–7.7)
Neutrophils Relative %: 63 %
Platelets: 27 10*3/uL — CL (ref 150–400)
RBC: 3.54 MIL/uL — ABNORMAL LOW (ref 3.87–5.11)
RDW: 20.5 % — ABNORMAL HIGH (ref 11.5–15.5)
WBC: 2.3 10*3/uL — ABNORMAL LOW (ref 4.0–10.5)
nRBC: 0 % (ref 0.0–0.2)

## 2019-07-12 LAB — RENAL FUNCTION PANEL
Albumin: 2 g/dL — ABNORMAL LOW (ref 3.5–5.0)
Anion gap: 6 (ref 5–15)
BUN: 11 mg/dL (ref 6–20)
CO2: 24 mmol/L (ref 22–32)
Calcium: 8.3 mg/dL — ABNORMAL LOW (ref 8.9–10.3)
Chloride: 109 mmol/L (ref 98–111)
Creatinine, Ser: 0.62 mg/dL (ref 0.44–1.00)
GFR calc Af Amer: >60 mL/min (ref >60)
GFR calc non Af Amer: >60 mL/min (ref >60)
Glucose, Bld: 112 mg/dL — ABNORMAL HIGH (ref 70–99)
Phosphorus: 4.7 mg/dL — ABNORMAL HIGH (ref 2.5–4.6)
Potassium: 3.9 mmol/L (ref 3.5–5.1)
Sodium: 139 mmol/L (ref 135–145)

## 2019-07-12 LAB — MAGNESIUM: Magnesium: 2 mg/dL (ref 1.7–2.4)

## 2019-07-12 LAB — MRSA PCR SCREENING: MRSA by PCR: NEGATIVE

## 2019-07-12 MED ORDER — SOFOSBUVIR-VELPATASVIR 400-100 MG PO TABS: 1.0000 | ORAL_TABLET | Freq: Every day | ORAL | 5 refills | Status: DC

## 2019-07-12 NOTE — Telephone Encounter (Signed)
She would also need at least 3-4 weeks off of Dilantin before starting treatment.  Looks like she had Keppra sent in on 10/6 so should not be an issue.

## 2019-07-12 NOTE — TOC Progression Note (Signed)
Transition of Care [facility]) - Progression Note    Patient Details  Name: Briana Sweeney MRN: [id number] Date of Birth: 1970/12/26  Transition of Care [facility]) CM/SW Contact  Sharin Mons, RN Phone Number: 07/12/2019, 10:36 AM  Clinical Narrative:    Pt with chronic ethanolism, untreated hep C, liver cirrhosis, and bipolar. High readmission score.  TOC continuing to monitor for disposition needs. Pt not medically ready ... low Platelets low 27, goal 50.   Expected Discharge Plan: Home/Self Care Barriers to Discharge: Continued Medical Work up(not medically ready, Platelets too low)  Expected Discharge Plan and Services Expected Discharge Plan: Home/Self Care                                               Social Determinants of Health (SDOH) Interventions    Readmission Risk Interventions Readmission Risk Prevention Plan 07/06/2019 04/12/2019  Transportation Screening Complete Complete  Medication Review Press photographer) Complete Complete  PCP or Specialist appointment within 3-5 days of discharge Complete Not Complete  PCP/Specialist Appt Not Complete comments - not ready for d/c  HRI or [place] Complete Complete  SW Recovery Care/Counseling Consult Complete Complete  Palliative Care Screening Not Applicable Not [place] Not Applicable Not Applicable
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
## 2019-07-12 NOTE — Addendum Note (Signed)
Addended by: Mauricio Po D on: 07/12/2019 12:27 PM   Modules accepted: Orders

## 2019-07-12 NOTE — Telephone Encounter (Signed)
Briana Sweeney has Genotype 1a chronic Hepatitis C complicated with apparent decompensated liver cirrhosis and thrombocytopenia. Her anti-seizure medications have been changed and now is on Keppra. Because of these factors will need longer duration of treatment and will prescribe 24 weeks of Epclusa given her decompensated liver status. She will need to stop her pantoprazole as there are drug interactions during this time if we are to proceed with treatment.

## 2019-07-13 ENCOUNTER — Telehealth: Payer: Self-pay

## 2019-07-13 LAB — RENAL FUNCTION PANEL
Albumin: 2.2 g/dL — ABNORMAL LOW (ref 3.5–5.0)
Anion gap: 9 (ref 5–15)
BUN: 9 mg/dL (ref 6–20)
CO2: 23 mmol/L (ref 22–32)
Calcium: 9 mg/dL (ref 8.9–10.3)
Chloride: 105 mmol/L (ref 98–111)
Creatinine, Ser: 0.67 mg/dL (ref 0.44–1.00)
GFR calc Af Amer: >60 mL/min (ref >60)
GFR calc non Af Amer: >60 mL/min (ref >60)
Glucose, Bld: 91 mg/dL (ref 70–99)
Phosphorus: 4 mg/dL (ref 2.5–4.6)
Potassium: 3.8 mmol/L (ref 3.5–5.1)
Sodium: 137 mmol/L (ref 135–145)

## 2019-07-13 LAB — CBC WITH DIFFERENTIAL/PLATELET
Abs Immature Granulocytes: 0.01 10*3/uL (ref 0.00–0.07)
Basophils Absolute: 0 10*3/uL (ref 0.0–0.1)
Basophils Relative: 0 %
Eosinophils Absolute: 0 10*3/uL (ref 0.0–0.5)
Eosinophils Relative: 2 %
HCT: 35.4 % — ABNORMAL LOW (ref 36.0–46.0)
Hemoglobin: 11.3 g/dL — ABNORMAL LOW (ref 12.0–15.0)
Immature Granulocytes: 0 %
Lymphocytes Relative: 20 %
Lymphs Abs: 0.5 10*3/uL — ABNORMAL LOW (ref 0.7–4.0)
MCH: 31 pg (ref 26.0–34.0)
MCHC: 31.9 g/dL (ref 30.0–36.0)
MCV: 97.3 fL (ref 80.0–100.0)
Monocytes Absolute: 0.4 10*3/uL (ref 0.1–1.0)
Monocytes Relative: 15 %
Neutro Abs: 1.7 10*3/uL (ref 1.7–7.7)
Neutrophils Relative %: 63 %
Platelets: 18 10*3/uL — CL (ref 150–400)
RBC: 3.64 MIL/uL — ABNORMAL LOW (ref 3.87–5.11)
RDW: 20.5 % — ABNORMAL HIGH (ref 11.5–15.5)
WBC: 2.7 10*3/uL — ABNORMAL LOW (ref 4.0–10.5)
nRBC: 0 % (ref 0.0–0.2)

## 2019-07-13 MED ORDER — CHLORDIAZEPOXIDE HCL 5 MG PO CAPS
10.0000 mg | ORAL_CAPSULE | Freq: Two times a day (BID) | ORAL | Status: DC
  Administered 2019-07-13 – 2019-07-15 (×5): 10 mg via ORAL
  Filled 2019-07-13 (×5): qty 2

## 2019-07-13 NOTE — Telephone Encounter (Signed)
Briana Sweeney is an in-patient at [facility] (Atypical chest pain & Thrombocytopenia). Per patient her platelets  have been unsteady. And she not receiving much information about her condition. Which is increasing her anxiety.   Patient has been advised to talk with her nurse or nurse care manager. We can not interfere with her in-patient care.

## 2019-07-13 NOTE — Progress Notes (Signed)
[name] Kitchen  PROGRESS NOTE    Briana Sweeney  [id number] DOB: 02-22-1971 DOA: 07/03/2019 PCP: [name], FNP   Brief Narrative:   33 white female chronicalcoholism (15 beers a day/vodka), untreated hep C, liver cirrhosis, bipolar, coffee-ground emesis 11/2018. Admittedw/stabbing chest neck and back pain felt to be musculoskeletal in nature  11/3: Plts down to 18k. Rpt lab as it has been on a steady rising trend the last few days.   Assessment & Plan:   Principal Problem:   Thrombocytopenia ([place]) Active Problems:   Alcohol abuse   Alcoholic cirrhosis of liver with ascites (HCC)   Schizophrenia (HCC)   GERD (gastroesophageal reflux disease)   Polysubstance abuse (HCC)  Severe thrombocytopenia (immune thrombocytopenia?) Pancytopenia - received 3 units of platelets - PO prednisone 60mg  qday, IVIG in addition - Onco onboard, appreciate assistance; defer further plt needs to them; goal is plt count to 50k - 11/1: Plt up to 20k this AM;continue current Tx     - 11/2: Plt up to 27k this AM; no changes to current Tx     - 11/3: Plt down to 18k; ?labs error; continue as per oncology  EtOHdependence -d/cCIWAA - librium - seeking behavior noted - thiamine, MVI     - wean librium     - Patient is not withdrawal. Patient daily tries to negotiate xanax, ativan, and valium by name and is quite calm while she is doing it; continue to wean librium  Tachycardia - 2/2 ETOH withdrawal + some blood loss anemia anemia - fluids, CIWAA - improved/resolved     - CIWA d/c'd  Probable upper GI bleed from probable alcoholic gastritis Normocytic anemia, multifactoria - Prior GI bleed 11/2018 - Episodic coffee-ground emesis on admission - transfuse for Hgb less than 7 - Hgb is stable, monitor  Cirrhosis hep C with superimposed alcoholic hepatitis - Cessation of alcohol is integral - Hepatitis C-not treated yet;  monitor trends; outpatient management  Schizophrenia - Resumed home BuSpar 10mg  BID, Abilify 5mg  qday, Keppra 500mg  BID, Topamax 50mg  qday, Remeron 15mg  qHS, Atarax 25mg  q6h - prozac 40mg  qday  HTN - amlodipine 5mg ; BP ok.  Hypokalemia Hyponatremia Hypomagnesemia Hypophosphatemia -resolved, monitor     - 11/2: d/c phos     - 11/3: labs pending, follow  DVT prophylaxis: SCDs Code Status: FULL   Disposition Plan: TBD  Consultants:   Oncology  ROS:  Denies CP, dyspnea, ab pain, N, V . Remainder 10-pt ROS is negative for all not previously mentioned.  Subjective: "Do you think there are better bipolar meds out there?"  Objective: Vitals:   07/12/19 1648 07/12/19 2136 07/13/19 0020 07/13/19 0430  BP: 109/77 128/83 112/88 129/77  Pulse: 89 81 71 (!) 59  Resp: 16     Temp: 98.4 F (36.9 C) 97.6 F (36.4 C) 98.3 F (36.8 C) 98.2 F (36.8 C)  TempSrc: Oral Oral Oral Oral  SpO2: 100% 99% 92% 97%  Weight:      Height:        Intake/Output Summary (Last 24 hours) at 07/13/2019 0755 Last data filed at 07/12/2019 1300 Gross per 24 hour  Intake 960 ml  Output -  Net 960 ml   Filed Weights   07/03/19 1852 07/04/19 1851 07/08/19 0434  Weight: 54.9 kg 59.2 kg 59.2 kg    Examination:  General: 48 y.o. female resting in bed in NAD Cardiovascular: RRR, +S1, S2, no m/g/r, equal pulses throughout Respiratory: CTABL, no w/r/r, normal WOB GI: BS+, NDNT,  no masses noted, no organomegaly noted MSK: No e/c/c Skin: multiple tattoos; BUE bruising noted Neuro: A&O x 3, no focal deficits Psyc: Appropriate interaction and affect, calm/cooperative   Data Reviewed: I have personally reviewed following labs and imaging studies.  CBC: Recent Labs  Lab 07/09/19 0421 07/10/19 0309 07/11/19 0309 07/12/19 0326 07/13/19 0510  WBC 2.9* 2.4* 2.1* 2.3* 2.7*  NEUTROABS 2.0 1.5* 1.1* 1.4* PENDING  HGB 11.4* 11.3* 11.0* 11.1* 11.3*  HCT 35.4* 35.4* 33.5* 34.0*  35.4*  MCV 96.7 97.8 96.0 96.0 97.3  PLT 11* 14* 20* 27* 18*   Basic Metabolic Panel: Recent Labs  Lab 07/06/19 2202 07/08/19 0409 07/09/19 0421 07/10/19 0309 07/12/19 0326  NA 133* 136 137 138 139  K 3.4* 3.2* 4.2 3.6 3.9  CL 106 111 110 109 109  CO2 18* 21* 22 22 24   GLUCOSE 95 86 94 97 112*  BUN 6 6 7 9 11   CREATININE 0.54 0.51 0.53 0.56 0.62  CALCIUM 8.5* 7.9* 8.3* 8.1* 8.3*  MG  --  1.6* 1.9 1.8 2.0  PHOS  --   --  1.8* 3.6 4.7*   GFR: Estimated Creatinine Clearance: 68.1 mL/min (by C-G formula based on SCr of 0.62 mg/dL). Liver Function Tests: Recent Labs  Lab 07/06/19 2202 07/08/19 0409 07/09/19 0421 07/10/19 0309 07/12/19 0326  AST 77* 60*  --   --   --   ALT 31 28  --   --   --   ALKPHOS 143* 134*  --   --   --   BILITOT 2.9* 2.0*  --   --   --   PROT 10.5* 9.4*  --   --   --   ALBUMIN 2.4* 2.0* 2.0* 2.0* 2.0*   Recent Labs  Lab 07/11/19 1035  LIPASE 57*   No results for input(s): AMMONIA in the last 168 hours. Coagulation Profile: No results for input(s): INR, PROTIME in the last 168 hours. Cardiac Enzymes: No results for input(s): CKTOTAL, CKMB, CKMBINDEX, TROPONINI in the last 168 hours. BNP (last 3 results) No results for input(s): PROBNP in the last 8760 hours. HbA1C: No results for input(s): HGBA1C in the last 72 hours. CBG: No results for input(s): GLUCAP in the last 168 hours. Lipid Profile: No results for input(s): CHOL, HDL, LDLCALC, TRIG, CHOLHDL, LDLDIRECT in the last 72 hours. Thyroid Function Tests: No results for input(s): TSH, T4TOTAL, FREET4, T3FREE, THYROIDAB in the last 72 hours. Anemia Panel: No results for input(s): VITAMINB12, FOLATE, FERRITIN, TIBC, IRON, RETICCTPCT in the last 72 hours. Sepsis Labs: No results for input(s): PROCALCITON, LATICACIDVEN in the last 168 hours.  Recent Results (from the past 240 hour(s))  SARS CORONAVIRUS 2 (TAT 6-24 HRS) Nasopharyngeal Nasopharyngeal Swab     Status: None   Collection  Time: 07/03/19 10:01 PM   Specimen: Nasopharyngeal Swab  Result Value Ref Range Status   SARS Coronavirus 2 NEGATIVE NEGATIVE Final    Comment: (NOTE) SARS-CoV-2 target nucleic acids are NOT DETECTED. The SARS-CoV-2 RNA is generally detectable in upper and lower respiratory specimens during the acute phase of infection. Negative results do not preclude SARS-CoV-2 infection, do not rule out co-infections with other pathogens, and should not be used as the sole basis for treatment or other patient management decisions. Negative results must be combined with clinical observations, patient history, and epidemiological information. The expected result is Negative. Fact Sheet for Patients: SugarRoll.be Fact Sheet for Healthcare Providers: https://www.woods-mathews.com/ This test is not yet approved or  cleared by the [place] and  has been authorized for detection and/or diagnosis of SARS-CoV-2 by FDA under an Emergency Use Authorization (EUA). This EUA will remain  in effect (meaning this test can be used) for the duration of the COVID-19 declaration under Section 56 4(b)(1) of the Act, 21 U.S.C. section 360bbb-3(b)(1), unless the authorization is terminated or revoked sooner. Performed at [facility], [place] [address] [place], [place] [address]   MRSA PCR Screening     Status: None   Collection Time: 07/12/19 11:35 AM   Specimen: Nasal Mucosa; Nasopharyngeal  Result Value Ref Range Status   MRSA by PCR NEGATIVE NEGATIVE Final    Comment:        The GeneXpert MRSA Assay (FDA approved for NASAL specimens only), is one component of a comprehensive MRSA colonization surveillance program. It is not intended to diagnose MRSA infection nor to guide or monitor treatment for MRSA infections. Performed at [facility], [place] [address], [place],  [address]       Radiology Studies: No results found.   Scheduled  Meds: . amLODipine  5 mg Oral Daily  . ARIPiprazole  5 mg Oral Daily  . busPIRone  10 mg Oral BID  . chlordiazePOXIDE  10 mg Oral TID  . FLUoxetine  40 mg Oral Daily  . folic acid  1 mg Oral Daily  . gabapentin  300 mg Oral BID  . lactulose  20 g Oral Daily  . levETIRAcetam  500 mg Oral BID  . mirtazapine  15 mg Oral QHS  . multivitamin with minerals  1 tablet Oral Daily  . pantoprazole  40 mg Oral BID  . potassium chloride  40 mEq Oral Daily  . romiPLOStim  2 mcg/kg Subcutaneous Weekly  . sodium chloride flush  3 mL Intravenous Once  . thiamine  100 mg Oral Daily   Or  . thiamine  100 mg Intravenous Daily   Continuous Infusions:   LOS: 10 days    Time spent: 25 minutes spent in the coordination of care today.    Jonnie Finner, DO [facility] Pager [phone]  If 7PM-7AM, please contact night-coverage [email or web address] Password [facility] 07/13/2019, 7:55 AM

## 2019-07-13 NOTE — Telephone Encounter (Signed)
Patient called and is requesting a call back

## 2019-07-14 LAB — CBC WITH DIFFERENTIAL/PLATELET
Abs Immature Granulocytes: 0 10*3/uL (ref 0.00–0.07)
Basophils Absolute: 0 10*3/uL (ref 0.0–0.1)
Basophils Relative: 1 %
Eosinophils Absolute: 0.1 10*3/uL (ref 0.0–0.5)
Eosinophils Relative: 2 %
HCT: 36.2 % (ref 36.0–46.0)
Hemoglobin: 11.7 g/dL — ABNORMAL LOW (ref 12.0–15.0)
Immature Granulocytes: 0 %
Lymphocytes Relative: 24 %
Lymphs Abs: 0.6 10*3/uL — ABNORMAL LOW (ref 0.7–4.0)
MCH: 31.2 pg (ref 26.0–34.0)
MCHC: 32.3 g/dL (ref 30.0–36.0)
MCV: 96.5 fL (ref 80.0–100.0)
Monocytes Absolute: 0.4 10*3/uL (ref 0.1–1.0)
Monocytes Relative: 17 %
Neutro Abs: 1.4 10*3/uL — ABNORMAL LOW (ref 1.7–7.7)
Neutrophils Relative %: 56 %
Platelets: 45 10*3/uL — ABNORMAL LOW (ref 150–400)
RBC: 3.75 MIL/uL — ABNORMAL LOW (ref 3.87–5.11)
RDW: 20.2 % — ABNORMAL HIGH (ref 11.5–15.5)
WBC: 2.5 10*3/uL — ABNORMAL LOW (ref 4.0–10.5)
nRBC: 0 % (ref 0.0–0.2)

## 2019-07-14 LAB — COMPREHENSIVE METABOLIC PANEL
ALT: 33 U/L (ref 0–44)
AST: 41 U/L (ref 15–41)
Albumin: 2.1 g/dL — ABNORMAL LOW (ref 3.5–5.0)
Alkaline Phosphatase: 140 U/L — ABNORMAL HIGH (ref 38–126)
Anion gap: 7 (ref 5–15)
BUN: 10 mg/dL (ref 6–20)
CO2: 23 mmol/L (ref 22–32)
Calcium: 8.5 mg/dL — ABNORMAL LOW (ref 8.9–10.3)
Chloride: 107 mmol/L (ref 98–111)
Creatinine, Ser: 0.59 mg/dL (ref 0.44–1.00)
GFR calc Af Amer: >60 mL/min (ref >60)
GFR calc non Af Amer: >60 mL/min (ref >60)
Glucose, Bld: 96 mg/dL (ref 70–99)
Potassium: 4 mmol/L (ref 3.5–5.1)
Sodium: 137 mmol/L (ref 135–145)
Total Bilirubin: 0.9 mg/dL (ref 0.3–1.2)
Total Protein: 8.1 g/dL (ref 6.5–8.1)

## 2019-07-14 LAB — PHOSPHORUS: Phosphorus: 4.2 mg/dL (ref 2.5–4.6)

## 2019-07-14 LAB — MAGNESIUM: Magnesium: 1.7 mg/dL (ref 1.7–2.4)

## 2019-07-14 NOTE — Progress Notes (Signed)
VAST to assess LA midline and perform LC. Upon approaching pt, she stated line was discontinued earlier. Educated pt's nurse to document line dc & complete line in computer.

## 2019-07-14 NOTE — Progress Notes (Addendum)
PROGRESS NOTE    Briana Sweeney  [id number] DOB: Jun 18, 1971 DOA: 07/03/2019 PCP: [name], FNP     Brief Narrative:  Briana Sweeney is a 48 year old Caucasian female with chronic alcoholism, untreated hepatitis C, liver cirrhosis, bipolar disorder, history of coffee-ground emesis in March 2020.  She was admitted with chief complaint of stabbing chest, neck, back pain felt to be musculoskeletal in nature.  She was found to have severe thrombocytopenia.  Hematology/oncology consulted.  New events last 24 hours / Subjective: Does admit to some bleeding in her gums after brushing her teeth.  Assessment & Plan:   Principal Problem:   Thrombocytopenia ([place]) Active Problems:   Alcohol abuse   Alcoholic cirrhosis of liver with ascites (HCC)   Schizophrenia (HCC)   GERD (gastroesophageal reflux disease)   Polysubstance abuse (HCC)    Severe thrombocytopenia (immune-mediated vs hepatitis/EtOH abuse) Pancytopenia - Received 3 units of platelets 10/24-10/25 - Received p.o. prednisone, IVIG, Nplate - Oncology consulted     - Discussed with Dr. Marin Olp briefly this morning     - Platelets improved to 45,000 this morning  EtOHdependence - Discontinue CIWA protocol.  Currently on Librium, continue to wean  Probable upper GI bleed from probable alcoholic gastritis Normocytic anemia, multifactorial  - Prior GI bleed 11/2018 - Episodic coffee-ground emesis on admission - Hemoglobin remained stable currently  Cirrhosis, hep C, with superimposed alcoholic hepatitis - Cessation of alcohol is integral - Hepatitis C-not treated yet; outpatient management  Schizophrenia - Resumed home BuSpar, Abilify, Neurontin, Keppra, Remeron, Prozac   HTN - Amlodipine   DVT prophylaxis: SCD Code Status: Full code Family Communication: None at bedside Disposition Plan: Pending stabilization of platelet count, hopeful discharge home 11/5    Consultants:   Hematology/oncology  Procedures:   None  Antimicrobials:  Anti-infectives (From admission, onward)   None        Objective: Vitals:   07/13/19 1637 07/13/19 2136 07/14/19 0644 07/14/19 0949  BP: 136/74 101/72 104/72 118/84  Pulse: 62 91 72 95  Resp: 18 16 16    Temp: 98.3 F (36.8 C) 98.6 F (37 C) 98.6 F (37 C)   TempSrc: Oral Oral Oral   SpO2: 98% 96% 95%   Weight:      Height:        Intake/Output Summary (Last 24 hours) at 07/14/2019 1251 Last data filed at 07/14/2019 0900 Gross per 24 hour  Intake 970 ml  Output -  Net 970 ml   Filed Weights   07/03/19 1852 07/04/19 1851 07/08/19 0434  Weight: 54.9 kg 59.2 kg 59.2 kg    Examination:  General exam: Appears calm and comfortable  Respiratory system: Clear to auscultation. Respiratory effort normal. No respiratory distress. No conversational dyspnea.  Cardiovascular system: S1 & S2 heard, tachycardic, regular rhythm. No murmurs. No pedal edema. Gastrointestinal system: Abdomen is nondistended, soft and nontender. Normal bowel sounds heard. Central nervous system: Alert and oriented. No focal neurological deficits. Speech clear.  Extremities: Symmetric in appearance  Skin: No rashes, lesions or ulcers on exposed skin, bruising upper extremities Psychiatry: Judgement and insight appear stable  Data Reviewed: I have personally reviewed following labs and imaging studies  CBC: Recent Labs  Lab 07/10/19 0309 07/11/19 0309 07/12/19 0326 07/13/19 0510 07/14/19 0940  WBC 2.4* 2.1* 2.3* 2.7* 2.5*  NEUTROABS 1.5* 1.1* 1.4* 1.7 1.4*  HGB 11.3* 11.0* 11.1* 11.3* 11.7*  HCT 35.4* 33.5* 34.0* 35.4* 36.2  MCV 97.8 96.0 96.0 97.3 96.5  PLT 14* 20*  27* 18* 45*   Basic Metabolic Panel: Recent Labs  Lab 07/08/19 0409 07/09/19 0421 07/10/19 0309 07/12/19 0326 07/13/19 1005 07/14/19 0940  NA 136 137 138 139 137 137  K 3.2* 4.2 3.6 3.9 3.8 4.0  CL 111 110 109 109 105 107  CO2 21* 22 22 24  23 23   GLUCOSE 86 94 97 112* 91 96  BUN 6 7 9 11 9 10   CREATININE 0.51 0.53 0.56 0.62 0.67 0.59  CALCIUM 7.9* 8.3* 8.1* 8.3* 9.0 8.5*  MG 1.6* 1.9 1.8 2.0  --  1.7  PHOS  --  1.8* 3.6 4.7* 4.0 4.2   GFR: Estimated Creatinine Clearance: 68.1 mL/min (by C-G formula based on SCr of 0.59 mg/dL). Liver Function Tests: Recent Labs  Lab 07/08/19 0409 07/09/19 0421 07/10/19 0309 07/12/19 0326 07/13/19 1005 07/14/19 0940  AST 60*  --   --   --   --  41  ALT 28  --   --   --   --  33  ALKPHOS 134*  --   --   --   --  140*  BILITOT 2.0*  --   --   --   --  0.9  PROT 9.4*  --   --   --   --  8.1  ALBUMIN 2.0* 2.0* 2.0* 2.0* 2.2* 2.1*   Recent Labs  Lab 07/11/19 1035  LIPASE 57*   No results for input(s): AMMONIA in the last 168 hours. Coagulation Profile: No results for input(s): INR, PROTIME in the last 168 hours. Cardiac Enzymes: No results for input(s): CKTOTAL, CKMB, CKMBINDEX, TROPONINI in the last 168 hours. BNP (last 3 results) No results for input(s): PROBNP in the last 8760 hours. HbA1C: No results for input(s): HGBA1C in the last 72 hours. CBG: No results for input(s): GLUCAP in the last 168 hours. Lipid Profile: No results for input(s): CHOL, HDL, LDLCALC, TRIG, CHOLHDL, LDLDIRECT in the last 72 hours. Thyroid Function Tests: No results for input(s): TSH, T4TOTAL, FREET4, T3FREE, THYROIDAB in the last 72 hours. Anemia Panel: No results for input(s): VITAMINB12, FOLATE, FERRITIN, TIBC, IRON, RETICCTPCT in the last 72 hours. Sepsis Labs: No results for input(s): PROCALCITON, LATICACIDVEN in the last 168 hours.  Recent Results (from the past 240 hour(s))  MRSA PCR Screening     Status: None   Collection Time: 07/12/19 11:35 AM   Specimen: Nasal Mucosa; Nasopharyngeal  Result Value Ref Range Status   MRSA by PCR NEGATIVE NEGATIVE Final    Comment:        The GeneXpert MRSA Assay (FDA approved for NASAL specimens only), is one component of a comprehensive MRSA  colonization surveillance program. It is not intended to diagnose MRSA infection nor to guide or monitor treatment for MRSA infections. Performed at [facility], [place] [address], [place], [place] [address]       Radiology Studies: No results found.    Scheduled Meds: . amLODipine  5 mg Oral Daily  . ARIPiprazole  5 mg Oral Daily  . busPIRone  10 mg Oral BID  . chlordiazePOXIDE  10 mg Oral BID  . FLUoxetine  40 mg Oral Daily  . folic acid  1 mg Oral Daily  . gabapentin  300 mg Oral BID  . lactulose  20 g Oral Daily  . levETIRAcetam  500 mg Oral BID  . mirtazapine  15 mg Oral QHS  . multivitamin with minerals  1 tablet Oral Daily  . pantoprazole  40 mg Oral BID  . potassium chloride  40 mEq Oral Daily  . romiPLOStim  2 mcg/kg Subcutaneous Weekly  . sodium chloride flush  3 mL Intravenous Once  . thiamine  100 mg Oral Daily   Or  . thiamine  100 mg Intravenous Daily   Continuous Infusions:   LOS: 11 days      Time spent: 45 minutes   Dessa Phi, DO [facility] 07/14/2019, 12:51 PM   Available via Epic secure chat 7am-7pm After these hours, please refer to coverage provider listed on [email or web address]

## 2019-07-14 NOTE — Progress Notes (Signed)
VAST spoke to pt's nurse, Primitivo Gauze regarding pt's midline. Pt is currently not receiving any fluids or meds through line and It does not draw back blood. Educated about decreased risk of infection and need for line to be dc'd if lab results come back with pt's platelet count stable or improved. Primitivo Gauze verbalized understanding.

## 2019-07-14 NOTE — Telephone Encounter (Signed)
Patient informed. 

## 2019-07-15 LAB — CBC
HCT: 34.4 % — ABNORMAL LOW (ref 36.0–46.0)
Hemoglobin: 11.3 g/dL — ABNORMAL LOW (ref 12.0–15.0)
MCH: 31 pg (ref 26.0–34.0)
MCHC: 32.8 g/dL (ref 30.0–36.0)
MCV: 94.5 fL (ref 80.0–100.0)
Platelets: 50 10*3/uL — ABNORMAL LOW (ref 150–400)
RBC: 3.64 MIL/uL — ABNORMAL LOW (ref 3.87–5.11)
RDW: 19.9 % — ABNORMAL HIGH (ref 11.5–15.5)
WBC: 2.2 10*3/uL — ABNORMAL LOW (ref 4.0–10.5)
nRBC: 0 % (ref 0.0–0.2)

## 2019-07-15 MED ORDER — AMLODIPINE BESYLATE 5 MG PO TABS: 5.0000 mg | ORAL_TABLET | Freq: Every day | ORAL | 0 refills | Status: DC

## 2019-07-15 MED ORDER — ROMIPLOSTIM 250 MCG ~~LOC~~ SOLR
1.0000 ug/kg | Freq: Once | SUBCUTANEOUS | Status: AC
  Administered 2019-07-15: 10:00:00 60 ug via SUBCUTANEOUS
  Filled 2019-07-15: qty 0.12

## 2019-07-15 MED ORDER — TBO-FILGRASTIM 480 MCG/0.8ML ~~LOC~~ SOSY
480.0000 ug | PREFILLED_SYRINGE | Freq: Once | SUBCUTANEOUS | Status: AC
  Administered 2019-07-15: 480 ug via SUBCUTANEOUS
  Filled 2019-07-15: qty 0.8

## 2019-07-15 MED FILL — AMLODIPINE BESYLATE 5 MG TA: 5 | 30 days supply | Qty: 30 | Fill #0 | Status: TO

## 2019-07-15 MED FILL — AMLODIPINE BESYLATE 5 MG TA: 5 | 30 days supply | Qty: 30 | Fill #0

## 2019-07-15 NOTE — Discharge Summary (Signed)
Physician Discharge Summary  Briana Sweeney [id number] DOB: 1971-08-05 DOA: 07/03/2019  PCP: [name], FNP  Admit date: 07/03/2019 Discharge date: 07/15/2019  Admitted From: Home Disposition:  Home  Recommendations for Outpatient Follow-up:  1. Follow up with PCP in 1 week 2. Follow up with Dr. Marin Olp in 2-3 weeks  3. Please obtain CBC in 1 week   Discharge Condition: Stable CODE STATUS: Full  Diet recommendation: Regular diet   Brief/Interim Summary: Briana Sweeney is a 48 year old Caucasian female with chronic alcoholism, untreated hepatitis C, liver cirrhosis, bipolar disorder, history of coffee-ground emesis in March 2020.  She was admitted with chief complaint of stabbing chest, neck, back pain felt to be musculoskeletal in nature.  She was found to have severe thrombocytopenia.  Hematology/oncology consulted.  Patient did receive oral steroids, IVIG and Nplate during hospitalization as well as platelet infusion.  Platelet count continue to improve.  Her severe thrombocytopenia was to be secondary to alcohol use induced bone marrow toxicity as well as possible immune mediated.  Patient was encouraged to stop all alcohol use.  Discharge Diagnoses:  Principal Problem:   Thrombocytopenia ([place]) Active Problems:   Alcohol abuse   Alcoholic cirrhosis of liver with ascites (HCC)   Schizophrenia (HCC)   GERD (gastroesophageal reflux disease)   Polysubstance abuse (HCC)    Severe thrombocytopenia (immune-mediated vs hepatitis/EtOH abuse) Pancytopenia - Received 3 units of platelets 10/24-10/25 - Received p.o. prednisone, IVIG, Nplate - Hematology/oncology consulted     - Platelets improved to 50,000 this morning     - Encouraged patient to stop all alcohol use and to follow up with hematology/oncology as outpatient   Leukopenia     - Suspected due to bone marrow toxicity     - Oncology ordered granix prior to discharge   EtOHdependence - Now off  withdrawal window   Probable upper GI bleed from probable alcoholic gastritis Normocytic anemia, multifactorial  - Prior GI bleed 11/2018 - Episodic coffee-ground emesis on admission - Hemoglobin remained stable currently 11.3   Cirrhosis, hep C, with superimposed alcoholic hepatitis - Cessation of alcohol is integral - Hepatitis C-not treated yet; outpatient management  Schizophrenia - Resumed home BuSpar, Abilify, Neurontin, Keppra, Remeron, Prozac   HTN -Amlodipine   Discharge Instructions  Discharge Instructions    Call MD for:   Complete by: As directed    Sign of bleeding   Call MD for:  difficulty breathing, headache or visual disturbances   Complete by: As directed    Call MD for:  extreme fatigue   Complete by: As directed    Call MD for:  persistant dizziness or light-headedness   Complete by: As directed    Call MD for:  persistant nausea and vomiting   Complete by: As directed    Call MD for:  severe uncontrolled pain   Complete by: As directed    Call MD for:  temperature >100.4   Complete by: As directed    Diet general   Complete by: As directed    Discharge instructions   Complete by: As directed    You were cared for by a hospitalist during your hospital stay. If you have any questions about your discharge medications or the care you received while you were in the hospital after you are discharged, you can call the unit and ask to speak with the hospitalist on call if the hospitalist that took care of you is not available. Once you are discharged, your primary care physician  will handle any further medical issues. Please note that NO REFILLS for any discharge medications will be authorized once you are discharged, as it is imperative that you return to your primary care physician (or establish a relationship with a primary care physician if you do not have one) for your aftercare needs so that they can reassess your need for  medications and monitor your lab values.   Increase activity slowly   Complete by: As directed      Allergies as of 07/15/2019      Reactions   Pantoprazole Nausea Only, Other (See Comments)   "This made my stomach burn"      Medication List    STOP taking these medications   furosemide 40 MG tablet Commonly known as: Lasix   pantoprazole 40 MG tablet Commonly known as: PROTONIX     TAKE these medications   alum & mag hydroxide-simeth 200-200-20 MG/5ML suspension Commonly known as: MAALOX/MYLANTA Take 15 mLs by mouth every 4 (four) hours as needed for indigestion or heartburn.   amLODipine 5 MG tablet Commonly known as: NORVASC Take 1 tablet (5 mg total) by mouth daily.   ARIPiprazole 5 MG tablet Commonly known as: Abilify Take 1 tablet (5 mg total) by mouth daily.   aspirin 81 MG chewable tablet Chew 324 mg by mouth as needed (for onset of chest pain).   busPIRone 10 MG tablet Commonly known as: BUSPAR Take 10 mg by mouth 2 (two) times daily.   FLUoxetine 40 MG capsule Commonly known as: PROZAC Take 1 capsule (40 mg total) by mouth daily.   folic acid 1 MG tablet Commonly known as: FOLVITE Take 1 tablet (1 mg total) by mouth daily.   gabapentin 300 MG capsule Commonly known as: NEURONTIN Take 2 capsules 2 times a day. What changed:   how much to take  how to take this  when to take this  additional instructions   hydrOXYzine 25 MG tablet Commonly known as: ATARAX/VISTARIL Take 1 tablet (25 mg total) by mouth every 6 (six) hours as needed for itching.   lactulose 10 GM/15ML solution Commonly known as: CHRONULAC Take 30 mLs (20 g total) by mouth 3 (three) times daily. Goal to have 2-3 BMs daily What changed:   when to take this  additional instructions   levETIRAcetam 500 MG tablet Commonly known as: Keppra Take 1 tablet (500 mg total) by mouth 2 (two) times daily.   LORazepam 1 MG tablet Commonly known as: ATIVAN Take 1 mg by mouth every  8 (eight) hours as needed for anxiety.   mirtazapine 15 MG tablet Commonly known as: Remeron Take 1 tablet (15 mg total) by mouth at bedtime.   multivitamin with minerals Tabs tablet Take 1 tablet by mouth daily.   polyethylene glycol 17 g packet Commonly known as: MIRALAX / GLYCOLAX Take 17 g by mouth 2 (two) times daily. What changed:   when to take this  reasons to take this   Sofosbuvir-Velpatasvir 400-100 MG Tabs Commonly known as: Epclusa Take 1 tablet by mouth daily. Take 1 tablet by mouth daily.   thiamine 100 MG tablet Take 1 tablet (100 mg total) by mouth daily.   topiramate 50 MG tablet Commonly known as: TOPAMAX Take 1 tablet (50 mg total) by mouth daily.   Ventolin HFA 108 (90 Base) MCG/ACT inhaler Generic drug: albuterol Inhale 2 puffs into the lungs every 6 (six) hours as needed for wheezing or shortness of breath.  Follow-up Information    [name], FNP. Schedule an appointment as soon as possible for a visit in 1 week(s).   Specialty: Family Medicine Contact information: [place] [address] [phone]        Volanda Napoleon, MD. Schedule an appointment as soon as possible for a visit in 2 week(s).   Specialty: Oncology Contact information: [place] [address] [phone]          Allergies  Allergen Reactions  . Pantoprazole Nausea Only and Other (See Comments)    "This made my stomach burn"    Consultations:  Hematology oncology   Procedures/Studies: Dg Chest 2 View  Result Date: 07/03/2019 CLINICAL DATA:  Chest pain EXAM: CHEST - 2 VIEW COMPARISON:  02/03/2019. FINDINGS: The heart size and mediastinal contours are within normal limits. Calcified left hilar lymph nodes. Both lungs are clear. The visualized skeletal structures are unremarkable. IMPRESSION: No acute abnormality of the lungs. Electronically Signed   By: Eddie Candle M.D.   On: 07/03/2019 20:13       Discharge Exam: Vitals:   07/14/19 2218 07/15/19 0610  BP: 110/80 107/72  Pulse: 94 72  Resp: 16 16  Temp: 98.3 F (36.8 C) 99.1 F (37.3 C)  SpO2: 94% 99%     General: Pt is alert, awake, not in acute distress Cardiovascular: RRR, S1/S2 +, no edema Respiratory: CTA bilaterally, no wheezing, no rhonchi, no respiratory distress, no conversational dyspnea  Abdominal: Soft, NT, ND, bowel sounds + Extremities: no edema, no cyanosis Psych: Normal mood and affect, stable judgement and insight     The results of significant diagnostics from this hospitalization (including imaging, microbiology, ancillary and laboratory) are listed below for reference.     Microbiology: Recent Results (from the past 240 hour(s))  MRSA PCR Screening     Status: None   Collection Time: 07/12/19 11:35 AM   Specimen: Nasal Mucosa; Nasopharyngeal  Result Value Ref Range Status   MRSA by PCR NEGATIVE NEGATIVE Final    Comment:        The GeneXpert MRSA Assay (FDA approved for NASAL specimens only), is one component of a comprehensive MRSA colonization surveillance program. It is not intended to diagnose MRSA infection nor to guide or monitor treatment for MRSA infections. Performed at [facility], [place] [address] [place], [place] [address]      Labs: BNP (last 3 results) No results for input(s): BNP in the last 8760 hours. Basic Metabolic Panel: Recent Labs  Lab 07/09/19 0421 07/10/19 0309 07/12/19 0326 07/13/19 1005 07/14/19 0940  NA 137 138 139 137 137  K 4.2 3.6 3.9 3.8 4.0  CL 110 109 109 105 107  CO2 22 22 24 23 23   GLUCOSE 94 97 112* 91 96  BUN 7 9 11 9 10   CREATININE 0.53 0.56 0.62 0.67 0.59  CALCIUM 8.3* 8.1* 8.3* 9.0 8.5*  MG 1.9 1.8 2.0  --  1.7  PHOS 1.8* 3.6 4.7* 4.0 4.2   Liver Function Tests: Recent Labs  Lab 07/09/19 0421 07/10/19 0309 07/12/19 0326 07/13/19 1005 07/14/19 0940  AST  --   --   --   --  41  ALT  --   --   --   --  33   ALKPHOS  --   --   --   --  140*  BILITOT  --   --   --   --  0.9  PROT  --   --   --   --  8.1  ALBUMIN 2.0* 2.0* 2.0* 2.2* 2.1*   Recent Labs  Lab 07/11/19 1035  LIPASE 57*   No results for input(s): AMMONIA in the last 168 hours. CBC: Recent Labs  Lab 07/10/19 0309 07/11/19 0309 07/12/19 0326 07/13/19 0510 07/14/19 0940 07/15/19 0249  WBC 2.4* 2.1* 2.3* 2.7* 2.5* 2.2*  NEUTROABS 1.5* 1.1* 1.4* 1.7 1.4*  --   HGB 11.3* 11.0* 11.1* 11.3* 11.7* 11.3*  HCT 35.4* 33.5* 34.0* 35.4* 36.2 34.4*  MCV 97.8 96.0 96.0 97.3 96.5 94.5  PLT 14* 20* 27* 18* 45* 50*   Cardiac Enzymes: No results for input(s): CKTOTAL, CKMB, CKMBINDEX, TROPONINI in the last 168 hours. BNP: Invalid input(s): POCBNP CBG: No results for input(s): GLUCAP in the last 168 hours. D-Dimer No results for input(s): DDIMER in the last 72 hours. Hgb A1c No results for input(s): HGBA1C in the last 72 hours. Lipid Profile No results for input(s): CHOL, HDL, LDLCALC, TRIG, CHOLHDL, LDLDIRECT in the last 72 hours. Thyroid function studies No results for input(s): TSH, T4TOTAL, T3FREE, THYROIDAB in the last 72 hours.  Invalid input(s): FREET3 Anemia work up No results for input(s): VITAMINB12, FOLATE, FERRITIN, TIBC, IRON, RETICCTPCT in the last 72 hours. Urinalysis    Component Value Date/Time   COLORURINE STRAW (A) 07/03/2019 2245   APPEARANCEUR CLEAR 07/03/2019 2245   LABSPEC 1.002 (L) 07/03/2019 2245   PHURINE 5.0 07/03/2019 2245   GLUCOSEU NEGATIVE 07/03/2019 2245   HGBUR NEGATIVE 07/03/2019 2245   BILIRUBINUR NEGATIVE 07/03/2019 2245   BILIRUBINUR negative 10/23/2018 1302   BILIRUBINUR neg 02/20/2018 1248   KETONESUR NEGATIVE 07/03/2019 2245   PROTEINUR NEGATIVE 07/03/2019 2245   UROBILINOGEN 1.0 10/23/2018 1302   UROBILINOGEN 0.2 12/02/2017 0836   NITRITE NEGATIVE 07/03/2019 2245   LEUKOCYTESUR NEGATIVE 07/03/2019 2245   Sepsis Labs Invalid input(s): PROCALCITONIN,  WBC,   LACTICIDVEN Microbiology Recent Results (from the past 240 hour(s))  MRSA PCR Screening     Status: None   Collection Time: 07/12/19 11:35 AM   Specimen: Nasal Mucosa; Nasopharyngeal  Result Value Ref Range Status   MRSA by PCR NEGATIVE NEGATIVE Final    Comment:        The GeneXpert MRSA Assay (FDA approved for NASAL specimens only), is one component of a comprehensive MRSA colonization surveillance program. It is not intended to diagnose MRSA infection nor to guide or monitor treatment for MRSA infections. Performed at [facility], [place] [address] [place], [place] [address]      Patient was seen and examined on the day of discharge and was found to be in stable condition. Time coordinating discharge: 25 minutes including assessment and coordination of care, as well as examination of the patient.   SIGNED:  Dessa Phi, DO [facility] 07/15/2019, 9:40 AM

## 2019-07-15 NOTE — Telephone Encounter (Signed)
[facility] Patient Advocate Encounter   Received notification from [place] that prior authorization for Briana Sweeney is required.   PA submitted on 07/15/2019 Key [id number] W Status is pending- some of the labs are missing they usually require, will follow up and resubmit if they deny for that reason.    Patient has called numerous times to check on this status. We were waiting until her discharge to submit and will call her back once the medication is approved and being filled at [facility].  Bartholomew Crews, CPhT Specialty Pharmacy Patient [facility] Phone: [phone] Fax: [phone] 07/15/2019 1:03 PM

## 2019-07-15 NOTE — Progress Notes (Signed)
Manfred Shirts to be D/C'd Home per MD order.  Discussed with the patient and all questions fully answered.  VSS, Skin clean, dry and intact without evidence of skin break down, no evidence of skin tears noted. IV catheter discontinued intact. Site without signs and symptoms of complications. Dressing and pressure applied.  An After Visit Summary was printed and given to the patient. Patient received prescription.  D/c education completed with patient including follow up instructions, medication list, d/c activities limitations if indicated, with other d/c instructions as indicated by MD - patient able to verbalize understanding, all questions fully answered.   Patient instructed to return to ED, call 911, or call MD for any changes in condition.   Patient escorted via [place], and D/C home via private auto.  Jeanella Craze 07/15/2019 10:31 AM

## 2019-07-15 NOTE — Progress Notes (Signed)
Ms. Fiume platelets are doing better.  Her platelet count today is 50,000.  I am sure a lot of this is just from her not drinking for a couple weeks.  There also may be an immune component.  We did go ahead and give her IVIG and some steroids and I think some Nplate.  She is supposed to be discharged today.  I would be reluctant to put her on steroids as an outpatient as I suspect she probably will drink again and steroids will lead to gastric irritation and the possibility of GI bleeding which she came in with.  I will go ahead and give her a dose of Nplate.  She says she is eating.  She is out of bed.  She is not having any melena or bright red blood per rectum.  She would like some Librium as an outpatient.  Her white cell count is 2.2.  I really not to worried about this.  Again, I suspect this is probably from marrow toxicity.  We could give her dose of Neupogen and see how that works.  She has had no fever.  Her temperature is 99.1.  Her blood pressure is 107/72.  On exam, her lungs sound pretty clear bilaterally.  Cardiac exam regular rate and rhythm.  Abdomen is soft.  There is no guarding or rebound tenderness.  I cannot palpate any obvious splenomegaly or hepatomegaly.  Skin exam does show some ecchymoses which might be getting better.  Again, we will give her dose of Nplate.  I will think would her to give her a dose of Neupogen for the leukopenia.  It would be nice try to get her back to the office in a couple weeks.  Usually, she will not ever show up to our office and I ultimately ended up seeing her when she gets readmitted with bleeding.    Lattie Haw, MD  2 Corinthians 9:8

## 2019-07-15 NOTE — Telephone Encounter (Addendum)
[facility] Patient Advocate Encounter  [place] Medicaid denied the authorization for Epclusa. Will submit new labs and paperwork tomorrow morning. Called the patient and let her know that it would be delayed and we will call her if approved to let her know when to pick up at [facility] so she didn't have to call anymore.  Sent in new labs this morning to [facility] for review. Patient has called several times and was informed that we can't move forward until Medicaid approves the medication.

## 2019-07-16 ENCOUNTER — Other Ambulatory Visit: Payer: Self-pay | Admitting: Family Medicine

## 2019-07-16 MED ORDER — BUSPIRONE HCL 10 MG PO TABS: 10.0000 mg | ORAL_TABLET | Freq: Two times a day (BID) | ORAL | 3 refills | Status: DC

## 2019-07-16 MED FILL — busPIRone HCL 10 MG TABS: 10 | 30 days supply | Qty: 60 | Fill #0

## 2019-07-16 NOTE — Telephone Encounter (Signed)
[facility] Patient Advocate Encounter  Despite additional evidence in the form of labs and imaging being sent electroincally to further support the need for Epclusa for this patient, medicaid has denied the authorization. I spoke with two state caseworkers today, one pharmacist and one a nurse and both explained the patient and what [place] is looking for in regards to her specific case. She emailed me detailed prior authorization criteria and a form we can fill out to get the generic epclusa approved. Medicaid told her there would need to be documented evidence that the patient has tried and failed the two preferred HepC medications but with this patient's current condition, her advice was to try the form one more time before submitting the appeal. I will submit this information Monday morning along with the readiness form. There are twelve boxes that have to be checked to get an approval, we have all but labs indicating F-score. The form will substitute labs and allow [place] to use imaging to show liver condition.     Both state workers told the patient clearly to stop calling them, the pharmacy and the clinic and that multiple people are working on the case and that calling would not speed up the process. She told the patient it would probably take a weeks time to get an approval. We have told her this same information but it has not stopped the multiple phone calls. Will follow-up next week.

## 2019-07-16 NOTE — Telephone Encounter (Signed)
Refilled to pharmacy.

## 2019-07-19 ENCOUNTER — Ambulatory Visit (INDEPENDENT_AMBULATORY_CARE_PROVIDER_SITE_OTHER): Payer: Medicaid Other | Admitting: Psychiatry

## 2019-07-19 ENCOUNTER — Other Ambulatory Visit: Payer: Self-pay

## 2019-07-19 DIAGNOSIS — F1014 Alcohol abuse with alcohol-induced mood disorder: Secondary | ICD-10-CM | POA: Diagnosis not present

## 2019-07-19 DIAGNOSIS — F413 Other mixed anxiety disorders: Secondary | ICD-10-CM

## 2019-07-19 DIAGNOSIS — F419 Anxiety disorder, unspecified: Secondary | ICD-10-CM

## 2019-07-19 MED ORDER — LORAZEPAM 1 MG PO TABS: 1.0000 mg | ORAL_TABLET | Freq: Three times a day (TID) | ORAL | 1 refills | Status: DC | PRN

## 2019-07-19 MED ORDER — FLUOXETINE HCL 40 MG PO CAPS: 40.0000 mg | ORAL_CAPSULE | Freq: Every day | ORAL | 3 refills | Status: DC

## 2019-07-19 MED ORDER — ARIPIPRAZOLE 5 MG PO TABS: 5.0000 mg | ORAL_TABLET | Freq: Every day | ORAL | 1 refills | Status: DC

## 2019-07-19 MED ORDER — MIRTAZAPINE 30 MG PO TABS: 30.0000 mg | ORAL_TABLET | Freq: Every day | ORAL | 1 refills | Status: DC

## 2019-07-19 MED FILL — SOFOSBUVIR-VELPATASVIR 400-: 400-100 | 28 days supply | Qty: 28 | Fill #0

## 2019-07-19 MED FILL — FLUoxetine HCL 40 MG CAPS: 40 | 30 days supply | Qty: 30 | Fill #0

## 2019-07-19 MED FILL — LORazepam 1 MG TABS: 1 | 20 days supply | Qty: 60 | Fill #0

## 2019-07-19 MED FILL — ARIPIPRAZOLE 5 MG TABS: 5 | 30 days supply | Qty: 30 | Fill #0

## 2019-07-19 MED FILL — MIRTAZAPINE 30 MG TABLET: 30 | 30 days supply | Qty: 30 | Fill #0

## 2019-07-19 NOTE — Progress Notes (Signed)
[facility] MD/PA/NP OP Progress Note  07/19/2019 4:17 PM Briana Sweeney  MRN:  [id number] Interview was conducted by phone and I verified that I was speaking with the correct person using two identifiers. I discussed the limitations of evaluation and management by telemedicine and  the availability of in person appointments. Patient expressed understanding and agreed to proceed.  Chief Complaint: Anxiety.  HPI: 48 yo divorced female with a long hx of alcohol use disorder, polysubstance abuse (cocaine, cannabis, heroin, benzodiaepines), and past diagnoses of major depressive disorder vs substance-induced mood disorder vs bipolar disorder. Diagnosis of schizophrenia is also mentioned in the record but patient is unable to provide any clear history of psychotic episodes unrelated to her drug/alcohol use. She was first treated for mental problems at age 72 when she became depressed and OD on meds. She was admitted to [facility] in [place]. She was started on lithium and haloperidol at that time. She has a hx of AVH, mood lability (goes from irritable/anixous to depressed "in minutes"), racing thoughts, anxiety attacks and insomnia. She has been twice in [facility] in 2019 (February and December) for exacerbation of depression, SI while still abusing alcohol.She has been taking fluoxetine (up to 60 mg) for over 20 years per her report. She also tried buspirone and hydroxyzine for anxiety with no clear benefit. Recently prescribed trazodone for insomnia does not appear to wok either. She has never been on any mood stabilizers except for lithium or antipsychotics apart from haloperidol. She did not like how she felt on either of those. She started abusing alcohol at age of 58. At one time she was drinking a 1/2 gallon of vodka plus some beer on a daily basis. She has a hx of alcohol withdrawal delirium. She has some periods of sobriety lasting few months at a time but cannot provide more details.. She has alcohol liver  cirrhosis and also has hepatitis C. She minimizes use of illicit drugs claiming that she only uses them when "partying".  We have added mirtazapine and she reports that sleep has improved. She continues to complain primarily of severe anxiety both generalized and panic type. She has no support, struggles financially. Briana Sweeney reports that she has not been drinking alcohol since Early September this year. She has liver cirrhosis and was recently in hospital with thrombocytopenia.  Visit Diagnosis:    ICD-10-CM   1. Alcohol abuse with alcohol-induced mood disorder ([place])  F10.14   2. Anxiety  F41.9 FLUoxetine (PROZAC) 40 MG capsule  3. Other mixed anxiety disorders  F41.3     Past Psychiatric History: Please see intake H&P.  Past Medical History:  Past Medical History:  Diagnosis Date  . Anxiety   . Bipolar affective disorder ([place])    With anxiety features  . Cirrhosis of liver ([place])    Due to alcohol and hepatitis C  . Depression   . 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 ETOHism ([place])   . GERD (gastroesophageal reflux disease)   . Hematemesis 02/10/2018  . Hepatitis C 2018   hepatitis c and alcohol related hepatitis  . Heroin abuse ([place])   . History of blood transfusion    "blood doesn't clot; I fell down and had to have a transfusion"  . History of kidney stones   . Menopause 2016  . Migraine    "when I get really stressed" (09/01/2017)  . Schizophrenia ([place])   . Seizures ([place])    "when I run out of my RX; lots recently" (  09/01/2017)    Past Surgical History:  Procedure Laterality Date  . ESOPHAGOGASTRODUODENOSCOPY N/A 09/03/2017   Procedure: ESOPHAGOGASTRODUODENOSCOPY (EGD);  Surgeon: Doran Stabler, MD;  Location: [facility] [facility];  Service: Gastroenterology;  Laterality: N/A;  . FINGER FRACTURE SURGERY Left    "shattered my pinky"  . FRACTURE SURGERY    . I&D EXTREMITY Left 09/18/2018   Procedure: IRRIGATION AND DEBRIDEMENT EXTREMITY;  Surgeon: Leanora Cover, MD;   Location: [facility];  Service: Orthopedics;  Laterality: Left;  . IR PARACENTESIS  07/23/2017  . IR PARACENTESIS  07/2017   "did it twice in the same week" (09/01/2017)  . SHOULDER OPEN ROTATOR CUFF REPAIR Right   . TUBAL LIGATION    . VAGINAL HYSTERECTOMY      Family Psychiatric History: Reviewed.  Family History:  Family History  Problem Relation Age of Onset  . Lung cancer Mother 60  . Alcohol abuse Mother   . Throat cancer Father 25    Social History:  Social History   Socioeconomic History  . Marital status: Divorced    Spouse name: Not on file  . Number of children: 2  . Years of education: Not on file  . Highest education level: Not on file  Occupational History  . Occupation: applying for disability  Social Needs  . Financial resource strain: Not on file  . Food insecurity    Worry: Not on file    Inability: Not on file  . Transportation needs    Medical: Not on file    Non-medical: Not on file  Tobacco Use  . Smoking status: Never Smoker  . Smokeless tobacco: Never Used  Substance and Sexual Activity  . Alcohol use: Not Currently    Alcohol/week: 63.0 standard drinks    Types: 63 Cans of beer per week    Comment: weekly "I have cut back"; 4 days 4 vs 12 pack   . Drug use: Yes    Types: Marijuana, Cocaine    Comment: 2 weeks;   . Sexual activity: Not Currently  Lifestyle  . Physical activity    Days per week: Not on file    Minutes per session: Not on file  . Stress: Not on file  Relationships  . Social Herbalist on phone: Not on file    Gets together: Not on file    Attends religious service: Not on file    Active member of club or organization: Not on file    Attends meetings of clubs or organizations: Not on file    Relationship status: Not on file  Other Topics Concern  . Not on file  Social History Narrative   She moved with a boyfriend to [place] and was followed at [facility].  He died of a massive heart attack in  8/18, per her report, and so she moved back to [place] and is living with a friend.    Allergies:  Allergies  Allergen Reactions  . Pantoprazole Nausea Only and Other (See Comments)    "This made my stomach burn"    Metabolic Disorder Labs: Lab Results  Component Value Date   HGBA1C 4.9 10/21/2017   No results found for: PROLACTIN Lab Results  Component Value Date   CHOL 184 05/05/2019   TRIG 114 05/05/2019   HDL 72 05/05/2019   CHOLHDL 2.6 05/05/2019   LDLCALC 89 05/05/2019   LDLCALC 63 05/20/2018   Lab Results  Component Value Date   TSH 1.470 05/05/2019  TSH 1.480 05/20/2018    Therapeutic Level Labs: No results found for: LITHIUM No results found for: VALPROATE No components found for:  CBMZ  Current Medications: Current Outpatient Medications  Medication Sig Dispense Refill  . albuterol (VENTOLIN HFA) 108 (90 Base) MCG/ACT inhaler Inhale 2 puffs into the lungs every 6 (six) hours as needed for wheezing or shortness of breath.    [name] Kitchen alum & mag hydroxide-simeth (MAALOX/MYLANTA) 200-200-20 MG/5ML suspension Take 15 mLs by mouth every 4 (four) hours as needed for indigestion or heartburn. 355 mL 0  . amLODipine (NORVASC) 5 MG tablet Take 1 tablet (5 mg total) by mouth daily. 30 tablet 0  . ARIPiprazole (ABILIFY) 5 MG tablet Take 1 tablet (5 mg total) by mouth daily. 30 tablet 1  . aspirin 81 MG chewable tablet Chew 324 mg by mouth as needed (for onset of chest pain).    . busPIRone (BUSPAR) 10 MG tablet Take 1 tablet (10 mg total) by mouth 2 (two) times daily. 30 tablet 3  . FLUoxetine (PROZAC) 40 MG capsule Take 1 capsule (40 mg total) by mouth daily. 30 capsule 3  . folic acid (FOLVITE) 1 MG tablet Take 1 tablet (1 mg total) by mouth daily. 30 tablet 3  . gabapentin (NEURONTIN) 300 MG capsule Take 2 capsules 2 times a day. (Patient taking differently: Take 600 mg by mouth 2 (two) times daily. ) 120 capsule 3  . hydrOXYzine (ATARAX/VISTARIL) 25 MG tablet Take 1  tablet (25 mg total) by mouth every 6 (six) hours as needed for itching. 90 tablet 1  . lactulose (CHRONULAC) 10 GM/15ML solution Take 30 mLs (20 g total) by mouth 3 (three) times daily. Goal to have 2-3 BMs daily (Patient taking differently: Take 20 g by mouth 2 (two) times daily. Take 20 grams (30 ml's) by mouth two times a day- goal to have 2-3 BMs daily) 240 mL 3  . levETIRAcetam (KEPPRA) 500 MG tablet Take 1 tablet (500 mg total) by mouth 2 (two) times daily. 60 tablet 3  . LORazepam (ATIVAN) 1 MG tablet Take 1 tablet (1 mg total) by mouth every 8 (eight) hours as needed for anxiety. 60 tablet 1  . mirtazapine (REMERON) 30 MG tablet Take 1 tablet (30 mg total) by mouth at bedtime. 30 tablet 1  . Multiple Vitamin (MULTIVITAMIN WITH MINERALS) TABS tablet Take 1 tablet by mouth daily.    . polyethylene glycol (MIRALAX / GLYCOLAX) 17 g packet Take 17 g by mouth 2 (two) times daily. (Patient taking differently: Take 17 g by mouth daily as needed for mild constipation or moderate constipation. ) 14 each 0  . Sofosbuvir-Velpatasvir (EPCLUSA) 400-100 MG TABS Take 1 tablet by mouth daily. Take 1 tablet by mouth daily. 28 tablet 5  . thiamine 100 MG tablet Take 1 tablet (100 mg total) by mouth daily. (Patient not taking: Reported on 07/03/2019) 30 tablet 0  . topiramate (TOPAMAX) 50 MG tablet Take 1 tablet (50 mg total) by mouth daily. 30 tablet 2   No current facility-administered medications for this visit.       Psychiatric Specialty Exam: Review of Systems  Constitutional: Positive for malaise/fatigue.  Psychiatric/Behavioral: The patient is nervous/anxious.   All other systems reviewed and are negative.   There were no vitals taken for this visit.There is no height or weight on file to calculate BMI.  General Appearance: NA  Eye Contact:  NA  Speech:  Clear and Coherent and Normal Rate  Volume:  Normal  Mood:  Anxious and Depressed  Affect:  NA  Thought Process:  Goal Directed   Orientation:  Full (Time, Place, and Person)  Thought Content: Logical   Suicidal Thoughts:  No  Homicidal Thoughts:  No  Memory:  Immediate;   Fair Recent;   Fair Remote;   Good  Judgement:  Fair  Insight:  Fair  Psychomotor Activity:  NA  Concentration:  Concentration: Good  Recall:  [place] of Knowledge: Fair  Language: Good  Akathisia:  Negative  Handed:  Right  AIMS (if indicated): not done  Assets:  Communication Skills Desire for Improvement Housing Resilience  ADL's:  Intact  Cognition: WNL  Sleep:  Fair   Screenings: AIMS     Admission (Discharged) from 08/25/2018 in [facility] Admission (Discharged) from 10/28/2017 in [facility]  AIMS Total Score  0  0    AUDIT     Admission (Discharged) from 08/25/2018 in [facility] Admission (Discharged) from 10/28/2017 in [facility]  Alcohol Use Disorder Identification Test Final Score (AUDIT)  40  32    PHQ2-9     Office Visit from 05/05/2019 in [facility] Office Visit from 04/22/2019 in [facility] for Infectious Disease Office Visit from 02/19/2019 in [facility] Office Visit from 09/29/2018 in [facility] Office Visit from 12/16/2017 in [facility] for Infectious Disease  PHQ-2 Total Score  0  0  0  0  1       Assessment and Plan: 48 yo divorced female with a long hx of alcohol use disorder, polysubstance abuse (cocaine, cannabis, heroin, benzodiaepines), and past diagnoses of major depressive disorder vs substance-induced mood disorder vs bipolar disorder. Diagnosis of schizophrenia is also mentioned in the record but patient is unable to provide any clear history of psychotic episodes unrelated to her drug/alcohol use. She was first treated for mental problems at age 46 when she became depressed and  OD on meds. She was admitted to [facility] in [place]. She was started on lithium and haloperidol at that time. She has a hx of AVH, mood lability (goes from irritable/anixous to depressed "in minutes"), racing thoughts, anxiety attacks and insomnia. She has been twice in [facility] in 2019 (February and December) for exacerbation of depression, SI while still abusing alcohol.She has been taking fluoxetine (up to 60 mg) for over 20 years per her report. She also tried buspirone and hydroxyzine for anxiety with no clear benefit. Recently prescribed trazodone for insomnia does not appear to wok either. She has never been on any mood stabilizers except for lithium or antipsychotics apart from haloperidol. She did not like how she felt on either of those. She started abusing alcohol at age of 65. At one time she was drinking a 1/2 gallon of vodka plus some beer on a daily basis. She has a hx of alcohol withdrawal delirium. She has some periods of sobriety lasting few months at a time but cannot provide more details.. 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 She minimizes use of illicit drugs claiming that she only uses them when "partying".  We have added mirtazapine and she reports that sleep has improved. She continues to complain primarily of severe anxiety both generalized and panic type. She has no support, struggles financially. Briana Sweeney reports that she has not  been drinking alcohol since Early September this year.   Dx: Mixed anxiety disorder; Mood disorder unspecified (Drug-induced vs bipolar disorder unspecified); Alcohol use disorder severe in early remission; Polysubstance use disorder  Plan:  We will continue fluoxetine 40  mg daily, Abilify 5 mg, increase Remerpon to 30 mg and add lorazepam 1 mg prn anxiety (bid-tid). She has also Rx for Buspar but does not take it - "It does not work". Next appointment in 8 weeks. The plan was discussed with patient who had an opportunity to ask  questions and these were all answered. I spend 25 minutes in phone consultation with the patient.     Stephanie Acre, MD 07/19/2019, 4:17 PM

## 2019-07-19 NOTE — Telephone Encounter (Signed)
[facility] Patient Advocate Encounter  Prior Authorization for Raeanne Gathers has been approved.    PA# [id number] Effective dates: 07/19/2019 through 09/17/2019  Patients co-pay is $3.00   [facility] will let patient know so she can pick up the medication at [facility].  Bartholomew Crews, CPhT Specialty Pharmacy Patient [facility] for Infectious Disease Phone: [phone] Fax: [phone] 07/19/2019 11:38 AM

## 2019-07-20 ENCOUNTER — Encounter: Payer: Self-pay | Admitting: Pharmacy Technician

## 2019-07-21 ENCOUNTER — Telehealth: Payer: Self-pay | Admitting: Pharmacist

## 2019-07-21 NOTE — Telephone Encounter (Signed)
Thanks Tyler! 

## 2019-07-26 ENCOUNTER — Ambulatory Visit: Payer: Medicaid Other | Admitting: Podiatry

## 2019-07-28 MED FILL — hydrOXYzine HCL 25 MG TABS: 25 | 22 days supply | Qty: 90 | Fill #1

## 2019-08-06 ENCOUNTER — Ambulatory Visit: Payer: Self-pay | Admitting: Family Medicine

## 2019-08-09 ENCOUNTER — Ambulatory Visit: Payer: Medicaid Other | Admitting: Podiatry

## 2019-08-09 ENCOUNTER — Ambulatory Visit (INDEPENDENT_AMBULATORY_CARE_PROVIDER_SITE_OTHER): Payer: Medicaid Other

## 2019-08-09 ENCOUNTER — Other Ambulatory Visit: Payer: Self-pay | Admitting: Podiatry

## 2019-08-09 ENCOUNTER — Other Ambulatory Visit: Payer: Self-pay

## 2019-08-09 DIAGNOSIS — M21611 Bunion of right foot: Secondary | ICD-10-CM | POA: Diagnosis not present

## 2019-08-09 DIAGNOSIS — M79671 Pain in right foot: Secondary | ICD-10-CM

## 2019-08-09 DIAGNOSIS — M216X1 Other acquired deformities of right foot: Secondary | ICD-10-CM

## 2019-08-09 DIAGNOSIS — M21621 Bunionette of right foot: Secondary | ICD-10-CM

## 2019-08-09 NOTE — Patient Instructions (Signed)
Pre-Operative Instructions  Congratulations, you have decided to take an important step towards improving your quality of life.  You can be assured that the doctors and staff at [facility] will be with you every step of the way.  Here are some important things you should know:  1. Plan to be at the surgery center/hospital at least 1 (one) hour prior to your scheduled time, unless otherwise directed by the surgical center/hospital staff.  You must have a responsible adult accompany you, remain during the surgery and drive you home.  Make sure you have directions to the surgical center/hospital to ensure you arrive on time. 2. If you are having surgery at [facility] or [facility] hospitals, you will need a copy of your medical history and physical form from your family physician within one month prior to the date of surgery. We will give you a form for your primary physician to complete.  3. We make every effort to accommodate the date you request for surgery.  However, there are times where surgery dates or times have to be moved.  We will contact you as soon as possible if a change in schedule is required.   4. No aspirin/ibuprofen for one week before surgery.  If you are on aspirin, any non-steroidal anti-inflammatory medications (Mobic, Aleve, Ibuprofen) should not be taken seven (7) days prior to your surgery.  You make take Tylenol for pain prior to surgery.  5. Medications - If you are taking daily heart and blood pressure medications, seizure, reflux, allergy, asthma, anxiety, pain or diabetes medications, make sure you notify the surgery center/hospital before the day of surgery so they can tell you which medications you should take or avoid the day of surgery. 6. No food or drink after midnight the night before surgery unless directed otherwise by surgical center/hospital staff. 7. No alcoholic beverages 24-hours prior to surgery.  No smoking 24-hours prior or 24-hours after  surgery. 8. Wear loose pants or shorts. They should be loose enough to fit over bandages, boots, and casts. 9. Don't wear slip-on shoes. Sneakers are preferred. 10. Bring your boot with you to the surgery center/hospital.  Also bring crutches or a walker if your physician has prescribed it for you.  If you do not have this equipment, it will be provided for you after surgery. 11. If you have not been contacted by the surgery center/hospital by the day before your surgery, call to confirm the date and time of your surgery. 12. Leave-time from work may vary depending on the type of surgery you have.  Appropriate arrangements should be made prior to surgery with your employer. 13. Prescriptions will be provided immediately following surgery by your doctor.  Fill these as soon as possible after surgery and take the medication as directed. Pain medications will not be refilled on weekends and must be approved by the doctor. 14. Remove nail polish on the operative foot and avoid getting pedicures prior to surgery. 15. Wash the night before surgery.  The night before surgery wash the foot and leg well with water and the antibacterial soap provided. Be sure to pay special attention to beneath the toenails and in between the toes.  Wash for at least three (3) minutes. Rinse thoroughly with water and dry well with a towel.  Perform this wash unless told not to do so by your physician.  Enclosed: 1 Ice pack (please put in freezer the night before surgery)   1 Hibiclens skin cleaner     Pre-op instructions  If you have any questions regarding the instructions, please do not hesitate to call our office.  [place]: [address], [place], [place] [address] -- [phone]  [place]: [address] [place], [place] [address] -- [phone]  [place]: [address]  [place], [place] [address] -- [phone]   Website: [email or web address] 

## 2019-08-10 HISTORY — PX: TOOTH EXTRACTION: SUR596

## 2019-08-11 ENCOUNTER — Telehealth: Payer: Self-pay | Admitting: Family Medicine

## 2019-08-11 ENCOUNTER — Other Ambulatory Visit: Payer: Self-pay

## 2019-08-11 DIAGNOSIS — G629 Polyneuropathy, unspecified: Secondary | ICD-10-CM

## 2019-08-11 MED ORDER — GABAPENTIN 300 MG PO CAPS: ORAL_CAPSULE | ORAL | 3 refills | Status: DC

## 2019-08-11 MED FILL — GABAPENTIN 300 MG CAPSULE: 300 | 30 days supply | Qty: 120 | Fill #0

## 2019-08-11 NOTE — Telephone Encounter (Signed)
Patient requested her [place] appointment to be rescheduled. The new appointment is on 08/20/2019 at 12:40. Patient is aware of new time (ph# [phone]). She is also aware or the importance of keep this appointment.

## 2019-08-12 NOTE — Progress Notes (Signed)
   Subjective: 48 y.o. female presenting today as a new patient with a chief complaint of a painful bunion of the right foot that began gradually worsening about one year ago. Wearing shoes and walking for long periods of time increases the pain. She has been wearing supportive shoes and taking OTC pain medication for treatment. Patient is here for further evaluation and treatment.   Past Medical History:  Diagnosis Date  . Anxiety   . Bipolar affective disorder ([place])    With anxiety features  . Cirrhosis of liver ([place])    Due to alcohol and hepatitis C  . Depression   . 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 ETOHism ([place])   . GERD (gastroesophageal reflux disease)   . Hematemesis 02/10/2018  . Hepatitis C 2018   hepatitis c and alcohol related hepatitis  . Heroin abuse ([place])   . History of blood transfusion    "blood doesn't clot; I fell down and had to have a transfusion"  . History of kidney stones   . Menopause 2016  . Migraine    "when I get really stressed" (09/01/2017)  . Schizophrenia ([place])   . Seizures ([place])    "when I run out of my RX; lots recently" (09/01/2017)     Objective: Physical Exam General: The patient is alert and oriented x3 in no acute distress.  Dermatology: Skin is cool, dry and supple bilateral lower extremities. Negative for open lesions or macerations.  Vascular: Palpable pedal pulses bilaterally. No edema or erythema noted. Capillary refill within normal limits.  Neurological: Epicritic and protective threshold grossly intact bilaterally.   Musculoskeletal Exam: Clinical evidence of Tailor's bunion deformity noted to the respective foot. There is a moderate pain on palpation range of motion of the fifth MPJ.  Clinical evidence of bunion deformity noted to the respective foot. There is moderate pain on palpation range of motion of the first MPJ. Lateral deviation of the hallux noted consistent with hallux abductovalgus.  Radiographic Exam:  Increased intermetatarsal angle to the fourth interspace of the respective foot. Prominent fifth metatarsal head. Joint spaces preserved.  Increased intermetatarsal angle greater than 15 with a hallux abductus angle greater than 30 noted on AP view. Moderate degenerative changes noted within the first MPJ.   Assessment: 1. Tailor's bunion deformity right 2. HAV w/ bunion deformity right     Plan of Care:  1. Patient was evaluated. X-Rays reviewed.  2. Today we discussed the conservative versus surgical management of the presenting pathology. The patient opts for surgical management. All possible complications and details of the procedure were explained. All patient questions were answered. No guarantees were expressed or implied. 3. Authorization for surgery was initiated today. Surgery will consist of bunionectomy with osteotomy right; Tailor's bunionectomy with osteotomy right.  4. Return to clinic one week post op.       Edrick Kins, DPM [facility]  Dr. Edrick Kins, [address]                                        [place], [place] [address]                Office [phone]  Fax [phone]

## 2019-08-13 ENCOUNTER — Encounter: Payer: Self-pay | Admitting: Family Medicine

## 2019-08-13 ENCOUNTER — Ambulatory Visit (INDEPENDENT_AMBULATORY_CARE_PROVIDER_SITE_OTHER): Payer: Medicaid Other | Admitting: Family Medicine

## 2019-08-13 ENCOUNTER — Other Ambulatory Visit: Payer: Self-pay

## 2019-08-13 VITALS — BP 132/88 | HR 99 | Temp 98.1°F | Ht 59.0 in | Wt 133.4 lb

## 2019-08-13 DIAGNOSIS — R739 Hyperglycemia, unspecified: Secondary | ICD-10-CM | POA: Diagnosis not present

## 2019-08-13 DIAGNOSIS — R8290 Unspecified abnormal findings in urine: Secondary | ICD-10-CM

## 2019-08-13 DIAGNOSIS — D696 Thrombocytopenia, unspecified: Secondary | ICD-10-CM | POA: Diagnosis not present

## 2019-08-13 DIAGNOSIS — F419 Anxiety disorder, unspecified: Secondary | ICD-10-CM

## 2019-08-13 DIAGNOSIS — Z98818 Other dental procedure status: Secondary | ICD-10-CM

## 2019-08-13 DIAGNOSIS — Z0000 Encounter for general adult medical examination without abnormal findings: Secondary | ICD-10-CM

## 2019-08-13 DIAGNOSIS — K068 Other specified disorders of gingiva and edentulous alveolar ridge: Secondary | ICD-10-CM

## 2019-08-13 DIAGNOSIS — G4700 Insomnia, unspecified: Secondary | ICD-10-CM

## 2019-08-13 DIAGNOSIS — N390 Urinary tract infection, site not specified: Secondary | ICD-10-CM

## 2019-08-13 DIAGNOSIS — R569 Unspecified convulsions: Secondary | ICD-10-CM

## 2019-08-13 DIAGNOSIS — R319 Hematuria, unspecified: Secondary | ICD-10-CM

## 2019-08-13 DIAGNOSIS — Z09 Encounter for follow-up examination after completed treatment for conditions other than malignant neoplasm: Secondary | ICD-10-CM

## 2019-08-13 DIAGNOSIS — F1010 Alcohol abuse, uncomplicated: Secondary | ICD-10-CM

## 2019-08-13 DIAGNOSIS — G629 Polyneuropathy, unspecified: Secondary | ICD-10-CM

## 2019-08-13 LAB — POCT GLYCOSYLATED HEMOGLOBIN (HGB A1C): Hemoglobin A1C: 4.8 % (ref 4.0–5.6)

## 2019-08-13 LAB — POCT URINALYSIS DIPSTICK
Bilirubin, UA: NEGATIVE
Glucose, UA: NEGATIVE
Ketones, UA: NEGATIVE
Nitrite, UA: NEGATIVE
Protein, UA: NEGATIVE
Spec Grav, UA: 1.02 (ref 1.010–1.025)
Urobilinogen, UA: 1 E.U./dL
pH, UA: 8.5 — AB (ref 5.0–8.0)

## 2019-08-13 LAB — GLUCOSE, POCT (MANUAL RESULT ENTRY): POC Glucose: 124 mg/dl — AB (ref 70–99)

## 2019-08-13 MED ORDER — SULFAMETHOXAZOLE-TRIMETHOPRIM 800-160 MG PO TABS: 1.0000 | ORAL_TABLET | Freq: Two times a day (BID) | ORAL | 0 refills | Status: DC

## 2019-08-13 MED FILL — SULFAMETHOXAZOLE-TMP DS TAB: 800-160 | 7 days supply | Qty: 14 | Fill #0

## 2019-08-13 NOTE — Patient Instructions (Signed)
Urinary Tract Infection, Adult A urinary tract infection (UTI) is an infection of any part of the urinary tract. The urinary tract includes:  The kidneys.  The ureters.  The bladder.  The urethra. These organs make, store, and get rid of pee (urine) in the body. What are the causes? This is caused by germs (bacteria) in your genital area. These germs grow and cause swelling (inflammation) of your urinary tract. What increases the risk? You are more likely to develop this condition if:  You have a small, thin tube (catheter) to drain pee.  You cannot control when you pee or poop (incontinence).  You are female, and: ? You use these methods to prevent pregnancy: ? A medicine that kills sperm (spermicide). ? A device that blocks sperm (diaphragm). ? You have low levels of a female hormone (estrogen). ? You are pregnant.  You have genes that add to your risk.  You are sexually active.  You take antibiotic medicines.  You have trouble peeing because of: ? A prostate that is bigger than normal, if you are female. ? A blockage in the part of your body that drains pee from the bladder (urethra). ? A kidney stone. ? A nerve condition that affects your bladder (neurogenic bladder). ? Not getting enough to drink. ? Not peeing often enough.  You have other conditions, such as: ? Diabetes. ? A weak disease-fighting system (immune system). ? Sickle cell disease. ? Gout. ? Injury of the spine. What are the signs or symptoms? Symptoms of this condition include:  Needing to pee right away (urgently).  Peeing often.  Peeing small amounts often.  Pain or burning when peeing.  Blood in the pee.  Pee that smells bad or not like normal.  Trouble peeing.  Pee that is cloudy.  Fluid coming from the vagina, if you are female.  Pain in the belly or lower back. Other symptoms include:  Throwing up (vomiting).  No urge to eat.  Feeling mixed up (confused).  Being tired  and grouchy (irritable).  A fever.  Watery poop (diarrhea). How is this treated? This condition may be treated with:  Antibiotic medicine.  Other medicines.  Drinking enough water. Follow these instructions at home:  Medicines  Take over-the-counter and prescription medicines only as told by your doctor.  If you were prescribed an antibiotic medicine, take it as told by your doctor. Do not stop taking it even if you start to feel better. General instructions  Make sure you: ? Pee until your bladder is empty. ? Do not hold pee for a long time. ? Empty your bladder after sex. ? Wipe from front to back after pooping if you are a female. Use each tissue one time when you wipe.  Drink enough fluid to keep your pee pale yellow.  Keep all follow-up visits as told by your doctor. This is important. Contact a doctor if:  You do not get better after 1-2 days.  Your symptoms go away and then come back. Get help right away if:  You have very bad back pain.  You have very bad pain in your lower belly.  You have a fever.  You are sick to your stomach (nauseous).  You are throwing up. Summary  A urinary tract infection (UTI) is an infection of any part of the urinary tract.  This condition is caused by germs in your genital area.  There are many risk factors for a UTI. These include having a small, thin   tube to drain pee and not being able to control when you pee or poop.  Treatment includes antibiotic medicines for germs.  Drink enough fluid to keep your pee pale yellow. This information is not intended to replace advice given to you by your health care provider. Make sure you discuss any questions you have with your health care provider. Document Released: 02/12/2008 Document Revised: 08/13/2018 Document Reviewed: 03/05/2018 Elsevier Patient Education  [place]. Sulfamethoxazole; Trimethoprim, SMX-TMP tablets What is this medicine? SULFAMETHOXAZOLE;  TRIMETHOPRIM or SMX-TMP (suhl fuh meth OK suh zohl; trye METH oh prim) is a combination of a sulfonamide antibiotic and a second antibiotic, trimethoprim. It is used to treat or prevent certain kinds of bacterial infections. It will not work for colds, flu, or other viral infections. This medicine may be used for other purposes; ask your health care provider or pharmacist if you have questions. COMMON BRAND NAME(S): Bacter-Aid DS, Bactrim, Bactrim DS, Septra, Septra DS What should I tell my health care provider before I take this medicine? They need to know if you have any of these conditions:  anemia  asthma  being treated with anticonvulsants  if you frequently drink alcohol containing drinks  kidney disease  liver disease  low level of folic acid or [id number] dehydrogenase  poor nutrition or malabsorption  porphyria  severe allergies  thyroid disorder  an unusual or allergic reaction to sulfamethoxazole, trimethoprim, sulfa drugs, other medicines, foods, dyes, or preservatives  pregnant or trying to get pregnant  breast-feeding How should I use this medicine? Take this medicine by mouth with a full glass of water. Follow the directions on the prescription label. Take your medicine at regular intervals. Do not take it more often than directed. Do not skip doses or stop your medicine early. Talk to your pediatrician regarding the use of this medicine in children. Special care may be needed. This medicine has been used in children as young as 26 months of age. Overdosage: If you think you have taken too much of this medicine contact a poison control center or emergency room at once. NOTE: This medicine is only for you. Do not share this medicine with others. What if I miss a dose? If you miss a dose, take it as soon as you can. If it is almost time for your next dose, take only that dose. Do not take double or extra doses. What may interact with this medicine? Do not  take this medicine with any of the following medications:  aminobenzoate potassium  dofetilide  metronidazole This medicine may also interact with the following medications:  ACE inhibitors like benazepril, enalapril, lisinopril, and ramipril  birth control pills  cyclosporine  digoxin  diuretics  indomethacin  medicines for diabetes  methenamine  methotrexate  phenytoin  potassium supplements  pyrimethamine  sulfinpyrazone  tricyclic antidepressants  warfarin This list may not describe all possible interactions. Give your health care provider a list of all the medicines, herbs, non-prescription drugs, or dietary supplements you use. Also tell them if you smoke, drink alcohol, or use illegal drugs. Some items may interact with your medicine. What should I watch for while using this medicine? Tell your doctor or health care professional if your symptoms do not improve. Drink several glasses of water a day to reduce the risk of kidney problems. Do not treat diarrhea with over the counter products. Contact your doctor if you have diarrhea that lasts more than 2 days or if it is severe and watery.  This medicine can make you more sensitive to the sun. Keep out of the sun. If you cannot avoid being in the sun, wear protective clothing and use a sunscreen. Do not use sun lamps or tanning beds/booths. What side effects may I notice from receiving this medicine? Side effects that you should report to your doctor or health care professional as soon as possible:  allergic reactions like skin rash or hives, swelling of the face, lips, or tongue  breathing problems  fever or chills, sore throat  irregular heartbeat, chest pain  joint or muscle pain  pain or difficulty passing urine  red pinpoint spots on skin  redness, blistering, peeling or loosening of the skin, including inside the mouth  unusual bleeding or bruising  unusually weak or tired  yellowing of the  eyes or skin Side effects that usually do not require medical attention (report to your doctor or health care professional if they continue or are bothersome):  diarrhea  dizziness  headache  loss of appetite  nausea, vomiting  nervousness This list may not describe all possible side effects. Call your doctor for medical advice about side effects. You may report side effects to FDA at 1-800-FDA-1088. Where should I keep my medicine? Keep out of the reach of children. Store at room temperature between 20 to 25 degrees C (68 to 77 degrees F). Protect from light. Throw away any unused medicine after the expiration date. NOTE: This sheet is a summary. It may not cover all possible information. If you have questions about this medicine, talk to your doctor, pharmacist, or health care provider.  2020 Elsevier/Gold Standard (2013-04-02 14:38:26)

## 2019-08-13 NOTE — Progress Notes (Signed)
[facility]   Established Patient Office Visit  Subjective:  Patient ID: Briana Sweeney, female    DOB: [birth year]  Age: 48 y.o. MRN: [id number]  CC:  Chief Complaint  Patient presents with   Follow-up    HPI Briana Sweeney is a 48 year old female who presents for Follow U today.   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[facility])    ETOHism (H[facility])    GERD (gastroesophageal reflux disease)    Hematemesis 02/10/2018   Hepatitis C 2018   hepatitis c and alcohol related hepatitis   Heroin abuse ([place])    History of blood transfusion    "blood doesn't clot; I fell down and had to have a transfusion"   History of kidney stones    Menopause 2016   Migraine    "when I get really stressed" (09/01/2017)   Schizophrenia ([place])    Seizures ([place])    "when I run out of my RX; lots recently" (09/01/2017)   Current Status: Since her last office visit, she is doing well with no complShe denies easy bruising, petechiae, gingival bleeding, epistaxis, profuse bleeding from superficial cuts, menorrhagia, mentrorrhagia, hematochezia, and hematuria. She recently had 2 teeth extracted 2 days ago. She did have excessive bleeding and is concerned about platelet count. She states that she has been drinking less lately. She has only had one alcoholic beverage in a few days now. Her anxiety is a lot better, she states, now that she is on Ativan. She continues to have Telephone visits with Psychiatrist. She denies suicidal ideations, homicidal ideations, or auditory hallucinations. She also continues to follow up with Infection Disease for Hepatitis C, and takes antiviral medications as needed. She is scheduled for 'bunion' foot surgery on 09/29/2018. She denies fevers,  chills, fatigue, recent infections, weight loss, and night sweats. She has not had any headaches, visual changes, dizziness, and falls. No chest pain, heart palpitations, cough and shortness of breath reported. No reports of GI problems such as nausea, vomiting, diarrhea, and constipation. She has no reports of blood in stools, dysuria and hematuria. She denies pain today.   Past Surgical History:  Procedure Laterality Date   ESOPHAGOGASTRODUODENOSCOPY N/A 09/03/2017   Procedure: ESOPHAGOGASTRODUODENOSCOPY (EGD);  Surgeon: Doran Stabler, MD;  Location: [facility];  Service: Gastroenterology;  Laterality: N/A;   FINGER FRACTURE SURGERY Left    "shattered my pinky"   FRACTURE SURGERY     I&D EXTREMITY Left 09/18/2018   Procedure: IRRIGATION AND DEBRIDEMENT EXTREMITY;  Surgeon: Leanora Cover, MD;  Location: [facility];  Service: Orthopedics;  Laterality: Left;   IR PARACENTESIS  07/23/2017   IR PARACENTESIS  07/2017   "did it twice in the same week" (09/01/2017)   SHOULDER OPEN ROTATOR CUFF REPAIR Right    TOOTH EXTRACTION  08/2019   TUBAL LIGATION     VAGINAL HYSTERECTOMY      Family History  Problem Relation Age of Onset   Lung cancer Mother 58   Alcohol abuse Mother    Throat cancer Father 70    Social History   Socioeconomic History   Marital status: Divorced    Spouse name: Not on file   Number of children: 2  Years of education: Not on file   Highest education level: Not on file  Occupational History   Occupation: applying for disability  Social Needs   Financial resource strain: Not on file   Food insecurity    Worry: Not on file    Inability: Not on file   Transportation needs    Medical: Not on file    Non-medical: Not on file  Tobacco Use   Smoking status: Never Smoker   Smokeless tobacco: Never Used  Substance and Sexual Activity   Alcohol use: Not Currently    Alcohol/week: 63.0 standard drinks    Types: 63 Cans of beer per week     Comment: weekly "I have cut back"; 4 days 4 vs 12 pack    Drug use: Not Currently    Types: Marijuana, Cocaine    Comment: 2 weeks;    Sexual activity: Yes  Lifestyle   Physical activity    Days per week: Not on file    Minutes per session: Not on file   Stress: Not on file  Relationships   Social connections    Talks on phone: Not on file    Gets together: Not on file    Attends religious service: Not on file    Active member of club or organization: Not on file    Attends meetings of clubs or organizations: Not on file    Relationship status: Not on file   Intimate partner violence    Fear of current or ex partner: Not on file    Emotionally abused: Not on file    Physically abused: Not on file    Forced sexual activity: Not on file  Other Topics Concern   Not on file  Social History Narrative   She moved with a boyfriend to [place] and was followed at [facility].  He died of a massive heart attack in 8/18, per her report, and so she moved back to [place] and is living with a friend.    Outpatient Medications Prior to Visit  Medication Sig Dispense Refill   albuterol (VENTOLIN HFA) 108 (90 Base) MCG/ACT inhaler Inhale 2 puffs into the lungs every 6 (six) hours as needed for wheezing or shortness of breath.     alum & mag hydroxide-simeth (MAALOX/MYLANTA) 200-200-20 MG/5ML suspension Take 15 mLs by mouth every 4 (four) hours as needed for indigestion or heartburn. 355 mL 0   amLODipine (NORVASC) 5 MG tablet Take 1 tablet (5 mg total) by mouth daily. 30 tablet 0   ARIPiprazole (ABILIFY) 5 MG tablet Take 1 tablet (5 mg total) by mouth daily. 30 tablet 1   aspirin 81 MG chewable tablet Chew 324 mg by mouth as needed (for onset of chest pain).     busPIRone (BUSPAR) 10 MG tablet Take 1 tablet (10 mg total) by mouth 2 (two) times daily. 30 tablet 3   FLUoxetine (PROZAC) 40 MG capsule Take 1 capsule (40 mg total) by mouth daily. 30 capsule 3   folic acid  (FOLVITE) 1 MG tablet Take 1 tablet (1 mg total) by mouth daily. 30 tablet 3   gabapentin (NEURONTIN) 300 MG capsule Take 2 capsules 2 times a day. 120 capsule 3   hydrOXYzine (ATARAX/VISTARIL) 25 MG tablet Take 1 tablet (25 mg total) by mouth every 6 (six) hours as needed for itching. 90 tablet 1   lactulose (CHRONULAC) 10 GM/15ML solution Take 30 mLs (20 g total) by mouth 3 (three) times daily. Goal  to have 2-3 BMs daily 240 mL 3   levETIRAcetam (KEPPRA) 500 MG tablet Take 1 tablet (500 mg total) by mouth 2 (two) times daily. 60 tablet 3   LORazepam (ATIVAN) 1 MG tablet Take 1 tablet (1 mg total) by mouth every 8 (eight) hours as needed for anxiety. 60 tablet 1   mirtazapine (REMERON) 30 MG tablet Take 1 tablet (30 mg total) by mouth at bedtime. 30 tablet 1   Multiple Vitamin (MULTIVITAMIN WITH MINERALS) TABS tablet Take 1 tablet by mouth daily.     penicillin v potassium (VEETID) 500 MG tablet Take 500 mg by mouth daily. For tooth extraction     polyethylene glycol (MIRALAX / GLYCOLAX) 17 g packet Take 17 g by mouth 2 (two) times daily. (Patient taking differently: Take 17 g by mouth daily as needed for mild constipation or moderate constipation. ) 14 each 0   Sofosbuvir-Velpatasvir (EPCLUSA) 400-100 MG TABS Take 1 tablet by mouth daily. Take 1 tablet by mouth daily. 28 tablet 5   thiamine 100 MG tablet Take 1 tablet (100 mg total) by mouth daily. 30 tablet 0   topiramate (TOPAMAX) 50 MG tablet Take 1 tablet (50 mg total) by mouth daily. (Patient not taking: Reported on 08/13/2019) 30 tablet 2   No facility-administered medications prior to visit.     Allergies  Allergen Reactions   Pantoprazole Nausea Only and Other (See Comments)    "This made my stomach burn"    ROS Review of Systems  Constitutional: Negative.   HENT: Negative.   Eyes: Negative.   Respiratory: Negative.   Cardiovascular: Negative.   Gastrointestinal: Negative.   Endocrine: Negative.   Genitourinary:  Negative.   Musculoskeletal: Positive for arthralgias (generalized).  Skin: Negative.   Allergic/Immunologic: Negative.   Neurological: Positive for dizziness (occasioanal) and headaches (occasional ).  Hematological: Negative.   Psychiatric/Behavioral: Negative.       Objective:    Physical Exam  Constitutional: She appears well-developed and well-nourished.  HENT:  Head: Normocephalic and atraumatic.  Eyes: Conjunctivae are normal.  Neck: Normal range of motion. Neck supple.  Cardiovascular: Normal rate, regular rhythm, normal heart sounds and intact distal pulses.  Pulmonary/Chest: Effort normal and breath sounds normal.  Abdominal: Soft. Bowel sounds are normal.  Musculoskeletal: Normal range of motion.  Neurological: She is alert.  Skin: Skin is warm and dry.  Scattered bruising over extremities.   Psychiatric: She has a normal mood and affect. Her behavior is normal. Judgment and thought content normal.  Nursing note and vitals reviewed.   BP 132/88    Pulse 99    Temp 98.1 F (36.7 C)    Ht 4\' 11"  (1.499 m)    Wt 133 lb 6.4 oz (60.5 kg)    SpO2 96%    BMI 26.94 kg/m  Wt Readings from Last 3 Encounters:  08/13/19 133 lb 6.4 oz (60.5 kg)  07/08/19 130 lb 8.2 oz (59.2 kg)  05/05/19 125 lb (56.7 kg)     There are no preventive care reminders to display for this patient.  There are no preventive care reminders to display for this patient.  Lab Results  Component Value Date   TSH 1.470 05/05/2019   Lab Results  Component Value Date   WBC 2.3 (LL) 08/13/2019   HGB 13.2 08/13/2019   HCT 38.7 08/13/2019   MCV 93 08/13/2019   PLT 18 (LL) 08/13/2019   Lab Results  Component Value Date   NA 137 07/14/2019  K 4.0 07/14/2019   CO2 23 07/14/2019   GLUCOSE 96 07/14/2019   BUN 10 07/14/2019   CREATININE 0.59 07/14/2019   BILITOT 0.9 07/14/2019   ALKPHOS 140 (H) 07/14/2019   AST 41 07/14/2019   ALT 33 07/14/2019   PROT 8.1 07/14/2019   ALBUMIN 2.1 (L)  07/14/2019   CALCIUM 8.5 (L) 07/14/2019   ANIONGAP 7 07/14/2019   Lab Results  Component Value Date   CHOL 184 05/05/2019   Lab Results  Component Value Date   HDL 72 05/05/2019   Lab Results  Component Value Date   LDLCALC 89 05/05/2019   Lab Results  Component Value Date   TRIG 114 05/05/2019   Lab Results  Component Value Date   CHOLHDL 2.6 05/05/2019   Lab Results  Component Value Date   HGBA1C 4.8 08/13/2019      Assessment & Plan:   1. S/P wisdom tooth extraction Mild pain.   2. Thrombocytopenia ([place]) We will re-evaluate platelet level today Her most recent platelet count was at 50 on 07/15/2019. She has bruising has multiple bruises over her today. She continues to deny petechiae, gingival bleeding, epistaxis, profuse bleeding from superficial cuts, melena, menorrhagia, mentrorrhagia, hematochezia, and hematuria. She states that her appetite is good. Denies abdominal pain, nausea, vomiting, diarrhea, and constipation.  - CBC with Differential  3. Gums, bleeding No bleeding noted today.  - CBC with Differential  4. Hyperglycemia Blood glucose at 124 today. She will continue all medication as prescribed, to decrease foods/beverages high in sugars and carbs and follow Heart Healthy or DASH diet. Increase physical activity to at least 30 minutes cardio exercise daily.   5. Seizure ([place]) Stable. No reports of recent seizure activity reported.   6. Neuropathy  7. Alcohol abuse, daily use Improved.   8. Insomnia, unspecified type Stable. Continue meds as prescribed.   9. Anxiety Mild today.   10. Urinary tract infection with hematuria, site unspecified We will initiate Bactrim today.  - sulfamethoxazole-trimethoprim (BACTRIM DS) 800-160 MG tablet; Take 1 tablet by mouth 2 (two) times daily.  Dispense: 14 tablet; Refill: 0  11. Healthcare maintenance - POCT HgB A1C - Urinalysis Dipstick - Glucose (CBG)  12. Abnormal urinalysis Results are  pending. - Urine Culture  13. Follow up She will follow up in 3 months.   Meds ordered this encounter  Medications   sulfamethoxazole-trimethoprim (BACTRIM DS) 800-160 MG tablet    Sig: Take 1 tablet by mouth 2 (two) times daily.    Dispense:  14 tablet    Refill:  0    Orders Placed This Encounter  Procedures   Urine Culture   CBC with Differential   POCT HgB A1C   Urinalysis Dipstick   Glucose (CBG)    Referral Orders  No referral(s) requested today    Kathe Becton,  MSN, FNP-BC [address] [place], [place] [address] [phone]- fax   Problem List Items Addressed This Visit      Nervous and Auditory   Neuropathy     Genitourinary   UTI (urinary tract infection)   Relevant Medications   sulfamethoxazole-trimethoprim (BACTRIM DS) 800-160 MG tablet     Other   Insomnia   Seizure (HCC)   Thrombocytopenia (HCC)   Relevant Orders   CBC with Differential (Completed)    Other Visit Diagnoses    S/P wisdom tooth extraction    -  Primary  Gums, bleeding       Relevant Orders   CBC with Differential (Completed)   Hyperglycemia       Alcohol abuse, daily use       Anxiety       Healthcare maintenance       Relevant Orders   POCT HgB A1C (Completed)   Urinalysis Dipstick (Completed)   Glucose (CBG) (Completed)   Abnormal urinalysis       Relevant Orders   Urine Culture   Follow up          Meds ordered this encounter  Medications   sulfamethoxazole-trimethoprim (BACTRIM DS) 800-160 MG tablet    Sig: Take 1 tablet by mouth 2 (two) times daily.    Dispense:  14 tablet    Refill:  0    Follow-up: No follow-ups on file.    [name], FNP

## 2019-08-14 LAB — CBC WITH DIFFERENTIAL/PLATELET
Basophils Absolute: 0 10*3/uL (ref 0.0–0.2)
Basos: 1 %
EOS (ABSOLUTE): 0.2 10*3/uL (ref 0.0–0.4)
Eos: 7 %
Hematocrit: 38.7 % (ref 34.0–46.6)
Hemoglobin: 13.2 g/dL (ref 11.1–15.9)
Immature Grans (Abs): 0 10*3/uL (ref 0.0–0.1)
Immature Granulocytes: 0 %
Lymphocytes Absolute: 0.7 10*3/uL (ref 0.7–3.1)
Lymphs: 30 %
MCH: 31.7 pg (ref 26.6–33.0)
MCHC: 34.1 g/dL (ref 31.5–35.7)
MCV: 93 fL (ref 79–97)
Monocytes Absolute: 0.4 10*3/uL (ref 0.1–0.9)
Monocytes: 18 %
Neutrophils Absolute: 1 10*3/uL — ABNORMAL LOW (ref 1.4–7.0)
Neutrophils: 44 %
Platelets: 18 10*3/uL — CL (ref 150–450)
RBC: 4.17 x10E6/uL (ref 3.77–5.28)
RDW: 16.5 % — ABNORMAL HIGH (ref 11.7–15.4)
WBC: 2.3 10*3/uL — CL (ref 3.4–10.8)

## 2019-08-15 ENCOUNTER — Other Ambulatory Visit: Payer: Self-pay | Admitting: Pharmacist

## 2019-08-15 DIAGNOSIS — B1710 Acute hepatitis C without hepatic coma: Secondary | ICD-10-CM

## 2019-08-15 LAB — URINE CULTURE

## 2019-08-15 NOTE — Progress Notes (Signed)
hep

## 2019-08-16 ENCOUNTER — Telehealth: Payer: Self-pay | Admitting: Family Medicine

## 2019-08-16 ENCOUNTER — Emergency Department (HOSPITAL_COMMUNITY)
Admission: EM | Admit: 2019-08-16 | Discharge: 2019-08-16 | Disposition: A | Discharge status: Home / Self Care | Payer: Medicaid Other | Attending: Emergency Medicine | Admitting: Emergency Medicine

## 2019-08-16 ENCOUNTER — Ambulatory Visit: Payer: Medicaid Other | Admitting: Pharmacist

## 2019-08-16 ENCOUNTER — Encounter (HOSPITAL_COMMUNITY): Payer: Medicaid Other

## 2019-08-16 ENCOUNTER — Other Ambulatory Visit: Payer: Self-pay

## 2019-08-16 ENCOUNTER — Encounter (HOSPITAL_COMMUNITY): Payer: Self-pay

## 2019-08-16 ENCOUNTER — Other Ambulatory Visit: Payer: Self-pay | Admitting: Family Medicine

## 2019-08-16 DIAGNOSIS — D696 Thrombocytopenia, unspecified: Secondary | ICD-10-CM | POA: Insufficient documentation

## 2019-08-16 DIAGNOSIS — Z79899 Other long term (current) drug therapy: Secondary | ICD-10-CM | POA: Diagnosis not present

## 2019-08-16 DIAGNOSIS — R079 Chest pain, unspecified: Secondary | ICD-10-CM | POA: Diagnosis not present

## 2019-08-16 DIAGNOSIS — R7989 Other specified abnormal findings of blood chemistry: Secondary | ICD-10-CM | POA: Diagnosis present

## 2019-08-16 LAB — TYPE AND SCREEN
ABO/RH(D): O POS
Antibody Screen: NEGATIVE

## 2019-08-16 LAB — CBC WITH DIFFERENTIAL/PLATELET
Abs Immature Granulocytes: 0 10*3/uL (ref 0.00–0.07)
Basophils Absolute: 0 10*3/uL (ref 0.0–0.1)
Basophils Relative: 1 %
Eosinophils Absolute: 0.1 10*3/uL (ref 0.0–0.5)
Eosinophils Relative: 2 %
HCT: 39.7 % (ref 36.0–46.0)
Hemoglobin: 12.8 g/dL (ref 12.0–15.0)
Immature Granulocytes: 0 %
Lymphocytes Relative: 31 %
Lymphs Abs: 0.7 10*3/uL (ref 0.7–4.0)
MCH: 31.3 pg (ref 26.0–34.0)
MCHC: 32.2 g/dL (ref 30.0–36.0)
MCV: 97.1 fL (ref 80.0–100.0)
Monocytes Absolute: 0.4 10*3/uL (ref 0.1–1.0)
Monocytes Relative: 18 %
Neutro Abs: 1.1 10*3/uL — ABNORMAL LOW (ref 1.7–7.7)
Neutrophils Relative %: 48 %
Platelets: 30 10*3/uL — ABNORMAL LOW (ref 150–400)
RBC: 4.09 MIL/uL (ref 3.87–5.11)
RDW: 17.9 % — ABNORMAL HIGH (ref 11.5–15.5)
WBC: 2.3 10*3/uL — ABNORMAL LOW (ref 4.0–10.5)
nRBC: 0 % (ref 0.0–0.2)

## 2019-08-16 LAB — COMPREHENSIVE METABOLIC PANEL
ALT: 30 U/L (ref 0–44)
AST: 74 U/L — ABNORMAL HIGH (ref 15–41)
Albumin: 3.2 g/dL — ABNORMAL LOW (ref 3.5–5.0)
Alkaline Phosphatase: 162 U/L — ABNORMAL HIGH (ref 38–126)
Anion gap: 12 (ref 5–15)
BUN: 5 mg/dL — ABNORMAL LOW (ref 6–20)
CO2: 23 mmol/L (ref 22–32)
Calcium: 8.4 mg/dL — ABNORMAL LOW (ref 8.9–10.3)
Chloride: 103 mmol/L (ref 98–111)
Creatinine, Ser: 0.49 mg/dL (ref 0.44–1.00)
GFR calc Af Amer: >60 mL/min (ref >60)
GFR calc non Af Amer: >60 mL/min (ref >60)
Glucose, Bld: 89 mg/dL (ref 70–99)
Potassium: 6 mmol/L — ABNORMAL HIGH (ref 3.5–5.1)
Sodium: 138 mmol/L (ref 135–145)
Total Bilirubin: 0.5 mg/dL (ref 0.3–1.2)
Total Protein: 7.8 g/dL (ref 6.5–8.1)

## 2019-08-16 LAB — PROTIME-INR
INR: 1 (ref 0.8–1.2)
Prothrombin Time: 13.5 seconds (ref 11.4–15.2)

## 2019-08-16 MED ORDER — VITAMIN B-1 100 MG PO TABS
100.0000 mg | ORAL_TABLET | Freq: Every day | ORAL | Status: DC
  Administered 2019-08-16: 100 mg via ORAL
  Filled 2019-08-16: qty 1

## 2019-08-16 MED ORDER — LORAZEPAM 2 MG/ML IJ SOLN: 0.0000 mg | Freq: Four times a day (QID) | INTRAMUSCULAR | Status: DC

## 2019-08-16 MED ORDER — LORAZEPAM 2 MG/ML IJ SOLN: 0.0000 mg | Freq: Two times a day (BID) | INTRAMUSCULAR | Status: DC

## 2019-08-16 MED ORDER — LORAZEPAM 1 MG PO TABS
0.0000 mg | ORAL_TABLET | Freq: Four times a day (QID) | ORAL | Status: DC
  Administered 2019-08-16: 1 mg via ORAL
  Filled 2019-08-16: qty 1

## 2019-08-16 MED ORDER — THIAMINE HCL 100 MG/ML IJ SOLN
100.0000 mg | Freq: Every day | INTRAMUSCULAR | Status: DC
  Filled 2019-08-16: qty 2

## 2019-08-16 MED ORDER — LORAZEPAM 1 MG PO TABS: 0.0000 mg | ORAL_TABLET | Freq: Two times a day (BID) | ORAL | Status: DC

## 2019-08-16 MED FILL — FLUoxetine HCL 40 MG CAPS: 40 | 30 days supply | Qty: 30 | Fill #1

## 2019-08-16 MED FILL — ARIPIPRAZOLE 5 MG TABS: 5 | 30 days supply | Qty: 30 | Fill #1

## 2019-08-16 MED FILL — MIRTAZAPINE 30 MG TABLET: 30 | 30 days supply | Qty: 30 | Fill #1

## 2019-08-16 MED FILL — SOFOSBUVIR-VELPATASVIR 400-: 400-100 | 28 days supply | Qty: 28 | Fill #1

## 2019-08-16 NOTE — ED Provider Notes (Signed)
[facility] DEPT Provider Note   CSN: [id number] Arrival date & time: 08/16/19  1120     History   Chief Complaint Chief Complaint  Patient presents with  . Abnormal Lab  . Dizziness    HPI Briana Sweeney is a 48 y.o. female.     48 y.o female with a PMH of GERD, Migraines presents to the ED with a chief complaint of abnormal labs. Patient was seen by her PCP 3 days ago, she had blood drawn and was called today with the results stating that her platelets were 18.  Patient did have her wisdom teeth removed about a week ago, she complains of bleeding to her gums daily, states tasting a foul taste and odor in her mouth.  She also reports increased fatigue, she also states some blurred vision along with shortness of breath.  States shortness of breath occurs when she is mainly walking.  She also reports some left-sided chest pain, this is exacerbated with her ambulation.  She reports a similar episode in the past and where her platelets were 0, she was treated at [facility] and transfused.  She states that thrombocytopenia was due to her cirrhosis of the liver, she does continue drinking states she is cut down to 6 beers daily.  Denies any blood in her stool, hematemesis. No fevers, or sick contacts.   The history is provided by the patient and medical records.  Abnormal Lab Dizziness Associated symptoms: chest pain and shortness of breath   Associated symptoms: no nausea, no palpitations and no vomiting     Past Medical History:  Diagnosis Date  . Anxiety   . Bipolar affective disorder ([place])    With anxiety features  . Cirrhosis of liver ([place])    Due to alcohol and hepatitis C  . Depression   . 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 ETOHism ([place])   . GERD (gastroesophageal reflux disease)   . Hematemesis 02/10/2018  . Hepatitis C 2018   hepatitis c and alcohol related hepatitis  . Heroin abuse ([place])   . History of blood transfusion    "blood  doesn't clot; I fell down and had to have a transfusion"  . History of kidney stones   . Menopause 2016  . Migraine    "when I get really stressed" (09/01/2017)  . Schizophrenia ([place])   . Seizures ([place])    "when I run out of my RX; lots recently" (09/01/2017)    Patient Active Problem List   Diagnosis Date Noted  . Insomnia 05/05/2019  . Dizziness 04/02/2019  . Bruising 04/02/2019  . Hyponatremia 04/02/2019  . Head injury   . Scalp laceration, initial encounter   . Fall 01/20/2019  . Epistaxis 01/20/2019  . Altered mental status   . 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 Other mixed anxiety disorders 10/27/2018  . Neuropathy 10/27/2018  . Abscess of bursa of left elbow 09/23/2018  . Olecranon bursitis of left elbow   . Erysipelas 09/13/2018  . Acute metabolic encephalopathy 0000000  . Overdose 08/22/2018  . Hypotension 08/22/2018  . Seizure ([place]) 08/22/2018  . Substance induced mood disorder ([place]) 07/17/2018  . Cocaine dependence without complication ([place]) [id number]  . Polysubstance abuse ([place]) 04/08/2018  . Encephalopathy, 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[place]) 04/08/2018  . Hepatitis C 03/18/2018  . Portal hypertension ([place]) 03/18/2018  . Alcoholic intoxication without complication ([place]) [id number]  . Depression 03/18/2018  . Upper GI bleed 02/10/2018  . Alcohol use disorder, severe,  in early remission ([place]) 02/10/2018  . Chronic anemia   . Thrombocytopenia due to drugs   . Suicidal ideation   . Thrombocytopenia ([place]) 01/02/2018  . GERD (gastroesophageal reflux disease) 11/01/2017  . Trichimoniasis 10/30/2017  . Alcohol dependence ([place]) 10/29/2017  . MDD (major depressive disorder), severe ([place]) 10/28/2017  . Acute hyperactive alcohol withdrawal delirium ([place]) 10/09/2017  . Schizophrenia ([place]) 10/09/2017  . Alcohol abuse with alcohol-induced mood disorder ([place])   . Alcohol withdrawal syndrome with complication ([place]) [id number]  . Esophageal varices without bleeding  ([place])   . Hematemesis 09/01/2017  . Ascites due to alcoholic cirrhosis ([place])   . Decompensated hepatic cirrhosis ([place]) 07/23/2017  . Alcohol abuse 07/23/2017  . Hepatitis C antibody positive in blood 07/23/2017  . Hypokalemia 07/23/2017  . Jaundice 07/23/2017  . Coagulopathy ([place]) 07/23/2017  . Hypomagnesemia 07/23/2017  . Pancytopenia ([place]) 07/23/2017  . Alcoholic cirrhosis of liver with ascites ([place]) 07/23/2017  . Bacterial vaginosis 06/03/2017  . UTI (urinary tract infection) 06/02/2017    Past Surgical History:  Procedure Laterality Date  . ESOPHAGOGASTRODUODENOSCOPY N/A 09/03/2017   Procedure: ESOPHAGOGASTRODUODENOSCOPY (EGD);  Surgeon: Doran Stabler, MD;  Location: [facility];  Service: Gastroenterology;  Laterality: N/A;  . FINGER FRACTURE SURGERY Left    "shattered my pinky"  . FRACTURE SURGERY    . I&D EXTREMITY Left 09/18/2018   Procedure: IRRIGATION AND DEBRIDEMENT EXTREMITY;  Surgeon: Leanora Cover, MD;  Location: [facility];  Service: Orthopedics;  Laterality: Left;  . IR PARACENTESIS  07/23/2017  . IR PARACENTESIS  07/2017   "did it twice in the same week" (09/01/2017)  . SHOULDER OPEN ROTATOR CUFF REPAIR Right   . TOOTH EXTRACTION  08/2019  . TUBAL LIGATION    . VAGINAL HYSTERECTOMY       OB History    Gravida  3   Para      Term      Preterm      AB      Living  2     SAB      TAB      Ectopic      Multiple      Live Births               Home Medications    Prior to Admission medications   Medication Sig Start Date End Date Taking? Authorizing Provider  alum & mag hydroxide-simeth (MAALOX/MYLANTA) 200-200-20 MG/5ML suspension Take 15 mLs by mouth every 4 (four) hours as needed for indigestion or heartburn. 04/13/19  Yes Hosie Poisson, MD  amLODipine (NORVASC) 5 MG tablet Take 1 tablet (5 mg total) by mouth daily. 07/15/19  Yes Dessa Phi, DO  ARIPiprazole (ABILIFY) 5 MG tablet Take 1 tablet (5 mg total) by mouth daily. 07/19/19  09/17/19 Yes Pucilowski, Olgierd A, MD  busPIRone (BUSPAR) 10 MG tablet Take 1 tablet (10 mg total) by mouth 2 (two) times daily. 07/16/19  Yes [name], FNP  FLUoxetine (PROZAC) 40 MG capsule Take 1 capsule (40 mg total) by mouth daily. 07/19/19  Yes Pucilowski, Olgierd A, MD  gabapentin (NEURONTIN) 300 MG capsule Take 2 capsules 2 times a day. Patient taking differently: Take 600 mg by mouth 2 (two) times daily.  08/11/19  Yes [name], FNP  hydrOXYzine (ATARAX/VISTARIL) 25 MG tablet Take 1 tablet (25 mg total) by mouth every 6 (six) hours as needed for itching. 06/08/19  Yes [name], FNP  lactulose [facility]) 10 GM/15ML solution Take  30 mLs (20 g total) by mouth 3 (three) times daily. Goal to have 2-3 BMs daily Patient taking differently: Take 20 g by mouth 3 (three) times daily as needed for moderate constipation. Goal to have 2-3 BMs daily 04/13/19  Yes Hosie Poisson, MD  levETIRAcetam (KEPPRA) 500 MG tablet Take 1 tablet (500 mg total) by mouth 2 (two) times daily. 06/15/19  Yes [name], FNP  LORazepam (ATIVAN) 1 MG tablet Take 1 tablet (1 mg total) by mouth every 8 (eight) hours as needed for anxiety. 07/19/19 09/17/19 Yes Pucilowski, Olgierd A, MD  mirtazapine (REMERON) 30 MG tablet Take 1 tablet (30 mg total) by mouth at bedtime. 07/19/19 09/17/19 Yes Pucilowski, Olgierd A, MD  polyethylene glycol (MIRALAX / GLYCOLAX) 17 g packet Take 17 g by mouth 2 (two) times daily. Patient taking differently: Take 17 g by mouth daily as needed for mild constipation.  04/13/19  Yes Hosie Poisson, MD  Sofosbuvir-Velpatasvir (EPCLUSA) 400-100 MG TABS Take 1 tablet by mouth daily. Take 1 tablet by mouth daily. 07/12/19  Yes [place], FNP  sulfamethoxazole-trimethoprim (BACTRIM DS) 800-160 MG tablet Take 1 tablet by mouth 2 (two) times daily. 08/13/19  Yes [name], FNP  folic acid (FOLVITE) 1 MG tablet Take 1 tablet (1 mg total) by mouth daily. Patient not taking:  Reported on 08/16/2019 04/13/19   Hosie Poisson, MD  Multiple Vitamin (MULTIVITAMIN WITH MINERALS) TABS tablet Take 1 tablet by mouth daily. Patient not taking: Reported on 08/16/2019 04/13/19   Hosie Poisson, MD  thiamine 100 MG tablet Take 1 tablet (100 mg total) by mouth daily. Patient not taking: Reported on 08/16/2019 04/14/19   Hosie Poisson, MD  topiramate (TOPAMAX) 50 MG tablet Take 1 tablet (50 mg total) by mouth daily. Patient not taking: Reported on 08/13/2019 04/13/19   Hosie Poisson, MD    Family History Family History  Problem Relation Age of Onset  . Lung cancer Mother 41  . Alcohol abuse Mother   . Throat cancer Father 46    Social History Social History   Tobacco Use  . Smoking status: Never Smoker  . Smokeless tobacco: Never Used  Substance Use Topics  . Alcohol use: Yes    Alcohol/week: 63.0 standard drinks    Types: 63 Cans of beer per week    Comment: weekly "I have cut back"; 4 days 4 vs 12 pack   . Drug use: Yes    Types: Marijuana    Comment: cocaiane not currently     Allergies   Patient has no known allergies.   Review of Systems Review of Systems  Constitutional: Negative for fever.  HENT: Positive for dental problem. Negative for sinus pressure.   Respiratory: Positive for shortness of breath.   Cardiovascular: Positive for chest pain. Negative for palpitations.  Gastrointestinal: Negative for abdominal pain, nausea and vomiting.  Genitourinary: Negative for flank pain.  Musculoskeletal: Negative for back pain.  Skin: Negative for pallor and wound.  Neurological: Positive for dizziness.     Physical Exam Updated Vital Signs BP 98/73   Pulse 86   Temp 98 F (36.7 C) (Oral)   Resp 18   Ht 4\' 11"  (1.499 m)   Wt 60.5 kg   SpO2 94%   BMI 26.94 kg/m   Physical Exam Vitals signs and nursing note reviewed.  Constitutional:      General: She is not in acute distress.    Appearance: She is well-developed. She is ill-appearing.  HENT:      Head: Normocephalic and atraumatic.     Mouth/Throat:     Pharynx: No oropharyngeal exudate.     Comments: No gross blood in oropharynx.  Eyes:     Pupils: Pupils are equal, round, and reactive to light.  Neck:     Musculoskeletal: Normal range of motion.  Cardiovascular:     Rate and Rhythm: Regular rhythm.     Heart sounds: Normal heart sounds.  Pulmonary:     Effort: Pulmonary effort is normal. No respiratory distress.     Breath sounds: Normal breath sounds.  Chest:     Chest wall: No tenderness.  Abdominal:     General: Bowel sounds are normal. There is no distension.     Palpations: Abdomen is soft.     Tenderness: There is no abdominal tenderness.  Musculoskeletal:        General: No tenderness or deformity.     Right lower leg: No edema.     Left lower leg: No edema.  Skin:    General: Skin is warm and dry.  Neurological:     Mental Status: She is alert and oriented to person, place, and time.      ED Treatments / Results  Labs (all labs ordered are listed, but only abnormal results are displayed) Labs Reviewed  CBC WITH DIFFERENTIAL/PLATELET - Abnormal; Notable for the following components:      Result Value   WBC 2.3 (*)    RDW 17.9 (*)    Platelets 30 (*)    Neutro Abs 1.1 (*)    All other components within normal limits  COMPREHENSIVE METABOLIC PANEL - Abnormal; Notable for the following components:   Potassium 6.0 (*)    BUN 5 (*)    Calcium 8.4 (*)    Albumin 3.2 (*)    AST 74 (*)    Alkaline Phosphatase 162 (*)    All other components within normal limits  PROTIME-INR  TYPE AND SCREEN    EKG None  Radiology No results found.  Procedures Procedures (including critical care time)  Medications Ordered in ED Medications  LORazepam (ATIVAN) injection 0-4 mg ( Intravenous See Alternative 08/16/19 1456)    Or  LORazepam (ATIVAN) tablet 0-4 mg (1 mg Oral Given 08/16/19 1456)  LORazepam (ATIVAN) injection 0-4 mg (has no administration in time  range)    Or  LORazepam (ATIVAN) tablet 0-4 mg (has no administration in time range)  thiamine (VITAMIN B-1) tablet 100 mg (100 mg Oral Given 08/16/19 1456)    Or  thiamine (B-1) injection 100 mg ( Intravenous See Alternative 08/16/19 1456)     Initial Impression / Assessment and Plan / ED Course  I have reviewed the triage vital signs and the nursing notes.  Pertinent labs & imaging results that were available during my care of the patient were reviewed by me and considered in my medical decision making (see chart for details).    Patient with a PMH of Alcoholic cirrhosis presents to the ED with a chief complaint of abnormal labs.  According to patient she was seen by PCP 4 days ago, had her blood work drawn which showed her platelets were 18.  She did have a procedure which consisted of removal of her wisdom teeth about a week ago.  Reports she has been bleeding from the site significantly however bleeding has now stopped after using peroxide.  She also endorses some shortness of breath, chest pain with ambulation.  Seems to be chronic per patient she does have a previous history of cirrhosis of the liver.  She does report she is continuing drinking, states she has cut down to 6 beers daily.  Is otherwise hemodynamically stable, blood pressure slightly soft however there is no tachycardia or hypoxia.  CMP remarkable for some mild hyperkalemia, no EKG changes.  AST is elevated, she does continue to consume alcohol.  CBC with some neutropenia, hemoglobin is within normal limits.  Type and screen was obtained.  PT and INR within normal limits.  She was provided with Ativan to help with her alcohol withdrawal, last drink was yesterday.  She does report she feels like her symptoms are likely occurring from her wisdom teeth removal, she feels otherwise stable to follow-up with outpatient visit.  According to her lab work which I extensively reviewed, she has no previous history of thrombocytopenia due  to alcohol cirrhosis, she recently has levels between 18, 20, 30.  No visual gross blood on my exam.   I have disused patient with Dr. Wilson Singer who agrees with plan and management.  Portions of this note were generated with Lobbyist. Dictation errors may occur despite best attempts at proofreading.  Final Clinical Impressions(s) / ED Diagnoses   Final diagnoses:  Thrombocytopenia [facility])    ED Discharge Orders    None       Janeece Fitting, Hershal Coria 08/16/19 1522    Virgel Manifold, MD 08/17/19 [phone]

## 2019-08-16 NOTE — ED Triage Notes (Signed)
Patient states she was called today and was told her platelets were 18.0. Patient was told to come to the ED for platelet transfusion.  Patient states she had dental surgery a week ago and had a lot of bleeding and patient states she has cirrhosis and continues to drink.

## 2019-08-16 NOTE — Telephone Encounter (Signed)
Patient informed that her platelet count has decreased to 18. We previously scheduled her to [facility]. Patient states that she will report to ED @ [facility] instead today to receive platelet infusion. Patient is stable, but reports mild gum bleeding from previous tooth extractions last week, dizziness, and fatigue. Denies easy bruising, petechiae, epistaxis, profuse bleeding from superficial cuts, menorrhagia, mentrorrhagia, hematochezia, and hematuria.    Kathe Becton,  MSN, FNP-BC [facility] [address] [employer], [place] [address] [phone] fax

## 2019-08-16 NOTE — Discharge Instructions (Addendum)
Your platelets were 30 today, improved from 3 days ago.  There was no active bleeding on your exam. We feel that outpatient follow up is appropriate. If you have worsening symptoms please return to the ED.

## 2019-08-17 ENCOUNTER — Other Ambulatory Visit: Payer: Self-pay | Admitting: Family Medicine

## 2019-08-17 MED FILL — hydrOXYzine HCL 25 MG TABS: 25 | 22 days supply | Qty: 90 | Fill #0

## 2019-08-17 MED FILL — LORazepam 1 MG TABS: 1 | 20 days supply | Qty: 60 | Fill #1

## 2019-08-20 ENCOUNTER — Other Ambulatory Visit: Payer: Self-pay

## 2019-08-20 ENCOUNTER — Ambulatory Visit
Admission: RE | Admit: 2019-08-20 | Discharge: 2019-08-20 | Disposition: A | Discharge status: Home / Self Care | Payer: Medicaid Other | Source: Ambulatory Visit | Attending: Family Medicine | Admitting: Family Medicine

## 2019-08-20 DIAGNOSIS — N644 Mastodynia: Secondary | ICD-10-CM

## 2019-08-23 ENCOUNTER — Ambulatory Visit: Payer: Self-pay | Admitting: Neurology

## 2019-08-24 ENCOUNTER — Telehealth (HOSPITAL_COMMUNITY): Payer: Self-pay

## 2019-08-24 NOTE — Telephone Encounter (Signed)
Error

## 2019-08-26 ENCOUNTER — Other Ambulatory Visit: Payer: Self-pay

## 2019-08-26 MED ORDER — AMLODIPINE BESYLATE 5 MG PO TABS: 5.0000 mg | ORAL_TABLET | Freq: Every day | ORAL | 3 refills | Status: DC

## 2019-08-26 MED FILL — AMLODIPINE BESYLATE 5 MG TA: 5 | 30 days supply | Qty: 30 | Fill #0

## 2019-08-31 ENCOUNTER — Telehealth: Payer: Self-pay | Admitting: Family Medicine

## 2019-09-01 ENCOUNTER — Other Ambulatory Visit: Payer: Medicaid Other

## 2019-09-01 ENCOUNTER — Other Ambulatory Visit: Payer: Self-pay | Admitting: Family

## 2019-09-01 ENCOUNTER — Other Ambulatory Visit: Payer: Self-pay

## 2019-09-01 DIAGNOSIS — B1710 Acute hepatitis C without hepatic coma: Secondary | ICD-10-CM

## 2019-09-01 DIAGNOSIS — B182 Chronic viral hepatitis C: Secondary | ICD-10-CM

## 2019-09-01 NOTE — Telephone Encounter (Signed)
Contacted pt to go over MM results pt didn't have any questions or concerns regarding results  Pt needed [facility] info because she missed her appointment. Provided pt with the phone number to call and reschedule

## 2019-09-06 LAB — HEPATITIS C RNA QUANTITATIVE
HCV Quantitative Log: <1.18 Log IU/mL
HCV RNA, PCR, QN: <15 IU/mL

## 2019-09-06 MED FILL — hydrOXYzine HCL 25 MG TABS: 25 | 22 days supply | Qty: 90 | Fill #0

## 2019-09-07 ENCOUNTER — Encounter: Payer: Self-pay | Admitting: Family Medicine

## 2019-09-07 ENCOUNTER — Ambulatory Visit (INDEPENDENT_AMBULATORY_CARE_PROVIDER_SITE_OTHER): Payer: Medicaid Other | Admitting: Family Medicine

## 2019-09-07 ENCOUNTER — Other Ambulatory Visit: Payer: Self-pay

## 2019-09-07 VITALS — BP 122/89 | HR 88 | Temp 97.7°F | Ht 59.0 in | Wt 137.0 lb

## 2019-09-07 DIAGNOSIS — D696 Thrombocytopenia, unspecified: Secondary | ICD-10-CM | POA: Diagnosis not present

## 2019-09-07 DIAGNOSIS — F419 Anxiety disorder, unspecified: Secondary | ICD-10-CM | POA: Diagnosis not present

## 2019-09-07 DIAGNOSIS — G4700 Insomnia, unspecified: Secondary | ICD-10-CM | POA: Diagnosis not present

## 2019-09-07 DIAGNOSIS — Z0000 Encounter for general adult medical examination without abnormal findings: Secondary | ICD-10-CM | POA: Diagnosis not present

## 2019-09-07 DIAGNOSIS — Z09 Encounter for follow-up examination after completed treatment for conditions other than malignant neoplasm: Secondary | ICD-10-CM

## 2019-09-07 DIAGNOSIS — F1011 Alcohol abuse, in remission: Secondary | ICD-10-CM | POA: Insufficient documentation

## 2019-09-07 LAB — POCT URINALYSIS DIPSTICK
Blood, UA: NEGATIVE
Glucose, UA: NEGATIVE
Ketones, UA: NEGATIVE
Leukocytes, UA: NEGATIVE
Nitrite, UA: NEGATIVE
Protein, UA: NEGATIVE
Spec Grav, UA: 1.025 (ref 1.010–1.025)
Urobilinogen, UA: 0.2 E.U./dL
pH, UA: 5.5 (ref 5.0–8.0)

## 2019-09-07 NOTE — Progress Notes (Signed)
Patient [facility]   Established Patient Office Visit  Subjective:  Patient ID: Shianne Bandy, female    DOB: 1971/07/16  Age: 48 y.o. MRN: [id number]  CC:  Chief Complaint  Patient presents with  . Follow-up  . Hospitalization Follow-up    Discharged 08/16/2019  Dx: Thrombocytopenia     HPI Keyoshia Darby Angelle is a 48 year old female who presents for Follow Up today.  Past Medical History:  Diagnosis Date  . Anxiety   . Bipolar affective disorder ([place])    With anxiety features  . 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 Depression   . 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 ETOHism ([place])   . GERD (gastroesophageal reflux disease)   . Hematemesis 02/10/2018  . Hepatitis C 2018   hepatitis c and alcohol related hepatitis  . Heroin abuse ([place])   . History of blood transfusion    "blood doesn't clot; I fell down and had to have a transfusion"  . History of kidney stones   . Menopause 2016  . Migraine    "when I get really stressed" (09/01/2017)  . Schizophrenia ([place])   . Seizures ([place])    "when I run out of my RX; lots recently" (09/01/2017)   Current Status: Since her last office visit, she had ED visit on 08/16/2019 for Thrombocytopenia. She did not receive blood transfusion at that time because her platelet count increased. She denies easy bruising, petechiae, gingival bleeding, epistaxis, profuse bleeding from superficial cuts, menorrhagia, mentrorrhagia, hematochezia, and hematuria. She is scheduled for [place] surgery on 09/29/2018. Otherwise, she is doing well.  She denies fevers, chills, fatigue, recent infections, weight loss, and night sweats. She has not had any headaches, visual changes, dizziness, and falls. No chest pain, heart palpitations, cough and shortness of breath reported. No reports of GI problems such as nausea, vomiting, diarrhea, and constipation. She has no reports of blood in stools,  and dysuria. No depression or anxiety, and denies suicidal ideations, homicidal ideations, or auditory hallucinations. She denies pain today.   Past Surgical History:  Procedure Laterality Date  . ESOPHAGOGASTRODUODENOSCOPY N/A 09/03/2017   Procedure: ESOPHAGOGASTRODUODENOSCOPY (EGD);  Surgeon: Doran Stabler, MD;  Location: [facility];  Service: Gastroenterology;  Laterality: N/A;  . FINGER FRACTURE SURGERY Left    "shattered my pinky"  . FRACTURE SURGERY    . I & D EXTREMITY Left 09/18/2018   Procedure: IRRIGATION AND DEBRIDEMENT EXTREMITY;  Surgeon: Leanora Cover, MD;  Location: [facility];  Service: Orthopedics;  Laterality: Left;  . IR PARACENTESIS  07/23/2017  . IR PARACENTESIS  07/2017   "did it twice in the same week" (09/01/2017)  . SHOULDER OPEN ROTATOR CUFF REPAIR Right   . TOOTH EXTRACTION  08/2019  . TUBAL LIGATION    . VAGINAL HYSTERECTOMY      Family History  Problem Relation Age of Onset  . Lung cancer Mother 64  . Alcohol abuse Mother   . Throat cancer Father 67    Social History   Socioeconomic History  . Marital status: Divorced    Spouse name: Not on file  . Number of children: 2  . Years of education: Not on file  . Highest education level: Not on file  Occupational History  . Occupation: applying for disability  Tobacco Use  . Smoking status: Never Smoker  . Smokeless tobacco: Never Used  Substance and  Sexual Activity  . Alcohol use: Yes    Alcohol/week: 63.0 standard drinks    Types: 63 Cans of beer per week    Comment: weekly "I have cut back"; 4 days 4 vs 12 pack   . Drug use: Yes    Types: Marijuana    Comment: cocaiane not currently  . Sexual activity: Yes  Other Topics Concern  . Not on file  Social History Narrative   She moved with a boyfriend to [place] and was followed at [facility].  He died of a massive heart attack in 8/18, per her report, and so she moved back to [place] and is living with a friend.   Social  Determinants of Health   Financial Resource Strain:   . Difficulty of Paying Living Expenses: Not on file  Food Insecurity:   . Worried About Charity fundraiser in the Last Year: Not on file  . Ran Out of Food in the Last Year: Not on file  Transportation Needs:   . Lack of Transportation (Medical): Not on file  . Lack of Transportation (Non-Medical): Not on file  Physical Activity:   . Days of Exercise per Week: Not on file  . Minutes of Exercise per Session: Not on file  Stress:   . Feeling of Stress : Not on file  Social Connections:   . Frequency of Communication with Friends and Family: Not on file  . Frequency of Social Gatherings with Friends and Family: Not on file  . Attends Religious Services: Not on file  . Active Member of Clubs or Organizations: Not on file  . Attends Archivist Meetings: Not on file  . Marital Status: Not on file  Intimate Partner Violence:   . Fear of Current or Ex-Partner: Not on file  . Emotionally Abused: Not on file  . Physically Abused: Not on file  . Sexually Abused: Not on file    Outpatient Medications Prior to Visit  Medication Sig Dispense Refill  . alum & mag hydroxide-simeth (MAALOX/MYLANTA) 200-200-20 MG/5ML suspension Take 15 mLs by mouth every 4 (four) hours as needed for indigestion or heartburn. 355 mL 0  . amLODipine (NORVASC) 5 MG tablet Take 1 tablet (5 mg total) by mouth daily. 30 tablet 3  . ARIPiprazole (ABILIFY) 5 MG tablet Take 1 tablet (5 mg total) by mouth daily. 30 tablet 1  . busPIRone (BUSPAR) 10 MG tablet Take 1 tablet (10 mg total) by mouth 2 (two) times daily. 30 tablet 3  . FLUoxetine (PROZAC) 40 MG capsule Take 1 capsule (40 mg total) by mouth daily. 30 capsule 3  . folic acid (FOLVITE) 1 MG tablet Take 1 tablet (1 mg total) by mouth daily. (Patient not taking: Reported on 08/16/2019) 30 tablet 3  . gabapentin (NEURONTIN) 300 MG capsule Take 2 capsules 2 times a day. (Patient taking differently: Take 600  mg by mouth 2 (two) times daily. ) 120 capsule 3  . hydrOXYzine (ATARAX/VISTARIL) 25 MG tablet TAKE 1 TABLET (25 MG TOTAL) BY MOUTH EVERY 6 (SIX) HOURS AS NEEDED FOR ITCHING. 90 tablet 1  . lactulose (CHRONULAC) 10 GM/15ML solution Take 30 mLs (20 g total) by mouth 3 (three) times daily. Goal to have 2-3 BMs daily (Patient taking differently: Take 20 g by mouth 3 (three) times daily as needed for moderate constipation. Goal to have 2-3 BMs daily) 240 mL 3  . levETIRAcetam (KEPPRA) 500 MG tablet Take 1 tablet (500 mg total) by mouth  2 (two) times daily. 60 tablet 3  . LORazepam (ATIVAN) 1 MG tablet Take 1 tablet (1 mg total) by mouth every 8 (eight) hours as needed for anxiety. 60 tablet 1  . mirtazapine (REMERON) 30 MG tablet Take 1 tablet (30 mg total) by mouth at bedtime. 30 tablet 1  . Multiple Vitamin (MULTIVITAMIN WITH MINERALS) TABS tablet Take 1 tablet by mouth daily. (Patient not taking: Reported on 08/16/2019)    . polyethylene glycol (MIRALAX / GLYCOLAX) 17 g packet Take 17 g by mouth 2 (two) times daily. (Patient taking differently: Take 17 g by mouth daily as needed for mild constipation. ) 14 each 0  . Sofosbuvir-Velpatasvir (EPCLUSA) 400-100 MG TABS Take 1 tablet by mouth daily. Take 1 tablet by mouth daily. 28 tablet 5  . sulfamethoxazole-trimethoprim (BACTRIM DS) 800-160 MG tablet Take 1 tablet by mouth 2 (two) times daily. 14 tablet 0  . thiamine 100 MG tablet Take 1 tablet (100 mg total) by mouth daily. (Patient not taking: Reported on 08/16/2019) 30 tablet 0  . topiramate (TOPAMAX) 50 MG tablet Take 1 tablet (50 mg total) by mouth daily. (Patient not taking: Reported on 08/13/2019) 30 tablet 2   No facility-administered medications prior to visit.    No Known Allergies  ROS Review of Systems  Constitutional: Negative.   HENT: Negative.   Eyes: Negative.   Respiratory: Negative.   Cardiovascular: Negative.   Gastrointestinal: Positive for abdominal distention.  Endocrine:  Negative.   Genitourinary: Negative.   Musculoskeletal: Positive for arthralgias (generalized.).  Skin: Negative.        Generalized bruising.   Allergic/Immunologic: Negative.   Neurological: Positive for dizziness (occasional) and headaches (occasional ).  Hematological: Negative.   Psychiatric/Behavioral: Negative.    Objective:    Physical Exam  Constitutional: She is oriented to person, place, and time. She appears well-developed and well-nourished.  HENT:  Head: Normocephalic and atraumatic.  Eyes: Conjunctivae are normal.  Cardiovascular: Normal rate, regular rhythm, normal heart sounds and intact distal pulses.  Pulmonary/Chest: Effort normal and breath sounds normal.  Abdominal: Soft. Bowel sounds are normal.  Musculoskeletal:        General: Normal range of motion.     Cervical back: Normal range of motion and neck supple.  Neurological: She is alert and oriented to person, place, and time. She has normal reflexes.  Skin: Skin is warm and dry.  Psychiatric: She has a normal mood and affect. Her behavior is normal. Judgment and thought content normal.  Nursing note and vitals reviewed.   BP 122/89   Pulse 88   Temp 97.7 F (36.5 C) (Oral)   Ht 4\' 11"  (1.499 m)   Wt 137 lb (62.1 kg)   SpO2 98%   BMI 27.67 kg/m  Wt Readings from Last 3 Encounters:  09/07/19 137 lb (62.1 kg)  08/16/19 133 lb 6.4 oz (60.5 kg)  08/13/19 133 lb 6.4 oz (60.5 kg)     There are no preventive care reminders to display for this patient.  There are no preventive care reminders to display for this patient.  Lab Results  Component Value Date   TSH 1.470 05/05/2019   Lab Results  Component Value Date   WBC 2.3 (L) 08/16/2019   HGB 12.8 08/16/2019   HCT 39.7 08/16/2019   MCV 97.1 08/16/2019   PLT 30 (L) 08/16/2019   Lab Results  Component Value Date   NA 138 08/16/2019   K 6.0 (H) 08/16/2019   CO2  23 08/16/2019   GLUCOSE 89 08/16/2019   BUN 5 (L) 08/16/2019   CREATININE  0.49 08/16/2019   BILITOT 0.5 08/16/2019   ALKPHOS 162 (H) 08/16/2019   AST 74 (H) 08/16/2019   ALT 30 08/16/2019   PROT 7.8 08/16/2019   ALBUMIN 3.2 (L) 08/16/2019   CALCIUM 8.4 (L) 08/16/2019   ANIONGAP 12 08/16/2019   Lab Results  Component Value Date   CHOL 184 05/05/2019   Lab Results  Component Value Date   HDL 72 05/05/2019   Lab Results  Component Value Date   LDLCALC 89 05/05/2019   Lab Results  Component Value Date   TRIG 114 05/05/2019   Lab Results  Component Value Date   CHOLHDL 2.6 05/05/2019   Lab Results  Component Value Date   HGBA1C 4.8 08/13/2019      Assessment & Plan:   1. Thrombocytopenia ([place]) Her most recent platelet count was at 30 on 08/16/2019, from 18 on 08/13/2019. She has bruising has as few bruises over her body today. She continues to deny petechiae, gingival bleeding, epistaxis, profuse bleeding from superficial cuts, melena, menorrhagia, mentrorrhagia, hematochezia, and hematuria. She states that her appetite is good. Denies abdominal pain, nausea, vomiting, diarrhea, and constipation.  - CBC with Differential  2. History of alcohol abuse No reports of recent alcohol use.   3. Insomnia, unspecified type  4. Anxiety Stable.   5. Health care maintenance - Urinalysis Dipstick  6. Follow up She will follow up in 2 months for office visit. She will follow up for Labs only a few days prior to 09/29/2018.  No orders of the defined types were placed in this encounter.   Orders Placed This Encounter  Procedures  . CBC with Differential  . Urinalysis Dipstick    Referral Orders  No referral(s) requested today    Kathe Becton,  MSN, FNP-BC [address], [place] [address] [phone]- fax  Problem List Items Addressed This Visit      Other   Insomnia   Thrombocytopenia ([place]) - Primary   Relevant Orders   CBC with  Differential    Other Visit Diagnoses    History of alcohol abuse       Anxiety       Health care maintenance       Relevant Orders   Urinalysis Dipstick (Completed)   Follow up          No orders of the defined types were placed in this encounter.   Follow-up: No follow-ups on file.    [name], FNP

## 2019-09-08 ENCOUNTER — Telehealth: Payer: Self-pay

## 2019-09-08 ENCOUNTER — Other Ambulatory Visit: Payer: Self-pay | Admitting: Family Medicine

## 2019-09-08 ENCOUNTER — Telehealth: Payer: Self-pay | Admitting: Pharmacy Technician

## 2019-09-08 DIAGNOSIS — R0602 Shortness of breath: Secondary | ICD-10-CM

## 2019-09-08 LAB — CBC WITH DIFFERENTIAL/PLATELET
Basophils Absolute: 0 10*3/uL (ref 0.0–0.2)
Basos: 1 %
EOS (ABSOLUTE): 0.1 10*3/uL (ref 0.0–0.4)
Eos: 3 %
Hematocrit: 42.4 % (ref 34.0–46.6)
Hemoglobin: 14.2 g/dL (ref 11.1–15.9)
Immature Grans (Abs): 0 10*3/uL (ref 0.0–0.1)
Immature Granulocytes: 0 %
Lymphocytes Absolute: 0.5 10*3/uL — ABNORMAL LOW (ref 0.7–3.1)
Lymphs: 15 %
MCH: 31.6 pg (ref 26.6–33.0)
MCHC: 33.5 g/dL (ref 31.5–35.7)
MCV: 94 fL (ref 79–97)
Monocytes Absolute: 0.4 10*3/uL (ref 0.1–0.9)
Monocytes: 12 %
Neutrophils Absolute: 2.2 10*3/uL (ref 1.4–7.0)
Neutrophils: 69 %
RBC: 4.5 x10E6/uL (ref 3.77–5.28)
RDW: 15.8 % — ABNORMAL HIGH (ref 11.7–15.4)
WBC: 3.2 10*3/uL — ABNORMAL LOW (ref 3.4–10.8)

## 2019-09-08 MED ORDER — ALBUTEROL SULFATE HFA 108 (90 BASE) MCG/ACT IN AERS: 2.0000 | INHALATION_SPRAY | Freq: Four times a day (QID) | RESPIRATORY_TRACT | 11 refills | Status: DC | PRN

## 2019-09-08 MED FILL — FLUoxetine HCL 40 MG CAPS: 40 | 30 days supply | Qty: 30 | Fill #2

## 2019-09-08 MED FILL — SOFOSBUVIR-VELPATASVIR 400-: 400-100 | 28 days supply | Qty: 28 | Fill #2

## 2019-09-08 MED FILL — ALBUTEROL SULFATE HFA 108 (: 108 (90 BAS | 25 days supply | Qty: 9 | Fill #0

## 2019-09-08 NOTE — Telephone Encounter (Signed)
[facility] Patient Advocate Encounter   Received notification from [place] that prior authorization for Briana Sweeney is required.   PA submitted on 09/08/2019 Key [id number] W Status is pending, this will provide 4 weeks additional coverage    [facility] will continue to follow.  Bartholomew Crews, CPhT Specialty Pharmacy Patient [facility] Phone: [phone] Fax: [phone] 09/08/2019 8:27 AM

## 2019-09-08 NOTE — Telephone Encounter (Signed)
[facility] Patient Advocate Encounter  Prior Authorization for Raeanne Gathers has been approved.    PA# [id number] Effective dates: 09/08/2019 through 10/08/2019  Patients co-pay is $3.00.   Patient will pick up at [facility] 12/30  Bartholomew Crews, [place] Patient [facility] Phone: [phone] Fax: [phone] 09/08/2019 9:35 AM

## 2019-09-08 NOTE — Telephone Encounter (Signed)
Patient called saying that her medicare case worker told her she should be taking an inhaler from an ER visit from several weeks ago. Patient states is is suppose to be a ventolin (albuterol) inhaler. This is not in any current medication list and I only see it as a "historical prescription" which doesn't show who prescribed it at all. I asked if patient was seen in er for any sob/pneumonia/covid she states no she was seen in ER for anxiety. Please advise if this ventolin inhaler can be prescribed for patient.

## 2019-09-08 NOTE — Telephone Encounter (Signed)
Called and advised this has been sent to pharmacy. Thanks!

## 2019-09-13 ENCOUNTER — Other Ambulatory Visit (HOSPITAL_COMMUNITY): Payer: Self-pay | Admitting: Psychiatry

## 2019-09-13 MED FILL — LORAZEPAM 1 MG TABS: 1 | 20 days supply | Qty: 60 | Fill #0

## 2019-09-14 MED FILL — MIRTAZAPINE 15 MG TABLET: 15 | 30 days supply | Qty: 30 | Fill #0

## 2019-09-16 ENCOUNTER — Other Ambulatory Visit: Payer: Self-pay

## 2019-09-16 ENCOUNTER — Ambulatory Visit (INDEPENDENT_AMBULATORY_CARE_PROVIDER_SITE_OTHER): Payer: Medicaid Other | Admitting: Psychiatry

## 2019-09-16 DIAGNOSIS — F1021 Alcohol dependence, in remission: Secondary | ICD-10-CM | POA: Diagnosis not present

## 2019-09-16 DIAGNOSIS — F413 Other mixed anxiety disorders: Secondary | ICD-10-CM | POA: Diagnosis not present

## 2019-09-16 DIAGNOSIS — F1910 Other psychoactive substance abuse, uncomplicated: Secondary | ICD-10-CM | POA: Diagnosis not present

## 2019-09-16 DIAGNOSIS — F1994 Other psychoactive substance use, unspecified with psychoactive substance-induced mood disorder: Secondary | ICD-10-CM | POA: Diagnosis not present

## 2019-09-16 MED ORDER — MIRTAZAPINE 30 MG PO TABS: 30.0000 mg | ORAL_TABLET | Freq: Every day | ORAL | 1 refills | Status: DC

## 2019-09-16 MED FILL — MIRTAZAPINE 30 MG TABLET: 30 | 30 days supply | Qty: 30 | Fill #0

## 2019-09-16 NOTE — Progress Notes (Signed)
[facility] MD/PA/NP OP Progress Note  09/16/2019 2:15 PM Briana Sweeney  MRN:  [id number] Interview was conducted by phone and I verified that I was speaking with the correct person using two identifiers. I discussed the limitations of evaluation and management by telemedicine and  the availability of in person appointments. Patient expressed understanding and agreed to proceed.  Chief Complaint: Anxiety.  HPI: 49 yo divorced female with a long hx of alcohol use disorder, polysubstance abuse (cocaine, cannabis, heroin, benzodiaepines), and past diagnoses of major depressive disorder vs substance-induced mood disorder vs bipolar disorder. Diagnosis of schizophrenia is also mentioned in the record but patient is unable to provide any clear history of psychotic episodes unrelated to her drug/alcohol use. She was first treated for mental problems at age 45 when she became depressed and OD on meds. She was admitted to [facility] in [place]. She was started on lithium and haloperidol at that time. She has a hx of AVH, mood lability (goes from irritable/anixous to depressed "in minutes"), racing thoughts, anxiety attacks and insomnia. She has been twice in [facility] in 2019 (February and December) for exacerbation of depression, SI while still abusing alcohol.She has been taking fluoxetine (up to 60 mg) for over 20 years per her report. She also tried buspirone and hydroxyzine for anxiety with no clear benefit. Recently prescribed trazodone for insomnia does not appear to wok either. She has never been on any mood stabilizers except for lithium or antipsychotics apart from haloperidol. She did not like how she felt on either of those. She started abusing alcohol at age of 36. At one time she was drinking a 1/2 gallon of vodka plus some beer on a daily basis. She has a hx of alcohol withdrawal delirium. She has some periods of sobriety lasting few months at a time but cannot provide more details. She has alcohol liver  cirrhosis and also has hepatitis C. She minimizes use of illicit drugs claiming that she only uses them when "partying".  We have added mirtazapine and she reports that sleep has improved. She continues to complain primarily of severe anxiety both generalized and panic type. She has no support, struggles financially. Briana Sweeney reports that she has not been drinking alcohol since early September 2020. She no longer is taking lactulose or topiramate. She has not been taking buspirone and finds lorazepam very effective for anxiety.   Visit Diagnosis:    ICD-10-CM   1. Other mixed anxiety disorders  F41.3   2. Alcohol use disorder, severe, in early remission ([place])  F10.21   3. Substance induced mood disorder (HCC)  F19.94   4. Polysubstance abuse ([place])  F19.10     Past Psychiatric History: Please see imntake H&P.  Past Medical History:  Past Medical History:  Diagnosis Date  . Anxiety   . Bipolar affective disorder ([place])    With anxiety features  . Cirrhosis of liver ([place])    Due to alcohol and hepatitis C  . Depression   . 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 ETOHism ([place])   . GERD (gastroesophageal reflux disease)   . Hematemesis 02/10/2018  . Hepatitis C 2018   hepatitis c and alcohol related hepatitis  . Heroin abuse ([place])   . History of blood transfusion    "blood doesn't clot; I fell down and had to have a transfusion"  . History of kidney stones   . Menopause 2016  . Migraine    "when I get really stressed" (09/01/2017)  .  Schizophrenia ([place])   . Seizures ([place])    "when I run out of my RX; lots recently" (09/01/2017)    Past Surgical History:  Procedure Laterality Date  . ESOPHAGOGASTRODUODENOSCOPY N/A 09/03/2017   Procedure: ESOPHAGOGASTRODUODENOSCOPY (EGD);  Surgeon: Doran Stabler, MD;  Location: [facility];  Service: Gastroenterology;  Laterality: N/A;  . FINGER FRACTURE SURGERY Left    "shattered my pinky"  . FRACTURE SURGERY    . I & D EXTREMITY Left  09/18/2018   Procedure: IRRIGATION AND DEBRIDEMENT EXTREMITY;  Surgeon: Leanora Cover, MD;  Location: [facility];  Service: Orthopedics;  Laterality: Left;  . IR PARACENTESIS  07/23/2017  . IR PARACENTESIS  07/2017   "did it twice in the same week" (09/01/2017)  . SHOULDER OPEN ROTATOR CUFF REPAIR Right   . TOOTH EXTRACTION  08/2019  . TUBAL LIGATION    . VAGINAL HYSTERECTOMY      Family Psychiatric History: Reviewed.  Family History:  Family History  Problem Relation Age of Onset  . Lung cancer Mother 39  . Alcohol abuse Mother   . Throat cancer Father 73    Social History:  Social History   Socioeconomic History  . Marital status: Divorced    Spouse name: Not on file  . Number of children: 2  . Years of education: Not on file  . Highest education level: Not on file  Occupational History  . Occupation: applying for disability  Tobacco Use  . Smoking status: Never Smoker  . Smokeless tobacco: Never Used  Substance and Sexual Activity  . Alcohol use: Yes    Alcohol/week: 63.0 standard drinks    Types: 63 Cans of beer per week    Comment: weekly "I have cut back"; 4 days 4 vs 12 pack   . Drug use: Yes    Types: Marijuana    Comment: cocaiane not currently  . Sexual activity: Yes  Other Topics Concern  . Not on file  Social History Narrative   She moved with a boyfriend to [place] and was followed at [facility].  He died of a massive heart attack in 8/18, per her report, and so she moved back to [place] and is living with a friend.   Social Determinants of Health   Financial Resource Strain:   . Difficulty of Paying Living Expenses: Not on file  Food Insecurity:   . Worried About Charity fundraiser in the Last Year: Not on file  . Ran Out of Food in the Last Year: Not on file  Transportation Needs:   . Lack of Transportation (Medical): Not on file  . Lack of Transportation (Non-Medical): Not on file  Physical Activity:   . Days of Exercise per Week:  Not on file  . Minutes of Exercise per Session: Not on file  Stress:   . Feeling of Stress : Not on file  Social Connections:   . Frequency of Communication with Friends and Family: Not on file  . Frequency of Social Gatherings with Friends and Family: Not on file  . Attends Religious Services: Not on file  . Active Member of Clubs or Organizations: Not on file  . Attends Archivist Meetings: Not on file  . Marital Status: Not on file    Allergies: No Known Allergies  Metabolic Disorder Labs: Lab Results  Component Value Date   HGBA1C 4.8 08/13/2019   No results found for: PROLACTIN Lab Results  Component Value Date   CHOL 184  05/05/2019   TRIG 114 05/05/2019   HDL 72 05/05/2019   CHOLHDL 2.6 05/05/2019   LDLCALC 89 05/05/2019   LDLCALC 63 05/20/2018   Lab Results  Component Value Date   TSH 1.470 05/05/2019   TSH 1.480 05/20/2018    Therapeutic Level Labs: No results found for: LITHIUM No results found for: VALPROATE No components found for:  CBMZ  Current Medications: Current Outpatient Medications  Medication Sig Dispense Refill  . albuterol (VENTOLIN HFA) 108 (90 Base) MCG/ACT inhaler Inhale 2 puffs into the lungs every 6 (six) hours as needed for wheezing or shortness of breath. 8 g 11  . alum & mag hydroxide-simeth (MAALOX/MYLANTA) 200-200-20 MG/5ML suspension Take 15 mLs by mouth every 4 (four) hours as needed for indigestion or heartburn. 355 mL 0  . amLODipine (NORVASC) 5 MG tablet Take 1 tablet (5 mg total) by mouth daily. 30 tablet 3  . ARIPiprazole (ABILIFY) 5 MG tablet TAKE 1 TABLET (5 MG TOTAL) BY MOUTH DAILY. 30 tablet 1  . FLUoxetine (PROZAC) 40 MG capsule Take 1 capsule (40 mg total) by mouth daily. 30 capsule 3  . folic acid (FOLVITE) 1 MG tablet Take 1 tablet (1 mg total) by mouth daily. 30 tablet 3  . gabapentin (NEURONTIN) 300 MG capsule Take 2 capsules 2 times a day. (Patient taking differently: Take 600 mg by mouth 2 (two) times  daily. ) 120 capsule 3  . hydrOXYzine (ATARAX/VISTARIL) 25 MG tablet TAKE 1 TABLET (25 MG TOTAL) BY MOUTH EVERY 6 (SIX) HOURS AS NEEDED FOR ITCHING. 90 tablet 1  . lactulose (CHRONULAC) 10 GM/15ML solution Take 30 mLs (20 g total) by mouth 3 (three) times daily. Goal to have 2-3 BMs daily (Patient taking differently: Take 20 g by mouth 3 (three) times daily as needed for moderate constipation. Goal to have 2-3 BMs daily) 240 mL 3  . levETIRAcetam (KEPPRA) 500 MG tablet Take 1 tablet (500 mg total) by mouth 2 (two) times daily. 60 tablet 3  . LORazepam (ATIVAN) 1 MG tablet TAKE 1 TABLET (1 MG TOTAL) BY MOUTH EVERY 8 (EIGHT) HOURS AS NEEDED FOR ANXIETY. 60 tablet 1  . mirtazapine (REMERON) 30 MG tablet Take 1 tablet (30 mg total) by mouth at bedtime. 30 tablet 1  . Multiple Vitamin (MULTIVITAMIN WITH MINERALS) TABS tablet Take 1 tablet by mouth daily.    . polyethylene glycol (MIRALAX / GLYCOLAX) 17 g packet Take 17 g by mouth 2 (two) times daily. (Patient taking differently: Take 17 g by mouth daily as needed for mild constipation. ) 14 each 0  . Sofosbuvir-Velpatasvir (EPCLUSA) 400-100 MG TABS Take 1 tablet by mouth daily. Take 1 tablet by mouth daily. 28 tablet 5  . sulfamethoxazole-trimethoprim (BACTRIM DS) 800-160 MG tablet Take 1 tablet by mouth 2 (two) times daily. 14 tablet 0  . thiamine 100 MG tablet Take 1 tablet (100 mg total) by mouth daily. 30 tablet 0  . topiramate (TOPAMAX) 50 MG tablet Take 1 tablet (50 mg total) by mouth daily. 30 tablet 2   No current facility-administered medications for this visit.     Psychiatric Specialty Exam: Review of Systems  Psychiatric/Behavioral: The patient is nervous/anxious.   All other systems reviewed and are negative.   There were no vitals taken for this visit.There is no height or weight on file to calculate BMI.  General Appearance: NA  Eye Contact:  NA  Speech:  Clear and Coherent and Normal Rate  Volume:  Normal  Mood:  Anxious   Affect:  NA  Thought Process:  Goal Directed and Linear  Orientation:  Full (Time, Place, and Person)  Thought Content: Logical   Suicidal Thoughts:  No  Homicidal Thoughts:  No  Memory:  Immediate;   Good Recent;   Good Remote;   Good  Judgement:  Fair  Insight:  Fair  Psychomotor Activity:  NA  Concentration:  Concentration: Good  Recall:  Good  Fund of Knowledge: Fair  Language: Good  Akathisia:  Negative  Handed:  Right  AIMS (if indicated): not done  Assets:  Communication Skills Desire for Improvement Housing Resilience  ADL's:  Intact  Cognition: WNL  Sleep:  Good   Screenings: AIMS     Admission (Discharged) from 08/25/2018 in [facility] Admission (Discharged) from 10/28/2017 in [facility]  AIMS Total Score  0  0    AUDIT     Admission (Discharged) from 08/25/2018 in [facility] Admission (Discharged) from 10/28/2017 in [facility]  Alcohol Use Disorder Identification Test Final Score (AUDIT)  40  32    PHQ2-9     Office Visit from 05/05/2019 in [facility] Office Visit from 04/22/2019 in [facility] Office Visit from 02/19/2019 in [facility] Office Visit from 09/29/2018 in [facility] Office Visit from 12/16/2017 in [facility]  PHQ-2 Total Score  0  0  0  0  1       Assessment and Plan: 49 yo divorced female with a long hx of alcohol use disorder, polysubstance abuse (cocaine, cannabis, heroin, benzodiaepines), and past diagnoses of major depressive disorder vs substance-induced mood disorder vs bipolar disorder. Diagnosis of schizophrenia is also mentioned in the record but patient is unable to provide any clear history of psychotic episodes unrelated to her drug/alcohol use. She was first treated  for mental problems at age 65 when she became depressed and OD on meds. She was admitted to [facility] in [place]. She was started on lithium and haloperidol at that time. She has a hx of AVH, mood lability (goes from irritable/anixous to depressed "in minutes"), racing thoughts, anxiety attacks and insomnia. She has been twice in [facility] in 2019 (February and December) for exacerbation of depression, SI while still abusing alcohol.She has been taking fluoxetine (up to 60 mg) for over 20 years per her report. She also tried buspirone and hydroxyzine for anxiety with no clear benefit. Recently prescribed trazodone for insomnia does not appear to wok either. She has never been on any mood stabilizers except for lithium or antipsychotics apart from haloperidol. She did not like how she felt on either of those. She started abusing alcohol at age of 17. At one time she was drinking a 1/2 gallon of vodka plus some beer on a daily basis. She has a hx of alcohol withdrawal delirium. She has some periods of sobriety lasting few months at a time but cannot provide more details. 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 She minimizes use of illicit drugs claiming that she only uses them when "partying".  We have added mirtazapine and she reports that sleep has improved. She continues to complain primarily of severe anxiety both generalized and panic type. She has no support, struggles financially. Briana Sweeney reports that she has not been drinking alcohol since  early September 2020. She no longer is taking lactulose or topiramate. She has not been taking buspirone and finds lorazepam very effective for anxiety.   Dx: Mixed anxiety disorder; Mood disorder unspecified (Drug-induced vs bipolar disorder unspecified); Alcohol use disorder severe in early remission; Polysubstance use disorder  Plan: We will continue fluoxetine 40 mg daily, Abilify 5 mg, Remeron to 30 mg and lorazepam 1 mg prn anxiety  (bid-tid). Next appointment in two months. The plan was discussed with patient who had an opportunity to ask questions and these were all answered. I spend69minutes in phone consultationwith the patient.     Stephanie Acre, MD 09/16/2019, 2:15 PM

## 2019-09-17 ENCOUNTER — Other Ambulatory Visit (HOSPITAL_COMMUNITY): Payer: Self-pay | Admitting: Psychiatry

## 2019-09-17 MED FILL — GABAPENTIN 300 MG CAPSULE: 300 | 30 days supply | Qty: 120 | Fill #0

## 2019-09-20 MED FILL — ARIPIPRAZOLE 5 MG TABS: 5 | 30 days supply | Qty: 30 | Fill #0

## 2019-09-22 ENCOUNTER — Telehealth: Payer: Self-pay

## 2019-09-22 NOTE — Telephone Encounter (Signed)
Agree with Briana Sweeney's note.

## 2019-09-22 NOTE — Telephone Encounter (Signed)
Spoke with Briana Sweeney over the phone regarding her Briana Sweeney and the [facility] lab results from 12/23. Patient reported feeling better since starting her medication and denies issues with side effects or adherence. Congratulated patient on undetectable HCV level but emphasized taking her medication daily and coming back for further HCV RNA testing mid treatment, at the end of treatment, and for definitive cure post treatment.   Scheduled patient for next follow up visit with Cassie and lab visit for February 15 at 9:15am.

## 2019-09-23 ENCOUNTER — Telehealth: Payer: Self-pay | Admitting: Family Medicine

## 2019-09-23 NOTE — Telephone Encounter (Signed)
Briana Sweeney c/o worsening  Sore throat for 3-4 day, headache, and sinus pressure. She is has requested an antibiotic to be called into the  [facility]. Per patient: she does not have COVID.   Please advise.

## 2019-09-24 MED FILL — AMLODIPINE BESYLATE 5 MG TA: 5 | 30 days supply | Qty: 30 | Fill #1

## 2019-09-27 MED FILL — AMLODIPINE BESYLATE 5 MG TA: 5 | 30 days supply | Qty: 30 | Fill #0

## 2019-09-28 ENCOUNTER — Other Ambulatory Visit: Payer: Medicaid Other

## 2019-09-30 ENCOUNTER — Other Ambulatory Visit: Payer: Self-pay | Admitting: Podiatry

## 2019-09-30 ENCOUNTER — Encounter: Payer: Self-pay | Admitting: Podiatry

## 2019-09-30 DIAGNOSIS — M2011 Hallux valgus (acquired), right foot: Secondary | ICD-10-CM | POA: Diagnosis not present

## 2019-09-30 DIAGNOSIS — M21541 Acquired clubfoot, right foot: Secondary | ICD-10-CM | POA: Diagnosis not present

## 2019-09-30 MED ORDER — CEPHALEXIN 500 MG PO CAPS: 500.0000 mg | ORAL_CAPSULE | Freq: Three times a day (TID) | ORAL | 0 refills | Status: DC

## 2019-09-30 MED ORDER — OXYCODONE-ACETAMINOPHEN 5-325 MG PO TABS: 1.0000 | ORAL_TABLET | Freq: Four times a day (QID) | ORAL | 0 refills | Status: DC | PRN

## 2019-09-30 MED FILL — CEPHALEXIN 500 MG CAPSULE: 500 | 10 days supply | Qty: 30 | Fill #0

## 2019-09-30 MED FILL — OXYCODONE-APAP 5-325MG: 5-325 | 7 days supply | Qty: 30 | Fill #0

## 2019-09-30 NOTE — Progress Notes (Signed)
PRN postop 

## 2019-10-01 ENCOUNTER — Ambulatory Visit (INDEPENDENT_AMBULATORY_CARE_PROVIDER_SITE_OTHER): Payer: Medicaid Other | Admitting: Podiatry

## 2019-10-01 ENCOUNTER — Emergency Department (HOSPITAL_COMMUNITY)
Admission: EM | Admit: 2019-10-01 | Discharge: 2019-10-01 | Disposition: A | Discharge status: Home / Self Care | Payer: Medicaid Other | Attending: Emergency Medicine | Admitting: Emergency Medicine

## 2019-10-01 ENCOUNTER — Ambulatory Visit (INDEPENDENT_AMBULATORY_CARE_PROVIDER_SITE_OTHER): Payer: Medicaid Other

## 2019-10-01 ENCOUNTER — Telehealth: Payer: Self-pay | Admitting: *Deleted

## 2019-10-01 ENCOUNTER — Other Ambulatory Visit: Payer: Self-pay

## 2019-10-01 DIAGNOSIS — M216X1 Other acquired deformities of right foot: Secondary | ICD-10-CM

## 2019-10-01 DIAGNOSIS — M21611 Bunion of right foot: Secondary | ICD-10-CM

## 2019-10-01 DIAGNOSIS — Z5321 Procedure and treatment not carried out due to patient leaving prior to being seen by health care provider: Secondary | ICD-10-CM | POA: Insufficient documentation

## 2019-10-01 DIAGNOSIS — M21621 Bunionette of right foot: Secondary | ICD-10-CM

## 2019-10-01 DIAGNOSIS — D696 Thrombocytopenia, unspecified: Secondary | ICD-10-CM | POA: Diagnosis not present

## 2019-10-01 LAB — COMPREHENSIVE METABOLIC PANEL
ALT: 25 U/L (ref 0–44)
AST: 47 U/L — ABNORMAL HIGH (ref 15–41)
Albumin: 3.2 g/dL — ABNORMAL LOW (ref 3.5–5.0)
Alkaline Phosphatase: 185 U/L — ABNORMAL HIGH (ref 38–126)
Anion gap: 10 (ref 5–15)
BUN: 5 mg/dL — ABNORMAL LOW (ref 6–20)
CO2: 22 mmol/L (ref 22–32)
Calcium: 8.9 mg/dL (ref 8.9–10.3)
Chloride: 106 mmol/L (ref 98–111)
Creatinine, Ser: 0.75 mg/dL (ref 0.44–1.00)
GFR calc Af Amer: >60 mL/min (ref >60)
GFR calc non Af Amer: >60 mL/min (ref >60)
Glucose, Bld: 102 mg/dL — ABNORMAL HIGH (ref 70–99)
Potassium: 3.8 mmol/L (ref 3.5–5.1)
Sodium: 138 mmol/L (ref 135–145)
Total Bilirubin: 1.4 mg/dL — ABNORMAL HIGH (ref 0.3–1.2)
Total Protein: 7.6 g/dL (ref 6.5–8.1)

## 2019-10-01 LAB — CBC WITH DIFFERENTIAL/PLATELET
Abs Immature Granulocytes: 0.01 10*3/uL (ref 0.00–0.07)
Basophils Absolute: 0 10*3/uL (ref 0.0–0.1)
Basophils Relative: 0 %
Eosinophils Absolute: 0 10*3/uL (ref 0.0–0.5)
Eosinophils Relative: 0 %
HCT: 42.9 % (ref 36.0–46.0)
Hemoglobin: 14.3 g/dL (ref 12.0–15.0)
Immature Granulocytes: 0 %
Lymphocytes Relative: 15 %
Lymphs Abs: 0.7 10*3/uL (ref 0.7–4.0)
MCH: 32.1 pg (ref 26.0–34.0)
MCHC: 33.3 g/dL (ref 30.0–36.0)
MCV: 96.4 fL (ref 80.0–100.0)
Monocytes Absolute: 0.5 10*3/uL (ref 0.1–1.0)
Monocytes Relative: 12 %
Neutro Abs: 3.3 10*3/uL (ref 1.7–7.7)
Neutrophils Relative %: 73 %
Platelets: 31 10*3/uL — ABNORMAL LOW (ref 150–400)
RBC: 4.45 MIL/uL (ref 3.87–5.11)
RDW: 14.7 % (ref 11.5–15.5)
WBC: 4.6 10*3/uL (ref 4.0–10.5)
nRBC: 0 % (ref 0.0–0.2)

## 2019-10-01 NOTE — Telephone Encounter (Signed)
Pt called states she had surgery with Dr. Amalia Hailey yesterday and she is bleeding through her bandage. Pt has an appt today with Dr. Posey Pronto for evaluation and redress of the surgery site. Left message apologizing for the late call and that I was glad she got in to see Dr. Posey Pronto.

## 2019-10-01 NOTE — ED Triage Notes (Signed)
Pt here from foot doctor , she had a bunion removed yesterday and went back today to that doctor to have another stitch placed and rewrapped , pt sent to ED for lab work due to history of low platelets

## 2019-10-01 NOTE — Progress Notes (Signed)
Subjective:  Patient ID: Briana Sweeney, female    DOB: Jan 19, 1971,  MRN: [id number]  Chief Complaint  Patient presents with  . Routine Post Op    surgery with Dr Amalia Hailey yesterday and having alot of bleeding.hx of low platlets..double osteotomy and metatarsal osteotomy 5th on 1.21.2021,     49 y.o. female returns for post-op check.  Patient presents today after undergoing bunion correction with tailor bunion correction to theRight foot.  Patient states that she has not been ambulating on it.  Patient states that she is still completely numb from a block that she was given prior to surgery.  She states that it has been bleeding slowly for the last 24 hours after the surgery.  She denies any other acute complaints.  She states that she was feeling a little lightheaded in office today.  She has a history of low platelet count that could be the inciting factor.  I have asked her to go to the emergency room if she does not feel well.  Review of Systems: Negative except as noted in the HPI. Denies N/V/F/Ch.  Past Medical History:  Diagnosis Date  . Anxiety   . Bipolar affective disorder ([place])    With anxiety features  . Cirrhosis of liver ([place])    Due to alcohol and hepatitis C  . Depression   . 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 ETOHism ([place])   . GERD (gastroesophageal reflux disease)   . Hematemesis 02/10/2018  . Hepatitis C 2018   hepatitis c and alcohol related hepatitis  . Heroin abuse ([place])   . History of blood transfusion    "blood doesn't clot; I fell down and had to have a transfusion"  . History of kidney stones   . Menopause 2016  . Migraine    "when I get really stressed" (09/01/2017)  . Schizophrenia ([place])   . Seizures ([place])    "when I run out of my RX; lots recently" (09/01/2017)    Current Outpatient Medications:  .  albuterol (VENTOLIN HFA) 108 (90 Base) MCG/ACT inhaler, Inhale 2 puffs into the lungs every 6 (six) hours as needed for wheezing or shortness  of breath., Disp: 8 g, Rfl: 11 .  alum & mag hydroxide-simeth (MAALOX/MYLANTA) 200-200-20 MG/5ML suspension, Take 15 mLs by mouth every 4 (four) hours as needed for indigestion or heartburn., Disp: 355 mL, Rfl: 0 .  amLODipine (NORVASC) 5 MG tablet, Take 1 tablet (5 mg total) by mouth daily., Disp: 30 tablet, Rfl: 3 .  ARIPiprazole (ABILIFY) 5 MG tablet, TAKE 1 TABLET (5 MG TOTAL) BY MOUTH DAILY., Disp: 30 tablet, Rfl: 1 .  cephALEXin (KEFLEX) 500 MG capsule, Take 1 capsule (500 mg total) by mouth 3 (three) times daily., Disp: 30 capsule, Rfl: 0 .  FLUoxetine (PROZAC) 40 MG capsule, Take 1 capsule (40 mg total) by mouth daily., Disp: 30 capsule, Rfl: 3 .  folic acid (FOLVITE) 1 MG tablet, Take 1 tablet (1 mg total) by mouth daily., Disp: 30 tablet, Rfl: 3 .  gabapentin (NEURONTIN) 300 MG capsule, Take 2 capsules 2 times a day. (Patient taking differently: Take 600 mg by mouth 2 (two) times daily. ), Disp: 120 capsule, Rfl: 3 .  hydrOXYzine (ATARAX/VISTARIL) 25 MG tablet, TAKE 1 TABLET (25 MG TOTAL) BY MOUTH EVERY 6 (SIX) HOURS AS NEEDED FOR ITCHING., Disp: 90 tablet, Rfl: 1 .  lactulose (CHRONULAC) 10 GM/15ML solution, Take 30 mLs (20 g total) by mouth 3 (three) times daily. Goal  to have 2-3 BMs daily (Patient taking differently: Take 20 g by mouth 3 (three) times daily as needed for moderate constipation. Goal to have 2-3 BMs daily), Disp: 240 mL, Rfl: 3 .  levETIRAcetam (KEPPRA) 500 MG tablet, Take 1 tablet (500 mg total) by mouth 2 (two) times daily., Disp: 60 tablet, Rfl: 3 .  LORazepam (ATIVAN) 1 MG tablet, TAKE 1 TABLET (1 MG TOTAL) BY MOUTH EVERY 8 (EIGHT) HOURS AS NEEDED FOR ANXIETY., Disp: 60 tablet, Rfl: 1 .  mirtazapine (REMERON) 30 MG tablet, Take 1 tablet (30 mg total) by mouth at bedtime., Disp: 30 tablet, Rfl: 1 .  Multiple Vitamin (MULTIVITAMIN WITH MINERALS) TABS tablet, Take 1 tablet by mouth daily., Disp:  , Rfl:  .  oxyCODONE-acetaminophen (PERCOCET) 5-325 MG tablet, Take 1 tablet  by mouth every 6 (six) hours as needed for severe pain., Disp: 30 tablet, Rfl: 0 .  polyethylene glycol (MIRALAX / GLYCOLAX) 17 g packet, Take 17 g by mouth 2 (two) times daily. (Patient taking differently: Take 17 g by mouth daily as needed for mild constipation. ), Disp: 14 each, Rfl: 0 .  Sofosbuvir-Velpatasvir (EPCLUSA) 400-100 MG TABS, Take 1 tablet by mouth daily. Take 1 tablet by mouth daily., Disp: 28 tablet, Rfl: 5 .  sulfamethoxazole-trimethoprim (BACTRIM DS) 800-160 MG tablet, Take 1 tablet by mouth 2 (two) times daily., Disp: 14 tablet, Rfl: 0 .  thiamine 100 MG tablet, Take 1 tablet (100 mg total) by mouth daily., Disp: 30 tablet, Rfl: 0 .  topiramate (TOPAMAX) 50 MG tablet, Take 1 tablet (50 mg total) by mouth daily., Disp: 30 tablet, Rfl: 2  Social History   Tobacco Use  Smoking Status Never Smoker  Smokeless Tobacco Never Used    No Known Allergies Objective:  There were no vitals filed for this visit. There is no height or weight on file to calculate BMI. Constitutional Well developed. Well nourished.  Vascular Foot warm and well perfused. Capillary refill normal to all digits.   Neurologic Normal speech. Oriented to person, place, and time. Epicritic sensation to light touch grossly present bilaterally.  Dermatologic Skin healing well without signs of infection. Skin edges well coapted without signs of infection.  Bleeding noted from the incision site oozing in nature.  No active bleeding.  Orthopedic: Tenderness to palpation noted about the surgical site.   Radiographs: 2 views of skeletally mature adult foot: Good correction and alignment noted.  No hardware failure or loosening noted.  No loss of correction noted. Assessment:   1. Tailor's bunionette, right   2. Bunion, right    Plan:  Patient was evaluated and treated and all questions answered.  S/p foot surgery right -Progressing as expected post-operatively. -XR: See above -WB Status: Weightbearing as  tolerated in postop shoe -Sutures: Intact.  I reinforced the incision with 3-0 Prolene to stop the bleeding to both the proximal and distal end of the incision.  Good hemostasis noted.  Surgicel with dry sterile dressing was applied. -Medications: None -Foot redressed.  No follow-ups on file.

## 2019-10-04 ENCOUNTER — Telehealth: Payer: Self-pay | Admitting: Family Medicine

## 2019-10-04 ENCOUNTER — Encounter: Payer: Self-pay | Admitting: Podiatry

## 2019-10-04 NOTE — Telephone Encounter (Signed)
[facility] has been advised that Briana Sweeney will need an office visit. To be evaluated for incontinence supplies. Also a verbal was given to correct her height and weight.

## 2019-10-06 ENCOUNTER — Other Ambulatory Visit: Payer: Self-pay

## 2019-10-06 ENCOUNTER — Ambulatory Visit (INDEPENDENT_AMBULATORY_CARE_PROVIDER_SITE_OTHER): Payer: Medicaid Other | Admitting: Podiatry

## 2019-10-06 DIAGNOSIS — Z9889 Other specified postprocedural states: Secondary | ICD-10-CM

## 2019-10-06 DIAGNOSIS — M21611 Bunion of right foot: Secondary | ICD-10-CM

## 2019-10-06 DIAGNOSIS — M21621 Bunionette of right foot: Secondary | ICD-10-CM

## 2019-10-06 MED ORDER — OXYCODONE-ACETAMINOPHEN 5-325 MG PO TABS: 1.0000 | ORAL_TABLET | Freq: Four times a day (QID) | ORAL | 0 refills | Status: DC | PRN

## 2019-10-06 MED FILL — OXYCODONE-APAP 5-325MG: 5-325 | 7 days supply | Qty: 30 | Fill #0

## 2019-10-07 ENCOUNTER — Telehealth: Payer: Self-pay | Admitting: Podiatry

## 2019-10-07 ENCOUNTER — Ambulatory Visit (INDEPENDENT_AMBULATORY_CARE_PROVIDER_SITE_OTHER): Payer: Medicaid Other | Admitting: Podiatry

## 2019-10-07 DIAGNOSIS — M21611 Bunion of right foot: Secondary | ICD-10-CM

## 2019-10-07 DIAGNOSIS — Z9889 Other specified postprocedural states: Secondary | ICD-10-CM

## 2019-10-07 NOTE — Telephone Encounter (Signed)
Patient is requesting a new pain rx stating the oxy 5 is not working.  States was told to double up on them by you but that isn't working either so she quit taking them & is now only taking tylenol 2 pills every 6 hrs for the pain.  Uses [employer]  Wasn't consistent with her story and seemed to be drug seeking behavior.

## 2019-10-08 NOTE — Telephone Encounter (Signed)
No different pain medicines will be prescribed. - Thanks, Dr. Amalia Hailey

## 2019-10-09 NOTE — Progress Notes (Signed)
   Subjective:  Patient presents today status post bunionectomy and Tailor's bunionectomy right. DOS: 09/30/2019. She reports significant pain of the foot. She reports active bleeding from the surgical site. She states she has to walk up multiple stairs to get to her bedroom which makes the pain worse. She has been using the CAM boot and taking Percocet for pain as directed. Patient is here for further evaluation and treatment.    Past Medical History:  Diagnosis Date  . Anxiety   . Bipolar affective disorder ([place])    With anxiety features  . Cirrhosis of liver ([place])    Due to alcohol and hepatitis C  . Depression   . 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 ETOHism ([place])   . GERD (gastroesophageal reflux disease)   . Hematemesis 02/10/2018  . Hepatitis C 2018   hepatitis c and alcohol related hepatitis  . Heroin abuse ([place])   . History of blood transfusion    "blood doesn't clot; I fell down and had to have a transfusion"  . History of kidney stones   . Menopause 2016  . Migraine    "when I get really stressed" (09/01/2017)  . Schizophrenia ([place])   . Seizures ([place])    "when I run out of my RX; lots recently" (09/01/2017)      Objective/Physical Exam Neurovascular status intact.  Skin incisions appear to be well coapted with sutures and staples intact. No sign of infectious process noted. No dehiscence. No active bleeding noted. Moderate edema noted to the surgical extremity.  Assessment: 1. S/p bunionectomy and Tailor's bunionectomy right. DOS: 09/30/2019   Plan of Care:  1. Patient was evaluated.  2. Dressing changed. Keep clean, dry and intact for one week.  3. Continue weightbearing in CAM boot. Minimal walking.  4. Refill prescription for Percocet 5/325 mg provided to patient.  5. Return to clinic in one week.    Edrick Kins, DPM [facility]  Dr. Edrick Kins, [place]                                        [place], [place]  [address]                Office [phone]  Fax [phone]

## 2019-10-12 ENCOUNTER — Telehealth: Payer: Self-pay | Admitting: Pharmacy Technician

## 2019-10-12 MED FILL — SOFOSBUVIR-VELPATASVIR 400-: 400-100 | 28 days supply | Qty: 28 | Fill #3

## 2019-10-12 MED FILL — LORazepam 1 MG TABS: 1 | 20 days supply | Qty: 60 | Fill #1

## 2019-10-12 NOTE — Telephone Encounter (Addendum)
[facility] Patient Advocate Encounter  Prior Authorization for Briana Sweeney has been approved.    PA# [id number] Effective dates: 10/12/2019 through 11/09/2019   Patients co-pay is $3.   [facility] will continue to follow.  Venida Jarvis. Nadara Mustard Broussard Patient [facility] Phone: [phone] Fax:  [phone]

## 2019-10-13 ENCOUNTER — Ambulatory Visit (INDEPENDENT_AMBULATORY_CARE_PROVIDER_SITE_OTHER): Payer: Medicaid Other | Admitting: Podiatry

## 2019-10-13 ENCOUNTER — Other Ambulatory Visit: Payer: Self-pay

## 2019-10-13 DIAGNOSIS — Z9889 Other specified postprocedural states: Secondary | ICD-10-CM

## 2019-10-13 DIAGNOSIS — M21621 Bunionette of right foot: Secondary | ICD-10-CM

## 2019-10-13 DIAGNOSIS — M21611 Bunion of right foot: Secondary | ICD-10-CM

## 2019-10-13 MED ORDER — OXYCODONE-ACETAMINOPHEN 10-325 MG PO TABS: 1.0000 | ORAL_TABLET | Freq: Four times a day (QID) | ORAL | 0 refills | Status: AC | PRN

## 2019-10-13 MED FILL — OXYCODONE-APAP 10-325: 10-325 | 5 days supply | Qty: 20 | Fill #0

## 2019-10-13 MED FILL — ARIPIPRAZOLE 5 MG TABS: 5 | 30 days supply | Qty: 30 | Fill #1

## 2019-10-15 ENCOUNTER — Other Ambulatory Visit: Payer: Self-pay

## 2019-10-15 DIAGNOSIS — Y929 Unspecified place or not applicable: Secondary | ICD-10-CM | POA: Diagnosis not present

## 2019-10-15 DIAGNOSIS — Y939 Activity, unspecified: Secondary | ICD-10-CM | POA: Insufficient documentation

## 2019-10-15 DIAGNOSIS — Y999 Unspecified external cause status: Secondary | ICD-10-CM | POA: Insufficient documentation

## 2019-10-15 DIAGNOSIS — Z79899 Other long term (current) drug therapy: Secondary | ICD-10-CM | POA: Insufficient documentation

## 2019-10-15 DIAGNOSIS — F10920 Alcohol use, unspecified with intoxication, uncomplicated: Secondary | ICD-10-CM | POA: Insufficient documentation

## 2019-10-15 DIAGNOSIS — S01311A Laceration without foreign body of right ear, initial encounter: Secondary | ICD-10-CM | POA: Insufficient documentation
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
## 2019-10-16 ENCOUNTER — Encounter (HOSPITAL_COMMUNITY): Payer: Self-pay | Admitting: Emergency Medicine

## 2019-10-16 ENCOUNTER — Emergency Department (HOSPITAL_COMMUNITY)
Admission: EM | Admit: 2019-10-16 | Discharge: 2019-10-16 | Disposition: A | Discharge status: Home / Self Care | Payer: Medicaid Other | Attending: Emergency Medicine | Admitting: Emergency Medicine

## 2019-10-16 ENCOUNTER — Other Ambulatory Visit: Payer: Self-pay

## 2019-10-16 DIAGNOSIS — S01311A Laceration without foreign body of right ear, initial encounter: Secondary | ICD-10-CM

## 2019-10-16 DIAGNOSIS — F10920 Alcohol use, unspecified with intoxication, uncomplicated: Secondary | ICD-10-CM
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
## 2019-10-17 NOTE — Progress Notes (Signed)
   Subjective:  Patient presents today status post bunionectomy and Tailor's bunionectomy right. DOS: 09/30/2019. She reports intermittent sharp pain with associated swelling. She reports walking up and down her stairs at home frequently which increases the symptoms. She hs been using the CAM boot and taking Percocet for treatment. Patient is here for further evaluation and treatment.   Past Medical History:  Diagnosis Date  . Anxiety   . Bipolar affective disorder ([place])    With anxiety features  . Cirrhosis of liver ([place])    Due to alcohol and hepatitis C  . Depression   . 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 ETOHism ([place])   . GERD (gastroesophageal reflux disease)   . Hematemesis 02/10/2018  . Hepatitis C 2018   hepatitis c and alcohol related hepatitis  . Heroin abuse ([place])   . History of blood transfusion    "blood doesn't clot; I fell down and had to have a transfusion"  . History of kidney stones   . Menopause 2016  . Migraine    "when I get really stressed" (09/01/2017)  . Schizophrenia ([place])   . Seizures ([place])    "when I run out of my RX; lots recently" (09/01/2017)      Objective/Physical Exam Neurovascular status intact. Minimal dehiscence noted along bunion incision site. No active bleeding noted. Heavy edema noted to the surgical extremity.  Assessment: 1. S/p bunionectomy and Tailor's bunionectomy right. DOS: 09/30/2019   Plan of Care:  1. Patient was evaluated.  2. Unna boot applied. Keep clean dry and intact for one week.  3. Continue using CAM boot.  4. Continue taking oral antibiotics as prescribed.  5. Return to clinic in one week.   Edrick Kins, DPM [facility]  Dr. Edrick Kins, [place]                                        [place], [place] [address]                Office [phone]  Fax [phone]

## 2019-10-20 ENCOUNTER — Ambulatory Visit (INDEPENDENT_AMBULATORY_CARE_PROVIDER_SITE_OTHER): Payer: Medicaid Other

## 2019-10-20 ENCOUNTER — Ambulatory Visit (INDEPENDENT_AMBULATORY_CARE_PROVIDER_SITE_OTHER): Payer: Medicaid Other | Admitting: Podiatry

## 2019-10-20 ENCOUNTER — Other Ambulatory Visit: Payer: Self-pay

## 2019-10-20 ENCOUNTER — Encounter: Payer: Self-pay | Admitting: Podiatry

## 2019-10-20 DIAGNOSIS — M21621 Bunionette of right foot: Secondary | ICD-10-CM

## 2019-10-20 DIAGNOSIS — Z9889 Other specified postprocedural states: Secondary | ICD-10-CM

## 2019-10-20 DIAGNOSIS — M216X1 Other acquired deformities of right foot: Secondary | ICD-10-CM

## 2019-10-20 MED ORDER — OXYCODONE-ACETAMINOPHEN 5-325 MG PO TABS: 1.0000 | ORAL_TABLET | Freq: Four times a day (QID) | ORAL | 0 refills | Status: DC | PRN

## 2019-10-20 MED FILL — OXYCODONE-APAP 5-325MG: 5-325 | 7 days supply | Qty: 30 | Fill #0

## 2019-10-24 NOTE — Progress Notes (Signed)
   Subjective:  Patient presents today status post bunionectomy and Tailor's bunionectomy right. DOS: 09/30/2019. She reports continued pain with associated swelling. She has been taking Percocet with some relief. She has been using the CAM boot as directed. She denies any worsening factors at this time. Patient is here for further evaluation and treatment.   Past Medical History:  Diagnosis Date  . Anxiety   . Bipolar affective disorder ([place])    With anxiety features  . Cirrhosis of liver ([place])    Due to alcohol and hepatitis C  . Depression   . 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 ETOHism ([place])   . GERD (gastroesophageal reflux disease)   . Hematemesis 02/10/2018  . Hepatitis C 2018   hepatitis c and alcohol related hepatitis  . Heroin abuse ([place])   . History of blood transfusion    "blood doesn't clot; I fell down and had to have a transfusion"  . History of kidney stones   . Menopause 2016  . Migraine    "when I get really stressed" (09/01/2017)  . Schizophrenia ([place])   . Seizures ([place])    "when I run out of my RX; lots recently" (09/01/2017)      Objective/Physical Exam Neurovascular status intact. Minimal dehiscence noted along bunion incision site. No active bleeding noted. Heavy edema noted to the surgical extremity.  Radiographic Exam:  Orthopedic hardware and osteotomies sites appear to be stable with routine healing.  Assessment: 1. S/p bunionectomy and Tailor's bunionectomy right. DOS: 09/30/2019 2. Edema right surgical foot    Plan of Care:  1. Patient was evaluated. X-Rays reviewed.  2. Unna boot reapplied today.  3. Continue weightbearing in CAM boot.  4. Refill prescription for Percocet 5/325 mg q6h provided to patient.  5. Return to clinic in one week.    Edrick Kins, DPM [facility]  Dr. Edrick Kins, [place]                                        [place], [place] [address]                Office [phone]  Fax [phone]

## 2019-10-25 ENCOUNTER — Ambulatory Visit (INDEPENDENT_AMBULATORY_CARE_PROVIDER_SITE_OTHER): Payer: Medicaid Other | Admitting: Pharmacist

## 2019-10-25 ENCOUNTER — Other Ambulatory Visit: Payer: Self-pay

## 2019-10-25 DIAGNOSIS — K7290 Hepatic failure, unspecified without coma: Secondary | ICD-10-CM | POA: Diagnosis not present

## 2019-10-25 DIAGNOSIS — B182 Chronic viral hepatitis C: Secondary | ICD-10-CM

## 2019-10-25 DIAGNOSIS — K7460 Unspecified cirrhosis of liver: Secondary | ICD-10-CM

## 2019-10-25 MED FILL — MIRTAZAPINE 30 MG TABLET: 30 | 30 days supply | Qty: 30 | Fill #1

## 2019-10-25 NOTE — Progress Notes (Signed)
HPI: Briana Sweeney is a 49 y.o. female who presents to the [facility] clinic for Hepatitis C follow-up.  Medication: Epclusa x 24 weeks  Start Date: 07/20/19  Hepatitis C Genotype: 1a  Fibrosis Score: F4  Hepatitis C RNA: 49,500  Patient Active Problem List   Diagnosis Date Noted  . History of alcohol abuse 09/07/2019  . Insomnia 05/05/2019  . Dizziness 04/02/2019  . Bruising 04/02/2019  . Hyponatremia 04/02/2019  . Head injury   . Scalp laceration, initial encounter   . Fall 01/20/2019  . Epistaxis 01/20/2019  . Altered mental status   . 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 Other mixed anxiety disorders 10/27/2018  . Neuropathy 10/27/2018  . Abscess of bursa of left elbow 09/23/2018  . Olecranon bursitis of left elbow   . Erysipelas 09/13/2018  . Acute metabolic encephalopathy 0000000  . Overdose 08/22/2018  . Hypotension 08/22/2018  . Seizure ([place]) 08/22/2018  . Substance induced mood disorder ([place]) 07/17/2018  . Cocaine dependence without complication ([place]) [id number]  . Polysubstance abuse ([place]) 04/08/2018  . Encephalopathy, 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[place]) 04/08/2018  . Hepatitis C 03/18/2018  . Portal hypertension ([place]) 03/18/2018  . Alcoholic intoxication without complication ([place]) [id number]  . Depression 03/18/2018  . Upper GI bleed 02/10/2018  . Alcohol use disorder, severe, in early remission ([place]) 02/10/2018  . Chronic anemia   . Thrombocytopenia due to drugs   . Suicidal ideation   . Thrombocytopenia ([place]) 01/02/2018  . GERD (gastroesophageal reflux disease) 11/01/2017  . Trichimoniasis 10/30/2017  . Alcohol dependence ([place]) 10/29/2017  . MDD (major depressive disorder), severe ([place]) 10/28/2017  . Acute hyperactive alcohol withdrawal delirium ([place]) 10/09/2017  . Schizophrenia ([place]) 10/09/2017  . Alcohol abuse with alcohol-induced mood disorder ([place])   . Alcohol withdrawal syndrome with complication ([place]) [id number]  . Esophageal varices  without bleeding ([place])   . Hematemesis 09/01/2017  . Ascites due to alcoholic cirrhosis ([place])   . Decompensated hepatic cirrhosis ([place]) 07/23/2017  . Alcohol abuse 07/23/2017  . Hepatitis C antibody positive in blood 07/23/2017  . Hypokalemia 07/23/2017  . Jaundice 07/23/2017  . Coagulopathy ([place]) 07/23/2017  . Hypomagnesemia 07/23/2017  . Pancytopenia ([place]) 07/23/2017  . Alcoholic cirrhosis of liver with ascites ([place]) 07/23/2017  . Bacterial vaginosis 06/03/2017  . UTI (urinary tract infection) 06/02/2017    Patient's Medications  New Prescriptions   No medications on file  Previous Medications   ALBUTEROL (VENTOLIN HFA) 108 (90 BASE) MCG/ACT INHALER    Inhale 2 puffs into the lungs every 6 (six) hours as needed for wheezing or shortness of breath.   ALUM & MAG HYDROXIDE-SIMETH (MAALOX/MYLANTA) 200-200-20 MG/5ML SUSPENSION    Take 15 mLs by mouth every 4 (four) hours as needed for indigestion or heartburn.   AMLODIPINE (NORVASC) 5 MG TABLET    Take 1 tablet (5 mg total) by mouth daily.   ARIPIPRAZOLE (ABILIFY) 5 MG TABLET    TAKE 1 TABLET (5 MG TOTAL) BY MOUTH DAILY.   CEPHALEXIN (KEFLEX) 500 MG CAPSULE    Take 1 capsule (500 mg total) by mouth 3 (three) times daily.   FLUOXETINE (PROZAC) 40 MG CAPSULE    Take 1 capsule (40 mg total) by mouth daily.   FOLIC ACID (FOLVITE) 1 MG TABLET    Take 1 tablet (1 mg total) by mouth daily.   GABAPENTIN (NEURONTIN) 300 MG CAPSULE    Take 2 capsules 2 times a day.   HYDROXYZINE (ATARAX/VISTARIL) 25 MG TABLET  TAKE 1 TABLET (25 MG TOTAL) BY MOUTH EVERY 6 (SIX) HOURS AS NEEDED FOR ITCHING.   LACTULOSE (CHRONULAC) 10 GM/15ML SOLUTION    Take 30 mLs (20 g total) by mouth 3 (three) times daily. Goal to have 2-3 BMs daily   LEVETIRACETAM (KEPPRA) 500 MG TABLET    Take 1 tablet (500 mg total) by mouth 2 (two) times daily.   LORAZEPAM (ATIVAN) 1 MG TABLET    TAKE 1 TABLET (1 MG TOTAL) BY MOUTH EVERY 8 (EIGHT) HOURS AS NEEDED FOR ANXIETY.   MIRTAZAPINE  (REMERON) 15 MG TABLET    Take 15 mg by mouth at bedtime.   MIRTAZAPINE (REMERON) 30 MG TABLET    Take 1 tablet (30 mg total) by mouth at bedtime.   MULTIPLE VITAMIN (MULTIVITAMIN WITH MINERALS) TABS TABLET    Take 1 tablet by mouth daily.   OXYCODONE-ACETAMINOPHEN (PERCOCET) 5-325 MG TABLET    Take 1 tablet by mouth every 6 (six) hours as needed for severe pain.   POLYETHYLENE GLYCOL (MIRALAX / GLYCOLAX) 17 G PACKET    Take 17 g by mouth 2 (two) times daily.   SOFOSBUVIR-VELPATASVIR (EPCLUSA) 400-100 MG TABS    Take 1 tablet by mouth daily. Take 1 tablet by mouth daily.   SULFAMETHOXAZOLE-TRIMETHOPRIM (BACTRIM DS) 800-160 MG TABLET    Take 1 tablet by mouth 2 (two) times daily.   THIAMINE 100 MG TABLET    Take 1 tablet (100 mg total) by mouth daily.   TOPIRAMATE (TOPAMAX) 50 MG TABLET    Take 1 tablet (50 mg total) by mouth daily.  Modified Medications   No medications on file  Discontinued Medications   No medications on file    Allergies: No Known Allergies  Past Medical History: Past Medical History:  Diagnosis Date  . Anxiety   . Bipolar affective disorder ([place])    With anxiety features  . Cirrhosis of liver ([place])    Due to alcohol and hepatitis C  . Depression   . 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 ETOHism ([place])   . GERD (gastroesophageal reflux disease)   . Hematemesis 02/10/2018  . Hepatitis C 2018   hepatitis c and alcohol related hepatitis  . Heroin abuse ([place])   . History of blood transfusion    "blood doesn't clot; I fell down and had to have a transfusion"  . History of kidney stones   . Menopause 2016  . Migraine    "when I get really stressed" (09/01/2017)  . Schizophrenia ([place])   . Seizures ([place])    "when I run out of my RX; lots recently" (09/01/2017)    Social History: Social History   Socioeconomic History  . Marital status: Divorced    Spouse name: Not on file  . Number of children: 2  . Years of education: Not on file  . Highest  education level: Not on file  Occupational History  . Occupation: applying for disability  Tobacco Use  . Smoking status: Never Smoker  . Smokeless tobacco: Never Used  Substance and Sexual Activity  . Alcohol use: Yes    Alcohol/week: 63.0 standard drinks    Types: 63 Cans of beer per week    Comment: weekly "I have cut back"; 4 days 4 vs 12 pack   . Drug use: Yes    Types: Marijuana    Comment: cocaiane not currently  . Sexual activity: Yes  Other Topics Concern  . Not on file  Social History  Narrative   She moved with a boyfriend to [place] and was followed at [facility].  He died of a massive heart attack in 8/18, per her report, and so she moved back to [place] and is living with a friend.   Social Determinants of Health   Financial Resource Strain:   . Difficulty of Paying Living Expenses: Not on file  Food Insecurity:   . Worried About Charity fundraiser in the Last Year: Not on file  . Ran Out of Food in the Last Year: Not on file  Transportation Needs:   . Lack of Transportation (Medical): Not on file  . Lack of Transportation (Non-Medical): Not on file  Physical Activity:   . Days of Exercise per Week: Not on file  . Minutes of Exercise per Session: Not on file  Stress:   . Feeling of Stress : Not on file  Social Connections:   . Frequency of Communication with Friends and Family: Not on file  . Frequency of Social Gatherings with Friends and Family: Not on file  . Attends Religious Services: Not on file  . Active Member of Clubs or Organizations: Not on file  . Attends Archivist Meetings: Not on file  . Marital Status: Not on file    Labs: Hepatitis C Lab Results  Component Value Date   HCVGENOTYPE Comment 04/08/2019   HCVRNAPCRQN <15 NOT DETECTED 09/01/2019   Hepatitis B Lab Results  Component Value Date   HEPBSAG Negative 08/27/2018   Hepatitis A No results found for: HAV HIV Lab Results  Component Value Date   HIV Non  Reactive 11/21/2018   HIV Non Reactive 10/13/2017   HIV Non Reactive 07/19/2017   Lab Results  Component Value Date   CREATININE 0.75 10/01/2019   CREATININE 0.49 08/16/2019   CREATININE 0.59 07/14/2019   CREATININE 0.67 07/13/2019   CREATININE 0.62 07/12/2019   Lab Results  Component Value Date   AST 47 (H) 10/01/2019   AST 74 (H) 08/16/2019   AST 41 07/14/2019   ALT 25 10/01/2019   ALT 30 08/16/2019   ALT 33 07/14/2019   INR 1.0 08/16/2019   INR 1.3 (H) 07/05/2019   INR 1.1 05/06/2019    Assessment: Lillieanne arrives in clinic for hepatitis C follow-up. Risk factors: polysubstance abuse. Teeya was a heavy alcohol drinker, however she has cut back over the past few months. She is considered treatment-naive with HCV RNA of 49,500, genotype 1a, and an abdominal imaging with CT scan showing cirrhosis, F4. She is considered decompensated cirrohsis due being hospitalized for ascites of the liver and thrombocytopenia in August 2020. Her treatment regimen is Epclusa x 24 weeks. Excluded from taking ribavirin due to thrombocytopenia (platelets 30). Her last HCV RNA in December 2020 was undetectable.   Yeila states she has been taking Epclusa once daily, but she missed a dose 2 days in a row last weekend when she went out of town. Advised the patient to try to keep some tablets with her at all times just in case she is not home during the time she normally takes [place]. Nur says she is on month 3 of Epclusa and has no side effects with the medication. Explained to the patient that even if her viral load remains undetectable, it is important for her to continue taking the demedication every single day. She is aware that she must take the medication until May for a total of 6 months of treatment. We also  told the patient that she will come back 3 months after treatment is done to see if she remains undetectable without taking Epclusa, and if she does, then she is considered cured. Carnie mentioned  that she wants to donate her body to science through a facility in [place].They told her they will not take her body because she has hepatitis C. She wanted to know that if she can donate her body if her hep C is treated. I told her that if she is considered cured for hep C, she will still have antibodies that remain in her body afterwards and I'm not sure if that means she can still donate her body since she will no longer carry the active virus. Suggested that she call the facility to find out if they will take her body if she is treated for hep C.    Plan: - Continue Epclusa - Labs: HCV RNA, CBC, CMET, Hep B surface antibody - Follow-up with Marya Amsler Thursday, May 20 at [place], 4th Year PharmD Candidate

## 2019-10-27 ENCOUNTER — Other Ambulatory Visit: Payer: Self-pay

## 2019-10-27 ENCOUNTER — Ambulatory Visit (INDEPENDENT_AMBULATORY_CARE_PROVIDER_SITE_OTHER): Payer: Medicaid Other | Admitting: Podiatry

## 2019-10-27 DIAGNOSIS — M21621 Bunionette of right foot: Secondary | ICD-10-CM | POA: Diagnosis not present

## 2019-10-27 DIAGNOSIS — Z9889 Other specified postprocedural states: Secondary | ICD-10-CM

## 2019-10-27 DIAGNOSIS — M21611 Bunion of right foot: Secondary | ICD-10-CM

## 2019-10-27 MED ORDER — OXYCODONE-ACETAMINOPHEN 10-325 MG PO TABS: 1.0000 | ORAL_TABLET | Freq: Four times a day (QID) | ORAL | 0 refills | Status: AC | PRN

## 2019-10-27 MED FILL — OXYCODONE-APAP 10-325: 10-325 | 7 days supply | Qty: 30 | Fill #0

## 2019-10-29 LAB — COMPREHENSIVE METABOLIC PANEL
AG Ratio: 1 (calc) (ref 1.0–2.5)
ALT: 17 U/L (ref 6–29)
AST: 27 U/L (ref 10–35)
Albumin: 3.5 g/dL — ABNORMAL LOW (ref 3.6–5.1)
Alkaline phosphatase (APISO): 187 U/L — ABNORMAL HIGH (ref 31–125)
BUN/Creatinine Ratio: 10 (calc) (ref 6–22)
BUN: 6 mg/dL — ABNORMAL LOW (ref 7–25)
CO2: 25 mmol/L (ref 20–32)
Calcium: 9.2 mg/dL (ref 8.6–10.2)
Chloride: 108 mmol/L (ref 98–110)
Creat: 0.61 mg/dL (ref 0.50–1.10)
Globulin: 3.5 g/dL (calc) (ref 1.9–3.7)
Glucose, Bld: 122 mg/dL — ABNORMAL HIGH (ref 65–99)
Potassium: 3.9 mmol/L (ref 3.5–5.3)
Sodium: 140 mmol/L (ref 135–146)
Total Bilirubin: 0.7 mg/dL (ref 0.2–1.2)
Total Protein: 7 g/dL (ref 6.1–8.1)

## 2019-10-29 LAB — CBC
HCT: 40.8 % (ref 35.0–45.0)
Hemoglobin: 13.5 g/dL (ref 11.7–15.5)
MCH: 31.1 pg (ref 27.0–33.0)
MCHC: 33.1 g/dL (ref 32.0–36.0)
MCV: 94 fL (ref 80.0–100.0)
MPV: 11.6 fL (ref 7.5–12.5)
Platelets: 33 10*3/uL — ABNORMAL LOW (ref 140–400)
RBC: 4.34 10*6/uL (ref 3.80–5.10)
RDW: 13.6 % (ref 11.0–15.0)
WBC: 2.1 10*3/uL — ABNORMAL LOW (ref 3.8–10.8)

## 2019-10-29 LAB — HEPATITIS C RNA QUANTITATIVE
HCV Quantitative Log: <1.18 Log IU/mL
HCV RNA, PCR, QN: <15 IU/mL

## 2019-10-29 LAB — HEPATITIS B SURFACE ANTIBODY,QUALITATIVE: Hep B S Ab: REACTIVE — AB

## 2019-10-30 NOTE — Progress Notes (Signed)
   Subjective:  Patient presents today status post bunionectomy and Tailor's bunionectomy right. DOS: 09/30/2019. She states she is doing well and has improved since her last visit. She reports some continued pain with associated swelling. She admits she has been on her feet more often than she should. She has been using the CAM boot and taking Percocet for pain. Patient is here for further evaluation and treatment.   Past Medical History:  Diagnosis Date  . Anxiety   . Bipolar affective disorder ([place])    With anxiety features  . Cirrhosis of liver ([place])    Due to alcohol and hepatitis C  . Depression   . 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 ETOHism ([place])   . GERD (gastroesophageal reflux disease)   . Hematemesis 02/10/2018  . Hepatitis C 2018   hepatitis c and alcohol related hepatitis  . Heroin abuse ([place])   . History of blood transfusion    "blood doesn't clot; I fell down and had to have a transfusion"  . History of kidney stones   . Menopause 2016  . Migraine    "when I get really stressed" (09/01/2017)  . Schizophrenia ([place])   . Seizures ([place])    "when I run out of my RX; lots recently" (09/01/2017)      Objective/Physical Exam Neurovascular status intact. Minimal dehiscence noted along bunion incision site. No active bleeding noted. Heavy edema noted to the surgical extremity.  Assessment: 1. S/p bunionectomy and Tailor's bunionectomy right. DOS: 09/30/2019 2. Edema right surgical foot    Plan of Care:  1. Patient was evaluated. 2. Unna boot reapplied today. Leave clean, dry and intact for one week.  3. Continue weightbearing in CAM boot.  4. Refill prescription for Percocet 5/325 mg provided to patient one final time.  5. Return to clinic in four weeks for follow up X-Ray and transition into good sneakers.    Edrick Kins, DPM [facility]  Dr. Edrick Kins, [place]                                        [address],  [place] [address]                Office [phone]  Fax [phone]

## 2019-11-01 ENCOUNTER — Encounter (HOSPITAL_COMMUNITY): Payer: Self-pay | Admitting: Emergency Medicine

## 2019-11-01 ENCOUNTER — Emergency Department (HOSPITAL_COMMUNITY)
Admission: EM | Admit: 2019-11-01 | Discharge: 2019-11-02 | Disposition: A | Discharge status: Home / Self Care | Payer: Medicaid Other | Attending: Emergency Medicine | Admitting: Emergency Medicine

## 2019-11-01 ENCOUNTER — Emergency Department (HOSPITAL_COMMUNITY): Payer: Medicaid Other

## 2019-11-01 DIAGNOSIS — K7030 Alcoholic cirrhosis of liver without ascites: Secondary | ICD-10-CM | POA: Diagnosis not present

## 2019-11-01 DIAGNOSIS — F1010 Alcohol abuse, uncomplicated: Secondary | ICD-10-CM | POA: Diagnosis not present

## 2019-11-01 DIAGNOSIS — R1900 Intra-abdominal and pelvic swelling, mass and lump, unspecified site: Secondary | ICD-10-CM | POA: Diagnosis present

## 2019-11-01 DIAGNOSIS — D696 Thrombocytopenia, unspecified: Secondary | ICD-10-CM | POA: Insufficient documentation

## 2019-11-01 DIAGNOSIS — Z79899 Other long term (current) drug therapy: Secondary | ICD-10-CM | POA: Insufficient documentation

## 2019-11-01 LAB — COMPREHENSIVE METABOLIC PANEL
ALT: 25 U/L (ref 0–44)
AST: 37 U/L (ref 15–41)
Albumin: 3.3 g/dL — ABNORMAL LOW (ref 3.5–5.0)
Alkaline Phosphatase: 201 U/L — ABNORMAL HIGH (ref 38–126)
Anion gap: 12 (ref 5–15)
BUN: 9 mg/dL (ref 6–20)
CO2: 20 mmol/L — ABNORMAL LOW (ref 22–32)
Calcium: 9 mg/dL (ref 8.9–10.3)
Chloride: 108 mmol/L (ref 98–111)
Creatinine, Ser: 0.67 mg/dL (ref 0.44–1.00)
GFR calc Af Amer: >60 mL/min (ref >60)
GFR calc non Af Amer: >60 mL/min (ref >60)
Glucose, Bld: 92 mg/dL (ref 70–99)
Potassium: 4.1 mmol/L (ref 3.5–5.1)
Sodium: 140 mmol/L (ref 135–145)
Total Bilirubin: 0.7 mg/dL (ref 0.3–1.2)
Total Protein: 7.4 g/dL (ref 6.5–8.1)

## 2019-11-01 LAB — CBC
HCT: 41 % (ref 36.0–46.0)
Hemoglobin: 13.6 g/dL (ref 12.0–15.0)
MCH: 31.3 pg (ref 26.0–34.0)
MCHC: 33.2 g/dL (ref 30.0–36.0)
MCV: 94.3 fL (ref 80.0–100.0)
Platelets: 49 10*3/uL — ABNORMAL LOW (ref 150–400)
RBC: 4.35 MIL/uL (ref 3.87–5.11)
RDW: 14.5 % (ref 11.5–15.5)
WBC: 3.1 10*3/uL — ABNORMAL LOW (ref 4.0–10.5)
nRBC: 0 % (ref 0.0–0.2)

## 2019-11-01 LAB — URINALYSIS, ROUTINE W REFLEX MICROSCOPIC
Bilirubin Urine: NEGATIVE
Glucose, UA: NEGATIVE mg/dL
Hgb urine dipstick: NEGATIVE
Ketones, ur: NEGATIVE mg/dL
Leukocytes,Ua: NEGATIVE
Nitrite: NEGATIVE
Protein, ur: NEGATIVE mg/dL
Specific Gravity, Urine: 1.003 — ABNORMAL LOW (ref 1.005–1.030)
pH: 5 (ref 5.0–8.0)

## 2019-11-01 LAB — I-STAT BETA HCG BLOOD, ED (MC, WL, AP ONLY): I-stat hCG, quantitative: <5 m[IU]/mL (ref <5)

## 2019-11-01 LAB — LIPASE, BLOOD: Lipase: 71 U/L — ABNORMAL HIGH (ref 11–51)

## 2019-11-01 MED ORDER — SODIUM CHLORIDE 0.9% FLUSH: 3.0000 mL | Freq: Once | INTRAVENOUS | Status: DC

## 2019-11-01 NOTE — ED Triage Notes (Signed)
BIB EMS from home. Pt reports abd pain/SOB X1 day. Hx of cirrhosis and paracentesis. Pt in NAD. VSS.

## 2019-11-02 LAB — PROTIME-INR
INR: 1 (ref 0.8–1.2)
Prothrombin Time: 13.4 seconds (ref 11.4–15.2)

## 2019-11-02 LAB — CBG MONITORING, ED: Glucose-Capillary: 96 mg/dL (ref 70–99)

## 2019-11-02 MED ORDER — LIDOCAINE-EPINEPHRINE (PF) 2 %-1:200000 IJ SOLN
20.0000 mL | Freq: Once | INTRAMUSCULAR | Status: DC
  Filled 2019-11-02: qty 20

## 2019-11-02 NOTE — Discharge Instructions (Addendum)
You were seen today for concerns for abdominal distention and shortness of breath.  You do not have any evidence of ascites on ultrasound.  Your lab work is improved from prior.  You do have ongoing thrombocytopenia and slightly elevated lipase.  This is all related to your ongoing alcohol abuse.  You should stop drinking alcohol but in a monitored setting.  See resources provided.

## 2019-11-02 NOTE — ED Notes (Signed)
Consent obtained for abdominal paracentesis with local anesthetic.

## 2019-11-02 NOTE — ED Provider Notes (Signed)
[facility] EMERGENCY DEPARTMENT Provider Note   CSN: [id number] Arrival date & time: 11/01/19  2120     History Chief Complaint  Patient presents with  . Abdominal Pain  . Shortness of Breath    Briana Sweeney is a 49 y.o. female.  HPI     This is a 49 year old female with a history of cirrhosis, hepatitis C, chronic alcohol abuse, polysubstance abuse, bipolar disorder who presents with abdominal distention and shortness of breath.  Patient reports over the last several days she has had increasing abdominal distention.  She states "I think I have fluid."  She reports that she has taken several additional doses of her fluid pill with no improvement.  She states over the last day she has become more short of breath.  She denies any fever or cough.  No Covid symptoms.  She does endorse some dizziness.  She states that this is consistent when her platelets have been low.  She has been hospitalized previously for paracentesis.  She denies any overt abdominal pain but describes it more as distention and fluid overload.  Has not noted any lower extremity edema.  Patient reports that she does not follow closely with gastroenterology as an outpatient.  Patient does continue to drink.  She reports last drinking yesterday.  Chart reviewed.  She has been admitted several times for complications secondary to cirrhosis and alcohol abuse.  She has been encouraged to stop drinking as many of her medical and health problems are related to ongoing alcohol abuse.  She has previously been seen by [facility] while in the hospital.  Past Medical History:  Diagnosis Date  . Anxiety   . Bipolar affective disorder ([place])    With anxiety features  . Cirrhosis of liver ([place])    Due to alcohol and hepatitis C  . Depression   . 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 ETOHism ([place])   . GERD (gastroesophageal reflux disease)   . Hematemesis 02/10/2018  . Hepatitis C 2018   hepatitis c  and alcohol related hepatitis  . Heroin abuse ([place])   . History of blood transfusion    "blood doesn't clot; I fell down and had to have a transfusion"  . History of kidney stones   . Menopause 2016  . Migraine    "when I get really stressed" (09/01/2017)  . Schizophrenia ([place])   . Seizures ([place])    "when I run out of my RX; lots recently" (09/01/2017)    Patient Active Problem List   Diagnosis Date Noted  . History of alcohol abuse 09/07/2019  . Insomnia 05/05/2019  . Dizziness 04/02/2019  . Bruising 04/02/2019  . Hyponatremia 04/02/2019  . Head injury   . Scalp laceration, initial encounter   . Fall 01/20/2019  . Epistaxis 01/20/2019  . Altered mental status   . 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 Other mixed anxiety disorders 10/27/2018  . Neuropathy 10/27/2018  . Abscess of bursa of left elbow 09/23/2018  . Olecranon bursitis of left elbow   . Erysipelas 09/13/2018  . Acute metabolic encephalopathy 32/35/5732  . Overdose 08/22/2018  . Hypotension 08/22/2018  . Seizure ([place]) 08/22/2018  . Substance induced mood disorder ([place]) 07/17/2018  . Cocaine dependence without complication ([place]) 20/25/4270  . Polysubstance abuse ([place]) 04/08/2018  . Encephalopathy, 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[place]) 04/08/2018  . Hepatitis C 03/18/2018  . Portal hypertension ([place]) 03/18/2018  . Alcoholic intoxication without complication ([place]) 62/37/6283  . Depression 03/18/2018  .  Upper GI bleed 02/10/2018  . Alcohol use disorder, severe, in early remission ([place]) 02/10/2018  . Chronic anemia   . Thrombocytopenia due to drugs   . Suicidal ideation   . Thrombocytopenia ([place]) 01/02/2018  . GERD (gastroesophageal reflux disease) 11/01/2017  . Trichimoniasis 10/30/2017  . Alcohol dependence ([place]) 10/29/2017  . MDD (major depressive disorder), severe ([place]) 10/28/2017  . Acute hyperactive alcohol withdrawal delirium ([place]) 10/09/2017  . Schizophrenia ([place]) 10/09/2017  . Alcohol abuse with  alcohol-induced mood disorder ([place])   . Alcohol withdrawal syndrome with complication ([place]) 41/74/0814  . Esophageal varices without bleeding ([place])   . Hematemesis 09/01/2017  . Ascites due to alcoholic cirrhosis ([place])   . Decompensated hepatic cirrhosis ([place]) 07/23/2017  . Alcohol abuse 07/23/2017  . Hepatitis C antibody positive in blood 07/23/2017  . Hypokalemia 07/23/2017  . Jaundice 07/23/2017  . Coagulopathy ([place]) 07/23/2017  . Hypomagnesemia 07/23/2017  . Pancytopenia ([place]) 07/23/2017  . Alcoholic cirrhosis of liver with ascites ([place]) 07/23/2017  . Bacterial vaginosis 06/03/2017  . UTI (urinary tract infection) 06/02/2017    Past Surgical History:  Procedure Laterality Date  . ESOPHAGOGASTRODUODENOSCOPY N/A 09/03/2017   Procedure: ESOPHAGOGASTRODUODENOSCOPY (EGD);  Surgeon: Doran Stabler, MD;  Location: [facility];  Service: Gastroenterology;  Laterality: N/A;  . FINGER FRACTURE SURGERY Left    "shattered my pinky"  . FRACTURE SURGERY    . I & D EXTREMITY Left 09/18/2018   Procedure: IRRIGATION AND DEBRIDEMENT EXTREMITY;  Surgeon: Leanora Cover, MD;  Location: [facility];  Service: Orthopedics;  Laterality: Left;  . IR PARACENTESIS  07/23/2017  . IR PARACENTESIS  07/2017   "did it twice in the same week" (09/01/2017)  . SHOULDER OPEN ROTATOR CUFF REPAIR Right   . TOOTH EXTRACTION  08/2019  . TUBAL LIGATION    . VAGINAL HYSTERECTOMY       OB History    Gravida  3   Para      Term      Preterm      AB      Living  2     SAB      TAB      Ectopic      Multiple      Live Births              Family History  Problem Relation Age of Onset  . Lung cancer Mother 67  . Alcohol abuse Mother   . Throat cancer Father 64    Social History   Tobacco Use  . Smoking status: Never Smoker  . Smokeless tobacco: Never Used  Substance Use Topics  . Alcohol use: Yes    Alcohol/week: 63.0 standard drinks    Types: 63 Cans of beer per week    Comment:  weekly "I have cut back"; 4 days 4 vs 12 pack   . Drug use: Yes    Types: Marijuana    Comment: cocaiane not currently    Home Medications Prior to Admission medications   Medication Sig Start Date End Date Taking? Authorizing Provider  albuterol (VENTOLIN HFA) 108 (90 Base) MCG/ACT inhaler Inhale 2 puffs into the lungs every 6 (six) hours as needed for wheezing or shortness of breath. 09/08/19   [name], FNP  alum & mag hydroxide-simeth (MAALOX/MYLANTA) 200-200-20 MG/5ML suspension Take 15 mLs by mouth every 4 (four) hours as needed for indigestion or heartburn. 04/13/19   Hosie Poisson, MD  amLODipine (NORVASC) 5 MG tablet Take 1  tablet (5 mg total) by mouth daily. 08/26/19   [name], FNP  ARIPiprazole (ABILIFY) 5 MG tablet TAKE 1 TABLET (5 MG TOTAL) BY MOUTH DAILY. 09/13/19 11/12/19  Pucilowski, Marchia Bond, MD  cephALEXin (KEFLEX) 500 MG capsule Take 1 capsule (500 mg total) by mouth 3 (three) times daily. 09/30/19   Edrick Kins, DPM  FLUoxetine (PROZAC) 40 MG capsule Take 1 capsule (40 mg total) by mouth daily. 07/19/19   Pucilowski, Marchia Bond, MD  folic acid (FOLVITE) 1 MG tablet Take 1 tablet (1 mg total) by mouth daily. 04/13/19   Hosie Poisson, MD  gabapentin (NEURONTIN) 300 MG capsule Take 2 capsules 2 times a day. Patient taking differently: Take 600 mg by mouth 2 (two) times daily.  08/11/19   [name], FNP  hydrOXYzine (ATARAX/VISTARIL) 25 MG tablet TAKE 1 TABLET (25 MG TOTAL) BY MOUTH EVERY 6 (SIX) HOURS AS NEEDED FOR ITCHING. 08/17/19   [name], FNP  lactulose (CHRONULAC) 10 GM/15ML solution Take 30 mLs (20 g total) by mouth 3 (three) times daily. Goal to have 2-3 BMs daily Patient taking differently: Take 20 g by mouth 3 (three) times daily as needed for moderate constipation. Goal to have 2-3 BMs daily 04/13/19   Hosie Poisson, MD  levETIRAcetam (KEPPRA) 500 MG tablet Take 1 tablet (500 mg total) by mouth 2 (two) times daily. 06/15/19   [name], FNP  LORazepam (ATIVAN) 1 MG tablet TAKE 1 TABLET (1 MG TOTAL) BY MOUTH EVERY 8 (EIGHT) HOURS AS NEEDED FOR ANXIETY. 09/13/19 11/12/19  Pucilowski, Marchia Bond, MD  mirtazapine (REMERON) 15 MG tablet Take 15 mg by mouth at bedtime. 09/14/19   [provider]  mirtazapine (REMERON) 30 MG tablet Take 1 tablet (30 mg total) by mouth at bedtime. 09/16/19 11/15/19  Pucilowski, Marchia Bond, MD  Multiple Vitamin (MULTIVITAMIN WITH MINERALS) TABS tablet Take 1 tablet by mouth daily. 04/13/19   Hosie Poisson, MD  polyethylene glycol (MIRALAX / GLYCOLAX) 17 g packet Take 17 g by mouth 2 (two) times daily. Patient taking differently: Take 17 g by mouth daily as needed for mild constipation.  04/13/19   Hosie Poisson, MD  Sofosbuvir-Velpatasvir (EPCLUSA) 400-100 MG TABS Take 1 tablet by mouth daily. Take 1 tablet by mouth daily. 07/12/19   [place], FNP  sulfamethoxazole-trimethoprim (BACTRIM DS) 800-160 MG tablet Take 1 tablet by mouth 2 (two) times daily. 08/13/19   [name], FNP  thiamine 100 MG tablet Take 1 tablet (100 mg total) by mouth daily. 04/14/19   Hosie Poisson, MD  topiramate (TOPAMAX) 50 MG tablet Take 1 tablet (50 mg total) by mouth daily. 04/13/19   Hosie Poisson, MD    Allergies    Patient has no known allergies.  Review of Systems   Review of Systems  Constitutional: Negative for fever.  Respiratory: Positive for shortness of breath. Negative for cough.   Cardiovascular: Negative for chest pain and leg swelling.  Gastrointestinal: Positive for abdominal distention. Negative for abdominal pain, blood in stool, diarrhea, nausea and vomiting.  Genitourinary: Negative for dysuria.  All other systems reviewed and are negative.   Physical Exam Updated Vital Signs BP 99/66   Pulse 88   Temp 98.6 F (37 C) (Oral)   Resp 14   Ht 1.524 m (5')   Wt 68 kg   SpO2 94%   BMI 29.29 kg/m   Physical Exam Vitals and nursing note reviewed.  Constitutional:  Appearance: She is  well-developed.  HENT:     Head: Normocephalic and atraumatic.  Eyes:     Pupils: Pupils are equal, round, and reactive to light.  Cardiovascular:     Rate and Rhythm: Normal rate and regular rhythm.     Heart sounds: Normal heart sounds.  Pulmonary:     Effort: Pulmonary effort is normal. No respiratory distress.     Breath sounds: No wheezing.  Abdominal:     General: Bowel sounds are normal.     Palpations: Abdomen is soft. There is no fluid wave.     Tenderness: There is no abdominal tenderness.     Comments: Abdomen soft, nontender, no significant distention noted  Musculoskeletal:     Cervical back: Neck supple.  Skin:    General: Skin is warm and dry.     Comments: Hyperpigmented  Neurological:     Mental Status: She is alert and oriented to person, place, and time.  Psychiatric:        Mood and Affect: Mood normal.     ED Results / Procedures / Treatments   Labs (all labs ordered are listed, but only abnormal results are displayed) Labs Reviewed  LIPASE, BLOOD - Abnormal; Notable for the following components:      Result Value   Lipase 71 (*)    All other components within normal limits  COMPREHENSIVE METABOLIC PANEL - Abnormal; Notable for the following components:   CO2 20 (*)    Albumin 3.3 (*)    Alkaline Phosphatase 201 (*)    All other components within normal limits  CBC - Abnormal; Notable for the following components:   WBC 3.1 (*)    Platelets 49 (*)    All other components within normal limits  URINALYSIS, ROUTINE W REFLEX MICROSCOPIC - Abnormal; Notable for the following components:   Color, Urine COLORLESS (*)    Specific Gravity, Urine 1.003 (*)    All other components within normal limits  BODY FLUID CULTURE  GRAM STAIN  PROTIME-INR  SYNOVIAL CELL COUNT + DIFF, W/ CRYSTALS  GLUCOSE, BODY FLUID OTHER  I-STAT BETA HCG BLOOD, ED ([facility], [facility], [facility] ONLY)  CBG MONITORING, ED    EKG EKG Interpretation  Date/Time:  Monday November 01 2019 21:22:27 EST Ventricular Rate:  105 PR Interval:  156 QRS Duration: 74 QT Interval:  340 QTC Calculation: 449 R Axis:   83 Text Interpretation: Sinus tachycardia Possible Left atrial enlargement Low voltage QRS Septal infarct , age undetermined Abnormal ECG Confirmed by Thayer Jew [phone]) on 11/02/2019 12:22:41 AM   Radiology DG Chest 2 View  Result Date: 11/01/2019 CLINICAL DATA:  Shortness of breath EXAM: CHEST - 2 VIEW COMPARISON:  07/03/2019 FINDINGS: Mildly low lung volumes. No consolidation or effusion. Normal heart size. No pneumothorax. Calcified hilar nodes consistent with prior granulomatous disease. IMPRESSION: No active cardiopulmonary disease. Electronically Signed   By: Donavan Foil M.D.   On: 11/01/2019 21:52    Procedures Procedures (including critical care time)  Medications Ordered in ED Medications  sodium chloride flush (NS) 0.9 % injection 3 mL (3 mLs Intravenous Not Given 11/02/19 0152)  lidocaine-EPINEPHrine (XYLOCAINE W/EPI) 2 %-1:200000 (PF) injection 20 mL (20 mLs Infiltration Given 11/02/19 0216)    ED Course  I have reviewed the triage vital signs and the nursing notes.  Pertinent labs & imaging results that were available during my care of the patient were reviewed by me and considered in my medical decision making (see  chart for details).    MDM Rules/Calculators/A&P                       Patient presents with concerns for abdominal distention and shortness of breath.  She is chronically ill-appearing but nontoxic.  Vital signs are reassuring.  He is not significantly icteric or jaundiced today.  Her abdomen is soft without obvious distention.  Her lab work was reviewed from triage.  Lipase elevated at 71.  This is similar to prior elevations.  Alk phos 201.  Platelets 49.  Suspect this is due to ongoing alcohol abuse.  I did do an ultrasound of the bedside to evaluate for possible ascites.  She does not have a drainable pocket.  Given that  she is relatively nontender without any discernible distention, have low suspicion for SBP at this time.  She does not appear overtly volume overloaded and she is not hypoxic.  patient was encouraged to follow-up with gastroenterology as an outpatient.  It does not appear that she is followed up as scheduled.  Additionally, she was encouraged to stop drinking.  She was provided resources for cessation.  After history, exam, and medical workup I feel the patient has been appropriately medically screened and is safe for discharge home. Pertinent diagnoses were discussed with the patient. Patient was given return precautions.   Final Clinical Impression(s) / ED Diagnoses Final diagnoses:  Chronic alcohol abuse  Alcoholic cirrhosis of liver without ascites ([place])  Thrombocytopenia ([place])    Rx / DC Orders ED Discharge Orders    None       Carleta Woodrow, Barbette Hair, MD 11/02/19 [phone]

## 2019-11-02 NOTE — ED Notes (Signed)
Abdominal Paracentesis discontinued per Dr. Dina Rich.

## 2019-11-02 NOTE — ED Notes (Signed)
Discharge instructions discussed with pt. Pt verbalized understanding with no questions at this time.  

## 2019-11-03 ENCOUNTER — Other Ambulatory Visit: Payer: Self-pay | Admitting: Podiatry

## 2019-11-04 ENCOUNTER — Telehealth: Payer: Self-pay | Admitting: Podiatry

## 2019-11-04 NOTE — Telephone Encounter (Addendum)
I spoke to pt and she states she is having pain in the side of the right foot and big toe, the areas throbs, and she is taking ibuprofen.

## 2019-11-04 NOTE — Telephone Encounter (Signed)
Pt called following up on refill request for pain medication.

## 2019-11-05 NOTE — Telephone Encounter (Signed)
Pt called to f/u on pain med refill

## 2019-11-05 NOTE — Telephone Encounter (Signed)
Pt is calling to follow up on refill request for pain medication

## 2019-11-08 ENCOUNTER — Telehealth: Payer: Self-pay | Admitting: Podiatry

## 2019-11-08 ENCOUNTER — Other Ambulatory Visit: Payer: Self-pay | Admitting: Podiatry

## 2019-11-08 DIAGNOSIS — M21621 Bunionette of right foot: Secondary | ICD-10-CM

## 2019-11-08 DIAGNOSIS — Z9889 Other specified postprocedural states: Secondary | ICD-10-CM

## 2019-11-08 HISTORY — DX: Bunionette of right foot: M21.621

## 2019-11-08 HISTORY — DX: Other specified postprocedural states: Z98.890

## 2019-11-08 NOTE — Telephone Encounter (Signed)
Pt. Said she has been calling for a medication refill, and still has not been filled. She said that shes in pain.

## 2019-11-08 NOTE — Telephone Encounter (Signed)
This message answered in 11/08/2019 12:24pm Telephone Call.

## 2019-11-08 NOTE — Progress Notes (Signed)
Tried to send pain medication to the patients pharmacy but the system is down. I left a VM to let the patient know and I will continue to work on this for her.

## 2019-11-08 NOTE — Telephone Encounter (Signed)
Pt called and has been trying since Thursday to get pain medication refill.Please call pt to advise.

## 2019-11-08 NOTE — Telephone Encounter (Signed)
Pt states she has throbbing pain and has taken the cast off as instructed and elevating, takes percocet 5/325mg  1 or 2 depending on the pain every 6 hours as needed, no redness.

## 2019-11-09 ENCOUNTER — Other Ambulatory Visit: Payer: Self-pay

## 2019-11-09 ENCOUNTER — Ambulatory Visit (INDEPENDENT_AMBULATORY_CARE_PROVIDER_SITE_OTHER): Payer: Medicaid Other | Admitting: Neurology

## 2019-11-09 ENCOUNTER — Other Ambulatory Visit: Payer: Self-pay | Admitting: Podiatry

## 2019-11-09 ENCOUNTER — Encounter: Payer: Self-pay | Admitting: Neurology

## 2019-11-09 ENCOUNTER — Other Ambulatory Visit (HOSPITAL_COMMUNITY): Payer: Self-pay | Admitting: Psychiatry

## 2019-11-09 VITALS — BP 150/103 | HR 88 | Ht 61.0 in | Wt 147.0 lb

## 2019-11-09 DIAGNOSIS — G40309 Generalized idiopathic epilepsy and epileptic syndromes, not intractable, without status epilepticus: Secondary | ICD-10-CM

## 2019-11-09 MED ORDER — OXYCODONE-ACETAMINOPHEN 5-325 MG PO TABS: 1.0000 | ORAL_TABLET | Freq: Four times a day (QID) | ORAL | 0 refills | Status: DC | PRN

## 2019-11-09 MED ORDER — LEVETIRACETAM 500 MG PO TABS: 500.0000 mg | ORAL_TABLET | Freq: Two times a day (BID) | ORAL | 3 refills | Status: DC

## 2019-11-09 MED FILL — levETIRAcetam 500 MG TABS: 500 | 90 days supply | Qty: 180 | Fill #0

## 2019-11-09 MED FILL — OXYCODONE-ACETAMINOPHEN 5-3: 5-325 | 3 days supply | Qty: 10 | Fill #0

## 2019-11-09 MED FILL — ARIPIPRAZOLE 5 MG TABS: 5 | 30 days supply | Qty: 30 | Fill #0

## 2019-11-09 NOTE — Progress Notes (Signed)
NEUROLOGY CONSULTATION NOTE  Sawyer Hargadon MRN: [id number] DOB: 22-Dec-1970  Referring provider: Kathe Becton, FNP Primary care provider: Kathe Becton, FNP  Reason for consult:  seizures  Thank you for your kind referral of Briana Sweeney for consultation of the above symptoms. Although her history is well known to you, please allow me to reiterate it for the purpose of our medical record. She is alone in the office today. Records and images were personally reviewed where available.   HISTORY OF PRESENT ILLNESS: This is a pleasant 49 year old right-handed woman with a history of cirrhosis secondary to alcohol abuse and hepatitis C, schizophrenia, anxiety, presenting for evaluation of seizures. She has not seen a neurologist in many years, her last neurologist was in [place]. Seizures started around age 49 or 49 where she would have "silent seizures." She would start feeling nervous with hot/cold flashes. Her eyes would just go blind and dilated after, she would feel weird for 2 days. She has had a couple of convulsions in the apst 5 years, last was in July 2020. She lives alone and friends have mentioned staring spells. She has occasional twitching and may drop something in her hand. She used to have nocturnal seizures and wake up with a bloody pillow. She had been on Phenytoin for many years but continued to have seizures once a month. Phenytoin would interact with hepatitis C medication, so she was switched to Levetiracetam 500mg  BID last October  2020. She denies any seizures since starting LEV. She denies any olfactory/gustatory hallucinations, rising epigastric sensation, focal numbness/tingling/weakness. She used to have headaches but these have decreased. She gets dizzy when her platelet count goes low. She has occasional neck/back pain, occasional numbness in both hands. No diplopia, dysarthria/dysphagia, bowel/bladder dysfunction. Her right foot is in a boot s/p bunion removal. 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 She recalls a fall 4 months ago when her platelets went down and she fell forward, hitting her face. She states she quit drinking 4 months ago. Of note, there is an ER visit on 11/01/19 where she reported last drink was day prior. She states her memory is "half/half." Sleep is good.   Epilepsy Risk Factors:  She had febrile seizures in childhood. Otherwise she had a normal birth and early development.  There is no history of CNS infections such as meningitis/encephalitis, significant traumatic brain injury, neurosurgical procedures, or family history of seizures.  Diagnostic Data: MRI brain without contrast done 03/2019 no acute changes, there was brain atrophy, premature for age, mild T1 hyperintensity in the basal ganglia likely related to chronic alcohol use.  Prior AEDs: Phenytoin Laboratory Data:  Lab Results  Component Value Date   WBC 3.1 (L) 11/01/2019   HGB 13.6 11/01/2019   HCT 41.0 11/01/2019   MCV 94.3 11/01/2019   PLT 49 (L) 11/01/2019     Chemistry      Component Value Date/Time   NA 140 11/01/2019 2142   NA 139 05/05/2019 0908   K 4.1 11/01/2019 2142   CL 108 11/01/2019 2142   CO2 20 (L) 11/01/2019 2142   BUN 9 11/01/2019 2142   BUN 9 05/05/2019 0908   CREATININE 0.67 11/01/2019 2142   CREATININE 0.61 10/25/2019 0913      Component Value Date/Time   CALCIUM 9.0 11/01/2019 2142   ALKPHOS 201 (H) 11/01/2019 2142   AST 37 11/01/2019 2142   ALT  25 11/01/2019 2142   BILITOT 0.7 11/01/2019 2142   BILITOT 1.1 05/05/2019 0908      PAST MEDICAL HISTORY: Past Medical History:  Diagnosis Date  . Anxiety   . Bipolar affective disorder ([place])    With anxiety features  . Cirrhosis of liver ([place])    Due to alcohol and hepatitis C  . Depression   . 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 ETOHism ([place])   . GERD (gastroesophageal reflux disease)   . Hematemesis 02/10/2018  .  Hepatitis C 2018   hepatitis c and alcohol related hepatitis  . Heroin abuse ([place])   . History of blood transfusion    "blood doesn't clot; I fell down and had to have a transfusion"  . History of kidney stones   . Menopause 2016  . Migraine    "when I get really stressed" (09/01/2017)  . Schizophrenia ([place])   . Seizures ([place])    "when I run out of my RX; lots recently" (09/01/2017)    PAST SURGICAL HISTORY: Past Surgical History:  Procedure Laterality Date  . ESOPHAGOGASTRODUODENOSCOPY N/A 09/03/2017   Procedure: ESOPHAGOGASTRODUODENOSCOPY (EGD);  Surgeon: Doran Stabler, MD;  Location: [facility];  Service: Gastroenterology;  Laterality: N/A;  . FINGER FRACTURE SURGERY Left    "shattered my pinky"  . FRACTURE SURGERY    . I & D EXTREMITY Left 09/18/2018   Procedure: IRRIGATION AND DEBRIDEMENT EXTREMITY;  Surgeon: Leanora Cover, MD;  Location: [facility];  Service: Orthopedics;  Laterality: Left;  . IR PARACENTESIS  07/23/2017  . IR PARACENTESIS  07/2017   "did it twice in the same week" (09/01/2017)  . SHOULDER OPEN ROTATOR CUFF REPAIR Right   . TOOTH EXTRACTION  08/2019  . TUBAL LIGATION    . VAGINAL HYSTERECTOMY      MEDICATIONS: Current Outpatient Medications on File Prior to Visit  Medication Sig Dispense Refill  . albuterol (VENTOLIN HFA) 108 (90 Base) MCG/ACT inhaler Inhale 2 puffs into the lungs every 6 (six) hours as needed for wheezing or shortness of breath. 8 g 11  . alum & mag hydroxide-simeth (MAALOX/MYLANTA) 200-200-20 MG/5ML suspension Take 15 mLs by mouth every 4 (four) hours as needed for indigestion or heartburn. 355 mL 0  . amLODipine (NORVASC) 5 MG tablet Take 1 tablet (5 mg total) by mouth daily. 30 tablet 3  . cephALEXin (KEFLEX) 500 MG capsule Take 1 capsule (500 mg total) by mouth 3 (three) times daily. (Patient taking differently: Take 500 mg by mouth 2 (two) times daily. ) 30 capsule 0  . FLUoxetine (PROZAC) 40 MG capsule Take 1 capsule (40 mg  total) by mouth daily. 30 capsule 3  . folic acid (FOLVITE) 1 MG tablet Take 1 tablet (1 mg total) by mouth daily. 30 tablet 3  . gabapentin (NEURONTIN) 300 MG capsule Take 2 capsules 2 times a day. (Patient taking differently: Take 600 mg by mouth 2 (two) times daily. ) 120 capsule 3  . hydrOXYzine (ATARAX/VISTARIL) 25 MG tablet TAKE 1 TABLET (25 MG TOTAL) BY MOUTH EVERY 6 (SIX) HOURS AS NEEDED FOR ITCHING. 90 tablet 1  . lactulose (CHRONULAC) 10 GM/15ML solution Take 30 mLs (20 g total) by mouth 3 (three) times daily. Goal to have 2-3 BMs daily (Patient taking differently: Take 20 g by mouth 3 (three) times daily as needed for moderate constipation. Goal to have 2-3 BMs daily) 240 mL 3  . levETIRAcetam (KEPPRA) 500 MG tablet Take 1 tablet (500 mg  total) by mouth 2 (two) times daily. 60 tablet 3  . LORazepam (ATIVAN) 1 MG tablet TAKE 1 TABLET (1 MG TOTAL) BY MOUTH EVERY 8 (EIGHT) HOURS AS NEEDED FOR ANXIETY. 60 tablet 1  . mirtazapine (REMERON) 30 MG tablet Take 1 tablet (30 mg total) by mouth at bedtime. 30 tablet 1  . Multiple Vitamin (MULTIVITAMIN WITH MINERALS) TABS tablet Take 1 tablet by mouth daily.    . polyethylene glycol (MIRALAX / GLYCOLAX) 17 g packet Take 17 g by mouth 2 (two) times daily. (Patient taking differently: Take 17 g by mouth daily as needed for mild constipation. ) 14 each 0  . Sofosbuvir-Velpatasvir (EPCLUSA) 400-100 MG TABS Take 1 tablet by mouth daily. Take 1 tablet by mouth daily. 28 tablet 5  . sulfamethoxazole-trimethoprim (BACTRIM DS) 800-160 MG tablet Take 1 tablet by mouth 2 (two) times daily. 14 tablet 0  . thiamine 100 MG tablet Take 1 tablet (100 mg total) by mouth daily. 30 tablet 0   No current facility-administered medications on file prior to visit.    ALLERGIES: No Known Allergies  FAMILY HISTORY: Family History  Problem Relation Age of Onset  . Lung cancer Mother 75  . Alcohol abuse Mother   . Throat cancer Father 64    SOCIAL HISTORY: Social  History   Socioeconomic History  . Marital status: Divorced    Spouse name: Not on file  . Number of children: 2  . Years of education: Not on file  . Highest education level: Not on file  Occupational History  . Occupation: applying for disability  Tobacco Use  . Smoking status: Never Smoker  . Smokeless tobacco: Never Used  Substance and Sexual Activity  . Alcohol use: Yes    Alcohol/week: 63.0 standard drinks    Types: 63 Cans of beer per week    Comment: weekly "I have cut back"; 4 days 4 vs 12 pack   . Drug use: Yes    Types: Marijuana    Comment: cocaiane not currently  . Sexual activity: Yes  Other Topics Concern  . Not on file  Social History Narrative   She moved with a boyfriend to [place] and was [facility].  He died of a massive heart attack in 8/18, per her report, and so she moved back to [place] and is living with a friend.      Right handed   2 story home    Lives alone 11/09/19   Social Determinants of Health   Financial Resource Strain:   . Difficulty of Paying Living Expenses: Not on file  Food Insecurity:   . Worried About Charity fundraiser in the Last Year: Not on file  . Ran Out of Food in the Last Year: Not on file  Transportation Needs:   . Lack of Transportation (Medical): Not on file  . Lack of Transportation (Non-Medical): Not on file  Physical Activity:   . Days of Exercise per Week: Not on file  . Minutes of Exercise per Session: Not on file  Stress:   . Feeling of Stress : Not on file  Social Connections:   . Frequency of Communication with Friends and Family: Not on file  . Frequency of Social Gatherings with Friends and Family: Not on file  . Attends Religious Services: Not on file  . Active Member of Clubs or Organizations: Not on file  . Attends Archivist Meetings: Not on file  . Marital Status:  Not on file  Intimate Partner Violence:   . Fear of Current or Ex-Partner: Not on file  .  Emotionally Abused: Not on file  . Physically Abused: Not on file  . Sexually Abused: Not on file    REVIEW OF SYSTEMS: Constitutional: No fevers, chills, or sweats, no generalized fatigue, change in appetite Eyes: No visual changes, double vision, eye pain Ear, nose and throat: No hearing loss, ear pain, nasal congestion, sore throat Cardiovascular: No chest pain, palpitations Respiratory:  No shortness of breath at rest or with exertion, wheezes GastrointestinaI: No nausea, vomiting, diarrhea, abdominal pain, fecal incontinence Genitourinary:  No dysuria, urinary retention or frequency Musculoskeletal:  No neck pain, back pain Integumentary: No rash, pruritus, skin lesions Neurological: as above Psychiatric: No depression, insomnia, anxiety Endocrine: No palpitations, fatigue, diaphoresis, mood swings, change in appetite, change in weight, increased thirst Hematologic/Lymphatic:  No anemia, purpura, petechiae. Allergic/Immunologic: no itchy/runny eyes, nasal congestion, recent allergic reactions, rashes  PHYSICAL EXAM: Vitals:   11/09/19 1024  BP: (!) 150/103  Pulse: 88  SpO2: 99%   General: No acute distress, flat affect Head:  Normocephalic/atraumatic Skin/Extremities: No rash, no edema Neurological Exam: Mental status: alert and oriented to person, place, and time, no dysarthria or aphasia, Fund of knowledge is appropriate.  Recent and remote memory are impaired. 1/3 delayed recall. Attention and concentration are normal.    Able to name objects and repeat phrases. Cranial nerves: CN I: not tested CN II: pupils equal, round and reactive to light, visual fields intact CN III, IV, VI:  full range of motion, no nystagmus, no ptosis CN V: facial sensation intact CN VII: upper and lower face symmetric CN VIII: hearing intact to conversation Bulk & Tone: normal, no fasciculations. Motor: 5/5 throughout with no pronator drift, right foot in boot Sensation: intact to light  touch, cold, pin Deep Tendon Reflexes: +2 throughout Cerebellar: no incoordination on finger to nose testing Gait: slow and cautious with right foot in boot, no ataxia Tremor: none  IMPRESSION: This is a 49 year old right-handed woman with a history of cirrhosis secondary to alcohol abuse and hepatitis C, schizophrenia, anxiety, presenting for evaluation of seizures. 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 She continued to have seizures once a month on Phenytoin, now off Phenytoin in order to take hepatitis C medication. She has been on Levetiracetam 500mg  BID since October 2020 with no seizures, no side effects. A 1-hour EEG will be ordered to classify seizures. Refills sent for LEV. We discussed avoidance of seizure triggers, including alcohol use.  [place] driving laws were discussed with the patient, and she knows to stop driving after a seizure, until 6 months seizure-free. Follow-up in 6 months, she knows to call for any changes.   Thank you for allowing me to participate in the care of this patient. Please do not hesitate to call for any questions or concerns.   Ellouise Newer, M.D.  CC: Kathe Becton, FNP

## 2019-11-09 NOTE — Telephone Encounter (Signed)
sent 

## 2019-11-09 NOTE — Patient Instructions (Signed)
Great meeting you! We will schedule a 1-hour EEG. Continue Keppra (Levetiracetam) 500mg  twice a day, refills have been sent. Follow-up in 6 months,call for any changes.   Seizure Precautions: 1. If medication has been prescribed for you to prevent seizures, take it exactly as directed.  Do not stop taking the medicine without talking to your doctor first, even if you have not had a seizure in a long time.   2. Avoid activities in which a seizure would cause danger to yourself or to others.  Don't operate dangerous machinery, swim alone, or climb in high or dangerous places, such as on ladders, roofs, or girders.  Do not drive unless your doctor says you may.  3. If you have any warning that you may have a seizure, lay down in a safe place where you can't hurt yourself.    4.  No driving for 6 months from last seizure, as per [facility].   Please refer to the following link on the [facility] website for more information: http://[email or web address]/[email or web address]/[email or web address]   5.  Maintain good sleep hygiene. Avoid alcohol.  6.  Contact your doctor if you have any problems that may be related to the medicine you are taking.  7.  Call 911 and bring the patient back to the ED if:        A.  The seizure lasts longer than 5 minutes.       B.  The patient doesn't awaken shortly after the seizure  C.  The patient has new problems such as difficulty seeing, speaking or moving  D.  The patient was injured during the seizure  E.  The patient has a temperature over 102 F (39C)  F.  The patient vomited and now is having trouble breathing

## 2019-11-10 ENCOUNTER — Telehealth: Payer: Self-pay | Admitting: Pharmacy Technician

## 2019-11-10 NOTE — Telephone Encounter (Signed)
[facility] Patient Advocate Encounter   Received notification from [place] that prior authorization for [place] is required.   PA submitted on 11/10/2019 Key [id number] W Status is pending    [facility] will continue to follow.   Briana Sweeney. Briana Sweeney [place] Patient [facility] for Infectious Disease Phone: [phone] Fax:  [phone]

## 2019-11-12 ENCOUNTER — Ambulatory Visit: Payer: Self-pay | Admitting: Family Medicine

## 2019-11-12 NOTE — Telephone Encounter (Signed)
[facility] Patient Advocate Encounter   Received notification from [place] that prior authorization for Epclusa generic is required.   PA submitted on 11/12/2019 Key [id number] W Status is pending    [facility] will continue to follow. The previous prior authorization was denied because it was submitted as brand rather than generic. [place] [facility] has updated their portal to include generic under pharmacy claims. Will check on the new status and let [place] know when it is approved.   Bartholomew Crews, CPhT Specialty Pharmacy Patient [facility] for Infectious Disease Phone: [phone] Fax: [phone] 11/12/2019 12:55 PM

## 2019-11-13 ENCOUNTER — Encounter: Payer: Self-pay | Admitting: Family Medicine

## 2019-11-14 NOTE — Progress Notes (Signed)
  Subjective:  Patient ID: Briana Sweeney, female    DOB: [birth year],  MRN: [id number]  Chief Complaint  Patient presents with  . Post-op Problem     incision has opened up// per val POV# DOS 09/30/2019 DOUBLE OSTEOTOMY AND METATARSAL OSTEOTOMY 5TH RT    DOS: 09/30/19 Procedure: Double osteotomy, 5th metatarsal osteotomy right   49 y.o. female presents with the above complaint. History confirmed with patient.  States that the incision opened up and has been bleeding draining and causing a lot of pain.  States that she has been walking on it despite being advised to limit activity  Objective:  Physical Exam: tenderness at the surgical site, local edema noted and calf supple, nontender. Incision: slight clear drainage present, dehiscence present proximal half of the incision. Bruising, contusion noted. No signs of infection noted.    Assessment:   1. Bunion, right   2. Post-operative state     Plan:  Patient was evaluated and treated and all questions answered.  Post-operative State -Wound cleansed, compression dressing applied to reduce edema. -No signs of acute infection noted -Follow-up with Dr. Amalia Hailey as scheduled   No follow-ups on file.

## 2019-11-15 ENCOUNTER — Emergency Department (HOSPITAL_COMMUNITY)
Admission: EM | Admit: 2019-11-15 | Discharge: 2019-11-15 | Discharge status: Against Medical Advice | Payer: Medicaid Other | Attending: Emergency Medicine | Admitting: Emergency Medicine

## 2019-11-15 ENCOUNTER — Ambulatory Visit (INDEPENDENT_AMBULATORY_CARE_PROVIDER_SITE_OTHER): Payer: Medicaid Other | Admitting: Psychiatry

## 2019-11-15 ENCOUNTER — Other Ambulatory Visit: Payer: Medicaid Other

## 2019-11-15 ENCOUNTER — Other Ambulatory Visit: Payer: Self-pay

## 2019-11-15 ENCOUNTER — Ambulatory Visit: Payer: Medicaid Other

## 2019-11-15 DIAGNOSIS — M79671 Pain in right foot: Secondary | ICD-10-CM | POA: Insufficient documentation

## 2019-11-15 DIAGNOSIS — F419 Anxiety disorder, unspecified: Secondary | ICD-10-CM

## 2019-11-15 DIAGNOSIS — F1910 Other psychoactive substance abuse, uncomplicated: Secondary | ICD-10-CM

## 2019-11-15 DIAGNOSIS — Z5321 Procedure and treatment not carried out due to patient leaving prior to being seen by health care provider: Secondary | ICD-10-CM | POA: Diagnosis not present

## 2019-11-15 DIAGNOSIS — F1994 Other psychoactive substance use, unspecified with psychoactive substance-induced mood disorder: Secondary | ICD-10-CM

## 2019-11-15 DIAGNOSIS — F413 Other mixed anxiety disorders: Secondary | ICD-10-CM

## 2019-11-15 MED ORDER — CLONAZEPAM 1 MG PO TABS: 1.0000 mg | ORAL_TABLET | Freq: Two times a day (BID) | ORAL | 1 refills | Status: DC | PRN

## 2019-11-15 MED ORDER — FLUOXETINE HCL 40 MG PO CAPS: 40.0000 mg | ORAL_CAPSULE | Freq: Every day | ORAL | 3 refills | Status: DC

## 2019-11-15 MED ORDER — MIRTAZAPINE 30 MG PO TABS: 30.0000 mg | ORAL_TABLET | Freq: Every day | ORAL | 1 refills | Status: DC

## 2019-11-15 MED FILL — FLUoxetine HCL 40 MG CAPS: 40 | 30 days supply | Qty: 30 | Fill #0

## 2019-11-15 MED FILL — clonazePAM 1 MG TABS: 1 | 30 days supply | Qty: 60 | Fill #0

## 2019-11-15 MED FILL — SOFOSBUVIR-VELPATASVIR 400-: 400-100 | 28 days supply | Qty: 28 | Fill #4

## 2019-11-15 NOTE — ED Notes (Addendum)
Pt pulled "code blue" lever in room and this writer went to reset the alarm.  Upon entering room, pt was agitated and advised she didn't have her call light so she didn't have any other way to get staff attention.  Pt stated she needs to use the restroom and since I didn't know anything about the patient, I offered to assist her after speaking with her RN/NT.  Pt became increasingly agitated and began yelling at this writer stating she was going to urinate in the floor. RN Cari came to room and offered to get pt a wheelchair since she was stating she couldn't walk.  Pt began removing her pants but stopped once she understood the nurse was going to get her a wheelchair.  The pt was waiting for wheelchair and accompanied by NT when this writer left the pt.

## 2019-11-15 NOTE — ED Notes (Addendum)
Pt visualized by ED staff exiting ED through ambulance bay, steady gait, no assistance. EDP made aware.

## 2019-11-15 NOTE — ED Triage Notes (Signed)
Pt reports to the ED with complaint of right foot pain. Patient reports she had a bunion removal on the right foot and was in a cast x1 month. Patient states her doctor told her to take off the boot and cast and walk on it. Patient reports increased swelling to foot.  Patient screaming and ranting in triage

## 2019-11-15 NOTE — ED Notes (Signed)
This RN called to room by NT and code blue light flashing from room.  This RN entered room to find pt screaming at staff that she has to use the bathroom. Pt began pulling down pants while standing, stating that "Im just going to piss on the floor."  This RN informed pt that there was a bathroom nearby and staff present able to help her there.  Pt began screaming "I cant stand up, I have screws in my foot" This RN explained we have safe pt handling equipment to assist pt, RN left and returned with [place] for pt.  Pt refused to use Clarise Cruz, stating "Im not standing on that, I cant stand, my orthopedist would make me use a wheelchair."  This RN attempted to explain how safety device works. Pt began screaming profanity at staff, demanding we leave her room.  This RN and NT left room.  This RN informed Agricultural consultant of pt behavior.

## 2019-11-15 NOTE — Progress Notes (Signed)
BH MD/PA/NP OP Progress Note  11/15/2019 2:43 PM Briana Sweeney  MRN:  [id number] Interview was conducted by phone and I verified that I was speaking with the correct person using two identifiers. I discussed the limitations of evaluation and management by telemedicine and  the availability of in person appointments. Patient expressed understanding and agreed to proceed.  Chief Complaint: "I am very stressed out".  HPI: 49 yo divorced female with a long hx of alcohol use disorder, polysubstance abuse (cocaine, cannabis, heroin, benzodiaepines), and past diagnoses of major depressive disorder vs substance-induced mood disorder vs bipolar disorder. Diagnosis of schizophrenia is also mentioned in the record but patient is unable to provide any clear history of psychotic episodes unrelated to her drug/alcohol use. She was first treated for mental problems at age 71 when she became depressed and OD on meds..She has been taking fluoxetine (up to 60 mg) for over 20 years per her report. She also tried buspirone and hydroxyzine for anxiety with no clear benefit. Recently prescribed trazodone for insomnia does not appear to wok either. She has never been on any mood stabilizers except for lithium or antipsychotics apart from haloperidol. She did not like how she felt on either of those. She started abusing alcohol at age of 71. At one time she was drinking a 1/2 gallon of vodka plus some beer on a daily basis. She has a hx of alcohol withdrawal delirium. She has some periods of sobriety lasting few months at a time but cannot provide more details. 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 She minimizes use of illicit drugs claiming that she only uses them when "partying".We have added mirtazapine and she reports that sleep has improved. She continues to complain primarily of severe anxiety both generalized and panic type. She has no support, struggles financially. Briana Sweeney reports that she has not  been drinking alcohol since early September 2020. She no longer is taking lactulose or topiramate. She has not been taking buspirone and finds lorazepam not as effective for anxiety anymore. In the past she was on clonazepam and would like to try it again. Instead of lorazepam.  Visit Diagnosis:    ICD-10-CM   1. Other mixed anxiety disorders  F41.3   2. Anxiety  F41.9 FLUoxetine (PROZAC) 40 MG capsule  3. Substance induced mood disorder (HCC)  F19.94   4. Polysubstance abuse ([place])  F19.10     Past Psychiatric History: Please see intake H&P.  Past Medical History:  Past Medical History:  Diagnosis Date  . Alcohol use   . Anxiety   . Bipolar affective disorder ([place])    With anxiety features  . Cirrhosis of liver ([place])    Due to alcohol and hepatitis C  . Depression   . 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 ETOHism ([place])   . GERD (gastroesophageal reflux disease)   . Hematemesis 02/10/2018  . Hepatitis C 2018   hepatitis c and alcohol related hepatitis  . Heroin abuse ([place])   . History of blood transfusion    "blood doesn't clot; I fell down and had to have a transfusion"  . History of kidney stones   . Menopause 2016  . Migraine    "when I get really stressed" (09/01/2017)  . Schizophrenia ([place])   . Seizures ([place])    "when I run out of my RX; lots recently" (09/01/2017)    Past Surgical History:  Procedure Laterality Date  . ESOPHAGOGASTRODUODENOSCOPY N/A 09/03/2017  Procedure: ESOPHAGOGASTRODUODENOSCOPY (EGD);  Surgeon: Doran Stabler, MD;  Location: [facility];  Service: Gastroenterology;  Laterality: N/A;  . FINGER FRACTURE SURGERY Left    "shattered my pinky"  . FRACTURE SURGERY    . I & D EXTREMITY Left 09/18/2018   Procedure: IRRIGATION AND DEBRIDEMENT EXTREMITY;  Surgeon: Leanora Cover, MD;  Location: [facility];  Service: Orthopedics;  Laterality: Left;  . IR PARACENTESIS  07/23/2017  . IR PARACENTESIS  07/2017   "did it twice in the same week"  (09/01/2017)  . SHOULDER OPEN ROTATOR CUFF REPAIR Right   . TOOTH EXTRACTION  08/2019  . TUBAL LIGATION    . VAGINAL HYSTERECTOMY      Family Psychiatric History: Reviewed.  Family History:  Family History  Problem Relation Age of Onset  . Lung cancer Mother 63  . Alcohol abuse Mother   . Throat cancer Father 61    Social History:  Social History   Socioeconomic History  . Marital status: Divorced    Spouse name: Not on file  . Number of children: 2  . Years of education: Not on file  . Highest education level: Not on file  Occupational History  . Occupation: applying for disability  Tobacco Use  . Smoking status: Never Smoker  . Smokeless tobacco: Never Used  Substance and Sexual Activity  . Alcohol use: Yes    Alcohol/week: 63.0 standard drinks    Types: 63 Cans of beer per week    Comment: weekly "I have cut back"; 4 days 4 vs 12 pack   . Drug use: Yes    Types: Marijuana    Comment: cocaiane not currently  . Sexual activity: Yes  Other Topics Concern  . Not on file  Social History Narrative   She moved with a boyfriend to [place] and was followed at [facility].  He died of a massive heart attack in 8/18, per her report, and so she moved back to [place] and is living with a friend.      Right handed   2 story home    Lives alone 11/09/19   Social Determinants of Health   Financial Resource Strain:   . Difficulty of Paying Living Expenses: Not on file  Food Insecurity:   . Worried About Charity fundraiser in the Last Year: Not on file  . Ran Out of Food in the Last Year: Not on file  Transportation Needs:   . Lack of Transportation (Medical): Not on file  . Lack of Transportation (Non-Medical): Not on file  Physical Activity:   . Days of Exercise per Week: Not on file  . Minutes of Exercise per Session: Not on file  Stress:   . Feeling of Stress : Not on file  Social Connections:   . Frequency of Communication with Friends and Family: Not  on file  . Frequency of Social Gatherings with Friends and Family: Not on file  . Attends Religious Services: Not on file  . Active Member of Clubs or Organizations: Not on file  . Attends Archivist Meetings: Not on file  . Marital Status: Not on file    Allergies: No Known Allergies  Metabolic Disorder Labs: Lab Results  Component Value Date   HGBA1C 4.8 08/13/2019   No results found for: PROLACTIN Lab Results  Component Value Date   CHOL 184 05/05/2019   TRIG 114 05/05/2019   HDL 72 05/05/2019   CHOLHDL 2.6 05/05/2019   LDLCALC  89 05/05/2019   LDLCALC 63 05/20/2018   Lab Results  Component Value Date   TSH 1.470 05/05/2019   TSH 1.480 05/20/2018    Therapeutic Level Labs: No results found for: LITHIUM No results found for: VALPROATE No components found for:  CBMZ  Current Medications: Current Outpatient Medications  Medication Sig Dispense Refill  . albuterol (VENTOLIN HFA) 108 (90 Base) MCG/ACT inhaler Inhale 2 puffs into the lungs every 6 (six) hours as needed for wheezing or shortness of breath. 8 g 11  . alum & mag hydroxide-simeth (MAALOX/MYLANTA) 200-200-20 MG/5ML suspension Take 15 mLs by mouth every 4 (four) hours as needed for indigestion or heartburn. 355 mL 0  . amLODipine (NORVASC) 5 MG tablet Take 1 tablet (5 mg total) by mouth daily. 30 tablet 3  . ARIPiprazole (ABILIFY) 5 MG tablet TAKE 1 TABLET BY MOUTH ONCE A DAY 30 tablet 1  . cephALEXin (KEFLEX) 500 MG capsule Take 1 capsule (500 mg total) by mouth 3 (three) times daily. (Patient taking differently: Take 500 mg by mouth 2 (two) times daily. ) 30 capsule 0  . clonazePAM (KLONOPIN) 1 MG tablet Take 1 tablet (1 mg total) by mouth 2 (two) times daily as needed for anxiety. 60 tablet 1  . FLUoxetine (PROZAC) 40 MG capsule Take 1 capsule (40 mg total) by mouth daily. 30 capsule 3  . folic acid (FOLVITE) 1 MG tablet Take 1 tablet (1 mg total) by mouth daily. 30 tablet 3  . gabapentin (NEURONTIN)  300 MG capsule Take 2 capsules 2 times a day. (Patient taking differently: Take 600 mg by mouth 2 (two) times daily. ) 120 capsule 3  . hydrOXYzine (ATARAX/VISTARIL) 25 MG tablet TAKE 1 TABLET (25 MG TOTAL) BY MOUTH EVERY 6 (SIX) HOURS AS NEEDED FOR ITCHING. 90 tablet 1  . lactulose (CHRONULAC) 10 GM/15ML solution Take 30 mLs (20 g total) by mouth 3 (three) times daily. Goal to have 2-3 BMs daily (Patient taking differently: Take 20 g by mouth 3 (three) times daily as needed for moderate constipation. Goal to have 2-3 BMs daily) 240 mL 3  . levETIRAcetam (KEPPRA) 500 MG tablet Take 1 tablet (500 mg total) by mouth 2 (two) times daily. 180 tablet 3  . mirtazapine (REMERON) 30 MG tablet Take 1 tablet (30 mg total) by mouth at bedtime. 30 tablet 1  . Multiple Vitamin (MULTIVITAMIN WITH MINERALS) TABS tablet Take 1 tablet by mouth daily.    [name] Kitchen oxyCODONE-acetaminophen (PERCOCET/ROXICET) 5-325 MG tablet Take 1 tablet by mouth every 6 (six) hours as needed for severe pain. 10 tablet 0  . polyethylene glycol (MIRALAX / GLYCOLAX) 17 g packet Take 17 g by mouth 2 (two) times daily. (Patient taking differently: Take 17 g by mouth daily as needed for mild constipation. ) 14 each 0  . Sofosbuvir-Velpatasvir (EPCLUSA) 400-100 MG TABS Take 1 tablet by mouth daily. Take 1 tablet by mouth daily. 28 tablet 5  . thiamine 100 MG tablet Take 1 tablet (100 mg total) by mouth daily. 30 tablet 0   No current facility-administered medications for this visit.     Psychiatric Specialty Exam: Review of Systems  Psychiatric/Behavioral: The patient is nervous/anxious.   All other systems reviewed and are negative.   There were no vitals taken for this visit.There is no height or weight on file to calculate BMI.  General Appearance: NA  Eye Contact:  NA  Speech:  Clear and Coherent and Normal Rate  Volume:  Normal  Mood:  Anxious  Affect:  NA  Thought Process:  Descriptions of Associations: Circumstantial  Orientation:   Full (Time, Place, and Person)  Thought Content: Rumination   Suicidal Thoughts:  No  Homicidal Thoughts:  No  Memory:  Immediate;   Fair Recent;   Fair Remote;   Fair  Judgement:  Fair  Insight:  Fair  Psychomotor Activity:  NA  Concentration:  Concentration: Fair  Recall:  [place] of Knowledge: Fair  Language: Good  Akathisia:  Negative  Handed:  Right  AIMS (if indicated): not done  Assets:  Communication Skills Desire for Improvement Housing Resilience  ADL's:  Intact  Cognition: WNL  Sleep:  Good   Screenings: AIMS     Admission (Discharged) from 08/25/2018 in [facility] Admission (Discharged) from 10/28/2017 in [facility]  AIMS Total Score  0  0    AUDIT     Admission (Discharged) from 08/25/2018 in [facility] Admission (Discharged) from 10/28/2017 in [facility]  Alcohol Use Disorder Identification Test Final Score (AUDIT)  40  32    PHQ2-9     Office Visit from 05/05/2019 in [facility] Office Visit from 04/22/2019 in [facility] Office Visit from 02/19/2019 in [facility] Office Visit from 09/29/2018 in [facility] Office Visit from 12/16/2017 in [facility] for Infectious Disease  PHQ-2 Total Score  0  0  0  0  1       Assessment and Plan: 49 yo divorced female with a long hx of alcohol use disorder, polysubstance abuse (cocaine, cannabis, heroin, benzodiaepines), and past diagnoses of major depressive disorder vs substance-induced mood disorder vs bipolar disorder. Diagnosis of schizophrenia is also mentioned in the record but patient is unable to provide any clear history of psychotic episodes unrelated to her drug/alcohol use. She was first treated for mental problems at age 56 when she became depressed and OD on  meds..She has been taking fluoxetine (up to 60 mg) for over 20 years per her report. She also tried buspirone and hydroxyzine for anxiety with no clear benefit. Recently prescribed trazodone for insomnia does not appear to wok either. She has never been on any mood stabilizers except for lithium or antipsychotics apart from haloperidol. She did not like how she felt on either of those. She started abusing alcohol at age of 81. At one time she was drinking a 1/2 gallon of vodka plus some beer on a daily basis. She has a hx of alcohol withdrawal delirium. She has some periods of sobriety lasting few months at a time but cannot provide more details. 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 She minimizes use of illicit drugs claiming that she only uses them when "partying".We have added mirtazapine and she reports that sleep has improved. She continues to complain primarily of severe anxiety both generalized and panic type. She has no support, struggles financially. Briana Sweeney reports that she has not been drinking alcohol since early September 2020. She no longer is taking lactulose or topiramate. She has not been taking buspirone and finds lorazepam not as effective for anxiety anymore. In the past she was on clonazepam and would like to try it again. Instead of lorazepam.  [id number] anxiety disorder;Mood disorder unspecified (Drug-induced vs bipolar disorder unspecified); Alcohol use disorder severe in early remission;  Polysubstance use disorder  Plan: We will continue fluoxetine 40 mg daily,Abilify 5 mg, Remeron to 30 mg and change lorazepam to clonazepam 1 mg prn anxiety (bid). Next appointment in 6 weeks. The plan was discussed with patient who had an opportunity to ask questions and these were all answered. I spend11minutes in phone consultationwith the patient.    Stephanie Acre, MD 11/15/2019, 2:43 PM

## 2019-11-15 NOTE — Telephone Encounter (Signed)
[facility] Patient Advocate Encounter  Prior Authorization for Briana Sweeney has been approved.    Effective dates: 11/12/2019 through 12/12/2019  Patients co-pay is $3.00.   This information has been communicated to the [facility].  Bartholomew Crews, CPhT Specialty Pharmacy Patient [facility] for Infectious Disease Phone: [phone] Fax: [phone] 11/15/2019 8:39 AM

## 2019-11-16 ENCOUNTER — Telehealth: Payer: Self-pay | Admitting: Family Medicine

## 2019-11-16 MED FILL — MIRTAZAPINE 30 MG TABLET: 30 | 30 days supply | Qty: 30 | Fill #0

## 2019-11-17 NOTE — Telephone Encounter (Signed)
error 

## 2019-11-23 ENCOUNTER — Other Ambulatory Visit: Payer: Medicaid Other

## 2019-11-23 ENCOUNTER — Other Ambulatory Visit: Payer: Self-pay | Admitting: Podiatry

## 2019-11-23 MED FILL — GABAPENTIN 300 MG CAPSULE: 300 | 30 days supply | Qty: 120 | Fill #1

## 2019-11-24 ENCOUNTER — Encounter: Payer: Medicaid Other | Admitting: Podiatry

## 2019-11-24 ENCOUNTER — Other Ambulatory Visit: Payer: Self-pay | Admitting: Podiatry

## 2019-11-24 MED ORDER — OXYCODONE-ACETAMINOPHEN 5-325 MG PO TABS: 1.0000 | ORAL_TABLET | Freq: Three times a day (TID) | ORAL | 0 refills | Status: DC | PRN

## 2019-11-24 NOTE — Progress Notes (Signed)
PRN postop pain 

## 2019-11-24 NOTE — Telephone Encounter (Signed)
Rx sent. - Dr. Alexsandria Kivett

## 2019-11-25 MED FILL — OXYCODONE-ACETAMINOPHEN 5-3: 5-325 | 7 days supply | Qty: 20 | Fill #0

## 2019-12-01 ENCOUNTER — Ambulatory Visit: Payer: Medicaid Other | Attending: Internal Medicine

## 2019-12-01 ENCOUNTER — Ambulatory Visit (INDEPENDENT_AMBULATORY_CARE_PROVIDER_SITE_OTHER): Payer: Medicaid Other

## 2019-12-01 ENCOUNTER — Ambulatory Visit (INDEPENDENT_AMBULATORY_CARE_PROVIDER_SITE_OTHER): Payer: Medicaid Other | Admitting: Podiatry

## 2019-12-01 ENCOUNTER — Other Ambulatory Visit: Payer: Self-pay

## 2019-12-01 DIAGNOSIS — Z9889 Other specified postprocedural states: Secondary | ICD-10-CM

## 2019-12-01 DIAGNOSIS — Z23 Encounter for immunization: Secondary | ICD-10-CM

## 2019-12-01 DIAGNOSIS — M21611 Bunion of right foot: Secondary | ICD-10-CM

## 2019-12-01 DIAGNOSIS — M21621 Bunionette of right foot: Secondary | ICD-10-CM

## 2019-12-01 DIAGNOSIS — M216X1 Other acquired deformities of right foot: Secondary | ICD-10-CM

## 2019-12-01 MED ORDER — OXYCODONE-ACETAMINOPHEN 5-325 MG PO TABS: 1.0000 | ORAL_TABLET | Freq: Three times a day (TID) | ORAL | 0 refills | Status: AC | PRN

## 2019-12-01 MED FILL — OXYCODONE-ACETAMINOPHEN 5-3: 5-325 | 7 days supply | Qty: 20 | Fill #0

## 2019-12-01 NOTE — Progress Notes (Signed)
   Covid-19 Vaccination Clinic  Name:  Briana Sweeney    MRN: [id number] DOB: 1971/01/22  12/01/2019  [name] was observed post Covid-19 immunization for 15 minutes without incident. She was provided with Vaccine Information Sheet and instruction to access the V-Safe system.   [name] was instructed to call 911 with any severe reactions post vaccine: [name] Kitchen Difficulty breathing  . Swelling of face and throat  . A fast heartbeat  . A bad rash all over body  . Dizziness and weakness   Immunizations Administered    Name Date Dose VIS Date Route   Pfizer COVID-19 Vaccine 12/01/2019  9:12 AM 0.3 mL 08/20/2019 Intramuscular   Manufacturer: [place]   Lot: [id number]   [place]: [id number]

## 2019-12-06 ENCOUNTER — Ambulatory Visit: Payer: Medicaid Other | Admitting: Family Medicine

## 2019-12-06 MED FILL — ARIPIPRAZOLE 5 MG TABS: 5 | 30 days supply | Qty: 30 | Fill #1

## 2019-12-07 ENCOUNTER — Ambulatory Visit: Payer: Medicaid Other | Admitting: Family Medicine

## 2019-12-07 ENCOUNTER — Telehealth: Payer: Self-pay | Admitting: Pharmacy Technician

## 2019-12-07 MED FILL — SOFOSBUVIR-VELPATASVIR 400-: 400-100 | 28 days supply | Qty: 28 | Fill #5

## 2019-12-07 NOTE — Telephone Encounter (Signed)
[facility] Patient Advocate Encounter   Received notification from [place] that prior authorization for Briana Sweeney is required.   PA submitted on 12/07/2019 Key [id number] W Status is pending    [facility] will continue to follow and check [place] tracks for updates  Bartholomew Crews, [place] Patient [facility] for Infectious Disease Phone: [phone] Fax: [phone] 12/07/2019 8:51 AM

## 2019-12-07 NOTE — Telephone Encounter (Addendum)
[facility] Patient Advocate Encounter  Prior Authorization for Briana Sweeney has been approved.  (generic)  PA# [id number] Effective dates: 12/07/2019 through 01/06/2020 She will pick up refill from [facility] 12/07/2019  Patients co-pay is $3.00   [facility] Clinic will continue to follow.  Bartholomew Crews, CPhT Specialty Pharmacy Patient [facility] for Infectious Disease Phone: [phone] Fax: [phone] 12/07/2019 12:18 PM

## 2019-12-07 NOTE — Progress Notes (Signed)
   Subjective:  Patient presents today status post bunionectomy and Tailor's bunionectomy right. DOS: 09/30/2019.  Patient continues to have throbbing and swelling to the surgical foot.  She does admit to excessive walking on the foot.  She presents for further treatment evaluation   Past Medical History:  Diagnosis Date  . Alcohol use   . Anxiety   . Bipolar affective disorder ([place])    With anxiety features  . Cirrhosis of liver ([place])    Due to alcohol and hepatitis C  . Depression   . 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 ETOHism ([place])   . GERD (gastroesophageal reflux disease)   . Hematemesis 02/10/2018  . Hepatitis C 2018   hepatitis c and alcohol related hepatitis  . Heroin abuse ([place])   . History of blood transfusion    "blood doesn't clot; I fell down and had to have a transfusion"  . History of kidney stones   . Menopause 2016  . Migraine    "when I get really stressed" (09/01/2017)  . Schizophrenia ([place])   . Seizures ([place])    "when I run out of my RX; lots recently" (09/01/2017)      Objective/Physical Exam Neurovascular status intact. Minimal dehiscence noted along bunion incision site that appears to have improved since last visit. No active bleeding noted. Heavy edema noted to the surgical extremity.  Assessment: 1. S/p bunionectomy and Tailor's bunionectomy right. DOS: 09/30/2019 2. Edema right surgical foot    Plan of Care:  1. Patient was evaluated. 2.  Patient discontinued the cam boot last week.  I recommend that she resumes the cam boot because she has been walking excessively on the surgical foot.  I reinforced that she needs to reduce her activity and rest the foot with ice and elevation 3.  Compression anklet provided 4.  Refill prescription for Percocet 5/325 mg 5.  Return to clinic in 4 weeks  Edrick Kins, DPM [facility]  Dr. Edrick Kins, [address]                                        [place],  [place] [address]                Office [phone]  Fax [phone]

## 2019-12-09 MED FILL — AMLODIPINE BESYLATE 5 MG TA: 5 | 30 days supply | Qty: 30 | Fill #1

## 2019-12-09 MED FILL — FLUoxetine HCL 40 MG CAPS: 40 | 30 days supply | Qty: 30 | Fill #1

## 2019-12-13 ENCOUNTER — Encounter: Payer: Self-pay | Admitting: Family Medicine

## 2019-12-13 ENCOUNTER — Other Ambulatory Visit: Payer: Self-pay

## 2019-12-13 ENCOUNTER — Ambulatory Visit (INDEPENDENT_AMBULATORY_CARE_PROVIDER_SITE_OTHER): Payer: Medicaid Other | Admitting: Family Medicine

## 2019-12-13 ENCOUNTER — Telehealth: Payer: Self-pay | Admitting: *Deleted

## 2019-12-13 VITALS — BP 119/68 | HR 85 | Temp 98.5°F | Ht 61.0 in | Wt 144.0 lb

## 2019-12-13 DIAGNOSIS — R8290 Unspecified abnormal findings in urine: Secondary | ICD-10-CM | POA: Diagnosis not present

## 2019-12-13 DIAGNOSIS — Z0000 Encounter for general adult medical examination without abnormal findings: Secondary | ICD-10-CM

## 2019-12-13 DIAGNOSIS — Z09 Encounter for follow-up examination after completed treatment for conditions other than malignant neoplasm: Secondary | ICD-10-CM

## 2019-12-13 DIAGNOSIS — F419 Anxiety disorder, unspecified: Secondary | ICD-10-CM

## 2019-12-13 DIAGNOSIS — D696 Thrombocytopenia, unspecified: Secondary | ICD-10-CM

## 2019-12-13 DIAGNOSIS — M21621 Bunionette of right foot: Secondary | ICD-10-CM | POA: Diagnosis not present

## 2019-12-13 DIAGNOSIS — Z9889 Other specified postprocedural states: Secondary | ICD-10-CM | POA: Diagnosis not present

## 2019-12-13 DIAGNOSIS — B182 Chronic viral hepatitis C: Secondary | ICD-10-CM

## 2019-12-13 DIAGNOSIS — F1011 Alcohol abuse, in remission: Secondary | ICD-10-CM

## 2019-12-13 DIAGNOSIS — R0602 Shortness of breath: Secondary | ICD-10-CM

## 2019-12-13 LAB — POCT URINALYSIS DIPSTICK
Glucose, UA: NEGATIVE
Nitrite, UA: NEGATIVE
Protein, UA: POSITIVE — AB
Spec Grav, UA: >=1.03 — AB (ref 1.010–1.025)
Urobilinogen, UA: 0.2 E.U./dL
pH, UA: 5.5 (ref 5.0–8.0)

## 2019-12-13 MED ORDER — FUROSEMIDE 40 MG PO TABS: 40.0000 mg | ORAL_TABLET | Freq: Every day | ORAL | 7 refills | Status: DC

## 2019-12-13 MED ORDER — HYDROXYZINE HCL 25 MG PO TABS: 25.0000 mg | ORAL_TABLET | Freq: Four times a day (QID) | ORAL | 1 refills | Status: DC | PRN

## 2019-12-13 MED FILL — hydrOXYzine HCL 25 MG TABS: 25 | 22 days supply | Qty: 90 | Fill #0

## 2019-12-13 MED FILL — FUROSEMIDE 40 MG TAB: 40 | 30 days supply | Qty: 30 | Fill #0

## 2019-12-13 NOTE — Telephone Encounter (Signed)
Patient is 2 mos postop and h/o polysubstance abuse and major depressive disorder. Recommend OTC motrin 800 prn for pain with tylenol in between. Recommend reducing activity and elevation as needed.  - Dr. Amalia Hailey

## 2019-12-13 NOTE — Progress Notes (Signed)
Patient [facility]    Established Patient Office Visit  Subjective:  Patient ID: Briana Sweeney, female    DOB: [birth year]  Age: 49 y.o. MRN: [id number]  CC:  Chief Complaint  Patient presents with  . Follow-up    painful voids    HPI Briana Sweeney is a 49 year old female who presents for Hospital Follow Up today.   Past Medical History:  Diagnosis Date  . Alcohol use   . Anxiety   . Bipolar affective disorder ([place])    With anxiety features  . 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 Depression   . 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 ETOHism ([place])   . GERD (gastroesophageal reflux disease)   . Hematemesis 02/10/2018  . Hepatitis C 2018   hepatitis c and alcohol related hepatitis  . Heroin abuse ([place])   . History of blood transfusion    "blood doesn't clot; I fell down and had to have a transfusion"  . History of kidney stones   . Menopause 2016  . Migraine    "when I get really stressed" (09/01/2017)  . Schizophrenia ([place])   . Seizures ([place])    "when I run out of my RX; lots recently" (09/01/2017)   Current Status: Since her last office visit, she is doing well with no complaints. Right foot Bunionette surgery on 12/01/2019. She has symptoms of UTI X 1 week now. She has not taken any medications for symptoms. She denies fevers, chills, fatigue, recent infections, weight loss, and night sweats. She has not had any headaches, visual changes, dizziness, and falls. No chest pain, heart palpitations, cough and shortness of breath reported. No reports of GI problems such as nausea, vomiting, diarrhea, and constipation. She has no reports of blood in stools, dysuria and hematuria. Her anxiety is mild today. She continues to follow up with Psychiatry as needed. She denies suicidal ideations, homicidal ideations, or auditory hallucinations. She is taking all medications as prescribed. She denies pain today.  Her most recent platelet count was at 24 on 02/16/2018. She has bruising has multiple bruises over her today. She continues to deny petechiae, gingival bleeding, epistaxis, profuse bleeding from superficial cuts, melena, menorrhagia, mentrorrhagia, hematochezia, and hematuria. She states that her appetite is good. Denies abdominal pain, diarrhea, and constipation.  Past Surgical History:  Procedure Laterality Date  . ESOPHAGOGASTRODUODENOSCOPY N/A 09/03/2017   Procedure: ESOPHAGOGASTRODUODENOSCOPY (EGD);  Surgeon: Doran Stabler, MD;  Location: [facility];  Service: Gastroenterology;  Laterality: N/A;  . FINGER FRACTURE SURGERY Left    "shattered my pinky"  . FRACTURE SURGERY    . I & D EXTREMITY Left 09/18/2018   Procedure: IRRIGATION AND DEBRIDEMENT EXTREMITY;  Surgeon: Leanora Cover, MD;  [facility];  Service: Orthopedics;  Laterality: Left;  . IR PARACENTESIS  07/23/2017  . IR PARACENTESIS  07/2017   "did it twice in the same week" (09/01/2017)  . SHOULDER OPEN ROTATOR CUFF REPAIR Right   . TOOTH EXTRACTION  08/2019  . TUBAL LIGATION    . VAGINAL HYSTERECTOMY      Family History  Problem Relation Age of Onset  . Lung cancer Mother 41  . Alcohol abuse Mother   . Throat cancer Father 52    Social History   Socioeconomic History  . Marital status: Divorced    Spouse name: Not on file  . Number of children:  2  . Years of education: Not on file  . Highest education level: Not on file  Occupational History  . Occupation: applying for disability  Tobacco Use  . Smoking status: Never Smoker  . Smokeless tobacco: Never Used  Substance and Sexual Activity  . Alcohol use: Yes    Alcohol/week: 63.0 standard drinks    Types: 63 Cans of beer per week    Comment: weekly "I have cut back"; 4 days 4 vs 12 pack   . Drug use: Yes    Types: Marijuana    Comment: cocaiane not currently  . Sexual activity: Yes  Other Topics Concern  . Not on file  Social History Narrative    She moved with a boyfriend to [place] and was followed at [facility].  He died of a massive heart attack in 8/18, per her report, and so she moved back to [place] and is living with a friend.      Right handed   2 story home    Lives alone 11/09/19   Social Determinants of Health   Financial Resource Strain:   . Difficulty of Paying Living Expenses:   Food Insecurity:   . Worried About Charity fundraiser in the Last Year:   . Arboriculturist in the Last Year:   Transportation Needs:   . Film/video editor (Medical):   [name] Kitchen Lack of Transportation (Non-Medical):   Physical Activity:   . Days of Exercise per Week:   . Minutes of Exercise per Session:   Stress:   . Feeling of Stress :   Social Connections:   . Frequency of Communication with Friends and Family:   . Frequency of Social Gatherings with Friends and Family:   . Attends Religious Services:   . Active Member of Clubs or Organizations:   . Attends Archivist Meetings:   [name] Kitchen Marital Status:   Intimate Partner Violence:   . Fear of Current or Ex-Partner:   . Emotionally Abused:   [name] Kitchen Physically Abused:   . Sexually Abused:     Outpatient Medications Prior to Visit  Medication Sig Dispense Refill  . albuterol (VENTOLIN HFA) 108 (90 Base) MCG/ACT inhaler Inhale 2 puffs into the lungs every 6 (six) hours as needed for wheezing or shortness of breath. 8 g 11  . alum & mag hydroxide-simeth (MAALOX/MYLANTA) 200-200-20 MG/5ML suspension Take 15 mLs by mouth every 4 (four) hours as needed for indigestion or heartburn. 355 mL 0  . amLODipine (NORVASC) 5 MG tablet Take 1 tablet (5 mg total) by mouth daily. (Patient not taking: Reported on 11/15/2019) 30 tablet 3  . ARIPiprazole (ABILIFY) 5 MG tablet TAKE 1 TABLET BY MOUTH ONCE A DAY 30 tablet 1  . cephALEXin (KEFLEX) 500 MG capsule Take 1 capsule (500 mg total) by mouth 3 (three) times daily. (Patient not taking: Reported on 11/15/2019) 30 capsule 0  . clonazePAM  (KLONOPIN) 1 MG tablet Take 1 tablet (1 mg total) by mouth 2 (two) times daily as needed for anxiety. 60 tablet 1  . FLUoxetine (PROZAC) 40 MG capsule Take 1 capsule (40 mg total) by mouth daily. 30 capsule 3  . folic acid (FOLVITE) 1 MG tablet Take 1 tablet (1 mg total) by mouth daily. 30 tablet 3  . gabapentin (NEURONTIN) 300 MG capsule Take 2 capsules 2 times a day. (Patient taking differently: Take 600 mg by mouth 2 (two) times daily. ) 120 capsule 3  . hydrOXYzine (ATARAX/VISTARIL)  25 MG tablet TAKE 1 TABLET (25 MG TOTAL) BY MOUTH EVERY 6 (SIX) HOURS AS NEEDED FOR ITCHING. 90 tablet 1  . ibuprofen (ADVIL) 200 MG tablet Take 200 mg by mouth every 6 (six) hours as needed for moderate pain.    [name] Kitchen lactulose (CHRONULAC) 10 GM/15ML solution Take 30 mLs (20 g total) by mouth 3 (three) times daily. Goal to have 2-3 BMs daily (Patient taking differently: Take 20 g by mouth 3 (three) times daily as needed for moderate constipation. Goal to have 2-3 BMs daily) 240 mL 3  . levETIRAcetam (KEPPRA) 500 MG tablet Take 1 tablet (500 mg total) by mouth 2 (two) times daily. 180 tablet 3  . LORazepam (ATIVAN) 1 MG tablet Take 1 mg by mouth every 8 (eight) hours as needed for anxiety.    . mirtazapine (REMERON) 30 MG tablet Take 1 tablet (30 mg total) by mouth at bedtime. 30 tablet 1  . Multiple Vitamin (MULTIVITAMIN WITH MINERALS) TABS tablet Take 1 tablet by mouth daily.    . polyethylene glycol (MIRALAX / GLYCOLAX) 17 g packet Take 17 g by mouth 2 (two) times daily. (Patient taking differently: Take 17 g by mouth daily as needed for mild constipation. ) 14 each 0  . Sofosbuvir-Velpatasvir (EPCLUSA) 400-100 MG TABS Take 1 tablet by mouth daily. Take 1 tablet by mouth daily. 28 tablet 5  . thiamine 100 MG tablet Take 1 tablet (100 mg total) by mouth daily. 30 tablet 0   No facility-administered medications prior to visit.    No Known Allergies  ROS Review of Systems  Constitutional: Negative.   HENT:  Negative.   Eyes: Negative.   Respiratory: Positive for cough (occasional ) and shortness of breath (occasional ).   Cardiovascular: Negative.   Gastrointestinal: Negative.   Endocrine: Negative.   Genitourinary: Negative.   Musculoskeletal: Positive for arthralgias (generalized joint pain. ).  Skin: Negative.   Allergic/Immunologic: Negative.   Neurological: Positive for dizziness (occasional ) and headaches (occasional ).  Hematological: Negative.   Psychiatric/Behavioral: Negative.    Objective:    Physical Exam  Constitutional: She is oriented to person, place, and time. She appears well-developed and well-nourished.  HENT:  Head: Normocephalic and atraumatic.  Eyes: Conjunctivae are normal.  Cardiovascular: Normal rate, regular rhythm, normal heart sounds and intact distal pulses.  Pulmonary/Chest: Effort normal and breath sounds normal.  Abdominal: Soft. Bowel sounds are normal.  Musculoskeletal:        General: Normal range of motion.     Cervical back: Normal range of motion and neck supple.  Neurological: She is alert and oriented to person, place, and time. She has normal reflexes.  Skin: Skin is warm and dry.  Psychiatric: She has a normal mood and affect. Her behavior is normal. Thought content normal.  Nursing note and vitals reviewed.   BP 119/68   Pulse 85   Temp 98.5 F (36.9 C)   Ht 5\' 1"  (1.549 m)   Wt 144 lb (65.3 kg)   BMI 27.21 kg/m  Wt Readings from Last 3 Encounters:  12/13/19 144 lb (65.3 kg)  11/09/19 147 lb (66.7 kg)  11/01/19 150 lb (68 kg)     There are no preventive care reminders to display for this patient.  There are no preventive care reminders to display for this patient.  Lab Results  Component Value Date   TSH 1.470 05/05/2019   Lab Results  Component Value Date   WBC 3.1 (L) 11/01/2019  HGB 13.6 11/01/2019   HCT 41.0 11/01/2019   MCV 94.3 11/01/2019   PLT 49 (L) 11/01/2019   Lab Results  Component Value Date   NA  140 11/01/2019   K 4.1 11/01/2019   CO2 20 (L) 11/01/2019   GLUCOSE 92 11/01/2019   BUN 9 11/01/2019   CREATININE 0.67 11/01/2019   BILITOT 0.7 11/01/2019   ALKPHOS 201 (H) 11/01/2019   AST 37 11/01/2019   ALT 25 11/01/2019   PROT 7.4 11/01/2019   ALBUMIN 3.3 (L) 11/01/2019   CALCIUM 9.0 11/01/2019   ANIONGAP 12 11/01/2019   Lab Results  Component Value Date   CHOL 184 05/05/2019   Lab Results  Component Value Date   HDL 72 05/05/2019   Lab Results  Component Value Date   LDLCALC 89 05/05/2019   Lab Results  Component Value Date   TRIG 114 05/05/2019   Lab Results  Component Value Date   CHOLHDL 2.6 05/05/2019   Lab Results  Component Value Date   HGBA1C 4.8 08/13/2019      Assessment & Plan:   1. Hospital discharge follow-up  2. Bunionette of right foot  3. S/P foot surgery, right Well-healed scar.   4. Thrombocytopenia ([place]) Her most recent platelet count was at 24 on 02/16/2018. She has bruising has multiple bruises over her today. She continues to deny petechiae, gingival bleeding, epistaxis, profuse bleeding from superficial cuts, melena, menorrhagia, mentrorrhagia, hematochezia, and hematuria. She states that her appetite is good. Denies abdominal pain, nausea, vomiting, diarrhea, and constipation.   5. Shortness of breath No signs or symptom of respiratory distress noted or reported.   6. Anxiety Stable.   7. History of alcohol abuse  8. Abnormal urine Results are pending.  - POCT urinalysis dipstick  9. Abnormal urinalysis - Urine Culture  10. Health care maintenance - Comprehensive metabolic panel - CBC with Differential - Lipid Panel - TSH - Vitamin B12 - Vitamin D, 25-hydroxy  11. Follow up She will follow up in 6 months.  No orders of the defined types were placed in this encounter.   Orders Placed This Encounter  Procedures  . Urine Culture  . Comprehensive metabolic panel  . CBC with Differential  . Lipid Panel  . TSH   . Vitamin B12  . Vitamin D, 25-hydroxy  . POCT urinalysis dipstick    Referral Orders  No referral(s) requested today    Kathe Becton,  MSN, FNP-BC [place] [address] [place], [place] [address] [phone]- fax   Problem List Items Addressed This Visit      Other   Abnormal urine   Relevant Orders   POCT urinalysis dipstick (Completed)   History of alcohol abuse   Thrombocytopenia ([place])    Other Visit Diagnoses    Hospital discharge follow-up    -  Primary   Bunionette of right foot       S/P foot surgery, right       Shortness of breath       Anxiety       Abnormal urinalysis       Relevant Orders   Urine Culture   Health care maintenance       Relevant Orders   Comprehensive metabolic panel   CBC with Differential   Lipid Panel   TSH   Vitamin B12   Vitamin D, 25-hydroxy   Follow up  No orders of the defined types were placed in this encounter.   Follow-up: No follow-ups on file.    [name], FNP

## 2019-12-13 NOTE — Telephone Encounter (Signed)
Pt states she is having a lot of aching and swelling in her right foot, and would like a refill of oxycodone.

## 2019-12-14 LAB — COMPREHENSIVE METABOLIC PANEL
ALT: 23 IU/L (ref 0–32)
AST: 56 IU/L — ABNORMAL HIGH (ref 0–40)
Albumin/Globulin Ratio: 1.1 — ABNORMAL LOW (ref 1.2–2.2)
Albumin: 4 g/dL (ref 3.8–4.8)
Alkaline Phosphatase: 241 IU/L — ABNORMAL HIGH (ref 39–117)
BUN/Creatinine Ratio: 9 (ref 9–23)
BUN: 6 mg/dL (ref 6–24)
Bilirubin Total: 1.3 mg/dL — ABNORMAL HIGH (ref 0.0–1.2)
CO2: 20 mmol/L (ref 20–29)
Calcium: 8.9 mg/dL (ref 8.7–10.2)
Chloride: 106 mmol/L (ref 96–106)
Creatinine, Ser: 0.66 mg/dL (ref 0.57–1.00)
GFR calc Af Amer: 122 mL/min/{1.73_m2} (ref >59)
GFR calc non Af Amer: 106 mL/min/{1.73_m2} (ref >59)
Globulin, Total: 3.5 g/dL (ref 1.5–4.5)
Glucose: 98 mg/dL (ref 65–99)
Potassium: 4.1 mmol/L (ref 3.5–5.2)
Sodium: 140 mmol/L (ref 134–144)
Total Protein: 7.5 g/dL (ref 6.0–8.5)

## 2019-12-14 LAB — CBC WITH DIFFERENTIAL/PLATELET
Basophils Absolute: 0 10*3/uL (ref 0.0–0.2)
Basos: 0 %
EOS (ABSOLUTE): 0.1 10*3/uL (ref 0.0–0.4)
Eos: 2 %
Hematocrit: 40 % (ref 34.0–46.6)
Hemoglobin: 13.9 g/dL (ref 11.1–15.9)
Immature Grans (Abs): 0 10*3/uL (ref 0.0–0.1)
Immature Granulocytes: 0 %
Lymphocytes Absolute: 0.7 10*3/uL (ref 0.7–3.1)
Lymphs: 19 %
MCH: 30.8 pg (ref 26.6–33.0)
MCHC: 34.8 g/dL (ref 31.5–35.7)
MCV: 89 fL (ref 79–97)
Monocytes Absolute: 0.4 10*3/uL (ref 0.1–0.9)
Monocytes: 12 %
Neutrophils Absolute: 2.3 10*3/uL (ref 1.4–7.0)
Neutrophils: 67 %
Platelets: 22 10*3/uL — CL (ref 150–450)
RBC: 4.52 x10E6/uL (ref 3.77–5.28)
RDW: 14.5 % (ref 11.7–15.4)
WBC: 3.4 10*3/uL (ref 3.4–10.8)

## 2019-12-14 LAB — LIPID PANEL
Chol/HDL Ratio: 2 ratio (ref 0.0–4.4)
Cholesterol, Total: 213 mg/dL — ABNORMAL HIGH (ref 100–199)
HDL: 108 mg/dL (ref >39)
LDL Chol Calc (NIH): 90 mg/dL (ref 0–99)
Triglycerides: 85 mg/dL (ref 0–149)
VLDL Cholesterol Cal: 15 mg/dL (ref 5–40)

## 2019-12-14 LAB — TSH: TSH: 0.618 u[IU]/mL (ref 0.450–4.500)

## 2019-12-14 LAB — VITAMIN D 25 HYDROXY (VIT D DEFICIENCY, FRACTURES): Vit D, 25-Hydroxy: 55.7 ng/mL (ref 30.0–100.0)

## 2019-12-14 LAB — VITAMIN B12: Vitamin B-12: 554 pg/mL (ref 232–1245)

## 2019-12-14 MED ORDER — IBUPROFEN 800 MG PO TABS: 800.0000 mg | ORAL_TABLET | Freq: Three times a day (TID) | ORAL | 0 refills | Status: DC | PRN

## 2019-12-14 MED FILL — IBUPROFEN 800 MG TAB: 800 | 10 days supply | Qty: 30 | Fill #0

## 2019-12-14 MED FILL — clonazePAM 1 MG TABS: 1 | 30 days supply | Qty: 60 | Fill #1

## 2019-12-14 NOTE — Telephone Encounter (Signed)
I informed pt of DR. Evans orders to manage pain with OTC ibuprofen and regular strength tylenol. Pt asked if Dr. Amalia Hailey wanted to see her again and I told her to schedule for 4 weeks after 12/01/2019. Pt asked to have Ibuprofen 800mg  called to the pharmacy.

## 2019-12-15 ENCOUNTER — Inpatient Hospital Stay (HOSPITAL_COMMUNITY)
Admission: EM | Admit: 2019-12-15 | Discharge: 2019-12-19 | DRG: 809 | Disposition: A | Discharge status: Home / Self Care | Payer: Medicaid Other | Attending: Internal Medicine | Admitting: Internal Medicine

## 2019-12-15 ENCOUNTER — Other Ambulatory Visit: Payer: Self-pay | Admitting: Family Medicine

## 2019-12-15 ENCOUNTER — Other Ambulatory Visit: Payer: Self-pay

## 2019-12-15 ENCOUNTER — Encounter (HOSPITAL_COMMUNITY): Payer: Self-pay

## 2019-12-15 ENCOUNTER — Emergency Department (HOSPITAL_COMMUNITY): Payer: Medicaid Other

## 2019-12-15 DIAGNOSIS — D61818 Other pancytopenia: Principal | ICD-10-CM | POA: Diagnosis present

## 2019-12-15 DIAGNOSIS — K921 Melena: Secondary | ICD-10-CM | POA: Diagnosis present

## 2019-12-15 DIAGNOSIS — K7460 Unspecified cirrhosis of liver: Secondary | ICD-10-CM

## 2019-12-15 DIAGNOSIS — N390 Urinary tract infection, site not specified: Secondary | ICD-10-CM | POA: Diagnosis present

## 2019-12-15 DIAGNOSIS — Z801 Family history of malignant neoplasm of trachea, bronchus and lung: Secondary | ICD-10-CM

## 2019-12-15 DIAGNOSIS — K7031 Alcoholic cirrhosis of liver with ascites: Secondary | ICD-10-CM | POA: Diagnosis not present

## 2019-12-15 DIAGNOSIS — R569 Unspecified convulsions: Secondary | ICD-10-CM | POA: Diagnosis present

## 2019-12-15 DIAGNOSIS — K068 Other specified disorders of gingiva and edentulous alveolar ridge: Secondary | ICD-10-CM | POA: Diagnosis present

## 2019-12-15 DIAGNOSIS — F1010 Alcohol abuse, uncomplicated: Secondary | ICD-10-CM | POA: Diagnosis present

## 2019-12-15 DIAGNOSIS — D696 Thrombocytopenia, unspecified: Secondary | ICD-10-CM | POA: Diagnosis not present

## 2019-12-15 DIAGNOSIS — K3189 Other diseases of stomach and duodenum: Secondary | ICD-10-CM | POA: Diagnosis present

## 2019-12-15 DIAGNOSIS — K219 Gastro-esophageal reflux disease without esophagitis: Secondary | ICD-10-CM | POA: Diagnosis present

## 2019-12-15 DIAGNOSIS — K7030 Alcoholic cirrhosis of liver without ascites: Secondary | ICD-10-CM | POA: Diagnosis not present

## 2019-12-15 DIAGNOSIS — Z808 Family history of malignant neoplasm of other organs or systems: Secondary | ICD-10-CM

## 2019-12-15 DIAGNOSIS — T8089XA Other complications following infusion, transfusion and therapeutic injection, initial encounter: Secondary | ICD-10-CM | POA: Diagnosis not present

## 2019-12-15 DIAGNOSIS — F209 Schizophrenia, unspecified: Secondary | ICD-10-CM | POA: Diagnosis present

## 2019-12-15 DIAGNOSIS — Z811 Family history of alcohol abuse and dependence: Secondary | ICD-10-CM

## 2019-12-15 DIAGNOSIS — B182 Chronic viral hepatitis C: Secondary | ICD-10-CM | POA: Diagnosis present

## 2019-12-15 DIAGNOSIS — I8500 Esophageal varices without bleeding: Secondary | ICD-10-CM | POA: Diagnosis present

## 2019-12-15 DIAGNOSIS — F419 Anxiety disorder, unspecified: Secondary | ICD-10-CM | POA: Diagnosis present

## 2019-12-15 DIAGNOSIS — I8510 Secondary esophageal varices without bleeding: Secondary | ICD-10-CM | POA: Diagnosis present

## 2019-12-15 DIAGNOSIS — Z20822 Contact with and (suspected) exposure to covid-19: Secondary | ICD-10-CM | POA: Diagnosis present

## 2019-12-15 DIAGNOSIS — Z79899 Other long term (current) drug therapy: Secondary | ICD-10-CM

## 2019-12-15 DIAGNOSIS — N3000 Acute cystitis without hematuria: Secondary | ICD-10-CM | POA: Diagnosis present

## 2019-12-15 DIAGNOSIS — F319 Bipolar disorder, unspecified: Secondary | ICD-10-CM | POA: Diagnosis present

## 2019-12-15 DIAGNOSIS — K7010 Alcoholic hepatitis without ascites: Secondary | ICD-10-CM | POA: Diagnosis present

## 2019-12-15 HISTORY — DX: Thrombocytopenia, unspecified: D69.6

## 2019-12-15 LAB — CBC
HCT: 39.3 % (ref 36.0–46.0)
HCT: 38.3 % (ref 36.0–46.0)
Hemoglobin: 12.6 g/dL (ref 12.0–15.0)
Hemoglobin: 12.4 g/dL (ref 12.0–15.0)
MCH: 30 pg (ref 26.0–34.0)
MCH: 30.5 pg (ref 26.0–34.0)
MCHC: 32.1 g/dL (ref 30.0–36.0)
MCHC: 32.4 g/dL (ref 30.0–36.0)
MCV: 93.6 fL (ref 80.0–100.0)
MCV: 94.1 fL (ref 80.0–100.0)
Platelets: 23 10*3/uL — CL (ref 150–400)
Platelets: 19 10*3/uL — CL (ref 150–400)
RBC: 4.2 MIL/uL (ref 3.87–5.11)
RBC: 4.07 MIL/uL (ref 3.87–5.11)
RDW: 15.5 % (ref 11.5–15.5)
RDW: 15.5 % (ref 11.5–15.5)
WBC: 3 10*3/uL — ABNORMAL LOW (ref 4.0–10.5)
WBC: 1.4 10*3/uL — CL (ref 4.0–10.5)
nRBC: 0 % (ref 0.0–0.2)
nRBC: 0 % (ref 0.0–0.2)

## 2019-12-15 LAB — COMPREHENSIVE METABOLIC PANEL
ALT: 29 U/L (ref 0–44)
ALT: 24 U/L (ref 0–44)
AST: 57 U/L — ABNORMAL HIGH (ref 15–41)
AST: 54 U/L — ABNORMAL HIGH (ref 15–41)
Albumin: 3.6 g/dL (ref 3.5–5.0)
Albumin: 3.2 g/dL — ABNORMAL LOW (ref 3.5–5.0)
Alkaline Phosphatase: 180 U/L — ABNORMAL HIGH (ref 38–126)
Alkaline Phosphatase: 163 U/L — ABNORMAL HIGH (ref 38–126)
Anion gap: 11 (ref 5–15)
Anion gap: 12 (ref 5–15)
BUN: 8 mg/dL (ref 6–20)
BUN: 5 mg/dL — ABNORMAL LOW (ref 6–20)
CO2: 23 mmol/L (ref 22–32)
CO2: 21 mmol/L — ABNORMAL LOW (ref 22–32)
Calcium: 8.8 mg/dL — ABNORMAL LOW (ref 8.9–10.3)
Calcium: 8.3 mg/dL — ABNORMAL LOW (ref 8.9–10.3)
Chloride: 106 mmol/L (ref 98–111)
Chloride: 106 mmol/L (ref 98–111)
Creatinine, Ser: 0.51 mg/dL (ref 0.44–1.00)
Creatinine, Ser: 0.41 mg/dL — ABNORMAL LOW (ref 0.44–1.00)
GFR calc Af Amer: >60 mL/min (ref >60)
GFR calc Af Amer: >60 mL/min (ref >60)
GFR calc non Af Amer: >60 mL/min (ref >60)
GFR calc non Af Amer: >60 mL/min (ref >60)
Glucose, Bld: 93 mg/dL (ref 70–99)
Glucose, Bld: 72 mg/dL (ref 70–99)
Potassium: 3.8 mmol/L (ref 3.5–5.1)
Potassium: 3.5 mmol/L (ref 3.5–5.1)
Sodium: 140 mmol/L (ref 135–145)
Sodium: 139 mmol/L (ref 135–145)
Total Bilirubin: 1 mg/dL (ref 0.3–1.2)
Total Bilirubin: 1.4 mg/dL — ABNORMAL HIGH (ref 0.3–1.2)
Total Protein: 7.4 g/dL (ref 6.5–8.1)
Total Protein: 6.8 g/dL (ref 6.5–8.1)

## 2019-12-15 LAB — TYPE AND SCREEN
ABO/RH(D): O POS
Antibody Screen: NEGATIVE

## 2019-12-15 LAB — URINALYSIS, ROUTINE W REFLEX MICROSCOPIC
Bilirubin Urine: NEGATIVE
Glucose, UA: NEGATIVE mg/dL
Hgb urine dipstick: NEGATIVE
Ketones, ur: NEGATIVE mg/dL
Leukocytes,Ua: NEGATIVE
Nitrite: NEGATIVE
Protein, ur: NEGATIVE mg/dL
Specific Gravity, Urine: 1.006 (ref 1.005–1.030)
pH: 5 (ref 5.0–8.0)

## 2019-12-15 LAB — URINE CULTURE

## 2019-12-15 LAB — PROTIME-INR
INR: 1 (ref 0.8–1.2)
Prothrombin Time: 13.3 seconds (ref 11.4–15.2)

## 2019-12-15 LAB — PHOSPHORUS: Phosphorus: 2.3 mg/dL — ABNORMAL LOW (ref 2.5–4.6)

## 2019-12-15 LAB — MAGNESIUM: Magnesium: 1.4 mg/dL — ABNORMAL LOW (ref 1.7–2.4)

## 2019-12-15 LAB — APTT: aPTT: 28 seconds (ref 24–36)

## 2019-12-15 LAB — PROCALCITONIN: Procalcitonin: <0.1 ng/mL

## 2019-12-15 MED ORDER — SODIUM CHLORIDE 0.9 % IV SOLN
10.0000 mL/h | Freq: Once | INTRAVENOUS | Status: AC
  Administered 2019-12-15: 10 mL/h via INTRAVENOUS

## 2019-12-15 MED ORDER — SOFOSBUVIR-VELPATASVIR 400-100 MG PO TABS
1.0000 | ORAL_TABLET | Freq: Every day | ORAL | Status: DC
  Administered 2019-12-17 – 2019-12-18 (×2): 1 via ORAL

## 2019-12-15 MED ORDER — SODIUM CHLORIDE 0.9 % IV SOLN
1.0000 g | INTRAVENOUS | Status: DC
  Administered 2019-12-15: 1 g via INTRAVENOUS
  Filled 2019-12-15 (×2): qty 10

## 2019-12-15 MED ORDER — SODIUM CHLORIDE 0.9% IV SOLUTION: Freq: Once | INTRAVENOUS | Status: DC

## 2019-12-15 MED ORDER — ONDANSETRON HCL 4 MG/2ML IJ SOLN: 4.0000 mg | Freq: Four times a day (QID) | INTRAMUSCULAR | Status: DC | PRN

## 2019-12-15 MED ORDER — MIRTAZAPINE 15 MG PO TABS
30.0000 mg | ORAL_TABLET | Freq: Every day | ORAL | Status: DC
  Administered 2019-12-15 – 2019-12-18 (×4): 30 mg via ORAL
  Filled 2019-12-15 (×4): qty 2

## 2019-12-15 MED ORDER — FOLIC ACID 1 MG PO TABS: 1.0000 mg | ORAL_TABLET | Freq: Every day | ORAL | Status: DC

## 2019-12-15 MED ORDER — FLUOXETINE HCL 20 MG PO CAPS
40.0000 mg | ORAL_CAPSULE | Freq: Every day | ORAL | Status: DC
  Administered 2019-12-16 – 2019-12-19 (×4): 40 mg via ORAL
  Filled 2019-12-15 (×4): qty 2

## 2019-12-15 MED ORDER — THIAMINE HCL 100 MG/ML IJ SOLN: 100.0000 mg | Freq: Every day | INTRAMUSCULAR | Status: DC

## 2019-12-15 MED ORDER — METHYLPREDNISOLONE SODIUM SUCC 125 MG IJ SOLR
125.0000 mg | Freq: Once | INTRAMUSCULAR | Status: AC
  Administered 2019-12-15: 125 mg via INTRAVENOUS
  Filled 2019-12-15: qty 2

## 2019-12-15 MED ORDER — LACTULOSE 10 GM/15ML PO SOLN
20.0000 g | Freq: Two times a day (BID) | ORAL | Status: DC
  Administered 2019-12-15 – 2019-12-19 (×8): 20 g via ORAL
  Filled 2019-12-15 (×8): qty 30

## 2019-12-15 MED ORDER — ACETAMINOPHEN 325 MG PO TABS
650.0000 mg | ORAL_TABLET | Freq: Four times a day (QID) | ORAL | Status: DC | PRN
  Administered 2019-12-18 – 2019-12-19 (×3): 650 mg via ORAL
  Filled 2019-12-15 (×3): qty 2

## 2019-12-15 MED ORDER — LORAZEPAM 2 MG/ML IJ SOLN
1.0000 mg | INTRAMUSCULAR | Status: AC | PRN
  Administered 2019-12-15 – 2019-12-16 (×3): 1 mg via INTRAVENOUS
  Administered 2019-12-17: 2 mg via INTRAVENOUS
  Administered 2019-12-17: 1 mg via INTRAVENOUS
  Administered 2019-12-18: 2 mg via INTRAVENOUS
  Administered 2019-12-18: 1 mg via INTRAVENOUS
  Filled 2019-12-15 (×7): qty 1

## 2019-12-15 MED ORDER — THIAMINE HCL 100 MG PO TABS
100.0000 mg | ORAL_TABLET | Freq: Every day | ORAL | Status: DC
  Administered 2019-12-16 – 2019-12-19 (×4): 100 mg via ORAL
  Filled 2019-12-15 (×4): qty 1

## 2019-12-15 MED ORDER — ACETAMINOPHEN 650 MG RE SUPP: 650.0000 mg | Freq: Four times a day (QID) | RECTAL | Status: DC | PRN

## 2019-12-15 MED ORDER — LACTULOSE 10 GM/15ML PO SOLN: 20.0000 g | Freq: Two times a day (BID) | ORAL | Status: DC

## 2019-12-15 MED ORDER — PANTOPRAZOLE SODIUM 40 MG IV SOLR
40.0000 mg | Freq: Two times a day (BID) | INTRAVENOUS | Status: DC
  Administered 2019-12-15 – 2019-12-19 (×8): 40 mg via INTRAVENOUS
  Filled 2019-12-15 (×8): qty 40

## 2019-12-15 MED ORDER — SODIUM CHLORIDE 0.9 % IV BOLUS
1000.0000 mL | Freq: Once | INTRAVENOUS | Status: AC
  Administered 2019-12-15: 1000 mL via INTRAVENOUS

## 2019-12-15 MED ORDER — OCTREOTIDE LOAD VIA INFUSION
50.0000 ug | Freq: Once | INTRAVENOUS | Status: AC
  Administered 2019-12-15: 50 ug via INTRAVENOUS
  Filled 2019-12-15: qty 25

## 2019-12-15 MED ORDER — DIPHENHYDRAMINE HCL 50 MG/ML IJ SOLN
25.0000 mg | Freq: Once | INTRAMUSCULAR | Status: AC
  Administered 2019-12-15: 25 mg via INTRAVENOUS
  Filled 2019-12-15: qty 1

## 2019-12-15 MED ORDER — ARIPIPRAZOLE 5 MG PO TABS
5.0000 mg | ORAL_TABLET | Freq: Every day | ORAL | Status: DC
  Administered 2019-12-16 – 2019-12-19 (×4): 5 mg via ORAL
  Filled 2019-12-15 (×4): qty 1

## 2019-12-15 MED ORDER — FOLIC ACID 1 MG PO TABS
1.0000 mg | ORAL_TABLET | Freq: Every day | ORAL | Status: DC
  Administered 2019-12-16 – 2019-12-19 (×4): 1 mg via ORAL
  Filled 2019-12-15 (×4): qty 1

## 2019-12-15 MED ORDER — PANTOPRAZOLE SODIUM 40 MG IV SOLR: 40.0000 mg | Freq: Two times a day (BID) | INTRAVENOUS | Status: DC

## 2019-12-15 MED ORDER — SODIUM CHLORIDE 0.9 % IV SOLN: INTRAVENOUS | Status: DC

## 2019-12-15 MED ORDER — LORAZEPAM 1 MG PO TABS: 1.0000 mg | ORAL_TABLET | ORAL | Status: AC | PRN

## 2019-12-15 MED ORDER — POLYETHYLENE GLYCOL 3350 17 G PO PACK: 17.0000 g | PACK | Freq: Every day | ORAL | Status: DC | PRN

## 2019-12-15 MED ORDER — LORAZEPAM 2 MG/ML IJ SOLN
1.0000 mg | Freq: Once | INTRAMUSCULAR | Status: AC
  Administered 2019-12-15: 1 mg via INTRAVENOUS
  Filled 2019-12-15: qty 1

## 2019-12-15 MED ORDER — ONDANSETRON HCL 4 MG PO TABS: 4.0000 mg | ORAL_TABLET | Freq: Four times a day (QID) | ORAL | Status: DC | PRN

## 2019-12-15 MED ORDER — SOFOSBUVIR-VELPATASVIR 400-100 MG PO TABS: 1.0000 | ORAL_TABLET | Freq: Every day | ORAL | Status: DC

## 2019-12-15 MED ORDER — CLONAZEPAM 1 MG PO TABS
1.0000 mg | ORAL_TABLET | Freq: Two times a day (BID) | ORAL | Status: DC | PRN
  Administered 2019-12-16 – 2019-12-19 (×5): 1 mg via ORAL
  Filled 2019-12-15 (×5): qty 1

## 2019-12-15 MED ORDER — ARIPIPRAZOLE 5 MG PO TABS: 5.0000 mg | ORAL_TABLET | Freq: Every day | ORAL | Status: DC

## 2019-12-15 MED ORDER — SODIUM CHLORIDE 0.9 % IV SOLN
50.0000 ug/h | INTRAVENOUS | Status: DC
  Administered 2019-12-15: 50 ug/h via INTRAVENOUS
  Administered 2019-12-16: 20 ug/h via INTRAVENOUS
  Administered 2019-12-16 – 2019-12-17 (×2): 50 ug/h via INTRAVENOUS
  Filled 2019-12-15 (×7): qty 1

## 2019-12-15 MED ORDER — ACETAMINOPHEN 500 MG PO TABS
1000.0000 mg | ORAL_TABLET | Freq: Once | ORAL | Status: AC
  Administered 2019-12-15: 1000 mg via ORAL
  Filled 2019-12-15: qty 2

## 2019-12-15 MED ORDER — ADULT MULTIVITAMIN W/MINERALS CH
1.0000 | ORAL_TABLET | Freq: Every day | ORAL | Status: DC
  Administered 2019-12-16 – 2019-12-19 (×4): 1 via ORAL
  Filled 2019-12-15 (×4): qty 1

## 2019-12-15 MED ORDER — HYDROXYZINE HCL 25 MG PO TABS: 25.0000 mg | ORAL_TABLET | Freq: Four times a day (QID) | ORAL | Status: DC | PRN

## 2019-12-15 MED ORDER — LEVETIRACETAM 500 MG PO TABS
500.0000 mg | ORAL_TABLET | Freq: Two times a day (BID) | ORAL | Status: DC
  Administered 2019-12-15 – 2019-12-19 (×8): 500 mg via ORAL
  Filled 2019-12-15 (×8): qty 1

## 2019-12-15 NOTE — ED Triage Notes (Addendum)
Patient reports having a physical yesterday at PCP.   Patient called today to come to ED due to platelets-22 and patient also diagnosed with UTI yesterday.   C/O dizziness X2days and pain with urination with right flank pain that is getting better. Patient was prescribed antibiotics but patient has not got them yet.   Patient reports falling yesterday from being dizzy and bruised right leg.    A/ox4 Ambulatory in triage

## 2019-12-15 NOTE — ED Provider Notes (Signed)
[facility] Emergency Department Provider Note MRN:  [id number]  Arrival date & time: 12/15/19     Chief Complaint   Abnormal Lab and Dizziness   History of Present Illness   Briana Sweeney is a 49 y.o. year-old female with a history of alcoholic cirrhosis, thrombocytopenia presenting to the ED with chief complaint of abnormal lab.  Patient is endorsing headache, dizziness, unsteady gait, lightheadedness, bleeding with toothbrushing, dark stools for the past few days.  Was told that her platelets are low and that she needs to come to the emergency department for transfusion.  Denies fever, no chest pain or shortness of breath, no abdominal pain.  Review of Systems  A complete 10 system review of systems was obtained and all systems are negative except as noted in the HPI and PMH.   Patient's Health History    Past Medical History:  Diagnosis Date  . Alcohol use   . Anxiety   . Bipolar affective disorder ([place])    With anxiety features  . Bunionette of right foot 11/2019  . 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 Depression   . 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 ETOHism ([place])   . GERD (gastroesophageal reflux disease)   . Hematemesis 02/10/2018  . Hepatitis C 2018   hepatitis c and alcohol related hepatitis  . Heroin abuse ([place])   . History of blood transfusion    "blood doesn't clot; I fell down and had to have a transfusion"  . History of kidney stones   . History of thrombocytopenia   . Menopause 2016  . Migraine    "when I get really stressed" (09/01/2017)  . S/P foot surgery, right 11/2019  . Schizophrenia ([place])   . Seizures ([place])    "when I run out of my RX; lots recently" (09/01/2017)    Past Surgical History:  Procedure Laterality Date  . ESOPHAGOGASTRODUODENOSCOPY N/A 09/03/2017   Procedure: ESOPHAGOGASTRODUODENOSCOPY (EGD);  Surgeon: Doran Stabler, MD;  Location: [facility];  Service:  Gastroenterology;  Laterality: N/A;  . FINGER FRACTURE SURGERY Left    "shattered my pinky"  . FRACTURE SURGERY    . I & D EXTREMITY Left 09/18/2018   Procedure: IRRIGATION AND DEBRIDEMENT EXTREMITY;  Surgeon: Leanora Cover, MD;  [facility];  Service: Orthopedics;  Laterality: Left;  . IR PARACENTESIS  07/23/2017  . IR PARACENTESIS  07/2017   "did it twice in the same week" (09/01/2017)  . SHOULDER OPEN ROTATOR CUFF REPAIR Right   . TOOTH EXTRACTION  08/2019  . TUBAL LIGATION    . VAGINAL HYSTERECTOMY      Family History  Problem Relation Age of Onset  . Lung cancer Mother 28  . Alcohol abuse Mother   . Throat cancer Father 53    Social History   Socioeconomic History  . Marital status: Divorced    Spouse name: Not on file  . Number of children: 2  . Years of education: Not on file  . Highest education level: Not on file  Occupational History  . Occupation: applying for disability  Tobacco Use  . Smoking status: Never Smoker  . Smokeless tobacco: Never Used  Substance and Sexual Activity  . Alcohol use: Yes    Alcohol/week: 63.0 standard drinks    Types: 63 Cans of beer per week    Comment: weekly "I have cut back"; 4 days 4 vs 12 pack   .  Drug use: Yes    Types: Marijuana    Comment: cocaiane not currently  . Sexual activity: Yes  Other Topics Concern  . Not on file  Social History Narrative   She moved with a boyfriend to [place] and was followed at [facility].  He died of a massive heart attack in 8/18, per her report, and so she moved back to [place] and is living with a friend.      Right handed   2 story home    Lives alone 11/09/19   Social Determinants of Health   Financial Resource Strain:   . Difficulty of Paying Living Expenses:   Food Insecurity:   . Worried About Charity fundraiser in the Last Year:   . Arboriculturist in the Last Year:   Transportation Needs:   . Film/video editor (Medical):   [name] Kitchen Lack of Transportation  (Non-Medical):   Physical Activity:   . Days of Exercise per Week:   . Minutes of Exercise per Session:   Stress:   . Feeling of Stress :   Social Connections:   . Frequency of Communication with Friends and Family:   . Frequency of Social Gatherings with Friends and Family:   . Attends Religious Services:   . Active Member of Clubs or Organizations:   . Attends Archivist Meetings:   [name] Kitchen Marital Status:   Intimate Partner Violence:   . Fear of Current or Ex-Partner:   . Emotionally Abused:   [name] Kitchen Physically Abused:   . Sexually Abused:      Physical Exam   Vitals:   12/15/19 1647 12/15/19 1648  BP:    Pulse: 74 78  Resp: 17 15  Temp:    SpO2: 98% 99%    CONSTITUTIONAL: Chronically ill-appearing, NAD NEURO:  Alert and oriented x 3, no focal deficits EYES:  eyes equal and reactive ENT/NECK:  no LAD, no JVD CARDIO: Regular rate, well-perfused, normal S1 and S2 PULM:  CTAB no wheezing or rhonchi GI/GU:  normal bowel sounds, non-distended, non-tender MSK/SPINE:  No gross deformities, no edema SKIN:  no rash, atraumatic PSYCH:  Appropriate speech and behavior  *Additional and/or pertinent findings included in MDM below  Diagnostic and Interventional Summary    EKG Interpretation  Date/Time:    Ventricular Rate:    PR Interval:    QRS Duration:   QT Interval:    QTC Calculation:   R Axis:     Text Interpretation:        Labs Reviewed  CBC - Abnormal; Notable for the following components:      Result Value   WBC 3.0 (*)    Platelets 23 (*)    All other components within normal limits  COMPREHENSIVE METABOLIC PANEL - Abnormal; Notable for the following components:   Calcium 8.8 (*)    AST 57 (*)    Alkaline Phosphatase 180 (*)    All other components within normal limits  SARS CORONAVIRUS 2 (TAT 6-24 HRS)  PROTIME-INR  APTT  I-STAT BETA HCG BLOOD, ED ([facility], [facility], [facility] ONLY)  TYPE AND SCREEN  PREPARE PLATELET PHERESIS    CT Head Wo Contrast  Final  Result      Medications  0.9 %  sodium chloride infusion (has no administration in time range)  acetaminophen (TYLENOL) tablet 1,000 mg (has no administration in time range)  diphenhydrAMINE (BENADRYL) injection 25 mg (has no administration in time range)  LORazepam (ATIVAN) injection  1 mg (has no administration in time range)  sodium chloride 0.9 % bolus 1,000 mL (0 mLs Intravenous Stopped 12/15/19 1531)     Procedures  /  Critical Care .Critical Care Performed by: Maudie Flakes, MD Authorized by: Maudie Flakes, MD   Critical care provider statement:    Critical care time (minutes):  45   Critical care was necessary to treat or prevent imminent or life-threatening deterioration of the following conditions: Thrombocytopenia with bleeding requiring transfusion.   Critical care was time spent personally by me on the following activities:  Discussions with consultants, evaluation of patient's response to treatment, examination of patient, ordering and performing treatments and interventions, ordering and review of laboratory studies, ordering and review of radiographic studies, pulse oximetry, re-evaluation of patient's condition, obtaining history from patient or surrogate and review of old charts    ED Course and Medical Decision Making  I have reviewed the triage vital signs, the nursing notes, and pertinent available records from the EMR.  Listed above are laboratory and imaging tests that I personally ordered, reviewed, and interpreted and then considered in my medical decision making (see below for details).      Concern for thrombocytopenia with bleeding, reportedly patient's hematologist has requested that she be transfused 2 units and discharged, however patient is complaining of headache and dark stools and may need work-up for these issues.  Awaiting labs, CT head, will attempt to directly discuss case with hematology.  Discussed case with primary care provider Kathe Becton who is very familiar with patient, who has had numerous platelet transfusions in the past.  Patient began experiencing some shaking chills during the transfusion.  The platelet transfusion was paused, patient to be given Tylenol, Benadryl, will also provide 1 mg Ativan given that her last drink was this morning and withdrawal could be contributing.  Unclear if patient is actively bleeding, no blood in the mouth, Hemoccult attempt revealed no stool or blood in the rectal vault.  Will admit to hospitalist service for observation and further management.  Barth Kirks. Sedonia Small, [place] [email or web address]  Final Clinical Impressions(s) / ED Diagnoses     ICD-10-CM   1. Thrombocytopenia ([place])  D69.6   2. Alcoholic cirrhosis of liver without ascites ([place])  K70.30     ED Discharge Orders    None       Discharge Instructions Discussed with and Provided to Patient:   Discharge Instructions   None       Maudie Flakes, MD 12/15/19 1655

## 2019-12-15 NOTE — H&P (Signed)
History and Physical        Hospital Admission Note Date: 12/15/2019  Patient name: Briana Sweeney Medical record number: [id number] Date of birth: 1971-02-25 Age: 49 y.o. Gender: female  PCP: [name], FNP    Patient coming from: Home  I have reviewed all records in the [facility].    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  Patient reported she went to home PCP for normal physical yesterday, and CBC was drawn, platelets were low,  patient was recommended to come to the ED for transfusion. No hematemesis, no hematuria.  Reviewed patient's chart, she had been complaining of right foot pain and had been taking ibuprofen.  Patient reports dysuria, difficulty urination for last 5 days but no fevers or chills or abdominal pain.  Also reports, generalized weakness, falling, dizziness. Patient reports that she has received platelet transfusion in the past for thrombocytopenia and had a reaction in the past to the transfusion as well.     ED work-up/course:  In ED, AST 57 ALT 29, CBC WBCs 3.0, hemoglobin 12.6, hematocrit 39.3, platelets 23,000, platelets were 22K on 4/5  In ED, patient was started on platelets transfusion however started having violent shaking, chest pain and general body aches during transfusion.  Platelets were stopped and patient was given Ativan, Benadryl and Tylenol.  Blood bank was notified.   Review of Systems: Positives marked in 'bold' Constitutional: Denies fever, chills, diaphoresis, poor appetite and fatigue.   HEENT: Denies photophobia, eye pain, redness, hearing loss, ear pain, congestion, sore throat, rhinorrhea, sneezing, mouth sores, trouble swallowing, neck pain, neck stiffness and tinnitus.   Respiratory: Denies SOB, DOE, cough, chest tightness,  and wheezing.   Cardiovascular: Complains of chest pain during platelets transfusion, currently improved Gastrointestinal please see HPI Genitourinary: Please see HPI Musculoskeletal: Denies myalgias, back pain, joint swelling, arthralgias and gait problem.  Skin: Denies pallor, rash and wound.  Neurological: Has a history of seizures, has been complaining of generalized weakness, dizziness Hematological: Denies adenopathy. Easy bruising, personal or family bleeding history  Psychiatric/Behavioral: Denies suicidal ideation, mood changes, confusion, nervousness, sleep disturbance and agitation  Past Medical History: Past Medical History:  Diagnosis Date  . Alcohol use   . Anxiety   . Bipolar affective disorder ([place])    With anxiety features  . Bunionette of right foot 11/2019  . Cirrhosis of liver ([place])    Due to alcohol and hepatitis C  . Depression   . 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 ETOHism ([place])   . GERD (gastroesophageal reflux disease)   . Hematemesis 02/10/2018  . Hepatitis C 2018   hepatitis c and alcohol related hepatitis  . Heroin abuse ([place])   . History of blood transfusion    "blood doesn't clot; I fell down and had to have a transfusion"  .  History of kidney stones   . History of thrombocytopenia   . Menopause 2016  . Migraine    "when I get really stressed" (09/01/2017)  . S/P foot surgery, right 11/2019  . Schizophrenia ([place])   . Seizures ([place])    "when I run out of my RX; lots recently" (09/01/2017)    Past Surgical History:  Procedure Laterality Date  . ESOPHAGOGASTRODUODENOSCOPY N/A 09/03/2017   Procedure: ESOPHAGOGASTRODUODENOSCOPY (EGD);  Surgeon: Doran Stabler, MD;  Location: [facility];   Service: Gastroenterology;  Laterality: N/A;  . FINGER FRACTURE SURGERY Left    "shattered my pinky"  . FRACTURE SURGERY    . I & D EXTREMITY Left 09/18/2018   Procedure: IRRIGATION AND DEBRIDEMENT EXTREMITY;  Surgeon: Leanora Cover, MD;  Location: [facility];  Service: Orthopedics;  Laterality: Left;  . IR PARACENTESIS  07/23/2017  . IR PARACENTESIS  07/2017   "did it twice in the same week" (09/01/2017)  . SHOULDER OPEN ROTATOR CUFF REPAIR Right   . TOOTH EXTRACTION  08/2019  . TUBAL LIGATION    . VAGINAL HYSTERECTOMY      Medications: Prior to Admission medications   Medication Sig Start Date End Date Taking? Authorizing Provider  albuterol (VENTOLIN HFA) 108 (90 Base) MCG/ACT inhaler Inhale 2 puffs into the lungs every 6 (six) hours as needed for wheezing or shortness of breath. Patient not taking: Reported on 12/13/2019 09/08/19   [name], FNP  alum & mag hydroxide-simeth (MAALOX/MYLANTA) 200-200-20 MG/5ML suspension Take 15 mLs by mouth every 4 (four) hours as needed for indigestion or heartburn. 04/13/19   Hosie Poisson, MD  amLODipine (NORVASC) 5 MG tablet Take 1 tablet (5 mg total) by mouth daily. 08/26/19   [name], FNP  ARIPiprazole (ABILIFY) 5 MG tablet TAKE 1 TABLET BY MOUTH ONCE A DAY 11/09/19   Pucilowski, Olgierd A, MD  cephALEXin (KEFLEX) 500 MG capsule Take 1 capsule (500 mg total) by mouth 3 (three) times daily. Patient not taking: Reported on 11/15/2019 09/30/19   Edrick Kins, DPM  clonazePAM (KLONOPIN) 1 MG tablet Take 1 tablet (1 mg total) by mouth 2 (two) times daily as needed for anxiety. 11/15/19 01/14/20  Pucilowski, Marchia Bond, MD  FLUoxetine (PROZAC) 40 MG capsule Take 1 capsule (40 mg total) by mouth daily. 11/15/19   Pucilowski, Marchia Bond, MD  folic acid (FOLVITE) 1 MG tablet Take 1 tablet (1 mg total) by mouth daily. 04/13/19   Hosie Poisson, MD  furosemide (LASIX) 40 MG tablet Take 1 tablet (40 mg total) by mouth daily. 12/13/19 08/09/20  [name], FNP   gabapentin (NEURONTIN) 300 MG capsule Take 2 capsules 2 times a day. Patient taking differently: Take 600 mg by mouth 2 (two) times daily.  08/11/19   [name], FNP  hydrOXYzine (ATARAX/VISTARIL) 25 MG tablet Take 1 tablet (25 mg total) by mouth every 6 (six) hours as needed for itching. 12/13/19   [name], FNP  ibuprofen (ADVIL) 200 MG tablet Take 200 mg by mouth every 6 (six) hours as needed for moderate pain.    [provider]  ibuprofen (ADVIL) 800 MG tablet Take 1 tablet (800 mg total) by mouth every 8 (eight) hours as needed. 12/14/19   Edrick Kins, DPM  lactulose (CHRONULAC) 10 GM/15ML solution Take 30 mLs (20 g total) by mouth 3 (three) times daily. Goal to have 2-3 BMs daily Patient taking differently: Take 20 g by mouth 3 (three) times  daily as needed for moderate constipation. Goal to have 2-3 BMs daily 04/13/19   Hosie Poisson, MD  levETIRAcetam (KEPPRA) 500 MG tablet Take 1 tablet (500 mg total) by mouth 2 (two) times daily. 11/09/19   Cameron Sprang, MD  mirtazapine (REMERON) 30 MG tablet Take 1 tablet (30 mg total) by mouth at bedtime. 11/15/19 01/14/20  Pucilowski, Marchia Bond, MD  Multiple Vitamin (MULTIVITAMIN WITH MINERALS) TABS tablet Take 1 tablet by mouth daily. 04/13/19   Hosie Poisson, MD  polyethylene glycol (MIRALAX / GLYCOLAX) 17 g packet Take 17 g by mouth 2 (two) times daily. Patient taking differently: Take 17 g by mouth daily as needed for mild constipation.  04/13/19   Hosie Poisson, MD  Sofosbuvir-Velpatasvir (EPCLUSA) 400-100 MG TABS Take 1 tablet by mouth daily. Take 1 tablet by mouth daily. 07/12/19   [place], FNP  thiamine 100 MG tablet Take 1 tablet (100 mg total) by mouth daily. 04/14/19   Hosie Poisson, MD    Allergies:  No Known Allergies  Social History:  reports that she has never smoked. She has never used smokeless tobacco. She reports current alcohol use of about 63.0 standard drinks of alcohol per week. She reports current drug  use. Drug: Marijuana.  Family History: Family History  Problem Relation Age of Onset  . Lung cancer Mother 46  . Alcohol abuse Mother   . Throat cancer Father 13    Physical Exam: Blood pressure 100/75, pulse 93, temperature 97.9 F (36.6 C), temperature source Oral, resp. rate (!) 21, height 5' (1.524 m), weight 64.4 kg, SpO2 96 %. General: Alert, awake, oriented x3, in no acute distress.,  Ill-appearing Eyes: pink conjunctiva,anicteric sclera, pupils equal and reactive to light and accomodation, HEENT: normocephalic, atraumatic, oropharynx clear Neck: supple, no masses or lymphadenopathy, no goiter, no bruits, no JVD CVS: Regular rate and rhythm, without murmurs, rubs or gallops. No lower extremity edema Resp : Clear to auscultation bilaterally, no wheezing, rales or rhonchi. GI : Soft, nontender, nondistended, positive bowel sounds, no masses. Musculoskeletal: No clubbing or cyanosis, positive pedal pulses. No contracture. ROM intact  Neuro: Grossly intact, no focal neurological deficits, strength 5/5 upper and lower extremities bilaterally Psych: alert and oriented x 3, normal mood and affect Skin: no rashes or lesions, warm and dry   LABS on Admission: I have personally reviewed all the labs and imagings below    Basic Metabolic Panel: Recent Labs  Lab 12/13/19 1603 12/15/19 1332  NA 140 140  K 4.1 3.8  CL 106 106  CO2 20 23  GLUCOSE 98 93  BUN 6 8  CREATININE 0.66 0.51  CALCIUM 8.9 8.8*   Liver Function Tests: Recent Labs  Lab 12/13/19 1603 12/15/19 1332  AST 56* 57*  ALT 23 29  ALKPHOS 241* 180*  BILITOT 1.3* 1.0  PROT 7.5 7.4  ALBUMIN 4.0 3.6   No results for input(s): LIPASE, AMYLASE in the last 168 hours. No results for input(s): AMMONIA in the last 168 hours. CBC: Recent Labs  Lab 12/13/19 1603 12/15/19 1332  WBC 3.4 3.0*  NEUTROABS 2.3  --   HGB 13.9 12.6  HCT 40.0 39.3  MCV 89 93.6  PLT 22* 23*   Cardiac Enzymes: No results for  input(s): CKTOTAL, CKMB, CKMBINDEX, TROPONINI in the last 168 hours. BNP: Invalid input(s): POCBNP CBG: No results for input(s): GLUCAP in the last 168 hours.  Radiological Exams on Admission:  CT Head Wo Contrast  Result Date: 12/15/2019  CLINICAL DATA:  Headache and thrombocytopenia. EXAM: CT HEAD WITHOUT CONTRAST TECHNIQUE: Contiguous axial images were obtained from the base of the skull through the vertex without intravenous contrast. COMPARISON:  05/06/2019 FINDINGS: Brain: No evidence of acute infarction, hemorrhage, hydrocephalus, extra-axial collection or mass lesion/mass effect. Prominence of sulci and ventricles compatible with brain atrophy. Unchanged calcification in left cerebellar hemisphere. Vascular: No hyperdense vessel or unexpected calcification. Skull: Normal. Negative for fracture or focal lesion. Sinuses/Orbits: The paranasal sinuses and mastoid air cells are clear. Other: None. IMPRESSION: 1. No acute intracranial abnormalities Electronically Signed   By: Kerby Moors M.D.   On: 12/15/2019 16:29      EKG: Independently reviewed.  None new available  Assessment/Plan Principal Problem:   Thrombocytopenia ([place]) with pancytopenia secondary to alcoholic cirrhosis of the liver -Presented with symptomatic thrombocytopenia with upper GI bleed with black tarry stools for 1 week, gum bleeding -Platelet transfusion was started in ED however patient developed sudden shaking with chills, chest pain and transfusion was stopped.  I discussed with Dr. Julien Nordmann, on-call, recommended IV Solu-Medrol, Benadryl, Tylenol prior to the transfusion and to notify the blood bank.   Active Problems: Upper GI bleed, with known history of cirrhosis of the liver secondary to alcohol and hepatitis C, thrombocytopenia, pancytopenia -Patient reports black tarry stools for last 1 week, no hematemesis.  She also has been taking ibuprofen for her foot pain -Discussed with GI, Dr. Michail Sermon, will place on  n.p.o., reattempt platelet transfusion premedicated.  Given patient has a known history of cirrhosis, Dr. Michail Sermon recommended IV octreotide and Protonix, n.p.o. and will evaluate    UTI (urinary tract infection) -Patient complaining of dysuria, urinary hesitancy, for last 1 week -Obtain UA and culture, will place on IV Rocephin    Alcohol abuse, polysubstance abuse -Patient still drinks 6 packs of beer every day -Counseled strongly on alcohol cessation, placed on CIWA protocol with Ativan    Alcoholic cirrhosis of liver with ascites ([place]) -Continue lactulose, MiraLAX as needed  Chronic hepatitis C  -Patient follows [facility], will continue Epclusa  History of seizures Continue Keppra at outpatient dose  History of bipolar disorder Continue all psych medications   DVT prophylaxis: SCDs  CODE STATUS: Full CODE STATUS  Consults called: Gastroenterology, discussed with Dr. Michail Sermon.  Discussed about platelets transfusion reaction with Dr. Julien Nordmann  Family Communication: Admission, patients condition and plan of care including tests being ordered have been discussed with the patient who indicates understanding and agree with the plan and Code Status  Admission status: Observation, telemetry  The medical decision making on this patient was of high complexity and the patient is at high risk for clinical deterioration, therefore this is a level 3 admission.  Severity of Illness:      The appropriate patient status for this patient is OBSERVATION. Observation status is judged to be reasonable and necessary in order to provide the required intensity of service to ensure the patient's safety. The patient's presenting symptoms, physical exam findings, and initial radiographic and laboratory data in the context of their medical condition is felt to place them at decreased risk for further clinical deterioration. Furthermore, it is anticipated that the patient will be medically stable for  discharge from the hospital within 2 midnights of admission. The following factors support the patient status of observation.   " The patient's presenting symptoms include thrombocytopenia, GI bleed, UTI " The physical exam findings include dysuria, melanotic stools " The initial radiographic and laboratory data are pancytopenia,  thrombocytopenia     Time Spent on Admission: 65 mins       Rosemaria Inabinet M.D. [facility] 12/15/2019, 5:34 PM

## 2019-12-15 NOTE — ED Notes (Signed)
Date and time results received: 12/15/19 2:54 PM  (use smartphrase ".now" to insert current time)  Test: Platelet Critical Value: 23  Name of Provider Notified: [place]  Orders Received? Or Actions Taken?: Orders Received - See Orders for details

## 2019-12-15 NOTE — ED Notes (Signed)
Patient began violently shaking and c/o chest pain and generalized body aches around 1700. Dr. Sedonia Small notified, platelets stopped, with normal saline (with new IV tubing) running at 120 ml/hr. Patient given ativan, benadryl, and tylenol. Patient shakes have visibly decreased.   Blood bank called by Meghan, RN.   78 HR 99% RA 29 RR 100/75 98.7 temp

## 2019-12-15 NOTE — ED Notes (Signed)
Failed attempt to collect [facility]. RN notified.

## 2019-12-16 ENCOUNTER — Observation Stay (HOSPITAL_COMMUNITY): Payer: Medicaid Other

## 2019-12-16 DIAGNOSIS — F419 Anxiety disorder, unspecified: Secondary | ICD-10-CM | POA: Diagnosis present

## 2019-12-16 DIAGNOSIS — K7031 Alcoholic cirrhosis of liver with ascites: Secondary | ICD-10-CM | POA: Diagnosis present

## 2019-12-16 DIAGNOSIS — T8089XA Other complications following infusion, transfusion and therapeutic injection, initial encounter: Secondary | ICD-10-CM | POA: Diagnosis not present

## 2019-12-16 DIAGNOSIS — Z808 Family history of malignant neoplasm of other organs or systems: Secondary | ICD-10-CM | POA: Diagnosis not present

## 2019-12-16 DIAGNOSIS — Z20822 Contact with and (suspected) exposure to covid-19: Secondary | ICD-10-CM | POA: Diagnosis present

## 2019-12-16 DIAGNOSIS — Y907 Blood alcohol level of 200-239 mg/100 ml: Secondary | ICD-10-CM | POA: Diagnosis not present

## 2019-12-16 DIAGNOSIS — K921 Melena: Secondary | ICD-10-CM | POA: Diagnosis present

## 2019-12-16 DIAGNOSIS — F319 Bipolar disorder, unspecified: Secondary | ICD-10-CM | POA: Diagnosis present

## 2019-12-16 DIAGNOSIS — Z811 Family history of alcohol abuse and dependence: Secondary | ICD-10-CM | POA: Diagnosis not present

## 2019-12-16 DIAGNOSIS — F1010 Alcohol abuse, uncomplicated: Secondary | ICD-10-CM | POA: Diagnosis present

## 2019-12-16 DIAGNOSIS — F1210 Cannabis abuse, uncomplicated: Secondary | ICD-10-CM | POA: Diagnosis not present

## 2019-12-16 DIAGNOSIS — K7010 Alcoholic hepatitis without ascites: Secondary | ICD-10-CM | POA: Diagnosis present

## 2019-12-16 DIAGNOSIS — I8500 Esophageal varices without bleeding: Secondary | ICD-10-CM | POA: Diagnosis present

## 2019-12-16 DIAGNOSIS — F209 Schizophrenia, unspecified: Secondary | ICD-10-CM | POA: Diagnosis present

## 2019-12-16 DIAGNOSIS — R109 Unspecified abdominal pain: Secondary | ICD-10-CM | POA: Diagnosis not present

## 2019-12-16 DIAGNOSIS — K7030 Alcoholic cirrhosis of liver without ascites: Secondary | ICD-10-CM | POA: Diagnosis not present

## 2019-12-16 DIAGNOSIS — K3189 Other diseases of stomach and duodenum: Secondary | ICD-10-CM | POA: Diagnosis present

## 2019-12-16 DIAGNOSIS — Y92018 Other place in single-family (private) house as the place of occurrence of the external cause: Secondary | ICD-10-CM | POA: Diagnosis not present

## 2019-12-16 DIAGNOSIS — N3000 Acute cystitis without hematuria: Secondary | ICD-10-CM | POA: Diagnosis present

## 2019-12-16 DIAGNOSIS — I8510 Secondary esophageal varices without bleeding: Secondary | ICD-10-CM | POA: Diagnosis present

## 2019-12-16 DIAGNOSIS — F10920 Alcohol use, unspecified with intoxication, uncomplicated: Secondary | ICD-10-CM | POA: Diagnosis not present

## 2019-12-16 DIAGNOSIS — Z801 Family history of malignant neoplasm of trachea, bronchus and lung: Secondary | ICD-10-CM | POA: Diagnosis not present

## 2019-12-16 DIAGNOSIS — K219 Gastro-esophageal reflux disease without esophagitis: Secondary | ICD-10-CM | POA: Diagnosis present

## 2019-12-16 DIAGNOSIS — B182 Chronic viral hepatitis C: Secondary | ICD-10-CM | POA: Diagnosis present

## 2019-12-16 DIAGNOSIS — W108XXA Fall (on) (from) other stairs and steps, initial encounter: Secondary | ICD-10-CM | POA: Diagnosis not present

## 2019-12-16 DIAGNOSIS — D696 Thrombocytopenia, unspecified: Secondary | ICD-10-CM | POA: Diagnosis present

## 2019-12-16 DIAGNOSIS — R519 Headache, unspecified: Secondary | ICD-10-CM | POA: Diagnosis not present

## 2019-12-16 DIAGNOSIS — K068 Other specified disorders of gingiva and edentulous alveolar ridge: Secondary | ICD-10-CM | POA: Diagnosis present

## 2019-12-16 DIAGNOSIS — Y9389 Activity, other specified: Secondary | ICD-10-CM | POA: Diagnosis not present

## 2019-12-16 DIAGNOSIS — M542 Cervicalgia: Secondary | ICD-10-CM | POA: Diagnosis not present

## 2019-12-16 DIAGNOSIS — S0990XA Unspecified injury of head, initial encounter: Secondary | ICD-10-CM | POA: Diagnosis not present

## 2019-12-16 DIAGNOSIS — D61818 Other pancytopenia: Secondary | ICD-10-CM | POA: Diagnosis present

## 2019-12-16 DIAGNOSIS — R569 Unspecified convulsions: Secondary | ICD-10-CM | POA: Diagnosis present

## 2019-12-16 DIAGNOSIS — Y998 Other external cause status: Secondary | ICD-10-CM | POA: Diagnosis not present

## 2019-12-16 DIAGNOSIS — Z79899 Other long term (current) drug therapy: Secondary | ICD-10-CM | POA: Diagnosis not present

## 2019-12-16 DIAGNOSIS — N939 Abnormal uterine and vaginal bleeding, unspecified: Secondary | ICD-10-CM | POA: Diagnosis present

## 2019-12-16 LAB — BPAM PLATELET PHERESIS
Blood Product Expiration Date: 202104082359
Blood Product Expiration Date: 202104092359
ISSUE DATE / TIME: 202104071538
ISSUE DATE / TIME: 202104072309
Unit Type and Rh: 5100
Unit Type and Rh: 6200

## 2019-12-16 LAB — BASIC METABOLIC PANEL
Anion gap: 14 (ref 5–15)
BUN: 7 mg/dL (ref 6–20)
CO2: 21 mmol/L — ABNORMAL LOW (ref 22–32)
Calcium: 8.5 mg/dL — ABNORMAL LOW (ref 8.9–10.3)
Chloride: 101 mmol/L (ref 98–111)
Creatinine, Ser: 0.53 mg/dL (ref 0.44–1.00)
GFR calc Af Amer: >60 mL/min (ref >60)
GFR calc non Af Amer: >60 mL/min (ref >60)
Glucose, Bld: 203 mg/dL — ABNORMAL HIGH (ref 70–99)
Potassium: 3.9 mmol/L (ref 3.5–5.1)
Sodium: 136 mmol/L (ref 135–145)

## 2019-12-16 LAB — MAGNESIUM: Magnesium: 1.6 mg/dL — ABNORMAL LOW (ref 1.7–2.4)

## 2019-12-16 LAB — CBC
HCT: 39.1 % (ref 36.0–46.0)
Hemoglobin: 13 g/dL (ref 12.0–15.0)
MCH: 31.3 pg (ref 26.0–34.0)
MCHC: 33.2 g/dL (ref 30.0–36.0)
MCV: 94.2 fL (ref 80.0–100.0)
Platelets: 17 10*3/uL — CL (ref 150–400)
RBC: 4.15 MIL/uL (ref 3.87–5.11)
RDW: 15.6 % — ABNORMAL HIGH (ref 11.5–15.5)
WBC: 6.6 10*3/uL (ref 4.0–10.5)
nRBC: 0 % (ref 0.0–0.2)

## 2019-12-16 LAB — PREPARE PLATELET PHERESIS
Unit division: 0
Unit division: 0

## 2019-12-16 LAB — HIV ANTIBODY (ROUTINE TESTING W REFLEX): HIV Screen 4th Generation wRfx: NONREACTIVE

## 2019-12-16 LAB — SARS CORONAVIRUS 2 (TAT 6-24 HRS): SARS Coronavirus 2: NEGATIVE

## 2019-12-16 LAB — PHOSPHORUS: Phosphorus: 3.6 mg/dL (ref 2.5–4.6)

## 2019-12-16 LAB — TSH: TSH: 0.304 u[IU]/mL — ABNORMAL LOW (ref 0.350–4.500)

## 2019-12-16 MED ORDER — MAGNESIUM SULFATE 50 % IJ SOLN
3.0000 g | Freq: Once | INTRAVENOUS | Status: AC
  Administered 2019-12-16: 3 g via INTRAVENOUS
  Filled 2019-12-16: qty 6

## 2019-12-16 MED ORDER — ACETAMINOPHEN 325 MG PO TABS
650.0000 mg | ORAL_TABLET | Freq: Once | ORAL | Status: AC
  Administered 2019-12-17: 650 mg via ORAL
  Filled 2019-12-16: qty 2

## 2019-12-16 MED ORDER — METHYLPREDNISOLONE SODIUM SUCC 125 MG IJ SOLR
125.0000 mg | Freq: Once | INTRAMUSCULAR | Status: AC
  Administered 2019-12-17: 125 mg via INTRAVENOUS
  Filled 2019-12-16: qty 2

## 2019-12-16 MED ORDER — DIPHENHYDRAMINE HCL 50 MG/ML IJ SOLN
25.0000 mg | Freq: Once | INTRAMUSCULAR | Status: AC
  Administered 2019-12-17: 25 mg via INTRAVENOUS
  Filled 2019-12-16: qty 1

## 2019-12-16 MED ORDER — FLAVOXATE HCL 100 MG PO TABS
100.0000 mg | ORAL_TABLET | Freq: Three times a day (TID) | ORAL | Status: AC
  Administered 2019-12-16 – 2019-12-19 (×9): 100 mg via ORAL
  Filled 2019-12-16 (×9): qty 1

## 2019-12-16 MED ORDER — SODIUM CHLORIDE 0.9% IV SOLUTION: Freq: Once | INTRAVENOUS | Status: AC

## 2019-12-16 NOTE — Plan of Care (Signed)
  Problem: Education: Goal: Knowledge of General Education information will improve Description Including pain rating scale, medication(s)/side effects and non-pharmacologic comfort measures Outcome: Progressing   

## 2019-12-16 NOTE — Progress Notes (Signed)
[facility]                                                                              Patient Demographics  Briana Sweeney, is a 49 y.o. female, DOB - 1971/05/27, EEF:[id number]  Admit date - 12/15/2019   Admitting Physician Randi Poullard Krystal Eaton, MD  Outpatient Primary MD for the patient is [name], FNP  Outpatient specialists:   LOS - 0  days   Medical records reviewed and are as summarized below:    Chief Complaint  Patient presents with  . Abnormal Lab  . 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 Patient was sent to ED by PCP on noticing low platelets of 22K  ED work-up/course:  In ED, AST 57 ALT 29, CBC WBCs 3.0, hemoglobin 12.6, hematocrit 39.3, platelets 23,000, platelets were 22K on 4/5 In ED, had allergic reaction to platelet transfusion  Assessment & Plan    Principal Problem:   Acute on chronic thrombocytopenia (HCC) with pancytopenia secondary to alcoholic cirrhosis of the liver -Presented with symptomatic thrombocytopenia with upper GI bleed with black tarry stools for 1 week, gum bleeding -Platelet transfusion was started in ED on 4/7 however patient developed sudden shaking with chills, chest pain and transfusion was stopped. Discussed with Dr. Julien Nordmann, on-call, recommended IV Solu-Medrol, Benadryl, Tylenol prior to the transfusion and to notify the blood bank. 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 No role of steroids. Otherwise nothing else to offer from hematology. -Patient has been seen by Dr. Marin Olp in the past.   - drop in the platelets today could be from hemodilution due to IV fluids and IV Rocephin.  DC IV fluids  Active Problems: Upper GI bleed, with known history of cirrhosis of the liver secondary to alcohol and hepatitis C, thrombocytopenia, pancytopenia -Patient reports black tarry stools for last 1 week, no hematemesis.  She also has been taking ibuprofen for her foot pain -Reports no BM since admission, hemoglobin stable, continue octreotide, PPI -Supportive care, absolute alcohol abstinence -Abdominal ultrasound showed cholelithiasis, mild splenomegaly, cirrhotic appearing liver without mass  Acute cystitis -Patient complaining of dysuria, urinary hesitancy, for last 1 week -UA and culture negative, will DC IV Rocephin -Placed on Urispas    Alcohol abuse, polysubstance abuse -Patient still drinks 6 packs of beer every day -Patient recommended on alcohol abstinence, placed on CIWA protocol with Ativan    Alcoholic cirrhosis of liver with ascites ([place]) -Continue lactulose, MiraLAX as needed  Chronic hepatitis C  -Patient follows [place] [facility], continue Epclusa  History of seizures Continue Keppra at outpatient dose  History  of bipolar disorder Continue all psych medications   Code Status: Full code DVT Prophylaxis: SCDs Family Communication: Discussed all imaging results, lab results, explained to the patient    Disposition Plan: Patient from home, anticipate discharge home in a.m. awaiting platelet transfusion today   Time Spent in minutes   35 minutes  Procedures:  None  Consultants:   Hematology Gastroenterology  Antimicrobials:   Anti-infectives (From admission, onward)   Start     Dose/Rate Route Frequency Ordered Stop   12/16/19 1200   Sofosbuvir-Velpatasvir 400-100 MG TABS 1 tablet    Note to Pharmacy: Take 1 tablet by mouth daily.     1 tablet Oral Daily 12/15/19 1920     12/15/19 1915  Sofosbuvir-Velpatasvir 400-100 MG TABS 1 tablet  Status:  Discontinued    Note to Pharmacy: Take 1 tablet by mouth daily.     1 tablet Oral Daily 12/15/19 1905 12/15/19 1920   12/15/19 1815  cefTRIAXone (ROCEPHIN) 1 g in sodium chloride 0.9 % 100 mL IVPB     1 g 200 mL/hr over 30 Minutes Intravenous Every 24 hours 12/15/19 1800            Medications  Scheduled Meds: . acetaminophen  650 mg Oral Once  . ARIPiprazole  5 mg Oral Daily  . diphenhydrAMINE  25 mg Intravenous Once  . FLUoxetine  40 mg Oral Daily  . folic acid  1 mg Oral Daily  . lactulose  20 g Oral BID  . levETIRAcetam  500 mg Oral BID  . methylPREDNISolone (SOLU-MEDROL) injection  125 mg Intravenous Once  . mirtazapine  30 mg Oral QHS  . multivitamin with minerals  1 tablet Oral Daily  . pantoprazole (PROTONIX) IV  40 mg Intravenous Q12H  . Sofosbuvir-Velpatasvir  1 tablet Oral Q1200  . thiamine  100 mg Oral Daily   Or  . thiamine  100 mg Intravenous Daily   Continuous Infusions: . sodium chloride 75 mL/hr at 12/16/19 0356  . cefTRIAXone (ROCEPHIN)  IV 1 g (12/15/19 2019)  . magnesium sulfate LVP 250-500 ml 3 g (12/16/19 0957)  . octreotide  (SANDOSTATIN)    IV infusion 50 mcg/hr (12/16/19 0355)   PRN Meds:.acetaminophen **OR** acetaminophen, clonazePAM, hydrOXYzine, LORazepam **OR** LORazepam, ondansetron **OR** ondansetron (ZOFRAN) IV, polyethylene glycol      Subjective:   Mozetta Murfin was seen and examined today.  No acute complaints, no fevers or chills.  No further bleeding from the gums or GI bleeding overnight.  Patient denies dizziness, chest pain, shortness of breath, abdominal pain, N/V/D/C, new weakness, numbess, tingling. No acute events overnight.    Objective:   Vitals:   12/15/19 2315 12/15/19 2330 12/16/19 0142 12/16/19 0559  BP:  119/83 126/84 (!) 137/91 130/88  Pulse: 74 79 87 65  Resp: _0 Temp: 97.7 F (36.5 C) 97.9 F (36.6 C) 98 F (36.7 C) 97.7 F (36.5 C)  TempSrc: Oral Oral Oral Oral  SpO2: 95% 95% 100% 94%  Weight:      Height:        Intake/Output Summary (Last 24 hours) at 12/16/2019 1246 Last data filed at 12/16/2019 0900 Gross per 24 hour  Intake 1710.06 ml  Output 1 ml  Net 1709.06 ml     Wt Readings from Last 3 Encounters:  12/15/19 65.9 kg  12/13/19 65.3 kg  11/09/19 66.7 kg     Exam  General: Alert and oriented x 3, NAD  Cardiovascular: S1 S2  auscultated, no murmurs, RRR  Respiratory: Clear to auscultation bilaterally, no wheezing, rales or rhonchi  Gastrointestinal: Soft, nontender, nondistended, + bowel sounds  Ext: no pedal edema bilaterally  Neuro: No new deficits  Musculoskeletal: No digital cyanosis, clubbing  Skin: No rashes  Psych: Normal affect and demeanor, alert and oriented x3    Data Reviewed:  I have personally reviewed following labs and imaging studies  Micro Results Recent Results (from the past 240 hour(s))  Urine Culture     Status: None   Collection Time: 12/13/19  3:55 PM   Specimen: Urine   UR  Result Value Ref Range Status   Urine Culture, Routine Final report  Final   Organism ID, Bacteria Comment  Final    Comment: Mixed urogenital flora 25,000-50,000 colony forming units per mL   Culture, blood (Routine X 2) w Reflex to ID Panel     Status: None (Preliminary result)   Collection Time: 12/15/19  6:28 PM   Specimen: BLOOD RIGHT HAND  Result Value Ref Range Status   Specimen Description   Final    BLOOD RIGHT HAND Performed at [facility] [address], [place], [place] [address]    Special Requests   Final    BOTTLES DRAWN AEROBIC AND ANAEROBIC Blood Culture adequate volume Performed at [facility] [address], [place], [place] [address]    Culture   Final    NO GROWTH < 12  HOURS Performed at [facility] [address], [place], [place] [address]    Report Status PENDING  Incomplete  SARS CORONAVIRUS 2 (TAT 6-24 HRS) Nasopharyngeal Urine, Clean Catch     Status: None   Collection Time: 12/15/19  6:41 PM   Specimen: Urine, Clean Catch; Nasopharyngeal  Result Value Ref Range Status   SARS Coronavirus 2 NEGATIVE NEGATIVE Final    Comment: (NOTE) SARS-CoV-2 target nucleic acids are NOT DETECTED. The SARS-CoV-2 RNA is generally detectable in upper and lower respiratory specimens during the acute phase of infection. Negative results do not preclude SARS-CoV-2 infection, do not rule out co-infections with other pathogens, and should not be used as the sole basis for treatment or other patient management decisions. Negative results must be combined with clinical observations, patient history, and epidemiological information. The expected result is Negative. Fact Sheet for Patients: SugarRoll.be Fact Sheet for Healthcare Providers: https://www.woods-mathews.com/ This test is not yet approved or cleared by the [place] FDA and  has been authorized for detection and/or diagnosis of SARS-CoV-2 by FDA under an Emergency Use Authorization (EUA). This EUA will remain  in effect (meaning this test can be used) for the duration of the COVID-19 declaration under Section 56 4(b)(1) of the Act, 21 U.S.C. section 360bbb-3(b)(1), unless the authorization is terminated or revoked sooner. Performed at [facility], [place] [address], [place], [place] [address]   Culture, blood (Routine X 2) w Reflex to ID Panel     Status: None (Preliminary result)   Collection Time: 12/15/19  8:24 PM   Specimen: BLOOD RIGHT HAND  Result Value Ref Range Status   Specimen Description   Final    BLOOD RIGHT HAND Performed at [facility] [address], [place], [place] [address]    Special Requests   Final     BOTTLES DRAWN AEROBIC AND ANAEROBIC Blood Culture adequate volume Performed at [facility] [address], [place], [place] [address]    Culture  Final    NO GROWTH < 12 HOURS Performed at [facility] [address], [place], [place] [address]    Report Status PENDING  Incomplete    Radiology Reports CT Head Wo Contrast  Result Date: 12/15/2019 CLINICAL DATA:  Headache and thrombocytopenia. EXAM: CT HEAD WITHOUT CONTRAST TECHNIQUE: Contiguous axial images were obtained from the base of the skull through the vertex without intravenous contrast. COMPARISON:  05/06/2019 FINDINGS: Brain: No evidence of acute infarction, hemorrhage, hydrocephalus, extra-axial collection or mass lesion/mass effect. Prominence of sulci and ventricles compatible with brain atrophy. Unchanged calcification in left cerebellar hemisphere. Vascular: No hyperdense vessel or unexpected calcification. Skull: Normal. Negative for fracture or focal lesion. Sinuses/Orbits: The paranasal sinuses and mastoid air cells are clear. Other: None. IMPRESSION: 1. No acute intracranial abnormalities Electronically Signed   By: Kerby Moors M.D.   On: 12/15/2019 16:29   US Abdomen Complete  Result Date: 12/16/2019 CLINICAL DATA:  Alcoholic cirrhosis, thrombocytopenia, esophageal varices, GERD, kidney stones, substance abuse EXAM: ABDOMEN ULTRASOUND COMPLETE COMPARISON:  04/11/2019 FINDINGS: Gallbladder: 2.3 cm diameter gallstone in gallbladder. Gallbladder wall appears mildly thickened. Minimal pericholecystic fluid. No sonographic Murphy sign. Common bile duct: Diameter: 5 mm, normal Liver: Nodular liver with coarsened echogenicity consistent with cirrhosis. No focal hepatic mass or nodule. Portal vein is patent on color Doppler imaging with normal direction of blood flow towards the liver. IVC: Short segment of intrahepatic IVC normal appearance, remainder obscured by bowel gas Pancreas: Tail incompletely  visualized due to bowel gas, visualized portion normal appearance Spleen: Enlarged, 13.5 cm length with calculated volume of 526 mL. No focal lesion. Right Kidney: Length: 11.9 cm. Normal morphology without mass or hydronephrosis. Left Kidney: Length: 11.7 cm. Normal morphology without mass or hydronephrosis. Abdominal aorta: Predominately obscured by bowel gas Other findings: No free fluid IMPRESSION: Cirrhotic appearing liver without mass. Cholelithiasis. Mild splenomegaly. Electronically Signed   By: Lavonia Dana M.D.   On: 12/16/2019 12:03   DG Foot Complete Right  Result Date: 12/01/2019 Please see detailed radiograph report in office note.   Lab Data:  CBC: Recent Labs  Lab 12/13/19 1603 12/15/19 1332 12/15/19 1828 12/16/19 0443  WBC 3.4 3.0* 1.4* 6.6  NEUTROABS 2.3  --   --   --   HGB 13.9 12.6 12.4 13.0  HCT 40.0 39.3 38.3 39.1  MCV 89 93.6 94.1 94.2  PLT 22* 23* 19* 17*   Basic Metabolic Panel: Recent Labs  Lab 12/13/19 1603 12/15/19 1332 12/15/19 1828 12/16/19 0443  NA 140 140 139 136  K 4.1 3.8 3.5 3.9  CL 106 106 106 101  CO2 20 23 21* 21*  GLUCOSE 98 93 72 203*  BUN 6 8 5* 7  CREATININE 0.66 0.51 0.41* 0.53  CALCIUM 8.9 8.8* 8.3* 8.5*  MG  --   --  1.4* 1.6*  PHOS  --   --  2.3* 3.6   GFR: Estimated Creatinine Clearance: 73.7 mL/min (by C-G formula based on SCr of 0.53 mg/dL). Liver Function Tests: Recent Labs  Lab 12/13/19 1603 12/15/19 1332 12/15/19 1828  AST 56* 57* 54*  ALT _0 ALKPHOS 241* 180* 163*  BILITOT 1.3* 1.0 1.4*  PROT 7.5 7.4 6.8  ALBUMIN 4.0 3.6 3.2*   No results for input(s): LIPASE, AMYLASE in the last 168 hours. No results for input(s): AMMONIA in the last 168 hours. Coagulation Profile: Recent Labs  Lab 12/15/19 1332  INR 1.0   Cardiac Enzymes: No results  for input(s): CKTOTAL, CKMB, CKMBINDEX, TROPONINI in the last 168 hours. BNP (last 3 results) No results for input(s): PROBNP in the last 8760  hours. HbA1C: No results for input(s): HGBA1C in the last 72 hours. CBG: No results for input(s): GLUCAP in the last 168 hours. Lipid Profile: Recent Labs    12/13/19 1603  CHOL 213*  HDL 108  LDLCALC 90  TRIG 85  CHOLHDL 2.0   Thyroid Function Tests: Recent Labs    12/16/19 0443  TSH 0.304*   Anemia Panel: Recent Labs    12/13/19 1603  VITAMINB12 554   Urine analysis:    Component Value Date/Time   COLORURINE STRAW (A) 12/15/2019 1841   APPEARANCEUR CLEAR 12/15/2019 1841   LABSPEC 1.006 12/15/2019 1841   PHURINE 5.0 12/15/2019 1841   GLUCOSEU NEGATIVE 12/15/2019 1841   HGBUR NEGATIVE 12/15/2019 1841   BILIRUBINUR NEGATIVE 12/15/2019 1841   BILIRUBINUR Small 12/13/2019 1542   KETONESUR NEGATIVE 12/15/2019 1841   PROTEINUR NEGATIVE 12/15/2019 1841   UROBILINOGEN 0.2 12/13/2019 1542   UROBILINOGEN 0.2 12/02/2017 0836   NITRITE NEGATIVE 12/15/2019 1841   LEUKOCYTESUR NEGATIVE 12/15/2019 1841     Annalei Friesz M.D. [facility] 12/16/2019, 12:46 PM   Call night coverage person covering after 7pm

## 2019-12-16 NOTE — Plan of Care (Signed)
  Problem: Elimination: Goal: Will not experience complications related to urinary retention Outcome: Progressing   Problem: Safety: Goal: Ability to remain free from injury will improve Outcome: Progressing   Problem: Skin Integrity: Goal: Risk for impaired skin integrity will decrease Outcome: Progressing   

## 2019-12-16 NOTE — Progress Notes (Addendum)
Received called from lab platelet will not be available until later in shift to possible early am. MD made aware. SRP, RN

## 2019-12-16 NOTE — Consult Note (Addendum)
Referring Provider: Dr. Tana Coast Primary Care Physician:  [name], FNP Primary Gastroenterologist: Althia Forts  Reason for Consultation: Melena, cirrhosis  HPI: Briana Sweeney is a 49 y.o. female with history of hepatitis C, cirrhosis, alcohol use, grade 2 esophageal varices, portal gastropathy, thrombocytopenia, seizures presenting with thrombocytopenia and melena.  Patient presented to the ED after she was found to be thrombocytopenic per PCP work-up.  She reports melena for approximately 1 week.  States she has been having loose, black stools 2-3 times per day.  Her last bowel movement was yesterday.  She has had a small amount of bright red blood with wiping only.  She denies any nausea, vomiting, hematemesis.  She denies any abdominal pain.  She is also noticed bleeding gums last week. She denies family history of bleeding disorders.  Patient denies any NSAID or aspirin use, though H&P by hospitalist states that patient reported to her that she has been taking ibuprofen for foot pain.  She currently drinks at least 1 sixpack of beer per day.  She denies liquor use.  She denies current IV drug use or illicit drug use.  She denies current dysphagia, though states that she felt like it was difficult to swallow both solids and liquids last week.  This has since resolved.  She denies heartburn, decreased appetite, unintentional weight loss.  Patient states she takes lactulose as an outpatient for cirrhosis.  Hemoglobin is currently stable at 13.0.  However, she continues to have thrombocytopenia.  Platelets today were 17K, decreased from 23K on arrival yesterday.  Records were reviewed.  Last endoscopy was 08/2017 and showed grade 2 esophageal varices and moderate portal gastropathy.   Past Medical History:  Diagnosis Date  . Alcohol use   . Anxiety   . Bipolar affective disorder ([place])    With anxiety features  . Bunionette of right foot 11/2019  . Cirrhosis of liver ([place])     Due to alcohol and hepatitis C  . Depression   . 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 ETOHism ([place])   . GERD (gastroesophageal reflux disease)   . Hematemesis 02/10/2018  . Hepatitis C 2018   hepatitis c and alcohol related hepatitis  . Heroin abuse ([place])   . History of blood transfusion    "blood doesn't clot; I fell down and had to have a transfusion"  . History of kidney stones   . History of thrombocytopenia   . Menopause 2016  . Migraine    "when I get really stressed" (09/01/2017)  . S/P foot surgery, right 11/2019  . Schizophrenia ([place])   . Seizures ([place])    "when I run out of my RX; lots recently" (09/01/2017)  . Thrombocytopenia ([place])     Past Surgical History:  Procedure Laterality Date  . ESOPHAGOGASTRODUODENOSCOPY N/A 09/03/2017   Procedure: ESOPHAGOGASTRODUODENOSCOPY (EGD);  Surgeon: Doran Stabler, MD;  Location: [facility];  Service: Gastroenterology;  Laterality: N/A;  . FINGER FRACTURE SURGERY Left    "shattered my pinky"  . FRACTURE SURGERY    . I & D EXTREMITY Left 09/18/2018   Procedure: IRRIGATION AND DEBRIDEMENT EXTREMITY;  Surgeon: Leanora Cover, MD;  Location: [facility];  Service: Orthopedics;  Laterality: Left;  . IR PARACENTESIS  07/23/2017  . IR PARACENTESIS  07/2017   "did it twice in the same week" (09/01/2017)  . SHOULDER OPEN ROTATOR CUFF REPAIR Right   . TOOTH EXTRACTION  08/2019  . TUBAL LIGATION    . VAGINAL HYSTERECTOMY  Prior to Admission medications   Medication Sig Start Date End Date Taking? Authorizing Provider  alum & mag hydroxide-simeth (MAALOX/MYLANTA) 200-200-20 MG/5ML suspension Take 15 mLs by mouth every 4 (four) hours as needed for indigestion or heartburn. 04/13/19  Yes Hosie Poisson, MD  amLODipine (NORVASC) 5 MG tablet Take 1 tablet (5 mg total) by mouth daily. 08/26/19  Yes [name], FNP  ARIPiprazole (ABILIFY) 5 MG tablet TAKE 1 TABLET BY MOUTH ONCE A DAY Patient taking differently: Take 5 mg by mouth  daily.  11/09/19  Yes Pucilowski, Olgierd A, MD  clonazePAM (KLONOPIN) 1 MG tablet Take 1 tablet (1 mg total) by mouth 2 (two) times daily as needed for anxiety. 11/15/19 01/14/20 Yes Pucilowski, Olgierd A, MD  FLUoxetine (PROZAC) 40 MG capsule Take 1 capsule (40 mg total) by mouth daily. 11/15/19  Yes Pucilowski, Olgierd A, MD  folic acid (FOLVITE) 1 MG tablet Take 1 tablet (1 mg total) by mouth daily. 04/13/19  Yes Hosie Poisson, MD  furosemide (LASIX) 40 MG tablet Take 1 tablet (40 mg total) by mouth daily. 12/13/19 08/09/20 Yes [name], FNP  gabapentin (NEURONTIN) 300 MG capsule Take 2 capsules 2 times a day. Patient taking differently: Take 600 mg by mouth 2 (two) times daily.  08/11/19  Yes [name], FNP  hydrOXYzine (ATARAX/VISTARIL) 25 MG tablet Take 1 tablet (25 mg total) by mouth every 6 (six) hours as needed for itching. 12/13/19  Yes [name], FNP  ibuprofen (ADVIL) 800 MG tablet Take 1 tablet (800 mg total) by mouth every 8 (eight) hours as needed. 12/14/19  Yes Edrick Kins, DPM  lactulose (CHRONULAC) 10 GM/15ML solution Take 30 mLs (20 g total) by mouth 3 (three) times daily. Goal to have 2-3 BMs daily Patient taking differently: Take 20 g by mouth 3 (three) times daily as needed for moderate constipation. Goal to have 2-3 BMs daily 04/13/19  Yes Hosie Poisson, MD  levETIRAcetam (KEPPRA) 500 MG tablet Take 1 tablet (500 mg total) by mouth 2 (two) times daily. 11/09/19  Yes Cameron Sprang, MD  mirtazapine (REMERON) 30 MG tablet Take 1 tablet (30 mg total) by mouth at bedtime. 11/15/19 01/14/20 Yes Pucilowski, Marchia Bond, MD  Multiple Vitamin (MULTIVITAMIN WITH MINERALS) TABS tablet Take 1 tablet by mouth daily. 04/13/19  Yes Hosie Poisson, MD  polyethylene glycol (MIRALAX / GLYCOLAX) 17 g packet Take 17 g by mouth 2 (two) times daily. Patient taking differently: Take 17 g by mouth daily as needed for mild constipation.  04/13/19  Yes Hosie Poisson, MD  Sofosbuvir-Velpatasvir (EPCLUSA)  400-100 MG TABS Take 1 tablet by mouth daily. Take 1 tablet by mouth daily. 07/12/19  Yes Golden Circle, FNP  albuterol (VENTOLIN HFA) 108 (90 Base) MCG/ACT inhaler Inhale 2 puffs into the lungs every 6 (six) hours as needed for wheezing or shortness of breath. Patient not taking: Reported on 12/13/2019 09/08/19   [name], FNP  cephALEXin (KEFLEX) 500 MG capsule Take 1 capsule (500 mg total) by mouth 3 (three) times daily. Patient not taking: Reported on 11/15/2019 09/30/19   Edrick Kins, DPM  thiamine 100 MG tablet Take 1 tablet (100 mg total) by mouth daily. Patient not taking: Reported on 12/15/2019 04/14/19   Hosie Poisson, MD    Scheduled Meds: . sodium chloride   Intravenous Once  . acetaminophen  650 mg Oral Once  . ARIPiprazole  5 mg Oral Daily  . diphenhydrAMINE  25 mg  Intravenous Once  . FLUoxetine  40 mg Oral Daily  . folic acid  1 mg Oral Daily  . lactulose  20 g Oral BID  . levETIRAcetam  500 mg Oral BID  . methylPREDNISolone (SOLU-MEDROL) injection  125 mg Intravenous Once  . mirtazapine  30 mg Oral QHS  . multivitamin with minerals  1 tablet Oral Daily  . pantoprazole (PROTONIX) IV  40 mg Intravenous Q12H  . Sofosbuvir-Velpatasvir  1 tablet Oral Q1200  . thiamine  100 mg Oral Daily   Or  . thiamine  100 mg Intravenous Daily   Continuous Infusions: . sodium chloride 75 mL/hr at 12/16/19 0356  . cefTRIAXone (ROCEPHIN)  IV 1 g (12/15/19 2019)  . magnesium sulfate LVP 250-500 ml    . octreotide  (SANDOSTATIN)    IV infusion 50 mcg/hr (12/16/19 0355)   PRN Meds:.acetaminophen **OR** acetaminophen, clonazePAM, hydrOXYzine, LORazepam **OR** LORazepam, ondansetron **OR** ondansetron (ZOFRAN) IV, polyethylene glycol  Allergies as of 12/15/2019 - Review Complete 12/15/2019  Allergen Reaction Noted  . Other  12/15/2019    Family History  Problem Relation Age of Onset  . Lung cancer Mother 47  . Alcohol abuse Mother   . Throat cancer Father 4    Social  History   Socioeconomic History  . Marital status: Divorced    Spouse name: Not on file  . Number of children: 2  . Years of education: Not on file  . Highest education level: Not on file  Occupational History  . Occupation: applying for disability  Tobacco Use  . Smoking status: Never Smoker  . Smokeless tobacco: Never Used  Substance and Sexual Activity  . Alcohol use: Yes    Alcohol/week: 63.0 standard drinks    Types: 63 Cans of beer per week    Comment: weekly "I have cut back"; 4 days 4 vs 12 pack   . Drug use: Yes    Types: Marijuana    Comment: cocaiane not currently  . Sexual activity: Yes  Other Topics Concern  . Not on file  Social History Narrative   She moved with a boyfriend to [place] and was followed at [facility].  He died of a massive heart attack in 8/18, per her report, and so she moved back to [place] and is living with a friend.      Right handed   2 story home    Lives alone 11/09/19   Social Determinants of Health   Financial Resource Strain:   . Difficulty of Paying Living Expenses:   Food Insecurity:   . Worried About Charity fundraiser in the Last Year:   . Arboriculturist in the Last Year:   Transportation Needs:   . Film/video editor (Medical):   [name] Kitchen Lack of Transportation (Non-Medical):   Physical Activity:   . Days of Exercise per Week:   . Minutes of Exercise per Session:   Stress:   . Feeling of Stress :   Social Connections:   . Frequency of Communication with Friends and Family:   . Frequency of Social Gatherings with Friends and Family:   . Attends Religious Services:   . Active Member of Clubs or Organizations:   . Attends Archivist Meetings:   [name] Kitchen Marital Status:   Intimate Partner Violence:   . Fear of Current or Ex-Partner:   . Emotionally Abused:   [name] Kitchen Physically Abused:   . Sexually Abused:     Review of Systems:  Review of Systems  Constitutional: Negative for chills and fever.  HENT:  Negative for hearing loss and nosebleeds.        Bleeding gums  Eyes: Negative for blurred vision and double vision.  Respiratory: Positive for shortness of breath. Negative for cough.   Cardiovascular: Negative for chest pain and palpitations.  Gastrointestinal: Positive for diarrhea and melena. Negative for abdominal pain, blood in stool, constipation, heartburn, nausea and vomiting.  Genitourinary: Positive for dysuria. Negative for hematuria.  Musculoskeletal: Negative for joint pain and myalgias.  Skin: Negative for itching and rash.  Neurological: Positive for seizures and weakness.  Endo/Heme/Allergies: Bruises/bleeds easily.  Psychiatric/Behavioral: Positive for depression and substance abuse (alcohol). The patient is not nervous/anxious.      Physical Exam: Vital signs: Vitals:   12/16/19 0142 12/16/19 0559  BP: (!) 137/91 130/88  Pulse: 87 65  Resp: 18 18  Temp: 98 F (36.7 C) 97.7 F (36.5 C)  SpO2: 100% 94%     Physical Exam  Constitutional: She appears well-developed and well-nourished. No distress.  HENT:  Head: Normocephalic and atraumatic.  Eyes: Conjunctivae and EOM are normal. No scleral icterus.  Cardiovascular: Normal rate, regular rhythm and normal heart sounds.  Pulmonary/Chest: Effort normal and breath sounds normal. No respiratory distress.  Abdominal: Soft. Bowel sounds are normal. She exhibits no distension and no mass. There is no abdominal tenderness. There is no rebound and no guarding.  Musculoskeletal:        General: No deformity or edema.     Cervical back: Normal range of motion and neck supple.  Neurological: She is alert. She displays no tremor.  Able to report location, month, and current president, but reported date as 2022.  Skin: Skin is warm and dry.  Psychiatric: She has a normal mood and affect. Her behavior is normal.     GI:  Lab Results: Recent Labs    12/15/19 1332 12/15/19 1828 12/16/19 0443  WBC 3.0* 1.4* 6.6  HGB  12.6 12.4 13.0  HCT 39.3 38.3 39.1  PLT 23* 19* 17*   BMET Recent Labs    12/15/19 1332 12/15/19 1828 12/16/19 0443  NA 140 139 136  K 3.8 3.5 3.9  CL 106 106 101  CO2 23 21* 21*  GLUCOSE 93 72 203*  BUN 8 5* 7  CREATININE 0.51 0.41* 0.53  CALCIUM 8.8* 8.3* 8.5*   LFT Recent Labs    12/15/19 1828  PROT 6.8  ALBUMIN 3.2*  AST 54*  ALT 24  ALKPHOS 163*  BILITOT 1.4*   PT/INR Recent Labs    12/15/19 1332  LABPROT 13.3  INR 1.0     Studies/Results: CT Head Wo Contrast  Result Date: 12/15/2019 CLINICAL DATA:  Headache and thrombocytopenia. EXAM: CT HEAD WITHOUT CONTRAST TECHNIQUE: Contiguous axial images were obtained from the base of the skull through the vertex without intravenous contrast. COMPARISON:  05/06/2019 FINDINGS: Brain: No evidence of acute infarction, hemorrhage, hydrocephalus, extra-axial collection or mass lesion/mass effect. Prominence of sulci and ventricles compatible with brain atrophy. Unchanged calcification in left cerebellar hemisphere. Vascular: No hyperdense vessel or unexpected calcification. Skull: Normal. Negative for fracture or focal lesion. Sinuses/Orbits: The paranasal sinuses and mastoid air cells are clear. Other: None. IMPRESSION: 1. No acute intracranial abnormalities Electronically Signed   By: Kerby Moors M.D.   On: 12/15/2019 16:29    Impression: -Melena, possibly from portal gastropathy and thrombocytopenia.  Patient is hemodynamically stable with stable Hgb and does not appear to be  actively bleeding.  Do not suspect variceal bleeding. -Cirrhosis (alcohol, Hepatitis C), on lactulose  -Alcohol use disorder -Chronic hepatitis C, currently on Epclusa -History of seizures, on Keppra  Plan: -Abdominal US to assess liver and spleen -Continue Protonix 40 mg IV twice daily -Discontinue Ocreotide after 48h -Continue lactulose 2 times daily. -Due to thrombocytopenia, we are not going to proceed with EGD at this time.  Consider EGD  once platelets >50K -Recommend hematology consultation -Thoroughly discussed complete alcohol cessation with patient. -OK from GI standpoint to have full liquid diet.  [facility] will follow.   LOS: 0 days   Salley Slaughter  PA-C 12/16/2019, 9:15 AM  Contact #  [phone]

## 2019-12-16 NOTE — TOC Progression Note (Signed)
Transition of Care [facility]) - Progression Note    Patient Details  Name: Briana Sweeney MRN: [id number] Date of Birth: 05-31-1971  Transition of [facility]) CM/SW Contact  Purcell Mouton, RN Phone Number: 12/16/2019, 3:27 PM  Clinical Narrative:    Pt admitted with thrombocytopenia, upper GI Bleed, ETOH, CIWA. Pt from home alone.     Expected Discharge Plan: Home/Self Care Barriers to Discharge: No Barriers Identified  Expected Discharge Plan and Services Expected Discharge Plan: Home/Self Care       Living arrangements for the past 2 months: Single Family Home                                       Social Determinants of Health (SDOH) Interventions    Readmission Risk Interventions Readmission Risk Prevention Plan 07/06/2019 04/12/2019  Transportation Screening Complete Complete  Medication Review Press photographer) Complete Complete  PCP or Specialist appointment within 3-5 days of discharge Complete Not Complete  PCP/Specialist Appt Not Complete comments - not ready for d/c  HRI or [place] Complete Complete  SW Recovery Care/Counseling Consult Complete Complete  Palliative Care Screening Not Applicable Not [place] Not Applicable Not Applicable

## 2019-12-16 NOTE — Progress Notes (Signed)
Blood bank called reports crossmatched platelets will be available this evening, MD made aware, will transfuse when available. SRP,RN

## 2019-12-17 ENCOUNTER — Encounter (HOSPITAL_COMMUNITY): Payer: Self-pay | Admitting: Internal Medicine

## 2019-12-17 LAB — BASIC METABOLIC PANEL
Anion gap: 8 (ref 5–15)
BUN: 13 mg/dL (ref 6–20)
CO2: 23 mmol/L (ref 22–32)
Calcium: 8.7 mg/dL — ABNORMAL LOW (ref 8.9–10.3)
Chloride: 106 mmol/L (ref 98–111)
Creatinine, Ser: 0.66 mg/dL (ref 0.44–1.00)
GFR calc Af Amer: >60 mL/min (ref >60)
GFR calc non Af Amer: >60 mL/min (ref >60)
Glucose, Bld: 143 mg/dL — ABNORMAL HIGH (ref 70–99)
Potassium: 4.6 mmol/L (ref 3.5–5.1)
Sodium: 137 mmol/L (ref 135–145)

## 2019-12-17 LAB — HEMOGLOBIN A1C
Hgb A1c MFr Bld: 5.1 % (ref 4.8–5.6)
Mean Plasma Glucose: 100 mg/dL

## 2019-12-17 LAB — CBC
HCT: 38.1 % (ref 36.0–46.0)
Hemoglobin: 12.7 g/dL (ref 12.0–15.0)
MCH: 30.9 pg (ref 26.0–34.0)
MCHC: 33.3 g/dL (ref 30.0–36.0)
MCV: 92.7 fL (ref 80.0–100.0)
Platelets: 21 10*3/uL — CL (ref 150–400)
RBC: 4.11 MIL/uL (ref 3.87–5.11)
RDW: 15.7 % — ABNORMAL HIGH (ref 11.5–15.5)
WBC: 3.8 10*3/uL — ABNORMAL LOW (ref 4.0–10.5)
nRBC: 0 % (ref 0.0–0.2)

## 2019-12-17 MED ORDER — DEXAMETHASONE SODIUM PHOSPHATE 4 MG/ML IJ SOLN: 24.0000 mg | INTRAMUSCULAR | Status: DC

## 2019-12-17 MED ORDER — IMMUNE GLOBULIN (HUMAN) 10 GM/100ML IV SOLN
70.0000 g | INTRAVENOUS | Status: AC
  Administered 2019-12-18: 70 g via INTRAVENOUS
  Filled 2019-12-17: qty 600
  Filled 2019-12-17: qty 700

## 2019-12-17 MED ORDER — SODIUM CHLORIDE 0.9 % IV SOLN
24.0000 mg | INTRAVENOUS | Status: AC
  Administered 2019-12-17 – 2019-12-18 (×2): 24 mg via INTRAVENOUS
  Filled 2019-12-17 (×2): qty 2.4

## 2019-12-17 MED ORDER — ROMIPLOSTIM 250 MCG ~~LOC~~ SOLR
2.0000 ug/kg | Freq: Once | SUBCUTANEOUS | Status: AC
  Administered 2019-12-17: 130 ug via SUBCUTANEOUS
  Filled 2019-12-17: qty 0.26

## 2019-12-17 NOTE — Progress Notes (Addendum)
[facility] Gastroenterology Progress Note  Briana Sweeney 49 y.o. [birth year]  CC: Cirrhosis, melena   Subjective: Patient reports episode of nausea with emesis after drinking chocolate milk and eating tomato soup this morning.  There was no sign of bleeding in the emesis per RN.  Nausea has since resolved.  She has not had any episodes of diarrhea or melena today.  Patient is not currently having any abdominal discomfort and otherwise feels well today.  Ultrasound yesterday showed cirrhotic liver without mass.  Mild splenomegaly.  Incidental cholelithiasis.  ROS : Review of Systems  Constitutional: Negative for chills and fever.  Gastrointestinal: Positive for nausea and vomiting. Negative for abdominal pain, blood in stool, constipation, diarrhea and melena.     Objective: Vital signs in last 24 hours: Vitals:   12/17/19 0533 12/17/19 1132  BP: (!) 130/95 120/88  Pulse: 71 64  Resp: 18 20  Temp: 97.7 F (36.5 C) 98.2 F (36.8 C)  SpO2: 95% 94%    Physical Exam: Physical Exam  Constitutional: She appears well-developed and well-nourished. No distress.  Eyes: Conjunctivae and EOM are normal. No scleral icterus.  Pulmonary/Chest: Effort normal. No respiratory distress.  Abdominal: Soft. Bowel sounds are normal. She exhibits no distension and no mass. There is no abdominal tenderness. There is no rebound and no guarding.    Lab Results: Recent Labs    12/15/19 1828 12/15/19 1828 12/16/19 0443 12/17/19 0428  NA 139   < > 136 137  K 3.5   < > 3.9 4.6  CL 106   < > 101 106  CO2 21*   < > 21* 23  GLUCOSE 72   < > 203* 143*  BUN 5*   < > 7 13  CREATININE 0.41*   < > 0.53 0.66  CALCIUM 8.3*   < > 8.5* 8.7*  MG 1.4*  --  1.6*  --   PHOS 2.3*  --  3.6  --    < > = values in this interval not displayed.   Recent Labs    12/15/19 1332 12/15/19 1828  AST 57* 54*  ALT 29 24  ALKPHOS 180* 163*  BILITOT 1.0 1.4*  PROT 7.4 6.8  ALBUMIN 3.6 3.2*   Recent Labs     12/16/19 0443 12/17/19 0428  WBC 6.6 3.8*  HGB 13.0 12.7  HCT 39.1 38.1  MCV 94.2 92.7  PLT 17* 21*   Recent Labs    12/15/19 1332  LABPROT 13.3  INR 1.0    Assessment: -Cirrhosis from chronic alcohol use and hepatitis C. Ultrasound yesterday showed cirrhotic liver without mass. -Chronic hepatitis C on Epclusa -Severe thrombocytopenia (21K), hematology following -Melena, likely from diffuse mucosal bleeding related to thrombocytopenia -Patient had one episode of emesis without any signs of gastric bleeding. -hemoglobin stable at 12.7  Plan: -OK from GI standpoint to initiate steroids.  Bleeding is most likely from thrombocytopenia. -Continue IV fluids and supportive care. -Continue IV PPI.   -OK to discontinue octreotide. -Advance diet as tolerated. -We rediscussed the importance of complete alcohol cessation.   [facility] will follow.   Salley Slaughter PA-C 12/17/2019, 12:04 PM  Contact #  [phone]

## 2019-12-17 NOTE — Consult Note (Addendum)
[place]  [phone] Fax:([phone]) [place]  Referring MD: Dr. 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  The patient was directed to the emergency room by an outside provider due to abnormal lab work.  She also had a 1 week history of bleeding gums and black tarry stools.  CBC on admission was significant for a WBC of 3.0 and platelet count of 23,000.  The patient has received a total of 3 units of platelets this admission.  I note that she had a transfusion reaction with one of the units of platelets.  CBC from this morning showed a WBC of 3.8 and platelet count of 21,000.  Her hemoglobin remains normal.  Review of the patient's lab work shows that she has chronic thrombocytopenia dating back to at least November 2018.  Her platelet count typically is in the 30-50,000 range.   When seen today, the patient reports that her bleeding gums have improved but she still has a small amount of bleeding.  She reports that her black and tarry stools have resolved.  Denies epistaxis, hemoptysis, hematemesis, hematuria.  Reports that she had a seizure while in the emergency room yesterday.  Denies headaches and dizziness.  Denies chest pain, shortness of breath, cough.  Denies abdominal pain, nausea, vomiting.  Notices some scattered bruises on her legs.  The patient reports that she continues to drink a sixpack of beer daily.  Hematology was asked see the patient to make recommendations regarding her thrombocytopenia.  Past Medical History:  Diagnosis Date  . Alcohol use   . Anxiety   . Bipolar affective disorder ([place])    With anxiety features  . Bunionette of right foot 11/2019  . Cirrhosis of liver ([place])    Due  to alcohol and hepatitis C  . Depression   . 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 ETOHism ([place])   . GERD (gastroesophageal reflux disease)   . Hematemesis 02/10/2018  . Hepatitis C 2018   hepatitis c and alcohol related hepatitis  . Heroin abuse ([place])   . History of blood transfusion    "blood doesn't clot; I fell down and had to have a transfusion"  . History of kidney stones   . History of thrombocytopenia   . Menopause 2016  . Migraine    "when I get really stressed" (09/01/2017)  . S/P foot surgery, right 11/2019  . Schizophrenia ([place])   . Seizures ([place])    "when I run out of my RX; lots recently" (09/01/2017)  . Thrombocytopenia ([place])   :    Past Surgical History:  Procedure Laterality Date  . ESOPHAGOGASTRODUODENOSCOPY N/A 09/03/2017   Procedure: ESOPHAGOGASTRODUODENOSCOPY (EGD);  Surgeon: Doran Stabler, MD;  Location: [facility];  Service: Gastroenterology;  Laterality: N/A;  . FINGER FRACTURE SURGERY Left    "shattered my pinky"  . FRACTURE SURGERY    . I & D EXTREMITY Left 09/18/2018   Procedure: IRRIGATION AND DEBRIDEMENT EXTREMITY;  Surgeon: Leanora Cover, MD;  Location: [facility];  Service: Orthopedics;  Laterality: Left;  . IR PARACENTESIS  07/23/2017  . IR PARACENTESIS  07/2017   "did it twice in the same week" (09/01/2017)  . SHOULDER OPEN ROTATOR CUFF REPAIR Right   . TOOTH EXTRACTION  08/2019  . TUBAL LIGATION    . VAGINAL HYSTERECTOMY    :   CURRENT MEDS: Current Facility-Administered Medications  Medication Dose Route Frequency Provider Last Rate Last Admin  . acetaminophen (TYLENOL) tablet 650 mg  650 mg Oral Q6H PRN Rai, Ripudeep K, MD       Or  . acetaminophen (TYLENOL) suppository 650 mg  650 mg Rectal Q6H PRN Rai, Ripudeep K, MD      . ARIPiprazole (ABILIFY) tablet 5 mg  5 mg Oral Daily Rai, Ripudeep K, MD   5 mg at 12/16/19 0950  . clonazePAM (KLONOPIN) tablet 1 mg  1 mg Oral BID PRN Rai, Ripudeep K, MD   1 mg at 12/16/19 1002  .  diphenhydrAMINE (BENADRYL) injection 25 mg  25 mg Intravenous Once Rai, Ripudeep K, MD      . flavoxATE (URISPAS) tablet 100 mg  100 mg Oral TID Rai, Ripudeep K, MD   100 mg at 12/16/19 2210  . FLUoxetine (PROZAC) capsule 40 mg  40 mg Oral Daily Rai, Ripudeep K, MD   40 mg at 12/16/19 0950  . folic acid (FOLVITE) tablet 1 mg  1 mg Oral Daily Rai, Ripudeep K, MD   1 mg at 12/16/19 0950  . hydrOXYzine (ATARAX/VISTARIL) tablet 25 mg  25 mg Oral Q6H PRN Rai, Ripudeep K, MD      . lactulose (CHRONULAC) 10 GM/15ML solution 20 g  20 g Oral BID Rai, Ripudeep K, MD   20 g at 12/16/19 2210  . levETIRAcetam (KEPPRA) tablet 500 mg  500 mg Oral BID Rai, Ripudeep K, MD   500 mg at 12/16/19 2210  . LORazepam (ATIVAN) tablet 1-4 mg  1-4 mg Oral Q1H PRN Rai, Vernelle Emerald, MD       Or  . LORazepam (ATIVAN) injection 1-4 mg  1-4 mg Intravenous Q1H PRN Rai, Ripudeep K, MD   1 mg at 12/16/19 2210  . methylPREDNISolone sodium succinate (SOLU-MEDROL) 125 mg/2 mL injection 125 mg  125 mg Intravenous Once Rai, Ripudeep K, MD      . mirtazapine (REMERON) tablet 30 mg  30 mg Oral QHS Rai, Ripudeep K, MD   30 mg at 12/16/19 2210  . multivitamin with minerals tablet 1 tablet  1 tablet Oral Daily Rai, Ripudeep K, MD   1 tablet at 12/16/19 0950  . octreotide (SANDOSTATIN) 500 mcg in sodium chloride 0.9 % 250 mL (2 mcg/mL) infusion  50 mcg/hr Intravenous Continuous Rai, Ripudeep K, MD 25 mL/hr at 12/17/19 0344 50 mcg/hr at 12/17/19 0344  . ondansetron (ZOFRAN) tablet 4 mg  4 mg Oral Q6H PRN Rai, Ripudeep K, MD       Or  . ondansetron (ZOFRAN) injection 4 mg  4 mg Intravenous Q6H PRN Rai, Ripudeep K, MD      . pantoprazole (PROTONIX) injection 40 mg  40 mg Intravenous Q12H Eudelia Bunch, RPH   40 mg at 12/16/19 2006  . polyethylene glycol (MIRALAX / GLYCOLAX) packet 17 g  17 g Oral Daily PRN Rai, Ripudeep K, MD      . Sofosbuvir-Velpatasvir 400-100 MG TABS 1 tablet  1 tablet Oral Q1200 BellRonaldo Miyamoto, RPH      . thiamine  tablet 100 mg  100 mg Oral Daily Rai, Ripudeep K, MD   100 mg at 12/16/19 0950   Or  . thiamine (B-1) injection 100 mg  100 mg Intravenous Daily Rai, Vernelle Emerald, MD  Allergies  Allergen Reactions  . Other     Platelets: Rx chest pain, tremors, body aches  :  Family History  Problem Relation Age of Onset  . Lung cancer Mother 71  . Alcohol abuse Mother   . Throat cancer Father 64  :  Social History   Socioeconomic History  . Marital status: Divorced    Spouse name: Not on file  . Number of children: 2  . Years of education: Not on file  . Highest education level: Not on file  Occupational History  . Occupation: applying for disability  Tobacco Use  . Smoking status: Never Smoker  . Smokeless tobacco: Never Used  Substance and Sexual Activity  . Alcohol use: Yes    Alcohol/week: 63.0 standard drinks    Types: 63 Cans of beer per week    Comment: weekly "I have cut back"; 4 days 4 vs 12 pack   . Drug use: Yes    Types: Marijuana    Comment: cocaiane not currently  . Sexual activity: Yes  Other Topics Concern  . Not on file  Social History Narrative   She moved with a boyfriend to [place] and was followed at [facility].  He died of a massive heart attack in 8/18, per her report, and so she moved back to [place] and is living with a friend.      Right handed   2 story home    Lives alone 11/09/19   Social Determinants of Health   Financial Resource Strain:   . Difficulty of Paying Living Expenses:   Food Insecurity:   . Worried About Charity fundraiser in the Last Year:   . Arboriculturist in the Last Year:   Transportation Needs:   . Film/video editor (Medical):   [name] Kitchen Lack of Transportation (Non-Medical):   Physical Activity:   . Days of Exercise per Week:   . Minutes of Exercise per Session:   Stress:   . Feeling of Stress :   Social Connections:   . Frequency of Communication with Friends and Family:   . Frequency of Social  Gatherings with Friends and Family:   . Attends Religious Services:   . Active Member of Clubs or Organizations:   . Attends Archivist Meetings:   [name] Kitchen Marital Status:   Intimate Partner Violence:   . Fear of Current or Ex-Partner:   . Emotionally Abused:   [name] Kitchen Physically Abused:   . Sexually Abused:   :  REVIEW OF SYSTEMS: A comprehensive 14 point review of systems was negative except as noted in the HPI.  Exam: Patient Vitals for the past 24 hrs:  BP Temp Temp src Pulse Resp SpO2  12/17/19 0533 (!) 130/95 97.7 F (36.5 C) Oral 71 18 95 %  12/16/19 2157 130/89 98.3 F (36.8 C) Oral 82 18 93 %  12/16/19 1300 122/88 97.9 F (36.6 C) Oral 94 18 95 %    General:  well-nourished in no acute distress.   Eyes:  no scleral icterus.   ENT:  There were no oropharyngeal lesions.  Respiratory: lungs were clear bilaterally without wheezing or crackles.   Cardiovascular:  Regular rate and rhythm, S1/S2, without murmur, rub or gallop.  There was no pedal edema.   GI:  abdomen was soft, flat, nontender, nondistended, without organomegaly.   Musculoskeletal:  no spinal tenderness of palpation of vertebral spine.   Skin: Tanned, several ecchymoses on the bilateral lower extremities  Neuro exam was nonfocal.  Patient was alert and oriented.  Attention was good.   Language was appropriate.  Mood was normal without depression.  Speech was not pressured.  Thought content was not tangential.    LABS:  Lab Results  Component Value Date   WBC 3.8 (L) 12/17/2019   HGB 12.7 12/17/2019   HCT 38.1 12/17/2019   PLT 21 (LL) 12/17/2019   GLUCOSE 143 (H) 12/17/2019   CHOL 213 (H) 12/13/2019   TRIG 85 12/13/2019   HDL 108 12/13/2019   LDLCALC 90 12/13/2019   ALT 24 12/15/2019   AST 54 (H) 12/15/2019   NA 137 12/17/2019   K 4.6 12/17/2019   CL 106 12/17/2019   CREATININE 0.66 12/17/2019   BUN 13 12/17/2019   CO2 23 12/17/2019   INR 1.0 12/15/2019   HGBA1C 5.1 12/15/2019    CT Head Wo  Contrast  Result Date: 12/15/2019 CLINICAL DATA:  Headache and thrombocytopenia. EXAM: CT HEAD WITHOUT CONTRAST TECHNIQUE: Contiguous axial images were obtained from the base of the skull through the vertex without intravenous contrast. COMPARISON:  05/06/2019 FINDINGS: Brain: No evidence of acute infarction, hemorrhage, hydrocephalus, extra-axial collection or mass lesion/mass effect. Prominence of sulci and ventricles compatible with brain atrophy. Unchanged calcification in left cerebellar hemisphere. Vascular: No hyperdense vessel or unexpected calcification. Skull: Normal. Negative for fracture or focal lesion. Sinuses/Orbits: The paranasal sinuses and mastoid air cells are clear. Other: None. IMPRESSION: 1. No acute intracranial abnormalities Electronically Signed   By: Kerby Moors M.D.   On: 12/15/2019 16:29   US Abdomen Complete  Result Date: 12/16/2019 CLINICAL DATA:  Alcoholic cirrhosis, thrombocytopenia, esophageal varices, GERD, kidney stones, substance abuse EXAM: ABDOMEN ULTRASOUND COMPLETE COMPARISON:  04/11/2019 FINDINGS: Gallbladder: 2.3 cm diameter gallstone in gallbladder. Gallbladder wall appears mildly thickened. Minimal pericholecystic fluid. No sonographic Murphy sign. Common bile duct: Diameter: 5 mm, normal Liver: Nodular liver with coarsened echogenicity consistent with cirrhosis. No focal hepatic mass or nodule. Portal vein is patent on color Doppler imaging with normal direction of blood flow towards the liver. IVC: Short segment of intrahepatic IVC normal appearance, remainder obscured by bowel gas Pancreas: Tail incompletely visualized due to bowel gas, visualized portion normal appearance Spleen: Enlarged, 13.5 cm length with calculated volume of 526 mL. No focal lesion. Right Kidney: Length: 11.9 cm. Normal morphology without mass or hydronephrosis. Left Kidney: Length: 11.7 cm. Normal morphology without mass or hydronephrosis. Abdominal aorta: Predominately obscured by bowel  gas Other findings: No free fluid IMPRESSION: Cirrhotic appearing liver without mass. Cholelithiasis. Mild splenomegaly. Electronically Signed   By: Lavonia Dana M.D.   On: 12/16/2019 12:03   DG Foot Complete Right  Result Date: 12/01/2019 Please see detailed radiograph report in office note.   ASSESSMENT AND PLAN:  1.  Acute on chronic thrombocytopenia 2.  Mild leukopenia 3.  Upper GI bleed 4.  Cirrhosis secondary to alcohol abuse and hepatitis C 5.  Alcohol abuse 6.  History of seizures 7.  History of bipolar disorder  -The patient has acute on chronic thrombocytopenia in the setting of ongoing alcohol abuse and cirrhosis.  She is also noted to have mild splenomegaly on her recent abdominal ultrasound.  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  Given her upper GI bleed and gastritis, I would recommend against use of pulse steroids.  We can consider her for IVIG or Nplate.  Further recommendations per Dr.  Jacoby Zanni.  Recommend supportive platelet transfusions to keep her platelet count above 20,000 or if she has active bleeding. -GI following for her upper GI bleed and have started her on PPI and octreotide. -Alcohol abstinence has again been recommended.  Thank you for this referral.  Mikey Bussing, DNP, AGPCNP-BC, AOCNP Mon/Tues/Thurs/Fri 7am-5pm; Off Wednesdays Cell: [phone]  ADDENDUM: I saw and examined Ms. Sealey.  I agree with the above note by Erasmo Downer.  I am not surprised at all by the fact that she is becoming less responsive to platelet transfusions.  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  She said that she still drinks.  But that she "got mad" and drank half a gallon of vodka.  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  In the past, she has responded to Nplate along with steroids and IVIG.  I noted what happened last  night with the IVIG.  That is okay.  I would just give her a dose today.  Given the fact that she will continue to keep drinking, I am sure that she will end up back in the hospital again.  I would not give her platelets unless she is actively bleeding.  If she can be discharged today, that would be fine from my perspective.  Her platelet count typically goes up in a couple days after receiving the Nplate/IVIG/steroids.  Again, her biggest problem will be continued alcohol use.  She just will not go to rehab for alcohol abstinence.  Her main concern is getting to the tanning bed so she can keep her suntan from fading.  Actually, she probably looks a little bit better than when I typically see her in the hospital.   Lattie Haw, MD  Proverbs 17:17

## 2019-12-17 NOTE — Progress Notes (Signed)
[facility]                                                                              Patient Demographics  Briana Sweeney, is a 49 y.o. female, DOB - 04-11-1971, OX:[id number]  Admit date - 12/15/2019   Admitting Physician Kendalynn Wideman Krystal Eaton, MD  Outpatient Primary MD for the patient is [name], FNP  Outpatient specialists:   LOS - 1  days   Medical records reviewed and are as summarized below:    Chief Complaint  Patient presents with  . Abnormal Lab  . 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 Patient was sent to ED by PCP on noticing low platelets of 22K  ED work-up/course:  In ED, AST 57 ALT 29, CBC WBCs 3.0, hemoglobin 12.6, hematocrit 39.3, platelets 23,000, platelets were 22K on 4/5 In ED, had allergic reaction to platelet transfusion  Assessment & Plan    Principal Problem:   Acute on chronic thrombocytopenia (HCC) with pancytopenia secondary to alcoholic cirrhosis of the liver -Presented with symptomatic thrombocytopenia with upper GI bleed with black tarry stools for 1 week, gum bleeding -Platelet transfusion was started in ED on 4/7 however patient developed sudden shaking with chills, chest pain and transfusion was stopped. Discussed with Dr. Julien Nordmann, on-call, recommended IV Solu-Medrol, Benadryl, Tylenol prior to the transfusion and to notify the blood bank. -Platelet counts 21, has received over 2 units platelet transfusions.   -Patient has been seen by Dr. Marin Olp in the past.  Discussed with Dr. Marin Olp today, will review and evaluate patient, may consider Nplate or IVIG   Active Problems: Upper GI bleed, with known history of cirrhosis of the liver secondary to alcohol and  hepatitis C, thrombocytopenia, pancytopenia -Patient reports black tarry stools for last 1 week, no hematemesis.  She also has been taking ibuprofen for her foot pain -Supportive care, absolute alcohol abstinence -Abdominal ultrasound showed cholelithiasis, mild splenomegaly, cirrhotic appearing liver without mass -Patient reports no bleeding, GI following, DC octreotide, continue PPI and supportive care  Acute cystitis -Patient complaining of dysuria, urinary hesitancy, for last 1 week -UA and culture negative, will DC IV Rocephin -Continue Urispas, no further complaints    Alcohol abuse, polysubstance abuse -Patient still drinks 6 packs of beer every day -Patient recommended on alcohol abstinence, placed on CIWA protocol with Ativan    Alcoholic cirrhosis of liver with ascites ([place]) -Continue lactulose, MiraLAX as needed  Chronic hepatitis C  -Patient follows [facility], continue Epclusa  History of seizures Continue Keppra at outpatient dose  History of bipolar disorder Continue all psych medications   Code Status: Full code DVT Prophylaxis: SCDs Family Communication: Discussed all imaging results, lab results, explained to the patient    Disposition Plan: Will await hematology recommendations regarding thrombocytopenia.  Platelet count still on lower side, 21K, to discharge home safely  Time Spent in minutes   35 minutes  Procedures:  None  Consultants:   Hematology Gastroenterology  Antimicrobials:   Anti-infectives (From admission, onward)   Start     Dose/Rate Route Frequency Ordered Stop   12/16/19 1200  Sofosbuvir-Velpatasvir 400-100 MG TABS 1 tablet    Note to Pharmacy: Take 1 tablet by mouth daily.     1 tablet Oral Daily 12/15/19 1920     12/15/19 1915  Sofosbuvir-Velpatasvir 400-100 MG TABS 1 tablet  Status:  Discontinued    Note to Pharmacy: Take 1 tablet by mouth daily.     1 tablet Oral Daily 12/15/19 1905 12/15/19 1920   12/15/19 1815   cefTRIAXone (ROCEPHIN) 1 g in sodium chloride 0.9 % 100 mL IVPB  Status:  Discontinued     1 g 200 mL/hr over 30 Minutes Intravenous Every 24 hours 12/15/19 1800 12/16/19 1256         Medications  Scheduled Meds: . ARIPiprazole  5 mg Oral Daily  . flavoxATE  100 mg Oral TID  . FLUoxetine  40 mg Oral Daily  . folic acid  1 mg Oral Daily  . lactulose  20 g Oral BID  . levETIRAcetam  500 mg Oral BID  . mirtazapine  30 mg Oral QHS  . multivitamin with minerals  1 tablet Oral Daily  . pantoprazole (PROTONIX) IV  40 mg Intravenous Q12H  . Sofosbuvir-Velpatasvir  1 tablet Oral Q1200  . thiamine  100 mg Oral Daily   Or  . thiamine  100 mg Intravenous Daily   Continuous Infusions: . dexamethasone (DECADRON) IVPB ([facility]) 24 mg (12/17/19 1428)  . IMMUNE GLOBULIN 10% (HUMAN) IV - For Fluid Restriction Only     PRN Meds:.acetaminophen **OR** acetaminophen, clonazePAM, hydrOXYzine, LORazepam **OR** LORazepam, ondansetron **OR** ondansetron (ZOFRAN) IV, polyethylene glycol      Subjective:   Briana Sweeney was seen and examined today.  States no further gum bleeding or GI bleeding overnight, but afraid to go home with platelets so low.  States she has not been able to tolerate platelet transfusions outpatient due to allergic reactions requiring premedication.  Patient denies dizziness, chest pain, shortness of breath, abdominal pain, N/V/D/C, new weakness, numbess, tingling. No acute events overnight.    Objective:   Vitals:   12/17/19 0533 12/17/19 1132 12/17/19 1359 12/17/19 1413  BP: (!) 130/95 120/88 (!) 138/98 (!) 132/95  Pulse: 71 64 76 71  Resp: 18 20 20 20   Temp: 97.7 F (36.5 C) 98.2 F (36.8 C) 98.1 F (36.7 C) 97.9 F (36.6 C)  TempSrc: Oral Oral Oral Oral  SpO2: 95% 94% 95% 94%  Weight:      Height:        Intake/Output Summary (Last 24 hours) at 12/17/2019 1610 Last data filed at 12/17/2019 1426 Gross per 24 hour  Intake 3003 ml  Output --  Net 3003 ml     Wt  Readings from Last 3 Encounters:  12/15/19 65.9 kg  12/13/19 65.3 kg  11/09/19 66.7 kg    Physical Exam  General: Alert and oriented x 3, NAD  Cardiovascular: S1 S2 clear, RRR. No pedal edema b/l  Respiratory: CTAB, no wheezing, rales or rhonchi  Gastrointestinal: Soft, nontender, nondistended, NBS  Ext: no pedal edema bilaterally  Neuro: no new deficits  Musculoskeletal: No cyanosis, clubbing  Skin: No rashes  Psych: Normal affect and demeanor, alert and oriented x3       Data Reviewed:  I have personally reviewed  following labs and imaging studies  Micro Results Recent Results (from the past 240 hour(s))  Urine Culture     Status: None   Collection Time: 12/13/19  3:55 PM   Specimen: Urine   UR  Result Value Ref Range Status   Urine Culture, Routine Final report  Final   Organism ID, Bacteria Comment  Final    Comment: Mixed urogenital flora 25,000-50,000 colony forming units per mL   Culture, blood (Routine X 2) w Reflex to ID Panel     Status: None (Preliminary result)   Collection Time: 12/15/19  6:28 PM   Specimen: BLOOD RIGHT HAND  Result Value Ref Range Status   Specimen Description   Final    BLOOD RIGHT HAND Performed at [facility], [place] [address], [place], [place] [address]    Special Requests   Final    BOTTLES DRAWN AEROBIC AND ANAEROBIC Blood Culture adequate volume Performed at [facility] [address], [place], [place] [address]    Culture   Final    NO GROWTH 2 DAYS Performed at [facility] [address], [place], [place] [address]    Report Status PENDING  Incomplete  SARS CORONAVIRUS 2 (TAT 6-24 HRS) Nasopharyngeal Urine, Clean Catch     Status: None   Collection Time: 12/15/19  6:41 PM   Specimen: Urine, Clean Catch; Nasopharyngeal  Result Value Ref Range Status   SARS Coronavirus 2 NEGATIVE NEGATIVE Final    Comment: (NOTE) SARS-CoV-2 target nucleic acids are NOT DETECTED. The  SARS-CoV-2 RNA is generally detectable in upper and lower respiratory specimens during the acute phase of infection. Negative results do not preclude SARS-CoV-2 infection, do not rule out co-infections with other pathogens, and should not be used as the sole basis for treatment or other patient management decisions. Negative results must be combined with clinical observations, patient history, and epidemiological information. The expected result is Negative. Fact Sheet for Patients: SugarRoll.be Fact Sheet for Healthcare Providers: https://www.woods-mathews.com/ This test is not yet approved or cleared by the [place] FDA and  has been authorized for detection and/or diagnosis of SARS-CoV-2 by FDA under an Emergency Use Authorization (EUA). This EUA will remain  in effect (meaning this test can be used) for the duration of the COVID-19 declaration under Section 56 4(b)(1) of the Act, 21 U.S.C. section 360bbb-3(b)(1), unless the authorization is terminated or revoked sooner. Performed at [facility], [place] [address], [place], [place] [address]   Urine Culture     Status: Abnormal (Preliminary result)   Collection Time: 12/15/19  6:41 PM   Specimen: Urine, Clean Catch  Result Value Ref Range Status   Specimen Description   Final    URINE, CLEAN CATCH Performed at [facility], [place] [address], [place], [place] [address]    Special Requests   Final    NONE Performed at [facility], [place] [address] [place], [place] [address]    Culture 10,000 COLONIES/mL GRAM NEGATIVE RODS (A)  Final   Report Status PENDING  Incomplete  Culture, blood (Routine X 2) w Reflex to ID Panel     Status: None (Preliminary result)   Collection Time: 12/15/19  8:24 PM   Specimen: BLOOD RIGHT HAND  Result Value Ref Range Status   Specimen Description   Final    BLOOD RIGHT HAND Performed at [facility] [address], [place], [place] [address]  Special Requests   Final    BOTTLES DRAWN AEROBIC AND ANAEROBIC Blood Culture adequate volume Performed at [facility] [address] [place], [place] [address]    Culture   Final    NO GROWTH 2 DAYS Performed at [facility] [address], [place], [place] [address]    Report Status PENDING  Incomplete    Radiology Reports CT Head Wo Contrast  Result Date: 12/15/2019 CLINICAL DATA:  Headache and thrombocytopenia. EXAM: CT HEAD WITHOUT CONTRAST TECHNIQUE: Contiguous axial images were obtained from the base of the skull through the vertex without intravenous contrast. COMPARISON:  05/06/2019 FINDINGS: Brain: No evidence of acute infarction, hemorrhage, hydrocephalus, extra-axial collection or mass lesion/mass effect. Prominence of sulci and ventricles compatible with brain atrophy. Unchanged calcification in left cerebellar hemisphere. Vascular: No hyperdense vessel or unexpected calcification. Skull: Normal. Negative for fracture or focal lesion. Sinuses/Orbits: The paranasal sinuses and mastoid air cells are clear. Other: None. IMPRESSION: 1. No acute intracranial abnormalities Electronically Signed   By: Kerby Moors M.D.   On: 12/15/2019 16:29   US Abdomen Complete  Result Date: 12/16/2019 CLINICAL DATA:  Alcoholic cirrhosis, thrombocytopenia, esophageal varices, GERD, kidney stones, substance abuse EXAM: ABDOMEN ULTRASOUND COMPLETE COMPARISON:  04/11/2019 FINDINGS: Gallbladder: 2.3 cm diameter gallstone in gallbladder. Gallbladder wall appears mildly thickened. Minimal pericholecystic fluid. No sonographic Murphy sign. Common bile duct: Diameter: 5 mm, normal Liver: Nodular liver with coarsened echogenicity consistent with cirrhosis. No focal hepatic mass or nodule. Portal vein is patent on color Doppler imaging with normal direction of blood flow towards the liver. IVC: Short segment of intrahepatic IVC  normal appearance, remainder obscured by bowel gas Pancreas: Tail incompletely visualized due to bowel gas, visualized portion normal appearance Spleen: Enlarged, 13.5 cm length with calculated volume of 526 mL. No focal lesion. Right Kidney: Length: 11.9 cm. Normal morphology without mass or hydronephrosis. Left Kidney: Length: 11.7 cm. Normal morphology without mass or hydronephrosis. Abdominal aorta: Predominately obscured by bowel gas Other findings: No free fluid IMPRESSION: Cirrhotic appearing liver without mass. Cholelithiasis. Mild splenomegaly. Electronically Signed   By: Lavonia Dana M.D.   On: 12/16/2019 12:03   DG Foot Complete Right  Result Date: 12/01/2019 Please see detailed radiograph report in office note.   Lab Data:  CBC: Recent Labs  Lab 12/13/19 1603 12/15/19 1332 12/15/19 1828 12/16/19 0443 12/17/19 0428  WBC 3.4 3.0* 1.4* 6.6 3.8*  NEUTROABS 2.3  --   --   --   --   HGB 13.9 12.6 12.4 13.0 12.7  HCT 40.0 39.3 38.3 39.1 38.1  MCV 89 93.6 94.1 94.2 92.7  PLT 22* 23* 19* 17* 21*   Basic Metabolic Panel: Recent Labs  Lab 12/13/19 1603 12/15/19 1332 12/15/19 1828 12/16/19 0443 12/17/19 0428  NA 140 140 139 136 137  K 4.1 3.8 3.5 3.9 4.6  CL 106 106 106 101 106  CO2 20 23 21* 21* 23  GLUCOSE 98 93 72 203* 143*  BUN 6 8 5* 7 13  CREATININE 0.66 0.51 0.41* 0.53 0.66  CALCIUM 8.9 8.8* 8.3* 8.5* 8.7*  MG  --   --  1.4* 1.6*  --   PHOS  --   --  2.3* 3.6  --    GFR: Estimated Creatinine Clearance: 73.7 mL/min (by C-G formula based on SCr of 0.66 mg/dL). Liver Function Tests: Recent Labs  Lab 12/13/19 1603 12/15/19 1332 12/15/19 1828  AST 56* 57* 54*  ALT 23 29  24  ALKPHOS 241* 180* 163*  BILITOT 1.3* 1.0 1.4*  PROT 7.5 7.4 6.8  ALBUMIN 4.0 3.6 3.2*   No results for input(s): LIPASE, AMYLASE in the last 168 hours. No results for input(s): AMMONIA in the last 168 hours. Coagulation Profile: Recent Labs  Lab 12/15/19 1332  INR 1.0   Cardiac  Enzymes: No results for input(s): CKTOTAL, CKMB, CKMBINDEX, TROPONINI in the last 168 hours. BNP (last 3 results) No results for input(s): PROBNP in the last 8760 hours. HbA1C: Recent Labs    12/15/19 1332  HGBA1C 5.1   CBG: No results for input(s): GLUCAP in the last 168 hours. Lipid Profile: No results for input(s): CHOL, HDL, LDLCALC, TRIG, CHOLHDL, LDLDIRECT in the last 72 hours. Thyroid Function Tests: Recent Labs    12/16/19 0443  TSH 0.304*   Anemia Panel: No results for input(s): VITAMINB12, FOLATE, FERRITIN, TIBC, IRON, RETICCTPCT in the last 72 hours. Urine analysis:    Component Value Date/Time   COLORURINE STRAW (A) 12/15/2019 1841   APPEARANCEUR CLEAR 12/15/2019 1841   LABSPEC 1.006 12/15/2019 1841   PHURINE 5.0 12/15/2019 1841   GLUCOSEU NEGATIVE 12/15/2019 1841   HGBUR NEGATIVE 12/15/2019 1841   BILIRUBINUR NEGATIVE 12/15/2019 1841   BILIRUBINUR Small 12/13/2019 1542   KETONESUR NEGATIVE 12/15/2019 1841   PROTEINUR NEGATIVE 12/15/2019 1841   UROBILINOGEN 0.2 12/13/2019 1542   UROBILINOGEN 0.2 12/02/2017 0836   NITRITE NEGATIVE 12/15/2019 1841   LEUKOCYTESUR NEGATIVE 12/15/2019 1841     Amberlynn Tempesta M.D. [facility] 12/17/2019, 4:10 PM   Call night coverage person covering after 7pm

## 2019-12-18 DIAGNOSIS — D696 Thrombocytopenia, unspecified: Secondary | ICD-10-CM

## 2019-12-18 LAB — PREPARE PLATELET PHERESIS
Unit division: 0
Unit division: 0

## 2019-12-18 LAB — BASIC METABOLIC PANEL
Anion gap: 9 (ref 5–15)
BUN: 10 mg/dL (ref 6–20)
CO2: 22 mmol/L (ref 22–32)
Calcium: 8.9 mg/dL (ref 8.9–10.3)
Chloride: 108 mmol/L (ref 98–111)
Creatinine, Ser: 0.5 mg/dL (ref 0.44–1.00)
GFR calc Af Amer: >60 mL/min (ref >60)
GFR calc non Af Amer: >60 mL/min (ref >60)
Glucose, Bld: 194 mg/dL — ABNORMAL HIGH (ref 70–99)
Potassium: 3.7 mmol/L (ref 3.5–5.1)
Sodium: 139 mmol/L (ref 135–145)

## 2019-12-18 LAB — BPAM PLATELET PHERESIS
Blood Product Expiration Date: 202104112359
Blood Product Expiration Date: 202104112359
ISSUE DATE / TIME: 202104081032
ISSUE DATE / TIME: 202104091056
Unit Type and Rh: 2800
Unit Type and Rh: 5100

## 2019-12-18 LAB — CBC
HCT: 40.7 % (ref 36.0–46.0)
Hemoglobin: 13.1 g/dL (ref 12.0–15.0)
MCH: 30.5 pg (ref 26.0–34.0)
MCHC: 32.2 g/dL (ref 30.0–36.0)
MCV: 94.7 fL (ref 80.0–100.0)
Platelets: 24 10*3/uL — CL (ref 150–400)
RBC: 4.3 MIL/uL (ref 3.87–5.11)
RDW: 15.8 % — ABNORMAL HIGH (ref 11.5–15.5)
WBC: 4.6 10*3/uL (ref 4.0–10.5)
nRBC: 0 % (ref 0.0–0.2)

## 2019-12-18 LAB — GLUCOSE, CAPILLARY: Glucose-Capillary: 177 mg/dL — ABNORMAL HIGH (ref 70–99)

## 2019-12-18 LAB — URINE CULTURE: Culture: 10000 — AB

## 2019-12-18 MED ORDER — LORAZEPAM 1 MG PO TABS
1.0000 mg | ORAL_TABLET | ORAL | Status: DC | PRN
  Administered 2019-12-19: 1 mg via ORAL
  Filled 2019-12-18: qty 1

## 2019-12-18 MED ORDER — IMMUNE GLOBULIN (HUMAN) 10 GM/100ML IV SOLN
70.0000 g | INTRAVENOUS | Status: AC
  Administered 2019-12-19: 70 g via INTRAVENOUS
  Filled 2019-12-18: qty 600

## 2019-12-18 MED ORDER — LORAZEPAM 2 MG/ML IJ SOLN
1.0000 mg | INTRAMUSCULAR | Status: DC | PRN
  Administered 2019-12-18: 1 mg via INTRAVENOUS
  Filled 2019-12-18: qty 1

## 2019-12-18 MED ORDER — DIPHENHYDRAMINE HCL 50 MG/ML IJ SOLN
25.0000 mg | Freq: Once | INTRAMUSCULAR | Status: AC
  Administered 2019-12-18: 25 mg via INTRAVENOUS
  Filled 2019-12-18: qty 1

## 2019-12-18 NOTE — Progress Notes (Addendum)
IVIG started on day shift at 1816 by nursing staff. Per shift report the IVIG was already at max rate and no longer needed to be titrated. The writer observed the IV pump running D5NS as the primary infusion at 169 ml/hr. The IVIG was hung as a secondary, but did not infuse. The IV team and pharmacy was notified. IV team consulted with pharmacy and was instructed by pharmacy to discard the medication. Vitals are stable and patient in no acute distress. Hospitalist notified and made aware of situation. Dr. Alen Blew notified and verbal order given for patient to have a total of two doses of IVIG, second dose scheduled for 4/11 at 1400. Will continue to monitor the patient closely.

## 2019-12-18 NOTE — Progress Notes (Signed)
[facility]                                                                              Patient Demographics  Briana Sweeney, is a 49 y.o. female, DOB - 1971/07/14, OX:[id number]  Admit date - 12/15/2019   Admitting Physician Alexah Kivett Krystal Eaton, MD  Outpatient Primary MD for the patient is [name], FNP  Outpatient specialists:   LOS - 2  days   Medical records reviewed and are as summarized below:    Chief Complaint  Patient presents with  . Abnormal Lab  . 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 Patient was sent to ED by PCP on noticing low platelets of 22K  ED work-up/course:  In ED, AST 57 ALT 29, CBC WBCs 3.0, hemoglobin 12.6, hematocrit 39.3, platelets 23,000, platelets were 22K on 4/5 In ED, had allergic reaction to platelet transfusion  Assessment & Plan    Principal Problem:   Acute on chronic thrombocytopenia (HCC) with pancytopenia secondary to alcoholic cirrhosis of the liver -Presented with symptomatic thrombocytopenia with upper GI bleed with black tarry stools for 1 week, gum bleeding -Received platelet transfusion on 4/7, 4/8 with no significant improvement.  Hematology consulted, Dr. Marin Olp recommended Nplate and IVIG with steroids -Did not received IVIG yesterday due to tubing issue, receiving first dose today, status post Nplate on 4/9.  Platelets 24K   Active Problems: Upper GI bleed, with known history of cirrhosis of the liver secondary to alcohol and hepatitis C, thrombocytopenia, pancytopenia -Patient reports black tarry stools for last 1 week, no hematemesis.  She also has been taking ibuprofen for her foot pain -Supportive care, absolute alcohol  abstinence -Abdominal ultrasound showed cholelithiasis, mild splenomegaly, cirrhotic appearing liver without mass.  Octreotide drip discontinued, continue PPI and supportive care. GI signed off  Acute cystitis -Patient complaining of dysuria, urinary hesitancy, for last 1 week -UA and culture negative, will DC IV Rocephin -Continue Urispas, no further complaints    Alcohol abuse, polysubstance abuse -Patient still drinks 6 packs of beer every day -Counseled again on alcohol cessation, continue CIWA protocol with Ativan    Alcoholic cirrhosis of liver with ascites ([place]) -Continue lactulose, MiraLAX as needed  Chronic hepatitis C  -Patient follows [facility], continue Epclusa  History of seizures Continue Keppra at outpatient dose  History of bipolar disorder Continue all psych medications   Code Status: Full code DVT Prophylaxis: SCDs Family Communication: Discussed all imaging results, lab results, explained to the patient    Disposition Plan: Platelet count starting to improve, appreciate hematology commendations, likely DC home in a.m. after second dose of IVIG   Time Spent in minutes   35 minutes  Procedures:  None  Consultants:   Hematology Gastroenterology  Antimicrobials:   Anti-infectives (From admission, onward)   Start     Dose/Rate Route Frequency Ordered Stop  12/16/19 1200  Sofosbuvir-Velpatasvir 400-100 MG TABS 1 tablet    Note to Pharmacy: Take 1 tablet by mouth daily.     1 tablet Oral Daily 12/15/19 1920     12/15/19 1915  Sofosbuvir-Velpatasvir 400-100 MG TABS 1 tablet  Status:  Discontinued    Note to Pharmacy: Take 1 tablet by mouth daily.     1 tablet Oral Daily 12/15/19 1905 12/15/19 1920   12/15/19 1815  cefTRIAXone (ROCEPHIN) 1 g in sodium chloride 0.9 % 100 mL IVPB  Status:  Discontinued     1 g 200 mL/hr over 30 Minutes Intravenous Every 24 hours 12/15/19 1800 12/16/19 1256         Medications  Scheduled Meds: .  ARIPiprazole  5 mg Oral Daily  . flavoxATE  100 mg Oral TID  . FLUoxetine  40 mg Oral Daily  . folic acid  1 mg Oral Daily  . lactulose  20 g Oral BID  . levETIRAcetam  500 mg Oral BID  . mirtazapine  30 mg Oral QHS  . multivitamin with minerals  1 tablet Oral Daily  . pantoprazole (PROTONIX) IV  40 mg Intravenous Q12H  . Sofosbuvir-Velpatasvir  1 tablet Oral Q1200  . thiamine  100 mg Oral Daily   Or  . thiamine  100 mg Intravenous Daily   Continuous Infusions: . IMMUNE GLOBULIN 10% (HUMAN) IV - For Fluid Restriction Only 0 g (12/18/19 0439)  . [START ON 12/19/2019] IMMUNE GLOBULIN 10% (HUMAN) IV - For Fluid Restriction Only     PRN Meds:.acetaminophen **OR** acetaminophen, clonazePAM, hydrOXYzine, LORazepam **OR** LORazepam, ondansetron **OR** ondansetron (ZOFRAN) IV, polyethylene glycol      Subjective:   Briana Sweeney was seen and examined today.  Palpation, no further GI bleed or gum bleeding.  Patient denies dizziness, chest pain, shortness of breath, abdominal pain, N/V/D/C, new weakness, numbess, tingling. No acute events overnight.    Objective:   Vitals:   12/18/19 1141 12/18/19 1154 12/18/19 1323 12/18/19 1421  BP: 131/82 122/85 126/88 133/87  Pulse: (!) 59 (!) 55 60 (!) 58  Resp: 16 20 18 16   Temp: (!) 97.4 F (36.3 C) 97.6 F (36.4 C) (!) 97.5 F (36.4 C) 97.7 F (36.5 C)  TempSrc: Oral Oral Oral Oral  SpO2: 97% 97% 93% 95%  Weight:      Height:        Intake/Output Summary (Last 24 hours) at 12/18/2019 1443 Last data filed at 12/18/2019 1023 Gross per 24 hour  Intake 580.81 ml  Output 250 ml  Net 330.81 ml     Wt Readings from Last 3 Encounters:  12/15/19 65.9 kg  12/13/19 65.3 kg  11/09/19 66.7 kg   Physical Exam  General: Alert and oriented x 3, NAD  Cardiovascular: S1 S2 clear, RRR. No pedal edema b/l  Respiratory: CTAB, no wheezing, rales or rhonchi  Gastrointestinal: Soft, nontender, nondistended, NBS  Ext: no pedal edema  bilaterally  Neuro: no new deficits  Musculoskeletal: No cyanosis, clubbing  Skin: No rashes  Psych: Normal affect and demeanor, alert and oriented x3   Data Reviewed:  I have personally reviewed following labs and imaging studies  Micro Results Recent Results (from the past 240 hour(s))  Urine Culture     Status: None   Collection Time: 12/13/19  3:55 PM   Specimen: Urine   UR  Result Value Ref Range Status   Urine Culture, Routine Final report  Final   Organism ID, Bacteria Comment  Final    Comment: Mixed urogenital flora 25,000-50,000 colony forming units per mL   Culture, blood (Routine X 2) w Reflex to ID Panel     Status: None (Preliminary result)   Collection Time: 12/15/19  6:28 PM   Specimen: BLOOD RIGHT HAND  Result Value Ref Range Status   Specimen Description   Final    BLOOD RIGHT HAND Performed at [facility] [address], [place], [place] [address]    Special Requests   Final    BOTTLES DRAWN AEROBIC AND ANAEROBIC Blood Culture adequate volume Performed at [facility] [address] [place], [place] [address]    Culture   Final    NO GROWTH 3 DAYS Performed at [facility], [place] [address] [place], [place] [address]    Report Status PENDING  Incomplete  SARS CORONAVIRUS 2 (TAT 6-24 HRS) Nasopharyngeal Urine, Clean Catch     Status: None   Collection Time: 12/15/19  6:41 PM   Specimen: Urine, Clean Catch; Nasopharyngeal  Result Value Ref Range Status   SARS Coronavirus 2 NEGATIVE NEGATIVE Final    Comment: (NOTE) SARS-CoV-2 target nucleic acids are NOT DETECTED. The SARS-CoV-2 RNA is generally detectable in upper and lower respiratory specimens during the acute phase of infection. Negative results do not preclude SARS-CoV-2 infection, do not rule out co-infections with other pathogens, and should not be used as the sole basis for treatment or other patient management decisions. Negative results must  be combined with clinical observations, patient history, and epidemiological information. The expected result is Negative. Fact Sheet for Patients: SugarRoll.be Fact Sheet for Healthcare Providers: https://www.woods-mathews.com/ This test is not yet approved or cleared by the [place] FDA and  has been authorized for detection and/or diagnosis of SARS-CoV-2 by FDA under an Emergency Use Authorization (EUA). This EUA will remain  in effect (meaning this test can be used) for the duration of the COVID-19 declaration under Section 56 4(b)(1) of the Act, 21 U.S.C. section 360bbb-3(b)(1), unless the authorization is terminated or revoked sooner. Performed at [facility], [place] [address], [place], [place] [address]   Urine Culture     Status: Abnormal   Collection Time: 12/15/19  6:41 PM   Specimen: Urine, Clean Catch  Result Value Ref Range Status   Specimen Description   Final    URINE, CLEAN CATCH Performed at [facility], [place] [address] [place], [place] [address]    Special Requests   Final    NONE Performed at [facility], [place] [address] [place], [place] [address]    Culture 10,000 COLONIES/mL ESCHERICHIA COLI (A)  Final   Report Status 12/18/2019 FINAL  Final   Organism ID, Bacteria ESCHERICHIA COLI (A)  Final      Susceptibility   Escherichia coli - MIC*    AMPICILLIN >=32 RESISTANT Resistant     CEFAZOLIN <=4 SENSITIVE Sensitive     CEFTRIAXONE <=0.25 SENSITIVE Sensitive     CIPROFLOXACIN <=0.25 SENSITIVE Sensitive     GENTAMICIN <=1 SENSITIVE Sensitive     IMIPENEM <=0.25 SENSITIVE Sensitive     NITROFURANTOIN <=16 SENSITIVE Sensitive     TRIMETH/SULFA >=320 RESISTANT Resistant     AMPICILLIN/SULBACTAM >=32 RESISTANT Resistant     PIP/TAZO <=4 SENSITIVE Sensitive     * 10,000 COLONIES/mL ESCHERICHIA COLI  Culture, blood (Routine X 2) w Reflex to ID Panel     Status: None  (Preliminary result)  Collection Time: 12/15/19  8:24 PM   Specimen: BLOOD RIGHT HAND  Result Value Ref Range Status   Specimen Description   Final    BLOOD RIGHT HAND Performed at [facility] [address] [place], [place] [address]    Special Requests   Final    BOTTLES DRAWN AEROBIC AND ANAEROBIC Blood Culture adequate volume Performed at [facility] [address], [place], [place] [address]    Culture   Final    NO GROWTH 3 DAYS Performed at [facility], [place] [address], [place], [place] [address]    Report Status PENDING  Incomplete    Radiology Reports CT Head Wo Contrast  Result Date: 12/15/2019 CLINICAL DATA:  Headache and thrombocytopenia. EXAM: CT HEAD WITHOUT CONTRAST TECHNIQUE: Contiguous axial images were obtained from the base of the skull through the vertex without intravenous contrast. COMPARISON:  05/06/2019 FINDINGS: Brain: No evidence of acute infarction, hemorrhage, hydrocephalus, extra-axial collection or mass lesion/mass effect. Prominence of sulci and ventricles compatible with brain atrophy. Unchanged calcification in left cerebellar hemisphere. Vascular: No hyperdense vessel or unexpected calcification. Skull: Normal. Negative for fracture or focal lesion. Sinuses/Orbits: The paranasal sinuses and mastoid air cells are clear. Other: None. IMPRESSION: 1. No acute intracranial abnormalities Electronically Signed   By: Kerby Moors M.D.   On: 12/15/2019 16:29   US Abdomen Complete  Result Date: 12/16/2019 CLINICAL DATA:  Alcoholic cirrhosis, thrombocytopenia, esophageal varices, GERD, kidney stones, substance abuse EXAM: ABDOMEN ULTRASOUND COMPLETE COMPARISON:  04/11/2019 FINDINGS: Gallbladder: 2.3 cm diameter gallstone in gallbladder. Gallbladder wall appears mildly thickened. Minimal pericholecystic fluid. No sonographic Murphy sign. Common bile duct: Diameter: 5 mm, normal Liver: Nodular liver with coarsened  echogenicity consistent with cirrhosis. No focal hepatic mass or nodule. Portal vein is patent on color Doppler imaging with normal direction of blood flow towards the liver. IVC: Short segment of intrahepatic IVC normal appearance, remainder obscured by bowel gas Pancreas: Tail incompletely visualized due to bowel gas, visualized portion normal appearance Spleen: Enlarged, 13.5 cm length with calculated volume of 526 mL. No focal lesion. Right Kidney: Length: 11.9 cm. Normal morphology without mass or hydronephrosis. Left Kidney: Length: 11.7 cm. Normal morphology without mass or hydronephrosis. Abdominal aorta: Predominately obscured by bowel gas Other findings: No free fluid IMPRESSION: Cirrhotic appearing liver without mass. Cholelithiasis. Mild splenomegaly. Electronically Signed   By: Lavonia Dana M.D.   On: 12/16/2019 12:03   DG Foot Complete Right  Result Date: 12/01/2019 Please see detailed radiograph report in office note.   Lab Data:  CBC: Recent Labs  Lab 12/13/19 1603 12/15/19 1332 12/15/19 1828 12/16/19 0443 12/17/19 0428 12/18/19 0404  WBC 3.4 3.0* 1.4* 6.6 3.8* 4.6  NEUTROABS 2.3  --   --   --   --   --   HGB 13.9 12.6 12.4 13.0 12.7 13.1  HCT 40.0 39.3 38.3 39.1 38.1 40.7  MCV 89 93.6 94.1 94.2 92.7 94.7  PLT 22* 23* 19* 17* 21* 24*   Basic Metabolic Panel: Recent Labs  Lab 12/15/19 1332 12/15/19 1828 12/16/19 0443 12/17/19 0428 12/18/19 0404  NA 140 139 136 137 139  K 3.8 3.5 3.9 4.6 3.7  CL 106 106 101 106 108  CO2 23 21* 21* 23 22  GLUCOSE 93 72 203* 143* 194*  BUN 8 5* 7 13 10   CREATININE 0.51 0.41* 0.53 0.66 0.50  CALCIUM 8.8* 8.3* 8.5* 8.7* 8.9  MG  --  1.4* 1.6*  --   --  PHOS  --  2.3* 3.6  --   --    GFR: Estimated Creatinine Clearance: 73.7 mL/min (by C-G formula based on SCr of 0.5 mg/dL). Liver Function Tests: Recent Labs  Lab 12/13/19 1603 12/15/19 1332 12/15/19 1828  AST 56* 57* 54*  ALT 23 29 24   ALKPHOS 241* 180* 163*  BILITOT  1.3* 1.0 1.4*  PROT 7.5 7.4 6.8  ALBUMIN 4.0 3.6 3.2*   No results for input(s): LIPASE, AMYLASE in the last 168 hours. No results for input(s): AMMONIA in the last 168 hours. Coagulation Profile: Recent Labs  Lab 12/15/19 1332  INR 1.0   Cardiac Enzymes: No results for input(s): CKTOTAL, CKMB, CKMBINDEX, TROPONINI in the last 168 hours. BNP (last 3 results) No results for input(s): PROBNP in the last 8760 hours. HbA1C: No results for input(s): HGBA1C in the last 72 hours. CBG: Recent Labs  Lab 12/18/19 0335  GLUCAP 177*   Lipid Profile: No results for input(s): CHOL, HDL, LDLCALC, TRIG, CHOLHDL, LDLDIRECT in the last 72 hours. Thyroid Function Tests: Recent Labs    12/16/19 0443  TSH 0.304*   Anemia Panel: No results for input(s): VITAMINB12, FOLATE, FERRITIN, TIBC, IRON, RETICCTPCT in the last 72 hours. Urine analysis:    Component Value Date/Time   COLORURINE STRAW (A) 12/15/2019 1841   APPEARANCEUR CLEAR 12/15/2019 1841   LABSPEC 1.006 12/15/2019 1841   PHURINE 5.0 12/15/2019 1841   GLUCOSEU NEGATIVE 12/15/2019 1841   HGBUR NEGATIVE 12/15/2019 1841   BILIRUBINUR NEGATIVE 12/15/2019 1841   BILIRUBINUR [place] 12/13/2019 1542   KETONESUR NEGATIVE 12/15/2019 1841   PROTEINUR NEGATIVE 12/15/2019 1841   UROBILINOGEN 0.2 12/13/2019 1542   UROBILINOGEN 0.2 12/02/2017 0836   NITRITE NEGATIVE 12/15/2019 1841   LEUKOCYTESUR NEGATIVE 12/15/2019 1841     Briana Sweeney M.D. [facility] 12/18/2019, 2:43 PM   Call night coverage person covering after 7pm

## 2019-12-18 NOTE — Progress Notes (Signed)
Subjective: Patient denies abdominal discomfort.  She states she caught nauseous after receiving IVIG infusion yesterday. Patient to receive another dose of IVIG tomorrow. Patient states her last bowel movement was regular and brown and has not noted black stools for a few days. She denies vomiting blood.  Objective: Vital signs in last 24 hours: Temp:  [97.6 F (36.4 C)-98.5 F (36.9 C)] 97.6 F (36.4 C) (04/10 0520) Pulse Rate:  [50-76] 50 (04/10 0520) Resp:  [18-20] 18 (04/10 0520) BP: (120-143)/(82-98) 124/82 (04/10 0520) SpO2:  [93 %-95 %] 93 % (04/10 0520) Weight change:  Last BM Date: 12/16/19  PE: Sitting up on bed, appears comfortable GENERAL: No obvious icterus, no pallor ABDOMEN: Soft, nondistended, no evidence of shifting dullness or fluid thrill, nontender, normoactive bowel sounds EXTREMITIES: No deformity, no edema  Lab Results: Results for orders placed or performed during the hospital encounter of 12/15/19 (from the past 48 hour(s))  CBC     Status: Abnormal   Collection Time: 12/17/19  4:28 AM  Result Value Ref Range   WBC 3.8 (L) 4.0 - 10.5 K/uL   RBC 4.11 3.87 - 5.11 MIL/uL   Hemoglobin 12.7 12.0 - 15.0 g/dL   HCT 38.1 36.0 - 46.0 %   MCV 92.7 80.0 - 100.0 fL   MCH 30.9 26.0 - 34.0 pg   MCHC 33.3 30.0 - 36.0 g/dL   RDW 15.7 (H) 11.5 - 15.5 %   Platelets 21 (LL) 150 - 400 K/uL    Comment: SPECIMEN CHECKED FOR CLOTS CRITICAL VALUE NOTED.  VALUE IS CONSISTENT WITH PREVIOUSLY REPORTED AND CALLED VALUE. Immature Platelet Fraction may be clinically indicated, consider ordering this additional test [id number] REPEATED TO VERIFY    nRBC 0.0 0.0 - 0.2 %    Comment: Performed at [facility], [place] [address] [place], [place] [id number]  Basic metabolic panel     Status: Abnormal   Collection Time: 12/17/19  4:28 AM  Result Value Ref Range   Sodium 137 135 - 145 mmol/L   Potassium 4.6 3.5 - 5.1 mmol/L   Chloride 106 98 - 111 mmol/L   CO2  23 22 - 32 mmol/L   Glucose, Bld 143 (H) 70 - 99 mg/dL    Comment: Glucose reference range applies only to samples taken after fasting for at least 8 hours.   BUN 13 6 - 20 mg/dL   Creatinine, Ser 0.66 0.44 - 1.00 mg/dL   Calcium 8.7 (L) 8.9 - 10.3 mg/dL   GFR calc non Af Amer >60 >60 mL/min   GFR calc Af Amer >60 >60 mL/min   Anion gap 8 5 - 15    Comment: Performed at [facility], [place] [address] [place], [place] [address]  Glucose, capillary     Status: Abnormal   Collection Time: 12/18/19  3:35 AM  Result Value Ref Range   Glucose-Capillary 177 (H) 70 - 99 mg/dL    Comment: Glucose reference range applies only to samples taken after fasting for at least 8 hours.  CBC     Status: Abnormal   Collection Time: 12/18/19  4:04 AM  Result Value Ref Range   WBC 4.6 4.0 - 10.5 K/uL   RBC 4.30 3.87 - 5.11 MIL/uL   Hemoglobin 13.1 12.0 - 15.0 g/dL   HCT 40.7 36.0 - 46.0 %   MCV 94.7 80.0 - 100.0 fL   MCH 30.5 26.0 - 34.0 pg   MCHC 32.2 30.0 - 36.0 g/dL  RDW 15.8 (H) 11.5 - 15.5 %   Platelets 24 (LL) 150 - 400 K/uL    Comment: SPECIMEN CHECKED FOR CLOTS CRITICAL VALUE NOTED.  VALUE IS CONSISTENT WITH PREVIOUSLY REPORTED AND CALLED VALUE. Immature Platelet Fraction may be clinically indicated, consider ordering this additional test [id number] PLATELET COUNT CONFIRMED BY SMEAR    nRBC 0.0 0.0 - 0.2 %    Comment: Performed at [facility], [place] [address], [place], [place] [id number]  Basic metabolic panel     Status: Abnormal   Collection Time: 12/18/19  4:04 AM  Result Value Ref Range   Sodium 139 135 - 145 mmol/L   Potassium 3.7 3.5 - 5.1 mmol/L    Comment: DELTA CHECK NOTED   Chloride 108 98 - 111 mmol/L   CO2 22 22 - 32 mmol/L   Glucose, Bld 194 (H) 70 - 99 mg/dL    Comment: Glucose reference range applies only to samples taken after fasting for at least 8 hours.   BUN 10 6 - 20 mg/dL   Creatinine, Ser 0.50 0.44 - 1.00 mg/dL   Calcium 8.9  8.9 - 10.3 mg/dL   GFR calc non Af Amer >60 >60 mL/min   GFR calc Af Amer >60 >60 mL/min   Anion gap 9 5 - 15    Comment: Performed at [facility], [place] [address], [place], [place] [address]    Studies/Results: US Abdomen Complete  Result Date: 12/16/2019 CLINICAL DATA:  Alcoholic cirrhosis, thrombocytopenia, esophageal varices, GERD, kidney stones, substance abuse EXAM: ABDOMEN ULTRASOUND COMPLETE COMPARISON:  04/11/2019 FINDINGS: Gallbladder: 2.3 cm diameter gallstone in gallbladder. Gallbladder wall appears mildly thickened. Minimal pericholecystic fluid. No sonographic Murphy sign. Common bile duct: Diameter: 5 mm, normal Liver: Nodular liver with coarsened echogenicity consistent with cirrhosis. No focal hepatic mass or nodule. Portal vein is patent on color Doppler imaging with normal direction of blood flow towards the liver. IVC: Short segment of intrahepatic IVC normal appearance, remainder obscured by bowel gas Pancreas: Tail incompletely visualized due to bowel gas, visualized portion normal appearance Spleen: Enlarged, 13.5 cm length with calculated volume of 526 mL. No focal lesion. Right Kidney: Length: 11.9 cm. Normal morphology without mass or hydronephrosis. Left Kidney: Length: 11.7 cm. Normal morphology without mass or hydronephrosis. Abdominal aorta: Predominately obscured by bowel gas Other findings: No free fluid IMPRESSION: Cirrhotic appearing liver without mass. Cholelithiasis. Mild splenomegaly. Electronically Signed   By: Lavonia Dana M.D.   On: 12/16/2019 12:03    Medications: I have reviewed the patient's current medications.  Assessment: Cirrhosis related to hepatitis C and alcohol abuse On Epclusa  MELDNa 8  Thrombocytopenia, platelet count 24 Was given IV Solu-Medrol 125 mg on 12/16/2019 Was given IVIG x1 on 12/17/2019 with another dose scheduled for 12/19/2027  Plan: On regular diet, on pantoprazole 40 mg IV every 12 hours, on  thiamine/multivitamin/folic acid No evidence of melena/hematemesis and hemoglobin remains stable. GI will sign off, please recall as needed.  Ronnette Juniper, MD 12/18/2019, 9:36 AM

## 2019-12-18 NOTE — Plan of Care (Signed)
  Problem: Clinical Measurements: Goal: Ability to maintain clinical measurements within normal limits will improve Outcome: Progressing Goal: Will remain free from infection Outcome: Progressing Goal: Diagnostic test results will improve Outcome: Progressing Goal: Respiratory complications will improve Outcome: Progressing Goal: Cardiovascular complication will be avoided Outcome: Progressing   Problem: Coping: Goal: Level of anxiety will decrease Outcome: Progressing   Problem: Elimination: Goal: Will not experience complications related to bowel motility Outcome: Progressing Goal: Will not experience complications related to urinary retention Outcome: Progressing

## 2019-12-18 NOTE — Progress Notes (Signed)
Secondary tubing with Privigen was filled with precipitate and did not infuse when the day shift nurse started the infusion. I had to detach the secondary line and the pharmacy advised me to discard the medication. I will wait for further instructions.

## 2019-12-19 ENCOUNTER — Other Ambulatory Visit: Payer: Self-pay

## 2019-12-19 DIAGNOSIS — Y9389 Activity, other specified: Secondary | ICD-10-CM | POA: Insufficient documentation

## 2019-12-19 DIAGNOSIS — F10920 Alcohol use, unspecified with intoxication, uncomplicated: Secondary | ICD-10-CM | POA: Insufficient documentation

## 2019-12-19 DIAGNOSIS — Y92018 Other place in single-family (private) house as the place of occurrence of the external cause: Secondary | ICD-10-CM | POA: Insufficient documentation

## 2019-12-19 DIAGNOSIS — R109 Unspecified abdominal pain: Secondary | ICD-10-CM | POA: Insufficient documentation

## 2019-12-19 DIAGNOSIS — Y907 Blood alcohol level of 200-239 mg/100 ml: Secondary | ICD-10-CM | POA: Insufficient documentation

## 2019-12-19 DIAGNOSIS — S0990XA Unspecified injury of head, initial encounter: Secondary | ICD-10-CM | POA: Insufficient documentation

## 2019-12-19 DIAGNOSIS — F1210 Cannabis abuse, uncomplicated: Secondary | ICD-10-CM | POA: Insufficient documentation

## 2019-12-19 DIAGNOSIS — M542 Cervicalgia: Secondary | ICD-10-CM | POA: Insufficient documentation

## 2019-12-19 DIAGNOSIS — Z79899 Other long term (current) drug therapy: Secondary | ICD-10-CM | POA: Insufficient documentation

## 2019-12-19 DIAGNOSIS — D696 Thrombocytopenia, unspecified: Secondary | ICD-10-CM | POA: Insufficient documentation

## 2019-12-19 DIAGNOSIS — W108XXA Fall (on) (from) other stairs and steps, initial encounter: Secondary | ICD-10-CM | POA: Insufficient documentation

## 2019-12-19 DIAGNOSIS — K921 Melena: Secondary | ICD-10-CM | POA: Insufficient documentation

## 2019-12-19 DIAGNOSIS — R519 Headache, unspecified: Secondary | ICD-10-CM | POA: Insufficient documentation

## 2019-12-19 DIAGNOSIS — Y998 Other external cause status: Secondary | ICD-10-CM | POA: Insufficient documentation

## 2019-12-19 LAB — CBC
HCT: 38.8 % (ref 36.0–46.0)
Hemoglobin: 12.7 g/dL (ref 12.0–15.0)
MCH: 31.3 pg (ref 26.0–34.0)
MCHC: 32.7 g/dL (ref 30.0–36.0)
MCV: 95.6 fL (ref 80.0–100.0)
Platelets: 21 10*3/uL — CL (ref 150–400)
RBC: 4.06 MIL/uL (ref 3.87–5.11)
RDW: 15.7 % — ABNORMAL HIGH (ref 11.5–15.5)
WBC: 5.1 10*3/uL (ref 4.0–10.5)
nRBC: 0 % (ref 0.0–0.2)

## 2019-12-19 LAB — BASIC METABOLIC PANEL
Anion gap: 6 (ref 5–15)
BUN: 14 mg/dL (ref 6–20)
CO2: 22 mmol/L (ref 22–32)
Calcium: 9 mg/dL (ref 8.9–10.3)
Chloride: 110 mmol/L (ref 98–111)
Creatinine, Ser: 0.56 mg/dL (ref 0.44–1.00)
GFR calc Af Amer: >60 mL/min (ref >60)
GFR calc non Af Amer: >60 mL/min (ref >60)
Glucose, Bld: 123 mg/dL — ABNORMAL HIGH (ref 70–99)
Potassium: 3.6 mmol/L (ref 3.5–5.1)
Sodium: 138 mmol/L (ref 135–145)

## 2019-12-19 MED ORDER — DIPHENHYDRAMINE HCL 50 MG/ML IJ SOLN
25.0000 mg | Freq: Once | INTRAMUSCULAR | Status: AC
  Administered 2019-12-19: 25 mg via INTRAVENOUS
  Filled 2019-12-19: qty 1

## 2019-12-19 MED ORDER — THIAMINE HCL 100 MG PO TABS: 100.0000 mg | ORAL_TABLET | Freq: Every day | ORAL | 3 refills | Status: DC

## 2019-12-19 MED ORDER — PANTOPRAZOLE SODIUM 40 MG PO TBEC: 40.0000 mg | DELAYED_RELEASE_TABLET | Freq: Every day | ORAL | 3 refills | Status: DC

## 2019-12-19 MED ORDER — METHYLPREDNISOLONE SODIUM SUCC 125 MG IJ SOLR
125.0000 mg | Freq: Once | INTRAMUSCULAR | Status: AC
  Administered 2019-12-19: 125 mg via INTRAVENOUS
  Filled 2019-12-19: qty 2

## 2019-12-19 NOTE — Discharge Summary (Signed)
Physician Discharge Summary   Patient ID: Briana Sweeney MRN: [id number] DOB/AGE: 10/05/1970 49 y.o.  Admit date: 12/15/2019 Discharge date: 12/19/2019  Primary Care Physician:  [name], FNP   Recommendations for Outpatient Follow-up:  1. Follow up with PCP in 1-2 weeks 2. Follow-up with Dr. Marin Olp, needs a CBC in 1 week 3. Patient strongly counseled on alcohol abstinence  Home Health: None  Equipment/Devices:   Discharge Condition: stable  CODE STATUS: FULL Diet recommendation: Heart healthy diet   Discharge Diagnoses:    . Acute on chronic thrombocytopenia (HCC) secondary to alcoholic cirrhosis of the liver Upper GI bleed with known history of thrombocytopenia, cirrhosis . Alcohol abuse . Alcoholic cirrhosis of liver with ascites ([place]) . GERD (gastroesophageal reflux disease) . Pancytopenia ([place]) . Acute cystitis Chronic, Hepatitis C  Consults: Gastroenterology Hematology, Dr. Marin Olp    Allergies:   Allergies  Allergen Reactions  . Other     Platelets: Rx chest pain, tremors, body aches     DISCHARGE MEDICATIONS: Allergies as of 12/19/2019      Reactions   Other    Platelets: Rx chest pain, tremors, body aches      Medication List    STOP taking these medications   amLODipine 5 MG tablet Commonly known as: NORVASC   ibuprofen 800 MG tablet Commonly known as: ADVIL     TAKE these medications   alum & mag hydroxide-simeth 200-200-20 MG/5ML suspension Commonly known as: MAALOX/MYLANTA Take 15 mLs by mouth every 4 (four) hours as needed for indigestion or heartburn.   ARIPiprazole 5 MG tablet Commonly known as: ABILIFY TAKE 1 TABLET BY MOUTH ONCE A DAY   clonazePAM 1 MG tablet Commonly known as: KLONOPIN Take 1 tablet (1 mg total) by mouth 2 (two) times daily as needed for anxiety.   FLUoxetine 40 MG capsule Commonly known as: PROZAC Take 1 capsule (40 mg total) by mouth daily.   folic acid 1 MG tablet Commonly known as:  FOLVITE Take 1 tablet (1 mg total) by mouth daily.   furosemide 40 MG tablet Commonly known as: Lasix Take 1 tablet (40 mg total) by mouth daily.   gabapentin 300 MG capsule Commonly known as: NEURONTIN Take 2 capsules 2 times a day. What changed:   how much to take  how to take this  when to take this  additional instructions   hydrOXYzine 25 MG tablet Commonly known as: ATARAX/VISTARIL Take 1 tablet (25 mg total) by mouth every 6 (six) hours as needed for itching.   lactulose 10 GM/15ML solution Commonly known as: CHRONULAC Take 30 mLs (20 g total) by mouth 3 (three) times daily. Goal to have 2-3 BMs daily What changed:   when to take this  reasons to take this   levETIRAcetam 500 MG tablet Commonly known as: Keppra Take 1 tablet (500 mg total) by mouth 2 (two) times daily.   mirtazapine 30 MG tablet Commonly known as: REMERON Take 1 tablet (30 mg total) by mouth at bedtime.   multivitamin with minerals Tabs tablet Take 1 tablet by mouth daily.   pantoprazole 40 MG tablet Commonly known as: Protonix Take 1 tablet (40 mg total) by mouth daily before breakfast.   polyethylene glycol 17 g packet Commonly known as: MIRALAX / GLYCOLAX Take 17 g by mouth 2 (two) times daily. What changed:   when to take this  reasons to take this   Sofosbuvir-Velpatasvir 400-100 MG Tabs Commonly known as: Epclusa Take 1 tablet by  mouth daily. Take 1 tablet by mouth daily.   thiamine 100 MG tablet Take 1 tablet (100 mg total) by mouth daily.        Brief H and P: For complete details please refer to admission H and P, but in brief Patient is a 49 year old female with history of alcohol abuse, history of cirrhosis of liver due to alcohol and hepatitis C, depression, esophageal varices, GERD, history of seizures, presented to ED complaining of bleeding from the gums, black tarry stools for 1 week. Patient was sent to ED by PCP on noticing low platelets of 22K ED  work-up/course: In ED, AST 57 ALT 29, CBC WBCs 3.0, hemoglobin 12.6, hematocrit 39.3, platelets 23,000, plateletswere 22Kon 4/5 In ED, had allergic reaction to platelet transfusion   Hospital Course:    Acute on chronic thrombocytopenia (HCC)with pancytopenia secondary to alcoholic cirrhosis of the liver -Presented with symptomatic thrombocytopenia with upper GI bleed with black tarry stools for 1 week, gum bleeding -Received platelet transfusion on 4/7, 4/8 with no significant improvement.  Hematology was consulted, Dr. Marin Olp recommended Nplate and IVIG with steroids -Patient received Nplate x1 and IVIG x2 on 4/10 and 4/11. -Per Dr. Marin Olp, no need of platelets unless she is actively bleeding.  If she continues to keep drinking, she will continue to have these issues and will be back with recurrent admissions for the same.  She may have a component of immune thrombocytopenia along with marrow toxicity due to alcohol.  Patient was recommended to follow-up with Dr. Marin Olp in 1 week, repeat CBC.   Upper GI bleed, with known history of cirrhosis of the liver secondary to alcohol and hepatitis C, thrombocytopenia, pancytopenia -Patient reports black tarry stools for last 1 week, no hematemesis. She also has been taking ibuprofen for her foot pain -Supportive care, absolute alcohol abstinence -Abdominal ultrasound showed cholelithiasis, mild splenomegaly, cirrhotic appearing liver without mass.  Octreotide drip was discontinued, continue PPI and supportive care. GI signed off  Acute cystitis -Patient complaining of dysuria, urinary hesitancy, for last 1 week -UA and culture negative, will DC IV Rocephin -Patient was placed on Urispas, no further complaints  Alcohol abuse, polysubstance abuse -Patient still drinks 6 packs of beer every day -Counseled again on alcohol cessation, patient was placed on CIWA protocol with Ativan, currently not in acute withdrawals  Alcoholic  cirrhosis of liver with ascites ([employer]) -Continue lactulose, MiraLAX as needed  Chronic hepatitis C -Patient follows [facility], continue Epclusa  History of seizures Continue Keppra at outpatient dose  History of bipolar disorder Continue all psych medications   Day of Discharge S: No acute complaints, no further gum bleeding or GI bleed.  Hoping to go home after the second IVIG infusion.  BP (!) 134/96 (BP Location: Left Arm)   Pulse 69   Temp 98.4 F (36.9 C) (Oral)   Resp 20   Ht 5' (1.524 m)   Wt 65.9 kg   SpO2 96%   BMI 28.36 kg/m   Physical Exam: General: Alert and awake oriented x3 not in any acute distress. HEENT: anicteric sclera, pupils reactive to light and accommodation CVS: S1-S2 clear no murmur rubs or gallops Chest: clear to auscultation bilaterally, no wheezing rales or rhonchi Abdomen: soft nontender, nondistended, normal bowel sounds Extremities: no cyanosis, clubbing or edema noted bilaterally Neuro: Cranial nerves II-XII intact, no focal neurological deficits    Get Medicines reviewed and adjusted: Please take all your medications with you for your next visit with your Primary  MD  Please request your Primary MD to go over all hospital tests and procedure/radiological results at the follow up. Please ask your Primary MD to get all Hospital records sent to his/her office.  If you experience worsening of your admission symptoms, develop shortness of breath, life threatening emergency, suicidal or homicidal thoughts you must seek medical attention immediately by calling 911 or calling your MD immediately  if symptoms less severe.  You must read complete instructions/literature along with all the possible adverse reactions/side effects for all the Medicines you take and that have been prescribed to you. Take any new Medicines after you have completely understood and accept all the possible adverse reactions/side effects.   Do not drive when taking  pain medications.   Do not take more than prescribed Pain, Sleep and Anxiety Medications  Special Instructions: If you have smoked or chewed Tobacco  in the last 2 yrs please stop smoking, stop any regular Alcohol  and or any Recreational drug use.  Wear Seat belts while driving.  Please note  You were cared for by a hospitalist during your hospital stay. Once you are discharged, your primary care physician will handle any further medical issues. Please note that NO REFILLS for any discharge medications will be authorized once you are discharged, as it is imperative that you return to your primary care physician (or establish a relationship with a primary care physician if you do not have one) for your aftercare needs so that they can reassess your need for medications and monitor your lab values.   The results of significant diagnostics from this hospitalization (including imaging, microbiology, ancillary and laboratory) are listed below for reference.      Procedures/Studies:  CT Head Wo Contrast  Result Date: 12/15/2019 CLINICAL DATA:  Headache and thrombocytopenia. EXAM: CT HEAD WITHOUT CONTRAST TECHNIQUE: Contiguous axial images were obtained from the base of the skull through the vertex without intravenous contrast. COMPARISON:  05/06/2019 FINDINGS: Brain: No evidence of acute infarction, hemorrhage, hydrocephalus, extra-axial collection or mass lesion/mass effect. Prominence of sulci and ventricles compatible with brain atrophy. Unchanged calcification in left cerebellar hemisphere. Vascular: No hyperdense vessel or unexpected calcification. Skull: Normal. Negative for fracture or focal lesion. Sinuses/Orbits: The paranasal sinuses and mastoid air cells are clear. Other: None. IMPRESSION: 1. No acute intracranial abnormalities Electronically Signed   By: Kerby Moors M.D.   On: 12/15/2019 16:29   US Abdomen Complete  Result Date: 12/16/2019 CLINICAL DATA:  Alcoholic cirrhosis,  thrombocytopenia, esophageal varices, GERD, kidney stones, substance abuse EXAM: ABDOMEN ULTRASOUND COMPLETE COMPARISON:  04/11/2019 FINDINGS: Gallbladder: 2.3 cm diameter gallstone in gallbladder. Gallbladder wall appears mildly thickened. Minimal pericholecystic fluid. No sonographic Murphy sign. Common bile duct: Diameter: 5 mm, normal Liver: Nodular liver with coarsened echogenicity consistent with cirrhosis. No focal hepatic mass or nodule. Portal vein is patent on color Doppler imaging with normal direction of blood flow towards the liver. IVC: Short segment of intrahepatic IVC normal appearance, remainder obscured by bowel gas Pancreas: Tail incompletely visualized due to bowel gas, visualized portion normal appearance Spleen: Enlarged, 13.5 cm length with calculated volume of 526 mL. No focal lesion. Right Kidney: Length: 11.9 cm. Normal morphology without mass or hydronephrosis. Left Kidney: Length: 11.7 cm. Normal morphology without mass or hydronephrosis. Abdominal aorta: Predominately obscured by bowel gas Other findings: No free fluid IMPRESSION: Cirrhotic appearing liver without mass. Cholelithiasis. Mild splenomegaly. Electronically Signed   By: Lavonia Dana M.D.   On: 12/16/2019 12:03   DG Foot  Complete Right  Result Date: 12/01/2019 Please see detailed radiograph report in office note.      LAB RESULTS: Basic Metabolic Panel: Recent Labs  Lab 12/16/19 0443 12/17/19 0428 12/18/19 0404 12/19/19 0448  NA 136   < > 139 138  K 3.9   < > 3.7 3.6  CL 101   < > 108 110  CO2 21*   < > 22 22  GLUCOSE 203*   < > 194* 123*  BUN 7   < > 10 14  CREATININE 0.53   < > 0.50 0.56  CALCIUM 8.5*   < > 8.9 9.0  MG 1.6*  --   --   --   PHOS 3.6  --   --   --    < > = values in this interval not displayed.   Liver Function Tests: Recent Labs  Lab 12/15/19 1332 12/15/19 1828  AST 57* 54*  ALT 29 24  ALKPHOS 180* 163*  BILITOT 1.0 1.4*  PROT 7.4 6.8  ALBUMIN 3.6 3.2*   No results for  input(s): LIPASE, AMYLASE in the last 168 hours. No results for input(s): AMMONIA in the last 168 hours. CBC: Recent Labs  Lab 12/13/19 1603 12/15/19 1332 12/18/19 0404 12/18/19 0404 12/19/19 0448  WBC 3.4   < > 4.6  --  5.1  NEUTROABS 2.3  --   --   --   --   HGB 13.9   < > 13.1  --  12.7  HCT 40.0   < > 40.7  --  38.8  MCV 89   < > 94.7   < > 95.6  PLT 22*   < > 24*  --  21*   < > = values in this interval not displayed.   Cardiac Enzymes: No results for input(s): CKTOTAL, CKMB, CKMBINDEX, TROPONINI in the last 168 hours. BNP: Invalid input(s): POCBNP CBG: Recent Labs  Lab 12/18/19 0335  GLUCAP 177*       Disposition and Follow-up: Discharge Instructions    Diet - low sodium heart healthy   Complete by: As directed    Discharge instructions   Complete by: As directed    Please stop alcohol use. Avoid NSAIDs (ibuprofen, motrin aleve etc). Follow up next week with Dr Marin Olp.   Increase activity slowly   Complete by: As directed        DISPOSITION: Home   DISCHARGE FOLLOW-UP Follow-up Information    Volanda Napoleon, MD. Schedule an appointment as soon as possible for a visit in 1 week(s).   Specialty: Oncology Why: please call on Monday for the appointment. Need CBC to check the platelet counts Contact information: [address] [place] [address] [phone]        [name], FNP. Schedule an appointment as soon as possible for a visit in 1 week(s).   Specialty: Family Medicine Contact information: [address] [place] [address] [phone]            Time coordinating discharge:  35-minutes  Signed:   Estill Cotta M.D. [facility] 12/19/2019, 12:58 PM

## 2019-12-19 NOTE — Progress Notes (Addendum)
Pt was receiving IVIG into PIV right hand. There was 267mL left in bag when IV infiltrated while pt was ambulating to bathroom. This RN was at the bedside, immediately stopped IVIG, DC'd PIV, elevated pt's right hand and applied ice to site. RN called pharmacy and notified them of infiltration, pharmacist advised no other interventions except for elevation and ice. Pt refused new IV in order to finish IVIG dose. Pt wanted to get discharged. MD notified of all events. MD suggested if all interventions are done and pt stable, she may be discharged. RN marked pt's infiltration site and advised pt to observe the site. Infiltration site looking better before pt discharge, swelling decreased. RN marked area of 7cm long and 5.5cm wide. Ice applied, arm elevated, pt discharged home.

## 2019-12-20 ENCOUNTER — Emergency Department (HOSPITAL_COMMUNITY): Payer: Medicaid Other

## 2019-12-20 ENCOUNTER — Encounter (HOSPITAL_COMMUNITY): Payer: Self-pay

## 2019-12-20 ENCOUNTER — Emergency Department (HOSPITAL_COMMUNITY)
Admission: EM | Admit: 2019-12-20 | Discharge: 2019-12-20 | Disposition: A | Discharge status: Home / Self Care | Payer: Medicaid Other | Attending: Emergency Medicine | Admitting: Emergency Medicine

## 2019-12-20 ENCOUNTER — Encounter: Payer: Medicaid Other | Admitting: Podiatry

## 2019-12-20 ENCOUNTER — Other Ambulatory Visit: Payer: Self-pay

## 2019-12-20 DIAGNOSIS — W19XXXA Unspecified fall, initial encounter: Secondary | ICD-10-CM

## 2019-12-20 DIAGNOSIS — F10920 Alcohol use, unspecified with intoxication, uncomplicated: Secondary | ICD-10-CM

## 2019-12-20 DIAGNOSIS — D696 Thrombocytopenia, unspecified: Secondary | ICD-10-CM

## 2019-12-20 LAB — CBC WITH DIFFERENTIAL/PLATELET
Abs Immature Granulocytes: 0.02 10*3/uL (ref 0.00–0.07)
Basophils Absolute: 0 10*3/uL (ref 0.0–0.1)
Basophils Relative: 0 %
Eosinophils Absolute: 0 10*3/uL (ref 0.0–0.5)
Eosinophils Relative: 0 %
HCT: 39.4 % (ref 36.0–46.0)
Hemoglobin: 12.9 g/dL (ref 12.0–15.0)
Immature Granulocytes: 0 %
Lymphocytes Relative: 9 %
Lymphs Abs: 0.4 10*3/uL — ABNORMAL LOW (ref 0.7–4.0)
MCH: 30.9 pg (ref 26.0–34.0)
MCHC: 32.7 g/dL (ref 30.0–36.0)
MCV: 94.5 fL (ref 80.0–100.0)
Monocytes Absolute: 0.3 10*3/uL (ref 0.1–1.0)
Monocytes Relative: 7 %
Neutro Abs: 3.9 10*3/uL (ref 1.7–7.7)
Neutrophils Relative %: 84 %
Platelets: 24 10*3/uL — CL (ref 150–400)
RBC: 4.17 MIL/uL (ref 3.87–5.11)
RDW: 15.8 % — ABNORMAL HIGH (ref 11.5–15.5)
WBC: 4.7 10*3/uL (ref 4.0–10.5)
nRBC: 0 % (ref 0.0–0.2)

## 2019-12-20 LAB — TYPE AND SCREEN
ABO/RH(D): O POS
Antibody Screen: NEGATIVE

## 2019-12-20 LAB — CULTURE, BLOOD (ROUTINE X 2)
Culture: NO GROWTH
Culture: NO GROWTH
Special Requests: ADEQUATE
Special Requests: ADEQUATE

## 2019-12-20 LAB — COMPREHENSIVE METABOLIC PANEL
ALT: 34 U/L (ref 0–44)
AST: 36 U/L (ref 15–41)
Albumin: 3.2 g/dL — ABNORMAL LOW (ref 3.5–5.0)
Alkaline Phosphatase: 134 U/L — ABNORMAL HIGH (ref 38–126)
Anion gap: 10 (ref 5–15)
BUN: 11 mg/dL (ref 6–20)
CO2: 19 mmol/L — ABNORMAL LOW (ref 22–32)
Calcium: 8.8 mg/dL — ABNORMAL LOW (ref 8.9–10.3)
Chloride: 113 mmol/L — ABNORMAL HIGH (ref 98–111)
Creatinine, Ser: 0.58 mg/dL (ref 0.44–1.00)
GFR calc Af Amer: >60 mL/min (ref >60)
GFR calc non Af Amer: >60 mL/min (ref >60)
Glucose, Bld: 88 mg/dL (ref 70–99)
Potassium: 3.4 mmol/L — ABNORMAL LOW (ref 3.5–5.1)
Sodium: 142 mmol/L (ref 135–145)
Total Bilirubin: 0.8 mg/dL (ref 0.3–1.2)
Total Protein: 9.5 g/dL — ABNORMAL HIGH (ref 6.5–8.1)

## 2019-12-20 LAB — PROTIME-INR
INR: 1.1 (ref 0.8–1.2)
Prothrombin Time: 14.3 seconds (ref 11.4–15.2)

## 2019-12-20 LAB — POC OCCULT BLOOD, ED: Fecal Occult Bld: POSITIVE — AB

## 2019-12-20 LAB — ETHANOL: Alcohol, Ethyl (B): 210 mg/dL — ABNORMAL HIGH (ref <10)

## 2019-12-20 MED ORDER — IOHEXOL 300 MG/ML  SOLN
100.0000 mL | Freq: Once | INTRAMUSCULAR | Status: AC | PRN
  Administered 2019-12-20: 100 mL via INTRAVENOUS

## 2019-12-20 NOTE — ED Notes (Signed)
Pt ambulatory to bathroom with no assistance. Pt complained of dizziness during fast gait to and from BR.

## 2019-12-20 NOTE — ED Provider Notes (Signed)
[facility] DEPT Provider Note   CSN: [id number] Arrival date & time: 12/19/19  2357     History Chief Complaint  Patient presents with  . Vaginal Bleeding    Briana Sweeney is a 49 y.o. female.  Patient with a history of alcoholic liver disease, severe thrombocytopenia secondary to same, continuous alcohol abuse, recent admission for GI bleeding for treatment of low platelets, presents after discharge from the hospital yesterday. She admits to alcohol use since discharge. She returns to the hospital now for evaluation after falling down 12 steps at home concerned about bruising and bleeding. She states she still has very dark stool and is now bleeding from her vagina. Last menses was 3 years ago. She reports headache, neck pain, abdominal pain. No vomiting. She has been ambulatory since the fall. She denies syncope.   The history is provided by the patient. No language interpreter was used.  Vaginal Bleeding Associated symptoms: abdominal pain   Associated symptoms: no fever        Past Medical History:  Diagnosis Date  . Alcohol use   . Anxiety   . Bipolar affective disorder ([place])    With anxiety features  . Bunionette of right foot 11/2019  . Cirrhosis of liver ([place])    Due to alcohol and hepatitis C  . Depression   . 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 ETOHism ([place])   . GERD (gastroesophageal reflux disease)   . Hematemesis 02/10/2018  . Hepatitis C 2018   hepatitis c and alcohol related hepatitis  . Heroin abuse ([place])   . History of blood transfusion    "blood doesn't clot; I fell down and had to have a transfusion"  . History of kidney stones   . History of thrombocytopenia   . Menopause 2016  . Migraine    "when I get really stressed" (09/01/2017)  . S/P foot surgery, right 11/2019  . Schizophrenia ([place])   . Seizures ([place])    "when I run out of my RX; lots recently" (09/01/2017)  . Thrombocytopenia [facility])      Patient Active Problem List   Diagnosis Date Noted  . Abnormal urine 12/13/2019  . History of alcohol abuse 09/07/2019  . Insomnia 05/05/2019  . Dizziness 04/02/2019  . Bruising 04/02/2019  . Hyponatremia 04/02/2019  . Head injury   . Scalp laceration, initial encounter   . Fall 01/20/2019  . Epistaxis 01/20/2019  . Altered mental status   . 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 Other mixed anxiety disorders 10/27/2018  . Neuropathy 10/27/2018  . Abscess of bursa of left elbow 09/23/2018  . Olecranon bursitis of left elbow   . Erysipelas 09/13/2018  . Acute metabolic encephalopathy 0000000  . Overdose 08/22/2018  . Hypotension 08/22/2018  . Seizure ([place]) 08/22/2018  . Substance induced mood disorder ([place]) 07/17/2018  . Cocaine dependence without complication ([place]) [id number]  . Polysubstance abuse ([place]) 04/08/2018  . Encephalopathy, 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[place]) 04/08/2018  . Hepatitis C 03/18/2018  . Portal hypertension ([place]) 03/18/2018  . Alcoholic intoxication without complication ([place]) [id number]  . Depression 03/18/2018  . Upper GI bleed 02/10/2018  . Alcohol use disorder, severe, in early remission ([place]) 02/10/2018  . Chronic anemia   . Thrombocytopenia due to drugs   . Suicidal ideation   . Thrombocytopenia ([place]) 01/02/2018  . GERD (gastroesophageal reflux disease) 11/01/2017  . Trichimoniasis 10/30/2017  . Alcohol dependence ([place]) 10/29/2017  . MDD (major depressive disorder), severe ([place]) 10/28/2017  .  Acute hyperactive alcohol withdrawal delirium ([place]) 10/09/2017  . Schizophrenia ([place]) 10/09/2017  . Alcohol abuse with alcohol-induced mood disorder ([place])   . Alcohol withdrawal syndrome with complication ([place]) [id number]  . Esophageal varices without bleeding ([place])   . Hematemesis 09/01/2017  . Ascites due to alcoholic cirrhosis ([place])   . Decompensated hepatic cirrhosis ([place]) 07/23/2017  . Alcohol abuse 07/23/2017  . Hepatitis C antibody positive in blood  07/23/2017  . Hypokalemia 07/23/2017  . Jaundice 07/23/2017  . Coagulopathy ([place]) 07/23/2017  . Hypomagnesemia 07/23/2017  . Pancytopenia ([place]) 07/23/2017  . Alcoholic cirrhosis of liver with ascites ([place]) 07/23/2017  . Bacterial vaginosis 06/03/2017  . UTI (urinary tract infection) 06/02/2017    Past Surgical History:  Procedure Laterality Date  . ESOPHAGOGASTRODUODENOSCOPY N/A 09/03/2017   Procedure: ESOPHAGOGASTRODUODENOSCOPY (EGD);  Surgeon: Doran Stabler, MD;  Location: [facility];  Service: Gastroenterology;  Laterality: N/A;  . FINGER FRACTURE SURGERY Left    "shattered my pinky"  . FRACTURE SURGERY    . I & D EXTREMITY Left 09/18/2018   Procedure: IRRIGATION AND DEBRIDEMENT EXTREMITY;  Surgeon: Leanora Cover, MD;  Location: [facility];  Service: Orthopedics;  Laterality: Left;  . IR PARACENTESIS  07/23/2017  . IR PARACENTESIS  07/2017   "did it twice in the same week" (09/01/2017)  . SHOULDER OPEN ROTATOR CUFF REPAIR Right   . TOOTH EXTRACTION  08/2019  . TUBAL LIGATION    . VAGINAL HYSTERECTOMY       OB History    Gravida  3   Para      Term      Preterm      AB      Living  2     SAB      TAB      Ectopic      Multiple      Live Births              Family History  Problem Relation Age of Onset  . Lung cancer Mother 27  . Alcohol abuse Mother   . Throat cancer Father 77    Social History   Tobacco Use  . Smoking status: Never Smoker  . Smokeless tobacco: Never Used  Substance Use Topics  . Alcohol use: Yes    Alcohol/week: 63.0 standard drinks    Types: 63 Cans of beer per week    Comment: weekly "I have cut back"; 4 days 4 vs 12 pack   . Drug use: Yes    Types: Marijuana    Comment: cocaiane not currently    Home Medications Prior to Admission medications   Medication Sig Start Date End Date Taking? Authorizing Provider  alum & mag hydroxide-simeth (MAALOX/MYLANTA) 200-200-20 MG/5ML suspension Take 15 mLs by mouth every 4  (four) hours as needed for indigestion or heartburn. 04/13/19   Hosie Poisson, MD  ARIPiprazole (ABILIFY) 5 MG tablet TAKE 1 TABLET BY MOUTH ONCE A DAY Patient taking differently: Take 5 mg by mouth daily.  11/09/19   Pucilowski, Marchia Bond, MD  clonazePAM (KLONOPIN) 1 MG tablet Take 1 tablet (1 mg total) by mouth 2 (two) times daily as needed for anxiety. 11/15/19 01/14/20  Pucilowski, Marchia Bond, MD  FLUoxetine (PROZAC) 40 MG capsule Take 1 capsule (40 mg total) by mouth daily. 11/15/19   Pucilowski, Marchia Bond, MD  folic acid (FOLVITE) 1 MG tablet Take 1 tablet (1 mg total) by mouth daily. 04/13/19   Hosie Poisson, MD  furosemide (LASIX)  40 MG tablet Take 1 tablet (40 mg total) by mouth daily. 12/13/19 08/09/20  [name], FNP  gabapentin (NEURONTIN) 300 MG capsule Take 2 capsules 2 times a day. Patient taking differently: Take 600 mg by mouth 2 (two) times daily.  08/11/19   [name], FNP  hydrOXYzine (ATARAX/VISTARIL) 25 MG tablet Take 1 tablet (25 mg total) by mouth every 6 (six) hours as needed for itching. 12/13/19   [name], FNP  lactulose (CHRONULAC) 10 GM/15ML solution Take 30 mLs (20 g total) by mouth 3 (three) times daily. Goal to have 2-3 BMs daily Patient taking differently: Take 20 g by mouth 3 (three) times daily as needed for moderate constipation. Goal to have 2-3 BMs daily 04/13/19   Hosie Poisson, MD  levETIRAcetam (KEPPRA) 500 MG tablet Take 1 tablet (500 mg total) by mouth 2 (two) times daily. 11/09/19   Cameron Sprang, MD  mirtazapine (REMERON) 30 MG tablet Take 1 tablet (30 mg total) by mouth at bedtime. 11/15/19 01/14/20  Pucilowski, Marchia Bond, MD  Multiple Vitamin (MULTIVITAMIN WITH MINERALS) TABS tablet Take 1 tablet by mouth daily. 04/13/19   Hosie Poisson, MD  pantoprazole (PROTONIX) 40 MG tablet Take 1 tablet (40 mg total) by mouth daily before breakfast. 12/19/19 04/17/20  Rai, Ripudeep K, MD  polyethylene glycol (MIRALAX / GLYCOLAX) 17 g packet Take 17 g by mouth 2 (two)  times daily. Patient taking differently: Take 17 g by mouth daily as needed for mild constipation.  04/13/19   Hosie Poisson, MD  Sofosbuvir-Velpatasvir (EPCLUSA) 400-100 MG TABS Take 1 tablet by mouth daily. Take 1 tablet by mouth daily. 07/12/19   [place], FNP  thiamine 100 MG tablet Take 1 tablet (100 mg total) by mouth daily. 12/19/19   Mendel Corning, MD    Allergies    Other  Review of Systems   Review of Systems  Constitutional: Negative for chills and fever.  HENT: Negative.   Respiratory: Negative.  Negative for shortness of breath.   Cardiovascular: Negative.   Gastrointestinal: Positive for abdominal pain and blood in stool. Negative for vomiting.  Genitourinary: Positive for vaginal bleeding.  Musculoskeletal: Positive for neck pain.  Skin: Negative.  Negative for wound.  Neurological: Positive for headaches.  Hematological: Bruises/bleeds easily.    Physical Exam Updated Vital Signs BP (!) 131/97 (BP Location: Right Arm)   Pulse 75   Temp (!) 97.5 F (36.4 C) (Oral)   Resp 14   SpO2 95%   Physical Exam Vitals and nursing note reviewed.  Constitutional:      General: She is not in acute distress.    Appearance: She is well-developed.  HENT:     Head: Normocephalic and atraumatic.     Nose: Nose normal.  Eyes:     General: No scleral icterus.    Conjunctiva/sclera: Conjunctivae normal.     Comments: No conjunctival pallor.  Cardiovascular:     Rate and Rhythm: Normal rate and regular rhythm.     Heart sounds: No murmur.  Pulmonary:     Effort: Pulmonary effort is normal.     Breath sounds: Normal breath sounds. No wheezing, rhonchi or rales.     Comments: No bruising of chest wall Chest:     Chest wall: No tenderness.  Abdominal:     General: Bowel sounds are normal.     Palpations: Abdomen is soft.     Tenderness: There is abdominal tenderness. There is no guarding  or rebound.     Comments: Tender across lower abdomen and right side abdomen.  No bruising of abdominal wall.   Musculoskeletal:        General: Normal range of motion.     Cervical back: Normal range of motion and neck supple.     Comments: Midline cervical tenderness without bony deformity. FROM extremities without bony tenderness or deformity.   Skin:    General: Skin is warm and dry.     Findings: No rash.  Neurological:     Mental Status: She is alert and oriented to person, place, and time.     ED Results / Procedures / Treatments   Labs (all labs ordered are listed, but only abnormal results are displayed) Labs Reviewed  CBC WITH DIFFERENTIAL/PLATELET  ETHANOL  COMPREHENSIVE METABOLIC PANEL  PROTIME-INR  TYPE AND SCREEN    EKG None  Radiology No results found.  Procedures Procedures (including critical care time)  Medications Ordered in ED Medications - No data to display  ED Course  I have reviewed the triage vital signs and the nursing notes.  Pertinent labs & imaging results that were available during my care of the patient were reviewed by me and considered in my medical decision making (see chart for details).    MDM Rules/Calculators/A&P                      Patient with history of alcoholism, cirrhosis, severe thrombocytopenia presents back to hospital after fall down a flight of steps while at home. Admits to ongoing alcohol use today since leaving the hospital from hospitalization for thrombocytopenia.   She is at significant risk of bleeding/injury after fall with severe thrombocytopenia. CT scans of head, neck, chest and abd/pel are, however, negative.   Vaginal exam does not show any bleeding. She has guaiac positive stool that is trace positive with light brown stool. Hgb stable.   VSS. She is felt appropriate for discharge home. Cautioned against continued alcohol use with her multiple medical problems, and increased fall risk.   Final Clinical Impression(s) / ED Diagnoses Final diagnoses:  None   1. Alcohol  intoxication 2. Fall 3. Thrombocytopenia  Rx / DC Orders ED Discharge Orders    None       Dennie Bible 12/20/19 0622    Orpah Greek, MD 12/20/19 [phone]

## 2019-12-20 NOTE — ED Triage Notes (Addendum)
Patient came in with EMS from home c/o Vaginal bleeding. Pt reports vaginal bleeding and dark stool 3-4x tonight. Pt reports she got discharge yesterday afternoon.Patient A/Ox4.

## 2019-12-20 NOTE — Discharge Instructions (Addendum)
Follow up as per your discharge instructions from your recent hospitalization for low platelets and liver disease.   If you have new or concerning symptoms, please return to the emergency department.

## 2019-12-21 ENCOUNTER — Telehealth: Payer: Self-pay | Admitting: Pharmacy Technician

## 2019-12-21 NOTE — Telephone Encounter (Signed)
[facility] Patient Advocate Encounter  Patient left a voicemail yesterday concerned that she received notification from [facility] Medicaid that the [place] prior authorization has been denied.   In looking at her refill history she should have all six refills of her treatment at this point. She has a follow up EOT appointment in May. Refill history from [facility] listed below:  07/19/2019 08/16/2019 09/08/2019 10/12/2019 11/15/2019 12/07/2019  I attempted to call her back to indicate this information but had to leave a voicemail.

## 2019-12-22 ENCOUNTER — Ambulatory Visit: Payer: Medicaid Other | Attending: Internal Medicine

## 2019-12-22 DIAGNOSIS — Z23 Encounter for immunization: Secondary | ICD-10-CM

## 2019-12-22 NOTE — Progress Notes (Signed)
   Covid-19 Vaccination Clinic  Name:  Briana Sweeney    MRN: [id number] DOB: 1971/02/14  12/22/2019  [name] was observed post Covid-19 immunization for 15 minutes without incident. She was provided with Vaccine Information Sheet and instruction to access the V-Safe system.   [name] was instructed to call 911 with any severe reactions post vaccine: [name] Kitchen Difficulty breathing  . Swelling of face and throat  . A fast heartbeat  . A bad rash all over body  . Dizziness and weakness   Immunizations Administered    Name Date Dose VIS Date Route   Pfizer COVID-19 Vaccine 12/22/2019 12:39 PM 0.3 mL 08/20/2019 Intramuscular   Manufacturer: [place]   Lot: [id number]   [place]: [id number]

## 2019-12-30 ENCOUNTER — Other Ambulatory Visit: Payer: Self-pay

## 2019-12-30 ENCOUNTER — Telehealth (INDEPENDENT_AMBULATORY_CARE_PROVIDER_SITE_OTHER): Payer: Medicaid Other | Admitting: Psychiatry

## 2019-12-30 DIAGNOSIS — F1994 Other psychoactive substance use, unspecified with psychoactive substance-induced mood disorder: Secondary | ICD-10-CM | POA: Diagnosis not present

## 2019-12-30 DIAGNOSIS — F1029 Alcohol dependence with unspecified alcohol-induced disorder: Secondary | ICD-10-CM | POA: Diagnosis not present

## 2019-12-30 DIAGNOSIS — F413 Other mixed anxiety disorders: Secondary | ICD-10-CM | POA: Diagnosis not present

## 2019-12-30 MED ORDER — CLONAZEPAM 1 MG PO TABS: 1.0000 mg | ORAL_TABLET | Freq: Two times a day (BID) | ORAL | 1 refills | Status: DC | PRN

## 2019-12-30 MED ORDER — ARIPIPRAZOLE 5 MG PO TABS: 5.0000 mg | ORAL_TABLET | Freq: Every day | ORAL | 1 refills | Status: DC

## 2019-12-30 MED ORDER — MIRTAZAPINE 30 MG PO TABS: 30.0000 mg | ORAL_TABLET | Freq: Every day | ORAL | 1 refills | Status: DC

## 2019-12-30 MED FILL — MIRTAZAPINE 30 MG TABLET: 30 | 30 days supply | Qty: 30 | Fill #0

## 2019-12-30 MED FILL — ARIPIPRAZOLE 5 MG TABS: 5 | 30 days supply | Qty: 30 | Fill #0

## 2019-12-30 NOTE — Progress Notes (Signed)
[facility] MD/PA/NP OP Progress Note  12/30/2019 9:15 AM Briana Sweeney  MRN:  [id number] Interview was conducted by phone and I verified that I was speaking with the correct person using two identifiers. I discussed the limitations of evaluation and management by telemedicine and  the availability of in person appointments. Patient expressed understanding and agreed to proceed.  Chief Complaint: "My anxiety is much better".  HPI: 49 yo divorced female with a long hx of alcohol use disorder, polysubstance abuse (cocaine, heroin), and past diagnoses of major depressive disorder vs substance-induced mood disorder vs bipolar disorder. Diagnosis of schizophrenia is also mentioned in the record but patient is unable to provide any clear history of psychotic episodes unrelated to her drug/alcohol use. She was first treated for mental problems at age 6 when she became depressed and OD on meds. She has been taking fluoxetine (up to 60 mg) for over 20 years per her report. She also tried buspirone, lorazepam and hydroxyzine for anxiety with no clear benefit. Recently prescribed trazodone for insomnia does not appear to work either. She has never been on any mood stabilizers except for lithium or antipsychotics apart from haloperidol. She did not like how she felt on either of those. She started abusing alcohol at age of 12. At one time she was drinking a 1/2 gallon of vodka plus some beer on a daily basis. She has a hx of alcohol withdrawal delirium. She has some periods of sobriety lasting few months at a time but cannot provide more details. She has alcohol liver cirrhosis, thrombocytopenia and hepatitis C. She minimizes use of illicit drugs claiming that she only used them when "partying".We have added mirtazapine and she reports that sleep has improved. She continues to complain primarily of severe anxiety both generalized and panic type. She has no support, struggles financially. Briana Sweeney reports that she has not been  drinking alcohol sinceearly September2020 however two weeks ago she went to a Charity fundraiser" and got drunk (BAL 210). It is unclear how truthful she is in her reports about alcohol use. She no longer is taking lactulose or topiramate. She has not been taking buspirone but reports that clonazepam prescribed recently is very helpful for anxiety and helps her stay away from alcohol?    Visit Diagnosis:    ICD-10-CM   1. Other mixed anxiety disorders  F41.3   2. Alcohol dependence with unspecified alcohol-induced disorder ([facility])  F10.29   3. Substance induced mood disorder (HCC)  F19.94     Past Psychiatric History: Please see intake H&P.  Past Medical History:  Past Medical History:  Diagnosis Date  . Alcohol use   . Anxiety   . Bipolar affective disorder ([facility])    With anxiety features  . Bunionette of right foot 11/2019  . Cirrhosis of liver ([facility])    Due to alcohol and hepatitis C  . Depression   . 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 ETOHism ([facility])   . GERD (gastroesophageal reflux disease)   . Hematemesis 02/10/2018  . Hepatitis C 2018   hepatitis c and alcohol related hepatitis  . Heroin abuse ([facility] [facility])   . History of blood transfusion    "blood doesn't clot; I fell down and had to have a transfusion"  . History of kidney stones   . History of thrombocytopenia   . Menopause 2016  . Migraine    "when I get really stressed" (09/01/2017)  . S/P foot surgery, right 11/2019  . Schizophrenia ([facility])   .  Seizures ([facility] [facility])    "when I run out of my RX; lots recently" (09/01/2017)  . Thrombocytopenia ([facility])     Past Surgical History:  Procedure Laterality Date  . ESOPHAGOGASTRODUODENOSCOPY N/A 09/03/2017   Procedure: ESOPHAGOGASTRODUODENOSCOPY (EGD);  Surgeon: Doran Stabler, MD;  Location: [facility];  Service: Gastroenterology;  Laterality: N/A;  . FINGER FRACTURE SURGERY Left    "shattered my pinky"  . FRACTURE SURGERY    . I & D EXTREMITY Left 09/18/2018   Procedure:  IRRIGATION AND DEBRIDEMENT EXTREMITY;  Surgeon: Leanora Cover, MD;  Location: [facility];  Service: Orthopedics;  Laterality: Left;  . IR PARACENTESIS  07/23/2017  . IR PARACENTESIS  07/2017   "did it twice in the same week" (09/01/2017)  . SHOULDER OPEN ROTATOR CUFF REPAIR Right   . TOOTH EXTRACTION  08/2019  . TUBAL LIGATION    . VAGINAL HYSTERECTOMY      Family Psychiatric History: Reviewed.  Family History:  Family History  Problem Relation Age of Onset  . Lung cancer Mother 37  . Alcohol abuse Mother   . Throat cancer Father 84    Social History:  Social History   Socioeconomic History  . Marital status: Divorced    Spouse name: Not on file  . Number of children: 2  . Years of education: Not on file  . Highest education level: Not on file  Occupational History  . Occupation: applying for disability  Tobacco Use  . Smoking status: Never Smoker  . Smokeless tobacco: Never Used  Substance and Sexual Activity  . Alcohol use: Yes    Alcohol/week: 63.0 standard drinks    Types: 63 Cans of beer per week    Comment: weekly "I have cut back"; 4 days 4 vs 12 pack   . Drug use: Yes    Types: Marijuana    Comment: cocaiane not currently  . Sexual activity: Yes  Other Topics Concern  . Not on file  Social History Narrative   She moved with a boyfriend to [place] and was followed at [facility].  He died of a massive heart attack in 8/18, per her report, and so she moved back to [place] and is living with a friend.      Right handed   2 story home    Lives alone 11/09/19   Social Determinants of Health   Financial Resource Strain:   . Difficulty of Paying Living Expenses:   Food Insecurity:   . Worried About Charity fundraiser in the Last Year:   . Arboriculturist in the Last Year:   Transportation Needs:   . Film/video editor (Medical):   [name] Kitchen Lack of Transportation (Non-Medical):   Physical Activity:   . Days of Exercise per Week:   . Minutes of  Exercise per Session:   Stress:   . Feeling of Stress :   Social Connections:   . Frequency of Communication with Friends and Family:   . Frequency of Social Gatherings with Friends and Family:   . Attends Religious Services:   . Active Member of Clubs or Organizations:   . Attends Archivist Meetings:   [name] Kitchen Marital Status:     Allergies:  Allergies  Allergen Reactions  . Other     Platelets: Rx chest pain, tremors, body aches    Metabolic Disorder Labs: Lab Results  Component Value Date   HGBA1C 5.1 12/15/2019   MPG 100 12/15/2019   No results  found for: PROLACTIN Lab Results  Component Value Date   CHOL 213 (H) 12/13/2019   TRIG 85 12/13/2019   HDL 108 12/13/2019   CHOLHDL 2.0 12/13/2019   LDLCALC 90 12/13/2019   LDLCALC 89 05/05/2019   Lab Results  Component Value Date   TSH 0.304 (L) 12/16/2019   TSH 0.618 12/13/2019    Therapeutic Level Labs: No results found for: LITHIUM No results found for: VALPROATE No components found for:  CBMZ  Current Medications: Current Outpatient Medications  Medication Sig Dispense Refill  . alum & mag hydroxide-simeth (MAALOX/MYLANTA) 200-200-20 MG/5ML suspension Take 15 mLs by mouth every 4 (four) hours as needed for indigestion or heartburn. 355 mL 0  . ARIPiprazole (ABILIFY) 5 MG tablet TAKE 1 TABLET BY MOUTH ONCE A DAY (Patient taking differently: Take 5 mg by mouth daily. ) 30 tablet 1  . clonazePAM (KLONOPIN) 1 MG tablet Take 1 tablet (1 mg total) by mouth 2 (two) times daily as needed for anxiety. 60 tablet 1  . FLUoxetine (PROZAC) 40 MG capsule Take 1 capsule (40 mg total) by mouth daily. 30 capsule 3  . folic acid (FOLVITE) 1 MG tablet Take 1 tablet (1 mg total) by mouth daily. 30 tablet 3  . furosemide (LASIX) 40 MG tablet Take 1 tablet (40 mg total) by mouth daily. 30 tablet 7  . gabapentin (NEURONTIN) 300 MG capsule Take 2 capsules 2 times a day. (Patient taking differently: Take 600 mg by mouth 2 (two)  times daily. ) 120 capsule 3  . hydrOXYzine (ATARAX/VISTARIL) 25 MG tablet Take 1 tablet (25 mg total) by mouth every 6 (six) hours as needed for itching. 90 tablet 1  . lactulose (CHRONULAC) 10 GM/15ML solution Take 30 mLs (20 g total) by mouth 3 (three) times daily. Goal to have 2-3 BMs daily (Patient taking differently: Take 20 g by mouth 3 (three) times daily as needed for moderate constipation. Goal to have 2-3 BMs daily) 240 mL 3  . levETIRAcetam (KEPPRA) 500 MG tablet Take 1 tablet (500 mg total) by mouth 2 (two) times daily. 180 tablet 3  . mirtazapine (REMERON) 30 MG tablet Take 1 tablet (30 mg total) by mouth at bedtime. 30 tablet 1  . Multiple Vitamin (MULTIVITAMIN WITH MINERALS) TABS tablet Take 1 tablet by mouth daily.    . pantoprazole (PROTONIX) 40 MG tablet Take 1 tablet (40 mg total) by mouth daily before breakfast. 30 tablet 3  . polyethylene glycol (MIRALAX / GLYCOLAX) 17 g packet Take 17 g by mouth 2 (two) times daily. (Patient taking differently: Take 17 g by mouth daily as needed for mild constipation. ) 14 each 0  . Sofosbuvir-Velpatasvir (EPCLUSA) 400-100 MG TABS Take 1 tablet by mouth daily. Take 1 tablet by mouth daily. 28 tablet 5  . thiamine 100 MG tablet Take 1 tablet (100 mg total) by mouth daily. 30 tablet 3   No current facility-administered medications for this visit.    Psychiatric Specialty Exam: Review of Systems  Psychiatric/Behavioral: The patient is nervous/anxious.   All other systems reviewed and are negative.   There were no vitals taken for this visit.There is no height or weight on file to calculate BMI.  General Appearance: NA  Eye Contact:  NA  Speech:  Clear and Coherent and Normal Rate  Volume:  Normal  Mood:  Anxious  Affect:  NA  Thought Process:  Goal Directed and Linear  Orientation:  Full (Time, Place, and Person)  Thought Content: Logical  Suicidal Thoughts:  No  Homicidal Thoughts:  No  Memory:  Immediate;   Good Recent;    Good Remote;   Good  Judgement:  Fair  Insight:  Fair  Psychomotor Activity:  NA  Concentration:  Concentration: Good  Recall:  Good  Fund of Knowledge: Good  Language: Good  Akathisia:  Negative  Handed:  Right  AIMS (if indicated): not done  Assets:  Communication Skills Desire for Improvement Housing Resilience  ADL's:  Intact  Cognition: WNL  Sleep:  Fair   Screenings: AIMS     Admission (Discharged) from 08/25/2018 in [facility] Admission (Discharged) from 10/28/2017 in [facility]  AIMS Total Score  0  0    AUDIT     Admission (Discharged) from 08/25/2018 in [facility] Admission (Discharged) from 10/28/2017 in [facility]  Alcohol Use Disorder Identification Test Final Score (AUDIT)  40  32    PHQ2-9     Office Visit from 05/05/2019 in [facility] Office Visit from 04/22/2019 in [facility] Office Visit from 02/19/2019 in [facility] Office Visit from 09/29/2018 in [facility] Office Visit from 12/16/2017 in [facility]  PHQ-2 Total Score  0  0  0  0  1       Assessment and Plan: 49 yo divorced female with a long hx of alcohol use disorder, polysubstance abuse (cocaine, heroin), and past diagnoses of major depressive disorder vs substance-induced mood disorder vs bipolar disorder. Diagnosis of schizophrenia is also mentioned in the record but patient is unable to provide any clear history of psychotic episodes unrelated to her drug/alcohol use. She was first treated for mental problems at age 71 when she became depressed and OD on meds. She has been taking fluoxetine (up to 60 mg) for over 20 years per her report. She also tried buspirone, lorazepam and hydroxyzine for anxiety with no clear benefit. Recently  prescribed trazodone for insomnia does not appear to work either. She has never been on any mood stabilizers except for lithium or antipsychotics apart from haloperidol. She did not like how she felt on either of those. She started abusing alcohol at age of 26. At one time she was drinking a 1/2 gallon of vodka plus some beer on a daily basis. She has a hx of alcohol withdrawal delirium. She has some periods of sobriety lasting few months at a time but cannot provide more details. She has alcohol liver cirrhosis, thrombocytopenia and hepatitis C. She minimizes use of illicit drugs claiming that she only used them when "partying".We have added mirtazapine and she reports that sleep has improved. She continues to complain primarily of severe anxiety both generalized and panic type. She has no support, struggles financially. Briana Sweeney reports that she has not been drinking alcohol sinceearly September2020 however two weeks ago she went to a Charity fundraiser" and got drunk (BAL 210). It is unclear how truthful she is in her reports about alcohol use. She no longer is taking lactulose or topiramate. She has not been taking buspirone but reports that clonazepam prescribed recently is very helpful for anxiety and helps her stay away from alcohol?   [id number] anxiety disorder;Mood disorder unspecified (Drug-induced vs bipolar disorder unspecified); Alcohol use disorder severe in early remission; Polysubstance use disorder  Plan: We will continue fluoxetine  40 mg daily,Abilify 5 mg, Remeron 30 mg and clonazepam 1 mg prn anxiety (bid).Next appointment in 9 weeks. The plan was discussed with patient who had an opportunity to ask questions and these were all answered. I spend66minutes in phone consultationwith the patient.    Stephanie Acre, MD 12/30/2019, 9:15 AM

## 2020-01-04 ENCOUNTER — Ambulatory Visit (INDEPENDENT_AMBULATORY_CARE_PROVIDER_SITE_OTHER): Payer: Medicaid Other | Admitting: Family Medicine

## 2020-01-04 ENCOUNTER — Other Ambulatory Visit: Payer: Self-pay

## 2020-01-04 ENCOUNTER — Other Ambulatory Visit: Payer: Self-pay | Admitting: Family Medicine

## 2020-01-04 ENCOUNTER — Encounter: Payer: Self-pay | Admitting: Family Medicine

## 2020-01-04 VITALS — BP 102/60 | HR 98 | Temp 98.4°F | Ht 60.0 in | Wt 143.6 lb

## 2020-01-04 DIAGNOSIS — M79671 Pain in right foot: Secondary | ICD-10-CM

## 2020-01-04 DIAGNOSIS — M21621 Bunionette of right foot: Secondary | ICD-10-CM

## 2020-01-04 DIAGNOSIS — D696 Thrombocytopenia, unspecified: Secondary | ICD-10-CM

## 2020-01-04 DIAGNOSIS — Z09 Encounter for follow-up examination after completed treatment for conditions other than malignant neoplasm: Secondary | ICD-10-CM

## 2020-01-04 DIAGNOSIS — F419 Anxiety disorder, unspecified: Secondary | ICD-10-CM

## 2020-01-04 DIAGNOSIS — B182 Chronic viral hepatitis C: Secondary | ICD-10-CM

## 2020-01-04 DIAGNOSIS — F1011 Alcohol abuse, in remission: Secondary | ICD-10-CM | POA: Diagnosis not present

## 2020-01-04 DIAGNOSIS — Z9889 Other specified postprocedural states: Secondary | ICD-10-CM

## 2020-01-04 MED ORDER — GABAPENTIN 300 MG PO CAPS: ORAL_CAPSULE | ORAL | 3 refills | Status: DC

## 2020-01-04 MED ORDER — HYDROCODONE-ACETAMINOPHEN 10-325 MG PO TABS: 1.0000 | ORAL_TABLET | Freq: Three times a day (TID) | ORAL | 0 refills | Status: DC | PRN

## 2020-01-04 MED FILL — HYDROCODON-APAP 10-325: 10-325 | 10 days supply | Qty: 30 | Fill #0

## 2020-01-04 NOTE — Progress Notes (Signed)
Patient [facility] Follow Up  Subjective:  Patient ID: Briana Sweeney, female    DOB: April 22, 1971  Age: 49 y.o. MRN: [id number]  CC:  Chief Complaint  Patient presents with  . Hospitalization Follow-up    Discharged:  12/20/2019 Acute on chronic thrombocytopenia   . Foot Pain    left & right foot pain hurts to walk    HPI Briana Sweeney is a 49 year old female who presents for Follow Up today.   Past Medical History:  Diagnosis Date  . Alcohol use   . Anxiety   . Bipolar affective disorder ([place])    With anxiety features  . Bunionette of right foot 11/2019  . 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 Depression   . 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 ETOHism ([place])   . GERD (gastroesophageal reflux disease)   . Hematemesis 02/10/2018  . Hepatitis C 2018   hepatitis c and alcohol related hepatitis  . Heroin abuse ([place])   . History of blood transfusion    "blood doesn't clot; I fell down and had to have a transfusion"  . History of kidney stones   . History of thrombocytopenia   . Menopause 2016  . Migraine    "when I get really stressed" (09/01/2017)  . S/P foot surgery, right 11/2019  . Schizophrenia ([place])   . Seizures ([place])    "when I run out of my RX; lots recently" (09/01/2017)  . Thrombocytopenia ([place])    Current Status: Since her last office visit, she has had an ED visit for Alcohol Intoxication on 12/20/2019 and 12/15/2019 for Thrombocytopenia . Today, she  is doing well with no complaints. She has not drank any alcohol since last hospital visit on 12/20/2019. She continues to follow up with Psychiatrist as needed. Her most recent platelet count was at 24 on 4/122021. She has bruising has multiple bruises over her today. She continues to deny petechiae, gingival bleeding, epistaxis, profuse bleeding from superficial cuts, melena, menorrhagia, mentrorrhagia, hematochezia, and  hematuria. She states that her appetite is good. Denies abdominal pain, nausea, vomiting, diarrhea, and constipation. She has increasing right foot pain r/t recent surgery. Her anxiety is stable today. She denies suicidal ideations, homicidal ideations, or auditory hallucinations. She denies fevers, chills, fatigue, recent infections, weight loss, and night sweats. She has not had any headaches, visual changes, dizziness, and falls. No chest pain, heart palpitations, cough and shortness of breath reported. Denies GI problems such as nausea, vomiting, diarrhea, and constipation. She has no reports of blood in stools, dysuria and hematuria. She is taking all medications as prescribed.   Past Surgical History:  Procedure Laterality Date  . ESOPHAGOGASTRODUODENOSCOPY N/A 09/03/2017   Procedure: ESOPHAGOGASTRODUODENOSCOPY (EGD);  Surgeon: Doran Stabler, MD;  Location: [facility];  Service: Gastroenterology;  Laterality: N/A;  . FINGER FRACTURE SURGERY Left    "shattered my pinky"  . FRACTURE SURGERY    . I & D EXTREMITY Left 09/18/2018   Procedure: IRRIGATION AND DEBRIDEMENT EXTREMITY;  Surgeon: Leanora Cover, MD;  Location: [facility];  Service: Orthopedics;  Laterality: Left;  . IR PARACENTESIS  07/23/2017  . IR PARACENTESIS  07/2017   "did it twice in the same week" (09/01/2017)  . SHOULDER OPEN ROTATOR CUFF REPAIR Right   . TOOTH EXTRACTION  08/2019  . TUBAL LIGATION    . VAGINAL HYSTERECTOMY  Family History  Problem Relation Age of Onset  . Lung cancer Mother 86  . Alcohol abuse Mother   . Throat cancer Father 18    Social History   Socioeconomic History  . Marital status: Divorced    Spouse name: Not on file  . Number of children: 2  . Years of education: Not on file  . Highest education level: Not on file  Occupational History  . Occupation: applying for disability  Tobacco Use  . Smoking status: Never Smoker  . Smokeless tobacco: Never Used  Substance and Sexual  Activity  . Alcohol use: Yes    Alcohol/week: 63.0 standard drinks    Types: 63 Cans of beer per week    Comment: weekly "I have cut back"; 4 days 4 vs 12 pack   . Drug use: Yes    Types: Marijuana    Comment: cocaiane not currently  . Sexual activity: Yes  Other Topics Concern  . Not on file  Social History Narrative   She moved with a boyfriend to [place] and was followed at [facility].  He died of a massive heart attack in 8/18, per her report, and so she moved back to [place] and is living with a friend.      Right handed   2 story home    Lives alone 11/09/19   Social Determinants of Health   Financial Resource Strain:   . Difficulty of Paying Living Expenses:   Food Insecurity:   . Worried About Charity fundraiser in the Last Year:   . Arboriculturist in the Last Year:   Transportation Needs:   . Film/video editor (Medical):   [name] Kitchen Lack of Transportation (Non-Medical):   Physical Activity:   . Days of Exercise per Week:   . Minutes of Exercise per Session:   Stress:   . Feeling of Stress :   Social Connections:   . Frequency of Communication with Friends and Family:   . Frequency of Social Gatherings with Friends and Family:   . Attends Religious Services:   . Active Member of Clubs or Organizations:   . Attends Archivist Meetings:   [name] Kitchen Marital Status:   Intimate Partner Violence:   . Fear of Current or Ex-Partner:   . Emotionally Abused:   [name] Kitchen Physically Abused:   . Sexually Abused:     Outpatient Medications Prior to Visit  Medication Sig Dispense Refill  . [START ON 01/07/2020] ARIPiprazole (ABILIFY) 5 MG tablet Take 1 tablet (5 mg total) by mouth daily. 30 tablet 1  . [START ON 01/14/2020] clonazePAM (KLONOPIN) 1 MG tablet Take 1 tablet (1 mg total) by mouth 2 (two) times daily as needed for anxiety. 60 tablet 1  . FLUoxetine (PROZAC) 40 MG capsule Take 1 capsule (40 mg total) by mouth daily. 30 capsule 3  . furosemide (LASIX) 40 MG  tablet Take 1 tablet (40 mg total) by mouth daily. 30 tablet 7  . hydrOXYzine (ATARAX/VISTARIL) 25 MG tablet Take 1 tablet (25 mg total) by mouth every 6 (six) hours as needed for itching. 90 tablet 1  . lactulose (CHRONULAC) 10 GM/15ML solution Take 30 mLs (20 g total) by mouth 3 (three) times daily. Goal to have 2-3 BMs daily (Patient taking differently: Take 20 g by mouth 3 (three) times daily as needed for moderate constipation. Goal to have 2-3 BMs daily) 240 mL 3  . levETIRAcetam (KEPPRA) 500 MG tablet Take 1 tablet (  500 mg total) by mouth 2 (two) times daily. 180 tablet 3  . [START ON 01/07/2020] mirtazapine (REMERON) 30 MG tablet Take 1 tablet (30 mg total) by mouth at bedtime. 30 tablet 1  . Multiple Vitamin (MULTIVITAMIN WITH MINERALS) TABS tablet Take 1 tablet by mouth daily.    . polyethylene glycol (MIRALAX / GLYCOLAX) 17 g packet Take 17 g by mouth 2 (two) times daily. (Patient taking differently: Take 17 g by mouth daily as needed for mild constipation. ) 14 each 0  . Sofosbuvir-Velpatasvir (EPCLUSA) 400-100 MG TABS Take 1 tablet by mouth daily. Take 1 tablet by mouth daily. 28 tablet 5  . gabapentin (NEURONTIN) 300 MG capsule Take 2 capsules 2 times a day. (Patient taking differently: Take 600 mg by mouth 2 (two) times daily. ) [id number] capsule 3  . folic acid (FOLVITE) 1 MG tablet Take 1 tablet (1 mg total) by mouth daily. (Patient not taking: Reported on 01/04/2020) 30 tablet 3  . pantoprazole (PROTONIX) 40 MG tablet Take 1 tablet (40 mg total) by mouth daily before breakfast. (Patient not taking: Reported on 01/04/2020) 30 tablet 3  . thiamine 100 MG tablet Take 1 tablet (100 mg total) by mouth daily. (Patient not taking: Reported on 01/04/2020) 30 tablet 3  . alum & mag hydroxide-simeth (MAALOX/MYLANTA) 200-200-20 MG/5ML suspension Take 15 mLs by mouth every 4 (four) hours as needed for indigestion or heartburn. 355 mL 0   No facility-administered medications prior to visit.    Allergies    Allergen Reactions  . Other     Platelets: Rx chest pain, tremors, body aches    ROS Review of Systems  Constitutional: Negative.   HENT: Negative.   Eyes: Negative.   Respiratory: Negative.   Cardiovascular: Negative.   Gastrointestinal: Negative.   Endocrine: Negative.   Genitourinary: Negative.   Musculoskeletal: Negative.   Skin: Negative.   Allergic/Immunologic: Negative.   Neurological: Positive for dizziness (occasional ) and headaches (occasional ).  Hematological: Negative.   Psychiatric/Behavioral: The patient is nervous/anxious.       Objective:    Physical Exam  Constitutional: She is oriented to person, place, and time. She appears well-developed and well-nourished.  HENT:  Head: Normocephalic and atraumatic.  Eyes: Conjunctivae are normal.  Cardiovascular: Normal rate, regular rhythm, normal heart sounds and intact distal pulses.  Pulmonary/Chest: Effort normal and breath sounds normal.  Abdominal: Soft. Bowel sounds are normal.  Musculoskeletal:        General: Normal range of motion.     Cervical back: Normal range of motion and neck supple.  Neurological: She is alert and oriented to person, place, and time. She has normal reflexes.  Skin: Skin is warm.  Psychiatric: She has a normal mood and affect. Her behavior is normal. Judgment and thought content normal.  Nursing note and vitals reviewed.   BP 102/60   Pulse 98   Temp 98.4 F (36.9 C)   Ht 5' (1.524 m)   Wt 143 lb 9.6 oz (65.1 kg)   SpO2 96%   BMI 28.04 kg/m  Wt Readings from Last 3 Encounters:  01/04/20 143 lb 9.6 oz (65.1 kg)  12/15/19 145 lb 3.2 oz (65.9 kg)  12/13/19 144 lb (65.3 kg)     There are no preventive care reminders to display for this patient.  There are no preventive care reminders to display for this patient.  Lab Results  Component Value Date   TSH 0.304 (L) 12/16/2019   Lab Results  Component Value Date   WBC 4.7 12/20/2019   HGB 12.9 12/20/2019   HCT  39.4 12/20/2019   MCV 94.5 12/20/2019   PLT 24 (LL) 12/20/2019   Lab Results  Component Value Date   NA 142 12/20/2019   K 3.4 (L) 12/20/2019   CO2 19 (L) 12/20/2019   GLUCOSE 88 12/20/2019   BUN 11 12/20/2019   CREATININE 0.58 12/20/2019   BILITOT 0.8 12/20/2019   ALKPHOS 134 (H) 12/20/2019   AST 36 12/20/2019   ALT 34 12/20/2019   PROT 9.5 (H) 12/20/2019   ALBUMIN 3.2 (L) 12/20/2019   CALCIUM 8.8 (L) 12/20/2019   ANIONGAP 10 12/20/2019   Lab Results  Component Value Date   CHOL 213 (H) 12/13/2019   Lab Results  Component Value Date   HDL 108 12/13/2019   Lab Results  Component Value Date   LDLCALC 90 12/13/2019   Lab Results  Component Value Date   TRIG 85 12/13/2019   Lab Results  Component Value Date   CHOLHDL 2.0 12/13/2019   Lab Results  Component Value Date   HGBA1C 5.1 12/15/2019      Assessment & Plan:    1. Hospital discharge follow-up  2. History of alcohol abuse  3. Thrombocytopenia ([place]) Platelet Count at 24 about 2 weeks ago. We will re-evaluate today.No signs or symptoms of distress noted today.  - CBC with Differential - Comprehensive metabolic panel; Future - Comprehensive metabolic panel  4. S/P foot surgery, right - gabapentin (NEURONTIN) 300 MG capsule; Take 2 capsules (600 mg), by mouth, 3 times a day.  Dispense: 180 capsule; Refill: 3 - HYDROcodone-acetaminophen (NORCO) 10-325 MG tablet; Take 1 tablet by mouth every 8 (eight) hours as needed.  Dispense: 30 tablet; Refill: 0  5. Bunionette of right foot Increased pain.  - gabapentin (NEURONTIN) 300 MG capsule; Take 2 capsules (600 mg), by mouth, 3 times a day.  Dispense: 180 capsule; Refill: 3 - HYDROcodone-acetaminophen (NORCO) 10-325 MG tablet; Take 1 tablet by mouth every 8 (eight) hours as needed.  Dispense: 30 tablet; Refill: 0  6. Right foot pain We will increase Gabapentin dosage today.  - gabapentin (NEURONTIN) 300 MG capsule; Take 2 capsules (600 mg), by mouth, 3  times a day.  Dispense: 180 capsule; Refill: 3 - HYDROcodone-acetaminophen (NORCO) 10-325 MG tablet; Take 1 tablet by mouth every 8 (eight) hours as needed.  Dispense: 30 tablet; Refill: 0  7. Anxiety  8. Chronic hepatitis C without hepatic coma ([place]) She will continue anti-viral medications and follow ups with Infection Disease as needed.   9. Follow up She will follow up in 3 months.   Meds ordered this encounter  Medications  . gabapentin (NEURONTIN) 300 MG capsule    Sig: Take 2 capsules (600 mg), by mouth, 3 times a day.    Dispense:  180 capsule    Refill:  3  . HYDROcodone-acetaminophen (NORCO) 10-325 MG tablet    Sig: Take 1 tablet by mouth every 8 (eight) hours as needed.    Dispense:  30 tablet    Refill:  0    Orders Placed This Encounter  Procedures  . CBC with Differential  . Comprehensive metabolic panel    Referral Orders  No referral(s) requested today    Kathe Becton,  MSN, FNP-BC [facility] [address] [place], [place] [address] [phone] fax  Problem List Items Addressed This Visit  Digestive   Hepatitis C     Other   History of alcohol abuse   Thrombocytopenia ([place])   Relevant Orders   CBC with Differential   Comprehensive metabolic panel    Other Visit Diagnoses    Hospital discharge follow-up    -  Primary   S/P foot surgery, right       Relevant Medications   gabapentin (NEURONTIN) 300 MG capsule   HYDROcodone-acetaminophen (NORCO) 10-325 MG tablet   Bunionette of right foot       Relevant Medications   gabapentin (NEURONTIN) 300 MG capsule   HYDROcodone-acetaminophen (NORCO) 10-325 MG tablet   Right foot pain       Relevant Medications   gabapentin (NEURONTIN) 300 MG capsule   HYDROcodone-acetaminophen (NORCO) 10-325 MG tablet   Anxiety       Follow up          Meds ordered this encounter  Medications  . gabapentin (NEURONTIN)  300 MG capsule    Sig: Take 2 capsules (600 mg), by mouth, 3 times a day.    Dispense:  180 capsule    Refill:  3  . HYDROcodone-acetaminophen (NORCO) 10-325 MG tablet    Sig: Take 1 tablet by mouth every 8 (eight) hours as needed.    Dispense:  30 tablet    Refill:  0    Follow-up: Return in about 3 months (around 04/04/2020).    [name], FNP

## 2020-01-05 LAB — COMPREHENSIVE METABOLIC PANEL
ALT: 24 IU/L (ref 0–32)
AST: 48 IU/L — ABNORMAL HIGH (ref 0–40)
Albumin/Globulin Ratio: 0.9 — ABNORMAL LOW (ref 1.2–2.2)
Albumin: 3.9 g/dL (ref 3.8–4.8)
Alkaline Phosphatase: 207 IU/L — ABNORMAL HIGH (ref 39–117)
BUN/Creatinine Ratio: 8 — ABNORMAL LOW (ref 9–23)
BUN: 6 mg/dL (ref 6–24)
Bilirubin Total: 0.9 mg/dL (ref 0.0–1.2)
CO2: 18 mmol/L — ABNORMAL LOW (ref 20–29)
Calcium: 8.8 mg/dL (ref 8.7–10.2)
Chloride: 105 mmol/L (ref 96–106)
Creatinine, Ser: 0.76 mg/dL (ref 0.57–1.00)
GFR calc Af Amer: 108 mL/min/{1.73_m2} (ref >59)
GFR calc non Af Amer: 94 mL/min/{1.73_m2} (ref >59)
Globulin, Total: 4.4 g/dL (ref 1.5–4.5)
Glucose: 92 mg/dL (ref 65–99)
Potassium: 5 mmol/L (ref 3.5–5.2)
Sodium: 142 mmol/L (ref 134–144)
Total Protein: 8.3 g/dL (ref 6.0–8.5)

## 2020-01-05 LAB — CBC WITH DIFFERENTIAL/PLATELET
Basophils Absolute: 0 10*3/uL (ref 0.0–0.2)
Basos: 0 %
EOS (ABSOLUTE): 0.1 10*3/uL (ref 0.0–0.4)
Eos: 3 %
Hematocrit: 43.4 % (ref 34.0–46.6)
Hemoglobin: 14.4 g/dL (ref 11.1–15.9)
Immature Grans (Abs): 0 10*3/uL (ref 0.0–0.1)
Immature Granulocytes: 0 %
Lymphocytes Absolute: 0.9 10*3/uL (ref 0.7–3.1)
Lymphs: 33 %
MCH: 30.1 pg (ref 26.6–33.0)
MCHC: 33.2 g/dL (ref 31.5–35.7)
MCV: 91 fL (ref 79–97)
Monocytes Absolute: 0.3 10*3/uL (ref 0.1–0.9)
Monocytes: 12 %
Neutrophils Absolute: 1.5 10*3/uL (ref 1.4–7.0)
Neutrophils: 52 %
Platelets: 60 10*3/uL — CL (ref 150–450)
RBC: 4.78 x10E6/uL (ref 3.77–5.28)
RDW: 16 % — ABNORMAL HIGH (ref 11.7–15.4)
WBC: 2.8 10*3/uL — ABNORMAL LOW (ref 3.4–10.8)

## 2020-01-05 MED FILL — FLUoxetine HCL 40 MG CAPS: 40 | 30 days supply | Qty: 30 | Fill #2

## 2020-01-06 ENCOUNTER — Other Ambulatory Visit: Payer: Self-pay

## 2020-01-06 ENCOUNTER — Inpatient Hospital Stay: Payer: Medicaid Other

## 2020-01-06 ENCOUNTER — Encounter: Payer: Self-pay | Admitting: Hematology & Oncology

## 2020-01-06 ENCOUNTER — Inpatient Hospital Stay: Payer: Medicaid Other | Attending: Hematology & Oncology | Admitting: Hematology & Oncology

## 2020-01-06 VITALS — BP 110/72 | HR 74 | Temp 97.5°F | Resp 20 | Ht 59.0 in | Wt 144.1 lb

## 2020-01-06 DIAGNOSIS — Z79899 Other long term (current) drug therapy: Secondary | ICD-10-CM | POA: Insufficient documentation

## 2020-01-06 DIAGNOSIS — D693 Immune thrombocytopenic purpura: Secondary | ICD-10-CM | POA: Diagnosis not present

## 2020-01-06 DIAGNOSIS — K7290 Hepatic failure, unspecified without coma: Secondary | ICD-10-CM

## 2020-01-06 DIAGNOSIS — K7460 Unspecified cirrhosis of liver: Secondary | ICD-10-CM

## 2020-01-06 DIAGNOSIS — D61818 Other pancytopenia: Secondary | ICD-10-CM | POA: Insufficient documentation

## 2020-01-06 DIAGNOSIS — R768 Other specified abnormal immunological findings in serum: Secondary | ICD-10-CM | POA: Diagnosis not present

## 2020-01-06 DIAGNOSIS — F10188 Alcohol abuse with other alcohol-induced disorder: Secondary | ICD-10-CM | POA: Insufficient documentation

## 2020-01-06 LAB — SAVE SMEAR(SSMR), FOR PROVIDER SLIDE REVIEW

## 2020-01-06 LAB — CMP (CANCER CENTER ONLY)
ALT: 19 U/L (ref 0–44)
AST: 31 U/L (ref 15–41)
Albumin: 3.7 g/dL (ref 3.5–5.0)
Alkaline Phosphatase: 177 U/L — ABNORMAL HIGH (ref 38–126)
Anion gap: 8 (ref 5–15)
BUN: 8 mg/dL (ref 6–20)
CO2: 25 mmol/L (ref 22–32)
Calcium: 9.4 mg/dL (ref 8.9–10.3)
Chloride: 103 mmol/L (ref 98–111)
Creatinine: 0.68 mg/dL (ref 0.44–1.00)
GFR, Est AFR Am: >60 mL/min (ref >60)
GFR, Estimated: >60 mL/min (ref >60)
Glucose, Bld: 97 mg/dL (ref 70–99)
Potassium: 4 mmol/L (ref 3.5–5.1)
Sodium: 136 mmol/L (ref 135–145)
Total Bilirubin: 1.6 mg/dL — ABNORMAL HIGH (ref 0.3–1.2)
Total Protein: 8 g/dL (ref 6.5–8.1)

## 2020-01-06 LAB — CBC WITH DIFFERENTIAL (CANCER CENTER ONLY)
Abs Immature Granulocytes: 0 10*3/uL (ref 0.00–0.07)
Basophils Absolute: 0 10*3/uL (ref 0.0–0.1)
Basophils Relative: 0 %
Eosinophils Absolute: 0.1 10*3/uL (ref 0.0–0.5)
Eosinophils Relative: 3 %
HCT: 41.9 % (ref 36.0–46.0)
Hemoglobin: 13.6 g/dL (ref 12.0–15.0)
Immature Granulocytes: 0 %
Lymphocytes Relative: 25 %
Lymphs Abs: 0.4 10*3/uL — ABNORMAL LOW (ref 0.7–4.0)
MCH: 30 pg (ref 26.0–34.0)
MCHC: 32.5 g/dL (ref 30.0–36.0)
MCV: 92.3 fL (ref 80.0–100.0)
Monocytes Absolute: 0.2 10*3/uL (ref 0.1–1.0)
Monocytes Relative: 13 %
Neutro Abs: 1 10*3/uL — ABNORMAL LOW (ref 1.7–7.7)
Neutrophils Relative %: 59 %
Platelet Count: 32 10*3/uL — ABNORMAL LOW (ref 150–400)
RBC: 4.54 MIL/uL (ref 3.87–5.11)
RDW: 16.2 % — ABNORMAL HIGH (ref 11.5–15.5)
WBC Count: 1.7 10*3/uL — ABNORMAL LOW (ref 4.0–10.5)
nRBC: 0 % (ref 0.0–0.2)

## 2020-01-06 LAB — PLATELET BY CITRATE

## 2020-01-06 LAB — AMMONIA: Ammonia: 40 umol/L — ABNORMAL HIGH (ref 9–35)

## 2020-01-06 MED FILL — GABAPENTIN 300 MG CAPSULE: 300 | 30 days supply | Qty: 180 | Fill #0

## 2020-01-07 ENCOUNTER — Telehealth: Payer: Self-pay

## 2020-01-07 ENCOUNTER — Telehealth: Payer: Self-pay | Admitting: Family Medicine

## 2020-01-07 LAB — HCV RNA QUANT RFLX ULTRA OR GENOTYP
HCV RNA Qnt(log copy/mL): UNDETERMINED log10 IU/mL
HepC Qn: NOT DETECTED IU/mL

## 2020-01-07 NOTE — Telephone Encounter (Signed)
Briana Sweeney called: Her platelets were in the 30's. She will getting a shot on Monday.

## 2020-01-07 NOTE — Telephone Encounter (Signed)
rob

## 2020-01-07 NOTE — Progress Notes (Signed)
Hematology and Oncology Follow Up Visit  Cuma Polyakov [id number] [birth year] 49 y.o. 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 Gebbia is back after being seen in the hospital.  I have seen her many times in the hospital.  She typically comes into the hospital for alcohol induced pancytopenia.  She has a history of heavy alcohol use.  She still drinks.  She does not go to rehab.  Her latest hospital admission was secondary to bleeding.  Her platelet count I think was less than 30,000.  She has had a bone marrow biopsy done in the past.  Last time she had a bone marrow biopsy done was 2 years ago in April 2019.  This showed some decreased megakaryocytes and decreased white blood cell precursors.  Again I am sure this is all from alcohol toxicity.  I am absolutely surprised that she made it to the office.  We have wanted her to come back to the office on many occasions but she never has.  In the hospital, we gave her IVIG.  We also have given her Nplate.  This tends to get her platelet count better.  She is still drinking.  She is trying to decrease her heavy alcohol use and just go with beer.  She has had no bleeding.  There is been no bruising.  Overall, I would have to say her performance status is probably ECOG 2.  Medications:  Current Outpatient Medications:  .  ARIPiprazole (ABILIFY) 5 MG tablet, Take 1 tablet (5 mg total) by mouth daily., Disp: 30 tablet, Rfl: 1 .  [START ON 01/14/2020] clonazePAM (KLONOPIN) 1 MG tablet, Take 1 tablet (1 mg total) by mouth 2 (two) times daily as needed for anxiety., Disp: 60 tablet, Rfl: 1 .  FLUoxetine (PROZAC) 40 MG capsule, Take 1 capsule (40 mg total) by mouth daily., Disp: 30 capsule, Rfl: 3 .  furosemide (LASIX) 40 MG tablet, Take 1 tablet (40 mg total) by  mouth daily., Disp: 30 tablet, Rfl: 7 .  gabapentin (NEURONTIN) 300 MG capsule, Take 2 capsules (600 mg), by mouth, 3 times a day., Disp: 180 capsule, Rfl: 3 .  HYDROcodone-acetaminophen (NORCO) 10-325 MG tablet, Take 1 tablet by mouth every 8 (eight) hours as needed., Disp: 30 tablet, Rfl: 0 .  hydrOXYzine (ATARAX/VISTARIL) 25 MG tablet, Take 1 tablet (25 mg total) by mouth every 6 (six) hours as needed for itching., Disp: 90 tablet, Rfl: 1 .  lactulose (CHRONULAC) 10 GM/15ML solution, Take 30 mLs (20 g total) by mouth 3 (three) times daily. Goal to have 2-3 BMs daily (Patient taking differently: Take 20 g by mouth 3 (three) times daily as needed for moderate constipation. Goal to have 2-3 BMs daily), Disp: 240 mL, Rfl: 3 .  levETIRAcetam (KEPPRA) 500 MG tablet, Take 1 tablet (500 mg total) by mouth 2 (two) times daily., Disp: 180 tablet, Rfl: 3 .  mirtazapine (REMERON) 30 MG tablet, Take 1 tablet (30 mg total) by mouth at bedtime., Disp: 30 tablet, Rfl: 1 .  Multiple Vitamin (MULTIVITAMIN WITH MINERALS) TABS tablet, Take 1 tablet by mouth daily., Disp:  , Rfl:  .  polyethylene glycol (MIRALAX / GLYCOLAX) 17 g packet, Take 17 g by mouth 2 (two) times daily. (Patient taking differently: Take 17 g by mouth daily as needed for  mild constipation. ), Disp: 14 each, Rfl: 0 .  Sofosbuvir-Velpatasvir (EPCLUSA) 400-100 MG TABS, Take 1 tablet by mouth daily. Take 1 tablet by mouth daily., Disp: 28 tablet, Rfl: 5  Allergies:  Allergies  Allergen Reactions  . Other     Platelets: Rx chest pain, tremors, body aches    Past Medical History, Surgical history, Social history, and Family History were reviewed and updated.  Review of Systems: Review of Systems  Constitutional: Negative.   HENT:  Negative.   Eyes: Negative.   Respiratory: Negative.   Cardiovascular: Negative.   Gastrointestinal: Positive for blood in stool, nausea and vomiting.  Endocrine: Negative.   Genitourinary: Negative.     Musculoskeletal: Negative.   Skin: Negative.   Neurological: Negative.   Hematological: Negative.   Psychiatric/Behavioral: Negative.     Physical Exam:  height is '4\' 11"'$  (1.499 m) and weight is 144 lb 1.9 oz (65.4 kg). Her temporal temperature is 97.5 F (36.4 C) (abnormal). Her blood pressure is 110/72 and her pulse is 74. Her respiration is 20 and oxygen saturation is 100%.   Wt Readings from Last 3 Encounters:  01/06/20 144 lb 1.9 oz (65.4 kg)  01/04/20 143 lb 9.6 oz (65.1 kg)  12/15/19 145 lb 3.2 oz (65.9 kg)    Physical Exam Vitals reviewed.  HENT:     Head: Normocephalic and atraumatic.  Eyes:     Pupils: Pupils are equal, round, and reactive to light.  Cardiovascular:     Rate and Rhythm: Normal rate and regular rhythm.     Heart sounds: Normal heart sounds.  Pulmonary:     Effort: Pulmonary effort is normal.     Breath sounds: Normal breath sounds.  Abdominal:     General: Bowel sounds are normal.     Palpations: Abdomen is soft.  Musculoskeletal:        General: No tenderness or deformity. Normal range of motion.     Cervical back: Normal range of motion.  Lymphadenopathy:     Cervical: No cervical adenopathy.  Skin:    General: Skin is warm and dry.     Findings: No erythema or rash.  Neurological:     Mental Status: She is alert and oriented to person, place, and time.  Psychiatric:        Behavior: Behavior normal.        Thought Content: Thought content normal.        Judgment: Judgment normal.      Lab Results  Component Value Date   WBC 1.7 (L) 01/06/2020   HGB 13.6 01/06/2020   HCT 41.9 01/06/2020   MCV 92.3 01/06/2020   PLT 32 (L) 01/06/2020     Chemistry      Component Value Date/Time   NA 136 01/06/2020 1048   NA 142 01/04/2020 1510   K 4.0 01/06/2020 1048   CL 103 01/06/2020 1048   CO2 25 01/06/2020 1048   BUN 8 01/06/2020 1048   BUN 6 01/04/2020 1510   CREATININE 0.68 01/06/2020 1048   CREATININE 0.61 10/25/2019 0913       Component Value Date/Time   CALCIUM 9.4 01/06/2020 1048   ALKPHOS 177 (H) 01/06/2020 1048   AST 31 01/06/2020 1048   ALT 19 01/06/2020 1048   BILITOT 1.6 (H) 01/06/2020 1048      Impression and Plan: Ms. 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  She also has an element of immune based thrombocytopenia.  Her  platelet count is 32,000.  This really should not surprise me.  Her white cell count is also low.  I think that as long she drinks alcohol, she will have an element of pancytopenia.  She will agree to Nplate.  She cannot have it today.  She says she will come in next week for Nplate.  Hopefully, she will come in for Nplate.  I am sure that at some point, she will end up back in the hospital with bleeding.  I just feel bad that she will not get the help that she needs for her drinking.  I know that it is been try to get her to rehab while she has been in the hospital.  This just has not been effective.  We will plan to have her come back to see [place] in a couple weeks.  Maybe we can try to get her in on a schedule if we are given her Nplate.  We could certainly do IVIG but this might be more of a difficulty since I am not sure she can tolerate the volume load for 1 or 2-day IVIG infusions.  When she is in the hospital we typically do 5-day infusions.   Volanda Napoleon, MD 4/30/20212:26 PM

## 2020-01-10 ENCOUNTER — Other Ambulatory Visit: Payer: Self-pay

## 2020-01-10 ENCOUNTER — Inpatient Hospital Stay: Payer: Medicaid Other | Attending: Hematology & Oncology

## 2020-01-10 ENCOUNTER — Telehealth: Payer: Self-pay | Admitting: Hematology & Oncology

## 2020-01-10 VITALS — BP 113/74 | HR 85 | Temp 98.0°F | Resp 18

## 2020-01-10 DIAGNOSIS — D696 Thrombocytopenia, unspecified: Secondary | ICD-10-CM

## 2020-01-10 DIAGNOSIS — D693 Immune thrombocytopenic purpura: Secondary | ICD-10-CM | POA: Insufficient documentation

## 2020-01-10 MED ORDER — ROMIPLOSTIM 250 MCG ~~LOC~~ SOLR
2.0000 ug/kg | Freq: Once | SUBCUTANEOUS | Status: AC
  Administered 2020-01-10: 130 ug via SUBCUTANEOUS
  Filled 2020-01-10: qty 0.26

## 2020-01-10 NOTE — Patient Instructions (Signed)
Romiplostim injection What is this medicine? ROMIPLOSTIM (roe mi PLOE stim) helps your body make more platelets. This medicine is used to treat low platelets caused by chronic idiopathic thrombocytopenic purpura (ITP). This medicine may be used for other purposes; ask your health care provider or pharmacist if you have questions. COMMON BRAND NAME(S): Nplate What should I tell my health care provider before I take this medicine? They need to know if you have any of these conditions:  bleeding disorders  bone marrow problem, like blood cancer or myelodysplastic syndrome  history of blood clots  liver disease  surgery to remove your spleen  an unusual or allergic reaction to romiplostim, mannitol, other medicines, foods, dyes, or preservatives  pregnant or trying to get pregnant  breast-feeding How should I use this medicine? This medicine is for injection under the skin. It is given by a health care professional in a hospital or clinic setting. A special MedGuide will be given to you before your injection. Read this information carefully each time. Talk to your pediatrician regarding the use of this medicine in children. While this drug may be prescribed for children as young as 1 year for selected conditions, precautions do apply. Overdosage: If you think you have taken too much of this medicine contact a poison control center or emergency room at once. NOTE: This medicine is only for you. Do not share this medicine with others. What if I miss a dose? It is important not to miss your dose. Call your doctor or health care professional if you are unable to keep an appointment. What may interact with this medicine? Interactions are not expected. This list may not describe all possible interactions. Give your health care provider a list of all the medicines, herbs, non-prescription drugs, or dietary supplements you use. Also tell them if you smoke, drink alcohol, or use illegal drugs.  Some items may interact with your medicine. What should I watch for while using this medicine? Your condition will be monitored carefully while you are receiving this medicine. Visit your prescriber or health care professional for regular checks on your progress and for the needed blood tests. It is important to keep all appointments. What side effects may I notice from receiving this medicine? Side effects that you should report to your doctor or health care professional as soon as possible:  allergic reactions like skin rash, itching or hives, swelling of the face, lips, or tongue  signs and symptoms of bleeding such as bloody or black, tarry stools; red or dark brown urine; spitting up blood or brown material that looks like coffee grounds; red spots on the skin; unusual bruising or bleeding from the eyes, gums, or nose  signs and symptoms of a blood clot such as chest pain; shortness of breath; pain, swelling, or warmth in the leg  signs and symptoms of a stroke like changes in vision; confusion; trouble speaking or understanding; severe headaches; sudden numbness or weakness of the face, arm or leg; trouble walking; dizziness; loss of balance or coordination Side effects that usually do not require medical attention (report to your doctor or health care professional if they continue or are bothersome):  headache  pain in arms and legs  pain in mouth  stomach pain This list may not describe all possible side effects. Call your doctor for medical advice about side effects. You may report side effects to FDA at 1-800-FDA-1088. Where should I keep my medicine? This drug is given in a hospital or clinic   and will not be stored at home. NOTE: This sheet is a summary. It may not cover all possible information. If you have questions about this medicine, talk to your doctor, pharmacist, or health care provider.  2020 Elsevier/Gold Standard (2017-08-25 11:10:55)  

## 2020-01-10 NOTE — Telephone Encounter (Signed)
I called and spoke with patient regarding appointments for 5/20.  She was instructed to call transportation services on 5/13 to arrange her ride to this appointment.  She voiced understandling of these instructions as well

## 2020-01-10 NOTE — Progress Notes (Signed)
Per Dr. Dicie Beam last office note, patient to get Nplate based on last weeks labs

## 2020-01-14 MED FILL — CLONAZEPAM 1 MG TABS: 1 | 30 days supply | Qty: 60 | Fill #0

## 2020-01-27 ENCOUNTER — Inpatient Hospital Stay (HOSPITAL_BASED_OUTPATIENT_CLINIC_OR_DEPARTMENT_OTHER): Payer: Medicaid Other | Admitting: Family

## 2020-01-27 ENCOUNTER — Telehealth: Payer: Self-pay | Admitting: Family

## 2020-01-27 ENCOUNTER — Inpatient Hospital Stay: Payer: Medicaid Other

## 2020-01-27 ENCOUNTER — Telehealth: Payer: Self-pay

## 2020-01-27 ENCOUNTER — Other Ambulatory Visit: Payer: Self-pay

## 2020-01-27 ENCOUNTER — Ambulatory Visit: Payer: Medicaid Other | Admitting: Family

## 2020-01-27 ENCOUNTER — Encounter: Payer: Self-pay | Admitting: Family

## 2020-01-27 VITALS — BP 118/89 | HR 80 | Temp 98.0°F | Resp 18 | Wt 144.0 lb

## 2020-01-27 DIAGNOSIS — K7460 Unspecified cirrhosis of liver: Secondary | ICD-10-CM

## 2020-01-27 DIAGNOSIS — D696 Thrombocytopenia, unspecified: Secondary | ICD-10-CM

## 2020-01-27 DIAGNOSIS — D61818 Other pancytopenia: Secondary | ICD-10-CM

## 2020-01-27 DIAGNOSIS — K7290 Hepatic failure, unspecified without coma: Secondary | ICD-10-CM

## 2020-01-27 DIAGNOSIS — D693 Immune thrombocytopenic purpura: Secondary | ICD-10-CM | POA: Diagnosis not present

## 2020-01-27 LAB — CMP (CANCER CENTER ONLY)
ALT: 23 U/L (ref 0–44)
AST: 47 U/L — ABNORMAL HIGH (ref 15–41)
Albumin: 3.8 g/dL (ref 3.5–5.0)
Alkaline Phosphatase: 224 U/L — ABNORMAL HIGH (ref 38–126)
Anion gap: 10 (ref 5–15)
BUN: 10 mg/dL (ref 6–20)
CO2: 22 mmol/L (ref 22–32)
Calcium: 9.4 mg/dL (ref 8.9–10.3)
Chloride: 104 mmol/L (ref 98–111)
Creatinine: 0.6 mg/dL (ref 0.44–1.00)
GFR, Est AFR Am: >60 mL/min (ref >60)
GFR, Estimated: >60 mL/min (ref >60)
Glucose, Bld: 102 mg/dL — ABNORMAL HIGH (ref 70–99)
Potassium: 4.1 mmol/L (ref 3.5–5.1)
Sodium: 136 mmol/L (ref 135–145)
Total Bilirubin: 1.1 mg/dL (ref 0.3–1.2)
Total Protein: 7.9 g/dL (ref 6.5–8.1)

## 2020-01-27 LAB — CBC WITH DIFFERENTIAL (CANCER CENTER ONLY)
Abs Immature Granulocytes: 0 10*3/uL (ref 0.00–0.07)
Basophils Absolute: 0 10*3/uL (ref 0.0–0.1)
Basophils Relative: 1 %
Eosinophils Absolute: 0.1 10*3/uL (ref 0.0–0.5)
Eosinophils Relative: 3 %
HCT: 42.8 % (ref 36.0–46.0)
Hemoglobin: 13.9 g/dL (ref 12.0–15.0)
Immature Granulocytes: 0 %
Lymphocytes Relative: 20 %
Lymphs Abs: 0.5 10*3/uL — ABNORMAL LOW (ref 0.7–4.0)
MCH: 29.9 pg (ref 26.0–34.0)
MCHC: 32.5 g/dL (ref 30.0–36.0)
MCV: 92 fL (ref 80.0–100.0)
Monocytes Absolute: 0.4 10*3/uL (ref 0.1–1.0)
Monocytes Relative: 16 %
Neutro Abs: 1.5 10*3/uL — ABNORMAL LOW (ref 1.7–7.7)
Neutrophils Relative %: 60 %
Platelet Count: 52 10*3/uL — ABNORMAL LOW (ref 150–400)
RBC: 4.65 MIL/uL (ref 3.87–5.11)
RDW: 16.1 % — ABNORMAL HIGH (ref 11.5–15.5)
WBC Count: 2.5 10*3/uL — ABNORMAL LOW (ref 4.0–10.5)
nRBC: 0 % (ref 0.0–0.2)

## 2020-01-27 LAB — PLATELET BY CITRATE

## 2020-01-27 LAB — SAVE SMEAR(SSMR), FOR PROVIDER SLIDE REVIEW

## 2020-01-27 MED ORDER — ROMIPLOSTIM 250 MCG ~~LOC~~ SOLR
2.0000 ug/kg | Freq: Once | SUBCUTANEOUS | Status: AC
  Administered 2020-01-27: 130 ug via SUBCUTANEOUS
  Filled 2020-01-27: qty 0.26

## 2020-01-27 NOTE — Patient Instructions (Signed)
Romiplostim injection What is this medicine? ROMIPLOSTIM (roe mi PLOE stim) helps your body make more platelets. This medicine is used to treat low platelets caused by chronic idiopathic thrombocytopenic purpura (ITP). This medicine may be used for other purposes; ask your health care provider or pharmacist if you have questions. COMMON BRAND NAME(S): Nplate What should I tell my health care provider before I take this medicine? They need to know if you have any of these conditions:  bleeding disorders  bone marrow problem, like blood cancer or myelodysplastic syndrome  history of blood clots  liver disease  surgery to remove your spleen  an unusual or allergic reaction to romiplostim, mannitol, other medicines, foods, dyes, or preservatives  pregnant or trying to get pregnant  breast-feeding How should I use this medicine? This medicine is for injection under the skin. It is given by a health care professional in a hospital or clinic setting. A special MedGuide will be given to you before your injection. Read this information carefully each time. Talk to your pediatrician regarding the use of this medicine in children. While this drug may be prescribed for children as young as 1 year for selected conditions, precautions do apply. Overdosage: If you think you have taken too much of this medicine contact a poison control center or emergency room at once. NOTE: This medicine is only for you. Do not share this medicine with others. What if I miss a dose? It is important not to miss your dose. Call your doctor or health care professional if you are unable to keep an appointment. What may interact with this medicine? Interactions are not expected. This list may not describe all possible interactions. Give your health care provider a list of all the medicines, herbs, non-prescription drugs, or dietary supplements you use. Also tell them if you smoke, drink alcohol, or use illegal drugs.  Some items may interact with your medicine. What should I watch for while using this medicine? Your condition will be monitored carefully while you are receiving this medicine. Visit your prescriber or health care professional for regular checks on your progress and for the needed blood tests. It is important to keep all appointments. What side effects may I notice from receiving this medicine? Side effects that you should report to your doctor or health care professional as soon as possible:  allergic reactions like skin rash, itching or hives, swelling of the face, lips, or tongue  signs and symptoms of bleeding such as bloody or black, tarry stools; red or dark brown urine; spitting up blood or brown material that looks like coffee grounds; red spots on the skin; unusual bruising or bleeding from the eyes, gums, or nose  signs and symptoms of a blood clot such as chest pain; shortness of breath; pain, swelling, or warmth in the leg  signs and symptoms of a stroke like changes in vision; confusion; trouble speaking or understanding; severe headaches; sudden numbness or weakness of the face, arm or leg; trouble walking; dizziness; loss of balance or coordination Side effects that usually do not require medical attention (report to your doctor or health care professional if they continue or are bothersome):  headache  pain in arms and legs  pain in mouth  stomach pain This list may not describe all possible side effects. Call your doctor for medical advice about side effects. You may report side effects to FDA at 1-800-FDA-1088. Where should I keep my medicine? This drug is given in a hospital or clinic   and will not be stored at home. NOTE: This sheet is a summary. It may not cover all possible information. If you have questions about this medicine, talk to your doctor, pharmacist, or health care provider.  2020 Elsevier/Gold Standard (2017-08-25 11:10:55)  

## 2020-01-27 NOTE — Telephone Encounter (Signed)
Appointments scheduled calendar printed & mailed per 5/20 los

## 2020-01-27 NOTE — Telephone Encounter (Signed)
COVID-19 Pre-Screening Questions:01/27/20  Do you currently have a fever (>100 F), chills or unexplained body aches? NO   Are you currently experiencing new cough, shortness of breath, sore throat, runny nose?NO .  Have you recently travelled outside the state of [place] in the last 14 days? NO  .  Have you been in contact with someone that is currently pending confirmation of Covid19 testing or has been confirmed to have the [place] virus?  NO  **If the patient answers NO to ALL questions -  advise the patient to please call the clinic before coming to the office should any symptoms develop.

## 2020-01-27 NOTE — Progress Notes (Signed)
Hematology and Oncology Follow Up Visit  Dakoda Bergman [id number] April 01, 1971 49 y.o. 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 Zierke is here today for follow-up and Nplate. She is doing well but does have some occasional fatigue.  Platelet count is improved at 52, Hgb 13.9 and WBC count is 2.5.  She has not noted any blood or petechiae. No excessive or unusual bruising.  No fever, chills, n/v, cough, rash, dizziness, SOB, chest pain, palpitations, abdominal pain or changes in bowel or bladder habits.  She has some bloating and flatus.  No tenderness, numbness or tingling in her extremities at this time.  She has a little swelling in the right great toe where she had a bunionectomy. This has healed nicely and is C/D/I.  She states that she has a good appetite and is staying well hydrated. Her weight is stable.   ECOG Performance Status: 1 - Symptomatic but completely ambulatory  Medications:  Allergies as of 01/27/2020      Reactions   Other    Platelets: Rx chest pain, tremors, body aches      Medication List       Accurate as of Jan 27, 2020 10:38 AM. If you have any questions, ask your nurse or doctor.        ARIPiprazole 5 MG tablet Commonly known as: ABILIFY Take 1 tablet (5 mg total) by mouth daily.   clonazePAM 1 MG tablet Commonly known as: KLONOPIN Take 1 tablet (1 mg total) by mouth 2 (two) times daily as needed for anxiety.   FLUoxetine 40 MG capsule Commonly known as: PROZAC Take 1 capsule (40 mg total) by mouth daily.   furosemide 40 MG tablet Commonly known as: Lasix Take 1 tablet (40 mg total) by mouth daily.   gabapentin 300 MG capsule Commonly known as: NEURONTIN Take 2 capsules (600 mg), by mouth, 3 times a day.   HYDROcodone-acetaminophen 10-325 MG  tablet Commonly known as: NORCO Take 1 tablet by mouth every 8 (eight) hours as needed.   hydrOXYzine 25 MG tablet Commonly known as: ATARAX/VISTARIL Take 1 tablet (25 mg total) by mouth every 6 (six) hours as needed for itching.   lactulose 10 GM/15ML solution Commonly known as: CHRONULAC Take 30 mLs (20 g total) by mouth 3 (three) times daily. Goal to have 2-3 BMs daily What changed:   when to take this  reasons to take this   levETIRAcetam 500 MG tablet Commonly known as: Keppra Take 1 tablet (500 mg total) by mouth 2 (two) times daily.   mirtazapine 30 MG tablet Commonly known as: REMERON Take 1 tablet (30 mg total) by mouth at bedtime.   multivitamin with minerals Tabs tablet Take 1 tablet by mouth daily.   polyethylene glycol 17 g packet Commonly known as: MIRALAX / GLYCOLAX Take 17 g by mouth 2 (two) times daily. What changed:   when to take this  reasons to take this   Sofosbuvir-Velpatasvir 400-100 MG Tabs Commonly known as: Epclusa Take 1 tablet by mouth daily. Take 1 tablet by mouth daily.       Allergies:  Allergies  Allergen Reactions  . Other     Platelets: Rx chest pain, tremors, body aches    Past Medical History, Surgical history, Social history, and Family History were reviewed  and updated.  Review of Systems: All other 10 point review of systems is negative.   Physical Exam:  weight is 144 lb (65.3 kg). Her temporal temperature is 98 F (36.7 C). Her blood pressure is 118/89 and her pulse is 80. Her respiration is 18 and oxygen saturation is 97%.   Wt Readings from Last 3 Encounters:  01/27/20 144 lb (65.3 kg)  01/06/20 144 lb 1.9 oz (65.4 kg)  01/04/20 143 lb 9.6 oz (65.1 kg)    Ocular: Sclerae unicteric, pupils equal, round and reactive to light Ear-nose-throat: Oropharynx clear, dentition fair Lymphatic: No cervical or supraclavicular adenopathy Lungs no rales or rhonchi, good excursion bilaterally Heart regular rate and  rhythm, no murmur appreciated Abd soft, nontender, positive bowel sounds, no liver or spleen tip palpated on exam, no fluid wave  MSK no focal spinal tenderness, no joint edema Neuro: non-focal, well-oriented, appropriate affect Breasts: Deferred   Lab Results  Component Value Date   WBC 2.5 (L) 01/27/2020   HGB 13.9 01/27/2020   HCT 42.8 01/27/2020   MCV 92.0 01/27/2020   PLT 52 (L) 01/27/2020   No results found for: FERRITIN, IRON, TIBC, UIBC, IRONPCTSAT Lab Results  Component Value Date   RBC 4.65 01/27/2020   No results found for: KPAFRELGTCHN, LAMBDASER, KAPLAMBRATIO No results found for: IGGSERUM, IGA, IGMSERUM No results found for: Odetta Pink, SPEI   Chemistry      Component Value Date/Time   NA 136 01/27/2020 0947   NA 142 01/04/2020 1510   K 4.1 01/27/2020 0947   CL 104 01/27/2020 0947   CO2 22 01/27/2020 0947   BUN 10 01/27/2020 0947   BUN 6 01/04/2020 1510   CREATININE 0.60 01/27/2020 0947   CREATININE 0.61 10/25/2019 0913      Component Value Date/Time   CALCIUM 9.4 01/27/2020 0947   ALKPHOS 224 (H) 01/27/2020 0947   AST 47 (H) 01/27/2020 0947   ALT 23 01/27/2020 0947   BILITOT 1.1 01/27/2020 0947       Impression and Plan: Ms. 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  Her platelet count today is 52,000 so we will go ahead and give her Nplate.  We can certainly add IVIG in the future if needed as she has responded nicely to this in the past. So far she seems to be having a nice response to Nplate.  We will plan to se see her back every 2 weeks for lab and injection and follow-up in 4 weeks.  She will contact our office with any questions or concerns. We can certainly see her sooner if needed.   Laverna Peace, NP 5/20/202110:38 AM

## 2020-01-28 ENCOUNTER — Encounter: Payer: Self-pay | Admitting: Family

## 2020-01-28 ENCOUNTER — Telehealth: Payer: Self-pay | Admitting: Family Medicine

## 2020-01-28 ENCOUNTER — Ambulatory Visit (INDEPENDENT_AMBULATORY_CARE_PROVIDER_SITE_OTHER): Payer: Medicaid Other | Admitting: Family

## 2020-01-28 VITALS — BP 111/76 | HR 73 | Temp 97.9°F | Wt 147.0 lb

## 2020-01-28 DIAGNOSIS — B182 Chronic viral hepatitis C: Secondary | ICD-10-CM | POA: Diagnosis not present

## 2020-01-28 NOTE — Progress Notes (Signed)
Subjective:    Patient ID: Briana Sweeney, female    DOB: 31-Mar-1971, 49 y.o.   MRN: [id number]  Chief Complaint  Patient presents with  . Hepatitis C     HPI:  Briana Sweeney is a 49 y.o. 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 Last seen in the office on 10/25/19 with good adherence and tolerance to Epclusa with HCV load that was undetected.  Ms. Gladfelter continues to take her Raeanne Gathers as prescribed with no adverse side effects and 1-2 missed doses since her last office visit. She is feeling fatigued today following infusion with oncology yesterday with Romiplostim to help increase platelets. Denies abdominal pain, nausea, vomiting, diarrhea, scleral icterus or jaundice. She does continue to consume alcohol every few days.    Allergies  Allergen Reactions  . Other     Platelets: Rx chest pain, tremors, body aches      Outpatient Medications Prior to Visit  Medication Sig Dispense Refill  . ARIPiprazole (ABILIFY) 5 MG tablet Take 1 tablet (5 mg total) by mouth daily. 30 tablet 1  . clonazePAM (KLONOPIN) 1 MG tablet Take 1 tablet (1 mg total) by mouth 2 (two) times daily as needed for anxiety. 60 tablet 1  . FLUoxetine (PROZAC) 40 MG capsule Take 1 capsule (40 mg total) by mouth daily. 30 capsule 3  . gabapentin (NEURONTIN) 300 MG capsule Take 2 capsules (600 mg), by mouth, 3 times a day. 180 capsule 3  . lactulose (CHRONULAC) 10 GM/15ML solution Take 30 mLs (20 g total) by mouth 3 (three) times daily. Goal to have 2-3 BMs daily (Patient taking differently: Take 20 g by mouth 3 (three) times daily as needed for moderate constipation. Goal to have 2-3 BMs daily) 240 mL 3  . levETIRAcetam (KEPPRA) 500 MG tablet Take 1 tablet (500 mg total) by mouth 2 (two) times daily. 180 tablet 3  . mirtazapine (REMERON) 30 MG tablet Take 1 tablet (30 mg total) by mouth at bedtime. 30 tablet 1  .  Multiple Vitamin (MULTIVITAMIN WITH MINERALS) TABS tablet Take 1 tablet by mouth daily.    . polyethylene glycol (MIRALAX / GLYCOLAX) 17 g packet Take 17 g by mouth 2 (two) times daily. (Patient taking differently: Take 17 g by mouth daily as needed for mild constipation. ) 14 each 0  . Sofosbuvir-Velpatasvir (EPCLUSA) 400-100 MG TABS Take 1 tablet by mouth daily. Take 1 tablet by mouth daily. 28 tablet 5  . furosemide (LASIX) 40 MG tablet Take 1 tablet (40 mg total) by mouth daily. (Patient not taking: Reported on 01/28/2020) 30 tablet 7  . HYDROcodone-acetaminophen (NORCO) 10-325 MG tablet Take 1 tablet by mouth every 8 (eight) hours as needed. (Patient not taking: Reported on 01/28/2020) 30 tablet 0  . hydrOXYzine (ATARAX/VISTARIL) 25 MG tablet Take 1 tablet (25 mg total) by mouth every 6 (six) hours as needed for itching. (Patient not taking: Reported on 01/28/2020) 90 tablet 1   No facility-administered medications prior to visit.     Past Medical History:  Diagnosis Date  . Alcohol use   . Anxiety   . Bipolar affective disorder ([place])    With anxiety features  . Bunionette of right foot 11/2019  . Cirrhosis of liver ([place])    Due to alcohol and hepatitis C  . Depression   . 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 ETOHism ([place])   .  GERD (gastroesophageal reflux disease)   . Hematemesis 02/10/2018  . Hepatitis C 2018   hepatitis c and alcohol related hepatitis  . Heroin abuse ([place])   . History of blood transfusion    "blood doesn't clot; I fell down and had to have a transfusion"  . History of kidney stones   . History of thrombocytopenia   . Menopause 2016  . Migraine    "when I get really stressed" (09/01/2017)  . S/P foot surgery, right 11/2019  . Schizophrenia ([place])   . Seizures ([place])    "when I run out of my RX; lots recently" (09/01/2017)  . Thrombocytopenia ([place])      Past Surgical History:  Procedure Laterality Date  . ESOPHAGOGASTRODUODENOSCOPY N/A 09/03/2017    Procedure: ESOPHAGOGASTRODUODENOSCOPY (EGD);  Surgeon: Doran Stabler, MD;  Location: [facility];  Service: Gastroenterology;  Laterality: N/A;  . FINGER FRACTURE SURGERY Left    "shattered my pinky"  . FRACTURE SURGERY    . I & D EXTREMITY Left 09/18/2018   Procedure: IRRIGATION AND DEBRIDEMENT EXTREMITY;  Surgeon: Leanora Cover, MD;  Location: [facility];  Service: Orthopedics;  Laterality: Left;  . IR PARACENTESIS  07/23/2017  . IR PARACENTESIS  07/2017   "did it twice in the same week" (09/01/2017)  . SHOULDER OPEN ROTATOR CUFF REPAIR Right   . TOOTH EXTRACTION  08/2019  . TUBAL LIGATION    . VAGINAL HYSTERECTOMY         Review of Systems  Constitutional: Negative for chills, fatigue, fever and unexpected weight change.  Respiratory: Negative for cough, chest tightness, shortness of breath and wheezing.   Cardiovascular: Negative for chest pain and leg swelling.  Gastrointestinal: Negative for abdominal distention, constipation, diarrhea, nausea and vomiting.  Neurological: Negative for dizziness, weakness, light-headedness and headaches.  Hematological: Does not bruise/bleed easily.      Objective:    BP 111/76   Pulse 73   Temp 97.9 F (36.6 C) (Oral)   Wt 147 lb (66.7 kg)   BMI 29.69 kg/m  Nursing note and vital signs reviewed.  Physical Exam Constitutional:      General: She is not in acute distress.    Appearance: She is well-developed.  Cardiovascular:     Rate and Rhythm: Normal rate and regular rhythm.     Heart sounds: Normal heart sounds. No murmur. No friction rub. No gallop.   Pulmonary:     Effort: Pulmonary effort is normal. No respiratory distress.     Breath sounds: Normal breath sounds. No wheezing or rales.  Chest:     Chest wall: No tenderness.  Abdominal:     General: Bowel sounds are normal. There is no distension.     Palpations: Abdomen is soft. There is no mass.     Tenderness: There is no abdominal tenderness. There is no guarding or  rebound.  Skin:    General: Skin is warm and dry.  Neurological:     Mental Status: She is alert and oriented to person, place, and time.  Psychiatric:        Behavior: Behavior normal.        Thought Content: Thought content normal.        Judgment: Judgment normal.      Depression screen [facility] 2/9 05/05/2019 04/22/2019 02/19/2019 09/29/2018 12/16/2017  Decreased Interest 0 0 0 0 0  Down, Depressed, Hopeless 0 0 0 0 1  PHQ - 2 Score 0 0 0 0 1  Some encounter  information is confidential and restricted. Go to Review Flowsheets activity to see all data.  Some recent data might be hidden       Assessment & Plan:    Patient Active Problem List   Diagnosis Date Noted  . Abnormal urine 12/13/2019  . History of alcohol abuse 09/07/2019  . Insomnia 05/05/2019  . Dizziness 04/02/2019  . Bruising 04/02/2019  . Hyponatremia 04/02/2019  . Head injury   . Scalp laceration, initial encounter   . Fall 01/20/2019  . Epistaxis 01/20/2019  . Altered mental status   . 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 Other mixed anxiety disorders 10/27/2018  . Neuropathy 10/27/2018  . Abscess of bursa of left elbow 09/23/2018  . Olecranon bursitis of left elbow   . Erysipelas 09/13/2018  . Acute metabolic encephalopathy 0000000  . Overdose 08/22/2018  . Hypotension 08/22/2018  . Seizure ([place]) 08/22/2018  . Substance induced mood disorder ([place]) 07/17/2018  . Cocaine dependence without complication ([place]) [id number]  . Polysubstance abuse ([place]) 04/08/2018  . 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 Hepatitis C 03/18/2018  . Portal hypertension ([place]) 03/18/2018  . Alcoholic intoxication without complication ([place]) [id number]  . Depression 03/18/2018  . Upper GI bleed 02/10/2018  . Alcohol use disorder, severe, in early remission ([place]) 02/10/2018  . Chronic anemia   . Thrombocytopenia due to drugs   . Suicidal ideation   . Thrombocytopenia ([place]) 01/02/2018  . GERD  (gastroesophageal reflux disease) 11/01/2017  . Trichimoniasis 10/30/2017  . Alcohol dependence ([place]) 10/29/2017  . MDD (major depressive disorder), severe ([place]) 10/28/2017  . Acute hyperactive alcohol withdrawal delirium ([place]) 10/09/2017  . Schizophrenia ([place]) 10/09/2017  . Alcohol abuse with alcohol-induced mood disorder ([place])   . Alcohol withdrawal syndrome with complication ([place]) [id number]  . Esophageal varices without bleeding ([place])   . Hematemesis 09/01/2017  . Ascites due to alcoholic cirrhosis ([place])   . Decompensated hepatic cirrhosis ([place]) 07/23/2017  . Alcohol abuse 07/23/2017  . Hepatitis C antibody positive in blood 07/23/2017  . Hypokalemia 07/23/2017  . Jaundice 07/23/2017  . Coagulopathy ([place]) 07/23/2017  . Hypomagnesemia 07/23/2017  . Pancytopenia ([place]) 07/23/2017  . Alcoholic cirrhosis of liver with ascites ([place]) 07/23/2017  . Bacterial vaginosis 06/03/2017  . UTI (urinary tract infection) 06/02/2017     Problem List Items Addressed This Visit      Digestive   Hepatitis C - Primary    Ms. Latchford is completing her treatment for Hepatitis C with about 1 week remaining with her Epclusa and appears to have good adherence and tolerance thus far. Check HCV RNA level today. Most recent was undetectable. Will plan for repeat HCV load testing in 3 months to confirm sustained viremic response. Will need ongoing car with gastroenterology for cirrhosis and monitoring for hepatocellular carcinoma.       Relevant Orders   Hepatitis C RNA quantitative       I am having Clelia L. Moris maintain her lactulose, multivitamin with minerals, polyethylene glycol, Sofosbuvir-Velpatasvir, levETIRAcetam, FLUoxetine, hydrOXYzine, furosemide, ARIPiprazole, clonazePAM, mirtazapine, gabapentin, and HYDROcodone-acetaminophen.   Follow-up: Return in about 3 months (around 04/29/2020), or if symptoms worsen or fail to improve.   Terri Piedra, MSN, FNP-C Nurse Practitioner [facility] [place] number: [phone]

## 2020-01-28 NOTE — Assessment & Plan Note (Signed)
Ms. Druker is completing her treatment for Hepatitis C with about 1 week remaining with her Epclusa and appears to have good adherence and tolerance thus far. Check HCV RNA level today. Most recent was undetectable. Will plan for repeat HCV load testing in 3 months to confirm sustained viremic response. Will need ongoing car with gastroenterology for cirrhosis and monitoring for hepatocellular carcinoma.

## 2020-01-28 NOTE — Patient Instructions (Signed)
Nice to see you.  We will check your blood work today.  Continue to take your Epclusa as prescribed until completed.  Plan for follow up in 3 months for a lab visit to confirm cure.   Have a great day and stay safe!

## 2020-01-30 LAB — HEPATITIS C RNA QUANTITATIVE
HCV Quantitative Log: <1.18 Log IU/mL
HCV RNA, PCR, QN: <15 IU/mL

## 2020-01-31 NOTE — Telephone Encounter (Signed)
Sent to NP 

## 2020-02-01 NOTE — Telephone Encounter (Signed)
Patient information faxed to [facility] Fax [phone], [place].

## 2020-02-07 ENCOUNTER — Encounter (HOSPITAL_COMMUNITY): Payer: Self-pay | Admitting: Obstetrics and Gynecology

## 2020-02-07 ENCOUNTER — Emergency Department (HOSPITAL_COMMUNITY)
Admission: EM | Admit: 2020-02-07 | Discharge: 2020-02-08 | Disposition: A | Discharge status: Home / Self Care | Payer: Medicaid Other | Attending: Emergency Medicine | Admitting: Emergency Medicine

## 2020-02-07 ENCOUNTER — Other Ambulatory Visit: Payer: Self-pay

## 2020-02-07 ENCOUNTER — Emergency Department (HOSPITAL_COMMUNITY): Payer: Medicaid Other

## 2020-02-07 DIAGNOSIS — Z79899 Other long term (current) drug therapy: Secondary | ICD-10-CM | POA: Diagnosis not present

## 2020-02-07 DIAGNOSIS — F10920 Alcohol use, unspecified with intoxication, uncomplicated: Secondary | ICD-10-CM | POA: Insufficient documentation

## 2020-02-07 DIAGNOSIS — R42 Dizziness and giddiness: Secondary | ICD-10-CM | POA: Insufficient documentation

## 2020-02-07 LAB — COMPREHENSIVE METABOLIC PANEL
ALT: 40 U/L (ref 0–44)
AST: 78 U/L — ABNORMAL HIGH (ref 15–41)
Albumin: 4 g/dL (ref 3.5–5.0)
Alkaline Phosphatase: 175 U/L — ABNORMAL HIGH (ref 38–126)
Anion gap: 14 (ref 5–15)
BUN: <5 mg/dL — ABNORMAL LOW (ref 6–20)
CO2: 21 mmol/L — ABNORMAL LOW (ref 22–32)
Calcium: 8.6 mg/dL — ABNORMAL LOW (ref 8.9–10.3)
Chloride: 106 mmol/L (ref 98–111)
Creatinine, Ser: 0.57 mg/dL (ref 0.44–1.00)
GFR calc Af Amer: >60 mL/min (ref >60)
GFR calc non Af Amer: >60 mL/min (ref >60)
Glucose, Bld: 90 mg/dL (ref 70–99)
Potassium: 4.2 mmol/L (ref 3.5–5.1)
Sodium: 141 mmol/L (ref 135–145)
Total Bilirubin: 1.2 mg/dL (ref 0.3–1.2)
Total Protein: 8.2 g/dL — ABNORMAL HIGH (ref 6.5–8.1)

## 2020-02-07 LAB — CBC
HCT: 46.4 % — ABNORMAL HIGH (ref 36.0–46.0)
Hemoglobin: 14.8 g/dL (ref 12.0–15.0)
MCH: 29.8 pg (ref 26.0–34.0)
MCHC: 31.9 g/dL (ref 30.0–36.0)
MCV: 93.4 fL (ref 80.0–100.0)
Platelets: 71 10*3/uL — ABNORMAL LOW (ref 150–400)
RBC: 4.97 MIL/uL (ref 3.87–5.11)
RDW: 16.1 % — ABNORMAL HIGH (ref 11.5–15.5)
WBC: 4.1 10*3/uL (ref 4.0–10.5)
nRBC: 0 % (ref 0.0–0.2)

## 2020-02-07 LAB — ETHANOL: Alcohol, Ethyl (B): 341 mg/dL (ref <10)

## 2020-02-07 MED ORDER — SODIUM CHLORIDE 0.9 % IV BOLUS
1000.0000 mL | Freq: Once | INTRAVENOUS | Status: AC
  Administered 2020-02-07: 1000 mL via INTRAVENOUS

## 2020-02-07 NOTE — ED Triage Notes (Signed)
Per EMS: Patient is coming from the road. Patient was drinking alcohol. Patient drank an undisclosed amount of alcohol. Patient reportedly fell and then became combative with EMS after they applied a c-collar.

## 2020-02-07 NOTE — ED Notes (Signed)
Pateint's significant other reports he will come pick patient up but he is about an hour or so away. PA and patient reports understanding of plan.

## 2020-02-07 NOTE — ED Notes (Signed)
RN attempted IV start x1 without success. Blood work was obtained by phlebotomist. Patient resting at this time and in no visible distress.

## 2020-02-07 NOTE — ED Provider Notes (Signed)
[facility] DEPT Provider Note   CSN: [id number] Arrival date & time: 02/07/20  1813     History Chief Complaint  Patient presents with  . Alcohol Intoxication    Briana Sweeney is a 49 y.o. female with history of ETOH abuse, cirrhosis, and pancytopenia who presents with ETOH intoxication and dizziness. Pt is intoxicated limiting history. She states that she fell out of bed 2 days ago and something hit her on the head. Since then she has been very dizzy and has a headache. She also has been drinking daily but states "not that much" today. She drank 6 beers today. It's unclear what exactly happened but she states her ex-boyfriend and her daughter were getting on her nerves today and apparently she was intoxicated in public. EMS was called. Pt was belligerent with EMS stating she didn't want to talk to any men.   LEVEL 5 caveat due to intoxication  HPI     Past Medical History:  Diagnosis Date  . Alcohol use   . Anxiety   . Bipolar affective disorder ([place])    With anxiety features  . Bunionette of right foot 11/2019  . Cirrhosis of liver ([place])    Due to alcohol and hepatitis C  . Depression   . 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 ETOHism ([place])   . GERD (gastroesophageal reflux disease)   . Hematemesis 02/10/2018  . Hepatitis C 2018   hepatitis c and alcohol related hepatitis  . Heroin abuse ([place])   . History of blood transfusion    "blood doesn't clot; I fell down and had to have a transfusion"  . History of kidney stones   . History of thrombocytopenia   . Menopause 2016  . Migraine    "when I get really stressed" (09/01/2017)  . S/P foot surgery, right 11/2019  . Schizophrenia ([place])   . Seizures ([place])    "when I run out of my RX; lots recently" (09/01/2017)  . Thrombocytopenia [facility])     Patient Active Problem List   Diagnosis Date Noted  . Abnormal urine 12/13/2019  . History of alcohol abuse 09/07/2019  . Insomnia  05/05/2019  . Dizziness 04/02/2019  . Bruising 04/02/2019  . Hyponatremia 04/02/2019  . Head injury   . Scalp laceration, initial encounter   . Fall 01/20/2019  . Epistaxis 01/20/2019  . Altered mental status   . 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 Other mixed anxiety disorders 10/27/2018  . Neuropathy 10/27/2018  . Abscess of bursa of left elbow 09/23/2018  . Olecranon bursitis of left elbow   . Erysipelas 09/13/2018  . Acute metabolic encephalopathy 0000000  . Overdose 08/22/2018  . Hypotension 08/22/2018  . Seizure ([place]) 08/22/2018  . Substance induced mood disorder ([place]) 07/17/2018  . Cocaine dependence without complication ([place]) [id number]  . Polysubstance abuse ([name]) 04/08/2018  . Encephalopathy, 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[place]) 04/08/2018  . Hepatitis C 03/18/2018  . Portal hypertension ([place]) 03/18/2018  . Alcoholic intoxication without complication ([place]) [id number]  . Depression 03/18/2018  . Upper GI bleed 02/10/2018  . Alcohol use disorder, severe, in early remission ([place]) 02/10/2018  . Chronic anemia   . Thrombocytopenia due to drugs   . Suicidal ideation   . Thrombocytopenia ([place]) 01/02/2018  . GERD (gastroesophageal reflux disease) 11/01/2017  . Trichimoniasis 10/30/2017  . Alcohol dependence ([place]) 10/29/2017  . MDD (major depressive disorder), severe ([place]) 10/28/2017  . Acute hyperactive alcohol withdrawal delirium ([place]) 10/09/2017  . Schizophrenia (  [place]) 10/09/2017  . Alcohol abuse with alcohol-induced mood disorder ([place])   . Alcohol withdrawal syndrome with complication ([place]) [id number]  . Esophageal varices without bleeding ([place])   . Hematemesis 09/01/2017  . Ascites due to alcoholic cirrhosis ([place])   . Decompensated hepatic cirrhosis ([place]) 07/23/2017  . Alcohol abuse 07/23/2017  . Hepatitis C antibody positive in blood 07/23/2017  . Hypokalemia 07/23/2017  . Jaundice 07/23/2017  . Coagulopathy ([place]) 07/23/2017  . Hypomagnesemia 07/23/2017  .  Pancytopenia ([place]) 07/23/2017  . Alcoholic cirrhosis of liver with ascites ([place]) 07/23/2017  . Bacterial vaginosis 06/03/2017  . UTI (urinary tract infection) 06/02/2017    Past Surgical History:  Procedure Laterality Date  . ESOPHAGOGASTRODUODENOSCOPY N/A 09/03/2017   Procedure: ESOPHAGOGASTRODUODENOSCOPY (EGD);  Surgeon: Doran Stabler, MD;  Location: [facility];  Service: Gastroenterology;  Laterality: N/A;  . FINGER FRACTURE SURGERY Left    "shattered my pinky"  . FRACTURE SURGERY    . I & D EXTREMITY Left 09/18/2018   Procedure: IRRIGATION AND DEBRIDEMENT EXTREMITY;  Surgeon: Leanora Cover, MD;  Location: [facility];  Service: Orthopedics;  Laterality: Left;  . IR PARACENTESIS  07/23/2017  . IR PARACENTESIS  07/2017   "did it twice in the same week" (09/01/2017)  . SHOULDER OPEN ROTATOR CUFF REPAIR Right   . TOOTH EXTRACTION  08/2019  . TUBAL LIGATION    . VAGINAL HYSTERECTOMY       OB History    Gravida  3   Para      Term      Preterm      AB      Living  2     SAB      TAB      Ectopic      Multiple      Live Births              Family History  Problem Relation Age of Onset  . Lung cancer Mother 12  . Alcohol abuse Mother   . Throat cancer Father 62    Social History   Tobacco Use  . Smoking status: Never Smoker  . Smokeless tobacco: Never Used  Substance Use Topics  . Alcohol use: Yes    Alcohol/week: 6.0 standard drinks    Types: 6 Cans of beer per week    Comment:  6 pack per week.  . Drug use: Not Currently    Comment: cocaiane not currently    Home Medications Prior to Admission medications   Medication Sig Start Date End Date Taking? Authorizing Provider  ARIPiprazole (ABILIFY) 5 MG tablet Take 1 tablet (5 mg total) by mouth daily. 01/07/20   Pucilowski, Marchia Bond, MD  clonazePAM (KLONOPIN) 1 MG tablet Take 1 tablet (1 mg total) by mouth 2 (two) times daily as needed for anxiety. 01/14/20 03/14/20  Pucilowski, Marchia Bond, MD    FLUoxetine (PROZAC) 40 MG capsule Take 1 capsule (40 mg total) by mouth daily. 11/15/19   Pucilowski, Marchia Bond, MD  furosemide (LASIX) 40 MG tablet Take 1 tablet (40 mg total) by mouth daily. Patient not taking: Reported on 01/28/2020 12/13/19 08/09/20  [name], FNP  gabapentin (NEURONTIN) 300 MG capsule Take 2 capsules (600 mg), by mouth, 3 times a day. 01/04/20   [name], FNP  HYDROcodone-acetaminophen (NORCO) 10-325 MG tablet Take 1 tablet by mouth every 8 (eight) hours as needed. Patient not taking: Reported on 01/28/2020 01/04/20   [name], FNP  hydrOXYzine (  ATARAX/VISTARIL) 25 MG tablet Take 1 tablet (25 mg total) by mouth every 6 (six) hours as needed for itching. Patient not taking: Reported on 01/28/2020 12/13/19   [name], FNP  lactulose (CHRONULAC) 10 GM/15ML solution Take 30 mLs (20 g total) by mouth 3 (three) times daily. Goal to have 2-3 BMs daily Patient taking differently: Take 20 g by mouth 3 (three) times daily as needed for moderate constipation. Goal to have 2-3 BMs daily 04/13/19   Hosie Poisson, MD  levETIRAcetam (KEPPRA) 500 MG tablet Take 1 tablet (500 mg total) by mouth 2 (two) times daily. 11/09/19   Cameron Sprang, MD  mirtazapine (REMERON) 30 MG tablet Take 1 tablet (30 mg total) by mouth at bedtime. 01/07/20 03/07/20  Pucilowski, Marchia Bond, MD  Multiple Vitamin (MULTIVITAMIN WITH MINERALS) TABS tablet Take 1 tablet by mouth daily. 04/13/19   Hosie Poisson, MD  polyethylene glycol (MIRALAX / GLYCOLAX) 17 g packet Take 17 g by mouth 2 (two) times daily. Patient taking differently: Take 17 g by mouth daily as needed for mild constipation.  04/13/19   Hosie Poisson, MD  Sofosbuvir-Velpatasvir (EPCLUSA) 400-100 MG TABS Take 1 tablet by mouth daily. Take 1 tablet by mouth daily. 07/12/19   [place], FNP    Allergies    Other  Review of Systems   Review of Systems  Unable to perform ROS: Other    Physical Exam Updated Vital Signs BP  102/71   Pulse 93   Temp (!) 97.3 F (36.3 C) (Oral)   Resp 16   SpO2 99%   Physical Exam Vitals and nursing note reviewed.  Constitutional:      General: She is not in acute distress.    Appearance: Normal appearance. She is well-developed. She is not ill-appearing.     Comments: Lying in a dark room. Slurred speech  HENT:     Head: Normocephalic and atraumatic.     Mouth/Throat:     Mouth: Mucous membranes are dry.  Eyes:     General: No scleral icterus.       Right eye: No discharge.        Left eye: No discharge.     Conjunctiva/sclera: Conjunctivae normal.     Pupils: Pupils are equal, round, and reactive to light.  Cardiovascular:     Rate and Rhythm: Normal rate and regular rhythm.  Pulmonary:     Effort: Pulmonary effort is normal. No respiratory distress.     Breath sounds: Normal breath sounds.  Abdominal:     General: There is no distension.  Musculoskeletal:     Cervical back: Normal range of motion.  Skin:    General: Skin is warm and dry.     Comments: Abrasion on the R knee  Neurological:     Mental Status: She is alert and oriented to person, place, and time.     Comments: Lying on stretcher in NAD. GCS 15. Speaks in a clear voice. Cranial nerves II through XII grossly intact. 5/5 strength in all extremities. Sensation fully intact.  Bilateral finger-to-nose intact. Ambulatory    Psychiatric:        Behavior: Behavior normal.     ED Results / Procedures / Treatments   Labs (all labs ordered are listed, but only abnormal results are displayed) Labs Reviewed  COMPREHENSIVE METABOLIC PANEL - Abnormal; Notable for the following components:      Result Value   CO2 21 (*)    BUN <5 (*)  Calcium 8.6 (*)    Total Protein 8.2 (*)    AST 78 (*)    Alkaline Phosphatase 175 (*)    All other components within normal limits  ETHANOL - Abnormal; Notable for the following components:   Alcohol, Ethyl (B) 341 (*)    All other components within normal limits   CBC - Abnormal; Notable for the following components:   HCT 46.4 (*)    RDW 16.1 (*)    Platelets 71 (*)    All other components within normal limits    EKG None  Radiology CT Head Wo Contrast  Result Date: 02/07/2020 CLINICAL DATA:  Alcohol intoxication.  Fall. EXAM: CT HEAD WITHOUT CONTRAST CT CERVICAL SPINE WITHOUT CONTRAST TECHNIQUE: Multidetector CT imaging of the head and cervical spine was performed following the standard protocol without intravenous contrast. Multiplanar CT image reconstructions of the cervical spine were also generated. COMPARISON:  12/20/2019 FINDINGS: CT HEAD FINDINGS Brain: There is no mass, hemorrhage or extra-axial collection. The size and configuration of the ventricles and extra-axial CSF spaces are normal. The brain parenchyma is normal, without evidence of acute or chronic infarction. Vascular: No abnormal hyperdensity of the major intracranial arteries or dural venous sinuses. No intracranial atherosclerosis. Skull: The visualized skull base, calvarium and extracranial soft tissues are normal. Sinuses/Orbits: No fluid levels or advanced mucosal thickening of the visualized paranasal sinuses. No mastoid or middle ear effusion. The orbits are normal. CT CERVICAL SPINE FINDINGS Alignment: No static subluxation. Facets are aligned. Occipital condyles are normally positioned. Skull base and vertebrae: No acute fracture. Soft tissues and spinal canal: No prevertebral fluid or swelling. No visible canal hematoma. Disc levels: No advanced spinal canal or neural foraminal stenosis. Left C4-5 facet arthrosis. Upper chest: No pneumothorax, pulmonary nodule or pleural effusion. Other: Normal visualized paraspinal cervical soft tissues. IMPRESSION: 1. No acute intracranial abnormality. 2. No acute fracture or static subluxation of the cervical spine. Electronically Signed   By: Ulyses Jarred M.D.   On: 02/07/2020 20:42   CT Cervical Spine Wo Contrast  Result Date:  02/07/2020 CLINICAL DATA:  Alcohol intoxication.  Fall. EXAM: CT HEAD WITHOUT CONTRAST CT CERVICAL SPINE WITHOUT CONTRAST TECHNIQUE: Multidetector CT imaging of the head and cervical spine was performed following the standard protocol without intravenous contrast. Multiplanar CT image reconstructions of the cervical spine were also generated. COMPARISON:  12/20/2019 FINDINGS: CT HEAD FINDINGS Brain: There is no mass, hemorrhage or extra-axial collection. The size and configuration of the ventricles and extra-axial CSF spaces are normal. The brain parenchyma is normal, without evidence of acute or chronic infarction. Vascular: No abnormal hyperdensity of the major intracranial arteries or dural venous sinuses. No intracranial atherosclerosis. Skull: The visualized skull base, calvarium and extracranial soft tissues are normal. Sinuses/Orbits: No fluid levels or advanced mucosal thickening of the visualized paranasal sinuses. No mastoid or middle ear effusion. The orbits are normal. CT CERVICAL SPINE FINDINGS Alignment: No static subluxation. Facets are aligned. Occipital condyles are normally positioned. Skull base and vertebrae: No acute fracture. Soft tissues and spinal canal: No prevertebral fluid or swelling. No visible canal hematoma. Disc levels: No advanced spinal canal or neural foraminal stenosis. Left C4-5 facet arthrosis. Upper chest: No pneumothorax, pulmonary nodule or pleural effusion. Other: Normal visualized paraspinal cervical soft tissues. IMPRESSION: 1. No acute intracranial abnormality. 2. No acute fracture or static subluxation of the cervical spine. Electronically Signed   By: Ulyses Jarred M.D.   On: 02/07/2020 20:42    Procedures Procedures (including  critical care time)  Medications Ordered in ED Medications  sodium chloride 0.9 % bolus 1,000 mL (0 mLs Intravenous Stopped 02/08/20 0012)    ED Course  I have reviewed the triage vital signs and the nursing notes.  Pertinent labs &  imaging results that were available during my care of the patient were reviewed by me and considered in my medical decision making (see chart for details).  49 year old female presents with alcohol intoxication and dizziness with reported head injury. Her vitals are normal. She is intoxicated but neuro exam is grossly normal. She does have evidence of a minor head injury with some bruising of the forehead. Labs shows elevated ETOH, mildly low bicarb (21), mildly elevated AP and AST. She still has thrombocytopenia but this is improved. Will order CT head and C-spine and give IVF as she appears dry.  CT head and C-spine are negative. She is ambulating without difficulty and tolerated PO. A significant other was called by nursing to have her picked up and she was discharged in stable condition.  MDM Rules/Calculators/A&P                       Final Clinical Impression(s) / ED Diagnoses Final diagnoses:  Alcoholic intoxication without complication [facility])    Rx / DC Orders ED Discharge Orders    None       Recardo Evangelist, PA-C 02/09/20 [id number]    Veryl Speak, MD 02/10/20 [phone]

## 2020-02-08 NOTE — ED Notes (Signed)
Pt driven home by friend who came to pick her up. Pt ambulated out of ED under her own power.

## 2020-02-10 ENCOUNTER — Inpatient Hospital Stay: Payer: Medicaid Other | Attending: Hematology & Oncology

## 2020-02-10 ENCOUNTER — Inpatient Hospital Stay: Payer: Medicaid Other

## 2020-02-10 ENCOUNTER — Other Ambulatory Visit: Payer: Self-pay

## 2020-02-10 VITALS — BP 99/81 | HR 70 | Temp 97.1°F | Resp 18

## 2020-02-10 DIAGNOSIS — D693 Immune thrombocytopenic purpura: Secondary | ICD-10-CM | POA: Insufficient documentation

## 2020-02-10 DIAGNOSIS — D696 Thrombocytopenia, unspecified: Secondary | ICD-10-CM

## 2020-02-10 LAB — CMP (CANCER CENTER ONLY)
ALT: 36 U/L (ref 0–44)
AST: 78 U/L — ABNORMAL HIGH (ref 15–41)
Albumin: 4.3 g/dL (ref 3.5–5.0)
Alkaline Phosphatase: 204 U/L — ABNORMAL HIGH (ref 38–126)
Anion gap: 11 (ref 5–15)
BUN: 7 mg/dL (ref 6–20)
CO2: 28 mmol/L (ref 22–32)
Calcium: 9.6 mg/dL (ref 8.9–10.3)
Chloride: 99 mmol/L (ref 98–111)
Creatinine: 0.64 mg/dL (ref 0.44–1.00)
GFR, Est AFR Am: >60 mL/min (ref >60)
GFR, Estimated: >60 mL/min (ref >60)
Glucose, Bld: 92 mg/dL (ref 70–99)
Potassium: 4.7 mmol/L (ref 3.5–5.1)
Sodium: 138 mmol/L (ref 135–145)
Total Bilirubin: 1 mg/dL (ref 0.3–1.2)
Total Protein: 8.6 g/dL — ABNORMAL HIGH (ref 6.5–8.1)

## 2020-02-10 LAB — CBC WITH DIFFERENTIAL (CANCER CENTER ONLY)
Abs Immature Granulocytes: 0 10*3/uL (ref 0.00–0.07)
Basophils Absolute: 0 10*3/uL (ref 0.0–0.1)
Basophils Relative: 1 %
Eosinophils Absolute: 0.2 10*3/uL (ref 0.0–0.5)
Eosinophils Relative: 6 %
HCT: 45.9 % (ref 36.0–46.0)
Hemoglobin: 14.9 g/dL (ref 12.0–15.0)
Immature Granulocytes: 0 %
Lymphocytes Relative: 29 %
Lymphs Abs: 0.8 10*3/uL (ref 0.7–4.0)
MCH: 30 pg (ref 26.0–34.0)
MCHC: 32.5 g/dL (ref 30.0–36.0)
MCV: 92.4 fL (ref 80.0–100.0)
Monocytes Absolute: 0.4 10*3/uL (ref 0.1–1.0)
Monocytes Relative: 14 %
Neutro Abs: 1.4 10*3/uL — ABNORMAL LOW (ref 1.7–7.7)
Neutrophils Relative %: 50 %
Platelet Count: 58 10*3/uL — ABNORMAL LOW (ref 150–400)
RBC: 4.97 MIL/uL (ref 3.87–5.11)
RDW: 16.3 % — ABNORMAL HIGH (ref 11.5–15.5)
WBC Count: 2.7 10*3/uL — ABNORMAL LOW (ref 4.0–10.5)
nRBC: 0 % (ref 0.0–0.2)

## 2020-02-10 LAB — PLATELET BY CITRATE

## 2020-02-10 MED ORDER — ROMIPLOSTIM 250 MCG ~~LOC~~ SOLR
130.0000 ug | Freq: Once | SUBCUTANEOUS | Status: AC
  Administered 2020-02-10: 130 ug via SUBCUTANEOUS
  Filled 2020-02-10: qty 0.26

## 2020-02-10 NOTE — Patient Instructions (Signed)
Romiplostim injection What is this medicine? ROMIPLOSTIM (roe mi PLOE stim) helps your body make more platelets. This medicine is used to treat low platelets caused by chronic idiopathic thrombocytopenic purpura (ITP). This medicine may be used for other purposes; ask your health care provider or pharmacist if you have questions. COMMON BRAND NAME(S): Nplate What should I tell my health care provider before I take this medicine? They need to know if you have any of these conditions:  bleeding disorders  bone marrow problem, like blood cancer or myelodysplastic syndrome  history of blood clots  liver disease  surgery to remove your spleen  an unusual or allergic reaction to romiplostim, mannitol, other medicines, foods, dyes, or preservatives  pregnant or trying to get pregnant  breast-feeding How should I use this medicine? This medicine is for injection under the skin. It is given by a health care professional in a hospital or clinic setting. A special MedGuide will be given to you before your injection. Read this information carefully each time. Talk to your pediatrician regarding the use of this medicine in children. While this drug may be prescribed for children as young as 1 year for selected conditions, precautions do apply. Overdosage: If you think you have taken too much of this medicine contact a poison control center or emergency room at once. NOTE: This medicine is only for you. Do not share this medicine with others. What if I miss a dose? It is important not to miss your dose. Call your doctor or health care professional if you are unable to keep an appointment. What may interact with this medicine? Interactions are not expected. This list may not describe all possible interactions. Give your health care provider a list of all the medicines, herbs, non-prescription drugs, or dietary supplements you use. Also tell them if you smoke, drink alcohol, or use illegal drugs.  Some items may interact with your medicine. What should I watch for while using this medicine? Your condition will be monitored carefully while you are receiving this medicine. Visit your prescriber or health care professional for regular checks on your progress and for the needed blood tests. It is important to keep all appointments. What side effects may I notice from receiving this medicine? Side effects that you should report to your doctor or health care professional as soon as possible:  allergic reactions like skin rash, itching or hives, swelling of the face, lips, or tongue  signs and symptoms of bleeding such as bloody or black, tarry stools; red or dark brown urine; spitting up blood or brown material that looks like coffee grounds; red spots on the skin; unusual bruising or bleeding from the eyes, gums, or nose  signs and symptoms of a blood clot such as chest pain; shortness of breath; pain, swelling, or warmth in the leg  signs and symptoms of a stroke like changes in vision; confusion; trouble speaking or understanding; severe headaches; sudden numbness or weakness of the face, arm or leg; trouble walking; dizziness; loss of balance or coordination Side effects that usually do not require medical attention (report to your doctor or health care professional if they continue or are bothersome):  headache  pain in arms and legs  pain in mouth  stomach pain This list may not describe all possible side effects. Call your doctor for medical advice about side effects. You may report side effects to FDA at 1-800-FDA-1088. Where should I keep my medicine? This drug is given in a hospital or clinic   and will not be stored at home. NOTE: This sheet is a summary. It may not cover all possible information. If you have questions about this medicine, talk to your doctor, pharmacist, or health care provider.  2020 Elsevier/Gold Standard (2017-08-25 11:10:55)  

## 2020-02-16 ENCOUNTER — Ambulatory Visit: Payer: Medicaid Other | Admitting: Podiatry

## 2020-02-23 ENCOUNTER — Inpatient Hospital Stay: Payer: Medicaid Other

## 2020-02-23 ENCOUNTER — Inpatient Hospital Stay: Payer: Medicaid Other | Admitting: Family

## 2020-03-06 ENCOUNTER — Other Ambulatory Visit: Payer: Self-pay

## 2020-03-06 ENCOUNTER — Telehealth (INDEPENDENT_AMBULATORY_CARE_PROVIDER_SITE_OTHER): Payer: Medicaid Other | Admitting: Psychiatry

## 2020-03-06 DIAGNOSIS — F413 Other mixed anxiety disorders: Secondary | ICD-10-CM | POA: Diagnosis not present

## 2020-03-06 DIAGNOSIS — F1994 Other psychoactive substance use, unspecified with psychoactive substance-induced mood disorder: Secondary | ICD-10-CM | POA: Diagnosis not present

## 2020-03-06 DIAGNOSIS — F1021 Alcohol dependence, in remission: Secondary | ICD-10-CM | POA: Diagnosis not present

## 2020-03-06 DIAGNOSIS — F419 Anxiety disorder, unspecified: Secondary | ICD-10-CM

## 2020-03-06 MED ORDER — ARIPIPRAZOLE 10 MG PO TABS: 10.0000 mg | ORAL_TABLET | Freq: Every day | ORAL | 2 refills | Status: DC

## 2020-03-06 MED ORDER — CLONAZEPAM 1 MG PO TABS: 1.0000 mg | ORAL_TABLET | Freq: Two times a day (BID) | ORAL | 1 refills | Status: DC | PRN

## 2020-03-06 MED ORDER — FLUOXETINE HCL 40 MG PO CAPS: 40.0000 mg | ORAL_CAPSULE | Freq: Every day | ORAL | 2 refills | Status: DC

## 2020-03-06 MED ORDER — MIRTAZAPINE 30 MG PO TABS: 30.0000 mg | ORAL_TABLET | Freq: Every day | ORAL | 2 refills | Status: DC

## 2020-03-06 MED FILL — ARIPIPRAZOLE 10 MG TABS: 10 | 30 days supply | Qty: 30 | Fill #0

## 2020-03-06 MED FILL — MIRTAZAPINE 30 MG TABLET: 30 | 30 days supply | Qty: 30 | Fill #0

## 2020-03-06 NOTE — Progress Notes (Signed)
BH MD/PA/NP OP Progress Note  03/06/2020 9:44 AM Briana Sweeney  MRN:  [id number] Interview was conducted by phone and I verified that I was speaking with the correct person using two identifiers. I discussed the limitations of evaluation and management by telemedicine and  the availability of in person appointments. Patient expressed understanding and agreed to proceed. Patient location - home; physician - home office.  Chief Complaint: More anxious and depressed.  HPI: 49 yo divorced female with a long hx of alcohol use disorder, polysubstance abuse (cocaine, heroin), and past diagnoses of major depressive disorder vs substance-induced mood disorder vs bipolar disorder. Diagnosis of schizophrenia is also mentioned in the record but patient is unable to provide any clear history of psychotic episodes unrelated to her drug/alcohol use. She was first treated for mental problems at age 19 when she became depressed and OD on meds. She has been taking fluoxetine (up to 60 mg) for over 20 years per her report. She also tried buspirone, lorazepam and hydroxyzine for anxiety with no clear benefit. Recently prescribed trazodone for insomnia does not appear to work either. She has never been on any mood stabilizers except for lithium or antipsychotics apart from haloperidol. She did not like how she felt on either of those. She started abusing alcohol at age of 30. At one time she was drinking a 1/2 gallon of vodka plus some beer on a daily basis. She has a hx of alcohol withdrawal delirium. She has some periods of sobriety lasting few months at a time but cannot provide more details. She has alcohol liver cirrhosis, thrombocytopenia and hepatitis C. She minimizes use of illicit drugs claiming that she only used them when "partying".We have added mirtazapine and she reports that sleep has improved. She continues to complain primarily of severe anxiety both generalized and panic type. She has no support, struggles  financially. Briana Sweeney reports that she has not been drinking alcohol sinceearly April then on last day in May. She has not been taking buspirone or hydroxyzine but reports that clonazepam prescribed recently is very helpful for anxiety and helps her stay away from alcohol? She just attended funeral for a young cousin (22 yo) who has had severe alcohol problem. She has been more depressed and anxious because of that.  Visit Diagnosis:    ICD-10-CM   1. Other mixed anxiety disorders  F41.3   2. Anxiety  F41.9 FLUoxetine (PROZAC) 40 MG capsule  3. Substance induced mood disorder (HCC)  F19.94   4. Alcohol use disorder, severe, in early remission ([place])  F10.21     Past Psychiatric History: Please see intake H&P.  Past Medical History:  Past Medical History:  Diagnosis Date  . Alcohol use   . Anxiety   . Bipolar affective disorder ([place])    With anxiety features  . Bunionette of right foot 11/2019  . Cirrhosis of liver ([place])    Due to alcohol and hepatitis C  . Depression   . 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 ETOHism ([place])   . GERD (gastroesophageal reflux disease)   . Hematemesis 02/10/2018  . Hepatitis C 2018   hepatitis c and alcohol related hepatitis  . Heroin abuse ([place])   . History of blood transfusion    "blood doesn't clot; I fell down and had to have a transfusion"  . History of kidney stones   . History of thrombocytopenia   . Menopause 2016  . Migraine    "when I get really  stressed" (09/01/2017)  . S/P foot surgery, right 11/2019  . Schizophrenia ([place])   . Seizures ([place])    "when I run out of my RX; lots recently" (09/01/2017)  . Thrombocytopenia ([place])     Past Surgical History:  Procedure Laterality Date  . ESOPHAGOGASTRODUODENOSCOPY N/A 09/03/2017   Procedure: ESOPHAGOGASTRODUODENOSCOPY (EGD);  Surgeon: Doran Stabler, MD;  Location: [facility];  Service: Gastroenterology;  Laterality: N/A;  . FINGER FRACTURE SURGERY Left    "shattered my pinky"   . FRACTURE SURGERY    . I & D EXTREMITY Left 09/18/2018   Procedure: IRRIGATION AND DEBRIDEMENT EXTREMITY;  Surgeon: Leanora Cover, MD;  Location: [facility];  Service: Orthopedics;  Laterality: Left;  . IR PARACENTESIS  07/23/2017  . IR PARACENTESIS  07/2017   "did it twice in the same week" (09/01/2017)  . SHOULDER OPEN ROTATOR CUFF REPAIR Right   . TOOTH EXTRACTION  08/2019  . TUBAL LIGATION    . VAGINAL HYSTERECTOMY      Family Psychiatric History: Reviewed.  Family History:  Family History  Problem Relation Age of Onset  . Lung cancer Mother 29  . Alcohol abuse Mother   . Throat cancer Father 22    Social History:  Social History   Socioeconomic History  . Marital status: Divorced    Spouse name: Not on file  . Number of children: 2  . Years of education: Not on file  . Highest education level: Not on file  Occupational History  . Occupation: applying for disability  Tobacco Use  . Smoking status: Never Smoker  . Smokeless tobacco: Never Used  Vaping Use  . Vaping Use: Never used  Substance and Sexual Activity  . Alcohol use: Yes    Alcohol/week: 6.0 standard drinks    Types: 6 Cans of beer per week    Comment:  6 pack per week.  . Drug use: Not Currently    Comment: cocaiane not currently  . Sexual activity: Yes  Other Topics Concern  . Not on file  Social History Narrative   She moved with a boyfriend to [place] and was followed at [facility].  He died of a massive heart attack in 8/18, per her report, and so she moved back to [place] and is living with a friend.      Right handed   2 story home    Lives alone 11/09/19   Social Determinants of Health   Financial Resource Strain:   . Difficulty of Paying Living Expenses:   Food Insecurity:   . Worried About Charity fundraiser in the Last Year:   . Arboriculturist in the Last Year:   Transportation Needs:   . Film/video editor (Medical):   [name] Kitchen Lack of Transportation (Non-Medical):    Physical Activity:   . Days of Exercise per Week:   . Minutes of Exercise per Session:   Stress:   . Feeling of Stress :   Social Connections:   . Frequency of Communication with Friends and Family:   . Frequency of Social Gatherings with Friends and Family:   . Attends Religious Services:   . Active Member of Clubs or Organizations:   . Attends Archivist Meetings:   [name] Kitchen Marital Status:     Allergies:  Allergies  Allergen Reactions  . Other     Platelets: Rx chest pain, tremors, body aches    Metabolic Disorder Labs: Lab Results  Component Value Date  HGBA1C 5.1 12/15/2019   MPG 100 12/15/2019   No results found for: PROLACTIN Lab Results  Component Value Date   CHOL 213 (H) 12/13/2019   TRIG 85 12/13/2019   HDL 108 12/13/2019   CHOLHDL 2.0 12/13/2019   LDLCALC 90 12/13/2019   LDLCALC 89 05/05/2019   Lab Results  Component Value Date   TSH 0.304 (L) 12/16/2019   TSH 0.618 12/13/2019    Therapeutic Level Labs: No results found for: LITHIUM No results found for: VALPROATE No components found for:  CBMZ  Current Medications: Current Outpatient Medications  Medication Sig Dispense Refill  . ARIPiprazole (ABILIFY) 10 MG tablet Take 1 tablet (10 mg total) by mouth daily. 30 tablet 2  . [START ON 03/14/2020] clonazePAM (KLONOPIN) 1 MG tablet Take 1 tablet (1 mg total) by mouth 2 (two) times daily as needed for anxiety. 60 tablet 1  . FLUoxetine (PROZAC) 40 MG capsule Take 1 capsule (40 mg total) by mouth daily. 30 capsule 2  . furosemide (LASIX) 40 MG tablet Take 1 tablet (40 mg total) by mouth daily. (Patient not taking: Reported on 01/28/2020) 30 tablet 7  . gabapentin (NEURONTIN) 300 MG capsule Take 2 capsules (600 mg), by mouth, 3 times a day. 180 capsule 3  . HYDROcodone-acetaminophen (NORCO) 10-325 MG tablet Take 1 tablet by mouth every 8 (eight) hours as needed. (Patient not taking: Reported on 01/28/2020) 30 tablet 0  . hydrOXYzine (ATARAX/VISTARIL)  25 MG tablet Take 1 tablet (25 mg total) by mouth every 6 (six) hours as needed for itching. (Patient not taking: Reported on 01/28/2020) 90 tablet 1  . lactulose (CHRONULAC) 10 GM/15ML solution Take 30 mLs (20 g total) by mouth 3 (three) times daily. Goal to have 2-3 BMs daily (Patient taking differently: Take 20 g by mouth 3 (three) times daily as needed for moderate constipation. Goal to have 2-3 BMs daily) 240 mL 3  . levETIRAcetam (KEPPRA) 500 MG tablet Take 1 tablet (500 mg total) by mouth 2 (two) times daily. 180 tablet 3  . mirtazapine (REMERON) 30 MG tablet Take 1 tablet (30 mg total) by mouth at bedtime. 30 tablet 2  . Multiple Vitamin (MULTIVITAMIN WITH MINERALS) TABS tablet Take 1 tablet by mouth daily.    . polyethylene glycol (MIRALAX / GLYCOLAX) 17 g packet Take 17 g by mouth 2 (two) times daily. (Patient taking differently: Take 17 g by mouth daily as needed for mild constipation. ) 14 each 0  . Sofosbuvir-Velpatasvir (EPCLUSA) 400-100 MG TABS Take 1 tablet by mouth daily. Take 1 tablet by mouth daily. 28 tablet 5   No current facility-administered medications for this visit.     Psychiatric Specialty Exam: Review of Systems  Psychiatric/Behavioral: The patient is nervous/anxious.   All other systems reviewed and are negative.   There were no vitals taken for this visit.There is no height or weight on file to calculate BMI.  General Appearance: NA  Eye Contact:  NA  Speech:  Clear and Coherent and Normal Rate  Volume:  Normal  Mood:  Anxious and Depressed  Affect:  NA  Thought Process:  Goal Directed and Linear  Orientation:  Full (Time, Place, and Person)  Thought Content: Logical   Suicidal Thoughts:  No  Homicidal Thoughts:  No  Memory:  Immediate;   Good Recent;   Good Remote;   Good  Judgement:  Fair  Insight:  Fair  Psychomotor Activity:  NA  Concentration:  Concentration: Good  Recall:  Good  Fund of Knowledge: Good  Language: Good  Akathisia:  Negative   Handed:  Right  AIMS (if indicated): not done  Assets:  Communication Skills Desire for Improvement Housing Resilience  ADL's:  Intact  Cognition: WNL  Sleep:  Fair   Screenings: AIMS     Admission (Discharged) from 08/25/2018 in [facility] Admission (Discharged) from 10/28/2017 in [facility]  AIMS Total Score 0 0    AUDIT     Admission (Discharged) from 08/25/2018 in [place] [facility] Admission (Discharged) from 10/28/2017 in [facility]  Alcohol Use Disorder Identification Test Final Score (AUDIT) 40 32    PHQ2-9     Office Visit from 05/05/2019 in [place] Office Visit from 04/22/2019 in [facility] for [facility]ious Disease Office Visit from 02/19/2019 in [place] Office Visit from 09/29/2018 in [place] Office Visit from 12/16/2017 in [facility] for Infectious Disease  PHQ-2 Total Score 0 0 0 0 1       Assessment and Plan:  49 yo divorced female with a long hx of alcohol use disorder, polysubstance abuse (cocaine, heroin), and past diagnoses of major depressive disorder vs substance-induced mood disorder vs bipolar disorder. Diagnosis of schizophrenia is also mentioned in the record but patient is unable to provide any clear history of psychotic episodes unrelated to her drug/alcohol use. She was first treated for mental problems at age 89 when she became depressed and OD on meds. She has been taking fluoxetine (up to 60 mg) for over 20 years per her report. She also tried buspirone, lorazepam and hydroxyzine for anxiety with no clear benefit. Recently prescribed trazodone for insomnia does not appear to work either. She has never been on any mood stabilizers except for lithium or antipsychotics apart from haloperidol. She did not like how she felt on either of  those. She started abusing alcohol at age of 79. At one time she was drinking a 1/2 gallon of vodka plus some beer on a daily basis. She has a hx of alcohol withdrawal delirium. She has some periods of sobriety lasting few months at a time but cannot provide more details. She has alcohol liver cirrhosis, thrombocytopenia and hepatitis C. She minimizes use of illicit drugs claiming that she only used them when "partying".We have added mirtazapine and she reports that sleep has improved. She continues to complain primarily of severe anxiety both generalized and panic type. She has no support, struggles financially. Danae reports that she has not been drinking alcohol sinceearly April then on last day in May. She has not been taking buspirone or hydroxyzine but reports that clonazepam prescribed recently is very helpful for anxiety and helps her stay away from alcohol? She just attended funeral for a young cousin (38 yo) who has had severe alcohol problem. She has been more depressed and anxious because of that.  YK:DXIPJ anxiety disorder;Mood disorder unspecified (Drug-induced vs bipolar disorder unspecified); Alcohol use disorder severe in early remission  Plan: We will continue fluoxetine 40 mg daily,increase Abilify to 10 mg, continue Remeron 30 mg andclonazepam1 mg prn anxiety (bid).Next appointment in3 months. The plan was discussed with patient who had an opportunity to ask questions and these were all answered. I spend55minutes in phone consultationwith the patient.    Stephanie Acre, MD 03/06/2020, 9:44 AM

## 2020-03-07 MED FILL — FLUoxetine HCL 40 MG CAPS: 40 | 30 days supply | Qty: 30 | Fill #0

## 2020-03-11 ENCOUNTER — Encounter (HOSPITAL_COMMUNITY): Payer: Self-pay

## 2020-03-11 ENCOUNTER — Other Ambulatory Visit: Payer: Self-pay

## 2020-03-11 ENCOUNTER — Emergency Department (HOSPITAL_COMMUNITY)
Admission: EM | Admit: 2020-03-11 | Discharge: 2020-03-12 | Disposition: A | Discharge status: Home / Self Care | Payer: Medicaid Other | Attending: Emergency Medicine | Admitting: Emergency Medicine

## 2020-03-11 DIAGNOSIS — Z5321 Procedure and treatment not carried out due to patient leaving prior to being seen by health care provider: Secondary | ICD-10-CM | POA: Insufficient documentation

## 2020-03-11 DIAGNOSIS — F10129 Alcohol abuse with intoxication, unspecified: Secondary | ICD-10-CM | POA: Diagnosis not present

## 2020-03-11 NOTE — ED Triage Notes (Signed)
Pt sts drinking 6 michelob lights and that she has been bleeding from her ears and eyes.

## 2020-03-12 NOTE — ED Notes (Signed)
Pt eloped from waiting area. Called 3x. VS stable.

## 2020-03-14 ENCOUNTER — Other Ambulatory Visit (HOSPITAL_COMMUNITY): Payer: Self-pay | Admitting: Psychiatry

## 2020-03-14 MED FILL — CLONAZEPAM 1 MG TABS: 1 | 30 days supply | Qty: 60 | Fill #0

## 2020-03-20 ENCOUNTER — Other Ambulatory Visit: Payer: Self-pay | Admitting: Family

## 2020-03-20 ENCOUNTER — Telehealth: Payer: Self-pay | Admitting: Family Medicine

## 2020-03-20 NOTE — Telephone Encounter (Signed)
Returned phone call to patient with no answer. Message left on her voicemail. Will attempt to contact her again tomorrow.

## 2020-03-22 ENCOUNTER — Other Ambulatory Visit: Payer: Self-pay

## 2020-03-22 ENCOUNTER — Telehealth: Payer: Self-pay | Admitting: Family

## 2020-03-22 ENCOUNTER — Inpatient Hospital Stay (HOSPITAL_BASED_OUTPATIENT_CLINIC_OR_DEPARTMENT_OTHER): Payer: Medicaid Other | Admitting: Family

## 2020-03-22 ENCOUNTER — Inpatient Hospital Stay: Payer: Medicaid Other

## 2020-03-22 ENCOUNTER — Encounter: Payer: Self-pay | Admitting: Family

## 2020-03-22 ENCOUNTER — Inpatient Hospital Stay: Payer: Medicaid Other | Attending: Hematology & Oncology

## 2020-03-22 VITALS — BP 106/79 | HR 86 | Temp 98.5°F | Resp 17 | Ht 59.0 in | Wt 142.8 lb

## 2020-03-22 DIAGNOSIS — D696 Thrombocytopenia, unspecified: Secondary | ICD-10-CM | POA: Diagnosis not present

## 2020-03-22 DIAGNOSIS — K7031 Alcoholic cirrhosis of liver with ascites: Secondary | ICD-10-CM

## 2020-03-22 DIAGNOSIS — D693 Immune thrombocytopenic purpura: Secondary | ICD-10-CM | POA: Diagnosis present

## 2020-03-22 LAB — CMP (CANCER CENTER ONLY)
ALT: 31 U/L (ref 0–44)
AST: 76 U/L — ABNORMAL HIGH (ref 15–41)
Albumin: 4.1 g/dL (ref 3.5–5.0)
Alkaline Phosphatase: 208 U/L — ABNORMAL HIGH (ref 38–126)
Anion gap: 12 (ref 5–15)
BUN: 5 mg/dL — ABNORMAL LOW (ref 6–20)
CO2: 22 mmol/L (ref 22–32)
Calcium: 9 mg/dL (ref 8.9–10.3)
Chloride: 106 mmol/L (ref 98–111)
Creatinine: 0.54 mg/dL (ref 0.44–1.00)
GFR, Est AFR Am: >60 mL/min (ref >60)
GFR, Estimated: >60 mL/min (ref >60)
Glucose, Bld: 90 mg/dL (ref 70–99)
Potassium: 3.9 mmol/L (ref 3.5–5.1)
Sodium: 140 mmol/L (ref 135–145)
Total Bilirubin: 2 mg/dL — ABNORMAL HIGH (ref 0.3–1.2)
Total Protein: 7.7 g/dL (ref 6.5–8.1)

## 2020-03-22 LAB — CBC WITH DIFFERENTIAL (CANCER CENTER ONLY)
Abs Immature Granulocytes: 0.01 10*3/uL (ref 0.00–0.07)
Basophils Absolute: 0 10*3/uL (ref 0.0–0.1)
Basophils Relative: 1 %
Eosinophils Absolute: 0.1 10*3/uL (ref 0.0–0.5)
Eosinophils Relative: 3 %
HCT: 41.2 % (ref 36.0–46.0)
Hemoglobin: 13.7 g/dL (ref 12.0–15.0)
Immature Granulocytes: 0 %
Lymphocytes Relative: 34 %
Lymphs Abs: 0.9 10*3/uL (ref 0.7–4.0)
MCH: 30.8 pg (ref 26.0–34.0)
MCHC: 33.3 g/dL (ref 30.0–36.0)
MCV: 92.6 fL (ref 80.0–100.0)
Monocytes Absolute: 0.4 10*3/uL (ref 0.1–1.0)
Monocytes Relative: 16 %
Neutro Abs: 1.2 10*3/uL — ABNORMAL LOW (ref 1.7–7.7)
Neutrophils Relative %: 46 %
Platelet Count: 21 10*3/uL — ABNORMAL LOW (ref 150–400)
RBC: 4.45 MIL/uL (ref 3.87–5.11)
RDW: 18.6 % — ABNORMAL HIGH (ref 11.5–15.5)
WBC Count: 2.7 10*3/uL — ABNORMAL LOW (ref 4.0–10.5)
nRBC: 0 % (ref 0.0–0.2)

## 2020-03-22 LAB — PLATELET BY CITRATE

## 2020-03-22 MED ORDER — ROMIPLOSTIM 250 MCG ~~LOC~~ SOLR
2.0000 ug/kg | Freq: Once | SUBCUTANEOUS | Status: AC
  Administered 2020-03-22: 130 ug via SUBCUTANEOUS
  Filled 2020-03-22: qty 0.26

## 2020-03-22 NOTE — Progress Notes (Signed)
Hematology and Oncology Follow Up Visit  Geraldean Walen [id number] 02-09-1971 49 y.o. 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 Langenberg is here today for follow-up. She has noted fatigue as well as bruising and bleeding around her gums when she brushes her teeth.  She has not noted any other bleeding. No petechiae noted on exam.  Her platelet count today is 21, Hgb 13.7 and WBC count 2.7.  She fell to her knees a few days ago and has abrasions to both knees. No fever, chills, n/v, cough, rash, dizziness, SOB, chest pain, palpitations, abdominal pain or changes in bowel or bladder habits.  No tenderness, numbness or tingling in her extremities. She has chronic swelling in the right great toe since a previous surgery.  She denies any syncopal episodes.  She states that she has maintained a good appetite and is properly hydrating. Her weight is stable at 142 lbs.   ECOG Performance Status: 1 - Symptomatic but completely ambulatory  Medications:  Allergies as of 03/22/2020      Reactions   Other    Platelets: Rx chest pain, tremors, body aches      Medication List       Accurate as of March 22, 2020  8:31 AM. If you have any questions, ask your nurse or doctor.        ARIPiprazole 10 MG tablet Commonly known as: ABILIFY Take 1 tablet (10 mg total) by mouth daily.   clonazePAM 1 MG tablet Commonly known as: KLONOPIN Take 1 tablet (1 mg total) by mouth 2 (two) times daily as needed for anxiety.   FLUoxetine 40 MG capsule Commonly known as: PROZAC Take 1 capsule (40 mg total) by mouth daily.   furosemide 40 MG tablet Commonly known as: Lasix Take 1 tablet (40 mg total) by mouth daily.   gabapentin 300 MG capsule Commonly known as: NEURONTIN Take 2 capsules (600 mg), by mouth, 3 times a  day.   HYDROcodone-acetaminophen 10-325 MG tablet Commonly known as: NORCO Take 1 tablet by mouth every 8 (eight) hours as needed.   hydrOXYzine 25 MG tablet Commonly known as: ATARAX/VISTARIL Take 1 tablet (25 mg total) by mouth every 6 (six) hours as needed for itching.   lactulose 10 GM/15ML solution Commonly known as: CHRONULAC Take 30 mLs (20 g total) by mouth 3 (three) times daily. Goal to have 2-3 BMs daily What changed:   when to take this  reasons to take this   levETIRAcetam 500 MG tablet Commonly known as: Keppra Take 1 tablet (500 mg total) by mouth 2 (two) times daily.   mirtazapine 30 MG tablet Commonly known as: REMERON Take 1 tablet (30 mg total) by mouth at bedtime.   multivitamin with minerals Tabs tablet Take 1 tablet by mouth daily.   polyethylene glycol 17 g packet Commonly known as: MIRALAX / GLYCOLAX Take 17 g by mouth 2 (two) times daily. What changed:   when to take this  reasons to take this   Sofosbuvir-Velpatasvir 400-100 MG Tabs Commonly known as: Epclusa Take 1 tablet by mouth daily. Take 1 tablet by mouth daily.       Allergies:  Allergies  Allergen Reactions  . Other     Platelets: Rx chest pain, tremors, body aches    Past Medical History, Surgical history, Social history, and Family History  were reviewed and updated.  Review of Systems: All other 10 point review of systems is negative.   Physical Exam:  height is 4\' 11"  (1.499 m) and weight is 142 lb 12.8 oz (64.8 kg). Her oral temperature is 98.5 F (36.9 C). Her blood pressure is 106/79 and her pulse is 86. Her respiration is 17 and oxygen saturation is 100%.   Wt Readings from Last 3 Encounters:  03/22/20 142 lb 12.8 oz (64.8 kg)  01/28/20 147 lb (66.7 kg)  01/27/20 144 lb (65.3 kg)    Ocular: Sclerae unicteric, pupils equal, round and reactive to light Ear-nose-throat: Oropharynx clear, dentition fair Lymphatic: No cervical or supraclavicular adenopathy Lungs  no rales or rhonchi, good excursion bilaterally Heart regular rate and rhythm, no murmur appreciated Abd soft, nontender, positive bowel sounds, no liver or spleen tip palpated on exam, no fluid wave  MSK no focal spinal tenderness, no joint edema Neuro: non-focal, well-oriented, appropriate affect Breasts: Deferred   Lab Results  Component Value Date   WBC 2.7 (L) 03/22/2020   HGB 13.7 03/22/2020   HCT 41.2 03/22/2020   MCV 92.6 03/22/2020   PLT 21 (L) 03/22/2020   No results found for: FERRITIN, IRON, TIBC, UIBC, IRONPCTSAT Lab Results  Component Value Date   RBC 4.45 03/22/2020   No results found for: KPAFRELGTCHN, LAMBDASER, KAPLAMBRATIO No results found for: IGGSERUM, IGA, IGMSERUM No results found for: Odetta Pink, SPEI   Chemistry      Component Value Date/Time   NA 140 03/22/2020 0749   NA 142 01/04/2020 1510   K 3.9 03/22/2020 0749   CL 106 03/22/2020 0749   CO2 22 03/22/2020 0749   BUN 5 (L) 03/22/2020 0749   BUN 6 01/04/2020 1510   CREATININE 0.54 03/22/2020 0749   CREATININE 0.61 10/25/2019 0913      Component Value Date/Time   CALCIUM 9.0 03/22/2020 0749   ALKPHOS 208 (H) 03/22/2020 0749   AST 76 (H) 03/22/2020 0749   ALT 31 03/22/2020 0749   BILITOT 2.0 (H) 03/22/2020 0749       Impression and Plan: Ms. 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  Her platelet count today is 21,000. She will get Nplate today and then weekly.  We will plan to see her again for follow-up in 4 weeks.  She will contact our office with any questions or concerns. We can certainly see her sooner if needed.   Laverna Peace, NP 7/14/20218:31 AM

## 2020-03-22 NOTE — Telephone Encounter (Signed)
Appointments scheduled calendar printed per 7/14 los

## 2020-03-22 NOTE — Progress Notes (Signed)
Since last Nplate dose was 04/11/76 at 2 mcg/kg, will proceed with 130 mcg (2 mcg/kg) today.  Hardie Pulley, PharmD, BCPS, BCOP

## 2020-03-22 NOTE — Telephone Encounter (Signed)
Done

## 2020-03-22 NOTE — Patient Instructions (Signed)
Romiplostim injection What is this medicine? ROMIPLOSTIM (roe mi PLOE stim) helps your body make more platelets. This medicine is used to treat low platelets caused by chronic idiopathic thrombocytopenic purpura (ITP). This medicine may be used for other purposes; ask your health care provider or pharmacist if you have questions. COMMON BRAND NAME(S): Nplate What should I tell my health care provider before I take this medicine? They need to know if you have any of these conditions:  bleeding disorders  bone marrow problem, like blood cancer or myelodysplastic syndrome  history of blood clots  liver disease  surgery to remove your spleen  an unusual or allergic reaction to romiplostim, mannitol, other medicines, foods, dyes, or preservatives  pregnant or trying to get pregnant  breast-feeding How should I use this medicine? This medicine is for injection under the skin. It is given by a health care professional in a hospital or clinic setting. A special MedGuide will be given to you before your injection. Read this information carefully each time. Talk to your pediatrician regarding the use of this medicine in children. While this drug may be prescribed for children as young as 1 year for selected conditions, precautions do apply. Overdosage: If you think you have taken too much of this medicine contact a poison control center or emergency room at once. NOTE: This medicine is only for you. Do not share this medicine with others. What if I miss a dose? It is important not to miss your dose. Call your doctor or health care professional if you are unable to keep an appointment. What may interact with this medicine? Interactions are not expected. This list may not describe all possible interactions. Give your health care provider a list of all the medicines, herbs, non-prescription drugs, or dietary supplements you use. Also tell them if you smoke, drink alcohol, or use illegal drugs.  Some items may interact with your medicine. What should I watch for while using this medicine? Your condition will be monitored carefully while you are receiving this medicine. Visit your prescriber or health care professional for regular checks on your progress and for the needed blood tests. It is important to keep all appointments. What side effects may I notice from receiving this medicine? Side effects that you should report to your doctor or health care professional as soon as possible:  allergic reactions like skin rash, itching or hives, swelling of the face, lips, or tongue  signs and symptoms of bleeding such as bloody or black, tarry stools; red or dark brown urine; spitting up blood or brown material that looks like coffee grounds; red spots on the skin; unusual bruising or bleeding from the eyes, gums, or nose  signs and symptoms of a blood clot such as chest pain; shortness of breath; pain, swelling, or warmth in the leg  signs and symptoms of a stroke like changes in vision; confusion; trouble speaking or understanding; severe headaches; sudden numbness or weakness of the face, arm or leg; trouble walking; dizziness; loss of balance or coordination Side effects that usually do not require medical attention (report to your doctor or health care professional if they continue or are bothersome):  headache  pain in arms and legs  pain in mouth  stomach pain This list may not describe all possible side effects. Call your doctor for medical advice about side effects. You may report side effects to FDA at 1-800-FDA-1088. Where should I keep my medicine? This drug is given in a hospital or clinic   and will not be stored at home. NOTE: This sheet is a summary. It may not cover all possible information. If you have questions about this medicine, talk to your doctor, pharmacist, or health care provider.  2020 Elsevier/Gold Standard (2017-08-25 11:10:55)  

## 2020-03-28 ENCOUNTER — Encounter: Payer: Self-pay | Admitting: Family Medicine

## 2020-03-28 ENCOUNTER — Other Ambulatory Visit: Payer: Self-pay

## 2020-03-28 ENCOUNTER — Ambulatory Visit (INDEPENDENT_AMBULATORY_CARE_PROVIDER_SITE_OTHER): Payer: Medicaid Other | Admitting: Family Medicine

## 2020-03-28 ENCOUNTER — Emergency Department (HOSPITAL_COMMUNITY)
Admission: EM | Admit: 2020-03-28 | Discharge: 2020-03-28 | Disposition: A | Discharge status: Home / Self Care | Payer: Medicaid Other | Attending: Emergency Medicine | Admitting: Emergency Medicine

## 2020-03-28 VITALS — BP 101/76 | HR 88 | Temp 98.0°F | Resp 17 | Ht 60.0 in | Wt 141.4 lb

## 2020-03-28 DIAGNOSIS — D696 Thrombocytopenia, unspecified: Secondary | ICD-10-CM

## 2020-03-28 DIAGNOSIS — K920 Hematemesis: Secondary | ICD-10-CM

## 2020-03-28 DIAGNOSIS — Z5321 Procedure and treatment not carried out due to patient leaving prior to being seen by health care provider: Secondary | ICD-10-CM | POA: Diagnosis not present

## 2020-03-28 DIAGNOSIS — M79605 Pain in left leg: Secondary | ICD-10-CM | POA: Insufficient documentation

## 2020-03-28 DIAGNOSIS — R42 Dizziness and giddiness: Secondary | ICD-10-CM | POA: Insufficient documentation

## 2020-03-28 DIAGNOSIS — Z09 Encounter for follow-up examination after completed treatment for conditions other than malignant neoplasm: Secondary | ICD-10-CM

## 2020-03-28 DIAGNOSIS — F1011 Alcohol abuse, in remission: Secondary | ICD-10-CM

## 2020-03-28 DIAGNOSIS — B182 Chronic viral hepatitis C: Secondary | ICD-10-CM | POA: Diagnosis not present

## 2020-03-28 DIAGNOSIS — R040 Epistaxis: Secondary | ICD-10-CM

## 2020-03-28 DIAGNOSIS — Z9181 History of falling: Secondary | ICD-10-CM | POA: Insufficient documentation

## 2020-03-28 DIAGNOSIS — F419 Anxiety disorder, unspecified: Secondary | ICD-10-CM

## 2020-03-28 LAB — BASIC METABOLIC PANEL
Anion gap: 16 — ABNORMAL HIGH (ref 5–15)
BUN: 6 mg/dL (ref 6–20)
CO2: 23 mmol/L (ref 22–32)
Calcium: 8.9 mg/dL (ref 8.9–10.3)
Chloride: 102 mmol/L (ref 98–111)
Creatinine, Ser: 0.59 mg/dL (ref 0.44–1.00)
GFR calc Af Amer: >60 mL/min (ref >60)
GFR calc non Af Amer: >60 mL/min (ref >60)
Glucose, Bld: 85 mg/dL (ref 70–99)
Potassium: 3.8 mmol/L (ref 3.5–5.1)
Sodium: 141 mmol/L (ref 135–145)

## 2020-03-28 LAB — CBG MONITORING, ED: Glucose-Capillary: 68 mg/dL — ABNORMAL LOW (ref 70–99)

## 2020-03-28 LAB — CBC
HCT: 45 % (ref 36.0–46.0)
Hemoglobin: 14.5 g/dL (ref 12.0–15.0)
MCH: 31 pg (ref 26.0–34.0)
MCHC: 32.2 g/dL (ref 30.0–36.0)
MCV: 96.4 fL (ref 80.0–100.0)
Platelets: 30 10*3/uL — ABNORMAL LOW (ref 150–400)
RBC: 4.67 MIL/uL (ref 3.87–5.11)
RDW: 19.5 % — ABNORMAL HIGH (ref 11.5–15.5)
WBC: 4.6 10*3/uL (ref 4.0–10.5)
nRBC: 0 % (ref 0.0–0.2)

## 2020-03-28 LAB — I-STAT BETA HCG BLOOD, ED (MC, WL, AP ONLY): I-stat hCG, quantitative: <5 m[IU]/mL (ref <5)

## 2020-03-28 MED ORDER — SODIUM CHLORIDE 0.9% FLUSH: 3.0000 mL | Freq: Once | INTRAVENOUS | Status: DC

## 2020-03-28 NOTE — ED Notes (Signed)
PT left AMA due to wait

## 2020-03-28 NOTE — Progress Notes (Signed)
Patient [facility]   Established Patient Office Visit  Subjective:  Patient ID: Briana Sweeney, female    DOB: 04/25/1971  Age: 49 y.o. MRN: [id number]  CC:  Chief Complaint  Patient presents with  . Follow-up    Pt states she is here for a f/u in her platelets. Pt states she is brusing and her gums are bleeding reallly bad and leg cramps  in Lleg really bad.X1wk. Pt states she is off balance so she fell about a week ago.    HPI Briana Sweeney is a 49 year old female who presents for Follow Up today.     Patient Active Problem List   Diagnosis Date Noted  . Abnormal urine 12/13/2019  . History of alcohol abuse 09/07/2019  . Insomnia 05/05/2019  . Dizziness 04/02/2019  . Bruising 04/02/2019  . Hyponatremia 04/02/2019  . Head injury   . Scalp laceration, initial encounter   . Fall 01/20/2019  . Epistaxis 01/20/2019  . Altered mental status   . 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 Other mixed anxiety disorders 10/27/2018  . Neuropathy 10/27/2018  . Abscess of bursa of left elbow 09/23/2018  . Olecranon bursitis of left elbow   . Erysipelas 09/13/2018  . Acute metabolic encephalopathy 14/97/0263  . Overdose 08/22/2018  . Hypotension 08/22/2018  . Seizure ([place]) 08/22/2018  . Substance induced mood disorder ([place]) 07/17/2018  . Cocaine dependence without complication ([place]) 78/58/8502  . Polysubstance abuse ([place]) 04/08/2018  . Encephalopathy, 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[place]) 04/08/2018  . Hepatitis C 03/18/2018  . Portal hypertension ([place]) 03/18/2018  . Alcoholic intoxication without complication ([place]) 77/41/2878  . Depression 03/18/2018  . Upper GI bleed 02/10/2018  . Alcohol use disorder, severe, in early remission ([place]) 02/10/2018  . Chronic anemia   . Thrombocytopenia due to drugs   . Suicidal ideation   . Thrombocytopenia ([place]) 01/02/2018  . GERD (gastroesophageal reflux disease) 11/01/2017  . Trichimoniasis  10/30/2017  . Alcohol dependence ([place]) 10/29/2017  . MDD (major depressive disorder), severe ([place]) 10/28/2017  . Acute hyperactive alcohol withdrawal delirium ([place]) 10/09/2017  . Schizophrenia ([place]) 10/09/2017  . Alcohol abuse with alcohol-induced mood disorder ([place])   . Alcohol withdrawal syndrome with complication ([place]) 67/67/2094  . Esophageal varices without bleeding ([place])   . Hematemesis 09/01/2017  . Ascites due to alcoholic cirrhosis ([place])   . Decompensated hepatic cirrhosis ([place]) 07/23/2017  . Alcohol abuse 07/23/2017  . Hepatitis C antibody positive in blood 07/23/2017  . Hypokalemia 07/23/2017  . Jaundice 07/23/2017  . Coagulopathy ([place]) 07/23/2017  . Hypomagnesemia 07/23/2017  . Pancytopenia ([place]) 07/23/2017  . Alcoholic cirrhosis of liver with ascites ([place]) 07/23/2017  . Bacterial vaginosis 06/03/2017  . UTI (urinary tract infection) 06/02/2017      Past Medical History:  Diagnosis Date  . Alcohol use   . Anxiety   . Bipolar affective disorder ([place])    With anxiety features  . Bunionette of right foot 11/2019  . Cirrhosis of liver ([place])    Due to alcohol and hepatitis C  . Depression   . 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 ETOHism ([place])   . GERD (gastroesophageal reflux disease)   . Hematemesis 02/10/2018  . Hepatitis C 2018   hepatitis c and alcohol related hepatitis  . Heroin abuse ([place])   . History of blood transfusion    "blood doesn't clot; I fell down and had to have a transfusion"  . History  of kidney stones   . History of thrombocytopenia   . Menopause 2016  . Migraine    "when I get really stressed" (09/01/2017)  . S/P foot surgery, right 11/2019  . Schizophrenia ([place])   . Seizures ([place])    "when I run out of my RX; lots recently" (09/01/2017)  . Thrombocytopenia ([place])      Current Status: Since her last office visit, she has restarted drinking alcohol. Today, she appears very lethargic today. She was last seen by Oncology and  reports Platelet count at 21 at that time. She did not receive platelet infusion. She arrives to office today with reports of a recent fall, lower extremity bruising, and reports of awaking this morning in 'a pool of blood.' She states that blood was coming from easy bruising, petechiae, gingival bleeding, epistaxis, hematemesis, and nauseated today. She also reports left leg cramps X 3 weeks now, with pain increasing. She reports dizziness and a few falls lately, which she bruised her left leg. She has not had any visual changes, and dizziness. She has an extensive history of Alcoholism. She reports that she last had 6  beers last night. Her appetite is decreased for a few weeks now. She denies fevers, chills, recent infections, weight loss, and night sweats.  No chest pain, heart palpitations, cough and shortness of breath reported. Denies GI problems such as nausea, vomiting, diarrhea, and constipation. Denies depression or anxiety today. She continues to follow up with Psychiatry as needed. She denies suicidal ideations, homicidal ideations, or auditory hallucinations. Shet is taking all medications as prescribed.  Past Surgical History:  Procedure Laterality Date  . ESOPHAGOGASTRODUODENOSCOPY N/A 09/03/2017   Procedure: ESOPHAGOGASTRODUODENOSCOPY (EGD);  Surgeon: Doran Stabler, MD;  Location: [facility];  Service: Gastroenterology;  Laterality: N/A;  . FINGER FRACTURE SURGERY Left    "shattered my pinky"  . FRACTURE SURGERY    . I & D EXTREMITY Left 09/18/2018   Procedure: IRRIGATION AND DEBRIDEMENT EXTREMITY;  Surgeon: Leanora Cover, MD;  Location: [facility];  Service: Orthopedics;  Laterality: Left;  . IR PARACENTESIS  07/23/2017  . IR PARACENTESIS  07/2017   "did it twice in the same week" (09/01/2017)  . SHOULDER OPEN ROTATOR CUFF REPAIR Right   . TOOTH EXTRACTION  08/2019  . TUBAL LIGATION    . VAGINAL HYSTERECTOMY      Family History  Problem Relation Age of Onset  . Lung cancer  Mother 59  . Alcohol abuse Mother   . Throat cancer Father 26    Social History   Socioeconomic History  . Marital status: Divorced    Spouse name: Not on file  . Number of children: 2  . Years of education: Not on file  . Highest education level: Not on file  Occupational History  . Occupation: applying for disability  Tobacco Use  . Smoking status: Never Smoker  . Smokeless tobacco: Never Used  Vaping Use  . Vaping Use: Never used  Substance and Sexual Activity  . Alcohol use: Yes    Alcohol/week: 6.0 standard drinks    Types: 6 Cans of beer per week    Comment:  6 pack per week.  . Drug use: Not Currently    Comment: cocaiane not currently  . Sexual activity: Yes  Other Topics Concern  . Not on file  Social History Narrative   She moved with a boyfriend to [place] and was followed at [facility].  He died of a massive  heart attack in 8/18, per her report, and so she moved back to [place] and is living with a friend.      Right handed   2 story home    Lives alone 11/09/19   Social Determinants of Health   Financial Resource Strain:   . Difficulty of Paying Living Expenses:   Food Insecurity:   . Worried About Charity fundraiser in the Last Year:   . Arboriculturist in the Last Year:   Transportation Needs:   . Film/video editor (Medical):   [name] Kitchen Lack of Transportation (Non-Medical):   Physical Activity:   . Days of Exercise per Week:   . Minutes of Exercise per Session:   Stress:   . Feeling of Stress :   Social Connections:   . Frequency of Communication with Friends and Family:   . Frequency of Social Gatherings with Friends and Family:   . Attends Religious Services:   . Active Member of Clubs or Organizations:   . Attends Archivist Meetings:   [name] Kitchen Marital Status:   Intimate Partner Violence:   . Fear of Current or Ex-Partner:   . Emotionally Abused:   [name] Kitchen Physically Abused:   . Sexually Abused:     Outpatient Medications  Prior to Visit  Medication Sig Dispense Refill  . ARIPiprazole (ABILIFY) 10 MG tablet Take 1 tablet (10 mg total) by mouth daily. 30 tablet 2  . clonazePAM (KLONOPIN) 1 MG tablet Take 1 tablet (1 mg total) by mouth 2 (two) times daily as needed for anxiety. 60 tablet 1  . FLUoxetine (PROZAC) 40 MG capsule Take 1 capsule (40 mg total) by mouth daily. 30 capsule 2  . gabapentin (NEURONTIN) 300 MG capsule Take 2 capsules (600 mg), by mouth, 3 times a day. 180 capsule 3  . lactulose (CHRONULAC) 10 GM/15ML solution Take 30 mLs (20 g total) by mouth 3 (three) times daily. Goal to have 2-3 BMs daily (Patient taking differently: Take 20 g by mouth 3 (three) times daily as needed for moderate constipation. Goal to have 2-3 BMs daily) 240 mL 3  . levETIRAcetam (KEPPRA) 500 MG tablet Take 1 tablet (500 mg total) by mouth 2 (two) times daily. 180 tablet 3  . Multiple Vitamin (MULTIVITAMIN WITH MINERALS) TABS tablet Take 1 tablet by mouth daily.    . polyethylene glycol (MIRALAX / GLYCOLAX) 17 g packet Take 17 g by mouth 2 (two) times daily. (Patient taking differently: Take 17 g by mouth daily as needed for mild constipation. ) 14 each 0  . furosemide (LASIX) 40 MG tablet Take 1 tablet (40 mg total) by mouth daily. (Patient not taking: Reported on 03/28/2020) 30 tablet 7  . HYDROcodone-acetaminophen (NORCO) 10-325 MG tablet Take 1 tablet by mouth every 8 (eight) hours as needed. (Patient not taking: Reported on 01/28/2020) 30 tablet 0  . hydrOXYzine (ATARAX/VISTARIL) 25 MG tablet Take 1 tablet (25 mg total) by mouth every 6 (six) hours as needed for itching. (Patient not taking: Reported on 03/28/2020) 90 tablet 1  . mirtazapine (REMERON) 30 MG tablet Take 1 tablet (30 mg total) by mouth at bedtime. (Patient not taking: Reported on 03/28/2020) 30 tablet 2  . Sofosbuvir-Velpatasvir (EPCLUSA) 400-100 MG TABS Take 1 tablet by mouth daily. Take 1 tablet by mouth daily. (Patient not taking: Reported on 03/28/2020) 28 tablet  5   No facility-administered medications prior to visit.    Allergies  Allergen Reactions  . Other  Platelets: Rx chest pain, tremors, body aches    ROS Review of Systems  Constitutional: Positive for appetite change (decreased) and fatigue (increased).  HENT: Negative.   Eyes: Negative.   Respiratory: Negative.   Cardiovascular: Negative.   Gastrointestinal: Positive for abdominal distention.       Hememesis, nose bleeds, gum bleeds X 1 week.   Endocrine: Negative.   Genitourinary: Negative.   Musculoskeletal: Positive for arthralgias (generalized joint pain; acute left leg pain).  Skin:       Increased bruising.   Allergic/Immunologic: Negative.   Neurological: Positive for dizziness (frequent), seizures (History), weakness (increased) and headaches (frequent).  Hematological: Bruises/bleeds easily (frequent).  Psychiatric/Behavioral: Positive for confusion (mild confusion; increase lethargy).      Objective:    Physical Exam Vitals and nursing note reviewed.  Constitutional:      Appearance: Normal appearance.  HENT:     Head: Normocephalic and atraumatic.     Nose: Nose normal.     Mouth/Throat:     Mouth: Mucous membranes are moist.     Pharynx: Oropharynx is clear.  Cardiovascular:     Rate and Rhythm: Normal rate and regular rhythm.     Pulses: Normal pulses.     Heart sounds: Normal heart sounds.  Pulmonary:     Effort: Pulmonary effort is normal.     Breath sounds: Normal breath sounds.  Abdominal:     General: Bowel sounds are normal.     Palpations: Abdomen is soft.  Musculoskeletal:        General: Normal range of motion.     Cervical back: Normal range of motion and neck supple.  Skin:    General: Skin is warm and dry.     Findings: Bruising present.  Neurological:     General: No focal deficit present.     Mental Status: She is alert and oriented to person, place, and time.  Psychiatric:        Mood and Affect: Mood normal.         Thought Content: Thought content normal.        Judgment: Judgment normal.     BP 101/76 (BP Location: Left Arm, Patient Position: Sitting, Cuff Size: Normal)   Pulse 88   Temp 98 F (36.7 C)   Resp 17   Ht 5' (1.524 m)   Wt 141 lb 6.4 oz (64.1 kg)   SpO2 94%   BMI 27.62 kg/m  Wt Readings from Last 3 Encounters:  03/28/20 141 lb 6.4 oz (64.1 kg)  03/22/20 142 lb 12.8 oz (64.8 kg)  01/28/20 147 lb (66.7 kg)     Health Maintenance Due  Topic Date Due  . TETANUS/TDAP  Never done    There are no preventive care reminders to display for this patient.  Lab Results  Component Value Date   TSH 0.304 (L) 12/16/2019   Lab Results  Component Value Date   WBC 2.7 (L) 03/22/2020   HGB 13.7 03/22/2020   HCT 41.2 03/22/2020   MCV 92.6 03/22/2020   PLT 21 (L) 03/22/2020   Lab Results  Component Value Date   NA 140 03/22/2020   K 3.9 03/22/2020   CO2 22 03/22/2020   GLUCOSE 90 03/22/2020   BUN 5 (L) 03/22/2020   CREATININE 0.54 03/22/2020   BILITOT 2.0 (H) 03/22/2020   ALKPHOS 208 (H) 03/22/2020   AST 76 (H) 03/22/2020   ALT 31 03/22/2020   PROT 7.7 03/22/2020   ALBUMIN 4.1  03/22/2020   CALCIUM 9.0 03/22/2020   ANIONGAP 12 03/22/2020   Lab Results  Component Value Date   CHOL 213 (H) 12/13/2019   Lab Results  Component Value Date   HDL 108 12/13/2019   Lab Results  Component Value Date   LDLCALC 90 12/13/2019   Lab Results  Component Value Date   TRIG 85 12/13/2019   Lab Results  Component Value Date   CHOLHDL 2.0 12/13/2019   Lab Results  Component Value Date   HGBA1C 5.1 12/15/2019    Assessment & Plan:   1. Hospital discharge follow-up  2. History of alcohol abuse  3. Thrombocytopenia ([place]) Her most recent platelet count was at 21 on 03/22/2020. She has bruising has multiple bruises over her today. She continues to deny petechiae, gingival bleeding, and epistaxis. She states that her appetite decreased. Denies abdominal pain, nausea,  vomiting, diarrhea, and constipation. We will transfer her to ED at this time.   4. Chronic hepatitis C without hepatic coma ([place])  5. Hematemesis with nausea  6. Epistaxis  7. Dizziness  8. Left leg pain  9. History of recent fall  10. Anxiety Moderate today.   11. Follow up She will follow up after hospital discharge.   No orders of the defined types were placed in this encounter.   No orders of the defined types were placed in this encounter.   Referral Orders  No referral(s) requested today    Kathe Becton,  MSN, FNP-BC [place] [address] [place], [place] [address] [phone] fax  Problem List Items Addressed This Visit      Digestive   Hematemesis   Hepatitis C     Other   Dizziness   Epistaxis   History of alcohol abuse   Thrombocytopenia ([place])    Other Visit Diagnoses    Hospital discharge follow-up    -  Primary   Left leg pain       History of recent fall       Anxiety       Follow up          No orders of the defined types were placed in this encounter.   Follow-up: No follow-ups on file.    [name], FNP

## 2020-03-28 NOTE — ED Notes (Signed)
Screener informed nurse that pt threw stickers in garbage and left the lobby.

## 2020-03-28 NOTE — ED Triage Notes (Signed)
Per EMS-chronic low platelets-was seen by PCP today-states she gets infusions twice a week-last one last Wednesday-states PCP sent her here because they wont get labs back

## 2020-03-29 ENCOUNTER — Other Ambulatory Visit: Payer: Self-pay | Admitting: Hematology & Oncology

## 2020-03-29 ENCOUNTER — Inpatient Hospital Stay: Payer: Medicaid Other

## 2020-03-29 VITALS — BP 108/73 | HR 77 | Temp 98.2°F | Resp 17

## 2020-03-29 DIAGNOSIS — D693 Immune thrombocytopenic purpura: Secondary | ICD-10-CM | POA: Diagnosis not present

## 2020-03-29 DIAGNOSIS — D696 Thrombocytopenia, unspecified: Secondary | ICD-10-CM

## 2020-03-29 DIAGNOSIS — K7031 Alcoholic cirrhosis of liver with ascites: Secondary | ICD-10-CM

## 2020-03-29 LAB — CBC WITH DIFFERENTIAL (CANCER CENTER ONLY)
Abs Immature Granulocytes: 0 10*3/uL (ref 0.00–0.07)
Basophils Absolute: 0 10*3/uL (ref 0.0–0.1)
Basophils Relative: 1 %
Eosinophils Absolute: 0.1 10*3/uL (ref 0.0–0.5)
Eosinophils Relative: 3 %
HCT: 47 % — ABNORMAL HIGH (ref 36.0–46.0)
Hemoglobin: 15.3 g/dL — ABNORMAL HIGH (ref 12.0–15.0)
Immature Granulocytes: 0 %
Lymphocytes Relative: 28 %
Lymphs Abs: 0.6 10*3/uL — ABNORMAL LOW (ref 0.7–4.0)
MCH: 30.9 pg (ref 26.0–34.0)
MCHC: 32.6 g/dL (ref 30.0–36.0)
MCV: 94.9 fL (ref 80.0–100.0)
Monocytes Absolute: 0.3 10*3/uL (ref 0.1–1.0)
Monocytes Relative: 15 %
Neutro Abs: 1.1 10*3/uL — ABNORMAL LOW (ref 1.7–7.7)
Neutrophils Relative %: 53 %
Platelet Count: 23 10*3/uL — ABNORMAL LOW (ref 150–400)
RBC: 4.95 MIL/uL (ref 3.87–5.11)
RDW: 19.1 % — ABNORMAL HIGH (ref 11.5–15.5)
WBC Count: 2.1 10*3/uL — ABNORMAL LOW (ref 4.0–10.5)
nRBC: 0 % (ref 0.0–0.2)

## 2020-03-29 LAB — CMP (CANCER CENTER ONLY)
ALT: 32 U/L (ref 0–44)
AST: 92 U/L — ABNORMAL HIGH (ref 15–41)
Albumin: 4.1 g/dL (ref 3.5–5.0)
Alkaline Phosphatase: 223 U/L — ABNORMAL HIGH (ref 38–126)
Anion gap: 10 (ref 5–15)
BUN: 7 mg/dL (ref 6–20)
CO2: 27 mmol/L (ref 22–32)
Calcium: 10 mg/dL (ref 8.9–10.3)
Chloride: 100 mmol/L (ref 98–111)
Creatinine: 0.66 mg/dL (ref 0.44–1.00)
GFR, Est AFR Am: >60 mL/min (ref >60)
GFR, Estimated: >60 mL/min (ref >60)
Glucose, Bld: 110 mg/dL — ABNORMAL HIGH (ref 70–99)
Potassium: 3.9 mmol/L (ref 3.5–5.1)
Sodium: 137 mmol/L (ref 135–145)
Total Bilirubin: 2.9 mg/dL — ABNORMAL HIGH (ref 0.3–1.2)
Total Protein: 8.1 g/dL (ref 6.5–8.1)

## 2020-03-29 LAB — PLATELET BY CITRATE

## 2020-03-29 MED ORDER — ROMIPLOSTIM 250 MCG ~~LOC~~ SOLR
3.0000 ug/kg | Freq: Once | SUBCUTANEOUS | Status: AC
  Administered 2020-03-29: 190 ug via SUBCUTANEOUS
  Filled 2020-03-29: qty 0.38

## 2020-03-29 NOTE — Patient Instructions (Signed)
Romiplostim injection What is this medicine? ROMIPLOSTIM (roe mi PLOE stim) helps your body make more platelets. This medicine is used to treat low platelets caused by chronic idiopathic thrombocytopenic purpura (ITP). This medicine may be used for other purposes; ask your health care provider or pharmacist if you have questions. COMMON BRAND NAME(S): Nplate What should I tell my health care provider before I take this medicine? They need to know if you have any of these conditions:  bleeding disorders  bone marrow problem, like blood cancer or myelodysplastic syndrome  history of blood clots  liver disease  surgery to remove your spleen  an unusual or allergic reaction to romiplostim, mannitol, other medicines, foods, dyes, or preservatives  pregnant or trying to get pregnant  breast-feeding How should I use this medicine? This medicine is for injection under the skin. It is given by a health care professional in a hospital or clinic setting. A special MedGuide will be given to you before your injection. Read this information carefully each time. Talk to your pediatrician regarding the use of this medicine in children. While this drug may be prescribed for children as young as 1 year for selected conditions, precautions do apply. Overdosage: If you think you have taken too much of this medicine contact a poison control center or emergency room at once. NOTE: This medicine is only for you. Do not share this medicine with others. What if I miss a dose? It is important not to miss your dose. Call your doctor or health care professional if you are unable to keep an appointment. What may interact with this medicine? Interactions are not expected. This list may not describe all possible interactions. Give your health care provider a list of all the medicines, herbs, non-prescription drugs, or dietary supplements you use. Also tell them if you smoke, drink alcohol, or use illegal drugs.  Some items may interact with your medicine. What should I watch for while using this medicine? Your condition will be monitored carefully while you are receiving this medicine. Visit your prescriber or health care professional for regular checks on your progress and for the needed blood tests. It is important to keep all appointments. What side effects may I notice from receiving this medicine? Side effects that you should report to your doctor or health care professional as soon as possible:  allergic reactions like skin rash, itching or hives, swelling of the face, lips, or tongue  signs and symptoms of bleeding such as bloody or black, tarry stools; red or dark brown urine; spitting up blood or brown material that looks like coffee grounds; red spots on the skin; unusual bruising or bleeding from the eyes, gums, or nose  signs and symptoms of a blood clot such as chest pain; shortness of breath; pain, swelling, or warmth in the leg  signs and symptoms of a stroke like changes in vision; confusion; trouble speaking or understanding; severe headaches; sudden numbness or weakness of the face, arm or leg; trouble walking; dizziness; loss of balance or coordination Side effects that usually do not require medical attention (report to your doctor or health care professional if they continue or are bothersome):  headache  pain in arms and legs  pain in mouth  stomach pain This list may not describe all possible side effects. Call your doctor for medical advice about side effects. You may report side effects to FDA at 1-800-FDA-1088. Where should I keep my medicine? This drug is given in a hospital or clinic   and will not be stored at home. NOTE: This sheet is a summary. It may not cover all possible information. If you have questions about this medicine, talk to your doctor, pharmacist, or health care provider.  2020 Elsevier/Gold Standard (2017-08-25 11:10:55)  

## 2020-04-04 ENCOUNTER — Ambulatory Visit: Payer: Self-pay | Admitting: Family Medicine

## 2020-04-05 ENCOUNTER — Inpatient Hospital Stay: Payer: Medicaid Other

## 2020-04-05 ENCOUNTER — Other Ambulatory Visit: Payer: Self-pay

## 2020-04-05 DIAGNOSIS — K7031 Alcoholic cirrhosis of liver with ascites: Secondary | ICD-10-CM

## 2020-04-05 DIAGNOSIS — D693 Immune thrombocytopenic purpura: Secondary | ICD-10-CM | POA: Diagnosis not present

## 2020-04-05 DIAGNOSIS — D696 Thrombocytopenia, unspecified: Secondary | ICD-10-CM

## 2020-04-05 LAB — CMP (CANCER CENTER ONLY)
ALT: 29 U/L (ref 0–44)
AST: 70 U/L — ABNORMAL HIGH (ref 15–41)
Albumin: 4.2 g/dL (ref 3.5–5.0)
Alkaline Phosphatase: 228 U/L — ABNORMAL HIGH (ref 38–126)
Anion gap: 12 (ref 5–15)
BUN: 3 mg/dL — ABNORMAL LOW (ref 6–20)
CO2: 23 mmol/L (ref 22–32)
Calcium: 9.5 mg/dL (ref 8.9–10.3)
Chloride: 105 mmol/L (ref 98–111)
Creatinine: 0.53 mg/dL (ref 0.44–1.00)
GFR, Est AFR Am: >60 mL/min (ref >60)
GFR, Estimated: >60 mL/min (ref >60)
Glucose, Bld: 96 mg/dL (ref 70–99)
Potassium: 3.8 mmol/L (ref 3.5–5.1)
Sodium: 140 mmol/L (ref 135–145)
Total Bilirubin: 1.3 mg/dL — ABNORMAL HIGH (ref 0.3–1.2)
Total Protein: 8 g/dL (ref 6.5–8.1)

## 2020-04-05 LAB — CBC WITH DIFFERENTIAL (CANCER CENTER ONLY)
Abs Immature Granulocytes: 0 10*3/uL (ref 0.00–0.07)
Basophils Absolute: 0 10*3/uL (ref 0.0–0.1)
Basophils Relative: 1 %
Eosinophils Absolute: 0 10*3/uL (ref 0.0–0.5)
Eosinophils Relative: 1 %
HCT: 42.7 % (ref 36.0–46.0)
Hemoglobin: 14.4 g/dL (ref 12.0–15.0)
Immature Granulocytes: 0 %
Lymphocytes Relative: 21 %
Lymphs Abs: 0.6 10*3/uL — ABNORMAL LOW (ref 0.7–4.0)
MCH: 30.9 pg (ref 26.0–34.0)
MCHC: 33.7 g/dL (ref 30.0–36.0)
MCV: 91.6 fL (ref 80.0–100.0)
Monocytes Absolute: 0.5 10*3/uL (ref 0.1–1.0)
Monocytes Relative: 17 %
Neutro Abs: 1.8 10*3/uL (ref 1.7–7.7)
Neutrophils Relative %: 60 %
Platelet Count: 91 10*3/uL — ABNORMAL LOW (ref 150–400)
RBC: 4.66 MIL/uL (ref 3.87–5.11)
RDW: 18.8 % — ABNORMAL HIGH (ref 11.5–15.5)
WBC Count: 3 10*3/uL — ABNORMAL LOW (ref 4.0–10.5)
nRBC: 0 % (ref 0.0–0.2)

## 2020-04-05 LAB — PLATELET BY CITRATE

## 2020-04-05 NOTE — Progress Notes (Signed)
Reviewed pt labs with Dr. Marin Olp (CBC) and pt does not need Nplate injection. Reviewed labs with patient and pt aware no need for injection. Pt stated she has had some bleeding around her gums. Reviewed symptoms with Dr. Marin Olp and still no need for injection. Pt aware to call clinic with any questions or concerns; verbalized understanding and had no further questions. Pt given calendar for next appointments. Pt left clinic ambulatory in no apparent distress.

## 2020-04-11 MED FILL — ARIPIPRAZOLE 10 MG TABS: 10 | 30 days supply | Qty: 30 | Fill #1

## 2020-04-12 ENCOUNTER — Other Ambulatory Visit: Payer: Self-pay

## 2020-04-12 ENCOUNTER — Telehealth: Payer: Self-pay | Admitting: Hematology & Oncology

## 2020-04-12 ENCOUNTER — Inpatient Hospital Stay: Payer: Medicaid Other | Attending: Hematology & Oncology

## 2020-04-12 ENCOUNTER — Encounter: Payer: Self-pay | Admitting: *Deleted

## 2020-04-12 DIAGNOSIS — K7031 Alcoholic cirrhosis of liver with ascites: Secondary | ICD-10-CM

## 2020-04-12 DIAGNOSIS — D693 Immune thrombocytopenic purpura: Secondary | ICD-10-CM | POA: Diagnosis not present

## 2020-04-12 DIAGNOSIS — D696 Thrombocytopenia, unspecified: Secondary | ICD-10-CM

## 2020-04-12 LAB — CMP (CANCER CENTER ONLY)
ALT: 32 U/L (ref 0–44)
AST: 95 U/L — ABNORMAL HIGH (ref 15–41)
Albumin: 3.7 g/dL (ref 3.5–5.0)
Alkaline Phosphatase: 226 U/L — ABNORMAL HIGH (ref 38–126)
Anion gap: 10 (ref 5–15)
BUN: 5 mg/dL — ABNORMAL LOW (ref 6–20)
CO2: 25 mmol/L (ref 22–32)
Calcium: 9.3 mg/dL (ref 8.9–10.3)
Chloride: 105 mmol/L (ref 98–111)
Creatinine: 0.54 mg/dL (ref 0.44–1.00)
GFR, Est AFR Am: >60 mL/min (ref >60)
GFR, Estimated: >60 mL/min (ref >60)
Glucose, Bld: 83 mg/dL (ref 70–99)
Potassium: 4 mmol/L (ref 3.5–5.1)
Sodium: 140 mmol/L (ref 135–145)
Total Bilirubin: 1.1 mg/dL (ref 0.3–1.2)
Total Protein: 7.3 g/dL (ref 6.5–8.1)

## 2020-04-12 LAB — CBC WITH DIFFERENTIAL (CANCER CENTER ONLY)
Abs Immature Granulocytes: 0 10*3/uL (ref 0.00–0.07)
Basophils Absolute: 0 10*3/uL (ref 0.0–0.1)
Basophils Relative: 1 %
Eosinophils Absolute: 0.1 10*3/uL (ref 0.0–0.5)
Eosinophils Relative: 5 %
HCT: 42.3 % (ref 36.0–46.0)
Hemoglobin: 14.2 g/dL (ref 12.0–15.0)
Immature Granulocytes: 0 %
Lymphocytes Relative: 35 %
Lymphs Abs: 0.7 10*3/uL (ref 0.7–4.0)
MCH: 31.6 pg (ref 26.0–34.0)
MCHC: 33.6 g/dL (ref 30.0–36.0)
MCV: 94 fL (ref 80.0–100.0)
Monocytes Absolute: 0.3 10*3/uL (ref 0.1–1.0)
Monocytes Relative: 14 %
Neutro Abs: 1 10*3/uL — ABNORMAL LOW (ref 1.7–7.7)
Neutrophils Relative %: 45 %
Platelet Count: 67 10*3/uL — ABNORMAL LOW (ref 150–400)
RBC: 4.5 MIL/uL (ref 3.87–5.11)
RDW: 18.6 % — ABNORMAL HIGH (ref 11.5–15.5)
WBC Count: 2.1 10*3/uL — ABNORMAL LOW (ref 4.0–10.5)
nRBC: 0 % (ref 0.0–0.2)

## 2020-04-12 LAB — PLATELET BY CITRATE

## 2020-04-12 NOTE — Telephone Encounter (Signed)
Called and LMVm for patient regarding appointment needed for injection.  I asked that she call me back directly in order to schedule.

## 2020-04-13 ENCOUNTER — Inpatient Hospital Stay: Payer: Medicaid Other

## 2020-04-13 VITALS — BP 138/87 | HR 59 | Temp 98.6°F | Resp 18

## 2020-04-13 DIAGNOSIS — D696 Thrombocytopenia, unspecified: Secondary | ICD-10-CM

## 2020-04-13 DIAGNOSIS — D693 Immune thrombocytopenic purpura: Secondary | ICD-10-CM | POA: Diagnosis not present

## 2020-04-13 MED ORDER — ROMIPLOSTIM 250 MCG ~~LOC~~ SOLR
190.0000 ug | Freq: Once | SUBCUTANEOUS | Status: AC
  Administered 2020-04-13: 190 ug via SUBCUTANEOUS
  Filled 2020-04-13: qty 0.38

## 2020-04-13 MED FILL — FLUoxetine HCL 40 MG CAPS: 40 | 30 days supply | Qty: 30 | Fill #1

## 2020-04-13 MED FILL — CLONAZEPAM 1 MG TABS: 1 | 30 days supply | Qty: 60 | Fill #1

## 2020-04-13 NOTE — Progress Notes (Signed)
Patient in for Nplate today due to Platelet count, she complains of bleeding gums and nose bleeds and is unable to eat because of her gums. Discussed with Dr.Ennever who recommends Vitamin K 167mcg over the counter. Informed patient who verbalized understanding and will pick up from pharmacy.

## 2020-04-13 NOTE — Patient Instructions (Signed)
Romiplostim injection What is this medicine? ROMIPLOSTIM (roe mi PLOE stim) helps your body make more platelets. This medicine is used to treat low platelets caused by chronic idiopathic thrombocytopenic purpura (ITP). This medicine may be used for other purposes; ask your health care provider or pharmacist if you have questions. COMMON BRAND NAME(S): Nplate What should I tell my health care provider before I take this medicine? They need to know if you have any of these conditions:  bleeding disorders  bone marrow problem, like blood cancer or myelodysplastic syndrome  history of blood clots  liver disease  surgery to remove your spleen  an unusual or allergic reaction to romiplostim, mannitol, other medicines, foods, dyes, or preservatives  pregnant or trying to get pregnant  breast-feeding How should I use this medicine? This medicine is for injection under the skin. It is given by a health care professional in a hospital or clinic setting. A special MedGuide will be given to you before your injection. Read this information carefully each time. Talk to your pediatrician regarding the use of this medicine in children. While this drug may be prescribed for children as young as 1 year for selected conditions, precautions do apply. Overdosage: If you think you have taken too much of this medicine contact a poison control center or emergency room at once. NOTE: This medicine is only for you. Do not share this medicine with others. What if I miss a dose? It is important not to miss your dose. Call your doctor or health care professional if you are unable to keep an appointment. What may interact with this medicine? Interactions are not expected. This list may not describe all possible interactions. Give your health care provider a list of all the medicines, herbs, non-prescription drugs, or dietary supplements you use. Also tell them if you smoke, drink alcohol, or use illegal drugs.  Some items may interact with your medicine. What should I watch for while using this medicine? Your condition will be monitored carefully while you are receiving this medicine. Visit your prescriber or health care professional for regular checks on your progress and for the needed blood tests. It is important to keep all appointments. What side effects may I notice from receiving this medicine? Side effects that you should report to your doctor or health care professional as soon as possible:  allergic reactions like skin rash, itching or hives, swelling of the face, lips, or tongue  signs and symptoms of bleeding such as bloody or black, tarry stools; red or dark brown urine; spitting up blood or brown material that looks like coffee grounds; red spots on the skin; unusual bruising or bleeding from the eyes, gums, or nose  signs and symptoms of a blood clot such as chest pain; shortness of breath; pain, swelling, or warmth in the leg  signs and symptoms of a stroke like changes in vision; confusion; trouble speaking or understanding; severe headaches; sudden numbness or weakness of the face, arm or leg; trouble walking; dizziness; loss of balance or coordination Side effects that usually do not require medical attention (report to your doctor or health care professional if they continue or are bothersome):  headache  pain in arms and legs  pain in mouth  stomach pain This list may not describe all possible side effects. Call your doctor for medical advice about side effects. You may report side effects to FDA at 1-800-FDA-1088. Where should I keep my medicine? This drug is given in a hospital or clinic   and will not be stored at home. NOTE: This sheet is a summary. It may not cover all possible information. If you have questions about this medicine, talk to your doctor, pharmacist, or health care provider.  2020 Elsevier/Gold Standard (2017-08-25 11:10:55)  

## 2020-04-19 ENCOUNTER — Telehealth: Payer: Self-pay | Admitting: Family

## 2020-04-19 ENCOUNTER — Inpatient Hospital Stay: Payer: Medicaid Other

## 2020-04-19 ENCOUNTER — Inpatient Hospital Stay (HOSPITAL_BASED_OUTPATIENT_CLINIC_OR_DEPARTMENT_OTHER): Payer: Medicaid Other | Admitting: Family

## 2020-04-19 ENCOUNTER — Other Ambulatory Visit: Payer: Self-pay

## 2020-04-19 ENCOUNTER — Encounter: Payer: Self-pay | Admitting: Family

## 2020-04-19 VITALS — BP 133/92 | HR 72 | Temp 98.0°F | Resp 18 | Ht 60.0 in | Wt 148.1 lb

## 2020-04-19 DIAGNOSIS — D696 Thrombocytopenia, unspecified: Secondary | ICD-10-CM

## 2020-04-19 DIAGNOSIS — D693 Immune thrombocytopenic purpura: Secondary | ICD-10-CM | POA: Diagnosis not present

## 2020-04-19 DIAGNOSIS — D61818 Other pancytopenia: Secondary | ICD-10-CM

## 2020-04-19 DIAGNOSIS — K7031 Alcoholic cirrhosis of liver with ascites: Secondary | ICD-10-CM

## 2020-04-19 LAB — CBC WITH DIFFERENTIAL (CANCER CENTER ONLY)
Abs Immature Granulocytes: 0.01 10*3/uL (ref 0.00–0.07)
Basophils Absolute: 0 10*3/uL (ref 0.0–0.1)
Basophils Relative: 1 %
Eosinophils Absolute: 0.2 10*3/uL (ref 0.0–0.5)
Eosinophils Relative: 6 %
HCT: 42 % (ref 36.0–46.0)
Hemoglobin: 14 g/dL (ref 12.0–15.0)
Immature Granulocytes: 0 %
Lymphocytes Relative: 25 %
Lymphs Abs: 0.7 10*3/uL (ref 0.7–4.0)
MCH: 31.6 pg (ref 26.0–34.0)
MCHC: 33.3 g/dL (ref 30.0–36.0)
MCV: 94.8 fL (ref 80.0–100.0)
Monocytes Absolute: 0.5 10*3/uL (ref 0.1–1.0)
Monocytes Relative: 20 %
Neutro Abs: 1.3 10*3/uL — ABNORMAL LOW (ref 1.7–7.7)
Neutrophils Relative %: 48 %
Platelet Count: 36 10*3/uL — ABNORMAL LOW (ref 150–400)
RBC: 4.43 MIL/uL (ref 3.87–5.11)
RDW: 17.8 % — ABNORMAL HIGH (ref 11.5–15.5)
WBC Count: 2.7 10*3/uL — ABNORMAL LOW (ref 4.0–10.5)
nRBC: 0 % (ref 0.0–0.2)

## 2020-04-19 LAB — CMP (CANCER CENTER ONLY)
ALT: 19 U/L (ref 0–44)
AST: 43 U/L — ABNORMAL HIGH (ref 15–41)
Albumin: 3.6 g/dL (ref 3.5–5.0)
Alkaline Phosphatase: 206 U/L — ABNORMAL HIGH (ref 38–126)
Anion gap: 7 (ref 5–15)
BUN: 8 mg/dL (ref 6–20)
CO2: 26 mmol/L (ref 22–32)
Calcium: 9.4 mg/dL (ref 8.9–10.3)
Chloride: 104 mmol/L (ref 98–111)
Creatinine: 0.58 mg/dL (ref 0.44–1.00)
GFR, Est AFR Am: >60 mL/min (ref >60)
GFR, Estimated: >60 mL/min (ref >60)
Glucose, Bld: 96 mg/dL (ref 70–99)
Potassium: 3.6 mmol/L (ref 3.5–5.1)
Sodium: 137 mmol/L (ref 135–145)
Total Bilirubin: 0.9 mg/dL (ref 0.3–1.2)
Total Protein: 6.9 g/dL (ref 6.5–8.1)

## 2020-04-19 LAB — PLATELET BY CITRATE

## 2020-04-19 MED ORDER — ROMIPLOSTIM INJECTION 500 MCG
4.0000 ug/kg | Freq: Once | SUBCUTANEOUS | Status: AC
  Administered 2020-04-19: 270 ug via SUBCUTANEOUS
  Filled 2020-04-19: qty 0.54

## 2020-04-19 NOTE — Patient Instructions (Signed)
Romiplostim injection What is this medicine? ROMIPLOSTIM (roe mi PLOE stim) helps your body make more platelets. This medicine is used to treat low platelets caused by chronic idiopathic thrombocytopenic purpura (ITP). This medicine may be used for other purposes; ask your health care provider or pharmacist if you have questions. COMMON BRAND NAME(S): Nplate What should I tell my health care provider before I take this medicine? They need to know if you have any of these conditions:  bleeding disorders  bone marrow problem, like blood cancer or myelodysplastic syndrome  history of blood clots  liver disease  surgery to remove your spleen  an unusual or allergic reaction to romiplostim, mannitol, other medicines, foods, dyes, or preservatives  pregnant or trying to get pregnant  breast-feeding How should I use this medicine? This medicine is for injection under the skin. It is given by a health care professional in a hospital or clinic setting. A special MedGuide will be given to you before your injection. Read this information carefully each time. Talk to your pediatrician regarding the use of this medicine in children. While this drug may be prescribed for children as young as 1 year for selected conditions, precautions do apply. Overdosage: If you think you have taken too much of this medicine contact a poison control center or emergency room at once. NOTE: This medicine is only for you. Do not share this medicine with others. What if I miss a dose? It is important not to miss your dose. Call your doctor or health care professional if you are unable to keep an appointment. What may interact with this medicine? Interactions are not expected. This list may not describe all possible interactions. Give your health care provider a list of all the medicines, herbs, non-prescription drugs, or dietary supplements you use. Also tell them if you smoke, drink alcohol, or use illegal drugs.  Some items may interact with your medicine. What should I watch for while using this medicine? Your condition will be monitored carefully while you are receiving this medicine. Visit your prescriber or health care professional for regular checks on your progress and for the needed blood tests. It is important to keep all appointments. What side effects may I notice from receiving this medicine? Side effects that you should report to your doctor or health care professional as soon as possible:  allergic reactions like skin rash, itching or hives, swelling of the face, lips, or tongue  signs and symptoms of bleeding such as bloody or black, tarry stools; red or dark brown urine; spitting up blood or brown material that looks like coffee grounds; red spots on the skin; unusual bruising or bleeding from the eyes, gums, or nose  signs and symptoms of a blood clot such as chest pain; shortness of breath; pain, swelling, or warmth in the leg  signs and symptoms of a stroke like changes in vision; confusion; trouble speaking or understanding; severe headaches; sudden numbness or weakness of the face, arm or leg; trouble walking; dizziness; loss of balance or coordination Side effects that usually do not require medical attention (report to your doctor or health care professional if they continue or are bothersome):  headache  pain in arms and legs  pain in mouth  stomach pain This list may not describe all possible side effects. Call your doctor for medical advice about side effects. You may report side effects to FDA at 1-800-FDA-1088. Where should I keep my medicine? This drug is given in a hospital or clinic   and will not be stored at home. NOTE: This sheet is a summary. It may not cover all possible information. If you have questions about this medicine, talk to your doctor, pharmacist, or health care provider.  2020 Elsevier/Gold Standard (2017-08-25 11:10:55)  

## 2020-04-19 NOTE — Progress Notes (Signed)
Hematology and Oncology Follow Up Visit  Briana Sweeney [id number] 08-20-1971 49 y.o. 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 She had a severe "pinching" headache yesterday for 5 minutes. This was her first episode and has not had any issues since. She felt dizzy at the time as well. If this happens again and becomes more frequent she will contact her PCP or go to the ED in the event of an emergency.  She states that she is resting well at night.  Her platelet count today is 36, Hgb 14.0 and WBC count 2.7.  She notes a little blood from her nose at times as well as her gums when she brushes her teeth. These are brief and she has not noted any other blood loss. No bruising or petechiae.  No fever, chills, n/v, cough, rash, SOB, chest pain or changes in bowel or bladder habits. She has occasional palpitations.  She also notes occasional left flank pain she feels may be related to her kidney. No pain on exam today.  No swelling, tenderness, numbness or tingling in her extremities.  No falls or syncopal episodes to report.  She denies alcohol use at this time.  She has a good appetite and is staying well hydrated. Her weight is stable.   ECOG Performance Status: 1 - Symptomatic but completely ambulatory  Medications:  Allergies as of 04/19/2020      Reactions   Other Other (See Comments)   Platelets: Rx chest pain, tremors, body aches      Medication List       Accurate as of April 19, 2020  9:07 AM. If you have any questions, ask your nurse or doctor.        ARIPiprazole 10 MG tablet Commonly known as: ABILIFY Take 1 tablet (10 mg total) by mouth daily.   clonazePAM 1 MG tablet Commonly known as: KLONOPIN Take 1 tablet (1 mg total) by mouth 2 (two) times daily as needed for anxiety.     FLUoxetine 40 MG capsule Commonly known as: PROZAC Take 1 capsule (40 mg total) by mouth daily.   furosemide 40 MG tablet Commonly known as: Lasix Take 1 tablet (40 mg total) by mouth daily.   gabapentin 300 MG capsule Commonly known as: NEURONTIN Take 2 capsules (600 mg), by mouth, 3 times a day.   HYDROcodone-acetaminophen 10-325 MG tablet Commonly known as: NORCO Take 1 tablet by mouth every 8 (eight) hours as needed.   hydrOXYzine 25 MG tablet Commonly known as: ATARAX/VISTARIL Take 1 tablet (25 mg total) by mouth every 6 (six) hours as needed for itching.   lactulose 10 GM/15ML solution Commonly known as: CHRONULAC Take 30 mLs (20 g total) by mouth 3 (three) times daily. Goal to have 2-3 BMs daily   levETIRAcetam 500 MG tablet Commonly known as: Keppra Take 1 tablet (500 mg total) by mouth 2 (two) times daily.   mirtazapine 30 MG tablet Commonly known as: REMERON Take 1 tablet (30 mg total) by mouth at bedtime.   multivitamin with minerals Tabs tablet Take 1 tablet by mouth daily.   polyethylene glycol 17 g packet Commonly known as: MIRALAX / GLYCOLAX Take 17 g by mouth 2 (two) times daily. What changed:   when to take this  reasons to take this       Allergies:  Allergies  Allergen Reactions   Other Other (See Comments)    Platelets: Rx chest pain, tremors, body aches    Past Medical History, Surgical history, Social history, and Family History were reviewed and updated.  Review of Systems: All other 10 point review of systems is negative.   Physical Exam:  height is 5' (1.524 m) and weight is 148 lb 1.9 oz (67.2 kg). Her oral temperature is 98 F (36.7 C). Her blood pressure is 133/92 (abnormal) and her pulse is 72. Her respiration is 18 and oxygen saturation is 99%.   Wt Readings from Last 3 Encounters:  04/19/20 148 lb 1.9 oz (67.2 kg)  03/28/20 141 lb 6.4 oz (64.1 kg)  03/22/20 142 lb 12.8 oz (64.8 kg)    Ocular: Sclerae unicteric,  pupils equal, round and reactive to light Ear-nose-throat: Oropharynx clear, dentition fair Lymphatic: No cervical or supraclavicular adenopathy Lungs no rales or rhonchi, good excursion bilaterally Heart regular rate and rhythm, no murmur appreciated Abd soft, nontender, positive bowel sounds, no liver or spleen tip palpated on exam, no fluid wave  MSK no focal spinal tenderness, no joint edema Neuro: non-focal, well-oriented, appropriate affect Breasts: Deferred   Lab Results  Component Value Date   WBC 2.7 (L) 04/19/2020   HGB 14.0 04/19/2020   HCT 42.0 04/19/2020   MCV 94.8 04/19/2020   PLT 36 (L) 04/19/2020   No results found for: FERRITIN, IRON, TIBC, UIBC, IRONPCTSAT Lab Results  Component Value Date   RBC 4.43 04/19/2020   No results found for: KPAFRELGTCHN, LAMBDASER, KAPLAMBRATIO No results found for: IGGSERUM, IGA, IGMSERUM No results found for: Odetta Pink, SPEI   Chemistry      Component Value Date/Time   NA 137 04/19/2020 0816   NA 142 01/04/2020 1510   K 3.6 04/19/2020 0816   CL 104 04/19/2020 0816   CO2 26 04/19/2020 0816   BUN 8 04/19/2020 0816   BUN 6 01/04/2020 1510   CREATININE 0.58 04/19/2020 0816   CREATININE 0.61 10/25/2019 0913      Component Value Date/Time   CALCIUM 9.4 04/19/2020 0816   ALKPHOS 206 (H) 04/19/2020 0816   AST 43 (H) 04/19/2020 0816   ALT 19 04/19/2020 0816   BILITOT 0.9 04/19/2020 0816       Impression and Plan: Briana Sweeney is a very pleasant 49 yo caucasian female with 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  Platelets today are 36,000. We will proceed with Nplate and keep her on her weekly schedule.  We will see her again in another month.  She can contact our office with any questions or concerns and go to the ED in the event of an emergency.    Briana Peace, NP 8/11/20219:07 AM

## 2020-04-19 NOTE — Telephone Encounter (Signed)
Appointments scheduled calendar printed & given to patient per 8/11 los

## 2020-04-25 ENCOUNTER — Other Ambulatory Visit: Payer: Self-pay

## 2020-04-25 ENCOUNTER — Emergency Department (HOSPITAL_COMMUNITY): Payer: Medicaid Other

## 2020-04-25 ENCOUNTER — Emergency Department (HOSPITAL_COMMUNITY)
Admission: EM | Admit: 2020-04-25 | Discharge: 2020-04-25 | Disposition: A | Discharge status: Home / Self Care | Payer: Medicaid Other | Attending: Emergency Medicine | Admitting: Emergency Medicine

## 2020-04-25 ENCOUNTER — Encounter (HOSPITAL_COMMUNITY): Payer: Self-pay | Admitting: Emergency Medicine

## 2020-04-25 DIAGNOSIS — K766 Portal hypertension: Secondary | ICD-10-CM | POA: Insufficient documentation

## 2020-04-25 DIAGNOSIS — W108XXA Fall (on) (from) other stairs and steps, initial encounter: Secondary | ICD-10-CM | POA: Diagnosis not present

## 2020-04-25 DIAGNOSIS — Z79899 Other long term (current) drug therapy: Secondary | ICD-10-CM | POA: Diagnosis not present

## 2020-04-25 DIAGNOSIS — S62310A Displaced fracture of base of second metacarpal bone, right hand, initial encounter for closed fracture: Secondary | ICD-10-CM | POA: Insufficient documentation

## 2020-04-25 DIAGNOSIS — S60921A Unspecified superficial injury of right hand, initial encounter: Secondary | ICD-10-CM | POA: Diagnosis present

## 2020-04-25 DIAGNOSIS — Y939 Activity, unspecified: Secondary | ICD-10-CM | POA: Diagnosis not present

## 2020-04-25 DIAGNOSIS — Y929 Unspecified place or not applicable: Secondary | ICD-10-CM | POA: Diagnosis not present

## 2020-04-25 DIAGNOSIS — Y999 Unspecified external cause status: Secondary | ICD-10-CM | POA: Diagnosis not present

## 2020-04-25 LAB — COMPREHENSIVE METABOLIC PANEL
ALT: 28 U/L (ref 0–44)
AST: 56 U/L — ABNORMAL HIGH (ref 15–41)
Albumin: 3.7 g/dL (ref 3.5–5.0)
Alkaline Phosphatase: 156 U/L — ABNORMAL HIGH (ref 38–126)
Anion gap: 14 (ref 5–15)
BUN: 5 mg/dL — ABNORMAL LOW (ref 6–20)
CO2: 21 mmol/L — ABNORMAL LOW (ref 22–32)
Calcium: 8.7 mg/dL — ABNORMAL LOW (ref 8.9–10.3)
Chloride: 103 mmol/L (ref 98–111)
Creatinine, Ser: 0.6 mg/dL (ref 0.44–1.00)
GFR calc Af Amer: >60 mL/min (ref >60)
GFR calc non Af Amer: >60 mL/min (ref >60)
Glucose, Bld: 103 mg/dL — ABNORMAL HIGH (ref 70–99)
Potassium: 4 mmol/L (ref 3.5–5.1)
Sodium: 138 mmol/L (ref 135–145)
Total Bilirubin: 1.5 mg/dL — ABNORMAL HIGH (ref 0.3–1.2)
Total Protein: 7.8 g/dL (ref 6.5–8.1)

## 2020-04-25 LAB — CBC WITH DIFFERENTIAL/PLATELET
Abs Immature Granulocytes: 0.01 10*3/uL (ref 0.00–0.07)
Basophils Absolute: 0 10*3/uL (ref 0.0–0.1)
Basophils Relative: 1 %
Eosinophils Absolute: 0.1 10*3/uL (ref 0.0–0.5)
Eosinophils Relative: 1 %
HCT: 47.2 % — ABNORMAL HIGH (ref 36.0–46.0)
Hemoglobin: 15 g/dL (ref 12.0–15.0)
Immature Granulocytes: 0 %
Lymphocytes Relative: 9 %
Lymphs Abs: 0.4 10*3/uL — ABNORMAL LOW (ref 0.7–4.0)
MCH: 31.4 pg (ref 26.0–34.0)
MCHC: 31.8 g/dL (ref 30.0–36.0)
MCV: 99 fL (ref 80.0–100.0)
Monocytes Absolute: 0.7 10*3/uL (ref 0.1–1.0)
Monocytes Relative: 16 %
Neutro Abs: 3.1 10*3/uL (ref 1.7–7.7)
Neutrophils Relative %: 73 %
Platelets: 107 10*3/uL — ABNORMAL LOW (ref 150–400)
RBC: 4.77 MIL/uL (ref 3.87–5.11)
RDW: 17.9 % — ABNORMAL HIGH (ref 11.5–15.5)
WBC: 4.2 10*3/uL (ref 4.0–10.5)
nRBC: 0 % (ref 0.0–0.2)

## 2020-04-25 MED ORDER — OXYCODONE-ACETAMINOPHEN 5-325 MG PO TABS
1.0000 | ORAL_TABLET | Freq: Once | ORAL | Status: AC
  Administered 2020-04-25: 1 via ORAL
  Filled 2020-04-25: qty 1

## 2020-04-25 MED ORDER — ACETAMINOPHEN 500 MG PO TABS
500.0000 mg | ORAL_TABLET | Freq: Once | ORAL | Status: AC
  Administered 2020-04-25: 500 mg via ORAL
  Filled 2020-04-25: qty 1

## 2020-04-25 NOTE — Discharge Instructions (Addendum)
Please call the office of Dr. Amedeo Plenty, hand surgery, to schedule an appointment for ongoing evaluation and management of your 2nd metacarpal fracture.  Please continue to wear the splint. Keep the hand elevated and take tylenol for symptoms of pain.    I would also like for you to review the local resources available for substance use disorder given your drinking.  Return to the ED or seek immediate medical attention should you experience any new or worsening symptoms.

## 2020-04-25 NOTE — ED Triage Notes (Signed)
Patient reports falling downstairs last night, had metal object in her hand that braced her hand during fall. States pain is 10/10, hand is swollen and bruised. Patient reports drinking alcohol last night. Denies blood thinners. Denies hitting head or LOC

## 2020-04-25 NOTE — ED Notes (Signed)
Ortho tech paged for splint.

## 2020-04-25 NOTE — Progress Notes (Signed)
Orthopedic Tech Progress Note Patient Details:  Briana Sweeney March 25, 1971 [id number]  Ortho Devices Type of Ortho Device: Ace wrap, Rad Gutter splint Ortho Device/Splint Location: RUE Ortho Device/Splint Interventions: Ordered, Application   Post Interventions Patient Tolerated: Well Instructions Provided: Care of device   Braulio Bosch 04/25/2020, 7:50 PM

## 2020-04-25 NOTE — ED Provider Notes (Addendum)
[facility] DEPT Provider Note   CSN: [id number] Arrival date & time: 04/25/20  1643     History Chief Complaint  Patient presents with  . Hand Injury    Briana Sweeney is a 49 y.o. female with PMH significant for cirrhosis, bipolar affective disorder, polysubstance abuse, withdrawal seizures, and thrombocytopenia who presents to the ED for possible hand fracture.  Patient reports that she was drinking yesterday and had approximately 15 drinks.  She was walking down the stairs carrying a metal component a wheelchair when she tripped and fell at approximately 9 PM, landing on her right hand.  On my examination, she has a significantly swollen right hand and she is exhibiting tremors.  She suspects that her tremoring is due to alcohol withdrawal symptoms.  She denies any head injury or LOC, headache or dizziness, neck pain, difficulty breathing or chest pain, back or abdominal pain, nausea or vomiting, numbness or weakness, or other injury.  She has not taken anything for her pain symptoms.  HPI     Past Medical History:  Diagnosis Date  . Alcohol use   . Anxiety   . Bipolar affective disorder ([place])    With anxiety features  . Bunionette of right foot 11/2019  . Cirrhosis of liver ([place])    Due to alcohol and hepatitis C  . Depression   . 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 ETOHism ([place])   . GERD (gastroesophageal reflux disease)   . Hematemesis 02/10/2018  . Hepatitis C 2018   hepatitis c and alcohol related hepatitis  . Heroin abuse ([place])   . History of blood transfusion    "blood doesn't clot; I fell down and had to have a transfusion"  . History of kidney stones   . History of thrombocytopenia   . Menopause 2016  . Migraine    "when I get really stressed" (09/01/2017)  . S/P foot surgery, right 11/2019  . Schizophrenia ([place])   . Seizures ([place])    "when I run out of my RX; lots recently" (09/01/2017)  . Thrombocytopenia [facility])      Patient Active Problem List   Diagnosis Date Noted  . Left leg pain 03/28/2020  . History of recent fall 03/28/2020  . Abnormal urine 12/13/2019  . History of alcohol abuse 09/07/2019  . Insomnia 05/05/2019  . Dizziness 04/02/2019  . Bruising 04/02/2019  . Hyponatremia 04/02/2019  . Head injury   . Scalp laceration, initial encounter   . Fall 01/20/2019  . Epistaxis 01/20/2019  . Altered mental status   . 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 Other mixed anxiety disorders 10/27/2018  . Neuropathy 10/27/2018  . Abscess of bursa of left elbow 09/23/2018  . Olecranon bursitis of left elbow   . Erysipelas 09/13/2018  . Acute metabolic encephalopathy 40/98/1191  . Overdose 08/22/2018  . Hypotension 08/22/2018  . Seizure ([place]) 08/22/2018  . Substance induced mood disorder ([place]) 07/17/2018  . Cocaine dependence without complication ([place]) 47/82/9562  . Polysubstance abuse ([place]) 04/08/2018  . Encephalopathy, 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[place]) 04/08/2018  . Hepatitis C 03/18/2018  . Portal hypertension ([place]) 03/18/2018  . Alcoholic intoxication without complication ([place]) 13/04/6577  . Depression 03/18/2018  . Upper GI bleed 02/10/2018  . Alcohol use disorder, severe, in early remission ([place]) 02/10/2018  . Chronic anemia   . Thrombocytopenia due to drugs   . Suicidal ideation   . Thrombocytopenia ([place]) 01/02/2018  . GERD (gastroesophageal reflux disease) 11/01/2017  . Trichimoniasis  10/30/2017  . Alcohol dependence ([place]) 10/29/2017  . MDD (major depressive disorder), severe ([place]) 10/28/2017  . Acute hyperactive alcohol withdrawal delirium ([place]) 10/09/2017  . Schizophrenia ([place]) 10/09/2017  . Alcohol abuse with alcohol-induced mood disorder ([place])   . Alcohol withdrawal syndrome with complication ([place]) 87/86/7672  . Esophageal varices without bleeding ([place])   . Hematemesis 09/01/2017  . Ascites due to alcoholic cirrhosis ([place])   . Decompensated hepatic cirrhosis ([place]) 07/23/2017   . Alcohol abuse 07/23/2017  . Hepatitis C antibody positive in blood 07/23/2017  . Hypokalemia 07/23/2017  . Jaundice 07/23/2017  . Coagulopathy ([place]) 07/23/2017  . Hypomagnesemia 07/23/2017  . Pancytopenia ([place]) 07/23/2017  . Alcoholic cirrhosis of liver with ascites ([place]) 07/23/2017  . Bacterial vaginosis 06/03/2017  . UTI (urinary tract infection) 06/02/2017    Past Surgical History:  Procedure Laterality Date  . ESOPHAGOGASTRODUODENOSCOPY N/A 09/03/2017   Procedure: ESOPHAGOGASTRODUODENOSCOPY (EGD);  Surgeon: Doran Stabler, MD;  Location: [facility];  Service: Gastroenterology;  Laterality: N/A;  . FINGER FRACTURE SURGERY Left    "shattered my pinky"  . FRACTURE SURGERY    . I & D EXTREMITY Left 09/18/2018   Procedure: IRRIGATION AND DEBRIDEMENT EXTREMITY;  Surgeon: Leanora Cover, MD;  Location: [facility];  Service: Orthopedics;  Laterality: Left;  . IR PARACENTESIS  07/23/2017  . IR PARACENTESIS  07/2017   "did it twice in the same week" (09/01/2017)  . SHOULDER OPEN ROTATOR CUFF REPAIR Right   . TOOTH EXTRACTION  08/2019  . TUBAL LIGATION    . VAGINAL HYSTERECTOMY       OB History    Gravida  3   Para      Term      Preterm      AB      Living  2     SAB      TAB      Ectopic      Multiple      Live Births              Family History  Problem Relation Age of Onset  . Lung cancer Mother 3  . Alcohol abuse Mother   . Throat cancer Father 46    Social History   Tobacco Use  . Smoking status: Never Smoker  . Smokeless tobacco: Never Used  Vaping Use  . Vaping Use: Never used  Substance Use Topics  . Alcohol use: Yes    Alcohol/week: 6.0 standard drinks    Types: 6 Cans of beer per week    Comment:  6 pack per week.  . Drug use: Not Currently    Comment: cocaiane not currently    Home Medications Prior to Admission medications   Medication Sig Start Date End Date Taking? Authorizing Provider  ARIPiprazole (ABILIFY) 10 MG  tablet Take 1 tablet (10 mg total) by mouth daily. 03/06/20 06/04/20  Pucilowski, Marchia Bond, MD  clonazePAM (KLONOPIN) 1 MG tablet Take 1 tablet (1 mg total) by mouth 2 (two) times daily as needed for anxiety. 03/14/20 05/13/20  Pucilowski, Marchia Bond, MD  FLUoxetine (PROZAC) 40 MG capsule Take 1 capsule (40 mg total) by mouth daily. 03/06/20 06/04/20  Pucilowski, Marchia Bond, MD  furosemide (LASIX) 40 MG tablet Take 1 tablet (40 mg total) by mouth daily. 12/13/19 08/09/20  [name], FNP  gabapentin (NEURONTIN) 300 MG capsule Take 2 capsules (600 mg), by mouth, 3 times a day. 01/04/20   [name], FNP  HYDROcodone-acetaminophen (  NORCO) 10-325 MG tablet Take 1 tablet by mouth every 8 (eight) hours as needed. 01/04/20   [name], FNP  hydrOXYzine (ATARAX/VISTARIL) 25 MG tablet Take 1 tablet (25 mg total) by mouth every 6 (six) hours as needed for itching. 12/13/19   [name], FNP  lactulose (CHRONULAC) 10 GM/15ML solution Take 30 mLs (20 g total) by mouth 3 (three) times daily. Goal to have 2-3 BMs daily Patient not taking: Reported on 04/19/2020 04/13/19   Hosie Poisson, MD  levETIRAcetam (KEPPRA) 500 MG tablet Take 1 tablet (500 mg total) by mouth 2 (two) times daily. 11/09/19   Cameron Sprang, MD  mirtazapine (REMERON) 30 MG tablet Take 1 tablet (30 mg total) by mouth at bedtime. Patient not taking: Reported on 03/28/2020 03/06/20 06/04/20  Pucilowski, Marchia Bond, MD  Multiple Vitamin (MULTIVITAMIN WITH MINERALS) TABS tablet Take 1 tablet by mouth daily. 04/13/19   Hosie Poisson, MD  polyethylene glycol (MIRALAX / GLYCOLAX) 17 g packet Take 17 g by mouth 2 (two) times daily. Patient taking differently: Take 17 g by mouth daily as needed for mild constipation.  04/13/19   Hosie Poisson, MD    Allergies    Other  Review of Systems   Review of Systems  All other systems reviewed and are negative.   Physical Exam Updated Vital Signs BP 125/88 (BP Location: Right Arm)   Pulse 89   Temp  98.9 F (37.2 C) (Oral)   Resp 18   Ht 5' (1.524 m)   Wt 67 kg   SpO2 97%   BMI 28.85 kg/m   Physical Exam Vitals and nursing note reviewed. Exam conducted with a chaperone present.  Constitutional:      Appearance: Normal appearance. She is not diaphoretic.  HENT:     Head: Normocephalic and atraumatic.  Eyes:     General: No scleral icterus.    Conjunctiva/sclera: Conjunctivae normal.  Pulmonary:     Effort: Pulmonary effort is normal.  Musculoskeletal:     Comments: Right hand: Significant TTP diffusely over metacarpals.  Swelling on dorsum and palmar aspect of hand.  Significant ecchymoses involving thenar eminence and moderate ecchymoses dorsally over metacarpals.  Assessed radial, median, and ulnar nerves - sensation intact throughout.  Radial pulse intact and strong.  Able to assess capillary refill and fifth finger and intact less than 2 seconds.  Swollen, but soft compartments.   Right wrist: Radial pulse intact.  Able to flex and extend, albeit mildly diminished.  No bony TTP. Right elbow: ROM is normal.  No significant bony TTP or overlying skin changes. Right shoulder/clavicle: ROM normal.  No bony TTP or overlying skin changes. No midline cervical, thoracic, lumbar, or sacral TTP.  Ambulatory.  Skin:    General: Skin is dry.  Neurological:     Mental Status: She is alert.     GCS: GCS eye subscore is 4. GCS verbal subscore is 5. GCS motor subscore is 6.  Psychiatric:        Mood and Affect: Mood normal.        Behavior: Behavior normal.        Thought Content: Thought content normal.     Comments: No DTs or decreased LOC.  Denies AVH.  Tremors noted.  No agitation.      ED Results / Procedures / Treatments   Labs (all labs ordered are listed, but only abnormal results are displayed) Labs Reviewed  CBC WITH DIFFERENTIAL/PLATELET - Abnormal; Notable for the  following components:      Result Value   HCT 47.2 (*)    RDW 17.9 (*)    Platelets 107 (*)    Lymphs  Abs 0.4 (*)    All other components within normal limits  COMPREHENSIVE METABOLIC PANEL - Abnormal; Notable for the following components:   CO2 21 (*)    Glucose, Bld 103 (*)    BUN 5 (*)    Calcium 8.7 (*)    AST 56 (*)    Alkaline Phosphatase 156 (*)    Total Bilirubin 1.5 (*)    All other components within normal limits    EKG None  Radiology DG Hand Complete Right  Result Date: 04/25/2020 CLINICAL DATA:  Right hand pain after fall, swelling and bruising EXAM: RIGHT HAND - COMPLETE 3+ VIEW COMPARISON:  None. FINDINGS: Frontal, oblique, and lateral views of the right hand are obtained. There is an impacted extra-articular fracture at the base of the second metacarpal. Alignment is near anatomic. No other acute bony abnormalities. There is diffuse soft tissue edema most pronounced over the dorsum of the hand. Mild diffuse osteoarthritis greatest at the fifth proximal interphalangeal joint. IMPRESSION: 1. Impacted minimally displaced fracture at the base of the second metacarpal. 2. Diffuse soft tissue edema. Electronically Signed   By: Randa Ngo M.D.   On: 04/25/2020 18:24    Procedures Procedures (including critical care time)  Medications Ordered in ED Medications  acetaminophen (TYLENOL) tablet 500 mg (500 mg Oral Given 04/25/20 1731)  oxyCODONE-acetaminophen (PERCOCET/ROXICET) 5-325 MG per tablet 1 tablet (1 tablet Oral Given 04/25/20 1847)    ED Course  I have reviewed the triage vital signs and the nursing notes.  Pertinent labs & imaging results that were available during my care of the patient were reviewed by me and considered in my medical decision making (see chart for details).  Clinical Course as of Apr 27 27  Tue Apr 25, 2020  1922 49 yo female here with right 2nd proximal metacarpal fracture after a fall yesterday, struck her hand on a railing.  Bruising and swelling of the thenar eminance and ttp of base of 2nd finger.  No other acute injuries noted.  Xrays  reviewed.  Plan for casting and f/u with hand surgery. She is right-handed, advised to not use this hand until cleared by hand surgeon.   [MT]    Clinical Course User Index [MT] Wyvonnia Dusky, MD   MDM Rules/Calculators/A&P                          Patient had platelet count of 36 on most recent CBC obtained on 07/29/2020.  Patient is at high risk for bleeding events which makes her fall down a set of stairs particularly high risk.  She denies any chest or back pain.  She also denies any headache or dizziness.  Her primary complaint is her hand pain and swelling which is pronounced on exam.  Will obtain basic laboratory work-up and imaging of right hand for further evaluation.  Liver enzymes on recent CMP were relatively reassuring despite history of cirrhosis.  Do not give NSAIDs for discomfort given her history of bleeds, nor do want to provide her with narcotic medications given her history of polysubstance abuse.  Patient is understanding and agreeable to plan.  Patient's laboratory work-up is very reassuring.  Her thrombocytopenia has improved compared to recent labs, although still mildly low at 1.7.  Remainder  of laboratory work-up is consistent with or improved from baseline.  Plain films obtained of right hand demonstrate an impacted minimally displaced fracture at the base of the second metacarpal with associated diffuse soft tissue edema.  These findings are consistent with her physical exam.  She did not take off her ring which can be visualized.    No DT's and patient does not appear to be in acute alcohol withdrawal.  Will place in radial gutter splint and refer to hand surgery for further evaluation and management.  Patient was personally evaluated by Dr. Langston Masker who agrees with assessment and plan.  Removed patient's ring on affected hand using ring cutter.  All of the evaluation and work-up results were discussed with the patient and any family at bedside.  Patient and/or family  were informed that while patient is appropriate for discharge at this time, some medical emergencies may only develop or become detectable after a period of time.  I specifically instructed patient and/or family to return to return to the ED or seek immediate medical attention for any new or worsening symptoms.  They were provided opportunity to ask any additional questions and have none at this time.  Prior to discharge patient is feeling well, agreeable with plan for discharge home.  They have expressed understanding of verbal discharge instructions as well as return precautions and are agreeable to the plan.   SPLINT APPLICATION Date/Time: 07:86 AM Authorized by: Wyvonnia Dusky Consent: Verbal consent obtained. Risks and benefits: risks, benefits and alternatives were discussed Consent given by: patient Splint applied by: orthopedic technician Location details: right hand Splint type: rad gutter Post-procedure: The splinted body part was neurovascularly unchanged following the procedure. Patient tolerance: Patient tolerated the procedure well with no immediate complications.   Final Clinical Impression(s) / ED Diagnoses Final diagnoses:  Closed displaced fracture of base of second metacarpal bone of right hand, initial encounter    Rx / DC Orders ED Discharge Orders    None       Corena Herter, PA-C 04/25/20 1933    Corena Herter, PA-C 04/25/20 2025    Wyvonnia Dusky, MD 04/26/20 [id number]    Wyvonnia Dusky, MD 04/26/20 0028

## 2020-04-26 ENCOUNTER — Inpatient Hospital Stay: Payer: Medicaid Other

## 2020-04-26 VITALS — BP 148/100 | HR 80 | Resp 17

## 2020-04-26 DIAGNOSIS — D696 Thrombocytopenia, unspecified: Secondary | ICD-10-CM

## 2020-04-26 DIAGNOSIS — D693 Immune thrombocytopenic purpura: Secondary | ICD-10-CM | POA: Diagnosis not present

## 2020-04-26 DIAGNOSIS — D61818 Other pancytopenia: Secondary | ICD-10-CM

## 2020-04-26 LAB — CMP (CANCER CENTER ONLY)
ALT: 21 U/L (ref 0–44)
AST: 43 U/L — ABNORMAL HIGH (ref 15–41)
Albumin: 3.8 g/dL (ref 3.5–5.0)
Alkaline Phosphatase: 201 U/L — ABNORMAL HIGH (ref 38–126)
Anion gap: 9 (ref 5–15)
BUN: 11 mg/dL (ref 6–20)
CO2: 24 mmol/L (ref 22–32)
Calcium: 9.3 mg/dL (ref 8.9–10.3)
Chloride: 104 mmol/L (ref 98–111)
Creatinine: 0.71 mg/dL (ref 0.44–1.00)
GFR, Est AFR Am: >60 mL/min (ref >60)
GFR, Estimated: >60 mL/min (ref >60)
Glucose, Bld: 98 mg/dL (ref 70–99)
Potassium: 3.8 mmol/L (ref 3.5–5.1)
Sodium: 137 mmol/L (ref 135–145)
Total Bilirubin: 1.4 mg/dL — ABNORMAL HIGH (ref 0.3–1.2)
Total Protein: 7.7 g/dL (ref 6.5–8.1)

## 2020-04-26 LAB — CBC WITH DIFFERENTIAL (CANCER CENTER ONLY)
Abs Immature Granulocytes: 0 10*3/uL (ref 0.00–0.07)
Basophils Absolute: 0 10*3/uL (ref 0.0–0.1)
Basophils Relative: 1 %
Eosinophils Absolute: 0.2 10*3/uL (ref 0.0–0.5)
Eosinophils Relative: 6 %
HCT: 43.6 % (ref 36.0–46.0)
Hemoglobin: 14.8 g/dL (ref 12.0–15.0)
Immature Granulocytes: 0 %
Lymphocytes Relative: 21 %
Lymphs Abs: 0.7 10*3/uL (ref 0.7–4.0)
MCH: 31.6 pg (ref 26.0–34.0)
MCHC: 33.9 g/dL (ref 30.0–36.0)
MCV: 93.2 fL (ref 80.0–100.0)
Monocytes Absolute: 0.5 10*3/uL (ref 0.1–1.0)
Monocytes Relative: 17 %
Neutro Abs: 1.8 10*3/uL (ref 1.7–7.7)
Neutrophils Relative %: 55 %
Platelet Count: 108 10*3/uL — ABNORMAL LOW (ref 150–400)
RBC: 4.68 MIL/uL (ref 3.87–5.11)
RDW: 17.4 % — ABNORMAL HIGH (ref 11.5–15.5)
WBC Count: 3.2 10*3/uL — ABNORMAL LOW (ref 4.0–10.5)
nRBC: 0 % (ref 0.0–0.2)

## 2020-04-26 LAB — PLATELET BY CITRATE

## 2020-04-26 MED ORDER — ROMIPLOSTIM 250 MCG ~~LOC~~ SOLR: 2.0000 ug/kg | Freq: Once | SUBCUTANEOUS | Status: DC

## 2020-04-26 MED FILL — oxyCODONE HCL 5 MG TABS: 5 | 7 days supply | Qty: 42 | Fill #0

## 2020-04-26 NOTE — Progress Notes (Signed)
We will hold NPlate today per Dr. Marin Olp. Pltc = 108.

## 2020-05-01 ENCOUNTER — Other Ambulatory Visit: Payer: Self-pay

## 2020-05-01 ENCOUNTER — Other Ambulatory Visit: Payer: Medicaid Other

## 2020-05-01 DIAGNOSIS — B1710 Acute hepatitis C without hepatic coma: Secondary | ICD-10-CM

## 2020-05-02 MED FILL — oxyCODONE HCL 5 MG TABS: 5 | 7 days supply | Qty: 42 | Fill #0

## 2020-05-03 ENCOUNTER — Inpatient Hospital Stay: Payer: Medicaid Other

## 2020-05-03 ENCOUNTER — Other Ambulatory Visit: Payer: Self-pay

## 2020-05-03 DIAGNOSIS — D696 Thrombocytopenia, unspecified: Secondary | ICD-10-CM

## 2020-05-03 DIAGNOSIS — D693 Immune thrombocytopenic purpura: Secondary | ICD-10-CM | POA: Diagnosis not present

## 2020-05-03 DIAGNOSIS — D61818 Other pancytopenia: Secondary | ICD-10-CM

## 2020-05-03 LAB — CBC WITH DIFFERENTIAL (CANCER CENTER ONLY)
Abs Immature Granulocytes: 0.01 10*3/uL (ref 0.00–0.07)
Basophils Absolute: 0 10*3/uL (ref 0.0–0.1)
Basophils Relative: 1 %
Eosinophils Absolute: 0.2 10*3/uL (ref 0.0–0.5)
Eosinophils Relative: 5 %
HCT: 42.2 % (ref 36.0–46.0)
Hemoglobin: 14.4 g/dL (ref 12.0–15.0)
Immature Granulocytes: 0 %
Lymphocytes Relative: 25 %
Lymphs Abs: 1.1 10*3/uL (ref 0.7–4.0)
MCH: 31.7 pg (ref 26.0–34.0)
MCHC: 34.1 g/dL (ref 30.0–36.0)
MCV: 93 fL (ref 80.0–100.0)
Monocytes Absolute: 0.5 10*3/uL (ref 0.1–1.0)
Monocytes Relative: 12 %
Neutro Abs: 2.5 10*3/uL (ref 1.7–7.7)
Neutrophils Relative %: 57 %
Platelet Count: 205 10*3/uL (ref 150–400)
RBC: 4.54 MIL/uL (ref 3.87–5.11)
RDW: 16.6 % — ABNORMAL HIGH (ref 11.5–15.5)
WBC Count: 4.4 10*3/uL (ref 4.0–10.5)
nRBC: 0 % (ref 0.0–0.2)

## 2020-05-03 LAB — CBC
HCT: 42.8 % (ref 35.0–45.0)
Hemoglobin: 14.4 g/dL (ref 11.7–15.5)
MCH: 31.7 pg (ref 27.0–33.0)
MCHC: 33.6 g/dL (ref 32.0–36.0)
MCV: 94.3 fL (ref 80.0–100.0)
MPV: 11.9 fL (ref 7.5–12.5)
Platelets: 173 10*3/uL (ref 140–400)
RBC: 4.54 10*6/uL (ref 3.80–5.10)
RDW: 16.2 % — ABNORMAL HIGH (ref 11.0–15.0)
WBC: 3.6 10*3/uL — ABNORMAL LOW (ref 3.8–10.8)

## 2020-05-03 LAB — COMPREHENSIVE METABOLIC PANEL
AG Ratio: 1 (calc) (ref 1.0–2.5)
ALT: 16 U/L (ref 6–29)
AST: 31 U/L (ref 10–35)
Albumin: 3.4 g/dL — ABNORMAL LOW (ref 3.6–5.1)
Alkaline phosphatase (APISO): 181 U/L — ABNORMAL HIGH (ref 31–125)
BUN/Creatinine Ratio: 11 (calc) (ref 6–22)
BUN: 6 mg/dL — ABNORMAL LOW (ref 7–25)
CO2: 25 mmol/L (ref 20–32)
Calcium: 9.1 mg/dL (ref 8.6–10.2)
Chloride: 105 mmol/L (ref 98–110)
Creat: 0.54 mg/dL (ref 0.50–1.10)
Globulin: 3.3 g/dL (calc) (ref 1.9–3.7)
Glucose, Bld: 92 mg/dL (ref 65–99)
Potassium: 4.1 mmol/L (ref 3.5–5.3)
Sodium: 136 mmol/L (ref 135–146)
Total Bilirubin: 1 mg/dL (ref 0.2–1.2)
Total Protein: 6.7 g/dL (ref 6.1–8.1)

## 2020-05-03 LAB — CMP (CANCER CENTER ONLY)
ALT: 15 U/L (ref 0–44)
AST: 37 U/L (ref 15–41)
Albumin: 3.7 g/dL (ref 3.5–5.0)
Alkaline Phosphatase: 156 U/L — ABNORMAL HIGH (ref 38–126)
Anion gap: 7 (ref 5–15)
BUN: 5 mg/dL — ABNORMAL LOW (ref 6–20)
CO2: 24 mmol/L (ref 22–32)
Calcium: 8.8 mg/dL — ABNORMAL LOW (ref 8.9–10.3)
Chloride: 105 mmol/L (ref 98–111)
Creatinine: 0.67 mg/dL (ref 0.44–1.00)
GFR, Est AFR Am: >60 mL/min (ref >60)
GFR, Estimated: >60 mL/min (ref >60)
Glucose, Bld: 93 mg/dL (ref 70–99)
Potassium: 4.5 mmol/L (ref 3.5–5.1)
Sodium: 136 mmol/L (ref 135–145)
Total Bilirubin: 1 mg/dL (ref 0.3–1.2)
Total Protein: 7.4 g/dL (ref 6.5–8.1)

## 2020-05-03 LAB — PLATELET BY CITRATE

## 2020-05-03 LAB — HEPATITIS C RNA QUANTITATIVE
HCV RNA, PCR, QN (Log): <1.18 log IU/mL
HCV RNA, PCR, QN: <15 IU/mL

## 2020-05-03 NOTE — Progress Notes (Signed)
No treatment today per Judson Roch, NP d/t platelets 205. dph

## 2020-05-05 ENCOUNTER — Encounter: Payer: Self-pay | Admitting: Family Medicine

## 2020-05-05 MED FILL — GABAPENTIN 300 MG CAPSULE: 300 | 30 days supply | Qty: 180 | Fill #2

## 2020-05-05 MED FILL — ARIPIPRAZOLE 10 MG TABS: 10 | 30 days supply | Qty: 30 | Fill #2

## 2020-05-08 ENCOUNTER — Other Ambulatory Visit (HOSPITAL_COMMUNITY): Payer: Self-pay | Admitting: Psychiatry

## 2020-05-08 ENCOUNTER — Telehealth (INDEPENDENT_AMBULATORY_CARE_PROVIDER_SITE_OTHER): Payer: Medicaid Other | Admitting: Psychiatry

## 2020-05-08 ENCOUNTER — Other Ambulatory Visit: Payer: Self-pay

## 2020-05-08 DIAGNOSIS — F413 Other mixed anxiety disorders: Secondary | ICD-10-CM | POA: Diagnosis not present

## 2020-05-08 DIAGNOSIS — F419 Anxiety disorder, unspecified: Secondary | ICD-10-CM | POA: Diagnosis not present

## 2020-05-08 DIAGNOSIS — F1021 Alcohol dependence, in remission: Secondary | ICD-10-CM

## 2020-05-08 MED ORDER — ARIPIPRAZOLE 10 MG PO TABS: 10.0000 mg | ORAL_TABLET | Freq: Every day | ORAL | 2 refills | Status: DC

## 2020-05-08 MED ORDER — MIRTAZAPINE 30 MG PO TABS: 30.0000 mg | ORAL_TABLET | Freq: Every day | ORAL | 2 refills | Status: DC

## 2020-05-08 MED ORDER — CLONAZEPAM 1 MG PO TABS
1.0000 mg | ORAL_TABLET | Freq: Two times a day (BID) | ORAL | 2 refills | Status: DC | PRN
Start: 2020-05-13 — End: 2020-06-15

## 2020-05-08 MED ORDER — FLUOXETINE HCL 40 MG PO CAPS: 40.0000 mg | ORAL_CAPSULE | Freq: Every day | ORAL | 2 refills | Status: DC

## 2020-05-08 MED FILL — FLUoxetine HCL 40 MG CAPS: 40 | 30 days supply | Qty: 30 | Fill #0

## 2020-05-08 MED FILL — MIRTAZAPINE 30 MG TABLET: 30 | 30 days supply | Qty: 30 | Fill #0

## 2020-05-08 NOTE — Progress Notes (Signed)
BH MD/PA/NP OP Progress Note  05/08/2020 9:39 AM Briana Sweeney  MRN:  [id number] Interview was conducted by phone  and I verified that I was speaking with the correct person using two identifiers. I discussed the limitations of evaluation and management by telemedicine and  the availability of in person appointments. Patient expressed understanding and agreed to proceed. Patient location - home; physician - home office.  Chief Complaint: Anxiety.  HPI: 49 yo divorced female with a long hx of alcohol use disorder, polysubstance abuse (cocaine, heroin), and past diagnoses of major depressive disorder vs substance-induced mood disorder vs bipolar disorder. Diagnosis of schizophrenia is also mentioned in the record but patient is unable to provide any clear history of psychotic episodes unrelated to her drug/alcohol use. She was first treated for mental problems at age 13 when she became depressed and OD on meds. She has been taking fluoxetine (up to 60 mg) for over 20 years per her report. She also tried buspirone, lorazepamand hydroxyzine for anxiety with no clear benefit. Recently prescribed trazodone for insomnia does not appear to work either. She has never been on any mood stabilizers except for lithium or antipsychotics apart from haloperidol. She did not like how she felt on either of those. She started abusing alcohol at age of 49. At one time she was drinking a 1/2 gallon of vodka plus some beer on a daily basis. She has a hx of alcohol withdrawal delirium. She has some periods of sobriety lasting few months at a time but cannot provide more details. She has alcohol liver cirrhosis, thrombocytopenia andhepatitis C. Last platelet count 205. She minimizes use of illicit drugs in the past claiming that she only usedthem when "partying".We have added mirtazapine and she reports that sleep has improved. She continues to complain primarily of severe anxiety both generalized and panic type. She has no  support, struggles financially. Briana Sweeney reports that she has not been drinking alcohol last two months. She has not been taking buspirone or hydroxyzine but reports that clonazepam prescribed recently is very helpful for anxiety and helps her stay away from alcohol?  Visit Diagnosis:    ICD-10-CM   1. Other mixed anxiety disorders  F41.3   2. Anxiety  F41.9 FLUoxetine (PROZAC) 40 MG capsule  3. Alcohol use disorder, severe, in early remission ([place])  F10.21     Past Psychiatric History: Please see intake H&P.  Past Medical History:  Past Medical History:  Diagnosis Date  . Alcohol use   . Anxiety   . Bipolar affective disorder ([place])    With anxiety features  . Bunionette of right foot 11/2019  . Cirrhosis of liver ([place])    Due to alcohol and hepatitis C  . Depression   . 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 ETOHism ([place])   . GERD (gastroesophageal reflux disease)   . Hematemesis 02/10/2018  . Hepatitis C 2018   hepatitis c and alcohol related hepatitis  . Heroin abuse ([place])   . History of blood transfusion    "blood doesn't clot; I fell down and had to have a transfusion"  . History of kidney stones   . History of thrombocytopenia   . Menopause 2016  . Migraine    "when I get really stressed" (09/01/2017)  . S/P foot surgery, right 11/2019  . Schizophrenia ([place])   . Seizures ([place])    "when I run out of my RX; lots recently" (09/01/2017)  . Thrombocytopenia ([place])  Past Surgical History:  Procedure Laterality Date  . ESOPHAGOGASTRODUODENOSCOPY N/A 09/03/2017   Procedure: ESOPHAGOGASTRODUODENOSCOPY (EGD);  Surgeon: Doran Stabler, MD;  Location: [facility];  Service: Gastroenterology;  Laterality: N/A;  . FINGER FRACTURE SURGERY Left    "shattered my pinky"  . FRACTURE SURGERY    . I & D EXTREMITY Left 09/18/2018   Procedure: IRRIGATION AND DEBRIDEMENT EXTREMITY;  Surgeon: Leanora Cover, MD;  Location: [facility];  Service: Orthopedics;  Laterality: Left;  . IR  PARACENTESIS  07/23/2017  . IR PARACENTESIS  07/2017   "did it twice in the same week" (09/01/2017)  . SHOULDER OPEN ROTATOR CUFF REPAIR Right   . TOOTH EXTRACTION  08/2019  . TUBAL LIGATION    . VAGINAL HYSTERECTOMY      Family Psychiatric History: Reviewed.  Family History:  Family History  Problem Relation Age of Onset  . Lung cancer Mother 25  . Alcohol abuse Mother   . Throat cancer Father 23    Social History:  Social History   Socioeconomic History  . Marital status: Divorced    Spouse name: Not on file  . Number of children: 2  . Years of education: Not on file  . Highest education level: Not on file  Occupational History  . Occupation: applying for disability  Tobacco Use  . Smoking status: Never Smoker  . Smokeless tobacco: Never Used  Vaping Use  . Vaping Use: Never used  Substance and Sexual Activity  . Alcohol use: Yes    Alcohol/week: 6.0 standard drinks    Types: 6 Cans of beer per week    Comment:  6 pack per week.  . Drug use: Not Currently    Comment: cocaiane not currently  . Sexual activity: Yes  Other Topics Concern  . Not on file  Social History Narrative   She moved with a boyfriend to [place] and was followed at [facility].  He died of a massive heart attack in 8/18, per her report, and so she moved back to [place] and is living with a friend.      Right handed   2 story home    Lives alone 11/09/19   Social Determinants of Health   Financial Resource Strain:   . Difficulty of Paying Living Expenses: Not on file  Food Insecurity:   . Worried About Charity fundraiser in the Last Year: Not on file  . Ran Out of Food in the Last Year: Not on file  Transportation Needs:   . Lack of Transportation (Medical): Not on file  . Lack of Transportation (Non-Medical): Not on file  Physical Activity:   . Days of Exercise per Week: Not on file  . Minutes of Exercise per Session: Not on file  Stress:   . Feeling of Stress : Not on  file  Social Connections:   . Frequency of Communication with Friends and Family: Not on file  . Frequency of Social Gatherings with Friends and Family: Not on file  . Attends Religious Services: Not on file  . Active Member of Clubs or Organizations: Not on file  . Attends Archivist Meetings: Not on file  . Marital Status: Not on file    Allergies:  Allergies  Allergen Reactions  . Other Other (See Comments)    Platelets: Rx chest pain, tremors, body aches    Metabolic Disorder Labs: Lab Results  Component Value Date   HGBA1C 5.1 12/15/2019   MPG 100  12/15/2019   No results found for: PROLACTIN Lab Results  Component Value Date   CHOL 213 (H) 12/13/2019   TRIG 85 12/13/2019   HDL 108 12/13/2019   CHOLHDL 2.0 12/13/2019   LDLCALC 90 12/13/2019   LDLCALC 89 05/05/2019   Lab Results  Component Value Date   TSH 0.304 (L) 12/16/2019   TSH 0.618 12/13/2019    Therapeutic Level Labs: No results found for: LITHIUM No results found for: VALPROATE No components found for:  CBMZ  Current Medications: Current Outpatient Medications  Medication Sig Dispense Refill  . [START ON 06/05/2020] ARIPiprazole (ABILIFY) 10 MG tablet Take 1 tablet (10 mg total) by mouth daily. 30 tablet 2  . [START ON 05/13/2020] clonazePAM (KLONOPIN) 1 MG tablet Take 1 tablet (1 mg total) by mouth 2 (two) times daily as needed for anxiety. 60 tablet 2  . [START ON 06/05/2020] FLUoxetine (PROZAC) 40 MG capsule Take 1 capsule (40 mg total) by mouth daily. 30 capsule 2  . furosemide (LASIX) 40 MG tablet Take 1 tablet (40 mg total) by mouth daily. 30 tablet 7  . gabapentin (NEURONTIN) 300 MG capsule Take 2 capsules (600 mg), by mouth, 3 times a day. 180 capsule 3  . HYDROcodone-acetaminophen (NORCO) 10-325 MG tablet Take 1 tablet by mouth every 8 (eight) hours as needed. 30 tablet 0  . hydrOXYzine (ATARAX/VISTARIL) 25 MG tablet Take 1 tablet (25 mg total) by mouth every 6 (six) hours as needed for  itching. 90 tablet 1  . lactulose (CHRONULAC) 10 GM/15ML solution Take 30 mLs (20 g total) by mouth 3 (three) times daily. Goal to have 2-3 BMs daily (Patient not taking: Reported on 04/19/2020) 240 mL 3  . levETIRAcetam (KEPPRA) 500 MG tablet Take 1 tablet (500 mg total) by mouth 2 (two) times daily. 180 tablet 3  . [START ON 06/05/2020] mirtazapine (REMERON) 30 MG tablet Take 1 tablet (30 mg total) by mouth at bedtime. 30 tablet 2  . Multiple Vitamin (MULTIVITAMIN WITH MINERALS) TABS tablet Take 1 tablet by mouth daily.    . polyethylene glycol (MIRALAX / GLYCOLAX) 17 g packet Take 17 g by mouth 2 (two) times daily. (Patient taking differently: Take 17 g by mouth daily as needed for mild constipation. ) 14 each 0   No current facility-administered medications for this visit.      Psychiatric Specialty Exam: Review of Systems  Constitutional: Positive for fatigue.  Psychiatric/Behavioral: The patient is nervous/anxious.   All other systems reviewed and are negative.   There were no vitals taken for this visit.There is no height or weight on file to calculate BMI.  General Appearance: NA  Eye Contact:  NA  Speech:  Clear and Coherent and Normal Rate  Volume:  Normal  Mood:  Anxious  Affect:  NA  Thought Process:  Goal Directed  Orientation:  Full (Time, Place, and Person)  Thought Content: Logical   Suicidal Thoughts:  No  Homicidal Thoughts:  No  Memory:  Immediate;   Good Recent;   Good Remote;   Good  Judgement:  Fair  Insight:  Fair  Psychomotor Activity:  NA  Concentration:  Concentration: Good  Recall:  Good  Fund of Knowledge: Good  Language: Good  Akathisia:  Negative  Handed:  Right  AIMS (if indicated): not done  Assets:  Communication Skills Desire for Improvement Housing Resilience  ADL's:  Intact  Cognition: WNL  Sleep:  Good   Screenings: AIMS     Admission (Discharged)  from 08/25/2018 in [facility] Admission  (Discharged) from 10/28/2017 in [facility]  AIMS Total Score 0 0    AUDIT     Admission (Discharged) from 08/25/2018 in [facility] Admission (Discharged) from 10/28/2017 in [facility]  Alcohol Use Disorder Identification Test Final Score (AUDIT) 40 32    PHQ2-9     Office Visit from 05/05/2019 in [facility] Office Visit from 04/22/2019 in [facility] for [facility]ious [facility] Office Visit from 02/19/2019 in [facility] Office Visit from 09/29/2018 in [facility] Office Visit from 12/16/2017 in [facility] for Infectious Disease  PHQ-2 Total Score 0 0 0 0 1       Assessment and Plan:  49 yo divorced female with a long hx of alcohol use disorder, polysubstance abuse (cocaine, heroin), and past diagnoses of major depressive disorder vs substance-induced mood disorder vs bipolar disorder. Diagnosis of schizophrenia is also mentioned in the record but patient is unable to provide any clear history of psychotic episodes unrelated to her drug/alcohol use. She was first treated for mental problems at age 24 when she became depressed and OD on meds. She has been taking fluoxetine (up to 60 mg) for over 20 years per her report. She also tried buspirone, lorazepamand hydroxyzine for anxiety with no clear benefit. Recently prescribed trazodone for insomnia does not appear to work either. She has never been on any mood stabilizers except for lithium or antipsychotics apart from haloperidol. She did not like how she felt on either of those. She started abusing alcohol at age of 79. At one time she was drinking a 1/2 gallon of vodka plus some beer on a daily basis. She has a hx of alcohol withdrawal delirium. She has some periods of sobriety lasting few months at a time but cannot provide more details. She has alcohol liver cirrhosis,  thrombocytopenia andhepatitis C. Last platelet count 205. She minimizes use of illicit drugs in the past claiming that she only usedthem when "partying".We have added mirtazapine and she reports that sleep has improved. She continues to complain primarily of severe anxiety both generalized and panic type. She has no support, struggles financially. Briana Sweeney reports that she has not been drinking alcohol last two months. She has not been taking buspirone or hydroxyzine but reports that clonazepam prescribed recently is very helpful for anxiety and helps her stay away from alcohol?  MW:UXLKG anxiety disorder;Mood disorder unspecified (Drug-induced vs bipolar disorder unspecified); Alcohol use disorder severe in early remission  Plan: We will continue fluoxetine 40 mg daily, Abilify to 10 mg, Remeron 30 mg andclonazepam1 mg prn anxiety (bid).Next appointment in3 months. The plan was discussed with patient who had an opportunity to ask questions and these were all answered. I spend21minutes in phone consultationwith the patient.    Stephanie Acre, MD 05/08/2020, 9:39 AM

## 2020-05-10 ENCOUNTER — Inpatient Hospital Stay: Payer: Medicaid Other

## 2020-05-10 ENCOUNTER — Inpatient Hospital Stay: Payer: Medicaid Other | Attending: Hematology & Oncology

## 2020-05-10 ENCOUNTER — Other Ambulatory Visit: Payer: Self-pay

## 2020-05-10 VITALS — BP 114/89 | HR 91 | Temp 98.6°F | Resp 18 | Wt 141.1 lb

## 2020-05-10 DIAGNOSIS — D759 Disease of blood and blood-forming organs, unspecified: Secondary | ICD-10-CM | POA: Diagnosis not present

## 2020-05-10 DIAGNOSIS — D696 Thrombocytopenia, unspecified: Secondary | ICD-10-CM

## 2020-05-10 DIAGNOSIS — D693 Immune thrombocytopenic purpura: Secondary | ICD-10-CM | POA: Insufficient documentation

## 2020-05-10 DIAGNOSIS — T5191XA Toxic effect of unspecified alcohol, accidental (unintentional), initial encounter: Secondary | ICD-10-CM | POA: Diagnosis not present

## 2020-05-10 DIAGNOSIS — D61818 Other pancytopenia: Secondary | ICD-10-CM

## 2020-05-10 DIAGNOSIS — R3915 Urgency of urination: Secondary | ICD-10-CM

## 2020-05-10 HISTORY — DX: Urgency of urination: R39.15

## 2020-05-10 LAB — CBC WITH DIFFERENTIAL (CANCER CENTER ONLY)
Abs Immature Granulocytes: 0 10*3/uL (ref 0.00–0.07)
Basophils Absolute: 0 10*3/uL (ref 0.0–0.1)
Basophils Relative: 1 %
Eosinophils Absolute: 0.2 10*3/uL (ref 0.0–0.5)
Eosinophils Relative: 8 %
HCT: 40.3 % (ref 36.0–46.0)
Hemoglobin: 13.6 g/dL (ref 12.0–15.0)
Immature Granulocytes: 0 %
Lymphocytes Relative: 24 %
Lymphs Abs: 0.7 10*3/uL (ref 0.7–4.0)
MCH: 30.9 pg (ref 26.0–34.0)
MCHC: 33.7 g/dL (ref 30.0–36.0)
MCV: 91.6 fL (ref 80.0–100.0)
Monocytes Absolute: 0.4 10*3/uL (ref 0.1–1.0)
Monocytes Relative: 14 %
Neutro Abs: 1.6 10*3/uL — ABNORMAL LOW (ref 1.7–7.7)
Neutrophils Relative %: 53 %
Platelet Count: 34 10*3/uL — ABNORMAL LOW (ref 150–400)
RBC: 4.4 MIL/uL (ref 3.87–5.11)
RDW: 15.1 % (ref 11.5–15.5)
WBC Count: 3 10*3/uL — ABNORMAL LOW (ref 4.0–10.5)
nRBC: 0 % (ref 0.0–0.2)

## 2020-05-10 LAB — CMP (CANCER CENTER ONLY)
ALT: 13 U/L (ref 0–44)
AST: 31 U/L (ref 15–41)
Albumin: 3.5 g/dL (ref 3.5–5.0)
Alkaline Phosphatase: 145 U/L — ABNORMAL HIGH (ref 38–126)
Anion gap: 9 (ref 5–15)
BUN: 6 mg/dL (ref 6–20)
CO2: 22 mmol/L (ref 22–32)
Calcium: 9.7 mg/dL (ref 8.9–10.3)
Chloride: 107 mmol/L (ref 98–111)
Creatinine: 0.6 mg/dL (ref 0.44–1.00)
GFR, Est AFR Am: >60 mL/min (ref >60)
GFR, Estimated: >60 mL/min (ref >60)
Glucose, Bld: 96 mg/dL (ref 70–99)
Potassium: 3.8 mmol/L (ref 3.5–5.1)
Sodium: 138 mmol/L (ref 135–145)
Total Bilirubin: 1.4 mg/dL — ABNORMAL HIGH (ref 0.3–1.2)
Total Protein: 7 g/dL (ref 6.5–8.1)

## 2020-05-10 LAB — PLATELET BY CITRATE

## 2020-05-10 MED ORDER — LACTULOSE 10 GM/15ML PO SOLN: 20.0000 g | Freq: Three times a day (TID) | ORAL | 3 refills | Status: DC

## 2020-05-10 MED ORDER — ROMIPLOSTIM 250 MCG ~~LOC~~ SOLR
190.0000 ug | Freq: Once | SUBCUTANEOUS | Status: AC
  Administered 2020-05-10: 190 ug via SUBCUTANEOUS
  Filled 2020-05-10: qty 0.38

## 2020-05-10 MED FILL — LACTULOSE 10 GM/15 ML SOLN: 10 | 2 days supply | Qty: 240 | Fill #0

## 2020-05-10 NOTE — Progress Notes (Signed)
NPlate 3 mcg/kg today per Dr. Marin Olp. Dose entered per his instructions.

## 2020-05-10 NOTE — Patient Instructions (Signed)
Romiplostim injection What is this medicine? ROMIPLOSTIM (roe mi PLOE stim) helps your body make more platelets. This medicine is used to treat low platelets caused by chronic idiopathic thrombocytopenic purpura (ITP). This medicine may be used for other purposes; ask your health care provider or pharmacist if you have questions. COMMON BRAND NAME(S): Nplate What should I tell my health care provider before I take this medicine? They need to know if you have any of these conditions:  bleeding disorders  bone marrow problem, like blood cancer or myelodysplastic syndrome  history of blood clots  liver disease  surgery to remove your spleen  an unusual or allergic reaction to romiplostim, mannitol, other medicines, foods, dyes, or preservatives  pregnant or trying to get pregnant  breast-feeding How should I use this medicine? This medicine is for injection under the skin. It is given by a health care professional in a hospital or clinic setting. A special MedGuide will be given to you before your injection. Read this information carefully each time. Talk to your pediatrician regarding the use of this medicine in children. While this drug may be prescribed for children as young as 1 year for selected conditions, precautions do apply. Overdosage: If you think you have taken too much of this medicine contact a poison control center or emergency room at once. NOTE: This medicine is only for you. Do not share this medicine with others. What if I miss a dose? It is important not to miss your dose. Call your doctor or health care professional if you are unable to keep an appointment. What may interact with this medicine? Interactions are not expected. This list may not describe all possible interactions. Give your health care provider a list of all the medicines, herbs, non-prescription drugs, or dietary supplements you use. Also tell them if you smoke, drink alcohol, or use illegal drugs.  Some items may interact with your medicine. What should I watch for while using this medicine? Your condition will be monitored carefully while you are receiving this medicine. Visit your prescriber or health care professional for regular checks on your progress and for the needed blood tests. It is important to keep all appointments. What side effects may I notice from receiving this medicine? Side effects that you should report to your doctor or health care professional as soon as possible:  allergic reactions like skin rash, itching or hives, swelling of the face, lips, or tongue  signs and symptoms of bleeding such as bloody or black, tarry stools; red or dark brown urine; spitting up blood or brown material that looks like coffee grounds; red spots on the skin; unusual bruising or bleeding from the eyes, gums, or nose  signs and symptoms of a blood clot such as chest pain; shortness of breath; pain, swelling, or warmth in the leg  signs and symptoms of a stroke like changes in vision; confusion; trouble speaking or understanding; severe headaches; sudden numbness or weakness of the face, arm or leg; trouble walking; dizziness; loss of balance or coordination Side effects that usually do not require medical attention (report to your doctor or health care professional if they continue or are bothersome):  headache  pain in arms and legs  pain in mouth  stomach pain This list may not describe all possible side effects. Call your doctor for medical advice about side effects. You may report side effects to FDA at 1-800-FDA-1088. Where should I keep my medicine? This drug is given in a hospital or clinic   and will not be stored at home. NOTE: This sheet is a summary. It may not cover all possible information. If you have questions about this medicine, talk to your doctor, pharmacist, or health care provider.  2020 Elsevier/Gold Standard (2017-08-25 11:10:55)  

## 2020-05-11 MED FILL — CLONAZEPAM 1 MG TABS: 1 | 30 days supply | Qty: 60 | Fill #0

## 2020-05-11 MED FILL — oxyCODONE HCL 5 MG TABS: 5 | 7 days supply | Qty: 42 | Fill #0

## 2020-05-17 ENCOUNTER — Inpatient Hospital Stay: Payer: Medicaid Other

## 2020-05-17 ENCOUNTER — Telehealth: Payer: Self-pay | Admitting: Family

## 2020-05-17 ENCOUNTER — Inpatient Hospital Stay (HOSPITAL_BASED_OUTPATIENT_CLINIC_OR_DEPARTMENT_OTHER): Payer: Medicaid Other | Admitting: Family

## 2020-05-17 ENCOUNTER — Other Ambulatory Visit: Payer: Self-pay

## 2020-05-17 ENCOUNTER — Encounter: Payer: Self-pay | Admitting: Family

## 2020-05-17 VITALS — BP 112/82 | HR 75 | Temp 97.9°F | Resp 18 | Ht 60.0 in | Wt 147.0 lb

## 2020-05-17 DIAGNOSIS — D693 Immune thrombocytopenic purpura: Secondary | ICD-10-CM | POA: Diagnosis not present

## 2020-05-17 DIAGNOSIS — D696 Thrombocytopenia, unspecified: Secondary | ICD-10-CM | POA: Diagnosis not present

## 2020-05-17 DIAGNOSIS — D61818 Other pancytopenia: Secondary | ICD-10-CM

## 2020-05-17 LAB — CBC WITH DIFFERENTIAL (CANCER CENTER ONLY)
Abs Immature Granulocytes: 0 10*3/uL (ref 0.00–0.07)
Basophils Absolute: 0 10*3/uL (ref 0.0–0.1)
Basophils Relative: 1 %
Eosinophils Absolute: 0.3 10*3/uL (ref 0.0–0.5)
Eosinophils Relative: 10 %
HCT: 40.7 % (ref 36.0–46.0)
Hemoglobin: 13.7 g/dL (ref 12.0–15.0)
Immature Granulocytes: 0 %
Lymphocytes Relative: 27 %
Lymphs Abs: 0.8 10*3/uL (ref 0.7–4.0)
MCH: 31.4 pg (ref 26.0–34.0)
MCHC: 33.7 g/dL (ref 30.0–36.0)
MCV: 93.1 fL (ref 80.0–100.0)
Monocytes Absolute: 0.5 10*3/uL (ref 0.1–1.0)
Monocytes Relative: 17 %
Neutro Abs: 1.3 10*3/uL — ABNORMAL LOW (ref 1.7–7.7)
Neutrophils Relative %: 45 %
Platelet Count: 65 10*3/uL — ABNORMAL LOW (ref 150–400)
RBC: 4.37 MIL/uL (ref 3.87–5.11)
RDW: 15.4 % (ref 11.5–15.5)
WBC Count: 2.8 10*3/uL — ABNORMAL LOW (ref 4.0–10.5)
nRBC: 0 % (ref 0.0–0.2)

## 2020-05-17 LAB — CMP (CANCER CENTER ONLY)
ALT: 17 U/L (ref 0–44)
AST: 38 U/L (ref 15–41)
Albumin: 3.6 g/dL (ref 3.5–5.0)
Alkaline Phosphatase: 146 U/L — ABNORMAL HIGH (ref 38–126)
Anion gap: 10 (ref 5–15)
BUN: 7 mg/dL (ref 6–20)
CO2: 28 mmol/L (ref 22–32)
Calcium: 9.1 mg/dL (ref 8.9–10.3)
Chloride: 105 mmol/L (ref 98–111)
Creatinine: 0.7 mg/dL (ref 0.44–1.00)
GFR, Est AFR Am: >60 mL/min (ref >60)
GFR, Estimated: >60 mL/min (ref >60)
Glucose, Bld: 81 mg/dL (ref 70–99)
Potassium: 3.1 mmol/L — ABNORMAL LOW (ref 3.5–5.1)
Sodium: 143 mmol/L (ref 135–145)
Total Bilirubin: 0.8 mg/dL (ref 0.3–1.2)
Total Protein: 7.1 g/dL (ref 6.5–8.1)

## 2020-05-17 LAB — PLATELET BY CITRATE

## 2020-05-17 MED ORDER — ROMIPLOSTIM 250 MCG ~~LOC~~ SOLR
3.0000 ug/kg | Freq: Once | SUBCUTANEOUS | Status: AC
  Administered 2020-05-17: 190 ug via SUBCUTANEOUS
  Filled 2020-05-17: qty 0.38

## 2020-05-17 MED FILL — oxyCODONE HCL 5 MG TABS: 5 | 5 days supply | Qty: 30 | Fill #0

## 2020-05-17 NOTE — Telephone Encounter (Signed)
Called and spoke with patient regarding appointments scheduled she asked me to mail her a copy of the schedule.  Mailed today per 9/8 los

## 2020-05-17 NOTE — Patient Instructions (Signed)
Romiplostim injection What is this medicine? ROMIPLOSTIM (roe mi PLOE stim) helps your body make more platelets. This medicine is used to treat low platelets caused by chronic idiopathic thrombocytopenic purpura (ITP). This medicine may be used for other purposes; ask your health care provider or pharmacist if you have questions. COMMON BRAND NAME(S): Nplate What should I tell my health care provider before I take this medicine? They need to know if you have any of these conditions:  bleeding disorders  bone marrow problem, like blood cancer or myelodysplastic syndrome  history of blood clots  liver disease  surgery to remove your spleen  an unusual or allergic reaction to romiplostim, mannitol, other medicines, foods, dyes, or preservatives  pregnant or trying to get pregnant  breast-feeding How should I use this medicine? This medicine is for injection under the skin. It is given by a health care professional in a hospital or clinic setting. A special MedGuide will be given to you before your injection. Read this information carefully each time. Talk to your pediatrician regarding the use of this medicine in children. While this drug may be prescribed for children as young as 1 year for selected conditions, precautions do apply. Overdosage: If you think you have taken too much of this medicine contact a poison control center or emergency room at once. NOTE: This medicine is only for you. Do not share this medicine with others. What if I miss a dose? It is important not to miss your dose. Call your doctor or health care professional if you are unable to keep an appointment. What may interact with this medicine? Interactions are not expected. This list may not describe all possible interactions. Give your health care provider a list of all the medicines, herbs, non-prescription drugs, or dietary supplements you use. Also tell them if you smoke, drink alcohol, or use illegal drugs.  Some items may interact with your medicine. What should I watch for while using this medicine? Your condition will be monitored carefully while you are receiving this medicine. Visit your prescriber or health care professional for regular checks on your progress and for the needed blood tests. It is important to keep all appointments. What side effects may I notice from receiving this medicine? Side effects that you should report to your doctor or health care professional as soon as possible:  allergic reactions like skin rash, itching or hives, swelling of the face, lips, or tongue  signs and symptoms of bleeding such as bloody or black, tarry stools; red or dark brown urine; spitting up blood or brown material that looks like coffee grounds; red spots on the skin; unusual bruising or bleeding from the eyes, gums, or nose  signs and symptoms of a blood clot such as chest pain; shortness of breath; pain, swelling, or warmth in the leg  signs and symptoms of a stroke like changes in vision; confusion; trouble speaking or understanding; severe headaches; sudden numbness or weakness of the face, arm or leg; trouble walking; dizziness; loss of balance or coordination Side effects that usually do not require medical attention (report to your doctor or health care professional if they continue or are bothersome):  headache  pain in arms and legs  pain in mouth  stomach pain This list may not describe all possible side effects. Call your doctor for medical advice about side effects. You may report side effects to FDA at 1-800-FDA-1088. Where should I keep my medicine? This drug is given in a hospital or clinic   and will not be stored at home. NOTE: This sheet is a summary. It may not cover all possible information. If you have questions about this medicine, talk to your doctor, pharmacist, or health care provider.  2020 Elsevier/Gold Standard (2017-08-25 11:10:55)  

## 2020-05-17 NOTE — Progress Notes (Signed)
Hematology and Oncology Follow Up Visit  Briana Sweeney [id number] 28-May-1971 49 y.o. 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 She is doing fairly well. She did have a fall several weeks ago landing on her right hand. This caused a right 2nd proximal metacarpal fracture. She has a cast on and states that she is taking Advil for the pain when needed.  No new falls. No syncopal episodes.  She has not noted any blood loss. No bruising or petechiae.  Platelets are stable at 65, Hgb 13.7 and WBC count 2.8.  No fever, chills, n/v, cough, rash, dizziness, SOB, chest pain, palpitations, abdominal pain or changes in bowel or bladder habits.  No numbness or tingling in her extremities.  She states that she has a good appetite and is staying well hydrated. Her weight is stable.   ECOG Performance Status: 1 - Symptomatic but completely ambulatory  Medications:  Allergies as of 05/17/2020      Reactions   Other Other (See Comments)   Platelets: Rx chest pain, tremors, body aches      Medication List       Accurate as of May 17, 2020  9:22 AM. If you have any questions, ask your nurse or doctor.        ARIPiprazole 10 MG tablet Commonly known as: ABILIFY Take 1 tablet (10 mg total) by mouth daily. Start taking on: June 05, 2020   clonazePAM 1 MG tablet Commonly known as: KLONOPIN Take 1 tablet (1 mg total) by mouth 2 (two) times daily as needed for anxiety.   FLUoxetine 40 MG capsule Commonly known as: PROZAC Take 1 capsule (40 mg total) by mouth daily. Start taking on: June 05, 2020   furosemide 40 MG tablet Commonly known as: Lasix Take 1 tablet (40 mg total) by mouth daily.   gabapentin 300 MG capsule Commonly known as: NEURONTIN Take 2 capsules (600 mg), by mouth, 3 times  a day.   HYDROcodone-acetaminophen 10-325 MG tablet Commonly known as: NORCO Take 1 tablet by mouth every 8 (eight) hours as needed.   hydrOXYzine 25 MG tablet Commonly known as: ATARAX/VISTARIL Take 1 tablet (25 mg total) by mouth every 6 (six) hours as needed for itching.   lactulose 10 GM/15ML solution Commonly known as: CHRONULAC Take 30 mLs (20 g total) by mouth 3 (three) times daily. Goal to have 2-3 BMs daily   levETIRAcetam 500 MG tablet Commonly known as: Keppra Take 1 tablet (500 mg total) by mouth 2 (two) times daily.   mirtazapine 30 MG tablet Commonly known as: REMERON Take 1 tablet (30 mg total) by mouth at bedtime. Start taking on: June 05, 2020   multivitamin with minerals Tabs tablet Take 1 tablet by mouth daily.   polyethylene glycol 17 g packet Commonly known as: MIRALAX / GLYCOLAX Take 17 g by mouth 2 (two) times daily. What changed:   when to take this  reasons to take this       Allergies:  Allergies  Allergen Reactions  . Other Other (See Comments)    Platelets: Rx chest pain, tremors, body aches    Past Medical History, Surgical history, Social history, and Family History were reviewed and updated.  Review of Systems: All other 10 point review of systems is negative.   Physical Exam:  vitals were  not taken for this visit.   Wt Readings from Last 3 Encounters:  05/10/20 141 lb 1.5 oz (64 kg)  04/25/20 147 lb 11.3 oz (67 kg)  04/19/20 148 lb 1.9 oz (67.2 kg)    Ocular: Sclerae unicteric, pupils equal, round and reactive to light Ear-nose-throat: Oropharynx clear, dentition fair Lymphatic: No cervical or supraclavicular adenopathy Lungs no rales or rhonchi, good excursion bilaterally Heart regular rate and rhythm, no murmur appreciated Abd soft, nontender, positive bowel sounds MSK no focal spinal tenderness, no joint edema Neuro: non-focal, well-oriented, appropriate affect Breasts: Deferred   Lab Results  Component  Value Date   WBC 2.8 (L) 05/17/2020   HGB 13.7 05/17/2020   HCT 40.7 05/17/2020   MCV 93.1 05/17/2020   PLT 65 (L) 05/17/2020   No results found for: FERRITIN, IRON, TIBC, UIBC, IRONPCTSAT Lab Results  Component Value Date   RBC 4.37 05/17/2020   No results found for: KPAFRELGTCHN, LAMBDASER, KAPLAMBRATIO No results found for: IGGSERUM, IGA, IGMSERUM No results found for: Odetta Pink, SPEI   Chemistry      Component Value Date/Time   NA 143 05/17/2020 0846   NA 142 01/04/2020 1510   K 3.1 (L) 05/17/2020 0846   CL 105 05/17/2020 0846   CO2 28 05/17/2020 0846   BUN 7 05/17/2020 0846   BUN 6 01/04/2020 1510   CREATININE 0.70 05/17/2020 0846   CREATININE 0.54 05/01/2020 1043      Component Value Date/Time   CALCIUM 9.1 05/17/2020 0846   ALKPHOS 146 (H) 05/17/2020 0846   AST 38 05/17/2020 0846   ALT 17 05/17/2020 0846   BILITOT 0.8 05/17/2020 0846       Impression and Plan: Briana Sweeney is a very pleasant 49 yo caucasian female with 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  She received Nplate today. Platelet count is 65.  We will continue her weekly injections and she will have a follow-up in 6 weeks.  She can contact our office with any questions or concerns. We can certainly see her sooner if needed.   Laverna Peace, NP 9/8/20219:22 AM

## 2020-05-19 ENCOUNTER — Other Ambulatory Visit: Payer: Medicaid Other

## 2020-05-19 ENCOUNTER — Ambulatory Visit: Payer: Medicaid Other | Admitting: Family

## 2020-05-19 ENCOUNTER — Ambulatory Visit: Payer: Medicaid Other

## 2020-05-24 ENCOUNTER — Inpatient Hospital Stay: Payer: Medicaid Other

## 2020-05-24 ENCOUNTER — Other Ambulatory Visit: Payer: Self-pay

## 2020-05-24 VITALS — BP 129/94 | HR 92 | Temp 98.2°F | Resp 18

## 2020-05-24 DIAGNOSIS — D696 Thrombocytopenia, unspecified: Secondary | ICD-10-CM

## 2020-05-24 DIAGNOSIS — D61818 Other pancytopenia: Secondary | ICD-10-CM

## 2020-05-24 DIAGNOSIS — D693 Immune thrombocytopenic purpura: Secondary | ICD-10-CM | POA: Diagnosis not present

## 2020-05-24 LAB — CBC WITH DIFFERENTIAL (CANCER CENTER ONLY)
Abs Immature Granulocytes: 0 10*3/uL (ref 0.00–0.07)
Basophils Absolute: 0 10*3/uL (ref 0.0–0.1)
Basophils Relative: 1 %
Eosinophils Absolute: 0.3 10*3/uL (ref 0.0–0.5)
Eosinophils Relative: 9 %
HCT: 43.3 % (ref 36.0–46.0)
Hemoglobin: 14.6 g/dL (ref 12.0–15.0)
Immature Granulocytes: 0 %
Lymphocytes Relative: 22 %
Lymphs Abs: 0.7 10*3/uL (ref 0.7–4.0)
MCH: 30.9 pg (ref 26.0–34.0)
MCHC: 33.7 g/dL (ref 30.0–36.0)
MCV: 91.7 fL (ref 80.0–100.0)
Monocytes Absolute: 0.4 10*3/uL (ref 0.1–1.0)
Monocytes Relative: 13 %
Neutro Abs: 1.7 10*3/uL (ref 1.7–7.7)
Neutrophils Relative %: 55 %
Platelet Count: 102 10*3/uL — ABNORMAL LOW (ref 150–400)
RBC: 4.72 MIL/uL (ref 3.87–5.11)
RDW: 15.3 % (ref 11.5–15.5)
WBC Count: 3.1 10*3/uL — ABNORMAL LOW (ref 4.0–10.5)
nRBC: 0 % (ref 0.0–0.2)

## 2020-05-24 LAB — CMP (CANCER CENTER ONLY)
ALT: 18 U/L (ref 0–44)
AST: 43 U/L — ABNORMAL HIGH (ref 15–41)
Albumin: 4 g/dL (ref 3.5–5.0)
Alkaline Phosphatase: 170 U/L — ABNORMAL HIGH (ref 38–126)
Anion gap: 10 (ref 5–15)
BUN: <5 mg/dL — ABNORMAL LOW (ref 6–20)
CO2: 30 mmol/L (ref 22–32)
Calcium: 9.6 mg/dL (ref 8.9–10.3)
Chloride: 102 mmol/L (ref 98–111)
Creatinine: 0.76 mg/dL (ref 0.44–1.00)
GFR, Est AFR Am: >60 mL/min (ref >60)
GFR, Estimated: >60 mL/min (ref >60)
Glucose, Bld: 93 mg/dL (ref 70–99)
Potassium: 3.4 mmol/L — ABNORMAL LOW (ref 3.5–5.1)
Sodium: 142 mmol/L (ref 135–145)
Total Bilirubin: 1 mg/dL (ref 0.3–1.2)
Total Protein: 7.7 g/dL (ref 6.5–8.1)

## 2020-05-24 LAB — PLATELET BY CITRATE

## 2020-05-24 MED ORDER — ROMIPLOSTIM 250 MCG ~~LOC~~ SOLR
130.0000 ug | Freq: Once | SUBCUTANEOUS | Status: AC
  Administered 2020-05-24: 130 ug via SUBCUTANEOUS
  Filled 2020-05-24: qty 0.26

## 2020-05-24 NOTE — Progress Notes (Signed)
NPlate dose will be 2 mcg/kg today per Dr. Marin Olp.

## 2020-05-24 NOTE — Patient Instructions (Signed)
Romiplostim injection What is this medicine? ROMIPLOSTIM (roe mi PLOE stim) helps your body make more platelets. This medicine is used to treat low platelets caused by chronic idiopathic thrombocytopenic purpura (ITP). This medicine may be used for other purposes; ask your health care provider or pharmacist if you have questions. COMMON BRAND NAME(S): Nplate What should I tell my health care provider before I take this medicine? They need to know if you have any of these conditions:  bleeding disorders  bone marrow problem, like blood cancer or myelodysplastic syndrome  history of blood clots  liver disease  surgery to remove your spleen  an unusual or allergic reaction to romiplostim, mannitol, other medicines, foods, dyes, or preservatives  pregnant or trying to get pregnant  breast-feeding How should I use this medicine? This medicine is for injection under the skin. It is given by a health care professional in a hospital or clinic setting. A special MedGuide will be given to you before your injection. Read this information carefully each time. Talk to your pediatrician regarding the use of this medicine in children. While this drug may be prescribed for children as young as 1 year for selected conditions, precautions do apply. Overdosage: If you think you have taken too much of this medicine contact a poison control center or emergency room at once. NOTE: This medicine is only for you. Do not share this medicine with others. What if I miss a dose? It is important not to miss your dose. Call your doctor or health care professional if you are unable to keep an appointment. What may interact with this medicine? Interactions are not expected. This list may not describe all possible interactions. Give your health care provider a list of all the medicines, herbs, non-prescription drugs, or dietary supplements you use. Also tell them if you smoke, drink alcohol, or use illegal drugs.  Some items may interact with your medicine. What should I watch for while using this medicine? Your condition will be monitored carefully while you are receiving this medicine. Visit your prescriber or health care professional for regular checks on your progress and for the needed blood tests. It is important to keep all appointments. What side effects may I notice from receiving this medicine? Side effects that you should report to your doctor or health care professional as soon as possible:  allergic reactions like skin rash, itching or hives, swelling of the face, lips, or tongue  signs and symptoms of bleeding such as bloody or black, tarry stools; red or dark brown urine; spitting up blood or brown material that looks like coffee grounds; red spots on the skin; unusual bruising or bleeding from the eyes, gums, or nose  signs and symptoms of a blood clot such as chest pain; shortness of breath; pain, swelling, or warmth in the leg  signs and symptoms of a stroke like changes in vision; confusion; trouble speaking or understanding; severe headaches; sudden numbness or weakness of the face, arm or leg; trouble walking; dizziness; loss of balance or coordination Side effects that usually do not require medical attention (report to your doctor or health care professional if they continue or are bothersome):  headache  pain in arms and legs  pain in mouth  stomach pain This list may not describe all possible side effects. Call your doctor for medical advice about side effects. You may report side effects to FDA at 1-800-FDA-1088. Where should I keep my medicine? This drug is given in a hospital or clinic   and will not be stored at home. NOTE: This sheet is a summary. It may not cover all possible information. If you have questions about this medicine, talk to your doctor, pharmacist, or health care provider.  2020 Elsevier/Gold Standard (2017-08-25 11:10:55)  

## 2020-05-25 MED FILL — HYDROCODON-APAP 5-325: 5-325 | 7 days supply | Qty: 40 | Fill #0

## 2020-05-26 ENCOUNTER — Ambulatory Visit (INDEPENDENT_AMBULATORY_CARE_PROVIDER_SITE_OTHER): Payer: Medicaid Other | Admitting: Family Medicine

## 2020-05-26 ENCOUNTER — Encounter: Payer: Self-pay | Admitting: Family Medicine

## 2020-05-26 ENCOUNTER — Other Ambulatory Visit: Payer: Self-pay

## 2020-05-26 VITALS — BP 122/93 | HR 98 | Temp 98.2°F | Ht 60.0 in | Wt 144.8 lb

## 2020-05-26 DIAGNOSIS — F419 Anxiety disorder, unspecified: Secondary | ICD-10-CM

## 2020-05-26 DIAGNOSIS — F1011 Alcohol abuse, in remission: Secondary | ICD-10-CM | POA: Diagnosis not present

## 2020-05-26 DIAGNOSIS — S6291XA Unspecified fracture of right wrist and hand, initial encounter for closed fracture: Secondary | ICD-10-CM

## 2020-05-26 DIAGNOSIS — R3915 Urgency of urination: Secondary | ICD-10-CM | POA: Diagnosis not present

## 2020-05-26 DIAGNOSIS — Z09 Encounter for follow-up examination after completed treatment for conditions other than malignant neoplasm: Secondary | ICD-10-CM

## 2020-05-26 DIAGNOSIS — M79671 Pain in right foot: Secondary | ICD-10-CM

## 2020-05-26 DIAGNOSIS — M79672 Pain in left foot: Secondary | ICD-10-CM

## 2020-05-26 DIAGNOSIS — D696 Thrombocytopenia, unspecified: Secondary | ICD-10-CM

## 2020-05-26 LAB — POCT URINALYSIS DIPSTICK
Bilirubin, UA: NEGATIVE
Blood, UA: NEGATIVE
Glucose, UA: NEGATIVE
Ketones, UA: NEGATIVE
Leukocytes, UA: NEGATIVE
Nitrite, UA: NEGATIVE
Protein, UA: NEGATIVE
Spec Grav, UA: 1.015 (ref 1.010–1.025)
Urobilinogen, UA: 1 E.U./dL
pH, UA: 6 (ref 5.0–8.0)

## 2020-05-26 LAB — POCT GLYCOSYLATED HEMOGLOBIN (HGB A1C)
HbA1c POC (<> result, manual entry): 4.9 % (ref 4.0–5.6)
HbA1c, POC (controlled diabetic range): 4.9 % (ref 0.0–7.0)
HbA1c, POC (prediabetic range): 4.9 % — AB (ref 5.7–6.4)
Hemoglobin A1C: 4.9 % (ref 4.0–5.6)

## 2020-05-26 LAB — GLUCOSE, POCT (MANUAL RESULT ENTRY): POC Glucose: 128 mg/dl — AB (ref 70–99)

## 2020-05-26 MED ORDER — DICLOFENAC SODIUM 1 % EX GEL: 4.0000 g | Freq: Four times a day (QID) | CUTANEOUS | 2 refills | Status: DC

## 2020-05-26 MED FILL — DICLOFENAC SODIUM 1 % GEL: 1 | 6 days supply | Qty: 100 | Fill #0

## 2020-05-26 NOTE — Progress Notes (Signed)
Patient Briana Sweeney and Briana Sweeney Follow Up   Subjective:  Patient ID: Briana Sweeney, female    DOB: December 20, 1970  Age: 49 y.o. MRN: [id number]  CC:  Chief Complaint  Patient presents with  . Foot Swelling    x23month R &L FOOT  . Foot Pain    X1 month R& L foot    HPI Briana Sweeney is a 49 year old female who presents for Sweeney Follow Up today.     Patient Active Problem List   Diagnosis Date Noted  . Left leg pain 03/28/2020  . History of recent fall 03/28/2020  . Abnormal urine 12/13/2019  . History of alcohol abuse 09/07/2019  . Insomnia 05/05/2019  . Dizziness 04/02/2019  . Bruising 04/02/2019  . Hyponatremia 04/02/2019  . Head injury   . Scalp laceration, initial encounter   . Fall 01/20/2019  . Epistaxis 01/20/2019  . Altered mental status   . 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 Other mixed anxiety disorders 10/27/2018  . Neuropathy 10/27/2018  . Abscess of bursa of left elbow 09/23/2018  . Olecranon bursitis of left elbow   . Erysipelas 09/13/2018  . Acute metabolic encephalopathy 56/86/1683  . Overdose 08/22/2018  . Hypotension 08/22/2018  . Seizure ([place]) 08/22/2018  . Substance induced mood disorder ([place]) 07/17/2018  . Cocaine dependence without complication ([place]) 72/90/2111  . Polysubstance abuse ([place]) 04/08/2018  . Encephalopathy, 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[place]) 04/08/2018  . Hepatitis C 03/18/2018  . Portal hypertension ([place]) 03/18/2018  . Alcoholic intoxication without complication ([place]) 55/20/8022  . Depression 03/18/2018  . Upper GI bleed 02/10/2018  . Alcohol use disorder, severe, in early remission ([place]) 02/10/2018  . Chronic anemia   . Thrombocytopenia due to drugs   . Suicidal ideation   . Thrombocytopenia ([place]) 01/02/2018  . GERD (gastroesophageal reflux disease) 11/01/2017  . Trichimoniasis 10/30/2017  . Alcohol dependence ([place]) 10/29/2017  . Acute hyperactive alcohol withdrawal delirium  ([place]) 10/09/2017  . Alcohol abuse with alcohol-induced mood disorder ([place])   . Alcohol withdrawal syndrome with complication ([place]) 33/61/2244  . Esophageal varices without bleeding ([place])   . Hematemesis 09/01/2017  . Ascites due to alcoholic cirrhosis ([place])   . Decompensated hepatic cirrhosis ([place]) 07/23/2017  . Alcohol abuse 07/23/2017  . Hepatitis C antibody positive in blood 07/23/2017  . Hypokalemia 07/23/2017  . Jaundice 07/23/2017  . Coagulopathy ([place]) 07/23/2017  . Hypomagnesemia 07/23/2017  . Pancytopenia ([place]) 07/23/2017  . Alcoholic cirrhosis of liver with ascites ([place]) 07/23/2017  . Bacterial vaginosis 06/03/2017  . UTI (urinary tract infection) 06/02/2017   Current Status: Since her last office visit, she is doing well with no complaints. She is currently receiving 1 unit of platelets every week for Thrombocytopenia at [place] [facility]. She denies easy bruising, petechiae (bleeding under skin), gingival bleeding (gums), epistaxis (nose bleeds), profuse bleeding from superficial cuts, menorrhagia (heavy/prolonged vaginal bleeding), mentrorrhagia (uterine bleeding), hematochezia (blood in stools), hematemesis (vomiting blood), and hematuria (blood in urine). She continues to see Psychiatry every 3 months. She suffered right hand fracture and completed surgery on 05/11/2020, which Dr. Amedeo Plenty prescribed her Vicodin #40 tablets on 05/25/2020. She arrives today with wrist/hand brace for support. She reports manageable pain level. She does report increased foot pain.  Patient also reports urinary urgency and often has accidents. She denies discharge, dysuria, urinary itching, burning, odor, hematuria, and suprapubic pain/discomfort. She denies dysuria and hematuria. She denies fevers, chills,  fatigue, recent infections, weight loss, and night sweats. She has not had any headaches, visual changes, dizziness, and falls. No chest pain, heart palpitations,  cough and shortness of breath reported. Denies GI problems such as nausea, vomiting, diarrhea, and constipation. She has no reports of blood in stools.  No depression or anxiety reported today. She is taking all medications as prescribed.   Past Medical History:  Diagnosis Date  . Alcohol use   . Anxiety   . Bipolar affective disorder ([place])    With anxiety features  . Bunionette of right foot 11/2019  . Cirrhosis of liver ([place])    Due to alcohol and hepatitis C  . Depression   . 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 ETOHism ([place])   . GERD (gastroesophageal reflux disease)   . Hematemesis 02/10/2018  . Hepatitis C 2018   hepatitis c and alcohol related hepatitis  . Heroin abuse ([place])   . History of blood transfusion    "blood doesn't clot; I fell down and had to have a transfusion"  . History of kidney stones   . History of thrombocytopenia   . Menopause 2016  . Migraine    "when I get really stressed" (09/01/2017)  . S/P foot surgery, right 11/2019  . Schizophrenia ([place])   . Seizures ([place])    "when I run out of my RX; lots recently" (09/01/2017)  . Thrombocytopenia ([place])   . Urinary urgency 05/2020    Past Surgical History:  Procedure Laterality Date  . ESOPHAGOGASTRODUODENOSCOPY N/A 09/03/2017   Procedure: ESOPHAGOGASTRODUODENOSCOPY (EGD);  Surgeon: Doran Stabler, MD;  Location: [facility];  Service: Gastroenterology;  Laterality: N/A;  . FINGER FRACTURE SURGERY Left    "shattered my pinky"  . FRACTURE SURGERY    . I & D EXTREMITY Left 09/18/2018   Procedure: IRRIGATION AND DEBRIDEMENT EXTREMITY;  Surgeon: Leanora Cover, MD;  Location: [facility];  Service: Orthopedics;  Laterality: Left;  . IR PARACENTESIS  07/23/2017  . IR PARACENTESIS  07/2017   "did it twice in the same week" (09/01/2017)  . SHOULDER OPEN ROTATOR CUFF REPAIR Right   . TOOTH EXTRACTION  08/2019  . TUBAL LIGATION    . VAGINAL HYSTERECTOMY      Family History  Problem Relation Age of Onset    . Lung cancer Mother 47  . Alcohol abuse Mother   . Throat cancer Father 69    Social History   Socioeconomic History  . Marital status: Divorced    Spouse name: Not on file  . Number of children: 2  . Years of education: Not on file  . Highest education level: Not on file  Occupational History  . Occupation: applying for disability  Tobacco Use  . Smoking status: Never Smoker  . Smokeless tobacco: Never Used  Vaping Use  . Vaping Use: Never used  Substance and Sexual Activity  . Alcohol use: Yes    Alcohol/week: 6.0 standard drinks    Types: 6 Cans of beer per week    Comment:  6 pack per week.  . Drug use: Not Currently    Comment: cocaiane not currently  . Sexual activity: Yes  Other Topics Concern  . Not on file  Social History Narrative   She moved with a boyfriend to [place] and was followed at [facility].  He died of a massive heart attack in 8/18, per her report, and so she moved back to [place] and is living with a  friend.      Right handed   2 story home    Lives alone 11/09/19   Social Determinants of Health   Financial Resource Strain:   . Difficulty of Paying Living Expenses: Not on file  Food Insecurity:   . Worried About Charity fundraiser in the Last Year: Not on file  . Ran Out of Food in the Last Year: Not on file  Transportation Needs:   . Lack of Transportation (Medical): Not on file  . Lack of Transportation (Non-Medical): Not on file  Physical Activity:   . Days of Exercise per Week: Not on file  . Minutes of Exercise per Session: Not on file  Stress:   . Feeling of Stress : Not on file  Social Connections:   . Frequency of Communication with Friends and Family: Not on file  . Frequency of Social Gatherings with Friends and Family: Not on file  . Attends Religious Services: Not on file  . Active Member of Clubs or Organizations: Not on file  . Attends Archivist Meetings: Not on file  . Marital Status: Not on file   Intimate Partner Violence:   . Fear of Current or Ex-Partner: Not on file  . Emotionally Abused: Not on file  . Physically Abused: Not on file  . Sexually Abused: Not on file    Outpatient Medications Prior to Visit  Medication Sig Dispense Refill  . [START ON 06/05/2020] ARIPiprazole (ABILIFY) 10 MG tablet Take 1 tablet (10 mg total) by mouth daily. 30 tablet 2  . clonazePAM (KLONOPIN) 1 MG tablet Take 1 tablet (1 mg total) by mouth 2 (two) times daily as needed for anxiety. 60 tablet 2  . [START ON 06/05/2020] FLUoxetine (PROZAC) 40 MG capsule Take 1 capsule (40 mg total) by mouth daily. 30 capsule 2  . furosemide (LASIX) 40 MG tablet Take 1 tablet (40 mg total) by mouth daily. 30 tablet 7  . gabapentin (NEURONTIN) 300 MG capsule Take 2 capsules (600 mg), by mouth, 3 times a day. 180 capsule 3  . lactulose (CHRONULAC) 10 GM/15ML solution Take 30 mLs (20 g total) by mouth 3 (three) times daily. Goal to have 2-3 BMs daily 240 mL 3  . levETIRAcetam (KEPPRA) 500 MG tablet Take 1 tablet (500 mg total) by mouth 2 (two) times daily. 180 tablet 3  . [START ON 06/05/2020] mirtazapine (REMERON) 30 MG tablet Take 1 tablet (30 mg total) by mouth at bedtime. 30 tablet 2  . Multiple Vitamin (MULTIVITAMIN WITH MINERALS) TABS tablet Take 1 tablet by mouth daily.    . polyethylene glycol (MIRALAX / GLYCOLAX) 17 g packet Take 17 g by mouth 2 (two) times daily. (Patient taking differently: Take 17 g by mouth daily as needed for mild constipation. ) 14 each 0  . hydrOXYzine (ATARAX/VISTARIL) 25 MG tablet Take 1 tablet (25 mg total) by mouth every 6 (six) hours as needed for itching. (Patient not taking: Reported on 05/17/2020) 90 tablet 1   No facility-administered medications prior to visit.    Allergies  Allergen Reactions  . Other Other (See Comments)    Platelets: Rx chest pain, tremors, body aches    ROS Review of Systems  Constitutional: Negative.   HENT: Negative.   Eyes: Negative.    Respiratory: Negative.   Cardiovascular: Negative.   Gastrointestinal: Positive for abdominal distention.  Endocrine: Negative.   Genitourinary: Positive for urgency (often).  Musculoskeletal: Positive for arthralgias (generalized joint pain).  Skin:  Negative.   Allergic/Immunologic: Negative.   Neurological: Positive for dizziness (occasional ), numbness (and pain in feet) and headaches (occasional ).  Hematological: Negative.   Psychiatric/Behavioral: Negative.    Objective:    Physical Exam Vitals and nursing note reviewed.  Constitutional:      Appearance: Normal appearance.  HENT:     Head: Normocephalic and atraumatic.     Nose: Nose normal.     Mouth/Throat:     Mouth: Mucous membranes are moist.     Pharynx: Oropharynx is clear.  Cardiovascular:     Rate and Rhythm: Normal rate and regular rhythm.     Pulses: Normal pulses.     Heart sounds: Normal heart sounds.  Pulmonary:     Effort: Pulmonary effort is normal.     Breath sounds: Normal breath sounds.  Abdominal:     General: Bowel sounds are normal. There is distension.     Palpations: Abdomen is soft.  Musculoskeletal:     Cervical back: Normal range of motion and neck supple.     Comments: Decreased ROM in right hand/arm  Skin:    General: Skin is warm and dry.  Neurological:     General: No focal deficit present.     Mental Status: She is alert and oriented to person, place, and time.  Psychiatric:        Mood and Affect: Mood normal.        Behavior: Behavior normal.        Thought Content: Thought content normal.        Judgment: Judgment normal.     BP (!) 122/93 (BP Location: Left Arm, Patient Position: Sitting, Cuff Size: Normal)   Pulse 98   Temp 98.2 F (36.8 C)   Ht 5' (1.524 m)   Wt 144 lb 12.8 oz (65.7 kg)   SpO2 96%   BMI 28.28 kg/m  Wt Readings from Last 3 Encounters:  05/26/20 144 lb 12.8 oz (65.7 kg)  05/17/20 147 lb 0.6 oz (66.7 kg)  05/10/20 141 lb 1.5 oz (64 kg)      Health Maintenance Due  Topic Date Due  . TETANUS/TDAP  Never done    There are no preventive care reminders to display for this patient.  Lab Results  Component Value Date   TSH 0.304 (L) 12/16/2019   Lab Results  Component Value Date   WBC 3.1 (L) 05/24/2020   HGB 14.6 05/24/2020   HCT 43.3 05/24/2020   MCV 91.7 05/24/2020   PLT 102 (L) 05/24/2020   Lab Results  Component Value Date   NA 142 05/24/2020   K 3.4 (L) 05/24/2020   CO2 30 05/24/2020   GLUCOSE 93 05/24/2020   BUN <5 (L) 05/24/2020   CREATININE 0.76 05/24/2020   BILITOT 1.0 05/24/2020   ALKPHOS 170 (H) 05/24/2020   AST 43 (H) 05/24/2020   ALT 18 05/24/2020   PROT 7.7 05/24/2020   ALBUMIN 4.0 05/24/2020   CALCIUM 9.6 05/24/2020   ANIONGAP 10 05/24/2020   Lab Results  Component Value Date   CHOL 213 (H) 12/13/2019   Lab Results  Component Value Date   HDL 108 12/13/2019   Lab Results  Component Value Date   LDLCALC 90 12/13/2019   Lab Results  Component Value Date   TRIG 85 12/13/2019   Lab Results  Component Value Date   CHOLHDL 2.0 12/13/2019   Lab Results  Component Value Date   HGBA1C 4.9 05/26/2020   HGBA1C  4.9 05/26/2020   HGBA1C 4.9 (A) 05/26/2020   HGBA1C 4.9 05/26/2020    Assessment & Plan:   1. Sweeney discharge follow-up - POC HgB A1c - POC Glucose (CBG) - Urinalysis Dipstick  2. Closed fracture of right hand, initial encounter Stable. Moderate pain. Healing well.   3. Foot pain, bilateral - diclofenac Sodium (VOLTAREN) 1 % GEL; Apply 4 g topically 4 (four) times daily.  Dispense: 100 g; Refill: 2  4. Thrombocytopenia ([place]) Her most recent platelet count was at 102 on 05/24/2018. She has bruising has multiple bruises over her today. She continues to deny petechiae, gingival bleeding, epistaxis, profuse bleeding from superficial cuts, melena, menorrhagia, mentrorrhagia, hematochezia, and hematuria. She states that her appetite is good. Denies abdominal pain,  nausea, vomiting, diarrhea, and constipation.   5. History of alcohol abuse  6. Urinary urgency  7. Anxiety Stable today.   8. Follow up She will follow up in 3 months.   Meds ordered this encounter  Medications  . diclofenac Sodium (VOLTAREN) 1 % GEL    Sig: Apply 4 g topically 4 (four) times daily.    Dispense:  100 g    Refill:  2    Orders Placed This Encounter  Procedures  . POC HgB A1c  . POC Glucose (CBG)  . Urinalysis Dipstick    Referral Orders  No referral(s) requested today    Kathe Becton,  MSN, FNP-BC [facility] [address] [place], [place] [address] [phone]- fax    Problem List Items Addressed This Visit      Other   History of alcohol abuse   Thrombocytopenia ([place])    Other Visit Diagnoses    Sweeney discharge follow-up    -  Primary   Relevant Orders   POC HgB A1c (Completed)   POC Glucose (CBG) (Completed)   Urinalysis Dipstick (Completed)   Closed fracture of right hand, initial encounter       Foot pain, bilateral       Relevant Medications   diclofenac Sodium (VOLTAREN) 1 % GEL   Urinary urgency       Anxiety       Follow up          Meds ordered this encounter  Medications  . diclofenac Sodium (VOLTAREN) 1 % GEL    Sig: Apply 4 g topically 4 (four) times daily.    Dispense:  100 g    Refill:  2    Follow-up: Return in about 3 months (around 08/25/2020).    [name], FNP

## 2020-05-29 ENCOUNTER — Encounter: Payer: Self-pay | Admitting: Family Medicine

## 2020-05-29 MED FILL — ARIPIPRAZOLE 10 MG TABS: 10 | 30 days supply | Qty: 30 | Fill #0

## 2020-05-30 ENCOUNTER — Telehealth: Payer: Self-pay | Admitting: Family Medicine

## 2020-05-30 MED FILL — LACTULOSE 10 GM/15 ML SOLN: 10 | 2 days supply | Qty: 240 | Fill #1

## 2020-05-30 MED FILL — MIRTAZAPINE 30 MG TABLET: 30 | 30 days supply | Qty: 30 | Fill #1

## 2020-05-31 ENCOUNTER — Inpatient Hospital Stay: Payer: Medicaid Other

## 2020-05-31 ENCOUNTER — Other Ambulatory Visit: Payer: Self-pay

## 2020-05-31 VITALS — BP 102/69 | HR 77 | Temp 98.8°F | Resp 18

## 2020-05-31 DIAGNOSIS — D696 Thrombocytopenia, unspecified: Secondary | ICD-10-CM

## 2020-05-31 DIAGNOSIS — D693 Immune thrombocytopenic purpura: Secondary | ICD-10-CM | POA: Diagnosis not present

## 2020-05-31 LAB — CBC WITH DIFFERENTIAL (CANCER CENTER ONLY)
Abs Immature Granulocytes: 0 10*3/uL (ref 0.00–0.07)
Basophils Absolute: 0 10*3/uL (ref 0.0–0.1)
Basophils Relative: 1 %
Eosinophils Absolute: 0.2 10*3/uL (ref 0.0–0.5)
Eosinophils Relative: 7 %
HCT: 42.1 % (ref 36.0–46.0)
Hemoglobin: 14.1 g/dL (ref 12.0–15.0)
Immature Granulocytes: 0 %
Lymphocytes Relative: 25 %
Lymphs Abs: 0.6 10*3/uL — ABNORMAL LOW (ref 0.7–4.0)
MCH: 31.4 pg (ref 26.0–34.0)
MCHC: 33.5 g/dL (ref 30.0–36.0)
MCV: 93.8 fL (ref 80.0–100.0)
Monocytes Absolute: 0.3 10*3/uL (ref 0.1–1.0)
Monocytes Relative: 13 %
Neutro Abs: 1.3 10*3/uL — ABNORMAL LOW (ref 1.7–7.7)
Neutrophils Relative %: 54 %
Platelet Count: 97 10*3/uL — ABNORMAL LOW (ref 150–400)
RBC: 4.49 MIL/uL (ref 3.87–5.11)
RDW: 15 % (ref 11.5–15.5)
WBC Count: 2.4 10*3/uL — ABNORMAL LOW (ref 4.0–10.5)
nRBC: 0 % (ref 0.0–0.2)

## 2020-05-31 LAB — PLATELET BY CITRATE

## 2020-05-31 LAB — CMP (CANCER CENTER ONLY)
ALT: 16 U/L (ref 0–44)
AST: 39 U/L (ref 15–41)
Albumin: 3.8 g/dL (ref 3.5–5.0)
Alkaline Phosphatase: 141 U/L — ABNORMAL HIGH (ref 38–126)
Anion gap: 10 (ref 5–15)
BUN: 5 mg/dL — ABNORMAL LOW (ref 6–20)
CO2: 26 mmol/L (ref 22–32)
Calcium: 9.5 mg/dL (ref 8.9–10.3)
Chloride: 106 mmol/L (ref 98–111)
Creatinine: 0.65 mg/dL (ref 0.44–1.00)
GFR, Est AFR Am: >60 mL/min (ref >60)
GFR, Estimated: >60 mL/min (ref >60)
Glucose, Bld: 70 mg/dL (ref 70–99)
Potassium: 3.6 mmol/L (ref 3.5–5.1)
Sodium: 142 mmol/L (ref 135–145)
Total Bilirubin: 0.8 mg/dL (ref 0.3–1.2)
Total Protein: 7.5 g/dL (ref 6.5–8.1)

## 2020-05-31 MED ORDER — ROMIPLOSTIM 250 MCG ~~LOC~~ SOLR
2.0000 ug/kg | Freq: Once | SUBCUTANEOUS | Status: AC
  Administered 2020-05-31: 130 ug via SUBCUTANEOUS
  Filled 2020-05-31: qty 0.26

## 2020-05-31 NOTE — Patient Instructions (Signed)
Romiplostim injection What is this medicine? ROMIPLOSTIM (roe mi PLOE stim) helps your body make more platelets. This medicine is used to treat low platelets caused by chronic idiopathic thrombocytopenic purpura (ITP). This medicine may be used for other purposes; ask your health care provider or pharmacist if you have questions. COMMON BRAND NAME(S): Nplate What should I tell my health care provider before I take this medicine? They need to know if you have any of these conditions:  bleeding disorders  bone marrow problem, like blood cancer or myelodysplastic syndrome  history of blood clots  liver disease  surgery to remove your spleen  an unusual or allergic reaction to romiplostim, mannitol, other medicines, foods, dyes, or preservatives  pregnant or trying to get pregnant  breast-feeding How should I use this medicine? This medicine is for injection under the skin. It is given by a health care professional in a hospital or clinic setting. A special MedGuide will be given to you before your injection. Read this information carefully each time. Talk to your pediatrician regarding the use of this medicine in children. While this drug may be prescribed for children as young as 1 year for selected conditions, precautions do apply. Overdosage: If you think you have taken too much of this medicine contact a poison control center or emergency room at once. NOTE: This medicine is only for you. Do not share this medicine with others. What if I miss a dose? It is important not to miss your dose. Call your doctor or health care professional if you are unable to keep an appointment. What may interact with this medicine? Interactions are not expected. This list may not describe all possible interactions. Give your health care provider a list of all the medicines, herbs, non-prescription drugs, or dietary supplements you use. Also tell them if you smoke, drink alcohol, or use illegal drugs.  Some items may interact with your medicine. What should I watch for while using this medicine? Your condition will be monitored carefully while you are receiving this medicine. Visit your prescriber or health care professional for regular checks on your progress and for the needed blood tests. It is important to keep all appointments. What side effects may I notice from receiving this medicine? Side effects that you should report to your doctor or health care professional as soon as possible:  allergic reactions like skin rash, itching or hives, swelling of the face, lips, or tongue  signs and symptoms of bleeding such as bloody or black, tarry stools; red or dark brown urine; spitting up blood or brown material that looks like coffee grounds; red spots on the skin; unusual bruising or bleeding from the eyes, gums, or nose  signs and symptoms of a blood clot such as chest pain; shortness of breath; pain, swelling, or warmth in the leg  signs and symptoms of a stroke like changes in vision; confusion; trouble speaking or understanding; severe headaches; sudden numbness or weakness of the face, arm or leg; trouble walking; dizziness; loss of balance or coordination Side effects that usually do not require medical attention (report to your doctor or health care professional if they continue or are bothersome):  headache  pain in arms and legs  pain in mouth  stomach pain This list may not describe all possible side effects. Call your doctor for medical advice about side effects. You may report side effects to FDA at 1-800-FDA-1088. Where should I keep my medicine? This drug is given in a hospital or clinic   and will not be stored at home. NOTE: This sheet is a summary. It may not cover all possible information. If you have questions about this medicine, talk to your doctor, pharmacist, or health care provider.  2020 Elsevier/Gold Standard (2017-08-25 11:10:55)  

## 2020-05-31 NOTE — Telephone Encounter (Signed)
Sent to provider 

## 2020-06-05 MED FILL — oxyCODONE HCL 5 MG TABS: 5 | 5 days supply | Qty: 30 | Fill #0

## 2020-06-07 ENCOUNTER — Inpatient Hospital Stay: Payer: Medicaid Other

## 2020-06-07 ENCOUNTER — Other Ambulatory Visit: Payer: Self-pay

## 2020-06-07 VITALS — BP 115/76 | HR 80 | Temp 98.3°F | Resp 18

## 2020-06-07 DIAGNOSIS — D61818 Other pancytopenia: Secondary | ICD-10-CM

## 2020-06-07 DIAGNOSIS — D696 Thrombocytopenia, unspecified: Secondary | ICD-10-CM

## 2020-06-07 DIAGNOSIS — D693 Immune thrombocytopenic purpura: Secondary | ICD-10-CM | POA: Diagnosis not present

## 2020-06-07 LAB — CBC WITH DIFFERENTIAL (CANCER CENTER ONLY)
Abs Immature Granulocytes: 0.01 10*3/uL (ref 0.00–0.07)
Basophils Absolute: 0 10*3/uL (ref 0.0–0.1)
Basophils Relative: 0 %
Eosinophils Absolute: 0.1 10*3/uL (ref 0.0–0.5)
Eosinophils Relative: 5 %
HCT: 44.1 % (ref 36.0–46.0)
Hemoglobin: 14.4 g/dL (ref 12.0–15.0)
Immature Granulocytes: 0 %
Lymphocytes Relative: 20 %
Lymphs Abs: 0.6 10*3/uL — ABNORMAL LOW (ref 0.7–4.0)
MCH: 31.1 pg (ref 26.0–34.0)
MCHC: 32.7 g/dL (ref 30.0–36.0)
MCV: 95.2 fL (ref 80.0–100.0)
Monocytes Absolute: 0.4 10*3/uL (ref 0.1–1.0)
Monocytes Relative: 15 %
Neutro Abs: 1.7 10*3/uL (ref 1.7–7.7)
Neutrophils Relative %: 60 %
Platelet Count: 67 10*3/uL — ABNORMAL LOW (ref 150–400)
RBC: 4.63 MIL/uL (ref 3.87–5.11)
RDW: 14.6 % (ref 11.5–15.5)
WBC Count: 2.9 10*3/uL — ABNORMAL LOW (ref 4.0–10.5)
nRBC: 0 % (ref 0.0–0.2)

## 2020-06-07 LAB — CMP (CANCER CENTER ONLY)
ALT: 20 U/L (ref 0–44)
AST: 35 U/L (ref 15–41)
Albumin: 3.8 g/dL (ref 3.5–5.0)
Alkaline Phosphatase: 155 U/L — ABNORMAL HIGH (ref 38–126)
Anion gap: 7 (ref 5–15)
BUN: 6 mg/dL (ref 6–20)
CO2: 27 mmol/L (ref 22–32)
Calcium: 10.2 mg/dL (ref 8.9–10.3)
Chloride: 106 mmol/L (ref 98–111)
Creatinine: 0.73 mg/dL (ref 0.44–1.00)
GFR, Est AFR Am: >60 mL/min (ref >60)
GFR, Estimated: >60 mL/min (ref >60)
Glucose, Bld: 99 mg/dL (ref 70–99)
Potassium: 4.3 mmol/L (ref 3.5–5.1)
Sodium: 140 mmol/L (ref 135–145)
Total Bilirubin: 1.2 mg/dL (ref 0.3–1.2)
Total Protein: 7.4 g/dL (ref 6.5–8.1)

## 2020-06-07 LAB — PLATELET BY CITRATE

## 2020-06-07 MED ORDER — ROMIPLOSTIM 250 MCG ~~LOC~~ SOLR
2.0000 ug/kg | Freq: Once | SUBCUTANEOUS | Status: AC
  Administered 2020-06-07: 130 ug via SUBCUTANEOUS
  Filled 2020-06-07: qty 0.26

## 2020-06-07 NOTE — Patient Instructions (Signed)
Romiplostim injection What is this medicine? ROMIPLOSTIM (roe mi PLOE stim) helps your body make more platelets. This medicine is used to treat low platelets caused by chronic idiopathic thrombocytopenic purpura (ITP). This medicine may be used for other purposes; ask your health care provider or pharmacist if you have questions. COMMON BRAND NAME(S): Nplate What should I tell my health care provider before I take this medicine? They need to know if you have any of these conditions:  bleeding disorders  bone marrow problem, like blood cancer or myelodysplastic syndrome  history of blood clots  liver disease  surgery to remove your spleen  an unusual or allergic reaction to romiplostim, mannitol, other medicines, foods, dyes, or preservatives  pregnant or trying to get pregnant  breast-feeding How should I use this medicine? This medicine is for injection under the skin. It is given by a health care professional in a hospital or clinic setting. A special MedGuide will be given to you before your injection. Read this information carefully each time. Talk to your pediatrician regarding the use of this medicine in children. While this drug may be prescribed for children as young as 1 year for selected conditions, precautions do apply. Overdosage: If you think you have taken too much of this medicine contact a poison control center or emergency room at once. NOTE: This medicine is only for you. Do not share this medicine with others. What if I miss a dose? It is important not to miss your dose. Call your doctor or health care professional if you are unable to keep an appointment. What may interact with this medicine? Interactions are not expected. This list may not describe all possible interactions. Give your health care provider a list of all the medicines, herbs, non-prescription drugs, or dietary supplements you use. Also tell them if you smoke, drink alcohol, or use illegal drugs.  Some items may interact with your medicine. What should I watch for while using this medicine? Your condition will be monitored carefully while you are receiving this medicine. Visit your prescriber or health care professional for regular checks on your progress and for the needed blood tests. It is important to keep all appointments. What side effects may I notice from receiving this medicine? Side effects that you should report to your doctor or health care professional as soon as possible:  allergic reactions like skin rash, itching or hives, swelling of the face, lips, or tongue  signs and symptoms of bleeding such as bloody or black, tarry stools; red or dark brown urine; spitting up blood or brown material that looks like coffee grounds; red spots on the skin; unusual bruising or bleeding from the eyes, gums, or nose  signs and symptoms of a blood clot such as chest pain; shortness of breath; pain, swelling, or warmth in the leg  signs and symptoms of a stroke like changes in vision; confusion; trouble speaking or understanding; severe headaches; sudden numbness or weakness of the face, arm or leg; trouble walking; dizziness; loss of balance or coordination Side effects that usually do not require medical attention (report to your doctor or health care professional if they continue or are bothersome):  headache  pain in arms and legs  pain in mouth  stomach pain This list may not describe all possible side effects. Call your doctor for medical advice about side effects. You may report side effects to FDA at 1-800-FDA-1088. Where should I keep my medicine? This drug is given in a hospital or clinic   and will not be stored at home. NOTE: This sheet is a summary. It may not cover all possible information. If you have questions about this medicine, talk to your doctor, pharmacist, or health care provider.  2020 Elsevier/Gold Standard (2017-08-25 11:10:55)  

## 2020-06-08 MED FILL — CLONAZEPAM 1 MG TABS: 1 | 30 days supply | Qty: 60 | Fill #1

## 2020-06-13 ENCOUNTER — Ambulatory Visit: Payer: Self-pay | Admitting: Family Medicine

## 2020-06-14 ENCOUNTER — Other Ambulatory Visit: Payer: Self-pay

## 2020-06-14 ENCOUNTER — Inpatient Hospital Stay: Payer: Medicaid Other | Attending: Hematology & Oncology

## 2020-06-14 ENCOUNTER — Inpatient Hospital Stay: Payer: Medicaid Other

## 2020-06-14 VITALS — BP 102/64 | HR 62 | Temp 97.9°F | Resp 18

## 2020-06-14 DIAGNOSIS — R002 Palpitations: Secondary | ICD-10-CM | POA: Insufficient documentation

## 2020-06-14 DIAGNOSIS — D696 Thrombocytopenia, unspecified: Secondary | ICD-10-CM

## 2020-06-14 DIAGNOSIS — D61818 Other pancytopenia: Secondary | ICD-10-CM

## 2020-06-14 DIAGNOSIS — D693 Immune thrombocytopenic purpura: Secondary | ICD-10-CM | POA: Diagnosis not present

## 2020-06-14 DIAGNOSIS — Z79899 Other long term (current) drug therapy: Secondary | ICD-10-CM | POA: Insufficient documentation

## 2020-06-14 LAB — CMP (CANCER CENTER ONLY)
ALT: 14 U/L (ref 0–44)
AST: 29 U/L (ref 15–41)
Albumin: 3.8 g/dL (ref 3.5–5.0)
Alkaline Phosphatase: 136 U/L — ABNORMAL HIGH (ref 38–126)
Anion gap: 7 (ref 5–15)
BUN: 6 mg/dL (ref 6–20)
CO2: 27 mmol/L (ref 22–32)
Calcium: 9.4 mg/dL (ref 8.9–10.3)
Chloride: 108 mmol/L (ref 98–111)
Creatinine: 0.67 mg/dL (ref 0.44–1.00)
GFR, Estimated: >60 mL/min (ref >60)
Glucose, Bld: 91 mg/dL (ref 70–99)
Potassium: 4.2 mmol/L (ref 3.5–5.1)
Sodium: 142 mmol/L (ref 135–145)
Total Bilirubin: 1.1 mg/dL (ref 0.3–1.2)
Total Protein: 7.2 g/dL (ref 6.5–8.1)

## 2020-06-14 LAB — CBC WITH DIFFERENTIAL (CANCER CENTER ONLY)
Abs Immature Granulocytes: 0.01 10*3/uL (ref 0.00–0.07)
Basophils Absolute: 0 10*3/uL (ref 0.0–0.1)
Basophils Relative: 1 %
Eosinophils Absolute: 0.1 10*3/uL (ref 0.0–0.5)
Eosinophils Relative: 7 %
HCT: 43 % (ref 36.0–46.0)
Hemoglobin: 14.2 g/dL (ref 12.0–15.0)
Immature Granulocytes: 1 %
Lymphocytes Relative: 25 %
Lymphs Abs: 0.5 10*3/uL — ABNORMAL LOW (ref 0.7–4.0)
MCH: 31.1 pg (ref 26.0–34.0)
MCHC: 33 g/dL (ref 30.0–36.0)
MCV: 94.1 fL (ref 80.0–100.0)
Monocytes Absolute: 0.3 10*3/uL (ref 0.1–1.0)
Monocytes Relative: 13 %
Neutro Abs: 1.1 10*3/uL — ABNORMAL LOW (ref 1.7–7.7)
Neutrophils Relative %: 53 %
Platelet Count: 80 10*3/uL — ABNORMAL LOW (ref 150–400)
RBC: 4.57 MIL/uL (ref 3.87–5.11)
RDW: 14.8 % (ref 11.5–15.5)
WBC Count: 2.1 10*3/uL — ABNORMAL LOW (ref 4.0–10.5)
nRBC: 0 % (ref 0.0–0.2)

## 2020-06-14 LAB — PLATELET BY CITRATE

## 2020-06-14 MED ORDER — ROMIPLOSTIM 250 MCG ~~LOC~~ SOLR
130.0000 ug | Freq: Once | SUBCUTANEOUS | Status: AC
  Administered 2020-06-14: 130 ug via SUBCUTANEOUS
  Filled 2020-06-14: qty 0.26

## 2020-06-14 MED FILL — FLUoxetine HCL 40 MG CAPS: 40 | 30 days supply | Qty: 30 | Fill #1

## 2020-06-14 NOTE — Patient Instructions (Signed)
Romiplostim injection What is this medicine? ROMIPLOSTIM (roe mi PLOE stim) helps your body make more platelets. This medicine is used to treat low platelets caused by chronic idiopathic thrombocytopenic purpura (ITP). This medicine may be used for other purposes; ask your health care provider or pharmacist if you have questions. COMMON BRAND NAME(S): Nplate What should I tell my health care provider before I take this medicine? They need to know if you have any of these conditions:  bleeding disorders  bone marrow problem, like blood cancer or myelodysplastic syndrome  history of blood clots  liver disease  surgery to remove your spleen  an unusual or allergic reaction to romiplostim, mannitol, other medicines, foods, dyes, or preservatives  pregnant or trying to get pregnant  breast-feeding How should I use this medicine? This medicine is for injection under the skin. It is given by a health care professional in a hospital or clinic setting. A special MedGuide will be given to you before your injection. Read this information carefully each time. Talk to your pediatrician regarding the use of this medicine in children. While this drug may be prescribed for children as young as 1 year for selected conditions, precautions do apply. Overdosage: If you think you have taken too much of this medicine contact a poison control center or emergency room at once. NOTE: This medicine is only for you. Do not share this medicine with others. What if I miss a dose? It is important not to miss your dose. Call your doctor or health care professional if you are unable to keep an appointment. What may interact with this medicine? Interactions are not expected. This list may not describe all possible interactions. Give your health care provider a list of all the medicines, herbs, non-prescription drugs, or dietary supplements you use. Also tell them if you smoke, drink alcohol, or use illegal drugs.  Some items may interact with your medicine. What should I watch for while using this medicine? Your condition will be monitored carefully while you are receiving this medicine. Visit your prescriber or health care professional for regular checks on your progress and for the needed blood tests. It is important to keep all appointments. What side effects may I notice from receiving this medicine? Side effects that you should report to your doctor or health care professional as soon as possible:  allergic reactions like skin rash, itching or hives, swelling of the face, lips, or tongue  signs and symptoms of bleeding such as bloody or black, tarry stools; red or dark brown urine; spitting up blood or brown material that looks like coffee grounds; red spots on the skin; unusual bruising or bleeding from the eyes, gums, or nose  signs and symptoms of a blood clot such as chest pain; shortness of breath; pain, swelling, or warmth in the leg  signs and symptoms of a stroke like changes in vision; confusion; trouble speaking or understanding; severe headaches; sudden numbness or weakness of the face, arm or leg; trouble walking; dizziness; loss of balance or coordination Side effects that usually do not require medical attention (report to your doctor or health care professional if they continue or are bothersome):  headache  pain in arms and legs  pain in mouth  stomach pain This list may not describe all possible side effects. Call your doctor for medical advice about side effects. You may report side effects to FDA at 1-800-FDA-1088. Where should I keep my medicine? This drug is given in a hospital or clinic   and will not be stored at home. NOTE: This sheet is a summary. It may not cover all possible information. If you have questions about this medicine, talk to your doctor, pharmacist, or health care provider.  2020 Elsevier/Gold Standard (2017-08-25 11:10:55)  

## 2020-06-15 ENCOUNTER — Telehealth (HOSPITAL_COMMUNITY): Payer: Self-pay | Admitting: *Deleted

## 2020-06-15 ENCOUNTER — Emergency Department (HOSPITAL_COMMUNITY): Payer: Medicaid Other

## 2020-06-15 ENCOUNTER — Emergency Department (HOSPITAL_COMMUNITY)
Admission: EM | Admit: 2020-06-15 | Discharge: 2020-06-15 | Disposition: A | Discharge status: Home / Self Care | Payer: Medicaid Other | Attending: Emergency Medicine | Admitting: Emergency Medicine

## 2020-06-15 ENCOUNTER — Encounter (HOSPITAL_COMMUNITY): Payer: Self-pay

## 2020-06-15 ENCOUNTER — Other Ambulatory Visit (HOSPITAL_COMMUNITY): Payer: Self-pay | Admitting: Psychiatry

## 2020-06-15 ENCOUNTER — Other Ambulatory Visit: Payer: Self-pay

## 2020-06-15 DIAGNOSIS — S62340D Nondisplaced fracture of base of second metacarpal bone, right hand, subsequent encounter for fracture with routine healing: Secondary | ICD-10-CM | POA: Diagnosis not present

## 2020-06-15 DIAGNOSIS — M79641 Pain in right hand: Secondary | ICD-10-CM | POA: Insufficient documentation

## 2020-06-15 DIAGNOSIS — Z79899 Other long term (current) drug therapy: Secondary | ICD-10-CM | POA: Diagnosis not present

## 2020-06-15 DIAGNOSIS — X501XXA Overexertion from prolonged static or awkward postures, initial encounter: Secondary | ICD-10-CM | POA: Insufficient documentation

## 2020-06-15 DIAGNOSIS — S62340G Nondisplaced fracture of base of second metacarpal bone, right hand, subsequent encounter for fracture with delayed healing: Secondary | ICD-10-CM

## 2020-06-15 MED ORDER — LORAZEPAM 2 MG PO TABS: 2.0000 mg | ORAL_TABLET | Freq: Three times a day (TID) | ORAL | 1 refills | Status: DC | PRN

## 2020-06-15 MED ORDER — OXYCODONE-ACETAMINOPHEN 5-325 MG PO TABS
1.0000 | ORAL_TABLET | Freq: Once | ORAL | Status: AC
  Administered 2020-06-15: 1 via ORAL
  Filled 2020-06-15: qty 1

## 2020-06-15 MED ORDER — HYDROCODONE-ACETAMINOPHEN 5-325 MG PO TABS: 1.0000 | ORAL_TABLET | Freq: Four times a day (QID) | ORAL | 0 refills | Status: DC | PRN

## 2020-06-15 MED FILL — LORazepam 2 MG TABS: 2 | 30 days supply | Qty: 90 | Fill #0

## 2020-06-15 NOTE — Telephone Encounter (Signed)
It is hard to say what will be "stronger" than 1 mg clonazepam. I ordered 2 mg of Ativan prn instead up to tid - as it is faster acting she may notice the effect more readily.

## 2020-06-15 NOTE — ED Notes (Signed)
No answer from pt when called for triage x 1.

## 2020-06-15 NOTE — ED Notes (Signed)
No answer when called for triage x2 

## 2020-06-15 NOTE — Progress Notes (Signed)
Orthopedic Tech Progress Note Patient Details:  Briana Sweeney February 16, 1971 [id number]  Ortho Devices Type of Ortho Device: Ace wrap, Volar splint Ortho Device/Splint Location: right Ortho Device/Splint Interventions: Application   Post Interventions Patient Tolerated: Well Instructions Provided: Care of device   [name] 06/15/2020, 6:31 PM

## 2020-06-15 NOTE — Telephone Encounter (Signed)
Patient called to report that her granddaughter had died and she is doing very poorly.  She requested something "stronger" for her anxiety. Please review.

## 2020-06-15 NOTE — Discharge Instructions (Signed)
You can take Tylenol or Ibuprofen as directed for pain. You can alternate Tylenol and Ibuprofen every 4 hours. If you take Tylenol at 1pm, then you can take Ibuprofen at 5pm. Then you can take Tylenol again at 9pm.   Take pain medications as directed for break through pain. Do not drive or operate machinery while taking this medication.   Follow-up with Dr. Amedeo Plenty or Dr. Jeannie Fend.  Call their office tomorrow to arrange follow-up.  Keep your splint on.  Do not get splint wet.  Return emergency department any worsening pain.

## 2020-06-15 NOTE — ED Notes (Signed)
Ortho tech consulted to apply volar splint

## 2020-06-15 NOTE — Telephone Encounter (Signed)
I called her to let her know.

## 2020-06-15 NOTE — ED Notes (Signed)
No answer when called for triage x 3.

## 2020-06-15 NOTE — ED Provider Notes (Signed)
[facility] DEPT Provider Note   CSN: [id number] Arrival date & time: 06/15/20  1513     History Chief Complaint  Patient presents with  . Hand Pain    Briana Sweeney is a 49 y.o. female who presents for evaluation of right hand pain.  She reports that a month ago, she fractured her right hand.  She has been followed by Dr. Amedeo Plenty (hand).  She reports that she has been in a splint and then was transitioned to a cast.  She reports that yesterday, she was picking up a chair and felt a pop and started having pain in her hand.  She ended up taking off her cast.  She states that she called Dr. Elveria Rising office but she was not able to be seen until next week so she came to the ED.  She denies any numbness/weakness.  The history is provided by the patient.       Past Medical History:  Diagnosis Date  . Alcohol use   . Anxiety   . Bipolar affective disorder ([place])    With anxiety features  . Bunionette of right foot 11/2019  . Cirrhosis of liver ([place])    Due to alcohol and hepatitis C  . Depression   . 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 ETOHism ([place])   . GERD (gastroesophageal reflux disease)   . Hematemesis 02/10/2018  . Hepatitis C 2018   hepatitis c and alcohol related hepatitis  . Heroin abuse ([place])   . History of blood transfusion    "blood doesn't clot; I fell down and had to have a transfusion"  . History of kidney stones   . History of thrombocytopenia   . Menopause 2016  . Migraine    "when I get really stressed" (09/01/2017)  . S/P foot surgery, right 11/2019  . Schizophrenia ([place])   . Seizures ([place])    "when I run out of my RX; lots recently" (09/01/2017)  . Thrombocytopenia ([place])   . Urinary urgency 05/2020    Patient Active Problem List   Diagnosis Date Noted  . Left leg pain 03/28/2020  . History of recent fall 03/28/2020  . Abnormal urine 12/13/2019  . History of alcohol abuse 09/07/2019  . Insomnia 05/05/2019  .  Dizziness 04/02/2019  . Bruising 04/02/2019  . Hyponatremia 04/02/2019  . Head injury   . Scalp laceration, initial encounter   . Fall 01/20/2019  . Epistaxis 01/20/2019  . Altered mental status   . 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 Other mixed anxiety disorders 10/27/2018  . Neuropathy 10/27/2018  . Abscess of bursa of left elbow 09/23/2018  . Olecranon bursitis of left elbow   . Erysipelas 09/13/2018  . Acute metabolic encephalopathy 41/93/7902  . Overdose 08/22/2018  . Hypotension 08/22/2018  . Seizure ([place]) 08/22/2018  . Substance induced mood disorder ([place]) 07/17/2018  . Cocaine dependence without complication ([place]) 40/97/3532  . Polysubstance abuse ([place]) 04/08/2018  . Encephalopathy, 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[place]) 04/08/2018  . Hepatitis C 03/18/2018  . Portal hypertension ([place]) 03/18/2018  . Alcoholic intoxication without complication ([place]) 99/24/2683  . Depression 03/18/2018  . Upper GI bleed 02/10/2018  . Alcohol use disorder, severe, in early remission ([place]) 02/10/2018  . Chronic anemia   . Thrombocytopenia due to drugs   . Suicidal ideation   . Thrombocytopenia ([place]) 01/02/2018  . GERD (gastroesophageal reflux disease) 11/01/2017  . Trichimoniasis 10/30/2017  . Alcohol dependence ([place]) 10/29/2017  . Acute hyperactive alcohol  withdrawal delirium ([place]) 10/09/2017  . Alcohol abuse with alcohol-induced mood disorder ([place])   . Alcohol withdrawal syndrome with complication ([place]) 89/21/1940  . Esophageal varices without bleeding ([place])   . Hematemesis 09/01/2017  . Ascites due to alcoholic cirrhosis ([place])   . Decompensated hepatic cirrhosis ([place]) 07/23/2017  . Alcohol abuse 07/23/2017  . Hepatitis C antibody positive in blood 07/23/2017  . Hypokalemia 07/23/2017  . Jaundice 07/23/2017  . Coagulopathy ([place]) 07/23/2017  . Hypomagnesemia 07/23/2017  . Pancytopenia ([place]) 07/23/2017  . Alcoholic cirrhosis of liver with ascites ([place]) 07/23/2017  . Bacterial vaginosis  06/03/2017  . UTI (urinary tract infection) 06/02/2017    Past Surgical History:  Procedure Laterality Date  . ESOPHAGOGASTRODUODENOSCOPY N/A 09/03/2017   Procedure: ESOPHAGOGASTRODUODENOSCOPY (EGD);  Surgeon: Doran Stabler, MD;  Location: [facility];  Service: Gastroenterology;  Laterality: N/A;  . FINGER FRACTURE SURGERY Left    "shattered my pinky"  . FRACTURE SURGERY    . I & D EXTREMITY Left 09/18/2018   Procedure: IRRIGATION AND DEBRIDEMENT EXTREMITY;  Surgeon: Leanora Cover, MD;  Location: [facility];  Service: Orthopedics;  Laterality: Left;  . IR PARACENTESIS  07/23/2017  . IR PARACENTESIS  07/2017   "did it twice in the same week" (09/01/2017)  . SHOULDER OPEN ROTATOR CUFF REPAIR Right   . TOOTH EXTRACTION  08/2019  . TUBAL LIGATION    . VAGINAL HYSTERECTOMY       OB History    Gravida  3   Para      Term      Preterm      AB      Living  2     SAB      TAB      Ectopic      Multiple      Live Births              Family History  Problem Relation Age of Onset  . Lung cancer Mother 67  . Alcohol abuse Mother   . Throat cancer Father 27    Social History   Tobacco Use  . Smoking status: Never Smoker  . Smokeless tobacco: Never Used  Vaping Use  . Vaping Use: Never used  Substance Use Topics  . Alcohol use: Yes    Alcohol/week: 6.0 standard drinks    Types: 6 Cans of beer per week    Comment:  6 pack per week.  . Drug use: Not Currently    Comment: cocaiane not currently    Home Medications Prior to Admission medications   Medication Sig Start Date End Date Taking? Authorizing Provider  ARIPiprazole (ABILIFY) 10 MG tablet Take 1 tablet (10 mg total) by mouth daily. 06/05/20 09/03/20  Pucilowski, Marchia Bond, MD  diclofenac Sodium (VOLTAREN) 1 % GEL Apply 4 g topically 4 (four) times daily. 05/26/20   [name], FNP  FLUoxetine (PROZAC) 40 MG capsule Take 1 capsule (40 mg total) by mouth daily. 06/05/20 09/03/20  Pucilowski,  Marchia Bond, MD  furosemide (LASIX) 40 MG tablet Take 1 tablet (40 mg total) by mouth daily. 12/13/19 08/09/20  [name], FNP  gabapentin (NEURONTIN) 300 MG capsule Take 2 capsules (600 mg), by mouth, 3 times a day. 01/04/20   [name], FNP  HYDROcodone-acetaminophen (NORCO/VICODIN) 5-325 MG tablet Take 1-2 tablets by mouth every 6 (six) hours as needed. 06/15/20   Volanda Napoleon, PA-C  hydrOXYzine (ATARAX/VISTARIL) 25 MG tablet Take 1 tablet (25 mg  total) by mouth every 6 (six) hours as needed for itching. Patient not taking: Reported on 05/17/2020 12/13/19   [name], FNP  lactulose (CHRONULAC) 10 GM/15ML solution Take 30 mLs (20 g total) by mouth 3 (three) times daily. Goal to have 2-3 BMs daily 05/10/20   Volanda Napoleon, MD  levETIRAcetam (KEPPRA) 500 MG tablet Take 1 tablet (500 mg total) by mouth 2 (two) times daily. 11/09/19   Cameron Sprang, MD  LORazepam (ATIVAN) 2 MG tablet Take 1 tablet (2 mg total) by mouth 3 (three) times daily as needed for anxiety. 06/15/20 08/14/20  Pucilowski, Marchia Bond, MD  mirtazapine (REMERON) 30 MG tablet Take 1 tablet (30 mg total) by mouth at bedtime. 06/05/20 09/03/20  Pucilowski, Marchia Bond, MD  Multiple Vitamin (MULTIVITAMIN WITH MINERALS) TABS tablet Take 1 tablet by mouth daily. 04/13/19   Hosie Poisson, MD  polyethylene glycol (MIRALAX / GLYCOLAX) 17 g packet Take 17 g by mouth 2 (two) times daily. Patient taking differently: Take 17 g by mouth daily as needed for mild constipation.  04/13/19   Hosie Poisson, MD    Allergies    Other  Review of Systems   Review of Systems  Musculoskeletal:       Right hand pain  Neurological: Negative for weakness and numbness.  All other systems reviewed and are negative.   Physical Exam Updated Vital Signs BP 140/80 (BP Location: Right Arm)   Pulse 75   Temp 98.6 F (37 C) (Oral)   Resp 15   SpO2 100%   Physical Exam Vitals and nursing note reviewed.  Constitutional:      Appearance: She  is well-developed.  HENT:     Head: Normocephalic and atraumatic.  Eyes:     General: No scleral icterus.       Right eye: No discharge.        Left eye: No discharge.     Conjunctiva/sclera: Conjunctivae normal.  Cardiovascular:     Pulses:          Radial pulses are 2+ on the right side and 2+ on the left side.  Pulmonary:     Effort: Pulmonary effort is normal.  Musculoskeletal:     Comments: Tenderness palpation the dorsal aspect of the right hand, positive snuffbox tenderness.  No deformity or crepitus noted.  Limited range of motion secondary to pain.  She can wiggle all 5 digits with any difficulty.  Compartments are soft.  Skin:    General: Skin is warm and dry.     Capillary Refill: Capillary refill takes less than 2 seconds.     Comments: Good distal cap refill.  RUE is not dusky in appearance or cool to touch.  Neurological:     Mental Status: She is alert.  Psychiatric:        Speech: Speech normal.        Behavior: Behavior normal.     ED Results / Procedures / Treatments   Labs (all labs ordered are listed, but only abnormal results are displayed) Labs Reviewed - No data to display  EKG None  Radiology DG Hand Complete Right  Result Date: 06/15/2020 CLINICAL DATA:  49 year old female with right hand pain. Recent fracture of the second metacarpal. EXAM: RIGHT HAND - COMPLETE 3+ VIEW COMPARISON:  Right hand radiograph dated 04/25/2020. FINDINGS: There is fracture of the base of the second metacarpal with resorption along the fracture line and no significant healing. There may be extension of  the fracture to the articular base of the second metacarpal. No new fracture. There is no dislocation. The bones are osteopenic. There is irregularity and narrowing of the PIP joint of the fifth digit with foreshortening of the fifth digit similar to prior radiograph and possibly related to prior fracture. The soft tissues are grossly unremarkable. IMPRESSION: 1. No new fracture.  2. Fracture of the base of the second metacarpal with no healing since the prior radiograph. 3. Osteopenia. Electronically Signed   By: Anner Crete M.D.   On: 06/15/2020 16:57    Procedures Procedures (including critical care time)  Medications Ordered in ED Medications  oxyCODONE-acetaminophen (PERCOCET/ROXICET) 5-325 MG per tablet 1 tablet (1 tablet Oral Given 06/15/20 1833)    ED Course  I have reviewed the triage vital signs and the nursing notes.  Pertinent labs & imaging results that were available during my care of the patient were reviewed by me and considered in my medical decision making (see chart for details).    MDM Rules/Calculators/A&P                          49 year old female who presents for evaluation of right hand pain.  Recent fracture right hand seen by hand.  Picked up a chair yesterday and started having worsening pain and took her splint off.  Comes in today for evaluation.  On initial arrival, she is afebrile, toxic appearing.  Vital signs are stable.  She is neurovascularly intact.  Consider worsening of pre-existing fracture versus sprain.  History/physical exam is not concerning for compartment syndrome.  Plan for x-rays.  X-ray reviewed.  No new fracture.  Fracture of the base of the second metacarpal with no healing since prior radiograph.  It does look a little bit wider based on my assessment of it.  There is mention that it may have some extension into the articular base.  Discussed with Dr. Jeannie Fend (hand).  He recommends placing patient in a volar splint and obtaining a CT scan.  Patient can follow-up with either him or Dr. Amedeo Plenty.  He recommends she call the office tomorrow.  Reevaluation after splint placement.  Patient with good distal cap refill, sensation.  She can wiggle all 5 digits.  She is requesting for pain medication to go home with.  I reviewed her records.  She was given a course of pain medication by Dr. Phillip Heal but on 05/25/2020.  She  does have a high narcotic score that is contributed from clonazepam that she gets prescribed.  She has not had any other recent narcotic prescriptions.  Given acute pain for fracture, will give her short course of pain medication until she is able to be seen by Dr. Amedeo Plenty. At this time, patient exhibits no emergent life-threatening condition that require further evaluation in ED. Patient had ample opportunity for questions and discussion. All patient's questions were answered with full understanding. Strict return precautions discussed. Patient expresses understanding and agreement to plan.   Portions of this note were generated with Lobbyist. Dictation errors may occur despite best attempts at proofreading.   Final Clinical Impression(s) / ED Diagnoses Final diagnoses:  Closed nondisplaced fracture of base of second metacarpal bone of right hand with delayed healing, subsequent encounter    Rx / DC Orders ED Discharge Orders         Ordered    HYDROcodone-acetaminophen (NORCO/VICODIN) 5-325 MG tablet  Every 6 hours PRN  06/15/20 1837           Volanda Napoleon, PA-C 06/15/20 Lilli Light, MD 06/15/20 2312

## 2020-06-15 NOTE — ED Triage Notes (Signed)
Pt presents with c/o right hand pain. Pt reports that she broke her hand a couple of months ago and yesterday, she heard a pop when she was moving something with her hand.

## 2020-06-16 ENCOUNTER — Other Ambulatory Visit (HOSPITAL_COMMUNITY): Payer: Self-pay | Admitting: Physician Assistant

## 2020-06-16 MED FILL — oxyCODONE HCL 5 MG TABS: 5 | 3 days supply | Qty: 9 | Fill #0

## 2020-06-20 ENCOUNTER — Other Ambulatory Visit: Payer: Self-pay | Admitting: Neurology

## 2020-06-20 ENCOUNTER — Ambulatory Visit (INDEPENDENT_AMBULATORY_CARE_PROVIDER_SITE_OTHER): Payer: Medicaid Other | Admitting: Neurology

## 2020-06-20 ENCOUNTER — Other Ambulatory Visit: Payer: Self-pay

## 2020-06-20 ENCOUNTER — Encounter: Payer: Self-pay | Admitting: Neurology

## 2020-06-20 VITALS — BP 106/72 | HR 68 | Ht 60.0 in | Wt 146.6 lb

## 2020-06-20 DIAGNOSIS — G40309 Generalized idiopathic epilepsy and epileptic syndromes, not intractable, without status epilepticus: Secondary | ICD-10-CM

## 2020-06-20 MED ORDER — LEVETIRACETAM 500 MG PO TABS: 500.0000 mg | ORAL_TABLET | Freq: Two times a day (BID) | ORAL | 3 refills | Status: DC

## 2020-06-20 MED FILL — levETIRAcetam 500 MG TABS: 500 | 90 days supply | Qty: 180 | Fill #0

## 2020-06-20 NOTE — Progress Notes (Signed)
NEUROLOGY FOLLOW UP OFFICE NOTE  Briana Sweeney [id number] 12/31/1970  HISTORY OF PRESENT ILLNESS: I had the pleasure of seeing Briana Sweeney in follow-up in the neurology clinic on 06/20/2020.  The patient was last seen 7 months ago for epilepsy suggestive of primary generalized epilepsy. She is alone in the office today. Records and images were personally reviewed where available.  Since her last visit, she has been to the ER twice for alcohol intoxication. Last alcohol intake was a week ago. She reports a seizure last week, she could feel it coming on, her eyes were dilated and she felt weird. She lay on the floor and passed out for maybe a minute, she bit her tongue. She missed her Keppra dose that evening. She is taking Keppra 500mg  BID. She reports an incident in April when she had thrombocytopenia and had a reaction during platelet infusion, she began violently shaking with chest pain and generalized body aches. No loss of consciousness reported. She reports 2 incidents where she woke up from dead sleep due to a sudden loud boom of bright light. There was no associated headache, confusion, motor activity, she went back to sleep after. They have not occurred during the day. She also has episodes where she is walking then her eyes shut down on her, she could not see anything, her eyes are closed and they burn for 5-10 minutes. This is associated with a headache, no confusion or speech difficulties. She has been told she would stare sometimes, she lives alone. She is thinking of getting a service dog for the seizures and her vision. She has had minor falls due to poor balance.   History on Initial Assessment 11/09/2019: This is a pleasant 49 year old right-handed woman with a history of cirrhosis secondary to alcohol abuse and hepatitis C, schizophrenia, anxiety, presenting for evaluation of seizures. She has not seen a neurologist in many years, her last neurologist was in [place]. Seizures started  around age 49 or 49 where she would have "silent seizures." She would start feeling nervous with hot/cold flashes. Her eyes would just go blind and dilated after, she would feel weird for 2 days. She has had a couple of convulsions in the past 5 years, last was in July 2020. She lives alone and friends have mentioned staring spells. She has occasional twitching and may drop something in her hand. She used to have nocturnal seizures and wake up with a bloody pillow. She had been on Phenytoin for many years but continued to have seizures once a month. Phenytoin would interact with hepatitis C medication, so she was switched to Levetiracetam 500mg  BID last October  2020. She denies any seizures since starting LEV. She denies any olfactory/gustatory hallucinations, rising epigastric sensation, focal numbness/tingling/weakness. She used to have headaches but these have decreased. She gets dizzy when her platelet count goes low. She has occasional neck/back pain, occasional numbness in both hands. No diplopia, dysarthria/dysphagia, bowel/bladder dysfunction. Her right foot is in a boot s/p bunion removal. 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 She recalls a fall 4 months ago when her platelets went down and she fell forward, hitting her face. She states she quit drinking 4 months ago. Of note, there is an ER visit on 11/01/19 where she reported last drink was day prior. She states her memory is "half/half." Sleep is good.   Epilepsy Risk Factors:  She had febrile seizures in  childhood. Otherwise she had a normal birth and early development.  There is no history of CNS infections such as meningitis/encephalitis, significant traumatic brain injury, neurosurgical procedures, or family history of seizures.  Diagnostic Data: MRI brain without contrast done 03/2019 no acute changes, there was brain atrophy, premature for age, mild T1 hyperintensity in the basal ganglia  likely related to chronic alcohol use.  Prior AEDs: Phenytoin  PAST MEDICAL HISTORY: Past Medical History:  Diagnosis Date  . Alcohol use   . Anxiety   . Bipolar affective disorder ([place])    With anxiety features  . Bunionette of right foot 11/2019  . Cirrhosis of liver ([place])    Due to alcohol and hepatitis C  . Depression   . 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 ETOHism ([place])   . GERD (gastroesophageal reflux disease)   . Hematemesis 02/10/2018  . Hepatitis C 2018   hepatitis c and alcohol related hepatitis  . Heroin abuse ([place])   . History of blood transfusion    "blood doesn't clot; I fell down and had to have a transfusion"  . History of kidney stones   . History of thrombocytopenia   . Menopause 2016  . Migraine    "when I get really stressed" (09/01/2017)  . S/P foot surgery, right 11/2019  . Schizophrenia ([place])   . Seizures ([place])    "when I run out of my RX; lots recently" (09/01/2017)  . Thrombocytopenia ([place])   . Urinary urgency 05/2020    MEDICATIONS: Current Outpatient Medications on File Prior to Visit  Medication Sig Dispense Refill  . ARIPiprazole (ABILIFY) 10 MG tablet Take 1 tablet (10 mg total) by mouth daily. 30 tablet 2  . diclofenac Sodium (VOLTAREN) 1 % GEL Apply 4 g topically 4 (four) times daily. 100 g 2  . FLUoxetine (PROZAC) 40 MG capsule Take 1 capsule (40 mg total) by mouth daily. 30 capsule 2  . furosemide (LASIX) 40 MG tablet Take 1 tablet (40 mg total) by mouth daily. 30 tablet 7  . gabapentin (NEURONTIN) 300 MG capsule Take 2 capsules (600 mg), by mouth, 3 times a day. 180 capsule 3  . lactulose (CHRONULAC) 10 GM/15ML solution Take 30 mLs (20 g total) by mouth 3 (three) times daily. Goal to have 2-3 BMs daily 240 mL 3  . levETIRAcetam (KEPPRA) 500 MG tablet Take 1 tablet (500 mg total) by mouth 2 (two) times daily. 180 tablet 3  . LORazepam (ATIVAN) 2 MG tablet Take 1 tablet (2 mg total) by mouth 3 (three) times daily as needed for  anxiety. 90 tablet 1  . mirtazapine (REMERON) 30 MG tablet Take 1 tablet (30 mg total) by mouth at bedtime. 30 tablet 2  . Multiple Vitamin (MULTIVITAMIN WITH MINERALS) TABS tablet Take 1 tablet by mouth daily.    . polyethylene glycol (MIRALAX / GLYCOLAX) 17 g packet Take 17 g by mouth 2 (two) times daily. (Patient taking differently: Take 17 g by mouth daily as needed for mild constipation. ) 14 each 0   No current facility-administered medications on file prior to visit.    ALLERGIES: Allergies  Allergen Reactions  . Other Other (See Comments)    Platelets: Rx chest pain, tremors, body aches    FAMILY HISTORY: Family History  Problem Relation Age of Onset  . Lung cancer Mother 66  . Alcohol abuse Mother   . Throat cancer Father 57    SOCIAL HISTORY: Social History   Socioeconomic History  .  Marital status: Divorced    Spouse name: Not on file  . Number of children: 2  . Years of education: Not on file  . Highest education level: Not on file  Occupational History  . Occupation: applying for disability  Tobacco Use  . Smoking status: Never Smoker  . Smokeless tobacco: Never Used  Vaping Use  . Vaping Use: Never used  Substance and Sexual Activity  . Alcohol use: Yes    Alcohol/week: 6.0 standard drinks    Types: 6 Cans of beer per week    Comment:  6 pack per week.  . Drug use: Not Currently    Comment: cocaiane not currently  . Sexual activity: Yes  Other Topics Concern  . Not on file  Social History Narrative   She moved with a boyfriend to [place] and was followed at [facility].  He died of a massive heart attack in 8/18, per her report, and so she moved back to [place] and is living with a friend.      Right handed   2 story home    Lives alone 11/09/19   Social Determinants of Health   Financial Resource Strain:   . Difficulty of Paying Living Expenses: Not on file  Food Insecurity:   . Worried About Charity fundraiser in the Last Year:  Not on file  . Ran Out of Food in the Last Year: Not on file  Transportation Needs:   . Lack of Transportation (Medical): Not on file  . Lack of Transportation (Non-Medical): Not on file  Physical Activity:   . Days of Exercise per Week: Not on file  . Minutes of Exercise per Session: Not on file  Stress:   . Feeling of Stress : Not on file  Social Connections:   . Frequency of Communication with Friends and Family: Not on file  . Frequency of Social Gatherings with Friends and Family: Not on file  . Attends Religious Services: Not on file  . Active Member of Clubs or Organizations: Not on file  . Attends Archivist Meetings: Not on file  . Marital Status: Not on file  Intimate Partner Violence:   . Fear of Current or Ex-Partner: Not on file  . Emotionally Abused: Not on file  . Physically Abused: Not on file  . Sexually Abused: Not on file     PHYSICAL EXAM: Vitals:   06/20/20 0815  BP: 106/72  Pulse: 68  SpO2: 97%   General: No acute distress Head:  Normocephalic/atraumatic Skin/Extremities: No rash, no edema. Right wrist in cast Neurological Exam: alert and oriented to person, place, and time. No aphasia or dysarthria. Fund of knowledge is appropriate.  Recent and remote memory are intact.  Attention and concentration are normal.   Cranial nerves: Pupils equal, round. Extraocular movements intact with no nystagmus. Visual fields full.  No facial asymmetry.  Motor: Bulk and tone normal, muscle strength 5/5 throughout with no pronator drift (unable to test right hand due to cast). Finger to nose testing intact.  Gait narrow-based and steady, no ataxia.    IMPRESSION: This is a 49 yo RH woman with a history of cirrhosis secondary to alcohol abuse and hepatitis C, schizophrenia, anxiety, and seizures since childhood with staring spells and GTCs suggestive of primary generalized epilepsy. She is on Levetiracetam 500mg  BID and had one seizure in the past 7 months due to  missing a dose of LEV. We again discussed avoidance of seizure triggers,  including missing medication, sleep deprivation, and alcohol. She reports last alcohol intake was a week ago. Last seizure a week ago. Continue Levetiracetam 500mg  BID for now, low threshold to increase dose. We again discussed [place] driving laws to stop driving after a seizure until 6 months seizure-free. She is interested in getting a service dog, resources provided. Follow-up in 6 months, she knows to call for any changes.   Thank you for allowing me to participate in her care.  Please do not hesitate to call for any questions or concerns.   Ellouise Newer, M.D.   CC: Kathe Becton, FNP

## 2020-06-20 NOTE — Patient Instructions (Signed)
1. Continue Keppra 500mg twice a day  2. Follow-up in 6 months, call for any changes   Seizure Precautions: 1. If medication has been prescribed for you to prevent seizures, take it exactly as directed.  Do not stop taking the medicine without talking to your doctor first, even if you have not had a seizure in a long time.   2. Avoid activities in which a seizure would cause danger to yourself or to others.  Don't operate dangerous machinery, swim alone, or climb in high or dangerous places, such as on ladders, roofs, or girders.  Do not drive unless your doctor says you may.  3. If you have any warning that you may have a seizure, lay down in a safe place where you can't hurt yourself.    4.  No driving for 6 months from last seizure, as per  state law.   Please refer to the following link on the [employer]'s website for more information: http://www.epilepsyfoundation.org/answerplace/Social/driving/drivingu.cfm   5.  Maintain good sleep hygiene. Avoid alcohol.  6.  Contact your doctor if you have any problems that may be related to the medicine you are taking.  7.  Call 911 and bring the patient back to the ED if:        A.  The seizure lasts longer than 5 minutes.       B.  The patient doesn't awaken shortly after the seizure  C.  The patient has new problems such as difficulty seeing, speaking or moving  D.  The patient was injured during the seizure  E.  The patient has a temperature over 102 F (39C)  F.  The patient vomited and now is having trouble breathing         

## 2020-06-21 ENCOUNTER — Inpatient Hospital Stay: Payer: Medicaid Other

## 2020-06-21 VITALS — BP 100/64 | HR 61 | Temp 98.4°F

## 2020-06-21 DIAGNOSIS — D696 Thrombocytopenia, unspecified: Secondary | ICD-10-CM

## 2020-06-21 DIAGNOSIS — D61818 Other pancytopenia: Secondary | ICD-10-CM

## 2020-06-21 DIAGNOSIS — D693 Immune thrombocytopenic purpura: Secondary | ICD-10-CM | POA: Diagnosis not present

## 2020-06-21 LAB — CBC WITH DIFFERENTIAL (CANCER CENTER ONLY)
Abs Immature Granulocytes: 0 10*3/uL (ref 0.00–0.07)
Basophils Absolute: 0 10*3/uL (ref 0.0–0.1)
Basophils Relative: 0 %
Eosinophils Absolute: 0.2 10*3/uL (ref 0.0–0.5)
Eosinophils Relative: 6 %
HCT: 43.7 % (ref 36.0–46.0)
Hemoglobin: 14.3 g/dL (ref 12.0–15.0)
Immature Granulocytes: 0 %
Lymphocytes Relative: 17 %
Lymphs Abs: 0.5 10*3/uL — ABNORMAL LOW (ref 0.7–4.0)
MCH: 31 pg (ref 26.0–34.0)
MCHC: 32.7 g/dL (ref 30.0–36.0)
MCV: 94.8 fL (ref 80.0–100.0)
Monocytes Absolute: 0.3 10*3/uL (ref 0.1–1.0)
Monocytes Relative: 11 %
Neutro Abs: 1.8 10*3/uL (ref 1.7–7.7)
Neutrophils Relative %: 66 %
Platelet Count: 79 10*3/uL — ABNORMAL LOW (ref 150–400)
RBC: 4.61 MIL/uL (ref 3.87–5.11)
RDW: 15.1 % (ref 11.5–15.5)
WBC Count: 2.8 10*3/uL — ABNORMAL LOW (ref 4.0–10.5)
nRBC: 0 % (ref 0.0–0.2)

## 2020-06-21 LAB — CMP (CANCER CENTER ONLY)
ALT: 15 U/L (ref 0–44)
AST: 28 U/L (ref 15–41)
Albumin: 3.8 g/dL (ref 3.5–5.0)
Alkaline Phosphatase: 137 U/L — ABNORMAL HIGH (ref 38–126)
Anion gap: 6 (ref 5–15)
BUN: 8 mg/dL (ref 6–20)
CO2: 29 mmol/L (ref 22–32)
Calcium: 9.8 mg/dL (ref 8.9–10.3)
Chloride: 106 mmol/L (ref 98–111)
Creatinine: 0.68 mg/dL (ref 0.44–1.00)
GFR, Estimated: >60 mL/min (ref >60)
Glucose, Bld: 99 mg/dL (ref 70–99)
Potassium: 4.6 mmol/L (ref 3.5–5.1)
Sodium: 141 mmol/L (ref 135–145)
Total Bilirubin: 1.1 mg/dL (ref 0.3–1.2)
Total Protein: 7 g/dL (ref 6.5–8.1)

## 2020-06-21 LAB — PLATELET BY CITRATE

## 2020-06-21 MED ORDER — ROMIPLOSTIM 250 MCG ~~LOC~~ SOLR
130.0000 ug | Freq: Once | SUBCUTANEOUS | Status: AC
  Administered 2020-06-21: 130 ug via SUBCUTANEOUS
  Filled 2020-06-21: qty 0.26

## 2020-06-21 NOTE — Patient Instructions (Signed)
Romiplostim injection (Nplate) What is this medicine? ROMIPLOSTIM (roe mi PLOE stim) helps your body make more platelets. This medicine is used to treat low platelets caused by chronic idiopathic thrombocytopenic purpura (ITP). This medicine may be used for other purposes; ask your health care provider or pharmacist if you have questions. COMMON BRAND NAME(S): Nplate What should I tell my health care provider before I take this medicine? They need to know if you have any of these conditions:  bleeding disorders  bone marrow problem, like blood cancer or myelodysplastic syndrome  history of blood clots  liver disease  surgery to remove your spleen  an unusual or allergic reaction to romiplostim, mannitol, other medicines, foods, dyes, or preservatives  pregnant or trying to get pregnant  breast-feeding How should I use this medicine? This medicine is for injection under the skin. It is given by a health care professional in a hospital or clinic setting. A special MedGuide will be given to you before your injection. Read this information carefully each time. Talk to your pediatrician regarding the use of this medicine in children. While this drug may be prescribed for children as young as 1 year for selected conditions, precautions do apply. Overdosage: If you think you have taken too much of this medicine contact a poison control center or emergency room at once. NOTE: This medicine is only for you. Do not share this medicine with others. What if I miss a dose? It is important not to miss your dose. Call your doctor or health care professional if you are unable to keep an appointment. What may interact with this medicine? Interactions are not expected. This list may not describe all possible interactions. Give your health care provider a list of all the medicines, herbs, non-prescription drugs, or dietary supplements you use. Also tell them if you smoke, drink alcohol, or use illegal  drugs. Some items may interact with your medicine. What should I watch for while using this medicine? Your condition will be monitored carefully while you are receiving this medicine. Visit your prescriber or health care professional for regular checks on your progress and for the needed blood tests. It is important to keep all appointments. What side effects may I notice from receiving this medicine? Side effects that you should report to your doctor or health care professional as soon as possible:  allergic reactions like skin rash, itching or hives, swelling of the face, lips, or tongue  signs and symptoms of bleeding such as bloody or black, tarry stools; red or dark brown urine; spitting up blood or brown material that looks like coffee grounds; red spots on the skin; unusual bruising or bleeding from the eyes, gums, or nose  signs and symptoms of a blood clot such as chest pain; shortness of breath; pain, swelling, or warmth in the leg  signs and symptoms of a stroke like changes in vision; confusion; trouble speaking or understanding; severe headaches; sudden numbness or weakness of the face, arm or leg; trouble walking; dizziness; loss of balance or coordination Side effects that usually do not require medical attention (report to your doctor or health care professional if they continue or are bothersome):  headache  pain in arms and legs  pain in mouth  stomach pain This list may not describe all possible side effects. Call your doctor for medical advice about side effects. You may report side effects to FDA at 1-800-FDA-1088. Where should I keep my medicine? This drug is given in a hospital or   clinic and will not be stored at home. NOTE: This sheet is a summary. It may not cover all possible information. If you have questions about this medicine, talk to your doctor, pharmacist, or health care provider.  2020 Elsevier/Gold Standard (2017-08-25 11:10:55)  

## 2020-06-26 MED FILL — ARIPIPRAZOLE 10 MG TABS: 10 | 30 days supply | Qty: 30 | Fill #1

## 2020-06-28 ENCOUNTER — Inpatient Hospital Stay (HOSPITAL_BASED_OUTPATIENT_CLINIC_OR_DEPARTMENT_OTHER): Payer: Medicaid Other | Admitting: Family

## 2020-06-28 ENCOUNTER — Telehealth (HOSPITAL_COMMUNITY): Payer: Self-pay | Admitting: *Deleted

## 2020-06-28 ENCOUNTER — Other Ambulatory Visit (HOSPITAL_COMMUNITY): Payer: Self-pay | Admitting: Psychiatry

## 2020-06-28 ENCOUNTER — Encounter: Payer: Self-pay | Admitting: Family

## 2020-06-28 ENCOUNTER — Other Ambulatory Visit: Payer: Self-pay

## 2020-06-28 ENCOUNTER — Inpatient Hospital Stay: Payer: Medicaid Other

## 2020-06-28 ENCOUNTER — Telehealth: Payer: Self-pay | Admitting: Family

## 2020-06-28 VITALS — BP 101/77 | HR 96 | Temp 98.9°F | Resp 18 | Ht 60.0 in | Wt 144.0 lb

## 2020-06-28 DIAGNOSIS — D696 Thrombocytopenia, unspecified: Secondary | ICD-10-CM

## 2020-06-28 DIAGNOSIS — D693 Immune thrombocytopenic purpura: Secondary | ICD-10-CM | POA: Diagnosis not present

## 2020-06-28 DIAGNOSIS — D61818 Other pancytopenia: Secondary | ICD-10-CM

## 2020-06-28 LAB — CBC WITH DIFFERENTIAL (CANCER CENTER ONLY)
Abs Immature Granulocytes: 0.01 10*3/uL (ref 0.00–0.07)
Basophils Absolute: 0 10*3/uL (ref 0.0–0.1)
Basophils Relative: 0 %
Eosinophils Absolute: 0.2 10*3/uL (ref 0.0–0.5)
Eosinophils Relative: 5 %
HCT: 42.6 % (ref 36.0–46.0)
Hemoglobin: 14.1 g/dL (ref 12.0–15.0)
Immature Granulocytes: 0 %
Lymphocytes Relative: 15 %
Lymphs Abs: 0.5 10*3/uL — ABNORMAL LOW (ref 0.7–4.0)
MCH: 31 pg (ref 26.0–34.0)
MCHC: 33.1 g/dL (ref 30.0–36.0)
MCV: 93.6 fL (ref 80.0–100.0)
Monocytes Absolute: 0.5 10*3/uL (ref 0.1–1.0)
Monocytes Relative: 13 %
Neutro Abs: 2.2 10*3/uL (ref 1.7–7.7)
Neutrophils Relative %: 67 %
Platelet Count: 97 10*3/uL — ABNORMAL LOW (ref 150–400)
RBC: 4.55 MIL/uL (ref 3.87–5.11)
RDW: 15.2 % (ref 11.5–15.5)
WBC Count: 3.4 10*3/uL — ABNORMAL LOW (ref 4.0–10.5)
nRBC: 0 % (ref 0.0–0.2)

## 2020-06-28 LAB — CMP (CANCER CENTER ONLY)
ALT: 20 U/L (ref 0–44)
AST: 41 U/L (ref 15–41)
Albumin: 3.8 g/dL (ref 3.5–5.0)
Alkaline Phosphatase: 168 U/L — ABNORMAL HIGH (ref 38–126)
Anion gap: 7 (ref 5–15)
BUN: 5 mg/dL — ABNORMAL LOW (ref 6–20)
CO2: 29 mmol/L (ref 22–32)
Calcium: 9.6 mg/dL (ref 8.9–10.3)
Chloride: 106 mmol/L (ref 98–111)
Creatinine: 0.67 mg/dL (ref 0.44–1.00)
GFR, Estimated: >60 mL/min (ref >60)
Glucose, Bld: 86 mg/dL (ref 70–99)
Potassium: 4 mmol/L (ref 3.5–5.1)
Sodium: 142 mmol/L (ref 135–145)
Total Bilirubin: 0.9 mg/dL (ref 0.3–1.2)
Total Protein: 7.3 g/dL (ref 6.5–8.1)

## 2020-06-28 LAB — PLATELET BY CITRATE

## 2020-06-28 MED ORDER — ROMIPLOSTIM 250 MCG ~~LOC~~ SOLR
2.0000 ug/kg | Freq: Once | SUBCUTANEOUS | Status: AC
  Administered 2020-06-28: 130 ug via SUBCUTANEOUS
  Filled 2020-06-28: qty 0.26

## 2020-06-28 NOTE — Telephone Encounter (Signed)
I will discuss the issue of getting prescriptions for similar controlled medications from different providers at our next meeting.

## 2020-06-28 NOTE — Progress Notes (Signed)
Hematology and Oncology Follow Up Visit  Briana Sweeney [id number] [birth year] 49 y.o. 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 Wike is here today for follow-up. Unfortunately her 28 month old granddaughter passed away unexpectedly and this has caused a lot of stress on both she and her family. She states that she sees her counselor and plans to discuss her feelings of stress and medications with her.  No issues with bleeding. No abnormal bruising or petechiae.  She still has a cast on the right arms. She notes puffiness and tingling off and on in her right hand.  No fever, chills, n/v, cough, rash, dizziness, SOB, chest pain, abdominal pain or changes in bowel or bladder habits.  She has occasional palpitations.  No falls or syncopal episodes to report.  She states that she has maintained an appetite and is staying well hydrated. Her weight is stable at 144 lbs.   ECOG Performance Status: 1 - Symptomatic but completely ambulatory  Medications:  Allergies as of 06/28/2020      Reactions   Other Other (See Comments)   Platelets: Rx chest pain, tremors, body aches      Medication List       Accurate as of June 28, 2020 10:49 AM. If you have any questions, ask your nurse or doctor.        ARIPiprazole 10 MG tablet Commonly known as: ABILIFY Take 1 tablet (10 mg total) by mouth daily.   clonazePAM 1 MG tablet Commonly known as: KLONOPIN Take 1 mg by mouth in the morning, at noon, and at bedtime.   diclofenac Sodium 1 % Gel Commonly known as: VOLTAREN Apply 4 g topically 4 (four) times daily.   FLUoxetine 40 MG capsule Commonly known as: PROZAC Take 1 capsule (40 mg total) by mouth daily.   furosemide 40 MG tablet Commonly known as: Lasix Take 1 tablet (40 mg total) by mouth daily.   gabapentin 300 MG capsule Commonly  known as: NEURONTIN Take 2 capsules (600 mg), by mouth, 3 times a day.   lactulose 10 GM/15ML solution Commonly known as: CHRONULAC Take 30 mLs (20 g total) by mouth 3 (three) times daily. Goal to have 2-3 BMs daily   levETIRAcetam 500 MG tablet Commonly known as: Keppra Take 1 tablet (500 mg total) by mouth 2 (two) times daily.   LORazepam 2 MG tablet Commonly known as: ATIVAN Take 1 tablet (2 mg total) by mouth 3 (three) times daily as needed for anxiety.   mirtazapine 30 MG tablet Commonly known as: REMERON Take 1 tablet (30 mg total) by mouth at bedtime.   multivitamin with minerals Tabs tablet Take 1 tablet by mouth daily.   polyethylene glycol 17 g packet Commonly known as: MIRALAX / GLYCOLAX Take 17 g by mouth 2 (two) times daily. What changed:   when to take this  reasons to take this       Allergies:  Allergies  Allergen Reactions  . Other Other (See Comments)    Platelets: Rx chest pain, tremors, body aches    Past Medical History, Surgical history, Social history, and Family History were reviewed and updated.  Review of Systems: All other 10 point review of systems is negative.   Physical Exam:  height is 5' (1.524 m) and weight is 144 lb (65.3 kg). Her oral temperature is 98.9 F (37.2 C). Her blood  pressure is 101/77 and her pulse is 96. Her respiration is 18 and oxygen saturation is 98%.   Wt Readings from Last 3 Encounters:  06/28/20 144 lb (65.3 kg)  06/20/20 146 lb 9.6 oz (66.5 kg)  05/26/20 144 lb 12.8 oz (65.7 kg)    Ocular: Sclerae unicteric, pupils equal, round and reactive to light Ear-nose-throat: Oropharynx clear, dentition fair Lymphatic: No cervical or supraclavicular adenopathy Lungs no rales or rhonchi, good excursion bilaterally Heart regular rate and rhythm, no murmur appreciated Abd soft, nontender, positive bowel sounds, no liver or spleen tip palpated on exam, no fluid wave  MSK no focal spinal tenderness, no joint  edema Neuro: non-focal, well-oriented, appropriate affect Breasts: Deferred   Lab Results  Component Value Date   WBC 3.4 (L) 06/28/2020   HGB 14.1 06/28/2020   HCT 42.6 06/28/2020   MCV 93.6 06/28/2020   PLT 97 (L) 06/28/2020   No results found for: FERRITIN, IRON, TIBC, UIBC, IRONPCTSAT Lab Results  Component Value Date   RBC 4.55 06/28/2020   No results found for: KPAFRELGTCHN, LAMBDASER, KAPLAMBRATIO No results found for: IGGSERUM, IGA, IGMSERUM No results found for: Odetta Pink, SPEI   Chemistry      Component Value Date/Time   NA 142 06/28/2020 1001   NA 142 01/04/2020 1510   K 4.0 06/28/2020 1001   CL 106 06/28/2020 1001   CO2 29 06/28/2020 1001   BUN 5 (L) 06/28/2020 1001   BUN 6 01/04/2020 1510   CREATININE 0.67 06/28/2020 1001   CREATININE 0.54 05/01/2020 1043      Component Value Date/Time   CALCIUM 9.6 06/28/2020 1001   ALKPHOS 168 (H) 06/28/2020 1001   AST 41 06/28/2020 1001   ALT 20 06/28/2020 1001   BILITOT 0.9 06/28/2020 1001       Impression and Plan: Ms. 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 We will proceed with Nplate today for platelet count 97.  We will continue her weekly injections and follow-up in 8 weeks.  She was encouraged to contact our office with any questions or concerns.   Laverna Peace, NP 10/20/202110:49 AM

## 2020-06-28 NOTE — Telephone Encounter (Signed)
Writer received a call from Cristy Friedlander PhD @ [facility] who is concerned that  pt is using multiple benzos and opioids, including going to the ED for pain meds despite having just refilled a script, and that pt has no Naltrexone. Just a heads up from her. Please review.

## 2020-06-28 NOTE — Telephone Encounter (Signed)
Patient has been prescribed lorazepam as a substitute for clonazepam (she may have some clonazepam leftover pills though). I am not aware of her being prescribed narcotic analgesics - none are currently on her medication chart in Epic.

## 2020-06-28 NOTE — Telephone Encounter (Signed)
Yes in the med history. And she refilled klonopin soon after your script by another provider.

## 2020-06-28 NOTE — Telephone Encounter (Signed)
I called and LMVM for patient with updated appointments.  Calendar was also mailed to her as well

## 2020-06-28 NOTE — Patient Instructions (Signed)
Romiplostim injection What is this medicine? ROMIPLOSTIM (roe mi PLOE stim) helps your body make more platelets. This medicine is used to treat low platelets caused by chronic idiopathic thrombocytopenic purpura (ITP). This medicine may be used for other purposes; ask your health care provider or pharmacist if you have questions. COMMON BRAND NAME(S): Nplate What should I tell my health care provider before I take this medicine? They need to know if you have any of these conditions:  bleeding disorders  bone marrow problem, like blood cancer or myelodysplastic syndrome  history of blood clots  liver disease  surgery to remove your spleen  an unusual or allergic reaction to romiplostim, mannitol, other medicines, foods, dyes, or preservatives  pregnant or trying to get pregnant  breast-feeding How should I use this medicine? This medicine is for injection under the skin. It is given by a health care professional in a hospital or clinic setting. A special MedGuide will be given to you before your injection. Read this information carefully each time. Talk to your pediatrician regarding the use of this medicine in children. While this drug may be prescribed for children as young as 1 year for selected conditions, precautions do apply. Overdosage: If you think you have taken too much of this medicine contact a poison control center or emergency room at once. NOTE: This medicine is only for you. Do not share this medicine with others. What if I miss a dose? It is important not to miss your dose. Call your doctor or health care professional if you are unable to keep an appointment. What may interact with this medicine? Interactions are not expected. This list may not describe all possible interactions. Give your health care provider a list of all the medicines, herbs, non-prescription drugs, or dietary supplements you use. Also tell them if you smoke, drink alcohol, or use illegal drugs.  Some items may interact with your medicine. What should I watch for while using this medicine? Your condition will be monitored carefully while you are receiving this medicine. Visit your prescriber or health care professional for regular checks on your progress and for the needed blood tests. It is important to keep all appointments. What side effects may I notice from receiving this medicine? Side effects that you should report to your doctor or health care professional as soon as possible:  allergic reactions like skin rash, itching or hives, swelling of the face, lips, or tongue  signs and symptoms of bleeding such as bloody or black, tarry stools; red or dark brown urine; spitting up blood or brown material that looks like coffee grounds; red spots on the skin; unusual bruising or bleeding from the eyes, gums, or nose  signs and symptoms of a blood clot such as chest pain; shortness of breath; pain, swelling, or warmth in the leg  signs and symptoms of a stroke like changes in vision; confusion; trouble speaking or understanding; severe headaches; sudden numbness or weakness of the face, arm or leg; trouble walking; dizziness; loss of balance or coordination Side effects that usually do not require medical attention (report to your doctor or health care professional if they continue or are bothersome):  headache  pain in arms and legs  pain in mouth  stomach pain This list may not describe all possible side effects. Call your doctor for medical advice about side effects. You may report side effects to FDA at 1-800-FDA-1088. Where should I keep my medicine? This drug is given in a hospital or clinic   and will not be stored at home. NOTE: This sheet is a summary. It may not cover all possible information. If you have questions about this medicine, talk to your doctor, pharmacist, or health care provider.  2020 Elsevier/Gold Standard (2017-08-25 11:10:55)  

## 2020-07-05 ENCOUNTER — Inpatient Hospital Stay: Payer: Medicaid Other

## 2020-07-05 ENCOUNTER — Other Ambulatory Visit: Payer: Self-pay

## 2020-07-05 VITALS — BP 100/66 | HR 73 | Temp 98.2°F | Resp 17

## 2020-07-05 DIAGNOSIS — D693 Immune thrombocytopenic purpura: Secondary | ICD-10-CM | POA: Diagnosis not present

## 2020-07-05 DIAGNOSIS — D696 Thrombocytopenia, unspecified: Secondary | ICD-10-CM

## 2020-07-05 DIAGNOSIS — D61818 Other pancytopenia: Secondary | ICD-10-CM

## 2020-07-05 LAB — CBC WITH DIFFERENTIAL (CANCER CENTER ONLY)
Abs Immature Granulocytes: 0 10*3/uL (ref 0.00–0.07)
Basophils Absolute: 0 10*3/uL (ref 0.0–0.1)
Basophils Relative: 1 %
Eosinophils Absolute: 0.2 10*3/uL (ref 0.0–0.5)
Eosinophils Relative: 7 %
HCT: 44.2 % (ref 36.0–46.0)
Hemoglobin: 14.4 g/dL (ref 12.0–15.0)
Immature Granulocytes: 0 %
Lymphocytes Relative: 22 %
Lymphs Abs: 0.5 10*3/uL — ABNORMAL LOW (ref 0.7–4.0)
MCH: 30.8 pg (ref 26.0–34.0)
MCHC: 32.6 g/dL (ref 30.0–36.0)
MCV: 94.4 fL (ref 80.0–100.0)
Monocytes Absolute: 0.5 10*3/uL (ref 0.1–1.0)
Monocytes Relative: 23 %
Neutro Abs: 1.1 10*3/uL — ABNORMAL LOW (ref 1.7–7.7)
Neutrophils Relative %: 47 %
Platelet Count: 80 10*3/uL — ABNORMAL LOW (ref 150–400)
RBC: 4.68 MIL/uL (ref 3.87–5.11)
RDW: 15.9 % — ABNORMAL HIGH (ref 11.5–15.5)
WBC Count: 2.2 10*3/uL — ABNORMAL LOW (ref 4.0–10.5)
nRBC: 0 % (ref 0.0–0.2)

## 2020-07-05 LAB — CMP (CANCER CENTER ONLY)
ALT: 19 U/L (ref 0–44)
AST: 36 U/L (ref 15–41)
Albumin: 3.6 g/dL (ref 3.5–5.0)
Alkaline Phosphatase: 128 U/L — ABNORMAL HIGH (ref 38–126)
Anion gap: 7 (ref 5–15)
BUN: 7 mg/dL (ref 6–20)
CO2: 26 mmol/L (ref 22–32)
Calcium: 9.7 mg/dL (ref 8.9–10.3)
Chloride: 107 mmol/L (ref 98–111)
Creatinine: 0.63 mg/dL (ref 0.44–1.00)
GFR, Estimated: >60 mL/min (ref >60)
Glucose, Bld: 82 mg/dL (ref 70–99)
Potassium: 4 mmol/L (ref 3.5–5.1)
Sodium: 140 mmol/L (ref 135–145)
Total Bilirubin: 1.2 mg/dL (ref 0.3–1.2)
Total Protein: 7.1 g/dL (ref 6.5–8.1)

## 2020-07-05 LAB — PLATELET BY CITRATE

## 2020-07-05 MED ORDER — ROMIPLOSTIM 250 MCG ~~LOC~~ SOLR
2.0000 ug/kg | Freq: Once | SUBCUTANEOUS | Status: AC
  Administered 2020-07-05: 130 ug via SUBCUTANEOUS
  Filled 2020-07-05: qty 0.26

## 2020-07-05 NOTE — Patient Instructions (Signed)
Romiplostim injection (Nplate) What is this medicine? ROMIPLOSTIM (roe mi PLOE stim) helps your body make more platelets. This medicine is used to treat low platelets caused by chronic idiopathic thrombocytopenic purpura (ITP). This medicine may be used for other purposes; ask your health care provider or pharmacist if you have questions. COMMON BRAND NAME(S): Nplate What should I tell my health care provider before I take this medicine? They need to know if you have any of these conditions:  bleeding disorders  bone marrow problem, like blood cancer or myelodysplastic syndrome  history of blood clots  liver disease  surgery to remove your spleen  an unusual or allergic reaction to romiplostim, mannitol, other medicines, foods, dyes, or preservatives  pregnant or trying to get pregnant  breast-feeding How should I use this medicine? This medicine is for injection under the skin. It is given by a health care professional in a hospital or clinic setting. A special MedGuide will be given to you before your injection. Read this information carefully each time. Talk to your pediatrician regarding the use of this medicine in children. While this drug may be prescribed for children as young as 1 year for selected conditions, precautions do apply. Overdosage: If you think you have taken too much of this medicine contact a poison control center or emergency room at once. NOTE: This medicine is only for you. Do not share this medicine with others. What if I miss a dose? It is important not to miss your dose. Call your doctor or health care professional if you are unable to keep an appointment. What may interact with this medicine? Interactions are not expected. This list may not describe all possible interactions. Give your health care provider a list of all the medicines, herbs, non-prescription drugs, or dietary supplements you use. Also tell them if you smoke, drink alcohol, or use illegal  drugs. Some items may interact with your medicine. What should I watch for while using this medicine? Your condition will be monitored carefully while you are receiving this medicine. Visit your prescriber or health care professional for regular checks on your progress and for the needed blood tests. It is important to keep all appointments. What side effects may I notice from receiving this medicine? Side effects that you should report to your doctor or health care professional as soon as possible:  allergic reactions like skin rash, itching or hives, swelling of the face, lips, or tongue  signs and symptoms of bleeding such as bloody or black, tarry stools; red or dark brown urine; spitting up blood or brown material that looks like coffee grounds; red spots on the skin; unusual bruising or bleeding from the eyes, gums, or nose  signs and symptoms of a blood clot such as chest pain; shortness of breath; pain, swelling, or warmth in the leg  signs and symptoms of a stroke like changes in vision; confusion; trouble speaking or understanding; severe headaches; sudden numbness or weakness of the face, arm or leg; trouble walking; dizziness; loss of balance or coordination Side effects that usually do not require medical attention (report to your doctor or health care professional if they continue or are bothersome):  headache  pain in arms and legs  pain in mouth  stomach pain This list may not describe all possible side effects. Call your doctor for medical advice about side effects. You may report side effects to FDA at 1-800-FDA-1088. Where should I keep my medicine? This drug is given in a hospital or  clinic and will not be stored at home. NOTE: This sheet is a summary. It may not cover all possible information. If you have questions about this medicine, talk to your doctor, pharmacist, or health care provider.  2020 Elsevier/Gold Standard (2017-08-25 11:10:55)

## 2020-07-06 MED FILL — CLONAZEPAM 1 MG TABS: 1 | 30 days supply | Qty: 60 | Fill #2

## 2020-07-07 MED FILL — GABAPENTIN 300 MG CAPSULE: 300 | 30 days supply | Qty: 180 | Fill #3

## 2020-07-10 ENCOUNTER — Other Ambulatory Visit (HOSPITAL_COMMUNITY): Payer: Self-pay | Admitting: Family Medicine

## 2020-07-10 MED FILL — MIRTAZAPINE 30 MG TABLET: 30 | 30 days supply | Qty: 30 | Fill #1

## 2020-07-10 MED FILL — NITROFURANTOIN MONO-MCR 100: 100 | 7 days supply | Qty: 14 | Fill #0

## 2020-07-10 MED FILL — FLUoxetine HCL 40 MG CAPS: 40 | 30 days supply | Qty: 30 | Fill #2

## 2020-07-12 ENCOUNTER — Inpatient Hospital Stay: Payer: Medicaid Other

## 2020-07-12 ENCOUNTER — Other Ambulatory Visit: Payer: Self-pay

## 2020-07-12 ENCOUNTER — Inpatient Hospital Stay: Payer: Medicaid Other | Attending: Hematology & Oncology

## 2020-07-12 VITALS — BP 107/69 | HR 68 | Temp 97.9°F | Resp 17

## 2020-07-12 DIAGNOSIS — D693 Immune thrombocytopenic purpura: Secondary | ICD-10-CM | POA: Insufficient documentation

## 2020-07-12 DIAGNOSIS — D696 Thrombocytopenia, unspecified: Secondary | ICD-10-CM

## 2020-07-12 DIAGNOSIS — D61818 Other pancytopenia: Secondary | ICD-10-CM

## 2020-07-12 LAB — CBC WITH DIFFERENTIAL (CANCER CENTER ONLY)
Abs Immature Granulocytes: 0 10*3/uL (ref 0.00–0.07)
Basophils Absolute: 0 10*3/uL (ref 0.0–0.1)
Basophils Relative: 1 %
Eosinophils Absolute: 0.2 10*3/uL (ref 0.0–0.5)
Eosinophils Relative: 4 %
HCT: 43 % (ref 36.0–46.0)
Hemoglobin: 14.1 g/dL (ref 12.0–15.0)
Immature Granulocytes: 0 %
Lymphocytes Relative: 14 %
Lymphs Abs: 0.5 10*3/uL — ABNORMAL LOW (ref 0.7–4.0)
MCH: 31 pg (ref 26.0–34.0)
MCHC: 32.8 g/dL (ref 30.0–36.0)
MCV: 94.5 fL (ref 80.0–100.0)
Monocytes Absolute: 0.5 10*3/uL (ref 0.1–1.0)
Monocytes Relative: 13 %
Neutro Abs: 2.3 10*3/uL (ref 1.7–7.7)
Neutrophils Relative %: 68 %
Platelet Count: 104 10*3/uL — ABNORMAL LOW (ref 150–400)
RBC: 4.55 MIL/uL (ref 3.87–5.11)
RDW: 15.2 % (ref 11.5–15.5)
WBC Count: 3.4 10*3/uL — ABNORMAL LOW (ref 4.0–10.5)
nRBC: 0 % (ref 0.0–0.2)

## 2020-07-12 LAB — CMP (CANCER CENTER ONLY)
ALT: 18 U/L (ref 0–44)
AST: 33 U/L (ref 15–41)
Albumin: 3.6 g/dL (ref 3.5–5.0)
Alkaline Phosphatase: 155 U/L — ABNORMAL HIGH (ref 38–126)
Anion gap: 5 (ref 5–15)
BUN: 12 mg/dL (ref 6–20)
CO2: 29 mmol/L (ref 22–32)
Calcium: 9.9 mg/dL (ref 8.9–10.3)
Chloride: 107 mmol/L (ref 98–111)
Creatinine: 0.65 mg/dL (ref 0.44–1.00)
GFR, Estimated: >60 mL/min (ref >60)
Glucose, Bld: 98 mg/dL (ref 70–99)
Potassium: 4.4 mmol/L (ref 3.5–5.1)
Sodium: 141 mmol/L (ref 135–145)
Total Bilirubin: 0.8 mg/dL (ref 0.3–1.2)
Total Protein: 6.9 g/dL (ref 6.5–8.1)

## 2020-07-12 LAB — PLATELET BY CITRATE

## 2020-07-12 MED ORDER — ROMIPLOSTIM 250 MCG ~~LOC~~ SOLR
130.0000 ug | Freq: Once | SUBCUTANEOUS | Status: AC
  Administered 2020-07-12: 130 ug via SUBCUTANEOUS
  Filled 2020-07-12: qty 0.26

## 2020-07-12 NOTE — Progress Notes (Signed)
Pt discharged in no apparent distress. Pt left ambulatory without assistance. Pt aware of discharge instructions and verbalized understanding and had no further questions.  

## 2020-07-12 NOTE — Patient Instructions (Signed)
Romiplostim injection What is this medicine? ROMIPLOSTIM (roe mi PLOE stim) helps your body make more platelets. This medicine is used to treat low platelets caused by chronic idiopathic thrombocytopenic purpura (ITP). This medicine may be used for other purposes; ask your health care provider or pharmacist if you have questions. COMMON BRAND NAME(S): Nplate What should I tell my health care provider before I take this medicine? They need to know if you have any of these conditions:  bleeding disorders  bone marrow problem, like blood cancer or myelodysplastic syndrome  history of blood clots  liver disease  surgery to remove your spleen  an unusual or allergic reaction to romiplostim, mannitol, other medicines, foods, dyes, or preservatives  pregnant or trying to get pregnant  breast-feeding How should I use this medicine? This medicine is for injection under the skin. It is given by a health care professional in a hospital or clinic setting. A special MedGuide will be given to you before your injection. Read this information carefully each time. Talk to your pediatrician regarding the use of this medicine in children. While this drug may be prescribed for children as young as 1 year for selected conditions, precautions do apply. Overdosage: If you think you have taken too much of this medicine contact a poison control center or emergency room at once. NOTE: This medicine is only for you. Do not share this medicine with others. What if I miss a dose? It is important not to miss your dose. Call your doctor or health care professional if you are unable to keep an appointment. What may interact with this medicine? Interactions are not expected. This list may not describe all possible interactions. Give your health care provider a list of all the medicines, herbs, non-prescription drugs, or dietary supplements you use. Also tell them if you smoke, drink alcohol, or use illegal drugs.  Some items may interact with your medicine. What should I watch for while using this medicine? Your condition will be monitored carefully while you are receiving this medicine. Visit your prescriber or health care professional for regular checks on your progress and for the needed blood tests. It is important to keep all appointments. What side effects may I notice from receiving this medicine? Side effects that you should report to your doctor or health care professional as soon as possible:  allergic reactions like skin rash, itching or hives, swelling of the face, lips, or tongue  signs and symptoms of bleeding such as bloody or black, tarry stools; red or dark brown urine; spitting up blood or brown material that looks like coffee grounds; red spots on the skin; unusual bruising or bleeding from the eyes, gums, or nose  signs and symptoms of a blood clot such as chest pain; shortness of breath; pain, swelling, or warmth in the leg  signs and symptoms of a stroke like changes in vision; confusion; trouble speaking or understanding; severe headaches; sudden numbness or weakness of the face, arm or leg; trouble walking; dizziness; loss of balance or coordination Side effects that usually do not require medical attention (report to your doctor or health care professional if they continue or are bothersome):  headache  pain in arms and legs  pain in mouth  stomach pain This list may not describe all possible side effects. Call your doctor for medical advice about side effects. You may report side effects to FDA at 1-800-FDA-1088. Where should I keep my medicine? This drug is given in a hospital or clinic   and will not be stored at home. NOTE: This sheet is a summary. It may not cover all possible information. If you have questions about this medicine, talk to your doctor, pharmacist, or health care provider.  2020 Elsevier/Gold Standard (2017-08-25 11:10:55)  

## 2020-07-13 MED FILL — LORazepam 2 MG TABS: 2 | 30 days supply | Qty: 90 | Fill #1

## 2020-07-18 ENCOUNTER — Other Ambulatory Visit: Payer: Self-pay | Admitting: *Deleted

## 2020-07-18 DIAGNOSIS — D61818 Other pancytopenia: Secondary | ICD-10-CM

## 2020-07-18 DIAGNOSIS — D696 Thrombocytopenia, unspecified: Secondary | ICD-10-CM

## 2020-07-19 ENCOUNTER — Other Ambulatory Visit: Payer: Self-pay

## 2020-07-19 ENCOUNTER — Inpatient Hospital Stay: Payer: Medicaid Other

## 2020-07-19 VITALS — BP 103/68 | HR 81 | Temp 97.8°F | Resp 18

## 2020-07-19 DIAGNOSIS — D693 Immune thrombocytopenic purpura: Secondary | ICD-10-CM | POA: Diagnosis not present

## 2020-07-19 DIAGNOSIS — D61818 Other pancytopenia: Secondary | ICD-10-CM

## 2020-07-19 DIAGNOSIS — D696 Thrombocytopenia, unspecified: Secondary | ICD-10-CM

## 2020-07-19 LAB — CMP (CANCER CENTER ONLY)
ALT: 22 U/L (ref 0–44)
AST: 43 U/L — ABNORMAL HIGH (ref 15–41)
Albumin: 3.9 g/dL (ref 3.5–5.0)
Alkaline Phosphatase: 148 U/L — ABNORMAL HIGH (ref 38–126)
Anion gap: 8 (ref 5–15)
BUN: 9 mg/dL (ref 6–20)
CO2: 27 mmol/L (ref 22–32)
Calcium: 9.8 mg/dL (ref 8.9–10.3)
Chloride: 103 mmol/L (ref 98–111)
Creatinine: 0.64 mg/dL (ref 0.44–1.00)
GFR, Estimated: >60 mL/min (ref >60)
Glucose, Bld: 101 mg/dL — ABNORMAL HIGH (ref 70–99)
Potassium: 4.1 mmol/L (ref 3.5–5.1)
Sodium: 138 mmol/L (ref 135–145)
Total Bilirubin: 1.5 mg/dL — ABNORMAL HIGH (ref 0.3–1.2)
Total Protein: 7.4 g/dL (ref 6.5–8.1)

## 2020-07-19 LAB — CBC WITH DIFFERENTIAL (CANCER CENTER ONLY)
Abs Immature Granulocytes: 0.01 10*3/uL (ref 0.00–0.07)
Basophils Absolute: 0 10*3/uL (ref 0.0–0.1)
Basophils Relative: 0 %
Eosinophils Absolute: 0.2 10*3/uL (ref 0.0–0.5)
Eosinophils Relative: 5 %
HCT: 44.5 % (ref 36.0–46.0)
Hemoglobin: 14.9 g/dL (ref 12.0–15.0)
Immature Granulocytes: 0 %
Lymphocytes Relative: 18 %
Lymphs Abs: 0.5 10*3/uL — ABNORMAL LOW (ref 0.7–4.0)
MCH: 30.5 pg (ref 26.0–34.0)
MCHC: 33.5 g/dL (ref 30.0–36.0)
MCV: 91.2 fL (ref 80.0–100.0)
Monocytes Absolute: 0.3 10*3/uL (ref 0.1–1.0)
Monocytes Relative: 12 %
Neutro Abs: 1.8 10*3/uL (ref 1.7–7.7)
Neutrophils Relative %: 65 %
Platelet Count: 99 10*3/uL — ABNORMAL LOW (ref 150–400)
RBC: 4.88 MIL/uL (ref 3.87–5.11)
RDW: 15 % (ref 11.5–15.5)
WBC Count: 2.8 10*3/uL — ABNORMAL LOW (ref 4.0–10.5)
nRBC: 0 % (ref 0.0–0.2)

## 2020-07-19 LAB — PLATELET BY CITRATE

## 2020-07-19 MED ORDER — ROMIPLOSTIM 250 MCG ~~LOC~~ SOLR
130.0000 ug | Freq: Once | SUBCUTANEOUS | Status: AC
  Administered 2020-07-19: 130 ug via SUBCUTANEOUS
  Filled 2020-07-19: qty 0.26

## 2020-07-19 NOTE — Patient Instructions (Signed)
Romiplostim injection What is this medicine? ROMIPLOSTIM (roe mi PLOE stim) helps your body make more platelets. This medicine is used to treat low platelets caused by chronic idiopathic thrombocytopenic purpura (ITP). This medicine may be used for other purposes; ask your health care provider or pharmacist if you have questions. COMMON BRAND NAME(S): Nplate What should I tell my health care provider before I take this medicine? They need to know if you have any of these conditions:  bleeding disorders  bone marrow problem, like blood cancer or myelodysplastic syndrome  history of blood clots  liver disease  surgery to remove your spleen  an unusual or allergic reaction to romiplostim, mannitol, other medicines, foods, dyes, or preservatives  pregnant or trying to get pregnant  breast-feeding How should I use this medicine? This medicine is for injection under the skin. It is given by a health care professional in a hospital or clinic setting. A special MedGuide will be given to you before your injection. Read this information carefully each time. Talk to your pediatrician regarding the use of this medicine in children. While this drug may be prescribed for children as young as 1 year for selected conditions, precautions do apply. Overdosage: If you think you have taken too much of this medicine contact a poison control center or emergency room at once. NOTE: This medicine is only for you. Do not share this medicine with others. What if I miss a dose? It is important not to miss your dose. Call your doctor or health care professional if you are unable to keep an appointment. What may interact with this medicine? Interactions are not expected. This list may not describe all possible interactions. Give your health care provider a list of all the medicines, herbs, non-prescription drugs, or dietary supplements you use. Also tell them if you smoke, drink alcohol, or use illegal drugs.  Some items may interact with your medicine. What should I watch for while using this medicine? Your condition will be monitored carefully while you are receiving this medicine. Visit your prescriber or health care professional for regular checks on your progress and for the needed blood tests. It is important to keep all appointments. What side effects may I notice from receiving this medicine? Side effects that you should report to your doctor or health care professional as soon as possible:  allergic reactions like skin rash, itching or hives, swelling of the face, lips, or tongue  signs and symptoms of bleeding such as bloody or black, tarry stools; red or dark brown urine; spitting up blood or brown material that looks like coffee grounds; red spots on the skin; unusual bruising or bleeding from the eyes, gums, or nose  signs and symptoms of a blood clot such as chest pain; shortness of breath; pain, swelling, or warmth in the leg  signs and symptoms of a stroke like changes in vision; confusion; trouble speaking or understanding; severe headaches; sudden numbness or weakness of the face, arm or leg; trouble walking; dizziness; loss of balance or coordination Side effects that usually do not require medical attention (report to your doctor or health care professional if they continue or are bothersome):  headache  pain in arms and legs  pain in mouth  stomach pain This list may not describe all possible side effects. Call your doctor for medical advice about side effects. You may report side effects to FDA at 1-800-FDA-1088. Where should I keep my medicine? This drug is given in a hospital or clinic   and will not be stored at home. NOTE: This sheet is a summary. It may not cover all possible information. If you have questions about this medicine, talk to your doctor, pharmacist, or health care provider.  2020 Elsevier/Gold Standard (2017-08-25 11:10:55)  

## 2020-07-25 ENCOUNTER — Other Ambulatory Visit: Payer: Self-pay | Admitting: *Deleted

## 2020-07-25 DIAGNOSIS — D696 Thrombocytopenia, unspecified: Secondary | ICD-10-CM

## 2020-07-25 DIAGNOSIS — D61818 Other pancytopenia: Secondary | ICD-10-CM

## 2020-07-26 ENCOUNTER — Inpatient Hospital Stay: Payer: Medicaid Other

## 2020-07-26 ENCOUNTER — Other Ambulatory Visit: Payer: Self-pay

## 2020-07-26 ENCOUNTER — Telehealth: Payer: Self-pay

## 2020-07-26 VITALS — BP 100/66 | HR 72 | Temp 98.4°F | Resp 17

## 2020-07-26 DIAGNOSIS — D61818 Other pancytopenia: Secondary | ICD-10-CM

## 2020-07-26 DIAGNOSIS — D693 Immune thrombocytopenic purpura: Secondary | ICD-10-CM | POA: Diagnosis not present

## 2020-07-26 DIAGNOSIS — D696 Thrombocytopenia, unspecified: Secondary | ICD-10-CM

## 2020-07-26 LAB — CMP (CANCER CENTER ONLY)
ALT: 16 U/L (ref 0–44)
AST: 28 U/L (ref 15–41)
Albumin: 3.4 g/dL — ABNORMAL LOW (ref 3.5–5.0)
Alkaline Phosphatase: 154 U/L — ABNORMAL HIGH (ref 38–126)
Anion gap: 5 (ref 5–15)
BUN: 7 mg/dL (ref 6–20)
CO2: 29 mmol/L (ref 22–32)
Calcium: 9.6 mg/dL (ref 8.9–10.3)
Chloride: 106 mmol/L (ref 98–111)
Creatinine: 0.66 mg/dL (ref 0.44–1.00)
GFR, Estimated: >60 mL/min (ref >60)
Glucose, Bld: 92 mg/dL (ref 70–99)
Potassium: 4.7 mmol/L (ref 3.5–5.1)
Sodium: 140 mmol/L (ref 135–145)
Total Bilirubin: 0.9 mg/dL (ref 0.3–1.2)
Total Protein: 6.7 g/dL (ref 6.5–8.1)

## 2020-07-26 LAB — CBC WITH DIFFERENTIAL (CANCER CENTER ONLY)
Abs Immature Granulocytes: 0.01 10*3/uL (ref 0.00–0.07)
Basophils Absolute: 0 10*3/uL (ref 0.0–0.1)
Basophils Relative: 0 %
Eosinophils Absolute: 0.2 10*3/uL (ref 0.0–0.5)
Eosinophils Relative: 5 %
HCT: 42 % (ref 36.0–46.0)
Hemoglobin: 13.9 g/dL (ref 12.0–15.0)
Immature Granulocytes: 0 %
Lymphocytes Relative: 17 %
Lymphs Abs: 0.5 10*3/uL — ABNORMAL LOW (ref 0.7–4.0)
MCH: 30.9 pg (ref 26.0–34.0)
MCHC: 33.1 g/dL (ref 30.0–36.0)
MCV: 93.3 fL (ref 80.0–100.0)
Monocytes Absolute: 0.3 10*3/uL (ref 0.1–1.0)
Monocytes Relative: 9 %
Neutro Abs: 2.1 10*3/uL (ref 1.7–7.7)
Neutrophils Relative %: 69 %
Platelet Count: 73 10*3/uL — ABNORMAL LOW (ref 150–400)
RBC: 4.5 MIL/uL (ref 3.87–5.11)
RDW: 15.2 % (ref 11.5–15.5)
WBC Count: 3.1 10*3/uL — ABNORMAL LOW (ref 4.0–10.5)
nRBC: 0 % (ref 0.0–0.2)

## 2020-07-26 MED ORDER — ROMIPLOSTIM 250 MCG ~~LOC~~ SOLR
130.0000 ug | Freq: Once | SUBCUTANEOUS | Status: AC
  Administered 2020-07-26: 130 ug via SUBCUTANEOUS
  Filled 2020-07-26: qty 0.26

## 2020-07-26 NOTE — Patient Instructions (Signed)
Romiplostim injection What is this medicine? ROMIPLOSTIM (roe mi PLOE stim) helps your body make more platelets. This medicine is used to treat low platelets caused by chronic idiopathic thrombocytopenic purpura (ITP). This medicine may be used for other purposes; ask your health care provider or pharmacist if you have questions. COMMON BRAND NAME(S): Nplate What should I tell my health care provider before I take this medicine? They need to know if you have any of these conditions:  bleeding disorders  bone marrow problem, like blood cancer or myelodysplastic syndrome  history of blood clots  liver disease  surgery to remove your spleen  an unusual or allergic reaction to romiplostim, mannitol, other medicines, foods, dyes, or preservatives  pregnant or trying to get pregnant  breast-feeding How should I use this medicine? This medicine is for injection under the skin. It is given by a health care professional in a hospital or clinic setting. A special MedGuide will be given to you before your injection. Read this information carefully each time. Talk to your pediatrician regarding the use of this medicine in children. While this drug may be prescribed for children as young as 1 year for selected conditions, precautions do apply. Overdosage: If you think you have taken too much of this medicine contact a poison control center or emergency room at once. NOTE: This medicine is only for you. Do not share this medicine with others. What if I miss a dose? It is important not to miss your dose. Call your doctor or health care professional if you are unable to keep an appointment. What may interact with this medicine? Interactions are not expected. This list may not describe all possible interactions. Give your health care provider a list of all the medicines, herbs, non-prescription drugs, or dietary supplements you use. Also tell them if you smoke, drink alcohol, or use illegal drugs.  Some items may interact with your medicine. What should I watch for while using this medicine? Your condition will be monitored carefully while you are receiving this medicine. Visit your prescriber or health care professional for regular checks on your progress and for the needed blood tests. It is important to keep all appointments. What side effects may I notice from receiving this medicine? Side effects that you should report to your doctor or health care professional as soon as possible:  allergic reactions like skin rash, itching or hives, swelling of the face, lips, or tongue  signs and symptoms of bleeding such as bloody or black, tarry stools; red or dark brown urine; spitting up blood or brown material that looks like coffee grounds; red spots on the skin; unusual bruising or bleeding from the eyes, gums, or nose  signs and symptoms of a blood clot such as chest pain; shortness of breath; pain, swelling, or warmth in the leg  signs and symptoms of a stroke like changes in vision; confusion; trouble speaking or understanding; severe headaches; sudden numbness or weakness of the face, arm or leg; trouble walking; dizziness; loss of balance or coordination Side effects that usually do not require medical attention (report to your doctor or health care professional if they continue or are bothersome):  headache  pain in arms and legs  pain in mouth  stomach pain This list may not describe all possible side effects. Call your doctor for medical advice about side effects. You may report side effects to FDA at 1-800-FDA-1088. Where should I keep my medicine? This drug is given in a hospital or clinic   and will not be stored at home. NOTE: This sheet is a summary. It may not cover all possible information. If you have questions about this medicine, talk to your doctor, pharmacist, or health care provider.  2020 Elsevier/Gold Standard (2017-08-25 11:10:55)  

## 2020-07-26 NOTE — Telephone Encounter (Signed)
Pt called and r/s to a later time as her transportation was delayed... AOM

## 2020-07-26 NOTE — Progress Notes (Signed)
1103: Pt discharged in no apparent distress. Pt left ambulatory without assistance.  Pt aware of discharge instructions and verbalized understanding and had no further questions.

## 2020-08-01 ENCOUNTER — Other Ambulatory Visit: Payer: Self-pay | Admitting: *Deleted

## 2020-08-01 DIAGNOSIS — K7031 Alcoholic cirrhosis of liver with ascites: Secondary | ICD-10-CM

## 2020-08-01 DIAGNOSIS — D696 Thrombocytopenia, unspecified: Secondary | ICD-10-CM

## 2020-08-01 DIAGNOSIS — D61818 Other pancytopenia: Secondary | ICD-10-CM

## 2020-08-02 ENCOUNTER — Inpatient Hospital Stay: Payer: Medicaid Other

## 2020-08-02 ENCOUNTER — Other Ambulatory Visit: Payer: Self-pay

## 2020-08-02 VITALS — BP 98/56 | HR 76 | Temp 97.7°F | Resp 18

## 2020-08-02 DIAGNOSIS — D696 Thrombocytopenia, unspecified: Secondary | ICD-10-CM

## 2020-08-02 DIAGNOSIS — K7031 Alcoholic cirrhosis of liver with ascites: Secondary | ICD-10-CM

## 2020-08-02 DIAGNOSIS — D693 Immune thrombocytopenic purpura: Secondary | ICD-10-CM | POA: Diagnosis not present

## 2020-08-02 DIAGNOSIS — D61818 Other pancytopenia: Secondary | ICD-10-CM

## 2020-08-02 LAB — CMP (CANCER CENTER ONLY)
ALT: 16 U/L (ref 0–44)
AST: 28 U/L (ref 15–41)
Albumin: 3.9 g/dL (ref 3.5–5.0)
Alkaline Phosphatase: 124 U/L (ref 38–126)
Anion gap: 6 (ref 5–15)
BUN: 8 mg/dL (ref 6–20)
CO2: 28 mmol/L (ref 22–32)
Calcium: 9.7 mg/dL (ref 8.9–10.3)
Chloride: 107 mmol/L (ref 98–111)
Creatinine: 0.6 mg/dL (ref 0.44–1.00)
GFR, Estimated: >60 mL/min (ref >60)
Glucose, Bld: 99 mg/dL (ref 70–99)
Potassium: 4.9 mmol/L (ref 3.5–5.1)
Sodium: 141 mmol/L (ref 135–145)
Total Bilirubin: 1.7 mg/dL — ABNORMAL HIGH (ref 0.3–1.2)
Total Protein: 7.3 g/dL (ref 6.5–8.1)

## 2020-08-02 LAB — CBC WITH DIFFERENTIAL (CANCER CENTER ONLY)
Abs Immature Granulocytes: 0 10*3/uL (ref 0.00–0.07)
Basophils Absolute: 0 10*3/uL (ref 0.0–0.1)
Basophils Relative: 0 %
Eosinophils Absolute: 0.1 10*3/uL (ref 0.0–0.5)
Eosinophils Relative: 5 %
HCT: 45 % (ref 36.0–46.0)
Hemoglobin: 14.8 g/dL (ref 12.0–15.0)
Immature Granulocytes: 0 %
Lymphocytes Relative: 20 %
Lymphs Abs: 0.5 10*3/uL — ABNORMAL LOW (ref 0.7–4.0)
MCH: 30.6 pg (ref 26.0–34.0)
MCHC: 32.9 g/dL (ref 30.0–36.0)
MCV: 93.2 fL (ref 80.0–100.0)
Monocytes Absolute: 0.3 10*3/uL (ref 0.1–1.0)
Monocytes Relative: 12 %
Neutro Abs: 1.5 10*3/uL — ABNORMAL LOW (ref 1.7–7.7)
Neutrophils Relative %: 63 %
Platelet Count: 85 10*3/uL — ABNORMAL LOW (ref 150–400)
RBC: 4.83 MIL/uL (ref 3.87–5.11)
RDW: 15.3 % (ref 11.5–15.5)
WBC Count: 2.4 10*3/uL — ABNORMAL LOW (ref 4.0–10.5)
nRBC: 0 % (ref 0.0–0.2)

## 2020-08-02 MED ORDER — ROMIPLOSTIM 250 MCG ~~LOC~~ SOLR
130.0000 ug | Freq: Once | SUBCUTANEOUS | Status: AC
  Administered 2020-08-02: 130 ug via SUBCUTANEOUS
  Filled 2020-08-02: qty 0.26

## 2020-08-02 NOTE — Progress Notes (Signed)
Pt discharged in no apparent distress. Pt left ambulatory without assistance. Pt aware of discharge instructions and verbalized understanding and had no further questions.  

## 2020-08-02 NOTE — Patient Instructions (Signed)
Romiplostim injection What is this medicine? ROMIPLOSTIM (roe mi PLOE stim) helps your body make more platelets. This medicine is used to treat low platelets caused by chronic idiopathic thrombocytopenic purpura (ITP). This medicine may be used for other purposes; ask your health care provider or pharmacist if you have questions. COMMON BRAND NAME(S): Nplate What should I tell my health care provider before I take this medicine? They need to know if you have any of these conditions:  bleeding disorders  bone marrow problem, like blood cancer or myelodysplastic syndrome  history of blood clots  liver disease  surgery to remove your spleen  an unusual or allergic reaction to romiplostim, mannitol, other medicines, foods, dyes, or preservatives  pregnant or trying to get pregnant  breast-feeding How should I use this medicine? This medicine is for injection under the skin. It is given by a health care professional in a hospital or clinic setting. A special MedGuide will be given to you before your injection. Read this information carefully each time. Talk to your pediatrician regarding the use of this medicine in children. While this drug may be prescribed for children as young as 1 year for selected conditions, precautions do apply. Overdosage: If you think you have taken too much of this medicine contact a poison control center or emergency room at once. NOTE: This medicine is only for you. Do not share this medicine with others. What if I miss a dose? It is important not to miss your dose. Call your doctor or health care professional if you are unable to keep an appointment. What may interact with this medicine? Interactions are not expected. This list may not describe all possible interactions. Give your health care provider a list of all the medicines, herbs, non-prescription drugs, or dietary supplements you use. Also tell them if you smoke, drink alcohol, or use illegal drugs.  Some items may interact with your medicine. What should I watch for while using this medicine? Your condition will be monitored carefully while you are receiving this medicine. Visit your prescriber or health care professional for regular checks on your progress and for the needed blood tests. It is important to keep all appointments. What side effects may I notice from receiving this medicine? Side effects that you should report to your doctor or health care professional as soon as possible:  allergic reactions like skin rash, itching or hives, swelling of the face, lips, or tongue  signs and symptoms of bleeding such as bloody or black, tarry stools; red or dark brown urine; spitting up blood or brown material that looks like coffee grounds; red spots on the skin; unusual bruising or bleeding from the eyes, gums, or nose  signs and symptoms of a blood clot such as chest pain; shortness of breath; pain, swelling, or warmth in the leg  signs and symptoms of a stroke like changes in vision; confusion; trouble speaking or understanding; severe headaches; sudden numbness or weakness of the face, arm or leg; trouble walking; dizziness; loss of balance or coordination Side effects that usually do not require medical attention (report to your doctor or health care professional if they continue or are bothersome):  headache  pain in arms and legs  pain in mouth  stomach pain This list may not describe all possible side effects. Call your doctor for medical advice about side effects. You may report side effects to FDA at 1-800-FDA-1088. Where should I keep my medicine? This drug is given in a hospital or clinic   and will not be stored at home. NOTE: This sheet is a summary. It may not cover all possible information. If you have questions about this medicine, talk to your doctor, pharmacist, or health care provider.  2020 Elsevier/Gold Standard (2017-08-25 11:10:55)  

## 2020-08-08 ENCOUNTER — Other Ambulatory Visit: Payer: Self-pay | Admitting: *Deleted

## 2020-08-08 ENCOUNTER — Other Ambulatory Visit: Payer: Self-pay

## 2020-08-08 ENCOUNTER — Telehealth (INDEPENDENT_AMBULATORY_CARE_PROVIDER_SITE_OTHER): Payer: Medicaid Other | Admitting: Psychiatry

## 2020-08-08 DIAGNOSIS — D61818 Other pancytopenia: Secondary | ICD-10-CM

## 2020-08-08 DIAGNOSIS — F413 Other mixed anxiety disorders: Secondary | ICD-10-CM | POA: Diagnosis not present

## 2020-08-08 DIAGNOSIS — F39 Unspecified mood [affective] disorder: Secondary | ICD-10-CM | POA: Diagnosis not present

## 2020-08-08 DIAGNOSIS — D696 Thrombocytopenia, unspecified: Secondary | ICD-10-CM

## 2020-08-08 DIAGNOSIS — F1021 Alcohol dependence, in remission: Secondary | ICD-10-CM

## 2020-08-08 DIAGNOSIS — F419 Anxiety disorder, unspecified: Secondary | ICD-10-CM

## 2020-08-08 MED ORDER — MIRTAZAPINE 30 MG PO TABS: 30.0000 mg | ORAL_TABLET | Freq: Every day | ORAL | 5 refills | Status: DC

## 2020-08-08 MED ORDER — FLUOXETINE HCL 40 MG PO CAPS: 40.0000 mg | ORAL_CAPSULE | Freq: Every day | ORAL | 5 refills | Status: DC

## 2020-08-08 MED ORDER — ARIPIPRAZOLE 10 MG PO TABS: 10.0000 mg | ORAL_TABLET | Freq: Every day | ORAL | 5 refills | Status: DC

## 2020-08-08 MED ORDER — LORAZEPAM 2 MG PO TABS
2.0000 mg | ORAL_TABLET | Freq: Two times a day (BID) | ORAL | 2 refills | Status: DC | PRN
Start: 2020-08-08 — End: 2020-08-28

## 2020-08-08 NOTE — Progress Notes (Addendum)
[place] MD/PA/NP OP Progress Note  08/08/2020 9:45 AM Briana Sweeney  MRN:  [id number] Interview was conducted by phone and I verified that I was speaking with the correct person using two identifiers. I discussed the limitations of evaluation and management by telemedicine and  the availability of in person appointments. Patient expressed understanding and agreed to proceed. Participants in the visit: patient (location - home); physician (location - home office).  Chief Complaint: Less anxious.  HPI: 49 yo divorced female with a long hx of alcohol use disorder, polysubstance abuse (cocaine, heroin), and past diagnoses of major depressive disorder vs substance-induced mood disorder vs bipolar disorder. Diagnosis of schizophrenia is also mentioned in the record but patient is unable to provide any clear history of psychotic episodes unrelated to her drug/alcohol use. She was first treated for mental problems at age 25 when she became depressed and OD on meds. She has been taking fluoxetine (up to 60 mg) for over 20 years per her report. She also tried buspirone, lorazepamand hydroxyzine for anxiety with no clear benefit. Recently prescribed trazodone for insomnia does not appear to work either. She has never been on any mood stabilizers except for lithium or antipsychotics apart from haloperidol. She did not like how she felt on either of those. She started abusing alcohol at age of 41. At one time she was drinking a 1/2 gallon of vodka plus some beer on a daily basis. She has a hx of alcohol withdrawal delirium. She has some periods of sobriety lasting few months at a time but cannot provide more details. She has alcohol liver cirrhosis, thrombocytopenia andhepatitis C. Last platelet count 205. She minimizes use of illicit drugs in the past claiming that she only usedthem when "partying".We have added mirtazapine and she reports that sleep has improved. She continues to complain primarily of severe  anxiety both generalized and panic type. She has no support, lives alone, struggles financially. Briana Sweeney reports that she has not been drinking alcohol last six months. She has not been taking buspironeor hydroxyzine as they were ineffective for anxiety. She was then started onclonazepam for panic attacks which she found helpful for anxiety. She lost granddaughter in September and clonazepam was changed to lorazepam - she finds it more effective and has been using it twice daily (originally prescribed tid prn). Anxiety has decreased lately.    Visit Diagnosis:    ICD-10-CM   1. Other mixed anxiety disorders  F41.3   2. Anxiety  F41.9 FLUoxetine (PROZAC) 40 MG capsule  3. Alcohol use disorder, severe, in early remission ([place])  F10.21   4. Episodic mood disorder ([place])  F39     Past Psychiatric History: Please see intake H&P.  Past Medical History:  Past Medical History:  Diagnosis Date  . Alcohol use   . Anxiety   . Bipolar affective disorder ([place])    With anxiety features  . [place] of right foot 11/2019  . Cirrhosis of liver ([place])    Due to alcohol and hepatitis C  . Depression   . 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 ETOHism ([place])   . GERD (gastroesophageal reflux disease)   . Hematemesis 02/10/2018  . Hepatitis C 2018   hepatitis c and alcohol related hepatitis  . Heroin abuse ([place])   . History of blood transfusion    "blood doesn't clot; I fell down and had to have a transfusion"  . History of kidney stones   . History of thrombocytopenia   .  Menopause 2016  . Migraine    "when I get really stressed" (09/01/2017)  . S/P foot surgery, right 11/2019  . Schizophrenia ([place])   . Seizures ([place])    "when I run out of my RX; lots recently" (09/01/2017)  . Thrombocytopenia ([place])   . Urinary urgency 05/2020    Past Surgical History:  Procedure Laterality Date  . ESOPHAGOGASTRODUODENOSCOPY N/A 09/03/2017   Procedure: ESOPHAGOGASTRODUODENOSCOPY (EGD);  Surgeon: Doran Stabler, MD;  Location: [facility];  Service: Gastroenterology;  Laterality: N/A;  . FINGER FRACTURE SURGERY Left    "shattered my pinky"  . FRACTURE SURGERY    . I & D EXTREMITY Left 09/18/2018   Procedure: IRRIGATION AND DEBRIDEMENT EXTREMITY;  Surgeon: Leanora Cover, MD;  Location: [facility];  Service: Orthopedics;  Laterality: Left;  . IR PARACENTESIS  07/23/2017  . IR PARACENTESIS  07/2017   "did it twice in the same week" (09/01/2017)  . SHOULDER OPEN ROTATOR CUFF REPAIR Right   . TOOTH EXTRACTION  08/2019  . TUBAL LIGATION    . VAGINAL HYSTERECTOMY      Family Psychiatric History: Reviewed.  Family History:  Family History  Problem Relation Age of Onset  . Lung cancer Mother 74  . Alcohol abuse Mother   . Throat cancer Father 60    Social History:  Social History   Socioeconomic History  . Marital status: Divorced    Spouse name: Not on file  . Number of children: 2  . Years of education: Not on file  . Highest education level: Not on file  Occupational History  . Occupation: applying for disability  Tobacco Use  . Smoking status: Never Smoker  . Smokeless tobacco: Never Used  Vaping Use  . Vaping Use: Never used  Substance and Sexual Activity  . Alcohol use: Yes    Alcohol/week: 6.0 standard drinks    Types: 6 Cans of beer per week    Comment:  6 pack per week.  . Drug use: Not Currently    Comment: cocaiane not currently  . Sexual activity: Yes  Other Topics Concern  . Not on file  Social History Narrative   She moved with a boyfriend to [place] and was followed at [facility].  He died of a massive heart attack in 8/18, per her report, and so she moved back to [place] and is living with a friend.      Right handed   2 story home    Lives alone 11/09/19   Social Determinants of Health   Financial Resource Strain:   . Difficulty of Paying Living Expenses: Not on file  Food Insecurity:   . Worried About Charity fundraiser in the Last  Year: Not on file  . Ran Out of Food in the Last Year: Not on file  Transportation Needs:   . Lack of Transportation (Medical): Not on file  . Lack of Transportation (Non-Medical): Not on file  Physical Activity:   . Days of Exercise per Week: Not on file  . Minutes of Exercise per Session: Not on file  Stress:   . Feeling of Stress : Not on file  Social Connections:   . Frequency of Communication with Friends and Family: Not on file  . Frequency of Social Gatherings with Friends and Family: Not on file  . Attends Religious Services: Not on file  . Active Member of Clubs or Organizations: Not on file  . Attends Archivist Meetings:  Not on file  . Marital Status: Not on file    Allergies:  Allergies  Allergen Reactions  . Other Other (See Comments)    Platelets: Rx chest pain, tremors, body aches    Metabolic Disorder Labs: Lab Results  Component Value Date   HGBA1C 4.9 05/26/2020   HGBA1C 4.9 05/26/2020   HGBA1C 4.9 (A) 05/26/2020   HGBA1C 4.9 05/26/2020   MPG 100 12/15/2019   No results found for: PROLACTIN Lab Results  Component Value Date   CHOL 213 (H) 12/13/2019   TRIG 85 12/13/2019   HDL 108 12/13/2019   CHOLHDL 2.0 12/13/2019   LDLCALC 90 12/13/2019   LDLCALC 89 05/05/2019   Lab Results  Component Value Date   TSH 0.304 (L) 12/16/2019   TSH 0.618 12/13/2019    Therapeutic Level Labs: No results found for: LITHIUM No results found for: VALPROATE No components found for:  CBMZ  Current Medications: Current Outpatient Medications  Medication Sig Dispense Refill  . ARIPiprazole (ABILIFY) 10 MG tablet Take 1 tablet (10 mg total) by mouth daily. 30 tablet 5  . diclofenac Sodium (VOLTAREN) 1 % GEL Apply 4 g topically 4 (four) times daily. 100 g 2  . FLUoxetine (PROZAC) 40 MG capsule Take 1 capsule (40 mg total) by mouth daily. 30 capsule 5  . furosemide (LASIX) 40 MG tablet Take 1 tablet (40 mg total) by mouth daily. 30 tablet 7  . gabapentin  (NEURONTIN) 300 MG capsule Take 2 capsules (600 mg), by mouth, 3 times a day. 180 capsule 3  . lactulose (CHRONULAC) 10 GM/15ML solution Take 30 mLs (20 g total) by mouth 3 (three) times daily. Goal to have 2-3 BMs daily 240 mL 3  . levETIRAcetam (KEPPRA) 500 MG tablet Take 1 tablet (500 mg total) by mouth 2 (two) times daily. 180 tablet 3  . LORazepam (ATIVAN) 2 MG tablet Take 1 tablet (2 mg total) by mouth 2 (two) times daily as needed for anxiety. 60 tablet 2  . mirtazapine (REMERON) 30 MG tablet Take 1 tablet (30 mg total) by mouth at bedtime. 30 tablet 5  . Multiple Vitamin (MULTIVITAMIN WITH MINERALS) TABS tablet Take 1 tablet by mouth daily.    . polyethylene glycol (MIRALAX / GLYCOLAX) 17 g packet Take 17 g by mouth 2 (two) times daily. (Patient taking differently: Take 17 g by mouth daily as needed for mild constipation. ) 14 each 0   No current facility-administered medications for this visit.      Psychiatric Specialty Exam: Review of Systems  Psychiatric/Behavioral: The patient is nervous/anxious.   All other systems reviewed and are negative.   There were no vitals taken for this visit.There is no height or weight on file to calculate BMI.  General Appearance: NA  Eye Contact:  NA  Speech:  Clear and Coherent and Normal Rate  Volume:  Normal  Mood:  Anxious  Affect:  NA  Thought Process:  Goal Directed and Linear  Orientation:  Full (Time, Place, and Person)  Thought Content: Logical   Suicidal Thoughts:  No  Homicidal Thoughts:  No  Memory:  Immediate;   Good Recent;   Good Remote;   Good  Judgement:  Fair  Insight:  Fair  Psychomotor Activity:  NA  Concentration:  Concentration: Good  Recall:  Good  Fund of Knowledge: Good  Language: Good  Akathisia:  Negative  Handed:  Right  AIMS (if indicated): not done  Assets:  Communication Skills Desire for Improvement  Housing Resilience  ADL's:  Intact  Cognition: WNL  Sleep:  Good   Screenings: AIMS      Admission (Discharged) from 08/25/2018 in [facility] Admission (Discharged) from 10/28/2017 in [facility]  AIMS Total Score 0 0    AUDIT     Admission (Discharged) from 08/25/2018 in [facility] Admission (Discharged) from 10/28/2017 in [facility]  Alcohol Use Disorder Identification Test Final Score (AUDIT) 40 32    PHQ2-9     Office Visit from 05/26/2020 in [facility] Office Visit from 05/05/2019 in [facility] Office Visit from 04/22/2019 in [facility] Office Visit from 02/19/2019 in [facility] Office Visit from 09/29/2018 in [facility]  PHQ-2 Total Score 0 0 0 0 0       Assessment and Plan: 49 yo divorced female with a long hx of alcohol use disorder, polysubstance abuse (cocaine, heroin), and past diagnoses of major depressive disorder vs substance-induced mood disorder vs bipolar disorder. Diagnosis of schizophrenia is also mentioned in the record but patient is unable to provide any clear history of psychotic episodes unrelated to her drug/alcohol use. She was first treated for mental problems at age 55 when she became depressed and OD on meds. She has been taking fluoxetine (up to 60 mg) for over 20 years per her report. She also tried buspirone, lorazepamand hydroxyzine for anxiety with no clear benefit. Recently prescribed trazodone for insomnia does not appear to work either. She has never been on any mood stabilizers except for lithium or antipsychotics apart from haloperidol. She did not like how she felt on either of those. She started abusing alcohol at age of 4. At one time she was drinking a 1/2 gallon of vodka plus some beer on a daily basis. She has a hx of alcohol withdrawal delirium. She has some periods of sobriety lasting few  months at a time but cannot provide more details. She has alcohol liver cirrhosis, thrombocytopenia andhepatitis C. Last platelet count 205. She minimizes use of illicit drugs in the past claiming that she only usedthem when "partying".We have added mirtazapine and she reports that sleep has improved. She continues to complain primarily of severe anxiety both generalized and panic type. She has no support, lives alone, struggles financially. Sujata reports that she has not been drinking alcohol last six months. She has not been taking buspironeor hydroxyzine as they were ineffective for anxiety. She was then started onclonazepam for panic attacks which she found helpful for anxiety. She lost granddaughter in September and clonazepam was changed to lorazepam - she finds it more effective and has been using it twice daily (originally prescribed tid prn). Anxiety has decreased lately.   KZ:SWFUX anxiety disorder;Mood disorder unspecified (Drug-induced vs bipolar disorder unspecified); Alcohol use disorder severe in early remission  Plan: We will continue fluoxetine 40 mg daily, Abilifyto 10 mg,Remeron 30 mg andlorazepam 2 mg prn anxiety (bid now).Next appointment in3 months. The plan was discussed with patient who had an opportunity to ask questions and these were all answered. I spend77minutes in phone consultationwith the patient.    Stephanie Acre, MD 08/08/2020, 9:45 AM

## 2020-08-09 ENCOUNTER — Inpatient Hospital Stay: Payer: Medicaid Other | Attending: Hematology & Oncology

## 2020-08-09 ENCOUNTER — Inpatient Hospital Stay: Payer: Medicaid Other

## 2020-08-09 DIAGNOSIS — D693 Immune thrombocytopenic purpura: Secondary | ICD-10-CM | POA: Insufficient documentation

## 2020-08-16 ENCOUNTER — Inpatient Hospital Stay: Payer: Medicaid Other

## 2020-08-16 ENCOUNTER — Other Ambulatory Visit: Payer: Self-pay

## 2020-08-16 VITALS — BP 95/60 | HR 69 | Temp 98.3°F | Resp 18

## 2020-08-16 DIAGNOSIS — D61818 Other pancytopenia: Secondary | ICD-10-CM

## 2020-08-16 DIAGNOSIS — D696 Thrombocytopenia, unspecified: Secondary | ICD-10-CM

## 2020-08-16 DIAGNOSIS — D693 Immune thrombocytopenic purpura: Secondary | ICD-10-CM | POA: Diagnosis not present

## 2020-08-16 LAB — CBC WITH DIFFERENTIAL (CANCER CENTER ONLY)
Abs Immature Granulocytes: 0.01 10*3/uL (ref 0.00–0.07)
Basophils Absolute: 0 10*3/uL (ref 0.0–0.1)
Basophils Relative: 0 %
Eosinophils Absolute: 0.2 10*3/uL (ref 0.0–0.5)
Eosinophils Relative: 6 %
HCT: 44.8 % (ref 36.0–46.0)
Hemoglobin: 14.7 g/dL (ref 12.0–15.0)
Immature Granulocytes: 0 %
Lymphocytes Relative: 23 %
Lymphs Abs: 0.7 10*3/uL (ref 0.7–4.0)
MCH: 30.5 pg (ref 26.0–34.0)
MCHC: 32.8 g/dL (ref 30.0–36.0)
MCV: 92.9 fL (ref 80.0–100.0)
Monocytes Absolute: 0.4 10*3/uL (ref 0.1–1.0)
Monocytes Relative: 15 %
Neutro Abs: 1.6 10*3/uL — ABNORMAL LOW (ref 1.7–7.7)
Neutrophils Relative %: 56 %
Platelet Count: 61 10*3/uL — ABNORMAL LOW (ref 150–400)
RBC: 4.82 MIL/uL (ref 3.87–5.11)
RDW: 15.1 % (ref 11.5–15.5)
WBC Count: 2.8 10*3/uL — ABNORMAL LOW (ref 4.0–10.5)
nRBC: 0 % (ref 0.0–0.2)

## 2020-08-16 LAB — CMP (CANCER CENTER ONLY)
ALT: 17 U/L (ref 0–44)
AST: 31 U/L (ref 15–41)
Albumin: 3.5 g/dL (ref 3.5–5.0)
Alkaline Phosphatase: 174 U/L — ABNORMAL HIGH (ref 38–126)
Anion gap: 6 (ref 5–15)
BUN: 8 mg/dL (ref 6–20)
CO2: 29 mmol/L (ref 22–32)
Calcium: 9.6 mg/dL (ref 8.9–10.3)
Chloride: 105 mmol/L (ref 98–111)
Creatinine: 0.67 mg/dL (ref 0.44–1.00)
GFR, Estimated: >60 mL/min (ref >60)
Glucose, Bld: 88 mg/dL (ref 70–99)
Potassium: 4.3 mmol/L (ref 3.5–5.1)
Sodium: 140 mmol/L (ref 135–145)
Total Bilirubin: 1.2 mg/dL (ref 0.3–1.2)
Total Protein: 6.8 g/dL (ref 6.5–8.1)

## 2020-08-16 MED ORDER — ROMIPLOSTIM 250 MCG ~~LOC~~ SOLR
130.0000 ug | Freq: Once | SUBCUTANEOUS | Status: AC
  Administered 2020-08-16: 130 ug via SUBCUTANEOUS
  Filled 2020-08-16: qty 0.26

## 2020-08-16 NOTE — Patient Instructions (Signed)
Romiplostim injection What is this medicine? ROMIPLOSTIM (roe mi PLOE stim) helps your body make more platelets. This medicine is used to treat low platelets caused by chronic idiopathic thrombocytopenic purpura (ITP). This medicine may be used for other purposes; ask your health care provider or pharmacist if you have questions. COMMON BRAND NAME(S): Nplate What should I tell my health care provider before I take this medicine? They need to know if you have any of these conditions:  bleeding disorders  bone marrow problem, like blood cancer or myelodysplastic syndrome  history of blood clots  liver disease  surgery to remove your spleen  an unusual or allergic reaction to romiplostim, mannitol, other medicines, foods, dyes, or preservatives  pregnant or trying to get pregnant  breast-feeding How should I use this medicine? This medicine is for injection under the skin. It is given by a health care professional in a hospital or clinic setting. A special MedGuide will be given to you before your injection. Read this information carefully each time. Talk to your pediatrician regarding the use of this medicine in children. While this drug may be prescribed for children as young as 1 year for selected conditions, precautions do apply. Overdosage: If you think you have taken too much of this medicine contact a poison control center or emergency room at once. NOTE: This medicine is only for you. Do not share this medicine with others. What if I miss a dose? It is important not to miss your dose. Call your doctor or health care professional if you are unable to keep an appointment. What may interact with this medicine? Interactions are not expected. This list may not describe all possible interactions. Give your health care provider a list of all the medicines, herbs, non-prescription drugs, or dietary supplements you use. Also tell them if you smoke, drink alcohol, or use illegal drugs.  Some items may interact with your medicine. What should I watch for while using this medicine? Your condition will be monitored carefully while you are receiving this medicine. Visit your prescriber or health care professional for regular checks on your progress and for the needed blood tests. It is important to keep all appointments. What side effects may I notice from receiving this medicine? Side effects that you should report to your doctor or health care professional as soon as possible:  allergic reactions like skin rash, itching or hives, swelling of the face, lips, or tongue  signs and symptoms of bleeding such as bloody or black, tarry stools; red or dark brown urine; spitting up blood or brown material that looks like coffee grounds; red spots on the skin; unusual bruising or bleeding from the eyes, gums, or nose  signs and symptoms of a blood clot such as chest pain; shortness of breath; pain, swelling, or warmth in the leg  signs and symptoms of a stroke like changes in vision; confusion; trouble speaking or understanding; severe headaches; sudden numbness or weakness of the face, arm or leg; trouble walking; dizziness; loss of balance or coordination Side effects that usually do not require medical attention (report to your doctor or health care professional if they continue or are bothersome):  headache  pain in arms and legs  pain in mouth  stomach pain This list may not describe all possible side effects. Call your doctor for medical advice about side effects. You may report side effects to FDA at 1-800-FDA-1088. Where should I keep my medicine? This drug is given in a hospital or clinic   and will not be stored at home. NOTE: This sheet is a summary. It may not cover all possible information. If you have questions about this medicine, talk to your doctor, pharmacist, or health care provider.  2020 Elsevier/Gold Standard (2017-08-25 11:10:55)  

## 2020-08-16 NOTE — Progress Notes (Signed)
Pt discharged in no apparent distress. Pt left ambulatory without assistance. Pt aware of discharge instructions and verbalized understanding and had no further questions.  

## 2020-08-18 ENCOUNTER — Telehealth (HOSPITAL_COMMUNITY): Payer: Self-pay | Admitting: *Deleted

## 2020-08-18 NOTE — Telephone Encounter (Signed)
FYI pt has called nurse line repeatedly asking for Klonopin to be sent to her pharmacy, [facility]. Writer spoke with pharmacist who states that pt picked up Ativan on 08/12/20. As pt has no phone to return calls, writer asked pharmacy if they would inform pt there is no Rx for Klonopin as it has been changed to Ativan 2 mg.

## 2020-08-21 ENCOUNTER — Other Ambulatory Visit (HOSPITAL_COMMUNITY): Payer: Self-pay | Admitting: Psychiatry

## 2020-08-21 MED FILL — MIRTAZAPINE 30 MG TABLET: 30 | 30 days supply | Qty: 30 | Fill #0

## 2020-08-22 ENCOUNTER — Telehealth (HOSPITAL_COMMUNITY): Payer: Self-pay | Admitting: *Deleted

## 2020-08-22 ENCOUNTER — Other Ambulatory Visit: Payer: Self-pay

## 2020-08-22 DIAGNOSIS — D696 Thrombocytopenia, unspecified: Secondary | ICD-10-CM

## 2020-08-22 MED FILL — levETIRAcetam 500 MG TABS: 500 | 90 days supply | Qty: 180 | Fill #0

## 2020-08-22 NOTE — Telephone Encounter (Signed)
[facility] called and stated that patient had called them and said she was switching to their pharmacy and that she wanted a refill of Klonopin.  Record checked and patient had Klonopin discontinued.  Informed pharmacist.

## 2020-08-22 NOTE — Telephone Encounter (Signed)
Pt continues to call asking for Klonopin stating I am going through detox despite the fact that she has picked up the Ativan 2 mg weeks ago per pt pharmacy. FYI.

## 2020-08-23 ENCOUNTER — Inpatient Hospital Stay: Payer: Medicaid Other

## 2020-08-24 ENCOUNTER — Other Ambulatory Visit (HOSPITAL_COMMUNITY): Payer: Self-pay | Admitting: Psychiatry

## 2020-08-24 MED FILL — FLUoxetine HCL 40 MG CAPS: 40 | 30 days supply | Qty: 30 | Fill #0

## 2020-08-28 ENCOUNTER — Telehealth (HOSPITAL_COMMUNITY): Payer: Self-pay | Admitting: *Deleted

## 2020-08-28 ENCOUNTER — Other Ambulatory Visit (HOSPITAL_COMMUNITY): Payer: Self-pay | Admitting: *Deleted

## 2020-08-28 MED ORDER — CLONAZEPAM 1 MG PO TABS: 1.0000 mg | ORAL_TABLET | Freq: Two times a day (BID) | ORAL | 2 refills | Status: DC

## 2020-08-28 NOTE — Telephone Encounter (Signed)
Two refills please so she can get enough till her appointment in March.

## 2020-08-28 NOTE — Telephone Encounter (Signed)
FYI pt continues to call requesting Klonopin despite the fact that she has been advised that she filled the Lorazepam a couple weeks ago per her pharmacy, and that has replaced the [place]. Pt states that she is having "withdrawls".

## 2020-08-28 NOTE — Telephone Encounter (Signed)
Yes she did. However wants to go back to the Clonazepam 1 mg. I will call in clonazepam 1 mg 1 bid. #60. Any refills? Next appointment not until 11/08/20.

## 2020-08-28 NOTE — Telephone Encounter (Signed)
If she prefers to go back on clonazepam I can cancel remaining 2 refills on lorazepam and reorder clonazepam. She was on 1 bid but herself requested to chang it to lorazepam?

## 2020-08-30 ENCOUNTER — Inpatient Hospital Stay: Payer: Medicaid Other | Admitting: Hematology & Oncology

## 2020-08-30 ENCOUNTER — Ambulatory Visit: Payer: Medicaid Other | Admitting: Family

## 2020-08-30 ENCOUNTER — Other Ambulatory Visit: Payer: Medicaid Other

## 2020-08-30 ENCOUNTER — Telehealth: Payer: Self-pay | Admitting: Family

## 2020-08-30 ENCOUNTER — Ambulatory Visit: Payer: Medicaid Other

## 2020-08-30 ENCOUNTER — Inpatient Hospital Stay: Payer: Medicaid Other

## 2020-08-30 NOTE — Telephone Encounter (Signed)
Called and spoke with patient regarding moving her appts from 12/22 to 12/27 due to her oversleeping this morning.  I also contacted transportation and have set up her ride for 12/27 . Appt time 8:15 was given to them as well.  They will contact patient on 12/23 to confirm her appt for 12/27 as well.

## 2020-09-04 ENCOUNTER — Ambulatory Visit: Payer: Medicaid Other | Admitting: Family

## 2020-09-04 ENCOUNTER — Ambulatory Visit: Payer: Medicaid Other

## 2020-09-04 ENCOUNTER — Other Ambulatory Visit: Payer: Medicaid Other

## 2020-09-06 ENCOUNTER — Inpatient Hospital Stay (HOSPITAL_BASED_OUTPATIENT_CLINIC_OR_DEPARTMENT_OTHER): Payer: Medicaid Other | Admitting: Family

## 2020-09-06 ENCOUNTER — Encounter: Payer: Self-pay | Admitting: Family

## 2020-09-06 ENCOUNTER — Inpatient Hospital Stay: Payer: Medicaid Other

## 2020-09-06 ENCOUNTER — Other Ambulatory Visit: Payer: Self-pay

## 2020-09-06 VITALS — BP 120/77 | HR 79 | Temp 98.2°F | Resp 18 | Ht 60.0 in | Wt 142.0 lb

## 2020-09-06 DIAGNOSIS — D696 Thrombocytopenia, unspecified: Secondary | ICD-10-CM

## 2020-09-06 DIAGNOSIS — K7031 Alcoholic cirrhosis of liver with ascites: Secondary | ICD-10-CM | POA: Diagnosis not present

## 2020-09-06 DIAGNOSIS — D693 Immune thrombocytopenic purpura: Secondary | ICD-10-CM | POA: Diagnosis not present

## 2020-09-06 LAB — CBC WITH DIFFERENTIAL (CANCER CENTER ONLY)
Abs Immature Granulocytes: 0.01 10*3/uL (ref 0.00–0.07)
Basophils Absolute: 0 10*3/uL (ref 0.0–0.1)
Basophils Relative: 1 %
Eosinophils Absolute: 0.2 10*3/uL (ref 0.0–0.5)
Eosinophils Relative: 4 %
HCT: 43.2 % (ref 36.0–46.0)
Hemoglobin: 14.3 g/dL (ref 12.0–15.0)
Immature Granulocytes: 0 %
Lymphocytes Relative: 18 %
Lymphs Abs: 0.8 10*3/uL (ref 0.7–4.0)
MCH: 30.8 pg (ref 26.0–34.0)
MCHC: 33.1 g/dL (ref 30.0–36.0)
MCV: 93.1 fL (ref 80.0–100.0)
Monocytes Absolute: 0.5 10*3/uL (ref 0.1–1.0)
Monocytes Relative: 11 %
Neutro Abs: 2.8 10*3/uL (ref 1.7–7.7)
Neutrophils Relative %: 66 %
Platelet Count: 31 10*3/uL — ABNORMAL LOW (ref 150–400)
RBC: 4.64 MIL/uL (ref 3.87–5.11)
RDW: 15.6 % — ABNORMAL HIGH (ref 11.5–15.5)
WBC Count: 4.2 10*3/uL (ref 4.0–10.5)
nRBC: 0 % (ref 0.0–0.2)

## 2020-09-06 LAB — CMP (CANCER CENTER ONLY)
ALT: 15 U/L (ref 0–44)
AST: 27 U/L (ref 15–41)
Albumin: 3.5 g/dL (ref 3.5–5.0)
Alkaline Phosphatase: 168 U/L — ABNORMAL HIGH (ref 38–126)
Anion gap: 7 (ref 5–15)
BUN: 6 mg/dL (ref 6–20)
CO2: 28 mmol/L (ref 22–32)
Calcium: 9.5 mg/dL (ref 8.9–10.3)
Chloride: 106 mmol/L (ref 98–111)
Creatinine: 0.58 mg/dL (ref 0.44–1.00)
GFR, Estimated: >60 mL/min (ref >60)
Glucose, Bld: 105 mg/dL — ABNORMAL HIGH (ref 70–99)
Potassium: 3.8 mmol/L (ref 3.5–5.1)
Sodium: 141 mmol/L (ref 135–145)
Total Bilirubin: 1.6 mg/dL — ABNORMAL HIGH (ref 0.3–1.2)
Total Protein: 6.7 g/dL (ref 6.5–8.1)

## 2020-09-06 MED ORDER — ROMIPLOSTIM 250 MCG ~~LOC~~ SOLR
3.0000 ug/kg | Freq: Once | SUBCUTANEOUS | Status: AC
  Administered 2020-09-06: 195 ug via SUBCUTANEOUS
  Filled 2020-09-06: qty 0.39

## 2020-09-06 NOTE — Patient Instructions (Signed)
Romiplostim injection What is this medicine? ROMIPLOSTIM (roe mi PLOE stim) helps your body make more platelets. This medicine is used to treat low platelets caused by chronic idiopathic thrombocytopenic purpura (ITP). This medicine may be used for other purposes; ask your health care provider or pharmacist if you have questions. COMMON BRAND NAME(S): Nplate What should I tell my health care provider before I take this medicine? They need to know if you have any of these conditions:  bleeding disorders  bone marrow problem, like blood cancer or myelodysplastic syndrome  history of blood clots  liver disease  surgery to remove your spleen  an unusual or allergic reaction to romiplostim, mannitol, other medicines, foods, dyes, or preservatives  pregnant or trying to get pregnant  breast-feeding How should I use this medicine? This medicine is for injection under the skin. It is given by a health care professional in a hospital or clinic setting. A special MedGuide will be given to you before your injection. Read this information carefully each time. Talk to your pediatrician regarding the use of this medicine in children. While this drug may be prescribed for children as young as 1 year for selected conditions, precautions do apply. Overdosage: If you think you have taken too much of this medicine contact a poison control center or emergency room at once. NOTE: This medicine is only for you. Do not share this medicine with others. What if I miss a dose? It is important not to miss your dose. Call your doctor or health care professional if you are unable to keep an appointment. What may interact with this medicine? Interactions are not expected. This list may not describe all possible interactions. Give your health care provider a list of all the medicines, herbs, non-prescription drugs, or dietary supplements you use. Also tell them if you smoke, drink alcohol, or use illegal drugs.  Some items may interact with your medicine. What should I watch for while using this medicine? Your condition will be monitored carefully while you are receiving this medicine. Visit your prescriber or health care professional for regular checks on your progress and for the needed blood tests. It is important to keep all appointments. What side effects may I notice from receiving this medicine? Side effects that you should report to your doctor or health care professional as soon as possible:  allergic reactions like skin rash, itching or hives, swelling of the face, lips, or tongue  signs and symptoms of bleeding such as bloody or black, tarry stools; red or dark brown urine; spitting up blood or brown material that looks like coffee grounds; red spots on the skin; unusual bruising or bleeding from the eyes, gums, or nose  signs and symptoms of a blood clot such as chest pain; shortness of breath; pain, swelling, or warmth in the leg  signs and symptoms of a stroke like changes in vision; confusion; trouble speaking or understanding; severe headaches; sudden numbness or weakness of the face, arm or leg; trouble walking; dizziness; loss of balance or coordination Side effects that usually do not require medical attention (report to your doctor or health care professional if they continue or are bothersome):  headache  pain in arms and legs  pain in mouth  stomach pain This list may not describe all possible side effects. Call your doctor for medical advice about side effects. You may report side effects to FDA at 1-800-FDA-1088. Where should I keep my medicine? This drug is given in a hospital or clinic   and will not be stored at home. NOTE: This sheet is a summary. It may not cover all possible information. If you have questions about this medicine, talk to your doctor, pharmacist, or health care provider.  2020 Elsevier/Gold Standard (2017-08-25 11:10:55)  

## 2020-09-06 NOTE — Progress Notes (Signed)
Hematology and Oncology Follow Up Visit  Briana Sweeney [id number] December 04, 1970 49 y.o. 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 She is feeling fatigued at this time.  Platelets are 31, Hgb 9.9, WBC count is 9.8.  She denies any bleeding. No abnormal bruising, no petechiae.  No fever, chills, n.v, cough, rash, dizziness, SOB, chest pain, palpitations, abdominal pain or changes in bowel or bladder habits.  No swelling, tenderness, numbness or tingling in her extremities at this time.  She denies falls or syncope.  She has maintained a good appetite and is staying hydrated. Her weight is stable at 142 lbs.   ECOG Performance Status: 1 - Symptomatic but completely ambulatory  Medications:  Allergies as of 09/06/2020      Reactions   Other Other (See Comments)   Platelets: Rx chest pain, tremors, body aches      Medication List       Accurate as of September 06, 2020 11:56 AM. If you have any questions, ask your nurse or doctor.        ARIPiprazole 10 MG tablet Commonly known as: ABILIFY Take 1 tablet (10 mg total) by mouth daily.   clonazePAM 1 MG tablet Commonly known as: KlonoPIN Take 1 tablet (1 mg total) by mouth 2 (two) times daily.   diclofenac Sodium 1 % Gel Commonly known as: VOLTAREN Apply 4 g topically 4 (four) times daily.   FLUoxetine 40 MG capsule Commonly known as: PROZAC Take 1 capsule (40 mg total) by mouth daily.   furosemide 40 MG tablet Commonly known as: Lasix Take 1 tablet (40 mg total) by mouth daily.   gabapentin 300 MG capsule Commonly known as: NEURONTIN Take 2 capsules (600 mg), by mouth, 3 times a day.   lactulose 10 GM/15ML solution Commonly known as: CHRONULAC Take 30 mLs (20 g total) by mouth 3 (three) times daily. Goal to have 2-3 BMs daily    levETIRAcetam 500 MG tablet Commonly known as: Keppra Take 1 tablet (500 mg total) by mouth 2 (two) times daily.   mirtazapine 30 MG tablet Commonly known as: REMERON Take 1 tablet (30 mg total) by mouth at bedtime.   multivitamin with minerals Tabs tablet Take 1 tablet by mouth daily.   polyethylene glycol 17 g packet Commonly known as: MIRALAX / GLYCOLAX Take 17 g by mouth 2 (two) times daily. What changed:   when to take this  reasons to take this       Allergies:  Allergies  Allergen Reactions  . Other Other (See Comments)    Platelets: Rx chest pain, tremors, body aches    Past Medical History, Surgical history, Social history, and Family History were reviewed and updated.  Review of Systems: All other 10 point review of systems is negative.   Physical Exam:  height is 5' (1.524 m) and weight is 142 lb (64.4 kg). Her oral temperature is 98.2 F (36.8 C). Her blood pressure is 120/77 and her pulse is 79. Her respiration is 18 and oxygen saturation is 100%.   Wt Readings from Last 3 Encounters:  09/06/20 142 lb (64.4 kg)  06/28/20 144 lb (65.3 kg)  06/20/20 146 lb 9.6 oz (66.5 kg)    Ocular: Sclerae unicteric, pupils equal, round and reactive to light Ear-nose-throat: Oropharynx clear, dentition fair Lymphatic: No cervical or supraclavicular  adenopathy Lungs no rales or rhonchi, good excursion bilaterally Heart regular rate and rhythm, no murmur appreciated Abd soft, nontender, positive bowel sounds MSK no focal spinal tenderness, no joint edema Neuro: non-focal, well-oriented, appropriate affect Breasts: Deferred   Lab Results  Component Value Date   WBC 4.2 09/06/2020   HGB 14.3 09/06/2020   HCT 43.2 09/06/2020   MCV 93.1 09/06/2020   PLT 31 (L) 09/06/2020   No results found for: FERRITIN, IRON, TIBC, UIBC, IRONPCTSAT Lab Results  Component Value Date   RBC 4.64 09/06/2020   No results found for: KPAFRELGTCHN, LAMBDASER, KAPLAMBRATIO No  results found for: IGGSERUM, IGA, IGMSERUM No results found for: Marda Stalker, SPEI   Chemistry      Component Value Date/Time   NA 141 09/06/2020 1055   NA 142 01/04/2020 1510   K 3.8 09/06/2020 1055   CL 106 09/06/2020 1055   CO2 28 09/06/2020 1055   BUN 6 09/06/2020 1055   BUN 6 01/04/2020 1510   CREATININE 0.58 09/06/2020 1055   CREATININE 0.54 05/01/2020 1043      Component Value Date/Time   CALCIUM 9.5 09/06/2020 1055   ALKPHOS 168 (H) 09/06/2020 1055   AST 27 09/06/2020 1055   ALT 15 09/06/2020 1055   BILITOT 1.6 (H) 09/06/2020 1055       Impression and Plan: Briana Sweeney is a very pleasant 49 yo caucasian female with 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 She received Nplate today. We will get her back on her weekly injection scheduled.  Follow-up in 8 weeks.  She was encouraged to contact our office with any questions or concerns.   Emeline Gins, NP 12/29/202111:56 AM

## 2020-09-13 ENCOUNTER — Other Ambulatory Visit (HOSPITAL_COMMUNITY): Payer: Self-pay | Admitting: Psychiatry

## 2020-09-13 ENCOUNTER — Other Ambulatory Visit: Payer: Medicaid Other

## 2020-09-13 ENCOUNTER — Ambulatory Visit: Payer: Medicaid Other

## 2020-09-13 ENCOUNTER — Telehealth: Payer: Self-pay

## 2020-09-13 MED FILL — ARIPIPRAZOLE 10 MG TABS: 10 | 30 days supply | Qty: 30 | Fill #0

## 2020-09-13 NOTE — Telephone Encounter (Signed)
Pt called in as she states that she coulld not get a ride/transport said they could not pick her up today, appt r/s to Friday 09/15/20    aom

## 2020-09-15 ENCOUNTER — Inpatient Hospital Stay: Payer: Medicaid Other | Attending: Hematology & Oncology

## 2020-09-15 ENCOUNTER — Other Ambulatory Visit: Payer: Self-pay

## 2020-09-15 ENCOUNTER — Inpatient Hospital Stay: Payer: Medicaid Other

## 2020-09-15 VITALS — BP 125/62 | HR 74 | Temp 97.6°F | Resp 18

## 2020-09-15 DIAGNOSIS — D693 Immune thrombocytopenic purpura: Secondary | ICD-10-CM | POA: Insufficient documentation

## 2020-09-15 DIAGNOSIS — D696 Thrombocytopenia, unspecified: Secondary | ICD-10-CM

## 2020-09-15 DIAGNOSIS — K7031 Alcoholic cirrhosis of liver with ascites: Secondary | ICD-10-CM

## 2020-09-15 LAB — CMP (CANCER CENTER ONLY)
ALT: 18 U/L (ref 0–44)
AST: 36 U/L (ref 15–41)
Albumin: 3.5 g/dL (ref 3.5–5.0)
Alkaline Phosphatase: 168 U/L — ABNORMAL HIGH (ref 38–126)
Anion gap: 6 (ref 5–15)
BUN: 8 mg/dL (ref 6–20)
CO2: 30 mmol/L (ref 22–32)
Calcium: 9.1 mg/dL (ref 8.9–10.3)
Chloride: 105 mmol/L (ref 98–111)
Creatinine: 0.59 mg/dL (ref 0.44–1.00)
GFR, Estimated: >60 mL/min (ref >60)
Glucose, Bld: 119 mg/dL — ABNORMAL HIGH (ref 70–99)
Potassium: 3.9 mmol/L (ref 3.5–5.1)
Sodium: 141 mmol/L (ref 135–145)
Total Bilirubin: 1.2 mg/dL (ref 0.3–1.2)
Total Protein: 6.7 g/dL (ref 6.5–8.1)

## 2020-09-15 LAB — CBC WITH DIFFERENTIAL (CANCER CENTER ONLY)
Abs Immature Granulocytes: 0.01 10*3/uL (ref 0.00–0.07)
Basophils Absolute: 0 10*3/uL (ref 0.0–0.1)
Basophils Relative: 1 %
Eosinophils Absolute: 0.2 10*3/uL (ref 0.0–0.5)
Eosinophils Relative: 5 %
HCT: 42.7 % (ref 36.0–46.0)
Hemoglobin: 14 g/dL (ref 12.0–15.0)
Immature Granulocytes: 0 %
Lymphocytes Relative: 17 %
Lymphs Abs: 0.6 10*3/uL — ABNORMAL LOW (ref 0.7–4.0)
MCH: 30.4 pg (ref 26.0–34.0)
MCHC: 32.8 g/dL (ref 30.0–36.0)
MCV: 92.8 fL (ref 80.0–100.0)
Monocytes Absolute: 0.4 10*3/uL (ref 0.1–1.0)
Monocytes Relative: 11 %
Neutro Abs: 2.2 10*3/uL (ref 1.7–7.7)
Neutrophils Relative %: 66 %
Platelet Count: 67 10*3/uL — ABNORMAL LOW (ref 150–400)
RBC: 4.6 MIL/uL (ref 3.87–5.11)
RDW: 15 % (ref 11.5–15.5)
WBC Count: 3.4 10*3/uL — ABNORMAL LOW (ref 4.0–10.5)
nRBC: 0 % (ref 0.0–0.2)

## 2020-09-15 MED ORDER — ROMIPLOSTIM 250 MCG ~~LOC~~ SOLR
195.0000 ug | Freq: Once | SUBCUTANEOUS | Status: AC
  Administered 2020-09-15: 195 ug via SUBCUTANEOUS
  Filled 2020-09-15: qty 0.39

## 2020-09-15 NOTE — Patient Instructions (Signed)
Romiplostim injection (Nplate) What is this medicine? ROMIPLOSTIM (roe mi PLOE stim) helps your body make more platelets. This medicine is used to treat low platelets caused by chronic idiopathic thrombocytopenic purpura (ITP). This medicine may be used for other purposes; ask your health care provider or pharmacist if you have questions. COMMON BRAND NAME(S): Nplate What should I tell my health care provider before I take this medicine? They need to know if you have any of these conditions:  bleeding disorders  bone marrow problem, like blood cancer or myelodysplastic syndrome  history of blood clots  liver disease  surgery to remove your spleen  an unusual or allergic reaction to romiplostim, mannitol, other medicines, foods, dyes, or preservatives  pregnant or trying to get pregnant  breast-feeding How should I use this medicine? This medicine is for injection under the skin. It is given by a health care professional in a hospital or clinic setting. A special MedGuide will be given to you before your injection. Read this information carefully each time. Talk to your pediatrician regarding the use of this medicine in children. While this drug may be prescribed for children as young as 1 year for selected conditions, precautions do apply. Overdosage: If you think you have taken too much of this medicine contact a poison control center or emergency room at once. NOTE: This medicine is only for you. Do not share this medicine with others. What if I miss a dose? It is important not to miss your dose. Call your doctor or health care professional if you are unable to keep an appointment. What may interact with this medicine? Interactions are not expected. This list may not describe all possible interactions. Give your health care provider a list of all the medicines, herbs, non-prescription drugs, or dietary supplements you use. Also tell them if you smoke, drink alcohol, or use illegal  drugs. Some items may interact with your medicine. What should I watch for while using this medicine? Your condition will be monitored carefully while you are receiving this medicine. Visit your prescriber or health care professional for regular checks on your progress and for the needed blood tests. It is important to keep all appointments. What side effects may I notice from receiving this medicine? Side effects that you should report to your doctor or health care professional as soon as possible:  allergic reactions like skin rash, itching or hives, swelling of the face, lips, or tongue  signs and symptoms of bleeding such as bloody or black, tarry stools; red or dark brown urine; spitting up blood or brown material that looks like coffee grounds; red spots on the skin; unusual bruising or bleeding from the eyes, gums, or nose  signs and symptoms of a blood clot such as chest pain; shortness of breath; pain, swelling, or warmth in the leg  signs and symptoms of a stroke like changes in vision; confusion; trouble speaking or understanding; severe headaches; sudden numbness or weakness of the face, arm or leg; trouble walking; dizziness; loss of balance or coordination Side effects that usually do not require medical attention (report to your doctor or health care professional if they continue or are bothersome):  headache  pain in arms and legs  pain in mouth  stomach pain This list may not describe all possible side effects. Call your doctor for medical advice about side effects. You may report side effects to FDA at 1-800-FDA-1088. Where should I keep my medicine? This drug is given in a hospital or   clinic and will not be stored at home. NOTE: This sheet is a summary. It may not cover all possible information. If you have questions about this medicine, talk to your doctor, pharmacist, or health care provider.  2020 Elsevier/Gold Standard (2017-08-25 11:10:55)  

## 2020-09-20 ENCOUNTER — Inpatient Hospital Stay: Payer: Medicaid Other

## 2020-09-20 ENCOUNTER — Other Ambulatory Visit: Payer: Self-pay

## 2020-09-20 DIAGNOSIS — K7031 Alcoholic cirrhosis of liver with ascites: Secondary | ICD-10-CM

## 2020-09-20 DIAGNOSIS — D696 Thrombocytopenia, unspecified: Secondary | ICD-10-CM

## 2020-09-20 DIAGNOSIS — D693 Immune thrombocytopenic purpura: Secondary | ICD-10-CM | POA: Diagnosis not present

## 2020-09-20 LAB — CBC WITH DIFFERENTIAL (CANCER CENTER ONLY)
Abs Immature Granulocytes: 0 10*3/uL (ref 0.00–0.07)
Basophils Absolute: 0 10*3/uL (ref 0.0–0.1)
Basophils Relative: 1 %
Eosinophils Absolute: 0.2 10*3/uL (ref 0.0–0.5)
Eosinophils Relative: 5 %
HCT: 40.3 % (ref 36.0–46.0)
Hemoglobin: 13.5 g/dL (ref 12.0–15.0)
Immature Granulocytes: 0 %
Lymphocytes Relative: 14 %
Lymphs Abs: 0.5 10*3/uL — ABNORMAL LOW (ref 0.7–4.0)
MCH: 30.8 pg (ref 26.0–34.0)
MCHC: 33.5 g/dL (ref 30.0–36.0)
MCV: 92 fL (ref 80.0–100.0)
Monocytes Absolute: 0.5 10*3/uL (ref 0.1–1.0)
Monocytes Relative: 14 %
Neutro Abs: 2.2 10*3/uL (ref 1.7–7.7)
Neutrophils Relative %: 66 %
Platelet Count: 105 10*3/uL — ABNORMAL LOW (ref 150–400)
RBC: 4.38 MIL/uL (ref 3.87–5.11)
RDW: 15.8 % — ABNORMAL HIGH (ref 11.5–15.5)
WBC Count: 3.2 10*3/uL — ABNORMAL LOW (ref 4.0–10.5)
nRBC: 0 % (ref 0.0–0.2)

## 2020-09-20 LAB — CMP (CANCER CENTER ONLY)
ALT: 18 U/L (ref 0–44)
AST: 37 U/L (ref 15–41)
Albumin: 3.6 g/dL (ref 3.5–5.0)
Alkaline Phosphatase: 224 U/L — ABNORMAL HIGH (ref 38–126)
Anion gap: 4 — ABNORMAL LOW (ref 5–15)
BUN: 9 mg/dL (ref 6–20)
CO2: 31 mmol/L (ref 22–32)
Calcium: 9.2 mg/dL (ref 8.9–10.3)
Chloride: 98 mmol/L (ref 98–111)
Creatinine: 0.59 mg/dL (ref 0.44–1.00)
GFR, Estimated: >60 mL/min (ref >60)
Glucose, Bld: 95 mg/dL (ref 70–99)
Potassium: 3.9 mmol/L (ref 3.5–5.1)
Sodium: 133 mmol/L — ABNORMAL LOW (ref 135–145)
Total Bilirubin: 1.6 mg/dL — ABNORMAL HIGH (ref 0.3–1.2)
Total Protein: 6.6 g/dL (ref 6.5–8.1)

## 2020-09-20 NOTE — Progress Notes (Signed)
Labs reviewed with MD, no NPLATE for pt. Pt notified, discharged ambulatory and stable.

## 2020-09-22 ENCOUNTER — Other Ambulatory Visit (HOSPITAL_COMMUNITY): Payer: Self-pay | Admitting: Psychiatry

## 2020-09-22 MED FILL — CLONAZEPAM 1 MG TABS: 1 | 30 days supply | Qty: 60 | Fill #0

## 2020-09-27 ENCOUNTER — Emergency Department (HOSPITAL_BASED_OUTPATIENT_CLINIC_OR_DEPARTMENT_OTHER): Payer: Medicaid Other

## 2020-09-27 ENCOUNTER — Inpatient Hospital Stay: Payer: Medicaid Other

## 2020-09-27 ENCOUNTER — Other Ambulatory Visit (HOSPITAL_BASED_OUTPATIENT_CLINIC_OR_DEPARTMENT_OTHER): Payer: Self-pay | Admitting: Emergency Medicine

## 2020-09-27 ENCOUNTER — Encounter (HOSPITAL_BASED_OUTPATIENT_CLINIC_OR_DEPARTMENT_OTHER): Payer: Self-pay

## 2020-09-27 ENCOUNTER — Emergency Department (HOSPITAL_BASED_OUTPATIENT_CLINIC_OR_DEPARTMENT_OTHER)
Admission: EM | Admit: 2020-09-27 | Discharge: 2020-09-27 | Disposition: A | Discharge status: Home / Self Care | Payer: Medicaid Other | Attending: Emergency Medicine | Admitting: Emergency Medicine

## 2020-09-27 ENCOUNTER — Other Ambulatory Visit: Payer: Self-pay

## 2020-09-27 VITALS — BP 110/76 | HR 91 | Temp 97.9°F | Resp 17

## 2020-09-27 DIAGNOSIS — K7031 Alcoholic cirrhosis of liver with ascites: Secondary | ICD-10-CM

## 2020-09-27 DIAGNOSIS — S299XXA Unspecified injury of thorax, initial encounter: Secondary | ICD-10-CM | POA: Diagnosis present

## 2020-09-27 DIAGNOSIS — S2242XA Multiple fractures of ribs, left side, initial encounter for closed fracture: Secondary | ICD-10-CM | POA: Diagnosis not present

## 2020-09-27 DIAGNOSIS — R1012 Left upper quadrant pain: Secondary | ICD-10-CM | POA: Diagnosis not present

## 2020-09-27 DIAGNOSIS — Y92009 Unspecified place in unspecified non-institutional (private) residence as the place of occurrence of the external cause: Secondary | ICD-10-CM | POA: Insufficient documentation

## 2020-09-27 DIAGNOSIS — K7460 Unspecified cirrhosis of liver: Secondary | ICD-10-CM | POA: Diagnosis not present

## 2020-09-27 DIAGNOSIS — W01190A Fall on same level from slipping, tripping and stumbling with subsequent striking against furniture, initial encounter: Secondary | ICD-10-CM | POA: Diagnosis not present

## 2020-09-27 DIAGNOSIS — D696 Thrombocytopenia, unspecified: Secondary | ICD-10-CM

## 2020-09-27 DIAGNOSIS — W19XXXA Unspecified fall, initial encounter: Secondary | ICD-10-CM

## 2020-09-27 DIAGNOSIS — D693 Immune thrombocytopenic purpura: Secondary | ICD-10-CM | POA: Diagnosis not present

## 2020-09-27 LAB — CBC WITH DIFFERENTIAL (CANCER CENTER ONLY)
Abs Immature Granulocytes: 0.01 10*3/uL (ref 0.00–0.07)
Basophils Absolute: 0 10*3/uL (ref 0.0–0.1)
Basophils Relative: 0 %
Eosinophils Absolute: 0.1 10*3/uL (ref 0.0–0.5)
Eosinophils Relative: 3 %
HCT: 44 % (ref 36.0–46.0)
Hemoglobin: 14.5 g/dL (ref 12.0–15.0)
Immature Granulocytes: 0 %
Lymphocytes Relative: 16 %
Lymphs Abs: 0.4 10*3/uL — ABNORMAL LOW (ref 0.7–4.0)
MCH: 30.9 pg (ref 26.0–34.0)
MCHC: 33 g/dL (ref 30.0–36.0)
MCV: 93.6 fL (ref 80.0–100.0)
Monocytes Absolute: 0.3 10*3/uL (ref 0.1–1.0)
Monocytes Relative: 10 %
Neutro Abs: 1.9 10*3/uL (ref 1.7–7.7)
Neutrophils Relative %: 71 %
Platelet Count: 74 10*3/uL — ABNORMAL LOW (ref 150–400)
RBC: 4.7 MIL/uL (ref 3.87–5.11)
RDW: 16 % — ABNORMAL HIGH (ref 11.5–15.5)
WBC Count: 2.7 10*3/uL — ABNORMAL LOW (ref 4.0–10.5)
nRBC: 0 % (ref 0.0–0.2)

## 2020-09-27 LAB — CMP (CANCER CENTER ONLY)
ALT: 28 U/L (ref 0–44)
AST: 57 U/L — ABNORMAL HIGH (ref 15–41)
Albumin: 3.8 g/dL (ref 3.5–5.0)
Alkaline Phosphatase: 291 U/L — ABNORMAL HIGH (ref 38–126)
Anion gap: 7 (ref 5–15)
BUN: 9 mg/dL (ref 6–20)
CO2: 27 mmol/L (ref 22–32)
Calcium: 10 mg/dL (ref 8.9–10.3)
Chloride: 103 mmol/L (ref 98–111)
Creatinine: 0.67 mg/dL (ref 0.44–1.00)
GFR, Estimated: >60 mL/min (ref >60)
Glucose, Bld: 120 mg/dL — ABNORMAL HIGH (ref 70–99)
Potassium: 4.6 mmol/L (ref 3.5–5.1)
Sodium: 137 mmol/L (ref 135–145)
Total Bilirubin: 1.4 mg/dL — ABNORMAL HIGH (ref 0.3–1.2)
Total Protein: 7.6 g/dL (ref 6.5–8.1)

## 2020-09-27 MED ORDER — OXYCODONE HCL 5 MG PO TABS: 5.0000 mg | ORAL_TABLET | Freq: Four times a day (QID) | ORAL | 0 refills | Status: DC | PRN

## 2020-09-27 MED ORDER — ROMIPLOSTIM 250 MCG ~~LOC~~ SOLR
195.0000 ug | Freq: Once | SUBCUTANEOUS | Status: AC
  Administered 2020-09-27: 195 ug via SUBCUTANEOUS
  Filled 2020-09-27: qty 0.39

## 2020-09-27 MED ORDER — IOHEXOL 300 MG/ML  SOLN
100.0000 mL | Freq: Once | INTRAMUSCULAR | Status: AC | PRN
  Administered 2020-09-27: 100 mL via INTRAVENOUS

## 2020-09-27 MED FILL — oxyCODONE HCL 5 MG TABS: 5 | 2 days supply | Qty: 8 | Fill #0

## 2020-09-27 NOTE — ED Provider Notes (Signed)
[facility] EMERGENCY DEPARTMENT Provider Note   CSN: [id number] Arrival date & time: 09/27/20  1203     History Chief Complaint  Patient presents with  . Rib Injury    Briana Sweeney is a 50 y.o. female.  HPI Patient presents with left-sided rib and abdominal pain.  States a week ago she walked into a table and has had severe left rib and abdominal pain since.  States that her abdomen has become more swollen and feels bloated.  No nausea or vomiting.  No difficulty breathing.  States she is have difficulty sleeping because of the pain on left side.  Patient is worried because she has low platelets.  Gets weekly injections to help with them.  States she does feel little lightheaded.  No cough.  No hemoptysis.    Past Medical History:  Diagnosis Date  . Alcohol use   . Anxiety   . Bipolar affective disorder ([place])    With anxiety features  . Bunionette of right foot 11/2019  . Cirrhosis of liver ([place])    Due to alcohol and hepatitis C  . Depression   . 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 ETOHism ([place])   . GERD (gastroesophageal reflux disease)   . Hematemesis 02/10/2018  . Hepatitis C 2018   hepatitis c and alcohol related hepatitis  . Heroin abuse ([place])   . History of blood transfusion    "blood doesn't clot; I fell down and had to have a transfusion"  . History of kidney stones   . History of thrombocytopenia   . Menopause 2016  . Migraine    "when I get really stressed" (09/01/2017)  . S/P foot surgery, right 11/2019  . Schizophrenia ([place])   . Seizures ([place])    "when I run out of my RX; lots recently" (09/01/2017)  . Thrombocytopenia ([place])   . Urinary urgency 05/2020    Patient Active Problem List   Diagnosis Date Noted  . Episodic mood disorder ([place]) 08/08/2020  . Left leg pain 03/28/2020  . History of recent fall 03/28/2020  . Abnormal urine 12/13/2019  . History of alcohol abuse 09/07/2019  . Insomnia 05/05/2019  . Dizziness  04/02/2019  . Bruising 04/02/2019  . Hyponatremia 04/02/2019  . Head injury   . Scalp laceration, initial encounter   . Fall 01/20/2019  . Epistaxis 01/20/2019  . Altered mental status   . 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 Other mixed anxiety disorders 10/27/2018  . Neuropathy 10/27/2018  . Abscess of bursa of left elbow 09/23/2018  . Olecranon bursitis of left elbow   . Erysipelas 09/13/2018  . Acute metabolic encephalopathy 60/63/0160  . Overdose 08/22/2018  . Hypotension 08/22/2018  . Seizure ([place]) 08/22/2018  . Substance induced mood disorder ([place]) 07/17/2018  . Cocaine dependence without complication ([place]) 10/93/2355  . Polysubstance abuse ([place]) 04/08/2018  . 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 Hepatitis C 03/18/2018  . Portal hypertension ([place]) 03/18/2018  . Alcoholic intoxication without complication ([place]) 73/22/0254  . Depression 03/18/2018  . Upper GI bleed 02/10/2018  . Alcohol use disorder, severe, in early remission ([place]) 02/10/2018  . Chronic anemia   . Thrombocytopenia due to drugs   . Suicidal ideation   . Thrombocytopenia ([place]) 01/02/2018  . GERD (gastroesophageal reflux disease) 11/01/2017  . Trichimoniasis 10/30/2017  . Alcohol dependence ([place]) 10/29/2017  . Acute hyperactive alcohol withdrawal delirium ([place]) 10/09/2017  . Alcohol abuse with alcohol-induced mood disorder ([place])   .  Alcohol withdrawal syndrome with complication ([place]) [id number]  . Esophageal varices without bleeding ([place])   . Hematemesis 09/01/2017  . Ascites due to alcoholic cirrhosis ([place])   . Decompensated hepatic cirrhosis ([place]) 07/23/2017  . Alcohol abuse 07/23/2017  . Hepatitis C antibody positive in blood 07/23/2017  . Hypokalemia 07/23/2017  . Jaundice 07/23/2017  . Coagulopathy ([place]) 07/23/2017  . Hypomagnesemia 07/23/2017  . Pancytopenia ([place]) 07/23/2017  . Alcoholic cirrhosis of liver with ascites ([place]) 07/23/2017  . Bacterial vaginosis 06/03/2017   . UTI (urinary tract infection) 06/02/2017    Past Surgical History:  Procedure Laterality Date  . ESOPHAGOGASTRODUODENOSCOPY N/A 09/03/2017   Procedure: ESOPHAGOGASTRODUODENOSCOPY (EGD);  Surgeon: Doran Stabler, MD;  Location: [facility];  Service: Gastroenterology;  Laterality: N/A;  . FINGER FRACTURE SURGERY Left    "shattered my pinky"  . FRACTURE SURGERY    . I & D EXTREMITY Left 09/18/2018   Procedure: IRRIGATION AND DEBRIDEMENT EXTREMITY;  Surgeon: Leanora Cover, MD;  Location: [facility];  Service: Orthopedics;  Laterality: Left;  . IR PARACENTESIS  07/23/2017  . IR PARACENTESIS  07/2017   "did it twice in the same week" (09/01/2017)  . SHOULDER OPEN ROTATOR CUFF REPAIR Right   . TOOTH EXTRACTION  08/2019  . TUBAL LIGATION    . VAGINAL HYSTERECTOMY       OB History    Gravida  3   Para      Term      Preterm      AB      Living  2     SAB      IAB      Ectopic      Multiple      Live Births              Family History  Problem Relation Age of Onset  . Lung cancer Mother 36  . Alcohol abuse Mother   . Throat cancer Father 73    Social History   Tobacco Use  . Smoking status: Never Smoker  . Smokeless tobacco: Never Used  Vaping Use  . Vaping Use: Never used  Substance Use Topics  . Alcohol use: Yes    Alcohol/week: 6.0 standard drinks    Types: 6 Cans of beer per week    Comment: weekly  . Drug use: Not Currently    Home Medications Prior to Admission medications   Medication Sig Start Date End Date Taking? Authorizing Provider  ARIPiprazole (ABILIFY) 10 MG tablet TAKE 1 TABELT DAILY 09/13/20   Pucilowski, Olgierd A, MD  clonazePAM (KLONOPIN) 1 MG tablet TAKE 1 TABLET BY MOUTH TWO TIMES DAILY AS NEEDED FOR ANXIETY 09/22/20   Pucilowski, Olgierd A, MD  diclofenac Sodium (VOLTAREN) 1 % GEL Apply 4 g topically 4 (four) times daily. 05/26/20   [name], FNP  FLUoxetine (PROZAC) 40 MG capsule Take 1 capsule (40 mg total) by mouth  daily. 08/08/20 02/04/21  Pucilowski, Marchia Bond, MD  furosemide (LASIX) 40 MG tablet Take 1 tablet (40 mg total) by mouth daily. 12/13/19 08/09/20  [name], FNP  gabapentin (NEURONTIN) 300 MG capsule Take 2 capsules (600 mg), by mouth, 3 times a day. 01/04/20   [name], FNP  lactulose (CHRONULAC) 10 GM/15ML solution Take 30 mLs (20 g total) by mouth 3 (three) times daily. Goal to have 2-3 BMs daily 05/10/20   Volanda Napoleon, MD  levETIRAcetam (KEPPRA) 500 MG tablet Take 1 tablet (500 mg  total) by mouth 2 (two) times daily. 06/20/20   Cameron Sprang, MD  mirtazapine (REMERON) 30 MG tablet Take 1 tablet (30 mg total) by mouth at bedtime. 08/08/20 02/04/21  Pucilowski, Marchia Bond, MD  Multiple Vitamin (MULTIVITAMIN WITH MINERALS) TABS tablet Take 1 tablet by mouth daily. 04/13/19   Hosie Poisson, MD  oxyCODONE (ROXICODONE) 5 MG immediate release tablet Take 1 tablet (5 mg total) by mouth every 6 (six) hours as needed for severe pain. 09/27/20   Davonna Belling, MD  polyethylene glycol (MIRALAX / GLYCOLAX) 17 g packet Take 17 g by mouth 2 (two) times daily. Patient taking differently: Take 17 g by mouth daily as needed for mild constipation. 04/13/19   Hosie Poisson, MD    Allergies    Other  Review of Systems   Review of Systems  Constitutional: Negative for appetite change.  HENT: Negative for congestion.   Respiratory: Negative for shortness of breath.   Cardiovascular: Positive for chest pain.  Gastrointestinal: Positive for abdominal pain. Negative for diarrhea, nausea and vomiting.  Genitourinary: Negative for flank pain and hematuria.  Musculoskeletal: Negative for back pain.  Skin: Negative for wound.  Neurological: Negative for weakness.  Psychiatric/Behavioral: Negative for confusion.    Physical Exam Updated Vital Signs BP 125/90   Pulse 90   Temp 98.2 F (36.8 C) (Oral)   Resp 18   Ht 4\' 11"  (1.499 m)   Wt 61.2 kg   SpO2 100%   BMI 27.27 kg/m   Physical  Exam Vitals and nursing note reviewed.  HENT:     Head: Atraumatic.  Eyes:     Extraocular Movements: Extraocular movements intact.     Pupils: Pupils are equal, round, and reactive to light.  Cardiovascular:     Rate and Rhythm: Regular rhythm.  Pulmonary:     Comments: Tenderness to left lateral lower chest wall.  No crepitance or deformity.  No rash.  No subcu emphysema. Chest:     Chest wall: Tenderness present.  Abdominal:     Tenderness: There is abdominal tenderness.     Comments: Left upper quadrant tenderness without rebound or guarding.  No ecchymosis.  Musculoskeletal:        General: No tenderness.     Cervical back: Neck supple.  Skin:    General: Skin is warm.     Capillary Refill: Capillary refill takes less than 2 seconds.  Neurological:     Mental Status: She is alert and oriented to person, place, and time.  Psychiatric:        Mood and Affect: Mood normal.     ED Results / Procedures / Treatments   Labs (all labs ordered are listed, but only abnormal results are displayed) Labs Reviewed - No data to display  EKG None  Radiology No results found.  Procedures Procedures (including critical care time)  Medications Ordered in ED Medications  iohexol (OMNIPAQUE) 300 MG/ML solution 100 mL (100 mLs Intravenous Contrast Given 09/27/20 1247)    ED Course  I have reviewed the triage vital signs and the nursing notes.  Pertinent labs & imaging results that were available during my care of the patient were reviewed by me and considered in my medical decision making (see chart for details).    MDM Rules/Calculators/A&P                          Patient with fall around a week ago.  Now worsening  left-sided abdominal/lower chest pain.  History of thrombocytopenia and cirrhosis.  Does have abdominal tenderness.  CT scan done did not show intra-abdominal injury although does have some chronic changes related to cirrhosis.  Does have 2 rib fractures however.   No pneumothorax.  Will discharge with pain medicine.  Not hypoxic.  I think patient stable for discharge home with outpatient follow-up. Final Clinical Impression(s) / ED Diagnoses Final diagnoses:  Closed fracture of multiple ribs of left side, initial encounter  Fall, initial encounter    Rx / DC Orders ED Discharge Orders         Ordered    oxyCODONE (ROXICODONE) 5 MG immediate release tablet  Every 6 hours PRN,   Status:  Discontinued        09/27/20 1443    oxyCODONE (ROXICODONE) 5 MG immediate release tablet  Every 6 hours PRN        09/27/20 1454           Davonna Belling, MD 09/27/20 1523

## 2020-09-27 NOTE — Patient Instructions (Signed)
Romiplostim injection What is this medicine? ROMIPLOSTIM (roe mi PLOE stim) helps your body make more platelets. This medicine is used to treat low platelets caused by chronic idiopathic thrombocytopenic purpura (ITP). This medicine may be used for other purposes; ask your health care provider or pharmacist if you have questions. COMMON BRAND NAME(S): Nplate What should I tell my health care provider before I take this medicine? They need to know if you have any of these conditions:  bleeding disorders  bone marrow problem, like blood cancer or myelodysplastic syndrome  history of blood clots  liver disease  surgery to remove your spleen  an unusual or allergic reaction to romiplostim, mannitol, other medicines, foods, dyes, or preservatives  pregnant or trying to get pregnant  breast-feeding How should I use this medicine? This medicine is for injection under the skin. It is given by a health care professional in a hospital or clinic setting. A special MedGuide will be given to you before your injection. Read this information carefully each time. Talk to your pediatrician regarding the use of this medicine in children. While this drug may be prescribed for children as young as 1 year for selected conditions, precautions do apply. Overdosage: If you think you have taken too much of this medicine contact a poison control center or emergency room at once. NOTE: This medicine is only for you. Do not share this medicine with others. What if I miss a dose? It is important not to miss your dose. Call your doctor or health care professional if you are unable to keep an appointment. What may interact with this medicine? Interactions are not expected. This list may not describe all possible interactions. Give your health care provider a list of all the medicines, herbs, non-prescription drugs, or dietary supplements you use. Also tell them if you smoke, drink alcohol, or use illegal drugs.  Some items may interact with your medicine. What should I watch for while using this medicine? Your condition will be monitored carefully while you are receiving this medicine. Visit your prescriber or health care professional for regular checks on your progress and for the needed blood tests. It is important to keep all appointments. What side effects may I notice from receiving this medicine? Side effects that you should report to your doctor or health care professional as soon as possible:  allergic reactions like skin rash, itching or hives, swelling of the face, lips, or tongue  signs and symptoms of bleeding such as bloody or black, tarry stools; red or dark brown urine; spitting up blood or brown material that looks like coffee grounds; red spots on the skin; unusual bruising or bleeding from the eyes, gums, or nose  signs and symptoms of a blood clot such as chest pain; shortness of breath; pain, swelling, or warmth in the leg  signs and symptoms of a stroke like changes in vision; confusion; trouble speaking or understanding; severe headaches; sudden numbness or weakness of the face, arm or leg; trouble walking; dizziness; loss of balance or coordination Side effects that usually do not require medical attention (report to your doctor or health care professional if they continue or are bothersome):  headache  pain in arms and legs  pain in mouth  stomach pain This list may not describe all possible side effects. Call your doctor for medical advice about side effects. You may report side effects to FDA at 1-800-FDA-1088. Where should I keep my medicine? This drug is given in a hospital or clinic   and will not be stored at home. NOTE: This sheet is a summary. It may not cover all possible information. If you have questions about this medicine, talk to your doctor, pharmacist, or health care provider.  2020 Elsevier/Gold Standard (2017-08-25 11:10:55)  

## 2020-09-27 NOTE — ED Triage Notes (Addendum)
Pt states she fell into a table last week-injured left ribs area-states she is having abd swelling and lower back pain x 3 days-concerned due to hx of "low platelets"-pt seen at Onc today and received weekly injection-NAD-steady gait

## 2020-09-27 NOTE — ED Notes (Signed)
Patient transported to CT 

## 2020-09-28 ENCOUNTER — Encounter (HOSPITAL_COMMUNITY): Payer: Self-pay

## 2020-09-28 ENCOUNTER — Emergency Department (HOSPITAL_COMMUNITY)
Admission: EM | Admit: 2020-09-28 | Discharge: 2020-09-28 | Disposition: A | Discharge status: Home / Self Care | Payer: Medicaid Other | Attending: Emergency Medicine | Admitting: Emergency Medicine

## 2020-09-28 DIAGNOSIS — R0781 Pleurodynia: Secondary | ICD-10-CM | POA: Insufficient documentation

## 2020-09-28 DIAGNOSIS — Z5321 Procedure and treatment not carried out due to patient leaving prior to being seen by health care provider: Secondary | ICD-10-CM | POA: Diagnosis not present

## 2020-09-28 DIAGNOSIS — W009XXD Unspecified fall due to ice and snow, subsequent encounter: Secondary | ICD-10-CM | POA: Diagnosis not present

## 2020-09-28 NOTE — ED Triage Notes (Signed)
Pt arrived via EMS, from eye doctors office, arrived with worsening rib pain, was seen yesterday, dx with broken ribs x2 after slip and fall on ice. States she has not been able to take prescribed oxy for pain as it makes her vomit.

## 2020-09-29 MED FILL — FLUoxetine HCL 40 MG CAPS: 40 | 30 days supply | Qty: 30 | Fill #1

## 2020-10-02 DIAGNOSIS — D649 Anemia, unspecified: Secondary | ICD-10-CM

## 2020-10-03 ENCOUNTER — Telehealth: Payer: Self-pay | Admitting: *Deleted

## 2020-10-03 NOTE — Telephone Encounter (Signed)
Call received from [facility] to inform Dr. Marin Olp that pt has been admitted for pancreatitis.  Dr. Marin Olp notified.

## 2020-10-04 ENCOUNTER — Inpatient Hospital Stay: Payer: Medicaid Other

## 2020-10-09 ENCOUNTER — Other Ambulatory Visit: Payer: Self-pay | Admitting: Family Medicine

## 2020-10-09 DIAGNOSIS — M21621 Bunionette of right foot: Secondary | ICD-10-CM

## 2020-10-09 DIAGNOSIS — M79671 Pain in right foot: Secondary | ICD-10-CM

## 2020-10-09 DIAGNOSIS — Z9889 Other specified postprocedural states: Secondary | ICD-10-CM

## 2020-10-09 MED FILL — GABAPENTIN 300 MG CAPSULE: 300 | 15 days supply | Qty: 90 | Fill #0

## 2020-10-09 MED FILL — ARIPIPRAZOLE 10 MG TABS: 10 | 30 days supply | Qty: 30 | Fill #1

## 2020-10-09 NOTE — Telephone Encounter (Signed)
Is this okay to refill? 

## 2020-10-11 ENCOUNTER — Other Ambulatory Visit (HOSPITAL_COMMUNITY): Payer: Self-pay | Admitting: Psychiatry

## 2020-10-11 ENCOUNTER — Inpatient Hospital Stay: Payer: Medicaid Other | Attending: Hematology & Oncology

## 2020-10-11 ENCOUNTER — Telehealth (HOSPITAL_COMMUNITY): Payer: Self-pay | Admitting: *Deleted

## 2020-10-11 ENCOUNTER — Inpatient Hospital Stay: Payer: Medicaid Other

## 2020-10-11 ENCOUNTER — Other Ambulatory Visit: Payer: Self-pay

## 2020-10-11 DIAGNOSIS — D696 Thrombocytopenia, unspecified: Secondary | ICD-10-CM

## 2020-10-11 DIAGNOSIS — D693 Immune thrombocytopenic purpura: Secondary | ICD-10-CM | POA: Insufficient documentation

## 2020-10-11 DIAGNOSIS — K7031 Alcoholic cirrhosis of liver with ascites: Secondary | ICD-10-CM

## 2020-10-11 LAB — CBC WITH DIFFERENTIAL (CANCER CENTER ONLY)
Abs Immature Granulocytes: 0 10*3/uL (ref 0.00–0.07)
Basophils Absolute: 0 10*3/uL (ref 0.0–0.1)
Basophils Relative: 1 %
Eosinophils Absolute: 0.2 10*3/uL (ref 0.0–0.5)
Eosinophils Relative: 5 %
HCT: 46.2 % — ABNORMAL HIGH (ref 36.0–46.0)
Hemoglobin: 15.1 g/dL — ABNORMAL HIGH (ref 12.0–15.0)
Immature Granulocytes: 0 %
Lymphocytes Relative: 20 %
Lymphs Abs: 0.6 10*3/uL — ABNORMAL LOW (ref 0.7–4.0)
MCH: 30.9 pg (ref 26.0–34.0)
MCHC: 32.7 g/dL (ref 30.0–36.0)
MCV: 94.5 fL (ref 80.0–100.0)
Monocytes Absolute: 0.3 10*3/uL (ref 0.1–1.0)
Monocytes Relative: 9 %
Neutro Abs: 2 10*3/uL (ref 1.7–7.7)
Neutrophils Relative %: 65 %
Platelet Count: 100 10*3/uL — ABNORMAL LOW (ref 150–400)
RBC: 4.89 MIL/uL (ref 3.87–5.11)
RDW: 15.7 % — ABNORMAL HIGH (ref 11.5–15.5)
WBC Count: 3.2 10*3/uL — ABNORMAL LOW (ref 4.0–10.5)
nRBC: 0 % (ref 0.0–0.2)

## 2020-10-11 LAB — CMP (CANCER CENTER ONLY)
ALT: 44 U/L (ref 0–44)
AST: 85 U/L — ABNORMAL HIGH (ref 15–41)
Albumin: 3.6 g/dL (ref 3.5–5.0)
Alkaline Phosphatase: 244 U/L — ABNORMAL HIGH (ref 38–126)
Anion gap: 5 (ref 5–15)
BUN: 8 mg/dL (ref 6–20)
CO2: 30 mmol/L (ref 22–32)
Calcium: 9.3 mg/dL (ref 8.9–10.3)
Chloride: 104 mmol/L (ref 98–111)
Creatinine: 0.64 mg/dL (ref 0.44–1.00)
GFR, Estimated: >60 mL/min (ref >60)
Glucose, Bld: 105 mg/dL — ABNORMAL HIGH (ref 70–99)
Potassium: 3.9 mmol/L (ref 3.5–5.1)
Sodium: 139 mmol/L (ref 135–145)
Total Bilirubin: 1.3 mg/dL — ABNORMAL HIGH (ref 0.3–1.2)
Total Protein: 7.2 g/dL (ref 6.5–8.1)

## 2020-10-11 MED ORDER — LORAZEPAM 2 MG PO TABS: 2.0000 mg | ORAL_TABLET | Freq: Two times a day (BID) | ORAL | 1 refills | Status: DC | PRN

## 2020-10-11 NOTE — Progress Notes (Signed)
No nplate today per Dr. Ennever. 

## 2020-10-11 NOTE — Telephone Encounter (Signed)
Patient has called and says she is having a hard time and wants her Ativan back.  Please review.

## 2020-10-11 NOTE — Telephone Encounter (Signed)
Seems like she could not make up her mind if she wants clonazepam or lorazepam. I cancelled refills on Klonopin and ordered Ativan.

## 2020-10-12 ENCOUNTER — Other Ambulatory Visit (HOSPITAL_COMMUNITY): Payer: Self-pay | Admitting: Psychiatry

## 2020-10-12 NOTE — Telephone Encounter (Signed)
I agree she cant make up her mind. Thanks.

## 2020-10-13 ENCOUNTER — Other Ambulatory Visit (HOSPITAL_COMMUNITY): Payer: Self-pay | Admitting: Psychiatry

## 2020-10-16 ENCOUNTER — Other Ambulatory Visit (HOSPITAL_COMMUNITY): Payer: Self-pay | Admitting: *Deleted

## 2020-10-16 ENCOUNTER — Telehealth (HOSPITAL_COMMUNITY): Payer: Self-pay | Admitting: *Deleted

## 2020-10-16 MED ORDER — LORAZEPAM 2 MG PO TABS: 2.0000 mg | ORAL_TABLET | Freq: Two times a day (BID) | ORAL | 2 refills | Status: DC | PRN

## 2020-10-16 MED FILL — LORazepam 2 MG TABS: 2 | 30 days supply | Qty: 60 | Fill #0

## 2020-10-16 NOTE — Telephone Encounter (Signed)
Patient called and stated Ativan could not be filled at [facility] because her insurance would not pay for it to be filled there.  Canceled the script at [facility] and phoned it in to [facility] as requested.  FYI.

## 2020-10-18 ENCOUNTER — Inpatient Hospital Stay: Payer: Medicaid Other

## 2020-10-18 ENCOUNTER — Other Ambulatory Visit: Payer: Self-pay

## 2020-10-18 VITALS — BP 116/80 | HR 79 | Temp 98.0°F | Resp 18

## 2020-10-18 DIAGNOSIS — K7031 Alcoholic cirrhosis of liver with ascites: Secondary | ICD-10-CM

## 2020-10-18 DIAGNOSIS — D693 Immune thrombocytopenic purpura: Secondary | ICD-10-CM | POA: Diagnosis not present

## 2020-10-18 DIAGNOSIS — D696 Thrombocytopenia, unspecified: Secondary | ICD-10-CM

## 2020-10-18 LAB — CBC WITH DIFFERENTIAL (CANCER CENTER ONLY)
Abs Immature Granulocytes: 0.01 10*3/uL (ref 0.00–0.07)
Basophils Absolute: 0 10*3/uL (ref 0.0–0.1)
Basophils Relative: 0 %
Eosinophils Absolute: 0.1 10*3/uL (ref 0.0–0.5)
Eosinophils Relative: 2 %
HCT: 42.9 % (ref 36.0–46.0)
Hemoglobin: 14.3 g/dL (ref 12.0–15.0)
Immature Granulocytes: 0 %
Lymphocytes Relative: 26 %
Lymphs Abs: 0.8 10*3/uL (ref 0.7–4.0)
MCH: 31.1 pg (ref 26.0–34.0)
MCHC: 33.3 g/dL (ref 30.0–36.0)
MCV: 93.3 fL (ref 80.0–100.0)
Monocytes Absolute: 0.3 10*3/uL (ref 0.1–1.0)
Monocytes Relative: 10 %
Neutro Abs: 2 10*3/uL (ref 1.7–7.7)
Neutrophils Relative %: 62 %
Platelet Count: 44 10*3/uL — ABNORMAL LOW (ref 150–400)
RBC: 4.6 MIL/uL (ref 3.87–5.11)
RDW: 15.5 % (ref 11.5–15.5)
WBC Count: 3.2 10*3/uL — ABNORMAL LOW (ref 4.0–10.5)
nRBC: 0 % (ref 0.0–0.2)

## 2020-10-18 LAB — CMP (CANCER CENTER ONLY)
ALT: 24 U/L (ref 0–44)
AST: 33 U/L (ref 15–41)
Albumin: 3.9 g/dL (ref 3.5–5.0)
Alkaline Phosphatase: 215 U/L — ABNORMAL HIGH (ref 38–126)
Anion gap: 7 (ref 5–15)
BUN: 7 mg/dL (ref 6–20)
CO2: 27 mmol/L (ref 22–32)
Calcium: 9.8 mg/dL (ref 8.9–10.3)
Chloride: 105 mmol/L (ref 98–111)
Creatinine: 0.69 mg/dL (ref 0.44–1.00)
GFR, Estimated: >60 mL/min (ref >60)
Glucose, Bld: 98 mg/dL (ref 70–99)
Potassium: 4.4 mmol/L (ref 3.5–5.1)
Sodium: 139 mmol/L (ref 135–145)
Total Bilirubin: 1.1 mg/dL (ref 0.3–1.2)
Total Protein: 7.3 g/dL (ref 6.5–8.1)

## 2020-10-18 MED ORDER — ROMIPLOSTIM 250 MCG ~~LOC~~ SOLR
195.0000 ug | Freq: Once | SUBCUTANEOUS | Status: AC
  Administered 2020-10-18: 195 ug via SUBCUTANEOUS
  Filled 2020-10-18: qty 0.39

## 2020-10-18 NOTE — Patient Instructions (Signed)
Romiplostim injection What is this medicine? ROMIPLOSTIM (roe mi PLOE stim) helps your body make more platelets. This medicine is used to treat low platelets caused by chronic idiopathic thrombocytopenic purpura (ITP). This medicine may be used for other purposes; ask your health care provider or pharmacist if you have questions. COMMON BRAND NAME(S): Nplate What should I tell my health care provider before I take this medicine? They need to know if you have any of these conditions:  bleeding disorders  bone marrow problem, like blood cancer or myelodysplastic syndrome  history of blood clots  liver disease  surgery to remove your spleen  an unusual or allergic reaction to romiplostim, mannitol, other medicines, foods, dyes, or preservatives  pregnant or trying to get pregnant  breast-feeding How should I use this medicine? This medicine is for injection under the skin. It is given by a health care professional in a hospital or clinic setting. A special MedGuide will be given to you before your injection. Read this information carefully each time. Talk to your pediatrician regarding the use of this medicine in children. While this drug may be prescribed for children as young as 1 year for selected conditions, precautions do apply. Overdosage: If you think you have taken too much of this medicine contact a poison control center or emergency room at once. NOTE: This medicine is only for you. Do not share this medicine with others. What if I miss a dose? It is important not to miss your dose. Call your doctor or health care professional if you are unable to keep an appointment. What may interact with this medicine? Interactions are not expected. This list may not describe all possible interactions. Give your health care provider a list of all the medicines, herbs, non-prescription drugs, or dietary supplements you use. Also tell them if you smoke, drink alcohol, or use illegal drugs.  Some items may interact with your medicine. What should I watch for while using this medicine? Your condition will be monitored carefully while you are receiving this medicine. Visit your prescriber or health care professional for regular checks on your progress and for the needed blood tests. It is important to keep all appointments. What side effects may I notice from receiving this medicine? Side effects that you should report to your doctor or health care professional as soon as possible:  allergic reactions like skin rash, itching or hives, swelling of the face, lips, or tongue  signs and symptoms of bleeding such as bloody or black, tarry stools; red or dark brown urine; spitting up blood or brown material that looks like coffee grounds; red spots on the skin; unusual bruising or bleeding from the eyes, gums, or nose  signs and symptoms of a blood clot such as chest pain; shortness of breath; pain, swelling, or warmth in the leg  signs and symptoms of a stroke like changes in vision; confusion; trouble speaking or understanding; severe headaches; sudden numbness or weakness of the face, arm or leg; trouble walking; dizziness; loss of balance or coordination Side effects that usually do not require medical attention (report to your doctor or health care professional if they continue or are bothersome):  headache  pain in arms and legs  pain in mouth  stomach pain This list may not describe all possible side effects. Call your doctor for medical advice about side effects. You may report side effects to FDA at 1-800-FDA-1088. Where should I keep my medicine? This drug is given in a hospital or clinic   and will not be stored at home. NOTE: This sheet is a summary. It may not cover all possible information. If you have questions about this medicine, talk to your doctor, pharmacist, or health care provider.  2020 Elsevier/Gold Standard (2017-08-25 11:10:55)  

## 2020-10-20 ENCOUNTER — Ambulatory Visit (INDEPENDENT_AMBULATORY_CARE_PROVIDER_SITE_OTHER): Payer: Medicaid Other | Admitting: Family Medicine

## 2020-10-20 ENCOUNTER — Other Ambulatory Visit: Payer: Self-pay | Admitting: Family Medicine

## 2020-10-20 ENCOUNTER — Other Ambulatory Visit: Payer: Self-pay

## 2020-10-20 ENCOUNTER — Encounter: Payer: Self-pay | Admitting: Family Medicine

## 2020-10-20 DIAGNOSIS — K852 Alcohol induced acute pancreatitis without necrosis or infection: Secondary | ICD-10-CM | POA: Diagnosis not present

## 2020-10-20 DIAGNOSIS — D696 Thrombocytopenia, unspecified: Secondary | ICD-10-CM | POA: Diagnosis not present

## 2020-10-20 DIAGNOSIS — F419 Anxiety disorder, unspecified: Secondary | ICD-10-CM

## 2020-10-20 DIAGNOSIS — Z09 Encounter for follow-up examination after completed treatment for conditions other than malignant neoplasm: Secondary | ICD-10-CM

## 2020-10-20 DIAGNOSIS — F1011 Alcohol abuse, in remission: Secondary | ICD-10-CM | POA: Diagnosis not present

## 2020-10-20 MED ORDER — FAMOTIDINE 20 MG PO TABS: 20.0000 mg | ORAL_TABLET | Freq: Every day | ORAL | 1 refills | Status: DC

## 2020-10-20 MED ORDER — FUROSEMIDE 40 MG PO TABS
40.0000 mg | ORAL_TABLET | Freq: Every day | ORAL | 0 refills | Status: DC
Start: 2020-10-20 — End: 2020-10-20

## 2020-10-20 MED FILL — FUROSEMIDE 40 MG TAB: 40 | 90 days supply | Qty: 90 | Fill #0

## 2020-10-20 MED FILL — FAMOTIDINE 20 MG TABLET: 20 | 90 days supply | Qty: 90 | Fill #0

## 2020-10-20 NOTE — Progress Notes (Signed)
Virtual Visit via Telephone Note  I connected with Briana Sweeney on 10/20/20 at  8:00 AM EST by telephone and verified that I am speaking with the correct person using two identifiers.  Location: Patient: Home Provider: Office   I discussed the limitations, risks, security and privacy concerns of performing an evaluation and management service by telephone and the availability of in person appointments. I also discussed with the patient that there may be a patient responsible charge related to this service. The patient expressed understanding and agreed to proceed.   History of Present Illness:  Telephone Virtual Visit   Observations/Objective:  Patient Active Problem List   Diagnosis Date Noted  . Episodic mood disorder ([place]) 08/08/2020  . Left leg pain 03/28/2020  . History of recent fall 03/28/2020  . Abnormal urine 12/13/2019  . History of alcohol abuse 09/07/2019  . Insomnia 05/05/2019  . Dizziness 04/02/2019  . Bruising 04/02/2019  . Hyponatremia 04/02/2019  . Head injury   . Scalp laceration, initial encounter   . Fall 01/20/2019  . Epistaxis 01/20/2019  . Altered mental status   . 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 Other mixed anxiety disorders 10/27/2018  . Neuropathy 10/27/2018  . Abscess of bursa of left elbow 09/23/2018  . Olecranon bursitis of left elbow   . Erysipelas 09/13/2018  . Acute metabolic encephalopathy 62/95/2841  . Overdose 08/22/2018  . Hypotension 08/22/2018  . Seizure ([place]) 08/22/2018  . Substance induced mood disorder ([place]) 07/17/2018  . Cocaine dependence without complication ([place]) 32/44/0102  . Polysubstance abuse ([place]) 04/08/2018  . Encephalopathy, 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[place]) 04/08/2018  . Hepatitis C 03/18/2018  . Portal hypertension ([place]) 03/18/2018  . Alcoholic intoxication without complication ([place]) 72/53/6644  . Depression 03/18/2018  . Upper GI bleed 02/10/2018  . Alcohol use disorder, severe, in early remission ([place])  02/10/2018  . Chronic anemia   . Thrombocytopenia due to drugs   . Suicidal ideation   . Thrombocytopenia ([place]) 01/02/2018  . GERD (gastroesophageal reflux disease) 11/01/2017  . Trichimoniasis 10/30/2017  . Alcohol dependence ([place]) 10/29/2017  . Acute hyperactive alcohol withdrawal delirium ([place]) 10/09/2017  . Alcohol abuse with alcohol-induced mood disorder ([place])   . Alcohol withdrawal syndrome with complication ([place]) 03/47/4259  . Esophageal varices without bleeding ([place])   . Hematemesis 09/01/2017  . Ascites due to alcoholic cirrhosis ([place])   . Decompensated hepatic cirrhosis ([place]) 07/23/2017  . Alcohol abuse 07/23/2017  . Hepatitis C antibody positive in blood 07/23/2017  . Hypokalemia 07/23/2017  . Jaundice 07/23/2017  . Coagulopathy ([place]) 07/23/2017  . Hypomagnesemia 07/23/2017  . Pancytopenia ([place]) 07/23/2017  . Alcoholic cirrhosis of liver with ascites ([place]) 07/23/2017  . Bacterial vaginosis 06/03/2017  . UTI (urinary tract infection) 06/02/2017    Current Status: Since her last office visit, she has had a Hospital Admission at [facility] for Acute Pancreatitis. Today, she states that she is doing well with no complaints. She reports that she has recently relocated to [place], [place]. Her anxiety is stable today. She continues to follow up with Psychiatry and Dr. Montel Culver at [facility] as needed. She was recently discontinued from [facility] and is not taking Ativan 2 mg BID as needed. She denies suicidal ideations, homicidal ideations, or auditory hallucinations.    She continues to follow up with Dr. Marin Olp @ [facility] for Chronic Thrombocytopenia. Her most recent platelet count was at 44 on 10/18/2020. She denies bruises today. She continues to deny petechiae, gingival bleeding, epistaxis,  profuse bleeding from superficial cuts, melena, menorrhagia, mentrorrhagia, hematochezia, and hematuria. She states that her appetite is good. Denies  abdominal pain, nausea, vomiting, diarrhea, and constipation.     She denies fevers, chills, fatigue, recent infections, weight loss, and night sweats. She has not had any headaches, visual changes, dizziness, and falls. No chest pain, heart palpitations, cough and shortness of breath reported. Denies   No depression or anxiety, and She is taking all medications as prescribed. She denies pain today.   Assessment and Plan:  1. Hospital discharge follow-up  2. Alcohol-induced acute pancreatitis, unspecified complication status Stable today.   3. History of alcohol abuse Last Alcoholic beverage on 6/59/9357. She will continue to follow up with Psychiatry as needed.   4. Thrombocytopenia ([place]) Continue to follow up with Hematologist for NPLATE (Romoplostim) injections as needed.   5. Anxiety Stable today   6. Follow up She will follow up asap for office visit assessment.   No orders of the defined types were placed in this encounter.  No orders of the defined types were placed in this encounter.   Referral Orders  No referral(s) requested today    Briana Becton, MSN, ANE, FNP-BC [facility] [place] [address] [place], [place] [address] [phone]- fax    I discussed the assessment and treatment plan with the patient. The patient was provided an opportunity to ask questions and all were answered. The patient agreed with the plan and demonstrated an understanding of the instructions.   The patient was advised to call back or seek an in-person evaluation if the symptoms worsen or if the condition fails to improve as anticipated.  I provided 20 minutes of non-face-to-face time during this encounter.   [name], FNP

## 2020-10-25 ENCOUNTER — Inpatient Hospital Stay: Payer: Medicaid Other

## 2020-10-25 ENCOUNTER — Encounter: Payer: Self-pay | Admitting: Family Medicine

## 2020-10-25 ENCOUNTER — Other Ambulatory Visit: Payer: Self-pay

## 2020-10-25 ENCOUNTER — Ambulatory Visit (INDEPENDENT_AMBULATORY_CARE_PROVIDER_SITE_OTHER): Payer: Medicaid Other | Admitting: Family Medicine

## 2020-10-25 VITALS — BP 117/82 | HR 94 | Temp 97.8°F | Resp 16

## 2020-10-25 VITALS — BP 148/93 | HR 71 | Temp 98.6°F | Ht 59.0 in | Wt 133.0 lb

## 2020-10-25 DIAGNOSIS — K7031 Alcoholic cirrhosis of liver with ascites: Secondary | ICD-10-CM

## 2020-10-25 DIAGNOSIS — F419 Anxiety disorder, unspecified: Secondary | ICD-10-CM

## 2020-10-25 DIAGNOSIS — D696 Thrombocytopenia, unspecified: Secondary | ICD-10-CM

## 2020-10-25 DIAGNOSIS — R519 Headache, unspecified: Secondary | ICD-10-CM | POA: Diagnosis not present

## 2020-10-25 DIAGNOSIS — Z09 Encounter for follow-up examination after completed treatment for conditions other than malignant neoplasm: Secondary | ICD-10-CM

## 2020-10-25 DIAGNOSIS — D693 Immune thrombocytopenic purpura: Secondary | ICD-10-CM | POA: Diagnosis not present

## 2020-10-25 DIAGNOSIS — G8929 Other chronic pain: Secondary | ICD-10-CM | POA: Diagnosis not present

## 2020-10-25 DIAGNOSIS — M79673 Pain in unspecified foot: Secondary | ICD-10-CM

## 2020-10-25 LAB — CMP (CANCER CENTER ONLY)
ALT: 20 U/L (ref 0–44)
AST: 35 U/L (ref 15–41)
Albumin: 3.8 g/dL (ref 3.5–5.0)
Alkaline Phosphatase: 208 U/L — ABNORMAL HIGH (ref 38–126)
Anion gap: 6 (ref 5–15)
BUN: 10 mg/dL (ref 6–20)
CO2: 29 mmol/L (ref 22–32)
Calcium: 9.8 mg/dL (ref 8.9–10.3)
Chloride: 104 mmol/L (ref 98–111)
Creatinine: 0.63 mg/dL (ref 0.44–1.00)
GFR, Estimated: >60 mL/min (ref >60)
Glucose, Bld: 97 mg/dL (ref 70–99)
Potassium: 4.1 mmol/L (ref 3.5–5.1)
Sodium: 139 mmol/L (ref 135–145)
Total Bilirubin: 1.8 mg/dL — ABNORMAL HIGH (ref 0.3–1.2)
Total Protein: 7.3 g/dL (ref 6.5–8.1)

## 2020-10-25 LAB — CBC WITH DIFFERENTIAL (CANCER CENTER ONLY)
Abs Immature Granulocytes: 0 10*3/uL (ref 0.00–0.07)
Basophils Absolute: 0 10*3/uL (ref 0.0–0.1)
Basophils Relative: 0 %
Eosinophils Absolute: 0.2 10*3/uL (ref 0.0–0.5)
Eosinophils Relative: 6 %
HCT: 42 % (ref 36.0–46.0)
Hemoglobin: 13.8 g/dL (ref 12.0–15.0)
Immature Granulocytes: 0 %
Lymphocytes Relative: 19 %
Lymphs Abs: 0.5 10*3/uL — ABNORMAL LOW (ref 0.7–4.0)
MCH: 30.5 pg (ref 26.0–34.0)
MCHC: 32.9 g/dL (ref 30.0–36.0)
MCV: 92.7 fL (ref 80.0–100.0)
Monocytes Absolute: 0.2 10*3/uL (ref 0.1–1.0)
Monocytes Relative: 9 %
Neutro Abs: 1.5 10*3/uL — ABNORMAL LOW (ref 1.7–7.7)
Neutrophils Relative %: 66 %
Platelet Count: 26 10*3/uL — ABNORMAL LOW (ref 150–400)
RBC: 4.53 MIL/uL (ref 3.87–5.11)
RDW: 14.6 % (ref 11.5–15.5)
WBC Count: 2.3 10*3/uL — ABNORMAL LOW (ref 4.0–10.5)
nRBC: 0 % (ref 0.0–0.2)

## 2020-10-25 MED ORDER — ROMIPLOSTIM INJECTION 500 MCG
260.0000 ug | Freq: Once | SUBCUTANEOUS | Status: AC
  Administered 2020-10-25: 260 ug via SUBCUTANEOUS
  Filled 2020-10-25: qty 0.52

## 2020-10-25 MED ORDER — KETOROLAC TROMETHAMINE 60 MG/2ML IM SOLN
60.0000 mg | Freq: Once | INTRAMUSCULAR | Status: AC
  Administered 2020-10-25: 60 mg via INTRAMUSCULAR

## 2020-10-25 NOTE — Progress Notes (Signed)
Patient [facility]    Established Patient Office Visit  Subjective:  Patient ID: Bobette Leyh, female    DOB: 02-20-1971  Age: 50 y.o. MRN: [id number]  CC:  Chief Complaint  Patient presents with  . Follow-up    Follow up      HPI Lowella Tonda Wiederhold is a 50 year old female who presents for Follow Up today.   Patient Active Problem List   Diagnosis Date Noted  . Episodic mood disorder ([place]) 08/08/2020  . Left leg pain 03/28/2020  . History of recent fall 03/28/2020  . Abnormal urine 12/13/2019  . History of alcohol abuse 09/07/2019  . Insomnia 05/05/2019  . Dizziness 04/02/2019  . Bruising 04/02/2019  . Hyponatremia 04/02/2019  . Head injury   . Scalp laceration, initial encounter   . Fall 01/20/2019  . Epistaxis 01/20/2019  . Altered mental status   . 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 Other mixed anxiety disorders 10/27/2018  . Neuropathy 10/27/2018  . Abscess of bursa of left elbow 09/23/2018  . Olecranon bursitis of left elbow   . Erysipelas 09/13/2018  . Acute metabolic encephalopathy 37/90/2409  . Overdose 08/22/2018  . Hypotension 08/22/2018  . Seizure ([place]) 08/22/2018  . Substance induced mood disorder ([place]) 07/17/2018  . Cocaine dependence without complication ([place]) 73/53/2992  . Polysubstance abuse ([place]) 04/08/2018  . Encephalopathy, 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[place]) 04/08/2018  . Hepatitis C 03/18/2018  . Portal hypertension ([place]) 03/18/2018  . Alcoholic intoxication without complication ([place]) 42/68/3419  . Depression 03/18/2018  . Upper GI bleed 02/10/2018  . Alcohol use disorder, severe, in early remission ([place]) 02/10/2018  . Chronic anemia   . Thrombocytopenia due to drugs   . Suicidal ideation   . Thrombocytopenia ([place]) 01/02/2018  . GERD (gastroesophageal reflux disease) 11/01/2017  . Trichimoniasis 10/30/2017  . Alcohol dependence ([place]) 10/29/2017  . Acute hyperactive alcohol withdrawal delirium  ([place]) 10/09/2017  . Alcohol abuse with alcohol-induced mood disorder ([place])   . Alcohol withdrawal syndrome with complication ([place]) 62/22/9798  . Esophageal varices without bleeding ([place])   . Hematemesis 09/01/2017  . Ascites due to alcoholic cirrhosis ([place])   . Decompensated hepatic cirrhosis ([place]) 07/23/2017  . Alcohol abuse 07/23/2017  . Hepatitis C antibody positive in blood 07/23/2017  . Hypokalemia 07/23/2017  . Jaundice 07/23/2017  . Coagulopathy ([place]) 07/23/2017  . Hypomagnesemia 07/23/2017  . Pancytopenia ([place]) 07/23/2017  . Alcoholic cirrhosis of liver with ascites ([place]) 07/23/2017  . Bacterial vaginosis 06/03/2017  . UTI (urinary tract infection) 06/02/2017    Current Status: Since her last office visit, she is doing well with no complaints. She continues to follow up with Hematology for chronic Thrombocytopenia. Her most recent platelet count was at 22 on today (10/25/2020), which she received N-plate injection today. She has bruising has multiple bruises over her today. She continues to deny petechiae, gingival bleeding, epistaxis, profuse bleeding from superficial cuts, melena, menorrhagia, mentrorrhagia, hematochezia, and hematuria. She states that her appetite is good. Denies abdominal pain, nausea, vomiting, diarrhea, and constipation. She has no reports of blood in stools, dysuria and hematuria. She continues to struggle with decreasing her alcohol usage and attends AA meetings regularly. She continues to follow up with Psychiatry as needed. She denies suicidal ideations, homicidal ideations, or auditory hallucinations. She denies fevers, chills, fatigue, recent infections, weight loss, and night sweats. She has not had any headaches, visual changes, dizziness, and falls. No chest pain, heart palpitations, cough and shortness  of breath reported.  She is taking all medications as prescribed. She denies pain today.   Past Medical History:  Diagnosis Date  . Alcohol use   .  Anxiety   . Bipolar affective disorder ([place])    With anxiety features  . Bunionette of right foot 11/2019  . Cirrhosis of liver ([place])    Due to alcohol and hepatitis C  . Depression   . 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 ETOHism ([place])   . GERD (gastroesophageal reflux disease)   . Hematemesis 02/10/2018  . Hepatitis C 2018   hepatitis c and alcohol related hepatitis  . Heroin abuse ([place])   . History of blood transfusion    "blood doesn't clot; I fell down and had to have a transfusion"  . History of kidney stones   . History of thrombocytopenia   . Menopause 2016  . Migraine    "when I get really stressed" (09/01/2017)  . S/P foot surgery, right 11/2019  . Schizophrenia ([place])   . Seizures ([place])    "when I run out of my RX; lots recently" (09/01/2017)  . Thrombocytopenia ([place])   . Urinary urgency 05/2020    Past Surgical History:  Procedure Laterality Date  . ESOPHAGOGASTRODUODENOSCOPY N/A 09/03/2017   Procedure: ESOPHAGOGASTRODUODENOSCOPY (EGD);  Surgeon: Doran Stabler, MD;  Location: [facility];  Service: Gastroenterology;  Laterality: N/A;  . FINGER FRACTURE SURGERY Left    "shattered my pinky"  . FRACTURE SURGERY    . I & D EXTREMITY Left 09/18/2018   Procedure: IRRIGATION AND DEBRIDEMENT EXTREMITY;  Surgeon: Leanora Cover, MD;  Location: [facility];  Service: Orthopedics;  Laterality: Left;  . IR PARACENTESIS  07/23/2017  . IR PARACENTESIS  07/2017   "did it twice in the same week" (09/01/2017)  . SHOULDER OPEN ROTATOR CUFF REPAIR Right   . TOOTH EXTRACTION  08/2019  . TUBAL LIGATION    . VAGINAL HYSTERECTOMY      Family History  Problem Relation Age of Onset  . Lung cancer Mother 41  . Alcohol abuse Mother   . Throat cancer Father 52    Social History   Socioeconomic History  . Marital status: Divorced    Spouse name: Not on file  . Number of children: 2  . Years of education: Not on file  . Highest education level: Not on file  Occupational  History  . Occupation: applying for disability  Tobacco Use  . Smoking status: Never Smoker  . Smokeless tobacco: Never Used  Vaping Use  . Vaping Use: Never used  Substance and Sexual Activity  . Alcohol use: Not Currently    Alcohol/week: 6.0 standard drinks    Types: 6 Cans of beer per week    Comment: weekly  . Drug use: Not Currently  . Sexual activity: Not on file  Other Topics Concern  . Not on file  Social History Narrative   She moved with a boyfriend to [place] and was followed at [facility].  He died of a massive heart attack in 8/18, per her report, and so she moved back to [place] and is living with a friend.      Right handed   2 story home    Lives alone 11/09/19   Social Determinants of Health   Financial Resource Strain: Not on file  Food Insecurity: Not on file  Transportation Needs: Not on file  Physical Activity: Not on file  Stress: Not on file  Social Connections: Not on file  Intimate Partner Violence: Not on file    Outpatient Medications Prior to Visit  Medication Sig Dispense Refill  . ARIPiprazole (ABILIFY) 10 MG tablet TAKE 1 TABELT DAILY 30 tablet 5  . diclofenac Sodium (VOLTAREN) 1 % GEL Apply 4 g topically 4 (four) times daily. 100 g 2  . famotidine (PEPCID) 20 MG tablet Take 1 tablet (20 mg total) by mouth daily. 90 tablet 1  . FLUoxetine (PROZAC) 40 MG capsule Take 1 capsule (40 mg total) by mouth daily. 30 capsule 5  . furosemide (LASIX) 40 MG tablet Take 1 tablet (40 mg total) by mouth daily. 90 tablet 0  . gabapentin (NEURONTIN) 300 MG capsule TAKE 2 CAPSULES BY MOUTH THREE TIMES DAILY 90 capsule 0  . lactulose (CHRONULAC) 10 GM/15ML solution Take 30 mLs (20 g total) by mouth 3 (three) times daily. Goal to have 2-3 BMs daily 240 mL 3  . levETIRAcetam (KEPPRA) 500 MG tablet Take 1 tablet (500 mg total) by mouth 2 (two) times daily. 180 tablet 3  . LORazepam (ATIVAN) 2 MG tablet Take 1 tablet (2 mg total) by mouth 2 (two) times  daily as needed for anxiety. 60 tablet 2  . mirtazapine (REMERON) 30 MG tablet Take 1 tablet (30 mg total) by mouth at bedtime. 30 tablet 5  . Multiple Vitamin (MULTIVITAMIN WITH MINERALS) TABS tablet Take 1 tablet by mouth daily.    [name] Kitchen oxyCODONE (ROXICODONE) 5 MG immediate release tablet Take 1 tablet (5 mg total) by mouth every 6 (six) hours as needed for severe pain. 8 tablet 0  . polyethylene glycol (MIRALAX / GLYCOLAX) 17 g packet Take 17 g by mouth 2 (two) times daily. (Patient taking differently: Take 17 g by mouth daily as needed for mild constipation.) 14 each 0   No facility-administered medications prior to visit.    Allergies  Allergen Reactions  . Other Other (See Comments)    Platelets: Rx chest pain, tremors, body aches    ROS Review of Systems  Constitutional: Negative.   HENT: Negative.   Eyes: Negative.   Respiratory: Positive for shortness of breath (occasional ).   Cardiovascular: Negative.   Gastrointestinal: Negative.   Endocrine: Negative.   Genitourinary: Negative.   Musculoskeletal: Positive for arthralgias (generalized joint pain).  Skin: Negative.   Allergic/Immunologic: Negative.   Neurological: Positive for dizziness (occasional ), seizures (History) and headaches (occasional ).  Hematological: Negative.   Psychiatric/Behavioral: Negative.       Objective:    Physical Exam Vitals and nursing note reviewed.  Constitutional:      Appearance: Normal appearance.  HENT:     Head: Normocephalic and atraumatic.     Nose: Nose normal.     Mouth/Throat:     Mouth: Mucous membranes are moist.     Pharynx: Oropharynx is clear.  Cardiovascular:     Rate and Rhythm: Normal rate and regular rhythm.     Pulses: Normal pulses.     Heart sounds: Normal heart sounds.  Pulmonary:     Effort: Pulmonary effort is normal.     Breath sounds: Normal breath sounds.  Abdominal:     General: Bowel sounds are normal.     Palpations: Abdomen is soft.   Musculoskeletal:        General: Normal range of motion.     Cervical back: Normal range of motion and neck supple.  Skin:    General: Skin is warm and dry.  Findings: Bruising (scattered over entire body) present.  Neurological:     General: No focal deficit present.     Mental Status: She is alert and oriented to person, place, and time.  Psychiatric:        Mood and Affect: Mood normal.        Behavior: Behavior normal.        Thought Content: Thought content normal.        Judgment: Judgment normal.    BP (!) 148/93 (BP Location: Left Arm, Patient Position: Sitting, Cuff Size: Normal)   Pulse 71   Temp 98.6 F (37 C) (Temporal)   Ht 4\' 11"  (1.499 m)   Wt 133 lb (60.3 kg)   SpO2 99%   BMI 26.86 kg/m  Wt Readings from Last 3 Encounters:  10/25/20 133 lb (60.3 kg)  09/27/20 135 lb (61.2 kg)  09/06/20 142 lb (64.4 kg)     Health Maintenance Due  Topic Date Due  . TETANUS/TDAP  Never done  . COLONOSCOPY (Pts 45-64yrs Insurance coverage will need to be confirmed)  Never done  . COVID-19 Vaccine (3 - Booster for Pfizer series) 06/22/2020  . PAP SMEAR-Modifier  12/02/2020    There are no preventive care reminders to display for this patient.  Lab Results  Component Value Date   TSH 0.304 (L) 12/16/2019   Lab Results  Component Value Date   WBC 2.3 (L) 10/25/2020   HGB 13.8 10/25/2020   HCT 42.0 10/25/2020   MCV 92.7 10/25/2020   PLT 26 (L) 10/25/2020   Lab Results  Component Value Date   NA 139 10/25/2020   K 4.1 10/25/2020   CO2 29 10/25/2020   GLUCOSE 97 10/25/2020   BUN 10 10/25/2020   CREATININE 0.63 10/25/2020   BILITOT 1.8 (H) 10/25/2020   ALKPHOS 208 (H) 10/25/2020   AST 35 10/25/2020   ALT 20 10/25/2020   PROT 7.3 10/25/2020   ALBUMIN 3.8 10/25/2020   CALCIUM 9.8 10/25/2020   ANIONGAP 6 10/25/2020   Lab Results  Component Value Date   CHOL 213 (H) 12/13/2019   Lab Results  Component Value Date   HDL 108 12/13/2019   Lab Results   Component Value Date   LDLCALC 90 12/13/2019   Lab Results  Component Value Date   TRIG 85 12/13/2019   Lab Results  Component Value Date   CHOLHDL 2.0 12/13/2019   Lab Results  Component Value Date   HGBA1C 4.9 05/26/2020   HGBA1C 4.9 05/26/2020   HGBA1C 4.9 (A) 05/26/2020   HGBA1C 4.9 05/26/2020      Assessment & Plan:   1. Nonintractable headache, unspecified chronicity pattern, unspecified headache type - ketorolac (TORADOL) injection 60 mg  2. Chronic foot pain, unspecified laterality - ketorolac (TORADOL) injection 60 mg  3. Thrombocytopenia ([place]) Continue to follow up with Hematology   4. Anxiety  5. Follow up She will follow up in 3 months .  Meds ordered this encounter  Medications  . ketorolac (TORADOL) injection 60 mg    No orders of the defined types were placed in this encounter.  Kathe Becton, MSN, ANE, FNP-BC [facility] [place] [address] [place], [place] [address] [phone] fax  Problem List Items Addressed This Visit      Other   Thrombocytopenia ([place])    Other Visit Diagnoses    Nonintractable headache, unspecified chronicity pattern, unspecified headache type    -  Primary   Relevant Medications   ketorolac (TORADOL) injection 60 mg   Chronic foot pain, unspecified laterality       Relevant Medications   ketorolac (TORADOL) injection 60 mg   Anxiety       Follow up          Meds ordered this encounter  Medications  . ketorolac (TORADOL) injection 60 mg    Follow-up: No follow-ups on file.    [name], FNP

## 2020-10-26 ENCOUNTER — Encounter: Payer: Self-pay | Admitting: Family Medicine

## 2020-11-01 ENCOUNTER — Encounter: Payer: Self-pay | Admitting: Family

## 2020-11-01 ENCOUNTER — Inpatient Hospital Stay: Payer: Medicaid Other

## 2020-11-01 ENCOUNTER — Telehealth: Payer: Self-pay

## 2020-11-01 ENCOUNTER — Inpatient Hospital Stay (HOSPITAL_BASED_OUTPATIENT_CLINIC_OR_DEPARTMENT_OTHER): Payer: Medicaid Other | Admitting: Family

## 2020-11-01 ENCOUNTER — Other Ambulatory Visit: Payer: Self-pay

## 2020-11-01 VITALS — BP 141/90 | HR 100 | Temp 98.2°F | Resp 19 | Ht 59.0 in | Wt 140.0 lb

## 2020-11-01 DIAGNOSIS — K7031 Alcoholic cirrhosis of liver with ascites: Secondary | ICD-10-CM

## 2020-11-01 DIAGNOSIS — D696 Thrombocytopenia, unspecified: Secondary | ICD-10-CM

## 2020-11-01 DIAGNOSIS — D693 Immune thrombocytopenic purpura: Secondary | ICD-10-CM | POA: Diagnosis not present

## 2020-11-01 DIAGNOSIS — D61818 Other pancytopenia: Secondary | ICD-10-CM | POA: Diagnosis not present

## 2020-11-01 LAB — CBC WITH DIFFERENTIAL (CANCER CENTER ONLY)
Abs Immature Granulocytes: 0.01 10*3/uL (ref 0.00–0.07)
Basophils Absolute: 0 10*3/uL (ref 0.0–0.1)
Basophils Relative: 0 %
Eosinophils Absolute: 0.1 10*3/uL (ref 0.0–0.5)
Eosinophils Relative: 2 %
HCT: 41.9 % (ref 36.0–46.0)
Hemoglobin: 14 g/dL (ref 12.0–15.0)
Immature Granulocytes: 0 %
Lymphocytes Relative: 18 %
Lymphs Abs: 0.5 10*3/uL — ABNORMAL LOW (ref 0.7–4.0)
MCH: 30.7 pg (ref 26.0–34.0)
MCHC: 33.4 g/dL (ref 30.0–36.0)
MCV: 91.9 fL (ref 80.0–100.0)
Monocytes Absolute: 0.4 10*3/uL (ref 0.1–1.0)
Monocytes Relative: 13 %
Neutro Abs: 1.8 10*3/uL (ref 1.7–7.7)
Neutrophils Relative %: 67 %
Platelet Count: 80 10*3/uL — ABNORMAL LOW (ref 150–400)
RBC: 4.56 MIL/uL (ref 3.87–5.11)
RDW: 15.5 % (ref 11.5–15.5)
WBC Count: 2.7 10*3/uL — ABNORMAL LOW (ref 4.0–10.5)
nRBC: 0 % (ref 0.0–0.2)

## 2020-11-01 LAB — CMP (CANCER CENTER ONLY)
ALT: 17 U/L (ref 0–44)
AST: 30 U/L (ref 15–41)
Albumin: 4.2 g/dL (ref 3.5–5.0)
Alkaline Phosphatase: 187 U/L — ABNORMAL HIGH (ref 38–126)
Anion gap: 8 (ref 5–15)
BUN: 9 mg/dL (ref 6–20)
CO2: 27 mmol/L (ref 22–32)
Calcium: 9.7 mg/dL (ref 8.9–10.3)
Chloride: 103 mmol/L (ref 98–111)
Creatinine: 0.61 mg/dL (ref 0.44–1.00)
GFR, Estimated: >60 mL/min (ref >60)
Glucose, Bld: 119 mg/dL — ABNORMAL HIGH (ref 70–99)
Potassium: 3.5 mmol/L (ref 3.5–5.1)
Sodium: 138 mmol/L (ref 135–145)
Total Bilirubin: 1.3 mg/dL — ABNORMAL HIGH (ref 0.3–1.2)
Total Protein: 7.7 g/dL (ref 6.5–8.1)

## 2020-11-01 MED ORDER — ROMIPLOSTIM INJECTION 500 MCG
260.0000 ug | Freq: Once | SUBCUTANEOUS | Status: AC
  Administered 2020-11-01: 260 ug via SUBCUTANEOUS
  Filled 2020-11-01: qty 0.52

## 2020-11-01 NOTE — Progress Notes (Signed)
Hematology and Oncology Follow Up Visit  Briana Sweeney [id number] June 25, 1971 50 y.o. 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 She is doing well and has no complaints at this time.  She has not noted any blood loss. No bruising or petechiae.  Platelets are stable at 80, Hgb 14 and WBC count 2.7.  No fever, chills, n/v, cough, rash, dizziness, SOB, chest pain, palpitations, abdominal pain or changes in bowel or bladder habits.  No swelling, tenderness, numbness or tingling in her extremities at this time.  She denies falls or syncope.  She has maintained a good appetite but admits that she needs to better hydrate. Her weight is stable at 140 lbs.   ECOG Performance Status: 1 - Symptomatic but completely ambulatory  Medications:  Allergies as of 11/01/2020      Reactions   Other Other (See Comments)   Platelets: Rx chest pain, tremors, body aches      Medication List       Accurate as of November 01, 2020 11:58 AM. If you have any questions, ask your nurse or doctor.        ARIPiprazole 10 MG tablet Commonly known as: ABILIFY TAKE 1 TABELT DAILY   diclofenac Sodium 1 % Gel Commonly known as: VOLTAREN Apply 4 g topically 4 (four) times daily.   famotidine 20 MG tablet Commonly known as: PEPCID Take 1 tablet (20 mg total) by mouth daily.   FLUoxetine 40 MG capsule Commonly known as: PROZAC Take 1 capsule (40 mg total) by mouth daily.   furosemide 40 MG tablet Commonly known as: Lasix Take 1 tablet (40 mg total) by mouth daily.   gabapentin 300 MG capsule Commonly known as: NEURONTIN TAKE 2 CAPSULES BY MOUTH THREE TIMES DAILY   lactulose 10 GM/15ML solution Commonly known as: CHRONULAC Take 30 mLs (20 g total) by mouth 3 (three) times daily. Goal to have 2-3 BMs  daily   levETIRAcetam 500 MG tablet Commonly known as: Keppra Take 1 tablet (500 mg total) by mouth 2 (two) times daily.   LORazepam 2 MG tablet Commonly known as: ATIVAN Take 1 tablet (2 mg total) by mouth 2 (two) times daily as needed for anxiety.   mirtazapine 30 MG tablet Commonly known as: REMERON Take 1 tablet (30 mg total) by mouth at bedtime.   multivitamin with minerals Tabs tablet Take 1 tablet by mouth daily.   oxyCODONE 5 MG immediate release tablet Commonly known as: Roxicodone Take 1 tablet (5 mg total) by mouth every 6 (six) hours as needed for severe pain.   polyethylene glycol 17 g packet Commonly known as: MIRALAX / GLYCOLAX Take 17 g by mouth 2 (two) times daily. What changed:   when to take this  reasons to take this       Allergies:  Allergies  Allergen Reactions  . Other Other (See Comments)    Platelets: Rx chest pain, tremors, body aches    Past Medical History, Surgical history, Social history, and Family History were reviewed and updated.  Review of Systems: All other 10 point review of systems is negative.   Physical Exam:  height is 4\' 11"  (1.499 m) and weight is 140 lb (63.5 kg). Her oral temperature is 98.2 F (36.8 C). Her blood pressure is 141/90 (abnormal) and her pulse is 100. Her  respiration is 19 and oxygen saturation is 98%.   Wt Readings from Last 3 Encounters:  11/01/20 140 lb (63.5 kg)  10/25/20 133 lb (60.3 kg)  09/27/20 135 lb (61.2 kg)    Ocular: Sclerae unicteric, pupils equal, round and reactive to light Ear-nose-throat: Oropharynx clear, dentition fair Lymphatic: No cervical or supraclavicular adenopathy Lungs no rales or rhonchi, good excursion bilaterally Heart regular rate and rhythm, no murmur appreciated Abd soft, nontender, positive bowel sounds MSK no focal spinal tenderness, no joint edema Neuro: non-focal, well-oriented, appropriate affect Breasts: Deferred   Lab Results  Component Value Date    WBC 2.7 (L) 11/01/2020   HGB 14.0 11/01/2020   HCT 41.9 11/01/2020   MCV 91.9 11/01/2020   PLT 80 (L) 11/01/2020   No results found for: FERRITIN, IRON, TIBC, UIBC, IRONPCTSAT Lab Results  Component Value Date   RBC 4.56 11/01/2020   No results found for: KPAFRELGTCHN, LAMBDASER, KAPLAMBRATIO No results found for: IGGSERUM, IGA, IGMSERUM No results found for: Odetta Pink, SPEI   Chemistry      Component Value Date/Time   NA 138 11/01/2020 1116   NA 142 01/04/2020 1510   K 3.5 11/01/2020 1116   CL 103 11/01/2020 1116   CO2 27 11/01/2020 1116   BUN 9 11/01/2020 1116   BUN 6 01/04/2020 1510   CREATININE 0.61 11/01/2020 1116   CREATININE 0.54 05/01/2020 1043      Component Value Date/Time   CALCIUM 9.7 11/01/2020 1116   ALKPHOS 187 (H) 11/01/2020 1116   AST 30 11/01/2020 1116   ALT 17 11/01/2020 1116   BILITOT 1.3 (H) 11/01/2020 1116       Impression and Plan: Briana Sweeney is a very pleasant 50 yo caucasian female with 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 Nplate given today.  She will continue her weekly lab check and Nplate injection with follow-up in 8 weeks.  She can contact our office with any questions or concerns.  Laverna Peace, NP 2/23/202211:58 AM

## 2020-11-01 NOTE — Patient Instructions (Signed)
Romiplostim injection What is this medicine? ROMIPLOSTIM (roe mi PLOE stim) helps your body make more platelets. This medicine is used to treat low platelets caused by chronic idiopathic thrombocytopenic purpura (ITP) or a bone marrow syndrome caused by radiation sickness. This medicine may be used for other purposes; ask your health care provider or pharmacist if you have questions. COMMON BRAND NAME(S): Nplate What should I tell my health care provider before I take this medicine? They need to know if you have any of these conditions:  blood clots  myelodysplastic syndrome  an unusual or allergic reaction to romiplostim, mannitol, other medicines, foods, dyes, or preservatives  pregnant or trying to get pregnant  breast-feeding How should I use this medicine? This medicine is injected under the skin. It is given by a health care provider in a hospital or clinic setting. A special MedGuide will be given to you before each treatment. Be sure to read this information carefully each time. Talk to your health care provider about the use of this medicine in children. While it may be prescribed for children as young as newborns for selected conditions, precautions do apply. Overdosage: If you think you have taken too much of this medicine contact a poison control center or emergency room at once. NOTE: This medicine is only for you. Do not share this medicine with others. What if I miss a dose? Keep appointments for follow-up doses. It is important not to miss your dose. Call your health care provider if you are unable to keep an appointment. What may interact with this medicine? Interactions are not expected. This list may not describe all possible interactions. Give your health care provider a list of all the medicines, herbs, non-prescription drugs, or dietary supplements you use. Also tell them if you smoke, drink alcohol, or use illegal drugs. Some items may interact with your  medicine. What should I watch for while using this medicine? Visit your health care provider for regular checks on your progress. You may need blood work done while you are taking this medicine. Your condition will be monitored carefully while you are receiving this medicine. It is important not to miss any appointments. What side effects may I notice from receiving this medicine? Side effects that you should report to your doctor or health care professional as soon as possible:  allergic reactions (skin rash, itching or hives; swelling of the face, lips, or tongue)  bleeding (bloody or black, tarry stools; red or dark brown urine; spitting up blood or brown material that looks like coffee grounds; red spots on the skin; unusual bruising or bleeding from the eyes, gums, or nose)  blood clot (chest pain; shortness of breath; pain, swelling, or warmth in the leg)  stroke (changes in vision; confusion; trouble speaking or understanding; severe headaches; sudden numbness or weakness of the face, arm or leg; trouble walking; dizziness; loss of balance or coordination) Side effects that usually do not require medical attention (report to your doctor or health care professional if they continue or are bothersome):  diarrhea  dizziness  headache  joint pain  muscle pain  stomach pain  trouble sleeping This list may not describe all possible side effects. Call your doctor for medical advice about side effects. You may report side effects to FDA at 1-800-FDA-1088. Where should I keep my medicine? This medicine is given in a hospital or clinic. It will not be stored at home. NOTE: This sheet is a summary. It may not cover all possible   information. If you have questions about this medicine, talk to your doctor, pharmacist, or health care provider.  2021 Elsevier/Gold Standard (2019-10-11 10:28:13)  

## 2020-11-01 NOTE — Telephone Encounter (Signed)
Called and left a message with all appts per 11/01/20 [place]   [place]

## 2020-11-06 MED FILL — FLUoxetine HCL 40 MG CAPS: 40 | 30 days supply | Qty: 30 | Fill #2

## 2020-11-06 MED FILL — ARIPIPRAZOLE 10 MG TABS: 10 | 30 days supply | Qty: 30 | Fill #2

## 2020-11-08 ENCOUNTER — Other Ambulatory Visit: Payer: Self-pay

## 2020-11-08 ENCOUNTER — Inpatient Hospital Stay: Payer: Medicaid Other | Attending: Hematology & Oncology

## 2020-11-08 ENCOUNTER — Telehealth (INDEPENDENT_AMBULATORY_CARE_PROVIDER_SITE_OTHER): Payer: Medicaid Other | Admitting: Psychiatry

## 2020-11-08 ENCOUNTER — Other Ambulatory Visit (HOSPITAL_COMMUNITY): Payer: Self-pay | Admitting: Psychiatry

## 2020-11-08 ENCOUNTER — Inpatient Hospital Stay: Payer: Medicaid Other

## 2020-11-08 DIAGNOSIS — F1021 Alcohol dependence, in remission: Secondary | ICD-10-CM | POA: Diagnosis not present

## 2020-11-08 DIAGNOSIS — F39 Unspecified mood [affective] disorder: Secondary | ICD-10-CM | POA: Diagnosis not present

## 2020-11-08 DIAGNOSIS — F413 Other mixed anxiety disorders: Secondary | ICD-10-CM | POA: Diagnosis not present

## 2020-11-08 DIAGNOSIS — F419 Anxiety disorder, unspecified: Secondary | ICD-10-CM

## 2020-11-08 DIAGNOSIS — D693 Immune thrombocytopenic purpura: Secondary | ICD-10-CM | POA: Insufficient documentation

## 2020-11-08 MED ORDER — MIRTAZAPINE 30 MG PO TABS: 30.0000 mg | ORAL_TABLET | Freq: Every day | ORAL | 5 refills | Status: DC

## 2020-11-08 MED ORDER — CLONAZEPAM 2 MG PO TABS: 2.0000 mg | ORAL_TABLET | Freq: Two times a day (BID) | ORAL | 2 refills | Status: DC | PRN

## 2020-11-08 MED ORDER — ARIPIPRAZOLE 10 MG PO TABS: ORAL_TABLET | ORAL | 5 refills | Status: DC

## 2020-11-08 MED ORDER — FLUOXETINE HCL 40 MG PO CAPS: 40.0000 mg | ORAL_CAPSULE | Freq: Every day | ORAL | 5 refills | Status: DC

## 2020-11-08 MED FILL — MIRTAZAPINE 30 MG TABLET: 30 | 30 days supply | Qty: 30 | Fill #0

## 2020-11-08 MED FILL — clonazePAM 2 MG TABS: 2 | 30 days supply | Qty: 60 | Fill #0

## 2020-11-08 NOTE — Progress Notes (Signed)
[facility] MD/PA/NP OP Progress Note  11/08/2020 10:11 AM Briana Sweeney  MRN:  [id number] Interview was conducted by phone and I verified that I was speaking with the correct person using two identifiers. I discussed the limitations of evaluation and management by telemedicine and  the availability of in person appointments. Patient expressed understanding and agreed to proceed. Participants in the visit: patient (location - home); physician (location - home office).  Chief Complaint: Anxiety.  HPI: 50 yo divorced female with a long hx of alcohol use disorder, polysubstance abuse (cocaine, heroin), and past diagnoses of major depressive disorder vs substance-induced mood disorder vs bipolar disorder. Diagnosis of schizophrenia is also mentioned in the record but patient is unable to provide any clear history of psychotic episodes unrelated to her drug/alcohol use. She was first treated for mental problems at age 14 when she became depressed and OD on meds. She has been taking fluoxetine (up to 60 mg) for over 20 years per her report. She also tried buspirone, lorazepamand hydroxyzine for anxiety with no clear benefit. Recently prescribed trazodone for insomnia does not appear to work either. She has never been on any mood stabilizers except for lithium or antipsychotics apart from haloperidol. She did not like how she felt on either of those. She started abusing alcohol at age of 23. At one time she was drinking a 1/2 gallon of vodka plus some beer on a daily basis. She has a hx of alcohol withdrawal delirium. She has some periods of sobriety lasting few months at a time but cannot provide more details. She has alcohol liver cirrhosis, thrombocytopenia andhepatitis C.She minimizes use of illicit drugs in the pastclaiming that she only usedthem when "partying".We have added mirtazapine and she reports that sleep has improved. She continues to complain primarily of severe anxiety both generalized and panic  type. She has no support, lives alone, struggles financially. Briana Sweeney reports that she has not been drinking alcohollast nine months.She has not been taking buspironeor hydroxyzine as they were ineffective for anxiety. She was then started onclonazepam for panic attacks which she found helpful for anxiety. She lost granddaughter in September and clonazepam was changed to lorazepam - she initially thought it was more effective but now has changed her mind and would like to return to taking clonazepam.     Visit Diagnosis:    ICD-10-CM   1. Other mixed anxiety disorders  F41.3   2. Anxiety  F41.9 FLUoxetine (PROZAC) 40 MG capsule  3. Alcohol use disorder, severe, in early remission ([place])  F10.21   4. Episodic mood disorder ([place])  F39     Past Psychiatric History: Please see intake H&P.  Past Medical History:  Past Medical History:  Diagnosis Date  . Alcohol use   . Anxiety   . Bipolar affective disorder ([place])    With anxiety features  . [place] of right foot 11/2019  . Cirrhosis of liver ([place])    Due to alcohol and hepatitis C  . Depression   . 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 ETOHism ([place])   . GERD (gastroesophageal reflux disease)   . Hematemesis 02/10/2018  . Hepatitis C 2018   hepatitis c and alcohol related hepatitis  . Heroin abuse ([place])   . History of blood transfusion    "blood doesn't clot; I fell down and had to have a transfusion"  . History of kidney stones   . History of thrombocytopenia   . Menopause 2016  . Migraine    "  when I get really stressed" (09/01/2017)  . S/P foot surgery, right 11/2019  . Schizophrenia ([place])   . Seizures ([place])    "when I run out of my RX; lots recently" (09/01/2017)  . Thrombocytopenia ([place])   . Urinary urgency 05/2020    Past Surgical History:  Procedure Laterality Date  . ESOPHAGOGASTRODUODENOSCOPY N/A 09/03/2017   Procedure: ESOPHAGOGASTRODUODENOSCOPY (EGD);  Surgeon: Doran Stabler, MD;  Location: [facility];  Service: Gastroenterology;  Laterality: N/A;  . FINGER FRACTURE SURGERY Left    "shattered my pinky"  . FRACTURE SURGERY    . I & D EXTREMITY Left 09/18/2018   Procedure: IRRIGATION AND DEBRIDEMENT EXTREMITY;  Surgeon: Leanora Cover, MD;  Location: [facility];  Service: Orthopedics;  Laterality: Left;  . IR PARACENTESIS  07/23/2017  . IR PARACENTESIS  07/2017   "did it twice in the same week" (09/01/2017)  . SHOULDER OPEN ROTATOR CUFF REPAIR Right   . TOOTH EXTRACTION  08/2019  . TUBAL LIGATION    . VAGINAL HYSTERECTOMY      Family Psychiatric History: Reviewed.  Family History:  Family History  Problem Relation Age of Onset  . Lung cancer Mother 43  . Alcohol abuse Mother   . Throat cancer Father 58    Social History:  Social History   Socioeconomic History  . Marital status: Divorced    Spouse name: Not on file  . Number of children: 2  . Years of education: Not on file  . Highest education level: Not on file  Occupational History  . Occupation: applying for disability  Tobacco Use  . Smoking status: Never Smoker  . Smokeless tobacco: Never Used  Vaping Use  . Vaping Use: Never used  Substance and Sexual Activity  . Alcohol use: Not Currently    Alcohol/week: 6.0 standard drinks    Types: 6 Cans of beer per week    Comment: weekly  . Drug use: Not Currently  . Sexual activity: Not on file  Other Topics Concern  . Not on file  Social History Narrative   She moved with a boyfriend to [place] and was followed at [facility].  He died of a massive heart attack in 8/18, per her report, and so she moved back to [place] and is living with a friend.      Right handed   2 story home    Lives alone 11/09/19   Social Determinants of Health   Financial Resource Strain: Not on file  Food Insecurity: Not on file  Transportation Needs: Not on file  Physical Activity: Not on file  Stress: Not on file  Social Connections: Not on file    Allergies:   Allergies  Allergen Reactions  . Other Other (See Comments)    Platelets: Rx chest pain, tremors, body aches    Metabolic Disorder Labs: Lab Results  Component Value Date   HGBA1C 4.9 05/26/2020   HGBA1C 4.9 05/26/2020   HGBA1C 4.9 (A) 05/26/2020   HGBA1C 4.9 05/26/2020   MPG 100 12/15/2019   No results found for: PROLACTIN Lab Results  Component Value Date   CHOL 213 (H) 12/13/2019   TRIG 85 12/13/2019   HDL 108 12/13/2019   CHOLHDL 2.0 12/13/2019   LDLCALC 90 12/13/2019   LDLCALC 89 05/05/2019   Lab Results  Component Value Date   TSH 0.304 (L) 12/16/2019   TSH 0.618 12/13/2019    Therapeutic Level Labs: No results found for: LITHIUM No results found  for: VALPROATE No components found for:  CBMZ  Current Medications: Current Outpatient Medications  Medication Sig Dispense Refill  . [START ON 11/13/2020] clonazePAM (KLONOPIN) 2 MG tablet Take 1 tablet (2 mg total) by mouth 2 (two) times daily as needed for anxiety. 60 tablet 2  . ARIPiprazole (ABILIFY) 10 MG tablet TAKE 1 TABELT DAILY 30 tablet 5  . diclofenac Sodium (VOLTAREN) 1 % GEL Apply 4 g topically 4 (four) times daily. 100 g 2  . famotidine (PEPCID) 20 MG tablet Take 1 tablet (20 mg total) by mouth daily. 90 tablet 1  . FLUoxetine (PROZAC) 40 MG capsule Take 1 capsule (40 mg total) by mouth daily. 30 capsule 5  . furosemide (LASIX) 40 MG tablet Take 1 tablet (40 mg total) by mouth daily. 90 tablet 0  . gabapentin (NEURONTIN) 300 MG capsule TAKE 2 CAPSULES BY MOUTH THREE TIMES DAILY 90 capsule 0  . lactulose (CHRONULAC) 10 GM/15ML solution Take 30 mLs (20 g total) by mouth 3 (three) times daily. Goal to have 2-3 BMs daily 240 mL 3  . levETIRAcetam (KEPPRA) 500 MG tablet Take 1 tablet (500 mg total) by mouth 2 (two) times daily. 180 tablet 3  . mirtazapine (REMERON) 30 MG tablet Take 1 tablet (30 mg total) by mouth at bedtime. 30 tablet 5  . Multiple Vitamin (MULTIVITAMIN WITH MINERALS) TABS tablet Take 1  tablet by mouth daily.    [name] Kitchen oxyCODONE (ROXICODONE) 5 MG immediate release tablet Take 1 tablet (5 mg total) by mouth every 6 (six) hours as needed for severe pain. 8 tablet 0  . polyethylene glycol (MIRALAX / GLYCOLAX) 17 g packet Take 17 g by mouth 2 (two) times daily. (Patient taking differently: Take 17 g by mouth daily as needed for mild constipation.) 14 each 0   No current facility-administered medications for this visit.     Psychiatric Specialty Exam: Review of Systems  Psychiatric/Behavioral: The patient is nervous/anxious.   All other systems reviewed and are negative.   There were no vitals taken for this visit.There is no height or weight on file to calculate BMI.  General Appearance: NA  Eye Contact:  NA  Speech:  Clear and Coherent and Normal Rate  Volume:  Normal  Mood:  Anxious  Affect:  NA  Thought Process:  Goal Directed and Linear  Orientation:  Full (Time, Place, and Person)  Thought Content: Logical   Suicidal Thoughts:  No  Homicidal Thoughts:  No  Memory:  Immediate;   Good Recent;   Good Remote;   Good  Judgement:  Fair  Insight:  Fair  Psychomotor Activity:  NA  Concentration:  Concentration: Good  Recall:  Good  Fund of Knowledge: Good  Language: Good  Akathisia:  Negative  Handed:  Right  AIMS (if indicated): not done  Assets:  Communication Skills Desire for Improvement Housing Resilience  ADL's:  Intact  Cognition: WNL  Sleep:  Good   Screenings: AIMS   Flowsheet Row Admission (Discharged) from 08/25/2018 in [facility] Admission (Discharged) from 10/28/2017 in [facility]  AIMS Total Score 0 0    AUDIT   Flowsheet Row Admission (Discharged) from 08/25/2018 in [facility] Admission (Discharged) from 10/28/2017 in [facility]  Alcohol Use Disorder Identification Test Final Score (AUDIT) 40 32    PHQ2-9    [facility] Office Visit from 05/26/2020 in [facility]  Office Visit from 05/05/2019 in [facility] Office Visit from 04/22/2019 in [facility] Office Visit from 02/19/2019 in [facility] Office Visit from 09/29/2018 in [facility]  PHQ-2 Total Score 0 0 0 0 0    [facility] ED from 09/27/2020 in [facility] to Hosp-Admission (Discharged) from 08/22/2018 in [facility] CATEGORY No Risk Low Risk       Assessment and Plan: 50 yo divorced female with a long hx of alcohol use disorder, polysubstance abuse (cocaine, heroin), and past diagnoses of major depressive disorder vs substance-induced mood disorder vs bipolar disorder. Diagnosis of schizophrenia is also mentioned in the record but patient is unable to provide any clear history of psychotic episodes unrelated to her drug/alcohol use. She was first treated for mental problems at age 5 when she became depressed and OD on meds. She has been taking fluoxetine (up to 60 mg) for over 20 years per her report. She also tried buspirone, lorazepamand hydroxyzine for anxiety with no clear benefit. Recently prescribed trazodone for insomnia does not appear to work either. She has never been on any mood stabilizers except for lithium or antipsychotics apart from haloperidol. She did not like how she felt on either of those. She started abusing alcohol at age of 16. At one time she was drinking a 1/2 gallon of vodka plus some beer on a daily basis. She has a hx of alcohol withdrawal delirium. She has some periods of sobriety lasting few months at a time but cannot provide more details. She has alcohol liver cirrhosis, thrombocytopenia andhepatitis C.She minimizes use of illicit drugs in the pastclaiming that she only usedthem when "partying".We have added  mirtazapine and she reports that sleep has improved. She continues to complain primarily of severe anxiety both generalized and panic type. She has no support, lives alone, struggles financially. Briana Sweeney reports that she has not been drinking alcohollast nine months.She has not been taking buspironeor hydroxyzine as they were ineffective for anxiety. She was then started onclonazepam for panic attacks which she found helpful for anxiety. She lost granddaughter in September and clonazepam was changed to lorazepam - she initially thought it was more effective but now has changed her mind and would like to return to taking clonazepam.    JG:GEZMO anxiety disorder;Mood disorder unspecified (Drug-induced vs bipolar disorder unspecified); Alcohol use disorder severe in early remission  Plan: We will continue fluoxetine 40 mg daily, Abilifyto 10 mg,Remeron 30 mg andlorazepam 2 mg prn anxiety (bid now).Next appointment in3 months with a new provider. The plan was discussed with patient who had an opportunity to ask questions and these were all answered. I spend59minutes in phone consultationwith the patient.    Stephanie Acre, MD 11/08/2020, 10:11 AM

## 2020-11-09 ENCOUNTER — Telehealth: Payer: Self-pay

## 2020-11-09 ENCOUNTER — Ambulatory Visit: Payer: Medicaid Other

## 2020-11-09 ENCOUNTER — Inpatient Hospital Stay: Payer: Medicaid Other
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
## 2020-11-09 NOTE — Telephone Encounter (Signed)
Called and left a message that todays appts will have to be earlier in the day or r/s to tomorrow due to "mixing" times for injections noted in secure chat     Briana Sweeney

## 2020-11-13 MED FILL — ARIPIPRAZOLE 10 MG TABS: 10 | 30 days supply | Qty: 30 | Fill #3

## 2020-11-15 ENCOUNTER — Inpatient Hospital Stay: Payer: Medicaid Other

## 2020-11-15 ENCOUNTER — Other Ambulatory Visit: Payer: Self-pay

## 2020-11-15 VITALS — BP 100/72 | HR 62 | Temp 98.4°F | Resp 18

## 2020-11-15 DIAGNOSIS — K7031 Alcoholic cirrhosis of liver with ascites: Secondary | ICD-10-CM

## 2020-11-15 DIAGNOSIS — D696 Thrombocytopenia, unspecified: Secondary | ICD-10-CM

## 2020-11-15 DIAGNOSIS — D61818 Other pancytopenia: Secondary | ICD-10-CM

## 2020-11-15 DIAGNOSIS — D693 Immune thrombocytopenic purpura: Secondary | ICD-10-CM | POA: Diagnosis present

## 2020-11-15 LAB — CBC WITH DIFFERENTIAL (CANCER CENTER ONLY)
Abs Immature Granulocytes: 0 10*3/uL (ref 0.00–0.07)
Band Neutrophils: 0 %
Basophils Absolute: 0 10*3/uL (ref 0.0–0.1)
Basophils Relative: 0 %
Blasts: 0 %
Eosinophils Absolute: 0.1 10*3/uL (ref 0.0–0.5)
Eosinophils Relative: 4 %
HCT: 43.6 % (ref 36.0–46.0)
Hemoglobin: 14.3 g/dL (ref 12.0–15.0)
Lymphocytes Relative: 16 %
Lymphs Abs: 0.4 10*3/uL — ABNORMAL LOW (ref 0.7–4.0)
MCH: 30.4 pg (ref 26.0–34.0)
MCHC: 32.8 g/dL (ref 30.0–36.0)
MCV: 92.8 fL (ref 80.0–100.0)
Metamyelocytes Relative: 0 %
Monocytes Absolute: 0.3 10*3/uL (ref 0.1–1.0)
Monocytes Relative: 13 %
Myelocytes: 0 %
Neutro Abs: 1.8 10*3/uL (ref 1.7–7.7)
Neutrophils Relative %: 67 %
Other: 0 %
Platelet Count: 121 10*3/uL — ABNORMAL LOW (ref 150–400)
Promyelocytes Relative: 0 %
RBC: 4.7 MIL/uL (ref 3.87–5.11)
RDW: 15.3 % (ref 11.5–15.5)
WBC Count: 2.6 10*3/uL — ABNORMAL LOW (ref 4.0–10.5)
nRBC: 0 % (ref 0.0–0.2)
nRBC: 0 /100 WBC

## 2020-11-15 LAB — CMP (CANCER CENTER ONLY)
ALT: 17 U/L (ref 0–44)
AST: 30 U/L (ref 15–41)
Albumin: 4 g/dL (ref 3.5–5.0)
Alkaline Phosphatase: 183 U/L — ABNORMAL HIGH (ref 38–126)
Anion gap: 4 — ABNORMAL LOW (ref 5–15)
BUN: 6 mg/dL (ref 6–20)
CO2: 33 mmol/L — ABNORMAL HIGH (ref 22–32)
Calcium: 9.8 mg/dL (ref 8.9–10.3)
Chloride: 102 mmol/L (ref 98–111)
Creatinine: 0.64 mg/dL (ref 0.44–1.00)
GFR, Estimated: >60 mL/min (ref >60)
Glucose, Bld: 93 mg/dL (ref 70–99)
Potassium: 3.7 mmol/L (ref 3.5–5.1)
Sodium: 139 mmol/L (ref 135–145)
Total Bilirubin: 1.6 mg/dL — ABNORMAL HIGH (ref 0.3–1.2)
Total Protein: 7.3 g/dL (ref 6.5–8.1)

## 2020-11-15 MED ORDER — ROMIPLOSTIM INJECTION 500 MCG
260.0000 ug | Freq: Once | SUBCUTANEOUS | Status: AC
  Administered 2020-11-15: 260 ug via SUBCUTANEOUS
  Filled 2020-11-15: qty 0.52

## 2020-11-15 NOTE — Patient Instructions (Signed)
Romiplostim injection What is this medicine? ROMIPLOSTIM (roe mi PLOE stim) helps your body make more platelets. This medicine is used to treat low platelets caused by chronic idiopathic thrombocytopenic purpura (ITP) or a bone marrow syndrome caused by radiation sickness. This medicine may be used for other purposes; ask your health care provider or pharmacist if you have questions. COMMON BRAND NAME(S): Nplate What should I tell my health care provider before I take this medicine? They need to know if you have any of these conditions:  blood clots  myelodysplastic syndrome  an unusual or allergic reaction to romiplostim, mannitol, other medicines, foods, dyes, or preservatives  pregnant or trying to get pregnant  breast-feeding How should I use this medicine? This medicine is injected under the skin. It is given by a health care provider in a hospital or clinic setting. A special MedGuide will be given to you before each treatment. Be sure to read this information carefully each time. Talk to your health care provider about the use of this medicine in children. While it may be prescribed for children as young as newborns for selected conditions, precautions do apply. Overdosage: If you think you have taken too much of this medicine contact a poison control center or emergency room at once. NOTE: This medicine is only for you. Do not share this medicine with others. What if I miss a dose? Keep appointments for follow-up doses. It is important not to miss your dose. Call your health care provider if you are unable to keep an appointment. What may interact with this medicine? Interactions are not expected. This list may not describe all possible interactions. Give your health care provider a list of all the medicines, herbs, non-prescription drugs, or dietary supplements you use. Also tell them if you smoke, drink alcohol, or use illegal drugs. Some items may interact with your  medicine. What should I watch for while using this medicine? Visit your health care provider for regular checks on your progress. You may need blood work done while you are taking this medicine. Your condition will be monitored carefully while you are receiving this medicine. It is important not to miss any appointments. What side effects may I notice from receiving this medicine? Side effects that you should report to your doctor or health care professional as soon as possible:  allergic reactions (skin rash, itching or hives; swelling of the face, lips, or tongue)  bleeding (bloody or black, tarry stools; red or dark brown urine; spitting up blood or brown material that looks like coffee grounds; red spots on the skin; unusual bruising or bleeding from the eyes, gums, or nose)  blood clot (chest pain; shortness of breath; pain, swelling, or warmth in the leg)  stroke (changes in vision; confusion; trouble speaking or understanding; severe headaches; sudden numbness or weakness of the face, arm or leg; trouble walking; dizziness; loss of balance or coordination) Side effects that usually do not require medical attention (report to your doctor or health care professional if they continue or are bothersome):  diarrhea  dizziness  headache  joint pain  muscle pain  stomach pain  trouble sleeping This list may not describe all possible side effects. Call your doctor for medical advice about side effects. You may report side effects to FDA at 1-800-FDA-1088. Where should I keep my medicine? This medicine is given in a hospital or clinic. It will not be stored at home. NOTE: This sheet is a summary. It may not cover all possible   information. If you have questions about this medicine, talk to your doctor, pharmacist, or health care provider.  2021 Elsevier/Gold Standard (2019-10-11 10:28:13)  

## 2020-11-21 ENCOUNTER — Telehealth (HOSPITAL_COMMUNITY): Payer: Self-pay | Admitting: *Deleted
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
## 2020-11-22 ENCOUNTER — Other Ambulatory Visit: Payer: Medicaid Other

## 2020-11-22 ENCOUNTER — Inpatient Hospital Stay: Payer: Medicaid Other

## 2020-11-23 ENCOUNTER — Telehealth: Payer: Self-pay
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
## 2020-11-24 ENCOUNTER — Inpatient Hospital Stay: Payer: Medicaid Other

## 2020-11-29 ENCOUNTER — Inpatient Hospital Stay: Payer: Medicaid Other | Admitting: Family

## 2020-11-29 ENCOUNTER — Inpatient Hospital Stay: Payer: Medicaid Other

## 2020-11-30 ENCOUNTER — Telehealth: Payer: Self-pay | Admitting: Oncology

## 2020-11-30 NOTE — Telephone Encounter (Signed)
Patient requested Transfer of Care from Dr Burney Gauze for Anemia/Thrombocytopenia due to relocating to [place]  Appt made for 12/18/20 Labs 1:00 pm - Consult  Medical Records are located in [facility] Chart

## 2020-12-01 ENCOUNTER — Other Ambulatory Visit (HOSPITAL_BASED_OUTPATIENT_CLINIC_OR_DEPARTMENT_OTHER): Payer: Self-pay

## 2020-12-07 ENCOUNTER — Other Ambulatory Visit: Payer: Self-pay | Admitting: Family Medicine

## 2020-12-07 DIAGNOSIS — M21621 Bunionette of right foot: Secondary | ICD-10-CM

## 2020-12-07 DIAGNOSIS — M79671 Pain in right foot: Secondary | ICD-10-CM

## 2020-12-07 DIAGNOSIS — Z9889 Other specified postprocedural states: Secondary | ICD-10-CM

## 2020-12-07 MED FILL — FLUoxetine HCL 40 MG CAPS: 40 | 30 days supply | Qty: 30 | Fill #0

## 2020-12-07 MED FILL — ARIPIPRAZOLE 10 MG TABS: 10 | 30 days supply | Qty: 30 | Fill #4

## 2020-12-07 MED FILL — GABAPENTIN 300 MG CAPSULE: 300 | 15 days supply | Qty: 90 | Fill #0

## 2020-12-07 MED FILL — clonazePAM 2 MG TABS: 2 | 30 days supply | Qty: 60 | Fill #1

## 2020-12-11 ENCOUNTER — Other Ambulatory Visit (HOSPITAL_COMMUNITY): Payer: Self-pay | Admitting: Psychiatry

## 2020-12-11 ENCOUNTER — Other Ambulatory Visit (HOSPITAL_COMMUNITY): Payer: Self-pay

## 2020-12-12 ENCOUNTER — Other Ambulatory Visit (HOSPITAL_COMMUNITY): Payer: Self-pay

## 2020-12-13 ENCOUNTER — Other Ambulatory Visit (HOSPITAL_COMMUNITY): Payer: Self-pay

## 2020-12-14 ENCOUNTER — Other Ambulatory Visit (HOSPITAL_COMMUNITY): Payer: Self-pay

## 2020-12-17 NOTE — Progress Notes (Signed)
[facility]  [address] [place],  [place]  [address] [phone]  Clinic Day:  12/18/2020  Referring physician: [name], FNP   HISTORY OF PRESENT ILLNESS:  The patient is a 50 y.o. female  who I was asked to consult upon for thrombocytopenia.  Per past records, alcoholic cirrrhosis and secondary splenomegaly are major reasons behind her low platelets.  However, there has also been an immune component to where she was receiving IV Nplate in [place].  Her last visit with hematology was in April 2021.  Apparently, she was a frequent no-show to those visits.  She comes in today to establish care here.  She is honest with her lifestyle/behavior.  She admits to drinking at least 12 beers daily, but says this is better than what it has been previously.  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  In fact, she claims to have used yesterday.  She claims she has overdosed twice within the past few weeks as she says her cocaine was apparently laced with Fentanyl.  Despite her long history of thrombocytopenia, she denies having any recent bleeding /bruising complications which have alerted her to severe thrombocytopenia being present.  In the past, she claims that CBCs have shown her to virtually have no platelets.  Per our hospital records, her lowest platelet reading was 22 in November 2018 PAST MEDICAL HISTORY:   Past Medical History:  Diagnosis Date  . Alcohol use   . Anxiety   . Bipolar affective disorder ([facility])    With anxiety features  . Bunionette of right foot 11/2019  . 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 Depression   . 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 ETOHism ([facility])   . GERD (gastroesophageal reflux disease)   . Hematemesis 02/10/2018  . Hepatitis C 2018   hepatitis c and alcohol related hepatitis  . Heroin abuse ([facility])   . History of blood transfusion    "blood doesn't clot; I fell down  and had to have a transfusion"  . History of kidney stones   . History of thrombocytopenia   . Menopause 2016  . Migraine    "when I get really stressed" (09/01/2017)  . S/P foot surgery, right 11/2019  . Schizophrenia ([facility])   . Seizures ([facility])    "when I run out of my RX; lots recently" (09/01/2017)  . Thrombocytopenia ([facility])   . Urinary urgency 05/2020    PAST SURGICAL HISTORY:   Past Surgical History:  Procedure Laterality Date  . ESOPHAGOGASTRODUODENOSCOPY N/A 09/03/2017   Procedure: ESOPHAGOGASTRODUODENOSCOPY (EGD);  Surgeon: Doran Stabler, MD;  Location: [facility];  Service: Gastroenterology;  Laterality: N/A;  . FINGER FRACTURE SURGERY Left    "shattered my pinky"  . FRACTURE SURGERY    . I & D EXTREMITY Left 09/18/2018   Procedure: IRRIGATION AND DEBRIDEMENT EXTREMITY;  Surgeon: Leanora Cover, MD;  Location: [facility];  Service: Orthopedics;  Laterality: Left;  . IR PARACENTESIS  07/23/2017  . IR PARACENTESIS  07/2017   "did it twice in the same week" (09/01/2017)  . SHOULDER OPEN ROTATOR CUFF REPAIR Right   . TOOTH EXTRACTION  08/2019  . TUBAL LIGATION    . VAGINAL HYSTERECTOMY      CURRENT MEDICATIONS:   Current Outpatient Medications  Medication Sig Dispense Refill  . ARIPiprazole (ABILIFY) 10 MG tablet TAKE 1 TABLET BY MOUTH DAILY 30 tablet  5  . clonazePAM (KLONOPIN) 2 MG tablet TAKE 1 TABLET BY MOUTH 2 TIMES DAILY AS NEEDED FOR ANXIETY 60 tablet 2  . diclofenac Sodium (VOLTAREN) 1 % GEL Apply 4 g topically 4 (four) times daily. 100 g 2  . famotidine (PEPCID) 20 MG tablet TAKE 1 TABLET BY MOUTH DAILY 90 tablet 1  . FLUoxetine (PROZAC) 40 MG capsule TAKE 1 CAPSULE BY MOUTH DAILY 30 capsule 5  . furosemide (LASIX) 40 MG tablet TAKE 1 TABLET BY MOUTH DAILY. 90 tablet 0  . gabapentin (NEURONTIN) 300 MG capsule TAKE 2 CAPSULES BY MOUTH THREE TIMES DAILY. NEED OFFICE VISIT FOR MORE REFILLS. 90 capsule 0  . lactulose (CHRONULAC) 10 GM/15ML solution Take 30 mLs (20 g  total) by mouth 3 (three) times daily. Goal to have 2-3 BMs daily 240 mL 3  . levETIRAcetam (KEPPRA) 500 MG tablet TAKE 1 TABLET BY MOUTH 2 TIMES DAILY 180 tablet 3  . mirtazapine (REMERON) 30 MG tablet TAKE 1 TABLET BY MOUTH AT BEDTIME 30 tablet 5  . Multiple Vitamin (MULTIVITAMIN WITH MINERALS) TABS tablet Take 1 tablet by mouth daily.    . nitrofurantoin, macrocrystal-monohydrate, (MACROBID) 100 MG capsule TAKE 1 CAPSULE BY MOUTH TWICE DAILY 14 capsule 0  . oxyCODONE (OXY IR/ROXICODONE) 5 MG immediate release tablet TAKE 1 TABLET (5 MG TOTAL) BY MOUTH EVERY 6 (SIX) HOURS AS NEEDED FOR SEVERE PAIN. 8 tablet 0  . polyethylene glycol (MIRALAX / GLYCOLAX) 17 g packet Take 17 g by mouth 2 (two) times daily. (Patient taking differently: Take 17 g by mouth daily as needed for mild constipation.) 14 each 0   No current facility-administered medications for this visit.    ALLERGIES:   Allergies  Allergen Reactions  . Other Other (See Comments)    Platelets: Rx chest pain, tremors, body aches    FAMILY HISTORY:   Family History  Problem Relation Age of Onset  . Lung cancer Mother 54  . Alcohol abuse Mother   . Throat cancer Father 58    SOCIAL HISTORY:  The patient was born in [place].  She lives in town.  She is divorced, with 2 children and 2 grandchildren.  She was a previous hairstylist.  She also worked in a [employer].  Her illicit drug use is as mentioned per HPI.  REVIEW OF SYSTEMS:  Review of Systems  Constitutional: Negative for fatigue and fever.  HENT:   Negative for hearing loss and sore throat.   Eyes: Negative for eye problems.  Respiratory: Negative for chest tightness, cough and hemoptysis.   Cardiovascular: Positive for chest pain (musculosketal in nature due to recent chest compressions). Negative for palpitations.  Gastrointestinal: Positive for constipation. Negative for abdominal distention, abdominal pain, blood in stool,  diarrhea, nausea and vomiting.  Endocrine: Negative for hot flashes.  Genitourinary: Negative for difficulty urinating, dysuria, frequency, hematuria and nocturia.   Musculoskeletal: Positive for arthralgias. Negative for back pain, gait problem and myalgias.  Skin: Negative.  Negative for itching and rash.  Neurological: Positive for dizziness and headaches. Negative for extremity weakness, gait problem, light-headedness and numbness.  Hematological: Negative.   Psychiatric/Behavioral: Negative.  Negative for depression and suicidal ideas. The patient is not nervous/anxious.      PHYSICAL EXAM:  Blood pressure (!) 142/87, pulse 78, temperature 97.7 F (36.5 C), resp. rate 16, height 4\' 11"  (1.499 m), weight 139 lb 1.6 oz (63.1 kg), SpO2 97 %. Wt Readings from Last 3 Encounters:  12/18/20  139 lb 1.6 oz (63.1 kg)  11/01/20 140 lb (63.5 kg)  10/25/20 133 lb (60.3 kg)   Body mass index is 28.09 kg/m. Performance status (ECOG): 1 Physical Exam Constitutional:      Appearance: Normal appearance. She is not ill-appearing.  HENT:     Mouth/Throat:     Mouth: Mucous membranes are moist.     Pharynx: Oropharynx is clear. No oropharyngeal exudate or posterior oropharyngeal erythema.  Cardiovascular:     Rate and Rhythm: Normal rate and regular rhythm.     Heart sounds: No murmur heard. No friction rub. No gallop.   Pulmonary:     Effort: Pulmonary effort is normal. No respiratory distress.     Breath sounds: Normal breath sounds. No wheezing, rhonchi or rales.  Chest:  Breasts:     Right: No axillary adenopathy or supraclavicular adenopathy.     Left: No axillary adenopathy or supraclavicular adenopathy.    Abdominal:     General: Bowel sounds are normal. There is no distension.     Palpations: Abdomen is soft. There is no mass.     Tenderness: There is no abdominal tenderness.  Musculoskeletal:        General: No swelling.     Right lower leg: No edema.     Left lower leg: No  edema.  Lymphadenopathy:     Cervical: No cervical adenopathy.     Upper Body:     Right upper body: No supraclavicular or axillary adenopathy.     Left upper body: No supraclavicular or axillary adenopathy.     Lower Body: No right inguinal adenopathy. No left inguinal adenopathy.  Skin:    General: Skin is warm.     Coloration: Skin is not jaundiced.     Findings: No lesion or rash.  Neurological:     General: No focal deficit present.     Mental Status: She is alert and oriented to person, place, and time. Mental status is at baseline.     Cranial Nerves: Cranial nerves are intact.  Psychiatric:        Mood and Affect: Mood normal.        Behavior: Behavior normal.        Thought Content: Thought content normal.    LABS:   CBC Latest Ref Rng & Units 12/18/2020 11/15/2020 11/01/2020  WBC - 3.0 2.6(L) 2.7(L)  Hemoglobin 12.0 - 16.0 13.6 14.3 14.0  Hematocrit 36 - 46 41 43.6 41.9  Platelets 150 - 399 23(A) 121(L) 80(L)   CMP Latest Ref Rng & Units 12/18/2020 11/15/2020 11/01/2020  Glucose 70 - 99 mg/dL - 93 119(H)  BUN 4 - 21 7 6 9   Creatinine 0.5 - 1.1 0.5 0.64 0.61  Sodium 137 - 147 135(A) 139 138  Potassium 3.4 - 5.3 4.3 3.7 3.5  Chloride 99 - 108 106 102 103  CO2 13 - 22 23(A) 33(H) 27  Calcium 8.7 - 10.7 8.9 9.8 9.7  Total Protein 6.5 - 8.1 g/dL - 7.3 7.7  Total Bilirubin 0.3 - 1.2 mg/dL - 1.6(H) 1.3(H)  Alkaline Phos 25 - 125 304(A) 183(H) 187(H)  AST 13 - 35 42(A) 30 30  ALT 7 - 35 20 17 17     ASSESSMENT & PLAN:  A 50 y.o. female who I was asked to consult upon for thrombocytopenia.  Although her cirrhosis and splenomegaly are playing a role into her low platelets, cirrhosis usually does not cause platelets to fall in the 20's, which  suggest an ITP component is also at play.  She also understands acute alcohol consumption, in the amounts she drinks, may also be triggering her low counts via acute bone marrow toxicity.  For completeness, her B12 and folate levels will be  checked to ensure no nutritional deficiencies have developed over time due to her chronically heavy alcoholism.  I will arrange for her to receive weekly Nplate.  I would be satisfied if I can just keep her platelets at least above 50.  The patient very well understands that her alcohol and drug addiction could very well take her life within the months ahead, particularly as it pertains to laced drugs causing her to overdose again, without anyone being around to rescue her.  This is an extremely disheartening and disconcerting case.  Nevertheless, we will do our best from a hematologic standpoint to keep her platelets at satisfactory levels.  She knows to stay in contact with her psychiatrist as it pertains to her drug addictions and her baseline psychiatric disorders.  I will see this patient back in 2 months for repeat clinical assessment.  The patient understands all the plans discussed today and is in agreement with them.  I do appreciate [name], FNP for his new consult.   Tawfiq Favila Macarthur Critchley, MD

## 2020-12-18 ENCOUNTER — Other Ambulatory Visit: Payer: Self-pay | Admitting: Oncology

## 2020-12-18 ENCOUNTER — Other Ambulatory Visit: Payer: Self-pay | Admitting: Hematology and Oncology

## 2020-12-18 ENCOUNTER — Inpatient Hospital Stay (INDEPENDENT_AMBULATORY_CARE_PROVIDER_SITE_OTHER): Payer: Medicaid Other | Admitting: Oncology

## 2020-12-18 ENCOUNTER — Other Ambulatory Visit: Payer: Self-pay

## 2020-12-18 ENCOUNTER — Inpatient Hospital Stay: Payer: Medicaid Other | Attending: Hematology & Oncology

## 2020-12-18 ENCOUNTER — Inpatient Hospital Stay: Payer: Medicaid Other

## 2020-12-18 VITALS — BP 141/95 | HR 87 | Temp 97.6°F | Resp 18

## 2020-12-18 VITALS — BP 142/87 | HR 78 | Temp 97.7°F | Resp 16 | Ht 59.0 in | Wt 139.1 lb

## 2020-12-18 DIAGNOSIS — D6959 Other secondary thrombocytopenia: Secondary | ICD-10-CM

## 2020-12-18 DIAGNOSIS — D693 Immune thrombocytopenic purpura: Secondary | ICD-10-CM | POA: Diagnosis present

## 2020-12-18 DIAGNOSIS — D696 Thrombocytopenia, unspecified: Secondary | ICD-10-CM

## 2020-12-18 DIAGNOSIS — T50905A Adverse effect of unspecified drugs, medicaments and biological substances, initial encounter: Secondary | ICD-10-CM

## 2020-12-18 DIAGNOSIS — K7031 Alcoholic cirrhosis of liver with ascites: Secondary | ICD-10-CM

## 2020-12-18 DIAGNOSIS — F1490 Cocaine use, unspecified, uncomplicated: Secondary | ICD-10-CM | POA: Diagnosis not present

## 2020-12-18 DIAGNOSIS — F1020 Alcohol dependence, uncomplicated: Secondary | ICD-10-CM | POA: Diagnosis not present

## 2020-12-18 DIAGNOSIS — K7030 Alcoholic cirrhosis of liver without ascites: Secondary | ICD-10-CM | POA: Diagnosis not present

## 2020-12-18 LAB — COMPREHENSIVE METABOLIC PANEL
Albumin: 3.9 (ref 3.5–5.0)
Calcium: 8.9 (ref 8.7–10.7)

## 2020-12-18 LAB — BASIC METABOLIC PANEL
BUN: 7 (ref 4–21)
CO2: 23 — AB (ref 13–22)
Chloride: 106 (ref 99–108)
Creatinine: 0.5 (ref 0.5–1.1)
Glucose: 90
Potassium: 4.3 (ref 3.4–5.3)
Sodium: 135 — AB (ref 137–147)

## 2020-12-18 LAB — HEPATIC FUNCTION PANEL
ALT: 20 (ref 7–35)
AST: 42 — AB (ref 13–35)
Alkaline Phosphatase: 304 — AB (ref 25–125)
Bilirubin, Total: 1.3

## 2020-12-18 LAB — CBC
Absolute Lymphocytes: 0.57 — AB (ref 0.65–4.75)
MCV: 95 (ref 81–99)
RBC: 4.37 (ref 3.87–5.11)

## 2020-12-18 LAB — FOLATE: Folate: 17.3 ng/mL (ref >5.9)

## 2020-12-18 LAB — CBC AND DIFFERENTIAL
HCT: 41 (ref 36–46)
Hemoglobin: 13.6 (ref 12.0–16.0)
Neutrophils Absolute: 1.89
Platelets: 23 — AB (ref 150–399)
WBC: 3

## 2020-12-18 LAB — VITAMIN B12: Vitamin B-12: 384 pg/mL (ref 180–914)

## 2020-12-18 LAB — TSH: TSH: 0.617 u[IU]/mL (ref 0.350–4.500)

## 2020-12-18 MED ORDER — ROMIPLOSTIM 125 MCG ~~LOC~~ SOLR
2.0000 ug/kg | Freq: Once | SUBCUTANEOUS | Status: AC
Start: 2020-12-18 — End: 2020-12-18
  Administered 2020-12-18: 125 ug via SUBCUTANEOUS
  Filled 2020-12-18: qty 0.25

## 2020-12-18 NOTE — Patient Instructions (Signed)
Romiplostim injection What is this medicine? ROMIPLOSTIM (roe mi PLOE stim) helps your body make more platelets. This medicine is used to treat low platelets caused by chronic idiopathic thrombocytopenic purpura (ITP) or a bone marrow syndrome caused by radiation sickness. This medicine may be used for other purposes; ask your health care provider or pharmacist if you have questions. COMMON BRAND NAME(S): Nplate What should I tell my health care provider before I take this medicine? They need to know if you have any of these conditions:  blood clots  myelodysplastic syndrome  an unusual or allergic reaction to romiplostim, mannitol, other medicines, foods, dyes, or preservatives  pregnant or trying to get pregnant  breast-feeding How should I use this medicine? This medicine is injected under the skin. It is given by a health care provider in a hospital or clinic setting. A special MedGuide will be given to you before each treatment. Be sure to read this information carefully each time. Talk to your health care provider about the use of this medicine in children. While it may be prescribed for children as young as newborns for selected conditions, precautions do apply. Overdosage: If you think you have taken too much of this medicine contact a poison control center or emergency room at once. NOTE: This medicine is only for you. Do not share this medicine with others. What if I miss a dose? Keep appointments for follow-up doses. It is important not to miss your dose. Call your health care provider if you are unable to keep an appointment. What may interact with this medicine? Interactions are not expected. This list may not describe all possible interactions. Give your health care provider a list of all the medicines, herbs, non-prescription drugs, or dietary supplements you use. Also tell them if you smoke, drink alcohol, or use illegal drugs. Some items may interact with your  medicine. What should I watch for while using this medicine? Visit your health care provider for regular checks on your progress. You may need blood work done while you are taking this medicine. Your condition will be monitored carefully while you are receiving this medicine. It is important not to miss any appointments. What side effects may I notice from receiving this medicine? Side effects that you should report to your doctor or health care professional as soon as possible:  allergic reactions (skin rash, itching or hives; swelling of the face, lips, or tongue)  bleeding (bloody or black, tarry stools; red or dark brown urine; spitting up blood or brown material that looks like coffee grounds; red spots on the skin; unusual bruising or bleeding from the eyes, gums, or nose)  blood clot (chest pain; shortness of breath; pain, swelling, or warmth in the leg)  stroke (changes in vision; confusion; trouble speaking or understanding; severe headaches; sudden numbness or weakness of the face, arm or leg; trouble walking; dizziness; loss of balance or coordination) Side effects that usually do not require medical attention (report to your doctor or health care professional if they continue or are bothersome):  diarrhea  dizziness  headache  joint pain  muscle pain  stomach pain  trouble sleeping This list may not describe all possible side effects. Call your doctor for medical advice about side effects. You may report side effects to FDA at 1-800-FDA-1088. Where should I keep my medicine? This medicine is given in a hospital or clinic. It will not be stored at home. NOTE: This sheet is a summary. It may not cover all possible   information. If you have questions about this medicine, talk to your doctor, pharmacist, or health care provider.  2021 Elsevier/Gold Standard (2019-10-11 10:28:13)  

## 2020-12-18 NOTE — Progress Notes (Unsigned)
t

## 2020-12-19 NOTE — Progress Notes (Signed)
Spoke with [facility], asked that they set her up with transportation on 04/18, 04/20, 04/25, and 04/27.

## 2020-12-25 ENCOUNTER — Other Ambulatory Visit: Payer: Self-pay | Admitting: Hematology and Oncology

## 2020-12-25 ENCOUNTER — Inpatient Hospital Stay: Payer: Medicaid Other

## 2020-12-25 ENCOUNTER — Other Ambulatory Visit: Payer: Self-pay

## 2020-12-25 DIAGNOSIS — D696 Thrombocytopenia, unspecified: Secondary | ICD-10-CM

## 2020-12-25 LAB — HEPATIC FUNCTION PANEL
ALT: 32 (ref 7–35)
AST: 68 — AB (ref 13–35)
Alkaline Phosphatase: 326 — AB (ref 25–125)
Bilirubin, Total: 2.4

## 2020-12-25 LAB — CBC: RBC: 4.58 (ref 3.87–5.11)

## 2020-12-25 LAB — BASIC METABOLIC PANEL
BUN: 10 (ref 4–21)
CO2: 32 — AB (ref 13–22)
Chloride: 100 (ref 99–108)
Creatinine: 0.6 (ref 0.5–1.1)
Glucose: 92
Potassium: 3.7 (ref 3.4–5.3)
Sodium: 137 (ref 137–147)

## 2020-12-25 LAB — COMPREHENSIVE METABOLIC PANEL
Albumin: 4.1 (ref 3.5–5.0)
Calcium: 8.8 (ref 8.7–10.7)

## 2020-12-25 LAB — CBC AND DIFFERENTIAL
HCT: 43 (ref 36–46)
Hemoglobin: 14.5 (ref 12.0–16.0)
Neutrophils Absolute: 1.82
Platelets: 40 — AB (ref 150–399)
WBC: 2.8

## 2020-12-26 NOTE — Addendum Note (Signed)
Addended by: Juanetta Beets on: 12/26/2020 01:48 PM   Modules accepted: Orders

## 2020-12-27 ENCOUNTER — Other Ambulatory Visit: Payer: Self-pay

## 2020-12-27 ENCOUNTER — Inpatient Hospital Stay: Payer: Medicaid Other

## 2020-12-27 VITALS — BP 117/75 | HR 93 | Temp 98.3°F | Resp 18 | Ht 59.0 in | Wt 139.5 lb

## 2020-12-27 DIAGNOSIS — D696 Thrombocytopenia, unspecified: Secondary | ICD-10-CM

## 2020-12-27 DIAGNOSIS — D693 Immune thrombocytopenic purpura: Secondary | ICD-10-CM | POA: Diagnosis not present

## 2020-12-27 MED ORDER — ROMIPLOSTIM 250 MCG ~~LOC~~ SOLR
3.0000 ug/kg | Freq: Once | SUBCUTANEOUS | Status: AC
Start: 2020-12-27 — End: 2020-12-27
  Administered 2020-12-27: 190 ug via SUBCUTANEOUS
  Filled 2020-12-27: qty 0.38

## 2020-12-27 NOTE — Patient Instructions (Signed)
Romiplostim injection What is this medicine? ROMIPLOSTIM (roe mi PLOE stim) helps your body make more platelets. This medicine is used to treat low platelets caused by chronic idiopathic thrombocytopenic purpura (ITP) or a bone marrow syndrome caused by radiation sickness. This medicine may be used for other purposes; ask your health care provider or pharmacist if you have questions. COMMON BRAND NAME(S): Nplate What should I tell my health care provider before I take this medicine? They need to know if you have any of these conditions:  blood clots  myelodysplastic syndrome  an unusual or allergic reaction to romiplostim, mannitol, other medicines, foods, dyes, or preservatives  pregnant or trying to get pregnant  breast-feeding How should I use this medicine? This medicine is injected under the skin. It is given by a health care provider in a hospital or clinic setting. A special MedGuide will be given to you before each treatment. Be sure to read this information carefully each time. Talk to your health care provider about the use of this medicine in children. While it may be prescribed for children as young as newborns for selected conditions, precautions do apply. Overdosage: If you think you have taken too much of this medicine contact a poison control center or emergency room at once. NOTE: This medicine is only for you. Do not share this medicine with others. What if I miss a dose? Keep appointments for follow-up doses. It is important not to miss your dose. Call your health care provider if you are unable to keep an appointment. What may interact with this medicine? Interactions are not expected. This list may not describe all possible interactions. Give your health care provider a list of all the medicines, herbs, non-prescription drugs, or dietary supplements you use. Also tell them if you smoke, drink alcohol, or use illegal drugs. Some items may interact with your  medicine. What should I watch for while using this medicine? Visit your health care provider for regular checks on your progress. You may need blood work done while you are taking this medicine. Your condition will be monitored carefully while you are receiving this medicine. It is important not to miss any appointments. What side effects may I notice from receiving this medicine? Side effects that you should report to your doctor or health care professional as soon as possible:  allergic reactions (skin rash, itching or hives; swelling of the face, lips, or tongue)  bleeding (bloody or black, tarry stools; red or dark brown urine; spitting up blood or brown material that looks like coffee grounds; red spots on the skin; unusual bruising or bleeding from the eyes, gums, or nose)  blood clot (chest pain; shortness of breath; pain, swelling, or warmth in the leg)  stroke (changes in vision; confusion; trouble speaking or understanding; severe headaches; sudden numbness or weakness of the face, arm or leg; trouble walking; dizziness; loss of balance or coordination) Side effects that usually do not require medical attention (report to your doctor or health care professional if they continue or are bothersome):  diarrhea  dizziness  headache  joint pain  muscle pain  stomach pain  trouble sleeping This list may not describe all possible side effects. Call your doctor for medical advice about side effects. You may report side effects to FDA at 1-800-FDA-1088. Where should I keep my medicine? This medicine is given in a hospital or clinic. It will not be stored at home. NOTE: This sheet is a summary. It may not cover all possible   information. If you have questions about this medicine, talk to your doctor, pharmacist, or health care provider.  2021 Elsevier/Gold Standard (2019-10-11 10:28:13)  

## 2021-01-01 ENCOUNTER — Other Ambulatory Visit: Payer: Self-pay | Admitting: Hematology and Oncology

## 2021-01-01 ENCOUNTER — Ambulatory Visit: Payer: Self-pay

## 2021-01-01 ENCOUNTER — Telehealth: Payer: Self-pay | Admitting: Oncology

## 2021-01-01 ENCOUNTER — Other Ambulatory Visit (HOSPITAL_COMMUNITY): Payer: Self-pay

## 2021-01-01 ENCOUNTER — Other Ambulatory Visit: Payer: Self-pay

## 2021-01-01 ENCOUNTER — Inpatient Hospital Stay: Payer: Medicaid Other

## 2021-01-01 ENCOUNTER — Other Ambulatory Visit (HOSPITAL_COMMUNITY): Payer: Self-pay | Admitting: Psychiatry

## 2021-01-01 DIAGNOSIS — D696 Thrombocytopenia, unspecified: Secondary | ICD-10-CM

## 2021-01-01 LAB — CBC AND DIFFERENTIAL
HCT: 43 (ref 36–46)
Hemoglobin: 14.6 (ref 12.0–16.0)
Neutrophils Absolute: 1.86
Platelets: 75 — AB (ref 150–399)
WBC: 3

## 2021-01-01 LAB — CBC
Absolute Lymphocytes: 0.54 — AB (ref 0.675–4.75)
MCV: 94 (ref 81–99)
RBC: 4.62 (ref 3.87–5.11)

## 2021-01-01 NOTE — Telephone Encounter (Signed)
Patient called to verify today's Appt °

## 2021-01-02 ENCOUNTER — Telehealth: Payer: Self-pay | Admitting: Oncology

## 2021-01-02 NOTE — Progress Notes (Addendum)
Called and spoke with [facility], set up transportation for [place] 05/02, 05/04, 05/09, 05/11, 05/16 and 05/18.

## 2021-01-02 NOTE — Telephone Encounter (Signed)
01/02/21 Spoke with patient and confirmed all appts

## 2021-01-03 ENCOUNTER — Inpatient Hospital Stay: Payer: Medicaid Other

## 2021-01-03 ENCOUNTER — Other Ambulatory Visit: Payer: Self-pay

## 2021-01-03 VITALS — BP 136/87 | HR 96 | Temp 98.0°F | Resp 18 | Ht 59.0 in | Wt 140.0 lb

## 2021-01-03 DIAGNOSIS — D696 Thrombocytopenia, unspecified: Secondary | ICD-10-CM

## 2021-01-03 MED ORDER — ROMIPLOSTIM 250 MCG ~~LOC~~ SOLR
3.0000 ug/kg | Freq: Once | SUBCUTANEOUS | Status: DC
  Filled 2021-01-03: qty 0.38

## 2021-01-03 NOTE — Patient Instructions (Signed)
Romiplostim injection What is this medicine? ROMIPLOSTIM (roe mi PLOE stim) helps your body make more platelets. This medicine is used to treat low platelets caused by chronic idiopathic thrombocytopenic purpura (ITP) or a bone marrow syndrome caused by radiation sickness. This medicine may be used for other purposes; ask your health care provider or pharmacist if you have questions. COMMON BRAND NAME(S): Nplate What should I tell my health care provider before I take this medicine? They need to know if you have any of these conditions:  blood clots  myelodysplastic syndrome  an unusual or allergic reaction to romiplostim, mannitol, other medicines, foods, dyes, or preservatives  pregnant or trying to get pregnant  breast-feeding How should I use this medicine? This medicine is injected under the skin. It is given by a health care provider in a hospital or clinic setting. A special MedGuide will be given to you before each treatment. Be sure to read this information carefully each time. Talk to your health care provider about the use of this medicine in children. While it may be prescribed for children as young as newborns for selected conditions, precautions do apply. Overdosage: If you think you have taken too much of this medicine contact a poison control center or emergency room at once. NOTE: This medicine is only for you. Do not share this medicine with others. What if I miss a dose? Keep appointments for follow-up doses. It is important not to miss your dose. Call your health care provider if you are unable to keep an appointment. What may interact with this medicine? Interactions are not expected. This list may not describe all possible interactions. Give your health care provider a list of all the medicines, herbs, non-prescription drugs, or dietary supplements you use. Also tell them if you smoke, drink alcohol, or use illegal drugs. Some items may interact with your  medicine. What should I watch for while using this medicine? Visit your health care provider for regular checks on your progress. You may need blood work done while you are taking this medicine. Your condition will be monitored carefully while you are receiving this medicine. It is important not to miss any appointments. What side effects may I notice from receiving this medicine? Side effects that you should report to your doctor or health care professional as soon as possible:  allergic reactions (skin rash, itching or hives; swelling of the face, lips, or tongue)  bleeding (bloody or black, tarry stools; red or dark brown urine; spitting up blood or brown material that looks like coffee grounds; red spots on the skin; unusual bruising or bleeding from the eyes, gums, or nose)  blood clot (chest pain; shortness of breath; pain, swelling, or warmth in the leg)  stroke (changes in vision; confusion; trouble speaking or understanding; severe headaches; sudden numbness or weakness of the face, arm or leg; trouble walking; dizziness; loss of balance or coordination) Side effects that usually do not require medical attention (report to your doctor or health care professional if they continue or are bothersome):  diarrhea  dizziness  headache  joint pain  muscle pain  stomach pain  trouble sleeping This list may not describe all possible side effects. Call your doctor for medical advice about side effects. You may report side effects to FDA at 1-800-FDA-1088. Where should I keep my medicine? This medicine is given in a hospital or clinic. It will not be stored at home. NOTE: This sheet is a summary. It may not cover all possible   information. If you have questions about this medicine, talk to your doctor, pharmacist, or health care provider.  2021 Elsevier/Gold Standard (2019-10-11 10:28:13)  

## 2021-01-04 ENCOUNTER — Telehealth (INDEPENDENT_AMBULATORY_CARE_PROVIDER_SITE_OTHER): Payer: Self-pay | Admitting: Psychiatry

## 2021-01-04 ENCOUNTER — Other Ambulatory Visit (HOSPITAL_COMMUNITY): Payer: Self-pay | Admitting: Psychiatry

## 2021-01-04 ENCOUNTER — Other Ambulatory Visit (HOSPITAL_COMMUNITY): Payer: Self-pay

## 2021-01-04 DIAGNOSIS — F39 Unspecified mood [affective] disorder: Secondary | ICD-10-CM

## 2021-01-04 NOTE — Progress Notes (Signed)
Patient was no show on video call.  To be rescheduled.

## 2021-01-05 ENCOUNTER — Other Ambulatory Visit: Payer: Self-pay | Admitting: Nurse Practitioner

## 2021-01-05 ENCOUNTER — Other Ambulatory Visit (HOSPITAL_COMMUNITY): Payer: Self-pay

## 2021-01-05 ENCOUNTER — Other Ambulatory Visit: Payer: Self-pay | Admitting: Family Medicine

## 2021-01-05 ENCOUNTER — Other Ambulatory Visit (HOSPITAL_COMMUNITY): Payer: Self-pay | Admitting: Psychiatry

## 2021-01-05 DIAGNOSIS — Z9889 Other specified postprocedural states: Secondary | ICD-10-CM

## 2021-01-05 DIAGNOSIS — M79671 Pain in right foot: Secondary | ICD-10-CM

## 2021-01-05 DIAGNOSIS — M21621 Bunionette of right foot: Secondary | ICD-10-CM

## 2021-01-05 DIAGNOSIS — D696 Thrombocytopenia, unspecified: Secondary | ICD-10-CM

## 2021-01-05 MED FILL — Fluoxetine HCl Cap 40 MG: ORAL | 30 days supply | Qty: 30 | Fill #0 | Status: AC

## 2021-01-05 MED FILL — Aripiprazole Tab 10 MG: ORAL | 30 days supply | Qty: 30 | Fill #0 | Status: AC

## 2021-01-05 MED FILL — Clonazepam Tab 2 MG: ORAL | 30 days supply | Qty: 60 | Fill #0 | Status: AC

## 2021-01-06 ENCOUNTER — Other Ambulatory Visit (HOSPITAL_COMMUNITY): Payer: Self-pay

## 2021-01-08 ENCOUNTER — Other Ambulatory Visit: Payer: Self-pay | Admitting: Hematology and Oncology

## 2021-01-08 ENCOUNTER — Other Ambulatory Visit: Payer: Self-pay

## 2021-01-08 ENCOUNTER — Other Ambulatory Visit (HOSPITAL_COMMUNITY): Payer: Self-pay

## 2021-01-08 ENCOUNTER — Inpatient Hospital Stay: Payer: Medicaid Other | Attending: Hematology & Oncology

## 2021-01-08 DIAGNOSIS — D696 Thrombocytopenia, unspecified: Secondary | ICD-10-CM

## 2021-01-08 DIAGNOSIS — D693 Immune thrombocytopenic purpura: Secondary | ICD-10-CM | POA: Insufficient documentation

## 2021-01-08 LAB — CBC AND DIFFERENTIAL
HCT: 43 (ref 36–46)
Hemoglobin: 14 (ref 12.0–16.0)
Neutrophils Absolute: 3
Platelets: 85 — AB (ref 150–399)
WBC: 4

## 2021-01-08 LAB — CBC
Absolute Lymphocytes: 0.48 — AB (ref 0.65–4.75)
MCV: 95 (ref 81–99)
RBC: 4.51 (ref 3.87–5.11)

## 2021-01-09 ENCOUNTER — Encounter (HOSPITAL_COMMUNITY): Payer: Self-pay | Admitting: Psychiatry

## 2021-01-09 ENCOUNTER — Other Ambulatory Visit: Payer: Self-pay | Admitting: Nurse Practitioner

## 2021-01-09 ENCOUNTER — Other Ambulatory Visit (HOSPITAL_COMMUNITY): Payer: Self-pay

## 2021-01-09 ENCOUNTER — Telehealth (INDEPENDENT_AMBULATORY_CARE_PROVIDER_SITE_OTHER): Payer: Medicaid Other | Admitting: Psychiatry

## 2021-01-09 DIAGNOSIS — F1994 Other psychoactive substance use, unspecified with psychoactive substance-induced mood disorder: Secondary | ICD-10-CM | POA: Diagnosis not present

## 2021-01-09 DIAGNOSIS — F1021 Alcohol dependence, in remission: Secondary | ICD-10-CM | POA: Diagnosis not present

## 2021-01-09 DIAGNOSIS — M21621 Bunionette of right foot: Secondary | ICD-10-CM

## 2021-01-09 DIAGNOSIS — Z9889 Other specified postprocedural states: Secondary | ICD-10-CM

## 2021-01-09 DIAGNOSIS — M79671 Pain in right foot: Secondary | ICD-10-CM

## 2021-01-09 MED ORDER — GABAPENTIN 300 MG PO CAPS
ORAL_CAPSULE | ORAL | 0 refills | Status: DC
  Filled 2021-01-09: qty 90, 15d supply, fill #0

## 2021-01-09 NOTE — Progress Notes (Signed)
Jeff MD/PA/NP OP Progress Note  01/14/2021 3:16 PM Briana Sweeney  MRN:  [id number] Interview was conducted by phone and I verified that I was speaking with the correct person using two identifiers. I discussed the limitations of evaluation and management by telemedicine and  the availability of in person appointments. Briana Sweeney expressed understanding and agreed to proceed. Participants in the visit: Briana Sweeney (location - home); physician (location - home office).  Chief Complaint: Anxiety.  HPI: 50 yo divorced female with a long hx of alcohol use disorder, polysubstance abuse (cocaine, heroin), and past diagnoses of major depressive disorder vs substance-induced mood disorder vs bipolar disorder. Briana Sweeney was a Briana Sweeney of Dr.Pucilowska who is no longer with [facility]. Briana Sweeney today reports increased anxiety and says it is due to her her schizophrenia (does not seem to be substantiated). Reports stress from her daughter.  Briana Sweeney is taking clonazepam 4mg  daily on a regular basis and not prn, she continues to endorse anxiety. We discussed the dependency on benzodiazepines an dhow long term use may not alleviate anxiety. Tried to discuss how her other medications of fluoxetine, mirtazapine and abilify were working for her mood and anxiety symptoms, however Briana Sweeney was vague and fixed on the klonopin. She denies any suicidal thoughts.   Per Dr.Pucilowska, "Diagnosis of schizophrenia is also mentioned in the record but Briana Sweeney is unable to provide any clear history of psychotic episodes unrelated to her drug/alcohol use. She was first treated for mental problems at age 50 when she became depressed and OD on meds. She has been taking fluoxetine (up to 60 mg) for over 20 years per her report. She also tried buspirone, lorazepam and hydroxyzine for anxiety with no clear benefit. Recently prescribed trazodone for insomnia does not appear to work either. She has never been on any mood stabilizers except for lithium  or antipsychotics apart from haloperidol. She did not like how she felt on either of those. She started abusing alcohol at age of 50 At one time she was drinking a 1/2 gallon of vodka plus some beer on a daily basis. She has a hx of alcohol withdrawal delirium. She has some periods of sobriety lasting few months at a time but cannot provide more details. She has alcohol liver cirrhosis, thrombocytopenia and hepatitis C. She minimizes use of illicit drugs in the past claiming that she only used them when "partying".  We have added mirtazapine and she reports that sleep has improved. She continues to complain primarily of severe anxiety both generalized and panic type. She has no support, lives alone, struggles financially. [name] reports that she has not been drinking alcohol last nine months. She has not been taking buspirone or hydroxyzine as they were ineffective for anxiety. She was then started on clonazepam for panic attacks which she found helpful for anxiety. She lost granddaughter in September and clonazepam was changed to lorazepam - she initially thought it was more effective but now has changed her mind and would like to return to taking clonazepam.      Visit Diagnosis:    ICD-10-CM   1. Substance induced mood disorder ([place])  F19.94   2. Alcohol use disorder, severe, in early remission ([place])  F10.21     Past Psychiatric History: Please see intake H&P.  Past Medical History:  Past Medical History:  Diagnosis Date  . Alcohol use   . Anxiety   . Bipolar affective disorder ([place])    With anxiety features  . B[place]ette of right foot 11/2019  .  Cirrhosis of liver ([place])    Due to alcohol and hepatitis C  . Depression   . 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 ETOHism ([place])   . GERD (gastroesophageal reflux disease)   . Hematemesis 02/10/2018  . Hepatitis C 2018   hepatitis c and alcohol related hepatitis  . Heroin abuse ([place])   . History of blood transfusion    "blood doesn't  clot; I fell down and had to have a transfusion"  . History of kidney stones   . History of thrombocytopenia   . Menopause 2016  . Migraine    "when I get really stressed" (09/01/2017)  . S/P foot surgery, right 11/2019  . Schizophrenia ([place])   . Seizures ([place])    "when I run out of my RX; lots recently" (09/01/2017)  . Thrombocytopenia ([place])   . Urinary urgency 05/2020    Past Surgical History:  Procedure Laterality Date  . ESOPHAGOGASTRODUODENOSCOPY N/A 09/03/2017   Procedure: ESOPHAGOGASTRODUODENOSCOPY (EGD);  Surgeon: Doran Stabler, MD;  Location: [facility];  Service: Gastroenterology;  Laterality: N/A;  . FINGER FRACTURE SURGERY Left    "shattered my pinky"  . FRACTURE SURGERY    . I & D EXTREMITY Left 09/18/2018   Procedure: IRRIGATION AND DEBRIDEMENT EXTREMITY;  Surgeon: Leanora Cover, MD;  Location: [facility];  Service: Orthopedics;  Laterality: Left;  . IR PARACENTESIS  07/23/2017  . IR PARACENTESIS  07/2017   "did it twice in the same week" (09/01/2017)  . SHOULDER OPEN ROTATOR CUFF REPAIR Right   . TOOTH EXTRACTION  08/2019  . TUBAL LIGATION    . VAGINAL HYSTERECTOMY      Family Psychiatric History: Reviewed.  Family History:  Family History  Problem Relation Age of Onset  . Lung cancer Mother 52  . Alcohol abuse Mother   . Throat cancer Father 26    Social History:  Social History   Socioeconomic History  . Marital status: Divorced    Spouse name: Not on file  . Number of children: 2  . Years of education: Not on file  . Highest education level: Not on file  Occupational History  . Occupation: applying for disability  Tobacco Use  . Smoking status: Never Smoker  . Smokeless tobacco: Never Used  Vaping Use  . Vaping Use: Never used  Substance and Sexual Activity  . Alcohol use: Not Currently    Alcohol/week: 6.0 standard drinks    Types: 6 Cans of beer per week    Comment: weekly  . Drug use: Not Currently  . Sexual activity: Not on file   Other Topics Concern  . Not on file  Social History Narrative   She moved with a boyfriend to [place] and was [facility] at [facility].  He died of a massive heart attack in 8/18, per her report, and so she moved back to [place] and is living with a friend.      Right handed   2 story home    Lives alone 11/09/19   Social Determinants of Health   Financial Resource Strain: Not on file  Food Insecurity: Not on file  Transportation Needs: Not on file  Physical Activity: Not on file  Stress: Not on file  Social Connections: Not on file    Allergies:  Allergies  Allergen Reactions  . Other Other (See Comments)    Platelets: Rx chest pain, tremors, body aches    Metabolic Disorder Labs: Lab Results  Component Value  Date   HGBA1C 4.9 05/26/2020   HGBA1C 4.9 05/26/2020   HGBA1C 4.9 (A) 05/26/2020   HGBA1C 4.9 05/26/2020   MPG 100 12/15/2019   No results found for: PROLACTIN Lab Results  Component Value Date   CHOL 213 (H) 12/13/2019   TRIG 85 12/13/2019   HDL 108 12/13/2019   CHOLHDL 2.0 12/13/2019   LDLCALC 90 12/13/2019   LDLCALC 89 05/05/2019   Lab Results  Component Value Date   TSH 0.617 12/18/2020   TSH 0.304 (L) 12/16/2019    Therapeutic Level Labs: No results found for: LITHIUM No results found for: VALPROATE No components found for:  CBMZ  Current Medications: Current Outpatient Medications  Medication Sig Dispense Refill  . ARIPiprazole (ABILIFY) 10 MG tablet TAKE 1 TABLET BY MOUTH DAILY 30 tablet 5  . clonazePAM (KLONOPIN) 2 MG tablet TAKE 1 TABLET BY MOUTH 2 TIMES DAILY AS NEEDED FOR ANXIETY 60 tablet 2  . diclofenac Sodium (VOLTAREN) 1 % GEL Apply 4 g topically 4 (four) times daily. 100 g 2  . famotidine (PEPCID) 20 MG tablet TAKE 1 TABLET BY MOUTH DAILY 90 tablet 1  . FLUoxetine (PROZAC) 40 MG capsule TAKE 1 CAPSULE BY MOUTH DAILY 30 capsule 5  . furosemide (LASIX) 40 MG tablet TAKE 1 TABLET BY MOUTH DAILY. 90 tablet 0  . gabapentin  (NEURONTIN) 300 MG capsule TAKE 2 CAPSULES BY MOUTH 3 TIMES DAILY. NEED OFFICE VISIT FOR MORE REFILLS. 90 capsule 0  . lactulose (CHRONULAC) 10 GM/15ML solution Take 30 mLs (20 g total) by mouth 3 (three) times daily. Goal to have 2-3 BMs daily 240 mL 3  . levETIRAcetam (KEPPRA) 500 MG tablet TAKE 1 TABLET BY MOUTH 2 TIMES DAILY 180 tablet 3  . mirtazapine (REMERON) 30 MG tablet TAKE 1 TABLET BY MOUTH AT BEDTIME 30 tablet 5  . Multiple Vitamin (MULTIVITAMIN WITH MINERALS) TABS tablet Take 1 tablet by mouth daily.    . nitrofurantoin, macrocrystal-monohydrate, (MACROBID) 100 MG capsule TAKE 1 CAPSULE BY MOUTH TWICE DAILY 14 capsule 0  . oxyCODONE (OXY IR/ROXICODONE) 5 MG immediate release tablet TAKE 1 TABLET (5 MG TOTAL) BY MOUTH EVERY 6 (SIX) HOURS AS NEEDED FOR SEVERE PAIN. 8 tablet 0  . polyethylene glycol (MIRALAX / GLYCOLAX) 17 g packet Take 17 g by mouth 2 (two) times daily. (Briana Sweeney taking differently: Take 17 g by mouth daily as needed for mild constipation.) 14 each 0   No current facility-administered medications for this visit.     Psychiatric Specialty Exam: Review of Systems  Psychiatric/Behavioral: The Briana Sweeney is nervous/anxious.   All other systems reviewed and are negative.   There were no vitals taken for this visit.There is no height or weight on file to calculate BMI.  General Appearance: NA  Eye Contact:  NA  Speech:  Clear and Coherent and Normal Rate  Volume:  Normal  Mood:  Anxious  Affect:  NA  Thought Process:  Goal Directed and Linear  Orientation:  Full (Time, Place, and Person)  Thought Content: Logical   Suicidal Thoughts:  No  Homicidal Thoughts:  No  Memory:  Immediate;   Good Recent;   Good Remote;   Good  Judgement:  Fair  Insight:  Fair  Psychomotor Activity:  NA  Concentration:  Concentration: Good  Recall:  Good  Fund of Knowledge: Good  Language: Good  Akathisia:  Negative  Handed:  Right  AIMS (if indicated): not done  Assets:   Communication Skills Desire for Improvement  Housing Resilience  ADL's:  Intact  Cognition: WNL  Sleep:  Good   Screenings: AIMS   Flowsheet Row Admission (Discharged) from 08/25/2018 in [facility] Admission (Discharged) from 10/28/2017 in [facility]  AIMS Total Score 0 0    AUDIT   Flowsheet Row Admission (Discharged) from 08/25/2018 in [facility] Admission (Discharged) from 10/28/2017 in [facility]  Alcohol Use Disorder Identification Test Final Score (AUDIT) 40 32    PHQ2-9   [facility] Office Visit from 05/26/2020 in [facility] Office Visit from 05/05/2019 in [facility] Office Visit from 04/22/2019 in [facility] for [facility] Office Visit from 02/19/2019 in [facility] Office Visit from 09/29/2018 in [facility]  PHQ-2 Total Score 0 0 0 0 0    [facility] ED from 09/27/2020 in [facility] ED to Hosp-Admission (Discharged) from 08/22/2018 in [facility] CATEGORY No Risk Low Risk       Assessment and Plan: 50 yo divorced female with a long hx of alcohol use disorder, polysubstance abuse (cocaine, heroin), and past diagnoses of major depressive disorder vs substance-induced mood disorder vs bipolar disorder. Diagnosis of schizophrenia is also mentioned in the record but Briana Sweeney is unable to provide any clear history of psychotic episodes unrelated to her drug/alcohol use. She was first treated for mental problems at age 82 when she became depressed and OD on meds. She has been taking fluoxetine (up to 60 mg) for over 20 years per her report. She also tried buspirone, lorazepam and hydroxyzine for anxiety with no clear benefit. Recently prescribed trazodone for insomnia  does not appear to work either. She has never been on any mood stabilizers except for lithium or antipsychotics apart from haloperidol. She did not like how she felt on either of those. She started abusing alcohol at age of 44. At one time she was drinking a 1/2 gallon of vodka plus some beer on a daily basis. She has a hx of alcohol withdrawal delirium. She has some periods of sobriety lasting few months at a time but cannot provide more details. She has alcohol liver cirrhosis, thrombocytopenia and hepatitis C. She minimizes use of illicit drugs in the past claiming that she only used them when "partying".  We have added mirtazapine and she reports that sleep has improved. She continues to complain primarily of severe anxiety both generalized and panic type. She has no support, lives alone, struggles financially. Crysten reports that she has not been drinking alcohol last nine months. She has not been taking buspirone or hydroxyzine as they were ineffective for anxiety. She was then started on clonazepam for panic attacks which she found helpful for anxiety. She lost granddaughter in September and clonazepam was changed to lorazepam - she initially thought it was more effective but now has changed her mind and would like to return to taking clonazepam.     Dx: Mixed anxiety disorder; Mood disorder unspecified (Drug-induced vs bipolar disorder unspecified); Alcohol use disorder severe in early remission   Plan: Continue fluoxetine 40  mg daily, Abilify to 10 mg, Remeron 30 mg and clonazepam 2 mg prn anxiety (bid now). We discussed  polypharmacy and also use of benzodiazepines leading to dependency. Briana Sweeney however reports that she takes the alprazolam as needed  and she still has some left. On checking Briana Sweeney's medication refills, she has obtained her refills on the clonazepam, monthly (taking 4mg  daily), but has not filled the mirtazapine, Abilify and has only filled the fluoxetine once since early  March. Briana Sweeney appears to be dependant/abusing the Lorazepam, not willing to taper or consider other options, appears to be non compliant with her other medications.  If Briana Sweeney calls for clonazepam refills, covering physician should be aware of the abuse of the medication and taper Briana Sweeney on the clonazepam.  Since this clinician does not prescribe benzodiazepines on long term basis, Briana Sweeney will be given choice of providers in the community to follow up with. The plan was discussed with Briana Sweeney who had an opportunity to ask questions and these were all answered. I spend 15 minutes in phone consultation with the Briana Sweeney.  I spent 20 minutes on chart review.     Elvin So, MD 01/14/2021, 3:16 PM

## 2021-01-10 ENCOUNTER — Inpatient Hospital Stay: Payer: Medicaid Other

## 2021-01-10 ENCOUNTER — Other Ambulatory Visit: Payer: Self-pay

## 2021-01-10 VITALS — BP 119/77 | HR 75 | Temp 98.4°F | Resp 16 | Ht 59.0 in | Wt 140.0 lb

## 2021-01-10 DIAGNOSIS — D696 Thrombocytopenia, unspecified: Secondary | ICD-10-CM

## 2021-01-10 DIAGNOSIS — D693 Immune thrombocytopenic purpura: Secondary | ICD-10-CM | POA: Diagnosis not present

## 2021-01-10 MED ORDER — ROMIPLOSTIM 250 MCG ~~LOC~~ SOLR
3.0000 ug/kg | Freq: Once | SUBCUTANEOUS | Status: AC
Start: 2021-01-10 — End: 2021-01-10
  Administered 2021-01-10: 190 ug via SUBCUTANEOUS
  Filled 2021-01-10: qty 0.38

## 2021-01-10 NOTE — Patient Instructions (Signed)
Romiplostim injection What is this medicine? ROMIPLOSTIM (roe mi PLOE stim) helps your body make more platelets. This medicine is used to treat low platelets caused by chronic idiopathic thrombocytopenic purpura (ITP) or a bone marrow syndrome caused by radiation sickness. This medicine may be used for other purposes; ask your health care provider or pharmacist if you have questions. COMMON BRAND NAME(S): Nplate What should I tell my health care provider before I take this medicine? They need to know if you have any of these conditions:  blood clots  myelodysplastic syndrome  an unusual or allergic reaction to romiplostim, mannitol, other medicines, foods, dyes, or preservatives  pregnant or trying to get pregnant  breast-feeding How should I use this medicine? This medicine is injected under the skin. It is given by a health care provider in a hospital or clinic setting. A special MedGuide will be given to you before each treatment. Be sure to read this information carefully each time. Talk to your health care provider about the use of this medicine in children. While it may be prescribed for children as young as newborns for selected conditions, precautions do apply. Overdosage: If you think you have taken too much of this medicine contact a poison control center or emergency room at once. NOTE: This medicine is only for you. Do not share this medicine with others. What if I miss a dose? Keep appointments for follow-up doses. It is important not to miss your dose. Call your health care provider if you are unable to keep an appointment. What may interact with this medicine? Interactions are not expected. This list may not describe all possible interactions. Give your health care provider a list of all the medicines, herbs, non-prescription drugs, or dietary supplements you use. Also tell them if you smoke, drink alcohol, or use illegal drugs. Some items may interact with your  medicine. What should I watch for while using this medicine? Visit your health care provider for regular checks on your progress. You may need blood work done while you are taking this medicine. Your condition will be monitored carefully while you are receiving this medicine. It is important not to miss any appointments. What side effects may I notice from receiving this medicine? Side effects that you should report to your doctor or health care professional as soon as possible:  allergic reactions (skin rash, itching or hives; swelling of the face, lips, or tongue)  bleeding (bloody or black, tarry stools; red or dark brown urine; spitting up blood or brown material that looks like coffee grounds; red spots on the skin; unusual bruising or bleeding from the eyes, gums, or nose)  blood clot (chest pain; shortness of breath; pain, swelling, or warmth in the leg)  stroke (changes in vision; confusion; trouble speaking or understanding; severe headaches; sudden numbness or weakness of the face, arm or leg; trouble walking; dizziness; loss of balance or coordination) Side effects that usually do not require medical attention (report to your doctor or health care professional if they continue or are bothersome):  diarrhea  dizziness  headache  joint pain  muscle pain  stomach pain  trouble sleeping This list may not describe all possible side effects. Call your doctor for medical advice about side effects. You may report side effects to FDA at 1-800-FDA-1088. Where should I keep my medicine? This medicine is given in a hospital or clinic. It will not be stored at home. NOTE: This sheet is a summary. It may not cover all possible   information. If you have questions about this medicine, talk to your doctor, pharmacist, or health care provider.  2021 Elsevier/Gold Standard (2019-10-11 10:28:13)  

## 2021-01-15 ENCOUNTER — Inpatient Hospital Stay: Payer: Medicaid Other

## 2021-01-15 ENCOUNTER — Other Ambulatory Visit: Payer: Self-pay | Admitting: Hematology and Oncology

## 2021-01-15 ENCOUNTER — Encounter: Payer: Self-pay | Admitting: Hematology and Oncology

## 2021-01-15 DIAGNOSIS — D696 Thrombocytopenia, unspecified: Secondary | ICD-10-CM

## 2021-01-15 LAB — CBC AND DIFFERENTIAL
HCT: 44 (ref 36–46)
Hemoglobin: 14.6 (ref 12.0–16.0)
Neutrophils Absolute: 1.66
Platelets: 63 — AB (ref 150–399)
WBC: 2.4

## 2021-01-15 LAB — CBC: RBC: 4.66 (ref 3.87–5.11)

## 2021-01-17 ENCOUNTER — Other Ambulatory Visit: Payer: Self-pay

## 2021-01-17 ENCOUNTER — Inpatient Hospital Stay: Payer: Medicaid Other

## 2021-01-17 VITALS — BP 125/86 | HR 95 | Temp 98.0°F | Resp 18 | Ht 59.0 in | Wt 139.2 lb

## 2021-01-17 DIAGNOSIS — D693 Immune thrombocytopenic purpura: Secondary | ICD-10-CM | POA: Diagnosis not present

## 2021-01-17 DIAGNOSIS — D696 Thrombocytopenia, unspecified: Secondary | ICD-10-CM

## 2021-01-17 MED ORDER — ROMIPLOSTIM 250 MCG ~~LOC~~ SOLR
3.0000 ug/kg | Freq: Once | SUBCUTANEOUS | Status: AC
Start: 2021-01-17 — End: 2021-01-17
  Administered 2021-01-17: 190 ug via SUBCUTANEOUS
  Filled 2021-01-17: qty 0.38

## 2021-01-17 NOTE — Patient Instructions (Signed)
Romiplostim injection What is this medicine? ROMIPLOSTIM (roe mi PLOE stim) helps your body make more platelets. This medicine is used to treat low platelets caused by chronic idiopathic thrombocytopenic purpura (ITP) or a bone marrow syndrome caused by radiation sickness. This medicine may be used for other purposes; ask your health care provider or pharmacist if you have questions. COMMON BRAND NAME(S): Nplate What should I tell my health care provider before I take this medicine? They need to know if you have any of these conditions:  blood clots  myelodysplastic syndrome  an unusual or allergic reaction to romiplostim, mannitol, other medicines, foods, dyes, or preservatives  pregnant or trying to get pregnant  breast-feeding How should I use this medicine? This medicine is injected under the skin. It is given by a health care provider in a hospital or clinic setting. A special MedGuide will be given to you before each treatment. Be sure to read this information carefully each time. Talk to your health care provider about the use of this medicine in children. While it may be prescribed for children as young as newborns for selected conditions, precautions do apply. Overdosage: If you think you have taken too much of this medicine contact a poison control center or emergency room at once. NOTE: This medicine is only for you. Do not share this medicine with others. What if I miss a dose? Keep appointments for follow-up doses. It is important not to miss your dose. Call your health care provider if you are unable to keep an appointment. What may interact with this medicine? Interactions are not expected. This list may not describe all possible interactions. Give your health care provider a list of all the medicines, herbs, non-prescription drugs, or dietary supplements you use. Also tell them if you smoke, drink alcohol, or use illegal drugs. Some items may interact with your  medicine. What should I watch for while using this medicine? Visit your health care provider for regular checks on your progress. You may need blood work done while you are taking this medicine. Your condition will be monitored carefully while you are receiving this medicine. It is important not to miss any appointments. What side effects may I notice from receiving this medicine? Side effects that you should report to your doctor or health care professional as soon as possible:  allergic reactions (skin rash, itching or hives; swelling of the face, lips, or tongue)  bleeding (bloody or black, tarry stools; red or dark brown urine; spitting up blood or brown material that looks like coffee grounds; red spots on the skin; unusual bruising or bleeding from the eyes, gums, or nose)  blood clot (chest pain; shortness of breath; pain, swelling, or warmth in the leg)  stroke (changes in vision; confusion; trouble speaking or understanding; severe headaches; sudden numbness or weakness of the face, arm or leg; trouble walking; dizziness; loss of balance or coordination) Side effects that usually do not require medical attention (report to your doctor or health care professional if they continue or are bothersome):  diarrhea  dizziness  headache  joint pain  muscle pain  stomach pain  trouble sleeping This list may not describe all possible side effects. Call your doctor for medical advice about side effects. You may report side effects to FDA at 1-800-FDA-1088. Where should I keep my medicine? This medicine is given in a hospital or clinic. It will not be stored at home. NOTE: This sheet is a summary. It may not cover all possible   information. If you have questions about this medicine, talk to your doctor, pharmacist, or health care provider.  2021 Elsevier/Gold Standard (2019-10-11 10:28:13)  

## 2021-01-17 NOTE — Progress Notes (Signed)
1255: PT STABLE AT TIME OF DISCHARGE  

## 2021-01-18 ENCOUNTER — Other Ambulatory Visit (HOSPITAL_COMMUNITY): Payer: Self-pay

## 2021-01-22 ENCOUNTER — Encounter: Payer: Self-pay | Admitting: Nurse Practitioner

## 2021-01-22 ENCOUNTER — Inpatient Hospital Stay: Payer: Medicaid Other

## 2021-01-22 ENCOUNTER — Ambulatory Visit: Payer: Self-pay | Admitting: Family Medicine

## 2021-01-23 ENCOUNTER — Telehealth: Payer: Self-pay | Admitting: Oncology

## 2021-01-23 NOTE — Telephone Encounter (Signed)
Patient called yesterday to Cancel her Lab Appt due to being sick.  Patient's Appt for 5/18 has been adjusted to Labs 1:15 pm - [place] 3:15 pm at [facility] Location ([facility])  Evelena Peat is aware of the situation to work out [employer]

## 2021-01-24 ENCOUNTER — Other Ambulatory Visit: Payer: Self-pay

## 2021-01-24 ENCOUNTER — Other Ambulatory Visit: Payer: Self-pay | Admitting: Hematology and Oncology

## 2021-01-24 ENCOUNTER — Inpatient Hospital Stay: Payer: Medicaid Other

## 2021-01-24 VITALS — BP 111/90 | HR 98 | Temp 98.3°F | Resp 18 | Ht 59.0 in | Wt 144.2 lb

## 2021-01-24 DIAGNOSIS — D696 Thrombocytopenia, unspecified: Secondary | ICD-10-CM

## 2021-01-24 DIAGNOSIS — D693 Immune thrombocytopenic purpura: Secondary | ICD-10-CM | POA: Diagnosis not present

## 2021-01-24 LAB — CBC AND DIFFERENTIAL
HCT: 42 (ref 36–46)
Hemoglobin: 13.8 (ref 12.0–16.0)
Neutrophils Absolute: 1.98
Platelets: 101 — AB (ref 150–399)
WBC: 3

## 2021-01-24 LAB — CBC: RBC: 4.44 (ref 3.87–5.11)

## 2021-01-24 MED ORDER — ROMIPLOSTIM 250 MCG ~~LOC~~ SOLR
3.0000 ug/kg | Freq: Once | SUBCUTANEOUS | Status: AC
Start: 2021-01-24 — End: 2021-01-24
  Administered 2021-01-24: 195 ug via SUBCUTANEOUS
  Filled 2021-01-24: qty 0.39

## 2021-01-24 NOTE — Progress Notes (Signed)
Spoke with [facility] and scheduled patient for  DOS 05/23, 05/25, 05/31, 06/01, 06/06, 06/08, 06/13, and 06/15.

## 2021-01-24 NOTE — Patient Instructions (Signed)
Romiplostim injection What is this medicine? ROMIPLOSTIM (roe mi PLOE stim) helps your body make more platelets. This medicine is used to treat low platelets caused by chronic idiopathic thrombocytopenic purpura (ITP) or a bone marrow syndrome caused by radiation sickness. This medicine may be used for other purposes; ask your health care provider or pharmacist if you have questions. COMMON BRAND NAME(S): Nplate What should I tell my health care provider before I take this medicine? They need to know if you have any of these conditions:  blood clots  myelodysplastic syndrome  an unusual or allergic reaction to romiplostim, mannitol, other medicines, foods, dyes, or preservatives  pregnant or trying to get pregnant  breast-feeding How should I use this medicine? This medicine is injected under the skin. It is given by a health care provider in a hospital or clinic setting. A special MedGuide will be given to you before each treatment. Be sure to read this information carefully each time. Talk to your health care provider about the use of this medicine in children. While it may be prescribed for children as young as newborns for selected conditions, precautions do apply. Overdosage: If you think you have taken too much of this medicine contact a poison control center or emergency room at once. NOTE: This medicine is only for you. Do not share this medicine with others. What if I miss a dose? Keep appointments for follow-up doses. It is important not to miss your dose. Call your health care provider if you are unable to keep an appointment. What may interact with this medicine? Interactions are not expected. This list may not describe all possible interactions. Give your health care provider a list of all the medicines, herbs, non-prescription drugs, or dietary supplements you use. Also tell them if you smoke, drink alcohol, or use illegal drugs. Some items may interact with your  medicine. What should I watch for while using this medicine? Visit your health care provider for regular checks on your progress. You may need blood work done while you are taking this medicine. Your condition will be monitored carefully while you are receiving this medicine. It is important not to miss any appointments. What side effects may I notice from receiving this medicine? Side effects that you should report to your doctor or health care professional as soon as possible:  allergic reactions (skin rash, itching or hives; swelling of the face, lips, or tongue)  bleeding (bloody or black, tarry stools; red or dark brown urine; spitting up blood or brown material that looks like coffee grounds; red spots on the skin; unusual bruising or bleeding from the eyes, gums, or nose)  blood clot (chest pain; shortness of breath; pain, swelling, or warmth in the leg)  stroke (changes in vision; confusion; trouble speaking or understanding; severe headaches; sudden numbness or weakness of the face, arm or leg; trouble walking; dizziness; loss of balance or coordination) Side effects that usually do not require medical attention (report to your doctor or health care professional if they continue or are bothersome):  diarrhea  dizziness  headache  joint pain  muscle pain  stomach pain  trouble sleeping This list may not describe all possible side effects. Call your doctor for medical advice about side effects. You may report side effects to FDA at 1-800-FDA-1088. Where should I keep my medicine? This medicine is given in a hospital or clinic. It will not be stored at home. NOTE: This sheet is a summary. It may not cover all possible   information. If you have questions about this medicine, talk to your doctor, pharmacist, or health care provider.  2021 Elsevier/Gold Standard (2019-10-11 10:28:13)  

## 2021-01-29 ENCOUNTER — Other Ambulatory Visit: Payer: Self-pay | Admitting: Hematology and Oncology

## 2021-01-29 ENCOUNTER — Inpatient Hospital Stay: Payer: Medicaid Other

## 2021-01-29 ENCOUNTER — Encounter: Payer: Self-pay | Admitting: Hematology and Oncology

## 2021-01-29 DIAGNOSIS — D696 Thrombocytopenia, unspecified: Secondary | ICD-10-CM

## 2021-01-29 LAB — CBC AND DIFFERENTIAL
HCT: 48 — AB (ref 36–46)
Hemoglobin: 15.4 (ref 12.0–16.0)
Neutrophils Absolute: 3.18
Platelets: 124 — AB (ref 150–399)
WBC: 4.3

## 2021-01-29 LAB — CBC: RBC: 5.04 (ref 3.87–5.11)

## 2021-01-30 ENCOUNTER — Encounter: Payer: Self-pay | Admitting: Neurology

## 2021-01-30 ENCOUNTER — Other Ambulatory Visit (HOSPITAL_COMMUNITY): Payer: Self-pay

## 2021-01-30 ENCOUNTER — Ambulatory Visit: Payer: Medicaid Other | Admitting: Neurology

## 2021-01-30 ENCOUNTER — Other Ambulatory Visit: Payer: Self-pay

## 2021-01-30 VITALS — BP 116/76 | HR 99 | Ht 60.0 in | Wt 143.2 lb

## 2021-01-30 DIAGNOSIS — F419 Anxiety disorder, unspecified: Secondary | ICD-10-CM

## 2021-01-30 DIAGNOSIS — G40309 Generalized idiopathic epilepsy and epileptic syndromes, not intractable, without status epilepticus: Secondary | ICD-10-CM | POA: Diagnosis not present

## 2021-01-30 MED ORDER — LEVETIRACETAM 750 MG PO TABS
750.0000 mg | ORAL_TABLET | Freq: Two times a day (BID) | ORAL | 11 refills | Status: DC
  Filled 2021-01-30: qty 60, 30d supply, fill #0
  Filled 2021-04-10: qty 60, 30d supply, fill #1

## 2021-01-30 MED ORDER — ARIPIPRAZOLE 10 MG PO TABS
ORAL_TABLET | Freq: Every day | ORAL | 1 refills | Status: DC
  Filled 2021-01-30: qty 30, 30d supply, fill #0
  Filled 2021-03-05 (×2): qty 30, 30d supply, fill #1

## 2021-01-30 MED ORDER — FLUOXETINE HCL 40 MG PO CAPS
ORAL_CAPSULE | Freq: Every day | ORAL | 1 refills | Status: DC
  Filled 2021-01-30: qty 30, 30d supply, fill #0
  Filled 2021-03-05: qty 30, 30d supply, fill #1

## 2021-01-30 NOTE — Progress Notes (Signed)
NEUROLOGY FOLLOW UP OFFICE NOTE  Briana Sweeney [id number] Jan 03, 1971  HISTORY OF PRESENT ILLNESS: I had the pleasure of seeing Briana Sweeney in follow-up in the neurology clinic on 01/30/2021.  The patient was last seen 7 months ago for epilepsy suggestive of primary generalized epilepsy.  She is alone in the office today. Records and images were personally reviewed where available.  Since her last visit, she was in the ER in March 2022 for alcohol intoxication, EtOH level was 304. UDS positive for amphetamines and benzodiazepines. Head CT no acute changes. She states her last alcohol intake was at that time (2 months ago). She has been told by her doctors that if she continues drinking, she will not make it to the end of the year, so she is trying to remain sober. She thinks she had a silent seizure in the [place] on the way here. She has been really stressed out and feels she has been having them for the past week since she ran out of clonazepam. She reports a seizure last month where she was brought to [facility], records unavailable for review. She denies missing doses of Levetiracetam 500mg  BID. She states her PCP and psychiatrist have quit so she has not been able to take her clonazepam, Prozac, and Abilify for the past week. She is "sort of sleeping" with the mirtazapine. She gets platelet infusions regularly. She denies any headaches, dizziness. She has neck and back pain. She lives alone and does not drive.    History on Initial Assessment 11/09/2019: This is a pleasant 50 year old right-handed woman with a history of cirrhosis secondary to alcohol abuse and hepatitis C, schizophrenia, anxiety, presenting for evaluation of seizures. She has not seen a neurologist in many years, her last neurologist was in [place]. Seizures started around age 9 or 66 where she would have "silent seizures." She would start feeling nervous with hot/cold flashes. Her eyes would just go blind and dilated after,  she would feel weird for 2 days. She has had a couple of convulsions in the past 5 years, last was in July 2020. She lives alone and friends have mentioned staring spells. She has occasional twitching and may drop something in her hand. She used to have nocturnal seizures and wake up with a bloody pillow. She had been on Phenytoin for many years but continued to have seizures once a month. Phenytoin would interact with hepatitis C medication, so she was switched to Levetiracetam 500mg  BID last October  2020. She denies any seizures since starting LEV. She denies any olfactory/gustatory hallucinations, rising epigastric sensation, focal numbness/tingling/weakness. She used to have headaches but these have decreased. She gets dizzy when her platelet count goes low. She has occasional neck/back pain, occasional numbness in both hands. No diplopia, dysarthria/dysphagia, bowel/bladder dysfunction. Her right foot is in a boot s/p bunion removal. 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 She recalls a fall 4 months ago when her platelets went down and she fell forward, hitting her face. She states she quit drinking 4 months ago. Of note, there is an ER visit on 11/01/19 where she reported last drink was day prior. She states her memory is "half/half." Sleep is good.   Epilepsy Risk Factors:  She had febrile seizures in childhood. Otherwise she had a normal birth and early development.  There is no history of CNS infections such as meningitis/encephalitis, significant traumatic brain injury, neurosurgical  procedures, or family history of seizures.  Diagnostic Data: MRI brain without contrast done 03/2019 no acute changes, there was brain atrophy, premature for age, mild T1 hyperintensity in the basal ganglia likely related to chronic alcohol use.  Prior AEDs: Phenytoin   PAST MEDICAL HISTORY: Past Medical History:  Diagnosis Date  . Alcohol use   . Anxiety    . Bipolar affective disorder ([place])    With anxiety features  . Bunionette of right foot 11/2019  . Cirrhosis of liver ([place])    Due to alcohol and hepatitis C  . Depression   . 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 ETOHism ([place])   . GERD (gastroesophageal reflux disease)   . Hematemesis 02/10/2018  . Hepatitis C 2018   hepatitis c and alcohol related hepatitis  . Heroin abuse ([place])   . History of blood transfusion    "blood doesn't clot; I fell down and had to have a transfusion"  . History of kidney stones   . History of thrombocytopenia   . Menopause 2016  . Migraine    "when I get really stressed" (09/01/2017)  . S/P foot surgery, right 11/2019  . Schizophrenia ([place])   . Seizures ([place])    "when I run out of my RX; lots recently" (09/01/2017)  . Thrombocytopenia ([place])   . Urinary urgency 05/2020    MEDICATIONS: Current Outpatient Medications on File Prior to Visit  Medication Sig Dispense Refill  . ARIPiprazole (ABILIFY) 10 MG tablet TAKE 1 TABLET BY MOUTH DAILY 30 tablet 5  . clonazePAM (KLONOPIN) 2 MG tablet TAKE 1 TABLET BY MOUTH 2 TIMES DAILY AS NEEDED FOR ANXIETY 60 tablet 2  . diclofenac Sodium (VOLTAREN) 1 % GEL Apply 4 g topically 4 (four) times daily. 100 g 2  . famotidine (PEPCID) 20 MG tablet TAKE 1 TABLET BY MOUTH DAILY 90 tablet 1  . FLUoxetine (PROZAC) 40 MG capsule TAKE 1 CAPSULE BY MOUTH DAILY 30 capsule 5  . furosemide (LASIX) 40 MG tablet TAKE 1 TABLET BY MOUTH DAILY. 90 tablet 0  . gabapentin (NEURONTIN) 300 MG capsule TAKE 2 CAPSULES BY MOUTH 3 TIMES DAILY. NEED OFFICE VISIT FOR MORE REFILLS. 90 capsule 0  . lactulose (CHRONULAC) 10 GM/15ML solution Take 30 mLs (20 g total) by mouth 3 (three) times daily. Goal to have 2-3 BMs daily 240 mL 3  . levETIRAcetam (KEPPRA) 500 MG tablet TAKE 1 TABLET BY MOUTH 2 TIMES DAILY 180 tablet 3  . mirtazapine (REMERON) 30 MG tablet TAKE 1 TABLET BY MOUTH AT BEDTIME 30 tablet 5  . Multiple Vitamin (MULTIVITAMIN WITH  MINERALS) TABS tablet Take 1 tablet by mouth daily.    . nitrofurantoin, macrocrystal-monohydrate, (MACROBID) 100 MG capsule TAKE 1 CAPSULE BY MOUTH TWICE DAILY 14 capsule 0  . oxyCODONE (OXY IR/ROXICODONE) 5 MG immediate release tablet TAKE 1 TABLET (5 MG TOTAL) BY MOUTH EVERY 6 (SIX) HOURS AS NEEDED FOR SEVERE PAIN. 8 tablet 0  . polyethylene glycol (MIRALAX / GLYCOLAX) 17 g packet Take 17 g by mouth 2 (two) times daily. (Patient taking differently: Take 17 g by mouth daily as needed for mild constipation.) 14 each 0   No current facility-administered medications on file prior to visit.    ALLERGIES: Allergies  Allergen Reactions  . Other Other (See Comments)    Platelets: Rx chest pain, tremors, body aches    FAMILY HISTORY: Family History  Problem Relation Age of Onset  . Lung cancer Mother 82  .  Alcohol abuse Mother   . Throat cancer Father 53    SOCIAL HISTORY: Social History   Socioeconomic History  . Marital status: Divorced    Spouse name: Not on file  . Number of children: 2  . Years of education: Not on file  . Highest education level: Not on file  Occupational History  . Occupation: applying for disability  Tobacco Use  . Smoking status: Never Smoker  . Smokeless tobacco: Never Used  Vaping Use  . Vaping Use: Never used  Substance and Sexual Activity  . Alcohol use: Not Currently    Alcohol/week: 6.0 standard drinks    Types: 6 Cans of beer per week    Comment: weekly  . Drug use: Not Currently  . Sexual activity: Not on file  Other Topics Concern  . Not on file  Social History Narrative   She moved with a boyfriend to [place] and was followed at [facility].  He died of a massive heart attack in 8/18, per her report, and so she moved back to [place] and is living with a friend.      Right handed   2 story home    Lives alone 11/09/19   Social Determinants of Health   Financial Resource Strain: Not on file  Food Insecurity: Not on file   Transportation Needs: Not on file  Physical Activity: Not on file  Stress: Not on file  Social Connections: Not on file  Intimate Partner Violence: Not on file     PHYSICAL EXAM: Vitals:   01/30/21 1140  BP: 116/76  Pulse: 99  SpO2: 97%   General: No acute distress Head:  Normocephalic/atraumatic Skin/Extremities: No rash, no edema Neurological Exam: alert and alert. No aphasia or dysarthria. Fund of knowledge is appropriate. Attention and concentration are normal.   Cranial nerves: Pupils equal, round. Extraocular movements intact with no nystagmus. Visual fields full.  No facial asymmetry.  Motor: Bulk and tone normal, muscle strength 5/5 throughout with no pronator drift.   Finger to nose testing intact.  Gait slow and cautious reporting neck/back pain. No ataxia.   IMPRESSION: This is a 50 yo RH woman with a history of cirrhosis secondary to alcohol abuse and hepatitis C, schizophrenia, anxiety, and seizures since childhood with staring spells and GTCs suggestive of primary generalized epilepsy. She reports an increase in seizures recently, denies alcohol intake in the past 2 months. Proceed with 1-hour EEG as previously discussed to classify seizures. Increase Levetiracetam to 750mg  BID. She is asking if I can refill her Prozac 40mg  daily, Abilify 10mg  daily, and clonazepam. Discussed with her that I cannot prescribe clonazepam for her, Behavioral Health resources provided. I can give her a refill for Prozac and Abilify until she establishes care with her PCP and psychiatrist. She does not drive. She was encouraged to continue with alcohol cessation. Follow-up in 5-6 months, call for any changes.   Thank you for allowing me to participate in her care.  Please do not hesitate to call for any questions or concerns.   Ellouise Newer, M.D.   CC: Kathe Becton, FNP

## 2021-01-30 NOTE — Patient Instructions (Signed)
1. Schedule 1-hour EEG  2. Increase Keppra to 750mg  twice a day, a new prescription has been sent to your pharmacy  3. A prescription for Prozac and Abilify have been sent, further refills should come from your psychiatrist or PCP  4. Recommend looking into [facility] for continue management.  You can schedule a telehealth appointment by contacting their [place] at [phone]  Or visit website: [email or web address]  5. Continue with alcohol cessation  6. Follow-up in 5-6 months, call for any changes   Seizure Precautions: 1. If medication has been prescribed for you to prevent seizures, take it exactly as directed.  Do not stop taking the medicine without talking to your doctor first, even if you have not had a seizure in a long time.   2. Avoid activities in which a seizure would cause danger to yourself or to others.  Don't operate dangerous machinery, swim alone, or climb in high or dangerous places, such as on ladders, roofs, or girders.  Do not drive unless your doctor says you may.  3. If you have any warning that you may have a seizure, lay down in a safe place where you can't hurt yourself.    4.  No driving for 6 months from last seizure, as per [facility].   Please refer to the following link on the [place] website for more information: http://[email or web address]/[email or web address]/drivingu.cfm   5.  Maintain good sleep hygiene. Avoid alcohol.  6.  Notify your neurology if you are planning pregnancy or if you become pregnant.  7.  Contact your doctor if you have any problems that may be related to the medicine you are taking.  8.  Call 911 and bring the patient back to the ED if:        A.  The seizure lasts longer than 5 minutes.       B.  The patient doesn't awaken shortly after the seizure  C.  The patient has new problems such as difficulty seeing, speaking or  moving  D.  The patient was injured during the seizure  E.  The patient has a temperature over 102 F (39C)  F.  The patient vomited and now is having trouble breathing

## 2021-01-31 ENCOUNTER — Inpatient Hospital Stay: Payer: Medicaid Other

## 2021-01-31 VITALS — BP 97/66 | HR 91 | Temp 98.2°F | Resp 18 | Ht 60.0 in | Wt 143.0 lb

## 2021-01-31 DIAGNOSIS — D693 Immune thrombocytopenic purpura: Secondary | ICD-10-CM | POA: Diagnosis not present

## 2021-01-31 DIAGNOSIS — D696 Thrombocytopenia, unspecified: Secondary | ICD-10-CM

## 2021-01-31 MED ORDER — ROMIPLOSTIM 250 MCG ~~LOC~~ SOLR
3.0000 ug/kg | Freq: Once | SUBCUTANEOUS | Status: AC
  Administered 2021-01-31: 195 ug via SUBCUTANEOUS
  Filled 2021-01-31: qty 0.39

## 2021-01-31 NOTE — Patient Instructions (Signed)
Romiplostim injection What is this medicine? ROMIPLOSTIM (roe mi PLOE stim) helps your body make more platelets. This medicine is used to treat low platelets caused by chronic idiopathic thrombocytopenic purpura (ITP) or a bone marrow syndrome caused by radiation sickness. This medicine may be used for other purposes; ask your health care provider or pharmacist if you have questions. COMMON BRAND NAME(S): Nplate What should I tell my health care provider before I take this medicine? They need to know if you have any of these conditions:  blood clots  myelodysplastic syndrome  an unusual or allergic reaction to romiplostim, mannitol, other medicines, foods, dyes, or preservatives  pregnant or trying to get pregnant  breast-feeding How should I use this medicine? This medicine is injected under the skin. It is given by a health care provider in a hospital or clinic setting. A special MedGuide will be given to you before each treatment. Be sure to read this information carefully each time. Talk to your health care provider about the use of this medicine in children. While it may be prescribed for children as young as newborns for selected conditions, precautions do apply. Overdosage: If you think you have taken too much of this medicine contact a poison control center or emergency room at once. NOTE: This medicine is only for you. Do not share this medicine with others. What if I miss a dose? Keep appointments for follow-up doses. It is important not to miss your dose. Call your health care provider if you are unable to keep an appointment. What may interact with this medicine? Interactions are not expected. This list may not describe all possible interactions. Give your health care provider a list of all the medicines, herbs, non-prescription drugs, or dietary supplements you use. Also tell them if you smoke, drink alcohol, or use illegal drugs. Some items may interact with your  medicine. What should I watch for while using this medicine? Visit your health care provider for regular checks on your progress. You may need blood work done while you are taking this medicine. Your condition will be monitored carefully while you are receiving this medicine. It is important not to miss any appointments. What side effects may I notice from receiving this medicine? Side effects that you should report to your doctor or health care professional as soon as possible:  allergic reactions (skin rash, itching or hives; swelling of the face, lips, or tongue)  bleeding (bloody or black, tarry stools; red or dark brown urine; spitting up blood or brown material that looks like coffee grounds; red spots on the skin; unusual bruising or bleeding from the eyes, gums, or nose)  blood clot (chest pain; shortness of breath; pain, swelling, or warmth in the leg)  stroke (changes in vision; confusion; trouble speaking or understanding; severe headaches; sudden numbness or weakness of the face, arm or leg; trouble walking; dizziness; loss of balance or coordination) Side effects that usually do not require medical attention (report to your doctor or health care professional if they continue or are bothersome):  diarrhea  dizziness  headache  joint pain  muscle pain  stomach pain  trouble sleeping This list may not describe all possible side effects. Call your doctor for medical advice about side effects. You may report side effects to FDA at 1-800-FDA-1088. Where should I keep my medicine? This medicine is given in a hospital or clinic. It will not be stored at home. NOTE: This sheet is a summary. It may not cover all possible   information. If you have questions about this medicine, talk to your doctor, pharmacist, or health care provider.  2021 Elsevier/Gold Standard (2019-10-11 10:28:13)  

## 2021-02-01 ENCOUNTER — Other Ambulatory Visit (HOSPITAL_COMMUNITY): Payer: Self-pay

## 2021-02-01 ENCOUNTER — Other Ambulatory Visit: Payer: Self-pay

## 2021-02-01 ENCOUNTER — Ambulatory Visit (INDEPENDENT_AMBULATORY_CARE_PROVIDER_SITE_OTHER): Payer: Medicaid Other | Admitting: Neurology

## 2021-02-01 DIAGNOSIS — G40309 Generalized idiopathic epilepsy and epileptic syndromes, not intractable, without status epilepticus: Secondary | ICD-10-CM

## 2021-02-01 DIAGNOSIS — F419 Anxiety disorder, unspecified: Secondary | ICD-10-CM

## 2021-02-04 ENCOUNTER — Other Ambulatory Visit: Payer: Self-pay | Admitting: Family Medicine

## 2021-02-04 DIAGNOSIS — Z9889 Other specified postprocedural states: Secondary | ICD-10-CM

## 2021-02-04 DIAGNOSIS — M79671 Pain in right foot: Secondary | ICD-10-CM

## 2021-02-04 DIAGNOSIS — M21621 Bunionette of right foot: Secondary | ICD-10-CM

## 2021-02-06 ENCOUNTER — Inpatient Hospital Stay: Payer: Medicaid Other

## 2021-02-06 ENCOUNTER — Encounter: Payer: Self-pay | Admitting: Hematology and Oncology

## 2021-02-06 ENCOUNTER — Other Ambulatory Visit (HOSPITAL_COMMUNITY): Payer: Self-pay

## 2021-02-06 ENCOUNTER — Other Ambulatory Visit: Payer: Self-pay

## 2021-02-06 DIAGNOSIS — D696 Thrombocytopenia, unspecified: Secondary | ICD-10-CM

## 2021-02-06 LAB — CBC AND DIFFERENTIAL
HCT: 46 (ref 36–46)
Hemoglobin: 15.4 (ref 12.0–16.0)
Neutrophils Absolute: 1.98
Platelets: 111 — AB (ref 150–399)
WBC: 3.2

## 2021-02-06 LAB — CBC: RBC: 4.95 (ref 3.87–5.11)

## 2021-02-06 MED ORDER — GABAPENTIN 300 MG PO CAPS
ORAL_CAPSULE | ORAL | 0 refills | Status: DC
  Filled 2021-02-06 – 2021-02-15 (×2): qty 90, 15d supply, fill #0
  Filled 2021-02-28: qty 90, 30d supply, fill #0

## 2021-02-07 ENCOUNTER — Other Ambulatory Visit (HOSPITAL_COMMUNITY): Payer: Self-pay

## 2021-02-07 ENCOUNTER — Telehealth (HOSPITAL_COMMUNITY): Payer: Self-pay | Admitting: *Deleted

## 2021-02-07 ENCOUNTER — Other Ambulatory Visit: Payer: Self-pay

## 2021-02-07 ENCOUNTER — Inpatient Hospital Stay: Payer: Medicaid Other | Attending: Hematology & Oncology

## 2021-02-07 VITALS — BP 110/82 | HR 98 | Temp 98.2°F | Resp 18 | Ht 60.0 in | Wt 142.5 lb

## 2021-02-07 DIAGNOSIS — D696 Thrombocytopenia, unspecified: Secondary | ICD-10-CM

## 2021-02-07 DIAGNOSIS — D693 Immune thrombocytopenic purpura: Secondary | ICD-10-CM | POA: Diagnosis not present

## 2021-02-07 MED ORDER — ROMIPLOSTIM 250 MCG ~~LOC~~ SOLR
3.0000 ug/kg | Freq: Once | SUBCUTANEOUS | Status: AC
  Administered 2021-02-07: 195 ug via SUBCUTANEOUS
  Filled 2021-02-07: qty 0.39

## 2021-02-07 NOTE — Telephone Encounter (Signed)
Received fax from [facility] requesting refill for the Clonazepam 2 mg bid prn. Former pt of Dr. Montel Culver. Please review and advise. Thanks.

## 2021-02-07 NOTE — Patient Instructions (Signed)
Romiplostim injection What is this medicine? ROMIPLOSTIM (roe mi PLOE stim) helps your body make more platelets. This medicine is used to treat low platelets caused by chronic idiopathic thrombocytopenic purpura (ITP) or a bone marrow syndrome caused by radiation sickness. This medicine may be used for other purposes; ask your health care provider or pharmacist if you have questions. COMMON BRAND NAME(S): Nplate What should I tell my health care provider before I take this medicine? They need to know if you have any of these conditions:  blood clots  myelodysplastic syndrome  an unusual or allergic reaction to romiplostim, mannitol, other medicines, foods, dyes, or preservatives  pregnant or trying to get pregnant  breast-feeding How should I use this medicine? This medicine is injected under the skin. It is given by a health care provider in a hospital or clinic setting. A special MedGuide will be given to you before each treatment. Be sure to read this information carefully each time. Talk to your health care provider about the use of this medicine in children. While it may be prescribed for children as young as newborns for selected conditions, precautions do apply. Overdosage: If you think you have taken too much of this medicine contact a poison control center or emergency room at once. NOTE: This medicine is only for you. Do not share this medicine with others. What if I miss a dose? Keep appointments for follow-up doses. It is important not to miss your dose. Call your health care provider if you are unable to keep an appointment. What may interact with this medicine? Interactions are not expected. This list may not describe all possible interactions. Give your health care provider a list of all the medicines, herbs, non-prescription drugs, or dietary supplements you use. Also tell them if you smoke, drink alcohol, or use illegal drugs. Some items may interact with your  medicine. What should I watch for while using this medicine? Visit your health care provider for regular checks on your progress. You may need blood work done while you are taking this medicine. Your condition will be monitored carefully while you are receiving this medicine. It is important not to miss any appointments. What side effects may I notice from receiving this medicine? Side effects that you should report to your doctor or health care professional as soon as possible:  allergic reactions (skin rash, itching or hives; swelling of the face, lips, or tongue)  bleeding (bloody or black, tarry stools; red or dark brown urine; spitting up blood or brown material that looks like coffee grounds; red spots on the skin; unusual bruising or bleeding from the eyes, gums, or nose)  blood clot (chest pain; shortness of breath; pain, swelling, or warmth in the leg)  stroke (changes in vision; confusion; trouble speaking or understanding; severe headaches; sudden numbness or weakness of the face, arm or leg; trouble walking; dizziness; loss of balance or coordination) Side effects that usually do not require medical attention (report to your doctor or health care professional if they continue or are bothersome):  diarrhea  dizziness  headache  joint pain  muscle pain  stomach pain  trouble sleeping This list may not describe all possible side effects. Call your doctor for medical advice about side effects. You may report side effects to FDA at 1-800-FDA-1088. Where should I keep my medicine? This medicine is given in a hospital or clinic. It will not be stored at home. NOTE: This sheet is a summary. It may not cover all possible   information. If you have questions about this medicine, talk to your doctor, pharmacist, or health care provider.  2021 Elsevier/Gold Standard (2019-10-11 10:28:13)  

## 2021-02-07 NOTE — Procedures (Signed)
ELECTROENCEPHALOGRAM REPORT  Date of Study: 02/01/2021  Patient's Name: Briana Sweeney MRN: [id number] Date of Birth: Apr 28, 1971  Referring Provider: Dr. Ellouise Newer  Clinical History: This is a 50 year old woman with staring spells and convulsions. EEG for classification.   Medications: KEPPRA 750 MG tablet ABILIFY 10 MG tablet KLONOPIN 2 MG tablet VOLTAREN 1 % GEL PEPCID 20 MG tablet PROZAC 40 MG capsule LASIX 40 MG tablet NEURONTIN 300 MG capsule CHRONULAC 10 GM/15ML solution REMERON 30 MG tablet MULTIVITAMIN WITH MINERALS TABS tablet OXY IR/ROXICODONE 5 MG immediate release tablet MIRALAX / GLYCOLAX 17 g packet   Technical Summary: A multichannel digital 1-hour EEG recording measured by the international 10-20 system with electrodes applied with paste and impedances below 5000 ohms performed in our laboratory with EKG monitoring in an awake and asleep patient.  Hyperventilation was not performed. Photic stimulation was performed.  The digital EEG was referentially recorded, reformatted, and digitally filtered in a variety of bipolar and referential montages for optimal display.    Description: The patient is awake and asleep during the recording.  During maximal wakefulness, there is a symmetric, medium voltage 10 Hz posterior dominant rhythm that attenuates with eye opening.  The record is symmetric.  During drowsiness and sleep, there is an increase in theta slowing of the background.  Vertex waves and symmetric sleep spindles were seen.  Photic stimulation did not elicit any abnormalities.  There were no epileptiform discharges or electrographic seizures seen.    EKG lead was unremarkable.  Impression: This 1-hour awake and asleep EEG is normal.    Clinical Correlation: A normal EEG does not exclude a clinical diagnosis of epilepsy.  If further clinical questions remain, prolonged EEG may be helpful.  Clinical correlation is advised.   Ellouise Newer, M.D.

## 2021-02-08 NOTE — Telephone Encounter (Signed)
This is Dr Einar Grad last note from 01/14/21. She was recommended to established care with new provider. Did we send the letter?  "On checking patient's medication refills, she has obtained her refills on the clonazepam, monthly (taking 4mg  daily), but has not filled the mirtazapine, Abilify and has only filled the fluoxetine once since early March. Patient appears to be dependant/abusing the Lorazepam, not willing to taper or consider other options, appears to be non compliant with her other medications.  If patient calls for clonazepam refills, covering physician should be aware of the abuse of the medication and taper patient on the clonazepam.  Since this clinician does not prescribe benzodiazepines on long term basis, patient will be given choice of providers in the community to follow up with."

## 2021-02-09 ENCOUNTER — Other Ambulatory Visit (HOSPITAL_COMMUNITY): Payer: Self-pay

## 2021-02-09 ENCOUNTER — Other Ambulatory Visit (HOSPITAL_COMMUNITY): Payer: Self-pay | Admitting: Family Medicine

## 2021-02-12 ENCOUNTER — Other Ambulatory Visit: Payer: Self-pay

## 2021-02-12 ENCOUNTER — Encounter: Payer: Self-pay | Admitting: Hematology and Oncology

## 2021-02-12 ENCOUNTER — Inpatient Hospital Stay: Payer: Medicaid Other

## 2021-02-12 DIAGNOSIS — D696 Thrombocytopenia, unspecified: Secondary | ICD-10-CM

## 2021-02-12 LAB — CBC AND DIFFERENTIAL
HCT: 44 (ref 36–46)
Hemoglobin: 14.7 (ref 12.0–16.0)
Neutrophils Absolute: 2.59
Platelets: 123 — AB (ref 150–399)
WBC: 3.7

## 2021-02-12 LAB — CBC: RBC: 4.68 (ref 3.87–5.11)

## 2021-02-13 ENCOUNTER — Other Ambulatory Visit: Payer: Self-pay | Admitting: Pharmacist

## 2021-02-14 ENCOUNTER — Other Ambulatory Visit (HOSPITAL_COMMUNITY): Payer: Self-pay

## 2021-02-14 ENCOUNTER — Inpatient Hospital Stay: Payer: Medicaid Other

## 2021-02-14 ENCOUNTER — Other Ambulatory Visit: Payer: Self-pay

## 2021-02-14 VITALS — BP 135/96 | HR 68 | Temp 98.0°F | Resp 18 | Ht 60.0 in | Wt 145.8 lb

## 2021-02-14 DIAGNOSIS — D693 Immune thrombocytopenic purpura: Secondary | ICD-10-CM | POA: Diagnosis not present

## 2021-02-14 DIAGNOSIS — D696 Thrombocytopenia, unspecified: Secondary | ICD-10-CM

## 2021-02-14 MED ORDER — ROMIPLOSTIM 250 MCG ~~LOC~~ SOLR
3.0000 ug/kg | Freq: Once | SUBCUTANEOUS | Status: AC
  Administered 2021-02-14: 195 ug via SUBCUTANEOUS
  Filled 2021-02-14: qty 0.39

## 2021-02-14 NOTE — Patient Instructions (Signed)
Romiplostim injection What is this medicine? ROMIPLOSTIM (roe mi PLOE stim) helps your body make more platelets. This medicine is used to treat low platelets caused by chronic idiopathic thrombocytopenic purpura (ITP) or a bone marrow syndrome caused by radiation sickness. This medicine may be used for other purposes; ask your health care provider or pharmacist if you have questions. COMMON BRAND NAME(S): Nplate What should I tell my health care provider before I take this medicine? They need to know if you have any of these conditions:  blood clots  myelodysplastic syndrome  an unusual or allergic reaction to romiplostim, mannitol, other medicines, foods, dyes, or preservatives  pregnant or trying to get pregnant  breast-feeding How should I use this medicine? This medicine is injected under the skin. It is given by a health care provider in a hospital or clinic setting. A special MedGuide will be given to you before each treatment. Be sure to read this information carefully each time. Talk to your health care provider about the use of this medicine in children. While it may be prescribed for children as young as newborns for selected conditions, precautions do apply. Overdosage: If you think you have taken too much of this medicine contact a poison control center or emergency room at once. NOTE: This medicine is only for you. Do not share this medicine with others. What if I miss a dose? Keep appointments for follow-up doses. It is important not to miss your dose. Call your health care provider if you are unable to keep an appointment. What may interact with this medicine? Interactions are not expected. This list may not describe all possible interactions. Give your health care provider a list of all the medicines, herbs, non-prescription drugs, or dietary supplements you use. Also tell them if you smoke, drink alcohol, or use illegal drugs. Some items may interact with your  medicine. What should I watch for while using this medicine? Visit your health care provider for regular checks on your progress. You may need blood work done while you are taking this medicine. Your condition will be monitored carefully while you are receiving this medicine. It is important not to miss any appointments. What side effects may I notice from receiving this medicine? Side effects that you should report to your doctor or health care professional as soon as possible:  allergic reactions (skin rash, itching or hives; swelling of the face, lips, or tongue)  bleeding (bloody or black, tarry stools; red or dark brown urine; spitting up blood or brown material that looks like coffee grounds; red spots on the skin; unusual bruising or bleeding from the eyes, gums, or nose)  blood clot (chest pain; shortness of breath; pain, swelling, or warmth in the leg)  stroke (changes in vision; confusion; trouble speaking or understanding; severe headaches; sudden numbness or weakness of the face, arm or leg; trouble walking; dizziness; loss of balance or coordination) Side effects that usually do not require medical attention (report to your doctor or health care professional if they continue or are bothersome):  diarrhea  dizziness  headache  joint pain  muscle pain  stomach pain  trouble sleeping This list may not describe all possible side effects. Call your doctor for medical advice about side effects. You may report side effects to FDA at 1-800-FDA-1088. Where should I keep my medicine? This medicine is given in a hospital or clinic. It will not be stored at home. NOTE: This sheet is a summary. It may not cover all possible   information. If you have questions about this medicine, talk to your doctor, pharmacist, or health care provider.  2021 Elsevier/Gold Standard (2019-10-11 10:28:13)  

## 2021-02-15 ENCOUNTER — Other Ambulatory Visit (HOSPITAL_COMMUNITY): Payer: Self-pay

## 2021-02-15 ENCOUNTER — Other Ambulatory Visit (HOSPITAL_COMMUNITY): Payer: Self-pay | Admitting: Family Medicine

## 2021-02-15 MED ORDER — FAMOTIDINE 20 MG PO TABS
20.0000 mg | ORAL_TABLET | Freq: Every day | ORAL | 1 refills | Status: DC
  Filled 2021-02-15: qty 90, 90d supply, fill #0

## 2021-02-15 MED ORDER — ARIPIPRAZOLE 10 MG PO TABS
10.0000 mg | ORAL_TABLET | Freq: Every day | ORAL | 1 refills | Status: DC
  Filled 2021-02-15 – 2021-04-10 (×2): qty 90, 90d supply, fill #0

## 2021-02-15 MED ORDER — FLUOXETINE HCL 40 MG PO CAPS
40.0000 mg | ORAL_CAPSULE | Freq: Every day | ORAL | 1 refills | Status: DC
  Filled 2021-02-15 – 2021-05-31 (×2): qty 90, 90d supply, fill #0
  Filled 2021-09-17: qty 90, 90d supply, fill #1

## 2021-02-15 NOTE — Progress Notes (Signed)
[facility]  [address] [place],  [place]  [address] [phone]  Clinic Day:  02/19/2021  Referring physician: Volanda Napoleon, MD  This document serves as a record of services personally performed by Dequincy Macarthur Critchley, MD. It was created on their behalf by [facility] E, a trained medical scribe. The creation of this record is based on the scribe's personal observations and the provider's statements to them.  HISTORY OF PRESENT ILLNESS:  The patient is a 50 y.o. female  with thrombocytopenia.  Per past records, alcoholic cirrrhosis and secondary splenomegaly are major reasons behind her low platelets.  However, there has also been an immune component to where she was receiving IV Nplate in [place].  Over these past weeks, she has been receiving weekly Nplate to get her platelets to more ideal levels.  Overall, she has been doing well.  She denies having any bleeding/bruising issues which concern her for severe thrombocytopenia being an issue.    PHYSICAL EXAM:  Blood pressure 119/85, pulse 84, temperature 98.2 F (36.8 C), resp. rate 16, height 5' (1.524 m), weight 144 lb 1.6 oz (65.4 kg), SpO2 93 %. Wt Readings from Last 3 Encounters:  02/19/21 144 lb 1.6 oz (65.4 kg)  02/14/21 145 lb 12 oz (66.1 kg)  02/07/21 142 lb 8 oz (64.6 kg)   Body mass index is 28.14 kg/m. Performance status (ECOG): 1 Physical Exam Constitutional:      Appearance: Normal appearance. She is not ill-appearing.  HENT:     Mouth/Throat:     Mouth: Mucous membranes are moist.     Pharynx: Oropharynx is clear. No oropharyngeal exudate or posterior oropharyngeal erythema.  Cardiovascular:     Rate and Rhythm: Normal rate and regular rhythm.     Heart sounds: No murmur heard.   No friction rub. No gallop.  Pulmonary:     Effort: Pulmonary effort is normal. No respiratory distress.     Breath sounds: Normal breath sounds. No wheezing, rhonchi or rales.  Chest:   Breasts:    Right: No axillary adenopathy or supraclavicular adenopathy.     Left: No axillary adenopathy or supraclavicular adenopathy.  Abdominal:     General: Bowel sounds are normal. There is no distension.     Palpations: Abdomen is soft. There is no mass.     Tenderness: There is no abdominal tenderness.  Musculoskeletal:        General: No swelling.     Right lower leg: No edema.     Left lower leg: No edema.  Lymphadenopathy:     Cervical: No cervical adenopathy.     Upper Body:     Right upper body: No supraclavicular or axillary adenopathy.     Left upper body: No supraclavicular or axillary adenopathy.     Lower Body: No right inguinal adenopathy. No left inguinal adenopathy.  Skin:    General: Skin is warm.     Coloration: Skin is not jaundiced.     Findings: No lesion or rash.  Neurological:     General: No focal deficit present.     Mental Status: She is alert and oriented to person, place, and time. Mental status is at baseline.     Cranial Nerves: Cranial nerves are intact.  Psychiatric:        Mood and Affect: Mood normal.        Behavior: Behavior normal.        Thought Content: Thought content normal.  LABS:   CBC Latest Ref Rng & Units 02/19/2021 02/12/2021 02/06/2021  WBC - 3.7 3.7 3.2  Hemoglobin 12.0 - 16.0 14.5 14.7 15.4  Hematocrit 36 - 46 42 44 46  Platelets 150 - 399 108(A) 123(A) 111(A)   CMP Latest Ref Rng & Units 12/25/2020 12/18/2020 11/15/2020  Glucose 70 - 99 mg/dL - - 93  BUN 4 - 21 10 7 6   Creatinine 0.5 - 1.1 0.6 0.5 0.64  Sodium 137 - 147 137 135(A) 139  Potassium 3.4 - 5.3 3.7 4.3 3.7  Chloride 99 - 108 100 106 102  CO2 13 - 22 32(A) 23(A) 33(H)  Calcium 8.7 - 10.7 8.8 8.9 9.8  Total Protein 6.5 - 8.1 g/dL - - 7.3  Total Bilirubin 0.3 - 1.2 mg/dL - - 1.6(H)  Alkaline Phos 25 - 125 326(A) 304(A) 183(H)  AST 13 - 35 68(A) 42(A) 30  ALT 7 - 35 32 20 17   Vitamin B-12 180 - 914 pg/mL 384   Component Ref Range & Units 1 mo ago    Folate >5.9 ng/mL 17.3    ASSESSMENT & PLAN:  A 50 y.o. female with thrombocytopenia secondary to cirrhosis and splenomegaly, as well as a component of ITP.  I am very pleased with the rise in her platelets, which reflects its efficacy in her clinical picture.  As she is doing well while on Nplate, she will continue to take it weekly.  I will see her back in 4 months for repeat clinical assessment.  The patient understands all the plans discussed today and is in agreement with them.   I, Rita Ohara, am acting as scribe for Marice Potter, MD    I have reviewed this report as typed by the medical scribe, and it is complete and accurate.  Dequincy Macarthur Critchley, MD

## 2021-02-16 ENCOUNTER — Other Ambulatory Visit (HOSPITAL_COMMUNITY): Payer: Self-pay

## 2021-02-19 ENCOUNTER — Telehealth: Payer: Self-pay | Admitting: Oncology

## 2021-02-19 ENCOUNTER — Inpatient Hospital Stay (INDEPENDENT_AMBULATORY_CARE_PROVIDER_SITE_OTHER): Payer: Medicaid Other | Admitting: Oncology

## 2021-02-19 ENCOUNTER — Inpatient Hospital Stay: Payer: Medicaid Other

## 2021-02-19 ENCOUNTER — Other Ambulatory Visit (HOSPITAL_COMMUNITY): Payer: Self-pay

## 2021-02-19 ENCOUNTER — Other Ambulatory Visit: Payer: Self-pay | Admitting: Oncology

## 2021-02-19 ENCOUNTER — Encounter: Payer: Self-pay | Admitting: Oncology

## 2021-02-19 ENCOUNTER — Telehealth: Payer: Self-pay | Admitting: Neurology

## 2021-02-19 VITALS — BP 119/85 | HR 84 | Temp 98.2°F | Resp 16 | Ht 60.0 in | Wt 144.1 lb

## 2021-02-19 DIAGNOSIS — D696 Thrombocytopenia, unspecified: Secondary | ICD-10-CM

## 2021-02-19 LAB — CBC: RBC: 4.57 (ref 3.87–5.11)

## 2021-02-19 LAB — CBC AND DIFFERENTIAL
HCT: 42 (ref 36–46)
Hemoglobin: 14.5 (ref 12.0–16.0)
Neutrophils Absolute: 2.52
Platelets: 108 — AB (ref 150–399)
WBC: 3.7

## 2021-02-19 NOTE — Telephone Encounter (Signed)
Per 6/13 LOS, patient scheduled for 10/13 Labs, Follow up - Also scheduled 6/22 - 10/26 weekly Nplate Injections.  Requested patient be give Appt Summary on 6/15

## 2021-02-19 NOTE — Telephone Encounter (Signed)
Pt called in and left a message wanting to get her results from her EEG

## 2021-02-19 NOTE — Telephone Encounter (Signed)
Pt called and informed that EEG was normal, she stated that no seizures since increasing Keppra to 750 BID she is just very sleepy she says it knocks her out,

## 2021-02-19 NOTE — Telephone Encounter (Signed)
Pls let her know the EEG was normal, however it is not like a pregnancy test that is positive or negative, just a snapshot of her brainwaves when the test is being done. Any further seizures since increasing the Keppra to 750mg  BID? Thanks

## 2021-02-21 ENCOUNTER — Other Ambulatory Visit: Payer: Self-pay | Admitting: Pharmacist

## 2021-02-21 ENCOUNTER — Other Ambulatory Visit: Payer: Self-pay

## 2021-02-21 ENCOUNTER — Other Ambulatory Visit (HOSPITAL_COMMUNITY): Payer: Self-pay

## 2021-02-21 ENCOUNTER — Inpatient Hospital Stay: Payer: Medicaid Other

## 2021-02-21 VITALS — BP 136/86 | HR 85 | Temp 98.0°F | Resp 18 | Ht 60.0 in | Wt 143.8 lb

## 2021-02-21 DIAGNOSIS — D696 Thrombocytopenia, unspecified: Secondary | ICD-10-CM

## 2021-02-21 DIAGNOSIS — D693 Immune thrombocytopenic purpura: Secondary | ICD-10-CM | POA: Diagnosis not present

## 2021-02-21 MED ORDER — ROMIPLOSTIM 250 MCG ~~LOC~~ SOLR
3.0000 ug/kg | Freq: Once | SUBCUTANEOUS | Status: AC
  Administered 2021-02-21: 195 ug via SUBCUTANEOUS
  Filled 2021-02-21: qty 0.39

## 2021-02-21 NOTE — Patient Instructions (Signed)
Romiplostim injection What is this medication? ROMIPLOSTIM (roe mi PLOE stim) helps your body make more platelets. This medicine is used to treat low platelets caused by chronic idiopathic thrombocytopenic purpura (ITP) or a bone marrow syndrome caused by radiation sickness. This medicine may be used for other purposes; ask your health care provider or pharmacist if you have questions. COMMON BRAND NAME(S): Nplate What should I tell my care team before I take this medication? They need to know if you have any of these conditions: blood clots myelodysplastic syndrome an unusual or allergic reaction to romiplostim, mannitol, other medicines, foods, dyes, or preservatives pregnant or trying to get pregnant breast-feeding How should I use this medication? This medicine is injected under the skin. It is given by a health care provider in a hospital or clinic setting. A special MedGuide will be given to you before each treatment. Be sure to read this information carefully each time. Talk to your health care provider about the use of this medicine in children. While it may be prescribed for children as young as newborns for selected conditions, precautions do apply. Overdosage: If you think you have taken too much of this medicine contact a poison control center or emergency room at once. NOTE: This medicine is only for you. Do not share this medicine with others. What if I miss a dose? Keep appointments for follow-up doses. It is important not to miss your dose. Call your health care provider if you are unable to keep an appointment. What may interact with this medication? Interactions are not expected. This list may not describe all possible interactions. Give your health care provider a list of all the medicines, herbs, non-prescription drugs, or dietary supplements you use. Also tell them if you smoke, drink alcohol, or use illegal drugs. Some items may interact with your medicine. What should I  watch for while using this medication? Visit your health care provider for regular checks on your progress. You may need blood work done while you are taking this medicine. Your condition will be monitored carefully while you are receiving this medicine. It is important not to miss any appointments. What side effects may I notice from receiving this medication? Side effects that you should report to your doctor or health care professional as soon as possible: allergic reactions (skin rash, itching or hives; swelling of the face, lips, or tongue) bleeding (bloody or black, tarry stools; red or dark brown urine; spitting up blood or brown material that looks like coffee grounds; red spots on the skin; unusual bruising or bleeding from the eyes, gums, or nose) blood clot (chest pain; shortness of breath; pain, swelling, or warmth in the leg) stroke (changes in vision; confusion; trouble speaking or understanding; severe headaches; sudden numbness or weakness of the face, arm or leg; trouble walking; dizziness; loss of balance or coordination) Side effects that usually do not require medical attention (report to your doctor or health care professional if they continue or are bothersome): diarrhea dizziness headache joint pain muscle pain stomach pain trouble sleeping This list may not describe all possible side effects. Call your doctor for medical advice about side effects. You may report side effects to FDA at 1-800-FDA-1088. Where should I keep my medication? This medicine is given in a hospital or clinic. It will not be stored at home. NOTE: This sheet is a summary. It may not cover all possible information. If you have questions about this medicine, talk to your doctor, pharmacist, or health care provider.    2022 Elsevier/Gold Standard (2019-10-11 10:28:13)  

## 2021-02-21 NOTE — Progress Notes (Signed)
Called and requested transportation through [facility] for patient for DOS 06/20, 06/22, 06/27, and 06/29.

## 2021-02-22 ENCOUNTER — Other Ambulatory Visit (HOSPITAL_COMMUNITY): Payer: Self-pay

## 2021-02-23 ENCOUNTER — Other Ambulatory Visit (HOSPITAL_COMMUNITY): Payer: Self-pay

## 2021-02-24 ENCOUNTER — Other Ambulatory Visit (HOSPITAL_COMMUNITY): Payer: Self-pay

## 2021-02-26 ENCOUNTER — Inpatient Hospital Stay: Payer: Medicaid Other

## 2021-02-26 ENCOUNTER — Encounter: Payer: Self-pay | Admitting: Hematology and Oncology

## 2021-02-26 ENCOUNTER — Other Ambulatory Visit: Payer: Self-pay

## 2021-02-26 ENCOUNTER — Other Ambulatory Visit (HOSPITAL_COMMUNITY): Payer: Self-pay

## 2021-02-26 DIAGNOSIS — D696 Thrombocytopenia, unspecified: Secondary | ICD-10-CM

## 2021-02-26 LAB — CBC AND DIFFERENTIAL
HCT: 44 (ref 36–46)
Hemoglobin: 14.7 (ref 12.0–16.0)
Neutrophils Absolute: 2.24
Platelets: 86 — AB (ref 150–399)
WBC: 3.5

## 2021-02-26 LAB — CBC: RBC: 4.86 (ref 3.87–5.11)

## 2021-02-27 ENCOUNTER — Other Ambulatory Visit (HOSPITAL_COMMUNITY): Payer: Self-pay

## 2021-02-28 ENCOUNTER — Other Ambulatory Visit (HOSPITAL_COMMUNITY): Payer: Self-pay

## 2021-02-28 ENCOUNTER — Inpatient Hospital Stay: Payer: Medicaid Other

## 2021-02-28 ENCOUNTER — Other Ambulatory Visit: Payer: Self-pay

## 2021-02-28 VITALS — BP 120/78 | HR 86 | Temp 98.2°F | Resp 18 | Ht 60.0 in | Wt 143.0 lb

## 2021-02-28 DIAGNOSIS — D696 Thrombocytopenia, unspecified: Secondary | ICD-10-CM

## 2021-02-28 DIAGNOSIS — D693 Immune thrombocytopenic purpura: Secondary | ICD-10-CM | POA: Diagnosis not present

## 2021-02-28 MED ORDER — ROMIPLOSTIM 250 MCG ~~LOC~~ SOLR
3.0000 ug/kg | Freq: Once | SUBCUTANEOUS | Status: AC
Start: 2021-02-28 — End: 2021-02-28
  Administered 2021-02-28: 195 ug via SUBCUTANEOUS
  Filled 2021-02-28: qty 0.39

## 2021-02-28 NOTE — Patient Instructions (Signed)
Romiplostim injection What is this medication? ROMIPLOSTIM (roe mi PLOE stim) helps your body make more platelets. This medicine is used to treat low platelets caused by chronic idiopathic thrombocytopenic purpura (ITP) or a bone marrow syndrome caused by radiation sickness. This medicine may be used for other purposes; ask your health care provider or pharmacist if you have questions. COMMON BRAND NAME(S): Nplate What should I tell my care team before I take this medication? They need to know if you have any of these conditions: blood clots myelodysplastic syndrome an unusual or allergic reaction to romiplostim, mannitol, other medicines, foods, dyes, or preservatives pregnant or trying to get pregnant breast-feeding How should I use this medication? This medicine is injected under the skin. It is given by a health care provider in a hospital or clinic setting. A special MedGuide will be given to you before each treatment. Be sure to read this information carefully each time. Talk to your health care provider about the use of this medicine in children. While it may be prescribed for children as young as newborns for selected conditions, precautions do apply. Overdosage: If you think you have taken too much of this medicine contact a poison control center or emergency room at once. NOTE: This medicine is only for you. Do not share this medicine with others. What if I miss a dose? Keep appointments for follow-up doses. It is important not to miss your dose. Call your health care provider if you are unable to keep an appointment. What may interact with this medication? Interactions are not expected. This list may not describe all possible interactions. Give your health care provider a list of all the medicines, herbs, non-prescription drugs, or dietary supplements you use. Also tell them if you smoke, drink alcohol, or use illegal drugs. Some items may interact with your medicine. What should I  watch for while using this medication? Visit your health care provider for regular checks on your progress. You may need blood work done while you are taking this medicine. Your condition will be monitored carefully while you are receiving this medicine. It is important not to miss any appointments. What side effects may I notice from receiving this medication? Side effects that you should report to your doctor or health care professional as soon as possible: allergic reactions (skin rash, itching or hives; swelling of the face, lips, or tongue) bleeding (bloody or black, tarry stools; red or dark brown urine; spitting up blood or brown material that looks like coffee grounds; red spots on the skin; unusual bruising or bleeding from the eyes, gums, or nose) blood clot (chest pain; shortness of breath; pain, swelling, or warmth in the leg) stroke (changes in vision; confusion; trouble speaking or understanding; severe headaches; sudden numbness or weakness of the face, arm or leg; trouble walking; dizziness; loss of balance or coordination) Side effects that usually do not require medical attention (report to your doctor or health care professional if they continue or are bothersome): diarrhea dizziness headache joint pain muscle pain stomach pain trouble sleeping This list may not describe all possible side effects. Call your doctor for medical advice about side effects. You may report side effects to FDA at 1-800-FDA-1088. Where should I keep my medication? This medicine is given in a hospital or clinic. It will not be stored at home. NOTE: This sheet is a summary. It may not cover all possible information. If you have questions about this medicine, talk to your doctor, pharmacist, or health care provider.    2022 Elsevier/Gold Standard (2019-10-11 10:28:13)  

## 2021-03-01 ENCOUNTER — Other Ambulatory Visit: Payer: Self-pay | Admitting: Pharmacist

## 2021-03-01 ENCOUNTER — Other Ambulatory Visit (HOSPITAL_COMMUNITY): Payer: Self-pay

## 2021-03-01 ENCOUNTER — Telehealth: Payer: Self-pay

## 2021-03-01 ENCOUNTER — Telehealth (HOSPITAL_COMMUNITY): Payer: Self-pay | Admitting: *Deleted

## 2021-03-01 NOTE — Telephone Encounter (Signed)
Med refills  Klonopin [facility] [place] retired   [employer]

## 2021-03-01 NOTE — Telephone Encounter (Signed)
Patient called asking for Klonopin.  According to records she has been abusing this and Xanax.  She stated that she needed this drug now.  She has not been taking other meds.  Patient given number and address for [facility].  She stated her boyfriend will take her there this evening. Patient not suicidal.  Patient told to call 911 or go to ER if necessary.

## 2021-03-02 ENCOUNTER — Other Ambulatory Visit (HOSPITAL_COMMUNITY): Payer: Self-pay

## 2021-03-02 NOTE — Telephone Encounter (Signed)
Error

## 2021-03-05 ENCOUNTER — Inpatient Hospital Stay: Payer: Medicaid Other

## 2021-03-05 ENCOUNTER — Other Ambulatory Visit (HOSPITAL_COMMUNITY): Payer: Self-pay

## 2021-03-05 ENCOUNTER — Encounter: Payer: Self-pay | Admitting: Hematology and Oncology

## 2021-03-05 DIAGNOSIS — D696 Thrombocytopenia, unspecified: Secondary | ICD-10-CM

## 2021-03-05 LAB — CBC AND DIFFERENTIAL
HCT: 44 (ref 36–46)
Hemoglobin: 14.8 (ref 12.0–16.0)
Neutrophils Absolute: 1.65
Platelets: 79 — AB (ref 150–399)
WBC: 2.9

## 2021-03-05 LAB — CBC: RBC: 4.8 (ref 3.87–5.11)

## 2021-03-07 ENCOUNTER — Other Ambulatory Visit: Payer: Self-pay

## 2021-03-07 ENCOUNTER — Inpatient Hospital Stay: Payer: Medicaid Other

## 2021-03-07 VITALS — BP 115/75 | HR 88 | Temp 97.9°F | Resp 18 | Ht 60.0 in | Wt 143.0 lb

## 2021-03-07 DIAGNOSIS — D696 Thrombocytopenia, unspecified: Secondary | ICD-10-CM

## 2021-03-07 DIAGNOSIS — D693 Immune thrombocytopenic purpura: Secondary | ICD-10-CM | POA: Diagnosis not present

## 2021-03-07 MED ORDER — ROMIPLOSTIM 250 MCG ~~LOC~~ SOLR
3.0000 ug/kg | Freq: Once | SUBCUTANEOUS | Status: AC
Start: 2021-03-07 — End: 2021-03-07
  Administered 2021-03-07: 195 ug via SUBCUTANEOUS
  Filled 2021-03-07: qty 0.39

## 2021-03-07 NOTE — Patient Instructions (Signed)
Romiplostim injection What is this medication? ROMIPLOSTIM (roe mi PLOE stim) helps your body make more platelets. This medicine is used to treat low platelets caused by chronic idiopathic thrombocytopenic purpura (ITP) or a bone marrow syndrome caused by radiation sickness. This medicine may be used for other purposes; ask your health care provider or pharmacist if you have questions. COMMON BRAND NAME(S): Nplate What should I tell my care team before I take this medication? They need to know if you have any of these conditions: blood clots myelodysplastic syndrome an unusual or allergic reaction to romiplostim, mannitol, other medicines, foods, dyes, or preservatives pregnant or trying to get pregnant breast-feeding How should I use this medication? This medicine is injected under the skin. It is given by a health care provider in a hospital or clinic setting. A special MedGuide will be given to you before each treatment. Be sure to read this information carefully each time. Talk to your health care provider about the use of this medicine in children. While it may be prescribed for children as young as newborns for selected conditions, precautions do apply. Overdosage: If you think you have taken too much of this medicine contact a poison control center or emergency room at once. NOTE: This medicine is only for you. Do not share this medicine with others. What if I miss a dose? Keep appointments for follow-up doses. It is important not to miss your dose. Call your health care provider if you are unable to keep an appointment. What may interact with this medication? Interactions are not expected. This list may not describe all possible interactions. Give your health care provider a list of all the medicines, herbs, non-prescription drugs, or dietary supplements you use. Also tell them if you smoke, drink alcohol, or use illegal drugs. Some items may interact with your medicine. What should I  watch for while using this medication? Visit your health care provider for regular checks on your progress. You may need blood work done while you are taking this medicine. Your condition will be monitored carefully while you are receiving this medicine. It is important not to miss any appointments. What side effects may I notice from receiving this medication? Side effects that you should report to your doctor or health care professional as soon as possible: allergic reactions (skin rash, itching or hives; swelling of the face, lips, or tongue) bleeding (bloody or black, tarry stools; red or dark brown urine; spitting up blood or brown material that looks like coffee grounds; red spots on the skin; unusual bruising or bleeding from the eyes, gums, or nose) blood clot (chest pain; shortness of breath; pain, swelling, or warmth in the leg) stroke (changes in vision; confusion; trouble speaking or understanding; severe headaches; sudden numbness or weakness of the face, arm or leg; trouble walking; dizziness; loss of balance or coordination) Side effects that usually do not require medical attention (report to your doctor or health care professional if they continue or are bothersome): diarrhea dizziness headache joint pain muscle pain stomach pain trouble sleeping This list may not describe all possible side effects. Call your doctor for medical advice about side effects. You may report side effects to FDA at 1-800-FDA-1088. Where should I keep my medication? This medicine is given in a hospital or clinic. It will not be stored at home. NOTE: This sheet is a summary. It may not cover all possible information. If you have questions about this medicine, talk to your doctor, pharmacist, or health care provider.    2022 Elsevier/Gold Standard (2019-10-11 10:28:13)  

## 2021-03-07 NOTE — Progress Notes (Signed)
Called and scheduled DOS 07/05, 07/06, 07/11, 07/13, 07/18, 07/20, 07/25, and 07/27 with [facility] for transportation.

## 2021-03-07 NOTE — Progress Notes (Signed)
1300 The patient was here today for her Nplate (Romiplostim)shot. The patient complaint of lower extremities swelling with burning and discomfort in both feet. Both feet were swollen  to her calf bilaterally. She also complaint of chest discomfort. Vital signs were stable. The patient stated that the feet burning and swelling has been going on for a week, but the chest discomfort has been going on for 2-3 days now. I  immediately informed Wilber Bihari, NP of the above. She assessed the patient and we will call EMS to transport the patient to the Emergency Room at [facility]. The patient agreed to the transport. The patient was stable at discharge via ambulance at 1335

## 2021-03-13 ENCOUNTER — Other Ambulatory Visit (HOSPITAL_COMMUNITY): Payer: Self-pay

## 2021-03-13 ENCOUNTER — Inpatient Hospital Stay: Payer: Medicaid Other

## 2021-03-14 ENCOUNTER — Inpatient Hospital Stay: Payer: Medicaid Other

## 2021-03-14 ENCOUNTER — Other Ambulatory Visit (HOSPITAL_COMMUNITY): Payer: Self-pay

## 2021-03-15 ENCOUNTER — Other Ambulatory Visit (HOSPITAL_COMMUNITY): Payer: Self-pay

## 2021-03-16 ENCOUNTER — Other Ambulatory Visit: Payer: Self-pay | Admitting: Pharmacist

## 2021-03-19 ENCOUNTER — Other Ambulatory Visit: Payer: Self-pay | Admitting: Hematology and Oncology

## 2021-03-19 ENCOUNTER — Inpatient Hospital Stay: Payer: Medicaid Other | Attending: Hematology & Oncology

## 2021-03-19 ENCOUNTER — Other Ambulatory Visit: Payer: Self-pay

## 2021-03-19 DIAGNOSIS — D696 Thrombocytopenia, unspecified: Secondary | ICD-10-CM

## 2021-03-19 DIAGNOSIS — D693 Immune thrombocytopenic purpura: Secondary | ICD-10-CM | POA: Insufficient documentation

## 2021-03-19 LAB — CBC AND DIFFERENTIAL
HCT: 44 (ref 36–46)
Hemoglobin: 14.7 (ref 12.0–16.0)
Neutrophils Absolute: 1.71
Platelets: 90 — AB (ref 150–399)
WBC: 2.8

## 2021-03-19 LAB — CBC
Absolute Lymphocytes: 0.53 — AB (ref 0.65–4.75)
MCV: 93 (ref 81–99)
RBC: 4.76 (ref 3.87–5.11)

## 2021-03-21 ENCOUNTER — Inpatient Hospital Stay: Payer: Medicaid Other

## 2021-03-21 ENCOUNTER — Other Ambulatory Visit: Payer: Self-pay

## 2021-03-21 VITALS — BP 104/71 | HR 82 | Temp 97.9°F | Resp 18 | Ht 60.0 in | Wt 139.2 lb

## 2021-03-21 DIAGNOSIS — D693 Immune thrombocytopenic purpura: Secondary | ICD-10-CM | POA: Diagnosis not present

## 2021-03-21 DIAGNOSIS — D696 Thrombocytopenia, unspecified: Secondary | ICD-10-CM

## 2021-03-21 MED ORDER — ROMIPLOSTIM 250 MCG ~~LOC~~ SOLR
3.0000 ug/kg | Freq: Once | SUBCUTANEOUS | Status: AC
Start: 2021-03-21 — End: 2021-03-21
  Administered 2021-03-21: 195 ug via SUBCUTANEOUS
  Filled 2021-03-21: qty 0.39

## 2021-03-21 NOTE — Patient Instructions (Signed)
Romiplostim injection What is this medication? ROMIPLOSTIM (roe mi PLOE stim) helps your body make more platelets. This medicine is used to treat low platelets caused by chronic idiopathic thrombocytopenic purpura (ITP) or a bone marrow syndrome caused by radiation sickness. This medicine may be used for other purposes; ask your health care provider or pharmacist if you have questions. COMMON BRAND NAME(S): Nplate What should I tell my care team before I take this medication? They need to know if you have any of these conditions: blood clots myelodysplastic syndrome an unusual or allergic reaction to romiplostim, mannitol, other medicines, foods, dyes, or preservatives pregnant or trying to get pregnant breast-feeding How should I use this medication? This medicine is injected under the skin. It is given by a health care provider in a hospital or clinic setting. A special MedGuide will be given to you before each treatment. Be sure to read this information carefully each time. Talk to your health care provider about the use of this medicine in children. While it may be prescribed for children as young as newborns for selected conditions, precautions do apply. Overdosage: If you think you have taken too much of this medicine contact a poison control center or emergency room at once. NOTE: This medicine is only for you. Do not share this medicine with others. What if I miss a dose? Keep appointments for follow-up doses. It is important not to miss your dose. Call your health care provider if you are unable to keep an appointment. What may interact with this medication? Interactions are not expected. This list may not describe all possible interactions. Give your health care provider a list of all the medicines, herbs, non-prescription drugs, or dietary supplements you use. Also tell them if you smoke, drink alcohol, or use illegal drugs. Some items may interact with your medicine. What should I  watch for while using this medication? Visit your health care provider for regular checks on your progress. You may need blood work done while you are taking this medicine. Your condition will be monitored carefully while you are receiving this medicine. It is important not to miss any appointments. What side effects may I notice from receiving this medication? Side effects that you should report to your doctor or health care professional as soon as possible: allergic reactions (skin rash, itching or hives; swelling of the face, lips, or tongue) bleeding (bloody or black, tarry stools; red or dark brown urine; spitting up blood or brown material that looks like coffee grounds; red spots on the skin; unusual bruising or bleeding from the eyes, gums, or nose) blood clot (chest pain; shortness of breath; pain, swelling, or warmth in the leg) stroke (changes in vision; confusion; trouble speaking or understanding; severe headaches; sudden numbness or weakness of the face, arm or leg; trouble walking; dizziness; loss of balance or coordination) Side effects that usually do not require medical attention (report to your doctor or health care professional if they continue or are bothersome): diarrhea dizziness headache joint pain muscle pain stomach pain trouble sleeping This list may not describe all possible side effects. Call your doctor for medical advice about side effects. You may report side effects to FDA at 1-800-FDA-1088. Where should I keep my medication? This medicine is given in a hospital or clinic. It will not be stored at home. NOTE: This sheet is a summary. It may not cover all possible information. If you have questions about this medicine, talk to your doctor, pharmacist, or health care provider.    2022 Elsevier/Gold Standard (2019-10-11 10:28:13)  

## 2021-03-23 ENCOUNTER — Other Ambulatory Visit (HOSPITAL_COMMUNITY): Payer: Self-pay

## 2021-03-26 ENCOUNTER — Inpatient Hospital Stay: Payer: Medicaid Other

## 2021-03-26 ENCOUNTER — Other Ambulatory Visit: Payer: Self-pay | Admitting: Hematology and Oncology

## 2021-03-26 ENCOUNTER — Encounter: Payer: Self-pay | Admitting: Hematology and Oncology

## 2021-03-26 ENCOUNTER — Other Ambulatory Visit: Payer: Self-pay

## 2021-03-26 DIAGNOSIS — D696 Thrombocytopenia, unspecified: Secondary | ICD-10-CM

## 2021-03-26 LAB — CBC AND DIFFERENTIAL
HCT: 45 (ref 36–46)
Hemoglobin: 14.9 (ref 12.0–16.0)
Neutrophils Absolute: 1.82
Platelets: 42 — AB (ref 150–399)
WBC: 3.8

## 2021-03-26 LAB — CBC: RBC: 4.96 (ref 3.87–5.11)

## 2021-03-26 NOTE — Progress Notes (Unsigned)
t

## 2021-03-28 ENCOUNTER — Inpatient Hospital Stay: Payer: Medicaid Other

## 2021-04-02 ENCOUNTER — Telehealth: Payer: Self-pay | Admitting: Oncology

## 2021-04-02 ENCOUNTER — Inpatient Hospital Stay: Payer: Medicaid Other

## 2021-04-02 ENCOUNTER — Encounter: Payer: Self-pay | Admitting: Hematology and Oncology

## 2021-04-02 DIAGNOSIS — D696 Thrombocytopenia, unspecified: Secondary | ICD-10-CM

## 2021-04-02 LAB — CBC: RBC: 4.83 (ref 3.87–5.11)

## 2021-04-02 LAB — CBC AND DIFFERENTIAL
HCT: 44 (ref 36–46)
Hemoglobin: 14.5 (ref 12.0–16.0)
Neutrophils Absolute: 4.9
Platelets: 33 — AB (ref 150–399)
WBC: 7.2

## 2021-04-02 NOTE — Telephone Encounter (Signed)
Patient called to find out who called her last wk.  Did not see note.  She does have an Appt today for Labs at 1:15 pm and Injection on 7/27 at 1:15 pm.  Per Katrina, patient needs to keep Appt for today  She also requested results from last weeks Lab Appt.  Informed patient to let Phlebotomist today at her Appt

## 2021-04-03 ENCOUNTER — Inpatient Hospital Stay (HOSPITAL_COMMUNITY)
Admission: EM | Admit: 2021-04-03 | Discharge: 2021-04-08 | DRG: 813 | Disposition: A | Discharge status: Home / Self Care | Payer: Medicaid Other | Attending: Internal Medicine | Admitting: Internal Medicine

## 2021-04-03 ENCOUNTER — Other Ambulatory Visit: Payer: Self-pay

## 2021-04-03 DIAGNOSIS — F319 Bipolar disorder, unspecified: Secondary | ICD-10-CM | POA: Diagnosis present

## 2021-04-03 DIAGNOSIS — K852 Alcohol induced acute pancreatitis without necrosis or infection: Secondary | ICD-10-CM

## 2021-04-03 DIAGNOSIS — M5127 Other intervertebral disc displacement, lumbosacral region: Secondary | ICD-10-CM | POA: Diagnosis present

## 2021-04-03 DIAGNOSIS — R161 Splenomegaly, not elsewhere classified: Secondary | ICD-10-CM | POA: Diagnosis present

## 2021-04-03 DIAGNOSIS — K7031 Alcoholic cirrhosis of liver with ascites: Secondary | ICD-10-CM | POA: Diagnosis present

## 2021-04-03 DIAGNOSIS — Z9049 Acquired absence of other specified parts of digestive tract: Secondary | ICD-10-CM

## 2021-04-03 DIAGNOSIS — K766 Portal hypertension: Secondary | ICD-10-CM | POA: Diagnosis present

## 2021-04-03 DIAGNOSIS — F419 Anxiety disorder, unspecified: Secondary | ICD-10-CM | POA: Diagnosis present

## 2021-04-03 DIAGNOSIS — E871 Hypo-osmolality and hyponatremia: Secondary | ICD-10-CM | POA: Diagnosis not present

## 2021-04-03 DIAGNOSIS — M5441 Lumbago with sciatica, right side: Secondary | ICD-10-CM

## 2021-04-03 DIAGNOSIS — Z9851 Tubal ligation status: Secondary | ICD-10-CM

## 2021-04-03 DIAGNOSIS — Z87442 Personal history of urinary calculi: Secondary | ICD-10-CM

## 2021-04-03 DIAGNOSIS — R296 Repeated falls: Secondary | ICD-10-CM | POA: Diagnosis present

## 2021-04-03 DIAGNOSIS — K859 Acute pancreatitis without necrosis or infection, unspecified: Secondary | ICD-10-CM | POA: Diagnosis present

## 2021-04-03 DIAGNOSIS — R109 Unspecified abdominal pain: Secondary | ICD-10-CM | POA: Diagnosis present

## 2021-04-03 DIAGNOSIS — K7290 Hepatic failure, unspecified without coma: Secondary | ICD-10-CM | POA: Diagnosis present

## 2021-04-03 DIAGNOSIS — K860 Alcohol-induced chronic pancreatitis: Secondary | ICD-10-CM | POA: Diagnosis present

## 2021-04-03 DIAGNOSIS — I8510 Secondary esophageal varices without bleeding: Secondary | ICD-10-CM | POA: Diagnosis present

## 2021-04-03 DIAGNOSIS — D693 Immune thrombocytopenic purpura: Secondary | ICD-10-CM

## 2021-04-03 DIAGNOSIS — Z79899 Other long term (current) drug therapy: Secondary | ICD-10-CM

## 2021-04-03 DIAGNOSIS — D6959 Other secondary thrombocytopenia: Principal | ICD-10-CM | POA: Diagnosis present

## 2021-04-03 DIAGNOSIS — F1010 Alcohol abuse, uncomplicated: Secondary | ICD-10-CM | POA: Diagnosis present

## 2021-04-03 DIAGNOSIS — E876 Hypokalemia: Secondary | ICD-10-CM | POA: Diagnosis not present

## 2021-04-03 DIAGNOSIS — W19XXXA Unspecified fall, initial encounter: Secondary | ICD-10-CM | POA: Diagnosis present

## 2021-04-03 DIAGNOSIS — F259 Schizoaffective disorder, unspecified: Secondary | ICD-10-CM | POA: Diagnosis present

## 2021-04-03 DIAGNOSIS — K219 Gastro-esophageal reflux disease without esophagitis: Secondary | ICD-10-CM | POA: Diagnosis present

## 2021-04-03 DIAGNOSIS — Z811 Family history of alcohol abuse and dependence: Secondary | ICD-10-CM

## 2021-04-03 DIAGNOSIS — R569 Unspecified convulsions: Secondary | ICD-10-CM | POA: Diagnosis present

## 2021-04-03 DIAGNOSIS — F1011 Alcohol abuse, in remission: Secondary | ICD-10-CM | POA: Diagnosis present

## 2021-04-03 DIAGNOSIS — R7401 Elevation of levels of liver transaminase levels: Secondary | ICD-10-CM | POA: Diagnosis present

## 2021-04-03 DIAGNOSIS — D696 Thrombocytopenia, unspecified: Secondary | ICD-10-CM

## 2021-04-03 DIAGNOSIS — K861 Other chronic pancreatitis: Secondary | ICD-10-CM | POA: Diagnosis present

## 2021-04-03 DIAGNOSIS — M5442 Lumbago with sciatica, left side: Secondary | ICD-10-CM | POA: Diagnosis present

## 2021-04-03 DIAGNOSIS — N179 Acute kidney failure, unspecified: Secondary | ICD-10-CM

## 2021-04-03 DIAGNOSIS — Z20822 Contact with and (suspected) exposure to covid-19: Secondary | ICD-10-CM | POA: Diagnosis present

## 2021-04-03 DIAGNOSIS — B1920 Unspecified viral hepatitis C without hepatic coma: Secondary | ICD-10-CM | POA: Diagnosis present

## 2021-04-03 NOTE — Progress Notes (Signed)
Spoke with [facility] in [place] and had them set up transportation for 08/01, 08/03, 08/08, 08/10, 08/15, 08/17, 08/22, and 08/24.

## 2021-04-04 ENCOUNTER — Other Ambulatory Visit (HOSPITAL_COMMUNITY): Payer: Medicaid Other

## 2021-04-04 ENCOUNTER — Emergency Department (HOSPITAL_COMMUNITY): Payer: Medicaid Other

## 2021-04-04 ENCOUNTER — Encounter (HOSPITAL_COMMUNITY): Payer: Self-pay

## 2021-04-04 ENCOUNTER — Inpatient Hospital Stay: Payer: Medicaid Other

## 2021-04-04 DIAGNOSIS — B1920 Unspecified viral hepatitis C without hepatic coma: Secondary | ICD-10-CM | POA: Diagnosis present

## 2021-04-04 DIAGNOSIS — K860 Alcohol-induced chronic pancreatitis: Secondary | ICD-10-CM | POA: Diagnosis present

## 2021-04-04 DIAGNOSIS — N179 Acute kidney failure, unspecified: Secondary | ICD-10-CM | POA: Diagnosis present

## 2021-04-04 DIAGNOSIS — D6959 Other secondary thrombocytopenia: Secondary | ICD-10-CM | POA: Diagnosis present

## 2021-04-04 DIAGNOSIS — K859 Acute pancreatitis without necrosis or infection, unspecified: Secondary | ICD-10-CM

## 2021-04-04 DIAGNOSIS — K7031 Alcoholic cirrhosis of liver with ascites: Secondary | ICD-10-CM | POA: Diagnosis present

## 2021-04-04 DIAGNOSIS — F259 Schizoaffective disorder, unspecified: Secondary | ICD-10-CM | POA: Diagnosis present

## 2021-04-04 DIAGNOSIS — F1010 Alcohol abuse, uncomplicated: Secondary | ICD-10-CM | POA: Diagnosis not present

## 2021-04-04 DIAGNOSIS — K852 Alcohol induced acute pancreatitis without necrosis or infection: Secondary | ICD-10-CM | POA: Diagnosis present

## 2021-04-04 DIAGNOSIS — M5442 Lumbago with sciatica, left side: Secondary | ICD-10-CM | POA: Diagnosis present

## 2021-04-04 DIAGNOSIS — D696 Thrombocytopenia, unspecified: Secondary | ICD-10-CM | POA: Diagnosis present

## 2021-04-04 DIAGNOSIS — R7401 Elevation of levels of liver transaminase levels: Secondary | ICD-10-CM | POA: Diagnosis present

## 2021-04-04 DIAGNOSIS — M5127 Other intervertebral disc displacement, lumbosacral region: Secondary | ICD-10-CM | POA: Diagnosis present

## 2021-04-04 DIAGNOSIS — M5441 Lumbago with sciatica, right side: Secondary | ICD-10-CM | POA: Diagnosis present

## 2021-04-04 DIAGNOSIS — F319 Bipolar disorder, unspecified: Secondary | ICD-10-CM | POA: Diagnosis present

## 2021-04-04 DIAGNOSIS — K766 Portal hypertension: Secondary | ICD-10-CM | POA: Diagnosis present

## 2021-04-04 DIAGNOSIS — R109 Unspecified abdominal pain: Secondary | ICD-10-CM | POA: Diagnosis not present

## 2021-04-04 DIAGNOSIS — F419 Anxiety disorder, unspecified: Secondary | ICD-10-CM | POA: Diagnosis present

## 2021-04-04 DIAGNOSIS — R161 Splenomegaly, not elsewhere classified: Secondary | ICD-10-CM | POA: Diagnosis present

## 2021-04-04 DIAGNOSIS — I8510 Secondary esophageal varices without bleeding: Secondary | ICD-10-CM | POA: Diagnosis present

## 2021-04-04 DIAGNOSIS — Z87442 Personal history of urinary calculi: Secondary | ICD-10-CM | POA: Diagnosis not present

## 2021-04-04 DIAGNOSIS — R569 Unspecified convulsions: Secondary | ICD-10-CM | POA: Diagnosis present

## 2021-04-04 DIAGNOSIS — R296 Repeated falls: Secondary | ICD-10-CM | POA: Diagnosis present

## 2021-04-04 DIAGNOSIS — K7290 Hepatic failure, unspecified without coma: Secondary | ICD-10-CM | POA: Diagnosis present

## 2021-04-04 DIAGNOSIS — E871 Hypo-osmolality and hyponatremia: Secondary | ICD-10-CM | POA: Diagnosis not present

## 2021-04-04 DIAGNOSIS — D693 Immune thrombocytopenic purpura: Secondary | ICD-10-CM | POA: Diagnosis present

## 2021-04-04 DIAGNOSIS — Z20822 Contact with and (suspected) exposure to covid-19: Secondary | ICD-10-CM | POA: Diagnosis present

## 2021-04-04 DIAGNOSIS — K219 Gastro-esophageal reflux disease without esophagitis: Secondary | ICD-10-CM | POA: Diagnosis present

## 2021-04-04 HISTORY — DX: Acute kidney failure, unspecified: N17.9

## 2021-04-04 HISTORY — DX: Acute pancreatitis without necrosis or infection, unspecified: K85.90

## 2021-04-04 LAB — COMPREHENSIVE METABOLIC PANEL
ALT: 127 U/L — ABNORMAL HIGH (ref 0–44)
AST: 415 U/L — ABNORMAL HIGH (ref 15–41)
Albumin: 3.4 g/dL — ABNORMAL LOW (ref 3.5–5.0)
Alkaline Phosphatase: 175 U/L — ABNORMAL HIGH (ref 38–126)
Anion gap: 15 (ref 5–15)
BUN: 24 mg/dL — ABNORMAL HIGH (ref 6–20)
CO2: 24 mmol/L (ref 22–32)
Calcium: 8.9 mg/dL (ref 8.9–10.3)
Chloride: 101 mmol/L (ref 98–111)
Creatinine, Ser: 1.28 mg/dL — ABNORMAL HIGH (ref 0.44–1.00)
GFR, Estimated: 52 mL/min — ABNORMAL LOW (ref >60)
Glucose, Bld: 97 mg/dL (ref 70–99)
Potassium: 4.4 mmol/L (ref 3.5–5.1)
Sodium: 140 mmol/L (ref 135–145)
Total Bilirubin: 4.3 mg/dL — ABNORMAL HIGH (ref 0.3–1.2)
Total Protein: 7.7 g/dL (ref 6.5–8.1)

## 2021-04-04 LAB — CBC WITH DIFFERENTIAL/PLATELET
Abs Immature Granulocytes: 0.01 10*3/uL (ref 0.00–0.07)
Basophils Absolute: 0 10*3/uL (ref 0.0–0.1)
Basophils Relative: 1 %
Eosinophils Absolute: 0.3 10*3/uL (ref 0.0–0.5)
Eosinophils Relative: 6 %
HCT: 42.5 % (ref 36.0–46.0)
Hemoglobin: 13.8 g/dL (ref 12.0–15.0)
Immature Granulocytes: 0 %
Lymphocytes Relative: 17 %
Lymphs Abs: 0.7 10*3/uL (ref 0.7–4.0)
MCH: 30.1 pg (ref 26.0–34.0)
MCHC: 32.5 g/dL (ref 30.0–36.0)
MCV: 92.8 fL (ref 80.0–100.0)
Monocytes Absolute: 0.7 10*3/uL (ref 0.1–1.0)
Monocytes Relative: 17 %
Neutro Abs: 2.6 10*3/uL (ref 1.7–7.7)
Neutrophils Relative %: 59 %
Platelets: 24 10*3/uL — CL (ref 150–400)
RBC: 4.58 MIL/uL (ref 3.87–5.11)
RDW: 17.4 % — ABNORMAL HIGH (ref 11.5–15.5)
WBC: 4.3 10*3/uL (ref 4.0–10.5)
nRBC: 0 % (ref 0.0–0.2)

## 2021-04-04 LAB — CK: Total CK: 6308 U/L — ABNORMAL HIGH (ref 38–234)

## 2021-04-04 LAB — URINALYSIS, ROUTINE W REFLEX MICROSCOPIC
Bilirubin Urine: NEGATIVE
Glucose, UA: NEGATIVE mg/dL
Ketones, ur: 5 mg/dL — AB
Nitrite: NEGATIVE
Protein, ur: NEGATIVE mg/dL
Specific Gravity, Urine: 1.017 (ref 1.005–1.030)
pH: 5 (ref 5.0–8.0)

## 2021-04-04 LAB — RESP PANEL BY RT-PCR (FLU A&B, COVID) ARPGX2
Influenza A by PCR: NEGATIVE
Influenza B by PCR: NEGATIVE
SARS Coronavirus 2 by RT PCR: NEGATIVE

## 2021-04-04 LAB — MAGNESIUM: Magnesium: 1.7 mg/dL (ref 1.7–2.4)

## 2021-04-04 LAB — LIPASE, BLOOD: Lipase: 724 U/L — ABNORMAL HIGH (ref 11–51)

## 2021-04-04 LAB — PROTIME-INR
INR: 1.3 — ABNORMAL HIGH (ref 0.8–1.2)
Prothrombin Time: 16.3 seconds — ABNORMAL HIGH (ref 11.4–15.2)

## 2021-04-04 MED ORDER — FLUOXETINE HCL 40 MG PO CAPS: 40.0000 mg | ORAL_CAPSULE | Freq: Every day | ORAL | Status: DC

## 2021-04-04 MED ORDER — OXYCODONE HCL 5 MG PO TABS
5.0000 mg | ORAL_TABLET | Freq: Once | ORAL | Status: AC
Start: 2021-04-04 — End: 2021-04-04
  Administered 2021-04-04: 5 mg via ORAL
  Filled 2021-04-04: qty 1

## 2021-04-04 MED ORDER — ARIPIPRAZOLE 10 MG PO TABS: 10.0000 mg | ORAL_TABLET | Freq: Every day | ORAL | Status: DC

## 2021-04-04 MED ORDER — MORPHINE SULFATE (PF) 2 MG/ML IV SOLN
2.0000 mg | INTRAVENOUS | Status: DC | PRN
Start: 2021-04-04 — End: 2021-04-05
  Administered 2021-04-04 – 2021-04-05 (×6): 2 mg via INTRAVENOUS
  Filled 2021-04-04 (×6): qty 1

## 2021-04-04 MED ORDER — LACTATED RINGERS IV SOLN: INTRAVENOUS | Status: DC

## 2021-04-04 MED ORDER — ADULT MULTIVITAMIN W/MINERALS CH
1.0000 | ORAL_TABLET | Freq: Every day | ORAL | Status: DC
  Administered 2021-04-05 – 2021-04-08 (×4): 1 via ORAL
  Filled 2021-04-04 (×4): qty 1

## 2021-04-04 MED ORDER — THIAMINE HCL 100 MG/ML IJ SOLN
Freq: Once | INTRAVENOUS | Status: AC
  Filled 2021-04-04: qty 1000

## 2021-04-04 MED ORDER — LEVETIRACETAM 500 MG PO TABS
750.0000 mg | ORAL_TABLET | Freq: Two times a day (BID) | ORAL | Status: DC
  Administered 2021-04-04 – 2021-04-08 (×8): 750 mg via ORAL
  Filled 2021-04-04 (×8): qty 1

## 2021-04-04 MED ORDER — ACETAMINOPHEN 500 MG PO TABS
1000.0000 mg | ORAL_TABLET | Freq: Once | ORAL | Status: AC
  Administered 2021-04-04: 1000 mg via ORAL
  Filled 2021-04-04: qty 2

## 2021-04-04 MED ORDER — IOHEXOL 350 MG/ML SOLN
100.0000 mL | Freq: Once | INTRAVENOUS | Status: AC | PRN
  Administered 2021-04-04: 80 mL via INTRAVENOUS

## 2021-04-04 MED ORDER — LORAZEPAM 1 MG PO TABS
0.0000 mg | ORAL_TABLET | Freq: Two times a day (BID) | ORAL | Status: AC
Start: 2021-04-06 — End: 2021-04-08
  Administered 2021-04-07 (×2): 1 mg via ORAL
  Filled 2021-04-04: qty 1

## 2021-04-04 MED ORDER — ONDANSETRON HCL 4 MG/2ML IJ SOLN: 4.0000 mg | Freq: Four times a day (QID) | INTRAMUSCULAR | Status: DC | PRN

## 2021-04-04 MED ORDER — CHLORDIAZEPOXIDE HCL 25 MG PO CAPS
100.0000 mg | ORAL_CAPSULE | Freq: Once | ORAL | Status: AC
  Administered 2021-04-04: 100 mg via ORAL
  Filled 2021-04-04: qty 4

## 2021-04-04 MED ORDER — LORAZEPAM 1 MG PO TABS
1.0000 mg | ORAL_TABLET | ORAL | Status: AC | PRN
  Administered 2021-04-04 – 2021-04-05 (×2): 1 mg via ORAL
  Filled 2021-04-04 (×3): qty 1

## 2021-04-04 MED ORDER — LORAZEPAM 2 MG/ML IJ SOLN: 1.0000 mg | INTRAMUSCULAR | Status: AC | PRN

## 2021-04-04 MED ORDER — MIRTAZAPINE 30 MG PO TABS: 30.0000 mg | ORAL_TABLET | Freq: Every day | ORAL | Status: DC

## 2021-04-04 MED ORDER — GABAPENTIN 300 MG PO CAPS
300.0000 mg | ORAL_CAPSULE | Freq: Three times a day (TID) | ORAL | Status: DC
  Administered 2021-04-04 – 2021-04-08 (×12): 300 mg via ORAL
  Filled 2021-04-04 (×12): qty 1

## 2021-04-04 MED ORDER — LORAZEPAM 2 MG/ML IJ SOLN
1.0000 mg | Freq: Once | INTRAMUSCULAR | Status: AC
  Administered 2021-04-04: 1 mg via INTRAVENOUS
  Filled 2021-04-04: qty 1

## 2021-04-04 MED ORDER — ONDANSETRON HCL 4 MG PO TABS: 4.0000 mg | ORAL_TABLET | Freq: Four times a day (QID) | ORAL | Status: DC | PRN

## 2021-04-04 MED ORDER — SODIUM CHLORIDE 0.9 % IV SOLN: INTRAVENOUS | Status: DC

## 2021-04-04 MED ORDER — LACTULOSE 10 GM/15ML PO SOLN
20.0000 g | Freq: Three times a day (TID) | ORAL | Status: DC
  Administered 2021-04-04 – 2021-04-08 (×12): 20 g via ORAL
  Filled 2021-04-04 (×13): qty 30

## 2021-04-04 MED ORDER — LORAZEPAM 1 MG PO TABS
0.0000 mg | ORAL_TABLET | Freq: Four times a day (QID) | ORAL | Status: AC
  Administered 2021-04-05 – 2021-04-06 (×2): 1 mg via ORAL
  Filled 2021-04-04 (×2): qty 1

## 2021-04-04 NOTE — Consult Note (Signed)
[place] [phone]   [phone]  CONSULT NOTE  REFERRING PHYSICIAN: Dr. Royce Macadamia Agbata  REASON FOR CONSULTATION:  50 years old white female with thrombocytopenia.  HPI Briana Sweeney is a 50 y.o. female with past medical history significant for alcohol abuse, bipolar disorder, and anxiety, depression, GERD, hepatitis C, heroin abuse, kidney stones as well as seizure disorder, schizophrenia and thrombocytopenia.  The patient presented to the hospital today complaining of left flank pain for several days.  She is also complaining of pain all over and she was on Klonopin by her previous psychiatrist who retired and she has not established care with another physician.  The patient had significant history of alcohol abuse and she has been drinking a pint of vodka as well as 6 beers on daily basis.  She has persistent history of thrombocytopenia for several years secondary to her alcohol abuse as well as liver cirrhosis and splenomegaly.  The patient is followed by Dr. [name] at the [facility] and she has been receiving weekly doses of Nplate.  She missed her appointment for the last 2 weeks.  She was noted during her admission to have significant decline in her platelet count and I was consulted to evaluate the patient for this problem.  She had CT scan of the abdomen pelvis performed today and it showed cirrhosis and stigmata of portal venous hypertension including paraesophageal varices, splenomegaly as well as abdominal varices but no ascites.  She also has chronic cholelithiasis and chronic calcified pancreatitis. I reviewed her peripheral blood smear and there is no evidence for schistocytes or any other abnormality except for the low platelets and some of them are large platelets.  The patient has no current bleeding, bruises or ecchymosis. Family history significant for mother with lung cancer and father had throat cancer. The patient is  single and has 2 children a son and daughter.  She used to work as a Theme park manager.  She denied having any history of smoking but has current history of alcohol abuse as well as drug abuse.   HPI  Past Medical History:  Diagnosis Date   Alcohol use    Anxiety    Bipolar affective disorder ([place])    With anxiety features   Bunionette of right foot 11/2019   Cirrhosis of liver (HCC)    Due to alcohol and hepatitis C   Depression    Esophageal 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 (HCC)    ETOHism (HCC)    GERD (gastroesophageal reflux disease)    Hematemesis 02/10/2018   Hepatitis C 2018   hepatitis c and alcohol related hepatitis   Heroin abuse ([place])    History of blood transfusion    "blood doesn't clot; I fell down and had to have a transfusion"   History of kidney stones    History of thrombocytopenia    Menopause 2016   Migraine    "when I get really stressed" (09/01/2017)   S/P foot surgery, right 11/2019   Schizophrenia ([place])    Seizures ([place])    "when I run out of my RX; lots recently" (09/01/2017)   Thrombocytopenia ([place])    Urinary urgency 05/2020    Past Surgical History:  Procedure Laterality Date   BUNIONECTOMY     ESOPHAGOGASTRODUODENOSCOPY N/A 09/03/2017   Procedure: ESOPHAGOGASTRODUODENOSCOPY (EGD);  Surgeon: Doran Stabler, MD;  Location: [facility];  Service: Gastroenterology;  Laterality: N/A;   FINGER FRACTURE SURGERY Left    "shattered my  pinky"   FRACTURE SURGERY     I & D EXTREMITY Left 09/18/2018   Procedure: IRRIGATION AND DEBRIDEMENT EXTREMITY;  Surgeon: Leanora Cover, MD;  Location: [facility];  Service: Orthopedics;  Laterality: Left;   IR PARACENTESIS  07/23/2017   IR PARACENTESIS  07/2017   "did it twice in the same week" (09/01/2017)   SHOULDER OPEN ROTATOR CUFF REPAIR Right    TOOTH EXTRACTION  08/2019   TUBAL LIGATION     VAGINAL HYSTERECTOMY      Family History  Problem Relation Age of Onset   Lung cancer Mother 79   Alcohol abuse Mother     Throat cancer Father 32    Social History Social History   Tobacco Use   Smoking status: Never   Smokeless tobacco: Never  Vaping Use   Vaping Use: Never used  Substance Use Topics   Alcohol use: Not Currently    Alcohol/week: 6.0 standard drinks    Types: 6 Cans of beer per week    Comment: weekly   Drug use: Not Currently    Allergies  Allergen Reactions   Other Other (See Comments)    Platelets: Rx chest pain, tremors, body aches    Current Facility-Administered Medications  Medication Dose Route Frequency Provider Last Rate Last Admin   gabapentin (NEURONTIN) capsule 300 mg  300 mg Oral TID Agbata, Tochukwu, MD       lactulose (CHRONULAC) 10 GM/15ML solution 20 g  20 g Oral TID Agbata, Tochukwu, MD       levETIRAcetam (KEPPRA) tablet 750 mg  750 mg Oral BID Agbata, Tochukwu, MD       LORazepam (ATIVAN) tablet 1-4 mg  1-4 mg Oral Q1H PRN Agbata, Tochukwu, MD       Or   LORazepam (ATIVAN) injection 1-4 mg  1-4 mg Intravenous Q1H PRN Agbata, Tochukwu, MD       LORazepam (ATIVAN) tablet 0-4 mg  0-4 mg Oral Q6H Agbata, Tochukwu, MD       Followed by   Derrill Memo ON 04/06/2021] LORazepam (ATIVAN) tablet 0-4 mg  0-4 mg Oral Q12H Agbata, Tochukwu, MD       morphine 2 MG/ML injection 2 mg  2 mg Intravenous Q4H PRN Agbata, Tochukwu, MD   2 mg at 04/04/21 1636   [START ON 04/05/2021] multivitamin with minerals tablet 1 tablet  1 tablet Oral Daily Agbata, Tochukwu, MD       ondansetron (ZOFRAN) tablet 4 mg  4 mg Oral Q6H PRN Agbata, Tochukwu, MD       Or   ondansetron (ZOFRAN) injection 4 mg  4 mg Intravenous Q6H PRN Agbata, Tochukwu, MD        Review of Systems  Constitutional: positive for fatigue Eyes: negative Ears, nose, mouth, throat, and face: negative Respiratory: negative Cardiovascular: negative Gastrointestinal: negative Genitourinary:positive for dysuria Integument/breast: negative Hematologic/lymphatic: negative Musculoskeletal:positive for left flank  pain Neurological: negative Behavioral/Psych: positive for anxiety, depression, and illegal drug usage Endocrine: negative Allergic/Immunologic: negative  Physical Exam  [id number], healthy, no distress, well nourished, and well developed SKIN: skin color, texture, turgor are normal, no rashes or significant lesions HEAD: Normocephalic, No masses, lesions, tenderness or abnormalities EYES: normal, PERRLA, Conjunctiva are pink and non-injected EARS: External ears normal, Canals clear OROPHARYNX:no exudate, no erythema, and lips, buccal mucosa, and tongue normal  NECK: supple, no adenopathy, no JVD LYMPH:  no palpable lymphadenopathy, no hepatosplenomegaly BREAST:not examined LUNGS: clear to auscultation , and palpation HEART: regular rate & rhythm,  no murmurs, and no gallops ABDOMEN:abdomen soft and non-tender BACK: Back symmetric, no curvature., No CVA tenderness EXTREMITIES:no joint deformities, effusion, or inflammation, no edema  NEURO: alert & oriented x 3 with fluent speech, no focal motor/sensory deficits  PERFORMANCE STATUS: ECOG 1  LABORATORY DATA: Lab Results  Component Value Date   WBC 4.3 04/04/2021   HGB 13.8 04/04/2021   HCT 42.5 04/04/2021   MCV 92.8 04/04/2021   PLT 24 (LL) 04/04/2021    '@LASTCHEM'$ @  RADIOGRAPHIC STUDIES: CT ABDOMEN PELVIS WO CONTRAST  Result Date: 04/04/2021 CLINICAL DATA:  50 year old female with low back and flank pain. Thrombocytopenia. Hepatitis C, cirrhosis. EXAM: CT ABDOMEN AND PELVIS WITHOUT CONTRAST TECHNIQUE: Multidetector CT imaging of the abdomen and pelvis was performed following the standard protocol without IV contrast. COMPARISON:  CT Abdomen and Pelvis 12/23/2020 and earlier. FINDINGS: Lower chest: No cardiomegaly or pericardial effusion. Increased posterior mediastinal soft tissue appears associated with the descending thoracic esophagus, and discrete esophageal varices were noted in that region on the April CT with contrast.  Lower lung volumes with increased lung base atelectasis compared to April. No pleural effusion. Hepatobiliary: Pronounced hepatic steatosis. Nodular liver contour. Small caliber perihepatic varices. No discrete liver lesion in the absence of contrast. 2.2 cm gallstone. Gallbladder appears stable with mild wall thickening. Pancreas: Dystrophic calcifications at the pancreatic head and uncinate are stable. Spleen: Mild splenomegaly. Estimated splenic volume 421 mL (normal splenic volume range 83 - 412 mL). Perisplenic varices. Occasional small dystrophic calcifications in the spleen are stable. Adrenals/Urinary Tract: Normal adrenal glands. Small volume of contrast in nondilated renal collecting systems. Otherwise negative noncontrast kidneys. No hydroureter. Unremarkable bladder. Chronic pelvic phleboliths. Stomach/Bowel: No dilated large or small bowel. Redundant large bowel with mild to moderate retained stool throughout. Occasional large bowel diverticula. Normal appendix on coronal image 52. Negative terminal ileum. No small bowel inflammation is evident. Gastrohepatic ligament and gastrosplenic ligament varices. Otherwise negative noncontrast stomach and duodenum. No free air. No free fluid. Vascular/Lymphatic: Normal caliber abdominal aorta. Mild Calcified aortic atherosclerosis. Gastrohepatic ligament and gastrosplenic ligament varices. Vascular patency is not evaluated in the absence of IV contrast. No lymphadenopathy. Reproductive: Negative. Other: No pelvic free fluid. Musculoskeletal: No acute osseous abnormality identified. IMPRESSION: 1. Cirrhosis and stigmata of portal venous hypertension including paraesophageal varices, splenomegaly, abdominal varices. No ascites. 2. No superimposed acute or inflammatory process identified in the non-contrast abdomen or pelvis. 3. Chronic cholelithiasis.  Chronic calcific pancreatitis. 4. Lower lung volumes with atelectasis. Electronically Signed   By: Genevie Ann M.D.    On: 04/04/2021 06:18   DG Lumbar Spine Complete  Result Date: 04/04/2021 CLINICAL DATA:  Fall, back pain EXAM: LUMBAR SPINE - COMPLETE 4+ VIEW COMPARISON:  Radiograph 10/14/2017, CT 09/27/2020 FINDINGS: Five lumbar type vertebrae. No visible acute fracture or vertebral body height loss. No static or traumatic listhesis or discernible spondylolysis. Minimal discogenic and facet degenerative changes, maximal towards the lower lumbar levels. Included bones of the pelvis appear intact and congruent with contiguity of the sacral arcs. Few non contiguous borderline distended air-filled loops of small bowel in the mid abdomen, without frank high-grade obstructive pattern. Correlate for abdominal symptoms. Remaining soft tissues are free of acute abnormality. IMPRESSION: No acute fracture or traumatic listhesis. Mild discogenic and facet degenerative changes. Few borderline air distended loops of small bowel in the mid abdomen are noncontiguous without frank high-grade obstructive pattern. Could correlate for abdominal symptoms. Electronically Signed   By: Lovena Le M.D.   On: 04/04/2021  02:44   CT Head Wo Contrast  Result Date: 04/04/2021 CLINICAL DATA:  50 year old female with dizziness and multiple falls over the past few days. EXAM: CT HEAD WITHOUT CONTRAST TECHNIQUE: Contiguous axial images were obtained from the base of the skull through the vertex without intravenous contrast. COMPARISON:  Brain MRI 04/07/2019.  Head CT 03/29/2021. FINDINGS: Brain: Stable non contrast CT appearance of the brain. No midline shift, ventriculomegaly, mass effect, evidence of mass lesion, intracranial hemorrhage or evidence of cortically based acute infarction. Preserved gray-white matter differentiation. Small dystrophic calcification in the left cerebellar hemisphere on series 2, image 7 is unchanged since 2019. Vascular: No suspicious intracranial vascular hyperdensity. Skull: Stable and intact. Sinuses/Orbits: Visualized  paranasal sinuses and mastoids are stable and well aerated. Tympanic cavities are clear. Other: Visualized orbits and scalp soft tissues are within normal limits. IMPRESSION: No acute intracranial abnormality. Stable age advanced cerebral volume loss and small dystrophic calcification of the left cerebellum. Electronically Signed   By: Genevie Ann M.D.   On: 04/04/2021 06:11    ASSESSMENT: This is a 50 years old white female with multiple medical problems and history of alcohol abuse, liver cirrhosis and hepatitis C as well as splenomegaly presented with left flank pain and was found to have worsening thrombocytopenia which is likely secondary to her alcohol abuse and liver cirrhosis. The patient has been followed by Dr. [name] at [facility] and receiving weekly treatment with Nplate.   PLAN: I had a lengthy discussion with the patient today about her current condition and treatment options.  I personally reviewed the blood smear and I do not see any other concerning findings to indicate TTP or other etiology for her thrombocytopenia. Her thrombocytopenia is likely secondary to her other comorbidities including the liver cirrhosis and alcohol abuse with splenomegaly. The patient is currently asymptomatic and I do not see an urgent need to give her any transfusion or other treatment at this point except for probably Nplate if she continues to have more decline in her platelets count less than 20,000. The patient will follow-up with her primary hematologist at [place], Dr. [name] after discharge. Will continue to follow the patient with you and assist in her management on as-needed basis. The patient voices understanding of current disease status and treatment options and is in agreement with the current care plan.  All questions were answered. The patient knows to call the clinic with any problems, questions or concerns. We can certainly see the patient much sooner if  necessary.  Thank you so much for allowing me to participate in the care of [place]. I will continue to follow up the patient with you and assist in her care.   Disclaimer: This note was dictated with voice recognition software. Similar sounding words can inadvertently be transcribed and may not be corrected upon review.   Briana Sweeney April 04, 2021, 5:42 PM

## 2021-04-04 NOTE — ED Provider Notes (Signed)
[facility] DEPT Provider Note   CSN: [id number] Arrival date & time: 04/03/21  2144     History Chief Complaint  Patient presents with   Flank Pain    Briana Sweeney is a 50 y.o. female.  50 yo F with chief complaints of left low back pain that radiates down the leg.  This been going on for a few days.  Patient has been falling frequently recently.  She unfortunately has a history of alcoholic cirrhosis and continues to drink.  She feels like she has some tingling to the leg off and on.  Denies loss of bowel or bladder denies loss of rectal sensation.  Denies fevers.  Denies abdominal pain.  The history is provided by the patient.  Flank Pain This is a new problem. The current episode started 2 days ago. The problem occurs constantly. The problem has not changed since onset.Pertinent negatives include no chest pain, no headaches and no shortness of breath. Nothing aggravates the symptoms. Nothing relieves the symptoms. She has tried nothing for the symptoms. The treatment provided no relief.      Past Medical History:  Diagnosis Date   Alcohol use    Anxiety    Bipolar affective disorder ([place])    With anxiety features   Bunionette of right foot 11/2019   Cirrhosis of liver (HCC)    Due to alcohol and hepatitis C   Depression    Esophageal 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 (HCC)    ETOHism (HCC)    GERD (gastroesophageal reflux disease)    Hematemesis 02/10/2018   Hepatitis C 2018   hepatitis c and alcohol related hepatitis   Heroin abuse ([place])    History of blood transfusion    "blood doesn't clot; I fell down and had to have a transfusion"   History of kidney stones    History of thrombocytopenia    Menopause 2016   Migraine    "when I get really stressed" (09/01/2017)   S/P foot surgery, right 11/2019   Schizophrenia ([place])    Seizures ([place])    "when I run out of my RX; lots recently" (09/01/2017)   Thrombocytopenia ([place])    Urinary urgency  05/2020    Patient Active Problem List   Diagnosis Date Noted   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   Episodic mood disorder ([place]) 08/08/2020   Left leg pain 03/28/2020   History of recent fall 03/28/2020   Abnormal urine 12/13/2019   History of alcohol abuse 09/07/2019   Insomnia 05/05/2019   Dizziness 04/02/2019   Bruising 04/02/2019   Hyponatremia 04/02/2019   Head injury    Scalp laceration, initial encounter    Fall 01/20/2019   Epistaxis 01/20/2019   Altered mental status    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[place]) 12/05/2018   Other mixed anxiety disorders 10/27/2018   Neuropathy 10/27/2018   Abscess of bursa of left elbow 09/23/2018   Olecranon bursitis of left elbow    Erysipelas [id number]   Acute metabolic encephalopathy 0000000   Overdose 08/22/2018   Hypotension 08/22/2018   Seizure ([place]) 08/22/2018   Substance induced mood disorder ([place]) 07/17/2018   Cocaine dependence without complication ([place]) [id number]   Polysubstance abuse ([place]) 04/08/2018   Encephalopathy, 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[place]) 04/08/2018   Hepatitis C 03/18/2018   Portal hypertension ([place]) [id number]   Alcoholic intoxication without complication ([place]) [id number]   Depression 03/18/2018   Upper GI bleed 02/10/2018   Alcohol use disorder, severe, in early remission ([place]) 02/10/2018  Chronic anemia    Thrombocytopenia due to drugs    Suicidal ideation    Thrombocytopenia (HCC) 01/02/2018   GERD (gastroesophageal reflux disease) 11/01/2017   Trichimoniasis 10/30/2017   Alcohol dependence ([place]) 10/29/2017   Acute hyperactive alcohol withdrawal delirium ([place]) 10/09/2017   Alcohol abuse with alcohol-induced mood disorder ([place])    Alcohol withdrawal syndrome with complication ([place]) [id number]   Esophageal varices without bleeding ([place])    Hematemesis 09/01/2017   Ascites due to alcoholic cirrhosis (HCC)    Decompensated hepatic cirrhosis ([place]) 07/23/2017   Alcohol abuse 07/23/2017    Hepatitis C antibody positive in blood 07/23/2017   Hypokalemia 07/23/2017   Jaundice 07/23/2017   Coagulopathy ([place]) 07/23/2017   Hypomagnesemia 07/23/2017   Pancytopenia ([place]) [id number]   Alcoholic cirrhosis of liver with ascites ([place]) 07/23/2017   Bacterial vaginosis 06/03/2017   UTI (urinary tract infection) 06/02/2017    Past Surgical History:  Procedure Laterality Date   BUNIONECTOMY     ESOPHAGOGASTRODUODENOSCOPY N/A 09/03/2017   Procedure: ESOPHAGOGASTRODUODENOSCOPY (EGD);  Surgeon: Doran Stabler, MD;  Location: [facility];  Service: Gastroenterology;  Laterality: N/A;   FINGER FRACTURE SURGERY Left    "shattered my pinky"   FRACTURE SURGERY     I & D EXTREMITY Left 09/18/2018   Procedure: IRRIGATION AND DEBRIDEMENT EXTREMITY;  Surgeon: Leanora Cover, MD;  Location: [facility];  Service: Orthopedics;  Laterality: Left;   IR PARACENTESIS  07/23/2017   IR PARACENTESIS  07/2017   "did it twice in the same week" (09/01/2017)   SHOULDER OPEN ROTATOR CUFF REPAIR Right    TOOTH EXTRACTION  08/2019   TUBAL LIGATION     VAGINAL HYSTERECTOMY       OB History     Gravida  3   Para      Term      Preterm      AB      Living  2      SAB      IAB      Ectopic      Multiple      Live Births              Family History  Problem Relation Age of Onset   Lung cancer Mother 79   Alcohol abuse Mother    Throat cancer Father 43    Social History   Tobacco Use   Smoking status: Never   Smokeless tobacco: Never  Vaping Use   Vaping Use: Never used  Substance Use Topics   Alcohol use: Not Currently    Alcohol/week: 6.0 standard drinks    Types: 6 Cans of beer per week    Comment: weekly   Drug use: Not Currently    Home Medications Prior to Admission medications   Medication Sig Start Date End Date Taking? Authorizing Provider  ARIPiprazole (ABILIFY) 10 MG tablet TAKE 1 TABLET BY MOUTH DAILY 01/30/21 01/30/22  Cameron Sprang, MD  ARIPiprazole  (ABILIFY) 10 MG tablet Take 1 tablet by mouth daily 02/15/21     clonazePAM (KLONOPIN) 2 MG tablet TAKE 1 TABLET BY MOUTH 2 TIMES DAILY AS NEEDED FOR ANXIETY 11/08/20 05/07/21  Pucilowski, Marchia Bond, MD  diclofenac Sodium (VOLTAREN) 1 % GEL Apply 4 g topically 4 (four) times daily. 05/26/20   [name], FNP  famotidine (PEPCID) 20 MG tablet TAKE 1 TABLET BY MOUTH DAILY 10/20/20 10/20/21  [name], FNP  famotidine (PEPCID) 20 MG tablet Take 1 tablet  by mouth daily 02/15/21     FLUoxetine (PROZAC) 40 MG capsule TAKE 1 CAPSULE BY MOUTH DAILY 01/30/21 01/30/22  Cameron Sprang, MD  FLUoxetine (PROZAC) 40 MG capsule Take 1 capsule (40 mg total) by mouth daily. 02/15/21     furosemide (LASIX) 40 MG tablet TAKE 1 TABLET BY MOUTH DAILY. 10/20/20 10/20/21  [name], FNP  gabapentin (NEURONTIN) 300 MG capsule TAKE 2 CAPSULES BY MOUTH 3 TIMES DAILY. NEED OFFICE VISIT FOR MORE REFILLS. 02/06/21 02/06/22  Tresa Garter, MD  lactulose (CHRONULAC) 10 GM/15ML solution Take 30 mLs (20 g total) by mouth 3 (three) times daily. Goal to have 2-3 BMs daily 05/10/20   Volanda Napoleon, MD  levETIRAcetam (KEPPRA) 750 MG tablet Take 1 tablet (750 mg total) by mouth 2 (two) times daily. 01/30/21   Cameron Sprang, MD  mirtazapine (REMERON) 30 MG tablet TAKE 1 TABLET BY MOUTH AT BEDTIME 11/08/20 11/08/21  Pucilowski, Marchia Bond, MD  Multiple Vitamin (MULTIVITAMIN WITH MINERALS) TABS tablet Take 1 tablet by mouth daily. 04/13/19   Hosie Poisson, MD    Allergies    Other  Review of Systems   Review of Systems  Constitutional:  Negative for chills and fever.  HENT:  Negative for congestion and rhinorrhea.   Eyes:  Negative for redness and visual disturbance.  Respiratory:  Negative for shortness of breath and wheezing.   Cardiovascular:  Negative for chest pain and palpitations.  Gastrointestinal:  Negative for nausea and vomiting.  Genitourinary:  Positive for flank pain. Negative for dysuria and urgency.   Musculoskeletal:  Negative for arthralgias and myalgias.  Skin:  Negative for pallor and wound.  Neurological:  Negative for dizziness and headaches.   Physical Exam Updated Vital Signs BP 104/75 (BP Location: Right Arm)   Pulse (!) 101   Temp 97.9 F (36.6 C) (Oral)   Resp 16   Ht 5' (1.524 m)   Wt 63.5 kg   SpO2 100%   BMI 27.34 kg/m   Physical Exam Vitals and nursing note reviewed.  Constitutional:      General: She is not in acute distress.    Appearance: She is well-developed. She is not diaphoretic.  HENT:     Head: Normocephalic and atraumatic.  Eyes:     General: Scleral icterus present.     Pupils: Pupils are equal, round, and reactive to light.  Cardiovascular:     Rate and Rhythm: Normal rate and regular rhythm.     Heart sounds: No murmur heard.   No friction rub. No gallop.  Pulmonary:     Effort: Pulmonary effort is normal.     Breath sounds: No wheezing or rales.  Abdominal:     General: There is distension.     Palpations: Abdomen is soft.     Tenderness: There is abdominal tenderness.  Musculoskeletal:        General: No tenderness.     Cervical back: Normal range of motion and neck supple.     Comments: No midline spinal step-offs or deformities.  Pulse motor and sensation intact in bilateral lower extremities.  No obvious weakness.  Reflexes are 1+ bilaterally.  Skin:    General: Skin is warm and dry.  Neurological:     Mental Status: She is alert and oriented to person, place, and time.  Psychiatric:        Behavior: Behavior normal.    ED Results / Procedures / Treatments   Labs (all labs  ordered are listed, but only abnormal results are displayed) Labs Reviewed  COMPREHENSIVE METABOLIC PANEL  LIPASE, BLOOD  CBC WITH DIFFERENTIAL/PLATELET  URINALYSIS, ROUTINE W REFLEX MICROSCOPIC  PROTIME-INR    EKG None  Radiology No results found.  Procedures Procedures   Medications Ordered in ED Medications - No data to display  ED  Course  I have reviewed the triage vital signs and the nursing notes.  Pertinent labs & imaging results that were available during my care of the patient were reviewed by me and considered in my medical decision making (see chart for details).    MDM Rules/Calculators/A&P                           50 yo F with chief complaints of left-sided low back pain after a fall.  Been going on for couple days.  Feels like the pain shoots down her leg.  Patient has a history of alcoholic cirrhosis.  Will obtain a laboratory evaluation plain film CT imaging reassess.  CT scan without obvious acute finding.  Patient's LFTs and lipase are all elevated.  Patient complaining more of pain to her low back.  I did discuss the case with Dr. Paulita Fujita, GI did feel reasonable for medical admission.  Will discuss with hospitalist.  CRITICAL CARE Performed by: Cecilio Asper   Total critical care time: 35 minutes  Critical care time was exclusive of separately billable procedures and treating other patients.  Critical care was necessary to treat or prevent imminent or life-threatening deterioration.  Critical care was time spent personally by me on the following activities: development of treatment plan with patient and/or surrogate as well as nursing, discussions with consultants, evaluation of patient's response to treatment, examination of patient, obtaining history from patient or surrogate, ordering and performing treatments and interventions, ordering and review of laboratory studies, ordering and review of radiographic studies, pulse oximetry and re-evaluation of patient's condition.  The patients results and plan were reviewed and discussed.   Any x-rays performed were independently reviewed by myself.   Differential diagnosis were considered with the presenting HPI.  Medications  morphine 2 MG/ML injection 2 mg (2 mg Intravenous Given 04/04/21 2153)  lactulose (CHRONULAC) 10 GM/15ML solution 20 g  (20 g Oral Given 04/04/21 2037)  gabapentin (NEURONTIN) capsule 300 mg (300 mg Oral Given 04/04/21 2038)  levETIRAcetam (KEPPRA) tablet 750 mg (750 mg Oral Given 04/04/21 1811)  multivitamin with minerals tablet 1 tablet (has no administration in time range)  ondansetron (ZOFRAN) tablet 4 mg (has no administration in time range)    Or  ondansetron (ZOFRAN) injection 4 mg (has no administration in time range)  LORazepam (ATIVAN) tablet 1-4 mg (1 mg Oral Given 04/04/21 2038)    Or  LORazepam (ATIVAN) injection 1-4 mg ( Intravenous See Alternative 04/04/21 2038)  LORazepam (ATIVAN) tablet 0-4 mg (0 mg Oral Not Given 04/04/21 1944)    Followed by  LORazepam (ATIVAN) tablet 0-4 mg (has no administration in time range)  acetaminophen (TYLENOL) tablet 1,000 mg (1,000 mg Oral Given 04/04/21 0318)  oxyCODONE (Oxy IR/ROXICODONE) immediate release tablet 5 mg (5 mg Oral Given 04/04/21 0319)  iohexol (OMNIPAQUE) 350 MG/ML injection 100 mL (80 mLs Intravenous Contrast Given 04/04/21 0439)  LORazepam (ATIVAN) injection 1 mg (1 mg Intravenous Given 04/04/21 0836)  chlordiazePOXIDE (LIBRIUM) capsule 100 mg (100 mg Oral Given 04/04/21 0813)  dextrose 5 % and 0.45% NaCl 1,000 mL with thiamine 100 mg,  folic acid 1 mg, M.V.I. Adult 10 mL infusion ( Intravenous New Bag/Given 04/04/21 1240)    Vitals:   04/04/21 1500 04/04/21 1712 04/04/21 1943 04/04/21 2028  BP: 98/79 126/87  128/82  Pulse: 100 82 97 95  Resp: '20 15  20  '$ Temp: 98.5 F (36.9 C) 97.9 F (36.6 C)  98.1 F (36.7 C)  TempSrc:  Oral  Oral  SpO2: 99% 98%  98%  Weight:      Height:        Final diagnoses:  Alcohol-induced acute pancreatitis, unspecified complication status  Thrombocytopenia (HCC)    Admission/ observation were discussed with the admitting physician, patient and/or family and they are comfortable with the plan.    Final Clinical Impression(s) / ED Diagnoses Final diagnoses:  None    Rx / DC Orders ED Discharge Orders      None        Deno Etienne, DO 04/04/21 2257

## 2021-04-04 NOTE — ED Notes (Signed)
Pt unable to urinate.

## 2021-04-04 NOTE — ED Notes (Signed)
Pt complains of no urination x3 days. Bladder scan performed. Pt bladder scan shows a volume of >335m. After performing bladder scan pt states that she feels like she may have to "pee." Pt ambulated to restroom nearby and informed that a specimen needs to be collected.

## 2021-04-04 NOTE — H&P (Signed)
History of going back thank you so much for at least you someone like that helps thank you yes History and Physical    Briana Sweeney [id number] DOB: 25-Apr-1971 DOA: 04/03/2021  PCP: [name], FNP (Inactive)   Patient coming from: Home  I have personally briefly reviewed patient's old medical records in [place]  Chief Complaint: Left flank pain  HPI: Briana Sweeney is a 50 y.o. female with medical history significant for alcoholic liver cirrhosis with portal hypertension, thrombocytopenia, alcohol abuse, bipolar disorder, history of hepatitis C who presents to the ER for evaluation of left flank pain for several days.  Patient states that she has had multiple falls over the last couple of days.  She also admits to continued alcohol use and her last drink was about 4 days prior to her admission.  She admits to having symptoms of alcohol withdrawal when she does not drink. She also states that she has had difficulty voiding over the last couple of days but denies having any rectal incontinence.  She complains of tingling sensation in her legs but denies having any weakness.  She denies having any saddle anesthesia. She denies having any fever, no chills, no abdominal pain, no hematemesis, no hematochezia, no melena stools, no gum bleeding, no cough, no focal deficits, no blurred vision. Labs show white count 4.3, hemoglobin 13.8, hematocrit 42.5, MCV 92.8, RDW 17.4, platelet count 24 compared to 92 weeks ago, PT 16.3, INR 1.3 Respiratory viral panel is negative Urine analysis is sterile CT scan of abdomen shows cirrhosis and stigmata of portal venous hypertension including paraesophageal varices, splenomegaly, abdominal varices.  No ascites No superimposed acute or inflammatory process identified in the noncontrast abdomen or pelvis.  Chronic calcific pancreatitis. Lumbar spine CT shows no acute fracture or traumatic listhesis.  Mild discogenic and facet degenerative  changes. Few borderline distended loops of small bowel in the mid abdomen without frank high-grade obstructive pattern.  ED Course: Patient is a 50 year old female with a history of alcoholic liver cirrhosis with complications of portal hypertension who presents to the ER for evaluation of pain mostly in the left flank/lumbar area. She admits to continued alcohol use/abuse and has had frequent falls. Platelet count today is 24,000 and hemoglobin is stable at 13.8. Platelet count 2 weeks ago was 90,000 Labs reveal elevated lipase level as well as marked transaminitis and elevated total bilirubin level. Gastroenterology was consulted and patient will be admitted to the hospital for further evaluation.    Review of Systems: As per HPI otherwise all other systems reviewed and negative.    Past Medical History:  Diagnosis Date   Alcohol use    Anxiety    Bipolar affective disorder ([place])    With anxiety features   Bunionette of right foot 11/2019   Cirrhosis of liver (HCC)    Due to alcohol and hepatitis C   Depression    Esophageal 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 (HCC)    ETOHism (HCC)    GERD (gastroesophageal reflux disease)    Hematemesis 02/10/2018   Hepatitis C 2018   hepatitis c and alcohol related hepatitis   Heroin abuse ([place])    History of blood transfusion    "blood doesn't clot; I fell down and had to have a transfusion"   History of kidney stones    History of thrombocytopenia    Menopause 2016   Migraine    "when I get really stressed" (09/01/2017)   S/P foot surgery, right 11/2019  Schizophrenia ([place])    Seizures ([place])    "when I run out of my RX; lots recently" (09/01/2017)   Thrombocytopenia ([place])    Urinary urgency 05/2020    Past Surgical History:  Procedure Laterality Date   BUNIONECTOMY     ESOPHAGOGASTRODUODENOSCOPY N/A 09/03/2017   Procedure: ESOPHAGOGASTRODUODENOSCOPY (EGD);  Surgeon: Doran Stabler, MD;  Location: [facility];  Service:  Gastroenterology;  Laterality: N/A;   FINGER FRACTURE SURGERY Left    "shattered my pinky"   FRACTURE SURGERY     I & D EXTREMITY Left 09/18/2018   Procedure: IRRIGATION AND DEBRIDEMENT EXTREMITY;  Surgeon: Leanora Cover, MD;  Location: [facility];  Service: Orthopedics;  Laterality: Left;   IR PARACENTESIS  07/23/2017   IR PARACENTESIS  07/2017   "did it twice in the same week" (09/01/2017)   SHOULDER OPEN ROTATOR CUFF REPAIR Right    TOOTH EXTRACTION  08/2019   TUBAL LIGATION     VAGINAL HYSTERECTOMY       reports that she has never smoked. She has never used smokeless tobacco. She reports previous alcohol use of about 6.0 standard drinks of alcohol per week. She reports previous drug use.  Allergies  Allergen Reactions   Other Other (See Comments)    Platelets: Rx chest pain, tremors, body aches    Family History  Problem Relation Age of Onset   Lung cancer Mother 67   Alcohol abuse Mother    Throat cancer Father 14      Prior to Admission medications   Medication Sig Start Date End Date Taking? Authorizing Provider  ARIPiprazole (ABILIFY) 10 MG tablet TAKE 1 TABLET BY MOUTH DAILY 01/30/21 01/30/22  Cameron Sprang, MD  ARIPiprazole (ABILIFY) 10 MG tablet Take 1 tablet by mouth daily 02/15/21     clonazePAM (KLONOPIN) 2 MG tablet TAKE 1 TABLET BY MOUTH 2 TIMES DAILY AS NEEDED FOR ANXIETY 11/08/20 05/07/21  Pucilowski, Marchia Bond, MD  diclofenac Sodium (VOLTAREN) 1 % GEL Apply 4 g topically 4 (four) times daily. 05/26/20   [name], FNP  famotidine (PEPCID) 20 MG tablet TAKE 1 TABLET BY MOUTH DAILY 10/20/20 10/20/21  [name], FNP  famotidine (PEPCID) 20 MG tablet Take 1 tablet by mouth daily 02/15/21     FLUoxetine (PROZAC) 40 MG capsule TAKE 1 CAPSULE BY MOUTH DAILY 01/30/21 01/30/22  Cameron Sprang, MD  FLUoxetine (PROZAC) 40 MG capsule Take 1 capsule (40 mg total) by mouth daily. 02/15/21     furosemide (LASIX) 40 MG tablet TAKE 1 TABLET BY MOUTH DAILY. 10/20/20 10/20/21   [name], FNP  gabapentin (NEURONTIN) 300 MG capsule TAKE 2 CAPSULES BY MOUTH 3 TIMES DAILY. NEED OFFICE VISIT FOR MORE REFILLS. 02/06/21 02/06/22  Tresa Garter, MD  lactulose (CHRONULAC) 10 GM/15ML solution Take 30 mLs (20 g total) by mouth 3 (three) times daily. Goal to have 2-3 BMs daily 05/10/20   Volanda Napoleon, MD  levETIRAcetam (KEPPRA) 750 MG tablet Take 1 tablet (750 mg total) by mouth 2 (two) times daily. 01/30/21   Cameron Sprang, MD  mirtazapine (REMERON) 30 MG tablet TAKE 1 TABLET BY MOUTH AT BEDTIME 11/08/20 11/08/21  Pucilowski, Marchia Bond, MD  Multiple Vitamin (MULTIVITAMIN WITH MINERALS) TABS tablet Take 1 tablet by mouth daily. 04/13/19   Hosie Poisson, MD    Physical Exam: Vitals:   04/04/21 0818 04/04/21 0819 04/04/21 0900 04/04/21 1241  BP: 120/87  107/76 117/74  Pulse: 93 93 94  96  Resp: '18  16 16  '$ Temp:      TempSrc:      SpO2: 96% 96% 96% 100%  Weight:      Height:         Vitals:   04/04/21 0818 04/04/21 0819 04/04/21 0900 04/04/21 1241  BP: 120/87  107/76 117/74  Pulse: 93 93 94 96  Resp: '18  16 16  '$ Temp:      TempSrc:      SpO2: 96% 96% 96% 100%  Weight:      Height:          Constitutional: Alert and oriented x 3 . Not in any apparent distress.  Looks older than stated age 43:      Head: Normocephalic and atraumatic.         Eyes: PERLA, EOMI, Conjunctivae are normal. Sclera is non-icteric.       Mouth/Throat: Mucous membranes are dry       Neck: Supple with no signs of meningismus. Cardiovascular: Regular rate and rhythm. No murmurs, gallops, or rubs. 2+ symmetrical distal pulses are present . No JVD. No LE edema Respiratory: Respiratory effort normal .Lungs sounds clear bilaterally. No wheezes, crackles, or rhonchi.  Gastrointestinal: Soft, non tender, and non distended with positive bowel sounds.  Genitourinary: No CVA tenderness. Musculoskeletal: Nontender with normal range of motion in all extremities. No cyanosis, or erythema  of extremities. Neurologic:  Face is symmetric. Moving all extremities. No gross focal neurologic deficits . Skin: Skin is warm, dry.  No rash or ulcers Psychiatric: Mood and affect are normal    Labs on Admission: I have personally reviewed following labs and imaging studies  CBC: Recent Labs  Lab 04/02/21 0000 04/04/21 0245  WBC 7.2 4.3  NEUTROABS 4.90 2.6  HGB 14.5 13.8  HCT 44 42.5  MCV  --  92.8  PLT 33* 24*   Basic Metabolic Panel: Recent Labs  Lab 04/04/21 0205  NA 140  K 4.4  CL 101  CO2 24  GLUCOSE 97  BUN 24*  CREATININE 1.28*  CALCIUM 8.9   GFR: Estimated Creatinine Clearance: 44.7 mL/min (A) (by C-G formula based on SCr of 1.28 mg/dL (H)). Liver Function Tests: Recent Labs  Lab 04/04/21 0205  AST 415*  ALT 127*  ALKPHOS 175*  BILITOT 4.3*  PROT 7.7  ALBUMIN 3.4*   Recent Labs  Lab 04/04/21 0205  LIPASE 724*   No results for input(s): AMMONIA in the last 168 hours. Coagulation Profile: Recent Labs  Lab 04/04/21 0245  INR 1.3*   Cardiac Enzymes: No results for input(s): CKTOTAL, CKMB, CKMBINDEX, TROPONINI in the last 168 hours. BNP (last 3 results) No results for input(s): PROBNP in the last 8760 hours. HbA1C: No results for input(s): HGBA1C in the last 72 hours. CBG: No results for input(s): GLUCAP in the last 168 hours. Lipid Profile: No results for input(s): CHOL, HDL, LDLCALC, TRIG, CHOLHDL, LDLDIRECT in the last 72 hours. Thyroid Function Tests: No results for input(s): TSH, T4TOTAL, FREET4, T3FREE, THYROIDAB in the last 72 hours. Anemia Panel: No results for input(s): VITAMINB12, FOLATE, FERRITIN, TIBC, IRON, RETICCTPCT in the last 72 hours. Urine analysis:    Component Value Date/Time   COLORURINE AMBER (A) 04/04/2021 0926   APPEARANCEUR HAZY (A) 04/04/2021 0926   LABSPEC 1.017 04/04/2021 0926   PHURINE 5.0 04/04/2021 0926   GLUCOSEU NEGATIVE 04/04/2021 0926   HGBUR SMALL (A) 04/04/2021 0926   BILIRUBINUR NEGATIVE  04/04/2021 0926   BILIRUBINUR  NEG 05/26/2020 1544   KETONESUR 5 (A) 04/04/2021 0926   PROTEINUR NEGATIVE 04/04/2021 0926   UROBILINOGEN 1.0 05/26/2020 1544   UROBILINOGEN 0.2 12/02/2017 0836   NITRITE NEGATIVE 04/04/2021 0926   LEUKOCYTESUR MODERATE (A) 04/04/2021 0926    Radiological Exams on Admission: CT ABDOMEN PELVIS WO CONTRAST  Result Date: 04/04/2021 CLINICAL DATA:  50 year old female with low back and flank pain. Thrombocytopenia. Hepatitis C, cirrhosis. EXAM: CT ABDOMEN AND PELVIS WITHOUT CONTRAST TECHNIQUE: Multidetector CT imaging of the abdomen and pelvis was performed following the standard protocol without IV contrast. COMPARISON:  CT Abdomen and Pelvis 12/23/2020 and earlier. FINDINGS: Lower chest: No cardiomegaly or pericardial effusion. Increased posterior mediastinal soft tissue appears associated with the descending thoracic esophagus, and discrete esophageal varices were noted in that region on the April CT with contrast. Lower lung volumes with increased lung base atelectasis compared to April. No pleural effusion. Hepatobiliary: Pronounced hepatic steatosis. Nodular liver contour. Small caliber perihepatic varices. No discrete liver lesion in the absence of contrast. 2.2 cm gallstone. Gallbladder appears stable with mild wall thickening. Pancreas: Dystrophic calcifications at the pancreatic head and uncinate are stable. Spleen: Mild splenomegaly. Estimated splenic volume 421 mL (normal splenic volume range 83 - 412 mL). Perisplenic varices. Occasional small dystrophic calcifications in the spleen are stable. Adrenals/Urinary Tract: Normal adrenal glands. Small volume of contrast in nondilated renal collecting systems. Otherwise negative noncontrast kidneys. No hydroureter. Unremarkable bladder. Chronic pelvic phleboliths. Stomach/Bowel: No dilated large or small bowel. Redundant large bowel with mild to moderate retained stool throughout. Occasional large bowel diverticula. Normal  appendix on coronal image 52. Negative terminal ileum. No small bowel inflammation is evident. Gastrohepatic ligament and gastrosplenic ligament varices. Otherwise negative noncontrast stomach and duodenum. No free air. No free fluid. Vascular/Lymphatic: Normal caliber abdominal aorta. Mild Calcified aortic atherosclerosis. Gastrohepatic ligament and gastrosplenic ligament varices. Vascular patency is not evaluated in the absence of IV contrast. No lymphadenopathy. Reproductive: Negative. Other: No pelvic free fluid. Musculoskeletal: No acute osseous abnormality identified. IMPRESSION: 1. Cirrhosis and stigmata of portal venous hypertension including paraesophageal varices, splenomegaly, abdominal varices. No ascites. 2. No superimposed acute or inflammatory process identified in the non-contrast abdomen or pelvis. 3. Chronic cholelithiasis.  Chronic calcific pancreatitis. 4. Lower lung volumes with atelectasis. Electronically Signed   By: Genevie Ann M.D.   On: 04/04/2021 06:18   DG Lumbar Spine Complete  Result Date: 04/04/2021 CLINICAL DATA:  Fall, back pain EXAM: LUMBAR SPINE - COMPLETE 4+ VIEW COMPARISON:  Radiograph 10/14/2017, CT 09/27/2020 FINDINGS: Five lumbar type vertebrae. No visible acute fracture or vertebral body height loss. No static or traumatic listhesis or discernible spondylolysis. Minimal discogenic and facet degenerative changes, maximal towards the lower lumbar levels. Included bones of the pelvis appear intact and congruent with contiguity of the sacral arcs. Few non contiguous borderline distended air-filled loops of small bowel in the mid abdomen, without frank high-grade obstructive pattern. Correlate for abdominal symptoms. Remaining soft tissues are free of acute abnormality. IMPRESSION: No acute fracture or traumatic listhesis. Mild discogenic and facet degenerative changes. Few borderline air distended loops of small bowel in the mid abdomen are noncontiguous without frank high-grade  obstructive pattern. Could correlate for abdominal symptoms. Electronically Signed   By: Lovena Le M.D.   On: 04/04/2021 02:44   CT Head Wo Contrast  Result Date: 04/04/2021 CLINICAL DATA:  50 year old female with dizziness and multiple falls over the past few days. EXAM: CT HEAD WITHOUT CONTRAST TECHNIQUE: Contiguous axial images were obtained from  the base of the skull through the vertex without intravenous contrast. COMPARISON:  Brain MRI 04/07/2019.  Head CT 03/29/2021. FINDINGS: Brain: Stable non contrast CT appearance of the brain. No midline shift, ventriculomegaly, mass effect, evidence of mass lesion, intracranial hemorrhage or evidence of cortically based acute infarction. Preserved gray-white matter differentiation. Small dystrophic calcification in the left cerebellar hemisphere on series 2, image 7 is unchanged since 2019. Vascular: No suspicious intracranial vascular hyperdensity. Skull: Stable and intact. Sinuses/Orbits: Visualized paranasal sinuses and mastoids are stable and well aerated. Tympanic cavities are clear. Other: Visualized orbits and scalp soft tissues are within normal limits. IMPRESSION: No acute intracranial abnormality. Stable age advanced cerebral volume loss and small dystrophic calcification of the left cerebellum. Electronically Signed   By: Genevie Ann M.D.   On: 04/04/2021 06:11     Assessment/Plan Principal Problem:   Left flank pain Active Problems:   Alcohol abuse   Alcoholic cirrhosis of liver with ascites (HCC)   Thrombocytopenia (HCC)   Portal hypertension ([place])   Fall   History of alcohol abuse   AKI (acute kidney injury) ([place])   Acute on chronic pancreatitis (HCC)   Transaminitis      Left Flank Pain Most likely secondary to acute on chronic alcoholic pancreatitis Patient continues to drink and has elevated lipase levels Place patient on a clear liquid diet since she has no symptoms of nausea vomiting Pain control Supportive care with  antiemetics     History of alcohol abuse Patient is at high risk of developing symptoms of alcohol withdrawal Will place patient on CIWA protocol and administer lorazepam for CIWA score of 8 or greater     Alcoholic liver cirrhosis with portal hypertension Patient has alcoholic liver cirrhosis with portal hypertension, splenomegaly and severe thrombocytopenia without evidence of bleeding at this time. Worsening thrombocytopenia appears to be multifactorial and secondary to splenomegaly from liver cirrhosis as well as bone marrow suppression from alcohol abuse     Acute kidney injury At baseline patient has a serum creatinine of 0.6 and today on admission it is 1.2. Worsening renal function appears to be secondary to poor oral intake and concomitant diuretic use. Hold Lasix Will hydrate patient and repeat renal parameters in am      Transaminitis Patient noted to have marked elevation in her liver enzymes as well as total bilirubin levels This could be secondary to continued alcohol abuse versus medication induced Will also obtain total CK levels Avoid hepatotoxic agents     Schizoaffective disorder Continue mirtazapine, fluoxetine and Abilify.    DVT prophylaxis: SCD  Code Status: full code  Family Communication: Greater than 50% of time was spent discussing patient's condition and plan of care with her at the bedside.  All questions and concerns have been addressed.  She verbalizes understanding and agrees with the plan. Disposition Plan: Back to previous home environment Consults called: Gastroenterology/hematology Status: At the time of admission, it appears that the appropriate admission status for this patient is inpatient. This is judged to be reasonable and necessary in order to provide the required intensity of service to ensure the patient's safety given the presenting symptoms, physical exam findings, and initial radiographic and laboratory data in the  context of their comorbid conditions. Patient requires inpatient status due to high intensity of service, high risk for further deterioration and high frequency of surveillance required.    Collier Bullock MD [facility]     04/04/2021, 12:53 PM

## 2021-04-04 NOTE — ED Notes (Signed)
Patient transported to CT 

## 2021-04-04 NOTE — ED Triage Notes (Signed)
Pt states that she has been having flank pain for a few days. She reports being a cancer patient and actively receiving treatment.

## 2021-04-04 NOTE — Consult Note (Signed)
Referring Provider: ED Primary Care Physician:  [name], FNP (Inactive) Primary Gastroenterologist:  Althia Forts  Reason for Consultation:  Decompensated cirrhosis, elevated lipase  HPI: Briana Sweeney is a 50 y.o. female with past medical history of alcohol related cirrhosis, thrombocytopenia, hepatitis C, esophageal varices presenting for consultation of pancreatitis and decompensated cirrhosis.  Patient states she started having back/flank pain over the past 3 days and thus presented to the ED.  Upon arrival, she was found to have elevated lipase (724), as well as abnormal LFTs.  Denies abdoninal pain currently. Denies nausea/vomiting, dysphagia, melena, hematochezia.  She takes lactulose but has not had a BM for a couple of days.  Reports confusion and falls at home.  Currently, drinks half a pint of bee daily, which she states is decreased from prior intake.  CT today 7/27 revealed cirrhosis and stigmata of portal venous hypertension including paraesophageal varices, splenomegaly, abdominal varices. No ascites. Chronic calcific pancreatitis.  EGD in 08/2017: Grade II esophageal varices. Portal hypertensive gastropathy.  Past Medical History:  Diagnosis Date   Alcohol use    Anxiety    Bipolar affective disorder ([place])    With anxiety features   Bunionette of right foot 11/2019   Cirrhosis of liver (HCC)    Due to alcohol and hepatitis C   Depression    Esophageal 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 (HCC)    ETOHism (HCC)    GERD (gastroesophageal reflux disease)    Hematemesis 02/10/2018   Hepatitis C 2018   hepatitis c and alcohol related hepatitis   Heroin abuse ([place])    History of blood transfusion    "blood doesn't clot; I fell down and had to have a transfusion"   History of kidney stones    History of thrombocytopenia    Menopause 2016   Migraine    "when I get really stressed" (09/01/2017)   S/P foot surgery, right 11/2019   Schizophrenia ([place])    Seizures ([place])     "when I run out of my RX; lots recently" (09/01/2017)   Thrombocytopenia ([place])    Urinary urgency 05/2020    Past Surgical History:  Procedure Laterality Date   BUNIONECTOMY     ESOPHAGOGASTRODUODENOSCOPY N/A 09/03/2017   Procedure: ESOPHAGOGASTRODUODENOSCOPY (EGD);  Surgeon: Doran Stabler, MD;  Location: [facility];  Service: Gastroenterology;  Laterality: N/A;   FINGER FRACTURE SURGERY Left    "shattered my pinky"   FRACTURE SURGERY     I & D EXTREMITY Left 09/18/2018   Procedure: IRRIGATION AND DEBRIDEMENT EXTREMITY;  Surgeon: Leanora Cover, MD;  Location: [facility];  Service: Orthopedics;  Laterality: Left;   IR PARACENTESIS  07/23/2017   IR PARACENTESIS  07/2017   "did it twice in the same week" (09/01/2017)   SHOULDER OPEN ROTATOR CUFF REPAIR Right    TOOTH EXTRACTION  08/2019   TUBAL LIGATION     VAGINAL HYSTERECTOMY      Prior to Admission medications   Medication Sig Start Date End Date Taking? Authorizing Provider  ARIPiprazole (ABILIFY) 10 MG tablet TAKE 1 TABLET BY MOUTH DAILY 01/30/21 01/30/22  Cameron Sprang, MD  ARIPiprazole (ABILIFY) 10 MG tablet Take 1 tablet by mouth daily 02/15/21     clonazePAM (KLONOPIN) 2 MG tablet TAKE 1 TABLET BY MOUTH 2 TIMES DAILY AS NEEDED FOR ANXIETY 11/08/20 05/07/21  Pucilowski, Marchia Bond, MD  diclofenac Sodium (VOLTAREN) 1 % GEL Apply 4 g topically 4 (four) times daily. 05/26/20   [name], FNP  famotidine (PEPCID) 20 MG tablet TAKE 1 TABLET BY MOUTH DAILY 10/20/20 10/20/21  [name], FNP  famotidine (PEPCID) 20 MG tablet Take 1 tablet by mouth daily 02/15/21     FLUoxetine (PROZAC) 40 MG capsule TAKE 1 CAPSULE BY MOUTH DAILY 01/30/21 01/30/22  Cameron Sprang, MD  FLUoxetine (PROZAC) 40 MG capsule Take 1 capsule (40 mg total) by mouth daily. 02/15/21     furosemide (LASIX) 40 MG tablet TAKE 1 TABLET BY MOUTH DAILY. 10/20/20 10/20/21  [name], FNP  gabapentin (NEURONTIN) 300 MG capsule TAKE 2 CAPSULES BY MOUTH 3 TIMES  DAILY. NEED OFFICE VISIT FOR MORE REFILLS. 02/06/21 02/06/22  Tresa Garter, MD  lactulose (CHRONULAC) 10 GM/15ML solution Take 30 mLs (20 g total) by mouth 3 (three) times daily. Goal to have 2-3 BMs daily 05/10/20   Volanda Napoleon, MD  levETIRAcetam (KEPPRA) 750 MG tablet Take 1 tablet (750 mg total) by mouth 2 (two) times daily. 01/30/21   Cameron Sprang, MD  mirtazapine (REMERON) 30 MG tablet TAKE 1 TABLET BY MOUTH AT BEDTIME 11/08/20 11/08/21  Pucilowski, Marchia Bond, MD  Multiple Vitamin (MULTIVITAMIN WITH MINERALS) TABS tablet Take 1 tablet by mouth daily. 04/13/19   Hosie Poisson, MD    Scheduled Meds: Continuous Infusions:  lactated ringers     PRN Meds:.  Allergies as of 04/03/2021 - Review Complete 03/21/2021  Allergen Reaction Noted   Other Other (See Comments) 12/15/2019    Family History  Problem Relation Age of Onset   Lung cancer Mother 47   Alcohol abuse Mother    Throat cancer Father 85    Social History   Socioeconomic History   Marital status: Divorced    Spouse name: Not on file   Number of children: 2   Years of education: Not on file   Highest education level: Not on file  Occupational History   Occupation: applying for disability  Tobacco Use   Smoking status: Never   Smokeless tobacco: Never  Vaping Use   Vaping Use: Never used  Substance and Sexual Activity   Alcohol use: Not Currently    Alcohol/week: 6.0 standard drinks    Types: 6 Cans of beer per week    Comment: weekly   Drug use: Not Currently   Sexual activity: Not on file  Other Topics Concern   Not on file  Social History Narrative   She moved with a boyfriend to [place] and was followed at [facility].  He died of a massive heart attack in 8/18, per her report, and so she moved back to [place] and is living with a friend.      Right handed   2 story home    Lives alone 11/09/19   Social Determinants of Health   Financial Resource Strain: Not on file  Food  Insecurity: Not on file  Transportation Needs: Not on file  Physical Activity: Not on file  Stress: Not on file  Social Connections: Not on file  Intimate Partner Violence: Not on file    Review of Systems: Review of Systems  Constitutional:  Positive for malaise/fatigue. Negative for chills and fever.  HENT:  Negative for hearing loss and tinnitus.   Eyes:  Negative for pain and redness.  Respiratory:  Negative for cough and shortness of breath.   Cardiovascular:  Negative for chest pain and palpitations.  Gastrointestinal:  Negative for abdominal pain, blood in stool, constipation, diarrhea, heartburn, melena, nausea and vomiting.  Genitourinary:  Positive for flank pain.  Musculoskeletal:  Positive for back pain and falls.  Skin:  Negative for itching and rash.  Neurological:  Negative for tremors and loss of consciousness.  Psychiatric/Behavioral:  Positive for substance abuse. The patient is not nervous/anxious.     Physical Exam: Vital signs: Vitals:   04/04/21 0819 04/04/21 0900  BP:  107/76  Pulse: 93 94  Resp:  16  Temp:    SpO2: 96% 96%      Physical Exam Vitals reviewed.  Constitutional:      General: She is not in acute distress. HENT:     Head: Normocephalic and atraumatic.     Nose: Nose normal. No congestion.     Mouth/Throat:     Mouth: Mucous membranes are moist.     Pharynx: Oropharynx is clear.  Eyes:     General: Scleral icterus present.     Extraocular Movements: Extraocular movements intact.     Conjunctiva/sclera: Conjunctivae normal.  Cardiovascular:     Rate and Rhythm: Normal rate and regular rhythm.  Pulmonary:     Effort: Pulmonary effort is normal. No respiratory distress.  Abdominal:     General: Bowel sounds are normal. There is no distension.     Palpations: Abdomen is soft. There is no mass.     Tenderness: There is no abdominal tenderness. There is no guarding or rebound.     Hernia: No hernia is present.  Musculoskeletal:         General: No swelling or tenderness.     Cervical back: Normal range of motion and neck supple.  Skin:    General: Skin is warm and dry.  Neurological:     General: No focal deficit present.     Mental Status: She is oriented to person, place, and time. She is lethargic.  Psychiatric:        Mood and Affect: Mood normal.        Behavior: Behavior normal. Behavior is cooperative.   GI:  Lab Results: Recent Labs    04/02/21 0000 04/04/21 0245  WBC 7.2 4.3  HGB 14.5 13.8  HCT 44 42.5  PLT 33* 24*   BMET Recent Labs    04/04/21 0205  NA 140  K 4.4  CL 101  CO2 24  GLUCOSE 97  BUN 24*  CREATININE 1.28*  CALCIUM 8.9   LFT Recent Labs    04/04/21 0205  PROT 7.7  ALBUMIN 3.4*  AST 415*  ALT 127*  ALKPHOS 175*  BILITOT 4.3*   PT/INR Recent Labs    04/04/21 0245  LABPROT 16.3*  INR 1.3*     Studies/Results: CT ABDOMEN PELVIS WO CONTRAST  Result Date: 04/04/2021 CLINICAL DATA:  50 year old female with low back and flank pain. Thrombocytopenia. Hepatitis C, cirrhosis. EXAM: CT ABDOMEN AND PELVIS WITHOUT CONTRAST TECHNIQUE: Multidetector CT imaging of the abdomen and pelvis was performed following the standard protocol without IV contrast. COMPARISON:  CT Abdomen and Pelvis 12/23/2020 and earlier. FINDINGS: Lower chest: No cardiomegaly or pericardial effusion. Increased posterior mediastinal soft tissue appears associated with the descending thoracic esophagus, and discrete esophageal varices were noted in that region on the April CT with contrast. Lower lung volumes with increased lung base atelectasis compared to April. No pleural effusion. Hepatobiliary: Pronounced hepatic steatosis. Nodular liver contour. Small caliber perihepatic varices. No discrete liver lesion in the absence of contrast. 2.2 cm gallstone. Gallbladder appears stable with mild wall thickening. Pancreas: Dystrophic calcifications at the pancreatic  head and uncinate are stable. Spleen: Mild  splenomegaly. Estimated splenic volume 421 mL (normal splenic volume range 83 - 412 mL). Perisplenic varices. Occasional small dystrophic calcifications in the spleen are stable. Adrenals/Urinary Tract: Normal adrenal glands. Small volume of contrast in nondilated renal collecting systems. Otherwise negative noncontrast kidneys. No hydroureter. Unremarkable bladder. Chronic pelvic phleboliths. Stomach/Bowel: No dilated large or small bowel. Redundant large bowel with mild to moderate retained stool throughout. Occasional large bowel diverticula. Normal appendix on coronal image 52. Negative terminal ileum. No small bowel inflammation is evident. Gastrohepatic ligament and gastrosplenic ligament varices. Otherwise negative noncontrast stomach and duodenum. No free air. No free fluid. Vascular/Lymphatic: Normal caliber abdominal aorta. Mild Calcified aortic atherosclerosis. Gastrohepatic ligament and gastrosplenic ligament varices. Vascular patency is not evaluated in the absence of IV contrast. No lymphadenopathy. Reproductive: Negative. Other: No pelvic free fluid. Musculoskeletal: No acute osseous abnormality identified. IMPRESSION: 1. Cirrhosis and stigmata of portal venous hypertension including paraesophageal varices, splenomegaly, abdominal varices. No ascites. 2. No superimposed acute or inflammatory process identified in the non-contrast abdomen or pelvis. 3. Chronic cholelithiasis.  Chronic calcific pancreatitis. 4. Lower lung volumes with atelectasis. Electronically Signed   By: Genevie Ann M.D.   On: 04/04/2021 06:18   DG Lumbar Spine Complete  Result Date: 04/04/2021 CLINICAL DATA:  Fall, back pain EXAM: LUMBAR SPINE - COMPLETE 4+ VIEW COMPARISON:  Radiograph 10/14/2017, CT 09/27/2020 FINDINGS: Five lumbar type vertebrae. No visible acute fracture or vertebral body height loss. No static or traumatic listhesis or discernible spondylolysis. Minimal discogenic and facet degenerative changes, maximal towards  the lower lumbar levels. Included bones of the pelvis appear intact and congruent with contiguity of the sacral arcs. Few non contiguous borderline distended air-filled loops of small bowel in the mid abdomen, without frank high-grade obstructive pattern. Correlate for abdominal symptoms. Remaining soft tissues are free of acute abnormality. IMPRESSION: No acute fracture or traumatic listhesis. Mild discogenic and facet degenerative changes. Few borderline air distended loops of small bowel in the mid abdomen are noncontiguous without frank high-grade obstructive pattern. Could correlate for abdominal symptoms. Electronically Signed   By: Lovena Le M.D.   On: 04/04/2021 02:44   CT Head Wo Contrast  Result Date: 04/04/2021 CLINICAL DATA:  50 year old female with dizziness and multiple falls over the past few days. EXAM: CT HEAD WITHOUT CONTRAST TECHNIQUE: Contiguous axial images were obtained from the base of the skull through the vertex without intravenous contrast. COMPARISON:  Brain MRI 04/07/2019.  Head CT 03/29/2021. FINDINGS: Brain: Stable non contrast CT appearance of the brain. No midline shift, ventriculomegaly, mass effect, evidence of mass lesion, intracranial hemorrhage or evidence of cortically based acute infarction. Preserved gray-white matter differentiation. Small dystrophic calcification in the left cerebellar hemisphere on series 2, image 7 is unchanged since 2019. Vascular: No suspicious intracranial vascular hyperdensity. Skull: Stable and intact. Sinuses/Orbits: Visualized paranasal sinuses and mastoids are stable and well aerated. Tympanic cavities are clear. Other: Visualized orbits and scalp soft tissues are within normal limits. IMPRESSION: No acute intracranial abnormality. Stable age advanced cerebral volume loss and small dystrophic calcification of the left cerebellum. Electronically Signed   By: Genevie Ann M.D.   On: 04/04/2021 06:11    Impression: Decompensated cirrhosis,  hepatic encephalopathy; MELD score of 17 as of 04/04/21 (6% 90-day mortality) -T. Bili 4.3/ AST 415/ ALT 127/ ALP 175 -INR 1.3 -BUN 24/ Cr 1.28 -Ongoing alcohol use  Elevated lipase, back pain. Possible pancreatitis, though no abdominal pain and no acute pancreatitis  seen on CT.   -Lipase 724 today  Thrombocytopenia, due to cirrhosis, followed by hem/onc -Platelets 24K/uL  Plan: Lactulose TID.  LR at 100 cc/hour for possible pancreatitis.  Clear liquid diet.  Repeat lipase tomorrow.  Continue supportive care.  Importance of alcohol abstinence discussed.  [facility] will follow.    LOS: 0 days   Salley Slaughter  PA-C 04/04/2021, 10:41 AM  Contact #  [phone]

## 2021-04-04 NOTE — ED Provider Notes (Signed)
Emergency Medicine Provider Triage Evaluation Note  Briana Sweeney , a 50 y.o. female  was evaluated in triage.  Pt complains of left low back pain with associated left leg weakness and pain.  Pt reports a hx of liver cirrhosis and low platelets but has never had back pain.  Pt reports she missed her thrombocytopenia shot yesterday.  She reports multiple falls over the last few days. Also with decreased appetite.  States she feels dizzy after falling and hitting her head.  Pt reports no urination in the last 2 days.  Pt has been taking ibuprofen, mydol,   Review of Systems  Positive: Back pain, decreased urination, falls, dizzy Negative: Syncope, chest pain  Physical Exam  BP 104/75 (BP Location: Right Arm)   Pulse (!) 101   Temp 97.9 F (36.6 C) (Oral)   Resp 16   Ht 5' (1.524 m)   Wt 63.5 kg   SpO2 100%   BMI 27.34 kg/m  Gen:   Awake, no distress, uncomfortable Resp:  Normal effort  MSK:   Moves extremities without difficulty  Other:  Abd distended, TTP of the left lower paraspinal muscles, no CVA tenderness.  Medical Decision Making  Medically screening exam initiated at 12:43 AM.  Appropriate orders placed.  Briana Sweeney was informed that the remainder of the evaluation will be completed by another provider, this initial triage assessment does not replace that evaluation, and the importance of remaining in the ED until their evaluation is complete.  Labs and imaging pending. Concern for retroperitoneal bleed.     Netra Postlethwait, Gwenlyn Perking 04/04/21 [place], [address], DO 04/04/21 [phone]

## 2021-04-04 NOTE — ED Notes (Signed)
Pt is back from CT

## 2021-04-04 NOTE — ED Notes (Signed)
Pt IV access was out during CT with contrast. Swelling noted. Ice packed applied.  IV cannula removed.

## 2021-04-05 ENCOUNTER — Inpatient Hospital Stay (HOSPITAL_COMMUNITY): Payer: Medicaid Other

## 2021-04-05 DIAGNOSIS — K859 Acute pancreatitis without necrosis or infection, unspecified: Secondary | ICD-10-CM

## 2021-04-05 DIAGNOSIS — F1011 Alcohol abuse, in remission: Secondary | ICD-10-CM

## 2021-04-05 DIAGNOSIS — R109 Unspecified abdominal pain: Secondary | ICD-10-CM | POA: Diagnosis not present

## 2021-04-05 DIAGNOSIS — N179 Acute kidney failure, unspecified: Secondary | ICD-10-CM | POA: Diagnosis not present

## 2021-04-05 DIAGNOSIS — K7031 Alcoholic cirrhosis of liver with ascites: Secondary | ICD-10-CM

## 2021-04-05 DIAGNOSIS — M5441 Lumbago with sciatica, right side: Secondary | ICD-10-CM

## 2021-04-05 DIAGNOSIS — M5442 Lumbago with sciatica, left side: Secondary | ICD-10-CM

## 2021-04-05 DIAGNOSIS — D696 Thrombocytopenia, unspecified: Secondary | ICD-10-CM

## 2021-04-05 DIAGNOSIS — K861 Other chronic pancreatitis: Secondary | ICD-10-CM

## 2021-04-05 DIAGNOSIS — R7401 Elevation of levels of liver transaminase levels: Secondary | ICD-10-CM

## 2021-04-05 DIAGNOSIS — K766 Portal hypertension: Secondary | ICD-10-CM

## 2021-04-05 DIAGNOSIS — F1010 Alcohol abuse, uncomplicated: Secondary | ICD-10-CM

## 2021-04-05 DIAGNOSIS — W19XXXA Unspecified fall, initial encounter: Secondary | ICD-10-CM

## 2021-04-05 LAB — BASIC METABOLIC PANEL
Anion gap: 8 (ref 5–15)
BUN: 13 mg/dL (ref 6–20)
CO2: 27 mmol/L (ref 22–32)
Calcium: 8.2 mg/dL — ABNORMAL LOW (ref 8.9–10.3)
Chloride: 99 mmol/L (ref 98–111)
Creatinine, Ser: 0.62 mg/dL (ref 0.44–1.00)
GFR, Estimated: >60 mL/min (ref >60)
Glucose, Bld: 103 mg/dL — ABNORMAL HIGH (ref 70–99)
Potassium: 3.1 mmol/L — ABNORMAL LOW (ref 3.5–5.1)
Sodium: 134 mmol/L — ABNORMAL LOW (ref 135–145)

## 2021-04-05 LAB — CBC
HCT: 39.5 % (ref 36.0–46.0)
Hemoglobin: 12.8 g/dL (ref 12.0–15.0)
MCH: 30.1 pg (ref 26.0–34.0)
MCHC: 32.4 g/dL (ref 30.0–36.0)
MCV: 92.9 fL (ref 80.0–100.0)
Platelets: 23 10*3/uL — CL (ref 150–400)
RBC: 4.25 MIL/uL (ref 3.87–5.11)
RDW: 17.5 % — ABNORMAL HIGH (ref 11.5–15.5)
WBC: 2.8 10*3/uL — ABNORMAL LOW (ref 4.0–10.5)
nRBC: 0 % (ref 0.0–0.2)

## 2021-04-05 LAB — HEPATIC FUNCTION PANEL
ALT: 117 U/L — ABNORMAL HIGH (ref 0–44)
AST: 328 U/L — ABNORMAL HIGH (ref 15–41)
Albumin: 3.1 g/dL — ABNORMAL LOW (ref 3.5–5.0)
Alkaline Phosphatase: 161 U/L — ABNORMAL HIGH (ref 38–126)
Bilirubin, Direct: 1.2 mg/dL — ABNORMAL HIGH (ref 0.0–0.2)
Indirect Bilirubin: 1.9 mg/dL — ABNORMAL HIGH (ref 0.3–0.9)
Total Bilirubin: 3.1 mg/dL — ABNORMAL HIGH (ref 0.3–1.2)
Total Protein: 7.1 g/dL (ref 6.5–8.1)

## 2021-04-05 LAB — AMMONIA: Ammonia: 69 umol/L — ABNORMAL HIGH (ref 9–35)

## 2021-04-05 LAB — LIPASE, BLOOD: Lipase: 426 U/L — ABNORMAL HIGH (ref 11–51)

## 2021-04-05 LAB — HIV ANTIBODY (ROUTINE TESTING W REFLEX): HIV Screen 4th Generation wRfx: NONREACTIVE

## 2021-04-05 MED ORDER — ROMIPLOSTIM 125 MCG ~~LOC~~ SOLR
2.0000 ug/kg | Freq: Once | SUBCUTANEOUS | Status: AC
  Administered 2021-04-05: 125 ug via SUBCUTANEOUS
  Filled 2021-04-05: qty 0.25

## 2021-04-05 MED ORDER — CLONAZEPAM 1 MG PO TABS
2.0000 mg | ORAL_TABLET | Freq: Two times a day (BID) | ORAL | Status: DC | PRN
  Administered 2021-04-05 – 2021-04-06 (×4): 2 mg via ORAL
  Filled 2021-04-05 (×4): qty 2

## 2021-04-05 MED ORDER — GADOBUTROL 1 MMOL/ML IV SOLN
6.5000 mL | Freq: Once | INTRAVENOUS | Status: AC | PRN
  Administered 2021-04-05: 6.5 mL via INTRAVENOUS

## 2021-04-05 MED ORDER — POTASSIUM CHLORIDE CRYS ER 20 MEQ PO TBCR
40.0000 meq | EXTENDED_RELEASE_TABLET | Freq: Once | ORAL | Status: AC
  Administered 2021-04-05: 40 meq via ORAL
  Filled 2021-04-05: qty 2

## 2021-04-05 MED ORDER — FENTANYL CITRATE (PF) 100 MCG/2ML IJ SOLN
25.0000 ug | INTRAMUSCULAR | Status: DC | PRN
  Administered 2021-04-05 – 2021-04-07 (×7): 25 ug via INTRAVENOUS
  Filled 2021-04-05 (×7): qty 2

## 2021-04-05 MED ORDER — POTASSIUM CHLORIDE CRYS ER 20 MEQ PO TBCR
40.0000 meq | EXTENDED_RELEASE_TABLET | Freq: Two times a day (BID) | ORAL | Status: DC
  Administered 2021-04-05: 40 meq via ORAL
  Filled 2021-04-05: qty 2

## 2021-04-05 MED ORDER — ARIPIPRAZOLE 10 MG PO TABS
10.0000 mg | ORAL_TABLET | Freq: Every day | ORAL | Status: DC
  Administered 2021-04-05 – 2021-04-08 (×4): 10 mg via ORAL
  Filled 2021-04-05 (×4): qty 1

## 2021-04-05 MED ORDER — SODIUM CHLORIDE 0.9 % IV SOLN: INTRAVENOUS | Status: DC

## 2021-04-05 NOTE — TOC Initial Note (Signed)
Transition of Care [facility]) - Initial/Assessment Note    Patient Details  Name: Briana Sweeney MRN: [id number] Date of Birth: March 30, 1971  [facility]) CM/SW Contact:    Dessa Phi, RN Phone Number: 04/05/2021, 10:19 AM  Clinical Narrative: Spoke to patient about d/c plans-home,states pcp has left-she has been informed to f/u with medicaid case worker for new assigned pcp(voiced understanding),[facility] ot[facility] pharmacy,decliens resources for etoh/heroine,tobacco-states I don't do those things anymore,will need bus pass @ d/c.   Continue to follow.               Expected Discharge Plan: Home/Self Care Barriers to Discharge: Continued Medical Work up   Patient Goals and CMS Choice Patient states their goals for this hospitalization and ongoing recovery are:: go home CMS Medicare.gov Compare Post Acute Care list provided to:: Patient Choice offered to / list presented to : Patient  Expected Discharge Plan and Services Expected Discharge Plan: Home/Self Care   Discharge Planning Services: CM Consult   Living arrangements for the past 2 months: Single Family Home                                      Prior Living Arrangements/Services Living arrangements for the past 2 months: Single Family Home Lives with:: Self Patient language and need for interpreter reviewed:: Yes Do you feel safe going back to the place where you live?: Yes      Need for Family Participation in Patient Care: No (Comment) Care giver support system in place?: Yes (comment)   Criminal Activity/Legal Involvement Pertinent to Current Situation/Hospitalization: No - Comment as needed  Activities of Daily Living Home Assistive Devices/Equipment: None ADL Screening (condition at time of admission) Patient's cognitive ability adequate to safely complete daily activities?: Yes Is the patient deaf or have difficulty hearing?: No Does the patient have difficulty seeing, even when wearing  glasses/contacts?: No Does the patient have difficulty concentrating, remembering, or making decisions?: Yes (pt states she is "delusional from my platelets") Patient able to express need for assistance with ADLs?: Yes Does the patient have difficulty dressing or bathing?: No Independently performs ADLs?: No Communication: Independent Dressing (OT): Independent Grooming: Independent Feeding: Independent Bathing: Independent Toileting: Needs assistance Is this a change from baseline?: Pre-admission baseline In/Out Bed: Needs assistance Is this a change from baseline?: Pre-admission baseline Walks in Home: Needs assistance Is this a change from baseline?: Pre-admission baseline Does the patient have difficulty walking or climbing stairs?: Yes Weakness of Legs: Both Weakness of Arms/Hands: Both  Permission Sought/Granted Permission sought to share information with : Case Manager Permission granted to share information with : Yes, Verbal Permission Granted  Share Information with NAME: Case manager           Emotional Assessment Appearance:: Appears stated age Attitude/Demeanor/Rapport: Gracious Affect (typically observed): Accepting Orientation: : Oriented to Self, Oriented to Place, Oriented to  Time, Oriented to Situation Alcohol / Substance Use: Illicit Drugs, Alcohol Use, Tobacco Use Psych Involvement: No (comment)  Admission diagnosis:  Thrombocytopenia ([place]) [D69.6] Acute on chronic pancreatitis ([place]) [K85.90, K86.1] Alcohol-induced acute pancreatitis, unspecified complication status [id number] Patient Active Problem List   Diagnosis Date Noted   AKI (acute kidney injury) ([place]) 04/04/2021   Left flank pain 04/04/2021   Acute on chronic pancreatitis ([place]) 04/04/2021   Transaminitis 04/04/2021   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[place]) 12/18/2020   Episodic mood disorder ([place]) 08/08/2020  Left leg pain 03/28/2020   History of recent fall 03/28/2020   Abnormal  urine 12/13/2019   History of alcohol abuse 09/07/2019   Insomnia 05/05/2019   Dizziness 04/02/2019   Bruising 04/02/2019   Hyponatremia 04/02/2019   Head injury    Scalp laceration, initial encounter    Fall 01/20/2019   Epistaxis 01/20/2019   Altered mental status    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[place]) 12/05/2018   Other mixed anxiety disorders 10/27/2018   Neuropathy 10/27/2018   Abscess of bursa of left elbow 09/23/2018   Olecranon bursitis of left elbow    Erysipelas [id number]   Acute metabolic encephalopathy 0000000   Overdose 08/22/2018   Hypotension 08/22/2018   Seizure ([place]) 08/22/2018   Substance induced mood disorder ([place]) 07/17/2018   Cocaine dependence without complication ([place]) [id number]   Polysubstance abuse ([place]) 04/08/2018   Encephalopathy, 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[place]) 04/08/2018   Hepatitis C 03/18/2018   Portal hypertension ([place]) [id number]   Alcoholic intoxication without complication ([place]) [id number]   Depression 03/18/2018   Upper GI bleed 02/10/2018   Alcohol use disorder, severe, in early remission ([place]) 02/10/2018   Chronic anemia    Thrombocytopenia due to drugs    Suicidal ideation    Thrombocytopenia ([place]) 01/02/2018   GERD (gastroesophageal reflux disease) 11/01/2017   Trichimoniasis 10/30/2017   Alcohol dependence ([name]) 10/29/2017   Acute hyperactive alcohol withdrawal delirium ([place]) 10/09/2017   Alcohol abuse with alcohol-induced mood disorder ([place])    Alcohol withdrawal syndrome with complication ([place]) [id number]   Esophageal varices without bleeding ([place])    Hematemesis 09/01/2017   Ascites due to alcoholic cirrhosis (HCC)    Decompensated hepatic cirrhosis ([place]) 07/23/2017   Alcohol abuse 07/23/2017   Hepatitis C antibody positive in blood 07/23/2017   Hypokalemia 07/23/2017   Jaundice 07/23/2017   Coagulopathy ([place]) 07/23/2017   Hypomagnesemia 07/23/2017   Pancytopenia ([place]) [id number]   Alcoholic cirrhosis of liver with ascites ([place])  07/23/2017   Bacterial vaginosis 06/03/2017   UTI (urinary tract infection) 06/02/2017   PCP:  [name], FNP (Inactive) Pharmacy:   [id number] [address]Phone: [phone]: [phone]    Social Determinants of Health (SDOH) Interventions    Readmission Risk Interventions Readmission Risk Prevention Plan 07/06/2019 04/12/2019  Transportation Screening Complete Complete  Medication Review ([place] Complete Complete  PCP or Specialist appointment within 3-5 days of discharge Complete Not Complete  PCP/Specialist Appt Not Complete comments - not ready for d/c  HRI or [place] Complete  SW Recovery Care/Counseling Consult Complete Complete  Palliative Care Screening Not Applicable Not Applicable  Skilled Nursing Facility Not Applicable Not Applicable  Some recent data might be hidden
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
## 2021-04-05 NOTE — Progress Notes (Signed)
Subjective: Still back pain. No abdominal pain.  Objective: Vital signs in last 24 hours: Temp:  [97.8 F (36.6 C)-98.5 F (36.9 C)] 97.8 F (36.6 C) (07/28 0335) Pulse Rate:  [82-101] 100 (07/28 0335) Resp:  [14-20] 20 (07/28 0335) BP: (98-128)/(74-88) 119/76 (07/28 0335) SpO2:  [96 %-100 %] 96 % (07/28 0335) Weight change:  Last BM Date: 04/04/21  PE: GEN:  NAD, older-appearing than stated age ABD:  Soft, non-tender  Lab Results: CBC    Component Value Date/Time   WBC 2.8 (L) 04/05/2021 0514   RBC 4.25 04/05/2021 0514   HGB 12.8 04/05/2021 0514   HGB 14.3 11/15/2020 1034   HGB 14.4 01/04/2020 1510   HCT 39.5 04/05/2021 0514   HCT 43.4 01/04/2020 1510   PLT 23 (LL) 04/05/2021 0514   PLT 121 (L) 11/15/2020 1034   PLT 60 (LL) 01/04/2020 1510   MCV 92.9 04/05/2021 0514   MCV 93 03/19/2021 0000   MCH 30.1 04/05/2021 0514   MCHC 32.4 04/05/2021 0514   RDW 17.5 (H) 04/05/2021 0514   RDW 16.0 (H) 01/04/2020 1510   LYMPHSABS 0.7 04/04/2021 0245   LYMPHSABS 0.9 01/04/2020 1510   MONOABS 0.7 04/04/2021 0245   EOSABS 0.3 04/04/2021 0245   EOSABS 0.1 01/04/2020 1510   BASOSABS 0.0 04/04/2021 0245   BASOSABS 0.0 01/04/2020 1510  CMP     Component Value Date/Time   NA 134 (L) 04/05/2021 0514   NA 137 12/25/2020 0000   K 3.1 (L) 04/05/2021 0514   CL 99 04/05/2021 0514   CO2 27 04/05/2021 0514   GLUCOSE 103 (H) 04/05/2021 0514   BUN 13 04/05/2021 0514   BUN 10 12/25/2020 0000   CREATININE 0.62 04/05/2021 0514   CREATININE 0.64 11/15/2020 1034   CREATININE 0.54 05/01/2020 1043   CALCIUM 8.2 (L) 04/05/2021 0514   PROT 7.1 04/05/2021 1013   PROT 8.3 01/04/2020 1510   ALBUMIN 3.1 (L) 04/05/2021 1013   ALBUMIN 3.9 01/04/2020 1510   AST 328 (H) 04/05/2021 1013   AST 30 11/15/2020 1034   ALT 117 (H) 04/05/2021 1013   ALT 17 11/15/2020 1034   ALKPHOS 161 (H) 04/05/2021 1013   BILITOT 3.1 (H) 04/05/2021 1013   BILITOT 1.6 (H) 11/15/2020 1034   GFRNONAA >60 04/05/2021  0514   GFRNONAA >60 11/15/2020 1034   GFRNONAA 115 04/22/2019 1305   GFRAA >60 06/07/2020 0941   GFRAA 133 04/22/2019 1305   Assessment:   Alcohol-mediated cirrhosis, complicated by hepatic encephalopathy and portal hypertension with thrombocytopenia. Elevated lipase.  Not convinced this is pancreatitis based on lack of abdominal pain and negative CT scan.  Plan:   Follow LFTs. Advance diet as tolerated. Alcohol cessation needed. Hopefully home in next day or two, and can follow-up with [facility] upon hospital discharge.   Landry Dyke 04/05/2021, 11:59 AM   Cell [phone] If no answer or after 5 PM call [phone]
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
## 2021-04-06 DIAGNOSIS — R109 Unspecified abdominal pain: Secondary | ICD-10-CM | POA: Diagnosis not present

## 2021-04-06 DIAGNOSIS — M5442 Lumbago with sciatica, left side: Secondary | ICD-10-CM

## 2021-04-06 DIAGNOSIS — D693 Immune thrombocytopenic purpura: Secondary | ICD-10-CM

## 2021-04-06 DIAGNOSIS — K852 Alcohol induced acute pancreatitis without necrosis or infection: Secondary | ICD-10-CM

## 2021-04-06 DIAGNOSIS — N179 Acute kidney failure, unspecified: Secondary | ICD-10-CM | POA: Diagnosis not present

## 2021-04-06 DIAGNOSIS — M5441 Lumbago with sciatica, right side: Secondary | ICD-10-CM

## 2021-04-06 DIAGNOSIS — K859 Acute pancreatitis without necrosis or infection, unspecified: Secondary | ICD-10-CM | POA: Diagnosis not present

## 2021-04-06 LAB — CBC WITH DIFFERENTIAL/PLATELET
Abs Immature Granulocytes: 0.01 10*3/uL (ref 0.00–0.07)
Basophils Absolute: 0 10*3/uL (ref 0.0–0.1)
Basophils Relative: 0 %
Eosinophils Absolute: 0.1 10*3/uL (ref 0.0–0.5)
Eosinophils Relative: 5 %
HCT: 39.8 % (ref 36.0–46.0)
Hemoglobin: 12.8 g/dL (ref 12.0–15.0)
Immature Granulocytes: 0 %
Lymphocytes Relative: 20 %
Lymphs Abs: 0.6 10*3/uL — ABNORMAL LOW (ref 0.7–4.0)
MCH: 30.2 pg (ref 26.0–34.0)
MCHC: 32.2 g/dL (ref 30.0–36.0)
MCV: 93.9 fL (ref 80.0–100.0)
Monocytes Absolute: 0.7 10*3/uL (ref 0.1–1.0)
Monocytes Relative: 24 %
Neutro Abs: 1.4 10*3/uL — ABNORMAL LOW (ref 1.7–7.7)
Neutrophils Relative %: 51 %
Platelets: 23 10*3/uL — CL (ref 150–400)
RBC: 4.24 MIL/uL (ref 3.87–5.11)
RDW: 18 % — ABNORMAL HIGH (ref 11.5–15.5)
WBC: 2.8 10*3/uL — ABNORMAL LOW (ref 4.0–10.5)
nRBC: 0 % (ref 0.0–0.2)

## 2021-04-06 LAB — COMPREHENSIVE METABOLIC PANEL
ALT: 109 U/L — ABNORMAL HIGH (ref 0–44)
AST: 277 U/L — ABNORMAL HIGH (ref 15–41)
Albumin: 2.9 g/dL — ABNORMAL LOW (ref 3.5–5.0)
Alkaline Phosphatase: 181 U/L — ABNORMAL HIGH (ref 38–126)
Anion gap: 5 (ref 5–15)
BUN: 7 mg/dL (ref 6–20)
CO2: 25 mmol/L (ref 22–32)
Calcium: 8.4 mg/dL — ABNORMAL LOW (ref 8.9–10.3)
Chloride: 102 mmol/L (ref 98–111)
Creatinine, Ser: 0.45 mg/dL (ref 0.44–1.00)
GFR, Estimated: >60 mL/min (ref >60)
Glucose, Bld: 95 mg/dL (ref 70–99)
Potassium: 4.6 mmol/L (ref 3.5–5.1)
Sodium: 132 mmol/L — ABNORMAL LOW (ref 135–145)
Total Bilirubin: 2.3 mg/dL — ABNORMAL HIGH (ref 0.3–1.2)
Total Protein: 6.8 g/dL (ref 6.5–8.1)

## 2021-04-06 LAB — MAGNESIUM: Magnesium: 1.8 mg/dL (ref 1.7–2.4)

## 2021-04-06 LAB — PHOSPHORUS: Phosphorus: 1.6 mg/dL — ABNORMAL LOW (ref 2.5–4.6)

## 2021-04-06 LAB — LIPASE, BLOOD: Lipase: 362 U/L — ABNORMAL HIGH (ref 11–51)

## 2021-04-06 LAB — CK: Total CK: 1542 U/L — ABNORMAL HIGH (ref 38–234)

## 2021-04-06 MED ORDER — MAGNESIUM SULFATE 2 GM/50ML IV SOLN
2.0000 g | Freq: Once | INTRAVENOUS | Status: AC
  Administered 2021-04-06: 2 g via INTRAVENOUS
  Filled 2021-04-06: qty 50

## 2021-04-06 MED ORDER — POTASSIUM & SODIUM PHOSPHATES 280-160-250 MG PO PACK
1.0000 | PACK | Freq: Three times a day (TID) | ORAL | Status: DC
  Administered 2021-04-06 – 2021-04-08 (×8): 1 via ORAL
  Filled 2021-04-06 (×10): qty 1

## 2021-04-06 MED ORDER — LIDOCAINE 5 % EX PTCH
1.0000 | MEDICATED_PATCH | CUTANEOUS | Status: DC
  Administered 2021-04-06 – 2021-04-08 (×3): 1 via TRANSDERMAL
  Filled 2021-04-06 (×3): qty 1

## 2021-04-06 MED ORDER — DICLOFENAC SODIUM 1 % EX GEL
2.0000 g | Freq: Four times a day (QID) | CUTANEOUS | Status: DC
  Administered 2021-04-06 – 2021-04-08 (×9): 2 g via TOPICAL
  Filled 2021-04-06: qty 100

## 2021-04-06 NOTE — TOC Progression Note (Signed)
Transition of Care [facility]) - Progression Note    Patient Details  Name: Briana Sweeney MRN: [id number] Date of Birth: August 24, 1971  Transition of Care [facility]) CM/SW Contact  Antwion Carpenter, Juliann Pulse, RN Phone Number: 04/06/2021, 12:48 PM  Clinical Narrative: Pt recc otpt PT-referral placed to [facility] see d/c follow up. Assess if bus pass needed.      Expected Discharge Plan: OP Rehab Barriers to Discharge: No Barriers Identified, Continued Medical Work up  Expected Discharge Plan and Services Expected Discharge Plan: OP Rehab   Discharge Planning Services: CM Consult   Living arrangements for the past 2 months: Single Family Home                                       Social Determinants of Health (SDOH) Interventions    Readmission Risk Interventions Readmission Risk Prevention Plan 07/06/2019 04/12/2019  Transportation Screening Complete Complete  Medication Review Press photographer) Complete Complete  PCP or Specialist appointment within 3-5 days of discharge Complete Not Complete  PCP/Specialist Appt Not Complete comments - not ready for d/c  HRI or [place] Complete Complete  SW Recovery Care/Counseling Consult Complete Complete  Palliative Care Screening Not Applicable Not [place] Not Applicable Not Applicable  Some recent data might be hidden

## 2021-04-06 NOTE — Progress Notes (Signed)
Subjective: No abdominal pain. Tolerating diet.  Objective: Vital signs in last 24 hours: Temp:  [98 F (36.7 C)] 98 F (36.7 C) (07/29 0434) Pulse Rate:  [95] 95 (07/29 0434) Resp:  [20] 20 (07/29 0434) BP: (117)/(74) 117/74 (07/29 0434) SpO2:  [95 %] 95 % (07/29 0434) Weight change:  Last BM Date: 04/04/21  PE: GEN:  NAD  Lab Results:  CBC    Component Value Date/Time   WBC 2.8 (L) 04/06/2021 0521   RBC 4.24 04/06/2021 0521   HGB 12.8 04/06/2021 0521   HGB 14.3 11/15/2020 1034   HGB 14.4 01/04/2020 1510   HCT 39.8 04/06/2021 0521   HCT 43.4 01/04/2020 1510   PLT 23 (LL) 04/06/2021 0521   PLT 121 (L) 11/15/2020 1034   PLT 60 (LL) 01/04/2020 1510   MCV 93.9 04/06/2021 0521   MCV 93 03/19/2021 0000   MCH 30.2 04/06/2021 0521   MCHC 32.2 04/06/2021 0521   RDW 18.0 (H) 04/06/2021 0521   RDW 16.0 (H) 01/04/2020 1510   LYMPHSABS 0.6 (L) 04/06/2021 0521   LYMPHSABS 0.9 01/04/2020 1510   MONOABS 0.7 04/06/2021 0521   EOSABS 0.1 04/06/2021 0521   EOSABS 0.1 01/04/2020 1510   BASOSABS 0.0 04/06/2021 0521   BASOSABS 0.0 01/04/2020 1510  CMP     Component Value Date/Time   NA 132 (L) 04/06/2021 0521   NA 137 12/25/2020 0000   K 4.6 04/06/2021 0521   CL 102 04/06/2021 0521   CO2 25 04/06/2021 0521   GLUCOSE 95 04/06/2021 0521   BUN 7 04/06/2021 0521   BUN 10 12/25/2020 0000   CREATININE 0.45 04/06/2021 0521   CREATININE 0.64 11/15/2020 1034   CREATININE 0.54 05/01/2020 1043   CALCIUM 8.4 (L) 04/06/2021 0521   PROT 6.8 04/06/2021 0521   PROT 8.3 01/04/2020 1510   ALBUMIN 2.9 (L) 04/06/2021 0521   ALBUMIN 3.9 01/04/2020 1510   AST 277 (H) 04/06/2021 0521   AST 30 11/15/2020 1034   ALT 109 (H) 04/06/2021 0521   ALT 17 11/15/2020 1034   ALKPHOS 181 (H) 04/06/2021 0521   BILITOT 2.3 (H) 04/06/2021 0521   BILITOT 1.6 (H) 11/15/2020 1034   GFRNONAA >60 04/06/2021 0521   GFRNONAA >60 11/15/2020 1034   GFRNONAA 115 04/22/2019 1305   GFRAA >60 06/07/2020 0941    GFRAA 133 04/22/2019 1305   Assessment:   Alcohol-mediated cirrhosis, complicated by hepatic encephalopathy and portal hypertension with thrombocytopenia. Elevated lipase.  Not convinced this is pancreatitis based on lack of abdominal pain and negative CT scan.  Plan:   OK to d/c home from GI perspective.  GI d/c meds:  lactulose at current dose, pantoprazole 40 mg po qd. Alcohol cessation.  Follow-up with Dr. [name] [facility]) ongoing management of thrombocytopenia.  [facility] will make outpatient follow-up appt.  [facility] will sign-off; please call with questions; thank you for the consultation.   Briana Sweeney 04/06/2021, 1:14 PM   Cell [phone] If no answer or after 5 PM call [phone]
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
## 2021-04-07 DIAGNOSIS — N179 Acute kidney failure, unspecified: Secondary | ICD-10-CM | POA: Diagnosis not present

## 2021-04-07 DIAGNOSIS — K852 Alcohol induced acute pancreatitis without necrosis or infection: Secondary | ICD-10-CM | POA: Diagnosis not present

## 2021-04-07 DIAGNOSIS — K859 Acute pancreatitis without necrosis or infection, unspecified: Secondary | ICD-10-CM | POA: Diagnosis not present

## 2021-04-07 DIAGNOSIS — R109 Unspecified abdominal pain: Secondary | ICD-10-CM | POA: Diagnosis not present

## 2021-04-07 LAB — COMPREHENSIVE METABOLIC PANEL
ALT: 92 U/L — ABNORMAL HIGH (ref 0–44)
AST: 192 U/L — ABNORMAL HIGH (ref 15–41)
Albumin: 2.6 g/dL — ABNORMAL LOW (ref 3.5–5.0)
Alkaline Phosphatase: 190 U/L — ABNORMAL HIGH (ref 38–126)
Anion gap: 4 — ABNORMAL LOW (ref 5–15)
BUN: 8 mg/dL (ref 6–20)
CO2: 24 mmol/L (ref 22–32)
Calcium: 8.4 mg/dL — ABNORMAL LOW (ref 8.9–10.3)
Chloride: 105 mmol/L (ref 98–111)
Creatinine, Ser: 0.5 mg/dL (ref 0.44–1.00)
GFR, Estimated: >60 mL/min (ref >60)
Glucose, Bld: 95 mg/dL (ref 70–99)
Potassium: 4.3 mmol/L (ref 3.5–5.1)
Sodium: 133 mmol/L — ABNORMAL LOW (ref 135–145)
Total Bilirubin: 1.7 mg/dL — ABNORMAL HIGH (ref 0.3–1.2)
Total Protein: 6.3 g/dL — ABNORMAL LOW (ref 6.5–8.1)

## 2021-04-07 LAB — CBC WITH DIFFERENTIAL/PLATELET
Abs Immature Granulocytes: 0.01 10*3/uL (ref 0.00–0.07)
Basophils Absolute: 0 10*3/uL (ref 0.0–0.1)
Basophils Relative: 0 %
Eosinophils Absolute: 0.1 10*3/uL (ref 0.0–0.5)
Eosinophils Relative: 5 %
HCT: 38.9 % (ref 36.0–46.0)
Hemoglobin: 12.6 g/dL (ref 12.0–15.0)
Immature Granulocytes: 0 %
Lymphocytes Relative: 19 %
Lymphs Abs: 0.5 10*3/uL — ABNORMAL LOW (ref 0.7–4.0)
MCH: 31 pg (ref 26.0–34.0)
MCHC: 32.4 g/dL (ref 30.0–36.0)
MCV: 95.8 fL (ref 80.0–100.0)
Monocytes Absolute: 0.5 10*3/uL (ref 0.1–1.0)
Monocytes Relative: 20 %
Neutro Abs: 1.5 10*3/uL — ABNORMAL LOW (ref 1.7–7.7)
Neutrophils Relative %: 56 %
Platelets: 21 10*3/uL — CL (ref 150–400)
RBC: 4.06 MIL/uL (ref 3.87–5.11)
RDW: 18.4 % — ABNORMAL HIGH (ref 11.5–15.5)
WBC: 2.7 10*3/uL — ABNORMAL LOW (ref 4.0–10.5)
nRBC: 0 % (ref 0.0–0.2)

## 2021-04-07 LAB — PHOSPHORUS: Phosphorus: 3.2 mg/dL (ref 2.5–4.6)

## 2021-04-07 LAB — CK: Total CK: 657 U/L — ABNORMAL HIGH (ref 38–234)

## 2021-04-07 LAB — MAGNESIUM: Magnesium: 1.8 mg/dL (ref 1.7–2.4)

## 2021-04-07 MED ORDER — HYDROMORPHONE HCL 1 MG/ML IJ SOLN
0.5000 mg | INTRAMUSCULAR | Status: DC | PRN
Start: 2021-04-07 — End: 2021-04-08
  Administered 2021-04-07 – 2021-04-08 (×4): 0.5 mg via INTRAVENOUS
  Filled 2021-04-07 (×4): qty 0.5
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
## 2021-04-08 DIAGNOSIS — M5442 Lumbago with sciatica, left side: Secondary | ICD-10-CM | POA: Diagnosis not present

## 2021-04-08 DIAGNOSIS — R109 Unspecified abdominal pain: Secondary | ICD-10-CM | POA: Diagnosis not present

## 2021-04-08 DIAGNOSIS — N179 Acute kidney failure, unspecified: Secondary | ICD-10-CM | POA: Diagnosis not present

## 2021-04-08 DIAGNOSIS — K859 Acute pancreatitis without necrosis or infection, unspecified: Secondary | ICD-10-CM | POA: Diagnosis not present

## 2021-04-08 LAB — COMPREHENSIVE METABOLIC PANEL
ALT: 76 U/L — ABNORMAL HIGH (ref 0–44)
AST: 131 U/L — ABNORMAL HIGH (ref 15–41)
Albumin: 2.6 g/dL — ABNORMAL LOW (ref 3.5–5.0)
Alkaline Phosphatase: 153 U/L — ABNORMAL HIGH (ref 38–126)
Anion gap: 8 (ref 5–15)
BUN: 8 mg/dL (ref 6–20)
CO2: 23 mmol/L (ref 22–32)
Calcium: 8.8 mg/dL — ABNORMAL LOW (ref 8.9–10.3)
Chloride: 104 mmol/L (ref 98–111)
Creatinine, Ser: 0.51 mg/dL (ref 0.44–1.00)
GFR, Estimated: >60 mL/min (ref >60)
Glucose, Bld: 98 mg/dL (ref 70–99)
Potassium: 4.1 mmol/L (ref 3.5–5.1)
Sodium: 135 mmol/L (ref 135–145)
Total Bilirubin: 1.4 mg/dL — ABNORMAL HIGH (ref 0.3–1.2)
Total Protein: 6.1 g/dL — ABNORMAL LOW (ref 6.5–8.1)

## 2021-04-08 LAB — PHOSPHORUS: Phosphorus: 3.4 mg/dL (ref 2.5–4.6)

## 2021-04-08 LAB — CK: Total CK: 381 U/L — ABNORMAL HIGH (ref 38–234)

## 2021-04-08 LAB — CBC WITH DIFFERENTIAL/PLATELET
Abs Immature Granulocytes: 0.01 10*3/uL (ref 0.00–0.07)
Basophils Absolute: 0 10*3/uL (ref 0.0–0.1)
Basophils Relative: 1 %
Eosinophils Absolute: 0.2 10*3/uL (ref 0.0–0.5)
Eosinophils Relative: 6 %
HCT: 38.2 % (ref 36.0–46.0)
Hemoglobin: 12.4 g/dL (ref 12.0–15.0)
Immature Granulocytes: 0 %
Lymphocytes Relative: 20 %
Lymphs Abs: 0.6 10*3/uL — ABNORMAL LOW (ref 0.7–4.0)
MCH: 30.8 pg (ref 26.0–34.0)
MCHC: 32.5 g/dL (ref 30.0–36.0)
MCV: 95 fL (ref 80.0–100.0)
Monocytes Absolute: 0.6 10*3/uL (ref 0.1–1.0)
Monocytes Relative: 21 %
Neutro Abs: 1.5 10*3/uL — ABNORMAL LOW (ref 1.7–7.7)
Neutrophils Relative %: 52 %
Platelets: 27 10*3/uL — CL (ref 150–400)
RBC: 4.02 MIL/uL (ref 3.87–5.11)
RDW: 18.8 % — ABNORMAL HIGH (ref 11.5–15.5)
WBC: 2.8 10*3/uL — ABNORMAL LOW (ref 4.0–10.5)
nRBC: 0 % (ref 0.0–0.2)

## 2021-04-08 LAB — LIPASE, BLOOD: Lipase: 242 U/L — ABNORMAL HIGH (ref 11–51)

## 2021-04-08 LAB — MAGNESIUM: Magnesium: 1.7 mg/dL (ref 1.7–2.4)

## 2021-04-08 MED ORDER — ONDANSETRON HCL 4 MG PO TABS: 4.0000 mg | ORAL_TABLET | Freq: Four times a day (QID) | ORAL | 0 refills | Status: DC | PRN

## 2021-04-08 MED ORDER — LIDOCAINE 5 % EX PTCH: 1.0000 | MEDICATED_PATCH | CUTANEOUS | 0 refills | Status: DC

## 2021-04-08 MED ORDER — OXYCODONE HCL 5 MG PO TABS: 5.0000 mg | ORAL_TABLET | Freq: Four times a day (QID) | ORAL | 0 refills | Status: DC | PRN

## 2021-04-08 MED ORDER — LACTULOSE 10 GM/15ML PO SOLN
20.0000 g | Freq: Three times a day (TID) | ORAL | 0 refills | Status: DC
Start: 2021-04-08 — End: 2021-11-01

## 2021-04-08 MED ORDER — ADULT MULTIVITAMIN W/MINERALS CH: 1.0000 | ORAL_TABLET | Freq: Every day | ORAL | 0 refills | Status: AC

## 2021-04-08 NOTE — Discharge Summary (Signed)
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 1200  BP: 101/66 130/87 130/84 126/78  Pulse: 87 85 (!) 106 90  Resp:  12 16   Temp:  98.2 F (36.8 C) 98 F (36.7 C)   TempSrc:  Oral Oral   SpO2:  97% 93%   Weight:      Height:       General: Pt is alert, awake, not in acute distress Cardiovascular: RRR, S1/S2 +, no rubs, no gallops Respiratory: Diminished bilaterally, no wheezing, no rhonchi; unlabored breathing Abdominal: Soft, NT, distended slightly secondary body habitus, bowel sounds + Extremities: Minimal edema, no cyanosis  The results of significant diagnostics from this hospitalization (including imaging, microbiology, ancillary and laboratory) are listed below for reference.    Microbiology: Recent Results (from the past 240 hour(s))  Resp Panel by RT-PCR (Flu A&B, Covid) Nasopharyngeal Swab     Status: None   Collection Time: 04/04/21  8:21 AM   Specimen: Nasopharyngeal Swab; Nasopharyngeal(NP) swabs in vial transport medium   Result Value Ref Range Status   SARS Coronavirus 2 by RT PCR NEGATIVE NEGATIVE Final    Comment: (NOTE) SARS-CoV-2 target nucleic acids are NOT DETECTED.  The SARS-CoV-2 RNA is generally detectable in upper respiratory specimens during the acute phase of infection. The lowest concentration of SARS-CoV-2 viral copies this assay can detect is 138 copies/mL. A negative result does not preclude SARS-Cov-2 infection and should not be used as the sole basis for treatment or other patient management decisions. A  negative result may occur with  improper specimen collection/handling, submission of specimen other than nasopharyngeal swab, presence of viral mutation(s) within the areas targeted by this assay, and inadequate number of viral copies(<138 copies/mL). A negative result must be combined with clinical observations, patient history, and epidemiological information. The expected result is Negative.  Fact Sheet for Patients:  EntrepreneurPulse.com.au  Fact Sheet for Healthcare Providers:  IncredibleEmployment.be  This test is no t yet approved or cleared by the [place] FDA and  has been authorized for detection and/or diagnosis of SARS-CoV-2 by FDA under an Emergency Use Authorization (EUA). This EUA will remain  in effect (meaning this test can be used) for the duration of the COVID-19 declaration under Section 564(b)(1) of the Act, 21 U.S.C.section 360bbb-3(b)(1), unless the authorization is terminated  or revoked sooner.       Influenza A by PCR NEGATIVE NEGATIVE Final   Influenza B by PCR NEGATIVE NEGATIVE Final    Comment: (NOTE) The Xpert Xpress SARS-CoV-2/FLU/RSV plus assay is intended as an aid in the diagnosis of influenza from Nasopharyngeal swab specimens and should not be used as a sole basis for treatment. Nasal washings and aspirates are unacceptable for Xpert Xpress SARS-CoV-2/FLU/RSV testing.  Fact Sheet for  Patients: EntrepreneurPulse.com.au  Fact Sheet for Healthcare Providers: IncredibleEmployment.be  This test is not yet approved or cleared by the [place] FDA and has been authorized for detection and/or diagnosis of SARS-CoV-2 by FDA under an Emergency Use Authorization (EUA). This EUA will remain in effect (meaning this test can be used) for the duration of the COVID-19 declaration under Section 564(b)(1) of the Act, 21 U.S.C. section 360bbb-3(b)(1), unless the authorization is terminated or revoked.  Performed at [facility], [place] [address] [place], [place] [address]     Labs: BNP (last 3 results) No results for input(s): BNP in the last 8760 hours. Basic Metabolic Panel: Recent Labs  Lab 04/04/21 0205 04/04/21 1258 04/05/21 0514 04/06/21 0521 04/07/21 0551 04/08/21 0542  NA 140  --  134* 132* 133* 135  K 4.4  --  3.1* 4.6 4.3 4.1  CL 101  --  99 102 105 104  CO2 24  --  '27 25 24 23  '$ GLUCOSE 97  --  103* 95 95 98  BUN 24*  --  '13 7 8 8  '$ CREATININE 1.28*  --  0.62 0.45 0.50 0.51  CALCIUM 8.9  --  8.2* 8.4* 8.4* 8.8*  MG  --  1.7  --  1.8 1.8 1.7  PHOS  --   --   --  1.6* 3.2 3.4   Liver Function Tests: Recent Labs  Lab 04/04/21 0205 04/05/21 1013 04/06/21 0521 04/07/21 0551 04/08/21 0542  AST 415* 328* 277* 192* 131*  ALT 127* 117* 109* 92* 76*  ALKPHOS 175* 161* 181* 190* 153*  BILITOT 4.3* 3.1* 2.3* 1.7* 1.4*  PROT 7.7 7.1 6.8 6.3* 6.1*  ALBUMIN 3.4* 3.1* 2.9* 2.6* 2.6*   Recent Labs  Lab 04/04/21 0205 04/05/21 0514 04/06/21 0825 04/08/21 0542  LIPASE 724* 426* 362* 242*   Recent Labs  Lab 04/05/21 1013  AMMONIA 69*   CBC: Recent Labs  Lab 04/02/21 0000 04/04/21 0245 04/05/21 0514 04/06/21 0521 04/07/21 0551 04/08/21 0542  WBC 7.2 4.3 2.8* 2.8* 2.7* 2.8*  NEUTROABS 4.90 2.6  --  1.4* 1.5* 1.5*  HGB 14.5 13.8 12.8 12.8 12.6 12.4  HCT 44 42.5 39.5 39.8 38.9 38.2  MCV  --  92.8 92.9 93.9 95.8 95.0  PLT 33* 24* 23* 23* 21* 27*   Cardiac Enzymes: Recent Labs  Lab 04/04/21 1258 04/06/21 0825 04/07/21 0551 04/08/21 0542  CKTOTAL 6,308* 1,542* 657* 381*   BNP: Invalid input(s): POCBNP CBG: No results for input(s): GLUCAP in the last 168 hours. D-Dimer No results for input(s): DDIMER in the last 72 hours. Hgb A1c No results for input(s): HGBA1C in the last 72 hours. Lipid Profile No results for input(s): CHOL, HDL, LDLCALC, TRIG, CHOLHDL, LDLDIRECT in the last 72 hours. Thyroid function studies No results for input(s): TSH, T4TOTAL, T3FREE, THYROIDAB in the last 72 hours.  Invalid input(s): FREET3 Anemia work up No results for input(s): VITAMINB12, FOLATE, FERRITIN, TIBC, IRON, RETICCTPCT in the last 72 hours. Urinalysis    Component Value Date/Time   COLORURINE AMBER (A) 04/04/2021 0926   APPEARANCEUR HAZY (A) 04/04/2021 0926   LABSPEC 1.017 04/04/2021 0926   PHURINE 5.0 04/04/2021 0926   GLUCOSEU NEGATIVE 04/04/2021 0926   HGBUR SMALL (A) 04/04/2021 0926   BILIRUBINUR NEGATIVE 04/04/2021 0926   BILIRUBINUR NEG 05/26/2020 1544   KETONESUR 5 (A) 04/04/2021 0926   PROTEINUR NEGATIVE 04/04/2021 0926   UROBILINOGEN 1.0 05/26/2020 1544   UROBILINOGEN 0.2 12/02/2017 0836   NITRITE NEGATIVE 04/04/2021 0926   LEUKOCYTESUR MODERATE (A) 04/04/2021 0926   Sepsis Labs Invalid input(s): PROCALCITONIN,  WBC,  LACTICIDVEN Microbiology Recent Results (from the past 240 hour(s))  Resp Panel by RT-PCR (Flu A&B, Covid) Nasopharyngeal Swab     Status: None   Collection Time: 04/04/21  8:21 AM   Specimen: Nasopharyngeal Swab; Nasopharyngeal(NP) swabs in vial transport medium  Result Value Ref Range Status   SARS Coronavirus 2 by RT PCR NEGATIVE NEGATIVE Final    Comment: (NOTE) SARS-CoV-2 target nucleic acids are NOT DETECTED.  The SARS-CoV-2 RNA is generally detectable in upper respiratory specimens during the acute phase of infection. The  lowest concentration of SARS-CoV-2 viral copies this assay can detect is 138 copies/mL. A negative result does not preclude SARS-Cov-2 infection and should not be used as the sole basis for treatment or other patient management decisions. A negative result may occur with  improper specimen collection/handling, submission of specimen other than nasopharyngeal swab, presence of viral mutation(s) within the areas targeted by this assay, and inadequate number of viral copies(<138 copies/mL). A negative result must be combined with clinical observations, patient history, and epidemiological information. The expected result is Negative.  Fact Sheet for Patients:  EntrepreneurPulse.com.au  Fact Sheet for Healthcare Providers:  IncredibleEmployment.be  This test is no t yet approved or cleared by the [place] FDA and  has been authorized for detection and/or diagnosis of SARS-CoV-2 by FDA under an Emergency Use Authorization (EUA). This EUA will remain  in effect (meaning this test can be used) for the duration of the COVID-19 declaration under Section 564(b)(1) of the Act, 21 U.S.C.section 360bbb-3(b)(1), unless the authorization is terminated  or revoked sooner.       Influenza A by PCR NEGATIVE NEGATIVE Final   Influenza B by PCR NEGATIVE NEGATIVE Final    Comment: (NOTE) The Xpert Xpress SARS-CoV-2/FLU/RSV plus assay is intended as an aid in the diagnosis of influenza from Nasopharyngeal swab specimens and should not be used as a sole basis for treatment. Nasal washings and aspirates are unacceptable for Xpert Xpress SARS-CoV-2/FLU/RSV testing.  Fact Sheet for Patients: EntrepreneurPulse.com.au  Fact Sheet for Healthcare Providers: IncredibleEmployment.be  This test is not yet approved or cleared by the [place]  States FDA and has been authorized for detection and/or diagnosis of SARS-CoV-2 by FDA under  an Emergency Use Authorization (EUA). This EUA will remain in effect (meaning this test can be used) for the duration of the COVID-19 declaration under Section 564(b)(1) of the Act, 21 U.S.C. section 360bbb-3(b)(1), unless the authorization is terminated or revoked.  Performed at [facility], [place] [address], [place], [place] [address]    Time coordinating discharge: 35 minutes  SIGNED:  Kerney Elbe, DO [facility] 04/08/2021, 4:23 PM Pager is on [place]  If 7PM-7AM, please contact night-coverage [email or web address]

## 2021-04-08 NOTE — Progress Notes (Signed)
Pt d/c instruction given. Verbalized understanding. Safe ride to transport. Will be here at 1410. NT taking pt down now.

## 2021-04-08 NOTE — TOC Progression Note (Signed)
Transition of Care [facility]) - Progression Note    Patient Details  Name: Matison Stoneback MRN: [id number] Date of Birth: 1971-07-16  [facility]) CM/SW Contact  Zakiyyah Savannah, Marta Lamas, [place] Phone Number: 04/08/2021, 1:55 PM  Clinical Narrative:     [facility] have been arranged for patient to transport home.  Patient and attending nurse, Levada Dy [phone]) have both been notified.   Expected Discharge Plan: OP Rehab Barriers to Discharge: No Barriers Identified, Continued Medical Work up  Expected Discharge Plan and Services Expected Discharge Plan: OP Rehab   Discharge Planning Services: CM Consult   Living arrangements for the past 2 months: Single Family Home Expected Discharge Date: 04/08/21                     Social Determinants of Health (SDOH) Interventions    Readmission Risk Interventions Readmission Risk Prevention Plan 07/06/2019 04/12/2019  Transportation Screening Complete Complete  Medication Review Press photographer) Complete Complete  PCP or Specialist appointment within 3-5 days of discharge Complete Not Complete  PCP/Specialist Appt Not Complete comments - not ready for d/c  HRI or [place] Complete Complete  SW Recovery Care/Counseling Consult Complete Complete  Palliative Care Screening Not Applicable Not [place] Not Applicable Not Applicable  Some recent data might be hidden   Nat Christen, BSW, MSW, [employer]  Licensed Clinical Social Worker  [employer]  Mailing [place] [address] [place], [place] [address] Physical Address-[address], [place], [place] [address] Toll Free Main # [phone] Fax # [phone] Cell # [phone]  [email or web address]

## 2021-04-09 ENCOUNTER — Inpatient Hospital Stay: Payer: Medicaid Other | Attending: Hematology & Oncology

## 2021-04-09 ENCOUNTER — Other Ambulatory Visit: Payer: Self-pay

## 2021-04-09 ENCOUNTER — Encounter: Payer: Self-pay | Admitting: Hematology and Oncology

## 2021-04-09 DIAGNOSIS — D696 Thrombocytopenia, unspecified: Secondary | ICD-10-CM

## 2021-04-09 DIAGNOSIS — D693 Immune thrombocytopenic purpura: Secondary | ICD-10-CM | POA: Insufficient documentation

## 2021-04-09 LAB — CBC AND DIFFERENTIAL
HCT: 42 (ref 36–46)
Hemoglobin: 13.8 (ref 12.0–16.0)
Neutrophils Absolute: 1.23
Platelets: 32 — AB (ref 150–399)
WBC: 2.2

## 2021-04-09 LAB — CBC: RBC: 4.46 (ref 3.87–5.11)

## 2021-04-10 ENCOUNTER — Other Ambulatory Visit: Payer: Self-pay | Admitting: Pharmacist

## 2021-04-10 ENCOUNTER — Other Ambulatory Visit (HOSPITAL_COMMUNITY): Payer: Self-pay

## 2021-04-10 ENCOUNTER — Other Ambulatory Visit: Payer: Self-pay | Admitting: Internal Medicine

## 2021-04-10 DIAGNOSIS — M79671 Pain in right foot: Secondary | ICD-10-CM

## 2021-04-10 DIAGNOSIS — M21621 Bunionette of right foot: Secondary | ICD-10-CM

## 2021-04-10 DIAGNOSIS — Z9889 Other specified postprocedural states: Secondary | ICD-10-CM

## 2021-04-11 ENCOUNTER — Inpatient Hospital Stay: Payer: Medicaid Other

## 2021-04-11 ENCOUNTER — Other Ambulatory Visit: Payer: Self-pay

## 2021-04-11 ENCOUNTER — Other Ambulatory Visit (HOSPITAL_COMMUNITY): Payer: Self-pay

## 2021-04-11 VITALS — BP 112/77 | HR 106 | Temp 98.7°F | Resp 18 | Ht 60.0 in | Wt 140.8 lb

## 2021-04-11 DIAGNOSIS — D696 Thrombocytopenia, unspecified: Secondary | ICD-10-CM

## 2021-04-11 DIAGNOSIS — D693 Immune thrombocytopenic purpura: Secondary | ICD-10-CM | POA: Diagnosis present

## 2021-04-11 MED ORDER — ROMIPLOSTIM 250 MCG ~~LOC~~ SOLR
3.9500 ug/kg | Freq: Once | SUBCUTANEOUS | Status: AC
  Administered 2021-04-11: 250 ug via SUBCUTANEOUS
  Filled 2021-04-11: qty 0.5

## 2021-04-11 NOTE — Patient Instructions (Signed)
Romiplostim injection What is this medication? ROMIPLOSTIM (roe mi PLOE stim) helps your body make more platelets. This medicine is used to treat low platelets caused by chronic idiopathic thrombocytopenic purpura (ITP) or a bone marrow syndrome caused by radiation sickness. This medicine may be used for other purposes; ask your health care provider or pharmacist if you have questions. COMMON BRAND NAME(S): Nplate What should I tell my care team before I take this medication? They need to know if you have any of these conditions: blood clots myelodysplastic syndrome an unusual or allergic reaction to romiplostim, mannitol, other medicines, foods, dyes, or preservatives pregnant or trying to get pregnant breast-feeding How should I use this medication? This medicine is injected under the skin. It is given by a health care provider in a hospital or clinic setting. A special MedGuide will be given to you before each treatment. Be sure to read this information carefully each time. Talk to your health care provider about the use of this medicine in children. While it may be prescribed for children as young as newborns for selected conditions, precautions do apply. Overdosage: If you think you have taken too much of this medicine contact a poison control center or emergency room at once. NOTE: This medicine is only for you. Do not share this medicine with others. What if I miss a dose? Keep appointments for follow-up doses. It is important not to miss your dose. Call your health care provider if you are unable to keep an appointment. What may interact with this medication? Interactions are not expected. This list may not describe all possible interactions. Give your health care provider a list of all the medicines, herbs, non-prescription drugs, or dietary supplements you use. Also tell them if you smoke, drink alcohol, or use illegal drugs. Some items may interact with your medicine. What should I  watch for while using this medication? Visit your health care provider for regular checks on your progress. You may need blood work done while you are taking this medicine. Your condition will be monitored carefully while you are receiving this medicine. It is important not to miss any appointments. What side effects may I notice from receiving this medication? Side effects that you should report to your doctor or health care professional as soon as possible: allergic reactions (skin rash, itching or hives; swelling of the face, lips, or tongue) bleeding (bloody or black, tarry stools; red or dark brown urine; spitting up blood or brown material that looks like coffee grounds; red spots on the skin; unusual bruising or bleeding from the eyes, gums, or nose) blood clot (chest pain; shortness of breath; pain, swelling, or warmth in the leg) stroke (changes in vision; confusion; trouble speaking or understanding; severe headaches; sudden numbness or weakness of the face, arm or leg; trouble walking; dizziness; loss of balance or coordination) Side effects that usually do not require medical attention (report to your doctor or health care professional if they continue or are bothersome): diarrhea dizziness headache joint pain muscle pain stomach pain trouble sleeping This list may not describe all possible side effects. Call your doctor for medical advice about side effects. You may report side effects to FDA at 1-800-FDA-1088. Where should I keep my medication? This medicine is given in a hospital or clinic. It will not be stored at home. NOTE: This sheet is a summary. It may not cover all possible information. If you have questions about this medicine, talk to your doctor, pharmacist, or health care provider.    2022 Elsevier/Gold Standard (2019-10-11 10:28:13)  

## 2021-04-12 ENCOUNTER — Telehealth (HOSPITAL_COMMUNITY): Payer: Medicaid Other | Admitting: Psychiatry

## 2021-04-12 ENCOUNTER — Other Ambulatory Visit (HOSPITAL_COMMUNITY): Payer: Self-pay

## 2021-04-13 ENCOUNTER — Other Ambulatory Visit (HOSPITAL_COMMUNITY): Payer: Self-pay

## 2021-04-16 ENCOUNTER — Other Ambulatory Visit (HOSPITAL_COMMUNITY): Payer: Self-pay

## 2021-04-16 ENCOUNTER — Inpatient Hospital Stay: Payer: Medicaid Other

## 2021-04-16 ENCOUNTER — Other Ambulatory Visit: Payer: Self-pay | Admitting: Internal Medicine

## 2021-04-16 ENCOUNTER — Other Ambulatory Visit: Payer: Self-pay | Admitting: Hematology and Oncology

## 2021-04-16 ENCOUNTER — Ambulatory Visit (HOSPITAL_COMMUNITY)
Admission: RE | Admit: 2021-04-16 | Discharge: 2021-04-16 | Disposition: A | Discharge status: Home / Self Care | Payer: Medicaid Other | Attending: Psychiatry | Admitting: Psychiatry

## 2021-04-16 DIAGNOSIS — M21621 Bunionette of right foot: Secondary | ICD-10-CM

## 2021-04-16 DIAGNOSIS — M79671 Pain in right foot: Secondary | ICD-10-CM

## 2021-04-16 DIAGNOSIS — D696 Thrombocytopenia, unspecified: Secondary | ICD-10-CM

## 2021-04-16 DIAGNOSIS — Z9889 Other specified postprocedural states: Secondary | ICD-10-CM

## 2021-04-16 LAB — CBC AND DIFFERENTIAL
HCT: 38 (ref 36–46)
Hemoglobin: 13 (ref 12.0–16.0)
Neutrophils Absolute: 1.74
Platelets: 84 — AB (ref 150–399)
WBC: 3.1

## 2021-04-16 LAB — CBC: RBC: 4.22 (ref 3.87–5.11)

## 2021-04-16 MED ORDER — GABAPENTIN 300 MG PO CAPS
ORAL_CAPSULE | ORAL | 0 refills | Status: DC
  Filled 2021-04-16: qty 90, 15d supply, fill #0

## 2021-04-16 NOTE — H&P (Signed)
Behavioral Health Medical Screening Exam  Briana Sweeney is a 50 y.o. female seen by this provider with TTS counselor face to face, presents to [facility] as voluntary walk-in accompanied by no one. Past history of pancreatitis, liver disease, low platelets, seizure disorder.  Reports that her psychiatrist, Dr. Conrad [place] retired and she has ran out of her Prozac, Klonopin, and Abilify for one week. She is feeling like she is "going out of her head". No obvious tremors noted.  Has history of seizures, on seizure medications.  Reports recent hospital admission for low platelets and sees cancer center weekly for platelet infusion. She reports that Dr. Doreene Burke prescribed gabapentin today, she has not picked that up yet. Has appointment with [facility] on May 03, 2021 and Dr. Carolanne Grumbling on Aug 22 for medication management. Denies suicidal ideation, denies homicidal ideation, denies auditory and visual hallucinations. Able to contract for safety.  Reports difficulty with sleep. Lives alone. Last alcohol use was 2 months ago. Denies substance use.    Total Time spent with patient: 30 minutes  Psychiatric Specialty Exam:  Presentation  General Appearance:  Appropriate for Environment Eye Contact: Good Speech: Clear and Coherent; Normal Rate Speech Volume: Normal Handedness: No data recorded  Mood and Affect  Mood: Depressed Affect: Congruent  Thought Process  Thought Processes: Coherent; Goal Directed Descriptions of Associations:Intact Orientation:Full (Time, Place and Person) Thought Content:Logical History of Schizophrenia/Schizoaffective disorder:No data recorded Duration of Psychotic Symptoms:No data recorded Hallucinations:Hallucinations: None Ideas of Reference:None Suicidal Thoughts:Suicidal Thoughts: No Homicidal Thoughts:Homicidal Thoughts: No  Sensorium  Memory: Immediate Good; Recent Good; Remote Good Judgment: Good Insight: Good  Executive Functions   Concentration: Good Attention Span: Good Recall: Good Fund of Knowledge: Good Language: Good  Psychomotor Activity  Psychomotor Activity: Psychomotor Activity: Normal  Assets  Assets: Communication Skills; Desire for Improvement; Financial Resources/Insurance; Housing  Sleep  Sleep: Sleep: Poor   Physical Exam: Physical Exam Vitals and nursing note reviewed.  Constitutional:      General: She is not in acute distress. Cardiovascular:     Rate and Rhythm: Normal rate.     Heart sounds: Normal heart sounds.  Pulmonary:     Effort: Pulmonary effort is normal.     Breath sounds: Normal breath sounds.  Skin:    General: Skin is warm and dry.  Neurological:     Mental Status: She is alert and oriented to person, place, and time.  Psychiatric:        Attention and Perception: Attention normal.        Mood and Affect: Mood is depressed. Affect is flat.        Speech: Speech normal.        Behavior: Behavior is cooperative.        Thought Content: Thought content is not paranoid or delusional. Thought content does not include homicidal or suicidal ideation. Thought content does not include homicidal or suicidal plan.   Review of Systems  Constitutional:  Negative for chills and fever.  Respiratory:  Negative for shortness of breath.   Cardiovascular:  Negative for chest pain.  Gastrointestinal:  Negative for abdominal pain, nausea and vomiting.  Neurological:  Negative for headaches.  Psychiatric/Behavioral:  Positive for depression. Negative for hallucinations, substance abuse and suicidal ideas. The patient is nervous/anxious and has insomnia.   Blood pressure 108/63, temperature 98 F (36.7 C), temperature source Oral. There is no height or weight on file to calculate BMI.  Musculoskeletal: Strength & Muscle Tone: within normal limits Gait &  Station: normal Patient leans: N/A   Recommendations:  Based on my evaluation the patient does not appear to have an  emergency medical condition. Safe for outpatient treatment with resources. Information about [facility] provided per TTS counselor. Agrees with plan. Understands this provider does not prescribe benzodiazepines at this facility. This is best to be followed by outpatient provider.  Encouraged patient to start gabapentin as prescribed earlier today and to contact [facility] ASAP. Agrees with plan.   Chalmers Guest, NP 04/16/2021, 5:32 PM

## 2021-04-16 NOTE — BH Assessment (Signed)
Comprehensive Clinical Assessment (CCA) Note  04/16/2021 Briana Sweeney [id number]  DISPOSITION: Victoriano Lain NP recommends patient be provided with resources and does not meet inpatient criteria.  Flowsheet Row Lab from 04/16/2021 in [place] at [facility] ED to Hosp-Admission (Discharged) from 04/03/2021 in [facility] ED from 09/27/2020 in [facility] No Risk No Risk No Risk      The patient demonstrates the following risk factors for suicide: Chronic risk factors for suicide include: N/A. Acute risk factors for suicide include: N/A. Protective factors for this patient include: positive social support. Considering these factors, the overall suicide risk at this point appears to be low. Patient is appropriate for outpatient follow up.    Patient is a 50 year old female that presents this date requesting resources for medication management for ongoing anxiety and depression. Patient denies any S/I, H/I or AVH. Patient reports her PCP has retired and was prescribing multiple medications for symptom management. Patient has a history significant for alcohol induced pancreatitis although states she has been maintaining her sobriety for two months. Patient denies any other SA use. Patient denies any previous inpatient admissions associated with mental health or attempts or gestures at self harm. Patient denies any current legal. Patient denies access to firearms or current legal issues.   Patient is alert and oriented x 4. Patient's mood is pleasant and affect congruent. Patient's memory is intact with thoughts organized. Patient does not appear to be responding to internal stimuli.      Chief Complaint: No chief complaint on file.  Visit Diagnosis: MDD recurrent without psychotic features, severe     CCA Screening, Triage and Referral (STR)  Patient Reported Information How did you hear about [place]?  Self  What Is the Reason for Your Visit/Call Today? Pt is requesting referrals for medication management  How Long Has This Been Causing You Problems? <Week  What Do You Feel Would Help You the Most Today? Medication(s)   Have You Recently Had Any Thoughts About Hurting Yourself? No  Are You Planning to Commit Suicide/Harm Yourself At This time? No   Have you Recently Had Thoughts About [place]? No  Are You Planning to Harm Someone at This Time? No  Explanation: No data recorded  Have You Used Any Alcohol or Drugs in the Past 24 Hours? No  How Long Ago Did You Use Drugs or Alcohol? No data recorded What Did You Use and How Much? No data recorded  Do You Currently Have a Therapist/Psychiatrist? No  Name of Therapist/Psychiatrist: No data recorded  Have You Been Recently Discharged From Any Office Practice or Programs? No  Explanation of Discharge From Practice/Program: No data recorded    CCA Screening Triage Referral Assessment Type of Contact: Face-to-Face  Telemedicine Service Delivery:   Is this Initial or Reassessment? No data recorded Date Telepsych consult ordered in CH[facility]:  No data recorded Time Telepsych consult ordered in CHL:  No data recorded Location of Assessment: [facility]  Provider Location: [facility]   Collateral Involvement: NA   Does Patient Have a [place]? No data recorded Name and Contact of Legal Guardian: No data recorded If Minor and Not Living with Parent(s), Who has Custody? NA  Is CPS involved or ever been involved? Never  Is APS involved or ever been involved? Never   Patient Determined To Be At Risk for Harm To Self  or Others Based on Review of Patient Reported Information or Presenting Complaint? No  Method: No data recorded Availability of Means: No data recorded Intent: No data recorded Notification Required: No data recorded Additional Information for  Danger to Others Potential: No data recorded Additional Comments for Danger to Others Potential: No data recorded Are There Guns or Other Weapons in Your Home? No data recorded Types of Guns/Weapons: No data recorded Are These Weapons Safely Secured?                            No data recorded Who Could Verify You Are Able To Have These Secured: No data recorded Do You Have any Outstanding Charges, Pending Court Dates, Parole/Probation? No data recorded Contacted To Inform of Risk of Harm To Self or Others: Other: Comment (NA)    Does Patient Present under Involuntary Commitment? No  IVC Papers Initial File Date: No data recorded  [place] of Residence: [place]   Patient Currently Receiving the Following Services: Not Receiving Services   Determination of Need: Routine (7 days)   Options For Referral: Medication Management     CCA Biopsychosocial Patient Reported Schizophrenia/Schizoaffective Diagnosis in Past: No data recorded  Strengths: No data recorded  Mental Health Symptoms Depression:  No data recorded  Duration of Depressive symptoms:    Mania:  No data recorded  Anxiety:   No data recorded  Psychosis:  No data recorded  Duration of Psychotic symptoms:    Trauma:  No data recorded  Obsessions:  No data recorded  Compulsions:  No data recorded  Inattention:  No data recorded  Hyperactivity/Impulsivity:  No data recorded  Oppositional/Defiant Behaviors:  No data recorded  Emotional Irregularity:  No data recorded  Other Mood/Personality Symptoms:  No data recorded   Mental Status Exam Appearance and self-care  Stature:  No data recorded  Weight:  No data recorded  Clothing:  No data recorded  Grooming:  No data recorded  Cosmetic use:  No data recorded  Posture/gait:  No data recorded  Motor activity:  No data recorded  Sensorium  Attention:  No data recorded  Concentration:  No data recorded  Orientation:  No data recorded  Recall/memory:  No data  recorded  Affect and Mood  Affect:  No data recorded  Mood:  No data recorded  Relating  Eye contact:  No data recorded  Facial expression:  No data recorded  Attitude toward examiner:  No data recorded  Thought and Language  Speech flow: No data recorded  Thought content:  No data recorded  Preoccupation:  No data recorded  Hallucinations:  No data recorded  Organization:  No data recorded  [employer] of Knowledge:  No data recorded  Intelligence:  No data recorded  Abstraction:  No data recorded  Judgement:  No data recorded  Reality Testing:  No data recorded  Insight:  No data recorded  Decision Making:  No data recorded  Social Functioning  Social Maturity:  No data recorded  Social Judgement:  No data recorded  Stress  Stressors:  No data recorded  Coping Ability:  No data recorded  Skill Deficits:  No data recorded  Supports:  No data recorded    Religion:    Leisure/Recreation:    Exercise/Diet:     CCA Employment/Education Employment/Work Situation:    Education:     CCA Family/Childhood History Family and Relationship History:    Childhood History:  Child/Adolescent Assessment:     CCA Substance Use Alcohol/Drug Use:                           ASAM's:  Six Dimensions of Multidimensional Assessment  Dimension 1:  Acute Intoxication and/or Withdrawal Potential:      Dimension 2:  Biomedical Conditions and Complications:      Dimension 3:  Emotional, Behavioral, or Cognitive Conditions and Complications:     Dimension 4:  Readiness to Change:     Dimension 5:  Relapse, Continued use, or Continued Problem Potential:     Dimension 6:  Recovery/Living Environment:     ASAM Severity Score:    ASAM Recommended Level of Treatment:     Substance use Disorder (SUD)    Recommendations for Services/Supports/Treatments:    Discharge Disposition:    DSM5 Diagnoses: Patient Active Problem List   Diagnosis  Date Noted   Alcohol-induced acute pancreatitis    Hyperbilirubinemia    Acute low back pain with bilateral sciatica    AKI (acute kidney injury) ([place]) 04/04/2021   Left flank pain 04/04/2021   Acute on chronic pancreatitis ([place]) 04/04/2021   Transaminitis 04/04/2021   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HCC) 12/18/2020   Episodic mood disorder ([place]) 08/08/2020   Left leg pain 03/28/2020   History of recent fall 03/28/2020   Abnormal urine 12/13/2019   History of alcohol abuse 09/07/2019   Insomnia 05/05/2019   Dizziness 04/02/2019   Bruising 04/02/2019   Hyponatremia 04/02/2019   Head injury    Scalp laceration, initial encounter    Fall 01/20/2019   Epistaxis 01/20/2019   Altered mental status    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[place]) 12/05/2018   Other mixed anxiety disorders 10/27/2018   Neuropathy 10/27/2018   Abscess of bursa of left elbow 09/23/2018   Olecranon bursitis of left elbow    Erysipelas [id number]   Acute metabolic encephalopathy 0000000   Overdose 08/22/2018   Hypotension 08/22/2018   Seizure ([place]) 08/22/2018   Substance induced mood disorder ([place]) 07/17/2018   Cocaine dependence without complication ([place]) [id number]   Polysubstance abuse ([place]) 04/08/2018   Encephalopathy, 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[place]) 04/08/2018   Hepatitis C 03/18/2018   Portal hypertension ([place]) [id number]   Alcoholic intoxication without complication ([place]) [id number]   Depression 03/18/2018   Upper GI bleed 02/10/2018   Alcohol use disorder, severe, in early remission ([place]) 02/10/2018   Chronic anemia    Thrombocytopenia due to drugs    Suicidal ideation    Thrombocytopenia ([place]) 01/02/2018   GERD (gastroesophageal reflux disease) 11/01/2017   Trichimoniasis 10/30/2017   Alcohol dependence ([place]) 10/29/2017   Acute hyperactive alcohol withdrawal delirium ([place]) 10/09/2017   Alcohol abuse with alcohol-induced mood disorder (HCC)    Alcohol withdrawal syndrome with complication ([place])  [id number]   Esophageal varices without bleeding ([place])    Hematemesis 09/01/2017   Ascites due to alcoholic cirrhosis (HCC)    Decompensated hepatic cirrhosis ([place]) 07/23/2017   Alcohol abuse 07/23/2017   Hepatitis C antibody positive in blood 07/23/2017   Hypokalemia 07/23/2017   Jaundice 07/23/2017   Coagulopathy ([place]) 07/23/2017   Hypomagnesemia 07/23/2017   Pancytopenia ([place]) [id number]   Alcoholic cirrhosis of liver with ascites ([place]) 07/23/2017   Bacterial vaginosis 06/03/2017   UTI (urinary tract infection) 06/02/2017     Referrals to Alternative Service(s): Referred to Alternative Service(s):   Place:   Date:   Time:    Referred to Alternative Service(s):  Place:   Date:   Time:    Referred to Alternative Service(s):   Place:   Date:   Time:    Referred to Alternative Service(s):   Place:   Date:   Time:     Mamie Nick, LCAS

## 2021-04-16 NOTE — Progress Notes (Signed)
T

## 2021-04-17 ENCOUNTER — Encounter: Payer: Self-pay | Admitting: Oncology

## 2021-04-17 ENCOUNTER — Other Ambulatory Visit (HOSPITAL_COMMUNITY): Payer: Self-pay

## 2021-04-18 ENCOUNTER — Other Ambulatory Visit: Payer: Self-pay | Admitting: Pharmacist

## 2021-04-18 ENCOUNTER — Telehealth: Payer: Self-pay | Admitting: Oncology

## 2021-04-18 ENCOUNTER — Other Ambulatory Visit (HOSPITAL_COMMUNITY): Payer: Self-pay

## 2021-04-18 ENCOUNTER — Encounter: Payer: Self-pay | Admitting: Oncology

## 2021-04-18 ENCOUNTER — Inpatient Hospital Stay: Payer: Medicaid Other

## 2021-04-18 NOTE — Telephone Encounter (Signed)
Patient rescheduled today's Injection to tomorrow at 1:00 pm

## 2021-04-18 NOTE — Progress Notes (Signed)
Sent in request for DOS 08/11 to [employer].

## 2021-04-19 ENCOUNTER — Other Ambulatory Visit: Payer: Self-pay

## 2021-04-19 ENCOUNTER — Other Ambulatory Visit (HOSPITAL_COMMUNITY): Payer: Self-pay

## 2021-04-19 ENCOUNTER — Inpatient Hospital Stay: Payer: Medicaid Other

## 2021-04-19 VITALS — BP 100/67 | HR 78 | Temp 98.3°F | Resp 18 | Ht 60.0 in | Wt 143.2 lb

## 2021-04-19 DIAGNOSIS — D693 Immune thrombocytopenic purpura: Secondary | ICD-10-CM | POA: Diagnosis not present

## 2021-04-19 DIAGNOSIS — D696 Thrombocytopenia, unspecified: Secondary | ICD-10-CM

## 2021-04-19 MED ORDER — ROMIPLOSTIM 250 MCG ~~LOC~~ SOLR
3.9500 ug/kg | Freq: Once | SUBCUTANEOUS | Status: AC
  Administered 2021-04-19: 250 ug via SUBCUTANEOUS
  Filled 2021-04-19: qty 0.5

## 2021-04-19 NOTE — Patient Instructions (Signed)
Romiplostim injection What is this medication? ROMIPLOSTIM (roe mi PLOE stim) helps your body make more platelets. This medicine is used to treat low platelets caused by chronic idiopathic thrombocytopenic purpura (ITP) or a bone marrow syndrome caused by radiation sickness. This medicine may be used for other purposes; ask your health care provider or pharmacist if you have questions. COMMON BRAND NAME(S): Nplate What should I tell my care team before I take this medication? They need to know if you have any of these conditions: blood clots myelodysplastic syndrome an unusual or allergic reaction to romiplostim, mannitol, other medicines, foods, dyes, or preservatives pregnant or trying to get pregnant breast-feeding How should I use this medication? This medicine is injected under the skin. It is given by a health care provider in a hospital or clinic setting. A special MedGuide will be given to you before each treatment. Be sure to read this information carefully each time. Talk to your health care provider about the use of this medicine in children. While it may be prescribed for children as young as newborns for selected conditions, precautions do apply. Overdosage: If you think you have taken too much of this medicine contact a poison control center or emergency room at once. NOTE: This medicine is only for you. Do not share this medicine with others. What if I miss a dose? Keep appointments for follow-up doses. It is important not to miss your dose. Call your health care provider if you are unable to keep an appointment. What may interact with this medication? Interactions are not expected. This list may not describe all possible interactions. Give your health care provider a list of all the medicines, herbs, non-prescription drugs, or dietary supplements you use. Also tell them if you smoke, drink alcohol, or use illegal drugs. Some items may interact with your medicine. What should I  watch for while using this medication? Visit your health care provider for regular checks on your progress. You may need blood work done while you are taking this medicine. Your condition will be monitored carefully while you are receiving this medicine. It is important not to miss any appointments. What side effects may I notice from receiving this medication? Side effects that you should report to your doctor or health care professional as soon as possible: allergic reactions (skin rash, itching or hives; swelling of the face, lips, or tongue) bleeding (bloody or black, tarry stools; red or dark brown urine; spitting up blood or brown material that looks like coffee grounds; red spots on the skin; unusual bruising or bleeding from the eyes, gums, or nose) blood clot (chest pain; shortness of breath; pain, swelling, or warmth in the leg) stroke (changes in vision; confusion; trouble speaking or understanding; severe headaches; sudden numbness or weakness of the face, arm or leg; trouble walking; dizziness; loss of balance or coordination) Side effects that usually do not require medical attention (report to your doctor or health care professional if they continue or are bothersome): diarrhea dizziness headache joint pain muscle pain stomach pain trouble sleeping This list may not describe all possible side effects. Call your doctor for medical advice about side effects. You may report side effects to FDA at 1-800-FDA-1088. Where should I keep my medication? This medicine is given in a hospital or clinic. It will not be stored at home. NOTE: This sheet is a summary. It may not cover all possible information. If you have questions about this medicine, talk to your doctor, pharmacist, or health care provider.    2022 Elsevier/Gold Standard (2019-10-11 10:28:13)  

## 2021-04-20 ENCOUNTER — Other Ambulatory Visit (HOSPITAL_COMMUNITY): Payer: Self-pay

## 2021-04-23 ENCOUNTER — Encounter: Payer: Self-pay | Admitting: Hematology and Oncology

## 2021-04-23 ENCOUNTER — Inpatient Hospital Stay: Payer: Medicaid Other

## 2021-04-23 DIAGNOSIS — D696 Thrombocytopenia, unspecified: Secondary | ICD-10-CM

## 2021-04-23 LAB — CBC AND DIFFERENTIAL
HCT: 41 (ref 36–46)
Hemoglobin: 14 (ref 12.0–16.0)
Neutrophils Absolute: 2.17
Platelets: 123 — AB (ref 150–399)
WBC: 3.5

## 2021-04-23 LAB — CBC: RBC: 4.45 (ref 3.87–5.11)

## 2021-04-25 ENCOUNTER — Inpatient Hospital Stay: Payer: Medicaid Other

## 2021-04-25 ENCOUNTER — Other Ambulatory Visit: Payer: Self-pay

## 2021-04-25 VITALS — BP 135/90 | HR 111 | Temp 98.0°F | Resp 18 | Ht 60.0 in | Wt 145.8 lb

## 2021-04-25 DIAGNOSIS — D696 Thrombocytopenia, unspecified: Secondary | ICD-10-CM

## 2021-04-25 DIAGNOSIS — D693 Immune thrombocytopenic purpura: Secondary | ICD-10-CM | POA: Diagnosis not present

## 2021-04-25 MED ORDER — ROMIPLOSTIM 250 MCG ~~LOC~~ SOLR
3.8000 ug/kg | Freq: Once | SUBCUTANEOUS | Status: AC
  Administered 2021-04-25: 250 ug via SUBCUTANEOUS
  Filled 2021-04-25: qty 0.5

## 2021-04-25 NOTE — Patient Instructions (Signed)
Romiplostim injection What is this medication? ROMIPLOSTIM (roe mi PLOE stim) helps your body make more platelets. This medicine is used to treat low platelets caused by chronic idiopathic thrombocytopenic purpura (ITP) or a bone marrow syndrome caused by radiation sickness. This medicine may be used for other purposes; ask your health care provider or pharmacist if you have questions. COMMON BRAND NAME(S): Nplate What should I tell my care team before I take this medication? They need to know if you have any of these conditions: blood clots myelodysplastic syndrome an unusual or allergic reaction to romiplostim, mannitol, other medicines, foods, dyes, or preservatives pregnant or trying to get pregnant breast-feeding How should I use this medication? This medicine is injected under the skin. It is given by a health care provider in a hospital or clinic setting. A special MedGuide will be given to you before each treatment. Be sure to read this information carefully each time. Talk to your health care provider about the use of this medicine in children. While it may be prescribed for children as young as newborns for selected conditions, precautions do apply. Overdosage: If you think you have taken too much of this medicine contact a poison control center or emergency room at once. NOTE: This medicine is only for you. Do not share this medicine with others. What if I miss a dose? Keep appointments for follow-up doses. It is important not to miss your dose. Call your health care provider if you are unable to keep an appointment. What may interact with this medication? Interactions are not expected. This list may not describe all possible interactions. Give your health care provider a list of all the medicines, herbs, non-prescription drugs, or dietary supplements you use. Also tell them if you smoke, drink alcohol, or use illegal drugs. Some items may interact with your medicine. What should I  watch for while using this medication? Visit your health care provider for regular checks on your progress. You may need blood work done while you are taking this medicine. Your condition will be monitored carefully while you are receiving this medicine. It is important not to miss any appointments. What side effects may I notice from receiving this medication? Side effects that you should report to your doctor or health care professional as soon as possible: allergic reactions (skin rash, itching or hives; swelling of the face, lips, or tongue) bleeding (bloody or black, tarry stools; red or dark brown urine; spitting up blood or brown material that looks like coffee grounds; red spots on the skin; unusual bruising or bleeding from the eyes, gums, or nose) blood clot (chest pain; shortness of breath; pain, swelling, or warmth in the leg) stroke (changes in vision; confusion; trouble speaking or understanding; severe headaches; sudden numbness or weakness of the face, arm or leg; trouble walking; dizziness; loss of balance or coordination) Side effects that usually do not require medical attention (report to your doctor or health care professional if they continue or are bothersome): diarrhea dizziness headache joint pain muscle pain stomach pain trouble sleeping This list may not describe all possible side effects. Call your doctor for medical advice about side effects. You may report side effects to FDA at 1-800-FDA-1088. Where should I keep my medication? This medicine is given in a hospital or clinic. It will not be stored at home. NOTE: This sheet is a summary. It may not cover all possible information. If you have questions about this medicine, talk to your doctor, pharmacist, or health care provider.    2022 Elsevier/Gold Standard (2019-10-11 10:28:13)  

## 2021-04-30 ENCOUNTER — Inpatient Hospital Stay: Payer: Medicaid Other

## 2021-04-30 ENCOUNTER — Encounter: Payer: Self-pay | Admitting: Hematology and Oncology

## 2021-04-30 ENCOUNTER — Other Ambulatory Visit: Payer: Self-pay

## 2021-04-30 DIAGNOSIS — D696 Thrombocytopenia, unspecified: Secondary | ICD-10-CM

## 2021-04-30 LAB — CBC AND DIFFERENTIAL
HCT: 40 (ref 36–46)
Hemoglobin: 13.4 (ref 12.0–16.0)
Neutrophils Absolute: 1.65
Platelets: 103 — AB (ref 150–399)
WBC: 2.7

## 2021-04-30 LAB — CBC: RBC: 4.37 (ref 3.87–5.11)

## 2021-05-02 ENCOUNTER — Inpatient Hospital Stay: Payer: Medicaid Other

## 2021-05-02 ENCOUNTER — Other Ambulatory Visit: Payer: Self-pay

## 2021-05-02 VITALS — BP 113/73 | HR 79 | Temp 97.7°F | Resp 18 | Ht 60.0 in | Wt 142.5 lb

## 2021-05-02 DIAGNOSIS — D693 Immune thrombocytopenic purpura: Secondary | ICD-10-CM | POA: Diagnosis not present

## 2021-05-02 DIAGNOSIS — D696 Thrombocytopenia, unspecified: Secondary | ICD-10-CM

## 2021-05-02 MED ORDER — ROMIPLOSTIM 250 MCG ~~LOC~~ SOLR
3.8000 ug/kg | Freq: Once | SUBCUTANEOUS | Status: AC
  Administered 2021-05-02: 250 ug via SUBCUTANEOUS
  Filled 2021-05-02: qty 0.5

## 2021-05-02 NOTE — Progress Notes (Signed)
Called and made reservations for transportation through [place] for DOS: 08/29, 08/31, 09/06, 09/07, 09/12, 09/14, 09/19, 09/21.

## 2021-05-02 NOTE — Patient Instructions (Signed)
Romiplostim injection What is this medication? ROMIPLOSTIM (roe mi PLOE stim) helps your body make more platelets. This medicine is used to treat low platelets caused by chronic idiopathic thrombocytopenic purpura (ITP) or a bone marrow syndrome caused by radiation sickness. This medicine may be used for other purposes; ask your health care provider or pharmacist if you have questions. COMMON BRAND NAME(S): Nplate What should I tell my care team before I take this medication? They need to know if you have any of these conditions: blood clots myelodysplastic syndrome an unusual or allergic reaction to romiplostim, mannitol, other medicines, foods, dyes, or preservatives pregnant or trying to get pregnant breast-feeding How should I use this medication? This medicine is injected under the skin. It is given by a health care provider in a hospital or clinic setting. A special MedGuide will be given to you before each treatment. Be sure to read this information carefully each time. Talk to your health care provider about the use of this medicine in children. While it may be prescribed for children as young as newborns for selected conditions, precautions do apply. Overdosage: If you think you have taken too much of this medicine contact a poison control center or emergency room at once. NOTE: This medicine is only for you. Do not share this medicine with others. What if I miss a dose? Keep appointments for follow-up doses. It is important not to miss your dose. Call your health care provider if you are unable to keep an appointment. What may interact with this medication? Interactions are not expected. This list may not describe all possible interactions. Give your health care provider a list of all the medicines, herbs, non-prescription drugs, or dietary supplements you use. Also tell them if you smoke, drink alcohol, or use illegal drugs. Some items may interact with your medicine. What should I  watch for while using this medication? Visit your health care provider for regular checks on your progress. You may need blood work done while you are taking this medicine. Your condition will be monitored carefully while you are receiving this medicine. It is important not to miss any appointments. What side effects may I notice from receiving this medication? Side effects that you should report to your doctor or health care professional as soon as possible: allergic reactions (skin rash, itching or hives; swelling of the face, lips, or tongue) bleeding (bloody or black, tarry stools; red or dark brown urine; spitting up blood or brown material that looks like coffee grounds; red spots on the skin; unusual bruising or bleeding from the eyes, gums, or nose) blood clot (chest pain; shortness of breath; pain, swelling, or warmth in the leg) stroke (changes in vision; confusion; trouble speaking or understanding; severe headaches; sudden numbness or weakness of the face, arm or leg; trouble walking; dizziness; loss of balance or coordination) Side effects that usually do not require medical attention (report to your doctor or health care professional if they continue or are bothersome): diarrhea dizziness headache joint pain muscle pain stomach pain trouble sleeping This list may not describe all possible side effects. Call your doctor for medical advice about side effects. You may report side effects to FDA at 1-800-FDA-1088. Where should I keep my medication? This medicine is given in a hospital or clinic. It will not be stored at home. NOTE: This sheet is a summary. It may not cover all possible information. If you have questions about this medicine, talk to your doctor, pharmacist, or health care provider.    2022 Elsevier/Gold Standard (2019-10-11 10:28:13)  

## 2021-05-04 ENCOUNTER — Other Ambulatory Visit (HOSPITAL_COMMUNITY): Payer: Self-pay

## 2021-05-04 ENCOUNTER — Encounter: Payer: Self-pay | Admitting: Oncology

## 2021-05-04 ENCOUNTER — Other Ambulatory Visit (HOSPITAL_BASED_OUTPATIENT_CLINIC_OR_DEPARTMENT_OTHER): Payer: Self-pay

## 2021-05-04 MED ORDER — CLONAZEPAM 0.5 MG PO TABS
ORAL_TABLET | ORAL | 0 refills | Status: DC
  Filled 2021-05-04: qty 60, 30d supply, fill #0

## 2021-05-07 ENCOUNTER — Encounter: Payer: Self-pay | Admitting: Hematology and Oncology

## 2021-05-07 ENCOUNTER — Inpatient Hospital Stay: Payer: Medicaid Other

## 2021-05-07 DIAGNOSIS — D696 Thrombocytopenia, unspecified: Secondary | ICD-10-CM

## 2021-05-07 LAB — CBC AND DIFFERENTIAL
HCT: 41 (ref 36–46)
Hemoglobin: 13.7 (ref 12.0–16.0)
Neutrophils Absolute: 1.33
Platelets: 69 — AB (ref 150–399)
WBC: 2.5

## 2021-05-07 LAB — CBC: RBC: 4.4 (ref 3.87–5.11)

## 2021-05-09 ENCOUNTER — Ambulatory Visit: Payer: Self-pay | Admitting: Nurse Practitioner

## 2021-05-09 ENCOUNTER — Other Ambulatory Visit: Payer: Self-pay

## 2021-05-09 ENCOUNTER — Inpatient Hospital Stay: Payer: Medicaid Other

## 2021-05-09 VITALS — BP 141/96 | HR 83 | Temp 97.8°F | Resp 18 | Ht 60.0 in | Wt 137.2 lb

## 2021-05-09 DIAGNOSIS — D693 Immune thrombocytopenic purpura: Secondary | ICD-10-CM | POA: Diagnosis not present

## 2021-05-09 DIAGNOSIS — D696 Thrombocytopenia, unspecified: Secondary | ICD-10-CM

## 2021-05-09 MED ORDER — ROMIPLOSTIM 250 MCG ~~LOC~~ SOLR
4.0000 ug/kg | Freq: Once | SUBCUTANEOUS | Status: AC
  Administered 2021-05-09: 250 ug via SUBCUTANEOUS
  Filled 2021-05-09: qty 0.5

## 2021-05-09 NOTE — Patient Instructions (Signed)
Romiplostim injection What is this medication? ROMIPLOSTIM (roe mi PLOE stim) helps your body make more platelets. This medicine is used to treat low platelets caused by chronic idiopathic thrombocytopenic purpura (ITP) or a bone marrow syndrome caused by radiation sickness. This medicine may be used for other purposes; ask your health care provider or pharmacist if you have questions. COMMON BRAND NAME(S): Nplate What should I tell my care team before I take this medication? They need to know if you have any of these conditions: blood clots myelodysplastic syndrome an unusual or allergic reaction to romiplostim, mannitol, other medicines, foods, dyes, or preservatives pregnant or trying to get pregnant breast-feeding How should I use this medication? This medicine is injected under the skin. It is given by a health care provider in a hospital or clinic setting. A special MedGuide will be given to you before each treatment. Be sure to read this information carefully each time. Talk to your health care provider about the use of this medicine in children. While it may be prescribed for children as young as newborns for selected conditions, precautions do apply. Overdosage: If you think you have taken too much of this medicine contact a poison control center or emergency room at once. NOTE: This medicine is only for you. Do not share this medicine with others. What if I miss a dose? Keep appointments for follow-up doses. It is important not to miss your dose. Call your health care provider if you are unable to keep an appointment. What may interact with this medication? Interactions are not expected. This list may not describe all possible interactions. Give your health care provider a list of all the medicines, herbs, non-prescription drugs, or dietary supplements you use. Also tell them if you smoke, drink alcohol, or use illegal drugs. Some items may interact with your medicine. What should I  watch for while using this medication? Visit your health care provider for regular checks on your progress. You may need blood work done while you are taking this medicine. Your condition will be monitored carefully while you are receiving this medicine. It is important not to miss any appointments. What side effects may I notice from receiving this medication? Side effects that you should report to your doctor or health care professional as soon as possible: allergic reactions (skin rash, itching or hives; swelling of the face, lips, or tongue) bleeding (bloody or black, tarry stools; red or dark brown urine; spitting up blood or brown material that looks like coffee grounds; red spots on the skin; unusual bruising or bleeding from the eyes, gums, or nose) blood clot (chest pain; shortness of breath; pain, swelling, or warmth in the leg) stroke (changes in vision; confusion; trouble speaking or understanding; severe headaches; sudden numbness or weakness of the face, arm or leg; trouble walking; dizziness; loss of balance or coordination) Side effects that usually do not require medical attention (report to your doctor or health care professional if they continue or are bothersome): diarrhea dizziness headache joint pain muscle pain stomach pain trouble sleeping This list may not describe all possible side effects. Call your doctor for medical advice about side effects. You may report side effects to FDA at 1-800-FDA-1088. Where should I keep my medication? This medicine is given in a hospital or clinic. It will not be stored at home. NOTE: This sheet is a summary. It may not cover all possible information. If you have questions about this medicine, talk to your doctor, pharmacist, or health care provider.    2022 Elsevier/Gold Standard (2019-10-11 10:28:13)  

## 2021-05-10 ENCOUNTER — Other Ambulatory Visit (HOSPITAL_COMMUNITY): Payer: Self-pay

## 2021-05-15 ENCOUNTER — Inpatient Hospital Stay: Payer: Medicaid Other | Attending: Hematology & Oncology

## 2021-05-15 ENCOUNTER — Other Ambulatory Visit: Payer: Self-pay | Admitting: Hematology and Oncology

## 2021-05-15 DIAGNOSIS — D693 Immune thrombocytopenic purpura: Secondary | ICD-10-CM | POA: Insufficient documentation

## 2021-05-15 DIAGNOSIS — Z23 Encounter for immunization: Secondary | ICD-10-CM | POA: Insufficient documentation

## 2021-05-15 DIAGNOSIS — D696 Thrombocytopenia, unspecified: Secondary | ICD-10-CM

## 2021-05-15 LAB — CBC AND DIFFERENTIAL
HCT: 42 (ref 36–46)
Hemoglobin: 14.1 (ref 12.0–16.0)
Neutrophils Absolute: 2.63
Platelets: 57 — AB (ref 150–399)
WBC: 3.5

## 2021-05-15 LAB — CBC
Absolute Lymphocytes: 0.39 — AB (ref 0.65–4.75)
MCV: 92 (ref 81–99)
RBC: 4.56 (ref 3.87–5.11)

## 2021-05-16 ENCOUNTER — Inpatient Hospital Stay: Payer: Medicaid Other

## 2021-05-16 ENCOUNTER — Encounter: Payer: Self-pay | Admitting: Plastic Surgery

## 2021-05-16 ENCOUNTER — Other Ambulatory Visit: Payer: Self-pay

## 2021-05-16 ENCOUNTER — Ambulatory Visit (INDEPENDENT_AMBULATORY_CARE_PROVIDER_SITE_OTHER): Payer: Medicaid Other | Admitting: Plastic Surgery

## 2021-05-16 VITALS — BP 111/75 | HR 100 | Ht 60.0 in | Wt 136.4 lb

## 2021-05-16 DIAGNOSIS — Z411 Encounter for cosmetic surgery: Secondary | ICD-10-CM

## 2021-05-16 NOTE — Progress Notes (Signed)
Referring Provider Emelia Loron, NP [place],  [place] [address]   CC:  Chief Complaint  Patient presents with   Advice Only      Briana Sweeney is an 50 y.o. female.  HPI: Patient presents to discuss bilateral earlobe tears.  The been present for some time.  She is interested in getting them fixed.  Allergies  Allergen Reactions   Other Other (See Comments)    Platelets: Rx chest pain, tremors, body aches        Past Medical History:  Diagnosis Date   Alcohol use    Anxiety    Bipolar affective disorder ([place])    With anxiety features   Bunionette of right foot 11/2019   Cirrhosis of liver (HCC)    Due to alcohol and hepatitis C   Depression    Esophageal 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 ([place])    ETOHism (HCC)    GERD (gastroesophageal reflux disease)    Hematemesis 02/10/2018   Hepatitis C 2018   hepatitis c and alcohol related hepatitis   Heroin abuse ([place])    History of blood transfusion    "blood doesn't clot; I fell down and had to have a transfusion"   History of kidney stones    History of thrombocytopenia    Menopause 2016   Migraine    "when I get really stressed" (09/01/2017)   S/P foot surgery, right 11/2019   Schizophrenia ([place])    Seizures ([place])    "when I run out of my RX; lots recently" (09/01/2017)   Thrombocytopenia ([place])    Urinary urgency 05/2020    Past Surgical History:  Procedure Laterality Date   BUNIONECTOMY     ESOPHAGOGASTRODUODENOSCOPY N/A 09/03/2017   Procedure: ESOPHAGOGASTRODUODENOSCOPY (EGD);  Surgeon: Doran Stabler, MD;  Location: [facility];  Service: Gastroenterology;  Laterality: N/A;   FINGER FRACTURE SURGERY Left    "shattered my pinky"   FRACTURE SURGERY     I & D EXTREMITY Left 09/18/2018   Procedure: IRRIGATION AND DEBRIDEMENT EXTREMITY;  Surgeon: Leanora Cover, MD;  Location: [facility];  Service: Orthopedics;  Laterality: Left;   IR PARACENTESIS  07/23/2017   IR PARACENTESIS  07/2017   "did it  twice in the same week" (09/01/2017)   SHOULDER OPEN ROTATOR CUFF REPAIR Right    TOOTH EXTRACTION  08/2019   TUBAL LIGATION     VAGINAL HYSTERECTOMY      Family History  Problem Relation Age of Onset   Lung cancer Mother 57   Alcohol abuse Mother    Throat cancer Father 56    Social History   Social History Narrative   She moved with a boyfriend to [place] and was followed at [facility].  He died of a massive heart attack in 8/18, per her report, and so she moved back to [place] and is living with a friend.      Right handed   2 story home    Lives alone 11/09/19     Review of Systems General: Denies fevers, chills, weight loss CV: Denies chest pain, shortness of breath, palpitations  Physical Exam Vitals with BMI 05/16/2021 05/09/2021 05/02/2021  Height '5\' 0"'$  '5\' 0"'$  '5\' 0"'$   Weight 136 lbs 6 oz 137 lbs 4 oz 142 lbs 8 oz  BMI 26.64 0000000 [id number]  Systolic 99991111 [id number]  Diastolic 75 96 73  Pulse [id number] 83 79  Some encounter information is confidential and restricted. Go to Review Flowsheets  activity to see all data.    General:  No acute distress,  Alert and oriented, Non-Toxic, Normal speech and affect Both earlobes have evidence of earlobe tears x2.  On the left side 1 is fairly irregular.  She says these have never been repaired in the past.  Assessment/Plan I discussed the risks and benefits of earlobe repair.  I explained the details of the procedure.  I explained with her having to tears on each earlobe that are adjacent to each other it would be best to be done in a staged manner.  We discussed bleeding, infection, damage to surrounding structures as potential risks.  All of her questions were answered and we will plan to move forward with staged bilateral earlobe repairs.  Cindra Presume 05/16/2021, 10:47 AM

## 2021-05-21 ENCOUNTER — Other Ambulatory Visit: Payer: Self-pay | Admitting: Hematology and Oncology

## 2021-05-21 ENCOUNTER — Inpatient Hospital Stay: Payer: Medicaid Other

## 2021-05-21 DIAGNOSIS — D696 Thrombocytopenia, unspecified: Secondary | ICD-10-CM

## 2021-05-21 LAB — CBC
MCV: 92 (ref 81–99)
RBC: 4.68 (ref 3.87–5.11)

## 2021-05-21 LAB — CBC AND DIFFERENTIAL
HCT: 43 (ref 36–46)
Hemoglobin: 14.4 (ref 12.0–16.0)
Neutrophils Absolute: 1.58
Platelets: 116 — AB (ref 150–399)
WBC: 3.1

## 2021-05-22 ENCOUNTER — Encounter: Payer: Self-pay | Admitting: Oncology

## 2021-05-23 ENCOUNTER — Other Ambulatory Visit: Payer: Self-pay

## 2021-05-23 ENCOUNTER — Inpatient Hospital Stay: Payer: Medicaid Other

## 2021-05-23 VITALS — BP 109/80 | HR 86 | Temp 97.6°F | Resp 18 | Ht 60.0 in | Wt 136.2 lb

## 2021-05-23 DIAGNOSIS — D696 Thrombocytopenia, unspecified: Secondary | ICD-10-CM

## 2021-05-23 DIAGNOSIS — Z23 Encounter for immunization: Secondary | ICD-10-CM | POA: Diagnosis not present

## 2021-05-23 DIAGNOSIS — D693 Immune thrombocytopenic purpura: Secondary | ICD-10-CM | POA: Diagnosis present

## 2021-05-23 MED ORDER — ROMIPLOSTIM 250 MCG ~~LOC~~ SOLR
4.0000 ug/kg | Freq: Once | SUBCUTANEOUS | Status: AC
  Administered 2021-05-23: 250 ug via SUBCUTANEOUS
  Filled 2021-05-23: qty 0.5

## 2021-05-23 NOTE — Patient Instructions (Signed)
Romiplostim injection What is this medication? ROMIPLOSTIM (roe mi PLOE stim) helps your body make more platelets. This medicine is used to treat low platelets caused by chronic idiopathic thrombocytopenic purpura (ITP) or a bone marrow syndrome caused by radiation sickness. This medicine may be used for other purposes; ask your health care provider or pharmacist if you have questions. COMMON BRAND NAME(S): Nplate What should I tell my care team before I take this medication? They need to know if you have any of these conditions: blood clots myelodysplastic syndrome an unusual or allergic reaction to romiplostim, mannitol, other medicines, foods, dyes, or preservatives pregnant or trying to get pregnant breast-feeding How should I use this medication? This medicine is injected under the skin. It is given by a health care provider in a hospital or clinic setting. A special MedGuide will be given to you before each treatment. Be sure to read this information carefully each time. Talk to your health care provider about the use of this medicine in children. While it may be prescribed for children as young as newborns for selected conditions, precautions do apply. Overdosage: If you think you have taken too much of this medicine contact a poison control center or emergency room at once. NOTE: This medicine is only for you. Do not share this medicine with others. What if I miss a dose? Keep appointments for follow-up doses. It is important not to miss your dose. Call your health care provider if you are unable to keep an appointment. What may interact with this medication? Interactions are not expected. This list may not describe all possible interactions. Give your health care provider a list of all the medicines, herbs, non-prescription drugs, or dietary supplements you use. Also tell them if you smoke, drink alcohol, or use illegal drugs. Some items may interact with your medicine. What should I  watch for while using this medication? Visit your health care provider for regular checks on your progress. You may need blood work done while you are taking this medicine. Your condition will be monitored carefully while you are receiving this medicine. It is important not to miss any appointments. What side effects may I notice from receiving this medication? Side effects that you should report to your doctor or health care professional as soon as possible: allergic reactions (skin rash, itching or hives; swelling of the face, lips, or tongue) bleeding (bloody or black, tarry stools; red or dark brown urine; spitting up blood or brown material that looks like coffee grounds; red spots on the skin; unusual bruising or bleeding from the eyes, gums, or nose) blood clot (chest pain; shortness of breath; pain, swelling, or warmth in the leg) stroke (changes in vision; confusion; trouble speaking or understanding; severe headaches; sudden numbness or weakness of the face, arm or leg; trouble walking; dizziness; loss of balance or coordination) Side effects that usually do not require medical attention (report to your doctor or health care professional if they continue or are bothersome): diarrhea dizziness headache joint pain muscle pain stomach pain trouble sleeping This list may not describe all possible side effects. Call your doctor for medical advice about side effects. You may report side effects to FDA at 1-800-FDA-1088. Where should I keep my medication? This medicine is given in a hospital or clinic. It will not be stored at home. NOTE: This sheet is a summary. It may not cover all possible information. If you have questions about this medicine, talk to your doctor, pharmacist, or health care provider.    2022 Elsevier/Gold Standard (2019-10-11 10:28:13)  

## 2021-05-28 ENCOUNTER — Inpatient Hospital Stay: Payer: Medicaid Other

## 2021-05-28 ENCOUNTER — Other Ambulatory Visit: Payer: Self-pay | Admitting: Hematology and Oncology

## 2021-05-28 ENCOUNTER — Telehealth: Payer: Self-pay

## 2021-05-28 DIAGNOSIS — D696 Thrombocytopenia, unspecified: Secondary | ICD-10-CM

## 2021-05-28 LAB — CBC AND DIFFERENTIAL
HCT: 42 (ref 36–46)
Hemoglobin: 13.9 (ref 12.0–16.0)
Neutrophils Absolute: 1.02
Platelets: 13 — AB (ref 150–399)
WBC: 1.6

## 2021-05-28 LAB — CBC
MCV: 92 (ref 81–99)
RBC: 4.59 (ref 3.87–5.11)

## 2021-05-28 NOTE — Telephone Encounter (Signed)
Jamie from hematology called with critical platelet. Platelet: 13. Dr. [name] aware

## 2021-05-28 NOTE — Progress Notes (Signed)
Called and scheduled transportation with [employer] for DOS 09/26, 09/28, 10/03, 10/05, 10/10, and 10/12.

## 2021-05-30 ENCOUNTER — Inpatient Hospital Stay: Payer: Medicaid Other

## 2021-05-30 ENCOUNTER — Other Ambulatory Visit: Payer: Self-pay

## 2021-05-30 VITALS — BP 110/78 | HR 86 | Temp 98.1°F | Resp 18 | Ht 60.0 in | Wt 136.5 lb

## 2021-05-30 DIAGNOSIS — D693 Immune thrombocytopenic purpura: Secondary | ICD-10-CM | POA: Diagnosis not present

## 2021-05-30 DIAGNOSIS — D696 Thrombocytopenia, unspecified: Secondary | ICD-10-CM

## 2021-05-30 MED ORDER — ROMIPLOSTIM 250 MCG ~~LOC~~ SOLR
4.0500 ug/kg | Freq: Once | SUBCUTANEOUS | Status: AC
  Administered 2021-05-30: 250 ug via SUBCUTANEOUS
  Filled 2021-05-30: qty 0.5

## 2021-05-30 MED ORDER — INFLUENZA VAC SPLIT QUAD 0.5 ML IM SUSY
0.5000 mL | PREFILLED_SYRINGE | Freq: Once | INTRAMUSCULAR | Status: AC
  Administered 2021-05-30: 0.5 mL via INTRAMUSCULAR
  Filled 2021-05-30: qty 0.5

## 2021-05-30 NOTE — Patient Instructions (Signed)
Romiplostim injection What is this medication? ROMIPLOSTIM (roe mi PLOE stim) helps your body make more platelets. This medicine is used to treat low platelets caused by chronic idiopathic thrombocytopenic purpura (ITP) or a bone marrow syndrome caused by radiation sickness. This medicine may be used for other purposes; ask your health care provider or pharmacist if you have questions. COMMON BRAND NAME(S): Nplate What should I tell my care team before I take this medication? They need to know if you have any of these conditions: blood clots myelodysplastic syndrome an unusual or allergic reaction to romiplostim, mannitol, other medicines, foods, dyes, or preservatives pregnant or trying to get pregnant breast-feeding How should I use this medication? This medicine is injected under the skin. It is given by a health care provider in a hospital or clinic setting. A special MedGuide will be given to you before each treatment. Be sure to read this information carefully each time. Talk to your health care provider about the use of this medicine in children. While it may be prescribed for children as young as newborns for selected conditions, precautions do apply. Overdosage: If you think you have taken too much of this medicine contact a poison control center or emergency room at once. NOTE: This medicine is only for you. Do not share this medicine with others. What if I miss a dose? Keep appointments for follow-up doses. It is important not to miss your dose. Call your health care provider if you are unable to keep an appointment. What may interact with this medication? Interactions are not expected. This list may not describe all possible interactions. Give your health care provider a list of all the medicines, herbs, non-prescription drugs, or dietary supplements you use. Also tell them if you smoke, drink alcohol, or use illegal drugs. Some items may interact with your medicine. What should I  watch for while using this medication? Visit your health care provider for regular checks on your progress. You may need blood work done while you are taking this medicine. Your condition will be monitored carefully while you are receiving this medicine. It is important not to miss any appointments. What side effects may I notice from receiving this medication? Side effects that you should report to your doctor or health care professional as soon as possible: allergic reactions (skin rash, itching or hives; swelling of the face, lips, or tongue) bleeding (bloody or black, tarry stools; red or dark brown urine; spitting up blood or brown material that looks like coffee grounds; red spots on the skin; unusual bruising or bleeding from the eyes, gums, or nose) blood clot (chest pain; shortness of breath; pain, swelling, or warmth in the leg) stroke (changes in vision; confusion; trouble speaking or understanding; severe headaches; sudden numbness or weakness of the face, arm or leg; trouble walking; dizziness; loss of balance or coordination) Side effects that usually do not require medical attention (report to your doctor or health care professional if they continue or are bothersome): diarrhea dizziness headache joint pain muscle pain stomach pain trouble sleeping This list may not describe all possible side effects. Call your doctor for medical advice about side effects. You may report side effects to FDA at 1-800-FDA-1088. Where should I keep my medication? This medicine is given in a hospital or clinic. It will not be stored at home. NOTE: This sheet is a summary. It may not cover all possible information. If you have questions about this medicine, talk to your doctor, pharmacist, or health care provider.    2022 Elsevier/Gold Standard (2019-10-11 10:28:13)  

## 2021-05-31 ENCOUNTER — Other Ambulatory Visit: Payer: Self-pay | Admitting: Internal Medicine

## 2021-05-31 ENCOUNTER — Other Ambulatory Visit (HOSPITAL_COMMUNITY): Payer: Self-pay

## 2021-05-31 DIAGNOSIS — M79671 Pain in right foot: Secondary | ICD-10-CM

## 2021-05-31 DIAGNOSIS — M21621 Bunionette of right foot: Secondary | ICD-10-CM

## 2021-05-31 DIAGNOSIS — Z9889 Other specified postprocedural states: Secondary | ICD-10-CM

## 2021-05-31 NOTE — Telephone Encounter (Signed)
   Notes to clinic Not a pt in this practice.

## 2021-06-01 ENCOUNTER — Other Ambulatory Visit (HOSPITAL_COMMUNITY): Payer: Self-pay

## 2021-06-02 ENCOUNTER — Other Ambulatory Visit (HOSPITAL_COMMUNITY): Payer: Self-pay

## 2021-06-02 MED ORDER — CLONAZEPAM 0.5 MG PO TABS
0.5000 mg | ORAL_TABLET | Freq: Two times a day (BID) | ORAL | 2 refills | Status: DC
  Filled 2021-06-02: qty 60, 30d supply, fill #0
  Filled 2021-07-02: qty 60, 30d supply, fill #1
  Filled 2021-08-20: qty 60, 30d supply, fill #2

## 2021-06-02 MED ORDER — ARIPIPRAZOLE 10 MG PO TABS
10.0000 mg | ORAL_TABLET | Freq: Every day | ORAL | 2 refills | Status: DC
  Filled 2021-06-02: qty 30, 30d supply, fill #0

## 2021-06-02 MED ORDER — FLUOXETINE HCL 10 MG PO CAPS
10.0000 mg | ORAL_CAPSULE | Freq: Every day | ORAL | 2 refills | Status: DC
  Filled 2021-06-02: qty 30, 30d supply, fill #0

## 2021-06-04 ENCOUNTER — Inpatient Hospital Stay: Payer: Medicaid Other

## 2021-06-04 ENCOUNTER — Other Ambulatory Visit (HOSPITAL_COMMUNITY): Payer: Self-pay

## 2021-06-04 ENCOUNTER — Other Ambulatory Visit: Payer: Self-pay | Admitting: Hematology and Oncology

## 2021-06-04 DIAGNOSIS — D696 Thrombocytopenia, unspecified: Secondary | ICD-10-CM

## 2021-06-04 LAB — CBC AND DIFFERENTIAL
HCT: 43 (ref 36–46)
Hemoglobin: 14.2 (ref 12.0–16.0)
Neutrophils Absolute: 0.86
Platelets: 33 — AB (ref 150–399)
WBC: 1.9

## 2021-06-04 LAB — CBC: RBC: 4.65 (ref 3.87–5.11)

## 2021-06-05 ENCOUNTER — Other Ambulatory Visit (HOSPITAL_COMMUNITY): Payer: Self-pay

## 2021-06-06 ENCOUNTER — Other Ambulatory Visit: Payer: Self-pay

## 2021-06-06 ENCOUNTER — Inpatient Hospital Stay: Payer: Medicaid Other

## 2021-06-06 ENCOUNTER — Other Ambulatory Visit (HOSPITAL_COMMUNITY): Payer: Self-pay

## 2021-06-06 VITALS — BP 123/75 | HR 93 | Temp 97.7°F | Resp 16 | Ht 60.0 in | Wt 140.1 lb

## 2021-06-06 DIAGNOSIS — D696 Thrombocytopenia, unspecified: Secondary | ICD-10-CM

## 2021-06-06 DIAGNOSIS — D693 Immune thrombocytopenic purpura: Secondary | ICD-10-CM | POA: Diagnosis not present

## 2021-06-06 MED ORDER — ROMIPLOSTIM 250 MCG ~~LOC~~ SOLR
4.0000 ug/kg | Freq: Once | SUBCUTANEOUS | Status: AC
  Administered 2021-06-06: 250 ug via SUBCUTANEOUS
  Filled 2021-06-06: qty 0.5

## 2021-06-06 NOTE — Progress Notes (Signed)
Discharged home, stable  

## 2021-06-06 NOTE — Patient Instructions (Signed)
Romiplostim injection What is this medication? ROMIPLOSTIM (roe mi PLOE stim) helps your body make more platelets. This medicine is used to treat low platelets caused by chronic idiopathic thrombocytopenic purpura (ITP) or a bone marrow syndrome caused by radiation sickness. This medicine may be used for other purposes; ask your health care provider or pharmacist if you have questions. COMMON BRAND NAME(S): Nplate What should I tell my care team before I take this medication? They need to know if you have any of these conditions: blood clots myelodysplastic syndrome an unusual or allergic reaction to romiplostim, mannitol, other medicines, foods, dyes, or preservatives pregnant or trying to get pregnant breast-feeding How should I use this medication? This medicine is injected under the skin. It is given by a health care provider in a hospital or clinic setting. A special MedGuide will be given to you before each treatment. Be sure to read this information carefully each time. Talk to your health care provider about the use of this medicine in children. While it may be prescribed for children as young as newborns for selected conditions, precautions do apply. Overdosage: If you think you have taken too much of this medicine contact a poison control center or emergency room at once. NOTE: This medicine is only for you. Do not share this medicine with others. What if I miss a dose? Keep appointments for follow-up doses. It is important not to miss your dose. Call your health care provider if you are unable to keep an appointment. What may interact with this medication? Interactions are not expected. This list may not describe all possible interactions. Give your health care provider a list of all the medicines, herbs, non-prescription drugs, or dietary supplements you use. Also tell them if you smoke, drink alcohol, or use illegal drugs. Some items may interact with your medicine. What should I  watch for while using this medication? Visit your health care provider for regular checks on your progress. You may need blood work done while you are taking this medicine. Your condition will be monitored carefully while you are receiving this medicine. It is important not to miss any appointments. What side effects may I notice from receiving this medication? Side effects that you should report to your doctor or health care professional as soon as possible: allergic reactions (skin rash, itching or hives; swelling of the face, lips, or tongue) bleeding (bloody or black, tarry stools; red or dark brown urine; spitting up blood or brown material that looks like coffee grounds; red spots on the skin; unusual bruising or bleeding from the eyes, gums, or nose) blood clot (chest pain; shortness of breath; pain, swelling, or warmth in the leg) stroke (changes in vision; confusion; trouble speaking or understanding; severe headaches; sudden numbness or weakness of the face, arm or leg; trouble walking; dizziness; loss of balance or coordination) Side effects that usually do not require medical attention (report to your doctor or health care professional if they continue or are bothersome): diarrhea dizziness headache joint pain muscle pain stomach pain trouble sleeping This list may not describe all possible side effects. Call your doctor for medical advice about side effects. You may report side effects to FDA at 1-800-FDA-1088. Where should I keep my medication? This medicine is given in a hospital or clinic. It will not be stored at home. NOTE: This sheet is a summary. It may not cover all possible information. If you have questions about this medicine, talk to your doctor, pharmacist, or health care provider.    2022 Elsevier/Gold Standard (2019-10-11 10:28:13)  

## 2021-06-08 ENCOUNTER — Other Ambulatory Visit (HOSPITAL_COMMUNITY): Payer: Self-pay

## 2021-06-11 ENCOUNTER — Other Ambulatory Visit: Payer: Self-pay | Admitting: Hematology and Oncology

## 2021-06-11 ENCOUNTER — Other Ambulatory Visit (HOSPITAL_COMMUNITY): Payer: Self-pay

## 2021-06-11 ENCOUNTER — Inpatient Hospital Stay: Payer: Medicaid Other | Attending: Hematology & Oncology

## 2021-06-11 DIAGNOSIS — D696 Thrombocytopenia, unspecified: Secondary | ICD-10-CM

## 2021-06-11 DIAGNOSIS — K7030 Alcoholic cirrhosis of liver without ascites: Secondary | ICD-10-CM | POA: Insufficient documentation

## 2021-06-11 DIAGNOSIS — R161 Splenomegaly, not elsewhere classified: Secondary | ICD-10-CM | POA: Insufficient documentation

## 2021-06-11 DIAGNOSIS — D693 Immune thrombocytopenic purpura: Secondary | ICD-10-CM | POA: Insufficient documentation

## 2021-06-11 LAB — CBC
MCV: 93 (ref 81–99)
RBC: 3.93 (ref 3.87–5.11)

## 2021-06-11 LAB — CBC AND DIFFERENTIAL
HCT: 37 (ref 36–46)
Hemoglobin: 12.3 (ref 12.0–16.0)
Neutrophils Absolute: 1.27
Platelets: 36 — AB (ref 150–399)
WBC: 2.3
WBC: 2.3

## 2021-06-13 ENCOUNTER — Other Ambulatory Visit: Payer: Self-pay

## 2021-06-13 ENCOUNTER — Inpatient Hospital Stay: Payer: Medicaid Other

## 2021-06-13 VITALS — BP 114/74 | HR 95 | Temp 98.0°F | Resp 18 | Ht 60.0 in | Wt 141.0 lb

## 2021-06-13 DIAGNOSIS — D696 Thrombocytopenia, unspecified: Secondary | ICD-10-CM

## 2021-06-13 DIAGNOSIS — K7030 Alcoholic cirrhosis of liver without ascites: Secondary | ICD-10-CM | POA: Diagnosis not present

## 2021-06-13 DIAGNOSIS — R161 Splenomegaly, not elsewhere classified: Secondary | ICD-10-CM | POA: Diagnosis not present

## 2021-06-13 DIAGNOSIS — D693 Immune thrombocytopenic purpura: Secondary | ICD-10-CM | POA: Diagnosis not present

## 2021-06-13 MED ORDER — ROMIPLOSTIM 250 MCG ~~LOC~~ SOLR
3.9500 ug/kg | Freq: Once | SUBCUTANEOUS | Status: AC
  Administered 2021-06-13: 250 ug via SUBCUTANEOUS
  Filled 2021-06-13: qty 0.5

## 2021-06-13 NOTE — Patient Instructions (Signed)
Romiplostim injection What is this medication? ROMIPLOSTIM (roe mi PLOE stim) helps your body make more platelets. This medicine is used to treat low platelets caused by chronic idiopathic thrombocytopenic purpura (ITP) or a bone marrow syndrome caused by radiation sickness. This medicine may be used for other purposes; ask your health care provider or pharmacist if you have questions. COMMON BRAND NAME(S): Nplate What should I tell my care team before I take this medication? They need to know if you have any of these conditions: blood clots myelodysplastic syndrome an unusual or allergic reaction to romiplostim, mannitol, other medicines, foods, dyes, or preservatives pregnant or trying to get pregnant breast-feeding How should I use this medication? This medicine is injected under the skin. It is given by a health care provider in a hospital or clinic setting. A special MedGuide will be given to you before each treatment. Be sure to read this information carefully each time. Talk to your health care provider about the use of this medicine in children. While it may be prescribed for children as young as newborns for selected conditions, precautions do apply. Overdosage: If you think you have taken too much of this medicine contact a poison control center or emergency room at once. NOTE: This medicine is only for you. Do not share this medicine with others. What if I miss a dose? Keep appointments for follow-up doses. It is important not to miss your dose. Call your health care provider if you are unable to keep an appointment. What may interact with this medication? Interactions are not expected. This list may not describe all possible interactions. Give your health care provider a list of all the medicines, herbs, non-prescription drugs, or dietary supplements you use. Also tell them if you smoke, drink alcohol, or use illegal drugs. Some items may interact with your medicine. What should I  watch for while using this medication? Visit your health care provider for regular checks on your progress. You may need blood work done while you are taking this medicine. Your condition will be monitored carefully while you are receiving this medicine. It is important not to miss any appointments. What side effects may I notice from receiving this medication? Side effects that you should report to your doctor or health care professional as soon as possible: allergic reactions (skin rash, itching or hives; swelling of the face, lips, or tongue) bleeding (bloody or black, tarry stools; red or dark brown urine; spitting up blood or brown material that looks like coffee grounds; red spots on the skin; unusual bruising or bleeding from the eyes, gums, or nose) blood clot (chest pain; shortness of breath; pain, swelling, or warmth in the leg) stroke (changes in vision; confusion; trouble speaking or understanding; severe headaches; sudden numbness or weakness of the face, arm or leg; trouble walking; dizziness; loss of balance or coordination) Side effects that usually do not require medical attention (report to your doctor or health care professional if they continue or are bothersome): diarrhea dizziness headache joint pain muscle pain stomach pain trouble sleeping This list may not describe all possible side effects. Call your doctor for medical advice about side effects. You may report side effects to FDA at 1-800-FDA-1088. Where should I keep my medication? This medicine is given in a hospital or clinic. It will not be stored at home. NOTE: This sheet is a summary. It may not cover all possible information. If you have questions about this medicine, talk to your doctor, pharmacist, or health care provider.    2022 Elsevier/Gold Standard (2019-10-11 10:28:13)  

## 2021-06-14 ENCOUNTER — Other Ambulatory Visit (HOSPITAL_COMMUNITY): Payer: Self-pay

## 2021-06-15 NOTE — Progress Notes (Signed)
Spoke with [employer] and scheduled transportation for DOS 10/13, 10/19, 10/24, 10/26, and 10/31.

## 2021-06-15 NOTE — Progress Notes (Signed)
[facility]  [address] [place],  [place]  [address] [phone]  Clinic Day:  06/21/2021  Referring physician: Volanda Napoleon, MD  This document serves as a record of services personally performed by Dequincy Macarthur Critchley, MD. It was created on their behalf by [facility], a trained medical scribe. The creation of this record is based on the scribe's personal observations and the provider's statements to them.  HISTORY OF PRESENT ILLNESS:  The patient is a 50 y.o. female  with thrombocytopenia.  Per past records, alcoholic cirrrhosis and secondary splenomegaly are major reasons behind her low platelets.  However, there has also been an immune component to where she was receiving IV Nplate in [place].  Over these past months, she has been receiving weekly Nplate to keep her platelets at satisfactory levels.  Overall, she has been doing well.  She denies having any bleeding/bruising issues which concern her for severe thrombocytopenia being an issue.  She claims she has not used any drugs recently, but still consumes alcohol.  She smells of alcohol today.    PHYSICAL EXAM:  Blood pressure 130/77, pulse (!) 112, temperature 97.7 F (36.5 C), resp. rate 16, height 5' (1.524 m), weight 146 lb (66.2 kg), SpO2 97 %. Wt Readings from Last 3 Encounters:  06/21/21 146 lb (66.2 kg)  06/20/21 145 lb 4 oz (65.9 kg)  06/13/21 141 lb (64 kg)   Body mass index is 28.51 kg/m. Performance status (ECOG): 1 Physical Exam Constitutional:      Appearance: Normal appearance. She is not ill-appearing.     Comments: She smells of alcohol  HENT:     Mouth/Throat:     Mouth: Mucous membranes are moist.     Pharynx: Oropharynx is clear. No oropharyngeal exudate or posterior oropharyngeal erythema.  Cardiovascular:     Rate and Rhythm: Regular rhythm. Tachycardia present.     Heart sounds: No murmur heard.   No friction rub. No gallop.  Pulmonary:     Effort:  Pulmonary effort is normal. No respiratory distress.     Breath sounds: Normal breath sounds. No wheezing, rhonchi or rales.  Abdominal:     General: Abdomen is protuberant. Bowel sounds are normal. There is no distension.     Palpations: Abdomen is soft. There is no mass.     Tenderness: There is no abdominal tenderness.  Musculoskeletal:        General: No swelling.     Right lower leg: No edema.     Left lower leg: No edema.  Lymphadenopathy:     Cervical: No cervical adenopathy.     Upper Body:     Right upper body: No supraclavicular or axillary adenopathy.     Left upper body: No supraclavicular or axillary adenopathy.     Lower Body: No right inguinal adenopathy. No left inguinal adenopathy.  Skin:    General: Skin is warm.     Coloration: Skin is not jaundiced.     Findings: No lesion or rash.  Neurological:     General: No focal deficit present.     Mental Status: She is alert and oriented to person, place, and time. Mental status is at baseline.     Cranial Nerves: Cranial nerves are intact.  Psychiatric:        Mood and Affect: Mood normal.        Behavior: Behavior normal.        Thought Content: Thought content normal.  LABS:     CMP Latest Ref Rng & Units 06/21/2021 04/08/2021 04/07/2021  Glucose 70 - 99 mg/dL - 98 95  BUN 4 - 21 4 8 8   Creatinine 0.5 - 1.1 0.6 0.51 0.50  Sodium 137 - 147 145 135 133(L)  Potassium 3.4 - 5.3 3.6 4.1 4.3  Chloride 99 - 108 107 104 105  CO2 13 - 22 22 23 24   Calcium 8.7 - 10.7 7.6(A) 8.8(L) 8.4(L)  Total Protein 6.5 - 8.1 g/dL - 6.1(L) 6.3(L)  Total Bilirubin 0.3 - 1.2 mg/dL - 1.4(H) 1.7(H)  Alkaline Phos 25 - 125 380(A) 153(H) 190(H)  AST 13 - 35 299(A) 131(H) 192(H)  ALT 7 - 35 66(A) 76(H) 92(H)   ASSESSMENT & PLAN:  A 50 y.o. 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  Her platelets are holding at decent levels with her weekly Nplate therapy, which she  will continue to receive.  However, I am very concerned with her worsening liver panel today.  Her continued alcohol consumption in undoubtedly contributing to her declining liver function.  I made it very clear to the patient that she is on the precipice of damaging her liver to the point where it will impact her mortality.  I will have her see a GI specialist in the forthcoming days to re-evaluate her clinical picture.   I will see her back in 4 months for repeat clinical assessment.  The patient understands all the plans discussed today and is in agreement with them.   I, Rita Ohara, am acting as scribe for Marice Potter, MD    I have reviewed this report as typed by the medical scribe, and it is complete and accurate.  Dequincy Macarthur Critchley, MD

## 2021-06-18 ENCOUNTER — Other Ambulatory Visit: Payer: Self-pay

## 2021-06-18 ENCOUNTER — Encounter: Payer: Self-pay | Admitting: Oncology

## 2021-06-18 ENCOUNTER — Inpatient Hospital Stay: Payer: Medicaid Other

## 2021-06-18 DIAGNOSIS — D696 Thrombocytopenia, unspecified: Secondary | ICD-10-CM

## 2021-06-20 ENCOUNTER — Inpatient Hospital Stay: Payer: Medicaid Other

## 2021-06-20 ENCOUNTER — Other Ambulatory Visit: Payer: Self-pay

## 2021-06-20 VITALS — BP 135/74 | HR 90 | Temp 98.5°F | Resp 18 | Ht 60.0 in | Wt 145.2 lb

## 2021-06-20 DIAGNOSIS — D693 Immune thrombocytopenic purpura: Secondary | ICD-10-CM | POA: Diagnosis not present

## 2021-06-20 DIAGNOSIS — D696 Thrombocytopenia, unspecified: Secondary | ICD-10-CM

## 2021-06-20 MED ORDER — ROMIPLOSTIM 250 MCG ~~LOC~~ SOLR
3.9400 ug/kg | Freq: Once | SUBCUTANEOUS | Status: AC
  Administered 2021-06-20: 250 ug via SUBCUTANEOUS
  Filled 2021-06-20: qty 0.5

## 2021-06-20 NOTE — Patient Instructions (Signed)
Romiplostim injection What is this medication? ROMIPLOSTIM (roe mi PLOE stim) helps your body make more platelets. This medicine is used to treat low platelets caused by chronic idiopathic thrombocytopenic purpura (ITP) or a bone marrow syndrome caused by radiation sickness. This medicine may be used for other purposes; ask your health care provider or pharmacist if you have questions. COMMON BRAND NAME(S): Nplate What should I tell my care team before I take this medication? They need to know if you have any of these conditions: blood clots myelodysplastic syndrome an unusual or allergic reaction to romiplostim, mannitol, other medicines, foods, dyes, or preservatives pregnant or trying to get pregnant breast-feeding How should I use this medication? This medicine is injected under the skin. It is given by a health care provider in a hospital or clinic setting. A special MedGuide will be given to you before each treatment. Be sure to read this information carefully each time. Talk to your health care provider about the use of this medicine in children. While it may be prescribed for children as young as newborns for selected conditions, precautions do apply. Overdosage: If you think you have taken too much of this medicine contact a poison control center or emergency room at once. NOTE: This medicine is only for you. Do not share this medicine with others. What if I miss a dose? Keep appointments for follow-up doses. It is important not to miss your dose. Call your health care provider if you are unable to keep an appointment. What may interact with this medication? Interactions are not expected. This list may not describe all possible interactions. Give your health care provider a list of all the medicines, herbs, non-prescription drugs, or dietary supplements you use. Also tell them if you smoke, drink alcohol, or use illegal drugs. Some items may interact with your medicine. What should I  watch for while using this medication? Visit your health care provider for regular checks on your progress. You may need blood work done while you are taking this medicine. Your condition will be monitored carefully while you are receiving this medicine. It is important not to miss any appointments. What side effects may I notice from receiving this medication? Side effects that you should report to your doctor or health care professional as soon as possible: allergic reactions (skin rash, itching or hives; swelling of the face, lips, or tongue) bleeding (bloody or black, tarry stools; red or dark brown urine; spitting up blood or brown material that looks like coffee grounds; red spots on the skin; unusual bruising or bleeding from the eyes, gums, or nose) blood clot (chest pain; shortness of breath; pain, swelling, or warmth in the leg) stroke (changes in vision; confusion; trouble speaking or understanding; severe headaches; sudden numbness or weakness of the face, arm or leg; trouble walking; dizziness; loss of balance or coordination) Side effects that usually do not require medical attention (report to your doctor or health care professional if they continue or are bothersome): diarrhea dizziness headache joint pain muscle pain stomach pain trouble sleeping This list may not describe all possible side effects. Call your doctor for medical advice about side effects. You may report side effects to FDA at 1-800-FDA-1088. Where should I keep my medication? This medicine is given in a hospital or clinic. It will not be stored at home. NOTE: This sheet is a summary. It may not cover all possible information. If you have questions about this medicine, talk to your doctor, pharmacist, or health care provider.    2022 Elsevier/Gold Standard (2019-10-11 10:28:13)  

## 2021-06-21 ENCOUNTER — Other Ambulatory Visit: Payer: Self-pay | Admitting: Hematology and Oncology

## 2021-06-21 ENCOUNTER — Inpatient Hospital Stay: Payer: Medicaid Other

## 2021-06-21 ENCOUNTER — Inpatient Hospital Stay (INDEPENDENT_AMBULATORY_CARE_PROVIDER_SITE_OTHER): Payer: Medicaid Other | Admitting: Oncology

## 2021-06-21 VITALS — BP 130/77 | HR 112 | Temp 97.7°F | Resp 16 | Ht 60.0 in | Wt 146.0 lb

## 2021-06-21 DIAGNOSIS — D696 Thrombocytopenia, unspecified: Secondary | ICD-10-CM

## 2021-06-21 LAB — CBC AND DIFFERENTIAL
HCT: 39 (ref 36–46)
Hemoglobin: 12.6 (ref 12.0–16.0)
Neutrophils Absolute: 1.43
Platelets: 55 — AB (ref 150–399)
WBC: 2.3

## 2021-06-21 LAB — HEPATIC FUNCTION PANEL
ALT: 66 — AB (ref 7–35)
AST: 299 — AB (ref 13–35)
Alkaline Phosphatase: 380 — AB (ref 25–125)
Bilirubin, Total: 5.3

## 2021-06-21 LAB — BASIC METABOLIC PANEL
BUN: 4 (ref 4–21)
CO2: 22 (ref 13–22)
Chloride: 107 (ref 99–108)
Creatinine: 0.6 (ref 0.5–1.1)
Glucose: 73
Potassium: 3.6 (ref 3.4–5.3)
Sodium: 145 (ref 137–147)

## 2021-06-21 LAB — COMPREHENSIVE METABOLIC PANEL
Albumin: 3.7 (ref 3.5–5.0)
Calcium: 7.6 — AB (ref 8.7–10.7)

## 2021-06-21 LAB — CBC
MCV: 93 (ref 81–99)
RBC: 4.15 (ref 3.87–5.11)

## 2021-06-23 ENCOUNTER — Other Ambulatory Visit (HOSPITAL_COMMUNITY): Payer: Self-pay

## 2021-06-23 MED ORDER — CHLORDIAZEPOXIDE HCL 25 MG PO CAPS
ORAL_CAPSULE | ORAL | 0 refills | Status: DC
  Filled 2021-06-23: qty 6, 2d supply, fill #0

## 2021-06-24 ENCOUNTER — Encounter: Payer: Self-pay | Admitting: Oncology

## 2021-06-25 ENCOUNTER — Inpatient Hospital Stay (HOSPITAL_COMMUNITY)
Admission: EM | Admit: 2021-06-25 | Discharge: 2021-06-29 | DRG: 441 | Disposition: A | Discharge status: Home / Self Care | Payer: Medicaid Other | Attending: Family Medicine | Admitting: Family Medicine

## 2021-06-25 ENCOUNTER — Emergency Department (HOSPITAL_COMMUNITY): Payer: Medicaid Other

## 2021-06-25 ENCOUNTER — Inpatient Hospital Stay: Payer: Medicaid Other

## 2021-06-25 DIAGNOSIS — F1024 Alcohol dependence with alcohol-induced mood disorder: Secondary | ICD-10-CM | POA: Diagnosis present

## 2021-06-25 DIAGNOSIS — R188 Other ascites: Secondary | ICD-10-CM

## 2021-06-25 DIAGNOSIS — D689 Coagulation defect, unspecified: Secondary | ICD-10-CM | POA: Diagnosis present

## 2021-06-25 DIAGNOSIS — E871 Hypo-osmolality and hyponatremia: Secondary | ICD-10-CM | POA: Diagnosis present

## 2021-06-25 DIAGNOSIS — K7031 Alcoholic cirrhosis of liver with ascites: Secondary | ICD-10-CM | POA: Diagnosis not present

## 2021-06-25 DIAGNOSIS — K700 Alcoholic fatty liver: Secondary | ICD-10-CM | POA: Diagnosis present

## 2021-06-25 DIAGNOSIS — D696 Thrombocytopenia, unspecified: Secondary | ICD-10-CM | POA: Diagnosis present

## 2021-06-25 DIAGNOSIS — G4485 Primary stabbing headache: Secondary | ICD-10-CM | POA: Diagnosis not present

## 2021-06-25 DIAGNOSIS — I8511 Secondary esophageal varices with bleeding: Principal | ICD-10-CM

## 2021-06-25 DIAGNOSIS — F250 Schizoaffective disorder, bipolar type: Secondary | ICD-10-CM | POA: Diagnosis present

## 2021-06-25 DIAGNOSIS — F1020 Alcohol dependence, uncomplicated: Secondary | ICD-10-CM | POA: Diagnosis not present

## 2021-06-25 DIAGNOSIS — F1994 Other psychoactive substance use, unspecified with psychoactive substance-induced mood disorder: Secondary | ICD-10-CM

## 2021-06-25 DIAGNOSIS — D693 Immune thrombocytopenic purpura: Secondary | ICD-10-CM

## 2021-06-25 DIAGNOSIS — F10239 Alcohol dependence with withdrawal, unspecified: Secondary | ICD-10-CM | POA: Diagnosis present

## 2021-06-25 DIAGNOSIS — Z79899 Other long term (current) drug therapy: Secondary | ICD-10-CM | POA: Diagnosis not present

## 2021-06-25 DIAGNOSIS — B1920 Unspecified viral hepatitis C without hepatic coma: Secondary | ICD-10-CM | POA: Diagnosis present

## 2021-06-25 DIAGNOSIS — F419 Anxiety disorder, unspecified: Secondary | ICD-10-CM | POA: Diagnosis present

## 2021-06-25 DIAGNOSIS — R569 Unspecified convulsions: Secondary | ICD-10-CM | POA: Diagnosis not present

## 2021-06-25 DIAGNOSIS — G40909 Epilepsy, unspecified, not intractable, without status epilepticus: Secondary | ICD-10-CM

## 2021-06-25 DIAGNOSIS — K7030 Alcoholic cirrhosis of liver without ascites: Secondary | ICD-10-CM | POA: Diagnosis not present

## 2021-06-25 DIAGNOSIS — D61818 Other pancytopenia: Secondary | ICD-10-CM | POA: Diagnosis present

## 2021-06-25 DIAGNOSIS — G4700 Insomnia, unspecified: Secondary | ICD-10-CM | POA: Diagnosis present

## 2021-06-25 DIAGNOSIS — L538 Other specified erythematous conditions: Secondary | ICD-10-CM | POA: Diagnosis present

## 2021-06-25 DIAGNOSIS — I8510 Secondary esophageal varices without bleeding: Secondary | ICD-10-CM | POA: Diagnosis not present

## 2021-06-25 DIAGNOSIS — R040 Epistaxis: Secondary | ICD-10-CM | POA: Diagnosis present

## 2021-06-25 DIAGNOSIS — K7460 Unspecified cirrhosis of liver: Secondary | ICD-10-CM

## 2021-06-25 DIAGNOSIS — K766 Portal hypertension: Secondary | ICD-10-CM | POA: Diagnosis present

## 2021-06-25 DIAGNOSIS — K760 Fatty (change of) liver, not elsewhere classified: Secondary | ICD-10-CM | POA: Diagnosis present

## 2021-06-25 DIAGNOSIS — F10939 Alcohol use, unspecified with withdrawal, unspecified: Secondary | ICD-10-CM | POA: Diagnosis not present

## 2021-06-25 DIAGNOSIS — K920 Hematemesis: Secondary | ICD-10-CM

## 2021-06-25 DIAGNOSIS — K860 Alcohol-induced chronic pancreatitis: Secondary | ICD-10-CM | POA: Diagnosis present

## 2021-06-25 DIAGNOSIS — K7011 Alcoholic hepatitis with ascites: Secondary | ICD-10-CM | POA: Diagnosis present

## 2021-06-25 DIAGNOSIS — K7682 Hepatic encephalopathy: Secondary | ICD-10-CM | POA: Diagnosis present

## 2021-06-25 DIAGNOSIS — Z20822 Contact with and (suspected) exposure to covid-19: Secondary | ICD-10-CM | POA: Diagnosis present

## 2021-06-25 HISTORY — DX: Other specified anxiety disorders: F41.8

## 2021-06-25 HISTORY — DX: Unspecified injury of head, initial encounter: S09.90XA

## 2021-06-25 HISTORY — DX: Olecranon bursitis, left elbow: M70.22

## 2021-06-25 HISTORY — DX: Lumbago with sciatica, right side: M54.42

## 2021-06-25 HISTORY — DX: Suicidal ideations: R45.851

## 2021-06-25 HISTORY — DX: Esophageal varices without bleeding: I85.00

## 2021-06-25 HISTORY — DX: Lumbago with sciatica, right side: M54.41

## 2021-06-25 HISTORY — DX: Alcohol induced acute pancreatitis without necrosis or infection: K85.20

## 2021-06-25 HISTORY — DX: Alcoholic cirrhosis of liver with ascites: K70.31

## 2021-06-25 LAB — URINALYSIS, ROUTINE W REFLEX MICROSCOPIC
Bacteria, UA: NONE SEEN
Glucose, UA: NEGATIVE mg/dL
Ketones, ur: 20 mg/dL — AB
Leukocytes,Ua: NEGATIVE
Nitrite: NEGATIVE
Protein, ur: NEGATIVE mg/dL
Specific Gravity, Urine: 1.011 (ref 1.005–1.030)
pH: 6 (ref 5.0–8.0)

## 2021-06-25 LAB — TROPONIN I (HIGH SENSITIVITY)
Troponin I (High Sensitivity): 8 ng/L (ref <18)
Troponin I (High Sensitivity): 8 ng/L (ref <18)

## 2021-06-25 LAB — PROTIME-INR
INR: 1.2 (ref 0.8–1.2)
Prothrombin Time: 15.5 seconds — ABNORMAL HIGH (ref 11.4–15.2)

## 2021-06-25 LAB — CBC WITH DIFFERENTIAL/PLATELET
Abs Immature Granulocytes: 0.02 10*3/uL (ref 0.00–0.07)
Basophils Absolute: 0.1 10*3/uL (ref 0.0–0.1)
Basophils Relative: 1 %
Eosinophils Absolute: 0.2 10*3/uL (ref 0.0–0.5)
Eosinophils Relative: 4 %
HCT: 33.9 % — ABNORMAL LOW (ref 36.0–46.0)
Hemoglobin: 11.5 g/dL — ABNORMAL LOW (ref 12.0–15.0)
Immature Granulocytes: 0 %
Lymphocytes Relative: 9 %
Lymphs Abs: 0.4 10*3/uL — ABNORMAL LOW (ref 0.7–4.0)
MCH: 30.8 pg (ref 26.0–34.0)
MCHC: 33.9 g/dL (ref 30.0–36.0)
MCV: 90.9 fL (ref 80.0–100.0)
Monocytes Absolute: 0.8 10*3/uL (ref 0.1–1.0)
Monocytes Relative: 16 %
Neutro Abs: 3.3 10*3/uL (ref 1.7–7.7)
Neutrophils Relative %: 70 %
Platelets: 57 10*3/uL — ABNORMAL LOW (ref 150–400)
RBC: 3.73 MIL/uL — ABNORMAL LOW (ref 3.87–5.11)
RDW: 19.8 % — ABNORMAL HIGH (ref 11.5–15.5)
WBC: 4.7 10*3/uL (ref 4.0–10.5)
nRBC: 0 % (ref 0.0–0.2)

## 2021-06-25 LAB — RESP PANEL BY RT-PCR (FLU A&B, COVID) ARPGX2
Influenza A by PCR: NEGATIVE
Influenza B by PCR: NEGATIVE
SARS Coronavirus 2 by RT PCR: NEGATIVE

## 2021-06-25 LAB — COMPREHENSIVE METABOLIC PANEL
ALT: 57 U/L — ABNORMAL HIGH (ref 0–44)
AST: 266 U/L — ABNORMAL HIGH (ref 15–41)
Albumin: 2.9 g/dL — ABNORMAL LOW (ref 3.5–5.0)
Alkaline Phosphatase: 267 U/L — ABNORMAL HIGH (ref 38–126)
Anion gap: 16 — ABNORMAL HIGH (ref 5–15)
BUN: <5 mg/dL — ABNORMAL LOW (ref 6–20)
CO2: 23 mmol/L (ref 22–32)
Calcium: 8.5 mg/dL — ABNORMAL LOW (ref 8.9–10.3)
Chloride: 96 mmol/L — ABNORMAL LOW (ref 98–111)
Creatinine, Ser: 0.7 mg/dL (ref 0.44–1.00)
GFR, Estimated: >60 mL/min (ref >60)
Glucose, Bld: 100 mg/dL — ABNORMAL HIGH (ref 70–99)
Potassium: 3.1 mmol/L — ABNORMAL LOW (ref 3.5–5.1)
Sodium: 135 mmol/L (ref 135–145)
Total Bilirubin: 8 mg/dL — ABNORMAL HIGH (ref 0.3–1.2)
Total Protein: 7.3 g/dL (ref 6.5–8.1)

## 2021-06-25 LAB — AMMONIA: Ammonia: 71 umol/L — ABNORMAL HIGH (ref 9–35)

## 2021-06-25 LAB — POC OCCULT BLOOD, ED: Fecal Occult Bld: POSITIVE — AB

## 2021-06-25 LAB — LACTIC ACID, PLASMA
Lactic Acid, Venous: 2.8 mmol/L (ref 0.5–1.9)
Lactic Acid, Venous: 2.8 mmol/L (ref 0.5–1.9)

## 2021-06-25 LAB — LIPASE, BLOOD: Lipase: 63 U/L — ABNORMAL HIGH (ref 11–51)

## 2021-06-25 MED ORDER — SODIUM CHLORIDE 0.9 % IV SOLN
50.0000 ug/h | INTRAVENOUS | Status: AC
  Administered 2021-06-25 – 2021-06-28 (×7): 50 ug/h via INTRAVENOUS
  Filled 2021-06-25 (×9): qty 1

## 2021-06-25 MED ORDER — OCTREOTIDE LOAD VIA INFUSION
50.0000 ug | Freq: Once | INTRAVENOUS | Status: AC
  Administered 2021-06-25: 50 ug via INTRAVENOUS
  Filled 2021-06-25: qty 25

## 2021-06-25 MED ORDER — LORAZEPAM 1 MG PO TABS
1.0000 mg | ORAL_TABLET | ORAL | Status: DC | PRN
  Administered 2021-06-25: 1 mg via ORAL
  Administered 2021-06-26: 4 mg via ORAL
  Administered 2021-06-26: 1 mg via ORAL
  Filled 2021-06-25: qty 1
  Filled 2021-06-25: qty 4
  Filled 2021-06-25: qty 1

## 2021-06-25 MED ORDER — CHLORDIAZEPOXIDE HCL 25 MG PO CAPS
25.0000 mg | ORAL_CAPSULE | Freq: Every day | ORAL | Status: AC
  Administered 2021-06-28: 25 mg via ORAL
  Filled 2021-06-25: qty 1

## 2021-06-25 MED ORDER — PANTOPRAZOLE INFUSION (NEW) - SIMPLE MED
8.0000 mg/h | INTRAVENOUS | Status: DC
  Administered 2021-06-25 – 2021-06-28 (×7): 8 mg/h via INTRAVENOUS
  Filled 2021-06-25: qty 80
  Filled 2021-06-25: qty 100
  Filled 2021-06-25: qty 80
  Filled 2021-06-25: qty 100
  Filled 2021-06-25 (×5): qty 80

## 2021-06-25 MED ORDER — ADULT MULTIVITAMIN W/MINERALS CH
1.0000 | ORAL_TABLET | Freq: Every day | ORAL | Status: DC
  Administered 2021-06-26 – 2021-06-29 (×4): 1 via ORAL
  Filled 2021-06-25 (×4): qty 1

## 2021-06-25 MED ORDER — SODIUM CHLORIDE 0.9 % IV BOLUS
250.0000 mL | Freq: Once | INTRAVENOUS | Status: AC
  Administered 2021-06-25: 250 mL via INTRAVENOUS

## 2021-06-25 MED ORDER — THIAMINE HCL 100 MG/ML IJ SOLN: 100.0000 mg | Freq: Every day | INTRAMUSCULAR | Status: DC

## 2021-06-25 MED ORDER — PANTOPRAZOLE 80MG IVPB - SIMPLE MED
80.0000 mg | Freq: Once | INTRAVENOUS | Status: AC
  Administered 2021-06-25: 80 mg via INTRAVENOUS
  Filled 2021-06-25: qty 80

## 2021-06-25 MED ORDER — CHLORDIAZEPOXIDE HCL 25 MG PO CAPS
25.0000 mg | ORAL_CAPSULE | Freq: Three times a day (TID) | ORAL | Status: AC
  Administered 2021-06-26 – 2021-06-27 (×3): 25 mg via ORAL
  Filled 2021-06-25 (×3): qty 1

## 2021-06-25 MED ORDER — CHLORDIAZEPOXIDE HCL 25 MG PO CAPS
25.0000 mg | ORAL_CAPSULE | Freq: Four times a day (QID) | ORAL | Status: AC
  Administered 2021-06-25 – 2021-06-26 (×3): 25 mg via ORAL
  Filled 2021-06-25 (×3): qty 1

## 2021-06-25 MED ORDER — FOLIC ACID 5 MG/ML IJ SOLN
1.0000 mg | Freq: Every day | INTRAMUSCULAR | Status: DC
  Administered 2021-06-25 – 2021-06-27 (×2): 1 mg via INTRAVENOUS
  Filled 2021-06-25 (×6): qty 0.2

## 2021-06-25 MED ORDER — ONDANSETRON 4 MG PO TBDP
4.0000 mg | ORAL_TABLET | Freq: Once | ORAL | Status: AC
  Administered 2021-06-25: 4 mg via ORAL
  Filled 2021-06-25: qty 1

## 2021-06-25 MED ORDER — PANTOPRAZOLE SODIUM 40 MG IV SOLR
40.0000 mg | Freq: Two times a day (BID) | INTRAVENOUS | Status: DC
  Filled 2021-06-25: qty 40

## 2021-06-25 MED ORDER — SODIUM CHLORIDE 0.9 % IV SOLN
1.0000 g | INTRAVENOUS | Status: DC
  Administered 2021-06-25 – 2021-06-27 (×3): 1 g via INTRAVENOUS
  Filled 2021-06-25 (×3): qty 10

## 2021-06-25 MED ORDER — LEVETIRACETAM 750 MG PO TABS
750.0000 mg | ORAL_TABLET | Freq: Two times a day (BID) | ORAL | Status: DC
  Administered 2021-06-25 – 2021-06-29 (×8): 750 mg via ORAL
  Filled 2021-06-25 (×10): qty 1

## 2021-06-25 MED ORDER — FLUOXETINE HCL 20 MG PO CAPS
40.0000 mg | ORAL_CAPSULE | Freq: Every day | ORAL | Status: DC
  Administered 2021-06-25 – 2021-06-29 (×5): 40 mg via ORAL
  Filled 2021-06-25 (×5): qty 2

## 2021-06-25 MED ORDER — FUROSEMIDE 40 MG PO TABS
80.0000 mg | ORAL_TABLET | Freq: Every day | ORAL | Status: DC
  Administered 2021-06-25 – 2021-06-27 (×3): 80 mg via ORAL
  Filled 2021-06-25: qty 2
  Filled 2021-06-25 (×2): qty 4

## 2021-06-25 MED ORDER — MORPHINE SULFATE (PF) 2 MG/ML IV SOLN
2.0000 mg | Freq: Once | INTRAVENOUS | Status: AC
Start: 2021-06-25 — End: 2021-06-25
  Administered 2021-06-25: 2 mg via INTRAVENOUS
  Filled 2021-06-25: qty 1

## 2021-06-25 MED ORDER — ARIPIPRAZOLE 10 MG PO TABS
10.0000 mg | ORAL_TABLET | Freq: Every day | ORAL | Status: DC
  Administered 2021-06-25 – 2021-06-29 (×5): 10 mg via ORAL
  Filled 2021-06-25 (×6): qty 1

## 2021-06-25 MED ORDER — LACTULOSE 10 GM/15ML PO SOLN
20.0000 g | Freq: Once | ORAL | Status: AC
  Administered 2021-06-25: 20 g via ORAL
  Filled 2021-06-25: qty 30

## 2021-06-25 MED ORDER — CHLORDIAZEPOXIDE HCL 25 MG PO CAPS
25.0000 mg | ORAL_CAPSULE | ORAL | Status: AC
  Administered 2021-06-27 – 2021-06-28 (×2): 25 mg via ORAL
  Filled 2021-06-25 (×2): qty 1

## 2021-06-25 MED ORDER — LORAZEPAM 2 MG/ML IJ SOLN
1.0000 mg | INTRAMUSCULAR | Status: DC | PRN
  Administered 2021-06-26 – 2021-06-27 (×3): 1 mg via INTRAVENOUS
  Filled 2021-06-25 (×3): qty 1

## 2021-06-25 MED ORDER — THIAMINE HCL 100 MG PO TABS
100.0000 mg | ORAL_TABLET | Freq: Every day | ORAL | Status: DC
  Administered 2021-06-25 – 2021-06-29 (×5): 100 mg via ORAL
  Filled 2021-06-25 (×6): qty 1

## 2021-06-25 MED ORDER — LACTULOSE 10 GM/15ML PO SOLN
20.0000 g | Freq: Three times a day (TID) | ORAL | Status: DC
  Administered 2021-06-25 – 2021-06-28 (×9): 20 g via ORAL
  Filled 2021-06-25 (×11): qty 30

## 2021-06-25 NOTE — H&P (Addendum)
[facility] Admission History and Physical Service Pager: [phone]  Patient name: Briana Sweeney Medical record number: [id number] Date of birth: 10/20/1970 Age: 50 y.o. Gender: female  Primary Care Provider: [name], FNP (Inactive) Consultants: GI Code Status: FULL Preferred Emergency Contact: Ranae Pila (sister) [phone]  Chief Complaint: Hematemesis  Assessment and Plan: Briana Sweeney is a 50 y.o. female presenting with hematemesis, melena and hematochezia. PMH is significant for alcohol use disorder, cirrhosis, ascites, seizure disorder, and bipolar/schizophrenia.   Hematemesis  Concern for variceal bleed Patient first presented to [facility] ED on 06/22/2021 due to hematemesis, melena, and hematochezia. CT of abdomen pelvis from 06/22/2021 showed advanced hepatic steatosis and cirrhosis with evidence of portal hypertension, large esophageal varices, and small volume ascites. Patient was discharged from [facility] ED on 06/23/2021 as they do not have a GI service.  Rather than going to [facility] had initially planned, she came here to have further evaluation with GI.  In ED today, patient tachycardic but remainder of vitals stable. Hgb 11.6 which is unchanged from prior value on 10/14 per review of records from [place]. In the ED she had a positive FOBT, lactic acid of 2.8, elevated LFTs and ammonia (see below), lipase of 63.  -Admit to FPTS, progressive, Attending Dr. McDiarmid  -GI consulted, appreciate recommendations -IV Octreotide 25 mL/hr -IV pantoprazole 10 mL/h for 72 hours -IV pantoprazole 40 mg twice daily starting 06/29/2021 -CTX for SBP prophylaxis -Continuous cardiac monitoring -Keep NPO -AM magnesium -AM CBC -AM CMP  Cirrhosis  Ascites Patient with known cirrhosis and ascites. MELD-Na score of 19 on admission, 3-4% mortality. ALT 57, AST of 266, alk phos of 267, total bili of 8, albumin of 2.9, INR of  1.2, PTT of 15.5, ammonia of 71.  Patient currently has significant abdominal ascites, palmar erythema, and bilateral lower pitting extremity edema. home medication includes lactulose, lasix. Despite elevated ammonia, patient mentating appropriately- no evidence of hepatic encephalopathy at this time. -Ceftriaxone 1 g daily for SBP prophylaxis -Continue home lactulose 20 g TID -Continue Lasix 80 mg daily   Alcohol abuse Patient states she drinks a pint of vodka daily for the past 10 years and feels sick when she does not drink alcohol.  Last drink was yesterday at 9:00 PM. Patient states she currently feels agitated and like she is starting to have withdrawal symptoms.  On exam she is tachycardic and has a slight tremor in bilateral hands. 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  Platelet count today is 57.  -Daily CBC  Depression  schizophrenia  bipolar disorder Home medications include Abilify and Prozac -Continue home Abilify 10 mg daily and Prozac 40 mg daily  Seizure disorder Patient states she has had a seizure disorder since age 26 and has also had seizures due to alcohol withdrawal in the past.  Current home medications include Keppra 750 mg twice daily -Continue Keppra -Management of alcohol use disorder/withdrawal as above  History of hepatitis C -HCV RNA quant  FEN/GI: NPO Prophylaxis: SCDs  Disposition: Progressive  History of Present Illness:  Briana Sweeney is a 50 y.o. female presenting with hematemesis.  Patient reports "throwing up blood" for the past week. States it was initially dark red, but is now  bright red.  Also has noticed blood in her stool (mixture of black and bright red) for 1.5 weeks. Last night 1 episode of epistaxis.  No episodes of epistaxis prior to last night.  She placed some cotton in her nose which stopped the bleeding.  Endorses "heart beating hard" for 4 days but no chest pain. Also some shortness of breath and abdominal pain, both of which she attributes to her ascites. "Kidneys hurting me too" bilaterally.  States she has been urinating more frequently but with less volume.  States her urine is dark in color.  Denies fever/chills.  [place] a few days ago and hit her head on her nightstand. Dizzy, lightheaded x4 days. Headache rated 8/10. Was evaluated at [facility], got sutures, negative for intracranial bleed.  Reports she was supposed to be transferred to [facility] but didn't want to go because it was too far. Presented here today instead. Was told she has a blood clot in her stomach.  Drinks 1 pint of vodka per day over the past 10 years. Hx of withdrawal with seizures. Last drink yesterday 9pm. Starting to feel anxious, like she's coming out of her skin.  Review Of Systems: Per HPI with the following additions:   Review of Systems  Constitutional:  Positive for appetite change.  HENT:  Positive for nosebleeds.   Respiratory:  Positive for shortness of breath.   Cardiovascular:  Positive for palpitations.  Gastrointestinal:  Positive for abdominal distention, abdominal pain, blood in stool and vomiting.  Genitourinary:  Positive for frequency.  Musculoskeletal:  Positive for neck pain.  Neurological:  Positive for dizziness, tremors and headaches.  Psychiatric/Behavioral:  Positive for agitation.     Patient Active Problem List   Diagnosis Date Noted   Alcohol-induced acute pancreatitis    Hyperbilirubinemia    Acute low back pain with bilateral sciatica    AKI (acute kidney injury) ([place]) 04/04/2021   Left flank pain 04/04/2021   Acute on chronic pancreatitis ([place]) 04/04/2021   Transaminitis 04/04/2021   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[place]) 12/18/2020   Episodic mood disorder ([place])  08/08/2020   Left leg pain 03/28/2020   History of recent fall 03/28/2020   Abnormal urine 12/13/2019   History of alcohol abuse 09/07/2019   Insomnia 05/05/2019   Dizziness 04/02/2019   Bruising 04/02/2019   Hyponatremia 04/02/2019   Head injury    Scalp laceration, initial encounter    Fall 01/20/2019   Epistaxis 01/20/2019   Altered mental status    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[place]) 12/05/2018   Other mixed anxiety disorders 10/27/2018   Neuropathy 10/27/2018   Abscess of bursa of left elbow 09/23/2018   Olecranon bursitis of left elbow    Erysipelas 70/62/3762   Acute metabolic encephalopathy 83/15/1761   Overdose 08/22/2018   Hypotension 08/22/2018   Seizure ([place]) 08/22/2018   Substance induced mood disorder ([place]) 07/17/2018   Cocaine dependence without complication ([place]) 60/73/7106   Polysubstance abuse ([place]) 04/08/2018   Encephalopathy, portal systemic 04/08/2018   Hepatitis C 03/18/2018   Portal hypertension ([place]) 26/94/8546   Alcoholic intoxication without complication ([place]) 27/11/5007   Depression 03/18/2018   Upper GI bleed 02/10/2018   Alcohol use disorder, severe, in early remission ([place]) 02/10/2018   Chronic anemia    Thrombocytopenia due to drugs    Suicidal ideation    Thrombocytopenia ([place]) 01/02/2018   GERD (gastroesophageal reflux disease) 11/01/2017   Trichimoniasis 10/30/2017   Alcohol dependence ([place]) 10/29/2017   Acute hyperactive alcohol withdrawal delirium ([place]) 10/09/2017  Alcohol abuse with alcohol-induced mood disorder (HCC)    Alcohol withdrawal syndrome with complication ([place]) 53/61/4431   Esophageal varices without bleeding ([place])    Hematemesis 09/01/2017   Ascites due to alcoholic cirrhosis (HCC)    Decompensated hepatic cirrhosis ([place]) 07/23/2017   Alcohol abuse 07/23/2017   Hepatitis C antibody positive in blood 07/23/2017   Hypokalemia 07/23/2017   Jaundice 07/23/2017   Coagulopathy ([place]) 07/23/2017   Hypomagnesemia 07/23/2017    Pancytopenia ([place]) 54/00/8676   Alcoholic cirrhosis of liver with ascites ([place]) 07/23/2017   Bacterial vaginosis 06/03/2017   UTI (urinary tract infection) 06/02/2017    Past Medical History: Past Medical History:  Diagnosis Date   Alcohol use    Anxiety    Bipolar affective disorder ([place])    With anxiety features   Bunionette of right foot 11/2019   Cirrhosis of liver (HCC)    Due to alcohol and hepatitis C   Depression    Esophageal 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 (HCC)    ETOHism (HCC)    GERD (gastroesophageal reflux disease)    Hematemesis 02/10/2018   Hepatitis C 2018   hepatitis c and alcohol related hepatitis   Heroin abuse ([place])    History of blood transfusion    "blood doesn't clot; I fell down and had to have a transfusion"   History of kidney stones    History of thrombocytopenia    Menopause 2016   Migraine    "when I get really stressed" (09/01/2017)   S/P foot surgery, right 11/2019   Schizophrenia ([place])    Seizures ([place])    "when I run out of my RX; lots recently" (09/01/2017)   Thrombocytopenia ([place])    Urinary urgency 05/2020    Past Surgical History: Past Surgical History:  Procedure Laterality Date   BUNIONECTOMY     ESOPHAGOGASTRODUODENOSCOPY N/A 09/03/2017   Procedure: ESOPHAGOGASTRODUODENOSCOPY (EGD);  Surgeon: Doran Stabler, MD;  Location: [facility];  Service: Gastroenterology;  Laterality: N/A;   FINGER FRACTURE SURGERY Left    "shattered my pinky"   FRACTURE SURGERY     I & D EXTREMITY Left 09/18/2018   Procedure: IRRIGATION AND DEBRIDEMENT EXTREMITY;  Surgeon: Leanora Cover, MD;  Location: [facility];  Service: Orthopedics;  Laterality: Left;   IR PARACENTESIS  07/23/2017   IR PARACENTESIS  07/2017   "did it twice in the same week" (09/01/2017)   SHOULDER OPEN ROTATOR CUFF REPAIR Right    TOOTH EXTRACTION  08/2019   TUBAL LIGATION     VAGINAL HYSTERECTOMY      Social History: Social History   Tobacco Use   Smoking status: Never    Smokeless tobacco: Never  Vaping Use   Vaping Use: Never used  Substance Use Topics   Alcohol use: Not Currently    Alcohol/week: 6.0 standard drinks    Types: 6 Cans of beer per week    Comment: weekly   Drug use: Not Currently    Please also refer to relevant sections of EMR.  Family History: Family History  Problem Relation Age of Onset   Lung cancer Mother 20   Alcohol abuse Mother    Throat cancer Father 74     Allergies and Medications: Allergies  Allergen Reactions   Other Other (See Comments)    Platelets: Rx chest pain, tremors, body aches   No current facility-administered medications on file prior to encounter.   Current Outpatient Medications on File Prior to Encounter  Medication Sig Dispense Refill  ARIPiprazole (ABILIFY) 10 MG tablet Take 1 tablet by mouth daily 90 tablet 1   ARIPiprazole (ABILIFY) 10 MG tablet Take 1 tablet (10 mg total) by mouth daily. 30 tablet 2   chlordiazePOXIDE (LIBRIUM) 25 MG capsule Take 2 tablets by mouth every 6 hours for 2 doses, then take 1 tablet by mouth every 6 hours for 2 doses 6 capsule 0   clonazePAM (KLONOPIN) 0.5 MG tablet Take 1 tablet by mouth twice daily. 60 tablet 0   clonazePAM (KLONOPIN) 0.5 MG tablet Take 1 tablet (0.5 mg total) by mouth 2 (two) times daily. 60 tablet 2   clonazePAM (KLONOPIN) 0.5 MG tablet Take by mouth.     clonazePAM (KLONOPIN) 2 MG tablet Take by mouth.     diclofenac Sodium (VOLTAREN) 1 % GEL Apply 4 g topically 4 (four) times daily. 100 g 2   FLUoxetine (PROZAC) 10 MG capsule Take 1 capsule (10 mg total) by mouth daily. 30 capsule 2   FLUoxetine (PROZAC) 10 MG capsule Take by mouth.     FLUoxetine (PROZAC) 40 MG capsule Take 1 capsule (40 mg total) by mouth daily. 90 capsule 1   furosemide (LASIX) 80 MG tablet Take 80 mg by mouth daily.     gabapentin (NEURONTIN) 300 MG capsule TAKE 2 CAPSULES BY MOUTH 3 TIMES DAILY.  **NEED OFFICE VISIT FOR FURTHER REFILLS 90 capsule 0   ibuprofen (ADVIL)  200 MG tablet Take 400 mg by mouth every 6 (six) hours as needed for fever, headache or mild pain.     lactulose (CHRONULAC) 10 GM/15ML solution Take 30 mLs (20 g total) by mouth 3 (three) times daily. 236 mL 0   levETIRAcetam (KEPPRA) 750 MG tablet Take 1 tablet (750 mg total) by mouth 2 (two) times daily. 60 tablet 11    Objective: BP 134/89   Pulse (!) 123   Temp 98.9 F (37.2 C) (Oral)   Resp (!) 22   SpO2 95%  Exam: General: 50 year old female lying supine in bed, in NAD Eyes: Scleral icterus ENTM: MMM Cardiovascular: RRR, systolic murmur present Respiratory: CTAB, normal work of breathing Gastrointestinal: Distended abdomen due to ascites, normal bowel sounds, tenderness to palpation of right upper quadrant Extremities: 1+ pitting edema of bilateral BLEs, slight tremor of bilateral hands Derm: Jaundiced Neuro: Alert and oriented Psych: Mood and affect appropriate for situation  Labs and Imaging: CBC BMET  Recent Labs  Lab 06/25/21 1032  WBC 4.7  HGB 11.5*  HCT 33.9*  PLT 57*   Recent Labs  Lab 06/25/21 1032  NA 135  K 3.1*  CL 96*  CO2 23  BUN <5*  CREATININE 0.70  GLUCOSE 100*  CALCIUM 8.5*     EKG: My own interpretation (not copied from electronic read)    Precious Gilding, DO 06/25/2021, 7:03 PM PGY-1, [place] Intern pager: [phone], text pages welcome  FPTS Upper-Level Resident Addendum   I have independently interviewed and examined the patient. I have discussed the above with Dr. Ronnald Ramp and agree with the documented plan. My edits for correction/addition/clarification are included above. Please see any attending notes.   Alcus Dad, MD PGY-2, [place] 06/25/2021 7:49 PM  [place] Service pager: [phone] (text pages welcome through [place])

## 2021-06-25 NOTE — ED Provider Notes (Addendum)
[facility] EMERGENCY DEPARTMENT Provider Note   CSN: [id number] Arrival date & time: 06/25/21  8850     History Chief Complaint  Patient presents with   GI Bleeding   Abdominal Pain   Headache    Briana Sweeney is a 50 y.o. female history of alcohol abuse, drug abuse, liver disease with ascites, cirrhosis, upper GI bleed due to esophageal varices who presents with hematemesis, dark stools, abdominal pain, headache.  Patient reports that she was seen at [facility] on October 14, and they wanted to send her to [facility] for further evaluation, and to see gastroenterology.  Patient reports that she did not want to go to [facility], she came here for further evaluation.  She reports her headache came on gradually, is not associated with vision changes, difficulty speaking, no photophobia, phonophobia.  Patient reports she still drinks about a pint of vodka a day despite her cirrhosis, feels sick if she does not drink.   Abdominal Pain Headache Associated symptoms: abdominal pain       Past Medical History:  Diagnosis Date   Abscess of bursa of left elbow 09/23/2018   Acute hyperactive alcohol withdrawal delirium ([place]) 10/09/2017   Acute low back pain with bilateral sciatica    Acute metabolic encephalopathy 27/74/1287   Acute on chronic pancreatitis ([place]) 04/04/2021   AKI (acute kidney injury) ([place]) 04/04/2021   Alcohol withdrawal syndrome with complication ([place]) 86/76/7209   Alcohol-induced acute pancreatitis    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[place]) 12/05/2018   Ascites due to alcoholic cirrhosis ([place])    Bipolar affective disorder ([place])    With anxiety features   Bunionette of right foot 11/2019   Cirrhosis of liver ([place])    Due to alcohol and hepatitis C   Cocaine dependence without complication ([place]) 47/05/6282   Decompensated hepatic cirrhosis ([place]) 07/23/2017   Depression with anxiety    Epistaxis 01/20/2019   Erysipelas 09/13/2018    Esophageal 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 ([place])    Esophageal varices without bleeding (HCC)    ETOHism (HCC)    GERD (gastroesophageal reflux disease)    Head injury    Hematemesis 02/10/2018   Hepatitis C 2018   hepatitis c and alcohol related hepatitis   Heroin abuse ([place])    History of blood transfusion    "blood doesn't clot; I fell down and had to have a transfusion"   History of kidney stones    Menopause 2016   Migraine    "when I get really stressed" (09/01/2017)   Neuropathy 10/27/2018   Olecranon bursitis of left elbow    Other mixed anxiety disorders 10/27/2018   Overdose 08/22/2018   Pancytopenia ([place]) 07/23/2017   Polysubstance abuse ([place]) 04/08/2018   Portal hypertension ([place]) 03/18/2018   S/P foot surgery, right 11/2019   Schizophrenia ([place])    Seizures ([place])    "when I run out of my RX; lots recently" (09/01/2017)   Suicidal ideation    Thrombocytopenia ([place])    Trichimoniasis 10/30/2017   Upper GI bleed 02/10/2018   Urinary urgency 05/2020   UTI (urinary tract infection) 06/02/2017    Patient Active Problem List   Diagnosis Date Noted   Alcohol withdrawal ([place]) 06/26/2021   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[place]) 12/18/2020   Other mixed anxiety disorders 10/27/2018   Neuropathy 10/27/2018   Seizure ([place]) 08/22/2018   Substance induced mood disorder ([place]) 07/17/2018   Encephalopathy, portal systemic 04/08/2018   Hepatitis C 03/18/2018   Portal hypertension (  [place]) 03/18/2018   Depression 03/18/2018   Chronic anemia    Thrombocytopenia ([place]) 01/02/2018   GERD (gastroesophageal reflux disease) 11/01/2017   Alcohol use disorder, severe, dependence ([place]) 10/29/2017   Alcohol abuse with alcohol-induced mood disorder ([place])    Esophageal varices without bleeding ([place])    Hematemesis 09/01/2017   Coagulopathy ([place]) 29/51/8841   Alcoholic cirrhosis of liver with ascites and HCV infection 07/23/2017    Past Surgical History:  Procedure Laterality Date    BUNIONECTOMY     ESOPHAGOGASTRODUODENOSCOPY N/A 09/03/2017   Procedure: ESOPHAGOGASTRODUODENOSCOPY (EGD);  Surgeon: Doran Stabler, MD;  Location: [facility];  Service: Gastroenterology;  Laterality: N/A;   FINGER FRACTURE SURGERY Left    "shattered my pinky"   FRACTURE SURGERY     I & D EXTREMITY Left 09/18/2018   Procedure: IRRIGATION AND DEBRIDEMENT EXTREMITY;  Surgeon: Leanora Cover, MD;  Location: [facility];  Service: Orthopedics;  Laterality: Left;   IR PARACENTESIS  07/23/2017   IR PARACENTESIS  07/2017   "did it twice in the same week" (09/01/2017)   SHOULDER OPEN ROTATOR CUFF REPAIR Right    TOOTH EXTRACTION  08/2019   TUBAL LIGATION     VAGINAL HYSTERECTOMY       OB History     Gravida  3   Para      Term      Preterm      AB      Living  2      SAB      IAB      Ectopic      Multiple      Live Births              Family History  Problem Relation Age of Onset   Lung cancer Mother 35   Alcohol abuse Mother    Throat cancer Father 33    Social History   Tobacco Use   Smoking status: Never   Smokeless tobacco: Never  Vaping Use   Vaping Use: Never used  Substance Use Topics   Alcohol use: Not Currently    Alcohol/week: 6.0 standard drinks    Types: 6 Cans of beer per week    Comment: weekly   Drug use: Not Currently    Home Medications Prior to Admission medications   Medication Sig Start Date End Date Taking? Authorizing Provider  ARIPiprazole (ABILIFY) 10 MG tablet Take 1 tablet by mouth daily 02/15/21     ARIPiprazole (ABILIFY) 10 MG tablet Take 1 tablet (10 mg total) by mouth daily. 06/01/21     chlordiazePOXIDE (LIBRIUM) 25 MG capsule Take 2 tablets by mouth every 6 hours for 2 doses, then take 1 tablet by mouth every 6 hours for 2 doses 06/23/21     clonazePAM (KLONOPIN) 0.5 MG tablet Take 1 tablet by mouth twice daily. 05/04/21     clonazePAM (KLONOPIN) 0.5 MG tablet Take 1 tablet (0.5 mg total) by mouth 2 (two) times daily.  06/01/21     clonazePAM (KLONOPIN) 0.5 MG tablet Take by mouth. 06/01/21   [provider]  clonazePAM Bobbye Charleston) 2 MG tablet Take by mouth. 05/03/21   [provider]  diclofenac Sodium (VOLTAREN) 1 % GEL Apply 4 g topically 4 (four) times daily. 05/26/20   [name], FNP  FLUoxetine (PROZAC) 10 MG capsule Take 1 capsule (10 mg total) by mouth daily. 06/01/21     FLUoxetine (PROZAC) 10 MG capsule Take by mouth. 06/01/21  [provider]  FLUoxetine (PROZAC) 40 MG capsule Take 1 capsule (40 mg total) by mouth daily. 02/15/21     furosemide (LASIX) 80 MG tablet Take 80 mg by mouth daily. 03/07/21   [provider]  gabapentin (NEURONTIN) 300 MG capsule TAKE 2 CAPSULES BY MOUTH 3 TIMES DAILY.  **NEED OFFICE VISIT FOR FURTHER REFILLS** 04/16/21 04/16/22  Tresa Garter, MD  ibuprofen (ADVIL) 200 MG tablet Take 400 mg by mouth every 6 (six) hours as needed for fever, headache or mild pain.    [provider]  lactulose (CHRONULAC) 10 GM/15ML solution Take 30 mLs (20 g total) by mouth 3 (three) times daily. 04/08/21   Raiford Noble Latif, DO  levETIRAcetam (KEPPRA) 750 MG tablet Take 1 tablet (750 mg total) by mouth 2 (two) times daily. 01/30/21   Cameron Sprang, MD    Allergies    Other  Review of Systems   Review of Systems  Gastrointestinal:  Positive for abdominal pain.  Neurological:  Positive for headaches.  All other systems reviewed and are negative.  Physical Exam Updated Vital Signs BP (!) 99/59   Pulse (!) 114   Temp 98.9 F (37.2 C) (Oral)   Resp (!) 21   SpO2 92%   Physical Exam Vitals and nursing note reviewed.  Constitutional:      Appearance: Normal appearance. She is ill-appearing.     Comments: Ill-appearing jaundiced female sitting on bed in no acute distress  HENT:     Head: Normocephalic and atraumatic.  Eyes:     General: Scleral icterus present.        Right eye: No discharge.        Left eye: No discharge.   Cardiovascular:     Rate and Rhythm: Normal rate and regular rhythm.     Heart sounds: No murmur heard.   No friction rub. No gallop.  Pulmonary:     Effort: Pulmonary effort is normal.     Breath sounds: Normal breath sounds.  Abdominal:     General: Bowel sounds are normal.     Comments: Protuberant ascites filled abdomen somewhat warm to the touch, without rebound, rigidity, guarding, with tenderness to palpation throughout  Genitourinary:    Rectum: Guaiac result positive.     Comments: Rectum with several nonthrombosed external hemorrhoids, skin tags, stool is Hemoccult positive, no other masses palpated inside the rectal vault Skin:    General: Skin is warm and dry.     Capillary Refill: Capillary refill takes less than 2 seconds.  Neurological:     Mental Status: She is alert and oriented to person, place, and time.  Psychiatric:        Mood and Affect: Mood normal.        Behavior: Behavior normal.    ED Results / Procedures / Treatments   Labs (all labs ordered are listed, but only abnormal results are displayed) Labs Reviewed  CBC WITH DIFFERENTIAL/PLATELET - Abnormal; Notable for the following components:      Result Value   RBC 3.73 (*)    Hemoglobin 11.5 (*)    HCT 33.9 (*)    RDW 19.8 (*)    Platelets 57 (*)    Lymphs Abs 0.4 (*)    All other components within normal limits  COMPREHENSIVE METABOLIC PANEL - Abnormal; Notable for the following components:   Potassium 3.1 (*)    Chloride 96 (*)    Glucose, Bld 100 (*)    BUN <  5 (*)    Calcium 8.5 (*)    Albumin 2.9 (*)    AST 266 (*)    ALT 57 (*)    Alkaline Phosphatase 267 (*)    Total Bilirubin 8.0 (*)    Anion gap 16 (*)    All other components within normal limits  AMMONIA - Abnormal; Notable for the following components:   Ammonia 71 (*)    All other components within normal limits  URINALYSIS, ROUTINE W REFLEX MICROSCOPIC - Abnormal; Notable for the following components:   Color, Urine AMBER  (*)    APPearance HAZY (*)    Hgb urine dipstick SMALL (*)    Bilirubin Urine SMALL (*)    Ketones, ur 20 (*)    All other components within normal limits  LACTIC ACID, PLASMA - Abnormal; Notable for the following components:   Lactic Acid, Venous 2.8 (*)    All other components within normal limits  LACTIC ACID, PLASMA - Abnormal; Notable for the following components:   Lactic Acid, Venous 2.8 (*)    All other components within normal limits  LIPASE, BLOOD - Abnormal; Notable for the following components:   Lipase 63 (*)    All other components within normal limits  PROTIME-INR - Abnormal; Notable for the following components:   Prothrombin Time 15.5 (*)    All other components within normal limits  COMPREHENSIVE METABOLIC PANEL - Abnormal; Notable for the following components:   Sodium 132 (*)    Chloride 89 (*)    BUN <5 (*)    Calcium 7.0 (*)    Albumin 2.5 (*)    AST 230 (*)    ALT 49 (*)    Alkaline Phosphatase 219 (*)    Total Bilirubin 8.4 (*)    Anion gap 17 (*)    All other components within normal limits  CBC - Abnormal; Notable for the following components:   RBC 3.54 (*)    Hemoglobin 11.0 (*)    HCT 32.2 (*)    RDW 21.1 (*)    Platelets 51 (*)    All other components within normal limits  MAGNESIUM - Abnormal; Notable for the following components:   Magnesium 1.1 (*)    All other components within normal limits  PROTIME-INR - Abnormal; Notable for the following components:   Prothrombin Time 16.6 (*)    INR 1.3 (*)    All other components within normal limits  POC OCCULT BLOOD, ED - Abnormal; Notable for the following components:   Fecal Occult Bld POSITIVE (*)    All other components within normal limits  RESP PANEL BY RT-PCR (FLU A&B, COVID) ARPGX2  HCV RNA QUANT  TROPONIN I (HIGH SENSITIVITY)  TROPONIN I (HIGH SENSITIVITY)    EKG None  Radiology DG Chest 2 View  Result Date: 06/25/2021 CLINICAL DATA:  Chest pain EXAM: CHEST - 2 VIEW  COMPARISON:  Radiograph 06/22/2021 FINDINGS: Unchanged cardiomediastinal silhouette with calcified left hilar lymph nodes. There is no new focal airspace disease. There is no large pleural effusion or visible pneumothorax. Bilateral shoulder degenerative changes. IMPRESSION: No evidence of acute cardiopulmonary disease. Electronically Signed   By: Maurine Simmering M.D.   On: 06/25/2021 11:27    Procedures .Critical Care Performed by: Anselmo Pickler, PA-C Authorized by: Anselmo Pickler, PA-C   Critical care provider statement:    Critical care time (minutes):  36   Critical care was necessary to treat or prevent imminent or life-threatening deterioration of  the following conditions:  Hepatic failure (acute GI bleed)   Critical care was time spent personally by me on the following activities:  Development of treatment plan with patient or surrogate, ordering and performing treatments and interventions, ordering and review of laboratory studies, discussions with consultants, evaluation of patient's response to treatment, examination of patient, review of old charts and obtaining history from patient or surrogate   Care discussed with: admitting provider     Medications Ordered in ED Medications  octreotide (SANDOSTATIN) 2 mcg/mL load via infusion 50 mcg (50 mcg Intravenous Bolus from Bag 06/25/21 1843)    And  octreotide (SANDOSTATIN) 500 mcg in sodium chloride 0.9 % 250 mL (2 mcg/mL) infusion (50 mcg/hr Intravenous New Bag/Given 06/26/21 0516)  pantoprozole (PROTONIX) 80 mg /NS 100 mL infusion (8 mg/hr Intravenous New Bag/Given 06/26/21 0459)  pantoprazole (PROTONIX) injection 40 mg (has no administration in time range)  folic acid injection 1 mg (1 mg Intravenous Given 06/25/21 1855)  LORazepam (ATIVAN) tablet 1-4 mg ( Oral See Alternative 06/26/21 0518)    Or  LORazepam (ATIVAN) injection 1-4 mg (1 mg Intravenous Given 06/26/21 0518)  thiamine tablet 100 mg (100 mg Oral Given 06/25/21  1855)    Or  thiamine (B-1) injection 100 mg ( Intravenous See Alternative 06/25/21 1855)  multivitamin with minerals tablet 1 tablet (has no administration in time range)  cefTRIAXone (ROCEPHIN) 1 g in sodium chloride 0.9 % 100 mL IVPB (0 g Intravenous Stopped 06/25/21 2028)  chlordiazePOXIDE (LIBRIUM) capsule 25 mg (25 mg Oral Given 06/25/21 2220)    Followed by  chlordiazePOXIDE (LIBRIUM) capsule 25 mg (has no administration in time range)    Followed by  chlordiazePOXIDE (LIBRIUM) capsule 25 mg (has no administration in time range)    Followed by  chlordiazePOXIDE (LIBRIUM) capsule 25 mg (has no administration in time range)  furosemide (LASIX) tablet 80 mg (80 mg Oral Given 06/25/21 1953)  ARIPiprazole (ABILIFY) tablet 10 mg (10 mg Oral Given 06/25/21 2002)  FLUoxetine (PROZAC) capsule 40 mg (40 mg Oral Given 06/25/21 2002)  lactulose (CHRONULAC) 10 GM/15ML solution 20 g (20 g Oral Given 06/25/21 2221)  levETIRAcetam (KEPPRA) tablet 750 mg (750 mg Oral Given 06/25/21 2221)  acetaminophen (TYLENOL) tablet 325 mg (325 mg Oral Not Given 06/26/21 0441)  magnesium sulfate IVPB 4 g 100 mL (4 g Intravenous New Bag/Given 06/26/21 0814)  magnesium sulfate IVPB 2 g 50 mL (has no administration in time range)  lactulose (CHRONULAC) 10 GM/15ML solution 20 g (20 g Oral Given 06/25/21 1504)  ondansetron (ZOFRAN-ODT) disintegrating tablet 4 mg (4 mg Oral Given 06/25/21 1504)  morphine 2 MG/ML injection 2 mg (2 mg Intravenous Given 06/25/21 1504)  sodium chloride 0.9 % bolus 250 mL (0 mLs Intravenous Stopped 06/25/21 1534)  pantoprazole (PROTONIX) 80 mg /NS 100 mL IVPB (0 mg Intravenous Stopped 06/25/21 1905)    ED Course  I have reviewed the triage vital signs and the nursing notes.  Pertinent labs & imaging results that were available during my care of the patient were reviewed by me and considered in my medical decision making (see chart for details).  CT findings from [place]: 1. Advanced  hepatic steatosis and cirrhosis with evidence of portal hypertension. 2. Large primarily esophageal varices arising from a hypertrophic left coronary vein. 3. No evidence of gastro renal shunt or isolated gastric varices. 4. Moderately large sliding hiatal hernia. 5. Small volume ascites. 6. Cholelithiasis. 7. Sequelae of old granulomatous disease in the left hilum,  lung and spleen. 8. Additional ancillary findings as above.   MDM Rules/Calculators/A&P                         Is significant for elevated lactic acid, although lactic acid is stable over 2 values during her visit today.  Patient has significant abnormalities to her gastrointestinal labs including mildly elevated lipase, 3 bili of 8, elevated transaminases decreased albumin.  Patient with a mild hypokalemia.  Patient with increased ammonia, headache, brain fog per patient.  Patient with chronic thrombocytopenia for which she sees cancer center, stable compared to recent admission.  Patient with hemoglobin of 11.5 down one-point from recent admission at 12.6 which was 4 days ago.  Report of hematemesis.  Patient had plans to have been sent to [facility] for further evaluation by gastroenterology.  Patient eloped prior to transfer to [facility].  Patient now here for further evaluation of same.  CT findings from recent admission as shown above.  He did note large esophageal varices as well as advanced hepatic steatosis and cirrhosis with portal hypertension.  Spoke with GI who requests quantitative hepatitis C titer at this time, we will begin octreotide bolus and infusion, as well as protonix bolus and infusion.  Patient placed on clear liquid diet, n.p.o. after midnight.  GI will consult, does wish for admission to hospital service at this time.  Consult placed for admission at this time.  Spoke with hospitalist who accepts admission at this time.  Patient placed on CIWA protocol given history of alcohol abuse. Final  Clinical Impression(s) / ED Diagnoses Final diagnoses:  Alcoholic cirrhosis of liver with ascites ([place])  Secondary esophageal varices with bleeding [facility])    Rx / DC Orders ED Discharge Orders     None        Anselmo Pickler, PA-C 06/25/21 1724    Anselmo Pickler, PA-C 06/26/21 1245    Daleen Bo, MD 06/26/21 1954

## 2021-06-25 NOTE — ED Triage Notes (Signed)
EMS stated, She has upper and lower GI bleed, Went  to [place] on Oct, 14 stayed for 24 hours and wanted to be sent to [facility] and refused. Here today for the same reason. Sees Dr. Clovis Riley and she has quiet.

## 2021-06-25 NOTE — ED Provider Notes (Signed)
  Face-to-face evaluation   History: She presents for evaluation of stated vomiting and stooling blood, with abdominal pain, and dark stool.  She states she was at [facility], where she was held for 24 hours, then discharge yesterday.  She stated that they were trying to send her to see a gastroenterologist in [facility], but she decided to be discharged instead.  She comes here with these complaints for further evaluation.  She states she has never been to GI in [facility].  She follows with hematology for thrombocytosis and recently received a platelet transfusion.  Physical exam: Alert, calm, cooperative.  She is nontoxic in appearance.  Abdomen is somewhat distended but soft and nontender.  Medical screening examination/treatment/procedure(s) were conducted as a shared visit with non-physician practitioner(s) and myself.  I personally evaluated the patient during the encounter    Daleen Bo, MD 06/26/21 [phone]

## 2021-06-25 NOTE — ED Notes (Signed)
Pt was assisted to bedside toilet in the room. Pt finished and was assisted by ambulating back into bed. Privacy maintained no complaints noted.

## 2021-06-25 NOTE — ED Provider Notes (Signed)
Chest painEmergency Medicine Provider Triage Evaluation Note  Briana Sweeney , a 50 y.o. female  was evaluated in triage.  Pt complains of continued blood in vomit, dark stools, abdominal pain, splitting headache, feeling like her heart is racing.  Patient reports that she has cirrhosis, ascites, was recently seen at [facility] for her GI bleed, and was found to have low platelets, as well as they are following her hemoglobin, patient reports that they wanted to send the patient to [facility] for further evaluation, however that was too far away for her.  Patient also endorses nausea, some chest pain versus shortness of breath, feeling that her heart is racing, general malaise.  No dysuria, vaginal discharge, confusion, or syncope.  Review of Systems  Positive: As above Negative: As above  Physical Exam  BP 126/87 (BP Location: Left Arm)   Pulse (!) 123   Temp 98.9 F (37.2 C) (Oral)   Resp 16   SpO2 91%  Gen:   Awake, ill-appearing, jaundiced Resp:  Normal effort  MSK:   Moves extremities without difficulty  Other:  Distended ascitic abdomen, warm to the touch, without peritoneal signs or rigidity, minimal tenderness to palpation, no masses palpated.  Patient is jaundiced overall, scleral icterus.  Patient is tachycardic.  Medical Decision Making  Medically screening exam initiated at 10:26 AM.  Appropriate orders placed.  Briana Sweeney was informed that the remainder of the evaluation will be completed by another provider, this initial triage assessment does not replace that evaluation, and the importance of remaining in the ED until their evaluation is complete.  Multiple health problems, cirrhosis, GI bleed   Briana Sweeney 06/25/21 1030    Briana Bo, MD 06/25/21 2032

## 2021-06-26 ENCOUNTER — Inpatient Hospital Stay (HOSPITAL_COMMUNITY): Payer: Medicaid Other | Admitting: Certified Registered"

## 2021-06-26 ENCOUNTER — Encounter (HOSPITAL_COMMUNITY): Payer: Self-pay | Admitting: Family Medicine

## 2021-06-26 ENCOUNTER — Encounter: Payer: Self-pay | Admitting: Family Medicine

## 2021-06-26 ENCOUNTER — Encounter (HOSPITAL_COMMUNITY)
Admission: EM | Disposition: A | Discharge status: Home / Self Care | Payer: Self-pay | Source: Home / Self Care | Attending: Family Medicine

## 2021-06-26 ENCOUNTER — Inpatient Hospital Stay (HOSPITAL_COMMUNITY): Payer: Medicaid Other

## 2021-06-26 ENCOUNTER — Telehealth: Payer: Self-pay

## 2021-06-26 DIAGNOSIS — F10939 Alcohol use, unspecified with withdrawal, unspecified: Secondary | ICD-10-CM | POA: Diagnosis not present

## 2021-06-26 DIAGNOSIS — K766 Portal hypertension: Principal | ICD-10-CM

## 2021-06-26 DIAGNOSIS — I8510 Secondary esophageal varices without bleeding: Secondary | ICD-10-CM

## 2021-06-26 DIAGNOSIS — K7031 Alcoholic cirrhosis of liver with ascites: Secondary | ICD-10-CM | POA: Diagnosis not present

## 2021-06-26 HISTORY — PX: IR PARACENTESIS: IMG2679

## 2021-06-26 HISTORY — PX: ESOPHAGOGASTRODUODENOSCOPY (EGD) WITH PROPOFOL: SHX5813

## 2021-06-26 HISTORY — PX: ESOPHAGEAL BANDING: SHX5518

## 2021-06-26 LAB — BODY FLUID CELL COUNT WITH DIFFERENTIAL
Eos, Fluid: 5 %
Lymphs, Fluid: 72 %
Monocyte-Macrophage-Serous Fluid: 16 % — ABNORMAL LOW (ref 50–90)
Neutrophil Count, Fluid: 7 % (ref 0–25)
Total Nucleated Cell Count, Fluid: 81 cu mm (ref 0–1000)

## 2021-06-26 LAB — CBC
HCT: 32.2 % — ABNORMAL LOW (ref 36.0–46.0)
Hemoglobin: 11 g/dL — ABNORMAL LOW (ref 12.0–15.0)
MCH: 31.1 pg (ref 26.0–34.0)
MCHC: 34.2 g/dL (ref 30.0–36.0)
MCV: 91 fL (ref 80.0–100.0)
Platelets: 51 10*3/uL — ABNORMAL LOW (ref 150–400)
RBC: 3.54 MIL/uL — ABNORMAL LOW (ref 3.87–5.11)
RDW: 21.1 % — ABNORMAL HIGH (ref 11.5–15.5)
WBC: 4.1 10*3/uL (ref 4.0–10.5)
nRBC: 0 % (ref 0.0–0.2)

## 2021-06-26 LAB — COMPREHENSIVE METABOLIC PANEL
ALT: 49 U/L — ABNORMAL HIGH (ref 0–44)
AST: 230 U/L — ABNORMAL HIGH (ref 15–41)
Albumin: 2.5 g/dL — ABNORMAL LOW (ref 3.5–5.0)
Alkaline Phosphatase: 219 U/L — ABNORMAL HIGH (ref 38–126)
Anion gap: 17 — ABNORMAL HIGH (ref 5–15)
BUN: <5 mg/dL — ABNORMAL LOW (ref 6–20)
CO2: 26 mmol/L (ref 22–32)
Calcium: 7 mg/dL — ABNORMAL LOW (ref 8.9–10.3)
Chloride: 89 mmol/L — ABNORMAL LOW (ref 98–111)
Creatinine, Ser: 0.79 mg/dL (ref 0.44–1.00)
GFR, Estimated: >60 mL/min (ref >60)
Glucose, Bld: 91 mg/dL (ref 70–99)
Potassium: 4.1 mmol/L (ref 3.5–5.1)
Sodium: 132 mmol/L — ABNORMAL LOW (ref 135–145)
Total Bilirubin: 8.4 mg/dL — ABNORMAL HIGH (ref 0.3–1.2)
Total Protein: 6.5 g/dL (ref 6.5–8.1)

## 2021-06-26 LAB — LACTIC ACID, PLASMA: Lactic Acid, Venous: 1.1 mmol/L (ref 0.5–1.9)

## 2021-06-26 LAB — PROTIME-INR
INR: 1.3 — ABNORMAL HIGH (ref 0.8–1.2)
Prothrombin Time: 16.6 seconds — ABNORMAL HIGH (ref 11.4–15.2)

## 2021-06-26 LAB — MAGNESIUM: Magnesium: 1.1 mg/dL — ABNORMAL LOW (ref 1.7–2.4)

## 2021-06-26 SURGERY — ESOPHAGOGASTRODUODENOSCOPY (EGD) WITH PROPOFOL
Anesthesia: Monitor Anesthesia Care

## 2021-06-26 MED ORDER — LIDOCAINE HCL 1 % IJ SOLN
INTRAMUSCULAR | Status: AC
  Filled 2021-06-26: qty 20

## 2021-06-26 MED ORDER — OXYCODONE HCL 5 MG/5ML PO SOLN: 5.0000 mg | Freq: Once | ORAL | Status: DC | PRN

## 2021-06-26 MED ORDER — PROMETHAZINE HCL 25 MG/ML IJ SOLN: 6.2500 mg | Freq: Once | INTRAMUSCULAR | Status: DC

## 2021-06-26 MED ORDER — SUCCINYLCHOLINE CHLORIDE 200 MG/10ML IV SOSY
PREFILLED_SYRINGE | INTRAVENOUS | Status: DC | PRN
  Administered 2021-06-26: 120 mg via INTRAVENOUS

## 2021-06-26 MED ORDER — PHENYLEPHRINE 40 MCG/ML (10ML) SYRINGE FOR IV PUSH (FOR BLOOD PRESSURE SUPPORT)
PREFILLED_SYRINGE | INTRAVENOUS | Status: DC | PRN
  Administered 2021-06-26 (×3): 80 ug via INTRAVENOUS

## 2021-06-26 MED ORDER — ONDANSETRON HCL 4 MG/2ML IJ SOLN
INTRAMUSCULAR | Status: DC | PRN
  Administered 2021-06-26: 4 mg via INTRAVENOUS

## 2021-06-26 MED ORDER — PROMETHAZINE HCL 25 MG/ML IJ SOLN: 6.2500 mg | INTRAMUSCULAR | Status: DC | PRN

## 2021-06-26 MED ORDER — LIDOCAINE 2% (20 MG/ML) 5 ML SYRINGE
INTRAMUSCULAR | Status: DC | PRN
  Administered 2021-06-26: 40 mg via INTRAVENOUS

## 2021-06-26 MED ORDER — MAGNESIUM SULFATE 2 GM/50ML IV SOLN
2.0000 g | Freq: Every day | INTRAVENOUS | Status: DC
  Administered 2021-06-27 – 2021-06-28 (×2): 2 g via INTRAVENOUS
  Filled 2021-06-26 (×3): qty 50

## 2021-06-26 MED ORDER — PROMETHAZINE HCL 25 MG/ML IJ SOLN
INTRAMUSCULAR | Status: AC
  Filled 2021-06-26: qty 1

## 2021-06-26 MED ORDER — ACETAMINOPHEN 325 MG PO TABS: 325.0000 mg | ORAL_TABLET | Freq: Once | ORAL | Status: DC

## 2021-06-26 MED ORDER — PROPOFOL 10 MG/ML IV BOLUS
INTRAVENOUS | Status: DC | PRN
  Administered 2021-06-26: 140 mg via INTRAVENOUS

## 2021-06-26 MED ORDER — MAGNESIUM SULFATE 4 GM/100ML IV SOLN
4.0000 g | Freq: Once | INTRAVENOUS | Status: AC
  Administered 2021-06-26: 4 g via INTRAVENOUS
  Filled 2021-06-26: qty 100

## 2021-06-26 MED ORDER — LACTATED RINGERS IV SOLN: INTRAVENOUS | Status: DC | PRN

## 2021-06-26 MED ORDER — LIDOCAINE HCL 1 % IJ SOLN
INTRAMUSCULAR | Status: DC | PRN
  Administered 2021-06-26: 10 mL

## 2021-06-26 MED ORDER — FENTANYL CITRATE (PF) 100 MCG/2ML IJ SOLN: 25.0000 ug | INTRAMUSCULAR | Status: DC | PRN

## 2021-06-26 MED ORDER — OXYCODONE HCL 5 MG PO TABS: 5.0000 mg | ORAL_TABLET | Freq: Once | ORAL | Status: DC | PRN

## 2021-06-26 SURGICAL SUPPLY — 14 items

## 2021-06-26 NOTE — Transfer of Care (Signed)
Immediate Anesthesia Transfer of Care Note  Patient: Briana Sweeney  Procedure(s) Performed: ESOPHAGOGASTRODUODENOSCOPY (EGD) WITH PROPOFOL ESOPHAGEAL BANDING  Patient Location: PACU  Anesthesia Type:General  Level of Consciousness: awake, alert , oriented and patient cooperative  Airway & Oxygen Therapy: Patient Spontanous Breathing and Patient connected to nasal cannula oxygen  Post-op Assessment: Report given to RN, Post -op Vital signs reviewed and stable and Patient moving all extremities X 4  Post vital signs: Reviewed and stable  Last Vitals:  Vitals Value Taken Time  BP 119/79 06/26/21 1106  Temp    Pulse 104 06/26/21 1110  Resp 16 06/26/21 1110  SpO2 96 % 06/26/21 1110  Vitals shown include unvalidated device data.  Last Pain:  Vitals:   06/26/21 1020  TempSrc: Temporal  PainSc: 0-No pain         Complications: No notable events documented.

## 2021-06-26 NOTE — Anesthesia Preprocedure Evaluation (Signed)
Anesthesia Evaluation  Patient identified by MRN, date of birth, ID band Patient awake    Reviewed: Allergy & Precautions, NPO status , Patient's Chart, lab work & pertinent test results  History of Anesthesia Complications Negative for: history of anesthetic complications  Airway Mallampati: III  TM Distance: >3 FB Neck ROM: Full  Mouth opening: Limited Mouth Opening  Dental  (+) Dental Advisory Given, Teeth Intact   Pulmonary neg pulmonary ROS,    breath sounds clear to auscultation       Cardiovascular negative cardio ROS   Rhythm:Regular Rate:Tachycardia     Neuro/Psych  Headaches, Seizures -,  PSYCHIATRIC DISORDERS Anxiety Depression Bipolar Disorder Schizophrenia  Neuromuscular disease    GI/Hepatic GERD  ,(+)     substance abuse  alcohol use, Hepatitis -, C? GI bleed   Endo/Other    Renal/GU Renal disease     Musculoskeletal negative musculoskeletal ROS (+)   Abdominal   Peds  Hematology  (+) Blood dyscrasia, anemia , Thrombocytopenia  Lab Results      Component                Value               Date                      WBC                      4.1                 06/26/2021                HGB                      11.0 (L)            06/26/2021                HCT                      32.2 (L)            06/26/2021                MCV                      91.0                06/26/2021                PLT                      51 (L)              06/26/2021              Anesthesia Other Findings   Reproductive/Obstetrics                             Anesthesia Physical Anesthesia Plan  ASA: 3  Anesthesia Plan: General   Post-op Pain Management:    Induction: Intravenous and Rapid sequence  PONV Risk Score and Plan: 3 and Ondansetron and Dexamethasone  Airway Management Planned: Oral ETT  Additional Equipment: None  Intra-op Plan:   Post-operative Plan:  Extubation in OR  Informed Consent: I have reviewed the patients History and Physical, chart, labs and discussed  the procedure including the risks, benefits and alternatives for the proposed anesthesia with the patient or authorized representative who has indicated his/her understanding and acceptance.     Dental advisory given  Plan Discussed with: CRNA and Anesthesiologist  Anesthesia Plan Comments:         Anesthesia Quick Evaluation

## 2021-06-26 NOTE — ED Notes (Signed)
Pt was assisted to bedside toilet. Privacy was maintained. Pt was assisted back into bed once she was finished. No complaints noted.

## 2021-06-26 NOTE — Progress Notes (Addendum)
FPTS Brief Progress Note  S:Pt denies further vomiting or blood in stool. States she has a "itchy" headache. Reports a fall a few weeks ago and had to get scalp sutures. Says she has had a headache since they took the sutures out.    O: BP (!) 133/94   Pulse (!) 121   Temp 98.9 F (37.2 C) (Oral)   Resp (!) 23   SpO2 91%    HENT: no suture scars visible in the area pt reports, non-tender to palpation  RESP: no pausing with conversation, mild tachypnea   A/P: Headache Unable to locate recent CT Head. On chart review, there are notes regarding right sided head laceration with 2 sutures in May of 2020.  - Low dose Tylenol 325 mg given  - Consider CT Head if headache persists.   CIWA 8>9>4 on Librium taper. Given x1 1 mg Ativan at [place].  - Orders reviewed. Labs for AM ordered, which was adjusted as needed.    Lyndee Hensen, DO 06/26/2021, 2:18 AM PGY-3, [facility] Night Resident  Please page [phone] with questions.

## 2021-06-26 NOTE — ED Notes (Signed)
Pt to endo. 

## 2021-06-26 NOTE — ED Notes (Signed)
Holding meds for NPO  status pt going to endo

## 2021-06-26 NOTE — Procedures (Signed)
Ultrasound-guided diagnostic and therapeutic paracentesis performed yielding 1.1 liters of straw colored fluid.  Fluid was sent to lab for analysis. No immediate complications. EBL is none.

## 2021-06-26 NOTE — Telephone Encounter (Signed)
Dr [name], Verner Chol for Dr [name], and Lynnae Sandhoff notified of hospital admission.

## 2021-06-26 NOTE — Op Note (Signed)
[facility] Patient Name: Briana Sweeney Procedure Date : 06/26/2021 MRN: [id number] Attending MD: Jackquline Denmark , MD Date of Birth: 1970-12-13 CSN: [id number] Age: 50 Admit Type: Emergency Department Procedure:                Upper GI endoscopy Indications:              Hematemesis in pt with known liver cirrhosis with                            portal hypertension and esophageal varices. Ongoing                            alcohol abuse. Providers:                Jackquline Denmark, MD, Vista Lawman, RN, Hinton Dyer,                            Phill Myron. Proofreader, CRNA Referring MD:              Medicines:                General Anesthesia Complications:            No immediate complications. Estimated Blood Loss:     Estimated blood loss: none. Procedure:                Pre-Anesthesia Assessment:                           - Prior to the procedure, a History and Physical                            was performed, and patient medications and                            allergies were reviewed. The patient's tolerance of                            previous anesthesia was also reviewed. The risks                            and benefits of the procedure and the sedation                            options and risks were discussed with the patient.                            All questions were answered, and informed consent                            was obtained. Prior Anticoagulants: The patient has                            taken no previous anticoagulant or antiplatelet  agents. ASA Grade Assessment: IV - A patient with                            severe systemic disease that is a constant threat                            to life. After reviewing the risks and benefits,                            the patient was deemed in satisfactory condition to                            undergo the procedure.                           After obtaining informed  consent, the endoscope was                            passed under direct vision. Throughout the                            procedure, the patient's blood pressure, pulse, and                            oxygen saturations were monitored continuously. The                            [id number]-H190 ([id number]) Olympus endoscope was introduced                            through the mouth, and advanced to the second part                            of duodenum. The upper GI endoscopy was                            accomplished without difficulty. The patient                            tolerated the procedure well. Scope In: Scope Out: Findings:      Grade III, large (> 5 mm) varices were found in the mid esophagus and in       the distal esophagus. One channel with red wale marking at [employer]       s/o recent bleeding. They were large in size. Six bands were       successfully placed with partial eradication, resulting in deflation of       varices. There was no bleeding during and at the end of the procedure.      Mild portal hypertensive gastropathy was found in the entire examined       stomach. No fundal varices.      Diffuse mildly congested mucosa without active bleeding and with no       stigmata of bleeding was found in the entire duodenum c/w portal  hypertensive duodenopathy. Impression:               - Grade III and large (> 5 mm) esophageal varices                            s/p EVL x 6.                           - Mild portal hypertensive gastropathy and                            duodenopathy.                           - No specimens collected. Recommendation:           - Return patient to hospital ward for ongoing care.                           - Clear liquid diet today                           - Continue IV octreotide/Protonix/Rocephin                           - Trend CBC                           - Stop ETOH                           - [place] abdo with paracenteses (if  tappable ascites)                            to r/o SBP                           - Repeat upper endoscopy in 4-6 weeks for                            retreatment.                           - The findings and recommendations were discussed                            with the patient. Procedure Code(s):        --- Professional ---                           [phone], Esophagogastroduodenoscopy, flexible,                            transoral; with band ligation of esophageal/gastric                            varices Diagnosis Code(s):        --- Professional ---  I85.00, Esophageal varices without bleeding                           K76.6, Portal hypertension                           K31.89, Other diseases of stomach and duodenum                           K92.0, Hematemesis CPT copyright 2019 American Medical Association. All rights reserved. The codes documented in this report are preliminary and upon coder review may  be revised to meet current compliance requirements. Jackquline Denmark, MD 06/26/2021 11:12:16 AM This report has been signed electronically. Number of Addenda: 0

## 2021-06-26 NOTE — ED Notes (Signed)
Admitting MD  at beside aware of lower level o2 readings pt agreed to leave o2 on

## 2021-06-26 NOTE — Progress Notes (Addendum)
Family Medicine Teaching Service Daily Progress Note Intern Pager: [phone]  Patient name: Briana Sweeney Medical record number: [id number] Date of birth: 1971-02-07 Age: 50 y.o. Gender: female  Primary Care Provider: [name], FNP (Inactive) Consultants: GI Code Status: Full  Pt Overview and Major Events to Date:  06/25/2021-admitted 06/26/2021-upper GI endoscopy, 6 bands placed  Assessment and Plan: Briana Sweeney is a 50 year old female presenting with hematemesis, melena, hematochezia.  PMH is significant for alcohol use disorder, cirrhosis, ascites, seizure disorder, and bipolar/schizophrenia.  Esophageal varices Patient had upper GI endoscopy on 06/23/2021 and was found to have grade 3 and large esophageal varices.  6 bands were placed.  She was also found to have mild portal hypertensive gastropathy and duodenopathy. This is day 3 of pharmacologic therapy for varices. GI recommends patient have repeat upper endoscopy in 4 to 6 weeks.  Hemoglobin stable at 11 today.  -GI consulted, appreciate recs -IV octreotide 25 mL/h -IV pantoprazole 10 mL/h for total of 72 hours, started on 06/25/2021 -IV pantoprazole 40 mg twice daily starting 06/29/2021 -AM CBC -A.m. CMP  Alcoholic cirrhosis  ascites Home medications include lactulose and Lasix.  Has palmar erythema, appropriate mentation, abdominal distention due to ascites is less s/p ultrasound-guided diagnostic and therapeutic paracentesis which yielded 1.1 L of straw-colored fluid which had neutrophils, lymphocytes WNL.  Patient had potassium of 2.2 this morning.  Magnesium of 1.6, up from 1.1 yesterday. -Ceftriaxone 1 g daily for SBP prophylaxis  -Continue home lactulose 20 g 3 times daily -D/c daily lasix  -Add spironolactone 50 mg dialy -oral 21mEq potassium chloride x2 -4 runs of 10 mEq IV potassium chloride -Recheck potassium this afternoon -Consider adding spironolactone at discharge -Magnesium 2 g IV  daily  Alcohol abuse Patient has 10-year history of consuming 1 pint of vodka daily.  Last drink was 06/24/2021.  Last received as needed Ativan this morning.  Currently has very slight bilateral hand tremor and is tachycardic to 106. Pt has had O2 saturations in high 80's to low 90's and has been on oxygen via [place] on and off. It is possible that benzos are contributing to this as pt seems drowsy and O2 saturation improves with deep breaths upon prompting. Will d/c ativan.   -CIWA monitoring  -Librium taper -Thiamine 100 mg oral or IV daily -Folic acid 1 mg daily -Multivitamin 1 tablet daily  Headache Patient has had constant stabbing headache behind her eyes since yesterday.  He is requesting medication this morning -Tylenol 650 mg once, can repeat for a total of 3 times today if needed  Chronic thrombocytopenia Platelets 69 today, up from 51 yesterday.  Patient receives Nplate infusions weekly through [facility].  -Daily CBC  Depression  schizophrenia  bipolar disorder -Continue home Abilify 10 mg daily and Prozac 40 mg daily  Seizure disorder Patient states she has had a seizure disorder since age 64 and has also had seizures due to alcohol withdrawal in the past.  Current home medications include Keppra 750 mg twice daily -Continue Keppra -Management of alcohol use disorder/withdrawal as above  History of hepatitis C -HCV RNA quant in process   FEN/GI: Clear liquids PPx: SCDs Dispo:Home in 2-3 days. Barriers include continued medical management.   Subjective:  Patient states she still has a constant and stabbing headache today behind her right eye.  Requesting pain medication.  Patient states after her upper endoscopy yesterday her throat/chest is a little sore and burning.  Hurts to swallow.  Slightly better today than  yesterday.  She was able to eat some broth last night.  She vomited clear fluid once this morning but then was able to eat some broth with no nausea or vomiting.   States she had 2 bowel movements yesterday and one small bowel movement this morning.  Objective: Temp:  [97.8 F (36.6 C)-98 F (36.7 C)] 98 F (36.7 C) (10/18 1135) Pulse Rate:  [100-129] 108 (10/18 1217) Resp:  [14-28] 18 (10/18 1217) BP: (87-143)/(54-107) 124/75 (10/18 1217) SpO2:  [87 %-98 %] 96 % (10/18 1217) Physical Exam: General: Patient lying in bed, NAD Cardiovascular: Regular rhythm, tachycardic, systolic murmur present Respiratory: CTAB, normal work of breathing, oxygen saturation of 91% on room air Abdomen: Mild diffuse tenderness to palpation, worse in the right and left upper quadrants, mildly distended, normal bowel sounds Extremities: 1+ pitting edema of BLEs  Laboratory: Recent Labs  Lab 06/21/21 0000 06/25/21 1032 06/26/21 0459  WBC 2.3 4.7 4.1  HGB 12.6 11.5* 11.0*  HCT 39 33.9* 32.2*  PLT 55* 57* 51*   Recent Labs  Lab 06/21/21 0000 06/25/21 1032 06/26/21 0459  NA 145 135 132*  K 3.6 3.1* 4.1  CL 107 96* 89*  CO2 22 23 26   BUN 4 <5* <5*  CREATININE 0.6 0.70 0.79  CALCIUM 7.6* 8.5* 7.0*  PROT  --  7.3 6.5  BILITOT  --  8.0* 8.4*  ALKPHOS 380* 267* 219*  ALT 66* 57* 49*  AST 299* 266* 230*  GLUCOSE  --  100* 91      Precious Gilding, DO 06/26/2021, 1:44 PM PGY-1, [place] Intern pager: [phone], text pages welcome

## 2021-06-26 NOTE — Hospital Course (Addendum)
Briana Sweeney is a 50 year old female presenting with hematemesis, melena, hematochezia.  PMH is significant for alcohol use disorder, cirrhosis, ascites, seizure disorder, and bipolar/schizophrenia  Esophageal varices  alcoholic cirrhosis  ascites Patient first presented to [facility] ED on 06/22/2021 due to hematemesis, melena, and hematochezia. CT of abdomen pelvis from 06/22/2021 showed advanced hepatic steatosis and cirrhosis with evidence of portal hypertension, large esophageal varices, and small volume ascites. Patient was discharged from [facility] ED on 06/23/2021 as they do not have a GI service and came to [facility] ED to have further evaluation.   In [facility] ED, patient was tachycardic but remainder of vitals stable. Hgb 11.6 which is unchanged from prior value on 10/14 per review of records from [facility]. In the ED she had a positive FOBT, lactic acid of 2.8, ALT 57, AST of 266, alk phos of 267, total bili of 8, albumin of 2.9, INR of 1.2, PTT of 15.5, ammonia of 71.   Patient had significant abdominal ascites, palmar erythema, and bilateral lower pitting extremity edema.  Patient was treated with IV octreotide and IV pantoprazole for esophageal varices and with IV ceftriaxone for SBP prophylaxis.    She had upper GI endoscopy on 06/23/2021 and was found to have grade 3 and large esophageal varices.  6 bands were placed.  She was also found to have mild portal hypertensive gastropathy and duodenopathy.  Patient completed 72 hours of IV octreotide.  Patient was transitioned to PO Augmentin and ciprofloxacin which she completed prior to discharge.  She was discharged on pantoprazole 40 mg daily. GI recommends follow-up in 3 to 4 weeks repeat upper endoscopy in 4 to 6 weeks.   Patient had diagnostic and therapeutic paracentesis on 06/26/2021 resulting in 1.1 L of straw-colored few fluid with no evidence of SBP.  For her chronic cirrhosis and ascites, spironolactone 100 mg was added to her home  regimen and her Lasix was decreased to 40 mg daily.  She remained on her home lactulose 20 mg 3 times daily.  Her electrolytes were monitored and repleted as necessary.  Alcohol withdrawal Patient states she has 10-year history of drinking 1 pint of vodka per day.  Patient was treated with Librium taper and as needed Ativan for withdrawal symptoms.  Patient was discharged with naltrexone.  Chronic thrombocytopenia Platelets trended from 57 at admission to 95 on day of discharge.  Patient receives Nplate infusions weekly through [facility] and would have had an infusion on 06/27/2021 per patient.  She did not receive any infusions during her stay.  Headache Patient had a sharp constant headache behind her as during her stay.  Patient had a head CT which showed no acute intracranial hemorrhage and no acute intracranial process.  Headache slightly improved upon discharge  Issues for follow up: Follow-up on CBC, CMP, and magnesium within 1 week from discharge Follow-up with oncologist for Nplate infusions Ensure follow-up with GI in 3 to 4 weeks after discharge

## 2021-06-26 NOTE — Plan of Care (Signed)

## 2021-06-26 NOTE — Consult Note (Addendum)
[facility] Consult: 10:03 AM 06/26/2021  LOS: 1 day    Referring Provider: Dr Billee Cashing Diarmid  Primary Care Physician:  [name], FNP (Inactive) Primary Gastroenterologist:  unassigned    Reason for Consultation:  hematemesis   HPI: Briana Sweeney is a 50 y.o. female.  PMH complications of alcoholism and treated hepatitis C including cirrhosis, ascites, thrombocytopenia.  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  Seizure disorder.  Schizophrenia, bipolar.  See extensive list below. HCV quantitative PCR undetectable in 01/2020 Receiving weekly Nplate infusions for thrombocytopenia at [facility] center.  She received platelet transfusions in April 2021 07/2017 paracentesis.  0.7 liters removed.  Fluid negative for SBP 08/2017  EGD by Dr. Loletha Carrow for hematemesis.  There were grade 2, nonbleeding gastric varices, portal hypertensive gastropathy and otherwise normal study.  No gastric varices.  No interventions performed. 03/2021 CTAP w/O contrast for flank pain: Cirrhosis, stigmata of portal venous hypertension including paraesophageal and abdominal varices, splenomegaly.  No ascites.  Chronic calcific pancreatitis.  About 10 days ago patient was treated in the local ED after falling and sustaining a scalp laceration, sutures subsequently removed. Patient still drinking anywhere from 16 to 32 ounces of vodka daily.  Has frequent nausea but rarely vomits.  Not taking any NSAIDs or aspirin products or gastric acid suppressing meds.  Last Wednesday, 6 days ago, developed acute onset hematemesis as well as melenic stools.  Several episodes of hematemesis overnight but it calm down to a rate of once daily since then.  Last episode was yesterday morning, 10/17.  She reports frequent  nosebleeds.  Has nosebleeding 15 out of 30 days in a month.  Her platelets were as low as 13 in mid September and more recently in the mid 84s. She was seen in the [facility] ED on 10/14 for evaluation.  CT scan there showed cirrhosis, changes of portal hypertension with large esophageal varices, small amount of ascites.  No GI coverage available.  They were working on getting her transferred to [facility] but patient did not want to go there so she left the hospital the next day.  Her Hgb was 11.6.  Lipase was 63. Presented to [facility] ED yesterday morning for evaluation.  Hgb 11.5 >> 11.  Hgb averaging 12.5 within the last 3 weeks.  MCV 90.  Platelets 57 K.  INR 1.3. T bili 8.4.  Alk phos 267.  AST/ALT 266/57.  Lipase 63.  Sodium 132.  FOBT +.   Vital signs with tachycardia into the 120s but primarily in the 1 teens.  Current BPs 90s/50s.  Oxygen sats on 2 L nasal cannula are anywhere from 88 to 95%.  Initiated on octreotide, PPI drips, Rocephin in place.  Patient lives alone with her dog.  No family history of liver disease, blood disorders GI cancers, ulcer disease.  Her mother died with lung cancer, she was a smoker.  She mentions her neighbor as her social support should she require assistance.    Past Medical History:  Diagnosis Date   Abscess of  bursa of left elbow 09/23/2018   Acute hyperactive alcohol withdrawal delirium ([place]) 10/09/2017   Acute low back pain with bilateral sciatica    Acute metabolic encephalopathy 62/26/3335   Acute on chronic pancreatitis ([place]) 04/04/2021   AKI (acute kidney injury) ([place]) 04/04/2021   Alcohol withdrawal syndrome with complication ([place]) 45/62/5638   Alcohol-induced acute pancreatitis    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[place]) 12/05/2018   Ascites due to alcoholic cirrhosis ([place])    Bipolar affective disorder ([place])    With anxiety features   Bunionette of right foot 11/2019   Cirrhosis of liver ([place])    Due to alcohol and hepatitis C   Cocaine dependence  without complication ([place]) 93/73/4287   Decompensated hepatic cirrhosis ([place]) 07/23/2017   Depression with anxiety    Epistaxis 01/20/2019   Erysipelas 09/13/2018   Esophageal 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 (HCC)    Esophageal varices without bleeding (HCC)    ETOHism (HCC)    GERD (gastroesophageal reflux disease)    Head injury    Hematemesis 02/10/2018   Hepatitis C 2018   hepatitis c and alcohol related hepatitis   Heroin abuse ([place])    History of blood transfusion    "blood doesn't clot; I fell down and had to have a transfusion"   History of kidney stones    Menopause 2016   Migraine    "when I get really stressed" (09/01/2017)   Neuropathy 10/27/2018   Olecranon bursitis of left elbow    Other mixed anxiety disorders 10/27/2018   Overdose 08/22/2018   Pancytopenia ([place]) 07/23/2017   Polysubstance abuse ([place]) 04/08/2018   Portal hypertension ([place]) 03/18/2018   S/P foot surgery, right 11/2019   Schizophrenia ([place])    Seizures ([place])    "when I run out of my RX; lots recently" (09/01/2017)   Suicidal ideation    Thrombocytopenia ([place])    Trichimoniasis 10/30/2017   Upper GI bleed 02/10/2018   Urinary urgency 05/2020   UTI (urinary tract infection) 06/02/2017    Past Surgical History:  Procedure Laterality Date   BUNIONECTOMY     ESOPHAGOGASTRODUODENOSCOPY N/A 09/03/2017   Procedure: ESOPHAGOGASTRODUODENOSCOPY (EGD);  Surgeon: Doran Stabler, MD;  Location: [place];  Service: Gastroenterology;  Laterality: N/A;   FINGER FRACTURE SURGERY Left    "shattered my pinky"   FRACTURE SURGERY     I & D EXTREMITY Left 09/18/2018   Procedure: IRRIGATION AND DEBRIDEMENT EXTREMITY;  Surgeon: Leanora Cover, MD;  Location: [facility];  Service: Orthopedics;  Laterality: Left;   IR PARACENTESIS  07/23/2017   IR PARACENTESIS  07/2017   "did it twice in the same week" (09/01/2017)   SHOULDER OPEN ROTATOR CUFF REPAIR Right    TOOTH EXTRACTION  08/2019   TUBAL LIGATION     VAGINAL  HYSTERECTOMY      Prior to Admission medications   Medication Sig Start Date End Date Taking? Authorizing Provider  ARIPiprazole (ABILIFY) 10 MG tablet Take 1 tablet (10 mg total) by mouth daily. 06/01/21  Yes   clonazePAM (KLONOPIN) 0.5 MG tablet Take 1 tablet by mouth twice daily. 05/04/21  Yes   clonazePAM (KLONOPIN) 0.5 MG tablet Take 1 tablet (0.5 mg total) by mouth 2 (two) times daily. 06/01/21  Yes   diclofenac Sodium (VOLTAREN) 1 % GEL Apply 4 g topically 4 (four) times daily. Patient taking differently: Apply 4 g topically 4 (four) times daily as needed (foot pain). 05/26/20  Yes [name], FNP  FLUoxetine (PROZAC) 40 MG capsule  Take 1 capsule (40 mg total) by mouth daily. 02/15/21  Yes   furosemide (LASIX) 80 MG tablet Take 80 mg by mouth daily. 03/07/21  Yes [provider]  gabapentin (NEURONTIN) 300 MG capsule TAKE 2 CAPSULES BY MOUTH 3 TIMES DAILY.  **NEED OFFICE VISIT FOR FURTHER REFILLS 04/16/21 04/16/22 Yes Tresa Garter, MD  lactulose (CHRONULAC) 10 GM/15ML solution Take 30 mLs (20 g total) by mouth 3 (three) times daily. 04/08/21  Yes Sheikh, Omair Latif, DO  levETIRAcetam (KEPPRA) 750 MG tablet Take 1 tablet (750 mg total) by mouth 2 (two) times daily. 01/30/21  Yes Cameron Sprang, MD  ARIPiprazole (ABILIFY) 10 MG tablet Take 1 tablet by mouth daily Patient not taking: Reported on 06/25/2021 02/15/21     chlordiazePOXIDE (LIBRIUM) 25 MG capsule Take 2 tablets by mouth every 6 hours for 2 doses, then take 1 tablet by mouth every 6 hours for 2 doses 06/23/21     FLUoxetine (PROZAC) 10 MG capsule Take 1 capsule (10 mg total) by mouth daily. Patient not taking: No sig reported 06/01/21       Scheduled Meds:  acetaminophen  325 mg Oral Once   ARIPiprazole  10 mg Oral Daily   chlordiazePOXIDE  25 mg Oral QID   Followed by   chlordiazePOXIDE  25 mg Oral TID   Followed by   Derrill Memo ON 06/27/2021] chlordiazePOXIDE  25 mg Oral BH-qamhs   Followed by   Derrill Memo ON  06/28/2021] chlordiazePOXIDE  25 mg Oral Daily   FLUoxetine  40 mg Oral Daily   folic acid  1 mg Intravenous Daily   furosemide  80 mg Oral Daily   lactulose  20 g Oral TID   levETIRAcetam  750 mg Oral BID   multivitamin with minerals  1 tablet Oral Daily   [START ON 06/29/2021] pantoprazole  40 mg Intravenous Q12H   thiamine  100 mg Oral Daily   Or   thiamine  100 mg Intravenous Daily   Infusions:  cefTRIAXone (ROCEPHIN)  IV Stopped (06/25/21 2028)   [START ON 06/27/2021] magnesium sulfate bolus IVPB     magnesium sulfate bolus IVPB 4 g (06/26/21 0814)   octreotide  (SANDOSTATIN)    IV infusion 50 mcg/hr (06/26/21 0516)   pantoprazole 8 mg/hr (06/26/21 0459)   PRN Meds: LORazepam **OR** LORazepam   Allergies as of 06/25/2021 - Review Complete 06/25/2021  Allergen Reaction Noted   Other Other (See Comments) 12/15/2019    Family History  Problem Relation Age of Onset   Lung cancer Mother 21   Alcohol abuse Mother    Throat cancer Father 38    Social History   Socioeconomic History   Marital status: Divorced    Spouse name: Not on file   Number of children: 2   Years of education: Not on file   Highest education level: Not on file  Occupational History   Occupation: applying for disability  Tobacco Use   Smoking status: Never   Smokeless tobacco: Never  Vaping Use   Vaping Use: Never used  Substance and Sexual Activity   Alcohol use: Not Currently    Alcohol/week: 6.0 standard drinks    Types: 6 Cans of beer per week    Comment: weekly   Drug use: Not Currently   Sexual activity: Not on file  Other Topics Concern   Not on file  Social History Narrative   She moved with a boyfriend to [place] and was [facility].  He died of a massive heart attack in 8/18, per her report, and so she moved back to [place] and is living with a friend.      Right handed   2 story home    Lives alone 11/09/19   Social Determinants of Health    Financial Resource Strain: Not on file  Food Insecurity: Not on file  Transportation Needs: Not on file  Physical Activity: Not on file  Stress: Not on file  Social Connections: Not on file  Intimate Partner Violence: Not on file    REVIEW OF SYSTEMS: Constitutional: Some fatigue not profound.  Weight fluctuates over the course of several months between 133 and 145 pounds. ENT:  No nose bleeds Pulm: No shortness of breath. CV:  No palpitations, no LE edema.  GU:  No hematuria, no frequency GI: As per HPI.  Appetite is fair. Heme: Bleeding as per HPI. Transfusions: Transfusions with platelets on several occasions in 2019, 2020, 2021. Neuro:  No headaches, no peripheral tingling or numbness Derm:  No itching, no rash or sores.  Endocrine:  No sweats or chills.  No polyuria or dysuria Immunization: Reviewed. Travel:  None beyond local counties in last few months.    PHYSICAL EXAM: Vital signs in last 24 hours: Vitals:   06/26/21 0800 06/26/21 0815  BP: (!) 87/57 (!) 99/59  Pulse: (!) 114 (!) 114  Resp: (!) 27 (!) 21  Temp:    SpO2: (!) 88% 92%   Wt Readings from Last 3 Encounters:  06/21/21 66.2 kg  06/20/21 65.9 kg  06/13/21 64 kg    General: Disheveled, does not look acutely ill.  Alert, comfortable.  Slightly jaundiced Head: No facial asymmetry or swelling.  No signs of head trauma.  Unable to palpate any scar or scab on the scalp in the region of her recent laceration. Eyes: Scleral icterus.  No conjunctival pallor. Ears: Not hard of hearing Nose: No congestion or discharge Mouth: Moist, clear, pink oral mucosa.  Tongue midline. Neck: No JVD.  No thyromegaly.  No masses Lungs: No labored breathing or cough.  Lungs CTA bilaterally. Heart: Regular rhythm, slightly tachycardic.  No MRG. Abdomen: Soft, nondistended.  No obvious ascites/fluid wave.  Not tender.  Bowel sounds active.  Unable to appreciate HSM.[name] Kitchen   Rectal: No blood or stool visible or on exam glove.   No masses. Musc/Skeltl: No joint redness, swelling or gross deformity. Extremities: No CCE. Neurologic: No tremor, no asterixis.  Oriented x3.  Good historian.  Moves all 4 limbs with grossly full strength. Skin: No rash, no sores, no significant purpura or bruising. Tattoos: All to pull tattoos on her back, arms, feet. Nodes: No cervical adenopathy Psych: Cooperative, calm, pleasant.  Fluid speech.  Intake/Output from previous day: 10/17 0701 - 10/18 0700 In: 347.3 [IV Piggyback:347.3] Out: 650 [Urine:650] Intake/Output this shift: Total I/O In: -  Out: 200 [Urine:200]  LAB RESULTS: Recent Labs    06/25/21 1032 06/26/21 0459  WBC 4.7 4.1  HGB 11.5* 11.0*  HCT 33.9* 32.2*  PLT 57* 51*   BMET Lab Results  Component Value Date   NA 132 (L) 06/26/2021   NA 135 06/25/2021   NA 145 06/21/2021   K 4.1 06/26/2021   K 3.1 (L) 06/25/2021   K 3.6 06/21/2021   CL 89 (L) 06/26/2021   CL 96 (L) 06/25/2021   CL 107 06/21/2021   CO2 26 06/26/2021   CO2 23 06/25/2021   CO2 22  06/21/2021   GLUCOSE 91 06/26/2021   GLUCOSE 100 (H) 06/25/2021   GLUCOSE 98 04/08/2021   BUN <5 (L) 06/26/2021   BUN <5 (L) 06/25/2021   BUN 4 06/21/2021   CREATININE 0.79 06/26/2021   CREATININE 0.70 06/25/2021   CREATININE 0.6 06/21/2021   CALCIUM 7.0 (L) 06/26/2021   CALCIUM 8.5 (L) 06/25/2021   CALCIUM 7.6 (A) 06/21/2021   LFT Recent Labs    06/25/21 1032 06/26/21 0459  PROT 7.3 6.5  ALBUMIN 2.9* 2.5*  AST 266* 230*  ALT 57* 49*  ALKPHOS 267* 219*  BILITOT 8.0* 8.4*   PT/INR Lab Results  Component Value Date   INR 1.3 (H) 06/26/2021   INR 1.2 06/25/2021   INR 1.3 (H) 04/04/2021   Hepatitis Panel No results for input(s): HEPBSAG, HCVAB, HEPAIGM, HEPBIGM in the last 72 hours. C-Diff No components found for: CDIFF Lipase     Component Value Date/Time   LIPASE 63 (H) 06/25/2021 1032    Drugs of Abuse     Component Value Date/Time   LABOPIA NONE DETECTED 05/06/2019 0319    COCAINSCRNUR NONE DETECTED 05/06/2019 0319   LABBENZ NONE DETECTED 05/06/2019 0319   AMPHETMU NONE DETECTED 05/06/2019 0319   THCU NONE DETECTED 05/06/2019 0319   LABBARB NONE DETECTED 05/06/2019 0319     RADIOLOGY STUDIES: DG Chest 2 View  Result Date: 06/25/2021 CLINICAL DATA:  Chest pain EXAM: CHEST - 2 VIEW COMPARISON:  Radiograph 06/22/2021 FINDINGS: Unchanged cardiomediastinal silhouette with calcified left hilar lymph nodes. There is no new focal airspace disease. There is no large pleural effusion or visible pneumothorax. Bilateral shoulder degenerative changes. IMPRESSION: No evidence of acute cardiopulmonary disease. Electronically Signed   By: Maurine Simmering M.D.   On: 06/25/2021 11:27     IMPRESSION:      Hematemesis in patient with known esophageal varices and portal hypertensive gastropathy.  Minimal change of Hgb in setting of GI bleed.  Mild anemia  Chronic thrombocytopenia treated by oncology with weekly Nplate     Cirrhosis of the liver due to alcohol and hepatitis C.  Completed Epclusa HCV eradication in 01/2020 through the infectious disease clinic in [place].  Mild coagulopathy.  Ongoing alcohol abuse/alcoholism.    PLAN:     EGD planned for this morning.  Continue octreotide and PPI drips for now along with daily Rocephin.     Azucena Freed  06/26/2021, 10:03 AM Phone [phone]   Attending physician's note   I have taken an interval history, reviewed the chart and examined the patient. I agree with the Advanced Practitioner's note, impression and recommendations.   50yrold with decompensated advanced ETOH/HCV liver cirrhosis with portal hypertension- known EV/portal hypertensive gastropathy admitted with hematemesis. HD stable. Hb 11, Plt 51K, INR 1.3. MELD-Na 22/MDF 20  HCV-treated with Epclusa with SVR 01/2020  Has associated jaundice, thrombocytopenia (on weekly Nplate thru [facility])  [facility] minimal [facility]. Too  little to tap  Subclinical hepatic encephalopathy on lactulose.  Not a transplant candidate d/t ongoing EtOH abuse.  Associated H/O ETOH chronic calcific pancreatitis, SZ D/O, schizophrenia/bipolar disorder.     Plan: -EGD today as soon as possible. -IV Protonix/octreotide/Rocephin -Trend CBC -UKoreawith ascitic tap (if + ascites) later today. Has developed ascites clinically since last CT. -Stop EtOH -Poor prognosis with high mortality d/t continued ETOH abuse.   I have discussed the risks and benefits. The risks including rare risk of perforation, bleeding, missed UGI neoplasms,  risks of anesthesia/sedation. Alternatives were given. Patient is aware and agrees to proceed. All the questions were answered.   Carmell Austria, MD [facility] [phone]

## 2021-06-26 NOTE — Progress Notes (Signed)
Family Medicine Teaching Service Daily Progress Note Intern Pager: [phone]  Patient name: Briana Sweeney Medical record number: [id number] Date of birth: December 06, 1970 Age: 50 y.o. Gender: female  Primary Care Provider: [name], FNP (Inactive) Consultants: GI Code Status: Full  Pt Overview and Major Events to Date:  06/25/2021-admitted  Assessment and Plan: Briana Sweeney is a 50 year old female presenting with hematemesis, melena, hematochezia.  PMH is significant for alcohol use disorder, cirrhosis, ascites, seizure disorder, and bipolar/schizophrenia.  Hematemesis  concern for variceal bleed CT from [facility] on 06/22/2021 showed advanced hepatic cysts that of ptosis and cirrhosis with evidence of portal hypertension, large esophageal varices and small volume ascites.  FOBT positive in ED yesterday with elevated LFTs and ammonia, lactic acid 2.8, lipase of 63. GI has been consulted.  Hgb today of 11, down from 11.5 yesterday.  Magnesium low at 1.1 this morning, potassium WNL at 4.1. Pt is currently mentating well.  Patient states she had leg cramping throughout the night which made it difficult to sleep.  This is most likely related to her low magnesium. Pharmacologic therapy for esophageal varices should be continued for 3-5 days.  Patient has low blood pressure this morning of 87/57 but seems to run low at base line.  -GI consulted, appreciate recs -IV octreotide 25 mL/h -IV pantoprazole 10 mL/h for total of 72 hours, started last night -IV pantoprazole 40 mg twice daily starting 06/29/2021 -CTX for SBP prophylaxis -Continuous cardiac monitoring -Keep NPO until GI sees  pt and makes recommendations -AM magnesium -AM CBC -AM CMP -Magnesium 4 g IV this morning -Magnesium 2 g IV daily starting tomorrow   Alcoholic Cirrhosis  ascites MELD-Na score of 22 today with 7-10% mortality. LFT chronically elevated with labs today showing AST of 230, ALT 49 alk phos 219,  total bilirubin 8.4, albumin 2.5, PTT 16.6, INR 1.3.  Ammonia was elevated at 71 yesterday.  Home medications include lactulose and Lasix.  Patient states she has some shortness of breath on room air with oxygen saturation in the low 90s and patient is tachycardic 110-130 .  I suspect this is due to her ascites. Pt has palmer erythema, bilateral pitting edema. No evidence of hepatic encephalopathy.  -Ceftriaxone 1 g daily for SBP prophylaxis -Continue home lactulose 20 g 3 times daily -Continue home Lasix 80 mg daily  Alcohol abuse Patient has 10-year history of consuming 1 pint of vodka daily.  Last drink was 06/24/2021 at 9:00 PM.  Patient is on CIWA monitoring, Librium taper and received overnight Ativan at [place] and 0518.  -CIWA monitoring with as needed Ativan -Librium taper -Thiamine 100 mg oral or IV daily -Folic acid 1 mg daily daily -Multivitamin 1 tablet daily  Headache Pt is complaining of frontal headache she describes as sharp and stabbing in both eyes which she rates as a 9/10.  -Tylenol 1 g IV one time dose  -Low threshold for imaging if worsens, new symptoms such as vomiting -CTM  Chronic thrombocytopenia Platelets of 51 today, down from 57 yesterday.  Patient follows with hematology for ITP and had platelet transfusion last week per [facility] ED note. -Daily CBC  Depression  schizophrenia  bipolar disorder Home medications include Abilify and Prozac -Continue home Abilify 10 mg daily and Prozac 40 mg daily   Seizure disorder Patient states she has had a seizure disorder since age 32 and has also had seizures due to alcohol withdrawal in the past.  Current home medications include Keppra 750 mg  twice daily -Continue Keppra -Management of alcohol use disorder/withdrawal as above   History of hepatitis C -HCV RNA quant in process    FEN/GI: NPO PPx: SCDs Dispo:Home pending clinical improvement . Barriers include GI consult, recommendations, cessation of bleeding.    Subjective:  Patient complains of a frontal headache she describes as sharp and stabbing in both eyes and rates at a 9/10 for pain.  He is asking for pain medication.  States her legs were cramping throughout the night making it difficult to sleep.  Patient states she feels a little short of breath this morning on room air with oxygen saturation of 90%.    Objective: Temp:  [98.9 F (37.2 C)] 98.9 F (37.2 C) (10/17 1008) Pulse Rate:  [112-129] 120 (10/18 0500) Resp:  [15-28] 25 (10/18 0445) BP: (112-143)/(68-107) 112/69 (10/18 0500) SpO2:  [89 %-98 %] 95 % (10/18 0445) Physical Exam: General: Patient lying supine in bed, NAD Cardiovascular: Regular rhythm, tachycardic, systolic murmur present Respiratory: CTAB, normal work of breathing, oxygen saturation of 90% on room air. Abdomen: Diffusely tender to palpation, worse in the right upper quadrant and suprapubic area.  Distended due to abdominal ascites, normal bowel sounds Extremities: 2+ pitting edema of BLEs  Laboratory: Recent Labs  Lab 06/21/21 0000 06/25/21 1032 06/26/21 0459  WBC 2.3 4.7 4.1  HGB 12.6 11.5* 11.0*  HCT 39 33.9* 32.2*  PLT 55* 57* 51*   Recent Labs  Lab 06/21/21 0000 06/25/21 1032  NA 145 135  K 3.6 3.1*  CL 107 96*  CO2 22 23  BUN 4 <5*  CREATININE 0.6 0.70  CALCIUM 7.6* 8.5*  PROT  --  7.3  BILITOT  --  8.0*  ALKPHOS 380* 267*  ALT 66* 57*  AST 299* 266*  GLUCOSE  --  100Precious Gilding, DO 06/26/2021, 5:34 AM PGY-1, [place] Intern pager: [phone], text pages welcome She is

## 2021-06-26 NOTE — Anesthesia Procedure Notes (Signed)
Procedure Name: Intubation Date/Time: 06/26/2021 10:39 AM Performed by: Gaylene Brooks, CRNA Pre-anesthesia Checklist: Patient identified, Emergency Drugs available, Suction available and Patient being monitored Patient Re-evaluated:Patient Re-evaluated prior to induction Oxygen Delivery Method: Circle System Utilized Preoxygenation: Pre-oxygenation with 100% oxygen Induction Type: IV induction, Rapid sequence and Cricoid Pressure applied Ventilation: Mask ventilation without difficulty Laryngoscope Size: Miller and 2 Grade View: Grade II Tube type: Oral Tube size: 7.0 mm Number of attempts: 1 Airway Equipment and Method: Stylet and Oral airway Placement Confirmation: ETT inserted through vocal cords under direct vision, positive ETCO2 and breath sounds checked- equal and bilateral Secured at: 22 cm Tube secured with: Tape Dental Injury: Teeth and Oropharynx as per pre-operative assessment

## 2021-06-27 ENCOUNTER — Encounter (HOSPITAL_COMMUNITY): Payer: Self-pay | Admitting: Gastroenterology

## 2021-06-27 ENCOUNTER — Ambulatory Visit: Payer: Self-pay

## 2021-06-27 DIAGNOSIS — R569 Unspecified convulsions: Secondary | ICD-10-CM

## 2021-06-27 DIAGNOSIS — D696 Thrombocytopenia, unspecified: Secondary | ICD-10-CM

## 2021-06-27 DIAGNOSIS — D693 Immune thrombocytopenic purpura: Secondary | ICD-10-CM | POA: Diagnosis not present

## 2021-06-27 DIAGNOSIS — K920 Hematemesis: Secondary | ICD-10-CM | POA: Diagnosis not present

## 2021-06-27 DIAGNOSIS — K7031 Alcoholic cirrhosis of liver with ascites: Secondary | ICD-10-CM | POA: Diagnosis not present

## 2021-06-27 LAB — BASIC METABOLIC PANEL
Anion gap: 8 (ref 5–15)
BUN: <5 mg/dL — ABNORMAL LOW (ref 6–20)
CO2: 34 mmol/L — ABNORMAL HIGH (ref 22–32)
Calcium: 7.5 mg/dL — ABNORMAL LOW (ref 8.9–10.3)
Chloride: 91 mmol/L — ABNORMAL LOW (ref 98–111)
Creatinine, Ser: 0.74 mg/dL (ref 0.44–1.00)
GFR, Estimated: >60 mL/min (ref >60)
Glucose, Bld: 142 mg/dL — ABNORMAL HIGH (ref 70–99)
Potassium: 3.2 mmol/L — ABNORMAL LOW (ref 3.5–5.1)
Sodium: 133 mmol/L — ABNORMAL LOW (ref 135–145)

## 2021-06-27 LAB — COMPREHENSIVE METABOLIC PANEL
ALT: 46 U/L — ABNORMAL HIGH (ref 0–44)
AST: 192 U/L — ABNORMAL HIGH (ref 15–41)
Albumin: 2.6 g/dL — ABNORMAL LOW (ref 3.5–5.0)
Alkaline Phosphatase: 247 U/L — ABNORMAL HIGH (ref 38–126)
Anion gap: 14 (ref 5–15)
BUN: <5 mg/dL — ABNORMAL LOW (ref 6–20)
CO2: 32 mmol/L (ref 22–32)
Calcium: 6.6 mg/dL — ABNORMAL LOW (ref 8.9–10.3)
Chloride: 79 mmol/L — ABNORMAL LOW (ref 98–111)
Creatinine, Ser: 0.84 mg/dL (ref 0.44–1.00)
GFR, Estimated: >60 mL/min (ref >60)
Glucose, Bld: 170 mg/dL — ABNORMAL HIGH (ref 70–99)
Potassium: 2.2 mmol/L — CL (ref 3.5–5.1)
Sodium: 125 mmol/L — ABNORMAL LOW (ref 135–145)
Total Bilirubin: 9.3 mg/dL — ABNORMAL HIGH (ref 0.3–1.2)
Total Protein: 6.9 g/dL (ref 6.5–8.1)

## 2021-06-27 LAB — CBC
HCT: 31.7 % — ABNORMAL LOW (ref 36.0–46.0)
Hemoglobin: 11 g/dL — ABNORMAL LOW (ref 12.0–15.0)
MCH: 30.8 pg (ref 26.0–34.0)
MCHC: 34.7 g/dL (ref 30.0–36.0)
MCV: 88.8 fL (ref 80.0–100.0)
Platelets: 69 10*3/uL — ABNORMAL LOW (ref 150–400)
RBC: 3.57 MIL/uL — ABNORMAL LOW (ref 3.87–5.11)
RDW: 20.3 % — ABNORMAL HIGH (ref 11.5–15.5)
WBC: 3.1 10*3/uL — ABNORMAL LOW (ref 4.0–10.5)
nRBC: 0 % (ref 0.0–0.2)

## 2021-06-27 LAB — MAGNESIUM: Magnesium: 1.6 mg/dL — ABNORMAL LOW (ref 1.7–2.4)

## 2021-06-27 LAB — PATHOLOGIST SMEAR REVIEW

## 2021-06-27 LAB — HCV RNA QUANT: HCV Quantitative: NOT DETECTED IU/mL (ref >50)

## 2021-06-27 MED ORDER — SPIRONOLACTONE 25 MG PO TABS
50.0000 mg | ORAL_TABLET | Freq: Every day | ORAL | Status: DC
  Administered 2021-06-27 – 2021-06-28 (×2): 50 mg via ORAL
  Filled 2021-06-27 (×2): qty 2

## 2021-06-27 MED ORDER — POTASSIUM CHLORIDE 10 MEQ/100ML IV SOLN
10.0000 meq | INTRAVENOUS | Status: AC
  Administered 2021-06-27 (×4): 10 meq via INTRAVENOUS
  Filled 2021-06-27 (×3): qty 100

## 2021-06-27 MED ORDER — ACETAMINOPHEN 325 MG PO TABS: 325.0000 mg | ORAL_TABLET | Freq: Four times a day (QID) | ORAL | Status: DC | PRN

## 2021-06-27 MED ORDER — POTASSIUM CHLORIDE CRYS ER 20 MEQ PO TBCR: 40.0000 meq | EXTENDED_RELEASE_TABLET | Freq: Two times a day (BID) | ORAL | Status: DC

## 2021-06-27 MED ORDER — POTASSIUM CHLORIDE CRYS ER 20 MEQ PO TBCR
40.0000 meq | EXTENDED_RELEASE_TABLET | ORAL | Status: AC
Start: 2021-06-27 — End: 2021-06-27
  Administered 2021-06-27 (×2): 40 meq via ORAL
  Filled 2021-06-27 (×2): qty 2

## 2021-06-27 MED ORDER — POTASSIUM CHLORIDE CRYS ER 20 MEQ PO TBCR
40.0000 meq | EXTENDED_RELEASE_TABLET | Freq: Once | ORAL | Status: AC
  Administered 2021-06-27: 40 meq via ORAL
  Filled 2021-06-27: qty 2

## 2021-06-27 MED ORDER — ACETAMINOPHEN 325 MG PO TABS
650.0000 mg | ORAL_TABLET | Freq: Once | ORAL | Status: AC
  Administered 2021-06-27: 650 mg via ORAL
  Filled 2021-06-27: qty 2

## 2021-06-27 NOTE — Progress Notes (Signed)
Critical LAB K- 2.2 paged Lyndee Hensen, DO on [phone] and amion as well as S chat. See Mar for new orders.

## 2021-06-27 NOTE — Evaluation (Signed)
Physical Therapy Evaluation Patient Details Name: Briana Sweeney MRN: [id number] DOB: 18-Jun-1971 Today's Date: 06/27/2021  History of Present Illness  Pt is 50 yo female who presented on 06/26/21 with hematemesis, melena, hematochezia.   Pt had upper GI endoscopy on 06/23/21 and found to have large esophageal varices. Pt with hx of EtOH abuse with cirrhosis, ascites, seizure disorder, and bipolar/schizopnrenia  Clinical Impression  Pt admitted with above diagnosis. At baseline, pt lives alone and is independent with all activities.  Today, pt lethargic but able to participate with therapy.  She demonstrated transfers with supervision and ambulated in room with min guard. She did demonstrate mild unsteadiness and generalized weakness, so will benefit from further therapy but suspect no PT needs at d/c.  Pt currently with functional limitations due to the deficits listed below (see PT Problem List). Pt will benefit from skilled PT to increase their independence and safety with mobility to allow discharge to the venue listed below.          Recommendations for follow up therapy are one component of a multi-disciplinary discharge planning process, led by the attending physician.  Recommendations may be updated based on patient status, additional functional criteria and insurance authorization.  Follow Up Recommendations Supervision - Intermittent;No PT follow up    Equipment Recommendations  None recommended by PT    Recommendations for Other Services       Precautions / Restrictions Precautions Precautions: Fall      Mobility  Bed Mobility Overal bed mobility: Needs Assistance Bed Mobility: Supine to Sit;Sit to Supine     Supine to sit: Supervision Sit to supine: Supervision        Transfers Overall transfer level: Needs assistance Equipment used: None Transfers: Sit to/from Stand Sit to Stand: Min guard         General transfer comment: min guard for safety and  lines  Ambulation/Gait Ambulation/Gait assistance: Min guard Gait Distance (Feet): 26 Feet Assistive device: None Gait Pattern/deviations: Step-through pattern;Decreased stride length Gait velocity: decreased   General Gait Details: mild unsteadiness, fatigued easily  Stairs            Wheelchair Mobility    Modified Rankin (Stroke Patients Only)       Balance Overall balance assessment: Needs assistance Sitting-balance support: No upper extremity supported Sitting balance-Leahy Scale: Good     Standing balance support: No upper extremity supported Standing balance-Leahy Scale: Fair Standing balance comment: Ambulated without AD but mild unsteadiness                             Pertinent Vitals/Pain Pain Assessment: 0-10 Pain Score: 8  Pain Location: head Pain Descriptors / Indicators: Aching Pain Intervention(s): Limited activity within patient's tolerance;Monitored during session    Home Living Family/patient expects to be discharged to:: Private residence Living Arrangements: Alone Available Help at Discharge: Family;Friend(s);Available PRN/intermittently Type of Home: Apartment Home Access: Elevator     Home Layout: One level Home Equipment: None      Prior Function Level of Independence: Independent         Comments: Independent with driving, IADLS, ADLs, shopping     Hand Dominance        Extremity/Trunk Assessment   Upper Extremity Assessment Upper Extremity Assessment:  (ROM WFL; MMT 4/5)    Lower Extremity Assessment Lower Extremity Assessment:  (ROM WFL; MMT 4/5)    Cervical / Trunk Assessment Cervical / Trunk Assessment: Normal  Communication   Communication: No difficulties  Cognition Arousal/Alertness: Lethargic Behavior During Therapy: WFL for tasks assessed/performed Overall Cognitive Status: Within Functional Limits for tasks assessed                                        General  Comments General comments (skin integrity, edema, etc.): VSS    Exercises     Assessment/Plan    PT Assessment Patient needs continued PT services  PT Problem List Decreased strength;Decreased mobility;Decreased activity tolerance;Decreased balance;Decreased knowledge of use of DME       PT Treatment Interventions DME instruction;Therapeutic activities;Gait training;Therapeutic exercise;Patient/family education;Balance training;Functional mobility training    PT Goals (Current goals can be found in the Care Plan section)  Acute Rehab PT Goals Patient Stated Goal: return home PT Goal Formulation: With patient Time For Goal Achievement: 07/11/21 Potential to Achieve Goals: Good    Frequency Min 3X/week   Barriers to discharge        Co-evaluation               AM-PAC PT "6 Clicks" Mobility  Outcome Measure Help needed turning from your back to your side while in a flat bed without using bedrails?: None Help needed moving from lying on your back to sitting on the side of a flat bed without using bedrails?: None Help needed moving to and from a bed to a chair (including a wheelchair)?: A Little Help needed standing up from a chair using your arms (e.g., wheelchair or bedside chair)?: A Little Help needed to walk in hospital room?: A Little Help needed climbing 3-5 steps with a railing? : A Little 6 Click Score: 20    End of Session Equipment Utilized During Treatment: Gait belt Activity Tolerance: Patient tolerated treatment well Patient left: in bed;with call bell/phone within reach;with bed alarm set Nurse Communication: Mobility status PT Visit Diagnosis: Other abnormalities of gait and mobility (R26.89);Muscle weakness (generalized) (M62.81)    Time: 1715-1730 PT Time Calculation (min) (ACUTE ONLY): 15 min   Charges:   PT Evaluation $PT Eval Low Complexity: 1 Low          Thurmon Mizell, PT Acute Rehab Services Pager [phone] [facility]  [phone]   Karlton Lemon 06/27/2021, 5:43 PM

## 2021-06-27 NOTE — Progress Notes (Signed)
FPTS Brief Progress Note  S: Pt resting   O: BP 127/88 (BP Location: Right Arm)   Pulse (!) 105   Temp 98.3 F (36.8 C) (Oral)   Resp 20   SpO2 92%    GEN: resting in bed   A/P: Last CIWA 3 at 8pm.  Asked for repeat CIWA.  Morning labs pending.  - Orders reviewed. Labs for AM ordered, which was adjusted as needed.    Lyndee Hensen, DO 06/27/2021, 4:29 AM PGY-3, [facility] Night Resident  Please page [phone] with questions.

## 2021-06-27 NOTE — Progress Notes (Signed)
Progress Note    ASSESSMENT AND PLAN:   UGI bleed d/t EV s/p band ligation x 6 10/18. No further bleeding.  Didn't tolerate nadolol in past d/t hypotension Ascites s/p LVP (1 lit) yesterday. No SBP ETOH cirrhosis with jaundice, thrombocytopenia Subclinical hepatic encephalopathy  Plan: -Advance diet to full today, soft low salt diet by AM tomorrow -Trend CBC -Continue IV octreotide x 72 hours -IV Rocephin x 5 days -Can transition IV Protonix to p.o. once able to tolerate p.o. -Replace electrolytes -Add Aldactone 100 mg p.o. once a day to Lasix -Continue lactulose.  Titrate to 2-2 Bms/day -We have not added rifaximin as yet d/t cost/affordability -Minimize Ativan     SUBJECTIVE   Doing well. No complaints No nausea, vomiting, melena or hematochezia. Tolerating clear liquids No abdominal pain    OBJECTIVE:     Vital signs in last 24 hours: Temp:  [98.1 F (36.7 C)-98.5 F (36.9 C)] 98.1 F (36.7 C) (10/19 1128) Pulse Rate:  [98-109] 98 (10/19 1128) Resp:  [19-23] 23 (10/19 1128) BP: (101-127)/(71-88) 106/76 (10/19 1128) SpO2:  [85 %-100 %] 94 % (10/19 1128) Last BM Date: 06/26/21 General:   Alert, female in NAD EENT:  Normal hearing,  icteric sclera Heart:  Regular rate and rhythm; no murmur.  No lower extremity edema   Pulm: Normal respiratory effort, lungs CTA bilaterally without wheezes or crackles. Abdomen:  Soft, mildly distended, nontender.  Normal bowel sounds,.       Neurologic:  Alert and  oriented x4;  grossly normal neurologically. Psych:  Pleasant, cooperative.  Normal mood and affect.   Intake/Output from previous day: 10/18 0701 - 10/19 0700 In: 730.7 [I.V.:730.7] Out: 200 [Urine:200] Intake/Output this shift: Total I/O In: 570 [P.O.:120; IV Piggyback:450] Out: 300 [Urine:300]  Lab Results: Recent Labs    06/25/21 1032 06/26/21 0459 06/27/21 0319  WBC 4.7 4.1 3.1*  HGB 11.5* 11.0* 11.0*  HCT 33.9* 32.2* 31.7*  PLT 57* 51*  69*   BMET Recent Labs    06/25/21 1032 06/26/21 0459 06/27/21 0319  NA 135 132* 125*  K 3.1* 4.1 2.2*  CL 96* 89* 79*  CO2 23 26 32  GLUCOSE 100* 91 170*  BUN <5* <5* <5*  CREATININE 0.70 0.79 0.84  CALCIUM 8.5* 7.0* 6.6*   LFT Recent Labs    06/27/21 0319  PROT 6.9  ALBUMIN 2.6*  AST 192*  ALT 46*  ALKPHOS 247*  BILITOT 9.3*   PT/INR Recent Labs    06/25/21 1501 06/26/21 0600  LABPROT 15.5* 16.6*  INR 1.2 1.3*   Hepatitis Panel No results for input(s): HEPBSAG, HCVAB, HEPAIGM, HEPBIGM in the last 72 hours.  IR Paracentesis  Result Date: 06/26/2021 INDICATION: Patient with history of liver cirrhosis, portal hypertension, esophageal varices with ascites. Request for therapeutic and diagnostic paracentesis EXAM: ULTRASOUND GUIDED THERAPEUTIC AND DIAGNOSTIC PARACENTESIS MEDICATIONS: Lidocaine 1% 10 mL COMPLICATIONS: None immediate. PROCEDURE: Informed written consent was obtained from the patient after a discussion of the risks, benefits and alternatives to treatment. A timeout was performed prior to the initiation of the procedure. Initial ultrasound scanning demonstrates a small amount of ascites within the left lower abdominal quadrant. The left lower abdomen was prepped and draped in the usual sterile fashion. 1% lidocaine was used for local anesthesia. Following this, a 19 gauge, 10-cm, Yueh catheter was introduced. An ultrasound image was saved for documentation purposes. The paracentesis was performed. The catheter was removed and a dressing was applied. The  patient tolerated the procedure well without immediate post procedural complication. FINDINGS: A total of approximately 1.1 L of straw-colored fluid was removed. Samples were sent to the laboratory as requested by the clinical team. IMPRESSION: Successful ultrasound-guided therapeutic and diagnostic paracentesis yielding 1.1 liters of peritoneal fluid. Read by: Rushie Nyhan, NP Electronically Signed   By: Markus Daft M.D.   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

## 2021-06-28 ENCOUNTER — Inpatient Hospital Stay (HOSPITAL_COMMUNITY): Payer: Medicaid Other

## 2021-06-28 DIAGNOSIS — D693 Immune thrombocytopenic purpura: Secondary | ICD-10-CM | POA: Diagnosis not present

## 2021-06-28 DIAGNOSIS — F10939 Alcohol use, unspecified with withdrawal, unspecified: Secondary | ICD-10-CM | POA: Diagnosis not present

## 2021-06-28 DIAGNOSIS — I8511 Secondary esophageal varices with bleeding: Secondary | ICD-10-CM

## 2021-06-28 DIAGNOSIS — K7031 Alcoholic cirrhosis of liver with ascites: Secondary | ICD-10-CM | POA: Diagnosis not present

## 2021-06-28 DIAGNOSIS — K920 Hematemesis: Secondary | ICD-10-CM | POA: Diagnosis not present

## 2021-06-28 LAB — COMPREHENSIVE METABOLIC PANEL
ALT: 41 U/L (ref 0–44)
AST: 163 U/L — ABNORMAL HIGH (ref 15–41)
Albumin: 2.3 g/dL — ABNORMAL LOW (ref 3.5–5.0)
Alkaline Phosphatase: 237 U/L — ABNORMAL HIGH (ref 38–126)
Anion gap: 11 (ref 5–15)
BUN: <5 mg/dL — ABNORMAL LOW (ref 6–20)
CO2: 30 mmol/L (ref 22–32)
Calcium: 8 mg/dL — ABNORMAL LOW (ref 8.9–10.3)
Chloride: 92 mmol/L — ABNORMAL LOW (ref 98–111)
Creatinine, Ser: 0.72 mg/dL (ref 0.44–1.00)
GFR, Estimated: >60 mL/min (ref >60)
Glucose, Bld: 163 mg/dL — ABNORMAL HIGH (ref 70–99)
Potassium: 3.7 mmol/L (ref 3.5–5.1)
Sodium: 133 mmol/L — ABNORMAL LOW (ref 135–145)
Total Bilirubin: 10.2 mg/dL — ABNORMAL HIGH (ref 0.3–1.2)
Total Protein: 6.8 g/dL (ref 6.5–8.1)

## 2021-06-28 LAB — CBC
HCT: 33 % — ABNORMAL LOW (ref 36.0–46.0)
Hemoglobin: 11.1 g/dL — ABNORMAL LOW (ref 12.0–15.0)
MCH: 30.7 pg (ref 26.0–34.0)
MCHC: 33.6 g/dL (ref 30.0–36.0)
MCV: 91.4 fL (ref 80.0–100.0)
Platelets: 79 10*3/uL — ABNORMAL LOW (ref 150–400)
RBC: 3.61 MIL/uL — ABNORMAL LOW (ref 3.87–5.11)
RDW: 20.6 % — ABNORMAL HIGH (ref 11.5–15.5)
WBC: 3.2 10*3/uL — ABNORMAL LOW (ref 4.0–10.5)
nRBC: 0 % (ref 0.0–0.2)

## 2021-06-28 LAB — GRAM STAIN

## 2021-06-28 LAB — MAGNESIUM: Magnesium: 1.7 mg/dL (ref 1.7–2.4)

## 2021-06-28 LAB — AFP TUMOR MARKER: AFP, Serum, Tumor Marker: 5.1 ng/mL (ref 0.0–6.4)

## 2021-06-28 MED ORDER — AMOXICILLIN-POT CLAVULANATE 875-125 MG PO TABS
1.0000 | ORAL_TABLET | Freq: Two times a day (BID) | ORAL | Status: DC
  Administered 2021-06-28 – 2021-06-29 (×2): 1 via ORAL
  Filled 2021-06-28 (×2): qty 1

## 2021-06-28 MED ORDER — SPIRONOLACTONE 25 MG PO TABS
100.0000 mg | ORAL_TABLET | Freq: Every day | ORAL | Status: DC
  Administered 2021-06-29: 100 mg via ORAL
  Filled 2021-06-28: qty 4

## 2021-06-28 MED ORDER — PANTOPRAZOLE SODIUM 40 MG PO TBEC
40.0000 mg | DELAYED_RELEASE_TABLET | Freq: Every day | ORAL | Status: DC
  Administered 2021-06-29: 40 mg via ORAL
  Filled 2021-06-28: qty 1

## 2021-06-28 MED ORDER — CIPROFLOXACIN HCL 500 MG PO TABS
500.0000 mg | ORAL_TABLET | Freq: Two times a day (BID) | ORAL | Status: DC
  Administered 2021-06-28 – 2021-06-29 (×2): 500 mg via ORAL
  Filled 2021-06-28 (×2): qty 1

## 2021-06-28 MED ORDER — FUROSEMIDE 40 MG PO TABS
40.0000 mg | ORAL_TABLET | Freq: Every day | ORAL | Status: DC
  Administered 2021-06-28 – 2021-06-29 (×2): 40 mg via ORAL
  Filled 2021-06-28 (×2): qty 1

## 2021-06-28 MED ORDER — CLONAZEPAM 0.5 MG PO TABS
0.5000 mg | ORAL_TABLET | Freq: Two times a day (BID) | ORAL | Status: DC
  Administered 2021-06-28 – 2021-06-29 (×3): 0.5 mg via ORAL
  Filled 2021-06-28 (×3): qty 1

## 2021-06-28 MED ORDER — FOLIC ACID 1 MG PO TABS
1.0000 mg | ORAL_TABLET | Freq: Every day | ORAL | Status: DC
  Administered 2021-06-28 – 2021-06-29 (×2): 1 mg via ORAL
  Filled 2021-06-28 (×2): qty 1

## 2021-06-28 NOTE — Plan of Care (Signed)

## 2021-06-28 NOTE — Progress Notes (Signed)
FPTS Brief Progress Note:  Late Entry  S:Pt has no concerns and states she is ok. RN states pt complained of headache earlier in the shift.    O: BP 108/84 (BP Location: Left Arm)   Pulse 95   Temp 98 F (36.7 C) (Oral)   Resp 17   SpO2 97%    GEN: resting in bed, in no acute distress   A/P:  Headache  - Low dose Tylenol PRN  - Consider CT Head if persists   - Orders reviewed. Labs for AM ordered, which was adjusted as needed.    Lyndee Hensen, DO 06/28/2021, 2:04 AM PGY-3, [facility] Night Resident  Please page [phone] with questions.

## 2021-06-28 NOTE — Progress Notes (Addendum)
Family Medicine Teaching Service Daily Progress Note Intern Pager: [phone]  Patient name: Briana Sweeney Medical record number: [id number] Date of birth: 01-Nov-1970 Age: 50 y.o. Gender: female  Primary Care Provider: [name], FNP (Inactive) Consultants: GI Code Status: Full  Pt Overview and Major Events to Date:  06/25/2021-admitted 06/26/2021-upper GI endoscopy, 6 bands placed, diagnostic and therapeutic paracentesis  Assessment and Plan: Briana Sweeney is a 50 year old female who presented with hematemesis, melena, hematochezia and was found to have esophageal varices.  PMH is significant for alcohol use disorder, cirrhosis, ascites, seizure disorder, bipolar/schizophrenia. Hgb stable 11.1. diarrhea with blood last night  Esophageal varices GI is following, patient had 6 bands placed on grade 3 large esophageal varices on 06/23/2021.  GI recommends continuing IV octreotide until 06/30/2021 for a total of 7 days.  As patient is tolerating PO, she can be transitioned from IV to PO Protonix. -GI consulted -IV Octreotide 25 mL/hr -Oral pantoprazole 40 mg twice daily -AM CBC -AM CMP  Alcoholic cirrhosis  ascites Patient had diagnostic and therapeutic paracentesis on 06/26/2021 resulting in 1.1 L of straw-colored few fluid with no evidence of SBP.  Yesterday, spironolactone 50 mg was added.  Per GI, this will be increased to 100 mg daily.  Lasix was held yesterday due to patient not appearing to be fluid overloaded.  Patient normally takes 80 mg Lasix at home daily.  This will be decreased to 40 mg daily.  Home medications also include lactulose 20 mg 3 times daily.  Patient does not take rifaximin as a home medication due to cost.  Patient states she had hematochezia last night with diarrhea.  Magnesium of 1.7 and potassium of 3.7 today. -Discontinue ceftriaxone 1 g daily -PO Augmentin twice daily  -PO ciprofloxacin for 2 days twice daily -Spironolactone 100 mg daily -Lasix 40  mg daily -Monitor electrolytes -Magnesium 2 g IV daily -AM CMP   Alcohol abuse Patient has 10-year history of consuming 1 pint of vodka daily.  As needed Ativan was discontinued yesterday due to patient being mildly sedated with oxygen saturations in the high 80s-low 90s while resting. -CIWA monitoring -Librium taper will end tonight  -Thiamine 100 mg oral or IV daily -Folic acid 1 mg daily -Multivitamin 1 tablet daily  Chronic thrombocytopenia Platelets 79 today.  Patient receives Nplate infusions weekly through [facility].  Patient stated yesterday that she would have normally received her infusion on 06/27/2021.  Depression  schizophrenia  bipolar disorder Patient takes Klonopin at home and is asking to restart her Klonopin.   -Continue home Abilify 10 mg daily and Prozac 40 mg daily -Will restart Klonopin tomorrow after Librium taper is complete.  Headache Patient still has constant, sharp headache behind her right eye.  States she had a fall before she came to the hospital. -head CT today -Acetaminophen 325 mg every 6 hours.  Seizure disorder Patient has had seizure disorder since age 18, per patient last seizure was a little over a week ago. -Continue home medication of Keppra 750 mg twice daily  History of hepatitis C HCV RNA quant from 06/25/2021 did not detect HCV   FEN/GI: Soft diet PPx: SCDs Dispo:Home in 2-3 days. Barriers include continued medical management  Subjective:  Patient states she is feeling anxious this morning and is asking for Klonopin.  States she had diarrhea yesterday evening with bright red blood.  Denies vomiting, denies nausea.  Was able to eat soup for dinner last night.  Rates her headache today at a  6/10  Objective: Temp:  [97.8 F (36.6 C)-98.3 F (36.8 C)] 97.8 F (36.6 C) (10/20 0408) Pulse Rate:  [90-109] 90 (10/20 0408) Resp:  [17-23] 20 (10/20 0408) BP: (95-116)/(60-84) 114/75 (10/20 0408) SpO2:  [85 %-97 %] 97 % (10/19  1910) Physical Exam: General: Patient is lying in bed, NAD Cardiovascular: Tachycardic, regular rhythm, soft systolic murmur Respiratory: Mild crackles in right lung base, otherwise clear to auscultation with good air movement Abdomen: Soft, mildly distended, mild tenderness to palpation of lower quadrants, normal bowel sounds Skin: Jaundiced Extremities: Trace bilateral pitting edema of BLEs  Laboratory: Recent Labs  Lab 06/26/21 0459 06/27/21 0319 06/28/21 0319  WBC 4.1 3.1* 3.2*  HGB 11.0* 11.0* 11.1*  HCT 32.2* 31.7* 33.0*  PLT 51* 69* 79*   Recent Labs  Lab 06/26/21 0459 06/27/21 0319 06/27/21 1615 06/28/21 0319  NA 132* 125* 133* 133*  K 4.1 2.2* 3.2* 3.7  CL 89* 79* 91* 92*  CO2 26 32 34* 30  BUN <5* <5* <5* <5*  CREATININE 0.79 0.84 0.74 0.72  CALCIUM 7.0* 6.6* 7.5* 8.0*  PROT 6.5 6.9  --  6.8  BILITOT 8.4* 9.3*  --  10.2*  ALKPHOS 219* 247*  --  237*  ALT 49* 46*  --  41  AST 230* 192*  --  163*  GLUCOSE 91 170* 142* 163Precious Gilding, DO 06/28/2021, 5:28 AM PGY-1, [facility] Intern pager: [phone], text pages welcome

## 2021-06-28 NOTE — Anesthesia Postprocedure Evaluation (Signed)
Anesthesia Post Note  Patient: Tywana Robotham  Procedure(s) Performed: ESOPHAGOGASTRODUODENOSCOPY (EGD) WITH PROPOFOL ESOPHAGEAL BANDING     Patient location during evaluation: PACU Anesthesia Type: General Level of consciousness: awake and alert Pain management: pain level controlled Vital Signs Assessment: post-procedure vital signs reviewed and stable Respiratory status: spontaneous breathing, nonlabored ventilation, respiratory function stable and patient connected to nasal cannula oxygen Cardiovascular status: blood pressure returned to baseline and stable Postop Assessment: no apparent nausea or vomiting Anesthetic complications: no   No notable events documented.  Last Vitals:  Vitals:   06/28/21 1500 06/28/21 1611  BP: 92/66 111/79  Pulse: (!) 111   Resp: 20   Temp: 36.4 C   SpO2: 92%     Last Pain:  Vitals:   06/28/21 1500  TempSrc: Oral  PainSc:                  Nara Paternoster

## 2021-06-28 NOTE — TOC CAGE-AID Note (Signed)
Transition of Care [facility]) - CAGE-AID Screening   Patient Details  Name: Briana Sweeney MRN: [id number] Date of Birth: 03-29-1971  [facility] CM/SW Contact:    Joanne Chars, LCSW Phone Number: 06/28/2021, 3:56 PM   Clinical Narrative: CSW spoke with pt to complete Cage Aid.  Pt reports drinking 6 days per week, one pint vodka per day.  Pt denies drug use.  Pt has not been to substance abuse treatment in the past, has done AA in the past but did not like it.  Pt was able to stop drinking for 6 months about a year ago.  CSW discussed with pt whether her current hospitalization is related to her ETOH use and she stated that it was, that the MD told her if she didn't stop drinking, "I'm going to die."  Pt reports that despite this, treatment is not really an option for her. "I live alone and I have a dog."  CSW provided contact information for several residential programs.  Pt is a current pt at [facility] for outpt mental health treatment and is aware that they can assist with substance use treatment as well.      CAGE-AID Screening:    Have You Ever Felt You Ought to Cut Down on Your Drinking or Drug Use?: Yes Have People Annoyed You By Critizing Your Drinking Or Drug Use?: Yes Have You Felt Bad Or Guilty About Your Drinking Or Drug Use?: No Have You Ever Had a Drink or Used Drugs First Thing In The Morning to Steady Your Nerves or to Get Rid of a Hangover?: Yes CAGE-AID Score: 3  Substance Abuse Education Offered: Yes  Substance abuse interventions: Patient Counseling, Other (must comment) Civil engineer, contracting)

## 2021-06-28 NOTE — Evaluation (Signed)
Occupational Therapy Evaluation Patient Details Name: Briana Sweeney MRN: [id number] DOB: [birth year] Today's Date: 06/28/2021   History of Present Illness Pt is 50 yo female who presented on 06/26/21 with hematemesis, melena, hematochezia.   Pt had upper GI endoscopy on 06/23/21 and found to have large esophageal varices. Pt with hx of EtOH abuse with cirrhosis, ascites, seizure disorder, and bipolar/schizopnrenia   Clinical Impression   PTA patient independent and lives alone. Admitted for above and limited by problem list below, including impaired balance, decreased activity tolerance and generalized weakness. Completing transfers and mobility in room with min guard, up to min guard for ADLs.  Believe she will progress well, will follow acutely with no anticipated OT services after dc home.       Recommendations for follow up therapy are one component of a multi-disciplinary discharge planning process, led by the attending physician.  Recommendations may be updated based on patient status, additional functional criteria and insurance authorization.   Follow Up Recommendations  No OT follow up;Supervision - Intermittent    Equipment Recommendations  Tub/shower seat    Recommendations for Other Services       Precautions / Restrictions Precautions Precautions: Fall Restrictions Weight Bearing Restrictions: No      Mobility Bed Mobility Overal bed mobility: Needs Assistance Bed Mobility: Supine to Sit;Sit to Supine     Supine to sit: Supervision Sit to supine: Supervision   General bed mobility comments: line mgmt and safety    Transfers Overall transfer level: Needs assistance Equipment used: None Transfers: Sit to/from Stand Sit to Stand: Min guard         General transfer comment: min guard for safety and lines    Balance Overall balance assessment: Needs assistance Sitting-balance support: No upper extremity supported;Feet supported Sitting balance-Leahy  Scale: Good     Standing balance support: No upper extremity supported;During functional activity Standing balance-Leahy Scale: Fair Standing balance comment: pt with mild unsteadiness, reaching out for UE support                           ADL either performed or assessed with clinical judgement   ADL Overall ADL's : Needs assistance/impaired     Grooming: Min guard;Standing;Wash/dry hands;Oral care           Upper Body Dressing : Set up;Sitting   Lower Body Dressing: Min guard;Sit to/from stand   Toilet Transfer: Min guard;Ambulation   Toileting- Clothing Manipulation and Hygiene: Min guard;Sit to/from stand       Functional mobility during ADLs: Min guard General ADL Comments: pt limited by weakness, decreased activity tolerance     Vision   Vision Assessment?: No apparent visual deficits     Perception     Praxis      Pertinent Vitals/Pain Pain Assessment: No/denies pain     Hand Dominance Right   Extremity/Trunk Assessment Upper Extremity Assessment Upper Extremity Assessment: Overall WFL for tasks assessed   Lower Extremity Assessment Lower Extremity Assessment: Defer to PT evaluation       Communication Communication Communication: No difficulties   Cognition Arousal/Alertness: Awake/alert Behavior During Therapy: WFL for tasks assessed/performed Overall Cognitive Status: Within Functional Limits for tasks assessed                                     General Comments  VSS on RA  Exercises     Shoulder Instructions      Home Living Family/patient expects to be discharged to:: Private residence Living Arrangements: Alone Available Help at Discharge: Family;Friend(s);Available PRN/intermittently Type of Home: Apartment Home Access: Elevator     Home Layout: One level     Bathroom Shower/Tub: Teacher, early years/pre: Standard     Home Equipment: Grab bars - tub/shower;Grab bars - toilet           Prior Functioning/Environment Level of Independence: Independent        Comments: pt reports independent with ADLs, IADLs (meds, microwave meals), NOT driving (takes medical bus to appointments and walks to grocery store)        OT Problem List: Decreased strength;Decreased activity tolerance;Impaired balance (sitting and/or standing);Decreased safety awareness;Decreased knowledge of use of DME or AE      OT Treatment/Interventions: Therapeutic exercise;Self-care/ADL training;DME and/or AE instruction;Therapeutic activities;Balance training;Patient/family education    OT Goals(Current goals can be found in the care plan section) Acute Rehab OT Goals Patient Stated Goal: return home OT Goal Formulation: With patient Time For Goal Achievement: 07/12/21 Potential to Achieve Goals: Good  OT Frequency: Min 2X/week   Barriers to D/C:            Co-evaluation              AM-PAC OT "6 Clicks" Daily Activity     Outcome Measure Help from another person eating meals?: None Help from another person taking care of personal grooming?: A Little Help from another person toileting, which includes using toliet, bedpan, or urinal?: A Little Help from another person bathing (including washing, rinsing, drying)?: A Little Help from another person to put on and taking off regular upper body clothing?: A Little Help from another person to put on and taking off regular lower body clothing?: A Little 6 Click Score: 19   End of Session Nurse Communication: Mobility status;Other (comment) (requests need for anxiety meds)  Activity Tolerance: Patient tolerated treatment well Patient left: in bed;with call bell/phone within reach;with bed alarm set  OT Visit Diagnosis: Other abnormalities of gait and mobility (R26.89);Muscle weakness (generalized) (M62.81)                Time: 9163-8466 OT Time Calculation (min): 20 min Charges:  OT General Charges $OT Visit: 1 Visit OT  Evaluation $OT Eval Low Complexity: 1 Low  Jolaine Artist, OT Acute Rehabilitation Services Pager [phone] Office [phone]   Delight Stare 06/28/2021, 1:08 PM

## 2021-06-28 NOTE — Progress Notes (Addendum)
Progress Note    ASSESSMENT AND PLAN:    UGI bleed d/t EV s/p band ligation x 6 10/18. Didn't tolerate nadolol in past d/t hypotension Ascites s/p LVP (1 lit) yesterday. 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 Subclinical hepatic encephalopathy   Plan: -Advance diet to soft.  Can advance further tomorrow to low-salt, normal protein diet. -Rest of the recommendations as before.  Continue octreotide for total of 72 hours. -Antibiotics for total of 5 days.  Can switch Rocephin to p.o. Augmentin/Cipro -Trend CBC, electrolytes. -Diuretics: Aldactone/Lasix 100/40 for now.  Monitor weight. -Recommend checking CBC/CMP 1 week after discharge. -Unfortunately, expect bilirubin to rise. -Can transition IV Protonix to p.o. 40 mg p.o. once a day. -Continue lactulose.  Titrate to 2-3 Bms/day -We have not added rifaximin as yet d/t cost/affordability -Follow-up in GI clinic in 3 to 4 weeks.  She would need repeat EVL in 4 to 6 weeks. -Strict alcohol abstinence -Avoid nonsteroidals. -Will sign off for now     SUBJECTIVE   Was upset since she did not get Klonopin Some rectal bleeding this morning-expected.  None since. Hemoglobin has been more or less stable. Tolerating p.o. well. No abdominal pain.    OBJECTIVE:     Vital signs in last 24 hours: Temp:  [97.6 F (36.4 C)-98.3 F (36.8 C)] 97.6 F (36.4 C) (10/20 0746) Pulse Rate:  [88-99] 88 (10/20 0746) Resp:  [17-25] 25 (10/20 0746) BP: (95-116)/(60-84) 106/73 (10/20 0746) SpO2:  [91 %-97 %] 91 % (10/20 0746) Last BM Date: 06/28/21 General:   much Alert today EENT:  Normal hearing, icteric sclera, conjunctive pink.  Heart:  Regular rate and rhythm; no murmur.  No lower extremity edema   Pulm: Normal respiratory effort, lungs CTA bilaterally without wheezes or crackles. Abdomen:  Soft, mildly distended,  nontender.  Normal bowel sounds,.       Neurologic:  Alert and  oriented x4;  grossly normal neurologically.   Intake/Output from previous day: 10/19 0701 - 10/20 0700 In: 62 [P.O.:120; IV Piggyback:450] Out: 301 [Urine:301] Intake/Output this shift: No intake/output data recorded.  Lab Results: Recent Labs    06/26/21 0459 06/27/21 0319 06/28/21 0319  WBC 4.1 3.1* 3.2*  HGB 11.0* 11.0* 11.1*  HCT 32.2* 31.7* 33.0*  PLT 51* 69* 79*   BMET Recent Labs    06/27/21 0319 06/27/21 1615 06/28/21 0319  NA 125* 133* 133*  K 2.2* 3.2* 3.7  CL 79* 91* 92*  CO2 32 34* 30  GLUCOSE 170* 142* 163*  BUN <5* <5* <5*  CREATININE 0.84 0.74 0.72  CALCIUM 6.6* 7.5* 8.0*   LFT Recent Labs    06/28/21 0319  PROT 6.8  ALBUMIN 2.3*  AST 163*  ALT 41  ALKPHOS 237*  BILITOT 10.2*   PT/INR Recent Labs    06/25/21 1501 06/26/21 0600  LABPROT 15.5* 16.6*  INR 1.2 1.3*   Hepatitis Panel No results for input(s): HEPBSAG, HCVAB, HEPAIGM, HEPBIGM in the last 72 hours.  IR Paracentesis  Result Date: 06/26/2021 INDICATION: Patient with history of liver cirrhosis, portal hypertension, esophageal varices with ascites. Request for therapeutic and diagnostic paracentesis EXAM: ULTRASOUND GUIDED THERAPEUTIC AND DIAGNOSTIC PARACENTESIS MEDICATIONS: Lidocaine 1% 10 mL COMPLICATIONS: None immediate. PROCEDURE: Informed written consent was obtained from the patient after a discussion of the risks, benefits and alternatives to treatment. A timeout was performed prior to the initiation of the  procedure. Initial ultrasound scanning demonstrates a small amount of ascites within the left lower abdominal quadrant. The left lower abdomen was prepped and draped in the usual sterile fashion. 1% lidocaine was used for local anesthesia. Following this, a 19 gauge, 10-cm, Yueh catheter was introduced. An ultrasound image was saved for documentation purposes. The paracentesis was performed. The catheter was  removed and a dressing was applied. The patient tolerated the procedure well without immediate post procedural complication. FINDINGS: A total of approximately 1.1 L of straw-colored fluid was removed. Samples were sent to the laboratory as requested by the clinical team. IMPRESSION: Successful ultrasound-guided therapeutic and diagnostic paracentesis yielding 1.1 liters of peritoneal fluid. Read by: Rushie Nyhan, NP Electronically Signed   By: Markus Daft M.D.   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

## 2021-06-28 NOTE — Progress Notes (Signed)
CSW addressed readmission risk items with pt.  She uses medicaid transportation in [facility], also gets rides from friends.  PCP listed as [facility]ect[facility], [facility].  CSW called [facility] and was told that pt has never been seen by [facility] Becton and is not active there. Lurline Idol, MSW, LCSW 10/20/20224:04 PM

## 2021-06-28 NOTE — Progress Notes (Signed)
FPTS Interim Progress Note  S:Paged by RN for patient worsening agitation.  Reports that patient is requesting home Klonipin.   Went to assess patient.  She reports that she is feeling agitated and her head feels empty.  She states that she is withdrawing from her Klonipin and needs this to also help with her seizures. Last seizure 1-2 weeks ago.  She reports that the Librium has not been helping  O: BP (!) 94/55 (BP Location: Left Arm)   Pulse (!) 102   Temp 98.2 F (36.8 C) (Oral)   Resp (!) 21   SpO2 91%    Physical Exam:  General: 50 y.o. female in NAD Neuro: Alert and oriented to person place and time.  PERRLA.  Psych: Mild agitation, speech normal, bilateral upper extremity tremors   A/P:  Agitation: Stable Likely secondary from EtOH withdrawal. Less likely ICB/CVA given no focal findings on neuro exam.  No auditory/visual hallucinations. -Obtain NCT head as previously ordered -Can restart home Klonipin 0.5 mg BID after CT head completed -Monitor for worsening mentation.  Carollee Leitz, MD 06/28/2021, 1:25 PM PGY-3, [place] Medicine Service pager [phone]

## 2021-06-28 NOTE — Progress Notes (Addendum)
Family Medicine Teaching Service Daily Progress Note Intern Pager: [phone]  Patient name: Briana Sweeney Medical record number: [id number] Date of birth: Nov 10, 1970 Age: 50 y.o. Gender: female  Primary Care Provider: [name], FNP (Inactive) Consultants: GI Code Status: Full  Pt Overview and Major Events to Date:  06/25/2021-admitted 06/26/2021-upper GI endoscopy, 6 bands placed, diagnostic and therapeutic paracentesis   Assessment and Plan: Briana Sweeney is a 50 year old female who presented with hematemesis, melena, hematochezia and was found to have esophageal varices.  PMH is significant for alcohol use disorder, cirrhosis, ascites, seizure disorder, bipolar-schizophrenia.    Esophageal varices GI is following, patient had 6 bands placed on grade 3 large esophageal varices on 06/26/2021.  Patient completed 72 hours of IV octreotide last night and was transitioned from IV Protonix to p.o. Protonix yesterday. Did have 3-4 episodes of hematochezia with BMs but less with each. 1 BM today, no blood. Hemoglobin is stable at 12 this morning. -GI consulted -Oral pantoprazole 40 mg daily -AM CBC -Follow-up outpatient with GI in 3 to 4 weeks  Alcoholic cirrhosis  ascites Diagnostic and therapeutic paracentesis from 06/26/2021 resulted in 1.1 L of straw-colored fluid with no evidence of SBP.  Culture of the fluid has been ordered.  Yesterday, patient was transitioned from IV ceftriaxone to p.o. antibiotics. GI recommends continuing antibiotics for total of 5 days, today is day 5.  Magnesium is low this morning at 1.4, potassium is WNL at 3.7. -P.o. Augmentin twice daily, last dose today -P.o. ciprofloxacin twice daily, last dose today -Spironolactone 100 mg daily -Lasix 40 mg daily -Lactulose 20 mg 3 times daily -Monitor electrolytes -Magnesium 2 g IV daily -AM CMP if patient does not discharge today -GI recommends follow-up CBC and CMP 1 week after discharge with  PCP  Alcohol use Patient has 10-year history of consuming 1 pint of vodka daily.  Patient finished Librium taper last night and was started back on her home Klonopin which she takes for anxiety.  Patient stated she was feeling very agitated yesterday and had bilateral hand tremors. Pt would like to begin Naltrexone upon discharge.  -CIWA monitoring -Continue home Klonopin -Thiamine 100 mg oral or IV daily -Folic acid 1 mg daily -Multivitamin 1 tablet daily  Chronic thrombocytopenia Platelets have trended upward from 57 at admission to 95 today.  Patient receives Nplate infusions weekly through [facility].  Patient would have had a infusion on 06/27/2021 per patient.  -Follow-up with oncology outpatient  Headache Patient had been complaining of sharp constant headache behind her eyes since admission.  Patient had head CT yesterday which showed no acute intracranial hemorrhage and no acute intracranial process.  Patient states headache has improved today.  Low Mag EKG this afternoon  Seizure disorder -Continue home medication of Keppra 750 mg twice daily  History of hepatitis C HCVRNA quant from 06/25/2021 did not detect HCV  FEN/GI: Soft diet PPx: SCDs Dispo:Home today.   Subjective:  Patient states she is feeling better this morning.  She still has abdominal pain but states it is better than yesterday rates it at a 5/10.  Still has the same headache she has had since admission but says it is also better and rates it as a 5/10.  She states she is ready to go home.  She had 3-4 bowel movements yesterday which were bloody but she states each 1 has less blood.  She had a bowel movement this morning which was nonbloody.  Objective: Temp:  [97.6 F (36.4 C)-98.3  F (36.8 C)] 98.2 F (36.8 C) (10/20 1200) Pulse Rate:  [88-102] 102 (10/20 1200) Resp:  [17-25] 21 (10/20 1200) BP: (94-116)/(55-84) 94/55 (10/20 1200) SpO2:  [91 %-97 %] 91 % (10/20 1200) Physical Exam: General: Patient lying  in bed, NAD Cardiovascular: Tachycardic, regular rhythm Respiratory: Very mild crackles in bilateral bases, good air movement Abdomen: Soft, very mild distention, very mild tenderness to palpation, normal bowel sounds Extremities: No edema of BLEs  Laboratory: Recent Labs  Lab 06/26/21 0459 06/27/21 0319 06/28/21 0319  WBC 4.1 3.1* 3.2*  HGB 11.0* 11.0* 11.1*  HCT 32.2* 31.7* 33.0*  PLT 51* 69* 79*   Recent Labs  Lab 06/26/21 0459 06/27/21 0319 06/27/21 1615 06/28/21 0319  NA 132* 125* 133* 133*  K 4.1 2.2* 3.2* 3.7  CL 89* 79* 91* 92*  CO2 26 32 34* 30  BUN <5* <5* <5* <5*  CREATININE 0.79 0.84 0.74 0.72  CALCIUM 7.0* 6.6* 7.5* 8.0*  PROT 6.5 6.9  --  6.8  BILITOT 8.4* 9.3*  --  10.2*  ALKPHOS 219* 247*  --  237*  ALT 49* 46*  --  41  AST 230* 192*  --  163*  GLUCOSE 91 170* 142* 163Precious Gilding, DO 06/28/2021, 2:47 PM PGY-1, [place] Intern pager: [phone], text pages welcome

## 2021-06-29 ENCOUNTER — Other Ambulatory Visit (HOSPITAL_COMMUNITY): Payer: Self-pay

## 2021-06-29 ENCOUNTER — Other Ambulatory Visit: Payer: Self-pay | Admitting: Student

## 2021-06-29 DIAGNOSIS — K7030 Alcoholic cirrhosis of liver without ascites: Secondary | ICD-10-CM

## 2021-06-29 DIAGNOSIS — F1020 Alcohol dependence, uncomplicated: Secondary | ICD-10-CM | POA: Diagnosis not present

## 2021-06-29 DIAGNOSIS — B1920 Unspecified viral hepatitis C without hepatic coma: Secondary | ICD-10-CM

## 2021-06-29 DIAGNOSIS — K7031 Alcoholic cirrhosis of liver with ascites: Secondary | ICD-10-CM

## 2021-06-29 DIAGNOSIS — I8500 Esophageal varices without bleeding: Secondary | ICD-10-CM

## 2021-06-29 DIAGNOSIS — K920 Hematemesis: Secondary | ICD-10-CM | POA: Diagnosis not present

## 2021-06-29 LAB — BASIC METABOLIC PANEL
Anion gap: 8 (ref 5–15)
BUN: <5 mg/dL — ABNORMAL LOW (ref 6–20)
CO2: 31 mmol/L (ref 22–32)
Calcium: 8.7 mg/dL — ABNORMAL LOW (ref 8.9–10.3)
Chloride: 94 mmol/L — ABNORMAL LOW (ref 98–111)
Creatinine, Ser: 0.79 mg/dL (ref 0.44–1.00)
GFR, Estimated: >60 mL/min (ref >60)
Glucose, Bld: 111 mg/dL — ABNORMAL HIGH (ref 70–99)
Potassium: 3.7 mmol/L (ref 3.5–5.1)
Sodium: 133 mmol/L — ABNORMAL LOW (ref 135–145)

## 2021-06-29 LAB — CBC
HCT: 35 % — ABNORMAL LOW (ref 36.0–46.0)
Hemoglobin: 12 g/dL (ref 12.0–15.0)
MCH: 31.2 pg (ref 26.0–34.0)
MCHC: 34.3 g/dL (ref 30.0–36.0)
MCV: 90.9 fL (ref 80.0–100.0)
Platelets: 95 10*3/uL — ABNORMAL LOW (ref 150–400)
RBC: 3.85 MIL/uL — ABNORMAL LOW (ref 3.87–5.11)
RDW: 21 % — ABNORMAL HIGH (ref 11.5–15.5)
WBC: 3.5 10*3/uL — ABNORMAL LOW (ref 4.0–10.5)
nRBC: 0 % (ref 0.0–0.2)

## 2021-06-29 LAB — MAGNESIUM: Magnesium: 1.4 mg/dL — ABNORMAL LOW (ref 1.7–2.4)

## 2021-06-29 MED ORDER — FUROSEMIDE 40 MG PO TABS
40.0000 mg | ORAL_TABLET | Freq: Every day | ORAL | 0 refills | Status: DC
  Filled 2021-06-29: qty 30, 30d supply, fill #0

## 2021-06-29 MED ORDER — PANTOPRAZOLE SODIUM 40 MG PO TBEC
40.0000 mg | DELAYED_RELEASE_TABLET | Freq: Every day | ORAL | 0 refills | Status: DC
  Filled 2021-06-29: qty 30, 30d supply, fill #0

## 2021-06-29 MED ORDER — AMOXICILLIN-POT CLAVULANATE 875-125 MG PO TABS
1.0000 | ORAL_TABLET | Freq: Two times a day (BID) | ORAL | Status: AC
  Administered 2021-06-29: 1 via ORAL
  Filled 2021-06-29: qty 1

## 2021-06-29 MED ORDER — SPIRONOLACTONE 100 MG PO TABS
100.0000 mg | ORAL_TABLET | Freq: Every day | ORAL | 0 refills | Status: DC
  Filled 2021-06-29: qty 30, 30d supply, fill #0

## 2021-06-29 MED ORDER — MAGNESIUM SULFATE 4 GM/100ML IV SOLN
4.0000 g | Freq: Every day | INTRAVENOUS | Status: AC
  Administered 2021-06-29: 4 g via INTRAVENOUS
  Filled 2021-06-29: qty 100

## 2021-06-29 MED ORDER — NALTREXONE HCL 50 MG PO TABS
50.0000 mg | ORAL_TABLET | Freq: Every day | ORAL | 0 refills | Status: DC
  Filled 2021-06-29: qty 30, 30d supply, fill #0

## 2021-06-29 MED ORDER — CIPROFLOXACIN HCL 500 MG PO TABS
500.0000 mg | ORAL_TABLET | Freq: Two times a day (BID) | ORAL | Status: AC
  Administered 2021-06-29: 500 mg via ORAL
  Filled 2021-06-29: qty 1

## 2021-06-29 NOTE — Discharge Instructions (Addendum)
Thank you for letting us care for you during your stay. You were admitted to the [facility] Medicine Teaching Service because you were vomiting blood and passing blood with your stool.  You were found to have very enlarged veins in your esophagus which can bleed.  The gastrointestinal doctors put bands around them to prevent bleeding.  It is very important that you follow-up with the gastrointestinal doctors in 3 to 4 weeks.   Some of your electrolytes were low during your stay and they were repleted.  It will be important for you to follow-up with your primary care physician within 1 week of discharge to check your electrolytes and blood counts.  Please follow up with your primary care physician in 1 week and with gastroenterology in 3 to 4 weeks.  Some of your medications were changed and some new medications were added: spironolactone 100 mg daily was added to your regimen Your home Lasix was decreased to 40 mg daily Pantoprazole 40 mg daily was added to your regimen Naltrexone 50mg  daily was added to your regimen  The most important thing you can do for your health going forward is to not drink any alcohol.   If your symptoms worsen or return, please return to the hospital.  Please let us know if you have questions about your stay at [facility].

## 2021-06-29 NOTE — Discharge Summary (Signed)
[facility] Discharge Summary  Patient name: Briana Sweeney Medical record number: [id number] Date of birth: November 01, 1970 Age: 50 y.o. 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   Abdominal ascites   Alcohol use disorder, severe, dependence (HCC)   Thrombocytopenia (HCC)   Seizure (HCC)   Hepatitis C   Encephalopathy, portal systemic   Substance induced mood disorder (HCC)   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HCC)   Alcohol withdrawal ([place])   Secondary esophageal varices with bleeding ([place])   Disposition: Home  Discharge Condition: Stable  Discharge Exam:  General: Patient lying in bed, NAD Cardiovascular: Tachycardic, regular rhythm Respiratory: Very mild crackles in bilateral bases, good air movement Abdomen: Soft, very mild distention, very mild tenderness to palpation, normal bowel sounds Extremities: No edema of BLEs    Brief Hospital Course:  Briana Sweeney is a 50 year old female presenting with hematemesis, melena, hematochezia.  PMH is significant for alcohol use disorder, cirrhosis, ascites, seizure disorder, and bipolar/schizophrenia  Esophageal varices  alcoholic cirrhosis  ascites Patient first presented to [facility] ED on 06/22/2021 due to hematemesis, melena, and hematochezia. CT of abdomen pelvis from 06/22/2021 showed advanced hepatic steatosis and cirrhosis with evidence of portal hypertension, large esophageal varices, and small volume ascites. Patient was discharged from [facility] ED on 06/23/2021 as they do not have a GI service and came to [facility] ED to  have further evaluation.   In [facility] ED, patient was tachycardic but remainder of vitals stable. Hgb 11.6 which is unchanged from prior value on 10/14 per review of records from [facility]. In the ED she had a positive FOBT, lactic acid of 2.8, ALT 57, AST of 266, alk phos of 267, total bili of 8, albumin of 2.9, INR of 1.2, PTT of 15.5, ammonia of 71.   Patient had significant abdominal ascites, palmar erythema, and bilateral lower pitting extremity edema.  Patient was treated with IV octreotide and IV pantoprazole for esophageal varices and with IV ceftriaxone for SBP prophylaxis.    She had upper GI endoscopy on 06/23/2021 and was found to have grade 3 and large esophageal varices.  6 bands were placed.  She was also found to have mild portal hypertensive gastropathy and duodenopathy.  Patient completed 72 hours of IV octreotide.  Completed 72 hours of  PPI theray. Patient was transitioned to PO Augmentin and ciprofloxacin which she completed prior to discharge.  She was discharged on pantoprazole 40 mg daily. GI recommends follow-up in 3 to 4 weeks repeat upper endoscopy in 4 to 6 weeks.   Patient had diagnostic and therapeutic paracentesis on 06/26/2021 resulting in 1.1 L of straw-colored few fluid with no evidence of SBP.  Culture pending at time of discharge. Low suspicion for SBP. No albumin given per discussion with Gastroenterology. For her chronic cirrhosis and ascites, spironolactone 100 mg was added to her home regimen and her Lasix was decreased to 40 mg daily.  She remained on her home lactulose 20 mg 3 times daily.  Her electrolytes were monitored and repleted as necessary.  Alcohol withdrawal Patient states she has 10-year history of drinking 1 pint of vodka per day.  Patient was treated with Librium taper and as  needed Ativan for withdrawal symptoms.  Patient was discharged with naltrexone.  Chronic thrombocytopenia Platelets trended from 57 at admission to 95 on day of discharge.  Patient  receives Nplate infusions weekly through [facility] and would have had an infusion on 06/27/2021 per patient.  She did not receive any infusions during her stay.  Headache Patient had a sharp constant headache behind her as during her stay.  Patient had a head CT which showed no acute intracranial hemorrhage and no acute intracranial process.  Headache slightly improved upon discharge  Issues for follow up: Follow-up on CBC, CMP, and magnesium within 1 week from discharge Follow-up with oncologist for Nplate infusions Ensure follow-up with GI in 3 to 4 weeks after discharge Recommend alcohol cessation and continued discussion of severity of alcoholic liver disease   Significant Procedures: Upper GI endoscopy, therapeutic and diagnostic paracentesis  Significant Labs and Imaging:  Recent Labs  Lab 06/27/21 0319 06/28/21 0319 06/29/21 0236  WBC 3.1* 3.2* 3.5*  HGB 11.0* 11.1* 12.0  HCT 31.7* 33.0* 35.0*  PLT 69* 79* 95*   Recent Labs  Lab 06/25/21 1032 06/26/21 0459 06/27/21 0319 06/27/21 1615 06/28/21 0319 06/29/21 0236  NA 135 132* 125* 133* 133* 133*  K 3.1* 4.1 2.2* 3.2* 3.7 3.7  CL 96* 89* 79* 91* 92* 94*  CO2 23 26 32 34* 30 31  GLUCOSE 100* 91 170* 142* 163* 111*  BUN <5* <5* <5* <5* <5* <5*  CREATININE 0.70 0.79 0.84 0.74 0.72 0.79  CALCIUM 8.5* 7.0* 6.6* 7.5* 8.0* 8.7*  MG  --  1.1* 1.6*  --  1.7 1.4*  ALKPHOS 267* 219* 247*  --  237*  --   AST 266* 230* 192*  --  163*  --   ALT 57* 49* 46*  --  41  --   ALBUMIN 2.9* 2.5* 2.6*  --  2.3*  --     Results/Tests Pending at Time of Discharge: Culture of ascites fluid from paracentesis  Discharge Medications:  Allergies as of 06/29/2021       Reactions   Other Other (See Comments)   Platelets: Rx chest pain, tremors, body aches        Medication List     STOP taking these medications    chlordiazePOXIDE 25 MG capsule Commonly known as: LIBRIUM       TAKE these medications    ARIPiprazole 10 MG  tablet Commonly known as: ABILIFY Take 1 tablet (10 mg total) by mouth daily.   clonazePAM 0.5 MG tablet Commonly known as: KLONOPIN Take 1 tablet (0.5 mg total) by mouth 2 (two) times daily.   diclofenac Sodium 1 % Gel Commonly known as: VOLTAREN Apply 4 g topically 4 (four) times daily. What changed:  when to take this reasons to take this   FLUoxetine 40 MG capsule Commonly known as: PROZAC Take 1 capsule (40 mg total) by mouth daily.   furosemide 40 MG tablet Commonly known as: LASIX Take 1 tablet (40 mg total) by mouth daily. Start taking on: June 30, 2021 What changed:  medication strength how much to take   gabapentin 300 MG capsule Commonly known as: NEURONTIN TAKE 2 CAPSULES BY MOUTH 3 TIMES DAILY.  **NEED OFFICE VISIT FOR FURTHER REFILLS**   lactulose 10 GM/15ML solution Commonly known as: CHRONULAC Take 30 mLs (20 g total) by mouth 3 (three) times daily.   levETIRAcetam 750 MG tablet Commonly known as: KEPPRA Take 1 tablet (750 mg total) by mouth 2 (  two) times daily.   naltrexone 50 MG tablet Commonly known as: DEPADE Take 1 tablet (50 mg total) by mouth daily.   pantoprazole 40 MG tablet Commonly known as: PROTONIX Take 1 tablet (40 mg total) by mouth daily.   spironolactone 100 MG tablet Commonly known as: ALDACTONE Take 1 tablet (100 mg total) by mouth daily. Start taking on: June 30, 2021        Discharge Instructions: Please refer to Patient Instructions section of EMR for full details.  Patient was counseled important signs and symptoms that should prompt return to medical care, changes in medications, dietary instructions, activity restrictions, and follow up appointments.   Follow-Up Appointments:  Follow-up Information     Gupta, Rajesh, MD. Schedule an appointment as soon as possible for a visit in 3 week(s).   Specialties: Gastroenterology, Internal Medicine Contact information: [address] [place]  [place] [address] [phone]         Stroud, Natalie M, FNP. Go on 07/19/2021.   Specialty: Family Medicine Why: Appointment time: 2:00pm. Please arrive 20 MINUTES EARLY. If you do not show for this appointment/call if you will not show, you will be unable to schedule another appointment with this provider. Contact information: [address] [place] [address] [phone]         Stroud, Natalie M, FNP. Go on 07/12/2021.   Specialty: Family Medicine Why: Lab appointment time: 9:10am. Contact information: [address] [place] [address] [phone]                Sarah Jones DO   MD   

## 2021-06-29 NOTE — Progress Notes (Signed)
Went to the patient's room to ask if the [facility] had delivered her medications. Patient not in room or bathroom. Asked the staff if anybody had seen the patient and per staff in the nurses station nobody has seen the patient leave. Called the [facility] pharmacy to confirm that patient's medication was delivered. Per them patient's medication was delivered. This nurse was suppose to call a uber ride for her once she got her medication. Patient left unknowingly at this time.

## 2021-06-29 NOTE — Progress Notes (Signed)
CSW spoke with [employer].  Pt is active with them, they can set up ride when pt is ready.  RN call [phone] when pt ready. Call before [place], MSW, LCSW 10/21/20224:07 PM

## 2021-06-29 NOTE — Progress Notes (Signed)
Pulse oxymetry with ambulation: Pulse oxymetry at rest on RA- 90%, HR- 101 Pulse oxymetry with ambulation on RA- 93%, HR- 124. MD made aware.

## 2021-06-29 NOTE — Progress Notes (Addendum)
FPTS Brief Progress Note  S:Pt resting comfortably.    O: BP 97/64 (BP Location: Left Arm)   Pulse 97   Temp 98.4 F (36.9 C) (Oral)   Resp 19   SpO2 90%     A/P: VSS. CT Head was normal.  - Orders reviewed. Labs for AM ordered, which was adjusted as needed.    Lyndee Hensen, DO 06/29/2021, 5:32 AM PGY-3, [facility] Night Resident  Please page [phone] with questions.

## 2021-06-29 NOTE — Progress Notes (Deleted)
Pulse oxymetry with ambulation: Pulse oxymetry at rest on RA- 90% Pulse oxymetry with ambulation on RA- 93%

## 2021-06-29 NOTE — Progress Notes (Signed)
Ordered CBC, CMP, Mg to be drawn at lab appointment on 11/3 at 9:10am.

## 2021-06-29 NOTE — Progress Notes (Signed)
Discharge instruction given to the patient and she stated understanding. Questions and concerns answered. VS checked and within normal limit. IV and cardiac monitor discontinued. Patient waiting for new meds to delivered from [facility] and for transportation.

## 2021-06-29 NOTE — Progress Notes (Addendum)
Physical Therapy Treatment Patient Details Name: [name] MRN: [id number] DOB: [birth year] Today's Date: 06/29/2021   History of Present Illness Pt is 51 yo female who presented on 06/26/21 with hematemesis, melena, hematochezia.   Pt had upper GI endoscopy on 06/23/21 and found to have large esophageal varices. Pt with hx of EtOH abuse with cirrhosis, ascites, seizure disorder, and bipolar/schizopnrenia    PT Comments    Pt demonstrates independence with bed mobility. Supervision provided for transfers and ambulation 29' without AD. Slow, steady gait noted. Pt reports she feels like she is walking better. Declining hallway ambulation. Able to maintain SpO2 > 90% on RA. Pt returned to bed at end of session. Current POC remains appropriate.    Recommendations for follow up therapy are one component of a multi-disciplinary discharge planning process, led by the attending physician.  Recommendations may be updated based on patient status, additional functional criteria and insurance authorization.  Follow Up Recommendations  Supervision - Intermittent;No PT follow up     Equipment Recommendations  None recommended by PT    Recommendations for Other Services       Precautions / Restrictions Precautions Precautions: Fall     Mobility  Bed Mobility Overal bed mobility: Modified Independent                  Transfers Overall transfer level: Needs assistance Equipment used: None Transfers: Sit to/from Stand Sit to Stand: Supervision         General transfer comment: supervision for safety  Ambulation/Gait Ambulation/Gait assistance: Supervision Gait Distance (Feet): 75 Feet Assistive device: None Gait Pattern/deviations: Step-through pattern;Decreased stride length Gait velocity: decreased Gait velocity interpretation: 1.31 - 2.62 ft/sec, indicative of limited community ambulator General Gait Details: slow, steady gait; supervision for safety, pt declining  hallway ambulation   Stairs             Wheelchair Mobility    Modified Rankin (Stroke Patients Only)       Balance Overall balance assessment: Needs assistance Sitting-balance support: No upper extremity supported;Feet supported Sitting balance-Leahy Scale: Good     Standing balance support: No upper extremity supported;During functional activity Standing balance-Leahy Scale: Good Standing balance comment: amb without AD, higher level balance deficits                            Cognition Arousal/Alertness: Awake/alert Behavior During Therapy: WFL for tasks assessed/performed;Flat affect Overall Cognitive Status: Within Functional Limits for tasks assessed                                        Exercises      General Comments General comments (skin integrity, edema, etc.): VSS on RA. Able to maintain SpO2 > 90%.      Pertinent Vitals/Pain Pain Assessment: No/denies pain    Home Living                      Prior Function            PT Goals (current goals can now be found in the care plan section) Acute Rehab PT Goals Patient Stated Goal: home to her dog Progress towards PT goals: Progressing toward goals    Frequency    Min 3X/week      PT Plan Current plan remains appropriate    Co-evaluation  AM-PAC PT "6 Clicks" Mobility   Outcome Measure  Help needed turning from your back to your side while in a flat bed without using bedrails?: None Help needed moving from lying on your back to sitting on the side of a flat bed without using bedrails?: None Help needed moving to and from a bed to a chair (including a wheelchair)?: A Little Help needed standing up from a chair using your arms (e.g., wheelchair or bedside chair)?: A Little Help needed to walk in hospital room?: A Little Help needed climbing 3-5 steps with a railing? : A Little 6 Click Score: 20    End of Session   Activity  Tolerance: Patient tolerated treatment well Patient left: in bed;with call bell/phone within reach Nurse Communication: Mobility status PT Visit Diagnosis: Other abnormalities of gait and mobility (R26.89);Muscle weakness (generalized) (M62.81)     Time: 6295-2841 PT Time Calculation (min) (ACUTE ONLY): 12 min  Charges:  $Gait Training: 8-22 mins                     Lorrin Goodell, PT  Office # [phone] Pager [phone]    Lorriane Shire 06/29/2021, 11:58 AM

## 2021-06-29 NOTE — Plan of Care (Signed)

## 2021-07-02 ENCOUNTER — Inpatient Hospital Stay: Payer: Medicaid Other

## 2021-07-02 ENCOUNTER — Other Ambulatory Visit (HOSPITAL_COMMUNITY): Payer: Self-pay

## 2021-07-02 ENCOUNTER — Other Ambulatory Visit: Payer: Self-pay

## 2021-07-02 DIAGNOSIS — D696 Thrombocytopenia, unspecified: Secondary | ICD-10-CM

## 2021-07-03 ENCOUNTER — Encounter: Payer: Self-pay | Admitting: Oncology

## 2021-07-03 ENCOUNTER — Other Ambulatory Visit (HOSPITAL_COMMUNITY): Payer: Self-pay

## 2021-07-03 LAB — CULTURE, BODY FLUID W GRAM STAIN -BOTTLE: Culture: NO GROWTH

## 2021-07-03 NOTE — Addendum Note (Signed)
Addended by: Juanetta Beets on: 07/03/2021 03:03 PM   Modules accepted: Orders

## 2021-07-04 ENCOUNTER — Inpatient Hospital Stay: Payer: Medicaid Other

## 2021-07-04 ENCOUNTER — Other Ambulatory Visit: Payer: Self-pay

## 2021-07-04 VITALS — BP 107/77 | HR 102 | Temp 98.4°F | Resp 18 | Ht 60.0 in | Wt 137.0 lb

## 2021-07-04 DIAGNOSIS — R161 Splenomegaly, not elsewhere classified: Secondary | ICD-10-CM | POA: Diagnosis not present

## 2021-07-04 DIAGNOSIS — D696 Thrombocytopenia, unspecified: Secondary | ICD-10-CM

## 2021-07-04 DIAGNOSIS — D693 Immune thrombocytopenic purpura: Secondary | ICD-10-CM | POA: Diagnosis not present

## 2021-07-04 DIAGNOSIS — K7030 Alcoholic cirrhosis of liver without ascites: Secondary | ICD-10-CM | POA: Diagnosis not present

## 2021-07-04 MED ORDER — ROMIPLOSTIM 250 MCG ~~LOC~~ SOLR
3.8000 ug/kg | Freq: Once | SUBCUTANEOUS | Status: AC
  Administered 2021-07-04: 250 ug via SUBCUTANEOUS
  Filled 2021-07-04: qty 0.5

## 2021-07-04 NOTE — Patient Instructions (Signed)
Romiplostim injection What is this medication? ROMIPLOSTIM (roe mi PLOE stim) helps your body make more platelets. This medicine is used to treat low platelets caused by chronic idiopathic thrombocytopenic purpura (ITP) or a bone marrow syndrome caused by radiation sickness. This medicine may be used for other purposes; ask your health care provider or pharmacist if you have questions. COMMON BRAND NAME(S): Nplate What should I tell my care team before I take this medication? They need to know if you have any of these conditions: blood clots myelodysplastic syndrome an unusual or allergic reaction to romiplostim, mannitol, other medicines, foods, dyes, or preservatives pregnant or trying to get pregnant breast-feeding How should I use this medication? This medicine is injected under the skin. It is given by a health care provider in a hospital or clinic setting. A special MedGuide will be given to you before each treatment. Be sure to read this information carefully each time. Talk to your health care provider about the use of this medicine in children. While it may be prescribed for children as young as newborns for selected conditions, precautions do apply. Overdosage: If you think you have taken too much of this medicine contact a poison control center or emergency room at once. NOTE: This medicine is only for you. Do not share this medicine with others. What if I miss a dose? Keep appointments for follow-up doses. It is important not to miss your dose. Call your health care provider if you are unable to keep an appointment. What may interact with this medication? Interactions are not expected. This list may not describe all possible interactions. Give your health care provider a list of all the medicines, herbs, non-prescription drugs, or dietary supplements you use. Also tell them if you smoke, drink alcohol, or use illegal drugs. Some items may interact with your medicine. What should I  watch for while using this medication? Visit your health care provider for regular checks on your progress. You may need blood work done while you are taking this medicine. Your condition will be monitored carefully while you are receiving this medicine. It is important not to miss any appointments. What side effects may I notice from receiving this medication? Side effects that you should report to your doctor or health care professional as soon as possible: allergic reactions (skin rash, itching or hives; swelling of the face, lips, or tongue) bleeding (bloody or black, tarry stools; red or dark brown urine; spitting up blood or brown material that looks like coffee grounds; red spots on the skin; unusual bruising or bleeding from the eyes, gums, or nose) blood clot (chest pain; shortness of breath; pain, swelling, or warmth in the leg) stroke (changes in vision; confusion; trouble speaking or understanding; severe headaches; sudden numbness or weakness of the face, arm or leg; trouble walking; dizziness; loss of balance or coordination) Side effects that usually do not require medical attention (report to your doctor or health care professional if they continue or are bothersome): diarrhea dizziness headache joint pain muscle pain stomach pain trouble sleeping This list may not describe all possible side effects. Call your doctor for medical advice about side effects. You may report side effects to FDA at 1-800-FDA-1088. Where should I keep my medication? This medicine is given in a hospital or clinic. It will not be stored at home. NOTE: This sheet is a summary. It may not cover all possible information. If you have questions about this medicine, talk to your doctor, pharmacist, or health care provider.    2022 Elsevier/Gold Standard (2019-10-11 10:28:13)  

## 2021-07-05 ENCOUNTER — Encounter: Payer: Self-pay | Admitting: Oncology

## 2021-07-05 ENCOUNTER — Other Ambulatory Visit (HOSPITAL_COMMUNITY): Payer: Self-pay

## 2021-07-06 ENCOUNTER — Other Ambulatory Visit: Payer: Medicaid Other

## 2021-07-06 ENCOUNTER — Encounter: Payer: Self-pay | Admitting: Neurology

## 2021-07-06 ENCOUNTER — Other Ambulatory Visit (HOSPITAL_COMMUNITY): Payer: Self-pay

## 2021-07-06 ENCOUNTER — Other Ambulatory Visit: Payer: Self-pay

## 2021-07-06 ENCOUNTER — Ambulatory Visit: Payer: Medicaid Other | Admitting: Neurology

## 2021-07-06 VITALS — BP 127/87 | HR 98 | Ht 60.0 in | Wt 137.6 lb

## 2021-07-06 DIAGNOSIS — G40309 Generalized idiopathic epilepsy and epileptic syndromes, not intractable, without status epilepticus: Secondary | ICD-10-CM | POA: Diagnosis not present

## 2021-07-06 MED ORDER — LEVETIRACETAM 750 MG PO TABS
750.0000 mg | ORAL_TABLET | Freq: Two times a day (BID) | ORAL | 3 refills | Status: DC
  Filled 2021-07-06: qty 180, 90d supply, fill #0

## 2021-07-06 NOTE — Patient Instructions (Signed)
Continue Keppra 750mg  twice a day  2. Continue follow-up with your PCP, GI specialist, and Psychiatrist  3. Follow-up in 6 months, call for any changes   Seizure Precautions: 1. If medication has been prescribed for you to prevent seizures, take it exactly as directed.  Do not stop taking the medicine without talking to your doctor first, even if you have not had a seizure in a long time.   2. Avoid activities in which a seizure would cause danger to yourself or to others.  Don't operate dangerous machinery, swim alone, or climb in high or dangerous places, such as on ladders, roofs, or girders.  Do not drive unless your doctor says you may.  3. If you have any warning that you may have a seizure, lay down in a safe place where you can't hurt yourself.    4.  No driving for 6 months from last seizure, as per [facility].   Please refer to the following link on the [place] website for more information: http://www.epilepsyfoundation.org/answerplace/Social/driving/drivingu.cfm   5.  Maintain good sleep hygiene. Avoid alcohol.  6.  Contact your doctor if you have any problems that may be related to the medicine you are taking.  7.  Call 911 and bring the patient back to the ED if:        A.  The seizure lasts longer than 5 minutes.       B.  The patient doesn't awaken shortly after the seizure  C.  The patient has new problems such as difficulty seeing, speaking or moving  D.  The patient was injured during the seizure  E.  The patient has a temperature over 102 F (39C)  F.  The patient vomited and now is having trouble breathing

## 2021-07-06 NOTE — Progress Notes (Signed)
NEUROLOGY FOLLOW UP OFFICE NOTE  Briana Sweeney [id number] 05-04-1971  HISTORY OF PRESENT ILLNESS: I had the pleasure of seeing Briana Sweeney in follow-up in the neurology clinic on 07/06/2021.  The patient was last seen 5 months ago for epilepsy suggestive of primary generalized epilepsy.She is alone in the office today. Records and images were personally reviewed where available.  She was admitted for pancreatitis in 03/2021, and most recently 06/25/21 for GI bleed. She reported drinking 1 pint of vodka daily, however today states she has not had any alcohol since her [birth year] on [birth year]. She had a seizure last month, she fell and had a seizure, hitting her head and requiring stitches. Her EEG in 01/2021 was normal, she is on Levetiracetam 750mg  BID without side effects. She is also on Gabapentin 600mg  TID for nerve damage in her feet. She sees Psychiatry and takes clonazepam 0.5mg  BID. She is still feeling weak since recent hospital discharge and notes blood in her urine. She lives alone, she has not been told of any staring episodes. She is not sleeping well, getting around 4 hours of sleep. She has cramps in her legs and feet.    History on Initial Assessment 11/09/2019: This is a pleasant 50 year old right-handed woman with a history of cirrhosis secondary to alcohol abuse and hepatitis C, schizophrenia, anxiety, presenting for evaluation of seizures. She has not seen a neurologist in many years, her last neurologist was in [place]. Seizures started around age 33 or 70 where she would have "silent seizures." She would start feeling nervous with hot/cold flashes. Her eyes would just go blind and dilated after, she would feel weird for 2 days. She has had a couple of convulsions in the past 5 years, last was in July 2020. She lives alone and friends have mentioned staring spells. She has occasional twitching and may drop something in her hand. She used to have nocturnal seizures and wake up with a bloody  pillow. She had been on Phenytoin for many years but continued to have seizures once a month. Phenytoin would interact with hepatitis C medication, so she was switched to Levetiracetam 500mg  BID last October  2020. She denies any seizures since starting LEV. She denies any olfactory/gustatory hallucinations, rising epigastric sensation, focal numbness/tingling/weakness. She used to have headaches but these have decreased. She gets dizzy when her platelet count goes low. She has occasional neck/back pain, occasional numbness in both hands. No diplopia, dysarthria/dysphagia, bowel/bladder dysfunction. Her right foot is in a boot s/p bunion removal. 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 She recalls a fall 4 months ago when her platelets went down and she fell forward, hitting her face. She states she quit drinking 4 months ago. Of note, there is an ER visit on 11/01/19 where she reported last drink was day prior. She states her memory is "half/half." Sleep is good.   Epilepsy Risk Factors:  She had febrile seizures in childhood. Otherwise she had a normal birth and early development.  There is no history of CNS infections such as meningitis/encephalitis, significant traumatic brain injury, neurosurgical procedures, or family history of seizures.  Diagnostic Data: MRI brain without contrast done 03/2019 no acute changes, there was brain atrophy, premature for age, mild T1 hyperintensity in the basal ganglia likely related to chronic alcohol use.  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[name]) 12/05/2018   Ascites due to alcoholic cirrhosis ([place])    Bipolar affective disorder ([place])    With anxiety features   Bunionette of right foot 11/2019   Cirrhosis of liver ([place])    Due to alcohol and hepatitis C   Cocaine dependence without complication ([place]) 94/70/9628   Decompensated hepatic cirrhosis ([place]) 07/23/2017   Depression with anxiety    Epistaxis 01/20/2019   Erysipelas 09/13/2018   Esophageal 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 (HCC)    Esophageal varices without bleeding (HCC)    ETOHism (HCC)    GERD (gastroesophageal reflux disease)    Head injury    Hematemesis 02/10/2018   Hepatitis C 2018   hepatitis c and alcohol related hepatitis   Heroin abuse ([place])    History of blood transfusion    "blood doesn't clot; I fell down and had to have a transfusion"   History of kidney stones    Menopause 2016   Migraine    "when I get really stressed" (09/01/2017)   Neuropathy 10/27/2018   Olecranon bursitis of left elbow    Other mixed anxiety disorders 10/27/2018   Overdose 08/22/2018   Pancytopenia ([place]) 07/23/2017   Polysubstance abuse ([place]) 04/08/2018   Portal hypertension ([place]) 03/18/2018   S/P foot surgery, right 11/2019   Schizophrenia ([place])    Seizures ([place])    "when I run out of my RX; lots recently" (09/01/2017)   Suicidal ideation    Thrombocytopenia ([place])    Trichimoniasis 10/30/2017   Upper GI bleed 02/10/2018   Urinary urgency 05/2020   UTI (urinary tract infection) 06/02/2017    MEDICATIONS: Outpatient Encounter Medications as of 07/06/2021  Medication Sig   ARIPiprazole (ABILIFY) 10 MG tablet Take 1 tablet (10 mg total) by mouth daily.   clonazePAM (KLONOPIN) 0.5 MG tablet Take 1 tablet (0.5 mg total) by mouth 2 (two) times daily.   diclofenac Sodium (VOLTAREN) 1 % GEL Apply 4 g topically 4 (four) times daily. (Patient taking differently: Apply 4  g topically 4 (four) times daily as needed (foot pain).)   FLUoxetine (PROZAC) 40 MG capsule Take 1 capsule (40 mg total) by mouth daily.   furosemide (LASIX) 40 MG tablet Take 1 tablet (40 mg total) by mouth daily.   gabapentin (NEURONTIN) 300 MG capsule TAKE 2 CAPSULES BY MOUTH 3 TIMES DAILY.     lactulose (CHRONULAC) 10 GM/15ML solution Take 30 mLs (20 g total) by mouth 3 (three) times daily.   naltrexone (DEPADE) 50 MG tablet Take 1 tablet (50 mg total) by mouth daily.   pantoprazole (PROTONIX) 40 MG tablet Take 1 tablet (40 mg total) by mouth daily.   spironolactone (ALDACTONE) 100 MG tablet Take 1 tablet (100 mg total) by mouth daily.   [DISCONTINUED] levETIRAcetam (KEPPRA) 750 MG tablet Take 1 tablet (750 mg total) by mouth 2 (two) times daily.   levETIRAcetam (KEPPRA) 750 MG tablet Take 1 tablet (750 mg total) by mouth 2 (two) times daily.   No facility-administered encounter medications on file as of 07/06/2021.      ALLERGIES: Allergies  Allergen Reactions   Other  Other (See Comments)    Platelets: Rx chest pain, tremors, body aches    FAMILY HISTORY: Family History  Problem Relation Age of Onset   Lung cancer Mother 71   Alcohol abuse Mother    Throat cancer Father 26    SOCIAL HISTORY: Social History   Socioeconomic History   Marital status: Divorced    Spouse name: Not on file   Number of children: 2   Years of education: Not on file   Highest education level: Not on file  Occupational History   Occupation: applying for disability  Tobacco Use   Smoking status: Never   Smokeless tobacco: Never  Vaping Use   Vaping Use: Never used  Substance and Sexual Activity   Alcohol use: Not Currently    Alcohol/week: 6.0 standard drinks    Types: 6 Cans of beer per week    Comment: weekly   Drug use: Not Currently   Sexual activity: Not on file  Other Topics Concern   Not on file  Social History Narrative   She moved with a boyfriend to [place] and was  followed at [facility].  He died of a massive heart attack in 8/18, per her report, and so she moved back to [place] and is living with a friend.      Right handed   2 story home    Lives alone 11/09/19   Social Determinants of Health   Financial Resource Strain: Not on file  Food Insecurity: Not on file  Transportation Needs: Not on file  Physical Activity: Not on file  Stress: Not on file  Social Connections: Not on file  Intimate Partner Violence: Not on file     PHYSICAL EXAM: Vitals:   07/06/21 1515  BP: 127/87  Pulse: 98  SpO2: 98%   General: No acute distress, flat affect. Head:  Normocephalic/atraumatic Skin/Extremities: No rash, no edema, +scleral icterus Neurological Exam: alert and awake. No aphasia or dysarthria. Fund of knowledge is appropriate.  Attention and concentration are normal.   Cranial nerves: Pupils equal, round. Extraocular movements intact with no nystagmus. Visual fields full.  No facial asymmetry.  Motor: Bulk and tone normal, muscle strength 5/5 throughout with no pronator drift.   Finger to nose testing intact.  Gait narrow-based and steady, able to tandem walk adequately. No asterixis.   IMPRESSION: This is a 50 yo RH woman with a history of cirrhosis secondary to alcohol abuse and hepatitis C, schizophrenia, anxiety, and seizures since childhood with staring spells and GTCs suggestive of primary generalized epilepsy. EEG normal. There has been a reduction in seizures with increase in Levetiracetam to 750mg  BID, refills sent. She reports last alcohol intake was at the beginning of the month, we again discussed avoidance of seizure triggers, including missing medication, sleep deprivation, alcohol. She is also on gabapentin for neuropathy and clonazepam for anxiety. Continue follow-up with PCP, Psychiatry, GI. She is aware of [place] driving laws to stop driving after a seizure until 6 months seizure-free. Follow-up in 6 months, call for any  changes.    Thank you for allowing me to participate in her care.  Please do not hesitate to call for any questions or concerns.    Ellouise Newer, M.D.   CC: Kathe Becton, FNP

## 2021-07-09 ENCOUNTER — Encounter: Payer: Self-pay | Admitting: Oncology

## 2021-07-09 ENCOUNTER — Inpatient Hospital Stay: Payer: Medicaid Other

## 2021-07-09 DIAGNOSIS — D696 Thrombocytopenia, unspecified: Secondary | ICD-10-CM

## 2021-07-09 LAB — CBC AND DIFFERENTIAL
HCT: 40 (ref 36–46)
Hemoglobin: 13.3 (ref 12.0–16.0)
Neutrophils Absolute: 3.01
Platelets: 99 — AB (ref 150–399)
WBC: 4.3

## 2021-07-09 LAB — CBC: RBC: 4.39 (ref 3.87–5.11)

## 2021-07-10 ENCOUNTER — Ambulatory Visit: Payer: Self-pay | Admitting: Nurse Practitioner

## 2021-07-10 ENCOUNTER — Telehealth: Payer: Self-pay | Admitting: Oncology

## 2021-07-10 NOTE — Telephone Encounter (Signed)
Per Evelena Peat, patient has No Transportation for 11/2 Injection - Ok per Dr [name] to move out couple of days.  Reschedule 11/2 Injection to 11/4 at 3:30 pm

## 2021-07-11 ENCOUNTER — Ambulatory Visit: Payer: Medicaid Other

## 2021-07-11 ENCOUNTER — Inpatient Hospital Stay: Payer: Medicaid Other | Attending: Hematology & Oncology

## 2021-07-11 ENCOUNTER — Other Ambulatory Visit: Payer: Self-pay

## 2021-07-11 VITALS — BP 90/66 | HR 58 | Temp 97.9°F | Resp 18 | Ht 60.0 in | Wt 138.0 lb

## 2021-07-11 DIAGNOSIS — R001 Bradycardia, unspecified: Secondary | ICD-10-CM | POA: Insufficient documentation

## 2021-07-11 DIAGNOSIS — D696 Thrombocytopenia, unspecified: Secondary | ICD-10-CM

## 2021-07-11 DIAGNOSIS — D693 Immune thrombocytopenic purpura: Secondary | ICD-10-CM | POA: Insufficient documentation

## 2021-07-11 MED ORDER — ROMIPLOSTIM 250 MCG ~~LOC~~ SOLR
4.0000 ug/kg | Freq: Once | SUBCUTANEOUS | Status: AC
  Administered 2021-07-11: 250 ug via SUBCUTANEOUS
  Filled 2021-07-11: qty 0.5

## 2021-07-11 NOTE — Progress Notes (Signed)
Sent in request for DOS 11/03 to [employer].  Arranged transportation through [place] for DOS: 11/07, 11/09, 11/14, AND 11/16

## 2021-07-11 NOTE — Patient Instructions (Signed)
Romiplostim injection What is this medication? ROMIPLOSTIM (roe mi PLOE stim) helps your body make more platelets. This medicine is used to treat low platelets caused by chronic idiopathic thrombocytopenic purpura (ITP) or a bone marrow syndrome caused by radiation sickness. This medicine may be used for other purposes; ask your health care provider or pharmacist if you have questions. COMMON BRAND NAME(S): Nplate What should I tell my care team before I take this medication? They need to know if you have any of these conditions: blood clots myelodysplastic syndrome an unusual or allergic reaction to romiplostim, mannitol, other medicines, foods, dyes, or preservatives pregnant or trying to get pregnant breast-feeding How should I use this medication? This medicine is injected under the skin. It is given by a health care provider in a hospital or clinic setting. A special MedGuide will be given to you before each treatment. Be sure to read this information carefully each time. Talk to your health care provider about the use of this medicine in children. While it may be prescribed for children as young as newborns for selected conditions, precautions do apply. Overdosage: If you think you have taken too much of this medicine contact a poison control center or emergency room at once. NOTE: This medicine is only for you. Do not share this medicine with others. What if I miss a dose? Keep appointments for follow-up doses. It is important not to miss your dose. Call your health care provider if you are unable to keep an appointment. What may interact with this medication? Interactions are not expected. This list may not describe all possible interactions. Give your health care provider a list of all the medicines, herbs, non-prescription drugs, or dietary supplements you use. Also tell them if you smoke, drink alcohol, or use illegal drugs. Some items may interact with your medicine. What should I  watch for while using this medication? Visit your health care provider for regular checks on your progress. You may need blood work done while you are taking this medicine. Your condition will be monitored carefully while you are receiving this medicine. It is important not to miss any appointments. What side effects may I notice from receiving this medication? Side effects that you should report to your doctor or health care professional as soon as possible: allergic reactions (skin rash, itching or hives; swelling of the face, lips, or tongue) bleeding (bloody or black, tarry stools; red or dark brown urine; spitting up blood or brown material that looks like coffee grounds; red spots on the skin; unusual bruising or bleeding from the eyes, gums, or nose) blood clot (chest pain; shortness of breath; pain, swelling, or warmth in the leg) stroke (changes in vision; confusion; trouble speaking or understanding; severe headaches; sudden numbness or weakness of the face, arm or leg; trouble walking; dizziness; loss of balance or coordination) Side effects that usually do not require medical attention (report to your doctor or health care professional if they continue or are bothersome): diarrhea dizziness headache joint pain muscle pain stomach pain trouble sleeping This list may not describe all possible side effects. Call your doctor for medical advice about side effects. You may report side effects to FDA at 1-800-FDA-1088. Where should I keep my medication? This medicine is given in a hospital or clinic. It will not be stored at home. NOTE: This sheet is a summary. It may not cover all possible information. If you have questions about this medicine, talk to your doctor, pharmacist, or health care provider.    2022 Elsevier/Gold Standard (2019-10-11 10:28:13)  

## 2021-07-12 ENCOUNTER — Inpatient Hospital Stay: Payer: Medicaid Other

## 2021-07-13 ENCOUNTER — Ambulatory Visit: Payer: Self-pay | Admitting: Nurse Practitioner

## 2021-07-16 ENCOUNTER — Inpatient Hospital Stay: Payer: Medicaid Other

## 2021-07-16 ENCOUNTER — Ambulatory Visit: Payer: Self-pay | Admitting: Nurse Practitioner

## 2021-07-17 ENCOUNTER — Other Ambulatory Visit: Payer: Self-pay | Admitting: Pharmacist

## 2021-07-18 ENCOUNTER — Ambulatory Visit: Payer: Medicaid Other | Admitting: Plastic Surgery

## 2021-07-18 ENCOUNTER — Inpatient Hospital Stay: Payer: Medicaid Other

## 2021-07-19 ENCOUNTER — Other Ambulatory Visit: Payer: Self-pay

## 2021-07-19 ENCOUNTER — Encounter: Payer: Self-pay | Admitting: Nurse Practitioner

## 2021-07-19 ENCOUNTER — Encounter: Payer: Medicaid Other | Admitting: Nurse Practitioner

## 2021-07-19 ENCOUNTER — Emergency Department (HOSPITAL_COMMUNITY)
Admission: EM | Admit: 2021-07-19 | Discharge: 2021-07-19 | Discharge status: Against Medical Advice | Payer: Medicaid Other | Source: Home / Self Care

## 2021-07-23 ENCOUNTER — Other Ambulatory Visit: Payer: Self-pay | Admitting: Hematology and Oncology

## 2021-07-23 ENCOUNTER — Inpatient Hospital Stay (INDEPENDENT_AMBULATORY_CARE_PROVIDER_SITE_OTHER): Payer: Medicaid Other | Admitting: Hematology and Oncology

## 2021-07-23 ENCOUNTER — Encounter: Payer: Self-pay | Admitting: Hematology and Oncology

## 2021-07-23 ENCOUNTER — Inpatient Hospital Stay: Payer: Medicaid Other

## 2021-07-23 ENCOUNTER — Other Ambulatory Visit (HOSPITAL_COMMUNITY): Payer: Self-pay

## 2021-07-23 DIAGNOSIS — D696 Thrombocytopenia, unspecified: Secondary | ICD-10-CM

## 2021-07-23 DIAGNOSIS — D693 Immune thrombocytopenic purpura: Secondary | ICD-10-CM | POA: Diagnosis not present

## 2021-07-23 DIAGNOSIS — I361 Nonrheumatic tricuspid (valve) insufficiency: Secondary | ICD-10-CM | POA: Diagnosis not present

## 2021-07-23 DIAGNOSIS — R001 Bradycardia, unspecified: Secondary | ICD-10-CM | POA: Diagnosis not present

## 2021-07-23 LAB — CBC AND DIFFERENTIAL
HCT: 35 — AB (ref 36–46)
HCT: 36 (ref 36–46)
Hemoglobin: 11.8 — AB (ref 12.0–16.0)
Hemoglobin: 12 (ref 12.0–16.0)
Neutrophils Absolute: 1.98
Platelets: 68 — AB (ref 150–399)
Platelets: 71 — AB (ref 150–399)
WBC: 2.8
WBC: 3.1

## 2021-07-23 LAB — CORRECTED CALCIUM (CC13)
Absolute Lymphocytes: 0.43 — AB (ref 0.65–4.75)
MCV: 93 (ref 81–99)

## 2021-07-23 LAB — CBC
RBC: 3.81 — AB (ref 3.87–5.11)
RBC: 3.93 (ref 3.87–5.11)

## 2021-07-23 NOTE — Assessment & Plan Note (Signed)
Presents to clinic today for routine blood draw prior to Nplate injection for thrombocytopenia. She notes not feeling well. Vital signs reveal pulse in the 40s. She denies use of drugs or alcohol today. EKG in clinic reveals marked sinus bradycardia. Decision was made to transfer patient to ED for further evaluation. Report given to Dr. Sabra Heck and patient transferred to ED via wheelchair with Sammuel Cooper, LPN and Susy Frizzle, LPN in attendance.

## 2021-07-23 NOTE — Progress Notes (Signed)
Patient Care Team: Teena Dunk, NP as PCP - General (Nurse Practitioner) Cameron Sprang, MD as Consulting Physician (Neurology) Marice Potter, MD as Consulting Physician (Oncology)  Clinic Day:  07/23/2021  Referring physician: Bo Merino I, NP  ASSESSMENT & PLAN:   Assessment & Plan: 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[place]) Platelets today are 70, patient is due for Nplate on Thursday. She is tolerating treatments well.  Bradycardia Presents to clinic today for routine blood draw prior to Nplate injection for thrombocytopenia. She notes not feeling well. Vital signs reveal pulse in the 40s. She denies use of drugs or alcohol today. EKG in clinic reveals marked sinus bradycardia. Decision was made to transfer patient to ED for further evaluation. Report given to Dr. Sabra Heck and patient transferred to ED via wheelchair with Sammuel Cooper, LPN and Susy Frizzle, LPN in attendance.    The patient understands the plans discussed today and is in agreement with them.  She knows to contact our office if she develops concerns prior to her next appointment. Briana Ped, NP  [facility] AT [facility] [address] [place] [address] Dept: [phone] Dept Fax: [phone]   Orders Placed This Encounter  Procedures   CBC and differential    This external order was created through the Results Console.   CBC    This external order was created through the Results Console.      CHIEF COMPLAINT:  CC: A 50 year old female with history of thrombocytopenia here for symptom management  Current Treatment:  Nplate  INTERVAL HISTORY:  Briana Sweeney is here today for repeat clinical assessment. She denies fevers or chills. She denies pain. Her appetite is good. Her weight has been stable.  I have reviewed the past medical history, past surgical history, social history and family history with the patient and they  are unchanged from previous note.  ALLERGIES:  is allergic to other.  MEDICATIONS:     HISTORY OF PRESENT ILLNESS:   Oncology History  Thrombocytopenia (HCC)      REVIEW OF SYSTEMS:   Constitutional: Denies fevers, chills or abnormal weight loss Eyes: Denies blurriness of vision Ears, nose, mouth, throat, and face: Denies mucositis or sore throat Respiratory: Denies cough, dyspnea or wheezes Cardiovascular: Denies palpitation, chest discomfort or lower extremity swelling Gastrointestinal:  Denies nausea, heartburn or change in bowel habits Skin: Denies abnormal skin rashes Lymphatics: Denies new lymphadenopathy or easy bruising Neurological:Denies numbness, tingling or new weaknesses Behavioral/Psych: Mood is stable, no new changes  All other systems were reviewed with the patient and are negative.   VITALS:  There were no vitals taken for this visit.  Wt Readings from Last 3 Encounters:  07/19/21 141 lb 0.8 oz (64 kg)  07/11/21 138 lb (62.6 kg)  07/06/21 137 lb 9.6 oz (62.4 kg)    There is no height or weight on file to calculate BMI.  Performance status (ECOG): 1 - Symptomatic but completely ambulatory  PHYSICAL EXAM:   GENERAL:alert, no distress and comfortable SKIN: Jaundice EYES: Jaundice OROPHARYNX:no exudate, no erythema and lips, buccal mucosa, and tongue normal  NECK: supple, thyroid normal size, non-tender, without nodularity LYMPH:  no palpable lymphadenopathy in the cervical, axillary or inguinal LUNGS: clear to auscultation and percussion with normal breathing effort HEART: Bradycardia ABDOMEN:Distended Musculoskeletal:no cyanosis of digits and no clubbing  NEURO: alert & oriented x 3 with fluent speech, no focal motor/sensory deficits  LABORATORY DATA:  I have reviewed the data as listed    Component Value Date/Time   NA 133 (L) 06/29/2021 0236   NA 145 06/21/2021 0000   K 3.7 06/29/2021 0236   CL 94 (L) 06/29/2021 0236   CO2 31 06/29/2021  0236   GLUCOSE 111 (H) 06/29/2021 0236   BUN <5 (L) 06/29/2021 0236   BUN 4 06/21/2021 0000   CREATININE 0.79 06/29/2021 0236   CREATININE 0.64 11/15/2020 1034   CREATININE 0.54 05/01/2020 1043   CALCIUM 8.7 (L) 06/29/2021 0236   PROT 6.8 06/28/2021 0319   PROT 8.3 01/04/2020 1510   ALBUMIN 2.3 (L) 06/28/2021 0319   ALBUMIN 3.9 01/04/2020 1510   AST 163 (H) 06/28/2021 0319   AST 30 11/15/2020 1034   ALT 41 06/28/2021 0319   ALT 17 11/15/2020 1034   ALKPHOS 237 (H) 06/28/2021 0319   BILITOT 10.2 (H) 06/28/2021 0319   BILITOT 1.6 (H) 11/15/2020 1034   GFRNONAA >60 06/29/2021 0236   GFRNONAA >60 11/15/2020 1034   GFRNONAA 115 04/22/2019 1305   GFRAA >60 06/07/2020 0941   GFRAA 133 04/22/2019 1305    No results found for: SPEP, UPEP  Lab Results  Component Value Date   WBC 3.1 07/23/2021   WBC 2.8 07/23/2021   NEUTROABS 1.98 07/23/2021   HGB 12.0 07/23/2021   HGB 11.8 (A) 07/23/2021   HCT 36 07/23/2021   HCT 35 (A) 07/23/2021   MCV 93 07/23/2021   PLT 71 (A) 07/23/2021   PLT 68 (A) 07/23/2021      Chemistry      Component Value Date/Time   NA 133 (L) 06/29/2021 0236   NA 145 06/21/2021 0000   K 3.7 06/29/2021 0236   CL 94 (L) 06/29/2021 0236   CO2 31 06/29/2021 0236   BUN <5 (L) 06/29/2021 0236   BUN 4 06/21/2021 0000   CREATININE 0.79 06/29/2021 0236   CREATININE 0.64 11/15/2020 1034   CREATININE 0.54 05/01/2020 1043   GLU 73 06/21/2021 0000      Component Value Date/Time   CALCIUM 8.7 (L) 06/29/2021 0236   ALKPHOS 237 (H) 06/28/2021 0319   AST 163 (H) 06/28/2021 0319   AST 30 11/15/2020 1034   ALT 41 06/28/2021 0319   ALT 17 11/15/2020 1034   BILITOT 10.2 (H) 06/28/2021 0319   BILITOT 1.6 (H) 11/15/2020 1034       RADIOGRAPHIC STUDIES: I have personally reviewed the radiological images as listed and agreed with the findings in the report. DG Chest 2 View  Result Date: 06/25/2021 CLINICAL DATA:  Chest pain EXAM: CHEST - 2 VIEW COMPARISON:   Radiograph 06/22/2021 FINDINGS: Unchanged cardiomediastinal silhouette with calcified left hilar lymph nodes. There is no new focal airspace disease. There is no large pleural effusion or visible pneumothorax. Bilateral shoulder degenerative changes. IMPRESSION: No evidence of acute cardiopulmonary disease. Electronically Signed   By: Maurine Simmering M.D.   On: 06/25/2021 11:27   CT HEAD WO CONTRAST (5MM)  Result Date: 06/28/2021 CLINICAL DATA:  Headache, intracranial hemorrhage suspected EXAM: CT HEAD WITHOUT CONTRAST TECHNIQUE: Contiguous axial images were obtained from the base of the skull through the vertex without intravenous contrast. COMPARISON:  06/12/2021 FINDINGS: Brain: No evidence of acute infarction, hemorrhage, cerebral edema, mass, mass effect, or midline shift. Ventricles and sulci are within normal limits for age. No extra-axial fluid collection. Left basal ganglia lacunar infarct. Vascular: No hyperdense vessel or unexpected calcification. Skull: Normal. Negative for fracture or focal lesion. Sinuses/Orbits: No  acute finding. Other: The mastoid air cells are well aerated. Decreased right parietal scalp hematoma. IMPRESSION: No acute intracranial process.  No acute intracranial hemorrhage. Electronically Signed   By: Merilyn Baba M.D.   On: 06/28/2021 20:45   IR Paracentesis  Result Date: 06/26/2021 INDICATION: Patient with history of liver cirrhosis, portal hypertension, esophageal varices with ascites. Request for therapeutic and diagnostic paracentesis EXAM: ULTRASOUND GUIDED THERAPEUTIC AND DIAGNOSTIC PARACENTESIS MEDICATIONS: Lidocaine 1% 10 mL COMPLICATIONS: None immediate. PROCEDURE: Informed written consent was obtained from the patient after a discussion of the risks, benefits and alternatives to treatment. A timeout was performed prior to the initiation of the procedure. Initial ultrasound scanning demonstrates a small amount of ascites within the left lower abdominal quadrant. The  left lower abdomen was prepped and draped in the usual sterile fashion. 1% lidocaine was used for local anesthesia. Following this, a 19 gauge, 10-cm, Yueh catheter was introduced. An ultrasound image was saved for documentation purposes. The paracentesis was performed. The catheter was removed and a dressing was applied. The patient tolerated the procedure well without immediate post procedural complication. FINDINGS: A total of approximately 1.1 L of straw-colored fluid was removed. Samples were sent to the laboratory as requested by the clinical team. IMPRESSION: Successful ultrasound-guided therapeutic and diagnostic paracentesis yielding 1.1 liters of peritoneal fluid. Read by: Rushie Nyhan, NP Electronically Signed   By: Markus Daft M.D.   On: 06/26/2021 17:33

## 2021-07-23 NOTE — Progress Notes (Signed)
Spoke with [facility] and set up transportation for DOS 11/21, 11/23, 11/28, and 11/30.

## 2021-07-23 NOTE — Assessment & Plan Note (Signed)
Platelets today are 29, patient is due for Nplate on Thursday. She is tolerating treatments well.

## 2021-07-23 NOTE — Progress Notes (Signed)
Patient in for lab appointment wanted vital signs taken heart rate 38-40. Highest was 46 but would keep dropping. Melissa aware she came and evaluated patient EKG done and patient transferred to the Emergency room Melissa called report prior to transfer.

## 2021-07-24 DIAGNOSIS — R001 Bradycardia, unspecified: Secondary | ICD-10-CM | POA: Diagnosis not present

## 2021-07-24 DIAGNOSIS — F1010 Alcohol abuse, uncomplicated: Secondary | ICD-10-CM | POA: Diagnosis not present

## 2021-07-24 DIAGNOSIS — K7460 Unspecified cirrhosis of liver: Secondary | ICD-10-CM | POA: Diagnosis not present

## 2021-07-25 ENCOUNTER — Encounter: Payer: Self-pay | Admitting: Oncology

## 2021-07-25 ENCOUNTER — Inpatient Hospital Stay: Payer: Medicaid Other

## 2021-07-25 ENCOUNTER — Other Ambulatory Visit (HOSPITAL_COMMUNITY): Payer: Self-pay

## 2021-07-25 ENCOUNTER — Ambulatory Visit: Payer: Self-pay | Admitting: Nurse Practitioner

## 2021-07-26 ENCOUNTER — Inpatient Hospital Stay: Payer: Medicaid Other

## 2021-07-30 ENCOUNTER — Other Ambulatory Visit: Payer: Self-pay

## 2021-07-30 ENCOUNTER — Inpatient Hospital Stay: Payer: Medicaid Other

## 2021-07-30 ENCOUNTER — Other Ambulatory Visit: Payer: Self-pay | Admitting: Hematology and Oncology

## 2021-07-30 DIAGNOSIS — D696 Thrombocytopenia, unspecified: Secondary | ICD-10-CM

## 2021-07-30 LAB — CBC AND DIFFERENTIAL
HCT: 38 (ref 36–46)
Hemoglobin: 12.4 (ref 12.0–16.0)
Neutrophils Absolute: 1.54
Platelets: 40 — AB (ref 150–399)
WBC: 2.8

## 2021-07-30 LAB — CBC: RBC: 4.12 (ref 3.87–5.11)

## 2021-07-31 ENCOUNTER — Telehealth: Payer: Self-pay | Admitting: Cardiology

## 2021-07-31 ENCOUNTER — Encounter: Payer: Self-pay | Admitting: Oncology

## 2021-07-31 DIAGNOSIS — R001 Bradycardia, unspecified: Secondary | ICD-10-CM

## 2021-07-31 NOTE — Telephone Encounter (Signed)
Spoke to patient she reports Dr. Agustin Cree saw her a couple weeks ago at [facility] and she was supposed to come get a monitor but she wasn't able to fund our office. Will check with him about what monitor, how long, and the diagnosis and call her back tomorrow. She understands. No further questions.

## 2021-07-31 NOTE — Addendum Note (Signed)
Addended by: Juanetta Beets on: 07/31/2021 11:45 AM   Modules accepted: Orders

## 2021-07-31 NOTE — Telephone Encounter (Signed)
Pt is stating that she has a prescription for a zio monitor and is needing to schedule an appt to have it placed... no request on file... pt saw Dr. Raliegh Ip in the ICU please advise

## 2021-08-01 ENCOUNTER — Ambulatory Visit: Payer: Medicaid Other

## 2021-08-01 ENCOUNTER — Inpatient Hospital Stay: Payer: Medicaid Other

## 2021-08-01 ENCOUNTER — Other Ambulatory Visit: Payer: Self-pay

## 2021-08-01 VITALS — BP 112/74 | HR 46 | Temp 98.1°F | Resp 18

## 2021-08-01 DIAGNOSIS — R001 Bradycardia, unspecified: Secondary | ICD-10-CM

## 2021-08-01 DIAGNOSIS — D696 Thrombocytopenia, unspecified: Secondary | ICD-10-CM

## 2021-08-01 DIAGNOSIS — D693 Immune thrombocytopenic purpura: Secondary | ICD-10-CM | POA: Diagnosis not present

## 2021-08-01 MED ORDER — ROMIPLOSTIM 250 MCG ~~LOC~~ SOLR
3.9400 ug/kg | Freq: Once | SUBCUTANEOUS | Status: AC
  Administered 2021-08-01: 250 ug via SUBCUTANEOUS
  Filled 2021-08-01: qty 0.5

## 2021-08-01 NOTE — Patient Instructions (Signed)
Romiplostim injection ?What is this medication? ?ROMIPLOSTIM (roe mi PLOE stim) helps your body make more platelets. This medicine is used to treat low platelets caused by chronic idiopathic thrombocytopenic purpura (ITP) or a bone marrow syndrome caused by radiation sickness. ?This medicine may be used for other purposes; ask your health care provider or pharmacist if you have questions. ?COMMON BRAND NAME(S): Nplate ?What should I tell my care team before I take this medication? ?They need to know if you have any of these conditions: ?blood clots ?myelodysplastic syndrome ?an unusual or allergic reaction to romiplostim, mannitol, other medicines, foods, dyes, or preservatives ?pregnant or trying to get pregnant ?breast-feeding ?How should I use this medication? ?This medicine is injected under the skin. It is given by a health care provider in a hospital or clinic setting. ?A special MedGuide will be given to you before each treatment. Be sure to read this information carefully each time. ?Talk to your health care provider about the use of this medicine in children. While it may be prescribed for children as young as newborns for selected conditions, precautions do apply. ?Overdosage: If you think you have taken too much of this medicine contact a poison control center or emergency room at once. ?NOTE: This medicine is only for you. Do not share this medicine with others. ?What if I miss a dose? ?Keep appointments for follow-up doses. It is important not to miss your dose. Call your health care provider if you are unable to keep an appointment. ?What may interact with this medication? ?Interactions are not expected. ?This list may not describe all possible interactions. Give your health care provider a list of all the medicines, herbs, non-prescription drugs, or dietary supplements you use. Also tell them if you smoke, drink alcohol, or use illegal drugs. Some items may interact with your medicine. ?What should I  watch for while using this medication? ?Visit your health care provider for regular checks on your progress. You may need blood work done while you are taking this medicine. Your condition will be monitored carefully while you are receiving this medicine. It is important not to miss any appointments. ?What side effects may I notice from receiving this medication? ?Side effects that you should report to your doctor or health care professional as soon as possible: ?allergic reactions (skin rash, itching or hives; swelling of the face, lips, or tongue) ?bleeding (bloody or black, tarry stools; red or dark brown urine; spitting up blood or brown material that looks like coffee grounds; red spots on the skin; unusual bruising or bleeding from the eyes, gums, or nose) ?blood clot (chest pain; shortness of breath; pain, swelling, or warmth in the leg) ?stroke (changes in vision; confusion; trouble speaking or understanding; severe headaches; sudden numbness or weakness of the face, arm or leg; trouble walking; dizziness; loss of balance or coordination) ?Side effects that usually do not require medical attention (report to your doctor or health care professional if they continue or are bothersome): ?diarrhea ?dizziness ?headache ?joint pain ?muscle pain ?stomach pain ?trouble sleeping ?This list may not describe all possible side effects. Call your doctor for medical advice about side effects. You may report side effects to FDA at 1-800-FDA-1088. ?Where should I keep my medication? ?This medicine is given in a hospital or clinic. It will not be stored at home. ?NOTE: This sheet is a summary. It may not cover all possible information. If you have questions about this medicine, talk to your doctor, pharmacist, or health care provider. ??   2022 Elsevier/Gold Standard (2021-05-15 00:00:00) ? ?

## 2021-08-01 NOTE — Telephone Encounter (Signed)
Spoke to patient she will comet today for monitor.

## 2021-08-06 ENCOUNTER — Other Ambulatory Visit: Payer: Self-pay

## 2021-08-06 ENCOUNTER — Other Ambulatory Visit: Payer: Self-pay | Admitting: Hematology and Oncology

## 2021-08-06 ENCOUNTER — Inpatient Hospital Stay: Payer: Medicaid Other

## 2021-08-06 ENCOUNTER — Encounter: Payer: Self-pay | Admitting: Oncology

## 2021-08-06 DIAGNOSIS — D696 Thrombocytopenia, unspecified: Secondary | ICD-10-CM

## 2021-08-06 LAB — CBC AND DIFFERENTIAL
HCT: 35 — AB (ref 36–46)
Hemoglobin: 11.8 — AB (ref 12.0–16.0)
Neutrophils Absolute: 2.28
Platelets: 18 — AB (ref 150–399)
WBC: 3.5

## 2021-08-06 LAB — CBC: RBC: 3.92 (ref 3.87–5.11)

## 2021-08-06 NOTE — Progress Notes (Signed)
Patient presents to clinic for lab draw. She reports feeling weak. She denies any drug or alcohol use today, although she is slurring her words today and I do note an odor of alcohol. She has seen a cardiologist and is currently wearing a heart monitor for her bradycardia. Heart rate today is 50 with BP 112/68. She denies fever, chills, nausea or vomiting and refuses evaluation in the ED. I had her press her button on her monitor to indicate symptoms and advised that she be evaluated in the ED if symptoms worsen or she develops new symptoms.

## 2021-08-06 NOTE — Progress Notes (Signed)
Spoke with [facility] and scheduled transportation for DOS 12/05, 12/07, 12/12, and 12/14.

## 2021-08-08 ENCOUNTER — Other Ambulatory Visit: Payer: Self-pay

## 2021-08-08 ENCOUNTER — Inpatient Hospital Stay: Payer: Medicaid Other

## 2021-08-08 VITALS — BP 103/67 | HR 47 | Temp 97.6°F | Resp 18 | Ht 59.0 in | Wt 130.0 lb

## 2021-08-08 DIAGNOSIS — D693 Immune thrombocytopenic purpura: Secondary | ICD-10-CM | POA: Diagnosis not present

## 2021-08-08 DIAGNOSIS — D696 Thrombocytopenia, unspecified: Secondary | ICD-10-CM

## 2021-08-08 MED ORDER — ROMIPLOSTIM 250 MCG ~~LOC~~ SOLR
4.2000 ug/kg | Freq: Once | SUBCUTANEOUS | Status: AC
  Administered 2021-08-08: 250 ug via SUBCUTANEOUS
  Filled 2021-08-08: qty 0.5

## 2021-08-08 NOTE — Patient Instructions (Signed)
Romiplostim injection ?What is this medication? ?ROMIPLOSTIM (roe mi PLOE stim) helps your body make more platelets. This medicine is used to treat low platelets caused by chronic idiopathic thrombocytopenic purpura (ITP) or a bone marrow syndrome caused by radiation sickness. ?This medicine may be used for other purposes; ask your health care provider or pharmacist if you have questions. ?COMMON BRAND NAME(S): Nplate ?What should I tell my care team before I take this medication? ?They need to know if you have any of these conditions: ?blood clots ?myelodysplastic syndrome ?an unusual or allergic reaction to romiplostim, mannitol, other medicines, foods, dyes, or preservatives ?pregnant or trying to get pregnant ?breast-feeding ?How should I use this medication? ?This medicine is injected under the skin. It is given by a health care provider in a hospital or clinic setting. ?A special MedGuide will be given to you before each treatment. Be sure to read this information carefully each time. ?Talk to your health care provider about the use of this medicine in children. While it may be prescribed for children as young as newborns for selected conditions, precautions do apply. ?Overdosage: If you think you have taken too much of this medicine contact a poison control center or emergency room at once. ?NOTE: This medicine is only for you. Do not share this medicine with others. ?What if I miss a dose? ?Keep appointments for follow-up doses. It is important not to miss your dose. Call your health care provider if you are unable to keep an appointment. ?What may interact with this medication? ?Interactions are not expected. ?This list may not describe all possible interactions. Give your health care provider a list of all the medicines, herbs, non-prescription drugs, or dietary supplements you use. Also tell them if you smoke, drink alcohol, or use illegal drugs. Some items may interact with your medicine. ?What should I  watch for while using this medication? ?Visit your health care provider for regular checks on your progress. You may need blood work done while you are taking this medicine. Your condition will be monitored carefully while you are receiving this medicine. It is important not to miss any appointments. ?What side effects may I notice from receiving this medication? ?Side effects that you should report to your doctor or health care professional as soon as possible: ?allergic reactions (skin rash, itching or hives; swelling of the face, lips, or tongue) ?bleeding (bloody or black, tarry stools; red or dark brown urine; spitting up blood or brown material that looks like coffee grounds; red spots on the skin; unusual bruising or bleeding from the eyes, gums, or nose) ?blood clot (chest pain; shortness of breath; pain, swelling, or warmth in the leg) ?stroke (changes in vision; confusion; trouble speaking or understanding; severe headaches; sudden numbness or weakness of the face, arm or leg; trouble walking; dizziness; loss of balance or coordination) ?Side effects that usually do not require medical attention (report to your doctor or health care professional if they continue or are bothersome): ?diarrhea ?dizziness ?headache ?joint pain ?muscle pain ?stomach pain ?trouble sleeping ?This list may not describe all possible side effects. Call your doctor for medical advice about side effects. You may report side effects to FDA at 1-800-FDA-1088. ?Where should I keep my medication? ?This medicine is given in a hospital or clinic. It will not be stored at home. ?NOTE: This sheet is a summary. It may not cover all possible information. If you have questions about this medicine, talk to your doctor, pharmacist, or health care provider. ??   2022 Elsevier/Gold Standard (2021-05-15 00:00:00) ? ?

## 2021-08-11 ENCOUNTER — Other Ambulatory Visit (HOSPITAL_COMMUNITY): Payer: Self-pay

## 2021-08-13 ENCOUNTER — Inpatient Hospital Stay: Payer: Medicaid Other

## 2021-08-13 DIAGNOSIS — R001 Bradycardia, unspecified: Secondary | ICD-10-CM | POA: Diagnosis not present

## 2021-08-13 NOTE — Progress Notes (Signed)
Spoke with [facility] and scheduled her for DOS 12/19 and 12/21.

## 2021-08-14 ENCOUNTER — Telehealth: Payer: Self-pay | Admitting: Cardiology

## 2021-08-14 NOTE — Telephone Encounter (Signed)
Patient states she needs an appt to have a zio patch removed.

## 2021-08-14 NOTE — Telephone Encounter (Signed)
Left VM to call back 

## 2021-08-14 NOTE — Telephone Encounter (Signed)
Spoke to patient she will mail monitor in the box that she has for it. She wants to know if she needs to make a appointment with Dr. Agustin Cree. Will check with him.

## 2021-08-15 ENCOUNTER — Inpatient Hospital Stay: Payer: Medicaid Other

## 2021-08-15 ENCOUNTER — Ambulatory Visit: Payer: Medicaid Other | Admitting: Plastic Surgery

## 2021-08-17 NOTE — Telephone Encounter (Signed)
Patient aware we will determine if she needs an appointment once we get he monitor results. She understood no further questions.

## 2021-08-20 ENCOUNTER — Other Ambulatory Visit (HOSPITAL_COMMUNITY): Payer: Self-pay

## 2021-08-20 ENCOUNTER — Inpatient Hospital Stay: Payer: Medicaid Other | Attending: Hematology & Oncology

## 2021-08-20 ENCOUNTER — Encounter: Payer: Self-pay | Admitting: Oncology

## 2021-08-20 ENCOUNTER — Encounter: Payer: Self-pay | Admitting: Hematology and Oncology

## 2021-08-20 DIAGNOSIS — D696 Thrombocytopenia, unspecified: Secondary | ICD-10-CM

## 2021-08-20 DIAGNOSIS — Z79899 Other long term (current) drug therapy: Secondary | ICD-10-CM | POA: Insufficient documentation

## 2021-08-20 DIAGNOSIS — D693 Immune thrombocytopenic purpura: Secondary | ICD-10-CM | POA: Insufficient documentation

## 2021-08-20 LAB — CBC: RBC: 4.23 (ref 3.87–5.11)

## 2021-08-20 LAB — CBC AND DIFFERENTIAL
HCT: 38 (ref 36–46)
Hemoglobin: 12.5 (ref 12.0–16.0)
Neutrophils Absolute: 1.42
Platelets: 101 — AB (ref 150–399)
WBC: 2.4

## 2021-08-21 ENCOUNTER — Other Ambulatory Visit: Payer: Self-pay | Admitting: Pharmacist

## 2021-08-21 ENCOUNTER — Other Ambulatory Visit (HOSPITAL_COMMUNITY): Payer: Self-pay

## 2021-08-21 ENCOUNTER — Encounter: Payer: Self-pay | Admitting: Oncology

## 2021-08-21 MED ORDER — CHLORDIAZEPOXIDE HCL 25 MG PO CAPS
ORAL_CAPSULE | ORAL | 0 refills | Status: DC
  Filled 2021-08-21: qty 20, 5d supply, fill #0

## 2021-08-21 MED ORDER — SPIRONOLACTONE 25 MG PO TABS
ORAL_TABLET | ORAL | 0 refills | Status: DC
  Filled 2021-08-21: qty 30, 30d supply, fill #0

## 2021-08-22 ENCOUNTER — Other Ambulatory Visit: Payer: Self-pay

## 2021-08-22 ENCOUNTER — Inpatient Hospital Stay: Payer: Medicaid Other

## 2021-08-22 VITALS — BP 90/65 | HR 86 | Temp 97.8°F | Resp 18 | Ht 59.0 in | Wt 130.0 lb

## 2021-08-22 DIAGNOSIS — Z79899 Other long term (current) drug therapy: Secondary | ICD-10-CM | POA: Diagnosis not present

## 2021-08-22 DIAGNOSIS — D696 Thrombocytopenia, unspecified: Secondary | ICD-10-CM

## 2021-08-22 DIAGNOSIS — D693 Immune thrombocytopenic purpura: Secondary | ICD-10-CM | POA: Diagnosis not present

## 2021-08-22 MED ORDER — ROMIPLOSTIM 250 MCG ~~LOC~~ SOLR
4.2500 ug/kg | Freq: Once | SUBCUTANEOUS | Status: AC
  Administered 2021-08-22: 250 ug via SUBCUTANEOUS
  Filled 2021-08-22: qty 0.5

## 2021-08-22 NOTE — Patient Instructions (Signed)
Romiplostim injection ?What is this medication? ?ROMIPLOSTIM (roe mi PLOE stim) helps your body make more platelets. This medicine is used to treat low platelets caused by chronic idiopathic thrombocytopenic purpura (ITP) or a bone marrow syndrome caused by radiation sickness. ?This medicine may be used for other purposes; ask your health care provider or pharmacist if you have questions. ?COMMON BRAND NAME(S): Nplate ?What should I tell my care team before I take this medication? ?They need to know if you have any of these conditions: ?blood clots ?myelodysplastic syndrome ?an unusual or allergic reaction to romiplostim, mannitol, other medicines, foods, dyes, or preservatives ?pregnant or trying to get pregnant ?breast-feeding ?How should I use this medication? ?This medicine is injected under the skin. It is given by a health care provider in a hospital or clinic setting. ?A special MedGuide will be given to you before each treatment. Be sure to read this information carefully each time. ?Talk to your health care provider about the use of this medicine in children. While it may be prescribed for children as young as newborns for selected conditions, precautions do apply. ?Overdosage: If you think you have taken too much of this medicine contact a poison control center or emergency room at once. ?NOTE: This medicine is only for you. Do not share this medicine with others. ?What if I miss a dose? ?Keep appointments for follow-up doses. It is important not to miss your dose. Call your health care provider if you are unable to keep an appointment. ?What may interact with this medication? ?Interactions are not expected. ?This list may not describe all possible interactions. Give your health care provider a list of all the medicines, herbs, non-prescription drugs, or dietary supplements you use. Also tell them if you smoke, drink alcohol, or use illegal drugs. Some items may interact with your medicine. ?What should I  watch for while using this medication? ?Visit your health care provider for regular checks on your progress. You may need blood work done while you are taking this medicine. Your condition will be monitored carefully while you are receiving this medicine. It is important not to miss any appointments. ?What side effects may I notice from receiving this medication? ?Side effects that you should report to your doctor or health care professional as soon as possible: ?allergic reactions (skin rash, itching or hives; swelling of the face, lips, or tongue) ?bleeding (bloody or black, tarry stools; red or dark brown urine; spitting up blood or brown material that looks like coffee grounds; red spots on the skin; unusual bruising or bleeding from the eyes, gums, or nose) ?blood clot (chest pain; shortness of breath; pain, swelling, or warmth in the leg) ?stroke (changes in vision; confusion; trouble speaking or understanding; severe headaches; sudden numbness or weakness of the face, arm or leg; trouble walking; dizziness; loss of balance or coordination) ?Side effects that usually do not require medical attention (report to your doctor or health care professional if they continue or are bothersome): ?diarrhea ?dizziness ?headache ?joint pain ?muscle pain ?stomach pain ?trouble sleeping ?This list may not describe all possible side effects. Call your doctor for medical advice about side effects. You may report side effects to FDA at 1-800-FDA-1088. ?Where should I keep my medication? ?This medicine is given in a hospital or clinic. It will not be stored at home. ?NOTE: This sheet is a summary. It may not cover all possible information. If you have questions about this medicine, talk to your doctor, pharmacist, or health care provider. ??   2022 Elsevier/Gold Standard (2021-05-15 00:00:00) ? ?

## 2021-08-24 ENCOUNTER — Telehealth: Payer: Self-pay | Admitting: *Deleted

## 2021-08-24 NOTE — Telephone Encounter (Signed)
Spoke with pt to confirm that she had mailed her monitor back. Received email from [employer] that the box she sent was empty. Pt is sure she put it in box and sealed it. Please advise

## 2021-08-27 ENCOUNTER — Other Ambulatory Visit: Payer: Self-pay

## 2021-08-27 ENCOUNTER — Other Ambulatory Visit: Payer: Self-pay | Admitting: Hematology and Oncology

## 2021-08-27 ENCOUNTER — Inpatient Hospital Stay: Payer: Medicaid Other

## 2021-08-27 DIAGNOSIS — D696 Thrombocytopenia, unspecified: Secondary | ICD-10-CM

## 2021-08-27 DIAGNOSIS — R001 Bradycardia, unspecified: Secondary | ICD-10-CM

## 2021-08-27 LAB — CBC AND DIFFERENTIAL
HCT: 43 (ref 36–46)
Hemoglobin: 13.8 (ref 12.0–16.0)
Neutrophils Absolute: 2.24
Platelets: 83 — AB (ref 150–399)
WBC: 3.5

## 2021-08-27 LAB — CBC: RBC: 4.88 (ref 3.87–5.11)

## 2021-08-27 NOTE — Progress Notes (Signed)
Spoke with [facility] and scheduled transportation for 12/28 and 12/29.

## 2021-08-27 NOTE — Progress Notes (Signed)
This encounter was created in error - please disregard.

## 2021-08-27 NOTE — Telephone Encounter (Signed)
Spoke to the patient just now and got her scheduled to come in on Friday to have this monitor put on. She verbalizes understanding   Encouraged patient to call back with any questions or concerns.

## 2021-08-29 ENCOUNTER — Inpatient Hospital Stay: Payer: Medicaid Other

## 2021-08-29 ENCOUNTER — Other Ambulatory Visit: Payer: Self-pay

## 2021-08-29 VITALS — BP 140/96 | HR 133 | Temp 98.7°F | Resp 18 | Ht 59.0 in | Wt 127.5 lb

## 2021-08-29 DIAGNOSIS — D696 Thrombocytopenia, unspecified: Secondary | ICD-10-CM

## 2021-08-29 DIAGNOSIS — D693 Immune thrombocytopenic purpura: Secondary | ICD-10-CM | POA: Diagnosis not present

## 2021-08-29 MED ORDER — ROMIPLOSTIM 250 MCG ~~LOC~~ SOLR
4.2500 ug/kg | Freq: Once | SUBCUTANEOUS | Status: AC
  Administered 2021-08-29: 250 ug via SUBCUTANEOUS
  Filled 2021-08-29: qty 0.5

## 2021-08-29 NOTE — Progress Notes (Signed)
1240 Patient came today for her Nplate shot. When taking her vital signs noted a very rapid pulse at 130 - 134. The patient has been monitored before via cardiologist for this. She is due to go to the cardiologist on 08/31/2021 for a check up. She verbalized to the nurse that she would call him if the tachycardia continues. The patient had no other side effects that she voiced. Melissa Parsons,NP is aware of the above and agrees.

## 2021-08-29 NOTE — Patient Instructions (Signed)
Sinus Tachycardia Sinus tachycardia is a kind of fast heartbeat. In sinus tachycardia, the heart beats more than 100 times a minute. Sinus tachycardia starts in a part of the heart called the sinus node. Sinus tachycardia may be harmless, or it may be a sign of a serious condition. What are the causes? This condition may be caused by: Exercise or exertion. A fever. Pain. Loss of body fluids (dehydration). Severe bleeding (hemorrhage). Anxiety and stress. Certain substances, including: Alcohol. Caffeine. Tobacco and nicotine products. Cold medicines. Illegal drugs. Medical conditions including: Heart disease. An infection. An overactive thyroid (hyperthyroidism). A lack of red blood cells (anemia). What are the signs or symptoms? Symptoms of this condition include: A feeling that the heart is beating quickly (palpitations). Suddenly noticing your heartbeat (cardiac awareness). Dizziness. Tiredness (fatigue). Shortness of breath. Chest pain. Nausea. Fainting. How is this diagnosed? This condition is diagnosed with: A physical exam. Other tests, such as: Blood tests. An electrocardiogram (ECG). This test measures the electrical activity of the heart. Ambulatory cardiac monitor. This records your heartbeats for 24 hours or more. You may be referred to a heart specialist (cardiologist). How is this treated? Treatment for this condition depends on the cause or the underlying condition. Treatment may involve: Treating the underlying condition. Taking new medicines or changing your current medicines as told by your health care provider. Making changes to your diet or lifestyle. Follow these instructions at home: Lifestyle  Do not use any products that contain nicotine or tobacco, such as cigarettes and e-cigarettes. If you need help quitting, ask your health care provider. Do not use illegal drugs, such as cocaine. Learn relaxation methods to help you when you get stressed or  anxious. These include deep breathing. Avoid caffeine or other stimulants. Alcohol use  Do not drink alcohol if: Your health care provider tells you not to drink. You are pregnant, may be pregnant, or are planning to become pregnant. If you drink alcohol, limit how much you have: 0-1 drink a day for women. 0-2 drinks a day for men. Be aware of how much alcohol is in your drink. In the [place] one drink equals one typical bottle of beer (12 oz), one-half glass of wine (5 oz), or one shot of hard liquor (1 oz). General instructions Drink enough fluids to keep your urine pale yellow. Take over-the-counter and prescription medicines only as told by your health care provider. Keep all follow-up visits as told by your health care provider. This is important. Contact a health care provider if you have: A fever. Vomiting or diarrhea that does not go away. Get help right away if you: Have pain in your chest, upper arms, jaw, or neck. Become weak or dizzy. Feel faint. Have palpitations that do not go away. Summary In sinus tachycardia, the heart beats more than 100 times a minute. Sinus tachycardia may be harmless, or it may be a sign of a serious condition. Treatment for this condition depends on the cause or the underlying condition. Get help right away if you have pain in your chest, upper arms, jaw, or neck. This information is not intended to replace advice given to you by your health care provider. Make sure you discuss any questions you have with your health care provider. Document Revised: 01/04/2021 Document Reviewed: 01/04/2021 Elsevier Patient Education  [place]. Romiplostim injection What is this medication? ROMIPLOSTIM (roe mi PLOE stim) helps your body make more platelets. This medicine is used to treat low platelets caused by chronic  idiopathic thrombocytopenic purpura (ITP) or a bone marrow syndrome caused by radiation sickness. This medicine may be used for other  purposes; ask your health care provider or pharmacist if you have questions. COMMON BRAND NAME(S): Nplate What should I tell my care team before I take this medication? They need to know if you have any of these conditions: blood clots myelodysplastic syndrome an unusual or allergic reaction to romiplostim, mannitol, other medicines, foods, dyes, or preservatives pregnant or trying to get pregnant breast-feeding How should I use this medication? This medicine is injected under the skin. It is given by a health care provider in a hospital or clinic setting. A special MedGuide will be given to you before each treatment. Be sure to read this information carefully each time. Talk to your health care provider about the use of this medicine in children. While it may be prescribed for children as young as newborns for selected conditions, precautions do apply. Overdosage: If you think you have taken too much of this medicine contact a poison control center or emergency room at once. NOTE: This medicine is only for you. Do not share this medicine with others. What if I miss a dose? Keep appointments for follow-up doses. It is important not to miss your dose. Call your health care provider if you are unable to keep an appointment. What may interact with this medication? Interactions are not expected. This list may not describe all possible interactions. Give your health care provider a list of all the medicines, herbs, non-prescription drugs, or dietary supplements you use. Also tell them if you smoke, drink alcohol, or use illegal drugs. Some items may interact with your medicine. What should I watch for while using this medication? Visit your health care provider for regular checks on your progress. You may need blood work done while you are taking this medicine. Your condition will be monitored carefully while you are receiving this medicine. It is important not to miss any appointments. What side  effects may I notice from receiving this medication? Side effects that you should report to your doctor or health care professional as soon as possible: allergic reactions (skin rash, itching or hives; swelling of the face, lips, or tongue) bleeding (bloody or black, tarry stools; red or dark brown urine; spitting up blood or brown material that looks like coffee grounds; red spots on the skin; unusual bruising or bleeding from the eyes, gums, or nose) blood clot (chest pain; shortness of breath; pain, swelling, or warmth in the leg) stroke (changes in vision; confusion; trouble speaking or understanding; severe headaches; sudden numbness or weakness of the face, arm or leg; trouble walking; dizziness; loss of balance or coordination) Side effects that usually do not require medical attention (report to your doctor or health care professional if they continue or are bothersome): diarrhea dizziness headache joint pain muscle pain stomach pain trouble sleeping This list may not describe all possible side effects. Call your doctor for medical advice about side effects. You may report side effects to FDA at 1-800-FDA-1088. Where should I keep my medication? This medicine is given in a hospital or clinic. It will not be stored at home. NOTE: This sheet is a summary. It may not cover all possible information. If you have questions about this medicine, talk to your doctor, pharmacist, or health care provider.  2022 Elsevier/Gold Standard (2021-05-15 00:00:00)

## 2021-08-30 ENCOUNTER — Encounter: Payer: Self-pay | Admitting: Oncology

## 2021-08-30 ENCOUNTER — Other Ambulatory Visit (HOSPITAL_COMMUNITY): Payer: Self-pay

## 2021-08-30 MED ORDER — FLUOXETINE HCL 10 MG PO CAPS
10.0000 mg | ORAL_CAPSULE | Freq: Every day | ORAL | 2 refills | Status: DC
  Filled 2021-08-30: qty 30, 30d supply, fill #0

## 2021-08-30 MED ORDER — ARIPIPRAZOLE 10 MG PO TABS
10.0000 mg | ORAL_TABLET | Freq: Every day | ORAL | 2 refills | Status: DC
  Filled 2021-08-30 – 2021-09-10 (×3): qty 30, 30d supply, fill #0
  Filled 2021-09-27: qty 30, 30d supply, fill #1
  Filled 2021-11-11: qty 30, 30d supply, fill #2

## 2021-08-30 MED ORDER — CLONAZEPAM 0.5 MG PO TABS
0.5000 mg | ORAL_TABLET | Freq: Two times a day (BID) | ORAL | 2 refills | Status: DC
  Filled 2021-09-04 – 2021-09-17 (×3): qty 60, 30d supply, fill #0
  Filled 2021-10-15: qty 60, 30d supply, fill #1
  Filled 2021-12-21: qty 60, 30d supply, fill #2

## 2021-08-31 ENCOUNTER — Other Ambulatory Visit: Payer: Self-pay

## 2021-08-31 ENCOUNTER — Ambulatory Visit (INDEPENDENT_AMBULATORY_CARE_PROVIDER_SITE_OTHER): Payer: Medicaid Other

## 2021-08-31 DIAGNOSIS — R001 Bradycardia, unspecified: Secondary | ICD-10-CM | POA: Diagnosis not present

## 2021-09-04 ENCOUNTER — Other Ambulatory Visit (HOSPITAL_COMMUNITY): Payer: Self-pay

## 2021-09-04 ENCOUNTER — Inpatient Hospital Stay: Payer: Medicaid Other

## 2021-09-05 ENCOUNTER — Other Ambulatory Visit: Payer: Self-pay | Admitting: Hematology and Oncology

## 2021-09-05 ENCOUNTER — Inpatient Hospital Stay: Payer: Medicaid Other

## 2021-09-05 ENCOUNTER — Other Ambulatory Visit (HOSPITAL_COMMUNITY): Payer: Self-pay

## 2021-09-05 ENCOUNTER — Other Ambulatory Visit: Payer: Self-pay | Admitting: Pharmacist

## 2021-09-05 DIAGNOSIS — D696 Thrombocytopenia, unspecified: Secondary | ICD-10-CM

## 2021-09-05 LAB — CBC AND DIFFERENTIAL
HCT: 34 — AB (ref 36–46)
Hemoglobin: 11.4 — AB (ref 12.0–16.0)
Neutrophils Absolute: 1.53
Platelets: 67 — AB (ref 150–399)
WBC: 3

## 2021-09-05 LAB — CBC: RBC: 3.88 (ref 3.87–5.11)

## 2021-09-05 NOTE — Progress Notes (Signed)
Spoke with [facility] and scheduled transportation for DOS 01/03, 01/04, 01/09, and 01/11.

## 2021-09-06 ENCOUNTER — Inpatient Hospital Stay: Payer: Medicaid Other

## 2021-09-06 ENCOUNTER — Other Ambulatory Visit: Payer: Self-pay

## 2021-09-06 VITALS — BP 130/75 | HR 96 | Temp 97.7°F | Resp 18 | Ht 59.0 in | Wt 135.2 lb

## 2021-09-06 DIAGNOSIS — D696 Thrombocytopenia, unspecified: Secondary | ICD-10-CM

## 2021-09-06 DIAGNOSIS — D693 Immune thrombocytopenic purpura: Secondary | ICD-10-CM | POA: Diagnosis not present

## 2021-09-06 MED ORDER — ROMIPLOSTIM 250 MCG ~~LOC~~ SOLR
4.3000 ug/kg | Freq: Once | SUBCUTANEOUS | Status: AC
  Administered 2021-09-06: 250 ug via SUBCUTANEOUS
  Filled 2021-09-06: qty 0.5

## 2021-09-06 NOTE — Patient Instructions (Signed)
Romiplostim injection ?What is this medication? ?ROMIPLOSTIM (roe mi PLOE stim) helps your body make more platelets. This medicine is used to treat low platelets caused by chronic idiopathic thrombocytopenic purpura (ITP) or a bone marrow syndrome caused by radiation sickness. ?This medicine may be used for other purposes; ask your health care provider or pharmacist if you have questions. ?COMMON BRAND NAME(S): Nplate ?What should I tell my care team before I take this medication? ?They need to know if you have any of these conditions: ?blood clots ?myelodysplastic syndrome ?an unusual or allergic reaction to romiplostim, mannitol, other medicines, foods, dyes, or preservatives ?pregnant or trying to get pregnant ?breast-feeding ?How should I use this medication? ?This medicine is injected under the skin. It is given by a health care provider in a hospital or clinic setting. ?A special MedGuide will be given to you before each treatment. Be sure to read this information carefully each time. ?Talk to your health care provider about the use of this medicine in children. While it may be prescribed for children as young as newborns for selected conditions, precautions do apply. ?Overdosage: If you think you have taken too much of this medicine contact a poison control center or emergency room at once. ?NOTE: This medicine is only for you. Do not share this medicine with others. ?What if I miss a dose? ?Keep appointments for follow-up doses. It is important not to miss your dose. Call your health care provider if you are unable to keep an appointment. ?What may interact with this medication? ?Interactions are not expected. ?This list may not describe all possible interactions. Give your health care provider a list of all the medicines, herbs, non-prescription drugs, or dietary supplements you use. Also tell them if you smoke, drink alcohol, or use illegal drugs. Some items may interact with your medicine. ?What should I  watch for while using this medication? ?Visit your health care provider for regular checks on your progress. You may need blood work done while you are taking this medicine. Your condition will be monitored carefully while you are receiving this medicine. It is important not to miss any appointments. ?What side effects may I notice from receiving this medication? ?Side effects that you should report to your doctor or health care professional as soon as possible: ?allergic reactions (skin rash, itching or hives; swelling of the face, lips, or tongue) ?bleeding (bloody or black, tarry stools; red or dark brown urine; spitting up blood or brown material that looks like coffee grounds; red spots on the skin; unusual bruising or bleeding from the eyes, gums, or nose) ?blood clot (chest pain; shortness of breath; pain, swelling, or warmth in the leg) ?stroke (changes in vision; confusion; trouble speaking or understanding; severe headaches; sudden numbness or weakness of the face, arm or leg; trouble walking; dizziness; loss of balance or coordination) ?Side effects that usually do not require medical attention (report to your doctor or health care professional if they continue or are bothersome): ?diarrhea ?dizziness ?headache ?joint pain ?muscle pain ?stomach pain ?trouble sleeping ?This list may not describe all possible side effects. Call your doctor for medical advice about side effects. You may report side effects to FDA at 1-800-FDA-1088. ?Where should I keep my medication? ?This medicine is given in a hospital or clinic. It will not be stored at home. ?NOTE: This sheet is a summary. It may not cover all possible information. If you have questions about this medicine, talk to your doctor, pharmacist, or health care provider. ??   2022 Elsevier/Gold Standard (2021-05-15 00:00:00) ? ?

## 2021-09-10 ENCOUNTER — Other Ambulatory Visit (HOSPITAL_COMMUNITY): Payer: Self-pay

## 2021-09-10 ENCOUNTER — Other Ambulatory Visit: Payer: Self-pay | Admitting: Internal Medicine

## 2021-09-10 DIAGNOSIS — Z9889 Other specified postprocedural states: Secondary | ICD-10-CM

## 2021-09-10 DIAGNOSIS — M21621 Bunionette of right foot: Secondary | ICD-10-CM

## 2021-09-10 DIAGNOSIS — M79671 Pain in right foot: Secondary | ICD-10-CM

## 2021-09-11 ENCOUNTER — Other Ambulatory Visit: Payer: Self-pay | Admitting: Pharmacist

## 2021-09-11 ENCOUNTER — Inpatient Hospital Stay: Payer: Commercial Managed Care - HMO | Attending: Hematology & Oncology

## 2021-09-11 ENCOUNTER — Other Ambulatory Visit: Payer: Self-pay | Admitting: Hematology and Oncology

## 2021-09-11 DIAGNOSIS — D693 Immune thrombocytopenic purpura: Secondary | ICD-10-CM | POA: Insufficient documentation

## 2021-09-11 DIAGNOSIS — D696 Thrombocytopenia, unspecified: Secondary | ICD-10-CM

## 2021-09-11 LAB — CBC AND DIFFERENTIAL
HCT: 37 (ref 36–46)
Hemoglobin: 12 (ref 12.0–16.0)
Neutrophils Absolute: 2.04
Platelets: 62 — AB (ref 150–399)
WBC: 3.4

## 2021-09-11 LAB — CBC: RBC: 4.28 (ref 3.87–5.11)

## 2021-09-12 ENCOUNTER — Telehealth: Payer: Self-pay

## 2021-09-12 ENCOUNTER — Inpatient Hospital Stay: Payer: Commercial Managed Care - HMO

## 2021-09-12 NOTE — Telephone Encounter (Signed)
Pt unable to come today due to [facility] cancelling her for today.

## 2021-09-17 ENCOUNTER — Other Ambulatory Visit: Payer: Self-pay | Admitting: Hematology and Oncology

## 2021-09-17 ENCOUNTER — Inpatient Hospital Stay: Payer: Commercial Managed Care - HMO

## 2021-09-17 ENCOUNTER — Other Ambulatory Visit (HOSPITAL_COMMUNITY): Payer: Self-pay

## 2021-09-17 ENCOUNTER — Other Ambulatory Visit: Payer: Self-pay

## 2021-09-17 DIAGNOSIS — D696 Thrombocytopenia, unspecified: Secondary | ICD-10-CM

## 2021-09-17 LAB — CBC AND DIFFERENTIAL
HCT: 38 (ref 36–46)
Hemoglobin: 12.3 (ref 12.0–16.0)
Neutrophils Absolute: 1.53
Platelets: 36 — AB (ref 150–399)
WBC: 2.5

## 2021-09-17 LAB — CBC
MCV: 85 (ref 81–99)
RBC: 4.46 (ref 3.87–5.11)

## 2021-09-18 ENCOUNTER — Encounter: Payer: Self-pay | Admitting: Oncology

## 2021-09-18 NOTE — Progress Notes (Signed)
Sent in transportation request for DOS 09/24/2021 to [employer]. Called and scheduled transportation with [facility] for DOS 09/26/21, 10/01/21, 10/03/21, and 10/08/21.

## 2021-09-18 NOTE — Progress Notes (Incomplete)
[facility]  [address] [place],  [place]  [id number] [phone]  Clinic Day:  09/18/2021  Referring physician: No ref. provider found  This document serves as a record of services personally performed by Marice Potter, MD. It was created on their behalf by Curry,Lauren E, a trained medical scribe. The creation of this record is based on the scribe's personal observations and the provider's statements to them.  HISTORY OF PRESENT ILLNESS:  The patient is a 51 y.o. female  with thrombocytopenia.  Per past records, alcoholic cirrrhosis and secondary splenomegaly are major reasons behind her low platelets.  However, there has also been an immune component to where she was receiving IV Nplate in [place].  Over these past months, she has been receiving weekly Nplate to keep her platelets at satisfactory levels.  Overall, she has been doing well.  She denies having any bleeding/bruising issues which concern her for severe thrombocytopenia being an issue.  She claims she has not used any drugs recently, but still consumes alcohol.  She smells of alcohol today.    PHYSICAL EXAM:  There were no vitals taken for this visit. Wt Readings from Last 3 Encounters:  09/06/21 135 lb 4 oz (61.3 kg)  08/29/21 127 lb 8 oz (57.8 kg)  08/22/21 130 lb 0.6 oz (59 kg)   There is no height or weight on file to calculate BMI. Performance status (ECOG): 1 Physical Exam Constitutional:      Appearance: Normal appearance. She is not ill-appearing.     Comments: She smells of alcohol  HENT:     Mouth/Throat:     Mouth: Mucous membranes are moist.     Pharynx: Oropharynx is clear. No oropharyngeal exudate or posterior oropharyngeal erythema.  Cardiovascular:     Rate and Rhythm: Regular rhythm. Tachycardia present.     Heart sounds: No murmur heard.   No friction rub. No gallop.  Pulmonary:     Effort: Pulmonary effort is normal. No respiratory distress.     Breath  sounds: Normal breath sounds. No wheezing, rhonchi or rales.  Abdominal:     General: Abdomen is protuberant. Bowel sounds are normal. There is no distension.     Palpations: Abdomen is soft. There is no mass.     Tenderness: There is no abdominal tenderness.  Musculoskeletal:        General: No swelling.     Right lower leg: No edema.     Left lower leg: No edema.  Lymphadenopathy:     Cervical: No cervical adenopathy.     Upper Body:     Right upper body: No supraclavicular or axillary adenopathy.     Left upper body: No supraclavicular or axillary adenopathy.     Lower Body: No right inguinal adenopathy. No left inguinal adenopathy.  Skin:    General: Skin is warm.     Coloration: Skin is not jaundiced.     Findings: No lesion or rash.  Neurological:     General: No focal deficit present.     Mental Status: She is alert and oriented to person, place, and time. Mental status is at baseline.  Psychiatric:        Mood and Affect: Mood normal.        Behavior: Behavior normal.        Thought Content: Thought content normal.   LABS:     CMP Latest Ref Rng & Units 06/29/2021 06/28/2021 06/27/2021  Glucose 70 -  99 mg/dL 111(H) 163(H) 142(H)  BUN 6 - 20 mg/dL <5(L) <5(L) <5(L)  Creatinine 0.44 - 1.00 mg/dL 0.79 0.72 0.74  Sodium 135 - 145 mmol/L 133(L) 133(L) 133(L)  Potassium 3.5 - 5.1 mmol/L 3.7 3.7 3.2(L)  Chloride 98 - 111 mmol/L 94(L) 92(L) 91(L)  CO2 22 - 32 mmol/L 31 30 34(H)  Calcium 8.9 - 10.3 mg/dL 8.7(L) 8.0(L) 7.5(L)  Total Protein 6.5 - 8.1 g/dL - 6.8 -  Total Bilirubin 0.3 - 1.2 mg/dL - 10.2(H) -  Alkaline Phos 38 - 126 U/L - 237(H) -  AST 15 - 41 U/L - 163(H) -  ALT 0 - 44 U/L - 41 -   ASSESSMENT & PLAN:  A 51 y.o. 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  Her platelets are holding at decent levels with her weekly Nplate therapy, which she will continue to receive.  However, I am very  concerned with her worsening liver panel today.  Her continued alcohol consumption in undoubtedly contributing to her declining liver function.  I made it very clear to the patient that she is on the precipice of damaging her liver to the point where it will impact her mortality.  I will have her see a GI specialist in the forthcoming days to re-evaluate her clinical picture.   I will see her back in 4 months for repeat clinical assessment.  The patient understands all the plans discussed today and is in agreement with them.   I, Rita Ohara, am acting as scribe for Marice Potter, MD    I have reviewed this report as typed by the medical scribe, and it is complete and accurate.  Dequincy Macarthur Critchley, MD

## 2021-09-19 ENCOUNTER — Other Ambulatory Visit: Payer: Self-pay

## 2021-09-19 ENCOUNTER — Inpatient Hospital Stay: Payer: Commercial Managed Care - HMO

## 2021-09-19 VITALS — BP 139/85 | HR 86 | Temp 97.9°F | Resp 18 | Ht 59.0 in | Wt 132.0 lb

## 2021-09-19 DIAGNOSIS — D696 Thrombocytopenia, unspecified: Secondary | ICD-10-CM

## 2021-09-19 DIAGNOSIS — D693 Immune thrombocytopenic purpura: Secondary | ICD-10-CM | POA: Diagnosis not present

## 2021-09-19 MED ORDER — ROMIPLOSTIM 250 MCG ~~LOC~~ SOLR
4.1000 ug/kg | Freq: Once | SUBCUTANEOUS | Status: AC
  Administered 2021-09-19: 250 ug via SUBCUTANEOUS
  Filled 2021-09-19: qty 0.5

## 2021-09-19 NOTE — Patient Instructions (Signed)
Romiplostim injection ?What is this medication? ?ROMIPLOSTIM (roe mi PLOE stim) helps your body make more platelets. This medicine is used to treat low platelets caused by chronic idiopathic thrombocytopenic purpura (ITP) or a bone marrow syndrome caused by radiation sickness. ?This medicine may be used for other purposes; ask your health care provider or pharmacist if you have questions. ?COMMON BRAND NAME(S): Nplate ?What should I tell my care team before I take this medication? ?They need to know if you have any of these conditions: ?blood clots ?myelodysplastic syndrome ?an unusual or allergic reaction to romiplostim, mannitol, other medicines, foods, dyes, or preservatives ?pregnant or trying to get pregnant ?breast-feeding ?How should I use this medication? ?This medicine is injected under the skin. It is given by a health care provider in a hospital or clinic setting. ?A special MedGuide will be given to you before each treatment. Be sure to read this information carefully each time. ?Talk to your health care provider about the use of this medicine in children. While it may be prescribed for children as young as newborns for selected conditions, precautions do apply. ?Overdosage: If you think you have taken too much of this medicine contact a poison control center or emergency room at once. ?NOTE: This medicine is only for you. Do not share this medicine with others. ?What if I miss a dose? ?Keep appointments for follow-up doses. It is important not to miss your dose. Call your health care provider if you are unable to keep an appointment. ?What may interact with this medication? ?Interactions are not expected. ?This list may not describe all possible interactions. Give your health care provider a list of all the medicines, herbs, non-prescription drugs, or dietary supplements you use. Also tell them if you smoke, drink alcohol, or use illegal drugs. Some items may interact with your medicine. ?What should I  watch for while using this medication? ?Visit your health care provider for regular checks on your progress. You may need blood work done while you are taking this medicine. Your condition will be monitored carefully while you are receiving this medicine. It is important not to miss any appointments. ?What side effects may I notice from receiving this medication? ?Side effects that you should report to your doctor or health care professional as soon as possible: ?allergic reactions (skin rash, itching or hives; swelling of the face, lips, or tongue) ?bleeding (bloody or black, tarry stools; red or dark brown urine; spitting up blood or brown material that looks like coffee grounds; red spots on the skin; unusual bruising or bleeding from the eyes, gums, or nose) ?blood clot (chest pain; shortness of breath; pain, swelling, or warmth in the leg) ?stroke (changes in vision; confusion; trouble speaking or understanding; severe headaches; sudden numbness or weakness of the face, arm or leg; trouble walking; dizziness; loss of balance or coordination) ?Side effects that usually do not require medical attention (report to your doctor or health care professional if they continue or are bothersome): ?diarrhea ?dizziness ?headache ?joint pain ?muscle pain ?stomach pain ?trouble sleeping ?This list may not describe all possible side effects. Call your doctor for medical advice about side effects. You may report side effects to FDA at 1-800-FDA-1088. ?Where should I keep my medication? ?This medicine is given in a hospital or clinic. It will not be stored at home. ?NOTE: This sheet is a summary. It may not cover all possible information. If you have questions about this medicine, talk to your doctor, pharmacist, or health care provider. ??   2022 Elsevier/Gold Standard (2021-05-15 00:00:00) ? ?

## 2021-09-20 ENCOUNTER — Ambulatory Visit: Payer: Self-pay | Admitting: Plastic Surgery

## 2021-09-24 ENCOUNTER — Ambulatory Visit: Payer: Medicaid Other | Admitting: Oncology

## 2021-09-24 ENCOUNTER — Inpatient Hospital Stay: Payer: Commercial Managed Care - HMO

## 2021-09-25 DIAGNOSIS — D693 Immune thrombocytopenic purpura: Secondary | ICD-10-CM

## 2021-09-26 ENCOUNTER — Inpatient Hospital Stay: Payer: Commercial Managed Care - HMO

## 2021-09-27 ENCOUNTER — Other Ambulatory Visit (HOSPITAL_COMMUNITY): Payer: Self-pay

## 2021-09-27 MED ORDER — AMOXICILLIN-POT CLAVULANATE 500-125 MG PO TABS
ORAL_TABLET | ORAL | 0 refills | Status: DC
  Filled 2021-09-27: qty 15, 5d supply, fill #0

## 2021-09-27 MED ORDER — MULTIVITAMIN ADULT (MINERALS) PO TABS: ORAL_TABLET | ORAL | 0 refills | Status: DC

## 2021-09-27 MED ORDER — CULTURELLE PO CAPS: ORAL_CAPSULE | ORAL | 0 refills | Status: DC

## 2021-09-27 MED ORDER — THIAMINE MONONITRATE 100 MG PO TABS: ORAL_TABLET | ORAL | 0 refills | Status: DC

## 2021-09-28 ENCOUNTER — Other Ambulatory Visit (HOSPITAL_COMMUNITY): Payer: Self-pay

## 2021-10-01 ENCOUNTER — Other Ambulatory Visit: Payer: Self-pay

## 2021-10-01 ENCOUNTER — Other Ambulatory Visit (HOSPITAL_COMMUNITY): Payer: Self-pay

## 2021-10-01 ENCOUNTER — Other Ambulatory Visit: Payer: Self-pay | Admitting: Hematology and Oncology

## 2021-10-01 ENCOUNTER — Inpatient Hospital Stay: Payer: Commercial Managed Care - HMO

## 2021-10-01 DIAGNOSIS — D696 Thrombocytopenia, unspecified: Secondary | ICD-10-CM

## 2021-10-01 LAB — CBC AND DIFFERENTIAL
HCT: 35 — AB (ref 36–46)
Hemoglobin: 11.4 — AB (ref 12.0–16.0)
Neutrophils Absolute: 0.87
Platelets: 66 — AB (ref 150–399)
WBC: 2.3

## 2021-10-01 LAB — CBC: RBC: 4.02 (ref 3.87–5.11)

## 2021-10-01 NOTE — Progress Notes (Signed)
Called and set up transportation with [facility] for DOS 10/10/21, 10/15/21, and 10/17/21.

## 2021-10-03 ENCOUNTER — Other Ambulatory Visit: Payer: Self-pay

## 2021-10-03 ENCOUNTER — Other Ambulatory Visit (HOSPITAL_COMMUNITY): Payer: Self-pay

## 2021-10-03 ENCOUNTER — Inpatient Hospital Stay: Payer: Commercial Managed Care - HMO

## 2021-10-03 VITALS — BP 107/73 | HR 96 | Resp 18 | Ht 59.0 in | Wt 140.0 lb

## 2021-10-03 DIAGNOSIS — D693 Immune thrombocytopenic purpura: Secondary | ICD-10-CM | POA: Diagnosis not present

## 2021-10-03 DIAGNOSIS — D696 Thrombocytopenia, unspecified: Secondary | ICD-10-CM

## 2021-10-03 MED ORDER — ROMIPLOSTIM 250 MCG ~~LOC~~ SOLR
4.2000 ug/kg | Freq: Once | SUBCUTANEOUS | Status: AC
  Administered 2021-10-03: 250 ug via SUBCUTANEOUS
  Filled 2021-10-03: qty 0.5

## 2021-10-03 NOTE — Patient Instructions (Signed)
Romiplostim injection ?What is this medication? ?ROMIPLOSTIM (roe mi PLOE stim) helps your body make more platelets. This medicine is used to treat low platelets caused by chronic idiopathic thrombocytopenic purpura (ITP) or a bone marrow syndrome caused by radiation sickness. ?This medicine may be used for other purposes; ask your health care provider or pharmacist if you have questions. ?COMMON BRAND NAME(S): Nplate ?What should I tell my care team before I take this medication? ?They need to know if you have any of these conditions: ?blood clots ?myelodysplastic syndrome ?an unusual or allergic reaction to romiplostim, mannitol, other medicines, foods, dyes, or preservatives ?pregnant or trying to get pregnant ?breast-feeding ?How should I use this medication? ?This medicine is injected under the skin. It is given by a health care provider in a hospital or clinic setting. ?A special MedGuide will be given to you before each treatment. Be sure to read this information carefully each time. ?Talk to your health care provider about the use of this medicine in children. While it may be prescribed for children as young as newborns for selected conditions, precautions do apply. ?Overdosage: If you think you have taken too much of this medicine contact a poison control center or emergency room at once. ?NOTE: This medicine is only for you. Do not share this medicine with others. ?What if I miss a dose? ?Keep appointments for follow-up doses. It is important not to miss your dose. Call your health care provider if you are unable to keep an appointment. ?What may interact with this medication? ?Interactions are not expected. ?This list may not describe all possible interactions. Give your health care provider a list of all the medicines, herbs, non-prescription drugs, or dietary supplements you use. Also tell them if you smoke, drink alcohol, or use illegal drugs. Some items may interact with your medicine. ?What should I  watch for while using this medication? ?Visit your health care provider for regular checks on your progress. You may need blood work done while you are taking this medicine. Your condition will be monitored carefully while you are receiving this medicine. It is important not to miss any appointments. ?What side effects may I notice from receiving this medication? ?Side effects that you should report to your doctor or health care professional as soon as possible: ?allergic reactions (skin rash, itching or hives; swelling of the face, lips, or tongue) ?bleeding (bloody or black, tarry stools; red or dark brown urine; spitting up blood or brown material that looks like coffee grounds; red spots on the skin; unusual bruising or bleeding from the eyes, gums, or nose) ?blood clot (chest pain; shortness of breath; pain, swelling, or warmth in the leg) ?stroke (changes in vision; confusion; trouble speaking or understanding; severe headaches; sudden numbness or weakness of the face, arm or leg; trouble walking; dizziness; loss of balance or coordination) ?Side effects that usually do not require medical attention (report to your doctor or health care professional if they continue or are bothersome): ?diarrhea ?dizziness ?headache ?joint pain ?muscle pain ?stomach pain ?trouble sleeping ?This list may not describe all possible side effects. Call your doctor for medical advice about side effects. You may report side effects to FDA at 1-800-FDA-1088. ?Where should I keep my medication? ?This medicine is given in a hospital or clinic. It will not be stored at home. ?NOTE: This sheet is a summary. It may not cover all possible information. If you have questions about this medicine, talk to your doctor, pharmacist, or health care provider. ??   2022 Elsevier/Gold Standard (2021-05-15 00:00:00) ? ?

## 2021-10-08 ENCOUNTER — Other Ambulatory Visit: Payer: Self-pay

## 2021-10-08 ENCOUNTER — Inpatient Hospital Stay: Payer: Commercial Managed Care - HMO

## 2021-10-08 ENCOUNTER — Other Ambulatory Visit: Payer: Self-pay | Admitting: Hematology and Oncology

## 2021-10-08 DIAGNOSIS — D696 Thrombocytopenia, unspecified: Secondary | ICD-10-CM

## 2021-10-08 LAB — CBC AND DIFFERENTIAL
HCT: 35 — AB (ref 36–46)
Hemoglobin: 11.2 — AB (ref 12.0–16.0)
Neutrophils Absolute: 2.04
Platelets: 86 — AB (ref 150–399)
WBC: 3.4

## 2021-10-08 LAB — CBC: RBC: 4.02 (ref 3.87–5.11)

## 2021-10-08 NOTE — Addendum Note (Signed)
Addended by: Juanetta Beets on: 10/08/2021 04:23 PM   Modules accepted: Orders

## 2021-10-10 ENCOUNTER — Other Ambulatory Visit: Payer: Self-pay

## 2021-10-10 ENCOUNTER — Inpatient Hospital Stay: Payer: Commercial Managed Care - HMO | Attending: Hematology & Oncology

## 2021-10-10 VITALS — BP 132/83 | HR 109 | Resp 16 | Ht 59.0 in | Wt 137.8 lb

## 2021-10-10 DIAGNOSIS — D696 Thrombocytopenia, unspecified: Secondary | ICD-10-CM

## 2021-10-10 DIAGNOSIS — D693 Immune thrombocytopenic purpura: Secondary | ICD-10-CM | POA: Insufficient documentation

## 2021-10-10 MED ORDER — ROMIPLOSTIM 250 MCG ~~LOC~~ SOLR
3.9500 ug/kg | Freq: Once | SUBCUTANEOUS | Status: AC
  Administered 2021-10-10: 250 ug via SUBCUTANEOUS
  Filled 2021-10-10: qty 0.5

## 2021-10-10 NOTE — Progress Notes (Signed)
1258:PT STABLE AT TIME OF DISCHARGE

## 2021-10-10 NOTE — Patient Instructions (Signed)
Romiplostim injection ?What is this medication? ?ROMIPLOSTIM (roe mi PLOE stim) helps your body make more platelets. This medicine is used to treat low platelets caused by chronic idiopathic thrombocytopenic purpura (ITP) or a bone marrow syndrome caused by radiation sickness. ?This medicine may be used for other purposes; ask your health care provider or pharmacist if you have questions. ?COMMON BRAND NAME(S): Nplate ?What should I tell my care team before I take this medication? ?They need to know if you have any of these conditions: ?blood clots ?myelodysplastic syndrome ?an unusual or allergic reaction to romiplostim, mannitol, other medicines, foods, dyes, or preservatives ?pregnant or trying to get pregnant ?breast-feeding ?How should I use this medication? ?This medicine is injected under the skin. It is given by a health care provider in a hospital or clinic setting. ?A special MedGuide will be given to you before each treatment. Be sure to read this information carefully each time. ?Talk to your health care provider about the use of this medicine in children. While it may be prescribed for children as young as newborns for selected conditions, precautions do apply. ?Overdosage: If you think you have taken too much of this medicine contact a poison control center or emergency room at once. ?NOTE: This medicine is only for you. Do not share this medicine with others. ?What if I miss a dose? ?Keep appointments for follow-up doses. It is important not to miss your dose. Call your health care provider if you are unable to keep an appointment. ?What may interact with this medication? ?Interactions are not expected. ?This list may not describe all possible interactions. Give your health care provider a list of all the medicines, herbs, non-prescription drugs, or dietary supplements you use. Also tell them if you smoke, drink alcohol, or use illegal drugs. Some items may interact with your medicine. ?What should I  watch for while using this medication? ?Visit your health care provider for regular checks on your progress. You may need blood work done while you are taking this medicine. Your condition will be monitored carefully while you are receiving this medicine. It is important not to miss any appointments. ?What side effects may I notice from receiving this medication? ?Side effects that you should report to your doctor or health care professional as soon as possible: ?allergic reactions (skin rash, itching or hives; swelling of the face, lips, or tongue) ?bleeding (bloody or black, tarry stools; red or dark brown urine; spitting up blood or brown material that looks like coffee grounds; red spots on the skin; unusual bruising or bleeding from the eyes, gums, or nose) ?blood clot (chest pain; shortness of breath; pain, swelling, or warmth in the leg) ?stroke (changes in vision; confusion; trouble speaking or understanding; severe headaches; sudden numbness or weakness of the face, arm or leg; trouble walking; dizziness; loss of balance or coordination) ?Side effects that usually do not require medical attention (report to your doctor or health care professional if they continue or are bothersome): ?diarrhea ?dizziness ?headache ?joint pain ?muscle pain ?stomach pain ?trouble sleeping ?This list may not describe all possible side effects. Call your doctor for medical advice about side effects. You may report side effects to FDA at 1-800-FDA-1088. ?Where should I keep my medication? ?This medicine is given in a hospital or clinic. It will not be stored at home. ?NOTE: This sheet is a summary. It may not cover all possible information. If you have questions about this medicine, talk to your doctor, pharmacist, or health care provider. ??   2022 Elsevier/Gold Standard (2021-05-15 00:00:00) ? ?

## 2021-10-11 ENCOUNTER — Encounter: Payer: Self-pay | Admitting: Cardiology

## 2021-10-11 ENCOUNTER — Ambulatory Visit (INDEPENDENT_AMBULATORY_CARE_PROVIDER_SITE_OTHER): Payer: Medicaid Other | Admitting: Cardiology

## 2021-10-11 VITALS — BP 120/70 | HR 104 | Ht 59.0 in | Wt 137.0 lb

## 2021-10-11 DIAGNOSIS — R002 Palpitations: Secondary | ICD-10-CM

## 2021-10-11 DIAGNOSIS — K766 Portal hypertension: Secondary | ICD-10-CM | POA: Diagnosis not present

## 2021-10-11 DIAGNOSIS — F1014 Alcohol abuse with alcohol-induced mood disorder: Secondary | ICD-10-CM | POA: Diagnosis not present

## 2021-10-11 NOTE — Patient Instructions (Signed)
Medication Instructions:  Your physician recommends that you continue on your current medications as directed. Please refer to the Current Medication list given to you today.  *If you need a refill on your cardiac medications before your next appointment, please call your pharmacy*   Lab Work: None If you have labs (blood work) drawn today and your tests are completely normal, you will receive your results only by: [facility]ena (if you have MyChart) OR A paper copy in the mail If you have any lab test that is abnormal or we need to change your treatment, we will call you to review the results.   Testing/Procedures: None   Follow-Up: At [facility], you and your health needs are our priority.  As part of our continuing mission to provide you with exceptional heart care, we have created designated Provider Care Teams.  These Care Teams include your primary Cardiologist (physician) and Advanced Practice Providers (APPs -  Physician Assistants and Nurse Practitioners) who all work together to provide you with the care you need, when you need it.  We recommend signing up for the patient portal called "MyChart".  Sign up information is provided on this After Visit Summary.  MyChart is used to connect with patients for Virtual Visits (Telemedicine).  Patients are able to view lab/test results, encounter notes, upcoming appointments, etc.  Non-urgent messages can be sent to your provider as well.   To learn more about what you can do with MyChart, go to [email or web address].    Your next appointment:   1 year(s)  The format for your next appointment:   In Person  Provider:   Jenne Campus, MD    Other Instructions None

## 2021-10-11 NOTE — Progress Notes (Signed)
Cardiology Office Note:    Date:  10/11/2021   ID:  Briana Sweeney, DOB February 18, 1971, MRN [id number]  PCP:  [name], FNP  Cardiologist:  Jenne Campus, MD    Referring MD: No ref. provider found   Chief Complaint  Patient presents with   Follow-up  I am doing fine  History of Present Illness:    Briana Sweeney is a 51 y.o. female with past medical history significant for seizure disorder, alcoholism, esophageal varicosis, have liver cirrhosis recently she was in the hospital because of seizures.  Another complaint that she had initially reason why she is in my office and the fact that she does have apple watch that sometimes tell her her heart rate is 45 and sometimes go to 130 recently she wore a monitor which showed no critical bradycardia no AV blocking, she did have few short lasting episode of supraventricular tachycardia but overall monitor looks fairly benign.  She denies have any dizziness or passing out but she is an alcoholic d she drinks vodka every single day.  She also got some psychiatric issue.  She is being followed by psychiatry for this.  Denies have any chest pain tightness squeezing pressure burning chest.  She does have some exertional shortness of breath.    Past Medical History:  Diagnosis Date   Abscess of bursa of left elbow 09/23/2018   Acute hyperactive alcohol withdrawal delirium ([place]) 10/09/2017   Acute low back pain with bilateral sciatica    Acute metabolic encephalopathy 16/60/6301   Acute on chronic pancreatitis ([place]) 04/04/2021   AKI (acute kidney injury) ([place]) 04/04/2021   Alcohol withdrawal syndrome with complication ([place]) 60/06/9322   Alcohol-induced acute pancreatitis    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[place]) 12/05/2018   Ascites due to alcoholic cirrhosis ([place])    Bipolar affective disorder ([place])    With anxiety features   Bunionette of right foot 11/2019   Cirrhosis of liver ([place])    Due to alcohol and hepatitis C   Cocaine  dependence without complication ([place]) 55/73/2202   Decompensated hepatic cirrhosis ([place]) 07/23/2017   Depression with anxiety    Epistaxis 01/20/2019   Erysipelas 09/13/2018   Esophageal 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 ([place])    Esophageal varices without bleeding (HCC)    ETOHism (HCC)    GERD (gastroesophageal reflux disease)    Head injury    Hematemesis 02/10/2018   Hepatitis C 2018   hepatitis c and alcohol related hepatitis   Heroin abuse ([place])    History of blood transfusion    "blood doesn't clot; I fell down and had to have a transfusion"   History of kidney stones    Menopause 2016   Migraine    "when I get really stressed" (09/01/2017)   Neuropathy 10/27/2018   Olecranon bursitis of left elbow    Other mixed anxiety disorders 10/27/2018   Overdose 08/22/2018   Pancytopenia ([place]) 07/23/2017   Polysubstance abuse ([place]) 04/08/2018   Portal hypertension ([place]) 03/18/2018   S/P foot surgery, right 11/2019   Schizophrenia ([place])    Seizures ([place])    "when I run out of my RX; lots recently" (09/01/2017)   Suicidal ideation    Thrombocytopenia ([place])    Trichimoniasis 10/30/2017   Upper GI bleed 02/10/2018   Urinary urgency 05/2020   UTI (urinary tract infection) 06/02/2017    Past Surgical History:  Procedure Laterality Date   BUNIONECTOMY     ESOPHAGEAL BANDING  06/26/2021   Procedure: ESOPHAGEAL BANDING;  Surgeon: Jackquline Denmark, MD;  Location: [facility] ENDOSCOPY;  Service: Endoscopy;;   ESOPHAGOGASTRODUODENOSCOPY N/A 09/03/2017   Procedure: ESOPHAGOGASTRODUODENOSCOPY (EGD);  Surgeon: Doran Stabler, MD;  Location: [place];  Service: Gastroenterology;  Laterality: N/A;   ESOPHAGOGASTRODUODENOSCOPY (EGD) WITH PROPOFOL N/A 06/26/2021   Procedure: ESOPHAGOGASTRODUODENOSCOPY (EGD) WITH PROPOFOL;  Surgeon: Jackquline Denmark, MD;  Location: [facility] ENDOSCOPY;  Service: Endoscopy;  Laterality: N/A;   FINGER FRACTURE SURGERY Left    "shattered my pinky"   FRACTURE SURGERY     I & D  EXTREMITY Left 09/18/2018   Procedure: IRRIGATION AND DEBRIDEMENT EXTREMITY;  Surgeon: Leanora Cover, MD;  Location: [facility] ORS;  Service: Orthopedics;  Laterality: Left;   IR PARACENTESIS  07/23/2017   IR PARACENTESIS  07/2017   "did it twice in the same week" (09/01/2017)   IR PARACENTESIS  06/26/2021   SHOULDER OPEN ROTATOR CUFF REPAIR Right    TOOTH EXTRACTION  08/2019   TUBAL LIGATION     VAGINAL HYSTERECTOMY      Current Medications:    Allergies:   Other   Social History   Socioeconomic History   Marital status: Divorced    Spouse name: Not on file   Number of children: 2   Years of education: Not on file   Highest education level: Not on file  Occupational History   Occupation: applying for disability  Tobacco Use   Smoking status: Never   Smokeless tobacco: Never  Vaping Use   Vaping Use: Never used  Substance and Sexual Activity   Alcohol use: Not Currently    Alcohol/week: 6.0 standard drinks    Types: 6 Cans of beer per week    Comment: weekly   Drug use: Not Currently   Sexual activity: Not on file  Other Topics Concern   Not on file  Social History Narrative   She moved with a boyfriend to [place] and was followed at [facility]  He died of a massive heart attack in 8/18, per her report, and so she moved back to [place] and is living with a friend.      Right handed   2 story home    Lives alone 11/09/19   Social Determinants of Health   Financial Resource Strain: Not on file  Food Insecurity: Not on file  Transportation Needs: Not on file  Physical Activity: Not on file  Stress: Not on file  Social Connections: Not on file     Family History: The patient's family history includes Alcohol abuse in her mother; Lung cancer (age of onset: 9) in her mother; Throat cancer (age of onset: 55) in her father. ROS:   Please see the history of present illness.    All 14 point review of systems negative except as described per history of present  illness  EKGs/Labs/Other Studies Reviewed:      Recent Labs: 12/18/2020: TSH 0.617 06/28/2021: ALT 41 06/29/2021: BUN <5; Creatinine, Ser 0.79; Magnesium 1.4; Potassium 3.7; Sodium 133 10/08/2021: Hemoglobin 11.2; Platelets 86  Recent Lipid Panel    Component Value Date/Time   CHOL 213 (H) 12/13/2019 1603   TRIG 85 12/13/2019 1603   HDL 108 12/13/2019 1603   CHOLHDL 2.0 12/13/2019 1603   LDLCALC 90 12/13/2019 1603    Physical Exam:    VS:  BP 120/70 (BP Location: Right Arm, Patient Position: Sitting, Cuff Size: Normal)    Pulse (!) 104    Ht 4\' 11"  (1.499 m)    Wt 137 lb (  62.1 kg)    SpO2 92%    BMI 27.67 kg/m     Wt Readings from Last 3 Encounters:  10/11/21 137 lb (62.1 kg)  10/10/21 137 lb 12 oz (62.5 kg)  10/03/21 140 lb (63.5 kg)     GEN:  Well nourished, well developed in no acute distress HEENT: Normal NECK: No JVD; No carotid bruits LYMPHATICS: No lymphadenopathy CARDIAC: RRR, no murmurs, no rubs, no gallops RESPIRATORY:  Clear to auscultation without rales, wheezing or rhonchi  ABDOMEN: Soft, non-tender, non-distended MUSCULOSKELETAL:  No edema; No deformity  SKIN: Warm and dry LOWER EXTREMITIES: no swelling NEUROLOGIC:  Alert and oriented x 3 PSYCHIATRIC:  Normal affect   ASSESSMENT:    1. Portal hypertension (HCC)   2. Palpitations   3. Alcohol abuse with alcohol-induced mood disorder ([place])    PLAN:    In order of problems listed above:  Palpitations.  Monitor did not show any critical arrhythmia.  HL heart rate between 70 and 164 with mean heart rate 116.  There were multiple triggered showing sinus tachycardia.  Brief episodes of atrial tachycardia otherwise benign monitor.  I do not think she needs any therapy for it.  I did review record from the hospital which showed preserved left ventricle ejection fraction. Alcohol abuse which obviously is huge problem.  She goes to see psychotherapy for her psychological problems that being already discussed  the alcohol issue I told her she must quit she said she understand but she said she does not think she will able to do it. Portal hypertension with liver cirrhosis.  That will be followed by internal medicine team.  I reviewed records from the hospital for this visit Medication Adjustments/Labs and Tests Ordered: Current medicines are reviewed at length with the patient today.  Concerns regarding medicines are outlined above.  No orders of the defined types were placed in this encounter.  Medication changes: No orders of the defined types were placed in this encounter.   Signed, Park Liter, MD, [facility] 10/11/2021 10:58 AM    [place]

## 2021-10-11 NOTE — Progress Notes (Signed)
Spoke with [facility] and set up transportation for DOS 10/22/2021, 10/24/2021, 10/29/2021, and 10/31/2021.

## 2021-10-15 ENCOUNTER — Inpatient Hospital Stay: Payer: Commercial Managed Care - HMO

## 2021-10-15 ENCOUNTER — Other Ambulatory Visit: Payer: Self-pay | Admitting: Hematology and Oncology

## 2021-10-15 ENCOUNTER — Other Ambulatory Visit (HOSPITAL_COMMUNITY): Payer: Self-pay

## 2021-10-15 ENCOUNTER — Other Ambulatory Visit: Payer: Self-pay

## 2021-10-15 ENCOUNTER — Encounter: Payer: Self-pay | Admitting: Oncology

## 2021-10-15 DIAGNOSIS — D696 Thrombocytopenia, unspecified: Secondary | ICD-10-CM

## 2021-10-15 LAB — CBC AND DIFFERENTIAL
HCT: 37 (ref 36–46)
Hemoglobin: 12.2 (ref 12.0–16.0)
Neutrophils Absolute: 1.15
Platelets: 56 — AB (ref 150–399)
WBC: 2.8

## 2021-10-15 LAB — CBC: RBC: 4.37 (ref 3.87–5.11)

## 2021-10-16 ENCOUNTER — Ambulatory Visit: Payer: Medicaid Other | Admitting: Cardiology

## 2021-10-16 NOTE — Progress Notes (Incomplete)
[facility]  [address] [place],  [place]  [address] [phone]  Clinic Day:  10/16/2021  Referring physician: No ref. provider found  This document serves as a record of services personally performed by Marice Potter, MD. It was created on their behalf by Curry,Lauren E, a trained medical scribe. The creation of this record is based on the scribe's personal observations and the provider's statements to them.  HISTORY OF PRESENT ILLNESS:  The patient is a 51 y.o. female  with thrombocytopenia.  Per past records, alcoholic cirrrhosis and secondary splenomegaly are major reasons behind her low platelets.  However, there has also been an immune component to where she was receiving IV Nplate in [place].  Over these past months, she has been receiving weekly Nplate to keep her platelets at satisfactory levels.  Overall, she has been doing well.  She denies having any bleeding/bruising issues which concern her for severe thrombocytopenia being an issue.  She claims she has not used any drugs recently, but still consumes alcohol.     PHYSICAL EXAM:  There were no vitals taken for this visit. Wt Readings from Last 3 Encounters:  10/11/21 137 lb (62.1 kg)  10/10/21 137 lb 12 oz (62.5 kg)  10/03/21 140 lb (63.5 kg)   There is no height or weight on file to calculate BMI. Performance status (ECOG): 1 Physical Exam Constitutional:      General: She is not in acute distress.    Appearance: Normal appearance. She is normal weight.  HENT:     Head: Normocephalic and atraumatic.  Eyes:     General: No scleral icterus.    Extraocular Movements: Extraocular movements intact.     Conjunctiva/sclera: Conjunctivae normal.     Pupils: Pupils are equal, round, and reactive to light.  Cardiovascular:     Rate and Rhythm: Normal rate and regular rhythm.     Pulses: Normal pulses.     Heart sounds: Normal heart sounds. No murmur heard.   No friction rub. No  gallop.  Pulmonary:     Effort: Pulmonary effort is normal. No respiratory distress.     Breath sounds: Normal breath sounds.  Abdominal:     General: Bowel sounds are normal. There is no distension.     Palpations: Abdomen is soft. There is no hepatomegaly, splenomegaly or mass.     Tenderness: There is no abdominal tenderness.  Musculoskeletal:        General: Normal range of motion.     Cervical back: Normal range of motion and neck supple.     Right lower leg: No edema.     Left lower leg: No edema.  Lymphadenopathy:     Cervical: No cervical adenopathy.  Skin:    General: Skin is warm and dry.  Neurological:     General: No focal deficit present.     Mental Status: She is alert and oriented to person, place, and time. Mental status is at baseline.  Psychiatric:        Mood and Affect: Mood normal.        Behavior: Behavior normal.        Thought Content: Thought content normal.        Judgment: Judgment normal.   LABS:     CMP Latest Ref Rng & Units 06/29/2021 06/28/2021 06/27/2021  Glucose 70 - 99 mg/dL 111(H) 163(H) 142(H)  BUN 6 - 20 mg/dL <5(L) <5(L) <5(L)  Creatinine 0.44 - 1.00  mg/dL 0.79 0.72 0.74  Sodium 135 - 145 mmol/L 133(L) 133(L) 133(L)  Potassium 3.5 - 5.1 mmol/L 3.7 3.7 3.2(L)  Chloride 98 - 111 mmol/L 94(L) 92(L) 91(L)  CO2 22 - 32 mmol/L 31 30 34(H)  Calcium 8.9 - 10.3 mg/dL 8.7(L) 8.0(L) 7.5(L)  Total Protein 6.5 - 8.1 g/dL - 6.8 -  Total Bilirubin 0.3 - 1.2 mg/dL - 10.2(H) -  Alkaline Phos 38 - 126 U/L - 237(H) -  AST 15 - 41 U/L - 163(H) -  ALT 0 - 44 U/L - 41 -   ASSESSMENT & PLAN:  A 51 y.o. 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  Her platelets are holding at decent levels with her weekly Nplate therapy, which she will continue to receive.  However, I am very concerned with her worsening liver panel today.  Her continued alcohol consumption in undoubtedly contributing to  her declining liver function.  I made it very clear to the patient that she is on the precipice of damaging her liver to the point where it will impact her mortality.  I will have her see a GI specialist in the forthcoming days to re-evaluate her clinical picture.   I will see her back in 4 months for repeat clinical assessment.  The patient understands all the plans discussed today and is in agreement with them.   I, Rita Ohara, am acting as scribe for Marice Potter, MD    I have reviewed this report as typed by the medical scribe, and it is complete and accurate.  Dequincy Macarthur Critchley, MD

## 2021-10-17 ENCOUNTER — Other Ambulatory Visit: Payer: Self-pay

## 2021-10-17 ENCOUNTER — Other Ambulatory Visit (HOSPITAL_COMMUNITY): Payer: Self-pay

## 2021-10-17 ENCOUNTER — Inpatient Hospital Stay: Payer: Commercial Managed Care - HMO

## 2021-10-17 ENCOUNTER — Encounter: Payer: Self-pay | Admitting: Oncology

## 2021-10-17 VITALS — BP 128/87 | HR 124 | Temp 98.3°F | Resp 18 | Ht 59.0 in | Wt 137.0 lb

## 2021-10-17 DIAGNOSIS — D696 Thrombocytopenia, unspecified: Secondary | ICD-10-CM

## 2021-10-17 DIAGNOSIS — D693 Immune thrombocytopenic purpura: Secondary | ICD-10-CM | POA: Diagnosis not present

## 2021-10-17 MED ORDER — ROMIPLOSTIM 250 MCG ~~LOC~~ SOLR
4.0000 ug/kg | Freq: Once | SUBCUTANEOUS | Status: AC
  Administered 2021-10-17: 250 ug via SUBCUTANEOUS
  Filled 2021-10-17: qty 0.5

## 2021-10-17 MED ORDER — LACTULOSE 10 GM/15ML PO SOLN
ORAL | 2 refills | Status: DC
  Filled 2021-10-17: qty 2700, 30d supply, fill #0

## 2021-10-17 NOTE — Patient Instructions (Signed)
Romiplostim injection ?What is this medication? ?ROMIPLOSTIM (roe mi PLOE stim) helps your body make more platelets. This medicine is used to treat low platelets caused by chronic idiopathic thrombocytopenic purpura (ITP) or a bone marrow syndrome caused by radiation sickness. ?This medicine may be used for other purposes; ask your health care provider or pharmacist if you have questions. ?COMMON BRAND NAME(S): Nplate ?What should I tell my care team before I take this medication? ?They need to know if you have any of these conditions: ?blood clots ?myelodysplastic syndrome ?an unusual or allergic reaction to romiplostim, mannitol, other medicines, foods, dyes, or preservatives ?pregnant or trying to get pregnant ?breast-feeding ?How should I use this medication? ?This medicine is injected under the skin. It is given by a health care provider in a hospital or clinic setting. ?A special MedGuide will be given to you before each treatment. Be sure to read this information carefully each time. ?Talk to your health care provider about the use of this medicine in children. While it may be prescribed for children as young as newborns for selected conditions, precautions do apply. ?Overdosage: If you think you have taken too much of this medicine contact a poison control center or emergency room at once. ?NOTE: This medicine is only for you. Do not share this medicine with others. ?What if I miss a dose? ?Keep appointments for follow-up doses. It is important not to miss your dose. Call your health care provider if you are unable to keep an appointment. ?What may interact with this medication? ?Interactions are not expected. ?This list may not describe all possible interactions. Give your health care provider a list of all the medicines, herbs, non-prescription drugs, or dietary supplements you use. Also tell them if you smoke, drink alcohol, or use illegal drugs. Some items may interact with your medicine. ?What should I  watch for while using this medication? ?Visit your health care provider for regular checks on your progress. You may need blood work done while you are taking this medicine. Your condition will be monitored carefully while you are receiving this medicine. It is important not to miss any appointments. ?What side effects may I notice from receiving this medication? ?Side effects that you should report to your doctor or health care professional as soon as possible: ?allergic reactions (skin rash, itching or hives; swelling of the face, lips, or tongue) ?bleeding (bloody or black, tarry stools; red or dark brown urine; spitting up blood or brown material that looks like coffee grounds; red spots on the skin; unusual bruising or bleeding from the eyes, gums, or nose) ?blood clot (chest pain; shortness of breath; pain, swelling, or warmth in the leg) ?stroke (changes in vision; confusion; trouble speaking or understanding; severe headaches; sudden numbness or weakness of the face, arm or leg; trouble walking; dizziness; loss of balance or coordination) ?Side effects that usually do not require medical attention (report to your doctor or health care professional if they continue or are bothersome): ?diarrhea ?dizziness ?headache ?joint pain ?muscle pain ?stomach pain ?trouble sleeping ?This list may not describe all possible side effects. Call your doctor for medical advice about side effects. You may report side effects to FDA at 1-800-FDA-1088. ?Where should I keep my medication? ?This medicine is given in a hospital or clinic. It will not be stored at home. ?NOTE: This sheet is a summary. It may not cover all possible information. If you have questions about this medicine, talk to your doctor, pharmacist, or health care provider. ??   2022 Elsevier/Gold Standard (2021-05-15 00:00:00) ? ?

## 2021-10-18 ENCOUNTER — Other Ambulatory Visit (HOSPITAL_COMMUNITY): Payer: Self-pay

## 2021-10-22 ENCOUNTER — Other Ambulatory Visit (HOSPITAL_COMMUNITY): Payer: Self-pay

## 2021-10-22 ENCOUNTER — Ambulatory Visit: Payer: Medicaid Other | Admitting: Oncology

## 2021-10-22 ENCOUNTER — Other Ambulatory Visit: Payer: Self-pay

## 2021-10-22 ENCOUNTER — Inpatient Hospital Stay: Payer: Commercial Managed Care - HMO

## 2021-10-24 ENCOUNTER — Inpatient Hospital Stay: Payer: Commercial Managed Care - HMO

## 2021-10-29 ENCOUNTER — Other Ambulatory Visit (HOSPITAL_COMMUNITY): Payer: Self-pay

## 2021-10-29 ENCOUNTER — Inpatient Hospital Stay: Payer: Commercial Managed Care - HMO

## 2021-10-29 MED ORDER — LACTULOSE 10 GM/15ML PO SOLN
ORAL | 3 refills | Status: DC
  Filled 2021-10-29: qty 300, 5d supply, fill #0

## 2021-10-29 MED ORDER — MULTIPLE VITAMINS PO TABS: 1.0000 | ORAL_TABLET | Freq: Every day | ORAL | 0 refills | Status: DC

## 2021-10-29 MED ORDER — FUROSEMIDE 40 MG PO TABS
ORAL_TABLET | ORAL | 0 refills | Status: DC
  Filled 2021-10-29: qty 30, 30d supply, fill #0

## 2021-10-29 MED ORDER — NADOLOL 20 MG PO TABS
ORAL_TABLET | ORAL | 0 refills | Status: DC
  Filled 2021-10-29: qty 30, 30d supply, fill #0

## 2021-10-29 MED ORDER — GABAPENTIN 300 MG PO CAPS
300.0000 mg | ORAL_CAPSULE | Freq: Three times a day (TID) | ORAL | 0 refills | Status: DC
  Filled 2021-10-29: qty 90, 30d supply, fill #0

## 2021-10-29 MED ORDER — PANTOPRAZOLE SODIUM 40 MG PO TBEC
40.0000 mg | DELAYED_RELEASE_TABLET | Freq: Two times a day (BID) | ORAL | 1 refills | Status: DC
  Filled 2021-10-29: qty 60, 30d supply, fill #0

## 2021-10-29 MED ORDER — FOLIC ACID 1 MG PO TABS
ORAL_TABLET | ORAL | 0 refills | Status: DC
  Filled 2021-10-29: qty 30, 30d supply, fill #0

## 2021-10-30 NOTE — Progress Notes (Signed)
Called and spoke with [facility], set up transportation for DOS 02/27, 03/01, 03/06, and 03/08.

## 2021-10-31 ENCOUNTER — Inpatient Hospital Stay: Payer: Commercial Managed Care - HMO

## 2021-10-31 ENCOUNTER — Emergency Department (HOSPITAL_COMMUNITY): Payer: Medicaid Other

## 2021-10-31 ENCOUNTER — Encounter (HOSPITAL_COMMUNITY): Payer: Self-pay | Admitting: Emergency Medicine

## 2021-10-31 ENCOUNTER — Other Ambulatory Visit: Payer: Self-pay

## 2021-10-31 ENCOUNTER — Inpatient Hospital Stay (HOSPITAL_COMMUNITY)
Admission: EM | Admit: 2021-10-31 | Discharge: 2021-11-02 | DRG: 433 | Disposition: A | Discharge status: Home / Self Care | Payer: Medicaid Other | Attending: Internal Medicine | Admitting: Internal Medicine

## 2021-10-31 DIAGNOSIS — K7031 Alcoholic cirrhosis of liver with ascites: Secondary | ICD-10-CM | POA: Diagnosis not present

## 2021-10-31 DIAGNOSIS — R161 Splenomegaly, not elsewhere classified: Secondary | ICD-10-CM | POA: Diagnosis present

## 2021-10-31 DIAGNOSIS — R109 Unspecified abdominal pain: Secondary | ICD-10-CM

## 2021-10-31 DIAGNOSIS — R1084 Generalized abdominal pain: Secondary | ICD-10-CM | POA: Diagnosis not present

## 2021-10-31 DIAGNOSIS — Z87442 Personal history of urinary calculi: Secondary | ICD-10-CM

## 2021-10-31 DIAGNOSIS — R601 Generalized edema: Secondary | ICD-10-CM | POA: Diagnosis not present

## 2021-10-31 DIAGNOSIS — Z79899 Other long term (current) drug therapy: Secondary | ICD-10-CM

## 2021-10-31 DIAGNOSIS — E876 Hypokalemia: Secondary | ICD-10-CM | POA: Diagnosis present

## 2021-10-31 DIAGNOSIS — K7460 Unspecified cirrhosis of liver: Secondary | ICD-10-CM

## 2021-10-31 DIAGNOSIS — Z888 Allergy status to other drugs, medicaments and biological substances status: Secondary | ICD-10-CM

## 2021-10-31 DIAGNOSIS — Z20822 Contact with and (suspected) exposure to covid-19: Secondary | ICD-10-CM | POA: Diagnosis present

## 2021-10-31 DIAGNOSIS — I864 Gastric varices: Secondary | ICD-10-CM | POA: Diagnosis present

## 2021-10-31 DIAGNOSIS — D6959 Other secondary thrombocytopenia: Secondary | ICD-10-CM | POA: Diagnosis present

## 2021-10-31 DIAGNOSIS — F413 Other mixed anxiety disorders: Secondary | ICD-10-CM | POA: Diagnosis present

## 2021-10-31 DIAGNOSIS — F1020 Alcohol dependence, uncomplicated: Secondary | ICD-10-CM | POA: Diagnosis not present

## 2021-10-31 DIAGNOSIS — D693 Immune thrombocytopenic purpura: Secondary | ICD-10-CM

## 2021-10-31 DIAGNOSIS — E871 Hypo-osmolality and hyponatremia: Secondary | ICD-10-CM | POA: Diagnosis present

## 2021-10-31 DIAGNOSIS — K766 Portal hypertension: Secondary | ICD-10-CM | POA: Diagnosis present

## 2021-10-31 DIAGNOSIS — D62 Acute posthemorrhagic anemia: Secondary | ICD-10-CM | POA: Diagnosis not present

## 2021-10-31 DIAGNOSIS — F10239 Alcohol dependence with withdrawal, unspecified: Secondary | ICD-10-CM | POA: Diagnosis present

## 2021-10-31 DIAGNOSIS — Z801 Family history of malignant neoplasm of trachea, bronchus and lung: Secondary | ICD-10-CM

## 2021-10-31 DIAGNOSIS — I8510 Secondary esophageal varices without bleeding: Secondary | ICD-10-CM | POA: Diagnosis present

## 2021-10-31 DIAGNOSIS — D638 Anemia in other chronic diseases classified elsewhere: Secondary | ICD-10-CM | POA: Diagnosis present

## 2021-10-31 DIAGNOSIS — D649 Anemia, unspecified: Secondary | ICD-10-CM | POA: Diagnosis present

## 2021-10-31 DIAGNOSIS — F319 Bipolar disorder, unspecified: Secondary | ICD-10-CM | POA: Diagnosis present

## 2021-10-31 DIAGNOSIS — Y900 Blood alcohol level of less than 20 mg/100 ml: Secondary | ICD-10-CM | POA: Diagnosis present

## 2021-10-31 DIAGNOSIS — Z8619 Personal history of other infectious and parasitic diseases: Secondary | ICD-10-CM

## 2021-10-31 DIAGNOSIS — G40909 Epilepsy, unspecified, not intractable, without status epilepticus: Secondary | ICD-10-CM | POA: Diagnosis present

## 2021-10-31 DIAGNOSIS — K219 Gastro-esophageal reflux disease without esophagitis: Secondary | ICD-10-CM | POA: Diagnosis present

## 2021-10-31 DIAGNOSIS — Z808 Family history of malignant neoplasm of other organs or systems: Secondary | ICD-10-CM

## 2021-10-31 LAB — COMPREHENSIVE METABOLIC PANEL
ALT: 87 U/L — ABNORMAL HIGH (ref 0–44)
AST: 87 U/L — ABNORMAL HIGH (ref 15–41)
Albumin: 2.6 g/dL — ABNORMAL LOW (ref 3.5–5.0)
Alkaline Phosphatase: 191 U/L — ABNORMAL HIGH (ref 38–126)
Anion gap: 7 (ref 5–15)
BUN: 6 mg/dL (ref 6–20)
CO2: 25 mmol/L (ref 22–32)
Calcium: 7.7 mg/dL — ABNORMAL LOW (ref 8.9–10.3)
Chloride: 99 mmol/L (ref 98–111)
Creatinine, Ser: 0.58 mg/dL (ref 0.44–1.00)
GFR, Estimated: >60 mL/min (ref >60)
Glucose, Bld: 110 mg/dL — ABNORMAL HIGH (ref 70–99)
Potassium: 3.4 mmol/L — ABNORMAL LOW (ref 3.5–5.1)
Sodium: 131 mmol/L — ABNORMAL LOW (ref 135–145)
Total Bilirubin: 6.6 mg/dL — ABNORMAL HIGH (ref 0.3–1.2)
Total Protein: 6.1 g/dL — ABNORMAL LOW (ref 6.5–8.1)

## 2021-10-31 LAB — CBC WITH DIFFERENTIAL/PLATELET
Abs Immature Granulocytes: 0.03 10*3/uL (ref 0.00–0.07)
Basophils Absolute: 0.1 10*3/uL (ref 0.0–0.1)
Basophils Relative: 2 %
Eosinophils Absolute: 0.1 10*3/uL (ref 0.0–0.5)
Eosinophils Relative: 2 %
HCT: 27.5 % — ABNORMAL LOW (ref 36.0–46.0)
Hemoglobin: 8.6 g/dL — ABNORMAL LOW (ref 12.0–15.0)
Immature Granulocytes: 1 %
Lymphocytes Relative: 17 %
Lymphs Abs: 0.7 10*3/uL (ref 0.7–4.0)
MCH: 29.1 pg (ref 26.0–34.0)
MCHC: 31.3 g/dL (ref 30.0–36.0)
MCV: 92.9 fL (ref 80.0–100.0)
Monocytes Absolute: 0.7 10*3/uL (ref 0.1–1.0)
Monocytes Relative: 17 %
Neutro Abs: 2.5 10*3/uL (ref 1.7–7.7)
Neutrophils Relative %: 61 %
Platelets: 148 10*3/uL — ABNORMAL LOW (ref 150–400)
RBC: 2.96 MIL/uL — ABNORMAL LOW (ref 3.87–5.11)
RDW: 19.3 % — ABNORMAL HIGH (ref 11.5–15.5)
WBC: 4.1 10*3/uL (ref 4.0–10.5)
nRBC: 0 % (ref 0.0–0.2)

## 2021-10-31 LAB — I-STAT CHEM 8, ED
BUN: <3 mg/dL — ABNORMAL LOW (ref 6–20)
Calcium, Ion: 1.08 mmol/L — ABNORMAL LOW (ref 1.15–1.40)
Chloride: 96 mmol/L — ABNORMAL LOW (ref 98–111)
Creatinine, Ser: 0.6 mg/dL (ref 0.44–1.00)
Glucose, Bld: 104 mg/dL — ABNORMAL HIGH (ref 70–99)
HCT: 30 % — ABNORMAL LOW (ref 36.0–46.0)
Hemoglobin: 10.2 g/dL — ABNORMAL LOW (ref 12.0–15.0)
Potassium: 3.5 mmol/L (ref 3.5–5.1)
Sodium: 135 mmol/L (ref 135–145)
TCO2: 28 mmol/L (ref 22–32)

## 2021-10-31 LAB — RESP PANEL BY RT-PCR (FLU A&B, COVID) ARPGX2
Influenza A by PCR: NEGATIVE
Influenza B by PCR: NEGATIVE
SARS Coronavirus 2 by RT PCR: NEGATIVE

## 2021-10-31 LAB — PROTIME-INR
INR: 1.3 — ABNORMAL HIGH (ref 0.8–1.2)
Prothrombin Time: 15.8 seconds — ABNORMAL HIGH (ref 11.4–15.2)

## 2021-10-31 LAB — LIPASE, BLOOD: Lipase: 288 U/L — ABNORMAL HIGH (ref 11–51)

## 2021-10-31 LAB — TYPE AND SCREEN
ABO/RH(D): O POS
Antibody Screen: NEGATIVE

## 2021-10-31 LAB — ETHANOL: Alcohol, Ethyl (B): 11 mg/dL — ABNORMAL HIGH (ref <10)

## 2021-10-31 LAB — I-STAT BETA HCG BLOOD, ED (MC, WL, AP ONLY): I-stat hCG, quantitative: <5 m[IU]/mL (ref <5)

## 2021-10-31 LAB — AMMONIA: Ammonia: 19 umol/L (ref 9–35)

## 2021-10-31 MED ORDER — SODIUM CHLORIDE 0.9 % IV BOLUS: 1000.0000 mL | Freq: Once | INTRAVENOUS | Status: DC

## 2021-10-31 MED ORDER — IOHEXOL 300 MG/ML  SOLN
100.0000 mL | Freq: Once | INTRAMUSCULAR | Status: AC | PRN
  Administered 2021-10-31: 100 mL via INTRAVENOUS

## 2021-10-31 MED ORDER — SODIUM CHLORIDE (PF) 0.9 % IJ SOLN
INTRAMUSCULAR | Status: AC
  Administered 2021-11-01: 10 mL
  Filled 2021-10-31: qty 50

## 2021-10-31 MED ORDER — MORPHINE SULFATE (PF) 4 MG/ML IV SOLN
4.0000 mg | Freq: Once | INTRAVENOUS | Status: AC
  Administered 2021-10-31: 4 mg via INTRAVENOUS
  Filled 2021-10-31: qty 1

## 2021-10-31 NOTE — ED Triage Notes (Signed)
Patient BIB [facility] for evaluation of abdominal pain.  Hx of liver cirrhosis.  Was recently at [facility] for GI bleed with esophageal varicosis.  Patient states she had to receive "7 units of blood."  Having increased abdominal pain and swelling.  No reports of fevers.

## 2021-10-31 NOTE — ED Notes (Signed)
Portable xray at bedside.

## 2021-10-31 NOTE — ED Provider Notes (Signed)
[place] DEPT Provider Note   CSN: [id number] Arrival date & time: 10/31/21  2101     History  Chief Complaint  Patient presents with   Abdominal Pain    Briana Sweeney is a 51 y.o. female hx of cirrhosis, esophageal variceal bleeding, here presenting with abdominal pain.  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  Patient was just admitted to [facility] after being transferred from [facility] for esophageal variceal bleeding.  Patient required several units of blood.  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  Patient was discharged home 2 days ago.  She states that since then, she has noted worsening abdominal distention.  She states that now she has shortness of breath as well.  Patient states that she has not been drinking alcohol since her last admission.   The history is provided by the patient.      Home Medications Prior to Admission medications   Medication Sig Start Date End Date Taking? Authorizing Provider  amoxicillin-clavulanate (AUGMENTIN) 500-125 MG tablet Take 1 tablet by mouth 3 times daily 09/27/21     ARIPiprazole (ABILIFY) 10 MG tablet Take 1 tablet (10 mg total) by mouth daily. 06/01/21     ARIPiprazole (ABILIFY) 10 MG tablet Take 1 tablet by mouth daily. 08/30/21     clonazePAM (KLONOPIN) 0.5 MG tablet Take 1 tablet by mouth 2 times daily. 08/30/21     diclofenac Sodium (VOLTAREN) 1 % GEL Apply 4 g topically 4 (four) times daily. 05/26/20   [name], FNP  FLUoxetine (PROZAC) 10 MG capsule Take 1 capsule by mouth daily. 08/30/21     FLUoxetine (PROZAC) 40 MG capsule Take 1 capsule (40 mg total) by mouth daily. 02/14/10     folic acid (FOLVITE) 1 MG tablet Take 1 tablet (1 mg total) by mouth once daily 10/29/21     furosemide (LASIX) 40 MG tablet Take 1 tablet (40 mg total) by mouth daily. 06/30/21   Alcus Dad, MD  furosemide (LASIX) 40 MG tablet Take 1 tablet (40 mg total) by mouth once daily 10/29/21      gabapentin (NEURONTIN) 300 MG capsule TAKE 2 CAPSULES BY MOUTH 3 TIMES DAILY.  NEED OFFICE VISIT FOR FURTHER REFILLS 04/16/21 04/16/22  Tresa Garter, MD  gabapentin (NEURONTIN) 300 MG capsule Take 1 capsule (300 mg total) by mouth 3 (three) times daily. 10/29/21     Lactobacillus Rhamnosus, GG, (CULTURELLE) CAPS Take 1 capsule by mouth daily 09/27/21     lactulose (CHRONULAC) 10 GM/15ML solution Take 30 mLs (20 g total) by mouth 3 (three) times daily. 04/08/21   Raiford Noble Latif, DO  lactulose (CHRONULAC) 10 GM/15ML solution Take 30 MLs by mouth 3 times per day 10/17/21     lactulose (CHRONULAC) 10 GM/15ML solution Take 30 mLs by mouth 2 (two) times daily as needed for Constipation (take as needed to have goal 2-3 bowel movements daily) for up to 30 days 10/29/21     levETIRAcetam (KEPPRA) 750 MG tablet Take 1 tablet (750 mg total) by mouth 2 (two) times daily. 07/06/21   Cameron Sprang, MD  Multiple Vitamins tablet Take 1 tablet by mouth once daily 10/29/21     Multiple Vitamins-Minerals (MULTIVITAMIN ADULT, MINERALS,) TABS Take 1 tablet by mouth daily 09/27/21     nadolol (CORGARD) 20 MG tablet Take 1 tablet (20 mg total) by mouth once daily 10/29/21     naltrexone (DEPADE) 50 MG tablet Take 1 tablet (50  mg total) by mouth daily. 06/29/21   Alcus Dad, MD  pantoprazole (PROTONIX) 40 MG tablet Take 1 tablet (40 mg total) by mouth daily. 06/29/21   Alcus Dad, MD  pantoprazole (PROTONIX) 40 MG tablet Take 1 tablet (40 mg total) by mouth 2 (two) times daily before meals 10/29/21     spironolactone (ALDACTONE) 100 MG tablet Take 1 tablet (100 mg total) by mouth daily. 06/30/21   Alcus Dad, MD  thiamine (VITAMIN B-1) 100 MG tablet Take 1 tablet by mouth daily 09/27/21         Allergies    Other    Review of Systems   Review of Systems  Respiratory:  Positive for shortness of breath.   Gastrointestinal:  Positive for abdominal pain.  All other systems reviewed and are  negative.  Physical Exam Updated Vital Signs BP 120/81 (BP Location: Right Arm)    Pulse 94    Temp 98.1 F (36.7 C)    Resp 18    Ht 4\' 11"  (1.499 m)    Wt 57.6 kg    SpO2 97%    BMI 25.65 kg/m  Physical Exam Vitals and nursing note reviewed.  Constitutional:      Comments: Chronically ill, jaundiced  HENT:     Head: Normocephalic.     Mouth/Throat:     Mouth: Mucous membranes are moist.  Eyes:     General: Scleral icterus present.     Extraocular Movements: Extraocular movements intact.     Comments: Scleral icterus  Cardiovascular:     Rate and Rhythm: Normal rate and regular rhythm.  Abdominal:     Comments: Abdomen is distended and there is fluid wave.  There appears to be tense ascites as well.  Skin:    General: Skin is warm.     Capillary Refill: Capillary refill takes less than 2 seconds.  Neurological:     General: No focal deficit present.     Mental Status: She is oriented to person, place, and time.  Psychiatric:        Mood and Affect: Mood normal.        Behavior: Behavior normal.    ED Results / Procedures / Treatments   Labs (all labs ordered are listed, but only abnormal results are displayed) Labs Reviewed  CBC WITH DIFFERENTIAL/PLATELET - Abnormal; Notable for the following components:      Result Value   RBC 2.96 (*)    Hemoglobin 8.6 (*)    HCT 27.5 (*)    RDW 19.3 (*)    Platelets 148 (*)    All other components within normal limits  ETHANOL - Abnormal; Notable for the following components:   Alcohol, Ethyl (B) 11 (*)    All other components within normal limits  PROTIME-INR - Abnormal; Notable for the following components:   Prothrombin Time 15.8 (*)    INR 1.3 (*)    All other components within normal limits  I-STAT CHEM 8, ED - Abnormal; Notable for the following components:   Chloride 96 (*)    BUN <3 (*)    Glucose, Bld 104 (*)    Calcium, Ion 1.08 (*)    Hemoglobin 10.2 (*)    HCT 30.0 (*)    All other components within normal  limits  RESP PANEL BY RT-PCR (FLU A&B, COVID) ARPGX2  AMMONIA  COMPREHENSIVE METABOLIC PANEL  LIPASE, BLOOD  I-STAT BETA HCG BLOOD, ED ([facility], [facility], [facility] ONLY)  TYPE AND SCREEN  EKG None  Radiology CT ABDOMEN PELVIS W CONTRAST  Result Date: 10/31/2021 CLINICAL DATA:  Abdominal pain, known cirrhosis EXAM: CT ABDOMEN AND PELVIS WITH CONTRAST TECHNIQUE: Multidetector CT imaging of the abdomen and pelvis was performed using the standard protocol following bolus administration of intravenous contrast. RADIATION DOSE REDUCTION: This exam was performed according to the departmental dose-optimization program which includes automated exposure control, adjustment of the mA and/or kV according to patient size and/or use of iterative reconstruction technique. CONTRAST:  133mL OMNIPAQUE IOHEXOL 300 MG/ML  SOLN COMPARISON:  09/22/2021 FINDINGS: Lower chest: Calcified left infrahilar lymph nodes are seen. Multiple esophageal varices are identified consistent with the patient's given clinical history. Mild atelectatic changes are noted in the bases. Hepatobiliary: Liver is small and nodular consistent with the given clinical history of cirrhosis. Cholelithiasis is noted without complicating factors. Pancreas: Pancreas is well visualized with some scattered pancreatic calcifications in the head of the pancreas. Spleen: Splenic granulomas are noted. Spleen is mildly enlarged consistent with the given clinical history of portal hypertension. Adrenals/Urinary Tract: Adrenal glands are within normal limits. Kidneys demonstrate a normal enhancement pattern bilaterally. No renal calculi or obstructive changes are seen. Normal excretion of contrast is noted. The bladder is well distended. Stomach/Bowel: The appendix is within normal limits. No obstructive or inflammatory changes of colon are seen. Small bowel and stomach appear within normal limits. Multiple esophageal varices are seen. Vascular/Lymphatic: No significant vascular  findings are present. No enlarged abdominal or pelvic lymph nodes. Diffuse pelvic varices are seen. Esophageal varices are noted as well. A large coronary vein is noted extending to the esophageal varices. Some mild perigastric varices are noted as well. Reproductive: Uterus and bilateral adnexa are unremarkable. Other: Mild to moderate ascites is noted consistent with the given clinical history of portal hypertension. Musculoskeletal: Degenerative changes of lumbar spine are noted. IMPRESSION: Changes consistent with cirrhosis of the liver with portal hypertension and splenomegaly as well as gastric and esophageal varices. Ascites is noted as well. Changes of prior granulomatous disease. Cholelithiasis without complicating factors. Diffuse pelvic varices. Electronically Signed   By: Inez Catalina M.D.   On: 10/31/2021 22:49   DG Chest Port 1 View  Result Date: 10/31/2021 CLINICAL DATA:  Shortness of breath EXAM: PORTABLE CHEST 1 VIEW COMPARISON:  10/21/2021 FINDINGS: Cardiac shadow is stable. Calcified hilar lymph nodes are noted. No focal infiltrate or effusion is seen. Bony structures are within normal limits. IMPRESSION: Acute abnormality noted. Electronically Signed   By: Inez Catalina M.D.   On: 10/31/2021 21:48    Procedures Procedures   Angiocath insertion Performed by: Wandra Arthurs  Consent: Verbal consent obtained. Risks and benefits: risks, benefits and alternatives were discussed Time out: Immediately prior to procedure a "time out" was called to verify the correct patient, procedure, equipment, support staff and site/side marked as required.  Preparation: Patient was prepped and draped in the usual sterile fashion.  Vein Location: R antecube  Ultrasound Guided  Gauge: 20 long   Normal blood return and flush without difficulty Patient tolerance: Patient tolerated the procedure well with no immediate complications.    Medications Ordered in ED Medications  sodium chloride (PF)  0.9 % injection (has no administration in time range)  morphine (PF) 4 MG/ML injection 4 mg (4 mg Intravenous Given 10/31/21 2245)  iohexol (OMNIPAQUE) 300 MG/ML solution 100 mL (100 mLs Intravenous Contrast Given 10/31/21 2221)    ED Course/ Medical Decision Making/ A&P  Medical Decision Making Asja Peniel Biel is a 51 y.o. 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  Patient was just admitted to the hospital for esophageal variceal bleeding.  Patient had no further bleeding but states that her abdomen is very distended and she is concerned that she may have ascites again.  Patient does not require paracentesis that often.  She states that her last paracentesis was a year ago.  She states that she usually need admission for monitoring as her ascites usually comes back right away.  She also has some shortness of breath as well.  I have low suspicion for SBP currently.  I think likely her pain is from her cirrhosis.  We will get CBC and CMP and CT abdomen pelvis  11:07 PM Labs showed alcohol level of 11.  CT showed cirrhosis and portal hypertension and splenomegaly.  Patient has significant ascites.  I offered her to get paracentesis at bedside and discharged home but she states that she usually requires admission for monitoring.  At this point, will have IR do paracentesis in the morning.   Problems Addressed: Alcoholic cirrhosis of liver with ascites ([place]): acute illness or injury  Amount and/or Complexity of Data Reviewed Labs: ordered. Decision-making details documented in ED Course. Radiology: ordered and independent interpretation performed. Decision-making details documented in ED Course. ECG/medicine tests: ordered and independent interpretation performed. Decision-making details documented in ED Course.  Risk Prescription drug management. Decision regarding hospitalization.  Final Clinical Impression(s) / ED Diagnoses Final  diagnoses:  None    Rx / DC Orders ED Discharge Orders     None         Drenda Freeze, MD 10/31/21 2318

## 2021-10-31 NOTE — ED Notes (Signed)
Patient reports improvement in pain at this time

## 2021-11-01 ENCOUNTER — Observation Stay (HOSPITAL_COMMUNITY): Payer: Medicaid Other

## 2021-11-01 DIAGNOSIS — F10239 Alcohol dependence with withdrawal, unspecified: Secondary | ICD-10-CM | POA: Diagnosis present

## 2021-11-01 DIAGNOSIS — E876 Hypokalemia: Secondary | ICD-10-CM | POA: Diagnosis present

## 2021-11-01 DIAGNOSIS — Z20822 Contact with and (suspected) exposure to covid-19: Secondary | ICD-10-CM | POA: Diagnosis present

## 2021-11-01 DIAGNOSIS — E871 Hypo-osmolality and hyponatremia: Secondary | ICD-10-CM | POA: Diagnosis present

## 2021-11-01 DIAGNOSIS — Z8619 Personal history of other infectious and parasitic diseases: Secondary | ICD-10-CM | POA: Diagnosis not present

## 2021-11-01 DIAGNOSIS — K219 Gastro-esophageal reflux disease without esophagitis: Secondary | ICD-10-CM | POA: Diagnosis present

## 2021-11-01 DIAGNOSIS — K7031 Alcoholic cirrhosis of liver with ascites: Secondary | ICD-10-CM | POA: Diagnosis present

## 2021-11-01 DIAGNOSIS — D62 Acute posthemorrhagic anemia: Secondary | ICD-10-CM | POA: Insufficient documentation

## 2021-11-01 DIAGNOSIS — D6959 Other secondary thrombocytopenia: Secondary | ICD-10-CM | POA: Diagnosis present

## 2021-11-01 DIAGNOSIS — Z888 Allergy status to other drugs, medicaments and biological substances status: Secondary | ICD-10-CM | POA: Diagnosis not present

## 2021-11-01 DIAGNOSIS — G40909 Epilepsy, unspecified, not intractable, without status epilepticus: Secondary | ICD-10-CM | POA: Diagnosis present

## 2021-11-01 DIAGNOSIS — I864 Gastric varices: Secondary | ICD-10-CM | POA: Diagnosis present

## 2021-11-01 DIAGNOSIS — K766 Portal hypertension: Secondary | ICD-10-CM | POA: Diagnosis present

## 2021-11-01 DIAGNOSIS — F319 Bipolar disorder, unspecified: Secondary | ICD-10-CM | POA: Diagnosis present

## 2021-11-01 DIAGNOSIS — Z801 Family history of malignant neoplasm of trachea, bronchus and lung: Secondary | ICD-10-CM | POA: Diagnosis not present

## 2021-11-01 DIAGNOSIS — Z79899 Other long term (current) drug therapy: Secondary | ICD-10-CM | POA: Diagnosis not present

## 2021-11-01 DIAGNOSIS — R601 Generalized edema: Secondary | ICD-10-CM | POA: Diagnosis present

## 2021-11-01 DIAGNOSIS — I8510 Secondary esophageal varices without bleeding: Secondary | ICD-10-CM | POA: Diagnosis present

## 2021-11-01 DIAGNOSIS — Z87442 Personal history of urinary calculi: Secondary | ICD-10-CM | POA: Diagnosis not present

## 2021-11-01 DIAGNOSIS — D693 Immune thrombocytopenic purpura: Secondary | ICD-10-CM | POA: Diagnosis present

## 2021-11-01 DIAGNOSIS — D638 Anemia in other chronic diseases classified elsewhere: Secondary | ICD-10-CM | POA: Diagnosis present

## 2021-11-01 DIAGNOSIS — F413 Other mixed anxiety disorders: Secondary | ICD-10-CM | POA: Diagnosis present

## 2021-11-01 DIAGNOSIS — Y900 Blood alcohol level of less than 20 mg/100 ml: Secondary | ICD-10-CM | POA: Diagnosis present

## 2021-11-01 DIAGNOSIS — R1084 Generalized abdominal pain: Secondary | ICD-10-CM | POA: Diagnosis not present

## 2021-11-01 DIAGNOSIS — R161 Splenomegaly, not elsewhere classified: Secondary | ICD-10-CM | POA: Diagnosis present

## 2021-11-01 DIAGNOSIS — Z808 Family history of malignant neoplasm of other organs or systems: Secondary | ICD-10-CM | POA: Diagnosis not present

## 2021-11-01 LAB — BODY FLUID CELL COUNT WITH DIFFERENTIAL
Lymphs, Fluid: 13 %
Monocyte-Macrophage-Serous Fluid: 86 % (ref 50–90)
Neutrophil Count, Fluid: 1 % (ref 0–25)
Total Nucleated Cell Count, Fluid: 114 cu mm (ref 0–1000)

## 2021-11-01 LAB — CBC
HCT: 24.5 % — ABNORMAL LOW (ref 36.0–46.0)
Hemoglobin: 7.9 g/dL — ABNORMAL LOW (ref 12.0–15.0)
MCH: 29.7 pg (ref 26.0–34.0)
MCHC: 32.2 g/dL (ref 30.0–36.0)
MCV: 92.1 fL (ref 80.0–100.0)
Platelets: 116 10*3/uL — ABNORMAL LOW (ref 150–400)
RBC: 2.66 MIL/uL — ABNORMAL LOW (ref 3.87–5.11)
RDW: 19.4 % — ABNORMAL HIGH (ref 11.5–15.5)
WBC: 3.9 10*3/uL — ABNORMAL LOW (ref 4.0–10.5)
nRBC: 0 % (ref 0.0–0.2)

## 2021-11-01 LAB — COMPREHENSIVE METABOLIC PANEL
ALT: 75 U/L — ABNORMAL HIGH (ref 0–44)
AST: 72 U/L — ABNORMAL HIGH (ref 15–41)
Albumin: 2.2 g/dL — ABNORMAL LOW (ref 3.5–5.0)
Alkaline Phosphatase: 163 U/L — ABNORMAL HIGH (ref 38–126)
Anion gap: 7 (ref 5–15)
BUN: 6 mg/dL (ref 6–20)
CO2: 25 mmol/L (ref 22–32)
Calcium: 7.6 mg/dL — ABNORMAL LOW (ref 8.9–10.3)
Chloride: 100 mmol/L (ref 98–111)
Creatinine, Ser: 0.56 mg/dL (ref 0.44–1.00)
GFR, Estimated: >60 mL/min (ref >60)
Glucose, Bld: 100 mg/dL — ABNORMAL HIGH (ref 70–99)
Potassium: 3.1 mmol/L — ABNORMAL LOW (ref 3.5–5.1)
Sodium: 132 mmol/L — ABNORMAL LOW (ref 135–145)
Total Bilirubin: 5.4 mg/dL — ABNORMAL HIGH (ref 0.3–1.2)
Total Protein: 5.2 g/dL — ABNORMAL LOW (ref 6.5–8.1)

## 2021-11-01 LAB — MAGNESIUM: Magnesium: 1.5 mg/dL — ABNORMAL LOW (ref 1.7–2.4)

## 2021-11-01 LAB — PROTIME-INR
INR: 1.4 — ABNORMAL HIGH (ref 0.8–1.2)
Prothrombin Time: 16.7 seconds — ABNORMAL HIGH (ref 11.4–15.2)

## 2021-11-01 MED ORDER — FUROSEMIDE 10 MG/ML IJ SOLN
40.0000 mg | Freq: Two times a day (BID) | INTRAMUSCULAR | Status: DC
  Administered 2021-11-01 – 2021-11-02 (×2): 40 mg via INTRAVENOUS
  Filled 2021-11-01 (×2): qty 4

## 2021-11-01 MED ORDER — LEVETIRACETAM 500 MG PO TABS
750.0000 mg | ORAL_TABLET | Freq: Two times a day (BID) | ORAL | Status: DC
  Administered 2021-11-01 – 2021-11-02 (×3): 750 mg via ORAL
  Filled 2021-11-01 (×4): qty 1

## 2021-11-01 MED ORDER — ALBUMIN HUMAN 25 % IV SOLN
50.0000 g | Freq: Once | INTRAVENOUS | Status: DC
Start: 2021-11-02 — End: 2021-11-01

## 2021-11-01 MED ORDER — FOLIC ACID 1 MG PO TABS
1.0000 mg | ORAL_TABLET | Freq: Every day | ORAL | Status: DC
  Administered 2021-11-01 – 2021-11-02 (×2): 1 mg via ORAL
  Filled 2021-11-01 (×3): qty 1

## 2021-11-01 MED ORDER — THIAMINE HCL 100 MG PO TABS
100.0000 mg | ORAL_TABLET | Freq: Every day | ORAL | Status: DC
  Administered 2021-11-01 – 2021-11-02 (×2): 100 mg via ORAL
  Filled 2021-11-01 (×2): qty 1

## 2021-11-01 MED ORDER — MAGNESIUM OXIDE -MG SUPPLEMENT 400 (240 MG) MG PO TABS
400.0000 mg | ORAL_TABLET | Freq: Two times a day (BID) | ORAL | Status: DC
  Administered 2021-11-01 – 2021-11-02 (×3): 400 mg via ORAL
  Filled 2021-11-01 (×3): qty 1

## 2021-11-01 MED ORDER — PANTOPRAZOLE SODIUM 40 MG PO TBEC
40.0000 mg | DELAYED_RELEASE_TABLET | Freq: Two times a day (BID) | ORAL | Status: DC
  Administered 2021-11-01 – 2021-11-02 (×4): 40 mg via ORAL
  Filled 2021-11-01 (×4): qty 1

## 2021-11-01 MED ORDER — LORAZEPAM 1 MG PO TABS
0.0000 mg | ORAL_TABLET | Freq: Four times a day (QID) | ORAL | Status: DC
  Administered 2021-11-01 – 2021-11-02 (×5): 1 mg via ORAL
  Filled 2021-11-01 (×5): qty 1

## 2021-11-01 MED ORDER — FENTANYL CITRATE PF 50 MCG/ML IJ SOSY
12.5000 ug | PREFILLED_SYRINGE | INTRAMUSCULAR | Status: DC | PRN
  Administered 2021-11-01 (×2): 25 ug via INTRAVENOUS
  Administered 2021-11-01: 12.5 ug via INTRAVENOUS
  Administered 2021-11-02 (×2): 25 ug via INTRAVENOUS
  Filled 2021-11-01 (×5): qty 1

## 2021-11-01 MED ORDER — CLONAZEPAM 0.5 MG PO TABS
0.5000 mg | ORAL_TABLET | Freq: Two times a day (BID) | ORAL | Status: DC
  Administered 2021-11-01 – 2021-11-02 (×4): 0.5 mg via ORAL
  Filled 2021-11-01 (×4): qty 1

## 2021-11-01 MED ORDER — LORAZEPAM 1 MG PO TABS: 0.0000 mg | ORAL_TABLET | Freq: Two times a day (BID) | ORAL | Status: DC

## 2021-11-01 MED ORDER — MAGNESIUM SULFATE 2 GM/50ML IV SOLN
2.0000 g | Freq: Once | INTRAVENOUS | Status: AC
  Administered 2021-11-01: 2 g via INTRAVENOUS
  Filled 2021-11-01: qty 50

## 2021-11-01 MED ORDER — NADOLOL 20 MG PO TABS
20.0000 mg | ORAL_TABLET | Freq: Every day | ORAL | Status: DC
  Administered 2021-11-01 – 2021-11-02 (×2): 20 mg via ORAL
  Filled 2021-11-01 (×2): qty 1

## 2021-11-01 MED ORDER — ONDANSETRON HCL 4 MG PO TABS: 4.0000 mg | ORAL_TABLET | Freq: Four times a day (QID) | ORAL | Status: DC | PRN

## 2021-11-01 MED ORDER — LIDOCAINE HCL 1 % IJ SOLN
INTRAMUSCULAR | Status: AC
  Administered 2021-11-01: 15 mL
  Filled 2021-11-01: qty 20

## 2021-11-01 MED ORDER — LACTULOSE 10 GM/15ML PO SOLN
20.0000 g | Freq: Three times a day (TID) | ORAL | Status: DC
  Administered 2021-11-01 – 2021-11-02 (×4): 20 g via ORAL
  Filled 2021-11-01 (×4): qty 30

## 2021-11-01 MED ORDER — SPIRONOLACTONE 100 MG PO TABS
100.0000 mg | ORAL_TABLET | Freq: Every day | ORAL | Status: DC
  Administered 2021-11-01 – 2021-11-02 (×2): 100 mg via ORAL
  Filled 2021-11-01 (×2): qty 1

## 2021-11-01 MED ORDER — FLUOXETINE HCL 20 MG PO CAPS
40.0000 mg | ORAL_CAPSULE | Freq: Every day | ORAL | Status: DC
  Administered 2021-11-01 – 2021-11-02 (×2): 40 mg via ORAL
  Filled 2021-11-01 (×2): qty 2

## 2021-11-01 MED ORDER — POTASSIUM CHLORIDE CRYS ER 20 MEQ PO TBCR
20.0000 meq | EXTENDED_RELEASE_TABLET | Freq: Every day | ORAL | Status: DC
  Administered 2021-11-01 – 2021-11-02 (×2): 20 meq via ORAL
  Filled 2021-11-01 (×2): qty 1

## 2021-11-01 MED ORDER — ADULT MULTIVITAMIN W/MINERALS CH
1.0000 | ORAL_TABLET | Freq: Every day | ORAL | Status: DC
  Administered 2021-11-01 – 2021-11-02 (×2): 1 via ORAL
  Filled 2021-11-01 (×3): qty 1

## 2021-11-01 MED ORDER — GABAPENTIN 300 MG PO CAPS
300.0000 mg | ORAL_CAPSULE | Freq: Three times a day (TID) | ORAL | Status: DC
  Administered 2021-11-01 – 2021-11-02 (×4): 300 mg via ORAL
  Filled 2021-11-01 (×4): qty 1

## 2021-11-01 MED ORDER — LORAZEPAM 2 MG/ML IJ SOLN: 1.0000 mg | INTRAMUSCULAR | Status: DC | PRN

## 2021-11-01 MED ORDER — THIAMINE HCL 100 MG/ML IJ SOLN: 100.0000 mg | Freq: Every day | INTRAMUSCULAR | Status: DC

## 2021-11-01 MED ORDER — POTASSIUM CHLORIDE CRYS ER 20 MEQ PO TBCR
40.0000 meq | EXTENDED_RELEASE_TABLET | Freq: Once | ORAL | Status: AC
  Administered 2021-11-01: 40 meq via ORAL
  Filled 2021-11-01: qty 2

## 2021-11-01 MED ORDER — FUROSEMIDE 40 MG PO TABS
40.0000 mg | ORAL_TABLET | Freq: Every day | ORAL | Status: DC
  Administered 2021-11-01: 40 mg via ORAL
  Filled 2021-11-01: qty 1

## 2021-11-01 MED ORDER — ONDANSETRON HCL 4 MG/2ML IJ SOLN: 4.0000 mg | Freq: Four times a day (QID) | INTRAMUSCULAR | Status: DC | PRN

## 2021-11-01 MED ORDER — LORAZEPAM 1 MG PO TABS: 1.0000 mg | ORAL_TABLET | ORAL | Status: DC | PRN

## 2021-11-01 MED ORDER — ALBUMIN HUMAN 25 % IV SOLN
50.0000 g | Freq: Once | INTRAVENOUS | Status: AC
  Administered 2021-11-01: 50 g via INTRAVENOUS
  Filled 2021-11-01: qty 200

## 2021-11-01 NOTE — Assessment & Plan Note (Addendum)
Mild, In the setting of anasarca.on lasix

## 2021-11-01 NOTE — Assessment & Plan Note (Addendum)
Anemia of chronic disease and also with recent variceal bleeding where she needed multiple transfusion supports. Hb stable in 8s. Recent Labs  Lab 10/31/21 2159 10/31/21 2213 11/01/21 0316 11/02/21 0307  HGB 8.6* 10.2* 7.9* 8.7*  HCT 27.5* 30.0* 24.5* 28.0*

## 2021-11-01 NOTE — Assessment & Plan Note (Addendum)
In the setting of liver cirrhosis.  Continue nadolol

## 2021-11-01 NOTE — H&P (Signed)
History and Physical    Briana Sweeney VHQ:[id number] DOB: March 22, 1971 DOA: 10/31/2021  PCP: System, Provider Not In   Patient coming from: Home   Chief Complaint: Abdominal pain and swelling   HPI: Briana Sweeney is a 51 y.o. female with medical history significant for severe alcohol dependence, hepatitis C with sustained virologic response, decompensated cirrhosis, and seizure disorder, now presenting to the emergency department with abdominal pain and increased abdominal distention.  Patient was admitted to [facility] on 10/22/2021 with hemorrhagic shock secondary to bleeding esophageal varix, was fluid resuscitated, started on vasopressors, transfused with 6 unit RBC, 3 units platelets, 4 units FFP, 1 of cryo, and had emergent EGD with banding.  There was no further bleeding and she was discharged from hospital on 10/29/2021.  She reports developing increased abdominal distention and bilateral leg swelling during the hospitalization, states that she was planned to undergo paracentesis, but was eager to get back, and insisted on being discharged prior to that.  Back at home, she has not had any melena, hematochezia, or hematemesis, but continues to have abdominal distention with pain, particularly around the bilateral flanks.  She denies fevers or chills.  ED Course: Upon arrival to the ED, patient is found to be afebrile, saturating well on room air, and with stable blood pressure.  Chemistry panel notable for elevated LFTs, slightly improved from time of recent discharge.  CBC notable for hemoglobin of 8.6 and platelets 148,000, stable from recent discharge.  Ammonia level is normal and ethanol is 11.  INR was 1.3.  Patient was given IV morphine in the ED and hospitalists consulted for admission.  Review of Systems:  All other systems reviewed and apart from HPI, are negative.  Past Medical History:  Diagnosis Date   Abscess of bursa of left elbow 09/23/2018   Acute  hyperactive alcohol withdrawal delirium ([place]) 10/09/2017   Acute low back pain with bilateral sciatica    Acute metabolic encephalopathy 41/32/4401   Acute on chronic pancreatitis ([place]) 04/04/2021   AKI (acute kidney injury) ([place]) 04/04/2021   Alcohol withdrawal syndrome with complication ([place]) 02/72/5366   Alcohol-induced acute pancreatitis    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[place]) 12/05/2018   Ascites due to alcoholic cirrhosis ([place])    Bipolar affective disorder ([place])    With anxiety features   Bunionette of right foot 11/2019   Cirrhosis of liver ([place])    Due to alcohol and hepatitis C   Cocaine dependence without complication ([place]) 44/11/4740   Decompensated hepatic cirrhosis ([place]) 07/23/2017   Depression with anxiety    Epistaxis 01/20/2019   Erysipelas 09/13/2018   Esophageal 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 ([place])    Esophageal varices without bleeding (HCC)    ETOHism (HCC)    GERD (gastroesophageal reflux disease)    Head injury    Hematemesis 02/10/2018   Hepatitis C 2018   hepatitis c and alcohol related hepatitis   Heroin abuse ([place])    History of blood transfusion    "blood doesn't clot; I fell down and had to have a transfusion"   History of kidney stones    Menopause 2016   Migraine    "when I get really stressed" (09/01/2017)   Neuropathy 10/27/2018   Olecranon bursitis of left elbow    Other mixed anxiety disorders 10/27/2018   Overdose 08/22/2018   Pancytopenia ([place]) 07/23/2017   Polysubstance abuse ([place]) 04/08/2018   Portal hypertension ([place]) 03/18/2018   S/P foot surgery, right 11/2019   Schizophrenia ([place])  Seizures ([place])    "when I run out of my RX; lots recently" (09/01/2017)   Suicidal ideation    Thrombocytopenia ([place])    Trichimoniasis 10/30/2017   Upper GI bleed 02/10/2018   Urinary urgency 05/2020   UTI (urinary tract infection) 06/02/2017    Past Surgical History:  Procedure Laterality Date   BUNIONECTOMY     ESOPHAGEAL BANDING  06/26/2021    Procedure: ESOPHAGEAL BANDING;  Surgeon: Jackquline Denmark, MD;  Location: [facility] ENDOSCOPY;  Service: Endoscopy;;   ESOPHAGOGASTRODUODENOSCOPY N/A 09/03/2017   Procedure: ESOPHAGOGASTRODUODENOSCOPY (EGD);  Surgeon: Doran Stabler, MD;  Location: [facility];  Service: Gastroenterology;  Laterality: N/A;   ESOPHAGOGASTRODUODENOSCOPY (EGD) WITH PROPOFOL N/A 06/26/2021   Procedure: ESOPHAGOGASTRODUODENOSCOPY (EGD) WITH PROPOFOL;  Surgeon: Jackquline Denmark, MD;  Location: [facility] ENDOSCOPY;  Service: Endoscopy;  Laterality: N/A;   FINGER FRACTURE SURGERY Left    "shattered my pinky"   FRACTURE SURGERY     I & D EXTREMITY Left 09/18/2018   Procedure: IRRIGATION AND DEBRIDEMENT EXTREMITY;  Surgeon: Leanora Cover, MD;  Location: [facility] ORS;  Service: Orthopedics;  Laterality: Left;   IR PARACENTESIS  07/23/2017   IR PARACENTESIS  07/2017   "did it twice in the same week" (09/01/2017)   IR PARACENTESIS  06/26/2021   SHOULDER OPEN ROTATOR CUFF REPAIR Right    TOOTH EXTRACTION  08/2019   TUBAL LIGATION     VAGINAL HYSTERECTOMY      Social History:   reports that she has never smoked. She has never used smokeless tobacco. She reports that she does not currently use alcohol after a past usage of about 6.0 standard drinks per week. She reports that she does not currently use drugs.  Allergies  Allergen Reactions   Other Other (See Comments)    Platelets: Rx chest pain, tremors, body aches    Family History  Problem Relation Age of Onset   Lung cancer Mother 57   Alcohol abuse Mother    Throat cancer Father 36     Prior to Admission medications   Medication Sig Start Date End Date Taking? Authorizing Provider  amoxicillin-clavulanate (AUGMENTIN) 500-125 MG tablet Take 1 tablet by mouth 3 times daily 09/27/21     ARIPiprazole (ABILIFY) 10 MG tablet Take 1 tablet (10 mg total) by mouth daily. 06/01/21     ARIPiprazole (ABILIFY) 10 MG tablet Take 1 tablet by mouth daily. 08/30/21     clonazePAM (KLONOPIN) 0.5  MG tablet Take 1 tablet by mouth 2 times daily. 08/30/21     diclofenac Sodium (VOLTAREN) 1 % GEL Apply 4 g topically 4 (four) times daily. 05/26/20   [name], FNP  FLUoxetine (PROZAC) 10 MG capsule Take 1 capsule by mouth daily. 08/30/21     FLUoxetine (PROZAC) 40 MG capsule Take 1 capsule (40 mg total) by mouth daily. 10/16/49     folic acid (FOLVITE) 1 MG tablet Take 1 tablet (1 mg total) by mouth once daily 10/29/21     furosemide (LASIX) 40 MG tablet Take 1 tablet (40 mg total) by mouth daily. 06/30/21   Alcus Dad, MD  furosemide (LASIX) 40 MG tablet Take 1 tablet (40 mg total) by mouth once daily 10/29/21     gabapentin (NEURONTIN) 300 MG capsule TAKE 2 CAPSULES BY MOUTH 3 TIMES DAILY.  **NEED OFFICE VISIT FOR FURTHER REFILLS 04/16/21 04/16/22  Tresa Garter, MD  gabapentin (NEURONTIN) 300 MG capsule Take 1 capsule (300 mg total) by mouth 3 (three) times daily.  10/29/21     Lactobacillus Rhamnosus, GG, (CULTURELLE) CAPS Take 1 capsule by mouth daily 09/27/21     lactulose (CHRONULAC) 10 GM/15ML solution Take 30 mLs (20 g total) by mouth 3 (three) times daily. 04/08/21   Raiford Noble Latif, DO  lactulose (CHRONULAC) 10 GM/15ML solution Take 30 MLs by mouth 3 times per day 10/17/21     lactulose (CHRONULAC) 10 GM/15ML solution Take 30 mLs by mouth 2 (two) times daily as needed for Constipation (take as needed to have goal 2-3 bowel movements daily) for up to 30 days 10/29/21     levETIRAcetam (KEPPRA) 750 MG tablet Take 1 tablet (750 mg total) by mouth 2 (two) times daily. 07/06/21   Cameron Sprang, MD  Multiple Vitamins tablet Take 1 tablet by mouth once daily 10/29/21     Multiple Vitamins-Minerals (MULTIVITAMIN ADULT, MINERALS,) TABS Take 1 tablet by mouth daily 09/27/21     nadolol (CORGARD) 20 MG tablet Take 1 tablet (20 mg total) by mouth once daily 10/29/21     naltrexone (DEPADE) 50 MG tablet Take 1 tablet (50 mg total) by mouth daily. 06/29/21   Alcus Dad, MD  pantoprazole  (PROTONIX) 40 MG tablet Take 1 tablet (40 mg total) by mouth daily. 06/29/21   Alcus Dad, MD  pantoprazole (PROTONIX) 40 MG tablet Take 1 tablet (40 mg total) by mouth 2 (two) times daily before meals 10/29/21     spironolactone (ALDACTONE) 100 MG tablet Take 1 tablet (100 mg total) by mouth daily. 06/30/21   Alcus Dad, MD  thiamine (VITAMIN B-1) 100 MG tablet Take 1 tablet by mouth daily 09/27/21       Physical Exam: Vitals:   10/31/21 2130 10/31/21 2245 10/31/21 2300 11/01/21 0021  BP: 114/79 120/81 110/73   Pulse: 94 94 96   Resp: 16 18 18    Temp:      SpO2: 96% 97% 96%   Weight:    57.6 kg  Height:    4\' 11"  (1.499 m)    Constitutional: NAD, calm  Eyes: PERTLA, lids normal ENMT: Mucous membranes are moist. Posterior pharynx clear of any exudate or lesions.   Neck: supple, no masses  Respiratory: clear to auscultation bilaterally, no wheezing, no crackles. No accessory muscle use.  Cardiovascular: S1 & S2 heard, regular rate and rhythm. Pretibial pitting edema. Abdomen: distended, no rebound pain or guarding. Bowel sounds active.  Musculoskeletal: no clubbing / cyanosis. No joint deformity upper and lower extremities.   Skin: Jaundice. Beads of sweat on brow. Warm, well-perfused. Neurologic: CN 2-12 grossly intact. Moving all extremities. Alert and oriented.  Psychiatric: Calm. Cooperative.    Labs and Imaging on Admission: I have personally reviewed following labs and imaging studies  CBC: Recent Labs  Lab 10/31/21 2159 10/31/21 2213  WBC 4.1  --   NEUTROABS 2.5  --   HGB 8.6* 10.2*  HCT 27.5* 30.0*  MCV 92.9  --   PLT 148*  --    Basic Metabolic Panel: Recent Labs  Lab 10/31/21 2159 10/31/21 2213  NA 131* 135  K 3.4* 3.5  CL 99 96*  CO2 25  --   GLUCOSE 110* 104*  BUN 6 <3*  CREATININE 0.58 0.60  CALCIUM 7.7*  --    GFR: Estimated Creatinine Clearance: 65.8 mL/min (by C-G formula based on SCr of 0.6 mg/dL). Liver Function Tests: Recent Labs   Lab 10/31/21 2159  AST 87*  ALT 87*  ALKPHOS 191*  BILITOT 6.6*  PROT 6.1*  ALBUMIN 2.6*   Recent Labs  Lab 10/31/21 2159  LIPASE 288*   Recent Labs  Lab 10/31/21 2159  AMMONIA 19   Coagulation Profile: Recent Labs  Lab 10/31/21 2159  INR 1.3*   Cardiac Enzymes: No results for input(s): CKTOTAL, CKMB, CKMBINDEX, TROPONINI in the last 168 hours. BNP (last 3 results) No results for input(s): PROBNP in the last 8760 hours. HbA1C: No results for input(s): HGBA1C in the last 72 hours. CBG: No results for input(s): GLUCAP in the last 168 hours. Lipid Profile: No results for input(s): CHOL, HDL, LDLCALC, TRIG, CHOLHDL, LDLDIRECT in the last 72 hours. Thyroid Function Tests: No results for input(s): TSH, T4TOTAL, FREET4, T3FREE, THYROIDAB in the last 72 hours. Anemia Panel: No results for input(s): VITAMINB12, FOLATE, FERRITIN, TIBC, IRON, RETICCTPCT in the last 72 hours. Urine analysis:    Component Value Date/Time   COLORURINE AMBER (A) 06/25/2021 2045   APPEARANCEUR HAZY (A) 06/25/2021 2045   LABSPEC 1.011 06/25/2021 2045   PHURINE 6.0 06/25/2021 2045   GLUCOSEU NEGATIVE 06/25/2021 2045   HGBUR SMALL (A) 06/25/2021 2045   BILIRUBINUR SMALL (A) 06/25/2021 2045   BILIRUBINUR NEG 05/26/2020 1544   KETONESUR 20 (A) 06/25/2021 2045   PROTEINUR NEGATIVE 06/25/2021 2045   UROBILINOGEN 1.0 05/26/2020 1544   UROBILINOGEN 0.2 12/02/2017 0836   NITRITE NEGATIVE 06/25/2021 2045   LEUKOCYTESUR NEGATIVE 06/25/2021 2045   Sepsis Labs: @LABRCNTIP (procalcitonin:4,lacticidven:4) ) Recent Results (from the past 240 hour(s))  Resp Panel by RT-PCR (Flu A&B, Covid) Nasopharyngeal Swab     Status: None   Collection Time: 10/31/21 10:57 PM   Specimen: Nasopharyngeal Swab; Nasopharyngeal(NP) swabs in vial transport medium  Result Value Ref Range Status   SARS Coronavirus 2 by RT PCR NEGATIVE NEGATIVE Final    Comment: (NOTE) SARS-CoV-2 target nucleic acids are NOT  DETECTED.  The SARS-CoV-2 RNA is generally detectable in upper respiratory specimens during the acute phase of infection. The lowest concentration of SARS-CoV-2 viral copies this assay can detect is 138 copies/mL. A negative result does not preclude SARS-Cov-2 infection and should not be used as the sole basis for treatment or other patient management decisions. A negative result may occur with  improper specimen collection/handling, submission of specimen other than nasopharyngeal swab, presence of viral mutation(s) within the areas targeted by this assay, and inadequate number of viral copies(<138 copies/mL). A negative result must be combined with clinical observations, patient history, and epidemiological information. The expected result is Negative.  Fact Sheet for Patients:  EntrepreneurPulse.com.au  Fact Sheet for Healthcare Providers:  IncredibleEmployment.be  This test is no t yet approved or cleared by the [place] FDA and  has been authorized for detection and/or diagnosis of SARS-CoV-2 by FDA under an Emergency Use Authorization (EUA). This EUA will remain  in effect (meaning this test can be used) for the duration of the COVID-19 declaration under Section 564(b)(1) of the Act, 21 U.S.C.section 360bbb-3(b)(1), unless the authorization is terminated  or revoked sooner.       Influenza A by PCR NEGATIVE NEGATIVE Final   Influenza B by PCR NEGATIVE NEGATIVE Final    Comment: (NOTE) The Xpert Xpress SARS-CoV-2/FLU/RSV plus assay is intended as an aid in the diagnosis of influenza from Nasopharyngeal swab specimens and should not be used as a sole basis for treatment. Nasal washings and aspirates are unacceptable for Xpert Xpress SARS-CoV-2/FLU/RSV testing.  Fact Sheet for Patients: EntrepreneurPulse.com.au  Fact Sheet for Healthcare Providers: IncredibleEmployment.be  This test is  not yet  approved or cleared by the [place] and has been authorized for detection and/or diagnosis of SARS-CoV-2 by FDA under an Emergency Use Authorization (EUA). This EUA will remain in effect (meaning this test can be used) for the duration of the COVID-19 declaration under Section 564(b)(1) of the Act, 21 U.S.C. section 360bbb-3(b)(1), unless the authorization is terminated or revoked.  Performed at [facility], [place] [address] [place], [place] [address]      Radiological Exams on Admission: CT ABDOMEN PELVIS W CONTRAST  Result Date: 10/31/2021 CLINICAL DATA:  Abdominal pain, known cirrhosis EXAM: CT ABDOMEN AND PELVIS WITH CONTRAST TECHNIQUE: Multidetector CT imaging of the abdomen and pelvis was performed using the standard protocol following bolus administration of intravenous contrast. RADIATION DOSE REDUCTION: This exam was performed according to the departmental dose-optimization program which includes automated exposure control, adjustment of the mA and/or kV according to patient size and/or use of iterative reconstruction technique. CONTRAST:  160mL OMNIPAQUE IOHEXOL 300 MG/ML  SOLN COMPARISON:  09/22/2021 FINDINGS: Lower chest: Calcified left infrahilar lymph nodes are seen. Multiple esophageal varices are identified consistent with the patient's given clinical history. Mild atelectatic changes are noted in the bases. Hepatobiliary: Liver is small and nodular consistent with the given clinical history of cirrhosis. Cholelithiasis is noted without complicating factors. Pancreas: Pancreas is well visualized with some scattered pancreatic calcifications in the head of the pancreas. Spleen: Splenic granulomas are noted. Spleen is mildly enlarged consistent with the given clinical history of portal hypertension. Adrenals/Urinary Tract: Adrenal glands are within normal limits. Kidneys demonstrate a normal enhancement pattern bilaterally. No renal calculi or obstructive  changes are seen. Normal excretion of contrast is noted. The bladder is well distended. Stomach/Bowel: The appendix is within normal limits. No obstructive or inflammatory changes of colon are seen. Small bowel and stomach appear within normal limits. Multiple esophageal varices are seen. Vascular/Lymphatic: No significant vascular findings are present. No enlarged abdominal or pelvic lymph nodes. Diffuse pelvic varices are seen. Esophageal varices are noted as well. A large coronary vein is noted extending to the esophageal varices. Some mild perigastric varices are noted as well. Reproductive: Uterus and bilateral adnexa are unremarkable. Other: Mild to moderate ascites is noted consistent with the given clinical history of portal hypertension. Musculoskeletal: Degenerative changes of lumbar spine are noted. IMPRESSION: Changes consistent with cirrhosis of the liver with portal hypertension and splenomegaly as well as gastric and esophageal varices. Ascites is noted as well. Changes of prior granulomatous disease. Cholelithiasis without complicating factors. Diffuse pelvic varices. Electronically Signed   By: Inez Catalina M.D.   On: 10/31/2021 22:49   DG Chest Port 1 View  Result Date: 10/31/2021 CLINICAL DATA:  Shortness of breath EXAM: PORTABLE CHEST 1 VIEW COMPARISON:  10/21/2021 FINDINGS: Cardiac shadow is stable. Calcified hilar lymph nodes are noted. No focal infiltrate or effusion is seen. Bony structures are within normal limits. IMPRESSION: Acute abnormality noted. Electronically Signed   By: Inez Catalina M.D.   On: 10/31/2021 21:48    Assessment/Plan   1. Abdominal pain; cirrhosis with ascites   - Pt with alcoholic cirrhosis presents with abdominal pain and increased abdominal distension  - She underwent IV fluid resuscitation and transfusions during recent hospitalization and states she was planned for inpatient therapeutic paracentesis but insisted on being discharged prior to that  - MELD  score is 22 on admission; LFTs improved from recent admission; she continues to drink  - Consult IR for therapeutic paracentesis  -  Continue nadalol, Lasix, Aldactone, and lactulose  - Follow-up with GI for repeat EGD in 2-4 wks as planned   2. Alcohol dependence   - Drinks 1/2 gallon vodka daily  - EtOH level 11 in ED with mild withdrawal symptoms  - Monitor with CIWA scoring, treat with Ativan, encourage alcohol avoidance    3. Seizures  - Hx of seizures followed by neurology  - Continue Keppra   4. Anemia  - Hgb 8.6 in ED, up from 8.4 on 10/29/21  - No bleeding since recent hospitalization   5. Chronic ITP  - Follows with hematology-oncology, treated with Nplate  - Platelets 798X on admission - Continue outpatient follow-up    DVT prophylaxis: SCDs  Code Status: Full  Level of Care: Level of care: Med-Surg Family Communication: None present  Disposition Plan:  Patient is from: home  Anticipated d/c is to: Home  Anticipated d/c date is: 2/24 or 11/03/21  Patient currently: Pending paracentesis  Consults called: none  Admission status: Observation     Vianne Bulls, MD [facility]  11/01/2021, 12:31 AM

## 2021-11-01 NOTE — Assessment & Plan Note (Addendum)
Liver cirrhosis in the setting of alcohol abuse, HCV infection.  Will need close follow-up with her GI and PCP.

## 2021-11-01 NOTE — Procedures (Signed)
PROCEDURE SUMMARY:  Successful US guided paracentesis from right abdomen.  Yielded 2.2 L of dark yellow fluid.  No immediate complications.  Pt tolerated well.   Specimen sent for labs.  EBL < 2 mL  Theresa Duty, NP 11/01/2021 11:06 AM

## 2021-11-01 NOTE — Assessment & Plan Note (Addendum)
Follows up with hematology oncology , was treated with Nplate by her hem-onc.  Thrombocytopenia likely in the setting of cirrhosis as well.  Monitor Recent Labs  Lab 10/31/21 2159 11/01/21 0316 11/02/21 0307  PLT 148* 116* 90*

## 2021-11-01 NOTE — Hospital Course (Addendum)
67 y/ female with severe alcohol dependence, hepatitis C with sustained virologic response, decompensated cirrhosis/esophageal varices/ascites, seizure disorder,now presenting to ED with abdominal pain and increased abdominal distention, recent admission to [facility] on 10/22/2021 with hemorrhagic shock 2/2 esophageal varices bleed s/p fluid resuscitated,vasopressors,6 unit PRBC, 3 units platelets, 4 units FFP, 1 of cryo, and had emergent EGD with banding, and no further bleeding and she was discharged from hospital on 10/29/2021. She reports developing increased abdominal distention and bilateral leg swelling during the hospitalization, states that she was planned to undergo paracentesis, but was eager to get back, and insisted on being discharged prior to that. Back at home, she has not had any melena, hematochezia, or hematemesis, but continues to have abdominal distention with pain, particularly around the bilateral flanks.  She denies fevers or chills. In ED-found to be afebrile, saturating well on room air, and with stable blood pressure. Chemistry panel notable for elevated LFTs, slightly improved from time of recent discharge.CBC notable for hemoglobin of 8.6 and platelets 148,000, stable from recent discharge.  Ammonia level is normal and ethanol is 11.INR was 1.3.Patient was given IV morphine in the ED.Ct abd:"Changes consistent with cirrhosis of the liver with portal hypertension and splenomegaly as well as gastric and esophageal varices. Ascites is noted as well. Changes of prior granulomatous disease. Cholelithiasis without complicating factors". Hospitalists consulted for admission, US paracentesis ordered. Underwent successful 2.2 L of dark yellow fluid removal, peritoneal fluid negative Gram stain no evidence of SBP.  Patient was given IV albumin, treated with IV Lasix for anasarca.  Her labs remain relatively stable no evidence of bleeding.  Leg edema has improved and abdomen is less  distended.  Tolerating diet [name] ready for home today.  We discussed extensively about salt and fluid restriction and compliance with alcohol cessation and GI follow-up

## 2021-11-01 NOTE — Assessment & Plan Note (Signed)
SEE #1

## 2021-11-01 NOTE — Assessment & Plan Note (Addendum)
Still drinks half gallon of vodka daily per report-Alcohol level 11 in ED with mild withdrawal symptoms. Patient claims she has not drank in the last 10 days-drank once since her discharge from [facility]??.continue with CIWA Ativan multivitamins thiamine, encourage alcohol cessation, outpatient resources

## 2021-11-01 NOTE — Assessment & Plan Note (Addendum)
Resolved

## 2021-11-01 NOTE — Assessment & Plan Note (Addendum)
Abdominal pain Decompensated liver cirrhosis with ascites Anasarca: Patient with increasing abdominal distention/leg edema since her recent discharge from [facility]  for GIB, had IV fluid resuscitation transfusion EGD variceal banding.  Reports compliance to Lasix at home, in fact self medicated with 4 x 40 mg lasix 2/22.  s/p 2.2 L paracentesis with dark yellow fluid removal 2/23 peritoneal fluid negative Gram stain no evidence of SBP.S/P IV albumin Treated with IV Lasix 40 mg bid, along with nadolol, Aldactone multivitamins. Discharged on oral Lasix, advised GI follow-up fluid and salt restriction

## 2021-11-01 NOTE — Progress Notes (Signed)
PROGRESS NOTE Briana Sweeney  QPY:[id number] DOB: 1970/11/15 DOA: 10/31/2021 PCP: System, Provider Not In   Brief Narrative/Hospital Course: 25 y/ female with severe alcohol dependence, hepatitis C with sustained virologic response, decompensated cirrhosis/esophageal varices/ascites, seizure disorder,now presenting to ED with abdominal pain and increased abdominal distention, recent admission to [facility] on 10/22/2021 with hemorrhagic shock 2/2 esophageal varices bleed s/p fluid resuscitated,vasopressors,6 unit PRBC, 3 units platelets, 4 units FFP, 1 of cryo, and had emergent EGD with banding, and no further bleeding and she was discharged from hospital on 10/29/2021. She reports developing increased abdominal distention and bilateral leg swelling during the hospitalization, states that she was planned to undergo paracentesis, but was eager to get back, and insisted on being discharged prior to that. Back at home, she has not had any melena, hematochezia, or hematemesis, but continues to have abdominal distention with pain, particularly around the bilateral flanks.  She denies fevers or chills. In ED-found to be afebrile, saturating well on room air, and with stable blood pressure. Chemistry panel notable for elevated LFTs, slightly improved from time of recent discharge.CBC notable for hemoglobin of 8.6 and platelets 148,000, stable from recent discharge.  Ammonia level is normal and ethanol is 11.INR was 1.3.Patient was given IV morphine in the ED.Ct abd:"Changes consistent with cirrhosis of the liver with portal hypertension and splenomegaly as well as gastric and esophageal varices. Ascites is noted as well. Changes of prior granulomatous disease. Cholelithiasis without complicating factors". Hospitalists consulted for admission, US paracentesis ordered.    Subjective: Seen and examined this morning.  Just came back from abdominal paracentesis. Abdomen is less distended legs are still  edematous Would like to eat  Assessment and Plan: * Abdominal pain- (present on admission) Abdominal pain Decompensated liver cirrhosis with ascites Anasarca: Patient with increasing abdominal distention/leg edema since her recent discharge from [place]  for GIB, had IV fluid resuscitation transfusion EGD variceal banding.  Reports compliance to Lasix at home, in fact self medicated with 4 x 40 mg lasix 2/22. s/p IR therapeutic paracentesis and will  Support w/ albumin iv x1, keep on IV Lasix 40 twice daily, continue Aldactone and nadolol lactulose.   Follow fluid analysis  Anasarca Being diuresed  Esophageal 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 (HCC) SEE #1  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HCC)- (present on admission) Monitor platelet count.  Follows up with hematology oncology , was treated with Nplate by her hem-onc. Recent Labs  Lab 10/31/21 2159 11/01/21 0316  PLT 148* 116*    Hyponatremia In the setting of anasarca.  Continue diuresis monitor  Portal hypertension ([place])- (present on admission) In the setting of liver cirrhosis.  Continue meds as #1  Chronic anemia- (present on admission) Anemia of chronic disease and also with recent variceal bleeding where she needed multiple transfusion supports.  Hemoglobin high 7 to 10 g range.  Monitor Recent Labs  Lab 10/31/21 2159 10/31/21 2213 11/01/21 0316  HGB 8.6* 10.2* 7.9*  HCT 27.5* 30.0* 24.5*    Alcohol use disorder, severe, dependence ([place])- (present on admission) Still drinks half gallon of vodka daily.  Alcohol level 11 in ED with mild withdrawal symptoms.  But patient claims she has not drank in the last 10 days-drank once since her discharge from [place]??. continue with CIWA Ativan multivitamins thiamine, encourage alcohol cessation, outpatient resources  Alcoholic cirrhosis of liver with ascites and HCV infection- (present on admission) Liver cirrhosis in the setting of alcohol abuse, HCV infection.  Will need close  follow-up with her  GI and PCP  Hypomagnesemia Being repleted  Hypokalemia Being repleted.  Monitor   DVT prophylaxis: SCDs Start: 11/01/21 0029 Code Status:   Code Status: Full Code Family Communication: plan of care discussed with patient at bedside. Disposition: Currently NOT medically stable for discharge. Status is: Admitted as observation The patient will require care spanning > 2 midnights for ongoing management of her fluid overload anasarca  Objective: Vitals last 24 hrs: Vitals:   11/01/21 0149 11/01/21 0545 11/01/21 0923 11/01/21 0950  BP: (!) 107/59 (!) 93/57 95/79 (!) 89/57  Pulse: 92 92    Resp: 18 17    Temp: 98.3 F (36.8 C) 98.4 F (36.9 C)    TempSrc: Oral     SpO2: 98% 93%    Weight:      Height:       Weight change:   Physical Examination: General exam: AA0X3, older than stated age.   HEENT:Oral mucosa moist, Ear/Nose WNL grossly, dentition normal. Respiratory system: bilaterally diminished BS, no use of accessory muscle Cardiovascular system: S1 & S2 +, No JVD,. Gastrointestinal system: Abdomen soft, nontender, mildly distended,BS+ Nervous System:Alert, awake, moving extremities and grossly nonfocal Extremities: LE edema 2++,distal peripheral pulses palpable.  Skin: No rashes,no icterus. MSK: Normal muscle bulk,tone, power  Medications reviewed:  Scheduled Meds:  clonazePAM  0.5 mg Oral BID   FLUoxetine  40 mg Oral Daily   folic acid  1 mg Oral Daily   furosemide  40 mg Intravenous BID   gabapentin  300 mg Oral TID   lactulose  20 g Oral TID   levETIRAcetam  750 mg Oral BID   LORazepam  0-4 mg Oral Q6H   Followed by   Derrill Memo ON 11/03/2021] LORazepam  0-4 mg Oral Q12H   magnesium oxide  400 mg Oral BID   multivitamin with minerals  1 tablet Oral Daily   nadolol  20 mg Oral Daily   pantoprazole  40 mg Oral BID   potassium chloride  20 mEq Oral Daily   spironolactone  100 mg Oral Daily   thiamine  100 mg Oral Daily   Or   thiamine  100 mg  Intravenous Daily   Continuous Infusions:  albumin human     magnesium sulfate bolus IVPB        Diet Order             Diet 2 gram sodium Room service appropriate? Yes; Fluid consistency: Thin; Fluid restriction: 1200 mL Fluid  Diet effective now                   Intake/Output Summary (Last 24 hours) at 11/01/2021 1034 Last data filed at 11/01/2021 0545 Gross per 24 hour  Intake 60 ml  Output --  Net 60 ml   Net IO Since Admission: 60 mL [11/01/21 1034]  Wt Readings from Last 3 Encounters:  11/01/21 57.6 kg  10/17/21 62.1 kg  10/11/21 62.1 kg     Unresulted Labs (From admission, onward)     Start     Ordered   11/01/21 1001  Body fluid cell count with differential  RELEASE UPON ORDERING,   TIMED        11/01/21 1001   11/01/21 1001  Body fluid culture w Gram Stain  RELEASE UPON ORDERING,   TIMED        11/01/21 1001          Data Reviewed: I have personally reviewed following labs and imaging studies CBC:  Recent Labs  Lab 10/31/21 2159 10/31/21 2213 11/01/21 0316  WBC 4.1  --  3.9*  NEUTROABS 2.5  --   --   HGB 8.6* 10.2* 7.9*  HCT 27.5* 30.0* 24.5*  MCV 92.9  --  92.1  PLT 148*  --  419*   Basic Metabolic Panel: Recent Labs  Lab 10/31/21 2159 10/31/21 2213 11/01/21 0316  NA 131* 135 132*  K 3.4* 3.5 3.1*  CL 99 96* 100  CO2 25  --  25  GLUCOSE 110* 104* 100*  BUN 6 <3* 6  CREATININE 0.58 0.60 0.56  CALCIUM 7.7*  --  7.6*  MG  --   --  1.5*   GFR: Estimated Creatinine Clearance: 65.8 mL/min (by C-G formula based on SCr of 0.56 mg/dL). Liver Function Tests: Recent Labs  Lab 10/31/21 2159 11/01/21 0316  AST 87* 72*  ALT 87* 75*  ALKPHOS 191* 163*  BILITOT 6.6* 5.4*  PROT 6.1* 5.2*  ALBUMIN 2.6* 2.2*   Recent Labs  Lab 10/31/21 2159  LIPASE 288*   Recent Labs  Lab 10/31/21 2159  AMMONIA 19   Coagulation Profile: Recent Labs  Lab 10/31/21 2159 11/01/21 0316  INR 1.3* 1.4*   Cardiac Enzymes: No results for  input(s): CKTOTAL, CKMB, CKMBINDEX, TROPONINI in the last 168 hours. BNP (last 3 results) No results for input(s): PROBNP in the last 8760 hours. HbA1C: No results for input(s): HGBA1C in the last 72 hours. CBG: No results for input(s): GLUCAP in the last 168 hours. Lipid Profile: No results for input(s): CHOL, HDL, LDLCALC, TRIG, CHOLHDL, LDLDIRECT in the last 72 hours. Thyroid Function Tests: No results for input(s): TSH, T4TOTAL, FREET4, T3FREE, THYROIDAB in the last 72 hours. Anemia Panel: No results for input(s): VITAMINB12, FOLATE, FERRITIN, TIBC, IRON, RETICCTPCT in the last 72 hours. Sepsis Labs: No results for input(s): PROCALCITON, LATICACIDVEN in the last 168 hours.  Recent Results (from the past 240 hour(s))  Resp Panel by RT-PCR (Flu A&B, Covid) Nasopharyngeal Swab     Status: None   Collection Time: 10/31/21 10:57 PM   Specimen: Nasopharyngeal Swab; Nasopharyngeal(NP) swabs in vial transport medium  Result Value Ref Range Status   SARS Coronavirus 2 by RT PCR NEGATIVE NEGATIVE Final    Comment: (NOTE) SARS-CoV-2 target nucleic acids are NOT DETECTED.  The SARS-CoV-2 RNA is generally detectable in upper respiratory specimens during the acute phase of infection. The lowest concentration of SARS-CoV-2 viral copies this assay can detect is 138 copies/mL. A negative result does not preclude SARS-Cov-2 infection and should not be used as the sole basis for treatment or other patient management decisions. A negative result may occur with  improper specimen collection/handling, submission of specimen other than nasopharyngeal swab, presence of viral mutation(s) within the areas targeted by this assay, and inadequate number of viral copies(<138 copies/mL). A negative result must be combined with clinical observations, patient history, and epidemiological information. The expected result is Negative.  Fact Sheet for Patients:   EntrepreneurPulse.com.au  Fact Sheet for Healthcare Providers:  IncredibleEmployment.be  This test is no t yet approved or cleared by the [place] FDA and  has been authorized for detection and/or diagnosis of SARS-CoV-2 by FDA under an Emergency Use Authorization (EUA). This EUA will remain  in effect (meaning this test can be used) for the duration of the COVID-19 declaration under Section 564(b)(1) of the Act, 21 U.S.C.section 360bbb-3(b)(1), unless the authorization is terminated  or revoked sooner.  Influenza A by PCR NEGATIVE NEGATIVE Final   Influenza B by PCR NEGATIVE NEGATIVE Final    Comment: (NOTE) The Xpert Xpress SARS-CoV-2/FLU/RSV plus assay is intended as an aid in the diagnosis of influenza from Nasopharyngeal swab specimens and should not be used as a sole basis for treatment. Nasal washings and aspirates are unacceptable for Xpert Xpress SARS-CoV-2/FLU/RSV testing.  Fact Sheet for Patients: EntrepreneurPulse.com.au  Fact Sheet for Healthcare Providers: IncredibleEmployment.be  This test is not yet approved or cleared by the [place] FDA and has been authorized for detection and/or diagnosis of SARS-CoV-2 by FDA under an Emergency Use Authorization (EUA). This EUA will remain in effect (meaning this test can be used) for the duration of the COVID-19 declaration under Section 564(b)(1) of the Act, 21 U.S.C. section 360bbb-3(b)(1), unless the authorization is terminated or revoked.  Performed at [facility], [place] [address], [place], [place] [address]     Antimicrobials: Anti-infectives (From admission, onward)    None      Culture/Microbiology    Component Value Date/Time   SDES ASCITIC 06/26/2021 1521   SDES ASCITIC 06/26/2021 1521   Patrick 06/26/2021 1521   Neche 06/26/2021 1521   CULT  06/26/2021 1521    NO GROWTH 5  DAYS Performed at [facility], [place] [address] [place], [place] [address]    REPTSTATUS 06/28/2021 FINAL 06/26/2021 1521   REPTSTATUS 07/03/2021 FINAL 06/26/2021 1521  Other culture-see note  Radiology Studies: CT ABDOMEN PELVIS W CONTRAST  Result Date: 10/31/2021 CLINICAL DATA:  Abdominal pain, known cirrhosis EXAM: CT ABDOMEN AND PELVIS WITH CONTRAST TECHNIQUE: Multidetector CT imaging of the abdomen and pelvis was performed using the standard protocol following bolus administration of intravenous contrast. RADIATION DOSE REDUCTION: This exam was performed according to the departmental dose-optimization program which includes automated exposure control, adjustment of the mA and/or kV according to patient size and/or use of iterative reconstruction technique. CONTRAST:  134mL OMNIPAQUE IOHEXOL 300 MG/ML  SOLN COMPARISON:  09/22/2021 FINDINGS: Lower chest: Calcified left infrahilar lymph nodes are seen. Multiple esophageal varices are identified consistent with the patient's given clinical history. Mild atelectatic changes are noted in the bases. Hepatobiliary: Liver is small and nodular consistent with the given clinical history of cirrhosis. Cholelithiasis is noted without complicating factors. Pancreas: Pancreas is well visualized with some scattered pancreatic calcifications in the head of the pancreas. Spleen: Splenic granulomas are noted. Spleen is mildly enlarged consistent with the given clinical history of portal hypertension. Adrenals/Urinary Tract: Adrenal glands are within normal limits. Kidneys demonstrate a normal enhancement pattern bilaterally. No renal calculi or obstructive changes are seen. Normal excretion of contrast is noted. The bladder is well distended. Stomach/Bowel: The appendix is within normal limits. No obstructive or inflammatory changes of colon are seen. Small bowel and stomach appear within normal limits. Multiple esophageal varices are seen. Vascular/Lymphatic: No  significant vascular findings are present. No enlarged abdominal or pelvic lymph nodes. Diffuse pelvic varices are seen. Esophageal varices are noted as well. A large coronary vein is noted extending to the esophageal varices. Some mild perigastric varices are noted as well. Reproductive: Uterus and bilateral adnexa are unremarkable. Other: Mild to moderate ascites is noted consistent with the given clinical history of portal hypertension. Musculoskeletal: Degenerative changes of lumbar spine are noted. IMPRESSION: Changes consistent with cirrhosis of the liver with portal hypertension and splenomegaly as well as gastric and esophageal varices. Ascites is noted as well. Changes of prior granulomatous disease. Cholelithiasis  without complicating factors. Diffuse pelvic varices. Electronically Signed   By: Inez Catalina M.D.   On: 10/31/2021 22:49   DG Chest Port 1 View  Result Date: 10/31/2021 CLINICAL DATA:  Shortness of breath EXAM: PORTABLE CHEST 1 VIEW COMPARISON:  10/21/2021 FINDINGS: Cardiac shadow is stable. Calcified hilar lymph nodes are noted. No focal infiltrate or effusion is seen. Bony structures are within normal limits. IMPRESSION: Acute abnormality noted. Electronically Signed   By: Inez Catalina M.D.   On: 10/31/2021 21:48     LOS: 0 days   Antonieta Pert, MD [facility]  11/01/2021, 10:34 AM

## 2021-11-02 ENCOUNTER — Other Ambulatory Visit (HOSPITAL_COMMUNITY): Payer: Self-pay

## 2021-11-02 DIAGNOSIS — R601 Generalized edema: Secondary | ICD-10-CM

## 2021-11-02 LAB — COMPREHENSIVE METABOLIC PANEL
ALT: 58 U/L — ABNORMAL HIGH (ref 0–44)
AST: 74 U/L — ABNORMAL HIGH (ref 15–41)
Albumin: 3.1 g/dL — ABNORMAL LOW (ref 3.5–5.0)
Alkaline Phosphatase: 159 U/L — ABNORMAL HIGH (ref 38–126)
Anion gap: 7 (ref 5–15)
BUN: 6 mg/dL (ref 6–20)
CO2: 25 mmol/L (ref 22–32)
Calcium: 8 mg/dL — ABNORMAL LOW (ref 8.9–10.3)
Chloride: 101 mmol/L (ref 98–111)
Creatinine, Ser: 0.6 mg/dL (ref 0.44–1.00)
GFR, Estimated: >60 mL/min (ref >60)
Glucose, Bld: 113 mg/dL — ABNORMAL HIGH (ref 70–99)
Potassium: 3.9 mmol/L (ref 3.5–5.1)
Sodium: 133 mmol/L — ABNORMAL LOW (ref 135–145)
Total Bilirubin: 5.5 mg/dL — ABNORMAL HIGH (ref 0.3–1.2)
Total Protein: 6 g/dL — ABNORMAL LOW (ref 6.5–8.1)

## 2021-11-02 LAB — CBC
HCT: 28 % — ABNORMAL LOW (ref 36.0–46.0)
Hemoglobin: 8.7 g/dL — ABNORMAL LOW (ref 12.0–15.0)
MCH: 29.6 pg (ref 26.0–34.0)
MCHC: 31.1 g/dL (ref 30.0–36.0)
MCV: 95.2 fL (ref 80.0–100.0)
Platelets: 90 10*3/uL — ABNORMAL LOW (ref 150–400)
RBC: 2.94 MIL/uL — ABNORMAL LOW (ref 3.87–5.11)
RDW: 19.5 % — ABNORMAL HIGH (ref 11.5–15.5)
WBC: 3.6 10*3/uL — ABNORMAL LOW (ref 4.0–10.5)
nRBC: 0 % (ref 0.0–0.2)

## 2021-11-02 LAB — MAGNESIUM: Magnesium: 2.1 mg/dL (ref 1.7–2.4)

## 2021-11-02 LAB — PATHOLOGIST SMEAR REVIEW

## 2021-11-02 MED ORDER — FUROSEMIDE 40 MG PO TABS
40.0000 mg | ORAL_TABLET | Freq: Two times a day (BID) | ORAL | 0 refills | Status: DC
  Filled 2021-11-02: qty 30, 15d supply, fill #0

## 2021-11-02 NOTE — Plan of Care (Signed)
Problem: Health Behavior/Discharge Planning: Goal: Ability to manage health-related needs will improve Outcome: Progressing   Problem: Clinical Measurements: Goal: Ability to maintain clinical measurements within normal limits will improve Outcome: Progressing   Problem: Pain Managment: Goal: General experience of comfort will improve Outcome: [place], RN 11/02/21 9:43 AM

## 2021-11-02 NOTE — Discharge Summary (Signed)
Physician Discharge Summary   Patient: Briana Sweeney MRN: [id number] DOB: 1971-02-02  Admit date:     10/31/2021  Discharge date: 11/02/21  Discharge Physician: Briana Sweeney   PCP: Briana Sweeney   Recommendations at discharge:   PCP, GI CBC BMP 1  Hospital Course: 61 y/ female with severe alcohol dependence, hepatitis C with sustained virologic response, decompensated cirrhosis/esophageal varices/ascites, seizure disorder,now presenting to ED with abdominal pain and increased abdominal distention, recent admission to [facility] on 10/22/2021 with hemorrhagic shock 2/2 esophageal varices bleed s/p fluid resuscitated,vasopressors,6 unit PRBC, 3 units platelets, 4 units FFP, 1 of cryo, and had emergent EGD with banding, and no further bleeding and she was discharged from hospital on 10/29/2021. She reports developing increased abdominal distention and bilateral leg swelling during the hospitalization, states that she was planned to undergo paracentesis, but was eager to get back, and insisted on being discharged prior to that. Back at home, she has not had any melena, hematochezia, or hematemesis, but continues to have abdominal distention with pain, particularly around the bilateral flanks.  She denies fevers or chills. [facility] ED-found to be afebrile, saturating well on room air, and with stable blood pressure. Chemistry panel notable for elevated LFTs, slightly improved from time of recent discharge.CBC notable for hemoglobin of 8.6 and platelets 148,000, stable from recent discharge.  Ammonia level is normal and ethanol is 11.INR was 1.3.Patient was given IV morphine Sweeney the ED.Ct abd:"Changes consistent with cirrhosis of the liver with portal hypertension and splenomegaly as well as gastric and esophageal varices. Ascites is noted as well. Changes of prior granulomatous disease. Cholelithiasis without complicating factors". Hospitalists consulted for admission, US paracentesis  ordered. Underwent successful 2.2 L of dark yellow fluid removal, peritoneal fluid negative Gram stain no evidence of SBP.  Patient was given IV albumin, treated with IV Lasix for anasarca.  Her labs remain relatively stable no evidence of bleeding.  Leg edema has improved and abdomen is less distended.  Tolerating diet [name] ready for home today.  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  Reports compliance to Lasix at home, [facility]ey fact self medicated with 4 x 40 mg lasix 2/22.  s/p 2.2 L paracentesis with dark yellow fluid removal 2/23 peritoneal fluid negative Gram stain no evidence of SBP.S/P IV albumin Treated with IV Lasix 40 mg bid, along with nadolol, Aldactone multivitamins. Discharged on oral Lasix, advised GI follow-up fluid and salt restriction   Anasarca .  Esophageal varices Sweeney cirrhosis (HCC) SEE #1  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HCC)- (present on admission) Follows up with hematology oncology , was treated with Nplate by her hem-onc.  Thrombocytopenia likely Sweeney the setting of cirrhosis as well.  Monitor Recent Labs  Lab 10/31/21 2159 11/01/21 0316 11/02/21 0307  PLT 148* 116* 90*    Hyponatremia Mild, Sweeney the  setting of anasarca.on  lasix  Portal hypertension (Briana Sweeney)- (present on admission) Sweeney the setting of liver cirrhosis.  Continue nadolol  Chronic anemia- (present on admission) Anemia of chronic disease and also with recent variceal bleeding where she needed multiple transfusion supports. Hb stable Sweeney 8s. Recent Labs  Lab 10/31/21 2159 10/31/21 2213 11/01/21 0316 11/02/21 0307  HGB 8.6* 10.2* 7.9* 8.7*  HCT 27.5* 30.0* 24.5* 28.0*    Alcohol use disorder, severe, dependence (HCC)- (present on admission) Still drinks half gallon of vodka daily per report-Alcohol level 11 [facility] ED with mild withdrawal symptoms. 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  Will need close follow-up with her GI and PCP.   Hypomagnesemia Resolved  Hypokalemia Resolved.      Consultants:IR Procedures performed: Paracentesis Disposition: Home Diet recommendation:  Diet Orders (From admission, onward)     Start     Ordered   11/01/21 1022  Diet 2 gram sodium Room service appropriate? Yes; Fluid consistency: Thin; Fluid restriction: 1200 mL Fluid  Diet effective now       Question Answer Comment  Room service appropriate? Yes   Fluid consistency: Thin   Fluid restriction: 1200 mL Fluid      11/01/21 1021             DISCHARGE MEDICATION: Allergies as of 11/02/2021       Reactions   Other Other (See Comments)   Platelets: Rx chest pain, tremors, body aches        Medication List     STOP taking these medications    amoxicillin-clavulanate 500-125 MG tablet Commonly known as: Augmentin   Culturelle Caps   diclofenac Sodium 1 % Gel Commonly known as: VOLTAREN   gabapentin 300 MG capsule Commonly known  as: NEURONTIN   thiamine 100 MG tablet Commonly known as: VITAMIN B-1       TAKE these medications    ARIPiprazole 10 MG tablet Commonly known as: ABILIFY Take 1 tablet by mouth daily. What changed: Another medication with the same name was removed. Continue taking this medication, and follow the directions you see here.   clonazePAM 0.5 MG tablet Commonly known as: KLONOPIN Take 1 tablet by mouth 2 times daily.   FLUoxetine 40 MG capsule Commonly known as: PROZAC Take 1 capsule (40 mg total) by mouth daily. What changed: Another medication with the same name was removed. Continue taking this medication, and follow the directions you see here.   folic acid 1 MG tablet Commonly known as: FOLVITE Take 1 tablet (1 mg total) by mouth once daily   furosemide 40 MG tablet Commonly known as: LASIX Take 1 tablet (40 mg total) by mouth 2 (two) times daily for 15 days. After 15 days go back to 40 mg daily or as per your primary care or GI doctor What changed:  when to take this additional instructions   lactulose 10 GM/15ML solution Commonly known as: CHRONULAC Take 30 mLs by mouth 2 (two) times daily as needed for Constipation (take as needed to have goal 2-3 bowel movements daily) for up to 30 days   levETIRAcetam 750 MG tablet Commonly known as: KEPPRA Take 1 tablet (750 mg total) by mouth 2 (two) times daily.   Multiple  Vitamins tablet Take 1 tablet by mouth once daily   nadolol 20 MG tablet Commonly known as: CORGARD Take 1 tablet (20 mg total) by mouth once daily   naltrexone 50 MG tablet Commonly known as: DEPADE Take 1 tablet (50 mg total) by mouth daily.   pantoprazole 40 MG tablet Commonly known as: PROTONIX Take 1 tablet (40 mg total) by mouth 2 (two) times daily before meals What changed: Another medication with the same name was removed. Continue taking this medication, and follow the directions you see here.   spironolactone 100 MG tablet Commonly known  as: ALDACTONE Take 1 tablet (100 mg total) by mouth daily.        Follow-up Information     [facility] Follow up S[facility]ey 1 week(s).   Specialty: Gastroenterology Contact information: [place] [address] [place] [facility] Follow up [facility] 1 week(s).   Contact information: [place] [address] [phone]                Discharge Exam: Danley Danker Weights   10/31/21 2129 11/01/21 0021 11/02/21 1601  Weight: 57.6 kg 57.6 kg 65.9 kg  Condition at discharge: fair  The results of significant diagnostics from this hospitalization (including imaging, microbiology, ancillary and laboratory) are listed below for reference.   Imaging Studies: CT ABDOMEN PELVIS W CONTRAST  Result Date: 10/31/2021 CLINICAL DATA:  Abdominal pain, known cirrhosis EXAM: CT ABDOMEN AND PELVIS WITH CONTRAST TECHNIQUE: Multidetector CT imaging of the abdomen and pelvis was performed using the standard protocol following bolus administration of intravenous contrast. RADIATION DOSE REDUCTION: This exam was performed according to the departmental dose-optimization program which includes automated exposure control, adjustment of the mA and/or kV according to patient size and/or use of iterative reconstruction technique. CONTRAST:  173mL OMNIPAQUE IOHEXOL 300 MG/ML  SOLN COMPARISON:  09/22/2021 FINDINGS: Lower chest: Calcified left infrahilar lymph nodes are seen. Multiple esophageal varices are identified consistent with the patient's given clinical history. Mild atelectatic changes are noted S[facility]ey the bases. Hepatobiliary: Liver is small and nodular consistent with the given clinical history of cirrhosis. Cholelithiasis is noted without complicating factors. Pancreas: Pancreas is well visualized with some scattered pancreatic calcifications S[facility]ey the head of the pancreas. Spleen: Splenic granulomas are  noted. Spleen is mildly enlarged consistent with the given clinical history of portal hypertension. Adrenals/Urinary Tract: Adrenal glands are within normal limits. Kidneys demonstrate a normal enhancement pattern bilaterally. No renal calculi or obstructive changes are seen. Normal excretion of contrast is noted. The bladder is well distended. Stomach/Bowel: The appendix is within normal limits. No obstructive or inflammatory changes of colon are seen. Small bowel and stomach appear within normal limits. Multiple esophageal varices are seen. Vascular/Lymphatic: No significant vascular findings are present. No enlarged abdominal or pelvic lymph nodes. Diffuse pelvic varices are seen. Esophageal varices are noted as well. A large coronary vein is noted extending to the esophageal varices. Some mild perigastric varices are noted as well. Reproductive: Uterus and bilateral adnexa are unremarkable. Other: Mild to moderate ascites is noted consistent with the given clinical history of portal hypertension. Musculoskeletal: Degenerative changes of lumbar spine are noted. IMPRESSION: Changes consistent with cirrhosis of the liver with portal hypertension and splenomegaly as well as gastric and esophageal varices. Ascites is noted as well. Changes of prior granulomatous disease. Cholelithiasis without complicating factors. Diffuse pelvic varices. Electronically Signed   By: Elta Guadeloupe  Lukens M.D.   On: 10/31/2021 22:49   US Paracentesis  Result Date: 11/01/2021 INDICATION: Patient with a history of alcoholic cirrhosis and recurrent ascites presents today for a diagnostic and therapeutic paracentesis. EXAM: ULTRASOUND GUIDED PARACENTESIS MEDICATIONS: 1% lidocaine 10 mL COMPLICATIONS: None immediate. PROCEDURE: Informed written consent was obtained from the patient after a discussion of the risks, benefits and alternatives to treatment. A timeout was performed prior to the initiation of the procedure. Initial ultrasound  scanning demonstrates a large amount of ascites within the right lower abdominal quadrant. The right lower abdomen was prepped and draped Sweeney the usual sterile fashion. 1% lidocaine was used for local anesthesia. Following this, a 19 gauge, 7-cm, Yueh catheter was introduced. An ultrasound image was saved for documentation purposes. The paracentesis was performed. The catheter was removed and a dressing was applied. The patient tolerated the procedure well without immediate post procedural complication. Patient received post-procedure intravenous albumin; see nursing notes for details. FINDINGS: A total of approximately 2.2 L of dark yellow fluid was removed. Samples were sent to the laboratory as requested by the clinical team. IMPRESSION: Successful ultrasound-guided paracentesis yielding 2.2 liters of peritoneal fluid. Read by: Soyla Dryer, NP Electronically Signed   By: Aletta Edouard M.D.   On: 11/01/2021 11:20   DG Chest Port 1 View  Result Date: 10/31/2021 CLINICAL DATA:  Shortness of breath EXAM: PORTABLE CHEST 1 VIEW COMPARISON:  10/21/2021 FINDINGS: Cardiac shadow is stable. Calcified hilar lymph nodes are noted. No focal infiltrate or effusion is seen. Bony structures are within normal limits. IMPRESSION: Acute abnormality noted. Electronically Signed   By: Inez Catalina M.D.   On: 10/31/2021 21:48    Microbiology: Results for orders placed or performed during the hospital encounter of 10/31/21  Resp Panel by RT-PCR (Flu A&B, Covid) Nasopharyngeal Swab     Status: None   Collection Time: 10/31/21 10:57 PM   Specimen: Nasopharyngeal Swab; Nasopharyngeal(NP) swabs S[facility]ey vial transport medium  Result Value Ref Range Status   SARS Coronavirus 2 by RT PCR NEGATIVE NEGATIVE Final    Comment: (NOTE) SARS-CoV-2 target nucleic acids are NOT DETECTED.  The SARS-CoV-2 RNA is generally detectable S[facility]ey upper respiratory specimens during the acute phase of infection. The lowest concentration of  SARS-CoV-2 viral copies this assay can detect is 138 copies/mL. A negative result does not preclude SARS-Cov-2 infection and should not be used as the sole basis for treatment or other patient management decisions. A negative result may occur with  improper specimen collection/handling, submission of specimen other than nasopharyngeal swab, presence of viral mutation(s) within the areas targeted by this assay, and inadequate number of viral copies(<138 copies/mL). A negative result must be combined with clinical observations, patient history, and epidemiological information. The expected result is Negative.  Fact Sheet for Patients:  EntrepreneurPulse.com.au  Fact Sheet for Healthcare Providers:  IncredibleEmployment.be  This test is no t yet approved or cleared by the [place] FDA and  has been authorized for detection and/or diagnosis of SARS-CoV-2 by FDA under an Emergency Use Authorization (EUA). This EUA will remain  S[facility]ey effect (meaning this test can be used) for the duration of the COVID-19 declaration under Section 564(b)(1) of the Act, 21 U.S.C.section 360bbb-3(b)(1), unless the authorization is terminated  or revoked sooner.       Influenza A by PCR NEGATIVE NEGATIVE Final   Influenza B by PCR NEGATIVE NEGATIVE Final    Comment: (NOTE) The Xpert Xpress SARS-CoV-2/FLU/RSV plus assay is intended as an aid  S[facility]ey the diagnosis of influenza from Nasopharyngeal swab specimens and should not be used as a sole basis for treatment. Nasal washings and aspirates are unacceptable for Xpert Xpress SARS-CoV-2/FLU/RSV testing.  Fact Sheet for Patients: EntrepreneurPulse.com.au  Fact Sheet for Healthcare Providers: IncredibleEmployment.be  This test is not yet approved or cleared by the [place] FDA and has been authorized for detection and/or diagnosis of SARS-CoV-2 by FDA under an Emergency Use  Authorization (EUA). This EUA will remain [facility]ey effect (meaning this test can be used) for the duration of the COVID-19 declaration under Section 564(b)(1) of the Act, 21 U.S.C. section 360bbb-3(b)(1), unless the authorization is terminated or revoked.  Performed at [facility], [place] [address], [place], [place] [address]   Body fluid culture w Gram Stain     Status: None (Preliminary result)   Collection Time: 11/01/21 10:02 AM   Specimen: Abdomen; Peritoneal Fluid  Result Value Ref Range Status   Specimen Description   Final    ABDOMEN Performed at [place] [address], [place], [place] [address]    Special Requests   Final    PERITONEAL Performed at [facility], [place] [address], [place], [place] [address]    Gram Stain   Final    WBC PRESENT, PREDOMINANTLY MONONUCLEAR NO ORGANISMS SEEN CYTOSPIN SMEAR Performed at [facility], [place] [address], [place], [place] [address]    Culture PENDING  Incomplete   Report Status PENDING  Incomplete    Labs: CBC: Recent Labs  Lab 10/31/21 2159 10/31/21 2213 11/01/21 0316 11/02/21 0307  WBC 4.1  --  3.9* 3.6*  NEUTROABS 2.5  --   --   --   HGB 8.6* 10.2* 7.9* 8.7*  HCT 27.5* 30.0* 24.5* 28.0*  MCV 92.9  --  92.1 95.2  PLT 148*  --  116* 90*   Basic Metabolic Panel: Recent Labs  Lab 10/31/21 2159 10/31/21 2213 11/01/21 0316 11/02/21 0307  NA 131* 135 132* 133*  K 3.4* 3.5 3.1* 3.9  CL 99 96* 100 101  CO2 25  --  25 25  GLUCOSE 110* 104* 100* 113*  BUN 6 <3* 6 6  CREATININE 0.58 0.60 0.56 0.60  CALCIUM 7.7*  --  7.6* 8.0*  MG  --   --  1.5* 2.1   Liver Function Tests: Recent Labs  Lab 10/31/21 2159 11/01/21 0316 11/02/21 0307  AST 87* 72* 74*  ALT 87* 75* 58*  ALKPHOS 191* 163* 159*  BILITOT 6.6* 5.4* 5.5*  PROT 6.1* 5.2* 6.0*  ALBUMIN 2.6* 2.2* 3.1*   CBG: No results for input(s): GLUCAP S[facility]ey the last 168 hours.  Discharge time spent: 25  minutes.  Signed: Antonieta Pert, MD [facility] 11/02/2021

## 2021-11-02 NOTE — Plan of Care (Signed)
Patient discharged home via private vehicle with friend Herbie Baltimore.  Ivan Anchors, RN 11/02/21 11:11 AM

## 2021-11-04 LAB — BODY FLUID CULTURE W GRAM STAIN: Culture: NO GROWTH

## 2021-11-05 ENCOUNTER — Inpatient Hospital Stay: Payer: Commercial Managed Care - HMO

## 2021-11-07 ENCOUNTER — Inpatient Hospital Stay: Payer: Commercial Managed Care - HMO

## 2021-11-11 ENCOUNTER — Other Ambulatory Visit (HOSPITAL_COMMUNITY): Payer: Self-pay

## 2021-11-12 ENCOUNTER — Other Ambulatory Visit: Payer: Self-pay

## 2021-11-12 ENCOUNTER — Encounter: Payer: Self-pay | Admitting: Hematology and Oncology

## 2021-11-12 ENCOUNTER — Inpatient Hospital Stay: Payer: Commercial Managed Care - HMO | Attending: Hematology & Oncology

## 2021-11-12 ENCOUNTER — Other Ambulatory Visit (HOSPITAL_COMMUNITY): Payer: Self-pay

## 2021-11-12 DIAGNOSIS — D693 Immune thrombocytopenic purpura: Secondary | ICD-10-CM | POA: Insufficient documentation

## 2021-11-12 DIAGNOSIS — D696 Thrombocytopenia, unspecified: Secondary | ICD-10-CM

## 2021-11-12 LAB — CBC: RBC: 3.31 — AB (ref 3.87–5.11)

## 2021-11-12 LAB — CBC AND DIFFERENTIAL
HCT: 29 — AB (ref 36–46)
Hemoglobin: 9.3 — AB (ref 12.0–16.0)
Neutrophils Absolute: 0.96
Platelets: 59 — AB (ref 150–399)
WBC: 2.4

## 2021-11-12 NOTE — Progress Notes (Signed)
Spoke with [employer] and scheduled transportation for DOS: 11/19/2021, 11/21/2021, 11/26/2021, and 11/28/2021. ?

## 2021-11-13 ENCOUNTER — Other Ambulatory Visit: Payer: Self-pay | Admitting: Pharmacist

## 2021-11-14 ENCOUNTER — Inpatient Hospital Stay: Payer: Commercial Managed Care - HMO

## 2021-11-14 MED FILL — Romiplostim For Inj 250 MCG: SUBCUTANEOUS | Qty: 0.5 | Status: AC

## 2021-11-15 ENCOUNTER — Telehealth: Payer: Self-pay

## 2021-11-15 NOTE — Telephone Encounter (Signed)
?  Estill Dooms, pharmacist: Her platelets were 59 but it is difficult to get her back in asap because she rides [employer].  We have her scheduled next week already so I would just tell her to please make those appts. ? ?Danette Weinfeld,RN: I sent message to Ferne Coe, infusion nurse and Lynnae Sandhoff to ask about R/S injection appt. ?

## 2021-11-19 ENCOUNTER — Other Ambulatory Visit: Payer: Self-pay

## 2021-11-19 ENCOUNTER — Encounter: Payer: Self-pay | Admitting: Oncology

## 2021-11-19 ENCOUNTER — Inpatient Hospital Stay: Payer: Commercial Managed Care - HMO

## 2021-11-19 DIAGNOSIS — D696 Thrombocytopenia, unspecified: Secondary | ICD-10-CM

## 2021-11-19 LAB — CBC AND DIFFERENTIAL
HCT: 27 — AB (ref 36–46)
Hemoglobin: 8.4 — AB (ref 12.0–16.0)
Neutrophils Absolute: 1.18
Platelets: 10 10*3/uL — AB (ref 150–400)
WBC: 1.9

## 2021-11-19 LAB — CBC: RBC: 3.11 — AB (ref 3.87–5.11)

## 2021-11-19 NOTE — Progress Notes (Signed)
Due to notes, critical called to [facility]  ?

## 2021-11-20 ENCOUNTER — Inpatient Hospital Stay: Payer: Commercial Managed Care - HMO

## 2021-11-20 VITALS — BP 110/68 | HR 110 | Temp 98.0°F | Resp 18 | Ht 61.42 in | Wt 139.2 lb

## 2021-11-20 DIAGNOSIS — D696 Thrombocytopenia, unspecified: Secondary | ICD-10-CM

## 2021-11-20 DIAGNOSIS — D693 Immune thrombocytopenic purpura: Secondary | ICD-10-CM | POA: Diagnosis not present

## 2021-11-20 MED ORDER — ROMIPLOSTIM 250 MCG ~~LOC~~ SOLR
250.0000 ug | Freq: Once | SUBCUTANEOUS | Status: AC
  Administered 2021-11-20: 250 ug via SUBCUTANEOUS
  Filled 2021-11-20: qty 0.5

## 2021-11-20 NOTE — Patient Instructions (Signed)
Romiplostim injection ?What is this medication? ?ROMIPLOSTIM (roe mi PLOE stim) helps your body make more platelets. This medicine is used to treat low platelets caused by chronic idiopathic thrombocytopenic purpura (ITP) or a bone marrow syndrome caused by radiation sickness. ?This medicine may be used for other purposes; ask your health care provider or pharmacist if you have questions. ?COMMON BRAND NAME(S): Nplate ?What should I tell my care team before I take this medication? ?They need to know if you have any of these conditions: ?blood clots ?myelodysplastic syndrome ?an unusual or allergic reaction to romiplostim, mannitol, other medicines, foods, dyes, or preservatives ?pregnant or trying to get pregnant ?breast-feeding ?How should I use this medication? ?This medicine is injected under the skin. It is given by a health care provider in a hospital or clinic setting. ?A special MedGuide will be given to you before each treatment. Be sure to read this information carefully each time. ?Talk to your health care provider about the use of this medicine in children. While it may be prescribed for children as young as newborns for selected conditions, precautions do apply. ?Overdosage: If you think you have taken too much of this medicine contact a poison control center or emergency room at once. ?NOTE: This medicine is only for you. Do not share this medicine with others. ?What if I miss a dose? ?Keep appointments for follow-up doses. It is important not to miss your dose. Call your health care provider if you are unable to keep an appointment. ?What may interact with this medication? ?Interactions are not expected. ?This list may not describe all possible interactions. Give your health care provider a list of all the medicines, herbs, non-prescription drugs, or dietary supplements you use. Also tell them if you smoke, drink alcohol, or use illegal drugs. Some items may interact with your medicine. ?What should I  watch for while using this medication? ?Visit your health care provider for regular checks on your progress. You may need blood work done while you are taking this medicine. Your condition will be monitored carefully while you are receiving this medicine. It is important not to miss any appointments. ?What side effects may I notice from receiving this medication? ?Side effects that you should report to your doctor or health care professional as soon as possible: ?allergic reactions (skin rash, itching or hives; swelling of the face, lips, or tongue) ?bleeding (bloody or black, tarry stools; red or dark brown urine; spitting up blood or brown material that looks like coffee grounds; red spots on the skin; unusual bruising or bleeding from the eyes, gums, or nose) ?blood clot (chest pain; shortness of breath; pain, swelling, or warmth in the leg) ?stroke (changes in vision; confusion; trouble speaking or understanding; severe headaches; sudden numbness or weakness of the face, arm or leg; trouble walking; dizziness; loss of balance or coordination) ?Side effects that usually do not require medical attention (report to your doctor or health care professional if they continue or are bothersome): ?diarrhea ?dizziness ?headache ?joint pain ?muscle pain ?stomach pain ?trouble sleeping ?This list may not describe all possible side effects. Call your doctor for medical advice about side effects. You may report side effects to FDA at 1-800-FDA-1088. ?Where should I keep my medication? ?This medicine is given in a hospital or clinic. It will not be stored at home. ?NOTE: This sheet is a summary. It may not cover all possible information. If you have questions about this medicine, talk to your doctor, pharmacist, or health care provider. ??   2022 Elsevier/Gold Standard (2021-05-15 00:00:00) ? ?

## 2021-11-21 ENCOUNTER — Inpatient Hospital Stay: Payer: Commercial Managed Care - HMO

## 2021-11-22 ENCOUNTER — Other Ambulatory Visit (HOSPITAL_COMMUNITY): Payer: Self-pay

## 2021-11-22 MED ORDER — CLONAZEPAM 0.5 MG PO TABS
ORAL_TABLET | ORAL | 2 refills | Status: DC
  Filled 2021-11-22: qty 60, 30d supply, fill #0

## 2021-11-22 MED ORDER — ARIPIPRAZOLE 10 MG PO TABS
ORAL_TABLET | ORAL | 2 refills | Status: DC
  Filled 2021-11-22 – 2022-01-30 (×2): qty 30, 30d supply, fill #0
  Filled 2022-03-21: qty 30, 30d supply, fill #1
  Filled 2022-05-16: qty 30, 30d supply, fill #2

## 2021-11-22 MED ORDER — FLUOXETINE HCL 40 MG PO CAPS
40.0000 mg | ORAL_CAPSULE | Freq: Every day | ORAL | 2 refills | Status: DC
  Filled 2022-06-21: qty 30, 30d supply, fill #0

## 2021-11-26 ENCOUNTER — Other Ambulatory Visit: Payer: Self-pay

## 2021-11-26 ENCOUNTER — Inpatient Hospital Stay: Payer: Commercial Managed Care - HMO

## 2021-11-26 DIAGNOSIS — D696 Thrombocytopenia, unspecified: Secondary | ICD-10-CM

## 2021-11-26 LAB — CBC: RBC: 3.21 — AB (ref 3.87–5.11)

## 2021-11-26 LAB — CBC AND DIFFERENTIAL
HCT: 27 — AB (ref 36–46)
Hemoglobin: 8.4 — AB (ref 12.0–16.0)
Neutrophils Absolute: 0.42
Platelets: 12 10*3/uL — AB (ref 150–400)
WBC: 1.2

## 2021-11-26 NOTE — Progress Notes (Signed)
PLATELET COUNT NOTED TO BE GIVEN TO [place] LPN ?

## 2021-11-27 ENCOUNTER — Other Ambulatory Visit: Payer: Self-pay | Admitting: Pharmacist

## 2021-11-27 NOTE — Progress Notes (Signed)
Spoke with [employer] transportation and scheduled patient for 03/27, 03/29, 04/03, and 04/05. ?

## 2021-11-28 ENCOUNTER — Inpatient Hospital Stay: Payer: Commercial Managed Care - HMO

## 2021-11-28 ENCOUNTER — Other Ambulatory Visit: Payer: Self-pay

## 2021-11-28 VITALS — BP 103/65 | HR 106 | Resp 18 | Ht 61.42 in | Wt 145.0 lb

## 2021-11-28 DIAGNOSIS — D693 Immune thrombocytopenic purpura: Secondary | ICD-10-CM | POA: Diagnosis not present

## 2021-11-28 DIAGNOSIS — D696 Thrombocytopenia, unspecified: Secondary | ICD-10-CM

## 2021-11-28 MED ORDER — ROMIPLOSTIM 250 MCG ~~LOC~~ SOLR
3.8300 ug/kg | Freq: Once | SUBCUTANEOUS | Status: AC
  Administered 2021-11-28: 250 ug via SUBCUTANEOUS
  Filled 2021-11-28: qty 0.5

## 2021-11-28 MED FILL — Romiplostim For Inj 250 MCG: SUBCUTANEOUS | Qty: 0.5 | Status: AC

## 2021-11-28 NOTE — Patient Instructions (Signed)
Romiplostim injection ?What is this medication? ?ROMIPLOSTIM (roe mi PLOE stim) helps your body make more platelets. This medicine is used to treat low platelets caused by chronic idiopathic thrombocytopenic purpura (ITP) or a bone marrow syndrome caused by radiation sickness. ?This medicine may be used for other purposes; ask your health care provider or pharmacist if you have questions. ?COMMON BRAND NAME(S): Nplate ?What should I tell my care team before I take this medication? ?They need to know if you have any of these conditions: ?blood clots ?myelodysplastic syndrome ?an unusual or allergic reaction to romiplostim, mannitol, other medicines, foods, dyes, or preservatives ?pregnant or trying to get pregnant ?breast-feeding ?How should I use this medication? ?This medicine is injected under the skin. It is given by a health care provider in a hospital or clinic setting. ?A special MedGuide will be given to you before each treatment. Be sure to read this information carefully each time. ?Talk to your health care provider about the use of this medicine in children. While it may be prescribed for children as young as newborns for selected conditions, precautions do apply. ?Overdosage: If you think you have taken too much of this medicine contact a poison control center or emergency room at once. ?NOTE: This medicine is only for you. Do not share this medicine with others. ?What if I miss a dose? ?Keep appointments for follow-up doses. It is important not to miss your dose. Call your health care provider if you are unable to keep an appointment. ?What may interact with this medication? ?Interactions are not expected. ?This list may not describe all possible interactions. Give your health care provider a list of all the medicines, herbs, non-prescription drugs, or dietary supplements you use. Also tell them if you smoke, drink alcohol, or use illegal drugs. Some items may interact with your medicine. ?What should I  watch for while using this medication? ?Visit your health care provider for regular checks on your progress. You may need blood work done while you are taking this medicine. Your condition will be monitored carefully while you are receiving this medicine. It is important not to miss any appointments. ?What side effects may I notice from receiving this medication? ?Side effects that you should report to your doctor or health care professional as soon as possible: ?allergic reactions (skin rash, itching or hives; swelling of the face, lips, or tongue) ?bleeding (bloody or black, tarry stools; red or dark brown urine; spitting up blood or brown material that looks like coffee grounds; red spots on the skin; unusual bruising or bleeding from the eyes, gums, or nose) ?blood clot (chest pain; shortness of breath; pain, swelling, or warmth in the leg) ?stroke (changes in vision; confusion; trouble speaking or understanding; severe headaches; sudden numbness or weakness of the face, arm or leg; trouble walking; dizziness; loss of balance or coordination) ?Side effects that usually do not require medical attention (report to your doctor or health care professional if they continue or are bothersome): ?diarrhea ?dizziness ?headache ?joint pain ?muscle pain ?stomach pain ?trouble sleeping ?This list may not describe all possible side effects. Call your doctor for medical advice about side effects. You may report side effects to FDA at 1-800-FDA-1088. ?Where should I keep my medication? ?This medicine is given in a hospital or clinic. It will not be stored at home. ?NOTE: This sheet is a summary. It may not cover all possible information. If you have questions about this medicine, talk to your doctor, pharmacist, or health care provider. ??   2022 Elsevier/Gold Standard (2021-05-15 00:00:00) ? ?

## 2021-12-03 ENCOUNTER — Inpatient Hospital Stay: Payer: Commercial Managed Care - HMO

## 2021-12-04 ENCOUNTER — Other Ambulatory Visit: Payer: Self-pay | Admitting: Hematology and Oncology

## 2021-12-04 DIAGNOSIS — D696 Thrombocytopenia, unspecified: Secondary | ICD-10-CM

## 2021-12-05 ENCOUNTER — Ambulatory Visit: Payer: Medicaid Other

## 2021-12-07 ENCOUNTER — Other Ambulatory Visit (HOSPITAL_COMMUNITY): Payer: Self-pay

## 2021-12-10 ENCOUNTER — Encounter: Payer: Self-pay | Admitting: Oncology

## 2021-12-10 ENCOUNTER — Other Ambulatory Visit: Payer: Medicaid Other

## 2021-12-10 ENCOUNTER — Other Ambulatory Visit (HOSPITAL_COMMUNITY): Payer: Self-pay

## 2021-12-10 MED ORDER — FUROSEMIDE 40 MG PO TABS
ORAL_TABLET | ORAL | 0 refills | Status: DC
  Filled 2021-12-10: qty 90, 90d supply, fill #0

## 2021-12-10 MED ORDER — PANTOPRAZOLE SODIUM 40 MG PO TBEC
DELAYED_RELEASE_TABLET | ORAL | 0 refills | Status: DC
  Filled 2021-12-10: qty 180, 90d supply, fill #0

## 2021-12-10 MED ORDER — LEVETIRACETAM 750 MG PO TABS
ORAL_TABLET | ORAL | 0 refills | Status: DC
Start: 2021-12-10 — End: 2021-12-15
  Filled 2021-12-10: qty 180, 90d supply, fill #0

## 2021-12-10 MED ORDER — NADOLOL 20 MG PO TABS
ORAL_TABLET | ORAL | 0 refills | Status: DC
Start: 2021-12-10 — End: 2022-02-02
  Filled 2021-12-10: qty 90, 90d supply, fill #0

## 2021-12-10 MED ORDER — FLUOXETINE HCL 40 MG PO CAPS
ORAL_CAPSULE | ORAL | 0 refills | Status: DC
Start: 2021-12-10 — End: 2021-12-15
  Filled 2021-12-10: qty 90, 90d supply, fill #0

## 2021-12-10 MED ORDER — ARIPIPRAZOLE 10 MG PO TABS
ORAL_TABLET | ORAL | 0 refills | Status: DC
  Filled 2021-12-10: qty 30, 30d supply, fill #0

## 2021-12-10 MED ORDER — MULTIVITAMIN ADULT (MINERALS) PO TABS
ORAL_TABLET | ORAL | 0 refills | Status: DC
Start: 2021-12-10 — End: 2021-12-15

## 2021-12-11 ENCOUNTER — Other Ambulatory Visit: Payer: Self-pay | Admitting: Pharmacist

## 2021-12-11 ENCOUNTER — Other Ambulatory Visit (HOSPITAL_COMMUNITY): Payer: Self-pay

## 2021-12-11 NOTE — Progress Notes (Signed)
Spoke with [employer] and schedule transportation for DOS 04/11, 04/12, 04/17, and 04/19. ?

## 2021-12-12 ENCOUNTER — Other Ambulatory Visit (HOSPITAL_COMMUNITY): Payer: Self-pay

## 2021-12-12 ENCOUNTER — Encounter: Payer: Self-pay | Admitting: Oncology

## 2021-12-12 ENCOUNTER — Ambulatory Visit: Payer: Self-pay

## 2021-12-15 ENCOUNTER — Encounter: Payer: Self-pay | Admitting: Oncology

## 2021-12-15 ENCOUNTER — Observation Stay (HOSPITAL_COMMUNITY)
Admission: EM | Admit: 2021-12-15 | Discharge: 2021-12-16 | Disposition: A | Discharge status: Home / Self Care | Payer: Commercial Managed Care - HMO | Attending: Family Medicine | Admitting: Family Medicine

## 2021-12-15 ENCOUNTER — Emergency Department (HOSPITAL_COMMUNITY): Payer: Commercial Managed Care - HMO

## 2021-12-15 ENCOUNTER — Other Ambulatory Visit: Payer: Self-pay

## 2021-12-15 ENCOUNTER — Encounter (HOSPITAL_COMMUNITY): Payer: Self-pay

## 2021-12-15 ENCOUNTER — Observation Stay (HOSPITAL_COMMUNITY): Payer: Commercial Managed Care - HMO

## 2021-12-15 DIAGNOSIS — D709 Neutropenia, unspecified: Secondary | ICD-10-CM | POA: Diagnosis not present

## 2021-12-15 DIAGNOSIS — R569 Unspecified convulsions: Secondary | ICD-10-CM

## 2021-12-15 DIAGNOSIS — L03115 Cellulitis of right lower limb: Secondary | ICD-10-CM

## 2021-12-15 DIAGNOSIS — Z8669 Personal history of other diseases of the nervous system and sense organs: Secondary | ICD-10-CM | POA: Insufficient documentation

## 2021-12-15 DIAGNOSIS — Z79899 Other long term (current) drug therapy: Secondary | ICD-10-CM | POA: Diagnosis not present

## 2021-12-15 DIAGNOSIS — K219 Gastro-esophageal reflux disease without esophagitis: Secondary | ICD-10-CM | POA: Diagnosis present

## 2021-12-15 DIAGNOSIS — K7031 Alcoholic cirrhosis of liver with ascites: Secondary | ICD-10-CM | POA: Insufficient documentation

## 2021-12-15 DIAGNOSIS — E441 Mild protein-calorie malnutrition: Secondary | ICD-10-CM | POA: Diagnosis present

## 2021-12-15 DIAGNOSIS — Y905 Blood alcohol level of 100-119 mg/100 ml: Secondary | ICD-10-CM | POA: Insufficient documentation

## 2021-12-15 DIAGNOSIS — F10929 Alcohol use, unspecified with intoxication, unspecified: Secondary | ICD-10-CM | POA: Insufficient documentation

## 2021-12-15 DIAGNOSIS — E876 Hypokalemia: Secondary | ICD-10-CM | POA: Diagnosis not present

## 2021-12-15 DIAGNOSIS — R109 Unspecified abdominal pain: Secondary | ICD-10-CM | POA: Diagnosis present

## 2021-12-15 DIAGNOSIS — L0390 Cellulitis, unspecified: Secondary | ICD-10-CM

## 2021-12-15 DIAGNOSIS — D61818 Other pancytopenia: Secondary | ICD-10-CM | POA: Diagnosis present

## 2021-12-15 DIAGNOSIS — F1020 Alcohol dependence, uncomplicated: Secondary | ICD-10-CM | POA: Diagnosis present

## 2021-12-15 DIAGNOSIS — G40909 Epilepsy, unspecified, not intractable, without status epilepticus: Secondary | ICD-10-CM

## 2021-12-15 DIAGNOSIS — E43 Unspecified severe protein-calorie malnutrition: Secondary | ICD-10-CM | POA: Diagnosis present

## 2021-12-15 LAB — MAGNESIUM: Magnesium: 1.8 mg/dL (ref 1.7–2.4)

## 2021-12-15 LAB — BODY FLUID CELL COUNT WITH DIFFERENTIAL
Eos, Fluid: 0 %
Lymphs, Fluid: 13 %
Monocyte-Macrophage-Serous Fluid: 84 % (ref 50–90)
Neutrophil Count, Fluid: 3 % (ref 0–25)
Total Nucleated Cell Count, Fluid: 53 cu mm (ref 0–1000)

## 2021-12-15 LAB — URINALYSIS, ROUTINE W REFLEX MICROSCOPIC
Bacteria, UA: NONE SEEN
Bilirubin Urine: NEGATIVE
Glucose, UA: NEGATIVE mg/dL
Hgb urine dipstick: NEGATIVE
Ketones, ur: NEGATIVE mg/dL
Nitrite: NEGATIVE
Protein, ur: NEGATIVE mg/dL
Specific Gravity, Urine: 1.006 (ref 1.005–1.030)
pH: 9 — ABNORMAL HIGH (ref 5.0–8.0)

## 2021-12-15 LAB — COMPREHENSIVE METABOLIC PANEL
ALT: 18 U/L (ref 0–44)
AST: 50 U/L — ABNORMAL HIGH (ref 15–41)
Albumin: 3 g/dL — ABNORMAL LOW (ref 3.5–5.0)
Alkaline Phosphatase: 143 U/L — ABNORMAL HIGH (ref 38–126)
Anion gap: 9 (ref 5–15)
BUN: 8 mg/dL (ref 6–20)
CO2: 29 mmol/L (ref 22–32)
Calcium: 7.8 mg/dL — ABNORMAL LOW (ref 8.9–10.3)
Chloride: 99 mmol/L (ref 98–111)
Creatinine, Ser: 0.51 mg/dL (ref 0.44–1.00)
GFR, Estimated: >60 mL/min (ref >60)
Glucose, Bld: 93 mg/dL (ref 70–99)
Potassium: 2.4 mmol/L — CL (ref 3.5–5.1)
Sodium: 137 mmol/L (ref 135–145)
Total Bilirubin: 2 mg/dL — ABNORMAL HIGH (ref 0.3–1.2)
Total Protein: 6.7 g/dL (ref 6.5–8.1)

## 2021-12-15 LAB — PROTIME-INR
INR: 1.5 — ABNORMAL HIGH (ref 0.8–1.2)
Prothrombin Time: 18.4 seconds — ABNORMAL HIGH (ref 11.4–15.2)

## 2021-12-15 LAB — LIPASE, BLOOD: Lipase: 37 U/L (ref 11–51)

## 2021-12-15 LAB — CBC WITH DIFFERENTIAL/PLATELET
Abs Immature Granulocytes: 0 10*3/uL (ref 0.00–0.07)
Basophils Absolute: 0 10*3/uL (ref 0.0–0.1)
Basophils Relative: 1 %
Eosinophils Absolute: 0 10*3/uL (ref 0.0–0.5)
Eosinophils Relative: 3 %
HCT: 25.5 % — ABNORMAL LOW (ref 36.0–46.0)
Hemoglobin: 7.8 g/dL — ABNORMAL LOW (ref 12.0–15.0)
Immature Granulocytes: 0 %
Lymphocytes Relative: 36 %
Lymphs Abs: 0.6 10*3/uL — ABNORMAL LOW (ref 0.7–4.0)
MCH: 25.2 pg — ABNORMAL LOW (ref 26.0–34.0)
MCHC: 30.6 g/dL (ref 30.0–36.0)
MCV: 82.5 fL (ref 80.0–100.0)
Monocytes Absolute: 0.5 10*3/uL (ref 0.1–1.0)
Monocytes Relative: 29 %
Neutro Abs: 0.5 10*3/uL — ABNORMAL LOW (ref 1.7–7.7)
Neutrophils Relative %: 31 %
Platelets: 82 10*3/uL — ABNORMAL LOW (ref 150–400)
RBC: 3.09 MIL/uL — ABNORMAL LOW (ref 3.87–5.11)
RDW: 19.1 % — ABNORMAL HIGH (ref 11.5–15.5)
WBC: 1.6 10*3/uL — ABNORMAL LOW (ref 4.0–10.5)
nRBC: 0 % (ref 0.0–0.2)

## 2021-12-15 LAB — ETHANOL: Alcohol, Ethyl (B): 105 mg/dL — ABNORMAL HIGH (ref <10)

## 2021-12-15 LAB — I-STAT BETA HCG BLOOD, ED (MC, WL, AP ONLY): I-stat hCG, quantitative: <5 m[IU]/mL (ref <5)

## 2021-12-15 MED ORDER — MAGNESIUM SULFATE 2 GM/50ML IV SOLN
2.0000 g | Freq: Once | INTRAVENOUS | Status: AC
  Administered 2021-12-15: 2 g via INTRAVENOUS
  Filled 2021-12-15: qty 50

## 2021-12-15 MED ORDER — THIAMINE HCL 100 MG/ML IJ SOLN: 100.0000 mg | Freq: Every day | INTRAMUSCULAR | Status: DC

## 2021-12-15 MED ORDER — THIAMINE HCL 100 MG PO TABS
100.0000 mg | ORAL_TABLET | Freq: Every day | ORAL | Status: DC
  Administered 2021-12-15 – 2021-12-16 (×2): 100 mg via ORAL
  Filled 2021-12-15 (×2): qty 1

## 2021-12-15 MED ORDER — PANTOPRAZOLE SODIUM 40 MG PO TBEC
40.0000 mg | DELAYED_RELEASE_TABLET | Freq: Every morning | ORAL | Status: DC
  Administered 2021-12-15 – 2021-12-16 (×2): 40 mg via ORAL
  Filled 2021-12-15 (×2): qty 1

## 2021-12-15 MED ORDER — POTASSIUM CHLORIDE CRYS ER 20 MEQ PO TBCR
20.0000 meq | EXTENDED_RELEASE_TABLET | Freq: Every day | ORAL | Status: DC
  Administered 2021-12-16: 20 meq via ORAL
  Filled 2021-12-15: qty 1

## 2021-12-15 MED ORDER — LORAZEPAM 2 MG/ML IJ SOLN: 0.0000 mg | INTRAMUSCULAR | Status: DC

## 2021-12-15 MED ORDER — POTASSIUM CHLORIDE CRYS ER 20 MEQ PO TBCR
40.0000 meq | EXTENDED_RELEASE_TABLET | ORAL | Status: AC
  Administered 2021-12-15 (×2): 40 meq via ORAL
  Filled 2021-12-15 (×2): qty 2

## 2021-12-15 MED ORDER — THIAMINE HCL 100 MG/ML IJ SOLN
100.0000 mg | Freq: Once | INTRAMUSCULAR | Status: AC
  Administered 2021-12-15: 100 mg via INTRAVENOUS
  Filled 2021-12-15: qty 2

## 2021-12-15 MED ORDER — ALBUMIN HUMAN 25 % IV SOLN
50.0000 g | Freq: Once | INTRAVENOUS | Status: DC | PRN
Start: 2021-12-15 — End: 2021-12-16

## 2021-12-15 MED ORDER — FOLIC ACID 1 MG PO TABS
1.0000 mg | ORAL_TABLET | Freq: Every day | ORAL | Status: DC
  Administered 2021-12-15 – 2021-12-16 (×2): 1 mg via ORAL
  Filled 2021-12-15 (×2): qty 1

## 2021-12-15 MED ORDER — POTASSIUM CHLORIDE 10 MEQ/100ML IV SOLN
10.0000 meq | INTRAVENOUS | Status: AC
  Administered 2021-12-15 (×3): 10 meq via INTRAVENOUS
  Filled 2021-12-15 (×3): qty 100

## 2021-12-15 MED ORDER — NALTREXONE HCL 50 MG PO TABS
50.0000 mg | ORAL_TABLET | Freq: Every morning | ORAL | Status: DC
  Administered 2021-12-15 – 2021-12-16 (×2): 50 mg via ORAL
  Filled 2021-12-15 (×2): qty 1

## 2021-12-15 MED ORDER — LEVETIRACETAM 500 MG PO TABS
750.0000 mg | ORAL_TABLET | Freq: Two times a day (BID) | ORAL | Status: DC
  Administered 2021-12-15 – 2021-12-16 (×3): 750 mg via ORAL
  Filled 2021-12-15 (×5): qty 1

## 2021-12-15 MED ORDER — LORAZEPAM 1 MG PO TABS
0.0000 mg | ORAL_TABLET | Freq: Four times a day (QID) | ORAL | Status: DC
  Administered 2021-12-15 – 2021-12-16 (×5): 1 mg via ORAL
  Filled 2021-12-15 (×5): qty 1

## 2021-12-15 MED ORDER — LORAZEPAM 2 MG/ML IJ SOLN: 0.0000 mg | Freq: Three times a day (TID) | INTRAMUSCULAR | Status: DC

## 2021-12-15 MED ORDER — NADOLOL 20 MG PO TABS
20.0000 mg | ORAL_TABLET | Freq: Every morning | ORAL | Status: DC
Start: 2021-12-15 — End: 2021-12-16
  Administered 2021-12-15 – 2021-12-16 (×2): 20 mg via ORAL
  Filled 2021-12-15 (×2): qty 1

## 2021-12-15 MED ORDER — ARIPIPRAZOLE 10 MG PO TABS
10.0000 mg | ORAL_TABLET | Freq: Every morning | ORAL | Status: DC
Start: 2021-12-15 — End: 2021-12-16
  Administered 2021-12-15 – 2021-12-16 (×2): 10 mg via ORAL
  Filled 2021-12-15 (×2): qty 1

## 2021-12-15 MED ORDER — LIDOCAINE HCL 1 % IJ SOLN
INTRAMUSCULAR | Status: AC
  Administered 2021-12-15: 10 mL
  Filled 2021-12-15: qty 20

## 2021-12-15 MED ORDER — LORAZEPAM 2 MG/ML IJ SOLN: 1.0000 mg | INTRAMUSCULAR | Status: DC | PRN

## 2021-12-15 MED ORDER — LORAZEPAM 1 MG PO TABS: 0.0000 mg | ORAL_TABLET | Freq: Two times a day (BID) | ORAL | Status: DC

## 2021-12-15 MED ORDER — CLONAZEPAM 0.5 MG PO TABS: 0.5000 mg | ORAL_TABLET | Freq: Two times a day (BID) | ORAL | Status: DC | PRN

## 2021-12-15 MED ORDER — GABAPENTIN 300 MG PO CAPS
300.0000 mg | ORAL_CAPSULE | Freq: Three times a day (TID) | ORAL | Status: DC
  Administered 2021-12-15 – 2021-12-16 (×4): 300 mg via ORAL
  Filled 2021-12-15 (×4): qty 1

## 2021-12-15 MED ORDER — FLUOXETINE HCL 20 MG PO CAPS
40.0000 mg | ORAL_CAPSULE | Freq: Every morning | ORAL | Status: DC
  Administered 2021-12-15 – 2021-12-16 (×2): 40 mg via ORAL
  Filled 2021-12-15 (×2): qty 2

## 2021-12-15 MED ORDER — LORAZEPAM 1 MG PO TABS: 1.0000 mg | ORAL_TABLET | ORAL | Status: DC | PRN

## 2021-12-15 MED ORDER — FUROSEMIDE 40 MG PO TABS
40.0000 mg | ORAL_TABLET | Freq: Every morning | ORAL | Status: DC
  Administered 2021-12-16: 40 mg via ORAL
  Filled 2021-12-15: qty 1

## 2021-12-15 MED ORDER — ONDANSETRON HCL 4 MG/2ML IJ SOLN
4.0000 mg | Freq: Four times a day (QID) | INTRAMUSCULAR | Status: DC | PRN
Start: 2021-12-15 — End: 2021-12-16

## 2021-12-15 MED ORDER — ADULT MULTIVITAMIN W/MINERALS CH
1.0000 | ORAL_TABLET | Freq: Every day | ORAL | Status: DC
  Administered 2021-12-16: 1 via ORAL
  Filled 2021-12-15: qty 1

## 2021-12-15 MED ORDER — FUROSEMIDE 40 MG PO TABS
40.0000 mg | ORAL_TABLET | Freq: Once | ORAL | Status: AC
  Administered 2021-12-15: 40 mg via ORAL
  Filled 2021-12-15: qty 1

## 2021-12-15 MED ORDER — LACTULOSE 10 GM/15ML PO SOLN
20.0000 g | Freq: Three times a day (TID) | ORAL | Status: DC
  Administered 2021-12-15 – 2021-12-16 (×4): 20 g via ORAL
  Filled 2021-12-15 (×4): qty 30

## 2021-12-15 MED ORDER — ONDANSETRON HCL 4 MG PO TABS
4.0000 mg | ORAL_TABLET | Freq: Four times a day (QID) | ORAL | Status: DC | PRN
  Administered 2021-12-15: 4 mg via ORAL
  Filled 2021-12-15: qty 1

## 2021-12-15 MED ORDER — SPIRONOLACTONE 100 MG PO TABS
100.0000 mg | ORAL_TABLET | Freq: Every day | ORAL | Status: DC
  Administered 2021-12-15 – 2021-12-16 (×2): 100 mg via ORAL
  Filled 2021-12-15 (×2): qty 1

## 2021-12-15 NOTE — ED Notes (Signed)
Pt aware urine sample needed. Unable to provide one at this time. 

## 2021-12-15 NOTE — ED Notes (Signed)
Asked question, waiting for response ?

## 2021-12-15 NOTE — H&P (Addendum)
?History and Physical  ? ? ?Patient: Briana Sweeney NOB:[id number] DOB: 1971-07-26 ?DOA: 12/15/2021 ?DOS: the patient was seen and examined on 12/15/2021 ?PCP: Marco Collie, MD  ?Patient coming from: Home ? ?Chief Complaint:  ?Chief Complaint  ?Patient presents with  ? Abdominal Pain  ? ?HPI: Briana Sweeney is a 51 y.o. female with below past medical history significant for alcoholic liver cirrhosis who came into the emergency department with complaints of of abdominal distention abdominal pain due to ascites.  She stated that she had a paracentesis done last Friday at another facility with about 5 L of peritoneal fluid removed.  She denied fever, chills, night sweats, nausea, vomiting or constipation.  She gets occasional loose stools with lactulose use.  She has felt mildly dyspneic at times, but denied chest pain, palpitations, dizziness, diaphoresis or PND.  She gets frequent lower extremity edema.  Denied flank pain, dysuria, frequency or hematuria.  No polyuria, polydipsia, polyphagia or blurred vision.  She has not been taking her spironolactone for a while since she ran out.  She looks that this was enough to balance her potassium level as she was not on potassium supplementation. ?  ?ED course: Initial vital signs were temperature 98 ?F, pulse 92, respiration 18, BP 109/74 mmHg O2 sat 100% on room air.  The patient received thiamine 100 mg IV, magnesium sulfate 2 g IVPB and 3 doses of KCl 10 mEq IVPB. ? ?Lab work: Her CBC is her white count of 1.6, hemoglobin stable at 9.8 g/dL and platelets 82.  PT was 18.4 and INR 1.5.  Alcohol level was 105 mg/dL.  Lipase was normal.  CMP showed potassium of 2.4 mmol/L.  Corrected calcium is about 8.8 mg/dL.  Renal function of the rest of the electrolytes are normal.  LFTs showed an albumin level of 3.0 g/dL, AST of 50, ALT of 18, alkaline phosphatase 143 units/L.  Total bilirubin was 2.0 g/dL. ? ?Imaging: A 2 view chest radiograph study showed no evidence of acute  disease.  She had an ultrasound-guided paracentesis that yielded about 2.3 L of peritoneal fluid. ? ?Review of Systems: As mentioned in the history of present illness. All other systems reviewed and are negative. ?Past Medical History:  ?Diagnosis Date  ? Abscess of bursa of left elbow 09/23/2018  ? Acute hyperactive alcohol withdrawal delirium ([place]) 10/09/2017  ? Acute low back pain with bilateral sciatica   ? Acute metabolic encephalopathy 94/76/5465  ? Acute on chronic pancreatitis ([place]) 04/04/2021  ? AKI (acute kidney injury) ([place]) 04/04/2021  ? Alcohol withdrawal syndrome with complication ([place]) 03/54/6568  ? Alcohol-induced acute pancreatitis   ? 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 Ascites due to alcoholic cirrhosis (HCC)   ? Bipolar affective disorder ([place])   ? With anxiety features  ? Bunionette of right foot 11/2019  ? Cirrhosis of liver ([place])   ? Due to alcohol and hepatitis C  ? Cocaine dependence without complication ([place]) 12/75/1700  ? Decompensated hepatic cirrhosis ([place]) 07/23/2017  ? Depression with anxiety   ? Epistaxis 01/20/2019  ? Erysipelas 09/13/2018  ? Esophageal 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 (HCC)   ? Esophageal varices without bleeding ([place])   ? [facility]TOH[facility])   ? GERD (gastroesophageal reflux disease)   ? Head injury   ? Hematemesis 02/10/2018  ? Hepatitis C 2018  ? hepatitis c and alcohol related hepatitis  ? Heroin abuse ([place])   ? History of blood transfusion   ? "blood doesn't clot; I fell down  and had to have a transfusion"  ? History of kidney stones   ? Menopause 2016  ? Migraine   ? "when I get really stressed" (09/01/2017)  ? Neuropathy 10/27/2018  ? Olecranon bursitis of left elbow   ? Other mixed anxiety disorders 10/27/2018  ? Overdose 08/22/2018  ? Pancytopenia ([place]) 07/23/2017  ? Polysubstance abuse ([place]) 04/08/2018  ? Portal hypertension ([place]) 03/18/2018  ? S/P foot surgery, right 11/2019  ? Schizophrenia ([place])   ? Seizures ([place])   ? "when I run out of my RX; lots recently"  (09/01/2017)  ? Suicidal ideation   ? Thrombocytopenia ([place])   ? Trichimoniasis 10/30/2017  ? Upper GI bleed 02/10/2018  ? Urinary urgency 05/2020  ? UTI (urinary tract infection) 06/02/2017  ? ?Past Surgical History:  ?Procedure Laterality Date  ? BUNIONECTOMY    ? ESOPHAGEAL BANDING  06/26/2021  ? Procedure: ESOPHAGEAL BANDING;  Surgeon: Jackquline Denmark, MD;  Location[facility] ENDOSCOPY;  Service: Endoscopy;;  ? ESOPHAGOGASTRODUODENOSCOPY N/A 09/03/2017  ? Procedure: ESOPHAGOGASTRODUODENOSCOPY (EGD);  Surgeon: Doran Stabler, MD;  Location: [facility];  Service: Gastroenterology;  Laterality: N/A;  ? ESOPHAGOGASTRODUODENOSCOPY (EGD) WITH PROPOFOL N/A 06/26/2021  ? Procedure: ESOPHAGOGASTRODUODENOSCOPY (EGD) WITH PROPOFOL;  Surgeon: Jackquline Denmark, MD;  Location: [facility] ENDOSCOPY;  Service: Endoscopy;  Laterality: N/A;  ? FINGER FRACTURE SURGERY Left   ? "shattered my pinky"  ? FRACTURE SURGERY    ? I & D EXTREMITY Left 09/18/2018  ? Procedure: IRRIGATION AND DEBRIDEMENT EXTREMITY;  Surgeon: Leanora Cover, MD;  Location: [facility] ORS;  Service: Orthopedics;  Laterality: Left;  ? IR PARACENTESIS  07/23/2017  ? IR PARACENTESIS  07/2017  ? "did it twice in the same week" (09/01/2017)  ? IR PARACENTESIS  06/26/2021  ? SHOULDER OPEN ROTATOR CUFF REPAIR Right   ? TOOTH EXTRACTION  08/2019  ? TUBAL LIGATION    ? VAGINAL HYSTERECTOMY    ? ?Social History:  reports that she has never smoked. She has never used smokeless tobacco. She reports that she does not currently use alcohol after a past usage of about 6.0 standard drinks per week. She reports that she does not currently use drugs. ? ?Allergies  ?Allergen Reactions  ? Other Other (See Comments)  ?  Platelets: Rx chest pain, tremors, body aches  ? ? ?Family History  ?Problem Relation Age of Onset  ? Lung cancer Mother 25  ? Alcohol abuse Mother   ? Throat cancer Father 83  ? ? ?Prior to Admission medications   ?Medication Sig Start Date End Date Taking? Authorizing Provider   ?ARIPiprazole (ABILIFY) 10 MG tablet Take 1 tablet by mouth every day 11/22/21     ?ARIPiprazole (ABILIFY) 10 MG tablet Take 1 tablet by mouth daily 12/10/21     ?clonazePAM (KLONOPIN) 0.5 MG tablet Take 1 tablet by mouth 2 times daily. 08/30/21     ?clonazePAM (KLONOPIN) 0.5 MG tablet Take 1 tablet mouth 2 times daily 11/22/21     ?FLUoxetine (PROZAC) 40 MG capsule Take 1 capsule (40 mg total) by mouth daily. 11/22/21     ?FLUoxetine (PROZAC) 40 MG capsule Take 1 capsule by mouth once daily 12/10/21     ?folic acid (FOLVITE) 1 MG tablet Take 1 tablet (1 mg total) by mouth once daily 10/29/21     ?furosemide (LASIX) 40 MG tablet Take 1 tablet by mouth 2 (two) times daily for 15 days then return to once daily or as directed per Dr. 11/02/21 11/27/21  Kc,  Maren Beach, MD  ?furosemide (LASIX) 40 MG tablet Take 1 tablet by mouth daily 12/10/21     ?lactulose (CHRONULAC) 10 GM/15ML solution Take 30 mLs by mouth 2 (two) times daily as needed for Constipation (take as needed to have goal 2-3 bowel movements daily) for up to 30 days 10/29/21     ?levETIRAcetam (KEPPRA) 750 MG tablet Take 1 tablet (750 mg total) by mouth 2 (two) times daily. 07/06/21   Cameron Sprang, MD  ?levETIRAcetam (KEPPRA) 750 MG tablet Take 1 tablet by mouth twice daily 12/10/21     ?Multiple Vitamins tablet Take 1 tablet by mouth once daily 10/29/21     ?Multiple Vitamins-Minerals (MULTIVITAMIN ADULT, MINERALS,) TABS Take 1 tablet by mouth daily 12/10/21     ?nadolol (CORGARD) 20 MG tablet Take 1 tablet by mouth daily 12/10/21     ?naltrexone (DEPADE) 50 MG tablet Take 1 tablet (50 mg total) by mouth daily. 06/29/21   Alcus Dad, MD  ?pantoprazole (PROTONIX) 40 MG tablet Take 1 tablet (40 mg total) by mouth 2 (two) times daily before meals 10/29/21     ?pantoprazole (PROTONIX) 40 MG tablet Take 1 tablet by mouth twice daily 12/10/21     ?spironolactone (ALDACTONE) 100 MG tablet Take 1 tablet (100 mg total) by mouth daily. ?Patient not taking: Reported on 11/01/2021  06/30/21   Alcus Dad, MD  ? ? ?Physical Exam: ?Vitals:  ? 12/15/21 0830 12/15/21 0900 12/15/21 0930 12/15/21 1030  ?BP: 108/77 108/76 104/78 112/85  ?Pulse: 81 79 84 83  ?Resp: '18 18 15 17  '$ ?Temp:      ?TempSrc:

## 2021-12-15 NOTE — ED Notes (Signed)
1 lt Briana Sweeney, 1 lavender, 1 blue, x 2 gold tubes sent to lab for hold.  ?

## 2021-12-15 NOTE — ED Triage Notes (Signed)
Pt BIB EMS, had paracentesis last Friday, 5 L drained. Pt reports swelling has returned over the last 2 days. Endorses abdominal pain  ? ?EMS vitals: ?BP 106/72 ?HR 88 ?SPO2 99% RA ? ?

## 2021-12-15 NOTE — Plan of Care (Signed)
Pt stable on arrival for floor from ed. No needs at time of arrival to floor. Assessment and Vs performed. Will continue to monitor.  ?

## 2021-12-15 NOTE — ED Provider Notes (Signed)
?[facility] DEPT ?Provider Note ? ? ?CSN: [id number] ?Arrival date & time: 12/15/21  6160 ? ?  ? ?History ? ?Chief Complaint  ?Patient presents with  ? Abdominal Pain  ? ? ?Briana Sweeney is a 51 y.o. female. ? ? ?Abdominal Pain ?Associated symptoms: no fever   ? ?Patient presents to the ED with complaints of abdominal swelling and discomfort.  Patient has history of alcoholism, heroin abuse, cirrhosis, esophageal varices, bipolar disorder, ascites, seizures.  Patient states she had a paracentesis done last Friday at another medical institution.  She had several liters of fluid removed.  Patient states since that time she has had increasing fluid retention.  Patient feels like it is pressuring upwards.  She does not have any nausea or vomiting.  She is not having any fevers.  She denies any chest pain.  Patient is scheduled to see a GI specialist but has not seen one yet.  Patient states she does continue to drink alcohol.  Outpatient records reviewed, patient has been followed at the oncology clinic in [place].  She does have history of thrombocytopenia. ?Home Medications ?Prior to Admission medications   ?Medication Sig Start Date End Date Taking? Authorizing Provider  ?ARIPiprazole (ABILIFY) 10 MG tablet Take 1 tablet by mouth every day 11/22/21     ?ARIPiprazole (ABILIFY) 10 MG tablet Take 1 tablet by mouth daily 12/10/21     ?clonazePAM (KLONOPIN) 0.5 MG tablet Take 1 tablet by mouth 2 times daily. 08/30/21     ?clonazePAM (KLONOPIN) 0.5 MG tablet Take 1 tablet mouth 2 times daily 11/22/21     ?FLUoxetine (PROZAC) 40 MG capsule Take 1 capsule (40 mg total) by mouth daily. 11/22/21     ?FLUoxetine (PROZAC) 40 MG capsule Take 1 capsule by mouth once daily 12/10/21     ?folic acid (FOLVITE) 1 MG tablet Take 1 tablet (1 mg total) by mouth once daily 10/29/21     ?furosemide (LASIX) 40 MG tablet Take 1 tablet by mouth 2 (two) times daily for 15 days then return to once daily or as directed  per Dr. 11/02/21 11/27/21  Antonieta Pert, MD  ?furosemide (LASIX) 40 MG tablet Take 1 tablet by mouth daily 12/10/21     ?lactulose (CHRONULAC) 10 GM/15ML solution Take 30 mLs by mouth 2 (two) times daily as needed for Constipation (take as needed to have goal 2-3 bowel movements daily) for up to 30 days 10/29/21     ?levETIRAcetam (KEPPRA) 750 MG tablet Take 1 tablet (750 mg total) by mouth 2 (two) times daily. 07/06/21   Cameron Sprang, MD  ?levETIRAcetam (KEPPRA) 750 MG tablet Take 1 tablet by mouth twice daily 12/10/21     ?Multiple Vitamins tablet Take 1 tablet by mouth once daily 10/29/21     ?Multiple Vitamins-Minerals (MULTIVITAMIN ADULT, MINERALS,) TABS Take 1 tablet by mouth daily 12/10/21     ?nadolol (CORGARD) 20 MG tablet Take 1 tablet by mouth daily 12/10/21     ?naltrexone (DEPADE) 50 MG tablet Take 1 tablet (50 mg total) by mouth daily. 06/29/21   Alcus Dad, MD  ?pantoprazole (PROTONIX) 40 MG tablet Take 1 tablet (40 mg total) by mouth 2 (two) times daily before meals 10/29/21     ?pantoprazole (PROTONIX) 40 MG tablet Take 1 tablet by mouth twice daily 12/10/21     ?spironolactone (ALDACTONE) 100 MG tablet Take 1 tablet (100 mg total) by mouth daily. ?Patient not taking: Reported on 11/01/2021 06/30/21   Rock Nephew,  Hollie Beach, MD  ?   ? ?Allergies    ?Other   ? ?Review of Systems   ?Review of Systems  ?Constitutional:  Negative for fever.  ?Gastrointestinal:  Positive for abdominal pain.  ? ?Physical Exam ?Updated Vital Signs ?BP 104/78   Pulse 84   Temp 98 ?F (36.7 ?C) (Oral)   Resp 15   Ht 1.56 m (5' 1.42")   Wt 66 kg   SpO2 93%   BMI 27.12 kg/m?  ?Physical Exam ?Vitals and nursing note reviewed.  ?Constitutional:   ?   General: She is not in acute distress. ?   Appearance: She is well-developed.  ?HENT:  ?   Head: Normocephalic and atraumatic.  ?   Right Ear: External ear normal.  ?   Left Ear: External ear normal.  ?Eyes:  ?   General: No scleral icterus.    ?   Right eye: No discharge.     ?   Left eye: No  discharge.  ?   Conjunctiva/sclera: Conjunctivae normal.  ?Neck:  ?   Trachea: No tracheal deviation.  ?Cardiovascular:  ?   Rate and Rhythm: Normal rate and regular rhythm.  ?Pulmonary:  ?   Effort: Pulmonary effort is normal. No respiratory distress.  ?   Breath sounds: Normal breath sounds. No stridor. No wheezing or rales.  ?Abdominal:  ?   General: Abdomen is protuberant. Bowel sounds are normal. There is distension.  ?   Palpations: Abdomen is soft. There is fluid wave.  ?   Tenderness: There is no abdominal tenderness. There is no guarding or rebound.  ?   Comments: Abdomen soft  ?Musculoskeletal:     ?   General: No tenderness or deformity.  ?   Cervical back: Neck supple.  ?Skin: ?   General: Skin is warm and dry.  ?   Findings: No rash.  ?Neurological:  ?   General: No focal deficit present.  ?   Mental Status: She is alert.  ?   Cranial Nerves: No cranial nerve deficit (no facial droop, extraocular movements intact, no slurred speech).  ?   Sensory: No sensory deficit.  ?   Motor: No abnormal muscle tone or seizure activity.  ?   Coordination: Coordination normal.  ?Psychiatric:     ?   Mood and Affect: Mood normal.  ? ? ?ED Results / Procedures / Treatments   ?Labs ?(all labs ordered are listed, but only abnormal results are displayed) ?Labs Reviewed  ?CBC WITH DIFFERENTIAL/PLATELET - Abnormal; Notable for the following components:  ?    Result Value  ? WBC 1.6 (*)   ? RBC 3.09 (*)   ? Hemoglobin 7.8 (*)   ? HCT 25.5 (*)   ? MCH 25.2 (*)   ? RDW 19.1 (*)   ? Platelets 82 (*)   ? Neutro Abs 0.5 (*)   ? Lymphs Abs 0.6 (*)   ? All other components within normal limits  ?COMPREHENSIVE METABOLIC PANEL - Abnormal; Notable for the following components:  ? Potassium 2.4 (*)   ? Calcium 7.8 (*)   ? Albumin 3.0 (*)   ? AST 50 (*)   ? Alkaline Phosphatase 143 (*)   ? Total Bilirubin 2.0 (*)   ? All other components within normal limits  ?PROTIME-INR - Abnormal; Notable for the following components:  ? Prothrombin  Time 18.4 (*)   ? INR 1.5 (*)   ? All other components within normal limits  ?ETHANOL -  Abnormal; Notable for the following components:  ? Alcohol, Ethyl (B) 105 (*)   ? All other components within normal limits  ?LIPASE, BLOOD  ?MAGNESIUM  ?URINALYSIS, ROUTINE W REFLEX MICROSCOPIC  ?I-STAT BETA HCG BLOOD, ED ([facility], [facility], [facility] ONLY)  ? ? ?EKG ?None ? ?Radiology ?DG Chest 2 View ? ?Result Date: 12/15/2021 ?CLINICAL DATA:  Abdominal swelling EXAM: CHEST - 2 VIEW COMPARISON:  10/31/2021 FINDINGS: Normal heart size for technique. Chronic elevation of the right diaphragm. Granulomatous calcifications at the left hilum. There is no edema, consolidation, effusion, or pneumothorax. Postoperative right shoulder. IMPRESSION: Stable exam.  No evidence of acute disease. Electronically Signed   By: Jorje Guild M.D.   On: 12/15/2021 07:58   ? ?Procedures ?Procedures  ? ? ?Medications Ordered in ED ?Medications  ?potassium chloride 10 mEq in 100 mL IVPB (10 mEq Intravenous New Bag/Given 12/15/21 1005)  ?albumin human 25 % solution 50 g (has no administration in time range)  ?magnesium sulfate IVPB 2 g 50 mL (0 g Intravenous Stopped 12/15/21 1004)  ?thiamine (B-1) injection 100 mg (100 mg Intravenous Given 12/15/21 0901)  ? ? ?ED Course/ Medical Decision Making/ A&P ?Clinical Course as of 12/15/21 1017  ?Sat Dec 15, 2021  ?0808 Comprehensive metabolic panel(!!) ?Potassium decreased at 2.4. [JK]  ?1610 Ethanol(!) ?Alcohol elevated at 105 [JK]  ?9604 CBC with Differential/Platelet(!) ?Platelets increased from previous.  Anemia and leukopenia stable [JK]  ?[facility] Chest 2 View ?X-ray images and radiology report reviewed.  No evidence of pneumonia pleural effusion [JK]  ?1017 Discussed with Dr Olevia Bowens regarding admission [JK]  ?  ?Clinical Course User Index ?[JK] Dorie Rank, MD  ? ?                        ?Medical Decision Making ?Amount and/or Complexity of Data Reviewed ?External Data Reviewed: notes. ?Labs: ordered. Decision-making details  documented in ED Course. ?Radiology: ordered. Decision-making details documented in ED Course. ? ?Risk ?Prescription drug management. ?Decision regarding hospitalization. ? ? ?Patient presented to the emergency ro

## 2021-12-15 NOTE — Procedures (Signed)
PROCEDURE SUMMARY: ? ?Successful ultrasound guided paracentesis from the left lower abdomen.  ? ?Yielded 2.3 L of hazy yellow fluid.  ?No immediate complications.  ?The patient tolerated the procedure well.  ? ?Specimen was  sent for labs. ? ?EBL < 1 mL ? ?The patient has required >/=2 paracenteses in a 30 day period and a formal evaluation by the [facility] has been arranged. ? ?Patient states that she had paracentesis on Monday 12/10/21 at [facility].  ? ?Tera Mater PA-C ?12/15/2021 11:15 AM ? ? ? ?

## 2021-12-16 DIAGNOSIS — L03115 Cellulitis of right lower limb: Secondary | ICD-10-CM

## 2021-12-16 DIAGNOSIS — E876 Hypokalemia: Secondary | ICD-10-CM | POA: Diagnosis not present

## 2021-12-16 DIAGNOSIS — D709 Neutropenia, unspecified: Secondary | ICD-10-CM

## 2021-12-16 DIAGNOSIS — L0390 Cellulitis, unspecified: Secondary | ICD-10-CM

## 2021-12-16 LAB — COMPREHENSIVE METABOLIC PANEL
ALT: 16 U/L (ref 0–44)
AST: 43 U/L — ABNORMAL HIGH (ref 15–41)
Albumin: 2.8 g/dL — ABNORMAL LOW (ref 3.5–5.0)
Alkaline Phosphatase: 134 U/L — ABNORMAL HIGH (ref 38–126)
Anion gap: 7 (ref 5–15)
BUN: 7 mg/dL (ref 6–20)
CO2: 26 mmol/L (ref 22–32)
Calcium: 8 mg/dL — ABNORMAL LOW (ref 8.9–10.3)
Chloride: 101 mmol/L (ref 98–111)
Creatinine, Ser: 0.53 mg/dL (ref 0.44–1.00)
GFR, Estimated: >60 mL/min (ref >60)
Glucose, Bld: 89 mg/dL (ref 70–99)
Potassium: 3.3 mmol/L — ABNORMAL LOW (ref 3.5–5.1)
Sodium: 134 mmol/L — ABNORMAL LOW (ref 135–145)
Total Bilirubin: 2.3 mg/dL — ABNORMAL HIGH (ref 0.3–1.2)
Total Protein: 6.3 g/dL — ABNORMAL LOW (ref 6.5–8.1)

## 2021-12-16 LAB — DIFFERENTIAL
Abs Immature Granulocytes: 0.01 10*3/uL (ref 0.00–0.07)
Basophils Absolute: 0 10*3/uL (ref 0.0–0.1)
Basophils Relative: 1 %
Eosinophils Absolute: 0.1 10*3/uL (ref 0.0–0.5)
Eosinophils Relative: 3 %
Immature Granulocytes: 1 %
Lymphocytes Relative: 37 %
Lymphs Abs: 0.5 10*3/uL — ABNORMAL LOW (ref 0.7–4.0)
Monocytes Absolute: 0.4 10*3/uL (ref 0.1–1.0)
Monocytes Relative: 26 %
Neutro Abs: 0.5 10*3/uL — ABNORMAL LOW (ref 1.7–7.7)
Neutrophils Relative %: 32 %

## 2021-12-16 LAB — CBC
HCT: 24.1 % — ABNORMAL LOW (ref 36.0–46.0)
Hemoglobin: 7.3 g/dL — ABNORMAL LOW (ref 12.0–15.0)
MCH: 25.2 pg — ABNORMAL LOW (ref 26.0–34.0)
MCHC: 30.3 g/dL (ref 30.0–36.0)
MCV: 83.1 fL (ref 80.0–100.0)
Platelets: 53 10*3/uL — ABNORMAL LOW (ref 150–400)
RBC: 2.9 MIL/uL — ABNORMAL LOW (ref 3.87–5.11)
RDW: 18.8 % — ABNORMAL HIGH (ref 11.5–15.5)
WBC: 1.5 10*3/uL — ABNORMAL LOW (ref 4.0–10.5)
nRBC: 0 % (ref 0.0–0.2)

## 2021-12-16 LAB — AMMONIA: Ammonia: 63 umol/L — ABNORMAL HIGH (ref 9–35)

## 2021-12-16 LAB — HEMOGLOBIN AND HEMATOCRIT, BLOOD
HCT: 26.3 % — ABNORMAL LOW (ref 36.0–46.0)
Hemoglobin: 8 g/dL — ABNORMAL LOW (ref 12.0–15.0)

## 2021-12-16 MED ORDER — POTASSIUM CHLORIDE CRYS ER 20 MEQ PO TBCR
40.0000 meq | EXTENDED_RELEASE_TABLET | ORAL | Status: AC
  Administered 2021-12-16 (×2): 40 meq via ORAL
  Filled 2021-12-16 (×2): qty 2

## 2021-12-16 MED ORDER — SPIRONOLACTONE 25 MG PO TABS
50.0000 mg | ORAL_TABLET | Freq: Once | ORAL | Status: AC
  Administered 2021-12-16: 50 mg via ORAL
  Filled 2021-12-16: qty 2

## 2021-12-16 MED ORDER — FUROSEMIDE 40 MG PO TABS
60.0000 mg | ORAL_TABLET | Freq: Every day | ORAL | 0 refills | Status: DC
  Filled 2021-12-16: qty 45, 30d supply, fill #0

## 2021-12-16 MED ORDER — POTASSIUM CHLORIDE 20 MEQ PO PACK
20.0000 meq | PACK | Freq: Every day | ORAL | 0 refills | Status: DC
  Filled 2021-12-16: qty 14, 14d supply, fill #0

## 2021-12-16 MED ORDER — FUROSEMIDE 40 MG PO TABS: 60.0000 mg | ORAL_TABLET | Freq: Every day | ORAL | 0 refills | Status: DC

## 2021-12-16 MED ORDER — SPIRONOLACTONE 100 MG PO TABS: 150.0000 mg | ORAL_TABLET | Freq: Every day | ORAL | 0 refills | Status: DC

## 2021-12-16 MED ORDER — CEFADROXIL 500 MG PO CAPS: 500.0000 mg | ORAL_CAPSULE | Freq: Two times a day (BID) | ORAL | 0 refills | Status: AC

## 2021-12-16 MED ORDER — CEFADROXIL 500 MG PO CAPS
500.0000 mg | ORAL_CAPSULE | Freq: Two times a day (BID) | ORAL | 0 refills | Status: DC
  Filled 2021-12-16: qty 10, 5d supply, fill #0

## 2021-12-16 MED ORDER — CEFADROXIL 500 MG PO CAPS
500.0000 mg | ORAL_CAPSULE | Freq: Two times a day (BID) | ORAL | Status: DC
  Administered 2021-12-16: 500 mg via ORAL
  Filled 2021-12-16: qty 1

## 2021-12-16 MED ORDER — POTASSIUM CHLORIDE 20 MEQ PO PACK: 20.0000 meq | PACK | Freq: Every day | ORAL | 0 refills | Status: DC

## 2021-12-16 MED ORDER — SPIRONOLACTONE 100 MG PO TABS
150.0000 mg | ORAL_TABLET | Freq: Every day | ORAL | 0 refills | Status: DC
  Filled 2021-12-16: qty 45, 30d supply, fill #0

## 2021-12-16 MED ORDER — FUROSEMIDE 20 MG PO TABS
20.0000 mg | ORAL_TABLET | Freq: Once | ORAL | Status: AC
  Administered 2021-12-16: 20 mg via ORAL
  Filled 2021-12-16: qty 1

## 2021-12-16 NOTE — Assessment & Plan Note (Signed)
Presented with abdominal distension due to ascites ?Now s/p paracentesis, 2.3 L removed by IR on 4/8 ?Plan is for evaluation by the [facility] ?Increase lasix to 60 mg and spironolactone to 150 mg, follow and increase as tolerated outpatient ?Continue nadolol ?Continue lactulose ?Follow outpatient with GI and PCP ?

## 2021-12-16 NOTE — Assessment & Plan Note (Addendum)
Noted since 3/20 ?Related to cirrhosis, splenic sequestration? ?Follow outpatient, fever precautions given  ?

## 2021-12-16 NOTE — Assessment & Plan Note (Addendum)
Replace and follow ?Improved, follow outpatient ?

## 2021-12-16 NOTE — Discharge Summary (Addendum)
Physician Discharge Summary  ?Briana Sweeney DOB: 1971-05-07 DOA: 12/15/2021 ? ?PCP: Briana Collie, MD ? ?Admit date: 12/15/2021 ?Discharge date: 12/16/2021 ? ?Time spent: 40 minutes ? ?Recommendations for Outpatient Follow-up:  ?Follow outpatient CBC/CMP  ?Follow potassium outpatient ?Follow lytes/kidney function/BP on increased regimen of lasix/spironolactone ?Encourage etoh cessation, naltrexone discontinued in setting of her [place] ?Follow with [place] IR Portal HTN clinic outpatient ? ?Discharge Diagnoses:  ?Principal Problem: ?  Hypokalemia ?Active Problems: ?  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  Severe neutropenia (HCC) ?  Pancytopenia ([place]) ?  Alcohol use disorder, severe, dependence ([place]) ?  GERD (gastroesophageal reflux disease) ?  Cellulitis ?  Seizure ([place]) ?  Mild protein malnutrition ([place]) ? ? ?Discharge Condition: stable ? ?Diet recommendation: heart healthy ? ?Filed Weights  ? 12/15/21 [phone] 12/15/21 0635 12/16/21 0340  ?Weight: 65.8 kg 66 kg 67.8 kg  ? ? ?History of present illness:  ? Briana Sweeney is Briana Sweeney 51 y.o. female with below past medical history significant for alcoholic liver cirrhosis who came into the emergency department with complaints of of abdominal distention abdominal pain due to ascites.  She's improved after paracentesis.  She was discharged on 4/9 in stable condition on increased diuretic regimen and plan for outpatient follow up with IR. ? ?See below for additional details ? ?Hospital Course:  ?Assessment and Plan: ?* Hypokalemia ?Replace and follow ?Improved, follow outpatient ? 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 ?Severe neutropenia (HCC) ?Noted since 3/20 ?Related to cirrhosis, splenic sequestration? ?Follow outpatient, fever precautions given  ? ?Pancytopenia ([place]) ?Gets nplate with oncology outpatient ?Generally relatively stable, platelets downtrending from admission ?Follow outpatient with oncology ? ?Alcohol use disorder, severe, dependence ([place]) ?Still drinking daily ?Encouraged cessation ?She's on naltrexone, will stop in setting of cirrhosis ?Follow with PCP outpatient ? ?Cellulitis ?abx ?cellulits where sutures were in LLQ, sutures removed by RN, discharged with abx ? ?Seizure ([place]) ?keppra ? ? ?Procedures: ?Paracentesis by IR  ? ?Consultations: ?IR ? ?Discharge Exam: ?Vitals:  ? 12/16/21 0340 12/16/21 1450  ?BP: 116/77 106/85  ?Pulse: 88 81  ?Resp: 18 16  ?Temp: 98.3 ?F (36.8 ?C) 98.4 ?F (36.9 ?C)  ?SpO2: 93% 92%  ? ?No complaints ?Asking [place] to remove sutures from abdomen (they were supposed to be in for Briana Sweeney, it's been 2 now) ? ?General: No acute distress. ?Cardiovascular: RRR ?Lungs: unlabored ?Abdomen: abdominal distension, nontender.  Sutures to LLQ with surrounding erythema. ?Neurological: Alert and oriented ?3. Moves all extremities ?4 . Cranial nerves II through XII grossly intact.  Minimal asterixis. ?Skin: Warm and dry. No rashes or lesions. ?Extremities: No clubbing or cyanosis. No edema.  ?Discharge Instructions ? ? ?Discharge Instructions   ? ? Call MD for:  difficulty breathing, headache or visual disturbances   Complete by: As directed ?  ? Call MD for:  extreme fatigue   Complete by: As directed ?  ? Call MD for:  hives   Complete by: As directed ?  ? Call MD for:  persistant dizziness or light-headedness   Complete by: As directed ?  ? Call MD for:  persistant nausea  and vomiting   Complete by: As directed ?  ? Call MD for:  redness, tenderness, or signs of infection (pain, swelling, redness, odor or green/yellow discharge around incision site)    Complete by: As directed ?  ? Call MD for:  severe uncontrolled pain   Complete by: As directed ?  ? Call MD for:  temperature >100.4   Complete by: As directed ?  ? Diet - low sodium heart healthy   Complete by: As directed ?  ? Discharge instructions   Complete by: As directed ?  ? You were seen for Briana Sweeney paracentesis. ? ?You've improved after your paracentesis.  We'll increase your lasix to 60 mg and your spironolactone to 150 mg.  If you develop lightheadedness or dizziness, resume your prior to admission doses and follow with your PCP.   ? ?Follow up with interventional radiology at [facility].  They're going to evaluate you in the [facility] Interventional Radiology Portal Hypertension Clinic. ? ?Your counts are low, but appear stable.  Your white blood cell counts are low.  If you have Briana Sweeney fever, come to the hospital immediately.  Follow up with Briana Sweeney from hematology as planned. ? ?We'll send you with antibiotics for the infection around the sutures. ? ?You'll need repeat labs in about Briana Sweeney Sweeney to follow your electrolytes and kidney function.  We'll send you with some supplemental potassium. ? ?It's extremely important you stop your alcohol use.  Please follow up with your PCP to discuss resources.  I'd recommend stopping the naltrexone, this is not recommended in liver failure.  You can discuss other options with your PCP. ? ?Return for new, recurrent, or worsening symptoms. ? ?Please ask your PCP to request records from this hospitalization so they know what was done and what the next steps will be.  ? Increase activity slowly   Complete by: As directed ?  ? Leave dressing on - Keep it clean, dry, and intact until clinic visit   Complete by: As directed ?  ? No dressing needed   Complete by: As directed ?  ? ?  ? ?Allergies as of 12/16/2021   ? ?   Reactions  ? Other Other (See Comments)  ? Platelets: Rx chest pain, tremors, body aches  ? ?  ? ?  ?Medication List  ?  ? ?STOP taking these medications   ? ?naltrexone 50  MG tablet ?Commonly known as: DEPADE ?  ? ?  ? ?TAKE these medications   ? ?ARIPiprazole 10 MG tablet ?Commonly known as: ABILIFY ?Take 1 tablet by mouth every day ?What changed:  ?how much to take ?how to take this ?when to take this ?  ?cefadroxil 500 MG capsule ?Commonly known as: DURICEF ?Take 1 capsule (500 mg total) by mouth 2 (two) times daily for 5 days. ?  ?clonazePAM 0.5 MG tablet ?Commonly known as: KLONOPIN ?Take 1 tablet by mouth 2 times daily. ?What changed: Another medication with the same name was removed. Continue taking this medication, and follow the directions you see here. ?  ?diclofenac Sodium 1 % Gel ?Commonly known as: VOLTAREN ?Apply 1 application. topically at bedtime as needed (arthritis pain). ?  ?FLUoxetine 40 MG capsule ?Commonly known as: PROZAC ?Take 1 capsule (40 mg total) by mouth daily. ?What changed: when to take this ?  ?folic acid 1 MG tablet ?Commonly known as: FOLVITE ?Take 1 tablet (1 mg total) by mouth once daily ?What changed: Another medication with the same name was  removed. Continue taking this medication, and follow the directions you see here. ?  ?furosemide 40 MG tablet ?Commonly known as: LASIX ?Take 1.5 tablets (60 mg total) by mouth daily. ?What changed:  ?how much to take ?how to take this ?when to take this ?  ?gabapentin 300 MG capsule ?Commonly known as: NEURONTIN ?Take 300 mg by mouth 3 (three) times daily. ?  ?lactulose 10 GM/15ML solution ?Commonly known as: [place] ?Take 30 mLs by mouth 2 (two) times daily as needed for Constipation (take as needed to have goal 2-3 bowel movements daily) for up to 30 days ?What changed:  ?how much to take ?how to take this ?when to take this ?  ?levETIRAcetam 750 MG tablet ?Commonly known as: KEPPRA ?Take 1 tablet (750 mg total) by mouth 2 (two) times daily. ?  ?multivitamin with minerals Tabs tablet ?Take 1 tablet by mouth daily. ?  ?nadolol 20 MG tablet ?Commonly known as: [place] ?Take 1 tablet by mouth daily ?What  changed:  ?how much to take ?how to take this ?when to take this ?  ?pantoprazole 40 MG tablet ?Commonly known as: PROTONIX ?Take 1 tablet (40 mg total) by mouth 2 (two) times daily before meals ?What changed

## 2021-12-16 NOTE — Assessment & Plan Note (Signed)
abx ?cellulits where sutures were in LLQ, sutures removed by RN, discharged with abx ?

## 2021-12-16 NOTE — Hospital Course (Signed)
Briana Sweeney is Briana Sweeney 51 y.o. female with below past medical history significant for alcoholic liver cirrhosis who came into the emergency department with complaints of of abdominal distention abdominal pain due to ascites.  She's improved after paracentesis.  She was discharged on 4/9 in stable condition on increased diuretic regimen and plan for outpatient follow up with IR. ? ?See below for additional details ?

## 2021-12-16 NOTE — Progress Notes (Signed)
PT Cancellation Note ? ?Patient Details ?Name: Briana Sweeney ?MRN: [id number] ?DOB: 23-Jul-1971 ? ? ?Cancelled Treatment:    Reason Eval/Treat Not Completed: Other (comment) ?Pt dressed, bag in hand, walking around room, ride here for d/c.  Will sign off.  ?Abran Richard, PT ?Acute Rehab Services ?Pager [phone] ?[facility] [phone] ? ? ?Briana Sweeney ?12/16/2021, 5:08 PM ?

## 2021-12-16 NOTE — Assessment & Plan Note (Signed)
Still drinking daily ?Encouraged cessation ?She's on naltrexone, will stop in setting of cirrhosis ?Follow with PCP outpatient ?

## 2021-12-16 NOTE — Progress Notes (Signed)
The patient is alert and oriented and has been seen by her physician. The orders for discharge were written. IV and telemetry have been removed. Went over discharge instructions with patient. She is being discharged via wheelchair with all of her belongings.   

## 2021-12-16 NOTE — Assessment & Plan Note (Signed)
keppra  

## 2021-12-16 NOTE — Assessment & Plan Note (Signed)
Gets nplate with oncology outpatient ?Generally relatively stable, platelets downtrending from admission ?Follow outpatient with oncology ?

## 2021-12-16 NOTE — Plan of Care (Signed)
  Problem: Education: Goal: Knowledge of General Education information will improve Description Including pain rating scale, medication(s)/side effects and non-pharmacologic comfort measures Outcome: Progressing   Problem: Health Behavior/Discharge Planning: Goal: Ability to manage health-related needs will improve Outcome: Progressing   

## 2021-12-17 ENCOUNTER — Other Ambulatory Visit: Payer: Medicaid Other

## 2021-12-17 ENCOUNTER — Other Ambulatory Visit (HOSPITAL_COMMUNITY): Payer: Self-pay

## 2021-12-17 LAB — PATHOLOGIST SMEAR REVIEW

## 2021-12-17 NOTE — Progress Notes (Signed)
?[place] [facility]  ?[address] ?[place],  [place]  [address] ?[phone] ? ?Clinic Day:  12/18/2021 ? ?Referring physician: Marco Collie, MD ? ? ?HISTORY OF PRESENT ILLNESS:  ?The patient is a 51 y.o. 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  She has chronically been receiving weekly Nplate so we will keep her platelets at a suitable level.  Over these past week, the patient has started with mortality.  She had severe hematemesis to where she had to be airlifted to [facility].  While there, she had her esophageal varices banded.  Furthermore, she required numerous units of platelets, red blood cells, and fresh frozen plasma.  Cryoprecipitate was also apparently given.  She also required an octreotide drip.  The patient has undergone 2 paracenteses over these past few weeks, including a few days ago when 2.3 liters of ascites were removed.  Despite knowing the grave seriousness of her underlying medical problems, the patient continues to consume alcohol, including a large can of beer yesterday.  She denies having any recent bleeding/bruising complications which have alerted her to severe thrombocytopenia having returned.   ? ?PHYSICAL EXAM:  ?Blood pressure 113/64, pulse 76, temperature 97.8 ?F (36.6 ?C), resp. rate 14, height '4\' 11"'$  (1.499 m), weight 136 lb 14.4 oz (62.1 kg), SpO2 97 %. ?Wt Readings from Last 3 Encounters:  ?12/18/21 136 lb 14.4 oz (62.1 kg)  ?12/16/21 149 lb 7.6 oz (67.8 kg)  ?11/28/21 145 lb (65.8 kg)  ? ?Body mass index is 27.65 kg/m?[name] Kitchen ?Performance status (ECOG): 1 - Symptomatic but completely ambulatory ?Physical Exam ?Constitutional:   ?   Appearance: Normal appearance. She is not ill-appearing.  ?HENT:  ?   Mouth/Throat:  ?   Mouth: Mucous membranes are moist.  ?   Pharynx: Oropharynx is clear. No oropharyngeal exudate or posterior oropharyngeal erythema.  ?Cardiovascular:  ?   Rate and Rhythm:  Normal rate and regular rhythm.  ?   Heart sounds: No murmur heard. ?  No friction rub. No gallop.  ?Pulmonary:  ?   Effort: Pulmonary effort is normal. No respiratory distress.  ?   Breath sounds: Normal breath sounds. No wheezing, rhonchi or rales.  ?Abdominal:  ?   General: Abdomen is protuberant. Bowel sounds are normal. There is no distension.  ?   Palpations: Abdomen is soft. There is no mass.  ?   Tenderness: There is no abdominal tenderness.  ?Musculoskeletal:     ?   General: No swelling.  ?   Right lower leg: No edema.  ?   Left lower leg: No edema.  ?Lymphadenopathy:  ?   Cervical: No cervical adenopathy.  ?   Upper Body:  ?   Right upper body: No supraclavicular or axillary adenopathy.  ?   Left upper body: No supraclavicular or axillary adenopathy.  ?   Lower Body: No right inguinal adenopathy. No left inguinal adenopathy.  ?Skin: ?   General: Skin is warm.  ?   Coloration: Skin is not jaundiced.  ?   Findings: No lesion or rash.  ?Neurological:  ?   General: No focal deficit present.  ?   Mental Status: She is alert and oriented to person, place, and time. Mental status is at baseline.  ?Psychiatric:     ?   Mood and Affect: Mood normal.     ?   Behavior: Behavior normal.     ?   Thought Content: Thought  content normal.  ? ? ?LABS:  ? ? ?  Latest Ref Rng & Units 12/18/2021  ? 12:00 AM 12/16/2021  ? 12:05 PM 12/16/2021  ?  5:03 AM  ?CBC  ?WBC  2.6       1.5    ?Hemoglobin 12.0 - 16.0 8.9      8.0   7.3    ?Hematocrit 36 - 46 29      26.3   24.1    ?Platelets 150 - 400 K/uL 74       53    ?  ? This result is from an external source.  ? ? ?  Latest Ref Rng & Units 12/16/2021  ?  5:03 AM 12/15/2021  ?  6:48 AM 11/02/2021  ?  3:07 AM  ?CMP  ?Glucose 70 - 99 mg/dL 89   93   113    ?BUN 6 - 20 mg/dL '7   8   6    '$ ?Creatinine 0.44 - 1.00 mg/dL 0.53   0.51   0.60    ?Sodium 135 - 145 mmol/L 134   137   133    ?Potassium 3.5 - 5.1 mmol/L 3.3   2.4   3.9    ?Chloride 98 - 111 mmol/L 101   99   101    ?CO2 22 - 32 mmol/L '26    29   25    '$ ?Calcium 8.9 - 10.3 mg/dL 8.0   7.8   8.0    ?Total Protein 6.5 - 8.1 g/dL 6.3   6.7   6.0    ?Total Bilirubin 0.3 - 1.2 mg/dL 2.3   2.0   5.5    ?Alkaline Phos 38 - 126 U/L 134   143   159    ?AST 15 - 41 U/L 43   50   74    ?ALT 0 - 44 U/L 16   18   58    ? ?STUDIES:  ?DG Chest 2 View ? ?Result Date: 12/15/2021 ?CLINICAL DATA:  Abdominal swelling EXAM: CHEST - 2 VIEW COMPARISON:  10/31/2021 FINDINGS: Normal heart size for technique. Chronic elevation of the right diaphragm. Granulomatous calcifications at the left hilum. There is no edema, consolidation, effusion, or pneumothorax. Postoperative right shoulder. IMPRESSION: Stable exam.  No evidence of acute disease. Electronically Signed   By: Jorje Guild M.D.   On: 12/15/2021 07:58  ? ?US Paracentesis ? ?Result Date: 12/15/2021 ?INDICATION: History of cirrhosis with recurrent ascites. Request for therapeutic and diagnostic paracentesis. EXAM: ULTRASOUND GUIDED  PARACENTESIS MEDICATIONS: 10 mL 1% lidocaine COMPLICATIONS: None immediate. PROCEDURE: Informed written consent was obtained from the patient after a discussion of the risks, benefits and alternatives to treatment. A timeout was performed prior to the initiation of the procedure. Initial ultrasound scanning demonstrates a large amount of ascites within the left lower abdominal quadrant. The left lower abdomen was prepped and draped in the usual sterile fashion. 1% lidocaine was used for local anesthesia. Following this, a 19 gauge, 7-cm, Yueh catheter was introduced. An ultrasound image was saved for documentation purposes. The paracentesis was performed. The catheter was removed and a dressing was applied. The patient tolerated the procedure well without immediate post procedural complication. FINDINGS: A total of approximately 2.3 L of hazy yellow fluid was removed. Samples were sent to the laboratory as requested by the clinical team. IMPRESSION: Successful ultrasound-guided paracentesis  yielding 2.3 liters of peritoneal fluid. PLAN: The patient has required >/=2 paracenteses in  a 30 day period and a formal evaluation by the [facility] Portal Hypertension Clinic has been arranged. Read by: Durenda Guthrie, PA-C Electronically Signed   By: Lucrezia Europe M.D.   On: 12/15/2021 11:41    ? ? ?ASSESSMENT & PLAN:  ?Assessment/Plan:  A 51 y.o. 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  She will continue to receive her weekly Nplate injections.  I am particularly pleased as her platelet count 74 is one of the better levels for she has had numerous months.  Once again, I had a stern conversation with her about her alcohol consumption.  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  This is based upon her needing both paracenteses and esophageal variceal banding.  Despite hearing this, I still have doubts that she will adhere to abstinence from alcohol.  Her CBC will continue to be checked weekly.  I will also check her iron levels today to ensure her bouts of hematemesis have not led to iron deficiency.  Otherwise, I will see her back in 2 months for repeat clinical assessment.  The patient understands all the plans discussed today and is in agreement with them.   ? ? ? ?Ignatz Deis Macarthur Critchley, MD   ? ? ?  ?

## 2021-12-17 NOTE — Progress Notes (Signed)
? ?  Portal Hypertension Clinic Screening Evaluation ? ? ?Indication for evaluation: ?Briana Sweeney is a 51 y.o. female undergoing preliminary evaluation in the [facility] Portal Hypertension [facility] due to recurrent ascites. ? ?Referring Physician/Established Gastroenterologist: None establish per chart review, prior EGD with Dr. Jackquline Denmark [facility]) ? ?Etiology of cirrhosis: EtOH ?Initially diagnosed: 2018 ?# of paracentesis in last month: 2 ?# of paracentesis in last 2 months: 3 ?History of hepatic hydrothorax:  No ?History of hepatic encephalopathy: No ? ?Prior evaluation for liver transplant: No ?History of hepatocellular carcinoma: No ? ?Prior esophagogastroduodenoscopy/intervention:  ?-06/26/21 - Grade III large esophageal varices (banded x6), portal gastropathy (Dr. Lyndel Safe) ?-09/03/2017 - Grade II esophageal varices, portal gastropathy ?Current esophageal varices: Yes ?Current gastric varices: No ?History of hematemesis: Yes ? ?Current diuretic regimen: Furosemide 60 mg QD, Spironolactone 150 mg QD ?Current pharmacologic encephalopathy prophylaxis/treatment: Lactulose 30 mL BID (2-3 BM per day) ? ?History of renal dysfunction: No ?History of hemodialysis: No ? ?History of cardiac dysfunction: No ? ?Other pertinent past medical history: Bipolar, depression, HCV, thrombocytopenia, seizures ? ? ?Imaging: ?Prior cross sectional imaging of portal system: ?CT AP 11/01/21 ? ?Patent portal system with large esophageal variceal network arising from left gastric vein.  Amenable anatomy for TIPS creation. ? ?Echocardiogram:  ?07/24/21 - EF 60-65%, mild tricuspid regurgitation ? ? ?Labs: ?4/8-9/23 ?Creatinine: 0.53 ?Total Bilirubin: 2.3 ?INR: 1.5 ?Sodium: 134 ?Albumin: 2.8 ? ?Child-Pugh = 9 points, class B ?MELD = 17 (6.0% estimated 3 month mortality) ?[place] Index of Post-TIPS Survival (FIPS) = -0.98 (Overall survival at 1 month 98.3%, 3 months 94.7%, 6 months  92.4%) ? ? ? ?Assessment: ?Briana Sweeney is a 51 y.o. female with history of alcoholic cirrhosis and recurrent ascites (Child Pugh B, MELD 17).  After preliminary evaluation, this patient may potentially benefit from TIPS creation, however appears to be suboptimally followed by Gastroenterologist (multiple mentions of following up with GI as outpatient, but none documented), and has room increase her current regimen of diuretics.  She is at high risk of recurrent esophageal variceal hemorrhage given large varices on most recent CT. ? ?Recommendation: ?Formal consult for TIPS could be considered, however only after establishing routine care with Gastroentology.  Dr. Lyndel Safe, who performed her most recent EGD, will be contacted for further discussion. ? ? ? ?Electronically Signed: ?Suzette Battiest, MD ?12/17/2021, 8:15 AM ? ? ? ?

## 2021-12-18 ENCOUNTER — Inpatient Hospital Stay: Payer: Commercial Managed Care - HMO | Attending: Hematology & Oncology | Admitting: Oncology

## 2021-12-18 ENCOUNTER — Telehealth: Payer: Self-pay | Admitting: Oncology

## 2021-12-18 ENCOUNTER — Other Ambulatory Visit: Payer: Self-pay | Admitting: Pharmacist

## 2021-12-18 ENCOUNTER — Inpatient Hospital Stay: Payer: Commercial Managed Care - HMO

## 2021-12-18 ENCOUNTER — Other Ambulatory Visit: Payer: Self-pay | Admitting: Oncology

## 2021-12-18 VITALS — BP 113/64 | HR 76 | Temp 97.8°F | Resp 14 | Ht 59.0 in | Wt 136.9 lb

## 2021-12-18 DIAGNOSIS — D696 Thrombocytopenia, unspecified: Secondary | ICD-10-CM

## 2021-12-18 DIAGNOSIS — D693 Immune thrombocytopenic purpura: Secondary | ICD-10-CM | POA: Insufficient documentation

## 2021-12-18 DIAGNOSIS — K7030 Alcoholic cirrhosis of liver without ascites: Secondary | ICD-10-CM | POA: Insufficient documentation

## 2021-12-18 DIAGNOSIS — K920 Hematemesis: Secondary | ICD-10-CM | POA: Insufficient documentation

## 2021-12-18 DIAGNOSIS — F1020 Alcohol dependence, uncomplicated: Secondary | ICD-10-CM | POA: Diagnosis not present

## 2021-12-18 LAB — CBC AND DIFFERENTIAL
HCT: 29 — AB (ref 36–46)
Hemoglobin: 8.9 — AB (ref 12.0–16.0)
Neutrophils Absolute: 1.04
Platelets: 74 10*3/uL — AB (ref 150–400)
WBC: 2.6

## 2021-12-18 LAB — IRON AND TIBC
Iron: 29 ug/dL (ref 28–170)
Saturation Ratios: 8 % — ABNORMAL LOW (ref 10.4–31.8)
TIBC: 346 ug/dL (ref 250–450)
UIBC: 317 ug/dL

## 2021-12-18 LAB — FERRITIN: Ferritin: 22 ng/mL (ref 11–307)

## 2021-12-18 LAB — CBC: RBC: 3.68 — AB (ref 3.87–5.11)

## 2021-12-18 NOTE — Progress Notes (Signed)
Pt states that she was vomiting bright red blood and presented to ED @ [place] where she was flown to [facility] with PLT count of 0.  Went into cardiac arrest.  Paracentesis x 2.  Low wbc. ?

## 2021-12-18 NOTE — Telephone Encounter (Signed)
Per 12/18/21 los next appt scheduled and confirmed with patient ?

## 2021-12-19 ENCOUNTER — Inpatient Hospital Stay: Payer: Commercial Managed Care - HMO

## 2021-12-19 ENCOUNTER — Encounter: Payer: Self-pay | Admitting: Oncology

## 2021-12-19 VITALS — BP 106/68 | HR 81 | Temp 98.0°F | Resp 16 | Wt 140.0 lb

## 2021-12-19 DIAGNOSIS — D693 Immune thrombocytopenic purpura: Secondary | ICD-10-CM | POA: Diagnosis not present

## 2021-12-19 DIAGNOSIS — D696 Thrombocytopenia, unspecified: Secondary | ICD-10-CM

## 2021-12-19 LAB — BODY FLUID CULTURE W GRAM STAIN: Culture: NO GROWTH

## 2021-12-19 MED ORDER — ROMIPLOSTIM 250 MCG ~~LOC~~ SOLR
3.7000 ug/kg | Freq: Once | SUBCUTANEOUS | Status: AC
  Administered 2021-12-19: 250 ug via SUBCUTANEOUS
  Filled 2021-12-19: qty 0.47

## 2021-12-19 NOTE — Patient Instructions (Signed)
Romiplostim injection ?What is this medication? ?ROMIPLOSTIM (roe mi PLOE stim) helps your body make more platelets. This medicine is used to treat low platelets caused by chronic idiopathic thrombocytopenic purpura (ITP) or a bone marrow syndrome caused by radiation sickness. ?This medicine may be used for other purposes; ask your health care provider or pharmacist if you have questions. ?COMMON BRAND NAME(S): Nplate ?What should I tell my care team before I take this medication? ?They need to know if you have any of these conditions: ?blood clots ?myelodysplastic syndrome ?an unusual or allergic reaction to romiplostim, mannitol, other medicines, foods, dyes, or preservatives ?pregnant or trying to get pregnant ?breast-feeding ?How should I use this medication? ?This medicine is injected under the skin. It is given by a health care provider in a hospital or clinic setting. ?A special MedGuide will be given to you before each treatment. Be sure to read this information carefully each time. ?Talk to your health care provider about the use of this medicine in children. While it may be prescribed for children as young as newborns for selected conditions, precautions do apply. ?Overdosage: If you think you have taken too much of this medicine contact a poison control center or emergency room at once. ?NOTE: This medicine is only for you. Do not share this medicine with others. ?What if I miss a dose? ?Keep appointments for follow-up doses. It is important not to miss your dose. Call your health care provider if you are unable to keep an appointment. ?What may interact with this medication? ?Interactions are not expected. ?This list may not describe all possible interactions. Give your health care provider a list of all the medicines, herbs, non-prescription drugs, or dietary supplements you use. Also tell them if you smoke, drink alcohol, or use illegal drugs. Some items may interact with your medicine. ?What should I  watch for while using this medication? ?Visit your health care provider for regular checks on your progress. You may need blood work done while you are taking this medicine. Your condition will be monitored carefully while you are receiving this medicine. It is important not to miss any appointments. ?What side effects may I notice from receiving this medication? ?Side effects that you should report to your doctor or health care professional as soon as possible: ?allergic reactions (skin rash, itching or hives; swelling of the face, lips, or tongue) ?bleeding (bloody or black, tarry stools; red or dark brown urine; spitting up blood or brown material that looks like coffee grounds; red spots on the skin; unusual bruising or bleeding from the eyes, gums, or nose) ?blood clot (chest pain; shortness of breath; pain, swelling, or warmth in the leg) ?stroke (changes in vision; confusion; trouble speaking or understanding; severe headaches; sudden numbness or weakness of the face, arm or leg; trouble walking; dizziness; loss of balance or coordination) ?Side effects that usually do not require medical attention (report to your doctor or health care professional if they continue or are bothersome): ?diarrhea ?dizziness ?headache ?joint pain ?muscle pain ?stomach pain ?trouble sleeping ?This list may not describe all possible side effects. Call your doctor for medical advice about side effects. You may report side effects to FDA at 1-800-FDA-1088. ?Where should I keep my medication? ?This medicine is given in a hospital or clinic. It will not be stored at home. ?NOTE: This sheet is a summary. It may not cover all possible information. If you have questions about this medicine, talk to your doctor, pharmacist, or health care provider. ??   2022 Elsevier/Gold Standard (2021-05-15 00:00:00) ? ?

## 2021-12-21 ENCOUNTER — Other Ambulatory Visit (HOSPITAL_COMMUNITY): Payer: Self-pay

## 2021-12-24 ENCOUNTER — Inpatient Hospital Stay: Payer: Commercial Managed Care - HMO

## 2021-12-24 DIAGNOSIS — D696 Thrombocytopenia, unspecified: Secondary | ICD-10-CM

## 2021-12-24 LAB — CBC AND DIFFERENTIAL
HCT: 28 — AB (ref 36–46)
Hemoglobin: 8.3 — AB (ref 12.0–16.0)
Neutrophils Absolute: 0.52
Platelets: 32 10*3/uL — AB (ref 150–400)
WBC: 1.8

## 2021-12-24 LAB — CBC: RBC: 3.54 — AB (ref 3.87–5.11)

## 2021-12-24 NOTE — Progress Notes (Signed)
Spoke with [facility] and set up transportation for [place] 12/31/2021 and 01/02/2022. ?

## 2021-12-26 ENCOUNTER — Ambulatory Visit: Payer: Self-pay

## 2021-12-26 ENCOUNTER — Telehealth: Payer: Self-pay | Admitting: Oncology

## 2021-12-26 ENCOUNTER — Telehealth: Payer: Self-pay

## 2021-12-26 NOTE — Telephone Encounter (Signed)
12/26/21 call from [place] cancelled due to patient having thoracentesis ?

## 2021-12-26 NOTE — Telephone Encounter (Signed)
Attempted to contact patient to schedule a Palliative Care consult appointment. No answer left a message to return call.  

## 2021-12-31 ENCOUNTER — Inpatient Hospital Stay: Payer: Commercial Managed Care - HMO

## 2021-12-31 DIAGNOSIS — D696 Thrombocytopenia, unspecified: Secondary | ICD-10-CM

## 2021-12-31 LAB — CBC AND DIFFERENTIAL
HCT: 27 — AB (ref 36–46)
Hemoglobin: 8.3 — AB (ref 12.0–16.0)
Neutrophils Absolute: 1.19
Platelets: 19 10*3/uL — AB (ref 150–400)
WBC: 2.2

## 2021-12-31 LAB — CBC: RBC: 3.51 — AB (ref 3.87–5.11)

## 2021-12-31 NOTE — Progress Notes (Signed)
Spoke with [facility] and set up transportation for DOS 05/01, 05/03, 05/08, and 05/10. ?

## 2021-12-31 NOTE — Progress Notes (Unsigned)
Dr. [name] aware of results  ?

## 2022-01-01 ENCOUNTER — Telehealth: Payer: Self-pay

## 2022-01-01 ENCOUNTER — Other Ambulatory Visit: Payer: Self-pay | Admitting: Pharmacist

## 2022-01-01 NOTE — Telephone Encounter (Signed)
Spoke with patient regarding Palliative Care services. Patient declined services at this time. Will cancel referral and notify referring provider.  ?

## 2022-01-02 ENCOUNTER — Inpatient Hospital Stay: Payer: Commercial Managed Care - HMO

## 2022-01-02 VITALS — BP 129/84 | HR 112 | Temp 98.3°F | Resp 16 | Wt 143.0 lb

## 2022-01-02 DIAGNOSIS — D693 Immune thrombocytopenic purpura: Secondary | ICD-10-CM | POA: Diagnosis not present

## 2022-01-02 DIAGNOSIS — D696 Thrombocytopenia, unspecified: Secondary | ICD-10-CM

## 2022-01-02 MED ORDER — ROMIPLOSTIM 250 MCG ~~LOC~~ SOLR
3.9000 ug/kg | Freq: Once | SUBCUTANEOUS | Status: AC
  Administered 2022-01-02: 250 ug via SUBCUTANEOUS
  Filled 2022-01-02: qty 0.5

## 2022-01-02 NOTE — Patient Instructions (Signed)
Romiplostim injection ?What is this medication? ?ROMIPLOSTIM (roe mi PLOE stim) helps your body make more platelets. This medicine is used to treat low platelets caused by chronic idiopathic thrombocytopenic purpura (ITP) or a bone marrow syndrome caused by radiation sickness. ?This medicine may be used for other purposes; ask your health care provider or pharmacist if you have questions. ?COMMON BRAND NAME(S): Nplate ?What should I tell my care team before I take this medication? ?They need to know if you have any of these conditions: ?blood clots ?myelodysplastic syndrome ?an unusual or allergic reaction to romiplostim, mannitol, other medicines, foods, dyes, or preservatives ?pregnant or trying to get pregnant ?breast-feeding ?How should I use this medication? ?This medicine is injected under the skin. It is given by a health care provider in a hospital or clinic setting. ?A special MedGuide will be given to you before each treatment. Be sure to read this information carefully each time. ?Talk to your health care provider about the use of this medicine in children. While it may be prescribed for children as young as newborns for selected conditions, precautions do apply. ?Overdosage: If you think you have taken too much of this medicine contact a poison control center or emergency room at once. ?NOTE: This medicine is only for you. Do not share this medicine with others. ?What if I miss a dose? ?Keep appointments for follow-up doses. It is important not to miss your dose. Call your health care provider if you are unable to keep an appointment. ?What may interact with this medication? ?Interactions are not expected. ?This list may not describe all possible interactions. Give your health care provider a list of all the medicines, herbs, non-prescription drugs, or dietary supplements you use. Also tell them if you smoke, drink alcohol, or use illegal drugs. Some items may interact with your medicine. ?What should I  watch for while using this medication? ?Visit your health care provider for regular checks on your progress. You may need blood work done while you are taking this medicine. Your condition will be monitored carefully while you are receiving this medicine. It is important not to miss any appointments. ?What side effects may I notice from receiving this medication? ?Side effects that you should report to your doctor or health care professional as soon as possible: ?allergic reactions (skin rash, itching or hives; swelling of the face, lips, or tongue) ?bleeding (bloody or black, tarry stools; red or dark brown urine; spitting up blood or brown material that looks like coffee grounds; red spots on the skin; unusual bruising or bleeding from the eyes, gums, or nose) ?blood clot (chest pain; shortness of breath; pain, swelling, or warmth in the leg) ?stroke (changes in vision; confusion; trouble speaking or understanding; severe headaches; sudden numbness or weakness of the face, arm or leg; trouble walking; dizziness; loss of balance or coordination) ?Side effects that usually do not require medical attention (report to your doctor or health care professional if they continue or are bothersome): ?diarrhea ?dizziness ?headache ?joint pain ?muscle pain ?stomach pain ?trouble sleeping ?This list may not describe all possible side effects. Call your doctor for medical advice about side effects. You may report side effects to FDA at 1-800-FDA-1088. ?Where should I keep my medication? ?This medicine is given in a hospital or clinic. It will not be stored at home. ?NOTE: This sheet is a summary. It may not cover all possible information. If you have questions about this medicine, talk to your doctor, pharmacist, or health care provider. ??   2023 Elsevier/Gold Standard (2021-07-27 00:00:00) ? ?

## 2022-01-07 ENCOUNTER — Inpatient Hospital Stay: Payer: Commercial Managed Care - HMO | Attending: Hematology & Oncology

## 2022-01-07 ENCOUNTER — Ambulatory Visit: Payer: Medicaid Other | Admitting: Neurology

## 2022-01-07 ENCOUNTER — Encounter: Payer: Self-pay | Admitting: Neurology

## 2022-01-07 DIAGNOSIS — D696 Thrombocytopenia, unspecified: Secondary | ICD-10-CM

## 2022-01-07 LAB — CBC AND DIFFERENTIAL
HCT: 29 — AB (ref 36–46)
Hemoglobin: 8.7 — AB (ref 12.0–16.0)
Neutrophils Absolute: 0.65
Platelets: 37 10*3/uL — AB (ref 150–400)
WBC: 1.7

## 2022-01-07 LAB — CBC: RBC: 3.77 — AB (ref 3.87–5.11)

## 2022-01-08 ENCOUNTER — Other Ambulatory Visit: Payer: Self-pay | Admitting: Pharmacist

## 2022-01-09 ENCOUNTER — Inpatient Hospital Stay: Payer: Commercial Managed Care - HMO

## 2022-01-14 ENCOUNTER — Other Ambulatory Visit: Payer: Medicaid Other

## 2022-01-15 ENCOUNTER — Encounter: Payer: Self-pay | Admitting: Oncology

## 2022-01-15 ENCOUNTER — Other Ambulatory Visit: Payer: Medicaid Other

## 2022-01-15 NOTE — Progress Notes (Signed)
Called and spoke with [facility] and scheduled transportation for DOS 05/15, 05/17, 05/22, and 05/24. ?

## 2022-01-16 ENCOUNTER — Inpatient Hospital Stay: Payer: Commercial Managed Care - HMO

## 2022-01-21 ENCOUNTER — Telehealth: Payer: Self-pay

## 2022-01-21 ENCOUNTER — Inpatient Hospital Stay: Payer: Commercial Managed Care - HMO

## 2022-01-21 ENCOUNTER — Encounter: Payer: Self-pay | Admitting: Oncology

## 2022-01-21 DIAGNOSIS — D696 Thrombocytopenia, unspecified: Secondary | ICD-10-CM

## 2022-01-21 LAB — CBC AND DIFFERENTIAL
HCT: 26 — AB (ref 36–46)
Hemoglobin: 8.1 — AB (ref 12.0–16.0)
Neutrophils Absolute: 1.15
Platelets: 27 10*3/uL — AB (ref 150–400)
WBC: 2.8

## 2022-01-21 LAB — CBC: RBC: 3.47 — AB (ref 3.87–5.11)

## 2022-01-21 NOTE — Telephone Encounter (Addendum)
I LVM on identified answering machine giving her the results of Hgb and platelets. I also reminded her of appt tomorrow for Nplate injection. I told her to call me back if she had any questions. ?

## 2022-01-22 ENCOUNTER — Encounter (HOSPITAL_COMMUNITY): Payer: Self-pay

## 2022-01-22 ENCOUNTER — Encounter: Payer: Self-pay | Admitting: Oncology

## 2022-01-22 ENCOUNTER — Emergency Department (HOSPITAL_COMMUNITY): Payer: Commercial Managed Care - HMO

## 2022-01-22 ENCOUNTER — Inpatient Hospital Stay (HOSPITAL_COMMUNITY)
Admission: EM | Admit: 2022-01-22 | Discharge: 2022-01-24 | DRG: 442 | Disposition: A | Discharge status: Home / Self Care | Payer: Commercial Managed Care - HMO | Attending: Internal Medicine | Admitting: Internal Medicine

## 2022-01-22 ENCOUNTER — Other Ambulatory Visit: Payer: Self-pay

## 2022-01-22 DIAGNOSIS — K219 Gastro-esophageal reflux disease without esophagitis: Secondary | ICD-10-CM | POA: Diagnosis present

## 2022-01-22 DIAGNOSIS — R188 Other ascites: Secondary | ICD-10-CM

## 2022-01-22 DIAGNOSIS — I8510 Secondary esophageal varices without bleeding: Secondary | ICD-10-CM | POA: Diagnosis present

## 2022-01-22 DIAGNOSIS — K766 Portal hypertension: Secondary | ICD-10-CM | POA: Diagnosis present

## 2022-01-22 DIAGNOSIS — R569 Unspecified convulsions: Secondary | ICD-10-CM

## 2022-01-22 DIAGNOSIS — D693 Immune thrombocytopenic purpura: Secondary | ICD-10-CM | POA: Diagnosis present

## 2022-01-22 DIAGNOSIS — M7989 Other specified soft tissue disorders: Secondary | ICD-10-CM | POA: Diagnosis not present

## 2022-01-22 DIAGNOSIS — K7040 Alcoholic hepatic failure without coma: Secondary | ICD-10-CM | POA: Diagnosis present

## 2022-01-22 DIAGNOSIS — F1020 Alcohol dependence, uncomplicated: Secondary | ICD-10-CM | POA: Diagnosis present

## 2022-01-22 DIAGNOSIS — E876 Hypokalemia: Secondary | ICD-10-CM | POA: Diagnosis present

## 2022-01-22 DIAGNOSIS — D696 Thrombocytopenia, unspecified: Secondary | ICD-10-CM | POA: Diagnosis present

## 2022-01-22 DIAGNOSIS — D61818 Other pancytopenia: Secondary | ICD-10-CM | POA: Diagnosis present

## 2022-01-22 DIAGNOSIS — Z801 Family history of malignant neoplasm of trachea, bronchus and lung: Secondary | ICD-10-CM

## 2022-01-22 DIAGNOSIS — D709 Neutropenia, unspecified: Secondary | ICD-10-CM | POA: Diagnosis present

## 2022-01-22 DIAGNOSIS — F209 Schizophrenia, unspecified: Secondary | ICD-10-CM | POA: Diagnosis present

## 2022-01-22 DIAGNOSIS — E871 Hypo-osmolality and hyponatremia: Secondary | ICD-10-CM | POA: Diagnosis present

## 2022-01-22 DIAGNOSIS — K7682 Hepatic encephalopathy: Principal | ICD-10-CM | POA: Diagnosis present

## 2022-01-22 DIAGNOSIS — E441 Mild protein-calorie malnutrition: Secondary | ICD-10-CM | POA: Diagnosis present

## 2022-01-22 DIAGNOSIS — I8500 Esophageal varices without bleeding: Secondary | ICD-10-CM | POA: Diagnosis present

## 2022-01-22 DIAGNOSIS — K7031 Alcoholic cirrhosis of liver with ascites: Secondary | ICD-10-CM | POA: Diagnosis present

## 2022-01-22 DIAGNOSIS — G40909 Epilepsy, unspecified, not intractable, without status epilepticus: Secondary | ICD-10-CM | POA: Diagnosis present

## 2022-01-22 DIAGNOSIS — E43 Unspecified severe protein-calorie malnutrition: Secondary | ICD-10-CM | POA: Diagnosis present

## 2022-01-22 DIAGNOSIS — D6959 Other secondary thrombocytopenia: Secondary | ICD-10-CM | POA: Diagnosis present

## 2022-01-22 DIAGNOSIS — B182 Chronic viral hepatitis C: Secondary | ICD-10-CM | POA: Diagnosis present

## 2022-01-22 DIAGNOSIS — Z808 Family history of malignant neoplasm of other organs or systems: Secondary | ICD-10-CM

## 2022-01-22 DIAGNOSIS — K7460 Unspecified cirrhosis of liver: Secondary | ICD-10-CM | POA: Diagnosis present

## 2022-01-22 DIAGNOSIS — K7290 Hepatic failure, unspecified without coma: Secondary | ICD-10-CM | POA: Diagnosis present

## 2022-01-22 LAB — FERRITIN: Ferritin: 23 ng/mL (ref 11–307)

## 2022-01-22 LAB — COMPREHENSIVE METABOLIC PANEL
ALT: 22 U/L (ref 0–44)
AST: 82 U/L — ABNORMAL HIGH (ref 15–41)
Albumin: 2.4 g/dL — ABNORMAL LOW (ref 3.5–5.0)
Alkaline Phosphatase: 227 U/L — ABNORMAL HIGH (ref 38–126)
Anion gap: 8 (ref 5–15)
BUN: <5 mg/dL — ABNORMAL LOW (ref 6–20)
CO2: 29 mmol/L (ref 22–32)
Calcium: 7.8 mg/dL — ABNORMAL LOW (ref 8.9–10.3)
Chloride: 97 mmol/L — ABNORMAL LOW (ref 98–111)
Creatinine, Ser: 0.57 mg/dL (ref 0.44–1.00)
GFR, Estimated: >60 mL/min (ref >60)
Glucose, Bld: 113 mg/dL — ABNORMAL HIGH (ref 70–99)
Potassium: 2.5 mmol/L — CL (ref 3.5–5.1)
Sodium: 134 mmol/L — ABNORMAL LOW (ref 135–145)
Total Bilirubin: 7.1 mg/dL — ABNORMAL HIGH (ref 0.3–1.2)
Total Protein: 6.8 g/dL (ref 6.5–8.1)

## 2022-01-22 LAB — CBC WITH DIFFERENTIAL/PLATELET
Abs Immature Granulocytes: 0.01 10*3/uL (ref 0.00–0.07)
Basophils Absolute: 0 10*3/uL (ref 0.0–0.1)
Basophils Relative: 1 %
Eosinophils Absolute: 0.1 10*3/uL (ref 0.0–0.5)
Eosinophils Relative: 3 %
HCT: 26.2 % — ABNORMAL LOW (ref 36.0–46.0)
Hemoglobin: 8.5 g/dL — ABNORMAL LOW (ref 12.0–15.0)
Immature Granulocytes: 0 %
Lymphocytes Relative: 33 %
Lymphs Abs: 1 10*3/uL (ref 0.7–4.0)
MCH: 24.9 pg — ABNORMAL LOW (ref 26.0–34.0)
MCHC: 32.4 g/dL (ref 30.0–36.0)
MCV: 76.6 fL — ABNORMAL LOW (ref 80.0–100.0)
Monocytes Absolute: 0.7 10*3/uL (ref 0.1–1.0)
Monocytes Relative: 24 %
Neutro Abs: 1.1 10*3/uL — ABNORMAL LOW (ref 1.7–7.7)
Neutrophils Relative %: 39 %
Platelets: 31 10*3/uL — ABNORMAL LOW (ref 150–400)
RBC: 3.42 MIL/uL — ABNORMAL LOW (ref 3.87–5.11)
RDW: 27.2 % — ABNORMAL HIGH (ref 11.5–15.5)
WBC: 2.9 10*3/uL — ABNORMAL LOW (ref 4.0–10.5)
nRBC: 0 % (ref 0.0–0.2)

## 2022-01-22 LAB — IRON AND TIBC
Iron: 41 ug/dL (ref 28–170)
Saturation Ratios: 13 % (ref 10.4–31.8)
TIBC: 308 ug/dL (ref 250–450)
UIBC: 267 ug/dL

## 2022-01-22 LAB — BLOOD GAS, VENOUS
Acid-Base Excess: 9 mmol/L — ABNORMAL HIGH (ref 0.0–2.0)
Bicarbonate: 33.5 mmol/L — ABNORMAL HIGH (ref 20.0–28.0)
O2 Saturation: 35.7 %
Patient temperature: 37
pCO2, Ven: 44 mmHg (ref 44–60)
pH, Ven: 7.49 — ABNORMAL HIGH (ref 7.25–7.43)
pO2, Ven: <31 mmHg — CL (ref 32–45)

## 2022-01-22 LAB — BODY FLUID CELL COUNT WITH DIFFERENTIAL
Eos, Fluid: 0 %
Lymphs, Fluid: 21 %
Monocyte-Macrophage-Serous Fluid: 79 % (ref 50–90)
Neutrophil Count, Fluid: 0 % (ref 0–25)
Total Nucleated Cell Count, Fluid: 63 cu mm (ref 0–1000)

## 2022-01-22 LAB — LACTATE DEHYDROGENASE, PLEURAL OR PERITONEAL FLUID: LD, Fluid: 36 U/L — ABNORMAL HIGH (ref 3–23)

## 2022-01-22 LAB — RETICULOCYTES
Immature Retic Fract: 15.7 % (ref 2.3–15.9)
RBC.: 1.8 MIL/uL — ABNORMAL LOW (ref 3.87–5.11)
Retic Count, Absolute: 38.3 10*3/uL (ref 19.0–186.0)
Retic Ct Pct: 2.1 % (ref 0.4–3.1)

## 2022-01-22 LAB — PHOSPHORUS: Phosphorus: 2.4 mg/dL — ABNORMAL LOW (ref 2.5–4.6)

## 2022-01-22 LAB — PROTIME-INR
INR: 1.9 — ABNORMAL HIGH (ref 0.8–1.2)
Prothrombin Time: 21.9 seconds — ABNORMAL HIGH (ref 11.4–15.2)

## 2022-01-22 LAB — PROTEIN, PLEURAL OR PERITONEAL FLUID: Total protein, fluid: <3 g/dL

## 2022-01-22 LAB — URINALYSIS, ROUTINE W REFLEX MICROSCOPIC
Bilirubin Urine: NEGATIVE
Glucose, UA: NEGATIVE mg/dL
Hgb urine dipstick: NEGATIVE
Ketones, ur: NEGATIVE mg/dL
Leukocytes,Ua: NEGATIVE
Nitrite: NEGATIVE
Protein, ur: NEGATIVE mg/dL
Specific Gravity, Urine: 1.004 — ABNORMAL LOW (ref 1.005–1.030)
pH: 9 — ABNORMAL HIGH (ref 5.0–8.0)

## 2022-01-22 LAB — CK: Total CK: 54 U/L (ref 38–234)

## 2022-01-22 LAB — LIPASE, BLOOD: Lipase: 40 U/L (ref 11–51)

## 2022-01-22 LAB — FOLATE: Folate: 14.2 ng/mL (ref >5.9)

## 2022-01-22 LAB — ETHANOL: Alcohol, Ethyl (B): 187 mg/dL — ABNORMAL HIGH (ref <10)

## 2022-01-22 LAB — MAGNESIUM: Magnesium: 1.4 mg/dL — ABNORMAL LOW (ref 1.7–2.4)

## 2022-01-22 LAB — TSH: TSH: 2.45 u[IU]/mL (ref 0.350–4.500)

## 2022-01-22 LAB — AMMONIA: Ammonia: 82 umol/L — ABNORMAL HIGH (ref 9–35)

## 2022-01-22 LAB — GLUCOSE, PLEURAL OR PERITONEAL FLUID: Glucose, Fluid: 127 mg/dL

## 2022-01-22 LAB — VITAMIN B12: Vitamin B-12: 1073 pg/mL — ABNORMAL HIGH (ref 180–914)

## 2022-01-22 LAB — BRAIN NATRIURETIC PEPTIDE: B Natriuretic Peptide: 12.5 pg/mL (ref 0.0–100.0)

## 2022-01-22 MED ORDER — MAGNESIUM SULFATE 2 GM/50ML IV SOLN
2.0000 g | Freq: Once | INTRAVENOUS | Status: AC
Start: 2022-01-22 — End: 2022-01-22
  Administered 2022-01-22: 2 g via INTRAVENOUS
  Filled 2022-01-22: qty 50

## 2022-01-22 MED ORDER — THIAMINE HCL 100 MG PO TABS
100.0000 mg | ORAL_TABLET | Freq: Every day | ORAL | Status: DC
  Administered 2022-01-22 – 2022-01-24 (×3): 100 mg via ORAL
  Filled 2022-01-22 (×3): qty 1

## 2022-01-22 MED ORDER — THIAMINE HCL 100 MG/ML IJ SOLN: 100.0000 mg | Freq: Every day | INTRAMUSCULAR | Status: DC

## 2022-01-22 MED ORDER — POTASSIUM CHLORIDE CRYS ER 20 MEQ PO TBCR
40.0000 meq | EXTENDED_RELEASE_TABLET | Freq: Once | ORAL | Status: AC
Start: 2022-01-22 — End: 2022-01-22
  Administered 2022-01-22: 40 meq via ORAL
  Filled 2022-01-22: qty 2

## 2022-01-22 MED ORDER — CLONAZEPAM 0.5 MG PO TABS
0.5000 mg | ORAL_TABLET | Freq: Two times a day (BID) | ORAL | Status: DC
Start: 2022-01-22 — End: 2022-01-23
  Administered 2022-01-22: 0.5 mg via ORAL
  Filled 2022-01-22: qty 1

## 2022-01-22 MED ORDER — LORAZEPAM 1 MG PO TABS: 1.0000 mg | ORAL_TABLET | ORAL | Status: DC | PRN

## 2022-01-22 MED ORDER — SODIUM CHLORIDE 0.9% FLUSH: 3.0000 mL | INTRAVENOUS | Status: DC | PRN

## 2022-01-22 MED ORDER — POTASSIUM CHLORIDE 10 MEQ/100ML IV SOLN
10.0000 meq | INTRAVENOUS | Status: AC
  Administered 2022-01-22 (×3): 10 meq via INTRAVENOUS
  Filled 2022-01-22 (×3): qty 100

## 2022-01-22 MED ORDER — LORAZEPAM 2 MG/ML IJ SOLN
1.0000 mg | INTRAMUSCULAR | Status: DC | PRN
  Filled 2022-01-22: qty 1

## 2022-01-22 MED ORDER — SODIUM CHLORIDE 0.9% FLUSH
3.0000 mL | Freq: Two times a day (BID) | INTRAVENOUS | Status: DC
  Administered 2022-01-22 – 2022-01-24 (×3): 3 mL via INTRAVENOUS

## 2022-01-22 MED ORDER — SODIUM CHLORIDE 0.9 % IV SOLN: 250.0000 mL | INTRAVENOUS | Status: DC | PRN

## 2022-01-22 MED ORDER — POTASSIUM CHLORIDE 10 MEQ/100ML IV SOLN
INTRAVENOUS | Status: AC
  Administered 2022-01-23: 10 meq
  Filled 2022-01-22: qty 100

## 2022-01-22 MED ORDER — ADULT MULTIVITAMIN W/MINERALS CH
1.0000 | ORAL_TABLET | Freq: Every day | ORAL | Status: DC
  Administered 2022-01-22 – 2022-01-24 (×3): 1 via ORAL
  Filled 2022-01-22 (×3): qty 1

## 2022-01-22 MED ORDER — FOLIC ACID 1 MG PO TABS
1.0000 mg | ORAL_TABLET | Freq: Every day | ORAL | Status: DC
  Administered 2022-01-22 – 2022-01-24 (×3): 1 mg via ORAL
  Filled 2022-01-22 (×3): qty 1

## 2022-01-22 MED ORDER — POTASSIUM PHOSPHATES 15 MMOLE/5ML IV SOLN
15.0000 mmol | Freq: Once | INTRAVENOUS | Status: AC
  Administered 2022-01-23: 15 mmol via INTRAVENOUS
  Filled 2022-01-22 (×2): qty 5

## 2022-01-22 MED ORDER — LIDOCAINE HCL 1 % IJ SOLN
INTRAMUSCULAR | Status: AC
  Administered 2022-01-22: 10 mL
  Filled 2022-01-22: qty 20

## 2022-01-22 MED ORDER — ALBUMIN HUMAN 5 % IV SOLN
12.5000 g | Freq: Once | INTRAVENOUS | Status: AC
  Administered 2022-01-23: 12.5 g via INTRAVENOUS
  Filled 2022-01-22: qty 250

## 2022-01-22 NOTE — Assessment & Plan Note (Signed)
We will replace ?

## 2022-01-22 NOTE — Procedures (Signed)
PROCEDURE SUMMARY: ? ?Successful US guided diagnostic and therapeutic paracentesis from RLQ.  ?Yielded 4 L of cloudy, yellow fluid.  ?No immediate complications.  ?Pt tolerated well.  ? ?Specimen was sent for labs. ? ?EBL < 1 mL ? ?Tyson Alias, AGNP ?01/22/2022 ?4:52 PM ? ? ?

## 2022-01-22 NOTE — Assessment & Plan Note (Signed)
Chronic possibly alcohol induced ?

## 2022-01-22 NOTE — Assessment & Plan Note (Signed)
Chronic resulting in cirrhosis order CIWA protocol importance of  quit drinking which has been explained to the patient ?

## 2022-01-22 NOTE — Assessment & Plan Note (Signed)
History of seizure disorder continue Keppra ?

## 2022-01-22 NOTE — Assessment & Plan Note (Signed)
Chronic patient has been seen in the past by hematology for this. ?

## 2022-01-22 NOTE — Assessment & Plan Note (Signed)
Chronic patient is followed up with hematology ?

## 2022-01-22 NOTE — Subjective & Objective (Signed)
Comes in with abdominal distention and leg edema her abdomen has been drained about 3 weeks ago and had 3.5 L taken out at that time ?Known history of cirrhosis ascites requiring paracentesis continues to drink alcohol ?Noted that her abdomen has been more swollen she is short of breath because of this also has been having increased leg swelling which causes pain. ?No associated chest pain no cough no fever ?She has been nauseous but not vomiting.  She was told last week a platelet count was low ?

## 2022-01-22 NOTE — ED Provider Notes (Signed)
?[facility] DEPT ?Provider Note ? ? ?CSN: [id number] ?Arrival date & time: 01/22/22  1458 ? ?  ? ?History ? ?Chief Complaint  ?Patient presents with  ? abdominal distention  ? Leg Swelling  ? ? ?Briana Sweeney is a 51 y.o. female. ? ?alcoholic liver cirrhosis who came into the emergency department with complaints of of abdominal distention abdominal pain due to ascites presenting with abdominal distention, legs pain or leg swelling.  Last paracentesis approximately 3 weeks ago.  Patient reports a history of alcoholic cirrhosis and continues to drink alcohol.  Also reports "pancreatic cancer" but is not getting chemotherapy currently.  She feels distended in her abdomen, short of breath, increased leg pain or leg swelling.  No chest pain, cough or fever.  Nausea but no vomiting.  No pain with urination or blood in the urine.  States her platelet count was low at last check several days ago. ? ?The history is provided by the patient and a relative.  ? ?  ? ?Home Medications ?Prior to Admission medications   ?Medication Sig Start Date End Date Taking? Authorizing Provider  ?ARIPiprazole (ABILIFY) 10 MG tablet Take 1 tablet by mouth every day ?Patient taking differently: Take 10 mg by mouth every morning. 11/22/21     ?clonazePAM (KLONOPIN) 0.5 MG tablet Take 1 tablet by mouth 2 times daily. 08/30/21     ?diclofenac Sodium (VOLTAREN) 1 % GEL Apply 1 application. topically at bedtime as needed (arthritis pain). ?Patient not taking: Reported on 12/15/2021    [provider]  ?FLUoxetine (PROZAC) 40 MG capsule Take 1 capsule (40 mg total) by mouth daily. ?Patient taking differently: Take 40 mg by mouth every morning. 11/22/21     ?folic acid (FOLVITE) 1 MG tablet Take 1 tablet (1 mg total) by mouth once daily ?Patient not taking: Reported on 12/15/2021 10/29/21     ?furosemide (LASIX) 40 MG tablet Take 1.5 tablets (60 mg total) by mouth daily. 12/16/21 01/15/22  Elodia Florence., MD   ?gabapentin (NEURONTIN) 300 MG capsule Take 300 mg by mouth 3 (three) times daily.    [provider]  ?lactulose (CHRONULAC) 10 GM/15ML solution Take 30 mLs by mouth 2 (two) times daily as needed for Constipation (take as needed to have goal 2-3 bowel movements daily) for up to 30 days ?Patient taking differently: Take 20 g by mouth 3 (three) times daily. 10/29/21     ?levETIRAcetam (KEPPRA) 750 MG tablet Take 1 tablet (750 mg total) by mouth 2 (two) times daily. 07/06/21   Cameron Sprang, MD  ?magnesium oxide (MAG-OX) 400 (240 Mg) MG tablet Take 1 tablet by mouth 2 (two) times daily. 12/27/21   [provider]  ?Multiple Vitamin (MULTIVITAMIN WITH MINERALS) TABS tablet Take 1 tablet by mouth daily.    [provider]  ?nadolol (CORGARD) 20 MG tablet Take 1 tablet by mouth daily ?Patient taking differently: Take 20 mg by mouth every morning. 12/10/21     ?pantoprazole (PROTONIX) 40 MG tablet Take 1 tablet (40 mg total) by mouth 2 (two) times daily before meals ?Patient taking differently: Take 40 mg by mouth every morning. 10/29/21     ?potassium chloride (KLOR-CON) 20 MEQ packet Take 20 mEq by mouth daily for 14 days. Follow repeat labs within 1-2 weeks. 12/16/21 12/30/21  Elodia Florence., MD  ?spironolactone (ALDACTONE) 100 MG tablet Take 1.5 tablets (150 mg total) by mouth daily. 12/16/21 01/15/22  Elodia Florence.,  MD  ?   ? ?Allergies    ?Other   ? ?Review of Systems   ?Review of Systems  ?Constitutional:  Negative for activity change, appetite change and fever.  ?Respiratory:  Positive for shortness of breath. Negative for chest tightness.   ?Cardiovascular:  Negative for chest pain.  ?Gastrointestinal:  Positive for abdominal distention, abdominal pain and nausea. Negative for diarrhea and vomiting.  ?Genitourinary:  Negative for dysuria and hematuria.  ?Musculoskeletal:  Negative for arthralgias and myalgias.  ?Skin:  Negative for rash.  ?Neurological:  Negative for dizziness,  weakness and headaches.  ? all other systems are negative except as noted in the HPI and PMH.  ? ?Physical Exam ?Updated Vital Signs ?BP (!) 120/93 (BP Location: Right Arm)   Pulse (!) 105   Temp 98.2 ?F (36.8 ?C) (Oral)   Resp 18   Ht '4\' 11"'$  (1.499 m)   Wt 68.5 kg   SpO2 (!) 89%   BMI 30.50 kg/m?  ?Physical Exam ?Vitals and nursing note reviewed.  ?Constitutional:   ?   General: She is not in acute distress. ?   Appearance: She is well-developed.  ?HENT:  ?   Head: Normocephalic and atraumatic.  ?   Mouth/Throat:  ?   Pharynx: No oropharyngeal exudate.  ?Eyes:  ?   Conjunctiva/sclera: Conjunctivae normal.  ?   Pupils: Pupils are equal, round, and reactive to light.  ?Neck:  ?   Comments: No meningismus. ?Cardiovascular:  ?   Rate and Rhythm: Normal rate and regular rhythm.  ?   Heart sounds: Normal heart sounds. No murmur heard. ?Pulmonary:  ?   Effort: Pulmonary effort is normal. No respiratory distress.  ?   Breath sounds: Normal breath sounds.  ?Abdominal:  ?   General: There is distension.  ?   Palpations: Abdomen is soft.  ?   Tenderness: There is abdominal tenderness. There is no guarding or rebound.  ?   Comments: Diffuse abdominal distention.  No guarding or rebound.  Soft  ?Musculoskeletal:     ?   General: No tenderness. Normal range of motion.  ?   Cervical back: Normal range of motion and neck supple.  ?   Right lower leg: Edema present.  ?   Left lower leg: Edema present.  ?Skin: ?   General: Skin is warm.  ?Neurological:  ?   Mental Status: She is alert and oriented to person, place, and time.  ?   Cranial Nerves: No cranial nerve deficit.  ?   Motor: No abnormal muscle tone.  ?   Coordination: Coordination normal.  ?   Comments:  5/5 strength throughout. CN 2-12 intact.Equal grip strength.   ?Psychiatric:     ?   Behavior: Behavior normal.  ? ? ?ED Results / Procedures / Treatments   ?Labs ?(all labs ordered are listed, but only abnormal results are displayed) ?Labs Reviewed  ?CBC WITH  DIFFERENTIAL/PLATELET - Abnormal; Notable for the following components:  ?    Result Value  ? WBC 2.9 (*)   ? RBC 3.42 (*)   ? Hemoglobin 8.5 (*)   ? HCT 26.2 (*)   ? MCV 76.6 (*)   ? MCH 24.9 (*)   ? RDW 27.2 (*)   ? Platelets 31 (*)   ? Neutro Abs 1.1 (*)   ? All other components within normal limits  ?COMPREHENSIVE METABOLIC PANEL - Abnormal; Notable for the following components:  ? Sodium 134 (*)   ?  Potassium 2.5 (*)   ? Chloride 97 (*)   ? Glucose, Bld 113 (*)   ? BUN <5 (*)   ? Calcium 7.8 (*)   ? Albumin 2.4 (*)   ? AST 82 (*)   ? Alkaline Phosphatase 227 (*)   ? Total Bilirubin 7.1 (*)   ? All other components within normal limits  ?PROTIME-INR - Abnormal; Notable for the following components:  ? Prothrombin Time 21.9 (*)   ? INR 1.9 (*)   ? All other components within normal limits  ?URINALYSIS, ROUTINE W REFLEX MICROSCOPIC - Abnormal; Notable for the following components:  ? Specific Gravity, Urine 1.004 (*)   ? pH 9.0 (*)   ? All other components within normal limits  ?AMMONIA - Abnormal; Notable for the following components:  ? Ammonia 82 (*)   ? All other components within normal limits  ?ETHANOL - Abnormal; Notable for the following components:  ? Alcohol, Ethyl (B) 187 (*)   ? All other components within normal limits  ?LACTATE DEHYDROGENASE, PLEURAL OR PERITONEAL FLUID - Abnormal; Notable for the following components:  ? LD, Fluid 36 (*)   ? All other components within normal limits  ?BODY FLUID CELL COUNT WITH DIFFERENTIAL - Abnormal; Notable for the following components:  ? Appearance, Fluid HAZY (*)   ? All other components within normal limits  ?MAGNESIUM - Abnormal; Notable for the following components:  ? Magnesium 1.4 (*)   ? All other components within normal limits  ?BODY FLUID CULTURE W GRAM STAIN  ?LIPASE, BLOOD  ?BRAIN NATRIURETIC PEPTIDE  ?PROTEIN, PLEURAL OR PERITONEAL FLUID  ?GLUCOSE, PLEURAL OR PERITONEAL FLUID  ?PATHOLOGIST SMEAR REVIEW  ?BLOOD GAS, VENOUS  ?CK  ?CREATININE, URINE,  RANDOM  ?PHOSPHORUS  ?PREALBUMIN  ?SODIUM, URINE, RANDOM  ?TSH  ?VITAMIN B12  ?FOLATE  ?IRON AND TIBC  ?FERRITIN  ?RETICULOCYTES  ?BASIC METABOLIC PANEL  ? ? ?EKG ?EKG Interpretation ? ?Date/Time:  Tuesday Jan 22 2022

## 2022-01-22 NOTE — ED Triage Notes (Signed)
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 ? ?Patient has abdominal distention and lower extremity edema. ?Patient reports that she had her abdomen drained 3 weeks ago and had 3.5 liters withdrawn. ?

## 2022-01-22 NOTE — ED Notes (Signed)
Peripheral IV meds waiting on IV consult.  ?

## 2022-01-22 NOTE — H&P (Signed)
? ? ?Briana Sweeney VOJ:[id number] DOB: 11-26-1970 DOA: 01/22/2022 ? ? ?  ?PCP: Marco Collie, MD   ?Outpatient Specialists:  ?  ? Oncology Dequincy Macarthur Critchley, MD   ?  ? ?Patient arrived to ER on 01/22/22 at 1458 ?Referred by Attending Ezequiel Essex, MD ? ? ?Patient coming from:   ? home Lives  With SO ?  ? ?Chief Complaint:   ?Chief Complaint  ?Patient presents with  ? abdominal distention  ? Leg Swelling  ? ? ?HPI: ?Briana Sweeney is a 51 y.o. 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 ? Alcohol abuse, hx of pancreatic mass not treated ? ?Presented with  abdominal distention ? Comes in with abdominal distention and leg edema her abdomen has been drained about 3 weeks ago and had 3.5 L taken out at that time ?Known history of cirrhosis ascites requiring paracentesis continues to drink alcohol ?Noted that her abdomen has been more swollen she is short of breath because of this also has been having increased leg swelling which causes pain. ?No associated chest pain no cough no fever ?She has been nauseous but not vomiting.  She was told last week a platelet count was low ?    ?Pt states she only drinks one beer a day does not smoke ?Spoke at length regarding danger of continuing to drink ? ?Regarding pertinent Chronic problems:   ?ITP  She has chronically been receiving weekly Nplate so we will keep her platelets at a suitable level ?  ? ? Liver disease MELD-Na score: 23 at 01/22/2022   ?  in April She had severe hematemesis to where she had to be airlifted to [facility].  While there, she had her esophageal varices banded. ? the patient has started with mortality.  She had severe hematemesis to where she had to be airlifted to [facility].  While there, she had her esophageal varices banded. ?The patient has undergone 2 paracenteses over these past few weeks, including a few days ago when 2.3 liters of  ascites were removed.  ?  ? ?  Chronic anemia - baseline hg Hemoglobin & Hematocrit  ?Recent Labs  ?  01/07/22 ?0000 01/21/22 ?0000 01/22/22 ?1726  ?HGB 8.7* 8.1* 8.5*  ? ? ? ?While in ER: ?Noted to be persistently pancytopenic.  Platelets down to 41 potassium also down to 2.5 albumin 2.4 INR 1.9 ammonia up to 82 ?Still has alcohol in her blood  ? ?Ultrasound-guided paracentesis was performed ?Did not show any evidence of SBP ?  ? ?CXR -  NON acute ?  ? ?Following Medications were ordered in ER: ?Medications  ?potassium chloride 10 mEq in 100 mL IVPB (has no administration in time range)  ?lidocaine (XYLOCAINE) 1 % (with pres) injection (10 mLs  Given 01/22/22 1709)  ?potassium chloride SA (KLOR-CON M) CR tablet 40 mEq (40 mEq Oral Given 01/22/22 1835)  ?   ?  ?ED Triage Vitals  ?Enc Vitals Group  ?   BP 01/22/22 1510 (!) 135/94  ?   Pulse Rate 01/22/22 1510 (!) 120  ?   Resp 01/22/22 1510 18  ?   Temp 01/22/22 1510 98.2 ?F (36.8 ?C)  ?   Temp Source 01/22/22 1510 Oral  ?   SpO2 01/22/22 1510 94 %  ?   Weight 01/22/22 1538 151 lb (68.5 kg)  ?   Height 01/22/22 1538 '4\' 11"'$  (1.499 m)  ?  Head Circumference --   ?   Peak Flow --   ?   Pain Score 01/22/22 1538 9  ?   Pain Loc --   ?   Pain Edu? --   ?   Excl. in [place]? --   ?NOMV(67)@    ? _________________________________________ ?Significant initial  Findings: ?Abnormal Labs Reviewed  ?CBC WITH DIFFERENTIAL/PLATELET - Abnormal; Notable for the following components:  ?    Result Value  ? WBC 2.9 (*)   ? RBC 3.42 (*)   ? Hemoglobin 8.5 (*)   ? HCT 26.2 (*)   ? MCV 76.6 (*)   ? MCH 24.9 (*)   ? RDW 27.2 (*)   ? Platelets 31 (*)   ? Neutro Abs 1.1 (*)   ? All other components within normal limits  ?COMPREHENSIVE METABOLIC PANEL - Abnormal; Notable for the following components:  ? Sodium 134 (*)   ? Potassium 2.5 (*)   ? Chloride 97 (*)   ? Glucose, Bld 113 (*)   ? BUN <5 (*)   ? Calcium 7.8 (*)   ? Albumin 2.4 (*)   ? AST 82 (*)   ? Alkaline Phosphatase 227 (*)   ? Total  Bilirubin 7.1 (*)   ? All other components within normal limits  ?PROTIME-INR - Abnormal; Notable for the following components:  ? Prothrombin Time 21.9 (*)   ? INR 1.9 (*)   ? All other components within normal limits  ?URINALYSIS, ROUTINE W REFLEX MICROSCOPIC - Abnormal; Notable for the following components:  ? Specific Gravity, Urine 1.004 (*)   ? pH 9.0 (*)   ? All other components within normal limits  ?AMMONIA - Abnormal; Notable for the following components:  ? Ammonia 82 (*)   ? All other components within normal limits  ?ETHANOL - Abnormal; Notable for the following components:  ? Alcohol, Ethyl (B) 187 (*)   ? All other components within normal limits  ?LACTATE DEHYDROGENASE, PLEURAL OR PERITONEAL FLUID - Abnormal; Notable for the following components:  ? LD, Fluid 36 (*)   ? All other components within normal limits  ?BODY FLUID CELL COUNT WITH DIFFERENTIAL - Abnormal; Notable for the following components:  ? Appearance, Fluid HAZY (*)   ? All other components within normal limits  ?MAGNESIUM - Abnormal; Notable for the following components:  ? Magnesium 1.4 (*)   ? All other components within normal limits  ?BLOOD GAS, VENOUS - Abnormal; Notable for the following components:  ? pH, Ven 7.49 (*)   ? pO2, Ven <31 (*)   ? Bicarbonate 33.5 (*)   ? Acid-Base Excess 9.0 (*)   ? All other components within normal limits  ?RETICULOCYTES - Abnormal; Notable for the following components:  ? RBC. 1.80 (*)   ? All other components within normal limits  ? ?   ?ECG: Ordered ?Personally reviewed by me showing: ?HR : 99 ?Rhythm:  Sinus rhythm Borderline prolonged QT interval ?QTC 493 ? ?  ?The recent clinical data is shown below. ?Vitals:  ? 01/22/22 1636 01/22/22 1645 01/22/22 1815 01/22/22 1845  ?BP: 118/84 111/78 (!) 131/98 121/90  ?Pulse:   94 95  ?Resp:   (!) 23 19  ?Temp:      ?TempSrc:      ?SpO2:   98% 94%  ?Weight:      ?Height:      ?  ?  ?WBC ? ?   ?Component Value Date/Time  ? WBC 2.9 (  L) 01/22/2022 1726  ?  LYMPHSABS 1.0 01/22/2022 1726  ? LYMPHSABS 0.9 01/04/2020 1510  ? MONOABS 0.7 01/22/2022 1726  ? EOSABS 0.1 01/22/2022 1726  ? EOSABS 0.1 01/04/2020 1510  ? BASOSABS 0.0 01/22/2022 1726  ? BASOSABS 0.0 01/04/2020 1510  ?  ? ? UA   no evidence of UTI    ?  ?Urine analysis: ?   ?Component Value Date/Time  ? [place] 01/22/2022 1815  ? APPEARANCEUR CLEAR 01/22/2022 1815  ? LABSPEC 1.004 (L) 01/22/2022 1815  ? PHURINE 9.0 (H) 01/22/2022 1815  ? GLUCOSEU NEGATIVE 01/22/2022 1815  ? [place] NEGATIVE 01/22/2022 1815  ? [place] NEGATIVE 01/22/2022 1815  ? BILIRUBINUR NEG 05/26/2020 1544  ? [place] NEGATIVE 01/22/2022 1815  ? PROTEINUR NEGATIVE 01/22/2022 1815  ? UROBILINOGEN 1.0 05/26/2020 1544  ? UROBILINOGEN 0.2 12/02/2017 0836  ? NITRITE NEGATIVE 01/22/2022 1815  ? LEUKOCYTESUR NEGATIVE 01/22/2022 1815  ? ? ?Results for orders placed or performed during the hospital encounter of 12/15/21  ?Body fluid culture w Gram Stain     Status: None  ? Collection Time: 12/15/21 11:33 AM  ? Specimen: PATH Cytology Peritoneal fluid  ?Result Value Ref Range Status  ? Specimen Description   Final  ?  PERITONEAL ?Performed at [facility], [place] [address], [place], [place] [address] ?  ? Special Requests   Final  ?  NONE ?Performed at [facility], [employer] [address] [place], [place] [address] ?  ? Gram Stain CYTOSPIN SMEAR ?FEW WBC SEEN ?NO ORGANISMS SEEN ?  Final  ? Culture   Final  ?  NO GROWTH 3 DAYS ?Performed at [facility], [place] [address] [place], [place] [address] ?  ? Report Status 12/19/2021 FINAL  Final  ? ?*Note: Due to a large number of results and/or encounters for the requested time period, some results have not been displayed. A complete set of results can be found in Results Review.  ? ? ? ?_______________________________________________ ?Hospitalist was called for admission for liver failure electrolyte abnormalities ? ? ?The following Work up has been ordered so  far: ? ?Orders Placed This Encounter  ?Procedures  ? Body fluid culture w Gram Stain  ? DG Chest Portable 1 View  ? US Paracentesis  ? CBC with Differential  ? Comprehensive metabolic panel  ? Lipase, blood  ?

## 2022-01-22 NOTE — Assessment & Plan Note (Signed)
Replace and recheck potassium levels  ?Recheck magnesium after replacement  ? ?

## 2022-01-22 NOTE — Assessment & Plan Note (Signed)
Patient not a candidate for liver transplant given ongoing alcohol abuse. ?Overall very poor prognosis palliative care consult ?Life expectancy months ?MELD-Na score: 23 at 01/22/2022  ?

## 2022-01-22 NOTE — Assessment & Plan Note (Signed)
Undergone another paracentesis today no evidence of SBP will administer albumin to avoid renal complications, continue Lasix and spironolactone ?Patient continues to drink alcohol overall very poor prognosis order palliative care consult ?

## 2022-01-22 NOTE — Assessment & Plan Note (Signed)
Chronic combination of cirrhosis and ITP.  Somewhat up from baseline no indication for transfusion is currently not bleeding ?

## 2022-01-22 NOTE — Assessment & Plan Note (Signed)
Continue beta blocker. 

## 2022-01-22 NOTE — Assessment & Plan Note (Signed)
Replace and recheck 

## 2022-01-22 NOTE — Assessment & Plan Note (Signed)
Order prealbumin nutritional consult ?

## 2022-01-23 ENCOUNTER — Observation Stay (HOSPITAL_COMMUNITY): Payer: Commercial Managed Care - HMO

## 2022-01-23 ENCOUNTER — Inpatient Hospital Stay: Payer: Commercial Managed Care - HMO

## 2022-01-23 DIAGNOSIS — I8500 Esophageal varices without bleeding: Secondary | ICD-10-CM | POA: Diagnosis not present

## 2022-01-23 DIAGNOSIS — R569 Unspecified convulsions: Secondary | ICD-10-CM

## 2022-01-23 DIAGNOSIS — D693 Immune thrombocytopenic purpura: Secondary | ICD-10-CM | POA: Diagnosis present

## 2022-01-23 DIAGNOSIS — M7989 Other specified soft tissue disorders: Secondary | ICD-10-CM | POA: Diagnosis present

## 2022-01-23 DIAGNOSIS — F1020 Alcohol dependence, uncomplicated: Secondary | ICD-10-CM

## 2022-01-23 DIAGNOSIS — Z7189 Other specified counseling: Secondary | ICD-10-CM

## 2022-01-23 DIAGNOSIS — K219 Gastro-esophageal reflux disease without esophagitis: Secondary | ICD-10-CM | POA: Diagnosis present

## 2022-01-23 DIAGNOSIS — E876 Hypokalemia: Secondary | ICD-10-CM | POA: Diagnosis present

## 2022-01-23 DIAGNOSIS — D61818 Other pancytopenia: Secondary | ICD-10-CM

## 2022-01-23 DIAGNOSIS — Z808 Family history of malignant neoplasm of other organs or systems: Secondary | ICD-10-CM | POA: Diagnosis not present

## 2022-01-23 DIAGNOSIS — Z515 Encounter for palliative care: Secondary | ICD-10-CM | POA: Diagnosis not present

## 2022-01-23 DIAGNOSIS — K7210 Chronic hepatic failure without coma: Secondary | ICD-10-CM | POA: Diagnosis not present

## 2022-01-23 DIAGNOSIS — I8510 Secondary esophageal varices without bleeding: Secondary | ICD-10-CM | POA: Diagnosis present

## 2022-01-23 DIAGNOSIS — K766 Portal hypertension: Secondary | ICD-10-CM | POA: Diagnosis present

## 2022-01-23 DIAGNOSIS — D6959 Other secondary thrombocytopenia: Secondary | ICD-10-CM | POA: Diagnosis present

## 2022-01-23 DIAGNOSIS — G40909 Epilepsy, unspecified, not intractable, without status epilepticus: Secondary | ICD-10-CM | POA: Diagnosis present

## 2022-01-23 DIAGNOSIS — E441 Mild protein-calorie malnutrition: Secondary | ICD-10-CM | POA: Diagnosis present

## 2022-01-23 DIAGNOSIS — E871 Hypo-osmolality and hyponatremia: Secondary | ICD-10-CM | POA: Diagnosis present

## 2022-01-23 DIAGNOSIS — K7031 Alcoholic cirrhosis of liver with ascites: Secondary | ICD-10-CM

## 2022-01-23 DIAGNOSIS — K7040 Alcoholic hepatic failure without coma: Secondary | ICD-10-CM | POA: Diagnosis present

## 2022-01-23 DIAGNOSIS — K7460 Unspecified cirrhosis of liver: Secondary | ICD-10-CM | POA: Diagnosis present

## 2022-01-23 DIAGNOSIS — K7682 Hepatic encephalopathy: Secondary | ICD-10-CM | POA: Diagnosis present

## 2022-01-23 DIAGNOSIS — B182 Chronic viral hepatitis C: Secondary | ICD-10-CM | POA: Diagnosis present

## 2022-01-23 DIAGNOSIS — Z801 Family history of malignant neoplasm of trachea, bronchus and lung: Secondary | ICD-10-CM | POA: Diagnosis not present

## 2022-01-23 DIAGNOSIS — F209 Schizophrenia, unspecified: Secondary | ICD-10-CM | POA: Diagnosis present

## 2022-01-23 LAB — BASIC METABOLIC PANEL
Anion gap: 11 (ref 5–15)
BUN: <5 mg/dL — ABNORMAL LOW (ref 6–20)
CO2: 24 mmol/L (ref 22–32)
Calcium: 7.5 mg/dL — ABNORMAL LOW (ref 8.9–10.3)
Chloride: 100 mmol/L (ref 98–111)
Creatinine, Ser: 0.51 mg/dL (ref 0.44–1.00)
GFR, Estimated: >60 mL/min (ref >60)
Glucose, Bld: 131 mg/dL — ABNORMAL HIGH (ref 70–99)
Potassium: 3.3 mmol/L — ABNORMAL LOW (ref 3.5–5.1)
Sodium: 135 mmol/L (ref 135–145)

## 2022-01-23 LAB — COMPREHENSIVE METABOLIC PANEL
ALT: 19 U/L (ref 0–44)
AST: 80 U/L — ABNORMAL HIGH (ref 15–41)
Albumin: 2.4 g/dL — ABNORMAL LOW (ref 3.5–5.0)
Alkaline Phosphatase: 220 U/L — ABNORMAL HIGH (ref 38–126)
Anion gap: 11 (ref 5–15)
BUN: <5 mg/dL — ABNORMAL LOW (ref 6–20)
CO2: 24 mmol/L (ref 22–32)
Calcium: 7.5 mg/dL — ABNORMAL LOW (ref 8.9–10.3)
Chloride: 100 mmol/L (ref 98–111)
Creatinine, Ser: 0.53 mg/dL (ref 0.44–1.00)
GFR, Estimated: >60 mL/min (ref >60)
Glucose, Bld: 92 mg/dL (ref 70–99)
Potassium: 3.5 mmol/L (ref 3.5–5.1)
Sodium: 135 mmol/L (ref 135–145)
Total Bilirubin: 7.2 mg/dL — ABNORMAL HIGH (ref 0.3–1.2)
Total Protein: 6.5 g/dL (ref 6.5–8.1)

## 2022-01-23 LAB — PHOSPHORUS
Phosphorus: 1.8 mg/dL — ABNORMAL LOW (ref 2.5–4.6)
Phosphorus: 2 mg/dL — ABNORMAL LOW (ref 2.5–4.6)

## 2022-01-23 LAB — CBC
HCT: 24.3 % — ABNORMAL LOW (ref 36.0–46.0)
Hemoglobin: 7.8 g/dL — ABNORMAL LOW (ref 12.0–15.0)
MCH: 24.3 pg — ABNORMAL LOW (ref 26.0–34.0)
MCHC: 32.1 g/dL (ref 30.0–36.0)
MCV: 75.7 fL — ABNORMAL LOW (ref 80.0–100.0)
Platelets: 21 10*3/uL — CL (ref 150–400)
RBC: 3.21 MIL/uL — ABNORMAL LOW (ref 3.87–5.11)
RDW: 27.1 % — ABNORMAL HIGH (ref 11.5–15.5)
WBC: 2.3 10*3/uL — ABNORMAL LOW (ref 4.0–10.5)
nRBC: 0 % (ref 0.0–0.2)

## 2022-01-23 LAB — PROTIME-INR
INR: 2.2 — ABNORMAL HIGH (ref 0.8–1.2)
Prothrombin Time: 24.4 seconds — ABNORMAL HIGH (ref 11.4–15.2)

## 2022-01-23 LAB — MAGNESIUM
Magnesium: 1.8 mg/dL (ref 1.7–2.4)
Magnesium: 1.9 mg/dL (ref 1.7–2.4)

## 2022-01-23 LAB — PREALBUMIN: Prealbumin: <5 mg/dL — ABNORMAL LOW (ref 18–38)

## 2022-01-23 LAB — AMMONIA: Ammonia: 133 umol/L — ABNORMAL HIGH (ref 9–35)

## 2022-01-23 MED ORDER — ACETAMINOPHEN 650 MG RE SUPP: 650.0000 mg | Freq: Four times a day (QID) | RECTAL | Status: DC | PRN

## 2022-01-23 MED ORDER — NADOLOL 20 MG PO TABS
20.0000 mg | ORAL_TABLET | Freq: Every morning | ORAL | Status: DC
  Administered 2022-01-23 – 2022-01-24 (×2): 20 mg via ORAL
  Filled 2022-01-23 (×2): qty 1

## 2022-01-23 MED ORDER — POTASSIUM CHLORIDE 10 MEQ/100ML IV SOLN
10.0000 meq | INTRAVENOUS | Status: AC
  Administered 2022-01-23 (×2): 10 meq via INTRAVENOUS
  Filled 2022-01-23 (×2): qty 100

## 2022-01-23 MED ORDER — MAGNESIUM OXIDE -MG SUPPLEMENT 400 (240 MG) MG PO TABS
400.0000 mg | ORAL_TABLET | Freq: Two times a day (BID) | ORAL | Status: DC
  Administered 2022-01-23 – 2022-01-24 (×4): 400 mg via ORAL
  Filled 2022-01-23 (×4): qty 1

## 2022-01-23 MED ORDER — ACETAMINOPHEN 325 MG PO TABS: 650.0000 mg | ORAL_TABLET | Freq: Four times a day (QID) | ORAL | Status: DC | PRN

## 2022-01-23 MED ORDER — SODIUM CHLORIDE 0.9% FLUSH: 3.0000 mL | INTRAVENOUS | Status: DC | PRN

## 2022-01-23 MED ORDER — ONDANSETRON HCL 4 MG/2ML IJ SOLN
4.0000 mg | Freq: Four times a day (QID) | INTRAMUSCULAR | Status: DC | PRN
  Administered 2022-01-23: 4 mg via INTRAVENOUS
  Filled 2022-01-23: qty 2

## 2022-01-23 MED ORDER — LEVETIRACETAM 500 MG PO TABS
750.0000 mg | ORAL_TABLET | Freq: Two times a day (BID) | ORAL | Status: DC
  Administered 2022-01-23 – 2022-01-24 (×4): 750 mg via ORAL
  Filled 2022-01-23 (×4): qty 1

## 2022-01-23 MED ORDER — CLONAZEPAM 0.5 MG PO TABS
0.5000 mg | ORAL_TABLET | Freq: Two times a day (BID) | ORAL | Status: DC | PRN
  Administered 2022-01-23 (×2): 0.5 mg via ORAL
  Filled 2022-01-23 (×2): qty 1

## 2022-01-23 MED ORDER — ARIPIPRAZOLE 10 MG PO TABS
10.0000 mg | ORAL_TABLET | Freq: Every morning | ORAL | Status: DC
  Administered 2022-01-23 – 2022-01-24 (×2): 10 mg via ORAL
  Filled 2022-01-23 (×2): qty 1

## 2022-01-23 MED ORDER — SODIUM CHLORIDE 0.9% FLUSH: 3.0000 mL | Freq: Two times a day (BID) | INTRAVENOUS | Status: DC

## 2022-01-23 MED ORDER — LACTULOSE 10 GM/15ML PO SOLN
20.0000 g | Freq: Three times a day (TID) | ORAL | Status: DC
  Administered 2022-01-23 – 2022-01-24 (×4): 20 g via ORAL
  Filled 2022-01-23 (×4): qty 30

## 2022-01-23 MED ORDER — FUROSEMIDE 40 MG PO TABS
60.0000 mg | ORAL_TABLET | Freq: Every day | ORAL | Status: DC
  Administered 2022-01-23 – 2022-01-24 (×2): 60 mg via ORAL
  Filled 2022-01-23 (×2): qty 1

## 2022-01-23 MED ORDER — IBUPROFEN 200 MG PO TABS
400.0000 mg | ORAL_TABLET | Freq: Four times a day (QID) | ORAL | Status: DC | PRN
  Administered 2022-01-23: 400 mg via ORAL
  Filled 2022-01-23: qty 2

## 2022-01-23 MED ORDER — ENSURE ENLIVE PO LIQD
237.0000 mL | Freq: Two times a day (BID) | ORAL | Status: DC
  Administered 2022-01-23 – 2022-01-24 (×2): 237 mL via ORAL

## 2022-01-23 MED ORDER — FLUOXETINE HCL 20 MG PO CAPS
40.0000 mg | ORAL_CAPSULE | Freq: Every morning | ORAL | Status: DC
  Administered 2022-01-23 – 2022-01-24 (×2): 40 mg via ORAL
  Filled 2022-01-23 (×2): qty 2

## 2022-01-23 MED ORDER — SPIRONOLACTONE 25 MG PO TABS
150.0000 mg | ORAL_TABLET | Freq: Every day | ORAL | Status: DC
Start: 2022-01-23 — End: 2022-01-24
  Administered 2022-01-23 – 2022-01-24 (×2): 150 mg via ORAL
  Filled 2022-01-23 (×2): qty 6

## 2022-01-23 MED ORDER — ROMIPLOSTIM INJECTION 500 MCG
4.0000 ug/kg | SUBCUTANEOUS | Status: DC
  Administered 2022-01-23: 275 ug via SUBCUTANEOUS
  Filled 2022-01-23: qty 0.55

## 2022-01-23 MED ORDER — HYDROCODONE-ACETAMINOPHEN 5-325 MG PO TABS: 1.0000 | ORAL_TABLET | ORAL | Status: DC | PRN

## 2022-01-23 MED ORDER — POTASSIUM CHLORIDE 20 MEQ PO PACK
20.0000 meq | PACK | Freq: Every day | ORAL | Status: DC
  Administered 2022-01-23 – 2022-01-24 (×2): 20 meq via ORAL
  Filled 2022-01-23 (×2): qty 1

## 2022-01-23 MED ORDER — GABAPENTIN 300 MG PO CAPS: 300.0000 mg | ORAL_CAPSULE | Freq: Three times a day (TID) | ORAL | Status: DC

## 2022-01-23 MED ORDER — PANTOPRAZOLE SODIUM 40 MG PO TBEC
40.0000 mg | DELAYED_RELEASE_TABLET | Freq: Every morning | ORAL | Status: DC
  Administered 2022-01-23 – 2022-01-24 (×2): 40 mg via ORAL
  Filled 2022-01-23 (×2): qty 1

## 2022-01-23 MED ORDER — ALBUMIN HUMAN 5 % IV SOLN
25.0000 g | Freq: Once | INTRAVENOUS | Status: DC
  Filled 2022-01-23: qty 500

## 2022-01-23 MED ORDER — SODIUM CHLORIDE 0.9 % IV SOLN: 250.0000 mL | INTRAVENOUS | Status: DC | PRN

## 2022-01-23 NOTE — Progress Notes (Signed)
Brief Hematology/Oncology Progress Note ? 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  Patient currently follows with Dr. [name] at the [facility] in [place]. ? ?Interval History: ?-- Patient underwent an ultrasound paracentesis removing 4 L of peritoneal fluid.  ?--Labs today show white blood cell count 2.3, hemoglobin 7.8, and platelets of 21 ?--Patiently usually undergoes romiplostim injections 4 mcg/kg every week. She reports her last dose was last week, though records appear to show 4/26 as last dose. Due today.  ?--on exam patient notes no overt signs of bleeding or bruising ?--reports feeling better after paracentesis.  ? ?O: ? ?Vitals:  ? 01/23/22 0903 01/23/22 1358  ?BP:  113/83  ?Pulse:  91  ?Resp:  20  ?Temp: 98.7 ?F (37.1 ?C) 98.3 ?F (36.8 ?C)  ?SpO2:  95%  ? ? ?  Latest Ref Rng & Units 01/23/2022  ?  5:08 AM 01/23/2022  ? 12:00 AM 01/22/2022  ?  5:26 PM  ?CMP  ?Glucose 70 - 99 mg/dL 92   131   113    ?BUN 6 - 20 mg/dL <5   <5   <5    ?Creatinine 0.44 - 1.00 mg/dL 0.53   0.51   0.57    ?Sodium 135 - 145 mmol/L 135   135   134    ?Potassium 3.5 - 5.1 mmol/L 3.5   3.3   2.5    ?Chloride 98 - 111 mmol/L 100   100   97    ?CO2 22 - 32 mmol/L '24   24   29    '$ ?Calcium 8.9 - 10.3 mg/dL 7.5   7.5   7.8    ?Total Protein 6.5 - 8.1 g/dL 6.5    6.8    ?Total Bilirubin 0.3 - 1.2 mg/dL 7.2    7.1    ?Alkaline Phos 38 - 126 U/L 220    227    ?AST 15 - 41 U/L 80    82    ?ALT 0 - 44 U/L 19    22    ? ? ?  Latest Ref Rng & Units 01/23/2022  ?  5:08 AM 01/22/2022  ?  5:26 PM 01/21/2022  ? 12:00 AM  ?CBC  ?WBC 4.0 - 10.5 K/uL 2.3   2.9   2.8       ?Hemoglobin 12.0 - 15.0 g/dL 7.8   8.5   8.1       ?Hematocrit 36.0 - 46.0 % 24.3   26.2   26       ?Platelets 150 - 400 K/uL '21   31   27       '$ ?  ? This result is from an external source.  ?  ? ? ?GENERAL:  chronically ill appearing middle age 67 female in NAD  ?SKIN: skin color, texture, turgor are normal, no rashes or significant lesions ?EYES: conjunctiva are pink and non-injected, sclera clear ?LUNGS: clear to auscultation and percussion with normal breathing effort ?HEART: regular rate & rhythm and no murmurs and no lower extremity edema ?Musculoskeletal: no cyanosis of digits and no clubbing  ?PSYCH: alert & oriented x 3, fluent speech ?NEURO: no focal motor/sensory deficits ? 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 ? ?# Chronic ITP/Cirrhosis ?# Thrombocytopenia ?-- Today we will administer 93mg/kg of romiplostim.  We will schedule for weekly dosing.  Encourage patient to continue this in the outpatient setting with Dr. LBobby Rumpf?--Recommend monitoring daily platelets and CBC ?--Recommend transfusion of platelets if platelet count less than 10 or if patient is bleeding with platelets of less than 20 ?--Other lab counts are currently at the patient's baseline ?--Management of decompensated cirrhosis per primary team ?--Hematology will continue to follow peripherally.  Assure follow-up with Dr. LBobby Rumpfupon discharge ? ? ?JLedell Peoples MD ?Department of Hematology/Oncology ?[facility]?Phone: [phone]?Pager: [phone]?Email: [email or web address] ? ?

## 2022-01-23 NOTE — Progress Notes (Signed)
Patient ID: Briana Sweeney, female   DOB: [birth year], 51 y.o.   MRN: [id number] ?Request received for repeat paracentesis on pt who underwent paracentesis yesterday with 1.4 liters removed. On limited US abd exam today there is only minimal ascites noted. Procedure cancelled.  ?

## 2022-01-23 NOTE — Evaluation (Signed)
Occupational Therapy Evaluation ?Patient Details ?Name: Briana Sweeney ?MRN: [id number] ?DOB: 31-Jan-1971 ?Today's Date: 01/23/2022 ? ? ?History of Present Illness Patient is a 51 year old female who was admitted with liver failure without hepatic coma. patient underwent paracentesis on 5/16 with 4L removed.  PMH: bipolar/schizophrenia, EtOH abuse with cirrhosis, seizure disorder,  ? ?Clinical Impression ?  ?Patient is a 51 year old female who was admitted for above. Patient's eval was limited by nausea and actual production of emesis during grooming tasks at sink. Patient was noted to have decreased activity tolerance, decreased endurance, decreased standing balance and safety awareness impacting participation in Adls. Anticipate that patient will be able to progress to not need OT follow up in next level of care. Patient would continue to benefit from skilled OT services at this time while admitted d/c to address noted deficits in order to improve overall safety and independence in ADLs.  ? ?   ? ?Recommendations for follow up therapy are one component of a multi-disciplinary discharge planning process, led by the attending physician.  Recommendations may be updated based on patient status, additional functional criteria and insurance authorization.  ? ?Follow Up Recommendations ? No OT follow up  ?  ?Assistance Recommended at Discharge Intermittent Supervision/Assistance  ?Patient can return home with the following Assistance with cooking/housework;Direct supervision/assist for financial management;Assist for transportation;Help with stairs or ramp for entrance;Direct supervision/assist for medications management ? ?  ?Functional Status Assessment ?    ?Equipment Recommendations ? None recommended by OT  ?  ?Recommendations for Other Services   ? ? ?  ?Precautions / Restrictions Restrictions ?Weight Bearing Restrictions: No  ? ?  ? ?Mobility Bed Mobility ?Overal bed mobility: Modified Independent ?  ?  ?  ?  ?  ?   ?  ?  ? ?Transfers ?  ?  ?  ?  ?  ?  ?  ?  ?  ?  ?  ? ?  ?Balance Overall balance assessment: Mild deficits observed, not formally tested ?  ?  ?  ?  ?  ?  ?  ?  ?  ?  ?  ?  ?  ?  ?  ?  ?  ?  ?   ? ?ADL either performed or assessed with clinical judgement  ? ?ADL Overall ADL's : Needs assistance/impaired ?Eating/Feeding: Modified independent;Sitting ?Eating/Feeding Details (indicate cue type and reason): patient is able to take small sip from cup to finish brushing teeth. ?Grooming: Oral care;Set up;Standing ?  ?Upper Body Bathing: Supervision/ safety;Set up;Sitting ?  ?Lower Body Bathing: Supervison/ safety;Set up;Sit to/from stand;Sitting/lateral leans ?  ?Upper Body Dressing : Set up;Sitting ?  ?Lower Body Dressing: Minimal assistance;Sit to/from stand ?Lower Body Dressing Details (indicate cue type and reason): patient was able to manage clothing with min A after toileting tasks with patient forgetting to pull undergarments up prior to moving over to sink. ?Toilet Transfer: Min guard;Ambulation;Regular Toilet ?Toilet Transfer Details (indicate cue type and reason): with increased time and cues for safety. ?Toileting- Clothing Manipulation and Hygiene: Minimal assistance;Sit to/from stand ?  ?  ?  ?Functional mobility during ADLs: Min guard ?General ADL Comments: patients session was limited with patient production of emesis into toilet. patient transitioned back to bed with nursing made aware.  ? ? ? ?Vision Patient Visual Report: No change from baseline ?Additional Comments: noted to have yellow eyes  ?   ?Perception   ?  ?Praxis   ?  ? ?Pertinent  Vitals/Pain Pain Assessment ?Pain Assessment: 0-10 ?Pain Score: 3  ?Pain Location: BLE ?Pain Descriptors / Indicators: Aching ?Pain Intervention(s): Limited activity within patient's tolerance, Monitored during session  ? ? ? ?Hand Dominance Right ?  ?Extremity/Trunk Assessment Upper Extremity Assessment ?Upper Extremity Assessment: RUE deficits/detail;LUE  deficits/detail ?RUE Deficits / Details: reports history of rotator cuff tear. patient able to AROM to about 80 degrees FF and ABduction ?LUE Deficits / Details: able to AROM to about 80 degrees FF and ABduction ?  ?Lower Extremity Assessment ?Lower Extremity Assessment: Defer to PT evaluation ?  ?Cervical / Trunk Assessment ?Cervical / Trunk Assessment: Normal ?  ?Communication Communication ?Communication: No difficulties ?  ?Cognition Arousal/Alertness: Awake/alert ?Behavior During Therapy: [facility] for tasks assessed/performed ?Overall Cognitive Status: Within Functional Limits for tasks assessed ?  ?  ?  ?  ?  ?  ?  ?  ?  ?  ?  ?  ?  ?  ?  ?  ?General Comments: patient was able to follow commands and cooperative during session ?  ?  ?General Comments    ? ?  ?Exercises   ?  ?Shoulder Instructions    ? ? ?Home Living Family/patient expects to be discharged to:: Private residence ?Living Arrangements: Alone ?Available Help at Discharge: Family;Friend(s);Available PRN/intermittently ?Type of Home: Apartment ?Home Access: Elevator ?  ?  ?Home Layout: One level ?  ?  ?  ?  ?  ?  ?  ?  ?  ?  ?  ? ?  ?Prior Functioning/Environment Prior Level of Function : Independent/Modified Independent ?  ?  ?  ?  ?  ?  ?  ?  ?  ? ?  ?  ?OT Problem List: Decreased activity tolerance;Impaired balance (sitting and/or standing);Decreased safety awareness;Decreased knowledge of precautions;Decreased knowledge of use of DME or AE ?  ?   ?OT Treatment/Interventions: Self-care/ADL training;DME and/or AE instruction;Balance training;Patient/family education  ?  ?OT Goals(Current goals can be found in the care plan section) Acute Rehab OT Goals ?Patient Stated Goal: to get back in bed ?OT Goal Formulation: With patient ?Time For Goal Achievement: 02/06/22 ?Potential to Achieve Goals: Fair  ?OT Frequency: Min 1X/week ?  ? ?Co-evaluation   ?  ?  ?  ?  ? ?  ?AM-PAC OT "6 Clicks" Daily Activity     ?Outcome Measure Help from another person eating  meals?: A Little ?Help from another person taking care of personal grooming?: A Little ?Help from another person toileting, which includes using toliet, bedpan, or urinal?: A Little ?Help from another person bathing (including washing, rinsing, drying)?: A Little ?Help from another person to put on and taking off regular upper body clothing?: A Little ?Help from another person to put on and taking off regular lower body clothing?: A Little ?6 Click Score: 18 ?  ?End of Session Nurse Communication: Other (comment) (request for anti nausea meds) ? ?Activity Tolerance: Patient tolerated treatment well ?Patient left: in bed;with call bell/phone within reach;with bed alarm set ? ?OT Visit Diagnosis: Unsteadiness on feet (R26.81);Other abnormalities of gait and mobility (R26.89)  ?              ?Time: 9518-8416 ?OT Time Calculation (min): 16 min ?Charges:  OT General Charges ?$OT Visit: 1 Visit ?OT Evaluation ?$OT Eval Low Complexity: 1 Low ? ?Jaymie Mckiddy OTR/L, MS ?Acute Rehabilitation Department ?Office# [phone] ?Pager# [phone] ? ? ?[place] ?01/23/2022, 8:47 AM ?

## 2022-01-23 NOTE — Progress Notes (Signed)
Initial Nutrition Assessment ? ?DOCUMENTATION CODES:  ? ?Not applicable ? ?INTERVENTION:  ?- Encourage adequate PO intake ? ?- Ensure Enlive po BID, each supplement provides 350 kcal and 20 grams of protein. ? ?- MVI with minerals daily ? ?NUTRITION DIAGNOSIS:  ? ?Increased nutrient needs related to acute illness, chronic illness as evidenced by estimated needs. ? ?GOAL:  ? ?Patient will meet greater than or equal to 90% of their needs ? ?MONITOR:  ? ?PO intake, Supplement acceptance, Labs, I & O's, Weight trends ? ?REASON FOR ASSESSMENT:  ? ?Consult ?Assessment of nutrition requirement/status ? ?ASSESSMENT:  ? ?Pt admitted with abdominal distension and leg swelling secondary to liver failure without hepatic coma. PMH significant for thrombocytopenia d/t ITP and alcoholic cirrhosis/secondary splenomegaly, alcohol abuse, and untreated pancreatic mass. Prior paracentesis 3 weeks ago, yielding 3.5L. Recent admit in April to [facility] for severe hematemesis and esophageal varices banding. ? ?05/16: s/p paracentesis- yield 4L ? ?Pending palliative, GOC discussion. ? ?Pt provided limited nutrition and weight history. She states that she has not been eating well at home. She recalls eating a slice of pizza. She received breakfast but did not eat this morning. Pt endorses nausea which was treated with zofran. Pt endorses early satiety d/t recurrent ascites. Noted plans for paracentesis again today.  ? ?Pt states that within the past week she has increased difficulty with mobility, feeling as though she is going to fall over head first. She reports a dry weight of ~135 lbs but has had constant weight fluctuations d/t ascites.  ? ?Reviewed weight history. Within the last year, pt's weight has fluctuated between 61.3-66.2 kg. No significant weight loss noted within the last year. Her current admit weight is noted to be 68.5 kg. Suspect this weight is likely slightly elevated d/t presence of ascites.  ? ?Briefly discussed  with pt the importance of adequate nutritional intake for muscle maintenance and strength. Spoke with her about importance of limiting salt intake to prevent additional fluid retention. She is agreeable to receive Ensure during admission to enhance nutritional adequacy.  ? ?Suspect pt has a degree of malnutrition given mild muscle depletions however unable to diagnose at this time given limited nutrition history and inability to quantify recent PO intake. If pt remains inpatient, will reassess at follow up.  ? ?Medications: folvite, lasix, lactulose, MAG-OX, protonix, KLOR-CON, thiamine ?IV drips: potassium phosphate in D5 ? ?Labs: BUN <5, calcium 8.78, phosphorus 2.0, alkaline phosphatase 220, AST 80, ammonia 133 ? ?NUTRITION - FOCUSED PHYSICAL EXAM: ? ?Flowsheet Row Most Recent Value  ?Orbital Region No depletion  ?Upper Arm Region No depletion  ?Thoracic and Lumbar Region Mild depletion  ?Buccal Region No depletion  ?Temple Region Mild depletion  ?Clavicle Bone Region Mild depletion  ?Clavicle and Acromion Bone Region No depletion  ?Scapular Bone Region No depletion  ?Dorsal Hand No depletion  ?Patellar Region Mild depletion  ?Anterior Thigh Region No depletion  ?Posterior Calf Region No depletion  ?Edema (RD Assessment) Moderate  [pitting BLE]  ?Hair Reviewed  ?Eyes Other (Comment)  [yellow]  ?Mouth Reviewed  ?Skin Reviewed  ?Nails Other (Comment)  [painted]  ? ?  ? ?Diet Order:   ?Diet Order   ? ?       ?  Diet 2 gram sodium Room service appropriate? Yes; Fluid consistency: Thin; Fluid restriction: 1200 mL Fluid  Diet effective now       ?  ? ?  ?  ? ?  ? ? ?EDUCATION NEEDS:  ? ?  Education needs have been addressed ? ?Skin:  Skin Assessment: Reviewed RN Assessment ? ?Last BM:  5/17 ? ?Height:  ? ?Ht Readings from Last 1 Encounters:  ?01/22/22 '4\' 11"'$  (1.499 m)  ? ? ?Weight:  ? ?Wt Readings from Last 1 Encounters:  ?01/22/22 68.5 kg  ? ? ?Ideal Body Weight:  44.7 kg ? ?BMI:  Body mass index is 30.5  kg/m?. ? ?Estimated Nutritional Needs:  ? ?Kcal:  1500-1700 ? ?Protein:  70-85g ? ?Fluid:  >/=1.5L ? ?Clayborne Dana, RDN, LDN ?Clinical Nutrition ?

## 2022-01-23 NOTE — Consult Note (Signed)
Consultation Note Date: 01/23/2022   Patient Name: Briana Sweeney  DOB: 06-30-1971  MRN: [id number]  Age / Sex: 51 y.o., female  PCP: Marco Collie, MD Referring Physician: Aline August, MD  Reason for Consultation: Establishing goals of care  HPI/Patient Profile: 51 y.o. female  with past medical history of decompensated alcoholic liver cirrhosis with ascites, esophageal varices, variceal bleeding requiring banding in February of this year, hepatitis C, chronic thrombocytopenia, chronic anemia admitted on 01/22/2022 with decompensated cirrhosis.   Clinical Assessment and Goals of Care: I met today with Briana Sweeney.   I introduced palliative care as specialized medical care for people living with serious illness. It focuses on providing relief from the symptoms and stress of a serious illness. The goal is to improve quality of life for both the patient and the family.  We discussed clinical course as well as wishes moving forward in regard to advanced directives.  Values and goals of care important to patient and family were attempted to be elicited.  Questions and concerns addressed.   PMT will continue to support holistically.   SUMMARY OF RECOMMENDATIONS   -Full code/full scope -We discussed surrogate decision making.  She understands that at this point her 2 adult children be her surrogate decision makers.  She is going to consider if these are most appropriate people and will let us know if she wants complete healthcare power of attorney paperwork prior to discharging from the hospital. -She is hopeful for improvement and tells me that she is wanting to abstain from alcohol long enough to be a candidate for transplant if that is what would need to happen to add more time to her life.  Code Status/Advance Care Planning: Full code  Prognosis:  Guarded  Discharge Planning: To Be Determined       Primary Diagnoses: Present on Admission:  Liver failure without hepatic coma ([place])  Alcoholic cirrhosis of liver with ascites and HCV infection  Severe neutropenia (HCC)  Pancytopenia (HCC)  Hypokalemia  Alcohol use disorder, severe, dependence (HCC)  Hypomagnesemia  Esophageal varices without bleeding (HCC)  Thrombocytopenia (HCC)  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HCC)  Mild protein malnutrition (HCC)  Hypophosphatemia  Cirrhosis of liver ([place])   I have reviewed the medical record, interviewed the patient and family, and examined the patient. The following aspects are pertinent.  Past Medical History:  Diagnosis Date   Abscess of bursa of left elbow 09/23/2018   Acute hyperactive alcohol withdrawal delirium ([place]) 10/09/2017   Acute low back pain with bilateral sciatica    Acute metabolic encephalopathy 67/08/4579   Acute on chronic pancreatitis ([place]) 04/04/2021   AKI (acute kidney injury) ([place]) 04/04/2021   Alcohol withdrawal syndrome with complication ([place]) 99/83/3825   Alcohol-induced acute pancreatitis    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[place]) 12/05/2018   Ascites due to alcoholic cirrhosis ([place])    Bipolar affective disorder ([place])    With anxiety features   Bunionette of right foot 11/2019   Cirrhosis of liver (HCC)    Due to alcohol  and hepatitis C   Cocaine dependence without complication ([place]) 14/97/0263   Decompensated hepatic cirrhosis ([place]) 07/23/2017   Depression with anxiety    Epistaxis 01/20/2019   Erysipelas 09/13/2018   Esophageal 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 ([place])    Esophageal varices without bleeding (HCC)    ETOHism (HCC)    GERD (gastroesophageal reflux disease)    Head injury    Hematemesis 02/10/2018   Hepatitis C 2018   hepatitis c and alcohol related hepatitis   Heroin abuse ([place])    History of blood transfusion    "blood doesn't clot; I fell down and had to have a transfusion"   History of kidney stones    Menopause 2016   Migraine     "when I get really stressed" (09/01/2017)   Neuropathy 10/27/2018   Olecranon bursitis of left elbow    Other mixed anxiety disorders 10/27/2018   Overdose 08/22/2018   Pancytopenia ([place]) 07/23/2017   Polysubstance abuse ([place]) 04/08/2018   Portal hypertension ([place]) 03/18/2018   S/P foot surgery, right 11/2019   Schizophrenia ([place])    Seizures ([place])    "when I run out of my RX; lots recently" (09/01/2017)   Suicidal ideation    Thrombocytopenia ([place])    Trichimoniasis 10/30/2017   Upper GI bleed 02/10/2018   Urinary urgency 05/2020   UTI (urinary tract infection) 06/02/2017   Social History   Socioeconomic History   Marital status: Divorced    Spouse name: Not on file   Number of children: 2   Years of education: Not on file   Highest education level: Not on file  Occupational History   Occupation: applying for disability  Tobacco Use   Smoking status: Never   Smokeless tobacco: Never  Vaping Use   Vaping Use: Never used  Substance and Sexual Activity   Alcohol use: Not Currently    Alcohol/week: 6.0 standard drinks    Types: 6 Cans of beer per week    Comment: weekly   Drug use: Not Currently   Sexual activity: Not on file  Other Topics Concern   Not on file  Social History Narrative   She moved with a boyfriend to [place] and was followed at [facility].  He died of a massive heart attack in 8/18, per her report, and so she moved back to [place] and is living with a friend.      Right handed   2 story home    Lives alone 11/09/19   Social Determinants of Health   Financial Resource Strain: Not on file  Food Insecurity: Not on file  Transportation Needs: Not on file  Physical Activity: Not on file  Stress: Not on file  Social Connections: Not on file   Family History  Problem Relation Age of Onset   Lung cancer Mother 75   Alcohol abuse Mother    Throat cancer Father 32   Scheduled Meds:  ARIPiprazole  10 mg Oral q morning   feeding  supplement  237 mL Oral BID BM   FLUoxetine  40 mg Oral q morning   folic acid  1 mg Oral Daily   furosemide  60 mg Oral Daily   lactulose  20 g Oral TID   levETIRAcetam  750 mg Oral BID   magnesium oxide  400 mg Oral BID   multivitamin with minerals  1 tablet Oral Daily   nadolol  20 mg Oral q morning   pantoprazole  40 mg Oral q morning  potassium chloride  20 mEq Oral Daily   romiPLOStim  4 mcg/kg Subcutaneous Weekly   sodium chloride flush  3 mL Intravenous Q12H   sodium chloride flush  3 mL Intravenous Q12H   spironolactone  150 mg Oral Daily   thiamine  100 mg Oral Daily   Or   thiamine  100 mg Intravenous Daily   Continuous Infusions:  sodium chloride     sodium chloride     albumin human     PRN Meds:.sodium chloride, sodium chloride, clonazePAM, LORazepam **OR** LORazepam, ondansetron (ZOFRAN) IV, sodium chloride flush, sodium chloride flush Medications Prior to Admission:  Prior to Admission medications   Medication Sig Start Date End Date Taking? Authorizing Provider  ARIPiprazole (ABILIFY) 10 MG tablet Take 1 tablet by mouth every day Patient taking differently: Take 10 mg by mouth every morning. 11/22/21  Yes   clonazePAM (KLONOPIN) 0.5 MG tablet Take 1 tablet by mouth 2 times daily. 08/30/21  Yes   diclofenac Sodium (VOLTAREN) 1 % GEL Apply 1 application. topically at bedtime as needed (arthritis pain).   Yes [provider]  FLUoxetine (PROZAC) 40 MG capsule Take 1 capsule (40 mg total) by mouth daily. Patient taking differently: Take 40 mg by mouth every morning. 11/22/21  Yes   furosemide (LASIX) 40 MG tablet Take 1.5 tablets (60 mg total) by mouth daily. 12/16/21 01/22/22 Yes Elodia Florence., MD  gabapentin (NEURONTIN) 300 MG capsule Take 300 mg by mouth 3 (three) times daily.   Yes [provider]  lactulose (CHRONULAC) 10 GM/15ML solution Take 30 mLs by mouth 2 (two) times daily as needed for Constipation (take as needed to have goal 2-3  bowel movements daily) for up to 30 days Patient taking differently: Take 20 g by mouth 3 (three) times daily. 10/29/21  Yes   levETIRAcetam (KEPPRA) 750 MG tablet Take 1 tablet (750 mg total) by mouth 2 (two) times daily. 07/06/21  Yes Cameron Sprang, MD  Multiple Vitamin (MULTIVITAMIN WITH MINERALS) TABS tablet Take 1 tablet by mouth daily.   Yes [provider]  nadolol (CORGARD) 20 MG tablet Take 1 tablet by mouth daily Patient taking differently: Take 20 mg by mouth every morning. 12/10/21  Yes   pantoprazole (PROTONIX) 40 MG tablet Take 1 tablet (40 mg total) by mouth 2 (two) times daily before meals Patient taking differently: Take 40 mg by mouth every morning. 02/26/34  Yes   folic acid (FOLVITE) 1 MG tablet Take 1 tablet (1 mg total) by mouth once daily Patient not taking: Reported on 12/15/2021 10/29/21     potassium chloride (KLOR-CON) 20 MEQ packet Take 20 mEq by mouth daily for 14 days. Follow repeat labs within 1-2 weeks. Patient not taking: Reported on 01/22/2022 12/16/21 12/30/21  Elodia Florence., MD  spironolactone (ALDACTONE) 100 MG tablet Take 1.5 tablets (150 mg total) by mouth daily. Patient not taking: Reported on 01/22/2022 12/16/21 01/15/22  Elodia Florence., MD   Allergies  Allergen Reactions   Other Other (See Comments)    Platelets: Rx chest pain, tremors, body aches   Review of Systems Leg pain and swelling  Physical Exam General: Alert, awake, in no acute distress.   HEENT: No bruits, no goiter, no JVD Heart: Regular rate and rhythm. No murmur appreciated. Lungs: Scattered crackles  abdomen: Distended Ext: + edema Skin: Warm and dry Neuro: Grossly intact, nonfocal.   Vital Signs: BP 113/83 (BP Location: Right Arm)   Pulse  91   Temp 98.3 F (36.8 C) (Oral)   Resp 20   Ht $R'4\' 11"'Fu$  (1.499 m)   Wt 68.5 kg   SpO2 95%   BMI 30.50 kg/m  Pain Scale: 0-10   Pain Score: 0-No pain   SpO2: SpO2: 95 % O2 Device:SpO2: 95 % O2 Flow Rate: .   IO:  Intake/output summary:  Intake/Output Summary (Last 24 hours) at 01/23/2022 1705 Last data filed at 01/23/2022 1400 Gross per 24 hour  Intake 698.15 ml  Output 1 ml  Net 697.15 ml    LBM: Last BM Date : 01/23/22 Baseline Weight: Weight: 68.5 kg Most recent weight: Weight: 68.5 kg     Palliative Assessment/Data:    Time Total: 60 minutes Greater than 50%  of this time was spent counseling and coordinating care related to the above assessment and plan.  Signed by: Micheline Rough, MD   Please contact Palliative Medicine Team phone at [phone] for questions and concerns.  For individual provider: See Shea Evans

## 2022-01-23 NOTE — TOC Initial Note (Signed)
Transition of Care (TOC) - Initial/Assessment Note  ? ? ?Patient Details  ?Name: Briana Sweeney ?MRN: [id number] ?Date of Birth: August 22, 1971 ? ?Transition of Care [facility]) CM/SW Contact:    ?Leeroy Cha, RN ?Phone Number: ?01/23/2022, 8:51 AM ? ?Clinical Narrative:                 ?Pt with hx of etoh abuse will discuss and give substance abuse resources to. ? ?Expected Discharge Plan: Home/Self Care ?Barriers to Discharge: No Barriers Identified ? ? ?Patient Goals and CMS Choice ?Patient states their goals for this hospitalization and ongoing recovery are:: to go homne ?CMS Medicare.gov Compare Post Acute Care list provided to:: Patient ?  ? ?Expected Discharge Plan and Services ?Expected Discharge Plan: Home/Self Care ?  ?Discharge Planning Services: CM Consult ?  ?Living arrangements for the past 2 months: Apartment ?                ?  ?  ?  ?  ?  ?  ?  ?  ?  ?  ? ?Prior Living Arrangements/Services ?Living arrangements for the past 2 months: Apartment ?Lives with:: Self ?Patient language and need for interpreter reviewed:: Yes ?Do you feel safe going back to the place where you live?: Yes      ?  ?  ?  ?Criminal Activity/Legal Involvement Pertinent to Current Situation/Hospitalization: No - Comment as needed ? ?Activities of Daily Living ?Home Assistive Devices/Equipment: None ?ADL Screening (condition at time of admission) ?Patient's cognitive ability adequate to safely complete daily activities?: Yes ?Is the patient deaf or have difficulty hearing?: No ?Does the patient have difficulty seeing, even when wearing glasses/contacts?: No ?Does the patient have difficulty concentrating, remembering, or making decisions?: No ?Patient able to express need for assistance with ADLs?: No ?Does the patient have difficulty dressing or bathing?: No ?Independently performs ADLs?: Yes (appropriate for developmental age) ?Does the patient have difficulty walking or climbing stairs?: No ?Weakness of Legs: Both ?Weakness of  Arms/Hands: None ? ?Permission Sought/Granted ?  ?  ?   ?   ?   ?   ? ?Emotional Assessment ?Appearance:: Appears stated age ?  ?  ?Orientation: : Oriented to Self, Oriented to Place, Oriented to Situation, Oriented to  Time ?Alcohol / Substance Use: Not Applicable ?Psych Involvement: No (comment) ? ?Admission diagnosis:  Hypokalemia [E87.6] ?Ascites due to alcoholic cirrhosis ([place]) [V56.43] ?Liver failure without hepatic coma ([place]) [K72.90] ?Patient Active Problem List  ? Diagnosis Date Noted  ? Liver failure without hepatic coma ([place]) 01/22/2022  ? Hypophosphatemia 01/22/2022  ? Severe neutropenia ([place]) 12/16/2021  ? Cellulitis 12/16/2021  ? Mild protein malnutrition ([place]) 12/15/2021  ? Anasarca 11/01/2021  ? Acute posthemorrhagic anemia   ? Esophageal 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 (HCC)   ? Abdominal pain 10/31/2021  ? Palpitations 10/11/2021  ? Bradycardia 07/23/2021  ? Secondary esophageal varices with bleeding (HCC)   ? Alcohol withdrawal ([place]) 06/26/2021  ? 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HCC) 12/18/2020  ? Chronic anemia 10/02/2020  ? Hyponatremia 04/02/2019  ? Other mixed anxiety disorders 10/27/2018  ? Neuropathy 10/27/2018  ? Substance induced mood disorder ([place]) 07/17/2018  ? Encephalopathy, portal systemic 04/08/2018  ? Seizure ([place]) 03/18/2018  ? Hepatitis C 03/18/2018  ? Portal hypertension ([place]) 03/18/2018  ? Depression 03/18/2018  ? Thrombocytopenia ([place]) 01/02/2018  ? GERD (gastroesophageal reflux disease) 11/01/2017  ? Alcohol use disorder, severe, dependence ([place]) 10/29/2017  ? Alcohol abuse with alcohol-induced mood disorder ([place])   ?  Esophageal varices without bleeding ([place])   ? Hematemesis 09/01/2017  ? Abdominal ascites   ? Hypokalemia 07/23/2017  ? Coagulopathy ([place]) 07/23/2017  ? Hypomagnesemia 07/23/2017  ? Pancytopenia ([place]) 07/23/2017  ? Alcoholic cirrhosis of liver with ascites and HCV infection 07/23/2017  ? ?PCP:  Marco Collie, MD ?Pharmacy:   ?[facility] ?[address] ?[place] [address] ?Phone: [phone] Fax: [phone] ? ?[facility] [address] [place] ?[place] ?[place] [address] ?Phone: [phone] Fax: [phone] ? ? ? ? ?Social Determinants of Health (SDOH) Interventions ?  ? ?Readmission Risk Interventions ? ?  07/06/2019  ?  3:09 PM  ?Readmission Risk Prevention Plan  ?Transportation Screening Complete  ?Medication Review Press photographer) Complete  ?PCP or Specialist appointment within 3-5 days of discharge Complete  ?[place] or Home Care Consult Complete  ?SW Recovery Care/Counseling Consult Complete  ?Palliative Care Screening Not Applicable  ?[place] Not Applicable  ? ? ? ?

## 2022-01-23 NOTE — Progress Notes (Signed)
Palliative care brief note ? ?Palliative care consult received.  ? ?Chart reviewed and met with Patient.  ? ?Discussed goals of care and surrogate decision making.  ? ?At this point, she is invested in plan for continuation of full code/full scope interventions. ? ?Recommend outpatient palliative care follow up. ? ?Full note to follow.  ? ?Micheline Rough, MD

## 2022-01-23 NOTE — Progress Notes (Signed)
?PROGRESS NOTE ? ? ? ?Briana Sweeney  QIW:[id number] DOB: 18-Dec-1970 DOA: 01/22/2022 ?PCP: Marco Collie, MD  ? ?Brief Narrative:  ?51 year old female with history of decompensated alcoholic liver cirrhosis with ascites requiring recurrent paracentesis with esophageal varices, variceal bleeding requiring admission from 10/22/2021-10/29/2021 at [facility] with hemorrhagic shock requiring multiple packed red cells/platelets/FFP/cryoprecipitate transfusion along with EGD with banding, hepatitis C with sustained virologic response, severe alcohol dependence, chronic thrombocytopenia, chronic anemia presented with abdominal distention and leg swelling.  She had ultrasound-guided paracentesis on 01/22/2022 by IR. ? ?Assessment & Plan: ?  ?Decompensated alcoholic cirrhosis with ascites requiring recurrent paracentesis with history of esophageal varices and banding ?Chronic hepatitis C ?Chronic pancytopenia ?Hepatic encephalopathy ?-Presented with increasing abdominal and leg swelling.  Had ultrasound-guided paracentesis and removal of 4 L fluid on 01/22/2022 by IR.  Complains of increasing abdominal distention today.  We will request another ultrasound-guided paracentesis today. ?-Continue Lasix and spironolactone.  Continue lactulose.  Slightly confused today.  Might need to add rifaximin as an outpatient.  Outpatient follow-up with GI/Dr. Melina Copa in [place] ?-Try and avoid sedatives as much as possible.  Monitor mental status ?-Monitor CBC.  Platelets are chronically low.  No signs of bleeding currently. ?-Fall precautions. ?-Overall prognosis is guarded to poor.  Palliative care consultation for goals of care discussion.  Patient is not a candidate for liver transplant given ongoing alcohol abuse. ?-Continue beta-blocker ? ?Alcohol abuse ?-Patient continues to drink alcohol.  Counseled regarding abstinence.  Social worker consult. ?-Continue CIWA protocol along with thiamine, folic acid and  multivitamins. ? ?Hypokalemia ?-Improved.  Monitor ? ?Hypomagnesemia ?-Improved.  Monitor ? ?Hyponatremia ?-improved.  Monitor ? ?History of seizures ?-Continue Keppra ? ?Hypophosphatemia ?-Improving ? ?Physical deconditioning ?-PT eval ? ? ?DVT prophylaxis: SCDs ?Code Status: Full ?Family Communication: None at bedside ?Disposition Plan: ?Status is: Observation ?The patient will require care spanning > 2 midnights and should be moved to inpatient because: Of severity of illness.   ? ? ?Consultants: Palliative care ? ?Procedures: Ultrasound-guided paracentesis and removal of 4 L fluid on 01/22/2022 ? ?Antimicrobials: None ? ? ?Subjective: ?Patient seen and examined at bedside.  Poor historian.  Awake but slightly confused to time.  Complained of nausea earlier this morning.  No fever, seizures or vomiting reported.  Still complains of abdominal distention. ? ?Objective: ?Vitals:  ? 01/22/22 2248 01/23/22 0346 01/23/22 0859 01/23/22 0903  ?BP: 106/76 119/86 116/84   ?Pulse: 99 (!) 109 (!) 108   ?Resp: '16 16 20   '$ ?Temp: 98.2 ?F (36.8 ?C) 97.8 ?F (36.6 ?C)  98.7 ?F (37.1 ?C)  ?TempSrc: Oral   Oral  ?SpO2: 92% 90% 94%   ?Weight:      ?Height:      ? ? ?Intake/Output Summary (Last 24 hours) at 01/23/2022 1028 ?Last data filed at 01/23/2022 0600 ?Gross per 24 hour  ?Intake 141.37 ml  ?Output 1 ml  ?Net 140.37 ml  ? ?Filed Weights  ? 01/22/22 1538  ?Weight: 68.5 kg  ? ? ?Examination: ? ?General exam: Appears calm and comfortable.  Looks chronically ill and deconditioned.  Currently on room air. ?Respiratory system: Bilateral decreased breath sounds at bases with scattered crackles ?Cardiovascular system: S1 & S2 heard, tachycardic ?Gastrointestinal system: Abdomen is still distended, soft and mildly tender diffusely.  No rebound tenderness.  Normal bowel sounds heard. ?Extremities: No cyanosis, clubbing; bilateral lower extremity edema present ?Central nervous system: Awake, slow to respond, poor historian.  Confused to time.   No focal  neurological deficits. Moving extremities ?Skin: No rashes, lesions or ulcers ?Psychiatry: Could not be assessed because of mental status.  Flat affect. ? ? ? ?Data Reviewed: I have personally reviewed following labs and imaging studies ? ?CBC: ?Recent Labs  ?Lab 01/21/22 ?0000 01/22/22 ?1726 01/23/22 ?0508  ?WBC 2.8 2.9* 2.3*  ?NEUTROABS 1.15 1.1*  --   ?HGB 8.1* 8.5* 7.8*  ?HCT 26* 26.2* 24.3*  ?MCV  --  76.6* 75.7*  ?PLT 27* 31* 21*  ? ?Basic Metabolic Panel: ?Recent Labs  ?Lab 01/22/22 ?1726 01/22/22 ?2021 01/23/22 ?0000 01/23/22 ?4696  ?NA 134*  --  135 135  ?K 2.5*  --  3.3* 3.5  ?CL 97*  --  100 100  ?CO2 29  --  24 24  ?GLUCOSE 113*  --  131* 92  ?BUN <5*  --  <5* <5*  ?CREATININE 0.57  --  0.51 0.53  ?CALCIUM 7.8*  --  7.5* 7.5*  ?MG 1.4*  --  1.9 1.8  ?PHOS  --  2.4* 1.8* 2.0*  ? ?GFR: ?Estimated Creatinine Clearance: 71.6 mL/min (by C-G formula based on SCr of 0.53 mg/dL). ?Liver Function Tests: ?Recent Labs  ?Lab 01/22/22 ?1726 01/23/22 ?0508  ?AST 82* 80*  ?ALT 22 19  ?ALKPHOS 227* 220*  ?BILITOT 7.1* 7.2*  ?PROT 6.8 6.5  ?ALBUMIN 2.4* 2.4*  ? ?Recent Labs  ?Lab 01/22/22 ?1726  ?LIPASE 40  ? ?Recent Labs  ?Lab 01/22/22 ?1727 01/23/22 ?0508  ?AMMONIA 82* 133*  ? ?Coagulation Profile: ?Recent Labs  ?Lab 01/22/22 ?1726 01/23/22 ?0508  ?INR 1.9* 2.2*  ? ?Cardiac Enzymes: ?Recent Labs  ?Lab 01/22/22 ?2021  ?CKTOTAL 54  ? ?BNP (last 3 results) ?No results for input(s): PROBNP in the last 8760 hours. ?HbA1C: ?No results for input(s): HGBA1C in the last 72 hours. ?CBG: ?No results for input(s): GLUCAP in the last 168 hours. ?Lipid Profile: ?No results for input(s): CHOL, HDL, LDLCALC, TRIG, CHOLHDL, LDLDIRECT in the last 72 hours. ?Thyroid Function Tests: ?Recent Labs  ?  01/22/22 ?2025  ?TSH 2.450  ? ?Anemia Panel: ?Recent Labs  ?  01/22/22 ?2021 01/22/22 ?2025  ?[id number] 1,073*  --   ?FOLATE 14.2  --   ?FERRITIN 23  --   ?TIBC 308  --   ?IRON 41  --   ?RETICCTPCT  --  2.1  ? ?Sepsis Labs: ?No  results for input(s): PROCALCITON, LATICACIDVEN in the last 168 hours. ? ?No results found for this or any previous visit (from the past 240 hour(s)).  ? ? ? ? ? ?Radiology Studies: ?DG Chest Portable 1 View ? ?Result Date: 01/22/2022 ?CLINICAL DATA:  Short of breath, history of pancreatic cancer and cirrhosis EXAM: PORTABLE CHEST 1 VIEW COMPARISON:  01/12/2022 FINDINGS: Single frontal view of the chest demonstrates a stable cardiac silhouette. Stable calcified left hilar lymph nodes. No airspace disease, effusion, or pneumothorax. No acute bony abnormalities. IMPRESSION: 1. No acute intrathoracic process. Electronically Signed   By: Randa Ngo M.D.   On: 01/22/2022 17:59   ? ? ? ? ? ?Scheduled Meds: ? ARIPiprazole  10 mg Oral q morning  ? FLUoxetine  40 mg Oral q morning  ? folic acid  1 mg Oral Daily  ? furosemide  60 mg Oral Daily  ? lactulose  20 g Oral TID  ? levETIRAcetam  750 mg Oral BID  ? magnesium oxide  400 mg Oral BID  ? multivitamin with minerals  1 tablet Oral Daily  ? nadolol  20 mg Oral q morning  ? pantoprazole  40 mg Oral q morning  ? potassium chloride  20 mEq Oral Daily  ? sodium chloride flush  3 mL Intravenous Q12H  ? sodium chloride flush  3 mL Intravenous Q12H  ? spironolactone  150 mg Oral Daily  ? thiamine  100 mg Oral Daily  ? Or  ? thiamine  100 mg Intravenous Daily  ? ?Continuous Infusions: ? sodium chloride    ? sodium chloride    ? albumin human    ? potassium PHOSPHATE IVPB (in mmol) 15 mmol (01/23/22 0821)  ? ? ? ? ? ? ? ? ?Aline August, MD ?[facility] ?01/23/2022, 10:28 AM  ? ?

## 2022-01-24 ENCOUNTER — Encounter: Payer: Self-pay | Admitting: Oncology

## 2022-01-24 ENCOUNTER — Other Ambulatory Visit (HOSPITAL_COMMUNITY): Payer: Self-pay

## 2022-01-24 DIAGNOSIS — D61818 Other pancytopenia: Secondary | ICD-10-CM | POA: Diagnosis not present

## 2022-01-24 DIAGNOSIS — D696 Thrombocytopenia, unspecified: Secondary | ICD-10-CM

## 2022-01-24 DIAGNOSIS — R569 Unspecified convulsions: Secondary | ICD-10-CM | POA: Diagnosis not present

## 2022-01-24 DIAGNOSIS — K7031 Alcoholic cirrhosis of liver with ascites: Secondary | ICD-10-CM | POA: Diagnosis not present

## 2022-01-24 DIAGNOSIS — I8500 Esophageal varices without bleeding: Secondary | ICD-10-CM | POA: Diagnosis not present

## 2022-01-24 LAB — COMPREHENSIVE METABOLIC PANEL
ALT: 19 U/L (ref 0–44)
AST: 66 U/L — ABNORMAL HIGH (ref 15–41)
Albumin: 2.3 g/dL — ABNORMAL LOW (ref 3.5–5.0)
Alkaline Phosphatase: 208 U/L — ABNORMAL HIGH (ref 38–126)
Anion gap: 8 (ref 5–15)
BUN: 7 mg/dL (ref 6–20)
CO2: 24 mmol/L (ref 22–32)
Calcium: 8.1 mg/dL — ABNORMAL LOW (ref 8.9–10.3)
Chloride: 103 mmol/L (ref 98–111)
Creatinine, Ser: 0.57 mg/dL (ref 0.44–1.00)
GFR, Estimated: >60 mL/min (ref >60)
Glucose, Bld: 92 mg/dL (ref 70–99)
Potassium: 3.5 mmol/L (ref 3.5–5.1)
Sodium: 135 mmol/L (ref 135–145)
Total Bilirubin: 10.2 mg/dL — ABNORMAL HIGH (ref 0.3–1.2)
Total Protein: 6.3 g/dL — ABNORMAL LOW (ref 6.5–8.1)

## 2022-01-24 LAB — GRAM STAIN

## 2022-01-24 LAB — MAGNESIUM: Magnesium: 1.8 mg/dL (ref 1.7–2.4)

## 2022-01-24 MED ORDER — LACTULOSE 10 GM/15ML PO SOLN: 20.0000 g | Freq: Three times a day (TID) | ORAL | Status: DC

## 2022-01-24 MED ORDER — SPIRONOLACTONE 100 MG PO TABS
150.0000 mg | ORAL_TABLET | Freq: Every day | ORAL | 0 refills | Status: DC
  Filled 2022-01-24: qty 45, 30d supply, fill #0

## 2022-01-24 MED ORDER — PANTOPRAZOLE SODIUM 40 MG PO TBEC: 40.0000 mg | DELAYED_RELEASE_TABLET | Freq: Every morning | ORAL | Status: DC

## 2022-01-24 MED ORDER — FUROSEMIDE 40 MG PO TABS
60.0000 mg | ORAL_TABLET | Freq: Every day | ORAL | 0 refills | Status: DC
  Filled 2022-01-24: qty 45, 30d supply, fill #0

## 2022-01-24 MED ORDER — FOLIC ACID 1 MG PO TABS
ORAL_TABLET | ORAL | 0 refills | Status: DC
  Filled 2022-01-24: qty 30, 30d supply, fill #0

## 2022-01-24 MED ORDER — POTASSIUM CHLORIDE 20 MEQ PO PACK
20.0000 meq | PACK | Freq: Every day | ORAL | 0 refills | Status: DC
  Filled 2022-01-24: qty 14, 14d supply, fill #0

## 2022-01-24 MED ORDER — THIAMINE HCL 100 MG PO TABS
100.0000 mg | ORAL_TABLET | Freq: Every day | ORAL | 0 refills | Status: DC
  Filled 2022-01-24: qty 30, 30d supply, fill #0

## 2022-01-24 NOTE — TOC Transition Note (Signed)
Transition of Care [facility]) - CM/SW Discharge Note   Patient Details  Name: Briana Sweeney MRN: [id number] Date of Birth: [birth year]  [facility] CM/SW Contact:  Leeroy Cha, RN Phone Number: 01/24/2022, 10:47 AM   Clinical Narrative:    Dcd to home with no toc needs   Final next level of care: Home/Self Care Barriers to Discharge: No Barriers Identified   Patient Goals and CMS Choice Patient states their goals for this hospitalization and ongoing recovery are:: to go homne CMS Medicare.gov Compare Post Acute Care list provided to:: Patient    Discharge Placement                       Discharge Plan and Services   Discharge Planning Services: CM Consult                                 Social Determinants of Health (SDOH) Interventions     Readmission Risk Interventions    07/06/2019    3:09 PM  Readmission Risk Prevention Plan  Transportation Screening Complete  Medication Review ([place]) Complete  PCP or Specialist appointment within 3-5 days of discharge Complete  [place] or [place] Complete  SW Recovery Care/Counseling Consult Complete  [place] Not Applicable

## 2022-01-24 NOTE — Plan of Care (Signed)

## 2022-01-24 NOTE — Discharge Summary (Signed)
Physician Discharge Summary  Briana Sweeney QQV:[id number] DOB: [birth year] DOA: 01/22/2022  PCP: Marco Collie, MD  Admit date: 01/22/2022 Discharge date: 01/24/2022  Admitted From: Home Disposition: Home  Recommendations for Outpatient Follow-up:  Follow up with PCP in 1 week with repeat CBC/CMP Outpatient follow-up with GI/Dr. Melina Copa and oncology in [place] follow-up with palliative care Comply with medications and follow-up.  Abstain from alcohol. Follow up in ED if symptoms worsen or new appear   Home Health: No Equipment/Devices: None  Discharge Condition: Guarded to poor  CODE STATUS: Full Diet recommendation: Heart healthy/fluid restriction of up to 1200 cc a day  Brief/Interim Summary: 51 year old female with history of decompensated alcoholic liver cirrhosis with ascites requiring recurrent paracentesis with esophageal varices, variceal bleeding requiring admission from 10/22/2021-10/29/2021 at [facility] with hemorrhagic shock requiring multiple packed red cells/platelets/FFP/cryoprecipitate transfusion along with EGD with banding, hepatitis C with sustained virologic response, severe alcohol dependence, chronic thrombocytopenia, chronic anemia presented with abdominal distention and leg swelling.  She had ultrasound-guided paracentesis on 01/22/2022 by IR.  Subsequently repeat ultrasound did not have enough fluid for repeat paracentesis.  Patient received a dose of Romiplostim by oncology.  Palliative care recommended outpatient palliative care follow-up.  She will be discharged home today with outpatient follow-up with PCP/GI/oncology and possibly palliative care.  Overall prognosis is guarded to poor.  Discharge Diagnoses:   Decompensated alcoholic cirrhosis with ascites requiring recurrent paracentesis with history of esophageal varices and banding Chronic hepatitis C Chronic pancytopenia Hepatic encephalopathy Elevated LFTs -Presented with  increasing abdominal and leg swelling.  Had ultrasound-guided paracentesis and removal of 4 L fluid on 01/22/2022 by IR.   Subsequently repeat ultrasound did not have enough fluid for repeat paracentesis. -Continue Lasix and spironolactone.  Continue lactulose.  Might need to add rifaximin as an outpatient.  Outpatient follow-up with GI/Dr. Melina Copa in [place] -Platelets are chronically low.  No signs of bleeding currently.  Patient received a dose of Romiplostim by oncology on 01/23/2022.  Outpatient follow-up with oncology. -Overall prognosis is guarded to poor.  Palliative care consulted for goals of care discussion.  Recommend outpatient follow-up with palliative care.  Patient is not a candidate for liver transplant given ongoing alcohol abuse. -Continue beta-blocker -Discharge home today. -LFTs are elevated.  Outpatient follow-up.   Alcohol abuse -Patient continues to drink alcohol.  Counseled regarding abstinence.  Social worker consult. -Currently on CIWA protocol along with thiamine, folic acid and multivitamins.  Continue thiamine, folic acid and multivitamins on discharge.   Hypokalemia -Improved.  Continue supplementation on discharge. Hypomagnesemia -Improved.    Hyponatremia -improved.     History of seizures -Continue Keppra   Hypophosphatemia -Improving   Physical deconditioning -Tolerated PT well.  Discharge Instructions  Discharge Instructions     Amb Referral to Palliative Care   Complete by: As directed    Diet - low sodium heart healthy   Complete by: As directed    Increase activity slowly   Complete by: As directed       Allergies as of 01/24/2022       Reactions   Other Other (See Comments)   Platelets: Rx chest pain, tremors, body aches        Medication List     STOP taking these medications    gabapentin 300 MG capsule Commonly known as: NEURONTIN       TAKE these medications    ARIPiprazole 10 MG tablet Commonly known as:  ABILIFY Take 1 tablet by mouth  every day What changed:  how much to take how to take this when to take this   clonazePAM 0.5 MG tablet Commonly known as: KLONOPIN Take 1 tablet by mouth 2 times daily.   diclofenac Sodium 1 % Gel Commonly known as: VOLTAREN Apply 1 application. topically at bedtime as needed (arthritis pain).   FLUoxetine 40 MG capsule Commonly known as: PROZAC Take 1 capsule (40 mg total) by mouth daily. What changed: when to take this   folic acid 1 MG tablet Commonly known as: FOLVITE Take 1 tablet (1 mg total) by mouth once daily   furosemide 40 MG tablet Commonly known as: LASIX Take 1.5 tablets (60 mg total) by mouth daily.   lactulose 10 GM/15ML solution Commonly known as: CHRONULAC Take 30 mLs (20 g total) by mouth 3 (three) times daily.   levETIRAcetam 750 MG tablet Commonly known as: KEPPRA Take 1 tablet (750 mg total) by mouth 2 (two) times daily.   multivitamin with minerals Tabs tablet Take 1 tablet by mouth daily.   nadolol 20 MG tablet Commonly known as: Corgard Take 1 tablet by mouth daily What changed:  how much to take how to take this when to take this   pantoprazole 40 MG tablet Commonly known as: PROTONIX Take 1 tablet (40 mg total) by mouth every morning.   potassium chloride 20 MEQ packet Commonly known as: Klor-Con Take 20 mEq by mouth daily for 14 days. What changed: additional instructions   spironolactone 100 MG tablet Commonly known as: ALDACTONE Take 1.5 tablets (150 mg total) by mouth daily.   thiamine 100 MG tablet Take 1 tablet (100 mg total) by mouth daily. Start taking on: Jan 25, 2022        Allergies  Allergen Reactions   Other Other (See Comments)    Platelets: Rx chest pain, tremors, body aches    Consultations: Oncology/palliative care   Procedures/Studies: US Paracentesis  Result Date: 01/23/2022 INDICATION: History of cirrhosis with recurrent ascites. Request for therapeutic and  diagnostic paracentesis. EXAM: ULTRASOUND GUIDED DIAGNOSTIC AND THERAPEUTIC RIGHT LOWER QUADRANT PARACENTESIS MEDICATIONS: 10 mL 1 % lidocaine COMPLICATIONS: None immediate. PROCEDURE: Informed written consent was obtained from the patient after a discussion of the risks, benefits and alternatives to treatment. A timeout was performed prior to the initiation of the procedure. Initial ultrasound scanning demonstrates a large amount of ascites within the right lower abdominal quadrant. The right lower abdomen was prepped and draped in the usual sterile fashion. 1% lidocaine was used for local anesthesia. Following this, a 19 gauge, 10-cm, Yueh catheter was introduced. An ultrasound image was saved for documentation purposes. The paracentesis was performed. The catheter was removed and a dressing was applied. The patient tolerated the procedure well without immediate post procedural complication. FINDINGS: A total of approximately 4 L of cloudy, yellow fluid was removed. Samples were sent to the laboratory as requested by the clinical team. IMPRESSION: Successful ultrasound-guided paracentesis yielding 4 liters of peritoneal fluid. Read by: Narda Rutherford, AGNP-BC Electronically Signed   By: Ruthann Cancer M.D.   On: 01/23/2022 07:55   DG Chest Portable 1 View  Result Date: 01/22/2022 CLINICAL DATA:  Short of breath, history of pancreatic cancer and cirrhosis EXAM: PORTABLE CHEST 1 VIEW COMPARISON:  01/12/2022 FINDINGS: Single frontal view of the chest demonstrates a stable cardiac silhouette. Stable calcified left hilar lymph nodes. No airspace disease, effusion, or pneumothorax. No acute bony abnormalities. IMPRESSION: 1. No acute intrathoracic process. Electronically Signed   By:  Randa Ngo M.D.   On: 01/22/2022 17:59   [place] ASCITES (ABDOMEN LIMITED)  Result Date: 01/23/2022 CLINICAL DATA:  Ascites. EXAM: LIMITED ABDOMEN ULTRASOUND FOR ASCITES TECHNIQUE: Limited ultrasound survey for ascites was performed in  all four abdominal quadrants. COMPARISON:  Jan 22, 2022. FINDINGS: Minimal ascites is noted in all 4 quadrants of the abdomen. No adequate pocket for paracentesis is noted. IMPRESSION: Minimal ascites is noted. No adequate pocket for paracentesis is noted. The patient did undergo paracentesis yesterday. Electronically Signed   By: Marijo Conception M.D.   On: 01/23/2022 11:52      Subjective: Patient seen and examined at bedside.  No overnight fever, vomiting, worsening abdominal pain or distention reported.  Feels okay to go home today.  Discharge Exam: Vitals:   01/23/22 2220 01/24/22 0528  BP: 113/80 109/79  Pulse: 95 90  Resp: 16 16  Temp: 97.8 F (36.6 C) 97.9 F (36.6 C)  SpO2: 95% 95%    General: Pt is alert, awake, not in acute distress.  Looks chronically ill and deconditioned.  Poor historian.  Slow to respond.  Currently on room air. Cardiovascular: rate controlled, S1/S2 + Respiratory: bilateral decreased breath sounds at bases Abdominal: Soft, still distended, nontender, bowel sounds + Extremities: Trace lower extremity edema present; no cyanosis    The results of significant diagnostics from this hospitalization (including imaging, microbiology, ancillary and laboratory) are listed below for reference.     Microbiology: No results found for this or any previous visit (from the past 240 hour(s)).   Labs: BNP (last 3 results) Recent Labs    01/22/22 1727  BNP 56.2   Basic Metabolic Panel: Recent Labs  Lab 01/22/22 1726 01/22/22 2021 01/23/22 0000 01/23/22 0508 01/24/22 0511  NA 134*  --  135 135 135  K 2.5*  --  3.3* 3.5 3.5  CL 97*  --  100 100 103  CO2 29  --  '24 24 24  '$ GLUCOSE 113*  --  131* 92 92  BUN <5*  --  <5* <5* 7  CREATININE 0.57  --  0.51 0.53 0.57  CALCIUM 7.8*  --  7.5* 7.5* 8.1*  MG 1.4*  --  1.9 1.8 1.8  PHOS  --  2.4* 1.8* 2.0*  --    Liver Function Tests: Recent Labs  Lab 01/22/22 1726 01/23/22 0508 01/24/22 0511  AST 82*  80* 66*  ALT '22 19 19  '$ ALKPHOS 227* 220* 208*  BILITOT 7.1* 7.2* 10.2*  PROT 6.8 6.5 6.3*  ALBUMIN 2.4* 2.4* 2.3*   Recent Labs  Lab 01/22/22 1726  LIPASE 40   Recent Labs  Lab 01/22/22 1727 01/23/22 0508  AMMONIA 82* 133*   CBC: Recent Labs  Lab 01/21/22 0000 01/22/22 1726 01/23/22 0508 01/24/22 0511  WBC 2.8 2.9* 2.3* 2.6*  NEUTROABS 1.15 1.1*  --  1.3*  HGB 8.1* 8.5* 7.8* 7.6*  HCT 26* 26.2* 24.3* 23.7*  MCV  --  76.6* 75.7* 76.0*  PLT 27* 31* 21* 27*   Cardiac Enzymes: Recent Labs  Lab 01/22/22 2021  CKTOTAL 54   BNP: Invalid input(s): POCBNP CBG: No results for input(s): GLUCAP in the last 168 hours. D-Dimer No results for input(s): DDIMER in the last 72 hours. Hgb A1c No results for input(s): HGBA1C in the last 72 hours. Lipid Profile No results for input(s): CHOL, HDL, LDLCALC, TRIG, CHOLHDL, LDLDIRECT in the last 72 hours. Thyroid function studies Recent Labs    01/22/22 2025  TSH 2.450   Anemia work up Recent Labs    01/22/22 2021 01/22/22 2025  VITAMINB12 1,073*  --   FOLATE 14.2  --   FERRITIN 23  --   TIBC 308  --   IRON 41  --   RETICCTPCT  --  2.1   Urinalysis    Component Value Date/Time   COLORURINE YELLOW 01/22/2022 1815   APPEARANCEUR CLEAR 01/22/2022 1815   LABSPEC 1.004 (L) 01/22/2022 1815   PHURINE 9.0 (H) 01/22/2022 1815   GLUCOSEU NEGATIVE 01/22/2022 1815   HGBUR NEGATIVE 01/22/2022 1815   BILIRUBINUR NEGATIVE 01/22/2022 1815   BILIRUBINUR NEG 05/26/2020 1544   KETONESUR NEGATIVE 01/22/2022 1815   PROTEINUR NEGATIVE 01/22/2022 1815   UROBILINOGEN 1.0 05/26/2020 1544   UROBILINOGEN 0.2 12/02/2017 0836   NITRITE NEGATIVE 01/22/2022 1815   LEUKOCYTESUR NEGATIVE 01/22/2022 1815   Sepsis Labs Invalid input(s): PROCALCITONIN,  WBC,  LACTICIDVEN Microbiology No results found for this or any previous visit (from the past 240 hour(s)).   Time coordinating discharge: 35 minutes  SIGNED:   Aline August,  MD  [facility] 01/24/2022, 10:10 AM

## 2022-01-24 NOTE — Evaluation (Signed)
Physical Therapy Evaluation Patient Details Name: Briana Sweeney MRN: [id number] DOB: 11-07-1970 Today's Date: 01/24/2022  History of Present Illness  Patient is a 51 year old female who was admitted with liver failure without hepatic coma. patient underwent paracentesis on 5/16 with 4L removed.  PMH: bipolar/schizophrenia, EtOH abuse with cirrhosis, seizure disorder,  Clinical Impression  Pt admitted as above and presenting with functional mobility limitations 2* generalized weakness, mild ambulatory balance deficits and limited endurance.  This date, pt up to ambulate in halls including side stepping and stepping bkwd with one episode mild balance loss with initial attempt to step bkwd.  Pt states she feels close to her baseline for mobility and is hopeful for dc home this date with intermittent assist of family/friends.     Recommendations for follow up therapy are one component of a multi-disciplinary discharge planning process, led by the attending physician.  Recommendations may be updated based on patient status, additional functional criteria and insurance authorization.  Follow Up Recommendations No PT follow up    Assistance Recommended at Discharge Intermittent Supervision/Assistance  Patient can return home with the following  Help with stairs or ramp for entrance;Assist for transportation    Equipment Recommendations None recommended by PT  Recommendations for Other Services       Functional Status Assessment Patient has had a recent decline in their functional status and demonstrates the ability to make significant improvements in function in a reasonable and predictable amount of time.     Precautions / Restrictions Precautions Precautions: Fall Restrictions Weight Bearing Restrictions: No      Mobility  Bed Mobility Overal bed mobility: Modified Independent             General bed mobility comments: no physical assist    Transfers Overall transfer  level: Modified independent Equipment used: None Transfers: Sit to/from Stand Sit to Stand: Modified independent (Device/Increase time)           General transfer comment: no physical assist    Ambulation/Gait Ambulation/Gait assistance: Min guard, Supervision Gait Distance (Feet): 450 Feet Assistive device: None Gait Pattern/deviations: Step-through pattern, Decreased step length - right, Decreased step length - left, Shuffle, Trunk flexed, Wide base of support       General Gait Details: Pt with fwd posture and mild general instability compensating with widened BOS. One episode mild balance loss with initial attempt to step backwards  Financial trader Rankin (Stroke Patients Only)       Balance Overall balance assessment: Needs assistance Sitting-balance support: No upper extremity supported, Feet supported Sitting balance-Leahy Scale: Good     Standing balance support: No upper extremity supported Standing balance-Leahy Scale: Good                               Pertinent Vitals/Pain Pain Assessment Pain Assessment: No/denies pain    Home Living Family/patient expects to be discharged to:: Private residence Living Arrangements: Alone Available Help at Discharge: Family;Friend(s);Available PRN/intermittently Type of Home: Apartment Home Access: Elevator       Home Layout: One level Home Equipment: Grab bars - tub/shower;Grab bars - toilet      Prior Function Prior Level of Function : Independent/Modified Independent                     Hand Dominance   Dominant Hand:  Right    Extremity/Trunk Assessment   Upper Extremity Assessment Upper Extremity Assessment: Defer to OT evaluation RUE Deficits / Details: reports history of rotator cuff tear. patient able to AROM to about 80 degrees FF and ABduction LUE Deficits / Details: able to AROM to about 80 degrees FF and ABduction    Lower  Extremity Assessment Lower Extremity Assessment: Generalized weakness    Cervical / Trunk Assessment Cervical / Trunk Assessment: Normal  Communication   Communication: No difficulties  Cognition Arousal/Alertness: Awake/alert Behavior During Therapy: WFL for tasks assessed/performed, Flat affect Overall Cognitive Status: Within Functional Limits for tasks assessed                                          General Comments      Exercises     Assessment/Plan    PT Assessment Patient needs continued PT services  PT Problem List Decreased strength;Decreased balance;Decreased mobility       PT Treatment Interventions DME instruction;Gait training;Functional mobility training;Therapeutic activities;Therapeutic exercise;Balance training;Patient/family education    PT Goals (Current goals can be found in the Care Plan section)  Acute Rehab PT Goals Patient Stated Goal: HOME PT Goal Formulation: With patient Time For Goal Achievement: 01/31/22 Potential to Achieve Goals: Fair    Frequency Min 3X/week     Co-evaluation               AM-PAC PT "6 Clicks" Mobility  Outcome Measure Help needed turning from your back to your side while in a flat bed without using bedrails?: None Help needed moving from lying on your back to sitting on the side of a flat bed without using bedrails?: None Help needed moving to and from a bed to a chair (including a wheelchair)?: None Help needed standing up from a chair using your arms (e.g., wheelchair or bedside chair)?: None Help needed to walk in hospital room?: A Little Help needed climbing 3-5 steps with a railing? : A Little 6 Click Score: 22    End of Session Equipment Utilized During Treatment: Gait belt Activity Tolerance: Patient tolerated treatment well Patient left: in bed;with call bell/phone within reach;with bed alarm set Nurse Communication: Mobility status PT Visit Diagnosis: Unsteadiness on feet  (R26.81)    Time: 0940-1000 PT Time Calculation (min) (ACUTE ONLY): 20 min   Charges:   PT Evaluation $PT Eval Low Complexity: 1 Low          [place] Pager [phone] Office [phone]   Adreonna Yontz 01/24/2022, 10:51 AM

## 2022-01-24 NOTE — Progress Notes (Signed)
[employer] [facility]) Hospital Liaison note:  Notified via Epic workque from Dr. Aline August of request for [place] services. Will continue to follow for disposition.  Please call with any outpatient palliative questions or concerns.  Thank you for the opportunity to participate in this patient's care.  Thank you, Lorelee Market, LPN [facility] Liaison [phone]

## 2022-01-25 ENCOUNTER — Other Ambulatory Visit (HOSPITAL_COMMUNITY): Payer: Self-pay

## 2022-01-25 LAB — CBC WITH DIFFERENTIAL/PLATELET
Abs Immature Granulocytes: 0 10*3/uL (ref 0.00–0.07)
Basophils Absolute: 0 10*3/uL (ref 0.0–0.1)
Basophils Relative: 1 %
Eosinophils Absolute: 0.1 10*3/uL (ref 0.0–0.5)
Eosinophils Relative: 5 %
HCT: 23.7 % — ABNORMAL LOW (ref 36.0–46.0)
Hemoglobin: 7.6 g/dL — ABNORMAL LOW (ref 12.0–15.0)
Immature Granulocytes: 0 %
Lymphocytes Relative: 27 %
Lymphs Abs: 0.7 10*3/uL (ref 0.7–4.0)
MCH: 24.4 pg — ABNORMAL LOW (ref 26.0–34.0)
MCHC: 32.1 g/dL (ref 30.0–36.0)
MCV: 76 fL — ABNORMAL LOW (ref 80.0–100.0)
Monocytes Absolute: 0.5 10*3/uL (ref 0.1–1.0)
Monocytes Relative: 19 %
Neutro Abs: 1.3 10*3/uL — ABNORMAL LOW (ref 1.7–7.7)
Neutrophils Relative %: 48 %
Platelets: 27 10*3/uL — CL (ref 150–400)
RBC: 3.12 MIL/uL — ABNORMAL LOW (ref 3.87–5.11)
RDW: 27.5 % — ABNORMAL HIGH (ref 11.5–15.5)
WBC: 2.6 10*3/uL — ABNORMAL LOW (ref 4.0–10.5)
nRBC: 0 % (ref 0.0–0.2)

## 2022-01-25 MED ORDER — CLONAZEPAM 0.5 MG PO TABS
ORAL_TABLET | ORAL | 0 refills | Status: DC
  Filled 2022-01-25: qty 60, 30d supply, fill #0

## 2022-01-26 ENCOUNTER — Other Ambulatory Visit (HOSPITAL_COMMUNITY): Payer: Self-pay

## 2022-01-28 ENCOUNTER — Other Ambulatory Visit (HOSPITAL_COMMUNITY): Payer: Self-pay

## 2022-01-28 ENCOUNTER — Telehealth: Payer: Self-pay

## 2022-01-28 ENCOUNTER — Inpatient Hospital Stay: Payer: Commercial Managed Care - HMO

## 2022-01-28 DIAGNOSIS — D696 Thrombocytopenia, unspecified: Secondary | ICD-10-CM

## 2022-01-28 LAB — CBC AND DIFFERENTIAL
HCT: 29 — AB (ref 36–46)
Hemoglobin: 8.6 — AB (ref 12.0–16.0)
Neutrophils Absolute: 2.11
Platelets: 44 10*3/uL — AB (ref 150–400)
WBC: 3.7

## 2022-01-28 LAB — CBC: RBC: 3.63 — AB (ref 3.87–5.11)

## 2022-01-28 NOTE — Progress Notes (Signed)
Scheduled transportation with [facility] for DOS 05/30, 06/01, 06/05, and 06/07.

## 2022-01-28 NOTE — Telephone Encounter (Signed)
Spoke with patient regarding Palliative Care service. She declined services. Will cancel referral and notify referring provider.

## 2022-01-29 ENCOUNTER — Other Ambulatory Visit: Payer: Self-pay | Admitting: Pharmacist

## 2022-01-29 LAB — CULTURE, BODY FLUID W GRAM STAIN -BOTTLE: Culture: NO GROWTH

## 2022-01-30 ENCOUNTER — Inpatient Hospital Stay: Payer: Commercial Managed Care - HMO

## 2022-01-30 ENCOUNTER — Other Ambulatory Visit: Payer: Self-pay

## 2022-01-30 ENCOUNTER — Emergency Department (HOSPITAL_COMMUNITY): Payer: Commercial Managed Care - HMO

## 2022-01-30 ENCOUNTER — Other Ambulatory Visit (HOSPITAL_COMMUNITY): Payer: Self-pay

## 2022-01-30 ENCOUNTER — Encounter (HOSPITAL_COMMUNITY): Payer: Self-pay

## 2022-01-30 ENCOUNTER — Inpatient Hospital Stay (HOSPITAL_COMMUNITY)
Admission: EM | Admit: 2022-01-30 | Discharge: 2022-02-02 | DRG: 432 | Disposition: A | Discharge status: Home / Self Care | Payer: Commercial Managed Care - HMO | Attending: Internal Medicine | Admitting: Internal Medicine

## 2022-01-30 VITALS — BP 115/80 | HR 103 | Temp 98.0°F | Resp 18 | Ht 60.63 in | Wt 147.2 lb

## 2022-01-30 DIAGNOSIS — D61818 Other pancytopenia: Secondary | ICD-10-CM | POA: Diagnosis present

## 2022-01-30 DIAGNOSIS — K219 Gastro-esophageal reflux disease without esophagitis: Secondary | ICD-10-CM | POA: Diagnosis present

## 2022-01-30 DIAGNOSIS — I10 Essential (primary) hypertension: Secondary | ICD-10-CM | POA: Diagnosis present

## 2022-01-30 DIAGNOSIS — K449 Diaphragmatic hernia without obstruction or gangrene: Secondary | ICD-10-CM | POA: Diagnosis present

## 2022-01-30 DIAGNOSIS — I8511 Secondary esophageal varices with bleeding: Secondary | ICD-10-CM | POA: Diagnosis present

## 2022-01-30 DIAGNOSIS — E875 Hyperkalemia: Secondary | ICD-10-CM | POA: Diagnosis present

## 2022-01-30 DIAGNOSIS — F1994 Other psychoactive substance use, unspecified with psychoactive substance-induced mood disorder: Secondary | ICD-10-CM | POA: Diagnosis present

## 2022-01-30 DIAGNOSIS — Z87442 Personal history of urinary calculi: Secondary | ICD-10-CM

## 2022-01-30 DIAGNOSIS — R188 Other ascites: Secondary | ICD-10-CM | POA: Diagnosis present

## 2022-01-30 DIAGNOSIS — D693 Immune thrombocytopenic purpura: Secondary | ICD-10-CM | POA: Diagnosis present

## 2022-01-30 DIAGNOSIS — F419 Anxiety disorder, unspecified: Secondary | ICD-10-CM | POA: Diagnosis present

## 2022-01-30 DIAGNOSIS — I8510 Secondary esophageal varices without bleeding: Secondary | ICD-10-CM | POA: Diagnosis present

## 2022-01-30 DIAGNOSIS — Z8507 Personal history of malignant neoplasm of pancreas: Secondary | ICD-10-CM

## 2022-01-30 DIAGNOSIS — Z811 Family history of alcohol abuse and dependence: Secondary | ICD-10-CM

## 2022-01-30 DIAGNOSIS — R791 Abnormal coagulation profile: Secondary | ICD-10-CM | POA: Diagnosis present

## 2022-01-30 DIAGNOSIS — K7031 Alcoholic cirrhosis of liver with ascites: Secondary | ICD-10-CM | POA: Diagnosis not present

## 2022-01-30 DIAGNOSIS — D696 Thrombocytopenia, unspecified: Secondary | ICD-10-CM

## 2022-01-30 DIAGNOSIS — K766 Portal hypertension: Secondary | ICD-10-CM | POA: Diagnosis present

## 2022-01-30 DIAGNOSIS — Z79899 Other long term (current) drug therapy: Secondary | ICD-10-CM

## 2022-01-30 DIAGNOSIS — Z90710 Acquired absence of both cervix and uterus: Secondary | ICD-10-CM

## 2022-01-30 DIAGNOSIS — F1020 Alcohol dependence, uncomplicated: Secondary | ICD-10-CM | POA: Diagnosis present

## 2022-01-30 DIAGNOSIS — Z87440 Personal history of urinary (tract) infections: Secondary | ICD-10-CM

## 2022-01-30 DIAGNOSIS — G40909 Epilepsy, unspecified, not intractable, without status epilepticus: Secondary | ICD-10-CM

## 2022-01-30 DIAGNOSIS — F319 Bipolar disorder, unspecified: Secondary | ICD-10-CM | POA: Diagnosis present

## 2022-01-30 DIAGNOSIS — B1920 Unspecified viral hepatitis C without hepatic coma: Secondary | ICD-10-CM | POA: Diagnosis present

## 2022-01-30 DIAGNOSIS — G629 Polyneuropathy, unspecified: Secondary | ICD-10-CM | POA: Diagnosis present

## 2022-01-30 LAB — CBC WITH DIFFERENTIAL/PLATELET
Abs Immature Granulocytes: 0.01 10*3/uL (ref 0.00–0.07)
Basophils Absolute: 0 10*3/uL (ref 0.0–0.1)
Basophils Relative: 1 %
Eosinophils Absolute: 0.1 10*3/uL (ref 0.0–0.5)
Eosinophils Relative: 5 %
HCT: 27.1 % — ABNORMAL LOW (ref 36.0–46.0)
Hemoglobin: 8.1 g/dL — ABNORMAL LOW (ref 12.0–15.0)
Immature Granulocytes: 0 %
Lymphocytes Relative: 24 %
Lymphs Abs: 0.7 10*3/uL (ref 0.7–4.0)
MCH: 24.3 pg — ABNORMAL LOW (ref 26.0–34.0)
MCHC: 29.9 g/dL — ABNORMAL LOW (ref 30.0–36.0)
MCV: 81.4 fL (ref 80.0–100.0)
Monocytes Absolute: 0.5 10*3/uL (ref 0.1–1.0)
Monocytes Relative: 16 %
Neutro Abs: 1.6 10*3/uL — ABNORMAL LOW (ref 1.7–7.7)
Neutrophils Relative %: 54 %
Platelets: 61 10*3/uL — ABNORMAL LOW (ref 150–400)
RBC: 3.33 MIL/uL — ABNORMAL LOW (ref 3.87–5.11)
RDW: 30.6 % — ABNORMAL HIGH (ref 11.5–15.5)
WBC: 3 10*3/uL — ABNORMAL LOW (ref 4.0–10.5)
nRBC: 0 % (ref 0.0–0.2)

## 2022-01-30 LAB — COMPREHENSIVE METABOLIC PANEL
ALT: 26 U/L (ref 0–44)
AST: 120 U/L — ABNORMAL HIGH (ref 15–41)
Albumin: 2.1 g/dL — ABNORMAL LOW (ref 3.5–5.0)
Alkaline Phosphatase: 222 U/L — ABNORMAL HIGH (ref 38–126)
Anion gap: 5 (ref 5–15)
BUN: 7 mg/dL (ref 6–20)
CO2: 21 mmol/L — ABNORMAL LOW (ref 22–32)
Calcium: 7.6 mg/dL — ABNORMAL LOW (ref 8.9–10.3)
Chloride: 107 mmol/L (ref 98–111)
Creatinine, Ser: 0.44 mg/dL (ref 0.44–1.00)
GFR, Estimated: >60 mL/min (ref >60)
Glucose, Bld: 118 mg/dL — ABNORMAL HIGH (ref 70–99)
Potassium: 5.2 mmol/L — ABNORMAL HIGH (ref 3.5–5.1)
Sodium: 133 mmol/L — ABNORMAL LOW (ref 135–145)
Total Bilirubin: 5.6 mg/dL — ABNORMAL HIGH (ref 0.3–1.2)
Total Protein: 6.2 g/dL — ABNORMAL LOW (ref 6.5–8.1)

## 2022-01-30 LAB — PROTIME-INR
INR: 1.8 — ABNORMAL HIGH (ref 0.8–1.2)
Prothrombin Time: 20.9 seconds — ABNORMAL HIGH (ref 11.4–15.2)

## 2022-01-30 MED ORDER — ONDANSETRON HCL 4 MG PO TABS: 4.0000 mg | ORAL_TABLET | Freq: Four times a day (QID) | ORAL | Status: DC | PRN

## 2022-01-30 MED ORDER — PANTOPRAZOLE SODIUM 40 MG PO TBEC
40.0000 mg | DELAYED_RELEASE_TABLET | Freq: Every morning | ORAL | Status: DC
  Administered 2022-01-31 – 2022-02-02 (×3): 40 mg via ORAL
  Filled 2022-01-30 (×4): qty 1

## 2022-01-30 MED ORDER — ARIPIPRAZOLE 10 MG PO TABS
10.0000 mg | ORAL_TABLET | Freq: Every morning | ORAL | Status: DC
  Administered 2022-01-31 – 2022-02-02 (×3): 10 mg via ORAL
  Filled 2022-01-30 (×4): qty 1

## 2022-01-30 MED ORDER — SODIUM CHLORIDE 0.9% FLUSH
3.0000 mL | Freq: Two times a day (BID) | INTRAVENOUS | Status: DC
  Administered 2022-01-31 – 2022-02-02 (×5): 3 mL via INTRAVENOUS

## 2022-01-30 MED ORDER — FOLIC ACID 1 MG PO TABS
1.0000 mg | ORAL_TABLET | Freq: Every day | ORAL | Status: DC
  Administered 2022-01-31 – 2022-02-02 (×3): 1 mg via ORAL
  Filled 2022-01-30 (×4): qty 1

## 2022-01-30 MED ORDER — SODIUM ZIRCONIUM CYCLOSILICATE 10 G PO PACK
10.0000 g | PACK | Freq: Once | ORAL | Status: AC
  Administered 2022-01-31: 10 g via ORAL
  Filled 2022-01-30: qty 1

## 2022-01-30 MED ORDER — NADOLOL 20 MG PO TABS
20.0000 mg | ORAL_TABLET | Freq: Every morning | ORAL | Status: DC
  Administered 2022-01-31 – 2022-02-02 (×3): 20 mg via ORAL
  Filled 2022-01-30 (×3): qty 1

## 2022-01-30 MED ORDER — SENNOSIDES-DOCUSATE SODIUM 8.6-50 MG PO TABS: 1.0000 | ORAL_TABLET | Freq: Every evening | ORAL | Status: DC | PRN

## 2022-01-30 MED ORDER — FLUOXETINE HCL 20 MG PO CAPS
40.0000 mg | ORAL_CAPSULE | Freq: Every morning | ORAL | Status: DC
  Administered 2022-01-31 – 2022-02-02 (×3): 40 mg via ORAL
  Filled 2022-01-30 (×4): qty 2

## 2022-01-30 MED ORDER — ADULT MULTIVITAMIN W/MINERALS CH
1.0000 | ORAL_TABLET | Freq: Every day | ORAL | Status: DC
  Administered 2022-01-31 – 2022-02-02 (×3): 1 via ORAL
  Filled 2022-01-30 (×4): qty 1

## 2022-01-30 MED ORDER — LORAZEPAM 1 MG PO TABS: 1.0000 mg | ORAL_TABLET | ORAL | Status: DC | PRN

## 2022-01-30 MED ORDER — THIAMINE HCL 100 MG/ML IJ SOLN: 100.0000 mg | Freq: Every day | INTRAMUSCULAR | Status: DC

## 2022-01-30 MED ORDER — THIAMINE HCL 100 MG PO TABS
100.0000 mg | ORAL_TABLET | Freq: Every day | ORAL | Status: DC
  Administered 2022-01-31 – 2022-02-02 (×3): 100 mg via ORAL
  Filled 2022-01-30 (×4): qty 1

## 2022-01-30 MED ORDER — FUROSEMIDE 40 MG PO TABS
60.0000 mg | ORAL_TABLET | Freq: Every day | ORAL | Status: DC
  Administered 2022-01-31 – 2022-02-02 (×3): 60 mg via ORAL
  Filled 2022-01-30 (×3): qty 1

## 2022-01-30 MED ORDER — LEVETIRACETAM 500 MG PO TABS
750.0000 mg | ORAL_TABLET | Freq: Two times a day (BID) | ORAL | Status: DC
  Administered 2022-01-31 – 2022-02-02 (×6): 750 mg via ORAL
  Filled 2022-01-30 (×6): qty 1

## 2022-01-30 MED ORDER — ROMIPLOSTIM 250 MCG ~~LOC~~ SOLR
3.7500 ug/kg | Freq: Once | SUBCUTANEOUS | Status: AC
  Administered 2022-01-30: 250 ug via SUBCUTANEOUS
  Filled 2022-01-30: qty 0.5

## 2022-01-30 MED ORDER — LORAZEPAM 2 MG/ML IJ SOLN
1.0000 mg | INTRAMUSCULAR | Status: DC | PRN
  Administered 2022-02-01: 1 mg via INTRAVENOUS
  Filled 2022-01-30: qty 1

## 2022-01-30 MED ORDER — CLONAZEPAM 0.5 MG PO TABS
0.5000 mg | ORAL_TABLET | Freq: Two times a day (BID) | ORAL | Status: DC
  Administered 2022-01-31 – 2022-02-02 (×6): 0.5 mg via ORAL
  Filled 2022-01-30 (×7): qty 1

## 2022-01-30 MED ORDER — LACTULOSE 10 GM/15ML PO SOLN
20.0000 g | Freq: Three times a day (TID) | ORAL | Status: DC
  Administered 2022-01-31 – 2022-02-02 (×8): 20 g via ORAL
  Filled 2022-01-30 (×9): qty 30

## 2022-01-30 MED ORDER — ONDANSETRON HCL 4 MG/2ML IJ SOLN
4.0000 mg | Freq: Four times a day (QID) | INTRAMUSCULAR | Status: DC | PRN
Start: 2022-01-30 — End: 2022-02-02

## 2022-01-30 NOTE — Assessment & Plan Note (Signed)
Admitted with recurrent ascites.  No signs/symptoms of SBP on admission. -Ultrasound guided paracentesis ordered -Continue home Lasix, holding spironolactone with mild hyperkalemia

## 2022-01-30 NOTE — ED Triage Notes (Signed)
Pt c/o fall yesterday and injuring right knee. Pt c/o abdominal distention and bilateral lower extremity edema. Pt has hx of needing her abdomen drained, pancreatitic cancer, cirrhosis, enlarged spleen. Pt states she has not had alcohol in 2 days.

## 2022-01-30 NOTE — Assessment & Plan Note (Signed)
Chronic pancytopenia secondary to hepatic cirrhosis and alcohol use.  Labs stable without evidence of bleeding.  Continue to monitor.

## 2022-01-30 NOTE — ED Provider Notes (Signed)
[facility] DEPT Provider Note   CSN: [id number] Arrival date & time: 01/30/22  1814     History  Chief Complaint  Patient presents with   Abdomen Distention   Right Knee Pain    Briana Sweeney is a 51 y.o. female.  Patient with severe cirrhosis and history of pancreatic cancer.  She presents with abdominal pain.  She gets peritoneal taps frequently and the last one was a week ago.  The history is provided by the patient and medical records. No language interpreter was used.  Abdominal Pain Pain location:  Generalized Pain quality: aching   Pain radiates to:  Does not radiate Pain severity:  Moderate Onset quality:  Sudden Timing:  Constant Progression:  Worsening Chronicity:  Recurrent Context: not awakening from sleep   Relieved by:  Nothing Worsened by:  Nothing Associated symptoms: no chest pain, no cough, no diarrhea, no fatigue and no hematuria       Home Medications Prior to Admission medications   Medication Sig Start Date End Date Taking? Authorizing Provider  ARIPiprazole (ABILIFY) 10 MG tablet Take 1 tablet by mouth every day Patient taking differently: Take 10 mg by mouth every morning. 11/22/21  Yes   clonazePAM (KLONOPIN) 0.5 MG tablet Take 1 tablet by mouth 2 times every day 01/25/22  Yes   FLUoxetine (PROZAC) 40 MG capsule Take 1 capsule (40 mg total) by mouth daily. Patient taking differently: Take 40 mg by mouth every morning. 7/82/95  Yes   folic acid (FOLVITE) 1 MG tablet Take 1 tablet by mouth once daily 01/24/22  Yes Alekh, Kshitiz, MD  furosemide (LASIX) 40 MG tablet Take 1 and 1/2 tablets by mouth once daily. 01/24/22 02/27/22 Yes Aline August, MD  lactulose (CHRONULAC) 10 GM/15ML solution Take 30 mLs (20 g total) by mouth 3 (three) times daily. 01/24/22  Yes Aline August, MD  levETIRAcetam (KEPPRA) 750 MG tablet Take 1 tablet (750 mg total) by mouth 2 (two) times daily. 07/06/21  Yes Cameron Sprang, MD  nadolol  (CORGARD) 20 MG tablet Take 1 tablet by mouth daily Patient taking differently: Take 20 mg by mouth every morning. 12/10/21  Yes   pantoprazole (PROTONIX) 40 MG tablet Take 1 tablet (40 mg total) by mouth every morning. 01/24/22  Yes Aline August, MD  potassium chloride (KLOR-CON) 20 MEQ packet Take 1 packet by mouth daily for 14 days. 01/24/22 02/11/22 Yes Aline August, MD  spironolactone (ALDACTONE) 100 MG tablet Take 1 and 1/2 tablets by mouth once daily. 01/24/22 02/27/22 Yes Aline August, MD  thiamine 100 MG tablet Take 1 tablet by mouth daily. 01/25/22   Aline August, MD      Allergies    Other    Review of Systems   Review of Systems  Constitutional:  Negative for appetite change and fatigue.  HENT:  Negative for congestion, ear discharge and sinus pressure.   Eyes:  Negative for discharge.  Respiratory:  Negative for cough.   Cardiovascular:  Negative for chest pain.  Gastrointestinal:  Positive for abdominal pain. Negative for diarrhea.       Abdominal distention  Genitourinary:  Negative for frequency and hematuria.  Musculoskeletal:  Negative for back pain.  Skin:  Negative for rash.  Neurological:  Negative for seizures and headaches.  Psychiatric/Behavioral:  Negative for hallucinations.    Physical Exam Updated Vital Signs BP 106/87   Pulse 100   Temp 98 F (36.7 C) (Oral)   Resp 17  SpO2 94%  Physical Exam Vitals and nursing note reviewed.  Constitutional:      Appearance: She is well-developed.  HENT:     Head: Normocephalic.     Nose: Nose normal.  Eyes:     General: No scleral icterus.    Conjunctiva/sclera: Conjunctivae normal.  Neck:     Thyroid: No thyromegaly.  Cardiovascular:     Rate and Rhythm: Normal rate and regular rhythm.     Heart sounds: No murmur heard.   No friction rub. No gallop.  Pulmonary:     Breath sounds: No stridor. No wheezing or rales.  Chest:     Chest wall: No tenderness.  Abdominal:     General: There is distension.      Tenderness: There is abdominal tenderness. There is no rebound.  Musculoskeletal:        General: Normal range of motion.     Cervical back: Neck supple.  Lymphadenopathy:     Cervical: No cervical adenopathy.  Skin:    Findings: No erythema.  Neurological:     Mental Status: She is alert and oriented to person, place, and time.     Motor: No abnormal muscle tone.     Coordination: Coordination normal.  Psychiatric:        Behavior: Behavior normal.    ED Results / Procedures / Treatments   Labs (all labs ordered are listed, but only abnormal results are displayed) Labs Reviewed  CBC WITH DIFFERENTIAL/PLATELET - Abnormal; Notable for the following components:      Result Value   WBC 3.0 (*)    RBC 3.33 (*)    Hemoglobin 8.1 (*)    HCT 27.1 (*)    MCH 24.3 (*)    MCHC 29.9 (*)    RDW 30.6 (*)    Platelets 61 (*)    Neutro Abs 1.6 (*)    All other components within normal limits  COMPREHENSIVE METABOLIC PANEL - Abnormal; Notable for the following components:   Sodium 133 (*)    Potassium 5.2 (*)    CO2 21 (*)    Glucose, Bld 118 (*)    Calcium 7.6 (*)    Total Protein 6.2 (*)    Albumin 2.1 (*)    AST 120 (*)    Alkaline Phosphatase 222 (*)    Total Bilirubin 5.6 (*)    All other components within normal limits  PROTIME-INR - Abnormal; Notable for the following components:   Prothrombin Time 20.9 (*)    INR 1.8 (*)    All other components within normal limits    EKG None  Radiology CT ABDOMEN PELVIS WO CONTRAST  Result Date: 01/30/2022 CLINICAL DATA:  Acute nonlocalized abdominal pain. Fell yesterday. Bilateral lower extremity edema. History of paracentesis, pancreatic cancer, cirrhosis. EXAM: CT ABDOMEN AND PELVIS WITHOUT CONTRAST TECHNIQUE: Multidetector CT imaging of the abdomen and pelvis was performed following the standard protocol without IV contrast. RADIATION DOSE REDUCTION: This exam was performed according to the departmental dose-optimization  program which includes automated exposure control, adjustment of the mA and/or kV according to patient size and/or use of iterative reconstruction technique. COMPARISON:  12/07/2021 FINDINGS: Lower chest: Mild dependent atelectasis in the lung bases. Calcified granuloma on the left with calcified left hilar lymph nodes. Small esophageal hiatal hernia. Multiple prominent paraesophageal varices are better demonstrated on the previous contrast-enhanced study. Hepatobiliary: Diffuse fatty infiltration of the liver as seen previously. Nodular contour with enlarged lateral segment left lobe consistent with cirrhosis.  Cholelithiasis with a large stone in the gallbladder. No discrete inflammatory stranding. Mild gallbladder wall thickening is likely due to liver disease. Pancreas: Calcifications in the head of the pancreas likely related to chronic pancreatitis. No pancreatic ductal dilatation or focal lesion identified on noncontrast imaging. Spleen: Spleen is enlarged.  Calcified granulomas. Adrenals/Urinary Tract: No adrenal gland nodules. Kidneys are symmetrical. No hydronephrosis or hydroureter. Bladder is unremarkable. Stomach/Bowel: Stomach, small bowel, and colon are not abnormally distended. Stool fills the colon. Mild right colonic wall thickening may be due to portal venous colopathy. Appendix is normal. Vascular/Lymphatic: Scattered abdominal and retroperitoneal lymph nodes are likely related to liver disease. Reproductive: Uterus and bilateral adnexa are unremarkable. Other: Large amount of diffuse abdominal and pelvic ascites. No free air. Abdominal wall musculature appears intact. Musculoskeletal: No acute or significant osseous findings. IMPRESSION: 1. Changes of hepatic cirrhosis and fatty liver with splenomegaly, prominent upper abdominal and paraesophageal varices, and severe abdominal and pelvic ascites. 2. Small esophageal hiatal hernia. 3. Calcifications in the head of the pancreas likely relate to  chronic pancreatitis. 4. Mild colonic wall thickening may be due to portal hypertensive colopathy. 5. Cholelithiasis. Gallbladder wall thickening is likely due to liver disease. Electronically Signed   By: Lucienne Capers M.D.   On: 01/30/2022 20:19   DG Chest Port 1 View  Result Date: 01/30/2022 CLINICAL DATA:  Shortness of breath. Abdominal distention. Bilateral lower extremity edema. EXAM: PORTABLE CHEST 1 VIEW COMPARISON:  01/22/2022 FINDINGS: Heart size and pulmonary vascularity are normal. Shallow inspiration with elevation of the right hemidiaphragm. No edema or consolidation in the lungs. Calcified lymph nodes in the left hilum. No pleural effusions. No pneumothorax. Mediastinal contours appear intact. Degenerative changes in the shoulders. Postoperative change in the right shoulder. IMPRESSION: No active disease. Electronically Signed   By: Lucienne Capers M.D.   On: 01/30/2022 19:27    Procedures Procedures    Medications Ordered in ED Medications - No data to display  ED Course/ Medical Decision Making/ A&P Patient with cirrhosis.  I spoke with [facility] and they recommended medicine admission with peritoneal tap in the morning to relieve the pressure from the ascites and GI will see patient tomorrow                         Medical Decision Making Amount and/or Complexity of Data Reviewed Labs: ordered. Radiology: ordered.  Risk Decision regarding hospitalization.  This patient presents to the ED for concern of abdominal pain, this involves an extensive number of treatment options, and is a complaint that carries with it a high risk of complications and morbidity.  The differential diagnosis includes cirrhosis with ascites and just pain from distention,SBP   Co morbidities that complicate the patient evaluation  Cirrhosis   Additional history obtained:  Additional history obtained from patient External records from outside source obtained and reviewed including  hospital record   Lab Tests:  I Ordered, and personally interpreted labs.  The pertinent results include: CBC shows white count of 3, hemoglobin 8.1 and platelets 61.  Chemistry show potassium 5.2 and liver studies elevated AST 120 alkaline phos 222 and T. bili 5.7   Imaging Studies ordered:  I ordered imaging studies including CT abdomen I independently visualized and interpreted imaging which showed cirrhosis with ascites I agree with the radiologist interpretation   Cardiac Monitoring: / EKG:  The patient was maintained on a cardiac monitor.  I personally viewed and interpreted the cardiac  monitored which showed an underlying rhythm of: Normal sinus rhythm   Consultations Obtained:  I requested consultation with the GI and hospitalist,  and discussed lab and imaging findings as well as pertinent plan - they recommend: GI recommended admission with peritoneal tap in the a.m.  Hospitalist will admit   Problem List / ED Course / Critical interventions / Medication management  Cirrhosis with ascites no medicines ordered Reevaluation of the patient after these medicines showed that the patient stayed the same I have reviewed the patients home medicines and have made adjustments as needed   Social Determinants of Health:  Patient with cirrhosis secondary to alcohol abuse   Test / Admission - Considered:  Peritoneal tap which will be done tomorrow  Patient with cirrhosis and ascites.  She will be admitted to medicine with peritoneal tap in the morning and GI following        Final Clinical Impression(s) / ED Diagnoses Final diagnoses:  Alcoholic cirrhosis of liver with ascites [facility])    Rx / DC Orders ED Discharge Orders     None         Milton Ferguson, MD 02/02/22 1026

## 2022-01-30 NOTE — Assessment & Plan Note (Signed)
Reports ongoing alcohol use.  Last drink 2 days prior to admission.  Placed on CIWA protocol with Ativan as needed.  Continue home clonazepam 0.5 mg twice daily.

## 2022-01-30 NOTE — Hospital Course (Signed)
Briana Sweeney is a 51 y.o. 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

## 2022-01-30 NOTE — Assessment & Plan Note (Signed)
History of variceal bleeding resulting in hemorrhagic shock, admitted at [facility] in February 2023.  Denies any recent bleeding. -Continue nadolol 20 mg daily

## 2022-01-30 NOTE — Assessment & Plan Note (Signed)
Continue home Abilify, Prozac, clonazepam.

## 2022-01-30 NOTE — Assessment & Plan Note (Signed)
Mild with potassium 5.2.  Hold spironolactone and potassium supplement, continue Lasix as above.

## 2022-01-30 NOTE — Assessment & Plan Note (Signed)
- 

## 2022-01-30 NOTE — H&P (Signed)
History and Physical    Briana Sweeney ACZ:[id number] DOB: [birth year] DOA: 01/30/2022  PCP: Marco Collie, MD  Patient coming from: Home  I have personally briefly reviewed patient's old medical records in [place]  Chief Complaint: Abdominal distention  HPI: Briana Sweeney is a 51 y.o. female with medical history significant for end-stage alcoholic liver cirrhosis with ascites requiring recurrent paracentesis, esophageal varices with history of bleeding, portal hypertension, hepatitis C with sustained virologic response, chronic pancytopenia, ITP, severe alcohol dependence, history of seizures, depression/anxiety who presented to the ED for evaluation of increasing abdominal distention.  Patient recently admitted 5/16-5/18 for recurrent abdominal distention.  She underwent ultrasound-guided paracentesis on 5/16 with removal of 4 L peritoneal fluid.  Patient states over the last 3 days she has noticed recurrent increased abdominal swelling/distention.  She had an episode of none bilious nonbloody emesis earlier today.  She reports decreased urine output than expected.  She denies any diarrhea, chest pain, dyspnea.  Patient states that she continues to drink alcohol.  She says she has cut back to about 1 beer daily.  Last drink was 2 days prior to admission.  ED Course  Labs/Imaging on admission: I have personally reviewed following labs and imaging studies.  Initial vitals showed BP 115/80, pulse 103, RR 18, temp 98.0 F, SPO2 100% on room air.  Labs show WBC 3.0, hemoglobin 8.1, platelets 61,000, sodium 133, potassium 5.2, bicarb 21, BUN 7, creatinine 0.44, serum glucose 118, AST 120, ALT 26, alk phos 222, total bilirubin 5.6, INR 1.8.  Portable chest x-ray negative for focal consolidation, edema, effusion.  CT abdomen/pelvis without contrast shows changes of hepatic cirrhosis and fatty liver with splenomegaly, upper abdominal and paraesophageal varices, severe abdominal  and pelvic ascites.  EDP discussed with on-call [facility] who recommended medical admission and paracentesis.  The hospitalist service was consulted to admit for further evaluation and management.  Review of Systems: All systems reviewed and are negative except as documented in history of present illness above.   Past Medical History:  Diagnosis Date   Abscess of bursa of left elbow 09/23/2018   Acute hyperactive alcohol withdrawal delirium ([place]) 10/09/2017   Acute low back pain with bilateral sciatica    Acute metabolic encephalopathy 10/93/2355   Acute on chronic pancreatitis ([place]) 04/04/2021   AKI (acute kidney injury) (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   Ascites due to alcoholic cirrhosis ([place])    Bipolar affective disorder ([place])    With anxiety features   Bunionette of right foot 11/2019   Cirrhosis of liver ([place])    Due to alcohol and hepatitis C   Cocaine dependence without complication ([place]) 27/02/2375   Decompensated hepatic cirrhosis ([place]) 07/23/2017   Depression with anxiety    Epistaxis 01/20/2019   Erysipelas 09/13/2018   Esophageal 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 ([place])    Esophageal varices without bleeding (HCC)    ETOHism (HCC)    GERD (gastroesophageal reflux disease)    Head injury    Hematemesis 02/10/2018   Hepatitis C 2018   hepatitis c and alcohol related hepatitis   Heroin abuse ([place])    History of blood transfusion    "blood doesn't clot; I fell down and had to have a transfusion"   History of kidney stones    Menopause 2016   Migraine    "when I get really stressed" (09/01/2017)  Neuropathy 10/27/2018   Olecranon bursitis of left elbow    Other mixed anxiety disorders 10/27/2018   Overdose 08/22/2018   Pancytopenia ([place]) 07/23/2017   Polysubstance abuse ([place]) 04/08/2018   Portal hypertension ([place]) 03/18/2018   S/P foot  surgery, right 11/2019   Schizophrenia ([place])    Seizures ([place])    "when I run out of my RX; lots recently" (09/01/2017)   Suicidal ideation    Thrombocytopenia ([place])    Trichimoniasis 10/30/2017   Upper GI bleed 02/10/2018   Urinary urgency 05/2020   UTI (urinary tract infection) 06/02/2017    Past Surgical History:  Procedure Laterality Date   BUNIONECTOMY     ESOPHAGEAL BANDING  06/26/2021   Procedure: ESOPHAGEAL BANDING;  Surgeon: Jackquline Denmark, MD;  Location[facility] ENDOSCOPY;  Service: Endoscopy;;   ESOPHAGOGASTRODUODENOSCOPY N/A 09/03/2017   Procedure: ESOPHAGOGASTRODUODENOSCOPY (EGD);  Surgeon: Doran Stabler, MD;  Location: [facility];  Service: Gastroenterology;  Laterality: N/A;   ESOPHAGOGASTRODUODENOSCOPY (EGD) WITH PROPOFOL N/A 06/26/2021   Procedure: ESOPHAGOGASTRODUODENOSCOPY (EGD) WITH PROPOFOL;  Surgeon: Jackquline Denmark, MD;  Location[facility] ENDOSCOPY;  Service: Endoscopy;  Laterality: N/A;   FINGER FRACTURE SURGERY Left    "shattered my pinky"   FRACTURE SURGERY     I & D EXTREMITY Left 09/18/2018   Procedure: IRRIGATION AND DEBRIDEMENT EXTREMITY;  Surgeon: Leanora Cover, MD;  Location: [facility] ORS;  Service: Orthopedics;  Laterality: Left;   IR PARACENTESIS  07/23/2017   IR PARACENTESIS  07/2017   "did it twice in the same week" (09/01/2017)   IR PARACENTESIS  06/26/2021   SHOULDER OPEN ROTATOR CUFF REPAIR Right    TOOTH EXTRACTION  08/2019   TUBAL LIGATION     VAGINAL HYSTERECTOMY      Social History:  reports that she has never smoked. She has never used smokeless tobacco. She reports that she does not currently use alcohol after a past usage of about 6.0 standard drinks per week. She reports that she does not currently use drugs.  Allergies  Allergen Reactions   Other Other (See Comments)    Platelets: Rx chest pain, tremors, body aches    Family History  Problem Relation Age of Onset   Lung cancer Mother 69   Alcohol abuse Mother    Throat cancer Father  44     Prior to Admission medications   Medication Sig Start Date End Date Taking? Authorizing Provider  ARIPiprazole (ABILIFY) 10 MG tablet Take 1 tablet by mouth every day Patient taking differently: Take 10 mg by mouth every morning. 11/22/21  Yes   clonazePAM (KLONOPIN) 0.5 MG tablet Take 1 tablet by mouth 2 times every day 01/25/22  Yes   FLUoxetine (PROZAC) 40 MG capsule Take 1 capsule (40 mg total) by mouth daily. Patient taking differently: Take 40 mg by mouth every morning. 0/16/01  Yes   folic acid (FOLVITE) 1 MG tablet Take 1 tablet by mouth once daily 01/24/22  Yes Alekh, Kshitiz, MD  furosemide (LASIX) 40 MG tablet Take 1 and 1/2 tablets by mouth once daily. 01/24/22 02/27/22 Yes Aline August, MD  lactulose (CHRONULAC) 10 GM/15ML solution Take 30 mLs (20 g total) by mouth 3 (three) times daily. 01/24/22  Yes Aline August, MD  levETIRAcetam (KEPPRA) 750 MG tablet Take 1 tablet (750 mg total) by mouth 2 (two) times daily. 07/06/21  Yes Cameron Sprang, MD  nadolol (CORGARD) 20 MG tablet Take 1 tablet by mouth daily Patient taking differently: Take 20 mg by mouth  every morning. 12/10/21  Yes   pantoprazole (PROTONIX) 40 MG tablet Take 1 tablet (40 mg total) by mouth every morning. 01/24/22  Yes Aline August, MD  potassium chloride (KLOR-CON) 20 MEQ packet Take 1 packet by mouth daily for 14 days. 01/24/22 02/11/22 Yes Aline August, MD  spironolactone (ALDACTONE) 100 MG tablet Take 1 and 1/2 tablets by mouth once daily. 01/24/22 02/27/22 Yes Aline August, MD  thiamine 100 MG tablet Take 1 tablet by mouth daily. 01/25/22   Aline August, MD    Physical Exam: Vitals:   01/30/22 1900 01/30/22 1935 01/30/22 2112 01/30/22 2130  BP: 112/86 117/85 118/75 106/87  Pulse: (!) 103 (!) 101 (!) 106 100  Resp:  _0 Temp:      TempSrc:      SpO2: 95% 95% 94% 94%   Constitutional: Resting in bed, NAD, calm, comfortable Eyes: EOMI, scleral icterus ENMT: Mucous membranes are moist.  Posterior pharynx clear of any exudate or lesions.Normal dentition.  Neck: normal, supple, no masses. Respiratory: clear to auscultation bilaterally, no wheezing, no crackles. Normal respiratory effort. No accessory muscle use.  Cardiovascular: Regular rate and rhythm, no murmurs / rubs / gallops. No extremity edema. 2+ pedal pulses. Abdomen: Distended abdomen.  No tenderness. Musculoskeletal: no clubbing / cyanosis. No joint deformity upper and lower extremities. Good ROM, no contractures. Normal muscle tone.  Skin: no rashes, lesions, ulcers. No induration Neurologic: Sensation intact. Strength 5/5 in all 4.  Psychiatric: Alert and oriented x 3. Normal mood.   EKG: Not performed.  Assessment/Plan Principal Problem:   Alcoholic cirrhosis of liver with ascites and HCV infection Active Problems:   Pancytopenia (HCC)   Alcohol use disorder, severe, dependence (HCC)   Seizure disorder (HCC)   Substance induced mood disorder ([place])   Esophageal 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 ([place])   Hyperkalemia   Kataryna Nakyiah Kuck is a 51 y.o. 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  Assessment and Plan: * Alcoholic cirrhosis of liver with ascites and HCV infection Admitted with recurrent ascites.  No signs/symptoms of SBP on admission. -Ultrasound guided paracentesis ordered -Continue home Lasix, holding spironolactone with mild hyperkalemia  Pancytopenia (HCC) Chronic pancytopenia secondary to hepatic cirrhosis and alcohol use.  Labs stable without evidence of bleeding.  Continue to monitor.  Alcohol use disorder, severe, dependence ([place]) Reports ongoing alcohol use.  Last  drink 2 days prior to admission.  Placed on CIWA protocol with Ativan as needed.  Continue home clonazepam 0.5 mg twice daily.  Hyperkalemia Mild with potassium 5.2.  Hold spironolactone and potassium supplement, continue Lasix as above.  Esophageal 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 (HCC) History of variceal bleeding resulting in hemorrhagic shock, admitted at [facility] in February 2023.  Denies any recent bleeding. -Continue nadolol 20 mg daily  Substance induced mood disorder (HCC) Continue home Abilify, Prozac, clonazepam.  Seizure disorder ([place]) Continue home Keppra.  DVT prophylaxis: SCDs Start: 01/30/22 2244 Code Status: Full code, confirmed on admission Family Communication: Discussed with patient Disposition Plan: From home and likely discharge to home pending clinical progress Consults called: None Severity of Illness: The appropriate patient status for this patient is OBSERVATION. Observation status is judged to be reasonable and necessary in order to provide the required intensity of service to ensure the  patient's safety. The patient's presenting symptoms, physical exam findings, and initial radiographic and laboratory data in the context of their medical condition is felt to place them at decreased risk for further clinical deterioration. Furthermore, it is anticipated that the patient will be medically stable for discharge from the hospital within 2 midnights of admission.   Zada Finders MD [facility]  If 7PM-7AM, please contact night-coverage [email or web address]  01/30/2022, 11:05 PM

## 2022-01-30 NOTE — Patient Instructions (Signed)
Romiplostim injection ?What is this medication? ?ROMIPLOSTIM (roe mi PLOE stim) helps your body make more platelets. This medicine is used to treat low platelets caused by chronic idiopathic thrombocytopenic purpura (ITP) or a bone marrow syndrome caused by radiation sickness. ?This medicine may be used for other purposes; ask your health care provider or pharmacist if you have questions. ?COMMON BRAND NAME(S): Nplate ?What should I tell my care team before I take this medication? ?They need to know if you have any of these conditions: ?blood clots ?myelodysplastic syndrome ?an unusual or allergic reaction to romiplostim, mannitol, other medicines, foods, dyes, or preservatives ?pregnant or trying to get pregnant ?breast-feeding ?How should I use this medication? ?This medicine is injected under the skin. It is given by a health care provider in a hospital or clinic setting. ?A special MedGuide will be given to you before each treatment. Be sure to read this information carefully each time. ?Talk to your health care provider about the use of this medicine in children. While it may be prescribed for children as young as newborns for selected conditions, precautions do apply. ?Overdosage: If you think you have taken too much of this medicine contact a poison control center or emergency room at once. ?NOTE: This medicine is only for you. Do not share this medicine with others. ?What if I miss a dose? ?Keep appointments for follow-up doses. It is important not to miss your dose. Call your health care provider if you are unable to keep an appointment. ?What may interact with this medication? ?Interactions are not expected. ?This list may not describe all possible interactions. Give your health care provider a list of all the medicines, herbs, non-prescription drugs, or dietary supplements you use. Also tell them if you smoke, drink alcohol, or use illegal drugs. Some items may interact with your medicine. ?What should I  watch for while using this medication? ?Visit your health care provider for regular checks on your progress. You may need blood work done while you are taking this medicine. Your condition will be monitored carefully while you are receiving this medicine. It is important not to miss any appointments. ?What side effects may I notice from receiving this medication? ?Side effects that you should report to your doctor or health care professional as soon as possible: ?allergic reactions (skin rash, itching or hives; swelling of the face, lips, or tongue) ?bleeding (bloody or black, tarry stools; red or dark brown urine; spitting up blood or brown material that looks like coffee grounds; red spots on the skin; unusual bruising or bleeding from the eyes, gums, or nose) ?blood clot (chest pain; shortness of breath; pain, swelling, or warmth in the leg) ?stroke (changes in vision; confusion; trouble speaking or understanding; severe headaches; sudden numbness or weakness of the face, arm or leg; trouble walking; dizziness; loss of balance or coordination) ?Side effects that usually do not require medical attention (report to your doctor or health care professional if they continue or are bothersome): ?diarrhea ?dizziness ?headache ?joint pain ?muscle pain ?stomach pain ?trouble sleeping ?This list may not describe all possible side effects. Call your doctor for medical advice about side effects. You may report side effects to FDA at 1-800-FDA-1088. ?Where should I keep my medication? ?This medicine is given in a hospital or clinic. It will not be stored at home. ?NOTE: This sheet is a summary. It may not cover all possible information. If you have questions about this medicine, talk to your doctor, pharmacist, or health care provider. ??   2023 Elsevier/Gold Standard (2021-07-27 00:00:00) ? ?

## 2022-01-31 ENCOUNTER — Inpatient Hospital Stay (HOSPITAL_COMMUNITY): Payer: Commercial Managed Care - HMO

## 2022-01-31 DIAGNOSIS — Z8507 Personal history of malignant neoplasm of pancreas: Secondary | ICD-10-CM | POA: Diagnosis not present

## 2022-01-31 DIAGNOSIS — F1994 Other psychoactive substance use, unspecified with psychoactive substance-induced mood disorder: Secondary | ICD-10-CM | POA: Diagnosis present

## 2022-01-31 DIAGNOSIS — E875 Hyperkalemia: Secondary | ICD-10-CM | POA: Diagnosis present

## 2022-01-31 DIAGNOSIS — G629 Polyneuropathy, unspecified: Secondary | ICD-10-CM | POA: Diagnosis present

## 2022-01-31 DIAGNOSIS — K219 Gastro-esophageal reflux disease without esophagitis: Secondary | ICD-10-CM | POA: Diagnosis present

## 2022-01-31 DIAGNOSIS — G40909 Epilepsy, unspecified, not intractable, without status epilepticus: Secondary | ICD-10-CM | POA: Diagnosis present

## 2022-01-31 DIAGNOSIS — D61818 Other pancytopenia: Secondary | ICD-10-CM | POA: Diagnosis present

## 2022-01-31 DIAGNOSIS — Z87440 Personal history of urinary (tract) infections: Secondary | ICD-10-CM | POA: Diagnosis not present

## 2022-01-31 DIAGNOSIS — K449 Diaphragmatic hernia without obstruction or gangrene: Secondary | ICD-10-CM | POA: Diagnosis present

## 2022-01-31 DIAGNOSIS — F419 Anxiety disorder, unspecified: Secondary | ICD-10-CM | POA: Diagnosis present

## 2022-01-31 DIAGNOSIS — B1920 Unspecified viral hepatitis C without hepatic coma: Secondary | ICD-10-CM | POA: Diagnosis present

## 2022-01-31 DIAGNOSIS — I8511 Secondary esophageal varices with bleeding: Secondary | ICD-10-CM | POA: Diagnosis present

## 2022-01-31 DIAGNOSIS — K766 Portal hypertension: Secondary | ICD-10-CM | POA: Diagnosis present

## 2022-01-31 DIAGNOSIS — Z87442 Personal history of urinary calculi: Secondary | ICD-10-CM | POA: Diagnosis not present

## 2022-01-31 DIAGNOSIS — F1020 Alcohol dependence, uncomplicated: Secondary | ICD-10-CM | POA: Diagnosis present

## 2022-01-31 DIAGNOSIS — R791 Abnormal coagulation profile: Secondary | ICD-10-CM | POA: Diagnosis present

## 2022-01-31 DIAGNOSIS — F319 Bipolar disorder, unspecified: Secondary | ICD-10-CM | POA: Diagnosis present

## 2022-01-31 DIAGNOSIS — Z79899 Other long term (current) drug therapy: Secondary | ICD-10-CM | POA: Diagnosis not present

## 2022-01-31 DIAGNOSIS — K7460 Unspecified cirrhosis of liver: Secondary | ICD-10-CM | POA: Diagnosis not present

## 2022-01-31 DIAGNOSIS — Z90710 Acquired absence of both cervix and uterus: Secondary | ICD-10-CM | POA: Diagnosis not present

## 2022-01-31 DIAGNOSIS — K3189 Other diseases of stomach and duodenum: Secondary | ICD-10-CM | POA: Diagnosis not present

## 2022-01-31 DIAGNOSIS — I10 Essential (primary) hypertension: Secondary | ICD-10-CM | POA: Diagnosis present

## 2022-01-31 DIAGNOSIS — K7031 Alcoholic cirrhosis of liver with ascites: Secondary | ICD-10-CM | POA: Diagnosis present

## 2022-01-31 DIAGNOSIS — Z811 Family history of alcohol abuse and dependence: Secondary | ICD-10-CM | POA: Diagnosis not present

## 2022-01-31 DIAGNOSIS — D693 Immune thrombocytopenic purpura: Secondary | ICD-10-CM | POA: Diagnosis present

## 2022-01-31 DIAGNOSIS — R188 Other ascites: Secondary | ICD-10-CM | POA: Diagnosis present

## 2022-01-31 DIAGNOSIS — I8510 Secondary esophageal varices without bleeding: Secondary | ICD-10-CM | POA: Diagnosis not present

## 2022-01-31 LAB — COMPREHENSIVE METABOLIC PANEL
ALT: 20 U/L (ref 0–44)
AST: 80 U/L — ABNORMAL HIGH (ref 15–41)
Albumin: 2 g/dL — ABNORMAL LOW (ref 3.5–5.0)
Alkaline Phosphatase: 216 U/L — ABNORMAL HIGH (ref 38–126)
Anion gap: 5 (ref 5–15)
BUN: 7 mg/dL (ref 6–20)
CO2: 22 mmol/L (ref 22–32)
Calcium: 8.1 mg/dL — ABNORMAL LOW (ref 8.9–10.3)
Chloride: 109 mmol/L (ref 98–111)
Creatinine, Ser: 0.62 mg/dL (ref 0.44–1.00)
GFR, Estimated: >60 mL/min (ref >60)
Glucose, Bld: 103 mg/dL — ABNORMAL HIGH (ref 70–99)
Potassium: 3.6 mmol/L (ref 3.5–5.1)
Sodium: 136 mmol/L (ref 135–145)
Total Bilirubin: 5.7 mg/dL — ABNORMAL HIGH (ref 0.3–1.2)
Total Protein: 6.6 g/dL (ref 6.5–8.1)

## 2022-01-31 LAB — PROTIME-INR
INR: 1.8 — ABNORMAL HIGH (ref 0.8–1.2)
Prothrombin Time: 20.4 seconds — ABNORMAL HIGH (ref 11.4–15.2)

## 2022-01-31 LAB — GRAM STAIN: Gram Stain: NONE SEEN

## 2022-01-31 LAB — GLUCOSE, PLEURAL OR PERITONEAL FLUID: Glucose, Fluid: 124 mg/dL

## 2022-01-31 LAB — PROTEIN, PLEURAL OR PERITONEAL FLUID: Total protein, fluid: <3 g/dL

## 2022-01-31 LAB — CBC
HCT: 25.8 % — ABNORMAL LOW (ref 36.0–46.0)
Hemoglobin: 7.9 g/dL — ABNORMAL LOW (ref 12.0–15.0)
MCH: 24.3 pg — ABNORMAL LOW (ref 26.0–34.0)
MCHC: 30.6 g/dL (ref 30.0–36.0)
MCV: 79.4 fL — ABNORMAL LOW (ref 80.0–100.0)
Platelets: 50 10*3/uL — ABNORMAL LOW (ref 150–400)
RBC: 3.25 MIL/uL — ABNORMAL LOW (ref 3.87–5.11)
RDW: 31.2 % — ABNORMAL HIGH (ref 11.5–15.5)
WBC: 2.6 10*3/uL — ABNORMAL LOW (ref 4.0–10.5)
nRBC: 0 % (ref 0.0–0.2)

## 2022-01-31 LAB — LACTATE DEHYDROGENASE, PLEURAL OR PERITONEAL FLUID: LD, Fluid: 25 U/L — ABNORMAL HIGH (ref 3–23)

## 2022-01-31 LAB — BODY FLUID CELL COUNT WITH DIFFERENTIAL
Eos, Fluid: 0 %
Lymphs, Fluid: 37 %
Monocyte-Macrophage-Serous Fluid: 61 % (ref 50–90)
Neutrophil Count, Fluid: 2 % (ref 0–25)
Total Nucleated Cell Count, Fluid: 81 cu mm (ref 0–1000)

## 2022-01-31 LAB — ALBUMIN, PLEURAL OR PERITONEAL FLUID: Albumin, Fluid: <1.5 g/dL

## 2022-01-31 LAB — LACTATE DEHYDROGENASE: LDH: 157 U/L (ref 98–192)

## 2022-01-31 MED ORDER — IPRATROPIUM-ALBUTEROL 0.5-2.5 (3) MG/3ML IN SOLN: 3.0000 mL | RESPIRATORY_TRACT | Status: DC | PRN

## 2022-01-31 MED ORDER — TRAZODONE HCL 50 MG PO TABS: 50.0000 mg | ORAL_TABLET | Freq: Every evening | ORAL | Status: DC | PRN

## 2022-01-31 MED ORDER — SPIRONOLACTONE 25 MG PO TABS
150.0000 mg | ORAL_TABLET | Freq: Every day | ORAL | Status: DC
  Administered 2022-01-31 – 2022-02-02 (×3): 150 mg via ORAL
  Filled 2022-01-31 (×3): qty 6

## 2022-01-31 MED ORDER — HYDRALAZINE HCL 20 MG/ML IJ SOLN: 10.0000 mg | INTRAMUSCULAR | Status: DC | PRN

## 2022-01-31 MED ORDER — GUAIFENESIN 100 MG/5ML PO LIQD
5.0000 mL | ORAL | Status: DC | PRN
Start: 2022-01-31 — End: 2022-02-02

## 2022-01-31 MED ORDER — METOPROLOL TARTRATE 5 MG/5ML IV SOLN: 5.0000 mg | INTRAVENOUS | Status: DC | PRN

## 2022-01-31 MED ORDER — LIDOCAINE HCL 1 % IJ SOLN
INTRAMUSCULAR | Status: AC
  Administered 2022-01-31: 10 mL
  Filled 2022-01-31: qty 20

## 2022-01-31 NOTE — Consult Note (Addendum)
Referring Provider: Alvan Primary Care Physician:  Marco Collie, MD Primary Gastroenterologist:  Althia Forts  Reason for Consultation:  ETOH cirrhosis, ascites  HPI: Briana Sweeney is a 51 y.o. female with medical history significant for end-stage alcoholic liver cirrhosis with ascites requiring recurrent paracentesis, esophageal varices with history of bleeding, portal hypertension, hepatitis C with sustained virologic response, chronic pancytopenia, ITP, severe alcohol dependence, history of seizures, depression/anxiety who presented to the ED 5/24 for evaluation of increasing abdominal distention.   Patient notes she last had fluid removed 01/22/2022 where she had 4 L removed in the ED.  Prior to that she had 3.5 L removed 3-week prior.  Patient has no primary GI.  She has been receiving paracentesis care through hospital visits.   Patient recently had episode of large-volume variceal bleed February 2023.  Patient presented to do from an outside hospital with hemorrhagic shock, large bloody hematemesis requiring 4L IVF, 4 units pRBC, 2 units of platelets, 3 units FFP, 1 unit cryo, and was started on norepinephrine infusion, intubated for airway protection. On arrival to [facility], received an additional 2u pRBC, 1 unit platelets, 1 unit FFP and underwent emergent EGD in the ICU with GI which demonstrated large actively bleeding esophageal varices which were banded with resolution of bleeding and completely eradicated.   Today she reports some abdominal pain around her umbilicus.  Denies nausea, vomiting, diarrhea, constipation, melena, hematochezia, hematemesis.  Denies confusion.  Reports she takes her lactulose 3 times daily at home most days.  She is also taking her Aldactone 100 mg daily and furosemide 40 mg daily every day.  US liver 10/22/2021 The main portal vein is patent with mildly phasic but overall hepatopetal flow. There is apparent hepatofugal flow within the left portal vein.   Redemonstrated sonographic findings consistent with cirrhosis  EGD 06/26/2021 Grade 3 varices found in mid esophagus and distal esophagus status post EVL x6, mild portal hypertensive gastropathy and duodenopathy   Patient denies previous colonoscopy.  She reports she still drinks 1 beer daily.  Prior she was drinking half a gallon of vodka daily.  Denies any blood thinning medication.  Patient is on injections for platelets every Thursday.   Past Medical History:  Diagnosis Date   Abscess of bursa of left elbow 09/23/2018   Acute hyperactive alcohol withdrawal delirium ([place]) 10/09/2017   Acute low back pain with bilateral sciatica    Acute metabolic encephalopathy 64/40/3474   Acute on chronic pancreatitis ([place]) 04/04/2021   AKI (acute kidney injury) ([place]) 04/04/2021   Alcohol withdrawal syndrome with complication ([place]) 25/95/6387   Alcohol-induced acute pancreatitis    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[place]) 12/05/2018   Ascites due to alcoholic cirrhosis ([place])    Bipolar affective disorder ([place])    With anxiety features   Bunionette of right foot 11/2019   Cirrhosis of liver ([place])    Due to alcohol and hepatitis C   Cocaine dependence without complication ([place]) 56/43/3295   Decompensated hepatic cirrhosis ([place]) 07/23/2017   Depression with anxiety    Epistaxis 01/20/2019   Erysipelas 09/13/2018   Esophageal 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 ([place])    Esophageal varices without bleeding (HCC)    ETOHism (HCC)    GERD (gastroesophageal reflux disease)    Head injury    Hematemesis 02/10/2018   Hepatitis C 2018   hepatitis c and alcohol related hepatitis   Heroin abuse ([place])    History of blood transfusion    "blood doesn't clot; I fell down and had to have  a transfusion"   History of kidney stones    Menopause 2016   Migraine    "when I get really stressed" (09/01/2017)   Neuropathy 10/27/2018   Olecranon bursitis of left elbow    Other mixed anxiety disorders 10/27/2018   Overdose  08/22/2018   Pancytopenia ([place]) 07/23/2017   Polysubstance abuse ([place]) 04/08/2018   Portal hypertension ([place]) 03/18/2018   S/P foot surgery, right 11/2019   Schizophrenia ([place])    Seizures ([place])    "when I run out of my RX; lots recently" (09/01/2017)   Suicidal ideation    Thrombocytopenia ([place])    Trichimoniasis 10/30/2017   Upper GI bleed 02/10/2018   Urinary urgency 05/2020   UTI (urinary tract infection) 06/02/2017    Past Surgical History:  Procedure Laterality Date   BUNIONECTOMY     ESOPHAGEAL BANDING  06/26/2021   Procedure: ESOPHAGEAL BANDING;  Surgeon: Jackquline Denmark, MD;  Location[facility] ENDOSCOPY;  Service: Endoscopy;;   ESOPHAGOGASTRODUODENOSCOPY N/A 09/03/2017   Procedure: ESOPHAGOGASTRODUODENOSCOPY (EGD);  Surgeon: Doran Stabler, MD;  Location: [place];  Service: Gastroenterology;  Laterality: N/A;   ESOPHAGOGASTRODUODENOSCOPY (EGD) WITH PROPOFOL N/A 06/26/2021   Procedure: ESOPHAGOGASTRODUODENOSCOPY (EGD) WITH PROPOFOL;  Surgeon: Jackquline Denmark, MD;  Location: [facility] ENDOSCOPY;  Service: Endoscopy;  Laterality: N/A;   FINGER FRACTURE SURGERY Left    "shattered my pinky"   FRACTURE SURGERY     I & D EXTREMITY Left 09/18/2018   Procedure: IRRIGATION AND DEBRIDEMENT EXTREMITY;  Surgeon: Leanora Cover, MD;  Location: [facility] ORS;  Service: Orthopedics;  Laterality: Left;   IR PARACENTESIS  07/23/2017   IR PARACENTESIS  07/2017   "did it twice in the same week" (09/01/2017)   IR PARACENTESIS  06/26/2021   SHOULDER OPEN ROTATOR CUFF REPAIR Right    TOOTH EXTRACTION  08/2019   TUBAL LIGATION     VAGINAL HYSTERECTOMY      Prior to Admission medications   Medication Sig Start Date End Date Taking? Authorizing Provider  ARIPiprazole (ABILIFY) 10 MG tablet Take 1 tablet by mouth every day Patient taking differently: Take 10 mg by mouth every morning. 11/22/21  Yes   clonazePAM (KLONOPIN) 0.5 MG tablet Take 1 tablet by mouth 2 times every day 01/25/22  Yes   FLUoxetine  (PROZAC) 40 MG capsule Take 1 capsule (40 mg total) by mouth daily. Patient taking differently: Take 40 mg by mouth every morning. 3/79/02  Yes   folic acid (FOLVITE) 1 MG tablet Take 1 tablet by mouth once daily 01/24/22  Yes Alekh, Kshitiz, MD  furosemide (LASIX) 40 MG tablet Take 1 and 1/2 tablets by mouth once daily. 01/24/22 02/27/22 Yes Aline August, MD  lactulose (CHRONULAC) 10 GM/15ML solution Take 30 mLs (20 g total) by mouth 3 (three) times daily. 01/24/22  Yes Aline August, MD  levETIRAcetam (KEPPRA) 750 MG tablet Take 1 tablet (750 mg total) by mouth 2 (two) times daily. 07/06/21  Yes Cameron Sprang, MD  nadolol (CORGARD) 20 MG tablet Take 1 tablet by mouth daily Patient taking differently: Take 20 mg by mouth every morning. 12/10/21  Yes   pantoprazole (PROTONIX) 40 MG tablet Take 1 tablet (40 mg total) by mouth every morning. 01/24/22  Yes Aline August, MD  potassium chloride (KLOR-CON) 20 MEQ packet Take 1 packet by mouth daily for 14 days. 01/24/22 02/11/22 Yes Aline August, MD  spironolactone (ALDACTONE) 100 MG tablet Take 1 and 1/2 tablets by mouth once daily. 01/24/22 02/27/22 Yes Aline August, MD  thiamine 100 MG tablet Take 1 tablet by mouth daily. 01/25/22   Aline August, MD    Scheduled Meds:  ARIPiprazole  10 mg Oral q morning   clonazePAM  0.5 mg Oral BID   FLUoxetine  40 mg Oral q morning   folic acid  1 mg Oral Daily   furosemide  60 mg Oral Daily   lactulose  20 g Oral TID   levETIRAcetam  750 mg Oral BID   multivitamin with minerals  1 tablet Oral Daily   nadolol  20 mg Oral q morning   pantoprazole  40 mg Oral q morning   sodium chloride flush  3 mL Intravenous Q12H   spironolactone  150 mg Oral Daily   thiamine  100 mg Oral Daily   Or   thiamine  100 mg Intravenous Daily   Continuous Infusions: PRN Meds:.guaiFENesin, hydrALAZINE, ipratropium-albuterol, LORazepam **OR** LORazepam, metoprolol tartrate, ondansetron **OR** ondansetron (ZOFRAN) IV,  senna-docusate, traZODone  Allergies as of 01/30/2022 - Review Complete 01/30/2022  Allergen Reaction Noted   Other Other (See Comments) 12/15/2019    Family History  Problem Relation Age of Onset   Lung cancer Mother 64   Alcohol abuse Mother    Throat cancer Father 39    Social History   Socioeconomic History   Marital status: Divorced    Spouse name: Not on file   Number of children: 2   Years of education: Not on file   Highest education level: Not on file  Occupational History   Occupation: applying for disability  Tobacco Use   Smoking status: Never   Smokeless tobacco: Never  Vaping Use   Vaping Use: Never used  Substance and Sexual Activity   Alcohol use: Not Currently    Alcohol/week: 6.0 standard drinks    Types: 6 Cans of beer per week    Comment: weekly   Drug use: Not Currently   Sexual activity: Not on file  Other Topics Concern   Not on file  Social History Narrative   She moved with a boyfriend to [place] and was followed at [facility].  He died of a massive heart attack in 8/18, per her report, and so she moved back to [place] and is living with a friend.      Right handed   2 story home    Lives alone 11/09/19   Social Determinants of Health   Financial Resource Strain: Not on file  Food Insecurity: Not on file  Transportation Needs: Not on file  Physical Activity: Not on file  Stress: Not on file  Social Connections: Not on file  Intimate Partner Violence: Not on file    Review of Systems: Review of Systems  Constitutional:  Positive for malaise/fatigue. Negative for chills and fever.  HENT:  Negative for hearing loss and tinnitus.   Eyes:  Negative for blurred vision and double vision.  Respiratory:  Negative for cough and hemoptysis.   Cardiovascular:  Negative for chest pain and palpitations.  Gastrointestinal:  Positive for blood in stool and melena. Negative for abdominal pain, constipation, diarrhea, heartburn, nausea  and vomiting.  Genitourinary:  Negative for dysuria and urgency.  Musculoskeletal:  Negative for myalgias and neck pain.  Skin:  Negative for itching and rash.  Neurological:  Positive for weakness. Negative for dizziness and headaches.  Endo/Heme/Allergies:  Negative for environmental allergies. Does not bruise/bleed easily.  Psychiatric/Behavioral:  Negative for depression, substance abuse and suicidal ideas.     Physical  Exam:Physical Exam Constitutional:      General: She is not in acute distress.    Appearance: Normal appearance. She is normal weight. She is ill-appearing.  HENT:     Head: Normocephalic and atraumatic.     Right Ear: External ear normal.     Left Ear: External ear normal.     Nose: Nose normal.     Mouth/Throat:     Mouth: Mucous membranes are moist.  Eyes:     General: Scleral icterus present.     Pupils: Pupils are equal, round, and reactive to light.  Cardiovascular:     Rate and Rhythm: Normal rate and regular rhythm.     Pulses: Normal pulses.     Heart sounds: Normal heart sounds.  Pulmonary:     Effort: Pulmonary effort is normal.     Breath sounds: Normal breath sounds.  Abdominal:     General: Bowel sounds are normal. There is distension.     Palpations: There is no mass.     Tenderness: There is abdominal tenderness (mild). There is no guarding or rebound.     Hernia: No hernia is present.  Musculoskeletal:        General: Normal range of motion.     Cervical back: Normal range of motion and neck supple.  Skin:    General: Skin is warm and dry.     Coloration: Skin is jaundiced.  Neurological:     General: No focal deficit present.     Mental Status: She is alert and oriented to person, place, and time. Mental status is at baseline.     Comments: No asterixis  Psychiatric:        Mood and Affect: Mood normal.        Behavior: Behavior normal.    Vital signs: Vitals:   01/31/22 0005 01/31/22 0519  BP: (!) 115/93 125/84  Pulse: (!) 101  76  Resp: 20 20  Temp: 98 F (36.7 C) 98.1 F (36.7 C)  SpO2: 100% 95%   Last BM Date : 01/30/22    GI:  Lab Results: Recent Labs    01/30/22 1934 01/31/22 0401  WBC 3.0* 2.6*  HGB 8.1* 7.9*  HCT 27.1* 25.8*  PLT 61* 50*   BMET Recent Labs    01/30/22 1934 01/31/22 0401  NA 133* 136  K 5.2* 3.6  CL 107 109  CO2 21* 22  GLUCOSE 118* 103*  BUN 7 7  CREATININE 0.44 0.62  CALCIUM 7.6* 8.1*   LFT Recent Labs    01/31/22 0401  PROT 6.6  ALBUMIN 2.0*  AST 80*  ALT 20  ALKPHOS 216*  BILITOT 5.7*   PT/INR Recent Labs    01/30/22 1934 01/31/22 0401  LABPROT 20.9* 20.4*  INR 1.8* 1.8*     Studies/Results: CT ABDOMEN PELVIS WO CONTRAST  Result Date: 01/30/2022 CLINICAL DATA:  Acute nonlocalized abdominal pain. Fell yesterday. Bilateral lower extremity edema. History of paracentesis, pancreatic cancer, cirrhosis. EXAM: CT ABDOMEN AND PELVIS WITHOUT CONTRAST TECHNIQUE: Multidetector CT imaging of the abdomen and pelvis was performed following the standard protocol without IV contrast. RADIATION DOSE REDUCTION: This exam was performed according to the departmental dose-optimization program which includes automated exposure control, adjustment of the mA and/or kV according to patient size and/or use of iterative reconstruction technique. COMPARISON:  12/07/2021 FINDINGS: Lower chest: Mild dependent atelectasis in the lung bases. Calcified granuloma on the left with calcified left hilar lymph nodes. Small esophageal hiatal hernia. Multiple  prominent paraesophageal varices are better demonstrated on the previous contrast-enhanced study. Hepatobiliary: Diffuse fatty infiltration of the liver as seen previously. Nodular contour with enlarged lateral segment left lobe consistent with cirrhosis. Cholelithiasis with a large stone in the gallbladder. No discrete inflammatory stranding. Mild gallbladder wall thickening is likely due to liver disease. Pancreas: Calcifications in the  head of the pancreas likely related to chronic pancreatitis. No pancreatic ductal dilatation or focal lesion identified on noncontrast imaging. Spleen: Spleen is enlarged.  Calcified granulomas. Adrenals/Urinary Tract: No adrenal gland nodules. Kidneys are symmetrical. No hydronephrosis or hydroureter. Bladder is unremarkable. Stomach/Bowel: Stomach, small bowel, and colon are not abnormally distended. Stool fills the colon. Mild right colonic wall thickening may be due to portal venous colopathy. Appendix is normal. Vascular/Lymphatic: Scattered abdominal and retroperitoneal lymph nodes are likely related to liver disease. Reproductive: Uterus and bilateral adnexa are unremarkable. Other: Large amount of diffuse abdominal and pelvic ascites. No free air. Abdominal wall musculature appears intact. Musculoskeletal: No acute or significant osseous findings. IMPRESSION: 1. Changes of hepatic cirrhosis and fatty liver with splenomegaly, prominent upper abdominal and paraesophageal varices, and severe abdominal and pelvic ascites. 2. Small esophageal hiatal hernia. 3. Calcifications in the head of the pancreas likely relate to chronic pancreatitis. 4. Mild colonic wall thickening may be due to portal hypertensive colopathy. 5. Cholelithiasis. Gallbladder wall thickening is likely due to liver disease. Electronically Signed   By: Lucienne Capers M.D.   On: 01/30/2022 20:19   DG Chest Port 1 View  Result Date: 01/30/2022 CLINICAL DATA:  Shortness of breath. Abdominal distention. Bilateral lower extremity edema. EXAM: PORTABLE CHEST 1 VIEW COMPARISON:  01/22/2022 FINDINGS: Heart size and pulmonary vascularity are normal. Shallow inspiration with elevation of the right hemidiaphragm. No edema or consolidation in the lungs. Calcified lymph nodes in the left hilum. No pleural effusions. No pneumothorax. Mediastinal contours appear intact. Degenerative changes in the shoulders. Postoperative change in the right shoulder.  IMPRESSION: No active disease. Electronically Signed   By: Lucienne Capers M.D.   On: 01/30/2022 19:27    Impression: ETOH cirrhosis  HGB 7.9 Platelets 50 AST 80 ALT 20  Alkphos 216 TBili 5.7 GFR >60  INR 01/31/2022 1.8  MELD-Na score: 21 at 01/31/2022  4:01 AM MELD score: 20 at 01/31/2022  4:01 AM Calculated from: Serum Creatinine: 0.62 mg/dL (Using min of 1 mg/dL) at 01/31/2022  4:01 AM Serum Sodium: 136 mmol/L at 01/31/2022  4:01 AM Total Bilirubin: 5.7 mg/dL at 01/31/2022  4:01 AM INR(ratio): 1.8 at 01/31/2022  4:01 AM Age: 34 years Maddrey's DF: 42.5  Bilirubin improved from 10.2 one week ago.  Given Madrey score patient may benefit from steroids for alcoholic hepatitis.  Ascites She has recurrent ascites after last paracentesis 516 and 4 L removed.  Patient notes compliance with her diuretics.  Kidney function within normal limits.  May consider urine electrolytes to evaluate for ability to increase diuresis.  Paracentesis ordered will evaluate for SBP  History of esophageal variceal bleeding  History of bleeding esophageal varices 10/29/2021.  Patient will need repeat EGD with possible banding possibly during this hospitalization.  Patient's INR elevated at 1.8 INR will need to remain under 2 for procedure.   Plan: Continue supportive care and diuresis Plan for EGD tomorrow. I thoroughly discussed the procedures to include nature, alternatives, benefits, and risks including but not limited to bleeding, perforation, infection, anesthesia/cardiac and pulmonary complications. Patient provides understanding and gave verbal consent to proceed. NPO at midnight [facility]  will follow.    Consider increasing diuresis we will continue to evaluate May consider steroids for alcoholic hepatitis if no evidence of infection on paracentesis.   LOS: 0 days   Charlott Rakes  PA-C 01/31/2022, 8:23 AM  Contact #  [phone]

## 2022-01-31 NOTE — TOC Initial Note (Signed)
Transition of Care [facility]) - Initial/Assessment Note    Patient Details  Name: Briana Sweeney MRN: [id number] Date of Birth: [birth year]  Transition of Care [facility]) CM/SW Contact:    Leeroy Cha, RN Phone Number: 01/31/2022, 8:37 AM  Clinical Narrative:                  Transition of Care [facility]) Screening Note   Patient Details  Name: Briana Sweeney Date of Birth: 10/13/1970   Transition of Care [facility]) CM/SW Contact:    Leeroy Cha, RN Phone Number: 01/31/2022, 8:37 AM    Transition of Care Department [facility]) has reviewed patient and no TOC needs have been identified at this time. We will continue to monitor patient advancement through interdisciplinary progression rounds. If new patient transition needs arise, please place a TOC consult.    Expected Discharge Plan: Home/Self Care Barriers to Discharge: No Barriers Identified   Patient Goals and CMS Choice Patient states their goals for this hospitalization and ongoing recovery are:: to go home CMS Medicare.gov Compare Post Acute Care list provided to:: Patient Choice offered to / list presented to : Patient  Expected Discharge Plan and Services Expected Discharge Plan: Home/Self Care   Discharge Planning Services: CM Consult                                          Prior Living Arrangements/Services     Patient language and need for interpreter reviewed:: Yes Do you feel safe going back to the place where you live?: Yes            Criminal Activity/Legal Involvement Pertinent to Current Situation/Hospitalization: No - Comment as needed  Activities of Daily Living Home Assistive Devices/Equipment: None ADL Screening (condition at time of admission) Patient's cognitive ability adequate to safely complete daily activities?: Yes Is the patient deaf or have difficulty hearing?: No Does the patient have difficulty seeing, even when wearing glasses/contacts?: No Does the patient have  difficulty concentrating, remembering, or making decisions?: No Patient able to express need for assistance with ADLs?: No Does the patient have difficulty dressing or bathing?: No Independently performs ADLs?: Yes (appropriate for developmental age) Does the patient have difficulty walking or climbing stairs?: No Weakness of Legs: None Weakness of Arms/Hands: None  Permission Sought/Granted                  Emotional Assessment Appearance:: Appears stated age     Orientation: : Oriented to Self, Oriented to Place, Oriented to  Time, Oriented to Situation Alcohol / Substance Use: Not Applicable Psych Involvement: No (comment)  Admission diagnosis:  Alcoholic cirrhosis of liver with ascites ([place]) [K70.31] Patient Active Problem List   Diagnosis Date Noted   Hyperkalemia 01/30/2022   Cirrhosis of liver ([place]) 01/23/2022   Liver failure without hepatic coma ([place]) 01/22/2022   Hypophosphatemia 01/22/2022   Severe neutropenia ([place]) 12/16/2021   Cellulitis 12/16/2021   Mild protein malnutrition ([place]) 12/15/2021   Anasarca 11/01/2021   Acute posthemorrhagic anemia    Esophageal 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 ([place])    Abdominal pain 10/31/2021   Palpitations 10/11/2021   Bradycardia 07/23/2021   Secondary esophageal varices with bleeding ([place])    Alcohol withdrawal ([place]) 06/26/2021   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[place]) 12/18/2020   Chronic anemia 10/02/2020   Hyponatremia 04/02/2019   Other mixed anxiety disorders 10/27/2018  Neuropathy 10/27/2018   Substance induced mood disorder ([place]) 07/17/2018   Encephalopathy, portal systemic 04/08/2018   Seizure disorder ([place]) 03/18/2018   Hepatitis C 03/18/2018   Portal hypertension ([place]) 03/18/2018   Depression 03/18/2018   Thrombocytopenia ([place]) 01/02/2018   GERD (gastroesophageal reflux disease) 11/01/2017   Alcohol use disorder, severe, dependence ([place]) 10/29/2017   Alcohol abuse with alcohol-induced mood disorder ([place])     Esophageal varices without bleeding ([place])    Hematemesis 09/01/2017   Abdominal ascites    Hypokalemia 07/23/2017   Coagulopathy ([place]) 07/23/2017   Hypomagnesemia 07/23/2017   Pancytopenia ([place]) 54/56/2563   Alcoholic cirrhosis of liver with ascites and HCV infection 07/23/2017   PCP:  Marco Collie, MD Pharmacy:   [facility] [place] [address] Phone: [phone] Fax: [phone]  [facility] [place], [address] AT [address] [place] [address] Phone: [phone] Fax: [phone]     Social Determinants of Health ([place]) Interventions    Readmission Risk Interventions    07/06/2019    3:09 PM  Readmission Risk Prevention Plan  Transportation Screening Complete  Medication Review ([place]) Complete  PCP or Specialist appointment within 3-5 days of discharge Complete  HRI or [place] Complete  SW Recovery Care/Counseling Consult Complete  [place] Not Applicable

## 2022-01-31 NOTE — Progress Notes (Signed)
   01/31/22 0902  Assess: MEWS Score  Temp 98.5 F (36.9 C)  BP 124/88  Pulse Rate (!) 117  ECG Heart Rate (!) 116  Resp (!) 22  Level of Consciousness Alert  SpO2 97 %  O2 Device Room Air  Assess: MEWS Score  MEWS Temp 0  MEWS Systolic 0  MEWS Pulse 2  MEWS RR 1  MEWS LOC 0  MEWS Score 3  MEWS Score Color Yellow  Assess: if the MEWS score is Yellow or Red  Were vital signs taken at a resting state? Yes  Focused Assessment No change from prior assessment  Does the patient meet 2 or more of the SIRS criteria? No  MEWS guidelines implemented *See Row Information* Yes  Treat  MEWS Interventions Other (Comment) (monitoring pt per MEWS protocol)  Pain Scale 0-10  Pain Score 0  Take Vital Signs  Increase Vital Sign Frequency  Yellow: Q 2hr X 2 then Q 4hr X 2, if remains yellow, continue Q 4hrs  Escalate  MEWS: Escalate Yellow: discuss with charge nurse/RN and consider discussing with provider and RRT  Notify: Charge Nurse/RN  Name of Charge Nurse/RN Notified Sharyn Blitz Charge RN  Date Charge Nurse/RN Notified 01/31/22  Time Charge Nurse/RN Notified 1610  Notify: Provider  Provider Name/Title Dr Gerlean Ren  Date Provider Notified 01/31/22  Time Provider Notified [phone]  Method of Notification Rounds  Notification Reason Other (Comment) (yellow MEWS)  Provider response No new orders;In department  Date of Provider Response 01/31/22  Time of Provider Response [phone]  Notify: Rapid Response  Name of Rapid Response RN Notified Not applicable  Assess: SIRS CRITERIA  SIRS Temperature  0  SIRS Pulse 1  SIRS Respirations  1  SIRS WBC 1  SIRS Score Sum  3   Patient is resting.  No distress, no complaints at this time.

## 2022-01-31 NOTE — Progress Notes (Signed)
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  Patient was recently admitted about a week ago for abdominal distention as well requiring paracentesis.  He started developing abdominal distention again 3 days prior to admission.  Patient continues to drink.  CT showed severe ascites again.  ED spoke with [facility] who recommended admitting the patient for paracentesis.   Assessment & Plan:  Principal Problem:   Alcoholic cirrhosis of liver with ascites and HCV infection Active Problems:   Pancytopenia (HCC)   Alcohol use disorder, severe, dependence (HCC)   Seizure disorder (HCC)   Substance induced mood disorder (HCC)   Esophageal 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 (HCC)   Hyperkalemia     Assessment and Plan: * Alcoholic cirrhosis of liver with ascites and HCV infection, decompensated Pancytopenia, esophageal varices -Patient continues to drink alcohol.  Recently had paracentesis, readmitted for recurrent ascites. -Ultrasound-guided paracentesis ordered along with fluid studies - Continue home Lasix.  We will also resume Aldactone given hypercalcemia has resolved.  Wonder if she is a candidate for TIPS procedure but will defer this to gastroenterology and their input. -Continue to monitor platelets. -Continue nadolol 20 mg daily.  Does have history of variceal bleeding and was admitted at [facility] in February 2023 resulting in hemorrhagic shock. -Lactulose 3 times daily. -PPI daily  Alcohol use disorder, severe, dependence ([place]) -Unfortunately patient continues to drink alcohol daily.  Currently on alcohol withdrawal protocol.  On clonazepam twice daily.  Hyperkalemia Resolved.  Continue Lasix,  resume home Aldactone  Substance induced mood disorder (HCC) Continue home Abilify, Prozac, clonazepam.  Seizure disorder ([place]) Continue home Keppra.   DVT prophylaxis: SCDs Start: 01/30/22 2244 Code Status: Full code Family Communication:     Subjective: Patient seen and examined at bedside.  Tells me recently she lost a grandchild therefore she has picked up her drinking again.  She drinks about 40 ounces of beer daily.  She has been doing much more than this for the past 15 years.  She admits to medication compliance but tells me her fluid keeps on coming back.   Examination: Constitutional: Not in acute distress Respiratory: Clear to auscultation bilaterally Cardiovascular: Normal sinus rhythm, no rubs Abdomen: Abdomen is significantly distended with fluid wave shift.  Does have good bowel sounds. Musculoskeletal: 2+ bilateral lower extremity pitting edema. Skin: No rashes seen Neurologic: CN 2-12 grossly intact.  And nonfocal Psychiatric: Normal judgment and insight. Alert and oriented x 3. Normal mood.  Objective: Vitals:   01/31/22 0005 01/31/22 0046 01/31/22 0500 01/31/22 0519  BP: (!) 115/93   125/84  Pulse: (!) 101   76  Resp: 20   20  Temp: 98 F (36.7 C)   98.1 F (36.7 C)  TempSrc: Oral   Oral  SpO2: 100%   95%  Weight:  66.6 kg 66.6 kg   Height:  '4\' 11"'$  (1.499 m)      Intake/Output Summary (Last 24 hours) at 01/31/2022 0759 Last data filed at 01/31/2022 0723 Gross per 24 hour  Intake --  Output 100 ml  Net -100 ml   Filed Weights   01/31/22 0046 01/31/22 0500  Weight: 66.6 kg 66.6 kg     Data Reviewed:   CBC: Recent Labs  Lab 01/28/22 0000 01/30/22  1934 01/31/22 0401  WBC 3.7 3.0* 2.6*  NEUTROABS 2.11 1.6*  --   HGB 8.6* 8.1* 7.9*  HCT 29* 27.1* 25.8*  MCV  --  81.4 79.4*  PLT 44* 61* 50*   Basic Metabolic Panel: Recent Labs  Lab 01/30/22 1934 01/31/22 0401  NA 133* 136  K 5.2* 3.6  CL 107 109  CO2 21* 22  GLUCOSE 118* 103*   BUN 7 7  CREATININE 0.44 0.62  CALCIUM 7.6* 8.1*   GFR: Estimated Creatinine Clearance: 70.6 mL/min (by C-G formula based on SCr of 0.62 mg/dL). Liver Function Tests: Recent Labs  Lab 01/30/22 1934 01/31/22 0401  AST 120* 80*  ALT 26 20  ALKPHOS 222* 216*  BILITOT 5.6* 5.7*  PROT 6.2* 6.6  ALBUMIN 2.1* 2.0*   No results for input(s): LIPASE, AMYLASE in the last 168 hours. No results for input(s): AMMONIA in the last 168 hours. Coagulation Profile: Recent Labs  Lab 01/30/22 1934 01/31/22 0401  INR 1.8* 1.8*   Cardiac Enzymes: No results for input(s): CKTOTAL, CKMB, CKMBINDEX, TROPONINI in the last 168 hours. BNP (last 3 results) No results for input(s): PROBNP in the last 8760 hours. HbA1C: No results for input(s): HGBA1C in the last 72 hours. CBG: No results for input(s): GLUCAP in the last 168 hours. Lipid Profile: No results for input(s): CHOL, HDL, LDLCALC, TRIG, CHOLHDL, LDLDIRECT in the last 72 hours. Thyroid Function Tests: No results for input(s): TSH, T4TOTAL, FREET4, T3FREE, THYROIDAB in the last 72 hours. Anemia Panel: No results for input(s): VITAMINB12, FOLATE, FERRITIN, TIBC, IRON, RETICCTPCT in the last 72 hours. Sepsis Labs: No results for input(s): PROCALCITON, LATICACIDVEN in the last 168 hours.  Recent Results (from the past 240 hour(s))  Culture, body fluid w Gram Stain-bottle     Status: None   Collection Time: 01/22/22  5:08 PM   Specimen: Fluid  Result Value Ref Range Status   Specimen Description FLUID PERITONEAL  Final   Special Requests BOTTLES DRAWN AEROBIC AND ANAEROBIC  Final   Culture   Final    NO GROWTH 5 DAYS Performed at [facility], [place] [address], [place], [place] [address]    Report Status 01/29/2022 FINAL  Final  Gram stain     Status: None   Collection Time: 01/22/22  5:08 PM   Specimen: Fluid  Result Value Ref Range Status   Specimen Description FLUID PERITONEAL  Final   Special Requests NONE  Final   Gram  Stain   Final    RARE WBC PRESENT, PREDOMINANTLY MONONUCLEAR NO ORGANISMS SEEN Performed at [facility], [place] [address] [place], [place] [address]    Report Status 01/24/2022 FINAL  Final         Radiology Studies: CT ABDOMEN PELVIS WO CONTRAST  Result Date: 01/30/2022 CLINICAL DATA:  Acute nonlocalized abdominal pain. Fell yesterday. Bilateral lower extremity edema. History of paracentesis, pancreatic cancer, cirrhosis. EXAM: CT ABDOMEN AND PELVIS WITHOUT CONTRAST TECHNIQUE: Multidetector CT imaging of the abdomen and pelvis was performed following the standard protocol without IV contrast. RADIATION DOSE REDUCTION: This exam was performed according to the departmental dose-optimization program which includes automated exposure control, adjustment of the mA and/or kV according to patient size and/or use of iterative reconstruction technique. COMPARISON:  12/07/2021 FINDINGS: Lower chest: Mild dependent atelectasis in the lung bases. Calcified granuloma on the left with calcified left hilar lymph nodes. Small esophageal hiatal hernia. Multiple prominent paraesophageal varices are better demonstrated on the previous  contrast-enhanced study. Hepatobiliary: Diffuse fatty infiltration of the liver as seen previously. Nodular contour with enlarged lateral segment left lobe consistent with cirrhosis. Cholelithiasis with a large stone in the gallbladder. No discrete inflammatory stranding. Mild gallbladder wall thickening is likely due to liver disease. Pancreas: Calcifications in the head of the pancreas likely related to chronic pancreatitis. No pancreatic ductal dilatation or focal lesion identified on noncontrast imaging. Spleen: Spleen is enlarged.  Calcified granulomas. Adrenals/Urinary Tract: No adrenal gland nodules. Kidneys are symmetrical. No hydronephrosis or hydroureter. Bladder is unremarkable. Stomach/Bowel: Stomach, small bowel, and colon are not abnormally distended. Stool fills the  colon. Mild right colonic wall thickening may be due to portal venous colopathy. Appendix is normal. Vascular/Lymphatic: Scattered abdominal and retroperitoneal lymph nodes are likely related to liver disease. Reproductive: Uterus and bilateral adnexa are unremarkable. Other: Large amount of diffuse abdominal and pelvic ascites. No free air. Abdominal wall musculature appears intact. Musculoskeletal: No acute or significant osseous findings. IMPRESSION: 1. Changes of hepatic cirrhosis and fatty liver with splenomegaly, prominent upper abdominal and paraesophageal varices, and severe abdominal and pelvic ascites. 2. Small esophageal hiatal hernia. 3. Calcifications in the head of the pancreas likely relate to chronic pancreatitis. 4. Mild colonic wall thickening may be due to portal hypertensive colopathy. 5. Cholelithiasis. Gallbladder wall thickening is likely due to liver disease. Electronically Signed   By: Lucienne Capers M.D.   On: 01/30/2022 20:19   DG Chest Port 1 View  Result Date: 01/30/2022 CLINICAL DATA:  Shortness of breath. Abdominal distention. Bilateral lower extremity edema. EXAM: PORTABLE CHEST 1 VIEW COMPARISON:  01/22/2022 FINDINGS: Heart size and pulmonary vascularity are normal. Shallow inspiration with elevation of the right hemidiaphragm. No edema or consolidation in the lungs. Calcified lymph nodes in the left hilum. No pleural effusions. No pneumothorax. Mediastinal contours appear intact. Degenerative changes in the shoulders. Postoperative change in the right shoulder. IMPRESSION: No active disease. Electronically Signed   By: Lucienne Capers M.D.   On: 01/30/2022 19:27        Scheduled Meds:  ARIPiprazole  10 mg Oral q morning   clonazePAM  0.5 mg Oral BID   FLUoxetine  40 mg Oral q morning   folic acid  1 mg Oral Daily   furosemide  60 mg Oral Daily   lactulose  20 g Oral TID   levETIRAcetam  750 mg Oral BID   multivitamin with minerals  1 tablet Oral Daily   nadolol   20 mg Oral q morning   pantoprazole  40 mg Oral q morning   sodium chloride flush  3 mL Intravenous Q12H   thiamine  100 mg Oral Daily   Or   thiamine  100 mg Intravenous Daily   Continuous Infusions:   LOS: 0 days   Time spent= 35 mins    Chiquetta Langner Arsenio Loader, MD [facility]  If 7PM-7AM, please contact night-coverage  01/31/2022, 7:59 AM

## 2022-01-31 NOTE — Progress Notes (Signed)
Patient received back from radiology for US guided paracentesis.  Band aid noted on LLQ, dry and intact.  Patient denies pain, verbalized relief from fluid removal.  Needs addressed.

## 2022-01-31 NOTE — Procedures (Signed)
PROCEDURE SUMMARY:  Successful ultrasound guided paracentesis from the left  lower quadrant.  Yielded 3.6 liters of yellow fluid.  No immediate complications.  The patient tolerated the procedure well.   Specimen was sent for labs.  EBL < 44m  Pt has been evaluated by IR portal HTN team for possible TIPS and now waiting for pt to establish routine care with GI team; see note from Dr. SSerafina Royalsdated 12/17/21   Briana Sweeney

## 2022-02-01 ENCOUNTER — Encounter (HOSPITAL_COMMUNITY): Payer: Self-pay | Admitting: Internal Medicine

## 2022-02-01 ENCOUNTER — Inpatient Hospital Stay (HOSPITAL_COMMUNITY): Payer: Commercial Managed Care - HMO | Admitting: Anesthesiology

## 2022-02-01 ENCOUNTER — Encounter (HOSPITAL_COMMUNITY)
Admission: EM | Disposition: A | Discharge status: Home / Self Care | Payer: Self-pay | Source: Home / Self Care | Attending: Internal Medicine

## 2022-02-01 DIAGNOSIS — E875 Hyperkalemia: Secondary | ICD-10-CM

## 2022-02-01 DIAGNOSIS — K3189 Other diseases of stomach and duodenum: Secondary | ICD-10-CM

## 2022-02-01 DIAGNOSIS — D61818 Other pancytopenia: Secondary | ICD-10-CM

## 2022-02-01 DIAGNOSIS — I8510 Secondary esophageal varices without bleeding: Secondary | ICD-10-CM

## 2022-02-01 DIAGNOSIS — F1020 Alcohol dependence, uncomplicated: Secondary | ICD-10-CM

## 2022-02-01 DIAGNOSIS — G40909 Epilepsy, unspecified, not intractable, without status epilepticus: Secondary | ICD-10-CM

## 2022-02-01 DIAGNOSIS — K766 Portal hypertension: Secondary | ICD-10-CM

## 2022-02-01 DIAGNOSIS — K7460 Unspecified cirrhosis of liver: Secondary | ICD-10-CM

## 2022-02-01 DIAGNOSIS — K449 Diaphragmatic hernia without obstruction or gangrene: Secondary | ICD-10-CM

## 2022-02-01 DIAGNOSIS — K7031 Alcoholic cirrhosis of liver with ascites: Secondary | ICD-10-CM | POA: Diagnosis not present

## 2022-02-01 HISTORY — PX: ESOPHAGOGASTRODUODENOSCOPY: SHX5428

## 2022-02-01 LAB — COMPREHENSIVE METABOLIC PANEL
ALT: 21 U/L (ref 0–44)
AST: 74 U/L — ABNORMAL HIGH (ref 15–41)
Albumin: 2 g/dL — ABNORMAL LOW (ref 3.5–5.0)
Alkaline Phosphatase: 191 U/L — ABNORMAL HIGH (ref 38–126)
Anion gap: 6 (ref 5–15)
BUN: 7 mg/dL (ref 6–20)
CO2: 21 mmol/L — ABNORMAL LOW (ref 22–32)
Calcium: 8 mg/dL — ABNORMAL LOW (ref 8.9–10.3)
Chloride: 106 mmol/L (ref 98–111)
Creatinine, Ser: 0.6 mg/dL (ref 0.44–1.00)
GFR, Estimated: >60 mL/min (ref >60)
Glucose, Bld: 108 mg/dL — ABNORMAL HIGH (ref 70–99)
Potassium: 3.9 mmol/L (ref 3.5–5.1)
Sodium: 133 mmol/L — ABNORMAL LOW (ref 135–145)
Total Bilirubin: 8 mg/dL — ABNORMAL HIGH (ref 0.3–1.2)
Total Protein: 6.4 g/dL — ABNORMAL LOW (ref 6.5–8.1)

## 2022-02-01 LAB — CBC
HCT: 26.1 % — ABNORMAL LOW (ref 36.0–46.0)
Hemoglobin: 7.7 g/dL — ABNORMAL LOW (ref 12.0–15.0)
MCH: 23.8 pg — ABNORMAL LOW (ref 26.0–34.0)
MCHC: 29.5 g/dL — ABNORMAL LOW (ref 30.0–36.0)
MCV: 80.8 fL (ref 80.0–100.0)
Platelets: 51 10*3/uL — ABNORMAL LOW (ref 150–400)
RBC: 3.23 MIL/uL — ABNORMAL LOW (ref 3.87–5.11)
RDW: 30.3 % — ABNORMAL HIGH (ref 11.5–15.5)
WBC: 3 10*3/uL — ABNORMAL LOW (ref 4.0–10.5)
nRBC: 0 % (ref 0.0–0.2)

## 2022-02-01 LAB — MAGNESIUM: Magnesium: 1.7 mg/dL (ref 1.7–2.4)

## 2022-02-01 LAB — TYPE AND SCREEN
ABO/RH(D): O POS
Antibody Screen: NEGATIVE

## 2022-02-01 LAB — CYTOLOGY - NON PAP

## 2022-02-01 SURGERY — EGD (ESOPHAGOGASTRODUODENOSCOPY)
Anesthesia: General

## 2022-02-01 MED ORDER — PREDNISONE 20 MG PO TABS
20.0000 mg | ORAL_TABLET | Freq: Two times a day (BID) | ORAL | Status: DC
  Administered 2022-02-01 – 2022-02-02 (×3): 20 mg via ORAL
  Filled 2022-02-01 (×3): qty 1

## 2022-02-01 MED ORDER — PHENYLEPHRINE 80 MCG/ML (10ML) SYRINGE FOR IV PUSH (FOR BLOOD PRESSURE SUPPORT)
PREFILLED_SYRINGE | INTRAVENOUS | Status: DC | PRN
  Administered 2022-02-01 (×4): 80 ug via INTRAVENOUS

## 2022-02-01 MED ORDER — PROPOFOL 10 MG/ML IV BOLUS
INTRAVENOUS | Status: DC | PRN
  Administered 2022-02-01: 20 mg via INTRAVENOUS
  Administered 2022-02-01: 120 mg via INTRAVENOUS

## 2022-02-01 MED ORDER — SODIUM CHLORIDE 0.9 % IV SOLN: INTRAVENOUS | Status: DC

## 2022-02-01 MED ORDER — LACTATED RINGERS IV SOLN: INTRAVENOUS | Status: DC | PRN

## 2022-02-01 MED ORDER — FENTANYL CITRATE (PF) 100 MCG/2ML IJ SOLN
INTRAMUSCULAR | Status: DC | PRN
  Administered 2022-02-01: 25 ug via INTRAVENOUS

## 2022-02-01 MED ORDER — DEXAMETHASONE SODIUM PHOSPHATE 10 MG/ML IJ SOLN
INTRAMUSCULAR | Status: DC | PRN
  Administered 2022-02-01: 10 mg via INTRAVENOUS

## 2022-02-01 MED ORDER — FENTANYL CITRATE (PF) 100 MCG/2ML IJ SOLN
INTRAMUSCULAR | Status: AC
  Filled 2022-02-01: qty 2

## 2022-02-01 MED ORDER — ACETAMINOPHEN 325 MG PO TABS
650.0000 mg | ORAL_TABLET | Freq: Four times a day (QID) | ORAL | Status: DC | PRN
  Administered 2022-02-01: 650 mg via ORAL
  Filled 2022-02-01: qty 2

## 2022-02-01 MED ORDER — ONDANSETRON HCL 4 MG/2ML IJ SOLN
INTRAMUSCULAR | Status: DC | PRN
Start: 2022-02-01 — End: 2022-02-01
  Administered 2022-02-01: 4 mg via INTRAVENOUS

## 2022-02-01 MED ORDER — SUCCINYLCHOLINE CHLORIDE 200 MG/10ML IV SOSY
PREFILLED_SYRINGE | INTRAVENOUS | Status: DC | PRN
  Administered 2022-02-01: 100 mg via INTRAVENOUS

## 2022-02-01 MED ORDER — PROPOFOL 10 MG/ML IV BOLUS
INTRAVENOUS | Status: AC
  Filled 2022-02-01: qty 20

## 2022-02-01 MED ORDER — PROPOFOL 1000 MG/100ML IV EMUL
INTRAVENOUS | Status: AC
  Filled 2022-02-01: qty 100

## 2022-02-01 NOTE — Op Note (Signed)
[facility] Patient Name: Briana Sweeney Procedure Date: 02/01/2022 MRN: [id number] Attending MD: Clarene Essex , MD Date of Birth: 01-17-1971 CSN: [id number] Age: 51 Admit Type: Inpatient Procedure:                Upper GI endoscopy Indications:              Suspected upper gastrointestinal bleeding in                            patient with unexplained iron deficiency anemia,                            Esophageal varices, Follow-up of esophageal varices Providers:                Clarene Essex, MD, Dulcy Fanny, Gloris Ham, Technician Referring MD:              Medicines:                General Anesthesia Complications:            No immediate complications. Estimated Blood Loss:     Estimated blood loss: none. Procedure:                Pre-Anesthesia Assessment:                           - Prior to the procedure, a History and Physical                            was performed, and patient medications and                            allergies were reviewed. The patient's tolerance of                            previous anesthesia was also reviewed. The risks                            and benefits of the procedure and the sedation                            options and risks were discussed with the patient.                            All questions were answered, and informed consent                            was obtained. Prior Anticoagulants: The patient has                            taken no previous anticoagulant or antiplatelet                            agents. ASA Grade Assessment:  III - A patient with                            severe systemic disease. After reviewing the risks                            and benefits, the patient was deemed in                            satisfactory condition to undergo the procedure.                           After obtaining informed consent, the endoscope was                            passed  under direct vision. Throughout the                            procedure, the patient's blood pressure, pulse, and                            oxygen saturations were monitored continuously. The                            [id number]-H190 ([id number] Olympus endoscope was introduced                            through the mouth, and advanced to the third part                            of duodenum. The upper GI endoscopy was                            accomplished without difficulty. The patient                            tolerated the procedure well. Scope In: Scope Out: Findings:      A Tiny hiatal hernia was present.      Grade I varices were found in the middle third of the esophagus and in       the lower third of the esophagus. They were Minimal distal leg and       flattened out in the mid esophagus in largest diameter. There were no at       risk lesions and no therapy was done      A medium amount of food (residue) was found in the gastric fundus and in       the gastric body.      Moderate portal hypertensive gastropathy was found in the cardia and in       the gastric body.      The duodenal bulb, first portion of the duodenum, second portion of the       duodenum and third portion of the duodenum were normal.      The exam was otherwise without abnormality. Impression:               -  Tiny hiatal hernia.                           - Grade I esophageal varices.                           - A medium amount of food (residue) in the stomach.                           - Portal hypertensive gastropathy.                           - Normal duodenal bulb, first portion of the                            duodenum, second portion of the duodenum and third                            portion of the duodenum.                           - The examination was otherwise normal.                           - No specimens collected. Moderate Sedation:      Not Applicable - Patient had care per  Anesthesia. Recommendation:           - Soft diet today with low sodium restriction.                           - Continue present medications. We will try 40 mg                            of prednisone for 1 month in case some of her liver                            disease is alcoholic hepatitis based on her [place]                            discriminatory function                           - Return to GI clinic in 1 month. I still think she                            would be best served with inpatient alcohol rehab                           - Telephone GI clinic if symptomatic PRN. We will                            check on tomorrow Procedure Code(s):        --- Professional ---  30076, Esophagogastroduodenoscopy, flexible,                            transoral; diagnostic, including collection of                            specimen(s) by brushing or washing, when performed                            (separate procedure) Diagnosis Code(s):        --- Professional ---                           K44.9, Diaphragmatic hernia without obstruction or                            gangrene                           I85.00, Esophageal varices without bleeding                           K76.6, Portal hypertension                           K31.89, Other diseases of stomach and duodenum                           D50.9, Iron deficiency anemia, unspecified CPT copyright 2019 [employer]. All rights reserved. The codes documented in this report are preliminary and upon coder review may  be revised to meet current compliance requirements. Clarene Essex, MD 02/01/2022 9:41:43 AM This report has been signed electronically. Number of Addenda: 0

## 2022-02-01 NOTE — Anesthesia Procedure Notes (Signed)
Procedure Name: Intubation Date/Time: 02/01/2022 9:08 AM Performed by: Renato Shin, CRNA Pre-anesthesia Checklist: Patient identified, Emergency Drugs available, Suction available and Patient being monitored Patient Re-evaluated:Patient Re-evaluated prior to induction Oxygen Delivery Method: Circle system utilized Preoxygenation: Pre-oxygenation with 100% oxygen Induction Type: IV induction Ventilation: Mask ventilation without difficulty Laryngoscope Size: Miller and 3 Grade View: Grade I Tube type: Oral Tube size: 7.0 mm Number of attempts: 1 Airway Equipment and Method: Stylet and Oral airway Placement Confirmation: ETT inserted through vocal cords under direct vision, positive ETCO2 and breath sounds checked- equal and bilateral Secured at: 21 cm Tube secured with: Tape Dental Injury: Teeth and Oropharynx as per pre-operative assessment

## 2022-02-01 NOTE — Progress Notes (Signed)
Briana Sweeney 8:37 AM  Subjective: Patient feeling better after paracentesis no new complaints no signs of bleeding and we discussed her endoscopy and varices  Objective: Vital signs stable afebrile exam please see preassessment evaluation ascites fluid transudate no signs of infection labs stable platelets 51 hemoglobin white count stable  Assessment: Cirrhosis secondary to alcohol  Plan: Okay to proceed with endoscopy with anesthesia assistance and probable banding  Wasim Hurlbut E  office [phone] After 5PM or if no answer call [phone]

## 2022-02-01 NOTE — Progress Notes (Signed)
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  Patient was recently admitted about a week ago for abdominal distention as well requiring paracentesis.  He started developing abdominal distention again 3 days prior to admission.  Patient continues to drink.  CT showed severe ascites again.  ED spoke with [facility] who recommended admitting the patient for paracentesis.   Assessment & Plan:   Principal Problem:   Alcoholic cirrhosis of liver with ascites and HCV infection Active Problems:   Pancytopenia (HCC)   Alcohol use disorder, severe, dependence (HCC)   Seizure disorder (HCC)   Substance induced mood disorder (HCC)   Esophageal 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 (HCC)   Hyperkalemia   Ascites   Alcoholic cirrhosis of liver with ascites and HCV infection, decompensated Pancytopenia, esophageal varices -Patient continues to drink alcohol.  Recently had paracentesis, readmitted for recurrent ascites -GI consult, s/p EGD grade 1 varices. -Ultrasound-guided paracentesis ordered along with fluid studies-will f/up - Continue home Lasix.  We will also c/w Aldactone given hypercalcemia has resolved.  . -Continue to monitor platelets. -Continue nadolol 20 mg daily.  Does have history of variceal bleeding and was admitted at [facility] in February 2023 resulting in hemorrhagic shock. -Lactulose 3 times daily. -PPI daily   Alcohol use disorder, severe, dependence ([place]) -Unfortunately patient continues to drink alcohol daily.  Currently on alcohol withdrawal protocol.  On clonazepam twice daily.   Hyperkalemia Resolved.  Continue Lasix, resume home Aldactone   Substance induced mood disorder  (HCC) Continue home Abilify, Prozac, clonazepam.   Seizure disorder ([place]) Continue home Keppra.     DVT prophylaxis: SCDs Start: 01/30/22 2244 Code Status: Full code     Code Status Orders  (From admission, onward)           Start     Ordered   01/30/22 2244  Full code  Continuous        01/30/22 2244           Code Status History     Date Active Date Inactive Code Status Order ID Comments User Context   01/23/2022 0002 01/24/2022 1657 Full Code [id number]  Toy Baker, MD Inpatient   12/15/2021 1033 12/16/2021 2209 Full Code [id number]  Reubin Milan, MD ED   11/01/2021 0030 11/02/2021 1614 Full Code [id number]  Vianne Bulls, MD Inpatient   06/25/2021 1819 06/29/2021 2215 Full Code [id number]  Alcus Dad, MD ED   04/04/2021 1242 04/08/2021 1917 Full Code [id number]  Collier Bullock, MD ED   12/15/2019 1905 12/19/2019 2219 Full Code [id number]  Mendel Corning, MD ED   07/04/2019 0025 07/15/2019 1530 Full Code [id number]  Elwyn Reach, MD ED   04/02/2019 2111 04/13/2019 1523 Full Code [id number]  Jani Gravel, MD Inpatient   01/20/2019 2338 01/26/2019 2027 Full Code [id number]  Elwyn Reach, MD Inpatient   12/05/2018 1749 12/14/2018 1628 Full Code [id number]  Velna Ochs, MD Inpatient   11/20/2018 1544 11/24/2018 2157 Full Code [id number]  Kathi Ludwig, MD ED   09/13/2018 1900 09/22/2018 1745 Full Code [id number]  Colbert Ewing, MD ED   08/25/2018 1829 08/29/2018 1610 Full Code [id number]  Consuello Closs, NP Inpatient   08/22/2018 2019 08/25/2018 1804 Full Code [id number]  Ivor Costa, MD  ED   02/10/2018 2022 02/14/2018 1737 Full Code [id number]  Harvie Bridge, DO ED   01/24/2018 2333 01/25/2018 0151 Full Code [id number]  Jacqlyn Larsen, PA-C ED   01/13/2018 1808 01/17/2018 1559 Full Code [id number]  Doreatha Lew, MD ED   01/13/2018 1157 01/13/2018 1808 Full Code [id number]  Daleen Bo, MD ED   01/02/2018 2130 01/08/2018 2022 Full Code [id number]  Rise Patience, MD ED   01/02/2018 2057 01/02/2018 2102 Full Code [id number]  Street, [place], [place] ED   10/28/2017 1552 11/02/2017 1840 Full Code [id number]  Vicenta Aly, NP Inpatient   10/09/2017 0038 10/14/2017 1745 Full Code [id number]  Vianne Bulls, MD ED   09/11/2017 2234 09/16/2017 1711 Full Code [id number]  Karmen Bongo, MD Inpatient   09/01/2017 0159 09/04/2017 1436 Full Code [id number]  Vilma Prader, MD Inpatient   07/19/2017 2125 07/28/2017 2031 Full Code [id number]  Vilma Prader, MD ED      Family Communication: discussed with patient  Disposition Plan: Not stable for discharge, will follow-up culture results, advance diet.  Consults called: None Admission status: Inpatient   Consultants:  GI  Procedures:  CT ABDOMEN PELVIS WO CONTRAST  Result Date: 01/30/2022 CLINICAL DATA:  Acute nonlocalized abdominal pain. Fell yesterday. Bilateral lower extremity edema. History of paracentesis, pancreatic cancer, cirrhosis. EXAM: CT ABDOMEN AND PELVIS WITHOUT CONTRAST TECHNIQUE: Multidetector CT imaging of the abdomen and pelvis was performed following the standard protocol without IV contrast. RADIATION DOSE REDUCTION: This exam was performed according to the departmental dose-optimization program which includes automated exposure control, adjustment of the mA and/or kV according to patient size and/or use of iterative reconstruction technique. COMPARISON:  12/07/2021 FINDINGS: Lower chest: Mild dependent atelectasis in the lung bases. Calcified granuloma on the left with calcified left hilar lymph nodes. Small esophageal hiatal hernia. Multiple prominent paraesophageal varices are better demonstrated on the previous contrast-enhanced study. Hepatobiliary: Diffuse fatty infiltration of the liver as seen previously. Nodular contour with enlarged lateral segment left lobe consistent with cirrhosis. Cholelithiasis with a large stone in the gallbladder. No discrete inflammatory stranding. Mild  gallbladder wall thickening is likely due to liver disease. Pancreas: Calcifications in the head of the pancreas likely related to chronic pancreatitis. No pancreatic ductal dilatation or focal lesion identified on noncontrast imaging. Spleen: Spleen is enlarged.  Calcified granulomas. Adrenals/Urinary Tract: No adrenal gland nodules. Kidneys are symmetrical. No hydronephrosis or hydroureter. Bladder is unremarkable. Stomach/Bowel: Stomach, small bowel, and colon are not abnormally distended. Stool fills the colon. Mild right colonic wall thickening may be due to portal venous colopathy. Appendix is normal. Vascular/Lymphatic: Scattered abdominal and retroperitoneal lymph nodes are likely related to liver disease. Reproductive: Uterus and bilateral adnexa are unremarkable. Other: Large amount of diffuse abdominal and pelvic ascites. No free air. Abdominal wall musculature appears intact. Musculoskeletal: No acute or significant osseous findings. IMPRESSION: 1. Changes of hepatic cirrhosis and fatty liver with splenomegaly, prominent upper abdominal and paraesophageal varices, and severe abdominal and pelvic ascites. 2. Small esophageal hiatal hernia. 3. Calcifications in the head of the pancreas likely relate to chronic pancreatitis. 4. Mild colonic wall thickening may be due to portal hypertensive colopathy. 5. Cholelithiasis. Gallbladder wall thickening is likely due to liver disease. Electronically Signed   By: Lucienne Capers M.D.   On: 01/30/2022 20:19   US Paracentesis  Result Date: 01/31/2022 INDICATION: Patient with history of alcoholic cirrhosis, recurrent ascites, esophageal varices. Request received for diagnostic and therapeutic paracentesis up to 5 liters.  EXAM: ULTRASOUND GUIDED DIAGNOSTIC AND THERAPEUTIC PARACENTESIS MEDICATIONS: 8 mL 1% lidocaine COMPLICATIONS: None immediate. PROCEDURE: Informed written consent was obtained from the patient after a discussion of the risks, benefits and  alternatives to treatment. A timeout was performed prior to the initiation of the procedure. Initial ultrasound scanning demonstrates a moderate to large amount of ascites within the left lower abdominal quadrant. The left lower abdomen was prepped and draped in the usual sterile fashion. 1% lidocaine was used for local anesthesia. Following this, a 19 gauge, 10-cm, Yueh catheter was introduced. An ultrasound image was saved for documentation purposes. The paracentesis was performed. The catheter was removed and a dressing was applied. The patient tolerated the procedure well without immediate post procedural complication. FINDINGS: A total of approximately 3.6 liters of yellow fluid was removed. Samples were sent to the laboratory as requested by the clinical team. IMPRESSION: Successful ultrasound-guided paracentesis yielding 3.6 liters of peritoneal fluid. Read by: Rowe Robert, PA-C PLAN: If the patient eventually requires >/=2 paracenteses in a 30 day period, candidacy for formal evaluation by the [facility] will be assessed. Michaelle Birks, MD [facility] Electronically Signed   By: Michaelle Birks M.D.   On: 01/31/2022 16:05   US Paracentesis  Result Date: 01/23/2022 INDICATION: History of cirrhosis with recurrent ascites. Request for therapeutic and diagnostic paracentesis. EXAM: ULTRASOUND GUIDED DIAGNOSTIC AND THERAPEUTIC RIGHT LOWER QUADRANT PARACENTESIS MEDICATIONS: 10 mL 1 % lidocaine COMPLICATIONS: None immediate. PROCEDURE: Informed written consent was obtained from the patient after a discussion of the risks, benefits and alternatives to treatment. A timeout was performed prior to the initiation of the procedure. Initial ultrasound scanning demonstrates a large amount of ascites within the right lower abdominal quadrant. The right lower abdomen was prepped and draped in the usual sterile  fashion. 1% lidocaine was used for local anesthesia. Following this, a 19 gauge, 10-cm, Yueh catheter was introduced. An ultrasound image was saved for documentation purposes. The paracentesis was performed. The catheter was removed and a dressing was applied. The patient tolerated the procedure well without immediate post procedural complication. FINDINGS: A total of approximately 4 L of cloudy, yellow fluid was removed. Samples were sent to the laboratory as requested by the clinical team. IMPRESSION: Successful ultrasound-guided paracentesis yielding 4 liters of peritoneal fluid. Read by: Narda Rutherford, AGNP-BC Electronically Signed   By: Ruthann Cancer M.D.   On: 01/23/2022 07:55   DG Chest Port 1 View  Result Date: 01/30/2022 CLINICAL DATA:  Shortness of breath. Abdominal distention. Bilateral lower extremity edema. EXAM: PORTABLE CHEST 1 VIEW COMPARISON:  01/22/2022 FINDINGS: Heart size and pulmonary vascularity are normal. Shallow inspiration with elevation of the right hemidiaphragm. No edema or consolidation in the lungs. Calcified lymph nodes in the left hilum. No pleural effusions. No pneumothorax. Mediastinal contours appear intact. Degenerative changes in the shoulders. Postoperative change in the right shoulder. IMPRESSION: No active disease. Electronically Signed   By: Lucienne Capers M.D.   On: 01/30/2022 19:27   DG Chest Portable 1 View  Result Date: 01/22/2022 CLINICAL DATA:  Short of breath, history of pancreatic cancer and cirrhosis EXAM: PORTABLE CHEST 1 VIEW COMPARISON:  01/12/2022 FINDINGS: Single frontal view of the chest demonstrates a stable cardiac silhouette. Stable calcified left hilar lymph nodes. No airspace disease, effusion, or pneumothorax. No acute bony abnormalities. IMPRESSION: 1. No acute intrathoracic process. Electronically Signed   By: Randa Ngo M.D.   On: 01/22/2022 17:59  [place] ASCITES (ABDOMEN LIMITED)  Result Date: 01/23/2022 CLINICAL DATA:  Ascites. EXAM:  LIMITED ABDOMEN ULTRASOUND FOR ASCITES TECHNIQUE: Limited ultrasound survey for ascites was performed in all four abdominal quadrants. COMPARISON:  Jan 22, 2022. FINDINGS: Minimal ascites is noted in all 4 quadrants of the abdomen. No adequate pocket for paracentesis is noted. IMPRESSION: Minimal ascites is noted. No adequate pocket for paracentesis is noted. The patient did undergo paracentesis yesterday. Electronically Signed   By: Marijo Conception M.D.   On: 01/23/2022 11:52       Subjective: S/p EGD today  tolerated without complication  Objective: Vitals:   02/01/22 0946 02/01/22 0956 02/01/22 1006 02/01/22 1046  BP: 109/77 111/75 108/80 (!) 122/96  Pulse: 99 96 100 99  Resp: '20 17 16 16  '$ Temp:    98.5 F (36.9 C)  TempSrc:    Oral  SpO2: 94% 93% 94% 96%  Weight:      Height:        Intake/Output Summary (Last 24 hours) at 02/01/2022 1426 Last data filed at 02/01/2022 1337 Gross per 24 hour  Intake 920.66 ml  Output --  Net 920.66 ml   Filed Weights   01/31/22 0046 01/31/22 0500 02/01/22 0422  Weight: 66.6 kg 66.6 kg 66.3 kg    Examination:  General exam: Appears calm and comfortable  Respiratory system: Clear to auscultation. Respiratory effort normal. Cardiovascular system: S1 & S2 heard, RRR. No JVD, murmurs, rubs, gallops or clicks. No pedal edema. Gastrointestinal system: Abdomen is mild distended, soft, distant bowel sounds heard. Central nervous system: Alert and oriented. No focal neurological deficits. Extremities: Symmetric 5 x 5 power. Skin: No rashes, lesions or ulcers Psychiatry: Judgement and insight appear normal. Mood & affect appropriate.     Data Reviewed: I have personally reviewed following labs and imaging studies  CBC: Recent Labs  Lab 01/28/22 0000 01/30/22 1934 01/31/22 0401 02/01/22 0442  WBC 3.7 3.0* 2.6* 3.0*  NEUTROABS 2.11 1.6*  --   --   HGB 8.6* 8.1* 7.9* 7.7*  HCT 29* 27.1* 25.8* 26.1*  MCV  --  81.4 79.4* 80.8  PLT 44*  61* 50* 51*   Basic Metabolic Panel: Recent Labs  Lab 01/30/22 1934 01/31/22 0401 02/01/22 0442  NA 133* 136 133*  K 5.2* 3.6 3.9  CL 107 109 106  CO2 21* 22 21*  GLUCOSE 118* 103* 108*  BUN '7 7 7  '$ CREATININE 0.44 0.62 0.60  CALCIUM 7.6* 8.1* 8.0*  MG  --   --  1.7   GFR: Estimated Creatinine Clearance: 70.4 mL/min (by C-G formula based on SCr of 0.6 mg/dL). Liver Function Tests: Recent Labs  Lab 01/30/22 1934 01/31/22 0401 02/01/22 0442  AST 120* 80* 74*  ALT '26 20 21  '$ ALKPHOS 222* 216* 191*  BILITOT 5.6* 5.7* 8.0*  PROT 6.2* 6.6 6.4*  ALBUMIN 2.1* 2.0* 2.0*   No results for input(s): LIPASE, AMYLASE in the last 168 hours. No results for input(s): AMMONIA in the last 168 hours. Coagulation Profile: Recent Labs  Lab 01/30/22 1934 01/31/22 0401  INR 1.8* 1.8*   Cardiac Enzymes: No results for input(s): CKTOTAL, CKMB, CKMBINDEX, TROPONINI in the last 168 hours. BNP (last 3 results) No results for input(s): PROBNP in the last 8760 hours. HbA1C: No results for input(s): HGBA1C in the last 72 hours. CBG: No results for input(s): GLUCAP in the last 168 hours. Lipid Profile: No results for input(s): CHOL, HDL, LDLCALC, TRIG, CHOLHDL, LDLDIRECT in the  last 72 hours. Thyroid Function Tests: No results for input(s): TSH, T4TOTAL, FREET4, T3FREE, THYROIDAB in the last 72 hours. Anemia Panel: No results for input(s): VITAMINB12, FOLATE, FERRITIN, TIBC, IRON, RETICCTPCT in the last 72 hours. Sepsis Labs: No results for input(s): PROCALCITON, LATICACIDVEN in the last 168 hours.  Recent Results (from the past 240 hour(s))  Culture, body fluid w Gram Stain-bottle     Status: None   Collection Time: 01/22/22  5:08 PM   Specimen: Fluid  Result Value Ref Range Status   Specimen Description FLUID PERITONEAL  Final   Special Requests BOTTLES DRAWN AEROBIC AND ANAEROBIC  Final   Culture   Final    NO GROWTH 5 DAYS Performed at [facility], [place] [address],  [place], [place] [address]    Report Status 01/29/2022 FINAL  Final  Gram stain     Status: None   Collection Time: 01/22/22  5:08 PM   Specimen: Fluid  Result Value Ref Range Status   Specimen Description FLUID PERITONEAL  Final   Special Requests NONE  Final   Gram Stain   Final    RARE WBC PRESENT, PREDOMINANTLY MONONUCLEAR NO ORGANISMS SEEN Performed at [facility], [place] [address], [place], [place] [address]    Report Status 01/24/2022 FINAL  Final  Culture, body fluid w Gram Stain-bottle     Status: None (Preliminary result)   Collection Time: 01/31/22  8:36 PM   Specimen: Peritoneal Washings  Result Value Ref Range Status   Specimen Description PERITONEAL  Final   Special Requests NONE  Final   Culture   Final    NO GROWTH < 12 HOURS Performed at [facility], [place] [address], [place], [place] [address]    Report Status PENDING  Incomplete  Gram stain     Status: None   Collection Time: 01/31/22  8:36 PM   Specimen: Peritoneal Washings  Result Value Ref Range Status   Specimen Description PERITONEAL  Final   Special Requests NONE  Final   Gram Stain   Final    NO WBC SEEN NO ORGANISMS SEEN Performed at [facility], [address], [place],  [address]    Report Status 01/31/2022 FINAL  Final         Radiology Studies: CT ABDOMEN PELVIS WO CONTRAST  Result Date: 01/30/2022 CLINICAL DATA:  Acute nonlocalized abdominal pain. Fell yesterday. Bilateral lower extremity edema. History of paracentesis, pancreatic cancer, cirrhosis. EXAM: CT ABDOMEN AND PELVIS WITHOUT CONTRAST TECHNIQUE: Multidetector CT imaging of the abdomen and pelvis was performed following the standard protocol without IV contrast. RADIATION DOSE REDUCTION: This exam was performed according to the departmental dose-optimization program which includes automated exposure control, adjustment of the mA and/or kV according to patient size and/or use of iterative reconstruction technique.  COMPARISON:  12/07/2021 FINDINGS: Lower chest: Mild dependent atelectasis in the lung bases. Calcified granuloma on the left with calcified left hilar lymph nodes. Small esophageal hiatal hernia. Multiple prominent paraesophageal varices are better demonstrated on the previous contrast-enhanced study. Hepatobiliary: Diffuse fatty infiltration of the liver as seen previously. Nodular contour with enlarged lateral segment left lobe consistent with cirrhosis. Cholelithiasis with a large stone in the gallbladder. No discrete inflammatory stranding. Mild gallbladder wall thickening is likely due to liver disease. Pancreas: Calcifications in the head of the pancreas likely related to chronic pancreatitis. No pancreatic ductal dilatation or focal lesion identified on noncontrast imaging. Spleen: Spleen is enlarged.  Calcified  granulomas. Adrenals/Urinary Tract: No adrenal gland nodules. Kidneys are symmetrical. No hydronephrosis or hydroureter. Bladder is unremarkable. Stomach/Bowel: Stomach, small bowel, and colon are not abnormally distended. Stool fills the colon. Mild right colonic wall thickening may be due to portal venous colopathy. Appendix is normal. Vascular/Lymphatic: Scattered abdominal and retroperitoneal lymph nodes are likely related to liver disease. Reproductive: Uterus and bilateral adnexa are unremarkable. Other: Large amount of diffuse abdominal and pelvic ascites. No free air. Abdominal wall musculature appears intact. Musculoskeletal: No acute or significant osseous findings. IMPRESSION: 1. Changes of hepatic cirrhosis and fatty liver with splenomegaly, prominent upper abdominal and paraesophageal varices, and severe abdominal and pelvic ascites. 2. Small esophageal hiatal hernia. 3. Calcifications in the head of the pancreas likely relate to chronic pancreatitis. 4. Mild colonic wall thickening may be due to portal hypertensive colopathy. 5. Cholelithiasis. Gallbladder wall thickening is likely due  to liver disease. Electronically Signed   By: Lucienne Capers M.D.   On: 01/30/2022 20:19   US Paracentesis  Result Date: 01/31/2022 INDICATION: Patient with history of alcoholic cirrhosis, recurrent ascites, esophageal varices. Request received for diagnostic and therapeutic paracentesis up to 5 liters. EXAM: ULTRASOUND GUIDED DIAGNOSTIC AND THERAPEUTIC PARACENTESIS MEDICATIONS: 8 mL 1% lidocaine COMPLICATIONS: None immediate. PROCEDURE: Informed written consent was obtained from the patient after a discussion of the risks, benefits and alternatives to treatment. A timeout was performed prior to the initiation of the procedure. Initial ultrasound scanning demonstrates a moderate to large amount of ascites within the left lower abdominal quadrant. The left lower abdomen was prepped and draped in the usual sterile fashion. 1% lidocaine was used for local anesthesia. Following this, a 19 gauge, 10-cm, Yueh catheter was introduced. An ultrasound image was saved for documentation purposes. The paracentesis was performed. The catheter was removed and a dressing was applied. The patient tolerated the procedure well without immediate post procedural complication. FINDINGS: A total of approximately 3.6 liters of yellow fluid was removed. Samples were sent to the laboratory as requested by the clinical team. IMPRESSION: Successful ultrasound-guided paracentesis yielding 3.6 liters of peritoneal fluid. Read by: Rowe Robert, PA-C PLAN: If the patient eventually requires >/=2 paracenteses in a 30 day period, candidacy for formal evaluation by the [facility] will be assessed. Michaelle Birks, MD [facility] Electronically Signed   By: Michaelle Birks M.D.   On: 01/31/2022 16:05   DG Chest Port 1 View  Result Date: 01/30/2022 CLINICAL DATA:  Shortness of breath. Abdominal distention. Bilateral lower extremity edema.  EXAM: PORTABLE CHEST 1 VIEW COMPARISON:  01/22/2022 FINDINGS: Heart size and pulmonary vascularity are normal. Shallow inspiration with elevation of the right hemidiaphragm. No edema or consolidation in the lungs. Calcified lymph nodes in the left hilum. No pleural effusions. No pneumothorax. Mediastinal contours appear intact. Degenerative changes in the shoulders. Postoperative change in the right shoulder. IMPRESSION: No active disease. Electronically Signed   By: Lucienne Capers M.D.   On: 01/30/2022 19:27        Scheduled Meds:  ARIPiprazole  10 mg Oral q morning   clonazePAM  0.5 mg Oral BID   FLUoxetine  40 mg Oral q morning   folic acid  1 mg Oral Daily   furosemide  60 mg Oral Daily   lactulose  20 g Oral TID   levETIRAcetam  750 mg Oral BID   multivitamin with minerals  1 tablet Oral Daily   nadolol  20 mg Oral q  morning   pantoprazole  40 mg Oral q morning   predniSONE  20 mg Oral BID   sodium chloride flush  3 mL Intravenous Q12H   spironolactone  150 mg Oral Daily   thiamine  100 mg Oral Daily   Or   thiamine  100 mg Intravenous Daily   Continuous Infusions:   LOS: 1 day    Time spent: 35 min    Nicolette Bang, MD [facility]  If 7PM-7AM, please contact night-coverage  02/01/2022, 2:26 PM

## 2022-02-01 NOTE — Anesthesia Preprocedure Evaluation (Addendum)
Anesthesia Evaluation  Patient identified by MRN, date of birth, ID band Patient awake    Reviewed: Allergy & Precautions, NPO status , Patient's Chart, lab work & pertinent test results, reviewed documented beta blocker date and time   History of Anesthesia Complications Negative for: history of anesthetic complications  Airway Mallampati: II  TM Distance: >3 FB Neck ROM: Full    Dental  (+) Dental Advisory Given, Poor Dentition, Missing   Pulmonary neg pulmonary ROS,    breath sounds clear to auscultation       Cardiovascular hypertension, Pt. on medications and Pt. on home beta blockers (-) angina Rhythm:Regular Rate:Normal  '22 ECHO: EF 60-65%, normal LVF, no significant valvular abnormalities   Neuro/Psych  Headaches, Seizures -,  Anxiety Depression Bipolar Disorder Schizophrenia    GI/Hepatic GERD  Medicated,(+) Cirrhosis   Esophageal Varices and ascites  substance abuse  alcohol use and cocaine use, Elevated LFTs   Endo/Other    Renal/GU Renal InsufficiencyRenal disease     Musculoskeletal   Abdominal   Peds  Hematology  (+) Blood dyscrasia, anemia , Hb 7.7, plt 51k, INR 1.8   Anesthesia Other Findings   Reproductive/Obstetrics                            Anesthesia Physical Anesthesia Plan  ASA: 4  Anesthesia Plan: General   Post-op Pain Management: Minimal or no pain anticipated   Induction: Intravenous and Rapid sequence  PONV Risk Score and Plan: 3 and Ondansetron, Dexamethasone and Treatment may vary due to age or medical condition  Airway Management Planned: Oral ETT  Additional Equipment: None  Intra-op Plan:   Post-operative Plan: Extubation in OR  Informed Consent: I have reviewed the patients History and Physical, chart, labs and discussed the procedure including the risks, benefits and alternatives for the proposed anesthesia with the patient or authorized  representative who has indicated his/her understanding and acceptance.     Dental advisory given  Plan Discussed with: CRNA and Surgeon  Anesthesia Plan Comments:        Anesthesia Quick Evaluation

## 2022-02-01 NOTE — Transfer of Care (Signed)
Immediate Anesthesia Transfer of Care Note  Patient: Briana Sweeney  Procedure(s) Performed: ESOPHAGOGASTRODUODENOSCOPY (EGD)  Patient Location: PACU  Anesthesia Type:General  Level of Consciousness: awake and patient cooperative  Airway & Oxygen Therapy: Patient Spontanous Breathing  Post-op Assessment: Report given to RN and Post -op Vital signs reviewed and stable  Post vital signs: Reviewed and stable  Last Vitals:  Vitals Value Taken Time  BP 118/82 02/01/22 0936  Temp 35.9 C 02/01/22 0936  Pulse 98 02/01/22 0939  Resp 20 02/01/22 0939  SpO2 96 % 02/01/22 0939  Vitals shown include unvalidated device data.  Last Pain:  Vitals:   02/01/22 0936  TempSrc: Temporal  PainSc: 0-No pain         Complications: No notable events documented.

## 2022-02-01 NOTE — Anesthesia Postprocedure Evaluation (Signed)
Anesthesia Post Note  Patient: Briana Sweeney  Procedure(s) Performed: ESOPHAGOGASTRODUODENOSCOPY (EGD)     Patient location during evaluation: PACU Anesthesia Type: General Level of consciousness: awake and alert, patient cooperative and oriented Pain management: pain level controlled Vital Signs Assessment: post-procedure vital signs reviewed and stable Respiratory status: spontaneous breathing, nonlabored ventilation and respiratory function stable Cardiovascular status: blood pressure returned to baseline and stable Postop Assessment: no apparent nausea or vomiting Anesthetic complications: no   No notable events documented.  Last Vitals:  Vitals:   02/01/22 0832 02/01/22 0936  BP: 126/86 118/82  Pulse: 99 100  Resp: 20 (!) 21  Temp: 36.6 C (!) 35.9 C  SpO2: 96% 97%    Last Pain:  Vitals:   02/01/22 0936  TempSrc: Temporal  PainSc: 0-No pain                 Briana Sweeney,E. Kebra [place]rimore

## 2022-02-02 DIAGNOSIS — K7460 Unspecified cirrhosis of liver: Secondary | ICD-10-CM | POA: Diagnosis not present

## 2022-02-02 DIAGNOSIS — F1994 Other psychoactive substance use, unspecified with psychoactive substance-induced mood disorder: Secondary | ICD-10-CM

## 2022-02-02 DIAGNOSIS — K7031 Alcoholic cirrhosis of liver with ascites: Secondary | ICD-10-CM | POA: Diagnosis not present

## 2022-02-02 DIAGNOSIS — F1020 Alcohol dependence, uncomplicated: Secondary | ICD-10-CM | POA: Diagnosis not present

## 2022-02-02 DIAGNOSIS — D61818 Other pancytopenia: Secondary | ICD-10-CM | POA: Diagnosis not present

## 2022-02-02 LAB — COMPREHENSIVE METABOLIC PANEL
ALT: 21 U/L (ref 0–44)
AST: 61 U/L — ABNORMAL HIGH (ref 15–41)
Albumin: 2 g/dL — ABNORMAL LOW (ref 3.5–5.0)
Alkaline Phosphatase: 176 U/L — ABNORMAL HIGH (ref 38–126)
Anion gap: 6 (ref 5–15)
BUN: 7 mg/dL (ref 6–20)
CO2: 20 mmol/L — ABNORMAL LOW (ref 22–32)
Calcium: 8.2 mg/dL — ABNORMAL LOW (ref 8.9–10.3)
Chloride: 103 mmol/L (ref 98–111)
Creatinine, Ser: 0.51 mg/dL (ref 0.44–1.00)
GFR, Estimated: >60 mL/min (ref >60)
Glucose, Bld: 158 mg/dL — ABNORMAL HIGH (ref 70–99)
Potassium: 3.9 mmol/L (ref 3.5–5.1)
Sodium: 129 mmol/L — ABNORMAL LOW (ref 135–145)
Total Bilirubin: 7.3 mg/dL — ABNORMAL HIGH (ref 0.3–1.2)
Total Protein: 6.5 g/dL (ref 6.5–8.1)

## 2022-02-02 LAB — MAGNESIUM: Magnesium: 1.8 mg/dL (ref 1.7–2.4)

## 2022-02-02 MED ORDER — THIAMINE HCL 100 MG PO TABS: 100.0000 mg | ORAL_TABLET | Freq: Every day | ORAL | 0 refills | Status: DC

## 2022-02-02 MED ORDER — FUROSEMIDE 20 MG PO TABS: 60.0000 mg | ORAL_TABLET | Freq: Every day | ORAL | 0 refills | Status: DC

## 2022-02-02 MED ORDER — SPIRONOLACTONE 50 MG PO TABS: 150.0000 mg | ORAL_TABLET | Freq: Every day | ORAL | 0 refills | Status: DC

## 2022-02-02 MED ORDER — NADOLOL 20 MG PO TABS: 20.0000 mg | ORAL_TABLET | Freq: Every morning | ORAL | 0 refills | Status: DC

## 2022-02-02 MED ORDER — PREDNISONE 20 MG PO TABS: 20.0000 mg | ORAL_TABLET | Freq: Two times a day (BID) | ORAL | 0 refills | Status: DC

## 2022-02-02 MED ORDER — FOLIC ACID 1 MG PO TABS
1.0000 mg | ORAL_TABLET | Freq: Every day | ORAL | 0 refills | Status: AC
Start: 2022-02-03 — End: 2022-03-05

## 2022-02-02 NOTE — Progress Notes (Signed)
Briana Sweeney 12:06 PM  Subjective: Patient doing much better no new complaints wanting to go home no problems from her endoscopy yesterday  Objective: Vital signs stable afebrile no acute distress exam unchanged bili slight decrease  Assessment: Alcoholic cirrhosis  Plan: Okay with me to go home follow-up in our office in 2 to 3 weeks please write a prescription for her Aldactone and other medicine she does not have at home and again we discussed alcohol cessation and will wean her prednisone as an outpatient as well  [facility] E  office [phone] After 5PM or if no answer call [phone]

## 2022-02-02 NOTE — TOC Transition Note (Signed)
Transition of Care [facility]) - CM/SW Discharge Note   Patient Details  Name: Briana Sweeney MRN: [id number] Date of Birth: 02/25/1971  [facility] CM/SW Contact:  Ross Ludwig, LCSW Phone Number: 02/02/2022, 12:30 PM   Clinical Narrative:     Patient was provided substance abuse resources last week during the previous hospitalization.  CSW signing off, please reconsult if any other social work needs arise.   Final next level of care: Home/Self Care Barriers to Discharge: Barriers Resolved   Patient Goals and CMS Choice Patient states their goals for this hospitalization and ongoing recovery are:: To return back home CMS Medicare.gov Compare Post Acute Care list provided to:: Patient Choice offered to / list presented to : Patient  Discharge Placement                       Discharge Plan and Services   Discharge Planning Services: CM Consult                                 Social Determinants of Health (SDOH) Interventions     Readmission Risk Interventions    02/02/2022   12:29 PM 07/06/2019    3:09 PM  Readmission Risk Prevention Plan  Transportation Screening Complete Complete  Medication Review Press photographer) Complete Complete  PCP or Specialist appointment within 3-5 days of discharge Complete Complete  HRI or [place] Complete Complete  SW Recovery Care/Counseling Consult Complete Complete  Palliative Care Screening Complete Not [place] Not Applicable Not Applicable

## 2022-02-02 NOTE — Discharge Summary (Signed)
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  Patient was recently admitted about a week ago for abdominal distention as well requiring paracentesis.  He started developing abdominal distention again 3 days prior to admission.  Patient continues to drink.  CT showed severe ascites again.  ED spoke with [facility] who recommended admitting the patient for paracentesis  Patient underwent paracentesis without complication cultures had no organisms no white blood cells.  Patient also had on EGD with no medical therapy implemented. GI recommended prednisone 40 mg daily x1 month with close follow-up with GI.  I had a discussion with the patient on the importance of not stopping the prednisone without physician guidance.  I explained there could lead to significant drops in blood pressure and risk of acute events including death.  Patient expressed understanding.   Patient reports she feels at her baseline and requested discharge.  I agree with GI the patient would benefit from inpatient alcohol cessation but at this point was not on board with that idea.  She will follow-up with GI and continue her home medications for chronic medical problems.   Discharge Diagnoses:  Principal Problem:   Alcoholic cirrhosis of liver with ascites and HCV  infection Active Problems:   Pancytopenia (HCC)   Alcohol use disorder, severe, dependence (HCC)   Seizure disorder (HCC)   Substance induced mood disorder (HCC)   Esophageal 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 (HCC)   Hyperkalemia   Ascites    Discharge Instructions  Discharge Instructions     Call MD for:   Complete by: As directed    ANY ACUTE CHANGE IN MEDICAL CONDITION   Diet - low sodium heart healthy   Complete by: As directed    Increase activity slowly   Complete by: As directed       Allergies as of 02/02/2022       Reactions   Other Other (See Comments)   Platelets: Rx chest pain, tremors, body aches        Medication List     TAKE these medications    ARIPiprazole 10 MG tablet Commonly known as: ABILIFY Take 1 tablet by mouth every day What changed:  how much to take how to take this when to take this   clonazePAM 0.5 MG tablet Commonly known as: KLONOPIN Take 1 tablet by mouth 2 times every day   FLUoxetine 40 MG capsule Commonly known as: PROZAC Take 1 capsule (40 mg total) by mouth daily. What changed: when to take this   folic acid 1 MG tablet Commonly known as: FOLVITE Take 1 tablet by mouth once daily   furosemide 40 MG tablet Commonly known as: LASIX Take 1 and 1/2 tablets by mouth once daily.   lactulose 10 GM/15ML solution Commonly known as: CHRONULAC Take 30 mLs (20 g total) by mouth 3 (three) times daily.   levETIRAcetam 750 MG tablet Commonly known as: KEPPRA Take 1 tablet (750 mg total) by mouth 2 (two) times daily.  nadolol 20 MG tablet Commonly known as: Corgard Take 1 tablet by mouth daily What changed:  how much to take how to take this when to take this   pantoprazole 40 MG tablet Commonly known as: PROTONIX Take 1 tablet (40 mg total) by mouth every morning.   potassium chloride 20 MEQ packet Commonly known as: Klor-Con Take 1 packet by mouth daily for 14 days.   predniSONE 20 MG tablet Commonly known as:  DELTASONE Take 1 tablet (20 mg total) by mouth 2 (two) times daily.   spironolactone 100 MG tablet Commonly known as: ALDACTONE Take 1 and 1/2 tablets by mouth once daily.   thiamine 100 MG tablet Take 1 tablet by mouth daily.        Allergies  Allergen Reactions   Other Other (See Comments)    Platelets: Rx chest pain, tremors, body aches    Consultations: GI   Procedures/Studies: CT ABDOMEN PELVIS WO CONTRAST  Result Date: 01/30/2022 CLINICAL DATA:  Acute nonlocalized abdominal pain. Fell yesterday. Bilateral lower extremity edema. History of paracentesis, pancreatic cancer, cirrhosis. EXAM: CT ABDOMEN AND PELVIS WITHOUT CONTRAST TECHNIQUE: Multidetector CT imaging of the abdomen and pelvis was performed following the standard protocol without IV contrast. RADIATION DOSE REDUCTION: This exam was performed according to the departmental dose-optimization program which includes automated exposure control, adjustment of the mA and/or kV according to patient size and/or use of iterative reconstruction technique. COMPARISON:  12/07/2021 FINDINGS: Lower chest: Mild dependent atelectasis in the lung bases. Calcified granuloma on the left with calcified left hilar lymph nodes. Small esophageal hiatal hernia. Multiple prominent paraesophageal varices are better demonstrated on the previous contrast-enhanced study. Hepatobiliary: Diffuse fatty infiltration of the liver as seen previously. Nodular contour with enlarged lateral segment left lobe consistent with cirrhosis. Cholelithiasis with a large stone in the gallbladder. No discrete inflammatory stranding. Mild gallbladder wall thickening is likely due to liver disease. Pancreas: Calcifications in the head of the pancreas likely related to chronic pancreatitis. No pancreatic ductal dilatation or focal lesion identified on noncontrast imaging. Spleen: Spleen is enlarged.  Calcified granulomas. Adrenals/Urinary Tract: No adrenal gland nodules.  Kidneys are symmetrical. No hydronephrosis or hydroureter. Bladder is unremarkable. Stomach/Bowel: Stomach, small bowel, and colon are not abnormally distended. Stool fills the colon. Mild right colonic wall thickening may be due to portal venous colopathy. Appendix is normal. Vascular/Lymphatic: Scattered abdominal and retroperitoneal lymph nodes are likely related to liver disease. Reproductive: Uterus and bilateral adnexa are unremarkable. Other: Large amount of diffuse abdominal and pelvic ascites. No free air. Abdominal wall musculature appears intact. Musculoskeletal: No acute or significant osseous findings. IMPRESSION: 1. Changes of hepatic cirrhosis and fatty liver with splenomegaly, prominent upper abdominal and paraesophageal varices, and severe abdominal and pelvic ascites. 2. Small esophageal hiatal hernia. 3. Calcifications in the head of the pancreas likely relate to chronic pancreatitis. 4. Mild colonic wall thickening may be due to portal hypertensive colopathy. 5. Cholelithiasis. Gallbladder wall thickening is likely due to liver disease. Electronically Signed   By: Lucienne Capers M.D.   On: 01/30/2022 20:19   US Paracentesis  Result Date: 01/31/2022 INDICATION: Patient with history of alcoholic cirrhosis, recurrent ascites, esophageal varices. Request received for diagnostic and therapeutic paracentesis up to 5 liters. EXAM: ULTRASOUND GUIDED DIAGNOSTIC AND THERAPEUTIC PARACENTESIS MEDICATIONS: 8 mL 1% lidocaine COMPLICATIONS: None immediate. PROCEDURE: Informed written consent was obtained from the patient after a discussion of the risks, benefits and alternatives to treatment. A timeout was performed prior to the initiation  of the procedure. Initial ultrasound scanning demonstrates a moderate to large amount of ascites within the left lower abdominal quadrant. The left lower abdomen was prepped and draped in the usual sterile fashion. 1% lidocaine was used for local anesthesia. Following  this, a 19 gauge, 10-cm, Yueh catheter was introduced. An ultrasound image was saved for documentation purposes. The paracentesis was performed. The catheter was removed and a dressing was applied. The patient tolerated the procedure well without immediate post procedural complication. FINDINGS: A total of approximately 3.6 liters of yellow fluid was removed. Samples were sent to the laboratory as requested by the clinical team. IMPRESSION: Successful ultrasound-guided paracentesis yielding 3.6 liters of peritoneal fluid. Read by: Rowe Robert, PA-C PLAN: If the patient eventually requires >/=2 paracenteses in a 30 day period, candidacy for formal evaluation by the [facility] will be assessed. Michaelle Birks, MD [facility] Electronically Signed   By: Michaelle Birks M.D.   On: 01/31/2022 16:05   US Paracentesis  Result Date: 01/23/2022 INDICATION: History of cirrhosis with recurrent ascites. Request for therapeutic and diagnostic paracentesis. EXAM: ULTRASOUND GUIDED DIAGNOSTIC AND THERAPEUTIC RIGHT LOWER QUADRANT PARACENTESIS MEDICATIONS: 10 mL 1 % lidocaine COMPLICATIONS: None immediate. PROCEDURE: Informed written consent was obtained from the patient after a discussion of the risks, benefits and alternatives to treatment. A timeout was performed prior to the initiation of the procedure. Initial ultrasound scanning demonstrates a large amount of ascites within the right lower abdominal quadrant. The right lower abdomen was prepped and draped in the usual sterile fashion. 1% lidocaine was used for local anesthesia. Following this, a 19 gauge, 10-cm, Yueh catheter was introduced. An ultrasound image was saved for documentation purposes. The paracentesis was performed. The catheter was removed and a dressing was applied. The patient tolerated the procedure well without immediate post procedural complication.  FINDINGS: A total of approximately 4 L of cloudy, yellow fluid was removed. Samples were sent to the laboratory as requested by the clinical team. IMPRESSION: Successful ultrasound-guided paracentesis yielding 4 liters of peritoneal fluid. Read by: Narda Rutherford, AGNP-BC Electronically Signed   By: Ruthann Cancer M.D.   On: 01/23/2022 07:55   DG Chest Port 1 View  Result Date: 01/30/2022 CLINICAL DATA:  Shortness of breath. Abdominal distention. Bilateral lower extremity edema. EXAM: PORTABLE CHEST 1 VIEW COMPARISON:  01/22/2022 FINDINGS: Heart size and pulmonary vascularity are normal. Shallow inspiration with elevation of the right hemidiaphragm. No edema or consolidation in the lungs. Calcified lymph nodes in the left hilum. No pleural effusions. No pneumothorax. Mediastinal contours appear intact. Degenerative changes in the shoulders. Postoperative change in the right shoulder. IMPRESSION: No active disease. Electronically Signed   By: Lucienne Capers M.D.   On: 01/30/2022 19:27   DG Chest Portable 1 View  Result Date: 01/22/2022 CLINICAL DATA:  Short of breath, history of pancreatic cancer and cirrhosis EXAM: PORTABLE CHEST 1 VIEW COMPARISON:  01/12/2022 FINDINGS: Single frontal view of the chest demonstrates a stable cardiac silhouette. Stable calcified left hilar lymph nodes. No airspace disease, effusion, or pneumothorax. No acute bony abnormalities. IMPRESSION: 1. No acute intrathoracic process. Electronically Signed   By: Randa Ngo M.D.   On: 01/22/2022 17:59   [place] ASCITES (ABDOMEN LIMITED)  Result Date: 01/23/2022 CLINICAL DATA:  Ascites. EXAM: LIMITED ABDOMEN ULTRASOUND FOR ASCITES TECHNIQUE: Limited ultrasound survey for ascites was performed in all four abdominal quadrants. COMPARISON:  Jan 22, 2022. FINDINGS: Minimal ascites is noted in all  4 quadrants of the abdomen. No adequate pocket for paracentesis is noted. IMPRESSION: Minimal ascites is noted. No adequate pocket for paracentesis  is noted. The patient did undergo paracentesis yesterday. Electronically Signed   By: Marijo Conception M.D.   On: 01/23/2022 11:52      Subjective: Reports she feels at baseline and is ready to be discharged home  Discharge Exam: Vitals:   02/01/22 2043 02/02/22 0216  BP: 116/80 (!) 159/66  Pulse: 77 100  Resp: 18 17  Temp: 97.6 F (36.4 C) 97.8 F (36.6 C)  SpO2: 96%    Vitals:   02/01/22 1046 02/01/22 2043 02/02/22 0216 02/02/22 0544  BP: (!) 122/96 116/80 (!) 159/66   Pulse: 99 77 100   Resp: '16 18 17   '$ Temp: 98.5 F (36.9 C) 97.6 F (36.4 C) 97.8 F (36.6 C)   TempSrc: Oral Oral Oral   SpO2: 96% 96%    Weight:    65.7 kg  Height:        General: Pt is alert, awake, not in acute distress Cardiovascular: RRR, S1/S2 +, no rubs, no gallops Respiratory: CTA bilaterally, no wheezing, no rhonchi Abdominal: Soft, NT, CHRONIC DISTENSION, , bowel sounds + Extremities: no edema, no cyanosis    The results of significant diagnostics from this hospitalization (including imaging, microbiology, ancillary and laboratory) are listed below for reference.     Microbiology: Recent Results (from the past 240 hour(s))  Culture, body fluid w Gram Stain-bottle     Status: None (Preliminary result)   Collection Time: 01/31/22  8:36 PM   Specimen: Peritoneal Washings  Result Value Ref Range Status   Specimen Description PERITONEAL  Final   Special Requests NONE  Final   Culture   Final    NO GROWTH 2 DAYS Performed at [facility], [address], [place], [place] [address]    Report Status PENDING  Incomplete  Gram stain     Status: None   Collection Time: 01/31/22  8:36 PM   Specimen: Peritoneal Washings  Result Value Ref Range Status   Specimen Description PERITONEAL  Final   Special Requests NONE  Final   Gram Stain   Final    NO WBC SEEN NO ORGANISMS SEEN Performed at [facility], [address], [place], [place] [address]    Report Status 01/31/2022  FINAL  Final     Labs: BNP (last 3 results) Recent Labs    01/22/22 1727  BNP 02.5   Basic Metabolic Panel: Recent Labs  Lab 01/30/22 1934 01/31/22 0401 02/01/22 0442 02/02/22 0345  NA 133* 136 133* 129*  K 5.2* 3.6 3.9 3.9  CL 107 109 106 103  CO2 21* 22 21* 20*  GLUCOSE 118* 103* 108* 158*  BUN '7 7 7 7  '$ CREATININE 0.44 0.62 0.60 0.51  CALCIUM 7.6* 8.1* 8.0* 8.2*  MG  --   --  1.7 1.8   Liver Function Tests: Recent Labs  Lab 01/30/22 1934 01/31/22 0401 02/01/22 0442 02/02/22 0345  AST 120* 80* 74* 61*  ALT '26 20 21 21  '$ ALKPHOS 222* 216* 191* 176*  BILITOT 5.6* 5.7* 8.0* 7.3*  PROT 6.2* 6.6 6.4* 6.5  ALBUMIN 2.1* 2.0* 2.0* 2.0*   No results for input(s): LIPASE, AMYLASE in the last 168 hours. No results for input(s): AMMONIA in the last 168 hours. CBC: Recent Labs  Lab 01/28/22 0000 01/30/22 1934 01/31/22 0401 02/01/22 0442  WBC 3.7 3.0* 2.6*  3.0*  NEUTROABS 2.11 1.6*  --   --   HGB 8.6* 8.1* 7.9* 7.7*  HCT 29* 27.1* 25.8* 26.1*  MCV  --  81.4 79.4* 80.8  PLT 44* 61* 50* 51*   Cardiac Enzymes: No results for input(s): CKTOTAL, CKMB, CKMBINDEX, TROPONINI in the last 168 hours. BNP: Invalid input(s): POCBNP CBG: No results for input(s): GLUCAP in the last 168 hours. D-Dimer No results for input(s): DDIMER in the last 72 hours. Hgb A1c No results for input(s): HGBA1C in the last 72 hours. Lipid Profile No results for input(s): CHOL, HDL, LDLCALC, TRIG, CHOLHDL, LDLDIRECT in the last 72 hours. Thyroid function studies No results for input(s): TSH, T4TOTAL, T3FREE, THYROIDAB in the last 72 hours.  Invalid input(s): FREET3 Anemia work up No results for input(s): VITAMINB12, FOLATE, FERRITIN, TIBC, IRON, RETICCTPCT in the last 72 hours. Urinalysis    Component Value Date/Time   COLORURINE YELLOW 01/22/2022 1815   APPEARANCEUR CLEAR 01/22/2022 1815   LABSPEC 1.004 (L) 01/22/2022 1815   PHURINE 9.0 (H) 01/22/2022 1815   GLUCOSEU NEGATIVE  01/22/2022 1815   HGBUR NEGATIVE 01/22/2022 1815   BILIRUBINUR NEGATIVE 01/22/2022 1815   BILIRUBINUR NEG 05/26/2020 1544   KETONESUR NEGATIVE 01/22/2022 1815   PROTEINUR NEGATIVE 01/22/2022 1815   UROBILINOGEN 1.0 05/26/2020 1544   UROBILINOGEN 0.2 12/02/2017 0836   NITRITE NEGATIVE 01/22/2022 1815   LEUKOCYTESUR NEGATIVE 01/22/2022 1815   Sepsis Labs Invalid input(s): PROCALCITONIN,  WBC,  LACTICIDVEN Microbiology Recent Results (from the past 240 hour(s))  Culture, body fluid w Gram Stain-bottle     Status: None (Preliminary result)   Collection Time: 01/31/22  8:36 PM   Specimen: Peritoneal Washings  Result Value Ref Range Status   Specimen Description PERITONEAL  Final   Special Requests NONE  Final   Culture   Final    NO GROWTH 2 DAYS Performed at [facility] [address], [place], [place] [address]    Report Status PENDING  Incomplete  Gram stain     Status: None   Collection Time: 01/31/22  8:36 PM   Specimen: Peritoneal Washings  Result Value Ref Range Status   Specimen Description PERITONEAL  Final   Special Requests NONE  Final   Gram Stain   Final    NO WBC SEEN NO ORGANISMS SEEN Performed at [facility], [address] [place],  [address]    Report Status 01/31/2022 FINAL  Final     Time coordinating discharge: Over 30 minutes  SIGNED:   Nicolette Bang, MD  [facility] 02/02/2022, 11:59 AM Pager   If 7PM-7AM, please contact night-coverage [email or web address] Password [email or web address]

## 2022-02-05 ENCOUNTER — Encounter (HOSPITAL_COMMUNITY): Payer: Self-pay | Admitting: Gastroenterology

## 2022-02-05 ENCOUNTER — Inpatient Hospital Stay: Payer: Commercial Managed Care - HMO

## 2022-02-05 DIAGNOSIS — D696 Thrombocytopenia, unspecified: Secondary | ICD-10-CM

## 2022-02-05 LAB — CULTURE, BODY FLUID W GRAM STAIN -BOTTLE: Culture: NO GROWTH

## 2022-02-05 LAB — CBC AND DIFFERENTIAL
HCT: 30 — AB (ref 36–46)
Hemoglobin: 9.1 — AB (ref 12.0–16.0)
Neutrophils Absolute: 2.52
Platelets: 100 10*3/uL — AB (ref 150–400)
WBC: 4.2

## 2022-02-05 LAB — CBC: RBC: 3.74 — AB (ref 3.87–5.11)

## 2022-02-07 ENCOUNTER — Inpatient Hospital Stay: Payer: Medicaid Other | Attending: Hematology & Oncology

## 2022-02-07 VITALS — BP 97/77 | HR 96 | Temp 98.0°F | Resp 18 | Ht 59.0 in | Wt 141.5 lb

## 2022-02-07 DIAGNOSIS — D693 Immune thrombocytopenic purpura: Secondary | ICD-10-CM | POA: Insufficient documentation

## 2022-02-07 DIAGNOSIS — D509 Iron deficiency anemia, unspecified: Secondary | ICD-10-CM | POA: Insufficient documentation

## 2022-02-07 DIAGNOSIS — R161 Splenomegaly, not elsewhere classified: Secondary | ICD-10-CM | POA: Diagnosis not present

## 2022-02-07 DIAGNOSIS — K7030 Alcoholic cirrhosis of liver without ascites: Secondary | ICD-10-CM | POA: Insufficient documentation

## 2022-02-07 DIAGNOSIS — D696 Thrombocytopenia, unspecified: Secondary | ICD-10-CM

## 2022-02-07 MED ORDER — ROMIPLOSTIM 250 MCG ~~LOC~~ SOLR
3.7800 ug/kg | Freq: Once | SUBCUTANEOUS | Status: AC
  Administered 2022-02-07: 250 ug via SUBCUTANEOUS
  Filled 2022-02-07: qty 0.5

## 2022-02-07 NOTE — Patient Instructions (Signed)
Romiplostim injection ?What is this medication? ?ROMIPLOSTIM (roe mi PLOE stim) helps your body make more platelets. This medicine is used to treat low platelets caused by chronic idiopathic thrombocytopenic purpura (ITP) or a bone marrow syndrome caused by radiation sickness. ?This medicine may be used for other purposes; ask your health care provider or pharmacist if you have questions. ?COMMON BRAND NAME(S): Nplate ?What should I tell my care team before I take this medication? ?They need to know if you have any of these conditions: ?blood clots ?myelodysplastic syndrome ?an unusual or allergic reaction to romiplostim, mannitol, other medicines, foods, dyes, or preservatives ?pregnant or trying to get pregnant ?breast-feeding ?How should I use this medication? ?This medicine is injected under the skin. It is given by a health care provider in a hospital or clinic setting. ?A special MedGuide will be given to you before each treatment. Be sure to read this information carefully each time. ?Talk to your health care provider about the use of this medicine in children. While it may be prescribed for children as young as newborns for selected conditions, precautions do apply. ?Overdosage: If you think you have taken too much of this medicine contact a poison control center or emergency room at once. ?NOTE: This medicine is only for you. Do not share this medicine with others. ?What if I miss a dose? ?Keep appointments for follow-up doses. It is important not to miss your dose. Call your health care provider if you are unable to keep an appointment. ?What may interact with this medication? ?Interactions are not expected. ?This list may not describe all possible interactions. Give your health care provider a list of all the medicines, herbs, non-prescription drugs, or dietary supplements you use. Also tell them if you smoke, drink alcohol, or use illegal drugs. Some items may interact with your medicine. ?What should I  watch for while using this medication? ?Visit your health care provider for regular checks on your progress. You may need blood work done while you are taking this medicine. Your condition will be monitored carefully while you are receiving this medicine. It is important not to miss any appointments. ?What side effects may I notice from receiving this medication? ?Side effects that you should report to your doctor or health care professional as soon as possible: ?allergic reactions (skin rash, itching or hives; swelling of the face, lips, or tongue) ?bleeding (bloody or black, tarry stools; red or dark brown urine; spitting up blood or brown material that looks like coffee grounds; red spots on the skin; unusual bruising or bleeding from the eyes, gums, or nose) ?blood clot (chest pain; shortness of breath; pain, swelling, or warmth in the leg) ?stroke (changes in vision; confusion; trouble speaking or understanding; severe headaches; sudden numbness or weakness of the face, arm or leg; trouble walking; dizziness; loss of balance or coordination) ?Side effects that usually do not require medical attention (report to your doctor or health care professional if they continue or are bothersome): ?diarrhea ?dizziness ?headache ?joint pain ?muscle pain ?stomach pain ?trouble sleeping ?This list may not describe all possible side effects. Call your doctor for medical advice about side effects. You may report side effects to FDA at 1-800-FDA-1088. ?Where should I keep my medication? ?This medicine is given in a hospital or clinic. It will not be stored at home. ?NOTE: This sheet is a summary. It may not cover all possible information. If you have questions about this medicine, talk to your doctor, pharmacist, or health care provider. ??   2023 Elsevier/Gold Standard (2021-07-27 00:00:00) ? ?

## 2022-02-11 ENCOUNTER — Encounter: Payer: Self-pay | Admitting: Oncology

## 2022-02-11 ENCOUNTER — Inpatient Hospital Stay: Payer: Medicaid Other

## 2022-02-11 DIAGNOSIS — D696 Thrombocytopenia, unspecified: Secondary | ICD-10-CM

## 2022-02-11 LAB — CBC AND DIFFERENTIAL
HCT: 30 — AB (ref 36–46)
Hemoglobin: 9.2 — AB (ref 12.0–16.0)
Neutrophils Absolute: 8.02
Platelets: 189 10*3/uL (ref 150–400)
WBC: 9.9

## 2022-02-11 LAB — CBC: RBC: 3.81 — AB (ref 3.87–5.11)

## 2022-02-11 NOTE — Progress Notes (Signed)
Spoke with [facility] and scheduled patients transportation for 02/18/2022 and 02/20/2022.

## 2022-02-12 ENCOUNTER — Other Ambulatory Visit: Payer: Self-pay | Admitting: Pharmacist

## 2022-02-13 ENCOUNTER — Inpatient Hospital Stay: Payer: Medicaid Other

## 2022-02-13 VITALS — BP 108/70 | HR 99 | Temp 98.1°F | Resp 18 | Ht 59.0 in | Wt 141.0 lb

## 2022-02-13 DIAGNOSIS — D693 Immune thrombocytopenic purpura: Secondary | ICD-10-CM | POA: Diagnosis not present

## 2022-02-13 DIAGNOSIS — D696 Thrombocytopenia, unspecified: Secondary | ICD-10-CM

## 2022-02-13 MED ORDER — ROMIPLOSTIM 250 MCG ~~LOC~~ SOLR
3.9000 ug/kg | Freq: Once | SUBCUTANEOUS | Status: AC
  Administered 2022-02-13: 250 ug via SUBCUTANEOUS
  Filled 2022-02-13: qty 0.5

## 2022-02-13 NOTE — Patient Instructions (Signed)
Romiplostim injection ?What is this medication? ?ROMIPLOSTIM (roe mi PLOE stim) helps your body make more platelets. This medicine is used to treat low platelets caused by chronic idiopathic thrombocytopenic purpura (ITP) or a bone marrow syndrome caused by radiation sickness. ?This medicine may be used for other purposes; ask your health care provider or pharmacist if you have questions. ?COMMON BRAND NAME(S): Nplate ?What should I tell my care team before I take this medication? ?They need to know if you have any of these conditions: ?blood clots ?myelodysplastic syndrome ?an unusual or allergic reaction to romiplostim, mannitol, other medicines, foods, dyes, or preservatives ?pregnant or trying to get pregnant ?breast-feeding ?How should I use this medication? ?This medicine is injected under the skin. It is given by a health care provider in a hospital or clinic setting. ?A special MedGuide will be given to you before each treatment. Be sure to read this information carefully each time. ?Talk to your health care provider about the use of this medicine in children. While it may be prescribed for children as young as newborns for selected conditions, precautions do apply. ?Overdosage: If you think you have taken too much of this medicine contact a poison control center or emergency room at once. ?NOTE: This medicine is only for you. Do not share this medicine with others. ?What if I miss a dose? ?Keep appointments for follow-up doses. It is important not to miss your dose. Call your health care provider if you are unable to keep an appointment. ?What may interact with this medication? ?Interactions are not expected. ?This list may not describe all possible interactions. Give your health care provider a list of all the medicines, herbs, non-prescription drugs, or dietary supplements you use. Also tell them if you smoke, drink alcohol, or use illegal drugs. Some items may interact with your medicine. ?What should I  watch for while using this medication? ?Visit your health care provider for regular checks on your progress. You may need blood work done while you are taking this medicine. Your condition will be monitored carefully while you are receiving this medicine. It is important not to miss any appointments. ?What side effects may I notice from receiving this medication? ?Side effects that you should report to your doctor or health care professional as soon as possible: ?allergic reactions (skin rash, itching or hives; swelling of the face, lips, or tongue) ?bleeding (bloody or black, tarry stools; red or dark brown urine; spitting up blood or brown material that looks like coffee grounds; red spots on the skin; unusual bruising or bleeding from the eyes, gums, or nose) ?blood clot (chest pain; shortness of breath; pain, swelling, or warmth in the leg) ?stroke (changes in vision; confusion; trouble speaking or understanding; severe headaches; sudden numbness or weakness of the face, arm or leg; trouble walking; dizziness; loss of balance or coordination) ?Side effects that usually do not require medical attention (report to your doctor or health care professional if they continue or are bothersome): ?diarrhea ?dizziness ?headache ?joint pain ?muscle pain ?stomach pain ?trouble sleeping ?This list may not describe all possible side effects. Call your doctor for medical advice about side effects. You may report side effects to FDA at 1-800-FDA-1088. ?Where should I keep my medication? ?This medicine is given in a hospital or clinic. It will not be stored at home. ?NOTE: This sheet is a summary. It may not cover all possible information. If you have questions about this medicine, talk to your doctor, pharmacist, or health care provider. ??   2023 Elsevier/Gold Standard (2021-07-27 00:00:00) ? ?

## 2022-02-14 ENCOUNTER — Encounter: Payer: Self-pay | Admitting: Oncology

## 2022-02-14 ENCOUNTER — Other Ambulatory Visit (HOSPITAL_COMMUNITY): Payer: Self-pay

## 2022-02-14 MED ORDER — ARIPIPRAZOLE 10 MG PO TABS
ORAL_TABLET | ORAL | 5 refills | Status: DC
  Filled 2022-02-14: qty 30, 30d supply, fill #0

## 2022-02-14 MED ORDER — CLONAZEPAM 0.5 MG PO TABS
ORAL_TABLET | ORAL | 2 refills | Status: DC
  Filled 2022-02-14 – 2022-03-22 (×2): qty 60, 30d supply, fill #0
  Filled 2022-04-26: qty 60, 30d supply, fill #1
  Filled 2022-05-27: qty 60, 30d supply, fill #2

## 2022-02-14 MED ORDER — FLUOXETINE HCL 40 MG PO CAPS
40.0000 mg | ORAL_CAPSULE | Freq: Every day | ORAL | 5 refills | Status: DC
  Filled 2022-02-14: qty 30, 30d supply, fill #0

## 2022-02-17 NOTE — Progress Notes (Unsigned)
[facility]  [address] [place],  [place]  [address] [phone]  Clinic Day:  02/18/2022  Referring physician: Marco Collie, MD   HISTORY OF PRESENT ILLNESS:  The patient is a 51 y.o. 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  She has chronically been receiving weekly Nplate to keep her platelets at a suitable level.  She comes in today to reassess her platelets.  Within the past few weeks, the patient was hospitalized in [place] for recurrent ascites, which led to a repeat paracentesis.  Per both hospital records and the patient's account, she has been receiving prednisone 40 mg daily with the apparent goal being to minimize the frequency of her recurrent ascites.  Since being placed on prednisone, her platelets have unsurprisingly risen to more optimal levels.  She denies having any underlying bleeding/bruising complications as it pertains to her thrombocytopenia.  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  However, she admits to drinking regularly; she had a shot of alcohol today before coming into clinic.    PHYSICAL EXAM:  Blood pressure 140/75, pulse (!) 128, temperature 98 F (36.7 C), resp. rate 18, height '4\' 11"'$  (1.499 m), weight 138 lb 11.2 oz (62.9 kg), SpO2 99 %. Wt Readings from Last 3 Encounters:  02/18/22 138 lb 11.2 oz (62.9 kg)  02/13/22 141 lb (64 kg)  02/07/22 141 lb 8 oz (64.2 kg)   Body mass index is 28.01 kg/m. Performance status (ECOG): 1 - Symptomatic but completely ambulatory Physical Exam Constitutional:      Appearance: Normal appearance. She is not ill-appearing.  HENT:     Mouth/Throat:     Mouth: Mucous membranes are moist.     Pharynx: Oropharynx is clear. No oropharyngeal exudate or posterior oropharyngeal erythema.  Cardiovascular:     Rate and  Rhythm: Normal rate and regular rhythm.     Heart sounds: No murmur heard.    No friction rub. No gallop.  Pulmonary:     Effort: Pulmonary effort is normal. No respiratory distress.     Breath sounds: Normal breath sounds. No wheezing, rhonchi or rales.  Abdominal:     General: Abdomen is protuberant. Bowel sounds are normal. There is no distension.     Palpations: Abdomen is soft. There is no mass.     Tenderness: There is no abdominal tenderness.  Musculoskeletal:        General: No swelling.     Right lower leg: No edema.     Left lower leg: No edema.  Lymphadenopathy:     Cervical: No cervical adenopathy.     Upper Body:     Right upper body: No supraclavicular or axillary adenopathy.     Left upper body: No supraclavicular or axillary adenopathy.     Lower Body: No right inguinal adenopathy. No left inguinal adenopathy.  Skin:    General: Skin is warm.     Coloration: Skin is not jaundiced.     Findings: No lesion or rash.  Neurological:     General: No focal deficit present.     Mental Status: She is alert and oriented to person, place, and time. Mental status is at baseline.  Psychiatric:        Mood and Affect: Mood normal.        Behavior: Behavior normal.        Thought  Content: Thought content normal.    LABS:      Latest Reference Range & Units 02/18/22 14:20  Iron 28 - 170 ug/dL 27 (L)  UIBC ug/dL 390  TIBC 250 - 450 ug/dL 417  Saturation Ratios 10.4 - 31.8 % 6 (L)  Ferritin 11 - 307 ng/mL 12  (L): Data is abnormally low     Latest Ref Rng & Units 02/18/2022   12:00 AM 02/02/2022    3:45 AM 02/01/2022    4:42 AM  CMP  Glucose 70 - 99 mg/dL  158  108   BUN 4 - '21 10     7  7   '$ Creatinine 0.5 - 1.1 0.5     0.51  0.60   Sodium 137 - 147 133     129  133   Potassium 3.5 - 5.1 mEq/L 3.5     3.9  3.9   Chloride 99 - 108 97     103  106   CO2 13 - '22 21     20  21   '$ Calcium 8.7 - 10.7 7.5     8.2  8.0   Total Protein 6.5 - 8.1 g/dL  6.5  6.4   Total  Bilirubin 0.3 - 1.2 mg/dL  7.3  8.0   Alkaline Phos 25 - 125 288     176  191   AST 13 - 35 83     61  74   ALT 7 - 35 U/L 35     21  21      This result is from an external source.   STUDIES:  US Paracentesis  Result Date: 01/31/2022 INDICATION: Patient with history of alcoholic cirrhosis, recurrent ascites, esophageal varices. Request received for diagnostic and therapeutic paracentesis up to 5 liters. EXAM: ULTRASOUND GUIDED DIAGNOSTIC AND THERAPEUTIC PARACENTESIS MEDICATIONS: 8 mL 1% lidocaine COMPLICATIONS: None immediate. PROCEDURE: Informed written consent was obtained from the patient after a discussion of the risks, benefits and alternatives to treatment. A timeout was performed prior to the initiation of the procedure. Initial ultrasound scanning demonstrates a moderate to large amount of ascites within the left lower abdominal quadrant. The left lower abdomen was prepped and draped in the usual sterile fashion. 1% lidocaine was used for local anesthesia. Following this, a 19 gauge, 10-cm, Yueh catheter was introduced. An ultrasound image was saved for documentation purposes. The paracentesis was performed. The catheter was removed and a dressing was applied. The patient tolerated the procedure well without immediate post procedural complication. FINDINGS: A total of approximately 3.6 liters of yellow fluid was removed. Samples were sent to the laboratory as requested by the clinical team. IMPRESSION: Successful ultrasound-guided paracentesis yielding 3.6 liters of peritoneal fluid. Read by: Rowe Robert, PA-C PLAN: If the patient eventually requires >/=2 paracenteses in a 30 day period, candidacy for formal evaluation by the [facility] will be assessed. Michaelle Birks, MD [facility] Electronically Signed   By: Michaelle Birks M.D.   On: 01/31/2022 16:05   CT ABDOMEN PELVIS WO  CONTRAST  Result Date: 01/30/2022 CLINICAL DATA:  Acute nonlocalized abdominal pain. Fell yesterday. Bilateral lower extremity edema. History of paracentesis, pancreatic cancer, cirrhosis. EXAM: CT ABDOMEN AND PELVIS WITHOUT CONTRAST TECHNIQUE: Multidetector CT imaging of the abdomen and pelvis was performed following the standard protocol without IV contrast. RADIATION DOSE REDUCTION: This exam was performed according to the departmental dose-optimization program which includes automated exposure  control, adjustment of the mA and/or kV according to patient size and/or use of iterative reconstruction technique. COMPARISON:  12/07/2021 FINDINGS: Lower chest: Mild dependent atelectasis in the lung bases. Calcified granuloma on the left with calcified left hilar lymph nodes. Small esophageal hiatal hernia. Multiple prominent paraesophageal varices are better demonstrated on the previous contrast-enhanced study. Hepatobiliary: Diffuse fatty infiltration of the liver as seen previously. Nodular contour with enlarged lateral segment left lobe consistent with cirrhosis. Cholelithiasis with a large stone in the gallbladder. No discrete inflammatory stranding. Mild gallbladder wall thickening is likely due to liver disease. Pancreas: Calcifications in the head of the pancreas likely related to chronic pancreatitis. No pancreatic ductal dilatation or focal lesion identified on noncontrast imaging. Spleen: Spleen is enlarged.  Calcified granulomas. Adrenals/Urinary Tract: No adrenal gland nodules. Kidneys are symmetrical. No hydronephrosis or hydroureter. Bladder is unremarkable. Stomach/Bowel: Stomach, small bowel, and colon are not abnormally distended. Stool fills the colon. Mild right colonic wall thickening may be due to portal venous colopathy. Appendix is normal. Vascular/Lymphatic: Scattered abdominal and retroperitoneal lymph nodes are likely related to liver disease. Reproductive: Uterus and bilateral adnexa are  unremarkable. Other: Large amount of diffuse abdominal and pelvic ascites. No free air. Abdominal wall musculature appears intact. Musculoskeletal: No acute or significant osseous findings. IMPRESSION: 1. Changes of hepatic cirrhosis and fatty liver with splenomegaly, prominent upper abdominal and paraesophageal varices, and severe abdominal and pelvic ascites. 2. Small esophageal hiatal hernia. 3. Calcifications in the head of the pancreas likely relate to chronic pancreatitis. 4. Mild colonic wall thickening may be due to portal hypertensive colopathy. 5. Cholelithiasis. Gallbladder wall thickening is likely due to liver disease. Electronically Signed   By: Lucienne Capers M.D.   On: 01/30/2022 20:19   DG Chest Port 1 View  Result Date: 01/30/2022 CLINICAL DATA:  Shortness of breath. Abdominal distention. Bilateral lower extremity edema. EXAM: PORTABLE CHEST 1 VIEW COMPARISON:  01/22/2022 FINDINGS: Heart size and pulmonary vascularity are normal. Shallow inspiration with elevation of the right hemidiaphragm. No edema or consolidation in the lungs. Calcified lymph nodes in the left hilum. No pleural effusions. No pneumothorax. Mediastinal contours appear intact. Degenerative changes in the shoulders. Postoperative change in the right shoulder. IMPRESSION: No active disease. Electronically Signed   By: Lucienne Capers M.D.   On: 01/30/2022 19:27   [place] ASCITES (ABDOMEN LIMITED)  Result Date: 01/23/2022 CLINICAL DATA:  Ascites. EXAM: LIMITED ABDOMEN ULTRASOUND FOR ASCITES TECHNIQUE: Limited ultrasound survey for ascites was performed in all four abdominal quadrants. COMPARISON:  Jan 22, 2022. FINDINGS: Minimal ascites is noted in all 4 quadrants of the abdomen. No adequate pocket for paracentesis is noted. IMPRESSION: Minimal ascites is noted. No adequate pocket for paracentesis is noted. The patient did undergo paracentesis yesterday. Electronically Signed   By: Marijo Conception M.D.   On: 01/23/2022 11:52    US Paracentesis  Result Date: 01/23/2022 INDICATION: History of cirrhosis with recurrent ascites. Request for therapeutic and diagnostic paracentesis. EXAM: ULTRASOUND GUIDED DIAGNOSTIC AND THERAPEUTIC RIGHT LOWER QUADRANT PARACENTESIS MEDICATIONS: 10 mL 1 % lidocaine COMPLICATIONS: None immediate. PROCEDURE: Informed written consent was obtained from the patient after a discussion of the risks, benefits and alternatives to treatment. A timeout was performed prior to the initiation of the procedure. Initial ultrasound scanning demonstrates a large amount of ascites within the right lower abdominal quadrant. The right lower abdomen was prepped and draped in the usual sterile fashion. 1% lidocaine was used for local anesthesia. Following this, a 19 gauge,  10-cm, Yueh catheter was introduced. An ultrasound image was saved for documentation purposes. The paracentesis was performed. The catheter was removed and a dressing was applied. The patient tolerated the procedure well without immediate post procedural complication. FINDINGS: A total of approximately 4 L of cloudy, yellow fluid was removed. Samples were sent to the laboratory as requested by the clinical team. IMPRESSION: Successful ultrasound-guided paracentesis yielding 4 liters of peritoneal fluid. Read by: Narda Rutherford, AGNP-BC Electronically Signed   By: Ruthann Cancer M.D.   On: 01/23/2022 07:55   DG Chest Portable 1 View  Result Date: 01/22/2022 CLINICAL DATA:  Short of breath, history of pancreatic cancer and cirrhosis EXAM: PORTABLE CHEST 1 VIEW COMPARISON:  01/12/2022 FINDINGS: Single frontal view of the chest demonstrates a stable cardiac silhouette. Stable calcified left hilar lymph nodes. No airspace disease, effusion, or pneumothorax. No acute bony abnormalities. IMPRESSION: 1. No acute intrathoracic process. Electronically Signed   By: Randa Ngo M.D.   On: 01/22/2022 17:59      ASSESSMENT & PLAN:  Assessment/Plan:  A 51 y.o. female  with thrombocytopenia secondary to both ITP and alcoholic cirrhosis/secondary splenomegaly.  I am pleased with her platelet count today, which reflects the benefits of steroids in ITP.  She will continue to receive her weekly Nplate injections.  If her platelets rise above 250, her weekly Nplate injections will be held.  However, she is only scheduled to be on prednisone for approximately 6 weeks.  She understands that her platelet count could abruptly fall a few weeks after the discontinuation of her prednisone.  Her platelets will continue to be followed weekly to ensure they remain at ideal levels, irrespe  ctive of how long she stays on prednisone.  Once again, I had a stern conversation with her about her alcohol consumption.  She understands her liver is no longer able to tolerate any form of alcohol as she has end-stage liver disease.  However, do not get the sense she is serious about complete alcohol abstinence.  Although her platelets are better, her labs today clearly show recurrent iron deficiency anemia.  Based upon this, I will arrange for her to receive IV iron over these next few weeks.  Otherwise, I will see this patient back in 3 months for repeat clinical assessment.  The patient understands all the plans discussed today and is in agreement with them.    Haisley Arens Macarthur Critchley, MD

## 2022-02-18 ENCOUNTER — Other Ambulatory Visit: Payer: Self-pay

## 2022-02-18 ENCOUNTER — Inpatient Hospital Stay (INDEPENDENT_AMBULATORY_CARE_PROVIDER_SITE_OTHER): Payer: Medicaid Other | Admitting: Oncology

## 2022-02-18 ENCOUNTER — Inpatient Hospital Stay: Payer: Medicaid Other

## 2022-02-18 ENCOUNTER — Other Ambulatory Visit: Payer: Self-pay | Admitting: Oncology

## 2022-02-18 VITALS — BP 140/75 | HR 128 | Temp 98.0°F | Resp 18 | Ht 59.0 in | Wt 138.7 lb

## 2022-02-18 DIAGNOSIS — D693 Immune thrombocytopenic purpura: Secondary | ICD-10-CM | POA: Diagnosis not present

## 2022-02-18 DIAGNOSIS — D696 Thrombocytopenia, unspecified: Secondary | ICD-10-CM | POA: Diagnosis not present

## 2022-02-18 LAB — COMPREHENSIVE METABOLIC PANEL
Albumin: 3.3 — AB (ref 3.5–5.0)
Calcium: 7.5 — AB (ref 8.7–10.7)

## 2022-02-18 LAB — IRON AND TIBC
Iron: 27 ug/dL — ABNORMAL LOW (ref 28–170)
Saturation Ratios: 6 % — ABNORMAL LOW (ref 10.4–31.8)
TIBC: 417 ug/dL (ref 250–450)
UIBC: 390 ug/dL

## 2022-02-18 LAB — BASIC METABOLIC PANEL
BUN: 10 (ref 4–21)
CO2: 21 (ref 13–22)
Chloride: 97 — AB (ref 99–108)
Creatinine: 0.5 (ref 0.5–1.1)
Glucose: 95
Potassium: 3.5 mEq/L (ref 3.5–5.1)
Sodium: 133 — AB (ref 137–147)

## 2022-02-18 LAB — HEPATIC FUNCTION PANEL
ALT: 35 U/L (ref 7–35)
AST: 83 — AB (ref 13–35)
Alkaline Phosphatase: 288 — AB (ref 25–125)
Bilirubin, Total: 3.4

## 2022-02-18 LAB — CBC AND DIFFERENTIAL
HCT: 31 — AB (ref 36–46)
Hemoglobin: 9.3 — AB (ref 12.0–16.0)
Neutrophils Absolute: 7.03
Platelets: 174 10*3/uL (ref 150–400)
WBC: 9.5

## 2022-02-18 LAB — FERRITIN: Ferritin: 12 ng/mL (ref 11–307)

## 2022-02-18 LAB — CBC: RBC: 3.83 — AB (ref 3.87–5.11)

## 2022-02-19 ENCOUNTER — Encounter: Payer: Self-pay | Admitting: Oncology

## 2022-02-19 MED FILL — Ferumoxytol Inj 510 MG/17ML (30 MG/ML) (Elemental Fe): INTRAVENOUS | Qty: 17 | Status: AC

## 2022-02-19 NOTE — Progress Notes (Signed)
Spoke with [facility] and set up transportation for DOS 02/25/2022 and 02/27/2022.

## 2022-02-20 ENCOUNTER — Inpatient Hospital Stay: Payer: Medicaid Other

## 2022-02-20 VITALS — BP 108/71 | HR 104 | Temp 98.1°F | Resp 18 | Ht 59.0 in | Wt 141.0 lb

## 2022-02-20 DIAGNOSIS — D696 Thrombocytopenia, unspecified: Secondary | ICD-10-CM

## 2022-02-20 DIAGNOSIS — D693 Immune thrombocytopenic purpura: Secondary | ICD-10-CM | POA: Diagnosis not present

## 2022-02-20 MED ORDER — ROMIPLOSTIM 250 MCG ~~LOC~~ SOLR
3.9400 ug/kg | Freq: Once | SUBCUTANEOUS | Status: AC
  Administered 2022-02-20: 250 ug via SUBCUTANEOUS
  Filled 2022-02-20: qty 0.5

## 2022-02-20 MED ORDER — SODIUM CHLORIDE 0.9 % IV SOLN: Freq: Once | INTRAVENOUS | Status: AC

## 2022-02-20 MED ORDER — SODIUM CHLORIDE 0.9 % IV SOLN
510.0000 mg | Freq: Once | INTRAVENOUS | Status: AC
  Administered 2022-02-20: 510 mg via INTRAVENOUS
  Filled 2022-02-20: qty 510

## 2022-02-20 MED ORDER — FAMOTIDINE IN NACL 20-0.9 MG/50ML-% IV SOLN
20.0000 mg | Freq: Once | INTRAVENOUS | Status: AC
  Administered 2022-02-20: 20 mg via INTRAVENOUS
  Filled 2022-02-20: qty 50

## 2022-02-20 NOTE — Patient Instructions (Signed)
Ferumoxytol Injection What is this medication? FERUMOXYTOL (FER ue MOX i tol) treats low levels of iron in your body (iron deficiency anemia). Iron is a mineral that plays an important role in making red blood cells, which carry oxygen from your lungs to the rest of your body. This medicine may be used for other purposes; ask your health care provider or pharmacist if you have questions. COMMON BRAND NAME(S): Feraheme What should I tell my care team before I take this medication? They need to know if you have any of these conditions: Anemia not caused by low iron levels High levels of iron in the blood Magnetic resonance imaging (MRI) test scheduled An unusual or allergic reaction to iron, other medications, foods, dyes, or preservatives Pregnant or trying to get pregnant Breast-feeding How should I use this medication? This medication is for injection into a vein. It is given in a hospital or clinic setting. Talk to your care team the use of this medication in children. Special care may be needed. Overdosage: If you think you have taken too much of this medicine contact a poison control center or emergency room at once. NOTE: This medicine is only for you. Do not share this medicine with others. What if I miss a dose? It is important not to miss your dose. Call your care team if you are unable to keep an appointment. What may interact with this medication? Other iron products This list may not describe all possible interactions. Give your health care provider a list of all the medicines, herbs, non-prescription drugs, or dietary supplements you use. Also tell them if you smoke, drink alcohol, or use illegal drugs. Some items may interact with your medicine. What should I watch for while using this medication? Visit your care team regularly. Tell your care team if your symptoms do not start to get better or if they get worse. You may need blood work done while you are taking this  medication. You may need to follow a special diet. Talk to your care team. Foods that contain iron include: whole grains/cereals, dried fruits, beans, or peas, leafy green vegetables, and organ meats (liver, kidney). What side effects may I notice from receiving this medication? Side effects that you should report to your care team as soon as possible: Allergic reactions--skin rash, itching, hives, swelling of the face, lips, tongue, or throat Low blood pressure--dizziness, feeling faint or lightheaded, blurry vision Shortness of breath Side effects that usually do not require medical attention (report to your care team if they continue or are bothersome): Flushing Headache Joint pain Muscle pain Nausea Pain, redness, or irritation at injection site This list may not describe all possible side effects. Call your doctor for medical advice about side effects. You may report side effects to FDA at 1-800-FDA-1088. Where should I keep my medication? This medication is given in a hospital or clinic and will not be stored at home. NOTE: This sheet is a summary. It may not cover all possible information. If you have questions about this medicine, talk to your doctor, pharmacist, or health care provider.  2023 Elsevier/Gold Standard (2021-01-19 00:00:00) Romiplostim injection What is this medication? ROMIPLOSTIM (roe mi PLOE stim) helps your body make more platelets. This medicine is used to treat low platelets caused by chronic idiopathic thrombocytopenic purpura (ITP) or a bone marrow syndrome caused by radiation sickness. This medicine may be used for other purposes; ask your health care provider or pharmacist if you have questions. COMMON BRAND NAME(S):  Nplate What should I tell my care team before I take this medication? They need to know if you have any of these conditions: blood clots myelodysplastic syndrome an unusual or allergic reaction to romiplostim, mannitol, other medicines, foods,  dyes, or preservatives pregnant or trying to get pregnant breast-feeding How should I use this medication? This medicine is injected under the skin. It is given by a health care provider in a hospital or clinic setting. A special MedGuide will be given to you before each treatment. Be sure to read this information carefully each time. Talk to your health care provider about the use of this medicine in children. While it may be prescribed for children as young as newborns for selected conditions, precautions do apply. Overdosage: If you think you have taken too much of this medicine contact a poison control center or emergency room at once. NOTE: This medicine is only for you. Do not share this medicine with others. What if I miss a dose? Keep appointments for follow-up doses. It is important not to miss your dose. Call your health care provider if you are unable to keep an appointment. What may interact with this medication? Interactions are not expected. This list may not describe all possible interactions. Give your health care provider a list of all the medicines, herbs, non-prescription drugs, or dietary supplements you use. Also tell them if you smoke, drink alcohol, or use illegal drugs. Some items may interact with your medicine. What should I watch for while using this medication? Visit your health care provider for regular checks on your progress. You may need blood work done while you are taking this medicine. Your condition will be monitored carefully while you are receiving this medicine. It is important not to miss any appointments. What side effects may I notice from receiving this medication? Side effects that you should report to your doctor or health care professional as soon as possible: allergic reactions (skin rash, itching or hives; swelling of the face, lips, or tongue) bleeding (bloody or black, tarry stools; red or dark brown urine; spitting up blood or brown material that  looks like coffee grounds; red spots on the skin; unusual bruising or bleeding from the eyes, gums, or nose) blood clot (chest pain; shortness of breath; pain, swelling, or warmth in the leg) stroke (changes in vision; confusion; trouble speaking or understanding; severe headaches; sudden numbness or weakness of the face, arm or leg; trouble walking; dizziness; loss of balance or coordination) Side effects that usually do not require medical attention (report to your doctor or health care professional if they continue or are bothersome): diarrhea dizziness headache joint pain muscle pain stomach pain trouble sleeping This list may not describe all possible side effects. Call your doctor for medical advice about side effects. You may report side effects to FDA at 1-800-FDA-1088. Where should I keep my medication? This medicine is given in a hospital or clinic. It will not be stored at home. NOTE: This sheet is a summary. It may not cover all possible information. If you have questions about this medicine, talk to your doctor, pharmacist, or health care provider.  2023 Elsevier/Gold Standard (2021-07-27 00:00:00)

## 2022-02-25 ENCOUNTER — Inpatient Hospital Stay: Payer: Medicaid Other

## 2022-02-25 LAB — CBC AND DIFFERENTIAL
HCT: 30 — AB (ref 36–46)
Hemoglobin: 9.1 — AB (ref 12.0–16.0)
Neutrophils Absolute: 4.62
Platelets: 124 10*3/uL — AB (ref 150–400)
WBC: 5.5

## 2022-02-25 LAB — CBC: RBC: 3.61 — AB (ref 3.87–5.11)

## 2022-02-26 MED FILL — Ferumoxytol Inj 510 MG/17ML (30 MG/ML) (Elemental Fe): INTRAVENOUS | Qty: 17 | Status: AC

## 2022-02-27 ENCOUNTER — Emergency Department (HOSPITAL_COMMUNITY): Payer: Medicaid Other

## 2022-02-27 ENCOUNTER — Inpatient Hospital Stay (HOSPITAL_COMMUNITY)
Admission: EM | Admit: 2022-02-27 | Discharge: 2022-03-03 | DRG: 433 | Disposition: A | Discharge status: Home / Self Care | Payer: Medicaid Other | Attending: Family Medicine | Admitting: Family Medicine

## 2022-02-27 ENCOUNTER — Inpatient Hospital Stay: Payer: Medicaid Other

## 2022-02-27 ENCOUNTER — Encounter (HOSPITAL_COMMUNITY): Payer: Self-pay | Admitting: *Deleted

## 2022-02-27 ENCOUNTER — Observation Stay (HOSPITAL_COMMUNITY): Payer: Medicaid Other

## 2022-02-27 DIAGNOSIS — F419 Anxiety disorder, unspecified: Secondary | ICD-10-CM | POA: Diagnosis present

## 2022-02-27 DIAGNOSIS — R1900 Intra-abdominal and pelvic swelling, mass and lump, unspecified site: Secondary | ICD-10-CM | POA: Diagnosis present

## 2022-02-27 DIAGNOSIS — D696 Thrombocytopenia, unspecified: Secondary | ICD-10-CM | POA: Diagnosis present

## 2022-02-27 DIAGNOSIS — D509 Iron deficiency anemia, unspecified: Secondary | ICD-10-CM | POA: Diagnosis present

## 2022-02-27 DIAGNOSIS — Z79899 Other long term (current) drug therapy: Secondary | ICD-10-CM

## 2022-02-27 DIAGNOSIS — F209 Schizophrenia, unspecified: Secondary | ICD-10-CM | POA: Diagnosis present

## 2022-02-27 DIAGNOSIS — A0472 Enterocolitis due to Clostridium difficile, not specified as recurrent: Secondary | ICD-10-CM | POA: Diagnosis present

## 2022-02-27 DIAGNOSIS — B182 Chronic viral hepatitis C: Secondary | ICD-10-CM | POA: Diagnosis present

## 2022-02-27 DIAGNOSIS — F10239 Alcohol dependence with withdrawal, unspecified: Secondary | ICD-10-CM | POA: Diagnosis present

## 2022-02-27 DIAGNOSIS — Z888 Allergy status to other drugs, medicaments and biological substances status: Secondary | ICD-10-CM

## 2022-02-27 DIAGNOSIS — W010XXA Fall on same level from slipping, tripping and stumbling without subsequent striking against object, initial encounter: Secondary | ICD-10-CM | POA: Diagnosis present

## 2022-02-27 DIAGNOSIS — D693 Immune thrombocytopenic purpura: Secondary | ICD-10-CM | POA: Diagnosis present

## 2022-02-27 DIAGNOSIS — I8510 Secondary esophageal varices without bleeding: Secondary | ICD-10-CM | POA: Diagnosis present

## 2022-02-27 DIAGNOSIS — Z87442 Personal history of urinary calculi: Secondary | ICD-10-CM

## 2022-02-27 DIAGNOSIS — K219 Gastro-esophageal reflux disease without esophagitis: Secondary | ICD-10-CM | POA: Diagnosis present

## 2022-02-27 DIAGNOSIS — K7031 Alcoholic cirrhosis of liver with ascites: Principal | ICD-10-CM | POA: Diagnosis present

## 2022-02-27 DIAGNOSIS — F32A Depression, unspecified: Secondary | ICD-10-CM | POA: Diagnosis present

## 2022-02-27 DIAGNOSIS — S0511XA Contusion of eyeball and orbital tissues, right eye, initial encounter: Secondary | ICD-10-CM | POA: Diagnosis present

## 2022-02-27 DIAGNOSIS — Z7952 Long term (current) use of systemic steroids: Secondary | ICD-10-CM

## 2022-02-27 DIAGNOSIS — I8500 Esophageal varices without bleeding: Secondary | ICD-10-CM | POA: Diagnosis present

## 2022-02-27 DIAGNOSIS — F10939 Alcohol use, unspecified with withdrawal, unspecified: Secondary | ICD-10-CM | POA: Diagnosis present

## 2022-02-27 DIAGNOSIS — E876 Hypokalemia: Secondary | ICD-10-CM | POA: Diagnosis present

## 2022-02-27 DIAGNOSIS — G40909 Epilepsy, unspecified, not intractable, without status epilepticus: Secondary | ICD-10-CM | POA: Diagnosis present

## 2022-02-27 DIAGNOSIS — S0990XA Unspecified injury of head, initial encounter: Secondary | ICD-10-CM | POA: Diagnosis present

## 2022-02-27 DIAGNOSIS — K7030 Alcoholic cirrhosis of liver without ascites: Secondary | ICD-10-CM | POA: Diagnosis present

## 2022-02-27 DIAGNOSIS — Z9851 Tubal ligation status: Secondary | ICD-10-CM

## 2022-02-27 DIAGNOSIS — F319 Bipolar disorder, unspecified: Secondary | ICD-10-CM | POA: Diagnosis present

## 2022-02-27 DIAGNOSIS — E8809 Other disorders of plasma-protein metabolism, not elsewhere classified: Secondary | ICD-10-CM | POA: Diagnosis present

## 2022-02-27 DIAGNOSIS — F1020 Alcohol dependence, uncomplicated: Secondary | ICD-10-CM | POA: Diagnosis present

## 2022-02-27 DIAGNOSIS — D61818 Other pancytopenia: Secondary | ICD-10-CM | POA: Diagnosis present

## 2022-02-27 DIAGNOSIS — R161 Splenomegaly, not elsewhere classified: Secondary | ICD-10-CM | POA: Diagnosis present

## 2022-02-27 LAB — COMPREHENSIVE METABOLIC PANEL
ALT: 43 U/L (ref 0–44)
AST: 107 U/L — ABNORMAL HIGH (ref 15–41)
Albumin: 2.7 g/dL — ABNORMAL LOW (ref 3.5–5.0)
Alkaline Phosphatase: 253 U/L — ABNORMAL HIGH (ref 38–126)
Anion gap: 13 (ref 5–15)
BUN: 7 mg/dL (ref 6–20)
CO2: 22 mmol/L (ref 22–32)
Calcium: 8.6 mg/dL — ABNORMAL LOW (ref 8.9–10.3)
Chloride: 99 mmol/L (ref 98–111)
Creatinine, Ser: 0.48 mg/dL (ref 0.44–1.00)
GFR, Estimated: >60 mL/min (ref >60)
Glucose, Bld: 120 mg/dL — ABNORMAL HIGH (ref 70–99)
Potassium: 3.2 mmol/L — ABNORMAL LOW (ref 3.5–5.1)
Sodium: 134 mmol/L — ABNORMAL LOW (ref 135–145)
Total Bilirubin: 3.5 mg/dL — ABNORMAL HIGH (ref 0.3–1.2)
Total Protein: 7.2 g/dL (ref 6.5–8.1)

## 2022-02-27 LAB — LIPASE, BLOOD: Lipase: 75 U/L — ABNORMAL HIGH (ref 11–51)

## 2022-02-27 LAB — CBC WITH DIFFERENTIAL/PLATELET
Abs Immature Granulocytes: 0.04 10*3/uL (ref 0.00–0.07)
Basophils Absolute: 0 10*3/uL (ref 0.0–0.1)
Basophils Relative: 1 %
Eosinophils Absolute: 0 10*3/uL (ref 0.0–0.5)
Eosinophils Relative: 0 %
HCT: 31.4 % — ABNORMAL LOW (ref 36.0–46.0)
Hemoglobin: 9.6 g/dL — ABNORMAL LOW (ref 12.0–15.0)
Immature Granulocytes: 1 %
Lymphocytes Relative: 5 %
Lymphs Abs: 0.2 10*3/uL — ABNORMAL LOW (ref 0.7–4.0)
MCH: 26.1 pg (ref 26.0–34.0)
MCHC: 30.6 g/dL (ref 30.0–36.0)
MCV: 85.3 fL (ref 80.0–100.0)
Monocytes Absolute: 0.3 10*3/uL (ref 0.1–1.0)
Monocytes Relative: 5 %
Neutro Abs: 4.3 10*3/uL (ref 1.7–7.7)
Neutrophils Relative %: 88 %
Platelets: 91 10*3/uL — ABNORMAL LOW (ref 150–400)
RBC: 3.68 MIL/uL — ABNORMAL LOW (ref 3.87–5.11)
RDW: 28 % — ABNORMAL HIGH (ref 11.5–15.5)
WBC: 4.9 10*3/uL (ref 4.0–10.5)
nRBC: 0 % (ref 0.0–0.2)

## 2022-02-27 LAB — PROTIME-INR
INR: 1.3 — ABNORMAL HIGH (ref 0.8–1.2)
Prothrombin Time: 15.9 seconds — ABNORMAL HIGH (ref 11.4–15.2)

## 2022-02-27 LAB — I-STAT BETA HCG BLOOD, ED (MC, WL, AP ONLY): I-stat hCG, quantitative: <5 m[IU]/mL (ref <5)

## 2022-02-27 MED ORDER — THIAMINE HCL 100 MG PO TABS
100.0000 mg | ORAL_TABLET | Freq: Every day | ORAL | Status: DC
  Administered 2022-02-28 – 2022-03-02 (×3): 100 mg via ORAL
  Filled 2022-02-27 (×3): qty 1

## 2022-02-27 MED ORDER — THIAMINE HCL 100 MG/ML IJ SOLN
100.0000 mg | Freq: Every day | INTRAMUSCULAR | Status: DC
  Administered 2022-02-27: 100 mg via INTRAVENOUS
  Filled 2022-02-27: qty 2

## 2022-02-27 MED ORDER — NADOLOL 20 MG PO TABS
20.0000 mg | ORAL_TABLET | Freq: Every morning | ORAL | Status: DC
  Administered 2022-02-28 – 2022-03-03 (×4): 20 mg via ORAL
  Filled 2022-02-27 (×5): qty 1

## 2022-02-27 MED ORDER — FLUOXETINE HCL 20 MG PO CAPS
40.0000 mg | ORAL_CAPSULE | Freq: Every morning | ORAL | Status: DC
  Administered 2022-02-28 – 2022-03-03 (×4): 40 mg via ORAL
  Filled 2022-02-27 (×4): qty 2

## 2022-02-27 MED ORDER — SPIRONOLACTONE 25 MG PO TABS
50.0000 mg | ORAL_TABLET | Freq: Every day | ORAL | Status: DC
  Administered 2022-02-28 – 2022-03-03 (×4): 50 mg via ORAL
  Filled 2022-02-27 (×5): qty 2

## 2022-02-27 MED ORDER — FUROSEMIDE 40 MG PO TABS
40.0000 mg | ORAL_TABLET | Freq: Every day | ORAL | Status: DC
  Administered 2022-02-28 – 2022-03-03 (×4): 40 mg via ORAL
  Filled 2022-02-27 (×4): qty 1

## 2022-02-27 MED ORDER — LEVETIRACETAM 500 MG PO TABS
750.0000 mg | ORAL_TABLET | Freq: Two times a day (BID) | ORAL | Status: DC
  Administered 2022-02-27 – 2022-03-03 (×8): 750 mg via ORAL
  Filled 2022-02-27 (×9): qty 1

## 2022-02-27 MED ORDER — LORAZEPAM 1 MG PO TABS: 0.0000 mg | ORAL_TABLET | Freq: Two times a day (BID) | ORAL | Status: DC

## 2022-02-27 MED ORDER — LACTULOSE 10 GM/15ML PO SOLN
ORAL | Status: AC
  Filled 2022-02-27: qty 30

## 2022-02-27 MED ORDER — LORAZEPAM 2 MG/ML IJ SOLN
1.0000 mg | Freq: Once | INTRAMUSCULAR | Status: AC
  Administered 2022-02-27: 1 mg via INTRAVENOUS
  Filled 2022-02-27: qty 1

## 2022-02-27 MED ORDER — LORAZEPAM 2 MG/ML IJ SOLN: 0.0000 mg | Freq: Two times a day (BID) | INTRAMUSCULAR | Status: DC

## 2022-02-27 MED ORDER — LACTULOSE 10 GM/15ML PO SOLN
20.0000 g | Freq: Three times a day (TID) | ORAL | Status: DC
  Administered 2022-02-28 – 2022-03-03 (×10): 20 g via ORAL
  Filled 2022-02-27 (×10): qty 30

## 2022-02-27 MED ORDER — POTASSIUM CHLORIDE CRYS ER 20 MEQ PO TBCR
40.0000 meq | EXTENDED_RELEASE_TABLET | Freq: Once | ORAL | Status: AC
Start: 2022-02-27 — End: 2022-02-27
  Administered 2022-02-27: 40 meq via ORAL
  Filled 2022-02-27: qty 2

## 2022-02-27 MED ORDER — LORAZEPAM 1 MG PO TABS: 0.0000 mg | ORAL_TABLET | Freq: Four times a day (QID) | ORAL | Status: DC

## 2022-02-27 MED ORDER — ADULT MULTIVITAMIN W/MINERALS CH
1.0000 | ORAL_TABLET | Freq: Every day | ORAL | Status: DC
  Administered 2022-02-28 – 2022-03-02 (×3): 1 via ORAL
  Filled 2022-02-27 (×3): qty 1

## 2022-02-27 MED ORDER — ARIPIPRAZOLE 5 MG PO TABS
10.0000 mg | ORAL_TABLET | Freq: Every morning | ORAL | Status: DC
  Administered 2022-02-28 – 2022-03-03 (×4): 10 mg via ORAL
  Filled 2022-02-27 (×2): qty 1
  Filled 2022-02-27: qty 2
  Filled 2022-02-27: qty 1

## 2022-02-27 MED ORDER — METOCLOPRAMIDE HCL 5 MG/ML IJ SOLN
10.0000 mg | Freq: Once | INTRAMUSCULAR | Status: AC
  Administered 2022-02-27: 10 mg via INTRAVENOUS
  Filled 2022-02-27: qty 2

## 2022-02-27 MED ORDER — LORAZEPAM 1 MG PO TABS
1.0000 mg | ORAL_TABLET | ORAL | Status: AC | PRN
  Administered 2022-02-28: 3 mg via ORAL
  Administered 2022-03-01 (×2): 2 mg via ORAL
  Administered 2022-03-01: 1 mg via ORAL
  Administered 2022-03-02 (×2): 2 mg via ORAL
  Filled 2022-02-27: qty 2
  Filled 2022-02-27: qty 3
  Filled 2022-02-27: qty 1
  Filled 2022-02-27 (×4): qty 2

## 2022-02-27 MED ORDER — KETOROLAC TROMETHAMINE 15 MG/ML IJ SOLN
15.0000 mg | Freq: Once | INTRAMUSCULAR | Status: AC
  Administered 2022-02-27: 15 mg via INTRAVENOUS
  Filled 2022-02-27: qty 1

## 2022-02-27 MED ORDER — FOLIC ACID 1 MG PO TABS
1.0000 mg | ORAL_TABLET | Freq: Every day | ORAL | Status: DC
  Administered 2022-02-28 – 2022-03-02 (×3): 1 mg via ORAL
  Filled 2022-02-27 (×3): qty 1

## 2022-02-27 MED ORDER — PANTOPRAZOLE SODIUM 40 MG PO TBEC
40.0000 mg | DELAYED_RELEASE_TABLET | Freq: Every morning | ORAL | Status: DC
  Administered 2022-02-28 – 2022-03-03 (×4): 40 mg via ORAL
  Filled 2022-02-27 (×4): qty 1

## 2022-02-27 MED ORDER — THIAMINE HCL 100 MG/ML IJ SOLN: 100.0000 mg | Freq: Every day | INTRAMUSCULAR | Status: DC

## 2022-02-27 MED ORDER — LORAZEPAM 2 MG/ML IJ SOLN
1.0000 mg | INTRAMUSCULAR | Status: AC | PRN
  Administered 2022-02-28 (×2): 1 mg via INTRAVENOUS
  Filled 2022-02-27 (×2): qty 1

## 2022-02-27 MED ORDER — LORAZEPAM 2 MG/ML IJ SOLN: 0.0000 mg | Freq: Four times a day (QID) | INTRAMUSCULAR | Status: DC

## 2022-02-27 MED ORDER — THIAMINE HCL 100 MG PO TABS
100.0000 mg | ORAL_TABLET | Freq: Every day | ORAL | Status: DC
Start: 2022-02-27 — End: 2022-02-27

## 2022-02-27 MED ORDER — PREDNISONE 20 MG PO TABS
20.0000 mg | ORAL_TABLET | Freq: Two times a day (BID) | ORAL | Status: DC
Start: 2022-02-27 — End: 2022-03-03
  Administered 2022-02-27 – 2022-03-03 (×8): 20 mg via ORAL
  Filled 2022-02-27 (×8): qty 1

## 2022-02-27 NOTE — ED Notes (Addendum)
I-stat Beta <5.0

## 2022-02-27 NOTE — H&P (Signed)
History and Physical    Briana Sweeney CHE:[id number] DOB: [birth year] DOA: 02/27/2022  PCP: Marco Collie, MD  Patient coming from: Home  Chief Complaint: Abdominal distention  HPI: Briana Sweeney is a 51 y.o. 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  Recently admitted 5/16-5/18 for ascites and underwent paracentesis on 5/16 with removal of 4 L peritoneal fluid.  Admitted again 5/24-5/27 for ascites and underwent paracentesis on 5/25 with removal of 3.6 L peritoneal fluid.  EGD done 5/26 showing tiny hiatal hernia, grade 1 esophageal varices, and portal hypertensive gastropathy.  GI had recommended 40 mg of prednisone for 1 month in case some of her liver disease was due to alcoholic hepatitis based on her [place] discriminatory function.  Patient presented to the ED today complaining of abdominal distention.  Also reported a mechanical fall at home yesterday where she struck her head.  Patient was tachycardic and tremulous in the ED.  Labs showing WBC 4.9, hemoglobin 9.6 (stable), platelet count 91.  Sodium 134, potassium 3.2, chloride 99, bicarb 22, BUN 7, creatinine 0.4, glucose 120, calcium 8.6, albumin 2.7, AST 107, ALT 43, alk phos 253, T. bili 3.5, lipase 75, PT 15.9, INR 1.3.  Beta-hCG negative.  CT head negative for acute finding. Patient was given Ativan per CIWA protocol.  Also given Toradol, Reglan, and thiamine.  ED physician discussed the case with Dr. Pascal Lux with IR and ultrasound-guided paracentesis ordered.  Patient states she was walking her dog yesterday evening and fell as her dog pulled the leash.  She struck her head and felt dizzy afterwards but denies loss of consciousness.  She does not take any blood thinners.  She is also reporting  recurrent abdominal distention and discomfort for which she has undergone paracentesis in the past.  She is taking Lasix 40 mg daily and spironolactone 50 mg daily.  Denies fevers, nausea, or vomiting.  She is having regular bowel movements.  She continues to drink 24 ounces of beer daily.  No other complaints.  Review of Systems:  Review of Systems  All other systems reviewed and are negative.   Past Medical History:  Diagnosis Date   Abscess of bursa of left elbow 09/23/2018   Acute hyperactive alcohol withdrawal delirium ([place]) 10/09/2017   Acute low back pain with bilateral sciatica    Acute metabolic encephalopathy 53/61/4431   Acute on chronic pancreatitis ([place]) 04/04/2021   AKI (acute kidney injury) ([place]) 04/04/2021   Alcohol withdrawal syndrome with complication ([place]) 54/00/8676   Alcohol-induced acute pancreatitis    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[place]) 12/05/2018   Ascites due to alcoholic cirrhosis ([place])    Bipolar affective disorder ([place])    With anxiety features   Bunionette of right foot 11/2019   Cirrhosis of liver ([place])    Due to alcohol and hepatitis C   Cocaine dependence without complication ([place]) 19/50/9326   Decompensated hepatic cirrhosis ([place]) 07/23/2017   Depression with anxiety    Epistaxis 01/20/2019   Erysipelas 09/13/2018   Esophageal 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 ([place])    Esophageal varices without bleeding (H[facility])    ETOHism (H[facility])    GERD (gastroesophageal reflux disease)    Head injury    Hematemesis 02/10/2018   Hepatitis C 2018   hepatitis c and alcohol related hepatitis   Heroin abuse ([place])    History of blood transfusion    "  blood doesn't clot; I fell down and had to have a transfusion"   History of kidney stones    Menopause 2016   Migraine    "when I get really stressed" (09/01/2017)   Neuropathy 10/27/2018   Olecranon bursitis of left elbow    Other mixed anxiety disorders 10/27/2018   Overdose 08/22/2018   Pancytopenia ([place]) 07/23/2017    Polysubstance abuse ([place]) 04/08/2018   Portal hypertension ([place]) 03/18/2018   S/P foot surgery, right 11/2019   Schizophrenia ([place])    Seizures ([place])    "when I run out of my RX; lots recently" (09/01/2017)   Suicidal ideation    Thrombocytopenia ([place])    Trichimoniasis 10/30/2017   Upper GI bleed 02/10/2018   Urinary urgency 05/2020   UTI (urinary tract infection) 06/02/2017    Past Surgical History:  Procedure Laterality Date   BUNIONECTOMY     ESOPHAGEAL BANDING  06/26/2021   Procedure: ESOPHAGEAL BANDING;  Surgeon: Jackquline Denmark, MD;  Location: [facility] ENDOSCOPY;  Service: Endoscopy;;   ESOPHAGOGASTRODUODENOSCOPY N/A 09/03/2017   Procedure: ESOPHAGOGASTRODUODENOSCOPY (EGD);  Surgeon: Doran Stabler, MD;  Location: [facility];  Service: Gastroenterology;  Laterality: N/A;   ESOPHAGOGASTRODUODENOSCOPY N/A 02/01/2022   Procedure: ESOPHAGOGASTRODUODENOSCOPY (EGD);  Surgeon: Clarene Essex, MD;  Location: [facility];  Service: Gastroenterology;  Laterality: N/A;   ESOPHAGOGASTRODUODENOSCOPY (EGD) WITH PROPOFOL N/A 06/26/2021   Procedure: ESOPHAGOGASTRODUODENOSCOPY (EGD) WITH PROPOFOL;  Surgeon: Jackquline Denmark, MD;  Location: [facility]COPY;  Service: Endoscopy;  Laterality: N/A;   FINGER FRACTURE SURGERY Left    "shattered my pinky"   FRACTURE SURGERY     I & D EXTREMITY Left 09/18/2018   Procedure: IRRIGATION AND DEBRIDEMENT EXTREMITY;  Surgeon: Leanora Cover, MD;  Location: [facility];  Service: Orthopedics;  Laterality: Left;   IR PARACENTESIS  07/23/2017   IR PARACENTESIS  07/2017   "did it twice in the same week" (09/01/2017)   IR PARACENTESIS  06/26/2021   SHOULDER OPEN ROTATOR CUFF REPAIR Right    TOOTH EXTRACTION  08/2019   TUBAL LIGATION     VAGINAL HYSTERECTOMY       reports that she has never smoked. She has never used smokeless tobacco. She reports current alcohol use of about 6.0 standard drinks of alcohol per week. She reports that she does not currently use  drugs.  Allergies  Allergen Reactions   Other Other (See Comments)    Platelets: Rx chest pain, tremors, body aches    Family History  Problem Relation Age of Onset   Lung cancer Mother 28   Alcohol abuse Mother    Throat cancer Father 2    Prior to Admission medications   Medication Sig Start Date End Date Taking? Authorizing Provider  ARIPiprazole (ABILIFY) 10 MG tablet Take 1 tablet by mouth every day Patient taking differently: Take 10 mg by mouth every morning. 11/22/21  Yes   clonazePAM (KLONOPIN) 0.5 MG tablet TAKE 1 TABLET BY MOUTH TWICE DAILY Patient taking differently: Take 0.5 mg by mouth 2 (two) times daily as needed for anxiety. 02/14/22  Yes   FLUoxetine (PROZAC) 40 MG capsule Take 1 capsule (40 mg total) by mouth daily. Patient taking differently: Take 40 mg by mouth every morning. 6/37/85  Yes   folic acid (FOLVITE) 1 MG tablet Take 1 tablet (1 mg total) by mouth daily. 02/03/22 03/05/22 Yes Spongberg, Audie Pinto, MD  furosemide (LASIX) 40 MG tablet Take 40 mg by mouth daily.   Yes [provider]  lactulose (  CHRONULAC) 10 GM/15ML solution Take 30 mLs (20 g total) by mouth 3 (three) times daily. 01/24/22  Yes Aline August, MD  levETIRAcetam (KEPPRA) 750 MG tablet Take 1 tablet (750 mg total) by mouth 2 (two) times daily. 07/06/21  Yes Cameron Sprang, MD  nadolol (CORGARD) 20 MG tablet Take 1 tablet (20 mg total) by mouth every morning. 02/03/22 03/05/22 Yes Spongberg, Audie Pinto, MD  OVER THE COUNTER MEDICATION Take 1 capsule by mouth daily. OLLY Ultra brain soft gel   Yes [provider]  pantoprazole (PROTONIX) 40 MG tablet Take 1 tablet (40 mg total) by mouth every morning. 01/24/22  Yes Aline August, MD  potassium chloride (KLOR-CON) 20 MEQ packet Take 1 packet by mouth daily for 14 days. 01/24/22 02/27/22 Yes Aline August, MD  predniSONE (DELTASONE) 20 MG tablet Take 1 tablet (20 mg total) by mouth 2 (two) times daily. 02/02/22 05/03/22 Yes  Spongberg, Audie Pinto, MD  spironolactone (ALDACTONE) 50 MG tablet Take 3 tablets (150 mg total) by mouth daily. Patient taking differently: Take 50 mg by mouth daily. 02/03/22 03/05/22 Yes Spongberg, Audie Pinto, MD  ARIPiprazole (ABILIFY) 10 MG tablet Take 1 tablet by mouth every day. Patient not taking: Reported on 02/27/2022 02/14/22     FLUoxetine (PROZAC) 40 MG capsule Take 1 capsule (40 mg total) by mouth daily. Patient not taking: Reported on 02/27/2022 02/14/22     furosemide (LASIX) 20 MG tablet Take 3 tablets (60 mg total) by mouth daily. Patient not taking: Reported on 02/27/2022 02/03/22 03/05/22  Marcell Anger, MD  thiamine 100 MG tablet Take 1 tablet (100 mg total) by mouth daily. Patient not taking: Reported on 02/27/2022 02/03/22 03/05/22  Marcell Anger, MD    Physical Exam: Vitals:   02/27/22 2015 02/27/22 2030 02/27/22 2045 02/27/22 2100  BP: 120/82 110/86 118/87 (!) 120/93  Pulse: (!) 127 (!) 129 (!) 124 (!) 127  Resp: $Remo'19  19 18  'nYWHE$ Temp:      TempSrc:      SpO2: 96% 97% 92% 94%  Weight:      Height:        Physical Exam Vitals reviewed.  Constitutional:      General: She is not in acute distress. HENT:     Head: Normocephalic.     Comments: Right forehead hematoma Eyes:     Extraocular Movements: Extraocular movements intact.  Cardiovascular:     Rate and Rhythm: Regular rhythm. Tachycardia present.     Pulses: Normal pulses.  Pulmonary:     Effort: Pulmonary effort is normal. No respiratory distress.     Breath sounds: No wheezing or rales.  Abdominal:     General: Bowel sounds are normal. There is distension.     Palpations: Abdomen is soft.     Tenderness: There is no abdominal tenderness. There is no guarding.  Musculoskeletal:        General: No swelling or tenderness.     Cervical back: Normal range of motion.  Skin:    General: Skin is warm and dry.  Neurological:     General: No focal deficit present.     Mental Status: She is  alert and oriented to person, place, and time.     Comments: Tremulous      Labs on Admission: I have personally reviewed following labs and imaging studies  CBC: Recent Labs  Lab 02/25/22 0000 02/27/22 1734  WBC 5.5 4.9  NEUTROABS 4.62 PENDING  HGB 9.1* 9.6*  HCT 30* 31.4*  MCV  --  85.3  PLT 124* 91*   Basic Metabolic Panel: Recent Labs  Lab 02/27/22 1734  NA 134*  K 3.2*  CL 99  CO2 22  GLUCOSE 120*  BUN 7  CREATININE 0.48  CALCIUM 8.6*   GFR: Estimated Creatinine Clearance: 68.2 mL/min (by C-G formula based on SCr of 0.48 mg/dL). Liver Function Tests: Recent Labs  Lab 02/27/22 1734  AST 107*  ALT 43  ALKPHOS 253*  BILITOT 3.5*  PROT 7.2  ALBUMIN 2.7*   Recent Labs  Lab 02/27/22 1734  LIPASE 75*   No results for input(s): "AMMONIA" in the last 168 hours. Coagulation Profile: Recent Labs  Lab 02/27/22 1734  INR 1.3*   Cardiac Enzymes: No results for input(s): "CKTOTAL", "CKMB", "CKMBINDEX", "TROPONINI" in the last 168 hours. BNP (last 3 results) No results for input(s): "PROBNP" in the last 8760 hours. HbA1C: No results for input(s): "HGBA1C" in the last 72 hours. CBG: No results for input(s): "GLUCAP" in the last 168 hours. Lipid Profile: No results for input(s): "CHOL", "HDL", "LDLCALC", "TRIG", "CHOLHDL", "LDLDIRECT" in the last 72 hours. Thyroid Function Tests: No results for input(s): "TSH", "T4TOTAL", "FREET4", "T3FREE", "THYROIDAB" in the last 72 hours. Anemia Panel: No results for input(s): "VITAMINB12", "FOLATE", "FERRITIN", "TIBC", "IRON", "RETICCTPCT" in the last 72 hours. Urine analysis:    Component Value Date/Time   COLORURINE YELLOW 01/22/2022 1815   APPEARANCEUR CLEAR 01/22/2022 1815   LABSPEC 1.004 (L) 01/22/2022 1815   PHURINE 9.0 (H) 01/22/2022 1815   GLUCOSEU NEGATIVE 01/22/2022 1815   HGBUR NEGATIVE 01/22/2022 1815   BILIRUBINUR NEGATIVE 01/22/2022 1815   BILIRUBINUR NEG 05/26/2020 1544   KETONESUR NEGATIVE  01/22/2022 1815   PROTEINUR NEGATIVE 01/22/2022 1815   UROBILINOGEN 1.0 05/26/2020 1544   UROBILINOGEN 0.2 12/02/2017 0836   NITRITE NEGATIVE 01/22/2022 1815   LEUKOCYTESUR NEGATIVE 01/22/2022 1815    Radiological Exams on Admission: I have personally reviewed images CT Head Wo Contrast  Result Date: 02/27/2022 CLINICAL DATA:  Fall last night.  Head trauma EXAM: CT HEAD WITHOUT CONTRAST TECHNIQUE: Contiguous axial images were obtained from the base of the skull through the vertex without intravenous contrast. RADIATION DOSE REDUCTION: This exam was performed according to the departmental dose-optimization program which includes automated exposure control, adjustment of the mA and/or kV according to patient size and/or use of iterative reconstruction technique. COMPARISON:  CT head 12/04/2021 FINDINGS: Brain: Generalized atrophy. Negative for hydrocephalus. Mild periventricular white matter hypodensity. Small calcification left cerebellum unchanged. Negative for acute infarct, hemorrhage, mass Vascular: Negative for hyperdense vessel Skull: Negative Sinuses/Orbits: Negative Other: None IMPRESSION: Generalized atrophy.  No acute abnormality. Electronically Signed   By: Franchot Gallo M.D.   On: 02/27/2022 17:30    EKG: Independently reviewed.  Sinus tachycardia, no acute ischemic changes.  Assessment and Plan  Decompensated alcoholic liver cirrhosis Recurrent ascites -SBP less likely given no fever, abdominal tenderness, leukocytosis, or altered mental status. -IR consulted by ED physician -Ultrasound-guided paracentesis and peritoneal fluid analysis labs ordered -Continue home Lasix and spironolactone.  Mildly elevated lipase -CT ordered to rule out acute pancreatitis given ongoing alcohol abuse.  Abdominal exam limited due to significant abdominal distention/ascites.  Alcohol use disorder, severe, dependence -Patient reports ongoing alcohol use.  Concern for alcohol withdrawal as she has  been tachycardic and tremulous in the ED and last drink was 6/20 a.m. -CIWA protocol; Ativan as needed -Thiamine, folate, multivitamin  Chronic thrombocytopenia -Patient is followed by outpatient hematology  and chronic thrombocytopenia felt to be secondary to both ITP and alcoholic cirrhosis/secondary splenomegaly. On weekly Nplate injections and prednisone. -No signs of active bleeding -Continue prednisone -Monitor platelet level closely  Iron deficiency anemia -Followed by outpatient hematology and being treated with IV iron -Hemoglobin currently stable, continue to monitor  Mild hypokalemia -Monitor potassium and magnesium levels, replenish as needed.  Esophageal varices Patient denies any recent bleeding. -Continue nadolol  Anxiety, depression, schizophrenia -Continue home medications  Seizure disorder -Continue Keppra  GERD -Continue Protonix  DVT prophylaxis: SCDs Code Status: Full Code Family Communication: No family available at this time. Consults called: IR Level of care: Progressive Care Unit Admission status: It is my clinical opinion that referral for OBSERVATION is reasonable and necessary in this patient based on the above information provided. The aforementioned taken together are felt to place the patient at high risk for further clinical deterioration. However, it is anticipated that the patient may be medically stable for discharge from the hospital within 24 to 48 hours.   Shela Leff MD [facility]  If 7PM-7AM, please contact night-coverage [email or web address]  02/27/2022, 9:17 PM

## 2022-02-27 NOTE — Progress Notes (Signed)
Spoke with [facility], set up transportation for DOS: 06/26, 06/28, 07/03, 07/05, 07/10, and 07/12.

## 2022-02-27 NOTE — ED Provider Notes (Signed)
[facility] DEPT Provider Note   CSN: [id number] Arrival date & time: 02/27/22  1631     History  Chief Complaint  Patient presents with   Abdominal Pain    Briana Sweeney is a 51 y.o. 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  Patient reports that her dog tangled up her leash and Briana Sweeney tripped and fell and struck her head yesterday.  Briana Sweeney is having a headache and has bruising around her right eye.  Briana Sweeney also reports that her abdomen is distended again with fluid, and Briana Sweeney feels that Briana Sweeney is needing paracentesis again.  Briana Sweeney states that her last drink of alcohol was yesterday afternoon.  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  Briana Sweeney has been in to feel tremulous but not too bad.  Briana Sweeney drinks about "1 tall beer a day".  Medical record review shows that Briana Sweeney has a history of splenomegaly, ITP.  Briana Sweeney receives chronic weekly platelets, but says Briana Sweeney missed her last appointment.  Briana Sweeney was most recently discharged from the hospital 1 month ago, after Briana Sweeney was being admitted and treated for her ascites, chronic pancytopenia.  Briana Sweeney underwent paracentesis at that time.  Briana Sweeney also had an EGD done with no medical therapy implemented.   HPI     Home Medications Prior to Admission medications   Medication Sig Start Date End Date Taking? Authorizing Provider  ARIPiprazole (ABILIFY) 10 MG tablet Take 1 tablet by mouth every day Patient taking differently: Take 10 mg by mouth every morning. 11/22/21  Yes   clonazePAM (KLONOPIN) 0.5 MG tablet TAKE 1 TABLET BY MOUTH TWICE DAILY Patient taking differently: Take 0.5 mg by mouth 2 (two) times daily as needed for anxiety. 02/14/22  Yes   FLUoxetine (PROZAC) 40 MG capsule Take 1 capsule (40 mg total) by mouth daily. Patient taking differently: Take 40 mg by mouth every morning. 2/59/56  Yes   folic acid  (FOLVITE) 1 MG tablet Take 1 tablet (1 mg total) by mouth daily. 02/03/22 03/05/22 Yes Spongberg, Audie Pinto, MD  furosemide (LASIX) 40 MG tablet Take 40 mg by mouth daily.   Yes [provider]  lactulose (CHRONULAC) 10 GM/15ML solution Take 30 mLs (20 g total) by mouth 3 (three) times daily. 01/24/22  Yes Aline August, MD  levETIRAcetam (KEPPRA) 750 MG tablet Take 1 tablet (750 mg total) by mouth 2 (two) times daily. 07/06/21  Yes Cameron Sprang, MD  nadolol (CORGARD) 20 MG tablet Take 1 tablet (20 mg total) by mouth every morning. 02/03/22 03/05/22 Yes Spongberg, Audie Pinto, MD  OVER THE COUNTER MEDICATION Take 1 capsule by mouth daily. OLLY Ultra brain soft gel   Yes [provider]  pantoprazole (PROTONIX) 40 MG tablet Take 1 tablet (40 mg total) by mouth every morning. 01/24/22  Yes Aline August, MD  potassium chloride (KLOR-CON) 20 MEQ packet Take 1 packet by mouth daily for 14 days. 01/24/22 02/27/22 Yes Aline August, MD  predniSONE (DELTASONE) 20 MG tablet Take 1 tablet (20 mg total) by mouth 2 (two) times daily. 02/02/22 05/03/22 Yes Spongberg, Audie Pinto, MD  spironolactone (ALDACTONE) 50 MG tablet Take 3 tablets (150 mg total) by mouth daily. Patient taking differently: Take 50 mg by mouth daily. 02/03/22 03/05/22 Yes Spongberg, Audie Pinto, MD  ARIPiprazole (ABILIFY) 10 MG tablet Take 1 tablet by mouth every day. Patient not taking: Reported on 02/27/2022  02/14/22     FLUoxetine (PROZAC) 40 MG capsule Take 1 capsule (40 mg total) by mouth daily. Patient not taking: Reported on 02/27/2022 02/14/22     furosemide (LASIX) 20 MG tablet Take 3 tablets (60 mg total) by mouth daily. Patient not taking: Reported on 02/27/2022 02/03/22 03/05/22  Marcell Anger, MD  thiamine 100 MG tablet Take 1 tablet (100 mg total) by mouth daily. Patient not taking: Reported on 02/27/2022 02/03/22 03/05/22  Marcell Anger, MD      Allergies    Other    Review of Systems    Review of Systems  Physical Exam Updated Vital Signs BP 113/84   Pulse (!) 121   Temp 98.5 F (36.9 C) (Oral)   Resp 20   Ht '4\' 11"'$  (1.499 m)   Wt 62.1 kg   LMP  (LMP Unknown)   SpO2 94%   BMI 27.67 kg/m  Physical Exam Constitutional:      General: Briana Sweeney is not in acute distress. HENT:     Head: Normocephalic.     Comments: Ecchymosis of the right temple and right eye Eyes:     Conjunctiva/sclera: Conjunctivae normal.     Pupils: Pupils are equal, round, and reactive to light.  Cardiovascular:     Rate and Rhythm: Normal rate and regular rhythm.  Pulmonary:     Effort: Pulmonary effort is normal. No respiratory distress.  Abdominal:     General: Abdomen is protuberant.     Palpations: Abdomen is soft. There is fluid wave.     Tenderness: There is no abdominal tenderness.  Skin:    General: Skin is warm and dry.  Neurological:     General: No focal deficit present.     Mental Status: Briana Sweeney is alert. Mental status is at baseline.  Psychiatric:        Mood and Affect: Mood normal.        Behavior: Behavior normal.     ED Results / Procedures / Treatments   Labs (all labs ordered are listed, but only abnormal results are displayed) Labs Reviewed  COMPREHENSIVE METABOLIC PANEL - Abnormal; Notable for the following components:      Result Value   Sodium 134 (*)    Potassium 3.2 (*)    Glucose, Bld 120 (*)    Calcium 8.6 (*)    Albumin 2.7 (*)    AST 107 (*)    Alkaline Phosphatase 253 (*)    Total Bilirubin 3.5 (*)    All other components within normal limits  CBC WITH DIFFERENTIAL/PLATELET - Abnormal; Notable for the following components:   RBC 3.68 (*)    Hemoglobin 9.6 (*)    HCT 31.4 (*)    RDW 28.0 (*)    Platelets 91 (*)    Lymphs Abs 0.2 (*)    All other components within normal limits  PROTIME-INR - Abnormal; Notable for the following components:   Prothrombin Time 15.9 (*)    INR 1.3 (*)    All other components within normal limits  LIPASE, BLOOD  - Abnormal; Notable for the following components:   Lipase 75 (*)    All other components within normal limits  CBC  PROTIME-INR  COMPREHENSIVE METABOLIC PANEL  MAGNESIUM  I-STAT BETA HCG BLOOD, ED ([facility], [facility], [facility] ONLY)    EKG None  Radiology CT Head Wo Contrast  Result Date: 02/27/2022 CLINICAL DATA:  Fall last night.  Head trauma EXAM: CT HEAD WITHOUT CONTRAST TECHNIQUE: Contiguous  axial images were obtained from the base of the skull through the vertex without intravenous contrast. RADIATION DOSE REDUCTION: This exam was performed according to the departmental dose-optimization program which includes automated exposure control, adjustment of the mA and/or kV according to patient size and/or use of iterative reconstruction technique. COMPARISON:  CT head 12/04/2021 FINDINGS: Brain: Generalized atrophy. Negative for hydrocephalus. Mild periventricular white matter hypodensity. Small calcification left cerebellum unchanged. Negative for acute infarct, hemorrhage, mass Vascular: Negative for hyperdense vessel Skull: Negative Sinuses/Orbits: Negative Other: None IMPRESSION: Generalized atrophy.  No acute abnormality. Electronically Signed   By: Franchot Gallo M.D.   On: 02/27/2022 17:30    Procedures Procedures    Medications Ordered in ED Medications  furosemide (LASIX) tablet 40 mg (has no administration in time range)  nadolol (CORGARD) tablet 20 mg (has no administration in time range)  spironolactone (ALDACTONE) tablet 50 mg (has no administration in time range)  ARIPiprazole (ABILIFY) tablet 10 mg (has no administration in time range)  FLUoxetine (PROZAC) capsule 40 mg (has no administration in time range)  predniSONE (DELTASONE) tablet 20 mg (20 mg Oral Given 02/27/22 2316)  pantoprazole (PROTONIX) EC tablet 40 mg (has no administration in time range)  levETIRAcetam (KEPPRA) tablet 750 mg (750 mg Oral Given 02/27/22 2316)  LORazepam (ATIVAN) tablet 1-4 mg (has no administration in time  range)    Or  LORazepam (ATIVAN) injection 1-4 mg (has no administration in time range)  folic acid (FOLVITE) tablet 1 mg (has no administration in time range)  multivitamin with minerals tablet 1 tablet (has no administration in time range)  thiamine tablet 100 mg (has no administration in time range)    Or  thiamine (B-1) injection 100 mg (has no administration in time range)  lactulose (CHRONULAC) 10 GM/15ML solution 20 g (has no administration in time range)  LORazepam (ATIVAN) injection 1 mg (1 mg Intravenous Given 02/27/22 1739)  ketorolac (TORADOL) 15 MG/ML injection 15 mg (15 mg Intravenous Given 02/27/22 2011)  metoCLOPramide (REGLAN) injection 10 mg (10 mg Intravenous Given 02/27/22 2010)  potassium chloride SA (KLOR-CON M) CR tablet 40 mEq (40 mEq Oral Given 02/27/22 2316)    ED Course/ Medical Decision Making/ A&P Clinical Course as of 02/27/22 2342  Wed Feb 27, 2022  1917 Patient's labs overall are stable including her platelet count.  Given her degree of discomfort and dyspnea, Briana Sweeney will be admitted for paracentesis in the morning.  I discussed the case Dr Ronny Bacon from IR who notified the daytime team performed a paracentesis.  Briana Sweeney may benefit from albumin infusion at the same time.  We will discuss with the hospitalist. [MT]  2113 Admitted to hospitalist Dr Marlowe Sax [MT]    Clinical Course User Index [MT] Langston Masker Carola Rhine, MD                           Medical Decision Making Amount and/or Complexity of Data Reviewed Labs: ordered. Radiology: ordered.  Risk Prescription drug management. Decision regarding hospitalization.   This patient presents to the ED with concern for head injury in the setting of thrombocytopenia, high risk for potential brain bleed.  Briana Sweeney also has recurring ascites of the abdomen.. This involves an extensive number of treatment options, and is a complaint that carries with it a high risk of complications and morbidity.    Her abdominal exam  overall does not show focal tenderness suggest SBP.  Briana Sweeney has a distended abdomen and  a fluid wave.  Do not feel Briana Sweeney needs emergent repeat CT imaging of the abdomen at this time.  Briana Sweeney reports of difficulty urinating in the past day, but Briana Sweeney did have a small amount of urination earlier today.  We will have her attempt to urinate again, may need to catheterize if Briana Sweeney cannot fully void her bladder.  Co-morbidities that complicate the patient evaluation: Alcoholic cirrhosis, continue drinking of alcohol  Briana Sweeney is tachycardic on arrival.  Feels somewhat tremulous from not drinking.  Will start on CIWA protocol  External records from outside source obtained and reviewed including hospital discharge summary and course from last month as noted above  I ordered and personally interpreted labs.  The pertinent results include: Labs at baseline level, chronic thrombocytopenia, chronic hypoalbuminemia  I ordered imaging studies including CT scan of the head for head injury I independently visualized and interpreted imaging which showed no acute intracranial injury I agree with the radiologist interpretation  The patient was maintained on a cardiac monitor.  I personally viewed and interpreted the cardiac monitored which showed an underlying rhythm of: Sinus tachycardia  I ordered medication including Ativan as needed per CIWA protocol for alcohol withdrawal, IV Toradol and Reglan for I have reviewed the patients home medicines and have made adjustments as needed   After the interventions noted above, I reevaluated the patient and found that they have: stayed the same  Social Determinants of Health:Discussed concerns for chronic continued alcohol use with alcoholic cirrhosis  Dispostion:  After consideration of the diagnostic results and the patients response to treatment, I feel that the patent would benefit from medical admission for therapeutic paracentesis of ascites, monitoring for alcohol withdrawal  overnight.         Final Clinical Impression(s) / ED Diagnoses Final diagnoses:  Ascites due to alcoholic cirrhosis [facility])    Rx / DC Orders ED Discharge Orders     None         Korin Setzler, Carola Rhine, MD 02/27/22 2342

## 2022-02-27 NOTE — ED Triage Notes (Signed)
Ems brings pt in for abdominal pain. Pt reports abdominal distention. Pt also reports a trip and fall last night after drinking alcohol.

## 2022-02-28 ENCOUNTER — Inpatient Hospital Stay (HOSPITAL_COMMUNITY): Payer: Medicaid Other

## 2022-02-28 ENCOUNTER — Other Ambulatory Visit: Payer: Self-pay

## 2022-02-28 ENCOUNTER — Other Ambulatory Visit (HOSPITAL_COMMUNITY): Payer: Medicaid Other

## 2022-02-28 ENCOUNTER — Observation Stay (HOSPITAL_COMMUNITY): Payer: Medicaid Other

## 2022-02-28 DIAGNOSIS — F209 Schizophrenia, unspecified: Secondary | ICD-10-CM | POA: Diagnosis present

## 2022-02-28 DIAGNOSIS — D509 Iron deficiency anemia, unspecified: Secondary | ICD-10-CM | POA: Diagnosis present

## 2022-02-28 DIAGNOSIS — W010XXA Fall on same level from slipping, tripping and stumbling without subsequent striking against object, initial encounter: Secondary | ICD-10-CM | POA: Diagnosis present

## 2022-02-28 DIAGNOSIS — F319 Bipolar disorder, unspecified: Secondary | ICD-10-CM | POA: Diagnosis present

## 2022-02-28 DIAGNOSIS — D61818 Other pancytopenia: Secondary | ICD-10-CM | POA: Diagnosis present

## 2022-02-28 DIAGNOSIS — Z7952 Long term (current) use of systemic steroids: Secondary | ICD-10-CM | POA: Diagnosis not present

## 2022-02-28 DIAGNOSIS — Z79899 Other long term (current) drug therapy: Secondary | ICD-10-CM | POA: Diagnosis not present

## 2022-02-28 DIAGNOSIS — G40909 Epilepsy, unspecified, not intractable, without status epilepticus: Secondary | ICD-10-CM | POA: Diagnosis present

## 2022-02-28 DIAGNOSIS — S0511XA Contusion of eyeball and orbital tissues, right eye, initial encounter: Secondary | ICD-10-CM | POA: Diagnosis present

## 2022-02-28 DIAGNOSIS — F419 Anxiety disorder, unspecified: Secondary | ICD-10-CM | POA: Diagnosis present

## 2022-02-28 DIAGNOSIS — K7031 Alcoholic cirrhosis of liver with ascites: Secondary | ICD-10-CM | POA: Diagnosis present

## 2022-02-28 DIAGNOSIS — Z888 Allergy status to other drugs, medicaments and biological substances status: Secondary | ICD-10-CM | POA: Diagnosis not present

## 2022-02-28 DIAGNOSIS — F10239 Alcohol dependence with withdrawal, unspecified: Secondary | ICD-10-CM | POA: Diagnosis present

## 2022-02-28 DIAGNOSIS — E8809 Other disorders of plasma-protein metabolism, not elsewhere classified: Secondary | ICD-10-CM | POA: Diagnosis present

## 2022-02-28 DIAGNOSIS — A0472 Enterocolitis due to Clostridium difficile, not specified as recurrent: Secondary | ICD-10-CM | POA: Diagnosis present

## 2022-02-28 DIAGNOSIS — S0990XA Unspecified injury of head, initial encounter: Secondary | ICD-10-CM | POA: Diagnosis present

## 2022-02-28 DIAGNOSIS — D693 Immune thrombocytopenic purpura: Secondary | ICD-10-CM | POA: Diagnosis present

## 2022-02-28 DIAGNOSIS — K219 Gastro-esophageal reflux disease without esophagitis: Secondary | ICD-10-CM | POA: Diagnosis present

## 2022-02-28 DIAGNOSIS — K7030 Alcoholic cirrhosis of liver without ascites: Secondary | ICD-10-CM | POA: Diagnosis present

## 2022-02-28 DIAGNOSIS — Z87442 Personal history of urinary calculi: Secondary | ICD-10-CM | POA: Diagnosis not present

## 2022-02-28 DIAGNOSIS — I8510 Secondary esophageal varices without bleeding: Secondary | ICD-10-CM | POA: Diagnosis present

## 2022-02-28 DIAGNOSIS — R1900 Intra-abdominal and pelvic swelling, mass and lump, unspecified site: Secondary | ICD-10-CM | POA: Diagnosis present

## 2022-02-28 DIAGNOSIS — R161 Splenomegaly, not elsewhere classified: Secondary | ICD-10-CM | POA: Diagnosis present

## 2022-02-28 DIAGNOSIS — B182 Chronic viral hepatitis C: Secondary | ICD-10-CM | POA: Diagnosis present

## 2022-02-28 DIAGNOSIS — Z9851 Tubal ligation status: Secondary | ICD-10-CM | POA: Diagnosis not present

## 2022-02-28 DIAGNOSIS — E876 Hypokalemia: Secondary | ICD-10-CM | POA: Diagnosis present

## 2022-02-28 DIAGNOSIS — F10939 Alcohol use, unspecified with withdrawal, unspecified: Secondary | ICD-10-CM | POA: Diagnosis present

## 2022-02-28 LAB — COMPREHENSIVE METABOLIC PANEL
ALT: 38 U/L (ref 0–44)
AST: 92 U/L — ABNORMAL HIGH (ref 15–41)
Albumin: 2.5 g/dL — ABNORMAL LOW (ref 3.5–5.0)
Alkaline Phosphatase: 203 U/L — ABNORMAL HIGH (ref 38–126)
Anion gap: 11 (ref 5–15)
BUN: 8 mg/dL (ref 6–20)
CO2: 23 mmol/L (ref 22–32)
Calcium: 8.4 mg/dL — ABNORMAL LOW (ref 8.9–10.3)
Chloride: 103 mmol/L (ref 98–111)
Creatinine, Ser: 0.54 mg/dL (ref 0.44–1.00)
GFR, Estimated: >60 mL/min (ref >60)
Glucose, Bld: 105 mg/dL — ABNORMAL HIGH (ref 70–99)
Potassium: 3.7 mmol/L (ref 3.5–5.1)
Sodium: 137 mmol/L (ref 135–145)
Total Bilirubin: 3.9 mg/dL — ABNORMAL HIGH (ref 0.3–1.2)
Total Protein: 6.7 g/dL (ref 6.5–8.1)

## 2022-02-28 LAB — BODY FLUID CELL COUNT WITH DIFFERENTIAL
Eos, Fluid: 0 %
Lymphs, Fluid: 36 %
Monocyte-Macrophage-Serous Fluid: 62 % (ref 50–90)
Neutrophil Count, Fluid: 2 % (ref 0–25)
Total Nucleated Cell Count, Fluid: 110 cu mm (ref 0–1000)

## 2022-02-28 LAB — GRAM STAIN

## 2022-02-28 LAB — MAGNESIUM: Magnesium: 1.7 mg/dL (ref 1.7–2.4)

## 2022-02-28 LAB — MRSA NEXT GEN BY PCR, NASAL: MRSA by PCR Next Gen: DETECTED — AB

## 2022-02-28 LAB — C DIFFICILE QUICK SCREEN W PCR REFLEX
C Diff antigen: POSITIVE — AB
C Diff toxin: NEGATIVE

## 2022-02-28 LAB — GLUCOSE, CAPILLARY
Glucose-Capillary: 164 mg/dL — ABNORMAL HIGH (ref 70–99)
Glucose-Capillary: 113 mg/dL — ABNORMAL HIGH (ref 70–99)

## 2022-02-28 LAB — GLUCOSE, PLEURAL OR PERITONEAL FLUID: Glucose, Fluid: 99 mg/dL

## 2022-02-28 LAB — CBC
HCT: 30.2 % — ABNORMAL LOW (ref 36.0–46.0)
Hemoglobin: 9.1 g/dL — ABNORMAL LOW (ref 12.0–15.0)
MCH: 26.4 pg (ref 26.0–34.0)
MCHC: 30.1 g/dL (ref 30.0–36.0)
MCV: 87.5 fL (ref 80.0–100.0)
Platelets: 71 10*3/uL — ABNORMAL LOW (ref 150–400)
RBC: 3.45 MIL/uL — ABNORMAL LOW (ref 3.87–5.11)
RDW: 28.4 % — ABNORMAL HIGH (ref 11.5–15.5)
WBC: 5 10*3/uL (ref 4.0–10.5)
nRBC: 0 % (ref 0.0–0.2)

## 2022-02-28 LAB — PROTEIN, PLEURAL OR PERITONEAL FLUID: Total protein, fluid: <3 g/dL

## 2022-02-28 LAB — PROTIME-INR
INR: 1.3 — ABNORMAL HIGH (ref 0.8–1.2)
Prothrombin Time: 16.2 seconds — ABNORMAL HIGH (ref 11.4–15.2)

## 2022-02-28 LAB — LACTATE DEHYDROGENASE, PLEURAL OR PERITONEAL FLUID: LD, Fluid: 40 U/L — ABNORMAL HIGH (ref 3–23)

## 2022-02-28 LAB — ALBUMIN, PLEURAL OR PERITONEAL FLUID: Albumin, Fluid: <1.5 g/dL

## 2022-02-28 LAB — PROCALCITONIN: Procalcitonin: <0.1 ng/mL

## 2022-02-28 LAB — CLOSTRIDIUM DIFFICILE BY PCR, REFLEXED: Toxigenic C. Difficile by PCR: POSITIVE — AB

## 2022-02-28 MED ORDER — MUPIROCIN 2 % EX OINT
1.0000 | TOPICAL_OINTMENT | Freq: Two times a day (BID) | CUTANEOUS | Status: DC
  Administered 2022-02-28 – 2022-03-03 (×6): 1 via NASAL
  Filled 2022-02-28: qty 22

## 2022-02-28 MED ORDER — OXYCODONE HCL 5 MG PO TABS
5.0000 mg | ORAL_TABLET | Freq: Once | ORAL | Status: AC
  Administered 2022-02-28: 5 mg via ORAL
  Filled 2022-02-28: qty 1

## 2022-02-28 MED ORDER — CHLORHEXIDINE GLUCONATE CLOTH 2 % EX PADS
6.0000 | MEDICATED_PAD | Freq: Every day | CUTANEOUS | Status: DC
  Administered 2022-02-28 – 2022-03-03 (×4): 6 via TOPICAL

## 2022-02-28 MED ORDER — LIDOCAINE HCL 1 % IJ SOLN
INTRAMUSCULAR | Status: AC
  Administered 2022-02-28: 15 mL
  Filled 2022-02-28: qty 20

## 2022-02-28 MED ORDER — CHLORDIAZEPOXIDE HCL 25 MG PO CAPS
25.0000 mg | ORAL_CAPSULE | Freq: Three times a day (TID) | ORAL | Status: AC
Start: 2022-02-28 — End: 2022-03-01
  Administered 2022-02-28 – 2022-03-01 (×6): 25 mg via ORAL
  Filled 2022-02-28 (×6): qty 1

## 2022-02-28 MED ORDER — ORAL CARE MOUTH RINSE: 15.0000 mL | OROMUCOSAL | Status: DC | PRN

## 2022-02-28 MED ORDER — PIPERACILLIN-TAZOBACTAM 3.375 G IVPB
3.3750 g | Freq: Three times a day (TID) | INTRAVENOUS | Status: DC
  Administered 2022-02-28 – 2022-03-01 (×3): 3.375 g via INTRAVENOUS
  Filled 2022-02-28 (×3): qty 50

## 2022-02-28 NOTE — Progress Notes (Signed)
PROGRESS NOTE    Briana Sweeney  HGD:[id number] DOB: 1971-06-16 DOA: 02/27/2022 PCP: Marco Collie, MD   Brief Narrative:  Briana Sweeney is a 51 y.o. 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  Recently admitted 5/16-5/18 for ascites and underwent paracentesis on 5/16 with removal of 4 L peritoneal fluid.  Admitted again 5/24-5/27 for ascites and underwent paracentesis on 5/25 with removal of 3.6 L peritoneal fluid.  EGD done 5/26 showing tiny hiatal hernia, grade 1 esophageal varices, and portal hypertensive gastropathy.  GI had recommended 40 mg of prednisone for 1 month in case some of her liver disease was due to alcoholic hepatitis based on her [place] discriminatory function.   Patient presented to the ED today complaining of abdominal distention.  Also reported a mechanical fall at home yesterday where she struck her head.  Patient was tachycardic and tremulous in the ED.  Labs showing WBC 4.9, hemoglobin 9.6 (stable), platelet count 91.  Sodium 134, potassium 3.2, chloride 99, bicarb 22, BUN 7, creatinine 0.4, glucose 120, calcium 8.6, albumin 2.7, AST 107, ALT 43, alk phos 253, T. bili 3.5, lipase 75, PT 15.9, INR 1.3.  Beta-hCG negative.  CT head negative for acute finding. Patient was given Ativan per CIWA protocol.  Also given Toradol, Reglan, and thiamine.  ED physician discussed the case with Dr. Pascal Lux with IR and ultrasound-guided paracentesis ordered.   Patient states she was walking her dog yesterday evening and fell as her dog pulled the leash.  She struck her head and felt dizzy afterwards but denies loss of consciousness.  She does not take any blood thinners.  She is also reporting recurrent abdominal distention and discomfort for which she  has undergone paracentesis in the past.  She is taking Lasix 40 mg daily and spironolactone 50 mg daily.  Denies fevers, nausea, or vomiting.  She is having regular bowel movements.  She continues to drink 24 ounces of beer daily.  No other complaints.  Assessment & Plan:   Principal Problem:   Alcoholic cirrhosis of liver with ascites ([place]) Active Problems:   Alcohol use disorder, severe, dependence (HCC)   Esophageal varices without bleeding (HCC)   Thrombocytopenia (HCC)   Seizure disorder (HCC)   Depression   Alcohol withdrawal (HCC)   ALC (alcoholic liver cirrhosis) (HCC)  Decompensated alcoholic liver cirrhosis/recurrent ascites: After further questioning in detail, she is endorsing abdominal pain.  Although no fever.  SBP cannot be ruled out completely.  IR is consulted for therapeutic as well as diagnostic paracentesis. Continue home Lasix and spironolactone.   Mildly elevated lipase: Lipase not high enough to qualify for pancreatitis and CT is negative for pancreatitis as well.  Alcohol use disorder, severe, dependence/acute alcohol withdrawal: She tells me that she drinks about 2 large cans of 42 ounces beer daily, starts from the morning until night.  Her last drink was 2 nights ago.  She is currently shaking and has headache.  Clearly she is withdrawing from alcohol.  I am afraid that she will have severe alcohol withdrawal and thus I will start her on Librium 25 mg 3 times daily and continue CIWA protocol with as needed Ativan and multivitamin.  Possible acute colitis: CT abdomen shows possibility of acute colitis and she is complaining of having diarrhea since yesterday as well as abdominal pain.  Although  she is not tender on the abdominal examination.  I will start her on IV Zosyn and will check for C. difficile.   Chronic thrombocytopenia -Patient is followed by outpatient hematology and chronic thrombocytopenia felt to be secondary to both ITP and alcoholic  cirrhosis/secondary splenomegaly. On weekly Nplate injections and prednisone. -No signs of active bleeding -Continue prednisone -Monitor platelet level closely   Iron deficiency anemia -Followed by outpatient hematology and being treated with IV iron -Hemoglobin currently stable, continue to monitor  Hypokalemia: Resolved.   Esophageal varices Patient denies any recent bleeding. -Continue nadolol   Anxiety, depression, schizophrenia -Continue home medications   Seizure disorder -Continue Keppra   GERD -Continue Protonix  DVT prophylaxis: SCDs Start: 02/27/22 2211   Code Status: Full Code  Family Communication:  None present at bedside.  Plan of care discussed with patient in length and he/she verbalized understanding and agreed with it.  Status is: Observation The patient will require care spanning > 2 midnights and should be moved to inpatient because: Paracentesis pending as well as she is going through alcohol withdrawal.   Estimated body mass index is 27.67 kg/m as calculated from the following:   Height as of this encounter: $RemoveBeforeD'4\' 11"'wrjDdGnizrNBpX$  (1.499 m).   Weight as of this encounter: 62.1 kg.    Nutritional Assessment: Body mass index is 27.67 kg/m.[name] Kitchen Seen by dietician.  I agree with the assessment and plan as outlined below: Nutrition Status:        . Skin Assessment: I have examined the patient's skin and I agree with the wound assessment as performed by the wound care RN as outlined below:    Consultants:  None  Procedures:  As above  Antimicrobials:  Anti-infectives (From admission, onward)    Start     Dose/Rate Route Frequency Ordered Stop   02/28/22 1145  piperacillin-tazobactam (ZOSYN) IVPB 3.375 g        3.375 g 12.5 mL/hr over 240 Minutes Intravenous Every 8 hours 02/28/22 1059           Subjective: Patient seen and examined.  She is complaining of tremors, headache and anxiety.  Also some abdominal pain and distention.  No other complaint.   Had diarrhea yesterday.  Objective: Vitals:   02/28/22 0630 02/28/22 0730 02/28/22 0800 02/28/22 0900  BP: (!) 133/94 113/84 121/84 113/83  Pulse: (!) 111 (!) 113 (!) 117 (!) 120  Resp: 20 (!) 21 19 (!) 22  Temp:      TempSrc:      SpO2: 96% 99% 98% 100%  Weight:      Height:       No intake or output data in the 24 hours ending 02/28/22 1111 Filed Weights   02/27/22 1640  Weight: 62.1 kg    Examination:  General exam: Appears anxious with tremors. Respiratory system: Clear to auscultation. Respiratory effort normal. Cardiovascular system: S1 & S2 heard, RRR. No JVD, murmurs, rubs, gallops or clicks. No pedal edema. Gastrointestinal system: Abdomen is soft, severely distended. No organomegaly or masses felt. Normal bowel sounds heard. Central nervous system: Alert and oriented. No focal neurological deficits. Extremities: Symmetric 5 x 5 power. Skin: No rashes, lesions or ulcers  Data Reviewed: I have personally reviewed following labs and imaging studies  CBC: Recent Labs  Lab 02/25/22 0000 02/27/22 1734 02/28/22 0500  WBC 5.5 4.9 5.0  NEUTROABS 4.62 4.3  --   HGB 9.1* 9.6* 9.1*  HCT 30* 31.4* 30.2*  MCV  --  85.3  87.5  PLT 124* 91* 71*   Basic Metabolic Panel: Recent Labs  Lab 02/27/22 1734 02/28/22 0500  NA 134* 137  K 3.2* 3.7  CL 99 103  CO2 22 23  GLUCOSE 120* 105*  BUN 7 8  CREATININE 0.48 0.54  CALCIUM 8.6* 8.4*  MG  --  1.7   GFR: Estimated Creatinine Clearance: 68.2 mL/min (by C-G formula based on SCr of 0.54 mg/dL). Liver Function Tests: Recent Labs  Lab 02/27/22 1734 02/28/22 0500  AST 107* 92*  ALT 43 38  ALKPHOS 253* 203*  BILITOT 3.5* 3.9*  PROT 7.2 6.7  ALBUMIN 2.7* 2.5*   Recent Labs  Lab 02/27/22 1734  LIPASE 75*   No results for input(s): "AMMONIA" in the last 168 hours. Coagulation Profile: Recent Labs  Lab 02/27/22 1734 02/28/22 0500  INR 1.3* 1.3*   Cardiac Enzymes: No results for input(s): "CKTOTAL",  "CKMB", "CKMBINDEX", "TROPONINI" in the last 168 hours. BNP (last 3 results) No results for input(s): "PROBNP" in the last 8760 hours. HbA1C: No results for input(s): "HGBA1C" in the last 72 hours. CBG: No results for input(s): "GLUCAP" in the last 168 hours. Lipid Profile: No results for input(s): "CHOL", "HDL", "LDLCALC", "TRIG", "CHOLHDL", "LDLDIRECT" in the last 72 hours. Thyroid Function Tests: No results for input(s): "TSH", "T4TOTAL", "FREET4", "T3FREE", "THYROIDAB" in the last 72 hours. Anemia Panel: No results for input(s): "VITAMINB12", "FOLATE", "FERRITIN", "TIBC", "IRON", "RETICCTPCT" in the last 72 hours. Sepsis Labs: No results for input(s): "PROCALCITON", "LATICACIDVEN" in the last 168 hours.  No results found for this or any previous visit (from the past 240 hour(s)).   Radiology Studies: CT ABDOMEN PELVIS WO CONTRAST  Result Date: 02/28/2022 CLINICAL DATA:  Pancreatitis suspected. Abdominal swelling, cirrhosis, multiple paracentesis. EXAM: CT ABDOMEN AND PELVIS WITHOUT CONTRAST TECHNIQUE: Multidetector CT imaging of the abdomen and pelvis was performed following the standard protocol without IV contrast. RADIATION DOSE REDUCTION: This exam was performed according to the departmental dose-optimization program which includes automated exposure control, adjustment of the mA and/or kV according to patient size and/or use of iterative reconstruction technique. COMPARISON:  01/30/2022. FINDINGS: Lower chest: Calcified lymph nodes are present at the left hilum. The heart is normal in size and there is no pericardial effusion. Atelectasis is noted at the lung bases. Hepatobiliary: The liver has an irregular contour, compatible with underlying cirrhosis. Fatty infiltration is noted and there is heterogeneous attenuation. A stone is present in the gallbladder. No biliary ductal dilatation. Pancreas: Calcifications are noted in the head of the pancreas. No pancreatic ductal dilatation or  pseudocyst. Spleen: Normal in size without focal abnormality. Adrenals/Urinary Tract: No adrenal nodule or mass. No renal calculus or hydronephrosis. The bladder is unremarkable. Stomach/Bowel: There is thickening of the walls of the distal esophagus with multiple paraesophageal varices. Possible small hiatal hernia. There is diffuse gastric wall thickening. Appendix appears normal. No bowel obstruction is seen. No free air or pneumatosis. There is colonic wall thickening involving the cecum, ascending, transverse, and descending colon, worsening from the prior exam. Vascular/Lymphatic: Aortic atherosclerosis. No enlarged abdominal or pelvic lymph nodes. Reproductive: Uterus and bilateral adnexa are unremarkable. Other: Moderate-to-large ascites. Musculoskeletal: No acute osseous abnormality. IMPRESSION: 1. Interval worsening of colonic wall thickening, most pronounced at the ascending colon suggesting colitis. 2. Thickening of the walls of the distal esophagus and gastric mucosa, possible esophagitis/gastritis. 3. Moderate-to-large ascites. 4. Morphologic changes of cirrhosis and portal hypertension. 5. Aortic atherosclerosis. Electronically Signed   By: Regan Rakers.D.  On: 02/28/2022 02:27   CT Head Wo Contrast  Result Date: 02/27/2022 CLINICAL DATA:  Fall last night.  Head trauma EXAM: CT HEAD WITHOUT CONTRAST TECHNIQUE: Contiguous axial images were obtained from the base of the skull through the vertex without intravenous contrast. RADIATION DOSE REDUCTION: This exam was performed according to the departmental dose-optimization program which includes automated exposure control, adjustment of the mA and/or kV according to patient size and/or use of iterative reconstruction technique. COMPARISON:  CT head 12/04/2021 FINDINGS: Brain: Generalized atrophy. Negative for hydrocephalus. Mild periventricular white matter hypodensity. Small calcification left cerebellum unchanged. Negative for acute infarct,  hemorrhage, mass Vascular: Negative for hyperdense vessel Skull: Negative Sinuses/Orbits: Negative Other: None IMPRESSION: Generalized atrophy.  No acute abnormality. Electronically Signed   By: Franchot Gallo M.D.   On: 02/27/2022 17:30    Scheduled Meds:  ARIPiprazole  10 mg Oral q morning   chlordiazePOXIDE  25 mg Oral TID   Chlorhexidine Gluconate Cloth  6 each Topical Daily   FLUoxetine  40 mg Oral q morning   folic acid  1 mg Oral Daily   furosemide  40 mg Oral Daily   lactulose  20 g Oral TID   levETIRAcetam  750 mg Oral BID   lidocaine       multivitamin with minerals  1 tablet Oral Daily   nadolol  20 mg Oral q morning   pantoprazole  40 mg Oral q morning   predniSONE  20 mg Oral BID   spironolactone  50 mg Oral Daily   thiamine  100 mg Oral Daily   Or   thiamine  100 mg Intravenous Daily   Continuous Infusions:  piperacillin-tazobactam (ZOSYN)  IV       LOS: 0 days   Darliss Cheney, MD [facility]  02/28/2022, 11:11 AM   *Please note that this is a verbal dictation therefore any spelling or grammatical errors are due to the "[place] [employer]" system interpretation.  Please page via [place] and do not message via secure chat for urgent patient care matters. Secure chat can be used for non urgent patient care matters.  How to contact the [facility] Attending or Consulting provider [place] or covering provider during after hours [place], for this patient?  Check the care team in [facility] and look for a) attending/consulting [facility] provider listed and b) the [facility] team listed. Page or secure chat [phone]. Log into [email or web address] and use [place]'s universal password to access. If you do not have the password, please contact the hospital operator. Locate the [facility] provider you are looking for under [facility] and page to a number that you can be directly reached. If you still have difficulty reaching the provider, please page the [facility] (Director on Call) for the Hospitalists listed  on amion for assistance.

## 2022-02-28 NOTE — Progress Notes (Signed)
Pharmacy Antibiotic Note  Briana Sweeney is a 51 y.o. female admitted on 02/27/2022 with possible acute colitis. Patient with decompensated alcoholic liver cirrhosis/recurrent ascites; pending IR consult for paracentesis. Pharmacy has been consulted for Zosyn dosing for intra-abdominal infection.  Plan: Zosyn 3.375 g IV q8h extended infusion  Pharmacy to sign off consult but will continue to follow renal function, cultures and clinical progress for antibiotic dosage adjustments and de-escalation as indicated  Height: '4\' 11"'$  (355.7 cm) Weight: 62.1 kg (137 lb) IBW/kg (Calculated) : 43.2  Temp (24hrs), Avg:98.5 F (36.9 C), Min:98.5 F (36.9 C), Max:98.5 F (36.9 C)  Recent Labs  Lab 02/25/22 0000 02/27/22 1734 02/28/22 0500  WBC 5.5 4.9 5.0  CREATININE  --  0.48 0.54    Estimated Creatinine Clearance: 68.2 mL/min (by C-G formula based on SCr of 0.54 mg/dL).    Allergies  Allergen Reactions   Other Other (See Comments)    Platelets: Rx chest pain, tremors, body aches    Antimicrobials this admission: Zosyn 6/22 >>   Microbiology results: 6/22 C.diff: pending collection 6/22 MRSA PCR: pending  Thank you for allowing pharmacy to be a part of this patient's care.  Tawnya Crook, PharmD, BCPS Clinical Pharmacist 02/28/2022 11:22 AM

## 2022-02-28 NOTE — ED Notes (Signed)
Rounded on pt. Pt is awake and tachycardic. Pt is complaining of headache and skin itching/crawling. CIWAA is performed and scored at 39. Will give correct dose of meds based on score.

## 2022-02-28 NOTE — Procedures (Signed)
PROCEDURE SUMMARY:  Successful ultrasound guided diagnostic and therapeutic paracentesis from the LLQ. Yielded 3.4 L of clear, yellow fluid.  No immediate complications.  The patient tolerated the procedure well.   Specimen was sent for labs.  EBL < 109m  The patient has previously been [facility] and is being actively followed for potential future intervention.     SNarda Rutherford AGNP-BC 02/28/2022, 1:20 PM

## 2022-03-01 ENCOUNTER — Other Ambulatory Visit (HOSPITAL_COMMUNITY): Payer: Self-pay

## 2022-03-01 ENCOUNTER — Encounter: Payer: Self-pay | Admitting: Oncology

## 2022-03-01 DIAGNOSIS — A0472 Enterocolitis due to Clostridium difficile, not specified as recurrent: Secondary | ICD-10-CM

## 2022-03-01 DIAGNOSIS — K7031 Alcoholic cirrhosis of liver with ascites: Secondary | ICD-10-CM | POA: Diagnosis not present

## 2022-03-01 LAB — COMPREHENSIVE METABOLIC PANEL
ALT: 38 U/L (ref 0–44)
AST: 103 U/L — ABNORMAL HIGH (ref 15–41)
Albumin: 2.4 g/dL — ABNORMAL LOW (ref 3.5–5.0)
Alkaline Phosphatase: 220 U/L — ABNORMAL HIGH (ref 38–126)
Anion gap: 9 (ref 5–15)
BUN: 7 mg/dL (ref 6–20)
CO2: 23 mmol/L (ref 22–32)
Calcium: 8 mg/dL — ABNORMAL LOW (ref 8.9–10.3)
Chloride: 100 mmol/L (ref 98–111)
Creatinine, Ser: 0.57 mg/dL (ref 0.44–1.00)
GFR, Estimated: >60 mL/min (ref >60)
Glucose, Bld: 127 mg/dL — ABNORMAL HIGH (ref 70–99)
Potassium: 3.2 mmol/L — ABNORMAL LOW (ref 3.5–5.1)
Sodium: 132 mmol/L — ABNORMAL LOW (ref 135–145)
Total Bilirubin: 3.7 mg/dL — ABNORMAL HIGH (ref 0.3–1.2)
Total Protein: 6.2 g/dL — ABNORMAL LOW (ref 6.5–8.1)

## 2022-03-01 LAB — MAGNESIUM: Magnesium: 1.7 mg/dL (ref 1.7–2.4)

## 2022-03-01 LAB — CBC WITH DIFFERENTIAL/PLATELET
Abs Immature Granulocytes: 0.02 10*3/uL (ref 0.00–0.07)
Basophils Absolute: 0 10*3/uL (ref 0.0–0.1)
Basophils Relative: 0 %
Eosinophils Absolute: 0 10*3/uL (ref 0.0–0.5)
Eosinophils Relative: 1 %
HCT: 32.5 % — ABNORMAL LOW (ref 36.0–46.0)
Hemoglobin: 9.4 g/dL — ABNORMAL LOW (ref 12.0–15.0)
Immature Granulocytes: 1 %
Lymphocytes Relative: 3 %
Lymphs Abs: 0.1 10*3/uL — ABNORMAL LOW (ref 0.7–4.0)
MCH: 26.3 pg (ref 26.0–34.0)
MCHC: 28.9 g/dL — ABNORMAL LOW (ref 30.0–36.0)
MCV: 91 fL (ref 80.0–100.0)
Monocytes Absolute: 0.3 10*3/uL (ref 0.1–1.0)
Monocytes Relative: 7 %
Neutro Abs: 3.8 10*3/uL (ref 1.7–7.7)
Neutrophils Relative %: 88 %
Platelets: 67 10*3/uL — ABNORMAL LOW (ref 150–400)
RBC: 3.57 MIL/uL — ABNORMAL LOW (ref 3.87–5.11)
RDW: 28.7 % — ABNORMAL HIGH (ref 11.5–15.5)
WBC: 4.2 10*3/uL (ref 4.0–10.5)
nRBC: 0 % (ref 0.0–0.2)

## 2022-03-01 MED ORDER — VANCOMYCIN HCL 125 MG PO CAPS
125.0000 mg | ORAL_CAPSULE | Freq: Four times a day (QID) | ORAL | Status: DC
  Administered 2022-03-01 – 2022-03-03 (×9): 125 mg via ORAL
  Filled 2022-03-01 (×11): qty 1

## 2022-03-01 MED ORDER — POTASSIUM CHLORIDE 20 MEQ PO PACK
40.0000 meq | PACK | ORAL | Status: AC
  Administered 2022-03-01 (×2): 40 meq via ORAL
  Filled 2022-03-01 (×2): qty 2

## 2022-03-01 MED ORDER — HYDROMORPHONE HCL 1 MG/ML IJ SOLN
0.5000 mg | Freq: Four times a day (QID) | INTRAMUSCULAR | Status: DC | PRN
  Administered 2022-03-01 – 2022-03-02 (×3): 0.5 mg via INTRAVENOUS
  Filled 2022-03-01 (×4): qty 1

## 2022-03-01 MED ORDER — OXYCODONE HCL 5 MG PO TABS
5.0000 mg | ORAL_TABLET | Freq: Once | ORAL | Status: AC
  Administered 2022-03-01: 5 mg via ORAL
  Filled 2022-03-01: qty 1

## 2022-03-01 NOTE — Progress Notes (Addendum)
PROGRESS NOTE    Briana Sweeney  MVH:[id number] DOB: [birth year] DOA: 02/27/2022 PCP: Abner Greenspan, MD   Brief Narrative:  Briana Sweeney is a 51 y.o. 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  Recently admitted 5/16-5/18 for ascites and underwent paracentesis on 5/16 with removal of 4 L peritoneal fluid.  Admitted again 5/24-5/27 for ascites and underwent paracentesis on 5/25 with removal of 3.6 L peritoneal fluid.  EGD done 5/26 showing tiny hiatal hernia, grade 1 esophageal varices, and portal hypertensive gastropathy.  GI had recommended 40 mg of prednisone for 1 month in case some of her liver disease was due to alcoholic hepatitis based on her Maddrey discriminatory function.   Patient presented to the ED today complaining of abdominal distention.  Also reported a mechanical fall at home yesterday where she struck her head.  Patient was tachycardic and tremulous in the ED.  Labs showing WBC 4.9, hemoglobin 9.6 (stable), platelet count 91.  Sodium 134, potassium 3.2, chloride 99, bicarb 22, BUN 7, creatinine 0.4, glucose 120, calcium 8.6, albumin 2.7, AST 107, ALT 43, alk phos 253, T. bili 3.5, lipase 75, PT 15.9, INR 1.3.  Beta-hCG negative.  CT head negative for acute finding. Patient was given Ativan per CIWA protocol.  Also given Toradol, Reglan, and thiamine.  ED physician discussed the case with Dr. Grace Isaac with IR and ultrasound-guided paracentesis ordered.   Patient states she was walking her dog yesterday evening and fell as her dog pulled the leash.  She struck her head and felt dizzy afterwards but denies loss of consciousness.  She does not take any blood thinners.  She is also reporting recurrent abdominal distention and discomfort for which she  has undergone paracentesis in the past.  She is taking Lasix 40 mg daily and spironolactone 50 mg daily.  Denies fevers, nausea, or vomiting.  She is having regular bowel movements.  She continues to drink 24 ounces of beer daily.  No other complaints.  Assessment & Plan:   Principal Problem:   Alcoholic cirrhosis of liver with ascites (HCC) Active Problems:   Hypokalemia   Alcohol use disorder, severe, dependence (HCC)   Esophageal varices without bleeding (HCC)   Thrombocytopenia (HCC)   Seizure disorder (HCC)   Depression   Alcohol withdrawal (HCC)   ALC (alcoholic liver cirrhosis) (HCC)   C. difficile colitis  Decompensated alcoholic liver cirrhosis/recurrent ascites: Status post paracentesis with 3.4 L of clear yellow fluid removal on 02/28/2022.  Exam/work-up negative for SBP.  We will follow culture though.   Mildly elevated lipase: Lipase not high enough to qualify for pancreatitis and CT is negative for pancreatitis as well.  Alcohol use disorder, severe, dependence/acute alcohol withdrawal: Withdrawal symptoms much improved.  Continue Librium with as needed Ativan, CIWA has remained under 5 for last 24 hours.  Acute C. difficile colitis: CT abdomen shows possibility of acute colitis and she complained of diarrhea, C. difficile is positive.  Will now discontinue Zosyn and start on oral vancomycin.   Chronic thrombocytopenia -Patient is followed by outpatient hematology and chronic thrombocytopenia felt to be secondary to both ITP and alcoholic cirrhosis/secondary splenomegaly. On weekly Nplate injections and prednisone. -No signs of active bleeding -Continue prednisone -Monitor platelet level closely.  Slight drop in platelets today.   Iron deficiency anemia -Followed by outpatient hematology and being treated  with IV iron -Hemoglobin currently stable, continue to monitor  Hypokalemia: Low again.  Will replace.  Magnesium normal.   Esophageal varices Patient denies any  recent bleeding. -Continue nadolol   Anxiety, depression, schizophrenia -Continue home medications   Seizure disorder -Continue Keppra   GERD -Continue Protonix  Fall/generalized weakness: Consult PT OT.  DVT prophylaxis: SCDs Start: 02/27/22 2211   Code Status: Full Code  Family Communication:  None present at bedside.  Plan of care discussed with patient in length and he/she verbalized understanding and agreed with it.  Status is: Inpatient Remains inpatient appropriate because: Patient is still having few episodes of diarrhea with significant weakness and alcohol withdrawal.     Estimated body mass index is 27.67 kg/m as calculated from the following:   Height as of this encounter: 4\' 11"  (1.499 m).   Weight as of this encounter: 62.1 kg.    Nutritional Assessment: Body mass index is 27.67 kg/m.[name] Kitchen Seen by dietician.  I agree with the assessment and plan as outlined below: Nutrition Status:        . Skin Assessment: I have examined the patient's skin and I agree with the wound assessment as performed by the wound care RN as outlined below:    Consultants:  None  Procedures:  As above  Antimicrobials:  Anti-infectives (From admission, onward)    Start     Dose/Rate Route Frequency Ordered Stop   03/01/22 1000  vancomycin (VANCOCIN) capsule 125 mg        125 mg Oral 4 times daily 03/01/22 0755 03/11/22 0959   02/28/22 1145  piperacillin-tazobactam (ZOSYN) IVPB 3.375 g  Status:  Discontinued        3.375 g 12.5 mL/hr over 240 Minutes Intravenous Every 8 hours 02/28/22 1059 03/01/22 0758         Subjective:  Patient seen and examined.  She still complains of weakness and some anxiety.  She says that she has had 4 loose bowel movements in last 24 hours.  Still complains of right-sided abdominal pain.  Objective: Vitals:   03/01/22 0400 03/01/22 0500 03/01/22 0600 03/01/22 0809  BP: 117/79 123/79 113/81   Pulse: 95 93 93   Resp: 16 19 (!) 21    Temp:    97.9 F (36.6 C)  TempSrc:    Oral  SpO2: 92% 93% 96%   Weight:      Height:        Intake/Output Summary (Last 24 hours) at 03/01/2022 1124 Last data filed at 03/01/2022 1610 Gross per 24 hour  Intake 320 ml  Output --  Net 320 ml   Filed Weights   02/27/22 1640  Weight: 62.1 kg    Examination:  General exam: Appears calm as compared to yesterday. Respiratory system: Clear to auscultation. Respiratory effort normal. Cardiovascular system: S1 & S2 heard, RRR. No JVD, murmurs, rubs, gallops or clicks. No pedal edema. Gastrointestinal system: Abdomen is soft, mildly distended, right upper quadrant and lower quadrant tenderness. No organomegaly or masses felt. Normal bowel sounds heard. Central nervous system: Alert and oriented. No focal neurological deficits. Extremities: Symmetric 5 x 5 power. Skin: No rashes, lesions or ulcers.  Psychiatry: Judgement and insight appear poor  Data Reviewed: I have personally reviewed following labs and imaging studies  CBC: Recent Labs  Lab 02/25/22 0000 02/27/22 1734 02/28/22 0500 03/01/22 0251  WBC 5.5 4.9 5.0 4.2  NEUTROABS 4.62 4.3  --  3.8  HGB 9.1* 9.6* 9.1* 9.4*  HCT 30*  31.4* 30.2* 32.5*  MCV  --  85.3 87.5 91.0  PLT 124* 91* 71* 67*   Basic Metabolic Panel: Recent Labs  Lab 02/27/22 1734 02/28/22 0500 03/01/22 0251 03/01/22 0831  NA 134* 137 132*  --   K 3.2* 3.7 3.2*  --   CL 99 103 100  --   CO2 22 23 23   --   GLUCOSE 120* 105* 127*  --   BUN 7 8 7   --   CREATININE 0.48 0.54 0.57  --   CALCIUM 8.6* 8.4* 8.0*  --   MG  --  1.7  --  1.7   GFR: Estimated Creatinine Clearance: 68.2 mL/min (by C-G formula based on SCr of 0.57 mg/dL). Liver Function Tests: Recent Labs  Lab 02/27/22 1734 02/28/22 0500 03/01/22 0251  AST 107* 92* 103*  ALT 43 38 38  ALKPHOS 253* 203* 220*  BILITOT 3.5* 3.9* 3.7*  PROT 7.2 6.7 6.2*  ALBUMIN 2.7* 2.5* 2.4*   Recent Labs  Lab 02/27/22 1734  LIPASE 75*   No  results for input(s): "AMMONIA" in the last 168 hours. Coagulation Profile: Recent Labs  Lab 02/27/22 1734 02/28/22 0500  INR 1.3* 1.3*   Cardiac Enzymes: No results for input(s): "CKTOTAL", "CKMB", "CKMBINDEX", "TROPONINI" in the last 168 hours. BNP (last 3 results) No results for input(s): "PROBNP" in the last 8760 hours. HbA1C: No results for input(s): "HGBA1C" in the last 72 hours. CBG: Recent Labs  Lab 02/28/22 1933 02/28/22 2309  GLUCAP 164* 113*   Lipid Profile: No results for input(s): "CHOL", "HDL", "LDLCALC", "TRIG", "CHOLHDL", "LDLDIRECT" in the last 72 hours. Thyroid Function Tests: No results for input(s): "TSH", "T4TOTAL", "FREET4", "T3FREE", "THYROIDAB" in the last 72 hours. Anemia Panel: No results for input(s): "VITAMINB12", "FOLATE", "FERRITIN", "TIBC", "IRON", "RETICCTPCT" in the last 72 hours. Sepsis Labs: Recent Labs  Lab 02/28/22 1055  PROCALCITON <0.10    Recent Results (from the past 240 hour(s))  MRSA Next Gen by PCR, Nasal     Status: Abnormal   Collection Time: 02/28/22 10:07 AM   Specimen: Nasal Mucosa; Nasal Swab  Result Value Ref Range Status   MRSA by PCR Next Gen DETECTED (A) NOT DETECTED Final    Comment: (NOTE) The GeneXpert MRSA Assay (FDA approved for NASAL specimens only), is one component of a comprehensive MRSA colonization surveillance program. It is not intended to diagnose MRSA infection nor to guide or monitor treatment for MRSA infections. Test performance is not FDA approved in patients less than 65 years old. Performed at [facility], [address], [place], [place] [address]   C Difficile Quick Screen w PCR reflex     Status: Abnormal   Collection Time: 02/28/22 10:48 AM   Specimen: STOOL  Result Value Ref Range Status   C Diff antigen POSITIVE (A) NEGATIVE Final   C Diff toxin NEGATIVE NEGATIVE Final   C Diff interpretation Results are indeterminate. See PCR results.  Final    Comment:  Performed at [facility], [address] [place], [place] [address]  C. Diff by PCR, Reflexed     Status: Abnormal   Collection Time: 02/28/22 10:48 AM  Result Value Ref Range Status   Toxigenic C. Difficile by PCR POSITIVE (A) NEGATIVE Final    Comment: Positive for toxigenic C. difficile with little to no toxin production. Only treat if clinical presentation suggests symptomatic illness. Performed at [facility], [address], [place], [place] [address]  Culture, body fluid w Gram Stain-bottle     Status: None (Preliminary result)   Collection Time: 02/28/22  4:57 PM   Specimen: Peritoneal Washings  Result Value Ref Range Status   Specimen Description PERITONEAL  Final   Special Requests NONE  Final   Culture   Final    NO GROWTH < 24 HOURS Performed at [facility], [address], [place], [place] [address]    Report Status PENDING  Incomplete  Gram stain     Status: None   Collection Time: 02/28/22  4:57 PM   Specimen: Peritoneal Washings  Result Value Ref Range Status   Specimen Description PERITONEAL  Final   Special Requests NONE  Final   Gram Stain   Final    WBC PRESENT, PREDOMINANTLY MONONUCLEAR NO ORGANISMS SEEN CYTOSPIN SMEAR Performed at [facility], [address] [place], [place] [address]    Report Status 02/28/2022 FINAL  Final     Radiology Studies: US Paracentesis  Result Date: 02/28/2022 INDICATION: Patient with history of alcoholic cirrhosis, esophageal varices and recurrent ascites. Request received for diagnostic and therapeutic paracentesis up to 5 L. EXAM: ULTRASOUND GUIDED DIAGNOSTIC AND THERAPEUTIC LEFT LOWER QUADRANT PARACENTESIS MEDICATIONS: 10 mL 1 % lidocaine COMPLICATIONS: None immediate. PROCEDURE: Informed written consent was obtained from the patient after a discussion of the risks, benefits and alternatives to treatment. A timeout was performed prior to the initiation of the procedure. Initial  ultrasound scanning demonstrates a large amount of ascites within the left lower abdominal quadrant. The left lower abdomen was prepped and draped in the usual sterile fashion. 1% lidocaine was used for local anesthesia. Following this, a 19 gauge, 10-cm, Yueh catheter was introduced. An ultrasound image was saved for documentation purposes. The paracentesis was performed. The catheter was removed and a dressing was applied. The patient tolerated the procedure well without immediate post procedural complication. FINDINGS: A total of approximately 3.4 L of clear, yellow fluid was removed. Samples were sent to the laboratory as requested by the clinical team. IMPRESSION: Successful ultrasound-guided paracentesis yielding 3.4 liters of peritoneal fluid. PLAN: The patient has previously been formally evaluated by the [facility] Radiology [facility] and is being actively followed for potential future intervention. Electronically Signed   By: Marliss Coots M.D.   On: 02/28/2022 14:13   CT ABDOMEN PELVIS WO CONTRAST  Result Date: 02/28/2022 CLINICAL DATA:  Pancreatitis suspected. Abdominal swelling, cirrhosis, multiple paracentesis. EXAM: CT ABDOMEN AND PELVIS WITHOUT CONTRAST TECHNIQUE: Multidetector CT imaging of the abdomen and pelvis was performed following the standard protocol without IV contrast. RADIATION DOSE REDUCTION: This exam was performed according to the departmental dose-optimization program which includes automated exposure control, adjustment of the mA and/or kV according to patient size and/or use of iterative reconstruction technique. COMPARISON:  01/30/2022. FINDINGS: Lower chest: Calcified lymph nodes are present at the left hilum. The heart is normal in size and there is no pericardial effusion. Atelectasis is noted at the lung bases. Hepatobiliary: The liver has an irregular contour, compatible with underlying cirrhosis. Fatty infiltration is noted and there is  heterogeneous attenuation. A stone is present in the gallbladder. No biliary ductal dilatation. Pancreas: Calcifications are noted in the head of the pancreas. No pancreatic ductal dilatation or pseudocyst. Spleen: Normal in size without focal abnormality. Adrenals/Urinary Tract: No adrenal nodule or mass. No renal calculus or hydronephrosis. The bladder is unremarkable. Stomach/Bowel: There is thickening of the walls of the distal esophagus with multiple  paraesophageal varices. Possible small hiatal hernia. There is diffuse gastric wall thickening. Appendix appears normal. No bowel obstruction is seen. No free air or pneumatosis. There is colonic wall thickening involving the cecum, ascending, transverse, and descending colon, worsening from the prior exam. Vascular/Lymphatic: Aortic atherosclerosis. No enlarged abdominal or pelvic lymph nodes. Reproductive: Uterus and bilateral adnexa are unremarkable. Other: Moderate-to-large ascites. Musculoskeletal: No acute osseous abnormality. IMPRESSION: 1. Interval worsening of colonic wall thickening, most pronounced at the ascending colon suggesting colitis. 2. Thickening of the walls of the distal esophagus and gastric mucosa, possible esophagitis/gastritis. 3. Moderate-to-large ascites. 4. Morphologic changes of cirrhosis and portal hypertension. 5. Aortic atherosclerosis. Electronically Signed   By: Thornell Sartorius M.D.   On: 02/28/2022 02:27   CT Head Wo Contrast  Result Date: 02/27/2022 CLINICAL DATA:  Fall last night.  Head trauma EXAM: CT HEAD WITHOUT CONTRAST TECHNIQUE: Contiguous axial images were obtained from the base of the skull through the vertex without intravenous contrast. RADIATION DOSE REDUCTION: This exam was performed according to the departmental dose-optimization program which includes automated exposure control, adjustment of the mA and/or kV according to patient size and/or use of iterative reconstruction technique. COMPARISON:  CT head  12/04/2021 FINDINGS: Brain: Generalized atrophy. Negative for hydrocephalus. Mild periventricular white matter hypodensity. Small calcification left cerebellum unchanged. Negative for acute infarct, hemorrhage, mass Vascular: Negative for hyperdense vessel Skull: Negative Sinuses/Orbits: Negative Other: None IMPRESSION: Generalized atrophy.  No acute abnormality. Electronically Signed   By: Marlan Palau M.D.   On: 02/27/2022 17:30    Scheduled Meds:  ARIPiprazole  10 mg Oral q morning   chlordiazePOXIDE  25 mg Oral TID   Chlorhexidine Gluconate Cloth  6 each Topical Daily   FLUoxetine  40 mg Oral q morning   folic acid  1 mg Oral Daily   furosemide  40 mg Oral Daily   lactulose  20 g Oral TID   levETIRAcetam  750 mg Oral BID   multivitamin with minerals  1 tablet Oral Daily   mupirocin ointment  1 Application Nasal BID   nadolol  20 mg Oral q morning   pantoprazole  40 mg Oral q morning   potassium chloride  40 mEq Oral Q4H   predniSONE  20 mg Oral BID   spironolactone  50 mg Oral Daily   thiamine  100 mg Oral Daily   Or   thiamine  100 mg Intravenous Daily   vancomycin  125 mg Oral QID   Continuous Infusions:     LOS: 1 day   Hughie Closs, MD [facility]  03/01/2022, 11:24 AM   *Please note that this is a verbal dictation therefore any spelling or grammatical errors are due to the "Dragon Medical One" system interpretation.  Please page via Amion and do not message via secure chat for urgent patient care matters. Secure chat can be used for non urgent patient care matters.  How to contact the [facility] Attending or Consulting provider 7A - 7P or covering provider during after hours 7P -7A, for this patient?  Check the care team in [facility] and look for a) attending/consulting TRH provider listed and b) the [facility] team listed. Page or secure chat 7A-7P. Log into [email or web address] and use [place]'s universal password to access. If you do not have the password, please contact the  hospital operator. Locate the [facility] provider you are looking for under [facility] and page to a number that you can be directly reached. If you still have  difficulty reaching the provider, please page the [facility] (Director on Call) for the Hospitalists listed on amion for assistance.

## 2022-03-01 NOTE — TOC Benefit Eligibility Note (Signed)
Patient Product/process development scientist completed.    The patient is currently admitted and upon discharge could be taking Vancomycin 125 mg Capsules.  The current 10 day co-pay is, $4.00.   The patient is insured through [facility]    Roland Earl, CPhT Pharmacy Patient Advocate Specialist [facility] Patient Advocate Team Direct Number: [phone]  Fax: [phone]

## 2022-03-02 DIAGNOSIS — K7031 Alcoholic cirrhosis of liver with ascites: Secondary | ICD-10-CM | POA: Diagnosis not present

## 2022-03-02 LAB — URINALYSIS, ROUTINE W REFLEX MICROSCOPIC
Bacteria, UA: NONE SEEN
Bilirubin Urine: NEGATIVE
Glucose, UA: NEGATIVE mg/dL
Ketones, ur: NEGATIVE mg/dL
Nitrite: NEGATIVE
Protein, ur: NEGATIVE mg/dL
Specific Gravity, Urine: 1.006 (ref 1.005–1.030)
pH: 7 (ref 5.0–8.0)

## 2022-03-02 LAB — CBC WITH DIFFERENTIAL/PLATELET
Abs Immature Granulocytes: 0.03 10*3/uL (ref 0.00–0.07)
Basophils Absolute: 0 10*3/uL (ref 0.0–0.1)
Basophils Relative: 0 %
Eosinophils Absolute: 0.1 10*3/uL (ref 0.0–0.5)
Eosinophils Relative: 1 %
HCT: 32.4 % — ABNORMAL LOW (ref 36.0–46.0)
Hemoglobin: 9.6 g/dL — ABNORMAL LOW (ref 12.0–15.0)
Immature Granulocytes: 1 %
Lymphocytes Relative: 4 %
Lymphs Abs: 0.3 10*3/uL — ABNORMAL LOW (ref 0.7–4.0)
MCH: 26.6 pg (ref 26.0–34.0)
MCHC: 29.6 g/dL — ABNORMAL LOW (ref 30.0–36.0)
MCV: 89.8 fL (ref 80.0–100.0)
Monocytes Absolute: 0.4 10*3/uL (ref 0.1–1.0)
Monocytes Relative: 7 %
Neutro Abs: 5 10*3/uL (ref 1.7–7.7)
Neutrophils Relative %: 87 %
Platelets: 75 10*3/uL — ABNORMAL LOW (ref 150–400)
RBC: 3.61 MIL/uL — ABNORMAL LOW (ref 3.87–5.11)
RDW: 27.8 % — ABNORMAL HIGH (ref 11.5–15.5)
WBC: 5.8 10*3/uL (ref 4.0–10.5)
nRBC: 0 % (ref 0.0–0.2)

## 2022-03-02 LAB — COMPREHENSIVE METABOLIC PANEL
ALT: 35 U/L (ref 0–44)
AST: 79 U/L — ABNORMAL HIGH (ref 15–41)
Albumin: 2.3 g/dL — ABNORMAL LOW (ref 3.5–5.0)
Alkaline Phosphatase: 214 U/L — ABNORMAL HIGH (ref 38–126)
Anion gap: 7 (ref 5–15)
BUN: 5 mg/dL — ABNORMAL LOW (ref 6–20)
CO2: 23 mmol/L (ref 22–32)
Calcium: 8.2 mg/dL — ABNORMAL LOW (ref 8.9–10.3)
Chloride: 101 mmol/L (ref 98–111)
Creatinine, Ser: 0.55 mg/dL (ref 0.44–1.00)
GFR, Estimated: >60 mL/min (ref >60)
Glucose, Bld: 132 mg/dL — ABNORMAL HIGH (ref 70–99)
Potassium: 3.5 mmol/L (ref 3.5–5.1)
Sodium: 131 mmol/L — ABNORMAL LOW (ref 135–145)
Total Bilirubin: 3.3 mg/dL — ABNORMAL HIGH (ref 0.3–1.2)
Total Protein: 6.3 g/dL — ABNORMAL LOW (ref 6.5–8.1)

## 2022-03-02 LAB — CYTOLOGY - NON PAP

## 2022-03-02 LAB — MAGNESIUM: Magnesium: 1.7 mg/dL (ref 1.7–2.4)

## 2022-03-02 MED ORDER — DIPHENHYDRAMINE HCL 25 MG PO CAPS
50.0000 mg | ORAL_CAPSULE | Freq: Four times a day (QID) | ORAL | Status: AC | PRN
  Administered 2022-03-02: 50 mg via ORAL
  Filled 2022-03-02: qty 2

## 2022-03-02 MED ORDER — CHLORDIAZEPOXIDE HCL 5 MG PO CAPS
25.0000 mg | ORAL_CAPSULE | Freq: Three times a day (TID) | ORAL | Status: AC
  Administered 2022-03-02 (×3): 25 mg via ORAL
  Filled 2022-03-02: qty 1
  Filled 2022-03-02: qty 5
  Filled 2022-03-02: qty 1

## 2022-03-02 MED ORDER — ACETAMINOPHEN 325 MG PO TABS
650.0000 mg | ORAL_TABLET | Freq: Three times a day (TID) | ORAL | Status: DC | PRN
  Administered 2022-03-02: 650 mg via ORAL
  Filled 2022-03-02: qty 2

## 2022-03-02 MED ORDER — DIPHENHYDRAMINE HCL 25 MG PO CAPS: 50.0000 mg | ORAL_CAPSULE | Freq: Four times a day (QID) | ORAL | Status: DC | PRN

## 2022-03-02 NOTE — Progress Notes (Signed)
On assessment, patient appeared lethargic, able to answer neuro questions appropriately, facial/grip symmetrical, PERRL continues to complain of 8/10 headache without relief from PRN medications. Informed Dr. Jacqulyn Bath; verbal order to hold sedative medications at this time, continue to monitor patient for any changes, and continue with current plan of care.

## 2022-03-02 NOTE — Progress Notes (Signed)
Patient c/o consistent headache without relief from PRN dilaudid. Informed Dr. Jacqulyn Bath and inquired whether imaging would be appropriate at this time. PRN Tylenol ordered and no imaging at this time ordered.

## 2022-03-03 DIAGNOSIS — K7031 Alcoholic cirrhosis of liver with ascites: Secondary | ICD-10-CM | POA: Diagnosis not present

## 2022-03-03 LAB — COMPREHENSIVE METABOLIC PANEL
ALT: 35 U/L (ref 0–44)
AST: 67 U/L — ABNORMAL HIGH (ref 15–41)
Albumin: 2.3 g/dL — ABNORMAL LOW (ref 3.5–5.0)
Alkaline Phosphatase: 220 U/L — ABNORMAL HIGH (ref 38–126)
Anion gap: 8 (ref 5–15)
BUN: 7 mg/dL (ref 6–20)
CO2: 23 mmol/L (ref 22–32)
Calcium: 8.4 mg/dL — ABNORMAL LOW (ref 8.9–10.3)
Chloride: 104 mmol/L (ref 98–111)
Creatinine, Ser: 0.6 mg/dL (ref 0.44–1.00)
GFR, Estimated: >60 mL/min (ref >60)
Glucose, Bld: 114 mg/dL — ABNORMAL HIGH (ref 70–99)
Potassium: 3.5 mmol/L (ref 3.5–5.1)
Sodium: 135 mmol/L (ref 135–145)
Total Bilirubin: 2.8 mg/dL — ABNORMAL HIGH (ref 0.3–1.2)
Total Protein: 6.1 g/dL — ABNORMAL LOW (ref 6.5–8.1)

## 2022-03-03 LAB — CBC WITH DIFFERENTIAL/PLATELET
Abs Immature Granulocytes: 0.05 10*3/uL (ref 0.00–0.07)
Basophils Absolute: 0 10*3/uL (ref 0.0–0.1)
Basophils Relative: 0 %
Eosinophils Absolute: 0.1 10*3/uL (ref 0.0–0.5)
Eosinophils Relative: 2 %
HCT: 32.5 % — ABNORMAL LOW (ref 36.0–46.0)
Hemoglobin: 9.8 g/dL — ABNORMAL LOW (ref 12.0–15.0)
Immature Granulocytes: 1 %
Lymphocytes Relative: 6 %
Lymphs Abs: 0.4 10*3/uL — ABNORMAL LOW (ref 0.7–4.0)
MCH: 26.8 pg (ref 26.0–34.0)
MCHC: 30.2 g/dL (ref 30.0–36.0)
MCV: 89 fL (ref 80.0–100.0)
Monocytes Absolute: 0.5 10*3/uL (ref 0.1–1.0)
Monocytes Relative: 7 %
Neutro Abs: 5.6 10*3/uL (ref 1.7–7.7)
Neutrophils Relative %: 84 %
Platelets: 89 10*3/uL — ABNORMAL LOW (ref 150–400)
RBC: 3.65 MIL/uL — ABNORMAL LOW (ref 3.87–5.11)
RDW: 27.4 % — ABNORMAL HIGH (ref 11.5–15.5)
WBC: 6.6 10*3/uL (ref 4.0–10.5)
nRBC: 0 % (ref 0.0–0.2)

## 2022-03-03 MED ORDER — LORAZEPAM 1 MG PO TABS: 1.0000 mg | ORAL_TABLET | ORAL | Status: DC | PRN

## 2022-03-03 MED ORDER — THIAMINE HCL 100 MG/ML IJ SOLN: 100.0000 mg | Freq: Every day | INTRAMUSCULAR | Status: DC

## 2022-03-03 MED ORDER — THIAMINE HCL 100 MG PO TABS
100.0000 mg | ORAL_TABLET | Freq: Every day | ORAL | Status: DC
  Administered 2022-03-03: 100 mg via ORAL
  Filled 2022-03-03: qty 1

## 2022-03-03 MED ORDER — LORAZEPAM 2 MG/ML IJ SOLN: 1.0000 mg | INTRAMUSCULAR | Status: DC | PRN

## 2022-03-03 MED ORDER — ADULT MULTIVITAMIN W/MINERALS CH
1.0000 | ORAL_TABLET | Freq: Every day | ORAL | Status: DC
  Administered 2022-03-03: 1 via ORAL
  Filled 2022-03-03: qty 1

## 2022-03-03 MED ORDER — FOLIC ACID 1 MG PO TABS
1.0000 mg | ORAL_TABLET | Freq: Every day | ORAL | Status: DC
  Administered 2022-03-03: 1 mg via ORAL
  Filled 2022-03-03: qty 1

## 2022-03-03 MED ORDER — VANCOMYCIN HCL 125 MG PO CAPS
125.0000 mg | ORAL_CAPSULE | Freq: Four times a day (QID) | ORAL | 0 refills | Status: AC
  Filled 2022-03-03: qty 28, 7d supply, fill #0

## 2022-03-03 NOTE — TOC Transition Note (Signed)
Transition of Care [facility]) - CM/SW Discharge Note   Patient Details  Name: Briana Sweeney MRN: [id number] Date of Birth: 1971/03/21  Transition [facility]) CM/SW Contact:  Darleene Cleaver, LCSW Phone Number: 03/03/2022, 9:32 AM   Clinical Narrative:     Substance abuse resources have been placed on patient's AVS.  CSW signing off, patient made aware on 6/23 that she needs to find a ride home.   Final next level of care: Home/Self Care Barriers to Discharge: Barriers Resolved   Patient Goals and CMS Choice Patient states their goals for this hospitalization and ongoing recovery are:: To return back home.      Discharge Placement                       Discharge Plan and Services                                     Social Determinants of Health (SDOH) Interventions     Readmission Risk Interventions    03/01/2022   12:43 PM 02/02/2022   12:29 PM 07/06/2019    3:09 PM  Readmission Risk Prevention Plan  Transportation Screening Complete Complete Complete  Medication Review Oceanographer) Complete Complete Complete  PCP or Specialist appointment within 3-5 days of discharge Complete Complete Complete  HRI or Home Care Consult Complete Complete Complete  SW Recovery Care/Counseling Consult Complete Complete Complete  Palliative Care Screening Not Applicable Complete Not Applicable  Skilled Nursing Facility Not Applicable Not Applicable Not Applicable

## 2022-03-04 ENCOUNTER — Inpatient Hospital Stay: Payer: Medicaid Other

## 2022-03-04 ENCOUNTER — Other Ambulatory Visit (HOSPITAL_COMMUNITY): Payer: Self-pay

## 2022-03-04 DIAGNOSIS — D693 Immune thrombocytopenic purpura: Secondary | ICD-10-CM

## 2022-03-05 ENCOUNTER — Other Ambulatory Visit (HOSPITAL_COMMUNITY): Payer: Self-pay

## 2022-03-05 ENCOUNTER — Other Ambulatory Visit: Payer: Self-pay | Admitting: Pharmacist

## 2022-03-05 ENCOUNTER — Other Ambulatory Visit: Payer: Self-pay | Admitting: Oncology

## 2022-03-05 ENCOUNTER — Encounter (HOSPITAL_COMMUNITY): Payer: Self-pay | Admitting: Pharmacist

## 2022-03-05 LAB — CULTURE, BODY FLUID W GRAM STAIN -BOTTLE: Culture: NO GROWTH

## 2022-03-06 ENCOUNTER — Inpatient Hospital Stay: Payer: Medicaid Other

## 2022-03-06 ENCOUNTER — Other Ambulatory Visit (HOSPITAL_COMMUNITY): Payer: Self-pay

## 2022-03-06 VITALS — BP 106/81 | HR 84 | Temp 98.2°F | Resp 18 | Ht 59.0 in | Wt 144.8 lb

## 2022-03-06 DIAGNOSIS — R161 Splenomegaly, not elsewhere classified: Secondary | ICD-10-CM | POA: Diagnosis not present

## 2022-03-06 DIAGNOSIS — D696 Thrombocytopenia, unspecified: Secondary | ICD-10-CM

## 2022-03-06 DIAGNOSIS — K7030 Alcoholic cirrhosis of liver without ascites: Secondary | ICD-10-CM | POA: Diagnosis not present

## 2022-03-06 DIAGNOSIS — D693 Immune thrombocytopenic purpura: Secondary | ICD-10-CM | POA: Diagnosis present

## 2022-03-06 DIAGNOSIS — D509 Iron deficiency anemia, unspecified: Secondary | ICD-10-CM | POA: Diagnosis not present

## 2022-03-06 MED ORDER — SODIUM CHLORIDE 0.9 % IV SOLN: Freq: Once | INTRAVENOUS | Status: AC

## 2022-03-06 MED ORDER — ROMIPLOSTIM 250 MCG ~~LOC~~ SOLR
3.8000 ug/kg | Freq: Once | SUBCUTANEOUS | Status: AC
  Administered 2022-03-06: 250 ug via SUBCUTANEOUS
  Filled 2022-03-06: qty 0.5

## 2022-03-06 MED ORDER — SODIUM CHLORIDE 0.9 % IV SOLN
510.0000 mg | Freq: Once | INTRAVENOUS | Status: AC
  Administered 2022-03-06: 510 mg via INTRAVENOUS
  Filled 2022-03-06: qty 510

## 2022-03-06 MED ORDER — FAMOTIDINE IN NACL 20-0.9 MG/50ML-% IV SOLN
20.0000 mg | Freq: Once | INTRAVENOUS | Status: AC
  Administered 2022-03-06: 20 mg via INTRAVENOUS
  Filled 2022-03-06: qty 50

## 2022-03-06 MED FILL — Romiplostim For Inj 250 MCG: SUBCUTANEOUS | Qty: 0.5 | Status: AC

## 2022-03-06 NOTE — Patient Instructions (Addendum)
Romiplostim injection What is this medication? ROMIPLOSTIM (roe mi PLOE stim) helps your body make more platelets. This medicine is used to treat low platelets caused by chronic idiopathic thrombocytopenic purpura (ITP) or a bone marrow syndrome caused by radiation sickness. This medicine may be used for other purposes; ask your health care provider or pharmacist if you have questions. COMMON BRAND NAME(S): Nplate What should I tell my care team before I take this medication? They need to know if you have any of these conditions: blood clots myelodysplastic syndrome an unusual or allergic reaction to romiplostim, mannitol, other medicines, foods, dyes, or preservatives pregnant or trying to get pregnant breast-feeding How should I use this medication? This medicine is injected under the skin. It is given by a health care provider in a hospital or clinic setting. A special MedGuide will be given to you before each treatment. Be sure to read this information carefully each time. Talk to your health care provider about the use of this medicine in children. While it may be prescribed for children as young as newborns for selected conditions, precautions do apply. Overdosage: If you think you have taken too much of this medicine contact a poison control center or emergency room at once. NOTE: This medicine is only for you. Do not share this medicine with others. What if I miss a dose? Keep appointments for follow-up doses. It is important not to miss your dose. Call your health care provider if you are unable to keep an appointment. What may interact with this medication? Interactions are not expected. This list may not describe all possible interactions. Give your health care provider a list of all the medicines, herbs, non-prescription drugs, or dietary supplements you use. Also tell them if you smoke, drink alcohol, or use illegal drugs. Some items may interact with your medicine. What should I  watch for while using this medication? Visit your health care provider for regular checks on your progress. You may need blood work done while you are taking this medicine. Your condition will be monitored carefully while you are receiving this medicine. It is important not to miss any appointments. What side effects may I notice from receiving this medication? Side effects that you should report to your doctor or health care professional as soon as possible: allergic reactions (skin rash, itching or hives; swelling of the face, lips, or tongue) bleeding (bloody or black, tarry stools; red or dark brown urine; spitting up blood or brown material that looks like coffee grounds; red spots on the skin; unusual bruising or bleeding from the eyes, gums, or nose) blood clot (chest pain; shortness of breath; pain, swelling, or warmth in the leg) stroke (changes in vision; confusion; trouble speaking or understanding; severe headaches; sudden numbness or weakness of the face, arm or leg; trouble walking; dizziness; loss of balance or coordination) Side effects that usually do not require medical attention (report to your doctor or health care professional if they continue or are bothersome): diarrhea dizziness headache joint pain muscle pain stomach pain trouble sleeping This list may not describe all possible side effects. Call your doctor for medical advice about side effects. You may report side effects to FDA at 1-800-FDA-1088. Where should I keep my medication? This medicine is given in a hospital or clinic. It will not be stored at home. NOTE: This sheet is a summary. It may not cover all possible information. If you have questions about this medicine, talk to your doctor, pharmacist, or health care provider.    2023 Elsevier/Gold Standard (2021-07-27 00:00:00) Ferumoxytol Injection What is this medication? FERUMOXYTOL (FER ue MOX i tol) treats low levels of iron in your body (iron deficiency  anemia). Iron is a mineral that plays an important role in making red blood cells, which carry oxygen from your lungs to the rest of your body. This medicine may be used for other purposes; ask your health care provider or pharmacist if you have questions. COMMON BRAND NAME(S): Feraheme What should I tell my care team before I take this medication? They need to know if you have any of these conditions: Anemia not caused by low iron levels High levels of iron in the blood Magnetic resonance imaging (MRI) test scheduled An unusual or allergic reaction to iron, other medications, foods, dyes, or preservatives Pregnant or trying to get pregnant Breast-feeding How should I use this medication? This medication is for injection into a vein. It is given in a hospital or clinic setting. Talk to your care team the use of this medication in children. Special care may be needed. Overdosage: If you think you have taken too much of this medicine contact a poison control center or emergency room at once. NOTE: This medicine is only for you. Do not share this medicine with others. What if I miss a dose? It is important not to miss your dose. Call your care team if you are unable to keep an appointment. What may interact with this medication? Other iron products This list may not describe all possible interactions. Give your health care provider a list of all the medicines, herbs, non-prescription drugs, or dietary supplements you use. Also tell them if you smoke, drink alcohol, or use illegal drugs. Some items may interact with your medicine. What should I watch for while using this medication? Visit your care team regularly. Tell your care team if your symptoms do not start to get better or if they get worse. You may need blood work done while you are taking this medication. You may need to follow a special diet. Talk to your care team. Foods that contain iron include: whole grains/cereals, dried fruits,  beans, or peas, leafy green vegetables, and organ meats (liver, kidney). What side effects may I notice from receiving this medication? Side effects that you should report to your care team as soon as possible: Allergic reactions--skin rash, itching, hives, swelling of the face, lips, tongue, or throat Low blood pressure--dizziness, feeling faint or lightheaded, blurry vision Shortness of breath Side effects that usually do not require medical attention (report to your care team if they continue or are bothersome): Flushing Headache Joint pain Muscle pain Nausea Pain, redness, or irritation at injection site This list may not describe all possible side effects. Call your doctor for medical advice about side effects. You may report side effects to FDA at 1-800-FDA-1088. Where should I keep my medication? This medication is given in a hospital or clinic and will not be stored at home. NOTE: This sheet is a summary. It may not cover all possible information. If you have questions about this medicine, talk to your doctor, pharmacist, or health care provider.  2023 Elsevier/Gold Standard (2021-01-19 00:00:00)

## 2022-03-07 LAB — MISC LABCORP TEST (SEND OUT): Labcorp test code: 9985

## 2022-03-10 ENCOUNTER — Encounter (HOSPITAL_COMMUNITY): Payer: Self-pay

## 2022-03-10 ENCOUNTER — Other Ambulatory Visit: Payer: Self-pay

## 2022-03-10 ENCOUNTER — Inpatient Hospital Stay (HOSPITAL_COMMUNITY)
Admission: EM | Admit: 2022-03-10 | Discharge: 2022-03-12 | DRG: 433 | Disposition: A | Discharge status: Home / Self Care | Payer: Commercial Managed Care - HMO | Attending: Internal Medicine | Admitting: Internal Medicine

## 2022-03-10 DIAGNOSIS — D6959 Other secondary thrombocytopenia: Secondary | ICD-10-CM | POA: Diagnosis present

## 2022-03-10 DIAGNOSIS — K7460 Unspecified cirrhosis of liver: Secondary | ICD-10-CM | POA: Diagnosis present

## 2022-03-10 DIAGNOSIS — Z808 Family history of malignant neoplasm of other organs or systems: Secondary | ICD-10-CM

## 2022-03-10 DIAGNOSIS — K3189 Other diseases of stomach and duodenum: Secondary | ICD-10-CM | POA: Diagnosis present

## 2022-03-10 DIAGNOSIS — D696 Thrombocytopenia, unspecified: Secondary | ICD-10-CM | POA: Diagnosis present

## 2022-03-10 DIAGNOSIS — F1020 Alcohol dependence, uncomplicated: Secondary | ICD-10-CM | POA: Diagnosis present

## 2022-03-10 DIAGNOSIS — Z811 Family history of alcohol abuse and dependence: Secondary | ICD-10-CM

## 2022-03-10 DIAGNOSIS — R109 Unspecified abdominal pain: Secondary | ICD-10-CM | POA: Diagnosis not present

## 2022-03-10 DIAGNOSIS — K7210 Chronic hepatic failure without coma: Secondary | ICD-10-CM | POA: Diagnosis present

## 2022-03-10 DIAGNOSIS — Z801 Family history of malignant neoplasm of trachea, bronchus and lung: Secondary | ICD-10-CM

## 2022-03-10 DIAGNOSIS — K7682 Hepatic encephalopathy: Secondary | ICD-10-CM | POA: Diagnosis present

## 2022-03-10 DIAGNOSIS — G40909 Epilepsy, unspecified, not intractable, without status epilepticus: Secondary | ICD-10-CM

## 2022-03-10 DIAGNOSIS — I8510 Secondary esophageal varices without bleeding: Secondary | ICD-10-CM | POA: Diagnosis present

## 2022-03-10 DIAGNOSIS — Z888 Allergy status to other drugs, medicaments and biological substances status: Secondary | ICD-10-CM

## 2022-03-10 DIAGNOSIS — K7031 Alcoholic cirrhosis of liver with ascites: Secondary | ICD-10-CM | POA: Diagnosis not present

## 2022-03-10 DIAGNOSIS — K766 Portal hypertension: Secondary | ICD-10-CM | POA: Diagnosis present

## 2022-03-10 DIAGNOSIS — K219 Gastro-esophageal reflux disease without esophagitis: Secondary | ICD-10-CM | POA: Diagnosis present

## 2022-03-10 DIAGNOSIS — G629 Polyneuropathy, unspecified: Secondary | ICD-10-CM | POA: Diagnosis present

## 2022-03-10 DIAGNOSIS — R7989 Other specified abnormal findings of blood chemistry: Secondary | ICD-10-CM | POA: Diagnosis present

## 2022-03-10 DIAGNOSIS — F419 Anxiety disorder, unspecified: Secondary | ICD-10-CM | POA: Diagnosis present

## 2022-03-10 DIAGNOSIS — Z8619 Personal history of other infectious and parasitic diseases: Secondary | ICD-10-CM

## 2022-03-10 DIAGNOSIS — D638 Anemia in other chronic diseases classified elsewhere: Secondary | ICD-10-CM | POA: Diagnosis present

## 2022-03-10 DIAGNOSIS — Z79899 Other long term (current) drug therapy: Secondary | ICD-10-CM

## 2022-03-10 DIAGNOSIS — Z90710 Acquired absence of both cervix and uterus: Secondary | ICD-10-CM

## 2022-03-10 DIAGNOSIS — Z87442 Personal history of urinary calculi: Secondary | ICD-10-CM

## 2022-03-10 DIAGNOSIS — F10930 Alcohol use, unspecified with withdrawal, uncomplicated: Secondary | ICD-10-CM | POA: Diagnosis present

## 2022-03-10 DIAGNOSIS — F32A Depression, unspecified: Secondary | ICD-10-CM | POA: Diagnosis present

## 2022-03-10 NOTE — ED Triage Notes (Signed)
Patient BIB [facility] from home. Having abdominal swelling and bilateral leg edema since she was discharged around a week ago. Has liver cirrhosis. Had a paracentesis last time and they drained 5L.

## 2022-03-11 ENCOUNTER — Emergency Department (HOSPITAL_COMMUNITY): Payer: Commercial Managed Care - HMO

## 2022-03-11 ENCOUNTER — Other Ambulatory Visit: Payer: Self-pay

## 2022-03-11 ENCOUNTER — Inpatient Hospital Stay (HOSPITAL_COMMUNITY): Payer: Commercial Managed Care - HMO

## 2022-03-11 DIAGNOSIS — Z79899 Other long term (current) drug therapy: Secondary | ICD-10-CM | POA: Diagnosis not present

## 2022-03-11 DIAGNOSIS — F1020 Alcohol dependence, uncomplicated: Secondary | ICD-10-CM | POA: Diagnosis not present

## 2022-03-11 DIAGNOSIS — F10930 Alcohol use, unspecified with withdrawal, uncomplicated: Secondary | ICD-10-CM | POA: Diagnosis present

## 2022-03-11 DIAGNOSIS — F32A Depression, unspecified: Secondary | ICD-10-CM | POA: Diagnosis present

## 2022-03-11 DIAGNOSIS — K7210 Chronic hepatic failure without coma: Secondary | ICD-10-CM | POA: Diagnosis present

## 2022-03-11 DIAGNOSIS — Z808 Family history of malignant neoplasm of other organs or systems: Secondary | ICD-10-CM | POA: Diagnosis not present

## 2022-03-11 DIAGNOSIS — D6959 Other secondary thrombocytopenia: Secondary | ICD-10-CM | POA: Diagnosis present

## 2022-03-11 DIAGNOSIS — D638 Anemia in other chronic diseases classified elsewhere: Secondary | ICD-10-CM | POA: Diagnosis present

## 2022-03-11 DIAGNOSIS — K3189 Other diseases of stomach and duodenum: Secondary | ICD-10-CM | POA: Diagnosis present

## 2022-03-11 DIAGNOSIS — K766 Portal hypertension: Secondary | ICD-10-CM | POA: Diagnosis present

## 2022-03-11 DIAGNOSIS — G629 Polyneuropathy, unspecified: Secondary | ICD-10-CM | POA: Diagnosis present

## 2022-03-11 DIAGNOSIS — K7460 Unspecified cirrhosis of liver: Secondary | ICD-10-CM | POA: Diagnosis not present

## 2022-03-11 DIAGNOSIS — Z87442 Personal history of urinary calculi: Secondary | ICD-10-CM | POA: Diagnosis not present

## 2022-03-11 DIAGNOSIS — F419 Anxiety disorder, unspecified: Secondary | ICD-10-CM | POA: Diagnosis present

## 2022-03-11 DIAGNOSIS — R1084 Generalized abdominal pain: Secondary | ICD-10-CM | POA: Diagnosis not present

## 2022-03-11 DIAGNOSIS — R109 Unspecified abdominal pain: Secondary | ICD-10-CM | POA: Diagnosis present

## 2022-03-11 DIAGNOSIS — K7031 Alcoholic cirrhosis of liver with ascites: Secondary | ICD-10-CM

## 2022-03-11 DIAGNOSIS — Z90710 Acquired absence of both cervix and uterus: Secondary | ICD-10-CM | POA: Diagnosis not present

## 2022-03-11 DIAGNOSIS — R7989 Other specified abnormal findings of blood chemistry: Secondary | ICD-10-CM | POA: Diagnosis present

## 2022-03-11 DIAGNOSIS — Z801 Family history of malignant neoplasm of trachea, bronchus and lung: Secondary | ICD-10-CM | POA: Diagnosis not present

## 2022-03-11 DIAGNOSIS — K219 Gastro-esophageal reflux disease without esophagitis: Secondary | ICD-10-CM | POA: Diagnosis present

## 2022-03-11 DIAGNOSIS — Z888 Allergy status to other drugs, medicaments and biological substances status: Secondary | ICD-10-CM | POA: Diagnosis not present

## 2022-03-11 DIAGNOSIS — I8510 Secondary esophageal varices without bleeding: Secondary | ICD-10-CM | POA: Diagnosis present

## 2022-03-11 DIAGNOSIS — G40909 Epilepsy, unspecified, not intractable, without status epilepticus: Secondary | ICD-10-CM | POA: Diagnosis present

## 2022-03-11 DIAGNOSIS — Z811 Family history of alcohol abuse and dependence: Secondary | ICD-10-CM | POA: Diagnosis not present

## 2022-03-11 DIAGNOSIS — K7682 Hepatic encephalopathy: Secondary | ICD-10-CM | POA: Diagnosis present

## 2022-03-11 DIAGNOSIS — Z8619 Personal history of other infectious and parasitic diseases: Secondary | ICD-10-CM | POA: Diagnosis not present

## 2022-03-11 LAB — CBC WITH DIFFERENTIAL/PLATELET
Abs Immature Granulocytes: 0.05 10*3/uL (ref 0.00–0.07)
Abs Immature Granulocytes: 0.02 10*3/uL (ref 0.00–0.07)
Basophils Absolute: 0.1 10*3/uL (ref 0.0–0.1)
Basophils Absolute: 0.1 10*3/uL (ref 0.0–0.1)
Basophils Relative: 1 %
Basophils Relative: 1 %
Eosinophils Absolute: 0.1 10*3/uL (ref 0.0–0.5)
Eosinophils Absolute: 0.1 10*3/uL (ref 0.0–0.5)
Eosinophils Relative: 1 %
Eosinophils Relative: 3 %
HCT: 36.2 % (ref 36.0–46.0)
HCT: 32.9 % — ABNORMAL LOW (ref 36.0–46.0)
Hemoglobin: 10.6 g/dL — ABNORMAL LOW (ref 12.0–15.0)
Hemoglobin: 9.6 g/dL — ABNORMAL LOW (ref 12.0–15.0)
Immature Granulocytes: 1 %
Immature Granulocytes: 0 %
Lymphocytes Relative: 11 %
Lymphocytes Relative: 15 %
Lymphs Abs: 0.9 10*3/uL (ref 0.7–4.0)
Lymphs Abs: 0.8 10*3/uL (ref 0.7–4.0)
MCH: 26.4 pg (ref 26.0–34.0)
MCH: 26.7 pg (ref 26.0–34.0)
MCHC: 29.3 g/dL — ABNORMAL LOW (ref 30.0–36.0)
MCHC: 29.2 g/dL — ABNORMAL LOW (ref 30.0–36.0)
MCV: 90.3 fL (ref 80.0–100.0)
MCV: 91.4 fL (ref 80.0–100.0)
Monocytes Absolute: 0.9 10*3/uL (ref 0.1–1.0)
Monocytes Absolute: 0.7 10*3/uL (ref 0.1–1.0)
Monocytes Relative: 12 %
Monocytes Relative: 13 %
Neutro Abs: 5.9 10*3/uL (ref 1.7–7.7)
Neutro Abs: 3.8 10*3/uL (ref 1.7–7.7)
Neutrophils Relative %: 74 %
Neutrophils Relative %: 68 %
Platelets: 97 10*3/uL — ABNORMAL LOW (ref 150–400)
Platelets: 82 10*3/uL — ABNORMAL LOW (ref 150–400)
RBC: 4.01 MIL/uL (ref 3.87–5.11)
RBC: 3.6 MIL/uL — ABNORMAL LOW (ref 3.87–5.11)
RDW: 27.6 % — ABNORMAL HIGH (ref 11.5–15.5)
RDW: 27.7 % — ABNORMAL HIGH (ref 11.5–15.5)
WBC: 7.9 10*3/uL (ref 4.0–10.5)
WBC: 5.6 10*3/uL (ref 4.0–10.5)
nRBC: 0 % (ref 0.0–0.2)
nRBC: 0 % (ref 0.0–0.2)

## 2022-03-11 LAB — GRAM STAIN

## 2022-03-11 LAB — COMPREHENSIVE METABOLIC PANEL
ALT: 33 U/L (ref 0–44)
ALT: 40 U/L (ref 0–44)
AST: 69 U/L — ABNORMAL HIGH (ref 15–41)
AST: 71 U/L — ABNORMAL HIGH (ref 15–41)
Albumin: 2.5 g/dL — ABNORMAL LOW (ref 3.5–5.0)
Albumin: 2.7 g/dL — ABNORMAL LOW (ref 3.5–5.0)
Alkaline Phosphatase: 181 U/L — ABNORMAL HIGH (ref 38–126)
Alkaline Phosphatase: 230 U/L — ABNORMAL HIGH (ref 38–126)
Anion gap: 7 (ref 5–15)
Anion gap: 8 (ref 5–15)
BUN: 6 mg/dL (ref 6–20)
BUN: 6 mg/dL (ref 6–20)
CO2: 23 mmol/L (ref 22–32)
CO2: 23 mmol/L (ref 22–32)
Calcium: 7.6 mg/dL — ABNORMAL LOW (ref 8.9–10.3)
Calcium: 8.2 mg/dL — ABNORMAL LOW (ref 8.9–10.3)
Chloride: 106 mmol/L (ref 98–111)
Chloride: 109 mmol/L (ref 98–111)
Creatinine, Ser: 0.44 mg/dL (ref 0.44–1.00)
Creatinine, Ser: 0.52 mg/dL (ref 0.44–1.00)
GFR, Estimated: >60 mL/min (ref >60)
GFR, Estimated: >60 mL/min (ref >60)
Glucose, Bld: 112 mg/dL — ABNORMAL HIGH (ref 70–99)
Glucose, Bld: 91 mg/dL (ref 70–99)
Potassium: 3.7 mmol/L (ref 3.5–5.1)
Potassium: 3.8 mmol/L (ref 3.5–5.1)
Sodium: 137 mmol/L (ref 135–145)
Sodium: 139 mmol/L (ref 135–145)
Total Bilirubin: 1.9 mg/dL — ABNORMAL HIGH (ref 0.3–1.2)
Total Bilirubin: 2.7 mg/dL — ABNORMAL HIGH (ref 0.3–1.2)
Total Protein: 5.8 g/dL — ABNORMAL LOW (ref 6.5–8.1)
Total Protein: 6.7 g/dL (ref 6.5–8.1)

## 2022-03-11 LAB — BODY FLUID CELL COUNT WITH DIFFERENTIAL
Eos, Fluid: 0 %
Lymphs, Fluid: 22 %
Monocyte-Macrophage-Serous Fluid: 77 % (ref 50–90)
Neutrophil Count, Fluid: 1 % (ref 0–25)
Total Nucleated Cell Count, Fluid: 151 cu mm (ref 0–1000)

## 2022-03-11 LAB — LIPASE, BLOOD: Lipase: 54 U/L — ABNORMAL HIGH (ref 11–51)

## 2022-03-11 LAB — MRSA NEXT GEN BY PCR, NASAL: MRSA by PCR Next Gen: NOT DETECTED

## 2022-03-11 LAB — MAGNESIUM: Magnesium: 1.9 mg/dL (ref 1.7–2.4)

## 2022-03-11 LAB — GLUCOSE, PLEURAL OR PERITONEAL FLUID: Glucose, Fluid: 96 mg/dL

## 2022-03-11 LAB — BRAIN NATRIURETIC PEPTIDE: B Natriuretic Peptide: 16.6 pg/mL (ref 0.0–100.0)

## 2022-03-11 LAB — AMMONIA: Ammonia: 64 umol/L — ABNORMAL HIGH (ref 9–35)

## 2022-03-11 LAB — LACTATE DEHYDROGENASE, PLEURAL OR PERITONEAL FLUID: LD, Fluid: 25 U/L — ABNORMAL HIGH (ref 3–23)

## 2022-03-11 LAB — PROTIME-INR
INR: 1.3 — ABNORMAL HIGH (ref 0.8–1.2)
Prothrombin Time: 15.9 seconds — ABNORMAL HIGH (ref 11.4–15.2)

## 2022-03-11 LAB — ALBUMIN, PLEURAL OR PERITONEAL FLUID: Albumin, Fluid: <1.5 g/dL

## 2022-03-11 LAB — ETHANOL: Alcohol, Ethyl (B): 139 mg/dL — ABNORMAL HIGH (ref <10)

## 2022-03-11 LAB — PHOSPHORUS: Phosphorus: 3.1 mg/dL (ref 2.5–4.6)

## 2022-03-11 MED ORDER — LORAZEPAM 2 MG/ML IJ SOLN
0.0000 mg | Freq: Four times a day (QID) | INTRAMUSCULAR | Status: DC
  Administered 2022-03-11: 1 mg via INTRAVENOUS
  Administered 2022-03-11 (×2): 2 mg via INTRAVENOUS
  Administered 2022-03-11: 1 mg via INTRAVENOUS
  Filled 2022-03-11 (×4): qty 1

## 2022-03-11 MED ORDER — LACTULOSE 10 GM/15ML PO SOLN
20.0000 g | Freq: Three times a day (TID) | ORAL | Status: DC
  Administered 2022-03-11 – 2022-03-12 (×3): 20 g via ORAL
  Filled 2022-03-11 (×3): qty 30

## 2022-03-11 MED ORDER — LEVETIRACETAM 500 MG PO TABS
750.0000 mg | ORAL_TABLET | Freq: Two times a day (BID) | ORAL | Status: DC
  Administered 2022-03-11 – 2022-03-12 (×3): 750 mg via ORAL
  Filled 2022-03-11 (×3): qty 1

## 2022-03-11 MED ORDER — ACETAMINOPHEN 325 MG PO TABS: 325.0000 mg | ORAL_TABLET | Freq: Four times a day (QID) | ORAL | Status: DC | PRN

## 2022-03-11 MED ORDER — HYDROMORPHONE HCL 1 MG/ML IJ SOLN: 0.5000 mg | INTRAMUSCULAR | Status: DC | PRN

## 2022-03-11 MED ORDER — ORAL CARE MOUTH RINSE: 15.0000 mL | OROMUCOSAL | Status: DC | PRN

## 2022-03-11 MED ORDER — CHLORHEXIDINE GLUCONATE CLOTH 2 % EX PADS
6.0000 | MEDICATED_PAD | Freq: Every day | CUTANEOUS | Status: DC
  Administered 2022-03-11: 6 via TOPICAL

## 2022-03-11 MED ORDER — ARIPIPRAZOLE 10 MG PO TABS
10.0000 mg | ORAL_TABLET | Freq: Every morning | ORAL | Status: DC
  Administered 2022-03-11 – 2022-03-12 (×2): 10 mg via ORAL
  Filled 2022-03-11 (×2): qty 1

## 2022-03-11 MED ORDER — LIDOCAINE HCL 1 % IJ SOLN
INTRAMUSCULAR | Status: AC
  Administered 2022-03-11: 10 mL
  Filled 2022-03-11: qty 20

## 2022-03-11 MED ORDER — THIAMINE HCL 100 MG/ML IJ SOLN
100.0000 mg | Freq: Every day | INTRAMUSCULAR | Status: DC
  Administered 2022-03-11: 100 mg via INTRAVENOUS
  Filled 2022-03-11: qty 2

## 2022-03-11 MED ORDER — ALBUMIN HUMAN 25 % IV SOLN
12.5000 g | Freq: Four times a day (QID) | INTRAVENOUS | Status: AC
  Administered 2022-03-11 (×3): 12.5 g via INTRAVENOUS
  Filled 2022-03-11 (×3): qty 50

## 2022-03-11 MED ORDER — ORAL CARE MOUTH RINSE
15.0000 mL | OROMUCOSAL | Status: DC | PRN
Start: 2022-03-11 — End: 2022-03-11

## 2022-03-11 MED ORDER — THIAMINE HCL 100 MG PO TABS
100.0000 mg | ORAL_TABLET | Freq: Every day | ORAL | Status: DC
  Administered 2022-03-12: 100 mg via ORAL
  Filled 2022-03-11 (×2): qty 1

## 2022-03-11 MED ORDER — ENOXAPARIN SODIUM 40 MG/0.4ML IJ SOSY
40.0000 mg | PREFILLED_SYRINGE | INTRAMUSCULAR | Status: DC
  Administered 2022-03-11 – 2022-03-12 (×2): 40 mg via SUBCUTANEOUS
  Filled 2022-03-11 (×2): qty 0.4

## 2022-03-11 MED ORDER — OXYCODONE HCL 5 MG PO TABS: 5.0000 mg | ORAL_TABLET | Freq: Four times a day (QID) | ORAL | Status: DC | PRN

## 2022-03-11 MED ORDER — CLONAZEPAM 0.5 MG PO TABS
0.5000 mg | ORAL_TABLET | Freq: Two times a day (BID) | ORAL | Status: DC | PRN
  Administered 2022-03-11 (×2): 0.5 mg via ORAL
  Filled 2022-03-11 (×2): qty 1

## 2022-03-11 MED ORDER — LORAZEPAM 1 MG PO TABS: 0.0000 mg | ORAL_TABLET | Freq: Two times a day (BID) | ORAL | Status: DC

## 2022-03-11 MED ORDER — SODIUM CHLORIDE 0.9 % IV SOLN
2.0000 g | Freq: Every day | INTRAVENOUS | Status: DC
  Administered 2022-03-11 – 2022-03-12 (×2): 2 g via INTRAVENOUS
  Filled 2022-03-11 (×2): qty 20

## 2022-03-11 MED ORDER — SPIRONOLACTONE 25 MG PO TABS
100.0000 mg | ORAL_TABLET | Freq: Every day | ORAL | Status: DC
  Administered 2022-03-11 – 2022-03-12 (×2): 100 mg via ORAL
  Filled 2022-03-11 (×2): qty 4

## 2022-03-11 MED ORDER — FUROSEMIDE 40 MG PO TABS
40.0000 mg | ORAL_TABLET | Freq: Every day | ORAL | Status: DC
  Administered 2022-03-11 – 2022-03-12 (×2): 40 mg via ORAL
  Filled 2022-03-11 (×2): qty 1

## 2022-03-11 MED ORDER — ONDANSETRON HCL 4 MG/2ML IJ SOLN
4.0000 mg | Freq: Once | INTRAMUSCULAR | Status: AC
  Administered 2022-03-11: 4 mg via INTRAVENOUS
  Filled 2022-03-11: qty 2

## 2022-03-11 MED ORDER — NADOLOL 20 MG PO TABS
20.0000 mg | ORAL_TABLET | Freq: Every morning | ORAL | Status: DC
  Administered 2022-03-11 – 2022-03-12 (×2): 20 mg via ORAL
  Filled 2022-03-11 (×2): qty 1

## 2022-03-11 MED ORDER — MELATONIN 5 MG PO TABS
5.0000 mg | ORAL_TABLET | Freq: Every evening | ORAL | Status: DC | PRN
  Administered 2022-03-11: 5 mg via ORAL
  Filled 2022-03-11: qty 1

## 2022-03-11 MED ORDER — LORAZEPAM 2 MG/ML IJ SOLN: 0.0000 mg | Freq: Two times a day (BID) | INTRAMUSCULAR | Status: DC

## 2022-03-11 MED ORDER — PANTOPRAZOLE SODIUM 40 MG PO TBEC
40.0000 mg | DELAYED_RELEASE_TABLET | Freq: Every morning | ORAL | Status: DC
  Administered 2022-03-11 – 2022-03-12 (×2): 40 mg via ORAL
  Filled 2022-03-11 (×2): qty 1

## 2022-03-11 MED ORDER — POLYETHYLENE GLYCOL 3350 17 G PO PACK: 17.0000 g | PACK | Freq: Every day | ORAL | Status: DC | PRN

## 2022-03-11 MED ORDER — LORAZEPAM 2 MG/ML IJ SOLN: 1.0000 mg | Freq: Once | INTRAMUSCULAR | Status: DC

## 2022-03-11 MED ORDER — LORAZEPAM 1 MG PO TABS
0.0000 mg | ORAL_TABLET | Freq: Four times a day (QID) | ORAL | Status: DC
  Administered 2022-03-12: 1 mg via ORAL
  Filled 2022-03-11: qty 1

## 2022-03-11 MED ORDER — ORAL CARE MOUTH RINSE
15.0000 mL | OROMUCOSAL | Status: DC
  Administered 2022-03-11: 15 mL via OROMUCOSAL

## 2022-03-11 MED ORDER — LORAZEPAM 2 MG/ML IJ SOLN
1.0000 mg | Freq: Once | INTRAMUSCULAR | Status: DC
  Filled 2022-03-11: qty 1

## 2022-03-11 MED ORDER — FLUOXETINE HCL 20 MG PO CAPS
40.0000 mg | ORAL_CAPSULE | Freq: Every morning | ORAL | Status: DC
  Administered 2022-03-11 – 2022-03-12 (×2): 40 mg via ORAL
  Filled 2022-03-11 (×2): qty 2

## 2022-03-11 NOTE — ED Notes (Signed)
Pt states that her last alcoholic drink was 01/08/5893 AM. Pt states that she drinks a 6 pack a day.

## 2022-03-11 NOTE — Progress Notes (Signed)
Patient admitted this morning due to nausea, vomiting and abdominal pain from decompensated liver cirrhosis with ascites.  IR consult has been requested for paracentesis.  Patient seen and examined at bedside.  Plan of care discussed with her.  Repeat a.m. labs.

## 2022-03-11 NOTE — ED Provider Notes (Signed)
[facility] DEPT Provider Note   CSN: [id number] Arrival date & time: 03/10/22  2321     History  Chief Complaint  Patient presents with   Abdominal Pain    Briana Sweeney is a 51 y.o. female.  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 difficile finished outpatient vancomycin presents to the emergency department complaints of leg swelling and stomach distention.  Patient she noted leg swelling started about 3 days ago, states her legs feel very tight, she denies any calf tenderness, no history of PEs or DVTs currently on hormone therapy denies any chest pain or shortness of breath.  She also notes that she is having stomach pain pain is all around, states she feels as if her stomach is going to pop, states that she has associated nausea and vomiting denies hematemesis or coffee-ground emesis, states she is still passing gas and having bowel movements.  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  She feels slightly anxious denies any suicidal Holmes ideations.  Reviewed patient's chart has been admitted numerous times for worsening ascites, most recent admission was on 06/21 discharged on the 25th had about 3.4 L of clear fluid removed on the 22nd, negative for SBP, started on Librium taper, noted colitis seen on CT imaging with confirmed C. difficile positive stool sample on vancomycin and then discharged on 7 days of oral vancomycin.   Home Medications Prior to Admission medications   Medication Sig Start Date End Date Taking? Authorizing Provider  ARIPiprazole (ABILIFY) 10 MG tablet Take 1 tablet by mouth every day Patient taking differently: Take 10 mg by mouth  every morning. 11/22/21  Yes   clonazePAM (KLONOPIN) 0.5 MG tablet TAKE 1 TABLET BY MOUTH TWICE DAILY Patient taking differently: Take 0.5 mg by mouth 2 (two) times daily as needed for anxiety. 02/14/22  Yes   FLUoxetine (PROZAC) 40 MG capsule Take 1 capsule (40 mg total) by mouth daily. Patient taking differently: Take 40 mg by mouth every morning. 11/22/21  Yes   furosemide (LASIX) 40 MG tablet Take 40 mg by mouth daily.   Yes [provider]  lactulose (CHRONULAC) 10 GM/15ML solution Take 30 mLs (20 g total) by mouth 3 (three) times daily. 01/24/22  Yes Aline August, MD  levETIRAcetam (KEPPRA) 750 MG tablet Take 1 tablet (750 mg total) by mouth 2 (two) times daily. 07/06/21  Yes Cameron Sprang, MD  nadolol (CORGARD) 20 MG tablet Take 1 tablet (20 mg total) by mouth every morning. 02/03/22 03/11/22 Yes Spongberg, Audie Pinto, MD  OVER THE COUNTER MEDICATION Take 1 capsule by mouth daily. OLLY Ultra brain soft gel   Yes [provider]  pantoprazole (PROTONIX) 40 MG tablet Take 1 tablet (40 mg total) by mouth every morning. 01/24/22  Yes Aline August, MD  spironolactone (ALDACTONE) 50 MG tablet Take 3 tablets (150 mg total) by mouth daily. Patient taking differently: Take 50 mg by mouth daily. 02/03/22 03/11/22 Yes Spongberg, Audie Pinto, MD  vancomycin (VANCOCIN) 125 MG capsule Take 1 capsule by mouth 4 times daily for 7 days. 03/03/22 03/13/22 Yes Pahwani, Einar Grad, MD  predniSONE (DELTASONE) 20 MG tablet Take 1 tablet (20 mg total) by mouth 2 (two) times daily. Patient  not taking: Reported on 03/11/2022 02/02/22 05/03/22  Marcell Anger, MD      Allergies    Other    Review of Systems   Review of Systems  Constitutional:  Negative for chills and fever.  Respiratory:  Negative for shortness of breath.   Cardiovascular:  Negative for chest pain.  Gastrointestinal:  Positive for abdominal distention, abdominal pain, nausea and vomiting. Negative for constipation.   Neurological:  Negative for headaches.    Physical Exam Updated Vital Signs BP 126/78   Pulse (!) 107   Temp 98 F (36.7 C) (Oral)   Resp 17   Ht $R'4\' 11"'YY$  (1.499 m)   Wt 65.8 kg   LMP  (LMP Unknown)   SpO2 93%   BMI 29.29 kg/m  Physical Exam Vitals and nursing note reviewed.  Constitutional:      General: She is not in acute distress.    Appearance: She is not ill-appearing.  HENT:     Head: Normocephalic and atraumatic.     Nose: No congestion.  Eyes:     Conjunctiva/sclera: Conjunctivae normal.  Cardiovascular:     Rate and Rhythm: Normal rate and regular rhythm.     Pulses: Normal pulses.     Heart sounds: No murmur heard.    No friction rub. No gallop.  Pulmonary:     Effort: No respiratory distress.     Breath sounds: Rales present. No wheezing or rhonchi.     Comments: No evidence of respiratory distress nontachypneic nonhypoxic speaking full sentences, noted Rales in left lower lobe Abdominal:     General: There is distension.     Palpations: Abdomen is soft.     Tenderness: There is abdominal tenderness. There is no right CVA tenderness or left CVA tenderness.     Comments: Abdomen notably distended, normal bowel sounds dull to percussion, without guarding rebound tenderness or peritoneal sign she had generalized tenderness.  Positive fluid wave.  Musculoskeletal:     Right lower leg: Edema present.     Left lower leg: Edema present.     Comments: 2+ pitting edema lower extremities bilaterally no overlying skin changes 2+ dorsal pedal pulses.  Skin:    General: Skin is warm and dry.  Neurological:     Mental Status: She is alert.  Psychiatric:        Mood and Affect: Mood normal.     Comments: Denies suicidal or homicidal ideations, does not appear to be responding internal stimuli, she is tremulous on my exam tachycardic endorses that she is anxious     ED Results / Procedures / Treatments   Labs (all labs ordered are listed, but only abnormal results  are displayed) Labs Reviewed  CBC WITH DIFFERENTIAL/PLATELET - Abnormal; Notable for the following components:      Result Value   Hemoglobin 10.6 (*)    MCHC 29.3 (*)    RDW 27.6 (*)    Platelets 97 (*)    All other components within normal limits  COMPREHENSIVE METABOLIC PANEL - Abnormal; Notable for the following components:   Glucose, Bld 112 (*)    Calcium 8.2 (*)    Albumin 2.7 (*)    AST 69 (*)    Alkaline Phosphatase 230 (*)    Total Bilirubin 2.7 (*)    All other components within normal limits  LIPASE, BLOOD - Abnormal; Notable for the following components:   Lipase 54 (*)    All other components within normal limits  ETHANOL - Abnormal; Notable for the following components:   Alcohol, Ethyl (B) 139 (*)    All other components within normal limits  AMMONIA - Abnormal; Notable for the following components:   Ammonia 64 (*)    All other components within normal limits  PROTIME-INR - Abnormal; Notable for the following components:   Prothrombin Time 15.9 (*)    INR 1.3 (*)    All other components within normal limits  BODY FLUID CULTURE W GRAM STAIN  BRAIN NATRIURETIC PEPTIDE  URINALYSIS, ROUTINE W REFLEX MICROSCOPIC  LACTATE DEHYDROGENASE, PLEURAL OR PERITONEAL FLUID  GLUCOSE, PLEURAL OR PERITONEAL FLUID  PROTEIN, PLEURAL OR PERITONEAL FLUID  ALBUMIN, PLEURAL OR PERITONEAL FLUID     EKG EKG Interpretation  Date/Time:  Sunday March 10 2022 23:59:40 EDT Ventricular Rate:  107 PR Interval:  146 QRS Duration: 88 QT Interval:  341 QTC Calculation: 455 R Axis:   77 Text Interpretation: Sinus tachycardia Confirmed by Kommor, [name] ([id number]) on 03/11/2022 1:09:33 AM  Radiology DG Abdomen Acute W/Chest  Result Date: 03/11/2022 CLINICAL DATA:  Abdominal distension and leg edema EXAM: DG ABDOMEN ACUTE WITH 1 VIEW CHEST COMPARISON:  02/28/2022 FINDINGS: Cardiac shadow is within normal limits. Lungs are well aerated bilaterally. Changes of prior granulomatous disease are  seen. Scattered large and small bowel gas is noted. No obstructive changes are seen. Mild retained fecal material is noted in the left colon. No free air is noted. No bony abnormality is seen. IMPRESSION: No acute abnormality noted. Electronically Signed   By: Inez Catalina M.D.   On: 03/11/2022 01:47    Procedures Procedures    Medications Ordered in ED Medications  LORazepam (ATIVAN) injection 0-4 mg (1 mg Intravenous Given 03/11/22 0049)    Or  LORazepam (ATIVAN) tablet 0-4 mg ( Oral See Alternative 03/11/22 0049)  LORazepam (ATIVAN) injection 0-4 mg (has no administration in time range)    Or  LORazepam (ATIVAN) tablet 0-4 mg (has no administration in time range)  thiamine tablet 100 mg (has no administration in time range)    Or  thiamine (B-1) injection 100 mg (has no administration in time range)  LORazepam (ATIVAN) injection 1 mg (0 mg Intravenous Hold 03/11/22 0245)  ondansetron (ZOFRAN) injection 4 mg (4 mg Intravenous Given 03/11/22 0048)    ED Course/ Medical Decision Making/ A&P                           Medical Decision Making Amount and/or Complexity of Data Reviewed Labs: ordered. Radiology: ordered.  Risk OTC drugs. Prescription drug management. Decision regarding hospitalization.   This patient presents to the ED for concern of abdominal pain, this involves an extensive number of treatment options, and is a complaint that carries with it a high risk of complications and morbidity.  The differential diagnosis includes acute chronic ascites, SBP, bowel obstruction, diverticulitis, CHF    Additional history obtained:  Additional history obtained from N/A External records from outside source obtained and reviewed including previous discharge summary   Co morbidities that complicate the patient evaluation  Cirrhosis, alcohol dependency  Social Determinants of Health:  N/A    Lab Tests:  I Ordered, and personally interpreted labs.  The pertinent results  include: CBC shows normocytic anemia hemoglobin 10.6, platelets of 97 at her baseline, glucose 112 calcium 8.2 albumin 2.7 AST 69 alk phos 230 T. bili 2.7, lipase is 54 ethanol 139 prothrombin time 15, INR 1.3   Imaging  Studies ordered:  I ordered imaging studies including acute chest abdomen I independently visualized and interpreted imaging which showed negative acute findings I agree with the radiologist interpretation   Cardiac Monitoring:  The patient was maintained on a cardiac monitor.  I personally viewed and interpreted the cardiac monitored which showed an underlying rhythm of: Unremarkable   Medicines ordered and prescription drug management:  I ordered medication including Ativan I have reviewed the patients home medicines and have made adjustments as needed  Critical Interventions:  N/A   Reevaluation:  Presents with abdominal distention, likely secondary due to ascites, she seems tremulous on my exam, tachycardic, suspect from alcohol withdrawals, will initiate CIWA protocol, obtain basic lab work-up and reevaluate.  The patient with ultrasound probe as she does have noted fluid within the abdomen but there is not a large pocket of fluid for bedside paracentesis to be performed, she also has platelet count of 97 will defer.  Patient was reassessed she continues to be tachycardic, likely from withdrawals she also needs paracentesis, will recommend admission patient agreed this plan.    Consultations Obtained:  I requested consultation with the hospitalist Dr. Nevada Crane,  and discussed lab and imaging findings as well as pertinent plan - they recommend:  will admit the patient    Test Considered:  CT on pelvis-this we deferred my suspicion for intra-abdominal abnormality is lower at this time, she has no leukocytosis no significant changes in her metabolic panel, CT scan performed on 22nd was unremarkable.    Rule out low suspicion for lower lobe pneumonia as  lung sounds are clear bilaterally, will defer imaging at this time.  I have low suspicion for liver or gallbladder abnormality as she has no right upper quadrant tenderness, liver enzymes, alk phos, T bili all at baseline for patient.  They are notably elevated this is likely secondary due to cirrhosis as well as alcohol dependency.  Low suspicion for pancreatitis as lipase is within normal limits.  Low suspicion for ruptured stomach ulcer as she has no peritoneal sign present on exam.  Low suspicion for bowel obstruction as abdomen normal bowel sounds, so passing gas and having normal bowel movements.  Patient abdomen is distended but this is likely from ascites which I was able to see on ultrasound.  Low suspicion for complicated diverticulitis as she is nontoxic-appearing, vital signs reassuring no leukocytosis present.  I suspicion for SBP is lower at this time afebrile, no leukocytosis, I think tachycardia is again more attributed to withdrawals.  Low suspicion for appendicitis as she has no right lower quadrant tenderness, vital signs reassuring.  I have low suspicion for intra-abdominal infection as she has low risk factors, vital signs reassuring, no leukocytosis, will defer imaging at this time.     Dispostion and problem list  After consideration of the diagnostic results and the patients response to treatment, I feel that the patent would benefit from admission.  Withdrawals-currently on CIWA has been given Ativan but still is tachycardic, no DTs present will need continued monitoring. Ascites-patient would benefit from paracentesis, generally gets this from IR.           Final Clinical Impression(s) / ED Diagnoses Final diagnoses:  Alcohol withdrawal syndrome without complication ([place])  Ascites due to alcoholic cirrhosis [facility])    Rx / DC Orders ED Discharge Orders     None         Marcello Fennel, PA-C 03/11/22 0256    Teressa Lower, MD 03/11/22 [phone]

## 2022-03-11 NOTE — Procedures (Addendum)
PROCEDURE SUMMARY:  Successful image-guided paracentesis from the right lower abdomen.  Yielded 1.2 liters of thin, yellow fluid.  No immediate complications.  EBL = trace. Patient tolerated well.   Specimen was  sent for labs.  Please see imaging section of Epic for full dictation.  Per Dr Serafina Royals on 12/16/20:  Assessment: Briana Sweeney is a 51 y.o. female with history of alcoholic cirrhosis and recurrent ascites (Child Pugh B, MELD 17).  After preliminary evaluation, this patient may potentially benefit from TIPS creation, however appears to be suboptimally followed by Gastroenterologist (multiple mentions of following up with GI as outpatient, but none documented), and has room increase her current regimen of diuretics.  She is at high risk of recurrent esophageal variceal hemorrhage given large varices on most recent CT.   Recommendation: Formal consult for TIPS could be considered, however only after establishing routine care with Gastroentology.  Dr. Lyndel Safe, who performed her most recent EGD, will be contacted for further discussion.  Lura Em PA-C 03/11/2022 1:27 PM

## 2022-03-11 NOTE — H&P (Signed)
History and Physical  Briana Sweeney BSJ:[id number] DOB: 15-Sep-1970 DOA: 03/10/2022  Referring physician: Carlis Stable  PCP: Marco Collie, MD  Outpatient Specialists: GI Patient coming from: Home.  Chief Complaint: Nausea, vomiting, abdominal pain  HPI: Briana Sweeney is a 51 y.o. 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  Associated with bilateral lower extremity edema.  No tenderness.  No prior history of PE or DVT.  Denies subjective fevers.  No overt bleeding.    Upon presentation to the ED, the patient was found to be tachycardic with elevated ammonia level 64, elevated liver chemistries with moderately distended abdomen.  IR consulted for possible paracentesis in the morning.  The patient received a dose of IV Zofran 4 mg x 1.  TRH, hospitalist service, was asked to admit.  ED Course: Tmax 98.  BP 110/79, pulse 110, respiration rate 16, O2 saturation 93% on room air.  Lab studies remarkable for serum calcium 8.2, albumin 2.7, alkaline phosphatase 230, AST 69.  Lipase 54.  Total bilirubin 2.7.  Ammonia 64.  Hemoglobin 10.6, platelet count 97.  Review of Systems: Review of systems as noted in the HPI. All other systems reviewed and are negative.   Past Medical History:  Diagnosis Date   Abscess of bursa of left elbow 09/23/2018   Acute hyperactive alcohol withdrawal delirium ([place]) 10/09/2017   Acute low back pain with bilateral sciatica    Acute metabolic encephalopathy 76/54/6503   Acute on chronic pancreatitis ([place]) 04/04/2021   AKI (acute kidney injury) ([place]) 04/04/2021   Alcohol withdrawal syndrome with complication ([place]) 54/65/6812    Alcohol-induced acute pancreatitis    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[place]) 12/05/2018   Ascites due to alcoholic cirrhosis ([place])    Bipolar affective disorder ([place])    With anxiety features   Bunionette of right foot 11/2019   Cirrhosis of liver ([place])    Due to alcohol and hepatitis C   Cocaine dependence without complication ([place]) 75/17/0017   Decompensated hepatic cirrhosis ([place]) 07/23/2017   Depression with anxiety    Epistaxis 01/20/2019   Erysipelas 09/13/2018   Esophageal 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 ([place])    Esophageal varices without bleeding (HCC)    ETOHism (HCC)    GERD (gastroesophageal reflux disease)    Head injury    Hematemesis 02/10/2018   Hepatitis C 2018   hepatitis c and alcohol related hepatitis   Heroin abuse ([place])    History of blood transfusion    "blood doesn't clot; I fell down and had to have a transfusion"   History of kidney stones    Menopause 2016   Migraine    "when I get really stressed" (09/01/2017)   Neuropathy 10/27/2018   Olecranon bursitis of left elbow    Other mixed anxiety disorders 10/27/2018   Overdose 08/22/2018   Pancytopenia ([place]) 07/23/2017   Polysubstance abuse ([place]) 04/08/2018   Portal hypertension ([place]) 03/18/2018   S/P foot surgery, right 11/2019   Schizophrenia ([place])    Seizures ([place])    "when I run out of my RX; lots recently" (09/01/2017)   Suicidal ideation    Thrombocytopenia ([place])    Trichimoniasis 10/30/2017   Upper GI bleed 02/10/2018   Urinary urgency 05/2020   UTI (urinary tract infection) 06/02/2017  Past Surgical History:  Procedure Laterality Date   BUNIONECTOMY     ESOPHAGEAL BANDING  06/26/2021   Procedure: ESOPHAGEAL BANDING;  Surgeon: Jackquline Denmark, MD;  Location: [facility] ENDOSCOPY;  Service: Endoscopy;;   ESOPHAGOGASTRODUODENOSCOPY N/A 09/03/2017   Procedure: ESOPHAGOGASTRODUODENOSCOPY (EGD);  Surgeon: Doran Stabler, MD;  Location: [facility];  Service: Gastroenterology;  Laterality: N/A;    ESOPHAGOGASTRODUODENOSCOPY N/A 02/01/2022   Procedure: ESOPHAGOGASTRODUODENOSCOPY (EGD);  Surgeon: Clarene Essex, MD;  Location: [facility] ENDOSCOPY;  Service: Gastroenterology;  Laterality: N/A;   ESOPHAGOGASTRODUODENOSCOPY (EGD) WITH PROPOFOL N/A 06/26/2021   Procedure: ESOPHAGOGASTRODUODENOSCOPY (EGD) WITH PROPOFOL;  Surgeon: Jackquline Denmark, MD;  Location: [facility] ENDOSCOPY;  Service: Endoscopy;  Laterality: N/A;   FINGER FRACTURE SURGERY Left    "shattered my pinky"   FRACTURE SURGERY     I & D EXTREMITY Left 09/18/2018   Procedure: IRRIGATION AND DEBRIDEMENT EXTREMITY;  Surgeon: Leanora Cover, MD;  Location: [facility] ORS;  Service: Orthopedics;  Laterality: Left;   IR PARACENTESIS  07/23/2017   IR PARACENTESIS  07/2017   "did it twice in the same week" (09/01/2017)   IR PARACENTESIS  06/26/2021   SHOULDER OPEN ROTATOR CUFF REPAIR Right    TOOTH EXTRACTION  08/2019   TUBAL LIGATION     VAGINAL HYSTERECTOMY      Social History:  reports that she has never smoked. She has never used smokeless tobacco. She reports current alcohol use of about 6.0 standard drinks of alcohol per week. She reports that she does not currently use drugs.   Allergies  Allergen Reactions   Other Other (See Comments)    Platelets: Rx chest pain, tremors, body aches    Family History  Problem Relation Age of Onset   Lung cancer Mother 39   Alcohol abuse Mother    Throat cancer Father 45      Prior to Admission medications   Medication Sig Start Date End Date Taking? Authorizing Provider  ARIPiprazole (ABILIFY) 10 MG tablet Take 1 tablet by mouth every day Patient taking differently: Take 10 mg by mouth every morning. 11/22/21  Yes   clonazePAM (KLONOPIN) 0.5 MG tablet TAKE 1 TABLET BY MOUTH TWICE DAILY Patient taking differently: Take 0.5 mg by mouth 2 (two) times daily as needed for anxiety. 02/14/22  Yes   FLUoxetine (PROZAC) 40 MG capsule Take 1 capsule (40 mg total) by mouth daily. Patient taking differently: Take 40 mg  by mouth every morning. 11/22/21  Yes   furosemide (LASIX) 40 MG tablet Take 40 mg by mouth daily.   Yes [provider]  lactulose (CHRONULAC) 10 GM/15ML solution Take 30 mLs (20 g total) by mouth 3 (three) times daily. 01/24/22  Yes Aline August, MD  levETIRAcetam (KEPPRA) 750 MG tablet Take 1 tablet (750 mg total) by mouth 2 (two) times daily. 07/06/21  Yes Cameron Sprang, MD  nadolol (CORGARD) 20 MG tablet Take 1 tablet (20 mg total) by mouth every morning. 02/03/22 03/11/22 Yes Spongberg, Audie Pinto, MD  OVER THE COUNTER MEDICATION Take 1 capsule by mouth daily. OLLY Ultra brain soft gel   Yes [provider]  pantoprazole (PROTONIX) 40 MG tablet Take 1 tablet (40 mg total) by mouth every morning. 01/24/22  Yes Aline August, MD  spironolactone (ALDACTONE) 50 MG tablet Take 3 tablets (150 mg total) by mouth daily. Patient taking differently: Take 50 mg by mouth daily. 02/03/22 03/11/22 Yes Spongberg, Audie Pinto, MD  vancomycin (VANCOCIN) 125 MG capsule Take 1 capsule by mouth 4  times daily for 7 days. 03/03/22 03/13/22 Yes Pahwani, Einar Grad, MD  predniSONE (DELTASONE) 20 MG tablet Take 1 tablet (20 mg total) by mouth 2 (two) times daily. Patient not taking: Reported on 03/11/2022 02/02/22 05/03/22  Marcell Anger, MD    Physical Exam: BP 126/78   Pulse (!) 107   Temp 98 F (36.7 C) (Oral)   Resp 17   Ht '4\' 11"'$  (1.499 m)   Wt 65.8 kg   LMP  (LMP Unknown)   SpO2 93%   BMI 29.29 kg/m   General: 51 y.o. year-old female well developed well nourished in no acute distress.  Alert and oriented x3. Cardiovascular: Regular rate and rhythm with no rubs or gallops.  No thyromegaly or JVD noted.  2+ lower extremity edema. 2/4 pulses in all 4 extremities. Respiratory: Clear to auscultation with no wheezes or rales. Good inspiratory effort. Abdomen: Distended with normal bowel sounds x4 quadrants and diffusely tender. Muskuloskeletal: No cyanosis or clubbing.  2+ pitting edema  noted in lower extremities bilaterally. Neuro: CN II-XII intact, strength, sensation, reflexes Skin: No ulcerative lesions noted or rashes Psychiatry: Judgement and insight appear normal. Mood is appropriate for condition and setting          Labs on Admission:  Basic Metabolic Panel: Recent Labs  Lab 03/10/22 2342  NA 137  K 3.7  CL 106  CO2 23  GLUCOSE 112*  BUN 6  CREATININE 0.52  CALCIUM 8.2*   Liver Function Tests: Recent Labs  Lab 03/10/22 2342  AST 69*  ALT 40  ALKPHOS 230*  BILITOT 2.7*  PROT 6.7  ALBUMIN 2.7*   Recent Labs  Lab 03/10/22 2342  LIPASE 54*   Recent Labs  Lab 03/11/22 0037  AMMONIA 64*   CBC: Recent Labs  Lab 03/10/22 2342  WBC 7.9  NEUTROABS 5.9  HGB 10.6*  HCT 36.2  MCV 90.3  PLT 97*   Cardiac Enzymes: No results for input(s): "CKTOTAL", "CKMB", "CKMBINDEX", "TROPONINI" in the last 168 hours.  BNP (last 3 results) Recent Labs    01/22/22 1727 03/10/22 2359  BNP 12.5 16.6    ProBNP (last 3 results) No results for input(s): "PROBNP" in the last 8760 hours.  CBG: No results for input(s): "GLUCAP" in the last 168 hours.  Radiological Exams on Admission: DG Abdomen Acute W/Chest  Result Date: 03/11/2022 CLINICAL DATA:  Abdominal distension and leg edema EXAM: DG ABDOMEN ACUTE WITH 1 VIEW CHEST COMPARISON:  02/28/2022 FINDINGS: Cardiac shadow is within normal limits. Lungs are well aerated bilaterally. Changes of prior granulomatous disease are seen. Scattered large and small bowel gas is noted. No obstructive changes are seen. Mild retained fecal material is noted in the left colon. No free air is noted. No bony abnormality is seen. IMPRESSION: No acute abnormality noted. Electronically Signed   By: Inez Catalina M.D.   On: 03/11/2022 01:47    EKG: I independently viewed the EKG done and my findings are as followed: Sinus tachycardia rate of 107.  Nonspecific ST-T changes.  QTc 455.  Assessment/Plan Present on Admission:   Abdominal pain  Principal Problem:   Abdominal pain  Abdominal pain secondary to large ascites from decompensated cirrhosis IR consulted for possible paracentesis on 03/11/2022. IV analgesics. IV albumin Low-salt diet, less than 2 g/day  Decompensated alcoholic cirrhosis Hyperammonemia, 64 MELD score 13, 6.0% estimated 39-monthmortality. IV albumin Paracentesis by IR, diagnostic and therapeutic Rocephin until SBP is ruled out Low sodium diet Fluid restriction Lactulose  Intractable nausea and vomiting, suspect gastroenteritis IV antiemetics as needed IV albumin  Chronic thrombocytopenia in the setting of end-stage liver disease Platelet count 97 Monitor  Chronic normocytic anemia in the setting of advanced cirrhosis Hemoglobin at baseline 10.6 No overt bleeding reported Continue to monitor H&H  Grade 1 esophageal varices Portal hypertensive gastropathy From EGD done on 02/02/2019 Resume home PPI. No overt bleeding.  Hemoglobin is stable.  Seizure disorder Resume home Keppra  Chronic anxiety/depression Resume home regimen   DVT prophylaxis: Subcu Lovenox daily  Code Status: Full code  Family Communication: None at bedside  Disposition Plan: Admitted to stepdown unit  Consults called: IR consulted for paracentesis.  Consult GI in the morning.  Admission status: Inpatient status.   Status is: Inpatient The patient requires at least 2 midnights for further evaluation and treatment of present condition   Kayleen Memos MD [facility] Pager [phone]  If 7PM-7AM, please contact night-coverage [email or web address] Password [email or web address]  03/11/2022, 2:56 AM

## 2022-03-12 DIAGNOSIS — R1084 Generalized abdominal pain: Secondary | ICD-10-CM

## 2022-03-12 DIAGNOSIS — F1020 Alcohol dependence, uncomplicated: Secondary | ICD-10-CM | POA: Diagnosis not present

## 2022-03-12 DIAGNOSIS — K766 Portal hypertension: Secondary | ICD-10-CM

## 2022-03-12 DIAGNOSIS — K7460 Unspecified cirrhosis of liver: Secondary | ICD-10-CM | POA: Diagnosis not present

## 2022-03-12 DIAGNOSIS — G40909 Epilepsy, unspecified, not intractable, without status epilepticus: Secondary | ICD-10-CM

## 2022-03-12 DIAGNOSIS — K7031 Alcoholic cirrhosis of liver with ascites: Secondary | ICD-10-CM | POA: Diagnosis not present

## 2022-03-12 DIAGNOSIS — I8510 Secondary esophageal varices without bleeding: Secondary | ICD-10-CM

## 2022-03-12 DIAGNOSIS — D696 Thrombocytopenia, unspecified: Secondary | ICD-10-CM

## 2022-03-12 LAB — CBC WITH DIFFERENTIAL/PLATELET
Abs Immature Granulocytes: 0.03 10*3/uL (ref 0.00–0.07)
Basophils Absolute: 0 10*3/uL (ref 0.0–0.1)
Basophils Relative: 1 %
Eosinophils Absolute: 0.2 10*3/uL (ref 0.0–0.5)
Eosinophils Relative: 4 %
HCT: 36.2 % (ref 36.0–46.0)
Hemoglobin: 10.5 g/dL — ABNORMAL LOW (ref 12.0–15.0)
Immature Granulocytes: 1 %
Lymphocytes Relative: 13 %
Lymphs Abs: 0.6 10*3/uL — ABNORMAL LOW (ref 0.7–4.0)
MCH: 26.9 pg (ref 26.0–34.0)
MCHC: 29 g/dL — ABNORMAL LOW (ref 30.0–36.0)
MCV: 92.6 fL (ref 80.0–100.0)
Monocytes Absolute: 0.6 10*3/uL (ref 0.1–1.0)
Monocytes Relative: 14 %
Neutro Abs: 2.9 10*3/uL (ref 1.7–7.7)
Neutrophils Relative %: 67 %
Platelets: 72 10*3/uL — ABNORMAL LOW (ref 150–400)
RBC: 3.91 MIL/uL (ref 3.87–5.11)
RDW: 27.9 % — ABNORMAL HIGH (ref 11.5–15.5)
WBC: 4.2 10*3/uL (ref 4.0–10.5)
nRBC: 0 % (ref 0.0–0.2)

## 2022-03-12 LAB — MAGNESIUM: Magnesium: 1.7 mg/dL (ref 1.7–2.4)

## 2022-03-12 LAB — COMPREHENSIVE METABOLIC PANEL
ALT: 29 U/L (ref 0–44)
AST: 51 U/L — ABNORMAL HIGH (ref 15–41)
Albumin: 2.7 g/dL — ABNORMAL LOW (ref 3.5–5.0)
Alkaline Phosphatase: 170 U/L — ABNORMAL HIGH (ref 38–126)
Anion gap: 7 (ref 5–15)
BUN: 8 mg/dL (ref 6–20)
CO2: 25 mmol/L (ref 22–32)
Calcium: 8.4 mg/dL — ABNORMAL LOW (ref 8.9–10.3)
Chloride: 104 mmol/L (ref 98–111)
Creatinine, Ser: 0.53 mg/dL (ref 0.44–1.00)
GFR, Estimated: >60 mL/min (ref >60)
Glucose, Bld: 104 mg/dL — ABNORMAL HIGH (ref 70–99)
Potassium: 4 mmol/L (ref 3.5–5.1)
Sodium: 136 mmol/L (ref 135–145)
Total Bilirubin: 2.6 mg/dL — ABNORMAL HIGH (ref 0.3–1.2)
Total Protein: 6 g/dL — ABNORMAL LOW (ref 6.5–8.1)

## 2022-03-12 MED ORDER — SPIRONOLACTONE 50 MG PO TABS
100.0000 mg | ORAL_TABLET | Freq: Every day | ORAL | 0 refills | Status: DC
Start: 2022-03-12 — End: 2022-04-11
  Filled 2022-03-12: qty 60, 30d supply, fill #0

## 2022-03-12 MED ORDER — MULTI-VITAMIN/MINERALS PO TABS: 1.0000 | ORAL_TABLET | Freq: Every day | ORAL | Status: DC

## 2022-03-12 MED ORDER — FOLIC ACID 1 MG PO TABS
1.0000 mg | ORAL_TABLET | Freq: Every day | ORAL | 0 refills | Status: AC
  Filled 2022-03-12: qty 30, 30d supply, fill #0

## 2022-03-12 MED ORDER — THIAMINE HCL 100 MG PO TABS
100.0000 mg | ORAL_TABLET | Freq: Every day | ORAL | 0 refills | Status: DC
  Filled 2022-03-12: qty 30, 30d supply, fill #0

## 2022-03-12 NOTE — Plan of Care (Signed)
  Problem: Nutrition: Goal: Adequate nutrition will be maintained Outcome: Adequate for Discharge   Problem: Coping: Goal: Level of anxiety will decrease Outcome: Adequate for Discharge   Problem: Elimination: Goal: Will not experience complications related to bowel motility Outcome: Adequate for Discharge   Problem: Elimination: Goal: Will not experience complications related to urinary retention Outcome: Adequate for Discharge   Problem: Pain Managment: Goal: General experience of comfort will improve Outcome: Adequate for Discharge   Problem: Safety: Goal: Ability to remain free from injury will improve Outcome: Adequate for Discharge   Problem: Skin Integrity: Goal: Risk for impaired skin integrity will decrease Outcome: Adequate for Discharge

## 2022-03-12 NOTE — TOC Transition Note (Signed)
Transition of Care [facility]) - CM/SW Discharge Note   Patient Details  Name: Briana Sweeney MRN: [id number] Date of Birth: 1970/12/02  Transition of Care [facility]) CM/SW Contact:  Leeroy Cha, RN Phone Number: 03/12/2022, 10:51 AM   Clinical Narrative:    Dcd to home with no toc needs.     Barriers to Discharge: Continued Medical Work up   Patient Goals and CMS Choice Patient states their goals for this hospitalization and ongoing recovery are:: to go home CMS Medicare.gov Compare Post Acute Care list provided to:: Patient Choice offered to / list presented to : Patient  Discharge Placement                       Discharge Plan and Services   Discharge Planning Services: CM Consult                                 Social Determinants of Health (SDOH) Interventions     Readmission Risk Interventions    03/01/2022   12:43 PM 02/02/2022   12:29 PM 07/06/2019    3:09 PM  Readmission Risk Prevention Plan  Transportation Screening Complete Complete Complete  Medication Review [employer] photographer) Complete Complete Complete  PCP or Specialist appointment within 3-5 days of discharge Complete Complete Complete  HRI or Home Care Consult Complete Complete Complete  SW Recovery Care/Counseling Consult Complete Complete Complete  Palliative Care Screening Not Applicable Complete Not [place] Not Applicable Not Applicable Not Applicable

## 2022-03-12 NOTE — Discharge Summary (Signed)
Physician Discharge Summary  Briana Sweeney FUX:[id number] DOB: Jan 21, 1971 DOA: 03/10/2022  PCP: Marco Collie, MD  Admit date: 03/10/2022 Discharge date: 03/12/2022  Admitted From: Home Disposition: Home  Recommendations for Outpatient Follow-up:  Follow up with PCP in 1 week with repeat CBC/CMP Outpatient follow-up with GI Comply with medications and follow-up Abstain from alcohol Follow up in ED if symptoms worsen or new appear   Home Health: No Equipment/Devices: None  Discharge Condition: Guarded CODE STATUS: Full Diet recommendation: Heart healthy/fluid restriction of up to 1200 cc a day  Brief/Interim Summary: 51 y.o. 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  During the hospitalization, she had paracentesis and removal of 1200 cc fluid by IR.  Subsequently, she feels much better and wants to go home today.  She will be discharged home today with close outpatient follow-up with PCP and GI.  Discharge Diagnoses:   Decompensated cirrhosis of liver with ascites with history of portal hypertensive gastropathy and grade 1 esophageal varices Hyperammonemia/hepatic encephalopathy Elevated LFTs Chronic thrombocytopenia -Presented with worsening abdominal pain and distention.  Status post ultrasound-guided paracentesis and removal of 1200 cc fluid.  She needs to have close follow-up with GI: Used to see Dr. Melina Copa in [place] but now is about to see another GI doctor.  Once close GI follow-up is established, she might need follow-up with IR for consideration of TIPS, as per IR recommendations -She needs to be compliant with her diuretics.  Continue Lasix 40 mg daily and spironolactone will be changed to 100 mg daily.  Close outpatient  follow-up with PCP and GI. -Feels better after paracentesis and wants to go home today.  Discharge patient home today. -Continue lactulose.  Continue diet and fluid restriction -Continue PPI  Intractable nausea and vomiting -Improved.  Currently tolerating diet.  Anemia chronic disease -Hemoglobin stable.  Seizure disorder -Continue Keppra  Chronic anxiety/depression -Continue home regimen.  Discharge Instructions  Discharge Instructions     Diet - low sodium heart healthy   Complete by: As directed    Increase activity slowly   Complete by: As directed       Allergies as of 03/12/2022       Reactions   Other Other (See Comments)   Platelets: Rx chest pain, tremors, body aches        Medication List     STOP taking these medications    predniSONE 20 MG tablet Commonly known as: DELTASONE       TAKE these medications    ARIPiprazole 10 MG tablet Commonly known as: ABILIFY Take 1 tablet by mouth every day What changed:  how much to take how to take this when to take this   clonazePAM 0.5 MG tablet Commonly known as: KLONOPIN TAKE 1 TABLET BY MOUTH TWICE DAILY What changed:  how much to take how to take this when to take this reasons to take this   FLUoxetine 40 MG capsule Commonly known as: PROZAC Take 1 capsule (40 mg total) by mouth daily. What changed: when to take this   folic acid 1 MG tablet Commonly known as: FOLVITE Take 1 tablet (1 mg total) by mouth daily.   furosemide 40 MG tablet Commonly known as: LASIX Take 40 mg by mouth daily.   lactulose 10 GM/15ML solution Commonly known as: CHRONULAC Take 30 mLs (20  g total) by mouth 3 (three) times daily.   levETIRAcetam 750 MG tablet Commonly known as: KEPPRA Take 1 tablet (750 mg total) by mouth 2 (two) times daily.   multivitamin with minerals tablet Take 1 tablet by mouth daily.   nadolol 20 MG tablet Commonly known as: Corgard Take 1 tablet (20 mg total) by mouth every  morning.   OVER THE COUNTER MEDICATION Take 1 capsule by mouth daily. OLLY Ultra brain soft gel   pantoprazole 40 MG tablet Commonly known as: PROTONIX Take 1 tablet (40 mg total) by mouth every morning.   spironolactone 50 MG tablet Commonly known as: ALDACTONE Take 2 tablets (100 mg total) by mouth daily. What changed: how much to take   thiamine 100 MG tablet Take 1 tablet (100 mg total) by mouth daily. Start taking on: March 13, 2022   vancomycin 125 MG capsule Commonly known as: VANCOCIN Take 1 capsule by mouth 4 times daily for 7 days.        Follow-up Information     Marco Collie, MD. Schedule an appointment as soon as possible for a visit in 1 week(s).   Specialty: Family Medicine Why: with cbc/cmp Contact information: [place] [address] [phone]         Gastroenterologist. Schedule an appointment as soon as possible for a visit in 1 week(s).                 Allergies  Allergen Reactions   Other Other (See Comments)    Platelets: Rx chest pain, tremors, body aches    Consultations: IR   Procedures/Studies: US Paracentesis  Result Date: 03/11/2022 INDICATION: Cirrhosis with ascites EXAM: ULTRASOUND GUIDED right lower quadrant PARACENTESIS MEDICATIONS: 8 cc 1% lidocaine COMPLICATIONS: None immediate. PROCEDURE: Informed written consent was obtained from the patient after a discussion of the risks, benefits and alternatives to treatment. A timeout was performed prior to the initiation of the procedure. Initial ultrasound scanning demonstrates a moderate amount of ascites within the right lower abdominal quadrant. The right lower abdomen was prepped and draped in the usual sterile fashion. 1% lidocaine was used for local anesthesia. Following this, a 19 gauge, 7-cm, Yueh catheter was introduced. An ultrasound image was saved for documentation purposes. The paracentesis was performed. The catheter was removed and a dressing was applied. The patient  tolerated the procedure well without immediate post procedural complication. Patient received post-procedure intravenous albumin; see nursing notes for details. FINDINGS: A total of approximately 1.2 L of thin yellow fluid was removed. Samples were sent to the laboratory as requested by the clinical team. IMPRESSION: Successful ultrasound-guided paracentesis yielding 1.2 liters of peritoneal fluid. PLAN: The patient has previously been formally evaluated by the [facility] Interventional Radiology Portal Hypertension [facility] and is being actively followed for potential future intervention. Electronically Signed   By: Ruthann Cancer M.D.   On: 03/11/2022 16:14   DG Abdomen Acute W/Chest  Result Date: 03/11/2022 CLINICAL DATA:  Abdominal distension and leg edema EXAM: DG ABDOMEN ACUTE WITH 1 VIEW CHEST COMPARISON:  02/28/2022 FINDINGS: Cardiac shadow is within normal limits. Lungs are well aerated bilaterally. Changes of prior granulomatous disease are seen. Scattered large and small bowel gas is noted. No obstructive changes are seen. Mild retained fecal material is noted in the left colon. No free air is noted. No bony abnormality is seen. IMPRESSION: No acute abnormality noted. Electronically Signed   By: Inez Catalina M.D.   On: 03/11/2022 01:47   US Paracentesis  Result  Date: 02/28/2022 INDICATION: Patient with history of alcoholic cirrhosis, esophageal varices and recurrent ascites. Request received for diagnostic and therapeutic paracentesis up to 5 L. EXAM: ULTRASOUND GUIDED DIAGNOSTIC AND THERAPEUTIC LEFT LOWER QUADRANT PARACENTESIS MEDICATIONS: 10 mL 1 % lidocaine COMPLICATIONS: None immediate. PROCEDURE: Informed written consent was obtained from the patient after a discussion of the risks, benefits and alternatives to treatment. A timeout was performed prior to the initiation of the procedure. Initial ultrasound scanning demonstrates a large amount of ascites within the left lower abdominal quadrant.  The left lower abdomen was prepped and draped in the usual sterile fashion. 1% lidocaine was used for local anesthesia. Following this, a 19 gauge, 10-cm, Yueh catheter was introduced. An ultrasound image was saved for documentation purposes. The paracentesis was performed. The catheter was removed and a dressing was applied. The patient tolerated the procedure well without immediate post procedural complication. FINDINGS: A total of approximately 3.4 L of clear, yellow fluid was removed. Samples were sent to the laboratory as requested by the clinical team. IMPRESSION: Successful ultrasound-guided paracentesis yielding 3.4 liters of peritoneal fluid. PLAN: The patient has previously been formally evaluated by the [facility] Interventional Radiology Portal Hypertension Clinic and is being actively followed for potential future intervention. Electronically Signed   By: Ruthann Cancer M.D.   On: 02/28/2022 14:13   CT ABDOMEN PELVIS WO CONTRAST  Result Date: 02/28/2022 CLINICAL DATA:  Pancreatitis suspected. Abdominal swelling, cirrhosis, multiple paracentesis. EXAM: CT ABDOMEN AND PELVIS WITHOUT CONTRAST TECHNIQUE: Multidetector CT imaging of the abdomen and pelvis was performed following the standard protocol without IV contrast. RADIATION DOSE REDUCTION: This exam was performed according to the departmental dose-optimization program which includes automated exposure control, adjustment of the mA and/or kV according to patient size and/or use of iterative reconstruction technique. COMPARISON:  01/30/2022. FINDINGS: Lower chest: Calcified lymph nodes are present at the left hilum. The heart is normal in size and there is no pericardial effusion. Atelectasis is noted at the lung bases. Hepatobiliary: The liver has an irregular contour, compatible with underlying cirrhosis. Fatty infiltration is noted and there is heterogeneous attenuation. A stone is present in the gallbladder. No biliary ductal dilatation.  Pancreas: Calcifications are noted in the head of the pancreas. No pancreatic ductal dilatation or pseudocyst. Spleen: Normal in size without focal abnormality. Adrenals/Urinary Tract: No adrenal nodule or mass. No renal calculus or hydronephrosis. The bladder is unremarkable. Stomach/Bowel: There is thickening of the walls of the distal esophagus with multiple paraesophageal varices. Possible small hiatal hernia. There is diffuse gastric wall thickening. Appendix appears normal. No bowel obstruction is seen. No free air or pneumatosis. There is colonic wall thickening involving the cecum, ascending, transverse, and descending colon, worsening from the prior exam. Vascular/Lymphatic: Aortic atherosclerosis. No enlarged abdominal or pelvic lymph nodes. Reproductive: Uterus and bilateral adnexa are unremarkable. Other: Moderate-to-large ascites. Musculoskeletal: No acute osseous abnormality. IMPRESSION: 1. Interval worsening of colonic wall thickening, most pronounced at the ascending colon suggesting colitis. 2. Thickening of the walls of the distal esophagus and gastric mucosa, possible esophagitis/gastritis. 3. Moderate-to-large ascites. 4. Morphologic changes of cirrhosis and portal hypertension. 5. Aortic atherosclerosis. Electronically Signed   By: Brett Fairy M.D.   On: 02/28/2022 02:27   CT Head Wo Contrast  Result Date: 02/27/2022 CLINICAL DATA:  Fall last night.  Head trauma EXAM: CT HEAD WITHOUT CONTRAST TECHNIQUE: Contiguous axial images were obtained from the base of the skull through the vertex without intravenous contrast. RADIATION DOSE REDUCTION: This exam was performed  according to the departmental dose-optimization program which includes automated exposure control, adjustment of the mA and/or kV according to patient size and/or use of iterative reconstruction technique. COMPARISON:  CT head 12/04/2021 FINDINGS: Brain: Generalized atrophy. Negative for hydrocephalus. Mild periventricular white  matter hypodensity. Small calcification left cerebellum unchanged. Negative for acute infarct, hemorrhage, mass Vascular: Negative for hyperdense vessel Skull: Negative Sinuses/Orbits: Negative Other: None IMPRESSION: Generalized atrophy.  No acute abnormality. Electronically Signed   By: Franchot Gallo M.D.   On: 02/27/2022 17:30      Subjective: Patient seen and examined at bedside.  Feels better and wants to go home today.  No overnight fever, vomiting.  Feels that her belly is less distended.  Having diarrhea.  Discharge Exam: Vitals:   03/12/22 0400 03/12/22 0800  BP: 95/63 117/78  Pulse: 95 95  Resp: 16 16  Temp:  98.2 F (36.8 C)  SpO2: 91% 91%    General: Pt is alert, awake, not in acute distress.  Looks chronically ill and deconditioned.  Flat affect.  Currently on room air. Cardiovascular: rate controlled, S1/S2 + Respiratory: bilateral decreased breath sounds at bases Abdominal: Soft, NT, still distended, bowel sounds + Extremities: Lower extremity edema present; no cyanosis    The results of significant diagnostics from this hospitalization (including imaging, microbiology, ancillary and laboratory) are listed below for reference.     Microbiology: Recent Results (from the past 240 hour(s))  MRSA Next Gen by PCR, Nasal     Status: None   Collection Time: 03/11/22  4:05 AM   Specimen: Nasal Mucosa; Nasal Swab  Result Value Ref Range Status   MRSA by PCR Next Gen NOT DETECTED NOT DETECTED Final    Comment: (NOTE) The GeneXpert MRSA Assay (FDA approved for NASAL specimens only), is one component of a comprehensive MRSA colonization surveillance program. It is not intended to diagnose MRSA infection nor to guide or monitor treatment for MRSA infections. Test performance is not FDA approved in patients less than 53 years old. Performed at [facility], [place] [address] [place], [place] [address]   Culture, body fluid w Gram Stain-bottle      Status: None (Preliminary result)   Collection Time: 03/11/22  1:21 PM   Specimen: Peritoneal Washings  Result Value Ref Range Status   Specimen Description PERITONEAL  Final   Special Requests NONE  Final   Culture   Final    NO GROWTH < 12 HOURS Performed at [facility], [place] [address] [place], [place] [address]    Report Status PENDING  Incomplete  Gram stain     Status: None   Collection Time: 03/11/22  1:21 PM   Specimen: Peritoneal Washings  Result Value Ref Range Status   Specimen Description PERITONEAL  Final   Special Requests NONE  Final   Gram Stain   Final    FEW WBC PRESENT, PREDOMINANTLY MONONUCLEAR NO ORGANISMS SEEN Performed at [facility], [place] [address] [place], [place] [address]    Report Status 03/11/2022 FINAL  Final     Labs: BNP (last 3 results) Recent Labs    01/22/22 1727 03/10/22 2359  BNP 12.5 70.1   Basic Metabolic Panel: Recent Labs  Lab 03/10/22 2342 03/11/22 0459 03/12/22 0315  NA 137 139 136  K 3.7 3.8 4.0  CL 106 109 104  CO2 '23 23 25  '$ GLUCOSE 112* 91 104*  BUN '6 6 8  '$ CREATININE 0.52 0.44 0.53  CALCIUM  8.2* 7.6* 8.4*  MG  --  1.9 1.7  PHOS  --  3.1  --    Liver Function Tests: Recent Labs  Lab 03/10/22 2342 03/11/22 0459 03/12/22 0315  AST 69* 71* 51*  ALT 40 33 29  ALKPHOS 230* 181* 170*  BILITOT 2.7* 1.9* 2.6*  PROT 6.7 5.8* 6.0*  ALBUMIN 2.7* 2.5* 2.7*   Recent Labs  Lab 03/10/22 2342  LIPASE 54*   Recent Labs  Lab 03/11/22 0037  AMMONIA 64*   CBC: Recent Labs  Lab 03/10/22 2342 03/11/22 0459 03/12/22 0315  WBC 7.9 5.6 4.2  NEUTROABS 5.9 3.8 2.9  HGB 10.6* 9.6* 10.5*  HCT 36.2 32.9* 36.2  MCV 90.3 91.4 92.6  PLT 97* 82* 72*   Cardiac Enzymes: No results for input(s): "CKTOTAL", "CKMB", "CKMBINDEX", "TROPONINI" in the last 168 hours. BNP: Invalid input(s): "POCBNP" CBG: No results for input(s): "GLUCAP" in the last 168 hours. D-Dimer No results for input(s): "DDIMER" in the  last 72 hours. Hgb A1c No results for input(s): "HGBA1C" in the last 72 hours. Lipid Profile No results for input(s): "CHOL", "HDL", "LDLCALC", "TRIG", "CHOLHDL", "LDLDIRECT" in the last 72 hours. Thyroid function studies No results for input(s): "TSH", "T4TOTAL", "T3FREE", "THYROIDAB" in the last 72 hours.  Invalid input(s): "FREET3" Anemia work up No results for input(s): "VITAMINB12", "FOLATE", "FERRITIN", "TIBC", "IRON", "RETICCTPCT" in the last 72 hours. Urinalysis    Component Value Date/Time   COLORURINE YELLOW 03/02/2022 1302   APPEARANCEUR HAZY (A) 03/02/2022 1302   LABSPEC 1.006 03/02/2022 1302   PHURINE 7.0 03/02/2022 1302   GLUCOSEU NEGATIVE 03/02/2022 1302   HGBUR SMALL (A) 03/02/2022 1302   BILIRUBINUR NEGATIVE 03/02/2022 1302   BILIRUBINUR NEG 05/26/2020 1544   KETONESUR NEGATIVE 03/02/2022 1302   PROTEINUR NEGATIVE 03/02/2022 1302   UROBILINOGEN 1.0 05/26/2020 1544   UROBILINOGEN 0.2 12/02/2017 0836   NITRITE NEGATIVE 03/02/2022 1302   LEUKOCYTESUR LARGE (A) 03/02/2022 1302   Sepsis Labs Recent Labs  Lab 03/10/22 2342 03/11/22 0459 03/12/22 0315  WBC 7.9 5.6 4.2   Microbiology Recent Results (from the past 240 hour(s))  MRSA Next Gen by PCR, Nasal     Status: None   Collection Time: 03/11/22  4:05 AM   Specimen: Nasal Mucosa; Nasal Swab  Result Value Ref Range Status   MRSA by PCR Next Gen NOT DETECTED NOT DETECTED Final    Comment: (NOTE) The GeneXpert MRSA Assay (FDA approved for NASAL specimens only), is one component of a comprehensive MRSA colonization surveillance program. It is not intended to diagnose MRSA infection nor to guide or monitor treatment for MRSA infections. Test performance is not FDA approved in patients less than 40 years old. Performed at [facility], [place] [address] [place], [place] [address]   Culture, body fluid w Gram Stain-bottle     Status: None (Preliminary result)   Collection Time: 03/11/22   1:21 PM   Specimen: Peritoneal Washings  Result Value Ref Range Status   Specimen Description PERITONEAL  Final   Special Requests NONE  Final   Culture   Final    NO GROWTH < 12 HOURS Performed at [facility], [place] [address] [place], [place] [address]    Report Status PENDING  Incomplete  Gram stain     Status: None   Collection Time: 03/11/22  1:21 PM   Specimen: Peritoneal Washings  Result Value Ref Range Status   Specimen Description PERITONEAL  Final   Special  Requests NONE  Final   Gram Stain   Final    FEW WBC PRESENT, PREDOMINANTLY MONONUCLEAR NO ORGANISMS SEEN Performed at [facility], [place] [address], [place], [place] [address]    Report Status 03/11/2022 FINAL  Final     Time coordinating discharge: 35 minutes  SIGNED:   Aline August, MD  [facility] 03/12/2022, 10:34 AM

## 2022-03-12 NOTE — TOC Initial Note (Signed)
Transition of Care [facility]) - Initial/Assessment Note    Patient Details  Name: Briana Sweeney MRN: [id number] Date of Birth: 08-18-1971  [facility]) CM/SW Contact:    Leeroy Cha, RN Phone Number: 03/12/2022, 8:22 AM  Clinical Narrative:                 Hx of end stage alcoholic cirrhosis.  Will need substance abuse resources when stable. Lives alone. No family support.  Expected Discharge Plan: Home/Self Care Barriers to Discharge: Continued Medical Work up   Patient Goals and CMS Choice Patient states their goals for this hospitalization and ongoing recovery are:: to go home CMS Medicare.gov Compare Post Acute Care list provided to:: Patient Choice offered to / list presented to : Patient  Expected Discharge Plan and Services Expected Discharge Plan: Home/Self Care   Discharge Planning Services: CM Consult   Living arrangements for the past 2 months: Apartment                                      Prior Living Arrangements/Services Living arrangements for the past 2 months: Apartment Lives with:: Self Patient language and need for interpreter reviewed:: Yes Do you feel safe going back to the place where you live?: Yes            Criminal Activity/Legal Involvement Pertinent to Current Situation/Hospitalization: No - Comment as needed  Activities of Daily Living      Permission Sought/Granted                  Emotional Assessment Appearance:: Appears stated age     Orientation: : Oriented to Self, Oriented to Place, Oriented to  Time, Oriented to Situation Alcohol / Substance Use: Alcohol Use Psych Involvement: No (comment)  Admission diagnosis:  Abdominal pain [R10.9] Alcohol withdrawal syndrome without complication ([place]) [Y07.371] Ascites due to alcoholic cirrhosis ([place]) [G62.69] Patient Active Problem List   Diagnosis Date Noted   C. difficile colitis 48/54/6270   ALC (alcoholic liver cirrhosis) ([place]) 35/00/9381    Alcoholic cirrhosis of liver with ascites ([place]) 02/27/2022   Ascites 01/31/2022   Hyperkalemia 01/30/2022   Cirrhosis of liver ([place]) 01/23/2022   Liver failure without hepatic coma ([place]) 01/22/2022   Hypophosphatemia 01/22/2022   Severe neutropenia ([place]) 12/16/2021   Cellulitis 12/16/2021   Mild protein malnutrition ([place]) 12/15/2021   Anasarca 11/01/2021   Acute posthemorrhagic anemia    Esophageal 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 ([place])    Abdominal pain 10/31/2021   Palpitations 10/11/2021   Bradycardia 07/23/2021   Secondary esophageal varices with bleeding ([place])    Alcohol withdrawal ([place]) 06/26/2021   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[place]) 12/18/2020   Chronic anemia 10/02/2020   Hyponatremia 04/02/2019   Other mixed anxiety disorders 10/27/2018   Neuropathy 10/27/2018   Substance induced mood disorder ([place]) 07/17/2018   Encephalopathy, portal systemic 04/08/2018   Seizure disorder ([place]) 03/18/2018   Hepatitis C 03/18/2018   Portal hypertension ([place]) 03/18/2018   Depression 03/18/2018   Thrombocytopenia ([place]) 01/02/2018   GERD (gastroesophageal reflux disease) 11/01/2017   Alcohol use disorder, severe, dependence ([place]) 10/29/2017   Alcohol abuse with alcohol-induced mood disorder ([place])    Esophageal varices without bleeding ([place])    Hematemesis 09/01/2017   Abdominal ascites    Hypokalemia 07/23/2017   Coagulopathy ([place]) 07/23/2017   Hypomagnesemia 07/23/2017   Pancytopenia ([place]) 07/23/2017   PCP:  Nyra Capes,  Eustaquio Maize, MD Pharmacy:   [facility] [place] [address] Phone: [phone] Fax: [phone]     Social Determinants of Health (SDOH) Interventions    Readmission Risk Interventions    03/01/2022   12:43 PM 02/02/2022   12:29 PM 07/06/2019    3:09 PM  Readmission Risk Prevention Plan  Transportation Screening Complete Complete Complete  Medication Review ([place]) Complete Complete Complete  PCP or Specialist  appointment within 3-5 days of discharge Complete Complete Complete  HRI or Home Care Consult Complete Complete Complete  SW Recovery Care/Counseling Consult Complete Complete Complete  Palliative Care Screening Not Applicable Complete Not [place] Not Applicable Not Applicable Not Applicable

## 2022-03-12 NOTE — Progress Notes (Signed)
Patient discharge to home with family friend. Patients phone, charger, jewelry and clothes left with patient. Patient stable at the time of discharge. Discharge instructions in hand.   Molinda Bailiff RN  03/12/2022 1106

## 2022-03-13 ENCOUNTER — Ambulatory Visit: Payer: Self-pay

## 2022-03-13 ENCOUNTER — Other Ambulatory Visit (HOSPITAL_COMMUNITY): Payer: Self-pay

## 2022-03-13 ENCOUNTER — Encounter: Payer: Self-pay | Admitting: Oncology

## 2022-03-13 LAB — MISC LABCORP TEST (SEND OUT): Labcorp test code: 9985

## 2022-03-15 LAB — CYTOLOGY - NON PAP

## 2022-03-16 LAB — CULTURE, BODY FLUID W GRAM STAIN -BOTTLE: Culture: NO GROWTH

## 2022-03-18 ENCOUNTER — Other Ambulatory Visit: Payer: Self-pay

## 2022-03-18 NOTE — Progress Notes (Signed)
Spoke with [employer] and set up transportation for DOS 07/17, 07/19, 07/24, and 07/26.

## 2022-03-19 ENCOUNTER — Ambulatory Visit: Payer: Medicaid Other

## 2022-03-19 ENCOUNTER — Other Ambulatory Visit: Payer: Medicaid Other

## 2022-03-20 ENCOUNTER — Other Ambulatory Visit: Payer: Medicaid Other

## 2022-03-20 ENCOUNTER — Inpatient Hospital Stay: Payer: Medicaid Other

## 2022-03-21 ENCOUNTER — Other Ambulatory Visit (HOSPITAL_COMMUNITY): Payer: Self-pay

## 2022-03-21 MED ORDER — IBUPROFEN 800 MG PO TABS
ORAL_TABLET | ORAL | 0 refills | Status: DC
  Filled 2022-03-21: qty 30, 10d supply, fill #0

## 2022-03-21 MED ORDER — LIDOCAINE 5 % EX PTCH
MEDICATED_PATCH | CUTANEOUS | 0 refills | Status: DC
  Filled 2022-03-21: qty 10, 10d supply, fill #0

## 2022-03-21 MED ORDER — METHOCARBAMOL 500 MG PO TABS
ORAL_TABLET | ORAL | 0 refills | Status: DC
  Filled 2022-03-21: qty 15, 7d supply, fill #0

## 2022-03-21 MED ORDER — ACETAMINOPHEN 500 MG PO TABS: ORAL_TABLET | ORAL | 0 refills | Status: DC

## 2022-03-22 ENCOUNTER — Other Ambulatory Visit (HOSPITAL_COMMUNITY): Payer: Self-pay

## 2022-03-25 ENCOUNTER — Other Ambulatory Visit: Payer: Self-pay

## 2022-03-25 ENCOUNTER — Other Ambulatory Visit: Payer: Self-pay | Admitting: Pharmacist

## 2022-03-26 ENCOUNTER — Other Ambulatory Visit (HOSPITAL_COMMUNITY): Payer: Self-pay

## 2022-03-27 ENCOUNTER — Ambulatory Visit: Payer: Self-pay

## 2022-03-29 ENCOUNTER — Other Ambulatory Visit (HOSPITAL_COMMUNITY): Payer: Self-pay

## 2022-04-01 ENCOUNTER — Telehealth: Payer: Self-pay | Admitting: Oncology

## 2022-04-01 ENCOUNTER — Other Ambulatory Visit: Payer: Self-pay

## 2022-04-01 ENCOUNTER — Other Ambulatory Visit (HOSPITAL_COMMUNITY): Payer: Self-pay

## 2022-04-01 NOTE — Telephone Encounter (Signed)
04/01/22 Patient called and cancelled appts.She is in the hospital at [facility].

## 2022-04-02 NOTE — Progress Notes (Signed)
Spoke with [facility], set up transportation for DOS 07/31 and 08/02

## 2022-04-03 ENCOUNTER — Other Ambulatory Visit (HOSPITAL_COMMUNITY): Payer: Self-pay

## 2022-04-03 ENCOUNTER — Ambulatory Visit: Payer: Self-pay

## 2022-04-08 ENCOUNTER — Other Ambulatory Visit: Payer: Self-pay

## 2022-04-09 ENCOUNTER — Other Ambulatory Visit: Payer: Self-pay | Admitting: Oncology

## 2022-04-09 ENCOUNTER — Encounter: Payer: Self-pay | Admitting: Oncology

## 2022-04-09 ENCOUNTER — Other Ambulatory Visit: Payer: Self-pay | Admitting: Pharmacist

## 2022-04-09 ENCOUNTER — Other Ambulatory Visit (HOSPITAL_COMMUNITY): Payer: Self-pay

## 2022-04-09 MED ORDER — ACAMPROSATE CALCIUM 333 MG PO TBEC
DELAYED_RELEASE_TABLET | ORAL | 0 refills | Status: DC
  Filled 2022-04-09: qty 180, 30d supply, fill #0

## 2022-04-09 MED ORDER — ACAMPROSATE CALCIUM 333 MG PO TBEC
DELAYED_RELEASE_TABLET | ORAL | 0 refills | Status: DC
  Filled 2022-04-09 – 2022-04-16 (×3): qty 180, 30d supply, fill #0

## 2022-04-09 NOTE — Progress Notes (Signed)
Spoke with [employer] and scheduled transportation for DOS 08/07 and 08/09.

## 2022-04-10 ENCOUNTER — Ambulatory Visit: Payer: Medicaid Other | Admitting: Plastic Surgery

## 2022-04-10 ENCOUNTER — Other Ambulatory Visit (HOSPITAL_COMMUNITY): Payer: Self-pay

## 2022-04-10 ENCOUNTER — Encounter: Payer: Self-pay | Admitting: Oncology

## 2022-04-10 ENCOUNTER — Inpatient Hospital Stay: Payer: Medicaid Other | Attending: Hematology & Oncology

## 2022-04-10 DIAGNOSIS — Z5112 Encounter for antineoplastic immunotherapy: Secondary | ICD-10-CM | POA: Insufficient documentation

## 2022-04-10 DIAGNOSIS — K7030 Alcoholic cirrhosis of liver without ascites: Secondary | ICD-10-CM | POA: Insufficient documentation

## 2022-04-10 DIAGNOSIS — F1020 Alcohol dependence, uncomplicated: Secondary | ICD-10-CM | POA: Insufficient documentation

## 2022-04-10 DIAGNOSIS — D6959 Other secondary thrombocytopenia: Secondary | ICD-10-CM | POA: Insufficient documentation

## 2022-04-10 DIAGNOSIS — D693 Immune thrombocytopenic purpura: Secondary | ICD-10-CM | POA: Insufficient documentation

## 2022-04-10 DIAGNOSIS — Z79899 Other long term (current) drug therapy: Secondary | ICD-10-CM | POA: Insufficient documentation

## 2022-04-10 MED FILL — Rituximab-abbs IV Soln 500 MG/50ML (10 MG/ML): INTRAVENOUS | Qty: 60 | Status: AC

## 2022-04-10 NOTE — Progress Notes (Signed)
..  Pharmacist Chemotherapy Monitoring - Initial Assessment    Anticipated start date: 04/11/22  The following has been reviewed per standard work regarding the patient's treatment regimen: The patient's diagnosis, treatment plan and drug doses, and organ/hematologic function Lab orders and baseline tests specific to treatment regimen  The treatment plan start date, drug sequencing, and pre-medications Prior authorization status  Patient's documented medication list, including drug-drug interaction screen and prescriptions for anti-emetics and supportive care specific to the treatment regimen The drug concentrations, fluid compatibility, administration routes, and timing of the medications to be used The patient's access for treatment and lifetime cumulative dose history, if applicable  The patient's medication allergies and previous infusion related reactions, if applicable   Changes made to treatment plan:  N/A  Follow up needed:  N/A   Juanetta Beets, [facility], 04/10/2022  1:44 PM

## 2022-04-10 NOTE — Progress Notes (Signed)
Ulice Dash Pharm D in for Rituxin teaching- Patient states understanding that [place] transportation will pick her up for transport I the am. Patient acknowledges the importance of keeping appointments and states that she will be here in the am.

## 2022-04-11 ENCOUNTER — Ambulatory Visit: Payer: Medicaid Other | Admitting: Plastic Surgery

## 2022-04-11 ENCOUNTER — Other Ambulatory Visit (HOSPITAL_COMMUNITY): Payer: Self-pay

## 2022-04-11 ENCOUNTER — Inpatient Hospital Stay: Payer: Medicaid Other

## 2022-04-11 VITALS — BP 114/74 | HR 62 | Temp 98.0°F | Resp 16

## 2022-04-11 DIAGNOSIS — D693 Immune thrombocytopenic purpura: Secondary | ICD-10-CM

## 2022-04-11 DIAGNOSIS — K7030 Alcoholic cirrhosis of liver without ascites: Secondary | ICD-10-CM | POA: Diagnosis not present

## 2022-04-11 DIAGNOSIS — Z79899 Other long term (current) drug therapy: Secondary | ICD-10-CM | POA: Diagnosis not present

## 2022-04-11 DIAGNOSIS — D6959 Other secondary thrombocytopenia: Secondary | ICD-10-CM | POA: Diagnosis not present

## 2022-04-11 DIAGNOSIS — F1020 Alcohol dependence, uncomplicated: Secondary | ICD-10-CM | POA: Diagnosis not present

## 2022-04-11 DIAGNOSIS — Z5112 Encounter for antineoplastic immunotherapy: Secondary | ICD-10-CM | POA: Diagnosis present

## 2022-04-11 MED ORDER — DIPHENHYDRAMINE HCL 25 MG PO CAPS
50.0000 mg | ORAL_CAPSULE | Freq: Once | ORAL | Status: AC
  Administered 2022-04-11: 50 mg via ORAL
  Filled 2022-04-11: qty 2

## 2022-04-11 MED ORDER — SODIUM CHLORIDE 0.9 % IV SOLN: Freq: Once | INTRAVENOUS | Status: AC

## 2022-04-11 MED ORDER — SODIUM CHLORIDE 0.9 % IV SOLN
375.0000 mg/m2 | Freq: Once | INTRAVENOUS | Status: AC
  Administered 2022-04-11: 600 mg via INTRAVENOUS
  Filled 2022-04-11: qty 50

## 2022-04-11 MED ORDER — SODIUM CHLORIDE 0.9% FLUSH: 10.0000 mL | INTRAVENOUS | Status: DC | PRN

## 2022-04-11 MED ORDER — HEPARIN SOD (PORK) LOCK FLUSH 100 UNIT/ML IV SOLN: 500.0000 [IU] | Freq: Once | INTRAVENOUS | Status: DC | PRN

## 2022-04-11 MED ORDER — ACETAMINOPHEN 325 MG PO TABS
650.0000 mg | ORAL_TABLET | Freq: Once | ORAL | Status: AC
  Administered 2022-04-11: 650 mg via ORAL
  Filled 2022-04-11: qty 2

## 2022-04-11 NOTE — Progress Notes (Signed)
Patient states that she stopped having her periods at age 51. States she has not had a hysterectomy

## 2022-04-11 NOTE — Patient Instructions (Signed)
Rituximab Injection What is this medication? RITUXIMAB (ri TUX i mab) treats leukemia and lymphoma. It works by blocking a protein that causes cancer cells to grow and multiply. This helps to slow or stop the spread of cancer cells. It may also be used to treat autoimmune conditions, such as arthritis. It works by slowing down an overactive immune system. It is a monoclonal antibody. This medicine may be used for other purposes; ask your health care provider or pharmacist if you have questions. COMMON BRAND NAME(S): RIABNI, Rituxan, RUXIENCE, truxima What should I tell my care team before I take this medication? They need to know if you have any of these conditions: Chest pain Heart disease Immune system problems Infection, such as chickenpox, cold sores, hepatitis B, herpes Irregular heartbeat or rhythm Kidney disease Low blood counts, such as low white cells, platelets, red cells Lung disease Recent or upcoming vaccine An unusual or allergic reaction to rituximab, other medications, foods, dyes, or preservatives Pregnant or trying to get pregnant Breast-feeding How should I use this medication? This medication is injected into a vein. It is given by a care team in a hospital or clinic setting. A special MedGuide will be given to you before each treatment. Be sure to read this information carefully each time. Talk to your care team about the use of this medication in children. While this medication may be prescribed for children as young as 6 months for selected conditions, precautions do apply. Overdosage: If you think you have taken too much of this medicine contact a poison control center or emergency room at once. NOTE: This medicine is only for you. Do not share this medicine with others. What if I miss a dose? Keep appointments for follow-up doses. It is important not to miss your dose. Call your care team if you are unable to keep an appointment. What may interact with this  medication? Do not take this medication with any of the following: Live vaccines This medication may also interact with the following: Cisplatin This list may not describe all possible interactions. Give your health care provider a list of all the medicines, herbs, non-prescription drugs, or dietary supplements you use. Also tell them if you smoke, drink alcohol, or use illegal drugs. Some items may interact with your medicine. What should I watch for while using this medication? Your condition will be monitored carefully while you are receiving this medication. You may need blood work while taking this medication. This medication can cause serious infusion reactions. To reduce the risk your care team may give you other medications to take before receiving this one. Be sure to follow the directions from your care team. This medication may increase your risk of getting an infection. Call your care team for advice if you get a fever, chills, sore throat, or other symptoms of a cold or flu. Do not treat yourself. Try to avoid being around people who are sick. Call your care team if you are around anyone with measles, chickenpox, or if you develop sores or blisters that do not heal properly. Avoid taking medications that contain aspirin, acetaminophen, ibuprofen, naproxen, or ketoprofen unless instructed by your care team. These medications may hide a fever. This medication may cause serious skin reactions. They can happen weeks to months after starting the medication. Contact your care team right away if you notice fevers or flu-like symptoms with a rash. The rash may be red or purple and then turn into blisters or peeling of the skin.  You may also notice a red rash with swelling of the face, lips, or lymph nodes in your neck or under your arms. In some patients, this medication may cause a serious brain infection that may cause death. If you have any problems seeing, thinking, speaking, walking, or  standing, tell your care team right away. If you cannot reach your care team, urgently seek another source of medical care. Talk to your care team if you may be pregnant. Serious birth defects can occur if you take this medication during pregnancy and for 12 months after the last dose. You will need a negative pregnancy test before starting this medication. Contraception is recommended while taking this medication and for 12 months after the last dose. Your care team can help you find the option that works for you. Do not breastfeed while taking this medication and for at least 6 months after the last dose. What side effects may I notice from receiving this medication? Side effects that you should report to your care team as soon as possible: Allergic reactions or angioedema--skin rash, itching or hives, swelling of the face, eyes, lips, tongue, arms, or legs, trouble swallowing or breathing Bowel blockage--stomach cramping, unable to have a bowel movement or pass gas, loss of appetite, vomiting Dizziness, loss of balance or coordination, confusion or trouble speaking Heart attack--pain or tightness in the chest, shoulders, arms, or jaw, nausea, shortness of breath, cold or clammy skin, feeling faint or lightheaded Heart rhythm changes--fast or irregular heartbeat, dizziness, feeling faint or lightheaded, chest pain, trouble breathing Infection--fever, chills, cough, sore throat, wounds that don't heal, pain or trouble when passing urine, general feeling of discomfort or being unwell Infusion reactions--chest pain, shortness of breath or trouble breathing, feeling faint or lightheaded Kidney injury--decrease in the amount of urine, swelling of the ankles, hands, or feet Liver injury--right upper belly pain, loss of appetite, nausea, light-colored stool, dark yellow or brown urine, yellowing skin or eyes, unusual weakness or fatigue Redness, blistering, peeling, or loosening of the skin, including  inside the mouth Stomach pain that is severe, does not go away, or gets worse Tumor lysis syndrome (TLS)--nausea, vomiting, diarrhea, decrease in the amount of urine, dark urine, unusual weakness or fatigue, confusion, muscle pain or cramps, fast or irregular heartbeat, joint pain Side effects that usually do not require medical attention (report to your care team if they continue or are bothersome): Headache Joint pain Nausea Runny or stuffy nose Unusual weakness or fatigue This list may not describe all possible side effects. Call your doctor for medical advice about side effects. You may report side effects to FDA at 1-800-FDA-1088. Where should I keep my medication? This medication is given in a hospital or clinic. It will not be stored at home. NOTE: This sheet is a summary. It may not cover all possible information. If you have questions about this medicine, talk to your doctor, pharmacist, or health care provider.  2023 Elsevier/Gold Standard (2022-01-14 00:00:00)

## 2022-04-11 NOTE — Progress Notes (Signed)
Patient resting quietly in treatment room until ride arrives

## 2022-04-12 ENCOUNTER — Telehealth: Payer: Self-pay

## 2022-04-12 ENCOUNTER — Ambulatory Visit: Payer: Medicaid Other | Admitting: Plastic Surgery

## 2022-04-12 ENCOUNTER — Other Ambulatory Visit (HOSPITAL_COMMUNITY): Payer: Self-pay

## 2022-04-12 NOTE — Telephone Encounter (Addendum)
04/15/2022- Pt in clinic to see [facility] today.  04/12/2022 - Attempted call to pt.to see how she tolerated her first infusion of Rituxan, no answer @ either number.

## 2022-04-15 ENCOUNTER — Ambulatory Visit: Payer: Medicaid Other | Admitting: Plastic Surgery

## 2022-04-15 ENCOUNTER — Encounter: Payer: Self-pay | Admitting: Oncology

## 2022-04-15 ENCOUNTER — Encounter: Payer: Self-pay | Admitting: Hematology and Oncology

## 2022-04-15 ENCOUNTER — Ambulatory Visit: Payer: Commercial Managed Care - HMO | Admitting: Hematology and Oncology

## 2022-04-15 ENCOUNTER — Inpatient Hospital Stay (INDEPENDENT_AMBULATORY_CARE_PROVIDER_SITE_OTHER): Payer: Medicaid Other | Admitting: Hematology and Oncology

## 2022-04-15 ENCOUNTER — Inpatient Hospital Stay: Payer: Medicaid Other

## 2022-04-15 VITALS — BP 113/62 | HR 61 | Temp 98.0°F | Resp 16 | Ht 59.0 in | Wt 136.6 lb

## 2022-04-15 DIAGNOSIS — D693 Immune thrombocytopenic purpura: Secondary | ICD-10-CM | POA: Diagnosis not present

## 2022-04-15 LAB — CBC
MCV: 87 (ref 81–99)
RBC: 3.62 — AB (ref 3.87–5.11)

## 2022-04-15 LAB — CBC AND DIFFERENTIAL
HCT: 32 — AB (ref 36–46)
Hemoglobin: 10.1 — AB (ref 12.0–16.0)
Neutrophils Absolute: 2.49
Platelets: 43 10*3/uL — AB (ref 150–400)
WBC: 3
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
## 2022-04-16 ENCOUNTER — Other Ambulatory Visit (HOSPITAL_COMMUNITY): Payer: Self-pay

## 2022-04-16 NOTE — Progress Notes (Signed)
Spoke with [facility] and scheduled transportation for DOS 04/22/2022 and 04/24/2022.

## 2022-04-17 ENCOUNTER — Inpatient Hospital Stay: Payer: Medicaid Other

## 2022-04-17 VITALS — BP 111/73 | HR 72 | Temp 98.5°F | Resp 18 | Ht 59.0 in | Wt 137.0 lb

## 2022-04-17 DIAGNOSIS — D696 Thrombocytopenia, unspecified: Secondary | ICD-10-CM

## 2022-04-17 DIAGNOSIS — Z5112 Encounter for antineoplastic immunotherapy: Secondary | ICD-10-CM | POA: Diagnosis not present

## 2022-04-17 MED ORDER — ROMIPLOSTIM 125 MCG ~~LOC~~ SOLR
6.0500 ug/kg | Freq: Once | SUBCUTANEOUS | Status: AC
  Administered 2022-04-17: 375 ug via SUBCUTANEOUS
  Filled 2022-04-17: qty 0.5

## 2022-04-17 MED FILL — Rituximab-abbs IV Soln 500 MG/50ML (10 MG/ML): INTRAVENOUS | Qty: 60 | Status: AC

## 2022-04-17 NOTE — Patient Instructions (Signed)
Romiplostim Injection What is this medication? ROMIPLOSTIM (roe mi PLOE stim) treats low levels of platelets in your body caused by immune thrombocytopenia (ITP). It is prescribed when other medications have not worked or cannot be tolerated. It may also be used to help people who have been exposed to high doses of radiation. It works by increasing the amount of platelets in your blood. This lowers the risk of bleeding. This medicine may be used for other purposes; ask your health care provider or pharmacist if you have questions. COMMON BRAND NAME(S): Nplate What should I tell my care team before I take this medication? They need to know if you have any of these conditions: Blood clots Myelodysplastic syndrome An unusual or allergic reaction to romiplostim, mannitol, other medications, foods, dyes, or preservatives Pregnant or trying to get pregnant Breast-feeding How should I use this medication? This medication is injected under the skin. It is given by a care team in a hospital or clinic setting. A special MedGuide will be given to you before each treatment. Be sure to read this information carefully each time. Talk to your care team about the use of this medication in children. While it may be prescribed for children as young as newborns for selected conditions, precautions do apply. Overdosage: If you think you have taken too much of this medicine contact a poison control center or emergency room at once. NOTE: This medicine is only for you. Do not share this medicine with others. What if I miss a dose? Keep appointments for follow-up doses. It is important not to miss your dose. Call your care team if you are unable to keep an appointment. What may interact with this medication? Interactions are not expected. This list may not describe all possible interactions. Give your health care provider a list of all the medicines, herbs, non-prescription drugs, or dietary supplements you use. Also  tell them if you smoke, drink alcohol, or use illegal drugs. Some items may interact with your medicine. What should I watch for while using this medication? Visit your care team for regular checks on your progress. You may need blood work done while you are taking this medication. Your condition will be monitored carefully while you are receiving this medication. It is important not to miss any appointments. What side effects may I notice from receiving this medication? Side effects that you should report to your care team as soon as possible: Allergic reactions--skin rash, itching, hives, swelling of the face, lips, tongue, or throat Blood clot--pain, swelling, or warmth in the leg, shortness of breath, chest pain Side effects that usually do not require medical attention (report to your care team if they continue or are bothersome): Dizziness Joint pain Muscle pain Pain in the hands or feet Stomach pain Trouble sleeping This list may not describe all possible side effects. Call your doctor for medical advice about side effects. You may report side effects to FDA at 1-800-FDA-1088. Where should I keep my medication? This medication is given in a hospital or clinic. It will not be stored at home. NOTE: This sheet is a summary. It may not cover all possible information. If you have questions about this medicine, talk to your doctor, pharmacist, or health care provider.  2023 Elsevier/Gold Standard (2021-12-04 00:00:00)  

## 2022-04-18 ENCOUNTER — Inpatient Hospital Stay: Payer: Medicaid Other

## 2022-04-18 VITALS — BP 107/70 | HR 64 | Temp 98.3°F | Resp 18 | Ht 59.0 in | Wt 136.5 lb

## 2022-04-18 DIAGNOSIS — D693 Immune thrombocytopenic purpura: Secondary | ICD-10-CM

## 2022-04-18 DIAGNOSIS — Z5112 Encounter for antineoplastic immunotherapy: Secondary | ICD-10-CM | POA: Diagnosis not present

## 2022-04-18 MED ORDER — ACETAMINOPHEN 325 MG PO TABS
650.0000 mg | ORAL_TABLET | Freq: Once | ORAL | Status: AC
  Administered 2022-04-18: 650 mg via ORAL

## 2022-04-18 MED ORDER — DIPHENHYDRAMINE HCL 25 MG PO CAPS
ORAL_CAPSULE | ORAL | Status: AC
  Filled 2022-04-18: qty 2

## 2022-04-18 MED ORDER — ACETAMINOPHEN 325 MG PO TABS
ORAL_TABLET | ORAL | Status: AC
  Filled 2022-04-18: qty 2

## 2022-04-18 MED ORDER — SODIUM CHLORIDE 0.9 % IV SOLN
375.0000 mg/m2 | Freq: Once | INTRAVENOUS | Status: AC
  Administered 2022-04-18: 600 mg via INTRAVENOUS
  Filled 2022-04-18: qty 50

## 2022-04-18 MED ORDER — DIPHENHYDRAMINE HCL 25 MG PO CAPS
50.0000 mg | ORAL_CAPSULE | Freq: Once | ORAL | Status: AC
  Administered 2022-04-18: 50 mg via ORAL

## 2022-04-18 MED ORDER — SODIUM CHLORIDE 0.9 % IV SOLN: Freq: Once | INTRAVENOUS | Status: AC

## 2022-04-18 MED ORDER — SODIUM CHLORIDE 0.9% FLUSH
10.0000 mL | INTRAVENOUS | Status: DC | PRN
  Administered 2022-04-18: 10 mL

## 2022-04-18 MED ORDER — HEPARIN SOD (PORK) LOCK FLUSH 100 UNIT/ML IV SOLN: 500.0000 [IU] | Freq: Once | INTRAVENOUS | Status: DC | PRN

## 2022-04-18 NOTE — Patient Instructions (Signed)
[facility]  Discharge Instructions: Thank you for choosing [facility] to provide your oncology and hematology care.  If you have a lab appointment with the [facility], please go directly to the [facility] and check in at the registration area.   Wear comfortable clothing and clothing appropriate for easy access to any Portacath or PICC line.   We strive to give you quality time with your provider. You may need to reschedule your appointment if you arrive late (15 or more minutes).  Arriving late affects you and other patients whose appointments are after yours.  Also, if you miss three or more appointments without notifying the office, you may be dismissed from the clinic at the provider's discretion.      For prescription refill requests, have your pharmacy contact our office and allow 72 hours for refills to be completed.    Today you received the following chemotherapy and/or immunotherapy agents RITUXIMABRituximab Injection What is this medication? RITUXIMAB (ri TUX i mab) treats leukemia and lymphoma. It works by blocking a protein that causes cancer cells to grow and multiply. This helps to slow or stop the spread of cancer cells. It may also be used to treat autoimmune conditions, such as arthritis. It works by slowing down an overactive immune system. It is a monoclonal antibody. This medicine may be used for other purposes; ask your health care provider or pharmacist if you have questions. COMMON BRAND NAME(S): RIABNI, Rituxan, RUXIENCE, truxima What should I tell my care team before I take this medication? They need to know if you have any of these conditions: Chest pain Heart disease Immune system problems Infection, such as chickenpox, cold sores, hepatitis B, herpes Irregular heartbeat or rhythm Kidney disease Low blood counts, such as low white cells, platelets, red cells Lung disease Recent or upcoming vaccine An unusual or  allergic reaction to rituximab, other medications, foods, dyes, or preservatives Pregnant or trying to get pregnant Breast-feeding How should I use this medication? This medication is injected into a vein. It is given by a care team in a hospital or clinic setting. A special MedGuide will be given to you before each treatment. Be sure to read this information carefully each time. Talk to your care team about the use of this medication in children. While this medication may be prescribed for children as young as 6 months for selected conditions, precautions do apply. Overdosage: If you think you have taken too much of this medicine contact a poison control center or emergency room at once. NOTE: This medicine is only for you. Do not share this medicine with others. What if I miss a dose? Keep appointments for follow-up doses. It is important not to miss your dose. Call your care team if you are unable to keep an appointment. What may interact with this medication? Do not take this medication with any of the following: Live vaccines This medication may also interact with the following: Cisplatin This list may not describe all possible interactions. Give your health care provider a list of all the medicines, herbs, non-prescription drugs, or dietary supplements you use. Also tell them if you smoke, drink alcohol, or use illegal drugs. Some items may interact with your medicine. What should I watch for while using this medication? Your condition will be monitored carefully while you are receiving this medication. You may need blood work while taking this medication. This medication can cause serious infusion reactions. To reduce the risk  your care team may give you other medications to take before receiving this one. Be sure to follow the directions from your care team. This medication may increase your risk of getting an infection. Call your care team for advice if you get a fever, chills, sore  throat, or other symptoms of a cold or flu. Do not treat yourself. Try to avoid being around people who are sick. Call your care team if you are around anyone with measles, chickenpox, or if you develop sores or blisters that do not heal properly. Avoid taking medications that contain aspirin, acetaminophen, ibuprofen, naproxen, or ketoprofen unless instructed by your care team. These medications may hide a fever. This medication may cause serious skin reactions. They can happen weeks to months after starting the medication. Contact your care team right away if you notice fevers or flu-like symptoms with a rash. The rash may be red or purple and then turn into blisters or peeling of the skin. You may also notice a red rash with swelling of the face, lips, or lymph nodes in your neck or under your arms. In some patients, this medication may cause a serious brain infection that may cause death. If you have any problems seeing, thinking, speaking, walking, or standing, tell your care team right away. If you cannot reach your care team, urgently seek another source of medical care. Talk to your care team if you may be pregnant. Serious birth defects can occur if you take this medication during pregnancy and for 12 months after the last dose. You will need a negative pregnancy test before starting this medication. Contraception is recommended while taking this medication and for 12 months after the last dose. Your care team can help you find the option that works for you. Do not breastfeed while taking this medication and for at least 6 months after the last dose. What side effects may I notice from receiving this medication? Side effects that you should report to your care team as soon as possible: Allergic reactions or angioedema--skin rash, itching or hives, swelling of the face, eyes, lips, tongue, arms, or legs, trouble swallowing or breathing Bowel blockage--stomach cramping, unable to have a bowel  movement or pass gas, loss of appetite, vomiting Dizziness, loss of balance or coordination, confusion or trouble speaking Heart attack--pain or tightness in the chest, shoulders, arms, or jaw, nausea, shortness of breath, cold or clammy skin, feeling faint or lightheaded Heart rhythm changes--fast or irregular heartbeat, dizziness, feeling faint or lightheaded, chest pain, trouble breathing Infection--fever, chills, cough, sore throat, wounds that don't heal, pain or trouble when passing urine, general feeling of discomfort or being unwell Infusion reactions--chest pain, shortness of breath or trouble breathing, feeling faint or lightheaded Kidney injury--decrease in the amount of urine, swelling of the ankles, hands, or feet Liver injury--right upper belly pain, loss of appetite, nausea, light-colored stool, dark yellow or brown urine, yellowing skin or eyes, unusual weakness or fatigue Redness, blistering, peeling, or loosening of the skin, including inside the mouth Stomach pain that is severe, does not go away, or gets worse Tumor lysis syndrome (TLS)--nausea, vomiting, diarrhea, decrease in the amount of urine, dark urine, unusual weakness or fatigue, confusion, muscle pain or cramps, fast or irregular heartbeat, joint pain Side effects that usually do not require medical attention (report to your care team if they continue or are bothersome): Headache Joint pain Nausea Runny or stuffy nose Unusual weakness or fatigue This list may not describe all possible side effects.  Call your doctor for medical advice about side effects. You may report side effects to FDA at 1-800-FDA-1088. Where should I keep my medication? This medication is given in a hospital or clinic. It will not be stored at home. NOTE: This sheet is a summary. It may not cover all possible information. If you have questions about this medicine, talk to your doctor, pharmacist, or health care provider.  2023 Elsevier/Gold  Standard (2022-01-14 00:00:00)       To help prevent nausea and vomiting after your treatment, we encourage you to take your nausea medication as directed.  BELOW ARE SYMPTOMS THAT SHOULD BE REPORTED IMMEDIATELY: *FEVER GREATER THAN 100.4 F (38 C) OR HIGHER *CHILLS OR SWEATING *NAUSEA AND VOMITING THAT IS NOT CONTROLLED WITH YOUR NAUSEA MEDICATION *UNUSUAL SHORTNESS OF BREATH *UNUSUAL BRUISING OR BLEEDING *URINARY PROBLEMS (pain or burning when urinating, or frequent urination) *BOWEL PROBLEMS (unusual diarrhea, constipation, pain near the anus) TENDERNESS IN MOUTH AND THROAT WITH OR WITHOUT PRESENCE OF ULCERS (sore throat, sores in mouth, or a toothache) UNUSUAL RASH, SWELLING OR PAIN  UNUSUAL VAGINAL DISCHARGE OR ITCHING   Items with * indicate a potential emergency and should be followed up as soon as possible or go to the Emergency Department if any problems should occur.  Please show the CHEMOTHERAPY ALERT CARD or IMMUNOTHERAPY ALERT CARD at check-in to the Emergency Department and triage nurse.  Should you have questions after your visit or need to cancel or reschedule your appointment, please contact [facility]  Dept: [phone]  and follow the prompts.  Office hours are 8:00 a.m. to 4:30 p.m. Monday - Friday. Please note that voicemails left after 4:00 p.m. may not be returned until the following business day.  We are closed weekends and major holidays. You have access to a nurse at all times for urgent questions. Please call the main number to the clinic Dept: [phone] and follow the prompts.  For any non-urgent questions, you may also contact your provider using MyChart. We now offer e-Visits for anyone 21 and older to request care online for non-urgent symptoms. For details visit [email or web address].   Also download the MyChart app! Go to the app store, search "MyChart", open the app, select [facility], and log in with your MyChart username  and password.  Masks are optional in the cancer centers. If you would like for your care team to wear a mask while they are taking care of you, please let them know. You may have one support person who is at least 50 years old accompany you for your appointments.

## 2022-04-19 ENCOUNTER — Other Ambulatory Visit (HOSPITAL_COMMUNITY): Payer: Self-pay

## 2022-04-22 ENCOUNTER — Inpatient Hospital Stay: Payer: Medicaid Other

## 2022-04-22 DIAGNOSIS — D693 Immune thrombocytopenic purpura: Secondary | ICD-10-CM

## 2022-04-22 LAB — CBC AND DIFFERENTIAL
HCT: 33 — AB (ref 36–46)
Hemoglobin: 10.3 — AB (ref 12.0–16.0)
Neutrophils Absolute: 3.33
Platelets: 90 10*3/uL — AB (ref 150–400)
WBC: 4.5

## 2022-04-22 LAB — CBC: RBC: 3.89 (ref 3.87–5.11)

## 2022-04-23 ENCOUNTER — Other Ambulatory Visit (HOSPITAL_COMMUNITY): Payer: Self-pay

## 2022-04-23 ENCOUNTER — Encounter: Payer: Self-pay | Admitting: Oncology

## 2022-04-23 MED FILL — Rituximab-abbs IV Soln 500 MG/50ML (10 MG/ML): INTRAVENOUS | Qty: 60 | Status: AC

## 2022-04-24 ENCOUNTER — Ambulatory Visit: Payer: Self-pay

## 2022-04-24 ENCOUNTER — Inpatient Hospital Stay: Payer: Medicaid Other

## 2022-04-24 VITALS — BP 113/77 | HR 91 | Temp 98.0°F | Resp 18 | Ht 59.0 in | Wt 136.5 lb

## 2022-04-24 DIAGNOSIS — Z5112 Encounter for antineoplastic immunotherapy: Secondary | ICD-10-CM | POA: Diagnosis not present

## 2022-04-24 DIAGNOSIS — D696 Thrombocytopenia, unspecified: Secondary | ICD-10-CM

## 2022-04-24 DIAGNOSIS — D693 Immune thrombocytopenic purpura: Secondary | ICD-10-CM

## 2022-04-24 MED ORDER — SODIUM CHLORIDE 0.9 % IV SOLN
375.0000 mg/m2 | Freq: Once | INTRAVENOUS | Status: AC
  Administered 2022-04-24: 600 mg via INTRAVENOUS
  Filled 2022-04-24: qty 50

## 2022-04-24 MED ORDER — ROMIPLOSTIM 125 MCG ~~LOC~~ SOLR
6.0500 ug/kg | Freq: Once | SUBCUTANEOUS | Status: AC
  Administered 2022-04-24: 375 ug via SUBCUTANEOUS
  Filled 2022-04-24: qty 0.5

## 2022-04-24 MED ORDER — SODIUM CHLORIDE 0.9 % IV SOLN: Freq: Once | INTRAVENOUS | Status: AC

## 2022-04-24 MED ORDER — DIPHENHYDRAMINE HCL 25 MG PO CAPS
50.0000 mg | ORAL_CAPSULE | Freq: Once | ORAL | Status: AC
  Administered 2022-04-24: 50 mg via ORAL

## 2022-04-24 MED ORDER — ACETAMINOPHEN 325 MG PO TABS
650.0000 mg | ORAL_TABLET | Freq: Once | ORAL | Status: AC
  Administered 2022-04-24: 650 mg via ORAL

## 2022-04-24 NOTE — Patient Instructions (Signed)
[facility]  Discharge Instructions: Thank you for choosing [facility] to provide your oncology and hematology care.  If you have a lab appointment with the [facility], please go directly to the [facility] and check in at the registration area.   Wear comfortable clothing and clothing appropriate for easy access to any Portacath or PICC line.   We strive to give you quality time with your provider. You may need to reschedule your appointment if you arrive late (15 or more minutes).  Arriving late affects you and other patients whose appointments are after yours.  Also, if you miss three or more appointments without notifying the office, you may be dismissed from the clinic at the provider's discretion.      For prescription refill requests, have your pharmacy contact our office and allow 72 hours for refills to be completed.    Today you received the following chemotherapy and/or immunotherapy agents RITUXIMABRituximab; Hyaluronidase Injection What is this medication? RITUXIMAB; HYALURONIDASE (ri TUX i mab; hye al ur ON i dase) treats leukemia and lymphoma. Rituximab works by blocking a protein that causes cancer cells to grow and multiply. This helps to slow or stop the spread of cancer cells. Hyaluronidase works by increasing the absorption of the other medications in the body to help them work better. It is a combination medication that contains a monoclonal antibody. This medicine may be used for other purposes; ask your health care provider or pharmacist if you have questions. COMMON BRAND NAME(S): Rituxan [place] What should I tell my care team before I take this medication? They need to know if you have any of these conditions: Heart disease Immune system problems Infection, such as hepatitis B, chickenpox, cold sores, herpes Irregular heartbeat Kidney disease Lung or breathing disease, such as asthma Recently received or scheduled to  receive a vaccine An unusual or allergic reaction to rituximab, rituximab;hyaluronidase, mouse proteins, other medications, foods, dyes, or preservatives Pregnant or trying to get pregnant Breast-feeding How should I use this medication? This medication is for injection under the skin. It is given by a care team in a hospital or clinic setting. A special MedGuide will be given to you before each treatment. Be sure to read this information carefully each time. Talk to your care team about the use of this medication in children. Special care may be needed. Overdosage: If you think you have taken too much of this medicine contact a poison control center or emergency room at once. NOTE: This medicine is only for you. Do not share this medicine with others. What if I miss a dose? Keep appointments for follow-up doses. It is important not to miss your dose. Call your care team if you are unable to keep an appointment. What may interact with this medication? Do not take this medication with any of the following: Live virus vaccines This medication may also interact with the following: Cisplatin This list may not describe all possible interactions. Give your health care provider a list of all the medicines, herbs, non-prescription drugs, or dietary supplements you use. Also tell them if you smoke, drink alcohol, or use illegal drugs. Some items may interact with your medicine. What should I watch for while using this medication? Your condition will be monitored carefully while you are receiving this medication. You may need blood work while taking this medication. This medication can cause serious allergic reactions. To reduce the risk, your care team may give you other  medications to take before receiving this one. Be sure to follow the directions from your care team. In some patients, this medication may cause a serious brain infection that may cause death. If you have any problems seeing, thinking,  speaking, walking, or standing, tell your care team right away. If you cannot reach your care team, urgently seek other source of medical care. This medication may increase your risk of getting an infection. Call your care team for advice if you get a fever, chills, sore throat, or other symptoms of a cold or flu. Do not treat yourself. Try to avoid being around people who are sick. Talk to your care team if you may be pregnant. Serious birth defects can occur if you take this medication during pregnancy and for 12 months after the last dose. Your will need a negative pregnancy test before starting this medication. Contraception is recommended while taking this medication and for 12 months after the last dose. Your care team can help you find the option that works for you. Do not breastfeed while taking this medication and for at least 6 months after the last dose. What side effects may I notice from receiving this medication? Side effects that you should report to your care team as soon as possible: Allergic reactions or angioedema--skin rash, itching or hives, swelling of the face, eyes, lips, tongue, arms, or legs, trouble swallowing or breathing Bowel blockage--stomach cramping, unable to have a bowel movement or pass gas, loss of appetite, vomiting Dizziness, loss of balance or coordination, confusion or trouble speaking Fever, chills, unusual weakness or fatigue, loss of appetite, nausea, headache, dizziness, feeling faint or lightheaded, shortness of breath, fast or irregular heartbeat, which may be signs of cytokine release syndrome Heart attack--pain or tightness in the chest, shoulders, arms, or jaw, nausea, shortness of breath, cold or clammy skin, feeling faint or lightheaded Heart rhythm changes--fast or irregular heartbeat, dizziness, feeling faint or lightheaded, chest pain, trouble breathing Infection--fever, chills, cough, sore throat, wounds that don't heal, pain or trouble when passing  urine, general feeling of discomfort or being unwell Kidney injury--decrease in the amount of urine, swelling of the ankles, hands, or feet Liver injury--right upper belly pain, loss of appetite, nausea, light-colored stool, dark yellow or brown urine, yellowing skin or eyes, unusual weakness or fatigue Redness, blistering, peeling, or loosening of the skin, including inside the mouth Stomach pain that is severe, does not go away, or gets worse Tumor lysis syndrome (TLS)--nausea, vomiting, diarrhea, decrease in the amount of urine, dark urine, unusual weakness or fatigue, confusion, muscle pain or cramps, fast or irregular heartbeat, joint pain Side effects that usually do not require medical attention (report these to your care team if they continue or are bothersome): Constipation Fatigue Hair loss Nausea Pain, redness, or irritation at injection site This list may not describe all possible side effects. Call your doctor for medical advice about side effects. You may report side effects to FDA at 1-800-FDA-1088. Where should I keep my medication? This medication is given in a hospital or clinic. It will not be stored at home. NOTE: This sheet is a summary. It may not cover all possible information. If you have questions about this medicine, talk to your doctor, pharmacist, or health care provider.  2023 Elsevier/Gold Standard (2022-01-16 00:00:00)       To help prevent nausea and vomiting after your treatment, we encourage you to take your nausea medication as directed.  BELOW ARE SYMPTOMS THAT SHOULD BE REPORTED  IMMEDIATELY: *FEVER GREATER THAN 100.4 F (38 C) OR HIGHER *CHILLS OR SWEATING *NAUSEA AND VOMITING THAT IS NOT CONTROLLED WITH YOUR NAUSEA MEDICATION *UNUSUAL SHORTNESS OF BREATH *UNUSUAL BRUISING OR BLEEDING *URINARY PROBLEMS (pain or burning when urinating, or frequent urination) *BOWEL PROBLEMS (unusual diarrhea, constipation, pain near the anus) TENDERNESS IN MOUTH AND  THROAT WITH OR WITHOUT PRESENCE OF ULCERS (sore throat, sores in mouth, or a toothache) UNUSUAL RASH, SWELLING OR PAIN  UNUSUAL VAGINAL DISCHARGE OR ITCHING   Items with * indicate a potential emergency and should be followed up as soon as possible or go to the Emergency Department if any problems should occur.  Please show the CHEMOTHERAPY ALERT CARD or IMMUNOTHERAPY ALERT CARD at check-in to the Emergency Department and triage nurse.  Should you have questions after your visit or need to cancel or reschedule your appointment, please contact [facility]  Dept: [phone]  and follow the prompts.  Office hours are 8:00 a.m. to 4:30 p.m. Monday - Friday. Please note that voicemails left after 4:00 p.m. may not be returned until the following business day.  We are closed weekends and major holidays. You have access to a nurse at all times for urgent questions. Please call the main number to the clinic Dept: [phone] and follow the prompts.  For any non-urgent questions, you may also contact your provider using MyChart. We now offer e-Visits for anyone 1 and older to request care online for non-urgent symptoms. For details visit [email or web address].   Also download the MyChart app! Go to the app store, search "MyChart", open the app, select [facility], and log in with your MyChart username and password.  Masks are optional in the cancer centers. If you would like for your care team to wear a mask while they are taking care of you, please let them know. You may have one support person who is at least 50 years old accompany you for your appointments.

## 2022-04-24 NOTE — Progress Notes (Signed)
Spoke with [facility] and scheduled apts for 08/21 and 08/23.

## 2022-04-26 ENCOUNTER — Other Ambulatory Visit (HOSPITAL_COMMUNITY): Payer: Self-pay

## 2022-04-29 ENCOUNTER — Inpatient Hospital Stay: Payer: Medicaid Other

## 2022-04-29 DIAGNOSIS — D693 Immune thrombocytopenic purpura: Secondary | ICD-10-CM

## 2022-04-29 LAB — CBC AND DIFFERENTIAL
HCT: 34 — AB (ref 36–46)
Hemoglobin: 10.7 — AB (ref 12.0–16.0)
Neutrophils Absolute: 8
Platelets: 162 10*3/uL (ref 150–400)
WBC: 8.6

## 2022-04-29 LAB — CBC: RBC: 4.19 (ref 3.87–5.11)

## 2022-04-29 NOTE — Progress Notes (Signed)
Spoke with [facility] and scheduled transportation for [facility] 05/06/2022 and 05/08/2022.

## 2022-05-01 ENCOUNTER — Other Ambulatory Visit: Payer: Self-pay | Admitting: Pharmacist

## 2022-05-01 ENCOUNTER — Encounter: Payer: Self-pay | Admitting: Oncology

## 2022-05-01 ENCOUNTER — Other Ambulatory Visit (HOSPITAL_COMMUNITY): Payer: Self-pay

## 2022-05-01 ENCOUNTER — Inpatient Hospital Stay: Payer: Medicaid Other

## 2022-05-01 VITALS — BP 133/89 | HR 125 | Temp 97.6°F | Resp 18 | Ht 59.0 in | Wt 137.0 lb

## 2022-05-01 DIAGNOSIS — K7031 Alcoholic cirrhosis of liver with ascites: Secondary | ICD-10-CM

## 2022-05-01 DIAGNOSIS — D696 Thrombocytopenia, unspecified: Secondary | ICD-10-CM

## 2022-05-01 DIAGNOSIS — Z5112 Encounter for antineoplastic immunotherapy: Secondary | ICD-10-CM | POA: Diagnosis not present

## 2022-05-01 MED ORDER — ROMIPLOSTIM 125 MCG ~~LOC~~ SOLR
6.0500 ug/kg | Freq: Once | SUBCUTANEOUS | Status: AC
  Administered 2022-05-01: 375 ug via SUBCUTANEOUS
  Filled 2022-05-01: qty 0.5

## 2022-05-01 MED ORDER — SODIUM CHLORIDE 0.9 % IV SOLN: Freq: Once | INTRAVENOUS | Status: AC

## 2022-05-01 MED ORDER — NADOLOL 20 MG PO TABS: ORAL_TABLET | ORAL | 0 refills | Status: DC

## 2022-05-01 MED ORDER — NADOLOL 20 MG PO TABS
ORAL_TABLET | ORAL | 11 refills | Status: DC
  Filled 2022-05-01: qty 30, 30d supply, fill #0

## 2022-05-01 NOTE — Patient Instructions (Signed)
Dehydration, Adult Dehydration is condition in which there is not enough water or other fluids in the body. This happens when a person loses more fluids than he or she takes in. Important body parts cannot work right without the right amount of fluids. Any loss of fluids from the body can cause dehydration. Dehydration can be mild, worse, or very bad. It should be treated right away to keep it from getting very bad. What are the causes? This condition may be caused by: Conditions that cause loss of water or other fluids, such as: Watery poop (diarrhea). Vomiting. Sweating a lot. Peeing (urinating) a lot. Not drinking enough fluids, especially when you: Are ill. Are doing things that take a lot of energy to do. Other illnesses and conditions, such as fever or infection. Certain medicines, such as medicines that take extra fluid out of the body (diuretics). Lack of safe drinking water. Not being able to get enough water and food. What increases the risk? The following factors may make you more likely to develop this condition: Having a long-term (chronic) illness that has not been treated the right way, such as: Diabetes. Heart disease. Kidney disease. Being 65 years of age or older. Having a disability. Living in a place that is high above the ground or sea (high in altitude). The thinner, dried air causes more fluid loss. Doing exercises that put stress on your body for a long time. What are the signs or symptoms? Symptoms of dehydration depend on how bad it is. Mild or worse dehydration Thirst. Dry lips or dry mouth. Feeling dizzy or light-headed, especially when you stand up from sitting. Muscle cramps. Your body making: Dark pee (urine). Pee may be the color of tea. Less pee than normal. Less tears than normal. Headache. Very bad dehydration Changes in skin. Skin may: Be cold to the touch (clammy). Be blotchy or pale. Not go back to normal right after you lightly pinch  it and let it go. Little or no tears, pee, or sweat. Changes in vital signs, such as: Fast breathing. Low blood pressure. Weak pulse. Pulse that is more than 100 beats a minute when you are sitting still. Other changes, such as: Feeling very thirsty. Eyes that look hollow (sunken). Cold hands and feet. Being mixed up (confused). Being very tired (lethargic) or having trouble waking from sleep. Short-term weight loss. Loss of consciousness. How is this treated? Treatment for this condition depends on how bad it is. Treatment should start right away. Do not wait until your condition gets very bad. Very bad dehydration is an emergency. You will need to go to a hospital. Mild or worse dehydration can be treated at home. You may be asked to: Drink more fluids. Drink an oral rehydration solution (ORS). This drink helps get the right amounts of fluids and salts and minerals in the blood (electrolytes). Very bad dehydration can be treated: With fluids through an IV tube. By getting normal levels of salts and minerals in your blood. This is often done by giving salts and minerals through a tube. The tube is passed through your nose and into your stomach. By treating the root cause. Follow these instructions at home: Oral rehydration solution If told by your doctor, drink an ORS: Make an ORS. Use instructions on the package. Start by drinking small amounts, about  cup (120 mL) every 5-10 minutes. Slowly drink more until you have had the amount that your doctor said to have. Eating and drinking          Drink enough clear fluid to keep your pee pale yellow. If you were told to drink an ORS, finish the ORS first. Then, start slowly drinking other clear fluids. Drink fluids such as: Water. Do not drink only water. Doing that can make the salt (sodium) level in your body get too low. Water from ice chips you suck on. Fruit juice that you have added water to (diluted). Low-calorie sports  drinks. Eat foods that have the right amounts of salts and minerals, such as: Bananas. Oranges. Potatoes. Tomatoes. Spinach. Do not drink alcohol. Avoid: Drinks that have a lot of sugar. These include: High-calorie sports drinks. Fruit juice that you did not add water to. Soda. Caffeine. Foods that are greasy or have a lot of fat or sugar. General instructions Take over-the-counter and prescription medicines only as told by your doctor. Do not take salt tablets. Doing that can make the salt level in your body get too high. Return to your normal activities as told by your doctor. Ask your doctor what activities are safe for you. Keep all follow-up visits as told by your doctor. This is important. Contact a doctor if: You have pain in your belly (abdomen) and the pain: Gets worse. Stays in one place. You have a rash. You have a stiff neck. You get angry or annoyed (irritable) more easily than normal. You are more tired or have a harder time waking than normal. You feel: Weak or dizzy. Very thirsty. Get help right away if you have: Any symptoms of very bad dehydration. Symptoms of vomiting, such as: You cannot eat or drink without vomiting. Your vomiting gets worse or does not go away. Your vomit has blood or green stuff in it. Symptoms that get worse with treatment. A fever. A very bad headache. Problems with peeing or pooping (having a bowel movement), such as: Watery poop that gets worse or does not go away. Blood in your poop (stool). This may cause poop to look black and tarry. Not peeing in 6-8 hours. Peeing only a small amount of very dark pee in 6-8 hours. Trouble breathing. These symptoms may be an emergency. Do not wait to see if the symptoms will go away. Get medical help right away. Call your local emergency services (911 in the [place] Do not drive yourself to the hospital. Summary Dehydration is a condition in which there is not enough water or other fluids  in the body. This happens when a person loses more fluids than he or she takes in. Treatment for this condition depends on how bad it is. Treatment should be started right away. Do not wait until your condition gets very bad. Drink enough clear fluid to keep your pee pale yellow. If you were told to drink an oral rehydration solution (ORS), finish the ORS first. Then, start slowly drinking other clear fluids. Take over-the-counter and prescription medicines only as told by your doctor. Get help right away if you have any symptoms of very bad dehydration. This information is not intended to replace advice given to you by your health care provider. Make sure you discuss any questions you have with your health care provider. Document Revised: 01/02/2022 Document Reviewed: 04/08/2019 Elsevier Patient Education  [place]. Romiplostim Injection What is this medication? ROMIPLOSTIM (roe mi PLOE stim) treats low levels of platelets in your body caused by immune thrombocytopenia (ITP). It is prescribed when other medications have not worked or cannot be tolerated. It may also be used to help people who  have been exposed to high doses of radiation. It works by increasing the amount of platelets in your blood. This lowers the risk of bleeding. This medicine may be used for other purposes; ask your health care provider or pharmacist if you have questions. COMMON BRAND NAME(S): Nplate What should I tell my care team before I take this medication? They need to know if you have any of these conditions: Blood clots Myelodysplastic syndrome An unusual or allergic reaction to romiplostim, mannitol, other medications, foods, dyes, or preservatives Pregnant or trying to get pregnant Breast-feeding How should I use this medication? This medication is injected under the skin. It is given by a care team in a hospital or clinic setting. A special MedGuide will be given to you before each treatment. Be sure  to read this information carefully each time. Talk to your care team about the use of this medication in children. While it may be prescribed for children as young as newborns for selected conditions, precautions do apply. Overdosage: If you think you have taken too much of this medicine contact a poison control center or emergency room at once. NOTE: This medicine is only for you. Do not share this medicine with others. What if I miss a dose? Keep appointments for follow-up doses. It is important not to miss your dose. Call your care team if you are unable to keep an appointment. What may interact with this medication? Interactions are not expected. This list may not describe all possible interactions. Give your health care provider a list of all the medicines, herbs, non-prescription drugs, or dietary supplements you use. Also tell them if you smoke, drink alcohol, or use illegal drugs. Some items may interact with your medicine. What should I watch for while using this medication? Visit your care team for regular checks on your progress. You may need blood work done while you are taking this medication. Your condition will be monitored carefully while you are receiving this medication. It is important not to miss any appointments. What side effects may I notice from receiving this medication? Side effects that you should report to your care team as soon as possible: Allergic reactions--skin rash, itching, hives, swelling of the face, lips, tongue, or throat Blood clot--pain, swelling, or warmth in the leg, shortness of breath, chest pain Side effects that usually do not require medical attention (report to your care team if they continue or are bothersome): Dizziness Joint pain Muscle pain Pain in the hands or feet Stomach pain Trouble sleeping This list may not describe all possible side effects. Call your doctor for medical advice about side effects. You may report side effects to FDA at  1-800-FDA-1088. Where should I keep my medication? This medication is given in a hospital or clinic. It will not be stored at home. NOTE: This sheet is a summary. It may not cover all possible information. If you have questions about this medicine, talk to your doctor, pharmacist, or health care provider.  2023 Elsevier/Gold Standard (2021-12-04 00:00:00)

## 2022-05-01 NOTE — Progress Notes (Signed)
Patient came into clinic today for a nplate injection.  Pulse was found to be 133.  Patient denies chest pain, heart palpitations or shortness of breath.  Patient says that she has been out of her nadolol prescription, which may be contributing to her elevated heart rate.  Her pharmacy, [facility], was contacted and a refill of nadolol was requested to be filled.  The patient was instructed to begin this as soon as possible and to call 911 if she begins developing symptoms.   She also complained of pain in her hand that she described as arthritis.  She says that she has been using voltaren gel and took an ibuprofen this morning for the pain.  I recommended that she contact Dr. Nyra Capes' office and make an appointment to have this evaluated.  She understood and was in agreement.

## 2022-05-04 ENCOUNTER — Other Ambulatory Visit (HOSPITAL_COMMUNITY): Payer: Self-pay

## 2022-05-06 ENCOUNTER — Inpatient Hospital Stay: Payer: Medicaid Other

## 2022-05-06 DIAGNOSIS — D693 Immune thrombocytopenic purpura: Secondary | ICD-10-CM

## 2022-05-06 DIAGNOSIS — K7031 Alcoholic cirrhosis of liver with ascites: Secondary | ICD-10-CM

## 2022-05-06 LAB — CBC AND DIFFERENTIAL
HCT: 32 — AB (ref 36–46)
Hemoglobin: 9.9 — AB (ref 12.0–16.0)
Neutrophils Absolute: 6.63
Platelets: 151 10*3/uL (ref 150–400)
WBC: 7.8

## 2022-05-06 LAB — BASIC METABOLIC PANEL
BUN: 11 (ref 4–21)
CO2: 24 — AB (ref 13–22)
Chloride: 102 (ref 99–108)
Creatinine: 0.5 (ref 0.5–1.1)
Glucose: 160
Potassium: 3.6 mEq/L (ref 3.5–5.1)
Sodium: 131 — AB (ref 137–147)

## 2022-05-06 LAB — CBC: RBC: 3.92 (ref 3.87–5.11)

## 2022-05-06 LAB — HEPATIC FUNCTION PANEL
ALT: 42 U/L — AB (ref 7–35)
AST: 38 — AB (ref 13–35)
Alkaline Phosphatase: 162 — AB (ref 25–125)
Bilirubin, Total: 1.5

## 2022-05-06 LAB — COMPREHENSIVE METABOLIC PANEL
Albumin: 3.1 — AB (ref 3.5–5.0)
Calcium: 8.4 — AB (ref 8.7–10.7)

## 2022-05-07 NOTE — Progress Notes (Signed)
Spoke with [facility], scheduled transportation for DOS 05/14/22 and 05/16/22.

## 2022-05-08 ENCOUNTER — Other Ambulatory Visit: Payer: Self-pay | Admitting: Pharmacist

## 2022-05-08 ENCOUNTER — Inpatient Hospital Stay: Payer: Medicaid Other

## 2022-05-08 VITALS — BP 133/86 | HR 109 | Temp 97.9°F | Resp 18 | Ht 59.0 in | Wt 136.0 lb

## 2022-05-08 DIAGNOSIS — D696 Thrombocytopenia, unspecified: Secondary | ICD-10-CM

## 2022-05-08 DIAGNOSIS — Z5112 Encounter for antineoplastic immunotherapy: Secondary | ICD-10-CM | POA: Diagnosis not present

## 2022-05-08 MED ORDER — ROMIPLOSTIM 125 MCG ~~LOC~~ SOLR
6.0500 ug/kg | Freq: Once | SUBCUTANEOUS | Status: AC
  Administered 2022-05-08: 375 ug via SUBCUTANEOUS
  Filled 2022-05-08: qty 0.5

## 2022-05-08 NOTE — Patient Instructions (Signed)
Romiplostim Injection What is this medication? ROMIPLOSTIM (roe mi PLOE stim) treats low levels of platelets in your body caused by immune thrombocytopenia (ITP). It is prescribed when other medications have not worked or cannot be tolerated. It may also be used to help people who have been exposed to high doses of radiation. It works by increasing the amount of platelets in your blood. This lowers the risk of bleeding. This medicine may be used for other purposes; ask your health care provider or pharmacist if you have questions. COMMON BRAND NAME(S): Nplate What should I tell my care team before I take this medication? They need to know if you have any of these conditions: Blood clots Myelodysplastic syndrome An unusual or allergic reaction to romiplostim, mannitol, other medications, foods, dyes, or preservatives Pregnant or trying to get pregnant Breast-feeding How should I use this medication? This medication is injected under the skin. It is given by a care team in a hospital or clinic setting. A special MedGuide will be given to you before each treatment. Be sure to read this information carefully each time. Talk to your care team about the use of this medication in children. While it may be prescribed for children as young as newborns for selected conditions, precautions do apply. Overdosage: If you think you have taken too much of this medicine contact a poison control center or emergency room at once. NOTE: This medicine is only for you. Do not share this medicine with others. What if I miss a dose? Keep appointments for follow-up doses. It is important not to miss your dose. Call your care team if you are unable to keep an appointment. What may interact with this medication? Interactions are not expected. This list may not describe all possible interactions. Give your health care provider a list of all the medicines, herbs, non-prescription drugs, or dietary supplements you use. Also  tell them if you smoke, drink alcohol, or use illegal drugs. Some items may interact with your medicine. What should I watch for while using this medication? Visit your care team for regular checks on your progress. You may need blood work done while you are taking this medication. Your condition will be monitored carefully while you are receiving this medication. It is important not to miss any appointments. What side effects may I notice from receiving this medication? Side effects that you should report to your care team as soon as possible: Allergic reactions--skin rash, itching, hives, swelling of the face, lips, tongue, or throat Blood clot--pain, swelling, or warmth in the leg, shortness of breath, chest pain Side effects that usually do not require medical attention (report to your care team if they continue or are bothersome): Dizziness Joint pain Muscle pain Pain in the hands or feet Stomach pain Trouble sleeping This list may not describe all possible side effects. Call your doctor for medical advice about side effects. You may report side effects to FDA at 1-800-FDA-1088. Where should I keep my medication? This medication is given in a hospital or clinic. It will not be stored at home. NOTE: This sheet is a summary. It may not cover all possible information. If you have questions about this medicine, talk to your doctor, pharmacist, or health care provider.  2023 Elsevier/Gold Standard (2021-12-04 00:00:00)  

## 2022-05-13 ENCOUNTER — Emergency Department (HOSPITAL_COMMUNITY): Payer: No Typology Code available for payment source

## 2022-05-13 ENCOUNTER — Other Ambulatory Visit: Payer: Self-pay

## 2022-05-13 ENCOUNTER — Observation Stay (HOSPITAL_COMMUNITY)
Admission: EM | Admit: 2022-05-13 | Discharge: 2022-05-15 | Disposition: A | Discharge status: Home / Self Care | Payer: No Typology Code available for payment source | Attending: Internal Medicine | Admitting: Internal Medicine

## 2022-05-13 ENCOUNTER — Encounter (HOSPITAL_COMMUNITY): Payer: Self-pay | Admitting: Pharmacy Technician

## 2022-05-13 DIAGNOSIS — K7031 Alcoholic cirrhosis of liver with ascites: Principal | ICD-10-CM | POA: Diagnosis present

## 2022-05-13 DIAGNOSIS — I8510 Secondary esophageal varices without bleeding: Secondary | ICD-10-CM | POA: Diagnosis present

## 2022-05-13 DIAGNOSIS — Z79899 Other long term (current) drug therapy: Secondary | ICD-10-CM | POA: Insufficient documentation

## 2022-05-13 DIAGNOSIS — F1020 Alcohol dependence, uncomplicated: Secondary | ICD-10-CM | POA: Diagnosis not present

## 2022-05-13 DIAGNOSIS — I81 Portal vein thrombosis: Secondary | ICD-10-CM

## 2022-05-13 DIAGNOSIS — F32A Depression, unspecified: Secondary | ICD-10-CM

## 2022-05-13 DIAGNOSIS — K219 Gastro-esophageal reflux disease without esophagitis: Secondary | ICD-10-CM

## 2022-05-13 DIAGNOSIS — R0602 Shortness of breath: Secondary | ICD-10-CM | POA: Insufficient documentation

## 2022-05-13 DIAGNOSIS — R000 Tachycardia, unspecified: Secondary | ICD-10-CM | POA: Insufficient documentation

## 2022-05-13 DIAGNOSIS — F419 Anxiety disorder, unspecified: Secondary | ICD-10-CM

## 2022-05-13 DIAGNOSIS — R188 Other ascites: Secondary | ICD-10-CM | POA: Diagnosis present

## 2022-05-13 DIAGNOSIS — Z862 Personal history of diseases of the blood and blood-forming organs and certain disorders involving the immune mechanism: Secondary | ICD-10-CM

## 2022-05-13 DIAGNOSIS — G40909 Epilepsy, unspecified, not intractable, without status epilepticus: Secondary | ICD-10-CM

## 2022-05-13 LAB — CBC WITH DIFFERENTIAL/PLATELET
Abs Immature Granulocytes: 0.01 10*3/uL (ref 0.00–0.07)
Basophils Absolute: 0 10*3/uL (ref 0.0–0.1)
Basophils Relative: 1 %
Eosinophils Absolute: 0.2 10*3/uL (ref 0.0–0.5)
Eosinophils Relative: 4 %
HCT: 33.3 % — ABNORMAL LOW (ref 36.0–46.0)
Hemoglobin: 10.3 g/dL — ABNORMAL LOW (ref 12.0–15.0)
Immature Granulocytes: 0 %
Lymphocytes Relative: 31 %
Lymphs Abs: 1.3 10*3/uL (ref 0.7–4.0)
MCH: 24.7 pg — ABNORMAL LOW (ref 26.0–34.0)
MCHC: 30.9 g/dL (ref 30.0–36.0)
MCV: 79.9 fL — ABNORMAL LOW (ref 80.0–100.0)
Monocytes Absolute: 1.3 10*3/uL — ABNORMAL HIGH (ref 0.1–1.0)
Monocytes Relative: 31 %
Neutro Abs: 1.4 10*3/uL — ABNORMAL LOW (ref 1.7–7.7)
Neutrophils Relative %: 33 %
Platelets: 233 10*3/uL (ref 150–400)
RBC: 4.17 MIL/uL (ref 3.87–5.11)
RDW: 19.7 % — ABNORMAL HIGH (ref 11.5–15.5)
WBC: 4.3 10*3/uL (ref 4.0–10.5)
nRBC: 0 % (ref 0.0–0.2)

## 2022-05-13 LAB — URINALYSIS, ROUTINE W REFLEX MICROSCOPIC
Bilirubin Urine: NEGATIVE
Glucose, UA: NEGATIVE mg/dL
Hgb urine dipstick: NEGATIVE
Ketones, ur: NEGATIVE mg/dL
Leukocytes,Ua: NEGATIVE
Nitrite: NEGATIVE
Protein, ur: NEGATIVE mg/dL
Specific Gravity, Urine: 1.02 (ref 1.005–1.030)
pH: 5 (ref 5.0–8.0)

## 2022-05-13 LAB — COMPREHENSIVE METABOLIC PANEL
ALT: 46 U/L — ABNORMAL HIGH (ref 0–44)
AST: 55 U/L — ABNORMAL HIGH (ref 15–41)
Albumin: 2.3 g/dL — ABNORMAL LOW (ref 3.5–5.0)
Alkaline Phosphatase: 155 U/L — ABNORMAL HIGH (ref 38–126)
Anion gap: 10 (ref 5–15)
BUN: 6 mg/dL (ref 6–20)
CO2: 24 mmol/L (ref 22–32)
Calcium: 8 mg/dL — ABNORMAL LOW (ref 8.9–10.3)
Chloride: 101 mmol/L (ref 98–111)
Creatinine, Ser: 0.63 mg/dL (ref 0.44–1.00)
GFR, Estimated: >60 mL/min (ref >60)
Glucose, Bld: 104 mg/dL — ABNORMAL HIGH (ref 70–99)
Potassium: 3.8 mmol/L (ref 3.5–5.1)
Sodium: 135 mmol/L (ref 135–145)
Total Bilirubin: 1.7 mg/dL — ABNORMAL HIGH (ref 0.3–1.2)
Total Protein: 6.4 g/dL — ABNORMAL LOW (ref 6.5–8.1)

## 2022-05-13 LAB — LACTIC ACID, PLASMA
Lactic Acid, Venous: 2.1 mmol/L (ref 0.5–1.9)
Lactic Acid, Venous: 1.9 mmol/L (ref 0.5–1.9)

## 2022-05-13 LAB — MAGNESIUM: Magnesium: 1.5 mg/dL — ABNORMAL LOW (ref 1.7–2.4)

## 2022-05-13 LAB — BRAIN NATRIURETIC PEPTIDE: B Natriuretic Peptide: 19 pg/mL (ref 0.0–100.0)

## 2022-05-13 LAB — LIPASE, BLOOD: Lipase: 65 U/L — ABNORMAL HIGH (ref 11–51)

## 2022-05-13 MED ORDER — LEVETIRACETAM 500 MG PO TABS
750.0000 mg | ORAL_TABLET | Freq: Two times a day (BID) | ORAL | Status: DC
  Administered 2022-05-14 – 2022-05-15 (×4): 750 mg via ORAL
  Filled 2022-05-13 (×4): qty 1

## 2022-05-13 MED ORDER — ADULT MULTIVITAMIN W/MINERALS CH
1.0000 | ORAL_TABLET | Freq: Every day | ORAL | Status: DC
  Administered 2022-05-14 – 2022-05-15 (×2): 1 via ORAL
  Filled 2022-05-13 (×2): qty 1

## 2022-05-13 MED ORDER — LACTULOSE 10 GM/15ML PO SOLN
20.0000 g | Freq: Three times a day (TID) | ORAL | Status: DC
  Administered 2022-05-14: 20 g via ORAL
  Filled 2022-05-13 (×4): qty 30

## 2022-05-13 MED ORDER — METOPROLOL TARTRATE 5 MG/5ML IV SOLN
5.0000 mg | Freq: Once | INTRAVENOUS | Status: AC
  Administered 2022-05-13: 5 mg via INTRAVENOUS
  Filled 2022-05-13: qty 5

## 2022-05-13 MED ORDER — FUROSEMIDE 40 MG PO TABS
40.0000 mg | ORAL_TABLET | Freq: Every day | ORAL | Status: DC
  Administered 2022-05-14 – 2022-05-15 (×2): 40 mg via ORAL
  Filled 2022-05-13 (×2): qty 1

## 2022-05-13 MED ORDER — ENOXAPARIN SODIUM 40 MG/0.4ML IJ SOSY
40.0000 mg | PREFILLED_SYRINGE | INTRAMUSCULAR | Status: DC
  Administered 2022-05-15: 40 mg via SUBCUTANEOUS
  Filled 2022-05-13 (×2): qty 0.4

## 2022-05-13 MED ORDER — FENTANYL CITRATE PF 50 MCG/ML IJ SOSY
50.0000 ug | PREFILLED_SYRINGE | Freq: Once | INTRAMUSCULAR | Status: AC
  Administered 2022-05-13: 50 ug via INTRAVENOUS
  Filled 2022-05-13: qty 1

## 2022-05-13 MED ORDER — ACETAMINOPHEN 325 MG PO TABS
650.0000 mg | ORAL_TABLET | Freq: Four times a day (QID) | ORAL | Status: DC | PRN
  Administered 2022-05-13 – 2022-05-15 (×4): 650 mg via ORAL
  Filled 2022-05-13 (×4): qty 2

## 2022-05-13 MED ORDER — ONDANSETRON HCL 4 MG PO TABS: 4.0000 mg | ORAL_TABLET | Freq: Four times a day (QID) | ORAL | Status: DC | PRN

## 2022-05-13 MED ORDER — TRAZODONE HCL 50 MG PO TABS
25.0000 mg | ORAL_TABLET | Freq: Every evening | ORAL | Status: DC | PRN
  Administered 2022-05-14 (×2): 25 mg via ORAL
  Filled 2022-05-13 (×2): qty 1

## 2022-05-13 MED ORDER — SPIRONOLACTONE 25 MG PO TABS
50.0000 mg | ORAL_TABLET | Freq: Every day | ORAL | Status: DC
  Administered 2022-05-14 – 2022-05-15 (×2): 50 mg via ORAL
  Filled 2022-05-13 (×2): qty 2

## 2022-05-13 MED ORDER — ACETAMINOPHEN 650 MG RE SUPP: 650.0000 mg | Freq: Four times a day (QID) | RECTAL | Status: DC | PRN

## 2022-05-13 MED ORDER — MAGNESIUM HYDROXIDE 400 MG/5ML PO SUSP: 30.0000 mL | Freq: Every day | ORAL | Status: DC | PRN

## 2022-05-13 MED ORDER — ATOVAQUONE 750 MG/5ML PO SUSP
750.0000 mg | Freq: Every day | ORAL | Status: DC
  Administered 2022-05-14 – 2022-05-15 (×2): 750 mg via ORAL
  Filled 2022-05-13 (×2): qty 5

## 2022-05-13 MED ORDER — THIAMINE HCL 100 MG PO TABS
100.0000 mg | ORAL_TABLET | Freq: Every day | ORAL | Status: DC
  Administered 2022-05-14 – 2022-05-15 (×2): 100 mg via ORAL
  Filled 2022-05-13 (×4): qty 1

## 2022-05-13 MED ORDER — ONDANSETRON HCL 4 MG/2ML IJ SOLN: 4.0000 mg | Freq: Four times a day (QID) | INTRAMUSCULAR | Status: DC | PRN

## 2022-05-13 MED ORDER — CLONAZEPAM 0.5 MG PO TABS
0.5000 mg | ORAL_TABLET | Freq: Two times a day (BID) | ORAL | Status: DC | PRN
  Administered 2022-05-14 – 2022-05-15 (×3): 0.5 mg via ORAL
  Filled 2022-05-13 (×3): qty 1

## 2022-05-13 MED ORDER — PANTOPRAZOLE SODIUM 40 MG PO TBEC
40.0000 mg | DELAYED_RELEASE_TABLET | Freq: Every day | ORAL | Status: DC
  Administered 2022-05-14 – 2022-05-15 (×2): 40 mg via ORAL
  Filled 2022-05-13 (×2): qty 1

## 2022-05-13 MED ORDER — POTASSIUM CHLORIDE 20 MEQ PO PACK
20.0000 meq | PACK | Freq: Every day | ORAL | Status: DC
  Administered 2022-05-14 – 2022-05-15 (×2): 20 meq via ORAL
  Filled 2022-05-13 (×2): qty 1

## 2022-05-13 MED ORDER — NADOLOL 20 MG PO TABS
20.0000 mg | ORAL_TABLET | Freq: Every morning | ORAL | Status: DC
  Administered 2022-05-14 – 2022-05-15 (×2): 20 mg via ORAL
  Filled 2022-05-13 (×2): qty 1

## 2022-05-13 MED ORDER — ACAMPROSATE CALCIUM 333 MG PO TBEC
666.0000 mg | DELAYED_RELEASE_TABLET | Freq: Three times a day (TID) | ORAL | Status: DC
  Administered 2022-05-14 – 2022-05-15 (×5): 666 mg via ORAL
  Filled 2022-05-13 (×6): qty 2

## 2022-05-13 NOTE — ED Provider Notes (Signed)
[place] DEPT Provider Note   CSN: [id number] Arrival date & time: 05/13/22  1853     History  Chief Complaint  Patient presents with   Shortness of Breath   Ascites    Briana Sweeney is a 51 y.o. female.  Patient is a 51 year old female who presents with abdominal distention and shortness of breath.  She has a history advanced alcoholic cirrhosis with recurrent ascites, varices, portal hypertension, hepatitis C, seizures, tachycardia.  She says she has not had any alcohol in the last month.  She has had recurrent episodes of ascites with paracentesis.  She has had an admission in July for this.  She also had a follow-up admission at [facility] for upper GI bleed.  She had a recent paracentesis 3 days ago and she states he took off 2 L.  She has had a recurrence of her abdominal distention.  She has some diffuse pain, primarily around her flank areas which she said she has had previously with the ascites.  She says her kidneys have been burning a lot.  She says she has had this before with her ascites.  She denies any urinary symptoms other than she 1 point had a little bit of burning on urination.  No fevers.  She has had some shortness of breath.  She was noted to be hypoxic without was in saturation of 87% on room air by EMS and was placed on nasal cannula at 2 L.  She is noted to be tachycardic with heart rates in the 130s to low 140s.  She states she has chronic tachycardia and usually takes nadolol but she has been out for the last 3 days.       Home Medications Prior to Admission medications   Medication Sig Start Date End Date Taking? Authorizing Provider  acamprosate (CAMPRAL) 333 MG tablet Take 2 tablets (666 mg total) by mouth 3 (three) times daily 04/09/22     acetaminophen (TYLENOL) 500 MG tablet Take 2 tablets (1000 MG) by mouth three times daily as needed for pain 03/21/22     ARIPiprazole (ABILIFY) 10 MG tablet Take 1 tablet by mouth every  day Patient taking differently: Take 10 mg by mouth every morning. 11/22/21     atovaquone (MEPRON) 750 MG/5ML suspension Take by mouth. 04/09/22   [provider]  clonazePAM (KLONOPIN) 0.5 MG tablet TAKE 1 TABLET BY MOUTH TWICE DAILY Patient taking differently: Take 0.5 mg by mouth 2 (two) times daily as needed for anxiety. 02/14/22     diclofenac Sodium (VOLTAREN) 1 % GEL Apply topically.    [provider]  FLUoxetine (PROZAC) 40 MG capsule Take 1 capsule (40 mg total) by mouth daily. Patient taking differently: Take 40 mg by mouth every morning. 11/22/21     furosemide (LASIX) 40 MG tablet Take 40 mg by mouth daily.    [provider]  ibuprofen (ADVIL) 800 MG tablet Take 1 tablet by mouth 3 times a day as needed for pain 03/21/22     lactulose (CHRONULAC) 10 GM/15ML solution Take 30 mLs (20 g total) by mouth 3 (three) times daily. 01/24/22   Aline August, MD  lactulose ([place]) 10 GM/15ML solution Take by mouth. 04/09/22   [provider]  levETIRAcetam (KEPPRA) 750 MG tablet Take 1 tablet (750 mg total) by mouth 2 (two) times daily. 07/06/21   Cameron Sprang, MD  methocarbamol (ROBAXIN) 500 MG tablet Take 1 tablet by mouth twice daily as needed  for muscle spasms Patient not taking: Reported on 04/11/2022 03/21/22     Multiple Vitamins-Minerals (MULTIVITAMIN WITH MINERALS) tablet Take 1 tablet by mouth daily. 03/12/22 03/12/23  Aline August, MD  nadolol (CORGARD) 20 MG tablet Take 1 tablet (20 mg total) by mouth every morning. 02/03/22 03/11/22  Spongberg, Audie Pinto, MD  nadolol (CORGARD) 20 MG tablet Take 1 tablet by mouth daily. 01/03/22   Nehemiah Settle, MD  nadolol (CORGARD) 20 MG tablet Take 1 tablet by mouth daily 05/01/22     OVER THE COUNTER MEDICATION Take 1 capsule by mouth daily. OLLY Ultra brain soft gel    [provider]  pantoprazole (PROTONIX) 40 MG tablet Take by mouth.    [provider]  potassium chloride (KLOR-CON) 20 MEQ  packet Take by mouth.    [provider]  romiPLOStim (NPLATE) 125 MCG injection Inject into the skin. 04/09/22   [provider]  spironolactone (ALDACTONE) 50 MG tablet Take by mouth. 06/30/21   [provider]  thiamine 100 MG tablet Take 1 tablet (100 mg total) by mouth daily. 03/13/22   Aline August, MD      Allergies    Other    Review of Systems   Review of Systems  Constitutional:  Positive for fatigue. Negative for chills, diaphoresis and fever.  HENT:  Negative for congestion, rhinorrhea and sneezing.   Eyes: Negative.   Respiratory:  Positive for shortness of breath. Negative for cough and chest tightness.   Cardiovascular:  Negative for chest pain and leg swelling.  Gastrointestinal:  Positive for abdominal distention and abdominal pain. Negative for blood in stool, diarrhea, nausea and vomiting.  Genitourinary:  Negative for difficulty urinating, flank pain, frequency and hematuria.  Musculoskeletal:  Negative for arthralgias and back pain.  Skin:  Negative for rash.  Neurological:  Negative for dizziness, speech difficulty, weakness, numbness and headaches.    Physical Exam Updated Vital Signs BP 116/74   Pulse (!) 110   Temp 98.8 F (37.1 C)   Resp 16   LMP  (LMP Unknown) Comment: MENAPAUSE AT 51 YEARS OLD  SpO2 97%  Physical Exam Constitutional:      Appearance: She is well-developed. She is ill-appearing.  HENT:     Head: Normocephalic and atraumatic.  Eyes:     Pupils: Pupils are equal, round, and reactive to light.  Cardiovascular:     Rate and Rhythm: Regular rhythm. Tachycardia present.     Heart sounds: Normal heart sounds.  Pulmonary:     Effort: Pulmonary effort is normal. Tachypnea present. No respiratory distress.     Breath sounds: Normal breath sounds. No wheezing or rales.  Chest:     Chest wall: No tenderness.  Abdominal:     General: Bowel sounds are normal.     Palpations: Abdomen is soft.     Tenderness: There  is abdominal tenderness. There is no guarding or rebound.     Comments: Marked distention of the abdomen.  There are some tenderness primarily in the left flank area and generalized tenderness across the abdomen  Musculoskeletal:        General: Normal range of motion.     Cervical back: Normal range of motion and neck supple.  Lymphadenopathy:     Cervical: No cervical adenopathy.  Skin:    General: Skin is warm and dry.     Findings: No rash.  Neurological:     Mental Status: She is alert and oriented to person, place,  and time.     ED Results / Procedures / Treatments   Labs (all labs ordered are listed, but only abnormal results are displayed) Labs Reviewed  COMPREHENSIVE METABOLIC PANEL - Abnormal; Notable for the following components:      Result Value   Glucose, Bld 104 (*)    Calcium 8.0 (*)    Total Protein 6.4 (*)    Albumin 2.3 (*)    AST 55 (*)    ALT 46 (*)    Alkaline Phosphatase 155 (*)    Total Bilirubin 1.7 (*)    All other components within normal limits  LIPASE, BLOOD - Abnormal; Notable for the following components:   Lipase 65 (*)    All other components within normal limits  LACTIC ACID, PLASMA - Abnormal; Notable for the following components:   Lactic Acid, Venous 2.1 (*)    All other components within normal limits  CBC WITH DIFFERENTIAL/PLATELET - Abnormal; Notable for the following components:   Hemoglobin 10.3 (*)    HCT 33.3 (*)    MCV 79.9 (*)    MCH 24.7 (*)    RDW 19.7 (*)    Neutro Abs 1.4 (*)    Monocytes Absolute 1.3 (*)    All other components within normal limits  URINALYSIS, ROUTINE W REFLEX MICROSCOPIC - Abnormal; Notable for the following components:   Color, Urine AMBER (*)    APPearance HAZY (*)    All other components within normal limits  MAGNESIUM - Abnormal; Notable for the following components:   Magnesium 1.5 (*)    All other components within normal limits  BRAIN NATRIURETIC PEPTIDE  LACTIC ACID, PLASMA     EKG EKG Interpretation  Date/Time:  Monday May 13 2022 20:13:35 EDT Ventricular Rate:  131 PR Interval:  127 QRS Duration: 76 QT Interval:  329 QTC Calculation: 486 R Axis:   87 Text Interpretation: Sinus tachycardia Low voltage, extremity leads Borderline prolonged QT interval Confirmed by Malvin Johns [phone]) on 05/13/2022 8:16:02 PM  Radiology CT Abdomen Pelvis Wo Contrast  Result Date: 05/13/2022 CLINICAL DATA:  Abdominal pain, acute, nonlocalized EXAM: CT ABDOMEN AND PELVIS WITHOUT CONTRAST TECHNIQUE: Multidetector CT imaging of the abdomen and pelvis was performed following the standard protocol without IV contrast. RADIATION DOSE REDUCTION: This exam was performed according to the departmental dose-optimization program which includes automated exposure control, adjustment of the mA and/or kV according to patient size and/or use of iterative reconstruction technique. COMPARISON:  03/30/2022 FINDINGS: Lower chest: The visualized lung bases are clear. Cardiac size within normal limits. No pericardial effusion. Numerous gastroesophageal varices are seen surrounding the distal esophagus. Hepatobiliary: The liver is shrunken and the contour is nodular in keeping with changes of advanced cirrhosis. No definite intrahepatic mass identified on this noncontrast examination. Cholelithiasis again noted within a decompressed gallbladder. No intra or extrahepatic biliary ductal dilation. Pancreas: Numerous calcifications are seen within the pancreatic head, stable since prior examination and in keeping with changes of chronic pancreatitis. There is progressive peripancreatic edema involving the body of the pancreas which may represent ascites within the lesser sac or inflammatory changes related to acute pancreatitis. The pancreatic duct is not dilated. Spleen: Normal in size without focal abnormality. Adrenals/Urinary Tract: Adrenal glands are unremarkable. Kidneys are normal, without renal  calculi, focal lesion, or hydronephrosis. Bladder is unremarkable. Stomach/Bowel: There is mild circumferential bowel wall thickening and pericolonic inflammatory stranding involving the distal descending colon which appears similar to prior examination and may represent a small residual  region of infectious or inflammatory colitis. Previously noted bowel wall thickening involving the ascending and transverse colon has improved in the interval. No evidence of obstruction. The stomach, small bowel, and large bowel are otherwise unremarkable. Appendix normal. There is moderate ascites present, stable since prior examination. Vascular/Lymphatic: The gonadal veins are dilated bilaterally, not well assessed on this noncontrast examination. The abdominal vasculature is otherwise unremarkable. No aortic aneurysm. No pathologic adenopathy within the abdomen and pelvis. Reproductive: Uterus and bilateral adnexa are unremarkable. Other: No abdominal wall hernia. Musculoskeletal: No acute bone abnormality. No lytic or blastic bone lesion. IMPRESSION: 1. Morphologic changes in keeping with advanced cirrhosis and portal venous hypertension with numerous gastroesophageal varices and moderate ascites. 2. Cholelithiasis. 3. Progressive peripancreatic edema involving the body of the pancreas which may represent ascites within the lesser sac or inflammatory changes related to acute pancreatitis. Correlation with serum amylase and lipase would be helpful for further evaluation. 4. Interval improvement in bowel wall thickening involving the ascending and transverse colon. Mild residual bowel wall thickening and pericolonic inflammatory stranding involving the distal descending colon may represent a small residual region of infectious or inflammatory colitis. Electronically Signed   By: Fidela Salisbury M.D.   On: 05/13/2022 21:18   DG Chest 2 View  Result Date: 05/13/2022 CLINICAL DATA:  Shortness of breath EXAM: CHEST - 2 VIEW  COMPARISON:  Previous studies including examination done on 03/11/2022 FINDINGS: Cardiac size is within normal limits. There are calcified nodes in left hilum. There is poor inspiration. There are no signs of pulmonary edema or focal pulmonary consolidation. Postsurgical changes are noted in right shoulder. IMPRESSION: There are no signs of pulmonary edema or focal pulmonary consolidation. Electronically Signed   By: Elmer Picker M.D.   On: 05/13/2022 20:44    Procedures Procedures    Medications Ordered in ED Medications  fentaNYL (SUBLIMAZE) injection 50 mcg (50 mcg Intravenous Given 05/13/22 2120)  metoprolol tartrate (LOPRESSOR) injection 5 mg (5 mg Intravenous Given 05/13/22 2147)    ED Course/ Medical Decision Making/ A&P                           Medical Decision Making Amount and/or Complexity of Data Reviewed Labs: ordered. Radiology: ordered.  Risk Prescription drug management. Decision regarding hospitalization.   Patient is a 51 year old who presents with recurrent ascites.  She says her symptoms are similar to prior episodes and she feels like she needs a paracentesis.  She does have some associated shortness of breath.  She initially had some low oxygen saturations but they seem to be maintaining in the 90s on room air currently.  No increased work of breathing.  CT scan shows moderate ascites with some known gallstones.  No signs of cholecystitis.  There is edema in the pancreas.  Suspect this is more from her ascites.  She does not have tenderness that would be more concerning for pancreatitis.  Her lipase is mildly elevated but similar to prior values.  Her white count is normal.  She is afebrile.  Her lactate is mildly elevated but on chart review is similar to prior values.  She is markedly tachycardic with heart rates in the 130s.  However she says she always has tachycardia and she takes nadolol for this.  She has not taken it in the last 2 days.  She was given a dose  of Lopressor in the ED and her heart rate has improved into the 110s.  She has some generalized tenderness to her abdomen but more on the left side.  She said this is similar to her pain that she always has when her ascites increases.  Doubt SBP.  Will consult hospitalist for admission.  I spoke with Dr. Sidney Ace who will admit the pt for further treatment.  Final Clinical Impression(s) / ED Diagnoses Final diagnoses:  Ascites due to alcoholic cirrhosis ([place])  Tachycardia    Rx / DC Orders ED Discharge Orders     None         Malvin Johns, MD 05/13/22 2307

## 2022-05-13 NOTE — ED Triage Notes (Signed)
Pt bib ems from home with reports of cirrhosis and pancreatic cancer. Paracentesis performed Friday, increased ascites since. +abdominal distention, shob. Pt endorses taking lactulose as prescribed. 20g placed en route.  CBG 137 116/70 HR 138 87%, placed on 2L [place]

## 2022-05-14 ENCOUNTER — Observation Stay (HOSPITAL_COMMUNITY): Payer: No Typology Code available for payment source

## 2022-05-14 ENCOUNTER — Inpatient Hospital Stay: Payer: Medicaid Other

## 2022-05-14 ENCOUNTER — Encounter (HOSPITAL_COMMUNITY): Payer: Self-pay | Admitting: Family Medicine

## 2022-05-14 ENCOUNTER — Telehealth: Payer: Self-pay | Admitting: Oncology

## 2022-05-14 DIAGNOSIS — K219 Gastro-esophageal reflux disease without esophagitis: Secondary | ICD-10-CM

## 2022-05-14 DIAGNOSIS — K7011 Alcoholic hepatitis with ascites: Secondary | ICD-10-CM

## 2022-05-14 LAB — BODY FLUID CELL COUNT WITH DIFFERENTIAL
Eos, Fluid: 1 %
Lymphs, Fluid: 18 %
Monocyte-Macrophage-Serous Fluid: 75 % (ref 50–90)
Neutrophil Count, Fluid: 6 % (ref 0–25)
Total Nucleated Cell Count, Fluid: 124 cu mm (ref 0–1000)

## 2022-05-14 LAB — COMPREHENSIVE METABOLIC PANEL
ALT: 40 U/L (ref 0–44)
AST: 48 U/L — ABNORMAL HIGH (ref 15–41)
Albumin: 2.1 g/dL — ABNORMAL LOW (ref 3.5–5.0)
Alkaline Phosphatase: 147 U/L — ABNORMAL HIGH (ref 38–126)
Anion gap: 9 (ref 5–15)
BUN: 8 mg/dL (ref 6–20)
CO2: 23 mmol/L (ref 22–32)
Calcium: 7.8 mg/dL — ABNORMAL LOW (ref 8.9–10.3)
Chloride: 103 mmol/L (ref 98–111)
Creatinine, Ser: 0.54 mg/dL (ref 0.44–1.00)
GFR, Estimated: >60 mL/min (ref >60)
Glucose, Bld: 89 mg/dL (ref 70–99)
Potassium: 3.8 mmol/L (ref 3.5–5.1)
Sodium: 135 mmol/L (ref 135–145)
Total Bilirubin: 1.6 mg/dL — ABNORMAL HIGH (ref 0.3–1.2)
Total Protein: 5.8 g/dL — ABNORMAL LOW (ref 6.5–8.1)

## 2022-05-14 LAB — CBC
HCT: 29.4 % — ABNORMAL LOW (ref 36.0–46.0)
Hemoglobin: 8.9 g/dL — ABNORMAL LOW (ref 12.0–15.0)
MCH: 24.4 pg — ABNORMAL LOW (ref 26.0–34.0)
MCHC: 30.3 g/dL (ref 30.0–36.0)
MCV: 80.5 fL (ref 80.0–100.0)
Platelets: 142 10*3/uL — ABNORMAL LOW (ref 150–400)
RBC: 3.65 MIL/uL — ABNORMAL LOW (ref 3.87–5.11)
RDW: 19.6 % — ABNORMAL HIGH (ref 11.5–15.5)
WBC: 3.2 10*3/uL — ABNORMAL LOW (ref 4.0–10.5)
nRBC: 0 % (ref 0.0–0.2)

## 2022-05-14 LAB — HIV ANTIBODY (ROUTINE TESTING W REFLEX): HIV Screen 4th Generation wRfx: NONREACTIVE

## 2022-05-14 MED ORDER — MAGNESIUM SULFATE 2 GM/50ML IV SOLN
2.0000 g | Freq: Once | INTRAVENOUS | Status: AC
  Administered 2022-05-14: 2 g via INTRAVENOUS
  Filled 2022-05-14: qty 50

## 2022-05-14 MED ORDER — LIDOCAINE HCL 1 % IJ SOLN
INTRAMUSCULAR | Status: AC
  Administered 2022-05-14: 15 mL
  Filled 2022-05-14: qty 20

## 2022-05-14 MED ORDER — METHOCARBAMOL 500 MG PO TABS
500.0000 mg | ORAL_TABLET | Freq: Three times a day (TID) | ORAL | Status: DC | PRN
  Administered 2022-05-14 – 2022-05-15 (×2): 500 mg via ORAL
  Filled 2022-05-14 (×3): qty 1

## 2022-05-14 NOTE — Telephone Encounter (Signed)
05/14/22 Appt cancelled-patient in [facility]

## 2022-05-14 NOTE — Assessment & Plan Note (Signed)
-   We will continue PPI therapy 

## 2022-05-14 NOTE — Progress Notes (Signed)
Patient is a yellow MEWs, HR is 107 and BP is 97/67. NP Olena Heckle was notified. Protocol was initiated.

## 2022-05-14 NOTE — Progress Notes (Signed)
Transition of Care [facility]) Screening Note  Patient Details  Name: Briana Sweeney Date of Birth: October 14, 1970  Transition of Care [facility] CM/SW Contact:    Sherie Don, LCSW Phone Number: 05/14/2022, 10:50 AM  Transition of Care Department [facility]) has reviewed patient and no TOC needs have been identified at this time. We will continue to monitor patient advancement through interdisciplinary progression rounds. If new patient transition needs arise, please place a TOC consult.

## 2022-05-14 NOTE — Progress Notes (Signed)
Mobility Specialist - Progress Note   05/14/22 1107  Mobility  HOB Elevated/Bed Position Self regulated  Activity Ambulated independently in hallway  Range of Motion/Exercises Active  Level of Assistance Independent  Assistive Device None  Distance Ambulated (ft) 250 ft  Activity Response Tolerated well  Transport method Ambulatory  $Mobility charge 1 Mobility   Pt received in bed and agreeable to mobility. Pt to bathroom after session with all needs met.     [facility]

## 2022-05-14 NOTE — H&P (Signed)
[place]   PATIENT NAME: Briana Sweeney    MR#:  [id number]  DATE OF BIRTH:  07/04/1971  DATE OF ADMISSION:  05/13/2022  PRIMARY CARE PHYSICIAN: Marco Collie, MD   Patient is coming from: Home  REQUESTING/REFERRING PHYSICIAN: Malvin Johns, MD  CHIEF COMPLAINT:   Chief Complaint  Patient presents with   Shortness of Breath   Ascites    HISTORY OF PRESENT ILLNESS:  Briana Sweeney is a 51 y.o. Caucasian female with medical history significant for advanced alcoholic cirrhosis with recurrent ascites requiring paracentesis, esophageal varices with portal hypertension, hepatitis C and seizure disorder, who presented to the emergency room with acute onset of worsening abdominal distention with associated discomfort as well as left flank pain.  She admits to dyspnea without cough or wheezing.  She has been having some nausea this morning without vomiting or diarrhea or constipation.  No fever or chills.  No dysuria, oliguria or hematuria or flank pain.  She just had paracentesis with withdrawal of 2 L on Friday.  Pulse oximetry was 89% on room air on room air.  ED Course: When she came to the ER, heart rate was 132 with respiratory rate of 22, pulse ox was 89% on room air briefly and later on up to 94%.  Labs revealed CMP with calcium of 8, AST 55 ALT 46 and total protein 6.4 albumin of 2.3 alk phos of 155 and lipase of 65 with magnesium of 1.5 and total bili 1.7.  BNP was only 19 and lactic acid was 2.1.  CBC showed anemia better than previous levels with microcytosis. EKG as reviewed by me : EKG showed sinus tachycardia with rate 131 with poor R wave progression and low voltage QRS with borderline prolonged QT interval with QTc of 486 MS. Imaging: Two-view chest x-ray showed no acute abnormalities.Abdominal pelvic CT scan revealed the following: 1. Morphologic changes in keeping with advanced cirrhosis and portal venous hypertension with numerous gastroesophageal varices  and moderate ascites. 2. Cholelithiasis. 3. Progressive peripancreatic edema involving the body of the pancreas which may represent ascites within the lesser sac or inflammatory changes related to acute pancreatitis. Correlation with serum amylase and lipase would be helpful for further evaluation. 4. Interval improvement in bowel wall thickening involving the ascending and transverse colon. Mild residual bowel wall thickening and pericolonic inflammatory stranding involving the distal descending colon may represent a small residual region of infectious or inflammatory colitis.  The patient was given 50 mcg of IV fentanyl and 5 mg of IV Lopressor.  She admitted to an medical observation bed for further evaluation and management. PAST MEDICAL HISTORY:   Past Medical History:  Diagnosis Date   Abscess of bursa of left elbow 09/23/2018   Acute hyperactive alcohol withdrawal delirium ([place]) 10/09/2017   Acute low back pain with bilateral sciatica    Acute metabolic encephalopathy 87/86/7672   Acute on chronic pancreatitis ([place]) 04/04/2021   AKI (acute kidney injury) ([place]) 04/04/2021   Alcohol withdrawal syndrome with complication ([place]) 09/47/0962   Alcohol-induced acute pancreatitis    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[place]) 12/05/2018   Ascites due to alcoholic cirrhosis ([place])    Bipolar affective disorder ([place])    With anxiety features   Bunionette of right foot 11/2019   Cirrhosis of liver ([place])    Due to alcohol and hepatitis C   Cocaine dependence without complication ([place]) 83/66/2947   Decompensated hepatic cirrhosis ([place]) 07/23/2017   Depression with anxiety    Epistaxis  01/20/2019   Erysipelas 09/13/2018   Esophageal 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 ([place])    Esophageal varices without bleeding (H[facility])    ETOHism (H[facility])    GERD (gastroesophageal reflux disease)    Head injury    Hematemesis 02/10/2018   Hepatitis C 2018   hepatitis c and alcohol related hepatitis   Heroin abuse ([place])     History of blood transfusion    "blood doesn't clot; I fell down and had to have a transfusion"   History of kidney stones    Menopause 2016   Migraine    "when I get really stressed" (09/01/2017)   Neuropathy 10/27/2018   Olecranon bursitis of left elbow    Other mixed anxiety disorders 10/27/2018   Overdose 08/22/2018   Pancytopenia ([place]) 07/23/2017   Polysubstance abuse ([place]) 04/08/2018   Portal hypertension ([place]) 03/18/2018   S/P foot surgery, right 11/2019   Schizophrenia ([place])    Seizures ([place])    "when I run out of my RX; lots recently" (09/01/2017)   Suicidal ideation    Thrombocytopenia ([place])    Trichimoniasis 10/30/2017   Upper GI bleed 02/10/2018   Urinary urgency 05/2020   UTI (urinary tract infection) 06/02/2017    PAST SURGICAL HISTORY:   Past Surgical History:  Procedure Laterality Date   BUNIONECTOMY     ESOPHAGEAL BANDING  06/26/2021   Procedure: ESOPHAGEAL BANDING;  Surgeon: Jackquline Denmark, MD;  Location: [facility] ENDOSCOPY;  Service: Endoscopy;;   ESOPHAGOGASTRODUODENOSCOPY N/A 09/03/2017   Procedure: ESOPHAGOGASTRODUODENOSCOPY (EGD);  Surgeon: Doran Stabler, MD;  Location: [facility];  Service: Gastroenterology;  Laterality: N/A;   ESOPHAGOGASTRODUODENOSCOPY N/A 02/01/2022   Procedure: ESOPHAGOGASTRODUODENOSCOPY (EGD);  Surgeon: Clarene Essex, MD;  Location: [facility] ENDOSCOPY;  Service: Gastroenterology;  Laterality: N/A;   ESOPHAGOGASTRODUODENOSCOPY (EGD) WITH PROPOFOL N/A 06/26/2021   Procedure: ESOPHAGOGASTRODUODENOSCOPY (EGD) WITH PROPOFOL;  Surgeon: Jackquline Denmark, MD;  Location: [facility] ENDOSCOPY;  Service: Endoscopy;  Laterality: N/A;   FINGER FRACTURE SURGERY Left    "shattered my pinky"   FRACTURE SURGERY     I & D EXTREMITY Left 09/18/2018   Procedure: IRRIGATION AND DEBRIDEMENT EXTREMITY;  Surgeon: Leanora Cover, MD;  Location: [facility] ORS;  Service: Orthopedics;  Laterality: Left;   IR PARACENTESIS  07/23/2017   IR PARACENTESIS  07/2017   "did it twice in the  same week" (09/01/2017)   IR PARACENTESIS  06/26/2021   SHOULDER OPEN ROTATOR CUFF REPAIR Right    TOOTH EXTRACTION  08/2019   TUBAL LIGATION     VAGINAL HYSTERECTOMY      SOCIAL HISTORY:   Social History   Tobacco Use   Smoking status: Never   Smokeless tobacco: Never  Substance Use Topics   Alcohol use: Yes    Alcohol/week: 6.0 standard drinks of alcohol    Types: 6 Cans of beer per week    Comment: pt states she had 1 can beer on 02/26/22    FAMILY HISTORY:   Family History  Problem Relation Age of Onset   Lung cancer Mother 81   Alcohol abuse Mother    Throat cancer Father 43    DRUG ALLERGIES:   Allergies  Allergen Reactions   Other Other (See Comments)    Platelets: Rx chest pain, tremors, body aches  Patient stated that she receives platelet injection every Wednesday     REVIEW OF SYSTEMS:   ROS As per history of present illness. All pertinent systems were reviewed above. Constitutional, HEENT, cardiovascular, respiratory, GI, GU, musculoskeletal, neuro,  psychiatric, endocrine, integumentary and hematologic systems were reviewed and are otherwise negative/unremarkable except for positive findings mentioned above in the HPI.   MEDICATIONS AT HOME:   Prior to Admission medications   Medication Sig Start Date End Date Taking? Authorizing Provider  ARIPiprazole (ABILIFY) 10 MG tablet Take 1 tablet by mouth every day Patient taking differently: Take 10 mg by mouth every morning. 11/22/21  Yes   atovaquone (MEPRON) 750 MG/5ML suspension Take by mouth. 04/09/22  Yes [provider]  clonazePAM (KLONOPIN) 0.5 MG tablet TAKE 1 TABLET BY MOUTH TWICE DAILY Patient taking differently: Take 0.5 mg by mouth 2 (two) times daily as needed for anxiety. 02/14/22  Yes   diclofenac Sodium (VOLTAREN) 1 % GEL Apply 2 g topically daily. Apply to hands   Yes [provider]  FLUoxetine (PROZAC) 40 MG capsule Take 1 capsule (40 mg total) by mouth daily. Patient  taking differently: Take 40 mg by mouth every morning. 11/22/21  Yes   furosemide (LASIX) 40 MG tablet Take 40 mg by mouth daily.   Yes [provider]  lactulose (CHRONULAC) 10 GM/15ML solution Take 30 mLs (20 g total) by mouth 3 (three) times daily. 01/24/22  Yes Aline August, MD  lactulose (CHRONULAC) 10 GM/15ML solution Take 20 g by mouth daily. 04/09/22  Yes [provider]  levETIRAcetam (KEPPRA) 750 MG tablet Take 1 tablet (750 mg total) by mouth 2 (two) times daily. 07/06/21  Yes Cameron Sprang, MD  Multiple Vitamins-Minerals (MULTIVITAMIN WITH MINERALS) tablet Take 1 tablet by mouth daily. 03/12/22 03/12/23 Yes Aline August, MD  nadolol (CORGARD) 20 MG tablet Take 1 tablet (20 mg total) by mouth every morning. 02/03/22 05/15/23 Yes Spongberg, Audie Pinto, MD  OVER THE COUNTER MEDICATION Take 1 capsule by mouth daily. OLLY Ultra brain soft gel   Yes [provider]  pantoprazole (PROTONIX) 40 MG tablet Take 40 mg by mouth daily.   Yes [provider]  spironolactone (ALDACTONE) 50 MG tablet Take 100 mg by mouth daily. 06/30/21  Yes [provider]  thiamine 100 MG tablet Take 1 tablet (100 mg total) by mouth daily. 03/13/22  Yes Aline August, MD  acamprosate (CAMPRAL) 333 MG tablet Take 2 tablets (666 mg total) by mouth 3 (three) times daily Patient not taking: Reported on 05/13/2022 04/09/22     acetaminophen (TYLENOL) 500 MG tablet Take 2 tablets (1000 MG) by mouth three times daily as needed for pain Patient not taking: Reported on 05/13/2022 03/21/22     ibuprofen (ADVIL) 800 MG tablet Take 1 tablet by mouth 3 times a day as needed for pain Patient not taking: Reported on 05/13/2022 03/21/22     methocarbamol (ROBAXIN) 500 MG tablet Take 1 tablet by mouth twice daily as needed for muscle spasms Patient not taking: Reported on 04/11/2022 03/21/22     nadolol (CORGARD) 20 MG tablet Take 1 tablet by mouth daily. Patient not taking: Reported on 05/14/2022 01/03/22    Nehemiah Settle, MD  potassium chloride (KLOR-CON) 20 MEQ packet Take by mouth. Patient not taking: Reported on 05/14/2022    [provider]  romiPLOStim (NPLATE) 125 MCG injection Inject into the skin. Patient not taking: Reported on 05/14/2022 04/09/22   [provider]      VITAL SIGNS:  Blood pressure 129/85, pulse (!) 109, temperature 98.2 F (36.8 C), temperature source Oral, resp. rate 16, height $RemoveBe'4\' 11"'LuuTMRBWZ$  (1.499 m), weight 63 kg, SpO2 99 %.  PHYSICAL EXAMINATION:  Physical Exam  GENERAL:  50 y.o.-year-old Caucasian female patient lying in the bed with no acute distress.  EYES: Pupils equal, round, reactive to light and accommodation. No scleral icterus. Extraocular muscles intact.  HEENT: Head atraumatic, normocephalic. Oropharynx and nasopharynx clear.  NECK:  Supple, no jugular venous distention. No thyroid enlargement, no tenderness.  LUNGS: Normal breath sounds bilaterally, no wheezing, rales,rhonchi or crepitation. No use of accessory muscles of respiration.  CARDIOVASCULAR: Regular rate and rhythm, S1, S2 normal. No murmurs, rubs, or gallops.  ABDOMEN: Soft, distended with positive shifting dullness and nontender. Bowel sounds present. No organomegaly or mass could be palpated due to significant ascites.  EXTREMITIES: No pedal edema, cyanosis, or clubbing.  NEUROLOGIC: Cranial nerves II through XII are intact. Muscle strength 5/5 in all extremities. Sensation intact. Gait not checked.  PSYCHIATRIC: The patient is alert and oriented x 3.  Normal affect and good eye contact. SKIN: No obvious rash, lesion, or ulcer.   LABORATORY PANEL:   CBC Recent Labs  Lab 05/13/22 1941  WBC 4.3  HGB 10.3*  HCT 33.3*  PLT 233   ------------------------------------------------------------------------------------------------------------------  Chemistries  Recent Labs  Lab 05/13/22 1941  NA 135  K 3.8  CL 101  CO2 24  GLUCOSE 104*  BUN 6  CREATININE 0.63   CALCIUM 8.0*  MG 1.5*  AST 55*  ALT 46*  ALKPHOS 155*  BILITOT 1.7*   ------------------------------------------------------------------------------------------------------------------  Cardiac Enzymes No results for input(s): "TROPONINI" in the last 168 hours. ------------------------------------------------------------------------------------------------------------------  RADIOLOGY:  CT Abdomen Pelvis Wo Contrast  Result Date: 05/13/2022 CLINICAL DATA:  Abdominal pain, acute, nonlocalized EXAM: CT ABDOMEN AND PELVIS WITHOUT CONTRAST TECHNIQUE: Multidetector CT imaging of the abdomen and pelvis was performed following the standard protocol without IV contrast. RADIATION DOSE REDUCTION: This exam was performed according to the departmental dose-optimization program which includes automated exposure control, adjustment of the mA and/or kV according to patient size and/or use of iterative reconstruction technique. COMPARISON:  03/30/2022 FINDINGS: Lower chest: The visualized lung bases are clear. Cardiac size within normal limits. No pericardial effusion. Numerous gastroesophageal varices are seen surrounding the distal esophagus. Hepatobiliary: The liver is shrunken and the contour is nodular in keeping with changes of advanced cirrhosis. No definite intrahepatic mass identified on this noncontrast examination. Cholelithiasis again noted within a decompressed gallbladder. No intra or extrahepatic biliary ductal dilation. Pancreas: Numerous calcifications are seen within the pancreatic head, stable since prior examination and in keeping with changes of chronic pancreatitis. There is progressive peripancreatic edema involving the body of the pancreas which may represent ascites within the lesser sac or inflammatory changes related to acute pancreatitis. The pancreatic duct is not dilated. Spleen: Normal in size without focal abnormality. Adrenals/Urinary Tract: Adrenal glands are unremarkable. Kidneys  are normal, without renal calculi, focal lesion, or hydronephrosis. Bladder is unremarkable. Stomach/Bowel: There is mild circumferential bowel wall thickening and pericolonic inflammatory stranding involving the distal descending colon which appears similar to prior examination and may represent a small residual region of infectious or inflammatory colitis. Previously noted bowel wall thickening involving the ascending and transverse colon has improved in the interval. No evidence of obstruction. The stomach, small bowel, and large bowel are otherwise unremarkable. Appendix normal. There is moderate ascites present, stable since prior examination. Vascular/Lymphatic: The gonadal veins are dilated bilaterally, not well assessed on this noncontrast examination. The abdominal vasculature is otherwise unremarkable. No aortic aneurysm. No pathologic adenopathy within the abdomen and pelvis. Reproductive: Uterus and bilateral adnexa are unremarkable. Other: No abdominal wall  hernia. Musculoskeletal: No acute bone abnormality. No lytic or blastic bone lesion. IMPRESSION: 1. Morphologic changes in keeping with advanced cirrhosis and portal venous hypertension with numerous gastroesophageal varices and moderate ascites. 2. Cholelithiasis. 3. Progressive peripancreatic edema involving the body of the pancreas which may represent ascites within the lesser sac or inflammatory changes related to acute pancreatitis. Correlation with serum amylase and lipase would be helpful for further evaluation. 4. Interval improvement in bowel wall thickening involving the ascending and transverse colon. Mild residual bowel wall thickening and pericolonic inflammatory stranding involving the distal descending colon may represent a small residual region of infectious or inflammatory colitis. Electronically Signed   By: Fidela Salisbury M.D.   On: 05/13/2022 21:18   DG Chest 2 View  Result Date: 05/13/2022 CLINICAL DATA:  Shortness of breath  EXAM: CHEST - 2 VIEW COMPARISON:  Previous studies including examination done on 03/11/2022 FINDINGS: Cardiac size is within normal limits. There are calcified nodes in left hilum. There is poor inspiration. There are no signs of pulmonary edema or focal pulmonary consolidation. Postsurgical changes are noted in right shoulder. IMPRESSION: There are no signs of pulmonary edema or focal pulmonary consolidation. Electronically Signed   By: Elmer Picker M.D.   On: 05/13/2022 20:44      IMPRESSION AND PLAN:  Assessment and Plan: * Ascites - This is recurrent ascites in the setting of advanced post hepatitis C and alcoholic cirrhosis.  She has no clear evidence to suspect SBP. - She will be admitted to an observation medical bed. - IR consult will be obtained in a.m. for paracentesis. - Pain management will be provided. - We will continue her lactulose and Aldactone, Lasix as well as nadolol.  Hypomagnesemia - Magnesium will be replaced.    Anxiety and depression - We will continue Klonopin and Prozac.  Alcohol use disorder, severe, dependence ([place]) - She will be monitored for alcohol withdrawal. - We will continue her Campral.  GERD without esophagitis - We will continue PPI therapy  Seizure disorder ([place]) - We will continue Keppra.    DVT prophylaxis: Lovenox.  Advanced Care Planning:  Code Status: full code.  Family Communication:  The plan of care was discussed in details with the patient (and family). I answered all questions. The patient agreed to proceed with the above mentioned plan. Further management will depend upon hospital course. Disposition Plan: Back to previous home environment Consults called: none.  All the records are reviewed and case discussed with ED provider.  Status is: Observation   I certify that at the time of admission, it is my clinical judgment that the patient will require hospital care extending less than 2 midnights.                             Dispo: The patient is from: Home              Anticipated d/c is to: Home              Patient currently is not medically stable to d/c.              Difficult to place patient: No  Christel Mormon M.D on 05/14/2022 at 2:25 AM  [facility]   From 7 PM-7 AM, contact night-coverage [email or web address]  CC: Primary care physician; Marco Collie, MD

## 2022-05-14 NOTE — Assessment & Plan Note (Signed)
-   We will continue Keppra. 

## 2022-05-14 NOTE — Procedures (Signed)
PROCEDURE SUMMARY:  Successful ultrasound guided paracentesis from the left lower quadrant.  Yielded 1.6 L of clear yellow fluid.  No immediate complications.  The patient tolerated the procedure well.   Specimen sent for labs.  EBL < 46m  The patient has previously been formally evaluated by the [facility] and is being actively followed for potential future intervention.  Patient is currently being followed by [facility] per notes in Care Everywhere.    JSoyla Dryer ASouth [phone] 05/14/2022, 3:39 PM

## 2022-05-14 NOTE — Assessment & Plan Note (Addendum)
-   Magnesium will be replaced.

## 2022-05-14 NOTE — Assessment & Plan Note (Addendum)
-   This is recurrent ascites in the setting of advanced post hepatitis C and alcoholic cirrhosis.  She has no clear evidence to suspect SBP. - She will be admitted to an observation medical bed. - IR consult will be obtained in a.m. for paracentesis. - Pain management will be provided. - We will continue her lactulose and Aldactone, Lasix as well as nadolol.

## 2022-05-14 NOTE — Progress Notes (Signed)
Patient seen and examined this morning, admitted overnight, H&P reviewed and agree with the assessment and plan.   51 yo F with known ETOH cirrhosis, now abstinent for few months, comes to the hospital with worsening abdominal distention, discomfort, and worsening right upper quadrant pain wrapping around her back into her kidney.  She recently had paracentesis on Friday and 2 L were removed.  She underwent a repeat paracentesis today, surprisingly only 1.6 L removed, and afterwards, patient continues to have significant right upper quadrant pain.  Obtain liver Doppler to rule out portal vein thrombosis.  Cell count and Gram stain pending to rule out SBP.  Leani Myron M. Cruzita Lederer, MD, PhD [facility]  Between 7 am - 7 pm you can contact me via Amion (for emergencies) or [place] (non urgent matters).  I am not available 7 pm - 7 am, please contact night coverage MD/APP via Amion

## 2022-05-14 NOTE — Assessment & Plan Note (Signed)
-   She will be monitored for alcohol withdrawal. - We will continue her Campral.

## 2022-05-14 NOTE — Assessment & Plan Note (Signed)
-   We will continue Klonopin and Prozac.

## 2022-05-15 ENCOUNTER — Encounter: Payer: Self-pay | Admitting: Oncology

## 2022-05-15 ENCOUNTER — Other Ambulatory Visit (HOSPITAL_COMMUNITY): Payer: Self-pay

## 2022-05-15 DIAGNOSIS — I81 Portal vein thrombosis: Secondary | ICD-10-CM

## 2022-05-15 DIAGNOSIS — Z862 Personal history of diseases of the blood and blood-forming organs and certain disorders involving the immune mechanism: Secondary | ICD-10-CM

## 2022-05-15 DIAGNOSIS — K7031 Alcoholic cirrhosis of liver with ascites: Secondary | ICD-10-CM | POA: Diagnosis not present

## 2022-05-15 LAB — COMPREHENSIVE METABOLIC PANEL
ALT: 38 U/L (ref 0–44)
AST: 53 U/L — ABNORMAL HIGH (ref 15–41)
Albumin: 2.1 g/dL — ABNORMAL LOW (ref 3.5–5.0)
Alkaline Phosphatase: 134 U/L — ABNORMAL HIGH (ref 38–126)
Anion gap: 8 (ref 5–15)
BUN: 8 mg/dL (ref 6–20)
CO2: 21 mmol/L — ABNORMAL LOW (ref 22–32)
Calcium: 7.7 mg/dL — ABNORMAL LOW (ref 8.9–10.3)
Chloride: 102 mmol/L (ref 98–111)
Creatinine, Ser: 0.56 mg/dL (ref 0.44–1.00)
GFR, Estimated: >60 mL/min (ref >60)
Glucose, Bld: 94 mg/dL (ref 70–99)
Potassium: 3.9 mmol/L (ref 3.5–5.1)
Sodium: 131 mmol/L — ABNORMAL LOW (ref 135–145)
Total Bilirubin: 2.2 mg/dL — ABNORMAL HIGH (ref 0.3–1.2)
Total Protein: 5.6 g/dL — ABNORMAL LOW (ref 6.5–8.1)

## 2022-05-15 LAB — CBC
HCT: 32.5 % — ABNORMAL LOW (ref 36.0–46.0)
Hemoglobin: 9.5 g/dL — ABNORMAL LOW (ref 12.0–15.0)
MCH: 24.4 pg — ABNORMAL LOW (ref 26.0–34.0)
MCHC: 29.2 g/dL — ABNORMAL LOW (ref 30.0–36.0)
MCV: 83.3 fL (ref 80.0–100.0)
Platelets: 128 10*3/uL — ABNORMAL LOW (ref 150–400)
RBC: 3.9 MIL/uL (ref 3.87–5.11)
RDW: 19.7 % — ABNORMAL HIGH (ref 11.5–15.5)
WBC: 3 10*3/uL — ABNORMAL LOW (ref 4.0–10.5)
nRBC: 0 % (ref 0.0–0.2)

## 2022-05-15 LAB — MAGNESIUM: Magnesium: 1.8 mg/dL (ref 1.7–2.4)

## 2022-05-15 LAB — PHOSPHORUS: Phosphorus: 2.5 mg/dL (ref 2.5–4.6)

## 2022-05-15 MED ORDER — POTASSIUM CHLORIDE 20 MEQ PO PACK
20.0000 meq | PACK | Freq: Every day | ORAL | 0 refills | Status: DC
  Filled 2022-05-15: qty 30, 30d supply, fill #0

## 2022-05-15 MED ORDER — NADOLOL 20 MG PO TABS
20.0000 mg | ORAL_TABLET | Freq: Every morning | ORAL | 0 refills | Status: DC
Start: 2022-05-15 — End: 2022-07-22
  Filled 2022-05-15: qty 30, 30d supply, fill #0

## 2022-05-15 NOTE — Plan of Care (Signed)
Patient was given discharge instruction, patient understood orders. All questions were answered. Patient is stable and ready for discharge. Patient will be taken downstairs to exit per wheelchair. Dawson Bills, RN

## 2022-05-15 NOTE — Plan of Care (Signed)

## 2022-05-15 NOTE — Discharge Summary (Signed)
Physician Discharge Summary  Briana Sweeney YOV:[id number] DOB: 03-19-1971 DOA: 05/13/2022  PCP: Marco Collie, MD  Admit date: 05/13/2022 Discharge date: 05/15/2022  Admitted From: Home Disposition:  Home  Recommendations for Outpatient Follow-up:  Follow up with [facility] as scheduled 9/29 as well as Dr. [name] oncology as scheduled 9/11  Discharge Condition: Stable CODE STATUS: Full code Diet recommendation: Heart healthy diet  Brief/Interim Summary: From H&P by Dr. Sidney Ace: "Briana Sweeney is a 51 y.o. Caucasian female with medical history significant for advanced alcoholic cirrhosis with recurrent ascites requiring paracentesis, esophageal varices with portal hypertension, hepatitis C and seizure disorder, who presented to the emergency room with acute onset of worsening abdominal distention with associated discomfort as well as left flank pain.  She admits to dyspnea without cough or wheezing.  She has been having some nausea this morning without vomiting or diarrhea or constipation.  No fever or chills.  No dysuria, oliguria or hematuria or flank pain.  She just had paracentesis with withdrawal of 2 L on Friday.  Pulse oximetry was 89% on room air on room air.   ED Course: When she came to the ER, heart rate was 132 with respiratory rate of 22, pulse ox was 89% on room air briefly and later on up to 94%.  Labs revealed CMP with calcium of 8, AST 55 ALT 46 and total protein 6.4 albumin of 2.3 alk phos of 155 and lipase of 65 with magnesium of 1.5 and total bili 1.7.  BNP was only 19 and lactic acid was 2.1.  CBC showed anemia better than previous levels with microcytosis. EKG as reviewed by me : EKG showed sinus tachycardia with rate 131 with poor R wave progression and low voltage QRS with borderline prolonged QT interval with QTc of 486 MS. Imaging: Two-view chest x-ray showed no acute abnormalities.Abdominal pelvic CT scan revealed the following: 1. Morphologic changes in  keeping with advanced cirrhosis and portal venous hypertension with numerous gastroesophageal varices and moderate ascites. 2. Cholelithiasis. 3. Progressive peripancreatic edema involving the body of the pancreas which may represent ascites within the lesser sac or inflammatory changes related to acute pancreatitis. Correlation with serum amylase and lipase would be helpful for further evaluation. 4. Interval improvement in bowel wall thickening involving the ascending and transverse colon. Mild residual bowel wall thickening and pericolonic inflammatory stranding involving the distal descending colon may represent a small residual region of infectious or inflammatory colitis.   The patient was given 50 mcg of IV fentanyl and 5 mg of IV Lopressor.  She admitted to an medical observation bed for further evaluation and management."   Patient underwent paracentesis on 9/5 with 1.6 L fluid removal.  No sign of peritonitis.  She had symptomatic improvement.  Liver Doppler did show portal venous thrombosis.  Case was discussed with GI on-call Dr. Alessandra Bevels as well as patient's oncologist in [place] Dr. [name].  We were all in agreement and holding off on anticoagulation at this time, due to her high risk for bleeding.  She was hospitalized at [facility] in July 2023 for hemorrhagic shock, variceal bleeding, severe ITP.  Patient needs close follow-up with liver clinic as well as oncology as scheduled.   Discharge Diagnoses:   Principal Problem:   Alcoholic cirrhosis of liver with ascites (HCC) Active Problems:   Ascites   Hypomagnesemia   Alcohol use disorder, severe, dependence (HCC)   Anxiety and depression   Seizure disorder (HCC)   Esophageal varices in  cirrhosis (HCC)   GERD without esophagitis   Portal vein thrombosis   History of ITP   Discharge Instructions  Discharge Instructions     Call MD for:  difficulty breathing, headache or visual disturbances   Complete by: As directed     Call MD for:  extreme fatigue   Complete by: As directed    Call MD for:  persistant dizziness or light-headedness   Complete by: As directed    Call MD for:  persistant nausea and vomiting   Complete by: As directed    Call MD for:  severe uncontrolled pain   Complete by: As directed    Call MD for:  temperature >100.4   Complete by: As directed    Diet - low sodium heart healthy   Complete by: As directed    Discharge instructions   Complete by: As directed    You were cared for by a hospitalist during your hospital stay. If you have any questions about your discharge medications or the care you received while you were in the hospital after you are discharged, you can call the unit and ask to speak with the hospitalist on call if the hospitalist that took care of you is not available. Once you are discharged, your primary care physician will handle any further medical issues. Please note that NO REFILLS for any discharge medications will be authorized once you are discharged, as it is imperative that you return to your primary care physician (or establish a relationship with a primary care physician if you do not have one) for your aftercare needs so that they can reassess your need for medications and monitor your lab values.   Increase activity slowly   Complete by: As directed       Allergies as of 05/15/2022       Reactions   Other Other (See Comments)   Platelets: Rx chest pain, tremors, body aches Patient stated that she receives platelet injection every Wednesday         Medication List     STOP taking these medications    acetaminophen 500 MG tablet Commonly known as: TYLENOL   ibuprofen 800 MG tablet Commonly known as: ADVIL   methocarbamol 500 MG tablet Commonly known as: ROBAXIN       TAKE these medications    acamprosate 333 MG tablet Commonly known as: CAMPRAL Take 2 tablets (666 mg total) by mouth 3 (three) times daily   ARIPiprazole 10 MG  tablet Commonly known as: ABILIFY Take 1 tablet by mouth every day What changed:  how much to take how to take this when to take this   atovaquone 750 MG/5ML suspension Commonly known as: MEPRON Take by mouth.   clonazePAM 0.5 MG tablet Commonly known as: KLONOPIN TAKE 1 TABLET BY MOUTH TWICE DAILY What changed:  how much to take how to take this when to take this reasons to take this   diclofenac Sodium 1 % Gel Commonly known as: VOLTAREN Apply 2 g topically daily. Apply to hands   FLUoxetine 40 MG capsule Commonly known as: PROZAC Take 1 capsule (40 mg total) by mouth daily. What changed: when to take this   furosemide 40 MG tablet Commonly known as: LASIX Take 40 mg by mouth daily.   lactulose 10 GM/15ML solution Commonly known as: CHRONULAC Take 30 mLs (20 g total) by mouth 3 (three) times daily. What changed: Another medication with the same name was removed. Continue taking this medication,  and follow the directions you see here.   levETIRAcetam 750 MG tablet Commonly known as: KEPPRA Take 1 tablet (750 mg total) by mouth 2 (two) times daily.   multivitamin with minerals tablet Take 1 tablet by mouth daily.   nadolol 20 MG tablet Commonly known as: Corgard Take 1 tablet (20 mg total) by mouth every morning. What changed: Another medication with the same name was removed. Continue taking this medication, and follow the directions you see here.   OVER THE COUNTER MEDICATION Take 1 capsule by mouth daily. OLLY Ultra brain soft gel   pantoprazole 40 MG tablet Commonly known as: PROTONIX Take 40 mg by mouth daily.   potassium chloride 20 MEQ packet Commonly known as: KLOR-CON Take 20 mEq by mouth daily. What changed:  how much to take when to take this   romiPLOStim 125 MCG injection Commonly known as: NPLATE Inject into the skin.   spironolactone 50 MG tablet Commonly known as: ALDACTONE Take 100 mg by mouth daily.   thiamine 100 MG  tablet Commonly known as: VITAMIN B1 Take 1 tablet (100 mg total) by mouth daily.        Follow-up Information     [place], [place], [place]. Go on 06/07/2022.   Specialty: Nurse Practitioner Why: [facility] [id number] Contact information: [address] [place] [address] [phone]         Marice Potter, MD. Go on 05/20/2022.   Specialty: Oncology Contact information: [place] [address] [phone]                Allergies  Allergen Reactions   Other Other (See Comments)    Platelets: Rx chest pain, tremors, body aches  Patient stated that she receives platelet injection every Wednesday     Procedures/Studies: US LIVER DOPPLER  Result Date: 05/15/2022 CLINICAL DATA:  Cirrhosis, portal hypertension, ascites and right upper quadrant pain. EXAM: DUPLEX ULTRASOUND OF LIVER TECHNIQUE: Color and duplex Doppler ultrasound was performed to evaluate the hepatic in-flow and out-flow vessels. COMPARISON:  Multiple prior CT studies including a CT of the abdomen and pelvis without contrast on 05/13/2022 FINDINGS: Portal Vein Velocities Main: The main portal vein is occluded. Right: Confident flow not detected. There may be some collateral flow present. There was an area sampled that also includes artifact from likely adjacent sampled hepatic artery. Left: Similar findings in the right lobe of probable collateral flow and/or artifact from adjacent hepatic arterial flow. Hepatic Vein Velocities Right:  113 cm/sec Middle:  39 cm/sec Left:  55 cm/sec Hepatic Artery Velocity:  157 cm/sec Splenic Vein: The splenic vein is patent. Varices: None visualized. Ascites: Ascites visualized adjacent to the liver. The liver demonstrates morphology consistent with advanced cirrhosis. The spleen is enlarged with estimated volume of approximately 530 mL. IMPRESSION: 1. Portal vein thrombosis with possible development of some  mild collateral flow in the liver. 2. Advance cirrhosis of the liver with associated ascites and splenomegaly. Electronically Signed   By: Aletta Edouard M.D.   On: 05/15/2022 08:15   US Paracentesis  Result Date: 05/14/2022 INDICATION: Patient with a history of cirrhosis and recurrent ascites presents today for a therapeutic and diagnostic paracentesis. EXAM: ULTRASOUND GUIDED PARACENTESIS MEDICATIONS: 1% lidocaine 15 mL COMPLICATIONS: None immediate. PROCEDURE: Informed written consent was obtained from the patient after a discussion of the risks, benefits and alternatives to treatment. A timeout was performed prior to the initiation of the  procedure. Initial ultrasound scanning demonstrates a large amount of ascites within the left lower abdominal quadrant. The left lower abdomen was prepped and draped in the usual sterile fashion. 1% lidocaine was used for local anesthesia. Following this, a 19 gauge, 7-cm, Yueh catheter was introduced. An ultrasound image was saved for documentation purposes. The paracentesis was performed. The catheter was removed and a dressing was applied. The patient tolerated the procedure well without immediate post procedural complication. FINDINGS: A total of approximately 1.6 L of clear yellow fluid was removed. Samples were sent to the laboratory as requested by the clinical team. IMPRESSION: Successful ultrasound-guided paracentesis yielding 1.6 liters of peritoneal fluid. Read by: Soyla Dryer, NP PLAN: The patient has previously been formally evaluated by the [facility] and is being actively followed for potential future intervention. ** Patient is now following with [facility] according to recent notes in Care everywhere. Electronically Signed   By: Ruthann Cancer M.D.   On: 05/14/2022 15:34   CT Abdomen Pelvis Wo Contrast  Result Date: 05/13/2022 CLINICAL DATA:  Abdominal pain, acute, nonlocalized EXAM: CT ABDOMEN AND PELVIS  WITHOUT CONTRAST TECHNIQUE: Multidetector CT imaging of the abdomen and pelvis was performed following the standard protocol without IV contrast. RADIATION DOSE REDUCTION: This exam was performed according to the departmental dose-optimization program which includes automated exposure control, adjustment of the mA and/or kV according to patient size and/or use of iterative reconstruction technique. COMPARISON:  03/30/2022 FINDINGS: Lower chest: The visualized lung bases are clear. Cardiac size within normal limits. No pericardial effusion. Numerous gastroesophageal varices are seen surrounding the distal esophagus. Hepatobiliary: The liver is shrunken and the contour is nodular in keeping with changes of advanced cirrhosis. No definite intrahepatic mass identified on this noncontrast examination. Cholelithiasis again noted within a decompressed gallbladder. No intra or extrahepatic biliary ductal dilation. Pancreas: Numerous calcifications are seen within the pancreatic head, stable since prior examination and in keeping with changes of chronic pancreatitis. There is progressive peripancreatic edema involving the body of the pancreas which may represent ascites within the lesser sac or inflammatory changes related to acute pancreatitis. The pancreatic duct is not dilated. Spleen: Normal in size without focal abnormality. Adrenals/Urinary Tract: Adrenal glands are unremarkable. Kidneys are normal, without renal calculi, focal lesion, or hydronephrosis. Bladder is unremarkable. Stomach/Bowel: There is mild circumferential bowel wall thickening and pericolonic inflammatory stranding involving the distal descending colon which appears similar to prior examination and may represent a small residual region of infectious or inflammatory colitis. Previously noted bowel wall thickening involving the ascending and transverse colon has improved in the interval. No evidence of obstruction. The stomach, small bowel, and large  bowel are otherwise unremarkable. Appendix normal. There is moderate ascites present, stable since prior examination. Vascular/Lymphatic: The gonadal veins are dilated bilaterally, not well assessed on this noncontrast examination. The abdominal vasculature is otherwise unremarkable. No aortic aneurysm. No pathologic adenopathy within the abdomen and pelvis. Reproductive: Uterus and bilateral adnexa are unremarkable. Other: No abdominal wall hernia. Musculoskeletal: No acute bone abnormality. No lytic or blastic bone lesion. IMPRESSION: 1. Morphologic changes in keeping with advanced cirrhosis and portal venous hypertension with numerous gastroesophageal varices and moderate ascites. 2. Cholelithiasis. 3. Progressive peripancreatic edema involving the body of the pancreas which may represent ascites within the lesser sac or inflammatory changes related to acute pancreatitis. Correlation with serum amylase and lipase would be helpful for further evaluation. 4. Interval improvement in bowel wall thickening involving the ascending and transverse colon.  Mild residual bowel wall thickening and pericolonic inflammatory stranding involving the distal descending colon may represent a small residual region of infectious or inflammatory colitis. Electronically Signed   By: Helyn Numbers M.D.   On: 05/13/2022 21:18   DG Chest 2 View  Result Date: 05/13/2022 CLINICAL DATA:  Shortness of breath EXAM: CHEST - 2 VIEW COMPARISON:  Previous studies including examination done on 03/11/2022 FINDINGS: Cardiac size is within normal limits. There are calcified nodes in left hilum. There is poor inspiration. There are no signs of pulmonary edema or focal pulmonary consolidation. Postsurgical changes are noted in right shoulder. IMPRESSION: There are no signs of pulmonary edema or focal pulmonary consolidation. Electronically Signed   By: Ernie Avena M.D.   On: 05/13/2022 20:44       Discharge Exam: Vitals:   05/14/22  2118 05/15/22 0527  BP: 115/79 102/60  Pulse:  (!) 110  Resp: 18 16  Temp: 98.4 F (36.9 C) 98.2 F (36.8 C)  SpO2: 95% 97%    General: Pt is alert, awake, not in acute distress Cardiovascular: RRR, S1/S2 +, no edema Respiratory: CTA bilaterally, no wheezing, no rhonchi, no respiratory distress, no conversational dyspnea  Abdominal: Soft, NT, ND, bowel sounds + Extremities: no edema, no cyanosis Psych: Normal mood and affect, stable judgement and insight     The results of significant diagnostics from this hospitalization (including imaging, microbiology, ancillary and laboratory) are listed below for reference.     Microbiology: Recent Results (from the past 240 hour(s))  Gram stain     Status: None (Preliminary result)   Collection Time: 05/14/22  3:06 PM   Specimen: PATH Cytology Peritoneal fluid  Result Value Ref Range Status   Specimen Description   Final    PERITONEAL Performed at [facility], [address] [place], [place] [address]    Special Requests   Final    NONE Performed at [facility], [address], [place], [place] [address]    Gram Stain   Final    NO WBC SEEN NO ORGANISMS SEEN Performed at [facility], [address], [place], [place] [address]    Report Status PENDING  Incomplete     Labs: BNP (last 3 results) Recent Labs    01/22/22 1727 03/10/22 2359 05/13/22 1941  BNP 12.5 16.6 19.0   Basic Metabolic Panel: Recent Labs  Lab 05/13/22 1941 05/14/22 0424 05/15/22 0443  NA 135 135 131*  K 3.8 3.8 3.9  CL 101 103 102  CO2 24 23 21*  GLUCOSE 104* 89 94  BUN 6 8 8   CREATININE 0.63 0.54 0.56  CALCIUM 8.0* 7.8* 7.7*  MG 1.5*  --  1.8  PHOS  --   --  2.5   Liver Function Tests: Recent Labs  Lab 05/13/22 1941 05/14/22 0424 05/15/22 0443  AST 55* 48* 53*  ALT 46* 40 38  ALKPHOS 155* 147* 134*  BILITOT 1.7* 1.6* 2.2*  PROT 6.4* 5.8* 5.6*  ALBUMIN 2.3* 2.1* 2.1*   Recent Labs   Lab 05/13/22 1941  LIPASE 65*   No results for input(s): "AMMONIA" in the last 168 hours. CBC: Recent Labs  Lab 05/13/22 1941 05/14/22 0424 05/15/22 0443  WBC 4.3 3.2* 3.0*  NEUTROABS 1.4*  --   --   HGB 10.3* 8.9* 9.5*  HCT 33.3* 29.4* 32.5*  MCV 79.9* 80.5 83.3  PLT 233 142* 128*   Cardiac Enzymes: No results for input(s): "CKTOTAL", "  CKMB", "CKMBINDEX", "TROPONINI" in the last 168 hours. BNP: Invalid input(s): "POCBNP" CBG: No results for input(s): "GLUCAP" in the last 168 hours. D-Dimer No results for input(s): "DDIMER" in the last 72 hours. Hgb A1c No results for input(s): "HGBA1C" in the last 72 hours. Lipid Profile No results for input(s): "CHOL", "HDL", "LDLCALC", "TRIG", "CHOLHDL", "LDLDIRECT" in the last 72 hours. Thyroid function studies No results for input(s): "TSH", "T4TOTAL", "T3FREE", "THYROIDAB" in the last 72 hours.  Invalid input(s): "FREET3" Anemia work up No results for input(s): "VITAMINB12", "FOLATE", "FERRITIN", "TIBC", "IRON", "RETICCTPCT" in the last 72 hours. Urinalysis    Component Value Date/Time   COLORURINE AMBER (A) 05/13/2022 2143   APPEARANCEUR HAZY (A) 05/13/2022 2143   LABSPEC 1.020 05/13/2022 2143   PHURINE 5.0 05/13/2022 2143   GLUCOSEU NEGATIVE 05/13/2022 2143   HGBUR NEGATIVE 05/13/2022 2143   BILIRUBINUR NEGATIVE 05/13/2022 2143   BILIRUBINUR NEG 05/26/2020 1544   KETONESUR NEGATIVE 05/13/2022 2143   PROTEINUR NEGATIVE 05/13/2022 2143   UROBILINOGEN 1.0 05/26/2020 1544   UROBILINOGEN 0.2 12/02/2017 0836   NITRITE NEGATIVE 05/13/2022 2143   LEUKOCYTESUR NEGATIVE 05/13/2022 2143   Sepsis Labs Recent Labs  Lab 05/13/22 1941 05/14/22 0424 05/15/22 0443  WBC 4.3 3.2* 3.0*   Microbiology Recent Results (from the past 240 hour(s))  Gram stain     Status: None (Preliminary result)   Collection Time: 05/14/22  3:06 PM   Specimen: PATH Cytology Peritoneal fluid  Result Value Ref Range Status   Specimen Description    Final    PERITONEAL Performed at [facility], [place] [address] [place], [place] [address]    Special Requests   Final    NONE Performed at [facility], [place] [address], [place], [place] [address]    Gram Stain   Final    NO WBC SEEN NO ORGANISMS SEEN Performed at [facility], [place] [address] [place], [place] [address]    Report Status PENDING  Incomplete     Patient was seen and examined on the day of discharge and was found to be in stable condition. Time coordinating discharge: 45 minutes including assessment and coordination of care, as well as examination of the patient.   SIGNED:  Dessa Phi, DO [facility] 05/15/2022, 10:56 AM

## 2022-05-16 ENCOUNTER — Other Ambulatory Visit (HOSPITAL_COMMUNITY): Payer: Self-pay

## 2022-05-16 ENCOUNTER — Ambulatory Visit: Payer: Medicaid Other | Admitting: Plastic Surgery

## 2022-05-16 ENCOUNTER — Ambulatory Visit: Payer: Self-pay

## 2022-05-16 ENCOUNTER — Encounter: Payer: Self-pay | Admitting: Oncology

## 2022-05-16 NOTE — Progress Notes (Signed)
Spoke with [facility] and scheduled transportation for 09/11 and 09/13.

## 2022-05-17 ENCOUNTER — Other Ambulatory Visit (HOSPITAL_COMMUNITY): Payer: Self-pay

## 2022-05-17 LAB — PATHOLOGIST SMEAR REVIEW

## 2022-05-17 LAB — GRAM STAIN: Gram Stain: NONE SEEN

## 2022-05-17 MED ORDER — AMOXICILLIN-POT CLAVULANATE 875-125 MG PO TABS
1.0000 | ORAL_TABLET | Freq: Two times a day (BID) | ORAL | 0 refills | Status: DC
Start: 2022-05-17 — End: 2022-07-05
  Filled 2022-05-17: qty 20, 10d supply, fill #0

## 2022-05-19 NOTE — Progress Notes (Signed)
Briana Sweeney  [address] [place],  [place]  [address] [phone]  Clinic Day:  05/20/2022  Referring physician: Marco Collie, MD  HISTORY OF PRESENT ILLNESS:  The patient is a 51 y.o. 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  She has chronically been receiving weekly Nplate to keep her platelets at a suitable level.  The patient comes in today to reassess her thrombocytopenia.  Since her last visit, the patient has months of more problems with respect to her end-stage liver disease.  She has received frequent paracenteses over these past few weeks due to reaccumulating ascites.  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  Based upon her history of severe thrombocytopenia, her heavy alcohol history and the risk of falling, I did not recommend that she be placed on anticoagulation as I felt her bleeding risk for outweighed additional clotting risk.  Furthermore, thrombus from a portal vein would not directly propagate into her pulmonary arterial vasculature to cause a life-threatening pulmonary embolus.  The patient comes in today doing fairly well.  She has begun to develop new ascites, which is becoming uncomfortable.  She requests another paracentesis to be done in the very near future.  As a pertains to her thrombocytopenia, she denies having any severe bleeding/bruising issues.  Of note, the patient says she has not consumed any alcohol in nearly 2 months.    PHYSICAL EXAM:  Blood pressure 118/71, pulse 84, temperature 98.4 F (36.9 C), resp. rate 16, height '4\' 11"'$  (1.499 m), weight 146 lb 12.8 oz (66.6 kg), SpO2 97 %. Wt Readings from Last 3 Encounters:  05/20/22 146 lb 12.8 oz (66.6 kg)  05/13/22 139 lb (63 kg)  05/08/22 136 lb (61.7 kg)   Body mass index is 29.65 kg/m.  Performance status (ECOG): 1 - Symptomatic but completely ambulatory  Physical Exam Vitals and  nursing note reviewed.  Constitutional:      General: She is not in acute distress.    Appearance: Normal appearance.  HENT:     Head: Normocephalic.     Mouth/Throat:     Mouth: Mucous membranes are moist.     Pharynx: Oropharynx is clear. No oropharyngeal exudate or posterior oropharyngeal erythema.  Eyes:     General: No scleral icterus.    Extraocular Movements: Extraocular movements intact.     Conjunctiva/sclera: Conjunctivae normal.     Pupils: Pupils are equal, round, and reactive to light.  Cardiovascular:     Rate and Rhythm: Normal rate and regular rhythm.     Heart sounds: Normal heart sounds. No murmur heard.    No friction rub. No gallop.  Pulmonary:     Effort: Pulmonary effort is normal.     Breath sounds: Normal breath sounds. No wheezing, rhonchi or rales.  Abdominal:     General: There is no distension.     Palpations: Abdomen is soft. There is no hepatomegaly, splenomegaly or mass.     Tenderness: There is no abdominal tenderness.  Musculoskeletal:        General: Normal range of motion.     Cervical back: Normal range of motion and neck supple. No tenderness (N).     Right lower leg: No edema.     Left lower leg: No edema.  Lymphadenopathy:     Cervical: No cervical adenopathy.     Upper Body:     Right upper body: No supraclavicular or axillary  adenopathy.     Left upper body: No supraclavicular or axillary adenopathy.     Lower Body: No right inguinal adenopathy. No left inguinal adenopathy.  Skin:    General: Skin is warm and dry.     Coloration: Skin is not jaundiced.     Findings: No rash.  Neurological:     Mental Status: She is alert and oriented to person, place, and time.     Cranial Nerves: No cranial nerve deficit.  Psychiatric:        Mood and Affect: Mood normal.        Behavior: Behavior normal.        Thought Content: Thought content normal.   LABS:    Iron studies pending  STUDIES:  US LIVER DOPPLER  Result Date:  05/15/2022 CLINICAL DATA:  Cirrhosis, portal hypertension, ascites and right upper quadrant pain. EXAM: DUPLEX ULTRASOUND OF LIVER TECHNIQUE: Color and duplex Doppler ultrasound was performed to evaluate the hepatic in-flow and out-flow vessels. COMPARISON:  Multiple prior CT studies including a CT of the abdomen and pelvis without contrast on 05/13/2022 FINDINGS: Portal Vein Velocities Main: The main portal vein is occluded. Right: Confident flow not detected. There may be some collateral flow present. There was an area sampled that also includes artifact from likely adjacent sampled hepatic artery. Left: Similar findings in the right lobe of probable collateral flow and/or artifact from adjacent hepatic arterial flow. Hepatic Vein Velocities Right:  113 cm/sec Middle:  39 cm/sec Left:  55 cm/sec Hepatic Artery Velocity:  157 cm/sec Splenic Vein: The splenic vein is patent. Varices: None visualized. Ascites: Ascites visualized adjacent to the liver. The liver demonstrates morphology consistent with advanced cirrhosis. The spleen is enlarged with estimated volume of approximately 530 mL. IMPRESSION: 1. 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 2. Advance cirrhosis of the liver with associated ascites and splenomegaly. Electronically Signed   By: Aletta Edouard M.D.   On: 05/15/2022 08:15   US Paracentesis  Result Date: 05/14/2022 INDICATION: Patient with a history of cirrhosis and recurrent ascites presents today for a therapeutic and diagnostic paracentesis. EXAM: ULTRASOUND GUIDED PARACENTESIS MEDICATIONS: 1% lidocaine 15 mL COMPLICATIONS: None immediate. PROCEDURE: Informed written consent was obtained from the patient after a discussion of the risks, benefits and alternatives to treatment. A timeout was performed prior to the initiation of the procedure. Initial ultrasound scanning demonstrates a large amount of ascites within the left lower abdominal  quadrant. The left lower abdomen was prepped and draped in the usual sterile fashion. 1% lidocaine was used for local anesthesia. Following this, a 19 gauge, 7-cm, Yueh catheter was introduced. An ultrasound image was saved for documentation purposes. The paracentesis was performed. The catheter was removed and a dressing was applied. The patient tolerated the procedure well without immediate post procedural complication. FINDINGS: A total of approximately 1.6 L of clear yellow fluid was removed. Samples were sent to the laboratory as requested by the clinical team. IMPRESSION: Successful ultrasound-guided paracentesis yielding 1.6 liters of peritoneal fluid. Read by: Soyla Dryer, NP PLAN: The patient has previously been formally evaluated by the [facility] and is being actively followed for potential future intervention. ** Patient is now following with [facility] according to recent notes in Care everywhere. Electronically Signed   By: Ruthann Cancer M.D.   On: 05/14/2022 15:34   CT Abdomen Pelvis Wo Contrast  Result Date: 05/13/2022 CLINICAL DATA:  Abdominal pain, acute,  nonlocalized EXAM: CT ABDOMEN AND PELVIS WITHOUT CONTRAST TECHNIQUE: Multidetector CT imaging of the abdomen and pelvis was performed following the standard protocol without IV contrast. RADIATION DOSE REDUCTION: This exam was performed according to the departmental dose-optimization program which includes automated exposure control, adjustment of the mA and/or kV according to patient size and/or use of iterative reconstruction technique. COMPARISON:  03/30/2022 FINDINGS: Lower chest: The visualized lung bases are clear. Cardiac size within normal limits. No pericardial effusion. Numerous gastroesophageal varices are seen surrounding the distal esophagus. Hepatobiliary: The liver is shrunken and the contour is nodular in keeping with changes of advanced cirrhosis. No definite intrahepatic mass  identified on this noncontrast examination. Cholelithiasis again noted within a decompressed gallbladder. No intra or extrahepatic biliary ductal dilation. Pancreas: Numerous calcifications are seen within the pancreatic head, stable since prior examination and in keeping with changes of chronic pancreatitis. There is progressive peripancreatic edema involving the body of the pancreas which may represent ascites within the lesser sac or inflammatory changes related to acute pancreatitis. The pancreatic duct is not dilated. Spleen: Normal in size without focal abnormality. Adrenals/Urinary Tract: Adrenal glands are unremarkable. Kidneys are normal, without renal calculi, focal lesion, or hydronephrosis. Bladder is unremarkable. Stomach/Bowel: There is mild circumferential bowel wall thickening and pericolonic inflammatory stranding involving the distal descending colon which appears similar to prior examination and may represent a small residual region of infectious or inflammatory colitis. Previously noted bowel wall thickening involving the ascending and transverse colon has improved in the interval. No evidence of obstruction. The stomach, small bowel, and large bowel are otherwise unremarkable. Appendix normal. There is moderate ascites present, stable since prior examination. Vascular/Lymphatic: The gonadal veins are dilated bilaterally, not well assessed on this noncontrast examination. The abdominal vasculature is otherwise unremarkable. No aortic aneurysm. No pathologic adenopathy within the abdomen and pelvis. Reproductive: Uterus and bilateral adnexa are unremarkable. Other: No abdominal wall hernia. Musculoskeletal: No acute bone abnormality. No lytic or blastic bone lesion. IMPRESSION: 1. Morphologic changes in keeping with advanced cirrhosis and portal venous hypertension with numerous gastroesophageal varices and moderate ascites. 2. Cholelithiasis. 3. Progressive peripancreatic edema involving the body  of the pancreas which may represent ascites within the lesser sac or inflammatory changes related to acute pancreatitis. Correlation with serum amylase and lipase would be helpful for further evaluation. 4. Interval improvement in bowel wall thickening involving the ascending and transverse colon. Mild residual bowel wall thickening and pericolonic inflammatory stranding involving the distal descending colon may represent a small residual region of infectious or inflammatory colitis. Electronically Signed   By: Fidela Salisbury M.D.   On: 05/13/2022 21:18   DG Chest 2 View  Result Date: 05/13/2022 CLINICAL DATA:  Shortness of breath EXAM: CHEST - 2 VIEW COMPARISON:  Previous studies including examination done on 03/11/2022 FINDINGS: Cardiac size is within normal limits. There are calcified nodes in left hilum. There is poor inspiration. There are no signs of pulmonary edema or focal pulmonary consolidation. Postsurgical changes are noted in right shoulder. IMPRESSION: There are no signs of pulmonary edema or focal pulmonary consolidation. Electronically Signed   By: Elmer Picker M.D.   On: 05/13/2022 20:44      ASSESSMENT & PLAN:  Assessment/Plan:  A 51 y.o. 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  I am pleased as her platelet count is at an ideal level today.  Part of this could be explained by the fact that she is no longer drinking alcohol.  As mentioned previously, she continues to receive Nplate injections on a weekly basis, for which she will continue to receive.  Also, as mentioned previously, I do not believe anticoagulation for her portal vein thrombus needs to be considered as I still believe she is a much higher bleeding risk than clotting risk over these next months.  When evaluating her other labs today, she appears to have microcytic anemia again.  Based upon this, I will arrange for her to receive another course of IV iron in the  forthcoming days.  As she also has a fairly significant amount of recurrent ascites, I will also arrange for her to undergo a repeat paracentesis this week as an outpatient.  Moving forward, her CBC will continue to be checked monthly.  I will see her back in 3 months for repeat clinical assessment.  The patient understands all the plans discussed today and is in agreement with them.    Miaya Lafontant Macarthur Critchley, MD

## 2022-05-20 ENCOUNTER — Other Ambulatory Visit: Payer: Self-pay | Admitting: Oncology

## 2022-05-20 ENCOUNTER — Other Ambulatory Visit (HOSPITAL_COMMUNITY): Payer: Self-pay

## 2022-05-20 ENCOUNTER — Inpatient Hospital Stay: Payer: Medicaid Other | Attending: Hematology & Oncology | Admitting: Oncology

## 2022-05-20 ENCOUNTER — Inpatient Hospital Stay: Payer: Medicaid Other

## 2022-05-20 ENCOUNTER — Other Ambulatory Visit: Payer: Self-pay | Admitting: Pharmacist

## 2022-05-20 VITALS — BP 118/71 | HR 84 | Temp 98.4°F | Resp 16 | Ht 59.0 in | Wt 146.8 lb

## 2022-05-20 DIAGNOSIS — K7031 Alcoholic cirrhosis of liver with ascites: Secondary | ICD-10-CM | POA: Insufficient documentation

## 2022-05-20 DIAGNOSIS — D693 Immune thrombocytopenic purpura: Secondary | ICD-10-CM | POA: Diagnosis present

## 2022-05-20 DIAGNOSIS — D508 Other iron deficiency anemias: Secondary | ICD-10-CM

## 2022-05-20 DIAGNOSIS — D696 Thrombocytopenia, unspecified: Secondary | ICD-10-CM

## 2022-05-20 LAB — CBC: RBC: 4.18 (ref 3.87–5.11)

## 2022-05-20 LAB — CBC AND DIFFERENTIAL
HCT: 33 — AB (ref 36–46)
Hemoglobin: 10.1 — AB (ref 12.0–16.0)
Neutrophils Absolute: 4.4
Platelets: 235 10*3/uL (ref 150–400)
WBC: 5.5

## 2022-05-20 LAB — IRON AND TIBC
Iron: 18 ug/dL — ABNORMAL LOW (ref 28–170)
Saturation Ratios: 4 % — ABNORMAL LOW (ref 10.4–31.8)
TIBC: 412 ug/dL (ref 250–450)
UIBC: 394 ug/dL

## 2022-05-20 LAB — FERRITIN: Ferritin: 23 ng/mL (ref 11–307)

## 2022-05-21 ENCOUNTER — Encounter: Payer: Self-pay | Admitting: Oncology

## 2022-05-21 ENCOUNTER — Other Ambulatory Visit (HOSPITAL_COMMUNITY): Payer: Self-pay

## 2022-05-22 ENCOUNTER — Encounter: Payer: Self-pay | Admitting: Oncology

## 2022-05-22 ENCOUNTER — Ambulatory Visit: Payer: Commercial Managed Care - HMO

## 2022-05-22 MED FILL — Ferumoxytol Inj 510 MG/17ML (30 MG/ML) (Elemental Fe): INTRAVENOUS | Qty: 17 | Status: AC

## 2022-05-23 ENCOUNTER — Inpatient Hospital Stay: Payer: Medicaid Other

## 2022-05-23 VITALS — BP 99/71 | HR 85 | Temp 99.2°F | Resp 18 | Ht 59.0 in | Wt 141.8 lb

## 2022-05-23 DIAGNOSIS — D696 Thrombocytopenia, unspecified: Secondary | ICD-10-CM

## 2022-05-23 DIAGNOSIS — D693 Immune thrombocytopenic purpura: Secondary | ICD-10-CM | POA: Diagnosis not present

## 2022-05-23 MED ORDER — SODIUM CHLORIDE 0.9 % IV SOLN
510.0000 mg | Freq: Once | INTRAVENOUS | Status: AC
  Administered 2022-05-23: 510 mg via INTRAVENOUS
  Filled 2022-05-23: qty 510

## 2022-05-23 MED ORDER — ROMIPLOSTIM 250 MCG ~~LOC~~ SOLR
5.8000 ug/kg | Freq: Once | SUBCUTANEOUS | Status: AC
  Administered 2022-05-23: 375 ug via SUBCUTANEOUS
  Filled 2022-05-23: qty 0.5

## 2022-05-23 MED ORDER — SODIUM CHLORIDE 0.9 % IV SOLN: Freq: Once | INTRAVENOUS | Status: AC

## 2022-05-23 NOTE — Progress Notes (Signed)
Spoke with [facility] and scheduled transportation for DOS 09/18 and 09/20.

## 2022-05-23 NOTE — Patient Instructions (Signed)
Romiplostim Injection What is this medication? ROMIPLOSTIM (roe mi PLOE stim) treats low levels of platelets in your body caused by immune thrombocytopenia (ITP). It is prescribed when other medications have not worked or cannot be tolerated. It may also be used to help people who have been exposed to high doses of radiation. It works by increasing the amount of platelets in your blood. This lowers the risk of bleeding. This medicine may be used for other purposes; ask your health care provider or pharmacist if you have questions. COMMON BRAND NAME(S): Nplate What should I tell my care team before I take this medication? They need to know if you have any of these conditions: Blood clots Myelodysplastic syndrome An unusual or allergic reaction to romiplostim, mannitol, other medications, foods, dyes, or preservatives Pregnant or trying to get pregnant Breast-feeding How should I use this medication? This medication is injected under the skin. It is given by a care team in a hospital or clinic setting. A special MedGuide will be given to you before each treatment. Be sure to read this information carefully each time. Talk to your care team about the use of this medication in children. While it may be prescribed for children as young as newborns for selected conditions, precautions do apply. Overdosage: If you think you have taken too much of this medicine contact a poison control center or emergency room at once. NOTE: This medicine is only for you. Do not share this medicine with others. What if I miss a dose? Keep appointments for follow-up doses. It is important not to miss your dose. Call your care team if you are unable to keep an appointment. What may interact with this medication? Interactions are not expected. This list may not describe all possible interactions. Give your health care provider a list of all the medicines, herbs, non-prescription drugs, or dietary supplements you use. Also  tell them if you smoke, drink alcohol, or use illegal drugs. Some items may interact with your medicine. What should I watch for while using this medication? Visit your care team for regular checks on your progress. You may need blood work done while you are taking this medication. Your condition will be monitored carefully while you are receiving this medication. It is important not to miss any appointments. What side effects may I notice from receiving this medication? Side effects that you should report to your care team as soon as possible: Allergic reactions--skin rash, itching, hives, swelling of the face, lips, tongue, or throat Blood clot--pain, swelling, or warmth in the leg, shortness of breath, chest pain Side effects that usually do not require medical attention (report to your care team if they continue or are bothersome): Dizziness Joint pain Muscle pain Pain in the hands or feet Stomach pain Trouble sleeping This list may not describe all possible side effects. Call your doctor for medical advice about side effects. You may report side effects to FDA at 1-800-FDA-1088. Where should I keep my medication? This medication is given in a hospital or clinic. It will not be stored at home. NOTE: This sheet is a summary. It may not cover all possible information. If you have questions about this medicine, talk to your doctor, pharmacist, or health care provider.  2023 Elsevier/Gold Standard (2021-12-04 00:00:00) Ferumoxytol Injection What is this medication? FERUMOXYTOL (FER ue MOX i tol) treats low levels of iron in your body (iron deficiency anemia). Iron is a mineral that plays an important role in making red blood cells, which  carry oxygen from your lungs to the rest of your body. This medicine may be used for other purposes; ask your health care provider or pharmacist if you have questions. COMMON BRAND NAME(S): Feraheme What should I tell my care team before I take this  medication? They need to know if you have any of these conditions: Anemia not caused by low iron levels High levels of iron in the blood Magnetic resonance imaging (MRI) test scheduled An unusual or allergic reaction to iron, other medications, foods, dyes, or preservatives Pregnant or trying to get pregnant Breast-feeding How should I use this medication? This medication is for injection into a vein. It is given in a hospital or clinic setting. Talk to your care team the use of this medication in children. Special care may be needed. Overdosage: If you think you have taken too much of this medicine contact a poison control center or emergency room at once. NOTE: This medicine is only for you. Do not share this medicine with others. What if I miss a dose? It is important not to miss your dose. Call your care team if you are unable to keep an appointment. What may interact with this medication? Other iron products This list may not describe all possible interactions. Give your health care provider a list of all the medicines, herbs, non-prescription drugs, or dietary supplements you use. Also tell them if you smoke, drink alcohol, or use illegal drugs. Some items may interact with your medicine. What should I watch for while using this medication? Visit your care team regularly. Tell your care team if your symptoms do not start to get better or if they get worse. You may need blood work done while you are taking this medication. You may need to follow a special diet. Talk to your care team. Foods that contain iron include: whole grains/cereals, dried fruits, beans, or peas, leafy green vegetables, and organ meats (liver, kidney). What side effects may I notice from receiving this medication? Side effects that you should report to your care team as soon as possible: Allergic reactions--skin rash, itching, hives, swelling of the face, lips, tongue, or throat Low blood pressure--dizziness,  feeling faint or lightheaded, blurry vision Shortness of breath Side effects that usually do not require medical attention (report to your care team if they continue or are bothersome): Flushing Headache Joint pain Muscle pain Nausea Pain, redness, or irritation at injection site This list may not describe all possible side effects. Call your doctor for medical advice about side effects. You may report side effects to FDA at 1-800-FDA-1088. Where should I keep my medication? This medication is given in a hospital or clinic and will not be stored at home. NOTE: This sheet is a summary. It may not cover all possible information. If you have questions about this medicine, talk to your doctor, pharmacist, or health care provider.  2023 Elsevier/Gold Standard (2021-01-19 00:00:00)

## 2022-05-25 ENCOUNTER — Other Ambulatory Visit (HOSPITAL_COMMUNITY): Payer: Self-pay

## 2022-05-27 ENCOUNTER — Inpatient Hospital Stay: Payer: Medicaid Other

## 2022-05-27 ENCOUNTER — Other Ambulatory Visit (HOSPITAL_COMMUNITY): Payer: Self-pay

## 2022-05-27 DIAGNOSIS — D696 Thrombocytopenia, unspecified: Secondary | ICD-10-CM

## 2022-05-27 LAB — CBC AND DIFFERENTIAL
HCT: 31 — AB (ref 36–46)
Hemoglobin: 9.8 — AB (ref 12.0–16.0)
Neutrophils Absolute: 8.16
Platelets: 115 10*3/uL — AB (ref 150–400)
WBC: 9.6

## 2022-05-27 LAB — CBC: RBC: 4.04 (ref 3.87–5.11)

## 2022-05-28 ENCOUNTER — Other Ambulatory Visit: Payer: Self-pay | Admitting: Pharmacist

## 2022-05-28 DIAGNOSIS — D696 Thrombocytopenia, unspecified: Secondary | ICD-10-CM

## 2022-05-29 ENCOUNTER — Inpatient Hospital Stay: Payer: Medicaid Other

## 2022-05-29 VITALS — BP 106/65 | HR 82 | Temp 98.1°F | Resp 20

## 2022-05-29 DIAGNOSIS — D693 Immune thrombocytopenic purpura: Secondary | ICD-10-CM | POA: Diagnosis not present

## 2022-05-29 DIAGNOSIS — D696 Thrombocytopenia, unspecified: Secondary | ICD-10-CM

## 2022-05-29 MED ORDER — ROMIPLOSTIM 125 MCG ~~LOC~~ SOLR
5.8000 ug/kg | Freq: Once | SUBCUTANEOUS | Status: AC
  Administered 2022-05-29: 375 ug via SUBCUTANEOUS
  Filled 2022-05-29: qty 0.25

## 2022-05-29 MED ORDER — SODIUM CHLORIDE 0.9 % IV SOLN
510.0000 mg | Freq: Once | INTRAVENOUS | Status: AC
  Administered 2022-05-29: 510 mg via INTRAVENOUS
  Filled 2022-05-29: qty 510

## 2022-05-29 MED ORDER — SODIUM CHLORIDE 0.9 % IV SOLN: Freq: Once | INTRAVENOUS | Status: AC

## 2022-05-29 NOTE — Patient Instructions (Signed)
Ferumoxytol Injection What is this medication? FERUMOXYTOL (FER ue MOX i tol) treats low levels of iron in your body (iron deficiency anemia). Iron is a mineral that plays an important role in making red blood cells, which carry oxygen from your lungs to the rest of your body. This medicine may be used for other purposes; ask your health care provider or pharmacist if you have questions. COMMON BRAND NAME(S): Feraheme What should I tell my care team before I take this medication? They need to know if you have any of these conditions: Anemia not caused by low iron levels High levels of iron in the blood Magnetic resonance imaging (MRI) test scheduled An unusual or allergic reaction to iron, other medications, foods, dyes, or preservatives Pregnant or trying to get pregnant Breast-feeding How should I use this medication? This medication is for injection into a vein. It is given in a hospital or clinic setting. Talk to your care team the use of this medication in children. Special care may be needed. Overdosage: If you think you have taken too much of this medicine contact a poison control center or emergency room at once. NOTE: This medicine is only for you. Do not share this medicine with others. What if I miss a dose? It is important not to miss your dose. Call your care team if you are unable to keep an appointment. What may interact with this medication? Other iron products This list may not describe all possible interactions. Give your health care provider a list of all the medicines, herbs, non-prescription drugs, or dietary supplements you use. Also tell them if you smoke, drink alcohol, or use illegal drugs. Some items may interact with your medicine. What should I watch for while using this medication? Visit your care team regularly. Tell your care team if your symptoms do not start to get better or if they get worse. You may need blood work done while you are taking this  medication. You may need to follow a special diet. Talk to your care team. Foods that contain iron include: whole grains/cereals, dried fruits, beans, or peas, leafy green vegetables, and organ meats (liver, kidney). What side effects may I notice from receiving this medication? Side effects that you should report to your care team as soon as possible: Allergic reactions--skin rash, itching, hives, swelling of the face, lips, tongue, or throat Low blood pressure--dizziness, feeling faint or lightheaded, blurry vision Shortness of breath Side effects that usually do not require medical attention (report to your care team if they continue or are bothersome): Flushing Headache Joint pain Muscle pain Nausea Pain, redness, or irritation at injection site This list may not describe all possible side effects. Call your doctor for medical advice about side effects. You may report side effects to FDA at 1-800-FDA-1088. Where should I keep my medication? This medication is given in a hospital or clinic and will not be stored at home. NOTE: This sheet is a summary. It may not cover all possible information. If you have questions about this medicine, talk to your doctor, pharmacist, or health care provider.  2023 Elsevier/Gold Standard (2021-01-19 00:00:00) Romiplostim Injection What is this medication? ROMIPLOSTIM (roe mi PLOE stim) treats low levels of platelets in your body caused by immune thrombocytopenia (ITP). It is prescribed when other medications have not worked or cannot be tolerated. It may also be used to help people who have been exposed to high doses of radiation. It works by increasing the amount of platelets in  your blood. This lowers the risk of bleeding. This medicine may be used for other purposes; ask your health care provider or pharmacist if you have questions. COMMON BRAND NAME(S): Nplate What should I tell my care team before I take this medication? They need to know if you  have any of these conditions: Blood clots Myelodysplastic syndrome An unusual or allergic reaction to romiplostim, mannitol, other medications, foods, dyes, or preservatives Pregnant or trying to get pregnant Breast-feeding How should I use this medication? This medication is injected under the skin. It is given by a care team in a hospital or clinic setting. A special MedGuide will be given to you before each treatment. Be sure to read this information carefully each time. Talk to your care team about the use of this medication in children. While it may be prescribed for children as young as newborns for selected conditions, precautions do apply. Overdosage: If you think you have taken too much of this medicine contact a poison control center or emergency room at once. NOTE: This medicine is only for you. Do not share this medicine with others. What if I miss a dose? Keep appointments for follow-up doses. It is important not to miss your dose. Call your care team if you are unable to keep an appointment. What may interact with this medication? Interactions are not expected. This list may not describe all possible interactions. Give your health care provider a list of all the medicines, herbs, non-prescription drugs, or dietary supplements you use. Also tell them if you smoke, drink alcohol, or use illegal drugs. Some items may interact with your medicine. What should I watch for while using this medication? Visit your care team for regular checks on your progress. You may need blood work done while you are taking this medication. Your condition will be monitored carefully while you are receiving this medication. It is important not to miss any appointments. What side effects may I notice from receiving this medication? Side effects that you should report to your care team as soon as possible: Allergic reactions--skin rash, itching, hives, swelling of the face, lips, tongue, or throat Blood  clot--pain, swelling, or warmth in the leg, shortness of breath, chest pain Side effects that usually do not require medical attention (report to your care team if they continue or are bothersome): Dizziness Joint pain Muscle pain Pain in the hands or feet Stomach pain Trouble sleeping This list may not describe all possible side effects. Call your doctor for medical advice about side effects. You may report side effects to FDA at 1-800-FDA-1088. Where should I keep my medication? This medication is given in a hospital or clinic. It will not be stored at home. NOTE: This sheet is a summary. It may not cover all possible information. If you have questions about this medicine, talk to your doctor, pharmacist, or health care provider.  2023 Elsevier/Gold Standard (2021-12-04 00:00:00)

## 2022-05-30 NOTE — Progress Notes (Signed)
Spoke with [facility] and scheduled her next two appointments, 09/25 and 09/27

## 2022-06-03 ENCOUNTER — Telehealth: Payer: Self-pay

## 2022-06-03 ENCOUNTER — Other Ambulatory Visit (HOSPITAL_COMMUNITY): Payer: Self-pay

## 2022-06-03 ENCOUNTER — Inpatient Hospital Stay: Payer: Medicaid Other

## 2022-06-03 DIAGNOSIS — D696 Thrombocytopenia, unspecified: Secondary | ICD-10-CM

## 2022-06-03 LAB — CBC AND DIFFERENTIAL
HCT: 38 (ref 36–46)
Hemoglobin: 11.7 — AB (ref 12.0–16.0)
Neutrophils Absolute: 6.3
Platelets: 115 10*3/uL — AB (ref 150–400)
WBC: 7.5

## 2022-06-03 LAB — CBC: RBC: 4.56 (ref 3.87–5.11)

## 2022-06-03 NOTE — Telephone Encounter (Addendum)
@  63 - Dr [name] reviewed the UA results, states it is fine. No treatment needed. Pt notified and verbalized understanding.   Pt into clinic for labs. She told the phlebotomist that she felt like she a UTI. She wanted to leave a urine sample. Pt denies hematuria, dysuria, frequency and fever. "My back just hurts bad". I discussed above with Dr [name] and he agreed to send the urine for testing. Tuesdee,phlebotomist notified.

## 2022-06-04 LAB — POCT URINALYSIS DIPSTICK
Bilirubin, UA: NEGATIVE
Blood, UA: NEGATIVE
Glucose, UA: NEGATIVE
Ketones, UA: NEGATIVE
Leukocytes, UA: NEGATIVE
Nitrite, UA: NEGATIVE
Specific Gravity: 1.024
pH, UA: 5.5 (ref 5.0–8.0)

## 2022-06-05 ENCOUNTER — Inpatient Hospital Stay: Payer: Medicaid Other

## 2022-06-05 VITALS — BP 108/64 | HR 89 | Temp 98.7°F | Resp 18 | Ht 59.0 in | Wt 145.3 lb

## 2022-06-05 DIAGNOSIS — D693 Immune thrombocytopenic purpura: Secondary | ICD-10-CM | POA: Diagnosis not present

## 2022-06-05 DIAGNOSIS — D696 Thrombocytopenia, unspecified: Secondary | ICD-10-CM

## 2022-06-05 MED ORDER — ROMIPLOSTIM 125 MCG ~~LOC~~ SOLR
5.8500 ug/kg | Freq: Once | SUBCUTANEOUS | Status: AC
  Administered 2022-06-05: 375 ug via SUBCUTANEOUS
  Filled 2022-06-05: qty 0.5

## 2022-06-05 NOTE — Patient Instructions (Signed)
Romiplostim Injection What is this medication? ROMIPLOSTIM (roe mi PLOE stim) treats low levels of platelets in your body caused by immune thrombocytopenia (ITP). It is prescribed when other medications have not worked or cannot be tolerated. It may also be used to help people who have been exposed to high doses of radiation. It works by increasing the amount of platelets in your blood. This lowers the risk of bleeding. This medicine may be used for other purposes; ask your health care provider or pharmacist if you have questions. COMMON BRAND NAME(S): Nplate What should I tell my care team before I take this medication? They need to know if you have any of these conditions: Blood clots Myelodysplastic syndrome An unusual or allergic reaction to romiplostim, mannitol, other medications, foods, dyes, or preservatives Pregnant or trying to get pregnant Breast-feeding How should I use this medication? This medication is injected under the skin. It is given by a care team in a hospital or clinic setting. A special MedGuide will be given to you before each treatment. Be sure to read this information carefully each time. Talk to your care team about the use of this medication in children. While it may be prescribed for children as young as newborns for selected conditions, precautions do apply. Overdosage: If you think you have taken too much of this medicine contact a poison control center or emergency room at once. NOTE: This medicine is only for you. Do not share this medicine with others. What if I miss a dose? Keep appointments for follow-up doses. It is important not to miss your dose. Call your care team if you are unable to keep an appointment. What may interact with this medication? Interactions are not expected. This list may not describe all possible interactions. Give your health care provider a list of all the medicines, herbs, non-prescription drugs, or dietary supplements you use. Also  tell them if you smoke, drink alcohol, or use illegal drugs. Some items may interact with your medicine. What should I watch for while using this medication? Visit your care team for regular checks on your progress. You may need blood work done while you are taking this medication. Your condition will be monitored carefully while you are receiving this medication. It is important not to miss any appointments. What side effects may I notice from receiving this medication? Side effects that you should report to your care team as soon as possible: Allergic reactions--skin rash, itching, hives, swelling of the face, lips, tongue, or throat Blood clot--pain, swelling, or warmth in the leg, shortness of breath, chest pain Side effects that usually do not require medical attention (report to your care team if they continue or are bothersome): Dizziness Joint pain Muscle pain Pain in the hands or feet Stomach pain Trouble sleeping This list may not describe all possible side effects. Call your doctor for medical advice about side effects. You may report side effects to FDA at 1-800-FDA-1088. Where should I keep my medication? This medication is given in a hospital or clinic. It will not be stored at home. NOTE: This sheet is a summary. It may not cover all possible information. If you have questions about this medicine, talk to your doctor, pharmacist, or health care provider.  2023 Elsevier/Gold Standard (2021-12-04 00:00:00)  

## 2022-06-06 NOTE — Progress Notes (Signed)
Sent in request for DOS 06/10/2022 and 06/12/2022 to [employer].

## 2022-06-07 ENCOUNTER — Other Ambulatory Visit (HOSPITAL_COMMUNITY): Payer: Self-pay

## 2022-06-07 MED ORDER — CEPHALEXIN 500 MG PO CAPS
500.0000 mg | ORAL_CAPSULE | Freq: Four times a day (QID) | ORAL | 0 refills | Status: AC
  Filled 2022-06-07: qty 28, 7d supply, fill #0

## 2022-06-10 ENCOUNTER — Inpatient Hospital Stay: Payer: No Typology Code available for payment source

## 2022-06-11 ENCOUNTER — Inpatient Hospital Stay: Payer: No Typology Code available for payment source | Attending: Hematology & Oncology

## 2022-06-11 DIAGNOSIS — D693 Immune thrombocytopenic purpura: Secondary | ICD-10-CM | POA: Insufficient documentation

## 2022-06-11 DIAGNOSIS — D696 Thrombocytopenia, unspecified: Secondary | ICD-10-CM

## 2022-06-11 LAB — CBC AND DIFFERENTIAL
HCT: 39 (ref 36–46)
Hemoglobin: 12.3 (ref 12.0–16.0)
Neutrophils Absolute: 4.1
Platelets: 220 10*3/uL (ref 150–400)
WBC: 5.4

## 2022-06-11 LAB — CBC: RBC: 4.62 (ref 3.87–5.11)

## 2022-06-12 ENCOUNTER — Inpatient Hospital Stay: Payer: No Typology Code available for payment source

## 2022-06-12 VITALS — BP 116/77 | HR 89 | Temp 98.3°F | Resp 20 | Ht 59.0 in | Wt 143.2 lb

## 2022-06-12 DIAGNOSIS — D693 Immune thrombocytopenic purpura: Secondary | ICD-10-CM | POA: Diagnosis present

## 2022-06-12 DIAGNOSIS — D696 Thrombocytopenia, unspecified: Secondary | ICD-10-CM

## 2022-06-12 MED ORDER — ROMIPLOSTIM 250 MCG ~~LOC~~ SOLR
5.7000 ug/kg | Freq: Once | SUBCUTANEOUS | Status: AC
  Administered 2022-06-12: 375 ug via SUBCUTANEOUS
  Filled 2022-06-12: qty 0.25

## 2022-06-12 NOTE — Patient Instructions (Signed)
Romiplostim Injection What is this medication? ROMIPLOSTIM (roe mi PLOE stim) treats low levels of platelets in your body caused by immune thrombocytopenia (ITP). It is prescribed when other medications have not worked or cannot be tolerated. It may also be used to help people who have been exposed to high doses of radiation. It works by increasing the amount of platelets in your blood. This lowers the risk of bleeding. This medicine may be used for other purposes; ask your health care provider or pharmacist if you have questions. COMMON BRAND NAME(S): Nplate What should I tell my care team before I take this medication? They need to know if you have any of these conditions: Blood clots Myelodysplastic syndrome An unusual or allergic reaction to romiplostim, mannitol, other medications, foods, dyes, or preservatives Pregnant or trying to get pregnant Breast-feeding How should I use this medication? This medication is injected under the skin. It is given by a care team in a hospital or clinic setting. A special MedGuide will be given to you before each treatment. Be sure to read this information carefully each time. Talk to your care team about the use of this medication in children. While it may be prescribed for children as young as newborns for selected conditions, precautions do apply. Overdosage: If you think you have taken too much of this medicine contact a poison control center or emergency room at once. NOTE: This medicine is only for you. Do not share this medicine with others. What if I miss a dose? Keep appointments for follow-up doses. It is important not to miss your dose. Call your care team if you are unable to keep an appointment. What may interact with this medication? Interactions are not expected. This list may not describe all possible interactions. Give your health care provider a list of all the medicines, herbs, non-prescription drugs, or dietary supplements you use. Also  tell them if you smoke, drink alcohol, or use illegal drugs. Some items may interact with your medicine. What should I watch for while using this medication? Visit your care team for regular checks on your progress. You may need blood work done while you are taking this medication. Your condition will be monitored carefully while you are receiving this medication. It is important not to miss any appointments. What side effects may I notice from receiving this medication? Side effects that you should report to your care team as soon as possible: Allergic reactions--skin rash, itching, hives, swelling of the face, lips, tongue, or throat Blood clot--pain, swelling, or warmth in the leg, shortness of breath, chest pain Side effects that usually do not require medical attention (report to your care team if they continue or are bothersome): Dizziness Joint pain Muscle pain Pain in the hands or feet Stomach pain Trouble sleeping This list may not describe all possible side effects. Call your doctor for medical advice about side effects. You may report side effects to FDA at 1-800-FDA-1088. Where should I keep my medication? This medication is given in a hospital or clinic. It will not be stored at home. NOTE: This sheet is a summary. It may not cover all possible information. If you have questions about this medicine, talk to your doctor, pharmacist, or health care provider.  2023 Elsevier/Gold Standard (2021-12-04 00:00:00)  

## 2022-06-12 NOTE — Progress Notes (Signed)
Spoke with [facility] and scheduled transportation for DOS 10/09 and 10/11.

## 2022-06-17 ENCOUNTER — Inpatient Hospital Stay: Payer: No Typology Code available for payment source

## 2022-06-17 DIAGNOSIS — D693 Immune thrombocytopenic purpura: Secondary | ICD-10-CM

## 2022-06-17 LAB — CBC AND DIFFERENTIAL
HCT: 44 (ref 36–46)
Hemoglobin: 13.9 (ref 12.0–16.0)
Neutrophils Absolute: 10.19
Platelets: 214 10*3/uL (ref 150–400)
WBC: 10.5

## 2022-06-17 LAB — CBC: RBC: 5.02 (ref 3.87–5.11)

## 2022-06-19 ENCOUNTER — Inpatient Hospital Stay: Payer: No Typology Code available for payment source

## 2022-06-19 VITALS — BP 122/84 | HR 90 | Temp 98.1°F | Resp 20 | Wt 144.0 lb

## 2022-06-19 DIAGNOSIS — D696 Thrombocytopenia, unspecified: Secondary | ICD-10-CM

## 2022-06-19 DIAGNOSIS — D693 Immune thrombocytopenic purpura: Secondary | ICD-10-CM | POA: Diagnosis not present

## 2022-06-19 MED ORDER — ROMIPLOSTIM 125 MCG ~~LOC~~ SOLR
5.7500 ug/kg | Freq: Once | SUBCUTANEOUS | Status: AC
  Administered 2022-06-19: 375 ug via SUBCUTANEOUS
  Filled 2022-06-19: qty 0.5

## 2022-06-19 NOTE — Progress Notes (Signed)
Spoke with [facility] and scheduled transportation for 06/24/2022 and 06/26/2022 for patient.

## 2022-06-19 NOTE — Patient Instructions (Signed)
Romiplostim Injection What is this medication? ROMIPLOSTIM (roe mi PLOE stim) treats low levels of platelets in your body caused by immune thrombocytopenia (ITP). It is prescribed when other medications have not worked or cannot be tolerated. It may also be used to help people who have been exposed to high doses of radiation. It works by increasing the amount of platelets in your blood. This lowers the risk of bleeding. This medicine may be used for other purposes; ask your health care provider or pharmacist if you have questions. COMMON BRAND NAME(S): Nplate What should I tell my care team before I take this medication? They need to know if you have any of these conditions: Blood clots Myelodysplastic syndrome An unusual or allergic reaction to romiplostim, mannitol, other medications, foods, dyes, or preservatives Pregnant or trying to get pregnant Breast-feeding How should I use this medication? This medication is injected under the skin. It is given by a care team in a hospital or clinic setting. A special MedGuide will be given to you before each treatment. Be sure to read this information carefully each time. Talk to your care team about the use of this medication in children. While it may be prescribed for children as young as newborns for selected conditions, precautions do apply. Overdosage: If you think you have taken too much of this medicine contact a poison control center or emergency room at once. NOTE: This medicine is only for you. Do not share this medicine with others. What if I miss a dose? Keep appointments for follow-up doses. It is important not to miss your dose. Call your care team if you are unable to keep an appointment. What may interact with this medication? Interactions are not expected. This list may not describe all possible interactions. Give your health care provider a list of all the medicines, herbs, non-prescription drugs, or dietary supplements you use. Also  tell them if you smoke, drink alcohol, or use illegal drugs. Some items may interact with your medicine. What should I watch for while using this medication? Visit your care team for regular checks on your progress. You may need blood work done while you are taking this medication. Your condition will be monitored carefully while you are receiving this medication. It is important not to miss any appointments. What side effects may I notice from receiving this medication? Side effects that you should report to your care team as soon as possible: Allergic reactions--skin rash, itching, hives, swelling of the face, lips, tongue, or throat Blood clot--pain, swelling, or warmth in the leg, shortness of breath, chest pain Side effects that usually do not require medical attention (report to your care team if they continue or are bothersome): Dizziness Joint pain Muscle pain Pain in the hands or feet Stomach pain Trouble sleeping This list may not describe all possible side effects. Call your doctor for medical advice about side effects. You may report side effects to FDA at 1-800-FDA-1088. Where should I keep my medication? This medication is given in a hospital or clinic. It will not be stored at home. NOTE: This sheet is a summary. It may not cover all possible information. If you have questions about this medicine, talk to your doctor, pharmacist, or health care provider.  2023 Elsevier/Gold Standard (2021-12-04 00:00:00)  

## 2022-06-21 ENCOUNTER — Other Ambulatory Visit (HOSPITAL_COMMUNITY): Payer: Self-pay

## 2022-06-22 ENCOUNTER — Other Ambulatory Visit (HOSPITAL_COMMUNITY): Payer: Self-pay

## 2022-06-24 ENCOUNTER — Other Ambulatory Visit: Payer: Medicaid Other

## 2022-06-24 ENCOUNTER — Inpatient Hospital Stay: Payer: No Typology Code available for payment source

## 2022-06-24 ENCOUNTER — Other Ambulatory Visit (HOSPITAL_COMMUNITY): Payer: Self-pay

## 2022-06-25 ENCOUNTER — Other Ambulatory Visit (HOSPITAL_COMMUNITY): Payer: Self-pay

## 2022-06-25 NOTE — Progress Notes (Signed)
Spoke with [facility] and scheduled transportation for DOS 07/03/2022 and 07/04/2022.

## 2022-06-26 ENCOUNTER — Ambulatory Visit: Payer: Commercial Managed Care - HMO

## 2022-06-26 ENCOUNTER — Inpatient Hospital Stay: Payer: No Typology Code available for payment source

## 2022-06-26 ENCOUNTER — Other Ambulatory Visit: Payer: Self-pay | Admitting: Pharmacist

## 2022-06-26 DIAGNOSIS — D693 Immune thrombocytopenic purpura: Secondary | ICD-10-CM

## 2022-06-26 LAB — CBC: RBC: 4.96 (ref 3.87–5.11)

## 2022-06-26 LAB — CBC AND DIFFERENTIAL
HCT: 44 (ref 36–46)
Hemoglobin: 14.1 (ref 12.0–16.0)
Neutrophils Absolute: 15.74
Platelets: 207 10*3/uL (ref 150–400)
WBC: 17.3

## 2022-06-27 ENCOUNTER — Inpatient Hospital Stay: Payer: No Typology Code available for payment source

## 2022-06-27 ENCOUNTER — Other Ambulatory Visit (HOSPITAL_COMMUNITY): Payer: Self-pay

## 2022-06-27 VITALS — BP 107/83 | HR 86 | Temp 98.7°F | Resp 18 | Ht 59.0 in | Wt 144.0 lb

## 2022-06-27 DIAGNOSIS — F419 Anxiety disorder, unspecified: Secondary | ICD-10-CM | POA: Diagnosis present

## 2022-06-27 DIAGNOSIS — M659 Synovitis and tenosynovitis, unspecified: Secondary | ICD-10-CM | POA: Diagnosis present

## 2022-06-27 DIAGNOSIS — D849 Immunodeficiency, unspecified: Secondary | ICD-10-CM | POA: Diagnosis present

## 2022-06-27 DIAGNOSIS — E861 Hypovolemia: Secondary | ICD-10-CM | POA: Diagnosis present

## 2022-06-27 DIAGNOSIS — A4102 Sepsis due to Methicillin resistant Staphylococcus aureus: Secondary | ICD-10-CM | POA: Diagnosis not present

## 2022-06-27 DIAGNOSIS — M609 Myositis, unspecified: Secondary | ICD-10-CM | POA: Diagnosis present

## 2022-06-27 DIAGNOSIS — L02611 Cutaneous abscess of right foot: Secondary | ICD-10-CM | POA: Diagnosis present

## 2022-06-27 DIAGNOSIS — F3189 Other bipolar disorder: Secondary | ICD-10-CM | POA: Diagnosis present

## 2022-06-27 DIAGNOSIS — D693 Immune thrombocytopenic purpura: Secondary | ICD-10-CM | POA: Diagnosis not present

## 2022-06-27 DIAGNOSIS — L03115 Cellulitis of right lower limb: Secondary | ICD-10-CM | POA: Diagnosis not present

## 2022-06-27 DIAGNOSIS — I8510 Secondary esophageal varices without bleeding: Secondary | ICD-10-CM | POA: Diagnosis present

## 2022-06-27 DIAGNOSIS — Z801 Family history of malignant neoplasm of trachea, bronchus and lung: Secondary | ICD-10-CM

## 2022-06-27 DIAGNOSIS — Z8619 Personal history of other infectious and parasitic diseases: Secondary | ICD-10-CM

## 2022-06-27 DIAGNOSIS — Z7952 Long term (current) use of systemic steroids: Secondary | ICD-10-CM

## 2022-06-27 DIAGNOSIS — R6520 Severe sepsis without septic shock: Secondary | ICD-10-CM | POA: Diagnosis present

## 2022-06-27 DIAGNOSIS — E860 Dehydration: Secondary | ICD-10-CM | POA: Diagnosis present

## 2022-06-27 DIAGNOSIS — K219 Gastro-esophageal reflux disease without esophagitis: Secondary | ICD-10-CM | POA: Diagnosis present

## 2022-06-27 DIAGNOSIS — K766 Portal hypertension: Secondary | ICD-10-CM | POA: Diagnosis present

## 2022-06-27 DIAGNOSIS — Z79899 Other long term (current) drug therapy: Secondary | ICD-10-CM

## 2022-06-27 DIAGNOSIS — D696 Thrombocytopenia, unspecified: Secondary | ICD-10-CM

## 2022-06-27 DIAGNOSIS — Z800 Family history of malignant neoplasm of digestive organs: Secondary | ICD-10-CM

## 2022-06-27 DIAGNOSIS — M869 Osteomyelitis, unspecified: Secondary | ICD-10-CM | POA: Diagnosis present

## 2022-06-27 DIAGNOSIS — K7030 Alcoholic cirrhosis of liver without ascites: Secondary | ICD-10-CM | POA: Diagnosis present

## 2022-06-27 DIAGNOSIS — N179 Acute kidney failure, unspecified: Secondary | ICD-10-CM | POA: Diagnosis present

## 2022-06-27 DIAGNOSIS — E43 Unspecified severe protein-calorie malnutrition: Secondary | ICD-10-CM | POA: Diagnosis present

## 2022-06-27 DIAGNOSIS — N1831 Chronic kidney disease, stage 3a: Secondary | ICD-10-CM | POA: Diagnosis present

## 2022-06-27 DIAGNOSIS — G40909 Epilepsy, unspecified, not intractable, without status epilepticus: Secondary | ICD-10-CM | POA: Diagnosis present

## 2022-06-27 DIAGNOSIS — Z811 Family history of alcohol abuse and dependence: Secondary | ICD-10-CM

## 2022-06-27 DIAGNOSIS — Z6829 Body mass index (BMI) 29.0-29.9, adult: Secondary | ICD-10-CM

## 2022-06-27 MED ORDER — ROMIPLOSTIM 125 MCG ~~LOC~~ SOLR
5.7500 ug/kg | Freq: Once | SUBCUTANEOUS | Status: AC
  Administered 2022-06-27: 375 ug via SUBCUTANEOUS
  Filled 2022-06-27: qty 0.5

## 2022-06-27 NOTE — Patient Instructions (Signed)
Romiplostim Injection What is this medication? ROMIPLOSTIM (roe mi PLOE stim) treats low levels of platelets in your body caused by immune thrombocytopenia (ITP). It is prescribed when other medications have not worked or cannot be tolerated. It may also be used to help people who have been exposed to high doses of radiation. It works by increasing the amount of platelets in your blood. This lowers the risk of bleeding. This medicine may be used for other purposes; ask your health care provider or pharmacist if you have questions. COMMON BRAND NAME(S): Nplate What should I tell my care team before I take this medication? They need to know if you have any of these conditions: Blood clots Myelodysplastic syndrome An unusual or allergic reaction to romiplostim, mannitol, other medications, foods, dyes, or preservatives Pregnant or trying to get pregnant Breast-feeding How should I use this medication? This medication is injected under the skin. It is given by a care team in a hospital or clinic setting. A special MedGuide will be given to you before each treatment. Be sure to read this information carefully each time. Talk to your care team about the use of this medication in children. While it may be prescribed for children as young as newborns for selected conditions, precautions do apply. Overdosage: If you think you have taken too much of this medicine contact a poison control center or emergency room at once. NOTE: This medicine is only for you. Do not share this medicine with others. What if I miss a dose? Keep appointments for follow-up doses. It is important not to miss your dose. Call your care team if you are unable to keep an appointment. What may interact with this medication? Interactions are not expected. This list may not describe all possible interactions. Give your health care provider a list of all the medicines, herbs, non-prescription drugs, or dietary supplements you use. Also  tell them if you smoke, drink alcohol, or use illegal drugs. Some items may interact with your medicine. What should I watch for while using this medication? Visit your care team for regular checks on your progress. You may need blood work done while you are taking this medication. Your condition will be monitored carefully while you are receiving this medication. It is important not to miss any appointments. What side effects may I notice from receiving this medication? Side effects that you should report to your care team as soon as possible: Allergic reactions--skin rash, itching, hives, swelling of the face, lips, tongue, or throat Blood clot--pain, swelling, or warmth in the leg, shortness of breath, chest pain Side effects that usually do not require medical attention (report to your care team if they continue or are bothersome): Dizziness Joint pain Muscle pain Pain in the hands or feet Stomach pain Trouble sleeping This list may not describe all possible side effects. Call your doctor for medical advice about side effects. You may report side effects to FDA at 1-800-FDA-1088. Where should I keep my medication? This medication is given in a hospital or clinic. It will not be stored at home. NOTE: This sheet is a summary. It may not cover all possible information. If you have questions about this medicine, talk to your doctor, pharmacist, or health care provider.  2023 Elsevier/Gold Standard (2021-12-04 00:00:00)  

## 2022-06-29 ENCOUNTER — Emergency Department (HOSPITAL_COMMUNITY): Payer: No Typology Code available for payment source

## 2022-06-29 ENCOUNTER — Inpatient Hospital Stay (HOSPITAL_COMMUNITY)
Admission: EM | Admit: 2022-06-29 | Discharge: 2022-07-05 | DRG: 853 | Disposition: A | Discharge status: Rehabilitation Facility | Payer: No Typology Code available for payment source | Attending: Student in an Organized Health Care Education/Training Program | Admitting: Student in an Organized Health Care Education/Training Program

## 2022-06-29 ENCOUNTER — Encounter (HOSPITAL_COMMUNITY): Payer: Self-pay | Admitting: Emergency Medicine

## 2022-06-29 ENCOUNTER — Inpatient Hospital Stay (HOSPITAL_COMMUNITY): Payer: No Typology Code available for payment source

## 2022-06-29 ENCOUNTER — Other Ambulatory Visit: Payer: Self-pay

## 2022-06-29 DIAGNOSIS — R7881 Bacteremia: Secondary | ICD-10-CM | POA: Diagnosis present

## 2022-06-29 DIAGNOSIS — B1920 Unspecified viral hepatitis C without hepatic coma: Secondary | ICD-10-CM | POA: Diagnosis present

## 2022-06-29 DIAGNOSIS — M86471 Chronic osteomyelitis with draining sinus, right ankle and foot: Secondary | ICD-10-CM | POA: Diagnosis not present

## 2022-06-29 DIAGNOSIS — R52 Pain, unspecified: Secondary | ICD-10-CM

## 2022-06-29 DIAGNOSIS — Z79899 Other long term (current) drug therapy: Secondary | ICD-10-CM | POA: Diagnosis not present

## 2022-06-29 DIAGNOSIS — K7460 Unspecified cirrhosis of liver: Secondary | ICD-10-CM | POA: Diagnosis present

## 2022-06-29 DIAGNOSIS — R6520 Severe sepsis without septic shock: Secondary | ICD-10-CM | POA: Diagnosis present

## 2022-06-29 DIAGNOSIS — G40909 Epilepsy, unspecified, not intractable, without status epilepticus: Secondary | ICD-10-CM | POA: Diagnosis present

## 2022-06-29 DIAGNOSIS — E861 Hypovolemia: Secondary | ICD-10-CM | POA: Diagnosis present

## 2022-06-29 DIAGNOSIS — N1831 Chronic kidney disease, stage 3a: Secondary | ICD-10-CM | POA: Diagnosis present

## 2022-06-29 DIAGNOSIS — D693 Immune thrombocytopenic purpura: Secondary | ICD-10-CM | POA: Diagnosis present

## 2022-06-29 DIAGNOSIS — N179 Acute kidney failure, unspecified: Secondary | ICD-10-CM

## 2022-06-29 DIAGNOSIS — F419 Anxiety disorder, unspecified: Secondary | ICD-10-CM | POA: Diagnosis present

## 2022-06-29 DIAGNOSIS — E43 Unspecified severe protein-calorie malnutrition: Secondary | ICD-10-CM | POA: Diagnosis present

## 2022-06-29 DIAGNOSIS — M86671 Other chronic osteomyelitis, right ankle and foot: Secondary | ICD-10-CM | POA: Diagnosis not present

## 2022-06-29 DIAGNOSIS — B9562 Methicillin resistant Staphylococcus aureus infection as the cause of diseases classified elsewhere: Secondary | ICD-10-CM

## 2022-06-29 DIAGNOSIS — F3189 Other bipolar disorder: Secondary | ICD-10-CM | POA: Diagnosis present

## 2022-06-29 DIAGNOSIS — Z6831 Body mass index (BMI) 31.0-31.9, adult: Secondary | ICD-10-CM | POA: Diagnosis not present

## 2022-06-29 DIAGNOSIS — G8918 Other acute postprocedural pain: Secondary | ICD-10-CM | POA: Diagnosis not present

## 2022-06-29 DIAGNOSIS — K766 Portal hypertension: Secondary | ICD-10-CM | POA: Diagnosis present

## 2022-06-29 DIAGNOSIS — K7030 Alcoholic cirrhosis of liver without ascites: Secondary | ICD-10-CM | POA: Diagnosis present

## 2022-06-29 DIAGNOSIS — Z6829 Body mass index (BMI) 29.0-29.9, adult: Secondary | ICD-10-CM

## 2022-06-29 DIAGNOSIS — N183 Chronic kidney disease, stage 3 unspecified: Secondary | ICD-10-CM | POA: Diagnosis present

## 2022-06-29 DIAGNOSIS — M609 Myositis, unspecified: Secondary | ICD-10-CM | POA: Diagnosis present

## 2022-06-29 DIAGNOSIS — E860 Dehydration: Secondary | ICD-10-CM | POA: Diagnosis present

## 2022-06-29 DIAGNOSIS — D849 Immunodeficiency, unspecified: Secondary | ICD-10-CM | POA: Diagnosis present

## 2022-06-29 DIAGNOSIS — D72829 Elevated white blood cell count, unspecified: Secondary | ICD-10-CM

## 2022-06-29 DIAGNOSIS — R7989 Other specified abnormal findings of blood chemistry: Secondary | ICD-10-CM | POA: Diagnosis present

## 2022-06-29 DIAGNOSIS — K219 Gastro-esophageal reflux disease without esophagitis: Secondary | ICD-10-CM | POA: Diagnosis present

## 2022-06-29 DIAGNOSIS — F413 Other mixed anxiety disorders: Secondary | ICD-10-CM | POA: Diagnosis present

## 2022-06-29 DIAGNOSIS — L03115 Cellulitis of right lower limb: Secondary | ICD-10-CM | POA: Diagnosis present

## 2022-06-29 DIAGNOSIS — L89159 Pressure ulcer of sacral region, unspecified stage: Secondary | ICD-10-CM | POA: Diagnosis present

## 2022-06-29 DIAGNOSIS — Z7952 Long term (current) use of systemic steroids: Secondary | ICD-10-CM

## 2022-06-29 DIAGNOSIS — A4102 Sepsis due to Methicillin resistant Staphylococcus aureus: Secondary | ICD-10-CM | POA: Diagnosis present

## 2022-06-29 DIAGNOSIS — R609 Edema, unspecified: Secondary | ICD-10-CM | POA: Diagnosis not present

## 2022-06-29 DIAGNOSIS — L02611 Cutaneous abscess of right foot: Secondary | ICD-10-CM | POA: Diagnosis present

## 2022-06-29 DIAGNOSIS — Z23 Encounter for immunization: Secondary | ICD-10-CM | POA: Diagnosis not present

## 2022-06-29 DIAGNOSIS — K7031 Alcoholic cirrhosis of liver with ascites: Secondary | ICD-10-CM | POA: Diagnosis present

## 2022-06-29 DIAGNOSIS — L538 Other specified erythematous conditions: Secondary | ICD-10-CM

## 2022-06-29 DIAGNOSIS — B182 Chronic viral hepatitis C: Secondary | ICD-10-CM | POA: Diagnosis not present

## 2022-06-29 DIAGNOSIS — G546 Phantom limb syndrome with pain: Secondary | ICD-10-CM | POA: Diagnosis present

## 2022-06-29 DIAGNOSIS — R5381 Other malaise: Secondary | ICD-10-CM | POA: Diagnosis present

## 2022-06-29 DIAGNOSIS — F319 Bipolar disorder, unspecified: Secondary | ICD-10-CM | POA: Diagnosis present

## 2022-06-29 DIAGNOSIS — F418 Other specified anxiety disorders: Secondary | ICD-10-CM | POA: Diagnosis not present

## 2022-06-29 DIAGNOSIS — I8510 Secondary esophageal varices without bleeding: Secondary | ICD-10-CM | POA: Diagnosis present

## 2022-06-29 DIAGNOSIS — M869 Osteomyelitis, unspecified: Secondary | ICD-10-CM | POA: Diagnosis present

## 2022-06-29 DIAGNOSIS — I739 Peripheral vascular disease, unspecified: Secondary | ICD-10-CM | POA: Diagnosis not present

## 2022-06-29 DIAGNOSIS — M86271 Subacute osteomyelitis, right ankle and foot: Secondary | ICD-10-CM | POA: Diagnosis not present

## 2022-06-29 DIAGNOSIS — A419 Sepsis, unspecified organism: Secondary | ICD-10-CM

## 2022-06-29 DIAGNOSIS — M659 Synovitis and tenosynovitis, unspecified: Secondary | ICD-10-CM | POA: Diagnosis present

## 2022-06-29 DIAGNOSIS — Z8719 Personal history of other diseases of the digestive system: Secondary | ICD-10-CM

## 2022-06-29 DIAGNOSIS — Z800 Family history of malignant neoplasm of digestive organs: Secondary | ICD-10-CM

## 2022-06-29 DIAGNOSIS — M60073 Infective myositis, right foot: Secondary | ICD-10-CM | POA: Diagnosis not present

## 2022-06-29 DIAGNOSIS — R2689 Other abnormalities of gait and mobility: Secondary | ICD-10-CM | POA: Diagnosis present

## 2022-06-29 DIAGNOSIS — Z8619 Personal history of other infectious and parasitic diseases: Secondary | ICD-10-CM | POA: Diagnosis not present

## 2022-06-29 DIAGNOSIS — Z801 Family history of malignant neoplasm of trachea, bronchus and lung: Secondary | ICD-10-CM

## 2022-06-29 DIAGNOSIS — Z89511 Acquired absence of right leg below knee: Secondary | ICD-10-CM | POA: Diagnosis not present

## 2022-06-29 DIAGNOSIS — M86071 Acute hematogenous osteomyelitis, right ankle and foot: Secondary | ICD-10-CM | POA: Diagnosis not present

## 2022-06-29 DIAGNOSIS — Z9151 Personal history of suicidal behavior: Secondary | ICD-10-CM | POA: Diagnosis not present

## 2022-06-29 DIAGNOSIS — M7989 Other specified soft tissue disorders: Secondary | ICD-10-CM | POA: Diagnosis not present

## 2022-06-29 DIAGNOSIS — R17 Unspecified jaundice: Secondary | ICD-10-CM | POA: Diagnosis not present

## 2022-06-29 DIAGNOSIS — S88111A Complete traumatic amputation at level between knee and ankle, right lower leg, initial encounter: Secondary | ICD-10-CM

## 2022-06-29 DIAGNOSIS — E876 Hypokalemia: Secondary | ICD-10-CM | POA: Diagnosis not present

## 2022-06-29 DIAGNOSIS — Z811 Family history of alcohol abuse and dependence: Secondary | ICD-10-CM

## 2022-06-29 LAB — COMPREHENSIVE METABOLIC PANEL
ALT: 25 U/L (ref 0–44)
AST: 34 U/L (ref 15–41)
Albumin: 2.2 g/dL — ABNORMAL LOW (ref 3.5–5.0)
Alkaline Phosphatase: 200 U/L — ABNORMAL HIGH (ref 38–126)
Anion gap: 12 (ref 5–15)
BUN: 30 mg/dL — ABNORMAL HIGH (ref 6–20)
CO2: 18 mmol/L — ABNORMAL LOW (ref 22–32)
Calcium: 8.9 mg/dL (ref 8.9–10.3)
Chloride: 104 mmol/L (ref 98–111)
Creatinine, Ser: 1.68 mg/dL — ABNORMAL HIGH (ref 0.44–1.00)
GFR, Estimated: 37 mL/min — ABNORMAL LOW (ref >60)
Glucose, Bld: 68 mg/dL — ABNORMAL LOW (ref 70–99)
Potassium: 4.3 mmol/L (ref 3.5–5.1)
Sodium: 134 mmol/L — ABNORMAL LOW (ref 135–145)
Total Bilirubin: 7.7 mg/dL — ABNORMAL HIGH (ref 0.3–1.2)
Total Protein: 6.7 g/dL (ref 6.5–8.1)

## 2022-06-29 LAB — CBC WITH DIFFERENTIAL/PLATELET
Abs Immature Granulocytes: 0 10*3/uL (ref 0.00–0.07)
Basophils Absolute: 0 10*3/uL (ref 0.0–0.1)
Basophils Relative: 0 %
Eosinophils Absolute: 0.2 10*3/uL (ref 0.0–0.5)
Eosinophils Relative: 1 %
HCT: 47.2 % — ABNORMAL HIGH (ref 36.0–46.0)
Hemoglobin: 14.7 g/dL (ref 12.0–15.0)
Lymphocytes Relative: 0 %
Lymphs Abs: 0 10*3/uL — ABNORMAL LOW (ref 0.7–4.0)
MCH: 28.2 pg (ref 26.0–34.0)
MCHC: 31.1 g/dL (ref 30.0–36.0)
MCV: 90.4 fL (ref 80.0–100.0)
Monocytes Absolute: 0.8 10*3/uL (ref 0.1–1.0)
Monocytes Relative: 4 %
Neutro Abs: 18.3 10*3/uL — ABNORMAL HIGH (ref 1.7–7.7)
Neutrophils Relative %: 95 %
Platelets: 298 10*3/uL (ref 150–400)
RBC: 5.22 MIL/uL — ABNORMAL HIGH (ref 3.87–5.11)
WBC: 19.3 10*3/uL — ABNORMAL HIGH (ref 4.0–10.5)
nRBC: 0 % (ref 0.0–0.2)
nRBC: 0 /100 WBC

## 2022-06-29 LAB — LACTIC ACID, PLASMA
Lactic Acid, Venous: 2.1 mmol/L (ref 0.5–1.9)
Lactic Acid, Venous: 1.7 mmol/L (ref 0.5–1.9)

## 2022-06-29 MED ORDER — PANTOPRAZOLE SODIUM 40 MG PO TBEC
40.0000 mg | DELAYED_RELEASE_TABLET | Freq: Every day | ORAL | Status: DC
  Administered 2022-06-29 – 2022-07-05 (×7): 40 mg via ORAL
  Filled 2022-06-29 (×7): qty 1

## 2022-06-29 MED ORDER — THIAMINE MONONITRATE 100 MG PO TABS
100.0000 mg | ORAL_TABLET | Freq: Every day | ORAL | Status: DC
  Administered 2022-06-29 – 2022-07-05 (×7): 100 mg via ORAL
  Filled 2022-06-29 (×9): qty 1

## 2022-06-29 MED ORDER — LACTATED RINGERS IV BOLUS (SEPSIS)
1000.0000 mL | Freq: Once | INTRAVENOUS | Status: AC
  Administered 2022-06-29: 1000 mL via INTRAVENOUS

## 2022-06-29 MED ORDER — ATOVAQUONE 750 MG/5ML PO SUSP
750.0000 mg | Freq: Every day | ORAL | Status: DC
  Filled 2022-06-29: qty 5

## 2022-06-29 MED ORDER — ONDANSETRON HCL 4 MG/2ML IJ SOLN
4.0000 mg | Freq: Once | INTRAMUSCULAR | Status: AC
  Administered 2022-06-29: 4 mg via INTRAVENOUS
  Filled 2022-06-29: qty 2

## 2022-06-29 MED ORDER — LACTATED RINGERS IV SOLN: INTRAVENOUS | Status: DC

## 2022-06-29 MED ORDER — VANCOMYCIN HCL IN DEXTROSE 1-5 GM/200ML-% IV SOLN: 1000.0000 mg | Freq: Once | INTRAVENOUS | Status: DC

## 2022-06-29 MED ORDER — SODIUM CHLORIDE 0.9 % IV SOLN
2.0000 g | Freq: Once | INTRAVENOUS | Status: AC
  Administered 2022-06-29: 2 g via INTRAVENOUS
  Filled 2022-06-29: qty 20

## 2022-06-29 MED ORDER — FLUOXETINE HCL 20 MG PO CAPS
40.0000 mg | ORAL_CAPSULE | Freq: Every day | ORAL | Status: DC
  Administered 2022-06-30 – 2022-07-05 (×6): 40 mg via ORAL
  Filled 2022-06-29 (×6): qty 2

## 2022-06-29 MED ORDER — SODIUM CHLORIDE 0.9 % IV SOLN: 2.0000 g | Freq: Once | INTRAVENOUS | Status: DC

## 2022-06-29 MED ORDER — FOLIC ACID 1 MG PO TABS
1.0000 mg | ORAL_TABLET | Freq: Every day | ORAL | Status: DC
  Administered 2022-06-29 – 2022-07-05 (×7): 1 mg via ORAL
  Filled 2022-06-29 (×7): qty 1

## 2022-06-29 MED ORDER — LACTULOSE 10 GM/15ML PO SOLN
20.0000 g | Freq: Three times a day (TID) | ORAL | Status: DC
  Administered 2022-06-30 – 2022-07-04 (×10): 20 g via ORAL
  Filled 2022-06-29 (×13): qty 30

## 2022-06-29 MED ORDER — ADULT MULTIVITAMIN W/MINERALS CH
1.0000 | ORAL_TABLET | Freq: Every day | ORAL | Status: DC
  Administered 2022-06-29 – 2022-07-05 (×7): 1 via ORAL
  Filled 2022-06-29 (×7): qty 1

## 2022-06-29 MED ORDER — HYDROMORPHONE HCL 1 MG/ML IJ SOLN
1.0000 mg | Freq: Once | INTRAMUSCULAR | Status: AC
  Administered 2022-06-29: 1 mg via INTRAVENOUS
  Filled 2022-06-29: qty 1

## 2022-06-29 MED ORDER — VANCOMYCIN VARIABLE DOSE PER UNSTABLE RENAL FUNCTION (PHARMACIST DOSING): Status: DC

## 2022-06-29 MED ORDER — HYDROMORPHONE HCL 1 MG/ML IJ SOLN
0.5000 mg | INTRAMUSCULAR | Status: DC | PRN
  Administered 2022-06-29 – 2022-07-02 (×5): 0.5 mg via INTRAVENOUS
  Filled 2022-06-29 (×5): qty 1

## 2022-06-29 MED ORDER — VANCOMYCIN HCL 1250 MG/250ML IV SOLN
1250.0000 mg | Freq: Once | INTRAVENOUS | Status: AC
  Administered 2022-06-29: 1250 mg via INTRAVENOUS
  Filled 2022-06-29: qty 250

## 2022-06-29 MED ORDER — GADOBUTROL 1 MMOL/ML IV SOLN
6.5000 mL | Freq: Once | INTRAVENOUS | Status: AC | PRN
  Administered 2022-06-29: 6.5 mL via INTRAVENOUS

## 2022-06-29 MED ORDER — OXYCODONE HCL 5 MG PO TABS
10.0000 mg | ORAL_TABLET | Freq: Four times a day (QID) | ORAL | Status: DC | PRN
  Administered 2022-06-30 (×2): 10 mg via ORAL
  Filled 2022-06-29 (×2): qty 2

## 2022-06-29 MED ORDER — CLONAZEPAM 0.5 MG PO TABS
0.5000 mg | ORAL_TABLET | Freq: Two times a day (BID) | ORAL | Status: DC | PRN
  Administered 2022-06-30 – 2022-07-05 (×6): 0.5 mg via ORAL
  Filled 2022-06-29 (×6): qty 1

## 2022-06-29 MED ORDER — LACTATED RINGERS IV BOLUS
1000.0000 mL | Freq: Once | INTRAVENOUS | Status: AC
  Administered 2022-06-30: 1000 mL via INTRAVENOUS

## 2022-06-29 MED ORDER — MORPHINE SULFATE (PF) 4 MG/ML IV SOLN
4.0000 mg | Freq: Once | INTRAVENOUS | Status: AC
  Administered 2022-06-29: 4 mg via INTRAVENOUS
  Filled 2022-06-29: qty 1

## 2022-06-29 MED ORDER — ACETAMINOPHEN 500 MG PO TABS
500.0000 mg | ORAL_TABLET | Freq: Four times a day (QID) | ORAL | Status: DC | PRN
  Administered 2022-07-01: 500 mg via ORAL
  Filled 2022-06-29: qty 1

## 2022-06-29 MED ORDER — ENOXAPARIN SODIUM 40 MG/0.4ML IJ SOSY
40.0000 mg | PREFILLED_SYRINGE | INTRAMUSCULAR | Status: DC
  Administered 2022-06-29: 40 mg via SUBCUTANEOUS
  Filled 2022-06-29: qty 0.4

## 2022-06-29 MED ORDER — OXYCODONE-ACETAMINOPHEN 5-325 MG PO TABS
2.0000 | ORAL_TABLET | Freq: Once | ORAL | Status: AC
  Administered 2022-06-29: 2 via ORAL
  Filled 2022-06-29: qty 2

## 2022-06-29 MED ORDER — LEVETIRACETAM 500 MG PO TABS
750.0000 mg | ORAL_TABLET | Freq: Two times a day (BID) | ORAL | Status: DC
  Administered 2022-06-29 – 2022-07-05 (×12): 750 mg via ORAL
  Filled 2022-06-29 (×12): qty 1

## 2022-06-29 NOTE — ED Provider Triage Note (Signed)
Emergency Medicine Provider Triage Evaluation Note  Briana Sweeney , a 51 y.o. female  was evaluated in triage.  Pt complains of right foot pain.  Patient states that approximate month ago she dropped a crockpot on her right foot.  She was being seen by a provider then and was told "nothing was wrong."  She notes that over the past 3 days, pain, swelling and redness has increased significantly.  She was seen earlier today and told to come the emergency department for further evaluation.  Denies fever, chills, night sweats.  Denies history of IV drug use.  Review of Systems  Positive: See above Negative:   Physical Exam  BP 115/66   Pulse (!) 111   Temp 98.6 F (37 C) (Oral)   Resp 20   LMP  (LMP Unknown) Comment: MENAPAUSE AT 51 YEARS OLD  SpO2 93%  Gen:   Awake, no distress   Resp:  Normal effort  MSK:   Moves extremities without difficulty  Other:    Medical Decision Making  Medically screening exam initiated at 3:41 PM.  Appropriate orders placed.  Briana Sweeney was informed that the remainder of the evaluation will be completed by another provider, this initial triage assessment does not replace that evaluation, and the importance of remaining in the ED until their evaluation is complete.          Wilnette Kales, [place] 06/29/22 [phone]

## 2022-06-29 NOTE — ED Provider Notes (Signed)
[facility] EMERGENCY DEPARTMENT Provider Note   CSN: [id number] Arrival date & time: 06/29/22  1401     History {Add pertinent medical, surgical, social history, OB history to HPI:1} Chief Complaint  Patient presents with   Foot Swelling    Briana Sweeney is a 51 y.o. female.  R foot past week  No fevers  Denies injecting Caucasian female with medical history significant for advanced alcoholic cirrhosis with recurrent ascites requiring paracentesis, esophageal varices with portal hypertension, hepatitis C and seizure disorder - dropped a crack pot on her foot - no ivdu - no drink in months 2/2 liver       Home Medications Prior to Admission medications   Medication Sig Start Date End Date Taking? Authorizing Provider  acamprosate (CAMPRAL) 333 MG tablet Take 2 tablets (666 mg total) by mouth 3 (three) times daily Patient not taking: Reported on 05/13/2022 04/09/22     amoxicillin-clavulanate (AUGMENTIN) 875-125 MG tablet Take 1 tablet by mouth 2 (two) times daily. 05/17/22     ARIPiprazole (ABILIFY) 10 MG tablet Take 1 tablet by mouth every day Patient taking differently: Take 10 mg by mouth every morning. 11/22/21     atovaquone (MEPRON) 750 MG/5ML suspension Take by mouth. 04/09/22   [provider]  clonazePAM (KLONOPIN) 0.5 MG tablet TAKE 1 TABLET BY MOUTH TWICE DAILY Patient taking differently: Take 0.5 mg by mouth 2 (two) times daily as needed for anxiety. 02/14/22     clonazePAM (KLONOPIN) 0.5 MG tablet Take by mouth. 08/30/21   [provider]  diclofenac Sodium (VOLTAREN) 1 % GEL Apply 2 g topically daily. Apply to hands    [provider]  FLUoxetine (PROZAC) 40 MG capsule Take 1 capsule (40 mg total) by mouth daily. Patient taking differently: Take 40 mg by mouth every morning. 11/22/21     FLUoxetine (PROZAC) 40 MG capsule Take 1 capsule by mouth daily. 08/30/21   [provider]  folic acid (FOLVITE) 1 MG tablet  Take by mouth. 10/30/21 10/30/22  [provider]  furosemide (LASIX) 40 MG tablet Take 40 mg by mouth daily.    [provider]  ibuprofen (ADVIL) 800 MG tablet Take 800 mg by mouth 3 (three) times daily as needed. 06/17/22   [provider]  lactulose (CHRONULAC) 10 GM/15ML solution Take 30 mLs (20 g total) by mouth 3 (three) times daily. 01/24/22   Aline August, MD  lactulose, encephalopathy, ([place]) 10 GM/15ML SOLN Take by mouth. 06/25/22 06/25/23  [provider]  levETIRAcetam (KEPPRA) 750 MG tablet Take 1 tablet (750 mg total) by mouth 2 (two) times daily. 07/06/21   Cameron Sprang, MD  Multiple Vitamin [place]) TABS Take 1 tablet by mouth every morning. 10/30/21 10/30/22  [provider]  Multiple Vitamins-Minerals (MULTIVITAMIN WITH MINERALS) tablet Take 1 tablet by mouth daily. 03/12/22 03/12/23  Aline August, MD  nadolol (CORGARD) 20 MG tablet Take 1 tablet (20 mg total) by mouth every morning. 05/15/22 06/15/22  Dessa Phi, DO  OVER THE COUNTER MEDICATION Take 1 capsule by mouth daily. OLLY Ultra brain soft gel    [provider]  pantoprazole (PROTONIX) 40 MG tablet Take 40 mg by mouth daily.    [provider]  potassium chloride (KLOR-CON) 20 MEQ packet Take 1 packet (20 mEq) by mouth daily as directed 05/15/22   Dessa Phi, DO  predniSONE (DELTASONE) 50 MG tablet Take by mouth.    [provider]  romiPLOStim (NPLATE)  125 MCG injection Inject into the skin. Patient not taking: Reported on 05/14/2022 04/09/22   [provider]  spironolactone (ALDACTONE) 50 MG tablet Take 100 mg by mouth daily. 06/30/21   [provider]  thiamine 100 MG tablet Take 1 tablet (100 mg total) by mouth daily. 03/13/22   Aline August, MD      Allergies    Other    Review of Systems   Review of Systems  Physical Exam Updated Vital Signs BP 115/66   Pulse (!) 111   Temp 98.6 F (37 C) (Oral)   Resp 20    LMP  (LMP Unknown) Comment: MENAPAUSE AT 51 YEARS OLD  SpO2 93%  Physical Exam  ED Results / Procedures / Treatments   Labs (all labs ordered are listed, but only abnormal results are displayed) Labs Reviewed  COMPREHENSIVE METABOLIC PANEL - Abnormal; Notable for the following components:      Result Value   Sodium 134 (*)    CO2 18 (*)    Glucose, Bld 68 (*)    BUN 30 (*)    Creatinine, Ser 1.68 (*)    Albumin 2.2 (*)    Alkaline Phosphatase 200 (*)    Total Bilirubin 7.7 (*)    GFR, Estimated 37 (*)    All other components within normal limits  CBC WITH DIFFERENTIAL/PLATELET - Abnormal; Notable for the following components:   WBC 19.3 (*)    RBC 5.22 (*)    HCT 47.2 (*)    Neutro Abs 18.3 (*)    Lymphs Abs 0.0 (*)    All other components within normal limits  LACTIC ACID, PLASMA - Abnormal; Notable for the following components:   Lactic Acid, Venous 2.1 (*)    All other components within normal limits  CULTURE, BLOOD (ROUTINE X 2)  CULTURE, BLOOD (ROUTINE X 2)  LACTIC ACID, PLASMA  HCG, QUANTITATIVE, PREGNANCY    EKG None  Radiology DG Foot Complete Right  Result Date: 06/29/2022 CLINICAL DATA:  Dropped heavy object on foot 1 month ago. Persistent foot pain. EXAM: RIGHT FOOT COMPLETE - 3+ VIEW COMPARISON:  12/01/2019 FINDINGS: There is no evidence of acute fracture or dislocation. Old fracture deformities with internal fixation screws are again seen involving the 1st and 5th metatarsals. No other osseous abnormality identified. Prominent dorsal midfoot soft tissue swelling is noted. IMPRESSION: Prominent dorsal midfoot soft tissue swelling. No evidence of acute fracture or dislocation. Electronically Signed   By: Marlaine Hind M.D.   On: 06/29/2022 17:05    Procedures Procedures  {Document cardiac monitor, telemetry assessment procedure when appropriate:1}  Medications Ordered in ED Medications  oxyCODONE-acetaminophen (PERCOCET/ROXICET) 5-325 MG per tablet 2  tablet (2 tablets Oral Given 06/29/22 1543)    ED Course/ Medical Decision Making/ A&P                           Medical Decision Making Risk Prescription drug management. Decision regarding hospitalization.   ***  {Document critical care time when appropriate:1} {Document review of labs and clinical decision tools ie heart score, Chads2Vasc2 etc:1}  {Document your independent review of radiology images, and any outside records:1} {Document your discussion with family members, caretakers, and with consultants:1} {Document social determinants of health affecting pt's care:1} {Document your decision making why or why not admission, treatments were needed:1} Final Clinical Impression(s) / ED Diagnoses Final diagnoses:  None    Rx / DC Orders ED Discharge Orders  None       

## 2022-06-29 NOTE — Sepsis Progress Note (Signed)
Elink following code sepsis °

## 2022-06-29 NOTE — Progress Notes (Addendum)
Pharmacy Antibiotic Note  Briana Sweeney is a 51 y.o. female for which pharmacy has been consulted for vancomycin dosing for cellulitis.  Patient with a history of alcoholic liver cirrhosis with recurrent ascites, esophageal varices, substance abuse, hepatitis C, and seizure. Patient presenting with rt foot swelling.  SCr 1.68 - 0.56 baseline WBC 19.3; LA 2.1; T afeb; HR 111; RR 24  Plan: Rocephin x1 in ED Vancomycin 1250 mg once IV - repeat dosing per level until renal function stable Trend WBC, Fever, Renal function F/u cultures, clinical course, WBC, fever De-escalate when able     Temp (24hrs), Avg:98.6 F (37 C), Min:98.6 F (37 C), Max:98.6 F (37 C)  Recent Labs  Lab 06/26/22 0000 06/29/22 1540  WBC 17.3 19.3*  CREATININE  --  1.68*  LATICACIDVEN  --  2.1*    Estimated Creatinine Clearance: 32.9 mL/min (A) (by C-G formula based on SCr of 1.68 mg/dL (H)).    Allergies  Allergen Reactions   Other Other (See Comments)    Platelets: Rx chest pain, tremors, body aches  Patient stated that she receives platelet injection every Wednesday     Antimicrobials this admission: rocephin 10/21 >>  vancomycin 10/21 >>  Microbiology results: Pending  Thank you for allowing pharmacy to be a part of this patient's care.  Lorelei Pont, PharmD, BCPS 06/29/2022 5:49 PM ED Clinical Pharmacist -  [phone]

## 2022-06-29 NOTE — Progress Notes (Signed)
VASCULAR LAB    Right lower extremity venous duplex has been performed.  See CV proc for preliminary results.  Gave verbal report to Dr. Vernell Leep, [facility], RVT 06/29/2022, 6:38 PM

## 2022-06-29 NOTE — H&P (Cosign Needed Addendum)
Date: 06/29/2022               Patient Name:  Briana Sweeney MRN: [id number]  DOB: 28-Sep-1970 Age / Sex: 51 y.o., female   PCP: Marco Collie, MD         Medical Service: Internal Medicine Teaching Service         Attending Physician: Dr. 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  She states that a month ago she dropped a crock pot on her right foot.  Patient denies any puncture wounds.  She denies any bleeding after the accident.  She denies any broken glass.  She denies any burns as the the pot was not hot.  Since she has developed redness, swelling, and pain to her right foot.  She notes that the redness has went all the way up to her mid shin, and has come back down to her foot.  She notes that she has had some color changes to her foot with new blue spots near her lateral malleolus and her medial malleolus.  Patient does report that she went to go see her family doctor after the incident who prescribed her 800 mg ibuprofen.  Her family doctor sent her to [facility] for imaging, which was negative for any fractures, but did show bruising.  She states that this ibuprofen did not help her pain.  She denies any fevers, shortness of breath, or chest pain but does report having chills.  She also reports having appetite loss.  She notes that she has not been eating or drinking well, as she is not able to  get up and move to make herself something to eat. She does note that she has some abdominal distention, but she is not too concerned about it.  Patient reports that the pain never went away, the redness never went away, and continues to get worse.  Patient reports that yesterday she went to the orthopedic doctor who states that she should go to the emergency room for admission for cellulitis.  Patient's most recent seizure was about 6 months ago.  Patient has been compliant with all her medications except for lactulose, as she has been having a week long history of diarrhea.  She reports that she is out of her Klonopin as well.  Of note, patient was recently discharged on 05/15/2022 for acute decompensated liver cirrhosis with ascites requiring paracentesis.  ED Course: Patient initially presented to the emergency room with concerns of right foot pain.  Patient was afebrile with heart rate into the 110s.  Respiratory rate 20.  Blood pressure within the 110s.  Patient's oxygen saturation 96% on room air.  Initial labs showed sodium 134, creatinine 1.68, elevated alk phos 200, bilirubin 7.7.  Glucose 68.  White count elevated at 19.  Lactate 2.1.  Blood cultures were collected.  Imaging negative for any fractures.  Ultrasound negative for any DVT.  Code sepsis was activated.  In the emergency room, patient was started on ceftriaxone as well as vancomycin for cellulitis.  Patient was also given Dilaudid 1 mg, Percocet $RemoveBefo'5mg'[id number]$  and morphine 4 mg for pain.  Patient also received 1 L lactated Ringer's in the emergency room.  [facility] was consulted for further management.   Meds:  No outpatient medications have been marked as taking for the 06/29/22 encounter [facility] Encounter).   Home meds include: Campral 2 tablets 3 times daily Coreg 3.125 mg twice daily Atovaquone daily Clonazepam 0.5 mg twice daily Voltaren gel 2 g daily to hands Fluoxetine 40 mg daily Lasix 40 mg daily Lactulose 30 mL by mouth 3 times  daily Keppra 750 mg twice daily Nadolol 20 mg daily Pantoprazole 40 mg daily Potassium chloride 20 meq daily Romiplostim 145mcg Spironolactone 100 mg daily Thiamine 100 mg daily  Past Medical History  Past Surgical History:  Procedure Laterality Date   BUNIONECTOMY     ESOPHAGEAL BANDING  06/26/2021   Procedure: ESOPHAGEAL BANDING;  Surgeon: Jackquline Denmark, MD;  Location: [facility] ENDOSCOPY;  Service: Endoscopy;;   ESOPHAGOGASTRODUODENOSCOPY N/A 09/03/2017   Procedure: ESOPHAGOGASTRODUODENOSCOPY (EGD);  Surgeon: Doran Stabler, MD;  Location: [place];  Service: Gastroenterology;  Laterality: N/A;   ESOPHAGOGASTRODUODENOSCOPY N/A 02/01/2022   Procedure: ESOPHAGOGASTRODUODENOSCOPY (EGD);  Surgeon: Clarene Essex, MD;  Location: [facility] ENDOSCOPY;  Service: Gastroenterology;  Laterality: N/A;   ESOPHAGOGASTRODUODENOSCOPY (EGD) WITH PROPOFOL N/A 06/26/2021   Procedure: ESOPHAGOGASTRODUODENOSCOPY (EGD) WITH PROPOFOL;  Surgeon: Jackquline Denmark, MD;  Location: [facility] ENDOSCOPY;  Service: Endoscopy;  Laterality: N/A;   FINGER FRACTURE SURGERY Left    "shattered my pinky"   FRACTURE SURGERY     I & D EXTREMITY Left 09/18/2018   Procedure: IRRIGATION AND DEBRIDEMENT EXTREMITY;  Surgeon: Leanora Cover, MD;  Location: [facility]S;  Service: Orthopedics;  Laterality: Left;   IR PARACENTESIS  07/23/2017   IR PARACENTESIS  07/2017   "did it twice in the same week" (09/01/2017)   IR PARACENTESIS  06/26/2021   SHOULDER OPEN ROTATOR CUFF REPAIR Right    TOOTH EXTRACTION  08/2019   TUBAL LIGATION     VAGINAL HYSTERECTOMY      Social:  Lives With: Lives alone Support: Good support Level of Function: Independent of all ADLs and IADLs PCP: Dr. Marco Collie Substances: Last alcohol use in July 2022, Denies tobacco use. Denies illicit drug use.   Family History: Reports family history of hypertension in mother.  Esophageal cancer in aunt and uncles.  Allergies: Allergies as of 06/29/2022 - Review Complete  06/29/2022  Allergen Reaction Noted   Other Other (See Comments) 12/15/2019    Review of Systems: A complete ROS was negative except as per HPI.   OBJECTIVE:   Physical Exam: Blood pressure 97/66, pulse (!) 104, temperature 97.6 F (36.4 C), temperature source Oral, resp. rate (!) 24, SpO2 96 %.  Constitutional: Ill-appearing and pain upon my exam HENT: Normocephalic, atraumatic, dry mucous membranes Eyes: Scleral icterus noted Cardiovascular: Tachycardic, no murmurs, rubs, or gallops, 2+ pulses to bilateral upper and lower extremities Pulmonary/Chest: Normal work of breathing on room air, decreased breath sounds noted at left lung base, otherwise clear to auscultation bilaterally Abdominal: Distention noted, with positive fluid wave.  Normoactive bowel sounds.  Nontender Extremities: Right lower extremity with erythema and edema noted to dorsal aspect of anterior foot.  Warmth noted as well.  Tender to palpitation.  No drainage or oozing noted.  No  bleeding noted.   3 bulla noted, 2 of which are blue.         Labs: CBC    Component Value Date/Time   WBC 19.3 (H) 06/29/2022 1540   RBC 5.22 (H) 06/29/2022 1540   HGB 14.7 06/29/2022 1540   HGB 14.3 11/15/2020 1034   HGB 14.4 01/04/2020 1510   HCT 47.2 (H) 06/29/2022 1540   HCT 43.4 01/04/2020 1510   PLT 298 06/29/2022 1540   PLT 121 (L) 11/15/2020 1034   PLT 60 (LL) 01/04/2020 1510   MCV 90.4 06/29/2022 1540   MCV 87 04/15/2022 0000   MCH 28.2 06/29/2022 1540   MCHC 31.1 06/29/2022 1540   RDW Not Measured 06/29/2022 1540   RDW 16.0 (H) 01/04/2020 1510   LYMPHSABS 0.0 (L) 06/29/2022 1540   LYMPHSABS 0.9 01/04/2020 1510   MONOABS 0.8 06/29/2022 1540   EOSABS 0.2 06/29/2022 1540   EOSABS 0.1 01/04/2020 1510   BASOSABS 0.0 06/29/2022 1540   BASOSABS 0.0 01/04/2020 1510     CMP     Component Value Date/Time   NA 134 (L) 06/29/2022 1540   NA 131 (A) 05/06/2022 0000   K 4.3 06/29/2022 1540   CL 104 06/29/2022  1540   CO2 18 (L) 06/29/2022 1540   GLUCOSE 68 (L) 06/29/2022 1540   BUN 30 (H) 06/29/2022 1540   BUN 11 05/06/2022 0000   CREATININE 1.68 (H) 06/29/2022 1540   CREATININE 0.64 11/15/2020 1034   CREATININE 0.54 05/01/2020 1043   CALCIUM 8.9 06/29/2022 1540   PROT 6.7 06/29/2022 1540   PROT 8.3 01/04/2020 1510   ALBUMIN 2.2 (L) 06/29/2022 1540   ALBUMIN 3.9 01/04/2020 1510   AST 34 06/29/2022 1540   AST 30 11/15/2020 1034   ALT 25 06/29/2022 1540   ALT 17 11/15/2020 1034   ALKPHOS 200 (H) 06/29/2022 1540   BILITOT 7.7 (H) 06/29/2022 1540   BILITOT 1.6 (H) 11/15/2020 1034   GFRNONAA 37 (L) 06/29/2022 1540   GFRNONAA >60 11/15/2020 1034   GFRNONAA 115 04/22/2019 1305   GFRAA >60 06/07/2020 0941   GFRAA 133 04/22/2019 1305    Imaging: X-ray right foot with no acute fractures or dislocations. Lower extremity ultrasound of right lower extremity negative for any DVT MRI right foot pending  ASSESSMENT & PLAN:   Assessment & Plan by Problem: Principal Problem:   Sepsis ([place])   Briana Sweeney is a 51 y.o. 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  #Sepsis secondary to right foot cellulitis #Leukocytosis likely secondary to cellulitis Patient initially presented with right foot pain after a crockpot fell on her right foot a month ago.  Pain has been unresolved.  Patient continued to have erythema and swelling noted.  Vital signs showed tachycardia with heart rate of 111 initially.  Patient was normotensive and satting at 96% on room air.  Initial labs showed elevated lactate of 2.1.  White blood cell count elevated at 19.3.  On exam, there is notable swelling, erythema, and warmth noted to right lower extremity.  This is likely the source of  infection.  Given swelling, DVT was considered, and vascular studies were negative.  Given swelling, fracture is also considered, but negative.  No crepitus on exam and no pain out of proportion, less  likely necrotizing fasciitis.  No oozing or drainage noted.  This is more likely cellulitis leading to sepsis.  Plan will be to start antibiotics and as well as fluids.   -Start ceftriaxone -Start vancomycin -Continue LR infusion -Pain control with oxycodone 10 mg every 6 as needed with Dilaudid 0.5 mg every 4 for breakthrough pain -Continue to monitor white blood cell count -Trend fever curve -Follow blood cultures -MRI right foot pending   #Prerenal AKI Patient's creatinine elevated to 1.68 today from baseline of 0.5.  It is likely in the setting of dehydration and decreased p.o. intake.  Could also be related to ibuprofen use, but less likely given she took fifteen 800 mg ibuprofens over the month.  We will give fluids to see if this helps resolve. -Started LR infusion of 150 cc/hr  -Bolus 1 L LR -Monitor CMP  #Alcoholic liver cirrhosis #Hepatitis C, treated per charting  Patient has a past medical history of alcoholic liver cirrhosis.  Patient does endorse that she has had some abdominal distention.  Patient gets monthly paracenteses.  Patient also has a history of portal hypertension with esophageal varices with banding.  On exam, patient has scleral icterus as well as positive fluid wave.  Liver enzymes within normal limits.  Alk phos elevated at 200.  Total bilirubin 7.7. This is likely elevated in the setting of sepsis. Patient does report that she has not had any alcohol since July 2023.  Patient normally takes lactulose, nadolol, Coreg, Lasix, spironolactone. 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  Patient takes weekly Nplate injections.  Patient's platelet count today is 298.  Continue to monitor platelet count. -Monitor CBC  #History of seizures Patient reports that she has a history of seizures since she was 51 years old.  Patient reports that her last seizure was 6 months ago.  Patient normally takes Keppra 750 mg twice daily. Can resume home Keppra 750 mg twice daily  #GERD -Resume home pantoprazole 40 mg daily  #Anxiety -Resume home Prozac 40 mg daily -Resume home Klonopin 0.5 mg twice daily   Diet: Normal VTE: Enoxaparin IVF: LR Code: Full  Prior to Admission Living Arrangement: Home, living alone Anticipated Discharge Location: Home Barriers to Discharge: Clinical improvement  Dispo: Admit patient to Inpatient with expected length of stay greater than 2 midnights.  Signed: Leigh Aurora, [place] Internal Medicine Resident PGY-1 [phone] 06/29/2022, 10:00 PM

## 2022-06-29 NOTE — ED Notes (Signed)
Called lab to inquire about lactic that was previously sent. They advised they did not receive specimen. Pt currently in MRI. Lactic re-draw delayed.

## 2022-06-29 NOTE — ED Triage Notes (Addendum)
Patient w/ swelling and redness to the R foot. Patient has been seen for same in the past and she was told it was bruised but foot is now red, swollen, painful. Patient denies any fevers. Wound visualized to the lateral aspect of the foot. Aox4.

## 2022-06-29 NOTE — ED Notes (Signed)
Patient transported to MRI 

## 2022-06-30 DIAGNOSIS — B182 Chronic viral hepatitis C: Secondary | ICD-10-CM

## 2022-06-30 DIAGNOSIS — R17 Unspecified jaundice: Secondary | ICD-10-CM

## 2022-06-30 DIAGNOSIS — B9562 Methicillin resistant Staphylococcus aureus infection as the cause of diseases classified elsewhere: Secondary | ICD-10-CM

## 2022-06-30 DIAGNOSIS — R7881 Bacteremia: Secondary | ICD-10-CM | POA: Diagnosis not present

## 2022-06-30 DIAGNOSIS — K7031 Alcoholic cirrhosis of liver with ascites: Secondary | ICD-10-CM

## 2022-06-30 DIAGNOSIS — M869 Osteomyelitis, unspecified: Secondary | ICD-10-CM | POA: Diagnosis present

## 2022-06-30 DIAGNOSIS — M86271 Subacute osteomyelitis, right ankle and foot: Secondary | ICD-10-CM

## 2022-06-30 LAB — URINALYSIS, COMPLETE (UACMP) WITH MICROSCOPIC
Glucose, UA: NEGATIVE mg/dL
Hgb urine dipstick: NEGATIVE
Ketones, ur: NEGATIVE mg/dL
Nitrite: NEGATIVE
Protein, ur: NEGATIVE mg/dL
Specific Gravity, Urine: 1.02 (ref 1.005–1.030)
pH: 5 (ref 5.0–8.0)

## 2022-06-30 LAB — BASIC METABOLIC PANEL
Anion gap: 12 (ref 5–15)
BUN: 34 mg/dL — ABNORMAL HIGH (ref 6–20)
CO2: 20 mmol/L — ABNORMAL LOW (ref 22–32)
Calcium: 8 mg/dL — ABNORMAL LOW (ref 8.9–10.3)
Chloride: 100 mmol/L (ref 98–111)
Creatinine, Ser: 1.66 mg/dL — ABNORMAL HIGH (ref 0.44–1.00)
GFR, Estimated: 37 mL/min — ABNORMAL LOW (ref >60)
Glucose, Bld: 99 mg/dL (ref 70–99)
Potassium: 4.6 mmol/L (ref 3.5–5.1)
Sodium: 132 mmol/L — ABNORMAL LOW (ref 135–145)

## 2022-06-30 LAB — COMPREHENSIVE METABOLIC PANEL
ALT: 20 U/L (ref 0–44)
AST: 26 U/L (ref 15–41)
Albumin: 1.8 g/dL — ABNORMAL LOW (ref 3.5–5.0)
Alkaline Phosphatase: 141 U/L — ABNORMAL HIGH (ref 38–126)
Anion gap: 12 (ref 5–15)
BUN: 32 mg/dL — ABNORMAL HIGH (ref 6–20)
CO2: 19 mmol/L — ABNORMAL LOW (ref 22–32)
Calcium: 8.3 mg/dL — ABNORMAL LOW (ref 8.9–10.3)
Chloride: 103 mmol/L (ref 98–111)
Creatinine, Ser: 2.12 mg/dL — ABNORMAL HIGH (ref 0.44–1.00)
GFR, Estimated: 28 mL/min — ABNORMAL LOW (ref >60)
Glucose, Bld: 54 mg/dL — ABNORMAL LOW (ref 70–99)
Potassium: 4.9 mmol/L (ref 3.5–5.1)
Sodium: 134 mmol/L — ABNORMAL LOW (ref 135–145)
Total Bilirubin: 6.8 mg/dL — ABNORMAL HIGH (ref 0.3–1.2)
Total Protein: 5.3 g/dL — ABNORMAL LOW (ref 6.5–8.1)

## 2022-06-30 LAB — BLOOD CULTURE ID PANEL (REFLEXED) - BCID2

## 2022-06-30 LAB — CBC
HCT: 41.7 % (ref 36.0–46.0)
Hemoglobin: 12.4 g/dL (ref 12.0–15.0)
MCH: 27.6 pg (ref 26.0–34.0)
MCHC: 29.7 g/dL — ABNORMAL LOW (ref 30.0–36.0)
MCV: 92.7 fL (ref 80.0–100.0)
Platelets: 208 10*3/uL (ref 150–400)
RBC: 4.5 MIL/uL (ref 3.87–5.11)
WBC: 18.3 10*3/uL — ABNORMAL HIGH (ref 4.0–10.5)
nRBC: 0 % (ref 0.0–0.2)

## 2022-06-30 LAB — HCG, QUANTITATIVE, PREGNANCY: hCG, Beta Chain, Quant, S: 3 m[IU]/mL (ref <5)

## 2022-06-30 LAB — GLUCOSE, CAPILLARY
Glucose-Capillary: 83 mg/dL (ref 70–99)
Glucose-Capillary: 74 mg/dL (ref 70–99)
Glucose-Capillary: 78 mg/dL (ref 70–99)
Glucose-Capillary: 105 mg/dL — ABNORMAL HIGH (ref 70–99)

## 2022-06-30 LAB — LACTIC ACID, PLASMA
Lactic Acid, Venous: 2.2 mmol/L (ref 0.5–1.9)
Lactic Acid, Venous: 2.7 mmol/L (ref 0.5–1.9)

## 2022-06-30 LAB — PROTIME-INR
INR: 1.4 — ABNORMAL HIGH (ref 0.8–1.2)
Prothrombin Time: 17.1 seconds — ABNORMAL HIGH (ref 11.4–15.2)

## 2022-06-30 MED ORDER — DEXTROSE IN LACTATED RINGERS 5 % IV SOLN: INTRAVENOUS | Status: DC

## 2022-06-30 MED ORDER — HYDROMORPHONE HCL 2 MG PO TABS
4.0000 mg | ORAL_TABLET | ORAL | Status: DC | PRN
  Administered 2022-06-30 – 2022-07-05 (×16): 4 mg via ORAL
  Filled 2022-06-30 (×16): qty 2

## 2022-06-30 MED ORDER — ONDANSETRON HCL 4 MG/2ML IJ SOLN
4.0000 mg | INTRAMUSCULAR | Status: DC | PRN
  Administered 2022-06-30 (×2): 4 mg via INTRAVENOUS
  Filled 2022-06-30 (×2): qty 2

## 2022-06-30 MED ORDER — SODIUM CHLORIDE 0.9 % IV SOLN
2.0000 g | INTRAVENOUS | Status: DC
  Administered 2022-06-30: 2 g via INTRAVENOUS
  Filled 2022-06-30: qty 20

## 2022-06-30 MED ORDER — ORAL CARE MOUTH RINSE: 15.0000 mL | OROMUCOSAL | Status: DC | PRN

## 2022-06-30 MED ORDER — SODIUM CHLORIDE 0.9 % IV SOLN
500.0000 mg | INTRAVENOUS | Status: DC
  Administered 2022-06-30: 500 mg via INTRAVENOUS
  Filled 2022-06-30: qty 10

## 2022-06-30 MED ORDER — ENOXAPARIN SODIUM 30 MG/0.3ML IJ SOSY
30.0000 mg | PREFILLED_SYRINGE | INTRAMUSCULAR | Status: DC
  Administered 2022-06-30: 30 mg via SUBCUTANEOUS
  Filled 2022-06-30: qty 0.3

## 2022-06-30 MED ORDER — LACTATED RINGERS IV BOLUS (SEPSIS)
1000.0000 mL | Freq: Once | INTRAVENOUS | Status: AC
  Administered 2022-06-30: 1000 mL via INTRAVENOUS

## 2022-06-30 NOTE — Progress Notes (Signed)
Mobility Specialist Progress Note:   06/30/22 1345  Mobility  Activity  (transferred to bed from stretcher)  Level of Assistance Standby assist, set-up cues, supervision of patient - no hands on  Activity Response Tolerated poorly  $Mobility charge 1 Mobility   NT requesting assistance with transfer. Pt states she cannot stand and pivot at this time d/t R foot infection. Pt able to laterally scoot to bed with no assistance. Will f/u for further mobility as pt progresses.  Nelta Numbers Acute Rehab Secure Chat or Office Phone: [phone]

## 2022-06-30 NOTE — Progress Notes (Signed)
Dr. Eulas Post made aware of yellow MEWS including HR, BP and increased pain, Oxy ineffective, Dilaudid given, Dr. Eulas Post will come and see pt. To evaluate  Briana Sweeney

## 2022-06-30 NOTE — Consult Note (Signed)
ORTHOPAEDIC CONSULTATION  REQUESTING PHYSICIAN: Axel Filler, *  PCP:  Marco Collie, MD  Chief Complaint: Right foot pain  HPI: Briana Sweeney is a 51 y.o. 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  She states that a month ago she dropped a crock pot on her right foot.  Patient denies any puncture wounds.  She denies any bleeding after the accident.  She denies any broken glass.  She denies any burns as the the pot was not hot.  Since she has developed redness, swelling, and pain to her right foot.  She notes that the redness has went all the way up to her mid shin, and has come back down to her foot.  She notes that she has had some color changes to her foot with new blue spots near her lateral malleolus and her medial malleolus.  Patient does report that she went to go see her family doctor after the incident who prescribed her 800 mg ibuprofen.  Her family doctor sent her to [facility] for imaging, which was negative for any fractures, but did show bruising.  She states that this ibuprofen did not help her pain.  She denies any fevers, shortness of breath, or chest pain but does report having chills.  She also reports having appetite loss.  She notes that she has not been eating or drinking well, as she is not able to get up and move to make herself something to eat. She does note that she has some abdominal distention, but she is not too concerned about it.  Patient reports that the pain never went away, the redness never went away, and continues to get worse.  Patient reports that yesterday she went to the orthopedic doctor who states that she should go to the emergency room for admission for cellulitis.  Patient's most recent seizure was about 6 months ago.   Patient has been compliant with all her medications except for lactulose, as she has been having a week long history of diarrhea.  She reports that she is out of her Klonopin as well.   On exam in the ER today, patient is drowsy likely related to pain medication. She tells me that her injury was about 3 weeks ago, but she only developed the appearance of blood under her skin about 4 days ago. She has no questions or concerns.   Past Medical History:  Diagnosis Date   Abscess of bursa of left elbow 09/23/2018   Acute hyperactive alcohol withdrawal delirium ([place]) 10/09/2017   Acute low back pain with bilateral sciatica    Acute metabolic encephalopathy 36/14/4315   Acute on chronic pancreatitis ([place]) 04/04/2021   AKI (acute kidney injury) ([place]) 04/04/2021   Alcohol withdrawal syndrome with complication ([place]) 40/04/6760   Alcohol-induced acute pancreatitis    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[place]) 12/05/2018   Ascites due to alcoholic cirrhosis ([place])    Bipolar affective disorder ([place])    With anxiety features   Bunionette of right foot 11/2019   Cirrhosis of liver ([place])    Due to alcohol and hepatitis C   Cocaine dependence without complication ([place]) 95/05/3266   Decompensated hepatic cirrhosis ([place]) 07/23/2017   Depression with anxiety    Epistaxis 01/20/2019   Erysipelas 09/13/2018   Esophageal 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 ([place])  Esophageal varices without bleeding (HCC)    ETOHism (HCC)    GERD (gastroesophageal reflux disease)    Head injury    Hematemesis 02/10/2018   Hepatitis C 2018   hepatitis c and alcohol related hepatitis   Heroin abuse ([place])    History of blood transfusion    "blood doesn't clot; I fell down and had to have a transfusion"   History of kidney stones    Menopause 2016   Migraine    "when I get really stressed" (09/01/2017)   Neuropathy 10/27/2018   Olecranon bursitis of left elbow    Other mixed anxiety disorders 10/27/2018   Overdose 08/22/2018   Pancytopenia  ([place]) 07/23/2017   Polysubstance abuse ([place]) 04/08/2018   Portal hypertension ([place]) 03/18/2018   S/P foot surgery, right 11/2019   Schizophrenia ([place])    Seizures ([place])    "when I run out of my RX; lots recently" (09/01/2017)   Suicidal ideation    Thrombocytopenia ([place])    Trichimoniasis 10/30/2017   Upper GI bleed 02/10/2018   Urinary urgency 05/2020   UTI (urinary tract infection) 06/02/2017   Past Surgical History:  Procedure Laterality Date   BUNIONECTOMY     ESOPHAGEAL BANDING  06/26/2021   Procedure: ESOPHAGEAL BANDING;  Surgeon: Jackquline Denmark, MD;  Location: [facility] ENDOSCOPY;  Service: Endoscopy;;   ESOPHAGOGASTRODUODENOSCOPY N/A 09/03/2017   Procedure: ESOPHAGOGASTRODUODENOSCOPY (EGD);  Surgeon: Doran Stabler, MD;  Location: [place];  Service: Gastroenterology;  Laterality: N/A;   ESOPHAGOGASTRODUODENOSCOPY N/A 02/01/2022   Procedure: ESOPHAGOGASTRODUODENOSCOPY (EGD);  Surgeon: Clarene Essex, MD;  Location: [facility] ENDOSCOPY;  Service: Gastroenterology;  Laterality: N/A;   ESOPHAGOGASTRODUODENOSCOPY (EGD) WITH PROPOFOL N/A 06/26/2021   Procedure: ESOPHAGOGASTRODUODENOSCOPY (EGD) WITH PROPOFOL;  Surgeon: Jackquline Denmark, MD;  Location: [facility] ENDOSCOPY;  Service: Endoscopy;  Laterality: N/A;   FINGER FRACTURE SURGERY Left    "shattered my pinky"   FRACTURE SURGERY     I & D EXTREMITY Left 09/18/2018   Procedure: IRRIGATION AND DEBRIDEMENT EXTREMITY;  Surgeon: Leanora Cover, MD;  Location: [facility];  Service: Orthopedics;  Laterality: Left;   IR PARACENTESIS  07/23/2017   IR PARACENTESIS  07/2017   "did it twice in the same week" (09/01/2017)   IR PARACENTESIS  06/26/2021   SHOULDER OPEN ROTATOR CUFF REPAIR Right    TOOTH EXTRACTION  08/2019   TUBAL LIGATION     VAGINAL HYSTERECTOMY     Social History   Socioeconomic History   Marital status: Divorced    Spouse name: Not on file   Number of children: 2   Years of education: Not on file   Highest education level: Not on file   Occupational History   Occupation: applying for disability  Tobacco Use   Smoking status: Never   Smokeless tobacco: Never  Vaping Use   Vaping Use: Never used  Substance and Sexual Activity   Alcohol use: Yes    Alcohol/week: 6.0 standard drinks of alcohol    Types: 6 Cans of beer per week    Comment: pt states she had 1 can beer on 02/26/22   Drug use: Not Currently   Sexual activity: Not on file  Other Topics Concern   Not on file  Social History Narrative   She moved with a boyfriend to [place] and was [facility].  He died of a massive heart attack in 8/18, per her report, and so she moved back to [place] and is living with a friend.  Right handed   2 story home    Lives alone 11/09/19   Social Determinants of Health   Financial Resource Strain: Not on file  Food Insecurity: No Food Insecurity (05/14/2022)   Hunger Vital Sign    Worried About Running Out of Food in the Last Year: Never true    Ran Out of Food in the Last Year: Never true  Transportation Needs: No Transportation Needs (05/14/2022)   PRAPARE - Hydrologist (Medical): No    Lack of Transportation (Non-Medical): No  Physical Activity: Not on file  Stress: Not on file  Social Connections: Not on file   Family History  Problem Relation Age of Onset   Lung cancer Mother 78   Alcohol abuse Mother    Throat cancer Father 31   Allergies  Allergen Reactions   Other Other (See Comments)    Platelets: Rx chest pain, tremors, body aches  Patient stated that she receives platelet injection every Wednesday    Prior to Admission medications   Medication Sig Start Date End Date Taking? Authorizing Provider  acamprosate (CAMPRAL) 333 MG tablet Take 2 tablets (666 mg total) by mouth 3 (three) times daily Patient not taking: Reported on 05/13/2022 04/09/22     amoxicillin-clavulanate (AUGMENTIN) 875-125 MG tablet Take 1 tablet by mouth 2 (two) times daily. 05/17/22      ARIPiprazole (ABILIFY) 10 MG tablet Take 1 tablet by mouth every day Patient taking differently: Take 10 mg by mouth every morning. 11/22/21     atovaquone (MEPRON) 750 MG/5ML suspension Take by mouth. 04/09/22   [provider]  clonazePAM (KLONOPIN) 0.5 MG tablet TAKE 1 TABLET BY MOUTH TWICE DAILY Patient taking differently: Take 0.5 mg by mouth 2 (two) times daily as needed for anxiety. 02/14/22     clonazePAM (KLONOPIN) 0.5 MG tablet Take by mouth. 08/30/21   [provider]  diclofenac Sodium (VOLTAREN) 1 % GEL Apply 2 g topically daily. Apply to hands    [provider]  FLUoxetine (PROZAC) 40 MG capsule Take 1 capsule (40 mg total) by mouth daily. Patient taking differently: Take 40 mg by mouth every morning. 11/22/21     FLUoxetine (PROZAC) 40 MG capsule Take 1 capsule by mouth daily. 08/30/21   [provider]  folic acid (FOLVITE) 1 MG tablet Take by mouth. 10/30/21 10/30/22  [provider]  furosemide (LASIX) 40 MG tablet Take 40 mg by mouth daily.    [provider]  ibuprofen (ADVIL) 800 MG tablet Take 800 mg by mouth 3 (three) times daily as needed. 06/17/22   [provider]  lactulose (CHRONULAC) 10 GM/15ML solution Take 30 mLs (20 g total) by mouth 3 (three) times daily. 01/24/22   Aline August, MD  lactulose, encephalopathy, ([place]) 10 GM/15ML SOLN Take by mouth. 06/25/22 06/25/23  [provider]  levETIRAcetam (KEPPRA) 750 MG tablet Take 1 tablet (750 mg total) by mouth 2 (two) times daily. 07/06/21   Cameron Sprang, MD  Multiple Vitamin [place]is [place]ock) TABS Take 1 tablet by mouth every morning. 10/30/21 10/30/22  [provider]  Multiple Vitamins-Minerals (MULTIVITAMIN WITH MINERALS) tablet Take 1 tablet by mouth daily. 03/12/22 03/12/23  Aline August, MD  nadolol (CORGARD) 20 MG tablet Take 1 tablet (20 mg total) by mouth every morning. 05/15/22 06/15/22  Dessa Phi, DO  OVER THE COUNTER MEDICATION  Take 1 capsule by mouth daily. OLLY Ultra brain soft gel    [provider]  pantoprazole (PROTONIX) 40 MG tablet Take 40 mg by mouth daily.    [provider]  potassium chloride (KLOR-CON) 20 MEQ packet Take 1 packet (20 mEq) by mouth daily as directed 05/15/22   Dessa Phi, DO  predniSONE (DELTASONE) 50 MG tablet Take by mouth.    [provider]  romiPLOStim (NPLATE) 125 MCG injection Inject into the skin. Patient not taking: Reported on 05/14/2022 04/09/22   [provider]  spironolactone (ALDACTONE) 50 MG tablet Take 100 mg by mouth daily. 06/30/21   [provider]  thiamine 100 MG tablet Take 1 tablet (100 mg total) by mouth daily. 03/13/22   Aline August, MD   MR FOOT RIGHT W WO CONTRAST  Result Date: 06/30/2022 CLINICAL DATA:  Foot swelling, nondiabetic, osteomyelitis suspected EXAM: MRI OF THE RIGHT FOREFOOT WITHOUT AND WITH CONTRAST TECHNIQUE: Multiplanar, multisequence MR imaging of the right forefoot was performed before and after the administration of intravenous contrast. CONTRAST:  6.45m GADAVIST GADOBUTROL 1 MMOL/ML IV SOLN COMPARISON:  X-ray 06/29/2022 FINDINGS: Bones/Joint/Cartilage Intense bone marrow edema with enhancement involving multiple bones within the midfoot including the cuboid, medial cuneiform, intermediate cuneiform, lateral cuneiform as well as the bases of the second through fifth metatarsals. Appearance is most compatible with acute osteomyelitis. There are small effusions throughout the midfoot and across the TMT joints concerning for septic arthritis. Nondisplaced fractures at these locations are not excluded. Mild marrow edema within the navicular. Susceptibility artifact from hardware within the first and fifth metatarsals. No dislocations. Ligaments Lisfranc ligament is thickened and heterogeneous, which could reflect sequela of prior trauma or reactive changes from local inflammation. Intact collateral ligaments.  Muscles and Tendons Large volume tenosynovitis involving the extensor hallucis longus tendon and extensor digitorum tendons within the forefoot and midfoot. These tenosynovial fluid collections appear contiguous with adjacent rim enhancing abscesses within the soft tissues of the midfoot and proximal forefoot. Focal dorsal midfoot collection predominantly involving the extensor digitorum tendons measures approximately 4.0 x 1.7 x 4.0 cm (series 4, image 10). More medially located dorsal midfoot collection measures approximately 4.0 x 2.0 x 2.5 cm (series 4, image 4). More laterally located collection within the midfoot measures approximately 2.7 x 1.3 x 2.7 cm (series 9, image 5). Focal tenosynovitis at the knot of Henry within the plantar midfoot. Diffuse edema-like signal throughout the foot musculature. Soft tissues Marked soft tissue swelling of the forefoot and midfoot. Wound or ulceration at the dorso lateral aspect of the midfoot. IMPRESSION: 1. Findings are most compatible with extensive osteomyelitis throughout the bones of the midfoot and the bases of the second through fifth metatarsals. Small joint effusions throughout the midfoot and within the TMT joints is concerning for septic arthritis. 2. Large volume tenosynovitis involving the extensor hallucis longus tendon and extensor digitorum tendons within the forefoot and midfoot. These tenosynovial fluid collections appear contiguous with multiple adjacent rim enhancing abscesses within the soft tissues of the midfoot and proximal forefoot, as described. 3. Findings of diffuse cellulitis and myositis of the right foot. Electronically Signed   By: NDavina PokeD.O.   On: 06/30/2022 09:39   VAS [place]OWER EXTREMITY VENOUS (DVT) (ONLY [facility])  Result Date: 06/30/2022  Lower Venous DVT Study Patient Name:  Briana Sweeney Date of Exam:   06/29/2022 Medical Rec #: [id number]         Accession #:    [id number] of Birth: 120-Nov-1972  Patient Gender: F Patient Age:   81 years Exam Location:  [facility] Procedure:      VAS [place] LOWER EXTREMITY VENOUS (DVT) Referring Phys: Herbie Baltimore PATERSON --------------------------------------------------------------------------------  Indications: Right foot edema, pain, erythema after dropping a crock pot on foot 1 month ago.  Comparison Study: No prior study on file Performing Technologist: Sharion Dove RVS  Examination Guidelines: A complete evaluation includes B-mode imaging, spectral Doppler, color Doppler, and power Doppler as needed of all accessible portions of each vessel. Bilateral testing is considered an integral part of a complete examination. Limited examinations for reoccurring indications may be performed as noted. The reflux portion of the exam is performed with the patient in reverse Trendelenburg.  +---------+---------------+---------+-----------+----------+--------------+ RIGHT    CompressibilityPhasicitySpontaneityPropertiesThrombus Aging +---------+---------------+---------+-----------+----------+--------------+ CFV      Full           Yes      Yes                                 +---------+---------------+---------+-----------+----------+--------------+ SFJ      Full                                                        +---------+---------------+---------+-----------+----------+--------------+ FV Prox  Full                                                        +---------+---------------+---------+-----------+----------+--------------+ FV Mid   Full                                                        +---------+---------------+---------+-----------+----------+--------------+ FV DistalFull                                                        +---------+---------------+---------+-----------+----------+--------------+ PFV      Full                                                         +---------+---------------+---------+-----------+----------+--------------+ POP      Full           Yes      Yes                                 +---------+---------------+---------+-----------+----------+--------------+ PTV      Full                                                        +---------+---------------+---------+-----------+----------+--------------+  PERO     Full                                                        +---------+---------------+---------+-----------+----------+--------------+ GSV      Full                                                        +---------+---------------+---------+-----------+----------+--------------+   +----+---------------+---------+-----------+----------+--------------+ LEFTCompressibilityPhasicitySpontaneityPropertiesThrombus Aging +----+---------------+---------+-----------+----------+--------------+ CFV Full           Yes      Yes                                 +----+---------------+---------+-----------+----------+--------------+     Summary: RIGHT: - There is no evidence of deep vein thrombosis in the lower extremity.  - No cystic structure found in the popliteal fossa. - Ultrasound characteristics of enlarged, vascularized lymph nodes are noted in the groin.  LEFT: - No evidence of common femoral vein obstruction.  *See table(s) above for measurements and observations. Electronically signed by Servando Snare MD on 06/30/2022 at 9:10:24 AM.    Final    DG Foot Complete Right  Result Date: 06/29/2022 CLINICAL DATA:  Dropped heavy object on foot 1 month ago. Persistent foot pain. EXAM: RIGHT FOOT COMPLETE - 3+ VIEW COMPARISON:  12/01/2019 FINDINGS: There is no evidence of acute fracture or dislocation. Old fracture deformities with internal fixation screws are again seen involving the 1st and 5th metatarsals. No other osseous abnormality identified. Prominent dorsal midfoot soft tissue swelling is noted. IMPRESSION:  Prominent dorsal midfoot soft tissue swelling. No evidence of acute fracture or dislocation. Electronically Signed   By: Marlaine Hind M.D.   On: 06/29/2022 17:05    Positive ROS: All other systems have been reviewed and were otherwise negative with the exception of those mentioned in the HPI and as above.  Physical Exam: General: Alert, no acute distress. Drowsy but oriented and answers questions appropriately.   MUSCULOSKELETAL:  Right Lower extremity: Right foot/ankle is bandaged with kerlex, but images of her foot in the chart reviewed. Swelling about the foot and toes. She is able to move the ankle some with pain. Sensation intact. Popliteal pulse intact.   Assessment: Right foot cellulitis, secondary to dropping heavy item on the foot ~3 weeks ago 2. Multiple medical comorbidities including alcoholic liver cirrhosis, seizure disorder, AKI   Plan:  Dr. Alvan Dame discussed the case with Dr. Sharol Given who agrees to see the patient in the morning (10/23).   I reviewed this with the patient.   Continue IV antibiotics. Wound changes if needed when soiled. May have diet for now.    Irving Copas, PA-C Cell [phone]   06/30/2022 10:01 AM

## 2022-06-30 NOTE — Progress Notes (Signed)
Subjective:   Patient continues to have significant pain in her right foot.  She continues to deny dyspnea or chest pain.  She also does not have any chills.  Objective:  Vital signs in last 24 hours: Vitals:   06/30/22 0727 06/30/22 1000 06/30/22 1100 06/30/22 1130  BP:  111/76 126/84 113/71  Pulse:  (!) 109  (!) 114  Resp:  19  18  Temp: 98.1 F (36.7 C)     TempSrc: Oral     SpO2:  100%  93%   Physical Exam  Constitutional: well-nourished, and in no distress.  HEENT: Yellowing of conjunctiva  Cardiovascular: Tachycardic rate, regular rhythm, intact distal pulses. No gallop and no friction rub.  No murmur heard.  Pulmonary: Non labored breathing on room air, no wheezing or rales  Abdominal: Soft. Normal bowel sounds. Non tender, distended, + fluid wave  Musculoskeletal: Normal range of motion.        General: No tenderness or edema.  Neurological: Alert and oriented to person, place, and time. Non focal. No asterixis. Able to move R toes  Skin: Skin is warm and dry. Yellowing of the skin diffusely. Purulent drainage from medial and lateral aspect of R foot near the malleoli. Pain with dorsiflexion and plantar flexion of the R foot   Assessment/Plan:  Principal Problem:   Sepsis ([place]) Active Problems:   Acute renal failure superimposed on stage 3a chronic kidney disease (HCC)   Hepatic cirrhosis ([place])   Foot osteomyelitis, right ([place])  [name] is a 51 year old female living with advanced alcoholic liver cirrhosis c/b recurrent ascites and esophageal varices s/p banding, polysubstance use, h/o hepatitis C s/p treatment (recent hep C RNA negative), and seizure disorder on keppra who presented with painful erythematous R foot, found to have R foot osteomyelitis with surrounding cellulitis, myositis, and abscesses in the setting of recent trauma to the foot.    #Sepsis in the setting of R foot osteomyelitis, cellulitis, myositis  Patient noted to have tachycardia,  hypotension, and worsening renal and liver function.  She remains tachycardic this a.m. but her blood pressures have improved with intravenous fluids.  She has not had a fever since being admitted to the hospital.  Her leukocytosis has improved.  Her total bilirubin has improved, however her serum creatinine has worsened. Bcx NG currently.  Orthopedic surgery was consulted for possible intervention of patient's foot.  They recommended continued antibiotics and noted that they would follow-up 10/23 a.m. for further evaluation.  -Continue IV ceftriaxone and vancomycin -Oxycodone and hydromorphone for pain control  -Continue to monitor blood cultures -F/u orthopedic recommendations    #Acute renal failure  Patient with worsening serum creatinine likely secondary to hypovolemia in the setting of poor PO intake over the last few days. Patient did have granular casts in her urinalysis but they were not muddy brown, but she also has a normal specific gravity and has had lower blood pressures prior to fluid resuscitation which point to possible component of ATN. Also, considered hepatorenal syndrome given patient's underlying liver dysfunction.  -Continue fluid resuscitation  -If her serum creatinine remains elevated will consider therapy for HRS   #Decompensated cirrhosis 2/2 to alcohol use c/b esophageal varices  #Treated Hepatitis C (recent Hep C RNA negative) Patient notes that she receives monthly paracenteses. She does have abdominal distention this AM with a positive fluid wave. And her skin is jaundiced and she has icteric conjunctiva.  She has no altered mentation.  Her total bilirubin  is elevated.  INR is 0.4.  Her MELD Na is 26, 19.6% 3 month mortality, Child Pugh Class C.  -Continue to hold nadolol, Coreg, Lasix, spironolactone -Continue to monitor for abdominal discomfort, patient may require small volume paracentesis while inpatient -Continue lactulose 20 g 3 times daily -Continue to  monitor patient's renal function  #History of seizures Patient reports that she has a history of seizures since 51 years old.   #Anxiety  Continue patient's home medications.  No dose adjustments needed for her klonopin in the setting of her renal failure or hepatic dysfunction    #History of thrombocytopenia Patient has received Nplate in the past.  Her platelet counts are normal this hospitalization. -Continue to monitor   Prior to Admission Living Arrangement: Living independently  Anticipated Discharge Location: Home  Barriers to Discharge: None Dispo: Anticipated discharge in approximately 5 day(s).   Rick Duff, MD 06/30/2022, 12:02 PM After 5pm on weekdays and 1pm on weekends: On Call pager [phone]

## 2022-06-30 NOTE — Progress Notes (Signed)
PHARMACY - PHYSICIAN COMMUNICATION CRITICAL VALUE ALERT - BLOOD CULTURE IDENTIFICATION (BCID)  Briana Sweeney is an 51 y.o. female who presented to [facility] on 06/29/2022 with a chief complaint of right foot swelling, found to have R foot osteomyelitis, cellulitis and myositis.  Assessment:  BCx growing MRSA in 1 of 4 bottles thus far.  Afebrile, WBC elevated at 18.3.  Name of physician (or Provider) Contacted: Dr. Eulas Post  Current antibiotics: vancomycin and Rocephin  Changes to prescribed antibiotics recommended:  Recommendations accepted by provider >> continue vanc and stop Rocephin F/u ID consult   Results for orders placed or performed during the hospital encounter of 06/29/22  Blood Culture ID Panel (Reflexed) (Collected: 06/29/2022  3:46 PM)  Result Value Ref Range   Enterococcus faecalis NOT DETECTED NOT DETECTED   Enterococcus Faecium NOT DETECTED NOT DETECTED   Listeria monocytogenes NOT DETECTED NOT DETECTED   Staphylococcus species DETECTED (A) NOT DETECTED   Staphylococcus aureus (BCID) DETECTED (A) NOT DETECTED   Staphylococcus epidermidis NOT DETECTED NOT DETECTED   Staphylococcus lugdunensis NOT DETECTED NOT DETECTED   Streptococcus species NOT DETECTED NOT DETECTED   Streptococcus agalactiae NOT DETECTED NOT DETECTED   Streptococcus pneumoniae NOT DETECTED NOT DETECTED   Streptococcus pyogenes NOT DETECTED NOT DETECTED   A.calcoaceticus-baumannii NOT DETECTED NOT DETECTED   Bacteroides fragilis NOT DETECTED NOT DETECTED   Enterobacterales NOT DETECTED NOT DETECTED   Enterobacter cloacae complex NOT DETECTED NOT DETECTED   Escherichia coli NOT DETECTED NOT DETECTED   Klebsiella aerogenes NOT DETECTED NOT DETECTED   Klebsiella oxytoca NOT DETECTED NOT DETECTED   Klebsiella pneumoniae NOT DETECTED NOT DETECTED   Proteus species NOT DETECTED NOT DETECTED   Salmonella species NOT DETECTED NOT DETECTED   Serratia marcescens NOT DETECTED NOT DETECTED    Haemophilus influenzae NOT DETECTED NOT DETECTED   Neisseria meningitidis NOT DETECTED NOT DETECTED   Pseudomonas aeruginosa NOT DETECTED NOT DETECTED   Stenotrophomonas maltophilia NOT DETECTED NOT DETECTED   Candida albicans NOT DETECTED NOT DETECTED   Candida auris NOT DETECTED NOT DETECTED   Candida glabrata NOT DETECTED NOT DETECTED   Candida krusei NOT DETECTED NOT DETECTED   Candida parapsilosis NOT DETECTED NOT DETECTED   Candida tropicalis NOT DETECTED NOT DETECTED   Cryptococcus neoformans/gattii NOT DETECTED NOT DETECTED   Meth resistant mecA/C and MREJ DETECTED (A) NOT DETECTED    [name] D. Mina Marble, PharmD, BCPS, [place] 06/30/2022, 2:44 PM

## 2022-06-30 NOTE — Plan of Care (Signed)

## 2022-06-30 NOTE — Consult Note (Signed)
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 Sharol Given to see tomorrow and we will need source control, not clear to me that she will be able to salvage limb but she definitely needs infected bone removed  New ID team here tomorrow.  Principal Problem:   Sepsis ([place]) Active Problems:   Acute renal failure superimposed on stage 3a chronic kidney disease (HCC)   Hepatic cirrhosis (HCC)   Foot osteomyelitis, right (HCC)   Scheduled Meds:  enoxaparin (LOVENOX) injection  30 mg Subcutaneous Q24H   FLUoxetine  40 mg Oral Daily   folic acid  1 mg Oral Daily   lactulose  20 g Oral TID   levETIRAcetam  750 mg Oral BID   multivitamin with minerals  1 tablet Oral Daily   pantoprazole  40 mg Oral Daily   thiamine  100 mg Oral Daily   Continuous Infusions:  DAPTOmycin (CUBICIN) 500 mg in sodium chloride 0.9 % IVPB     PRN Meds:.acetaminophen, clonazePAM, HYDROmorphone (DILAUDID) injection, ondansetron (ZOFRAN) IV, oxyCODONE  HPI: Briana Sweeney is a 51 y.o. female with history of alcoholic question hep C related advanced cirrhosis of the liver with esophageal varices status post banding, ascites and history of SBP, hepatitis C status post cure, seizure disorder who is presented to the hospital with painful erythematous right foot.  She claimed that she dropped a crockpot on her  foot in the past 2 weeks and that since then she developed intense worsening pain in the foot with erythema warmth and edema.  She had extension of erythema up to mid shin.  She went to see her primary care physician who prescribed ibuprofen.  He then sent her to [facility] for imaging.  This did not show any fractures.  She had worsening symptoms as mentioned and ultimately sought care with an orthopedic surgeon who then told her to go to the emergency room for admission.  Patient had blood cultures taken on admission.  These of subsequently grown methicillin resistant Staphylococcus aureus.  MRI of the foot has shown extensive osteomyelitis with multiple bones being involved including cuboid medial cuneiform intermediate cuneiform lateral cuneiform and bases of the second through the fifth metatarsals as well as large volume tenosynovitis involving extensor houses longus extensor digitorum tendons in the forefoot with abscesses that are contiguous with soft tissues in the middle and foot and proximal forefoot.  Patient has been seen by orthopedic surgery and clearly is going to need a surgical intervention to allow source control of her bacteremia.  She does not appear to have any other focal complaints at this point in time.  I will order repeat blood cultures and a 2D echocardiogram.  I spent 84 minutes with the patient including than 50% of the time in face to face counseling of the patient regarding her MRSA bacteremia due to  extensive osteomyelitis in her foot, personally reviewing MRI of the foot along with review of medical records in preparation for the visit and during the visit and in coordination of her care.    Review of Systems: Review of Systems  Constitutional:  Positive for fever and malaise/fatigue. Negative for chills, diaphoresis and weight loss.  HENT:  Negative for congestion, hearing loss, sore throat and tinnitus.   Eyes:  Negative for blurred vision and  double vision.  Respiratory:  Negative for cough, sputum production, shortness of breath and wheezing.   Cardiovascular:  Negative for chest pain, palpitations and leg swelling.  Gastrointestinal:  Positive for abdominal pain. Negative for blood in stool, constipation, diarrhea, heartburn, melena, nausea and vomiting.  Genitourinary:  Negative for dysuria, flank pain and hematuria.  Musculoskeletal:  Positive for myalgias. Negative for back pain, falls and joint pain.  Skin:  Negative for itching and rash.  Neurological:  Positive for weakness. Negative for dizziness, sensory change, focal weakness, loss of consciousness and headaches.  Endo/Heme/Allergies:  Does not bruise/bleed easily.  Psychiatric/Behavioral:  Positive for depression. Negative for memory loss and suicidal ideas. The patient is not nervous/anxious.     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)    Due to alcohol and hepatitis C   Cocaine dependence without complication ([place]) 13/04/6577   Decompensated hepatic cirrhosis ([place]) 07/23/2017   Depression with anxiety    Epistaxis 01/20/2019   Erysipelas 09/13/2018   Esophageal 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 ([place])    Esophageal varices without bleeding (HCC)    ETOHism (HCC)    GERD (gastroesophageal reflux disease)    Head injury    Hematemesis 02/10/2018   Hepatitis C 2018    hepatitis c and alcohol related hepatitis   Heroin abuse ([place])    History of blood transfusion    "blood doesn't clot; I fell down and had to have a transfusion"   History of kidney stones    Menopause 2016   Migraine    "when I get really stressed" (09/01/2017)   Neuropathy 10/27/2018   Olecranon bursitis of left elbow    Other mixed anxiety disorders 10/27/2018   Overdose 08/22/2018   Pancytopenia ([place]) 07/23/2017   Polysubstance abuse ([place]) 04/08/2018   Portal hypertension ([place]) 03/18/2018   S/P foot surgery, right 11/2019   Schizophrenia ([place])    Seizures ([place])    "when I run out of my RX; lots recently" (09/01/2017)   Suicidal ideation    Thrombocytopenia ([place])    Trichimoniasis 10/30/2017   Upper GI bleed 02/10/2018   Urinary urgency 05/2020   UTI (urinary tract infection) 06/02/2017    Social History   Tobacco Use   Smoking status: Never   Smokeless tobacco: Never  Vaping Use   Vaping Use: Never used  Substance Use Topics   Alcohol use: Yes    Alcohol/week: 6.0 standard drinks of alcohol  Types: 6 Cans of beer per week    Comment: pt states she had 1 can beer on 02/26/22   Drug use: Not Currently    Family History  Problem Relation Age of Onset   Lung cancer Mother 58   Alcohol abuse Mother    Throat cancer Father 36   Allergies  Allergen Reactions   Other Other (See Comments)    Platelets: Rx chest pain, tremors, body aches  Patient stated that she receives platelet injection every Wednesday     OBJECTIVE: Blood pressure 109/78, pulse (!) 113, temperature 99 F (37.2 C), temperature source Oral, resp. rate 18, height '4\' 11"'$  (1.499 m), weight 66.1 kg, SpO2 96 %.  Physical Exam Constitutional:      General: She is not in acute distress.    Appearance: Normal appearance. She is well-developed. She is obese. She is ill-appearing. She is not diaphoretic.  HENT:     Head: Normocephalic and atraumatic.     Right Ear: Hearing and external ear normal.      Left Ear: Hearing and external ear normal.     Nose: No nasal deformity or rhinorrhea.  Eyes:     General: Scleral icterus present.     Conjunctiva/sclera: Conjunctivae normal.     Right eye: Right conjunctiva is not injected.     Left eye: Left conjunctiva is not injected.     Pupils: Pupils are equal, round, and reactive to light.  Neck:     Vascular: No JVD.  Cardiovascular:     Rate and Rhythm: Regular rhythm. Tachycardia present.     Heart sounds: Normal heart sounds, S1 normal and S2 normal. No murmur heard.    No friction rub. No gallop.  Pulmonary:     Effort: No respiratory distress.     Breath sounds: No stridor. No wheezing or rhonchi.  Abdominal:     General: Bowel sounds are normal. There is distension.     Palpations: Abdomen is soft.     Tenderness: There is no abdominal tenderness.  Musculoskeletal:        General: Normal range of motion.     Right shoulder: Normal.     Left shoulder: Normal.     Cervical back: Normal range of motion and neck supple.     Right hip: Normal.     Left hip: Normal.     Right knee: Normal.     Left knee: Normal.  Lymphadenopathy:     Head:     Right side of head: No submandibular, preauricular or posterior auricular adenopathy.     Left side of head: No submandibular, preauricular or posterior auricular adenopathy.     Cervical: No cervical adenopathy.     Right cervical: No superficial or deep cervical adenopathy.    Left cervical: No superficial or deep cervical adenopathy.  Skin:    General: Skin is warm and dry.     Coloration: Skin is jaundiced. Skin is not pale.     Findings: No abrasion, bruising, ecchymosis, erythema, lesion or rash.     Nails: There is no clubbing.  Neurological:     Mental Status: She is alert and oriented to person, place, and time.     Sensory: No sensory deficit.     Coordination: Coordination normal.     Gait: Gait normal.  Psychiatric:        Attention and Perception: Attention normal.  She is attentive.        Mood and  Affect: Mood is depressed.        Speech: Speech normal.        Behavior: Behavior normal. Behavior is cooperative.        Thought Content: Thought content normal.        Cognition and Memory: Cognition normal.        Judgment: Judgment normal.     Lab Results Lab Results  Component Value Date   WBC 18.3 (H) 06/30/2022   HGB 12.4 06/30/2022   HCT 41.7 06/30/2022   MCV 92.7 06/30/2022   PLT 208 06/30/2022    Lab Results  Component Value Date   CREATININE 2.12 (H) 06/30/2022   BUN 32 (H) 06/30/2022   NA 134 (L) 06/30/2022   K 4.9 06/30/2022   CL 103 06/30/2022   CO2 19 (L) 06/30/2022    Lab Results  Component Value Date   ALT 20 06/30/2022   AST 26 06/30/2022   GGT 387 (H) 12/10/2018   ALKPHOS 141 (H) 06/30/2022   BILITOT 6.8 (H) 06/30/2022     Microbiology: Recent Results (from the past 240 hour(s))  Blood culture (routine x 2)     Status: None (Preliminary result)   Collection Time: 06/29/22  3:46 PM   Specimen: BLOOD RIGHT FOREARM  Result Value Ref Range Status   Specimen Description BLOOD RIGHT FOREARM  Final   Special Requests   Final    BOTTLES DRAWN AEROBIC AND ANAEROBIC Blood Culture adequate volume   Culture  Setup Time   Final    GRAM POSITIVE COCCI IN CLUSTERS AEROBIC BOTTLE ONLY CRITICAL RESULT CALLED TO, READ BACK BY AND VERIFIED WITH: PHARMD T DANG [id number] AT 1437 BY CM Performed at [facility], [place] [address], [place], [place] [address]    Culture GRAM POSITIVE COCCI  Final   Report Status PENDING  Incomplete  Blood Culture ID Panel (Reflexed)     Status: Abnormal   Collection Time: 06/29/22  3:46 PM  Result Value Ref Range Status   Enterococcus faecalis NOT DETECTED NOT DETECTED Final   Enterococcus Faecium NOT DETECTED NOT DETECTED Final   Listeria monocytogenes NOT DETECTED NOT DETECTED Final   Staphylococcus species DETECTED (A) NOT DETECTED Final    Comment: CRITICAL RESULT CALLED TO, READ BACK BY  AND VERIFIED WITH: PHARMD T DANG [id number] AT 1437 BY CM    Staphylococcus aureus (BCID) DETECTED (A) NOT DETECTED Final    Comment: Methicillin (oxacillin)-resistant Staphylococcus aureus (MRSA). MRSA is predictably resistant to beta-lactam antibiotics (except ceftaroline). Preferred therapy is vancomycin unless clinically contraindicated. Patient requires contact precautions if  hospitalized. CRITICAL RESULT CALLED TO, READ BACK BY AND VERIFIED WITH: PHARMD T DANG [id number] AT 6301 BY CM    Staphylococcus epidermidis NOT DETECTED NOT DETECTED Final   Staphylococcus lugdunensis NOT DETECTED NOT DETECTED Final   Streptococcus species NOT DETECTED NOT DETECTED Final   Streptococcus agalactiae NOT DETECTED NOT DETECTED Final   Streptococcus pneumoniae NOT DETECTED NOT DETECTED Final   Streptococcus pyogenes NOT DETECTED NOT DETECTED Final   A.calcoaceticus-baumannii NOT DETECTED NOT DETECTED Final   Bacteroides fragilis NOT DETECTED NOT DETECTED Final   Enterobacterales NOT DETECTED NOT DETECTED Final   Enterobacter cloacae complex NOT DETECTED NOT DETECTED Final   Escherichia coli NOT DETECTED NOT DETECTED Final   Klebsiella aerogenes NOT DETECTED NOT DETECTED Final   Klebsiella oxytoca NOT DETECTED NOT DETECTED Final   Klebsiella pneumoniae NOT DETECTED NOT DETECTED Final   Proteus species NOT DETECTED NOT DETECTED  Final   Salmonella species NOT DETECTED NOT DETECTED Final   Serratia marcescens NOT DETECTED NOT DETECTED Final   Haemophilus influenzae NOT DETECTED NOT DETECTED Final   Neisseria meningitidis NOT DETECTED NOT DETECTED Final   Pseudomonas aeruginosa NOT DETECTED NOT DETECTED Final   Stenotrophomonas maltophilia NOT DETECTED NOT DETECTED Final   Candida albicans NOT DETECTED NOT DETECTED Final   Candida auris NOT DETECTED NOT DETECTED Final   Candida glabrata NOT DETECTED NOT DETECTED Final   Candida krusei NOT DETECTED NOT DETECTED Final   Candida parapsilosis NOT DETECTED  NOT DETECTED Final   Candida tropicalis NOT DETECTED NOT DETECTED Final   Cryptococcus neoformans/gattii NOT DETECTED NOT DETECTED Final   Meth resistant mecA/C and MREJ DETECTED (A) NOT DETECTED Final    Comment: CRITICAL RESULT CALLED TO, READ BACK BY AND VERIFIED WITH: PHARMD T DANG [id number] AT 1437 BY CM Performed at [facility], [place] [address], [place], [place] [address]   Blood culture (routine x 2)     Status: None (Preliminary result)   Collection Time: 06/29/22  4:00 PM   Specimen: BLOOD  Result Value Ref Range Status   Specimen Description BLOOD RIGHT ANTECUBITAL  Final   Special Requests   Final    BOTTLES DRAWN AEROBIC AND ANAEROBIC Blood Culture adequate volume   Culture   Final    NO GROWTH < 24 HOURS Performed at [facility], [place] [address], [place], [place] [address]    Report Status PENDING  Incomplete    Alcide Evener, [place] [facility] [phone] pager  06/30/2022, 4:12 PM

## 2022-06-30 NOTE — Progress Notes (Signed)
Dr. Eulas Post made aware of most recent VS, Lactic Acid and blood glucose of 74, see new orders, crackers and 4 oz of juice provided, pt. Tol. Well, blood glucose increasing to 78, LR bolus started and cardiac monitoring initiated per orders, will cont. To monitor  Anastasio Auerbach

## 2022-06-30 NOTE — Progress Notes (Signed)
Pharmacy Antibiotic Note  Briana Sweeney is a 51 y.o. female admitted on 06/29/2022 with  R ankle injury and concern for infection .  Blood cx (1 of 2) obtained on admission with MRSA.  Patient was initially on Vancomycin, last dose 10/21 at 9pm.  In the setting of AKI with SCr up 2.2 (baseline <1) pharmacy has been consulted for Daptomycin dosing.  Plan: Daptomycin '500mg'$  (~'8mg'$ /kg for bacteremia) IV q48 hours.   Follow-up ID and ortho recs. CK ordered for AM labs.  Height: '4\' 11"'$  (149.9 cm) Weight: 66.1 kg (145 lb 11.6 oz) IBW/kg (Calculated) : 43.2  Temp (24hrs), Avg:98 F (36.7 C), Min:97.1 F (36.2 C), Max:99 F (37.2 C)  Recent Labs  Lab 06/26/22 0000 06/29/22 1540 06/29/22 2305 06/30/22 0311  WBC 17.3 19.3*  --  18.3*  CREATININE  --  1.68*  --  2.12*  LATICACIDVEN  --  2.1* 1.7  --     Estimated Creatinine Clearance: 26.3 mL/min (A) (by C-G formula based on SCr of 2.12 mg/dL (H)).    Allergies  Allergen Reactions   Other Other (See Comments)    Platelets: Rx chest pain, tremors, body aches  Patient stated that she receives platelet injection every Wednesday     Antimicrobials this admission: Rocephin 10/21 >> 10/22 Vanc  10/21 >> 10/22 Daptomycin 10/22 >>  Dose adjustments this admission:   Microbiology results: 10/21 BCx: 1/2 GCP, BCID with MRSA   Thank you for allowing pharmacy to be a part of this patient's care.  Elmer Ramp 06/30/2022 3:59 PM

## 2022-07-01 ENCOUNTER — Inpatient Hospital Stay (HOSPITAL_COMMUNITY): Payer: No Typology Code available for payment source

## 2022-07-01 DIAGNOSIS — R7881 Bacteremia: Secondary | ICD-10-CM

## 2022-07-01 DIAGNOSIS — M86471 Chronic osteomyelitis with draining sinus, right ankle and foot: Secondary | ICD-10-CM | POA: Diagnosis not present

## 2022-07-01 DIAGNOSIS — K7031 Alcoholic cirrhosis of liver with ascites: Secondary | ICD-10-CM | POA: Diagnosis not present

## 2022-07-01 DIAGNOSIS — B9562 Methicillin resistant Staphylococcus aureus infection as the cause of diseases classified elsewhere: Secondary | ICD-10-CM

## 2022-07-01 DIAGNOSIS — N1831 Chronic kidney disease, stage 3a: Secondary | ICD-10-CM

## 2022-07-01 DIAGNOSIS — N179 Acute kidney failure, unspecified: Secondary | ICD-10-CM | POA: Diagnosis not present

## 2022-07-01 DIAGNOSIS — A419 Sepsis, unspecified organism: Secondary | ICD-10-CM | POA: Diagnosis not present

## 2022-07-01 DIAGNOSIS — M60073 Infective myositis, right foot: Secondary | ICD-10-CM | POA: Diagnosis not present

## 2022-07-01 DIAGNOSIS — M86671 Other chronic osteomyelitis, right ankle and foot: Secondary | ICD-10-CM

## 2022-07-01 DIAGNOSIS — E43 Unspecified severe protein-calorie malnutrition: Secondary | ICD-10-CM

## 2022-07-01 DIAGNOSIS — R6520 Severe sepsis without septic shock: Secondary | ICD-10-CM

## 2022-07-01 DIAGNOSIS — L03115 Cellulitis of right lower limb: Secondary | ICD-10-CM | POA: Diagnosis not present

## 2022-07-01 LAB — CBC
HCT: 35.4 % — ABNORMAL LOW (ref 36.0–46.0)
Hemoglobin: 11.2 g/dL — ABNORMAL LOW (ref 12.0–15.0)
MCH: 28.1 pg (ref 26.0–34.0)
MCHC: 31.6 g/dL (ref 30.0–36.0)
MCV: 88.9 fL (ref 80.0–100.0)
Platelets: 183 10*3/uL (ref 150–400)
RBC: 3.98 MIL/uL (ref 3.87–5.11)
WBC: 14.5 10*3/uL — ABNORMAL HIGH (ref 4.0–10.5)
nRBC: 0 % (ref 0.0–0.2)

## 2022-07-01 LAB — COMPREHENSIVE METABOLIC PANEL
ALT: 22 U/L (ref 0–44)
AST: 29 U/L (ref 15–41)
Albumin: 1.6 g/dL — ABNORMAL LOW (ref 3.5–5.0)
Alkaline Phosphatase: 164 U/L — ABNORMAL HIGH (ref 38–126)
Anion gap: 12 (ref 5–15)
BUN: 21 mg/dL — ABNORMAL HIGH (ref 6–20)
CO2: 22 mmol/L (ref 22–32)
Calcium: 8.2 mg/dL — ABNORMAL LOW (ref 8.9–10.3)
Chloride: 99 mmol/L (ref 98–111)
Creatinine, Ser: 1.11 mg/dL — ABNORMAL HIGH (ref 0.44–1.00)
GFR, Estimated: >60 mL/min (ref >60)
Glucose, Bld: 119 mg/dL — ABNORMAL HIGH (ref 70–99)
Potassium: 4.3 mmol/L (ref 3.5–5.1)
Sodium: 133 mmol/L — ABNORMAL LOW (ref 135–145)
Total Bilirubin: 7.1 mg/dL — ABNORMAL HIGH (ref 0.3–1.2)
Total Protein: 4.9 g/dL — ABNORMAL LOW (ref 6.5–8.1)

## 2022-07-01 LAB — GLUCOSE, CAPILLARY
Glucose-Capillary: 113 mg/dL — ABNORMAL HIGH (ref 70–99)
Glucose-Capillary: 127 mg/dL — ABNORMAL HIGH (ref 70–99)
Glucose-Capillary: 138 mg/dL — ABNORMAL HIGH (ref 70–99)

## 2022-07-01 LAB — ECHOCARDIOGRAM COMPLETE
Height: 59 in
S' Lateral: 2.3 cm
Weight: 2398.6 oz

## 2022-07-01 LAB — CK: Total CK: 15 U/L — ABNORMAL LOW (ref 38–234)

## 2022-07-01 MED ORDER — ENOXAPARIN SODIUM 40 MG/0.4ML IJ SOSY
40.0000 mg | PREFILLED_SYRINGE | INTRAMUSCULAR | Status: DC
  Administered 2022-07-02 – 2022-07-04 (×3): 40 mg via SUBCUTANEOUS
  Filled 2022-07-01 (×4): qty 0.4

## 2022-07-01 MED ORDER — CEFAZOLIN SODIUM-DEXTROSE 2-4 GM/100ML-% IV SOLN
2.0000 g | INTRAVENOUS | Status: AC
  Administered 2022-07-02: 2 g via INTRAVENOUS
  Filled 2022-07-01: qty 100

## 2022-07-01 MED ORDER — BANATROL TF EN LIQD
60.0000 mL | Freq: Two times a day (BID) | ENTERAL | Status: DC
  Administered 2022-07-01 – 2022-07-05 (×7): 60 mL via ORAL
  Filled 2022-07-01 (×9): qty 60

## 2022-07-01 MED ORDER — CHLORHEXIDINE GLUCONATE 4 % EX LIQD
60.0000 mL | Freq: Once | CUTANEOUS | Status: AC
  Administered 2022-07-01: 4 via TOPICAL
  Filled 2022-07-01: qty 60

## 2022-07-01 MED ORDER — TRANEXAMIC ACID-NACL 1000-0.7 MG/100ML-% IV SOLN
1000.0000 mg | INTRAVENOUS | Status: AC
  Administered 2022-07-02: 1000 mg via INTRAVENOUS
  Filled 2022-07-01: qty 100

## 2022-07-01 MED ORDER — LACTATED RINGERS IV SOLN: INTRAVENOUS | Status: AC

## 2022-07-01 MED ORDER — SODIUM CHLORIDE 0.9 % IV SOLN
500.0000 mg | Freq: Every day | INTRAVENOUS | Status: DC
  Administered 2022-07-01 – 2022-07-04 (×4): 500 mg via INTRAVENOUS
  Filled 2022-07-01 (×5): qty 10

## 2022-07-01 MED ORDER — ENSURE ENLIVE PO LIQD
237.0000 mL | Freq: Two times a day (BID) | ORAL | Status: DC
  Administered 2022-07-01 – 2022-07-05 (×7): 237 mL via ORAL

## 2022-07-01 MED ORDER — POVIDONE-IODINE 10 % EX SWAB
2.0000 | Freq: Once | CUTANEOUS | Status: AC
  Administered 2022-07-01: 2 via TOPICAL

## 2022-07-01 NOTE — Progress Notes (Signed)
Subjective:   Patient requests monitoring for her anxiety.  She states she takes this daily at home.  She provided her sister's phone number but was unable to reach today.  Objective:  Vital signs in last 24 hours: Vitals:   07/01/22 0435 07/01/22 0500 07/01/22 0900 07/01/22 1130  BP: 124/68  104/74 110/73  Pulse: (!) 125  (!) 120 (!) 120  Resp: '19  20 19  '$ Temp: 98.4 F (36.9 C)  98 F (36.7 C) 98.4 F (36.9 C)  TempSrc:   Oral Oral  SpO2: 92%  90% 93%  Weight:  68 kg    Height:       Physical Exam  Constitutional: well-nourished, and in no distress.  HEENT: Scleral icterus Pulmonary: Non labored breathing on room air Neurological: Alert and oriented to person, place, and time. Non focal. Skin: Skin is warm and dry. Yellowing of the skin diffusely. Purulent drainage from medial and lateral aspect of R foot near the malleoli.  Neuro: Alert and oriented x3     Latest Ref Rng & Units 07/01/2022    3:27 AM 06/30/2022    3:11 AM 06/29/2022    3:40 PM  CBC  WBC 4.0 - 10.5 K/uL 14.5  18.3  19.3   Hemoglobin 12.0 - 15.0 g/dL 11.2  12.4  14.7   Hematocrit 36.0 - 46.0 % 35.4  41.7  47.2   Platelets 150 - 400 K/uL 183  208  298       Latest Ref Rng & Units 07/01/2022    3:27 AM 06/30/2022    4:33 PM 06/30/2022    3:11 AM  CMP  Glucose 70 - 99 mg/dL 119  99  54   BUN 6 - 20 mg/dL 21  34  32   Creatinine 0.44 - 1.00 mg/dL 1.11  1.66  2.12   Sodium 135 - 145 mmol/L 133  132  134   Potassium 3.5 - 5.1 mmol/L 4.3  4.6  4.9   Chloride 98 - 111 mmol/L 99  100  103   CO2 22 - 32 mmol/L '22  20  19   '$ Calcium 8.9 - 10.3 mg/dL 8.2  8.0  8.3   Total Protein 6.5 - 8.1 g/dL 4.9   5.3   Total Bilirubin 0.3 - 1.2 mg/dL 7.1   6.8   Alkaline Phos 38 - 126 U/L 164   141   AST 15 - 41 U/L 29   26   ALT 0 - 44 U/L 22   20    Assessment/Plan:  Principal Problem:   Sepsis ([place]) Active Problems:   Acute renal failure superimposed on stage 3a chronic kidney disease (HCC)   Severe  protein-calorie malnutrition (HCC)   Cellulitis of right foot   Hepatic cirrhosis (HCC)   Foot osteomyelitis, right ([place])  Ms. Kriss Perleberg is a 51 year old female living with advanced alcoholic liver cirrhosis c/b recurrent ascites and esophageal varices s/p banding, polysubstance use, h/o hepatitis C s/p treatment (recent hep C RNA negative), and seizure disorder on keppra who presented with painful erythematous R foot, found to have R foot osteomyelitis with surrounding cellulitis, myositis, and abscesses in the setting of recent trauma to the foot.   #R foot osteomyelitis, cellulitis, myositis #MRSA bacteremia #Sepsis, resolved Tachycardia continues but normotensive today. Continues to be afebrile. Her leukocytosis is improving.  T bili stable, sCr improved. Blood cultures with GPC, sensitivities pending. Orthopedic surgery planning for right transtibial amputation tomorrow or Wednesday. -Infectious  disease following, switched to daptomycin -Oxycodone and hydromorphone for pain control  -Daily CBC  #Acute renal failure  sCr improved today. GFR >60. Patient did have granular casts on UA.  Possible ATN in the setting of hypovolemia and/or hepatorenal syndrome given patient's underlying liver dysfunction.  -IVF as needed  #Decompensated cirrhosis 2/2 to alcohol use c/b esophageal varices  #Treated Hepatitis C (recent Hep C RNA negative) Patient notes that she receives monthly paracenteses. Skin is jaundiced, sclera icteric.  Baseline mentation.  T bili stable.  INR 1.4.  Her MELD Na is 26, 19.6% 3 month mortality, Child Pugh Class C.  -Continue to hold nadolol, Coreg, Lasix, spironolactone -Continue to monitor for abdominal discomfort, patient may require small volume paracentesis while inpatient -Continue lactulose 20 g 3 times daily -Continue to monitor patient's renal function  #History of seizures Patient reports that she has a history of seizures since 51 years old.   #Anxiety   Continue patient's home medications.  No dose adjustments needed for her klonopin in the setting of her renal failure or hepatic dysfunction    #History of thrombocytopenia Patient has received Nplate in the past.  Her platelet counts are normal this hospitalization. -Continue to monitor   Prior to Admission Living Arrangement: Living independently  Anticipated Discharge Location: Home  Barriers to Discharge: None Dispo: Anticipated discharge in approximately 5 day(s).   Linward Natal, MD 07/01/2022, 12:14 PM After 5pm on weekdays and 1pm on weekends: On Call pager [phone]

## 2022-07-01 NOTE — H&P (View-Only) (Signed)
ORTHOPAEDIC CONSULTATION  REQUESTING PHYSICIAN: Axel Filler, *  Chief Complaint: Pain and swelling right foot.  HPI: Briana Sweeney is a 51 y.o. female who presents with chronic osteomyelitis and abscess right foot.   Patient states that about 3 weeks ago she had blunt trauma to the right foot.  Patient presented to [facility] urgent care on October 5 and 6.  Patient states she was told she had bruising from the blunt trauma.   Past Medical History:  Diagnosis Date   Abscess of bursa of left elbow 09/23/2018   Acute hyperactive alcohol withdrawal delirium ([place]) 10/09/2017   Acute low back pain with bilateral sciatica    Acute metabolic encephalopathy 00/92/3300   Acute on chronic pancreatitis ([place]) 04/04/2021   AKI (acute kidney injury) ([place]) 04/04/2021   Alcohol withdrawal syndrome with complication ([place]) 76/22/6333   Alcohol-induced acute pancreatitis    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[place]) 12/05/2018   Ascites due to alcoholic cirrhosis ([place])    Bipolar affective disorder ([place])    With anxiety features   Bunionette of right foot 11/2019   Cirrhosis of liver ([place])    Due to alcohol and hepatitis C   Cocaine dependence without complication ([place]) 54/56/2563   Decompensated hepatic cirrhosis ([place]) 07/23/2017   Depression with anxiety    Epistaxis 01/20/2019   Erysipelas 09/13/2018   Esophageal 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 ([place])    Esophageal varices without bleeding (HCC)    ETOHism (HCC)    GERD (gastroesophageal reflux disease)    Head injury    Hematemesis 02/10/2018   Hepatitis C 2018   hepatitis c and alcohol related hepatitis   Heroin abuse ([place])    History of blood transfusion    "blood doesn't clot; I fell down and had to have a transfusion"   History of kidney stones    Menopause 2016   Migraine    "when I get really stressed" (09/01/2017)   Neuropathy 10/27/2018   Olecranon bursitis of left elbow    Other mixed anxiety disorders 10/27/2018    Overdose 08/22/2018   Pancytopenia ([place]) 07/23/2017   Polysubstance abuse ([place]) 04/08/2018   Portal hypertension ([place]) 03/18/2018   S/P foot surgery, right 11/2019   Schizophrenia ([place])    Seizures ([place])    "when I run out of my RX; lots recently" (09/01/2017)   Suicidal ideation    Thrombocytopenia ([place])    Trichimoniasis 10/30/2017   Upper GI bleed 02/10/2018   Urinary urgency 05/2020   UTI (urinary tract infection) 06/02/2017   Past Surgical History:  Procedure Laterality Date   BUNIONECTOMY     ESOPHAGEAL BANDING  06/26/2021   Procedure: ESOPHAGEAL BANDING;  Surgeon: Jackquline Denmark, MD;  Location: [facility] ENDOSCOPY;  Service: Endoscopy;;   ESOPHAGOGASTRODUODENOSCOPY N/A 09/03/2017   Procedure: ESOPHAGOGASTRODUODENOSCOPY (EGD);  Surgeon: Doran Stabler, MD;  Location: [facility];  Service: Gastroenterology;  Laterality: N/A;   ESOPHAGOGASTRODUODENOSCOPY N/A 02/01/2022   Procedure: ESOPHAGOGASTRODUODENOSCOPY (EGD);  Surgeon: Clarene Essex, MD;  Location: [facility] ENDOSCOPY;  Service: Gastroenterology;  Laterality: N/A;   ESOPHAGOGASTRODUODENOSCOPY (EGD) WITH PROPOFOL N/A 06/26/2021   Procedure: ESOPHAGOGASTRODUODENOSCOPY (EGD) WITH PROPOFOL;  Surgeon: Jackquline Denmark, MD;  Location: [facility] ENDOSCOPY;  Service: Endoscopy;  Laterality: N/A;   FINGER FRACTURE SURGERY Left    "shattered my pinky"   FRACTURE SURGERY     I & D EXTREMITY Left 09/18/2018   Procedure: IRRIGATION AND DEBRIDEMENT EXTREMITY;  Surgeon: Leanora Cover, MD;  Location: [facility];  Service: Orthopedics;  Laterality: Left;  IR PARACENTESIS  07/23/2017   IR PARACENTESIS  07/2017   "did it twice in the same week" (09/01/2017)   IR PARACENTESIS  06/26/2021   SHOULDER OPEN ROTATOR CUFF REPAIR Right    TOOTH EXTRACTION  08/2019   TUBAL LIGATION     VAGINAL HYSTERECTOMY     Social History   Socioeconomic History   Marital status: Divorced    Spouse name: Not on file   Number of children: 2   Years of education: Not on file    Highest education level: Not on file  Occupational History   Occupation: applying for disability  Tobacco Use   Smoking status: Never   Smokeless tobacco: Never  Vaping Use   Vaping Use: Never used  Substance and Sexual Activity   Alcohol use: Yes    Alcohol/week: 6.0 standard drinks of alcohol    Types: 6 Cans of beer per week    Comment: pt states she had 1 can beer on 02/26/22   Drug use: Not Currently   Sexual activity: Not on file  Other Topics Concern   Not on file  Social History Narrative   She moved with a boyfriend to [place] and was followed at [facility].  He died of a massive heart attack in 8/18, per her report, and so she moved back to [place] and is living with a friend.      Right handed   2 story home    Lives alone 11/09/19   Social Determinants of Health   Financial Resource Strain: Not on file  Food Insecurity: No Food Insecurity (05/14/2022)   Hunger Vital Sign    Worried About Running Out of Food in the Last Year: Never true    Ran Out of Food in the Last Year: Never true  Transportation Needs: No Transportation Needs (05/14/2022)   PRAPARE - Hydrologist (Medical): No    Lack of Transportation (Non-Medical): No  Physical Activity: Not on file  Stress: Not on file  Social Connections: Not on file   Family History  Problem Relation Age of Onset   Lung cancer Mother 78   Alcohol abuse Mother    Throat cancer Father 75   - negative except otherwise stated in the family history section Allergies  Allergen Reactions   Other Other (See Comments)    Platelets: Rx chest pain, tremors, body aches  Patient stated that she receives platelet injection every Wednesday    Prior to Admission medications   Medication Sig Start Date End Date Taking? Authorizing Provider  acamprosate (CAMPRAL) 333 MG tablet Take 2 tablets (666 mg total) by mouth 3 (three) times daily Patient not taking: Reported on 05/13/2022 04/09/22      amoxicillin-clavulanate (AUGMENTIN) 875-125 MG tablet Take 1 tablet by mouth 2 (two) times daily. 05/17/22     ARIPiprazole (ABILIFY) 10 MG tablet Take 1 tablet by mouth every day Patient taking differently: Take 10 mg by mouth every morning. 11/22/21     atovaquone (MEPRON) 750 MG/5ML suspension Take by mouth. 04/09/22   [provider]  clonazePAM (KLONOPIN) 0.5 MG tablet TAKE 1 TABLET BY MOUTH TWICE DAILY Patient taking differently: Take 0.5 mg by mouth 2 (two) times daily as needed for anxiety. 02/14/22     clonazePAM (KLONOPIN) 0.5 MG tablet Take by mouth. 08/30/21   [provider]  diclofenac Sodium (VOLTAREN) 1 % GEL Apply 2 g topically daily. Apply to hands    [provider]  FLUoxetine (PROZAC) 40 MG capsule Take 1 capsule (40 mg total) by mouth daily. Patient taking differently: Take 40 mg by mouth every morning. 11/22/21     FLUoxetine (PROZAC) 40 MG capsule Take 1 capsule by mouth daily. 08/30/21   [provider]  folic acid (FOLVITE) 1 MG tablet Take by mouth. 10/30/21 10/30/22  [provider]  furosemide (LASIX) 40 MG tablet Take 40 mg by mouth daily.    [provider]  ibuprofen (ADVIL) 800 MG tablet Take 800 mg by mouth 3 (three) times daily as needed. 06/17/22   [provider]  lactulose (CHRONULAC) 10 GM/15ML solution Take 30 mLs (20 g total) by mouth 3 (three) times daily. 01/24/22   Aline August, MD  lactulose, encephalopathy, ([place]) 10 GM/15ML SOLN Take by mouth. 06/25/22 06/25/23  [provider]  levETIRAcetam (KEPPRA) 750 MG tablet Take 1 tablet (750 mg total) by mouth 2 (two) times daily. 07/06/21   Cameron Sprang, MD  Multiple Vitamin [place]) TABS Take 1 tablet by mouth every morning. 10/30/21 10/30/22  [provider]  Multiple Vitamins-Minerals (MULTIVITAMIN WITH MINERALS) tablet Take 1 tablet by mouth daily. 03/12/22 03/12/23  Aline August, MD  nadolol (CORGARD) 20 MG tablet Take 1  tablet (20 mg total) by mouth every morning. 05/15/22 06/15/22  Dessa Phi, DO  OVER THE COUNTER MEDICATION Take 1 capsule by mouth daily. OLLY Ultra brain soft gel    [provider]  pantoprazole (PROTONIX) 40 MG tablet Take 40 mg by mouth daily.    [provider]  potassium chloride (KLOR-CON) 20 MEQ packet Take 1 packet (20 mEq) by mouth daily as directed 05/15/22   Dessa Phi, DO  predniSONE (DELTASONE) 50 MG tablet Take by mouth.    [provider]  romiPLOStim (NPLATE) 125 MCG injection Inject into the skin. Patient not taking: Reported on 05/14/2022 04/09/22   [provider]  spironolactone (ALDACTONE) 50 MG tablet Take 100 mg by mouth daily. 06/30/21   [provider]  thiamine 100 MG tablet Take 1 tablet (100 mg total) by mouth daily. 03/13/22   Aline August, MD   MR FOOT RIGHT W WO CONTRAST  Result Date: 06/30/2022 CLINICAL DATA:  Foot swelling, nondiabetic, osteomyelitis suspected EXAM: MRI OF THE RIGHT FOREFOOT WITHOUT AND WITH CONTRAST TECHNIQUE: Multiplanar, multisequence MR imaging of the right forefoot was performed before and after the administration of intravenous contrast. CONTRAST:  6.81m GADAVIST GADOBUTROL 1 MMOL/ML IV SOLN COMPARISON:  X-ray 06/29/2022 FINDINGS: Bones/Joint/Cartilage Intense bone marrow edema with enhancement involving multiple bones within the midfoot including the cuboid, medial cuneiform, intermediate cuneiform, lateral cuneiform as well as the bases of the second through fifth metatarsals. Appearance is most compatible with acute osteomyelitis. There are small effusions throughout the midfoot and across the TMT joints concerning for septic arthritis. Nondisplaced fractures at these locations are not excluded. Mild marrow edema within the navicular. Susceptibility artifact from hardware within the first and fifth metatarsals. No dislocations. Ligaments Lisfranc ligament is thickened and heterogeneous, which could  reflect sequela of prior trauma or reactive changes from local inflammation. Intact collateral ligaments. Muscles and Tendons Large volume tenosynovitis involving the extensor hallucis longus tendon and extensor digitorum tendons within the forefoot and midfoot. These tenosynovial fluid collections appear contiguous with adjacent rim enhancing abscesses within the soft tissues of the midfoot and proximal forefoot. Focal dorsal midfoot collection predominantly involving the extensor digitorum tendons measures approximately 4.0 x 1.7 x 4.0 cm (series 4,  image 10). More medially located dorsal midfoot collection measures approximately 4.0 x 2.0 x 2.5 cm (series 4, image 4). More laterally located collection within the midfoot measures approximately 2.7 x 1.3 x 2.7 cm (series 9, image 5). Focal tenosynovitis at the knot of Henry within the plantar midfoot. Diffuse edema-like signal throughout the foot musculature. Soft tissues Marked soft tissue swelling of the forefoot and midfoot. Wound or ulceration at the dorso lateral aspect of the midfoot. IMPRESSION: 1. Findings are most compatible with extensive osteomyelitis throughout the bones of the midfoot and the bases of the second through fifth metatarsals. Small joint effusions throughout the midfoot and within the TMT joints is concerning for septic arthritis. 2. Large volume tenosynovitis involving the extensor hallucis longus tendon and extensor digitorum tendons within the forefoot and midfoot. These tenosynovial fluid collections appear contiguous with multiple adjacent rim enhancing abscesses within the soft tissues of the midfoot and proximal forefoot, as described. 3. Findings of diffuse cellulitis and myositis of the right foot. Electronically Signed   By: Davina Poke D.O.   On: 06/30/2022 09:39   VAS [place] LOWER EXTREMITY VENOUS (DVT) (ONLY [facility] & [facility])  Result Date: 06/30/2022  Lower Venous DVT Study Patient Name:  Briana Sweeney  Date of Exam:    06/29/2022 Medical Rec #: [id number]          Accession #:    [id number] Date of Birth: Jan 09, 1971          Patient Gender: F Patient Age:   80 years Exam Location:  [facility] Procedure:      VAS [place] LOWER EXTREMITY VENOUS (DVT) Referring Phys: Herbie Baltimore PATERSON --------------------------------------------------------------------------------  Indications: Right foot edema, pain, erythema after dropping a crock pot on foot 1 month ago.  Comparison Study: No prior study on file Performing Technologist: Sharion Dove RVS  Examination Guidelines: A complete evaluation includes B-mode imaging, spectral Doppler, color Doppler, and power Doppler as needed of all accessible portions of each vessel. Bilateral testing is considered an integral part of a complete examination. Limited examinations for reoccurring indications may be performed as noted. The reflux portion of the exam is performed with the patient in reverse Trendelenburg.  +---------+---------------+---------+-----------+----------+--------------+ RIGHT    CompressibilityPhasicitySpontaneityPropertiesThrombus Aging +---------+---------------+---------+-----------+----------+--------------+ CFV      Full           Yes      Yes                                 +---------+---------------+---------+-----------+----------+--------------+ SFJ      Full                                                        +---------+---------------+---------+-----------+----------+--------------+ FV Prox  Full                                                        +---------+---------------+---------+-----------+----------+--------------+ FV Mid   Full                                                        +---------+---------------+---------+-----------+----------+--------------+  FV DistalFull                                                        +---------+---------------+---------+-----------+----------+--------------+ PFV      Full                                                         +---------+---------------+---------+-----------+----------+--------------+ POP      Full           Yes      Yes                                 +---------+---------------+---------+-----------+----------+--------------+ PTV      Full                                                        +---------+---------------+---------+-----------+----------+--------------+ PERO     Full                                                        +---------+---------------+---------+-----------+----------+--------------+ GSV      Full                                                        +---------+---------------+---------+-----------+----------+--------------+   +----+---------------+---------+-----------+----------+--------------+ LEFTCompressibilityPhasicitySpontaneityPropertiesThrombus Aging +----+---------------+---------+-----------+----------+--------------+ CFV Full           Yes      Yes                                 +----+---------------+---------+-----------+----------+--------------+     Summary: RIGHT: - There is no evidence of deep vein thrombosis in the lower extremity.  - No cystic structure found in the popliteal fossa. - Ultrasound characteristics of enlarged, vascularized lymph nodes are noted in the groin.  LEFT: - No evidence of common femoral vein obstruction.  *See table(s) above for measurements and observations. Electronically signed by Servando Snare MD on 06/30/2022 at 9:10:24 AM.    Final    DG Foot Complete Right  Result Date: 06/29/2022 CLINICAL DATA:  Dropped heavy object on foot 1 month ago. Persistent foot pain. EXAM: RIGHT FOOT COMPLETE - 3+ VIEW COMPARISON:  12/01/2019 FINDINGS: There is no evidence of acute fracture or dislocation. Old fracture deformities with internal fixation screws are again seen involving the 1st and 5th metatarsals. No other osseous abnormality identified. Prominent  dorsal midfoot soft tissue swelling is noted. IMPRESSION: Prominent dorsal midfoot soft tissue swelling. No evidence of acute fracture or dislocation. Electronically Signed   By: Myles Rosenthal.D.  On: 06/29/2022 17:05   - pertinent xrays, CT, MRI studies were reviewed and independently interpreted  Positive ROS: All other systems have been reviewed and were otherwise negative with the exception of those mentioned in the HPI and as above.  Physical Exam: General: Alert, no acute distress Psychiatric: Patient is competent for consent with normal mood and affect Lymphatic: No axillary or cervical lymphadenopathy Cardiovascular: No pedal edema Respiratory: No cyanosis, no use of accessory musculature GI: No organomegaly, abdomen is soft and non-tender    Images:  '@ENCIMAGES'$ @  Labs:  Lab Results  Component Value Date   HGBA1C 4.9 05/26/2020   HGBA1C 4.9 05/26/2020   HGBA1C 4.9 (A) 05/26/2020   HGBA1C 4.9 05/26/2020   REPTSTATUS PENDING 06/29/2022   GRAMSTAIN  05/14/2022    NO WBC SEEN NO ORGANISMS SEEN Performed at [facility], [place] [address], [place], [place] [address]    CULT  06/29/2022    NO GROWTH < 24 HOURS Performed at [place] [address] [place], [name] [address]    LABORGA ESCHERICHIA COLI (A) 12/15/2019    Lab Results  Component Value Date   ALBUMIN 1.6 (L) 07/01/2022   ALBUMIN 1.8 (L) 06/30/2022   ALBUMIN 2.2 (L) 06/29/2022   PREALBUMIN <5 (L) 01/23/2022        Latest Ref Rng & Units 07/01/2022    3:27 AM 06/30/2022    3:11 AM 06/29/2022    3:40 PM  CBC EXTENDED  WBC 4.0 - 10.5 K/uL 14.5  18.3  19.3   RBC 3.87 - 5.11 MIL/uL 3.98  4.50  5.22   Hemoglobin 12.0 - 15.0 g/dL 11.2  12.4  14.7   HCT 36.0 - 46.0 % 35.4  41.7  47.2   Platelets 150 - 400 K/uL 183  208  298   NEUT# 1.7 - 7.7 K/uL   18.3   Lymph# 0.7 - 4.0 K/uL   0.0     Neurologic: Patient does not have protective sensation bilateral lower  extremities.   MUSCULOSKELETAL:   Skin: Examination patient has swelling of both lower extremities and ascites.  Ultrasound was negative for DVT.  Patient also has a scratch on her left thigh without surrounding cellulitis that she states is secondary to a dog.  Patient has cellulitis extending proximal to her ankle.  There is purulent drainage from the midfoot ulcer both medially and laterally.  I cannot palpate a pulse secondary to swelling.  Patient's white cell count on admission was 19.3.  The month before her white cell count was 5.5.  White cell count currently 14.5.  Patient's albumin is 1.6.  Review of the radiographs does not show any Charcot arthropathy there is some demineralized bone through the midfoot.  Review of the MRI scan shows extensive abscess medially and laterally of the midfoot as well as extensive osteomyelitis of the cuboid across the cuneiforms and the base of the metatarsals.  Patient's absolute neutrophil count is 18.3.  Her absolute lymphocyte count is 0.  Patient's neutrophil to lymphocyte ratio is an indication of the severity of her critical illness.  Assessment: Assessment: Right foot extensive osteomyelitis and abscess across the midfoot without foot salvage intervention options.  Plan: We will plan for a right transtibial amputation possibly Tuesday morning or definitely Wednesday if operating room time is not available on Tuesday.  Thank you for the consult and the opportunity to see Ms. Barton Fanny, MD [place] [phone] 7:22 AM

## 2022-07-01 NOTE — Progress Notes (Signed)
  Echocardiogram 2D Echocardiogram has been performed.  Briana Sweeney 07/01/2022, 8:56 AM

## 2022-07-01 NOTE — Progress Notes (Signed)
PHARMACY - PHYSICIAN COMMUNICATION CRITICAL VALUE ALERT - BLOOD CULTURE IDENTIFICATION (BCID)  Briana Sweeney is an 51 y.o. female who presented to [facility] on 06/29/2022 with a chief complaint of right foot swelling, found to have R foot osteomyelitis, cellulitis, and myositis  Assessment:  Bcx growing MRSA in 2 of 4 bottles (updated from yesterday from 1 of 4)  Name of physician (or Provider) Contacted: Dr. Evette Doffing  Current antibiotics: Daptomycin '500mg'$  Q24H (ID consulted)  Changes to prescribed antibiotics recommended:  Patient is on recommended antibiotics - No changes needed  Results for orders placed or performed during the hospital encounter of 06/29/22  Blood Culture ID Panel (Reflexed) (Collected: 06/29/2022  3:46 PM)  Result Value Ref Range   Enterococcus faecalis NOT DETECTED NOT DETECTED   Enterococcus Faecium NOT DETECTED NOT DETECTED   Listeria monocytogenes NOT DETECTED NOT DETECTED   Staphylococcus species DETECTED (A) NOT DETECTED   Staphylococcus aureus (BCID) DETECTED (A) NOT DETECTED   Staphylococcus epidermidis NOT DETECTED NOT DETECTED   Staphylococcus lugdunensis NOT DETECTED NOT DETECTED   Streptococcus species NOT DETECTED NOT DETECTED   Streptococcus agalactiae NOT DETECTED NOT DETECTED   Streptococcus pneumoniae NOT DETECTED NOT DETECTED   Streptococcus pyogenes NOT DETECTED NOT DETECTED   A.calcoaceticus-baumannii NOT DETECTED NOT DETECTED   Bacteroides fragilis NOT DETECTED NOT DETECTED   Enterobacterales NOT DETECTED NOT DETECTED   Enterobacter cloacae complex NOT DETECTED NOT DETECTED   Escherichia coli NOT DETECTED NOT DETECTED   Klebsiella aerogenes NOT DETECTED NOT DETECTED   Klebsiella oxytoca NOT DETECTED NOT DETECTED   Klebsiella pneumoniae NOT DETECTED NOT DETECTED   Proteus species NOT DETECTED NOT DETECTED   Salmonella species NOT DETECTED NOT DETECTED   Serratia marcescens NOT DETECTED NOT DETECTED   Haemophilus influenzae NOT  DETECTED NOT DETECTED   Neisseria meningitidis NOT DETECTED NOT DETECTED   Pseudomonas aeruginosa NOT DETECTED NOT DETECTED   Stenotrophomonas maltophilia NOT DETECTED NOT DETECTED   Candida albicans NOT DETECTED NOT DETECTED   Candida auris NOT DETECTED NOT DETECTED   Candida glabrata NOT DETECTED NOT DETECTED   Candida krusei NOT DETECTED NOT DETECTED   Candida parapsilosis NOT DETECTED NOT DETECTED   Candida tropicalis NOT DETECTED NOT DETECTED   Cryptococcus neoformans/gattii NOT DETECTED NOT DETECTED   Meth resistant mecA/C and MREJ DETECTED (A) NOT [place], PharmD Clinical Pharmacist 07/01/2022  7:58 PM

## 2022-07-01 NOTE — Consult Note (Addendum)
ORTHOPAEDIC CONSULTATION  REQUESTING PHYSICIAN: Axel Filler, *  Chief Complaint: Pain and swelling right foot.  HPI: Briana Sweeney is a 51 y.o. female who presents with chronic osteomyelitis and abscess right foot.   Patient states that about 3 weeks ago she had blunt trauma to the right foot.  Patient presented to [facility] urgent care on October 5 and 6.  Patient states she was told she had bruising from the blunt trauma.   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[place]) 12/05/2018   Ascites due to alcoholic cirrhosis ([place])    Bipolar affective disorder ([place])    With anxiety features   Bunionette of right foot 11/2019   Cirrhosis of liver ([place])    Due to alcohol and hepatitis C   Cocaine dependence without complication ([place]) 73/53/2992   Decompensated hepatic cirrhosis ([place]) 07/23/2017   Depression with anxiety    Epistaxis 01/20/2019   Erysipelas 09/13/2018   Esophageal 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 ([place])    Esophageal varices without bleeding (HCC)    ETOHism (HCC)    GERD (gastroesophageal reflux disease)    Head injury    Hematemesis 02/10/2018   Hepatitis C 2018   hepatitis c and alcohol related hepatitis   Heroin abuse ([place])    History of blood transfusion    "blood doesn't clot; I fell down and had to have a transfusion"   History of kidney stones    Menopause 2016   Migraine    "when I get really stressed" (09/01/2017)   Neuropathy 10/27/2018   Olecranon bursitis of left elbow    Other mixed anxiety disorders 10/27/2018    Overdose 08/22/2018   Pancytopenia ([place]) 07/23/2017   Polysubstance abuse ([place]) 04/08/2018   Portal hypertension ([place]) 03/18/2018   S/P foot surgery, right 11/2019   Schizophrenia ([place])    Seizures ([place])    "when I run out of my RX; lots recently" (09/01/2017)   Suicidal ideation    Thrombocytopenia ([place])    Trichimoniasis 10/30/2017   Upper GI bleed 02/10/2018   Urinary urgency 05/2020   UTI (urinary tract infection) 06/02/2017   Past Surgical History:  Procedure Laterality Date   BUNIONECTOMY     ESOPHAGEAL BANDING  06/26/2021   Procedure: ESOPHAGEAL BANDING;  Surgeon: Jackquline Denmark, MD;  Location: [facility] ENDOSCOPY;  Service: Endoscopy;;   ESOPHAGOGASTRODUODENOSCOPY N/A 09/03/2017   Procedure: ESOPHAGOGASTRODUODENOSCOPY (EGD);  Surgeon: Doran Stabler, MD;  Location: [facility];  Service: Gastroenterology;  Laterality: N/A;   ESOPHAGOGASTRODUODENOSCOPY N/A 02/01/2022   Procedure: ESOPHAGOGASTRODUODENOSCOPY (EGD);  Surgeon: Clarene Essex, MD;  Location: [facility];  Service: Gastroenterology;  Laterality: N/A;   ESOPHAGOGASTRODUODENOSCOPY (EGD) WITH PROPOFOL N/A 06/26/2021   Procedure: ESOPHAGOGASTRODUODENOSCOPY (EGD) WITH PROPOFOL;  Surgeon: Jackquline Denmark, MD;  Location: [facility] ENDOSCOPY;  Service: Endoscopy;  Laterality: N/A;   FINGER FRACTURE SURGERY Left    "shattered my pinky"   FRACTURE SURGERY     I & D EXTREMITY Left 09/18/2018   Procedure: IRRIGATION AND DEBRIDEMENT EXTREMITY;  Surgeon: Leanora Cover, MD;  Location: [facility];  Service: Orthopedics;  Laterality: Left;  IR PARACENTESIS  07/23/2017   IR PARACENTESIS  07/2017   "did it twice in the same week" (09/01/2017)   IR PARACENTESIS  06/26/2021   SHOULDER OPEN ROTATOR CUFF REPAIR Right    TOOTH EXTRACTION  08/2019   TUBAL LIGATION     VAGINAL HYSTERECTOMY     Social History   Socioeconomic History   Marital status: Divorced    Spouse name: Not on file   Number of children: 2   Years of education: Not on file    Highest education level: Not on file  Occupational History   Occupation: applying for disability  Tobacco Use   Smoking status: Never   Smokeless tobacco: Never  Vaping Use   Vaping Use: Never used  Substance and Sexual Activity   Alcohol use: Yes    Alcohol/week: 6.0 standard drinks of alcohol    Types: 6 Cans of beer per week    Comment: pt states she had 1 can beer on 02/26/22   Drug use: Not Currently   Sexual activity: Not on file  Other Topics Concern   Not on file  Social History Narrative   She moved with a boyfriend to [place] and was followed at [facility].  He died of a massive heart attack in 8/18, per her report, and so she moved back to [place] and is living with a friend.      Right handed   2 story home    Lives alone 11/09/19   Social Determinants of Health   Financial Resource Strain: Not on file  Food Insecurity: No Food Insecurity (05/14/2022)   Hunger Vital Sign    Worried About Running Out of Food in the Last Year: Never true    Ran Out of Food in the Last Year: Never true  Transportation Needs: No Transportation Needs (05/14/2022)   PRAPARE - Hydrologist (Medical): No    Lack of Transportation (Non-Medical): No  Physical Activity: Not on file  Stress: Not on file  Social Connections: Not on file   Family History  Problem Relation Age of Onset   Lung cancer Mother 61   Alcohol abuse Mother    Throat cancer Father 57   - negative except otherwise stated in the family history section Allergies  Allergen Reactions   Other Other (See Comments)    Platelets: Rx chest pain, tremors, body aches  Patient stated that she receives platelet injection every Wednesday    Prior to Admission medications   Medication Sig Start Date End Date Taking? Authorizing Provider  acamprosate (CAMPRAL) 333 MG tablet Take 2 tablets (666 mg total) by mouth 3 (three) times daily Patient not taking: Reported on 05/13/2022 04/09/22      amoxicillin-clavulanate (AUGMENTIN) 875-125 MG tablet Take 1 tablet by mouth 2 (two) times daily. 05/17/22     ARIPiprazole (ABILIFY) 10 MG tablet Take 1 tablet by mouth every day Patient taking differently: Take 10 mg by mouth every morning. 11/22/21     atovaquone (MEPRON) 750 MG/5ML suspension Take by mouth. 04/09/22   [provider]  clonazePAM (KLONOPIN) 0.5 MG tablet TAKE 1 TABLET BY MOUTH TWICE DAILY Patient taking differently: Take 0.5 mg by mouth 2 (two) times daily as needed for anxiety. 02/14/22     clonazePAM (KLONOPIN) 0.5 MG tablet Take by mouth. 08/30/21   [provider]  diclofenac Sodium (VOLTAREN) 1 % GEL Apply 2 g topically daily. Apply to hands    [provider]  FLUoxetine (PROZAC) 40 MG capsule Take 1 capsule (40 mg total) by mouth daily. Patient taking differently: Take 40 mg by mouth every morning. 11/22/21     FLUoxetine (PROZAC) 40 MG capsule Take 1 capsule by mouth daily. 08/30/21   [provider]  folic acid (FOLVITE) 1 MG tablet Take by mouth. 10/30/21 10/30/22  [provider]  furosemide (LASIX) 40 MG tablet Take 40 mg by mouth daily.    [provider]  ibuprofen (ADVIL) 800 MG tablet Take 800 mg by mouth 3 (three) times daily as needed. 06/17/22   [provider]  lactulose (CHRONULAC) 10 GM/15ML solution Take 30 mLs (20 g total) by mouth 3 (three) times daily. 01/24/22   Aline August, MD  lactulose, encephalopathy, ([place]) 10 GM/15ML SOLN Take by mouth. 06/25/22 06/25/23  [provider]  levETIRAcetam (KEPPRA) 750 MG tablet Take 1 tablet (750 mg total) by mouth 2 (two) times daily. 07/06/21   Cameron Sprang, MD  Multiple Vitamin [place]) TABS Take 1 tablet by mouth every morning. 10/30/21 10/30/22  [provider]  Multiple Vitamins-Minerals (MULTIVITAMIN WITH MINERALS) tablet Take 1 tablet by mouth daily. 03/12/22 03/12/23  Aline August, MD  nadolol (CORGARD) 20 MG tablet Take 1  tablet (20 mg total) by mouth every morning. 05/15/22 06/15/22  Dessa Phi, DO  OVER THE COUNTER MEDICATION Take 1 capsule by mouth daily. OLLY Ultra brain soft gel    [provider]  pantoprazole (PROTONIX) 40 MG tablet Take 40 mg by mouth daily.    [provider]  potassium chloride (KLOR-CON) 20 MEQ packet Take 1 packet (20 mEq) by mouth daily as directed 05/15/22   Dessa Phi, DO  predniSONE (DELTASONE) 50 MG tablet Take by mouth.    [provider]  romiPLOStim (NPLATE) 125 MCG injection Inject into the skin. Patient not taking: Reported on 05/14/2022 04/09/22   [provider]  spironolactone (ALDACTONE) 50 MG tablet Take 100 mg by mouth daily. 06/30/21   [provider]  thiamine 100 MG tablet Take 1 tablet (100 mg total) by mouth daily. 03/13/22   Aline August, MD   MR FOOT RIGHT W WO CONTRAST  Result Date: 06/30/2022 CLINICAL DATA:  Foot swelling, nondiabetic, osteomyelitis suspected EXAM: MRI OF THE RIGHT FOREFOOT WITHOUT AND WITH CONTRAST TECHNIQUE: Multiplanar, multisequence MR imaging of the right forefoot was performed before and after the administration of intravenous contrast. CONTRAST:  6.95m GADAVIST GADOBUTROL 1 MMOL/ML IV SOLN COMPARISON:  X-ray 06/29/2022 FINDINGS: Bones/Joint/Cartilage Intense bone marrow edema with enhancement involving multiple bones within the midfoot including the cuboid, medial cuneiform, intermediate cuneiform, lateral cuneiform as well as the bases of the second through fifth metatarsals. Appearance is most compatible with acute osteomyelitis. There are small effusions throughout the midfoot and across the TMT joints concerning for septic arthritis. Nondisplaced fractures at these locations are not excluded. Mild marrow edema within the navicular. Susceptibility artifact from hardware within the first and fifth metatarsals. No dislocations. Ligaments Lisfranc ligament is thickened and heterogeneous, which could  reflect sequela of prior trauma or reactive changes from local inflammation. Intact collateral ligaments. Muscles and Tendons Large volume tenosynovitis involving the extensor hallucis longus tendon and extensor digitorum tendons within the forefoot and midfoot. These tenosynovial fluid collections appear contiguous with adjacent rim enhancing abscesses within the soft tissues of the midfoot and proximal forefoot. Focal dorsal midfoot collection predominantly involving the extensor digitorum tendons measures approximately 4.0 x 1.7 x 4.0 cm (series 4,  image 10). More medially located dorsal midfoot collection measures approximately 4.0 x 2.0 x 2.5 cm (series 4, image 4). More laterally located collection within the midfoot measures approximately 2.7 x 1.3 x 2.7 cm (series 9, image 5). Focal tenosynovitis at the knot of Henry within the plantar midfoot. Diffuse edema-like signal throughout the foot musculature. Soft tissues Marked soft tissue swelling of the forefoot and midfoot. Wound or ulceration at the dorso lateral aspect of the midfoot. IMPRESSION: 1. Findings are most compatible with extensive osteomyelitis throughout the bones of the midfoot and the bases of the second through fifth metatarsals. Small joint effusions throughout the midfoot and within the TMT joints is concerning for septic arthritis. 2. Large volume tenosynovitis involving the extensor hallucis longus tendon and extensor digitorum tendons within the forefoot and midfoot. These tenosynovial fluid collections appear contiguous with multiple adjacent rim enhancing abscesses within the soft tissues of the midfoot and proximal forefoot, as described. 3. Findings of diffuse cellulitis and myositis of the right foot. Electronically Signed   By: Davina Poke D.O.   On: 06/30/2022 09:39   VAS [place] LOWER EXTREMITY VENOUS (DVT) (ONLY [facility] & [facility])  Result Date: 06/30/2022  Lower Venous DVT Study Patient Name:  JEIMY BICKERT  Date of Exam:    06/29/2022 Medical Rec #: [id number]          Accession #:    [id number] Date of Birth: 1971/02/11          Patient Gender: F Patient Age:   72 years Exam Location:  [facility] Procedure:      VAS [place] LOWER EXTREMITY VENOUS (DVT) Referring Phys: Herbie Baltimore PATERSON --------------------------------------------------------------------------------  Indications: Right foot edema, pain, erythema after dropping a crock pot on foot 1 month ago.  Comparison Study: No prior study on file Performing Technologist: Sharion Dove RVS  Examination Guidelines: A complete evaluation includes B-mode imaging, spectral Doppler, color Doppler, and power Doppler as needed of all accessible portions of each vessel. Bilateral testing is considered an integral part of a complete examination. Limited examinations for reoccurring indications may be performed as noted. The reflux portion of the exam is performed with the patient in reverse Trendelenburg.  +---------+---------------+---------+-----------+----------+--------------+ RIGHT    CompressibilityPhasicitySpontaneityPropertiesThrombus Aging +---------+---------------+---------+-----------+----------+--------------+ CFV      Full           Yes      Yes                                 +---------+---------------+---------+-----------+----------+--------------+ SFJ      Full                                                        +---------+---------------+---------+-----------+----------+--------------+ FV Prox  Full                                                        +---------+---------------+---------+-----------+----------+--------------+ FV Mid   Full                                                        +---------+---------------+---------+-----------+----------+--------------+  FV DistalFull                                                        +---------+---------------+---------+-----------+----------+--------------+ PFV      Full                                                         +---------+---------------+---------+-----------+----------+--------------+ POP      Full           Yes      Yes                                 +---------+---------------+---------+-----------+----------+--------------+ PTV      Full                                                        +---------+---------------+---------+-----------+----------+--------------+ PERO     Full                                                        +---------+---------------+---------+-----------+----------+--------------+ GSV      Full                                                        +---------+---------------+---------+-----------+----------+--------------+   +----+---------------+---------+-----------+----------+--------------+ LEFTCompressibilityPhasicitySpontaneityPropertiesThrombus Aging +----+---------------+---------+-----------+----------+--------------+ CFV Full           Yes      Yes                                 +----+---------------+---------+-----------+----------+--------------+     Summary: RIGHT: - There is no evidence of deep vein thrombosis in the lower extremity.  - No cystic structure found in the popliteal fossa. - Ultrasound characteristics of enlarged, vascularized lymph nodes are noted in the groin.  LEFT: - No evidence of common femoral vein obstruction.  *See table(s) above for measurements and observations. Electronically signed by Servando Snare MD on 06/30/2022 at 9:10:24 AM.    Final    DG Foot Complete Right  Result Date: 06/29/2022 CLINICAL DATA:  Dropped heavy object on foot 1 month ago. Persistent foot pain. EXAM: RIGHT FOOT COMPLETE - 3+ VIEW COMPARISON:  12/01/2019 FINDINGS: There is no evidence of acute fracture or dislocation. Old fracture deformities with internal fixation screws are again seen involving the 1st and 5th metatarsals. No other osseous abnormality identified. Prominent  dorsal midfoot soft tissue swelling is noted. IMPRESSION: Prominent dorsal midfoot soft tissue swelling. No evidence of acute fracture or dislocation. Electronically Signed   By: Myles Rosenthal.D.  On: 06/29/2022 17:05   - pertinent xrays, CT, MRI studies were reviewed and independently interpreted  Positive ROS: All other systems have been reviewed and were otherwise negative with the exception of those mentioned in the HPI and as above.  Physical Exam: General: Alert, no acute distress Psychiatric: Patient is competent for consent with normal mood and affect Lymphatic: No axillary or cervical lymphadenopathy Cardiovascular: No pedal edema Respiratory: No cyanosis, no use of accessory musculature GI: No organomegaly, abdomen is soft and non-tender    Images:  '@ENCIMAGES'$ @  Labs:  Lab Results  Component Value Date   HGBA1C 4.9 05/26/2020   HGBA1C 4.9 05/26/2020   HGBA1C 4.9 (A) 05/26/2020   HGBA1C 4.9 05/26/2020   REPTSTATUS PENDING 06/29/2022   GRAMSTAIN  05/14/2022    NO WBC SEEN NO ORGANISMS SEEN Performed at [facility], [place] [address] [place], [place] [address]    CULT  06/29/2022    NO GROWTH < 24 HOURS Performed at [place] [address], [place], [place] [address]    LABORGA ESCHERICHIA COLI (A) 12/15/2019    Lab Results  Component Value Date   ALBUMIN 1.6 (L) 07/01/2022   ALBUMIN 1.8 (L) 06/30/2022   ALBUMIN 2.2 (L) 06/29/2022   PREALBUMIN <5 (L) 01/23/2022        Latest Ref Rng & Units 07/01/2022    3:27 AM 06/30/2022    3:11 AM 06/29/2022    3:40 PM  CBC EXTENDED  WBC 4.0 - 10.5 K/uL 14.5  18.3  19.3   RBC 3.87 - 5.11 MIL/uL 3.98  4.50  5.22   Hemoglobin 12.0 - 15.0 g/dL 11.2  12.4  14.7   HCT 36.0 - 46.0 % 35.4  41.7  47.2   Platelets 150 - 400 K/uL 183  208  298   NEUT# 1.7 - 7.7 K/uL   18.3   Lymph# 0.7 - 4.0 K/uL   0.0     Neurologic: Patient does not have protective sensation bilateral lower  extremities.   MUSCULOSKELETAL:   Skin: Examination patient has swelling of both lower extremities and ascites.  Ultrasound was negative for DVT.  Patient also has a scratch on her left thigh without surrounding cellulitis that she states is secondary to a dog.  Patient has cellulitis extending proximal to her ankle.  There is purulent drainage from the midfoot ulcer both medially and laterally.  I cannot palpate a pulse secondary to swelling.  Patient's white cell count on admission was 19.3.  The month before her white cell count was 5.5.  White cell count currently 14.5.  Patient's albumin is 1.6.  Review of the radiographs does not show any Charcot arthropathy there is some demineralized bone through the midfoot.  Review of the MRI scan shows extensive abscess medially and laterally of the midfoot as well as extensive osteomyelitis of the cuboid across the cuneiforms and the base of the metatarsals.  Patient's absolute neutrophil count is 18.3.  Her absolute lymphocyte count is 0.  Patient's neutrophil to lymphocyte ratio is an indication of the severity of her critical illness.  Assessment: Assessment: Right foot extensive osteomyelitis and abscess across the midfoot without foot salvage intervention options.  Plan: We will plan for a right transtibial amputation possibly Tuesday morning or definitely Wednesday if operating room time is not available on Tuesday.  Thank you for the consult and the opportunity to see Ms. Barton Fanny, MD [place] [phone] 7:22 AM

## 2022-07-01 NOTE — Progress Notes (Signed)
  Transition of Care [facility]) Screening Note   Patient Details  Name: Bama Hanselman Date of Birth: [birth year]   Transition of Care [facility]) CM/SW Contact:    Tom-Johnson, Renea Ee, RN Phone Number: 07/01/2022, 4:28 PM  CM spoke with patient at bedside about discharge disposition. Admitted for Sepsis 2/2 Osteomyelitis to Rt foot. On IV abx, ID following.  Ortho consulted, tentative Rt Transtibial amputation Tuesday or Wednesday.  From home alone, has two daughters and one sister. On disability. Uses [employer] for transportation to and from appointments. Has a wheelchair. PCP is Marco Collie, MD and uses [facility] in [place].   Transition of Care Department [facility]) has reviewed patient and no TOC needs or recommendations have been identified at this time. TOC will continue to monitor patient advancement through interdisciplinary progression rounds. If new patient transition needs arise, please place a TOC consult.

## 2022-07-01 NOTE — Progress Notes (Signed)
Briana Sweeney for Infectious Disease  Date of Admission:  06/29/2022           Reason for visit: Follow up on MRSA bacteremia  Current antibiotics: Daptomycin  ASSESSMENT:    51 y.o. female admitted with:  Right foot osteomyelitis, cellulitis, and myositis MRSA bacteremia Acute renal failure Decompensated hepatic cirrhosis Treated hepatitis C  RECOMMENDATIONS:    Continue daptomycin per pharmacy Follow repeat blood cultures drawn 10/22 Appreciate orthopedic surgery evaluation whom has recommended right transtibial amputation Tuesday or Wednesday depending on OR availability TTE was negative for valvular vegetation TEE would be preferable in this situation as presence of endocarditis would change her management, however, this may be prohibitive based on the presence of grade 1 esophageal varices from EGD as recently as May 2023 For now, hold off on TEE and await repeat blood cultures Will follow   Principal Problem:   Sepsis ([place]) Active Problems:   Acute renal failure superimposed on stage 3a chronic kidney disease (HCC)   Severe protein-calorie malnutrition (HCC)   Cellulitis of right foot   Hepatic cirrhosis (HCC)   Foot osteomyelitis, right (Briana Sweeney)    MEDICATIONS:    Scheduled Meds:  enoxaparin (LOVENOX) injection  40 mg Subcutaneous Q24H   FLUoxetine  40 mg Oral Daily   folic acid  1 mg Oral Daily   lactulose  20 g Oral TID   levETIRAcetam  750 mg Oral BID   multivitamin with minerals  1 tablet Oral Daily   pantoprazole  40 mg Oral Daily   thiamine  100 mg Oral Daily   Continuous Infusions:  DAPTOmycin (CUBICIN) 500 mg in sodium chloride 0.9 % IVPB     lactated ringers 100 mL/hr at 07/01/22 1119   PRN Meds:.acetaminophen, clonazePAM, HYDROmorphone (DILAUDID) injection, HYDROmorphone, ondansetron (ZOFRAN) IV, mouth rinse  SUBJECTIVE:   24 hour events:  No acute events noted overnight Evaluated by orthopedic surgery TTE completed Repeat blood  cultures drawn Remains on antibiotics with daptomycin  No new complaints.  Tearful.  Reports talking with orthopedic surgery who has recommended amputation  Review of Systems  All other systems reviewed and are negative.     OBJECTIVE:   Blood pressure 110/73, pulse (!) 120, temperature 98.4 F (36.9 C), temperature source Oral, resp. rate 19, height '4\' 11"'$  (1.499 m), weight 68 kg, SpO2 93 %. Body mass index is 30.28 kg/m.  Physical Exam Constitutional:      General: She is not in acute distress. HENT:     Head: Normocephalic and atraumatic.  Pulmonary:     Effort: Pulmonary effort is normal. No respiratory distress.  Abdominal:     General: There is no distension.     Palpations: Abdomen is soft.  Musculoskeletal:        General: Swelling present.     Comments: Right foot swollen and edematous with redness and drainage.  Skin:    General: Skin is warm and dry.     Findings: Erythema present.  Neurological:     General: No focal deficit present.     Mental Status: She is alert and oriented to person, place, and time.  Psychiatric:        Mood and Affect: Mood normal.        Behavior: Behavior normal.      Lab Results: Lab Results  Component Value Date   WBC 14.5 (H) 07/01/2022   HGB 11.2 (L) 07/01/2022   HCT 35.4 (L) 07/01/2022   MCV  88.9 07/01/2022   PLT 183 07/01/2022    Lab Results  Component Value Date   NA 133 (L) 07/01/2022   K 4.3 07/01/2022   CO2 22 07/01/2022   GLUCOSE 119 (H) 07/01/2022   BUN 21 (H) 07/01/2022   CREATININE 1.11 (H) 07/01/2022   CALCIUM 8.2 (L) 07/01/2022   GFRNONAA >60 07/01/2022   GFRAA >60 06/07/2020    Lab Results  Component Value Date   ALT 22 07/01/2022   AST 29 07/01/2022   GGT 387 (H) 12/10/2018   ALKPHOS 164 (H) 07/01/2022   BILITOT 7.1 (H) 07/01/2022    No results found for: "CRP"  No results found for: "ESRSEDRATE"   I have reviewed the micro and lab results in Epic.  Imaging: ECHOCARDIOGRAM  COMPLETE  Result Date: 07/01/2022    ECHOCARDIOGRAM REPORT   Patient Name:   Briana Sweeney Date of Exam: 07/01/2022 Medical Rec #:  [id number]         Height:       59.0 in Accession #:    [id number]        Weight:       149.9 lb Date of Birth:  1971-03-04         BSA:          69.632 m Patient Age:    13 years          BP:           124/68 mmHg Patient Gender: F                 HR:           123 bpm. Exam Location:  Inpatient Procedure: 2D Echo, Cardiac Doppler and Color Doppler Indications:     Bacteremia  History:         Patient has prior history of Echocardiogram examinations, most                  recent 07/25/2021. Arrythmias:Tachycardia. Hepatic cirrhosis,                  ETOH, Hepatitis C, ascites.  Sonographer:     Eartha Inch Referring Phys:  [id number] CORNELIUS N VAN DAM Diagnosing Phys: Franki Monte  Sonographer Comments: Image acquisition challenging due to patient body habitus and Image acquisition challenging due to respiratory motion. IMPRESSIONS  1. Left ventricular ejection fraction, by estimation, is 65 to 70%. The left ventricle has normal function. The left ventricle has no regional wall motion abnormalities. Left ventricular diastolic parameters are consistent with Grade I diastolic dysfunction (impaired relaxation).  2. Right ventricular systolic function is normal. The right ventricular size is normal. There is normal pulmonary artery systolic pressure. The estimated right ventricular systolic pressure is 02.4 mmHg.  3. The mitral valve is normal in structure. No evidence of mitral valve regurgitation. No evidence of mitral stenosis.  4. The aortic valve is tricuspid. Aortic valve regurgitation is not visualized. No aortic stenosis is present.  5. The inferior vena cava is normal in size with greater than 50% respiratory variability, suggesting right atrial pressure of 3 mmHg.  6. No vegetation noted. FINDINGS  Left Ventricle: Left ventricular ejection fraction, by estimation, is 65  to 70%. The left ventricle has normal function. The left ventricle has no regional wall motion abnormalities. The left ventricular internal cavity size was normal in size. There is  no left ventricular hypertrophy. Left ventricular diastolic parameters are consistent with Grade I diastolic dysfunction (impaired  relaxation). Right Ventricle: The right ventricular size is normal. No increase in right ventricular wall thickness. Right ventricular systolic function is normal. There is normal pulmonary artery systolic pressure. The tricuspid regurgitant velocity is 2.65 m/s, and  with an assumed right atrial pressure of 3 mmHg, the estimated right ventricular systolic pressure is 81.8 mmHg. Left Atrium: Left atrial size was normal in size. Right Atrium: Right atrial size was normal in size. Pericardium: There is no evidence of pericardial effusion. Mitral Valve: The mitral valve is normal in structure. No evidence of mitral valve regurgitation. No evidence of mitral valve stenosis. Tricuspid Valve: The tricuspid valve is normal in structure. Tricuspid valve regurgitation is trivial. Aortic Valve: The aortic valve is tricuspid. Aortic valve regurgitation is not visualized. No aortic stenosis is present. Pulmonic Valve: The pulmonic valve was normal in structure. Pulmonic valve regurgitation is trivial. Aorta: The aortic root is normal in size and structure. Venous: The inferior vena cava is normal in size with greater than 50% respiratory variability, suggesting right atrial pressure of 3 mmHg. IAS/Shunts: No atrial level shunt detected by color flow Doppler.  LEFT VENTRICLE PLAX 2D LVIDd:         4.10 cm   Diastology LVIDs:         2.30 cm   LV e' medial:  6.20 cm/s LV PW:         0.90 cm   LV e' lateral: 10.80 cm/s LV IVS:        0.80 cm LVOT diam:     2.10 cm LV SV:         76 LV SV Index:   47 LVOT Area:     3.46 cm  RIGHT VENTRICLE RV S prime:     34.70 cm/s TAPSE (M-mode): 2.0 cm LEFT ATRIUM             Index         RIGHT ATRIUM           Index LA diam:        3.00 cm 1.84 cm/m   RA Area:     12.50 cm LA Vol (A2C):   26.5 ml 16.24 ml/m  RA Volume:   26.90 ml  16.49 ml/m LA Vol (A4C):   44.9 ml 27.52 ml/m LA Biplane Vol: 35.6 ml 21.82 ml/m  AORTIC VALVE LVOT Vmax:   140.00 cm/s LVOT Vmean:  96.200 cm/s LVOT VTI:    0.220 m  AORTA Ao Root diam: 2.60 cm Ao Asc diam:  2.70 cm TRICUSPID VALVE TR Peak grad:   28.1 mmHg TR Vmax:        265.00 cm/s  SHUNTS Systemic VTI:  0.22 m Systemic Diam: 2.10 cm Dalton McleanMD Electronically signed by Franki Monte Signature Date/Time: 07/01/2022/10:22:17 AM    Final (Updated)    MR FOOT RIGHT W WO CONTRAST  Result Date: 06/30/2022 CLINICAL DATA:  Foot swelling, nondiabetic, osteomyelitis suspected EXAM: MRI OF THE RIGHT FOREFOOT WITHOUT AND WITH CONTRAST TECHNIQUE: Multiplanar, multisequence MR imaging of the right forefoot was performed before and after the administration of intravenous contrast. CONTRAST:  6.69m GADAVIST GADOBUTROL 1 MMOL/ML IV SOLN COMPARISON:  X-ray 06/29/2022 FINDINGS: Bones/Joint/Cartilage Intense bone marrow edema with enhancement involving multiple bones within the midfoot including the cuboid, medial cuneiform, intermediate cuneiform, lateral cuneiform as well as the bases of the second through fifth metatarsals. Appearance is most compatible with acute osteomyelitis. There are small effusions throughout the midfoot and across the TMT joints concerning  for septic arthritis. Nondisplaced fractures at these locations are not excluded. Mild marrow edema within the navicular. Susceptibility artifact from hardware within the first and fifth metatarsals. No dislocations. Ligaments Lisfranc ligament is thickened and heterogeneous, which could reflect sequela of prior trauma or reactive changes from local inflammation. Intact collateral ligaments. Muscles and Tendons Large volume tenosynovitis involving the extensor hallucis longus tendon and extensor digitorum  tendons within the forefoot and midfoot. These tenosynovial fluid collections appear contiguous with adjacent rim enhancing abscesses within the soft tissues of the midfoot and proximal forefoot. Focal dorsal midfoot collection predominantly involving the extensor digitorum tendons measures approximately 4.0 x 1.7 x 4.0 cm (series 4, image 10). More medially located dorsal midfoot collection measures approximately 4.0 x 2.0 x 2.5 cm (series 4, image 4). More laterally located collection within the midfoot measures approximately 2.7 x 1.3 x 2.7 cm (series 9, image 5). Focal tenosynovitis at the knot of Henry within the plantar midfoot. Diffuse edema-like signal throughout the foot musculature. Soft tissues Marked soft tissue swelling of the forefoot and midfoot. Wound or ulceration at the dorso lateral aspect of the midfoot. IMPRESSION: 1. Findings are most compatible with extensive osteomyelitis throughout the bones of the midfoot and the bases of the second through fifth metatarsals. Small joint effusions throughout the midfoot and within the TMT joints is concerning for septic arthritis. 2. Large volume tenosynovitis involving the extensor hallucis longus tendon and extensor digitorum tendons within the forefoot and midfoot. These tenosynovial fluid collections appear contiguous with multiple adjacent rim enhancing abscesses within the soft tissues of the midfoot and proximal forefoot, as described. 3. Findings of diffuse cellulitis and myositis of the right foot. Electronically Signed   By: Davina Poke D.O.   On: 06/30/2022 09:39   VAS [place] LOWER EXTREMITY VENOUS (DVT) (ONLY [facility] & [facility])  Result Date: 06/30/2022  Lower Venous DVT Study Patient Name:  EVONY REZEK  Date of Exam:   06/29/2022 Medical Rec #: [id number]          Accession #:    [id number] Date of Birth: Sep 16, 1970          Patient Gender: F Patient Age:   67 years Exam Location:  [facility] Procedure:      VAS [place] LOWER EXTREMITY  VENOUS (DVT) Referring Phys: Herbie [place] PATERSON --------------------------------------------------------------------------------  Indications: Right foot edema, pain, erythema after dropping a crock pot on foot 1 month ago.  Comparison Study: No prior study on file Performing Technologist: Sharion Dove RVS  Examination Guidelines: A complete evaluation includes B-mode imaging, spectral Doppler, color Doppler, and power Doppler as needed of all accessible portions of each vessel. Bilateral testing is considered an integral part of a complete examination. Limited examinations for reoccurring indications may be performed as noted. The reflux portion of the exam is performed with the patient in reverse Trendelenburg.  +---------+---------------+---------+-----------+----------+--------------+ RIGHT    CompressibilityPhasicitySpontaneityPropertiesThrombus Aging +---------+---------------+---------+-----------+----------+--------------+ CFV      Full           Yes      Yes                                 +---------+---------------+---------+-----------+----------+--------------+ SFJ      Full                                                        +---------+---------------+---------+-----------+----------+--------------+  FV Prox  Full                                                        +---------+---------------+---------+-----------+----------+--------------+ FV Mid   Full                                                        +---------+---------------+---------+-----------+----------+--------------+ FV DistalFull                                                        +---------+---------------+---------+-----------+----------+--------------+ PFV      Full                                                        +---------+---------------+---------+-----------+----------+--------------+ POP      Full           Yes      Yes                                  +---------+---------------+---------+-----------+----------+--------------+ PTV      Full                                                        +---------+---------------+---------+-----------+----------+--------------+ PERO     Full                                                        +---------+---------------+---------+-----------+----------+--------------+ GSV      Full                                                        +---------+---------------+---------+-----------+----------+--------------+   +----+---------------+---------+-----------+----------+--------------+ LEFTCompressibilityPhasicitySpontaneityPropertiesThrombus Aging +----+---------------+---------+-----------+----------+--------------+ CFV Full           Yes      Yes                                 +----+---------------+---------+-----------+----------+--------------+     Summary: RIGHT: - There is no evidence of deep vein thrombosis in the lower extremity.  - No cystic structure found in the popliteal fossa. - Ultrasound characteristics of enlarged, vascularized lymph nodes are noted in the groin.  LEFT: - No evidence of common femoral vein obstruction.  *See  table(s) above for measurements and observations. Electronically signed by Servando Snare MD on 06/30/2022 at 9:10:24 AM.    Final    DG Foot Complete Right  Result Date: 06/29/2022 CLINICAL DATA:  Dropped heavy object on foot 1 month ago. Persistent foot pain. EXAM: RIGHT FOOT COMPLETE - 3+ VIEW COMPARISON:  12/01/2019 FINDINGS: There is no evidence of acute fracture or dislocation. Old fracture deformities with internal fixation screws are again seen involving the 1st and 5th metatarsals. No other osseous abnormality identified. Prominent dorsal midfoot soft tissue swelling is noted. IMPRESSION: Prominent dorsal midfoot soft tissue swelling. No evidence of acute fracture or dislocation. Electronically Signed   By: Marlaine Hind M.D.   On:  06/29/2022 17:05     Imaging independently reviewed in Epic.    Raynelle Highland for Infectious Disease [facility] [phone] pager 07/01/2022, 12:51 PM  I have personally spent 50 minutes involved in face-to-face and non-face-to-face activities for this patient on the day of the visit. Professional time spent includes the following activities: Preparing to see the patient (review of tests), Obtaining and/or reviewing separately obtained history (admission/discharge record), Performing a medically appropriate examination and/or evaluation , Ordering medications/tests/procedures, referring and communicating with other health care professionals, Documenting clinical information in the EMR, Independently interpreting results (not separately reported), Communicating results to the patient/family/caregiver, Counseling and educating the patient/family/caregiver and Care coordination (not separately reported).

## 2022-07-01 NOTE — Progress Notes (Signed)
Initial Nutrition Assessment  DOCUMENTATION CODES:   Not applicable  INTERVENTION:  - Add Banatrol BID.   - Add Ensure Enlive po BID, each supplement provides 350 kcal and 20 grams of protein.   NUTRITION DIAGNOSIS:   Inadequate oral intake related to poor appetite as evidenced by meal completion < 50%.  GOAL:   Patient will meet greater than or equal to 90% of their needs  MONITOR:   PO intake, Supplement acceptance  REASON FOR ASSESSMENT:   Malnutrition Screening Tool    ASSESSMENT:   51 y.o. female admits related to right foot swelling. 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  Meds include:  folic acid, MVI, Vit B1. Labs reviewed: Na low.   The pt reports that she has not had a good appetite over the past month due to severe foot pain. Per record, pt has eaten 25-50% of her meals since admission. No significant wt loss per record. Pt also reports that she has been experiencing diarrhea over the past month. Pt agrees to try Banatrol BID and Ensure BID. RD will continue to monitor PO intakes. Needs increased r/t bacteremia.   NUTRITION - FOCUSED PHYSICAL EXAM:  Flowsheet Row Most Recent Value  Orbital Region No depletion  Upper Arm Region No depletion  Thoracic and Lumbar Region No depletion  Buccal Region No depletion  Temple Region No depletion  Clavicle Bone Region No depletion  Clavicle and Acromion Bone Region No depletion  Scapular Bone Region No depletion  Dorsal Hand No depletion  Patellar Region No depletion  Anterior Thigh Region No depletion  Posterior Calf Region No depletion  Edema (RD Assessment) None  Hair Reviewed  Eyes Reviewed  Mouth Reviewed  Skin Reviewed  Nails Reviewed       Diet Order:   Diet Order             Diet NPO time specified  Diet effective ____           Diet NPO time specified  Diet effective midnight           Diet 2  gram sodium Room service appropriate? Yes; Fluid consistency: Thin  Diet effective now                   EDUCATION NEEDS:   Not appropriate for education at this time  Skin:  Skin Assessment: Skin Integrity Issues: Skin Integrity Issues:: Incisions Incisions: Right foot  Last BM:  06/30/22  Height:   Ht Readings from Last 1 Encounters:  06/30/22 '4\' 11"'$  (1.499 m)    Weight:   Wt Readings from Last 1 Encounters:  07/01/22 68 kg    Ideal Body Weight:  43.2 kg  BMI:  Body mass index is 30.28 kg/m.  Estimated Nutritional Needs:   Kcal:  1700-2040 kcals  Protein:  85-100 gm  Fluid:  1700-2040 mL  Thalia Bloodgood, RD, LDN, CNSC.

## 2022-07-02 ENCOUNTER — Encounter (HOSPITAL_COMMUNITY)
Admission: EM | Disposition: A | Discharge status: Rehabilitation Facility | Payer: Self-pay | Source: Home / Self Care | Attending: Student in an Organized Health Care Education/Training Program

## 2022-07-02 ENCOUNTER — Other Ambulatory Visit: Payer: Self-pay

## 2022-07-02 ENCOUNTER — Inpatient Hospital Stay (HOSPITAL_COMMUNITY): Payer: No Typology Code available for payment source

## 2022-07-02 DIAGNOSIS — R7881 Bacteremia: Secondary | ICD-10-CM | POA: Diagnosis not present

## 2022-07-02 DIAGNOSIS — E43 Unspecified severe protein-calorie malnutrition: Secondary | ICD-10-CM | POA: Diagnosis not present

## 2022-07-02 DIAGNOSIS — M86071 Acute hematogenous osteomyelitis, right ankle and foot: Secondary | ICD-10-CM | POA: Diagnosis not present

## 2022-07-02 DIAGNOSIS — I739 Peripheral vascular disease, unspecified: Secondary | ICD-10-CM | POA: Diagnosis not present

## 2022-07-02 DIAGNOSIS — L03115 Cellulitis of right lower limb: Secondary | ICD-10-CM | POA: Diagnosis not present

## 2022-07-02 DIAGNOSIS — F418 Other specified anxiety disorders: Secondary | ICD-10-CM

## 2022-07-02 DIAGNOSIS — L02611 Cutaneous abscess of right foot: Secondary | ICD-10-CM | POA: Diagnosis not present

## 2022-07-02 DIAGNOSIS — B9562 Methicillin resistant Staphylococcus aureus infection as the cause of diseases classified elsewhere: Secondary | ICD-10-CM | POA: Diagnosis not present

## 2022-07-02 DIAGNOSIS — M60073 Infective myositis, right foot: Secondary | ICD-10-CM | POA: Diagnosis not present

## 2022-07-02 DIAGNOSIS — M86471 Chronic osteomyelitis with draining sinus, right ankle and foot: Secondary | ICD-10-CM | POA: Diagnosis not present

## 2022-07-02 HISTORY — PX: AMPUTATION: SHX166

## 2022-07-02 HISTORY — PX: APPLICATION OF WOUND VAC: SHX5189

## 2022-07-02 LAB — COMPREHENSIVE METABOLIC PANEL
ALT: 20 U/L (ref 0–44)
AST: 39 U/L (ref 15–41)
Albumin: 1.7 g/dL — ABNORMAL LOW (ref 3.5–5.0)
Alkaline Phosphatase: 212 U/L — ABNORMAL HIGH (ref 38–126)
Anion gap: 10 (ref 5–15)
BUN: 12 mg/dL (ref 6–20)
CO2: 23 mmol/L (ref 22–32)
Calcium: 8.5 mg/dL — ABNORMAL LOW (ref 8.9–10.3)
Chloride: 98 mmol/L (ref 98–111)
Creatinine, Ser: 0.71 mg/dL (ref 0.44–1.00)
GFR, Estimated: >60 mL/min (ref >60)
Glucose, Bld: 88 mg/dL (ref 70–99)
Potassium: 5.3 mmol/L — ABNORMAL HIGH (ref 3.5–5.1)
Sodium: 131 mmol/L — ABNORMAL LOW (ref 135–145)
Total Bilirubin: 8.4 mg/dL — ABNORMAL HIGH (ref 0.3–1.2)
Total Protein: 5.2 g/dL — ABNORMAL LOW (ref 6.5–8.1)

## 2022-07-02 LAB — CULTURE, BLOOD (ROUTINE X 2): Special Requests: ADEQUATE

## 2022-07-02 LAB — CBC
HCT: 36.8 % (ref 36.0–46.0)
Hemoglobin: 11.6 g/dL — ABNORMAL LOW (ref 12.0–15.0)
MCH: 28 pg (ref 26.0–34.0)
MCHC: 31.5 g/dL (ref 30.0–36.0)
MCV: 88.9 fL (ref 80.0–100.0)
Platelets: 164 10*3/uL (ref 150–400)
RBC: 4.14 MIL/uL (ref 3.87–5.11)
WBC: 12.6 10*3/uL — ABNORMAL HIGH (ref 4.0–10.5)
nRBC: 0 % (ref 0.0–0.2)

## 2022-07-02 LAB — GLUCOSE, CAPILLARY
Glucose-Capillary: 99 mg/dL (ref 70–99)
Glucose-Capillary: 89 mg/dL (ref 70–99)
Glucose-Capillary: 153 mg/dL — ABNORMAL HIGH (ref 70–99)
Glucose-Capillary: 187 mg/dL — ABNORMAL HIGH (ref 70–99)

## 2022-07-02 LAB — PROTIME-INR
INR: 1.4 — ABNORMAL HIGH (ref 0.8–1.2)
Prothrombin Time: 16.5 seconds — ABNORMAL HIGH (ref 11.4–15.2)

## 2022-07-02 SURGERY — AMPUTATION BELOW KNEE
Anesthesia: General | Site: Knee | Laterality: Right

## 2022-07-02 MED ORDER — MIDAZOLAM HCL 2 MG/2ML IJ SOLN
INTRAMUSCULAR | Status: AC
  Filled 2022-07-02: qty 2

## 2022-07-02 MED ORDER — MAGNESIUM CITRATE PO SOLN: 1.0000 | Freq: Once | ORAL | Status: DC | PRN

## 2022-07-02 MED ORDER — ZINC SULFATE 220 (50 ZN) MG PO CAPS
220.0000 mg | ORAL_CAPSULE | Freq: Every day | ORAL | Status: DC
  Administered 2022-07-02 – 2022-07-05 (×4): 220 mg via ORAL
  Filled 2022-07-02 (×4): qty 1

## 2022-07-02 MED ORDER — FENTANYL CITRATE (PF) 250 MCG/5ML IJ SOLN
INTRAMUSCULAR | Status: AC
  Filled 2022-07-02: qty 5

## 2022-07-02 MED ORDER — LACTATED RINGERS IV SOLN: INTRAVENOUS | Status: DC | PRN

## 2022-07-02 MED ORDER — LIDOCAINE 2% (20 MG/ML) 5 ML SYRINGE
INTRAMUSCULAR | Status: AC
  Filled 2022-07-02: qty 5

## 2022-07-02 MED ORDER — ONDANSETRON HCL 4 MG/2ML IJ SOLN
INTRAMUSCULAR | Status: DC | PRN
  Administered 2022-07-02: 4 mg via INTRAVENOUS

## 2022-07-02 MED ORDER — FENTANYL CITRATE (PF) 100 MCG/2ML IJ SOLN
25.0000 ug | INTRAMUSCULAR | Status: DC | PRN
  Administered 2022-07-02 (×3): 50 ug via INTRAVENOUS

## 2022-07-02 MED ORDER — ONDANSETRON HCL 4 MG/2ML IJ SOLN
INTRAMUSCULAR | Status: AC
  Filled 2022-07-02: qty 2

## 2022-07-02 MED ORDER — MAGNESIUM SULFATE 2 GM/50ML IV SOLN: 2.0000 g | Freq: Every day | INTRAVENOUS | Status: DC | PRN

## 2022-07-02 MED ORDER — LABETALOL HCL 5 MG/ML IV SOLN: 10.0000 mg | INTRAVENOUS | Status: DC | PRN

## 2022-07-02 MED ORDER — ROCURONIUM BROMIDE 10 MG/ML (PF) SYRINGE
PREFILLED_SYRINGE | INTRAVENOUS | Status: DC | PRN
  Administered 2022-07-02: 60 mg via INTRAVENOUS

## 2022-07-02 MED ORDER — POTASSIUM CHLORIDE CRYS ER 20 MEQ PO TBCR: 20.0000 meq | EXTENDED_RELEASE_TABLET | Freq: Every day | ORAL | Status: DC | PRN

## 2022-07-02 MED ORDER — JUVEN PO PACK
1.0000 | PACK | Freq: Two times a day (BID) | ORAL | Status: DC
  Administered 2022-07-02 – 2022-07-05 (×7): 1 via ORAL
  Filled 2022-07-02 (×7): qty 1

## 2022-07-02 MED ORDER — PROPOFOL 10 MG/ML IV BOLUS
INTRAVENOUS | Status: AC
  Filled 2022-07-02: qty 20

## 2022-07-02 MED ORDER — OXYCODONE HCL 5 MG PO TABS
ORAL_TABLET | ORAL | Status: AC
  Filled 2022-07-02: qty 1

## 2022-07-02 MED ORDER — DEXAMETHASONE SODIUM PHOSPHATE 10 MG/ML IJ SOLN
INTRAMUSCULAR | Status: DC | PRN
  Administered 2022-07-02: 5 mg via INTRAVENOUS

## 2022-07-02 MED ORDER — DEXAMETHASONE SODIUM PHOSPHATE 10 MG/ML IJ SOLN
INTRAMUSCULAR | Status: AC
  Filled 2022-07-02: qty 1

## 2022-07-02 MED ORDER — ACETAMINOPHEN 325 MG PO TABS: 325.0000 mg | ORAL_TABLET | Freq: Four times a day (QID) | ORAL | Status: DC | PRN

## 2022-07-02 MED ORDER — OXYCODONE HCL 5 MG PO TABS: 5.0000 mg | ORAL_TABLET | ORAL | Status: DC | PRN

## 2022-07-02 MED ORDER — METOPROLOL TARTRATE 5 MG/5ML IV SOLN: 2.0000 mg | INTRAVENOUS | Status: DC | PRN

## 2022-07-02 MED ORDER — SUGAMMADEX SODIUM 200 MG/2ML IV SOLN
INTRAVENOUS | Status: DC | PRN
  Administered 2022-07-02: 200 mg via INTRAVENOUS

## 2022-07-02 MED ORDER — OXYCODONE HCL 5 MG/5ML PO SOLN: 5.0000 mg | Freq: Once | ORAL | Status: AC | PRN

## 2022-07-02 MED ORDER — PROPOFOL 10 MG/ML IV BOLUS
INTRAVENOUS | Status: DC | PRN
  Administered 2022-07-02: 140 mg via INTRAVENOUS

## 2022-07-02 MED ORDER — BISACODYL 5 MG PO TBEC: 5.0000 mg | DELAYED_RELEASE_TABLET | Freq: Every day | ORAL | Status: DC | PRN

## 2022-07-02 MED ORDER — ROCURONIUM BROMIDE 10 MG/ML (PF) SYRINGE
PREFILLED_SYRINGE | INTRAVENOUS | Status: AC
  Filled 2022-07-02: qty 10

## 2022-07-02 MED ORDER — PHENYLEPHRINE 80 MCG/ML (10ML) SYRINGE FOR IV PUSH (FOR BLOOD PRESSURE SUPPORT)
PREFILLED_SYRINGE | INTRAVENOUS | Status: AC
  Filled 2022-07-02: qty 10

## 2022-07-02 MED ORDER — ONDANSETRON HCL 4 MG/2ML IJ SOLN: 4.0000 mg | Freq: Four times a day (QID) | INTRAMUSCULAR | Status: DC | PRN

## 2022-07-02 MED ORDER — GUAIFENESIN-DM 100-10 MG/5ML PO SYRP: 15.0000 mL | ORAL_SOLUTION | ORAL | Status: DC | PRN

## 2022-07-02 MED ORDER — OXYCODONE HCL 5 MG PO TABS: 10.0000 mg | ORAL_TABLET | ORAL | Status: DC | PRN

## 2022-07-02 MED ORDER — OXYCODONE HCL 5 MG PO TABS
5.0000 mg | ORAL_TABLET | Freq: Once | ORAL | Status: AC | PRN
  Administered 2022-07-02: 5 mg via ORAL

## 2022-07-02 MED ORDER — ALUM & MAG HYDROXIDE-SIMETH 200-200-20 MG/5ML PO SUSP
15.0000 mL | ORAL | Status: DC | PRN
  Administered 2022-07-04: 30 mL via ORAL
  Filled 2022-07-02: qty 30

## 2022-07-02 MED ORDER — VITAMIN C 500 MG PO TABS
1000.0000 mg | ORAL_TABLET | Freq: Every day | ORAL | Status: DC
  Administered 2022-07-02 – 2022-07-05 (×4): 1000 mg via ORAL
  Filled 2022-07-02 (×4): qty 2

## 2022-07-02 MED ORDER — FENTANYL CITRATE (PF) 100 MCG/2ML IJ SOLN
INTRAMUSCULAR | Status: AC
  Filled 2022-07-02: qty 2

## 2022-07-02 MED ORDER — SUCCINYLCHOLINE CHLORIDE 200 MG/10ML IV SOSY
PREFILLED_SYRINGE | INTRAVENOUS | Status: AC
  Filled 2022-07-02: qty 10

## 2022-07-02 MED ORDER — PHENYLEPHRINE 80 MCG/ML (10ML) SYRINGE FOR IV PUSH (FOR BLOOD PRESSURE SUPPORT)
PREFILLED_SYRINGE | INTRAVENOUS | Status: DC | PRN
  Administered 2022-07-02: 160 ug via INTRAVENOUS

## 2022-07-02 MED ORDER — LIDOCAINE 2% (20 MG/ML) 5 ML SYRINGE
INTRAMUSCULAR | Status: DC | PRN
  Administered 2022-07-02: 60 mg via INTRAVENOUS

## 2022-07-02 MED ORDER — HYDROMORPHONE HCL 1 MG/ML IJ SOLN
0.5000 mg | INTRAMUSCULAR | Status: DC | PRN
  Administered 2022-07-02 – 2022-07-04 (×10): 1 mg via INTRAVENOUS
  Filled 2022-07-02 (×10): qty 1

## 2022-07-02 MED ORDER — 0.9 % SODIUM CHLORIDE (POUR BTL) OPTIME
TOPICAL | Status: DC | PRN
  Administered 2022-07-02: 1000 mL

## 2022-07-02 MED ORDER — DOCUSATE SODIUM 100 MG PO CAPS
100.0000 mg | ORAL_CAPSULE | Freq: Every day | ORAL | Status: DC
  Administered 2022-07-02 – 2022-07-03 (×2): 100 mg via ORAL
  Filled 2022-07-02 (×3): qty 1

## 2022-07-02 MED ORDER — PHENOL 1.4 % MT LIQD: 1.0000 | OROMUCOSAL | Status: DC | PRN

## 2022-07-02 MED ORDER — MIDAZOLAM HCL 2 MG/2ML IJ SOLN
INTRAMUSCULAR | Status: DC | PRN
  Administered 2022-07-02 (×2): 1 mg via INTRAVENOUS

## 2022-07-02 MED ORDER — FENTANYL CITRATE (PF) 250 MCG/5ML IJ SOLN
INTRAMUSCULAR | Status: DC | PRN
  Administered 2022-07-02: 25 ug via INTRAVENOUS
  Administered 2022-07-02: 100 ug via INTRAVENOUS

## 2022-07-02 MED ORDER — PANTOPRAZOLE SODIUM 40 MG PO TBEC: 40.0000 mg | DELAYED_RELEASE_TABLET | Freq: Every day | ORAL | Status: DC

## 2022-07-02 MED ORDER — POLYETHYLENE GLYCOL 3350 17 G PO PACK: 17.0000 g | PACK | Freq: Every day | ORAL | Status: DC | PRN

## 2022-07-02 MED ORDER — HYDRALAZINE HCL 20 MG/ML IJ SOLN: 5.0000 mg | INTRAMUSCULAR | Status: DC | PRN

## 2022-07-02 MED ORDER — SODIUM CHLORIDE 0.9 % IV SOLN: INTRAVENOUS | Status: DC

## 2022-07-02 SURGICAL SUPPLY — 60 items
BAG COUNTER SPONGE SURGICOUNT (BAG) ×2 IMPLANT
BAG SPNG CNTER NS LX DISP (BAG) ×2
BLADE SAW RECIP 87.9 MT (BLADE) IMPLANT
BLADE SURG 21 STRL SS (BLADE) ×2 IMPLANT
BNDG COHESIVE 6X5 TAN STRL LF (GAUZE/BANDAGES/DRESSINGS) IMPLANT
CANISTER SUCT 3000ML PPV (MISCELLANEOUS) ×2 IMPLANT
CANISTER WOUND CARE 500ML ATS (WOUND CARE) ×2 IMPLANT
CANISTER WOUNDNEG PRESSURE 500 (CANNISTER) IMPLANT
COVER SURGICAL LIGHT HANDLE (MISCELLANEOUS) ×2 IMPLANT
CUFF TOURN SGL QUICK 34 (TOURNIQUET CUFF) ×2
CUFF TRNQT CYL 34X4.125X (TOURNIQUET CUFF) ×2 IMPLANT
DRAPE DERMATAC (DRAPES) IMPLANT
DRAPE INCISE IOBAN 66X45 STRL (DRAPES) ×2 IMPLANT
DRAPE LAPAROSCOPIC ABDOMINAL (DRAPES) IMPLANT
DRAPE LAPAROTOMY 100X72 PEDS (DRAPES) IMPLANT
DRAPE U-SHAPE 47X51 STRL (DRAPES) ×2 IMPLANT
DRESSING PEEL AND PLC PRVNA 13 (GAUZE/BANDAGES/DRESSINGS) IMPLANT
DRSG PEEL AND PLACE PREVENA 13 (GAUZE/BANDAGES/DRESSINGS) ×6
DRSG VAC ATS LRG SENSATRAC (GAUZE/BANDAGES/DRESSINGS) IMPLANT
DRSG VAC ATS MED SENSATRAC (GAUZE/BANDAGES/DRESSINGS) IMPLANT
DRSG VAC ATS SM SENSATRAC (GAUZE/BANDAGES/DRESSINGS) IMPLANT
DRSG VERSA FOAM LRG 10X15 (GAUZE/BANDAGES/DRESSINGS) IMPLANT
DURAPREP 26ML APPLICATOR (WOUND CARE) ×2 IMPLANT
ELECT REM PT RETURN 9FT ADLT (ELECTROSURGICAL) ×2
ELECTRODE REM PT RTRN 9FT ADLT (ELECTROSURGICAL) ×2 IMPLANT
GLOVE BIOGEL M STRL SZ7.5 (GLOVE) ×2 IMPLANT
GLOVE BIOGEL PI IND STRL 9 (GLOVE) ×2 IMPLANT
GLOVE INDICATOR 8.0 STRL GRN (GLOVE) ×4 IMPLANT
GLOVE SURG ORTHO 9.0 STRL STRW (GLOVE) ×2 IMPLANT
GOWN STRL REUS W/ TWL LRG LVL3 (GOWN DISPOSABLE) ×4 IMPLANT
GOWN STRL REUS W/ TWL XL LVL3 (GOWN DISPOSABLE) ×4 IMPLANT
GOWN STRL REUS W/TWL 2XL LVL3 (GOWN DISPOSABLE) ×2 IMPLANT
GOWN STRL REUS W/TWL LRG LVL3 (GOWN DISPOSABLE) ×4
GOWN STRL REUS W/TWL XL LVL3 (GOWN DISPOSABLE) ×4
GRAFT SKIN WND MICRO 38 (Tissue) IMPLANT
GRAFT SKIN WND OMEGA3 SB 7X10 (Tissue) IMPLANT
HANDLE SUCTION POOLE (INSTRUMENTS) IMPLANT
KIT BASIN OR (CUSTOM PROCEDURE TRAY) ×2 IMPLANT
KIT TURNOVER KIT B (KITS) ×2 IMPLANT
MANIFOLD NEPTUNE II (INSTRUMENTS) ×2 IMPLANT
NS IRRIG 1000ML POUR BTL (IV SOLUTION) ×2 IMPLANT
PACK GENERAL/GYN (CUSTOM PROCEDURE TRAY) ×2 IMPLANT
PACK ORTHO EXTREMITY (CUSTOM PROCEDURE TRAY) ×2 IMPLANT
PAD ARMBOARD 7.5X6 YLW CONV (MISCELLANEOUS) ×2 IMPLANT
PENCIL SMOKE EVACUATOR (MISCELLANEOUS) ×2 IMPLANT
PREVENA RESTOR ARTHOFORM 33X30 (CANNISTER) IMPLANT
PREVENA RESTOR ARTHOFORM 46X30 (CANNISTER) ×2 IMPLANT
SPONGE ABDOMINAL VAC ABTHERA (MISCELLANEOUS) IMPLANT
SPONGE T-LAP 18X18 ~~LOC~~+RFID (SPONGE) IMPLANT
STAPLER VISISTAT 35W (STAPLE) IMPLANT
STOCKINETTE IMPERVIOUS LG (DRAPES) ×2 IMPLANT
SUCTION POOLE HANDLE (INSTRUMENTS)
SUT ETHILON 2 0 PSLX (SUTURE) IMPLANT
SUT SILK 2 0 (SUTURE)
SUT SILK 2-0 18XBRD TIE 12 (SUTURE) ×2 IMPLANT
SUT VIC AB 1 CTX 27 (SUTURE) ×4 IMPLANT
TOWEL GREEN STERILE (TOWEL DISPOSABLE) ×2 IMPLANT
TOWEL GREEN STERILE FF (TOWEL DISPOSABLE) ×2 IMPLANT
TUBE CONNECTING 12X1/4 (SUCTIONS) ×2 IMPLANT
YANKAUER SUCT BULB TIP NO VENT (SUCTIONS) ×2 IMPLANT

## 2022-07-02 NOTE — Hospital Course (Signed)
Tylenol, klonopin, daptomycin, fluoxetine, dilaudid

## 2022-07-02 NOTE — Progress Notes (Signed)
Inpatient Rehabilitation Admissions Coordinator   Rehab consult received.  I await therapy evals to assist with planning dispo options.  Danne Baxter, RN, MSN Rehab Admissions Coordinator [phone] 07/02/2022 12:34 PM

## 2022-07-02 NOTE — Progress Notes (Signed)
Orthopedic Tech Progress Note Patient Details:  Briana Sweeney 04-Oct-1970 [id number]  Called in order to [employer] for a VIVE PROTOCOL BK   Patient ID: Briana Sweeney, female   DOB: 07-07-1971, 51 y.o.   MRN: [id number]  Briana Sweeney 07/02/2022, 9:20 AM

## 2022-07-02 NOTE — Anesthesia Preprocedure Evaluation (Signed)
Anesthesia Evaluation  Patient identified by MRN, date of birth, ID band Patient awake    Reviewed: Allergy & Precautions, H&P , NPO status , Patient's Chart, lab work & pertinent test results  Airway Mallampati: II   Neck ROM: full    Dental   Pulmonary neg pulmonary ROS   breath sounds clear to auscultation       Cardiovascular + Peripheral Vascular Disease   Rhythm:regular Rate:Normal     Neuro/Psych  Headaches, Seizures -,  PSYCHIATRIC DISORDERS Anxiety Depression Bipolar Disorder Schizophrenia   Neuromuscular disease    GI/Hepatic ,GERD  ,,(+)     substance abuse  cocaine use and IV drug use, Hepatitis -, C  Endo/Other    Renal/GU      Musculoskeletal  (+)  narcotic dependent  Abdominal   Peds  Hematology  (+) Blood dyscrasia, anemia   Anesthesia Other Findings   Reproductive/Obstetrics                             Anesthesia Physical Anesthesia Plan  ASA: 3  Anesthesia Plan: General   Post-op Pain Management:    Induction: Intravenous  PONV Risk Score and Plan: 3 and Ondansetron, Dexamethasone, Midazolam and Treatment may vary due to age or medical condition  Airway Management Planned: Oral ETT  Additional Equipment:   Intra-op Plan:   Post-operative Plan: Extubation in OR  Informed Consent: I have reviewed the patients History and Physical, chart, labs and discussed the procedure including the risks, benefits and alternatives for the proposed anesthesia with the patient or authorized representative who has indicated his/her understanding and acceptance.     Dental advisory given  Plan Discussed with: CRNA, Anesthesiologist and Surgeon  Anesthesia Plan Comments:        Anesthesia Quick Evaluation

## 2022-07-02 NOTE — Progress Notes (Signed)
Subjective:   Patient endorses burning pain in her right lower extremity.  She is unsure of what surgery she had done on her right leg.  Did not discuss any further given her anesthesia is still wearing off.  Objective:  Vital signs in last 24 hours: Vitals:   07/02/22 0930 07/02/22 0945 07/02/22 1000 07/02/22 1020  BP: (!) 132/94 (!) 143/86 139/87 (!) 126/93  Pulse: (!) 116 (!) 116 (!) 115 (!) 101  Resp: '15 20 19 18  '$ Temp:   98.6 F (37 C) 98.8 F (37.1 C)  TempSrc:      SpO2: 94% 94% 94% 96%  Weight:      Height:       Physical Exam  Constitutional: well-nourished, and in no distress.  Drowsy after recent anesthesia. HEENT: Scleral icterus Pulmonary: Non labored breathing on 2L Abdomen: soft, distended, nontender Neurological: Alert and oriented to person, place, and time.  Skin: Skin is warm and dry. Yellowing of the skin diffusely.  Right BKA with stump shrinker. Neuro: Alert and oriented x3     Latest Ref Rng & Units 07/02/2022    4:57 AM 07/01/2022    3:27 AM 06/30/2022    3:11 AM  CBC  WBC 4.0 - 10.5 K/uL 12.6  14.5  18.3   Hemoglobin 12.0 - 15.0 g/dL 11.6  11.2  12.4   Hematocrit 36.0 - 46.0 % 36.8  35.4  41.7   Platelets 150 - 400 K/uL 164  183  208       Latest Ref Rng & Units 07/02/2022    4:57 AM 07/01/2022    3:27 AM 06/30/2022    4:33 PM  CMP  Glucose 70 - 99 mg/dL 88  119  99   BUN 6 - 20 mg/dL 12  21  34   Creatinine 0.44 - 1.00 mg/dL 0.71  1.11  1.66   Sodium 135 - 145 mmol/L 131  133  132   Potassium 3.5 - 5.1 mmol/L 5.3  4.3  4.6   Chloride 98 - 111 mmol/L 98  99  100   CO2 22 - 32 mmol/L '23  22  20   '$ Calcium 8.9 - 10.3 mg/dL 8.5  8.2  8.0   Total Protein 6.5 - 8.1 g/dL 5.2  4.9    Total Bilirubin 0.3 - 1.2 mg/dL 8.4  7.1    Alkaline Phos 38 - 126 U/L 212  164    AST 15 - 41 U/L 39  29    ALT 0 - 44 U/L 20  22     Assessment/Plan:  Principal Problem:   MRSA bacteremia Active Problems:   Acute renal failure superimposed on stage  3a chronic kidney disease (HCC)   Severe protein-calorie malnutrition (HCC)   Cellulitis of right foot   Hepatic cirrhosis (HCC)   Sepsis (HCC)   Foot osteomyelitis, right (HCC)   Cutaneous abscess of right foot  Ms. 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 Tachycardia without hypotension. Continues to be afebrile. Her leukocytosis continues  to improve.  T bili slightly increased, sCr improving. Blood cultures with GPC.  Patient is on daptomycin.  Will repeat blood cultures today.   -Infectious disease following -Follow-up repeat blood cultures -Oxycodone and hydromorphone for pain control  -Daily CBC -Follow-up with orthopedics 1 week after discharge  #Acute renal failure  Likely prerenal though also potential for hepatorenal syndrome. sCr improving with IVF.Receiving NS 75 ml/hr for 24 hrs. -IVF as needed  #Decompensated cirrhosis 2/2 to alcohol use c/b esophageal varices  #Treated Hepatitis C (recent Hep C RNA negative) Patient notes that she receives monthly paracenteses. Skin is jaundiced, sclera icteric.  Baseline mentation.  T bili slightly worsened today. On admission, INR 1.4.  Her MELD Na is 26, 19.6% 3 month mortality, Child Pugh Class C. Abdomen distended today. Appears stable clinically. Will continue to monitor for abdominal discomfort, patient may require small volume paracentesis while inpatient. -Continue to hold nadolol, Coreg, Lasix,  spironolactone -Continue lactulose 20 g 3 times daily -Continue to monitor patient's renal function  #History of seizures Patient reports that she has a history of seizures since 51 years old.  -Keppra 750 mg twice daily  #Anxiety  Continue patient's home medications.  No dose adjustments needed for her klonopin in the setting of her renal failure or hepatic dysfunction -klonopin 0.5 mg twice daily as needed   #History of thrombocytopenia Patient has received Nplate in the past.  Her platelet counts are normal this hospitalization. -Continue to monitor   Prior to Admission Living Arrangement: Living independently  Anticipated Discharge Location: Home  Barriers to Discharge: None Dispo: Anticipated discharge in approximately 5 day(s).   Linward Natal, MD 07/02/2022, 1:33 PM After 5pm on weekdays and 1pm on weekends: On Call pager [phone]

## 2022-07-02 NOTE — Interval H&P Note (Signed)
History and Physical Interval Note:  07/02/2022 6:38 AM  Briana Sweeney  has presented today for surgery, with the diagnosis of osteomyelitis and abscess right foot.  The various methods of treatment have been discussed with the patient and family. After consideration of risks, benefits and other options for treatment, the patient has consented to  Procedure(s): RIGHT BELOW KNEE AMPUTATION (Right) APPLICATION OF WOUND VAC (Right) as a surgical intervention.  The patient's history has been reviewed, patient examined, no change in status, stable for surgery.  I have reviewed the patient's chart and labs.  Questions were answered to the patient's satisfaction.     Newt Minion

## 2022-07-02 NOTE — Progress Notes (Signed)
Physical Therapy Evaluation Patient Details Name: Briana Sweeney MRN: [id number] DOB: May 06, 1971 Today's Date: 07/02/2022  History of Present Illness  51 y.o. female admitted 10/21 with Cellulitis and osteomyelitis of right foot, Acute renal failure superimposed on stage 3a chronic kidney disease, Severe protein-calorie malnutrition, Hepatic cirrhosis. 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  Clinical Impression  Patient is s/p above surgery resulting in functional limitations due to the deficits listed below (see PT Problem List). Post-op day 0 patient practiced bed mobility with min-mod assist to reach EOB. Practiced seated balance reaching towards limits of stability, showing good trunk control. Sit<>stand transfer with RW, min assist, tolerated upright stance x20 seconds. Patient lives alone but demonstrates great potential to return home at [facility] level with appropriate short-term comprehensive rehab from [facility].  We will continue to progress her level of mobility and independence while admitted to acute rehab services. Patient will benefit from skilled PT to increase their independence and safety with mobility to allow discharge to the venue listed below.          Recommendations for follow up therapy are one component of a multi-disciplinary discharge planning process, led by the attending physician.  Recommendations may be updated based on patient status, additional functional criteria and insurance authorization.  Follow Up Recommendations Acute inpatient rehab (3hours/day)      Assistance Recommended at Discharge Other (comment) (TBD)  Patient can return home with the following  A lot of help with walking and/or transfers;A lot of help with bathing/dressing/bathroom;Assistance with cooking/housework;Assist for transportation    Equipment Recommendations  (TBD next venue of care)  Recommendations for Other Services  Rehab consult    Functional Status Assessment Patient has had a recent decline in  their functional status and demonstrates the ability to make significant improvements in function in a reasonable and predictable amount of time.     Precautions / Restrictions Precautions Precautions: Knee (Rt BKA) Precaution Comments: Reviewed verbally Required Braces or Orthoses:  (Rt residual limb protector.) Restrictions Weight Bearing Restrictions: Yes RLE Weight Bearing: Non weight bearing      Mobility  Bed Mobility Overal bed mobility: Needs Assistance Bed Mobility: Rolling, Sidelying to Sit, Sit to Sidelying Rolling: Min assist Sidelying to sit: Mod assist     Sit to sidelying: Min assist General bed mobility comments: Educated on bed mobility techniques including rolling with min assist, use of hand rail as needed, Mod assist for trunk and RLE support to EOB. Pt able to scoot to EOB in seated position with min assist, and slide self up in bed with min assist using bridge with LLE to push and UEs to pull HOB rail.    Transfers Overall transfer level: Needs assistance Equipment used: Rolling walker (2 wheels) Transfers: Sit to/from Stand Sit to Stand: Min assist           General transfer comment: Min assist for boost and balance to rise from EOB on low bed setting. Cues for technique, and hand placement. Tolerated standing approx 20 seconds.    Ambulation/Gait                  Stairs            Wheelchair Mobility    Modified Rankin (Stroke Patients Only)       Balance Overall balance assessment: Needs assistance Sitting-balance support: No upper extremity supported, Feet supported Sitting balance-Leahy Scale: Good Sitting balance - Comments: Able to reach forward and lateral, across chest with fair range, pulling self  back to midline without assistance.   Standing balance support: Bilateral upper extremity supported, Reliant on assistive device for balance Standing balance-Leahy Scale: Poor                                Pertinent Vitals/Pain Pain Assessment Pain Assessment: 0-10 Pain Score: 10-Worst pain ever Pain Location: Rt residual limb Pain Descriptors / Indicators: Burning, Aching, Constant Pain Intervention(s): Limited activity within patient's tolerance, Monitored during session, Repositioned    Home Living Family/patient expects to be discharged to:: Private residence Living Arrangements: Alone Available Help at Discharge: Neighbor;Available PRN/intermittently Type of Home: Apartment Home Access: Elevator       Home Layout: One level Home Equipment: Grab bars - tub/shower;Grab bars - toilet;Wheelchair - manual      Prior Function Prior Level of Function : Independent/Modified Independent             Mobility Comments: several falls reported after Rt foot infection       Hand Dominance   Dominant Hand: Right    Extremity/Trunk Assessment   Upper Extremity Assessment Upper Extremity Assessment: Defer to OT evaluation    Lower Extremity Assessment Lower Extremity Assessment: RLE deficits/detail RLE Deficits / Details: post op BKA  - weakness, guarding as expected RLE: Unable to fully assess due to pain       Communication   Communication: No difficulties  Cognition Arousal/Alertness: Lethargic, Suspect due to medications Behavior During Therapy: WFL for tasks assessed/performed Overall Cognitive Status: Within Functional Limits for tasks assessed                                          General Comments General comments (skin integrity, edema, etc.): Educated on RLE positioning, role of PT, dispo planning, and gentle exercises.    Exercises General Exercises - Lower Extremity Ankle Circles/Pumps: AROM, Left, 10 reps, Supine Quad Sets: Strengthening, Both, 5 reps, Supine   Assessment/Plan    PT Assessment Patient needs continued PT services  PT Problem List Decreased strength;Decreased range of motion;Decreased activity  tolerance;Decreased balance;Decreased mobility;Decreased knowledge of use of DME;Decreased knowledge of precautions;Pain       PT Treatment Interventions DME instruction;Gait training;Functional mobility training;Therapeutic activities;Therapeutic exercise;Balance training;Neuromuscular re-education;Patient/family education;Modalities;Wheelchair mobility training    PT Goals (Current goals can be found in the Care Plan section)  Acute Rehab PT Goals Patient Stated Goal: Get some rehab before going home PT Goal Formulation: With patient Time For Goal Achievement: 07/16/22 Potential to Achieve Goals: Good    Frequency Min 4X/week     Co-evaluation               AM-PAC PT "6 Clicks" Mobility  Outcome Measure Help needed turning from your back to your side while in a flat bed without using bedrails?: A Little Help needed moving from lying on your back to sitting on the side of a flat bed without using bedrails?: A Little Help needed moving to and from a bed to a chair (including a wheelchair)?: A Little Help needed standing up from a chair using your arms (e.g., wheelchair or bedside chair)?: A Little Help needed to walk in hospital room?: Total Help needed climbing 3-5 steps with a railing? : Total 6 Click Score: 14    End of Session Equipment Utilized During Treatment: Gait belt;Oxygen;Other (comment) (limb  protector Rt) Activity Tolerance: Patient tolerated treatment well Patient left: in bed;with call bell/phone within reach;with bed alarm set Nurse Communication: Mobility status PT Visit Diagnosis: History of falling (Z91.81);Difficulty in walking, not elsewhere classified (R26.2);Pain;Unsteadiness on feet (R26.81);Other abnormalities of gait and mobility (R26.89) Pain - Right/Left: Right Pain - part of body: Leg (at surgical site)    Time: 4997-1820 PT Time Calculation (min) (ACUTE ONLY): 28 min   Charges:   PT Evaluation $PT Eval Moderate Complexity: 1 Mod PT  Treatments $Therapeutic Activity: 8-22 mins        Candie Mile, PT, DPT Physical Therapist Acute [facility] [place] 07/02/2022, 2:02 PM

## 2022-07-02 NOTE — Anesthesia Procedure Notes (Addendum)
Procedure Name: Intubation Date/Time: 07/02/2022 7:42 AM  Performed by: Janene Harvey, CRNAPre-anesthesia Checklist: Patient identified, Emergency Drugs available, Suction available and Patient being monitored Patient Re-evaluated:Patient Re-evaluated prior to induction Oxygen Delivery Method: Circle system utilized Preoxygenation: Pre-oxygenation with 100% oxygen Induction Type: IV induction Ventilation: Mask ventilation without difficulty Laryngoscope Size: Mac and 3 Grade View: Grade I Tube type: Oral Tube size: 7.0 mm Number of attempts: 1 Airway Equipment and Method: Stylet and Oral airway Placement Confirmation: ETT inserted through vocal cords under direct vision, positive ETCO2 and breath sounds checked- equal and bilateral Secured at: 21 cm Tube secured with: Tape Dental Injury: Teeth and Oropharynx as per pre-operative assessment  Comments: Intubation by srna

## 2022-07-02 NOTE — Op Note (Signed)
07/02/2022  8:31 AM  PATIENT:  Briana Sweeney    PRE-OPERATIVE DIAGNOSIS:  osteomyelitis and abscess right foot  POST-OPERATIVE DIAGNOSIS:  Same  PROCEDURE:  RIGHT BELOW KNEE AMPUTATION,  APPLICATION OF WOUND VAC Prevena customizable and [place] arthroform. Application of Kerecis 7 x 10 sheet and 38 cm of micro powder to cover a wound surface area of 200 cm.   SURGEON:  Newt Minion, MD  ANESTHESIA:   General  PREOPERATIVE INDICATIONS:  Briana Sweeney is a  51 y.o. female with a diagnosis of osteomyelitis and abscess right foot who failed conservative measures and elected for surgical management.    The risks benefits and alternatives were discussed with the patient preoperatively including but not limited to the risks of infection, bleeding, nerve injury, cardiopulmonary complications, the need for revision surgery, among others, and the patient was willing to proceed.  OPERATIVE IMPLANTS: Kerecis micro graft 38 cm and Kerecis sheet 7 x 10 cm.   OPERATIVE FINDINGS: Tissue margins were clear good petechial bleeding.  OPERATIVE PROCEDURE: Patient was brought to the operating room after undergoing a regional anesthetic.  After adequate levels anesthesia were obtained a thigh tourniquet was placed and the lower extremity was prepped using DuraPrep draped into a sterile field. The foot was draped out of the sterile field with impervious stockinette.  A timeout was called and the tourniquet inflated.  A transverse skin incision was made 12 cm distal to the tibial tubercle, the incision curved proximally, and a large posterior flap was created.  The tibia was transected just proximal to the skin incision and beveled anteriorly.  The fibula was transected just proximal to the tibial incision.  The sciatic nerve was pulled cut and allowed to retract.  The vascular bundles were suture ligated with 2-0 silk.  The tourniquet was deflated and hemostasis obtained.    Drill holes were  placed through the tibia and fibula to secure the [facility] tissue graft and the gastrocnemius fascia.    The Kerecis micro powder 38 cm was applied to the open wound that has a 200 cm surface area.  The 7 x 10 cm Kerecis sheet was then folded and secured to the distal tibia and fibula with #1 Vicryl.  A separate drill hole was then used to secure the [facility] tissue graft and the gastrocnemius fascia to the dorsum of the tibia.    The deep and superficial fascial layers were closed using #1 Vicryl.  The skin was closed using staples.    The Prevena customizable dressing was applied this was overwrapped with the arthroform sponge.  Briana Sweeney was used to secure the sponges and the circumferential compression was secured to the skin with Dermatac.  This was connected to the wound VAC pump and had a good suction fit this was covered with a stump shrinker and a limb protector.  Patient was taken to the PACU in stable condition.   DISCHARGE PLANNING:  Antibiotic duration: 24-hour antibiotics  Weightbearing: Nonweightbearing on the operative extremity  Pain medication: Opioid pathway  Dressing care/ Wound VAC: Continue wound VAC with the Prevena plus pump at discharge for 1 week  Ambulatory devices: Walker or kneeling scooter  Discharge to: Discharge planning based on recommendations per physical therapy  Follow-up: In the office 1 week after discharge.

## 2022-07-02 NOTE — Transfer of Care (Signed)
Immediate Anesthesia Transfer of Care Note  Patient: Briana Sweeney  Procedure(s) Performed: RIGHT BELOW KNEE AMPUTATION (Right: Knee) APPLICATION OF WOUND VAC (Right)  Patient Location: PACU  Anesthesia Type:General  Level of Consciousness: drowsy and patient cooperative  Airway & Oxygen Therapy: Patient Spontanous Breathing and Patient connected to face mask oxygen  Post-op Assessment: Report given to RN and Post -op Vital signs reviewed and stable  Post vital signs: Reviewed and stable  Last Vitals:  Vitals Value Taken Time  BP 129/90 07/02/22 0835  Temp    Pulse 120 07/02/22 0838  Resp 9 07/02/22 0838  SpO2 100 % 07/02/22 0838  Vitals shown include unvalidated device data.  Last Pain:  Vitals:   07/02/22 0427  TempSrc: Oral  PainSc:          Complications: No notable events documented.

## 2022-07-02 NOTE — Anesthesia Postprocedure Evaluation (Signed)
Anesthesia Post Note  Patient: Briana Sweeney  Procedure(s) Performed: RIGHT BELOW KNEE AMPUTATION (Right: Knee) APPLICATION OF WOUND VAC (Right)     Patient location during evaluation: PACU Anesthesia Type: General Level of consciousness: awake and alert Pain management: pain level controlled Vital Signs Assessment: post-procedure vital signs reviewed and stable Respiratory status: spontaneous breathing, nonlabored ventilation, respiratory function stable and patient connected to nasal cannula oxygen Cardiovascular status: blood pressure returned to baseline and stable Postop Assessment: no apparent nausea or vomiting Anesthetic complications: no   No notable events documented.  Last Vitals:  Vitals:   07/02/22 1000 07/02/22 1020  BP: 139/87 (!) 126/93  Pulse: (!) 115 (!) 101  Resp: 19 18  Temp: 37 C 37.1 C  SpO2: 94% 96%    Last Pain:  Vitals:   07/02/22 1114  TempSrc:   PainSc: [place]

## 2022-07-03 ENCOUNTER — Encounter (HOSPITAL_COMMUNITY): Payer: Self-pay | Admitting: Orthopedic Surgery

## 2022-07-03 ENCOUNTER — Inpatient Hospital Stay: Payer: No Typology Code available for payment source

## 2022-07-03 DIAGNOSIS — B9562 Methicillin resistant Staphylococcus aureus infection as the cause of diseases classified elsewhere: Secondary | ICD-10-CM | POA: Diagnosis not present

## 2022-07-03 DIAGNOSIS — M86471 Chronic osteomyelitis with draining sinus, right ankle and foot: Secondary | ICD-10-CM | POA: Diagnosis not present

## 2022-07-03 DIAGNOSIS — R7881 Bacteremia: Secondary | ICD-10-CM | POA: Diagnosis not present

## 2022-07-03 DIAGNOSIS — K7031 Alcoholic cirrhosis of liver with ascites: Secondary | ICD-10-CM | POA: Diagnosis not present

## 2022-07-03 DIAGNOSIS — L03115 Cellulitis of right lower limb: Secondary | ICD-10-CM | POA: Diagnosis not present

## 2022-07-03 DIAGNOSIS — M60073 Infective myositis, right foot: Secondary | ICD-10-CM | POA: Diagnosis not present

## 2022-07-03 LAB — CBC
HCT: 34.3 % — ABNORMAL LOW (ref 36.0–46.0)
Hemoglobin: 11.4 g/dL — ABNORMAL LOW (ref 12.0–15.0)
MCH: 29.2 pg (ref 26.0–34.0)
MCHC: 33.2 g/dL (ref 30.0–36.0)
MCV: 87.9 fL (ref 80.0–100.0)
Platelets: 145 10*3/uL — ABNORMAL LOW (ref 150–400)
RBC: 3.9 MIL/uL (ref 3.87–5.11)
WBC: 9.8 10*3/uL (ref 4.0–10.5)
nRBC: 0 % (ref 0.0–0.2)

## 2022-07-03 LAB — COMPREHENSIVE METABOLIC PANEL
ALT: 22 U/L (ref 0–44)
AST: 37 U/L (ref 15–41)
Albumin: 1.6 g/dL — ABNORMAL LOW (ref 3.5–5.0)
Alkaline Phosphatase: 240 U/L — ABNORMAL HIGH (ref 38–126)
Anion gap: 8 (ref 5–15)
BUN: 13 mg/dL (ref 6–20)
CO2: 26 mmol/L (ref 22–32)
Calcium: 8.6 mg/dL — ABNORMAL LOW (ref 8.9–10.3)
Chloride: 100 mmol/L (ref 98–111)
Creatinine, Ser: 0.59 mg/dL (ref 0.44–1.00)
GFR, Estimated: >60 mL/min (ref >60)
Glucose, Bld: 188 mg/dL — ABNORMAL HIGH (ref 70–99)
Potassium: 4.7 mmol/L (ref 3.5–5.1)
Sodium: 134 mmol/L — ABNORMAL LOW (ref 135–145)
Total Bilirubin: 6.5 mg/dL — ABNORMAL HIGH (ref 0.3–1.2)
Total Protein: 5.3 g/dL — ABNORMAL LOW (ref 6.5–8.1)

## 2022-07-03 LAB — GLUCOSE, CAPILLARY
Glucose-Capillary: 141 mg/dL — ABNORMAL HIGH (ref 70–99)
Glucose-Capillary: 176 mg/dL — ABNORMAL HIGH (ref 70–99)

## 2022-07-03 LAB — CULTURE, BLOOD (ROUTINE X 2): Special Requests: ADEQUATE

## 2022-07-03 LAB — SURGICAL PATHOLOGY

## 2022-07-03 MED ORDER — FUROSEMIDE 40 MG PO TABS
40.0000 mg | ORAL_TABLET | Freq: Every day | ORAL | Status: DC
  Administered 2022-07-03 – 2022-07-05 (×3): 40 mg via ORAL
  Filled 2022-07-03 (×3): qty 1

## 2022-07-03 MED ORDER — NADOLOL 20 MG PO TABS: 20.0000 mg | ORAL_TABLET | Freq: Every morning | ORAL | Status: DC

## 2022-07-03 MED ORDER — NADOLOL 20 MG PO TABS
20.0000 mg | ORAL_TABLET | Freq: Every morning | ORAL | Status: DC
  Administered 2022-07-03 – 2022-07-05 (×3): 20 mg via ORAL
  Filled 2022-07-03 (×3): qty 1

## 2022-07-03 MED ORDER — SPIRONOLACTONE 25 MG PO TABS
100.0000 mg | ORAL_TABLET | Freq: Every day | ORAL | Status: DC
  Administered 2022-07-03 – 2022-07-05 (×3): 100 mg via ORAL
  Filled 2022-07-03 (×3): qty 4

## 2022-07-03 NOTE — Evaluation (Signed)
Occupational Therapy Evaluation Patient Details Name: Briana Sweeney MRN: [id number] DOB: 1971-02-28 Today's Date: 07/03/2022   History of Present Illness 51 y.o. female admitted 10/21 with Cellulitis and osteomyelitis of right foot, Acute renal failure superimposed on stage 3a chronic kidney disease, Severe protein-calorie malnutrition, Hepatic cirrhosis. 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   Clinical Impression   Patient admitted for the diagnosis and procedure above.  PTA she lives aline, and has a good Solicitor of friends that can assist her when she returns home.  Patient was independent at prior level of function, but now is requiring up to Mod A for basic stand pivots transfers at RW level, and is completing her ADL starting at bedlevel, then finishing from a seated level.  Patient would benefit from aggressive post acute rehab to regain her independence prior to returning home.  Patient can tolerate 3+ hours of rehab, thus AIR is recommended.  OT will continue efforts in the acute setting.        Recommendations for follow up therapy are one component of a multi-disciplinary discharge planning process, led by the attending physician.  Recommendations may be updated based on patient status, additional functional criteria and insurance authorization.   Follow Up Recommendations  Acute inpatient rehab (3hours/day)    Assistance Recommended at Discharge Frequent or constant Supervision/Assistance  Patient can return home with the following A lot of help with walking and/or transfers;A little help with bathing/dressing/bathroom;Assistance with cooking/housework;Help with stairs or ramp for entrance;Assist for transportation    Functional Status Assessment  Patient has had a recent decline in their functional status and demonstrates the ability to make significant improvements in function in a reasonable and predictable amount of time.  Equipment Recommendations  Wheelchair (measurements  OT);Wheelchair cushion (measurements OT);BSC/3in1    Recommendations for Other Services       Precautions / Restrictions Precautions Precautions: Fall Restrictions Weight Bearing Restrictions: Yes RLE Weight Bearing: Non weight bearing      Mobility Bed Mobility Overal bed mobility: Needs Assistance Bed Mobility: Rolling, Sidelying to Sit, Sit to Sidelying Rolling: Supervision Sidelying to sit: Min assist     Sit to sidelying: Min assist      Transfers Overall transfer level: Needs assistance Equipment used: Rolling walker (2 wheels) Transfers: Sit to/from Stand Sit to Stand: Min assist, Mod assist                  Balance Overall balance assessment: Needs assistance Sitting-balance support: No upper extremity supported, Feet supported Sitting balance-Leahy Scale: Good     Standing balance support: Bilateral upper extremity supported, Reliant on assistive device for balance Standing balance-Leahy Scale: Poor                             ADL either performed or assessed with clinical judgement   ADL Overall ADL's : Needs assistance/impaired Eating/Feeding: Independent;Bed level   Grooming: Wash/dry hands;Wash/dry face;Min guard;Sitting   Upper Body Bathing: Min guard;Sitting   Lower Body Bathing: +2 for safety/equipment;Bed level   Upper Body Dressing : Min guard;Sitting   Lower Body Dressing: Minimal assistance;Bed level   Toilet Transfer: Moderate assistance;Stand-pivot;BSC/3in1                   Vision Baseline Vision/History: 1 Wears glasses Patient Visual Report: No change from baseline       Perception Perception Perception: Within Functional Limits   Praxis Praxis Praxis: Intact    Pertinent  Vitals/Pain Pain Assessment Pain Assessment: Faces Faces Pain Scale: Hurts even more Pain Location: Rt residual limb Pain Descriptors / Indicators: Burning, Aching, Constant Pain Intervention(s): Monitored during session      Hand Dominance Right   Extremity/Trunk Assessment Upper Extremity Assessment Upper Extremity Assessment: Overall WFL for tasks assessed   Lower Extremity Assessment Lower Extremity Assessment: Defer to PT evaluation   Cervical / Trunk Assessment Cervical / Trunk Assessment: Other exceptions Cervical / Trunk Exceptions: BKA   Communication Communication Communication: No difficulties   Cognition Arousal/Alertness: Lethargic, Suspect due to medications Behavior During Therapy: WFL for tasks assessed/performed Overall Cognitive Status: Within Functional Limits for tasks assessed                                       General Comments       Exercises     Shoulder Instructions      Home Living Family/patient expects to be discharged to:: Private residence Living Arrangements: Alone Available Help at Discharge: Neighbor;Available PRN/intermittently   Home Access: Elevator     Home Layout: One level     Bathroom Shower/Tub: Teacher, early years/pre: Standard Bathroom Accessibility: Yes How Accessible: Accessible via walker Home Equipment: Grab bars - tub/shower;Grab bars - toilet;Wheelchair - manual          Prior Functioning/Environment Prior Level of Function : Independent/Modified Independent                        OT Problem List: Decreased strength;Decreased activity tolerance;Decreased safety awareness;Impaired balance (sitting and/or standing);Impaired sensation      OT Treatment/Interventions: Self-care/ADL training;Therapeutic exercise;Therapeutic activities;DME and/or AE instruction;Balance training;Patient/family education    OT Goals(Current goals can be found in the care plan section) Acute Rehab OT Goals Patient Stated Goal: Regain my independence OT Goal Formulation: With patient Time For Goal Achievement: 07/17/22 Potential to Achieve Goals: Good ADL Goals Pt Will Perform Grooming: standing;with min  guard assist Pt Will Perform Lower Body Dressing: with min guard assist;sit to/from stand Pt Will Transfer to Toilet: with min assist;ambulating;regular height toilet Pt Will Perform Toileting - Clothing Manipulation and hygiene: with modified independence;sitting/lateral leans Pt/caregiver will Perform Home Exercise Program: Increased strength;Both right and left upper extremity;With written HEP provided;With theraband  OT Frequency: Min 2X/week    Co-evaluation              AM-PAC OT "6 Clicks" Daily Activity     Outcome Measure Help from another person eating meals?: None Help from another person taking care of personal grooming?: A Little Help from another person toileting, which includes using toliet, bedpan, or urinal?: A Lot Help from another person bathing (including washing, rinsing, drying)?: A Lot Help from another person to put on and taking off regular upper body clothing?: A Little Help from another person to put on and taking off regular lower body clothing?: A Lot 6 Click Score: 16   End of Session Equipment Utilized During Treatment: Gait belt;Rolling walker (2 wheels)  Activity Tolerance: Patient tolerated treatment well Patient left: in bed;with call bell/phone within reach  OT Visit Diagnosis: Unsteadiness on feet (R26.81);Muscle weakness (generalized) (M62.81);Pain Pain - Right/Left: Right Pain - part of body: Leg                Time: 3976-7341 OT Time Calculation (min): 19 min Charges:  OT General Charges $OT  Visit: 1 Visit OT Evaluation $OT Eval Moderate Complexity: 1 Mod  07/03/2022  RP, OTR/L  Acute Rehabilitation Services  Office:  [phone]   Metta Clines 07/03/2022, 10:26 AM

## 2022-07-03 NOTE — Progress Notes (Signed)
PHARMACY CONSULT NOTE FOR:  OUTPATIENT  PARENTERAL ANTIBIOTIC THERAPY (OPAT)  Indication: MRSA bacteremia, r/o IE Regimen: Daptomycin 500 mg IV every 24 hours End date: 07/30/22  IV antibiotic discharge orders are pended. To discharging provider:  please sign these orders via discharge navigator,  Select New Orders & click on the button choice - Manage This Unsigned Work.     Thank you for allowing pharmacy to be a part of this patient's care.  Alycia Rossetti, PharmD, BCPS Infectious Diseases Clinical Pharmacist 07/03/2022 9:57 AM   **Pharmacist phone directory can now be found on [email or web address] ([email or web address]).  Listed under [facility].

## 2022-07-03 NOTE — Progress Notes (Signed)
Patient ID: Briana Sweeney, female   DOB: Jan 17, 1971, 51 y.o.   MRN: [id number] Patient is postoperative day 1 right transtibial amputation.  There is no drainage in the wound VAC canister.  Plan for physical therapy progressive ambulation.  Discharge planning based on physical therapy recommendations.

## 2022-07-03 NOTE — Progress Notes (Signed)
  Inpatient Rehabilitation Admissions Coordinator   Met with patient at bedside for rehab assessment. We discussed goals and expectations of a possible CIR admit. She prefers CIR for rehab.  I can admit to CIR once transitioned to primarily po pain meds with IV pain meds only for breakthrough pain. Please call me with any questions.   Danne Baxter, RN, MSN Rehab Admissions Coordinator [phone]

## 2022-07-03 NOTE — Progress Notes (Signed)
Physical Therapy Treatment Patient Details Name: Briana Sweeney MRN: [id number] DOB: 07/25/1971 Today's Date: 07/03/2022   History of Present Illness 51 y.o. female admitted 10/21 with Cellulitis and osteomyelitis of right foot, Acute renal failure superimposed on stage 3a chronic kidney disease, Severe protein-calorie malnutrition, Hepatic cirrhosis. 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    PT Comments    Patient complaining of R residual limb pain especially in dependent position. Able to stand from EOB with minA but unable to pivot on L foot at side of bed to simulate stand pivot transfer. She was able to laterally scoot on EOB with minA. Continue to recommend acute inpatient rehab (AIR) for post-acute therapy needs.     Recommendations for follow up therapy are one component of a multi-disciplinary discharge planning process, led by the attending physician.  Recommendations may be updated based on patient status, additional functional criteria and insurance authorization.  Follow Up Recommendations  Acute inpatient rehab (3hours/day)     Assistance Recommended at Discharge Frequent or constant Supervision/Assistance  Patient can return home with the following A lot of help with walking and/or transfers;A lot of help with bathing/dressing/bathroom;Assistance with cooking/housework;Assist for transportation   Equipment Recommendations  Rolling Leilany Digeronimo (2 wheels);BSC/3in1;Wheelchair (measurements PT);Wheelchair cushion (measurements PT)    Recommendations for Other Services       Precautions / Restrictions Precautions Precautions: Fall Restrictions Weight Bearing Restrictions: Yes RLE Weight Bearing: Non weight bearing     Mobility  Bed Mobility Overal bed mobility: Needs Assistance Bed Mobility: Rolling, Sidelying to Sit, Sit to Sidelying Rolling: Supervision Sidelying to sit: Supervision     Sit to sidelying: Supervision      Transfers Overall transfer level: Needs  assistance Equipment used: Rolling Oberia Beaudoin (2 wheels) Transfers: Sit to/from Stand, Bed to chair/wheelchair/BSC Sit to Stand: Min assist          Lateral/Scoot Transfers: Min assist General transfer comment: assist to boost into standing and cues for hand placement. Unable to pivot on L foot this session. Able to laterally scoot towards HOB with minA    Ambulation/Gait                   Stairs             Wheelchair Mobility    Modified Rankin (Stroke Patients Only)       Balance Overall balance assessment: Needs assistance Sitting-balance support: No upper extremity supported, Feet supported Sitting balance-Leahy Scale: Good     Standing balance support: Bilateral upper extremity supported, Reliant on assistive device for balance Standing balance-Leahy Scale: Poor                              Cognition Arousal/Alertness: Awake/alert Behavior During Therapy: WFL for tasks assessed/performed Overall Cognitive Status: No family/caregiver present to determine baseline cognitive functioning                                 General Comments: seems to demo slow processing and difficulty sequencing but no family to determine baseline cognition        Exercises      General Comments        Pertinent Vitals/Pain Pain Assessment Pain Assessment: Faces Faces Pain Scale: Hurts even more Pain Location: Rt residual limb Pain Descriptors / Indicators: Burning, Aching, Constant Pain Intervention(s): Monitored during session    Home Living  Prior Function            PT Goals (current goals can now be found in the care plan section) Acute Rehab PT Goals PT Goal Formulation: With patient Time For Goal Achievement: 07/16/22 Potential to Achieve Goals: Good Progress towards PT goals: Progressing toward goals    Frequency    Min 3X/week      PT Plan Frequency needs to be updated     Co-evaluation              AM-PAC PT "6 Clicks" Mobility   Outcome Measure  Help needed turning from your back to your side while in a flat bed without using bedrails?: A Little Help needed moving from lying on your back to sitting on the side of a flat bed without using bedrails?: A Little Help needed moving to and from a bed to a chair (including a wheelchair)?: A Little Help needed standing up from a chair using your arms (e.g., wheelchair or bedside chair)?: A Little Help needed to walk in hospital room?: Total Help needed climbing 3-5 steps with a railing? : Total 6 Click Score: 14    End of Session Equipment Utilized During Treatment: Gait belt Activity Tolerance: Patient tolerated treatment well Patient left: in bed;with call bell/phone within reach;with bed alarm set Nurse Communication: Mobility status PT Visit Diagnosis: History of falling (Z91.81);Difficulty in walking, not elsewhere classified (R26.2);Pain;Unsteadiness on feet (R26.81);Other abnormalities of gait and mobility (R26.89) Pain - Right/Left: Right Pain - part of body: Leg     Time: 6606-3016 PT Time Calculation (min) (ACUTE ONLY): 13 min  Charges:  $Therapeutic Activity: 8-22 mins                     Shubh Chiara A. Gilford Rile PT, DPT Acute Rehabilitation Services Office [phone]    Linna Hoff 07/03/2022, 4:45 PM

## 2022-07-03 NOTE — PMR Pre-admission (Signed)
PMR Admission Coordinator Pre-Admission Assessment  Patient: Briana Sweeney is an 51 y.o., female MRN: [id number] DOB: Nov 08, 1970 Height: $RemoveBefo'4\' 11"'znGclnOmSAG$  (149.9 cm) Weight: 67 kg  Insurance Information HMO:     PPO:      PCP:      IPA:      80/20:      OTHER:  PRIMARY: Medicaid [place] access      Policy#: [id number]      Subscriber: pt Benefits:  Phone #: passport one source online     Name: 10/26 Eff. Date: active 10/26  MADCY   Deduct:       Out of Pocket Max:       Life Max:  CIR: per medicaid      SNF: per medicaid Outpatient: per medicaid     Co-Pay:  Home Health: per medicaid      Co-Pay:  DME: per medicaid     Co-Pay:  Providers: in network  SECONDARY: none        Financial Counselor:       Phone#:   The Engineer, [employer]" for patients in Inpatient Rehabilitation Facilities with attached "Privacy Act [place] Records" was provided and verbally reviewed with: N/A  Emergency Contact Information Contact Information     Name Relation Home Work Mobile   hill,louise Friend   [phone]   Rale,[name] Sister   [phone]      Current Medical History  Patient Admitting Diagnosis: R BKA  History of Present Illness: 51 year old with history of hepatic cirrhosis,hepatitis C, esophageal varices, polysubstance abuse, and seizure disorder.  Presented on 06/29/22 with progressive skin and soft tissue infection of the right foot.   MRI of the foot shows extensive osteomyelitis throughout the bones of the midfoot, large volume tenosynovitis, extensive myositis and cellulitis.  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 Post op with wound VAC. ID consulted.  Per id recommendations to continue Daptomycin while in hospital and on CIR. ID team to be notified at time of discharge. Would then plan a dose of oritavancin at discharge followed by oral linezolid 7 days later until 11/21. Likely plan prolonged course of linezolid for MRSA bacteremia. This was  confirmed by ID Dr Juleen China.  Postoperative pain control and follow up with her anemia. Received monthly paracentesis and likely to require small volume paracentesis while hospitalized. Continue home nadolol, lasix and spironolactone. Keppra for seizure disorder. Monitor renal function. Home anxiety meds with Klonopin.  Patient's medical record from [facility] has been reviewed by the rehabilitation admission coordinator and physician.  Past Medical History  Past Medical History:  Diagnosis Date   Abscess of bursa of left elbow 09/23/2018   Acute hyperactive alcohol withdrawal delirium ([place]) 10/09/2017   Acute low back pain with bilateral sciatica    Acute metabolic encephalopathy 68/11/2120   Acute on chronic pancreatitis ([place]) 04/04/2021   AKI (acute kidney injury) ([place]) 04/04/2021   Alcohol withdrawal syndrome with complication ([place]) 48/25/0037   Alcohol-induced acute pancreatitis    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[place]) 12/05/2018   Ascites due to alcoholic cirrhosis ([place])    Bipolar affective disorder ([place])    With anxiety features   Bunionette of right foot 11/2019   Cirrhosis of liver ([place])    Due to alcohol and hepatitis C   Cocaine dependence without complication ([place]) 04/88/8916   Decompensated hepatic cirrhosis ([place]) 07/23/2017   Depression with anxiety    Epistaxis 01/20/2019   Erysipelas 09/13/2018   Esophageal  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 (HCC)    Esophageal varices without bleeding (HCC)    ETOHism (HCC)    GERD (gastroesophageal reflux disease)    Head injury    Hematemesis 02/10/2018   Hepatitis C 2018   hepatitis c and alcohol related hepatitis   Heroin abuse ([place])    History of blood transfusion    "blood doesn't clot; I fell down and had to have a transfusion"   History of kidney stones    Menopause 2016   Migraine    "when I get really stressed" (09/01/2017)   Neuropathy 10/27/2018   Olecranon bursitis of left elbow    Other mixed anxiety disorders  10/27/2018   Overdose 08/22/2018   Pancytopenia ([place]) 07/23/2017   Polysubstance abuse ([place]) 04/08/2018   Portal hypertension ([place]) 03/18/2018   S/P foot surgery, right 11/2019   Schizophrenia ([place])    Seizures ([place])    "when I run out of my RX; lots recently" (09/01/2017)   Suicidal ideation    Thrombocytopenia ([place])    Trichimoniasis 10/30/2017   Upper GI bleed 02/10/2018   Urinary urgency 05/2020   UTI (urinary tract infection) 06/02/2017   Has the patient had major surgery during 100 days prior to admission? Yes  Family History   family history includes Alcohol abuse in her mother; Lung cancer (age of onset: 35) in her mother; Throat cancer (age of onset: 72) in her father.  Current Medications  Current Facility-Administered Medications:    acetaminophen (TYLENOL) tablet 500 mg, 500 mg, Oral, Q6H PRN, Newt Minion, MD, 500 mg at 07/01/22 2217   alum & mag hydroxide-simeth (MAALOX/MYLANTA) 200-200-20 MG/5ML suspension 15-30 mL, 15-30 mL, Oral, Q2H PRN, Newt Minion, MD, 30 mL at 07/04/22 0349   ARIPiprazole (ABILIFY) tablet 10 mg, 10 mg, Oral, Daily, Linward Natal, MD, 10 mg at 07/05/22 1022   ascorbic acid (VITAMIN C) tablet 1,000 mg, 1,000 mg, Oral, Daily, Newt Minion, MD, 1,000 mg at 07/05/22 1021   bisacodyl (DULCOLAX) EC tablet 5 mg, 5 mg, Oral, Daily PRN, Newt Minion, MD   clonazePAM Bobbye [place]) tablet 0.5 mg, 0.5 mg, Oral, BID PRN, Newt Minion, MD, 0.5 mg at 07/05/22 0113   DAPTOmycin (CUBICIN) 500 mg in sodium chloride 0.9 % IVPB, 500 mg, Intravenous, Q2000, Mignon Pine, DO, Last Rate: 120 mL/hr at 07/04/22 2104, 500 mg at 07/04/22 2104   docusate sodium (COLACE) capsule 100 mg, 100 mg, Oral, Daily, Newt Minion, MD, 100 mg at 07/03/22 0915   enoxaparin (LOVENOX) injection 40 mg, 40 mg, Subcutaneous, Q24H, Newt Minion, MD, 40 mg at 07/04/22 2105   feeding supplement (ENSURE ENLIVE / ENSURE PLUS) liquid 237 mL, 237 mL, Oral, BID BM, Newt Minion,  MD, 237 mL at 07/05/22 1022   fiber supplement (BANATROL TF) liquid 60 mL, 60 mL, Oral, BID, Newt Minion, MD, 60 mL at 07/05/22 1019   FLUoxetine (PROZAC) capsule 40 mg, 40 mg, Oral, Daily, Newt Minion, MD, 40 mg at 16/10/96 0454   folic acid (FOLVITE) tablet 1 mg, 1 mg, Oral, Daily, Newt Minion, MD, 1 mg at 07/05/22 1020   furosemide (LASIX) tablet 40 mg, 40 mg, Oral, Daily, Linward Natal, MD, 40 mg at 07/05/22 1021   guaiFENesin-dextromethorphan (ROBITUSSIN DM) 100-10 MG/5ML syrup 15 mL, 15 mL, Oral, Q4H PRN, Newt Minion, MD   hydrALAZINE (APRESOLINE) injection 5 mg, 5 mg, Intravenous, Q20 Min PRN, Newt Minion, MD   HYDROmorphone (  DILAUDID) injection 0.5-1 mg, 0.5-1 mg, Intravenous, Q4H PRN, Linward Natal, MD   HYDROmorphone (DILAUDID) tablet 4 mg, 4 mg, Oral, Q4H PRN, Newt Minion, MD, 4 mg at 07/05/22 1020   lactulose (CHRONULAC) 10 GM/15ML solution 20 g, 20 g, Oral, BID, Farrel Gordon, DO   levETIRAcetam (KEPPRA) tablet 750 mg, 750 mg, Oral, BID, Newt Minion, MD, 750 mg at 07/05/22 1021   magnesium citrate solution 1 Bottle, 1 Bottle, Oral, Once PRN, Newt Minion, MD   magnesium sulfate IVPB 2 g 50 mL, 2 g, Intravenous, Daily PRN, Newt Minion, MD   multivitamin with minerals tablet 1 tablet, 1 tablet, Oral, Daily, Newt Minion, MD, 1 tablet at 07/05/22 1022   nadolol (CORGARD) tablet 20 mg, 20 mg, Oral, q morning, Linward Natal, MD, 20 mg at 07/05/22 1020   nutrition supplement (JUVEN) (JUVEN) powder packet 1 packet, 1 packet, Oral, BID BM, Newt Minion, MD, 1 packet at 07/05/22 1019   ondansetron (ZOFRAN) injection 4 mg, 4 mg, Intravenous, Q4H PRN, Newt Minion, MD, 4 mg at 06/30/22 1719   Oral care mouth rinse, 15 mL, Mouth Rinse, PRN, Newt Minion, MD   pantoprazole (PROTONIX) EC tablet 40 mg, 40 mg, Oral, Daily, Newt Minion, MD, 40 mg at 07/05/22 1020   phenol (CHLORASEPTIC) mouth spray 1 spray, 1 spray, Mouth/Throat, PRN, Newt Minion, MD    polyethylene glycol (MIRALAX / GLYCOLAX) packet 17 g, 17 g, Oral, Daily PRN, Newt Minion, MD   spironolactone (ALDACTONE) tablet 100 mg, 100 mg, Oral, Daily, Linward Natal, MD, 100 mg at 07/05/22 1021   thiamine (VITAMIN B1) tablet 100 mg, 100 mg, Oral, Daily, Newt Minion, MD, 100 mg at 07/05/22 1021   zinc sulfate capsule 220 mg, 220 mg, Oral, Daily, Newt Minion, MD, 220 mg at 07/05/22 1021  Facility-Administered Medications Ordered in Other Encounters:    acetaminophen (TYLENOL) 325 MG tablet, , , ,    diphenhydrAMINE (BENADRYL) 25 mg capsule, , , ,   Patients Current Diet:  Diet Order             Diet Carb Modified Fluid consistency: Thin; Room service appropriate? Yes  Diet effective now                  Precautions / Restrictions Precautions Precautions: Fall Precaution Comments: Reviewed verbally Restrictions Weight Bearing Restrictions: Yes RLE Weight Bearing: Non weight bearing   Has the patient had 2 or more falls or a fall with injury in the past year? Yes  Prior Activity Level Limited Community (1-2x/wk): Independent  Prior Functional Level Self Care: Did the patient need help bathing, dressing, using the toilet or eating? Independent  Indoor Mobility: Did the patient need assistance with walking from room to room (with or without device)? Independent  Stairs: Did the patient need assistance with internal or external stairs (with or without device)? Independent  Functional Cognition: Did the patient need help planning regular tasks such as shopping or remembering to take medications? Independent  Patient Information Are you of Hispanic, Latino/a,or Spanish origin?: A. No, not of Hispanic, Latino/a, or Spanish origin What is your race?: A. White Do you need or want an interpreter to communicate with a doctor or health care staff?: 0. No  Patient's Response To:  Health Literacy and Transportation Is the patient able to respond to health literacy  and transportation needs?: Yes Health Literacy - How often do you need to  have someone help you when you read instructions, pamphlets, or other written material from your doctor or pharmacy?: Never In the past 12 months, has lack of transportation kept you from medical appointments or from getting medications?: No In the past 12 months, has lack of transportation kept you from meetings, work, or from getting things needed for daily living?: No  Development worker, international aid / [place] Devices/Equipment: None Home Equipment: Grab bars - tub/shower, Grab bars - toilet, Wheelchair - manual  Prior Device Use: Indicate devices/aids used by the patient prior to current illness, exacerbation or injury? None of the above  Current Functional Level Cognition  Overall Cognitive Status: No family/caregiver present to determine baseline cognitive functioning Orientation Level: Oriented X4 General Comments: seems to demo slow processing and difficulty sequencing but no family to determine baseline cognition    Extremity Assessment (includes Sensation/Coordination)  Upper Extremity Assessment: Overall WFL for tasks assessed  Lower Extremity Assessment: Defer to PT evaluation RLE Deficits / Details: post op BKA  - weakness, guarding as expected RLE: Unable to fully assess due to pain    ADLs  Overall ADL's : Needs assistance/impaired Eating/Feeding: Independent, Bed level Grooming: Wash/dry hands, Wash/dry face, Min guard, Sitting Upper Body Bathing: Min guard, Sitting Lower Body Bathing: +2 for safety/equipment, Bed level Upper Body Dressing : Min guard, Sitting Lower Body Dressing: Minimal assistance, Bed level Toilet Transfer: Moderate assistance, Stand-pivot, BSC/3in1    Mobility  Overal bed mobility: Needs Assistance Bed Mobility: Rolling, Sidelying to Sit, Sit to Sidelying Rolling: Supervision Sidelying to sit: Supervision Sit to sidelying: Supervision General bed mobility  comments: Educated on bed mobility techniques including rolling with min assist, use of hand rail as needed, Mod assist for trunk and RLE support to EOB. Pt able to scoot to EOB in seated position with min assist, and slide self up in bed with min assist using bridge with LLE to push and UEs to pull HOB rail.    Transfers  Overall transfer level: Needs assistance Equipment used: Rolling walker (2 wheels) Transfers: Sit to/from Stand, Bed to chair/wheelchair/BSC Sit to Stand: Min assist Bed to/from chair/wheelchair/BSC transfer type:: Lateral/scoot transfer  Lateral/Scoot Transfers: Min assist General transfer comment: assist to boost into standing and cues for hand placement. Unable to pivot on L foot this session. Able to laterally scoot towards [facility] with minA    Ambulation / Gait / Stairs / Wheelchair Mobility       Posture / Balance Dynamic Sitting Balance Sitting balance - Comments: Able to reach forward and lateral, across chest with fair range, pulling self back to midline without assistance. Balance Overall balance assessment: Needs assistance Sitting-balance support: No upper extremity supported, Feet supported Sitting balance-Leahy Scale: Good Sitting balance - Comments: Able to reach forward and lateral, across chest with fair range, pulling self back to midline without assistance. Standing balance support: Bilateral upper extremity supported, Reliant on assistive device for balance Standing balance-Leahy Scale: Poor    Special needs/care consideration MRSA isolation Postop WOUND VAC   Previous Home Environment  Living Arrangements: Alone  Lives With: Alone Available Help at Discharge: Neighbor, Available PRN/intermittently Type of Home:  (handicapped apartment housing) Home Layout: One level Home Access: Elevator Bathroom Shower/Tub: Chiropodist: Standard Bathroom Accessibility: Yes How Accessible: Accessible via walker [place]:  No  Discharge Living Setting Plans for Discharge Living Setting: Patient's home, Alone, Apartment (handicapped apartment) Type of Home at Discharge: Apartment Discharge Home Layout: One level Discharge Home  Access: Elevator Discharge Bathroom Shower/Tub: Tub/shower unit Discharge Bathroom Toilet: Standard Discharge Bathroom Accessibility: Yes How Accessible: Accessible via walker Does the patient have any problems obtaining your medications?: No  Social/Family/Support Systems Anticipated Caregiver: neighbors and Environmental education officer and his wife Anticipated Ambulance person Information: see contacts Ability/Limitations of Caregiver: intermittent only Caregiver Availability: Intermittent Discharge Plan Discussed with Primary Caregiver: Yes Is Caregiver In Agreement with Plan?: Yes Does Caregiver/Family have Issues with Lodging/Transportation while Pt is in Rehab?: No Goals Patient/Family Goal for Rehab: Mod I with PT and OT at wheelchair level Expected length of stay: ELOS 7 to 10 days Pt/Family Agrees to Admission and willing to participate: Yes Program Orientation Provided & Reviewed with Pt/Caregiver Including Roles  & Responsibilities: Yes  Decrease burden of Care through IP rehab admission: n/a  Possible need for SNF placement upon discharge: not anticipated  Patient Condition: I have reviewed medical [facility], spoken with patient. I met with patient at the bedside for inpatient rehabilitation assessment.  Patient will benefit from ongoing PT and OT, can actively participate in 3 hours of therapy a day 5 days of the week, and can make measurable gains during the admission.  Patient will also benefit from the coordinated team approach during an Inpatient Acute Rehabilitation admission.  The patient will receive intensive therapy as well as Rehabilitation physician, nursing, social worker, and care management interventions.  Due to bladder management, bowel management,  safety, skin/wound care, disease management, medication administration, pain management, and patient education the patient requires 24 hour a day rehabilitation nursing.  The patient is currently min assist  with mobility and basic ADLs.  Discharge setting and therapy post discharge at home with home health is anticipated.  Patient has agreed to participate in the Acute Inpatient Rehabilitation Program and will admit today.  Preadmission Screen Completed By:  Retta Diones, 07/05/2022 10:33 AM ______________________________________________________________________   Discussed status with Dr. Tressa Busman on 07/05/22 at 0945 and received approval for admission today.  Admission Coordinator:  Retta Diones, RN, time 1035/Date 07/05/22   Assessment/Plan: Diagnosis: Does the need for close, 24 hr/day Medical supervision in concert with the patient's rehab needs make it unreasonable for this patient to be served in a less intensive setting? Yes Co-Morbidities requiring supervision/potential complications: Pain control, wound vac management s/p BKA, liver cirrhosis, bowel/bladder incontinence, renal failure, MRSA infection. Due to bladder management, bowel management, safety, skin/wound care, disease management, medication administration, pain management, and patient education, does the patient require 24 hr/day rehab nursing? Yes Does the patient require coordinated care of a physician, rehab nurse, PT, OT, and SLP to address physical and functional deficits in the context of the above medical diagnosis(es)? Yes Addressing deficits in the following areas: balance, endurance, locomotion, strength, transferring, bowel/bladder control, bathing, dressing, feeding, grooming, toileting, and cognition Can the patient actively participate in an intensive therapy program of at least 3 hrs of therapy 5 days a week? Yes The potential for patient to make measurable gains while on inpatient rehab is good Anticipated  functional outcomes upon discharge from inpatient rehab: modified independent PT, modified independent OT, independent SLP Estimated rehab length of stay to reach the above functional goals is: 10-14 days Anticipated discharge destination: Home 10. Overall Rehab/Functional Prognosis: good   MD Signature:  Gertie Gowda, DO 07/05/2022

## 2022-07-03 NOTE — Progress Notes (Signed)
Subjective:   Patient continues to endorse burning pain in her right lower extremity.  Endorses some tightness in her abdomen.  We discussed the emotional burden of losing her foot and that we provide resources and support.  States she is drinking more fluids today.  She only ate some of her breakfast.   Objective:  Vital signs in last 24 hours: Vitals:   07/02/22 2227 07/03/22 0419 07/03/22 0858 07/03/22 1117  BP: 122/88 129/64 (!) 130/91 (!) 121/96  Pulse: 98 92 95 83  Resp: 18 16    Temp: 98 F (36.7 C) 98 F (36.7 C) 98 F (36.7 C)   TempSrc: Oral Oral Oral   SpO2: 93% 97% 94%   Weight:  68.4 kg    Height:       Physical Exam  Constitutional: well-nourished, and in no distress.   HEENT: Scleral icterus Pulmonary: Non labored breathing on 2L Abdomen: soft, distended, nontender Neurological: Alert and oriented to person, place, and time.  Skin: Skin is warm and dry. Yellowing of the skin diffusely.  Right BKA with stump shrinker and immobilizer. Neuro: Alert and oriented x3     Latest Ref Rng & Units 07/03/2022   12:10 AM 07/02/2022    4:57 AM 07/01/2022    3:27 AM  CBC  WBC 4.0 - 10.5 K/uL 9.8  12.6  14.5   Hemoglobin 12.0 - 15.0 g/dL 11.4  11.6  11.2   Hematocrit 36.0 - 46.0 % 34.3  36.8  35.4   Platelets 150 - 400 K/uL 145  164  183       Latest Ref Rng & Units 07/03/2022   12:10 AM 07/02/2022    4:57 AM 07/01/2022    3:27 AM  CMP  Glucose 70 - 99 mg/dL 188  88  119   BUN 6 - 20 mg/dL '13  12  21   '$ Creatinine 0.44 - 1.00 mg/dL 0.59  0.71  1.11   Sodium 135 - 145 mmol/L 134  131  133   Potassium 3.5 - 5.1 mmol/L 4.7  5.3  4.3   Chloride 98 - 111 mmol/L 100  98  99   CO2 22 - 32 mmol/L '26  23  22   '$ Calcium 8.9 - 10.3 mg/dL 8.6  8.5  8.2   Total Protein 6.5 - 8.1 g/dL 5.3  5.2  4.9   Total Bilirubin 0.3 - 1.2 mg/dL 6.5  8.4  7.1   Alkaline Phos 38 - 126 U/L 240  212  164   AST 15 - 41 U/L 37  39  29   ALT 0 - 44 U/L '22  20  22     '$ Assessment/Plan:  Principal Problem:   MRSA bacteremia Active Problems:   Acute renal failure superimposed on stage 3a chronic kidney disease (HCC)   Severe protein-calorie malnutrition (HCC)   Cellulitis of right foot   Hepatic cirrhosis (HCC)   Sepsis (HCC)   Foot osteomyelitis, right ([place])   Cutaneous abscess of right foot  [name] is a 51 year old female living with advanced alcoholic liver cirrhosis c/b recurrent ascites and esophageal varices s/p banding, polysubstance use, h/o hepatitis C s/p treatment (recent hep C RNA negative), and seizure disorder on keppra who presented with painful erythematous R foot, found to have R midfoot osteomyelitis, cellulitis, myositis and MRSA bacteremia now s/p right below-knee amputation..   #R foot osteomyelitis, cellulitis, myositis #MRSA bacteremia #Sepsis, resolved S/p right below-knee amputation with wound VAC  in place. Tachycardia improved. Continues to be afebrile.  Now without leukocytosis.  T bili and sCr improving.  Repeat blood cultures negative at 24 hours.  Patient is on daptomycin.  Will forego TEE per infectious disease.  Needs PICC line at 72 hours postoperative if blood cultures remain negative. -Infectious disease following -Antibiotics for 4 weeks, end date 07/30/2022 -Oxycodone and hydromorphone for pain control  -Daily CBC -Follow-up with orthopedics 1 week after discharge  #Acute renal failure  Likely prerenal though also potential for hepatorenal syndrome. sCr improving with IVF.Receiving NS 75 ml/hr for 24 hrs. -IVF as needed  #Decompensated cirrhosis 2/2 to alcohol use c/b esophageal varices  #Treated Hepatitis C (recent Hep C RNA negative) Patient notes that she receives monthly paracenteses. Skin is jaundiced, sclera icteric.  Baseline mentation.  T bili improved today. On admission, INR 1.4.  Her MELD Na is 26, 19.6% 3 month mortality, Child Pugh Class C. Abdomen distended today. Appears stable  clinically. Will continue to monitor for abdominal discomfort, patient may require small volume paracentesis while inpatient. -Restart home nadolol, Lasix, spironolactone -Continue lactulose 20 g 3 times daily -Continue to monitor patient's renal function  #History of seizures Patient reports that she has a history of seizures since 51 years old.  -Keppra 750 mg twice daily  #Anxiety  Continue patient's home medications.  No dose adjustments needed for her klonopin in the setting of her renal failure or hepatic dysfunction -klonopin 0.5 mg twice daily as needed   #History of thrombocytopenia Patient has received Nplate in the past.  Her platelet counts are normal this hospitalization. -Continue to monitor   Prior to Admission Living Arrangement: Living independently  Anticipated Discharge Location: Home  Barriers to Discharge: None Dispo: Anticipated discharge in approximately 4 day(s).   Linward Natal, MD 07/03/2022, 3:34 PM After 5pm on weekdays and 1pm on weekends: On Call pager [phone]

## 2022-07-03 NOTE — Progress Notes (Signed)
[facility]  Date of Admission:  06/29/2022     Total days of antibiotics 5         ASSESSMENT:  Briana Sweeney is POD#1 from right BKA amputation with post-surgical pain. Multiple sets of blood cultures remain without growth to date. TEE with preserved valve function and no vegetations. TEE may be prohibitive with current cirrhosis and varices. Source control achieved with surgical intervention. Will plan for 4 weeks of treatment with Daptomycin from date of surgery with end date 11/21. Being evaluated for CIR. Home Health/OPAT orders below. Post-surgical wound care per Orthopedics. Remaining medical and supportive care per primary team.    PLAN:  Continue current dose of Daptomycin.  Home Health/OPAT orders. Post-surgical care per Orthopedics. OPAT/Home Health orders.  Will arrange follow up in ID clinic.  Remaining medical and supportive care per primary team.   Diagnosis: Right foot osteomyelitis complicated by MRSA bacteremia   Culture Result: MRSA   Allergies  Allergen Reactions   Other Other (See Comments)    Platelets: Rx chest pain, tremors, body aches  Patient stated that she receives platelet injection every Wednesday     OPAT Orders Discharge antibiotics to be given via PICC line Discharge antibiotics: Per pharmacy protocol  Aim for Vancomycin trough 15-20 or AUC 400-550 (unless otherwise indicated) Duration: 4 weeks  End Date: 07/30/22  [facility] Care Per Protocol:  Home health RN for IV administration and teaching; PICC line care and labs.    Labs weekly while on IV antibiotics: _X_ CBC with differential _X_ BMP __ CMP _X_ CRP _X_ ESR __ Vancomycin trough __ CK  _X_ Please pull PIC at completion of IV antibiotics __ Please leave PIC in place until doctor has seen patient or been notified  Fax weekly labs to [phone]  Clinic Follow Up Appt:  07/29/22 at 2:30 pm with Dr. Earlene Plater    Principal Problem:   MRSA  bacteremia Active Problems:   Acute renal failure superimposed on stage 3a chronic kidney disease (HCC)   Severe protein-calorie malnutrition (HCC)   Cellulitis of right foot   Hepatic cirrhosis (HCC)   Sepsis (HCC)   Foot osteomyelitis, right (HCC)   Cutaneous abscess of right foot    vitamin C  1,000 mg Oral Daily   docusate sodium  100 mg Oral Daily   enoxaparin (LOVENOX) injection  40 mg Subcutaneous Q24H   feeding supplement  237 mL Oral BID BM   fiber supplement (BANATROL TF)  60 mL Oral BID   FLUoxetine  40 mg Oral Daily   folic acid  1 mg Oral Daily   furosemide  40 mg Oral Daily   lactulose  20 g Oral TID   levETIRAcetam  750 mg Oral BID   multivitamin with minerals  1 tablet Oral Daily   nadolol  20 mg Oral q morning   nutrition supplement (JUVEN)  1 packet Oral BID BM   pantoprazole  40 mg Oral Daily   spironolactone  100 mg Oral Daily   thiamine  100 mg Oral Daily   zinc sulfate  220 mg Oral Daily    SUBJECTIVE:  Afebrile overnight with no acute events. Having pain in her right lower extremity.   Allergies  Allergen Reactions   Other Other (See Comments)    Platelets: Rx chest pain, tremors, body aches  Patient stated that she receives platelet injection every Wednesday      Review of Systems: Review of Systems  Constitutional:  Negative for chills, fever and weight loss.  Respiratory:  Negative for cough, shortness of breath and wheezing.   Cardiovascular:  Negative for chest pain and leg swelling.  Gastrointestinal:  Negative for abdominal pain, constipation, diarrhea, nausea and vomiting.  Skin:  Negative for rash.      OBJECTIVE: Vitals:   07/02/22 1633 07/02/22 2227 07/03/22 0419 07/03/22 0858  BP: 113/81 122/88 129/64 (!) 130/91  Pulse: 99 98 92 95  Resp: $Remo'18 18 16   'QyoJl$ Temp: 98.2 F (36.8 C) 98 F (36.7 C) 98 F (36.7 C) 98 F (36.7 C)  TempSrc: Oral Oral Oral Oral  SpO2: 96% 93% 97% 94%  Weight:   68.4 kg   Height:       Body mass  index is 30.46 kg/m.  Physical Exam Constitutional:      General: She is not in acute distress.    Appearance: She is well-developed.  Cardiovascular:     Rate and Rhythm: Normal rate and regular rhythm.     Heart sounds: Normal heart sounds.  Pulmonary:     Effort: Pulmonary effort is normal.     Breath sounds: Normal breath sounds.  Musculoskeletal:     Comments: Surgical dressing in place; clean and dry .  Skin:    General: Skin is warm and dry.  Neurological:     Mental Status: She is alert and oriented to person, place, and time.  Psychiatric:        Mood and Affect: Mood normal.     Lab Results Lab Results  Component Value Date   WBC 9.8 07/03/2022   HGB 11.4 (L) 07/03/2022   HCT 34.3 (L) 07/03/2022   MCV 87.9 07/03/2022   PLT 145 (L) 07/03/2022    Lab Results  Component Value Date   CREATININE 0.59 07/03/2022   BUN 13 07/03/2022   NA 134 (L) 07/03/2022   K 4.7 07/03/2022   CL 100 07/03/2022   CO2 26 07/03/2022    Lab Results  Component Value Date   ALT 22 07/03/2022   AST 37 07/03/2022   GGT 387 (H) 12/10/2018   ALKPHOS 240 (H) 07/03/2022   BILITOT 6.5 (H) 07/03/2022     Microbiology: Recent Results (from the past 240 hour(s))  Blood culture (routine x 2)     Status: Abnormal   Collection Time: 06/29/22  3:46 PM   Specimen: BLOOD RIGHT FOREARM  Result Value Ref Range Status   Specimen Description BLOOD RIGHT FOREARM  Final   Special Requests   Final    BOTTLES DRAWN AEROBIC AND ANAEROBIC Blood Culture adequate volume   Culture  Setup Time   Final    GRAM POSITIVE COCCI IN CLUSTERS AEROBIC BOTTLE ONLY CRITICAL RESULT CALLED TO, READ BACK BY AND VERIFIED WITH: PHARMD T DANG [id number] AT 1437 BY CM Performed at [facility], [place] [address], [place], [place] [address]    Culture METHICILLIN RESISTANT STAPHYLOCOCCUS AUREUS (A)  Final   Report Status 07/02/2022 FINAL  Final   Organism ID, Bacteria METHICILLIN RESISTANT STAPHYLOCOCCUS AUREUS   Final      Susceptibility   Methicillin resistant staphylococcus aureus - MIC*    CIPROFLOXACIN >=8 RESISTANT Resistant     ERYTHROMYCIN >=8 RESISTANT Resistant     GENTAMICIN <=0.5 SENSITIVE Sensitive     OXACILLIN >=4 RESISTANT Resistant     TETRACYCLINE 2 SENSITIVE Sensitive     VANCOMYCIN 1 SENSITIVE Sensitive     TRIMETH/SULFA >=320 RESISTANT  Resistant     CLINDAMYCIN <=0.25 SENSITIVE Sensitive     RIFAMPIN >=32 RESISTANT Resistant     Inducible Clindamycin NEGATIVE Sensitive     * METHICILLIN RESISTANT STAPHYLOCOCCUS AUREUS  Blood Culture ID Panel (Reflexed)     Status: Abnormal   Collection Time: 06/29/22  3:46 PM  Result Value Ref Range Status   Enterococcus faecalis NOT DETECTED NOT DETECTED Final   Enterococcus Faecium NOT DETECTED NOT DETECTED Final   Listeria monocytogenes NOT DETECTED NOT DETECTED Final   Staphylococcus species DETECTED (A) NOT DETECTED Final    Comment: CRITICAL RESULT CALLED TO, READ BACK BY AND VERIFIED WITH: PHARMD T DANG [id number] AT 1437 BY CM    Staphylococcus aureus (BCID) DETECTED (A) NOT DETECTED Final    Comment: Methicillin (oxacillin)-resistant Staphylococcus aureus (MRSA). MRSA is predictably resistant to beta-lactam antibiotics (except ceftaroline). Preferred therapy is vancomycin unless clinically contraindicated. Patient requires contact precautions if  hospitalized. CRITICAL RESULT CALLED TO, READ BACK BY AND VERIFIED WITH: PHARMD T DANG [id number] AT 1437 BY CM    Staphylococcus epidermidis NOT DETECTED NOT DETECTED Final   Staphylococcus lugdunensis NOT DETECTED NOT DETECTED Final   Streptococcus species NOT DETECTED NOT DETECTED Final   Streptococcus agalactiae NOT DETECTED NOT DETECTED Final   Streptococcus pneumoniae NOT DETECTED NOT DETECTED Final   Streptococcus pyogenes NOT DETECTED NOT DETECTED Final   A.calcoaceticus-baumannii NOT DETECTED NOT DETECTED Final   Bacteroides fragilis NOT DETECTED NOT DETECTED Final    Enterobacterales NOT DETECTED NOT DETECTED Final   Enterobacter cloacae complex NOT DETECTED NOT DETECTED Final   Escherichia coli NOT DETECTED NOT DETECTED Final   Klebsiella aerogenes NOT DETECTED NOT DETECTED Final   Klebsiella oxytoca NOT DETECTED NOT DETECTED Final   Klebsiella pneumoniae NOT DETECTED NOT DETECTED Final   Proteus species NOT DETECTED NOT DETECTED Final   Salmonella species NOT DETECTED NOT DETECTED Final   Serratia marcescens NOT DETECTED NOT DETECTED Final   Haemophilus influenzae NOT DETECTED NOT DETECTED Final   Neisseria meningitidis NOT DETECTED NOT DETECTED Final   Pseudomonas aeruginosa NOT DETECTED NOT DETECTED Final   Stenotrophomonas maltophilia NOT DETECTED NOT DETECTED Final   Candida albicans NOT DETECTED NOT DETECTED Final   Candida auris NOT DETECTED NOT DETECTED Final   Candida glabrata NOT DETECTED NOT DETECTED Final   Candida krusei NOT DETECTED NOT DETECTED Final   Candida parapsilosis NOT DETECTED NOT DETECTED Final   Candida tropicalis NOT DETECTED NOT DETECTED Final   Cryptococcus neoformans/gattii NOT DETECTED NOT DETECTED Final   Meth resistant mecA/C and MREJ DETECTED (A) NOT DETECTED Final    Comment: CRITICAL RESULT CALLED TO, READ BACK BY AND VERIFIED WITH: PHARMD T DANG [id number] AT 1437 BY CM Performed at [facility], [address], [place], [place] [address]   Blood culture (routine x 2)     Status: Abnormal   Collection Time: 06/29/22  4:00 PM   Specimen: BLOOD  Result Value Ref Range Status   Specimen Description BLOOD RIGHT ANTECUBITAL  Final   Special Requests   Final    BOTTLES DRAWN AEROBIC AND ANAEROBIC Blood Culture adequate volume   Culture  Setup Time   Final    GRAM POSITIVE COCCI IN CLUSTERS BOTTLES DRAWN AEROBIC ONLY CRITICAL RESULT CALLED TO, READ BACK BY AND VERIFIED WITH: PHARMD L. BELL [id number] $RemoveBefor'@1925'GcZctwnOiKEc$  FH CRITICAL VALUE NOTED.  VALUE IS CONSISTENT WITH PREVIOUSLY REPORTED AND CALLED VALUE.    Culture (A)   Final    STAPHYLOCOCCUS  AUREUS SUSCEPTIBILITIES PERFORMED ON PREVIOUS CULTURE WITHIN THE LAST 5 DAYS. Performed at [facility], [place] [address], [place], [place] [address]    Report Status 07/02/2022 FINAL  Final  Culture, blood (Routine X 2) w Reflex to ID Panel     Status: None (Preliminary result)   Collection Time: 06/30/22  7:58 PM   Specimen: BLOOD RIGHT HAND  Result Value Ref Range Status   Specimen Description BLOOD RIGHT HAND  Final   Special Requests   Final    BOTTLES DRAWN AEROBIC ONLY Blood Culture results may not be optimal due to an inadequate volume of blood received in culture bottles   Culture   Final    NO GROWTH 3 DAYS Performed at [facility], [place] [address], [place], [place] [address]    Report Status PENDING  Incomplete  Culture, blood (Routine X 2) w Reflex to ID Panel     Status: None (Preliminary result)   Collection Time: 06/30/22  7:58 PM   Specimen: BLOOD  Result Value Ref Range Status   Specimen Description BLOOD SITE NOT SPECIFIED  Final   Special Requests   Final    BOTTLES DRAWN AEROBIC ONLY Blood Culture results may not be optimal due to an inadequate volume of blood received in culture bottles   Culture   Final    NO GROWTH 3 DAYS Performed at [facility], [place] [address], [place], [place] [address]    Report Status PENDING  Incomplete  Culture, blood (Routine X 2) w Reflex to ID Panel     Status: None (Preliminary result)   Collection Time: 07/02/22  2:52 PM   Specimen: BLOOD  Result Value Ref Range Status   Specimen Description BLOOD LEFT ANTECUBITAL  Final   Special Requests   Final    BOTTLES DRAWN AEROBIC AND ANAEROBIC Blood Culture adequate volume   Culture   Final    NO GROWTH < 24 HOURS Performed at [facility], [place] [address], [place], [place] [address]    Report Status PENDING  Incomplete  Culture, blood (Routine X 2) w Reflex to ID Panel     Status: None (Preliminary result)   Collection Time: 07/02/22   2:55 PM   Specimen: BLOOD  Result Value Ref Range Status   Specimen Description BLOOD BLOOD RIGHT HAND  Final   Special Requests   Final    BOTTLES DRAWN AEROBIC AND ANAEROBIC Blood Culture adequate volume   Culture   Final    NO GROWTH < 24 HOURS Performed at [facility], [place] [address], [place], [place] [address]    Report Status PENDING  Incomplete     Terri Piedra, NP [facility]  07/03/2022  9:54 AM

## 2022-07-04 ENCOUNTER — Ambulatory Visit: Payer: Medicaid Other

## 2022-07-04 DIAGNOSIS — B9562 Methicillin resistant Staphylococcus aureus infection as the cause of diseases classified elsewhere: Secondary | ICD-10-CM | POA: Diagnosis not present

## 2022-07-04 DIAGNOSIS — L03115 Cellulitis of right lower limb: Secondary | ICD-10-CM | POA: Diagnosis not present

## 2022-07-04 DIAGNOSIS — M60073 Infective myositis, right foot: Secondary | ICD-10-CM | POA: Diagnosis not present

## 2022-07-04 DIAGNOSIS — R7881 Bacteremia: Secondary | ICD-10-CM | POA: Diagnosis not present

## 2022-07-04 LAB — COMPREHENSIVE METABOLIC PANEL
ALT: 32 U/L (ref 0–44)
AST: 55 U/L — ABNORMAL HIGH (ref 15–41)
Albumin: 1.8 g/dL — ABNORMAL LOW (ref 3.5–5.0)
Alkaline Phosphatase: 316 U/L — ABNORMAL HIGH (ref 38–126)
Anion gap: 8 (ref 5–15)
BUN: 16 mg/dL (ref 6–20)
CO2: 27 mmol/L (ref 22–32)
Calcium: 8.8 mg/dL — ABNORMAL LOW (ref 8.9–10.3)
Chloride: 99 mmol/L (ref 98–111)
Creatinine, Ser: 0.48 mg/dL (ref 0.44–1.00)
GFR, Estimated: >60 mL/min (ref >60)
Glucose, Bld: 109 mg/dL — ABNORMAL HIGH (ref 70–99)
Potassium: 4.7 mmol/L (ref 3.5–5.1)
Sodium: 134 mmol/L — ABNORMAL LOW (ref 135–145)
Total Bilirubin: 3.9 mg/dL — ABNORMAL HIGH (ref 0.3–1.2)
Total Protein: 5.4 g/dL — ABNORMAL LOW (ref 6.5–8.1)

## 2022-07-04 LAB — CBC
HCT: 35.7 % — ABNORMAL LOW (ref 36.0–46.0)
Hemoglobin: 11.4 g/dL — ABNORMAL LOW (ref 12.0–15.0)
MCH: 28 pg (ref 26.0–34.0)
MCHC: 31.9 g/dL (ref 30.0–36.0)
MCV: 87.7 fL (ref 80.0–100.0)
Platelets: 165 10*3/uL (ref 150–400)
RBC: 4.07 MIL/uL (ref 3.87–5.11)
WBC: 14.4 10*3/uL — ABNORMAL HIGH (ref 4.0–10.5)
nRBC: 0 % (ref 0.0–0.2)

## 2022-07-04 LAB — GLUCOSE, CAPILLARY
Glucose-Capillary: 84 mg/dL (ref 70–99)
Glucose-Capillary: 86 mg/dL (ref 70–99)
Glucose-Capillary: 69 mg/dL — ABNORMAL LOW (ref 70–99)
Glucose-Capillary: 73 mg/dL (ref 70–99)
Glucose-Capillary: 110 mg/dL — ABNORMAL HIGH (ref 70–99)

## 2022-07-04 MED ORDER — HYDROMORPHONE HCL 1 MG/ML IJ SOLN: 0.5000 mg | INTRAMUSCULAR | Status: DC | PRN

## 2022-07-04 MED ORDER — ARIPIPRAZOLE 5 MG PO TABS
10.0000 mg | ORAL_TABLET | Freq: Every day | ORAL | Status: DC
  Administered 2022-07-05: 10 mg via ORAL
  Filled 2022-07-04: qty 2

## 2022-07-04 NOTE — TOC Progression Note (Signed)
Transition of Care [facility]) - Progression Note    Patient Details  Name: Briana Sweeney MRN: [id number] Date of Birth: Sep 14, 1970  [facility]) CM/SW Contact  Tom-Johnson, Renea Ee, RN Phone Number: 07/04/2022, 8:48 PM  Clinical Narrative:     Patient had rt below the knee amputation with wound vac placement. ID recommends Abx till 07/30/22. CIR following for possible admit. CM will continue to follow as patient progresses with care towards discharge.       Expected Discharge Plan and Services                                                 Social Determinants of Health (SDOH) Interventions    Readmission Risk Interventions    03/01/2022   12:43 PM 02/02/2022   12:29 PM  Readmission Risk Prevention Plan  Transportation Screening Complete Complete  Medication Review ([place]) Complete Complete  PCP or Specialist appointment within 3-5 days of discharge Complete Complete  HRI or Home Care Consult Complete Complete  SW Recovery Care/Counseling Consult Complete Complete  Palliative Care Screening Not Applicable Complete  [place] Not Applicable Not Applicable

## 2022-07-04 NOTE — Progress Notes (Signed)
Subjective:   Patient continues to endorse right distal extremity burning pain.  She feels like her abdominal distention is a little bit better compared to yesterday.  We discussed the importance of her nutrition.  We also discussed her history of polysubstance abuse.  Patient denies any history of polysubstance use or receiving platelets, but does endorse alcohol use.  She states she does not take any pain medication at home.  She states that her immobilizer and dressings have not been removed since surgery.  She has been out of bed and can stand with minimal assistance.  Objective:  Vital signs in last 24 hours: Vitals:   07/03/22 1117 07/03/22 2049 07/04/22 0514 07/04/22 0900  BP: (!) 121/96 112/73 106/76 110/73  Pulse: 83 71 70 72  Resp:  '18 18 16  '$ Temp:  98 F (36.7 C) (!) 97.3 F (36.3 C) 98 F (36.7 C)  TempSrc:  Oral Oral Oral  SpO2:  96% 98% 99%  Weight:      Height:       Physical Exam  Constitutional: well-nourished, and in no distress.   HEENT: Scleral icterus Pulmonary: Non labored breathing on RA Abdomen: soft, distended, nontender Neurological: Alert and oriented to person, place, and time.  Skin: Skin is warm and dry. Yellowing of the skin diffusely.  Right BKA with stump shrinker and immobilizer. Neuro: Alert and oriented x3     Latest Ref Rng & Units 07/04/2022    4:49 AM 07/03/2022   12:10 AM 07/02/2022    4:57 AM  CBC  WBC 4.0 - 10.5 K/uL 14.4  9.8  12.6   Hemoglobin 12.0 - 15.0 g/dL 11.4  11.4  11.6   Hematocrit 36.0 - 46.0 % 35.7  34.3  36.8   Platelets 150 - 400 K/uL 165  145  164       Latest Ref Rng & Units 07/04/2022    4:49 AM 07/03/2022   12:10 AM 07/02/2022    4:57 AM  CMP  Glucose 70 - 99 mg/dL 109  188  88   BUN 6 - 20 mg/dL '16  13  12   '$ Creatinine 0.44 - 1.00 mg/dL 0.48  0.59  0.71   Sodium 135 - 145 mmol/L 134  134  131   Potassium 3.5 - 5.1 mmol/L 4.7  4.7  5.3   Chloride 98 - 111 mmol/L 99  100  98   CO2 22 - 32 mmol/L '27  26   23   '$ Calcium 8.9 - 10.3 mg/dL 8.8  8.6  8.5   Total Protein 6.5 - 8.1 g/dL 5.4  5.3  5.2   Total Bilirubin 0.3 - 1.2 mg/dL 3.9  6.5  8.4   Alkaline Phos 38 - 126 U/L 316  240  212   AST 15 - 41 U/L 55  37  39   ALT 0 - 44 U/L 32  22  20    Assessment/Plan:  Principal Problem:   MRSA bacteremia Active Problems:   Acute renal failure superimposed on stage 3a chronic kidney disease (HCC)   Severe protein-calorie malnutrition (HCC)   Cellulitis of right foot   Hepatic cirrhosis (HCC)   Sepsis (HCC)   Foot osteomyelitis, right ([place])   Cutaneous abscess of right foot  Briana Sweeney is a 51 year old female living with 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 Continues to be afebrile.  Now without leukocytosis.  T bili and sCr continue to improve.  Repeat blood cultures negative at 2 days.  Will forego TEE per infectious disease.  Needs PICC line at 72 hours postoperative if blood cultures remain negative.  Patient can receive IV antibiotics for the duration of her stay in inpatient rehab and transition to orals thereafter. -Infectious disease following -Antibiotics for 4 weeks, end date 07/30/2022 -Oxycodone and hydromorphone for pain control -Daily CBC -Follow-up with orthopedics 1 week after discharge  #Acute renal failure  Creatinine improved to baseline. -IVF as needed  #Decompensated cirrhosis 2/2 to alcohol use c/b esophageal varices  #Treated Hepatitis C (recent Hep C RNA negative) Patient notes that she receives  monthly paracenteses. Skin is jaundiced, sclera icteric.  Baseline mentation.  T bili much improved today. On admission, INR 1.4.  Her MELD Na is 26, 19.6% 3 month mortality, Child Pugh Class C. Abdomen distended today, though patient endorses modest improvement in discomfort. Appears stable clinically.  -Stable on home nadolol, Lasix, spironolactone -Continue lactulose 20 g 3 times daily -Continue to monitor patient's renal function  #History of seizures Patient reports that she has a history of seizures since 51 years old.  -Keppra 750 mg twice daily  #Schizophrenia #Anxiety Continue patient's home medications.  No dose adjustments needed for her klonopin in the setting of her renal failure or hepatic dysfunction -klonopin 0.5 mg twice daily as needed  -restart abilify  #History of thrombocytopenia Patient has received Nplate in the past.  Her platelet counts are normal this hospitalization. -Continue to monitor   Prior to Admission Living Arrangement: Living independently  Anticipated Discharge Location: Home  Barriers to Discharge: None Dispo: Anticipated discharge in approximately 3 day(s).   Linward Natal, MD 07/04/2022, 3:53 PM After 5pm on weekdays and 1pm on weekends: On Call pager [phone]

## 2022-07-04 NOTE — Progress Notes (Signed)
Inpatient Rehabilitation Admissions Coordinator   Noted ID recommendations for antibiotics. We will discuss with Rehab MD and follow up Friday for possible admit if approved. Thanks for the update.  Danne Baxter, RN, MSN Rehab Admissions Coordinator [phone] 07/04/2022 2:24 PM

## 2022-07-04 NOTE — Progress Notes (Addendum)
  Inpatient Rehabilitation Admissions Coordinator   Noted need for extended long term antibiotics with end date 07/30/22. I will discuss with rehab MD for she may need SNF due to need for long term placement to complete.  Danne Baxter, RN, MSN Rehab Admissions Coordinator [phone] 07/04/2022 8:25 AM  Discussed with Dr Dema Severin of need for clarification of IV to Po antibiotic options . He will clarify with ID and let me know.  Danne Baxter, RN, MSN Rehab Admissions Coordinator [phone] 07/04/2022 11:47 AM

## 2022-07-04 NOTE — Progress Notes (Signed)
Brief ID Note:   We were contacted by primary team this morning regarding inpatient rehab concern for patient's reported history of polysubstance use.    There is concern that she would be unsafe for discharge with a PICC line if she were to be discharged from CIR prior to her antibiotic end date of 07/30/2022.  They are anticipating that she will be in rehab for 7 to 10 days.  Discussed with primary team resident and pharmacy.  Recommend the following: 1.  Continue daptomycin as is while in the hospital and at rehab 2.  Contact either myself or the inpatient ID team at the time of discharge from CIR.  Depending on the date of CIR discharge, will plan for a dose of oritavancin at discharge followed by starting oral linezolid 7 days later until 11/21 or will just transition to linezolid at that time.  Would ultimately like to avoid a prolonged linezolid course in her. 3.  Keep follow up appointment with myself at [facility]. 4.  This should alleviate concerns related to PICC line going home.    Raynelle Highland for Infectious Disease [facility] 07/04/2022, 1:43 PM

## 2022-07-04 NOTE — Plan of Care (Signed)
  Problem: Education: Goal: Knowledge of General Education information will improve Description: Including pain rating scale, medication(s)/side effects and non-pharmacologic comfort measures Outcome: Progressing   Problem: Health Behavior/Discharge Planning: Goal: Ability to manage health-related needs will improve Outcome: Progressing   Problem: Clinical Measurements: Goal: Ability to maintain clinical measurements within normal limits will improve Outcome: Progressing Goal: Will remain free from infection Outcome: Progressing Goal: Diagnostic test results will improve Outcome: Progressing Goal: Respiratory complications will improve Outcome: Progressing Goal: Cardiovascular complication will be avoided Outcome: Progressing   Problem: Activity: Goal: Risk for activity intolerance will decrease Outcome: Progressing   Problem: Nutrition: Goal: Adequate nutrition will be maintained Outcome: Progressing   Problem: Coping: Goal: Level of anxiety will decrease Outcome: Progressing   Problem: Elimination: Goal: Will not experience complications related to bowel motility Outcome: Progressing Goal: Will not experience complications related to urinary retention Outcome: Progressing   Problem: Pain Managment: Goal: General experience of comfort will improve Outcome: Progressing   Problem: Safety: Goal: Ability to remain free from injury will improve Outcome: Progressing   Problem: Skin Integrity: Goal: Risk for impaired skin integrity will decrease Outcome: Progressing   Problem: Education: Goal: Knowledge of the prescribed therapeutic regimen will improve Outcome: Progressing Goal: Ability to verbalize activity precautions or restrictions will improve Outcome: Progressing Goal: Understanding of discharge needs will improve Outcome: Progressing   Problem: Activity: Goal: Ability to perform//tolerate increased activity and mobilize with assistive devices will  improve Outcome: Progressing   Problem: Clinical Measurements: Goal: Postoperative complications will be avoided or minimized Outcome: Progressing   Problem: Self-Care: Goal: Ability to meet self-care needs will improve Outcome: Progressing   Problem: Self-Concept: Goal: Ability to maintain and perform role responsibilities to the fullest extent possible will improve Outcome: Progressing   Problem: Pain Management: Goal: Pain level will decrease with appropriate interventions Outcome: Progressing   Problem: Skin Integrity: Goal: Demonstration of wound healing without infection will improve Outcome: Progressing   

## 2022-07-05 ENCOUNTER — Ambulatory Visit: Payer: Medicaid Other | Admitting: Plastic Surgery

## 2022-07-05 ENCOUNTER — Encounter (HOSPITAL_COMMUNITY): Payer: Self-pay | Admitting: Physical Medicine and Rehabilitation

## 2022-07-05 ENCOUNTER — Inpatient Hospital Stay: Payer: Self-pay

## 2022-07-05 ENCOUNTER — Inpatient Hospital Stay (HOSPITAL_COMMUNITY)
Admission: RE | Admit: 2022-07-05 | Discharge: 2022-07-22 | DRG: 091 | Disposition: A | Discharge status: Home / Self Care | Payer: No Typology Code available for payment source | Source: Intra-hospital | Attending: Physical Medicine and Rehabilitation | Admitting: Physical Medicine and Rehabilitation

## 2022-07-05 ENCOUNTER — Other Ambulatory Visit: Payer: Self-pay

## 2022-07-05 DIAGNOSIS — M86271 Subacute osteomyelitis, right ankle and foot: Secondary | ICD-10-CM | POA: Diagnosis not present

## 2022-07-05 DIAGNOSIS — Z79899 Other long term (current) drug therapy: Secondary | ICD-10-CM

## 2022-07-05 DIAGNOSIS — R7881 Bacteremia: Secondary | ICD-10-CM | POA: Diagnosis present

## 2022-07-05 DIAGNOSIS — F413 Other mixed anxiety disorders: Secondary | ICD-10-CM | POA: Diagnosis present

## 2022-07-05 DIAGNOSIS — K219 Gastro-esophageal reflux disease without esophagitis: Secondary | ICD-10-CM | POA: Diagnosis present

## 2022-07-05 DIAGNOSIS — Z9151 Personal history of suicidal behavior: Secondary | ICD-10-CM

## 2022-07-05 DIAGNOSIS — G546 Phantom limb syndrome with pain: Secondary | ICD-10-CM | POA: Diagnosis present

## 2022-07-05 DIAGNOSIS — M7989 Other specified soft tissue disorders: Secondary | ICD-10-CM | POA: Diagnosis not present

## 2022-07-05 DIAGNOSIS — R7989 Other specified abnormal findings of blood chemistry: Secondary | ICD-10-CM | POA: Diagnosis present

## 2022-07-05 DIAGNOSIS — D693 Immune thrombocytopenic purpura: Secondary | ICD-10-CM | POA: Diagnosis present

## 2022-07-05 DIAGNOSIS — R5381 Other malaise: Secondary | ICD-10-CM | POA: Diagnosis present

## 2022-07-05 DIAGNOSIS — Z23 Encounter for immunization: Secondary | ICD-10-CM | POA: Diagnosis not present

## 2022-07-05 DIAGNOSIS — Z6831 Body mass index (BMI) 31.0-31.9, adult: Secondary | ICD-10-CM | POA: Diagnosis not present

## 2022-07-05 DIAGNOSIS — K7031 Alcoholic cirrhosis of liver with ascites: Secondary | ICD-10-CM | POA: Diagnosis present

## 2022-07-05 DIAGNOSIS — E43 Unspecified severe protein-calorie malnutrition: Secondary | ICD-10-CM | POA: Diagnosis present

## 2022-07-05 DIAGNOSIS — N1831 Chronic kidney disease, stage 3a: Secondary | ICD-10-CM | POA: Diagnosis present

## 2022-07-05 DIAGNOSIS — R2689 Other abnormalities of gait and mobility: Secondary | ICD-10-CM | POA: Diagnosis present

## 2022-07-05 DIAGNOSIS — G40909 Epilepsy, unspecified, not intractable, without status epilepticus: Secondary | ICD-10-CM | POA: Diagnosis present

## 2022-07-05 DIAGNOSIS — R609 Edema, unspecified: Secondary | ICD-10-CM | POA: Diagnosis not present

## 2022-07-05 DIAGNOSIS — E876 Hypokalemia: Secondary | ICD-10-CM | POA: Diagnosis not present

## 2022-07-05 DIAGNOSIS — B1920 Unspecified viral hepatitis C without hepatic coma: Secondary | ICD-10-CM | POA: Diagnosis present

## 2022-07-05 DIAGNOSIS — Z89511 Acquired absence of right leg below knee: Secondary | ICD-10-CM

## 2022-07-05 DIAGNOSIS — G8918 Other acute postprocedural pain: Secondary | ICD-10-CM | POA: Diagnosis not present

## 2022-07-05 DIAGNOSIS — F319 Bipolar disorder, unspecified: Secondary | ICD-10-CM | POA: Diagnosis present

## 2022-07-05 DIAGNOSIS — N179 Acute kidney failure, unspecified: Secondary | ICD-10-CM | POA: Diagnosis present

## 2022-07-05 DIAGNOSIS — L89159 Pressure ulcer of sacral region, unspecified stage: Secondary | ICD-10-CM | POA: Diagnosis present

## 2022-07-05 DIAGNOSIS — M869 Osteomyelitis, unspecified: Secondary | ICD-10-CM | POA: Diagnosis present

## 2022-07-05 DIAGNOSIS — B9562 Methicillin resistant Staphylococcus aureus infection as the cause of diseases classified elsewhere: Secondary | ICD-10-CM | POA: Diagnosis present

## 2022-07-05 DIAGNOSIS — R52 Pain, unspecified: Secondary | ICD-10-CM | POA: Diagnosis not present

## 2022-07-05 DIAGNOSIS — N183 Chronic kidney disease, stage 3 unspecified: Secondary | ICD-10-CM | POA: Diagnosis present

## 2022-07-05 DIAGNOSIS — R040 Epistaxis: Secondary | ICD-10-CM | POA: Diagnosis not present

## 2022-07-05 LAB — CULTURE, BLOOD (ROUTINE X 2)
Culture: NO GROWTH
Culture: NO GROWTH

## 2022-07-05 LAB — GLUCOSE, CAPILLARY
Glucose-Capillary: 102 mg/dL — ABNORMAL HIGH (ref 70–99)
Glucose-Capillary: 83 mg/dL (ref 70–99)

## 2022-07-05 LAB — CBC
HCT: 36.3 % (ref 36.0–46.0)
Hemoglobin: 12 g/dL (ref 12.0–15.0)
MCH: 28.7 pg (ref 26.0–34.0)
MCHC: 33.1 g/dL (ref 30.0–36.0)
MCV: 86.8 fL (ref 80.0–100.0)
Platelets: 164 10*3/uL (ref 150–400)
RBC: 4.18 MIL/uL (ref 3.87–5.11)
WBC: 9.6 10*3/uL (ref 4.0–10.5)
nRBC: 0 % (ref 0.0–0.2)

## 2022-07-05 LAB — COMPREHENSIVE METABOLIC PANEL
ALT: 45 U/L — ABNORMAL HIGH (ref 0–44)
AST: 68 U/L — ABNORMAL HIGH (ref 15–41)
Albumin: 1.9 g/dL — ABNORMAL LOW (ref 3.5–5.0)
Alkaline Phosphatase: 367 U/L — ABNORMAL HIGH (ref 38–126)
Anion gap: 8 (ref 5–15)
BUN: 15 mg/dL (ref 6–20)
CO2: 30 mmol/L (ref 22–32)
Calcium: 9.3 mg/dL (ref 8.9–10.3)
Chloride: 97 mmol/L — ABNORMAL LOW (ref 98–111)
Creatinine, Ser: 0.64 mg/dL (ref 0.44–1.00)
GFR, Estimated: >60 mL/min (ref >60)
Glucose, Bld: 86 mg/dL (ref 70–99)
Potassium: 3.9 mmol/L (ref 3.5–5.1)
Sodium: 135 mmol/L (ref 135–145)
Total Bilirubin: 3.7 mg/dL — ABNORMAL HIGH (ref 0.3–1.2)
Total Protein: 5.7 g/dL — ABNORMAL LOW (ref 6.5–8.1)

## 2022-07-05 MED ORDER — FUROSEMIDE 40 MG PO TABS
40.0000 mg | ORAL_TABLET | Freq: Every day | ORAL | Status: DC
  Administered 2022-07-06 – 2022-07-22 (×17): 40 mg via ORAL
  Filled 2022-07-05 (×17): qty 1

## 2022-07-05 MED ORDER — PNEUMOCOCCAL VAC POLYVALENT 25 MCG/0.5ML IJ INJ
0.5000 mL | INJECTION | INTRAMUSCULAR | Status: DC
  Filled 2022-07-05: qty 0.5

## 2022-07-05 MED ORDER — THIAMINE MONONITRATE 100 MG PO TABS
100.0000 mg | ORAL_TABLET | Freq: Every day | ORAL | Status: DC
  Administered 2022-07-06 – 2022-07-22 (×17): 100 mg via ORAL
  Filled 2022-07-05 (×18): qty 1

## 2022-07-05 MED ORDER — ENOXAPARIN SODIUM 40 MG/0.4ML IJ SOSY: 40.0000 mg | PREFILLED_SYRINGE | INTRAMUSCULAR | Status: DC

## 2022-07-05 MED ORDER — DOCUSATE SODIUM 100 MG PO CAPS
100.0000 mg | ORAL_CAPSULE | Freq: Every day | ORAL | Status: DC
  Administered 2022-07-06 – 2022-07-13 (×7): 100 mg via ORAL
  Filled 2022-07-05 (×8): qty 1

## 2022-07-05 MED ORDER — PROCHLORPERAZINE EDISYLATE 10 MG/2ML IJ SOLN: 5.0000 mg | Freq: Four times a day (QID) | INTRAMUSCULAR | Status: DC | PRN

## 2022-07-05 MED ORDER — DIPHENHYDRAMINE HCL 12.5 MG/5ML PO ELIX: 12.5000 mg | ORAL_SOLUTION | Freq: Four times a day (QID) | ORAL | Status: DC | PRN

## 2022-07-05 MED ORDER — ALUM & MAG HYDROXIDE-SIMETH 200-200-20 MG/5ML PO SUSP: 30.0000 mL | ORAL | Status: DC | PRN

## 2022-07-05 MED ORDER — ZINC SULFATE 220 (50 ZN) MG PO CAPS
220.0000 mg | ORAL_CAPSULE | Freq: Every day | ORAL | Status: AC
  Administered 2022-07-06 – 2022-07-15 (×10): 220 mg via ORAL
  Filled 2022-07-05 (×10): qty 1

## 2022-07-05 MED ORDER — PROCHLORPERAZINE 25 MG RE SUPP: 12.5000 mg | Freq: Four times a day (QID) | RECTAL | Status: DC | PRN

## 2022-07-05 MED ORDER — SODIUM CHLORIDE 0.9% FLUSH: 10.0000 mL | INTRAVENOUS | Status: DC | PRN

## 2022-07-05 MED ORDER — CLONAZEPAM 0.5 MG PO TABS
0.5000 mg | ORAL_TABLET | Freq: Two times a day (BID) | ORAL | Status: DC | PRN
  Administered 2022-07-06 – 2022-07-21 (×13): 0.5 mg via ORAL
  Filled 2022-07-05 (×14): qty 1

## 2022-07-05 MED ORDER — METHOCARBAMOL 500 MG PO TABS
500.0000 mg | ORAL_TABLET | Freq: Four times a day (QID) | ORAL | Status: DC | PRN
  Administered 2022-07-07 – 2022-07-21 (×12): 500 mg via ORAL
  Filled 2022-07-05 (×13): qty 1

## 2022-07-05 MED ORDER — BANATROL TF EN LIQD
60.0000 mL | Freq: Two times a day (BID) | ENTERAL | Status: DC
  Administered 2022-07-05 – 2022-07-22 (×34): 60 mL via ORAL
  Filled 2022-07-05 (×35): qty 60

## 2022-07-05 MED ORDER — ADULT MULTIVITAMIN W/MINERALS CH
1.0000 | ORAL_TABLET | Freq: Every day | ORAL | Status: DC
  Administered 2022-07-06 – 2022-07-22 (×17): 1 via ORAL
  Filled 2022-07-05 (×17): qty 1

## 2022-07-05 MED ORDER — NADOLOL 20 MG PO TABS
20.0000 mg | ORAL_TABLET | Freq: Every morning | ORAL | Status: DC
  Administered 2022-07-06 – 2022-07-22 (×17): 20 mg via ORAL
  Filled 2022-07-05 (×17): qty 1

## 2022-07-05 MED ORDER — LEVETIRACETAM 250 MG PO TABS
750.0000 mg | ORAL_TABLET | Freq: Two times a day (BID) | ORAL | Status: DC
  Administered 2022-07-05 – 2022-07-22 (×34): 750 mg via ORAL
  Filled 2022-07-05 (×36): qty 3

## 2022-07-05 MED ORDER — SPIRONOLACTONE 25 MG PO TABS
100.0000 mg | ORAL_TABLET | Freq: Every day | ORAL | Status: DC
  Administered 2022-07-06 – 2022-07-22 (×17): 100 mg via ORAL
  Filled 2022-07-05 (×17): qty 4

## 2022-07-05 MED ORDER — CHLORHEXIDINE GLUCONATE CLOTH 2 % EX PADS
6.0000 | MEDICATED_PAD | Freq: Every day | CUTANEOUS | Status: DC
  Administered 2022-07-05: 6 via TOPICAL

## 2022-07-05 MED ORDER — HYDROMORPHONE HCL 2 MG PO TABS
4.0000 mg | ORAL_TABLET | ORAL | Status: DC | PRN
  Administered 2022-07-05 – 2022-07-15 (×35): 4 mg via ORAL
  Filled 2022-07-05 (×35): qty 2

## 2022-07-05 MED ORDER — ACETAMINOPHEN 325 MG PO TABS
325.0000 mg | ORAL_TABLET | ORAL | Status: DC | PRN
  Administered 2022-07-07 – 2022-07-11 (×3): 650 mg via ORAL
  Filled 2022-07-05 (×3): qty 2

## 2022-07-05 MED ORDER — INFLUENZA VAC SPLIT QUAD 0.5 ML IM SUSY
0.5000 mL | PREFILLED_SYRINGE | INTRAMUSCULAR | Status: AC
  Administered 2022-07-06: 0.5 mL via INTRAMUSCULAR
  Filled 2022-07-05: qty 0.5

## 2022-07-05 MED ORDER — ENOXAPARIN SODIUM 40 MG/0.4ML IJ SOSY
40.0000 mg | PREFILLED_SYRINGE | INTRAMUSCULAR | Status: DC
  Administered 2022-07-05 – 2022-07-09 (×4): 40 mg via SUBCUTANEOUS
  Filled 2022-07-05 (×5): qty 0.4

## 2022-07-05 MED ORDER — SODIUM CHLORIDE 0.9 % IV SOLN
500.0000 mg | Freq: Every day | INTRAVENOUS | Status: DC
  Administered 2022-07-05 – 2022-07-21 (×17): 500 mg via INTRAVENOUS
  Filled 2022-07-05 (×19): qty 10

## 2022-07-05 MED ORDER — SORBITOL 70 % SOLN: 30.0000 mL | Freq: Every day | Status: DC | PRN

## 2022-07-05 MED ORDER — JUVEN PO PACK
1.0000 | PACK | Freq: Two times a day (BID) | ORAL | Status: DC
  Administered 2022-07-05 – 2022-07-22 (×32): 1 via ORAL
  Filled 2022-07-05 (×23): qty 1

## 2022-07-05 MED ORDER — MAGNESIUM HYDROXIDE 400 MG/5ML PO SUSP
30.0000 mL | Freq: Every day | ORAL | Status: DC | PRN
  Filled 2022-07-05: qty 30

## 2022-07-05 MED ORDER — LACTULOSE 10 GM/15ML PO SOLN
20.0000 g | Freq: Two times a day (BID) | ORAL | Status: DC
  Filled 2022-07-05: qty 30

## 2022-07-05 MED ORDER — SODIUM CHLORIDE 0.9% FLUSH
10.0000 mL | Freq: Two times a day (BID) | INTRAVENOUS | Status: DC
  Administered 2022-07-05 – 2022-07-22 (×31): 10 mL

## 2022-07-05 MED ORDER — PROCHLORPERAZINE MALEATE 5 MG PO TABS: 5.0000 mg | ORAL_TABLET | Freq: Four times a day (QID) | ORAL | Status: DC | PRN

## 2022-07-05 MED ORDER — TRAZODONE HCL 50 MG PO TABS
25.0000 mg | ORAL_TABLET | Freq: Every evening | ORAL | Status: DC | PRN
  Administered 2022-07-07 – 2022-07-22 (×5): 50 mg via ORAL
  Filled 2022-07-05 (×5): qty 1

## 2022-07-05 MED ORDER — ARIPIPRAZOLE 10 MG PO TABS
10.0000 mg | ORAL_TABLET | Freq: Every day | ORAL | Status: DC
  Administered 2022-07-06 – 2022-07-22 (×17): 10 mg via ORAL
  Filled 2022-07-05 (×17): qty 1

## 2022-07-05 MED ORDER — FLEET ENEMA 7-19 GM/118ML RE ENEM: 1.0000 | ENEMA | Freq: Once | RECTAL | Status: DC | PRN

## 2022-07-05 MED ORDER — ENSURE ENLIVE PO LIQD
237.0000 mL | Freq: Two times a day (BID) | ORAL | Status: DC
  Administered 2022-07-05 – 2022-07-22 (×28): 237 mL via ORAL

## 2022-07-05 MED ORDER — FOLIC ACID 1 MG PO TABS
1.0000 mg | ORAL_TABLET | Freq: Every day | ORAL | Status: DC
  Administered 2022-07-06 – 2022-07-22 (×17): 1 mg via ORAL
  Filled 2022-07-05 (×17): qty 1

## 2022-07-05 MED ORDER — FLUOXETINE HCL 20 MG PO CAPS
40.0000 mg | ORAL_CAPSULE | Freq: Every day | ORAL | Status: DC
  Administered 2022-07-06 – 2022-07-16 (×11): 40 mg via ORAL
  Filled 2022-07-05 (×12): qty 2

## 2022-07-05 MED ORDER — PANTOPRAZOLE SODIUM 40 MG PO TBEC
40.0000 mg | DELAYED_RELEASE_TABLET | Freq: Every day | ORAL | Status: DC
  Administered 2022-07-06 – 2022-07-22 (×17): 40 mg via ORAL
  Filled 2022-07-05 (×17): qty 1

## 2022-07-05 MED ORDER — VITAMIN C 500 MG PO TABS
1000.0000 mg | ORAL_TABLET | Freq: Every day | ORAL | Status: DC
  Administered 2022-07-06 – 2022-07-22 (×17): 1000 mg via ORAL
  Filled 2022-07-05 (×18): qty 2

## 2022-07-05 MED ORDER — LACTULOSE 10 GM/15ML PO SOLN
20.0000 g | Freq: Two times a day (BID) | ORAL | Status: DC
  Administered 2022-07-05 – 2022-07-21 (×27): 20 g via ORAL
  Filled 2022-07-05 (×35): qty 30

## 2022-07-05 MED ORDER — GUAIFENESIN-DM 100-10 MG/5ML PO SYRP
5.0000 mL | ORAL_SOLUTION | Freq: Four times a day (QID) | ORAL | Status: DC | PRN
  Administered 2022-07-08: 10 mL via ORAL
  Filled 2022-07-05: qty 10

## 2022-07-05 NOTE — H&P (Signed)
Physical Medicine and Rehabilitation Admission H&P     CC: Debility secondary to right foot osteomyelitis and abscess status post right below the knee amputation   HPI: Briana Sweeney is a 51 year old female who presented to the emergency department complaining of swelling and redness to her right foot on 06/29/2022.  He had sustained an injury to her right foot approximately 1 month prior to presentation.  She was evaluated by primary care for provider and x-rays were negative for fractures.  He reported she then was evaluated by orthopedic surgeon advised her to proceed to the emergency department for suspected cellulitis.  She was started on broad-spectrum antibiotics.  She received fluid resuscitation for AKI.  Imaging confirmed osteomyelitis of the right foot with tenosynovitis and abscesses.  Antibiotics were changed to [place].  Orthopedic surgery was consulted and she underwent right below the knee amputation by Dr. Sharol Given on 10/24.  Infectious disease consultation was obtained on 10/22 and recommended antibiotics for 4 weeks with an end date of 07/30/2022.  Due to history of prior substance abuse, infectious disease recommends she continue on daptomycin via PICC while in CIR and plan for a dose of oritavancin at discharge followed by starting oral linezolid for 7 days until 11/21 or will just transition to linezolid at that time. The patient requires inpatient physical medicine and rehabilitation evaluations and treatment secondary to dysfunction due to right below the knee amputation.   Her past medical history is significant for alcoholic cirrhosis with recurrent ascites, esophageal varices, hepatitis C and seizure disorder.  She reports receiving monthly paracenteses.  Her record states her MELD score is 26 Child-Pugh class C.  Reports last alcohol use was in July 2022. Maintenance data blocker and diuretics.  She is maintained on Keppra and states she is had no recent seizure activity.      ROS     Past Medical History:  Diagnosis Date   Abscess of bursa of left elbow 09/23/2018   Acute hyperactive alcohol withdrawal delirium ([place]) 10/09/2017   Acute low back pain with bilateral sciatica     Acute metabolic encephalopathy 41/96/2229   Acute on chronic pancreatitis ([place]) 04/04/2021   AKI (acute kidney injury) ([place]) 04/04/2021   Alcohol withdrawal syndrome with complication ([place]) 79/89/2119   Alcohol-induced acute pancreatitis     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[place]) 12/05/2018   Ascites due to alcoholic cirrhosis ([place])     Bipolar affective disorder ([place])      With anxiety features   Bunionette of right foot 11/2019   Cirrhosis of liver ([place])      Due to alcohol and hepatitis C   Cocaine dependence without complication ([place]) 41/74/0814   Decompensated hepatic cirrhosis ([place]) 07/23/2017   Depression with anxiety     Epistaxis 01/20/2019   Erysipelas 09/13/2018   Esophageal 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 ([place])     Esophageal varices without bleeding (HCC)     ETOHism (HCC)     GERD (gastroesophageal reflux disease)     Head injury     Hematemesis 02/10/2018   Hepatitis C 2018    hepatitis c and alcohol related hepatitis   Heroin abuse ([place])     History of blood transfusion      "blood doesn't clot; I fell down and had to have a transfusion"   History of kidney stones     Menopause 2016   Migraine      "when I get really stressed" (09/01/2017)   Neuropathy 10/27/2018  Olecranon bursitis of left elbow     Other mixed anxiety disorders 10/27/2018   Overdose 08/22/2018   Pancytopenia ([place]) 07/23/2017   Polysubstance abuse ([place]) 04/08/2018   Portal hypertension ([place]) 03/18/2018   S/P foot surgery, right 11/2019   Schizophrenia ([place])     Seizures ([place])      "when I run out of my RX; lots recently" (09/01/2017)   Suicidal ideation     Thrombocytopenia ([place])     Trichimoniasis 10/30/2017   Upper GI bleed 02/10/2018   Urinary urgency 05/2020   UTI (urinary tract infection)  06/02/2017         Past Surgical History:  Procedure Laterality Date   AMPUTATION Right 07/02/2022    Procedure: RIGHT BELOW KNEE AMPUTATION;  Surgeon: Newt Minion, MD;  Location: [facility];  Service: Orthopedics;  Laterality: Right;   APPLICATION OF WOUND VAC Right 07/02/2022    Procedure: APPLICATION OF WOUND VAC;  Surgeon: Newt Minion, MD;  Location: [facility];  Service: Orthopedics;  Laterality: Right;   BUNIONECTOMY       ESOPHAGEAL BANDING   06/26/2021    Procedure: ESOPHAGEAL BANDING;  Surgeon: Jackquline Denmark, MD;  Location: [facility] ENDOSCOPY;  Service: Endoscopy;;   ESOPHAGOGASTRODUODENOSCOPY N/A 09/03/2017    Procedure: ESOPHAGOGASTRODUODENOSCOPY (EGD);  Surgeon: Doran Stabler, MD;  Location: [facility];  Service: Gastroenterology;  Laterality: N/A;   ESOPHAGOGASTRODUODENOSCOPY N/A 02/01/2022    Procedure: ESOPHAGOGASTRODUODENOSCOPY (EGD);  Surgeon: Clarene Essex, MD;  Location: [facility];  Service: Gastroenterology;  Laterality: N/A;   ESOPHAGOGASTRODUODENOSCOPY (EGD) WITH PROPOFOL N/A 06/26/2021    Procedure: ESOPHAGOGASTRODUODENOSCOPY (EGD) WITH PROPOFOL;  Surgeon: Jackquline Denmark, MD;  Location: [facility] ENDOSCOPY;  Service: Endoscopy;  Laterality: N/A;   FINGER FRACTURE SURGERY Left      "shattered my pinky"   FRACTURE SURGERY       I & D EXTREMITY Left 09/18/2018    Procedure: IRRIGATION AND DEBRIDEMENT EXTREMITY;  Surgeon: Leanora Cover, MD;  Location: [facility];  Service: Orthopedics;  Laterality: Left;   IR PARACENTESIS   07/23/2017   IR PARACENTESIS   07/2017    "did it twice in the same week" (09/01/2017)   IR PARACENTESIS   06/26/2021   SHOULDER OPEN ROTATOR CUFF REPAIR Right     TOOTH EXTRACTION   08/2019   TUBAL LIGATION       VAGINAL HYSTERECTOMY             Family History  Problem Relation Age of Onset   Lung cancer Mother 20   Alcohol abuse Mother     Throat cancer Father 36    Social History:  reports that she has never smoked. She has never used smokeless tobacco.  She reports current alcohol use of about 6.0 standard drinks of alcohol per week. She reports that she does not currently use drugs. Allergies:       Allergies  Allergen Reactions   Other Other (See Comments)      Platelets: Rx chest pain, tremors, body aches   Patient stated that she receives platelet injection every Wednesday           Medications Prior to Admission  Medication Sig Dispense Refill   ARIPiprazole (ABILIFY) 10 MG tablet Take 1 tablet by mouth every day (Patient taking differently: Take 10 mg by mouth every morning.) 30 tablet 2   clonazePAM (KLONOPIN) 0.5 MG tablet TAKE 1 TABLET BY MOUTH TWICE DAILY (Patient taking differently: Take 0.5 mg by mouth 2 (two)  times daily as needed for anxiety.) 60 tablet 2   FLUoxetine (PROZAC) 40 MG capsule Take 1 capsule (40 mg total) by mouth daily. (Patient taking differently: Take 40 mg by mouth every morning.) 30 capsule 2   furosemide (LASIX) 40 MG tablet Take 40 mg by mouth daily.       ibuprofen (ADVIL) 800 MG tablet Take 800 mg by mouth as needed for mild pain or moderate pain.       lactulose (CHRONULAC) 10 GM/15ML solution Take 30 mLs (20 g total) by mouth 3 (three) times daily.       lactulose, encephalopathy, (CHRONULAC) 10 GM/15ML SOLN Take by mouth.       levETIRAcetam (KEPPRA) 750 MG tablet Take 1 tablet (750 mg total) by mouth 2 (two) times daily. 180 tablet 3   nadolol (CORGARD) 20 MG tablet Take 1 tablet (20 mg total) by mouth every morning. 30 tablet 0   pantoprazole (PROTONIX) 40 MG tablet Take 40 mg by mouth daily.       predniSONE (DELTASONE) 50 MG tablet Take by mouth.       spironolactone (ALDACTONE) 50 MG tablet Take 100 mg by mouth daily.       acamprosate (CAMPRAL) 333 MG tablet Take 2 tablets (666 mg total) by mouth 3 (three) times daily (Patient not taking: Reported on 05/13/2022) 180 tablet 0   amoxicillin-clavulanate (AUGMENTIN) 875-125 MG tablet Take 1 tablet by mouth 2 (two) times daily. (Patient not taking:  Reported on 07/01/2022) 20 tablet 0   Multiple Vitamins-Minerals (MULTIVITAMIN WITH MINERALS) tablet Take 1 tablet by mouth daily. (Patient not taking: Reported on 07/01/2022)       OVER THE COUNTER MEDICATION Take 1 capsule by mouth daily. OLLY Ultra brain soft gel (Patient not taking: Reported on 07/01/2022)       potassium chloride (KLOR-CON) 20 MEQ packet Take 1 packet (20 mEq) by mouth daily as directed (Patient not taking: Reported on 07/01/2022) 30 each 0   thiamine 100 MG tablet Take 1 tablet (100 mg total) by mouth daily. (Patient not taking: Reported on 07/01/2022) 30 tablet 0          Home: Home Living Family/patient expects to be discharged to:: Private residence Living Arrangements: Alone Available Help at Discharge: Neighbor, Available PRN/intermittently Type of Home:  (handicapped apartment housing) Home Access: Elevator Home Layout: One level Bathroom Shower/Tub: Chiropodist: Standard Bathroom Accessibility: Yes Home Equipment: Grab bars - tub/shower, Grab bars - toilet, Wheelchair - manual  Lives With: Alone   Functional History: Prior Function Prior Level of Function : Independent/Modified Independent Mobility Comments: several falls reported after Rt foot infection   Functional Status:  Mobility: Bed Mobility Overal bed mobility: Needs Assistance Bed Mobility: Rolling, Sidelying to Sit, Sit to Sidelying Rolling: Supervision Sidelying to sit: Supervision Sit to sidelying: Supervision General bed mobility comments: Educated on bed mobility techniques including rolling with min assist, use of hand rail as needed, Mod assist for trunk and RLE support to EOB. Pt able to scoot to EOB in seated position with min assist, and slide self up in bed with min assist using bridge with LLE to push and UEs to pull HOB rail. Transfers Overall transfer level: Needs assistance Equipment used: Rolling walker (2 wheels) Transfers: Sit to/from Stand, Bed to  chair/wheelchair/BSC Sit to Stand: Min assist Bed to/from chair/wheelchair/BSC transfer type:: Lateral/scoot transfer  Lateral/Scoot Transfers: Min assist General transfer comment: assist to boost into standing and cues for hand placement.  Unable to pivot on L foot this session. Able to laterally scoot towards HOB with minA   ADL: ADL Overall ADL's : Needs assistance/impaired Eating/Feeding: Independent, Bed level Grooming: Wash/dry hands, Wash/dry face, Min guard, Sitting Upper Body Bathing: Min guard, Sitting Lower Body Bathing: +2 for safety/equipment, Bed level Upper Body Dressing : Min guard, Sitting Lower Body Dressing: Minimal assistance, Bed level Toilet Transfer: Moderate assistance, Stand-pivot, BSC/3in1   Cognition: Cognition Overall Cognitive Status: No family/caregiver present to determine baseline cognitive functioning Orientation Level: Oriented X4 Cognition Arousal/Alertness: Awake/alert Behavior During Therapy: WFL for tasks assessed/performed Overall Cognitive Status: No family/caregiver present to determine baseline cognitive functioning General Comments: seems to demo slow processing and difficulty sequencing but no family to determine baseline cognition   Physical Exam: Blood pressure 118/81, pulse 76, temperature 98 F (36.7 C), temperature source Oral, resp. rate 16, height '4\' 11"'$  (1.499 m), weight 67 kg, SpO2 96 %. Physical Exam  Constitutional: No apparent distress. Appropriate appearance for age. Sitting upright in bed.  HENT: No JVD. Neck Supple. Trachea midline. Atraumatic, normocephalic. Eyes: PERRLA. EOMI. Visual fields grossly intact.  Cardiovascular: RRR, no murmurs/rub/gallops. 1+ Edema LUE. Peripheral pulse LLE 2+  Respiratory: CTAB. No rales, rhonchi, or wheezing. On RA.  Abdomen: + bowel sounds, normoactive. Moderate distention, no tenderness.  Skin: R distal surgical site with wound vac, good suction/seal, draining scan serosanguinous fluid.   MSK:      S/p R BKA - in extension brace in bed.       Strength:                RUE: 5-/5 SA, 5/5 EF, 5/5 EE, 5/5 WE, 5/5 FF, 5/5 FA                 LUE: 5-/5 SA, 5/5 EF, 5/5 EE, 5/5 WE, 5/5 FF, 5/5 FA                 RLE: 4/5 HF, 4-/5 KE - limited by pain                LLE:  5/5 HF, 5/5 KE, 5/5 DF, 5/5 EHL, 5/5 PF   Neurologic exam:  Cognition: AAO to person, place, time and event. Cannot spell WORLD backward, cannot perform basic addition. Confused with comparison (how is an apple different than an orange - they're both round x2, then changes to "vitamin C") Language: Fluent, No substitutions or neoglisms. No dysarthria. Names 3/3 objects correctly.  Memory: Recalls 0/3 objects at 5 minutes.  Insight: Good insight into current condition.  Mood: Flat affect, anxious mood.  Sensation: To light touch intact in BL UEs and LEs  Reflexes: 2+ in BL UE and LEs. CN: 2-12 grossly intact.  Coordination: No apparent tremors. No ataxia on FTN Spasticity: MAS 0 in all extremities.    Lab Results Last 48 Hours        Results for orders placed or performed during the hospital encounter of 06/29/22 (from the past 48 hour(s))  CBC     Status: Abnormal    Collection Time: 07/04/22  4:49 AM  Result Value Ref Range    WBC 14.4 (H) 4.0 - 10.5 K/uL    RBC 4.07 3.87 - 5.11 MIL/uL    Hemoglobin 11.4 (L) 12.0 - 15.0 g/dL    HCT 35.7 (L) 36.0 - 46.0 %    MCV 87.7 80.0 - 100.0 fL    MCH 28.0 26.0 - 34.0 pg    MCHC 31.9  30.0 - 36.0 g/dL    RDW Not Measured 11.5 - 15.5 %    Platelets 165 150 - 400 K/uL      Comment: REPEATED TO VERIFY    nRBC 0.0 0.0 - 0.2 %      Comment: Performed at [facility], [place] [address] [place], [place] [address]  Comprehensive metabolic panel     Status: Abnormal    Collection Time: 07/04/22  4:49 AM  Result Value Ref Range    Sodium 134 (L) 135 - 145 mmol/L    Potassium 4.7 3.5 - 5.1 mmol/L    Chloride 99 98 - 111 mmol/L    CO2 27 22 - 32 mmol/L    Glucose,  Bld 109 (H) 70 - 99 mg/dL      Comment: Glucose reference range applies only to samples taken after fasting for at least 8 hours.    BUN 16 6 - 20 mg/dL    Creatinine, Ser 0.48 0.44 - 1.00 mg/dL    Calcium 8.8 (L) 8.9 - 10.3 mg/dL    Total Protein 5.4 (L) 6.5 - 8.1 g/dL    Albumin 1.8 (L) 3.5 - 5.0 g/dL    AST 55 (H) 15 - 41 U/L    ALT 32 0 - 44 U/L    Alkaline Phosphatase 316 (H) 38 - 126 U/L    Total Bilirubin 3.9 (H) 0.3 - 1.2 mg/dL    GFR, Estimated >60 >60 mL/min      Comment: (NOTE) Calculated using the CKD-EPI Creatinine Equation (2021)      Anion gap 8 5 - 15      Comment: Performed at [facility], [place] [address], [place], [place] [address]  Glucose, capillary     Status: None    Collection Time: 07/04/22  7:36 AM  Result Value Ref Range    Glucose-Capillary 84 70 - 99 mg/dL      Comment: Glucose reference range applies only to samples taken after fasting for at least 8 hours.  Glucose, capillary     Status: None    Collection Time: 07/04/22 11:38 AM  Result Value Ref Range    Glucose-Capillary 86 70 - 99 mg/dL      Comment: Glucose reference range applies only to samples taken after fasting for at least 8 hours.  Glucose, capillary     Status: Abnormal    Collection Time: 07/04/22  4:11 PM  Result Value Ref Range    Glucose-Capillary 69 (L) 70 - 99 mg/dL      Comment: Glucose reference range applies only to samples taken after fasting for at least 8 hours.  Glucose, capillary     Status: None    Collection Time: 07/04/22  4:21 PM  Result Value Ref Range    Glucose-Capillary 73 70 - 99 mg/dL      Comment: Glucose reference range applies only to samples taken after fasting for at least 8 hours.  Glucose, capillary     Status: Abnormal    Collection Time: 07/04/22  8:50 PM  Result Value Ref Range    Glucose-Capillary 110 (H) 70 - 99 mg/dL      Comment: Glucose reference range applies only to samples taken after fasting for at least 8 hours.  CBC     Status: None     Collection Time: 07/05/22  6:07 AM  Result Value Ref Range    WBC 9.6 4.0 - 10.5 K/uL    RBC 4.18  3.87 - 5.11 MIL/uL    Hemoglobin 12.0 12.0 - 15.0 g/dL    HCT 36.3 36.0 - 46.0 %    MCV 86.8 80.0 - 100.0 fL    MCH 28.7 26.0 - 34.0 pg    MCHC 33.1 30.0 - 36.0 g/dL    RDW Not Measured 11.5 - 15.5 %    Platelets 164 150 - 400 K/uL      Comment: REPEATED TO VERIFY    nRBC 0.0 0.0 - 0.2 %      Comment: Performed at [facility], [place] [address], [place], [place] [address]  Comprehensive metabolic panel     Status: Abnormal    Collection Time: 07/05/22  6:07 AM  Result Value Ref Range    Sodium 135 135 - 145 mmol/L    Potassium 3.9 3.5 - 5.1 mmol/L    Chloride 97 (L) 98 - 111 mmol/L    CO2 30 22 - 32 mmol/L    Glucose, Bld 86 70 - 99 mg/dL      Comment: Glucose reference range applies only to samples taken after fasting for at least 8 hours.    BUN 15 6 - 20 mg/dL    Creatinine, Ser 0.64 0.44 - 1.00 mg/dL    Calcium 9.3 8.9 - 10.3 mg/dL    Total Protein 5.7 (L) 6.5 - 8.1 g/dL    Albumin 1.9 (L) 3.5 - 5.0 g/dL    AST 68 (H) 15 - 41 U/L    ALT 45 (H) 0 - 44 U/L    Alkaline Phosphatase 367 (H) 38 - 126 U/L    Total Bilirubin 3.7 (H) 0.3 - 1.2 mg/dL    GFR, Estimated >60 >60 mL/min      Comment: (NOTE) Calculated using the CKD-EPI Creatinine Equation (2021)      Anion gap 8 5 - 15      Comment: Performed at [facility], [place] [address] [place], [place] [address]  Glucose, capillary     Status: Abnormal    Collection Time: 07/05/22  7:36 AM  Result Value Ref Range    Glucose-Capillary 102 (H) 70 - 99 mg/dL      Comment: Glucose reference range applies only to samples taken after fasting for at least 8 hours.    *Note: Due to a large number of results and/or encounters for the requested time period, some results have not been displayed. A complete set of results can be found in Results Review.       Imaging Results (Last 48 hours)  [place] EKG SITE RITE   Result  Date: 07/05/2022 If Site Rite image not attached, placement could not be confirmed due to current cardiac rhythm.          Blood pressure 118/81, pulse 76, temperature 98 F (36.7 C), temperature source Oral, resp. rate 16, height '4\' 11"'$  (1.499 m), weight 67 kg, SpO2 96 %.   Medical Problem List and Plan: 1. Functional deficits secondary to right BKA             -patient may shower with tubing clamped, vac disconnected. Do not submerge.              -ELOS/Goals: 10-14 days             - Lives alone in handicapped accessible 1 floor apartment    2.  Antithrombotics: -DVT/anticoagulation:  Pharmaceutical: Lovenox             -antiplatelet  therapy: none             -LUE duplex ordered on admission for edema   3. Pain Management: Tylenol, Robaxin, Dilaudid 4 mg q 4 hours as needed   4. Mood/Behavior/Sleep: LCSW to evaluate and provide emotional support             -h/o bipolar>>continue Abilify, Prozac             -h/o anxiety>>continue clonazepam 0.5 mg BID as needed             -antipsychotic agents: n/a   5. Neuropsych/cognition: This patient is capable of making decisions on her own behalf.             -SLP eval for difficulty with higher cognitive tasks   6. Skin/Wound Care: Routine skin care checks             -  maintain negative pressure dressing dressing through next Tuesday 10/31             - Wound vac per ortho              7. 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 Juleen China: At discharge from rehab, DC PICC after 1x dose of oritavancin; then transition to oral linezolid to complete course at 67/89   9: Alcoholic cirrhosis/ascites/elevated LFTs/esophageal varices:             -continue Lasix 40 mg daily             -continue lactulose  20 grams BID (goal 2-3 bowel movements daily)             -continue nadolol 20 mg daily             -continue aldactone 100 mg daily             -continue Folvite, thiamine supplementation             -Gets OP Qmonthly ascites taps; will need to arrange with IR while admitted   10: Seizure disorder: continue Keppra  11. History polysubstance abuse            - Hx alcohol, THC - none current per patient            - Plan to DC picc prior to discharge as above  12. AKI on CKD stage 3. Admission labs. Baseline Cr Appears 0.4-0.7.     Barbie Banner, PA-C 07/05/2022   I have examined the patient independently and edited the note for HPI, ROS, exam, assessment, and plan as appropriate. I am in agreement with the above recommendations.   Gertie Gowda, DO 07/05/2022

## 2022-07-05 NOTE — Progress Notes (Signed)
Pt. Discharged to rehab 4 M via bed in stable condition with all belongings  Anastasio Auerbach

## 2022-07-05 NOTE — Progress Notes (Signed)
Patient ID: Briana Sweeney, female   DOB: 03-10-1971, 51 y.o.   MRN: [id number] Patient is status post transtibial amputation.  There is no drainage in the wound VAC canister there is a good suction fit.  Patient working well with therapy.  Anticipate she can be discharged to inpatient rehab.

## 2022-07-05 NOTE — Progress Notes (Signed)
Occupational Therapy Treatment Patient Details Name: Briana Sweeney MRN: [id number] DOB: 05-Jul-1971 Today's Date: 07/05/2022   History of present illness 51 y.o. female admitted 10/21 with Cellulitis and osteomyelitis of right foot, Acute renal failure superimposed on stage 3a chronic kidney disease, Severe protein-calorie malnutrition, Hepatic cirrhosis. 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   OT comments  Patient with good improvement toward patient focused goals.  Good balance sitting edge of bed, generalized supervision.  Patient much more confident this date regarding standing, and pivoting to bedside commode, Min a and cues for sequencing.  Patient will need continued rehab post acute to attain Mod I at wheelchair level, AIR continues to be recommended.  OT will continue efforts in the acute setting.     Recommendations for follow up therapy are one component of a multi-disciplinary discharge planning process, led by the attending physician.  Recommendations may be updated based on patient status, additional functional criteria and insurance authorization.    Follow Up Recommendations  Acute inpatient rehab (3hours/day)    Assistance Recommended at Discharge Frequent or constant Supervision/Assistance  Patient can return home with the following  A lot of help with walking and/or transfers;A little help with bathing/dressing/bathroom;Assistance with cooking/housework;Help with stairs or ramp for entrance;Assist for transportation   Equipment Recommendations  Wheelchair (measurements OT);Wheelchair cushion (measurements OT);BSC/3in1    Recommendations for Other Services      Precautions / Restrictions Precautions Precautions: Fall Restrictions Weight Bearing Restrictions: Yes RLE Weight Bearing: Non weight bearing       Mobility Bed Mobility Overal bed mobility: Needs Assistance Bed Mobility: Supine to Sit, Sit to Supine   Sidelying to sit: Supervision     Sit to sidelying:  Supervision      Transfers Overall transfer level: Needs assistance Equipment used: Rolling walker (2 wheels) Transfers: Sit to/from Stand, Bed to chair/wheelchair/BSC Sit to Stand: Min assist Stand pivot transfers: Min assist               Balance Overall balance assessment: Needs assistance Sitting-balance support: No upper extremity supported, Feet supported Sitting balance-Leahy Scale: Good     Standing balance support: Bilateral upper extremity supported, Reliant on assistive device for balance Standing balance-Leahy Scale: Poor                             ADL either performed or assessed with clinical judgement   ADL       Grooming: Wash/dry hands;Wash/dry face;Sitting;Set up   Upper Body Bathing: Set up;Sitting   Lower Body Bathing: Set up;Bed level   Upper Body Dressing : Set up;Sitting   Lower Body Dressing: Set up;Bed level   Toilet Transfer: Minimal assistance;BSC/3in1;Stand-pivot;Rolling walker (2 wheels)   Toileting- Clothing Manipulation and Hygiene: Min guard;Sitting/lateral lean              Extremity/Trunk Assessment Upper Extremity Assessment Upper Extremity Assessment: Overall WFL for tasks assessed   Lower Extremity Assessment Lower Extremity Assessment: Defer to PT evaluation   Cervical / Trunk Assessment Cervical / Trunk Exceptions: BKA    Vision       Perception     Praxis      Cognition Arousal/Alertness: Awake/alert Behavior During Therapy: WFL for tasks assessed/performed Overall Cognitive Status: No family/caregiver present to determine baseline cognitive functioning Area of Impairment: Problem solving  Problem Solving: Slow processing, Requires verbal cues, Decreased initiation                       General Comments  VSS on RA    Pertinent Vitals/ Pain       Pain Assessment Pain Assessment: Faces Faces Pain Scale: Hurts little more Pain Location:  Rt residual limb Pain Descriptors / Indicators: Burning, Aching, Constant Pain Intervention(s): Monitored during session                                                          Frequency  Min 2X/week        Progress Toward Goals  OT Goals(current goals can now be found in the care plan section)  Progress towards OT goals: Progressing toward goals  Acute Rehab OT Goals OT Goal Formulation: With patient Time For Goal Achievement: 07/17/22 Potential to Achieve Goals: Good  Plan Discharge plan remains appropriate    Co-evaluation                 AM-PAC OT "6 Clicks" Daily Activity     Outcome Measure   Help from another person eating meals?: None Help from another person taking care of personal grooming?: None Help from another person toileting, which includes using toliet, bedpan, or urinal?: A Little Help from another person bathing (including washing, rinsing, drying)?: A Lot Help from another person to put on and taking off regular upper body clothing?: A Little Help from another person to put on and taking off regular lower body clothing?: A Lot 6 Click Score: 18    End of Session Equipment Utilized During Treatment: Gait belt;Rolling walker (2 wheels)  OT Visit Diagnosis: Unsteadiness on feet (R26.81);Muscle weakness (generalized) (M62.81);Pain Pain - Right/Left: Right Pain - part of body: Leg   Activity Tolerance Patient tolerated treatment well   Patient Left in bed;with call bell/phone within reach   Nurse Communication          Time: 1100-1120 OT Time Calculation (min): 20 min  Charges: OT General Charges $OT Visit: 1 Visit OT Treatments $Self Care/Home Management : 8-22 mins  07/05/2022  RP, OTR/L  Acute Rehabilitation Services  Office:  [phone]   Metta Clines 07/05/2022, 11:44 AM

## 2022-07-05 NOTE — Discharge Summary (Cosign Needed Addendum)
Name: Briana Sweeney MRN: [id number] DOB: 1970/12/31 51 y.o. PCP: Marco Collie, MD  Date of Admission: 06/29/2022  3:17 PM Date of Discharge: 07/05/2022 Attending Physician: No att. providers found  Discharge Diagnosis: 1. Principal Problem:   MRSA bacteremia Active Problems:   Acute renal failure superimposed on stage 3a chronic kidney disease (HCC)   Severe protein-calorie malnutrition (HCC)   Cellulitis of right foot   Hepatic cirrhosis (HCC)   Sepsis (HCC)   Foot osteomyelitis, right ([place])   Cutaneous abscess of right foot  Discharge Medications: Allergies as of 07/05/2022       Reactions   Other Other (See Comments)   Platelets: Rx chest pain, tremors, body aches Patient stated that she receives platelet injection every Wednesday         Medication List     STOP taking these medications    amoxicillin-clavulanate 875-125 MG tablet Commonly known as: AUGMENTIN   clonazePAM 0.5 MG tablet Commonly known as: KLONOPIN   ibuprofen 800 MG tablet Commonly known as: ADVIL   lactulose (encephalopathy) 10 GM/15ML Soln Commonly known as: CHRONULAC   lactulose 10 GM/15ML solution Commonly known as: CHRONULAC   levETIRAcetam 750 MG tablet Commonly known as: KEPPRA   multivitamin with minerals tablet   OVER THE COUNTER MEDICATION   potassium chloride 20 MEQ packet Commonly known as: KLOR-CON   predniSONE 50 MG tablet Commonly known as: DELTASONE   thiamine 100 MG tablet Commonly known as: VITAMIN B1       TAKE these medications    acamprosate 333 MG tablet Commonly known as: CAMPRAL Take 2 tablets (666 mg total) by mouth 3 (three) times daily   ARIPiprazole 10 MG tablet Commonly known as: ABILIFY Take 1 tablet by mouth every day What changed:  how much to take how to take this when to take this   FLUoxetine 40 MG capsule Commonly known as: PROZAC Take 1 capsule (40 mg total) by mouth daily. What changed: when to take this    furosemide 40 MG tablet Commonly known as: LASIX Take 40 mg by mouth daily.   nadolol 20 MG tablet Commonly known as: Corgard Take 1 tablet (20 mg total) by mouth every morning.   pantoprazole 40 MG tablet Commonly known as: PROTONIX Take 40 mg by mouth daily.   spironolactone 50 MG tablet Commonly known as: ALDACTONE Take 100 mg by mouth daily.               Discharge Care Instructions  (From admission, onward)           Start     Ordered   07/05/22 0000  Discharge wound care:       Comments: Negative pressure wound care at incision   07/05/22 1352            Disposition and follow-up:   [name] was discharged from [facility] in Stable condition.  At the hospital follow up visit please address:  1.  Right midfoot infection: Patient is now status post right transtibial amputation.  She is healing well and pain is being managed with Dilaudid as needed.  Patient requires 1 week follow-up with orthopedics after discharge.  MRSA bacteremia: Patient discovered to have MRSA bacteremia and is being treated with IV daptomycin.  Repeat culture negative.  She will receive PICC line before discharge.  The plan is for her to continue with IV daptomycin during her stay at inpatient rehab.  At discharge from rehab,  she can receive a dose of oritavancin and to continue oral linezolid to complete course at 11/21.  She will need to be followed up with our Dr. Jule Ser with infectious disease.  Without leukocytosis a day of discharge but please continue to follow with CBC.  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  T. bili is improving.  Her MELD Na is 26, 19.6% 3 month mortality, Child Pugh Class C.  She appears clinically stable.  We will continue on nadolol, Lasix, spironolactone, lactulose 20 g twice daily.  Please ensure she is having  an appropriate amount of bowel movements and that her abdominal distention is not causing her too much discomfort.  2.  Labs / imaging needed at time of follow-up: CBC  3.  Pending labs/ test needing follow-up: None  Follow-up Appointments:  Follow-up Information     Newt Minion, MD Follow up in 1 week(s).   Specialty: Orthopedic Surgery Contact information: [place] [address] [phone]         Mignon Pine, DO Follow up.   Specialties: Infectious Diseases, Internal Medicine Why: 07/29/22 at 2:30 pm. Please call to reschedule if you are not able to make this appointment. Contact information: [place] [address] [phone]                 Hospital Course by problem list: 1.  Right midfoot infection: Patient presented with right foot pain.  She was afebrile, tachycardic.  WBC 19.  Lactate 2.1.  Blood cultures collected.  Imaging negative for fractures.  Ultrasound without DVT.  Code sepsis was activated.  Patient was started on ceftriaxone and vancomycin for cellulitis.  Dilaudid, Percocet, and morphine for pain.  Frank purulence on exam.  MRI with extensive osteomyelitis, tenosynovitis, myositis, and cellulitis.  Patient immunosuppressed due to chronic liver disease.  Orthopedic surgery consulted.  Received IV fluids for sepsis.  Hospital day 2 midfoot worse with increased purulence and sloughing of skin.  Sepsis resolved.  Patient eventually went for right transtibial amputation.  Systemic signs of infection much improved.  Patient continues with burning pain that she repeatedly reports at right foot, though amputated.  Transition to oral Dilaudid with IV Dilaudid only for breakthrough by day of discharge.  Additionally found to have MRSA bacteremia.  Treated with IV daptomycin.  TTE without endocarditis.  Held off on TEE given esophageal varices.  PICC line to be placed for continued administration of daptomycin  through her completion of inpatient rehab.  Patient to receive dose of oritavancin day of discharge from rehab and then continue linezolid oral through 11/21.  Patient to follow-up with Dr. Juleen China and infectious disease.  Discharge Exam:   BP 118/81 (BP Location: Right Arm)   Pulse 76   Temp 98 F (36.7 C) (Oral)   Resp 16   Ht '4\' 11"'$  (1.499 m)   Wt 67 kg   LMP  (LMP Unknown) Comment: MENAPAUSE AT 51 YEARS OLD  SpO2 96%   BMI 29.83 kg/m  Discharge exam:  Physical Exam Constitutional:      General: She is not in acute distress. Eyes:     Extraocular Movements: Extraocular movements intact.  Pulmonary:     Effort: Pulmonary effort is normal.  Musculoskeletal:     Comments: 1-2+ pitting edema at left lowe extremity.  Skin:    General: Skin is warm and dry.  Coloration: Skin is jaundiced.  Neurological:     General: No focal deficit present.     Mental Status: She is alert and oriented to person, place, and time.      Pertinent Labs, Studies, and Procedures:  [place] EKG SITE RITE  Result Date: 07/05/2022 If Site Rite image not attached, placement could not be confirmed due to current cardiac rhythm.  ECHOCARDIOGRAM COMPLETE  Result Date: 07/01/2022    ECHOCARDIOGRAM REPORT   Patient Name:   EMYA PICADO Date of Exam: 07/01/2022 Medical Rec #:  [id number]         Height:       59.0 in Accession #:    [id number]        Weight:       149.9 lb Date of Birth:  01-Apr-1971         BSA:          55.632 m Patient Age:    29 years          BP:           124/68 mmHg Patient Gender: F                 HR:           123 bpm. Exam Location:  Inpatient Procedure: 2D Echo, Cardiac Doppler and Color Doppler Indications:     Bacteremia  History:         Patient has prior history of Echocardiogram examinations, most                  recent 07/25/2021. Arrythmias:Tachycardia. Hepatic cirrhosis,                  ETOH, Hepatitis C, ascites.  Sonographer:     Eartha Inch Referring Phys:  [id number]  CORNELIUS N VAN DAM Diagnosing Phys: Franki Monte  Sonographer Comments: Image acquisition challenging due to patient body habitus and Image acquisition challenging due to respiratory motion. IMPRESSIONS  1. Left ventricular ejection fraction, by estimation, is 65 to 70%. The left ventricle has normal function. The left ventricle has no regional wall motion abnormalities. Left ventricular diastolic parameters are consistent with Grade I diastolic dysfunction (impaired relaxation).  2. Right ventricular systolic function is normal. The right ventricular size is normal. There is normal pulmonary artery systolic pressure. The estimated right ventricular systolic pressure is 46.5 mmHg.  3. The mitral valve is normal in structure. No evidence of mitral valve regurgitation. No evidence of mitral stenosis.  4. The aortic valve is tricuspid. Aortic valve regurgitation is not visualized. No aortic stenosis is present.  5. The inferior vena cava is normal in size with greater than 50% respiratory variability, suggesting right atrial pressure of 3 mmHg.  6. No vegetation noted. FINDINGS  Left Ventricle: Left ventricular ejection fraction, by estimation, is 65 to 70%. The left ventricle has normal function. The left ventricle has no regional wall motion abnormalities. The left ventricular internal cavity size was normal in size. There is  no left ventricular hypertrophy. Left ventricular diastolic parameters are consistent with Grade I diastolic dysfunction (impaired relaxation). Right Ventricle: The right ventricular size is normal. No increase in right ventricular wall thickness. Right ventricular systolic function is normal. There is normal pulmonary artery systolic pressure. The tricuspid regurgitant velocity is 2.65 m/s, and  with an assumed right atrial pressure of 3 mmHg, the estimated right ventricular systolic pressure is 68.1 mmHg. Left Atrium: Left atrial size was normal in size.  Right Atrium: Right atrial size  was normal in size. Pericardium: There is no evidence of pericardial effusion. Mitral Valve: The mitral valve is normal in structure. No evidence of mitral valve regurgitation. No evidence of mitral valve stenosis. Tricuspid Valve: The tricuspid valve is normal in structure. Tricuspid valve regurgitation is trivial. Aortic Valve: The aortic valve is tricuspid. Aortic valve regurgitation is not visualized. No aortic stenosis is present. Pulmonic Valve: The pulmonic valve was normal in structure. Pulmonic valve regurgitation is trivial. Aorta: The aortic root is normal in size and structure. Venous: The inferior vena cava is normal in size with greater than 50% respiratory variability, suggesting right atrial pressure of 3 mmHg. IAS/Shunts: No atrial level shunt detected by color flow Doppler.  LEFT VENTRICLE PLAX 2D LVIDd:         4.10 cm   Diastology LVIDs:         2.30 cm   LV e' medial:  6.20 cm/s LV PW:         0.90 cm   LV e' lateral: 10.80 cm/s LV IVS:        0.80 cm LVOT diam:     2.10 cm LV SV:         76 LV SV Index:   47 LVOT Area:     3.46 cm  RIGHT VENTRICLE RV S prime:     34.70 cm/s TAPSE (M-mode): 2.0 cm LEFT ATRIUM             Index        RIGHT ATRIUM           Index LA diam:        3.00 cm 1.84 cm/m   RA Area:     12.50 cm LA Vol (A2C):   26.5 ml 16.24 ml/m  RA Volume:   26.90 ml  16.49 ml/m LA Vol (A4C):   44.9 ml 27.52 ml/m LA Biplane Vol: 35.6 ml 21.82 ml/m  AORTIC VALVE LVOT Vmax:   140.00 cm/s LVOT Vmean:  96.200 cm/s LVOT VTI:    0.220 m  AORTA Ao Root diam: 2.60 cm Ao Asc diam:  2.70 cm TRICUSPID VALVE TR Peak grad:   28.1 mmHg TR Vmax:        265.00 cm/s  SHUNTS Systemic VTI:  0.22 m Systemic Diam: 2.10 cm Dalton McleanMD Electronically signed by Franki Monte Signature Date/Time: 07/01/2022/10:22:17 AM    Final (Updated)    MR FOOT RIGHT W WO CONTRAST  Result Date: 06/30/2022 CLINICAL DATA:  Foot swelling, nondiabetic, osteomyelitis suspected EXAM: MRI OF THE RIGHT FOREFOOT  WITHOUT AND WITH CONTRAST TECHNIQUE: Multiplanar, multisequence MR imaging of the right forefoot was performed before and after the administration of intravenous contrast. CONTRAST:  6.23m GADAVIST GADOBUTROL 1 MMOL/ML IV SOLN COMPARISON:  X-ray 06/29/2022 FINDINGS: Bones/Joint/Cartilage Intense bone marrow edema with enhancement involving multiple bones within the midfoot including the cuboid, medial cuneiform, intermediate cuneiform, lateral cuneiform as well as the bases of the second through fifth metatarsals. Appearance is most compatible with acute osteomyelitis. There are small effusions throughout the midfoot and across the TMT joints concerning for septic arthritis. Nondisplaced fractures at these locations are not excluded. Mild marrow edema within the navicular. Susceptibility artifact from hardware within the first and fifth metatarsals. No dislocations. Ligaments Lisfranc ligament is thickened and heterogeneous, which could reflect sequela of prior trauma or reactive changes from local inflammation. Intact collateral ligaments. Muscles and Tendons Large volume tenosynovitis involving the extensor hallucis longus tendon  and extensor digitorum tendons within the forefoot and midfoot. These tenosynovial fluid collections appear contiguous with adjacent rim enhancing abscesses within the soft tissues of the midfoot and proximal forefoot. Focal dorsal midfoot collection predominantly involving the extensor digitorum tendons measures approximately 4.0 x 1.7 x 4.0 cm (series 4, image 10). More medially located dorsal midfoot collection measures approximately 4.0 x 2.0 x 2.5 cm (series 4, image 4). More laterally located collection within the midfoot measures approximately 2.7 x 1.3 x 2.7 cm (series 9, image 5). Focal tenosynovitis at the knot of Henry within the plantar midfoot. Diffuse edema-like signal throughout the foot musculature. Soft tissues Marked soft tissue swelling of the forefoot and midfoot.  Wound or ulceration at the dorso lateral aspect of the midfoot. IMPRESSION: 1. Findings are most compatible with extensive osteomyelitis throughout the bones of the midfoot and the bases of the second through fifth metatarsals. Small joint effusions throughout the midfoot and within the TMT joints is concerning for septic arthritis. 2. Large volume tenosynovitis involving the extensor hallucis longus tendon and extensor digitorum tendons within the forefoot and midfoot. These tenosynovial fluid collections appear contiguous with multiple adjacent rim enhancing abscesses within the soft tissues of the midfoot and proximal forefoot, as described. 3. Findings of diffuse cellulitis and myositis of the right foot. Electronically Signed   By: Davina Poke D.O.   On: 06/30/2022 09:39   VAS [place] LOWER EXTREMITY VENOUS (DVT) (ONLY [facility] & [facility])  Result Date: 06/30/2022  Lower Venous DVT Study Patient Name:  GISELLA ALWINE  Date of Exam:   06/29/2022 Medical Rec #: [id number]          Accession #:    [id number] Date of Birth: 10-Nov-1970          Patient Gender: F Patient Age:   63 years Exam Location:  [facility] [place] Procedure:      VAS [place] LOWER EXTREMITY VENOUS (DVT) Referring Phys: Herbie Baltimore PATERSON --------------------------------------------------------------------------------  Indications: Right foot edema, pain, erythema after dropping a crock pot on foot 1 month ago.  Comparison Study: No prior study on file Performing Technologist: Sharion Dove RVS  Examination Guidelines: A complete evaluation includes B-mode imaging, spectral Doppler, color Doppler, and power Doppler as needed of all accessible portions of each vessel. Bilateral testing is considered an integral part of a complete examination. Limited examinations for reoccurring indications may be performed as noted. The reflux portion of the exam is performed with the patient in reverse Trendelenburg.   +---------+---------------+---------+-----------+----------+--------------+ RIGHT    CompressibilityPhasicitySpontaneityPropertiesThrombus Aging +---------+---------------+---------+-----------+----------+--------------+ CFV      Full           Yes      Yes                                 +---------+---------------+---------+-----------+----------+--------------+ SFJ      Full                                                        +---------+---------------+---------+-----------+----------+--------------+ FV Prox  Full                                                        +---------+---------------+---------+-----------+----------+--------------+  FV Mid   Full                                                        +---------+---------------+---------+-----------+----------+--------------+ FV DistalFull                                                        +---------+---------------+---------+-----------+----------+--------------+ PFV      Full                                                        +---------+---------------+---------+-----------+----------+--------------+ POP      Full           Yes      Yes                                 +---------+---------------+---------+-----------+----------+--------------+ PTV      Full                                                        +---------+---------------+---------+-----------+----------+--------------+ PERO     Full                                                        +---------+---------------+---------+-----------+----------+--------------+ GSV      Full                                                        +---------+---------------+---------+-----------+----------+--------------+   +----+---------------+---------+-----------+----------+--------------+ LEFTCompressibilityPhasicitySpontaneityPropertiesThrombus Aging  +----+---------------+---------+-----------+----------+--------------+ CFV Full           Yes      Yes                                 +----+---------------+---------+-----------+----------+--------------+     Summary: RIGHT: - There is no evidence of deep vein thrombosis in the lower extremity.  - No cystic structure found in the popliteal fossa. - Ultrasound characteristics of enlarged, vascularized lymph nodes are noted in the groin.  LEFT: - No evidence of common femoral vein obstruction.  *See table(s) above for measurements and observations. Electronically signed by Servando Snare MD on 06/30/2022 at 9:10:24 AM.    Final    DG Foot Complete Right  Result Date: 06/29/2022 CLINICAL DATA:  Dropped heavy object on foot 1 month ago. Persistent foot pain. EXAM: RIGHT FOOT COMPLETE - 3+ VIEW COMPARISON:  12/01/2019 FINDINGS: There is no  evidence of acute fracture or dislocation. Old fracture deformities with internal fixation screws are again seen involving the 1st and 5th metatarsals. No other osseous abnormality identified. Prominent dorsal midfoot soft tissue swelling is noted. IMPRESSION: Prominent dorsal midfoot soft tissue swelling. No evidence of acute fracture or dislocation. Electronically Signed   By: Marlaine Hind M.D.   On: 06/29/2022 17:05   US LIVER DOPPLER  Result Date: 05/15/2022 CLINICAL DATA:  Cirrhosis, portal hypertension, ascites and right upper quadrant pain. EXAM: DUPLEX ULTRASOUND OF LIVER TECHNIQUE: Color and duplex Doppler ultrasound was performed to evaluate the hepatic in-flow and out-flow vessels. COMPARISON:  Multiple prior CT studies including a CT of the abdomen and pelvis without contrast on 05/13/2022 FINDINGS: Portal Vein Velocities Main: The main portal vein is occluded. Right: Confident flow not detected. There may be some collateral flow present. There was an area sampled that also includes artifact from likely adjacent sampled hepatic artery. Left: Similar findings  in the right lobe of probable collateral flow and/or artifact from adjacent hepatic arterial flow. Hepatic Vein Velocities Right:  113 cm/sec Middle:  39 cm/sec Left:  55 cm/sec Hepatic Artery Velocity:  157 cm/sec Splenic Vein: The splenic vein is patent. Varices: None visualized. Ascites: Ascites visualized adjacent to the liver. The liver demonstrates morphology consistent with advanced cirrhosis. The spleen is enlarged with estimated volume of approximately 530 mL. IMPRESSION: 1. 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 2. Advance cirrhosis of the liver with associated ascites and splenomegaly. Electronically Signed   By: Aletta Edouard M.D.   On: 05/15/2022 08:15   US Paracentesis  Result Date: 05/14/2022 INDICATION: Patient with a history of cirrhosis and recurrent ascites presents today for a therapeutic and diagnostic paracentesis. EXAM: ULTRASOUND GUIDED PARACENTESIS MEDICATIONS: 1% lidocaine 15 mL COMPLICATIONS: None immediate. PROCEDURE: Informed written consent was obtained from the patient after a discussion of the risks, benefits and alternatives to treatment. A timeout was performed prior to the initiation of the procedure. Initial ultrasound scanning demonstrates a large amount of ascites within the left lower abdominal quadrant. The left lower abdomen was prepped and draped in the usual sterile fashion. 1% lidocaine was used for local anesthesia. Following this, a 19 gauge, 7-cm, Yueh catheter was introduced. An ultrasound image was saved for documentation purposes. The paracentesis was performed. The catheter was removed and a dressing was applied. The patient tolerated the procedure well without immediate post procedural complication. FINDINGS: A total of approximately 1.6 L of clear yellow fluid was removed. Samples were sent to the laboratory as requested by the clinical team. IMPRESSION: Successful ultrasound-guided paracentesis yielding  1.6 liters of peritoneal fluid. Read by: Soyla Dryer, NP PLAN: The patient has previously been formally evaluated by the [facility] and is being actively followed for potential future intervention. ** Patient is now following with [facility] according to recent notes in Care everywhere. Electronically Signed   By: Ruthann Cancer M.D.   On: 05/14/2022 15:34   CT Abdomen Pelvis Wo Contrast  Result Date: 05/13/2022 CLINICAL DATA:  Abdominal pain, acute, nonlocalized EXAM: CT ABDOMEN AND PELVIS WITHOUT CONTRAST TECHNIQUE: Multidetector CT imaging of the abdomen and pelvis was performed following the standard protocol without IV contrast. RADIATION DOSE REDUCTION: This exam was performed according to the departmental dose-optimization program which includes automated exposure control, adjustment of the mA and/or kV according to patient size and/or use of iterative reconstruction technique. COMPARISON:  03/30/2022 FINDINGS: Lower  chest: The visualized lung bases are clear. Cardiac size within normal limits. No pericardial effusion. Numerous gastroesophageal varices are seen surrounding the distal esophagus. Hepatobiliary: The liver is shrunken and the contour is nodular in keeping with changes of advanced cirrhosis. No definite intrahepatic mass identified on this noncontrast examination. Cholelithiasis again noted within a decompressed gallbladder. No intra or extrahepatic biliary ductal dilation. Pancreas: Numerous calcifications are seen within the pancreatic head, stable since prior examination and in keeping with changes of chronic pancreatitis. There is progressive peripancreatic edema involving the body of the pancreas which may represent ascites within the lesser sac or inflammatory changes related to acute pancreatitis. The pancreatic duct is not dilated. Spleen: Normal in size without focal abnormality. Adrenals/Urinary Tract: Adrenal glands are unremarkable.  Kidneys are normal, without renal calculi, focal lesion, or hydronephrosis. Bladder is unremarkable. Stomach/Bowel: There is mild circumferential bowel wall thickening and pericolonic inflammatory stranding involving the distal descending colon which appears similar to prior examination and may represent a small residual region of infectious or inflammatory colitis. Previously noted bowel wall thickening involving the ascending and transverse colon has improved in the interval. No evidence of obstruction. The stomach, small bowel, and large bowel are otherwise unremarkable. Appendix normal. There is moderate ascites present, stable since prior examination. Vascular/Lymphatic: The gonadal veins are dilated bilaterally, not well assessed on this noncontrast examination. The abdominal vasculature is otherwise unremarkable. No aortic aneurysm. No pathologic adenopathy within the abdomen and pelvis. Reproductive: Uterus and bilateral adnexa are unremarkable. Other: No abdominal wall hernia. Musculoskeletal: No acute bone abnormality. No lytic or blastic bone lesion. IMPRESSION: 1. Morphologic changes in keeping with advanced cirrhosis and portal venous hypertension with numerous gastroesophageal varices and moderate ascites. 2. Cholelithiasis. 3. Progressive peripancreatic edema involving the body of the pancreas which may represent ascites within the lesser sac or inflammatory changes related to acute pancreatitis. Correlation with serum amylase and lipase would be helpful for further evaluation. 4. Interval improvement in bowel wall thickening involving the ascending and transverse colon. Mild residual bowel wall thickening and pericolonic inflammatory stranding involving the distal descending colon may represent a small residual region of infectious or inflammatory colitis. Electronically Signed   By: Fidela Salisbury M.D.   On: 05/13/2022 21:18   DG Chest 2 View  Result Date: 05/13/2022 CLINICAL DATA:  Shortness of  breath EXAM: CHEST - 2 VIEW COMPARISON:  Previous studies including examination done on 03/11/2022 FINDINGS: Cardiac size is within normal limits. There are calcified nodes in left hilum. There is poor inspiration. There are no signs of pulmonary edema or focal pulmonary consolidation. Postsurgical changes are noted in right shoulder. IMPRESSION: There are no signs of pulmonary edema or focal pulmonary consolidation. Electronically Signed   By: Elmer Picker M.D.   On: 05/13/2022 20:44     Discharge Instructions: Discharge Instructions     Diet - low sodium heart healthy   Complete by: As directed    Discharge instructions   Complete by: As directed    Mrs. Hapke,  It was a pleasure caring for you during your stay here at [facility].  You came in with an infection of your right foot and this was treated with amputation.  You are healing well and regaining your strength.  You were also found to have a blood infection.  You are being treated with IV antibiotics and will continue this during your stay in inpatient rehab.  After you leave the inpatient rehab, you can begin to take oral  antibiotics until November 21. -Please schedule follow-up with your primary care provider within 1 to 2 weeks after discharge -Your orthopedic surgeon will call you to schedule 1 week follow-up   Discharge wound care:   Complete by: As directed    Negative pressure wound care at incision   Increase activity slowly   Complete by: As directed    Negative Pressure Wound Therapy - Incisional   Complete by: As directed        Signed: Linward Natal, MD 07/05/2022, 2:12 PM   Pager: [phone]

## 2022-07-05 NOTE — Progress Notes (Signed)
PMR Admission Coordinator Pre-Admission Assessment   Patient: Briana Sweeney is an 51 y.o., female MRN: [id number] DOB: 05-10-1971 Height: _0  (149.9 cm) Weight: 67 kg   Insurance Information HMO:     PPO:      PCP:      IPA:      80/20:      OTHER:  PRIMARY: Medicaid [place] access      Policy#: [id number] p      Subscriber: pt Benefits:  Phone #: passport one source online     Name: 10/26 Eff. Date: active 10/26  MADCY   Deduct:       Out of Pocket Max:       Life Max:  CIR: per medicaid      SNF: per medicaid Outpatient: per medicaid     Co-Pay:  Home Health: per medicaid      Co-Pay:  DME: per medicaid     Co-Pay:  Providers: in network  SECONDARY: none         Financial Counselor:       Phone#:    [employer] Engineer, [employer]" for patients in Inpatient Rehabilitation Facilities with attached "Privacy Act [place] Records" was provided and verbally reviewed with: N/A   Emergency Contact Information Contact Information       Name Relation Home Work Mobile    hill,louise Friend     [phone]    Rale,[name] Sister     [phone]         Current Medical History  Patient Admitting Diagnosis: R BKA   History of Present Illness: 51 year old with history of hepatic cirrhosis,hepatitis C, esophageal varices, polysubstance abuse, and seizure disorder.  Presented on 06/29/22 with progressive skin and soft tissue infection of the right foot.    MRI of the foot shows extensive osteomyelitis throughout the bones of the midfoot, large volume tenosynovitis, extensive myositis and cellulitis.  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 Post op with wound VAC. ID consulted.  Per id recommendations to continue Daptomycin while in hospital and on CIR. ID team to be notified at time of discharge. Would then plan a dose of oritavancin at discharge followed by oral linezolid 7 days later until 11/21. Likely plan prolonged course of linezolid for MRSA  bacteremia. This was confirmed by ID Dr Juleen China.   Postoperative pain control and follow up with her anemia. Received monthly paracentesis and likely to require small volume paracentesis while hospitalized. Continue home nadolol, lasix and spironolactone. Keppra for seizure disorder. Monitor renal function. Home anxiety meds with Klonopin.   Patient's medical record from [facility] has been reviewed by the rehabilitation admission coordinator and physician.   Past Medical History      Past Medical History:  Diagnosis Date   Abscess of bursa of left elbow 09/23/2018   Acute hyperactive alcohol withdrawal delirium ([place]) 10/09/2017   Acute low back pain with bilateral sciatica     Acute metabolic encephalopathy 92/42/6834   Acute on chronic pancreatitis ([place]) 04/04/2021   AKI (acute kidney injury) ([place]) 04/04/2021   Alcohol withdrawal syndrome with complication ([place]) 19/62/2297   Alcohol-induced acute pancreatitis     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[place]) 12/05/2018   Ascites due to alcoholic cirrhosis ([place])     Bipolar affective disorder ([place])      With anxiety features   Bunionette of right foot 11/2019   Cirrhosis of liver (HCC)      Due to alcohol and hepatitis C  Cocaine dependence without complication ([place]) 76/73/4193   Decompensated hepatic cirrhosis ([place]) 07/23/2017   Depression with anxiety     Epistaxis 01/20/2019   Erysipelas 09/13/2018   Esophageal 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 ([place])     Esophageal varices without bleeding (HCC)     ETOHism (HCC)     GERD (gastroesophageal reflux disease)     Head injury     Hematemesis 02/10/2018   Hepatitis C 2018    hepatitis c and alcohol related hepatitis   Heroin abuse ([place])     History of blood transfusion      "blood doesn't clot; I fell down and had to have a transfusion"   History of kidney stones     Menopause 2016   Migraine      "when I get really stressed" (09/01/2017)   Neuropathy 10/27/2018   Olecranon bursitis of left  elbow     Other mixed anxiety disorders 10/27/2018   Overdose 08/22/2018   Pancytopenia ([place]) 07/23/2017   Polysubstance abuse ([place]) 04/08/2018   Portal hypertension ([place]) 03/18/2018   S/P foot surgery, right 11/2019   Schizophrenia ([place])     Seizures ([place])      "when I run out of my RX; lots recently" (09/01/2017)   Suicidal ideation     Thrombocytopenia ([place])     Trichimoniasis 10/30/2017   Upper GI bleed 02/10/2018   Urinary urgency 05/2020   UTI (urinary tract infection) 06/02/2017    Has the patient had major surgery during 100 days prior to admission? Yes   Family History   family history includes Alcohol abuse in her mother; Lung cancer (age of onset: 78) in her mother; Throat cancer (age of onset: 2) in her father.   Current Medications   Current Facility-Administered Medications:    acetaminophen (TYLENOL) tablet 500 mg, 500 mg, Oral, Q6H PRN, Newt Minion, MD, 500 mg at 07/01/22 2217   alum & mag hydroxide-simeth (MAALOX/MYLANTA) 200-200-20 MG/5ML suspension 15-30 mL, 15-30 mL, Oral, Q2H PRN, Newt Minion, MD, 30 mL at 07/04/22 0349   ARIPiprazole (ABILIFY) tablet 10 mg, 10 mg, Oral, Daily, Linward Natal, MD, 10 mg at 07/05/22 1022   ascorbic acid (VITAMIN C) tablet 1,000 mg, 1,000 mg, Oral, Daily, Newt Minion, MD, 1,000 mg at 07/05/22 1021   bisacodyl (DULCOLAX) EC tablet 5 mg, 5 mg, Oral, Daily PRN, Newt Minion, MD   clonazePAM Bobbye [place]) tablet 0.5 mg, 0.5 mg, Oral, BID PRN, Newt Minion, MD, 0.5 mg at 07/05/22 0113   DAPTOmycin (CUBICIN) 500 mg in sodium chloride 0.9 % IVPB, 500 mg, Intravenous, Q2000, [place], DO, Last Rate: 120 mL/hr at 07/04/22 2104, 500 mg at 07/04/22 2104   docusate sodium (COLACE) capsule 100 mg, 100 mg, Oral, Daily, Newt Minion, MD, 100 mg at 07/03/22 0915   enoxaparin (LOVENOX) injection 40 mg, 40 mg, Subcutaneous, Q24H, Newt Minion, MD, 40 mg at 07/04/22 2105   feeding supplement (ENSURE ENLIVE / ENSURE PLUS)  liquid 237 mL, 237 mL, Oral, BID BM, Newt Minion, MD, 237 mL at 07/05/22 1022   fiber supplement (BANATROL TF) liquid 60 mL, 60 mL, Oral, BID, Newt Minion, MD, 60 mL at 07/05/22 1019   FLUoxetine (PROZAC) capsule 40 mg, 40 mg, Oral, Daily, Newt Minion, MD, 40 mg at 79/02/40 9735   folic acid (FOLVITE) tablet 1 mg, 1 mg, Oral, Daily, Newt Minion, MD, 1 mg at 07/05/22 1020   furosemide (LASIX) tablet  40 mg, 40 mg, Oral, Daily, Linward Natal, MD, 40 mg at 07/05/22 1021   guaiFENesin-dextromethorphan (ROBITUSSIN DM) 100-10 MG/5ML syrup 15 mL, 15 mL, Oral, Q4H PRN, Newt Minion, MD   hydrALAZINE (APRESOLINE) injection 5 mg, 5 mg, Intravenous, Q20 Min PRN, Newt Minion, MD   HYDROmorphone (DILAUDID) injection 0.5-1 mg, 0.5-1 mg, Intravenous, Q4H PRN, Linward Natal, MD   HYDROmorphone (DILAUDID) tablet 4 mg, 4 mg, Oral, Q4H PRN, Newt Minion, MD, 4 mg at 07/05/22 1020   lactulose (CHRONULAC) 10 GM/15ML solution 20 g, 20 g, Oral, BID, Farrel Gordon, DO   levETIRAcetam (KEPPRA) tablet 750 mg, 750 mg, Oral, BID, Newt Minion, MD, 750 mg at 07/05/22 1021   magnesium citrate solution 1 Bottle, 1 Bottle, Oral, Once PRN, Newt Minion, MD   magnesium sulfate IVPB 2 g 50 mL, 2 g, Intravenous, Daily PRN, Newt Minion, MD   multivitamin with minerals tablet 1 tablet, 1 tablet, Oral, Daily, Newt Minion, MD, 1 tablet at 07/05/22 1022   nadolol (CORGARD) tablet 20 mg, 20 mg, Oral, q morning, Linward Natal, MD, 20 mg at 07/05/22 1020   nutrition supplement (JUVEN) (JUVEN) powder packet 1 packet, 1 packet, Oral, BID BM, Newt Minion, MD, 1 packet at 07/05/22 1019   ondansetron (ZOFRAN) injection 4 mg, 4 mg, Intravenous, Q4H PRN, Newt Minion, MD, 4 mg at 06/30/22 1719   Oral care mouth rinse, 15 mL, Mouth Rinse, PRN, Newt Minion, MD   pantoprazole (PROTONIX) EC tablet 40 mg, 40 mg, Oral, Daily, Newt Minion, MD, 40 mg at 07/05/22 1020   phenol (CHLORASEPTIC) mouth spray 1 spray, 1  spray, Mouth/Throat, PRN, Newt Minion, MD   polyethylene glycol (MIRALAX / GLYCOLAX) packet 17 g, 17 g, Oral, Daily PRN, Newt Minion, MD   spironolactone (ALDACTONE) tablet 100 mg, 100 mg, Oral, Daily, Linward Natal, MD, 100 mg at 07/05/22 1021   thiamine (VITAMIN B1) tablet 100 mg, 100 mg, Oral, Daily, Newt Minion, MD, 100 mg at 07/05/22 1021   zinc sulfate capsule 220 mg, 220 mg, Oral, Daily, Newt Minion, MD, 220 mg at 07/05/22 1021   Facility-Administered Medications Ordered in Other Encounters:    acetaminophen (TYLENOL) 325 MG tablet, , , ,    diphenhydrAMINE (BENADRYL) 25 mg capsule, , , ,    Patients Current Diet:  Diet Order                  Diet Carb Modified Fluid consistency: Thin; Room service appropriate? Yes  Diet effective now                       Precautions / Restrictions Precautions Precautions: Fall Precaution Comments: Reviewed verbally Restrictions Weight Bearing Restrictions: Yes RLE Weight Bearing: Non weight bearing    Has the patient had 2 or more falls or a fall with injury in the past year? Yes   Prior Activity Level Limited Community (1-2x/wk): Independent   Prior Functional Level Self Care: Did the patient need help bathing, dressing, using the toilet or eating? Independent   Indoor Mobility: Did the patient need assistance with walking from room to room (with or without device)? Independent   Stairs: Did the patient need assistance with internal or external stairs (with or without device)? Independent   Functional Cognition: Did the patient need help planning regular tasks such as shopping or remembering to take medications? Independent   Patient Information  Are you of Hispanic, Latino/a,or Spanish origin?: A. No, not of Hispanic, Latino/a, or Spanish origin What is your race?: A. White Do you need or want an interpreter to communicate with a doctor or health care staff?: 0. No   Patient's Response To:  Health Literacy and  Transportation Is the patient able to respond to health literacy and transportation needs?: Yes Health Literacy - How often do you need to have someone help you when you read instructions, pamphlets, or other written material from your doctor or pharmacy?: Never In the past 12 months, has lack of transportation kept you from medical appointments or from getting medications?: No In the past 12 months, has lack of transportation kept you from meetings, work, or from getting things needed for daily living?: No   Development worker, international aid / [place] Devices/Equipment: None Home Equipment: Grab bars - tub/shower, Grab bars - toilet, Wheelchair - manual   Prior Device Use: Indicate devices/aids used by the patient prior to current illness, exacerbation or injury? None of the above   Current Functional Level Cognition   Overall Cognitive Status: No family/caregiver present to determine baseline cognitive functioning Orientation Level: Oriented X4 General Comments: seems to demo slow processing and difficulty sequencing but no family to determine baseline cognition    Extremity Assessment (includes Sensation/Coordination)   Upper Extremity Assessment: Overall WFL for tasks assessed  Lower Extremity Assessment: Defer to PT evaluation RLE Deficits / Details: post op BKA  - weakness, guarding as expected RLE: Unable to fully assess due to pain     ADLs   Overall ADL's : Needs assistance/impaired Eating/Feeding: Independent, Bed level Grooming: Wash/dry hands, Wash/dry face, Min guard, Sitting Upper Body Bathing: Min guard, Sitting Lower Body Bathing: +2 for safety/equipment, Bed level Upper Body Dressing : Min guard, Sitting Lower Body Dressing: Minimal assistance, Bed level Toilet Transfer: Moderate assistance, Stand-pivot, BSC/3in1     Mobility   Overal bed mobility: Needs Assistance Bed Mobility: Rolling, Sidelying to Sit, Sit to Sidelying Rolling: Supervision Sidelying to  sit: Supervision Sit to sidelying: Supervision General bed mobility comments: Educated on bed mobility techniques including rolling with min assist, use of hand rail as needed, Mod assist for trunk and RLE support to EOB. Pt able to scoot to EOB in seated position with min assist, and slide self up in bed with min assist using bridge with LLE to push and UEs to pull HOB rail.     Transfers   Overall transfer level: Needs assistance Equipment used: Rolling walker (2 wheels) Transfers: Sit to/from Stand, Bed to chair/wheelchair/BSC Sit to Stand: Min assist Bed to/from chair/wheelchair/BSC transfer type:: Lateral/scoot transfer  Lateral/Scoot Transfers: Min assist General transfer comment: assist to boost into standing and cues for hand placement. Unable to pivot on L foot this session. Able to laterally scoot towards [facility] with minA     Ambulation / Gait / Stairs / Wheelchair Mobility         Posture / Balance Dynamic Sitting Balance Sitting balance - Comments: Able to reach forward and lateral, across chest with fair range, pulling self back to midline without assistance. Balance Overall balance assessment: Needs assistance Sitting-balance support: No upper extremity supported, Feet supported Sitting balance-Leahy Scale: Good Sitting balance - Comments: Able to reach forward and lateral, across chest with fair range, pulling self back to midline without assistance. Standing balance support: Bilateral upper extremity supported, Reliant on assistive device for balance Standing balance-Leahy Scale: Poor     Special needs/care  consideration MRSA isolation Postop WOUND VAC    Previous Home Environment  Living Arrangements: Alone  Lives With: Alone Available Help at Discharge: Neighbor, Available PRN/intermittently Type of Home:  (handicapped apartment housing) Home Layout: One level Home Access: Elevator Bathroom Shower/Tub: Chiropodist: Standard Bathroom  Accessibility: Yes How Accessible: Accessible via walker Cylinder: No   Discharge Living Setting Plans for Discharge Living Setting: Patient's home, Alone, Apartment (handicapped apartment) Type of Home at Discharge: Apartment Discharge Home Layout: One level Discharge Home Access: Elevator Discharge Bathroom Shower/Tub: Tub/shower unit Discharge Bathroom Toilet: Standard Discharge Bathroom Accessibility: Yes How Accessible: Accessible via walker Does the patient have any problems obtaining your medications?: No   Social/Family/Support Systems Anticipated Caregiver: neighbors and Environmental education officer and his wife Anticipated Ambulance person Information: see contacts Ability/Limitations of Caregiver: intermittent only Caregiver Availability: Intermittent Discharge Plan Discussed with Primary Caregiver: Yes Is Caregiver In Agreement with Plan?: Yes Does Caregiver/Family have Issues with Lodging/Transportation while Pt is in Rehab?: No Goals Patient/Family Goal for Rehab: Mod I with PT and OT at wheelchair level Expected length of stay: ELOS 7 to 10 days Pt/Family Agrees to Admission and willing to participate: Yes Program Orientation Provided & Reviewed with Pt/Caregiver Including Roles  & Responsibilities: Yes   Decrease burden of Care through IP rehab admission: n/a   Possible need for SNF placement upon discharge: not anticipated   Patient Condition: I have reviewed medical records [facility], spoken with patient. I met with patient at the bedside for inpatient rehabilitation assessment.  Patient will benefit from ongoing PT and OT, can actively participate in 3 hours of therapy a day 5 days of the week, and can make measurable gains during the admission.  Patient will also benefit from the coordinated team approach during an Inpatient Acute Rehabilitation admission.  The patient will receive intensive therapy as well as Rehabilitation physician, nursing, social  worker, and care management interventions.  Due to bladder management, bowel management, safety, skin/wound care, disease management, medication administration, pain management, and patient education the patient requires 24 hour a day rehabilitation nursing.  The patient is currently min assist  with mobility and basic ADLs.  Discharge setting and therapy post discharge at home with home health is anticipated.  Patient has agreed to participate in the Acute Inpatient Rehabilitation Program and will admit today.   Preadmission Screen Completed By:  Retta Diones, 07/05/2022 10:33 AM ______________________________________________________________________   Discussed status with Dr. Tressa Busman on 07/05/22 at 0945 and received approval for admission today.   Admission Coordinator:  Retta Diones, RN, time 1035/Date 07/05/22    Assessment/Plan: Diagnosis: Does the need for close, 24 hr/day Medical supervision in concert with the patient's rehab needs make it unreasonable for this patient to be served in a less intensive setting? Yes Co-Morbidities requiring supervision/potential complications: Pain control, wound vac management s/p BKA, liver cirrhosis, bowel/bladder incontinence, renal failure, MRSA infection. Due to bladder management, bowel management, safety, skin/wound care, disease management, medication administration, pain management, and patient education, does the patient require 24 hr/day rehab nursing? Yes Does the patient require coordinated care of a physician, rehab nurse, PT, OT, and SLP to address physical and functional deficits in the context of the above medical diagnosis(es)? Yes Addressing deficits in the following areas: balance, endurance, locomotion, strength, transferring, bowel/bladder control, bathing, dressing, feeding, grooming, toileting, and cognition Can the patient actively participate in an intensive therapy program of at least 3 hrs of therapy 5 days a  week? Yes The  potential for patient to make measurable gains while on inpatient rehab is good Anticipated functional outcomes upon discharge from inpatient rehab: modified independent PT, modified independent OT, independent SLP Estimated rehab length of stay to reach the above functional goals is: 10-14 days Anticipated discharge destination: Home 10. Overall Rehab/Functional Prognosis: good     MD Signature:   Gertie Gowda, DO 07/05/2022          Revision History                                          Note Details  Author Gertie Gowda, DO File Time 07/05/2022 12:09 PM  Author Type Physician Status Signed  Last Editor Gertie Gowda, DO Service Physical Medicine and [place] # [id number] Admit Date 07/05/2022

## 2022-07-05 NOTE — Plan of Care (Signed)
  Problem: Education: Goal: Knowledge of General Education information will improve Description: Including pain rating scale, medication(s)/side effects and non-pharmacologic comfort measures Outcome: Progressing   Problem: Health Behavior/Discharge Planning: Goal: Ability to manage health-related needs will improve Outcome: Progressing   Problem: Clinical Measurements: Goal: Ability to maintain clinical measurements within normal limits will improve Outcome: Progressing Goal: Will remain free from infection Outcome: Progressing Goal: Diagnostic test results will improve Outcome: Progressing Goal: Respiratory complications will improve Outcome: Progressing Goal: Cardiovascular complication will be avoided Outcome: Progressing   Problem: Activity: Goal: Risk for activity intolerance will decrease Outcome: Progressing   Problem: Nutrition: Goal: Adequate nutrition will be maintained Outcome: Progressing   Problem: Coping: Goal: Level of anxiety will decrease Outcome: Progressing   Problem: Elimination: Goal: Will not experience complications related to bowel motility Outcome: Progressing Goal: Will not experience complications related to urinary retention Outcome: Progressing   Problem: Pain Managment: Goal: General experience of comfort will improve Outcome: Progressing   Problem: Safety: Goal: Ability to remain free from injury will improve Outcome: Progressing   Problem: Skin Integrity: Goal: Risk for impaired skin integrity will decrease Outcome: Progressing   Problem: Education: Goal: Knowledge of the prescribed therapeutic regimen will improve Outcome: Progressing Goal: Ability to verbalize activity precautions or restrictions will improve Outcome: Progressing Goal: Understanding of discharge needs will improve Outcome: Progressing   Problem: Activity: Goal: Ability to perform//tolerate increased activity and mobilize with assistive devices will  improve Outcome: Progressing   Problem: Clinical Measurements: Goal: Postoperative complications will be avoided or minimized Outcome: Progressing   Problem: Self-Care: Goal: Ability to meet self-care needs will improve Outcome: Progressing   Problem: Self-Concept: Goal: Ability to maintain and perform role responsibilities to the fullest extent possible will improve Outcome: Progressing   Problem: Pain Management: Goal: Pain level will decrease with appropriate interventions Outcome: Progressing   Problem: Skin Integrity: Goal: Demonstration of wound healing without infection will improve Outcome: Progressing   

## 2022-07-05 NOTE — Progress Notes (Signed)
Patient admitted to unit on continued contact precaution. Patient awake and alert x4. Able to make needs known. Orientated to unit and therapy expectations. Pastor at bedside. Sanda Linger, RN

## 2022-07-05 NOTE — Progress Notes (Signed)
Inpatient Rehabilitation Admission Medication Review by a Pharmacist  A complete drug regimen review was completed for this patient to identify any potential clinically significant medication issues.  High Risk Drug Classes Is patient taking? Indication by Medication  Antipsychotic Yes Abilify - hx bipolar dz Compazine PO/PR/IM - nausea  Anticoagulant Yes Enoxaparin - VTE ppx  Antibiotic Yes, as an intravenous medication Daptomycin - osteomyelitis, MRSA bacteremia (EOT 07/30/22)  Opioid Yes Dilaudid PO - moderate pain  Antiplatelet No   Hypoglycemics/insulin No   Vasoactive Medication Yes Lasix, spironolactone, nadolol - cirrhosis/varices  Chemotherapy No   Other Yes Vit C, folic acid, thiamine, zinc,  Prozac - hx bipolar dz Clonazepam - hx anxiety Lactulose - cirrhosis/varices Keppra - hx seizure dz Robaxin - muscle relaxant Trazodone - sleep Benadryl- itching     Type of Medication Issue Identified Description of Issue Recommendation(s)  Drug Interaction(s) (clinically significant)     Duplicate Therapy     Allergy     No Medication Administration End Date     Incorrect Dose     Additional Drug Therapy Needed     Significant med changes from prior encounter (inform family/care partners about these prior to discharge).    Other       Clinically significant medication issues were identified that warrant physician communication and completion of prescribed/recommended actions by midnight of the next day:  No  Name of provider notified for urgent issues identified:   Provider Method of Notification:     Pharmacist comments:   Time spent performing this drug regimen review (minutes):  [place], PharmD, BCPS 07/05/2022 2:31 PM

## 2022-07-05 NOTE — Progress Notes (Signed)
Peripherally Inserted Central Catheter Placement  The IV Nurse has discussed with the patient and/or persons authorized to consent for the patient, the purpose of this procedure and the potential benefits and risks involved with this procedure.  The benefits include less needle sticks, lab draws from the catheter, and the patient may be discharged home with the catheter. Risks include, but not limited to, infection, bleeding, blood clot (thrombus formation), and puncture of an artery; nerve damage and irregular heartbeat and possibility to perform a PICC exchange if needed/ordered by physician.  Alternatives to this procedure were also discussed.  Bard Power PICC patient education guide, fact sheet on infection prevention and patient information card has been provided to patient /or left at bedside. Pt alert and oriented x 4 to sign consent.  PICC Placement Documentation  PICC Single Lumen 90/21/11 Right Basilic 32 cm 1 cm (Active)  Indication for Insertion or Continuance of Line Prolonged intravenous therapies 07/05/22 1600  Exposed Catheter (cm) 1 cm 07/05/22 1600  Site Assessment Clean, Dry, Intact 07/05/22 1600  Line Status Flushed;Saline locked;Blood return noted 07/05/22 1600  Dressing Type Transparent;Securing device 07/05/22 1600  Dressing Status Antimicrobial disc in place;Clean, Dry, Intact 07/05/22 1600  Safety Lock Not Applicable 55/20/80 2233  Line Care Connections checked and tightened 07/05/22 1600  Line Adjustment (NICU/IV Team Only) No 07/05/22 1600  Dressing Intervention New dressing 07/05/22 1600  Dressing Change Due 07/12/22 07/05/22 1600       [place] 07/05/2022, 4:44 PM

## 2022-07-05 NOTE — Progress Notes (Signed)
IP rehab admissions - Attending MD has cleared patient for acute inpatient rehab admission. Bed available on CIR today and will admit today.  Call for questions.  # [phone]

## 2022-07-06 ENCOUNTER — Inpatient Hospital Stay (HOSPITAL_COMMUNITY): Payer: No Typology Code available for payment source

## 2022-07-06 DIAGNOSIS — R52 Pain, unspecified: Secondary | ICD-10-CM

## 2022-07-06 DIAGNOSIS — R609 Edema, unspecified: Secondary | ICD-10-CM

## 2022-07-06 DIAGNOSIS — Z89511 Acquired absence of right leg below knee: Secondary | ICD-10-CM | POA: Diagnosis not present

## 2022-07-06 LAB — PHOSPHORUS: Phosphorus: 3.9 mg/dL (ref 2.5–4.6)

## 2022-07-06 LAB — COMPREHENSIVE METABOLIC PANEL
ALT: 50 U/L — ABNORMAL HIGH (ref 0–44)
AST: 68 U/L — ABNORMAL HIGH (ref 15–41)
Albumin: 1.8 g/dL — ABNORMAL LOW (ref 3.5–5.0)
Alkaline Phosphatase: 316 U/L — ABNORMAL HIGH (ref 38–126)
Anion gap: 9 (ref 5–15)
BUN: 13 mg/dL (ref 6–20)
CO2: 29 mmol/L (ref 22–32)
Calcium: 8.3 mg/dL — ABNORMAL LOW (ref 8.9–10.3)
Chloride: 94 mmol/L — ABNORMAL LOW (ref 98–111)
Creatinine, Ser: 0.55 mg/dL (ref 0.44–1.00)
GFR, Estimated: >60 mL/min (ref >60)
Glucose, Bld: 104 mg/dL — ABNORMAL HIGH (ref 70–99)
Potassium: 3.9 mmol/L (ref 3.5–5.1)
Sodium: 132 mmol/L — ABNORMAL LOW (ref 135–145)
Total Bilirubin: 3.4 mg/dL — ABNORMAL HIGH (ref 0.3–1.2)
Total Protein: 5.2 g/dL — ABNORMAL LOW (ref 6.5–8.1)

## 2022-07-06 LAB — CBC WITH DIFFERENTIAL/PLATELET
Abs Immature Granulocytes: 0.4 10*3/uL — ABNORMAL HIGH (ref 0.00–0.07)
Basophils Absolute: 0.1 10*3/uL (ref 0.0–0.1)
Basophils Relative: 1 %
Eosinophils Absolute: 0.2 10*3/uL (ref 0.0–0.5)
Eosinophils Relative: 2 %
HCT: 35 % — ABNORMAL LOW (ref 36.0–46.0)
Hemoglobin: 11.3 g/dL — ABNORMAL LOW (ref 12.0–15.0)
Immature Granulocytes: 5 %
Lymphocytes Relative: 9 %
Lymphs Abs: 0.8 10*3/uL (ref 0.7–4.0)
MCH: 28.8 pg (ref 26.0–34.0)
MCHC: 32.3 g/dL (ref 30.0–36.0)
MCV: 89.1 fL (ref 80.0–100.0)
Monocytes Absolute: 0.7 10*3/uL (ref 0.1–1.0)
Monocytes Relative: 8 %
Neutro Abs: 6.7 10*3/uL (ref 1.7–7.7)
Neutrophils Relative %: 75 %
Platelets: 154 10*3/uL (ref 150–400)
RBC: 3.93 MIL/uL (ref 3.87–5.11)
WBC: 8.8 10*3/uL (ref 4.0–10.5)
nRBC: 0 % (ref 0.0–0.2)

## 2022-07-06 LAB — MAGNESIUM: Magnesium: 1.5 mg/dL — ABNORMAL LOW (ref 1.7–2.4)

## 2022-07-06 MED ORDER — CHLORHEXIDINE GLUCONATE CLOTH 2 % EX PADS
6.0000 | MEDICATED_PAD | Freq: Two times a day (BID) | CUTANEOUS | Status: DC
  Administered 2022-07-06 – 2022-07-22 (×32): 6 via TOPICAL

## 2022-07-06 MED ORDER — ORAL CARE MOUTH RINSE: 15.0000 mL | OROMUCOSAL | Status: DC | PRN

## 2022-07-06 NOTE — Progress Notes (Signed)
VASCULAR LAB    Left upper extremity venous duplex has been performed.  See CV proc for preliminary results.  Gave verbal results to Renda Rolls, RN  Lillyn Wieczorek, RVT 07/06/2022, 1:04 PM

## 2022-07-06 NOTE — Evaluation (Signed)
Physical Therapy Assessment and Plan  Patient Details  Name: Briana Sweeney MRN: [id number] Date of Birth: 09-11-1970  PT Diagnosis: Difficulty walking, Edema, Muscle weakness, and Pain in RLE Rehab Potential: Good ELOS: 10-14 days   Today's Date: 07/06/2022 PT Individual Time: 9244-6286 PT Individual Time Calculation (min): 47 min    Hospital Problem: Principal Problem:   Osteomyelitis of right foot ([place])   Past Medical History:  Past Medical History:  Diagnosis Date   Abscess of bursa of left elbow 09/23/2018   Acute hyperactive alcohol withdrawal delirium ([place]) 10/09/2017   Acute low back pain with bilateral sciatica    Acute metabolic encephalopathy 38/17/7116   Acute on chronic pancreatitis ([place]) 04/04/2021   AKI (acute kidney injury) ([place]) 04/04/2021   Alcohol withdrawal syndrome with complication ([place]) 57/90/3833   Alcohol-induced acute pancreatitis    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[place]) 12/05/2018   Ascites due to alcoholic cirrhosis (HCC)    Bipolar affective disorder ([place])    With anxiety features   Bunionette of right foot 11/2019   Cirrhosis of liver ([place])    Due to alcohol and hepatitis C   Cocaine dependence without complication ([place]) 38/32/9191   Decompensated hepatic cirrhosis ([place]) 07/23/2017   Depression with anxiety    Epistaxis 01/20/2019   Erysipelas 09/13/2018   Esophageal 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 (HCC)    Esophageal varices without bleeding (HCC)    ETOHism (HCC)    GERD (gastroesophageal reflux disease)    Head injury    Hematemesis 02/10/2018   Hepatitis C 2018   hepatitis c and alcohol related hepatitis   Heroin abuse ([place])    History of blood transfusion    "blood doesn't clot; I fell down and had to have a transfusion"   History of kidney stones    Menopause 2016   Migraine    "when I get really stressed" (09/01/2017)   Neuropathy 10/27/2018   Olecranon bursitis of left elbow    Other mixed anxiety disorders 10/27/2018   Overdose  08/22/2018   Pancytopenia ([place]) 07/23/2017   Polysubstance abuse ([place]) 04/08/2018   Portal hypertension ([place]) 03/18/2018   S/P foot surgery, right 11/2019   Schizophrenia ([place])    Seizures ([place])    "when I run out of my RX; lots recently" (09/01/2017)   Suicidal ideation    Thrombocytopenia ([place])    Trichimoniasis 10/30/2017   Upper GI bleed 02/10/2018   Urinary urgency 05/2020   UTI (urinary tract infection) 06/02/2017   Past Surgical History:  Past Surgical History:  Procedure Laterality Date   AMPUTATION Right 07/02/2022   Procedure: RIGHT BELOW KNEE AMPUTATION;  Surgeon: Newt Minion, MD;  Location: [facility];  Service: Orthopedics;  Laterality: Right;   APPLICATION OF WOUND VAC Right 07/02/2022   Procedure: APPLICATION OF WOUND VAC;  Surgeon: Newt Minion, MD;  Location: [facility];  Service: Orthopedics;  Laterality: Right;   BUNIONECTOMY     ESOPHAGEAL BANDING  06/26/2021   Procedure: ESOPHAGEAL BANDING;  Surgeon: Jackquline Denmark, MD;  Location: [facility];  Service: Endoscopy;;   ESOPHAGOGASTRODUODENOSCOPY N/A 09/03/2017   Procedure: ESOPHAGOGASTRODUODENOSCOPY (EGD);  Surgeon: Doran Stabler, MD;  Location: [facility];  Service: Gastroenterology;  Laterality: N/A;   ESOPHAGOGASTRODUODENOSCOPY N/A 02/01/2022   Procedure: ESOPHAGOGASTRODUODENOSCOPY (EGD);  Surgeon: Clarene Essex, MD;  Location: [facility];  Service: Gastroenterology;  Laterality: N/A;   ESOPHAGOGASTRODUODENOSCOPY (EGD) WITH PROPOFOL N/A 06/26/2021   Procedure: ESOPHAGOGASTRODUODENOSCOPY (EGD) WITH PROPOFOL;  Surgeon: Jackquline Denmark, MD;  Location[facility] ENDOSCOPY;  Service: Endoscopy;  Laterality: N/A;   FINGER FRACTURE SURGERY Left    "shattered my pinky"   FRACTURE SURGERY     I & D EXTREMITY Left 09/18/2018   Procedure: IRRIGATION AND DEBRIDEMENT EXTREMITY;  Surgeon: Leanora Cover, MD;  Location: [facility];  Service: Orthopedics;  Laterality: Left;   IR PARACENTESIS  07/23/2017   IR PARACENTESIS  07/2017   "did  it twice in the same week" (09/01/2017)   IR PARACENTESIS  06/26/2021   SHOULDER OPEN ROTATOR CUFF REPAIR Right    TOOTH EXTRACTION  08/2019   TUBAL LIGATION     VAGINAL HYSTERECTOMY      Assessment & Plan Clinical Impression: Pt is 51 y.o. female admitted 10/21 with Cellulitis and osteomyelitis of right foot, Acute renal failure superimposed on stage 3a chronic kidney disease, Severe protein-calorie malnutrition, Hepatic cirrhosis. 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  Prior to hospitalization, patient was independent  with mobility and lived with Alone in a  (disabilityelevator) home.  Home access is  Elevator.  Patient will benefit from skilled PT intervention to maximize safe functional mobility and minimize fall risk for planned discharge home alone.  Anticipate patient will benefit from follow up [place] at discharge.  PT - End of Session Activity Tolerance: Tolerates 30+ min activity with multiple rests Endurance Deficit: Yes PT Assessment Rehab Potential (ACUTE/IP ONLY): Good PT Barriers to Discharge: Lack of/limited family support;Weight bearing restrictions PT Patient demonstrates impairments in the following area(s): Balance;Endurance;Pain;Safety PT Transfers Functional Problem(s): Bed Mobility;Bed to Chair;Car PT Locomotion Functional Problem(s): Ambulation;Wheelchair Mobility PT Plan PT Intensity: Minimum of 1-2 x/day ,45 to 90 minutes PT Frequency: 5 out of 7 days PT Duration Estimated Length of Stay: 10-14 days PT Treatment/Interventions: Ambulation/gait training;Balance/vestibular training;Discharge planning;DME/adaptive equipment instruction;Functional mobility training;Pain management;Patient/family education;Skin care/wound management;Therapeutic Activities;Therapeutic Exercise;UE/LE Strength taining/ROM;UE/LE Coordination activities;Wheelchair propulsion/positioning PT Transfers  Anticipated Outcome(s): Mod I PT Locomotion Anticipated Outcome(s): Mod I PT Recommendation Follow Up Recommendations: Home health PT Patient destination: Home Equipment Recommended: To be determined   PT Evaluation Precautions/Restrictions Precautions Precautions: Fall Precaution Comments: Reviewed verbally Restrictions Weight Bearing Restrictions: Yes RLE Weight Bearing: Non weight bearing General Chart Reviewed: Yes Vital SignsTherapy Vitals Temp: 98.6 F (37 C) Pulse Rate: 86 Resp: 16 BP: 101/69 Patient Position (if appropriate): Lying Oxygen Therapy SpO2: 96 % O2 Device: Room Air Pain Pain Assessment Pain Scale: 0-10 Pain Score: 7  Pain Location: Leg Pain Orientation: Right Pain Interference Pain Interference Pain Effect on Sleep: 4. Almost constantly Pain Interference with Therapy Activities: 4. Almost constantly Pain Interference with Day-to-Day Activities: 4. Almost constantly Home Living/Prior Functioning Home Living Available Help at Discharge: Neighbor;Available PRN/intermittently Home Access: Elevator Home Layout: One level Bathroom Shower/Tub: Tub/shower unit;Curtain Armed forces training and education officer: Yes Additional Comments: maybe accessible via w/c  Lives With: Alone Prior Function Level of Independence: Independent with basic ADLs;Independent with homemaking with ambulation Vision/Perception  Vision - History Ability to See in Adequate Light: 0 Adequate  Cognition Overall Cognitive Status: Impaired/Different from baseline Arousal/Alertness: Awake/alert Orientation Level: Oriented X4 Attention: Sustained Sustained Attention: Impaired Sustained Attention Impairment: Verbal basic;Functional basic Memory: Impaired Memory Impairment: Storage deficit;Decreased short term memory Awareness: Impaired Awareness Impairment: Emergent impairment Problem Solving: Impaired Problem Solving Impairment: Functional complex;Verbal  complex;Functional basic Safety/Judgment: Appears intact Sensation Sensation Light Touch: Appears Intact Motor  Motor Motor: Within Functional Limits   Trunk/Postural Assessment     Balance Balance  Balance Assessed: Yes Dynamic Sitting Balance Dynamic Sitting - Level of Assistance: 5: Stand by assistance Static Standing Balance Static Standing - Level of Assistance: 4: Min assist Dynamic Standing Balance Dynamic Standing - Level of Assistance: 3: Mod assist Extremity Assessment      RLE Assessment RLE Assessment: Exceptions to [facility] RLE Strength RLE Overall Strength: Deficits;Other (Comment) (general 3/5) LLE Assessment LLE Assessment: Within Functional Limits  Care Tool Care Tool Bed Mobility Roll left and right activity   Roll left and right assist level: Contact Guard/Touching assist    Sit to lying activity   Sit to lying assist level: Minimal Assistance - Patient > 75%    Lying to sitting on side of bed activity   Lying to sitting on side of bed assist level: the ability to move from lying on the back to sitting on the side of the bed with no back support.: Minimal Assistance - Patient > 75%     Care Tool Transfers Sit to stand transfer   Sit to stand assist level: Moderate Assistance - Patient 50 - 74%    Chair/bed transfer   Chair/bed transfer assist level: Moderate Assistance - Patient 50 - 74%     Toilet transfer   Assist Level: Moderate Assistance - Patient 50 - 74%    Car transfer   Car transfer assist level: Moderate Assistance - Patient 50 - 74%      Care Tool Locomotion Ambulation Ambulation activity did not occur: Safety/medical concerns (2/2 wound vac on R BKA)        Walk 10 feet activity Walk 10 feet activity did not occur: Safety/medical concerns (2/2 wound vac on R BKA)       Walk 50 feet with 2 turns activity Walk 50 feet with 2 turns activity did not occur: Safety/medical concerns (2/2 wound vac on R BKA)      Walk 150 feet  activity Walk 150 feet activity did not occur: Safety/medical concerns (2/2 wound vac on R BKA)      Walk 10 feet on uneven surfaces activity Walk 10 feet on uneven surfaces activity did not occur: Safety/medical concerns (2/2 wound vac on R BKA)      Stairs Stair activity did not occur: Safety/medical concerns (2/2 wound vac on R BKA)        Walk up/down 1 step activity Walk up/down 1 step or curb (drop down) activity did not occur: Safety/medical concerns (2/2 wound vac on R BKA)      Walk up/down 4 steps activity Walk up/down 4 steps activity did not occur: Safety/medical concerns (2/2 wound vac on R BKA)      Walk up/down 12 steps activity Walk up/down 12 steps activity did not occur: Safety/medical concerns (2/2 wound vac on R BKA)      Pick up small objects from floor Pick up small object from the floor (from standing position) activity did not occur: Safety/medical concerns (2/2 wound vac on R BKA)      Wheelchair Is the patient using a wheelchair?: Yes Type of Wheelchair: Manual   Wheelchair assist level: Minimal Assistance - Patient > 75%    Wheel 50 feet with 2 turns activity      Wheel 150 feet activity        Refer to Care Plan for Long Term Goals  SHORT TERM GOAL WEEK 1 PT Short Term Goal 1 (Week 1): STG = LTG 2/2 LOS  Recommendations for other services: None   Skilled Therapeutic  Intervention Mobility Bed Mobility Bed Mobility: Supine to Sit;Sit to Supine Supine to Sit: Minimal Assistance - Patient > 75% Sit to Supine: Minimal Assistance - Patient > 75% Transfers Transfers: Sit to Stand;Stand to Sit;Stand Pivot Transfers Sit to Stand: Minimal Assistance - Patient > 75%;Moderate Assistance - Patient 50-74% Stand to Sit: Minimal Assistance - Patient > 75%;Moderate Assistance - Patient 50-74% Stand Pivot Transfers: Minimal Assistance - Patient > 75%;Moderate Assistance - Patient 50 - 74% Transfer (Assistive device): Rolling walker Locomotion   Gait Ambulation: No Gait Gait: No Stairs / Additional Locomotion Stairs: No Wheelchair Mobility Wheelchair Mobility: Yes Wheelchair Assistance: Chartered loss adjuster: Both upper extremities Wheelchair Parts Management: Supervision/cueing   Session Note: Chart reviewed and pt agreeable to therapy. Pt received semi-reclined in bed with 7/10 c/o pain in RLE. Session focused on evaluation and functional mobility to promote safe home access. Pt initiated session with evaluation as documented above. Pt then transferred to EOB using MinA. Pt then completed dynamic sit balance activities of reaching outside of BOS with CGA Pt then transferred to [facility] following VC for safe task sequencing and requiring MinA + RW. Pt then transferred to therapy gym for time conservation. In gym, pt completed car transfer from/to WC with [place] + RW and continued VC for safety. Pt required extra time for transfer in setting of low activity tolerance. Pt then expressed need for toileting. Pt returned to room and required [place] + RW for transfer to/from toilet. Once finished, pt practiced transfer to bed with emphasis on WC and required MinA to return to bed. In bed, pt confirmed understanding of therapy goals. At end of session, pt was left semi-reclined in bed with alarm engaged, nurse call bell and all needs in reach.   Discharge Criteria: Patient will be discharged from PT if patient refuses treatment 3 consecutive times without medical reason, if treatment goals not met, if there is a change in medical status, if patient makes no progress towards goals or if patient is discharged from hospital.  The above assessment, treatment plan, treatment alternatives and goals were discussed and mutually agreed upon: by patient  Marquette Old 07/06/2022, 2:17 PM

## 2022-07-06 NOTE — Progress Notes (Signed)
PROGRESS NOTE   Subjective/Complaints:  Pt sees psychiatry as OP, states she takes klonopin 0.'5mg'$  BID , discussed that it is prn and she will need to ask for it , c/o stump pain   Objective:   [place] EKG SITE RITE  Result Date: 07/05/2022 If Site Rite image not attached, placement could not be confirmed due to current cardiac rhythm.  [place] EKG SITE RITE  Result Date: 07/05/2022 If Site Rite image not attached, placement could not be confirmed due to current cardiac rhythm.  Recent Labs    07/05/22 0607 07/06/22 0208  WBC 9.6 8.8  HGB 12.0 11.3*  HCT 36.3 35.0*  PLT 164 154   Recent Labs    07/05/22 0607 07/06/22 0208  NA 135 132*  K 3.9 3.9  CL 97* 94*  CO2 30 29  GLUCOSE 86 104*  BUN 15 13  CREATININE 0.64 0.55  CALCIUM 9.3 8.3*    Intake/Output Summary (Last 24 hours) at 07/06/2022 0908 Last data filed at 07/06/2022 0756 Gross per 24 hour  Intake 782 ml  Output 700 ml  Net 82 ml        Physical Exam: Vital Signs Blood pressure 94/67, pulse 86, temperature 98.3 F (36.8 C), temperature source Oral, resp. rate 16, height '4\' 11"'$  (1.499 m), weight 69.8 kg, SpO2 93 %.   General: No acute distress Mood and affect are appropriate Heart: Regular rate and rhythm no rubs murmurs or extra sounds Lungs: Clear to auscultation, breathing unlabored, no rales or wheezes Abdomen: Positive bowel sounds, soft nontender to palpation, nondistended Extremities: No clubbing, cyanosis, or edema Skin: No evidence of breakdown, no evidence of rash Neurologic: Cranial nerves II through XII intact, motor strength is 5/5 in bilateral deltoid, bicep, tricep, grip, B hip flexor,left  knee extensors, ankle dorsiflexor and plantar flexor  Musculoskeletal: Full range of motion in all 4 extremities. No joint swelling RIght BKA wound vac , cannot visualize incision   Assessment/Plan: 1. Functional deficits which require 3+ hours  per day of interdisciplinary therapy in a comprehensive inpatient rehab setting. Physiatrist is providing close team supervision and 24 hour management of active medical problems listed below. Physiatrist and rehab team continue to assess barriers to discharge/monitor patient progress toward functional and medical goals  Care Tool:  Bathing    Body parts bathed by patient: Right arm, Left arm, Chest, Abdomen, Front perineal area, Right upper leg, Left upper leg, Left lower leg, Face   Body parts bathed by helper: Buttocks Body parts n/a: Right lower leg   Bathing assist Assist Level: Minimal Assistance - Patient > 75%     Upper Body Dressing/Undressing Upper body dressing   What is the patient wearing?: Pull over shirt, Bra    Upper body assist Assist Level: Supervision/Verbal cueing    Lower Body Dressing/Undressing Lower body dressing      What is the patient wearing?: Underwear/pull up, Pants     Lower body assist Assist for lower body dressing: Moderate Assistance - Patient 50 - 74%     Toileting Toileting    Toileting assist Assist for toileting: Moderate Assistance - Patient 50 - 74%     Transfers  Chair/bed transfer  Transfers assist     Chair/bed transfer assist level: Moderate Assistance - Patient 50 - 74%     Locomotion Ambulation   Ambulation assist              Walk 10 feet activity   Assist           Walk 50 feet activity   Assist           Walk 150 feet activity   Assist           Walk 10 feet on uneven surface  activity   Assist           Wheelchair     Assist               Wheelchair 50 feet with 2 turns activity    Assist            Wheelchair 150 feet activity     Assist          Blood pressure 94/67, pulse 86, temperature 98.3 F (36.8 C), temperature source Oral, resp. rate 16, height '4\' 11"'$  (1.499 m), weight 69.8 kg, SpO2 93 %.  Medical Problem List and Plan: 1.  Functional deficits secondary to right BKA             -patient may shower with tubing clamped, vac disconnected. Do not submerge.              -ELOS/Goals: 10-14 days             - Lives alone in handicapped accessible 1 floor apartment    2.  Antithrombotics: -DVT/anticoagulation:  Pharmaceutical: Lovenox             -antiplatelet therapy: none             -LUE duplex ordered on admission for edema   3. Pain Management: Tylenol, Robaxin, Dilaudid 4 mg q 4 hours as needed   4. Mood/Behavior/Sleep: LCSW to evaluate and provide emotional support             -h/o bipolar>>continue Abilify, Prozac             -h/o anxiety>>continue clonazepam 0.5 mg BID as needed             -antipsychotic agents: n/a   5. Neuropsych/cognition: This patient is capable of making decisions on her own behalf.             -SLP eval for difficulty with higher cognitive tasks   6. Skin/Wound Care: Routine skin care checks             -  maintain negative pressure dressing dressing through next Tuesday 10/31             - Wound vac per ortho              7. 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 Juleen China: At discharge from rehab, DC PICC after 1x dose of oritavancin; then transition to oral linezolid to complete course at 16/10   9: Alcoholic cirrhosis/ascites/elevated LFTs/esophageal varices:             -  continue Lasix 40 mg daily             -continue lactulose 20 grams BID (goal 2-3 bowel movements daily)             -continue nadolol 20 mg daily             -continue aldactone 100 mg daily             -continue Folvite, thiamine supplementation             -Gets OP Qmonthly ascites taps; will need to arrange with IR while admitted   10: Seizure disorder: continue  Keppra   11. History polysubstance abuse            - Hx alcohol, THC - none current per patient            - Plan to DC picc prior to discharge as above   12. AKI on CKD stage 3. Admission labs. Baseline Cr Appears 0.4-0.7.       Latest Ref Rng & Units 07/06/2022    2:08 AM 07/05/2022    6:07 AM 07/04/2022    4:49 AM  BMP  Glucose 70 - 99 mg/dL 104  86  109   BUN 6 - 20 mg/dL '13  15  16   '$ Creatinine 0.44 - 1.00 mg/dL 0.55  0.64  0.48   Sodium 135 - 145 mmol/L 132  135  134   Potassium 3.5 - 5.1 mmol/L 3.9  3.9  4.7   Chloride 98 - 111 mmol/L 94  97  99   CO2 22 - 32 mmol/L '29  30  27   '$ Calcium 8.9 - 10.3 mg/dL 8.3  9.3  8.8     AKI resolved   13.  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

## 2022-07-06 NOTE — Plan of Care (Signed)
  Problem: Consults Goal: RH LIMB LOSS PATIENT EDUCATION Description: Description: See Patient Education module for eduction specifics. Outcome: Progressing   Problem: RH BOWEL ELIMINATION Goal: RH STG MANAGE BOWEL WITH ASSISTANCE Description: STG Manage Bowel with min Assistance. Outcome: Progressing Goal: RH STG MANAGE BOWEL W/MEDICATION W/ASSISTANCE Description: STG Manage Bowel with Medication with min Assistance. Outcome: Progressing   Problem: RH BLADDER ELIMINATION Goal: RH STG MANAGE BLADDER WITH ASSISTANCE Description: STG Manage Bladder With mod I Assistance Outcome: Progressing   Problem: RH SKIN INTEGRITY Goal: RH STG SKIN FREE OF INFECTION/BREAKDOWN Description: Skin will be free of infection/breakdown with min assist Outcome: Progressing Goal: RH STG MAINTAIN SKIN INTEGRITY WITH ASSISTANCE Description: STG Maintain Skin Integrity With min Assistance. Outcome: Progressing Goal: RH STG ABLE TO PERFORM INCISION/WOUND CARE W/ASSISTANCE Description: STG Able To Perform Incision/Wound Care With mod I Assistance. Outcome: Progressing   Problem: RH SAFETY Goal: RH STG ADHERE TO SAFETY PRECAUTIONS W/ASSISTANCE/DEVICE Description: STG Adhere to Safety Precautions With mod I Assistance/Device. Outcome: Progressing   Problem: RH PAIN MANAGEMENT Goal: RH STG PAIN MANAGED AT OR BELOW PT'S PAIN GOAL Description: Pain will be managed at 4 out of 10 on pain scale with prn medications Outcome: Progressing   Problem: RH KNOWLEDGE DEFICIT LIMB LOSS Goal: RH STG INCREASE KNOWLEDGE OF SELF CARE AFTER LIMB LOSS Description: Patient will be able to managed medications and self care related to BKA from nursing education and educational handouts independently  Outcome: Progressing   Problem: Education: Goal: Knowledge of the prescribed therapeutic regimen will improve Outcome: Progressing Goal: Ability to verbalize activity precautions or restrictions will improve Outcome:  Progressing Goal: Understanding of discharge needs will improve Outcome: Progressing   Problem: Activity: Goal: Ability to perform//tolerate increased activity and mobilize with assistive devices will improve Outcome: Progressing   Problem: Clinical Measurements: Goal: Postoperative complications will be avoided or minimized Outcome: Progressing   Problem: Self-Care: Goal: Ability to meet self-care needs will improve Outcome: Progressing   Problem: Self-Concept: Goal: Ability to maintain and perform role responsibilities to the fullest extent possible will improve Outcome: Progressing   Problem: Pain Management: Goal: Pain level will decrease with appropriate interventions Outcome: Progressing   Problem: Skin Integrity: Goal: Demonstration of wound healing without infection will improve Outcome: Progressing

## 2022-07-06 NOTE — Evaluation (Signed)
Occupational Therapy Assessment and Plan  Patient Details  Name: Briana Sweeney MRN: [id number] Date of Birth: 1971/04/17  OT Diagnosis: acute pain, cognitive deficits, muscle weakness (generalized), and swelling of limb Rehab Potential: Rehab Potential (ACUTE ONLY): Good ELOS: 10-14 days   Today's Date: 07/06/2022 OT Individual Time: 0700-0809 OT Individual Time Calculation (min): 69 min     Hospital Problem: Principal Problem:   Osteomyelitis of right foot ([place])   Past Medical History:  Past Medical History:  Diagnosis Date   Abscess of bursa of left elbow 09/23/2018   Acute hyperactive alcohol withdrawal delirium ([place]) 10/09/2017   Acute low back pain with bilateral sciatica    Acute metabolic encephalopathy 57/32/2025   Acute on chronic pancreatitis ([place]) 04/04/2021   AKI (acute kidney injury) ([place]) 04/04/2021   Alcohol withdrawal syndrome with complication ([place]) 42/70/6237   Alcohol-induced acute pancreatitis    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[place]) 12/05/2018   Ascites due to alcoholic cirrhosis ([place])    Bipolar affective disorder ([place])    With anxiety features   Bunionette of right foot 11/2019   Cirrhosis of liver ([place])    Due to alcohol and hepatitis C   Cocaine dependence without complication ([place]) 62/83/1517   Decompensated hepatic cirrhosis ([place]) 07/23/2017   Depression with anxiety    Epistaxis 01/20/2019   Erysipelas 09/13/2018   Esophageal 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 (H[facility])    Esophageal varices without bleeding (H[facility])    ETOHism ([facility])    GERD (gastroesophageal reflux disease)    Head injury    Hematemesis 02/10/2018   Hepatitis C 2018   hepatitis c and alcohol related hepatitis   Heroin abuse ([place])    History of blood transfusion    "blood doesn't clot; I fell down and had to have a transfusion"   History of kidney stones    Menopause 2016   Migraine    "when I get really stressed" (09/01/2017)   Neuropathy 10/27/2018   Olecranon bursitis of left elbow     Other mixed anxiety disorders 10/27/2018   Overdose 08/22/2018   Pancytopenia ([place]) 07/23/2017   Polysubstance abuse ([place]) 04/08/2018   Portal hypertension ([place]) 03/18/2018   S/P foot surgery, right 11/2019   Schizophrenia ([place])    Seizures ([place])    "when I run out of my RX; lots recently" (09/01/2017)   Suicidal ideation    Thrombocytopenia ([place])    Trichimoniasis 10/30/2017   Upper GI bleed 02/10/2018   Urinary urgency 05/2020   UTI (urinary tract infection) 06/02/2017   Past Surgical History:  Past Surgical History:  Procedure Laterality Date   AMPUTATION Right 07/02/2022   Procedure: RIGHT BELOW KNEE AMPUTATION;  Surgeon: Newt Minion, MD;  Location: [place];  Service: Orthopedics;  Laterality: Right;   APPLICATION OF WOUND VAC Right 07/02/2022   Procedure: APPLICATION OF WOUND VAC;  Surgeon: Newt Minion, MD;  Location: [facility];  Service: Orthopedics;  Laterality: Right;   BUNIONECTOMY     ESOPHAGEAL BANDING  06/26/2021   Procedure: ESOPHAGEAL BANDING;  Surgeon: Jackquline Denmark, MD;  Location: [facility] ENDOSCOPY;  Service: Endoscopy;;   ESOPHAGOGASTRODUODENOSCOPY N/A 09/03/2017   Procedure: ESOPHAGOGASTRODUODENOSCOPY (EGD);  Surgeon: Doran Stabler, MD;  Location: [place];  Service: Gastroenterology;  Laterality: N/A;   ESOPHAGOGASTRODUODENOSCOPY N/A 02/01/2022   Procedure: ESOPHAGOGASTRODUODENOSCOPY (EGD);  Surgeon: Clarene Essex, MD;  Location: [facility];  Service: Gastroenterology;  Laterality: N/A;   ESOPHAGOGASTRODUODENOSCOPY (EGD) WITH PROPOFOL N/A 06/26/2021   Procedure: ESOPHAGOGASTRODUODENOSCOPY (EGD) WITH PROPOFOL;  Surgeon:  Jackquline Denmark, MD;  Location: [facility] ENDOSCOPY;  Service: Endoscopy;  Laterality: N/A;   FINGER FRACTURE SURGERY Left    "shattered my pinky"   FRACTURE SURGERY     I & D EXTREMITY Left 09/18/2018   Procedure: IRRIGATION AND DEBRIDEMENT EXTREMITY;  Surgeon: Leanora Cover, MD;  Location: [facility];  Service: Orthopedics;  Laterality: Left;   IR  PARACENTESIS  07/23/2017   IR PARACENTESIS  07/2017   "did it twice in the same week" (09/01/2017)   IR PARACENTESIS  06/26/2021   SHOULDER OPEN ROTATOR CUFF REPAIR Right    TOOTH EXTRACTION  08/2019   TUBAL LIGATION     VAGINAL HYSTERECTOMY      Assessment & Plan Clinical Impression: 51 y.o. female admitted 10/21 with Cellulitis and osteomyelitis of right foot, Acute renal failure superimposed on stage 3a chronic kidney disease, Severe protein-calorie malnutrition, Hepatic cirrhosis. 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  Prior to hospitalization, patient could complete BADL/IADL with independent .  Patient will benefit from skilled intervention to increase independence with basic self-care skills and increase level of independence with iADL prior to discharge home with care partner.  Anticipate patient will require intermittent supervision and follow up home health.  OT - End of Session Activity Tolerance: Tolerates 30+ min activity with multiple rests Endurance Deficit: Yes OT Assessment Rehab Potential (ACUTE ONLY): Good OT Barriers to Discharge: Decreased caregiver support;Lack of/limited family support;Weight bearing restrictions OT Patient demonstrates impairments in the following area(s): Balance;Cognition;Edema;Endurance;Motor;Nutrition;Pain;Safety;Sensory;Skin Integrity OT Basic ADL's Functional Problem(s): Grooming;Bathing;Dressing;Toileting OT Transfers Functional Problem(s): Toilet;Tub/Shower OT Additional Impairment(s): Fuctional Use of Upper Extremity OT Plan OT Intensity: Minimum of 1-2 x/day, 45 to 90 minutes OT Frequency: 5 out of 7 days OT  Duration/Estimated Length of Stay: 10-14 days OT Treatment/Interventions: Balance/vestibular training;DME/adaptive equipment instruction;Patient/family education;Therapeutic Activities;Wheelchair propulsion/positioning;Therapeutic Exercise;Psychosocial support;Cognitive remediation/compensation;Community reintegration;Functional mobility training;Self Care/advanced ADL retraining;UE/LE Strength taining/ROM;UE/LE Coordination activities;Skin care/wound managment;Neuromuscular re-education;Discharge planning;Disease mangement/prevention;Splinting/orthotics;Pain management;Visual/perceptual remediation/compensation OT Self Feeding Anticipated Outcome(s): no goal OT Basic Self-Care Anticipated Outcome(s): MOD I OT Toileting Anticipated Outcome(s): MOD I OT Bathroom Transfers Anticipated Outcome(s): MOD I OT Recommendation Patient destination: Home Follow Up Recommendations: Home health OT Equipment Recommended: 3 in 1 bedside comode;Tub/shower bench Equipment Details: DAC?   OT Evaluation Precautions/Restrictions  Precautions Precautions: Fall Precaution Comments: Reviewed verbally Restrictions Weight Bearing Restrictions: Yes RLE Weight Bearing: Non weight bearing General Chart Reviewed: Yes Family/Caregiver Present: No Vital Signs Therapy Vitals Temp: 98.3 F (36.8 C) Temp Source: Oral Pulse Rate: 86 Resp: 16 BP: 94/67 Oxygen Therapy SpO2: 93 % Pain Pain Assessment Pain Scale: 0-10 Pain Score: 9  Pain Type: Surgical pain Pain Location: Leg Pain Orientation: Right Home Living/Prior Functioning Home Living Family/patient expects to be discharged to:: Private residence Living Arrangements: Alone Available Help at Discharge: Neighbor, Available PRN/intermittently Type of Home:  (disabilityelevator) Home Access: Elevator Home Layout: One level Bathroom Shower/Tub: Tub/shower unit, Architectural technologist: Standard Bathroom Accessibility: Yes Additional Comments: maybe  accessible via w/c  Lives With: Alone IADL History Homemaking Responsibilities: Yes Meal Prep Responsibility: Primary Laundry Responsibility: Primary Cleaning Responsibility: Primary Bill Paying/Finance Responsibility: Primary Shopping Responsibility: Primary Child Care Responsibility: Primary Homemaking Comments: takes R cat to the grocery store or the neighbor will take her Current License: No Leisure and Hobbies: play  with her dog--rabbit Prior Function Level of Independence: Independent with basic ADLs, Independent with homemaking with ambulation Driving: No Vision Baseline Vision/History: 1 Wears glasses Ability to See in Adequate Light: 0 Adequate Patient Visual Report: No change from baseline Vision Assessment?: No apparent visual deficits Perception  Perception: Within Functional Limits Praxis Praxis: Intact Cognition Cognition Overall Cognitive Status: Within Functional Limits for tasks assessed Arousal/Alertness: Awake/alert Orientation Level: Person;Place;Situation Person: Oriented Place: Oriented Situation: Oriented Memory: Impaired Brief Interview for Mental Status (BIMS) Repetition of Three Words (First Attempt): 3 Temporal Orientation: Year: Missed by 1 year Temporal Orientation: Month: Accurate within 5 days Temporal Orientation: Day: Correct Recall: "Sock": No, could not recall Recall: "Blue": Yes, no cue required Recall: "Bed": No, could not recall BIMS Summary Score: 10 Sensation Sensation Light Touch: Appears Intact Coordination Gross Motor Movements are Fluid and Coordinated: Yes Fine Motor Movements are Fluid and Coordinated: Yes Finger Nose Finger Test: [facility] Motor  Motor Motor: Within Functional Limits  Trunk/Postural Assessment  Cervical Assessment Cervical Assessment: Within Functional Limits Thoracic Assessment Thoracic Assessment: Within Functional Limits Lumbar Assessment Lumbar Assessment: Within Functional Limits Postural  Control Postural Control: Deficits on evaluation Righting Reactions: delayed standing>sitting  Balance Balance Balance Assessed: Yes Dynamic Sitting Balance Dynamic Sitting - Level of Assistance: 5: Stand by assistance Static Standing Balance Static Standing - Level of Assistance: 4: Min assist Dynamic Standing Balance Dynamic Standing - Level of Assistance: 3: Mod assist Extremity/Trunk Assessment RUE Assessment RUE Assessment: Exceptions to [facility] General Strength Comments: geeneralized weakness >LUE, PTA rotator cuff surgery LUE Assessment LUE Assessment: Exceptions to [facility] General Strength Comments: generalized weakness  Care Tool Care Tool Self Care Eating        Oral Care    Oral Care Assist Level: Set up assist    Bathing   Body parts bathed by patient: Right arm;Left arm;Chest;Abdomen;Front perineal area;Right upper leg;Left upper leg;Left lower leg;Face Body parts bathed by helper: Buttocks Body parts n/a: Right lower leg Assist Level: Minimal Assistance - Patient > 75%    Upper Body Dressing(including orthotics)   What is the patient wearing?: Pull over shirt;Bra   Assist Level: Supervision/Verbal cueing    Lower Body Dressing (excluding footwear)   What is the patient wearing?: Underwear/pull up;Pants Assist for lower body dressing: Moderate Assistance - Patient 50 - 74%    Putting on/Taking off footwear   What is the patient wearing?: Non-skid slipper socks Assist for footwear: Supervision/Verbal cueing       Care Tool Toileting Toileting activity   Assist for toileting: Moderate Assistance - Patient 50 - 74%     Care Tool Bed Mobility Roll left and right activity        Sit to lying activity        Lying to sitting on side of bed activity         Care Tool Transfers Sit to stand transfer   Sit to stand assist level: Moderate Assistance - Patient 50 - 74%    Chair/bed transfer   Chair/bed transfer assist level: Moderate Assistance - Patient  50 - 74%     Toilet transfer   Assist Level: Moderate Assistance - Patient 50 - 74%     Care Tool Cognition  Expression of Ideas and Wants    Understanding Verbal and Non-Verbal Content     Memory/Recall Ability     Refer to Care Plan for Long Term Goals  SHORT TERM GOAL WEEK 1 OT Short Term Goal 1 (Week 1):  Pt will complete toilet transfers with supervision OT Short Term Goal 2 (Week 1): Pt will complete 2/3 components of toileting with supervision OT Short Term Goal 3 (Week 1): Pt will don LB clothing with CGA at sit to stand level OT Short Term Goal 4 (Week 1): Pt will initate IADL retraining in prep for living alone  Recommendations for other services: Therapeutic Recreation  Pet therapy   Skilled Therapeutic Intervention Pt received in bed agreeable to OT with pain premedicated but still high in residual limb. Edu re OT role/purpose, CIR, ELOS, goals and limb loss recovery provided. Pt completes BADL at EOB with MIN-MOD A for LB dressing and lateral leans with supervision. Pt with tight pants and OT educated on asking for larger clothing for comfort. Pt agreed. Pt overall with slow processing and decreased safety awareness impacting performance of BADLs and trasnfers. Squat pivot transfers with MIN A and cuing for placement of hands and stand pivot transfers completed with Min A power up then MOD A for hopping back to bed. Pt required cuing for steering w/c but overall supervision for in room. Exited session with pt seated in bed, exit alarm on and call light in reach  Mobility  Bed Mobility Bed Mobility: Supine to Sit;Sit to Supine Supine to Sit: Minimal Assistance - Patient > 75% Sit to Supine: Minimal Assistance - Patient > 75% Transfers Sit to Stand: Minimal Assistance - Patient > 75%;Moderate Assistance - Patient 50-74% Stand to Sit: Minimal Assistance - Patient > 75%;Moderate Assistance - Patient 50-74%   Discharge Criteria: Patient will be discharged from OT if patient  refuses treatment 3 consecutive times without medical reason, if treatment goals not met, if there is a change in medical status, if patient makes no progress towards goals or if patient is discharged from hospital.  The above assessment, treatment plan, treatment alternatives and goals were discussed and mutually agreed upon: by patient  Tonny Branch 07/06/2022, 8:06 AM

## 2022-07-06 NOTE — Progress Notes (Signed)
Pharmacy Antibiotic Note  Briana Sweeney is a 51 y.o. female admitted on 07/05/2022 with  R ankle injury and concern for infection . Blood cx (1 of 2) obtained on admission with MRSA.  Patient was initially on Vancomycin, last dose 10/21 at 9pm.  In the setting of AKI with SCr up 2.2 (baseline <1) pharmacy has been consulted for Daptomycin dosing.   On day 7 of daptomycin, CK was 15 on 10/23.  Plan: Daptomycin '500mg'$  (~'8mg'$ /kg for bacteremia) IV q24 hours while inpatient Per ID, will give oritavancin 1200 mg x1 followed by linezolid 600 mg BID for 7 days on discharge Monitor weekly CK, clinical status  Height: '4\' 11"'$  (149.9 cm) Weight: 69.8 kg (153 lb 14.1 oz) IBW/kg (Calculated) : 43.2  Temp (24hrs), Avg:98.3 F (36.8 C), Min:98.2 F (36.8 C), Max:98.4 F (36.9 C)  Recent Labs  Lab 06/29/22 1540 06/29/22 2305 06/30/22 0311 06/30/22 1633 06/30/22 1958 07/01/22 0327 07/02/22 0457 07/03/22 0010 07/04/22 0449 07/05/22 0607 07/06/22 0208  WBC 19.3*  --    < >  --   --    < > 12.6* 9.8 14.4* 9.6 8.8  CREATININE 1.68*  --    < > 1.66*  --    < > 0.71 0.59 0.48 0.64 0.55  LATICACIDVEN 2.1* 1.7  --  2.2* 2.7*  --   --   --   --   --   --    < > = values in this interval not displayed.     Estimated Creatinine Clearance: 71.5 mL/min (by C-G formula based on SCr of 0.55 mg/dL).    Allergies  Allergen Reactions   Other Other (See Comments)    Platelets: Rx chest pain, tremors, body aches  Patient stated that she receives platelet injection every Wednesday     Antimicrobials this admission: Rocephin 10/21 >> 10/22 Vanc  10/21 >> 10/22 Daptomycin 10/22 >>  Dose adjustments this admission:  Microbiology results: 10/21 BCx: 1/2 GCP, BCID with MRSA  Louanne Belton, PharmD, [facility] PGY1 Pharmacy Resident 07/06/2022 11:17 AM

## 2022-07-06 NOTE — Evaluation (Signed)
Speech Language Pathology Assessment and Plan  Patient Details  Name: Briana Sweeney MRN: [id number] Date of Birth: April 27, 1971  SLP Diagnosis: Cognitive Impairments  Rehab Potential: Good ELOS: 10-14 dats    Today's Date: 07/06/2022 SLP Individual Time: 4034-7425 SLP Individual Time Calculation (min): 35 min   Hospital Problem: Principal Problem:   Osteomyelitis of right foot ([place])  Past Medical History:  Past Medical History:  Diagnosis Date   Abscess of bursa of left elbow 09/23/2018   Acute hyperactive alcohol withdrawal delirium ([place]) 10/09/2017   Acute low back pain with bilateral sciatica    Acute metabolic encephalopathy 95/63/8756   Acute on chronic pancreatitis ([place]) 04/04/2021   AKI (acute kidney injury) ([place]) 04/04/2021   Alcohol withdrawal syndrome with complication ([place]) 43/32/9518   Alcohol-induced acute pancreatitis    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[place]) 12/05/2018   Ascites due to alcoholic cirrhosis ([place])    Bipolar affective disorder ([place])    With anxiety features   Bunionette of right foot 11/2019   Cirrhosis of liver ([place])    Due to alcohol and hepatitis C   Cocaine dependence without complication ([place]) 84/16/6063   Decompensated hepatic cirrhosis ([place]) 07/23/2017   Depression with anxiety    Epistaxis 01/20/2019   Erysipelas 09/13/2018   Esophageal 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 (H[facility])    Esophageal varices without bleeding (H[facility])    ETOHism (H[facility])    GERD (gastroesophageal reflux disease)    Head injury    Hematemesis 02/10/2018   Hepatitis C 2018   hepatitis c and alcohol related hepatitis   Heroin abuse ([place])    History of blood transfusion    "blood doesn't clot; I fell down and had to have a transfusion"   History of kidney stones    Menopause 2016   Migraine    "when I get really stressed" (09/01/2017)   Neuropathy 10/27/2018   Olecranon bursitis of left elbow    Other mixed anxiety disorders 10/27/2018   Overdose 08/22/2018   Pancytopenia ([place])  07/23/2017   Polysubstance abuse ([place]) 04/08/2018   Portal hypertension ([place]) 03/18/2018   S/P foot surgery, right 11/2019   Schizophrenia ([place])    Seizures ([place])    "when I run out of my RX; lots recently" (09/01/2017)   Suicidal ideation    Thrombocytopenia ([place])    Trichimoniasis 10/30/2017   Upper GI bleed 02/10/2018   Urinary urgency 05/2020   UTI (urinary tract infection) 06/02/2017   Past Surgical History:  Past Surgical History:  Procedure Laterality Date   AMPUTATION Right 07/02/2022   Procedure: RIGHT BELOW KNEE AMPUTATION;  Surgeon: Newt Minion, MD;  Location: [facility];  Service: Orthopedics;  Laterality: Right;   APPLICATION OF WOUND VAC Right 07/02/2022   Procedure: APPLICATION OF WOUND VAC;  Surgeon: Newt Minion, MD;  Location: [facility];  Service: Orthopedics;  Laterality: Right;   BUNIONECTOMY     ESOPHAGEAL BANDING  06/26/2021   Procedure: ESOPHAGEAL BANDING;  Surgeon: Jackquline Denmark, MD;  Location: [facility] ENDOSCOPY;  Service: Endoscopy;;   ESOPHAGOGASTRODUODENOSCOPY N/A 09/03/2017   Procedure: ESOPHAGOGASTRODUODENOSCOPY (EGD);  Surgeon: Doran Stabler, MD;  Location: [facility];  Service: Gastroenterology;  Laterality: N/A;   ESOPHAGOGASTRODUODENOSCOPY N/A 02/01/2022   Procedure: ESOPHAGOGASTRODUODENOSCOPY (EGD);  Surgeon: Clarene Essex, MD;  Location: [facility];  Service: Gastroenterology;  Laterality: N/A;   ESOPHAGOGASTRODUODENOSCOPY (EGD) WITH PROPOFOL N/A 06/26/2021   Procedure: ESOPHAGOGASTRODUODENOSCOPY (EGD) WITH PROPOFOL;  Surgeon: Jackquline Denmark, MD;  Location: [facility] ENDOSCOPY;  Service: Endoscopy;  Laterality: N/A;  FINGER FRACTURE SURGERY Left    "shattered my pinky"   FRACTURE SURGERY     I & D EXTREMITY Left 09/18/2018   Procedure: IRRIGATION AND DEBRIDEMENT EXTREMITY;  Surgeon: Leanora Cover, MD;  Location: [facility];  Service: Orthopedics;  Laterality: Left;   IR PARACENTESIS  07/23/2017   IR PARACENTESIS  07/2017   "did it twice in the same week"  (09/01/2017)   IR PARACENTESIS  06/26/2021   SHOULDER OPEN ROTATOR CUFF REPAIR Right    TOOTH EXTRACTION  08/2019   TUBAL LIGATION     VAGINAL HYSTERECTOMY      Assessment / Plan / Recommendation Clinical Impression  51 year old with history of hepatic cirrhosis,hepatitis C, esophageal varices, polysubstance abuse, and seizure disorder.  Presented on 06/29/22 with progressive skin and soft tissue infection of the right foot. MRI of the foot shows extensive osteomyelitis throughout the bones of the midfoot, large volume tenosynovitis, extensive myositis and cellulitis.  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 Post op with wound VAC. ID consulted.  Per id recommendations to continue Daptomycin while in hospital and on CIR. ID team to be notified at time of discharge. Would then plan a dose of oritavancin at discharge followed by oral linezolid 7 days later until 11/21. Likely plan prolonged course of linezolid for MRSA bacteremia. This was confirmed by ID Dr Juleen China. Admitted to CIR on 07/05/22.   Pt presents with severe-moderate deficits primarily in recall and sustained attention, impacting basic-mildly complex problem solving and emergent awareness. Formal cognitive linguistic assessment SLUMS was given, pt scored 12/30 (n>27) indicating severe deficits in recall, attention, error awareness and mild deficits in problem solving. Pt demonstrates delayed processing and required repetition of instructions. Pt supports baseline deficits in memory, requiring medication and use of written aids. Pt denies acute changes in swallow and speech function. Pt would benefit from skilled ST services in order to maximize functional independence and reduce burden of care, likely requiring 24 hour supervision at discharge with continued skilled ST services.    Skilled Therapeutic Interventions          Skilled ST services focused on cognitive skills. SLP facilitated administration of cognitive linguistic  formal assessment and provided education of results. SLP and pt collaborated to set goals for cognitive linguistic needs during length of stay. All questions answered to satisfaction.  Pt was left in room with call bell within reach and bed alarm set. SLP recommends to continue skilled services.    SLP Assessment  Patient will need skilled [place] [facility] Services during CIR admission    Recommendations  Recommendations for Other Services: Neuropsych consult Patient destination: Home Follow up Recommendations: 24 hour supervision/assistance;Outpatient SLP;Home Health SLP Equipment Recommended: None recommended by SLP    SLP Frequency 3 to 5 out of 7 days   SLP Duration  SLP Intensity  SLP Treatment/Interventions 10-14 dats  Minumum of 1-2 x/day, 30 to 90 minutes  Cognitive remediation/compensation;Internal/external aids;Medication managment;Cueing hierarchy;Functional tasks;Patient/family education    Pain Pain Assessment Pain Score: 0-No pain  Prior Functioning Cognitive/Linguistic Baseline: Baseline deficits Baseline deficit details: memory takes medication for it  Lives With: Alone Available Help at Discharge: Neighbor;Available PRN/intermittently  SLP Evaluation Cognition Overall Cognitive Status: Impaired/Different from baseline Arousal/Alertness: Awake/alert Orientation Level: Oriented X4 Attention: Sustained Sustained Attention: Impaired Sustained Attention Impairment: Verbal basic;Functional basic Memory: Impaired Memory Impairment: Storage deficit;Decreased short term memory Awareness: Impaired Awareness Impairment: Emergent impairment Problem Solving: Impaired Problem Solving Impairment: Functional complex;Verbal complex;Functional basic  Safety/Judgment: Appears intact  Comprehension Auditory Comprehension Overall Auditory Comprehension: Impaired Yes/No Questions: Within Functional Limits Commands: Impaired One Step Basic Commands: Other  (comment) (required repetition) Conversation: Complex Interfering Components: Attention;Processing speed;Working Field seismologist: Conservation officer, nature: Within [employer] Reading Comprehension Reading Status: Not tested Expression Expression Primary Mode of Expression: Verbal Verbal Expression Overall Verbal Expression: Appears within functional limits for tasks assessed Oral Motor Oral Motor/Sensory Function Overall Oral Motor/Sensory Function: Within functional limits Motor Speech Overall Motor Speech: Appears within functional limits for tasks assessed  Care Tool Care Tool Cognition Ability to hear (with hearing aid or hearing appliances if normally used Ability to hear (with hearing aid or hearing appliances if normally used): 0. Adequate - no difficulty in normal conservation, social interaction, listening to TV   Expression of Ideas and Wants Expression of Ideas and Wants: 3. Some difficulty - exhibits some difficulty with expressing needs and ideas (e.g, some words or finishing thoughts) or speech is not clear   Understanding Verbal and Non-Verbal Content Understanding Verbal and Non-Verbal Content: 3. Usually understands - understands most conversations, but misses some part/intent of message. Requires cues at times to understand  Memory/Recall Ability Memory/Recall Ability : Current season;That he or she is in a hospital/hospital unit   Short Term Goals: Week 1: SLP Short Term Goal 1 (Week 1): Pt will demonstrate sustained attention in 5-7 minute intervals with min A verbal cues for redirection. SLP Short Term Goal 2 (Week 1): Pt will demonstrate recall with external aids of daily events and information with mod A verbal cues. SLP Short Term Goal 3 (Week 1): Pt will demonstrate basic-mildly complex problem solving skills with mod A verbal cues. SLP Short Term Goal 4 (Week 1): Pt will demonstrate  ability to correct errors in functional tasks with max A verbal cues.  Refer to Care Plan for Long Term Goals  Recommendations for other services: Neuropsych  Discharge Criteria: Patient will be discharged from SLP if patient refuses treatment 3 consecutive times without medical reason, if treatment goals not met, if there is a change in medical status, if patient makes no progress towards goals or if patient is discharged from hospital.  The above assessment, treatment plan, treatment alternatives and goals were discussed and mutually agreed upon: by patient  [name]  [facility] 07/06/2022, 12:56 PM

## 2022-07-07 LAB — CULTURE, BLOOD (ROUTINE X 2)
Culture: NO GROWTH
Culture: NO GROWTH
Special Requests: ADEQUATE
Special Requests: ADEQUATE

## 2022-07-07 NOTE — Progress Notes (Signed)
Patient concerned about lovenox shot due to anal bleeding. Informed Dr and he said it was okay to hold it. Nurse will continue to monitor.

## 2022-07-07 NOTE — Plan of Care (Signed)
  Problem: Consults Goal: RH LIMB LOSS PATIENT EDUCATION Description: Description: See Patient Education module for eduction specifics. Outcome: Progressing   Problem: RH BOWEL ELIMINATION Goal: RH STG MANAGE BOWEL WITH ASSISTANCE Description: STG Manage Bowel with min Assistance. Outcome: Progressing Goal: RH STG MANAGE BOWEL W/MEDICATION W/ASSISTANCE Description: STG Manage Bowel with Medication with min Assistance. Outcome: Progressing   Problem: RH BLADDER ELIMINATION Goal: RH STG MANAGE BLADDER WITH ASSISTANCE Description: STG Manage Bladder With mod I Assistance Outcome: Progressing   Problem: RH SKIN INTEGRITY Goal: RH STG SKIN FREE OF INFECTION/BREAKDOWN Description: Skin will be free of infection/breakdown with min assist Outcome: Progressing Goal: RH STG MAINTAIN SKIN INTEGRITY WITH ASSISTANCE Description: STG Maintain Skin Integrity With min Assistance. Outcome: Progressing Goal: RH STG ABLE TO PERFORM INCISION/WOUND CARE W/ASSISTANCE Description: STG Able To Perform Incision/Wound Care With mod I Assistance. Outcome: Progressing   Problem: RH SAFETY Goal: RH STG ADHERE TO SAFETY PRECAUTIONS W/ASSISTANCE/DEVICE Description: STG Adhere to Safety Precautions With mod I Assistance/Device. Outcome: Progressing   Problem: RH PAIN MANAGEMENT Goal: RH STG PAIN MANAGED AT OR BELOW PT'S PAIN GOAL Description: Pain will be managed at 4 out of 10 on pain scale with prn medications Outcome: Progressing   Problem: RH KNOWLEDGE DEFICIT LIMB LOSS Goal: RH STG INCREASE KNOWLEDGE OF SELF CARE AFTER LIMB LOSS Description: Patient will be able to managed medications and self care related to BKA from nursing education and educational handouts independently  Outcome: Progressing   Problem: Education: Goal: Knowledge of the prescribed therapeutic regimen will improve Outcome: Progressing Goal: Ability to verbalize activity precautions or restrictions will improve Outcome:  Progressing Goal: Understanding of discharge needs will improve Outcome: Progressing   Problem: Activity: Goal: Ability to perform//tolerate increased activity and mobilize with assistive devices will improve Outcome: Progressing   Problem: Clinical Measurements: Goal: Postoperative complications will be avoided or minimized Outcome: Progressing   Problem: Self-Care: Goal: Ability to meet self-care needs will improve Outcome: Progressing   Problem: Self-Concept: Goal: Ability to maintain and perform role responsibilities to the fullest extent possible will improve Outcome: Progressing   Problem: Pain Management: Goal: Pain level will decrease with appropriate interventions Outcome: Progressing   Problem: Skin Integrity: Goal: Demonstration of wound healing without infection will improve Outcome: Progressing

## 2022-07-07 NOTE — Progress Notes (Signed)
Physical Therapy Session Note  Patient Details  Name: Briana Sweeney MRN: [id number] Date of Birth: Nov 15, 1970  Today's Date: 07/07/2022 PT Individual Time: 7412-8786 PT Individual Time Calculation (min): 77 min & 30 min   Short Term Goals: Week 1:  PT Short Term Goal 1 (Week 1): STG = LTG 2/2 LOS  Skilled Therapeutic Interventions/Progress Updates:      Therapy Documentation Precautions:  Precautions Precautions: Fall Precaution Comments: Reviewed verbally Restrictions Weight Bearing Restrictions: Yes RLE Weight Bearing: Non weight bearing  Treatment Session 1:  Pt received semi-reclined in bed agreeable to PT sesssion. Pt with unrated right phantom pain, pt pre-medicated. PT educated pt on importance of keeping right residual limb in extended position and to utilize limb guard to facilitate extension as pt with plans for prosthesis. Pt supervision with supine to sit with head of bed elevated and for scooting to edge of bed. PT dependently donned limb guard and instructed pt on how to utilize. Pt requires min A with squat pivot from bed to w/c and mod A with stand pivot from w/c to [facility]. Pt continent of  bladder and requires supervision for peri-care. Pt min A with squat pivot to w/c with verbal cues for sequencing and hand placement. Pt propelled w/c ~50 ft with close (S) for safety and requires increased time due to decreased activity tolerance. Pt transported remaining distance to dayroom by w/c and participated in mirror LE therapy to address phantom limb pain. Pt performed left ankle pumps, straight leg raises and hip abduction 3 x 10 with mirror for feedback. Pt reports minimal improvement in pain following treatment. Pt tranistioned to prone for hip flexor flexiblity and performed bilateral hip extension 3 x 10. In sitting, pt performed right knee extension 3 x 10. Pt with confusion throughout session and continued to ask the same questions. Pt perseverated on family visiting and  not being able to particpate in PM PT session. Pt transported to room and progressed to CGA with squat pivot to bed and left semi-reclined in bed with all needs in reach and alarm on.     Treatment Session 2:  Pt received semi-reclined in bed agreeable to PT session with emphasis on transfer training. Pt reports 9/10 pain to right residual limb and nursing notified. Pt requires (S) with bed mobility from flat bed and CGA with sit to stand and min A for stand pivot with RW to bedside commode. Pt (S) for peri-care and successfully voided. Pt participated in blocked practice of 3 x 5 sit to stand transfers with RW to facilitate increased strength, balance, and activity tolerance. Pt required 12.7 seconds to complete 5XSTS assessment. Pt transitioned to mini squats with BUE support on RW 3 x 10 with verbal cues for technique. Pt left semi-reclined in bed with all needs in reach and bed alarm on.    Therapy/Group: Individual Therapy  Verl Dicker Verl Dicker PT, DPT  07/07/2022, 7:51 AM

## 2022-07-08 DIAGNOSIS — M869 Osteomyelitis, unspecified: Secondary | ICD-10-CM | POA: Diagnosis not present

## 2022-07-08 LAB — CBC
HCT: 33.1 % — ABNORMAL LOW (ref 36.0–46.0)
Hemoglobin: 10.9 g/dL — ABNORMAL LOW (ref 12.0–15.0)
MCH: 29.2 pg (ref 26.0–34.0)
MCHC: 32.9 g/dL (ref 30.0–36.0)
MCV: 88.7 fL (ref 80.0–100.0)
Platelets: 163 10*3/uL (ref 150–400)
RBC: 3.73 MIL/uL — ABNORMAL LOW (ref 3.87–5.11)
RDW: 29.6 % — ABNORMAL HIGH (ref 11.5–15.5)
WBC: 8.8 10*3/uL (ref 4.0–10.5)
nRBC: 0 % (ref 0.0–0.2)

## 2022-07-08 LAB — BASIC METABOLIC PANEL
Anion gap: 7 (ref 5–15)
BUN: 10 mg/dL (ref 6–20)
CO2: 27 mmol/L (ref 22–32)
Calcium: 8.3 mg/dL — ABNORMAL LOW (ref 8.9–10.3)
Chloride: 100 mmol/L (ref 98–111)
Creatinine, Ser: 0.51 mg/dL (ref 0.44–1.00)
GFR, Estimated: >60 mL/min (ref >60)
Glucose, Bld: 129 mg/dL — ABNORMAL HIGH (ref 70–99)
Potassium: 3.4 mmol/L — ABNORMAL LOW (ref 3.5–5.1)
Sodium: 134 mmol/L — ABNORMAL LOW (ref 135–145)

## 2022-07-08 MED ORDER — POTASSIUM CHLORIDE CRYS ER 20 MEQ PO TBCR
40.0000 meq | EXTENDED_RELEASE_TABLET | Freq: Once | ORAL | Status: AC
  Administered 2022-07-08: 40 meq via ORAL
  Filled 2022-07-08: qty 2

## 2022-07-08 MED ORDER — GABAPENTIN 300 MG PO CAPS
300.0000 mg | ORAL_CAPSULE | Freq: Two times a day (BID) | ORAL | Status: DC
  Administered 2022-07-08 – 2022-07-09 (×3): 300 mg via ORAL
  Filled 2022-07-08 (×3): qty 1

## 2022-07-08 NOTE — Progress Notes (Signed)
Initial Nutrition Assessment  DOCUMENTATION CODES:   Not applicable  INTERVENTION:   Ensure Enlive PO BID, each supplement provides 350 kcal and 20 grams of protein.   Juven 1 packet PO BID, each packet provides 80 calories, 8 grams of carbohydrate, 2.5  grams of protein (collagen), 7 grams of L-arginine and 7 grams of L-glutamine; supplement contains CaHMB, Vitamins C, E, B12 and Zinc to promote wound healing.  MVI with minerals daily.  Banatrol PO BID, each packet provides 45 kcal and 5 gm soluble fiber.   NUTRITION DIAGNOSIS:   Increased nutrient needs related to wound healing, post-op healing as evidenced by estimated needs.  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 PMH includes alcohol cirrhosis, recurrent ascites (receives  monthly paracenteses), esophageal varices, hepatitis C, seizure disorder, bipolar disorder, GERD, schizophrenia, seizures, heroin abuse, alcohol abuse.  Unable to speak with patient or complete NFPE at this time. She has been in the hospital since 10/21. S/P BKA on 10/24.  Intake of meals was variable. Since admission to rehab, meal intakes have been 85-100%. Patient is being offered Ensure Enlive BID, Juven BID, and MVI daily. She has received one Ensure and one Juven supplement so far today. She is taking Banatrol BID to help with diarrhea.   Weight history reviewed. She has not lost any weight over the past 2 months. Weight is gradually trending up, likely d/t swelling with cellulitis and recent BKA.    Labs reviewed. Na 134, K 3.4  Medications reviewed and include vitamin C, Colace, Banatrol, folic acid, Lasix, lactulose, MVI with minerals, Juven, Protonix, potassium chloride, Aldactone,  thiamine, zinc sulfate.   NUTRITION - FOCUSED PHYSICAL EXAM:  Unable to complete  Diet Order:   Diet Order             Diet regular Room service appropriate? Yes; Fluid consistency: Thin  Diet effective now                   EDUCATION NEEDS:   Not appropriate for education at this time  Skin:  Skin Assessment: Skin Integrity Issues: Skin Integrity Issues:: Other (Comment), Wound VAC Wound Vac: R leg BKA Other: fissure to coccyx  Last BM:  10/29 type 7  Height:   Ht Readings from Last 1 Encounters:  07/05/22 '4\' 11"'$  (1.499 m)    Weight:   Wt Readings from Last 1 Encounters:  07/07/22 69.8 kg    BMI:  Body mass index is 31.08 kg/m.  Estimated Nutritional Needs:   Kcal:  1800-2000  Protein:  100-120 gm  Fluid:  1.8-2 L   Lucas Mallow RD, LDN, CNSC Please refer to Amion for contact information.

## 2022-07-08 NOTE — Progress Notes (Signed)
PROGRESS NOTE   Subjective/Complaints:  Pt reports pain is real bothersome for her- isn't on any nerve pain meds- and main issue is phantom pain- will add gabapentin Has 4-7 Bms/day-  Due to cirrhosis.  Wiping anus a lot due to frequency of bowel movements Thinks causing hemorrhoids to act up.  Taking dilaudid ~ q5 hours- throughout.     ROS:  Pt denies SOB, abd pain, CP, N/V/C/D, and vision changes Except per HPI Objective:   VAS [place] UPPER EXTREMITY VENOUS DUPLEX  Result Date: 07/07/2022 UPPER VENOUS STUDY  Patient Name:  Briana Sweeney  Date of Exam:   07/06/2022 Medical Rec #: [id number]          Accession #:    [id number] Date of Birth: Aug 10, 1971          Patient Gender: F Patient Age:   51 years Exam Location:  [facility] Procedure:      VAS [place] UPPER EXTREMITY VENOUS DUPLEX Referring Phys: Durel Salts --------------------------------------------------------------------------------  Indications: Pain, and Edema Comparison Study: Prior negative left UEV done 09/13/18 Performing Technologist: Sharion Dove RVS  Examination Guidelines: A complete evaluation includes B-mode imaging, spectral Doppler, color Doppler, and power Doppler as needed of all accessible portions of each vessel. Bilateral testing is considered an integral part of a complete examination. Limited examinations for reoccurring indications may be performed as noted.  Right Findings: +----------+------------+---------+-----------+----------+-------+ RIGHT     CompressiblePhasicitySpontaneousPropertiesSummary +----------+------------+---------+-----------+----------+-------+ Subclavian               Yes       Yes                      +----------+------------+---------+-----------+----------+-------+  Left Findings: +----------+------------+---------+-----------+----------+-------+ LEFT      CompressiblePhasicitySpontaneousPropertiesSummary  +----------+------------+---------+-----------+----------+-------+ IJV           Full       Yes       Yes                      +----------+------------+---------+-----------+----------+-------+ Subclavian    Full       Yes       Yes                      +----------+------------+---------+-----------+----------+-------+ Axillary      Full       Yes       Yes                      +----------+------------+---------+-----------+----------+-------+ Brachial      Full                                          +----------+------------+---------+-----------+----------+-------+ Radial        Full                                          +----------+------------+---------+-----------+----------+-------+ Ulnar  Full                                          +----------+------------+---------+-----------+----------+-------+ Cephalic      None                                   Acute  +----------+------------+---------+-----------+----------+-------+ Basilic       Full                                          +----------+------------+---------+-----------+----------+-------+ Acute SVT of the cephalic vein mid to proximal forearm, at site of IV. Unable to visualized lower forearm secondary to bandages/IV  Summary:  Right: No evidence of thrombosis in the subclavian.  Left: No evidence of deep vein thrombosis in the upper extremity. Findings consistent with acute superficial vein thrombosis involving the left cephalic vein.  *See table(s) above for measurements and observations.  Diagnosing physician: Jamelle Haring Electronically signed by Jamelle Haring on 07/07/2022 at 2:44:26 PM.    Final    Recent Labs    07/06/22 0208 07/08/22 0419  WBC 8.8 8.8  HGB 11.3* 10.9*  HCT 35.0* 33.1*  PLT 154 163   Recent Labs    07/06/22 0208 07/08/22 0419  NA 132* 134*  K 3.9 3.4*  CL 94* 100  CO2 29 27  GLUCOSE 104* 129*  BUN 13 10  CREATININE 0.55 0.51  CALCIUM 8.3*  8.3*    Intake/Output Summary (Last 24 hours) at 07/08/2022 1147 Last data filed at 07/08/2022 0842 Gross per 24 hour  Intake 960 ml  Output --  Net 960 ml        Physical Exam: Vital Signs Blood pressure 116/69, pulse 99, temperature 98.1 F (36.7 C), temperature source Oral, resp. rate 18, height '4\' 11"'$  (1.499 m), weight 69.8 kg, SpO2 93 %.   General: awake, alert, appropriate, sitting EOB; still wearing make up from yesterday. 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 No joint swelling RIght BKA wound vac , cannot visualize incision   Assessment/Plan: 1. Functional deficits which require 3+ hours per day of interdisciplinary therapy in a comprehensive inpatient rehab setting. Physiatrist is providing close team supervision and 24 hour management of active medical problems listed below. Physiatrist and rehab team continue to assess barriers to discharge/monitor patient progress toward functional and medical goals  Care Tool:  Bathing    Body parts bathed by patient: Right arm, Left arm, Chest, Abdomen, Front perineal area, Right upper leg, Left upper leg, Left lower leg, Face   Body parts bathed by helper: Buttocks Body parts n/a: Right lower leg   Bathing assist Assist Level: Minimal Assistance - Patient > 75%     Upper Body Dressing/Undressing Upper body  dressing   What is the patient wearing?: Pull over shirt, Bra    Upper body  assist Assist Level: Supervision/Verbal cueing    Lower Body Dressing/Undressing Lower body dressing      What is the patient wearing?: Underwear/pull up, Pants     Lower body assist Assist for lower body dressing: Moderate Assistance - Patient 50 - 74%     Toileting Toileting    Toileting assist Assist for toileting: Moderate Assistance - Patient 50 - 74%     Transfers Chair/bed transfer  Transfers assist     Chair/bed transfer assist level: Moderate Assistance - Patient 50 - 74%     Locomotion Ambulation   Ambulation assist   Ambulation activity did not occur: Safety/medical concerns (2/2 wound vac on R BKA)          Walk 10 feet activity   Assist  Walk 10 feet activity did not occur: Safety/medical concerns (2/2 wound vac on R BKA)        Walk 50 feet activity   Assist Walk 50 feet with 2 turns activity did not occur: Safety/medical concerns (2/2 wound vac on R BKA)         Walk 150 feet activity   Assist Walk 150 feet activity did not occur: Safety/medical concerns (2/2 wound vac on R BKA)         Walk 10 feet on uneven surface  activity   Assist Walk 10 feet on uneven surfaces activity did not occur: Safety/medical concerns (2/2 wound vac on R BKA)         Wheelchair     Assist Is the patient using a wheelchair?: Yes Type of Wheelchair: Manual    Wheelchair assist level: Minimal Assistance - Patient > 75%      Wheelchair 50 feet with 2 turns activity    Assist            Wheelchair 150 feet activity     Assist          Blood pressure 116/69, pulse 99, temperature 98.1 F (36.7 C), temperature source Oral, resp. rate 18, height '4\' 11"'$  (1.499 m), weight 69.8 kg, SpO2 93 %.  Medical Problem List and Plan: 1. Functional deficits secondary to right BKA             -patient may shower with tubing clamped, vac disconnected. Do not submerge.              -ELOS/Goals: 10-14 days             - Lives  alone in handicapped accessible 1 floor apartment    Con't CIR- PT and OT 2.  Antithrombotics: -DVT/anticoagulation:  Pharmaceutical: Lovenox             -antiplatelet therapy: none             -LUE duplex ordered on admission for edema   3. Pain Management: Tylenol, Robaxin, Dilaudid 4 mg q 4 hours as needed   10/30- will add gabapentin 300 mg BID- goal to get off pain meds by d/c that are controlled.    4. Mood/Behavior/Sleep: LCSW to evaluate and provide emotional support             -h/o bipolar>>continue Abilify, Prozac             -h/o anxiety>>continue clonazepam 0.5 mg BID as needed             -antipsychotic agents: n/a   5. Neuropsych/cognition:  This patient is capable of making decisions on her own behalf.             -SLP eval for difficulty with higher cognitive tasks   6. Skin/Wound Care: Routine skin care checks             -  maintain negative pressure dressing dressing through next Tuesday 10/31             - Wound vac per ortho              7. 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 Juleen China: At discharge from rehab, DC PICC after 1x dose of oritavancin; then transition to oral linezolid to complete course at 30/07   9: Alcoholic cirrhosis/ascites/elevated LFTs/esophageal varices:             -continue Lasix 40 mg daily             -continue lactulose 20 grams BID (goal 2-3 bowel movements daily)             -continue nadolol 20 mg daily             -continue aldactone 100 mg daily             -continue Folvite, thiamine supplementation             -Gets OP Qmonthly ascites taps; will need to arrange with IR while admitted   10/30- having 4-7 BM's day will need ot check with next paracentesis is needed 10: Seizure disorder:  continue Keppra   11. History polysubstance abuse            - Hx alcohol, THC - none current per patient            - Plan to DC picc prior to discharge as above   12. AKI on CKD stage 3. Admission labs. Baseline Cr Appears 0.4-0.7.       Latest Ref Rng & Units 07/08/2022    4:19 AM 07/06/2022    2:08 AM 07/05/2022    6:07 AM  BMP  Glucose 70 - 99 mg/dL 129  104  86   BUN 6 - 20 mg/dL '10  13  15   '$ Creatinine 0.44 - 1.00 mg/dL 0.51  0.55  0.64   Sodium 135 - 145 mmol/L 134  132  135   Potassium 3.5 - 5.1 mmol/L 3.4  3.9  3.9   Chloride 98 - 111 mmol/L 100  94  97   CO2 22 - 32 mmol/L '27  29  30   '$ Calcium 8.9 - 10.3 mg/dL 8.3  8.3  9.3     AKI resolved   13.  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  15. Hypokalemia  10/30- will replete 40 mEq x1 and recheck later in week   I spent a total of  39  minutes on total care today- >50% coordination of care- due to [place], and d/w nursing and PA about medical issues- also educated pt on my preference for lovenox while Platelets are at a good level due to risk of DVT/PE.    LOS: 3 days A FACE TO FACE EVALUATION WAS PERFORMED  Briana Sweeney 07/08/2022, 11:47 AM

## 2022-07-08 NOTE — Progress Notes (Signed)
Inpatient Rehabilitation  Patient information reviewed and entered into eRehab system by Mendel Binsfeld Ilene Witcher, OTR/L, Rehab Quality Coordinator.   Information including medical coding, functional ability and quality indicators will be reviewed and updated through discharge.   

## 2022-07-08 NOTE — Progress Notes (Signed)
Inpatient Rehabilitation Care Coordinator Assessment and Plan Patient Details  Name: Briana Sweeney MRN: [id number] Date of Birth: [birth year]  Today's Date: 07/08/2022  Hospital Problems: Principal Problem:   Osteomyelitis of right foot [facility])  Past Medical History:  Past Medical History:  Diagnosis Date   Abscess of bursa of left elbow 09/23/2018   Acute hyperactive alcohol withdrawal delirium ([place]) 10/09/2017   Acute low back pain with bilateral sciatica    Acute metabolic encephalopathy 06/25/5101   Acute on chronic pancreatitis ([place]) 04/04/2021   AKI (acute kidney injury) ([place]) 04/04/2021   Alcohol withdrawal syndrome with complication ([place]) 58/52/7782   Alcohol-induced acute pancreatitis    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   Ascites due to alcoholic cirrhosis ([place])    Bipolar affective disorder ([place])    With anxiety features   Bunionette of right foot 11/2019   Cirrhosis of liver ([place])    Due to alcohol and hepatitis C   Cocaine dependence without complication ([place]) 42/35/3614   Decompensated hepatic cirrhosis ([place]) 07/23/2017   Depression with anxiety    Epistaxis 01/20/2019   Erysipelas 09/13/2018   Esophageal 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 ([facility])    Esophageal varices without bleeding ([facility])    ETOHism ([facility])    GERD (gastroesophageal reflux disease)    Head injury    Hematemesis 02/10/2018   Hepatitis C 2018   hepatitis c and alcohol related hepatitis   Heroin abuse ([place])    History of blood transfusion    "blood doesn't clot; I fell down and had to have a transfusion"   History of kidney stones    Menopause 2016   Migraine    "when I get really stressed" (09/01/2017)   Neuropathy 10/27/2018   Olecranon bursitis of left elbow    Other mixed anxiety disorders 10/27/2018   Overdose 08/22/2018   Pancytopenia ([place]) 07/23/2017   Polysubstance abuse ([place]) 04/08/2018   Portal hypertension ([place]) 03/18/2018   S/P foot surgery, right 11/2019   Schizophrenia ([place])     Seizures ([place])    "when I run out of my RX; lots recently" (09/01/2017)   Suicidal ideation    Thrombocytopenia ([place])    Trichimoniasis 10/30/2017   Upper GI bleed 02/10/2018   Urinary urgency 05/2020   UTI (urinary tract infection) 06/02/2017   Past Surgical History:  Past Surgical History:  Procedure Laterality Date   AMPUTATION Right 07/02/2022   Procedure: RIGHT BELOW KNEE AMPUTATION;  Surgeon: Newt Minion, MD;  Location: [place];  Service: Orthopedics;  Laterality: Right;   APPLICATION OF WOUND VAC Right 07/02/2022   Procedure: APPLICATION OF WOUND VAC;  Surgeon: Newt Minion, MD;  Location: [place];  Service: Orthopedics;  Laterality: Right;   BUNIONECTOMY     ESOPHAGEAL BANDING  06/26/2021   Procedure: ESOPHAGEAL BANDING;  Surgeon: Jackquline Denmark, MD;  Location[facility] ENDOSCOPY;  Service: Endoscopy;;   ESOPHAGOGASTRODUODENOSCOPY N/A 09/03/2017   Procedure: ESOPHAGOGASTRODUODENOSCOPY (EGD);  Surgeon: Doran Stabler, MD;  Location: [place];  Service: Gastroenterology;  Laterality: N/A;   ESOPHAGOGASTRODUODENOSCOPY N/A 02/01/2022   Procedure: ESOPHAGOGASTRODUODENOSCOPY (EGD);  Surgeon: Clarene Essex, MD;  Location: [facility];  Service: Gastroenterology;  Laterality: N/A;   ESOPHAGOGASTRODUODENOSCOPY (EGD) WITH PROPOFOL N/A 06/26/2021   Procedure: ESOPHAGOGASTRODUODENOSCOPY (EGD) WITH PROPOFOL;  Surgeon: Jackquline Denmark, MD;  [facility];  Service: Endoscopy;  Laterality: N/A;   FINGER FRACTURE SURGERY Left    "shattered my pinky"   FRACTURE SURGERY     I & D EXTREMITY Left 09/18/2018  Procedure: IRRIGATION AND DEBRIDEMENT EXTREMITY;  Surgeon: Leanora Cover, MD;  Location: [facility];  Service: Orthopedics;  Laterality: Left;   IR PARACENTESIS  07/23/2017   IR PARACENTESIS  07/2017   "did it twice in the same week" (09/01/2017)   IR PARACENTESIS  06/26/2021   SHOULDER OPEN ROTATOR CUFF REPAIR Right    TOOTH EXTRACTION  08/2019   TUBAL LIGATION     VAGINAL  HYSTERECTOMY     Social History:  reports that she has never smoked. She has never used smokeless tobacco. She reports current alcohol use of about 6.0 standard drinks of alcohol per week. She reports that she does not currently use drugs.  Family / Support Systems Marital Status: Divorced Patient Roles: Parent, Other (Comment) (neighbor and sibling) Children: Daughter in [place] has no car and son in [place] Other Supports: [place]  [place] [phone] Anticipated Caregiver: neighbors and her 60 and his wife Ability/Limitations of Caregiver: pt will only have intermittent assist Caregiver Availability: Intermittent Family Dynamics: See's her children every once in a while they are busy. Her sister is coming from [place] to see her today. She has neighbors who are supportive one is watching her dog and her preacher will check in on her  Social History Preferred language: English Religion: Baptist Cultural Background: No issues Education: [place] - How often do you need to have someone help you when you read instructions, pamphlets, or other written material from your doctor or pharmacy?: Never Writes: Yes Employment Status: Disabled Public relations account executive Issues: No issues Guardian/Conservator: None-according to MD pt is not fully capable of making her own decisions at this time, no formal POA is in place. Will look toward her children and sister who are her next of kin if any decisions come up while here   Abuse/Neglect Abuse/Neglect Assessment Can Be Completed: Yes Physical Abuse: Denies Verbal Abuse: Denies Sexual Abuse: Denies Exploitation of patient/patient's resources: Denies Self-Neglect: Denies  Patient response to: Social Isolation - How often do you feel lonely or isolated from those around you?: Rarely  Emotional Status Pt's affect, behavior and adjustment status: Pt is motivated to do well here and regain her  independence. She tried to save her leg and went to the ER multiple times for this. She has always been able to take care of herself and hopes to do so again. She has too she doesn't have someone to take care of her Recent Psychosocial Issues: other health issues Psychiatric History: History of depression/anxiety/schizophrenia tkaes medications and feels they help her but this has thrown her and she would benefit from seeing neuro-psych while here. Substance Abuse History: Hx-ETOH has advanced cirrhosis which has affected her health and memory. She reports she quit this due to her health issues  Patient / Family Perceptions, Expectations & Goals Pt/Family understanding of illness & functional limitations: Pt is able to explain her amputation and reports she tried to get her infection tken care of at the ER when she went multiple times before being admitted. She talks with the MD and feels she has a good understanding of her plan moving forward. Premorbid pt/family roles/activities: Mom, sister, neighbor, friend, church member, etc Anticipated changes in roles/activities/participation: resume Pt/family expectations/goals: Pt states: " I hope to be able to take care of myself I have too, I will have someone checking in on me but that is it."  [employer]: Other (Comment) ([employer] transportation) Premorbid Home Care/DME Agencies: Other (Comment) (reports has none) Transportation available  at discharge: [facility] Is the patient able to respond to transportation needs?: No In the past 12 months, has lack of transportation kept you from medical appointments or from getting medications?: No In the past 12 months, has lack of transportation kept you from meetings, work, or from getting things needed for daily living?: No Resource referrals recommended: Neuropsychology  Discharge Planning Living Arrangements: Alone Support Systems: Children, Other relatives, Friends/neighbors,  Social worker community Type of Residence: Private residence Insurance Resources: Medicaid (specify county) Museum/gallery curator Resources: SSI Financial Screen Referred: Previously completed Living Expenses: Education officer, community Management: Patient Does the patient have any problems obtaining your medications?: No Home Management: self Care Coordinator Barriers to Discharge: Decreased caregiver support, Lack of/limited family support, Insurance underwriter for SNF coverage Care Coordinator Anticipated Follow Up Needs: HH/OP, Support Group  Clinical Impression Pleasant motivated female who needs to be mod/I wheelchair level by discharge due to limited supports. Will be alone with others checking in on her. She has transport already for appointments. Will work on discharge needs and has neuro-psych to see while here.  Elease Hashimoto 07/08/2022, 11:00 AM

## 2022-07-08 NOTE — IPOC Note (Signed)
Overall Plan of Care [facility]) Patient Details Name: Briana Sweeney MRN: [id number] DOB: [birth year]  Admitting Diagnosis: Osteomyelitis of right foot [facility])  Hospital Problems: Principal Problem:   Osteomyelitis of right foot ([place])     Functional Problem List: Nursing Bladder, Bowel, Pain, Perception, Safety, Edema, Endurance, Medication Management, Skin Integrity, Motor  PT Balance, Endurance, Pain, Safety  OT Balance, Cognition, Edema, Endurance, Motor, Nutrition, Pain, Safety, Sensory, Skin Integrity  SLP Cognition  TR         Basic ADL's: OT Grooming, Bathing, Dressing, Toileting     Advanced  ADL's: OT       Transfers: PT Bed Mobility, Bed to Chair, Teacher, early years/pre, Tub/Shower     Locomotion: PT Ambulation, Wheelchair Mobility     Additional Impairments: OT Fuctional Use of Upper Extremity  SLP Social Cognition   Awareness, Attention, Memory, Problem Solving  TR      Anticipated Outcomes Item Anticipated Outcome  Self Feeding no goal  Swallowing      Basic self-care  MOD I  Toileting  MOD I   Bathroom Transfers MOD I  Bowel/Bladder  continent x 2  Transfers  Mod I  Locomotion  Mod I  Communication     Cognition  Mod-Min A  Pain  less than 4  Safety/Judgment  remain fall free while in rehab   Therapy Plan: PT Intensity: Minimum of 1-2 x/day ,45 to 90 minutes PT Frequency: 5 out of 7 days PT Duration Estimated Length of Stay: 10-14 days OT Intensity: Minimum of 1-2 x/day, 45 to 90 minutes OT Frequency: 5 out of 7 days OT Duration/Estimated Length of Stay: 10-14 days SLP Intensity: Minumum of 1-2 x/day, 30 to 90 minutes SLP Frequency: 3 to 5 out of 7 days SLP Duration/Estimated Length of Stay: 10-14 dats   Team Interventions: Nursing Interventions Patient/Family Education, Disease Management/Prevention, Skin Care/Wound Management, Discharge Planning, Bladder Management, Pain Management, Psychosocial Support, Bowel Management, Medication  Management  PT interventions Ambulation/gait training, Balance/vestibular training, Discharge planning, DME/adaptive equipment instruction, Functional mobility training, Pain management, Patient/family education, Skin care/wound management, Therapeutic Activities, Therapeutic Exercise, UE/LE Strength taining/ROM, UE/LE Coordination activities, Wheelchair propulsion/positioning  OT Interventions Training and development officer, DME/adaptive equipment instruction, Patient/family education, Therapeutic Activities, Wheelchair propulsion/positioning, Therapeutic Exercise, Psychosocial support, Cognitive remediation/compensation, Community reintegration, Functional mobility training, Self Care/advanced ADL retraining, UE/LE Strength taining/ROM, UE/LE Coordination activities, Skin care/wound managment, Neuromuscular re-education, Discharge planning, Disease mangement/prevention, Splinting/orthotics, Pain management, Visual/perceptual remediation/compensation  SLP Interventions Cognitive remediation/compensation, Internal/external aids, Medication managment, Cueing hierarchy, Functional tasks, Patient/family education  TR Interventions    SW/CM Interventions Discharge Planning, Psychosocial Support, Patient/Family Education   Barriers to Discharge MD  Medical stability, Home enviroment access/loayout, Incontinence, Wound care, Lack of/limited family support, Weight bearing restrictions, and Medication compliance  Nursing Home environment access/layout, Incontinence, Wound Care, Lack of/limited family support, Weight, Weight bearing restrictions, Medication compliance going home alone; 1 level apt with elevator  PT Lack of/limited family support, Weight bearing restrictions    OT Decreased caregiver support, Lack of/limited family support, Weight bearing restrictions    SLP      SW Decreased caregiver support, Lack of/limited family support, Insurance underwriter for SNF coverage     Team Discharge  Planning: Destination: PT-Home ,OT- Home , SLP-Home Projected Follow-up: PT-Home health PT, OT-  Home health OT, SLP-24 hour supervision/assistance, Outpatient SLP, Home Health SLP Projected Equipment Needs: PT-To be determined, OT- 3 in 1 bedside comode, Tub/shower bench, SLP-None recommended by SLP Equipment Details: PT- , OT-DAC?  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 The team will be addressing functional mobility, strength, stamina, balance, safety, adaptive techniques and equipment, self-care, bowel and bladder mgt, patient and caregiver education, preprosthetic training. Goals have been set at mod I with min A for cognition. Anticipated discharge destination is home.        See Team Conference Notes for weekly updates to the plan of care

## 2022-07-08 NOTE — Progress Notes (Signed)
Speech Language Pathology Daily Session Note  Patient Details  Name: Briana Sweeney MRN: [id number] Date of Birth: 1971/06/18  Today's Date: 07/08/2022 SLP Individual Time: 7001-7494 SLP Individual Time Calculation (min): 45 min  Short Term Goals: Week 1: SLP Short Term Goal 1 (Week 1): Pt will demonstrate sustained attention in 5-7 minute intervals with min A verbal cues for redirection. SLP Short Term Goal 2 (Week 1): Pt will demonstrate recall with external aids of daily events and information with mod A verbal cues. SLP Short Term Goal 3 (Week 1): Pt will demonstrate basic-mildly complex problem solving skills with mod A verbal cues. SLP Short Term Goal 4 (Week 1): Pt will demonstrate ability to correct errors in functional tasks with max A verbal cues.  Skilled Therapeutic Interventions:Skilled ST services focused on cognitive skills. SLP initiated memory notebook and pt demonstrated ability to recall OT AM events with min A verbal cues for visual aid schedule to recall therapist name and time. SLP educate pt on WRAP strategies to aid in recall, Pt required max A verbal cues to utilize strategies in ALFA money management task. Pt required max A verbal cues for problem solving and error awareness reduced to mod A verbal cues with use of written aids for recall which impacted problem solving and error awareness. Pt required mod A verbal cues to recall current ST events to record in notebook. Pt was left with chair alarm set and call bell within reach. Recommend to continue ST services.     Pain Pain Assessment Pain Score: 3   Therapy/Group: Individual Therapy  Theodore Rahrig  [facility] 07/08/2022, 9:31 AM

## 2022-07-08 NOTE — Progress Notes (Signed)
Physical Therapy Session Note  Patient Details  Name: Briana Sweeney MRN: [id number] Date of Birth: 03-30-1971  Today's Date: 07/08/2022 PT Individual Time: 1301-1341 PT Individual Time Calculation (min): 40 min  and Today's Date: 07/08/2022 PT Missed Time: 35 Minutes Missed Time Reason: Patient unwilling to participate;Other (Comment) (declined due to family visit)  Short Term Goals: Week 1:  PT Short Term Goal 1 (Week 1): STG = LTG 2/2 LOS  Skilled Therapeutic Interventions/Progress Updates:      Therapy Documentation Precautions:  Precautions Precautions: Fall Precaution Comments: Reviewed verbally Restrictions Weight Bearing Restrictions: Yes RLE Weight Bearing: Non weight bearing  Pt received semi-reclined in bed with unrated phantom limb pain. Pt agreeable to participate in partial session due to sister visiting. PT educated pt that her sister could watch and participate in session and pt adamant to return to her room upon her sister's arrival. Pt (S) with bed mobility with bed features and min/mod A for stand pivot transfer to w/c with verbal and visual cues for sequencing and hand placement. Pt required increased time to propel w/c ~250 ft to dayroom with supervision for safety. PT educated pt on phantom limb pain treatments including densensitization techniques and mirror therapy. PT will continue to incorporate techniques in future session as pt terminated session early to visit family. Pt transported to room and left seated in w/c at bedside and declines bed alarm.    Therapy/Group: Individual Therapy  Verl Dicker Verl Dicker PT, DPT  07/08/2022, 3:52 PM

## 2022-07-08 NOTE — Progress Notes (Signed)
Occupational Therapy Session Note  Patient Details  Name: Briana Sweeney MRN: [id number] Date of Birth: 08-05-1971  Today's Date: 07/08/2022 OT Individual Time: 0805-0900 OT Individual Time Calculation (min): 55 min    Short Term Goals: Week 1:  OT Short Term Goal 1 (Week 1): Pt will complete toilet transfers with supervision OT Short Term Goal 2 (Week 1): Pt will complete 2/3 components of toileting with supervision OT Short Term Goal 3 (Week 1): Pt will don LB clothing with CGA at sit to stand level OT Short Term Goal 4 (Week 1): Pt will initate IADL retraining in prep for living alone  Skilled Therapeutic Interventions/Progress Updates:    Patient agreeable to participate in OT session. Reports 7/10 pain level in right residual limb. Provided pain medication prior therapy.   Patient participated in skilled OT session focusing on ADL re-training and functional transfers. Therapist facilitated session to allow patient to complete ADL at highest level of independence while maintaining WB status during bathing and dressing at w/c level in order to improve overall functional performance during self care tasks. Pt completed functional transfer from bed to w/c with CGA using RW although technique unsafe due to lack of LLE for balance. Grooming, and UB dressing  completed while seated at SBA. LB bathing and dressing completed at w/c level with Min A. Pt able to use sink/counter to stabilize while standing in order to pull pants up over hips. Min A provided for balance and safety. Tub/shower transfer completed with tub bench. OT provided patient with education and VC for safe technique and form such as right on placed on walker and left hand pushes up from w/c; Stand in one spot while utilizing BUE to turn in a 360 degree circle until able to take small hops back towards bench. Min A provided for tub transfer. Light physical assist provided for RW management.     Therapy  Documentation Precautions:  Precautions Precautions: Fall Precaution Comments: Reviewed verbally Restrictions Weight Bearing Restrictions: Yes RLE Weight Bearing: Non weight bearing   Therapy/Group: Individual Therapy  Ailene Ravel, OTR/L,CBIS  Supplemental OT - [place] and [facility]  07/08/2022, 7:48 AM

## 2022-07-08 NOTE — Progress Notes (Signed)
[facility] Individual Statement of Services  Patient Name:  Briana Sweeney  Date:  07/08/2022  Welcome to the [facility].  Our goal is to provide you with an individualized program based on your diagnosis and situation, designed to meet your specific needs.  With this comprehensive rehabilitation program, you will be expected to participate in at least 3 hours of rehabilitation therapies Monday-Friday, with modified therapy programming on the weekends.  Your rehabilitation program will include the following services:  Physical Therapy (PT), Occupational Therapy (OT), Speech Therapy (ST), 24 hour per day rehabilitation nursing, Therapeutic Recreaction (TR), Neuropsychology, Care Coordinator, Rehabilitation Medicine, Nutrition Services, and Pharmacy Services  Weekly team conferences will be held on Tuesday to discuss your progress.  Your Inpatient Rehabilitation Care Coordinator will talk with you frequently to get your input and to update you on team discussions.  Team conferences with you and your family in attendance may also be held.  Expected length of stay: 10-14 days  Overall anticipated outcome: independent-supervision level  Depending on your progress and recovery, your program may change. Your Inpatient Rehabilitation Care Coordinator will coordinate services and will keep you informed of any changes. Your Inpatient Rehabilitation Care Coordinator's name and contact numbers are listed  below.  The following services may also be recommended but are not provided by the [facility]:   [place] will be made to provide these services after discharge if needed.  Arrangements include referral to agencies that provide these services.  Your insurance has been verified to be:  Medicaid [employer] primary doctor is:  Marco Collie  Pertinent  information will be shared with your doctor and your insurance company.  Inpatient Rehabilitation Care Coordinator:  Ovidio Kin, [place] or Emilia Beck  Information discussed with and copy given to patient by: Elease Hashimoto, 07/08/2022, 11:03 AM

## 2022-07-09 ENCOUNTER — Inpatient Hospital Stay (HOSPITAL_COMMUNITY): Payer: No Typology Code available for payment source

## 2022-07-09 DIAGNOSIS — M869 Osteomyelitis, unspecified: Secondary | ICD-10-CM | POA: Diagnosis not present

## 2022-07-09 DIAGNOSIS — M7989 Other specified soft tissue disorders: Secondary | ICD-10-CM

## 2022-07-09 LAB — CK: Total CK: 29 U/L — ABNORMAL LOW (ref 38–234)

## 2022-07-09 MED ORDER — GABAPENTIN 300 MG PO CAPS
300.0000 mg | ORAL_CAPSULE | Freq: Three times a day (TID) | ORAL | Status: DC
  Administered 2022-07-09 – 2022-07-12 (×11): 300 mg via ORAL
  Filled 2022-07-09 (×11): qty 1

## 2022-07-09 MED ORDER — HYDROCORTISONE ACETATE 25 MG RE SUPP
25.0000 mg | Freq: Two times a day (BID) | RECTAL | Status: DC
  Administered 2022-07-09 – 2022-07-13 (×4): 25 mg via RECTAL
  Filled 2022-07-09 (×19): qty 1

## 2022-07-09 MED ORDER — BENZONATATE 100 MG PO CAPS
100.0000 mg | ORAL_CAPSULE | Freq: Three times a day (TID) | ORAL | Status: DC | PRN
  Filled 2022-07-09: qty 1

## 2022-07-09 NOTE — Progress Notes (Signed)
Patient sleeping at this time. Notified nurse to consult IV team with any concerns or questions. Fran Lowes, RN VAST

## 2022-07-09 NOTE — Progress Notes (Signed)
Occupational Therapy Session Note  Patient Details  Name: Briana Sweeney MRN: [id number] Date of Birth: Apr 08, 1971  Today's Date: 07/09/2022 OT Individual Time: 8756-4332 OT Individual Time Calculation (min): 45 min    Short Term Goals: Week 1:  OT Short Term Goal 1 (Week 1): Pt will complete toilet transfers with supervision OT Short Term Goal 2 (Week 1): Pt will complete 2/3 components of toileting with supervision OT Short Term Goal 3 (Week 1): Pt will don LB clothing with CGA at sit to stand level OT Short Term Goal 4 (Week 1): Pt will initate IADL retraining in prep for living alone  Skilled Therapeutic Interventions/Progress Updates:   Pt seen for skilled OT services with focus on self care, functional transfers, adaptive methods for managing R LE residual limb and discharge planning/readiness with Team Conference discussion for later am. Pt moved supine to EOB with close S. Reported 6/10 R LE pain with new meds given for phantom limb pain. OT managed wound vac which is said to be d/c'd by tomorrow. Pt able to perform lateral squat pivot from bed to w/c to L side with CGA, then SPT with RW to and from toilet with mod fading to min A depending on side. Notified RN of blood in stool after BM and voiding. Close S for toilet hygiene. Pt self propelled w/c with S to sink side for UB bathing, dressing and grooming with set up and LB bathing and dressing with mod fading to min a mostly due to wound vac tubing and balance for garment management in standing. NT arrived to room for vital signs and OT left in their care.   Therapy Documentation Precautions:  Precautions Precautions: Fall Precaution Comments: Reviewed verbally Restrictions Weight Bearing Restrictions: Yes RLE Weight Bearing: Non weight bearing    Therapy/Group: Individual Therapy  Barnabas Lister 07/09/2022, 7:53 AM

## 2022-07-09 NOTE — Progress Notes (Signed)
Patient ID: Briana Sweeney, female   DOB: 03-15-1971, 51 y.o.   MRN: [id number]  Met with pt and sister via telephone along with preacher and his wife who came in and pt gave worker permission to speak in front of them. Gave all team conference update regarding goals of mod/I wheelchair level and target discharge date of 11/13. Sister reports she was in charge of her medications and finances. She feels her memory is a little worse then before surgery now. Hopefull this will get better while she is here and working with speech therapy on compensation strategies. Sister reports the preacher and his wife live close by and check on her and make sure she is doing ok. Will continue to work on discharge needs.

## 2022-07-09 NOTE — Patient Care Conference (Signed)
Inpatient RehabilitationTeam Conference and Plan of Care Update Date: 07/09/2022   Time: 11:31 AM    Patient Name: Briana Sweeney      Medical Record Number: [id number]  Date of Birth: March 29, 1971 Sex: Female         Room/Bed: 4M05C/4M05C-01 Payor Info: Payor: MEDICAID [place] / Plan: MEDICAID [place] ACCESS / Product Type: *No Product type* /    Admit Date/Time:  07/05/2022  1:51 PM  Primary Diagnosis:  Osteomyelitis of right foot [facility])  Hospital Problems: Principal Problem:   Osteomyelitis of right foot [facility])    Expected Discharge Date: Expected Discharge Date: 07/22/22  Team Members Present: Physician leading conference: Dr. Courtney Heys Social Worker Present: Ovidio Kin, LCSW Nurse Present: Other (comment) Tacy Learn, RN) PT Present: Other (comment) Verl Dicker, PT) OT Present: Jamey Ripa, OT SLP Present: Helaine Chess, SLP PPS Coordinator present : Gunnar Fusi, SLP     Current Status/Progress Goal Weekly Team Focus  Bowel/Bladder   Patient continent of b/b. lbm 10/30  remain continent of b/b  Assist with toileting qshift and prn   Swallow/Nutrition/ Hydration             ADL's   Min guard sqt pvt transfer, Min A with RW tub transfer, SBA: grooming, UB bathing/dressing, Min A: LB bathing/dressing.  SBA-Mod I  technique during functional transfers, ADL re-training, standing balance. compensatory techniques during self care tasks.   Mobility   (S) bed mobility, min/CGA squat pivot, CGA stand pivot, w/c 250 ft (S)  Mod I  phantom limb pain mgmt, transfers, w/c mobility, pt/fam education   Communication             Safety/Cognition/ Behavioral Observations  max-mod A, memory and attention main deficit impacting problem solving and error awareness (baseline memory deficits but exasberated)  Mod-Min A  memory notebook, sustained attention, basic-mildly comples problem solving and correct errors   Pain   pt c/o right leg surgical pain from bka. 9 out of  10. prn diladud  Pain score <5  Assess pain qhshift and prn   Skin   pt has a wound vac to the right bka. no drainage.  Proper healing from the wound vac.  Assess incision and wound vac qshift and prn     Discharge Planning:  HOme alone with intermtent assist from neighbor/friends and preacher. Will need to be mod/i wheelchair levle to be safe home alone   Team Discussion: Right BKA. Continent B/B. Pain managed with PRN medications. Medial fissure to coccyx. Wound vac has no drainage. PICC will be removed prior to discharge after last dose IV ABT. ID will follow for ABT tx. Refusing Lovenox d/t bleeding. Gabapentin added for additional pain control. Tessalon pearls also added for cough. Patient tolerating therapy. Memory journal started 07/08/22 due to poor recall.  Patient on target to meet rehab goals: yes, 250 ft w/c propulsion   *See Care Plan and progress notes for long and short-term goals.   Revisions to Treatment Plan:  Remove wound vac, medication adjustments, memory book  Teaching Needs: Medications, safety, transfer training, wound/skin care, etc.  Current Barriers to Discharge: Decreased caregiver support, Medical stability, Home enviroment access/layout, IV antibiotics, Wound care, Lack of/limited family support, Weight, Weight bearing restrictions, Medication compliance, and Behavior  Possible Resolutions to Barriers: Family/neighbor/friend education, memory journal use, order recommended DME     Medical Summary Current Status: contientn B/B- having BM's 4-7/day- for her cirrhosis; medial fissure coccyx- has PICC- has to be removed before  she leaves-  Barriers to Discharge: Decreased family/caregiver support;Home enviroment access/layout;Medical stability;Wound care;Weight bearing restrictions;Weight;Other (comments)  Barriers to Discharge Comments: will only have intermittent assistance- cirrhosis and phantom pain and anal/rectal pain limting as well as poor memory, likely  due to end stage cirrhosis- how to send her home with severe memory deficits? Possible Resolutions to [employer]: has done mirror therapy- doesn't remember was done and desesnitization treatments-VAC off today- phantom pain is her pain missue- and IV ABX- which wil stop upon d/c and switch to PO- mod-max A memory and attention; baseline memory deficits-max mgmt money tasks- doesn't want to go to sister's/[place]- has medicaid- d/c 11/13- 12/30 on [facility]   Continued Need for Acute Rehabilitation Level of Care: The patient requires daily medical management by a physician with specialized training in physical medicine and rehabilitation for the following reasons: Direction of a multidisciplinary physical rehabilitation program to maximize functional independence : Yes Medical management of patient stability for increased activity during participation in an intensive rehabilitation regime.: Yes Analysis of laboratory values and/or radiology reports with any subsequent need for medication adjustment and/or medical intervention. : Yes   I attest that I was present, lead the team conference, and concur with the assessment and plan of the team.   Ernest Pine 07/09/2022, 4:21 PM

## 2022-07-09 NOTE — Discharge Instructions (Addendum)
Inpatient Rehab Discharge Instructions  Laverna Dossett Discharge date and time:  07/22/2022  Activities/Precautions/ Functional Status: Activity: no lifting, driving, or strenuous exercise until cleared by MD Diet:  low sodium Wound Care: keep wound clean and dry Functional status:  ___ No restrictions     ___ Walk up steps independently __x_ 24/7 supervision/assistance   ___ Walk up steps with assistance ___ Intermittent supervision/assistance  ___ Bathe/dress independently ___ Walk with walker     ___ Bathe/dress with assistance ___ Walk Independently    ___ Shower independently ___ Walk with assistance    __x_ Shower with assistance __x_ No alcohol     ___ Return to work/school ________  Special Instructions: No driving, alcohol consumption or tobacco use.   COMMUNITY REFERRALS UPON DISCHARGE:    Home Health:   PT   OT  RN                Agency: [place] Phone:  [phone]   Medical Equipment/Items Ordered: WHEELCHAIR, 3 IN 1 AND TUB BENCH, Vassie Moselle                                                 Agency/Supplier:[facility]   [phone]    My questions have been answered and I understand these instructions. I will adhere to these goals and the provided educational materials after my discharge from the hospital.  Patient/Caregiver Signature _______________________________ Date __________  Clinician Signature _______________________________________ Date __________  Please bring this form and your medication list with you to all your follow-up doctor's appointments.

## 2022-07-09 NOTE — Progress Notes (Signed)
Left lower extremity venous duplex has been completed. Preliminary results can be found in CV Proc through chart review.   07/09/22 2:15 PM Briana Sweeney RVT

## 2022-07-09 NOTE — Progress Notes (Signed)
Physical Therapy Session Note  Patient Details  Name: Briana Sweeney MRN: [id number] Date of Birth: 1971-03-10  Today's Date: 07/09/2022 PT Individual Time: 3149-7026, 3785-8850 PT Individual Time Calculation (min): 42 min & 71 min   Short Term Goals: Week 1:  PT Short Term Goal 1 (Week 1): STG = LTG 2/2 LOS  Skilled Therapeutic Interventions/Progress Updates:      Therapy Documentation Precautions:  Precautions Precautions: Fall Precaution Comments: Reviewed verbally Restrictions Weight Bearing Restrictions: Yes RLE Weight Bearing: Non weight bearing  Treatment Session 1:  Pt received semi-reclined in bed, agreeable to PT session. Pt reports right residual limb and phantom limb pain of 9/10. PT educated and instructed pt on desensitization techniques via rubbing washcloth on residual limb. Pt reports improvement in pain with technique to 7/10. Pt requires supervision for safety for bed mobility with bed features and for dynamic sitting balance edge of bed while performing BUE reaching tasks. Pt presents with posterior and lateral losses of balance in sitting and demonstrates ability to safely self-correct with increased time. Pt performed right residual knee extension 3 x 10 and educated on benefit of exercise for shaping and preparing for prosthesis. Pt requires verbal cueing for AD management and hand placement with stand pivot transfers from w/c<>bed x 6 CGA for balance/safety. Pt left semi-reclined in bed with all needs in reach and bed alarm on. PT updated memory journal.    Treatment Session 2:  Pt received semi-reclined in bed, agreeable to PT session with emphasis on phantom limb pain management and strengthening. Initially pt reports 9/10 pain that decreased with activity to 7/10. Nursing notified and provided patient with medication for pain at end of session. Pt requests need to void and requires supervision for safety with bed mobility and min A for squat pivot transfer  to w/c. Pt voided and required mod A for toilet transfer. Pt propelled w/c ~150 ft and requires min A for obstacle negotiation and ramp navigation. Pt transported to main gym for time management and energy conservation. Pt participated in mirror therapy to address phantom limb pain and following intervention reports no change in pain. In mirror pt performed 30 reps of L LE ankle pumps, straight leg raises, and heel slides. Pt transitioned to prone for hip flexor stretch and performed modified push up 3 x 5 with verbal cues for form. In side lying pt performed left hip abduction 3 x 10 and left hip extension 1 x 10 with tactile cues for form. At end of last rep pt reported increase in pain and performed desensitization technique. Pt returned to room by w/c and left semi-reclined in bed with all needs in reach and bed alarm on.    Therapy/Group: Individual Therapy  Verl Dicker Verl Dicker PT, DPT  07/09/2022, 7:45 AM

## 2022-07-09 NOTE — Discharge Summary (Signed)
Physician Discharge Summary  Patient ID: Enna Warwick MRN: [id number] DOB/AGE: 16-Jan-1971 51 y.o.  Admit date: 07/05/2022 Discharge date: 07/22/2022  Discharge Diagnoses:  Principal Problem:   Osteomyelitis of right foot ([place]) Active Problems:   Hx of BKA, right ([place]) Active problems: Debility secondary to right below the knee amputation MRSA bacteremia Alcoholic cirrhosis Chronic kidney disease stage III Thrombocytopenia Sacral decubitus ulcer  Discharged Condition: stable  Significant Diagnostic Studies: VAS [place] UPPER EXTREMITY VENOUS DUPLEX  Result Date: 07/07/2022 UPPER VENOUS STUDY  Patient Name:  CHRISTYNA LETENDRE  Date of Exam:   07/06/2022 Medical Rec #: [id number]          Accession #:    [id number] Date of Birth: 02-06-1971          Patient Gender: F Patient Age:   51 years Exam Location:  [facility] Procedure:      VAS [place] UPPER EXTREMITY VENOUS DUPLEX Referring Phys: Durel Salts --------------------------------------------------------------------------------  Indications: Pain, and Edema Comparison Study: Prior negative left UEV done 09/13/18 Performing Technologist: Sharion Dove RVS  Examination Guidelines: A complete evaluation includes B-mode imaging, spectral Doppler, color Doppler, and power Doppler as needed of all accessible portions of each vessel. Bilateral testing is considered an integral part of a complete examination. Limited examinations for reoccurring indications may be performed as noted.  Right Findings: +----------+------------+---------+-----------+----------+-------+ RIGHT     CompressiblePhasicitySpontaneousPropertiesSummary +----------+------------+---------+-----------+----------+-------+ Subclavian               Yes       Yes                      +----------+------------+---------+-----------+----------+-------+  Left Findings: +----------+------------+---------+-----------+----------+-------+ LEFT       CompressiblePhasicitySpontaneousPropertiesSummary +----------+------------+---------+-----------+----------+-------+ IJV           Full       Yes       Yes                      +----------+------------+---------+-----------+----------+-------+ Subclavian    Full       Yes       Yes                      +----------+------------+---------+-----------+----------+-------+ Axillary      Full       Yes       Yes                      +----------+------------+---------+-----------+----------+-------+ Brachial      Full                                          +----------+------------+---------+-----------+----------+-------+ Radial        Full                                          +----------+------------+---------+-----------+----------+-------+ Ulnar         Full                                          +----------+------------+---------+-----------+----------+-------+ Cephalic      None  Acute  +----------+------------+---------+-----------+----------+-------+ Basilic       Full                                          +----------+------------+---------+-----------+----------+-------+ Acute SVT of the cephalic vein mid to proximal forearm, at site of IV. Unable to visualized lower forearm secondary to bandages/IV  Summary:  Right: No evidence of thrombosis in the subclavian.  Left: No evidence of deep vein thrombosis in the upper extremity. Findings consistent with acute superficial vein thrombosis involving the left cephalic vein.  *See table(s) above for measurements and observations.  Diagnosing physician: Jamelle Haring Electronically signed by Jamelle Haring on 07/07/2022 at 2:44:26 PM.    Final    [place] EKG SITE RITE  Result Date: 07/05/2022 If Site Rite image not attached, placement could not be confirmed due to current cardiac rhythm.  [place] EKG SITE RITE  Result Date: 07/05/2022 If Site Rite image not attached,  placement could not be confirmed due to current cardiac rhythm.   Labs:  Basic Metabolic Panel: Recent Labs  Lab 07/17/22 0255 07/22/22 0404  NA 133* 130*  K 4.1 3.6  CL 99 97*  CO2 23 26  GLUCOSE 108* 92  BUN 9 5*  CREATININE 0.50 0.52  CALCIUM 8.2* 7.7*    CBC: Recent Labs  Lab 07/17/22 0255 07/19/22 0429 07/22/22 0404  WBC 4.8 3.4* 3.3*  NEUTROABS 2.5 1.4*  --   HGB 10.8* 11.7* 11.8*  HCT 32.8* 37.0 36.2  MCV 91.1 92.0 91.0  PLT 76* 64* 49*    CBG: No results for input(s): "GLUCAP" in the last 168 hours.   Brief HPI:   Sameen Leas is a 51 y.o. female who presented to the emergency department complaining of swelling and redness of her right foot on 06/29/2022.  She was started on antibiotics.  Imaging confirmed osteomyelitis of the right foot with tenosynovitis and abscesses.  Biotics adjusted.  Surgery consultation obtained and she underwent right below the knee amputation by Dr. Sharol Given on 10/24.  ID consultation obtained and antibiotics for 4 weeks recommended.  Medical history significant for alcoholic cirrhosis with recurrent ascites, esophageal varices, hepatitis C and seizure disorder.   Hospital Course: Kamaree Berkel was admitted to rehab 07/05/2022 for inpatient therapies to consist of PT, ST and OT at least three hours five days a week. Past admission physiatrist, therapy team and rehab RN have worked together to provide customized collaborative inpatient rehab. PICC line placed day of admission. LUE venous duplex obtained with evidence of left cephalic vein superficial venous thrombosis. Serum creatinine at baseline on follow-up labs. Patient asked for Lovenox to be held due to anal bleeding on 10/29. Gabapentin and mirror therapy added for phantom pain on 10/30. Discussed need for Lovenox for DVT prophylaxis and continued. Patient refused multiple Lovenox doses therefore bilateral LE venous duplex obtained 10/31>>negative for DVT. VAC dressing removed  10/31. Tiny sacral decubitus ulcer noted 11/1 and gabapentin increased to 300 mg BID. Paracentesis 11/1 of 800 cc. Had spontaneous nosebleed on 11/8 and treated with Afrin. She recevied final dose of daptomycin IV on 11/13. PICC removed. She got OOB without assistance on 11/13 and her limb protector was not on properly and fell off. She bumped incision and had some mild thin blood oozing from lateral incision. Compression dressing applied. Explained importance of monitoring incision and to make follow-up appointment with  Dr. Sharol Given. Also reinforced need to follow-up at infusion center on 11/16 for platelet infusion as scheduled.   Blood pressures were monitored on TID basis and remained stable.   Rehab course: During patient's stay in rehab weekly team conferences were held to monitor patient's progress, set goals and discuss barriers to discharge. At admission, patient required supervision/verbal cuing to mod assist for basic self-care skills, CGA to total assist for mobility.  She has had improvement in activity tolerance, balance, postural control as well as ability to compensate for deficits. She has had improvement in functional use RUE/LUE  and LLE as well as improvement in awareness    Disposition: Discharge disposition: 01-Home or Self Care     Diet: Regular  Special Instructions: No driving, alcohol consumption or tobacco use.   30-35 minutes were spent on discharge planning and discharge summary.  Discharge Instructions     Ambulatory referral to Physical Medicine Rehab   Complete by: As directed    Hospital follow-up   Discharge patient   Complete by: As directed    Discharge disposition: 01-Home or Self Care   Discharge patient date: 07/22/2022      Allergies as of 07/22/2022       Reactions   Other Other (See Comments)   Platelets: Rx chest pain, tremors, body aches Patient stated that she receives platelet injection every Wednesday         Medication List      STOP taking these medications    thiamine 100 MG tablet Commonly known as: VITAMIN B1 Replaced by: thiamine 100 MG tablet       TAKE these medications    acamprosate 333 MG tablet Commonly known as: CAMPRAL Take 2 tablets (666 mg total) by mouth 3 (three) times daily   acetaminophen 325 MG tablet Commonly known as: TYLENOL Take 1-2 tablets (325-650 mg total) by mouth every 6 (six) hours as needed for mild pain.   ARIPiprazole 10 MG tablet Commonly known as: ABILIFY Take 1 tablet (10 mg total) by mouth daily. Start taking on: July 23, 2022 What changed:  how much to take how to take this when to take this   ascorbic acid 1000 MG tablet Commonly known as: VITAMIN C Take 1 tablet (1,000 mg total) by mouth daily. Start taking on: July 23, 2022   clonazePAM 0.5 MG tablet Commonly known as: KLONOPIN Take 1 tablet (0.5 mg total) by mouth 2 (two) times daily as needed (anxiety). What changed:  how much to take how to take this when to take this reasons to take this   DULoxetine 60 MG capsule Commonly known as: CYMBALTA Take 1 capsule (60 mg total) by mouth at bedtime.   FLUoxetine 20 MG capsule Commonly known as: PROZAC Take 1 capsule (20 mg total) by mouth daily. Start taking on: July 23, 2022 What changed:  medication strength how much to take   folic acid 1 MG tablet Commonly known as: FOLVITE Take 1 tablet (1 mg total) by mouth daily. Start taking on: July 23, 2022   furosemide 40 MG tablet Commonly known as: LASIX Take 1 tablet (40 mg total) by mouth daily.   gabapentin 300 MG capsule Commonly known as: NEURONTIN Take 2 capsules (600 mg total) by mouth 3 (three) times daily.   hydrocortisone 2.5 % rectal cream Commonly known as: ANUSOL-HC Place rectally 2 (two) times daily as needed for hemorrhoids or anal itching.   HYDROmorphone 4 MG tablet Commonly known as: DILAUDID Take 1  tablet (4 mg total) by mouth 2 (two) times daily as  needed for severe pain.   lactulose 10 GM/15ML solution Commonly known as: CHRONULAC Take 30 mLs (20 g total) by mouth 2 (two) times daily. What changed: when to take this   levETIRAcetam 750 MG tablet Commonly known as: KEPPRA Take 1 tablet (750 mg total) by mouth 2 (two) times daily.   multivitamin with minerals Tabs tablet Take 1 tablet by mouth daily. Start taking on: July 23, 2022   nadolol 20 MG tablet Commonly known as: Corgard Take 1 tablet (20 mg total) by mouth every morning. Start taking on: July 23, 2022   nutrition supplement (JUVEN) Pack Take 1 packet by mouth 2 (two) times daily between meals.   pantoprazole 40 MG tablet Commonly known as: PROTONIX Take 1 tablet (40 mg total) by mouth daily.   spironolactone 100 MG tablet Commonly known as: ALDACTONE Take 1 tablet (100 mg total) by mouth daily. Start taking on: July 23, 2022 What changed: medication strength   thiamine 100 MG tablet Commonly known as: Vitamin B-1 Take 1 tablet (100 mg total) by mouth daily. Start taking on: July 23, 2022 Replaces: thiamine 100 MG tablet   traZODone 50 MG tablet Commonly known as: DESYREL Take 0.5-1 tablets (25-50 mg total) by mouth at bedtime as needed for sleep.        Follow-up Information     Lovorn, Jinny Blossom, MD Follow up.   Specialty: Physical Medicine and Rehabilitation Why: office will call you to arrange your appt (sent) Contact information: [address] [place] [address] [phone]         Marco Collie, MD Follow up.   Specialty: Family Medicine Why: Call in 1-2 days to make arrangements for hospital follow-up appointment Contact information: [place] [address] [phone]         Newt Minion, MD Follow up.   Specialty: Orthopedic Surgery Why: Call in 1-2 days to make arrangements for hospital follow-up appointment Contact information: [place] [address] [phone]          Mignon Pine, DO. Go to.   Specialties: Infectious Diseases, Internal Medicine Why: 07/29/2022 Contact information: [address] [place] [address] [phone]                 Signed: Barbie Banner 07/22/2022, 2:22 PM

## 2022-07-09 NOTE — Progress Notes (Signed)
PROGRESS NOTE   Subjective/Complaints:  Pt reports pain is real bothersome for her- isn't on any nerve pain meds- and main issue is phantom pain- will add gabapentin Has 4-7 Bms/day-  Due to cirrhosis.  Wiping anus a lot due to frequency of bowel movements Thinks causing hemorrhoids to act up.  Taking dilaudid ~ q5 hours- throughout.     ROS:  Pt denies SOB, abd pain, CP, N/V/C/D, and vision changes Except per HPI Objective:   No results found. Recent Labs    07/08/22 0419  WBC 8.8  HGB 10.9*  HCT 33.1*  PLT 163   Recent Labs    07/08/22 0419  NA 134*  K 3.4*  CL 100  CO2 27  GLUCOSE 129*  BUN 10  CREATININE 0.51  CALCIUM 8.3*    Intake/Output Summary (Last 24 hours) at 07/09/2022 0814 Last data filed at 07/09/2022 0754 Gross per 24 hour  Intake 1316 ml  Output --  Net 1316 ml        Physical Exam: Vital Signs Blood pressure 105/79, pulse (!) 102, temperature 98.9 F (37.2 C), resp. rate 16, height '4\' 11"'$  (1.499 m), weight 69.8 kg, SpO2 90 %.   General: awake, alert, appropriate, sitting EOB; still wearing make up from yesterday. 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 No joint swelling RIght BKA wound vac , cannot visualize incision   Assessment/Plan: 1. Functional deficits which require 3+ hours per day of interdisciplinary therapy in a comprehensive  inpatient rehab setting. Physiatrist is providing close team supervision and 24 hour management of active medical problems listed below. Physiatrist and rehab team continue to assess barriers to discharge/monitor patient progress toward functional and medical goals  Care Tool:  Bathing    Body parts bathed by patient: Right arm, Left arm, Chest, Abdomen, Front perineal area, Right upper leg, Left upper leg, Left lower leg, Face   Body parts bathed by helper: Buttocks Body parts n/a: Right lower leg   Bathing assist Assist Level: Minimal Assistance - Patient > 75%     Upper Body Dressing/Undressing Upper body dressing   What is the patient wearing?: Pull over shirt, Bra    Upper body assist Assist Level: Supervision/Verbal cueing    Lower Body Dressing/Undressing Lower body dressing      What is the patient wearing?: Underwear/pull up, Pants     Lower body assist Assist for lower body dressing: Moderate Assistance - Patient 50 - 74%     Toileting Toileting    Toileting assist Assist for toileting: Moderate Assistance - Patient 50 - 74%     Transfers Chair/bed transfer  Transfers assist     Chair/bed transfer assist level:  Moderate Assistance - Patient 50 - 74%     Locomotion Ambulation   Ambulation assist   Ambulation activity did not occur: Safety/medical concerns (2/2 wound vac on R BKA)          Walk 10 feet activity   Assist  Walk 10 feet activity did not occur: Safety/medical concerns (2/2 wound vac on R BKA)        Walk 50 feet activity   Assist Walk 50 feet with 2 turns activity did not occur: Safety/medical concerns (2/2 wound vac on R BKA)         Walk 150 feet activity   Assist Walk 150 feet activity did not occur: Safety/medical concerns (2/2 wound vac on R BKA)         Walk 10 feet on uneven surface  activity   Assist Walk 10 feet on uneven surfaces activity did not occur: Safety/medical concerns (2/2 wound vac on R  BKA)         Wheelchair     Assist Is the patient using a wheelchair?: Yes Type of Wheelchair: Manual    Wheelchair assist level: Minimal Assistance - Patient > 75%      Wheelchair 50 feet with 2 turns activity    Assist            Wheelchair 150 feet activity     Assist          Blood pressure 105/79, pulse (!) 102, temperature 98.9 F (37.2 C), resp. rate 16, height '4\' 11"'$  (1.499 m), weight 69.8 kg, SpO2 90 %.  Medical Problem List and Plan: 1. Functional deficits secondary to right BKA             -patient may shower with tubing clamped, vac disconnected. Do not submerge.              -ELOS/Goals: 10-14 days             - Lives alone in handicapped accessible 1 floor apartment    [place] and OT and SLP  Team conference today to determine length of stay 2.  Antithrombotics: -DVT/anticoagulation:  Pharmaceutical: Lovenox             -antiplatelet therapy: none             -LUE duplex ordered on admission for edema   10/31- will order LLE Dopplers to rule out DVT since has abrupt onset/worsening LLE edema 3. Pain Management: Tylenol, Robaxin, Dilaudid 4 mg q 4 hours as needed   10/30- will add gabapentin 300 mg BID- goal to get off pain meds by d/c that are controlled.   10/31- still having pain, but explained that phantom pain doesn't respond to opiates- it responds to nerve pain meds- said has tolerated gabapentin in past  4. Mood/Behavior/Sleep: LCSW to evaluate and provide emotional support             -h/o bipolar>>continue Abilify, Prozac             -h/o anxiety>>continue clonazepam 0.5 mg BID as needed             -antipsychotic agents: n/a   5. Neuropsych/cognition: This patient is capable of making decisions on her own behalf.             -SLP eval for difficulty with higher cognitive tasks   6. Skin/Wound Care: Routine skin care checks             -  maintain negative pressure dressing dressing through next Tuesday 10/31              - Wound vac per ortho             10/31- will write for VAC removal 7. 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 Juleen China: At discharge from rehab, DC PICC after 1x dose of oritavancin; then transition to oral linezolid to complete course at 51/76   9: Alcoholic cirrhosis/ascites/elevated LFTs/esophageal varices:             -continue Lasix 40 mg daily             -continue lactulose 20 grams BID (goal 2-3 bowel movements daily)             -continue nadolol 20 mg daily             -continue aldactone 100 mg daily             -continue Folvite, thiamine supplementation             -Gets OP Qmonthly ascites taps; will need to arrange with IR while admitted   10/30- having 4-7 BM's day will need ot check with next paracentesis is needed 10: Seizure disorder: continue Keppra   11. History polysubstance abuse            - Hx alcohol, THC - none current per patient            - Plan to DC picc prior to discharge as above   12. AKI on CKD stage 3. Admission labs. Baseline Cr Appears 0.4-0.7.       Latest Ref Rng & Units 07/08/2022    4:19 AM 07/06/2022    2:08 AM 07/05/2022    6:07 AM  BMP  Glucose 70 - 99 mg/dL 129  104  86   BUN 6 - 20 mg/dL '10  13  15   '$ Creatinine 0.44 - 1.00 mg/dL 0.51  0.55  0.64   Sodium 135 - 145 mmol/L 134  132  135   Potassium 3.5 - 5.1 mmol/L 3.4  3.9  3.9   Chloride 98 - 111 mmol/L 100  94  97   CO2 22 - 32 mmol/L '27  29  30   '$ Calcium 8.9 - 10.3 mg/dL 8.3  8.3  9.3     AKI resolved   13.  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  15. Hypokalemia  10/30- will replete  40 mEq x1 and recheck later in week 16. LLE edema  10/31- will get Dopplers of LLE since has edema and has missed multiple doses of lovenox- due to refusal- also ordered TEDs for LLE 17. Cough  10/31- ordered Tessalon pearles TID prn for cough- already has robitussin prn.   I spent a total of 41    minutes on total care today- >50% coordination of care- due to team conference, d/w pt about LLE edema- and cough And dopplers ordered  LOS: 4 days A FACE TO FACE EVALUATION WAS PERFORMED  Julien Oscar 07/09/2022, 8:14 AM

## 2022-07-09 NOTE — Progress Notes (Signed)
Occupational Therapy Session Note  Patient Details  Name: Briana Sweeney MRN: [id number] Date of Birth: 13-Sep-1970  Today's Date: 07/09/2022 OT Individual Time: 1111-1200 OT Individual Time Calculation (min): 49 min     OT Missed Time: 11 Minutes Missed Time Reason: Nursing care   Short Term Goals: Week 1:  OT Short Term Goal 1 (Week 1): Pt will complete toilet transfers with supervision OT Short Term Goal 2 (Week 1): Pt will complete 2/3 components of toileting with supervision OT Short Term Goal 3 (Week 1): Pt will don LB clothing with CGA at sit to stand level OT Short Term Goal 4 (Week 1): Pt will initate IADL retraining in prep for living alone  Skilled Therapeutic Interventions/Progress Updates:     Patient agreeable to participate in OT session. Reports 1010 pain level in right residual limb. Nursing provided pain medication during session. Nursing currently with Nursing who is removing wound vac upon therapy arrival.    Patient participated in skilled OT session focusing on UE HEP and functional transfers. Therapist educated on UE HEP utilizing red theraband in order to improve BUE strengthening required to complete functional surface to surface transfers.  Pt provided with visual demonstration and VC for proper form and technique. Require Min A when completing shoulder abduction in order to achieve proper form. Handout provided along with tband.   Focused on functional transfer technique using RW to complete chair to mat table and back. VC with demonstration provide to place right hand on walker and push up from w/c, as well as performing heel/toe pvt to change direction.    Therapy Documentation Precautions:  Precautions Precautions: Fall Precaution Comments: Reviewed verbally Restrictions Weight Bearing Restrictions: Yes RLE Weight Bearing: Non weight bearing General: General OT Amount of Missed Time: 11 Minutes    Therapy/Group: Individual Therapy  Ailene Ravel, OTR/L,CBIS  Supplemental OT - [facility]  07/09/2022, 12:40 PM

## 2022-07-09 NOTE — Patient Instructions (Signed)
1) Strengthening: Chest Pull - Resisted   Hold Theraband in front of body with hands about shoulder width a part. Pull band a part and back together slowly. Repeat __10_ times. Complete __1__ set(s) per session.. Repeat 1 session(s) per day.  http://orth.exer.us/926   Copyright  VHI. All rights reserved.   2) PNF Strengthening: Resisted   Standing with resistive band around each hand, bring right arm up and away, thumb back. Repeat __10__ times per set. Do _1___ sets per session. Do _1___ sessions per day. Complete 1 set with each arm.   3) Resisted External Rotation: in Neutral - Bilateral   Sit or stand, tubing in both hands, elbows at sides, bent to 90, forearms forward. Pinch shoulder blades together and rotate forearms out. Keep elbows at sides. Repeat ___10_ times per set. Do _1___ sets per session. Do _1___ sessions per day.  http://orth.exer.us/966   Copyright  VHI. All rights reserved.   4) PNF Strengthening: Resisted   Standing, hold resistive band above head. Bring right arm down and out from side. Repeat __10__ times per set. Do __1__ sets per session. Do _1___ sessions per day. Complete 1 set with each arm.

## 2022-07-09 NOTE — Progress Notes (Signed)
Janeal Holmes reviewed this student nurses assessment and agree with it

## 2022-07-10 ENCOUNTER — Encounter: Payer: Self-pay | Admitting: Oncology

## 2022-07-10 ENCOUNTER — Other Ambulatory Visit: Payer: Medicaid Other

## 2022-07-10 ENCOUNTER — Inpatient Hospital Stay (HOSPITAL_COMMUNITY): Payer: No Typology Code available for payment source

## 2022-07-10 DIAGNOSIS — M869 Osteomyelitis, unspecified: Secondary | ICD-10-CM | POA: Diagnosis not present

## 2022-07-10 HISTORY — PX: IR PARACENTESIS: IMG2679

## 2022-07-10 MED ORDER — GERHARDT'S BUTT CREAM
TOPICAL_CREAM | Freq: Two times a day (BID) | CUTANEOUS | Status: DC
  Filled 2022-07-10: qty 1

## 2022-07-10 MED ORDER — WITCH HAZEL-GLYCERIN EX PADS
MEDICATED_PAD | Freq: Four times a day (QID) | CUTANEOUS | Status: DC
  Administered 2022-07-10 – 2022-07-12 (×2): 1 via TOPICAL
  Filled 2022-07-10: qty 100

## 2022-07-10 MED ORDER — LIDOCAINE HCL 1 % IJ SOLN
INTRAMUSCULAR | Status: AC
  Filled 2022-07-10: qty 20

## 2022-07-10 MED ORDER — HYDROCORTISONE (PERIANAL) 2.5 % EX CREA
TOPICAL_CREAM | Freq: Two times a day (BID) | CUTANEOUS | Status: DC
  Filled 2022-07-10: qty 28.35

## 2022-07-10 NOTE — Progress Notes (Signed)
PROGRESS NOTE   Subjective/Complaints:  Reports increase in gabapentin to 300 mg QID yesterday was somewhat helpful.  Hurts where VAC was removed more than anything.  Still has blood in stool as of last evening, but got anusol suppositories and has used x2.  Wants to stop lovenox.   ROS:  Pt denies SOB, abd pain, CP, N/V/C/D, and vision changes  Except per HPI Objective:   IR Paracentesis  Result Date: 07/10/2022 INDICATION: Patient with history of cirrhosis, recurrent ascites. Request is made for therapeutic paracentesis. EXAM: ULTRASOUND GUIDED THERAPEUTIC PARACENTESIS MEDICATIONS: 10 mL 1% lidocaine COMPLICATIONS: None immediate. PROCEDURE: Informed written consent was obtained from the patient after a discussion of the risks, benefits and alternatives to treatment. A timeout was performed prior to the initiation of the procedure. Initial ultrasound scanning demonstrates a small amount of ascites within the left lateral abdomen. The left lateral abdomen was prepped and draped in the usual sterile fashion. 1% lidocaine was used for local anesthesia. Following this, a 19 gauge, 7-cm, Yueh catheter was introduced. An ultrasound image was saved for documentation purposes. The paracentesis was performed. The catheter was removed and a dressing was applied. The patient tolerated the procedure well without immediate post procedural complication. FINDINGS: A total of approximately 800 mL of clear, yellow fluid was removed. IMPRESSION: Successful ultrasound-guided paracentesis yielding 800 mL of peritoneal fluid. Read by: Brynda Greathouse PA-C PLAN: If the patient eventually requires >/=2 paracenteses in a 30 day period, candidacy for formal evaluation by the [facility] will be assessed. Electronically Signed   By: Markus Daft M.D.   On: 07/10/2022 17:36   VAS [place] LOWER EXTREMITY VENOUS  (DVT)  Result Date: 07/09/2022  Lower Venous DVT Study Patient Name:  Briana Sweeney  Date of Exam:   07/09/2022 Medical Rec #: [id number]          Accession #:    [id number] Date of Birth: [birth year]          Patient Gender: F Patient Age:   51 years Exam Location:  [facility] Procedure:      VAS [place] LOWER EXTREMITY VENOUS (DVT) Referring Phys: Sury Wentworth --------------------------------------------------------------------------------  Indications: Swelling.  Risk Factors: Surgery. Limitations: Poor ultrasound/tissue interface. Comparison Study: No prior studies. Performing Technologist: Oliver Hum RVT  Examination Guidelines: A complete evaluation includes B-mode imaging, spectral Doppler, color Doppler, and power Doppler as needed of all accessible portions of each vessel. Bilateral testing is considered an integral part of a complete examination. Limited examinations for reoccurring indications may be performed as noted. The reflux portion of the exam is performed with the patient in reverse Trendelenburg.  +-----+---------------+---------+-----------+----------+--------------+ RIGHTCompressibilityPhasicitySpontaneityPropertiesThrombus Aging +-----+---------------+---------+-----------+----------+--------------+ CFV  Full           Yes      Yes                                 +-----+---------------+---------+-----------+----------+--------------+   +---------+---------------+---------+-----------+----------+--------------+ LEFT     CompressibilityPhasicitySpontaneityPropertiesThrombus Aging +---------+---------------+---------+-----------+----------+--------------+ CFV      Full           Yes  Yes                                 +---------+---------------+---------+-----------+----------+--------------+ SFJ      Full                                                        +---------+---------------+---------+-----------+----------+--------------+ FV Prox   Full                                                        +---------+---------------+---------+-----------+----------+--------------+ FV Mid   Full                                                        +---------+---------------+---------+-----------+----------+--------------+ FV DistalFull                                                        +---------+---------------+---------+-----------+----------+--------------+ PFV      Full                                                        +---------+---------------+---------+-----------+----------+--------------+ POP      Full           Yes      Yes                                 +---------+---------------+---------+-----------+----------+--------------+ PTV      Full                                                        +---------+---------------+---------+-----------+----------+--------------+ PERO     Full                                                        +---------+---------------+---------+-----------+----------+--------------+    Summary: RIGHT: - No evidence of common femoral vein obstruction.  LEFT: - There is no evidence of deep vein thrombosis in the lower extremity.  - No cystic structure found in the popliteal fossa.  *See table(s) above for measurements and observations. Electronically signed by Harold Barban MD on 07/09/2022 at 8:49:44 PM.    Final    Recent Labs    07/08/22 0419  WBC 8.8  HGB 10.9*  HCT 33.1*  PLT 163   Recent Labs    07/08/22 0419  NA 134*  K 3.4*  CL 100  CO2 27  GLUCOSE 129*  BUN 10  CREATININE 0.51  CALCIUM 8.3*    Intake/Output Summary (Last 24 hours) at 07/10/2022 1942 Last data filed at 07/10/2022 1700 Gross per 24 hour  Intake 691 ml  Output --  Net 691 ml        Physical Exam: Vital Signs Blood pressure 93/61, pulse 88, temperature 98.8 F (37.1 C), temperature source Oral, resp. rate 17, height '4\' 11"'$  (1.499 m), weight 69.8 kg, SpO2 95  %.    General: awake, alert, appropriate, delayed responses and slow to answer; sitting up some in bed; wearing limb guard; NAD HENT: conjugate gaze; oropharynx moist- wearing eye make up CV: regular rate; no JVD Pulmonary: CTA B/L; no W/R/R- good air movement GI: more firm, distended; tympanic- more than before; distended from fluid- has wave; (+) BS Psychiatric: appropriate- but delayed responses Neurological: alert  MS: VAC off- limb guard in place Extremities: No clubbing, cyanosis, or edema Skin: No evidence of breakdown, no evidence of rash Neurologic: Cranial nerves II through XII intact, motor strength is 5/5 in bilateral deltoid, bicep, tricep, grip, B hip flexor,left  knee extensors, ankle dorsiflexor and plantar flexor  Musculoskeletal: Full range of motion in all 4 extremities. No joint swelling RIght BKA wound vac , cannot visualize incision   Assessment/Plan: 1. Functional deficits which require 3+ hours per day of interdisciplinary therapy in a comprehensive inpatient rehab setting. Physiatrist is providing close team supervision and 24 hour management of active medical problems listed below. Physiatrist and rehab team continue to assess barriers to discharge/monitor patient progress toward functional and medical goals  Care Tool:  Bathing    Body parts bathed by patient: Right arm, Left arm, Chest, Abdomen, Front perineal area, Right upper leg, Left upper leg, Left lower leg, Face   Body parts bathed by helper: Buttocks Body parts n/a: Right lower leg   Bathing assist Assist Level: Minimal Assistance - Patient > 75%     Upper Body Dressing/Undressing Upper body dressing   What is the patient wearing?: Pull over shirt, Bra    Upper body assist Assist Level: Supervision/Verbal cueing    Lower Body Dressing/Undressing Lower body dressing      What is the patient wearing?: Underwear/pull up, Pants     Lower body assist Assist for lower body dressing:  Moderate Assistance - Patient 50 - 74%     Toileting Toileting    Toileting assist Assist for toileting: Minimal Assistance - Patient > 75%     Transfers Chair/bed transfer  Transfers assist     Chair/bed transfer assist level: Moderate Assistance - Patient 50 - 74%     Locomotion Ambulation   Ambulation assist   Ambulation activity did not occur: Safety/medical concerns (2/2 wound vac on R BKA)          Walk 10 feet activity   Assist  Walk 10 feet activity did not occur: Safety/medical concerns (2/2 wound vac on R BKA)        Walk 50 feet activity   Assist Walk 50 feet with 2 turns activity did not occur: Safety/medical concerns (2/2 wound vac on R BKA)         Walk 150 feet activity   Assist Walk 150 feet activity did not occur: Safety/medical concerns (2/2 wound vac on R BKA)  Walk 10 feet on uneven surface  activity   Assist Walk 10 feet on uneven surfaces activity did not occur: Safety/medical concerns (2/2 wound vac on R BKA)         Wheelchair     Assist Is the patient using a wheelchair?: Yes Type of Wheelchair: Manual    Wheelchair assist level: Minimal Assistance - Patient > 75% Max wheelchair distance: 175 ft (per PT verbal report)    Wheelchair 50 feet with 2 turns activity    Assist        Assist Level: Minimal Assistance - Patient > 75% (per PT verbal report, pt completed w/c mobility for 175 ft Min A)   Wheelchair 150 feet activity     Assist      Assist Level: Minimal Assistance - Patient > 75% (per PT verbal report, pt completed w/c mobility 175 feet with Min A)   Blood pressure 93/61, pulse 88, temperature 98.8 F (37.1 C), temperature source Oral, resp. rate 17, height '4\' 11"'$  (1.499 m), weight 69.8 kg, SpO2 95 %.  Medical Problem List and Plan: 1. Functional deficits secondary to right BKA             -patient may shower with tubing clamped, vac disconnected. Do not submerge.               -ELOS/Goals: 10-14 days             - Lives alone in handicapped accessible 1 floor apartment    D/c date 11/13  Con't CIR- PT, OT and SLP 2.  Antithrombotics: -DVT/anticoagulation:  Pharmaceutical: Lovenox             -antiplatelet therapy: none             -LUE duplex ordered on admission for edema   10/31- will order LLE Dopplers to rule out DVT since has abrupt onset/worsening LLE edema  11/1- Dopplers (-)- done today- due to continuing bleeding/hemorrhoids and hx fof frequent plt transfusions, will stop lovenox per pt request 3. Pain Management: Tylenol, Robaxin, Dilaudid 4 mg q 4 hours as needed   10/30- will add gabapentin 300 mg BID- goal to get off pain meds by d/c that are controlled.   10/31- still having pain, but explained that phantom pain doesn't respond to opiates- it responds to nerve pain meds- said has tolerated gabapentin in past  11/1- increased gabapentin to 300 mg QID 4. Mood/Behavior/Sleep: LCSW to evaluate and provide emotional support             -h/o bipolar>>continue Abilify, Prozac             -h/o anxiety>>continue clonazepam 0.5 mg BID as needed             -antipsychotic agents: n/a   5. Neuropsych/cognition: This patient is capable of making decisions on her own behalf.             -SLP eval for difficulty with higher cognitive tasks   11/1- has higher level cognitive issues- will need assist for money/med mgmt. [facility] [place] 6. Skin/Wound Care: Routine skin care checks             -  maintain negative pressure dressing dressing through next Tuesday 10/31             - Wound vac per ortho             10/31- will write for [facility] removal  11/1- removed- has two new small sacral spots 7. 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 Juleen China: At discharge from rehab, DC PICC after 1x dose of oritavancin; then transition to oral linezolid to complete course at 59/93   9: Alcoholic cirrhosis/ascites/elevated LFTs/esophageal varices:             -continue Lasix 40 mg daily             -continue lactulose 20 grams BID (goal 2-3 bowel movements daily)             -continue nadolol 20 mg daily             -continue aldactone 100 mg daily             -continue Folvite, thiamine supplementation             -Gets OP Qmonthly ascites taps; will need to arrange with IR while admitted   10/30- having 4-7 BM's day will need ot check with next paracentesis is needed  11/1- will get paracentesis today- got out 800cc 10: Seizure disorder: continue Keppra   11. History polysubstance abuse            - Hx alcohol, THC - none current per patient            - Plan to DC picc prior to discharge as above   12. AKI on CKD stage 3. Admission labs. Baseline Cr Appears 0.4-0.7.       Latest Ref Rng & Units 07/08/2022    4:19 AM 07/06/2022    2:08 AM 07/05/2022    6:07 AM  BMP  Glucose 70 - 99 mg/dL 129  104  86   BUN 6 - 20 mg/dL '10  13  15   '$ Creatinine 0.44 - 1.00 mg/dL 0.51  0.55  0.64   Sodium 135 - 145 mmol/L 134  132  135   Potassium 3.5 - 5.1 mmol/L 3.4  3.9  3.9   Chloride 98 - 111 mmol/L 100  94  97   CO2 22 - 32 mmol/L '27  29  30   '$ Calcium 8.9 - 10.3 mg/dL 8.3  8.3  9.3     AKI resolved   13.  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   11/1- up to 300 mg QID 15.  Hypokalemia  10/30- will replete 40 mEq x1 and recheck later in week 16. LLE edema  10/31- will get Dopplers of LLE since has  edema and has missed multiple doses of lovenox- due to refusal- also ordered TEDs for LLE  11/1- no DVT 17. Cough  10/31- ordered Tessalon pearles TID prn for cough- already has robitussin prn.  18. Anal bleeding/hemorrhoids (pt said she 11/1 - started anusol suppository, cream and witch hazel pads.    I spent a total of  42   minutes on total care today- >50% coordination of care- due to d/w PA about paracentesis and with IR; also with pt about hemorrhoids/nerve pain   LOS: 5 days A FACE TO FACE EVALUATION WAS PERFORMED  Jerrell Hart 07/10/2022, 7:42 PM

## 2022-07-10 NOTE — Progress Notes (Signed)
Notified by RN of small sacral ulceration.  She has had 2 bowel movements today without blood.  No external blood on exam. Encourage off-loading. Will start Gerhardt's for rash.

## 2022-07-10 NOTE — Progress Notes (Signed)
Occupational Therapy Session Note  Patient Details  Name: Briana Sweeney MRN: [id number] Date of Birth: April 30, 1971  Today's Date: 07/10/2022 OT Individual Time: 0800-0900 OT Individual Time Calculation (min): 60 min    Short Term Goals: Week 1:  OT Short Term Goal 1 (Week 1): Pt will complete toilet transfers with supervision OT Short Term Goal 2 (Week 1): Pt will complete 2/3 components of toileting with supervision OT Short Term Goal 3 (Week 1): Pt will don LB clothing with CGA at sit to stand level OT Short Term Goal 4 (Week 1): Pt will initate IADL retraining in prep for living alone  Skilled Therapeutic Interventions/Progress Updates:   Pt in bed upon OT arrival. Pt open to incorporating functional transfer and standing balance with shower training, toileting, dressing and sink side grooming,  re-training visit. OT focus on energy conservation, safety and skin protection as well with wound vac discharged and now able to shower for 1st time. Bed to and from w/c with lateral squat pivot transfer with CGA and mod cues, OT assist to cover R residual limb and manage w/c. Pt trained and performed UB bathing with close S only, LB bathing min A seated with mod a support to push up on grab bars for peri hygiene and buttocks reach, and UB dressing and grooming seated with set up only w/c level seated as nursing requested OT to leave pt's lower body garments off to perform suppository insertion after session. .  Reports 4/10 R residual limb pain.  Assisted pt back to bed after session with all safety measures and needs in place. Nursing notified to come in for procedure.     Therapy Documentation Precautions:  Precautions Precautions: Fall Precaution Comments: Reviewed verbally Restrictions Weight Bearing Restrictions: Yes RLE Weight Bearing: Non weight bearing   Therapy/Group: Individual Therapy  Barnabas Lister 07/10/2022, 7:52 AM

## 2022-07-10 NOTE — Progress Notes (Signed)
Speech Language Pathology Weekly Progress and Session Note  Patient Details  Name: Briana Sweeney MRN: [id number] Date of Birth: [birth year]  Beginning of progress report period: July 05, 2022 End of progress report period: July 12, 2022   Short Term Goals: Week 1: SLP Short Term Goal 1 (Week 1): Pt will demonstrate sustained attention in 5-7 minute intervals with min A verbal cues for redirection. SLP Short Term Goal 1 - Progress (Week 1): Not met SLP Short Term Goal 2 (Week 1): Pt will demonstrate recall with external aids of daily events and information with mod A verbal cues. SLP Short Term Goal 2 - Progress (Week 1): Met SLP Short Term Goal 3 (Week 1): Pt will demonstrate basic-mildly complex problem solving skills with mod A verbal cues. SLP Short Term Goal 3 - Progress (Week 1): Met SLP Short Term Goal 4 (Week 1): Pt will demonstrate ability to correct errors in functional tasks with max A verbal cues. SLP Short Term Goal 4 - Progress (Week 1): Met    New Short Term Goals: Week 2: SLP Short Term Goal 1 (Week 2): STG=LTG due to ELOS 11/13  Weekly Progress Updates: Pt made moderate progress meeting 3 out 4 goals. Pt demonstrated growth in error awareness basic problem solving and use of external aid /memory notebook for short term recall. Pt's barriers are sustained attention, fatigue, recall, basic-mildy complex problem solving and error awareness. Pt would continue to benefit from skilled ST services in order to maximize functional independence and reduce burden of care requiring 24 hour supervision and continued ST services.    Intensity: Minumum of 1-2 x/day, 30 to 90 minutes Frequency: 3 to 5 out of 7 days Duration/Length of Stay: 11/13 Treatment/Interventions: Cognitive remediation/compensation;Internal/external aids;Medication managment;Cueing hierarchy;Functional tasks;Patient/family education   [name]  [facility] 07/10/2022, 4:10 PM

## 2022-07-10 NOTE — Progress Notes (Signed)
Physical Therapy Session Note  Patient Details  Name: Tiffay Pinette MRN: [id number] Date of Birth: Feb 03, 1971  Today's Date: 07/10/2022 PT Missed Time: 75 Minutes Missed Time Reason: Unavailable (Comment);Other (Comment) (paracentesis)  Short Term Goals: Week 1:  PT Short Term Goal 1 (Week 1): STG = LTG 2/2 LOS  Skilled Therapeutic Interventions/Progress Updates:      Therapy Documentation Precautions:  Precautions Precautions: Fall Precaution Comments: Reviewed verbally Restrictions Weight Bearing Restrictions: Yes RLE Weight Bearing: Non weight bearing General: PT Amount of Missed Time (min): 75 Minutes PT Missed Treatment Reason: Unavailable (Comment);Other (Comment) (paracentesis)  Pt missed 75 minutes of skilled PT as pt off the unit for paracentesis. Will follow up to make up for missed time as able.     Therapy/Group: Individual Therapy  Verl Dicker Verl Dicker PT, DPT  07/10/2022, 3:37 PM

## 2022-07-10 NOTE — Progress Notes (Signed)
Pharmacy Antibiotic Note  Briana Sweeney is a 51 y.o. female admitted on 07/05/2022 with  R ankle injury and concern for infection . Blood cx (1 of 2) obtained on admission with MRSA.  Patient was initially on Vancomycin, last dose 10/21 at 9pm.  In the setting of AKI with SCr up 2.2 pharmacy consulted for Daptomycin dosing.   Patient currently afebrile, wbc 8. Scr has returned back to normal at 0.5. 10/23 CK 15 10/31 CK 29    Plan: Daptomycin '500mg'$  (~'8mg'$ /kg for bacteremia) IV q24 hours while inpatient Per ID, will give oritavancin 1200 mg x1 followed by linezolid 600 mg BID for 7 days upon discharge Monitor weekly CK, clinical status  Height: '4\' 11"'$  (149.9 cm) Weight: 69.8 kg (153 lb 14.1 oz) IBW/kg (Calculated) : 43.2  Temp (24hrs), Avg:98.2 F (36.8 C), Min:97.6 F (36.4 C), Max:98.5 F (36.9 C)  Recent Labs  Lab 07/04/22 0449 07/05/22 0607 07/06/22 0208 07/08/22 0419  WBC 14.4* 9.6 8.8 8.8  CREATININE 0.48 0.64 0.55 0.51     Estimated Creatinine Clearance: 71.5 mL/min (by C-G formula based on SCr of 0.51 mg/dL).    Allergies  Allergen Reactions   Other Other (See Comments)    Platelets: Rx chest pain, tremors, body aches  Patient stated that she receives platelet injection every Wednesday     Antimicrobials this admission: Rocephin 10/21 >> 10/22 Vanc  10/21 >> 10/22 Daptomycin 10/22 >>  Microbiology results: 10/21 BCx: 1/2 GCP, BCID with MRSA  Erin Hearing PharmD., BCPS Clinical Pharmacist 07/10/2022 1:56 PM

## 2022-07-10 NOTE — Progress Notes (Signed)
Speech Language Pathology Daily Session Note  Patient Details  Name: Briana Sweeney MRN: [id number] Date of Birth: 29-Apr-1971  Today's Date: 07/10/2022 SLP Individual Time: 1003-1045 SLP Individual Time Calculation (min): 42 min  Short Term Goals: Week 1: SLP Short Term Goal 1 (Week 1): Pt will demonstrate sustained attention in 5-7 minute intervals with min A verbal cues for redirection. SLP Short Term Goal 2 (Week 1): Pt will demonstrate recall with external aids of daily events and information with mod A verbal cues. SLP Short Term Goal 3 (Week 1): Pt will demonstrate basic-mildly complex problem solving skills with mod A verbal cues. SLP Short Term Goal 4 (Week 1): Pt will demonstrate ability to correct errors in functional tasks with max A verbal cues.  Skilled Therapeutic Interventions:Skilled ST services focused on cognitive skills. Pt required supervision A verbal cues to recall memory notebook, but initially recalled inaccurate events and did not check notebook without a prompt SLP facilitated mildly complex problem solving, recall and sustained attention in calendar task. Pt demonstrated sustained attention in 1 minute intervals, after 5 minutes required max-mod A verbal cues. Pt's eyes were fluttering open/closed and pt supported headache impacted fatigue. Pt required max A verbal cues for recall within task and mod A verbal cues for problem solving. Poor attention and recall impacted take performance the most. Pt was left in room with bed alarm set. Recommend to continue ST services.      Pain Pain Assessment Faces Pain Scale: No hurt  Therapy/Group: Individual Therapy  Birney Belshe  [facility] 07/10/2022, 4:05 PM

## 2022-07-10 NOTE — Procedures (Signed)
PROCEDURE SUMMARY:  Successful US guided paracentesis from left lateral abdomen.  Yielded 800 mL of yellow, clear fluid.  No immediate complications.  Pt tolerated well.   Specimen was not sent for labs.  EBL < 16m  KDocia BarrierPA-C 07/10/2022 4:14 PM

## 2022-07-10 NOTE — Progress Notes (Signed)
Occupational Therapy Note  Patient Details  Name: Briana Sweeney MRN: [id number] Date of Birth: 02/15/1971  Today's Date: 07/10/2022 OT Missed Time: 18 Minutes Missed Time Reason: Pain;Patient fatigue  Pt greeted supine in bed reporting fatigue and pain, pt declined session even with option of ADL or bed level session offered. Pt noted to nod off during conversation and having difficulty sustaining attention d/t lethargy. Will f/u as time allows to make up for missed minutes.   Corinne Ports [facility] 07/10/2022, 11:55 AM

## 2022-07-11 ENCOUNTER — Ambulatory Visit: Payer: Medicaid Other

## 2022-07-11 DIAGNOSIS — M869 Osteomyelitis, unspecified: Secondary | ICD-10-CM | POA: Diagnosis not present

## 2022-07-11 LAB — CBC WITH DIFFERENTIAL/PLATELET
Abs Immature Granulocytes: 0.04 10*3/uL (ref 0.00–0.07)
Basophils Absolute: 0.1 10*3/uL (ref 0.0–0.1)
Basophils Relative: 1 %
Eosinophils Absolute: 0.2 10*3/uL (ref 0.0–0.5)
Eosinophils Relative: 2 %
HCT: 34 % — ABNORMAL LOW (ref 36.0–46.0)
Hemoglobin: 11 g/dL — ABNORMAL LOW (ref 12.0–15.0)
Immature Granulocytes: 1 %
Lymphocytes Relative: 14 %
Lymphs Abs: 1.1 10*3/uL (ref 0.7–4.0)
MCH: 29.5 pg (ref 26.0–34.0)
MCHC: 32.4 g/dL (ref 30.0–36.0)
MCV: 91.2 fL (ref 80.0–100.0)
Monocytes Absolute: 0.9 10*3/uL (ref 0.1–1.0)
Monocytes Relative: 11 %
Neutro Abs: 6 10*3/uL (ref 1.7–7.7)
Neutrophils Relative %: 71 %
Platelets: 144 10*3/uL — ABNORMAL LOW (ref 150–400)
RBC: 3.73 MIL/uL — ABNORMAL LOW (ref 3.87–5.11)
RDW: 29 % — ABNORMAL HIGH (ref 11.5–15.5)
WBC: 8.4 10*3/uL (ref 4.0–10.5)
nRBC: 0 % (ref 0.0–0.2)

## 2022-07-11 LAB — BASIC METABOLIC PANEL
Anion gap: 9 (ref 5–15)
BUN: 10 mg/dL (ref 6–20)
CO2: 25 mmol/L (ref 22–32)
Calcium: 8 mg/dL — ABNORMAL LOW (ref 8.9–10.3)
Chloride: 99 mmol/L (ref 98–111)
Creatinine, Ser: 0.5 mg/dL (ref 0.44–1.00)
GFR, Estimated: >60 mL/min (ref >60)
Glucose, Bld: 102 mg/dL — ABNORMAL HIGH (ref 70–99)
Potassium: 3.9 mmol/L (ref 3.5–5.1)
Sodium: 133 mmol/L — ABNORMAL LOW (ref 135–145)

## 2022-07-11 NOTE — Progress Notes (Signed)
Physical Therapy Session Note  Patient Details  Name: Briana Sweeney MRN: [id number] Date of Birth: 1971/01/15  Today's Date: 07/11/2022 PT Individual Time: 1020-1045 PT Individual Time Calculation (min): 25 min   Short Term Goals: Week 1:  PT Short Term Goal 1 (Week 1): STG = LTG 2/2 LOS  Skilled Therapeutic Interventions/Progress Updates:   Pt received supine in bed and agreeable to PT. Supine>sit transfer with supervision assist and  cues for balance to prevent posterior bias  Stand pivot transfer with CGA and RW.. WC mobility through hall x 112f with supervision assist. Sitting/Standing balance BITS sitting x 1 standing x 2 with 1 UE supported on RW. Supervision assist for safety with cues for visual scanning throughout.   Pt returned to room and performed stand pivot transfer to bed with UE support on bed rail. Sit>supine completed with supervision assist of safety, and left supine in bed with call bell in reach and all needs met.       Therapy Documentation Precautions:  Precautions Precautions: Fall Precaution Comments: Reviewed verbally Restrictions Weight Bearing Restrictions: Yes RLE Weight Bearing: Non weight bearing    Vital Signs: Therapy Vitals Temp: 100.1 F (37.8 C) Temp Source: Oral Pulse Rate: 89 Resp: 16 BP: 107/65 Patient Position (if appropriate): Lying Oxygen Therapy SpO2: 94 % O2 Device: Room Air Pain: 5/10 residual limb. See EMAR.     Therapy/Group: Individual Therapy  ALorie Phenix11/10/2021, 6:01 PM

## 2022-07-11 NOTE — Progress Notes (Addendum)
Speech Language Pathology Daily Session Note  Patient Details  Name: Briana Sweeney MRN: [id number] Date of Birth: [birth year]  Today's Date: 07/11/2022 SLP Individual Time: 8469-6295 SLP Individual Time Calculation (min): 50 min  Short Term Goals: Week 2: SLP Short Term Goal 1 (Week 2): STG=LTG due to ELOS 11/13  Skilled Therapeutic Interventions: S: Pt seen this date for skilled ST intervention targeting cognitive goals outlined above. Pt received awake, though fatigued and sitting upright in bed. Agreeable to ST intervention at bedside. Participatory with encouragement.  O: SLP facilitated today's session by providing Max A for verbal and written sequencing of  daily events, Max A for use of memory notebook, and Mod A for sustained attention to task. Recalled 4 out of 5 items on factitious grocery list given Min A; Max A for encoding. Appeared to benefit from use of writing, rehearsal, and association memory strategies. Max A to problem-solving with hospital bed features and phone (answering Facebook messenger call). Pt exhibits intermittent recall of some daily information.   A: Pt appears somewhat sitmulable for skilled ST intervention. Recommend supervision and assistance with iADL tasks at time of d/c from IPR.  P: Pt left in room with all safety measures activated. Call bell reviewed and within reach and all immediate needs met. Friend present at the end of today's session. Continue per current ST POC next session.   Pain Reports R leg pain; states RN recently provided pain medication  Therapy/Group: Individual Therapy  Miasia Crabtree A Sheanna Dail 07/11/2022, 1:38 PM

## 2022-07-11 NOTE — Progress Notes (Signed)
Physical Therapy Session Note  Patient Details  Name: Briana Sweeney MRN: [id number] Date of Birth: 02/09/1971  Today's Date: 07/11/2022 PT Individual Time: 0802-0900 PT Individual Time Calculation (min): 58 min   Short Term Goals: Week 1:  PT Short Term Goal 1 (Week 1): STG = LTG 2/2 LOS  Skilled Therapeutic Interventions/Progress Updates:      Therapy Documentation Precautions:  Precautions Precautions: Fall Precaution Comments: Reviewed verbally Restrictions Weight Bearing Restrictions: Yes RLE Weight Bearing: Non weight bearing   Pt received semi-reclined in bed, agreeable to PT session with emphasis on transfer and LE strength training. Pt reports 9/10 pain and nursing notified and administered medications during session. PT performed desensitization technique to residual limb to address right foot phantom limb pain, pt reports no improvement in symptoms following intervention. Pt requested need to toilet and requires SBA for w/c>BSC>w/c transfer. Pt requires minimal assistance for w/c part management. Pt transported by w/c to ortho gym for time management and particpated in blocked practice of stand pivot transfers with RW. Pt requires verbal cues for sequencing and hand placement with transfers for safe technique. Pt transitioned to practice sit to stands with RW 3x 10 for LE strength and coordination with max cueing for hand placement. Pt transported to room and left semi-reclined in bed with all needs in reach and bed alarm on.    Therapy/Group: Individual Therapy  Verl Dicker Verl Dicker PT, DPT  07/11/2022, 7:37 AM

## 2022-07-11 NOTE — Progress Notes (Signed)
Occupational Therapy Session Note  Patient Details  Name: Briana Sweeney MRN: [id number] Date of Birth: [birth year]  Today's Date: 07/11/2022 OT Individual Time: 1583-0940 OT Individual Time Calculation (min): 72 min    Short Term Goals: Week 1:  OT Short Term Goal 1 (Week 1): Pt will complete toilet transfers with supervision OT Short Term Goal 2 (Week 1): Pt will complete 2/3 components of toileting with supervision OT Short Term Goal 3 (Week 1): Pt will don LB clothing with CGA at sit to stand level OT Short Term Goal 4 (Week 1): Pt will initate IADL retraining in prep for living alone  Skilled Therapeutic Interventions/Progress Updates:  Pt seen for skilled OT services this pm. Pt in bed asleep after having a procedure late yesterday and reporting not sleeping well. Increased time for alertness needed. Once EOB with S, pt transferred bed to w/c with CGA laterally to R side. Pt required toileting with OT assist to navigate w/c and manage arm rest and wheel locks. Lateral transfer on and off with min A and mod A for LB garment mngt. Close S for peri hygiene but no voiding occurred. Pt agreeable to participate in unit Fall Festival. Pt self propelled w/c 40 ft x 2 intervals with close S. Completed partially a craft project with cues for attention and sequencing. Stood x 3 intervals with support and mod A for corn hole game with decreased balance for functional reach 1-2". Pt transported back to room and left with care of [facility] NT for vitals.   Therapy Documentation Precautions:  Precautions Precautions: Fall Precaution Comments: Reviewed verbally Restrictions Weight Bearing Restrictions: Yes RLE Weight Bearing: Non weight bearing    Therapy/Group: Individual Therapy  Barnabas Lister 07/11/2022, 7:50 AM

## 2022-07-11 NOTE — Progress Notes (Signed)
PROGRESS NOTE   Subjective/Complaints:   Rectal bleeding MUCH bleeding-  Said stomach feels less distended s/p paracentesis yesterday.   Asking if R BKA looks ok.   ROS:   Pt denies SOB, abd pain, CP, N/V/C/D, and vision changes  Except per HPI Objective:   IR Paracentesis  Result Date: 07/10/2022 INDICATION: Patient with history of cirrhosis, recurrent ascites. Request is made for therapeutic paracentesis. EXAM: ULTRASOUND GUIDED THERAPEUTIC PARACENTESIS MEDICATIONS: 10 mL 1% lidocaine COMPLICATIONS: None immediate. PROCEDURE: Informed written consent was obtained from the patient after a discussion of the risks, benefits and alternatives to treatment. A timeout was performed prior to the initiation of the procedure. Initial ultrasound scanning demonstrates a small amount of ascites within the left lateral abdomen. The left lateral abdomen was prepped and draped in the usual sterile fashion. 1% lidocaine was used for local anesthesia. Following this, a 19 gauge, 7-cm, Yueh catheter was introduced. An ultrasound image was saved for documentation purposes. The paracentesis was performed. The catheter was removed and a dressing was applied. The patient tolerated the procedure well without immediate post procedural complication. FINDINGS: A total of approximately 800 mL of clear, yellow fluid was removed. IMPRESSION: Successful ultrasound-guided paracentesis yielding 800 mL of peritoneal fluid. Read by: Brynda Greathouse PA-C PLAN: If the patient eventually requires >/=2 paracenteses in a 30 day period, candidacy for formal evaluation by the [facility] will be assessed. Electronically Signed   By: Markus Daft M.D.   On: 07/10/2022 17:36   VAS [place] LOWER EXTREMITY VENOUS (DVT)  Result Date: 07/09/2022  Lower Venous DVT Study Patient Name:  Briana Sweeney  Date of Exam:   07/09/2022 Medical  Rec #: [id number]          Accession #:    [id number] Date of Birth: 03/07/1971          Patient Gender: F Patient Age:   51 years Exam Location:  [facility] Procedure:      VAS [place] LOWER EXTREMITY VENOUS (DVT) Referring Phys: Katelynn Heidler --------------------------------------------------------------------------------  Indications: Swelling.  Risk Factors: Surgery. Limitations: Poor ultrasound/tissue interface. Comparison Study: No prior studies. Performing Technologist: Oliver Hum RVT  Examination Guidelines: A complete evaluation includes B-mode imaging, spectral Doppler, color Doppler, and power Doppler as needed of all accessible portions of each vessel. Bilateral testing is considered an integral part of a complete examination. Limited examinations for reoccurring indications may be performed as noted. The reflux portion of the exam is performed with the patient in reverse Trendelenburg.  +-----+---------------+---------+-----------+----------+--------------+ RIGHTCompressibilityPhasicitySpontaneityPropertiesThrombus Aging +-----+---------------+---------+-----------+----------+--------------+ CFV  Full           Yes      Yes                                 +-----+---------------+---------+-----------+----------+--------------+   +---------+---------------+---------+-----------+----------+--------------+ LEFT     CompressibilityPhasicitySpontaneityPropertiesThrombus Aging +---------+---------------+---------+-----------+----------+--------------+ CFV      Full           Yes      Yes                                 +---------+---------------+---------+-----------+----------+--------------+  SFJ      Full                                                        +---------+---------------+---------+-----------+----------+--------------+ FV Prox  Full                                                         +---------+---------------+---------+-----------+----------+--------------+ FV Mid   Full                                                        +---------+---------------+---------+-----------+----------+--------------+ FV DistalFull                                                        +---------+---------------+---------+-----------+----------+--------------+ PFV      Full                                                        +---------+---------------+---------+-----------+----------+--------------+ POP      Full           Yes      Yes                                 +---------+---------------+---------+-----------+----------+--------------+ PTV      Full                                                        +---------+---------------+---------+-----------+----------+--------------+ PERO     Full                                                        +---------+---------------+---------+-----------+----------+--------------+    Summary: RIGHT: - No evidence of common femoral vein obstruction.  LEFT: - There is no evidence of deep vein thrombosis in the lower extremity.  - No cystic structure found in the popliteal fossa.  *See table(s) above for measurements and observations. Electronically signed by Harold Barban MD on 07/09/2022 at 8:49:44 PM.    Final    Recent Labs    07/11/22 0405  WBC 8.4  HGB 11.0*  HCT 34.0*  PLT 144*   Recent Labs    07/11/22 0405  NA 133*  K 3.9  CL 99  CO2 25  GLUCOSE 102*  BUN 10  CREATININE  0.50  CALCIUM 8.0*    Intake/Output Summary (Last 24 hours) at 07/11/2022 0952 Last data filed at 07/11/2022 0749 Gross per 24 hour  Intake 453 ml  Output --  Net 453 ml        Physical Exam: Vital Signs Blood pressure 96/62, pulse 90, temperature 98.4 F (36.9 C), temperature source Oral, resp. rate 16, height '4\' 11"'$  (1.499 m), weight 64.1 kg, SpO2 91 %.     General: awake, alert, appropriate, sitting EOB- easily  moves in bed; NAD HENT: conjugate gaze; oropharynx moist CV: regular rate; no JVD Pulmonary: CTA B/L; no W/R/R- good air movement GI: soft, less distended- but still protuberant; hypoactive BS- not tympanic today Psychiatric: appropriate- flat Neurological: alert- delayed responses  MS: VAC off- limb guard in place- has some dried blood on R BKA- and some small amount of active bleeding- between staples Extremities: No clubbing, cyanosis, or edema Skin: No evidence of breakdown, no evidence of rash Neurologic: Cranial nerves II through XII intact, motor strength is 5/5 in bilateral deltoid, bicep, tricep, grip, B hip flexor,left  knee extensors, ankle dorsiflexor and plantar flexor  Musculoskeletal: Full range of motion in all 4 extremities. No joint swelling RIght BKA wound vac , cannot visualize incision   Assessment/Plan: 1. Functional deficits which require 3+ hours per day of interdisciplinary therapy in a comprehensive inpatient rehab setting. Physiatrist is providing close team supervision and 24 hour management of active medical problems listed below. Physiatrist and rehab team continue to assess barriers to discharge/monitor patient progress toward functional and medical goals  Care Tool:  Bathing    Body parts bathed by patient: Right arm, Left arm, Chest, Abdomen, Front perineal area, Right upper leg, Left upper leg, Left lower leg, Face   Body parts bathed by helper: Buttocks Body parts n/a: Right lower leg   Bathing assist Assist Level: Minimal Assistance - Patient > 75%     Upper Body Dressing/Undressing Upper body dressing   What is the patient wearing?: Pull over shirt, Bra    Upper body assist Assist Level: Supervision/Verbal cueing    Lower Body Dressing/Undressing Lower body dressing      What is the patient wearing?: Underwear/pull up, Pants     Lower body assist Assist for lower body dressing: Moderate Assistance - Patient 50 - 74%      Toileting Toileting    Toileting assist Assist for toileting: Minimal Assistance - Patient > 75%     Transfers Chair/bed transfer  Transfers assist     Chair/bed transfer assist level: Moderate Assistance - Patient 50 - 74%     Locomotion Ambulation   Ambulation assist   Ambulation activity did not occur: Safety/medical concerns (2/2 wound vac on R BKA)          Walk 10 feet activity   Assist  Walk 10 feet activity did not occur: Safety/medical concerns (2/2 wound vac on R BKA)        Walk 50 feet activity   Assist Walk 50 feet with 2 turns activity did not occur: Safety/medical concerns (2/2 wound vac on R BKA)         Walk 150 feet activity   Assist Walk 150 feet activity did not occur: Safety/medical concerns (2/2 wound vac on R BKA)         Walk 10 feet on uneven surface  activity   Assist Walk 10 feet on uneven surfaces activity did not occur: Safety/medical concerns (2/2 wound vac on R  BKA)         Wheelchair     Assist Is the patient using a wheelchair?: Yes Type of Wheelchair: Manual    Wheelchair assist level: Minimal Assistance - Patient > 75%      Wheelchair 50 feet with 2 turns activity    Assist            Wheelchair 150 feet activity     Assist          Blood pressure 96/62, pulse 90, temperature 98.4 F (36.9 C), temperature source Oral, resp. rate 16, height '4\' 11"'$  (1.499 m), weight 64.1 kg, SpO2 91 %.  Medical Problem List and Plan: 1. Functional deficits secondary to right BKA             -patient may shower with tubing clamped, vac disconnected. Do not submerge.              -ELOS/Goals: 10-14 days             - Lives alone in handicapped accessible 1 floor apartment    D/c date 11/13  Con't CIR- PT, OT and SLP 2.  Antithrombotics: -DVT/anticoagulation:  Pharmaceutical: Lovenox             -antiplatelet therapy: none             -LUE duplex ordered on admission for edema   10/31-  will order LLE Dopplers to rule out DVT since has abrupt onset/worsening LLE edema  11/1- Dopplers (-)- done today- due to continuing bleeding/hemorrhoids and hx fof frequent plt transfusions, will stop lovenox per pt request 3. Pain Management: Tylenol, Robaxin, Dilaudid 4 mg q 4 hours as needed   10/30- will add gabapentin 300 mg BID- goal to get off pain meds by d/c that are controlled.   10/31- still having pain, but explained that phantom pain doesn't respond to opiates- it responds to nerve pain meds- said has tolerated gabapentin in past  11/1- increased gabapentin to 300 mg QID 4. Mood/Behavior/Sleep: LCSW to evaluate and provide emotional support             -h/o bipolar>>continue Abilify, Prozac             -h/o anxiety>>continue clonazepam 0.5 mg BID as needed             -antipsychotic agents: n/a   5. Neuropsych/cognition: This patient is capable of making decisions on her own behalf.             -SLP eval for difficulty with higher cognitive tasks   11/1- has higher level cognitive issues- will need assist for money/med mgmt. [facility] [place] 6. Skin/Wound Care: Routine skin care checks             -  maintain negative pressure dressing dressing through next Tuesday 10/31             - Wound vac per ortho             10/31- will write for [facility] removal  11/1- removed- has two new small sacral spots 7. 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 Juleen China: At discharge from rehab, DC PICC after 1x dose of oritavancin; then transition to oral linezolid to complete course at 67/12   9: Alcoholic cirrhosis/ascites/elevated LFTs/esophageal varices:             -continue Lasix 40 mg daily             -continue lactulose 20 grams BID  (goal 2-3 bowel movements daily)             -continue nadolol 20 mg daily             -continue aldactone 100 mg daily             -continue Folvite, thiamine supplementation             -Gets OP Qmonthly ascites taps; will need to arrange with IR while admitted   10/30- having 4-7 BM's day will need ot check with next paracentesis is needed  11/1- will get paracentesis today- got out 800cc 10: Seizure disorder: continue Keppra   11. History polysubstance abuse            - Hx alcohol, THC - none current per patient            - Plan to DC picc prior to discharge as above   12. AKI on CKD stage 3. Admission labs. Baseline Cr Appears 0.4-0.7.       Latest Ref Rng & Units 07/11/2022    4:05 AM 07/08/2022    4:19 AM 07/06/2022    2:08 AM  BMP  Glucose 70 - 99 mg/dL 102  129  104   BUN 6 - 20 mg/dL '10  10  13   '$ Creatinine 0.44 - 1.00 mg/dL 0.50  0.51  0.55   Sodium 135 - 145 mmol/L 133  134  132   Potassium 3.5 - 5.1 mmol/L 3.9  3.4  3.9   Chloride 98 - 111 mmol/L 99  100  94   CO2 22 - 32 mmol/L '25  27  29   '$ Calcium 8.9 - 10.3 mg/dL 8.0  8.3  8.3     AKI resolved   13.  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   11/1- up to 300 mg QID 15. Hypokalemia  10/30- will replete 40 mEq x1 and recheck later in week  11/2- K+  3.9 16. LLE edema  10/31- will get Dopplers of LLE since has edema and has missed multiple doses of lovenox- due to refusal- also ordered TEDs for LLE  11/1- no DVT 17. Cough  10/31- ordered Tessalon pearles TID  prn for cough- already has robitussin prn.  18. Anal bleeding/hemorrhoids (pt said she 11/1 - started anusol suppository, cream and witch hazel pads.  11/2- no bleeding in last 12+ hours-    I spent  a total of  35  minutes on total care today- >50% coordination of care- due to review of chart, getting nursing to help be change bandage, and getting pics  LOS: 6 days A FACE TO FACE EVALUATION WAS PERFORMED  Briana Sweeney 07/11/2022, 9:52 AM

## 2022-07-12 DIAGNOSIS — M869 Osteomyelitis, unspecified: Secondary | ICD-10-CM | POA: Diagnosis not present

## 2022-07-12 MED ORDER — GABAPENTIN 300 MG PO CAPS
600.0000 mg | ORAL_CAPSULE | Freq: Three times a day (TID) | ORAL | Status: DC
  Administered 2022-07-12 – 2022-07-22 (×30): 600 mg via ORAL
  Filled 2022-07-12 (×30): qty 2

## 2022-07-12 NOTE — Progress Notes (Signed)
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   "Hurts real bad".  Doesn't want cream on it, but agrees to nonadherent dressing.   Nerve pain/phantom pain still a big issue.  Went over that will decrease dilaudid next week, with goal of stopping by d/c.   ROS:    Pt denies SOB, abd pain, CP, N/V/C/D, and vision changes   Except per HPI Objective:   IR Paracentesis  Result Date: 07/10/2022 INDICATION: Patient with history of cirrhosis, recurrent ascites. Request is made for therapeutic paracentesis. EXAM: ULTRASOUND GUIDED THERAPEUTIC PARACENTESIS MEDICATIONS: 10 mL 1% lidocaine COMPLICATIONS: None immediate. PROCEDURE: Informed written consent was obtained from the patient after a discussion of the risks, benefits and alternatives to treatment. A timeout was performed prior to the initiation of the procedure. Initial ultrasound scanning demonstrates a small amount of ascites within the left lateral abdomen. The left lateral abdomen was prepped and draped in the usual sterile fashion. 1% lidocaine was used for local anesthesia. Following this, a 19 gauge, 7-cm, Yueh catheter was introduced. An ultrasound image was saved for documentation purposes. The paracentesis was performed. The catheter was removed and a dressing was applied. The patient tolerated the procedure well without immediate post procedural complication. FINDINGS: A total of approximately 800 mL of clear, yellow fluid was removed. IMPRESSION: Successful ultrasound-guided paracentesis yielding 800 mL of peritoneal fluid. Read by: Brynda Greathouse PA-C PLAN: If the patient eventually requires >/=2 paracenteses in a 30 day period, candidacy for formal evaluation by the [facility] will be  assessed. Electronically Signed   By: Markus Daft M.D.   On: 07/10/2022 17:36   Recent Labs    07/11/22 0405  WBC 8.4  HGB 11.0*  HCT 34.0*  PLT 144*   Recent Labs    07/11/22 0405  NA 133*  K 3.9  CL 99  CO2 25  GLUCOSE 102*  BUN 10  CREATININE 0.50  CALCIUM 8.0*    Intake/Output Summary (Last 24 hours) at 07/12/2022 0925 Last data filed at 07/12/2022 0700 Gross per 24 hour  Intake 403 ml  Output --  Net 403 ml        Physical Exam: Vital Signs Blood pressure 103/64, pulse 85, temperature 98.3 F (36.8 C), temperature source Oral, resp. rate 20, height '4\' 11"'$  (1.499 m), weight 64.7 kg, SpO2 93 %.      General: awake, alert, appropriate, sitting EOB; nursing joined in room; NAD HENT: conjugate gaze; oropharynx moist- has permanent eyeliner CV: regular rate; no JVD Pulmonary: CTA B/L; no W/R/R- good air movement GI: soft, NT, ND, (+)BS- less protuberant;  Psychiatric: appropriate Neurological: alert MS: VAC off- limb guard in place- has some dried blood on R BKA- and some small amount of active bleeding- between staples Extremities: No clubbing, cyanosis, or edema Skin: No evidence of breakdown, no evidence of rash Neurologic: Cranial nerves II through XII intact, motor strength is 5/5 in bilateral deltoid, bicep, tricep, grip, B hip flexor,left  knee extensors, ankle dorsiflexor and  plantar flexor  Musculoskeletal: Full range of motion in all 4 extremities. No joint swelling RIght BKA wound vac , cannot visualize incision   Assessment/Plan: 1. Functional deficits which require 3+ hours per day of interdisciplinary therapy in a comprehensive inpatient rehab setting. Physiatrist is providing close team supervision and 24 hour management of active medical problems listed below. Physiatrist and rehab team continue to assess barriers to discharge/monitor patient progress toward functional and medical goals  Care Tool:  Bathing    Body parts bathed by patient:  Right arm, Left arm, Chest, Abdomen, Front perineal area, Right upper leg, Left upper leg, Left lower leg, Face   Body parts bathed by helper: Buttocks Body parts n/a: Right lower leg   Bathing assist Assist Level: Minimal Assistance - Patient > 75%     Upper Body Dressing/Undressing Upper body dressing   What is the patient wearing?: Pull over shirt, Bra    Upper body assist Assist Level: Set up assist    Lower Body Dressing/Undressing Lower body dressing      What is the patient wearing?: Underwear/pull up, Pants     Lower body assist Assist for lower body dressing: Moderate Assistance - Patient 50 - 74%     Toileting Toileting    Toileting assist Assist for toileting: Set up assist Assistive Device Comment: walker, bsc   Transfers Chair/bed transfer  Transfers assist     Chair/bed transfer assist level: Moderate Assistance - Patient 50 - 74%     Locomotion Ambulation   Ambulation assist   Ambulation activity did not occur: Safety/medical concerns (2/2 wound vac on R BKA)          Walk 10 feet activity   Assist  Walk 10 feet activity did not occur: Safety/medical concerns (2/2 wound vac on R BKA)        Walk 50 feet activity   Assist Walk 50 feet with 2 turns activity did not occur: Safety/medical concerns (2/2 wound vac on R BKA)         Walk 150 feet activity   Assist Walk 150 feet activity did not occur: Safety/medical concerns (2/2 wound vac on R BKA)         Walk 10 feet on uneven surface  activity   Assist Walk 10 feet on uneven surfaces activity did not occur: Safety/medical concerns (2/2 wound vac on R BKA)         Wheelchair     Assist Is the patient using a wheelchair?: Yes Type of Wheelchair: Manual    Wheelchair assist level: Minimal Assistance - Patient > 75%      Wheelchair 50 feet with 2 turns activity    Assist            Wheelchair 150 feet activity     Assist          Blood  pressure 103/64, pulse 85, temperature 98.3 F (36.8 C), temperature source Oral, resp. rate 20, height '4\' 11"'$  (1.499 m), weight 64.7 kg, SpO2 93 %.  Medical Problem List and Plan: 1. Functional deficits secondary to right BKA             -patient may shower with tubing clamped, vac disconnected. Do not submerge.              -ELOS/Goals: 10-14 days             - Lives alone in handicapped accessible 1 floor apartment    D/c date 11/13  Con't CIR- PT, OT and SLP 2.  Antithrombotics: -DVT/anticoagulation:  Pharmaceutical: Lovenox             -antiplatelet therapy: none             -LUE duplex ordered on admission for edema   10/31- will order LLE Dopplers to rule out DVT since has abrupt onset/worsening LLE edema  11/1- Dopplers (-)- done today- due to continuing bleeding/hemorrhoids and hx fof frequent plt transfusions, will stop lovenox per pt request 3. Pain Management: Tylenol, Robaxin, Dilaudid 4 mg q 4 hours as needed   10/30- will add gabapentin 300 mg BID- goal to get off pain meds by d/c that are controlled.   10/31- still having pain, but explained that phantom pain doesn't respond to opiates- it responds to nerve pain meds- said has tolerated gabapentin in past  11/1- increased gabapentin to 300 mg QID  11/3- increased gabapentin to 600 mg TID- will look at weaning dilaudid next week 4. Mood/Behavior/Sleep: LCSW to evaluate and provide emotional support             -h/o bipolar>>continue Abilify, Prozac             -h/o anxiety>>continue clonazepam 0.5 mg BID as needed             -antipsychotic agents: n/a   5. Neuropsych/cognition: This patient is capable of making decisions on her own behalf.             -SLP eval for difficulty with higher cognitive tasks   11/1- has higher level cognitive issues- will need assist for money/med mgmt. [facility] [place] 6. Skin/Wound Care: Routine skin care checks             -  maintain negative pressure dressing  dressing through next Tuesday 10/31             - Wound vac per ortho             10/31- will write for [facility] removal  11/1- removed- has two new small sacral spots  11/3- has pain back of leg where VAC removed- think top layer of skin got pulled off with VAC- will place nonadherent dressing 7. 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 Juleen China: At discharge from rehab, DC PICC after 1x dose of oritavancin; then transition to oral linezolid to complete course at 56/31   9: Alcoholic cirrhosis/ascites/elevated LFTs/esophageal varices:             -continue Lasix 40 mg daily             -continue lactulose 20 grams BID (goal 2-3 bowel movements daily)             -continue nadolol 20 mg daily             -continue aldactone 100 mg daily             -continue Folvite, thiamine supplementation             -Gets OP Qmonthly ascites taps; will need to arrange with IR while admitted  10/30- having 4-7 BM's day will need ot check with next paracentesis is needed  11/1- will get paracentesis today- got out 800cc  11/3- looks better 10: Seizure disorder: continue Keppra   11. History polysubstance abuse            - Hx alcohol, THC - none current per patient            - Plan to DC picc prior to discharge as above   12. AKI on CKD stage 3. Admission labs. Baseline Cr Appears 0.4-0.7.       Latest Ref Rng & Units 07/11/2022    4:05 AM 07/08/2022    4:19 AM 07/06/2022    2:08 AM  BMP  Glucose 70 - 99 mg/dL 102  129  104   BUN 6 - 20 mg/dL '10  10  13   '$ Creatinine 0.44 - 1.00 mg/dL 0.50  0.51  0.55   Sodium 135 - 145 mmol/L 133  134  132   Potassium 3.5 - 5.1 mmol/L 3.9  3.4  3.9   Chloride 98 - 111 mmol/L 99  100  94   CO2 22 - 32 mmol/L '25  27  29   '$ Calcium  8.9 - 10.3 mg/dL 8.0  8.3  8.3     AKI resolved   13.  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   11/1- up to 300 mg QID  11/3- increase to 600 mg TID 15. Hypokalemia  10/30- will replete 40 mEq x1 and recheck later in week  11/2- K+  3.9 16. LLE edema  10/31- will get Dopplers of LLE since has edema and has missed multiple doses of lovenox- due to refusal- also ordered TEDs for LLE  11/1- no DVT 17. Cough  10/31- ordered Tessalon pearles TID prn for cough- already has robitussin prn.  18. Anal bleeding/hemorrhoids (pt said she 11/1 - started anusol suppository, cream and witch hazel pads.  11/2- no bleeding in last 12+ hours-    I spent a total of 41   minutes on total care today- >50% coordination of care- due to d/w nursing x2 about pain meds- and charge nurse about nonadherent dressing.   LOS: 7 days A FACE TO FACE EVALUATION WAS PERFORMED  Briana Sweeney 07/12/2022, 9:25 AM

## 2022-07-12 NOTE — Progress Notes (Signed)
Physical Therapy Session Note  Patient Details  Name: Briana Sweeney MRN: [id number] Date of Birth: Dec 07, 1970  Today's Date: 07/12/2022 PT Individual Time: 0915-1000 PT Individual Time Calculation (min): 45 min   Short Term Goals: Week 1:  PT Short Term Goal 1 (Week 1): STG = LTG 2/2 LOS  Skilled Therapeutic Interventions/Progress Updates: Pt presents at sink in w/c and agreeable to therapy.  Pt wheeled to small gym for time conservation.  Pt performed multiple lateral scoot transfers w/c <> Mat table w/ CGA to supervision.  Pt requires A for managing arm rest, but brakes applied and removed w/ directions.  Pt doffed limb protector w/ min A.  Pt performed LAQ, hip flexion, isometric add 3 x 10 w/ 2# weight to LLE.  Pt performed overhead press w/ 2# weighted bar 3 x 10.  Pt rolled to prone for increased hip flexor stretch w/o c/o.  Pt returned to w/c w/ supervision and returned to room.  Pt transferred w/c > bed w/ supervision and then to supine.  Bed alarm on and all needs in reach.     Therapy Documentation Precautions:  Precautions Precautions: Fall Precaution Comments: Reviewed verbally Restrictions Weight Bearing Restrictions: Yes RLE Weight Bearing: Non weight bearing General:   Vital Signs:   Pain:9/10  Pain Assessment Pain Score: 5  Mobility:      Therapy/Group: Individual Therapy  [name] 07/12/2022, 12:17 PM

## 2022-07-12 NOTE — Progress Notes (Signed)
Occupational Therapy Session Note  Patient Details  Name: Briana Sweeney MRN: [id number] Date of Birth: [birth year]  Today's Date: 07/12/2022 OT Individual Time: 4098-1191 OT Individual Time Calculation (min): 55 min    Short Term Goals: Week 1:  OT Short Term Goal 1 (Week 1): Pt will complete toilet transfers with supervision OT Short Term Goal 2 (Week 1): Pt will complete 2/3 components of toileting with supervision OT Short Term Goal 3 (Week 1): Pt will don LB clothing with CGA at sit to stand level OT Short Term Goal 4 (Week 1): Pt will initate IADL retraining in prep for living alone  Skilled Therapeutic Interventions/Progress Updates:    OT intervention with focus on bed mobility, functional transfers with RW, standing balance, and discharge planning. Pt reports she has already practiced TTB tranfers. W/c mobility in hallway and ADL apartment with supervision. Stand pivot transfer with RW w/c>std bed in ADL apt with CGA and max verbal cues for safety. Standing balance in gym with supervision to complete reaching tasks on card board. Pt returned to room and performed squat pivot transfer to EOB with supervision. Pt remained in bed with all needs within reach. Bed alarm activated.   Therapy Documentation Precautions:  Precautions Precautions: Fall Precaution Comments: Reviewed verbally Restrictions Weight Bearing Restrictions: Yes RLE Weight Bearing: Non weight bearing :   Pain: Pt reports discomfort posterior RLE where wound vac removed; RN aware   Therapy/Group: Individual Therapy  Leroy Libman 07/12/2022, 11:34 AM

## 2022-07-12 NOTE — Progress Notes (Signed)
Occupational Therapy Session Note  Patient Details  Name: Doneen Ollinger MRN: [id number] Date of Birth: Jan 25, 1971  Today's Date: 07/12/2022 OT Individual Time: 1300-1330 OT Individual Time Calculation (min): 30 min    Short Term Goals: Week 2:  OT Short Term Goal 1 (Week 2): Pt will perform shower seated with grab br support for breif stanidng with mod I, AE/DME OT Short Term Goal 2 (Week 2): Pt will stand for counter level task in kitchen for up to 2-3 minutes with support and mod I  Skilled Therapeutic Interventions/Progress Updates:    Upon OT arrival, pt seated EOB with alarm on. Pt agreeable to OT treatment. Treatment intervention with a focus on UE strengthening, endurance, w/c mobility, and navigation. Pt completes stand pivot transfer with RW and SBA to w/c. Pt propels herself to elevator, manages elevator, and determines where [employer] is using hospital guide in elevator to transport herself to location. Pt requires increased time to complete task and short rest breaks. Pt's friend arrived during session and pt able to carry a conversation with friend while also correctly locating [employer]. Longer rest break at end of task required. Pt was then transported back to her room via w/c and Total A secondary to time constraints and was left in w/c at end of session with all needs met and friends present in room.  Therapy Documentation Precautions:  Precautions Precautions: Fall Precaution Comments: Reviewed verbally Restrictions Weight Bearing Restrictions: Yes RLE Weight Bearing: Non weight bearing    Therapy/Group: Individual Therapy  Marvetta Gibbons 07/12/2022, 5:19 PM

## 2022-07-12 NOTE — Progress Notes (Signed)
Occupational Therapy Weekly Progress Note  Patient Details  Name: Briana Sweeney MRN: [id number] Date of Birth: [birth year]  Beginning of progress report period: July 06, 2022 End of progress report period: July 12, 2022  Today's Date: 07/12/2022 OT Individual Time: 1415-1500 OT Individual Time Calculation (min): 45 min    Patient has met 1 of 4 short term goals however is close to STG's met. OT and pt collaborated for additional goals for upcoming week to progress standing supported for ADL's and light IADL's, increased shower skills and overall activity tolerance. Pt will wound vac d/c'd and improved GI issues. Progressing B UE HEP to increase strength for w/c and transfer skills and safe functional readiness for d/c in 2 weeks.   Patient continues to demonstrate the following deficits: muscle weakness, decreased coordination and decreased motor planning, decreased problem solving, decreased safety awareness, decreased memory, and delayed processing, and decreased standing balance and decreased balance strategies and therefore will continue to benefit from skilled OT intervention to enhance overall performance with BADL, iADL, and Reduce care partner burden.  Patient progressing toward long term goals..  Continue plan of care.  OT Short Term Goals Week 1:  OT Short Term Goal 1 (Week 1): Pt will complete toilet transfers with supervision OT Short Term Goal 1 - Progress (Week 1): Progressing toward goal OT Short Term Goal 2 (Week 1): Pt will complete 2/3 components of toileting with supervision OT Short Term Goal 2 - Progress (Week 1): Met OT Short Term Goal 3 (Week 1): Pt will don LB clothing with CGA at sit to stand level OT Short Term Goal 3 - Progress (Week 1): Progressing toward goal OT Short Term Goal 4 (Week 1): Pt will initate IADL retraining in prep for living alone OT Short Term Goal 4 - Progress (Week 1): Progressing toward goal Week 2:  OT Short Term Goal 1 (Week 2): Pt  will perform shower seated with grab br support for breif stanidng with mod I, AE/DME OT Short Term Goal 2 (Week 2): Pt will stand for counter level task in kitchen for up to 2-3 minutes with support and mod I  Skilled Therapeutic Interventions/Progress Updates:   Pt seen for skilled OT this pm. Requesting toileting upon OT arrival. Pt required min A to navigate from bedside to and from toilet in w/c. Mod cues for brakes and leg rest mngt. Transfer on and off toilet with min A/CGA. Min A clothing mngt for lower body. S for toileting hygiene post BM and voiding. Stood sink side with RW up agains sink with min A for hand washing. OT progressed B UE HEP 15 reps x 2 sets for medium (red) tband for scap, sh, elbow with min cues and demo. Pt requested transfer back to bed with CGA and OT ended session with bed alarm set, needs and nurse call button in reach.   Therapy Documentation Precautions:  Precautions Precautions: Fall Precaution Comments: Reviewed verbally Restrictions Weight Bearing Restrictions: Yes RLE Weight Bearing: Non weight bearing    Therapy/Group: Individual Therapy  Barnabas Lister 07/12/2022, 4:10 PM

## 2022-07-13 DIAGNOSIS — E876 Hypokalemia: Secondary | ICD-10-CM

## 2022-07-13 DIAGNOSIS — M86271 Subacute osteomyelitis, right ankle and foot: Secondary | ICD-10-CM | POA: Diagnosis not present

## 2022-07-13 DIAGNOSIS — G8918 Other acute postprocedural pain: Secondary | ICD-10-CM

## 2022-07-13 DIAGNOSIS — Z89511 Acquired absence of right leg below knee: Secondary | ICD-10-CM | POA: Diagnosis not present

## 2022-07-13 LAB — GLUCOSE, CAPILLARY: Glucose-Capillary: 85 mg/dL (ref 70–99)

## 2022-07-13 NOTE — Progress Notes (Signed)
Occupational Therapy Session Note  Patient Details  Name: Briana Sweeney MRN: [id number] Date of Birth: 15-May-1971  Today's Date: 07/13/2022 OT Individual Time: 5789-7847 OT Individual Time Calculation (min): 60 min    Short Term Goals: Week 2:  OT Short Term Goal 1 (Week 2): Pt will perform shower seated with grab br support for breif stanidng with mod I, AE/DME OT Short Term Goal 2 (Week 2): Pt will stand for counter level task in kitchen for up to 2-3 minutes with support and mod I  Skilled Therapeutic Interventions/Progress Updates:    Upon OT arrival, pt semi recumbent in bed on the phone with her daughter. Pt agreeable to OT treatment and reports 7/10 pain. Treatment intervention with a focus on self care retraining. Pt completes full shower ADL at the levels below. Pt noted to require verbal cues for safety awareness to lock breaks before transfers and requires verbal cues for problem solving and sequencing. Pt requesting to blow dry her hair and completes herself seated at sink. Pt performs functional transfers with CGA and stands with CGA. Pt was left in the w/c at end of session with all needs met and safety measures in place.   Therapy Documentation Precautions:  Precautions Precautions: Fall Precaution Comments: Reviewed verbally Restrictions Weight Bearing Restrictions: Yes RLE Weight Bearing: Non weight bearing   ADL: ADL Grooming: Setup Where Assessed-Grooming: Sitting at sink Upper Body Bathing: Supervision/safety Where Assessed-Upper Body Bathing: Shower Lower Body Bathing: Contact guard Where Assessed-Lower Body Bathing: Shower Upper Body Dressing: Supervision/safety Where Assessed-Upper Body Dressing: Edge of bed Lower Body Dressing: Minimal assistance (to doff/donn TED hose) Where Assessed-Lower Body Dressing: Bed level, Other (Comment), Edge of bed (toilet) Toileting: Contact guard Where Assessed-Toileting: Glass blower/designer: Arts administrator Method: Arts development officer: Energy manager Method: Radiographer, therapeutic: Shower seat with back    Therapy/Group: Individual Therapy  Marvetta Gibbons 07/13/2022, 3:40 PM

## 2022-07-13 NOTE — Progress Notes (Signed)
PROGRESS NOTE   Subjective/Complaints:  Right bka more tender since vac came off. Also stools are loose and wants to see if we can stop lactulose  ROS: Patient denies fever, rash, sore throat, blurred vision, dizziness, nausea, vomiting, diarrhea, cough, shortness of breath or chest pain,  headache, or mood change.      Objective:   No results found. Recent Labs    07/11/22 0405  WBC 8.4  HGB 11.0*  HCT 34.0*  PLT 144*   Recent Labs    07/11/22 0405  NA 133*  K 3.9  CL 99  CO2 25  GLUCOSE 102*  BUN 10  CREATININE 0.50  CALCIUM 8.0*    Intake/Output Summary (Last 24 hours) at 07/13/2022 0910 Last data filed at 07/13/2022 0819 Gross per 24 hour  Intake 764 ml  Output --  Net 764 ml        Physical Exam: Vital Signs Blood pressure 94/65, pulse 96, temperature 98.7 F (37.1 C), temperature source Oral, resp. rate 18, height '4\' 11"'$  (1.499 m), weight 62.8 kg, SpO2 97 %.      Constitutional: No distress . Vital signs reviewed. HEENT: NCAT, EOMI, oral membranes moist Neck: supple Cardiovascular: RRR without murmur. No JVD    Respiratory/Chest: CTA Bilaterally without wheezes or rales. Normal effort    GI/Abdomen: BS +, non-tender, non-distended Ext: no clubbing, cyanosis, or edema Psych: pleasant and cooperative  MS: right stump tender, edematous Extremities: No clubbing, cyanosis Skin: incision CDI with staples, minimal s/s discharge--shrinker in place Neurologic: Cranial nerves II through XII intact, motor strength is 5/5 in bilateral deltoid, bicep, tricep, grip, B hip flexor,left  knee extensors, ankle dorsiflexor and plantar flexor  Musculoskeletal: Full range of motion in all 4 extremities. No joint swelling    Assessment/Plan: 1. Functional deficits which require 3+ hours per day of interdisciplinary therapy in a comprehensive inpatient rehab setting. Physiatrist is providing close team  supervision and 24 hour management of active medical problems listed below. Physiatrist and rehab team continue to assess barriers to discharge/monitor patient progress toward functional and medical goals  Care Tool:  Bathing    Body parts bathed by patient: Right arm, Left arm, Chest, Abdomen, Front perineal area, Right upper leg, Left upper leg, Left lower leg, Face   Body parts bathed by helper: Buttocks Body parts n/a: Right lower leg   Bathing assist Assist Level: Minimal Assistance - Patient > 75%     Upper Body Dressing/Undressing Upper body dressing   What is the patient wearing?: Pull over shirt, Bra    Upper body assist Assist Level: Set up assist    Lower Body Dressing/Undressing Lower body dressing      What is the patient wearing?: Underwear/pull up, Pants     Lower body assist Assist for lower body dressing: Moderate Assistance - Patient 50 - 74%     Toileting Toileting    Toileting assist Assist for toileting: Set up assist Assistive Device Comment: walker, bsc   Transfers Chair/bed transfer  Transfers assist     Chair/bed transfer assist level: Contact Guard/Touching assist     Locomotion Ambulation   Ambulation assist   Ambulation activity  did not occur: Safety/medical concerns (2/2 wound vac on R BKA)          Walk 10 feet activity   Assist  Walk 10 feet activity did not occur: Safety/medical concerns (2/2 wound vac on R BKA)        Walk 50 feet activity   Assist Walk 50 feet with 2 turns activity did not occur: Safety/medical concerns (2/2 wound vac on R BKA)         Walk 150 feet activity   Assist Walk 150 feet activity did not occur: Safety/medical concerns (2/2 wound vac on R BKA)         Walk 10 feet on uneven surface  activity   Assist Walk 10 feet on uneven surfaces activity did not occur: Safety/medical concerns (2/2 wound vac on R BKA)         Wheelchair     Assist Is the patient using a  wheelchair?: Yes Type of Wheelchair: Manual    Wheelchair assist level: Minimal Assistance - Patient > 75%      Wheelchair 50 feet with 2 turns activity    Assist            Wheelchair 150 feet activity     Assist          Blood pressure 94/65, pulse 96, temperature 98.7 F (37.1 C), temperature source Oral, resp. rate 18, height '4\' 11"'$  (1.499 m), weight 62.8 kg, SpO2 97 %.  Medical Problem List and Plan: 1. Functional deficits secondary to right BKA             -patient may shower with tubing clamped, vac disconnected. Do not submerge.              -ELOS/Goals: 10-14 days             - Lives alone in handicapped accessible 1 floor apartment    D/c date 11/13  -Continue CIR therapies including PT, OT, and SLP  2.  Antithrombotics: -DVT/anticoagulation:  Pharmaceutical: Lovenox             -antiplatelet therapy: none             -LUE duplex ordered on admission for edema   10/31- will order LLE Dopplers to rule out DVT since has abrupt onset/worsening LLE edema  11/1- Dopplers (-)- done today- due to continuing bleeding/hemorrhoids and hx fof frequent plt transfusions, will stop lovenox per pt request 3. Pain Management: Tylenol, Robaxin, Dilaudid 4 mg q 4 hours as needed   10/30- will add gabapentin 300 mg BID- goal to get off pain meds by d/c that are controlled.   10/31- still having pain, but explained that phantom pain doesn't respond to opiates- it responds to nerve pain meds- said has tolerated gabapentin in past  11/1- increased gabapentin to 300 mg QID  11/3- increased gabapentin to 600 mg TID- will look at weaning dilaudid next week  11/4 encouraged pt to massage, rub stump. Continue meds as above 4. Mood/Behavior/Sleep: LCSW to evaluate and provide emotional support             -h/o bipolar>>continue Abilify, Prozac             -h/o anxiety>>continue clonazepam 0.5 mg BID as needed             -antipsychotic agents: n/a   5. Neuropsych/cognition:  This patient is capable of making decisions on her own behalf.             -  SLP eval for difficulty with higher cognitive tasks   11/1- has higher level cognitive issues- will need assist for money/med mgmt. [facility] [place] 6. Skin/Wound Care: Routine skin care checks             -  maintain negative pressure dressing dressing through next Tuesday 10/31             - Wound vac per ortho             10/31- will write for [facility] removal  11/1- removed- has two new small sacral spots  11/4- wound looks ok. Can use simply silver infused shrinker per Dr. Sharol Given protocol.  7. 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 Juleen China: At discharge from rehab, DC PICC after 1x dose of oritavancin; then transition to oral linezolid to complete course at 33/29   9: Alcoholic cirrhosis/ascites/elevated LFTs/esophageal varices:             -continue Lasix 40 mg daily             -continue lactulose 20 grams BID (goal 2-3 bowel movements daily)             -continue nadolol 20 mg daily             -continue aldactone 100 mg daily             -continue Folvite, thiamine supplementation             -Gets OP Qmonthly ascites taps; will need to arrange with IR while admitted   10/30- having 4-7 BM's day will need ot check with next paracentesis is needed  11/1- will get paracentesis today- got out 800cc  11/3- looks better 10: Seizure disorder: continue Keppra   11. History polysubstance abuse            - Hx alcohol, THC - none current per patient            - Plan to DC picc prior to discharge as above   12. AKI on CKD stage 3. Admission labs. Baseline Cr Appears 0.4-0.7.       Latest Ref Rng & Units 07/11/2022    4:05 AM 07/08/2022    4:19 AM 07/06/2022     2:08 AM  BMP  Glucose 70 - 99 mg/dL 102  129  104   BUN 6 - 20 mg/dL '10  10  13   '$ Creatinine 0.44 - 1.00 mg/dL 0.50  0.51  0.55   Sodium 135 - 145 mmol/L 133  134  132   Potassium 3.5 - 5.1 mmol/L 3.9  3.4  3.9   Chloride 98 - 111 mmol/L 99  100  94   CO2 22 - 32 mmol/L '25  27  29   '$ Calcium 8.9 - 10.3 mg/dL 8.0  8.3  8.3     AKI resolved   13.  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   11/1- up to 300 mg QID  11/3- increase to 600 mg TID  11/4 see pain discussion above in #3 15. Hypokalemia  10/30- will replete 40 mEq x1 and recheck later in week  11/2- K+  3.9 16. LLE edema  10/31- will get Dopplers of LLE since has edema and has missed multiple doses of lovenox- due to refusal- also ordered TEDs for LLE  11/1- no DVT 17. Cough  10/31- ordered Tessalon pearles TID prn for cough- already has robitussin prn.  18. Anal bleeding/hemorrhoids (pt said she 11/1 - started anusol suppository, cream and witch hazel pads.  11/2- no bleeding in last 12+ hours-     .   LOS: 8 days A FACE TO FACE EVALUATION WAS PERFORMED  Meredith Staggers 07/13/2022, 9:10 AM

## 2022-07-13 NOTE — Progress Notes (Signed)
Physical Therapy Session Note  Patient Details  Name: Briana Sweeney MRN: [id number] Date of Birth: Sep 16, 1970  Today's Date: 07/13/2022 PT Individual Time: 0950-1049 PT Individual Time Calculation (min): 59 min   Short Term Goals: Week 1:  PT Short Term Goal 1 (Week 1): STG = LTG 2/2 LOS  Skilled Therapeutic Interventions/Progress Updates:    Pt received sitting on EOB with NT present and pt having just completed toileting. Pt agreeable to therapy session, but reporting increased pain in R LE rated as 8/10. Nurse notified and present for medication administration. Sitting EOB, pt donned pants and R LE limb guard set-up assist and min assist to problem solve donning limb guard over pants and lining up straps. Sitting EOB, donned UB clothing set-up assist with pt having poor awareness of when her bra strap was twisted when fastening it, no sitting balance instability noted.   Sit>stand EOB>RW with CGA for safety while pt pulled pants up over hips without assist. R squat pivot transfer EOB>w/c with CGA/supervision for safety.   B UE w/c mobility to sink in room with supervision. Pt sitting in w/c at sink performed oral care and washed her face with set-up assist but requires max assist to put hair into clip due to pt being unaware that she did not have all of her hair gathered together.   Pt reports not feeling well this AM from not sleeping well and having frequent BMs last night.   Discussed D/C plan with pt reporting she is going home alone and stating she plans to use w/c and RW, specifically using the RW to get to the w/c. Pt appears to have some decreased awareness and impaired problem solving when discussing mobility at home - focused on implementing education during session for hopefully improved carryover at D/C.  B UE w/c propulsion ~158f to main therapy gym with supervision and pt reporting UE fatigue ~75% of the distance but able to continue - slow propulsion speed.  Donned L LE  knee high TED hose total assist for edema management with pt having significant swelling in her foot.  Inquired to pt how she could transfer w/c>EOM and pt stated she needed the RW - asked pt how she was going to get the RW by herself, pt attempted to move AD to where she needed it from w/c level with pt having difficulty managing RW and propelling w/c at same time. Therapist then inquired if there is a different technique she could use to get to the mat without using the RW and pt able to recall squat pivot transfer.  Pt started to set-up w/c for squat pivot and was going to start the transfer prior to locking the brakes, moving the w/c leg rests, or flipping back the armrest - therapist had to provide max step-by-step cuing for safe set-up of the transfer.  Pt then completed squat pivot w/c<>EOM with supervision.   Had pt put w/c back together and perform B UE w/c propulsion to next mat and she was able to demo 100% carryover of education for setting up w/c to perform squat pivot to this mat, without cuing. Squat pivot w/c<>EOM with supervision.   Sit<>stands EOM<>RW with CGA for safety but no significant instability noted. Standing with B UE support on RW performed R LE hip abduction and hip extension x20 reps each with cuing to maintain upright posture.  Transported pt back to her room with her reporting fatigue. Pt able to set-up w/c for squat pivot to EOB  with only min cuing to ensure w/c as close as possible to bed but otherwise pt demoing carryover from above education. Pt requesting to leave limb guard in place at this time.  Sit>supine mod-I. Pt left supine in bed with needs in reach and bed alarm on.    Therapy Documentation Precautions:  Precautions Precautions: Fall Precaution Comments: Reviewed verbally Restrictions Weight Bearing Restrictions: Yes RLE Weight Bearing: Non weight bearing   Pain:  R LE pain rated as 8/10 at beginning of session - nurse present for medication  administration.   Therapy/Group: Individual Therapy  Tawana Scale , PT, DPT, NCS, CSRS 07/13/2022, 9:20 AM

## 2022-07-14 DIAGNOSIS — Z89511 Acquired absence of right leg below knee: Secondary | ICD-10-CM | POA: Diagnosis not present

## 2022-07-14 DIAGNOSIS — E876 Hypokalemia: Secondary | ICD-10-CM | POA: Diagnosis not present

## 2022-07-14 DIAGNOSIS — M86271 Subacute osteomyelitis, right ankle and foot: Secondary | ICD-10-CM | POA: Diagnosis not present

## 2022-07-14 DIAGNOSIS — G8918 Other acute postprocedural pain: Secondary | ICD-10-CM | POA: Diagnosis not present

## 2022-07-14 NOTE — Progress Notes (Signed)
PROGRESS NOTE   Subjective/Complaints:  Feels that limb guard is too big. Causes some discomfort. Asked for smaller one. Otherwise no new issues this am  ROS: Patient denies fever, rash, sore throat, blurred vision, dizziness, nausea, vomiting, diarrhea, cough, shortness of breath or chest pain,   headache, or mood change.       Objective:   No results found. No results for input(s): "WBC", "HGB", "HCT", "PLT" in the last 72 hours.  No results for input(s): "NA", "K", "CL", "CO2", "GLUCOSE", "BUN", "CREATININE", "CALCIUM" in the last 72 hours.   Intake/Output Summary (Last 24 hours) at 07/14/2022 4782 Last data filed at 07/14/2022 0737 Gross per 24 hour  Intake 891 ml  Output --  Net 891 ml        Physical Exam: Vital Signs Blood pressure (!) 96/56, pulse 96, temperature 97.9 F (36.6 C), temperature source Oral, resp. rate 18, height '4\' 11"'$  (1.499 m), weight 62.7 kg, SpO2 92 %.      Constitutional: No distress . Vital signs reviewed. HEENT: NCAT, EOMI, oral membranes moist Neck: supple Cardiovascular: RRR without murmur. No JVD    Respiratory/Chest: CTA Bilaterally without wheezes or rales. Normal effort    GI/Abdomen: BS +, non-tender, non-distended Ext: no clubbing, cyanosis,  Psych: pleasant and cooperative  MS: right stump still  tender, edema improving. Limb guard looks a little long Extremities: No clubbing, cyanosis Skin: incision CDI with staples, minimal s/s discharge--black shrinker Neurologic: Cranial nerves II through XII intact, motor strength is 5/5 in bilateral deltoid, bicep, tricep, grip, B hip flexor,left  knee extensors, ankle dorsiflexor and plantar flexor  Musculoskeletal: Full range of motion in all 4 extremities. No joint swelling       Assessment/Plan: 1. Functional deficits which require 3+ hours per day of interdisciplinary therapy in a comprehensive inpatient rehab  setting. Physiatrist is providing close team supervision and 24 hour management of active medical problems listed below. Physiatrist and rehab team continue to assess barriers to discharge/monitor patient progress toward functional and medical goals  Care Tool:  Bathing    Body parts bathed by patient: Right arm, Left arm, Chest, Abdomen, Front perineal area, Right upper leg, Left upper leg, Left lower leg, Face, Buttocks   Body parts bathed by helper: Buttocks Body parts n/a: Right lower leg   Bathing assist Assist Level: Contact Guard/Touching assist     Upper Body Dressing/Undressing Upper body dressing   What is the patient wearing?: Pull over shirt, Bra    Upper body assist Assist Level: Supervision/Verbal cueing    Lower Body Dressing/Undressing Lower body dressing      What is the patient wearing?: Underwear/pull up, Pants     Lower body assist Assist for lower body dressing: Contact Guard/Touching assist     Toileting Toileting    Toileting assist Assist for toileting: Contact Guard/Touching assist Assistive Device Comment: walker, bsc   Transfers Chair/bed transfer  Transfers assist     Chair/bed transfer assist level: Contact Guard/Touching assist     Locomotion Ambulation   Ambulation assist   Ambulation activity did not occur: Safety/medical concerns (2/2 wound vac on R BKA)  Walk 10 feet activity   Assist  Walk 10 feet activity did not occur: Safety/medical concerns (2/2 wound vac on R BKA)        Walk 50 feet activity   Assist Walk 50 feet with 2 turns activity did not occur: Safety/medical concerns (2/2 wound vac on R BKA)         Walk 150 feet activity   Assist Walk 150 feet activity did not occur: Safety/medical concerns (2/2 wound vac on R BKA)         Walk 10 feet on uneven surface  activity   Assist Walk 10 feet on uneven surfaces activity did not occur: Safety/medical concerns (2/2 wound vac on R  BKA)         Wheelchair     Assist Is the patient using a wheelchair?: Yes Type of Wheelchair: Manual    Wheelchair assist level: Minimal Assistance - Patient > 75%      Wheelchair 50 feet with 2 turns activity    Assist            Wheelchair 150 feet activity     Assist          Blood pressure (!) 96/56, pulse 96, temperature 97.9 F (36.6 C), temperature source Oral, resp. rate 18, height '4\' 11"'$  (1.499 m), weight 62.7 kg, SpO2 92 %.  Medical Problem List and Plan: 1. Functional deficits secondary to right BKA             -patient may shower with tubing clamped, vac disconnected. Do not submerge.              -ELOS/Goals: 10-14 days             - Lives alone in handicapped accessible 1 floor apartment    D/c date 11/13  -Continue CIR therapies including PT, OT, and SLP   -can wear limb guard prn. Discussed need to keep knee extended to prevent contracture.  2.  Antithrombotics: -DVT/anticoagulation:  Pharmaceutical: Lovenox             -antiplatelet therapy: none             -LUE duplex ordered on admission for edema   10/31- will order LLE Dopplers to rule out DVT since has abrupt onset/worsening LLE edema  11/1- Dopplers (-)- done today- due to continuing bleeding/hemorrhoids and hx fof frequent plt transfusions, will stop lovenox per pt request 3. Pain Management: Tylenol, Robaxin, Dilaudid 4 mg q 4 hours as needed   10/30- will add gabapentin 300 mg BID- goal to get off pain meds by d/c that are controlled.   10/31- still having pain, but explained that phantom pain doesn't respond to opiates- it responds to nerve pain meds- said has tolerated gabapentin in past  11/1- increased gabapentin to 300 mg QID  11/3- increased gabapentin to 600 mg TID- will look at weaning dilaudid next week  11/4-5 encouraged pt to massage, rub stump. Continue meds as above 4. Mood/Behavior/Sleep: LCSW to evaluate and provide emotional support             -h/o  bipolar>>continue Abilify, Prozac             -h/o anxiety>>continue clonazepam 0.5 mg BID as needed             -antipsychotic agents: n/a   5. Neuropsych/cognition: This patient is capable of making decisions on her own behalf.             -  SLP eval for difficulty with higher cognitive tasks   11/1- has higher level cognitive issues- will need assist for money/med mgmt. [facility] [place] 6. Skin/Wound Care: Routine skin care checks             -  maintain negative pressure dressing dressing through next Tuesday 10/31             - Wound vac per ortho             10/31- will write for [facility] removal  11/1- removed- has two new small sacral spots  11/4- wound looks ok. Can use simply silver infused shrinker per Dr. Sharol Given protocol.  7. 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 Juleen China: At discharge from rehab, DC PICC after 1x dose of oritavancin; then transition to oral linezolid to complete course at 16/10   9: Alcoholic cirrhosis/ascites/elevated LFTs/esophageal varices:             -continue Lasix 40 mg daily             -continue lactulose 20 grams BID (goal 2-3 bowel movements daily)             -continue nadolol 20 mg daily             -continue aldactone 100 mg daily             -continue Folvite, thiamine supplementation             -Gets OP Qmonthly ascites taps; will need to arrange with IR while admitted   10/30- having 4-7 BM's day will need ot check with next paracentesis is needed  11/1- will get paracentesis today- got out 800cc  11/3- looks better  11/5 discussed with pt need for lactulose given liver disease 10: Seizure disorder: continue Keppra   11. History polysubstance abuse            - Hx alcohol, THC - none  current per patient            - Plan to DC picc prior to discharge as above   12. AKI on CKD stage 3. Admission labs. Baseline Cr Appears 0.4-0.7.       Latest Ref Rng & Units 07/11/2022    4:05 AM 07/08/2022    4:19 AM 07/06/2022    2:08 AM  BMP  Glucose 70 - 99 mg/dL 102  129  104   BUN 6 - 20 mg/dL '10  10  13   '$ Creatinine 0.44 - 1.00 mg/dL 0.50  0.51  0.55   Sodium 135 - 145 mmol/L 133  134  132   Potassium 3.5 - 5.1 mmol/L 3.9  3.4  3.9   Chloride 98 - 111 mmol/L 99  100  94   CO2 22 - 32 mmol/L '25  27  29   '$ Calcium 8.9 - 10.3 mg/dL 8.0  8.3  8.3     AKI resolved   13.  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   11/1- up to 300 mg QID  11/3- increase to 600 mg TID  11/4 see pain discussion above in #3 15. Hypokalemia  10/30- will replete 40 mEq x1 and recheck later in week  11/2- K+  3.9 16. LLE edema  10/31- will get Dopplers of LLE since has edema and has missed multiple doses of lovenox- due to refusal- also ordered TEDs for LLE  11/1- no DVT 17. Cough  10/31- ordered Tessalon pearles TID prn for cough- already has robitussin prn.  18. Anal bleeding/hemorrhoids (pt said she 11/1 - started anusol suppository, cream and witch hazel pads.  11/2- no bleeding in last 12+ hours-     .   LOS: 9 days A FACE TO FACE EVALUATION WAS PERFORMED  Meredith Staggers 07/14/2022, 8:22 AM

## 2022-07-14 NOTE — Progress Notes (Signed)
Physical Therapy Session Note  Patient Details  Name: Briana Sweeney MRN: [id number] Date of Birth: 1970-12-02  Today's Date: 07/14/2022 PT Individual Time: 1301-1343 PT Individual Time Calculation (min): 42 min   Short Term Goals: Week 1:  PT Short Term Goal 1 (Week 1): STG = LTG 2/2 LOS  Skilled Therapeutic Interventions/Progress Updates:      Therapy Documentation Precautions:  Precautions Precautions: Fall Precaution Comments: Reviewed verbally Restrictions Weight Bearing Restrictions: Yes RLE Weight Bearing: Non weight bearing  Pt received semi-reclined in bed with reports of esophageal and right residual limb pain. Nursing notified and administered medication. Pt agreeable to PT session with emphasis on gait training. Pt requests need to toilet and CGA with squat pivot to [facility] and requires verbal cues for locking brakes and hand placement. Pt transported by w/c to main gym for time management. Pt ambulated in parallel bars 15 ft x 4 with seated rest breaks in between each bout with hop-to gait pattern. Pt presents with increased reliance on UE's and decreased L LE foot clearance and is able to correct with verbal cueing. Pt transported to room for time management and progressed to ambulation with RW x 20 ft CGA. Pt presents with decreased activity tolerance and will continue to benefit from gait training. Pt left semi-reclined in bed with all needs in reach and bed alarm on.    Therapy/Group: Individual Therapy  Verl Dicker Verl Dicker PT, DPT  07/14/2022, 7:10 AM

## 2022-07-15 DIAGNOSIS — Z89511 Acquired absence of right leg below knee: Secondary | ICD-10-CM | POA: Diagnosis not present

## 2022-07-15 DIAGNOSIS — M869 Osteomyelitis, unspecified: Secondary | ICD-10-CM | POA: Diagnosis not present

## 2022-07-15 LAB — CBC
HCT: 32.5 % — ABNORMAL LOW (ref 36.0–46.0)
Hemoglobin: 10.6 g/dL — ABNORMAL LOW (ref 12.0–15.0)
MCH: 29.5 pg (ref 26.0–34.0)
MCHC: 32.6 g/dL (ref 30.0–36.0)
MCV: 90.5 fL (ref 80.0–100.0)
Platelets: 120 10*3/uL — ABNORMAL LOW (ref 150–400)
RBC: 3.59 MIL/uL — ABNORMAL LOW (ref 3.87–5.11)
RDW: 25.8 % — ABNORMAL HIGH (ref 11.5–15.5)
WBC: 5.8 10*3/uL (ref 4.0–10.5)
nRBC: 0 % (ref 0.0–0.2)

## 2022-07-15 LAB — BASIC METABOLIC PANEL
Anion gap: 7 (ref 5–15)
BUN: 7 mg/dL (ref 6–20)
CO2: 25 mmol/L (ref 22–32)
Calcium: 7.6 mg/dL — ABNORMAL LOW (ref 8.9–10.3)
Chloride: 96 mmol/L — ABNORMAL LOW (ref 98–111)
Creatinine, Ser: 0.59 mg/dL (ref 0.44–1.00)
GFR, Estimated: >60 mL/min (ref >60)
Glucose, Bld: 92 mg/dL (ref 70–99)
Potassium: 4 mmol/L (ref 3.5–5.1)
Sodium: 128 mmol/L — ABNORMAL LOW (ref 135–145)

## 2022-07-15 LAB — CK: Total CK: 15 U/L — ABNORMAL LOW (ref 38–234)

## 2022-07-15 MED ORDER — HYDROMORPHONE HCL 2 MG PO TABS
4.0000 mg | ORAL_TABLET | Freq: Four times a day (QID) | ORAL | Status: DC | PRN
  Administered 2022-07-15 – 2022-07-18 (×8): 4 mg via ORAL
  Filled 2022-07-15 (×8): qty 2

## 2022-07-15 NOTE — Progress Notes (Signed)
Speech Language Pathology Daily Session Note  Patient Details  Name: Briana Sweeney MRN: [id number] Date of Birth: 03-Dec-1970  Today's Date: 07/15/2022 SLP Individual Time: 1117-3567 SLP Individual Time Calculation (min): 29 min  Short Term Goals: Week 2: SLP Short Term Goal 1 (Week 2): STG=LTG due to ELOS 11/13  Skilled Therapeutic Interventions:Skilled ST services focused on cognitive skills. Pt supported fatigue and could only maintain alertness in less than 30 second intervals, drifting in and out of sleep. Pt even expressed hallucinations, SLP questions dreams? Pt required max A verbal cues to recall memory notebook and read from notebook. Pt required total A to read organization task instructions. Pt was left in room with call bell within reach and bed alarm set. SLP recommends to continue skilled services.     Pain Pain Assessment Pain Score: 0-No pain  Therapy/Group: Individual Therapy  Briana Sweeney  [facility] 07/15/2022, 3:03 PM

## 2022-07-15 NOTE — Consult Note (Signed)
Neuropsychological Consultation   Patient:   Briana Sweeney   DOB:   Jul 14, 1971  MR Number:  [id number]  Location:  [facility] [address] C[address] [place] [id number] [place]  [id number] Dept: [place]: [phone]           Date of Service:   07/15/2022  Start Time:   9 AM End Time:   10 AM  Provider/Observer:  Ilean Skill, Psy.D.       Clinical Neuropsychologist       Billing Code/Service: [phone]  Chief Complaint:    Briana Sweeney is a 51 year old female who is referred for neuropsychological consultation due to coping and adjustment issues with a history of diagnosis and treatment for bipolar affective disorder with depression and anxiety symptoms as well as a history of ethanol abuse.  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  Patient has a history of prior substance abuse significant for alcoholic cirrhosis with recurrent ascites, esophageal varices, hepatitis C and seizure disorder.  Patient has been free of alcohol per report since July 2022.  Patient has been maintained on Keppra with no recent seizure activity.  Reason for Service:  Patient was referred for neuropsychological consultation due to coping and adjustment issues following right BKA after significant osteomyelitis.  Below is the HPI for the current admission.  HPI: Briana Sweeney is a 51 year old female who presented to the emergency department complaining of swelling and redness to her right foot on 06/29/2022.  He had sustained an injury to her right foot approximately 1 month prior to presentation.  She was evaluated by primary care for provider and x-rays were negative for fractures.  He reported she then was evaluated by orthopedic surgeon advised her to proceed to the emergency department for suspected cellulitis.  She was started on broad-spectrum antibiotics.  She received fluid resuscitation for AKI.   Imaging confirmed osteomyelitis of the right foot with tenosynovitis and abscesses.  Antibiotics were changed to Meissen.  Orthopedic surgery was consulted and she underwent right below the knee amputation by Dr. Sharol Given on 10/24.  Infectious disease consultation was obtained on 10/22 and recommended antibiotics for 4 weeks with an end date of 07/30/2022.  Due to history of prior substance abuse, infectious disease recommends she continue on daptomycin via PICC while in CIR and plan for a dose of oritavancin at discharge followed by starting oral linezolid for 7 days until 11/21 or will just transition to linezolid at that time. The patient requires inpatient physical medicine and rehabilitation evaluations and treatment secondary to dysfunction due to right below the knee amputation.   Her past medical history is significant for alcoholic cirrhosis with recurrent ascites, esophageal varices, hepatitis C and seizure disorder.  She reports receiving monthly paracenteses.  Her record states her MELD score is 26 Child-Pugh class C.  Reports last alcohol use was in July 2022. Maintenance data blocker and diuretics.  She is maintained on Keppra and states she is had no recent seizure activity.  Current Status:  Patient was very groggy and lethargic throughout our meeting today reading through therapy notes it appears that this is a variable status and potentially related to medications being used for pain management as well as her Keppra on top of treatment for significant infection.  Patient was oriented and aware of her BKA and described some of the precautions that she has been told to take and maintained and  aware of limitations.  Patient described pain that she has been having particularly in the back of her leg around surgical site.  Patient describes some anxiety and apprehension about what will happen over time now that she is lost her lower limb.  We discussed issue of prostatic in the process around obtaining  that prostatic and expectations.  Patient denied any change in her mood status and denied a worsening of her bipolar disorder.  Patient had been followed by Dr. Roland Earl for psychiatric care until he retired and has been followed by [facility] since.  Again, patient denied any exacerbation of her underlying mood disorder/bipolar disorder.  It was very difficult to get an accurate read as to the patient's cognitive status that she was clearly very groggy and lethargic which appears consistent with some of the therapy notes and other times she appears to be much more alert and awake.  Behavioral Observation: Briana Sweeney  presents as a 51 y.o.-year-old Right handed Caucasian Female who appeared her stated age. her dress was Appropriate and she was Well Groomed and her manners were Appropriate to the situation.  her participation was indicative of Drowsy and Redirectable behaviors.  There were physical disabilities noted.  she displayed an appropriate level of cooperation and motivation.     Interactions:    Minimal Drowsy, Inattentive, and Redirectable  Attention:   abnormal and attention span appeared shorter than expected for age  Memory:   abnormal; remote memory intact, recent memory impaired  Visuo-spatial:  not examined  Speech (Volume):  low  Speech:   normal; slurred  Thought Process:  Coherent and Circumstantial  Though Content:  WNL; not suicidal and not homicidal  Orientation:   person, place, and situation  Judgment:   Fair  Planning:   Poor  Affect:    Blunted, Depressed, Flat, and Lethargic  Mood:    Dysphoric  Insight:   Fair  Intelligence:   normal  Substance Use:  There is a documented history of alcohol, cocaine, and heroin abuse confirmed by medical records.  Patient reports that she has been alcohol free since July 2022.  Medical History:   Past Medical History:  Diagnosis Date   Abscess of bursa of left elbow 09/23/2018   Acute hyperactive alcohol  withdrawal delirium ([place]) 10/09/2017   Acute low back pain with bilateral sciatica    Acute metabolic encephalopathy 84/16/6063   Acute on chronic pancreatitis ([place]) 04/04/2021   AKI (acute kidney injury) ([place]) 04/04/2021   Alcohol withdrawal syndrome with complication ([place]) 01/60/1093   Alcohol-induced acute pancreatitis    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[place]) 12/05/2018   Ascites due to alcoholic cirrhosis ([place])    Bipolar affective disorder ([place])    With anxiety features   Bunionette of right foot 11/2019   Cirrhosis of liver ([place])    Due to alcohol and hepatitis C   Cocaine dependence without complication ([place]) 23/55/7322   Decompensated hepatic cirrhosis ([place]) 07/23/2017   Depression with anxiety    Epistaxis 01/20/2019   Erysipelas 09/13/2018   Esophageal 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 ([place])    Esophageal varices without bleeding (HCC)    ETOHism (HCC)    GERD (gastroesophageal reflux disease)    Head injury    Hematemesis 02/10/2018   Hepatitis C 2018   hepatitis c and alcohol related hepatitis   Heroin abuse ([place])    History of blood transfusion    "blood doesn't clot; I fell down and had to have a transfusion"   History of  kidney stones    Menopause 2016   Migraine    "when I get really stressed" (09/01/2017)   Neuropathy 10/27/2018   Olecranon bursitis of left elbow    Other mixed anxiety disorders 10/27/2018   Overdose 08/22/2018   Pancytopenia ([place]) 07/23/2017   Polysubstance abuse ([place]) 04/08/2018   Portal hypertension ([place]) 03/18/2018   S/P foot surgery, right 11/2019   Schizophrenia ([place])    Seizures ([place])    "when I run out of my RX; lots recently" (09/01/2017)   Suicidal ideation    Thrombocytopenia ([place])    Trichimoniasis 10/30/2017   Upper GI bleed 02/10/2018   Urinary urgency 05/2020   UTI (urinary tract infection) 06/02/2017         Patient Active Problem List   Diagnosis Date Noted   Hx of BKA, right ([place]) 07/15/2022   Osteomyelitis of right foot ([place])  07/05/2022   Cutaneous abscess of right foot 07/02/2022   MRSA bacteremia    Foot osteomyelitis, right ([place]) 06/30/2022   Sepsis ([name]) 06/29/2022   Portal vein thrombosis 05/15/2022   History of ITP 05/15/2022   GERD without esophagitis 05/14/2022   C. difficile colitis 88/41/6606   ALC (alcoholic liver cirrhosis) ([place]) 30/16/0109   Alcoholic cirrhosis of liver with ascites ([place]) 02/27/2022   Ascites 01/31/2022   Hyperkalemia 01/30/2022   Hepatic cirrhosis ([place]) 01/23/2022   Liver failure without hepatic coma ([place]) 01/22/2022   Hypophosphatemia 01/22/2022   Severe neutropenia ([place]) 12/16/2021   Cellulitis of right foot 12/16/2021   Severe protein-calorie malnutrition ([place]) 12/15/2021   Anasarca 11/01/2021   Acute posthemorrhagic anemia    Esophageal 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 ([place])    Abdominal pain 10/31/2021   Palpitations 10/11/2021   Bradycardia 07/23/2021   Secondary esophageal varices with bleeding ([place])    Alcohol withdrawal ([place]) 06/26/2021   Acute renal failure superimposed on stage 3a chronic kidney disease ([place]) 04/04/2021   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HCC) 12/18/2020   Chronic anemia 10/02/2020   Hyponatremia 04/02/2019   Anxiety and depression 10/27/2018   Neuropathy 10/27/2018   Substance induced mood disorder ([place]) 07/17/2018   Encephalopathy, portal systemic 04/08/2018   Seizure disorder ([place]) 03/18/2018   Hepatitis C 03/18/2018   Portal hypertension ([place]) 03/18/2018   Depression 03/18/2018   Thrombocytopenia ([place]) 01/02/2018   GERD (gastroesophageal reflux disease) 11/01/2017   Alcohol use disorder, severe, dependence ([place]) 10/29/2017   Esophageal varices without bleeding ([place])    Abdominal ascites    Coagulopathy ([place]) 07/23/2017   Pancytopenia ([place]) 07/23/2017   Psychiatric History:  Patient with past psychiatric history including diagnoses of bipolar affective disorder, depression and anxiety and history of polysubstance abuse.  Family  Med/Psych History:  Family History  Problem Relation Age of Onset   Lung cancer Mother 53   Alcohol abuse Mother    Throat cancer Father 60    Risk of Suicide/Violence: low patient denies any suicidal or homicidal ideation.  Impression/DX:  Briana Sweeney is a 51 year old female who is referred for neuropsychological consultation due to coping and adjustment issues with a history of diagnosis and treatment for bipolar affective disorder with depression and anxiety symptoms as well as a history of ethanol abuse.  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  Patient has a history of prior substance abuse significant for alcoholic cirrhosis with recurrent ascites, esophageal varices, hepatitis C and seizure disorder.  Patient has been free of alcohol per report since July 2022.  Patient has been maintained on Keppra with no recent seizure activity  Disposition/Plan:  Today we worked on coping and adjustment issues after her right BKA and reviewed issues associated with her history of mood disorder.  It was difficult to get a clear assessment of her status due to her lethargy.  Patient denied any exacerbation of her underlying psychiatric issues.           Electronically Signed   _______________________ Ilean Skill, Psy.D. Clinical Neuropsychologist

## 2022-07-15 NOTE — Progress Notes (Signed)
Occupational Therapy Session Note  Patient Details  Name: Idil Maslanka MRN: [id number] Date of Birth: Jan 12, 1971  Today's Date: 07/15/2022 OT Individual Time: 1st Session 1030-1130, 2nd session 1415-1535 OT Individual Time Calculation (min): 60 min, 80 min    Short Term Goals: Week 2:  OT Short Term Goal 1 (Week 2): Pt will perform shower seated with grab br support for breif stanidng with mod I, AE/DME OT Short Term Goal 2 (Week 2): Pt will stand for counter level task in kitchen for up to 2-3 minutes with support and mod I  Skilled Therapeutic Interventions/Progress Updates:  1st Session:  Pt awake resting in bed upon OT arrival. Reports 7/10 pain in R residual limb. More awake and alert this session. Focus on preparation towards discharge home for early next week. W/c safety and recall of parts, steps of transfers and recall of falls prevention reinforced throughout session with min-mod cues at times. Pt requested toileting and current status for session and self care as follows: CGA toilet and tub transfer bench, Indep UB self care w/c level, CGA LB self care seated with brief intermittent push up to stand or lateral leans. Stood for 2.5 min sinkside for hand washing and hair care with CGA. Pt then transported for time management to ADL apt space for simple kitchen safety education. OT reinforced use of RW in front of counter but need for counter support if reaching with + teach back as well as use of reacher. Pt stood 4 trials of 3 minutes with CGA fading to close S. Back in room, pt requested transfer back to bed and required close S and min cues. Discussed with pt OT recommending for home tub transfer bench, 3 in 1, reacher, [place] [facility] if someone available for training. Left pt bed level with call button and needs in reach and bed alarm set.   2nd Session:  Pt in bed at start of session. [employer] arrived and issued pt an XS limb protector as current was ordered incorrectly as  Med from OR. Pt educated in proper donning under clothes and pt able to doff current R LE of sweat pants bed level and min A for correct placement and securing of limb protector. Requested toileting therefore transferred bed to w/c with close S squat pivot and on/off toilet for BM and voiding with set up only for hygiene. See Flowsheets for info. After hand washing sink side, OT transported pt down to 1st floor for time management purposes. Self propulsion 50 ft x 2 outside on unlevel surfaces and around turns R and L with close s and min cues. Seated rest for 5 minutes with cues for breathing and well-being as pt has reported some s/s of depression and anxiety. OT discussed stressor management and coping strategies. Pt then able to self propel back to room ~ 125 ft with 2 brief intervals of rest with OT assist on and off elevators. Left pt with all needs, call button and chair exit alarm set.    Therapy Documentation Precautions:  Precautions Precautions: Fall Precaution Comments: Reviewed verbally Restrictions Weight Bearing Restrictions: Yes RLE Weight Bearing: Non weight bearing   Therapy/Group: Individual Therapy  Barnabas Lister 07/15/2022, 7:48 AM

## 2022-07-15 NOTE — Progress Notes (Signed)
Physical Therapy Session Note  Patient Details  Name: Katie Faraone MRN: [id number] Date of Birth: February 25, 1971  Today's Date: 07/15/2022 PT Individual Time: 3893-7342 PT Individual Time Calculation (min): 43 min   Short Term Goals: Week 1:  PT Short Term Goal 1 (Week 1): STG = LTG 2/2 LOS  Skilled Therapeutic Interventions/Progress Updates:      Therapy Documentation Precautions:  Precautions Precautions: Fall Precaution Comments: Reviewed verbally Restrictions Weight Bearing Restrictions: Yes RLE Weight Bearing: Non weight bearing   Pt received seated edge of bed with nursing present. Pt reports 8/10 residual limb pain and pre-medicated. Pt with memory deficits throughout session and requires frequent cueing for safety and AD management. Pt with limb guard donned incorrectly and therapist provided instructions on how to don appropriately. Pt attempted transfer to w/c prior to locking of brakes and requires cues for safety for w/c set-up and management. Pt SBA with squat pivot from bed to w/c and propelled w/c ~150 ft with supervision for safety to main gym. Pt ambulated x 5 ft with RW and limb guard became loose and fell off, PT observed excess drainage and pt transported to room. Nursing applied dressing to limb guard and pt requires repeat verbal cues on how to appropriately don limb guard despite being educated at start of session. Pt ambulated 20 ft with RW CGA in room and left semi-reclined in bed with all needs in reach and bed alarm on.    Therapy/Group: Individual Therapy  Verl Dicker Verl Dicker PT, DPT  07/15/2022, 7:46 AM

## 2022-07-15 NOTE — Progress Notes (Signed)
Physical Therapy Weekly Progress Note  Patient Details  Name: Briana Sweeney MRN: [id number] Date of Birth: 01/09/1971  Beginning of progress report period: July 06, 2022 End of progress report period: July 15, 2022  Patient is making steady progress to long term goals due to improvements in balance, coordination, strength and activity tolerance. Pt limited due to decreased safety awareness and impaired memory and requires frequent verbal, visual and tactile cueing for safe mobility with transfers, gait, w/c mobility and w/c set-up. Pt supervision for bed mobility, SBA for bed to chair transfers, supervision for w/c navigation x 250 ft and CGA for gait x 20 ft with RW.   Patient continues to demonstrate the following deficits muscle weakness, decreased cardiorespiratoy endurance, decreased awareness, decreased problem solving, decreased safety awareness, decreased memory, and delayed processing, and decreased standing balance and decreased balance strategies and therefore will continue to benefit from skilled PT intervention to increase functional independence with mobility.  Patient progressing toward long term goals..  Continue plan of care.  PT Short Term Goals Week 1:  PT Short Term Goal 1 (Week 1): STG = LTG 2/2 LOS PT Short Term Goal 1 - Progress (Week 1): Progressing toward goal Week 2:  PT Short Term Goal 1 (Week 2): STG=LTG due to extended length of stay  Skilled Therapeutic Interventions/Progress Updates:      Therapy Documentation Precautions:  Precautions Precautions: Fall Precaution Comments: Reviewed verbally Restrictions Weight Bearing Restrictions: Yes RLE Weight Bearing: Non weight bearing    Therapy/Group: Individual Therapy  Verl Dicker 07/15/2022, 12:02 PM

## 2022-07-15 NOTE — Progress Notes (Signed)
Orthopedic Tech Progress Note Patient Details:  Briana Sweeney 03/01/1971 [id number]  Called in order to [employer] for a LIMB PROTECTOR    Patient ID: Briana Sweeney, female   DOB: [birth year], 51 y.o.   MRN: [id number]  Briana Sweeney 07/15/2022, 12:33 PM

## 2022-07-15 NOTE — Progress Notes (Signed)
Nutrition Follow-up  DOCUMENTATION CODES:   Not applicable  INTERVENTION:   Continue Multivitamin w/ minerals, Vitamin C, Folic acid, and Thiamine daily Continue 1 packet Juven BID, each packet provides 95 calories, 2.5 grams of protein (collagen) to support wound healing Continue Ensure Enlive po BID, each supplement provides 350 kcal and 20 grams of protein. Encourage good PO intake   NUTRITION DIAGNOSIS:   Increased nutrient needs related to wound healing, post-op healing as evidenced by estimated needs.- Ongoing   GOAL:   Patient will meet greater than or equal to 90% of their needs - Progressing   MONITOR:   PO intake, Supplement acceptance, Labs, Skin  REASON FOR ASSESSMENT:   Malnutrition Screening Tool    ASSESSMENT:   51 yo female admitted to rehab with debility d/t R foot osteomyelitis and abscess S/P R BKA 07/02/22. PMH includes alcohol cirrhosis, recurrent ascites (receives  monthly paracenteses), esophageal varices, hepatitis C, seizure disorder, bipolar disorder, GERD, schizophrenia, seizures, heroin abuse, alcohol abuse.  Pt very lethargic during RD visit, mumbled a few words. Was able to report some improvement in her appetite. States that she has not been getting Ensure or Juven from nursing. Per Meds History, pt has been accepting Ensure and Juven majority of the time. RD explained the importance of good nutrition to assist in wound healing and energy to work with therapy.   Meal Intake 11/3-11/5: 0-100% x 7 meals (average 47%)  Medications reviewed and include: Vitamin C, Banatrol, Folic Acid, Lasix, Lactulose, MVI, Protonix, Spironolactone, Thiamine, IV antibiotics  Labs reviewed: Sodium 128  Diet Order:   Diet Order             Diet regular Room service appropriate? Yes; Fluid consistency: Thin  Diet effective now                   EDUCATION NEEDS:   Not appropriate for education at this time  Skin:  Skin Assessment: Skin Integrity  Issues: Skin Integrity Issues:: Other (Comment), Wound VAC Wound Vac: R leg BKA Other: fissure to coccyx  Last BM:  11/5 - Type 6  Height:  Ht Readings from Last 1 Encounters:  07/05/22 '4\' 11"'$  (1.499 m)   Weight:  Wt Readings from Last 1 Encounters:  07/15/22 65.4 kg   BMI:  Body mass index is 29.12 kg/m. Adjusted BMI: 29.4 kg/m2  Estimated Nutritional Needs:  Kcal:  1800-2000 Protein:  100-120 gm Fluid:  1.8-2 L   Hermina Barters RD, LDN Clinical Dietitian See [facility] for contact information.

## 2022-07-15 NOTE — Progress Notes (Signed)
PROGRESS NOTE   Subjective/Complaints:  Limb guard too big. Keeps coming off.   Slept MUCH better- thinks her gabapentin was increased and thinks that helped.   Plts down to 120k and Na 128- down from 133.     ROS:  Pt denies SOB, abd pain, CP, N/V/C/D, and vision changes      Objective:   No results found. Recent Labs    07/15/22 0339  WBC 5.8  HGB 10.6*  HCT 32.5*  PLT 120*    Recent Labs    07/15/22 0339  NA 128*  K 4.0  CL 96*  CO2 25  GLUCOSE 92  BUN 7  CREATININE 0.59  CALCIUM 7.6*     Intake/Output Summary (Last 24 hours) at 07/15/2022 0908 Last data filed at 07/15/2022 0527 Gross per 24 hour  Intake 238 ml  Output --  Net 238 ml        Physical Exam: Vital Signs Blood pressure 105/71, pulse (!) 102, temperature 98.7 F (37.1 C), temperature source Oral, resp. rate 16, height '4\' 11"'$  (1.499 m), weight 65.4 kg, SpO2 91 %.       General: awake, alert, appropriate, sitting up in bed; wearing limb guard; NAD HENT: conjugate gaze; oropharynx moist CV: regular/borderline tachycardic rate; no JVD Pulmonary: CTA B/L; no W/R/R- good air movement GI: soft, NT, ND, (+)BS- protuberant from fluid, but much less so than before paracentesis Psychiatric: appropriate Neurological: Ox3   MS: right stump still  tender, edema improving. Limb guard looks a little long- too big- falling off some even when tightened Extremities: No clubbing, cyanosis Skin: incision CDI with staples, minimal s/s discharge--black shrinker Neurologic: Cranial nerves II through XII intact, motor strength is 5/5 in bilateral deltoid, bicep, tricep, grip, B hip flexor,left  knee extensors, ankle dorsiflexor and plantar flexor  Musculoskeletal: Full range of motion in all 4 extremities. No joint swelling       Assessment/Plan: 1. Functional deficits which require 3+ hours per day of interdisciplinary therapy in a  comprehensive inpatient rehab setting. Physiatrist is providing close team supervision and 24 hour management of active medical problems listed below. Physiatrist and rehab team continue to assess barriers to discharge/monitor patient progress toward functional and medical goals  Care Tool:  Bathing    Body parts bathed by patient: Right arm, Left arm, Chest, Abdomen, Front perineal area, Right upper leg, Left upper leg, Left lower leg, Face, Buttocks   Body parts bathed by helper: Buttocks Body parts n/a: Right lower leg   Bathing assist Assist Level: Contact Guard/Touching assist     Upper Body Dressing/Undressing Upper body dressing   What is the patient wearing?: Pull over shirt, Bra    Upper body assist Assist Level: Supervision/Verbal cueing    Lower Body Dressing/Undressing Lower body dressing      What is the patient wearing?: Underwear/pull up, Pants     Lower body assist Assist for lower body dressing: Contact Guard/Touching assist     Toileting Toileting    Toileting assist Assist for toileting: Contact Guard/Touching assist Assistive Device Comment: walker, bsc   Transfers Chair/bed transfer  Transfers assist     Chair/bed transfer assist  level: Contact Guard/Touching assist     Locomotion Ambulation   Ambulation assist   Ambulation activity did not occur: Safety/medical concerns (2/2 wound vac on R BKA)          Walk 10 feet activity   Assist  Walk 10 feet activity did not occur: Safety/medical concerns (2/2 wound vac on R BKA)        Walk 50 feet activity   Assist Walk 50 feet with 2 turns activity did not occur: Safety/medical concerns (2/2 wound vac on R BKA)         Walk 150 feet activity   Assist Walk 150 feet activity did not occur: Safety/medical concerns (2/2 wound vac on R BKA)         Walk 10 feet on uneven surface  activity   Assist Walk 10 feet on uneven surfaces activity did not occur: Safety/medical  concerns (2/2 wound vac on R BKA)         Wheelchair     Assist Is the patient using a wheelchair?: Yes Type of Wheelchair: Manual    Wheelchair assist level: Minimal Assistance - Patient > 75%      Wheelchair 50 feet with 2 turns activity    Assist            Wheelchair 150 feet activity     Assist          Blood pressure 105/71, pulse (!) 102, temperature 98.7 F (37.1 C), temperature source Oral, resp. rate 16, height '4\' 11"'$  (1.499 m), weight 65.4 kg, SpO2 91 %.  Medical Problem List and Plan: 1. Functional deficits secondary to right BKA             -patient may shower with tubing clamped, vac disconnected. Do not submerge.              -ELOS/Goals: 10-14 days             - Lives alone in handicapped accessible 1 floor apartment    D/c date 11/13  Con't CIR- PT, OT and SLP 2.  Antithrombotics: -DVT/anticoagulation:  Pharmaceutical: Lovenox             -antiplatelet therapy: none             -LUE duplex ordered on admission for edema   10/31- will order LLE Dopplers to rule out DVT since has abrupt onset/worsening LLE edema  11/1- Dopplers (-)- done today- due to continuing bleeding/hemorrhoids and hx fof frequent plt transfusions, will stop lovenox per pt request 3. Pain Management: Tylenol, Robaxin, Dilaudid 4 mg q 4 hours as needed   10/30- will add gabapentin 300 mg BID- goal to get off pain meds by d/c that are controlled.   10/31- still having pain, but explained that phantom pain doesn't respond to opiates- it responds to nerve pain meds- said has tolerated gabapentin in past  11/1- increased gabapentin to 300 mg QID  11/3- increased gabapentin to 600 mg TID- will look at weaning dilaudid next week  11/4-5 encouraged pt to massage, rub stump. Continue meds as above  11/6- will reduce Dilaudid to q6 hours prn 4. Mood/Behavior/Sleep: LCSW to evaluate and provide emotional support             -h/o bipolar>>continue Abilify, Prozac              -h/o anxiety>>continue clonazepam 0.5 mg BID as needed             -antipsychotic  agents: n/a   5. Neuropsych/cognition: This patient is capable of making decisions on her own behalf.             -SLP eval for difficulty with higher cognitive tasks   11/1- has higher level cognitive issues- will need assist for money/med mgmt. [facility] [place] 6. Skin/Wound Care: Routine skin care checks             -  maintain negative pressure dressing dressing through next Tuesday 10/31             - Wound vac per ortho             10/31- will write for [facility] removal  11/1- removed- has two new small sacral spots  11/4- wound looks ok. Can use simply silver infused shrinker per Dr. Sharol Given protocol.  7. 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 Juleen China: At discharge from rehab, DC PICC after 1x dose of oritavancin; then transition to oral linezolid to complete course at 18/84   9: Alcoholic cirrhosis/ascites/elevated LFTs/esophageal varices:             -continue Lasix 40 mg daily             -continue lactulose 20 grams BID (goal 2-3 bowel movements daily)             -continue nadolol 20 mg daily             -continue aldactone 100 mg daily             -continue Folvite, thiamine supplementation             -Gets OP Qmonthly ascites taps; will need to arrange with IR while admitted   10/30- having 4-7 BM's day will need ot check with next paracentesis is needed  11/1- will get paracentesis today- got out 800cc  11/3- looks better  11/5 discussed with pt need for lactulose given liver disease 10: Seizure disorder: continue Keppra   11. History polysubstance abuse            - Hx alcohol, THC - none current per patient            - Plan to DC picc  prior to discharge as above   12. AKI on CKD stage 3. Admission labs. Baseline Cr Appears 0.4-0.7.       Latest Ref Rng & Units 07/15/2022    3:39 AM 07/11/2022    4:05 AM 07/08/2022    4:19 AM  BMP  Glucose 70 - 99 mg/dL 92  102  129   BUN 6 - 20 mg/dL '7  10  10   '$ Creatinine 0.44 - 1.00 mg/dL 0.59  0.50  0.51   Sodium 135 - 145 mmol/L 128  133  134   Potassium 3.5 - 5.1 mmol/L 4.0  3.9  3.4   Chloride 98 - 111 mmol/L 96  99  100   CO2 22 - 32 mmol/L '25  25  27   '$ Calcium 8.9 - 10.3 mg/dL 7.6  8.0  8.3  AKI resolved   13.  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   11/1- up to 300 mg QID  11/3- increase to 600 mg TID  11/4 see pain discussion above in #3 15. Hypokalemia  10/30- will replete 40 mEq x1 and recheck later in week  11/2- K+  3.9 16. LLE edema  10/31- will get Dopplers of LLE since has edema and has missed multiple doses of lovenox- due to refusal- also ordered TEDs for LLE  11/1- no DVT 17. Cough  10/31- ordered Tessalon pearles TID prn for cough- already has robitussin prn.  18. Anal bleeding/hemorrhoids (pt said she 11/1 - started anusol suppository, cream and witch hazel pads.  11/2- no bleeding in last 12+ hours-  18. Hyponatremia  11/6- Na down to 128- is unusual for her- will recheck Wednesday and monitor   .   LOS: 10 days A FACE TO FACE EVALUATION WAS PERFORMED  Briana Sweeney 07/15/2022, 9:08 AM

## 2022-07-16 DIAGNOSIS — M869 Osteomyelitis, unspecified: Secondary | ICD-10-CM | POA: Diagnosis not present

## 2022-07-16 NOTE — Patient Care Conference (Signed)
Inpatient RehabilitationTeam Conference and Plan of Care Update Date: 07/16/2022   Time: 11:33 AM    Patient Name: Briana Sweeney      Medical Record Number: [id number]  Date of Birth: 1971/07/24 Sex: Female         Room/Bed: 4M05C/4M05C-01 Payor Info: Payor: [employer] / Plan: Roxana Hires / Product Type: *No Product type* /    Admit Date/Time:  07/05/2022  1:51 PM  Primary Diagnosis:  Osteomyelitis of right foot [facility])  Hospital Problems: Principal Problem:   Osteomyelitis of right foot ([place]) Active Problems:   Hx of BKA, right [facility])    Expected Discharge Date: Expected Discharge Date: 07/22/22  Team Members Present: Physician leading conference: Dr. Courtney Heys Social Worker Present: Ovidio Kin, LCSW Nurse Present: Tacy Learn, RN PT Present: Verl Dicker, PT OT Present: Jamey Ripa, OT SLP Present: Helaine Chess, SLP PPS Coordinator present : Gunnar Fusi, SLP     Current Status/Progress Goal Weekly Team Focus  Bowel/Bladder   Continent of b/b. LBM: 11/6   Remain continent of b/b   Offer/Assist w/ toileting needs q shift & PRN    Swallow/Nutrition/ Hydration               ADL's   CGA toilet and tub transfer bench, Indep UB self care w/c level, CGA LB self care seated with brief intermittent push up to stand or lateral leans   SBA-Mod I   Standing tolerance, safety with IADFL's and progress to mod I for self care    Mobility   (S) bed mobility, SBA squat & stand pivot, w/c 250 ft (S), gait with RW x 20 ft CGA   Mod I  phantom limb pain management, pt/fam education, transfers, w/c mobility, activity tolerance    Communication                Safety/Cognition/ Behavioral Observations  Max A with fatigue impacting overall function   Min-Mod A   recall with use of strategies, problem solving, sustained attention    Pain   Pt c/o of R leg pain where BKA was done (phantom pain). Pain is rated as a 8/10, constant pain. PRN dilaudid given for  relief.   To keep pain <5/10   Assess pain q shift & PRN    Skin   Shrinker applied on R BKA; Staples to surgery site.   Assess & monitor incision site to prevent any skin issues or breakdowns.  Assess skin q shift & PRN      Discharge Planning:  Home alone with intermittent assist from neighbor and pastor and his wife. Making progress in therapies and have found home health to accept referral   Team Discussion: Right BKA. Continent B/B. Patient refuses lactulose. Educate patient for need. Pain always 8/10 with dilaudid around the clock. Will wean. Incision has some drainage. Staples intact with shrinker in place as well as limb guard. PICC with ABT therapy and will be removed at discharge with ID follow up. Patient requires one step commands and is forgetful even with cueing. Arraignments made for this Thursday for amputee group.  Patient on target to meet rehab goals: no, goals downgraded  *See Care Plan and progress notes for long and short-term goals.   Revisions to Treatment Plan:  Medication adjustments, monitor labs, attend amputee group  Teaching Needs: Medications, safety, skin/wound care, gait/transfer training, etc  Current Barriers to Discharge: Lack of/limited family support, Weight bearing restrictions, Infection/IV Antibiotics, Uncontrolled Pain , Behavior/Mood, and Self-care  education   Possible Resolutions to Barriers: Family education, nursing education, 24/7 care, order recommended DME     Medical Summary Current Status: pain always 8/10- taking dialudid as much as possible- am weaning it-  Barriers to Discharge: Behavior;Decreased family/caregiver support;Home enviroment access/layout;Medical stability;IV antibiotics;Medication compliance;Wound care;Weight bearing restrictions;Weight;Other (comments)  Barriers to Discharge Comments: no help at home except minister- also will get her H/H- will need to write out sequences of how to transfer, put on limb  guard, Possible Resolutions to [employer] Focus: poor safety awareness- cannot put on limb guard and set brakes- CGA for LB- practicing standing tolerance- needs amputee pad- doing support group for BKAs Thursday- cognitive issues still max Assist- fatigue impacts as well- will downgrade goals for SLP since not making progress-keeps refusing lactulose- cannot remember that does- d/c 11/13   Continued Need for Acute Rehabilitation Level of Care: The patient requires daily medical management by a physician with specialized training in physical medicine and rehabilitation for the following reasons: Direction of a multidisciplinary physical rehabilitation program to maximize functional independence : Yes Medical management of patient stability for increased activity during participation in an intensive rehabilitation regime.: Yes Analysis of laboratory values and/or radiology reports with any subsequent need for medication adjustment and/or medical intervention. : Yes   I attest that I was present, lead the team conference, and concur with the assessment and plan of the team.   Ernest Pine 07/16/2022, 5:16 PM

## 2022-07-16 NOTE — Progress Notes (Signed)
Physical Therapy Session Note  Patient Details  Name: Ridhima Golberg MRN: [id number] Date of Birth: March 27, 1971  Today's Date: 07/16/2022 PT Individual Time: 1417-1527 PT Individual Time Calculation (min): 70 min   Short Term Goals: Week 2:  PT Short Term Goal 1 (Week 2): STG=LTG due to extended length of stay  Skilled Therapeutic Interventions/Progress Updates:      Therapy Documentation Precautions:  Precautions Precautions: Fall Precaution Comments: Reviewed verbally Restrictions Weight Bearing Restrictions: Yes RLE Weight Bearing: Non weight bearing  Pt received seated in w/c at bedside with friends present Systems developer and his spouse). Pt's friends participated in training and demonstrated competency with providing verbal cues and supervision with mobility during session. Educated patient and caregivers regarding shrinker, limb guard management, and optimal positioning to reduce edema and shape residual limb to prepare for prosthesis. Pt engaged in blocked practice of squat pivot and stand pivot transfers with patient's caregivers providing verbal and visual cues and supervision. Pt requires frequent cueing due to memory deficits. Pt requested to toilet and was able to void and required supervision for squat pivot transfer from w/c<>BSC. Pt propelled w/c ~150 ft to ortho gym with supervision and verbal cues for navigation through doorways. Pt (S) for safety with car transfers and performed to simulated height with PT and pt's caregiver. Pt ambulated x 30 ft with CGA. Pt propelled w/c to room and left semi-reclined in bed with all needs in reach and bed alarm on.   Therapy/Group: Individual Therapy  Verl Dicker Verl Dicker PT, DPT  07/16/2022, 7:57 AM

## 2022-07-16 NOTE — Progress Notes (Signed)
Pt refused her hydrocortisone rectal cream, suppository and lactulose. Educated pt the importance of each medication, especially lactulose. Pt verbalized understanding and stated that she wont get any sleep tonight if she take those medications, so she would take it in the morning.

## 2022-07-16 NOTE — Progress Notes (Signed)
PROGRESS NOTE   Subjective/Complaints:  Got new limb guard- fits much better- but felt too tight last night- became slightly painful.  Also developed some drainage last night- R BKA dressed over silver shrinker by Dr Sharol Given.     ROS:   Pt denies SOB, abd pain, CP, N/V/C/D, and vision changes Except for HPI     Objective:   No results found. Recent Labs    07/15/22 0339  WBC 5.8  HGB 10.6*  HCT 32.5*  PLT 120*    Recent Labs    07/15/22 0339  NA 128*  K 4.0  CL 96*  CO2 25  GLUCOSE 92  BUN 7  CREATININE 0.59  CALCIUM 7.6*     Intake/Output Summary (Last 24 hours) at 07/16/2022 0759 Last data filed at 07/16/2022 3419 Gross per 24 hour  Intake 657 ml  Output --  Net 657 ml        Physical Exam: Vital Signs Blood pressure (!) 122/91, pulse 85, temperature 99.5 F (37.5 C), temperature source Oral, resp. rate 20, height '4\' 11"'$  (1.499 m), weight 63.7 kg, SpO2 98 %.        General: awake, alert, appropriate, sitting up in bed; tangled in sheets; NAD HENT: conjugate gaze; oropharynx moist CV: regular rate; no JVD Pulmonary: CTA B/L; no W/R/R- good air movement GI: soft, NT, ND, (+)BS Psychiatric: appropriate- flat affect Neurological: alert, but a little more sleepy;  MS: R BKA has some drainage lateral aspect and a skin tear bleeding on posterior aspect- pinpoint-  Extremities: No clubbing, cyanosis Skin: incision CDI with staples, minimal s/s discharge--black shrinker Neurologic: Cranial nerves II through XII intact, motor strength is 5/5 in bilateral deltoid, bicep, tricep, grip, B hip flexor,left  knee extensors, ankle dorsiflexor and plantar flexor  Musculoskeletal: Full range of motion in all 4 extremities. No joint swelling       Assessment/Plan: 1. Functional deficits which require 3+ hours per day of interdisciplinary therapy in a comprehensive inpatient rehab setting. Physiatrist  is providing close team supervision and 24 hour management of active medical problems listed below. Physiatrist and rehab team continue to assess barriers to discharge/monitor patient progress toward functional and medical goals  Care Tool:  Bathing    Body parts bathed by patient: Right arm, Left arm, Chest, Abdomen, Front perineal area, Right upper leg, Left upper leg, Left lower leg, Face, Buttocks   Body parts bathed by helper: Buttocks Body parts n/a: Right lower leg   Bathing assist Assist Level: Contact Guard/Touching assist     Upper Body Dressing/Undressing Upper body dressing   What is the patient wearing?: Pull over shirt, Bra    Upper body assist Assist Level: Independent    Lower Body Dressing/Undressing Lower body dressing      What is the patient wearing?: Underwear/pull up, Pants     Lower body assist Assist for lower body dressing: Contact Guard/Touching assist     Toileting Toileting    Toileting assist Assist for toileting: Set up assist Assistive Device Comment: walker, bsc   Transfers Chair/bed transfer  Transfers assist     Chair/bed transfer assist level: Contact Guard/Touching assist  Locomotion Ambulation   Ambulation assist   Ambulation activity did not occur: Safety/medical concerns (2/2 wound vac on R BKA)          Walk 10 feet activity   Assist  Walk 10 feet activity did not occur: Safety/medical concerns (2/2 wound vac on R BKA)        Walk 50 feet activity   Assist Walk 50 feet with 2 turns activity did not occur: Safety/medical concerns (2/2 wound vac on R BKA)         Walk 150 feet activity   Assist Walk 150 feet activity did not occur: Safety/medical concerns (2/2 wound vac on R BKA)         Walk 10 feet on uneven surface  activity   Assist Walk 10 feet on uneven surfaces activity did not occur: Safety/medical concerns (2/2 wound vac on R BKA)         Wheelchair     Assist Is the  patient using a wheelchair?: Yes Type of Wheelchair: Manual    Wheelchair assist level: Minimal Assistance - Patient > 75% Max wheelchair distance: 175 feet (per PT report)    Wheelchair 50 feet with 2 turns activity    Assist        Assist Level: Minimal Assistance - Patient > 75% (per PT report, pt completed 175 feet Min A)   Wheelchair 150 feet activity     Assist      Assist Level: Minimal Assistance - Patient > 75% (Per PT report, pt completed 175 feet Min A)   Blood pressure (!) 122/91, pulse 85, temperature 99.5 F (37.5 C), temperature source Oral, resp. rate 20, height '4\' 11"'$  (1.499 m), weight 63.7 kg, SpO2 98 %.  Medical Problem List and Plan: 1. Functional deficits secondary to right BKA             -patient may shower with tubing clamped, vac disconnected. Do not submerge.              -ELOS/Goals: 10-14 days             - Lives alone in handicapped accessible 1 floor apartment    D/c date 11/13  Con't CIR- PT, OT and SLP- team conference to f/u on progress 2.  Antithrombotics: -DVT/anticoagulation:  Pharmaceutical: Lovenox             -antiplatelet therapy: none             -LUE duplex ordered on admission for edema   10/31- will order LLE Dopplers to rule out DVT since has abrupt onset/worsening LLE edema  11/1- Dopplers (-)- done today- due to continuing bleeding/hemorrhoids and hx fof frequent plt transfusions, will stop lovenox per pt request 3. Pain Management: Tylenol, Robaxin, Dilaudid 4 mg q 4 hours as needed   10/30- will add gabapentin 300 mg BID- goal to get off pain meds by d/c that are controlled.   10/31- still having pain, but explained that phantom pain doesn't respond to opiates- it responds to nerve pain meds- said has tolerated gabapentin in past  11/1- increased gabapentin to 300 mg QID  11/3- increased gabapentin to 600 mg TID- will look at weaning dilaudid next week  11/4-5 encouraged pt to massage, rub stump. Continue meds as  above  11/6- will reduce Dilaudid to q6 hours prn  11/7- educated pt will con't to wean Dilaudid and will not go home on it.  4. Mood/Behavior/Sleep: LCSW to evaluate and provide  emotional support             -h/o bipolar>>continue Abilify, Prozac             -h/o anxiety>>continue clonazepam 0.5 mg BID as needed             -antipsychotic agents: n/a   5. Neuropsych/cognition: This patient is capable of making decisions on her own behalf.             -SLP eval for difficulty with higher cognitive tasks   11/1- has higher level cognitive issues- will need assist for money/med mgmt. [facility] [place] 6. Skin/Wound Care: Routine skin care checks             -  maintain negative pressure dressing dressing through next Tuesday 10/31             - Wound vac per ortho             10/31- will write for [facility] removal  11/1- removed- has two new small sacral spots  11/4- wound looks ok. Can use simply silver infused shrinker per Dr. Sharol Given protocol.   11/7- a little more red? Not sure if due to pressure of limb guard? Will recheck CBC in AM- already on IV ABX.  7. 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 Juleen China: At discharge from rehab, DC PICC after 1x dose of oritavancin; then transition to oral linezolid to complete course at 16/10   9: Alcoholic cirrhosis/ascites/elevated LFTs/esophageal varices:             -continue Lasix 40 mg daily             -continue lactulose 20 grams BID (goal 2-3 bowel movements daily)             -continue nadolol 20 mg daily             -continue aldactone 100 mg daily             -continue Folvite, thiamine supplementation             -Gets OP Qmonthly ascites taps; will need to arrange with IR while admitted    10/30- having 4-7 BM's day will need ot check with next paracentesis is needed  11/1- will get paracentesis today- got out 800cc  11/3- looks better  11/5 discussed with pt need for lactulose given liver disease  11/7- intermittently to more frequent refusal of lactulose- educated cannot refuse lactulose. Also refused hemorrhoid meds.  10: Seizure disorder: continue Keppra   11. History polysubstance abuse            - Hx alcohol, THC - none current per patient            - Plan to DC picc prior to discharge as above   12. AKI on CKD stage 3. Admission labs. Baseline Cr Appears 0.4-0.7.       Latest Ref Rng & Units 07/15/2022    3:39 AM 07/11/2022    4:05 AM 07/08/2022    4:19 AM  BMP  Glucose 70 - 99  mg/dL 92  102  129   BUN 6 - 20 mg/dL '7  10  10   '$ Creatinine 0.44 - 1.00 mg/dL 0.59  0.50  0.51   Sodium 135 - 145 mmol/L 128  133  134   Potassium 3.5 - 5.1 mmol/L 4.0  3.9  3.4   Chloride 98 - 111 mmol/L 96  99  100   CO2 22 - 32 mmol/L '25  25  27   '$ Calcium 8.9 - 10.3 mg/dL 7.6  8.0  8.3     AKI resolved   13.  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   11/1- up to 300 mg QID  11/3- increase to 600 mg TID  11/4 see pain discussion above in #3 15. Hypokalemia  10/30- will replete 40 mEq x1 and recheck later in week  11/2- K+  3.9 16. LLE edema  10/31- will get Dopplers of LLE since has edema and has missed multiple doses of lovenox- due to  refusal- also ordered TEDs for LLE  11/1- no DVT 17. Cough  10/31- ordered Tessalon pearles TID prn for cough- already has robitussin prn.  18. Anal bleeding/hemorrhoids (pt said she 11/1 - started anusol suppository, cream and witch hazel pads.  11/2- no bleeding in last 12+ hours-  18. Hyponatremia  11/6- Na down to 128- is unusual for her- will recheck Wednesday and monitor  11/7- labs in AM   I spent a total of 36   minutes on total care today- >50% coordination of care- due to speaking with nursing and changing dressing as well as team conference to f/u on progress.    .   LOS: 11 days A FACE TO FACE EVALUATION WAS PERFORMED  Briana Sweeney 07/16/2022, 7:59 AM

## 2022-07-16 NOTE — Progress Notes (Signed)
Speech Language Pathology Daily Session Note  Patient Details  Name: Briana Sweeney MRN: [id number] Date of Birth: 1970-11-16  Today's Date: 07/16/2022 SLP Individual Time: 0825-0900 SLP Individual Time Calculation (min): 35 min  Short Term Goals: Week 2: SLP Short Term Goal 1 (Week 2): STG=LTG due to ELOS 11/13  Skilled Therapeutic Interventions: Skilled treatment session focused on cognitive goals. Upon arrival, patient appeared awake and alert while sitting on the EOB. Patient required total A for recall regarding having a memory notebook to maximize recall and carryover of functional information. Max A verbal cues were also needed for orientation to the date and to record the date in the memory notebook correctly with decreased error awareness. SLP also facilitated session with a functional cognitive task that focused on  identifying errors in a BID pill box as patient managed her own medications at home and reports she will not have assistance at home and will have to manage his medications at discharge. Patient required Max-Total A to complete task with decreased awareness regarding difficulty of task. Patient left upright in bed with alarm on and all needs within reach. Continue with current plan of care.      Pain Pain Assessment Pain Scale: 0-10 Pain Score: 9  Faces Pain Scale: No hurt Pain Type: Surgical pain Pain Location: Leg Pain Orientation: Right  Therapy/Group: Individual Therapy  Briana Sweeney 07/16/2022, 11:08 AM

## 2022-07-16 NOTE — Plan of Care (Signed)
  Problem: RH Problem Solving Goal: LTG Patient will demonstrate problem solving for (SLP) Description: LTG:  Patient will demonstrate problem solving for basic/complex daily situations with cues  (SLP) Flowsheets Taken 07/16/2022 1138 by Helaine Chess A, CCC-SLP LTG Patient will demonstrate problem solving for: Maximal Assistance - Patient 25 - 49% Taken 07/06/2022 1250 by Charolett Bumpers, [place] LTG: Patient will demonstrate problem solving for (SLP): (mildly complex) Other (comment)   Problem: RH Memory Goal: LTG Patient will use memory compensatory aids to (SLP) Description: LTG:  Patient will use memory compensatory aids to recall biographical/new, daily complex information with cues (SLP) Flowsheets (Taken 07/16/2022 1138) LTG: Patient will use memory compensatory aids to (SLP): Maximal Assistance - Patient 25 - 49%   Problem: RH Attention Goal: LTG Patient will demonstrate this level of attention during functional activites (SLP) Description: LTG:  Patient will will demonstrate this level of attention during functional activites (SLP) Flowsheets Taken 07/16/2022 1138 by Helaine Chess A, CCC-SLP LTG: Patient will demonstrate this level of attention during cognitive/linguistic activities with assistance of (SLP): Moderate Assistance - Patient 50 - 74% Taken 07/06/2022 1250 by Charolett Bumpers, [place] Patient will demonstrate during cognitive/linguistic activities the attention type of: Sustained   Problem: RH Awareness Goal: LTG: Patient will demonstrate awareness during functional activites type of (SLP) Description: LTG: Patient will demonstrate awareness during functional activites type of (SLP) Flowsheets Taken 07/16/2022 1138 by Helaine Chess A, CCC-SLP LTG: Patient will demonstrate awareness during cognitive/linguistic activities with assistance of (SLP): Maximal Assistance - Patient 25 - 49% Taken 07/06/2022 1250 by Charolett Bumpers, [place] Patient will demonstrate during  cognitive/linguistic activities awareness type of: Emergent

## 2022-07-16 NOTE — Progress Notes (Signed)
Occupational Therapy Session Note  Patient Details  Name: Briana Sweeney MRN: [id number] Date of Birth: 12-03-1970  Today's Date: 07/16/2022 OT Individual Time: 0350-0938 OT Individual Time Calculation (min): 75 min    Short Term Goals: Week 2:  OT Short Term Goal 1 (Week 2): Pt will perform shower seated with grab br support for breif stanidng with mod I, AE/DME OT Short Term Goal 2 (Week 2): Pt will stand for counter level task in kitchen for up to 2-3 minutes with support and mod I  Skilled Therapeutic Interventions/Progress Updates:   Pt seen for full self care am retraining visit. Plan was for full shower however R residual limb was draining at incision as per team and redressed therefore modified bathing hair washing with hair tray over shower in w/c and then w/c level sink side sponge bathing. Pt overall required CGA for LB bathing and dressing with increased time including limb protector donning. Pt stood with CGA for lower body garment mngt with UE support for functional time. UB self care indep w/c level. Reacher training and integration throughout session for falls prevention and overall mod fading to min cues for w/c brakes management. OT will add posted commands for safe steps of transfers next session. Left pt up in w/c with chair alarm set, needs and nurse call button in reach.    Therapy Documentation Precautions:  Precautions Precautions: Fall Precaution Comments: Reviewed verbally Restrictions Weight Bearing Restrictions: Yes RLE Weight Bearing: Non weight bearing    Therapy/Group: Individual Therapy  Barnabas Lister 07/16/2022, 7:48 AM

## 2022-07-16 NOTE — Progress Notes (Signed)
Patient ID: Briana Sweeney, female   DOB: 02/25/1971, 51 y.o.   MRN: [id number]  Met with pt to discuss team conference progress this week toward her goals and discharge date 11/13. Pt feels she is doing better and can get to the bathroom on her own but will not until the therapy team lets her. Not sure will get family training prior to discharge due to paster and wife and neighbor may not be able to come in and will just be checking on her at discharge. Will see if can prior to Monday. Have ordered her equipment and will make sure delivered prior to discharge. Continue to work on discharge needs. 

## 2022-07-17 ENCOUNTER — Inpatient Hospital Stay: Payer: No Typology Code available for payment source

## 2022-07-17 DIAGNOSIS — M869 Osteomyelitis, unspecified: Secondary | ICD-10-CM | POA: Diagnosis not present

## 2022-07-17 LAB — CBC WITH DIFFERENTIAL/PLATELET
Abs Immature Granulocytes: 0.01 10*3/uL (ref 0.00–0.07)
Basophils Absolute: 0 10*3/uL (ref 0.0–0.1)
Basophils Relative: 0 %
Eosinophils Absolute: 0.2 10*3/uL (ref 0.0–0.5)
Eosinophils Relative: 5 %
HCT: 32.8 % — ABNORMAL LOW (ref 36.0–46.0)
Hemoglobin: 10.8 g/dL — ABNORMAL LOW (ref 12.0–15.0)
Immature Granulocytes: 0 %
Lymphocytes Relative: 26 %
Lymphs Abs: 1.2 10*3/uL (ref 0.7–4.0)
MCH: 30 pg (ref 26.0–34.0)
MCHC: 32.9 g/dL (ref 30.0–36.0)
MCV: 91.1 fL (ref 80.0–100.0)
Monocytes Absolute: 0.8 10*3/uL (ref 0.1–1.0)
Monocytes Relative: 16 %
Neutro Abs: 2.5 10*3/uL (ref 1.7–7.7)
Neutrophils Relative %: 53 %
Platelets: 76 10*3/uL — ABNORMAL LOW (ref 150–400)
RBC: 3.6 MIL/uL — ABNORMAL LOW (ref 3.87–5.11)
RDW: 24 % — ABNORMAL HIGH (ref 11.5–15.5)
WBC: 4.8 10*3/uL (ref 4.0–10.5)
nRBC: 0 % (ref 0.0–0.2)

## 2022-07-17 LAB — BASIC METABOLIC PANEL
Anion gap: 11 (ref 5–15)
BUN: 9 mg/dL (ref 6–20)
CO2: 23 mmol/L (ref 22–32)
Calcium: 8.2 mg/dL — ABNORMAL LOW (ref 8.9–10.3)
Chloride: 99 mmol/L (ref 98–111)
Creatinine, Ser: 0.5 mg/dL (ref 0.44–1.00)
GFR, Estimated: >60 mL/min (ref >60)
Glucose, Bld: 108 mg/dL — ABNORMAL HIGH (ref 70–99)
Potassium: 4.1 mmol/L (ref 3.5–5.1)
Sodium: 133 mmol/L — ABNORMAL LOW (ref 135–145)

## 2022-07-17 MED ORDER — OXYMETAZOLINE HCL 0.05 % NA SOLN
1.0000 | Freq: Two times a day (BID) | NASAL | Status: DC
  Filled 2022-07-17: qty 30

## 2022-07-17 MED ORDER — OXYMETAZOLINE HCL 0.05 % NA SOLN: 1.0000 | NASAL | Status: DC | PRN

## 2022-07-17 MED ORDER — FLUOXETINE HCL 20 MG PO CAPS
20.0000 mg | ORAL_CAPSULE | Freq: Every day | ORAL | Status: DC
  Administered 2022-07-17 – 2022-07-22 (×6): 20 mg via ORAL
  Filled 2022-07-17 (×5): qty 1

## 2022-07-17 MED ORDER — DULOXETINE HCL 30 MG PO CPEP
30.0000 mg | ORAL_CAPSULE | Freq: Every day | ORAL | Status: DC
  Administered 2022-07-17 – 2022-07-18 (×2): 30 mg via ORAL
  Filled 2022-07-17 (×2): qty 1

## 2022-07-17 NOTE — Progress Notes (Signed)
Physical Therapy Session Note  Patient Details  Name: Briana Sweeney MRN: [id number] Date of Birth: 1971-04-24  Today's Date: 07/17/2022 PT Individual Time: 0802-0830, [id number] PT Individual Time Calculation (min): 28 min, 44 min   Short Term Goals: Week 2:  PT Short Term Goal 1 (Week 2): STG=LTG due to extended length of stay  Skilled Therapeutic Interventions/Progress Updates:      Therapy Documentation Precautions:  Precautions Precautions: Fall Precaution Comments: Reviewed verbally Restrictions Weight Bearing Restrictions: Yes RLE Weight Bearing: Non weight bearing  Treatment Session 1:  Pt received semi-reclined in bed and reports recent nose bleed, PT notified nursing and monitored throughout session. Pt reports 7/10 right residual limb pain and was pre-medicated. Pt independent with bed mobility and requires supervision for safety with dynamic sitting balance edge of bed with ipsilateral and contralateral reaching while performing self-grooming tasks. Pt requests need to toilet and requires supervision for safety with stand pivot transfer with RW to [facility]. Pt attempted to stand prior to [facility] placement and requires verbal cues for safety and appropriate AD management. Pt voided and with +BM. Pt requires CGA for safety with dynamic standing with no AD while performing contralateral punches outside her base of support 3 x 10. Pt left semi-reclined in bed with all needs in reach and bed alarm on.   Treatment Session 2:  Pt received seated in w/c at bedside and agreeable to PT session with emphasis on building UE activity tolerance and w/c part management and mobility. Pt without verbal reports of pain during session. Pt propelled w/c to ortho gym and navigated 10 ft ramp with CGA for ascending and descending. Pt transported outside by w/c for time management and energy conservation. Pt participated in community integration and negotiated ~50 ft ramp ascending/descending with CGA and  verbal cues for obstacle negotiation. Pt requires increased time for w/c ramp navigation due to decreased strength and activity tolerance. Pt transported to room and requested need to toilet and requires min A for toilet transfer. Pt left semi-reclined in bed with all needs in reach and bed alarm on.   Therapy/Group: Individual Therapy  Verl Dicker Verl Dicker PT, DPT  07/17/2022, 7:25 AM

## 2022-07-17 NOTE — Progress Notes (Signed)
PROGRESS NOTE   Subjective/Complaints:  Frustrated with so many BM's- but knows it helps her get rid of "toxins" from liver issues.  C/O more pain in R BKA this AM ROS:   Pt denies SOB, abd pain, CP, N/V/C/ (+)D, and vision changes  Except for HPI     Objective:   No results found. Recent Labs    07/15/22 0339 07/17/22 0255  WBC 5.8 4.8  HGB 10.6* 10.8*  HCT 32.5* 32.8*  PLT 120* 76*    Recent Labs    07/15/22 0339 07/17/22 0255  NA 128* 133*  K 4.0 4.1  CL 96* 99  CO2 25 23  GLUCOSE 92 108*  BUN 7 9  CREATININE 0.59 0.50  CALCIUM 7.6* 8.2*     Intake/Output Summary (Last 24 hours) at 07/17/2022 0804 Last data filed at 07/17/2022 0746 Gross per 24 hour  Intake 574 ml  Output --  Net 574 ml        Physical Exam: Vital Signs Blood pressure (!) 90/52, pulse 91, temperature 98.1 F (36.7 C), resp. rate 16, height '4\' 11"'$  (1.499 m), weight 62.1 kg, SpO2 91 %.        BP a little low- 90/52 this AM- asymptomatic General: awake, alert, appropriate, sitting up in EOB; NAD HENT: conjugate gaze; oropharynx moist CV: regular rate; no JVD Pulmonary: CTA B/L; no W/R/R- good air movement GI: soft, NT, ND, (+)BS- not distended yet Psychiatric: appropriate Neurological: alert  MS: R BKA has some drainage lateral aspect and a skin tear bleeding on posterior aspect- pinpoint-  Extremities: No clubbing, cyanosis Skin: incision CDI with staples, minimal s/s discharge--black shrinker Neurologic: Cranial nerves II through XII intact, motor strength is 5/5 in bilateral deltoid, bicep, tricep, grip, B hip flexor,left  knee extensors, ankle dorsiflexor and plantar flexor  Musculoskeletal: Full range of motion in all 4 extremities. No joint swelling       Assessment/Plan: 1. Functional deficits which require 3+ hours per day of interdisciplinary therapy in a comprehensive inpatient rehab  setting. Physiatrist is providing close team supervision and 24 hour management of active medical problems listed below. Physiatrist and rehab team continue to assess barriers to discharge/monitor patient progress toward functional and medical goals  Care Tool:  Bathing    Body parts bathed by patient: Right arm, Left arm, Chest, Abdomen, Front perineal area, Right upper leg, Left upper leg, Left lower leg, Face, Buttocks   Body parts bathed by helper: Buttocks Body parts n/a: Right lower leg   Bathing assist Assist Level: Supervision/Verbal cueing     Upper Body Dressing/Undressing Upper body dressing   What is the patient wearing?: Pull over shirt, Bra    Upper body assist Assist Level: Independent    Lower Body Dressing/Undressing Lower body dressing      What is the patient wearing?: Underwear/pull up, Pants     Lower body assist Assist for lower body dressing: Supervision/Verbal cueing     Toileting Toileting    Toileting assist Assist for toileting: Set up assist Assistive Device Comment: walker, bsc   Transfers Chair/bed transfer  Transfers assist     Chair/bed transfer assist level: Contact Guard/Touching  assist     Locomotion Ambulation   Ambulation assist   Ambulation activity did not occur: Safety/medical concerns (2/2 wound vac on R BKA)          Walk 10 feet activity   Assist  Walk 10 feet activity did not occur: Safety/medical concerns (2/2 wound vac on R BKA)        Walk 50 feet activity   Assist Walk 50 feet with 2 turns activity did not occur: Safety/medical concerns (2/2 wound vac on R BKA)         Walk 150 feet activity   Assist Walk 150 feet activity did not occur: Safety/medical concerns (2/2 wound vac on R BKA)         Walk 10 feet on uneven surface  activity   Assist Walk 10 feet on uneven surfaces activity did not occur: Safety/medical concerns (2/2 wound vac on R BKA)          Wheelchair     Assist Is the patient using a wheelchair?: Yes Type of Wheelchair: Manual    Wheelchair assist level: Minimal Assistance - Patient > 75% Max wheelchair distance: 175 feet (per PT report)    Wheelchair 50 feet with 2 turns activity    Assist        Assist Level: Minimal Assistance - Patient > 75% (per PT report, pt completed 175 feet Min A)   Wheelchair 150 feet activity     Assist      Assist Level: Minimal Assistance - Patient > 75% (Per PT report, pt completed 175 feet Min A)   Blood pressure (!) 90/52, pulse 91, temperature 98.1 F (36.7 C), resp. rate 16, height '4\' 11"'$  (1.499 m), weight 62.1 kg, SpO2 91 %.  Medical Problem List and Plan: 1. Functional deficits secondary to right BKA             -patient may shower with tubing clamped, vac disconnected. Do not submerge.              -ELOS/Goals: 10-14 days             - Lives alone in handicapped accessible 1 floor apartment    D/c date 11/13  Con't CIR- PT, OT and SLP 2.  Antithrombotics: -DVT/anticoagulation:  Pharmaceutical: Lovenox             -antiplatelet therapy: none             -LUE duplex ordered on admission for edema   10/31- will order LLE Dopplers to rule out DVT since has abrupt onset/worsening LLE edema  11/1- Dopplers (-)- done today- due to continuing bleeding/hemorrhoids and hx fof frequent plt transfusions, will stop lovenox per pt request 3. Pain Management: Tylenol, Robaxin, Dilaudid 4 mg q 4 hours as needed   10/30- will add gabapentin 300 mg BID- goal to get off pain meds by d/c that are controlled.   10/31- still having pain, but explained that phantom pain doesn't respond to opiates- it responds to nerve pain meds- said has tolerated gabapentin in past  11/1- increased gabapentin to 300 mg QID  11/3- increased gabapentin to 600 mg TID- will look at weaning dilaudid next week  11/4-5 encouraged pt to massage, rub stump. Continue meds as above  11/6- will reduce  Dilaudid to q6 hours prn  11/7- educated pt will con't to wean Dilaudid and will not go home on it.   11/8- tomorrow, will decrease to q8 hours 4. Mood/Behavior/Sleep: LCSW  to evaluate and provide emotional support             -h/o bipolar>>continue Abilify, Prozac             -h/o anxiety>>continue clonazepam 0.5 mg BID as needed             -antipsychotic agents: n/a   5. Neuropsych/cognition: This patient is capable of making decisions on her own behalf.             -SLP eval for difficulty with higher cognitive tasks   11/1- has higher level cognitive issues- will need assist for money/med mgmt. [facility] [place] 6. Skin/Wound Care: Routine skin care checks             -  maintain negative pressure dressing dressing through next Tuesday 10/31             - Wound vac per ortho             10/31- will write for [facility] removal  11/1- removed- has two new small sacral spots  11/4- wound looks ok. Can use simply silver infused shrinker per Dr. Sharol Given protocol.   11/7- a little more red? Not sure if due to pressure of limb guard? Will recheck CBC in AM- already on IV ABX.   11/8- WBC 4.8- and less erythema on exam 7. 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 Juleen China: At discharge from rehab, DC PICC after 1x dose of oritavancin; then transition to oral linezolid to complete course at 08/65   9: Alcoholic cirrhosis/ascites/elevated LFTs/esophageal varices:             -continue Lasix 40 mg daily             -continue lactulose 20 grams BID (goal 2-3 bowel movements daily)             -continue nadolol 20 mg daily             -continue aldactone 100 mg daily             -continue Folvite, thiamine supplementation             -Gets OP  Qmonthly ascites taps; will need to arrange with IR while admitted   10/30- having 4-7 BM's day will need ot check with next paracentesis is needed  11/1- will get paracentesis today- got out 800cc  11/3- looks better  11/5 discussed with pt need for lactulose given liver disease  11/7- intermittently to more frequent refusal of lactulose- educated cannot refuse lactulose. Also refused hemorrhoid meds.   11/8- pt says having 4-7 Bms/day- educated ot not miss lactulose 10: Seizure disorder: continue Keppra   11. History polysubstance abuse            - Hx alcohol, THC - none current per patient            - Plan to DC picc prior to discharge as above   12. AKI on CKD stage 3. Admission labs. Baseline Cr Appears 0.4-0.7.       Latest Ref Rng & Units  07/17/2022    2:55 AM 07/15/2022    3:39 AM 07/11/2022    4:05 AM  BMP  Glucose 70 - 99 mg/dL 108  92  102   BUN 6 - 20 mg/dL '9  7  10   '$ Creatinine 0.44 - 1.00 mg/dL 0.50  0.59  0.50   Sodium 135 - 145 mmol/L 133  128  133   Potassium 3.5 - 5.1 mmol/L 4.1  4.0  3.9   Chloride 98 - 111 mmol/L 99  96  99   CO2 22 - 32 mmol/L '23  25  25   '$ Calcium 8.9 - 10.3 mg/dL 8.2  7.6  8.0     AKI resolved   13.  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 14. 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   11/1- up to 300 mg QID  11/3- increase to 600 mg TID  11/4 see pain discussion above in #3  11/8- will  add Duloxetine 30 mg nightly-  15. Hypokalemia  10/30- will replete 40 mEq x1 and recheck later in week  11/2- K+  3.9 16. LLE edema  10/31- will get Dopplers of LLE since has edema and has missed multiple doses of lovenox- due to refusal- also ordered TEDs for LLE  11/1- no DVT 17. Cough  10/31- ordered Tessalon pearles TID prn for cough- already has robitussin prn.  18. Anal bleeding/hemorrhoids (pt said she 11/1 - started anusol suppository, cream and witch hazel pads.  11/2- no bleeding in last 12+ hours-  18. Hyponatremia  11/6- Na down to 128- is unusual for her- will recheck Wednesday and monitor  11/7- labs in AM  11/8- Na up to 133-    I spent a total of 36   minutes on total care today- >50% coordination of care- due to complex medical issues- increasing meds for nerve pain and has lower plts- had ot decrease Prozac for Duloxetine.      .   LOS: 12 days A FACE TO FACE EVALUATION WAS PERFORMED  Briana Sweeney 07/17/2022, 8:04 AM

## 2022-07-17 NOTE — Evaluation (Signed)
Recreational Therapy Assessment and Plan  Patient Details  Name: Briana Sweeney MRN: [id number] Date of Birth: November 07, 1970 Today's Date: 07/17/2022  Rehab Potential:  Good  ELOS:   d/c 11/13  Assessment Hospital Problem: Principal Problem:   Osteomyelitis of right foot [facility])     Past Medical History:      Past Medical History:  Diagnosis Date   Abscess of bursa of left elbow 09/23/2018   Acute hyperactive alcohol withdrawal delirium ([place]) 10/09/2017   Acute low back pain with bilateral sciatica     Acute metabolic encephalopathy 81/82/9937   Acute on chronic pancreatitis ([place]) 04/04/2021   AKI (acute kidney injury) ([place]) 04/04/2021   Alcohol withdrawal syndrome with complication ([place]) 16/96/7893   Alcohol-induced acute pancreatitis     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   Ascites due to alcoholic cirrhosis (HCC)     Bipolar affective disorder ([place])      With anxiety features   Bunionette of right foot 11/2019   Cirrhosis of liver ([place])      Due to alcohol and hepatitis C   Cocaine dependence without complication ([place]) 81/09/7508   Decompensated hepatic cirrhosis ([place]) 07/23/2017   Depression with anxiety     Epistaxis 01/20/2019   Erysipelas 09/13/2018   Esophageal 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 (HCC)     Esophageal varices without bleeding (HCC)     ETOHism (HCC)     GERD (gastroesophageal reflux disease)     Head injury     Hematemesis 02/10/2018   Hepatitis C 2018    hepatitis c and alcohol related hepatitis   Heroin abuse ([place])     History of blood transfusion      "blood doesn't clot; I fell down and had to have a transfusion"   History of kidney stones     Menopause 2016   Migraine      "when I get really stressed" (09/01/2017)   Neuropathy 10/27/2018   Olecranon bursitis of left elbow     Other mixed anxiety disorders 10/27/2018   Overdose 08/22/2018   Pancytopenia ([place]) 07/23/2017   Polysubstance abuse ([place]) 04/08/2018   Portal hypertension ([place])  03/18/2018   S/P foot surgery, right 11/2019   Schizophrenia ([place])     Seizures ([place])      "when I run out of my RX; lots recently" (09/01/2017)   Suicidal ideation     Thrombocytopenia ([place])     Trichimoniasis 10/30/2017   Upper GI bleed 02/10/2018   Urinary urgency 05/2020   UTI (urinary tract infection) 06/02/2017    Past Surgical History:       Past Surgical History:  Procedure Laterality Date   AMPUTATION Right 07/02/2022    Procedure: RIGHT BELOW KNEE AMPUTATION;  Surgeon: Newt Minion, MD;  Location: [place];  Service: Orthopedics;  Laterality: Right;   APPLICATION OF WOUND VAC Right 07/02/2022    Procedure: APPLICATION OF WOUND VAC;  Surgeon: Newt Minion, MD;  Location: [place];  Service: Orthopedics;  Laterality: Right;   BUNIONECTOMY       ESOPHAGEAL BANDING   06/26/2021    Procedure: ESOPHAGEAL BANDING;  Surgeon: Jackquline Denmark, MD;  Location: [facility] ENDOSCOPY;  Service: Endoscopy;;   ESOPHAGOGASTRODUODENOSCOPY N/A 09/03/2017    Procedure: ESOPHAGOGASTRODUODENOSCOPY (EGD);  Surgeon: Doran Stabler, MD;  Location: [place];  Service: Gastroenterology;  Laterality: N/A;   ESOPHAGOGASTRODUODENOSCOPY N/A 02/01/2022    Procedure: ESOPHAGOGASTRODUODENOSCOPY (EGD);  Surgeon: Clarene Essex, MD;  Location: [facility];  Service: Gastroenterology;  Laterality:  N/A;   ESOPHAGOGASTRODUODENOSCOPY (EGD) WITH PROPOFOL N/A 06/26/2021    Procedure: ESOPHAGOGASTRODUODENOSCOPY (EGD) WITH PROPOFOL;  Surgeon: Jackquline Denmark, MD;  Location: [facility] END[facility]COPY;  Service: Endoscopy;  Laterality: N/A;   FINGER FRACTURE SURGERY Left      "shattered my pinky"   FRACTURE SURGERY       I & D EXTREMITY Left 09/18/2018    Procedure: IRRIGATION AND DEBRIDEMENT EXTREMITY;  Surgeon: Leanora Cover, MD;  Location: [facility];  Service: Orthopedics;  Laterality: Left;   IR PARACENTESIS   07/23/2017   IR PARACENTESIS   07/2017    "did it twice in the same week" (09/01/2017)   IR PARACENTESIS   06/26/2021   SHOULDER  OPEN ROTATOR CUFF REPAIR Right     TOOTH EXTRACTION   08/2019   TUBAL LIGATION       VAGINAL HYSTERECTOMY          Assessment & Plan Clinical Impression: 51 y.o. female admitted 10/21 with Cellulitis and osteomyelitis of right foot, Acute renal failure superimposed on stage 3a chronic kidney disease, Severe protein-calorie malnutrition, Hepatic cirrhosis. 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    MEt with pt today.  Pt presents with decreased activity tolerance, decreased functional mobility, decreased balance, decreased memory, decreased safety awareness Limiting pt's independence with leisure/community pursuits.   Plan   Min 1 TR session >20 minutes during LOS Recommendations for other services: None   Discharge Criteria: Patient will be discharged from TR if patient refuses treatment 3 consecutive times without medical reason.  If treatment goals not met, if there is a change in medical status, if patient makes no progress towards goals or if patient is discharged from hospital.  The above assessment, treatment plan, treatment alternatives and goals were discussed and mutually agreed upon: by patient  [place] 07/17/2022, 3:05 PM

## 2022-07-17 NOTE — Progress Notes (Signed)
Occupational Therapy Session Note  Patient Details  Name: Radiah Lubinski MRN: [id number] Date of Birth: 17-Jul-1971  Today's Date: 07/17/2022 OT Individual Time: 4098-1191 OT Individual Time Calculation (min): 60 min    Short Term Goals: Week 2:  OT Short Term Goal 1 (Week 2): Pt will perform shower seated with grab br support for breif stanidng with mod I, AE/DME OT Short Term Goal 2 (Week 2): Pt will stand for counter level task in kitchen for up to 2-3 minutes with support and mod I  Skilled Therapeutic Interventions/Progress Updates:    Pt seen for skilled OT am session. Pt w/c level already and was able to gather soiled laundry from lower drawer and place in plastic bag for laundering with mod I. Pt then moved self w/c level sinkside for full am self care routine including UB/LB bathing, dressing, grooming w/c level with intermittent standing with sink side support. Needed only min cues for w/c brakes reminders and set up for LB self care otherwise mod I. OT wrote out steps of safe w/c management and R residual limb protector use and posted one over bed and copied 3 others for home posting carryover due to STM delays. Pt then able to self propel 100 ft to laundry area where OT placed laundry in washer due to pt on contact precautions. Then self propelled back to room same distance. Pt left up in w/c with chair alarm set, needs and nurse call button in reach.   Therapy Documentation Precautions:  Precautions Precautions: Fall Precaution Comments: Reviewed verbally Restrictions Weight Bearing Restrictions: Yes RLE Weight Bearing: Non weight bearing     Therapy/Group: Individual Therapy  Barnabas Lister 07/17/2022, 7:46 AM

## 2022-07-17 NOTE — Progress Notes (Signed)
Occupational Therapy Session Note  Patient Details  Name: Briana Sweeney MRN: [id number] Date of Birth: 06-11-1971  Today's Date: 07/17/2022 OT Individual Time: 1104-1200 OT Individual Time Calculation (min): 56 min    Short Term Goals: Week 1:  OT Short Term Goal 1 (Week 1): Pt will complete toilet transfers with supervision OT Short Term Goal 1 - Progress (Week 1): Progressing toward goal OT Short Term Goal 2 (Week 1): Pt will complete 2/3 components of toileting with supervision OT Short Term Goal 2 - Progress (Week 1): Met OT Short Term Goal 3 (Week 1): Pt will don LB clothing with CGA at sit to stand level OT Short Term Goal 3 - Progress (Week 1): Progressing toward goal OT Short Term Goal 4 (Week 1): Pt will initate IADL retraining in prep for living alone OT Short Term Goal 4 - Progress (Week 1): Progressing toward goal  Skilled Therapeutic Interventions/Progress Updates:    Pain reported during session as 4/10. Meds provided for pain relief prior to session. Patient actively participated in the [facility] [place] program in a individual setting with other person also completingthe program with another therapist 1:1 for social participation. Session focused on education and training in breathing techniques to regulate the nervous system, gentle yoga poses with a focus on trunk control, core strength and stretching, guided meditation to regulate attention and guided discussion on the topic of resilience. Skilled treatment interventions include. Patient required mod demonstration cues and supervision assist for seated yoga postures. Exited session with pt seated in w/c, exit alarm on and call light in reach   Therapy Documentation Precautions:  Precautions Precautions: Fall Precaution Comments: Reviewed verbally Restrictions Weight Bearing Restrictions: Yes RLE Weight Bearing: Non weight bearing General:   Therapy/Group: individual  Tonny [place] 07/17/2022, 6:48  AM

## 2022-07-18 ENCOUNTER — Ambulatory Visit: Payer: Medicaid Other

## 2022-07-18 DIAGNOSIS — M869 Osteomyelitis, unspecified: Secondary | ICD-10-CM | POA: Diagnosis not present

## 2022-07-18 MED ORDER — OXYMETAZOLINE HCL 0.05 % NA SOLN
1.0000 | NASAL | Status: DC | PRN
  Administered 2022-07-18: 1 via NASAL
  Filled 2022-07-18: qty 30

## 2022-07-18 MED ORDER — HYDROCORTISONE (PERIANAL) 2.5 % EX CREA
TOPICAL_CREAM | Freq: Two times a day (BID) | CUTANEOUS | Status: DC | PRN
  Filled 2022-07-18: qty 28.35

## 2022-07-18 MED ORDER — HYDROMORPHONE HCL 2 MG PO TABS
4.0000 mg | ORAL_TABLET | Freq: Three times a day (TID) | ORAL | Status: DC | PRN
  Administered 2022-07-18 – 2022-07-19 (×3): 4 mg via ORAL
  Filled 2022-07-18 (×3): qty 2

## 2022-07-18 NOTE — Progress Notes (Signed)
PROGRESS NOTE   Subjective/Complaints:  Pt repots needs new shrinker- hers has ripped a little.  Also only has 1.   Had nose bleed yesterday but room was real hot and thinks nose was dry.    Also thinks I haven't looked at her R BKA in "3-4 days"- explained was reassessed Tuesday by me and nursing yesterday.   I assessed today again.   ROS:  Pt denies SOB, abd pain, CP, N/V/C/D, and vision changes  Except for HPI     Objective:   No results found. Recent Labs    07/17/22 0255  WBC 4.8  HGB 10.8*  HCT 32.8*  PLT 76*    Recent Labs    07/17/22 0255  NA 133*  K 4.1  CL 99  CO2 23  GLUCOSE 108*  BUN 9  CREATININE 0.50  CALCIUM 8.2*     Intake/Output Summary (Last 24 hours) at 07/18/2022 0837 Last data filed at 07/18/2022 0720 Gross per 24 hour  Intake 890 ml  Output --  Net 890 ml        Physical Exam: Vital Signs Blood pressure (!) 90/56, pulse 84, temperature 98.5 F (36.9 C), temperature source Oral, resp. rate 20, height '4\' 11"'$  (1.499 m), weight 63.7 kg, SpO2 92 %.        Denies Sx's from BP 90.56 General: awake, alert, appropriate, sitting up EOB; concerned that "hasn't had anyone look at leg for 3-4 days".  NAD HENT: conjugate gaze; oropharynx moist CV: regular rate; no JVD Pulmonary: CTA B/L; no W/R/R- good air movement GI: soft, NT, ND, (+)BS- hasn't reaccumulated fluid fast Psychiatric: poor memory; flat affect Neurological: alert- but confused on days/memory- I assessed R BKA Tuesday and today again MS: R BKA has some drainage lateral aspect and a skin tear bleeding on posterior aspect- pinpoint-  Extremities: No clubbing, cyanosis Skin: incision CDI with staples, minimal s/s discharge--black shrinker Neurologic: Cranial nerves II through XII intact, motor strength is 5/5 in bilateral deltoid, bicep, tricep, grip, B hip flexor,left  knee extensors, ankle dorsiflexor and  plantar flexor  Musculoskeletal: Full range of motion in all 4 extremities. No joint swelling    Above 11/7  Today 11/9    Assessment/Plan: 1. Functional deficits which require 3+ hours per day of interdisciplinary therapy in a comprehensive inpatient rehab setting. Physiatrist is providing close team supervision and 24 hour management of active medical problems listed below. Physiatrist and rehab team continue to assess barriers to discharge/monitor patient progress toward functional and medical goals  Care Tool:  Bathing    Body parts bathed by patient: Right arm, Left arm, Chest, Abdomen, Front perineal area, Right upper leg, Left upper leg, Left lower leg, Face, Buttocks   Body parts bathed by helper: Buttocks Body parts n/a: Right lower leg   Bathing assist Assist Level: Set up assist     Upper Body Dressing/Undressing Upper body dressing   What is the patient wearing?: Pull over shirt, Bra    Upper body assist Assist Level: Independent    Lower Body Dressing/Undressing Lower body dressing      What is the patient wearing?: Underwear/pull up, Pants  Lower body assist Assist for lower body dressing: Set up assist     Toileting Toileting    Toileting assist Assist for toileting: Set up assist Assistive Device Comment: walker, bsc   Transfers Chair/bed transfer  Transfers assist     Chair/bed transfer assist level: Contact Guard/Touching assist     Locomotion Ambulation   Ambulation assist   Ambulation activity did not occur: Safety/medical concerns (2/2 wound vac on R BKA)          Walk 10 feet activity   Assist  Walk 10 feet activity did not occur: Safety/medical concerns (2/2 wound vac on R BKA)        Walk 50 feet activity   Assist Walk 50 feet with 2 turns activity did not occur: Safety/medical concerns (2/2 wound vac on R BKA)         Walk 150 feet activity   Assist Walk 150 feet activity did not occur:  Safety/medical concerns (2/2 wound vac on R BKA)         Walk 10 feet on uneven surface  activity   Assist Walk 10 feet on uneven surfaces activity did not occur: Safety/medical concerns (2/2 wound vac on R BKA)         Wheelchair     Assist Is the patient using a wheelchair?: Yes Type of Wheelchair: Manual    Wheelchair assist level: Minimal Assistance - Patient > 75% Max wheelchair distance: 175 feet (per PT report)    Wheelchair 50 feet with 2 turns activity    Assist        Assist Level: Minimal Assistance - Patient > 75% (per PT report, pt completed 175 feet Min A)   Wheelchair 150 feet activity     Assist      Assist Level: Minimal Assistance - Patient > 75% (Per PT report, pt completed 175 feet Min A)   Blood pressure (!) 90/56, pulse 84, temperature 98.5 F (36.9 C), temperature source Oral, resp. rate 20, height '4\' 11"'$  (1.499 m), weight 63.7 kg, SpO2 92 %.  Medical Problem List and Plan: 1. Functional deficits secondary to right BKA             -patient may shower with tubing clamped, vac disconnected. Do not submerge.              -ELOS/Goals: 10-14 days             - Lives alone in handicapped accessible 1 floor apartment    D/c date 11/13  Con't CIR- PT, OT and SLP- ordered 2 new shrinkers/Dr Sharol Given' shinkers 2.  Antithrombotics: -DVT/anticoagulation:  Pharmaceutical: Lovenox             -antiplatelet therapy: none             -LUE duplex ordered on admission for edema   10/31- will order LLE Dopplers to rule out DVT since has abrupt onset/worsening LLE edema  11/1- Dopplers (-)- done today- due to continuing bleeding/hemorrhoids and hx fof frequent plt transfusions, will stop lovenox per pt request 3. Pain Management: Tylenol, Robaxin, Dilaudid 4 mg q 4 hours as needed   10/30- will add gabapentin 300 mg BID- goal to get off pain meds by d/c that are controlled.   10/31- still having pain, but explained that phantom pain doesn't respond  to opiates- it responds to nerve pain meds- said has tolerated gabapentin in past  11/1- increased gabapentin to 300 mg QID  11/3- increased gabapentin  to 600 mg TID- will look at weaning dilaudid next week  11/4-5 encouraged pt to massage, rub stump. Continue meds as above  11/6- will reduce Dilaudid to q6 hours prn  11/7- educated pt will con't to wean Dilaudid and will not go home on it.   11/9- decrease Dilaudid to q8 hours prn- with goal of stopping by d/c.  4. Mood/Behavior/Sleep: LCSW to evaluate and provide emotional support             -h/o bipolar>>continue Abilify, Prozac             -h/o anxiety>>continue clonazepam 0.5 mg BID as needed             -antipsychotic agents: n/a   5. Neuropsych/cognition: This patient is capable of making decisions on her own behalf.             -SLP eval for difficulty with higher cognitive tasks   11/1- has higher level cognitive issues- will need assist for money/med mgmt. [facility] [place] 6. Skin/Wound Care: Routine skin care checks             -  maintain negative pressure dressing dressing through next Tuesday 10/31             - Wound vac per ortho             10/31- will write for [facility] removal  11/1- removed- has two new small sacral spots  11/4- wound looks ok. Can use simply silver infused shrinker per Dr. Sharol Given protocol.   11/7- a little more red? Not sure if due to pressure of limb guard? Will recheck CBC in AM- already on IV ABX.   11/8- WBC 4.8- and less erythema on exam 11/9- A little trace red drainage from incision, however less erythema- likely due to wearing shrinker? 7. 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 Juleen China: At discharge from rehab, DC PICC after 1x dose of  oritavancin; then transition to oral linezolid to complete course at 67/59   9: Alcoholic cirrhosis/ascites/elevated LFTs/esophageal varices:             -continue Lasix 40 mg daily             -continue lactulose 20 grams BID (goal 2-3 bowel movements daily)             -continue nadolol 20 mg daily             -continue aldactone 100 mg daily             -continue Folvite, thiamine supplementation             -Gets OP Qmonthly ascites taps; will need to arrange with IR while admitted   10/30- having 4-7 BM's day will need ot check with next paracentesis is needed  11/1- will get paracentesis today- got out 800cc  11/3- looks better  11/5 discussed with pt need for lactulose given liver disease  11/7- intermittently to more frequent refusal of lactulose- educated cannot refuse lactulose. Also refused hemorrhoid meds.   11/8- pt says having 4-7  Bms/day- educated ot not miss lactulose 10: Seizure disorder: continue Keppra   11. History polysubstance abuse            - Hx alcohol, THC - none current per patient            - Plan to DC picc prior to discharge as above   12. AKI on CKD stage 3. Admission labs. Baseline Cr Appears 0.4-0.7.       Latest Ref Rng & Units 07/17/2022    2:55 AM 07/15/2022    3:39 AM 07/11/2022    4:05 AM  BMP  Glucose 70 - 99 mg/dL 108  92  102   BUN 6 - 20 mg/dL '9  7  10   '$ Creatinine 0.44 - 1.00 mg/dL 0.50  0.59  0.50   Sodium 135 - 145 mmol/L 133  128  133   Potassium 3.5 - 5.1 mmol/L 4.1  4.0  3.9   Chloride 98 - 111 mmol/L 99  96  99   CO2 22 - 32 mmol/L '23  25  25   '$ Calcium 8.9 - 10.3 mg/dL 8.2  7.6  8.0     AKI resolved   13.  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  11/9- will recheck in AM- off lovenox 14. 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   11/1- up to 300 mg QID  11/3- increase to 600 mg TID  11/4 see pain discussion above in #3  11/8- will add Duloxetine 30 mg nightly-   11/9- risk benefit ratio of Duloxetine with low plts- and nerve pain that cannot use opiates due to polysubstance abuse after d/c.  15. Hypokalemia  10/30- will replete 40 mEq x1 and recheck later in week  11/2- K+  3.9 16. LLE edema  10/31- will get Dopplers of LLE since has edema and has missed multiple doses of lovenox- due to refusal- also ordered TEDs for LLE  11/1- no DVT 17. Cough  10/31- ordered Tessalon pearles TID prn for cough- already has robitussin prn. 11/9- resolved  18. Anal bleeding/hemorrhoids (pt said she 11/1 - started anusol suppository, cream and witch hazel pads.  11/2- no bleeding in last 12+ hours-   11/9- hemorrhoids- no bleeding lately- has been refusing Anusol- will make prn cream, but d/c Supp.  18. Hyponatremia  11/6- Na down to 128- is unusual for her- will recheck Wednesday and monitor  11/7- labs in AM  11/8- Na up to 133-  19. Epistaxis  11/9- stopped spontaneously- likely due ot low plts and dry nose- will order Afrin prn   I spent a total of 35   minutes on total care today- >50% coordination  of care- due to d/w nursing, prolonged time with pt and complex medical issues- as detaile din note   .   LOS: 13 days A FACE TO FACE EVALUATION WAS PERFORMED  Briana Sweeney 07/18/2022, 8:37 AM

## 2022-07-18 NOTE — Progress Notes (Signed)
Recreational Therapy Session Note  Patient Details  Name: Briana Sweeney MRN: [id number] Date of Birth: 07/21/1971 Today's Date: 07/18/2022  Pain: no c/o Skilled Therapeutic Interventions/Progress Updates: Session focused on amputee support and limb management, activity tolerance, and dynamic standing balance during co-treat/group with PT.  Goal:  Pt will maintain dynamic standing balance with contact guard assistance for mod complex task.  MET  Pt participated in group discussion about residual limb management & desensitization techniques.  PT provided information on role of limb guard & shrinker.  Pt demonstrated donning/doffing both with supervision.    Transitioned to standing balance activity playing corn hole.  Pt stood with close supervision without UE support for corn hole.  Therapy/Group: Co-Treatment  , 07/18/2022, 3:37 PM  

## 2022-07-18 NOTE — Progress Notes (Signed)
Physical Therapy Session Note  Patient Details  Name: Briana Sweeney MRN: [id number] Date of Birth: 1970/10/25  Today's Date: 07/18/2022 PT Individual Time: 1020-1050 PT Individual Time Calculation (min): 30 min   Short Term Goals: Week 2:  PT Short Term Goal 1 (Week 2): STG=LTG due to extended length of stay  Skilled Therapeutic Interventions/Progress Updates: Pt presented in bed agreeable to therapy. Pt states some pain in residual limb but did not rate. Pt performed bed mobility with supervision and performed modified squat pivot transfer to w/c with CGA. Pt propelled to rehab gym with supervision and increased time for endurance and general conditioning. Pt participated in obstacle course weaving through cones for improved w/c management. Pt was able to weave through cones spaced 60f apart with good safety. Pt also participated in use of reacher while picking up bean bags weaving around gym area. Pt then transported to ortho gym and participated in in UBE L4 x 6 min (3 forward/3 backwards). Pt transported to day room and left for group session with current needs met.      Therapy Documentation Precautions:  Precautions Precautions: Fall Precaution Comments: Reviewed verbally Restrictions Weight Bearing Restrictions: Yes RLE Weight Bearing: Non weight bearing General:   Vital Signs: Therapy Vitals Temp: 98.7 F (37.1 C) Pulse Rate: 80 Resp: 20 BP: 92/65 Patient Position (if appropriate): Lying Oxygen Therapy SpO2: 92 % Pain:   Mobility:   Locomotion :    Trunk/Postural Assessment :    Balance:   Exercises:   Other Treatments:      Therapy/Group: Individual Therapy  [name] 07/18/2022, 4:14 PM

## 2022-07-18 NOTE — Progress Notes (Addendum)
Speech Language Pathology Daily Session Note  Patient Details  Name: Briana Sweeney MRN: [id number] Date of Birth: 1970-11-18  Today's Date: 07/18/2022 SLP Missed Time: 80 Minutes Missed Time Reason: Patient fatigue  Upon arrival pt was in bed and reported she has not felt well today secondary to fatigue and "pooping all day." Pt requesting to defer treatment at this time. SLP assisted with comfort needs by providing pt an additional blanket. No additional needs identified at this time. Nurse aware. Pt left with bed alarm activated and with call bell and immediate needs within reach. Will attempt to make up time as schedule allows.   [name] 07/18/2022, 5:25 PM

## 2022-07-18 NOTE — Progress Notes (Signed)
Orthopedic Tech Progress Note Patient Details:  Briana Sweeney 06/05/1971 [id number] Called in order to [employer] for 2 shrinkers Patient ID: [name], female   DOB: 10-29-1970, 51 y.o.   MRN: [id number]  Chip Boer 07/18/2022, 10:42 AM

## 2022-07-18 NOTE — Progress Notes (Signed)
Physical Therapy Session Note  Patient Details  Name: Briana Sweeney MRN: [id number] Date of Birth: [birth year]  Today's Date: 07/18/2022 PT Individual Time: 1100-1110 PT Individual Time Calculation (min): 10 min  and Today's Date: 07/18/2022 PT Group Time: 1125-1200 PT Group Time Calculation (min): 35 min  Short Term Goals: Week 2:  PT Short Term Goal 1 (Week 2): STG=LTG due to extended length of stay  Skilled Therapeutic Interventions/Progress Updates:      Therapy Documentation Precautions:  Precautions Precautions: Fall Precaution Comments: Reviewed verbally Restrictions Weight Bearing Restrictions: Yes RLE Weight Bearing: Non weight bearing  Individual Treatment Session:  Pt received seated in w/c in dayroom and agreeable to PT session. Pt reports unrated right residual limb pain and was pre-medicated. PT repositioned and appropriately donned limb guard and pt reported improvement in pain. Pt requested need to toilet and nurse present for handoff.   Group Treatment Session:  Nurse present for handoff of patient to PT and pt engaged in group therapy session. PT provided education regarding purpose and role of shrinker's and limb guard and how to prepare for prosthesis. Pt educated regarding desensitization techniques for residual limb pain and pt performed with various textured materials. Pt requires supervision for safety with sit to stand for dynamic standing balance with no UE support while engaging in unilateral tossing of bean bag for target. Pt returned to room by w/c and left seated at bedside with all needs in reach.   Therapy/Group: Individual Therapy  Verl Dicker Verl Dicker PT, DPT  07/18/2022, 7:55 AM

## 2022-07-18 NOTE — Progress Notes (Signed)
Occupational Therapy Session Note  Patient Details  Name: Briana Sweeney MRN: [id number] Date of Birth: 08/10/1971  Today's Date: 07/18/2022 OT Individual Time: 9735-3299 OT Individual Time Calculation (min): 74 min    Short Term Goals: Week 2:  OT Short Term Goal 1 (Week 2): Pt will perform shower seated with grab br support for breif stanidng with mod I, AE/DME OT Short Term Goal 2 (Week 2): Pt will stand for counter level task in kitchen for up to 2-3 minutes with support and mod I  Skilled Therapeutic Interventions/Progress Updates:   Pt eager for shower re-training session with report of less drainage from R residual limb and new shrinkers delivered and clean one in place. OT trng for covering area with waterproofing as well as PICC line. Pt needed to use toilet and was S for set up of w/c into bathroom with grab bar use and S with min cues for on and off via modified SPT. Pt indep for peri and buttocks hygiene post voiding and BM ( see Flowsheets for data). Pt transferred on and off shower seat with back with set up only and completed full bathing with 2 intermittent squats for peri and buttocks bathing using grab bar. Hair washing and drying indep sink side. {Pt requested donning hospital gown and leaving underwear off d/t redness between inner thighs. Pt able to apply barrier cream indep and verbalize skin protection of R residual limb and peri region. OT education on falls prevention in home, set up of self care and items to simplify tasks. Pt able to generate multiple strategies in preparation for safe discharge. Left pt bed level with bed alarm active, needs and call button in place.    Therapy Documentation Precautions:  Precautions Precautions: Fall Contact, NWB R LE  Precaution Comments: Reviewed verbally Restrictions Weight Bearing Restrictions: Yes RLE Weight Bearing: Non weight bearing    Therapy/Group: Individual Therapy  Barnabas Lister 07/18/2022, 7:45 AM

## 2022-07-19 ENCOUNTER — Other Ambulatory Visit (HOSPITAL_COMMUNITY): Payer: Self-pay

## 2022-07-19 DIAGNOSIS — Z89511 Acquired absence of right leg below knee: Secondary | ICD-10-CM | POA: Diagnosis not present

## 2022-07-19 LAB — CBC WITH DIFFERENTIAL/PLATELET
Abs Immature Granulocytes: 0.01 10*3/uL (ref 0.00–0.07)
Basophils Absolute: 0 10*3/uL (ref 0.0–0.1)
Basophils Relative: 0 %
Eosinophils Absolute: 0.3 10*3/uL (ref 0.0–0.5)
Eosinophils Relative: 10 %
HCT: 37 % (ref 36.0–46.0)
Hemoglobin: 11.7 g/dL — ABNORMAL LOW (ref 12.0–15.0)
Immature Granulocytes: 0 %
Lymphocytes Relative: 33 %
Lymphs Abs: 1.1 10*3/uL (ref 0.7–4.0)
MCH: 29.1 pg (ref 26.0–34.0)
MCHC: 31.6 g/dL (ref 30.0–36.0)
MCV: 92 fL (ref 80.0–100.0)
Monocytes Absolute: 0.5 10*3/uL (ref 0.1–1.0)
Monocytes Relative: 15 %
Neutro Abs: 1.4 10*3/uL — ABNORMAL LOW (ref 1.7–7.7)
Neutrophils Relative %: 42 %
Platelets: 64 10*3/uL — ABNORMAL LOW (ref 150–400)
RBC: 4.02 MIL/uL (ref 3.87–5.11)
RDW: 22.6 % — ABNORMAL HIGH (ref 11.5–15.5)
WBC: 3.4 10*3/uL — ABNORMAL LOW (ref 4.0–10.5)
nRBC: 0 % (ref 0.0–0.2)

## 2022-07-19 MED ORDER — DULOXETINE HCL 30 MG PO CPEP
60.0000 mg | ORAL_CAPSULE | Freq: Every day | ORAL | Status: DC
  Administered 2022-07-19 – 2022-07-21 (×3): 60 mg via ORAL
  Filled 2022-07-19 (×3): qty 2

## 2022-07-19 NOTE — Plan of Care (Signed)
  Problem: RH Balance Goal: LTG: Patient will maintain dynamic sitting balance (OT) Description: LTG:  Patient will maintain dynamic sitting balance with assistance during activities of daily living (OT) Outcome: Completed/Met   Problem: RH Grooming Goal: LTG Patient will perform grooming w/assist,cues/equip (OT) Description: LTG: Patient will perform grooming with assist, with/without cues using equipment (OT) Outcome: Completed/Met   Problem: RH Bathing Goal: LTG Patient will bathe all body parts with assist levels (OT) Description: LTG: Patient will bathe all body parts with assist levels (OT) Outcome: Completed/Met   Problem: RH Dressing Goal: LTG Patient will perform upper body dressing (OT) Description: LTG Patient will perform upper body dressing with assist, with/without cues (OT). Outcome: Completed/Met Goal: LTG Patient will perform lower body dressing w/assist (OT) Description: LTG: Patient will perform lower body dressing with assist, with/without cues in positioning using equipment (OT) Outcome: Completed/Met   Problem: RH Toileting Goal: LTG Patient will perform toileting task (3/3 steps) with assistance level (OT) Description: LTG: Patient will perform toileting task (3/3 steps) with assistance level (OT)  Outcome: Completed/Met   Problem: RH Simple Meal Prep Goal: LTG Patient will perform simple meal prep w/assist (OT) Description: LTG: Patient will perform simple meal prep with assistance, with/without cues (OT). Outcome: Completed/Met   Problem: RH Laundry Goal: LTG Patient will perform laundry w/assist, cues (OT) Description: LTG: Patient will perform laundry with assistance, with/without cues (OT). Outcome: Completed/Met   Problem: RH Toilet Transfers Goal: LTG Patient will perform toilet transfers w/assist (OT) Description: LTG: Patient will perform toilet transfers with assist, with/without cues using equipment (OT) Outcome: Completed/Met   Problem: RH  Tub/Shower Transfers Goal: LTG Patient will perform tub/shower transfers w/assist (OT) Description: LTG: Patient will perform tub/shower transfers with assist, with/without cues using equipment (OT) Outcome: Completed/Met

## 2022-07-19 NOTE — Progress Notes (Addendum)
Physical Therapy Session Note  Patient Details  Name: Briana Sweeney MRN: [id number] Date of Birth: Jan 25, 1971  Today's Date: 07/19/2022 PT Individual Time: 1016-1130 PT Individual Time Calculation (min): 74 min   Short Term Goals: Week 2:  PT Short Term Goal 1 (Week 2): STG=LTG due to extended length of stay  Skilled Therapeutic Interventions/Progress Updates: Pt presents sitting in w/c and agreeable to therapy.  Pt states "not much" pain to R limb but quantifies as 7/10.  Pt wheels out of room and down hallway w/ supervision, occasional cue for increased shoulder extension for improved propulsion.   Pt performed multiple squat pivot transfers, w/c<>mat table, Nu-step and bed w/ supervision.  Pt performed standing clothespin task using B UE s w/ CGA x 5' w/o c/o.  Pt amb x 20' w/ RW and CGA  although verbal cues for improved foot clearance, especially w/ turning to approach w/c.  Pt requests use of BR.  Pt wheeled to bottom of bed and then amb x 8' to toilet.  Pt sup for clothing doff and then pericare, but min A for sit to stand from low toilet and clothing don/pull-up.  NT to chart on continent void in toilet.  Pt wheeled to sink and performed standing handwashing.  Pt requires frequent cueing for brake application before mobility.  Pt performed Nu-step x 5' at Level 3 for UE s and L LE.  Pt returned to room and transferred w/c > bed w/ supervision and then to supine.  Bed alarm on and all needs in reach.     Therapy Documentation Precautions:  Precautions Precautions: Fall Precaution Comments: Reviewed verbally Restrictions Weight Bearing Restrictions: Yes RLE Weight Bearing: Non weight bearing General:   Vital Signs:   Pain:7/10      Therapy/Group: Individual Therapy  [name] 07/19/2022, 11:32 AM

## 2022-07-19 NOTE — Progress Notes (Signed)
Occupational Therapy Discharge Summary  Patient Details  Name: Briana Sweeney MRN: [id number] Date of Birth: April 30, 1971  Date of Discharge from OT service:{Time; dates multiple:304500300}  {CHL IP REHAB OT TIME CALCULATIONS:304400400}   Patient has met 12 of 12 long term goals due to improved activity tolerance, improved balance, postural control, ability to compensate for deficits, improved attention, and improved awareness.  Patient to discharge at overall Modified Independent level for BADL's and transfers, S for higher level mobility. Patient's care partner (preacher/preacher's wife and next door neighbor) is independent to provide the necessary physical and cognitive assistance at discharge.    Reasons goals not met: n/a  Recommendation:  Patient will benefit from ongoing skilled OT services in home health setting to continue to advance functional skills in the area of BADL, iADL, and Reduce care partner burden.  Equipment: W/c, cushion, calf pad, RW, 3in1 commode, tub transfer bench  Reasons for discharge: treatment goals met  Patient/family agrees with progress made and goals achieved: Yes  OT Discharge Precautions/Restrictions    General   Vital Signs Therapy Vitals Temp: 97.9 F (36.6 C) Temp Source: Oral Pulse Rate: 84 Resp: 17 BP: 98/66 Patient Position (if appropriate): Lying Oxygen Therapy SpO2: 94 % O2 Device: Room Air Pain Pain Assessment Pain Scale: 0-10 Pain Score: 9  Pain Type: Acute pain Pain Location: Leg Pain Orientation: Right Pain Descriptors / Indicators: Aching Pain Frequency: Constant Pain Onset: On-going Pain Intervention(s): Medication (See eMAR) ADL ADL Grooming: Setup Where Assessed-Grooming: Sitting at sink Upper Body Bathing: Supervision/safety Where Assessed-Upper Body Bathing: Shower Lower Body Bathing: Contact guard Where Assessed-Lower Body Bathing: Shower Upper Body Dressing: Supervision/safety Where Assessed-Upper  Body Dressing: Edge of bed Lower Body Dressing: Minimal assistance (to doff/donn TED hose) Where Assessed-Lower Body Dressing: Bed level, Other (Comment), Edge of bed (toilet) Toileting: Contact guard Where Assessed-Toileting: Glass blower/designer: Therapist, music Method: Arts development officer: Energy manager Method: Radiographer, therapeutic: Civil engineer, contracting with back Nurse, children's    Mobility     Trunk/Postural Assessment     Balance   Extremity/Trunk Assessment       Barnabas Lister 07/19/2022, 7:40 AM

## 2022-07-19 NOTE — Progress Notes (Signed)
PROGRESS NOTE   Subjective/Complaints:  Pt reports pain in R BKA is bad, but not as bad as it had been- wondering if it was duloxetine that helped? Kept limb guard on to sleep- felt safer.  No more drainage.    ROS:  Pt denies SOB, abd pain, CP, N/V/C/D, and vision changes  Except for HPI     Objective:   No results found. Recent Labs    07/17/22 0255 07/19/22 0429  WBC 4.8 3.4*  HGB 10.8* 11.7*  HCT 32.8* 37.0  PLT 76* 64*    Recent Labs    07/17/22 0255  NA 133*  K 4.1  CL 99  CO2 23  GLUCOSE 108*  BUN 9  CREATININE 0.50  CALCIUM 8.2*     Intake/Output Summary (Last 24 hours) at 07/19/2022 0818 Last data filed at 07/19/2022 0753 Gross per 24 hour  Intake 588 ml  Output --  Net 588 ml        Physical Exam: Vital Signs Blood pressure 98/66, pulse 84, temperature 97.9 F (36.6 C), temperature source Oral, resp. rate 17, height '4\' 11"'$  (1.499 m), weight 63.2 kg, SpO2 94 %.         General: awake, alert, appropriate, sitting up in bed; NAD HENT: conjugate gaze; oropharynx moist CV: regular rate; no JVD Pulmonary: CTA B/L; no W/R/R- good air movement GI: soft, NT, ND, (+)BS- not swollen Psychiatric: appropriate- flat Neurological: alert- able to share more info today MS: R BKA no more drainage- has shrinker in place-  Extremities: No clubbing, cyanosis Skin: incision CDI with staples, minimal s/s discharge--black shrinker Neurologic: Cranial nerves II through XII intact, motor strength is 5/5 in bilateral deltoid, bicep, tricep, grip, B hip flexor,left  knee extensors, ankle dorsiflexor and plantar flexor  Musculoskeletal: Full range of motion in all 4 extremities. No joint swelling    Above 11/7  Today 11/9    Assessment/Plan: 1. Functional deficits which require 3+ hours per day of interdisciplinary therapy in a comprehensive inpatient rehab setting. Physiatrist is providing  close team supervision and 24 hour management of active medical problems listed below. Physiatrist and rehab team continue to assess barriers to discharge/monitor patient progress toward functional and medical goals  Care Tool:  Bathing    Body parts bathed by patient: Right arm, Left arm, Chest, Abdomen, Front perineal area, Right upper leg, Left upper leg, Left lower leg, Face, Buttocks   Body parts bathed by helper: Buttocks Body parts n/a: Right lower leg   Bathing assist Assist Level: Set up assist     Upper Body Dressing/Undressing Upper body dressing   What is the patient wearing?: Pull over shirt, Bra    Upper body assist Assist Level: Independent    Lower Body Dressing/Undressing Lower body dressing      What is the patient wearing?: Underwear/pull up, Pants     Lower body assist Assist for lower body dressing: Set up assist     Toileting Toileting    Toileting assist Assist for toileting: Independent with assistive device Assistive Device Comment: walker, bsc   Transfers Chair/bed transfer  Transfers assist     Chair/bed transfer assist level: Contact  Guard/Touching assist     Locomotion Ambulation   Ambulation assist   Ambulation activity did not occur: Safety/medical concerns (2/2 wound vac on R BKA)          Walk 10 feet activity   Assist  Walk 10 feet activity did not occur: Safety/medical concerns (2/2 wound vac on R BKA)        Walk 50 feet activity   Assist Walk 50 feet with 2 turns activity did not occur: Safety/medical concerns (2/2 wound vac on R BKA)         Walk 150 feet activity   Assist Walk 150 feet activity did not occur: Safety/medical concerns (2/2 wound vac on R BKA)         Walk 10 feet on uneven surface  activity   Assist Walk 10 feet on uneven surfaces activity did not occur: Safety/medical concerns (2/2 wound vac on R BKA)         Wheelchair     Assist Is the patient using a  wheelchair?: Yes Type of Wheelchair: Manual    Wheelchair assist level: Minimal Assistance - Patient > 75% Max wheelchair distance: 175 feet (per PT report)    Wheelchair 50 feet with 2 turns activity    Assist        Assist Level: Minimal Assistance - Patient > 75% (per PT report, pt completed 175 feet Min A)   Wheelchair 150 feet activity     Assist      Assist Level: Minimal Assistance - Patient > 75% (Per PT report, pt completed 175 feet Min A)   Blood pressure 98/66, pulse 84, temperature 97.9 F (36.6 C), temperature source Oral, resp. rate 17, height '4\' 11"'$  (1.499 m), weight 63.2 kg, SpO2 94 %.  Medical Problem List and Plan: 1. Functional deficits secondary to right BKA             -patient may shower with tubing clamped, vac disconnected. Do not submerge.              -ELOS/Goals: 10-14 days             - Lives alone in handicapped accessible 1 floor apartment    D/c date 11/13  Con't CIR- PT and OT and SLP- missed SLP yesterday due to fatigue 2.  Antithrombotics: -DVT/anticoagulation:  Pharmaceutical: Lovenox             -antiplatelet therapy: none             -LUE duplex ordered on admission for edema   10/31- will order LLE Dopplers to rule out DVT since has abrupt onset/worsening LLE edema  11/1- Dopplers (-)- done today- due to continuing bleeding/hemorrhoids and hx fof frequent plt transfusions, will stop lovenox per pt request 3. Pain Management: Tylenol, Robaxin, Dilaudid 4 mg q 4 hours as needed   10/30- will add gabapentin 300 mg BID- goal to get off pain meds by d/c that are controlled.   10/31- still having pain, but explained that phantom pain doesn't respond to opiates- it responds to nerve pain meds- said has tolerated gabapentin in past  11/1- increased gabapentin to 300 mg QID  11/3- increased gabapentin to 600 mg TID- will look at weaning dilaudid next week  11/4-5 encouraged pt to massage, rub stump. Continue meds as above  11/6- will  reduce Dilaudid to q6 hours prn  11/7- educated pt will con't to wean Dilaudid and will not go home on it.  11/9- decrease Dilaudid to q8 hours prn- with goal of stopping by d/c.   11/10- need to decrease dilaudid to q12 hours on Saturday 4. Mood/Behavior/Sleep: LCSW to evaluate and provide emotional support             -h/o bipolar>>continue Abilify, Prozac             -h/o anxiety>>continue clonazepam 0.5 mg BID as needed             -antipsychotic agents: n/a   5. Neuropsych/cognition: This patient is capable of making decisions on her own behalf.             -SLP eval for difficulty with higher cognitive tasks   11/1- has higher level cognitive issues- will need assist for money/med mgmt. [facility] [place] 6. Skin/Wound Care: Routine skin care checks             -  maintain negative pressure dressing dressing through next Tuesday 10/31             - Wound vac per ortho             10/31- will write for [facility] removal  11/1- removed- has two new small sacral spots  11/4- wound looks ok. Can use simply silver infused shrinker per Dr. Sharol Given protocol.   11/7- a little more red? Not sure if due to pressure of limb guard? Will recheck CBC in AM- already on IV ABX.   11/8- WBC 4.8- and less erythema on exam 11/9- A little trace red drainage from incision, however less erythema- likely due to wearing shrinker?  11/10- no more drainage- con't regimen- got new shrinkers 7. 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 Juleen China: At discharge from rehab, DC PICC after 1x dose of oritavancin; then transition to oral linezolid to complete course at 87/56   9: Alcoholic cirrhosis/ascites/elevated LFTs/esophageal varices:             -continue Lasix 40  mg daily             -continue lactulose 20 grams BID (goal 2-3 bowel movements daily)             -continue nadolol 20 mg daily             -continue aldactone 100 mg daily             -continue Folvite, thiamine supplementation             -Gets OP Qmonthly ascites taps; will need to arrange with IR while admitted   10/30- having 4-7 BM's day will need ot check with next paracentesis is needed  11/1- will get paracentesis today- got out 800cc  11/3- looks better  11/5 discussed with pt need for lactulose given liver disease  11/7- intermittently to more frequent refusal of lactulose- educated cannot refuse lactulose. Also refused hemorrhoid meds.   11/8- pt says having 4-7 Bms/day- educated ot not miss lactulose 10: Seizure disorder: continue Keppra   11. History polysubstance abuse            -  Hx alcohol, THC - none current per patient            - Plan to DC picc prior to discharge as above   12. AKI on CKD stage 3. Admission labs. Baseline Cr Appears 0.4-0.7.       Latest Ref Rng & Units 07/17/2022    2:55 AM 07/15/2022    3:39 AM 07/11/2022    4:05 AM  BMP  Glucose 70 - 99 mg/dL 108  92  102   BUN 6 - 20 mg/dL '9  7  10   '$ Creatinine 0.44 - 1.00 mg/dL 0.50  0.59  0.50   Sodium 135 - 145 mmol/L 133  128  133   Potassium 3.5 - 5.1 mmol/L 4.1  4.0  3.9   Chloride 98 - 111 mmol/L 99  96  99   CO2 22 - 32 mmol/L '23  25  25   '$ Calcium 8.9 - 10.3 mg/dL 8.2  7.6  8.0     AKI resolved   13.  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  11/9- will recheck in AM- off lovenox 14. 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   11/1- up to 300 mg QID  11/3- increase to 600 mg TID  11/4 see pain discussion above in #3  11/8- will add Duloxetine 30 mg nightly-   11/9- risk benefit ratio of Duloxetine with low plts- and nerve pain that cannot use opiates due to polysubstance abuse after d/c.   11/10- will increase Duloxetine to 60 mg QHS- appears that pt's nerve pain a little better since added it- don't want to increase Gabapentin due to sedation.  15. Hypokalemia  10/30- will replete 40 mEq x1 and recheck later in week  11/2- K+  3.9 16. LLE edema  10/31- will get Dopplers of LLE since has edema and has missed multiple doses of lovenox- due to refusal- also ordered TEDs for LLE  11/1- no DVT 17. Cough  10/31- ordered Tessalon pearles TID prn for cough- already has robitussin prn. 11/9- resolved  18. Anal bleeding/hemorrhoids (pt said she 11/1 - started anusol suppository, cream and witch hazel pads.  11/2- no bleeding in last 12+ hours-   11/9- hemorrhoids- no bleeding lately- has been refusing Anusol- will make prn cream, but d/c Supp.  18. Hyponatremia  11/6- Na down to 128- is unusual for her- will recheck Wednesday and monitor  11/7- labs in AM  11/8- Na up to 133-  19. Epistaxis  11/9- stopped spontaneously- likely due ot low plts and dry nose- will order Afrin prn    PT needs Dilaudid decreased to q12 hours prn on Saturday this weekend- please  LOS: 14 days A FACE TO FACE EVALUATION WAS PERFORMED  Ernan Runkles 07/19/2022, 8:18 AM

## 2022-07-19 NOTE — Progress Notes (Signed)
Occupational Therapy Session Note  Patient Details  Name: Briana Sweeney MRN: [id number] Date of Birth: 1971/07/16  Today's Date: 07/19/2022 OT Individual Time: 0900-1000 1st Session, 1415-1530 2nd Session  OT Individual Time Calculation (min): 60 min, 75 min    Short Term Goals: Week 2:  OT Short Term Goal 1 (Week 2): Pt will perform shower seated with grab br support for breif stanidng with mod I, AE/DME OT Short Term Goal 2 (Week 2): Pt will stand for counter level task in kitchen for up to 2-3 minutes with support and mod I  Skilled Therapeutic Interventions/Progress Updates:  1st Session:  Pt up and ready at sink side completing oral care upon OT arrival. OT requested pt to stand with counter support for the completion and was able to stand 4 min x 3 sets for oral and hair care with counter support and mod I. Pt requires increased time for processing steps of transfers, w/c management and leg rests but with written info and time pt able to manage with mod I. Pt self propelled out of room to and from demo apt kitchen space for training as well as to tub room. Pt was able to retrieve items in standing with RW from pantry closet and place on rolling cart next to walker. OT had pt add up prices of items with cell phone calculator and required 2 attempts for accuracy. Pt then able to simulate frozen meal retrieval from freezer to microwave including item bridging along counter top with mod I w/c level. Pt then self propelled to tub room and transferred off and on tub transfer bench with mod I with increased processing time for safe w/c set up. Left pt up in w/c at end of session with chair alarm set, needs and call button in reach.   2nd Session:  Pt awaiting OT for session with focus on community re-entry skills, standing balance and tolerance and discharge safety. OT reassessed pt's education and S/LTG achievement with plan for final session focussed on IADL's and balance with RW/counter.  Pt self propelled to 1st floor using elevator x 2 with some cues and support due to timing. Self propelled through gidt shop spaces. Stood x 6 trials with RW with mod I and completed purchase w/c and standing level. Once back on unit, OT continued safety with transfer education using written instructions with mod I transfer w/c to bed. Left pt in bed with alarm set, needs and call button in reach. No pain reported.    Therapy Documentation Precautions:  Precautions Precautions: Fall Precaution Comments: Reviewed verbally Restrictions Weight Bearing Restrictions: Yes RLE Weight Bearing: Non weight bearing    Therapy/Group: Individual Therapy  Barnabas Lister 07/19/2022, 7:41 AM

## 2022-07-19 NOTE — Progress Notes (Signed)
Recreational Therapy Discharge Summary Patient Details  Name: Briana Sweeney MRN: [id number] Date of Birth: [birth year] Today's Date: 07/19/2022   Comments on progress toward goals: Pt has made good progress during LOS and set for discharge home 11/13.  TR sessions focused on amputee support, activity tolerance, dynamic standing.  Pt required close supervision for standing during mod complex tasks.  Pt is set up/supervision for seated tasks w/c level.  Reasons for discharge: discharge from hospital  Follow-up: [place] agrees with progress made and goals achieved: Yes  Briana Sweeney 07/19/2022, 12:21 PM

## 2022-07-20 DIAGNOSIS — Z89511 Acquired absence of right leg below knee: Secondary | ICD-10-CM | POA: Diagnosis not present

## 2022-07-20 MED ORDER — HYDROMORPHONE HCL 2 MG PO TABS
4.0000 mg | ORAL_TABLET | Freq: Two times a day (BID) | ORAL | Status: DC | PRN
  Administered 2022-07-20 – 2022-07-22 (×4): 4 mg via ORAL
  Filled 2022-07-20 (×5): qty 2

## 2022-07-20 NOTE — Progress Notes (Addendum)
Speech Language Pathology Discharge Summary  Patient Details  Name: Briana Sweeney MRN: [id number] Date of Birth: [birth year]  Date of Discharge from SLP service:July 20, 2022  Today's Date: 07/20/2022 SLP Individual Time: 0920-0955 SLP Individual Time Calculation (min): 35 min   Skilled Therapeutic Interventions:   Pt seen for skilled ST intervention targeting cognitive goals outlined in care plan. SLP facilitated today's session by [place] to assess pt's cognitive function after receiving intensive intervention in IPR setting. This date, pt received a score of 14 out of 30 (n = 27), which is 2 points higher than score received on initial evaluation (12/30). Nevertheless, this score is indicative of at least moderate to severe cognitive impairments, which were likely present prior to admission; however, appeared exacerbated in the setting of debility and fatigue.   SLP provided extensive, verbal education to pt re: recommendations for friends and/or family to assist with more complex iADL tasks to include driving, finances, and medication management, with pt stating her pastor may be able to help occasionally, particularly with driving pt to the store. Reviewed safe meal preparation and use of ready-made meals, simple prep, and/or use of microwave for heating foods up vs utilizing stove and oven at this time. Provided verbal list of high protein foods that are easy to prepare without  use of stove/microwave. States she has a neighbor that will sometimes bring her meals. Reinforced recommendation to not drive, with pt reporting she will either take the bus or have her pastor come and help with driving. With medications, prompted pt to call her pharmacy to see if they provide medications that are pre-filled in pill box or bags to ease cognitive load that this task entails. With Sup to Min A verbal cues, pt attempted to call pharmacy to get more details on this; however, they  were closed. Re: financial management, pt states that all of her bills are set up on auto draft.   Pt verbalized understanding of education following delay with distractions via teach back with Sup A question prompts. Pt missed 5 minutes of therapy due to MD rounding. All questions answered to pt's satisfaction. Pt left in bed with MD present.  Patient has met 4 of 4 long term goals.  Patient to discharge at overall Mod;Max level.  Reasons goals not met: N/A   Clinical Impression/Discharge Summary:    During IPR admission, pt demonstrated slow, subtle functional recovery, as it relates to cognition, as evident by meeting 4 out of 4 low-level, long-term goals. Nevertheless, pt continues to present with significant cognitive impairments in all domains; therefore, would benefit from 24/7 supervision and assistance. Given pt has limited to no caregiver support, recommend at least intermittent supervision and assistance, for completion of iADL tasks, at time of discharge. Additionally, pt would benefit from ongoing ST intervention via home health. Pt educated on recommendations and verbalized understanding.  Recommendation:  24 hour supervision/assistance;Outpatient SLP;Home Health SLP  Rationale for SLP Follow Up: Maximize cognitive function and independence;Reduce caregiver burden   Equipment: N/A   Reasons for discharge: Discharged from hospital   Patient/Family Agrees with Progress Made and Goals Achieved: Yes (Patient only; no family nor caregivers have been present for education. Pt with limited supports)    Gerod Caligiuri A Donavin Audino 07/20/2022, 11:28 AM

## 2022-07-20 NOTE — Plan of Care (Signed)
  Problem: RH Problem Solving ?Goal: LTG Patient will demonstrate problem solving for (SLP) ?Description: LTG:  Patient will demonstrate problem solving for basic/complex daily situations with cues  (SLP) ?Outcome: Completed/Met ?  ?Problem: RH Memory ?Goal: LTG Patient will use memory compensatory aids to (SLP) ?Description: LTG:  Patient will use memory compensatory aids to recall biographical/new, daily complex information with cues (SLP) ?Outcome: Completed/Met ?  ?Problem: RH Attention ?Goal: LTG Patient will demonstrate this level of attention during functional activites (SLP) ?Description: LTG:  Patient will will demonstrate this level of attention during functional activites (SLP) ?Outcome: Completed/Met ?  ?Problem: RH Awareness ?Goal: LTG: Patient will demonstrate awareness during functional activites type of (SLP) ?Description: LTG: Patient will demonstrate awareness during functional activites type of (SLP) ?Outcome: Completed/Met ?  ?

## 2022-07-20 NOTE — Progress Notes (Signed)
PROGRESS NOTE   Subjective/Complaints:    Pt wishes to keep limb guard on to reduce pain , pt has no sig pain today, taking hydromorphone 1-2 x per day (available q8h prn)  ROS:  Pt denies SOB, abd pain, CP, N/V/C/D, and vision changes  Except for HPI     Objective:   No results found. Recent Labs    07/19/22 0429  WBC 3.4*  HGB 11.7*  HCT 37.0  PLT 64*     No results for input(s): "NA", "K", "CL", "CO2", "GLUCOSE", "BUN", "CREATININE", "CALCIUM" in the last 72 hours.    Intake/Output Summary (Last 24 hours) at 07/20/2022 0950 Last data filed at 07/20/2022 0735 Gross per 24 hour  Intake 837 ml  Output 150 ml  Net 687 ml         Physical Exam: Vital Signs Blood pressure 94/63, pulse 84, temperature 97.8 F (36.6 C), resp. rate 16, height '4\' 11"'$  (1.499 m), weight 63.7 kg, SpO2 94 %.   General: No acute distress Mood and affect are appropriate Heart: Regular rate and rhythm no rubs murmurs or extra sounds Lungs: Clear to auscultation, breathing unlabored, no rales or wheezes Abdomen: Positive bowel sounds, soft nontender to palpation, nondistended Extremities: No clubbing, cyanosis, or edema   MS: R BKA  has shrinker and limb guard Extremities: No clubbing, cyanosis  Neurologic: Cranial nerves II through XII intact, motor strength is 5/5 in bilateral deltoid, bicep, tricep, grip, B hip flexor,left  knee extensors, ankle dorsiflexor and plantar flexor  Musculoskeletal: Full range of motion in all 4 extremities. No joint swelling    Above 11/7   11/9    Assessment/Plan: 1. Functional deficits which require 3+ hours per day of interdisciplinary therapy in a comprehensive inpatient rehab setting. Physiatrist is providing close team supervision and 24 hour management of active medical problems listed below. Physiatrist and rehab team continue to assess barriers to discharge/monitor patient  progress toward functional and medical goals  Care Tool:  Bathing    Body parts bathed by patient: Right arm, Left arm, Chest, Abdomen, Front perineal area, Right upper leg, Left upper leg, Left lower leg, Face, Buttocks   Body parts bathed by helper: Buttocks Body parts n/a: Right lower leg   Bathing assist Assist Level: Independent with assistive device     Upper Body Dressing/Undressing Upper body dressing   What is the patient wearing?: Pull over shirt, Bra    Upper body assist Assist Level: Independent    Lower Body Dressing/Undressing Lower body dressing      What is the patient wearing?: Underwear/pull up, Pants     Lower body assist Assist for lower body dressing: Independent with assitive device     Toileting Toileting    Toileting assist Assist for toileting: Independent with assistive device Assistive Device Comment: walker, bsc   Transfers Chair/bed transfer  Transfers assist     Chair/bed transfer assist level: Supervision/Verbal cueing     Locomotion Ambulation   Ambulation assist   Ambulation activity did not occur: Safety/medical concerns (2/2 wound vac on R BKA)  Assist level: Contact Guard/Touching assist Assistive device: Walker-rolling Max distance: 20   Walk 10  feet activity   Assist  Walk 10 feet activity did not occur: Safety/medical concerns (2/2 wound vac on R BKA)  Assist level: Contact Guard/Touching assist Assistive device: Walker-rolling   Walk 50 feet activity   Assist Walk 50 feet with 2 turns activity did not occur: Safety/medical concerns (2/2 wound vac on R BKA)         Walk 150 feet activity   Assist Walk 150 feet activity did not occur: Safety/medical concerns (2/2 wound vac on R BKA)         Walk 10 feet on uneven surface  activity   Assist Walk 10 feet on uneven surfaces activity did not occur: Safety/medical concerns (2/2 wound vac on R BKA)         Wheelchair     Assist Is the  patient using a wheelchair?: Yes Type of Wheelchair: Manual    Wheelchair assist level: Supervision/Verbal cueing Max wheelchair distance: 150    Wheelchair 50 feet with 2 turns activity    Assist        Assist Level: Supervision/Verbal cueing   Wheelchair 150 feet activity     Assist      Assist Level: Supervision/Verbal cueing   Blood pressure 94/63, pulse 84, temperature 97.8 F (36.6 C), resp. rate 16, height '4\' 11"'$  (1.499 m), weight 63.7 kg, SpO2 94 %.  Medical Problem List and Plan: 1. Functional deficits secondary to right BKA             -patient may shower with tubing clamped, vac disconnected. Do not submerge.              -ELOS/Goals: 10-14 days             - Lives alone in handicapped accessible 1 floor apartment    D/c date 11/13  Con't CIR- PT and OT and SLP- missed SLP yesterday due to fatigue 2.  Antithrombotics: -DVT/anticoagulation:  Pharmaceutical: Lovenox             -antiplatelet therapy: none             -LUE duplex ordered on admission for edema   10/31- will order LLE Dopplers to rule out DVT since has abrupt onset/worsening LLE edema  11/1- Dopplers (-)- done today- due to continuing bleeding/hemorrhoids and hx fof frequent plt transfusions, will stop lovenox per pt request 3. Pain Management: Tylenol, Robaxin, Dilaudid 4 mg BID prn as of 11/11   10/30- will add gabapentin 300 mg BID- goal to get off pain meds by d/c that are controlled.   10/31- still having pain, but explained that phantom pain doesn't respond to opiates- it responds to nerve pain meds- said has tolerated gabapentin in past  11/1- increased gabapentin to 300 mg QID  11/3- increased gabapentin to 600 mg TID- will look at weaning dilaudid next week  11/4-5 encouraged pt to massage, rub stump. Continue meds as above  11/6- will reduce Dilaudid to q6 hours prn  11/7- educated pt will con't to wean Dilaudid and will not go home on it.   11/9- decrease Dilaudid to q8 hours  prn- with goal of stopping by d/c.   11/10- need to decrease dilaudid to q12 hours on Saturday 4. Mood/Behavior/Sleep: LCSW to evaluate and provide emotional support             -h/o bipolar>>continue Abilify, Prozac             -h/o anxiety>>continue clonazepam 0.5 mg BID as needed             -  antipsychotic agents: n/a   5. Neuropsych/cognition: This patient is capable of making decisions on her own behalf.             -SLP eval for difficulty with higher cognitive tasks   11/1- has higher level cognitive issues- will need assist for money/med mgmt. [facility] [place] 6. Skin/Wound Care: Routine skin care checks             -  maintain negative pressure dressing dressing through next Tuesday 10/31             - Wound vac per ortho             10/31- will write for [facility] removal  11/1- removed- has two new small sacral spots  11/4- wound looks ok. Can use simply silver infused shrinker per Dr. Sharol Given protocol.   11/7- a little more red? Not sure if due to pressure of limb guard? Will recheck CBC in AM- already on IV ABX.   11/8- WBC 4.8- and less erythema on exam 11/9- A little trace red drainage from incision, however less erythema- likely due to wearing shrinker?  11/10- no more drainage- con't regimen- got new shrinkers 7. 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 Juleen China: At discharge from rehab, DC PICC after 1x dose of oritavancin; then transition to oral linezolid to complete course at 44/03   9: Alcoholic cirrhosis/ascites/elevated LFTs/esophageal varices:             -continue Lasix 40 mg daily             -continue lactulose 20 grams BID (goal 2-3 bowel movements daily)             -continue nadolol 20 mg daily              -continue aldactone 100 mg daily             -continue Folvite, thiamine supplementation             -Gets OP Qmonthly ascites taps; will need to arrange with IR while admitted   10/30- having 4-7 BM's day will need ot check with next paracentesis is needed  11/1- will get paracentesis today- got out 800cc  11/3- looks better  11/5 discussed with pt need for lactulose given liver disease  11/7- intermittently to more frequent refusal of lactulose- educated cannot refuse lactulose. Also refused hemorrhoid meds.   11/8- pt says having 4-7 Bms/day- educated ot not miss lactulose 10: Seizure disorder: continue Keppra   11. History polysubstance abuse            - Hx alcohol, THC - none current per patient            - Plan to DC picc prior to discharge as above   12. AKI on CKD stage 3. Admission labs. Baseline Cr Appears 0.4-0.7.       Latest Ref Rng & Units 07/17/2022    2:55 AM 07/15/2022    3:39 AM 07/11/2022    4:05 AM  BMP  Glucose 70 - 99 mg/dL 108  92  102   BUN 6 - 20 mg/dL '9  7  10   '$ Creatinine 0.44 - 1.00 mg/dL 0.50  0.59  0.50   Sodium 135 - 145 mmol/L 133  128  133   Potassium 3.5 - 5.1 mmol/L 4.1  4.0  3.9   Chloride 98 - 111 mmol/L 99  96  99   CO2 22 - 32 mmol/L '23  25  25   '$ Calcium 8.9 - 10.3 mg/dL 8.2  7.6  8.0     AKI resolved   13.  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  11/9- will recheck in AM- off lovenox 14. 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   11/1- up to 300 mg QID  11/3- increase to 600 mg TID  11/4 see pain discussion above in #3  11/8- will add Duloxetine 30 mg nightly-   11/9- risk benefit ratio of Duloxetine with low plts- and nerve pain that cannot use opiates due to polysubstance abuse after d/c.   11/10- will increase Duloxetine to 60 mg QHS- appears that pt's nerve pain a little better since added it- don't want to increase Gabapentin due to sedation.  15. Hypokalemia  10/30- will replete 40 mEq x1 and recheck later in week  11/2- K+  3.9 16. LLE edema  10/31- will get Dopplers of LLE since has edema and has missed multiple doses of lovenox- due to refusal- also ordered TEDs for LLE  11/1- no DVT 17. Cough  10/31- ordered Tessalon pearles TID prn for cough- already has robitussin prn. 11/9- resolved  18. Anal bleeding/hemorrhoids (pt said she 11/1 - started anusol suppository, cream and witch hazel pads.  11/2- no bleeding in last 12+ hours-   11/9- hemorrhoids- no bleeding lately- has been refusing Anusol- will make prn cream, but d/c Supp.  18. Hyponatremia  11/6- Na down to 128- is unusual for her- will recheck Wednesday and monitor  11/7- labs in AM  11/8- Na up to 133-  19. Epistaxis  Resolved       LOS: 15 days A FACE TO FACE EVALUATION WAS PERFORMED  Charlett Blake 07/20/2022, 9:50 AM

## 2022-07-21 NOTE — Progress Notes (Signed)
Physical Therapy Discharge Summary  Patient Details  Name: Briana Sweeney MRN: [id number] Date of Birth: [birth year]  Date of Discharge from PT service:July 21, 2022  Today's Date: 07/21/2022 PT Individual Time: 0916-1015 PT Individual Time Calculation (min): 59 min    Patient has met 3 of 7 long term goals due to improved activity tolerance, improved balance, improved postural control, increased strength, increased range of motion, and ability to compensate for deficits.  Patient to discharge at a wheelchair level Supervision.   Patient's caregiver present and demonstrated competency with supervisory assistance.   Reasons goals not met: Pt with memory and cognition impairments and continues to require verbal cues for AD management and sequencing with sit to stand, bed to chair, and car transfers.   Recommendation:  Patient will benefit from ongoing skilled PT services in home health setting to continue to advance safe functional mobility, address ongoing impairments in balance, coordination, strength, and minimize fall risk.  Equipment: RW, manual w/c and cushion  Reasons for discharge: treatment goals met and discharge from hospital  Patient/family agrees with progress made and goals achieved: Yes   PT Discharge  Pain Interference Pain Interference Pain Effect on Sleep: 0. Does not apply - I have not had any pain or hurting in the past 5 days Pain Interference with Therapy Activities: 0. Does not apply - I have not received rehabilitationtherapy in the past 5 days Pain Interference with Day-to-Day Activities: 4. Almost constantly Cognition Overall Cognitive Status: Impaired/Different from baseline Arousal/Alertness: Awake/alert Orientation Level: Oriented X4 Attention: Sustained Sustained Attention: Impaired Sustained Attention Impairment: Verbal basic;Functional basic Memory: Impaired Memory Impairment: Storage deficit;Decreased short term memory;Retrieval  deficit Decreased Short Term Memory: Verbal complex;Functional basic;Functional complex Awareness: Impaired Awareness Impairment: Emergent impairment Problem Solving: Impaired Problem Solving Impairment: Functional complex;Verbal complex;Functional basic Safety/Judgment: Impaired Sensation Sensation Light Touch: Appears Intact Hot/Cold: Appears Intact Proprioception: Appears Intact Stereognosis: Appears Intact Coordination Gross Motor Movements are Fluid and Coordinated: Yes Fine Motor Movements are Fluid and Coordinated: Yes Finger Nose Finger Test: [facility] Heel Shin Test: unable to perform 2/2 R BKA Motor  Motor Motor: Within Functional Limits Motor - Skilled Clinical Observations: improved strength, activity tolerance and balance s/p R BKA  Mobility Bed Mobility Bed Mobility: Rolling Right;Rolling Left;Supine to Sit;Sit to Supine Rolling Right: Independent Rolling Left: Independent Supine to Sit: Independent Sit to Supine: Independent Transfers Transfers: Sit to Stand;Stand to Sit;Stand Psychologist, prison and probation services Transfers;Lateral/Scoot Transfers Sit to Stand: Supervision/Verbal cueing Stand to Sit: Supervision/Verbal cueing Stand Pivot Transfers: Supervision/Verbal cueing Stand Pivot Transfer Details: Verbal cues for precautions/safety;Verbal cues for safe use of DME/AE Squat Pivot Transfers: Supervision/Verbal cueing Transfer (Assistive device): Rolling walker Locomotion  Gait Ambulation: Yes Gait Assistance: Supervision/Verbal cueing Gait Distance (Feet): 30 Feet Assistive device: Rolling walker Gait Gait: Yes Gait Pattern: Poor foot clearance - left Gait velocity: decreased Stairs / Additional Locomotion Stairs: No Wheelchair Mobility Wheelchair Mobility: Yes Wheelchair Assistance: Independent with Camera operator: Both upper extremities Wheelchair Parts Management: Supervision/cueing Distance: 150 ft  Trunk/Postural Assessment   Cervical Assessment Cervical Assessment: Within Functional Limits Thoracic Assessment Thoracic Assessment: Within Functional Limits Lumbar Assessment Lumbar Assessment: Within Functional Limits Postural Control Postural Control: Deficits on evaluation Righting Reactions: delayed  Balance Balance Balance Assessed: Yes Dynamic Sitting Balance Dynamic Sitting - Balance Support: Feet supported Dynamic Sitting - Level of Assistance: 7: Independent Static Standing Balance Static Standing - Balance Support: Bilateral upper extremity supported Static Standing - Level of Assistance: 7: Independent Dynamic Standing Balance Dynamic  Standing - Balance Support: Bilateral upper extremity supported Dynamic Standing - Level of Assistance: 6: Modified independent (Device/Increase time) Dynamic Standing - Balance Activities: Lateral lean/weight shifting;Reaching for objects;Forward lean/weight shifting Extremity Assessment  RUE Assessment RUE Assessment: Within Functional Limits LUE Assessment LUE Assessment: Within Functional Limits RLE Assessment RLE Assessment: Exceptions to [facility] General Strength Comments: grossly 4+/5 LLE Assessment LLE Assessment: Exceptions to [facility] General Strength Comments: grossly 4+/5  Treatment Session 1:  Pt received seated in w/c at bedside, agreeable to PT session. Pt complaints of right residual pain and provided with flexibility exercises for relief. PT assessed pain interference, sensation, strength, coordination and balance to prepare for discharge. Pt mod I with w/c propulsion ~200 ft to main gym. Pt requires supervision and frequent cueing for set-up with squat pivot transfer to mat and is independent for sitting to lying. Pt provided with HEP and demonstrated following exercises with frequent cueing as pt presents with impaired short term memory and unable to recall instructions and form:   -side lying hip abduction 3 x 10   -supine hip flex 3 x 10   -supine  left single leg bridge 3 x 10   -supine hamstring stretch 3 x 10 seconds  Pt independent with supine to lying and supervision for mat to w/c transfer. Pt transported by w/c for time management to room and requests to toilet. Pt (S) with toilet transfer and requires verbal cues for sequencing. Pt performed stand pivot transfer with RW from [facility] to bed and left semi-reclined in bed with all needs in reach and bed alarm on.   Verl Dicker Verl Dicker PT, DPT  07/21/2022, 9:52 AM

## 2022-07-21 NOTE — Progress Notes (Signed)
Occupational Therapy Session Note  Patient Details  Name: Briana Sweeney MRN: [id number] Date of Birth: [birth year]  Today's Date: 07/21/2022 OT Individual Time: 1300-1340 OT Individual Time Calculation (min): 40 min    Short Term Goals: Week 2:  OT Short Term Goal 1 (Week 2): Pt will perform shower seated with grab br support for breif stanidng with mod I, AE/DME OT Short Term Goal 2 (Week 2): Pt will stand for counter level task in kitchen for up to 2-3 minutes with support and mod I  Skilled Therapeutic Interventions/Progress Updates:    Pt sitting EOB upon arrival with NT present. OT intervention with focus on bed mobility, doffing/donning shrinker, donning limb guard, ongoing discharge planning, and safety awareness. Pt independent with donning/doffing RLE shrinker and limb guard. Pt reported some drainage lateral aspect of residual limb. RN notified and attended to pt. Reviewed with pt what she would do at home if she noticed some continual drainage. Pt stated she would contact her MD if ongoing drainage noted. Pt also commented that she was "worried now" because of the drainage. I assured her that MD would take necessary precautions and advise her appropriately. Pt reamined in bed with all needs within reach.   Therapy Documentation Precautions:  Precautions Precautions: Fall Precaution Comments: Reviewed verbally Restrictions Weight Bearing Restrictions: Yes RLE Weight Bearing: Non weight bearing Pain:  Pt reports slight pain/discomfort posterior aspect of Rt upper leg (hamstring); repositioned   Therapy/Group: Individual Therapy  Leroy Libman 07/21/2022, 2:37 PM

## 2022-07-22 ENCOUNTER — Other Ambulatory Visit (HOSPITAL_COMMUNITY): Payer: Self-pay

## 2022-07-22 ENCOUNTER — Other Ambulatory Visit: Payer: Self-pay

## 2022-07-22 DIAGNOSIS — Z89511 Acquired absence of right leg below knee: Secondary | ICD-10-CM | POA: Diagnosis not present

## 2022-07-22 LAB — CBC
HCT: 36.2 % (ref 36.0–46.0)
Hemoglobin: 11.8 g/dL — ABNORMAL LOW (ref 12.0–15.0)
MCH: 29.6 pg (ref 26.0–34.0)
MCHC: 32.6 g/dL (ref 30.0–36.0)
MCV: 91 fL (ref 80.0–100.0)
Platelets: 49 10*3/uL — ABNORMAL LOW (ref 150–400)
RBC: 3.98 MIL/uL (ref 3.87–5.11)
RDW: 21.3 % — ABNORMAL HIGH (ref 11.5–15.5)
WBC: 3.3 10*3/uL — ABNORMAL LOW (ref 4.0–10.5)
nRBC: 0 % (ref 0.0–0.2)

## 2022-07-22 LAB — BASIC METABOLIC PANEL
Anion gap: 7 (ref 5–15)
BUN: 5 mg/dL — ABNORMAL LOW (ref 6–20)
CO2: 26 mmol/L (ref 22–32)
Calcium: 7.7 mg/dL — ABNORMAL LOW (ref 8.9–10.3)
Chloride: 97 mmol/L — ABNORMAL LOW (ref 98–111)
Creatinine, Ser: 0.52 mg/dL (ref 0.44–1.00)
GFR, Estimated: >60 mL/min (ref >60)
Glucose, Bld: 92 mg/dL (ref 70–99)
Potassium: 3.6 mmol/L (ref 3.5–5.1)
Sodium: 130 mmol/L — ABNORMAL LOW (ref 135–145)

## 2022-07-22 LAB — CK: Total CK: 28 U/L — ABNORMAL LOW (ref 38–234)

## 2022-07-22 MED ORDER — PANTOPRAZOLE SODIUM 40 MG PO TBEC: 40.0000 mg | DELAYED_RELEASE_TABLET | Freq: Every day | ORAL | 0 refills | Status: DC

## 2022-07-22 MED ORDER — DULOXETINE HCL 60 MG PO CPEP: 60.0000 mg | ORAL_CAPSULE | Freq: Every day | ORAL | 0 refills | Status: DC

## 2022-07-22 MED ORDER — SPIRONOLACTONE 100 MG PO TABS: 100.0000 mg | ORAL_TABLET | Freq: Every day | ORAL | 0 refills | Status: DC

## 2022-07-22 MED ORDER — HYDROMORPHONE HCL 4 MG PO TABS: 4.0000 mg | ORAL_TABLET | Freq: Two times a day (BID) | ORAL | 0 refills | Status: DC | PRN

## 2022-07-22 MED ORDER — FLUOXETINE HCL 20 MG PO CAPS
20.0000 mg | ORAL_CAPSULE | Freq: Every day | ORAL | 0 refills | Status: DC
  Filled 2022-07-22: qty 30, 30d supply, fill #0

## 2022-07-22 MED ORDER — ACETAMINOPHEN 325 MG PO TABS: 325.0000 mg | ORAL_TABLET | Freq: Four times a day (QID) | ORAL | Status: DC | PRN

## 2022-07-22 MED ORDER — HYDROMORPHONE HCL 4 MG PO TABS
4.0000 mg | ORAL_TABLET | Freq: Two times a day (BID) | ORAL | 0 refills | Status: DC | PRN
  Filled 2022-07-22: qty 14, 7d supply, fill #0

## 2022-07-22 MED ORDER — ADULT MULTIVITAMIN W/MINERALS CH: 1.0000 | ORAL_TABLET | Freq: Every day | ORAL | Status: DC

## 2022-07-22 MED ORDER — GABAPENTIN 300 MG PO CAPS: 600.0000 mg | ORAL_CAPSULE | Freq: Three times a day (TID) | ORAL | 0 refills | Status: DC

## 2022-07-22 MED ORDER — CLONAZEPAM 0.5 MG PO TABS: 0.5000 mg | ORAL_TABLET | Freq: Two times a day (BID) | ORAL | 0 refills | Status: DC | PRN

## 2022-07-22 MED ORDER — PANTOPRAZOLE SODIUM 40 MG PO TBEC
40.0000 mg | DELAYED_RELEASE_TABLET | Freq: Every day | ORAL | 0 refills | Status: DC
  Filled 2022-07-22: qty 30, 30d supply, fill #0

## 2022-07-22 MED ORDER — ARIPIPRAZOLE 10 MG PO TABS
10.0000 mg | ORAL_TABLET | Freq: Every day | ORAL | 0 refills | Status: DC
  Filled 2022-07-22: qty 30, 30d supply, fill #0

## 2022-07-22 MED ORDER — TRAZODONE HCL 50 MG PO TABS: 25.0000 mg | ORAL_TABLET | Freq: Every evening | ORAL | 0 refills | Status: DC | PRN

## 2022-07-22 MED ORDER — ASCORBIC ACID 1000 MG PO TABS: 1000.0000 mg | ORAL_TABLET | Freq: Every day | ORAL | 0 refills | Status: DC

## 2022-07-22 MED ORDER — HYDROCORTISONE (PERIANAL) 2.5 % EX CREA: TOPICAL_CREAM | Freq: Two times a day (BID) | CUTANEOUS | 0 refills | Status: DC | PRN

## 2022-07-22 MED ORDER — FLUOXETINE HCL 20 MG PO CAPS: 20.0000 mg | ORAL_CAPSULE | Freq: Every day | ORAL | 0 refills | Status: DC

## 2022-07-22 MED ORDER — NADOLOL 20 MG PO TABS: 20.0000 mg | ORAL_TABLET | Freq: Every morning | ORAL | 0 refills | Status: DC

## 2022-07-22 MED ORDER — LEVETIRACETAM 750 MG PO TABS: 750.0000 mg | ORAL_TABLET | Freq: Two times a day (BID) | ORAL | 0 refills | Status: DC

## 2022-07-22 MED ORDER — LINEZOLID 600 MG PO TABS: 600.0000 mg | ORAL_TABLET | Freq: Two times a day (BID) | ORAL | 0 refills | Status: DC

## 2022-07-22 MED ORDER — JUVEN PO PACK: 1.0000 | PACK | Freq: Two times a day (BID) | ORAL | 0 refills | Status: DC

## 2022-07-22 MED ORDER — FUROSEMIDE 40 MG PO TABS
40.0000 mg | ORAL_TABLET | Freq: Every day | ORAL | 0 refills | Status: DC
  Filled 2022-07-22: qty 30, 30d supply, fill #0

## 2022-07-22 MED ORDER — FUROSEMIDE 40 MG PO TABS: 40.0000 mg | ORAL_TABLET | Freq: Every day | ORAL | 0 refills | Status: DC

## 2022-07-22 MED ORDER — ORITAVANCIN DIPHOSPHATE 400 MG IV SOLR
1200.0000 mg | Freq: Once | INTRAVENOUS | Status: AC
  Administered 2022-07-22: 1200 mg via INTRAVENOUS
  Filled 2022-07-22: qty 120

## 2022-07-22 MED ORDER — VITAMIN B-1 100 MG PO TABS: 100.0000 mg | ORAL_TABLET | Freq: Every day | ORAL | Status: DC

## 2022-07-22 MED ORDER — LACTULOSE 10 GM/15ML PO SOLN: 20.0000 g | Freq: Two times a day (BID) | ORAL | 0 refills | Status: DC

## 2022-07-22 MED ORDER — GABAPENTIN 300 MG PO CAPS
600.0000 mg | ORAL_CAPSULE | Freq: Three times a day (TID) | ORAL | 0 refills | Status: DC
  Filled 2022-07-22: qty 180, 30d supply, fill #0

## 2022-07-22 MED ORDER — TRAZODONE HCL 50 MG PO TABS
25.0000 mg | ORAL_TABLET | Freq: Every evening | ORAL | 0 refills | Status: DC | PRN
  Filled 2022-07-22: qty 15, 15d supply, fill #0

## 2022-07-22 MED ORDER — CLONAZEPAM 0.5 MG PO TABS
0.5000 mg | ORAL_TABLET | Freq: Two times a day (BID) | ORAL | 0 refills | Status: DC | PRN
  Filled 2022-07-22: qty 30, 15d supply, fill #0

## 2022-07-22 MED ORDER — FOLIC ACID 1 MG PO TABS: 1.0000 mg | ORAL_TABLET | Freq: Every day | ORAL | Status: DC

## 2022-07-22 MED ORDER — DULOXETINE HCL 60 MG PO CPEP
60.0000 mg | ORAL_CAPSULE | Freq: Every day | ORAL | 0 refills | Status: DC
  Filled 2022-07-22: qty 30, 30d supply, fill #0

## 2022-07-22 NOTE — Progress Notes (Addendum)
Inpatient Rehabilitation Discharge Medication Review by a Pharmacist  A complete drug regimen review was completed for this patient to identify any potential clinically significant medication issues.  High Risk Drug Classes Is patient taking? Indication by Medication  Antipsychotic Yes Abilify - hx bipolar dz  Anticoagulant Yes   Antibiotic No   Opioid Yes Dilaudid PO - moderate pain  Antiplatelet No   Hypoglycemics/insulin No   Vasoactive Medication Yes Lasix, spironolactone, nadolol - cirrhosis/varices  Chemotherapy No   Other Yes Protonix- GERD     Type of Medication Issue Identified Description of Issue Recommendation(s)  Drug Interaction(s) (clinically significant)     Duplicate Therapy     Allergy     No Medication Administration End Date     Incorrect Dose     Additional Drug Therapy Needed     Significant med changes from prior encounter (inform family/care partners about these prior to discharge).    Other       Clinically significant medication issues were identified that warrant physician communication and completion of prescribed/recommended actions by midnight of the next day:  No  Name of provider notified for urgent issues identified:   Provider Method of Notification:     Pharmacist comments:   Time spent performing this drug regimen review (minutes):  15  Latanya Hemmer BS, PharmD, BCPS Clinical Pharmacist 07/22/2022 10:04 AM  Contact: [phone] after 3 PM  "Be curious, not judgmental..." -Jamal Maes

## 2022-07-22 NOTE — Progress Notes (Signed)
PICC removed per order.  Vaseline gauze and gauze pressure dressing applied with manual pressure held x 5 minutes.  No bleeding post removal.  Patient instructed to leave dressing on for 24 hours and do not get wet, what to do if bleeding occurs.  Patient and Baxter Kail, RN made aware that patient needs to stay laying in bed for 30 minutes (1325).

## 2022-07-22 NOTE — Progress Notes (Signed)
Inpatient Rehabilitation Care Coordinator Discharge Note   Patient Details  Name: Anastasija Anfinson MRN: [id number] Date of Birth: October 02, 1970   Discharge location: HOME ALONE WITH INTERMITTENT ASSIST FROM NEIGHBOR AND PREACHER AND HIS WIFE  Length of Stay: 17 DAYS  Discharge activity level: MOD/I WHEELCHAIR LEVEL  Home/community participation: ACTIVE  Patient response [id number] Literacy - How often do you need to have someone help you when you read instructions, pamphlets, or other written material from your doctor or pharmacy?: Never  Patient response [id number] Isolation - How often do you feel lonely or isolated from those around you?: Rarely  Services provided included: MD, RD, PT, OT, SLP, RN, CM, TR, Pharmacy, Neuropsych, SW  Financial Services:  Charity fundraiser Utilized: Medicaid    Choices offered to/list presented to: PT  Follow-up services arranged:  Home Health, DME, Patient/Family has no preference for HH/DME agencies [place]: [place] RN    DME : [facility], [facility], [employer] [facility] IN 1    Patient response to transportation need: Is the patient able to respond to transportation needs?: Yes In the past 12 months, has lack of transportation kept you from medical appointments or from getting medications?: No In the past 12 months, has lack of transportation kept you from meetings, work, or from getting things needed for daily living?: No    Comments (or additional information): PT DID WELL AND PREACHER AND HIS WIFE WERE IN FOR EDUCATION. PT FEELS READY TO GO HOME  Patient/Family verbalized understanding of follow-up arrangements:  Yes  Individual responsible for coordination of the follow-up plan: SELF AND [name]-SISTER [phone]  Confirmed correct DME delivered: Elease Hashimoto 07/22/2022    Elease Hashimoto

## 2022-07-22 NOTE — Progress Notes (Signed)
   07/22/22 1058  What Happened  Was fall witnessed? Yes  Who witnessed fall? RN  Patients activity before fall bathroom-unassisted  Point of contact hip/leg  Was patient injured? Yes  Follow Up  MD notified Risa Grill  Time MD notified [phone]  Family notified No - patient refusal  Simple treatment Dressing  Progress note created (see row info) Yes  Adult Fall Risk Assessment  Risk Factor Category (scoring not indicated) High fall risk per protocol (document High fall risk)  Age 51  Fall History: Fall within 6 months prior to admission 0  Elimination; Bowel and/or Urine Incontinence 0  Elimination; Bowel and/or Urine Urgency/Frequency 0  Medications: includes PCA/Opiates, Anti-convulsants, Anti-hypertensives, Diuretics, Hypnotics, Laxatives, Sedatives, and Psychotropics 5  Patient Care Equipment 1  Mobility-Assistance 2  Mobility-Gait 2  Mobility-Sensory Deficit 0  Altered awareness of immediate physical environment 0  Impulsiveness 0  Lack of understanding of one's physical/cognitive limitations 0  Total Score 10  Patient Fall Risk Level Moderate fall risk  Adult Fall Risk Interventions  Required Bundle Interventions *See Row Information* Moderate fall risk - low and moderate requirements implemented  Additional Interventions Use of appropriate toileting equipment (bedpan, BSC, etc.)  Screening for Fall Injury Risk (To be completed on HIGH fall risk patients) - Assessing Need for Floor Mats  Risk For Fall Injury- Criteria for Floor Mats None identified - No additional interventions needed  Vitals  Temp 98.2 F (36.8 C)  BP 94/64  MAP (mmHg) 75  BP Location Left Arm  BP Method Automatic  Patient Position (if appropriate) Lying  Pulse Rate 86  Pulse Rate Source Monitor  Resp 18  Oxygen Therapy  SpO2 94 %  O2 Device Room Air  Pain Assessment  Pain Scale 0-10  Pain Score 3  Pain Type Acute pain  Pain Location Leg  Pain Orientation Right  Pain Intervention(s)  Repositioned  Neurological  Neuro (WDL) WDL  Level of Consciousness Alert  Orientation Level Oriented X4  Cognition Follows commands  RUE Motor Response Purposeful movement  LUE Motor Response Purposeful movement  RLE Motor Response Purposeful movement  LLE Motor Response Purposeful movement  Integumentary  Integumentary (WDL) WDL  Skin Color Appropriate for ethnicity

## 2022-07-22 NOTE — Progress Notes (Addendum)
Examined patient at approximately 10:30 after call that patient had gotten out of bed and bumped her incision. The patient stated she needed to void and got up without assistance to get to her rolling walker. Her limb protector was not adequately fastened and it fell off as she got up. She has one bleeding point from lateral incision. Pressure dressing applied and patient returned to bed. I was asked by charge RN approximately 15 minutes later if patient's discharge was being delayed due to bleeding and I asked if she had already bled through her dressing. I re-entered the patient's room and she was resting comfortably in bed with limb protector in place. I again told nursing staff I would re-examine wound after IV antibiotics completed. I also explained that with low platelets she may have intermittent issue with oozing and to keep dry dressing with Ace wrap in place as needed.  CBC    Component Value Date/Time   WBC 3.3 (L) 07/22/2022 0404   RBC 3.98 07/22/2022 0404   HGB 11.8 (L) 07/22/2022 0404   HGB 14.3 11/15/2020 1034   HGB 14.4 01/04/2020 1510   HCT 36.2 07/22/2022 0404   HCT 43.4 01/04/2020 1510   PLT 49 (L) 07/22/2022 0404   PLT 121 (L) 11/15/2020 1034   PLT 60 (LL) 01/04/2020 1510   MCV 91.0 07/22/2022 0404   MCV 87 04/15/2022 0000   MCH 29.6 07/22/2022 0404   MCHC 32.6 07/22/2022 0404   RDW 21.3 (H) 07/22/2022 0404   RDW 16.0 (H) 01/04/2020 1510   LYMPHSABS 1.1 07/19/2022 0429   LYMPHSABS 0.9 01/04/2020 1510   MONOABS 0.5 07/19/2022 0429   EOSABS 0.3 07/19/2022 0429   EOSABS 0.1 01/04/2020 1510   BASOSABS 0.0 07/19/2022 0429   BASOSABS 0.0 01/04/2020 1510   Re-examined patient at 1400 hours. Abx infusion completed and PICC line out. Some thin bloody fluid on dressing from small area of incision. No dehiscence. No hemorrhage. Dressing replaced with ace wrap and compression sock. I verified with infusion center that her trnasfusion for platelets is active for this coming  Thursday. She will not need labd draw on Wednesday since she had CBC this morning. Patient acknowledges understanding of making transportation arrangements for this.

## 2022-07-22 NOTE — Progress Notes (Signed)
PROGRESS NOTE   Subjective/Complaints:   Pt ready for d/c-asking what time to be picked up- I said ~ 11am We discussed that needs a particular IV ABX< but then will remove PICC line.   Says having a tiny amount of drainage-   ROS:   Pt denies SOB, abd pain, CP, N/V/C/D, and vision changes   Except for HPI     Objective:   No results found. Recent Labs    07/22/22 0404  WBC 3.3*  HGB 11.8*  HCT 36.2  PLT 49*    Recent Labs    07/22/22 0404  NA 130*  K 3.6  CL 97*  CO2 26  GLUCOSE 92  BUN 5*  CREATININE 0.52  CALCIUM 7.7*     Intake/Output Summary (Last 24 hours) at 07/22/2022 0914 Last data filed at 07/22/2022 0145 Gross per 24 hour  Intake 653 ml  Output --  Net 653 ml        Physical Exam: Vital Signs Blood pressure 99/71, pulse 97, temperature 98.2 F (36.8 C), temperature source Oral, resp. rate 18, height '4\' 11"'$  (1.499 m), weight 63.9 kg, SpO2 96 %.    General: awake, alert, appropriate, sitting up on EOB;  NAD HENT: conjugate gaze; oropharynx moist CV: regular rate; no JVD Pulmonary: CTA B/L; no W/R/R- good air movement GI: soft, NT, ND, (+)BS Psychiatric: appropriate Neurological: alert MS: R BKA  has shrinker and limb guard in place- dressing over Shrinker Extremities: No clubbing, cyanosis  Neurologic: Cranial nerves II through XII intact, motor strength is 5/5 in bilateral deltoid, bicep, tricep, grip, B hip flexor,left  knee extensors, ankle dorsiflexor and plantar flexor  Musculoskeletal: Full range of motion in all 4 extremities. No joint swelling    Above 11/7   11/9    Assessment/Plan: 1. Functional deficits which require 3+ hours per day of interdisciplinary therapy in a comprehensive inpatient rehab setting. Physiatrist is providing close team supervision and 24 hour management of active medical problems listed below. Physiatrist and rehab team continue to  assess barriers to discharge/monitor patient progress toward functional and medical goals  Care Tool:  Bathing    Body parts bathed by patient: Right arm, Left arm, Chest, Abdomen, Front perineal area, Right upper leg, Left upper leg, Left lower leg, Face, Buttocks   Body parts bathed by helper: Buttocks Body parts n/a: Right lower leg   Bathing assist Assist Level: Independent with assistive device     Upper Body Dressing/Undressing Upper body dressing   What is the patient wearing?: Pull over shirt, Bra    Upper body assist Assist Level: Independent    Lower Body Dressing/Undressing Lower body dressing      What is the patient wearing?: Underwear/pull up, Pants     Lower body assist Assist for lower body dressing: Independent with assitive device     Toileting Toileting    Toileting assist Assist for toileting: Independent with assistive device Assistive Device Comment: walker, bsc   Transfers Chair/bed transfer  Transfers assist     Chair/bed transfer assist level: Supervision/Verbal cueing     Locomotion Ambulation   Ambulation assist   Ambulation activity did not occur:  Safety/medical concerns (2/2 wound vac on R BKA)  Assist level: Supervision/Verbal cueing Assistive device: Walker-rolling Max distance: 30 ft   Walk 10 feet activity   Assist  Walk 10 feet activity did not occur: Safety/medical concerns (2/2 wound vac on R BKA)  Assist level: Supervision/Verbal cueing Assistive device: Walker-rolling   Walk 50 feet activity   Assist Walk 50 feet with 2 turns activity did not occur: Safety/medical concerns (unable to perform due to strength and activity tolerance deficits)         Walk 150 feet activity   Assist Walk 150 feet activity did not occur: Safety/medical concerns (unable to perform due to strength and activity tolerance deficits)         Walk 10 feet on uneven surface  activity   Assist Walk 10 feet on uneven  surfaces activity did not occur: Safety/medical concerns (unable to perform due to strength, balance, coordination and activity tolerance deficits)         Wheelchair     Assist Is the patient using a wheelchair?: Yes Type of Wheelchair: Manual    Wheelchair assist level: Set up assist Max wheelchair distance: 200 ft    Wheelchair 50 feet with 2 turns activity    Assist        Assist Level: Independent   Wheelchair 150 feet activity     Assist      Assist Level: Independent   Blood pressure 99/71, pulse 97, temperature 98.2 F (36.8 C), temperature source Oral, resp. rate 18, height '4\' 11"'$  (1.499 m), weight 63.9 kg, SpO2 96 %.  Medical Problem List and Plan: 1. Functional deficits secondary to right BKA             -patient may shower with tubing clamped, vac disconnected. Do not submerge.              -ELOS/Goals: 10-14 days             - Lives alone in handicapped accessible 1 floor apartment    D/c date 11/13  D/c today 2.  Antithrombotics: -DVT/anticoagulation:  Pharmaceutical: Lovenox             -antiplatelet therapy: none             -LUE duplex ordered on admission for edema   10/31- will order LLE Dopplers to rule out DVT since has abrupt onset/worsening LLE edema  11/1- Dopplers (-)- done today- due to continuing bleeding/hemorrhoids and hx fof frequent plt transfusions, will stop lovenox per pt request 3. Pain Management: Tylenol, Robaxin, Dilaudid 4 mg BID prn as of 11/11   10/30- will add gabapentin 300 mg BID- goal to get off pain meds by d/c that are controlled.   10/31- still having pain, but explained that phantom pain doesn't respond to opiates- it responds to nerve pain meds- said has tolerated gabapentin in past  11/1- increased gabapentin to 300 mg QID  11/3- increased gabapentin to 600 mg TID- will look at weaning dilaudid next week  11/4-5 encouraged pt to massage, rub stump. Continue meds as above  11/6- will reduce Dilaudid to q6  hours prn  11/7- educated pt will con't to wean Dilaudid and will not go home on it.   11/9- decrease Dilaudid to q8 hours prn- with goal of stopping by d/c.   11/10- need to decrease dilaudid to q12 hours on Saturday  11/13- d/c without opiates 4. Mood/Behavior/Sleep: LCSW to evaluate and provide emotional support             -  h/o bipolar>>continue Abilify, Prozac             -h/o anxiety>>continue clonazepam 0.5 mg BID as needed             -antipsychotic agents: n/a   5. Neuropsych/cognition: This patient is capable of making decisions on her own behalf.             -SLP eval for difficulty with higher cognitive tasks   11/1- has higher level cognitive issues- will need assist for money/med mgmt. [facility] [place] 6. Skin/Wound Care: Routine skin care checks             -  maintain negative pressure dressing dressing through next Tuesday 10/31             - Wound vac per ortho             10/31- will write for [facility] removal  11/1- removed- has two new small sacral spots  11/4- wound looks ok. Can use simply silver infused shrinker per Dr. Sharol Given protocol.   11/7- a little more red? Not sure if due to pressure of limb guard? Will recheck CBC in AM- already on IV ABX.   11/8- WBC 4.8- and less erythema on exam 11/9- A little trace red drainage from incision, however less erythema- likely due to wearing shrinker?  11/10- no more drainage- con't regimen- got new shrinkers 7. 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 Juleen China: At discharge from rehab, DC PICC after 1x dose of oritavancin; then transition to oral linezolid to complete course at 11/21   11/13- let pt know going home on PO ABX- given dose of oritavancin today 1200 mg x1 9:  Alcoholic cirrhosis/ascites/elevated LFTs/esophageal varices:             -continue Lasix 40 mg daily             -continue lactulose 20 grams BID (goal 2-3 bowel movements daily)             -continue nadolol 20 mg daily             -continue aldactone 100 mg daily             -continue Folvite, thiamine supplementation             -Gets OP Qmonthly ascites taps; will need to arrange with IR while admitted   10/30- having 4-7 BM's day will need ot check with next paracentesis is needed  11/1- will get paracentesis today- got out 800cc  11/3- looks better  11/5 discussed with pt need for lactulose given liver disease  11/7- intermittently to more frequent refusal of lactulose- educated cannot refuse lactulose. Also refused hemorrhoid meds.   11/8- pt says having 4-7 Bms/day- educated ot not miss lactulose 10: Seizure disorder: continue Keppra   11. History polysubstance abuse            - Hx alcohol, THC - none current per patient            - Plan to DC picc prior to discharge as above  12. AKI on CKD stage 3. Admission labs. Baseline Cr Appears 0.4-0.7.       Latest Ref Rng & Units 07/22/2022    4:04 AM 07/17/2022    2:55 AM 07/15/2022    3:39 AM  BMP  Glucose 70 - 99 mg/dL 92  108  92   BUN 6 - 20 mg/dL '5  9  7   '$ Creatinine 0.44 - 1.00 mg/dL 0.52  0.50  0.59   Sodium 135 - 145 mmol/L 130  133  128   Potassium 3.5 - 5.1 mmol/L 3.6  4.1  4.0   Chloride 98 - 111 mmol/L 97  99  96   CO2 22 - 32 mmol/L '26  23  25   '$ Calcium 8.9 - 10.3 mg/dL 7.7  8.2  7.6     AKI resolved   13.  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  11/9- will recheck in AM- off lovenox  11/13- Plts 49k- will need transfusion per her regular schedule 14. 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   11/1- up to 300 mg QID  11/3- increase to 600 mg TID  11/4 see pain discussion above in #3  11/8- will add Duloxetine 30 mg nightly-   11/9- risk benefit ratio of Duloxetine with low plts- and nerve pain that cannot use opiates due to polysubstance abuse after d/c.   11/10- will increase Duloxetine to 60 mg QHS- appears that pt's nerve pain a little better since added it- don't want to increase Gabapentin due to sedation.  15. Hypokalemia  10/30- will replete 40 mEq x1 and recheck later in week  11/2- K+  3.9 16. LLE edema  10/31- will get Dopplers of LLE since has edema and has missed multiple doses of lovenox- due to refusal- also ordered TEDs for LLE  11/1- no DVT 17. Cough  10/31- ordered Tessalon pearles TID prn for cough- already has robitussin prn. 11/9- resolved  18. Anal bleeding/hemorrhoids (pt said she 11/1 - started anusol suppository, cream and witch hazel pads.  11/2- no bleeding in last 12+ hours-   11/9- hemorrhoids- no bleeding lately- has been refusing Anusol- will make prn cream, but d/c Supp.  18. Hyponatremia  11/6- Na down to 128- is unusual for her- will recheck Wednesday and monitor  11/7- labs in AM  11/8- Na up to 133-  19. Epistaxis  Resolved    I spent a total of 32   minutes on total care today- >50% coordination of care- due to d/w pa about ABX and with nursing about removing PICC AFTER IV ABX dose.     LOS: 17 days A FACE TO FACE EVALUATION WAS PERFORMED  Dwyne Hasegawa 07/22/2022, 9:14 AM

## 2022-07-23 ENCOUNTER — Telehealth: Payer: Self-pay

## 2022-07-23 NOTE — Telephone Encounter (Signed)
Pt is s/p a right BKA on 07/02/2022 nad d/c from the hospital yesterday. Can you please call and make an appt for follow up with Dr. Sharol Given or Junie Panning for Monday or Tuesday next week please?

## 2022-07-23 NOTE — Telephone Encounter (Signed)
Tuesday at 10:30 with Junie Panning

## 2022-07-24 ENCOUNTER — Other Ambulatory Visit (HOSPITAL_COMMUNITY): Payer: Self-pay

## 2022-07-24 ENCOUNTER — Other Ambulatory Visit: Payer: Medicaid Other

## 2022-07-24 ENCOUNTER — Encounter: Payer: Self-pay | Admitting: Oncology

## 2022-07-25 ENCOUNTER — Inpatient Hospital Stay: Payer: No Typology Code available for payment source | Attending: Hematology & Oncology

## 2022-07-25 VITALS — BP 95/65 | HR 109 | Temp 98.2°F | Resp 18 | Ht 59.0 in

## 2022-07-25 DIAGNOSIS — D693 Immune thrombocytopenic purpura: Secondary | ICD-10-CM | POA: Insufficient documentation

## 2022-07-25 DIAGNOSIS — D696 Thrombocytopenia, unspecified: Secondary | ICD-10-CM

## 2022-07-25 MED ORDER — ROMIPLOSTIM 250 MCG ~~LOC~~ SOLR
5.8500 ug/kg | Freq: Once | SUBCUTANEOUS | Status: AC
  Administered 2022-07-25: 375 ug via SUBCUTANEOUS
  Filled 2022-07-25: qty 0.5

## 2022-07-25 NOTE — Progress Notes (Signed)
Scheduled transportation for DOS 11/16, 11/20, 11/22, 11/29, and 11/30.

## 2022-07-25 NOTE — Patient Instructions (Signed)
Romiplostim Injection What is this medication? ROMIPLOSTIM (roe mi PLOE stim) treats low levels of platelets in your body caused by immune thrombocytopenia (ITP). It is prescribed when other medications have not worked or cannot be tolerated. It may also be used to help people who have been exposed to high doses of radiation. It works by increasing the amount of platelets in your blood. This lowers the risk of bleeding. This medicine may be used for other purposes; ask your health care provider or pharmacist if you have questions. COMMON BRAND NAME(S): Nplate What should I tell my care team before I take this medication? They need to know if you have any of these conditions: Blood clots Myelodysplastic syndrome An unusual or allergic reaction to romiplostim, mannitol, other medications, foods, dyes, or preservatives Pregnant or trying to get pregnant Breast-feeding How should I use this medication? This medication is injected under the skin. It is given by a care team in a hospital or clinic setting. A special MedGuide will be given to you before each treatment. Be sure to read this information carefully each time. Talk to your care team about the use of this medication in children. While it may be prescribed for children as young as newborns for selected conditions, precautions do apply. Overdosage: If you think you have taken too much of this medicine contact a poison control center or emergency room at once. NOTE: This medicine is only for you. Do not share this medicine with others. What if I miss a dose? Keep appointments for follow-up doses. It is important not to miss your dose. Call your care team if you are unable to keep an appointment. What may interact with this medication? Interactions are not expected. This list may not describe all possible interactions. Give your health care provider a list of all the medicines, herbs, non-prescription drugs, or dietary supplements you use. Also  tell them if you smoke, drink alcohol, or use illegal drugs. Some items may interact with your medicine. What should I watch for while using this medication? Visit your care team for regular checks on your progress. You may need blood work done while you are taking this medication. Your condition will be monitored carefully while you are receiving this medication. It is important not to miss any appointments. What side effects may I notice from receiving this medication? Side effects that you should report to your care team as soon as possible: Allergic reactions--skin rash, itching, hives, swelling of the face, lips, tongue, or throat Blood clot--pain, swelling, or warmth in the leg, shortness of breath, chest pain Side effects that usually do not require medical attention (report to your care team if they continue or are bothersome): Dizziness Joint pain Muscle pain Pain in the hands or feet Stomach pain Trouble sleeping This list may not describe all possible side effects. Call your doctor for medical advice about side effects. You may report side effects to FDA at 1-800-FDA-1088. Where should I keep my medication? This medication is given in a hospital or clinic. It will not be stored at home. NOTE: This sheet is a summary. It may not cover all possible information. If you have questions about this medicine, talk to your doctor, pharmacist, or health care provider.  2023 Elsevier/Gold Standard (2021-12-04 00:00:00)  

## 2022-07-29 ENCOUNTER — Inpatient Hospital Stay: Payer: No Typology Code available for payment source

## 2022-07-29 ENCOUNTER — Other Ambulatory Visit (HOSPITAL_COMMUNITY): Payer: Self-pay

## 2022-07-29 ENCOUNTER — Inpatient Hospital Stay: Payer: Medicaid Other | Admitting: Internal Medicine

## 2022-07-29 ENCOUNTER — Other Ambulatory Visit: Payer: Self-pay

## 2022-07-29 DIAGNOSIS — D693 Immune thrombocytopenic purpura: Secondary | ICD-10-CM | POA: Diagnosis not present

## 2022-07-29 DIAGNOSIS — D696 Thrombocytopenia, unspecified: Secondary | ICD-10-CM

## 2022-07-29 LAB — CBC WITH DIFFERENTIAL (CANCER CENTER ONLY)
Abs Immature Granulocytes: 0.02 10*3/uL (ref 0.00–0.07)
Basophils Absolute: 0 10*3/uL (ref 0.0–0.1)
Basophils Relative: 0 %
Eosinophils Absolute: 0 10*3/uL (ref 0.0–0.5)
Eosinophils Relative: 1 %
HCT: 41 % (ref 36.0–46.0)
Hemoglobin: 13.7 g/dL (ref 12.0–15.0)
Immature Granulocytes: 0 %
Lymphocytes Relative: 24 %
Lymphs Abs: 1.2 10*3/uL (ref 0.7–4.0)
MCH: 30.5 pg (ref 26.0–34.0)
MCHC: 33.4 g/dL (ref 30.0–36.0)
MCV: 91.3 fL (ref 80.0–100.0)
Monocytes Absolute: 0.4 10*3/uL (ref 0.1–1.0)
Monocytes Relative: 8 %
Neutro Abs: 3.4 10*3/uL (ref 1.7–7.7)
Neutrophils Relative %: 67 %
Platelet Count: 44 10*3/uL — ABNORMAL LOW (ref 150–400)
RBC: 4.49 MIL/uL (ref 3.87–5.11)
RDW: 19.1 % — ABNORMAL HIGH (ref 11.5–15.5)
WBC Count: 5.1 10*3/uL (ref 4.0–10.5)
nRBC: 0 % (ref 0.0–0.2)

## 2022-07-29 NOTE — Progress Notes (Deleted)
[facility]  CHIEF COMPLAINT:    Follow up for ***  SUBJECTIVE:    Briana Sweeney is a 51 y.o. female with PMHx as below who presents to the clinic for MRSA bacteremia.   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  Follow up blood cultures were no growth and TTE was negative for vegetations.  TEE was not pursued due to hx of decompensated cirrhosis with esophageal varices.  Plan was for 4 weeks of antibiotics from the date of her surgery ending on 07/30/22.    She was treated with Daptomycin while inpatient.  She was discharged from CIR on 07/22/22 after receiving a dose of oritavancin that date.  She was not discharged with further antibiotics as this dose provided adequate coverage through 07/30/22.  Please see A&P for the details of today's visit and status of the patient's medical problems.   Patient's Medications  New Prescriptions   No medications on file  Previous Medications   ACAMPROSATE (CAMPRAL) 333 MG TABLET    Take 2 tablets (666 mg total) by mouth 3 (three) times daily   ACETAMINOPHEN (TYLENOL) 325 MG TABLET    Take 1-2 tablets (325-650 mg total) by mouth every 6 (six) hours as needed for mild pain.   ARIPIPRAZOLE (ABILIFY) 10 MG TABLET    Take 1 tablet (10 mg total) by mouth daily.   ASCORBIC ACID (VITAMIN C) 1000 MG TABLET    Take 1 tablet (1,000 mg total) by mouth daily.   CLONAZEPAM (KLONOPIN) 0.5 MG TABLET    Take 1 tablet (0.5 mg total) by mouth 2 (two) times daily as needed (anxiety).   DULOXETINE (CYMBALTA) 60 MG CAPSULE    Take 1 capsule (60 mg total) by mouth at bedtime.   FLUOXETINE (PROZAC) 20 MG CAPSULE    Take 1 capsule (20 mg total) by mouth daily.   FOLIC ACID (FOLVITE) 1 MG TABLET    Take 1 tablet (1 mg total) by mouth daily.   FUROSEMIDE (LASIX) 40 MG TABLET    Take 1 tablet (40 mg total) by mouth daily.   GABAPENTIN (NEURONTIN) 300 MG  CAPSULE    Take 2 capsules (600 mg total) by mouth 3 (three) times daily.   HYDROCORTISONE (ANUSOL-HC) 2.5 % RECTAL CREAM    Place rectally 2 (two) times daily as needed for hemorrhoids or anal itching.   HYDROMORPHONE (DILAUDID) 4 MG TABLET    Take 1 tablet (4 mg total) by mouth 2 (two) times daily as needed for severe pain.   LACTULOSE (CHRONULAC) 10 GM/15ML SOLUTION    Take 30 mLs (20 g total) by mouth 2 (two) times daily.   LEVETIRACETAM (KEPPRA) 750 MG TABLET    Take 1 tablet (750 mg total) by mouth 2 (two) times daily.   MULTIPLE VITAMIN (MULTIVITAMIN WITH MINERALS) TABS TABLET    Take 1 tablet by mouth daily.   NADOLOL (CORGARD) 20 MG TABLET    Take 1 tablet (20 mg total) by mouth every morning.   NUTRITION SUPPLEMENT, JUVEN, (JUVEN) PACK    Take 1 packet by mouth 2 (two) times daily between meals.   PANTOPRAZOLE (PROTONIX) 40 MG TABLET    Take 1 tablet (40 mg total) by mouth daily.   SPIRONOLACTONE (ALDACTONE) 100 MG TABLET    Take 1 tablet (100 mg total) by mouth daily.   THIAMINE (VITAMIN  B-1) 100 MG TABLET    Take 1 tablet (100 mg total) by mouth daily.   TRAZODONE (DESYREL) 50 MG TABLET    Take 0.5-1 tablets (25-50 mg total) by mouth at bedtime as needed for sleep.  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   Ascites due to alcoholic cirrhosis ([place])    Bipolar affective disorder ([place])    With anxiety features   Bunionette of right foot 11/2019    Cirrhosis of liver ([place])    Due to alcohol and hepatitis C   Cocaine dependence without complication ([place]) 60/73/7106   Decompensated hepatic cirrhosis ([place]) 07/23/2017   Depression with anxiety    Epistaxis 01/20/2019   Erysipelas 09/13/2018   Esophageal 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 ([place])    Esophageal varices without bleeding ([facility])    ETOHism ([facility])    GERD (gastroesophageal reflux disease)    Head injury    Hematemesis 02/10/2018   Hepatitis C 2018   hepatitis c and alcohol related hepatitis   Heroin abuse ([place])    History of blood transfusion    "blood doesn't clot; I fell down and had to have a transfusion"   History of kidney stones    Menopause 2016   Migraine    "when I get really stressed" (09/01/2017)   Neuropathy 10/27/2018   Olecranon bursitis of left elbow    Other mixed anxiety disorders 10/27/2018   Overdose 08/22/2018   Pancytopenia ([place]) 07/23/2017   Polysubstance abuse ([place]) 04/08/2018   Portal hypertension ([place]) 03/18/2018   S/P foot surgery, right 11/2019   Schizophrenia ([place])    Seizures ([place])    "when I run out of my RX; lots recently" (09/01/2017)   Suicidal ideation    Thrombocytopenia ([place])    Trichimoniasis 10/30/2017   Upper GI bleed 02/10/2018   Urinary urgency 05/2020   UTI (urinary tract infection) 06/02/2017    Social History   Tobacco Use   Smoking status: Never   Smokeless tobacco: Never  Vaping Use   Vaping Use: Never used  Substance Use Topics   Alcohol use: Yes    Alcohol/week: 6.0 standard drinks of alcohol    Types: 6 Cans of beer per week    Comment: pt states she had 1 can beer on 02/26/22   Drug use: Not Currently    Family History  Problem Relation Age of Onset   Lung cancer Mother 13   Alcohol abuse Mother    Throat cancer Father 11    Allergies  Allergen Reactions   Other Other (See Comments)    Platelets: Rx chest pain, tremors, body aches  Patient stated that she receives platelet injection every Wednesday      ROS   OBJECTIVE:    There were no vitals filed for this visit. There is no height or weight on file to calculate BMI.  Physical Exam   Labs and Microbiology:    Latest Ref Rng & Units 07/22/2022  4:04 AM 07/19/2022    4:29 AM 07/17/2022    2:55 AM  CBC  WBC 4.0 - 10.5 K/uL 3.3  3.4  4.8   Hemoglobin 12.0 - 15.0 g/dL 11.8  11.7  10.8   Hematocrit 36.0 - 46.0 % 36.2  37.0  32.8   Platelets 150 - 400 K/uL 49  64  76       Latest Ref Rng & Units 07/22/2022    4:04 AM 07/17/2022    2:55 AM 07/15/2022    3:39 AM  CMP  Glucose 70 - 99 mg/dL 92  108  92   BUN 6 - 20 mg/dL '5  9  7   '$ Creatinine 0.44 - 1.00 mg/dL 0.52  0.50  0.59   Sodium 135 - 145 mmol/L 130  133  128   Potassium 3.5 - 5.1 mmol/L 3.6  4.1  4.0   Chloride 98 - 111 mmol/L 97  99  96   CO2 22 - 32 mmol/L '26  23  25   '$ Calcium 8.9 - 10.3 mg/dL 7.7  8.2  7.6      No results found for this or any previous visit (from the past 240 hour(s)).  Imaging: ***   ASSESSMENT & PLAN:    No problem-specific Assessment & Plan notes found for this encounter.   No orders of the defined types were placed in this encounter.    There are no diagnoses linked to this encounter.  Patient has completed her antibiotic course for right foot OM complicated by MRSA bacteremia.  She underwent definitive source control with BKA and then completed 4 weeks of therapy from date of her surgery.  She will continue to see orthopedics as needed and continue therapy as needed.  No further ID follow up needed for now.    Raynelle High[facility] 07/29/2022, 6:59 AM

## 2022-07-30 ENCOUNTER — Other Ambulatory Visit: Payer: Medicaid Other

## 2022-07-30 ENCOUNTER — Ambulatory Visit (INDEPENDENT_AMBULATORY_CARE_PROVIDER_SITE_OTHER): Payer: Medicaid Other | Admitting: Family

## 2022-07-30 ENCOUNTER — Encounter: Payer: Self-pay | Admitting: Family

## 2022-07-30 DIAGNOSIS — S88111A Complete traumatic amputation at level between knee and ankle, right lower leg, initial encounter: Secondary | ICD-10-CM

## 2022-07-30 DIAGNOSIS — Z89511 Acquired absence of right leg below knee: Secondary | ICD-10-CM

## 2022-07-30 NOTE — Progress Notes (Signed)
Office Visit Note   Patient: Briana Sweeney           Date of Birth: 08/17/1971           MRN: [id number] Visit Date: 07/30/2022              Requested by: Marco Collie, MD [place],  [place] [id number] PCP: Marice Potter, MD  Chief Complaint  Patient presents with   Right Leg - Routine Post Op    07/02/22 right bka w/ kerecis      HPI: The patient is a 51 year old woman who is seen today status post below-knee amputation on October 24 she has been wearing her heel protector states this unfortunately has been staying damp and has not been standing on her leg  Patient is a new right transtibial  amputee.  Patient's current comorbidities are not expected to impact the ability to function with the prescribed prosthesis. Patient verbally communicates a strong desire to use a prosthesis. Patient currently requires mobility aids to ambulate without a prosthesis.  Expects not to use mobility aids with a new prosthesis.  Patient is a K2 level ambulator that will use a prosthesis to walk around their home and the community over low level environmental barriers.      Assessment & Plan: Visit Diagnoses:  1. Below-knee amputation of right lower extremity ([place])     Plan: Staples harvested today proceed with prosthesis set up given an order for her right below-knee amputation initial prosthesis today.  Follow-Up Instructions: Return in about 2 weeks (around 08/13/2022).   Ortho Exam  Patient is alert, oriented, no adenopathy, well-dressed, normal affect, normal respiratory effort. On examination of the right residual limb staples are in place this is healing well there are 2 areas of dehiscence no bigger than 2 cm in diameter these do not probe these have filled in with granulation.  No odor or surrounding erythema no signs of infection  Imaging: No results found.     Labs: Lab Results  Component Value Date   HGBA1C 4.9 05/26/2020   HGBA1C 4.9 05/26/2020   HGBA1C 4.9 (A)  05/26/2020   HGBA1C 4.9 05/26/2020   REPTSTATUS 07/07/2022 FINAL 07/02/2022   GRAMSTAIN  05/14/2022    NO WBC SEEN NO ORGANISMS SEEN Performed at [facility], [place] [address], [place], [place] [address]    CULT  07/02/2022    NO GROWTH 5 DAYS Performed at [place] [address], [place], [place] [address]    LABORGA METHICILLIN RESISTANT STAPHYLOCOCCUS AUREUS 06/29/2022     Lab Results  Component Value Date   ALBUMIN 1.8 (L) 07/06/2022   ALBUMIN 1.9 (L) 07/05/2022   ALBUMIN 1.8 (L) 07/04/2022   PREALBUMIN <5 (L) 01/23/2022    Lab Results  Component Value Date   MG 1.5 (L) 07/06/2022   MG 1.8 05/15/2022   MG 1.5 (L) 05/13/2022   Lab Results  Component Value Date   VD25OH 55.7 12/13/2019   VD25OH 38.8 05/05/2019    Lab Results  Component Value Date   PREALBUMIN <5 (L) 01/23/2022      Latest Ref Rng & Units 07/29/2022   11:18 AM 07/22/2022    4:04 AM 07/19/2022    4:29 AM  CBC EXTENDED  WBC 4.0 - 10.5 K/uL 5.1  3.3  3.4   RBC 3.87 - 5.11 MIL/uL 4.49  3.98  4.02   Hemoglobin 12.0 - 15.0 g/dL 13.7  11.8  11.7  HCT 36.0 - 46.0 % 41.0  36.2  37.0   Platelets 150 - 400 K/uL 44  49  64   NEUT# 1.7 - 7.7 K/uL 3.4   1.4   Lymph# 0.7 - 4.0 K/uL 1.2   1.1      There is no height or weight on file to calculate BMI.  Orders:  No orders of the defined types were placed in this encounter.  No orders of the defined types were placed in this encounter.    Procedures: No procedures performed  Clinical Data: No additional findings.  ROS:  All other systems negative, except as noted in the HPI. Review of Systems  Objective: Vital Signs: LMP  (LMP Unknown) Comment: MENAPAUSE AT 51 YEARS OLD  Specialty Comments:  No specialty comments available.  PMFS History: Patient Active Problem List   Diagnosis Date Noted   Hx of BKA, right ([place]) 07/15/2022   Osteomyelitis of right foot ([place]) 07/05/2022   Cutaneous abscess of right foot 07/02/2022    MRSA bacteremia    Foot osteomyelitis, right ([place]) 06/30/2022   Sepsis ([place]) 06/29/2022   Portal vein thrombosis 05/15/2022   History of ITP 05/15/2022   GERD without esophagitis 05/14/2022   C. difficile colitis 81/15/7262   ALC (alcoholic liver cirrhosis) ([place]) 03/55/9741   Alcoholic cirrhosis of liver with ascites ([place]) 02/27/2022   Ascites 01/31/2022   Hyperkalemia 01/30/2022   Hepatic cirrhosis ([place]) 01/23/2022   Liver failure without hepatic coma ([place]) 01/22/2022   Hypophosphatemia 01/22/2022   Severe neutropenia ([place]) 12/16/2021   Cellulitis of right foot 12/16/2021   Severe protein-calorie malnutrition ([place]) 12/15/2021   Anasarca 11/01/2021   Acute posthemorrhagic anemia    Esophageal 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 ([place])    Abdominal pain 10/31/2021   Palpitations 10/11/2021   Bradycardia 07/23/2021   Secondary esophageal varices with bleeding ([place])    Alcohol withdrawal ([place]) 06/26/2021   Acute renal failure superimposed on stage 3a chronic kidney disease ([place]) 04/04/2021   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HCC) 12/18/2020   Chronic anemia 10/02/2020   Hyponatremia 04/02/2019   Anxiety and depression 10/27/2018   Neuropathy 10/27/2018   Substance induced mood disorder ([place]) 07/17/2018   Encephalopathy, portal systemic 04/08/2018   Seizure disorder ([place]) 03/18/2018   Hepatitis C 03/18/2018   Portal hypertension ([place]) 03/18/2018   Depression 03/18/2018   Thrombocytopenia ([place]) 01/02/2018   GERD (gastroesophageal reflux disease) 11/01/2017   Alcohol use disorder, severe, dependence ([place]) 10/29/2017   Esophageal varices without bleeding ([place])    Abdominal ascites    Coagulopathy ([place]) 07/23/2017   Pancytopenia ([place]) 07/23/2017   Past Medical History:  Diagnosis Date   Abscess of bursa of left elbow 09/23/2018   Acute hyperactive alcohol withdrawal delirium ([place]) 10/09/2017   Acute low back pain with bilateral sciatica    Acute metabolic encephalopathy  63/84/5364   Acute on chronic pancreatitis ([place]) 04/04/2021   AKI (acute kidney injury) ([place]) 04/04/2021   Alcohol withdrawal syndrome with complication ([place]) 68/11/2120   Alcohol-induced acute pancreatitis    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[place]) 12/05/2018   Ascites due to alcoholic cirrhosis ([place])    Bipolar affective disorder ([place])    With anxiety features   Bunionette of right foot 11/2019   Cirrhosis of liver ([place])    Due to alcohol and hepatitis C   Cocaine dependence without complication ([place]) 48/25/0037   Decompensated hepatic cirrhosis ([place]) 07/23/2017   Depression with anxiety    Epistaxis 01/20/2019   Erysipelas 09/13/2018  Esophageal 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 (HCC)    Esophageal varices without bleeding (HCC)    ETOHism (HCC)    GERD (gastroesophageal reflux disease)    Head injury    Hematemesis 02/10/2018   Hepatitis C 2018   hepatitis c and alcohol related hepatitis   Heroin abuse ([place])    History of blood transfusion    "blood doesn't clot; I fell down and had to have a transfusion"   History of kidney stones    Menopause 2016   Migraine    "when I get really stressed" (09/01/2017)   Neuropathy 10/27/2018   Olecranon bursitis of left elbow    Other mixed anxiety disorders 10/27/2018   Overdose 08/22/2018   Pancytopenia ([place]) 07/23/2017   Polysubstance abuse ([place]) 04/08/2018   Portal hypertension ([place]) 03/18/2018   S/P foot surgery, right 11/2019   Schizophrenia ([place])    Seizures ([place])    "when I run out of my RX; lots recently" (09/01/2017)   Suicidal ideation    Thrombocytopenia ([place])    Trichimoniasis 10/30/2017   Upper GI bleed 02/10/2018   Urinary urgency 05/2020   UTI (urinary tract infection) 06/02/2017    Family History  Problem Relation Age of Onset   Lung cancer Mother 84   Alcohol abuse Mother    Throat cancer Father 84    Past Surgical History:  Procedure Laterality Date   AMPUTATION Right 07/02/2022   Procedure: RIGHT BELOW KNEE AMPUTATION;   Surgeon: Newt Minion, MD;  Location: [facility];  Service: Orthopedics;  Laterality: Right;   APPLICATION OF WOUND VAC Right 07/02/2022   Procedure: APPLICATION OF WOUND VAC;  Surgeon: Newt Minion, MD;  Location: [facility];  Service: Orthopedics;  Laterality: Right;   BUNIONECTOMY     ESOPHAGEAL BANDING  06/26/2021   Procedure: ESOPHAGEAL BANDING;  Surgeon: Jackquline Denmark, MD;  Location: [facility] ENDOSCOPY;  Service: Endoscopy;;   ESOPHAGOGASTRODUODENOSCOPY N/A 09/03/2017   Procedure: ESOPHAGOGASTRODUODENOSCOPY (EGD);  Surgeon: Doran Stabler, MD;  Location: [facility];  Service: Gastroenterology;  Laterality: N/A;   ESOPHAGOGASTRODUODENOSCOPY N/A 02/01/2022   Procedure: ESOPHAGOGASTRODUODENOSCOPY (EGD);  Surgeon: Clarene Essex, MD;  Location: [facility];  Service: Gastroenterology;  Laterality: N/A;   ESOPHAGOGASTRODUODENOSCOPY (EGD) WITH PROPOFOL N/A 06/26/2021   Procedure: ESOPHAGOGASTRODUODENOSCOPY (EGD) WITH PROPOFOL;  Surgeon: Jackquline Denmark, MD;  Location[facility];  Service: Endoscopy;  Laterality: N/A;   FINGER FRACTURE SURGERY Left    "shattered my pinky"   FRACTURE SURGERY     I & D EXTREMITY Left 09/18/2018   Procedure: IRRIGATION AND DEBRIDEMENT EXTREMITY;  Surgeon: Leanora Cover, MD;  Location: [facility];  Service: Orthopedics;  Laterality: Left;   IR PARACENTESIS  07/23/2017   IR PARACENTESIS  07/2017   "did it twice in the same week" (09/01/2017)   IR PARACENTESIS  06/26/2021   IR PARACENTESIS  07/10/2022   SHOULDER OPEN ROTATOR CUFF REPAIR Right    TOOTH EXTRACTION  08/2019   TUBAL LIGATION     VAGINAL HYSTERECTOMY     Social History   Occupational History   Occupation: applying for disability  Tobacco Use   Smoking status: Never   Smokeless tobacco: Never  Vaping Use   Vaping Use: Never used  Substance and Sexual Activity   Alcohol use: Yes    Alcohol/week: 6.0 standard drinks of alcohol    Types: 6 Cans of beer per week    Comment: pt states she had 1 can beer on  02/26/22   Drug use:  Not Currently   Sexual activity: Not on file

## 2022-07-31 ENCOUNTER — Inpatient Hospital Stay: Payer: No Typology Code available for payment source

## 2022-07-31 VITALS — BP 85/58 | HR 72 | Temp 98.8°F | Resp 18 | Ht 59.0 in

## 2022-07-31 DIAGNOSIS — D693 Immune thrombocytopenic purpura: Secondary | ICD-10-CM | POA: Diagnosis not present

## 2022-07-31 DIAGNOSIS — D696 Thrombocytopenia, unspecified: Secondary | ICD-10-CM

## 2022-07-31 MED ORDER — ROMIPLOSTIM 250 MCG ~~LOC~~ SOLR
5.8500 ug/kg | Freq: Once | SUBCUTANEOUS | Status: AC
  Administered 2022-07-31: 375 ug via SUBCUTANEOUS
  Filled 2022-07-31: qty 0.5

## 2022-07-31 NOTE — Patient Instructions (Signed)
Romiplostim Injection What is this medication? ROMIPLOSTIM (roe mi PLOE stim) treats low levels of platelets in your body caused by immune thrombocytopenia (ITP). It is prescribed when other medications have not worked or cannot be tolerated. It may also be used to help people who have been exposed to high doses of radiation. It works by increasing the amount of platelets in your blood. This lowers the risk of bleeding. This medicine may be used for other purposes; ask your health care provider or pharmacist if you have questions. COMMON BRAND NAME(S): Nplate What should I tell my care team before I take this medication? They need to know if you have any of these conditions: Blood clots Myelodysplastic syndrome An unusual or allergic reaction to romiplostim, mannitol, other medications, foods, dyes, or preservatives Pregnant or trying to get pregnant Breast-feeding How should I use this medication? This medication is injected under the skin. It is given by a care team in a hospital or clinic setting. A special MedGuide will be given to you before each treatment. Be sure to read this information carefully each time. Talk to your care team about the use of this medication in children. While it may be prescribed for children as young as newborns for selected conditions, precautions do apply. Overdosage: If you think you have taken too much of this medicine contact a poison control center or emergency room at once. NOTE: This medicine is only for you. Do not share this medicine with others. What if I miss a dose? Keep appointments for follow-up doses. It is important not to miss your dose. Call your care team if you are unable to keep an appointment. What may interact with this medication? Interactions are not expected. This list may not describe all possible interactions. Give your health care provider a list of all the medicines, herbs, non-prescription drugs, or dietary supplements you use. Also  tell them if you smoke, drink alcohol, or use illegal drugs. Some items may interact with your medicine. What should I watch for while using this medication? Visit your care team for regular checks on your progress. You may need blood work done while you are taking this medication. Your condition will be monitored carefully while you are receiving this medication. It is important not to miss any appointments. What side effects may I notice from receiving this medication? Side effects that you should report to your care team as soon as possible: Allergic reactions--skin rash, itching, hives, swelling of the face, lips, tongue, or throat Blood clot--pain, swelling, or warmth in the leg, shortness of breath, chest pain Side effects that usually do not require medical attention (report to your care team if they continue or are bothersome): Dizziness Joint pain Muscle pain Pain in the hands or feet Stomach pain Trouble sleeping This list may not describe all possible side effects. Call your doctor for medical advice about side effects. You may report side effects to FDA at 1-800-FDA-1088. Where should I keep my medication? This medication is given in a hospital or clinic. It will not be stored at home. NOTE: This sheet is a summary. It may not cover all possible information. If you have questions about this medicine, talk to your doctor, pharmacist, or health care provider.  2023 Elsevier/Gold Standard (2021-12-04 00:00:00)  

## 2022-08-06 ENCOUNTER — Ambulatory Visit: Payer: Medicaid Other | Admitting: Plastic Surgery

## 2022-08-07 ENCOUNTER — Other Ambulatory Visit: Payer: Medicaid Other

## 2022-08-08 ENCOUNTER — Other Ambulatory Visit: Payer: Self-pay | Admitting: Physician Assistant

## 2022-08-08 ENCOUNTER — Emergency Department (HOSPITAL_COMMUNITY)
Admission: EM | Admit: 2022-08-08 | Discharge: 2022-08-08 | Disposition: A | Discharge status: Home / Self Care | Payer: No Typology Code available for payment source | Attending: Emergency Medicine | Admitting: Emergency Medicine

## 2022-08-08 ENCOUNTER — Inpatient Hospital Stay: Payer: No Typology Code available for payment source

## 2022-08-08 ENCOUNTER — Emergency Department (HOSPITAL_COMMUNITY): Payer: No Typology Code available for payment source

## 2022-08-08 ENCOUNTER — Other Ambulatory Visit (HOSPITAL_COMMUNITY): Payer: Self-pay

## 2022-08-08 DIAGNOSIS — T8743 Infection of amputation stump, right lower extremity: Secondary | ICD-10-CM | POA: Diagnosis not present

## 2022-08-08 DIAGNOSIS — Y658 Other specified misadventures during surgical and medical care: Secondary | ICD-10-CM | POA: Insufficient documentation

## 2022-08-08 DIAGNOSIS — L089 Local infection of the skin and subcutaneous tissue, unspecified: Secondary | ICD-10-CM

## 2022-08-08 LAB — CBC WITH DIFFERENTIAL/PLATELET
Abs Immature Granulocytes: 0.01 10*3/uL (ref 0.00–0.07)
Basophils Absolute: 0 10*3/uL (ref 0.0–0.1)
Basophils Relative: 0 %
Eosinophils Absolute: 0.2 10*3/uL (ref 0.0–0.5)
Eosinophils Relative: 4 %
HCT: 44 % (ref 36.0–46.0)
Hemoglobin: 13.9 g/dL (ref 12.0–15.0)
Immature Granulocytes: 0 %
Lymphocytes Relative: 29 %
Lymphs Abs: 1.7 10*3/uL (ref 0.7–4.0)
MCH: 29.6 pg (ref 26.0–34.0)
MCHC: 31.6 g/dL (ref 30.0–36.0)
MCV: 93.6 fL (ref 80.0–100.0)
Monocytes Absolute: 0.5 10*3/uL (ref 0.1–1.0)
Monocytes Relative: 8 %
Neutro Abs: 3.4 10*3/uL (ref 1.7–7.7)
Neutrophils Relative %: 59 %
Platelets: 165 10*3/uL (ref 150–400)
RBC: 4.7 MIL/uL (ref 3.87–5.11)
RDW: 17.2 % — ABNORMAL HIGH (ref 11.5–15.5)
WBC: 5.7 10*3/uL (ref 4.0–10.5)
nRBC: 0 % (ref 0.0–0.2)

## 2022-08-08 LAB — COMPREHENSIVE METABOLIC PANEL
ALT: 24 U/L (ref 0–44)
AST: 50 U/L — ABNORMAL HIGH (ref 15–41)
Albumin: 2.2 g/dL — ABNORMAL LOW (ref 3.5–5.0)
Alkaline Phosphatase: 250 U/L — ABNORMAL HIGH (ref 38–126)
Anion gap: 12 (ref 5–15)
BUN: 6 mg/dL (ref 6–20)
CO2: 24 mmol/L (ref 22–32)
Calcium: 8.1 mg/dL — ABNORMAL LOW (ref 8.9–10.3)
Chloride: 99 mmol/L (ref 98–111)
Creatinine, Ser: 0.55 mg/dL (ref 0.44–1.00)
GFR, Estimated: >60 mL/min (ref >60)
Glucose, Bld: 83 mg/dL (ref 70–99)
Potassium: 3 mmol/L — ABNORMAL LOW (ref 3.5–5.1)
Sodium: 135 mmol/L (ref 135–145)
Total Bilirubin: 2.2 mg/dL — ABNORMAL HIGH (ref 0.3–1.2)
Total Protein: 6.4 g/dL — ABNORMAL LOW (ref 6.5–8.1)

## 2022-08-08 LAB — LACTIC ACID, PLASMA: Lactic Acid, Venous: 2.6 mmol/L (ref 0.5–1.9)

## 2022-08-08 MED ORDER — HYDROMORPHONE HCL 1 MG/ML IJ SOLN
1.0000 mg | Freq: Once | INTRAMUSCULAR | Status: AC
  Administered 2022-08-08: 1 mg via INTRAMUSCULAR
  Filled 2022-08-08: qty 1

## 2022-08-08 MED ORDER — DOXYCYCLINE HYCLATE 100 MG PO CAPS
100.0000 mg | ORAL_CAPSULE | Freq: Two times a day (BID) | ORAL | 0 refills | Status: DC
  Filled 2022-08-08: qty 20, 10d supply, fill #0

## 2022-08-08 MED ORDER — DOXYCYCLINE HYCLATE 100 MG PO TABS
100.0000 mg | ORAL_TABLET | Freq: Once | ORAL | Status: AC
  Administered 2022-08-08: 100 mg via ORAL
  Filled 2022-08-08: qty 1

## 2022-08-08 MED ORDER — POTASSIUM CHLORIDE CRYS ER 20 MEQ PO TBCR
40.0000 meq | EXTENDED_RELEASE_TABLET | Freq: Once | ORAL | Status: AC
  Administered 2022-08-08: 40 meq via ORAL
  Filled 2022-08-08: qty 2

## 2022-08-08 MED ORDER — HYDROMORPHONE HCL 1 MG/ML IJ SOLN: 0.5000 mg | Freq: Once | INTRAMUSCULAR | Status: DC

## 2022-08-08 MED ORDER — HYDROMORPHONE HCL 4 MG PO TABS
4.0000 mg | ORAL_TABLET | Freq: Two times a day (BID) | ORAL | 0 refills | Status: DC | PRN
  Filled 2022-08-08: qty 14, 7d supply, fill #0

## 2022-08-08 MED ORDER — ACETAMINOPHEN 325 MG PO TABS
650.0000 mg | ORAL_TABLET | ORAL | Status: AC
  Administered 2022-08-08: 650 mg via ORAL
  Filled 2022-08-08: qty 2

## 2022-08-08 NOTE — ED Triage Notes (Signed)
Pt had right BKA 2-3 weeks ago and worried about infection at the site. Due to see her surgeon on the 9th. Pt also reports being out of her dilaudid for 2 days. Stump is red with some yellow drainage.

## 2022-08-08 NOTE — ED Provider Notes (Signed)
Supervised resident visit.  Patient here for evaluation for possible wound infection and her recent right BKA.  History of alcoholic abuse.  Unremarkable vitals.  No fever.  Blood work drawn in triage including CBC, CMP and lactic acid.  She has no fever.  Have no concern for sepsis.  Lactic acid is mildly elevated at 2.6 but appears that this is chronic.  This could be from liver dysfunction that she has at baseline.  Otherwise there is no significant leukocytosis or anemia or electrolyte abnormality.  She is very well-appearing.  The right BKA does have some redness and some purulence at the surgical site.  I talked with Annie Main with orthopedics who reviewed photos that were in the chart.  They will have patient start oral antibiotics and will follow-up closely outpatient.  I have started patient on doxycycline.  Will refill her Dilaudid pain medicine for breakthrough pain.  Wound care instructions given.  Return instructions given.  Discharged in good condition.  This chart was dictated using voice recognition software.  Despite best efforts to proofread,  errors can occur which can change the documentation meaning.    Lennice Sites, DO 08/08/22 1945

## 2022-08-08 NOTE — Discharge Instructions (Signed)
Take next dose antibiotic tomorrow morning.  Follow-up with Dr. Sharol Given as already scheduled.  Please return if you develop fever, worsening signs of infection.

## 2022-08-08 NOTE — ED Provider Triage Note (Signed)
Emergency Medicine Provider Triage Evaluation Note  Briana Sweeney , a 51 y.o. female  was evaluated in triage.  Pt complains of R stump pain that is severe. States she had infection -- seems osteo.  Feels she's been having worsening pain for the past 2 weeks (surgery was 1 month ago).  No fevers at home.  Some chills.   Review of Systems  Positive: R stump pain Negative: Fever  Physical Exam  BP 111/81 (BP Location: Right Arm)   Pulse (!) 113   Temp 98.5 F (36.9 C)   Resp 16   Ht '4\' 11"'$  (1.499 m)   LMP  (LMP Unknown) Comment: MENAPAUSE AT 51 YEARS OLD  SpO2 98%   BMI 28.45 kg/m  Gen:   Awake, no distress   Resp:  Normal effort  MSK:   Moves extremities without difficulty  Other:         Medical Decision Making  Medically screening exam initiated at 1:24 PM.  Appropriate orders placed.  Enna Florabel Faulks was informed that the remainder of the evaluation will be completed by another provider, this initial triage assessment does not replace that evaluation, and the importance of remaining in the ED until their evaluation is complete.  [address] [place], [place] 08/08/22 1327

## 2022-08-08 NOTE — ED Provider Notes (Signed)
[facility] EMERGENCY DEPARTMENT Provider Note   CSN: [id number] Arrival date & time: 08/08/22  1150     History  Chief Complaint  Patient presents with   Wound Infection    Briana Sweeney is a 51 y.o. female.  HPI The patient is a 51 year old female with past medical history of EtOH cirrhosis, esophageal varices, and recent right BKA (10/24) who is presenting for evaluation of pain and drainage in her amputation site and left foot pain.  The patient states that she has been out of her Dilaudid at home for the past few days and has had increasing pain.  She is also having clearish yellow drainage from the wound.  She denies recent fevers, nausea, vomiting, or additional trauma.  The patient is also endorsing several days of left foot pain without associated trauma.  She states the pain is localized to the dorsal aspect of the foot without noted aggravating or alleviating factors.     Home Medications Prior to Admission medications   Medication Sig Start Date End Date Taking? Authorizing Provider  doxycycline (VIBRAMYCIN) 100 MG capsule Take 1 capsule (100 mg total) by mouth 2 (two) times daily. 08/08/22  Yes Curatolo, Adam, DO  acamprosate (CAMPRAL) 333 MG tablet Take 2 tablets (666 mg total) by mouth 3 (three) times daily Patient not taking: Reported on 05/13/2022 04/09/22     acetaminophen (TYLENOL) 325 MG tablet Take 1-2 tablets (325-650 mg total) by mouth every 6 (six) hours as needed for mild pain. 07/22/22   Setzer, Edman Circle, PA-C  ARIPiprazole (ABILIFY) 10 MG tablet Take 1 tablet (10 mg total) by mouth daily. 07/23/22   Setzer, Edman Circle, PA-C  ascorbic acid (VITAMIN C) 1000 MG tablet Take 1 tablet (1,000 mg total) by mouth daily. 07/23/22   Setzer, Edman Circle, PA-C  clonazePAM (KLONOPIN) 0.5 MG tablet Take 1 tablet (0.5 mg total) by mouth 2 (two) times daily as needed (anxiety). 07/22/22   Setzer, Edman Circle, PA-C  DULoxetine (CYMBALTA) 60 MG capsule Take 1  capsule (60 mg total) by mouth at bedtime. 07/22/22   Setzer, Edman Circle, PA-C  FLUoxetine (PROZAC) 20 MG capsule Take 1 capsule (20 mg total) by mouth daily. 07/23/22   Setzer, Edman Circle, PA-C  folic acid (FOLVITE) 1 MG tablet Take 1 tablet (1 mg total) by mouth daily. 07/23/22   Setzer, Edman Circle, PA-C  furosemide (LASIX) 40 MG tablet Take 1 tablet (40 mg total) by mouth daily. 07/22/22   Setzer, Edman Circle, PA-C  gabapentin (NEURONTIN) 300 MG capsule Take 2 capsules (600 mg total) by mouth 3 (three) times daily. 07/22/22   Setzer, Edman Circle, PA-C  hydrocortisone (ANUSOL-HC) 2.5 % rectal cream Place rectally 2 (two) times daily as needed for hemorrhoids or anal itching. 07/22/22   Setzer, Edman Circle, PA-C  HYDROmorphone (DILAUDID) 4 MG tablet Take 1 tablet (4 mg total) by mouth 2 (two) times daily as needed for severe pain. 08/08/22   Curatolo, Adam, DO  lactulose (CHRONULAC) 10 GM/15ML solution Take 30 mLs (20 g total) by mouth 2 (two) times daily. 07/22/22   Setzer, Edman Circle, PA-C  levETIRAcetam (KEPPRA) 750 MG tablet Take 1 tablet (750 mg total) by mouth 2 (two) times daily. 07/22/22   Setzer, Edman Circle, PA-C  Multiple Vitamin (MULTIVITAMIN WITH MINERALS) TABS tablet Take 1 tablet by mouth daily. 07/23/22   Setzer, Edman Circle, PA-C  nadolol (CORGARD) 20 MG tablet Take 1 tablet (20 mg total) by mouth every  morning. 07/23/22   Setzer, Edman Circle, PA-C  nutrition supplement, JUVEN, (JUVEN) PACK Take 1 packet by mouth 2 (two) times daily between meals. 07/22/22   Setzer, Edman Circle, PA-C  pantoprazole (PROTONIX) 40 MG tablet Take 1 tablet (40 mg total) by mouth daily. 07/22/22   Setzer, Edman Circle, PA-C  spironolactone (ALDACTONE) 100 MG tablet Take 1 tablet (100 mg total) by mouth daily. 07/23/22   Setzer, Edman Circle, PA-C  thiamine (VITAMIN B-1) 100 MG tablet Take 1 tablet (100 mg total) by mouth daily. 07/23/22   Setzer, Edman Circle, PA-C  traZODone (DESYREL) 50 MG tablet Take 0.5-1 tablets (25-50 mg total) by mouth at  bedtime as needed for sleep. 07/22/22   Setzer, Edman Circle, PA-C      Allergies    Other    Review of Systems   Review of Systems  See HPI  Physical Exam Updated Vital Signs BP 105/78   Pulse 90   Temp (!) 97.3 F (36.3 C) (Oral)   Resp 18   Ht '4\' 11"'$  (1.499 m)   LMP  (LMP Unknown) Comment: MENAPAUSE AT 51 YEARS OLD  SpO2 99%   BMI 28.45 kg/m  Physical Exam Vitals and nursing note reviewed.  Constitutional:      General: She is not in acute distress.    Appearance: She is well-developed.  HENT:     Head: Normocephalic and atraumatic.     Nose: Nose normal.  Eyes:     Conjunctiva/sclera: Conjunctivae normal.  Cardiovascular:     Rate and Rhythm: Regular rhythm. Tachycardia present.     Heart sounds: No murmur heard. Pulmonary:     Effort: Pulmonary effort is normal. No respiratory distress.     Breath sounds: Normal breath sounds.  Abdominal:     Palpations: Abdomen is soft.     Tenderness: There is no abdominal tenderness.  Musculoskeletal:     Cervical back: Neck supple.     Comments: BKA of the right leg with mild surrounding erythema and serous drainage from the incision site.  No active signs of infection.  Left lower extremity with 2+ pitting edema, intact distal pulses and sensation, without signs of trauma.  Skin:    General: Skin is warm and dry.     Capillary Refill: Capillary refill takes less than 2 seconds.  Neurological:     Mental Status: She is alert and oriented to person, place, and time.  Psychiatric:        Mood and Affect: Mood normal.       ED Results / Procedures / Treatments   Labs (all labs ordered are listed, but only abnormal results are displayed) Labs Reviewed  CBC WITH DIFFERENTIAL/PLATELET - Abnormal; Notable for the following components:      Result Value   RDW 17.2 (*)    All other components within normal limits  COMPREHENSIVE METABOLIC PANEL - Abnormal; Notable for the following components:   Potassium 3.0 (*)     Calcium 8.1 (*)    Total Protein 6.4 (*)    Albumin 2.2 (*)    AST 50 (*)    Alkaline Phosphatase 250 (*)    Total Bilirubin 2.2 (*)    All other components within normal limits  LACTIC ACID, PLASMA - Abnormal; Notable for the following components:   Lactic Acid, Venous 2.6 (*)    All other components within normal limits    EKG None  Radiology DG Foot Complete Left  Result Date: 08/08/2022 CLINICAL  DATA:  Left foot pain. EXAM: LEFT FOOT - COMPLETE 3+ VIEW COMPARISON:  Report only from left foot radiographs 04/04/2018 (images unavailable). FINDINGS: Mild hallux valgus. There is diffuse decreased bone mineralization. Mild lateral great toe metatarsophalangeal joint space narrowing and peripheral degenerative spurring. No acute fracture is seen. No dislocation. No cortical erosion. No subcutaneous air. IMPRESSION: Mild great toe metatarsophalangeal joint osteoarthritis. Electronically Signed   By: Yvonne Kendall M.D.   On: 08/08/2022 14:09   DG Tibia/Fibula Right  Result Date: 08/08/2022 CLINICAL DATA:  Right stump pain.  Right below-the-knee amputation. EXAM: RIGHT TIBIA AND FIBULA - 2 VIEW COMPARISON:  None Available. FINDINGS: Postsurgical changes of recent right below-the-knee amputation. There are surgical skin staples overlying the distal stump soft tissues. The proximal tibial and fibular bone amputation resection margins are sharp. Mild expected healing sclerosis/callus formation around the distal tibial diaphysis. No distal stump subcutaneous air. The medial, lateral, patellofemoral knee joint spaces are preserved. No knee joint effusion. IMPRESSION: Postsurgical changes of recent right below-the-knee amputation. The proximal tibial and fibular bone amputation resection margins are sharp. No abnormality is visualized. Electronically Signed   By: Yvonne Kendall M.D.   On: 08/08/2022 14:07    Procedures Procedures    Medications Ordered in ED Medications  acetaminophen (TYLENOL)  tablet 650 mg (650 mg Oral Given 08/08/22 1334)  potassium chloride SA (KLOR-CON M) CR tablet 40 mEq (40 mEq Oral Given 08/08/22 1812)  HYDROmorphone (DILAUDID) injection 1 mg (1 mg Intramuscular Given 08/08/22 1813)  doxycycline (VIBRA-TABS) tablet 100 mg (100 mg Oral Given 08/08/22 1829)    ED Course/ Medical Decision Making/ A&P                           Medical Decision Making  The patient is a 51 year old female with past medical history of EtOH cirrhosis, esophageal varices, and recent right BKA (10/24) who is presenting for evaluation of pain and drainage in her amputation site and left foot pain.  The differential diagnosis considered includes: Postop infection, cellulitis, wound dehiscence, osteomyelitis, trauma.  On initial evaluation, the patient was hemodynamically stable but tachycardic with a heart rate of 113.  The patient's physical exam did not reveal any signs of infection surgical site dehiscence.  The patient's diagnostic workup included a CBC with white blood cell count of 5.7 and hemoglobin of 13.9; CMP with potassium of 3.0, AST elevated to 50, alkaline phosphatase of 250, T. bili 2.2; and lactic acid elevated to 2.6.  The patient also received an x-ray of the right tibia/fibula which did not show any signs of osteomyelitis; and x-ray of the left foot which not show any signs of fracture or malalignment.  Based on the patient's history, normal white blood cell count, absence of fevers, and normal imaging I doubt postop infection at this time.  The patient's pain is favored to be the result of normal postoperative changes.  While in the emergency department, the patient's potassium was repleted and she was given a dose of Dilaudid which resulted in resolution of her tachycardia with improvement in her heart rate to 90.  The patient's orthopedic surgery team was consulted and recommended an empiric course of doxycycline and refill the patient's pain medication with expedited  outpatient follow-up.  The patient was discharged with pain medications and antibiotics as instructed by orthopedic surgery and instructions to follow-up in their clinic for reevaluation.  She was given strict return precautions to the emergency department  for signs of infection.  Amount and/or Complexity of Data Reviewed External Data Reviewed: notes. Labs: ordered. Decision-making details documented in ED Course. Radiology: ordered and independent interpretation performed. Decision-making details documented in ED Course.  Risk Prescription drug management.   Patient's presentation is most consistent with acute complicated illness / injury requiring diagnostic workup.         Final Clinical Impression(s) / ED Diagnoses Final diagnoses:  Wound infection    Rx / DC Orders ED Discharge Orders          Ordered    doxycycline (VIBRAMYCIN) 100 MG capsule  2 times daily        08/08/22 1942    HYDROmorphone (DILAUDID) 4 MG tablet  2 times daily PRN        08/08/22 1942              Dani Gobble, MD 08/08/22 2000    Lennice Sites, DO 08/08/22 2233

## 2022-08-09 ENCOUNTER — Encounter: Payer: Self-pay | Admitting: Oncology

## 2022-08-09 ENCOUNTER — Other Ambulatory Visit (HOSPITAL_COMMUNITY): Payer: Self-pay

## 2022-08-13 ENCOUNTER — Inpatient Hospital Stay: Payer: No Typology Code available for payment source | Admitting: Internal Medicine

## 2022-08-13 ENCOUNTER — Other Ambulatory Visit: Payer: Self-pay

## 2022-08-13 ENCOUNTER — Ambulatory Visit (INDEPENDENT_AMBULATORY_CARE_PROVIDER_SITE_OTHER): Payer: No Typology Code available for payment source | Admitting: Orthopedic Surgery

## 2022-08-13 DIAGNOSIS — S88111A Complete traumatic amputation at level between knee and ankle, right lower leg, initial encounter: Secondary | ICD-10-CM

## 2022-08-13 DIAGNOSIS — Z89511 Acquired absence of right leg below knee: Secondary | ICD-10-CM

## 2022-08-13 NOTE — Progress Notes (Deleted)
[facility]  CHIEF COMPLAINT:    Follow up for MRSA bacteremia  SUBJECTIVE:    Briana Sweeney is a 51 y.o. female with PMHx as below who presents to the clinic for MRSA bacteremia.   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  Follow up blood cultures were no growth and TTE was negative for vegetations.  TEE was not pursued due to hx of decompensated cirrhosis with esophageal varices.  Plan was for 4 weeks of antibiotics from the date of her surgery ending on 07/30/22. She was treated with Daptomycin while inpatient.  She was discharged from CIR on 07/22/22 after receiving a dose of oritavancin that date.  She was not discharged with further antibiotics as this dose provided adequate coverage through 07/30/22.    She was scheduled for follow up with myself on 11/20, but did not keep that appointment and presents today for follow up.  She presented to the ED on 11/30 due to concern for wound infection.  Work up at that time was relatively reassuring.  Her images were discussed with orthopedics whom recommended doxycycline x 10 days. Patient will also follow up with Dr Sharol Given today.     Please see A&P for the details of today's visit and status of the patient's medical problems.   Patient's Medications  New Prescriptions   No medications on file  Previous Medications   ACAMPROSATE (CAMPRAL) 333 MG TABLET    Take 2 tablets (666 mg total) by mouth 3 (three) times daily   ACETAMINOPHEN (TYLENOL) 325 MG TABLET    Take 1-2 tablets (325-650 mg total) by mouth every 6 (six) hours as needed for mild pain.   ARIPIPRAZOLE (ABILIFY) 10 MG TABLET    Take 1 tablet (10 mg total) by mouth daily.   ASCORBIC ACID (VITAMIN C) 1000 MG TABLET    Take 1 tablet (1,000 mg total) by mouth daily.   CLONAZEPAM (KLONOPIN) 0.5 MG TABLET    Take 1 tablet (0.5 mg total) by mouth 2 (two) times daily as needed  (anxiety).   DOXYCYCLINE (VIBRAMYCIN) 100 MG CAPSULE    Take 1 capsule (100 mg total) by mouth 2 (two) times daily for 10 days.   DULOXETINE (CYMBALTA) 60 MG CAPSULE    Take 1 capsule (60 mg total) by mouth at bedtime.   FLUOXETINE (PROZAC) 20 MG CAPSULE    Take 1 capsule (20 mg total) by mouth daily.   FOLIC ACID (FOLVITE) 1 MG TABLET    Take 1 tablet (1 mg total) by mouth daily.   FUROSEMIDE (LASIX) 40 MG TABLET    Take 1 tablet (40 mg total) by mouth daily.   GABAPENTIN (NEURONTIN) 300 MG CAPSULE    Take 2 capsules (600 mg total) by mouth 3 (three) times daily.   HYDROCORTISONE (ANUSOL-HC) 2.5 % RECTAL CREAM    Place rectally 2 (two) times daily as needed for hemorrhoids or anal itching.   HYDROMORPHONE (DILAUDID) 4 MG TABLET    Take 1 tablet (4 mg total) by mouth 2 (two) times daily as needed for severe pain.   LACTULOSE (CHRONULAC) 10 GM/15ML SOLUTION    Take 30 mLs (20 g total) by mouth 2 (two) times daily.   LEVETIRACETAM (KEPPRA) 750 MG TABLET    Take 1 tablet (750 mg total) by mouth 2 (two) times daily.   MULTIPLE VITAMIN (MULTIVITAMIN  WITH MINERALS) TABS TABLET    Take 1 tablet by mouth daily.   NADOLOL (CORGARD) 20 MG TABLET    Take 1 tablet (20 mg total) by mouth every morning.   NUTRITION SUPPLEMENT, JUVEN, (JUVEN) PACK    Take 1 packet by mouth 2 (two) times daily between meals.   PANTOPRAZOLE (PROTONIX) 40 MG TABLET    Take 1 tablet (40 mg total) by mouth daily.   SPIRONOLACTONE (ALDACTONE) 100 MG TABLET    Take 1 tablet (100 mg total) by mouth daily.   THIAMINE (VITAMIN B-1) 100 MG TABLET    Take 1 tablet (100 mg total) by mouth daily.   TRAZODONE (DESYREL) 50 MG TABLET    Take 0.5-1 tablets (25-50 mg total) by mouth at bedtime as needed for sleep.  Modified Medications   No medications on file  Discontinued Medications   No medications on file      Past Medical History:  Diagnosis Date   Abscess of bursa of left elbow 09/23/2018   Acute hyperactive alcohol withdrawal  delirium ([place]) 10/09/2017   Acute low back pain with bilateral sciatica    Acute metabolic encephalopathy 95/05/3266   Acute on chronic pancreatitis ([place]) 04/04/2021   AKI (acute kidney injury) ([place]) 04/04/2021   Alcohol withdrawal syndrome with complication ([place]) 12/45/8099   Alcohol-induced acute pancreatitis    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[place]) 12/05/2018   Ascites due to alcoholic cirrhosis ([place])    Bipolar affective disorder ([place])    With anxiety features   Bunionette of right foot 11/2019   Cirrhosis of liver ([place])    Due to alcohol and hepatitis C   Cocaine dependence without complication ([place]) 83/38/2505   Decompensated hepatic cirrhosis ([place]) 07/23/2017   Depression with anxiety    Epistaxis 01/20/2019   Erysipelas 09/13/2018   Esophageal 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 ([place])    Esophageal varices without bleeding (HCC)    ETOHism (HCC)    GERD (gastroesophageal reflux disease)    Head injury    Hematemesis 02/10/2018   Hepatitis C 2018   hepatitis c and alcohol related hepatitis   Heroin abuse ([place])    History of blood transfusion    "blood doesn't clot; I fell down and had to have a transfusion"   History of kidney stones    Menopause 2016   Migraine    "when I get really stressed" (09/01/2017)   Neuropathy 10/27/2018   Olecranon bursitis of left elbow    Other mixed anxiety disorders 10/27/2018   Overdose 08/22/2018   Pancytopenia ([place]) 07/23/2017   Polysubstance abuse ([place]) 04/08/2018   Portal hypertension ([place]) 03/18/2018   S/P foot surgery, right 11/2019   Schizophrenia ([place])    Seizures ([place])    "when I run out of my RX; lots recently" (09/01/2017)   Suicidal ideation    Thrombocytopenia ([place])    Trichimoniasis 10/30/2017   Upper GI bleed 02/10/2018   Urinary urgency 05/2020   UTI (urinary tract infection) 06/02/2017    Social History   Tobacco Use   Smoking status: Never   Smokeless tobacco: Never  Vaping Use   Vaping Use: Never used  Substance Use  Topics   Alcohol use: Yes    Alcohol/week: 6.0 standard drinks of alcohol    Types: 6 Cans of beer per week    Comment: pt states she had 1 can beer on 02/26/22   Drug use: Not Currently    Family History  Problem Relation Age of Onset   Lung  cancer Mother 74   Alcohol abuse Mother    Throat cancer Father 57    Allergies  Allergen Reactions   Other Other (See Comments)    Platelets: Rx chest pain, tremors, body aches  Patient stated that she receives platelet injection every Wednesday     ROS   OBJECTIVE:    There were no vitals filed for this visit. There is no height or weight on file to calculate BMI.  Physical Exam   Labs and Microbiology:    Latest Ref Rng & Units 08/08/2022    1:28 PM 07/29/2022   11:18 AM 07/22/2022    4:04 AM  CBC  WBC 4.0 - 10.5 K/uL 5.7  5.1  3.3   Hemoglobin 12.0 - 15.0 g/dL 13.9  13.7  11.8   Hematocrit 36.0 - 46.0 % 44.0  41.0  36.2   Platelets 150 - 400 K/uL 165  44  49       Latest Ref Rng & Units 08/08/2022    1:28 PM 07/22/2022    4:04 AM 07/17/2022    2:55 AM  CMP  Glucose 70 - 99 mg/dL 83  92  108   BUN 6 - 20 mg/dL '6  5  9   '$ Creatinine 0.44 - 1.00 mg/dL 0.55  0.52  0.50   Sodium 135 - 145 mmol/L 135  130  133   Potassium 3.5 - 5.1 mmol/L 3.0  3.6  4.1   Chloride 98 - 111 mmol/L 99  97  99   CO2 22 - 32 mmol/L '24  26  23   '$ Calcium 8.9 - 10.3 mg/dL 8.1  7.7  8.2   Total Protein 6.5 - 8.1 g/dL 6.4     Total Bilirubin 0.3 - 1.2 mg/dL 2.2     Alkaline Phos 38 - 126 U/L 250     AST 15 - 41 U/L 50     ALT 0 - 44 U/L 24        No results found for this or any previous visit (from the past 240 hour(s)).  Imaging: ***   ASSESSMENT & PLAN:    No problem-specific Assessment & Plan notes found for this encounter.   No orders of the defined types were placed in this encounter.    There are no diagnoses linked to this encounter.  Patient has completed her IV antibiotic course for right foot OM complicated by MRSA  bacteremia.  She underwent definitive source control with BKA and then completed 4 weeks of therapy from date of her surgery.  No current concerns for relapsed infection at this time.  Noted to have some purulent drainage and redness at incision during ED visit on 11/30.  She is completing a course of doxycycline x 10 days and reports ***.  She also has follow up with orthopedic surgery today.  ***.   [facility] 08/13/2022, 4:41 AM

## 2022-08-14 ENCOUNTER — Inpatient Hospital Stay: Payer: No Typology Code available for payment source | Attending: Hematology & Oncology

## 2022-08-14 ENCOUNTER — Encounter: Payer: Self-pay | Admitting: Oncology

## 2022-08-14 ENCOUNTER — Inpatient Hospital Stay: Payer: No Typology Code available for payment source | Admitting: Physical Medicine and Rehabilitation

## 2022-08-14 DIAGNOSIS — R161 Splenomegaly, not elsewhere classified: Secondary | ICD-10-CM | POA: Insufficient documentation

## 2022-08-14 DIAGNOSIS — D6959 Other secondary thrombocytopenia: Secondary | ICD-10-CM | POA: Insufficient documentation

## 2022-08-14 DIAGNOSIS — K7030 Alcoholic cirrhosis of liver without ascites: Secondary | ICD-10-CM | POA: Diagnosis not present

## 2022-08-14 DIAGNOSIS — D693 Immune thrombocytopenic purpura: Secondary | ICD-10-CM | POA: Diagnosis present

## 2022-08-14 LAB — CBC WITH DIFFERENTIAL (CANCER CENTER ONLY)
Abs Immature Granulocytes: 0.01 10*3/uL (ref 0.00–0.07)
Basophils Absolute: 0 10*3/uL (ref 0.0–0.1)
Basophils Relative: 0 %
Eosinophils Absolute: 0 10*3/uL (ref 0.0–0.5)
Eosinophils Relative: 1 %
HCT: 43.6 % (ref 36.0–46.0)
Hemoglobin: 14.3 g/dL (ref 12.0–15.0)
Immature Granulocytes: 0 %
Lymphocytes Relative: 23 %
Lymphs Abs: 1.4 10*3/uL (ref 0.7–4.0)
MCH: 29.9 pg (ref 26.0–34.0)
MCHC: 32.8 g/dL (ref 30.0–36.0)
MCV: 91 fL (ref 80.0–100.0)
Monocytes Absolute: 0.7 10*3/uL (ref 0.1–1.0)
Monocytes Relative: 12 %
Neutro Abs: 3.7 10*3/uL (ref 1.7–7.7)
Neutrophils Relative %: 64 %
Platelet Count: 285 10*3/uL (ref 150–400)
RBC: 4.79 MIL/uL (ref 3.87–5.11)
RDW: 16.3 % — ABNORMAL HIGH (ref 11.5–15.5)
WBC Count: 5.8 10*3/uL (ref 4.0–10.5)
nRBC: 0 % (ref 0.0–0.2)

## 2022-08-15 ENCOUNTER — Inpatient Hospital Stay: Payer: No Typology Code available for payment source

## 2022-08-15 ENCOUNTER — Other Ambulatory Visit (HOSPITAL_COMMUNITY): Payer: Self-pay

## 2022-08-15 VITALS — BP 113/74 | HR 78 | Resp 20 | Ht 59.0 in | Wt 134.8 lb

## 2022-08-15 DIAGNOSIS — D696 Thrombocytopenia, unspecified: Secondary | ICD-10-CM

## 2022-08-15 DIAGNOSIS — D693 Immune thrombocytopenic purpura: Secondary | ICD-10-CM | POA: Diagnosis not present

## 2022-08-15 MED ORDER — FLUOXETINE HCL 20 MG PO CAPS
60.0000 mg | ORAL_CAPSULE | Freq: Every morning | ORAL | 2 refills | Status: DC
  Filled 2022-08-15: qty 90, 30d supply, fill #0

## 2022-08-15 MED ORDER — CLONAZEPAM 0.5 MG PO TABS
0.5000 mg | ORAL_TABLET | Freq: Two times a day (BID) | ORAL | 2 refills | Status: DC
  Filled 2022-08-15: qty 60, 30d supply, fill #0

## 2022-08-15 MED ORDER — ARIPIPRAZOLE 15 MG PO TABS
15.0000 mg | ORAL_TABLET | Freq: Every day | ORAL | 2 refills | Status: DC
  Filled 2022-08-15: qty 30, 30d supply, fill #0

## 2022-08-15 MED ORDER — ROMIPLOSTIM 250 MCG ~~LOC~~ SOLR
6.1000 ug/kg | Freq: Once | SUBCUTANEOUS | Status: AC
  Administered 2022-08-15: 375 ug via SUBCUTANEOUS
  Filled 2022-08-15: qty 0.5

## 2022-08-15 MED ORDER — TRAZODONE HCL 50 MG PO TABS
50.0000 mg | ORAL_TABLET | Freq: Every evening | ORAL | 2 refills | Status: DC | PRN
  Filled 2022-08-15: qty 30, 30d supply, fill #0

## 2022-08-15 MED FILL — Romiplostim For Inj 250 MCG: SUBCUTANEOUS | Qty: 0.75 | Status: AC

## 2022-08-15 NOTE — Patient Instructions (Signed)
Romiplostim Injection What is this medication? ROMIPLOSTIM (roe mi PLOE stim) treats low levels of platelets in your body caused by immune thrombocytopenia (ITP). It is prescribed when other medications have not worked or cannot be tolerated. It may also be used to help people who have been exposed to high doses of radiation. It works by increasing the amount of platelets in your blood. This lowers the risk of bleeding. This medicine may be used for other purposes; ask your health care provider or pharmacist if you have questions. COMMON BRAND NAME(S): Nplate What should I tell my care team before I take this medication? They need to know if you have any of these conditions: Blood clots Myelodysplastic syndrome An unusual or allergic reaction to romiplostim, mannitol, other medications, foods, dyes, or preservatives Pregnant or trying to get pregnant Breast-feeding How should I use this medication? This medication is injected under the skin. It is given by a care team in a hospital or clinic setting. A special MedGuide will be given to you before each treatment. Be sure to read this information carefully each time. Talk to your care team about the use of this medication in children. While it may be prescribed for children as young as newborns for selected conditions, precautions do apply. Overdosage: If you think you have taken too much of this medicine contact a poison control center or emergency room at once. NOTE: This medicine is only for you. Do not share this medicine with others. What if I miss a dose? Keep appointments for follow-up doses. It is important not to miss your dose. Call your care team if you are unable to keep an appointment. What may interact with this medication? Interactions are not expected. This list may not describe all possible interactions. Give your health care provider a list of all the medicines, herbs, non-prescription drugs, or dietary supplements you use. Also  tell them if you smoke, drink alcohol, or use illegal drugs. Some items may interact with your medicine. What should I watch for while using this medication? Visit your care team for regular checks on your progress. You may need blood work done while you are taking this medication. Your condition will be monitored carefully while you are receiving this medication. It is important not to miss any appointments. What side effects may I notice from receiving this medication? Side effects that you should report to your care team as soon as possible: Allergic reactions--skin rash, itching, hives, swelling of the face, lips, tongue, or throat Blood clot--pain, swelling, or warmth in the leg, shortness of breath, chest pain Side effects that usually do not require medical attention (report to your care team if they continue or are bothersome): Dizziness Joint pain Muscle pain Pain in the hands or feet Stomach pain Trouble sleeping This list may not describe all possible side effects. Call your doctor for medical advice about side effects. You may report side effects to FDA at 1-800-FDA-1088. Where should I keep my medication? This medication is given in a hospital or clinic. It will not be stored at home. NOTE: This sheet is a summary. It may not cover all possible information. If you have questions about this medicine, talk to your doctor, pharmacist, or health care provider.  2023 Elsevier/Gold Standard (2021-12-04 00:00:00)  

## 2022-08-18 NOTE — Progress Notes (Signed)
[facility]  [address] [place],  [place]  [address] [phone]  Clinic Day:  08/19/2022  Referring physician: Marco Collie, MD  HISTORY OF PRESENT ILLNESS:  The patient is a 51 y.o. 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  She has chronically been receiving weekly Nplate to keep her platelets at a suitable level.  The patient comes in today to reassess her thrombocytopenia.  Since her last visit, the patient had to undergo a right below-knee amputation due a wound and infection that were not improving with supportive therapy.  Despite this unfortunate surgery, the patient claims to be doing okay emotionally.  She is scheduled to receive a prosthesis in the forthcoming weeks.  As it pertains to her thrombocytopenia, she denies having any severe bleeding/bruising issues.  Of note, the patient says she has not consumed any alcohol in nearly multiple months.    PHYSICAL EXAM:  Blood pressure 129/86, pulse (!) 113, temperature 97.6 F (36.4 C), resp. rate 16, height '4\' 11"'$  (1.499 m), SpO2 96 %. Wt Readings from Last 3 Encounters:  08/15/22 134 lb 12 oz (61.1 kg)  07/22/22 140 lb 14 oz (63.9 kg)  07/05/22 147 lb 11.3 oz (67 kg)   Body mass index is 27.22 kg/m.  Performance status (ECOG): 1 - Symptomatic but completely ambulatory  Physical Exam Vitals and nursing note reviewed.  Constitutional:      General: She is not in acute distress.    Appearance: Normal appearance.     Comments: She is in a wheelchair.  HENT:     Head: Normocephalic.     Mouth/Throat:     Mouth: Mucous membranes are moist.     Pharynx: Oropharynx is clear. No oropharyngeal exudate or posterior oropharyngeal erythema.  Eyes:     General: No scleral icterus.    Extraocular Movements: Extraocular movements intact.     Conjunctiva/sclera: Conjunctivae normal.     Pupils: Pupils are equal, round, and reactive to light.   Cardiovascular:     Rate and Rhythm: Normal rate and regular rhythm.     Heart sounds: Normal heart sounds. No murmur heard.    No friction rub. No gallop.  Pulmonary:     Effort: Pulmonary effort is normal.     Breath sounds: Normal breath sounds. No wheezing, rhonchi or rales.  Abdominal:     General: There is no distension.     Palpations: Abdomen is soft. There is fluid wave. There is no hepatomegaly, splenomegaly or mass.     Tenderness: There is no abdominal tenderness.  Musculoskeletal:        General: Normal range of motion.     Cervical back: Normal range of motion and neck supple.     Right lower leg: No edema.     Left lower leg: No edema.  Lymphadenopathy:     Cervical: No cervical adenopathy.     Upper Body:     Right upper body: No supraclavicular or axillary adenopathy.     Left upper body: No supraclavicular or axillary adenopathy.     Lower Body: No right inguinal adenopathy. No left inguinal adenopathy.  Skin:    General: Skin is warm and dry.     Coloration: Skin is not jaundiced.     Findings: No rash.  Neurological:     Mental Status: She is alert and oriented to person, place, and time.     Cranial Nerves: No cranial nerve  deficit.  Psychiatric:        Mood and Affect: Mood normal.        Behavior: Behavior normal.        Thought Content: Thought content normal.    LABS:    Latest Reference Range & Units 08/19/22 00:00  WBC  6.4 (E)  RBC 3.87 - 5.11  4.50 (E)  Hemoglobin 12.0 - 16.0  13.6 (E)  HCT 36 - 46  41 (E)  Platelets 150 - 400 K/uL 103 ! (E)  !: Data is abnormal (E): External lab result  ASSESSMENT & PLAN:  Assessment/Plan:  A 51 y.o. 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  Although lower, she still has more than enough platelets to do the clotting her body needs.  For now, she will continue receiving weekly Nplate. I will see her back in 4 months for repeat clinical assessment.   The patient understands all the plans discussed today and is in agreement with them.    Christe Tellez Macarthur Critchley, MD

## 2022-08-19 ENCOUNTER — Other Ambulatory Visit: Payer: Self-pay | Admitting: Oncology

## 2022-08-19 ENCOUNTER — Inpatient Hospital Stay (INDEPENDENT_AMBULATORY_CARE_PROVIDER_SITE_OTHER): Payer: No Typology Code available for payment source | Admitting: Oncology

## 2022-08-19 ENCOUNTER — Inpatient Hospital Stay: Payer: No Typology Code available for payment source

## 2022-08-19 VITALS — BP 129/86 | HR 113 | Temp 97.6°F | Resp 16 | Ht 59.0 in

## 2022-08-19 DIAGNOSIS — D696 Thrombocytopenia, unspecified: Secondary | ICD-10-CM | POA: Diagnosis not present

## 2022-08-19 DIAGNOSIS — D693 Immune thrombocytopenic purpura: Secondary | ICD-10-CM

## 2022-08-19 LAB — CBC AND DIFFERENTIAL
HCT: 41 (ref 36–46)
Hemoglobin: 13.6 (ref 12.0–16.0)
Neutrophils Absolute: 4.42
Platelets: 103 K/uL — AB (ref 150–400)
WBC: 6.4

## 2022-08-19 LAB — CBC: RBC: 4.5 (ref 3.87–5.11)

## 2022-08-20 ENCOUNTER — Encounter: Payer: Self-pay | Admitting: Orthopedic Surgery

## 2022-08-20 ENCOUNTER — Ambulatory Visit: Payer: Medicaid Other | Admitting: Plastic Surgery

## 2022-08-20 NOTE — Progress Notes (Signed)
Office Visit Note   Patient: Briana Sweeney           Date of Birth: October 21, 1970           MRN: [id number] Visit Date: 08/13/2022              Requested by: Marice Potter, MD [place],  [place] [address] PCP: Marice Potter, MD  Chief Complaint  Patient presents with   Right Leg - Routine Post Op    07/02/2022 right KA with Kerecis    Left Leg - Edema      HPI: Patient is a 51 year old woman who is status post right transtibial amputation with Kerecis tissue reinforcement on October 24.  Patient has been provided prescription for prosthesis.  Patient is currently on doxycycline twice a day Lasix 40 mg a day and Aldactone 100 mg a day.   Assessment & Plan: Visit Diagnoses:  1. Below-knee amputation of right lower extremity ([place])     Plan: Patient is provided a prescription for [employer] for a stump shrinker on the right and a compression sock on the left.  Follow-Up Instructions: Return in about 4 weeks (around 09/10/2022).   Ortho Exam  Patient is alert, oriented, no adenopathy, well-dressed, normal affect, normal respiratory effort. Examination the right below-knee amputation has some swelling with small amount of wound breakdown.  There is no cellulitis odor or drainage.  Left lower extremity has pitting edema with 2 small venous stasis ulcers.  Imaging: No results found.    Labs: Lab Results  Component Value Date   HGBA1C 4.9 05/26/2020   HGBA1C 4.9 05/26/2020   HGBA1C 4.9 (A) 05/26/2020   HGBA1C 4.9 05/26/2020   REPTSTATUS 07/07/2022 FINAL 07/02/2022   GRAMSTAIN  05/14/2022    NO WBC SEEN NO ORGANISMS SEEN Performed at [facility], [place] [address] [place], [place] [address]    CULT  07/02/2022    NO GROWTH 5 DAYS Performed at [facility] [address] [place], [place] [address]    LABORGA METHICILLIN RESISTANT STAPHYLOCOCCUS AUREUS 06/29/2022     Lab Results  Component Value Date   ALBUMIN 2.2 (L) 08/08/2022    ALBUMIN 1.8 (L) 07/06/2022   ALBUMIN 1.9 (L) 07/05/2022   PREALBUMIN <5 (L) 01/23/2022    Lab Results  Component Value Date   MG 1.5 (L) 07/06/2022   MG 1.8 05/15/2022   MG 1.5 (L) 05/13/2022   Lab Results  Component Value Date   VD25OH 55.7 12/13/2019   VD25OH 38.8 05/05/2019    Lab Results  Component Value Date   PREALBUMIN <5 (L) 01/23/2022      Latest Ref Rng & Units 08/19/2022   12:00 AM 08/14/2022    1:06 PM 08/08/2022    1:28 PM  CBC EXTENDED  WBC  6.4     5.8  5.7   RBC 3.87 - 5.11 4.50     4.79  4.70   Hemoglobin 12.0 - 16.0 13.6     14.3  13.9   HCT 36 - 46 41     43.6  44.0   Platelets 150 - 400 K/uL 103     285  165   NEUT#  4.42     3.7  3.4   Lymph# 0.7 - 4.0 K/uL  1.4  1.7      This result is from an external source.     There is no height or weight on  file to calculate BMI.  Orders:  No orders of the defined types were placed in this encounter.  No orders of the defined types were placed in this encounter.    Procedures: No procedures performed  Clinical Data: No additional findings.  ROS:  All other systems negative, except as noted in the HPI. Review of Systems  Objective: Vital Signs: LMP  (LMP Unknown) Comment: MENAPAUSE AT 51 YEARS OLD  Specialty Comments:  No specialty comments available.  PMFS History: Patient Active Problem List   Diagnosis Date Noted   Hx of BKA, right ([place]) 07/15/2022   Osteomyelitis of right foot ([place]) 07/05/2022   Cutaneous abscess of right foot 07/02/2022   MRSA bacteremia    Foot osteomyelitis, right ([place]) 06/30/2022   Sepsis ([place]) 06/29/2022   Portal vein thrombosis 05/15/2022   History of ITP 05/15/2022   GERD without esophagitis 05/14/2022   C. difficile colitis 78/46/9629   ALC (alcoholic liver cirrhosis) ([place]) 52/84/1324   Alcoholic cirrhosis of liver with ascites ([place]) 02/27/2022   Ascites 01/31/2022   Hyperkalemia 01/30/2022   Hepatic cirrhosis ([place]) 01/23/2022   Liver failure  without hepatic coma ([place]) 01/22/2022   Hypophosphatemia 01/22/2022   Severe neutropenia ([place]) 12/16/2021   Cellulitis of right foot 12/16/2021   Severe protein-calorie malnutrition ([place]) 12/15/2021   Anasarca 11/01/2021   Acute posthemorrhagic anemia    Esophageal 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 ([place])    Abdominal pain 10/31/2021   Palpitations 10/11/2021   Bradycardia 07/23/2021   Secondary esophageal varices with bleeding ([place])    Alcohol withdrawal ([place]) 06/26/2021   Acute renal failure superimposed on stage 3a chronic kidney disease ([place]) 04/04/2021   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HCC) 12/18/2020   Chronic anemia 10/02/2020   Hyponatremia 04/02/2019   Anxiety and depression 10/27/2018   Neuropathy 10/27/2018   Substance induced mood disorder ([place]) 07/17/2018   Encephalopathy, portal systemic 04/08/2018   Seizure disorder ([place]) 03/18/2018   Hepatitis C 03/18/2018   Portal hypertension ([place]) 03/18/2018   Depression 03/18/2018   Thrombocytopenia ([place]) 01/02/2018   GERD (gastroesophageal reflux disease) 11/01/2017   Alcohol use disorder, severe, dependence ([place]) 10/29/2017   Esophageal varices without bleeding ([place])    Abdominal ascites    Coagulopathy ([place]) 07/23/2017   Pancytopenia ([place]) 07/23/2017   Past Medical History:  Diagnosis Date   Abscess of bursa of left elbow 09/23/2018   Acute hyperactive alcohol withdrawal delirium ([place]) 10/09/2017   Acute low back pain with bilateral sciatica    Acute metabolic encephalopathy 40/06/2724   Acute on chronic pancreatitis ([place]) 04/04/2021   AKI (acute kidney injury) ([place]) 04/04/2021   Alcohol withdrawal syndrome with complication ([place]) 36/64/4034   Alcohol-induced acute pancreatitis    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[place]) 12/05/2018   Ascites due to alcoholic cirrhosis (HCC)    Bipolar affective disorder ([place])    With anxiety features   Bunionette of right foot 11/2019   Cirrhosis of liver ([place])    Due to alcohol and  hepatitis C   Cocaine dependence without complication ([place]) 74/25/9563   Decompensated hepatic cirrhosis ([place]) 07/23/2017   Depression with anxiety    Epistaxis 01/20/2019   Erysipelas 09/13/2018   Esophageal 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 (HCC)    Esophageal varices without bleeding (HCC)    ETOHism (HCC)    GERD (gastroesophageal reflux disease)    Head injury    Hematemesis 02/10/2018   Hepatitis C 2018   hepatitis c and alcohol related hepatitis   Heroin abuse ([place])  History of blood transfusion    "blood doesn't clot; I fell down and had to have a transfusion"   History of kidney stones    Menopause 2016   Migraine    "when I get really stressed" (09/01/2017)   Neuropathy 10/27/2018   Olecranon bursitis of left elbow    Other mixed anxiety disorders 10/27/2018   Overdose 08/22/2018   Pancytopenia ([place]) 07/23/2017   Polysubstance abuse ([place]) 04/08/2018   Portal hypertension ([place]) 03/18/2018   S/P foot surgery, right 11/2019   Schizophrenia ([place])    Seizures ([place])    "when I run out of my RX; lots recently" (09/01/2017)   Suicidal ideation    Thrombocytopenia ([place])    Trichimoniasis 10/30/2017   Upper GI bleed 02/10/2018   Urinary urgency 05/2020   UTI (urinary tract infection) 06/02/2017    Family History  Problem Relation Age of Onset   Lung cancer Mother 21   Alcohol abuse Mother    Throat cancer Father 53    Past Surgical History:  Procedure Laterality Date   AMPUTATION Right 07/02/2022   Procedure: RIGHT BELOW KNEE AMPUTATION;  Surgeon: Newt Minion, MD;  Location: [facility];  Service: Orthopedics;  Laterality: Right;   APPLICATION OF WOUND VAC Right 07/02/2022   Procedure: APPLICATION OF WOUND VAC;  Surgeon: Newt Minion, MD;  Location: [facility];  Service: Orthopedics;  Laterality: Right;   BUNIONECTOMY     ESOPHAGEAL BANDING  06/26/2021   Procedure: ESOPHAGEAL BANDING;  Surgeon: Jackquline Denmark, MD;  [facility] ENDOSCOPY;  Service: Endoscopy;;    ESOPHAGOGASTRODUODENOSCOPY N/A 09/03/2017   Procedure: ESOPHAGOGASTRODUODENOSCOPY (EGD);  Surgeon: Doran Stabler, MD;  Location: [facility];  Service: Gastroenterology;  Laterality: N/A;   ESOPHAGOGASTRODUODENOSCOPY N/A 02/01/2022   Procedure: ESOPHAGOGASTRODUODENOSCOPY (EGD);  Surgeon: Clarene Essex, MD;  Location: [facility] ENDOS[facility];  Service: Gastroenterology;  Laterality: N/A;   ESOPHAGOGASTRODUODENOSCOPY (EGD) WITH PROPOFOL N/A 06/26/2021   Procedure: ESOPHAGOGASTRODUODENOSCOPY (EGD) WITH PROPOFOL;  Surgeon: Jackquline Denmark, MD;  Location: [facility] ENDOSCOPY;  Service: Endoscopy;  Laterality: N/A;   FINGER FRACTURE SURGERY Left    "shattered my pinky"   FRACTURE SURGERY     I & D EXTREMITY Left 09/18/2018   Procedure: IRRIGATION AND DEBRIDEMENT EXTREMITY;  Surgeon: Leanora Cover, MD;  Location: [facility];  Service: Orthopedics;  Laterality: Left;   IR PARACENTESIS  07/23/2017   IR PARACENTESIS  07/2017   "did it twice in the same week" (09/01/2017)   IR PARACENTESIS  06/26/2021   IR PARACENTESIS  07/10/2022   SHOULDER OPEN ROTATOR CUFF REPAIR Right    TOOTH EXTRACTION  08/2019   TUBAL LIGATION     VAGINAL HYSTERECTOMY     Social History   Occupational History   Occupation: applying for disability  Tobacco Use   Smoking status: Never   Smokeless tobacco: Never  Vaping Use   Vaping Use: Never used  Substance and Sexual Activity   Alcohol use: Yes    Alcohol/week: 6.0 standard drinks of alcohol    Types: 6 Cans of beer per week    Comment: pt states she had 1 can beer on 02/26/22   Drug use: Not Currently   Sexual activity: Not on file

## 2022-08-21 ENCOUNTER — Ambulatory Visit: Payer: Commercial Managed Care - HMO

## 2022-08-22 NOTE — Progress Notes (Deleted)
**  Late Entry**   07/22/22 1058  What Happened  Was fall witnessed? Yes  Who witnessed fall? RN  Patients activity before fall bathroom-unassisted  Point of contact hip/leg  Was patient injured? Yes  Follow Up  Family notified No - patient refusal  Simple treatment Dressing  Progress note created (see row info) Yes  Adult Fall Risk Assessment  Risk Factor Category (scoring not indicated) High fall risk per protocol (document High fall risk)  Age 51  Fall History: Fall within 6 months prior to admission 0  Elimination; Bowel and/or Urine Incontinence 0  Elimination; Bowel and/or Urine Urgency/Frequency 0  Medications: includes PCA/Opiates, Anti-convulsants, Anti-hypertensives, Diuretics, Hypnotics, Laxatives, Sedatives, and Psychotropics 5  Patient Care Equipment 1  Mobility-Assistance 2  Mobility-Gait 2  Mobility-Sensory Deficit 0  Altered awareness of immediate physical environment 0  Impulsiveness 0  Lack of understanding of one's physical/cognitive limitations 0  Total Score 10  Patient Fall Risk Level Moderate fall risk  Adult Fall Risk Interventions  Required Bundle Interventions *See Row Information* Moderate fall risk - low and moderate requirements implemented  Additional Interventions Use of appropriate toileting equipment (bedpan, BSC, etc.)  Screening for Fall Injury Risk (To be completed on HIGH fall risk patients) - Assessing Need for Floor Mats  Risk For Fall Injury- Criteria for Floor Mats None identified - No additional interventions needed  Vitals  Temp 98.2 F (36.8 C)  BP 94/64  MAP (mmHg) 75  BP Location Left Arm  BP Method Automatic  Patient Position (if appropriate) Lying  Pulse Rate 86  Pulse Rate Source Monitor  Resp 18  Oxygen Therapy  SpO2 94 %  O2 Device Room Air  Pain Assessment  Pain Scale 0-10  Pain Score 3  Pain Type Acute pain  Pain Location Leg  Pain Orientation Right  Pain Intervention(s) Repositioned  Neurological  Neuro (WDL)  WDL  Level of Consciousness Alert  Orientation Level Oriented X4  Cognition Follows commands  RUE Motor Response Purposeful movement  LUE Motor Response Purposeful movement  RLE Motor Response Purposeful movement  LLE Motor Response Purposeful movement  Integumentary  Integumentary (WDL) WDL  Skin Color Appropriate for ethnicity

## 2022-08-23 NOTE — Telephone Encounter (Signed)
Patient aware of Dr.Kirsteins reply.

## 2022-08-24 ENCOUNTER — Other Ambulatory Visit (HOSPITAL_COMMUNITY): Payer: Self-pay

## 2022-08-24 ENCOUNTER — Other Ambulatory Visit: Payer: Self-pay | Admitting: Physician Assistant

## 2022-08-25 ENCOUNTER — Encounter: Payer: Self-pay | Admitting: Oncology

## 2022-08-26 ENCOUNTER — Inpatient Hospital Stay: Payer: No Typology Code available for payment source

## 2022-08-26 ENCOUNTER — Other Ambulatory Visit (HOSPITAL_COMMUNITY): Payer: Self-pay

## 2022-08-26 MED ORDER — DULOXETINE HCL 60 MG PO CPEP
60.0000 mg | ORAL_CAPSULE | Freq: Every day | ORAL | 0 refills | Status: DC
  Filled 2022-08-26: qty 30, 30d supply, fill #0

## 2022-08-28 ENCOUNTER — Inpatient Hospital Stay: Payer: No Typology Code available for payment source

## 2022-09-02 ENCOUNTER — Emergency Department (HOSPITAL_COMMUNITY): Payer: No Typology Code available for payment source

## 2022-09-02 ENCOUNTER — Inpatient Hospital Stay (HOSPITAL_COMMUNITY)
Admission: EM | Admit: 2022-09-02 | Discharge: 2022-09-11 | DRG: 602 | Disposition: A | Discharge status: Hospice | Payer: No Typology Code available for payment source | Attending: Infectious Diseases | Admitting: Infectious Diseases

## 2022-09-02 ENCOUNTER — Other Ambulatory Visit: Payer: Self-pay

## 2022-09-02 DIAGNOSIS — Z888 Allergy status to other drugs, medicaments and biological substances status: Secondary | ICD-10-CM

## 2022-09-02 DIAGNOSIS — E8809 Other disorders of plasma-protein metabolism, not elsewhere classified: Secondary | ICD-10-CM | POA: Diagnosis present

## 2022-09-02 DIAGNOSIS — L03119 Cellulitis of unspecified part of limb: Principal | ICD-10-CM

## 2022-09-02 DIAGNOSIS — F05 Delirium due to known physiological condition: Secondary | ICD-10-CM | POA: Diagnosis present

## 2022-09-02 DIAGNOSIS — F1010 Alcohol abuse, uncomplicated: Secondary | ICD-10-CM | POA: Diagnosis present

## 2022-09-02 DIAGNOSIS — Z79899 Other long term (current) drug therapy: Secondary | ICD-10-CM

## 2022-09-02 DIAGNOSIS — F1110 Opioid abuse, uncomplicated: Secondary | ICD-10-CM | POA: Diagnosis present

## 2022-09-02 DIAGNOSIS — F1090 Alcohol use, unspecified, uncomplicated: Secondary | ICD-10-CM | POA: Diagnosis not present

## 2022-09-02 DIAGNOSIS — K7682 Hepatic encephalopathy: Secondary | ICD-10-CM | POA: Diagnosis present

## 2022-09-02 DIAGNOSIS — N183 Chronic kidney disease, stage 3 unspecified: Secondary | ICD-10-CM | POA: Diagnosis present

## 2022-09-02 DIAGNOSIS — K767 Hepatorenal syndrome: Secondary | ICD-10-CM | POA: Diagnosis present

## 2022-09-02 DIAGNOSIS — D696 Thrombocytopenia, unspecified: Secondary | ICD-10-CM | POA: Diagnosis present

## 2022-09-02 DIAGNOSIS — R188 Other ascites: Secondary | ICD-10-CM | POA: Diagnosis present

## 2022-09-02 DIAGNOSIS — F419 Anxiety disorder, unspecified: Secondary | ICD-10-CM | POA: Diagnosis present

## 2022-09-02 DIAGNOSIS — L538 Other specified erythematous conditions: Secondary | ICD-10-CM

## 2022-09-02 DIAGNOSIS — Z993 Dependence on wheelchair: Secondary | ICD-10-CM

## 2022-09-02 DIAGNOSIS — Z1152 Encounter for screening for COVID-19: Secondary | ICD-10-CM | POA: Diagnosis not present

## 2022-09-02 DIAGNOSIS — R34 Anuria and oliguria: Secondary | ICD-10-CM | POA: Diagnosis present

## 2022-09-02 DIAGNOSIS — M7989 Other specified soft tissue disorders: Secondary | ICD-10-CM

## 2022-09-02 DIAGNOSIS — Z808 Family history of malignant neoplasm of other organs or systems: Secondary | ICD-10-CM

## 2022-09-02 DIAGNOSIS — E162 Hypoglycemia, unspecified: Secondary | ICD-10-CM | POA: Diagnosis present

## 2022-09-02 DIAGNOSIS — I959 Hypotension, unspecified: Secondary | ICD-10-CM | POA: Diagnosis present

## 2022-09-02 DIAGNOSIS — N179 Acute kidney failure, unspecified: Secondary | ICD-10-CM | POA: Diagnosis present

## 2022-09-02 DIAGNOSIS — Z66 Do not resuscitate: Secondary | ICD-10-CM | POA: Diagnosis present

## 2022-09-02 DIAGNOSIS — M868X6 Other osteomyelitis, lower leg: Secondary | ICD-10-CM | POA: Diagnosis present

## 2022-09-02 DIAGNOSIS — R52 Pain, unspecified: Secondary | ICD-10-CM | POA: Diagnosis not present

## 2022-09-02 DIAGNOSIS — B1920 Unspecified viral hepatitis C without hepatic coma: Secondary | ICD-10-CM | POA: Diagnosis present

## 2022-09-02 DIAGNOSIS — L03115 Cellulitis of right lower limb: Secondary | ICD-10-CM | POA: Insufficient documentation

## 2022-09-02 DIAGNOSIS — K7210 Chronic hepatic failure without coma: Secondary | ICD-10-CM | POA: Diagnosis present

## 2022-09-02 DIAGNOSIS — E876 Hypokalemia: Secondary | ICD-10-CM | POA: Diagnosis present

## 2022-09-02 DIAGNOSIS — K766 Portal hypertension: Secondary | ICD-10-CM | POA: Diagnosis present

## 2022-09-02 DIAGNOSIS — R000 Tachycardia, unspecified: Secondary | ICD-10-CM | POA: Diagnosis present

## 2022-09-02 DIAGNOSIS — F323 Major depressive disorder, single episode, severe with psychotic features: Secondary | ICD-10-CM | POA: Diagnosis present

## 2022-09-02 DIAGNOSIS — R410 Disorientation, unspecified: Secondary | ICD-10-CM

## 2022-09-02 DIAGNOSIS — E8770 Fluid overload, unspecified: Secondary | ICD-10-CM | POA: Diagnosis present

## 2022-09-02 DIAGNOSIS — K7031 Alcoholic cirrhosis of liver with ascites: Secondary | ICD-10-CM | POA: Diagnosis present

## 2022-09-02 DIAGNOSIS — M60861 Other myositis, right lower leg: Secondary | ICD-10-CM | POA: Diagnosis present

## 2022-09-02 DIAGNOSIS — L03116 Cellulitis of left lower limb: Secondary | ICD-10-CM | POA: Diagnosis present

## 2022-09-02 DIAGNOSIS — F32A Depression, unspecified: Secondary | ICD-10-CM | POA: Diagnosis present

## 2022-09-02 DIAGNOSIS — Z801 Family history of malignant neoplasm of trachea, bronchus and lung: Secondary | ICD-10-CM

## 2022-09-02 DIAGNOSIS — M869 Osteomyelitis, unspecified: Secondary | ICD-10-CM | POA: Diagnosis not present

## 2022-09-02 DIAGNOSIS — K7290 Hepatic failure, unspecified without coma: Secondary | ICD-10-CM | POA: Diagnosis present

## 2022-09-02 DIAGNOSIS — Z515 Encounter for palliative care: Secondary | ICD-10-CM | POA: Diagnosis not present

## 2022-09-02 DIAGNOSIS — K219 Gastro-esophageal reflux disease without esophagitis: Secondary | ICD-10-CM | POA: Diagnosis present

## 2022-09-02 DIAGNOSIS — Z7189 Other specified counseling: Secondary | ICD-10-CM | POA: Diagnosis not present

## 2022-09-02 DIAGNOSIS — K7460 Unspecified cirrhosis of liver: Secondary | ICD-10-CM | POA: Diagnosis not present

## 2022-09-02 DIAGNOSIS — M86261 Subacute osteomyelitis, right tibia and fibula: Secondary | ICD-10-CM | POA: Diagnosis not present

## 2022-09-02 DIAGNOSIS — E871 Hypo-osmolality and hyponatremia: Secondary | ICD-10-CM | POA: Diagnosis present

## 2022-09-02 DIAGNOSIS — Z89511 Acquired absence of right leg below knee: Secondary | ICD-10-CM

## 2022-09-02 DIAGNOSIS — Z8673 Personal history of transient ischemic attack (TIA), and cerebral infarction without residual deficits: Secondary | ICD-10-CM

## 2022-09-02 LAB — CBC WITH DIFFERENTIAL/PLATELET
Abs Immature Granulocytes: 0.01 10*3/uL (ref 0.00–0.07)
Basophils Absolute: 0 10*3/uL (ref 0.0–0.1)
Basophils Relative: 1 %
Eosinophils Absolute: 0.3 10*3/uL (ref 0.0–0.5)
Eosinophils Relative: 6 %
HCT: 43.1 % (ref 36.0–46.0)
Hemoglobin: 14.5 g/dL (ref 12.0–15.0)
Immature Granulocytes: 0 %
Lymphocytes Relative: 34 %
Lymphs Abs: 1.4 10*3/uL (ref 0.7–4.0)
MCH: 30.1 pg (ref 26.0–34.0)
MCHC: 33.6 g/dL (ref 30.0–36.0)
MCV: 89.4 fL (ref 80.0–100.0)
Monocytes Absolute: 0.5 10*3/uL (ref 0.1–1.0)
Monocytes Relative: 11 %
Neutro Abs: 2.1 10*3/uL (ref 1.7–7.7)
Neutrophils Relative %: 48 %
Platelets: 138 10*3/uL — ABNORMAL LOW (ref 150–400)
RBC: 4.82 MIL/uL (ref 3.87–5.11)
RDW: 17.2 % — ABNORMAL HIGH (ref 11.5–15.5)
WBC: 4.2 10*3/uL (ref 4.0–10.5)
nRBC: 0 % (ref 0.0–0.2)

## 2022-09-02 LAB — COMPREHENSIVE METABOLIC PANEL
ALT: 21 U/L (ref 0–44)
AST: 48 U/L — ABNORMAL HIGH (ref 15–41)
Albumin: 2.2 g/dL — ABNORMAL LOW (ref 3.5–5.0)
Alkaline Phosphatase: 161 U/L — ABNORMAL HIGH (ref 38–126)
Anion gap: 14 (ref 5–15)
BUN: <5 mg/dL — ABNORMAL LOW (ref 6–20)
CO2: 26 mmol/L (ref 22–32)
Calcium: 8.1 mg/dL — ABNORMAL LOW (ref 8.9–10.3)
Chloride: 96 mmol/L — ABNORMAL LOW (ref 98–111)
Creatinine, Ser: 0.59 mg/dL (ref 0.44–1.00)
GFR, Estimated: >60 mL/min (ref >60)
Glucose, Bld: 60 mg/dL — ABNORMAL LOW (ref 70–99)
Potassium: 3.1 mmol/L — ABNORMAL LOW (ref 3.5–5.1)
Sodium: 136 mmol/L (ref 135–145)
Total Bilirubin: 4.2 mg/dL — ABNORMAL HIGH (ref 0.3–1.2)
Total Protein: 6.3 g/dL — ABNORMAL LOW (ref 6.5–8.1)

## 2022-09-02 LAB — RESP PANEL BY RT-PCR (RSV, FLU A&B, COVID)  RVPGX2
Influenza A by PCR: NEGATIVE
Influenza B by PCR: NEGATIVE
Resp Syncytial Virus by PCR: NEGATIVE
SARS Coronavirus 2 by RT PCR: NEGATIVE

## 2022-09-02 LAB — C-REACTIVE PROTEIN: CRP: 4.4 mg/dL — ABNORMAL HIGH (ref <1.0)

## 2022-09-02 LAB — MAGNESIUM: Magnesium: 1.3 mg/dL — ABNORMAL LOW (ref 1.7–2.4)

## 2022-09-02 LAB — I-STAT BETA HCG BLOOD, ED (MC, WL, AP ONLY): I-stat hCG, quantitative: <5 m[IU]/mL (ref <5)

## 2022-09-02 LAB — AMMONIA: Ammonia: 32 umol/L (ref 9–35)

## 2022-09-02 LAB — LACTIC ACID, PLASMA: Lactic Acid, Venous: 1.5 mmol/L (ref 0.5–1.9)

## 2022-09-02 MED ORDER — MAGNESIUM SULFATE 2 GM/50ML IV SOLN
2.0000 g | Freq: Once | INTRAVENOUS | Status: AC
  Administered 2022-09-02: 2 g via INTRAVENOUS
  Filled 2022-09-02: qty 50

## 2022-09-02 MED ORDER — FENTANYL CITRATE PF 50 MCG/ML IJ SOSY
50.0000 ug | PREFILLED_SYRINGE | Freq: Once | INTRAMUSCULAR | Status: AC
  Administered 2022-09-02: 50 ug via INTRAVENOUS
  Filled 2022-09-02: qty 1

## 2022-09-02 MED ORDER — ACETAMINOPHEN 650 MG RE SUPP: 650.0000 mg | Freq: Four times a day (QID) | RECTAL | Status: DC | PRN

## 2022-09-02 MED ORDER — VANCOMYCIN HCL IN DEXTROSE 1-5 GM/200ML-% IV SOLN
1000.0000 mg | Freq: Once | INTRAVENOUS | Status: AC
  Administered 2022-09-02: 1000 mg via INTRAVENOUS
  Filled 2022-09-02: qty 200

## 2022-09-02 MED ORDER — POTASSIUM CHLORIDE 20 MEQ PO PACK
40.0000 meq | PACK | Freq: Two times a day (BID) | ORAL | Status: DC
  Administered 2022-09-02 (×2): 40 meq via ORAL
  Filled 2022-09-02 (×2): qty 2

## 2022-09-02 MED ORDER — ACETAMINOPHEN 325 MG PO TABS
650.0000 mg | ORAL_TABLET | Freq: Four times a day (QID) | ORAL | Status: DC | PRN
  Administered 2022-09-03 – 2022-09-05 (×3): 650 mg via ORAL
  Filled 2022-09-02 (×4): qty 2

## 2022-09-02 MED ORDER — ENOXAPARIN SODIUM 40 MG/0.4ML IJ SOSY
40.0000 mg | PREFILLED_SYRINGE | INTRAMUSCULAR | Status: DC
  Administered 2022-09-04 – 2022-09-06 (×3): 40 mg via SUBCUTANEOUS
  Filled 2022-09-02 (×4): qty 0.4

## 2022-09-02 MED ORDER — SODIUM CHLORIDE 0.9 % IV SOLN
2.0000 g | Freq: Once | INTRAVENOUS | Status: AC
  Administered 2022-09-02: 2 g via INTRAVENOUS
  Filled 2022-09-02: qty 12.5

## 2022-09-02 MED ORDER — POTASSIUM CHLORIDE CRYS ER 20 MEQ PO TBCR
60.0000 meq | EXTENDED_RELEASE_TABLET | Freq: Once | ORAL | Status: AC
  Administered 2022-09-02: 60 meq via ORAL
  Filled 2022-09-02: qty 3

## 2022-09-02 MED ORDER — ACETAMINOPHEN 325 MG PO TABS
650.0000 mg | ORAL_TABLET | Freq: Once | ORAL | Status: AC
  Administered 2022-09-02: 650 mg via ORAL
  Filled 2022-09-02: qty 2

## 2022-09-02 MED ORDER — CEFAZOLIN SODIUM-DEXTROSE 1-4 GM/50ML-% IV SOLN: 1.0000 g | Freq: Once | INTRAVENOUS | Status: DC

## 2022-09-02 MED ORDER — LACTATED RINGERS IV BOLUS (SEPSIS)
500.0000 mL | Freq: Once | INTRAVENOUS | Status: AC
  Administered 2022-09-02: 500 mL via INTRAVENOUS

## 2022-09-02 NOTE — ED Notes (Signed)
Consulting MD at bedside

## 2022-09-02 NOTE — ED Provider Notes (Signed)
[facility] EMERGENCY DEPARTMENT Provider Note   CSN: [id number] Arrival date & time: 09/02/22  1315     History  Chief Complaint  Patient presents with   Leg Swelling    L lower leg weeping   Altered Mental Status   Leg Pain    R BKA with pain    Briana Sweeney is a 51 y.o. female.   Altered Mental Status Leg Pain Patient presents for bilateral leg pain.  Medical history includes alcohol abuse, polysubstance abuse, cirrhosis, GERD, seizures, anxiety, depression, hepatitis C, CKD.  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  She was seen in the ED 1 month ago for concern of drainage around her BKA wound site.  She was prescribed doxycycline.  She followed up with orthopedic surgery 3 weeks ago.  She states that she has had left leg pain, swelling, and redness that been worsening over the past week.  She also states that the area of her right stump pain did previously resolve but has come back.  Pain became severe today and the patient called EMS.  Patient lives alone.  She utilizes a wheelchair for mobility.  When EMS arrived on scene, they noted some confusion.     Home Medications Prior to Admission medications   Medication Sig Start Date End Date Taking? Authorizing Provider  acamprosate (CAMPRAL) 333 MG tablet Take 2 tablets (666 mg total) by mouth 3 (three) times daily Patient not taking: Reported on 05/13/2022 04/09/22     acetaminophen (TYLENOL) 325 MG tablet Take 1-2 tablets (325-650 mg total) by mouth every 6 (six) hours as needed for mild pain. 07/22/22   Setzer, Edman Circle, PA-C  ARIPiprazole (ABILIFY) 10 MG tablet Take 1 tablet (10 mg total) by mouth daily. 07/23/22   Setzer, Edman Circle, PA-C  ARIPiprazole (ABILIFY) 15 MG tablet Take 1 tablet (15 mg total) by mouth daily. 08/15/22     ascorbic acid (VITAMIN C) 1000 MG tablet Take 1 tablet (1,000 mg total) by mouth daily. 07/23/22   Setzer, Edman Circle, PA-C  clonazePAM (KLONOPIN) 0.5 MG tablet Take 1  tablet (0.5 mg total) by mouth 2 (two) times daily as needed (anxiety). 07/22/22   Setzer, Edman Circle, PA-C  clonazePAM (KLONOPIN) 0.5 MG tablet Take 1 tablet (0.5 mg total) by mouth 2 (two) times daily. 08/15/22     doxycycline (VIBRAMYCIN) 100 MG capsule Take 1 capsule (100 mg total) by mouth 2 (two) times daily for 10 days. 08/08/22   Curatolo, Adam, DO  DULoxetine (CYMBALTA) 60 MG capsule Take 1 capsule (60 mg total) by mouth at bedtime. 08/26/22   Kirsteins, Luanna Salk, MD  FLUoxetine (PROZAC) 20 MG capsule Take 3 capsules (60 mg total) by mouth every morning. 40/9/81     folic acid (FOLVITE) 1 MG tablet Take 1 tablet (1 mg total) by mouth daily. 07/23/22   Setzer, Edman Circle, PA-C  furosemide (LASIX) 40 MG tablet Take 1 tablet (40 mg total) by mouth daily. 07/22/22   Setzer, Edman Circle, PA-C  gabapentin (NEURONTIN) 300 MG capsule Take 2 capsules (600 mg total) by mouth 3 (three) times daily. 07/22/22   Setzer, Edman Circle, PA-C  hydrocortisone (ANUSOL-HC) 2.5 % rectal cream Place rectally 2 (two) times daily as needed for hemorrhoids or anal itching. 07/22/22   Setzer, Edman Circle, PA-C  HYDROmorphone (DILAUDID) 4 MG tablet Take 1 tablet (4 mg total) by mouth 2 (two) times daily as needed for severe pain. 08/08/22  Curatolo, Adam, DO  lactulose (CHRONULAC) 10 GM/15ML solution Take 30 mLs (20 g total) by mouth 2 (two) times daily. 07/22/22   Setzer, Edman Circle, PA-C  levETIRAcetam (KEPPRA) 750 MG tablet Take 1 tablet (750 mg total) by mouth 2 (two) times daily. 07/22/22   Setzer, Edman Circle, PA-C  Multiple Vitamin (MULTIVITAMIN WITH MINERALS) TABS tablet Take 1 tablet by mouth daily. 07/23/22   Setzer, Edman Circle, PA-C  nadolol (CORGARD) 20 MG tablet Take 1 tablet (20 mg total) by mouth every morning. 07/23/22   Setzer, Edman Circle, PA-C  nutrition supplement, JUVEN, (JUVEN) PACK Take 1 packet by mouth 2 (two) times daily between meals. 07/22/22   Setzer, Edman Circle, PA-C  pantoprazole (PROTONIX) 40 MG tablet Take 1  tablet (40 mg total) by mouth daily. 07/22/22   Setzer, Edman Circle, PA-C  spironolactone (ALDACTONE) 100 MG tablet Take 1 tablet (100 mg total) by mouth daily. 07/23/22   Setzer, Edman Circle, PA-C  thiamine (VITAMIN B-1) 100 MG tablet Take 1 tablet (100 mg total) by mouth daily. 07/23/22   Setzer, Edman Circle, PA-C  traZODone (DESYREL) 50 MG tablet Take 1 tablet (50 mg total) by mouth at bedtime as needed for sleep 08/15/22         Allergies    Other    Review of Systems   Review of Systems  Cardiovascular:  Positive for leg swelling.  Skin:  Positive for color change.  All other systems reviewed and are negative.   Physical Exam Updated Vital Signs BP 108/81 (BP Location: Left Arm)   Pulse (!) 101   Temp 98.1 F (36.7 C) (Oral)   Resp 14   Ht '5\' 6"'$  (1.676 m)   Wt 60.8 kg   LMP  (LMP Unknown) Comment: MENAPAUSE AT 51 YEARS OLD  SpO2 97%   BMI 21.63 kg/m  Physical Exam Vitals and nursing note reviewed.  Constitutional:      General: She is not in acute distress.    Appearance: Normal appearance. She is well-developed. She is not ill-appearing, toxic-appearing or diaphoretic.  HENT:     Head: Normocephalic and atraumatic.     Right Ear: External ear normal.     Left Ear: External ear normal.     Nose: Nose normal.     Mouth/Throat:     Mouth: Mucous membranes are moist.  Eyes:     Extraocular Movements: Extraocular movements intact.     Conjunctiva/sclera: Conjunctivae normal.  Cardiovascular:     Rate and Rhythm: Normal rate and regular rhythm.     Heart sounds: No murmur heard. Pulmonary:     Effort: Pulmonary effort is normal. No respiratory distress.     Breath sounds: Normal breath sounds. No wheezing or rales.  Chest:     Chest wall: No tenderness.  Abdominal:     General: There is distension.     Palpations: Abdomen is soft.     Tenderness: There is no abdominal tenderness.  Musculoskeletal:        General: Swelling and tenderness present.     Cervical back:  Normal range of motion and neck supple.     Left lower leg: Edema present.  Skin:    General: Skin is warm and dry.     Capillary Refill: Capillary refill takes less than 2 seconds.     Findings: Erythema present.  Neurological:     General: No focal deficit present.     Mental Status: She is alert. She is  disoriented.     Cranial Nerves: No cranial nerve deficit.     Sensory: No sensory deficit.     Motor: No weakness.     Coordination: Coordination normal.  Psychiatric:        Mood and Affect: Mood normal.        Behavior: Behavior normal.     ED Results / Procedures / Treatments   Labs (all labs ordered are listed, but only abnormal results are displayed) Labs Reviewed  COMPREHENSIVE METABOLIC PANEL - Abnormal; Notable for the following components:      Result Value   Potassium 3.1 (*)    Chloride 96 (*)    Glucose, Bld 60 (*)    BUN <5 (*)    Calcium 8.1 (*)    Total Protein 6.3 (*)    Albumin 2.2 (*)    AST 48 (*)    Alkaline Phosphatase 161 (*)    Total Bilirubin 4.2 (*)    All other components within normal limits  CBC WITH DIFFERENTIAL/PLATELET - Abnormal; Notable for the following components:   RDW 17.2 (*)    Platelets 138 (*)    All other components within normal limits  MAGNESIUM - Abnormal; Notable for the following components:   Magnesium 1.3 (*)    All other components within normal limits  RESP PANEL BY RT-PCR (RSV, FLU A&B, COVID)  RVPGX2  CULTURE, BLOOD (ROUTINE X 2)  CULTURE, BLOOD (ROUTINE X 2)  LACTIC ACID, PLASMA  AMMONIA  LACTIC ACID, PLASMA  URINALYSIS, ROUTINE W REFLEX MICROSCOPIC  PROTIME-INR  SEDIMENTATION RATE  C-REACTIVE PROTEIN  ETHANOL  I-STAT BETA HCG BLOOD, ED ([facility], [facility], [facility] ONLY)  CBG MONITORING, ED    EKG EKG Interpretation  Date/Time:  Monday September 02 2022 16:28:33 EST Ventricular Rate:  101 PR Interval:  154 QRS Duration: 81 QT Interval:  373 QTC Calculation: 484 R Axis:   65 Text Interpretation: Sinus  tachycardia Anteroseptal infarct, age indeterminate Confirmed by Godfrey Pick [phone]) on 09/02/2022 4:34:23 PM  Radiology CT Head Wo Contrast  Result Date: 09/02/2022 CLINICAL DATA:  Altered mental status EXAM: CT HEAD WITHOUT CONTRAST TECHNIQUE: Contiguous axial images were obtained from the base of the skull through the vertex without intravenous contrast. RADIATION DOSE REDUCTION: This exam was performed according to the departmental dose-optimization program which includes automated exposure control, adjustment of the mA and/or kV according to patient size and/or use of iterative reconstruction technique. COMPARISON:  03/29/2022 FINDINGS: Brain: Normal anatomic configuration. Diffuse parenchymal volume loss is slightly greater than would be typically expected given the patient's age. This appears stable since prior examination, however. Mild periventricular white matter changes are present likely reflecting the sequela of small vessel ischemia. 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 Benign punctate nonspecific cortical calcification within the left cerebellum. No abnormal intra or extra-axial mass lesion or fluid collection. No abnormal mass effect or midline shift. No evidence of acute intracranial hemorrhage or infarct. Ventricular size is normal. Cerebellum unremarkable. Vascular: No asymmetric hyperdense vasculature at the skull base. Skull: Intact Sinuses/Orbits: Paranasal sinuses are clear. Orbits are unremarkable. Other: Mastoid air cells and middle ear cavities are clear. IMPRESSION: 1. No acute intracranial hemorrhage or infarct. 2. Stable mild senescent change. 3. 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 Electronically Signed   By: Fidela Salisbury M.D.   On: 09/02/2022 21:22   DG Knee 2 Views Right  Result Date: 09/02/2022 CLINICAL DATA:  Right hip pain EXAM: RIGHT KNEE -  2 VIEW COMPARISON:  Knee radiograph dated August 08, 2022 FINDINGS: No  evidence of fracture, dislocation, or joint effusion. No evidence of arthropathy. No osseous irregularity along the resection margin. Increased soft tissue edema of the stump. IMPRESSION: Increased soft tissue edema of the stump. No osseous irregularity along the resection margin to suggest osteomyelitis. Electronically Signed   By: Yetta Glassman M.D.   On: 09/02/2022 19:45   DG Ankle 2 Views Left  Result Date: 09/02/2022 CLINICAL DATA:  Left ankle pain EXAM: LEFT ANKLE - 2 VIEW COMPARISON:  08/08/2022 FINDINGS: Soft tissue swelling about the ankle greatest over the lateral malleolus. No acute fracture or dislocation. IMPRESSION: No acute fracture.  Soft tissue swelling over the lateral malleolus. Electronically Signed   By: Placido Sou M.D.   On: 09/02/2022 18:53   VAS [place] LOWER EXTREMITY VENOUS (DVT) (ONLY [facility] & [facility])  Result Date: 09/02/2022  Lower Venous DVT Study Patient Name:  Briana Sweeney  Date of Exam:   09/02/2022 Medical Rec #: [id number]          Accession #:    [id number] Date of Birth: [birth year]          Patient Gender: F Patient Age:   59 years Exam Location:  [facility] Procedure:      VAS [place] LOWER EXTREMITY VENOUS (DVT) Referring Phys: Thurmond Butts Jovi Alvizo --------------------------------------------------------------------------------  Indications: Bilateral pain, swelling, erythema. Other Indications: History of RT BKA. Comparison Study: 07-09-2022 Prior left lower extremity venous.                   06-29-2022 Prior right lower extremity venous. Performing Technologist: Darlin Coco RDMS, RVT  Examination Guidelines: A complete evaluation includes B-mode imaging, spectral Doppler, color Doppler, and power Doppler as needed of all accessible portions of each vessel. Bilateral testing is considered an integral part of a complete examination. Limited examinations for reoccurring indications may be performed as noted. The reflux portion of the exam is performed with the patient in  reverse Trendelenburg.  +---------+---------------+---------+-----------+----------+--------------+ RIGHT    CompressibilityPhasicitySpontaneityPropertiesThrombus Aging +---------+---------------+---------+-----------+----------+--------------+ CFV      Full           Yes      Yes                                 +---------+---------------+---------+-----------+----------+--------------+ SFJ      Full                                                        +---------+---------------+---------+-----------+----------+--------------+ FV Prox  Full                                                        +---------+---------------+---------+-----------+----------+--------------+ FV Mid   Full                                                        +---------+---------------+---------+-----------+----------+--------------+ FV DistalFull                                                        +---------+---------------+---------+-----------+----------+--------------+  PFV      Full                                                        +---------+---------------+---------+-----------+----------+--------------+ POP      Full           Yes      Yes                                 +---------+---------------+---------+-----------+----------+--------------+ Gastroc  Full                                                        +---------+---------------+---------+-----------+----------+--------------+   +---------+---------------+---------+-----------+----------+--------------+ LEFT     CompressibilityPhasicitySpontaneityPropertiesThrombus Aging +---------+---------------+---------+-----------+----------+--------------+ CFV      Full           Yes      Yes                                 +---------+---------------+---------+-----------+----------+--------------+ SFJ      Full                                                         +---------+---------------+---------+-----------+----------+--------------+ FV Prox  Full                                                        +---------+---------------+---------+-----------+----------+--------------+ FV Mid   Full                                                        +---------+---------------+---------+-----------+----------+--------------+ FV DistalFull                                                        +---------+---------------+---------+-----------+----------+--------------+ PFV      Full                                                        +---------+---------------+---------+-----------+----------+--------------+ POP      Full           Yes      Yes                                 +---------+---------------+---------+-----------+----------+--------------+  PTV      Full                                                        +---------+---------------+---------+-----------+----------+--------------+ PERO     Full                                                        +---------+---------------+---------+-----------+----------+--------------+ Gastroc  Full                                                        +---------+---------------+---------+-----------+----------+--------------+    Summary: RIGHT: - There is no evidence of deep vein thrombosis in the lower extremity.  - No cystic structure found in the popliteal fossa.  LEFT: - There is no evidence of deep vein thrombosis in the lower extremity.  - No cystic structure found in the popliteal fossa.  *See table(s) above for measurements and observations.    Preliminary    DG Chest Port 1 View  Result Date: 09/02/2022 CLINICAL DATA:  Questionable sepsis EXAM: PORTABLE CHEST 1 VIEW COMPARISON:  Chest x-ray dated May 13, 2022 FINDINGS: Cardiac and mediastinal contours are within normal limits. Calcified left hilar lymph nodes and calcified granuloma of the left lower  lobe. Lungs otherwise clear. Elevation of the right hemidiaphragm. No pleural effusion or pneumothorax IMPRESSION: No active disease. Electronically Signed   By: Yetta Glassman M.D.   On: 09/02/2022 14:11    Procedures Procedures    Medications Ordered in ED Medications  potassium chloride (KLOR-CON) packet 40 mEq (40 mEq Oral Given 09/02/22 1611)  vancomycin (VANCOCIN) IVPB 1000 mg/200 mL premix (has no administration in time range)  fentaNYL (SUBLIMAZE) injection 50 mcg (has no administration in time range)  lactated ringers bolus 500 mL (0 mLs Intravenous Stopped 09/02/22 1720)  acetaminophen (TYLENOL) tablet 650 mg (650 mg Oral Given 09/02/22 1610)  fentaNYL (SUBLIMAZE) injection 50 mcg (50 mcg Intravenous Given 09/02/22 1608)  magnesium sulfate IVPB 2 g 50 mL (0 g Intravenous Stopped 09/02/22 1720)  ceFEPIme (MAXIPIME) 2 g in sodium chloride 0.9 % 100 mL IVPB (2 g Intravenous New Bag/Given 09/02/22 1719)    ED Course/ Medical Decision Making/ A&P                           Medical Decision Making Amount and/or Complexity of Data Reviewed Labs: ordered. Radiology: ordered. ECG/medicine tests: ordered.  Risk OTC drugs. Prescription drug management.   This patient presents to the ED for concern of lower extremity pain and confusion, this involves an extensive number of treatment options, and is a complaint that carries with it a high risk of complications and morbidity.  The differential diagnosis includes cellulitis, venous stasis, medication withdrawal, hepatic encephalopathy, intracranial injury, intoxication   Co morbidities that complicate the patient evaluation  alcohol abuse, polysubstance abuse, cirrhosis, GERD, seizures, anxiety, depression, hepatitis C, CKD   Additional history obtained:  Additional history obtained from EMS  External records from outside source obtained and reviewed including EMR   Lab Tests:  I Ordered, and personally interpreted labs.   The pertinent results include: Hypomagnesemia, hypokalemia present.  Mild hyperglycemia is present.  Bilirubin is increased from baseline.  No leukocytosis is present.  Hemoglobin is normal.  Lactate is normal.  Ammonia is normal.   Imaging Studies ordered:  I ordered imaging studies including x-ray of chest, left ankle, right stump, CT of head I independently visualized and interpreted imaging which showed no acute osseous or intracranial findings.  Soft tissue swelling on lower extremity x-rays.  DVT studies were negative I agree with the radiologist interpretation   Cardiac Monitoring: / EKG:  The patient was maintained on a cardiac monitor.  I personally viewed and interpreted the cardiac monitored which showed an underlying rhythm of: Sinus rhythm   Problem List / ED Course / Critical interventions / Medication management  Patient is a 51 year old female with history of cirrhosis, presenting from home via EMS for lower extremity pain.  She underwent right BKA 2 months ago and was subsequently treated for a localized infection with doxycycline.  She did follow-up with her orthopedic doctor 3 weeks ago and wound was well-appearing at that time.  She states that over the past week, she has developed pain, swelling, and redness to her distal left lower extremity as well as to her right stump area.  This prompted her call to EMS.  EMS noted confusion on scene.  On arrival, patient remains disoriented.  She was found to have a fever.  Patient was given Tylenol for antipyresis.  IV fluids were given.  Fentanyl was given for analgesia.  Workup was initiated to assess for source of infection.  On exam, areas of concern handily lower extremities are erythematous and warm to the touch.  This does raise concern for cellulitis.  Patient was given antibiotics for empiric treatment of cellulitis.  Lab work shows hypokalemia and hypomagnesemia.  Replacement electrolytes were given.  On reassessment, patient  has continued severe pain.  Additional pain medication was ordered.  At this point, she is oriented, improved from her arrival.  CT imaging of head shows no acute findings.  X-ray imaging of lower extremity shows soft tissue swelling only without clear osseous involvement.  DVT study was negative.  Given her initial confusion, fever without an alternative source, and ongoing severe pain, patient to be admitted for continued IV antibiotics and observation.  She was admitted to medicine for further management. I ordered medication including IV fluids for hydration; antibiotics for cellulitis; magnesium sulfate for hypomagnesemia; potassium chloride for hypokalemia; fentanyl for analgesia Reevaluation of the patient after these medicines showed that the patient improved I have reviewed the patients home medicines and have made adjustments as needed   Social Determinants of Health:  Lives alone         Final Clinical Impression(s) / ED Diagnoses Final diagnoses:  Cellulitis of lower extremity, unspecified laterality  Confusion  Hypokalemia  Hypomagnesemia    Rx / DC Orders ED Discharge Orders     None         Godfrey Pick, MD 09/02/22 2126

## 2022-09-02 NOTE — Hospital Course (Addendum)
Briana Sweeney is a 51 y.o. female with a past medical history of alcohol use disorder, polysubstance use disorder, cirrhosis, hepatitis C, GERD, and seizures who presented to the emergency room with concerns of bilateral lower extremity pain and erythema.  MRI showed evidence of right lower extremity osteomyelitis and myositis, and patient admitted for further evaluation and management.   #Right lower extremity osteomyelitis #Right posterior calf myositis #Left lower extremity cellulitis MRI findings suggestive of osteomyelitis of right stump site and myositis of right posterior calf muscles.  Today is day 4 of IV vancomycin and day 3 of ceftriaxone.  Patient does have significant pain to bilateral lower extremities.  On exam, there is warmth and erythema noted to bilateral lower extremities.  Blood cultures with no growth thus far.  Will get Ortho involved given new diagnosis of osteomyelitis. -Ortho following -Day 4 vancomycin -Day 3 ceftriaxone -Monitor for worsening white count -Monitor fever curve -Patient n.p.o. for potential surgical intervention   #Decompensated liver cirrhosis #Hepatic encephalopathy #Alcohol use disorder Patient presents with decompensated liver cirrhosis.  Patient had paracentesis yesterday with removal of 1.3 L of fluid.  Unable to calculate SAG given albumin of fluid less than 1.5.  MELD score 17.  Child Pugh class C.  Patient is currently on nadolol, spironolactone, Lasix, and lactulose.  On exam, patient does not have Asterixis but there is slightly distended abdomen and scleral icterus noted.  Given hypotension, will need to decrease spironolactone and Lasix.  Patient has not had a bowel movement since the first day of admission.  Patient will likely need to increase her bowel regimen with lactulose. -Hold spironolactone and Lasix today -Continue nadolol -Increase lactulose -Add rifaximin today -Consider paracentesis if needed -Continue ceftriaxone day 3 of  7 -Follow cultures -Continue thiamine supplementation   #Hypotension Overnight, patient required to be evaluated for maps dropping below 65 and systolics below 90.  This likely is multifactorial in the setting of centrally acting medications, antihypertensives, poor p.o. intake, and active infection.  Patient required albumin as well as midodrine overnight.  Patient also required fluids. -Start maintenance fluids -Lasix and spironolactone -Encourage increased p.o. intake -Continue to monitor blood pressure closely -Continue midodrine 10 mg 3 times daily   #AKI Likely prerenal in the setting of hypotension.  Creatinine bumped from 0.70-1.09 overnight. -Start maintenance fluids   #Hyponatremia Slightly decreased to 133 from 135 yesterday.  No acute concerns.  Continue to monitor. -BMP in a.m.   #Acute psychosis likely secondary to substance use #Anxiety/depression No concerns currently. -Continue Abilify 15 mg daily -Continue Klonopin 0.5 mg twice daily -Continue Cymbalta 60 mg daily -Continue gabapentin 300 mg 3 times daily -Psychiatry following -Haldol and Ativan as needed for agitation   #GERD Chronic -Continue pantoprazole 40 mg daily

## 2022-09-02 NOTE — H&P (Incomplete)
Date: 09/03/2022               Patient Name:  Briana Sweeney MRN: [id number]  DOB: August 22, 1971 Age / Sex: 51 y.o., female   PCP: Marice Potter, MD         Medical Service: Internal Medicine Teaching Service         Attending Physician: Dr. Charise Killian, MD    First Contact: Dr. Claudia Desanctis Sasha Rogel Pager: [phone]  Second Contact: Dr. Idamae Schuller Pager: [phone]       After Hours (After 5p/  First Contact Pager: [phone]  weekends / holidays): Second Contact Pager: [phone]   Chief Complaint: RLE pain   History of Present Illness:   Briana Sweeney is a 51 y/o female with past medical history of alcohol abuse, polysubstance abuse, cirrhosis, Hep C, GERD, seizures, CKD3, s/p right BKA, anxiety, and depression that presents with 2 weeks of worsening right lower extremity pain.  Patient is s/p right BKA 2 months ago.  She was seen in the emergency department on 08/08/2022 for worsening pain and yellow drainage from her surgical wound.  She was discharged with Dilaudid and doxycycline 100 mg BID.  Patient reports that she completed this course of antibiotics about 3 days ago.  She last followed up with Ortho approximately 1 week ago.  She endorses swelling and erythema of her right lower extremity over the last 2 weeks. She reports white-yellow drainage from her RLE surgical wound over the last 2 weeks. She also reports subjective fever, cough, and decreased appetite over the last few days.  Patient endorses 1 episode of nonbloody/nonbilious vomiting approximately 1 week ago. She reports dysuria, urinary frequency, and decreased appetite over the last week.  She reports diffuse abdominal pain over the last several weeks.  She has also noticed increased abdominal swelling over this.  Time. She states that she receives paracentesis every 1-2 months for accumulation of fluid.  She states that she last underwent paracentesis 2 months ago.  Patient states that she hears her daughter and neighbor in  the hall who are "coming to fight" her.  She reports taking lactulose regularly and having 3+ bowel movements per day.  She states that she has not taken any medications today secondary to her right lower extremity pain.  She denies chills, lightheadedness, dizziness, syncope.   ED Course:  -RVP negative -Lactic acid WNL -CMP significant for hypokalemia at 3.1, hypoglycemia at 60, decreased albumin at 2.2, elevated AST at 48, elevated alk phos at 161, and elevated bilirubin at 4.2 -CBC significant for thrombocytopenia at 138 -Magnesium low at 1.3 -Ammonia elevated at 64 -Blood culture pending -UA pending -ESR/CRP pending -Ethanol pending -CXR negative for acute abnormality -XRAY left ankle significant for soft tissue swelling over the lateral malleolus -XRAY right knee significant for increased soft tissue edema of the stump without osseous irregularity to suggest osteomyelitis -CT head negative for acute abnormality but notes stable mild senescent change and remote lacunar infarct within the inferior left basal ganglia -EKG demonstrates sinus tachycardia -Bilateral vascular LE ultrasound negative for DVT -IV fentanyl 50 mcg administered -2X PO potassium 40 mEq administered -2X IV magnesium 2g administered -IV Cefepime 2g administered -IV vancomycin 1g administered  -500 mL LR bolus administered  Meds:  -Duloxetine 60 mg daily -Fluoxetine 20 mg (3 tablets in the morning) -Folic acid 1 mg daily -Lasix 40 mg daily -Lactulose 10 GM/15 mL solution (30 mL/20 g BID) -Keppra 750 mg BID -Nadolol 20  mg daily -Pantoprazole 40 mg daily -Spironolactone 100 mg daily -Thiamine 100 mg daily  -Acamprosate 333 mg (2 tablets TID) -Tylenol 325 mg every 6 hours -Abilify 10 mg daily -Vitamin C 1000 mg daily -Klonopin 0.5 mg BID -Gabapentin 300 mg (2 capsules TID) -Hydrocortisone rectal cream -Dilaudid 4 mg (BID PRN) -Trazodone 50 mg daily -Multivitamin -Nutritional supplement Juven No  outpatient medications have been marked as taking for the 09/02/22 encounter [facility] Encounter).     Allergies: Allergies as of 09/02/2022 - Review Complete 09/02/2022  Allergen Reaction Noted   Other Other (See Comments) 12/15/2019   Past Medical History:  Diagnosis Date   Abscess of bursa of left elbow 09/23/2018   Acute hyperactive alcohol withdrawal delirium ([place]) 10/09/2017   Acute low back pain with bilateral sciatica    Acute metabolic encephalopathy 09/11/7251   Acute on chronic pancreatitis ([place]) 04/04/2021   AKI (acute kidney injury) ([place]) 04/04/2021   Alcohol withdrawal syndrome with complication ([place]) 66/44/0347   Alcohol-induced acute pancreatitis    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[place]) 12/05/2018   Ascites due to alcoholic cirrhosis ([place])    Bipolar affective disorder ([place])    With anxiety features   Bunionette of right foot 11/2019   Cirrhosis of liver ([place])    Due to alcohol and hepatitis C   Cocaine dependence without complication ([place]) 42/59/5638   Decompensated hepatic cirrhosis ([place]) 07/23/2017   Depression with anxiety    Epistaxis 01/20/2019   Erysipelas 09/13/2018   Esophageal 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 ([place])    Esophageal varices without bleeding ([facility])    ETOHism ([facility])    GERD (gastroesophageal reflux disease)    Head injury    Hematemesis 02/10/2018   Hepatitis C 2018   hepatitis c and alcohol related hepatitis   Heroin abuse ([place])    History of blood transfusion    "blood doesn't clot; I fell down and had to have a transfusion"   History of kidney stones    Menopause 2016   Migraine    "when I get really stressed" (09/01/2017)   Neuropathy 10/27/2018   Olecranon bursitis of left elbow    Other mixed anxiety disorders 10/27/2018   Overdose 08/22/2018   Pancytopenia ([place]) 07/23/2017   Polysubstance abuse ([place]) 04/08/2018   Portal hypertension ([place]) 03/18/2018   S/P foot surgery, right 11/2019   Schizophrenia ([place])    Seizures ([place])    "when I run  out of my RX; lots recently" (09/01/2017)   Suicidal ideation    Thrombocytopenia ([place])    Trichimoniasis 10/30/2017   Upper GI bleed 02/10/2018   Urinary urgency 05/2020   UTI (urinary tract infection) 06/02/2017    Family History: lung cancer (mother), cirrhosis (mother), cancer of the neck (father).  Denies family history of MI, diabetes, stroke, DVT.  Social History: Patient currently lives at home alone and is on disability.  She reports heavy drinking in the past with her last drink occurring 1 month ago.  She denies current or prior tobacco use.  She denies current or prior recreational drug use.  Review of Systems: A complete ROS was negative except as per HPI.   Physical Exam: Blood pressure 108/79, pulse 92, temperature 98.8 F (37.1 C), temperature source Oral, resp. rate 13, height _0  (1.676 m), weight 60.8 kg, SpO2 97 %.  Constitutional: chronically ill-appearing, appears comfortable, answering some questions appropriately, some inappropriate reponses to questioning, easily distracted,  HENT: Normocephalic and atraumatic.  Eyes: EOMI. PERRL. Mild scleral icterus bilaterally.  Neck:  Normal range of motion.  Cardiovascular: Tachycardic, regular rhythm. No murmurs, rubs, or gallops. Normal radial pulses bilaterally. Normal PT pulse on the left. Normal popliteal pulse on the right. 1+ edema of the the left lower extremity from the upper shin down to the toes.  Pulmonary: Normal respiratory effort. No wheezes, rales, or rhonchi.   Abdominal: Soft. Distended. Diffuse abdominal tenderness. No rebound tenderness or guarding. Normal bowel sounds. No caput medusae.  Musculoskeletal: Right BKA.    Neurological: Alert and oriented to person, place, and time. Intermittently confused. Non-focal. Skin: warm and dry. Palmar erythema worse on the left. Erythema, warmth, and swelling of the right lower extremity from the level of BKA to the knee. Healed surgical scar on the distal end of  the right lower extremity with minimal purulent drainage (see media). No active bleeding. Erythema and healed abrasion on the left shin without drainage or active bleeding (see media). Overgrown toenails on all toes of the left foot. No jaundice.   EKG: personally reviewed my interpretation is sinus tachycardia  CXR: personally reviewed my interpretation is no acute abnormality  Assessment & Plan by Problem: Principal Problem:   Cellulitis of right lower extremity  Briana Sweeney is a 51 y/o female with past medical history of alcohol abuse, polysubstance abuse, cirrhosis, Hep C, GERD, seizures, CKD3, s/p right BKA, anxiety, and depression that presents with acutely worsening erythema, swelling, and pain of the right lower extremity and was admitted for management of cellulitis and AMS.   #RLE Cellulitis #Right BKA  #LLE Cellulitis Patient underwent right BKA 2 months ago.  Patient was seen in the ED on 11/30 for worsening pain and yellow drainage from her right BKA surgical wound.  She was discharged on doxycycline and reports completing this course of antibiotics.  Patient was last seen by Ortho on 08/13/2022.  Plan was for for prosthesis and to return for reevaluation in 4 weeks. She presents today with 2 weeks of worsening erythema, swelling, and pain of her right lower extremity.  Patient spiked a fever of 101.1 while in the ED.  No leukocytosis. Differential diagnosis includes erysipelas, abscess, osteomyelitis, DVT. Bilateral vascular LE ultrasound negative for DVT. XRAY of the right knee significant for increased soft tissue edema of the stump without osseous irregularity to suggest osteomyelitis. XRAY of the left ankle demonstrates soft tissue swelling over the lateral malleolus. On exam, patient has a healed surgical scar on the distal end of her right lower extremity with minimal purulent drainage and without active bleeding.  She has erythema, warmth, and swelling of her right lower extremity  from the level of the BKA to her knee.  Patient also has erythema of her left shin without active drainage or bleeding. She spiked a fever of 101.1 while in the ED. No leukocytosis. Lactic acid WNL. Suspicion for cellulitis of her right lower extremity and left lower extremity is high at this time.  Given her recent procedure of her right lower extremity, will order MRI of the right knee to further evaluate for osteomyelitis.  Patient received IV cefepime and IV vancomycin while in the ED.  500 mL LR bolus was also administered. -IV vancomycin -IV Cefepime -Pending blood cultures -Pending ESR/CRP -MRI right knee pending  #Encephalopathy #Cirrhosis #ETOH Abuse #Ascites Patient has longstanding history of alcohol abuse.  She reports that her last drink was 1 month ago. Patient has longstanding history of alcoholic cirrhosis. She history of variceal bleeding resulting in hemorrhagic shock. Most recent EGD in 03/2022  with small esophageal varices. Patient currently taking nadolol 20 mg daily, lactulosse 20 g BID, lasix 40 mg daily, spironolactone 767 mg daily, folic acid 1 mg daily, and thiamine 100 mg daily at home.  Patient reportedly confused per ED provider.  On exam, the patient was answering some questions appropriately with some inappropriate responses.  She was easily distracted.  She reported hearing her daughter and neighbor outside of her emergency room bed attempting to come fight her.  She is oriented to person, place, and time but is intermittently confused. CMP significant for decreased albumin at 2.2, elevated AST at 48, elevated alk phos at 161, and elevated bilirubin at 4.2. Ammonia elevated at 64. CT head negative for acute abnormality but notes stable mild senescent change and remote lacunar infarct within the inferior left basal ganglia.  Low suspicion for acute CVA at this time.  Given her longstanding history of alcohol abuse, her lab findings, and her confusion, concern for hepatic  encephalopathy is high.  She reports receiving paracentesis every 1-2 months for ascites.  On exam, she has abdominal distention and diffuse abdominal tenderness.  Will continue to home Lasix and spironolactone. -Folic acid 1 mg daily -Thiamine 100 mg daily -Spironolactone 100 mg daily -Lasix 40 mg daily -Lactulose 10 GM/15 mL solution (30 mL/20 g BID) -Nadolol 20 mg daily -Pending UA -Pending ethanol level  #Hypomagnesemia - resolved #Hypokalemia - resolved Potassium 3.1 on admission.  Magnesium 1.3 on admission. PO potassium and IV magnesium were administered while in the ED.  Potassium improved to 4.1. Magnesium improved to 2.1. Suspect hypokalemia and hypomagnesemia are secondary to chronic malnutrition. -Trend BMP -Magnesium level  #Hx of Seizures Patient has long history of seizures.  She takes Keppra 750 mg BID, Klonopin 0.5 mg BID, and Gabapentin 300 mg (2 capsules TID). Given concern for acute encephalopathy, will hold Klonopin and gabapentin at this time. -Keppra 750 mg BID   #GERD Patient takes pantoprazole 40 mg daily at home. -Pantoprazole 40 mg daily  #Anxiety #Depression Patient takes duloxetine 60 mg daily, fluoxetine 20 mg (3 tablets in the morning), Abilify 10 mg daily, and trazodone 50 mg daily.  Given concern for acute encephalopathy, will hold duloxetine, fluoxetine, and trazodone at this time. -Abilify 15 mg  Diet: NPO (sips w/ meds) Bowel: Lactulose VTE: Lovenox IVF: none Code: Full   Prior to Admission Living Arrangement: home by herself Anticipated Discharge Location: TBD Barriers to Discharge: continued management  Dispo: Admit patient to Inpatient with expected length of stay greater than 2 midnights.  Signed: Starlyn Skeans, MD 09/03/2022, 12:48 AM  Pager: [phone] After 5pm on weekdays and 1pm on weekends: On Call pager: [phone]

## 2022-09-02 NOTE — Progress Notes (Signed)
Lower extremity venous bilateral study completed.  Preliminary results relayed to Doren Custard, MD.  See CV Proc for preliminary results report.   Darlin Coco, RDMS, RVT

## 2022-09-02 NOTE — ED Triage Notes (Signed)
Pt arrives via EMS from home with C/O LL leg edema with some weeping, takes lasix. Also C/O R leg pain, recent R BKA. EMS reported a brief period of confusion while at her residents. VSS. Patient lives alone.

## 2022-09-03 ENCOUNTER — Inpatient Hospital Stay (HOSPITAL_COMMUNITY): Payer: No Typology Code available for payment source

## 2022-09-03 ENCOUNTER — Other Ambulatory Visit: Payer: No Typology Code available for payment source

## 2022-09-03 DIAGNOSIS — F05 Delirium due to known physiological condition: Secondary | ICD-10-CM

## 2022-09-03 DIAGNOSIS — L03116 Cellulitis of left lower limb: Secondary | ICD-10-CM | POA: Diagnosis not present

## 2022-09-03 DIAGNOSIS — Z89511 Acquired absence of right leg below knee: Secondary | ICD-10-CM

## 2022-09-03 DIAGNOSIS — L03115 Cellulitis of right lower limb: Secondary | ICD-10-CM

## 2022-09-03 LAB — COMPREHENSIVE METABOLIC PANEL
ALT: 20 U/L (ref 0–44)
AST: 50 U/L — ABNORMAL HIGH (ref 15–41)
Albumin: 2.1 g/dL — ABNORMAL LOW (ref 3.5–5.0)
Alkaline Phosphatase: 139 U/L — ABNORMAL HIGH (ref 38–126)
Anion gap: 14 (ref 5–15)
BUN: <5 mg/dL — ABNORMAL LOW (ref 6–20)
CO2: 24 mmol/L (ref 22–32)
Calcium: 8.1 mg/dL — ABNORMAL LOW (ref 8.9–10.3)
Chloride: 97 mmol/L — ABNORMAL LOW (ref 98–111)
Creatinine, Ser: 0.71 mg/dL (ref 0.44–1.00)
GFR, Estimated: >60 mL/min (ref >60)
Glucose, Bld: 72 mg/dL (ref 70–99)
Potassium: 4.1 mmol/L (ref 3.5–5.1)
Sodium: 135 mmol/L (ref 135–145)
Total Bilirubin: 4.2 mg/dL — ABNORMAL HIGH (ref 0.3–1.2)
Total Protein: 5.9 g/dL — ABNORMAL LOW (ref 6.5–8.1)

## 2022-09-03 LAB — ETHANOL: Alcohol, Ethyl (B): <10 mg/dL (ref <10)

## 2022-09-03 LAB — SEDIMENTATION RATE: Sed Rate: 12 mm/hr (ref 0–22)

## 2022-09-03 LAB — MAGNESIUM: Magnesium: 2.1 mg/dL (ref 1.7–2.4)

## 2022-09-03 MED ORDER — VANCOMYCIN HCL IN DEXTROSE 1-5 GM/200ML-% IV SOLN
1000.0000 mg | Freq: Two times a day (BID) | INTRAVENOUS | Status: DC
  Administered 2022-09-03 – 2022-09-05 (×5): 1000 mg via INTRAVENOUS
  Filled 2022-09-03 (×7): qty 200

## 2022-09-03 MED ORDER — LEVETIRACETAM 750 MG PO TABS: 750.0000 mg | ORAL_TABLET | Freq: Two times a day (BID) | ORAL | Status: DC

## 2022-09-03 MED ORDER — HALOPERIDOL LACTATE 5 MG/ML IJ SOLN: 1.0000 mg | Freq: Four times a day (QID) | INTRAMUSCULAR | Status: DC | PRN

## 2022-09-03 MED ORDER — LORAZEPAM 2 MG/ML IJ SOLN
2.0000 mg | Freq: Once | INTRAMUSCULAR | Status: AC
  Administered 2022-09-03: 2 mg via INTRAVENOUS
  Filled 2022-09-03: qty 1

## 2022-09-03 MED ORDER — ARIPIPRAZOLE 10 MG PO TABS
15.0000 mg | ORAL_TABLET | Freq: Every day | ORAL | Status: DC
  Administered 2022-09-03 – 2022-09-11 (×9): 15 mg via ORAL
  Filled 2022-09-03 (×9): qty 2

## 2022-09-03 MED ORDER — LEVETIRACETAM 500 MG PO TABS: 750.0000 mg | ORAL_TABLET | Freq: Two times a day (BID) | ORAL | Status: DC

## 2022-09-03 MED ORDER — THIAMINE MONONITRATE 100 MG PO TABS
100.0000 mg | ORAL_TABLET | Freq: Every day | ORAL | Status: DC
  Administered 2022-09-03: 100 mg via ORAL
  Filled 2022-09-03: qty 1

## 2022-09-03 MED ORDER — NADOLOL 20 MG PO TABS
20.0000 mg | ORAL_TABLET | Freq: Every morning | ORAL | Status: DC
  Administered 2022-09-03 – 2022-09-04 (×2): 20 mg via ORAL
  Filled 2022-09-03 (×2): qty 1

## 2022-09-03 MED ORDER — FUROSEMIDE 40 MG PO TABS
40.0000 mg | ORAL_TABLET | Freq: Every day | ORAL | Status: DC
  Administered 2022-09-03 – 2022-09-04 (×2): 40 mg via ORAL
  Filled 2022-09-03 (×2): qty 1

## 2022-09-03 MED ORDER — THIAMINE HCL 100 MG/ML IJ SOLN
100.0000 mg | Freq: Every day | INTRAMUSCULAR | Status: DC
  Administered 2022-09-04: 100 mg via INTRAVENOUS
  Filled 2022-09-03 (×2): qty 2

## 2022-09-03 MED ORDER — PANTOPRAZOLE SODIUM 40 MG PO TBEC
40.0000 mg | DELAYED_RELEASE_TABLET | Freq: Every day | ORAL | Status: DC
  Administered 2022-09-03 – 2022-09-10 (×8): 40 mg via ORAL
  Filled 2022-09-03 (×8): qty 1

## 2022-09-03 MED ORDER — DULOXETINE HCL 30 MG PO CPEP: 60.0000 mg | ORAL_CAPSULE | Freq: Every day | ORAL | Status: DC

## 2022-09-03 MED ORDER — LACTULOSE 10 GM/15ML PO SOLN
20.0000 g | Freq: Two times a day (BID) | ORAL | Status: DC
  Administered 2022-09-03 – 2022-09-04 (×4): 20 g via ORAL
  Filled 2022-09-03 (×5): qty 30

## 2022-09-03 MED ORDER — CLONAZEPAM 0.5 MG PO TABS: 0.5000 mg | ORAL_TABLET | Freq: Two times a day (BID) | ORAL | Status: DC

## 2022-09-03 MED ORDER — SODIUM CHLORIDE 0.9 % IV SOLN
2.0000 g | INTRAVENOUS | Status: AC
  Administered 2022-09-03 – 2022-09-06 (×4): 2 g via INTRAVENOUS
  Filled 2022-09-03 (×4): qty 20

## 2022-09-03 MED ORDER — FLUOXETINE HCL 20 MG PO CAPS: 60.0000 mg | ORAL_CAPSULE | Freq: Every morning | ORAL | Status: DC

## 2022-09-03 MED ORDER — ORAL CARE MOUTH RINSE: 15.0000 mL | OROMUCOSAL | Status: DC | PRN

## 2022-09-03 MED ORDER — CLONAZEPAM 0.5 MG PO TABS
0.5000 mg | ORAL_TABLET | Freq: Two times a day (BID) | ORAL | Status: DC
  Administered 2022-09-03 – 2022-09-11 (×16): 0.5 mg via ORAL
  Filled 2022-09-03 (×16): qty 1

## 2022-09-03 MED ORDER — DULOXETINE HCL 60 MG PO CPEP: 60.0000 mg | ORAL_CAPSULE | Freq: Every day | ORAL | Status: DC

## 2022-09-03 MED ORDER — ADULT MULTIVITAMIN W/MINERALS CH
1.0000 | ORAL_TABLET | Freq: Every day | ORAL | Status: DC
  Administered 2022-09-03 – 2022-09-09 (×7): 1 via ORAL
  Filled 2022-09-03 (×7): qty 1

## 2022-09-03 MED ORDER — SPIRONOLACTONE 100 MG PO TABS
100.0000 mg | ORAL_TABLET | Freq: Every day | ORAL | Status: DC
  Administered 2022-09-03 – 2022-09-04 (×2): 100 mg via ORAL
  Filled 2022-09-03 (×2): qty 1

## 2022-09-03 MED ORDER — FOLIC ACID 1 MG PO TABS
1.0000 mg | ORAL_TABLET | Freq: Every day | ORAL | Status: DC
  Administered 2022-09-03 – 2022-09-09 (×7): 1 mg via ORAL
  Filled 2022-09-03 (×7): qty 1

## 2022-09-03 MED ORDER — GABAPENTIN 600 MG PO TABS
300.0000 mg | ORAL_TABLET | Freq: Three times a day (TID) | ORAL | Status: DC
  Administered 2022-09-03 – 2022-09-06 (×11): 300 mg via ORAL
  Filled 2022-09-03 (×6): qty 1
  Filled 2022-09-03: qty 0.5
  Filled 2022-09-03 (×5): qty 1

## 2022-09-03 NOTE — Progress Notes (Signed)
Pharmacy Antibiotic Note  Briana Sweeney is a 51 y.o. female admitted on 09/02/2022 with cellulitis.  Pharmacy has been consulted for vanco dosing dosing.  Plan: Vancomycin 1000 mg IV every 12 hours. eAUC ~535 mcg*hr/mL (utilized scr of 0.71 mg/dl)   Height: 5' (152.4 cm) Weight: 62.9 kg (138 lb 10.7 oz) IBW/kg (Calculated) : 45.5  Temp (24hrs), Avg:98.9 F (37.2 C), Min:98.1 F (36.7 C), Max:101.1 F (38.4 C)  Recent Labs  Lab 09/02/22 1430 09/02/22 1432 09/03/22 0328  WBC 4.2  --   --   CREATININE 0.59  --  0.71  LATICACIDVEN  --  1.5  --     Estimated Creatinine Clearance: 69.7 mL/min (by C-G formula based on SCr of 0.71 mg/dL).    Allergies  Allergen Reactions   Other Other (See Comments)    Platelets: Rx chest pain, tremors, body aches  Patient stated that she receives platelet injection every Wednesday     Antimicrobials this admission: Cfepime 12/25 >> 12/25 Vanc 12/25 >>  CTX 12/26 >>   Microbiology results: 12/25 BCx: ip   Thank you for allowing pharmacy to be a part of this patient's care.  Vaughan Basta BS, PharmD, BCPS Clinical Pharmacist 09/03/2022 11:57 AM  Contact: [phone] after 3 PM  "Be curious, not judgmental..." -Jamal Maes

## 2022-09-03 NOTE — Progress Notes (Signed)
Pt believes she has been shot and that's why she's in the hospital. Despite this delusion, she is refusing to take any meds, including IV abx. MD aware.  She also refuses to provide a urine sample despite endorsing urge to void. She refuses MRSA swab. She continues to call out and ask for the police. Security has been asked to round on the pt when their schedule allows to hopefully encourage compliance.  Will continue to encourage meds, ADLs, compliance.
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
## 2022-09-04 ENCOUNTER — Inpatient Hospital Stay (HOSPITAL_COMMUNITY): Payer: No Typology Code available for payment source

## 2022-09-04 DIAGNOSIS — L03115 Cellulitis of right lower limb: Secondary | ICD-10-CM | POA: Diagnosis not present

## 2022-09-04 DIAGNOSIS — E876 Hypokalemia: Secondary | ICD-10-CM

## 2022-09-04 DIAGNOSIS — L03119 Cellulitis of unspecified part of limb: Secondary | ICD-10-CM

## 2022-09-04 DIAGNOSIS — R410 Disorientation, unspecified: Secondary | ICD-10-CM

## 2022-09-04 HISTORY — PX: IR PARACENTESIS: IMG2679

## 2022-09-04 LAB — BASIC METABOLIC PANEL
Anion gap: 8 (ref 5–15)
BUN: <5 mg/dL — ABNORMAL LOW (ref 6–20)
CO2: 27 mmol/L (ref 22–32)
Calcium: 8 mg/dL — ABNORMAL LOW (ref 8.9–10.3)
Chloride: 100 mmol/L (ref 98–111)
Creatinine, Ser: 0.7 mg/dL (ref 0.44–1.00)
GFR, Estimated: >60 mL/min (ref >60)
Glucose, Bld: 88 mg/dL (ref 70–99)
Potassium: 3.7 mmol/L (ref 3.5–5.1)
Sodium: 135 mmol/L (ref 135–145)

## 2022-09-04 LAB — CBC
HCT: 37.6 % (ref 36.0–46.0)
Hemoglobin: 12.9 g/dL (ref 12.0–15.0)
MCH: 30.1 pg (ref 26.0–34.0)
MCHC: 34.3 g/dL (ref 30.0–36.0)
MCV: 87.6 fL (ref 80.0–100.0)
Platelets: 105 10*3/uL — ABNORMAL LOW (ref 150–400)
RBC: 4.29 MIL/uL (ref 3.87–5.11)
RDW: 17.5 % — ABNORMAL HIGH (ref 11.5–15.5)
WBC: 3.9 10*3/uL — ABNORMAL LOW (ref 4.0–10.5)
nRBC: 0 % (ref 0.0–0.2)

## 2022-09-04 LAB — MAGNESIUM: Magnesium: 1.6 mg/dL — ABNORMAL LOW (ref 1.7–2.4)

## 2022-09-04 LAB — GRAM STAIN

## 2022-09-04 LAB — FOLATE: Folate: 26.2 ng/mL (ref >5.9)

## 2022-09-04 LAB — MRSA NEXT GEN BY PCR, NASAL: MRSA by PCR Next Gen: NOT DETECTED

## 2022-09-04 LAB — TSH: TSH: 2.538 u[IU]/mL (ref 0.350–4.500)

## 2022-09-04 LAB — LACTATE DEHYDROGENASE, PLEURAL OR PERITONEAL FLUID: LD, Fluid: <25 U/L — ABNORMAL HIGH (ref 3–23)

## 2022-09-04 LAB — PROTEIN, PLEURAL OR PERITONEAL FLUID: Total protein, fluid: <3 g/dL

## 2022-09-04 LAB — VITAMIN B12: Vitamin B-12: 2150 pg/mL — ABNORMAL HIGH (ref 180–914)

## 2022-09-04 MED ORDER — ALBUMIN HUMAN 25 % IV SOLN: 12.5000 g | Freq: Once | INTRAVENOUS | Status: DC

## 2022-09-04 MED ORDER — IBUPROFEN 400 MG PO TABS
400.0000 mg | ORAL_TABLET | Freq: Four times a day (QID) | ORAL | Status: DC | PRN
  Administered 2022-09-05: 400 mg via ORAL
  Filled 2022-09-04: qty 1

## 2022-09-04 MED ORDER — DULOXETINE HCL 60 MG PO CPEP
60.0000 mg | ORAL_CAPSULE | Freq: Every day | ORAL | Status: DC
  Administered 2022-09-04 – 2022-09-10 (×7): 60 mg via ORAL
  Filled 2022-09-04 (×8): qty 1

## 2022-09-04 MED ORDER — MAGNESIUM SULFATE 2 GM/50ML IV SOLN
2.0000 g | Freq: Once | INTRAVENOUS | Status: AC
  Administered 2022-09-04: 2 g via INTRAVENOUS
  Filled 2022-09-04: qty 50

## 2022-09-04 MED ORDER — LIDOCAINE HCL 1 % IJ SOLN
INTRAMUSCULAR | Status: AC
  Administered 2022-09-04: 10 mL
  Filled 2022-09-04: qty 20

## 2022-09-04 NOTE — Consult Note (Signed)
  Psychiatric provider attempted to assess patient today.  She did have her pastor and friend of the family at bedside.  Patient was able to provide consent to proceed with psychiatric evaluation with friends present.  Patient is very difficult to arouse, and lethargic on exam.  As per her family/friend "she just returned from having fluid drawn off her belly and she maybe tired. "  Patient's orientation and suspected delirium do appear to be improving.  Chart review shows patient did have paracentesis of left lower quadrant performed, and which she tolerated well.  Unclear if patient received medications for pain or anesthesia.  Continue Abilify 15 mg p.o. daily, Klonopin 0.5 mg p.o. twice daily, duloxetine 60 mg p.o. nightly, gabapentin 300 mg p.o. 3 times daily.  As of note she has not received any as needed Haldol.  Psychiatry consult service will continue to follow.

## 2022-09-04 NOTE — Procedures (Signed)
PROCEDURE SUMMARY:  Successful US guided diagnostic and therapeutic paracentesis from LLQ.  Yielded 1.3 of pale, yellow fluid.  No immediate complications.  Pt tolerated well.   Specimen was sent for labs.  EBL < 1 mL  Tyson Alias, AGNP 09/04/2022 12:19 PM

## 2022-09-04 NOTE — Progress Notes (Signed)
   09/04/22 2322  Assess: MEWS Score  BP (!) 86/58  MAP (mmHg) 67  Pulse Rate 70  Assess: MEWS Score  MEWS Temp 0  MEWS Systolic 1  MEWS Pulse 0  MEWS RR 0  MEWS LOC 0  MEWS Score 1  MEWS Score Color Green  Provider Notification  Provider Name/Title Mapp, MD  Date Provider Notified 09/04/22  Time Provider Notified 2328  Method of Notification Page  Notification Reason Other (Comment) (Soft BP since admit. Mid 80s to low 90s. Somnolent, but easily rouses with verbal stimuli.)  Provider response Other (Comment) (waiting for response)  Assess: SIRS CRITERIA  SIRS Temperature  0  SIRS Pulse 0  SIRS Respirations  0  SIRS WBC 0  SIRS Score Sum  0
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
## 2022-09-05 ENCOUNTER — Inpatient Hospital Stay: Payer: No Typology Code available for payment source

## 2022-09-05 DIAGNOSIS — L03115 Cellulitis of right lower limb: Secondary | ICD-10-CM | POA: Diagnosis not present

## 2022-09-05 DIAGNOSIS — M869 Osteomyelitis, unspecified: Secondary | ICD-10-CM | POA: Diagnosis not present

## 2022-09-05 LAB — BASIC METABOLIC PANEL
Anion gap: 11 (ref 5–15)
BUN: <5 mg/dL — ABNORMAL LOW (ref 6–20)
CO2: 22 mmol/L (ref 22–32)
Calcium: 7.9 mg/dL — ABNORMAL LOW (ref 8.9–10.3)
Chloride: 100 mmol/L (ref 98–111)
Creatinine, Ser: 1.09 mg/dL — ABNORMAL HIGH (ref 0.44–1.00)
GFR, Estimated: >60 mL/min (ref >60)
Glucose, Bld: 111 mg/dL — ABNORMAL HIGH (ref 70–99)
Potassium: 4.2 mmol/L (ref 3.5–5.1)
Sodium: 133 mmol/L — ABNORMAL LOW (ref 135–145)

## 2022-09-05 LAB — VANCOMYCIN, PEAK: Vancomycin Pk: 56 ug/mL (ref 30–40)

## 2022-09-05 LAB — CBC
HCT: 39.1 % (ref 36.0–46.0)
Hemoglobin: 12.3 g/dL (ref 12.0–15.0)
MCH: 29.4 pg (ref 26.0–34.0)
MCHC: 31.5 g/dL (ref 30.0–36.0)
MCV: 93.3 fL (ref 80.0–100.0)
Platelets: 82 10*3/uL — ABNORMAL LOW (ref 150–400)
RBC: 4.19 MIL/uL (ref 3.87–5.11)
RDW: 17.6 % — ABNORMAL HIGH (ref 11.5–15.5)
WBC: 8 10*3/uL (ref 4.0–10.5)
nRBC: 0 % (ref 0.0–0.2)

## 2022-09-05 LAB — ALBUMIN, PLEURAL OR PERITONEAL FLUID: Albumin, Fluid: <1.5 g/dL

## 2022-09-05 LAB — PROTIME-INR
INR: 1.5 — ABNORMAL HIGH (ref 0.8–1.2)
Prothrombin Time: 18.1 seconds — ABNORMAL HIGH (ref 11.4–15.2)

## 2022-09-05 LAB — BODY FLUID CELL COUNT WITH DIFFERENTIAL: Total Nucleated Cell Count, Fluid: 2 cu mm (ref 0–1000)

## 2022-09-05 LAB — ALBUMIN: Albumin: 1.9 g/dL — ABNORMAL LOW (ref 3.5–5.0)

## 2022-09-05 LAB — VANCOMYCIN, TROUGH: Vancomycin Tr: 26 ug/mL (ref 15–20)

## 2022-09-05 LAB — LACTIC ACID, PLASMA: Lactic Acid, Venous: 1.5 mmol/L (ref 0.5–1.9)

## 2022-09-05 LAB — CYTOLOGY - NON PAP

## 2022-09-05 MED ORDER — FUROSEMIDE 20 MG PO TABS: 20.0000 mg | ORAL_TABLET | Freq: Every day | ORAL | Status: DC

## 2022-09-05 MED ORDER — ALBUMIN HUMAN 25 % IV SOLN
25.0000 g | Freq: Once | INTRAVENOUS | Status: AC
  Administered 2022-09-05: 25 g via INTRAVENOUS
  Filled 2022-09-05: qty 100

## 2022-09-05 MED ORDER — MIDODRINE HCL 5 MG PO TABS: 10.0000 mg | ORAL_TABLET | Freq: Three times a day (TID) | ORAL | Status: DC

## 2022-09-05 MED ORDER — RIFAXIMIN 550 MG PO TABS
550.0000 mg | ORAL_TABLET | Freq: Two times a day (BID) | ORAL | Status: DC
  Administered 2022-09-05 – 2022-09-10 (×11): 550 mg via ORAL
  Filled 2022-09-05 (×11): qty 1

## 2022-09-05 MED ORDER — LACTULOSE 10 GM/15ML PO SOLN
20.0000 g | Freq: Three times a day (TID) | ORAL | Status: DC
  Administered 2022-09-05 – 2022-09-07 (×7): 20 g via ORAL
  Filled 2022-09-05 (×7): qty 30

## 2022-09-05 MED ORDER — LACTATED RINGERS IV BOLUS
250.0000 mL | Freq: Once | INTRAVENOUS | Status: AC
  Administered 2022-09-05: 250 mL via INTRAVENOUS

## 2022-09-05 MED ORDER — LACTATED RINGERS IV SOLN: INTRAVENOUS | Status: AC

## 2022-09-05 MED ORDER — MIDODRINE HCL 5 MG PO TABS
10.0000 mg | ORAL_TABLET | Freq: Three times a day (TID) | ORAL | Status: DC
  Administered 2022-09-05 – 2022-09-06 (×5): 10 mg via ORAL
  Filled 2022-09-05 (×5): qty 2

## 2022-09-05 MED ORDER — VANCOMYCIN HCL IN DEXTROSE 1-5 GM/200ML-% IV SOLN
1000.0000 mg | INTRAVENOUS | Status: DC
  Filled 2022-09-05: qty 200

## 2022-09-05 MED ORDER — SPIRONOLACTONE 25 MG PO TABS: 50.0000 mg | ORAL_TABLET | Freq: Every day | ORAL | Status: DC

## 2022-09-05 MED ORDER — THIAMINE MONONITRATE 100 MG PO TABS
100.0000 mg | ORAL_TABLET | Freq: Every day | ORAL | Status: DC
  Administered 2022-09-05 – 2022-09-09 (×5): 100 mg via ORAL
  Filled 2022-09-05 (×5): qty 1

## 2022-09-05 NOTE — Evaluation (Signed)
Physical Therapy Evaluation Patient Details Name: Briana Sweeney MRN: [id number] DOB: 1971-05-25 Today's Date: 09/05/2022  History of Present Illness  Pt presents for bil leg pain. PMH includes ETOH abuse, polysubstance abuse, cirrhosis, GRD, seizures, anxiety, depression, hepatitis C, CKD. Recent R BKA 2 months ago. She reports that she has had L leg pain swelling and redness that has been worsening over the past week. She states that the area of her R residual limb pain did previously resolve but has come back. Pain was severe on presentation to ED on 12/25. Pt uses W/C for mobility at baseline  Clinical Impression  Pt presents below baseline of function. Currently pt is at a high risk for falls with all functional mobility requiring Min A for bed mobility and transfers. Due to pt current medical condition, available help at home and current functional status recommend skilled physical therapy services in SNF setting on discharge from acute care hospital setting in order to decrease risk for falls, injury, immobility, worsening skin integrity and re-hospitalization. Pt demonstrates no signs/symptoms of cardiac/respiratory distress throughout session.        Recommendations for follow up therapy are one component of a multi-disciplinary discharge planning process, led by the attending physician.  Recommendations may be updated based on patient status, additional functional criteria and insurance authorization.  Follow Up Recommendations Skilled nursing-short term rehab (<3 hours/day) Can patient physically be transported by private vehicle: No    Assistance Recommended at Discharge Frequent or constant Supervision/Assistance  Patient can return home with the following  A little help with walking and/or transfers;Assist for transportation;Help with stairs or ramp for entrance;Assistance with cooking/housework    Equipment Recommendations Other (comment) (Pt has equipment at home. Defer to  post acute)  Recommendations for Other Services       Functional Status Assessment Patient has had a recent decline in their functional status and demonstrates the ability to make significant improvements in function in a reasonable and predictable amount of time.     Precautions / Restrictions Precautions Precautions: Fall Restrictions Weight Bearing Restrictions: No      Mobility  Bed Mobility Overal bed mobility: Needs Assistance Bed Mobility: Supine to Sit     Supine to sit: Min assist, HOB elevated       Patient Response: Flat affect  Transfers Overall transfer level: Needs assistance Equipment used: Rolling walker (2 wheels) Transfers: Sit to/from Stand, Bed to chair/wheelchair/BSC Sit to Stand: Min assist   Step pivot transfers: Min assist       General transfer comment: Pt has tremors in the LLE due to weakness during transfers and requires Min A for forward COM over BOS in order to prevent posterior fall.    Ambulation/Gait Ambulation/Gait assistance: Min assist Gait Distance (Feet): 2 Feet Assistive device: Rolling walker (2 wheels) Gait Pattern/deviations: Step-to pattern   Gait velocity interpretation: <1.31 ft/sec, indicative of household ambulator   General Gait Details: Pt has BKA on the R and has hop to AD pattern with RW. Pt demonstrates posterior lean requiring Min a  Stairs Stairs:  (not applicable)          Wheelchair Mobility    Modified Rankin (Stroke Patients Only)       Balance Overall balance assessment: Needs assistance Sitting-balance support: No upper extremity supported, Single extremity supported, Feet unsupported, Feet supported Sitting balance-Leahy Scale: Fair     Standing balance support: Bilateral upper extremity supported, During functional activity, Reliant on assistive device for balance Standing balance-Leahy Scale:  Poor           Pertinent Vitals/Pain Pain Assessment Pain Assessment: Faces Faces  Pain Scale: Hurts a little bit Pain Intervention(s): Limited activity within patient's tolerance, Monitored during session    [place] expects to be discharged to:: Private residence Living Arrangements: Alone Available Help at Discharge: Neighbor;Available PRN/intermittently Type of Home: Apartment Home Access: Elevator       Home Layout: One level Home Equipment: Grab bars - tub/shower;Grab bars - toilet;Wheelchair - manual;Tub bench      Prior Function Prior Level of Function : Independent/Modified Independent             Mobility Comments: pt reports 2 falls since last hospitalization       Hand Dominance   Dominant Hand: Right    Extremity/Trunk Assessment   Upper Extremity Assessment Upper Extremity Assessment: Defer to OT evaluation    Lower Extremity Assessment Lower Extremity Assessment: Generalized weakness;RLE deficits/detail RLE Deficits / Details: R BKA    Cervical / Trunk Assessment Cervical / Trunk Assessment: Normal  Communication   Communication: No difficulties  Cognition Arousal/Alertness: Lethargic Behavior During Therapy: Flat affect Overall Cognitive Status: Within Functional Limits for tasks assessed          General Comments General comments (skin integrity, edema, etc.): Pt has cellulitis in the L LE with desquamation, slight discoloration. open area that has scabbed over on the R residual limb.        Assessment/Plan    PT Assessment Patient needs continued PT services  PT Problem List Decreased strength;Decreased balance;Decreased mobility;Decreased activity tolerance;Decreased skin integrity       PT Treatment Interventions DME instruction;Functional mobility training;Balance training;Patient/family education;Gait training;Therapeutic activities;Neuromuscular re-education;Stair training;Therapeutic exercise;Cognitive remediation;Wheelchair mobility training;Manual techniques    PT Goals (Current goals  can be found in the Care Plan section)  Acute Rehab PT Goals Patient Stated Goal: Pt would like to go home PT Goal Formulation: With patient Time For Goal Achievement: 09/19/22 Potential to Achieve Goals: Fair    Frequency Min 2X/week     Co-evaluation PT/OT/SLP Co-Evaluation/Treatment: Yes Reason for Co-Treatment: To address functional/ADL transfers PT goals addressed during session: Mobility/safety with mobility;Balance;Proper use of DME         AM-PAC PT "6 Clicks" Mobility  Outcome Measure Help needed turning from your back to your side while in a flat bed without using bedrails?: A Little Help needed moving from lying on your back to sitting on the side of a flat bed without using bedrails?: A Little Help needed moving to and from a bed to a chair (including a wheelchair)?: A Little Help needed standing up from a chair using your arms (e.g., wheelchair or bedside chair)?: A Little Help needed to walk in hospital room?: Total Help needed climbing 3-5 steps with a railing? : Total 6 Click Score: 14    End of Session   Activity Tolerance: Patient tolerated treatment well Patient left: in chair;with call bell/phone within reach Nurse Communication: Mobility status PT Visit Diagnosis: Other abnormalities of gait and mobility (R26.89);Muscle weakness (generalized) (M62.81);History of falling (Z91.81)    Time: 5625-6389 PT Time Calculation (min) (ACUTE ONLY): 30 min   Charges:   PT Evaluation $PT Eval Low Complexity: [place], DPT, CLT  Acute Rehabilitation Services Office: [phone] (Secure chat preferred)   Ander Purpura 09/05/2022, 10:44 AM

## 2022-09-05 NOTE — Progress Notes (Signed)
   09/04/22 2322  Vitals  BP (!) 86/58  MAP (mmHg) 67  BP Location Left Arm  BP Method Automatic  Patient Position (if appropriate) Lying  Pulse Rate 70  Pulse Rate Source Dinamap  MEWS COLOR  MEWS Score Color Green  MEWS Score  MEWS Temp 0  MEWS Systolic 1  MEWS Pulse 0  MEWS RR 0  MEWS LOC 0  MEWS Score 1  Provider Notification  Provider Name/Title Mapp, MD  Date Provider Notified 09/04/22  Time Provider Notified 2328  Method of Notification Page  Notification Reason Other (Comment) (Soft BP since admit. Mid 80s to low 90s. Somnolent, but easily rouses with verbal stimuli.)  Provider response Other (Comment) (MD at bedside to assess patient. New order for Albumin, SCD, and EKG noted.)  Date of Provider Response 09/05/22  Time of Provider Response 0010   Albumin infusing. SCD applied. EKG done showing NSR -MD notfied.

## 2022-09-05 NOTE — Consult Note (Signed)
Reason for Consult:Right tibia osteo Referring Physician: Aldine Contes Time called: 0865 Time at bedside: Briana Sweeney is an 51 y.o. female.  HPI: [place] was admitted 2d ago with BLE cellulitis. Workup showed osteo in her distal tibia stump and orthopedic surgery was consulted. She has had pain for about a week but denies fevers, chills, sweats, N/V.  Past Medical History:  Diagnosis Date   Abscess of bursa of left elbow 09/23/2018   Acute hyperactive alcohol withdrawal delirium ([place]) 10/09/2017   Acute low back pain with bilateral sciatica    Acute metabolic encephalopathy 78/46/9629   Acute on chronic pancreatitis ([place]) 04/04/2021   AKI (acute kidney injury) ([place]) 04/04/2021   Alcohol withdrawal syndrome with complication ([place]) 52/84/1324   Alcohol-induced acute pancreatitis    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[place]) 12/05/2018   Ascites due to alcoholic cirrhosis ([place])    Bipolar affective disorder ([place])    With anxiety features   Bunionette of right foot 11/2019   Cirrhosis of liver ([place])    Due to alcohol and hepatitis C   Cocaine dependence without complication ([place]) 40/06/2724   Decompensated hepatic cirrhosis ([place]) 07/23/2017   Depression with anxiety    Epistaxis 01/20/2019   Erysipelas 09/13/2018   Esophageal 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 ([place])    Esophageal varices without bleeding (H[facility])    ETOHism (HCC)    GERD (gastroesophageal reflux disease)    Head injury    Hematemesis 02/10/2018   Hepatitis C 2018   hepatitis c and alcohol related hepatitis   Heroin abuse ([place])    History of blood transfusion    "blood doesn't clot; I fell down and had to have a transfusion"   History of kidney stones    Menopause 2016   Migraine    "when I get really stressed" (09/01/2017)   Neuropathy 10/27/2018   Olecranon bursitis of left elbow    Other mixed anxiety disorders 10/27/2018   Overdose 08/22/2018   Pancytopenia ([place]) 07/23/2017   Polysubstance abuse ([place]) 04/08/2018    Portal hypertension ([place]) 03/18/2018   S/P foot surgery, right 11/2019   Schizophrenia ([place])    Seizures ([place])    "when I run out of my RX; lots recently" (09/01/2017)   Suicidal ideation    Thrombocytopenia ([place])    Trichimoniasis 10/30/2017   Upper GI bleed 02/10/2018   Urinary urgency 05/2020   UTI (urinary tract infection) 06/02/2017    Past Surgical History:  Procedure Laterality Date   AMPUTATION Right 07/02/2022   Procedure: RIGHT BELOW KNEE AMPUTATION;  Surgeon: Newt Minion, MD;  Location: [facility];  Service: Orthopedics;  Laterality: Right;   APPLICATION OF WOUND VAC Right 07/02/2022   Procedure: APPLICATION OF WOUND VAC;  Surgeon: Newt Minion, MD;  Location: [facility];  Service: Orthopedics;  Laterality: Right;   BUNIONECTOMY     ESOPHAGEAL BANDING  06/26/2021   Procedure: ESOPHAGEAL BANDING;  Surgeon: Jackquline Denmark, MD;  Location: [facility] ENDOSCOPY;  Service: Endoscopy;;   ESOPHAGOGASTRODUODENOSCOPY N/A 09/03/2017   Procedure: ESOPHAGOGASTRODUODENOSCOPY (EGD);  Surgeon: Doran Stabler, MD;  Location: [facility];  Service: Gastroenterology;  Laterality: N/A;   ESOPHAGOGASTRODUODENOSCOPY N/A 02/01/2022   Procedure: ESOPHAGOGASTRODUODENOSCOPY (EGD);  Surgeon: Clarene Essex, MD;  Location: [facility];  Service: Gastroenterology;  Laterality: N/A;   ESOPHAGOGASTRODUODENOSCOPY (EGD) WITH PROPOFOL N/A 06/26/2021   Procedure: ESOPHAGOGASTRODUODENOSCOPY (EGD) WITH PROPOFOL;  Surgeon: Jackquline Denmark, MD;  Location: [facility] ENDOSCOPY;  Service: Endoscopy;  Laterality: N/A;   FINGER FRACTURE SURGERY Left    "  shattered my pinky"   FRACTURE SURGERY     I & D EXTREMITY Left 09/18/2018   Procedure: IRRIGATION AND DEBRIDEMENT EXTREMITY;  Surgeon: Leanora Cover, MD;  Location: [facility];  Service: Orthopedics;  Laterality: Left;   IR PARACENTESIS  07/23/2017   IR PARACENTESIS  07/2017   "did it twice in the same week" (09/01/2017)   IR PARACENTESIS  06/26/2021   IR PARACENTESIS  07/10/2022   IR  PARACENTESIS  09/04/2022   SHOULDER OPEN ROTATOR CUFF REPAIR Right    TOOTH EXTRACTION  08/2019   TUBAL LIGATION     VAGINAL HYSTERECTOMY      Family History  Problem Relation Age of Onset   Lung cancer Mother 1   Alcohol abuse Mother    Throat cancer Father 42    Social History:  reports that she has never smoked. She has never used smokeless tobacco. She reports current alcohol use of about 6.0 standard drinks of alcohol per week. She reports that she does not currently use drugs.  Allergies:  Allergies  Allergen Reactions   Other Other (See Comments)    Platelets: Rx chest pain, tremors, body aches  Patient stated that she receives platelet injection every Wednesday     Medications: I have reviewed the patient's current medications.  Results for orders placed or performed during the hospital encounter of 09/02/22 (from the past 48 hour(s))  Basic metabolic panel     Status: Abnormal   Collection Time: 09/04/22  3:13 AM  Result Value Ref Range   Sodium 135 135 - 145 mmol/L   Potassium 3.7 3.5 - 5.1 mmol/L   Chloride 100 98 - 111 mmol/L   CO2 27 22 - 32 mmol/L   Glucose, Bld 88 70 - 99 mg/dL    Comment: Glucose reference range applies only to samples taken after fasting for at least 8 hours.   BUN <5 (L) 6 - 20 mg/dL   Creatinine, Ser 0.70 0.44 - 1.00 mg/dL   Calcium 8.0 (L) 8.9 - 10.3 mg/dL   GFR, Estimated >60 >60 mL/min    Comment: (NOTE) Calculated using the CKD-EPI Creatinine Equation (2021)    Anion gap 8 5 - 15    Comment: Performed at [facility] [address], [place], [place] [address]  CBC     Status: Abnormal   Collection Time: 09/04/22  3:13 AM  Result Value Ref Range   WBC 3.9 (L) 4.0 - 10.5 K/uL   RBC 4.29 3.87 - 5.11 MIL/uL   Hemoglobin 12.9 12.0 - 15.0 g/dL   HCT 37.6 36.0 - 46.0 %   MCV 87.6 80.0 - 100.0 fL   MCH 30.1 26.0 - 34.0 pg   MCHC 34.3 30.0 - 36.0 g/dL   RDW 17.5 (H) 11.5 - 15.5 %   Platelets 105 (L) 150 - 400 K/uL   nRBC  0.0 0.0 - 0.2 %    Comment: Performed at [facility], [place] [address] [place], [place] [address]  Magnesium     Status: Abnormal   Collection Time: 09/04/22  3:13 AM  Result Value Ref Range   Magnesium 1.6 (L) 1.7 - 2.4 mg/dL    Comment: Performed at [facility] [address] [place], [place] [address]  Folate     Status: None   Collection Time: 09/04/22  3:13 AM  Result Value Ref Range   Folate 26.2 >5.9 ng/mL    Comment: Performed at [facility], [address], [place], [place] [address]  TSH     Status: None   Collection Time: 09/04/22  3:13 AM  Result Value Ref Range   TSH 2.538 0.350 - 4.500 uIU/mL    Comment: Performed by a 3rd Generation assay with a functional sensitivity of <=0.01 uIU/mL. Performed at [facility], [place] [address] [place], [place] [address]   Vitamin B12     Status: Abnormal   Collection Time: 09/04/22  3:13 AM  Result Value Ref Range   Vitamin B-12 2,150 (H) 180 - 914 pg/mL    Comment: (NOTE) This assay is not validated for testing neonatal or myeloproliferative syndrome specimens for Vitamin B12 levels. Performed at [facility], [place] [address], [place], [place] [address]   MRSA Next Gen by PCR, Nasal     Status: None   Collection Time: 09/04/22  7:36 AM   Specimen: Nasal Mucosa; Nasal Swab  Result Value Ref Range   MRSA by PCR Next Gen NOT DETECTED NOT DETECTED    Comment: (NOTE) The GeneXpert MRSA Assay (FDA approved for NASAL specimens only), is one component of a comprehensive MRSA colonization surveillance program. It is not intended to diagnose MRSA infection nor to guide or monitor treatment for MRSA infections. Test performance is not FDA approved in patients less than 59 years old. Performed at [facility], [place] [address], [place], [place] [address]   Culture, body fluid w Gram Stain-bottle     Status: None (Preliminary result)   Collection Time: 09/04/22 12:26 PM   Specimen: Peritoneal  Washings  Result Value Ref Range   Specimen Description PERITONEAL    Special Requests NONE    Culture      NO GROWTH < 24 HOURS Performed at [facility], [place] [address] [place], [place] [address]    Report Status PENDING   Gram stain     Status: None   Collection Time: 09/04/22 12:26 PM   Specimen: Peritoneal Washings  Result Value Ref Range   Specimen Description PERITONEAL    Special Requests NONE    Gram Stain      WBC PRESENT, PREDOMINANTLY MONONUCLEAR NO ORGANISMS SEEN CYTOSPIN SMEAR Performed at [facility], [place] [address], [place], [place] [address]    Report Status 09/04/2022 FINAL   Body fluid cell count with differential     Status: None   Collection Time: 09/04/22 12:26 PM  Result Value Ref Range   Fluid Type-FCT Peritoneal    Color, Fluid STRAW    Appearance, Fluid CLEAR CLEAR   Total Nucleated Cell Count, Fluid 2 0 - 1,000 cu mm   Other Cells, Fluid TOO FEW TO COUNT, SMEAR AVAILABLE FOR REVIEW %    Comment: RARE LYMPHS PRESENT Performed at [facility], [place] [address], [place], [place] [address]   Albumin, pleural or peritoneal fluid      Status: None   Collection Time: 09/04/22 12:26 PM  Result Value Ref Range   Albumin, Fluid <1.5 g/dL    Comment: (NOTE) No normal range established for this test Results should be evaluated in conjunction with serum values    Fluid Type-FALB PERITONEAL CAVITY     Comment: Performed at [facility] [address] [place], [place] [address]  CBC     Status: Abnormal   Collection Time: 09/05/22  1:12 AM  Result Value Ref Range   WBC 8.0 4.0 - 10.5 K/uL  RBC 4.19 3.87 - 5.11 MIL/uL   Hemoglobin 12.3 12.0 - 15.0 g/dL   HCT 39.1 36.0 - 46.0 %   MCV 93.3 80.0 - 100.0 fL   MCH 29.4 26.0 - 34.0 pg   MCHC 31.5 30.0 - 36.0 g/dL   RDW 17.6 (H) 11.5 - 15.5 %   Platelets 82 (L) 150 - 400 K/uL    Comment: SPECIMEN CHECKED FOR CLOTS Immature Platelet Fraction may be clinically indicated,  consider ordering this additional test [id number] REPEATED TO VERIFY PLATELET COUNT CONFIRMED BY SMEAR    nRBC 0.0 0.0 - 0.2 %    Comment: Performed at [facility], [place] [address] [place], [place] [address]  Basic metabolic panel     Status: Abnormal   Collection Time: 09/05/22  1:12 AM  Result Value Ref Range   Sodium 133 (L) 135 - 145 mmol/L   Potassium 4.2 3.5 - 5.1 mmol/L   Chloride 100 98 - 111 mmol/L   CO2 22 22 - 32 mmol/L   Glucose, Bld 111 (H) 70 - 99 mg/dL    Comment: Glucose reference range applies only to samples taken after fasting for at least 8 hours.   BUN <5 (L) 6 - 20 mg/dL   Creatinine, Ser 1.09 (H) 0.44 - 1.00 mg/dL   Calcium 7.9 (L) 8.9 - 10.3 mg/dL   GFR, Estimated >60 >60 mL/min    Comment: (NOTE) Calculated using the CKD-EPI Creatinine Equation (2021)    Anion gap 11 5 - 15    Comment: Performed at [facility] [address], [place], [place] [address]  Albumin     Status: Abnormal   Collection Time: 09/05/22  1:12 AM  Result Value Ref Range   Albumin 1.9 (L) 3.5 - 5.0 g/dL    Comment: Performed at [facility] [address] [place], [place] [address]  Lactic acid, plasma     Status: None   Collection Time: 09/05/22  6:56 AM  Result Value Ref Range   Lactic Acid, Venous 1.5 0.5 - 1.9 mmol/L    Comment: Performed at [facility] [address] [place], [place] [address]  Protime-INR     Status: Abnormal   Collection Time: 09/05/22  6:56 AM  Result Value Ref Range   Prothrombin Time 18.1 (H) 11.4 - 15.2 seconds   INR 1.5 (H) 0.8 - 1.2    Comment: (NOTE) INR goal varies based on device and disease states. Performed at [facility], [place] [address] [place],  [address]    *Note: Due to a large number of results and/or encounters for the requested time period, some results have not been displayed. A complete set of results can be found in Results Review.    MR TIBIA FIBULA RIGHT WO CONTRAST  Result Date:  09/04/2022 CLINICAL DATA:  Soft tissue infection suspected. EXAM: MRI OF LOWER RIGHT EXTREMITY WITHOUT CONTRAST TECHNIQUE: Multiplanar, multisequence MR imaging of the right lower extremity was performed. No intravenous contrast was administered. COMPARISON:  Radiographs dated September 02, 2022 and MR knee examination performed earlier on the same date. FINDINGS: Bones/Joint/Cartilage There is marrow edema about the stump site concerning for osteomyelitis. Degenerative changes of the knee joint. Small knee joint effusion. Ligaments Intact Muscles and Tendons There is marked edema about the posterior calf muscles suggesting myositis. No intramuscular fluid collection or abscess. Soft tissues Skin wound about the anterior aspect of the stump site with marked surrounding subcutaneous  edema prominent about the anterior aspect of the stump. Skin thickening. No appreciable fluid collection. IMPRESSION: 1. Skin wound about the anterior aspect of the stump site with marked surrounding subcutaneous edema and skin thickening concerning for cellulitis. No appreciable fluid collection or abscess. 2. Marrow edema about the stump site concerning for osteomyelitis. 3. Marked edema about the posterior calf muscles suggesting myositis. No intramuscular fluid collection or abscess. Electronically Signed   By: Keane Police D.O.   On: 09/04/2022 23:00   MR KNEE RIGHT WO CONTRAST  Result Date: 09/04/2022 CLINICAL DATA:  Leg swelling.  Osteomyelitis suspected. EXAM: MRI OF THE RIGHT KNEE WITHOUT CONTRAST TECHNIQUE: Multiplanar, multisequence MR imaging of the knee was performed. No intravenous contrast was administered. COMPARISON:  Right knee radiographs 09/02/2022, right tibia and fibula radiographs 08/08/2022 FINDINGS: This patient is status post amputation of the right lower extremity to the level of the proximal to mid tibial and fibular diaphysis. Please note that the current knee MRI does not include the distal amputation  stump. Despite efforts by the technologist and patient, motion artifact is present on today's exam and could not be eliminated. This reduces exam sensitivity and specificity. MENISCI Medial meniscus: Within the limitations of moderate to high-grade patient motion artifact, there is truncation of the central third of the meniscal triangle of the mid transverse dimension of the posterior horn of the medial meniscus (sagittal series 5, image 23), likely chronic/degenerative radial tear. Lateral meniscus: Within limitations of motion artifact, no definite lateral meniscal tear is seen. LIGAMENTS Cruciates: The ACL and PCL are intact. Collaterals: The medial collateral ligament is intact. The fibular collateral ligament, biceps femoris tendon, iliotibial band, and popliteus tendon are intact. CARTILAGE Patellofemoral: No gross cartilage defect, though motion artifact limits evaluation. Medial: Focal full-thickness cartilage loss within the mid weight-bearing medial femoral condyle with minimal subchondral marrow edema (coronal series 7, image 10). Lateral:  No gross cartilage defect. Joint: Nojoint effusion. Normal Hoffa's fat pad. No plical thickening. The tibial tuberosity-trochlear groove distance measures 7 mm, within normal limits. Popliteal Fossa:  No Baker's cyst. Extensor Mechanism:  Intact quadriceps tendon and patellar tendon. Bones: No acute fracture or dislocation. Within the limitations of patient motion artifact, no cortical erosion is seen to indicate acute osteomyelitis. Other: Mild edema and swelling of the anterolateral knee subcutaneous fat. There is edema seen within the partially visualized proximal posterior tibial muscle and soleus muscle (axial series 2, images 14-30 of 30). IMPRESSION: 1. Status post amputation of the right lower extremity to the level of the proximal to mid tibial and fibular diaphysis. Please note that the current knee MRI does not include the distal amputation stump. 2. No  definite acute osteomyelitis is seen within the visualized region of the knee, excluding the patient's stump. 3. Focal full-thickness cartilage loss within the mid weight-bearing medial femoral condyle with minimal subchondral marrow edema. 4. Likely chronic/degenerative radial tear of the central third of the meniscal triangle of the mid transverse dimension of the posterior horn of the medial meniscus. 5. Edema within the posterior tibial and soleus musculature extending inferiorly. No walled-off abscess is visualized. Electronically Signed   By: Yvonne Kendall M.D.   On: 09/04/2022 17:27   IR Paracentesis  Result Date: 09/04/2022 INDICATION: Patient with history of cirrhosis and recurrent ascites. Request for diagnostic and therapeutic paracentesis. EXAM: ULTRASOUND GUIDED DIAGNOSTIC AND THERAPEUTIC LEFT LOWER QUADRANT PARACENTESIS MEDICATIONS: 10 mL 1 % lidocaine COMPLICATIONS: None immediate. PROCEDURE: Informed written consent was obtained from the patient  after a discussion of the risks, benefits and alternatives to treatment. A timeout was performed prior to the initiation of the procedure. Initial ultrasound scanning demonstrates a large amount of ascites within the left lower abdominal quadrant. The left lower abdomen was prepped and draped in the usual sterile fashion. 1% lidocaine was used for local anesthesia. Following this, a 19 gauge, 7-cm, Yueh catheter was introduced. After 1.3 L were removed, patient had slight drop in BP and procedure was stopped. An ultrasound image was saved for documentation purposes. The paracentesis was performed. The catheter was removed and a dressing was applied. The patient tolerated the procedure well without immediate post procedural complication. FINDINGS: A total of approximately 1.3 L of pale, yellow fluid was removed. Samples were sent to the laboratory as requested by the clinical team. IMPRESSION: Successful ultrasound-guided paracentesis yielding 1.3 liters  of peritoneal fluid. Read by: Narda Rutherford, AGNP-BC Electronically Signed   By: Miachel Roux M.D.   On: 09/04/2022 13:48   [place] ASCITES (ABDOMEN LIMITED)  Result Date: 09/03/2022 CLINICAL DATA:  Crohn's disease.  Evaluate for ascites. EXAM: LIMITED ABDOMEN ULTRASOUND FOR ASCITES TECHNIQUE: Limited ultrasound survey for ascites was performed in all four abdominal quadrants. COMPARISON:  None Available. FINDINGS: Four quadrant ascites is identified. Drainable pockets are identified everywhere but the right lower quadrant. IMPRESSION: Four quadrant ascites is identified. Drainable pockets are identified everywhere but the right lower quadrant. Electronically Signed   By: Dorise Bullion III M.D.   On: 09/03/2022 14:10    Review of Systems  Constitutional:  Negative for chills, diaphoresis and fever.  HENT:  Negative for ear discharge, ear pain, hearing loss and tinnitus.   Eyes:  Negative for photophobia and pain.  Respiratory:  Negative for cough and shortness of breath.   Cardiovascular:  Negative for chest pain.  Gastrointestinal:  Negative for abdominal pain, nausea and vomiting.  Genitourinary:  Negative for dysuria, flank pain, frequency and urgency.  Musculoskeletal:  Positive for arthralgias (Right stump, left lower leg). Negative for back pain, myalgias and neck pain.  Neurological:  Negative for dizziness and headaches.  Hematological:  Does not bruise/bleed easily.  Psychiatric/Behavioral:  The patient is not nervous/anxious.    Blood pressure (!) 97/52, pulse 74, temperature 97.9 F (36.6 C), temperature source Oral, resp. rate 16, height 5' (1.524 m), weight 62.9 kg, SpO2 96 %. Physical Exam Constitutional:      General: She is not in acute distress.    Appearance: She is well-developed. She is not diaphoretic.  HENT:     Head: Normocephalic and atraumatic.  Eyes:     General: No scleral icterus.       Right eye: No discharge.        Left eye: No discharge.      Conjunctiva/sclera: Conjunctivae normal.  Cardiovascular:     Rate and Rhythm: Normal rate and regular rhythm.  Pulmonary:     Effort: Pulmonary effort is normal. No respiratory distress.  Musculoskeletal:     Cervical back: Normal range of motion.     Comments: RLE No traumatic wounds or ecchymosis, erythema distal stump, incision C/D/I, s/p BKA  Mod diffuse TTP distal stump  No knee effusion  Knee stable to varus/ valgus and anterior/posterior stress  Skin:    General: Skin is warm and dry.  Neurological:     Mental Status: She is alert.  Psychiatric:        Mood and Affect: Mood normal.  Behavior: Behavior normal.     Assessment/Plan: Tibial osteo -- Dr. Sharol Given to evaluate in AM but will likely not do surgery acutely. I have made her NPO just in case though.    Lisette Abu, PA-C Orthopedic Surgery [phone] 09/05/2022, 12:26 PM

## 2022-09-05 NOTE — Consult Note (Signed)
[facility] Face-to-Face Psychiatry Consult   Reason for Consult:  acute psychosis Referring Physician:  Dr. Iona Coach Patient Identification: Briana Sweeney MRN:  [id number] Principal Diagnosis: Cellulitis of right lower extremity Diagnosis:  Principal Problem:   Cellulitis of right lower extremity Active Problems:   Delirium due to another medical condition   Osteomyelitis of right tibia [facility])   Total Time spent with patient: 45 minutes  Subjective/ID:   Briana Sweeney is a 51 y.o. female patient with history of alcohol use disorder, polysubstance abuse (cocaine, heroine), major depressive disorder vs substance-induced mood disorder vs bipolar disorder, cirrhosis, Hep C, seizures, CKD3, s/p right BKA, GERD, who was admitted for RLE/LLE cellulitis.  Psychiatry is consulted for acute psychosis.   HPI:  Her current presentation of altered mental status with psychosis is most consistent with prolonged acute delirium from her recent infection and surgery with prominent psychotic features. She initially made criteria for delirium based on waxing and waning altered mental status in the setting of a recent infection.  She has previously psychiatric history and has been on psychiatric medications listed below; was generally compliant with other medications per daughter. New onset psychosis in a pt in her 30s absent psychiatric history is unlikely to be 2/2 a primary psychotic spectrum diagnosis; we have provided recommendations throughout course to work up other causes of altered mental status below in addition to known recent encephalopathy from UTI.   On assessment today, patient denies any symptoms of depression or psychosis. Patient initially was feeling distressed by the intensity of her hallucinations and delusions towards her daughter. Starting from yesterday evening, the intensity of her delusions were less and patient currently recognizes them as not real. Patient states that she feels  comfortable with her daughter at home and confirmed this during two separate interactions with this provider. She denies any violent thoughts or active/passive homicidal thoughts. Denies any active or passive suicidal thoughts. Patient has not engaged in any violent or self harming behaviors.  Furthermore she is not displaying any acute psychosis, paranoia, delusions on examination.  She does not present with response to internal stimuli, external stimuli, or delusional thought disorder.  She declines hospitalization at this time and there is no indication for involuntary hospitalization. While patient has delirium, she has not demonstrated inability to care for herself (I.e. inability to eat, inability engage in hygiene practices) as a result of these delusions. Given this and her lack of suicidality/homicidality, she does not appear to be at imminent risk or others at this time and is appropriate for outpatient follow up.     Psych home meds per H&P  -Duloxetine 60 mg daily -Fluoxetine 20 mg (3 tablets in the morning) -Folic acid 1 mg daily -Abilify 10 mg daily -Klonopin 0.5 mg BID -Gabapentin 300 mg (2 capsules TID) -Trazodone 50 mg daily -Thiamine 100 mg daily  Head CT on 12/25 1. No acute intracranial hemorrhage or infarct. 2. Stable mild senescent change. 3. 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 (Mild periventricular white matter changes are present likely reflecting the sequela of small vessel ischemia).  Past Psychiatric History:  According to the chart review, she was seen by Dr. Einar Grad, last in May 2022 for a long hx of alcohol use disorder, polysubstance abuse (cocaine, heroin), and past diagnoses of major depressive disorder vs substance-induced mood disorder vs bipolar disorder.   According to the note, "Diagnosis of schizophrenia is also mentioned in the record but patient is unable to provide any  clear history of psychotic episodes unrelated to her  drug/alcohol use. She was first treated for mental problems at age 72 when she became depressed and OD on meds. She has been taking fluoxetine (up to 60 mg) for over 20 years per her report."   Risk to Self:  Denies Risk to Others:  Denies Prior Inpatient Therapy:   Prior Outpatient Therapy:    Past Medical History:  Past Medical History:  Diagnosis Date   Abscess of bursa of left elbow 09/23/2018   Acute hyperactive alcohol withdrawal delirium ([place]) 10/09/2017   Acute low back pain with bilateral sciatica    Acute metabolic encephalopathy 28/78/6767   Acute on chronic pancreatitis ([place]) 04/04/2021   AKI (acute kidney injury) ([place]) 04/04/2021   Alcohol withdrawal syndrome with complication ([place]) 20/94/7096   Alcohol-induced acute pancreatitis    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[place]) 12/05/2018   Ascites due to alcoholic cirrhosis ([place])    Bipolar affective disorder ([place])    With anxiety features   Bunionette of right foot 11/2019   Cirrhosis of liver ([place])    Due to alcohol and hepatitis C   Cocaine dependence without complication ([place]) 28/36/6294   Decompensated hepatic cirrhosis ([place]) 07/23/2017   Depression with anxiety    Epistaxis 01/20/2019   Erysipelas 09/13/2018   Esophageal 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 (HCC)    Esophageal varices without bleeding (HCC)    ETOHism (HCC)    GERD (gastroesophageal reflux disease)    Head injury    Hematemesis 02/10/2018   Hepatitis C 2018   hepatitis c and alcohol related hepatitis   Heroin abuse ([place])    History of blood transfusion    "blood doesn't clot; I fell down and had to have a transfusion"   History of kidney stones    Menopause 2016   Migraine    "when I get really stressed" (09/01/2017)   Neuropathy 10/27/2018   Olecranon bursitis of left elbow    Other mixed anxiety disorders 10/27/2018   Overdose 08/22/2018   Pancytopenia ([place]) 07/23/2017   Polysubstance abuse ([place]) 04/08/2018   Portal hypertension ([place]) 03/18/2018   S/P foot  surgery, right 11/2019   Schizophrenia ([place])    Seizures ([place])    "when I run out of my RX; lots recently" (09/01/2017)   Suicidal ideation    Thrombocytopenia ([place])    Trichimoniasis 10/30/2017   Upper GI bleed 02/10/2018   Urinary urgency 05/2020   UTI (urinary tract infection) 06/02/2017    Past Surgical History:  Procedure Laterality Date   AMPUTATION Right 07/02/2022   Procedure: RIGHT BELOW KNEE AMPUTATION;  Surgeon: Newt Minion, MD;  Location: [place];  Service: Orthopedics;  Laterality: Right;   APPLICATION OF WOUND VAC Right 07/02/2022   Procedure: APPLICATION OF WOUND VAC;  Surgeon: Newt Minion, MD;  Location: [place];  Service: Orthopedics;  Laterality: Right;   BUNIONECTOMY     ESOPHAGEAL BANDING  06/26/2021   Procedure: ESOPHAGEAL BANDING;  Surgeon: Jackquline Denmark, MD;  Location: [facility] ENDOSCOPY;  Service: Endoscopy;;   ESOPHAGOGASTRODUODENOSCOPY N/A 09/03/2017   Procedure: ESOPHAGOGASTRODUODENOSCOPY (EGD);  Surgeon: Doran Stabler, MD;  Location: [place];  Service: Gastroenterology;  Laterality: N/A;   ESOPHAGOGASTRODUODENOSCOPY N/A 02/01/2022   Procedure: ESOPHAGOGASTRODUODENOSCOPY (EGD);  Surgeon: Clarene Essex, MD;  Location: [facility];  Service: Gastroenterology;  Laterality: N/A;   ESOPHAGOGASTRODUODENOSCOPY (EGD) WITH PROPOFOL N/A 06/26/2021   Procedure: ESOPHAGOGASTRODUODENOSCOPY (EGD) WITH PROPOFOL;  Surgeon: Jackquline Denmark, MD;  Location[facility] END[facility]COPY;  Service: Endoscopy;  Laterality: N/A;   FINGER FRACTURE SURGERY Left    "shattered my pinky"   FRACTURE SURGERY     I & D EXTREMITY Left 09/18/2018   Procedure: IRRIGATION AND DEBRIDEMENT EXTREMITY;  Surgeon: Leanora Cover, MD;  Location: [facility];  Service: Orthopedics;  Laterality: Left;   IR PARACENTESIS  07/23/2017   IR PARACENTESIS  07/2017   "did it twice in the same week" (09/01/2017)   IR PARACENTESIS  06/26/2021   IR PARACENTESIS  07/10/2022   IR PARACENTESIS  09/04/2022   SHOULDER OPEN ROTATOR  CUFF REPAIR Right    TOOTH EXTRACTION  08/2019   TUBAL LIGATION     VAGINAL HYSTERECTOMY     Family History:  Family History  Problem Relation Age of Onset   Lung cancer Mother 40   Alcohol abuse Mother    Throat cancer Father 60   Family Psychiatric  History: unable to obtain Social History:  Social History   Substance and Sexual Activity  Alcohol Use Yes   Alcohol/week: 6.0 standard drinks of alcohol   Types: 6 Cans of beer per week   Comment: pt states she had 1 can beer on 02/26/22     Social History   Substance and Sexual Activity  Drug Use Not Currently    Social History   Socioeconomic History   Marital status: Divorced    Spouse name: Not on file   Number of children: 2   Years of education: Not on file   Highest education level: Not on file  Occupational History   Occupation: applying for disability  Tobacco Use   Smoking status: Never   Smokeless tobacco: Never  Vaping Use   Vaping Use: Never used  Substance and Sexual Activity   Alcohol use: Yes    Alcohol/week: 6.0 standard drinks of alcohol    Types: 6 Cans of beer per week    Comment: pt states she had 1 can beer on 02/26/22   Drug use: Not Currently   Sexual activity: Not on file  Other Topics Concern   Not on file  Social History Narrative   She moved with a boyfriend to [place] and was followed at [facility].  He died of a massive heart attack in 8/18, per her report, and so she moved back to [place] and is living with a friend.      Right handed   2 story home    Lives alone 11/09/19   Social Determinants of Health   Financial Resource Strain: Not on file  Food Insecurity: No Food Insecurity (05/14/2022)   Hunger Vital Sign    Worried About Running Out of Food in the Last Year: Never true    Ran Out of Food in the Last Year: Never true  Transportation Needs: No Transportation Needs (05/14/2022)   PRAPARE - Hydrologist (Medical): No    Lack of  Transportation (Non-Medical): No  Physical Activity: Not on file  Stress: Not on file  Social Connections: Not on file   Additional Social History:    Allergies:   Allergies  Allergen Reactions   Other Other (See Comments)    Platelets: Rx chest pain, tremors, body aches  Patient stated that she receives platelet injection every Wednesday     Labs:  Results for orders placed or performed during the hospital encounter of 09/02/22 (from the past 48 hour(s))  Basic metabolic panel     Status: Abnormal   Collection Time:  09/04/22  3:13 AM  Result Value Ref Range   Sodium 135 135 - 145 mmol/L   Potassium 3.7 3.5 - 5.1 mmol/L   Chloride 100 98 - 111 mmol/L   CO2 27 22 - 32 mmol/L   Glucose, Bld 88 70 - 99 mg/dL    Comment: Glucose reference range applies only to samples taken after fasting for at least 8 hours.   BUN <5 (L) 6 - 20 mg/dL   Creatinine, Ser 0.70 0.44 - 1.00 mg/dL   Calcium 8.0 (L) 8.9 - 10.3 mg/dL   GFR, Estimated >60 >60 mL/min    Comment: (NOTE) Calculated using the CKD-EPI Creatinine Equation (2021)    Anion gap 8 5 - 15    Comment: Performed at [facility] [address] [place], [place] [address]  CBC     Status: Abnormal   Collection Time: 09/04/22  3:13 AM  Result Value Ref Range   WBC 3.9 (L) 4.0 - 10.5 K/uL   RBC 4.29 3.87 - 5.11 MIL/uL   Hemoglobin 12.9 12.0 - 15.0 g/dL   HCT 37.6 36.0 - 46.0 %   MCV 87.6 80.0 - 100.0 fL   MCH 30.1 26.0 - 34.0 pg   MCHC 34.3 30.0 - 36.0 g/dL   RDW 17.5 (H) 11.5 - 15.5 %   Platelets 105 (L) 150 - 400 K/uL   nRBC 0.0 0.0 - 0.2 %    Comment: Performed at [facility], [place] [address] [place], [place] [address]  Magnesium     Status: Abnormal   Collection Time: 09/04/22  3:13 AM  Result Value Ref Range   Magnesium 1.6 (L) 1.7 - 2.4 mg/dL    Comment: Performed at [facility] [address], [place], [place] [address]  Folate     Status: None   Collection Time: 09/04/22  3:13 AM  Result  Value Ref Range   Folate 26.2 >5.9 ng/mL    Comment: Performed at [facility] [address], [place], [place] [address]  TSH     Status: None   Collection Time: 09/04/22  3:13 AM  Result Value Ref Range   TSH 2.538 0.350 - 4.500 uIU/mL    Comment: Performed by a 3rd Generation assay with a functional sensitivity of <=0.01 uIU/mL. Performed at [facility], [place] [address], [place], [place] [address]   Vitamin B12     Status: Abnormal   Collection Time: 09/04/22  3:13 AM  Result Value Ref Range   Vitamin B-12 2,150 (H) 180 - 914 pg/mL    Comment: (NOTE) This assay is not validated for testing neonatal or myeloproliferative syndrome specimens for Vitamin B12 levels. Performed at [facility], [place] [address], [place], [place] [address]   MRSA Next Gen by PCR, Nasal     Status: None   Collection Time: 09/04/22  7:36 AM   Specimen: Nasal Mucosa; Nasal Swab  Result Value Ref Range   MRSA by PCR Next Gen NOT DETECTED NOT DETECTED    Comment: (NOTE) The GeneXpert MRSA Assay (FDA approved for NASAL specimens only), is one component of a comprehensive MRSA colonization surveillance program. It is not intended to diagnose MRSA infection nor to guide or monitor treatment for MRSA infections. Test performance is not FDA approved in patients less than 64 years old. Performed at [facility], [place] [address] [place], [place] [address]   Culture, body fluid w Gram  Stain-bottle     Status: None (Preliminary result)   Collection Time: 09/04/22 12:26 PM   Specimen: Peritoneal Washings  Result Value Ref Range   Specimen Description PERITONEAL    Special Requests NONE    Culture      NO GROWTH < 24 HOURS Performed at [facility], [place] [address] [place], [place] [address]    Report Status PENDING   Gram stain     Status: None   Collection Time: 09/04/22 12:26 PM   Specimen: Peritoneal Washings  Result Value Ref Range   Specimen Description PERITONEAL     Special Requests NONE    Gram Stain      WBC PRESENT, PREDOMINANTLY MONONUCLEAR NO ORGANISMS SEEN CYTOSPIN SMEAR Performed at [facility], [place] [address] [place], [place] [address]    Report Status 09/04/2022 FINAL   Body fluid cell count with differential     Status: None   Collection Time: 09/04/22 12:26 PM  Result Value Ref Range   Fluid Type-FCT Peritoneal    Color, Fluid STRAW    Appearance, Fluid CLEAR CLEAR   Total Nucleated Cell Count, Fluid 2 0 - 1,000 cu mm   Other Cells, Fluid TOO FEW TO COUNT, SMEAR AVAILABLE FOR REVIEW %    Comment: RARE LYMPHS PRESENT Performed at [facility], [place] [address], [place], [place] [address]   Albumin, pleural or peritoneal fluid      Status: None   Collection Time: 09/04/22 12:26 PM  Result Value Ref Range   Albumin, Fluid <1.5 g/dL    Comment: (NOTE) No normal range established for this test Results should be evaluated in conjunction with serum values    Fluid Type-FALB PERITONEAL CAVITY     Comment: Performed at [facility] [address], [place], [place] [address]  CBC     Status: Abnormal   Collection Time: 09/05/22  1:12 AM  Result Value Ref Range   WBC 8.0 4.0 - 10.5 K/uL   RBC 4.19 3.87 - 5.11 MIL/uL   Hemoglobin 12.3 12.0 - 15.0 g/dL   HCT 39.1 36.0 - 46.0 %   MCV 93.3 80.0 - 100.0 fL   MCH 29.4 26.0 - 34.0 pg   MCHC 31.5 30.0 - 36.0 g/dL   RDW 17.6 (H) 11.5 - 15.5 %   Platelets 82 (L) 150 - 400 K/uL    Comment: SPECIMEN CHECKED FOR CLOTS Immature Platelet Fraction may be clinically indicated, consider ordering this additional test [id number] REPEATED TO VERIFY PLATELET COUNT CONFIRMED BY SMEAR    nRBC 0.0 0.0 - 0.2 %    Comment: Performed at [facility], [place] [address] [place], [place] [address]  Basic metabolic panel     Status: Abnormal   Collection Time: 09/05/22  1:12 AM  Result Value Ref Range   Sodium 133 (L) 135 - 145 mmol/L   Potassium 4.2 3.5 - 5.1 mmol/L   Chloride 100  98 - 111 mmol/L   CO2 22 22 - 32 mmol/L   Glucose, Bld 111 (H) 70 - 99 mg/dL    Comment: Glucose reference range applies only to samples taken after fasting for at least 8 hours.   BUN <5 (L) 6 - 20 mg/dL   Creatinine, Ser 1.09 (H) 0.44 - 1.00 mg/dL   Calcium 7.9 (L) 8.9 - 10.3 mg/dL   GFR, Estimated >60 >60 mL/min    Comment: (NOTE) Calculated using the CKD-EPI Creatinine Equation (2021)  Anion gap 11 5 - 15    Comment: Performed at [facility] [address] [place], [place] [address]  Albumin     Status: Abnormal   Collection Time: 09/05/22  1:12 AM  Result Value Ref Range   Albumin 1.9 (L) 3.5 - 5.0 g/dL    Comment: Performed at [facility] [address], [place], [place] [address]  Lactic acid, plasma     Status: None   Collection Time: 09/05/22  6:56 AM  Result Value Ref Range   Lactic Acid, Venous 1.5 0.5 - 1.9 mmol/L    Comment: Performed at [facility] [address] [place], [place] [address]  Protime-INR     Status: Abnormal   Collection Time: 09/05/22  6:56 AM  Result Value Ref Range   Prothrombin Time 18.1 (H) 11.4 - 15.2 seconds   INR 1.5 (H) 0.8 - 1.2    Comment: (NOTE) INR goal varies based on device and disease states. Performed at [facility], [place] [address] [place], [place] [address]   Vancomycin, trough     Status: Abnormal   Collection Time: 09/05/22  1:27 PM  Result Value Ref Range   Vancomycin Tr 26 (HH) 15 - 20 ug/mL    Comment: CRITICAL RESULT CALLED TO, READ BACK BY AND VERIFIED WITH E BROWN RN 09/05/2022 1439 B NUNNERY Performed at [facility], [place] [address] [place], [place] [address]    *Note: Due to a large number of results and/or encounters for the requested time period, some results have not been displayed. A complete set of results can be found in Results Review.    Current Facility-Administered Medications  Medication Dose Route Frequency Provider Last Rate Last Admin   acetaminophen  (TYLENOL) tablet 650 mg  650 mg Oral Q6H PRN Idamae Schuller, MD   650 mg at 09/05/22 1740   Or   acetaminophen (TYLENOL) suppository 650 mg  650 mg Rectal Q6H PRN Idamae Schuller, MD       ARIPiprazole (ABILIFY) tablet 15 mg  15 mg Oral Daily Idamae Schuller, MD   15 mg at 09/05/22 0934   cefTRIAXone (ROCEPHIN) 2 g in sodium chloride 0.9 % 100 mL IVPB  2 g Intravenous Q24H Iona Coach, MD 200 mL/hr at 09/05/22 0942 2 g at 09/05/22 0942   clonazePAM (KLONOPIN) tablet 0.5 mg  0.5 mg Oral BID Atway, Rayann N, DO   0.5 mg at 09/05/22 0934   DULoxetine (CYMBALTA) DR capsule 60 mg  60 mg Oral Olivia Canter, MD   60 mg at 09/04/22 2123   enoxaparin (LOVENOX) injection 40 mg  40 mg Subcutaneous Q24H Idamae Schuller, MD   40 mg at 81/44/81 8563   folic acid (FOLVITE) tablet 1 mg  1 mg Oral Daily Idamae Schuller, MD   1 mg at 09/05/22 0934   gabapentin (NEURONTIN) tablet 300 mg  300 mg Oral TID Atway, Rayann N, DO   300 mg at 09/05/22 0933   haloperidol lactate (HALDOL) injection 1 mg  1 mg Intravenous Q6H PRN Atway, Rayann N, DO       ibuprofen (ADVIL) tablet 400 mg  400 mg Oral Q6H PRN Iona Coach, MD   400 mg at 09/05/22 1232   lactated ringers infusion   Intravenous Continuous Johny Blamer, DO 125 mL/hr at 09/05/22 0943 New Bag at 09/05/22 0943   lactulose (CHRONULAC) 10 GM/15ML solution 20 g  20 g Oral TID  Johny Blamer, DO   20 g at 09/05/22 0934   midodrine (PROAMATINE) tablet 10 mg  10 mg Oral TID WC Idamae Schuller, MD   10 mg at 09/05/22 1232   multivitamin with minerals tablet 1 tablet  1 tablet Oral Daily Atway, Rayann N, DO   1 tablet at 09/05/22 1610   Oral care mouth rinse  15 mL Mouth Rinse PRN Charise Killian, MD       pantoprazole (PROTONIX) EC tablet 40 mg  40 mg Oral Daily Idamae Schuller, MD   40 mg at 09/05/22 9604   rifaximin (XIFAXAN) tablet 550 mg  550 mg Oral BID Johny Blamer, DO   550 mg at 09/05/22 5409   thiamine (VITAMIN B1) tablet 100 mg  100 mg Oral Daily Reome, Earle J, RPH   100 mg  at 09/05/22 0933   vancomycin (VANCOCIN) IVPB 1000 mg/200 mL premix  1,000 mg Intravenous Q12H Reome, Earle J, RPH 200 mL/hr at 09/05/22 1348 1,000 mg at 09/05/22 1348   Facility-Administered Medications Ordered in Other Encounters  Medication Dose Route Frequency Provider Last Rate Last Admin   acetaminophen (TYLENOL) 325 MG tablet            diphenhydrAMINE (BENADRYL) 25 mg capsule             Musculoskeletal: Strength & Muscle Tone: within normal limits Gait & Station:  n/a - lying in the bed Patient leans: N/A  Psychiatric Specialty Exam:  Wearing a gown, lying in in the bed, shivering. RASS -2. Uttering some words. Unable to obey commands/follow finger with her eyes . No rigidity on bilateral arms.   Unable to assess mood/thought process/perceptual disturbances/SI/HI due to current mental state.    Physical Exam: Physical Exam Vitals and nursing note reviewed.    Review of Systems  Unable to perform ROS: Mental status change   Blood pressure (!) 91/52, pulse 71, temperature 97.9 F (36.6 C), temperature source Oral, resp. rate 16, height 5' (1.524 m), weight 62.9 kg, SpO2 96 %. Body mass index is 27.08 kg/m.   Assessment  Briana Sweeney is a 51 y.o. female patient with history of alcohol use disorder, polysubstance abuse (cocaine, heroine), major depressive disorder vs substance-induced mood disorder vs bipolar disorder, cirrhosis, Hep C, seizures, CKD3, s/p right BKA, GERD, who was admitted for RLE/LLE cellulitis.  Psychiatry is consulted for acute psychosis.    # Delirium  The patient's exam is notable for altered sensorium, perceptual disturbances, disorientation and cognitive deficits that appear markedly different than their baseline, suggesting a diagnosis of delirium.  She is at risk of delirium due to her history of stroke, infection, cirrhosis, severe pain and possible substance use. Addressing the underlying medical condition and institution of preventative  measures are recommended.   Would recommend having the following prn meds in case of any future aggression/behavioral issues.  Noted that although there is a documented history of schizoaffective disorder/schizophrenia, it is unclear whether her psychotic symptoms occurred in the context of substance use.  Will recommend to stay on Abilify at this time given this is her home medication.  If she were to have any issues at night, consider switching to olanzapine to target both behavior issues and insomnia.  Would recommend restarting gabapentin at a lower dose to avoid withdrawal unless it is medically contraindicated.   # Alcohol use disorder  It is not clear regarding the last time of her alcohol use.   Recommendation - c haloperidol 1 mg IV  q6hprn for agitation (EKG: QTc Hodges: 432 msec. HR 101) - c lorazepam 1 mg IV q6hrpn for agitation, if refractory to haloperidol use - Continue Abilify 15 mg at night  - Continue clonazepam 0.5 mg twice a day (home meds)  - Restart gabapentin 300 mg three times a day (home meds- was on 600 mg TID) - c CIWA protocol - c IV thiamine  - Consider sitter if any worsening in her behavioral issues (currently somnolent) - Continue to monitor and treat underlying medical causes of delirium, including infection, electrolyte disturbances, etc. - Delirium precautions - Minimize/avoid deliriogenic meds including: anticholinergic, opiates, benzodiazepines           - Maintain hydration, oxygenation, nutrition           - Limit use of restraints and catheters           - Normalize sleep patterns by minimizing nighttime noise, light and interruptions by                clustering care, opening blinds during the day           - Reorient the patient frequently, provide easily visible clock and calendar           - Provide sensory aids like glasses, hearing aids           - Encourage ambulation, regular activities and visitors to maintain cognitive stimulation    Will  psychiatrically clear at this time.   Suella Broad, FNP 09/05/2022 3:02 PM

## 2022-09-05 NOTE — Evaluation (Signed)
Occupational Therapy Evaluation Patient Details Name: Briana Sweeney MRN: [id number] DOB: 1971-01-27 Today's Date: 09/05/2022   History of Present Illness Pt admitted for LLE cellulitis and confusion.  PMH includes ETOH abuse, polysubstance abuse, cirrhosis, GRD, seizures, anxiety, depression, hepatitis C, CKD. Recent R BKA 2 months ago. She reports that she has had L leg pain swelling and redness that has been worsening over the past week. She states that the area of her R residual limb pain did previously resolve but has come back. Pain was severe on presentation to ED on 12/25. Pt uses W/C for mobility at baseline   Clinical Impression   Pt currently min assist level for simulated selfcare tasks sit to stand and for simulated toilet transfer step pivot.  Some confusion and decreased awareness noted as pt does not acknowledge balance deficits and risk of being home without assist.  Feel she will benefit from acute care OT at this time.  Recommend follow-up SNF level rehab unless 24 hr supervision can be arranged.  Will continue to follow.        Recommendations for follow up therapy are one component of a multi-disciplinary discharge planning process, led by the attending physician.  Recommendations may be updated based on patient status, additional functional criteria and insurance authorization.   Follow Up Recommendations  Skilled nursing-short term rehab (<3 hours/day)     Assistance Recommended at Discharge Frequent or constant Supervision/Assistance  Patient can return home with the following A little help with walking and/or transfers;A little help with bathing/dressing/bathroom;Assistance with cooking/housework;Assist for transportation;Help with stairs or ramp for entrance;Direct supervision/assist for medications management    Functional Status Assessment  Patient has had a recent decline in their functional status and demonstrates the ability to make significant improvements  in function in a reasonable and predictable amount of time.  Equipment Recommendations  None recommended by OT       Precautions / Restrictions Precautions Precautions: Fall Precaution Comments: hx of R BKA Restrictions Weight Bearing Restrictions: No      Mobility Bed Mobility Overal bed mobility: Needs Assistance Bed Mobility: Supine to Sit     Supine to sit: Min assist, HOB elevated          Transfers Overall transfer level: Needs assistance Equipment used: Rolling walker (2 wheels) Transfers: Sit to/from Stand, Bed to chair/wheelchair/BSC Sit to Stand: Min assist     Step pivot transfers: Min assist            Balance Overall balance assessment: Needs assistance Sitting-balance support: No upper extremity supported, Single extremity supported, Feet unsupported, Feet supported Sitting balance-Leahy Scale: Fair     Standing balance support: Bilateral upper extremity supported, During functional activity, Reliant on assistive device for balance Standing balance-Leahy Scale: Poor                             ADL either performed or assessed with clinical judgement   ADL Overall ADL's : Needs assistance/impaired Eating/Feeding: Independent;Sitting   Grooming: Wash/dry hands;Wash/dry face;Supervision/safety;Sitting   Upper Body Bathing: Supervision/ safety;Sitting Upper Body Bathing Details (indicate cue type and reason): simulated Lower Body Bathing: Minimal assistance;Sit to/from stand Lower Body Bathing Details (indicate cue type and reason): simulated Upper Body Dressing : Set up;Sitting Upper Body Dressing Details (indicate cue type and reason): simulated Lower Body Dressing: Minimal assistance;Sit to/from stand   Toilet Transfer: Minimal assistance;Stand-pivot;Rolling walker (2 wheels) Toilet Transfer Details (indicate cue type and reason):  simulated to the bedside recliner Toileting- Clothing Manipulation and Hygiene: Minimal  assistance;Sit to/from stand       Functional mobility during ADLs: Minimal assistance;Rolling walker (2 wheels) General ADL Comments: Pt reports having friends/neighbors that assist at time with taking her to the grocery store.  Does not have 24 hr supervison.  She has grab bars in the shower and at the toilet and would use them for transfers instead of the RW.     Vision Baseline Vision/History:  (reports that she is supposed to wear glasses but does not have them) Ability to See in Adequate Light: 0 Adequate Patient Visual Report: No change from baseline Vision Assessment?: No apparent visual deficits            Pertinent Vitals/Pain Pain Assessment Pain Assessment: Faces Faces Pain Scale: Hurts a little bit Pain Location: right residual limb Pain Descriptors / Indicators: Discomfort Pain Intervention(s): Repositioned, Monitored during session     Hand Dominance Right   Extremity/Trunk Assessment Upper Extremity Assessment Upper Extremity Assessment: Overall WFL for tasks assessed   Lower Extremity Assessment Lower Extremity Assessment: Defer to PT evaluation RLE Deficits / Details: R BKA   Cervical / Trunk Assessment Cervical / Trunk Assessment: Normal   Communication Communication Communication: No difficulties   Cognition Arousal/Alertness: Awake/alert Behavior During Therapy: Flat affect Overall Cognitive Status: No family/caregiver present to determine baseline cognitive functioning                                 General Comments: Pt when asked if she had steps at home stated, she had been having a therapist come out.  Could have been a misunderstanding of the question.  However, when asking about the tub/shower at home and available DME there was some confusion with attempts to understand what therapist was asking.  Will continue to monitor.  Pt oriented to place, time, and situation.  Slightly impulsive as well wanting to stand before therapist  had walker ready to attempt.  Easily re-directed.  Decreased anticipatory awareness noted.     General Comments  Pt has cellulitis in the L LE with desquamation, slight discoloration. open area that has scabbed over on the R residual limb.            Home Living Family/patient expects to be discharged to:: Private residence Living Arrangements: Alone Available Help at Discharge: Neighbor;Available PRN/intermittently Type of Home: Apartment Home Access: Elevator     Home Layout: One level     Bathroom Shower/Tub: Tub/shower unit;Curtain   Bathroom Toilet: Standard Bathroom Accessibility: Yes   Home Equipment: Grab bars - tub/shower;Grab bars - toilet;Wheelchair - manual;Tub bench          Prior Functioning/Environment Prior Level of Function : Independent/Modified Independent             Mobility Comments: pt reports 2 falls since last hospitalization          OT Problem List: Decreased strength;Decreased activity tolerance;Impaired balance (sitting and/or standing);Decreased safety awareness;Pain;Decreased cognition;Decreased knowledge of use of DME or AE      OT Treatment/Interventions: Self-care/ADL training;Patient/family education;Balance training;Neuromuscular education;Therapeutic activities;DME and/or AE instruction;Cognitive remediation/compensation    OT Goals(Current goals can be found in the care plan section) Acute Rehab OT Goals Patient Stated Goal: Pt did not state during session, but agreeable to transfer OOB. OT Goal Formulation: With patient Time For Goal Achievement: 09/19/22 Potential to Achieve Goals: Good  OT  Frequency: Min 2X/week    Co-evaluation PT/OT/SLP Co-Evaluation/Treatment: Yes Reason for Co-Treatment: To address functional/ADL transfers PT goals addressed during session: Mobility/safety with mobility;Balance;Proper use of DME OT goals addressed during session: ADL's and self-care;Proper use of Adaptive equipment and DME       AM-PAC OT "6 Clicks" Daily Activity     Outcome Measure Help from another person eating meals?: None Help from another person taking care of personal grooming?: A Little Help from another person toileting, which includes using toliet, bedpan, or urinal?: A Little Help from another person bathing (including washing, rinsing, drying)?: A Little Help from another person to put on and taking off regular upper body clothing?: None Help from another person to put on and taking off regular lower body clothing?: A Little 6 Click Score: 20   End of Session Equipment Utilized During Treatment: Gait belt Nurse Communication: Mobility status  Activity Tolerance: Patient tolerated treatment well Patient left: in chair;with chair alarm set  OT Visit Diagnosis: Unsteadiness on feet (R26.81);Muscle weakness (generalized) (M62.81);Other symptoms and signs involving cognitive function                Time: 7124-5809 OT Time Calculation (min): 30 min Charges:  OT General Charges $OT Visit: 1 Visit OT Evaluation $OT Eval Moderate Complexity: 1 Mod  Madysyn Hanken OTR/L 09/05/2022, 12:51 PM

## 2022-09-05 NOTE — Progress Notes (Addendum)
Subjective: Paged by nursing staff for MAP in the 12Y-48G with systolics <50I. Our team went to reassess the patient.  Patient was somnolent but arousable to verbal stimuli.  She denies chest pain or shortness of breath at this time.  She reports "fluttering sensation" in her heart.   Objective: Constitutional: Chronically ill-appearing, somnolent but arousable to verbal stimuli, falls asleep mid conversation Eyes: EOMI. PERRL.  Cardiovascular: Regular rate, regular rhythm. No murmurs, rubs, or gallops. No LE edema.  Pulmonary: Normal respiratory effort. No wheezes, rales, or rhonchi.  Neurological: decreased alertness, oriented x4, responding to questioning appropriately Skin: warm and dry.    Plan: MAP consistently in the 37C-48G with systolic <89V on both arms.  Heart rate in the 70s.  Patient continues to be somnolent.  Suspect her hypotension is multifactorial.  Suspect she is not eating much given her somnolence. She is also receiving spironolactone and Lasix for ascites in the setting of decompensated cirrhosis. She underwent paracentesis today with 1.3 L of fluid removed.  Will hold off on IV fluids at this time given her cirrhosis and concern for ascites.  Will administer IV albumin and reassess her.  May consider midodrine if blood pressure does not improve with albumin.  Will speak with PCCM if unable to improve her blood pressure.  Will obtain EKG given her description of fluttering sensation in her chest. -IV albumin 25g -Pending EKG  0100: Patient reassessed.  First half of IV albumin administered.  MAP remains in the 69I with systolic <50T.  Mentation is unchanged.  EKG demonstrates normal sinus rhythm.  Second half of IV albumin started.  Will reassess patient's blood pressure following completion of albumin.  Suspect that diuresis is contributing to her hypotension.  Will decrease Lasix and spironolactone dose to 20 mg and 50 mg qd respectively. Appears multifactorial given sedation  meds and cirrhosis history.  -Decrease Lasix to 20 mg daily and Spironolactone to 50 mg daily. -Pt may benefit from decrease in her centrally acting medications (none are due until tmrw).  -f/u pending labs (albumin, lactic acid, PT/INR) -Change level of care to med-tele -Start midodrine 10 mg TID

## 2022-09-05 NOTE — Progress Notes (Signed)
Pharmacy Antibiotic Note  Briana Sweeney is a 51 y.o. female admitted on 09/02/2022 with BLE cellulitis. 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 Pharmacy has been consulted for vancomycin dosing.  Vanc peak = 56, vanc trough = 27 giving a calculated AUC of 1012 (supratherapeutic)  Plan: Adjust vancomycin to '1000mg'$  IV q24 hours (eAUC 505) Monitor vancomycin levels as needed F/u ortho plans  Height: 5' (152.4 cm) Weight: 62.9 kg (138 lb 10.7 oz) IBW/kg (Calculated) : 45.5  Temp (24hrs), Avg:98.1 F (36.7 C), Min:97.7 F (36.5 C), Max:98.6 F (37 C)  Recent Labs  Lab 09/02/22 1430 09/02/22 1432 09/03/22 0328 09/04/22 0313 09/05/22 0112 09/05/22 0656 09/05/22 1327 09/05/22 1606  WBC 4.2  --   --  3.9* 8.0  --   --   --   CREATININE 0.59  --  0.71 0.70 1.09*  --   --   --   LATICACIDVEN  --  1.5  --   --   --  1.5  --   --   VANCOTROUGH  --   --   --   --   --   --  26*  --   VANCOPEAK  --   --   --   --   --   --   --  56*    Estimated Creatinine Clearance: 51.2 mL/min (A) (by C-G formula based on SCr of 1.09 mg/dL (H)).    Allergies  Allergen Reactions   Other Other (See Comments)    Platelets: Rx chest pain, tremors, body aches  Patient stated that she receives platelet injection every Wednesday     Antimicrobials this admission: Vancomycin 12/25 >>  Ceftriaxone 12/26 >>  Cefepime x1 12/25  Dose adjustments this admission:  Microbiology results: 12/25 BCx: ngtd 12/27 Peritoneal fluid: ngtd  Thank you for allowing pharmacy to be a part of this patient's care.  Dimple Nanas, PharmD, BCPS 09/05/2022 6:26 PM

## 2022-09-05 NOTE — Progress Notes (Signed)
HD#3 Subjective:   Summary: This is a 51 year old female with a past medical history of alcohol use disorder, polysubstance use disorder, cirrhosis, hepatitis C, GERD, and seizures who presented to the emergency room with concerns of bilateral lower extremity pain and erythema.  MRI showed evidence of right lower extremity osteomyelitis and myositis, and patient admitted for further evaluation and management.  Overnight Events: Night team paged overnight for concerns of decreased blood pressures.  Overnight team obtained EKG which did not show any acute changes.  Patient was given albumin, started on midodrine, and Lasix and spironolactone were cut in half.  This morning, patient states she is doing well, but she still has significant pain to her bilateral lower extremities.  She states that she has not had a bowel movement below days, with only bowel movement during hospitalization was on admission.  She states having a good appetite and is drinking well.  Patient does endorse having some jerks of her lower extremities.  Objective:  Vital signs in last 24 hours: Vitals:   09/05/22 0208 09/05/22 0331 09/05/22 0420 09/05/22 0613  BP: (!) 89/55 (!) 86/52 (!) 88/57 (!) 85/53  Pulse: 86 79 79 77  Resp:   18   Temp:   98.2 F (36.8 C)   TempSrc:   Oral   SpO2:   100%   Weight:      Height:       Supplemental O2: Room Air SpO2: 100 %   Physical Exam:  Constitutional: Resting in bed in no acute distress HENT: Scleral icterus Eyes: conjunctiva non-erythematous Neck: supple Cardiovascular: regular rate and rhythm, no m/r/g Pulmonary/Chest: normal work of breathing on room air, lungs clear to auscultation bilaterally Abdominal: Slightly distended, nontender Extremities: Right lower extremity with some erythema noted, and warmth noted. Wounds, or drainage noted.  No streaking noted.  Left lower extremity with some erythema and warmth noted as well.  No open wounds or drainage noted.   There is 1+ pitting edema noted to left lower extremity with tenderness noted to left anterior shin. Neurological: Alert and oriented x 3, no Asterixis noted on exam   Filed Weights   09/02/22 1327 09/03/22 0251  Weight: 60.8 kg 62.9 kg     Intake/Output Summary (Last 24 hours) at 09/05/2022 0617 Last data filed at 09/04/2022 2313 Gross per 24 hour  Intake 759.78 ml  Output --  Net 759.78 ml   Net IO Since Admission: 1,954.88 mL [09/05/22 0617]  Pertinent Labs:    Latest Ref Rng & Units 09/05/2022    1:12 AM 09/04/2022    3:13 AM 09/02/2022    2:30 PM  CBC  WBC 4.0 - 10.5 K/uL 8.0  3.9  4.2   Hemoglobin 12.0 - 15.0 g/dL 12.3  12.9  14.5   Hematocrit 36.0 - 46.0 % 39.1  37.6  43.1   Platelets 150 - 400 K/uL 82  105  138        Latest Ref Rng & Units 09/05/2022    1:12 AM 09/04/2022    3:13 AM 09/03/2022    3:28 AM  CMP  Glucose 70 - 99 mg/dL 111  88  72   BUN 6 - 20 mg/dL <5  <5  <5   Creatinine 0.44 - 1.00 mg/dL 1.09  0.70  0.71   Sodium 135 - 145 mmol/L 133  135  135   Potassium 3.5 - 5.1 mmol/L 4.2  3.7  4.1   Chloride 98 - 111  mmol/L 100  100  97   CO2 22 - 32 mmol/L '22  27  24   '$ Calcium 8.9 - 10.3 mg/dL 7.9  8.0  8.1   Total Protein 6.5 - 8.1 g/dL   5.9   Total Bilirubin 0.3 - 1.2 mg/dL   4.2   Alkaline Phos 38 - 126 U/L   139   AST 15 - 41 U/L   50   ALT 0 - 44 U/L   20     Imaging: MR TIBIA FIBULA RIGHT WO CONTRAST  Result Date: 09/04/2022 CLINICAL DATA:  Soft tissue infection suspected. EXAM: MRI OF LOWER RIGHT EXTREMITY WITHOUT CONTRAST TECHNIQUE: Multiplanar, multisequence MR imaging of the right lower extremity was performed. No intravenous contrast was administered. COMPARISON:  Radiographs dated September 02, 2022 and MR knee examination performed earlier on the same date. FINDINGS: Bones/Joint/Cartilage There is marrow edema about the stump site concerning for osteomyelitis. Degenerative changes of the knee joint. Small knee joint effusion.  Ligaments Intact Muscles and Tendons There is marked edema about the posterior calf muscles suggesting myositis. No intramuscular fluid collection or abscess. Soft tissues Skin wound about the anterior aspect of the stump site with marked surrounding subcutaneous edema prominent about the anterior aspect of the stump. Skin thickening. No appreciable fluid collection. IMPRESSION: 1. Skin wound about the anterior aspect of the stump site with marked surrounding subcutaneous edema and skin thickening concerning for cellulitis. No appreciable fluid collection or abscess. 2. Marrow edema about the stump site concerning for osteomyelitis. 3. Marked edema about the posterior calf muscles suggesting myositis. No intramuscular fluid collection or abscess. Electronically Signed   By: Keane Police D.O.   On: 09/04/2022 23:00   MR KNEE RIGHT WO CONTRAST  Result Date: 09/04/2022 CLINICAL DATA:  Leg swelling.  Osteomyelitis suspected. EXAM: MRI OF THE RIGHT KNEE WITHOUT CONTRAST TECHNIQUE: Multiplanar, multisequence MR imaging of the knee was performed. No intravenous contrast was administered. COMPARISON:  Right knee radiographs 09/02/2022, right tibia and fibula radiographs 08/08/2022 FINDINGS: This patient is status post amputation of the right lower extremity to the level of the proximal to mid tibial and fibular diaphysis. Please note that the current knee MRI does not include the distal amputation stump. Despite efforts by the technologist and patient, motion artifact is present on today's exam and could not be eliminated. This reduces exam sensitivity and specificity. MENISCI Medial meniscus: Within the limitations of moderate to high-grade patient motion artifact, there is truncation of the central third of the meniscal triangle of the mid transverse dimension of the posterior horn of the medial meniscus (sagittal series 5, image 23), likely chronic/degenerative radial tear. Lateral meniscus: Within limitations of  motion artifact, no definite lateral meniscal tear is seen. LIGAMENTS Cruciates: The ACL and PCL are intact. Collaterals: The medial collateral ligament is intact. The fibular collateral ligament, biceps femoris tendon, iliotibial band, and popliteus tendon are intact. CARTILAGE Patellofemoral: No gross cartilage defect, though motion artifact limits evaluation. Medial: Focal full-thickness cartilage loss within the mid weight-bearing medial femoral condyle with minimal subchondral marrow edema (coronal series 7, image 10). Lateral:  No gross cartilage defect. Joint: Nojoint effusion. Normal Hoffa's fat pad. No plical thickening. The tibial tuberosity-trochlear groove distance measures 7 mm, within normal limits. Popliteal Fossa:  No Baker's cyst. Extensor Mechanism:  Intact quadriceps tendon and patellar tendon. Bones: No acute fracture or dislocation. Within the limitations of patient motion artifact, no cortical erosion is seen to indicate acute osteomyelitis. Other: Mild edema  and swelling of the anterolateral knee subcutaneous fat. There is edema seen within the partially visualized proximal posterior tibial muscle and soleus muscle (axial series 2, images 14-30 of 30). IMPRESSION: 1. Status post amputation of the right lower extremity to the level of the proximal to mid tibial and fibular diaphysis. Please note that the current knee MRI does not include the distal amputation stump. 2. No definite acute osteomyelitis is seen within the visualized region of the knee, excluding the patient's stump. 3. Focal full-thickness cartilage loss within the mid weight-bearing medial femoral condyle with minimal subchondral marrow edema. 4. Likely chronic/degenerative radial tear of the central third of the meniscal triangle of the mid transverse dimension of the posterior horn of the medial meniscus. 5. Edema within the posterior tibial and soleus musculature extending inferiorly. No walled-off abscess is visualized.  Electronically Signed   By: Yvonne Kendall M.D.   On: 09/04/2022 17:27   IR Paracentesis  Result Date: 09/04/2022 INDICATION: Patient with history of cirrhosis and recurrent ascites. Request for diagnostic and therapeutic paracentesis. EXAM: ULTRASOUND GUIDED DIAGNOSTIC AND THERAPEUTIC LEFT LOWER QUADRANT PARACENTESIS MEDICATIONS: 10 mL 1 % lidocaine COMPLICATIONS: None immediate. PROCEDURE: Informed written consent was obtained from the patient after a discussion of the risks, benefits and alternatives to treatment. A timeout was performed prior to the initiation of the procedure. Initial ultrasound scanning demonstrates a large amount of ascites within the left lower abdominal quadrant. The left lower abdomen was prepped and draped in the usual sterile fashion. 1% lidocaine was used for local anesthesia. Following this, a 19 gauge, 7-cm, Yueh catheter was introduced. After 1.3 L were removed, patient had slight drop in BP and procedure was stopped. An ultrasound image was saved for documentation purposes. The paracentesis was performed. The catheter was removed and a dressing was applied. The patient tolerated the procedure well without immediate post procedural complication. FINDINGS: A total of approximately 1.3 L of pale, yellow fluid was removed. Samples were sent to the laboratory as requested by the clinical team. IMPRESSION: Successful ultrasound-guided paracentesis yielding 1.3 liters of peritoneal fluid. Read by: Narda Rutherford, AGNP-BC Electronically Signed   By: Miachel Roux M.D.   On: 09/04/2022 13:48    Assessment/Plan:   Principal Problem:   Cellulitis of right lower extremity Active Problems:   Delirium due to another medical condition   Patient Summary: Briana Sweeney is a 51 y.o. female with a past medical history of alcohol use disorder, polysubstance use disorder, cirrhosis, hepatitis C, GERD, and seizures who presented to the emergency room with concerns of bilateral lower  extremity pain and erythema.  MRI showed evidence of right lower extremity osteomyelitis and myositis, and patient admitted for further evaluation and management.  #Right lower extremity osteomyelitis #Right posterior calf myositis #Left lower extremity cellulitis MRI findings suggestive of osteomyelitis of right stump site and myositis of right posterior calf muscles.  Today is day 4 of IV vancomycin and day 3 of ceftriaxone.  Patient does have significant pain to bilateral lower extremities.  On exam, there is warmth and erythema noted to bilateral lower extremities.  Blood cultures with no growth thus far.  Will get Ortho involved given new diagnosis of osteomyelitis. -Ortho following -Day 4 vancomycin -Day 3 ceftriaxone -Monitor for worsening white count -Monitor fever curve -Patient n.p.o. for potential surgical intervention  #Decompensated liver cirrhosis #Hepatic encephalopathy #Alcohol use disorder Patient presents with decompensated liver cirrhosis.  Patient had paracentesis yesterday with removal of 1.3 L of fluid.  Unable to calculate SAG given albumin of fluid less than 1.5.  MELD score 17.  Child Pugh class C.  Patient is currently on nadolol, spironolactone, Lasix, and lactulose.  On exam, patient does not have Asterixis but there is slightly distended abdomen and scleral icterus noted.  Given hypotension, will need to decrease spironolactone and Lasix.  Patient has not had a bowel movement since the first day of admission.  Patient will likely need to increase her bowel regimen with lactulose. -Hold spironolactone and Lasix today -Continue nadolol -Increase lactulose -Add rifaximin today -Consider paracentesis if needed -Continue ceftriaxone day 3 of 7 -Follow cultures -Continue thiamine supplementation  #Hypotension Overnight, patient required to be evaluated for maps dropping below 65 and systolics below 90.  This likely is multifactorial in the setting of centrally acting  medications, antihypertensives, poor p.o. intake, and active infection.  Patient required albumin as well as midodrine overnight.  Patient also required fluids. -Start maintenance fluids -Lasix and spironolactone -Encourage increased p.o. intake -Continue to monitor blood pressure closely -Continue midodrine 10 mg 3 times daily  #AKI Likely prerenal in the setting of hypotension.  Creatinine bumped from 0.70-1.09 overnight. -Start maintenance fluids  #Hyponatremia Slightly decreased to 133 from 135 yesterday.  No acute concerns.  Continue to monitor. -BMP in a.m.  #Acute psychosis likely secondary to substance use #Anxiety/depression No concerns currently. -Continue Abilify 15 mg daily -Continue Klonopin 0.5 mg twice daily -Continue Cymbalta 60 mg daily -Continue gabapentin 300 mg 3 times daily -Psychiatry following -Haldol and Ativan as needed for agitation  #GERD Chronic -Continue pantoprazole 40 mg daily  Diet: NPO IVF: LR,125cc/hr VTE: Enoxaparin Code: Full PT/OT recs: SNF for Subacute PT   Dispo: Anticipated discharge to Skilled nursing facility in 3 days pending clinical improvement.   [place] [facility] Resident PGY-1 [phone] Please contact the on call pager after 5 pm and on weekends at [phone].

## 2022-09-05 NOTE — Progress Notes (Signed)
DBIV consult: Pt does not have a central line, discussed with Barnetta Chapel, RN: she will contact phlebotomy to re-attempt labs.

## 2022-09-05 NOTE — Progress Notes (Signed)
Lab tech unable to collect all labs needed (including Lactic Acid and Albumin). Providers aware. Lab tech was barely able to collect enough blood for CBC and BMP.  Message relayed that IV team can not do labs on this patient since she doesn't have a central line. Lab will have next tech to try and draw the rest of the labs needed.

## 2022-09-06 DIAGNOSIS — K7290 Hepatic failure, unspecified without coma: Secondary | ICD-10-CM | POA: Diagnosis not present

## 2022-09-06 DIAGNOSIS — K7460 Unspecified cirrhosis of liver: Secondary | ICD-10-CM

## 2022-09-06 DIAGNOSIS — M86261 Subacute osteomyelitis, right tibia and fibula: Secondary | ICD-10-CM

## 2022-09-06 DIAGNOSIS — L03115 Cellulitis of right lower limb: Secondary | ICD-10-CM | POA: Diagnosis not present

## 2022-09-06 DIAGNOSIS — M869 Osteomyelitis, unspecified: Secondary | ICD-10-CM | POA: Diagnosis not present

## 2022-09-06 DIAGNOSIS — L03116 Cellulitis of left lower limb: Secondary | ICD-10-CM | POA: Diagnosis not present

## 2022-09-06 LAB — CBC
HCT: 37.9 % (ref 36.0–46.0)
Hemoglobin: 12.8 g/dL (ref 12.0–15.0)
MCH: 30 pg (ref 26.0–34.0)
MCHC: 33.8 g/dL (ref 30.0–36.0)
MCV: 88.8 fL (ref 80.0–100.0)
Platelets: 50 10*3/uL — ABNORMAL LOW (ref 150–400)
RBC: 4.27 MIL/uL (ref 3.87–5.11)
RDW: 17.3 % — ABNORMAL HIGH (ref 11.5–15.5)
WBC: 8.2 10*3/uL (ref 4.0–10.5)
nRBC: 0 % (ref 0.0–0.2)

## 2022-09-06 LAB — PROTIME-INR
INR: 1.5 — ABNORMAL HIGH (ref 0.8–1.2)
Prothrombin Time: 18 seconds — ABNORMAL HIGH (ref 11.4–15.2)

## 2022-09-06 LAB — BASIC METABOLIC PANEL
Anion gap: 12 (ref 5–15)
BUN: 5 mg/dL — ABNORMAL LOW (ref 6–20)
CO2: 26 mmol/L (ref 22–32)
Calcium: 8.2 mg/dL — ABNORMAL LOW (ref 8.9–10.3)
Chloride: 97 mmol/L — ABNORMAL LOW (ref 98–111)
Creatinine, Ser: 1.94 mg/dL — ABNORMAL HIGH (ref 0.44–1.00)
GFR, Estimated: 31 mL/min — ABNORMAL LOW (ref >60)
Glucose, Bld: 93 mg/dL (ref 70–99)
Potassium: 3.8 mmol/L (ref 3.5–5.1)
Sodium: 135 mmol/L (ref 135–145)

## 2022-09-06 LAB — LIPASE, FLUID: Lipase-Fluid: 41 U/L

## 2022-09-06 MED ORDER — ALBUMIN HUMAN 25 % IV SOLN
62.9000 g | Freq: Once | INTRAVENOUS | Status: AC
  Administered 2022-09-06: 62.5 g via INTRAVENOUS
  Filled 2022-09-06 (×3): qty 250

## 2022-09-06 MED ORDER — VANCOMYCIN HCL IN DEXTROSE 1-5 GM/200ML-% IV SOLN
1000.0000 mg | INTRAVENOUS | Status: DC
  Filled 2022-09-06: qty 200

## 2022-09-06 MED ORDER — DOXYCYCLINE HYCLATE 100 MG PO TABS
100.0000 mg | ORAL_TABLET | Freq: Two times a day (BID) | ORAL | Status: DC
  Administered 2022-09-07 – 2022-09-10 (×7): 100 mg via ORAL
  Filled 2022-09-06 (×7): qty 1

## 2022-09-06 MED ORDER — MIDODRINE HCL 5 MG PO TABS
15.0000 mg | ORAL_TABLET | Freq: Three times a day (TID) | ORAL | Status: DC
  Administered 2022-09-06 – 2022-09-10 (×13): 15 mg via ORAL
  Filled 2022-09-06 (×13): qty 3

## 2022-09-06 NOTE — Progress Notes (Signed)
HD#4 Subjective:   Summary: This is a 51 year old female with a past medical history of alcohol use disorder, polysubstance use disorder, cirrhosis, hepatitis C, GERD, and seizures who presented to the emergency room with concerns of bilateral lower extremity pain and erythema.  MRI showed evidence of right lower extremity osteomyelitis and myositis, and patient admitted for further evaluation and management.  Overnight Events: No overnight events   Patient evaluated at bedside this AM.  Patient reports that she is still having pain in her right lower extremity.  She describes it as a cramping pain.  She does report having lightheadedness when she stands up.  She denies any dizziness, feels unbalanced.  She denies any tremors, nausea, or vomiting.  She reports a good appetite.  Objective:  Vital signs in last 24 hours: Vitals:   09/06/22 0059 09/06/22 0332 09/06/22 0656 09/06/22 0746  BP: (!) 86/52 (!) 91/56 (!) 90/53 (!) 90/55  Pulse: 74 71 70 83  Resp: '18 18  16  '$ Temp: (!) 97.3 F (36.3 C) 97.7 F (36.5 C)  98.2 F (36.8 C)  TempSrc: Axillary Oral  Oral  SpO2: 96% 98%  92%  Weight:      Height:       Supplemental O2: Room Air SpO2: 92 %   Physical Exam:  Constitutional: Resting in bed in no acute distress HENT: Scleral icterus Eyes: conjunctiva non-erythematous Cardiovascular: regular rate and rhythm, no m/r/g Pulmonary/Chest: Clear to auscultation bilaterally Abdominal: Slightly distended, with normoactive bowel sounds, no positive fluid wave Extremities: Bilateral lower extremities with warmth noted.  Open wound noted to right lower extremity with no evidence of drainage or bleeding.  Neurological: Mild Asterixis noted on exam   Filed Weights   09/02/22 1327 09/03/22 0251  Weight: 60.8 kg 62.9 kg     Intake/Output Summary (Last 24 hours) at 09/06/2022 1022 Last data filed at 09/06/2022 0525 Gross per 24 hour  Intake 1292.24 ml  Output --  Net 1292.24 ml    Net IO Since Admission: 3,487.12 mL [09/06/22 1022]  Pertinent Labs:    Latest Ref Rng & Units 09/06/2022    6:43 AM 09/05/2022    1:12 AM 09/04/2022    3:13 AM  CBC  WBC 4.0 - 10.5 K/uL 8.2  8.0  3.9   Hemoglobin 12.0 - 15.0 g/dL 12.8  12.3  12.9   Hematocrit 36.0 - 46.0 % 37.9  39.1  37.6   Platelets 150 - 400 K/uL 50  82  105        Latest Ref Rng & Units 09/06/2022    6:43 AM 09/05/2022    1:12 AM 09/04/2022    3:13 AM  CMP  Glucose 70 - 99 mg/dL 93  111  88   BUN 6 - 20 mg/dL 5  <5  <5   Creatinine 0.44 - 1.00 mg/dL 1.94  1.09  0.70   Sodium 135 - 145 mmol/L 135  133  135   Potassium 3.5 - 5.1 mmol/L 3.8  4.2  3.7   Chloride 98 - 111 mmol/L 97  100  100   CO2 22 - 32 mmol/L '26  22  27   '$ Calcium 8.9 - 10.3 mg/dL 8.2  7.9  8.0     Imaging: No results found.  Assessment/Plan:   Principal Problem:   Cellulitis of right lower extremity Active Problems:   Delirium due to another medical condition   Osteomyelitis of right tibia [facility])   Patient Summary:  Briana Sweeney is a 51 y.o. female with a past medical history of alcohol use disorder, polysubstance use disorder, cirrhosis, hepatitis C, GERD, and seizures who presented to the emergency room with concerns of bilateral lower extremity pain and erythema.  MRI showed evidence of right lower extremity osteomyelitis and myositis, and patient admitted for further evaluation and management.  #Right lower extremity osteomyelitis #Right posterior calf myositis #Left lower extremity cellulitis Patient endorses right lower extremity pain on exam this morning.  Patient denies any drainage or any worsening redness.  On exam, patient does have open wound noted to right lower extremity, with no signs of drainage, or erythema.  Patient does have tenderness noted to right lower extremity as well as her left lower extremity.  With some erythema noted.  Today will be day 4 of ceftriaxone.  Plan to switch from vancomycin to  doxycycline in the setting of concern for AKI.  Orthopedics evaluated patient today, stating that they will not intervene and will follow her up outpatient.  -Ortho following, outpatient follow up on 09/10/2022 -Day 1 doxycycline, total day 5 antibiotics -Day 4 ceftriaxone of 5  -Resume diet -Monitor fever curve  #AKI Creatinine increased again today.  Concern now for hepatorenal syndrome.  Also concern for ATN secondary to hypotension.  Also concern for nephrotoxicity from vancomycin, although unlikely.  Will need to monitor creatinine closely.  Patient has been off of diuretics, beta-blocker, and blood pressures still decreased. -Monitor BMP -Start albumin 1 g/kg for the next 2 days -Monitor blood pressure -Avoid nephrotoxic medications  #Hypotension Blood pressure remains decreased with maps ranging between 60 and 65.  Systolics in the low 62X and high 80s.  Multifactorial in the setting of acute infection, decompensated liver cirrhosis, poor p.o. intake, and BKA.  Patient is off of spironolactone, Lasix, and nadolol.  On exam, patient does have edema noted to bilateral lower extremities.  Would not benefit from any more fluids. -Increase midodrine to 15 mg 3 times daily -Start albumin 1 g/kg for the next 2 days -Monitor blood pressure closely -Low threshold for PCCM consult for pressors   #Decompensated liver cirrhosis #Hepatic encephalopathy #Alcohol use disorder Patient presents with decompensated liver cirrhosis.  Patient had paracentesis 2 days ago with removal of 1.3 L of fluid.  Unable to calculate SAG given albumin of fluid less than 1.5.  MELD score 17.  Child Pugh class C.  Patient currently off of nadolol, spironolactone, Lasix given hypotensive.   On exam, patient may have mild asterixis. There is slight distention abdomen.  Patient has had 2 bowel movements yesterday.  Will plan to continue with lactulose today.  Cultures continue to have no growth. -Continue to hold  spironolactone, Lasix, nadolol -Continue lactulose, goal of 3 bowel movements per day -Continue rifaximin -Consider paracentesis if needed, does not seem needed today -Continue thiamine supplementation -Continue ceftriaxone day 4 of 5  #Hyponatremia, resolved Sodium back to normal today at 135. -Continue to monitor CMP  #Acute psychosis likely secondary to substance use #Anxiety/depression No concerns currently. -Continue Abilify 15 mg daily -Continue Klonopin 0.5 mg twice daily -Continue Cymbalta 60 mg daily -Continue gabapentin 300 mg 3 times daily -Psychiatry following -Haldol and Ativan as needed for agitation -If pressures continue to drop, consider removing some centrally acting medications  #GERD Chronic -Continue pantoprazole 40 mg daily  Diet: 2 gram sodium diet  IVF: None  VTE: Enoxaparin Code: Full PT/OT recs: SNF for Subacute PT   Dispo: Anticipated discharge to  Home in 3 days pending clinical improvement.   Of note, dispo will be difficult given patient insurance not appropriate for SNF.  Patient's history of alcohol use and polysubstance use, not appropriate for home health. Another concern is prognosis is not too well, and plan to talk to patient about prognosis tomorrow.   [place] [facility] Resident PGY-1 [phone] Please contact the on call pager after 5 pm and on weekends at [phone].

## 2022-09-06 NOTE — Consult Note (Signed)
ORTHOPAEDIC CONSULTATION  REQUESTING PHYSICIAN: Lottie Mussel, MD  Chief Complaint: Cellulitis swelling of both lower extremities status post right transtibial amputation.  HPI: Briana Sweeney is a 51 y.o. female who presents with cellulitis and ulceration with swelling of both lower extremities.  Past Medical History:  Diagnosis Date   Abscess of bursa of left elbow 09/23/2018   Acute hyperactive alcohol withdrawal delirium ([place]) 10/09/2017   Acute low back pain with bilateral sciatica    Acute metabolic encephalopathy 14/48/1856   Acute on chronic pancreatitis ([place]) 04/04/2021   AKI (acute kidney injury) ([place]) 04/04/2021   Alcohol withdrawal syndrome with complication ([place]) 31/49/7026   Alcohol-induced acute pancreatitis    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[place]) 12/05/2018   Ascites due to alcoholic cirrhosis ([place])    Bipolar affective disorder ([place])    With anxiety features   Bunionette of right foot 11/2019   Cirrhosis of liver ([place])    Due to alcohol and hepatitis C   Cocaine dependence without complication ([place]) 37/85/8850   Decompensated hepatic cirrhosis ([place]) 07/23/2017   Depression with anxiety    Epistaxis 01/20/2019   Erysipelas 09/13/2018   Esophageal 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 ([place])    Esophageal varices without bleeding (HCC)    ETOHism (HCC)    GERD (gastroesophageal reflux disease)    Head injury    Hematemesis 02/10/2018   Hepatitis C 2018   hepatitis c and alcohol related hepatitis   Heroin abuse ([place])    History of blood transfusion    "blood doesn't clot; I fell down and had to have a transfusion"   History of kidney stones    Menopause 2016   Migraine    "when I get really stressed" (09/01/2017)   Neuropathy 10/27/2018   Olecranon bursitis of left elbow    Other mixed anxiety disorders 10/27/2018   Overdose 08/22/2018   Pancytopenia ([place]) 07/23/2017   Polysubstance abuse ([place]) 04/08/2018   Portal hypertension ([place]) 03/18/2018   S/P foot surgery,  right 11/2019   Schizophrenia ([place])    Seizures ([place])    "when I run out of my RX; lots recently" (09/01/2017)   Suicidal ideation    Thrombocytopenia ([place])    Trichimoniasis 10/30/2017   Upper GI bleed 02/10/2018   Urinary urgency 05/2020   UTI (urinary tract infection) 06/02/2017   Past Surgical History:  Procedure Laterality Date   AMPUTATION Right 07/02/2022   Procedure: RIGHT BELOW KNEE AMPUTATION;  Surgeon: Newt Minion, MD;  Location: [place];  Service: Orthopedics;  Laterality: Right;   APPLICATION OF WOUND VAC Right 07/02/2022   Procedure: APPLICATION OF WOUND VAC;  Surgeon: Newt Minion, MD;  Location: [facility];  Service: Orthopedics;  Laterality: Right;   BUNIONECTOMY     ESOPHAGEAL BANDING  06/26/2021   Procedure: ESOPHAGEAL BANDING;  Surgeon: Jackquline Denmark, MD;  Location: [facility] ENDOSCOPY;  Service: Endoscopy;;   ESOPHAGOGASTRODUODENOSCOPY N/A 09/03/2017   Procedure: ESOPHAGOGASTRODUODENOSCOPY (EGD);  Surgeon: Doran Stabler, MD;  Location: [place] [facility];  Service: Gastroenterology;  Laterality: N/A;   ESOPHAGOGASTRODUODENOSCOPY N/A 02/01/2022   Procedure: ESOPHAGOGASTRODUODENOSCOPY (EGD);  Surgeon: Clarene Essex, MD;  Location[facility];  Service: Gastroenterology;  Laterality: N/A;   ESOPHAGOGASTRODUODENOSCOPY (EGD) WITH PROPOFOL N/A 06/26/2021   Procedure: ESOPHAGOGASTRODUODENOSCOPY (EGD) WITH PROPOFOL;  Surgeon: Jackquline Denmark, MD;  Location: [facility] ENDOSCOPY;  Service: Endoscopy;  Laterality: N/A;   FINGER FRACTURE SURGERY Left    "shattered my pinky"   FRACTURE SURGERY     I & D EXTREMITY Left  09/18/2018   Procedure: IRRIGATION AND DEBRIDEMENT EXTREMITY;  Surgeon: Leanora Cover, MD;  Location: [facility];  Service: Orthopedics;  Laterality: Left;   IR PARACENTESIS  07/23/2017   IR PARACENTESIS  07/2017   "did it twice in the same week" (09/01/2017)   IR PARACENTESIS  06/26/2021   IR PARACENTESIS  07/10/2022   IR PARACENTESIS  09/04/2022   SHOULDER OPEN ROTATOR CUFF REPAIR  Right    TOOTH EXTRACTION  08/2019   TUBAL LIGATION     VAGINAL HYSTERECTOMY     Social History   Socioeconomic History   Marital status: Divorced    Spouse name: Not on file   Number of children: 2   Years of education: Not on file   Highest education level: Not on file  Occupational History   Occupation: applying for disability  Tobacco Use   Smoking status: Never   Smokeless tobacco: Never  Vaping Use   Vaping Use: Never used  Substance and Sexual Activity   Alcohol use: Yes    Alcohol/week: 6.0 standard drinks of alcohol    Types: 6 Cans of beer per week    Comment: pt states she had 1 can beer on 02/26/22   Drug use: Not Currently   Sexual activity: Not on file  Other Topics Concern   Not on file  Social History Narrative   She moved with a boyfriend to [place] and was followed at [facility].  He died of a massive heart attack in 8/18, per her report, and so she moved back to [place] and is living with a friend.      Right handed   2 story home    Lives alone 11/09/19   Social Determinants of Health   Financial Resource Strain: Not on file  Food Insecurity: No Food Insecurity (05/14/2022)   Hunger Vital Sign    Worried About Running Out of Food in the Last Year: Never true    Ran Out of Food in the Last Year: Never true  Transportation Needs: No Transportation Needs (05/14/2022)   PRAPARE - Hydrologist (Medical): No    Lack of Transportation (Non-Medical): No  Physical Activity: Not on file  Stress: Not on file  Social Connections: Not on file   Family History  Problem Relation Age of Onset   Lung cancer Mother 40   Alcohol abuse Mother    Throat cancer Father 3   - negative except otherwise stated in the family history section Allergies  Allergen Reactions   Other Other (See Comments)    Platelets: Rx chest pain, tremors, body aches  Patient stated that she receives platelet injection every Wednesday    Prior  to Admission medications   Medication Sig Start Date End Date Taking? Authorizing Provider  acamprosate (CAMPRAL) 333 MG tablet Take 2 tablets (666 mg total) by mouth 3 (three) times daily 04/09/22     acetaminophen (TYLENOL) 325 MG tablet Take 1-2 tablets (325-650 mg total) by mouth every 6 (six) hours as needed for mild pain. 07/22/22   Setzer, Edman Circle, PA-C  ARIPiprazole (ABILIFY) 10 MG tablet Take 1 tablet (10 mg total) by mouth daily. 07/23/22   Setzer, Edman Circle, PA-C  ARIPiprazole (ABILIFY) 15 MG tablet Take 1 tablet (15 mg total) by mouth daily. 08/15/22     ascorbic acid (VITAMIN C) 1000 MG tablet Take 1 tablet (1,000 mg total) by mouth daily. 07/23/22   Setzer, Edman Circle, PA-C  clonazePAM (  KLONOPIN) 0.5 MG tablet Take 1 tablet (0.5 mg total) by mouth 2 (two) times daily. 08/15/22     DULoxetine (CYMBALTA) 60 MG capsule Take 1 capsule (60 mg total) by mouth at bedtime. 08/26/22   Kirsteins, Luanna Salk, MD  FLUoxetine (PROZAC) 20 MG capsule Take 3 capsules (60 mg total) by mouth every morning. 77/9/39     folic acid (FOLVITE) 1 MG tablet Take 1 tablet (1 mg total) by mouth daily. 07/23/22   Setzer, Edman Circle, PA-C  furosemide (LASIX) 40 MG tablet Take 1 tablet (40 mg total) by mouth daily. 07/22/22   Setzer, Edman Circle, PA-C  gabapentin (NEURONTIN) 300 MG capsule Take 2 capsules (600 mg total) by mouth 3 (three) times daily. 07/22/22   Setzer, Edman Circle, PA-C  hydrocortisone (ANUSOL-HC) 2.5 % rectal cream Place rectally 2 (two) times daily as needed for hemorrhoids or anal itching. 07/22/22   Setzer, Edman Circle, PA-C  HYDROmorphone (DILAUDID) 4 MG tablet Take 1 tablet (4 mg total) by mouth 2 (two) times daily as needed for severe pain. 08/08/22   Curatolo, Adam, DO  lactulose (CHRONULAC) 10 GM/15ML solution Take 30 mLs (20 g total) by mouth 2 (two) times daily. 07/22/22   Setzer, Edman Circle, PA-C  levETIRAcetam (KEPPRA) 750 MG tablet Take 1 tablet (750 mg total) by mouth 2 (two) times daily. 07/22/22    Setzer, Edman Circle, PA-C  Multiple Vitamin (MULTIVITAMIN WITH MINERALS) TABS tablet Take 1 tablet by mouth daily. 07/23/22   Setzer, Edman Circle, PA-C  nadolol (CORGARD) 20 MG tablet Take 1 tablet (20 mg total) by mouth every morning. 07/23/22   Setzer, Edman Circle, PA-C  nutrition supplement, JUVEN, (JUVEN) PACK Take 1 packet by mouth 2 (two) times daily between meals. 07/22/22   Setzer, Edman Circle, PA-C  pantoprazole (PROTONIX) 40 MG tablet Take 1 tablet (40 mg total) by mouth daily. 07/22/22   Setzer, Edman Circle, PA-C  spironolactone (ALDACTONE) 100 MG tablet Take 1 tablet (100 mg total) by mouth daily. 07/23/22   Setzer, Edman Circle, PA-C  thiamine (VITAMIN B-1) 100 MG tablet Take 1 tablet (100 mg total) by mouth daily. 07/23/22   Setzer, Edman Circle, PA-C  traZODone (DESYREL) 50 MG tablet Take 1 tablet (50 mg total) by mouth at bedtime as needed for sleep 08/15/22      MR TIBIA FIBULA RIGHT WO CONTRAST  Result Date: 09/04/2022 CLINICAL DATA:  Soft tissue infection suspected. EXAM: MRI OF LOWER RIGHT EXTREMITY WITHOUT CONTRAST TECHNIQUE: Multiplanar, multisequence MR imaging of the right lower extremity was performed. No intravenous contrast was administered. COMPARISON:  Radiographs dated September 02, 2022 and MR knee examination performed earlier on the same date. FINDINGS: Bones/Joint/Cartilage There is marrow edema about the stump site concerning for osteomyelitis. Degenerative changes of the knee joint. Small knee joint effusion. Ligaments Intact Muscles and Tendons There is marked edema about the posterior calf muscles suggesting myositis. No intramuscular fluid collection or abscess. Soft tissues Skin wound about the anterior aspect of the stump site with marked surrounding subcutaneous edema prominent about the anterior aspect of the stump. Skin thickening. No appreciable fluid collection. IMPRESSION: 1. Skin wound about the anterior aspect of the stump site with marked surrounding subcutaneous edema and skin  thickening concerning for cellulitis. No appreciable fluid collection or abscess. 2. Marrow edema about the stump site concerning for osteomyelitis. 3. Marked edema about the posterior calf muscles suggesting myositis. No intramuscular fluid collection or abscess. Electronically Signed   By: Keane Police  D.O.   On: 09/04/2022 23:00   MR KNEE RIGHT WO CONTRAST  Result Date: 09/04/2022 CLINICAL DATA:  Leg swelling.  Osteomyelitis suspected. EXAM: MRI OF THE RIGHT KNEE WITHOUT CONTRAST TECHNIQUE: Multiplanar, multisequence MR imaging of the knee was performed. No intravenous contrast was administered. COMPARISON:  Right knee radiographs 09/02/2022, right tibia and fibula radiographs 08/08/2022 FINDINGS: This patient is status post amputation of the right lower extremity to the level of the proximal to mid tibial and fibular diaphysis. Please note that the current knee MRI does not include the distal amputation stump. Despite efforts by the technologist and patient, motion artifact is present on today's exam and could not be eliminated. This reduces exam sensitivity and specificity. MENISCI Medial meniscus: Within the limitations of moderate to high-grade patient motion artifact, there is truncation of the central third of the meniscal triangle of the mid transverse dimension of the posterior horn of the medial meniscus (sagittal series 5, image 23), likely chronic/degenerative radial tear. Lateral meniscus: Within limitations of motion artifact, no definite lateral meniscal tear is seen. LIGAMENTS Cruciates: The ACL and PCL are intact. Collaterals: The medial collateral ligament is intact. The fibular collateral ligament, biceps femoris tendon, iliotibial band, and popliteus tendon are intact. CARTILAGE Patellofemoral: No gross cartilage defect, though motion artifact limits evaluation. Medial: Focal full-thickness cartilage loss within the mid weight-bearing medial femoral condyle with minimal subchondral marrow  edema (coronal series 7, image 10). Lateral:  No gross cartilage defect. Joint: Nojoint effusion. Normal Hoffa's fat pad. No plical thickening. The tibial tuberosity-trochlear groove distance measures 7 mm, within normal limits. Popliteal Fossa:  No Baker's cyst. Extensor Mechanism:  Intact quadriceps tendon and patellar tendon. Bones: No acute fracture or dislocation. Within the limitations of patient motion artifact, no cortical erosion is seen to indicate acute osteomyelitis. Other: Mild edema and swelling of the anterolateral knee subcutaneous fat. There is edema seen within the partially visualized proximal posterior tibial muscle and soleus muscle (axial series 2, images 14-30 of 30). IMPRESSION: 1. Status post amputation of the right lower extremity to the level of the proximal to mid tibial and fibular diaphysis. Please note that the current knee MRI does not include the distal amputation stump. 2. No definite acute osteomyelitis is seen within the visualized region of the knee, excluding the patient's stump. 3. Focal full-thickness cartilage loss within the mid weight-bearing medial femoral condyle with minimal subchondral marrow edema. 4. Likely chronic/degenerative radial tear of the central third of the meniscal triangle of the mid transverse dimension of the posterior horn of the medial meniscus. 5. Edema within the posterior tibial and soleus musculature extending inferiorly. No walled-off abscess is visualized. Electronically Signed   By: Yvonne Kendall M.D.   On: 09/04/2022 17:27   IR Paracentesis  Result Date: 09/04/2022 INDICATION: Patient with history of cirrhosis and recurrent ascites. Request for diagnostic and therapeutic paracentesis. EXAM: ULTRASOUND GUIDED DIAGNOSTIC AND THERAPEUTIC LEFT LOWER QUADRANT PARACENTESIS MEDICATIONS: 10 mL 1 % lidocaine COMPLICATIONS: None immediate. PROCEDURE: Informed written consent was obtained from the patient after a discussion of the risks, benefits and  alternatives to treatment. A timeout was performed prior to the initiation of the procedure. Initial ultrasound scanning demonstrates a large amount of ascites within the left lower abdominal quadrant. The left lower abdomen was prepped and draped in the usual sterile fashion. 1% lidocaine was used for local anesthesia. Following this, a 19 gauge, 7-cm, Yueh catheter was introduced. After 1.3 L were removed, patient had slight drop in BP  and procedure was stopped. An ultrasound image was saved for documentation purposes. The paracentesis was performed. The catheter was removed and a dressing was applied. The patient tolerated the procedure well without immediate post procedural complication. FINDINGS: A total of approximately 1.3 L of pale, yellow fluid was removed. Samples were sent to the laboratory as requested by the clinical team. IMPRESSION: Successful ultrasound-guided paracentesis yielding 1.3 liters of peritoneal fluid. Read by: Narda Rutherford, AGNP-BC Electronically Signed   By: Miachel Roux M.D.   On: 09/04/2022 13:48   - pertinent xrays, CT, MRI studies were reviewed and independently interpreted  Positive ROS: All other systems have been reviewed and were otherwise negative with the exception of those mentioned in the HPI and as above.  Physical Exam: General: Alert, no acute distress Psychiatric: Patient is competent for consent with normal mood and affect Lymphatic: No axillary or cervical lymphadenopathy Cardiovascular: No pedal edema Respiratory: No cyanosis, no use of accessory musculature GI: No organomegaly, abdomen is soft and non-tender    Images:  '@ENCIMAGES'$ @  Labs:  Lab Results  Component Value Date   HGBA1C 4.9 05/26/2020   HGBA1C 4.9 05/26/2020   HGBA1C 4.9 (A) 05/26/2020   HGBA1C 4.9 05/26/2020   ESRSEDRATE 12 09/02/2022   CRP 4.4 (H) 09/02/2022   REPTSTATUS PENDING 09/04/2022   REPTSTATUS 09/04/2022 FINAL 09/04/2022   GRAMSTAIN  09/04/2022    WBC PRESENT,  PREDOMINANTLY MONONUCLEAR NO ORGANISMS SEEN CYTOSPIN SMEAR Performed at [facility], [place] [address] [place], [place] [address]    CULT  09/04/2022    NO GROWTH < 24 HOURS Performed at [facility] [address] [place], [place] [address]    LABORGA METHICILLIN RESISTANT STAPHYLOCOCCUS AUREUS 06/29/2022    Lab Results  Component Value Date   ALBUMIN 1.9 (L) 09/05/2022   ALBUMIN 2.1 (L) 09/03/2022   ALBUMIN 2.2 (L) 09/02/2022   PREALBUMIN <5 (L) 01/23/2022        Latest Ref Rng & Units 09/05/2022    1:12 AM 09/04/2022    3:13 AM 09/02/2022    2:30 PM  CBC EXTENDED  WBC 4.0 - 10.5 K/uL 8.0  3.9  4.2   RBC 3.87 - 5.11 MIL/uL 4.19  4.29  4.82   Hemoglobin 12.0 - 15.0 g/dL 12.3  12.9  14.5   HCT 36.0 - 46.0 % 39.1  37.6  43.1   Platelets 150 - 400 K/uL 82  105  138   NEUT# 1.7 - 7.7 K/uL   2.1   Lymph# 0.7 - 4.0 K/uL   1.4     Neurologic: Patient does not have protective sensation bilateral lower extremities.   MUSCULOSKELETAL:   Skin: Examination patient's swelling is resolving but she still has pitting edema both lower extremities.  Patient cellulitis has almost completely resolved after IV antibiotics.  Patient has persistent ulcerations on both lower extremities.  Review of the MRI scan shows cellulitis of the right below-knee amputation and edema in the bone which may be consistent with osteomyelitis.  Left foot has swelling with a palpable dorsalis pedis pulse no plantar ulcers.  Assessment: Assessment: Resolving cellulitis right below-knee amputation with improved swelling of both lower extremities as well.  Plan: Plan: Recommend patient resume using her Vive wear stump shrinker on the right around-the-clock and use her knee-high compression socks on the left.  I will follow-up in office in 1 week.  Recommend discharge on oral doxycycline.  Thank you for the consult and the opportunity  to see Ms. Barton Fanny, [place] [phone] 7:34 AM

## 2022-09-06 NOTE — Discharge Instructions (Signed)
                  Intensive Outpatient Programs  [facility]    [facility] [address]     [address] [place],  [place]     [place], [place] [phone]      [phone]  [facility]   [facility]  (Inpatient and outpatient)  [phone] (Suboxone and Methadone) [address]           [phone]           [facility]    [facility] - Intensive Outpatient [address]     [address] [place], [place] [address]     [place], [place]  [phone]      [phone]  [facility] (Outpatient, Inpatient, [facility]  [facility] (Groups and [facility]) (insurance only) [phone]    [place], [place]          [phone]       [facility]    [facility] (for caregivers and family) [address]     [address] [place], [place]     [place], [place] [phone]      [phone]  Residential Treatment Programs  [facility]  [facility](2 years) Residential: 90 days)  [facility] [address]      [address] [place], [place]     [place], [place] [phone]      [phone] or [phone]  [facility]    [facility] [address]      [address] [place], [place]     [place], [place] [phone]      [phone]  [facility]   [facility] ([facility]) [address]     [address] [place], [place] [address]     [place], [place] [phone]      [phone] Admissions: 8am-3pm M-F  [facility]: Residential Program (90 Days)              [facility]: [facility]  [place], [place]     [place],   [phone] or [phone]    (Walk in Hours over the weekend or by referral)   [facility]: [facility]:[phone] (for crisis  response 24 hours a day) 

## 2022-09-06 NOTE — Progress Notes (Signed)
Mobility Specialist - Progress Note   09/06/22 1557  Mobility  Activity Transferred from chair to bed  Level of Assistance Moderate assist, patient does 50-74%  Assistive Device Front wheel walker  Distance Ambulated (ft) 2 ft  Activity Response Tolerated well  Mobility Referral Yes  $Mobility charge 1 Mobility    Pt received in recliner requesting to get back to bed. ModA to stand from recliner and balance while "hop" pivoting. ModA to sit EOB safely from standing. Left in bed w/ RN at bed side and all needs met.   [place] Specialist Please contact via SecureChat or Rehab office at [phone]

## 2022-09-06 NOTE — Progress Notes (Signed)
Mobility Specialist - Progress Note   09/06/22 1518  Mobility  Activity Transferred from bed to chair  Level of Assistance Minimal assist, patient does 75% or more  Assistive Device Front wheel walker  Distance Ambulated (ft) 2 ft  Activity Response Tolerated well  Mobility Referral Yes  $Mobility charge 1 Mobility    Pt received in bed agreeable to mobility. MinA needed to stand upright and maintain balance during transfer. Left in recliner w/ call bell in her lap and all needs met.   [place] Specialist Please contact via SecureChat or Rehab office at [phone]

## 2022-09-06 NOTE — Social Work (Signed)
CSW acknowledges consult for SU counseling and resources. Pt continues to be disoriented, resources added to AVS for her convenience. TOC will continue to follow to provide counseling when appropriate.  CSW discussed SNF recommendation with team. Per chart pt has a history of ETOH and SU, as well as a psych history. Pt also has an insurance that is out of network and does not tend to be accepted for SNF placement. Pt has a BKA and utilizes assistive devices at baseline. SNF placement may not be possible or appropriate when pt's mentation clears, unable to complete assessment with pt at this time. CM also acknowledging that these will likely be barriers to [facility] services as well. Medical team notes understanding of barriers. TOC will continue to follow for DC needs.

## 2022-09-07 DIAGNOSIS — N179 Acute kidney failure, unspecified: Secondary | ICD-10-CM | POA: Diagnosis not present

## 2022-09-07 DIAGNOSIS — K7460 Unspecified cirrhosis of liver: Secondary | ICD-10-CM | POA: Diagnosis not present

## 2022-09-07 DIAGNOSIS — F1090 Alcohol use, unspecified, uncomplicated: Secondary | ICD-10-CM

## 2022-09-07 DIAGNOSIS — K7290 Hepatic failure, unspecified without coma: Secondary | ICD-10-CM | POA: Diagnosis not present

## 2022-09-07 LAB — CBC
HCT: 33.5 % — ABNORMAL LOW (ref 36.0–46.0)
Hemoglobin: 11.2 g/dL — ABNORMAL LOW (ref 12.0–15.0)
MCH: 29.8 pg (ref 26.0–34.0)
MCHC: 33.4 g/dL (ref 30.0–36.0)
MCV: 89.1 fL (ref 80.0–100.0)
Platelets: 38 10*3/uL — ABNORMAL LOW (ref 150–400)
RBC: 3.76 MIL/uL — ABNORMAL LOW (ref 3.87–5.11)
RDW: 17.3 % — ABNORMAL HIGH (ref 11.5–15.5)
WBC: 5.3 10*3/uL (ref 4.0–10.5)
nRBC: 0 % (ref 0.0–0.2)

## 2022-09-07 LAB — TECHNOLOGIST SMEAR REVIEW: Plt Morphology: DECREASED

## 2022-09-07 LAB — COMPREHENSIVE METABOLIC PANEL
ALT: 16 U/L (ref 0–44)
AST: 43 U/L — ABNORMAL HIGH (ref 15–41)
Albumin: 2.6 g/dL — ABNORMAL LOW (ref 3.5–5.0)
Alkaline Phosphatase: 110 U/L (ref 38–126)
Anion gap: 10 (ref 5–15)
BUN: 7 mg/dL (ref 6–20)
CO2: 24 mmol/L (ref 22–32)
Calcium: 8.2 mg/dL — ABNORMAL LOW (ref 8.9–10.3)
Chloride: 101 mmol/L (ref 98–111)
Creatinine, Ser: 2.58 mg/dL — ABNORMAL HIGH (ref 0.44–1.00)
GFR, Estimated: 22 mL/min — ABNORMAL LOW (ref >60)
Glucose, Bld: 118 mg/dL — ABNORMAL HIGH (ref 70–99)
Potassium: 3.6 mmol/L (ref 3.5–5.1)
Sodium: 135 mmol/L (ref 135–145)
Total Bilirubin: 1.3 mg/dL — ABNORMAL HIGH (ref 0.3–1.2)
Total Protein: 5.2 g/dL — ABNORMAL LOW (ref 6.5–8.1)

## 2022-09-07 LAB — CULTURE, BLOOD (ROUTINE X 2)
Culture: NO GROWTH
Culture: NO GROWTH
Special Requests: ADEQUATE
Special Requests: ADEQUATE

## 2022-09-07 LAB — VITAMIN B1: Vitamin B1 (Thiamine): 94.3 nmol/L (ref 66.5–200.0)

## 2022-09-07 LAB — FIBRINOGEN: Fibrinogen: 156 mg/dL — ABNORMAL LOW (ref 210–475)

## 2022-09-07 MED ORDER — GABAPENTIN 100 MG PO CAPS
200.0000 mg | ORAL_CAPSULE | Freq: Three times a day (TID) | ORAL | Status: DC
  Administered 2022-09-07 (×2): 200 mg via ORAL
  Filled 2022-09-07 (×2): qty 2

## 2022-09-07 MED ORDER — SODIUM CHLORIDE 0.9 % IV SOLN
2.0000 g | INTRAVENOUS | Status: AC
  Administered 2022-09-07: 2 g via INTRAVENOUS
  Filled 2022-09-07: qty 20

## 2022-09-07 MED ORDER — OCTREOTIDE ACETATE 100 MCG/ML IJ SOLN
150.0000 ug | Freq: Two times a day (BID) | INTRAMUSCULAR | Status: DC
  Administered 2022-09-08 – 2022-09-10 (×6): 150 ug via SUBCUTANEOUS
  Filled 2022-09-07 (×7): qty 1.5

## 2022-09-07 MED ORDER — GABAPENTIN 300 MG PO CAPS
300.0000 mg | ORAL_CAPSULE | Freq: Three times a day (TID) | ORAL | Status: DC
  Administered 2022-09-07: 300 mg via ORAL
  Filled 2022-09-07: qty 1

## 2022-09-07 MED ORDER — LACTULOSE 10 GM/15ML PO SOLN
10.0000 g | Freq: Three times a day (TID) | ORAL | Status: DC
  Administered 2022-09-07 – 2022-09-10 (×9): 10 g via ORAL
  Filled 2022-09-07 (×9): qty 15

## 2022-09-07 MED ORDER — ALBUMIN HUMAN 25 % IV SOLN
63.0000 g | Freq: Once | INTRAVENOUS | Status: AC
  Administered 2022-09-07: 62.5 g via INTRAVENOUS
  Filled 2022-09-07: qty 300

## 2022-09-07 NOTE — Consult Note (Addendum)
Renal Service Consult Note [facility]  Briana Sweeney 09/07/2022 Sol Blazing, MD Requesting Physician: Dr. Cain Sieve  Reason for Consult: Renal failure HPI: The patient is a 51 y.o. year-old w/ PMH as below who presented 12/25 w/ worsening LE edema, hx of recent R BKA. Some recent confusion, pt lives alone, w/ hx of cirrhosis, hx etoh and substance abuse in the past. Creat was 0.7 on admission and has worsened up to 1.9 yest and 2.5 today. BP's have been soft off and on since admission, 80s- 90s mostly, occ SBP > 110. Lasix, aldactone and nadolol were used the 1st two days of the hospital stay then dc'd. Midodrine and IV albumin were added but creatinine continues to climb. We are asked to see for renal failure.   Pt had a LVP on 12/27 here done by IR for 1.3 L . Pt rec'd 63gm IV albumin on 12/29 and today, 25 gm on 12/28.  UOP is not recorded. Wt's here are up 3kg but has not weighed the last 3 days. Pt is alert, responsive. No c/o's at this time except leg swelling.   ROS - denies CP, no joint pain, no HA, no blurry vision, no rash, no diarrhea, no nausea/ vomiting, no dysuria, no difficulty voiding   Past Medical History  Past Medical History:  Diagnosis Date   Abscess of bursa of left elbow 09/23/2018   Acute hyperactive alcohol withdrawal delirium ([place]) 10/09/2017   Acute low back pain with bilateral sciatica    Acute metabolic encephalopathy 97/10/6376   Acute on chronic pancreatitis ([place]) 04/04/2021   AKI (acute kidney injury) ([place]) 04/04/2021   Alcohol withdrawal syndrome with complication ([place]) 58/85/0277   Alcohol-induced acute pancreatitis    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[place]) 12/05/2018   Ascites due to alcoholic cirrhosis (HCC)    Bipolar affective disorder ([place])    With anxiety features   Bunionette of right foot 11/2019   Cirrhosis of liver ([place])    Due to alcohol and hepatitis C   Cocaine dependence without complication ([place]) 41/28/7867    Decompensated hepatic cirrhosis ([place]) 07/23/2017   Depression with anxiety    Epistaxis 01/20/2019   Erysipelas 09/13/2018   Esophageal 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 (HCC)    Esophageal varices without bleeding (HCC)    ETOHism (HCC)    GERD (gastroesophageal reflux disease)    Head injury    Hematemesis 02/10/2018   Hepatitis C 2018   hepatitis c and alcohol related hepatitis   Heroin abuse ([place])    History of blood transfusion    "blood doesn't clot; I fell down and had to have a transfusion"   History of kidney stones    Menopause 2016   Migraine    "when I get really stressed" (09/01/2017)   Neuropathy 10/27/2018   Olecranon bursitis of left elbow    Other mixed anxiety disorders 10/27/2018   Overdose 08/22/2018   Pancytopenia ([place]) 07/23/2017   Polysubstance abuse ([place]) 04/08/2018   Portal hypertension ([place]) 03/18/2018   S/P foot surgery, right 11/2019   Schizophrenia ([place])    Seizures ([place])    "when I run out of my RX; lots recently" (09/01/2017)   Suicidal ideation    Thrombocytopenia ([place])    Trichimoniasis 10/30/2017   Upper GI bleed 02/10/2018   Urinary urgency 05/2020   UTI (urinary tract infection) 06/02/2017   Past Surgical History  Past Surgical History:  Procedure Laterality Date   AMPUTATION Right 07/02/2022   Procedure: RIGHT BELOW KNEE  AMPUTATION;  Surgeon: Newt Minion, MD;  Location: [place];  Service: Orthopedics;  Laterality: Right;   APPLICATION OF WOUND VAC Right 07/02/2022   Procedure: APPLICATION OF WOUND VAC;  Surgeon: Newt Minion, MD;  Location: [place];  Service: Orthopedics;  Laterality: Right;   BUNIONECTOMY     ESOPHAGEAL BANDING  06/26/2021   Procedure: ESOPHAGEAL BANDING;  Surgeon: Jackquline Denmark, MD;  Location: [facility] ENDOSCOPY;  Service: Endoscopy;;   ESOPHAGOGASTRODUODENOSCOPY N/A 09/03/2017   Procedure: ESOPHAGOGASTRODUODENOSCOPY (EGD);  Surgeon: Doran Stabler, MD;  Location: [place];  Service: Gastroenterology;  Laterality: N/A;    ESOPHAGOGASTRODUODENOSCOPY N/A 02/01/2022   Procedure: ESOPHAGOGASTRODUODENOSCOPY (EGD);  Surgeon: Clarene Essex, MD;  Location: [facility];  Service: Gastroenterology;  Laterality: N/A;   ESOPHAGOGASTRODUODENOSCOPY (EGD) WITH PROPOFOL N/A 06/26/2021   Procedure: ESOPHAGOGASTRODUODENOSCOPY (EGD) WITH PROPOFOL;  Surgeon: Jackquline Denmark, MD;  Location: [facility] ENDOSCOPY;  Service: Endoscopy;  Laterality: N/A;   FINGER FRACTURE SURGERY Left    "shattered my pinky"   FRACTURE SURGERY     I & D EXTREMITY Left 09/18/2018   Procedure: IRRIGATION AND DEBRIDEMENT EXTREMITY;  Surgeon: Leanora Cover, MD;  Location: [facility];  Service: Orthopedics;  Laterality: Left;   IR PARACENTESIS  07/23/2017   IR PARACENTESIS  07/2017   "did it twice in the same week" (09/01/2017)   IR PARACENTESIS  06/26/2021   IR PARACENTESIS  07/10/2022   IR PARACENTESIS  09/04/2022   SHOULDER OPEN ROTATOR CUFF REPAIR Right    TOOTH EXTRACTION  08/2019   TUBAL LIGATION     VAGINAL HYSTERECTOMY     Family History  Family History  Problem Relation Age of Onset   Lung cancer Mother 59   Alcohol abuse Mother    Throat cancer Father 78   Social History  reports that she has never smoked. She has never used smokeless tobacco. She reports current alcohol use of about 6.0 standard drinks of alcohol per week. She reports that she does not currently use drugs. Allergies  Allergies  Allergen Reactions   Other Other (See Comments)    Platelets: Rx chest pain, tremors, body aches  Patient stated that she receives platelet injection every Wednesday    Home medications Prior to Admission medications   Medication Sig Start Date End Date Taking? Authorizing Provider  acamprosate (CAMPRAL) 333 MG tablet Take 2 tablets (666 mg total) by mouth 3 (three) times daily 04/09/22     acetaminophen (TYLENOL) 325 MG tablet Take 1-2 tablets (325-650 mg total) by mouth every 6 (six) hours as needed for mild pain. 07/22/22   Setzer, Edman Circle, PA-C   ARIPiprazole (ABILIFY) 10 MG tablet Take 1 tablet (10 mg total) by mouth daily. 07/23/22   Setzer, Edman Circle, PA-C  ARIPiprazole (ABILIFY) 15 MG tablet Take 1 tablet (15 mg total) by mouth daily. 08/15/22     ascorbic acid (VITAMIN C) 1000 MG tablet Take 1 tablet (1,000 mg total) by mouth daily. 07/23/22   Setzer, Edman Circle, PA-C  clonazePAM (KLONOPIN) 0.5 MG tablet Take 1 tablet (0.5 mg total) by mouth 2 (two) times daily. 08/15/22     DULoxetine (CYMBALTA) 60 MG capsule Take 1 capsule (60 mg total) by mouth at bedtime. 08/26/22   Kirsteins, Luanna Salk, MD  FLUoxetine (PROZAC) 20 MG capsule Take 3 capsules (60 mg total) by mouth every morning. 62/9/47     folic acid (FOLVITE) 1 MG tablet Take 1 tablet (1 mg total) by mouth daily. 07/23/22   Setzer,  Edman Circle, PA-C  furosemide (LASIX) 40 MG tablet Take 1 tablet (40 mg total) by mouth daily. 07/22/22   Setzer, Edman Circle, PA-C  gabapentin (NEURONTIN) 300 MG capsule Take 2 capsules (600 mg total) by mouth 3 (three) times daily. 07/22/22   Setzer, Edman Circle, PA-C  hydrocortisone (ANUSOL-HC) 2.5 % rectal cream Place rectally 2 (two) times daily as needed for hemorrhoids or anal itching. 07/22/22   Setzer, Edman Circle, PA-C  HYDROmorphone (DILAUDID) 4 MG tablet Take 1 tablet (4 mg total) by mouth 2 (two) times daily as needed for severe pain. 08/08/22   Curatolo, Adam, DO  lactulose (CHRONULAC) 10 GM/15ML solution Take 30 mLs (20 g total) by mouth 2 (two) times daily. 07/22/22   Setzer, Edman Circle, PA-C  levETIRAcetam (KEPPRA) 750 MG tablet Take 1 tablet (750 mg total) by mouth 2 (two) times daily. 07/22/22   Setzer, Edman Circle, PA-C  Multiple Vitamin (MULTIVITAMIN WITH MINERALS) TABS tablet Take 1 tablet by mouth daily. 07/23/22   Setzer, Edman Circle, PA-C  nadolol (CORGARD) 20 MG tablet Take 1 tablet (20 mg total) by mouth every morning. 07/23/22   Setzer, Edman Circle, PA-C  nutrition supplement, JUVEN, (JUVEN) PACK Take 1 packet by mouth 2 (two) times daily between meals.  07/22/22   Setzer, Edman Circle, PA-C  pantoprazole (PROTONIX) 40 MG tablet Take 1 tablet (40 mg total) by mouth daily. 07/22/22   Setzer, Edman Circle, PA-C  spironolactone (ALDACTONE) 100 MG tablet Take 1 tablet (100 mg total) by mouth daily. 07/23/22   Setzer, Edman Circle, PA-C  thiamine (VITAMIN B-1) 100 MG tablet Take 1 tablet (100 mg total) by mouth daily. 07/23/22   Setzer, Edman Circle, PA-C  traZODone (DESYREL) 50 MG tablet Take 1 tablet (50 mg total) by mouth at bedtime as needed for sleep 08/15/22        Vitals:   09/07/22 0651 09/07/22 0830 09/07/22 1315 09/07/22 1640  BP: (!) 88/44 (!) 96/55 (!) 91/57 122/88  Pulse:  75 63 66  Resp:  18  18  Temp:  98 F (36.7 C)  98.4 F (36.9 C)  TempSrc:  Oral    SpO2:  96% 98% 93%  Weight:      Height:       Exam Gen alert, no distress, chronically ill appearing No rash, cyanosis or gangrene Sclera anicteric, throat clear  No jvd or bruits Chest clear bilat to bases, no rales/ wheezing RRR no MRG Abd soft ntnd no mass or ascites +bs GU defer MS R BKA intact Ext 2+ diffuse bilat LE edema, no wounds or ulcers Neuro is alert, Ox 3 , nf, mild asterixis     Home meds include - abilify, cymbalta, prozac, lasix 40 qd, lactulose, keppra, nadolol, PPI, aldactone 100 qd, vits/ supps   I/O total = 4.6 L in and no UOP recorded = +4.6 L   RA 93%, 88- 96/ 55- 65 BP's  RR 18  HR 65     UA , UNa, UCr pending     Renal US pend   Assessment/ Plan: AKI - b/l creat on admission was 0.7 on 12/25. Creat rising now in the setting of hypotension and decompensated cirrhosis. 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 BP lowering meds and diuretics were appropriately stopped when creatinine bumped earlier during this stay. Is now getting midodrine (titrated from '10mg'$  tid to 15 gm tid today) and also IV albumin, would continue both. Get UA, urine lytes and renal US. Place foley catheter.  Will add sq octreotide as tx for HRS. Prognosis overall is poor. Would consider  palliative consultation.  Decompensated liver cirrhosis Hypotension - typical of cirrhotic physiology ETOH abuse disorder Hepatic encephalopathy      Kelly Splinter  MD 09/07/2022, 6:51 PM Recent Labs  Lab 09/05/22 0112 09/06/22 0643 09/07/22 0221  HGB 12.3 12.8 11.2*  ALBUMIN 1.9*  --  2.6*  CALCIUM 7.9* 8.2* 8.2*  CREATININE 1.09* 1.94* 2.58*  K 4.2 3.8 3.6   Inpatient medications:  ARIPiprazole  15 mg Oral Daily   clonazePAM  0.5 mg Oral BID   doxycycline  100 mg Oral Q12H   DULoxetine  60 mg Oral QHS   folic acid  1 mg Oral Daily   gabapentin  200 mg Oral TID   lactulose  10 g Oral TID   midodrine  15 mg Oral TID WC   multivitamin with minerals  1 tablet Oral Daily   pantoprazole  40 mg Oral Daily   rifaximin  550 mg Oral BID   thiamine  100 mg Oral Daily    haloperidol lactate, mouth rinse

## 2022-09-07 NOTE — Progress Notes (Signed)
Orthopedic Tech Progress Note Patient Details:  Briana Sweeney 1970/10/24 [id number] RLE stump shrinker has been ordered from [facility]. MD also requested a compression sock for the LLE which was also ordered.  Patient ID: Briana Sweeney, female   DOB: [birth year], 51 y.o.   MRN: [id number]  Jearld Lesch 09/07/2022, 1:52 PM

## 2022-09-07 NOTE — Progress Notes (Signed)
HD#5 Subjective:   Summary: This is a 51 year old female with a past medical history of alcohol use disorder, polysubstance use disorder, cirrhosis, hepatitis C, GERD, and seizures who presented to the emergency room with concerns of bilateral lower extremity pain and erythema.  MRI showed evidence of right lower extremity osteomyelitis and myositis, and patient admitted for further evaluation and management.  Throughout hospitalization, cirrhosis worsened, and led to worsening kidney injury.   Overnight Events: No overnight events   Patient evaluated at bedside this AM.  Patient states that she is doing well.  She reports she is using the bathroom well.  She states that she has had many bowel movements, and states he is going too much.  She denies any visual hallucinations, or auditory hallucinations.  She states that her pastor came by yesterday.  We did speak to the patient today, regarding her prognosis, and states that is not too good.  Patient became tearful on exam, understanding what is going on.  Patient was agreeable to meeting with palliative care to discuss goals of care.  Objective:  Vital signs in last 24 hours: Vitals:   09/06/22 0746 09/06/22 1502 09/06/22 2020 09/07/22 0549  BP: (!) 90/55 104/68 95/76 (!) 88/48  Pulse: 83 76 84 72  Resp: '16 17 16 16  '$ Temp: 98.2 F (36.8 C) 98.1 F (36.7 C) 98.7 F (37.1 C) 98.6 F (37 C)  TempSrc: Oral Oral Oral Oral  SpO2: 92% 99% 94% 94%  Weight:      Height:       Supplemental O2: Room Air SpO2: 94 %   Physical Exam:  Constitutional: Tearful on exam HENT: Nonerythematous conjunctiva Cardiovascular: regular rate and rhythm, no m/r/g Pulmonary/Chest: Crackles heard to bilateral lung bases. Abdomen: Distended slightly, with positive fluid wave.  Hyperactive bowel sounds. Extremities: Bilateral lower extremities with 2+ pitting edema noted.  Open wound noted to right lower stump with no signs of drainage or bleeding.  Filed  Weights   09/02/22 1327 09/03/22 0251  Weight: 60.8 kg 62.9 kg     Intake/Output Summary (Last 24 hours) at 09/07/2022 0629 Last data filed at 09/07/2022 0600 Gross per 24 hour  Intake 560 ml  Output 2 ml  Net 558 ml    Net IO Since Admission: 4,045.12 mL [09/07/22 0629]  Pertinent Labs:    Latest Ref Rng & Units 09/07/2022    2:21 AM 09/06/2022    6:43 AM 09/05/2022    1:12 AM  CBC  WBC 4.0 - 10.5 K/uL 5.3  8.2  8.0   Hemoglobin 12.0 - 15.0 g/dL 11.2  12.8  12.3   Hematocrit 36.0 - 46.0 % 33.5  37.9  39.1   Platelets 150 - 400 K/uL 38  50  82        Latest Ref Rng & Units 09/07/2022    2:21 AM 09/06/2022    6:43 AM 09/05/2022    1:12 AM  CMP  Glucose 70 - 99 mg/dL 118  93  111   BUN 6 - 20 mg/dL 7  5  <5   Creatinine 0.44 - 1.00 mg/dL 2.58  1.94  1.09   Sodium 135 - 145 mmol/L 135  135  133   Potassium 3.5 - 5.1 mmol/L 3.6  3.8  4.2   Chloride 98 - 111 mmol/L 101  97  100   CO2 22 - 32 mmol/L '24  26  22   '$ Calcium 8.9 - 10.3 mg/dL 8.2  8.2  7.9   Total Protein 6.5 - 8.1 g/dL 5.2     Total Bilirubin 0.3 - 1.2 mg/dL 1.3     Alkaline Phos 38 - 126 U/L 110     AST 15 - 41 U/L 43     ALT 0 - 44 U/L 16       Imaging: No results found.  Assessment/Plan:   Principal Problem:   Cellulitis of right lower extremity Active Problems:   Decompensated hepatic cirrhosis (HCC)   Delirium due to another medical condition   Osteomyelitis of right tibia [facility])   Patient Summary: Briana Sweeney is a 51 y.o. female with a past medical history of alcohol use disorder, polysubstance use disorder, cirrhosis, hepatitis C, GERD, and seizures who presented to the emergency room with concerns of bilateral lower extremity pain and erythema.  MRI showed evidence of right lower extremity osteomyelitis and myositis, and patient admitted for further evaluation and management.  Throughout hospitalization, cirrhosis worsened, and led to worsening kidney injury.  #Decompensated liver  cirrhosis #AKI, concern for hepatorenal syndrome #Hypotension #Hepatic encephalopathy #Alcohol use disorder Patient past medical history of liver cirrhosis, presents with decompensated liver cirrhosis.  Patient required paracentesis 3 days ago with removal of 1.3 L of fluid.  Patient SAAG was unable to be calculated.  Patient's current MELD score 17, child Pugh class C.  Today is day 2 of albumin, patient remains hypotensive, with maps into the low 60s and high 50s.  Kidney function continues to decline despite holding diuretics, antihypertensives, beta-blockers, and adding albumin.  Today's last day of ceftriaxone.  On exam, patient has distention noted to her abdomen, with positive fluid wave.  Patient is alert and oriented x 3.  Explained to patient the seriousness of her illness, and patient becomes tearful on exam.  Patient is concerned right now includes hepatorenal syndrome.  Given her decompensated cirrhosis, I introduced the idea of palliative care to the patient, and patient is agreeable to speak with palliative care team.  Plan will be to continue to monitor patient's blood pressure, kidney function, and liver function.  Low threshold for PCCM evaluation for pressors if MAPs remain less than 60.  AKI likely secondary to ATN in the setting of hypotension versus hepatorenal syndrome. Will obtain fibrinogen level to rule out DIC, but less likely as patient has no signs of bleeding.  Plan to consult palliative and nephrology. -Decrease lactulose, goal of 3 bowel movements per day -Continue rifaximin -Continue to hold spironolactone, Lasix, nadolol -Continue albumin -Continue to monitor CMP -Continue thiamine supplementation -Continue ceftriaxone day 5 for 5 -Strict I's and O's -Paracentesis would not be appropriate given hypotension -UA pending -Obtain fibrinogen level -Avoid nephrotoxic agents -Nephrology following -Palliative care following -Monitor blood pressure closely -Consult PCCM  if MAPs remain less than 60  #Right lower extremity osteomyelitis #Right posterior calf myositis #Left lower extremity cellulitis Patient states that her right lower extremity pain is better this morning.  She denies any drainage, worsening redness, or any other concerns regarding her lower extremity.  On exam, patient's wound remained stable, with no evidence of worsening or bleeding.  It does have some erythematous borders, but not worse than yesterday.  Today will be day 2 of doxycycline, with total day 6 of antibiotics.  Today will be day 5 of 5 of ceftriaxone.  Patient has close follow-up with Dr. Sharol Given on 09/10/2021 in the outpatient setting.  -Ortho following, outpatient follow up on 09/10/2022 -Day 2 doxycycline, total day  6 antibiotics -Day 5 ceftriaxone of 5  -Monitor fever curve -Stump Shrinker and compression stockings  #Thrombocytopenia Platelet count has continued to drop during hospitalization down to 38,000.  Patient with no evidence of bleeding at this moment.  Platelets low likely in the setting of liver cirrhosis, portal hypertension and splenic sequestration. 4T score 2, less likely HIT. Will hold Lovenox today as patient platelet count is below 50k.  -Hold Lovenox until platelets up to 50k -Monitor CBC -Obtain smear   #Acute psychosis likely secondary to substance use, resolved #Anxiety/depression No concerns currently. -Continue Abilify 15 mg daily -Continue Klonopin 0.5 mg twice daily -Continue Cymbalta 60 mg daily -Decrease gabapentin to 200 mg 3 times daily given decreased renal function -Psychiatry following -Haldol and Ativan as needed for agitation  #GERD Chronic -Continue pantoprazole 40 mg daily  Diet: 2 gram sodium diet  IVF: None  VTE: None Code: Full PT/OT recs: SNF for Subacute PT  Dispo: Anticipated discharge to  Home in 3 days pending clinical improvement.   [facility] Resident PGY-1 [phone] Please contact the on call  pager after 5 pm and on weekends at [phone].

## 2022-09-08 ENCOUNTER — Inpatient Hospital Stay (HOSPITAL_COMMUNITY): Payer: No Typology Code available for payment source

## 2022-09-08 ENCOUNTER — Encounter (HOSPITAL_COMMUNITY): Payer: Self-pay | Admitting: Internal Medicine

## 2022-09-08 DIAGNOSIS — K767 Hepatorenal syndrome: Secondary | ICD-10-CM | POA: Diagnosis not present

## 2022-09-08 DIAGNOSIS — I959 Hypotension, unspecified: Secondary | ICD-10-CM | POA: Diagnosis not present

## 2022-09-08 DIAGNOSIS — K7682 Hepatic encephalopathy: Secondary | ICD-10-CM

## 2022-09-08 DIAGNOSIS — L03115 Cellulitis of right lower limb: Secondary | ICD-10-CM | POA: Diagnosis not present

## 2022-09-08 DIAGNOSIS — K7031 Alcoholic cirrhosis of liver with ascites: Secondary | ICD-10-CM | POA: Diagnosis not present

## 2022-09-08 LAB — CBC WITH DIFFERENTIAL/PLATELET
Abs Immature Granulocytes: 0.02 10*3/uL (ref 0.00–0.07)
Basophils Absolute: 0 10*3/uL (ref 0.0–0.1)
Basophils Relative: 0 %
Eosinophils Absolute: 0.4 10*3/uL (ref 0.0–0.5)
Eosinophils Relative: 7 %
HCT: 36.1 % (ref 36.0–46.0)
Hemoglobin: 12.1 g/dL (ref 12.0–15.0)
Immature Granulocytes: 0 %
Lymphocytes Relative: 20 %
Lymphs Abs: 1.1 10*3/uL (ref 0.7–4.0)
MCH: 30.1 pg (ref 26.0–34.0)
MCHC: 33.5 g/dL (ref 30.0–36.0)
MCV: 89.8 fL (ref 80.0–100.0)
Monocytes Absolute: 0.6 10*3/uL (ref 0.1–1.0)
Monocytes Relative: 12 %
Neutro Abs: 3.3 10*3/uL (ref 1.7–7.7)
Neutrophils Relative %: 61 %
Platelets: 33 10*3/uL — ABNORMAL LOW (ref 150–400)
RBC: 4.02 MIL/uL (ref 3.87–5.11)
RDW: 17.8 % — ABNORMAL HIGH (ref 11.5–15.5)
WBC: 5.4 10*3/uL (ref 4.0–10.5)
nRBC: 0 % (ref 0.0–0.2)

## 2022-09-08 LAB — PROTIME-INR
INR: 1.5 — ABNORMAL HIGH (ref 0.8–1.2)
Prothrombin Time: 17.7 seconds — ABNORMAL HIGH (ref 11.4–15.2)

## 2022-09-08 LAB — BASIC METABOLIC PANEL
Anion gap: 15 (ref 5–15)
BUN: 11 mg/dL (ref 6–20)
CO2: 17 mmol/L — ABNORMAL LOW (ref 22–32)
Calcium: 8.5 mg/dL — ABNORMAL LOW (ref 8.9–10.3)
Chloride: 101 mmol/L (ref 98–111)
Creatinine, Ser: 3.11 mg/dL — ABNORMAL HIGH (ref 0.44–1.00)
GFR, Estimated: 18 mL/min — ABNORMAL LOW (ref >60)
Glucose, Bld: 95 mg/dL (ref 70–99)
Potassium: 3.9 mmol/L (ref 3.5–5.1)
Sodium: 133 mmol/L — ABNORMAL LOW (ref 135–145)

## 2022-09-08 NOTE — Progress Notes (Signed)
[facility] Progress Note  Subjective: pt seen in room. Creat up to 3.11.   Vitals:   09/07/22 2054 09/08/22 0409 09/08/22 0500 09/08/22 0834  BP: 112/74 (!) 92/54  (!) 113/90  Pulse: 62 78  76  Resp: '17 17  17  '$ Temp: 97.6 F (36.4 C) 97.8 F (36.6 C)  97.9 F (36.6 C)  TempSrc: Oral Oral  Oral  SpO2: 97% 91%  97%  Weight:   67 kg   Height:        Exam: Gen alert, no distress, chronically ill appearing No jvd or bruits Chest clear bilat to bases RRR no MRG Abd soft ntnd no mass or ascites +bs MS R BKA intact Ext 2+ diffuse bilat LE edema Neuro is alert, Ox 3 , nf, mild asterixis      Home meds include - abilify, cymbalta, prozac, lasix 40 qd, lactulose, keppra, nadolol, PPI, aldactone 100 qd, vits/ supps    I/O total = 4.6 L in and no UOP recorded = +4.6 L   RA 93%, 88- 96/ 55- 65 BP's  RR 18  HR 65     UA pend      UNa, UCr pending     Renal US - IMPRESSION: Unremarkable examination of the kidneys and bladder.  Ascites noted.     Assessment/ Plan: AKI - b/l creat on admission was 0.7 on 12/25. Creat rising now up to 2.5 in the setting of hypotension and decompensated cirrhosis. Suspect hepatorenal syndrome. Did get some IV vanc and one dose po nsaids, all now dc'd. Also nadolol and diuretics were appropriately stopped when creatinine bumped up initially. Pt was started by primary team on midodrine (now at 15 mg tid) and IV albumin. We added octreotide as another tx for HRS. Creat is up again today at 3.1, UOP is low and prognosis is poor. No new suggestions.  Decompensated liver cirrhosis Hypotension - typical of cirrhotic physiology ETOH abuse disorder Hepatic encephalopathy EOL - prognosis is poor, recommend palliative consultation. I talked to her briefly about the seriousness of this situation only in response to her requests to be dc'd home. Not sure she can understand the magnitude of what is going on.        Rob Clayten Allcock 09/08/2022, 12:08  PM   Recent Labs  Lab 09/05/22 0112 09/06/22 0643 09/07/22 0221 09/08/22 0155  HGB 12.3   < > 11.2* 12.1  ALBUMIN 1.9*  --  2.6*  --   CALCIUM 7.9*   < > 8.2* 8.5*  CREATININE 1.09*   < > 2.58* 3.11*  K 4.2   < > 3.6 3.9   < > = values in this interval not displayed.   No results for input(s): "IRON", "TIBC", "FERRITIN" in the last 168 hours. Inpatient medications:  ARIPiprazole  15 mg Oral Daily   clonazePAM  0.5 mg Oral BID   doxycycline  100 mg Oral Q12H   DULoxetine  60 mg Oral QHS   folic acid  1 mg Oral Daily   lactulose  10 g Oral TID   midodrine  15 mg Oral TID WC   multivitamin with minerals  1 tablet Oral Daily   octreotide  150 mcg Subcutaneous Q12H   pantoprazole  40 mg Oral Daily   rifaximin  550 mg Oral BID   thiamine  100 mg Oral Daily    haloperidol lactate, mouth rinse

## 2022-09-08 NOTE — Progress Notes (Signed)
HD#6 Subjective:   Summary: This is a 51 year old female with a past medical history of alcohol use disorder, polysubstance use disorder, cirrhosis, hepatitis C, GERD, and seizures who presented to the emergency room with concerns of bilateral lower extremity pain and erythema.  MRI showed evidence of right lower extremity osteomyelitis and myositis, and patient admitted for further evaluation and management.  Throughout hospitalization, cirrhosis worsened, and led to worsening kidney injury.   Overnight Events: No overnight events   Patient evaluated at bedside this AM. Patient in the recliner upon my exam. She reports that she is feeling okay and is reporting that her leg pain is good. She does not recall much of the conversation about her prognosis yesterday and did not remember about palliative care. Patient states that she would like to go home. She states that she will stop drinking and go to the doctor and that will help her. Patient denies any nausea, vomiting, tremors, or sweats.   Objective:  Vital signs in last 24 hours: Vitals:   09/07/22 1640 09/07/22 2054 09/08/22 0409 09/08/22 0500  BP: 122/88 112/74 (!) 92/54   Pulse: 66 62 78   Resp: '18 17 17   '$ Temp: 98.4 F (36.9 C) 97.6 F (36.4 C) 97.8 F (36.6 C)   TempSrc:  Oral Oral   SpO2: 93% 97% 91%   Weight:    67 kg  Height:       Supplemental O2: Room Air SpO2: 91 %   Physical Exam:  Constitutional: Chronically ill appearing resting in recliner in no acute distress  HENT: Nonerythematous conjunctiva Cardiovascular: regular rate and rhythm, no m/r/g Pulmonary/Chest: Crackles heard throughout  Abdomen: Hyperactive bowel sounds, with abdominal distention noted with positive fluid wave  Neuro: Alert and oriented x3 Extremities: Bilateral lower extremities with 2+ pitting edema noted.  Open wound noted to right lower stump with no signs of drainage or bleeding.  Filed Weights   09/02/22 1327 09/03/22 0251 09/08/22  0500  Weight: 60.8 kg 62.9 kg 67 kg     Intake/Output Summary (Last 24 hours) at 09/08/2022 0659 Last data filed at 09/07/2022 1700 Gross per 24 hour  Intake 520 ml  Output --  Net 520 ml    Net IO Since Admission: 4,565.12 mL [09/08/22 0659]  Pertinent Labs:    Latest Ref Rng & Units 09/08/2022    1:55 AM 09/07/2022    2:21 AM 09/06/2022    6:43 AM  CBC  WBC 4.0 - 10.5 K/uL 5.4  5.3  8.2   Hemoglobin 12.0 - 15.0 g/dL 12.1  11.2  12.8   Hematocrit 36.0 - 46.0 % 36.1  33.5  37.9   Platelets 150 - 400 K/uL 33  38  50        Latest Ref Rng & Units 09/08/2022    1:55 AM 09/07/2022    2:21 AM 09/06/2022    6:43 AM  CMP  Glucose 70 - 99 mg/dL 95  118  93   BUN 6 - 20 mg/dL '11  7  5   '$ Creatinine 0.44 - 1.00 mg/dL 3.11  2.58  1.94   Sodium 135 - 145 mmol/L 133  135  135   Potassium 3.5 - 5.1 mmol/L 3.9  3.6  3.8   Chloride 98 - 111 mmol/L 101  101  97   CO2 22 - 32 mmol/L '17  24  26   '$ Calcium 8.9 - 10.3 mg/dL 8.5  8.2  8.2  Total Protein 6.5 - 8.1 g/dL  5.2    Total Bilirubin 0.3 - 1.2 mg/dL  1.3    Alkaline Phos 38 - 126 U/L  110    AST 15 - 41 U/L  43    ALT 0 - 44 U/L  16      Imaging: No results found.  Assessment/Plan:   Principal Problem:   Cellulitis of right lower extremity Active Problems:   AKI (acute kidney injury) ([place])   Decompensated hepatic cirrhosis ([place])   Delirium due to another medical condition   Osteomyelitis of right tibia [facility])   Patient Summary: Briana Sweeney is a 51 y.o. female with a past medical history of alcohol use disorder, polysubstance use disorder, cirrhosis, hepatitis C, GERD, and seizures who presented to the emergency room with concerns of bilateral lower extremity pain and erythema.  MRI showed evidence of right lower extremity osteomyelitis and myositis, and patient admitted for further evaluation and management.  Throughout hospitalization, cirrhosis worsened, and led to worsening kidney injury.  #Decompensated  liver cirrhosis #Hepatorenal syndrome  #Hypotension #Hepatic encephalopathy #Alcohol use disorder Patient evaluated bedside this morning.  Patient reports she is feeling okay. She notes having 2 bowel movements yesterday. She reports that she is having some trouble walking but does not have any other concerns at this time.  On exam this morning, patient with noticeable abdominal distension. No asterixis noted on my exam. There is a positive fluid wave in her abdomen. There are also noted crackles noted to the patient lung bases. Patient is alert and oriented x 3. She seemed to be unclear about the prognosis conversation yesterday and does not recall much of the information from the conversation. Patient kidney function continues to decline with creatinine increased this morning.  Patient seen by nephrology yesterday, and they had concerns for hepatorenal syndrome as well.  Given yesterday was the 2nd day of albumin with no improvement in creatinine, diagnosis hepatorenal syndrome is likely.  Patient started on octreotide, and Foley placed as well.  Patient's urine output has been minimal, with only 100 mL overnight.  Blood pressure remained decreased, with MAPs in the 60s.  During the day yesterday, patient did have increased MAPs, but did fall later in the day.  Patient's current MELD score 23, child Pugh class C.  Patient fibrinogen level did come back slightly decreased, but concern for DIC is low given there is no concern for bleeding.  Hemoglobin within normal limits. Platelets are decreased.  Explained to the patient the severity of her disease, and that prognosis is poor.  Unclear if patient fully understands her prognosis, stating that she would like to go home and is eat healthy, stop alcohol use, go to her doctor.  Patient to speak with palliative care today.  Goals of care decision will need to be talked about for example, placing a Pleurx catheter, CODE STATUS, and quality of life. -Urine studies  pending, UA pending -Renal ultrasound showed no evidence of stones or hydronephrosis -Continue to hold spironolactone, Lasix, nadolol -Titrate lactulose to goal of 3 bowel movements per day -Continue rifaximin -Continue octreotide -Continue thiamine supplementation -Avoid nephrotoxic agents -Strict I's and O's -Nephrology following -Palliative care following -Monitor blood pressure closely -Consult PCCM if MAPs remain less than 60  #Right lower extremity osteomyelitis #Right posterior calf myositis #Left lower extremity cellulitis Patient evaluated this morning.  Patient states that her right lower extremity feels no pain today. She states that her pain is well managed today.  Today is day 3 of doxycycline, total day 7 of antibiotics.  On exam, Patient would looks stable with no concerns for new bleeding or drainage. There is 2+ pitting edema noted to bilateral lower extremities. Ceftriaxone has been finished.  Patient follow-up with Dr. Due to on 09/10/2022. -Ortho following, outpatient follow up on 09/10/2022 -Day 3 doxycycline, total day 7 antibiotics -Monitor fever curve -Stump Shrinker and compression stockings  #Thrombocytopenia Platelet count down to 33,000.  No evidence of bleeding at this time. Smear with no evidence of morphology changes or evidence of hemolysis. Fibrinogen level did come back slightly decreased, but no evidence of bleeding on exam.  Continue to hold Lovenox.  -Hold Lovenox until platelets up to 50k -Monitor CBC  #Acute psychosis likely secondary to substance use, resolved #Anxiety/depression No concerns currently. -Continue Abilify 15 mg daily -Continue Klonopin 0.5 mg twice daily -Continue Cymbalta 60 mg daily -Decrease gabapentin to 200 mg 3 times daily given decreased renal function -Psychiatry following -Haldol and Ativan as needed for agitation  #GERD Chronic -Continue pantoprazole 40 mg daily  Diet: 2 gram sodium diet  IVF: None  VTE:  None Code: Full PT/OT recs: SNF for Subacute PT  Dispo: Anticipated discharge to  Home in 3 days pending clinical improvement.   [facility] Resident PGY-1 [phone] Please contact the on call pager after 5 pm and on weekends at [phone].

## 2022-09-08 NOTE — Progress Notes (Signed)
Pt is alert to person, place and year.  She has been attempting to make phone calls since this morning.  The CNA and RN have both dialed for the patient.  She spoke to her friend on the phone after the number was dialed for her.  Her apartment complex had a recording and was closed today.  She has rang out at least every hour on the call well to request the same numbers be dialed.  Pt has been confused to situation today.

## 2022-09-08 NOTE — Plan of Care (Signed)
  Problem: Clinical Measurements: Goal: Will remain free from infection Outcome: Progressing   Problem: Activity: Goal: Risk for activity intolerance will decrease Outcome: Progressing   

## 2022-09-08 NOTE — Progress Notes (Signed)
Mobility Specialist Progress Note   09/08/22 1637  Mobility  Activity Transferred from chair to bed  Level of Assistance Minimal assist, patient does 75% or more  Assistive Device Front wheel walker  Activity Response Tolerated well  $Mobility charge 1 Mobility   Pt confused upon entry but requesting to get back to bed. MinA to stand from chair d/t slight unsteadiness but mod cues throughout for directional cues and foot placement. Pt fatiguing in LLE fairly quickly and sitting down prematurely w/o communication. No incidents on transfer, call bell in reach and bed alarm on.   [place] Falling Mobility Specialist Please contact via [place] or  Rehab office at [phone]

## 2022-09-09 DIAGNOSIS — K767 Hepatorenal syndrome: Secondary | ICD-10-CM | POA: Diagnosis not present

## 2022-09-09 DIAGNOSIS — I959 Hypotension, unspecified: Secondary | ICD-10-CM | POA: Diagnosis not present

## 2022-09-09 DIAGNOSIS — K7031 Alcoholic cirrhosis of liver with ascites: Secondary | ICD-10-CM | POA: Diagnosis not present

## 2022-09-09 DIAGNOSIS — L03115 Cellulitis of right lower limb: Secondary | ICD-10-CM | POA: Diagnosis not present

## 2022-09-09 LAB — CBC
HCT: 34.7 % — ABNORMAL LOW (ref 36.0–46.0)
Hemoglobin: 11.3 g/dL — ABNORMAL LOW (ref 12.0–15.0)
MCH: 29.4 pg (ref 26.0–34.0)
MCHC: 32.6 g/dL (ref 30.0–36.0)
MCV: 90.1 fL (ref 80.0–100.0)
Platelets: 47 10*3/uL — ABNORMAL LOW (ref 150–400)
RBC: 3.85 MIL/uL — ABNORMAL LOW (ref 3.87–5.11)
RDW: 17.9 % — ABNORMAL HIGH (ref 11.5–15.5)
WBC: 6.8 10*3/uL (ref 4.0–10.5)
nRBC: 0 % (ref 0.0–0.2)

## 2022-09-09 LAB — COMPREHENSIVE METABOLIC PANEL
ALT: 11 U/L (ref 0–44)
AST: 28 U/L (ref 15–41)
Albumin: 2.6 g/dL — ABNORMAL LOW (ref 3.5–5.0)
Alkaline Phosphatase: 96 U/L (ref 38–126)
Anion gap: 10 (ref 5–15)
BUN: 14 mg/dL (ref 6–20)
CO2: 25 mmol/L (ref 22–32)
Calcium: 8.5 mg/dL — ABNORMAL LOW (ref 8.9–10.3)
Chloride: 96 mmol/L — ABNORMAL LOW (ref 98–111)
Creatinine, Ser: 3.87 mg/dL — ABNORMAL HIGH (ref 0.44–1.00)
GFR, Estimated: 14 mL/min — ABNORMAL LOW (ref >60)
Glucose, Bld: 111 mg/dL — ABNORMAL HIGH (ref 70–99)
Potassium: 4.2 mmol/L (ref 3.5–5.1)
Sodium: 131 mmol/L — ABNORMAL LOW (ref 135–145)
Total Bilirubin: 1.7 mg/dL — ABNORMAL HIGH (ref 0.3–1.2)
Total Protein: 5 g/dL — ABNORMAL LOW (ref 6.5–8.1)

## 2022-09-09 LAB — CULTURE, BODY FLUID W GRAM STAIN -BOTTLE: Culture: NO GROWTH

## 2022-09-09 MED ORDER — MORPHINE SULFATE (PF) 2 MG/ML IV SOLN
2.0000 mg | INTRAVENOUS | Status: DC | PRN
  Administered 2022-09-09 (×2): 2 mg via INTRAVENOUS
  Filled 2022-09-09 (×2): qty 1

## 2022-09-09 MED ORDER — CHLORHEXIDINE GLUCONATE CLOTH 2 % EX PADS
6.0000 | MEDICATED_PAD | Freq: Every day | CUTANEOUS | Status: DC
  Administered 2022-09-09 – 2022-09-11 (×3): 6 via TOPICAL

## 2022-09-09 NOTE — Consult Note (Signed)
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  Wound type:infectious Pressure Injury POA: NA Measurement: 0.3 cm open wound along healed staple line Right BKA.  Left anterior lower leg wound with surrounding cellulitis  Wound bed: Red and tender Drainage (amount, consistency, odor) minimal serosanguinous  no odor Periwound:edema, erythema and tender to touch Dressing procedure/placement/frequency: Dr Sharol Given has consulted and recommended stump shrinker to right leg and compression garment to left leg.  May add small piece aquacel ([employer] # [id number]) to open wounds and cover with silicone foam before adding garments.  Will not follow at this time.  Please re-consult if needed.  Estrellita Ludwig MSN, RN, FNP-BC CWON Wound, Ostomy, Continence Nurse [facility] [phone] Pager [phone]

## 2022-09-09 NOTE — Progress Notes (Signed)
HD#7 Subjective:   Summary: This is a 52 year old female with a past medical history of alcohol use disorder, polysubstance use disorder, cirrhosis, hepatitis C, GERD, and seizures who presented to the emergency room with concerns of bilateral lower extremity pain and erythema.  MRI showed evidence of right lower extremity osteomyelitis and myositis, and patient admitted for further evaluation and management.  Throughout hospitalization, cirrhosis worsened, and led to worsening kidney injury.   Overnight Events: No overnight events   Patient evaluated at bedside this AM. Patient reports that she is doing better. She has no concern this morning, she notes that she is having a great appetite and denies any pain in her legs. She denies any bleeding or any drainage from her right leg. She notes that she would like to go home and what happens after that, happens. She seems to be more understanding about what is going on.   Objective:  Vital signs in last 24 hours: Vitals:   09/09/22 0425 09/09/22 0500 09/09/22 0518 09/09/22 1003  BP: (!) 85/52  (!) 90/57 (!) 88/56  Pulse: 72  (!) 59 75  Resp:   18 17  Temp: 98.4 F (36.9 C)     TempSrc: Oral   Oral  SpO2: 97%  96% 95%  Weight:  68.4 kg    Height:       Supplemental O2: Room Air SpO2: 95 %   Physical Exam:  Constitutional: Chronically ill appearing resting in bed in no acute distress, slightly tearful on exam HENT: Nonerythematous conjunctiva Cardiovascular: regular rate and rhythm, no m/r/g Pulmonary/Chest: Crackles heard to bilateral lung fields Abdomen: Soft, hypoactive bowels abdominal distention noted Neuro: Patient alert and oriented x 3 Extremities: Right lower extremity stent with bandage applied to wound.  No signs of bleeding or drainage.  2+ pitting edema to bilateral lower extremities.  Filed Weights   09/03/22 0251 09/08/22 0500 09/09/22 0500  Weight: 62.9 kg 67 kg 68.4 kg     Intake/Output Summary (Last 24  hours) at 09/09/2022 1150 Last data filed at 09/09/2022 1003 Gross per 24 hour  Intake 240 ml  Output 250 ml  Net -10 ml   Net IO Since Admission: 4,795.12 mL [09/09/22 1150]  Pertinent Labs:    Latest Ref Rng & Units 09/09/2022    2:29 AM 09/08/2022    1:55 AM 09/07/2022    2:21 AM  CBC  WBC 4.0 - 10.5 K/uL 6.8  5.4  5.3   Hemoglobin 12.0 - 15.0 g/dL 11.3  12.1  11.2   Hematocrit 36.0 - 46.0 % 34.7  36.1  33.5   Platelets 150 - 400 K/uL 47  33  38        Latest Ref Rng & Units 09/09/2022    2:29 AM 09/08/2022    1:55 AM 09/07/2022    2:21 AM  CMP  Glucose 70 - 99 mg/dL 111  95  118   BUN 6 - 20 mg/dL '14  11  7   '$ Creatinine 0.44 - 1.00 mg/dL 3.87  3.11  2.58   Sodium 135 - 145 mmol/L 131  133  135   Potassium 3.5 - 5.1 mmol/L 4.2  3.9  3.6   Chloride 98 - 111 mmol/L 96  101  101   CO2 22 - 32 mmol/L '25  17  24   '$ Calcium 8.9 - 10.3 mg/dL 8.5  8.5  8.2   Total Protein 6.5 - 8.1 g/dL 5.0   5.2  Total Bilirubin 0.3 - 1.2 mg/dL 1.7   1.3   Alkaline Phos 38 - 126 U/L 96   110   AST 15 - 41 U/L 28   43   ALT 0 - 44 U/L 11   16     Imaging: No results found.  Assessment/Plan:   Principal Problem:   Cellulitis of right lower extremity Active Problems:   AKI (acute kidney injury) ([place])   Decompensated hepatic cirrhosis ([place])   Delirium due to another medical condition   Osteomyelitis of right tibia [facility])   Patient Summary: Briana Sweeney is a 52 y.o. female with a past medical history of alcohol use disorder, polysubstance use disorder, cirrhosis, hepatitis C, GERD, and seizures who presented to the emergency room with concerns of bilateral lower extremity pain and erythema.  MRI showed evidence of right lower extremity osteomyelitis and myositis, and patient admitted for further evaluation and management.  Throughout hospitalization, cirrhosis worsened, and led to worsening kidney injury.  #Decompensated liver cirrhosis #Hepatorenal syndrome  #Hypotension #Hepatic  encephalopathy #Alcohol use disorder Patient evaluated bedside this morning.  Patient states she is doing well.  She has no concerns this morning.  She reports having a good appetite.  Patient denies any other concerns this morning.  On exam, there is noticeable abdominal distention, and there is noticeable crackles in her lungs.  Patient does seem slightly confused, but is alert and oriented x 2.  Patient is in the function continues to decline with creatinine elevated this morning higher than yesterday.  Patient remains on octreotide, with blood pressures remain in and the low 90s.  Yesterday, blood pressures did come up into the 110s and 120s, but now back down to low 90s.  Patient's MELD score 29 today.  Prognosis is poor, and had a lengthy discussion with patient today about prognosis.  Patient seems to be more understanding about the magnitude of the disease and states that she would like to be home.  She states that being with her daughter would be important while her time is limited.  She states that if she were to decline she would not like to come back to the hospital.  Renal ultrasound did not show anything revealing.  For now, we will continue with aggressive treatment, until patient has formal consult with palliative care to discuss exactly what she wants moving forward.  Nephrology has not had any more input except for continuing what we are doing.  Goals of care decisions need to be made such as CODE STATUS, potentially placing a Pleurx catheter, future paracenteses, and other quality of life decisions.  Will await palliative care consult.   -Continue rifaximin -Continue octreotide -Continue lactulose, titrate to goal of 3 bowel movements per day -Continue to hold spironolactone, Lasix, nadolol -Strict I's and O's and daily weights -Nephrology following -Continue to monitor blood pressure closely -If MAPs become less than 60 consistently, will try 25 g to 50 g of albumin to bring up blood  pressure, and if fails, would consult PCCM   #Right lower extremity osteomyelitis #Right posterior calf myositis #Left lower extremity cellulitis Patient with no concerns regarding her lower extremities today.  Patient denies any pain.  Patient denies any bleeding or drainage.  Patient does have 2+ pitting edema noted to bilateral lower extremities.  Wound is stable.  Patient is on day 4 of doxycycline, total day 8 of antibiotics. -Ortho following, will message Dr. Due to to push back follow-up outpatient -Monitor fever curve -Stump  shrinker and compression stockings  #Thrombocytopenia Platelet count decreased at 47,000 today.  No evidence of bleeding noted.  Of note, patient was taking Nplate in the outpatient setting.  Will consider restarting during hospitalization. -Continue to hold Lovenox -Monitor CBC  #Acute psychosis likely secondary to substance use, resolved #Anxiety/depression No concerns currently. -Continue Abilify 15 mg daily -Continue Klonopin 0.5 mg twice daily -Continue Cymbalta 60 mg daily -Removed Gabapentin yesterday  -Psychiatry following -Haldol and Ativan as needed for agitation  #GERD Chronic -Continue pantoprazole 40 mg daily  Of note, will speak to sister and pastor about patient's prognosis, as patient wishes to do so.  In the coming days, will need to have patient make a decision on what to do moving forward, as comfort care was introduced to patient today.  Patient prognosis is poor, and I am currently worried that her kidney function will not recover despite appropriate medical treatment.   Diet: 2 gram sodium diet  IVF: None  VTE: None Code: Full PT/OT recs: SNF for Subacute PT  Dispo: Anticipated discharge to Home in 3 days pending clinical improvement.   [facility] Resident PGY-1 [phone] Please contact the on call pager after 5 pm and on weekends at [phone].

## 2022-09-09 NOTE — Progress Notes (Signed)
[name]  Assessment/ Plan:  # AKI - b/l creat on admission was 0.7 on 12/25.  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  Kidney ultrasound unremarkable.  Treating with albumin, midodrine and octreotide without much improvement.  Patient is now oliguric.  She has a poor prognosis and currently DNR.  Noted discussion about comfort care ongoing with the primary team.  Continue current conservative management.  #Decompensated liver cirrhosis: Per primary team.  #Hypotension -on midodrine and albumin.  # Hyponatremia, hypervolemic due to liver cirrhosis physiology.  Continue fluid restriction.  Can try loop diuretics.  #ETOH abuse disorder #Hepatic encephalopathy  #EOL - prognosis is poor, recommend palliative consultation.  She is currently DNR.  I discussed about poor prognosis with the patient as well.  Really nothing further to add except as outlined above.  Subjective: Seen and examined at the bedside.  Chaplain was present.  Patient was very tearful.  Urine output is recorded around 250 cc.  She denies nausea, vomiting but very emotional. Objective Vital signs in last 24 hours: Vitals:   09/09/22 0425 09/09/22 0500 09/09/22 0518 09/09/22 1003  BP: (!) 85/52  (!) 90/57 (!) 88/56  Pulse: 72  (!) 59 75  Resp:   18 17  Temp: 98.4 F (36.9 C)     TempSrc: Oral   Oral  SpO2: 97%  96% 95%  Weight:  68.4 kg    Height:       Weight change: 1.4 kg  Intake/Output Summary (Last 24 hours) at 09/09/2022 1305 Last data filed at 09/09/2022 1003 Gross per 24 hour  Intake 240 ml  Output 250 ml  Net -10 ml       Labs: RENAL PANEL Recent Labs    01/22/22 2021 01/23/22 0000 01/23/22 0508 01/24/22 0511 03/01/22 0831 03/02/22 0319 03/03/22 0602 03/11/22 0459 03/12/22 0315 05/06/22 0000 05/13/22 1941 05/14/22 0424 05/15/22 0443 06/29/22 1540 07/03/22 0010 07/04/22 0449 07/05/22 5956 07/06/22 0208 07/08/22 0419 07/22/22 0404  08/08/22 1328 09/02/22 1430 09/03/22 0328 09/04/22 0313 09/05/22 0112 09/06/22 0643 09/07/22 0221 09/08/22 0155 09/09/22 0229  NA  --  135 135   < >  --  131*   < > 139 136   < > 135   < > 131*   < > 134* 134* 135 132*   < > 130* 135 136 135 135 133* 135 135 133* 131*  K  --  3.3* 3.5   < >  --  3.5   < > 3.8 4.0   < > 3.8   < > 3.9   < > 4.7 4.7 3.9 3.9   < > 3.6 3.0* 3.1* 4.1 3.7 4.2 3.8 3.6 3.9 4.2  CL  --  100 100   < >  --  101   < > 109 104   < > 101   < > 102   < > 100 99 97* 94*   < > 97* 99 96* 97* 100 100 97* 101 101 96*  CO2  --  24 24   < >  --  23   < > 23 25   < > 24   < > 21*   < > '26 27 30 29   '$ < > '26 24 26 24 27 22 26 24 '$ 17* 25  GLUCOSE  --  131* 92   < >  --  132*   < > 91 104*  --  104*   < >  94   < > 188* 109* 86 104*   < > 92 83 60* 72 88 111* 93 118* 95 111*  BUN  --  <5* <5*   < >  --  5*   < > 6 8   < > 6   < > 8   < > '13 16 15 13   '$ < > 5* 6 <5* <5* <5* <5* 5* '7 11 14  '$ CREATININE  --  0.51 0.53   < >  --  0.55   < > 0.44 0.53   < > 0.63   < > 0.56   < > 0.59 0.48 0.64 0.55   < > 0.52 0.55 0.59 0.71 0.70 1.09* 1.94* 2.58* 3.11* 3.87*  CALCIUM  --  7.5* 7.5*   < >  --  8.2*   < > 7.6* 8.4*   < > 8.0*   < > 7.7*   < > 8.6* 8.8* 9.3 8.3*   < > 7.7* 8.1* 8.1* 8.1* 8.0* 7.9* 8.2* 8.2* 8.5* 8.5*  MG  --  1.9 1.8   < > 1.7 1.7  --  1.9 1.7  --  1.5*  --  1.8  --   --   --   --  1.5*  --   --   --  1.3* 2.1 1.6*  --   --   --   --   --   PHOS 2.4* 1.8* 2.0*  --   --   --   --  3.1  --   --   --   --  2.5  --   --   --   --  3.9  --   --   --   --   --   --   --   --   --   --   --   ALBUMIN  --   --  2.4*   < >  --  2.3*   < > 2.5* 2.7*   < > 2.3*   < > 2.1*   < > 1.6* 1.8* 1.9* 1.8*  --   --  2.2* 2.2* 2.1*  --  1.9*  --  2.6*  --  2.6*   < > = values in this interval not displayed.     Liver Function Tests: Recent Labs  Lab 09/03/22 0328 09/05/22 0112 09/07/22 0221 09/09/22 0229  AST 50*  --  43* 28  ALT 20  --  16 11  ALKPHOS 139*  --  110 96  BILITOT 4.2*  --   1.3* 1.7*  PROT 5.9*  --  5.2* 5.0*  ALBUMIN 2.1* 1.9* 2.6* 2.6*   No results for input(s): "LIPASE", "AMYLASE" in the last 168 hours. Recent Labs  Lab 09/02/22 1540  AMMONIA 32   CBC: Recent Labs    12/18/21 1533 12/24/21 0000 01/22/22 2021 01/22/22 2025 01/23/22 0508 02/18/22 1420 02/25/22 0000 05/20/22 1417 05/27/22 0000 09/04/22 0313 09/05/22 0112 09/06/22 8921 09/07/22 0221 09/08/22 0155 09/09/22 0229  HGB  --    < >  --   --    < >  --    < >  --    < > 12.9 12.3 12.8 11.2* 12.1 11.3*  MCV  --    < >  --   --    < >  --    < >  --    < > 87.6 93.3 88.8 89.1  89.8 90.1  VITAMINB12  --   --  1,073*  --   --   --   --   --   --  2,150*  --   --   --   --   --   FOLATE  --   --  14.2  --   --   --   --   --   --  26.2  --   --   --   --   --   FERRITIN 22  --  23  --   --  12  --  23  --   --   --   --   --   --   --   TIBC 346  --  308  --   --  417  --  412  --   --   --   --   --   --   --   IRON 29  --  41  --   --  27*  --  18*  --   --   --   --   --   --   --   RETICCTPCT  --   --   --  2.1  --   --   --   --   --   --   --   --   --   --   --    < > = values in this interval not displayed.    Cardiac Enzymes: No results for input(s): "CKTOTAL", "CKMB", "CKMBINDEX", "TROPONINI" in the last 168 hours. CBG: No results for input(s): "GLUCAP" in the last 168 hours.  Iron Studies: No results for input(s): "IRON", "TIBC", "TRANSFERRIN", "FERRITIN" in the last 72 hours. Studies/Results: US RENAL  Result Date: 09/08/2022 CLINICAL DATA:  [id number] AKI (acute kidney injury) [facility]) [id number] Hepatorenal syndrome ([place]) 6152 EXAM: RENAL / URINARY TRACT ULTRASOUND COMPLETE COMPARISON:  None Available. FINDINGS: The right kidney measured 9 2 cm and the left kidney measured 10.6 cm. The kidneys demonstrate normal echogenicity. No renal parenchymal lesions are identified. No shadowing stones are seen. No hydronephrosis. The urinary bladder appeared unremarkable. Moderate amount  of ascites noted in the upper abdomen and pelvis. IMPRESSION: Unremarkable examination of the kidneys and bladder.  Ascites noted. Electronically Signed   By: Sammie Bench M.D.   On: 09/08/2022 11:17    Medications: Infusions:   Scheduled Medications:  ARIPiprazole  15 mg Oral Daily   Chlorhexidine Gluconate Cloth  6 each Topical Daily   clonazePAM  0.5 mg Oral BID   doxycycline  100 mg Oral Q12H   DULoxetine  60 mg Oral QHS   lactulose  10 g Oral TID   midodrine  15 mg Oral TID WC   octreotide  150 mcg Subcutaneous Q12H   pantoprazole  40 mg Oral Daily   rifaximin  550 mg Oral BID    have reviewed scheduled and prn medications.  Physical Exam: General: Ill looking female, lying on bed Heart:RRR, s1s2 nl Lungs:clear b/l, no crackle Abdomen:soft, Non-tender, distention present Extremities: Peripheral edema present Neurology: Alert, awake and following commands  Briana Sweeney Briana Sweeney 09/09/2022,1:05 PM  LOS: 7 days

## 2022-09-10 ENCOUNTER — Ambulatory Visit: Payer: No Typology Code available for payment source | Admitting: Orthopedic Surgery

## 2022-09-10 DIAGNOSIS — L03115 Cellulitis of right lower limb: Secondary | ICD-10-CM | POA: Diagnosis not present

## 2022-09-10 DIAGNOSIS — K767 Hepatorenal syndrome: Secondary | ICD-10-CM | POA: Diagnosis not present

## 2022-09-10 DIAGNOSIS — K7290 Hepatic failure, unspecified without coma: Secondary | ICD-10-CM | POA: Diagnosis not present

## 2022-09-10 DIAGNOSIS — Z7189 Other specified counseling: Secondary | ICD-10-CM | POA: Diagnosis not present

## 2022-09-10 DIAGNOSIS — M86261 Subacute osteomyelitis, right tibia and fibula: Secondary | ICD-10-CM | POA: Diagnosis not present

## 2022-09-10 LAB — CBC WITH DIFFERENTIAL/PLATELET
Abs Immature Granulocytes: 0.01 10*3/uL (ref 0.00–0.07)
Basophils Absolute: 0.1 10*3/uL (ref 0.0–0.1)
Basophils Relative: 1 %
Eosinophils Absolute: 0.5 10*3/uL (ref 0.0–0.5)
Eosinophils Relative: 8 %
HCT: 33.1 % — ABNORMAL LOW (ref 36.0–46.0)
Hemoglobin: 11.1 g/dL — ABNORMAL LOW (ref 12.0–15.0)
Immature Granulocytes: 0 %
Lymphocytes Relative: 22 %
Lymphs Abs: 1.5 10*3/uL (ref 0.7–4.0)
MCH: 29.8 pg (ref 26.0–34.0)
MCHC: 33.5 g/dL (ref 30.0–36.0)
MCV: 89 fL (ref 80.0–100.0)
Monocytes Absolute: 0.7 10*3/uL (ref 0.1–1.0)
Monocytes Relative: 11 %
Neutro Abs: 3.9 10*3/uL (ref 1.7–7.7)
Neutrophils Relative %: 58 %
Platelets: 52 10*3/uL — ABNORMAL LOW (ref 150–400)
RBC: 3.72 MIL/uL — ABNORMAL LOW (ref 3.87–5.11)
RDW: 18.1 % — ABNORMAL HIGH (ref 11.5–15.5)
WBC: 6.6 10*3/uL (ref 4.0–10.5)
nRBC: 0 % (ref 0.0–0.2)

## 2022-09-10 LAB — COMPREHENSIVE METABOLIC PANEL
ALT: 10 U/L (ref 0–44)
AST: 28 U/L (ref 15–41)
Albumin: 2.5 g/dL — ABNORMAL LOW (ref 3.5–5.0)
Alkaline Phosphatase: 98 U/L (ref 38–126)
Anion gap: 12 (ref 5–15)
BUN: 19 mg/dL (ref 6–20)
CO2: 24 mmol/L (ref 22–32)
Calcium: 8.5 mg/dL — ABNORMAL LOW (ref 8.9–10.3)
Chloride: 98 mmol/L (ref 98–111)
Creatinine, Ser: 4.58 mg/dL — ABNORMAL HIGH (ref 0.44–1.00)
GFR, Estimated: 11 mL/min — ABNORMAL LOW (ref >60)
Glucose, Bld: 96 mg/dL (ref 70–99)
Potassium: 4.4 mmol/L (ref 3.5–5.1)
Sodium: 134 mmol/L — ABNORMAL LOW (ref 135–145)
Total Bilirubin: 1.6 mg/dL — ABNORMAL HIGH (ref 0.3–1.2)
Total Protein: 4.8 g/dL — ABNORMAL LOW (ref 6.5–8.1)

## 2022-09-10 MED ORDER — HALOPERIDOL 0.5 MG PO TABS: 0.5000 mg | ORAL_TABLET | ORAL | Status: DC | PRN

## 2022-09-10 MED ORDER — FUROSEMIDE 10 MG/ML IJ SOLN
40.0000 mg | Freq: Once | INTRAMUSCULAR | Status: AC
  Administered 2022-09-10: 40 mg via INTRAVENOUS
  Filled 2022-09-10: qty 4

## 2022-09-10 MED ORDER — HALOPERIDOL LACTATE 2 MG/ML PO CONC: 0.5000 mg | ORAL | Status: DC | PRN

## 2022-09-10 MED ORDER — HALOPERIDOL LACTATE 5 MG/ML IJ SOLN: 0.5000 mg | INTRAMUSCULAR | Status: DC | PRN

## 2022-09-10 MED ORDER — ONDANSETRON 4 MG PO TBDP: 4.0000 mg | ORAL_TABLET | Freq: Four times a day (QID) | ORAL | Status: DC | PRN

## 2022-09-10 MED ORDER — POLYVINYL ALCOHOL 1.4 % OP SOLN: 1.0000 [drp] | Freq: Four times a day (QID) | OPHTHALMIC | Status: DC | PRN

## 2022-09-10 MED ORDER — LORAZEPAM 1 MG PO TABS: 1.0000 mg | ORAL_TABLET | ORAL | Status: DC | PRN

## 2022-09-10 MED ORDER — ACETAMINOPHEN 325 MG PO TABS: 650.0000 mg | ORAL_TABLET | Freq: Four times a day (QID) | ORAL | Status: DC | PRN

## 2022-09-10 MED ORDER — ACETAMINOPHEN 650 MG RE SUPP: 650.0000 mg | Freq: Four times a day (QID) | RECTAL | Status: DC | PRN

## 2022-09-10 MED ORDER — ALBUMIN HUMAN 25 % IV SOLN
50.0000 g | Freq: Once | INTRAVENOUS | Status: AC
  Administered 2022-09-10: 50 g via INTRAVENOUS
  Filled 2022-09-10: qty 200

## 2022-09-10 MED ORDER — HYDROMORPHONE HCL 2 MG PO TABS
2.0000 mg | ORAL_TABLET | Freq: Four times a day (QID) | ORAL | Status: DC
  Administered 2022-09-10 – 2022-09-11 (×5): 2 mg via ORAL
  Filled 2022-09-10 (×5): qty 1

## 2022-09-10 MED ORDER — GLYCOPYRROLATE 0.2 MG/ML IJ SOLN: 0.2000 mg | INTRAMUSCULAR | Status: DC | PRN

## 2022-09-10 MED ORDER — ONDANSETRON HCL 4 MG/2ML IJ SOLN: 4.0000 mg | Freq: Four times a day (QID) | INTRAMUSCULAR | Status: DC | PRN

## 2022-09-10 MED ORDER — GLYCOPYRROLATE 1 MG PO TABS: 1.0000 mg | ORAL_TABLET | ORAL | Status: DC | PRN

## 2022-09-10 MED ORDER — HYDROMORPHONE HCL 2 MG PO TABS
2.0000 mg | ORAL_TABLET | ORAL | Status: DC | PRN
  Administered 2022-09-11 (×2): 2 mg via ORAL
  Filled 2022-09-10 (×2): qty 1

## 2022-09-10 MED ORDER — HYDROMORPHONE HCL 1 MG/ML IJ SOLN: 0.5000 mg | INTRAMUSCULAR | Status: DC | PRN

## 2022-09-10 MED ORDER — HYDROMORPHONE HCL 2 MG PO TABS
1.0000 mg | ORAL_TABLET | ORAL | Status: DC | PRN
  Administered 2022-09-10: 1 mg via ORAL
  Filled 2022-09-10: qty 1

## 2022-09-10 MED ORDER — LORAZEPAM 2 MG/ML PO CONC: 1.0000 mg | ORAL | Status: DC | PRN

## 2022-09-10 NOTE — Progress Notes (Signed)
[facility] NEPHROLOGY PROGRESS NOTE  Assessment/ Plan:  # AKI - b/l creat on admission was 0.7 on 12/25.  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  Kidney ultrasound unremarkable.  Treating with albumin, midodrine and octreotide without much improvement.  She is oliguric and creatinine level trending up.  She does have peripheral edema.  I will order a dose of Lasix after albumin today.  She has a poor prognosis and currently DNR.  Noted discussion about comfort care ongoing with the primary team.  Continue current conservative management.  Please avoid morphine for the pain medication but can try Dilaudid if needed for pain management.  #Decompensated liver cirrhosis: Per primary team.  #Hypotension -on midodrine and albumin.  # Hyponatremia, hypervolemic due to liver cirrhosis physiology.  Continue fluid restriction.  A dose of Lasix today.  #ETOH abuse disorder #Hepatic encephalopathy  #EOL - prognosis is poor, recommend palliative consultation.  She is currently DNR.  I discussed about poor prognosis with the patient as well.  Really nothing further to add except as outlined above.  Subjective: Seen and examined at the bedside.  Urine output is recorded only 75 cc.  She does not feel well and reports generalized body pain.  Objective Vital signs in last 24 hours: Vitals:   09/09/22 2206 09/10/22 0404 09/10/22 0600 09/10/22 0817  BP: 112/85 108/88  117/78  Pulse: (!) 59 62  (!) 53  Resp: '17 18  17  '$ Temp: 98.5 F (36.9 C) 97.6 F (36.4 C)  97.8 F (36.6 C)  TempSrc: Oral Oral  Oral  SpO2: 98% 97%  96%  Weight:   68.4 kg   Height:       Weight change: 0 kg  Intake/Output Summary (Last 24 hours) at 09/10/2022 1145 Last data filed at 09/09/2022 1700 Gross per 24 hour  Intake 220 ml  Output 75 ml  Net 145 ml        Labs: RENAL PANEL Recent Labs    01/22/22 2021 01/23/22 0000 01/23/22 0508 01/24/22 0511 03/01/22 0831 03/02/22 0319 03/03/22 0602  03/11/22 0459 03/12/22 0315 05/06/22 0000 05/13/22 1941 05/14/22 0424 05/15/22 0443 06/29/22 1540 07/04/22 0449 07/05/22 0865 07/06/22 0208 07/08/22 0419 08/08/22 1328 09/02/22 1430 09/03/22 0328 09/04/22 0313 09/05/22 0112 09/06/22 7846 09/07/22 0221 09/08/22 0155 09/09/22 0229 09/10/22 0329  NA  --  135 135   < >  --  131*   < > 139 136   < > 135   < > 131*   < > 134* 135 132*   < > 135 136 135 135 133* 135 135 133* 131* 134*  K  --  3.3* 3.5   < >  --  3.5   < > 3.8 4.0   < > 3.8   < > 3.9   < > 4.7 3.9 3.9   < > 3.0* 3.1* 4.1 3.7 4.2 3.8 3.6 3.9 4.2 4.4  CL  --  100 100   < >  --  101   < > 109 104   < > 101   < > 102   < > 99 97* 94*   < > 99 96* 97* 100 100 97* 101 101 96* 98  CO2  --  24 24   < >  --  23   < > 23 25   < > 24   < > 21*   < > '27 30 29   '$ < > 24 26 24  $'27 22 26 24 'r$ 17* 25 24  GLUCOSE  --  131* 92   < >  --  132*   < > 91 104*  --  104*   < > 94   < > 109* 86 104*   < > 83 60* 72 88 111* 93 118* 95 111* 96  BUN  --  <5* <5*   < >  --  5*   < > 6 8   < > 6   < > 8   < > '16 15 13   '$ < > 6 <5* <5* <5* <5* 5* '7 11 14 19  '$ CREATININE  --  0.51 0.53   < >  --  0.55   < > 0.44 0.53   < > 0.63   < > 0.56   < > 0.48 0.64 0.55   < > 0.55 0.59 0.71 0.70 1.09* 1.94* 2.58* 3.11* 3.87* 4.58*  CALCIUM  --  7.5* 7.5*   < >  --  8.2*   < > 7.6* 8.4*   < > 8.0*   < > 7.7*   < > 8.8* 9.3 8.3*   < > 8.1* 8.1* 8.1* 8.0* 7.9* 8.2* 8.2* 8.5* 8.5* 8.5*  MG  --  1.9 1.8   < > 1.7 1.7  --  1.9 1.7  --  1.5*  --  1.8  --   --   --  1.5*  --   --  1.3* 2.1 1.6*  --   --   --   --   --   --   PHOS 2.4* 1.8* 2.0*  --   --   --   --  3.1  --   --   --   --  2.5  --   --   --  3.9  --   --   --   --   --   --   --   --   --   --   --   ALBUMIN  --   --  2.4*   < >  --  2.3*   < > 2.5* 2.7*   < > 2.3*   < > 2.1*   < > 1.8* 1.9* 1.8*  --  2.2* 2.2* 2.1*  --  1.9*  --  2.6*  --  2.6* 2.5*   < > = values in this interval not displayed.      Liver Function Tests: Recent Labs  Lab 09/07/22 0221  09/09/22 0229 09/10/22 0329  AST 43* 28 28  ALT '16 11 10  '$ ALKPHOS 110 96 98  BILITOT 1.3* 1.7* 1.6*  PROT 5.2* 5.0* 4.8*  ALBUMIN 2.6* 2.6* 2.5*    No results for input(s): "LIPASE", "AMYLASE" in the last 168 hours. No results for input(s): "AMMONIA" in the last 168 hours.  CBC: Recent Labs    12/18/21 1533 12/24/21 0000 01/22/22 2021 01/22/22 2025 01/23/22 0508 02/18/22 1420 02/25/22 0000 05/20/22 1417 05/27/22 0000 09/04/22 0313 09/05/22 0112 09/06/22 0643 09/07/22 0221 09/08/22 0155 09/09/22 0229 09/10/22 0329  HGB  --    < >  --   --    < >  --    < >  --    < > 12.9   < > 12.8 11.2* 12.1 11.3* 11.1*  MCV  --    < >  --   --    < >  --    < >  --    < >  87.6   < > 88.8 89.1 89.8 90.1 89.0  VITAMINB12  --   --  1,073*  --   --   --   --   --   --  2,150*  --   --   --   --   --   --   FOLATE  --   --  14.2  --   --   --   --   --   --  26.2  --   --   --   --   --   --   FERRITIN 22  --  23  --   --  12  --  23  --   --   --   --   --   --   --   --   TIBC 346  --  308  --   --  417  --  412  --   --   --   --   --   --   --   --   IRON 29  --  41  --   --  27*  --  18*  --   --   --   --   --   --   --   --   RETICCTPCT  --   --   --  2.1  --   --   --   --   --   --   --   --   --   --   --   --    < > = values in this interval not displayed.     Cardiac Enzymes: No results for input(s): "CKTOTAL", "CKMB", "CKMBINDEX", "TROPONINI" in the last 168 hours. CBG: No results for input(s): "GLUCAP" in the last 168 hours.  Iron Studies: No results for input(s): "IRON", "TIBC", "TRANSFERRIN", "FERRITIN" in the last 72 hours. Studies/Results: No results found.  Medications: Infusions:  albumin human      Scheduled Medications:  ARIPiprazole  15 mg Oral Daily   Chlorhexidine Gluconate Cloth  6 each Topical Daily   clonazePAM  0.5 mg Oral BID   doxycycline  100 mg Oral Q12H   DULoxetine  60 mg Oral QHS   lactulose  10 g Oral TID   midodrine  15 mg Oral TID WC    octreotide  150 mcg Subcutaneous Q12H   pantoprazole  40 mg Oral Daily   rifaximin  550 mg Oral BID    have reviewed scheduled and prn medications.  Physical Exam: General: Ill looking female, lying on bed Heart:RRR, s1s2 nl Lungs:clear b/l, no crackle Abdomen:soft, Non-tender, distention present Extremities: Peripheral pitting edema present++ Neurology: Alert, awake and following commands  Briana Sweeney Briana Sweeney 09/10/2022,11:45 AM  LOS: 8 days

## 2022-09-10 NOTE — Progress Notes (Addendum)
HD#8 Subjective:    Patient evaluated at bedside this AM.PT stated she slept too late. Appetite is not very good, still in pain. Able to state name, birthday, location, city, year, month. Pt comfortable right now. Knows she had an infection of bone, never been told anything about her liver she says. Later states that she was 52 and told she had a scar of the liver. States she wants to go home. Would want to come back to the hospital if sick but later says she does not want any uncomfortable measures to be done. Just wants to be comfortable until she passes away.  Ideal situation would be to go home until she can't stay there any longer.   Objective:  Vital signs in last 24 hours: Vitals:   09/09/22 2206 09/10/22 0404 09/10/22 0600 09/10/22 0817  BP: 112/85 108/88  117/78  Pulse: (!) 59 62  (!) 53  Resp: '17 18  17  '$ Temp: 98.5 F (36.9 C) 97.6 F (36.4 C)  97.8 F (36.6 C)  TempSrc: Oral Oral  Oral  SpO2: 98% 97%  96%  Weight:   68.4 kg   Height:       Supplemental O2: Room Air SpO2: 96 %   Physical Exam:  Constitutional: Chronically ill appearing resting in bed in no acute distress, slightly tearful on exam HENT: Nonerythematous conjunctiva, no scleral icterus Cardiovascular: regular rate and rhythm, no m/r/g Pulmonary/Chest: Crackles heard to bilateral lung fields Abdomen: Soft, mild tenderness to palpation diffusely, abdominal distention  Neuro: Patient alert and oriented x 4, no asterixis Extremities: Right lower extremity stump wound healing with no drainage or erythema. LLE without warmth or erythema.2+ pitting edema to bilateral lower extremities.  Filed Weights   09/08/22 0500 09/09/22 0500 09/10/22 0600  Weight: 67 kg 68.4 kg 68.4 kg     Intake/Output Summary (Last 24 hours) at 09/10/2022 1235 Last data filed at 09/09/2022 1700 Gross per 24 hour  Intake 220 ml  Output 75 ml  Net 145 ml    Net IO Since Admission: 4,940.12 mL [09/10/22 1235]  Pertinent  Labs:    Latest Ref Rng & Units 09/10/2022    3:29 AM 09/09/2022    2:29 AM 09/08/2022    1:55 AM  CBC  WBC 4.0 - 10.5 K/uL 6.6  6.8  5.4   Hemoglobin 12.0 - 15.0 g/dL 11.1  11.3  12.1   Hematocrit 36.0 - 46.0 % 33.1  34.7  36.1   Platelets 150 - 400 K/uL 52  47  33        Latest Ref Rng & Units 09/10/2022    3:29 AM 09/09/2022    2:29 AM 09/08/2022    1:55 AM  CMP  Glucose 70 - 99 mg/dL 96  111  95   BUN 6 - 20 mg/dL '19  14  11   '$ Creatinine 0.44 - 1.00 mg/dL 4.58  3.87  3.11   Sodium 135 - 145 mmol/L 134  131  133   Potassium 3.5 - 5.1 mmol/L 4.4  4.2  3.9   Chloride 98 - 111 mmol/L 98  96  101   CO2 22 - 32 mmol/L '24  25  17   '$ Calcium 8.9 - 10.3 mg/dL 8.5  8.5  8.5   Total Protein 6.5 - 8.1 g/dL 4.8  5.0    Total Bilirubin 0.3 - 1.2 mg/dL 1.6  1.7    Alkaline Phos 38 - 126 U/L 98  96  AST 15 - 41 U/L 28  28    ALT 0 - 44 U/L 10  11      Imaging: No results found.  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 female with a past medical history of alcohol use disorder, polysubstance use disorder, cirrhosis, hepatitis C, GERD, and seizures who presented to the emergency room with concerns of bilateral lower extremity pain and erythema.  MRI showed evidence of right lower extremity osteomyelitis and myositis, and patient admitted for further evaluation and management. During hospitalization developed HRS type 1 that is most likely terminal. Working on goals of care and safe discharge planning for what will most likely be comfort care.  Decompensated liver cirrhosis Hepatorenal syndrome  Hepatic encephalopathy Alcohol use disorder MELD today 28 with stable platelets, electrolytes, bili. Has  some confusion but grossly alert and oriented without asterixis.Patient continues to have worsening renal function with creatinine rising from 3.87 to 4.58 that has been thus far unresponsive to albumin, midodrine, octreotide. Has not been getting low dose 50g albumin over prior days so will start that today. Per nephrology who is following, will get lasix dose today. Will need to watch BP that has been soft with this, but HDS today. Volume status overall stable. Do not think renal replacement therapy would be appropriate at any point for this patient. I do suspect that her liver disease and HRS are terminal with days to weeks left barring further treatment. Palliative to talk to the patient today. Per my conversation with her it seems her goals are aligned with comfort care. We will continue working on a safe discharge plan and considering inpatient hospice vs hospice at home or SNF. -Continue rifaximin -Continue octreotide -Continue midodrine -Start albumin 50g daily -Continue lactulose, titrate to goal of 3 bowel movements per day -Continue to hold spironolactone, nadolol -Strict I's and O's and daily weights -Nephrology following, added Lasix today -Continue to monitor blood pressure closely   Right lower extremity osteomyelitis Right posterior calf myositis Left lower extremity cellulitis Remains afebrile with wbc wnl. Bilateral cellulitis improving with no erythema, warmth or drainage noted.  Patient is on day 5 of doxycycline and will discharge on oral doxycycline until stopped due to [place] or until follow up with Dr. Sharol Given in the outpatient setting. -Ortho following -Monitor fever curve -Stump shrinker and compression stockings  Thrombocytopenia Platelet count stable.  No evidence of bleeding noted.  Of note, patient was taking Nplate in the outpatient setting.  Will consider restarting during hospitalization. -Continue to hold Lovenox -Monitor CBC  Acute psychosis likely secondary to  substance use, resolved Anxiety/depression No concerns currently. -Continue Abilify 15 mg daily -Continue Klonopin 0.5 mg twice daily -Continue Cymbalta 60 mg daily -Removed Gabapentin yesterday  -Psychiatry following -Haldol and Ativan as needed for agitation  GERD -Continue pantoprazole 40 mg daily   Diet: 2 gram sodium diet  IVF: None  VTE: None Code: Full PT/OT recs: SNF for Subacute PT  Dispo: Anticipated discharge to Home in 3 days pending clinical improvement.   Iona Coach, MD Internal Medicine Resident, PGY-1 [facility]  Pager: [phone]

## 2022-09-10 NOTE — TOC Initial Note (Signed)
Transition of Care [facility]) - Initial/Assessment Note    Patient Details  Name: Briana Sweeney MRN: [id number] Date of Birth: 01/09/1971  Transition of Care [facility]) CM/SW Contact:    Carles Collet, RN Phone Number: 09/10/2022, 3:33 PM  Clinical Narrative:                  Spoke to the patient at bedside to introduce myself. Patient has some confusion and didn't understand what hospice was, which is not entirely surprising due to her confusion, however her main objective appears to be getting home. She kept repeating to me that she wanted to go home, and when talking to her sister Briana Sweeney she feels, as do I, that she may be saying what she feels she needs to so she can go home. Although it is not unreasonable for confused patients to discharge home w hospice, hospice will not provide 24 hour supervision which she will need to be safe.    Her sister feels like Briana Sweeney wouldn't agree to a SNF or a hospice facility (even if it was determined she would qualify). Briana Sweeney cites neighbors that help her, although her Briana Sweeney presents doubts to how much of that is actually reliable. For example, Briana Sweeney told Briana Sweeney she didn't have toiletries to take a shower, so her sister sent her toiletries and a shower curtain Dec. 1. Briana Sweeney states that the shower still doesn't have a rod or curtain set up, and is very concerned that Briana Sweeney does not have the level of support she claims to. Briana Sweeney allowed me to reached out to her neighbor, there was no answer, LVM, no call back thus far.  Briana Sweeney  lives in a 6th floor apartment, and I would be concerned with how she would do if she she drank and fell, or if there was a fire, could she get up and out and down 6 stories... Her sister lives 4 hours away, and is going to be here tomorrow morning to lay eyes on her and get a better idea of how far she is from the baseline she knows her to be at. She has my number and her and I will touch base and see where to go from there.   I have  discussed Briana Sweeney's concerns with Briana Koyanagi LCSW and due to patient's current confusion she feels that an APS report would need to be made if patient were to DC home w/o supervision. Case also discussed with [name] RN MSN CM TOC Supervisor.    Barriers to Discharge: Continued Medical Work up, Unsafe home situation   Patient Goals and CMS Choice Patient states their goals for this hospitalization and ongoing recovery are:: patient (confused) stating repeatedly that she wants to go home          Expected Discharge Plan and Services In-house Referral: Hospice / Palliative Care Discharge Planning Services: CM Consult                                          Prior Living Arrangements/Services                       Activities of Daily Living Home Assistive Devices/Equipment: Wheelchair ADL Screening (condition at time of admission) Patient's cognitive ability adequate to safely complete daily activities?: No Is the patient deaf or have difficulty hearing?: No Does the patient have difficulty seeing, even  when wearing glasses/contacts?: No Does the patient have difficulty concentrating, remembering, or making decisions?: Yes Patient able to express need for assistance with ADLs?: Yes Does the patient have difficulty dressing or bathing?: Yes Independently performs ADLs?: Yes (appropriate for developmental age) Does the patient have difficulty walking or climbing stairs?: Yes Weakness of Legs: Both Weakness of Arms/Hands: None  Permission Sought/Granted                  Emotional Assessment              Admission diagnosis:  Hypokalemia [E87.6] Hypomagnesemia [E83.42] Confusion [R41.0] Cellulitis of right lower extremity [L03.115] Cellulitis of lower extremity, unspecified laterality [J47.829] Patient Active Problem List   Diagnosis Date Noted   Hepatorenal syndrome ([place]) 09/09/2022   Osteomyelitis of right tibia ([place]) 09/05/2022    Delirium due to another medical condition 09/03/2022   Cellulitis of right lower extremity 09/02/2022   Hx of BKA, right ([place]) 07/15/2022   Osteomyelitis of right foot ([place]) 07/05/2022   Cutaneous abscess of right foot 07/02/2022   MRSA bacteremia    Foot osteomyelitis, right ([place]) 06/30/2022   Sepsis ([place]) 06/29/2022   Portal vein thrombosis 05/15/2022   History of ITP 05/15/2022   GERD without esophagitis 05/14/2022   C. 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 ([place])    Abdominal pain 10/31/2021   Palpitations 10/11/2021   Bradycardia 07/23/2021   Secondary esophageal varices with bleeding ([place])    Alcohol withdrawal ([place]) 06/26/2021   AKI (acute kidney injury) ([place]) 04/04/2021   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HCC) 12/18/2020   Chronic anemia 10/02/2020   Hyponatremia 04/02/2019   Anxiety and depression 10/27/2018   Neuropathy 10/27/2018   Substance induced mood disorder ([place]) 07/17/2018   Encephalopathy, portal systemic 04/08/2018   Seizure disorder ([place]) 03/18/2018   Hepatitis C 03/18/2018   Portal hypertension ([place]) 03/18/2018   Depression 03/18/2018   Thrombocytopenia ([place]) 01/02/2018   GERD (gastroesophageal reflux disease) 11/01/2017   Alcohol use disorder, severe, dependence ([place]) 10/29/2017   Esophageal varices without bleeding ([place])    Abdominal ascites    Coagulopathy ([place]) 07/23/2017   Pancytopenia ([place]) 07/23/2017   PCP:  Marice Potter,  MD Pharmacy:   [place] [address] Phone: [phone] Fax: [phone]  [facility] [place], [address] [place] [address] Phone: [phone] Fax: [phone]  [id number] [place] [place]Phone: [phone]: [phone]    Social Determinants of Health ([place] Social History: [place] No Food Insecurity (05/14/2022)  Housing: Low Risk  (05/14/2022)  Transportation Needs: No Transportation Needs (05/14/2022)  Utilities: Not At Risk (05/14/2022)  Alcohol Screen: Medium Risk (08/25/2018)  Depression (PHQ2-9): Low Risk  (05/26/2020)  Tobacco Use: Low Risk  (09/08/2022)   SDOH Interventions:  Readmission Risk Interventions    03/01/2022   12:43 PM 02/02/2022   12:29 PM  Readmission Risk Prevention Plan  Transportation Screening Complete Complete  Medication Review ([place]) Complete Complete  PCP or Specialist appointment within 3-5 days of discharge Complete Complete  HRI or Home Care Consult Complete Complete  SW Recovery Care/Counseling Consult Complete Complete  Palliative Care Screening Not Applicable Complete  [place] Not Applicable Not Applicable

## 2022-09-10 NOTE — Progress Notes (Signed)
Physical Therapy Treatment Patient Details Name: Briana Sweeney MRN: [id number] DOB: 04/04/1971 Today's Date: 09/10/2022   History of Present Illness Pt admitted for LLE cellulitis and confusion.  PMH includes ETOH abuse, polysubstance abuse, cirrhosis, GRD, seizures, anxiety, depression, hepatitis C, CKD. Recent R BKA 2 months ago. She reports that she has had L leg pain swelling and redness that has been worsening over the past week. She states that the area of her R residual limb pain did previously resolve but has come back. Pain was severe on presentation to ED on 12/25. Pt uses W/C for mobility at baseline    PT Comments    Pt presents with improved stability of the LLE this session from last session with 1 person assistance using AD to stand EOB. Pt attempted to perform ADL's while sitting EOB including  donning sock and then standing at EOB including donning mesh brief but was unable without physical assistance due to poor balance, weakness and ROM deficits. Due to pt current functional status, home set up and available assistance at home recommend skilled physical therapy services in SNF setting on discharge from acute care hospital setting in order to decrease risk for falls, injury and re-hospitalization.    Recommendations for follow up therapy are one component of a multi-disciplinary discharge planning process, led by the attending physician.  Recommendations may be updated based on patient status, additional functional criteria and insurance authorization.  Follow Up Recommendations  Skilled nursing-short term rehab (<3 hours/day) Can patient physically be transported by private vehicle: No   Assistance Recommended at Discharge Frequent or constant Supervision/Assistance  Patient can return home with the following A little help with walking and/or transfers;Assist for transportation;Help with stairs or ramp for entrance;Assistance with cooking/housework   Equipment  Recommendations  Other (comment) (defer to post acute.)    Recommendations for Other Services       Precautions / Restrictions Precautions Precautions: Fall Precaution Comments: hx of R BKA Restrictions Weight Bearing Restrictions: Yes Other Position/Activity Restrictions: R BKA     Mobility  Bed Mobility Overal bed mobility: Needs Assistance Bed Mobility: Supine to Sit, Sit to Supine     Supine to sit: Min assist, HOB elevated Sit to supine: Min assist, HOB elevated   General bed mobility comments: Pt requires Min A at the trunk and then at the LE for supine<>sit, uses rails with HOB slightly elevated. Patient Response: Flat affect  Transfers Overall transfer level: Needs assistance Equipment used: Rolling walker (2 wheels) Transfers: Sit to/from Stand Sit to Stand: Min guard           General transfer comment: Pt demonstrates improved stability in the LLE with standing today; able to stand with assistance for donning mesh brief and donning barrier cream due to irritation at the gluteal cleft.    Ambulation/Gait               General Gait Details: Unable to progress today due to fatigue.       Balance Overall balance assessment: Needs assistance Sitting-balance support: No upper extremity supported, Single extremity supported, Feet unsupported, Feet supported Sitting balance-Leahy Scale: Good     Standing balance support: Bilateral upper extremity supported, During functional activity, Reliant on assistive device for balance Standing balance-Leahy Scale: Poor Standing balance comment: Pt wanted to assist with donning brief but was unable due to poor balance in standing. Able to assist some in sitting but due to weakness/ROM deficits pt unable to don brief or sock without  assistance.                            Cognition Arousal/Alertness: Awake/alert Behavior During Therapy: Flat affect Overall Cognitive Status: No family/caregiver present  to determine baseline cognitive functioning             General Comments General comments (skin integrity, edema, etc.): Pt has redness and small open areas on the glutes at gluteal cleft. ; nursing made aware.      Pertinent Vitals/Pain Pain Assessment Pain Assessment: 0-10 Pain Score: 6  Pain Intervention(s): Limited activity within patient's tolerance, Monitored during session, Repositioned     PT Goals (current goals can now be found in the care plan section) Acute Rehab PT Goals Patient Stated Goal: Pt would like to go home PT Goal Formulation: With patient Time For Goal Achievement: 09/19/22 Potential to Achieve Goals: Fair Progress towards PT goals: Progressing toward goals    Frequency    Min 2X/week      PT Plan Current plan remains appropriate       AM-PAC PT "6 Clicks" Mobility   Outcome Measure  Help needed turning from your back to your side while in a flat bed without using bedrails?: A Little Help needed moving from lying on your back to sitting on the side of a flat bed without using bedrails?: A Little Help needed moving to and from a bed to a chair (including a wheelchair)?: A Little Help needed standing up from a chair using your arms (e.g., wheelchair or bedside chair)?: A Little Help needed to walk in hospital room?: Total Help needed climbing 3-5 steps with a railing? : Total 6 Click Score: 14    End of Session Equipment Utilized During Treatment: Gait belt Activity Tolerance: Patient tolerated treatment well Patient left: in bed;with call bell/phone within reach;with bed alarm set Nurse Communication: Mobility status PT Visit Diagnosis: Other abnormalities of gait and mobility (R26.89);Muscle weakness (generalized) (M62.81);History of falling (Z91.81)     Time: 2924-4628 PT Time Calculation (min) (ACUTE ONLY): 16 min  Charges:  $Therapeutic Activity: 8-22 mins                    Tomma Rakers, DPT, CLT  Acute Rehabilitation  Services Office: [phone] (Secure chat preferred)    Ander Purpura 09/10/2022, 12:52 PM

## 2022-09-10 NOTE — Consult Note (Cosign Needed Addendum)
Consultation Note Date: 09/10/2022   Patient Name: Briana Sweeney  DOB: 17-Oct-1970  MRN: [id number]  Age / Sex: 52 y.o., female  PCP: Marice Potter, MD Referring Physician: Charise Killian, MD  Reason for Consultation:  end stage liver disease, GOC  HPI/Patient Profile: 52 y.o. 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 She was also confused and delirious. Further workup revealed RLE osteomyelitis and myositis. Her admission and recovery have been complicated by hepatorenal syndrome that has not been responsive to treatment. Her creatinine is continuing to trend up. No good options per nephrology. Palliative medicine consulted for above.      Primary Decision Maker NEXT OF KIN - sister - [name] Rale  Discussion:  Chart reviewed including labs, progress notes, imaging from this and previous encounters.  Met with Salome. Introduced Palliative medicine.  Palliative medicine is specialized medical care for people living with serious illness. It focuses on providing relief from the symptoms and stress of a serious illness. The goal is to improve quality of life for both the patient and the family. Without prompting Pamila told me she is dying and she just wants to go home and be comfortable.  Jaquaya did have some confusion, she was unable to recall the name of her friend from her church.  But she was very clear in her understanding that she is at end-of-life and wants to spend that time in her home.  She has several neighbors that assist her.  She also has a sister and we discussed that her sister would be her  surrogate decision maker should Mandee not be able to make her own decisions. We discussed symptom management-Patty has ongoing pain in her lower extremities.  No other symptoms currently. I also called and spoke with patient's sister Venida Jarvis.  She had just spoken with her sister. I reviewed with [name] patient's illness trajectory and poor prognosis. I discussed transition to full comfort measures. Discussed transition to comfort measures only which includes stopping IV fluids, antibiotics, labs and providing symptom management for SOB, anxiety, nausea, vomiting, and other symptoms of dying.   Hosspice philosophy and services were also discussed.  [name] was in agreement with this plan of care.  I shared my concern about Tymia being in her home by herself- she is likely to decompensate somewhat quickly -[name] noted that her neighbors have been very involved in her care.  We discussed possibly going home with help from hospice that does not provide 24-hour care for now and then should she worsen and not be able to be at home by herself any longer hospice can assist with disposition.  She was in agreement she noted that Rayan is saying she just wants to be at home with her  dog. [name] asked about in patient hospice- I think inpatient hospice would be very helpful, patient is likely within weeks of end of life- will defer to hospice if she is inpatient hospice eligible.   SUMMARY OF RECOMMENDATIONS   -Transition to full comfort measures only -Schedule hydromorphone 66m po q6 hours with 266mpo q2 hours prn severe pain -Pleurx cath may be helpful if she is having frequently recurring ascites requiring paracentesis- her abdomen was distended today and she may benefit from paracentesis before discharge -Appreciate TOC referral for hospice services- will defer home with hospice vs inpatient hospice to hospice company  Code Status/Advance Care Planning: DNR   Prognosis:   < 2 weeks  Discharge Planning: To  Be Determined  Primary Diagnoses: Present on Admission:  Cellulitis of right lower extremity  Decompensated hepatic cirrhosis (HCC)  AKI (acute kidney injury) ([place]  Review of Systems  Physical Exam Vitals and nursing note reviewed.  Constitutional:      Appearance: She is ill-appearing.  Abdominal:     General: There is distension.  Neurological:     Mental Status: She is alert.  Psychiatric:     Comments: Some confusion in her thought processes     Vital Signs: BP 117/78 (BP Location: Right Arm)   Pulse (!) 53   Temp 97.8 F (36.6 C) (Oral)   Resp 17   Ht 5' (1.524 m)   Wt 68.4 kg   LMP  (LMP Unknown) Comment: MENAPAUSE AT [age]EARS OLD  SpO2 96%   BMI 29.45 kg/m  Pain Scale: 0-10 POSS *See Group Information*: 1-Acceptable,Awake and alert Pain Score: 8    SpO2: SpO2: 96 % O2 Device:SpO2: 96 % O2 Flow Rate: .   IO: Intake/output summary:  Intake/Output Summary (Last 24 hours) at 09/10/2022 1420 Last data filed at 09/09/2022 1700 Gross per 24 hour  Intake --  Output 75 ml  Net -75 ml    LBM: Last BM Date : 09/08/22 Baseline Weight: Weight: 60.8 kg Most recent weight: Weight: 68.4 kg       Thank you for this consult. Palliative medicine will continue to follow and assist as needed.  Time Total: 90 minutes Greater than 50%  of this time was spent counseling and coordinating care related to the above assessment and plan.  Signed by: KaMariana KaufmanAGNP-C Palliative Medicine    Please contact Palliative Medicine Team phone at [phone] questions and concerns.  For individual provider: See AmShea Evans

## 2022-09-11 ENCOUNTER — Inpatient Hospital Stay (HOSPITAL_COMMUNITY): Payer: No Typology Code available for payment source

## 2022-09-11 ENCOUNTER — Other Ambulatory Visit: Payer: No Typology Code available for payment source

## 2022-09-11 DIAGNOSIS — K767 Hepatorenal syndrome: Secondary | ICD-10-CM | POA: Diagnosis not present

## 2022-09-11 DIAGNOSIS — Z7189 Other specified counseling: Secondary | ICD-10-CM | POA: Diagnosis not present

## 2022-09-11 DIAGNOSIS — Z515 Encounter for palliative care: Secondary | ICD-10-CM

## 2022-09-11 DIAGNOSIS — K7031 Alcoholic cirrhosis of liver with ascites: Secondary | ICD-10-CM | POA: Diagnosis not present

## 2022-09-11 HISTORY — PX: IR PARACENTESIS: IMG2679

## 2022-09-11 MED ORDER — CAMPHOR-MENTHOL 0.5-0.5 % EX LOTN: TOPICAL_LOTION | CUTANEOUS | Status: DC | PRN

## 2022-09-11 MED ORDER — HYDROXYZINE HCL 10 MG PO TABS
10.0000 mg | ORAL_TABLET | Freq: Three times a day (TID) | ORAL | Status: DC | PRN
  Administered 2022-09-11: 10 mg via ORAL
  Filled 2022-09-11: qty 1

## 2022-09-11 MED ORDER — LIDOCAINE HCL 1 % IJ SOLN
INTRAMUSCULAR | Status: AC
  Filled 2022-09-11: qty 20

## 2022-09-11 NOTE — Discharge Summary (Signed)
Name: Briana Sweeney MRN: [id number] DOB: [birth year] 52 y.o. PCP: Marice Potter, MD  Date of Admission: 09/02/2022  1:18 PM Date of Discharge: 09/11/21 Attending Physician: Campbell Riches, MD  Discharge Diagnosis: 1. Active Problems:   Hepatorenal syndrome (HCC)   Abdominal ascites   Anxiety and depression   Decompensated hepatic cirrhosis (HCC)  Discharge Medications: Allergies as of 09/11/2022       Reactions   Other Other (See Comments)   Platelets: Rx chest pain, tremors, body aches Patient stated that she receives platelet injection every Wednesday         Medication List     STOP taking these medications    acamprosate 333 MG tablet Commonly known as: CAMPRAL   acetaminophen 325 MG tablet Commonly known as: TYLENOL   ARIPiprazole 15 MG tablet Commonly known as: ABILIFY   ascorbic acid 1000 MG tablet Commonly known as: VITAMIN C   clonazePAM 0.5 MG tablet Commonly known as: KLONOPIN   DULoxetine 60 MG capsule Commonly known as: CYMBALTA   FLUoxetine 20 MG capsule Commonly known as: PROZAC   folic acid 1 MG tablet Commonly known as: FOLVITE   furosemide 40 MG tablet Commonly known as: LASIX   gabapentin 300 MG capsule Commonly known as: NEURONTIN   hydrocortisone 2.5 % rectal cream Commonly known as: ANUSOL-HC   HYDROmorphone 4 MG tablet Commonly known as: DILAUDID   lactulose 10 GM/15ML solution Commonly known as: CHRONULAC   levETIRAcetam 750 MG tablet Commonly known as: KEPPRA   multivitamin with minerals Tabs tablet   nadolol 20 MG tablet Commonly known as: Corgard   nutrition supplement (JUVEN) Pack   pantoprazole 40 MG tablet Commonly known as: PROTONIX   spironolactone 100 MG tablet Commonly known as: ALDACTONE   thiamine 100 MG tablet Commonly known as: Vitamin B-1   traZODone 50 MG tablet Commonly known as: [place]               Discharge Care Instructions  (From admission, onward)            Start     Ordered   09/11/22 0000  Discharge wound care:       Comments: Change bandage to right residual limb to keep clean and dry   09/11/22 1724            Disposition and follow-up:   Briana Sweeney was discharged from [facility] in Serious condition.  She is being discharge to inpatient hospice with expected death in days to week.  1.  Therapeutic paracentesis completed 01/03 with 2.3 L removed. 2. She is having some pain in right residual limb  Hospital Course by problem list: #Decompensated liver cirrhosis #Hepatorenal syndrome  #Hypotension #Hepatic encephalopathy #Alcohol use disorder Patient presented with decompensated liver cirrhosis and unfortunately hospital course complicated by hepatorenal syndrome. Nephrology was consulted with no additional recommendations. She was given IV albumin, octreotide, and midodrine without improvement in renal function.  She was made DNR/DNI on September 09, 2022.  Palliative care was consulted and patient addition to comfort measures on January 2.  She received therapeutic paracentesis on January 3 with 2.3 L removed.  Discharged to inpatient hospice in [name].  #Right lower extremity osteomyelitis #Right posterior calf myositis #Left lower extremity cellulitis Patient initially came in for right lower extremity pain and erythema.  Patient found to have osteomyelitis of right lower extremity.  Patient was initially started on vancomycin, transition to doxycycline and plan for outpatient  follow-up with Dr. Sharol Given.     #Thrombocytopenia Patient had decreased platelets noted during hospitalization.  This is likely in the setting of alcoholic liver cirrhosis and splenic sequestration.    #Acute psychosis likely secondary to substance use, resolved #Anxiety/depression Patient initially had acute psychosis likely secondary to substance use upon arrival to the hospital.     Discharge Exam:   BP (!) 94/52 (BP  Location: Right Arm)   Pulse 81   Temp 98 F (36.7 C) (Oral)   Resp 16   Ht 5' (1.524 m)   Wt 69.9 kg   LMP  (LMP Unknown) Comment: MENAPAUSE AT 52 YEARS OLD  SpO2 97%   BMI 30.10 kg/m  Discharge exam:  Constitutional: Chronically ill appearing resting in bed in no acute distress, slightly tearful on exam HENT: Nonerythematous conjunctiva, no scleral icterus Cardiovascular: regular rate and rhythm, no m/r/g Pulmonary/Chest: Crackles heard to bilateral lung fields Abdomen: Soft, mild tenderness to palpation diffusely, abdominal distention  Neuro: Patient alert and oriented to name, location, city, and situation Extremities: Right lower extremity stump wound healing with no drainage or erythema. LLE without warmth or erythema.2+ pitting edema to bilateral lower extremities.  Pertinent Labs, Studies, and Procedures:     Latest Ref Rng & Units 09/10/2022    3:29 AM 09/09/2022    2:29 AM 09/08/2022    1:55 AM  BMP  Glucose 70 - 99 mg/dL 96  111  95   BUN 6 - 20 mg/dL '19  14  11   '$ Creatinine 0.44 - 1.00 mg/dL 4.58  3.87  3.11   Sodium 135 - 145 mmol/L 134  131  133   Potassium 3.5 - 5.1 mmol/L 4.4  4.2  3.9   Chloride 98 - 111 mmol/L 98  96  101   CO2 22 - 32 mmol/L '24  25  17   '$ Calcium 8.9 - 10.3 mg/dL 8.5  8.5  8.5         Latest Ref Rng & Units 09/10/2022    3:29 AM 09/09/2022    2:29 AM 09/08/2022    1:55 AM  CBC  WBC 4.0 - 10.5 K/uL 6.6  6.8  5.4   Hemoglobin 12.0 - 15.0 g/dL 11.1  11.3  12.1   Hematocrit 36.0 - 46.0 % 33.1  34.7  36.1   Platelets 150 - 400 K/uL 52  47  33      Discharge Instructions: Discharge Instructions     Discharge wound care:   Complete by: As directed    Change bandage to right residual limb to keep clean and dry       Signed: Christiana Fuchs, DO 09/11/2022, 5:33 PM   [phone]

## 2022-09-11 NOTE — Progress Notes (Signed)
HD#9 Subjective:    Patient evaluated at bedside this AM.Patient endorses right leg pain. She reports she has not voided since removal of foley catheter. She endorses need to void. She endorses poor memory. Oriented to name, location, city, and situation. Not oriented to year and DOB.She understands that she is going to die. She states safest plan is to go to hospice. Declines hospice inpatient. She reports she is able to care for herself with home hospice. She didn't understand that home hospice is not comprehensible all day care. She reports her neighbor can help her. She became tearful during the exam and did not realize her sister was in the room.  Objective:  Vital signs in last 24 hours: Vitals:   09/10/22 2143 09/11/22 0419 09/11/22 0628 09/11/22 0748  BP: 133/76 102/73  99/61  Pulse: 67 60  77  Resp: '18 18  16  '$ Temp:  97.7 F (36.5 C)  97.8 F (36.6 C)  TempSrc:  Oral  Oral  SpO2: 100% 95%  94%  Weight:   69.9 kg   Height:       Supplemental O2: Room Air SpO2: 94 %   Physical Exam:  Grossly unchanged from prior days Constitutional: Chronically ill appearing resting in bed in no acute distress, slightly tearful on exam HENT: Nonerythematous conjunctiva, no scleral icterus Cardiovascular: regular rate and rhythm, no m/r/g Pulmonary/Chest: Crackles heard to bilateral lung fields Abdomen: Soft, mild tenderness to palpation diffusely, abdominal distention  Neuro: Patient alert and oriented to name, location, city, and situation Extremities: Right lower extremity stump wound healing with no drainage or erythema. LLE without warmth or erythema.2+ pitting edema to bilateral lower extremities.  Filed Weights   09/09/22 0500 09/10/22 0600 09/11/22 0628  Weight: 68.4 kg 68.4 kg 69.9 kg     Intake/Output Summary (Last 24 hours) at 09/11/2022 1504 Last data filed at 09/11/2022 0930 Gross per 24 hour  Intake 360 ml  Output 250 ml  Net 110 ml    Net IO Since Admission:  5,510.12 mL [09/11/22 1504]  Pertinent Labs:    Latest Ref Rng & Units 09/10/2022    3:29 AM 09/09/2022    2:29 AM 09/08/2022    1:55 AM  CBC  WBC 4.0 - 10.5 K/uL 6.6  6.8  5.4   Hemoglobin 12.0 - 15.0 g/dL 11.1  11.3  12.1   Hematocrit 36.0 - 46.0 % 33.1  34.7  36.1   Platelets 150 - 400 K/uL 52  47  33        Latest Ref Rng & Units 09/10/2022    3:29 AM 09/09/2022    2:29 AM 09/08/2022    1:55 AM  CMP  Glucose 70 - 99 mg/dL 96  111  95   BUN 6 - 20 mg/dL '19  14  11   '$ Creatinine 0.44 - 1.00 mg/dL 4.58  3.87  3.11   Sodium 135 - 145 mmol/L 134  131  133   Potassium 3.5 - 5.1 mmol/L 4.4  4.2  3.9   Chloride 98 - 111 mmol/L 98  96  101   CO2 22 - 32 mmol/L '24  25  17   '$ Calcium 8.9 - 10.3 mg/dL 8.5  8.5  8.5   Total Protein 6.5 - 8.1 g/dL 4.8  5.0    Total Bilirubin 0.3 - 1.2 mg/dL 1.6  1.7    Alkaline Phos 38 - 126 U/L 98  96    AST 15 -  41 U/L 28  28    ALT 0 - 44 U/L 10  11      Imaging: No results found.  Assessment/Plan:   Active Problems:   Hepatorenal syndrome [facility])   Patient Summary: Briana Sweeney is a 52 y.o. female with a past medical history of alcohol use disorder, polysubstance use disorder, cirrhosis, hepatitis C, GERD, and seizures who presented to the emergency room with concerns of bilateral lower extremity pain and erythema.  MRI showed evidence of right lower extremity osteomyelitis and myositis, and patient admitted for further evaluation and management. During hospitalization developed HRS type 1 .Transitioned to comfort care and accepted to [facility]. Will get therapeutic paracentesis and likely D/C tomorrow 09/12/2022.  Decompensated liver cirrhosis Hepatorenal syndrome  Hepatic encephalopathy Alcohol use disorder Accepted to [facility] inpatient unit today. Per palliative team's conversation with the patient and the patient's sister, would like one further therapeutic paracentesis prior to discharge to hospice. Will plan for discharge  tomorrow after this and will continue comfort care measures. Patient understands terminal prognosis. -comfort care.  Right lower extremity osteomyelitis Right posterior calf myositis Left lower extremity cellulitis Will continue to control pain. -Dilaudid '2mg'$  PO q6hours -Dilaudid '2mg'$  PO q2hrs prn mod-severe pain -Dilaudid 0.'5mg'$  IV q2 hrs prn breakthrough pain  Acute psychosis likely secondary to substance use, resolved Anxiety/depression -Continue Abilify 15 mg daily -Continue Klonopin 0.5 mg twice daily -Continue Cymbalta 60 mg daily -Haldol and Ativan as needed for agitation  Diet: 2 gram sodium diet  IVF: None  VTE: None Code: Full PT/OT recs: SNF for Subacute PT  Dispo: Anticipated discharge to Home in 1 days pending clinical improvement.   Iona Coach, MD Internal Medicine Resident, PGY-1 [facility]  Pager: [phone]

## 2022-09-11 NOTE — Progress Notes (Signed)
Daily Progress Note   Patient Name: Briana Sweeney       Date: 09/11/2022 DOB: February 19, 1971  Age: 52 y.o. MRN#: [id number] Attending Physician: Campbell Riches, MD Primary Care Physician: Marice Potter, MD Admit Date: 09/02/2022  Reason for Consultation/Follow-up: Establishing goals of care  Patient Profile/HPI:   53 y.o. 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 She was also confused and delirious. Further workup revealed RLE osteomyelitis and myositis. Her admission and recovery have been complicated by hepatorenal syndrome that has not been responsive to treatment. Her creatinine is continuing to trend up. No good options per nephrology. Palliative medicine consulted for above.       Subjective:  Chart reviewed including labs, progress notes, imaging from this and previous encounters.  Patient's sister at bedside. Patient reports improved pain control with adjustment in pain medications. She is itching and scratching her arms and chest per [name]. Abdomen appears more distended. She is lethargic, but is continuing to eat and drink. Venida Jarvis has spoken with [facility] and they informed her they have accepted her and have a bed available.  I received call from [place] (liaison for [facility]) with questions about if patient needs paracentesis before discharge vs pleurx cath. I don't think pleurx cath is needed- but she would likely benefit from one more paracentesis  for comfort.    Physical Exam Vitals and nursing note reviewed.  Constitutional:      Appearance: She is ill-appearing.  Pulmonary:     Effort: Pulmonary effort is normal.  Abdominal:     General: There is distension.  Neurological:     Comments: sleepy             Vital Signs: BP 99/61 (BP Location: Right Arm)   Pulse 77   Temp 97.8 F (36.6 C) (Oral)   Resp 16   Ht 5' (1.524 m)   Wt 69.9 kg   LMP  (LMP Unknown) Comment: MENAPAUSE AT 52 YEARS OLD  SpO2 94%  BMI 30.10 kg/m  SpO2: SpO2: 94 % O2 Device: O2 Device: Room Air O2 Flow Rate:    Intake/output summary:  Intake/Output Summary (Last 24 hours) at 09/11/2022 1415 Last data filed at 09/11/2022 0930 Gross per 24 hour  Intake 360 ml  Output 250 ml  Net 110 ml   LBM: Last BM Date : 09/11/22 Baseline Weight: Weight: 60.8 kg Most recent weight: Weight: 69.9 kg       Palliative Assessment/Data: PPS: 20%      Patient Active Problem List   Diagnosis Date Noted   Hepatorenal syndrome ([place]) 09/09/2022   Osteomyelitis of right tibia ([place]) 09/05/2022   Delirium due to another medical condition 09/03/2022   Cellulitis of right lower extremity 09/02/2022   Hx of BKA, right ([place]) 07/15/2022   Osteomyelitis of right foot ([place]) 07/05/2022   Cutaneous abscess of right foot 07/02/2022   MRSA bacteremia    Foot osteomyelitis, right ([place]) 06/30/2022   Sepsis ([place]) 06/29/2022   Portal vein thrombosis 05/15/2022   History of ITP 05/15/2022   GERD without esophagitis 05/14/2022   C. 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 (HCC)    Abdominal pain 10/31/2021   Palpitations 10/11/2021   Bradycardia 07/23/2021   Secondary esophageal varices with bleeding (HCC)    Alcohol withdrawal ([place]) 06/26/2021   AKI (acute kidney injury) ([place]) 04/04/2021   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HCC) 12/18/2020   Chronic anemia 10/02/2020   Hyponatremia 04/02/2019   Anxiety and depression 10/27/2018   Neuropathy 10/27/2018   Substance induced mood disorder ([place]) 07/17/2018   Encephalopathy, portal systemic 04/08/2018   Seizure disorder ([place]) 03/18/2018   Hepatitis C 03/18/2018   Portal hypertension ([place]) 03/18/2018   Depression 03/18/2018   Thrombocytopenia ([place]) 01/02/2018   GERD (gastroesophageal reflux disease) 11/01/2017   Alcohol use disorder, severe, dependence ([place]) 10/29/2017   Esophageal varices without bleeding (HCC)    Abdominal ascites    Coagulopathy ([place]) 07/23/2017   Pancytopenia ([place]) 07/23/2017    Palliative Care Assessment & Plan    Assessment/Recommendations/Plan  Continue full comfort measures only- leg pain is improved with scheduled hydromorphone, abdomen is more distended today Recommend one time u/s paracentesis before discharge- I discussed this with her sister, Venida Jarvis- patient was actually requesting paracentesis yesterday-  and she is in agreement She has been approved for bed at [facility] in [place]   Code Status: DNR  Prognosis:  < 2 weeks  Discharge Planning: Hospice facility  Care plan was discussed with patient, care team and patient's family.   Thank you for allowing the Palliative Medicine Team to assist in the care of this patient.  Total time: 60 minutes  Greater than 50%  of this time was spent counseling and coordinating care related to the above assessment and plan.  Mariana Kaufman, AGNP-C Palliative Medicine   Please contact  Palliative Medicine Team phone at [phone] for questions and concerns.

## 2022-09-11 NOTE — Procedures (Signed)
PROCEDURE SUMMARY:  Successful US guided paracentesis from right lateral abdomen.  Yielded 2.3 liters of clear yellow fluid.  No immediate complications.  Patient tolerated well.  EBL = trace  Bralen Wiltgen S Naren Benally PA-C 09/11/2022 3:45 PM

## 2022-09-11 NOTE — Progress Notes (Signed)
Report called to [name], RN at hospice house. Family is at the bedtime at the time of transfer. [facility] provided transportation

## 2022-09-11 NOTE — TOC Progression Note (Signed)
Transition of Care [facility]) - Progression Note    Patient Details  Name: Briana Sweeney MRN: [id number] Date of Birth: [birth year]  [facility] CM/SW Contact  Carles Collet, RN Phone Number: 09/11/2022, 10:50 AM  Clinical Narrative:      08:45 Received call back from [facility], whom patient granted persission to talk to yesterday. Barbaraann Share states that her and her husband have known Ayano for 8 years. They live about 30 minutes away. Barbaraann Share states that she is familiar with Annabelle's home set up. She feels it "really is not a good place" for her to go back to and that PTA Herta "could not really  do anything for herself." She provides that Shanon's neighbor Vonda Antigua, helps her with her dog, but does not provide any personal care. She states that neither she noe her spouse would be able to help at home.   10:20 [name] called me to inform me she was on her way up. Spoke w patient and her sister Venida Jarvis at bedside. Reviewed conversation with Barbaraann Share with them both, as well as the team's concerns as a whole for her safety in her home situation. Reviewed with Precious Bard and [name] the compassionate specialized care that hospice provides, and locations and options for hospice facilities locally. Precious Bard and [name] discussed options for discharge, and although Indya is saddened by needing hospice care she is agreeable to be referred to [facility] with a preference for placement at their [facility] in [place] so she could be closer to friends.   [name] agreeable to be point of contact for HoP.   Patient and sister indicted that due to her instability, weakness and confusion they would prefer transportation by non emergency transport.   Referral placed to [facility] at HoP, who states that [facility] will review case and contact me later today. Per Manus Gunning if accepted, they may have bed availability today.       Barriers to Discharge: Continued Medical Work up, Unsafe home situation  Expected  Discharge Plan and Services In-house Referral: Hospice / Palliative Care Discharge Planning Services: CM Consult                                           Social Determinants of Health (SDOH) Interventions SDOH Screenings   Food Insecurity: No Food Insecurity (05/14/2022)  Housing: Low Risk  (05/14/2022)  Transportation Needs: No Transportation Needs (05/14/2022)  Utilities: Not At Risk (05/14/2022)  Alcohol Screen: Medium Risk (08/25/2018)  Depression (PHQ2-9): Low Risk  (05/26/2020)  Tobacco Use: Low Risk  (09/08/2022)    Readmission Risk Interventions    03/01/2022   12:43 PM 02/02/2022   12:29 PM  Readmission Risk Prevention Plan  Transportation Screening Complete Complete  Medication Review Press photographer) Complete Complete  PCP or Specialist appointment within 3-5 days of discharge Complete Complete  HRI or Home Care Consult Complete Complete  SW Recovery Care/Counseling Consult Complete Complete  Palliative Care Screening Not Applicable Complete  [place] Not Applicable Not Applicable

## 2022-09-12 ENCOUNTER — Encounter: Payer: Self-pay | Admitting: Oncology

## 2022-09-13 ENCOUNTER — Inpatient Hospital Stay: Payer: Commercial Managed Care - HMO

## 2022-09-13 ENCOUNTER — Other Ambulatory Visit (HOSPITAL_COMMUNITY): Payer: Self-pay

## 2022-09-14 ENCOUNTER — Encounter: Payer: Self-pay | Admitting: Oncology

## 2022-09-16 ENCOUNTER — Other Ambulatory Visit: Payer: No Typology Code available for payment source

## 2022-09-18 ENCOUNTER — Ambulatory Visit: Payer: No Typology Code available for payment source

## 2022-09-23 ENCOUNTER — Other Ambulatory Visit: Payer: No Typology Code available for payment source

## 2022-09-25 ENCOUNTER — Ambulatory Visit: Payer: No Typology Code available for payment source

## 2022-09-30 ENCOUNTER — Encounter
Payer: No Typology Code available for payment source | Attending: Physical Medicine and Rehabilitation | Admitting: Physical Medicine and Rehabilitation

## 2022-10-01 ENCOUNTER — Ambulatory Visit: Payer: Self-pay | Admitting: Plastic Surgery

## 2022-10-02 ENCOUNTER — Other Ambulatory Visit: Payer: No Typology Code available for payment source

## 2022-10-04 ENCOUNTER — Ambulatory Visit: Payer: No Typology Code available for payment source

## 2022-10-07 ENCOUNTER — Other Ambulatory Visit: Payer: No Typology Code available for payment source

## 2022-10-09 ENCOUNTER — Ambulatory Visit: Payer: No Typology Code available for payment source

## 2022-10-10 DEATH — deceased

## 2022-10-14 ENCOUNTER — Other Ambulatory Visit: Payer: No Typology Code available for payment source

## 2022-10-16 ENCOUNTER — Ambulatory Visit: Payer: No Typology Code available for payment source

## 2022-10-21 ENCOUNTER — Other Ambulatory Visit: Payer: No Typology Code available for payment source

## 2022-10-23 ENCOUNTER — Ambulatory Visit: Payer: No Typology Code available for payment source

## 2022-10-28 ENCOUNTER — Other Ambulatory Visit: Payer: No Typology Code available for payment source

## 2022-10-30 ENCOUNTER — Ambulatory Visit: Payer: No Typology Code available for payment source

## 2022-11-04 ENCOUNTER — Other Ambulatory Visit: Payer: No Typology Code available for payment source

## 2022-11-06 ENCOUNTER — Ambulatory Visit: Payer: No Typology Code available for payment source

## 2022-11-11 ENCOUNTER — Other Ambulatory Visit: Payer: No Typology Code available for payment source

## 2022-11-13 ENCOUNTER — Ambulatory Visit: Payer: No Typology Code available for payment source

## 2022-11-18 ENCOUNTER — Other Ambulatory Visit: Payer: No Typology Code available for payment source

## 2022-11-20 ENCOUNTER — Ambulatory Visit: Payer: No Typology Code available for payment source

## 2022-11-25 ENCOUNTER — Other Ambulatory Visit: Payer: No Typology Code available for payment source

## 2022-11-27 ENCOUNTER — Ambulatory Visit: Payer: No Typology Code available for payment source

## 2022-12-02 ENCOUNTER — Other Ambulatory Visit: Payer: No Typology Code available for payment source

## 2022-12-04 ENCOUNTER — Ambulatory Visit: Payer: No Typology Code available for payment source

## 2022-12-09 ENCOUNTER — Other Ambulatory Visit: Payer: No Typology Code available for payment source

## 2022-12-11 ENCOUNTER — Ambulatory Visit: Payer: No Typology Code available for payment source

## 2022-12-16 ENCOUNTER — Ambulatory Visit: Payer: No Typology Code available for payment source | Admitting: Oncology

## 2022-12-16 ENCOUNTER — Other Ambulatory Visit: Payer: No Typology Code available for payment source

## 2022-12-18 ENCOUNTER — Ambulatory Visit: Payer: No Typology Code available for payment source

## 2022-12-19 ENCOUNTER — Ambulatory Visit: Payer: No Typology Code available for payment source | Admitting: Oncology

## 2022-12-19 ENCOUNTER — Other Ambulatory Visit: Payer: No Typology Code available for payment source
